# Patient Record
Sex: Male | Born: 1950 | Race: Black or African American | Hispanic: No | Marital: Single | Smoking: Current every day smoker
Health system: Southern US, Community
[De-identification: ages and names within clinical notes are randomized; demographics above are authoritative.]

## PROBLEM LIST (undated history)

## (undated) DIAGNOSIS — I96 Gangrene, not elsewhere classified: Secondary | ICD-10-CM

## (undated) DIAGNOSIS — Z7901 Long term (current) use of anticoagulants: Secondary | ICD-10-CM

## (undated) DIAGNOSIS — Z951 Presence of aortocoronary bypass graft: Secondary | ICD-10-CM

## (undated) DIAGNOSIS — I10 Essential (primary) hypertension: Secondary | ICD-10-CM

## (undated) DIAGNOSIS — I739 Peripheral vascular disease, unspecified: Secondary | ICD-10-CM

## (undated) DIAGNOSIS — M79672 Pain in left foot: Secondary | ICD-10-CM

## (undated) DIAGNOSIS — E119 Type 2 diabetes mellitus without complications: Secondary | ICD-10-CM

## (undated) DIAGNOSIS — I82512 Chronic embolism and thrombosis of left femoral vein: Secondary | ICD-10-CM

## (undated) DIAGNOSIS — N184 Chronic kidney disease, stage 4 (severe): Secondary | ICD-10-CM

## (undated) DIAGNOSIS — M216X2 Other acquired deformities of left foot: Secondary | ICD-10-CM

## (undated) DIAGNOSIS — E782 Mixed hyperlipidemia: Secondary | ICD-10-CM

## (undated) DIAGNOSIS — I2721 Secondary pulmonary arterial hypertension: Secondary | ICD-10-CM

## (undated) DIAGNOSIS — R0602 Shortness of breath: Secondary | ICD-10-CM

## (undated) DIAGNOSIS — R319 Hematuria, unspecified: Secondary | ICD-10-CM

## (undated) DIAGNOSIS — M1A0720 Idiopathic chronic gout, left ankle and foot, without tophus (tophi): Secondary | ICD-10-CM

## (undated) DIAGNOSIS — E1142 Type 2 diabetes mellitus with diabetic polyneuropathy: Secondary | ICD-10-CM

## (undated) DIAGNOSIS — L989 Disorder of the skin and subcutaneous tissue, unspecified: Secondary | ICD-10-CM

## (undated) DIAGNOSIS — Z87891 Personal history of nicotine dependence: Secondary | ICD-10-CM

## (undated) DIAGNOSIS — J9690 Respiratory failure, unspecified, unspecified whether with hypoxia or hypercapnia: Secondary | ICD-10-CM

## (undated) DIAGNOSIS — L84 Corns and callosities: Secondary | ICD-10-CM

## (undated) DIAGNOSIS — IMO0001 Reserved for inherently not codable concepts without codable children: Secondary | ICD-10-CM

## (undated) DIAGNOSIS — E11628 Type 2 diabetes mellitus with other skin complications: Secondary | ICD-10-CM

## (undated) DIAGNOSIS — J449 Chronic obstructive pulmonary disease, unspecified: Secondary | ICD-10-CM

## (undated) DIAGNOSIS — J9691 Respiratory failure, unspecified with hypoxia: Secondary | ICD-10-CM

## (undated) DIAGNOSIS — E118 Type 2 diabetes mellitus with unspecified complications: Secondary | ICD-10-CM

## (undated) DIAGNOSIS — I2510 Atherosclerotic heart disease of native coronary artery without angina pectoris: Secondary | ICD-10-CM

## (undated) DIAGNOSIS — C801 Malignant (primary) neoplasm, unspecified: Secondary | ICD-10-CM

## (undated) DIAGNOSIS — J90 Pleural effusion, not elsewhere classified: Secondary | ICD-10-CM

## (undated) DIAGNOSIS — J189 Pneumonia, unspecified organism: Secondary | ICD-10-CM

## (undated) DIAGNOSIS — N189 Chronic kidney disease, unspecified: Secondary | ICD-10-CM

## (undated) DIAGNOSIS — E039 Hypothyroidism, unspecified: Secondary | ICD-10-CM

## (undated) DIAGNOSIS — J9611 Chronic respiratory failure with hypoxia: Secondary | ICD-10-CM

## (undated) DIAGNOSIS — E7800 Pure hypercholesterolemia, unspecified: Secondary | ICD-10-CM

## (undated) DIAGNOSIS — I70269 Atherosclerosis of native arteries of extremities with gangrene, unspecified extremity: Secondary | ICD-10-CM

## (undated) DIAGNOSIS — D649 Anemia, unspecified: Secondary | ICD-10-CM

## (undated) DIAGNOSIS — D509 Iron deficiency anemia, unspecified: Secondary | ICD-10-CM

## (undated) DIAGNOSIS — I5033 Acute on chronic diastolic (congestive) heart failure: Secondary | ICD-10-CM

## (undated) DIAGNOSIS — D631 Anemia in chronic kidney disease: Secondary | ICD-10-CM

## (undated) DIAGNOSIS — I159 Secondary hypertension, unspecified: Secondary | ICD-10-CM

## (undated) DIAGNOSIS — I509 Heart failure, unspecified: Secondary | ICD-10-CM

## (undated) DIAGNOSIS — D472 Monoclonal gammopathy: Secondary | ICD-10-CM

## (undated) DIAGNOSIS — E11319 Type 2 diabetes mellitus with unspecified diabetic retinopathy without macular edema: Secondary | ICD-10-CM

## (undated) HISTORY — DX: Monoclonal gammopathy: D47.2

## (undated) HISTORY — PX: COLONOSCOPY W/ POLYPECTOMY: SHX1380

## (undated) HISTORY — DX: Essential (primary) hypertension: I10

## (undated) HISTORY — DX: Iron deficiency anemia, unspecified: D50.9

## (undated) HISTORY — PX: TONSILLECTOMY AND ADENOIDECTOMY: SUR1326

---

## 1981-02-03 HISTORY — PX: THROAT SURGERY: SHX803

## 2003-10-05 ENCOUNTER — Ambulatory Visit: Payer: Self-pay | Admitting: Nurse Practitioner

## 2003-10-12 ENCOUNTER — Ambulatory Visit: Payer: Self-pay | Admitting: *Deleted

## 2003-10-23 ENCOUNTER — Ambulatory Visit: Payer: Self-pay | Admitting: Nurse Practitioner

## 2003-11-27 ENCOUNTER — Ambulatory Visit: Payer: Self-pay | Admitting: Internal Medicine

## 2003-12-07 ENCOUNTER — Ambulatory Visit: Payer: Self-pay | Admitting: Nurse Practitioner

## 2004-03-12 ENCOUNTER — Ambulatory Visit: Payer: Self-pay | Admitting: Nurse Practitioner

## 2004-05-29 ENCOUNTER — Ambulatory Visit: Payer: Self-pay | Admitting: Nurse Practitioner

## 2004-08-28 ENCOUNTER — Ambulatory Visit: Payer: Self-pay | Admitting: Internal Medicine

## 2004-10-31 ENCOUNTER — Ambulatory Visit: Payer: Self-pay | Admitting: Nurse Practitioner

## 2004-11-18 ENCOUNTER — Ambulatory Visit: Payer: Self-pay | Admitting: Internal Medicine

## 2004-11-28 ENCOUNTER — Ambulatory Visit: Payer: Self-pay | Admitting: Nurse Practitioner

## 2005-03-05 ENCOUNTER — Ambulatory Visit: Payer: Self-pay | Admitting: Nurse Practitioner

## 2005-05-19 ENCOUNTER — Ambulatory Visit: Payer: Self-pay | Admitting: Internal Medicine

## 2005-08-18 ENCOUNTER — Ambulatory Visit: Payer: Self-pay | Admitting: Internal Medicine

## 2006-03-25 ENCOUNTER — Ambulatory Visit: Payer: Self-pay | Admitting: Nurse Practitioner

## 2006-10-21 ENCOUNTER — Encounter (INDEPENDENT_AMBULATORY_CARE_PROVIDER_SITE_OTHER): Payer: Self-pay | Admitting: *Deleted

## 2007-11-19 ENCOUNTER — Ambulatory Visit: Payer: Self-pay | Admitting: Internal Medicine

## 2007-11-23 ENCOUNTER — Encounter (INDEPENDENT_AMBULATORY_CARE_PROVIDER_SITE_OTHER): Payer: Self-pay | Admitting: Internal Medicine

## 2007-11-23 ENCOUNTER — Ambulatory Visit: Payer: Self-pay | Admitting: Internal Medicine

## 2007-11-23 LAB — CONVERTED CEMR LAB
AST: 16 units/L (ref 0–37)
Albumin: 5 g/dL (ref 3.5–5.2)
Alkaline Phosphatase: 91 units/L (ref 39–117)
BUN: 13 mg/dL (ref 6–23)
Basophils Relative: 0 % (ref 0–1)
Calcium: 9.8 mg/dL (ref 8.4–10.5)
Creatinine, Ser: 0.77 mg/dL (ref 0.40–1.50)
Eosinophils Absolute: 0 10*3/uL (ref 0.0–0.7)
Glucose, Bld: 289 mg/dL — ABNORMAL HIGH (ref 70–99)
HCT: 45.3 % (ref 39.0–52.0)
HDL: 40 mg/dL (ref >39)
Hemoglobin: 15 g/dL (ref 13.0–17.0)
LDL Cholesterol: 108 mg/dL — ABNORMAL HIGH (ref 0–99)
Lymphs Abs: 2.2 10*3/uL (ref 0.7–4.0)
MCHC: 33.1 g/dL (ref 30.0–36.0)
MCV: 85.2 fL (ref 78.0–100.0)
Monocytes Absolute: 0.3 10*3/uL (ref 0.1–1.0)
Monocytes Relative: 5 % (ref 3–12)
Potassium: 3.8 meq/L (ref 3.5–5.3)
RBC: 5.32 M/uL (ref 4.22–5.81)
TSH: 3.384 microintl units/mL (ref 0.350–4.50)
Total CHOL/HDL Ratio: 5.1
Triglycerides: 271 mg/dL — ABNORMAL HIGH (ref <150)
WBC: 5.8 10*3/uL (ref 4.0–10.5)

## 2007-11-26 ENCOUNTER — Ambulatory Visit: Payer: Self-pay | Admitting: Internal Medicine

## 2007-12-20 ENCOUNTER — Encounter (INDEPENDENT_AMBULATORY_CARE_PROVIDER_SITE_OTHER): Payer: Self-pay | Admitting: Internal Medicine

## 2007-12-20 ENCOUNTER — Ambulatory Visit: Payer: Self-pay | Admitting: Internal Medicine

## 2007-12-20 LAB — CONVERTED CEMR LAB
Amphetamine Screen, Ur: NEGATIVE
Cocaine Metabolites: NEGATIVE
Creatinine,U: 144 mg/dL
Microalb, Ur: 51.4 mg/dL — ABNORMAL HIGH (ref 0.00–1.89)
Phencyclidine (PCP): NEGATIVE

## 2007-12-22 ENCOUNTER — Ambulatory Visit: Payer: Self-pay | Admitting: Internal Medicine

## 2008-01-19 ENCOUNTER — Ambulatory Visit: Payer: Self-pay | Admitting: Internal Medicine

## 2008-03-22 ENCOUNTER — Ambulatory Visit: Payer: Self-pay | Admitting: Internal Medicine

## 2008-03-30 ENCOUNTER — Ambulatory Visit: Payer: Self-pay | Admitting: Internal Medicine

## 2008-06-26 ENCOUNTER — Ambulatory Visit: Payer: Self-pay | Admitting: Family Medicine

## 2008-06-27 ENCOUNTER — Ambulatory Visit: Payer: Self-pay | Admitting: Internal Medicine

## 2008-06-28 ENCOUNTER — Encounter: Payer: Self-pay | Admitting: Internal Medicine

## 2008-07-07 ENCOUNTER — Encounter: Payer: Self-pay | Admitting: Internal Medicine

## 2008-07-07 ENCOUNTER — Ambulatory Visit: Payer: Self-pay

## 2008-07-17 ENCOUNTER — Ambulatory Visit: Payer: Self-pay | Admitting: Cardiovascular Disease

## 2008-07-17 DIAGNOSIS — F17200 Nicotine dependence, unspecified, uncomplicated: Secondary | ICD-10-CM

## 2008-07-17 DIAGNOSIS — E782 Mixed hyperlipidemia: Secondary | ICD-10-CM

## 2008-07-17 HISTORY — DX: Mixed hyperlipidemia: E78.2

## 2008-08-16 ENCOUNTER — Ambulatory Visit: Payer: Self-pay | Admitting: Internal Medicine

## 2008-09-13 ENCOUNTER — Encounter (INDEPENDENT_AMBULATORY_CARE_PROVIDER_SITE_OTHER): Payer: Self-pay | Admitting: Internal Medicine

## 2008-09-13 ENCOUNTER — Ambulatory Visit: Payer: Self-pay | Admitting: Internal Medicine

## 2008-09-13 LAB — CONVERTED CEMR LAB: PSA: 0.2 ng/mL (ref 0.10–4.00)

## 2008-10-02 ENCOUNTER — Ambulatory Visit: Payer: Self-pay | Admitting: Internal Medicine

## 2008-12-08 ENCOUNTER — Encounter (INDEPENDENT_AMBULATORY_CARE_PROVIDER_SITE_OTHER): Payer: Self-pay | Admitting: Internal Medicine

## 2008-12-08 ENCOUNTER — Ambulatory Visit: Payer: Self-pay | Admitting: Internal Medicine

## 2008-12-08 LAB — CONVERTED CEMR LAB
HDL: 33 mg/dL — ABNORMAL LOW (ref >39)
Total CHOL/HDL Ratio: 4.3
Triglycerides: 196 mg/dL — ABNORMAL HIGH (ref <150)

## 2009-04-02 ENCOUNTER — Ambulatory Visit: Payer: Self-pay | Admitting: Internal Medicine

## 2009-05-21 ENCOUNTER — Ambulatory Visit: Payer: Self-pay | Admitting: Internal Medicine

## 2009-05-24 ENCOUNTER — Telehealth (INDEPENDENT_AMBULATORY_CARE_PROVIDER_SITE_OTHER): Payer: Self-pay | Admitting: *Deleted

## 2010-03-06 NOTE — Progress Notes (Signed)
Summary: records request  Records request received from the fax machine. Forwarded to [employer] for processing.

## 2012-01-06 ENCOUNTER — Encounter: Payer: Self-pay | Admitting: Family Medicine

## 2012-01-06 ENCOUNTER — Ambulatory Visit (INDEPENDENT_AMBULATORY_CARE_PROVIDER_SITE_OTHER): Payer: Medicaid Other | Admitting: Family Medicine

## 2012-01-06 VITALS — BP 172/82 | HR 72 | Temp 98.8°F | Ht 68.0 in | Wt 213.4 lb

## 2012-01-06 DIAGNOSIS — E1129 Type 2 diabetes mellitus with other diabetic kidney complication: Secondary | ICD-10-CM | POA: Insufficient documentation

## 2012-01-06 DIAGNOSIS — I119 Hypertensive heart disease without heart failure: Secondary | ICD-10-CM

## 2012-01-06 DIAGNOSIS — E119 Type 2 diabetes mellitus without complications: Secondary | ICD-10-CM

## 2012-01-06 MED ORDER — METFORMIN HCL 1000 MG PO TABS: 1000.0000 mg | ORAL_TABLET | Freq: Two times a day (BID) | ORAL | Status: DC

## 2012-01-06 MED ORDER — INSULIN GLARGINE 100 UNIT/ML ~~LOC~~ SOLN: 50.0000 [IU] | Freq: Every day | SUBCUTANEOUS | Status: DC

## 2012-01-06 MED ORDER — GLIPIZIDE 10 MG PO TABS: 10.0000 mg | ORAL_TABLET | Freq: Two times a day (BID) | ORAL | Status: DC

## 2012-01-06 MED ORDER — ATENOLOL 100 MG PO TABS: 100.0000 mg | ORAL_TABLET | Freq: Every day | ORAL | Status: DC

## 2012-01-06 MED ORDER — PRAVASTATIN SODIUM 40 MG PO TABS: 40.0000 mg | ORAL_TABLET | Freq: Every day | ORAL | Status: DC

## 2012-01-06 MED ORDER — LISINOPRIL 40 MG PO TABS: 40.0000 mg | ORAL_TABLET | Freq: Every day | ORAL | Status: DC

## 2012-01-06 MED ORDER — TRIAMTERENE-HCTZ 75-50 MG PO TABS: 1.0000 | ORAL_TABLET | Freq: Every day | ORAL | Status: DC

## 2012-01-06 MED ORDER — CLONIDINE HCL 0.2 MG PO TABS: 0.2000 mg | ORAL_TABLET | Freq: Two times a day (BID) | ORAL | Status: DC

## 2012-01-06 MED ORDER — AMLODIPINE BESYLATE 10 MG PO TABS: 10.0000 mg | ORAL_TABLET | Freq: Every day | ORAL | Status: DC

## 2012-01-06 NOTE — Patient Instructions (Addendum)
It was nice to meet you today, Leonard [name]. Your blood pressure was elevated today.  Remember to pick up your blood pressure medications. Schedule a follow up appointment with me in 3-4 weeks to discuss diabetes management and recheck blood pressure. In the meantime, try to avoid foods high in salt and sugar.  Read handout below.  Diet Recommendations for Diabetes   Starchy (carb) foods include: Bread, rice, pasta, potatoes, corn, crackers, bagels, muffins, all baked goods.   Protein foods include: Meat, fish, poultry, eggs, dairy foods, and beans such as pinto and kidney beans (beans also provide carbohydrate).   1. Eat at least 3 meals and 1-2 snacks per day. Never go more than 4-5 hours while awake without eating.  2. Limit starchy foods to TWO per meal and ONE per snack. ONE portion of a starchy  food is equal to the following:   - ONE slice of bread (or its equivalent, such as half of a hamburger bun).   - 1/2 cup of a "scoopable" starchy food such as potatoes or rice.   - 1 OUNCE (28 grams) of starchy snack foods such as crackers or pretzels (look on label).   - 15 grams of carbohydrate as shown on food label.  3. Both lunch and dinner should include a protein food, a carb food, and vegetables.   - Obtain twice as many veg's as protein or carbohydrate foods for both lunch and dinner.   - Try to keep frozen veg's on hand for a quick vegetable serving.     - Fresh or frozen veg's are best.  4. Breakfast should always include protein.    DASH Diet The DASH diet stands for "Dietary Approaches to Stop Hypertension." It is a healthy eating plan that has been shown to reduce high blood pressure (hypertension) in as little as 14 days, while also possibly providing other significant health benefits. These other health benefits include reducing the risk of breast cancer after menopause and reducing the risk of type 2 diabetes, heart disease, colon cancer, and stroke. Health benefits also include weight  loss and slowing kidney failure in patients with chronic kidney disease.  DIET GUIDELINES  Limit salt (sodium). Your diet should contain less than 1500 mg of sodium daily.  Limit refined or processed carbohydrates. Your diet should include mostly whole grains. Desserts and added sugars should be used sparingly.  Include small amounts of heart-healthy fats. These types of fats include nuts, oils, and tub margarine. Limit saturated and trans fats. These fats have been shown to be harmful in the body. CHOOSING FOODS  The following food groups are based on a 2000 calorie diet. See your Registered Dietitian for individual calorie needs. Grains and Grain Products (6 to 8 servings daily)  Eat More Often: Whole-wheat bread, brown rice, whole-grain or wheat pasta, quinoa, popcorn without added fat or salt (air popped).  Eat Less Often: White bread, white pasta, white rice, cornbread. Vegetables (4 to 5 servings daily)  Eat More Often: Fresh, frozen, and canned vegetables. Vegetables may be raw, steamed, roasted, or grilled with a minimal amount of fat.  Eat Less Often/Avoid: Creamed or fried vegetables. Vegetables in a cheese sauce. Fruit (4 to 5 servings daily)  Eat More Often: All fresh, canned (in natural juice), or frozen fruits. Dried fruits without added sugar. One hundred percent fruit juice ( cup [237 mL] daily).  Eat Less Often: Dried fruits with added sugar. Canned fruit in light or heavy syrup. [employer], Fish, and  Poultry (2 servings or less daily. One serving is 3 to 4 oz [85-114 g]).  Eat More Often: Ninety percent or leaner ground beef, tenderloin, sirloin. Round cuts of beef, chicken breast, [place] breast. All fish. Grill, bake, or broil your meat. Nothing should be fried.  Eat Less Often/Avoid: Fatty cuts of meat, [place], or chicken leg, thigh, or wing. Fried cuts of meat or fish. Dairy (2 to 3 servings)  Eat More Often: Low-fat or fat-free milk, low-fat plain or light  yogurt, reduced-fat or part-skim cheese.  Eat Less Often/Avoid: Milk (whole, 2%).Whole milk yogurt. Full-fat cheeses. Nuts, Seeds, and Legumes (4 to 5 servings per week)  Eat More Often: All without added salt.  Eat Less Often/Avoid: Salted nuts and seeds, canned beans with added salt. Fats and Sweets (limited)  Eat More Often: Vegetable oils, tub margarines without trans fats, sugar-free gelatin. Mayonnaise and salad dressings.  Eat Less Often/Avoid: Coconut oils, palm oils, butter, stick margarine, cream, half and half, cookies, Leonard [name], pie. FOR MORE INFORMATION The Dash Diet Eating Plan: www.dashdiet.org Document Released: 01/09/2011 Document Revised: 04/14/2011 Document Reviewed: 01/09/2011 [facility] Patient Information 2013 [place].

## 2012-01-06 NOTE — Assessment & Plan Note (Signed)
BP elevated in clinic today 172/82.  However, patient has been out of several of his BP medication since [facility] closed. - Will refill all BP medications today - Counseled patient on DASH diet - Recheck BP in 3-4 weeks

## 2012-01-06 NOTE — Assessment & Plan Note (Addendum)
Patient cannot recall last Hemoglobin A1c values and has been out of Metformin and Glipizide x 1 week. - Will review records from [place] - Refill DM medications today - Follow up in 3-4 weeks after he gets approval from [facility], will check A1c then - Counseled patient on CHO modified diet

## 2012-01-06 NOTE — Progress Notes (Signed)
  Subjective:    Patient ID: Leonard [name], male    DOB: August 17, 1950, 61 y.o.   MRN: [id number]  HPI  Patient presents to clinic to establish care.  Former Production manager.  Patient here for medication refills today.  He has been out of several medications x 1 week - he is specifically asking for Lisinopril and Metformin today.  BP elevated today 172/82.  He says he took a few BP medications this AM, but cannot remember which ones.  He says he has been out of Metformin x one week, but still taking Lantus and Glipizide.  Patient does not check CBG at home.  He also does not adhere to a CHO modified diet and cannot recall what his last Hemoglobin A1c was.  He did bring medical records to clinic today.    Patient needs to contact Medicaid case worker to change insurance to [facility].   I have reviewed and update patient's PMH, SH, FH, Sx Hx, Medications, Allergies, and Problem List.  Review of Systems  Patient denies any HA, visual changes, nausea/vomiting, fevers, weight loss, or paresthesias of hands or feet.     Objective:   Physical Exam  Constitutional: No distress.  HENT:  Head: Normocephalic and atraumatic.  Neck: Normal range of motion. Neck supple.  Cardiovascular: Normal rate and regular rhythm.   Pulmonary/Chest: Effort normal and breath sounds normal. He has no wheezes. He has no rales.  Neurological: No cranial nerve deficit.          Assessment & Plan:

## 2012-01-19 ENCOUNTER — Other Ambulatory Visit: Payer: Self-pay | Admitting: *Deleted

## 2012-01-19 MED ORDER — INSULIN GLARGINE 100 UNIT/ML ~~LOC~~ SOLN: 50.0000 [IU] | Freq: Every day | SUBCUTANEOUS | Status: DC

## 2012-04-05 ENCOUNTER — Other Ambulatory Visit: Payer: Self-pay | Admitting: *Deleted

## 2012-04-05 MED ORDER — AMLODIPINE BESYLATE 10 MG PO TABS: 10.0000 mg | ORAL_TABLET | Freq: Every day | ORAL | Status: DC

## 2012-05-15 ENCOUNTER — Other Ambulatory Visit: Payer: Self-pay | Admitting: Family Medicine

## 2012-07-01 ENCOUNTER — Other Ambulatory Visit: Payer: Self-pay | Admitting: Family Medicine

## 2012-07-30 ENCOUNTER — Telehealth: Payer: Self-pay | Admitting: Family Medicine

## 2012-07-30 NOTE — Telephone Encounter (Signed)
Please let patient know what a copy of his MR are at front desk if he wants to pick them up.

## 2012-07-30 NOTE — Telephone Encounter (Signed)
Pt informed.  Leonard [name], Leonard [name], [place]

## 2012-08-05 ENCOUNTER — Telehealth: Payer: Self-pay | Admitting: Family Medicine

## 2012-08-05 ENCOUNTER — Other Ambulatory Visit: Payer: Self-pay | Admitting: Family Medicine

## 2012-08-05 NOTE — Telephone Encounter (Signed)
Pt has come in stating that he is out off medicine and need prescriptions sent to the pharmacy.

## 2012-08-05 NOTE — Telephone Encounter (Signed)
Spoke with patient's wife and informed her to contact pharmacy about the refills

## 2012-08-09 ENCOUNTER — Encounter: Payer: Self-pay | Admitting: Family Medicine

## 2012-08-09 ENCOUNTER — Ambulatory Visit (INDEPENDENT_AMBULATORY_CARE_PROVIDER_SITE_OTHER): Payer: Medicaid Other | Admitting: Family Medicine

## 2012-08-09 VITALS — BP 198/84 | HR 84 | Temp 99.1°F | Ht 68.0 in | Wt 214.1 lb

## 2012-08-09 DIAGNOSIS — I119 Hypertensive heart disease without heart failure: Secondary | ICD-10-CM

## 2012-08-09 DIAGNOSIS — E119 Type 2 diabetes mellitus without complications: Secondary | ICD-10-CM

## 2012-08-09 LAB — GLUCOSE, CAPILLARY: Glucose-Capillary: 226 mg/dL — ABNORMAL HIGH (ref 70–99)

## 2012-08-09 MED ORDER — CLONIDINE HCL 0.2 MG PO TABS: 0.2000 mg | ORAL_TABLET | Freq: Two times a day (BID) | ORAL | Status: DC

## 2012-08-09 MED ORDER — AMLODIPINE BESYLATE 10 MG PO TABS: 10.0000 mg | ORAL_TABLET | Freq: Every day | ORAL | Status: DC

## 2012-08-09 MED ORDER — LISINOPRIL 40 MG PO TABS: 40.0000 mg | ORAL_TABLET | Freq: Every day | ORAL | Status: DC

## 2012-08-09 MED ORDER — ATENOLOL 100 MG PO TABS: ORAL_TABLET | ORAL | Status: DC

## 2012-08-09 MED ORDER — GLIPIZIDE 10 MG PO TABS: 10.0000 mg | ORAL_TABLET | Freq: Two times a day (BID) | ORAL | Status: DC

## 2012-08-09 MED ORDER — TRIAMTERENE-HCTZ 75-50 MG PO TABS: ORAL_TABLET | ORAL | Status: DC

## 2012-08-09 NOTE — Progress Notes (Signed)
Patient ID: Leonard [name], male   DOB: [birth year], 62 y.o.   MRN: [id number]   [facility] Garret Reddish, MD Phone: [phone]  Subjective:    Type 2 diabetes:  1.  He states that he has been complient with his medications. He said he ran out of his glipizide this morning. He does not check his blood sugar. His blood sugar today in clinic was 226 and his hemoglobin A1c was 12.1. He states that he maintain a healthy diet. Patient wants his medications refilled.    HTN:  2. The patient says that he has been taking his medications for HTN. Today his blood pressure was 198/84. He has not had any swelling, headaches, but has had vision problems. Patient wants his medications refilled.    Vision changes 3. He states that he has had recent vision changes in the past month. They occur during the day and night. He has seen an opthalmologist in the past but has not kept up with it.   ROS--See HPI  Past Medical History Patient Active Problem List   Diagnosis Date Noted  . Diabetes mellitus, type 2 01/06/2012  . MIXED HYPERLIPIDEMIA 07/17/2008  . TOBACCO ABUSE 07/17/2008  . HYPERTENSION, HEART UNCONTROLLED W/O ASSOC CHF 07/17/2008  . HYPERTENSION, HEART CONTROLLED W/O ASSOC CHF 07/17/2008   Reviewed problem list.  Medications- reviewed and updated Chief complaint-noted  Objective: BP 198/84  Pulse 84  Temp(Src) 99.1 F (37.3 C) (Oral)  Ht 5\' 8"  (1.727 m)  Wt 214 lb 1.6 oz (97.115 kg)  BMI 32.56 kg/m2   Gen: NAD, resting comfortably CV: RRR no murmurs rubs or gallops Lungs: CTAB no crackles, wheeze, rhonchi Skin: warm, dry Neuro: grossly normal, moves all extremities  Assessment/Plan:  1. All of the patients diabetic meds were refilled. Decided not to change the dosage of any of his medications because compliance is an issue. I told him to check his blood sugar and keep a record.  We will do this for a month and he will schedule a return visit. I  prescribed him an accu-chick with strips so he can monitor his blood sugar at home. The patient could benefit from diabetic education with Dr. Valentina Lucks so that is something we could do in the future.   2. I refilled all of his HTN medications.  The dosages were not changed because I don't know his level of compliance.  I encouraged a return visit in a month so we'll be able to recheck his BP and also be able to check his fasting labs.  CMET, CBC, Fasting lipids.   3. He was referred to an opthalmologist so he can get his vision checked.

## 2012-08-09 NOTE — Patient Instructions (Addendum)
I have refilled all of your prescriptions and referred you to get your eyes checked.   You need to check your blood sugar before meals and I have ordered an Accu check system so that you may do that. Your blood sugar today was 226 and your A1C (average over the past 3 months) was 12.1.   Please take your medication as directed and schedule a f/u visit within a month so we can recheck your blood sugar and your blood pressure so we can make any adjustments if necessary. We will perform labs on your return visit so make sure that you are fasting when you come in.    Thank you and have a nice day,  Clearance Coots, MD   For your diet:   Exercise such that you sweat some and your heart rate goes up most days of the week.  4Water, water, water  Check blood sugar 2-3 x per week to get a feel for how different foods effect your blood sugar:  Goal fasting <100  Goal after eating < 200  Diet Recommendations for Diabetes   Starchy (carb) foods include: Bread, rice, pasta, potatoes, corn, crackers, bagels, muffins, all baked goods.   Protein foods include: Meat, fish, poultry, eggs, dairy foods, and beans such as pinto and kidney beans (beans also provide carbohydrate).   1. Eat at least 3 meals and 1-2 snacks per day. Never go more than 4-5 hours while awake without eating.  2. Limit starchy foods to TWO per meal and ONE per snack. ONE portion of a starchy  food is equal to the following:   - ONE slice of bread (or its equivalent, such as half of a hamburger bun).   - 1/2 cup of a "scoopable" starchy food such as potatoes or rice.   - 1 OUNCE (28 grams) of starchy snack foods such as crackers or pretzels (look on label).   - 15 grams of carbohydrate as shown on food label.  3. Both lunch and dinner should include a protein food, a carb food, and vegetables.   - Obtain twice as many veg's as protein or carbohydrate foods for both lunch and dinner.   - Try to keep frozen veg's on hand for a quick  vegetable serving.     - Fresh or frozen veg's are best.  4. Breakfast should always include protein.    Diet Recommendations for Diabetes   Starchy (carb) foods include: Bread, rice, pasta, potatoes, corn, crackers, bagels, muffins, all baked goods.   Protein foods include: Meat, fish, poultry, eggs, dairy foods, and beans such as pinto and kidney beans (beans also provide carbohydrate).   1. Eat at least 3 meals and 1-2 snacks per day. Never go more than 4-5 hours while awake without eating.  2. Limit starchy foods to TWO per meal and ONE per snack. ONE portion of a starchy  food is equal to the following:   - ONE slice of bread (or its equivalent, such as half of a hamburger bun).   - 1/2 cup of a "scoopable" starchy food such as potatoes or rice.   - 1 OUNCE (28 grams) of starchy snack foods such as crackers or pretzels (look on label).   - 15 grams of carbohydrate as shown on food label.  3. Both lunch and dinner should include a protein food, a carb food, and vegetables.   - Obtain twice as many veg's as protein or carbohydrate foods for both lunch and dinner.   -  Try to keep frozen veg's on hand for a quick vegetable serving.     - Fresh or frozen veg's are best.  4. Breakfast should always include protein.

## 2012-09-07 ENCOUNTER — Other Ambulatory Visit: Payer: Self-pay | Admitting: Family Medicine

## 2012-09-07 DIAGNOSIS — E876 Hypokalemia: Secondary | ICD-10-CM

## 2012-09-22 ENCOUNTER — Encounter: Payer: Medicaid Other | Attending: Family Medicine | Admitting: *Deleted

## 2012-09-22 ENCOUNTER — Encounter: Payer: Self-pay | Admitting: *Deleted

## 2012-09-22 VITALS — Ht 68.0 in | Wt 213.7 lb

## 2012-09-22 DIAGNOSIS — Z713 Dietary counseling and surveillance: Secondary | ICD-10-CM | POA: Insufficient documentation

## 2012-09-22 DIAGNOSIS — E119 Type 2 diabetes mellitus without complications: Secondary | ICD-10-CM | POA: Insufficient documentation

## 2012-09-22 NOTE — Addendum Note (Signed)
Addended by: Rosemarie Ax on: 09/22/2012 02:19 PM   Modules accepted: Orders

## 2012-09-22 NOTE — Progress Notes (Signed)
Medical Nutrition Therapy:  Appt start time:  900   End time: 1000  Assessment:  Patient was seen on  09/22/2012 for individual diabetes education. Had education years ago and does not recall much. Knows to "keep carbohydrates down". States his sister does the cooking and recall reveals trend of excessive intake of fried/high fat foods and CHO. Is not checking blood glucose and reports he does not own a meter.  Will request referral from MD for BGM. Followed by foot doctor and gave referral to [facility] for diabetic eye exam.  Extremely distracted during appointment and appeared eager to leave, though I believe he will return for meter instruction. Smokes cigarettes (1 ppd) and states interest in quitting.  Asks for prescription "or something to help me quit". Referred back to MD for Rx.   DM Self-Mgmt Assessment 09/22/2012  Time since Diabetes Dx 13 years  Type of Diabetes Type 2  Previous DM Education? Yes  Location of education [employer] via [place]  Work/school missed in year for illness On disability  Personal health rating (1 = best) 3  How often is MD seen for diabetes? Every 3 months  Hospitalizations in past year 0  Do you check your glucose? No  Do you check your feet? Yes  How often? Sees Dr. Blenda Mounts at [facility]; Daily at home  Do you exercise? Yes  How often? Walks 30-60 min most days  Do you take aspirin? Yes  How often? Daily  Do you use alcohol? No  Do you use tobacco? Yes  How often? 1 ppd (cigarettes) - states interest to quit  Eye exam within a year? No  Do you follow a meal plan? No   Current HbA1c: 12.1% (08/09/12)  MEDICATIONS: See med list. Reconciled with patient at visit.   DIETARY INTAKE:  Usual eating pattern includes 2-3 meals and 0-1 snacks per day.  24-hr recall:  B (10 AM):  Fried chicken (1 leg), 1 pc bologna w/ 1 pc white bread; diet orange soda  Snk (AM): NONE  L (2-3 PM): Cubed steak, 1 cup creamed potatoes Snk (PM): NONE D  (PM): Ham, cabbage, beans Snk (PM): Cookies (4 ea) med-sized, chocolate chip Beverages: Diet soda  Estimated energy needs: 1600 calories 180 g carbohydrates  Progress Towards Goal(s):  In progress.   Nutritional Diagnosis:  [place]-2.1 Impaired nutrient utilization related to glucose metabolism as evidenced by recent A1c of 12% and MD referral for MNT/DSME.    Intervention:  Nutrition counseling provided.  Provided education on macronutrients on glucose levels.  Provided education on carb counting, importance of regularly scheduled meals/snacks, and meal planning  Discussed effects of physical activity on glucose levels and long-term glucose control.  Recommended 45-60 minutes of physical activity/day.  Reviewed patient medications.  Discussed role of medication on blood glucose and possible side effects  Discussed blood glucose monitoring and interpretation.  Discussed recommended target ranges and individual ranges.    Described short-term complications: hyper- and hypo-glycemia.  Discussed causes,symptoms, and treatment options.  Discussed prevention, detection, and treatment of long-term complications.  Discussed the role of prolonged elevated glucose levels on body systems.  Discussed recommendations for long-term diabetes self-care.  Established checklist for medical, dental, and emotional self-care.  Not covered d/t time constraints/patient education level. Will cover at f/u:   Discussed diabetes disease process and treatment options.  Discussed physiology of diabetes and role of obesity on insulin resistance.  Encouraged moderate weight reduction to improve glucose levels.  Discussed  role of medications and diet in glucose control.  Discussed role of stress on blood glucose levels and discussed strategies to manage psychosocial issues.  Will provide continued education on carb counting  Handouts given during visit include:  Living Well with Diabetes book ([employer])  Target  Blood Glucose Levels  45g CHO Meal Plan  Yellow Meal Planning card  Barriers to learning/adherance to lifestyle change:  Education level, transportation, Socioeconomic status  Diabetes self-care support plan:  [facility] support group  Referral from MD for BGM  Monitoring/Evaluation:  Dietary intake, exercise, A1c/BG trends, and body weight in 4 week(s).

## 2012-09-22 NOTE — Patient Instructions (Addendum)
Goals:  Follow Diabetes Meal Plan as instructed  Eat 3 meals and 2 snacks, every 3-5 hrs  Limit carbohydrate intake to 45 grams carbohydrate/meal  Limit carbohydrate intake to 15 grams carbohydrate/snack  Add lean protein foods to meals/snacks  Monitor glucose levels as instructed by your doctor  Aim for 60 mins of physical activity daily

## 2012-10-12 ENCOUNTER — Encounter: Payer: Medicaid Other | Attending: Family Medicine | Admitting: *Deleted

## 2012-10-12 ENCOUNTER — Encounter: Payer: Self-pay | Admitting: *Deleted

## 2012-10-12 DIAGNOSIS — Z713 Dietary counseling and surveillance: Secondary | ICD-10-CM | POA: Insufficient documentation

## 2012-10-12 DIAGNOSIS — E119 Type 2 diabetes mellitus without complications: Secondary | ICD-10-CM | POA: Insufficient documentation

## 2012-10-12 NOTE — Progress Notes (Addendum)
Appt start time: [id number] end time:  [id number].  Assessment:  Patient seen 10/12/12 for continued diabetes education and instruction on Blood Glucose Monitoring with teach back method. Patient noted that he had not made any dietary modifications since his initial visit with the dietitian. I noted that Dr. Raeford Razor wanted to see him back at [facility] one month after he started the dietary modifications and glucose testing. He agreed to begin modifications and testing.  Meter Provided: Yes  If Yes, Brand: Accu Chek Nano BG Monitoring Kit Lot #: [id number] Expiration Date: 01/02/14     Intervention:     Explained rationale of testing BG to obtain data as to how their diabetes is being managed.  Provided Target Ranges for both pre and post meals FBS <100, 2hpp <200  Explained factors that effect BG including food (carbohydrate), stress, activity level and insulin availability in the body including diabetes medications  Taught patient techniques for using BG monitor and lancing device  Discussed need for Rx for strips and lancets (I will call family practice)   Explained rationale of recording BG both for patient and MD to assess patterns as needed.  Encouraged moderate weight reduction to improve glucose levels.  Discussed role of medications and diet in glucose control  Provided education on macronutrients on glucose levels.  Provided education on carb counting, importance of regularly scheduled meals/snacks, and meal planning  Discussed effects of physical activity on glucose levels and long-term glucose control.  Recommended 150 minutes of physical activity/week.  Discussed role of stress on blood glucose levels and discussed strategies to manage psychosocial issues.  Handouts given during visit include:  Snack sheet  Follow Up: Patient to follow up with [facility] in one month.   10/15/12 12:26pm Left msg on recording of family practice [phone] request order to be called in to [place]. Test strips and FastClix lancets for 4Xdaily testing.

## 2012-10-18 ENCOUNTER — Other Ambulatory Visit: Payer: Self-pay | Admitting: *Deleted

## 2012-10-18 MED ORDER — ACCU-CHEK FASTCLIX LANCET KIT: 1.0000 | PACK | Freq: Four times a day (QID) | Status: DC

## 2012-10-22 ENCOUNTER — Telehealth: Payer: Self-pay | Admitting: Family Medicine

## 2012-10-22 NOTE — Telephone Encounter (Signed)
Pt dropped off paper work to be filled out concerning diabetic shoes.

## 2012-10-26 ENCOUNTER — Ambulatory Visit: Payer: Medicaid Other | Admitting: *Deleted

## 2012-11-05 ENCOUNTER — Other Ambulatory Visit: Payer: Self-pay | Admitting: Family Medicine

## 2012-11-05 DIAGNOSIS — E119 Type 2 diabetes mellitus without complications: Secondary | ICD-10-CM

## 2012-11-11 MED ORDER — GLIPIZIDE 10 MG PO TABS: 10.0000 mg | ORAL_TABLET | Freq: Two times a day (BID) | ORAL | Status: DC

## 2012-11-11 NOTE — Telephone Encounter (Signed)
Will refill Leonard [name]'s glipizide but have yet to see him back since our initial meeting. Follow up is a concern so will refill since I would not want him to run out of his medication.

## 2012-11-16 ENCOUNTER — Ambulatory Visit (INDEPENDENT_AMBULATORY_CARE_PROVIDER_SITE_OTHER): Payer: Medicaid Other

## 2012-11-16 VITALS — BP 186/102 | HR 96 | Resp 12 | Ht 68.0 in | Wt 213.0 lb

## 2012-11-16 DIAGNOSIS — E1140 Type 2 diabetes mellitus with diabetic neuropathy, unspecified: Secondary | ICD-10-CM

## 2012-11-16 DIAGNOSIS — E1142 Type 2 diabetes mellitus with diabetic polyneuropathy: Secondary | ICD-10-CM

## 2012-11-16 DIAGNOSIS — L97509 Non-pressure chronic ulcer of other part of unspecified foot with unspecified severity: Secondary | ICD-10-CM

## 2012-11-16 DIAGNOSIS — E1149 Type 2 diabetes mellitus with other diabetic neurological complication: Secondary | ICD-10-CM

## 2012-11-16 MED ORDER — CEPHALEXIN 500 MG PO CAPS
500.0000 mg | ORAL_CAPSULE | Freq: Four times a day (QID) | ORAL | Status: DC
Start: 2012-11-16 — End: 2013-01-04

## 2012-11-16 NOTE — Patient Instructions (Signed)
ANTIBACTERIAL SOAP INSTRUCTIONS  THE DAY AFTER PROCEDURE  Please follow the instructions your doctor has marked.   Shower as usual. Before getting out, place a drop of antibacterial liquid soap (Dial) on a wet, clean washcloth.  Gently wipe washcloth over affected area.  Afterward, rinse the area with warm water.  Blot the area dry with a soft cloth and cover with antibiotic ointment (neosporin, polysporin, bacitracin) and band aid or gauze and tape Place 3-4 drops of antibacterial liquid soap in a quart of warm tap water.  Submerge foot into water for 20 minutes.  If bandage was applied after your procedure, leave on to allow for easy lift off, then remove and continue with soak for the remaining time.  Next, blot area dry with a soft cloth and cover with a bandage.  Apply other medications as directed by your doctor, such as cortisporin otic solution (eardrops) or neosporin antibiotic ointment     Diabetes and Foot Care Diabetes may cause you to have a poor blood supply (circulation) to your legs and feet. Because of this, the skin may be thinner, break easier, and heal more slowly. You also may have nerve damage in your legs and feet causing decreased feeling. You may not notice minor injuries to your feet that could lead to serious problems or infections. Taking care of your feet is one of the most important things you can do for yourself.  HOME CARE INSTRUCTIONS  Do not go barefoot. Bare feet are easily injured.  Check your feet daily for blisters, cuts, and redness.  Wash your feet with warm water (not hot) and mild soap. Pat your feet and between your toes until completely dry.  Apply a moisturizing lotion that does not contain alcohol or petroleum jelly to the dry skin on your feet and to dry brittle toenails. Do not put it between your toes.  Trim your toenails straight across. Do not dig under them or around the cuticle.  Do not cut corns or calluses, or try to remove them  with medicine.  Wear clean cotton socks or stockings every day. Make sure they are not too tight. Do not wear knee high stockings since they may decrease blood flow to your legs.  Wear leather shoes that fit properly and have enough cushioning. To break in new shoes, wear them just a few hours a day to avoid injuring your feet.  Wear shoes at all times, even in the house.  Do not cross your legs. This may decrease the blood flow to your feet.  If you find a minor scrape, cut, or break in the skin on your feet, keep it and the skin around it clean and dry. These areas may be cleansed with mild soap and water. Do not use peroxide, alcohol, iodine or Merthiolate.  When you remove an adhesive bandage, be sure not to harm the skin around it.  If you have a wound, look at it several times a day to make sure it is healing.  Do not use heating pads or hot water bottles. Burns can occur. If you have lost feeling in your feet or legs, you may not know it is happening until it is too late.  Report any cuts, sores or bruises to your caregiver. Do not wait! SEEK MEDICAL CARE IF:   You have an injury that is not healing or you notice redness, numbness, burning, or tingling.  Your feet always feel cold.  You have pain or cramps in  your legs and feet. SEEK IMMEDIATE MEDICAL CARE IF:   There is increasing redness, swelling, or increasing pain in the wound.  There is a red line that goes up your leg.  Pus is coming from a wound.  You develop an unexplained oral temperature above 102 F (38.9 C), or as your caregiver suggests.  You notice a bad smell coming from an ulcer or wound. MAKE SURE YOU:   Understand these instructions.  Will watch your condition.  Will get help right away if you are not doing well or get worse. Document Released: 01/18/2000 Document Revised: 04/14/2011 Document Reviewed: 07/26/2008 [facility] Patient Information 2014 [place], [place].

## 2012-11-16 NOTE — Progress Notes (Signed)
  Subjective:    Patient ID: Leonard [name], male    DOB: 06/28/1950, 62 y.o.   MRN: [id number]  HPI Comments: '' THE RT. FOOT HAVE DRAINIGE AND IT OPEN BACK UP''  bursa drainage and odor coming from the ulcer sub-first MTP area right foot. This of them going on for a couple of weeks. Patient was also examined today his blood pressure is significantly elevated. Apparently  has run out of his medications and is scheduled for followup with his primary physician. Advised to keep his appointment with him next week or so with his primary physician, and to contact pharmacy to request a refill of his blood pressure medications with his primary physician.   Review of Systems  Constitutional: Negative.   HENT: Negative.   Eyes: Negative.   Respiratory: Negative.   Cardiovascular: Negative.   Musculoskeletal: Negative.   Skin: Negative.   Allergic/Immunologic: Negative.   Neurological: Negative.   Hematological: Negative.   Psychiatric/Behavioral: Negative.        Objective:   Physical Exam  Constitutional: He is oriented to person, place, and time. He appears well-developed and well-nourished.  Cardiovascular:  Pulses:      Dorsalis pedis pulses are 2+ on the right side, and 2+ on the left side.       Posterior tibial pulses are 0 on the right side, and 1+ on the left side.  Capillary refill 3-4 seconds all digits. Skin temperature warm. No varicosities noted.  Musculoskeletal:  Rectus foot type bilateral. Semirigid digital contractures 2 through 5 bilateral.  Neurological: He is alert and oriented to person, place, and time. He has normal strength. He displays abnormal reflex.  Profoundly decreased epicritic and proprioceptive sensations bilateral DTRs not elicited. There is normal plantar response.  Skin: Skin is warm. No cyanosis. Nails show no clubbing.  Nails thick friable criptotic with discoloration darkening and brittleness. X10.  Ulceration sub-fifth MP. Down to dermal level  0.5 cm in diameter with hemorrhage a keratosis surrounding the periphery. There is a larger ulcer proximally 1.4 x 1.0 cm sub-first MTP area right. Proximally 0.5 cm in depth with a pink granular base down to subcutaneous tissue level. Prior to debridement of the ulcer there is maceration and malodor.  Psychiatric: He has a normal mood and affect. His behavior is normal.          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 The ulcers are debrided to the subcutaneous tissue sub-one dermal level sub-5. Iodosorb and gauze dressings are applied. Prescription for cephalexin is called in to pharmacy. 500 mg 4 times a day x10 days. Patient will continue with Silvadene and gauze dressings daily as and instructed. Recheck in 2-3 weeks for followup. Contacted in changes or exacerbations or fever and chills.  Harriet Masson DPM

## 2012-11-25 ENCOUNTER — Ambulatory Visit (INDEPENDENT_AMBULATORY_CARE_PROVIDER_SITE_OTHER): Payer: Medicaid Other | Admitting: Family Medicine

## 2012-11-25 ENCOUNTER — Encounter: Payer: Self-pay | Admitting: Family Medicine

## 2012-11-25 ENCOUNTER — Ambulatory Visit (HOSPITAL_COMMUNITY)
Admission: RE | Admit: 2012-11-25 | Discharge: 2012-11-25 | Disposition: A | Discharge status: Home / Self Care | Payer: Medicaid Other | Source: Ambulatory Visit | Attending: Family Medicine | Admitting: Family Medicine

## 2012-11-25 VITALS — BP 190/108 | HR 80 | Ht 68.0 in | Wt 213.0 lb

## 2012-11-25 DIAGNOSIS — I10 Essential (primary) hypertension: Secondary | ICD-10-CM

## 2012-11-25 DIAGNOSIS — H538 Other visual disturbances: Secondary | ICD-10-CM

## 2012-11-25 DIAGNOSIS — I119 Hypertensive heart disease without heart failure: Secondary | ICD-10-CM

## 2012-11-25 DIAGNOSIS — I1A0 Resistant hypertension: Secondary | ICD-10-CM | POA: Insufficient documentation

## 2012-11-25 DIAGNOSIS — E119 Type 2 diabetes mellitus without complications: Secondary | ICD-10-CM

## 2012-11-25 DIAGNOSIS — F17200 Nicotine dependence, unspecified, uncomplicated: Secondary | ICD-10-CM

## 2012-11-25 DIAGNOSIS — E782 Mixed hyperlipidemia: Secondary | ICD-10-CM

## 2012-11-25 DIAGNOSIS — Z23 Encounter for immunization: Secondary | ICD-10-CM

## 2012-11-25 HISTORY — DX: Resistant hypertension: I1A.0

## 2012-11-25 HISTORY — DX: Essential (primary) hypertension: I10

## 2012-11-25 LAB — LIPID PANEL
Cholesterol: 147 mg/dL (ref 0–200)
LDL Cholesterol: 40 mg/dL (ref 0–99)
Total CHOL/HDL Ratio: 5.4 Ratio
Triglycerides: 400 mg/dL — ABNORMAL HIGH (ref <150)
VLDL: 80 mg/dL — ABNORMAL HIGH (ref 0–40)

## 2012-11-25 LAB — COMPREHENSIVE METABOLIC PANEL
ALT: 29 U/L (ref 0–53)
AST: 23 U/L (ref 0–37)
Albumin: 4.3 g/dL (ref 3.5–5.2)
Alkaline Phosphatase: 72 U/L (ref 39–117)
BUN: 16 mg/dL (ref 6–23)
CO2: 27 mEq/L (ref 19–32)
Calcium: 9.6 mg/dL (ref 8.4–10.5)
Chloride: 104 mEq/L (ref 96–112)
Creat: 0.98 mg/dL (ref 0.50–1.35)
Potassium: 4.2 mEq/L (ref 3.5–5.3)
Total Protein: 6.7 g/dL (ref 6.0–8.3)

## 2012-11-25 MED ORDER — TRIAMTERENE-HCTZ 75-50 MG PO TABS: ORAL_TABLET | ORAL | Status: DC

## 2012-11-25 MED ORDER — GLIPIZIDE 10 MG PO TABS: 10.0000 mg | ORAL_TABLET | Freq: Two times a day (BID) | ORAL | Status: DC

## 2012-11-25 MED ORDER — PRAVASTATIN SODIUM 40 MG PO TABS: 40.0000 mg | ORAL_TABLET | Freq: Every day | ORAL | Status: DC

## 2012-11-25 MED ORDER — CLONIDINE HCL 0.2 MG PO TABS: 0.2000 mg | ORAL_TABLET | Freq: Two times a day (BID) | ORAL | Status: DC

## 2012-11-25 MED ORDER — INSULIN GLARGINE 100 UNIT/ML ~~LOC~~ SOLN: 50.0000 [IU] | Freq: Every day | SUBCUTANEOUS | Status: DC

## 2012-11-25 MED ORDER — AMLODIPINE BESYLATE 10 MG PO TABS: 10.0000 mg | ORAL_TABLET | Freq: Every day | ORAL | Status: DC

## 2012-11-25 MED ORDER — ATENOLOL 100 MG PO TABS: ORAL_TABLET | ORAL | Status: DC

## 2012-11-25 MED ORDER — METFORMIN HCL 1000 MG PO TABS: 1000.0000 mg | ORAL_TABLET | Freq: Two times a day (BID) | ORAL | Status: DC

## 2012-11-25 NOTE — Assessment & Plan Note (Signed)
Will attain a lipid panel  - refill his medication  - will inform him of the results and any changes

## 2012-11-25 NOTE — Patient Instructions (Signed)
Thank you for coming in,   Please take your medications as directed. I have refilled all of your medications. Please schedule an appointment with me prior to your medications running out. This is important due to your elevated blood sugars and high blood pressure.   Please check your blood sugars three times a day before meals. It is important because this will determine if we need to change your medication.  Please bring your blood sugar measurements in with you the next time.    It is also important to check your blood pressure once a week. We may need to change your blood pressure medications.   I will call you with your lab results, eye exam and of your EKG.   Please think about quitting smoking.  This is very important for your health.  There are many things we can do to help you quit.   You can also call 1-800-QUIT-NOW (504)127-7363) for free smoking cessation counseling. For right now I would advise using the gum or the patch. These two things can be found over the counter. If these two things don't work then we can try a different method.     Please feel free to call with any questions or concerns at any time, at [phone]. --Dr. Raeford Razor

## 2012-11-25 NOTE — Assessment & Plan Note (Signed)
A1C: 9.6 which is a decrease from 12.1 in July. Non compliant with blood sugar measurements. Is compliant with medications if he has them.  - faxed form for prior authorization of diabetic shoes and insoles - confirmed referral to podiatrist for further ulcer and foot management.  - already referred to diabetic education and counseling in July. He doesn't seem interested in managing his diabetes.  - refilled his medications.  - expressed the need to check blood sugars three times a day before meals and bring theses measurements at next visit.  - follow up in three months.

## 2012-11-25 NOTE — Assessment & Plan Note (Signed)
BP 190/102. He ran out of his medication. Not interested in check his blood pressure.  - refilled his medications  - EKG performed today and is NSR, has not had one since 2009  - CMET today: on lisinopril and hasn't been checked in over a year.  - consider referral to Dr. Graylin [facility] for ambulatory BP monitoring but doubt this will happen due to compliance issues and lack of follow up.

## 2012-11-25 NOTE — Progress Notes (Signed)
Subjective:     Patient ID: Leonard [name], male   DOB: 1950/10/12, 62 y.o.   MRN: [id number]  HPI  1. HTN: BP is elevated today. He ran out of his medications recently. He walks routinely. He doesn't check his blood pressure at all. He doesn't follow a diet. He reports no shortness of breath, chest pain or headaches.   2. DM: His Hgb A1c 9.6 down from 12.1 in July. He has ran out of his medication and has not taken them recently. He was sent for diabetic couseling and teaching. He went to two classes but still does not check his blood sugar at all. He understands that he needs to do this but is not compliant. He reports no foot pain or paresthesia. He denies frequent urination or increased fatigue. He is also requesting a prior authorization for prior approval for diabetic shoes and insoles. He does not report any problems with walking but does report a ulcer on the bottom of his foot.  He has recently seen a podiatrist that prescribed him keflex and sulfasalazine.  He reports no drainage from the site.   3. Smoking cessation: he wants to quit smoking. He has been a smoker since he was 62 year old. He smokes a PPD. He thinks that it is bad for his health. He doesn't have a quit date but wants to quit. He has not tried any methods to quit in terms of gum or patch.   4. HLD: patient is out of his statin medication. He has not taken it in a couple of weeks.   Review of Systems Positive for blurry vision but all other systems reviewed and otherwise normal.     Objective:   Physical Exam BP 190/108  Pulse 80  Ht 5\' 8"  (1.727 m)  Wt 213 lb (96.616 kg)  BMI 32.39 kg/m2  Gen: NAD, alert, cooperative with exam,  CV: RRR, good S1/S2, no murmur Resp: CTABL, no wheezes, non-labored Ext: No edema, stage 2 ulcer 69mm x 79mm circular on his plantar aspect of right forefoot as the base of the 1st metatarsal head.  No ulcers noted on his left foot.  He has intact sensation of his feet bilaterally. Pulses  are +2 bilaterally in LE and feet.     Assessment:     1. HTN 2. T2DM  3. Smoking counseling.   4. HLD.     Plan:     See problem based charting.

## 2012-11-25 NOTE — Assessment & Plan Note (Addendum)
Complains a worsening blurry vision and also at his visit in July. He was referred to an ophthalmologist in July but he never went.  - a diabetic eye exam was performed here but was unable to quantify his sight because it was so poor.  - can consider referral again but his compliance is always an issue.

## 2012-11-25 NOTE — Assessment & Plan Note (Signed)
Patient expressed interest in quitting. Question to his motivation. He didn't express a quit date. His only reason was that it is bad for his health.  - he should try to the gum or patch first  - if unsuccessful then proceed to possible medications.

## 2012-11-30 ENCOUNTER — Other Ambulatory Visit: Payer: Self-pay | Admitting: Family Medicine

## 2012-11-30 DIAGNOSIS — E119 Type 2 diabetes mellitus without complications: Secondary | ICD-10-CM

## 2012-11-30 MED ORDER — INSULIN PEN NEEDLE 31G X 8 MM MISC: Status: DC

## 2012-12-27 ENCOUNTER — Telehealth: Payer: Self-pay | Admitting: Family Medicine

## 2012-12-27 NOTE — Telephone Encounter (Signed)
Pt called and would like refill on his lisinopril called in . jw

## 2012-12-28 ENCOUNTER — Other Ambulatory Visit: Payer: Self-pay | Admitting: Family Medicine

## 2012-12-28 DIAGNOSIS — I119 Hypertensive heart disease without heart failure: Secondary | ICD-10-CM

## 2012-12-28 MED ORDER — LISINOPRIL 40 MG PO TABS: 40.0000 mg | ORAL_TABLET | Freq: Every day | ORAL | Status: DC

## 2012-12-28 NOTE — Telephone Encounter (Signed)
Spoke with patient and informed him of below 

## 2013-01-04 ENCOUNTER — Inpatient Hospital Stay (HOSPITAL_COMMUNITY)
Admission: EM | Admit: 2013-01-04 | Discharge: 2013-01-07 | DRG: 638 | Disposition: A | Discharge status: Home / Self Care | Payer: Medicaid Other | Attending: Family Medicine | Admitting: Family Medicine

## 2013-01-04 ENCOUNTER — Encounter (HOSPITAL_COMMUNITY): Payer: Self-pay | Admitting: Emergency Medicine

## 2013-01-04 ENCOUNTER — Emergency Department (HOSPITAL_COMMUNITY): Payer: Medicaid Other

## 2013-01-04 DIAGNOSIS — Z794 Long term (current) use of insulin: Secondary | ICD-10-CM

## 2013-01-04 DIAGNOSIS — Z7982 Long term (current) use of aspirin: Secondary | ICD-10-CM

## 2013-01-04 DIAGNOSIS — E119 Type 2 diabetes mellitus without complications: Secondary | ICD-10-CM

## 2013-01-04 DIAGNOSIS — F17200 Nicotine dependence, unspecified, uncomplicated: Secondary | ICD-10-CM | POA: Diagnosis present

## 2013-01-04 DIAGNOSIS — L97509 Non-pressure chronic ulcer of other part of unspecified foot with unspecified severity: Secondary | ICD-10-CM | POA: Diagnosis present

## 2013-01-04 DIAGNOSIS — I10 Essential (primary) hypertension: Secondary | ICD-10-CM | POA: Diagnosis present

## 2013-01-04 DIAGNOSIS — I119 Hypertensive heart disease without heart failure: Secondary | ICD-10-CM

## 2013-01-04 DIAGNOSIS — I96 Gangrene, not elsewhere classified: Secondary | ICD-10-CM | POA: Diagnosis present

## 2013-01-04 DIAGNOSIS — E11628 Type 2 diabetes mellitus with other skin complications: Secondary | ICD-10-CM

## 2013-01-04 DIAGNOSIS — IMO0002 Reserved for concepts with insufficient information to code with codable children: Principal | ICD-10-CM | POA: Diagnosis present

## 2013-01-04 DIAGNOSIS — D649 Anemia, unspecified: Secondary | ICD-10-CM | POA: Diagnosis present

## 2013-01-04 DIAGNOSIS — E1165 Type 2 diabetes mellitus with hyperglycemia: Principal | ICD-10-CM | POA: Diagnosis present

## 2013-01-04 DIAGNOSIS — E875 Hyperkalemia: Secondary | ICD-10-CM | POA: Diagnosis present

## 2013-01-04 DIAGNOSIS — E782 Mixed hyperlipidemia: Secondary | ICD-10-CM | POA: Diagnosis present

## 2013-01-04 LAB — POCT I-STAT, CHEM 8
Calcium, Ion: 1.26 mmol/L (ref 1.13–1.30)
Chloride: 106 mEq/L (ref 96–112)
Creatinine, Ser: 1.4 mg/dL — ABNORMAL HIGH (ref 0.50–1.35)
HCT: 35 % — ABNORMAL LOW (ref 39.0–52.0)
Sodium: 136 mEq/L (ref 135–145)
TCO2: 21 mmol/L (ref 0–100)

## 2013-01-04 LAB — GLUCOSE, CAPILLARY
Glucose-Capillary: 258 mg/dL — ABNORMAL HIGH (ref 70–99)
Glucose-Capillary: 164 mg/dL — ABNORMAL HIGH (ref 70–99)

## 2013-01-04 LAB — CBC WITH DIFFERENTIAL/PLATELET
Basophils Absolute: 0 10*3/uL (ref 0.0–0.1)
Eosinophils Absolute: 0.1 10*3/uL (ref 0.0–0.7)
Eosinophils Relative: 2 % (ref 0–5)
HCT: 32.2 % — ABNORMAL LOW (ref 39.0–52.0)
Hemoglobin: 10.6 g/dL — ABNORMAL LOW (ref 13.0–17.0)
Lymphs Abs: 1.5 10*3/uL (ref 0.7–4.0)
MCH: 27.4 pg (ref 26.0–34.0)
MCV: 83.2 fL (ref 78.0–100.0)
Monocytes Absolute: 0.6 10*3/uL (ref 0.1–1.0)
Monocytes Relative: 8 % (ref 3–12)
Neutrophils Relative %: 68 % (ref 43–77)
RBC: 3.87 MIL/uL — ABNORMAL LOW (ref 4.22–5.81)

## 2013-01-04 LAB — BASIC METABOLIC PANEL
Calcium: 10.4 mg/dL (ref 8.4–10.5)
Chloride: 98 mEq/L (ref 96–112)
Creatinine, Ser: 1.33 mg/dL (ref 0.50–1.35)
GFR calc Af Amer: 65 mL/min — ABNORMAL LOW (ref >90)
Sodium: 131 mEq/L — ABNORMAL LOW (ref 135–145)

## 2013-01-04 MED ORDER — SODIUM CHLORIDE 0.9 % IJ SOLN: 3.0000 mL | Freq: Two times a day (BID) | INTRAMUSCULAR | Status: DC

## 2013-01-04 MED ORDER — SODIUM CHLORIDE 0.9 % IV SOLN
INTRAVENOUS | Status: AC
  Administered 2013-01-04: 18:00:00 via INTRAVENOUS

## 2013-01-04 MED ORDER — HYDROCODONE-ACETAMINOPHEN 5-325 MG PO TABS
1.0000 | ORAL_TABLET | ORAL | Status: DC | PRN
  Administered 2013-01-05: 1 via ORAL
  Filled 2013-01-04: qty 1

## 2013-01-04 MED ORDER — SIMVASTATIN 20 MG PO TABS
20.0000 mg | ORAL_TABLET | Freq: Every day | ORAL | Status: DC
  Administered 2013-01-05 – 2013-01-06 (×2): 20 mg via ORAL
  Filled 2013-01-04 (×3): qty 1

## 2013-01-04 MED ORDER — ENOXAPARIN SODIUM 40 MG/0.4ML ~~LOC~~ SOLN
40.0000 mg | SUBCUTANEOUS | Status: DC
  Administered 2013-01-04 – 2013-01-05 (×2): 40 mg via SUBCUTANEOUS
  Filled 2013-01-04 (×3): qty 0.4

## 2013-01-04 MED ORDER — AMLODIPINE BESYLATE 10 MG PO TABS
10.0000 mg | ORAL_TABLET | Freq: Every day | ORAL | Status: DC
  Administered 2013-01-05 – 2013-01-07 (×2): 10 mg via ORAL
  Filled 2013-01-04 (×3): qty 1

## 2013-01-04 MED ORDER — ATENOLOL 100 MG PO TABS
100.0000 mg | ORAL_TABLET | Freq: Every day | ORAL | Status: DC
  Administered 2013-01-05 – 2013-01-07 (×2): 100 mg via ORAL
  Filled 2013-01-04 (×3): qty 1

## 2013-01-04 MED ORDER — INSULIN ASPART 100 UNIT/ML IV SOLN
10.0000 [IU] | Freq: Once | INTRAVENOUS | Status: AC
  Administered 2013-01-04: 10 [IU] via INTRAVENOUS
  Filled 2013-01-04: qty 0.1

## 2013-01-04 MED ORDER — VANCOMYCIN HCL 10 G IV SOLR
1500.0000 mg | Freq: Once | INTRAVENOUS | Status: AC
  Administered 2013-01-04: 1500 mg via INTRAVENOUS
  Filled 2013-01-04: qty 1500

## 2013-01-04 MED ORDER — PIPERACILLIN-TAZOBACTAM 3.375 G IVPB
3.3750 g | Freq: Three times a day (TID) | INTRAVENOUS | Status: DC
  Administered 2013-01-04 – 2013-01-07 (×8): 3.375 g via INTRAVENOUS
  Filled 2013-01-04 (×11): qty 50

## 2013-01-04 MED ORDER — PNEUMOCOCCAL VAC POLYVALENT 25 MCG/0.5ML IJ INJ
0.5000 mL | INJECTION | INTRAMUSCULAR | Status: AC
  Administered 2013-01-05: 10:00:00 0.5 mL via INTRAMUSCULAR
  Filled 2013-01-04 (×2): qty 0.5

## 2013-01-04 MED ORDER — ASPIRIN EC 81 MG PO TBEC
81.0000 mg | DELAYED_RELEASE_TABLET | Freq: Every day | ORAL | Status: DC
  Administered 2013-01-05 – 2013-01-07 (×2): 81 mg via ORAL
  Filled 2013-01-04 (×3): qty 1

## 2013-01-04 MED ORDER — SODIUM POLYSTYRENE SULFONATE 15 GM/60ML PO SUSP
30.0000 g | Freq: Once | ORAL | Status: AC
  Administered 2013-01-04: 30 g via ORAL
  Filled 2013-01-04: qty 120

## 2013-01-04 MED ORDER — VANCOMYCIN HCL IN DEXTROSE 1-5 GM/200ML-% IV SOLN
1000.0000 mg | Freq: Two times a day (BID) | INTRAVENOUS | Status: DC
  Administered 2013-01-05 – 2013-01-07 (×5): 1000 mg via INTRAVENOUS
  Filled 2013-01-04 (×6): qty 200

## 2013-01-04 MED ORDER — INSULIN ASPART 100 UNIT/ML ~~LOC~~ SOLN
0.0000 [IU] | Freq: Three times a day (TID) | SUBCUTANEOUS | Status: DC
  Administered 2013-01-05: 5 [IU] via SUBCUTANEOUS
  Administered 2013-01-05: 8 [IU] via SUBCUTANEOUS
  Administered 2013-01-05: 08:00:00 5 [IU] via SUBCUTANEOUS
  Administered 2013-01-06 (×2): 8 [IU] via SUBCUTANEOUS
  Administered 2013-01-07: 11 [IU] via SUBCUTANEOUS
  Administered 2013-01-07: 8 [IU] via SUBCUTANEOUS

## 2013-01-04 MED ORDER — INSULIN ASPART 100 UNIT/ML ~~LOC~~ SOLN
4.0000 [IU] | Freq: Three times a day (TID) | SUBCUTANEOUS | Status: DC
  Administered 2013-01-05 (×2): 4 [IU] via SUBCUTANEOUS

## 2013-01-04 MED ORDER — CLONIDINE HCL 0.2 MG PO TABS
0.2000 mg | ORAL_TABLET | Freq: Two times a day (BID) | ORAL | Status: DC
  Administered 2013-01-04 – 2013-01-07 (×5): 0.2 mg via ORAL
  Filled 2013-01-04 (×7): qty 1

## 2013-01-04 MED ORDER — INSULIN GLARGINE 100 UNIT/ML ~~LOC~~ SOLN
35.0000 [IU] | Freq: Every day | SUBCUTANEOUS | Status: DC
  Administered 2013-01-05: 35 [IU] via SUBCUTANEOUS
  Filled 2013-01-04 (×2): qty 0.35

## 2013-01-04 MED ORDER — SODIUM CHLORIDE 0.9 % IV SOLN
1.0000 g | Freq: Once | INTRAVENOUS | Status: AC
  Administered 2013-01-04: 1 g via INTRAVENOUS
  Filled 2013-01-04: qty 10

## 2013-01-04 NOTE — H&P (Signed)
[facility] Admission History and Physical Service Pager: [phone]  [name] Medical record number: [id number] Date of birth: [birth year] Age: 62 y.o. Gender: male  Primary Care Provider: Clearance Coots, MD Consultants: None Code Status: Full  Chief Complaint: Right foot wound  Assessment and Plan: Leonard [name] is a 62 y.o. male presenting with a right foot wound with gangrene of the 5th digit and also found to have hyperkalemia. PMH is significant for uncontrolled DM and foot ulcer.   Foot Wound - Appears to be very chronic with gangrene of the 5th digit of right foot that appears to be a vascular lesions with components of diabetic ulceration without surrounding cellulitis - Start Vanc for MRSA coverage and Zosyn for pseudomonal coverage - F/u [name] cx; No wound drainage to culture - Obtain ABI to assess for PAD -risk factors = age, DM, HTN, smoking - Obtain wound consult - Consult vascular vs orthopedic surgery in AM based upon ABI  # Hyperkalemia - K+ 5.9 on admission to [place], no EKG changes from baseline; pt given calcium, insulin and kayexalate; etiology likely due to medications since lisinopril, triamterene, and potassium all on patient's list - Hold possible offending agents - F/u BMP at midnight  # Diabetes - A1C 9.6 on 11/25/12 and pt with history of non adherence - Hold home glipizide and metformin - Decrease Lantus from 40 units home dose to 35 units since following carb modified diet - Medium sliding scale with 4 units with meals  # HTN - history of high BP (190s) when non adherent - Cont amlodipine, atenolol, and clonidine - Hold  lisinopril, triamterene/HCTZ given hyperkalemia    # HLD - Cont statin  # Tobacco Abuse - Consider   FEN/GI: Carb modified diet Prophylaxis: Lovenox SQ daily  Disposition: Admit to Family Medicine Inpatient Teaching Service  History of Present Illness: Leonard [name] is a  62 y.o. male presenting with right foot wounds. These started several months ago, and the patient actually saw a podiatrist Harriet Masson on 11/16/12, who debrided tissue, applied silvadene dressing and gave prescription for Keflex. The patient was supposed to follow up in 2-3 weeks but never did. He came into the ED today because his neighbor told him to. In the ED at [facility] there was concern for infection with gangrene, and the patient was also found to have hyperkalemia (6.0). Therefore, he was admitted for further care. Currently, he denies pain in that area. He denies fever or chills. He denies pain in his calves with walking. He also denies shortness of breath and chest pain. Ho notes that he has been taking all of his medications at home.   Review Of Systems: Per HPI with the following additions: no Otherwise 12 point review of systems was performed and was unremarkable.  Patient Active Problem List   Diagnosis Date Noted  . Diabetic foot infection 01/04/2013  . Essential hypertension, benign 11/25/2012  . Blurry vision, bilateral 11/25/2012  . Diabetes mellitus, type 2 01/06/2012  . MIXED HYPERLIPIDEMIA 07/17/2008  . TOBACCO ABUSE 07/17/2008  . HYPERTENSION, HEART UNCONTROLLED W/O ASSOC CHF 07/17/2008  . HYPERTENSION, HEART CONTROLLED W/O ASSOC CHF 07/17/2008   Past Medical History: Past Medical History  Diagnosis Date  . Diabetes mellitus without complication   . Hypertension    Past Surgical History: Past Surgical History  Procedure Laterality Date  . Throat surgery  1983    polyps   Social History: History  Substance  Use Topics  . Smoking status: Current Every Day Smoker -- 1.00 packs/day for 44 years    Types: Cigarettes  . Smokeless tobacco: Never Used  . Alcohol Use: No   Additional social history: none  Please also refer to relevant sections of EMR.  Family History: Family History  Problem Relation Age of Onset  . Cancer Mother     lung cancer  .  Hypertension Mother   . Cancer Sister     patient thinks it was uterine cancer  . Hypertension Sister    Allergies and Medications: No Known Allergies No current facility-administered medications on file prior to encounter.   Current Outpatient Prescriptions on File Prior to Encounter  Medication Sig Dispense Refill  . amLODipine (NORVASC) 10 MG tablet Take 1 tablet (10 mg total) by mouth daily.  90 tablet  1  . cloNIDine (CATAPRES) 0.2 MG tablet Take 1 tablet (0.2 mg total) by mouth 2 (two) times daily.  90 tablet  1  . glipiZIDE (GLUCOTROL) 10 MG tablet Take 1 tablet (10 mg total) by mouth 2 (two) times daily before a meal.  90 tablet  1  . insulin glargine (LANTUS) 100 UNIT/ML injection Inject 0.5 mLs (50 Units total) into the skin daily.  5 pen  11  . lisinopril (PRINIVIL,ZESTRIL) 40 MG tablet Take 1 tablet (40 mg total) by mouth daily.  30 tablet  3  . metFORMIN (GLUCOPHAGE) 1000 MG tablet Take 1 tablet (1,000 mg total) by mouth 2 (two) times daily with a meal.  60 tablet  6  . pravastatin (PRAVACHOL) 40 MG tablet Take 1 tablet (40 mg total) by mouth daily.  90 tablet  1  . triamterene-hydrochlorothiazide (MAXZIDE) 75-50 MG per tablet TAKE ONE TABLET BY MOUTH ONCE DAILY  90 tablet  1    Objective: BP 158/74  Pulse 76  Temp(Src) 98.3 F (36.8 C) (Oral)  Resp 20  Ht 5\' 8"  (1.727 m)  Wt 205 lb 1.6 oz (93.033 kg)  BMI 31.19 kg/m2  SpO2 96% Exam: General: middle age BM, non distressed HEENT: NCAT, PERRLA, EOMI, neck with normal ROM Cardiovascular: RRR, no murmurs; bilateral DP pulses heard on doppler; left PT pulse but not right appreciated Respiratory: normal work of breathing, CTA-B Abdomen: soft, NDNT Extremities:  Right foot with eschar of dorsal 5th digit and medial ulcertaion without drainage; Partial thickness would of lateral right 5th MTP joint without drainage or surround cellulitis; thickened callous with some drainage for right first MTP joint on plantar aspect; Left  foot with callous numerous pre-ulcerative calluses  Skin: aside from feet, no other rashes or ulceration Neuro: alert and oriented x 3, no focal deficits   Labs and Imaging: CBC BMET   Recent Labs Lab 01/04/13 1526 01/04/13 1708  WBC 6.8  --   HGB 10.6* 11.9*  HCT 32.2* 35.0*  PLT 189  --     Recent Labs Lab 01/04/13 1526 01/04/13 1708  NA 131* 136  K 6.0* 5.9*  CL 98 106  CO2 22  --   BUN 31* 32*  CREATININE 1.33 1.40*  GLUCOSE 350* 293*  CALCIUM 10.4  --      Dg Foot 2 Views Right  01/04/2013   CLINICAL DATA:  Diabetic foot ulcer.  EXAM: RIGHT FOOT - 2 VIEW  COMPARISON:  None.  FINDINGS: There is no evidence of fracture or dislocation. There is no evidence of arthropathy or other focal bone abnormality. Vascular calcifications are noted.  IMPRESSION: No acute  abnormality seen in the right foot.   Electronically Signed   By: Sabino Dick M.D.   On: 01/04/2013 15:41     Angelica Ran, MD 01/04/2013, 9:35 PM PGY-3, [facility] pager: [phone], text pages welcome

## 2013-01-04 NOTE — ED Notes (Signed)
Report given to Otila Kluver, RN at [facility].

## 2013-01-04 NOTE — Progress Notes (Addendum)
ANTIBIOTIC CONSULT NOTE - INITIAL  Pharmacy Consult for vancomycin/zosyn Indication: diabetic foot infection  No Known Allergies  Patient Measurements: Height: 5\' 8"  (172.7 cm) Weight: 205 lb 1.6 oz (93.033 kg) IBW/kg (Calculated) : 68.4   Vital Signs: Temp: 98.3 F (36.8 C) (12/02 2057) Temp src: Oral (12/02 2057) BP: 158/74 mmHg (12/02 2057) Pulse Rate: 76 (12/02 2057) Intake/Output from previous day:   Intake/Output from this shift:    Labs:  Recent Labs  01/04/13 1526 01/04/13 1708  WBC 6.8  --   HGB 10.6* 11.9*  PLT 189  --   CREATININE 1.33 1.40*   Estimated Creatinine Clearance: 61.3 ml/min (by C-G formula based on Cr of 1.4). No results found for this basename: VANCOTROUGH, VANCOPEAK, VANCORANDOM, GENTTROUGH, GENTPEAK, GENTRANDOM, TOBRATROUGH, TOBRAPEAK, TOBRARND, AMIKACINPEAK, AMIKACINTROU, AMIKACIN,  in the last 72 hours   Microbiology: No results found for this or any previous visit (from the past 720 hour(s)).  Medical History: Past Medical History  Diagnosis Date  . Diabetes mellitus without complication   . Hypertension     Medications:  Prescriptions prior to admission  Medication Sig Dispense Refill  . amLODipine (NORVASC) 10 MG tablet Take 1 tablet (10 mg total) by mouth daily.  90 tablet  1  . atenolol (TENORMIN) 100 MG tablet Take 100 mg by mouth daily.      . cloNIDine (CATAPRES) 0.2 MG tablet Take 1 tablet (0.2 mg total) by mouth 2 (two) times daily.  90 tablet  1  . glipiZIDE (GLUCOTROL) 10 MG tablet Take 1 tablet (10 mg total) by mouth 2 (two) times daily before a meal.  90 tablet  1  . insulin glargine (LANTUS) 100 UNIT/ML injection Inject 0.5 mLs (50 Units total) into the skin daily.  5 pen  11  . lisinopril (PRINIVIL,ZESTRIL) 40 MG tablet Take 1 tablet (40 mg total) by mouth daily.  30 tablet  3  . metFORMIN (GLUCOPHAGE) 1000 MG tablet Take 1 tablet (1,000 mg total) by mouth 2 (two) times daily with a meal.  60 tablet  6  . potassium  chloride SA (K-DUR,KLOR-CON) 20 MEQ tablet Take 20 mEq by mouth daily.      . pravastatin (PRAVACHOL) 40 MG tablet Take 1 tablet (40 mg total) by mouth daily.  90 tablet  1  . triamterene-hydrochlorothiazide (MAXZIDE) 75-50 MG per tablet TAKE ONE TABLET BY MOUTH ONCE DAILY  90 tablet  1   Scheduled:  . sodium chloride   Intravenous STAT  . [START ON 01/05/2013] amLODipine  10 mg Oral Daily  . [START ON 01/05/2013] aspirin EC  81 mg Oral Daily  . [START ON 01/05/2013] atenolol  100 mg Oral Daily  . cloNIDine  0.2 mg Oral BID  . enoxaparin (LOVENOX) injection  40 mg Subcutaneous Q24H  . [START ON 01/05/2013] insulin aspart  0-15 Units Subcutaneous TID WC  . [START ON 01/05/2013] insulin aspart  4 Units Subcutaneous TID WC  . insulin glargine  35 Units Subcutaneous QHS  . [START ON 01/05/2013] pneumococcal 23 valent vaccine  0.5 mL Intramuscular Tomorrow-1000  . [START ON 01/05/2013] simvastatin  20 mg Oral q1800  . sodium chloride  3 mL Intravenous Q12H     Assessment: 62 yo male presenting with ulcers on the right foot. Pattient to begin vancomycin/zosyn for diabetic foot infection. SCr= 1.4, CrCl ~ 60  Goal of Therapy:  Vancomycin trough level 15-20 mcg/ml  Plan:  -Zosyn 3.375gm IV 8hr and vancomycin 1500mg  IV x1 followed by  1000mg  IV q12h -Will follow renal function, cultures and clinical progress   Hildred Laser, Pharm D 01/04/2013 10:48 PM

## 2013-01-04 NOTE — ED Provider Notes (Signed)
CSN: [id number]     Arrival date & time 01/04/13  1358 History   First MD Initiated Contact with Patient 01/04/13 1501     Chief Complaint  Patient presents with  . Diabetic Ulcer   (Consider location/radiation/quality/duration/timing/severity/associated sxs/prior Treatment) HPI Comments: Patient presents to the ER for evaluation of ulcers on his right foot. Patient says that he just noticed these one week ago. He reports moderately painful ulcers on the bottom of his foot as well as on the pinky toe area. He has not been experiencing any fevers. He is a diabetic, has not been checking her sugars.   Past Medical History  Diagnosis Date  . Diabetes mellitus without complication   . Hypertension    History reviewed. No pertinent past surgical history. Family History  Problem Relation Age of Onset  . Cancer Mother     lung cancer  . Hypertension Mother   . Cancer Sister     patient thinks it was uterine cancer  . Hypertension Sister    History  Substance Use Topics  . Smoking status: Current Every Day Smoker -- 1.00 packs/day for 44 years    Types: Cigarettes  . Smokeless tobacco: Not on file  . Alcohol Use: No    Review of Systems  Constitutional: Negative for fever.  Skin: Positive for wound.  All other systems reviewed and are negative.    Allergies  Review of patient's allergies indicates no known allergies.  Home Medications   Current Outpatient Rx  Name  Route  Sig  Dispense  Refill  . amLODipine (NORVASC) 10 MG tablet   Oral   Take 1 tablet (10 mg total) by mouth daily.   90 tablet   1   . atenolol (TENORMIN) 100 MG tablet   Oral   Take 100 mg by mouth daily.         . cloNIDine (CATAPRES) 0.2 MG tablet   Oral   Take 1 tablet (0.2 mg total) by mouth 2 (two) times daily.   90 tablet   1   . glipiZIDE (GLUCOTROL) 10 MG tablet   Oral   Take 1 tablet (10 mg total) by mouth 2 (two) times daily before a meal.   90 tablet   1   . insulin glargine  (LANTUS) 100 UNIT/ML injection   Subcutaneous   Inject 0.5 mLs (50 Units total) into the skin daily.   5 pen   11   . lisinopril (PRINIVIL,ZESTRIL) 40 MG tablet   Oral   Take 1 tablet (40 mg total) by mouth daily.   30 tablet   3   . metFORMIN (GLUCOPHAGE) 1000 MG tablet   Oral   Take 1 tablet (1,000 mg total) by mouth 2 (two) times daily with a meal.   60 tablet   6   . potassium chloride SA (K-DUR,KLOR-CON) 20 MEQ tablet   Oral   Take 20 mEq by mouth daily.         . pravastatin (PRAVACHOL) 40 MG tablet   Oral   Take 1 tablet (40 mg total) by mouth daily.   90 tablet   1   . triamterene-hydrochlorothiazide (MAXZIDE) 75-50 MG per tablet      TAKE ONE TABLET BY MOUTH ONCE DAILY   90 tablet   1    BP 179/79  Pulse 72  Temp(Src) 98.1 F (36.7 C) (Oral)  Resp 20  SpO2 99% Physical Exam  Constitutional: He is oriented to person, place, and  time. He appears well-developed and well-nourished. No distress.  HENT:  Head: Normocephalic and atraumatic.  Right Ear: Hearing normal.  Left Ear: Hearing normal.  Nose: Nose normal.  Mouth/Throat: Oropharynx is clear and moist and mucous membranes are normal.  Eyes: Conjunctivae and EOM are normal. Pupils are equal, round, and reactive to light.  Neck: Normal range of motion. Neck supple.  Cardiovascular: Regular rhythm, S1 normal and S2 normal.  Exam reveals no gallop and no friction rub.   No murmur heard. Pulmonary/Chest: Effort normal and breath sounds normal. No respiratory distress. He exhibits no tenderness.  Abdominal: Soft. Normal appearance and bowel sounds are normal. There is no hepatosplenomegaly. There is no tenderness. There is no rebound, no guarding, no tenderness at McBurney's point and negative Murphy's sign. No hernia.  Musculoskeletal: Normal range of motion.  Neurological: He is alert and oriented to person, place, and time. He has normal strength. No cranial nerve deficit or sensory deficit.  Coordination normal. GCS eye subscore is 4. GCS verbal subscore is 5. GCS motor subscore is 6.  Skin: Skin is warm, dry and intact. No rash noted. No cyanosis.  Circular callous on plantar aspect of right MTP joint  Blackened eschar of right 5th toe  Deep ulceration over lateral aspect of 5th MTP joint  Psychiatric: He has a normal mood and affect. His speech is normal and behavior is normal. Thought content normal.    ED Course  Procedures (including critical care time) Labs Review Labs Reviewed  CBC WITH DIFFERENTIAL - Abnormal; Notable for the following:    RBC 3.87 (*)    Hemoglobin 10.6 (*)    HCT 32.2 (*)    All other components within normal limits  BASIC METABOLIC PANEL - Abnormal; Notable for the following:    Sodium 131 (*)    Potassium 6.0 (*)    Glucose, Bld 350 (*)    BUN 31 (*)    GFR calc non Af Amer 56 (*)    GFR calc Af Amer 65 (*)    All other components within normal limits  POCT I-STAT, CHEM 8 - Abnormal; Notable for the following:    Potassium 5.9 (*)    BUN 32 (*)    Creatinine, Ser 1.40 (*)    Glucose, Bld 293 (*)    Hemoglobin 11.9 (*)    HCT 35.0 (*)    All other components within normal limits  CULTURE, BLOOD (ROUTINE X 2)  CULTURE, BLOOD (ROUTINE X 2)  HEMOGLOBIN A1C   Imaging Review Dg Foot 2 Views Right  01/04/2013   CLINICAL DATA:  Diabetic foot ulcer.  EXAM: RIGHT FOOT - 2 VIEW  COMPARISON:  None.  FINDINGS: There is no evidence of fracture or dislocation. There is no evidence of arthropathy or other focal bone abnormality. Vascular calcifications are noted.  IMPRESSION: No acute abnormality seen in the right foot.   Electronically Signed   By: Sabino Dick M.D.   On: 01/04/2013 15:41    EKG Interpretation   None       MDM   1. Hyperkalemia   2. Uncontrolled diabetes mellitus   3. Diabetic foot infection    Patient presents to the ER for evaluation of a wound on his foot. He reports that it started several days ago, but  examination reveals significant ulceration with eschar and likely early getting green of the fifth toe. This is likely not going longer than this. Patient is diabetic, has not been checking his  blood sugars. Sugar was 350 on arrival. X-ray does not show any obvious osteomyelitis. Routine blood work does show hyperkalemia. Potassium was 6.0. This was rechecked it was 5.9. Etiology of the hyperkalemia is unclear, he is now on supplementation and he is not in renal failure. Patient administered calcium, insulin and Kayexalate.  Patient will require hospitalization for further management of his hyperkalemia as well as treatment of diabetic foot.        Orpah Greek, MD 01/04/13 2026

## 2013-01-04 NOTE — ED Notes (Signed)
Pt present with 2 ulcers on right foot, Pinky toe is necrotic with pink, red and black tissue, painful to walk on it. Pt has small ones on sole of foot. Pt has DM. First noticed last Tuesday and went and brought new sneakers and has gotten worse since then. Pt has diabetic foot doctor.

## 2013-01-04 NOTE — ED Notes (Signed)
CBG of 258 was originally resulted at 1821.

## 2013-01-04 NOTE — ED Notes (Signed)
[facility] called for transport. 

## 2013-01-05 DIAGNOSIS — E875 Hyperkalemia: Secondary | ICD-10-CM

## 2013-01-05 DIAGNOSIS — F17200 Nicotine dependence, unspecified, uncomplicated: Secondary | ICD-10-CM

## 2013-01-05 DIAGNOSIS — E782 Mixed hyperlipidemia: Secondary | ICD-10-CM

## 2013-01-05 DIAGNOSIS — I96 Gangrene, not elsewhere classified: Secondary | ICD-10-CM

## 2013-01-05 DIAGNOSIS — I70269 Atherosclerosis of native arteries of extremities with gangrene, unspecified extremity: Secondary | ICD-10-CM

## 2013-01-05 DIAGNOSIS — L97509 Non-pressure chronic ulcer of other part of unspecified foot with unspecified severity: Secondary | ICD-10-CM

## 2013-01-05 DIAGNOSIS — E1169 Type 2 diabetes mellitus with other specified complication: Secondary | ICD-10-CM

## 2013-01-05 DIAGNOSIS — E119 Type 2 diabetes mellitus without complications: Secondary | ICD-10-CM

## 2013-01-05 DIAGNOSIS — I10 Essential (primary) hypertension: Secondary | ICD-10-CM

## 2013-01-05 DIAGNOSIS — L089 Local infection of the skin and subcutaneous tissue, unspecified: Secondary | ICD-10-CM

## 2013-01-05 LAB — HEMOGLOBIN A1C
Hgb A1c MFr Bld: 12 % — ABNORMAL HIGH (ref <5.7)
Hgb A1c MFr Bld: 11.7 % — ABNORMAL HIGH (ref <5.7)
Mean Plasma Glucose: 298 mg/dL — ABNORMAL HIGH (ref <117)

## 2013-01-05 LAB — BASIC METABOLIC PANEL
BUN: 25 mg/dL — ABNORMAL HIGH (ref 6–23)
Chloride: 106 mEq/L (ref 96–112)
Creatinine, Ser: 1.12 mg/dL (ref 0.50–1.35)
GFR calc non Af Amer: 69 mL/min — ABNORMAL LOW (ref >90)
Glucose, Bld: 270 mg/dL — ABNORMAL HIGH (ref 70–99)
Potassium: 4.7 mEq/L (ref 3.5–5.1)

## 2013-01-05 LAB — CBC
HCT: 30.3 % — ABNORMAL LOW (ref 39.0–52.0)
Hemoglobin: 10.2 g/dL — ABNORMAL LOW (ref 13.0–17.0)
MCH: 27.9 pg (ref 26.0–34.0)
MCHC: 33.7 g/dL (ref 30.0–36.0)
MCV: 82.8 fL (ref 78.0–100.0)
RDW: 13.4 % (ref 11.5–15.5)

## 2013-01-05 LAB — GLUCOSE, CAPILLARY
Glucose-Capillary: 212 mg/dL — ABNORMAL HIGH (ref 70–99)
Glucose-Capillary: 270 mg/dL — ABNORMAL HIGH (ref 70–99)

## 2013-01-05 MED ORDER — INSULIN ASPART 100 UNIT/ML ~~LOC~~ SOLN
6.0000 [IU] | Freq: Three times a day (TID) | SUBCUTANEOUS | Status: DC
  Administered 2013-01-05 – 2013-01-06 (×2): 6 [IU] via SUBCUTANEOUS

## 2013-01-05 MED ORDER — MUPIROCIN CALCIUM 2 % EX CREA
TOPICAL_CREAM | Freq: Every day | CUTANEOUS | Status: DC
  Administered 2013-01-05: 12:00:00 via TOPICAL
  Filled 2013-01-05 (×3): qty 15

## 2013-01-05 MED ORDER — INSULIN GLARGINE 100 UNIT/ML ~~LOC~~ SOLN
40.0000 [IU] | Freq: Every day | SUBCUTANEOUS | Status: DC
  Administered 2013-01-05: 40 [IU] via SUBCUTANEOUS
  Filled 2013-01-05 (×2): qty 0.4

## 2013-01-05 NOTE — Progress Notes (Signed)
I saw Mr. Critchfield at bedside. I appreciate the care provided to him by the [facility] Medicine teaching service team as well as from Vascular surgery and wound care.   Clearance Coots, MD PGY-1, [facility] 01/05/2013, 5:30 PM

## 2013-01-05 NOTE — Progress Notes (Signed)
FMTS Attending Daily Note:  Annabell Sabal MD  [phone] pager  Family Practice pager:  [phone] I have discussed this patient with the resident Dr. Skeet Simmer and attending physician Dr. Ree Kida.  I agree with their findings, assessment, and care plan

## 2013-01-05 NOTE — Progress Notes (Signed)
Family Medicine Teaching Service Daily Progress Note Intern Pager: [phone]  [name] Medical record number: [id number] Date of birth: 23-Dec-1950 Age: 62 y.o. Gender: male  Primary Care Provider: Clearance Coots, MD Consultants: Vasc Code Status: Full  Pt Overview and Major Events to Date:   Assessment and Plan: Leonard [name] is a 62 y.o. male presenting with a right foot wound with gangrene of the 5th digit and also found to have hyperkalemia. PMH is significant for uncontrolled DM and foot ulcer.   #Foot Wound - Appears to be very chronic with gangrene of the 5th digit of right foot that appears to be a vascular lesions with components of diabetic ulceration without surrounding cellulitis. Unable to palpate DP bilat manually - cont Vanc for MRSA coverage and Zosyn for pseudomonal coverage  - F/u blood cx; No wound drainage to culture  - Obtain ABI to assess for PAD -risk factors = age, DM, HTN, smoking  - wound consult pending - vasc aware, no additional recs for pre-op management, will see today  # Hyperkalemia - K+ 5.9 on admission to [place], no EKG changes from baseline; pt given calcium, insulin and kayexalate; etiology likely due to medications since lisinopril, triamterene, and potassium all on patient's list. Improved to 4.7 this am - Hold possible offending agents  - cont to trend daily   # Diabetes - A1C 9.6 on 11/25/12 and pt with history of non adherence. Elevated CBG this morning 224 - Hold home glipizide and metformin  - will go back to home dose lantus - Medium sliding scale with 4 units with meals   # HTN - history of high BP (190s) when non adherent , 130s here this am - Cont amlodipine, atenolol, and clonidine  - Hold lisinopril, triamterene/HCTZ given hyperkalemia   # HLD  - Cont statin   # Tobacco Abuse  - Consider patch -encourage cessation, esp in light of above problems   FEN/GI: carb modified PPx: lovenox sq  Disposition:  pending vasc recs, likely removal of gangrenous toe  Subjective: no complaints this am, feels that he is doing well, states that toes is a chronic issue, no home falls or nerve deficits   Objective: Temp:  [98.1 F (36.7 C)-99 F (37.2 C)] 98.5 F (36.9 C) (12/03 0603) Pulse Rate:  [71-76] 71 (12/03 0603) Resp:  [18-20] 20 (12/03 0603) BP: (139-179)/(67-79) 139/67 mmHg (12/03 0603) SpO2:  [96 %-99 %] 98 % (12/03 0603) Weight:  [205 lb 1.6 oz (93.033 kg)] 205 lb 1.6 oz (93.033 kg) (12/02 2057) Physical Exam: General: NAD  HEENT: NCAT, PERRLA, EOMI, neck with normal ROM  Cardiovascular: RRR, no murmurs; unable to palpate dps manually Respiratory: normal work of breathing, CTA-B  Abdomen: soft, NDNT  Extremities: Right foot with eschar of dorsal 5th digit and medial ulcertaion without drainage; Partial thickness would of lateral right 5th MTP joint without drainage or surround cellulitis; thickened callous with some drainage for right first MTP joint on plantar aspect; Left foot with callous numerous pre-ulcerative calluses, no nerve deficits to light touch Skin: aside from feet, no other rashes or ulceration  Neuro: alert and oriented x 3, no focal deficits    Laboratory:  Recent Labs Lab 01/04/13 1526 01/04/13 1708 01/05/13 0535  WBC 6.8  --  5.7  HGB 10.6* 11.9* 10.2*  HCT 32.2* 35.0* 30.3*  PLT 189  --  170    Recent Labs Lab 01/04/13 1526 01/04/13 1708 01/05/13 0535  NA 131*  136 139  K 6.0* 5.9* 4.7  CL 98 106 106  CO2 22  --  21  BUN 31* 32* 25*  CREATININE 1.33 1.40* 1.12  CALCIUM 10.4  --  9.2  GLUCOSE 350* 293* 270*    Imaging/Diagnostic Tests: Dg Foot 2 Views Right  01/04/2013 CLINICAL DATA: Diabetic foot ulcer. EXAM: RIGHT FOOT - 2 VIEW COMPARISON: None. FINDINGS: There is no evidence of fracture or dislocation. There is no evidence of arthropathy or other focal bone abnormality. Vascular calcifications are noted. IMPRESSION: No acute abnormality seen in  the right foot   Leonard Masker, MD 01/05/2013, 7:29 AM PGY-1, [place] Intern pager: [phone], text pages welcome

## 2013-01-05 NOTE — Consult Note (Signed)
[facility] NOTE   MRN : [id number]  Reason for Consult: right foot wound Referring Physician: Lupita Dawn, MD   History of Present Illness: 62 y.o. Male with right foot wound progressively getting worse over the past few months.   Past medical history include hypertension, hyperlipidemia and DM.    He was seeing podiatrist Harriet Masson on 11/16/12, who debrided tissue, applied silvadene dressing and gave prescription for Keflex. The patient was supposed to follow up in 2-3 weeks but never did. He came into the ED today because his neighbor told him to. In the ED at [facility] there was concern for infection with gangrene, and the patient was also found to have hyperkalemia (6.0). Therefore, he was admitted for further care. Currently, he denies pain in that area. He denies fever or chills.  Since his admission, he has been ordered to start on Zosyn and Vancomycin.  He has had ABI's and his results are below.  He is  on Atenolol, Norvasc, and insulin for DM.  He is also on Pravachol for his hyperlipidemia.     Current Facility-Administered Medications  Medication Dose Route Frequency Provider Last Rate Last Dose  . amLODipine (NORVASC) tablet 10 mg  10 mg Oral Daily Angelica Ran, MD   10 mg at 01/05/13 0959  . aspirin EC tablet 81 mg  81 mg Oral Daily Angelica Ran, MD   81 mg at 01/05/13 0959  . atenolol (TENORMIN) tablet 100 mg  100 mg Oral Daily Angelica Ran, MD   100 mg at 01/05/13 0959  . cloNIDine (CATAPRES) tablet 0.2 mg  0.2 mg Oral BID Angelica Ran, MD   0.2 mg at 01/05/13 0959  . enoxaparin (LOVENOX) injection 40 mg  40 mg Subcutaneous Q24H Angelica Ran, MD   40 mg at 01/04/13 2307  . HYDROcodone-acetaminophen (NORCO/VICODIN) 5-325 MG per tablet 1-2 tablet  1-2 tablet Oral Q4H PRN Angelica Ran, MD      . insulin aspart (novoLOG) injection 0-15 Units  0-15 Units Subcutaneous TID WC Angelica Ran,  MD   5 Units at 01/05/13 0805  . insulin aspart (novoLOG) injection 4 Units  4 Units Subcutaneous TID WC Angelica Ran, MD   4 Units at 01/05/13 [phone]  . insulin glargine (LANTUS) injection 40 Units  40 Units Subcutaneous QHS Langston Masker, MD      . piperacillin-tazobactam (ZOSYN) IVPB 3.375 g  3.375 g Intravenous Q8H Dareen Piano, RPH   3.375 g at 01/05/13 0805  . simvastatin (ZOCOR) tablet 20 mg  20 mg Oral q1800 Angelica Ran, MD      . sodium chloride 0.9 % injection 3 mL  3 mL Intravenous Q12H Angelica Ran, MD      . vancomycin (VANCOCIN) IVPB 1000 mg/200 mL premix  1,000 mg Intravenous Q12H Dareen Piano, [facility]        Pt meds include: Statin :Yes Betablocker: Yes ASA: No Other anticoagulants/antiplatelets: no  Past Medical History  Diagnosis Date  . Diabetes mellitus   . Hypertension     Past Surgical History  Procedure Laterality Date  . Throat surgery  1983    polyps    Social History History  Substance Use Topics  . Smoking status: Current Every Day Smoker -- 1.00 packs/day for 44 years    Types: Cigarettes  . Smokeless tobacco: Never Used  . Alcohol Use: No    Family History  Family History  Problem Relation Age of Onset  . Cancer Mother     lung cancer  . Hypertension Mother   . Cancer Sister     patient thinks it was uterine cancer  . Hypertension Sister     No Known Allergies   REVIEW OF SYSTEMS  General: [ ]  Weight loss, [ ]  Fever, [ ]  chills Neurologic: [ ]  Dizziness, [ ]  Blackouts, [ ]  Seizure [ ]  Stroke, [ ]  "Mini stroke", [ ]  Slurred speech, [ ]  Temporary blindness; [ ]  weakness in arms or legs, [ ]  Hoarseness [ ]  Dysphagia Cardiac: [ ]  Chest pain/pressure, [ ]  Shortness of breath at rest [ ]  Shortness of breath with exertion, [ ]  Atrial fibrillation or irregular heartbeat  Vascular: [ ]  Pain in legs with walking, [ ]  Pain in legs at rest, [ ]  Pain in legs at night,  [ ]  Non-healing ulcer, [ ]  Blood clot in  vein/DVT,   Pulmonary: [ ]  Home oxygen, [ ]  Productive cough, [ ]  Coughing up blood, [ ]  Asthma,  [ ]  Wheezing [ ]  COPD Musculoskeletal:  [ ]  Arthritis, [ ]  Low back pain, [ ]  Joint pain Hematologic: [ ]  Easy Bruising, [ ]  Anemia; [ ]  Hepatitis Gastrointestinal: [ ]  Blood in stool, [ ]  Gastroesophageal Reflux/heartburn, Urinary: [ ]  chronic Kidney disease, [ ]  on HD - [ ]  MWF or [ ]  TTHS, [ ]  Burning with urination, [ ]  Difficulty urinating Skin: [ ]  Rashes, [ ]  Wounds Psychological: [ ]  Anxiety, [ ]  Depression  Physical Examination Filed Vitals:   01/04/13 1414 01/04/13 1827 01/04/13 2057 01/05/13 0603  BP: 179/79 152/76 158/74 139/67  Pulse: 72 74 76 71  Temp: 98.1 F (36.7 C) 99 F (37.2 C) 98.3 F (36.8 C) 98.5 F (36.9 C)  TempSrc: Oral Oral Oral Oral  Resp: 20 18 20 20   Height:   5\' 8"  (1.727 m)   Weight:   205 lb 1.6 oz (93.033 kg)   SpO2: 99% 97% 96% 98%   Body mass index is 31.19 kg/(m^2).  General:  WDWN in NAD Gait: Normal HENT: WNL Eyes: Pupils equal Pulmonary: normal non-labored breathing , without Rales, rhonchi,  wheezing Cardiac: RRR, without  Murmurs, rubs or gallops; No carotid bruits Abdomen: soft, NT, no masses  Vascular Exam/Pulses:Right 5th toe Black/ dry gangrene.  Lateral 5th MT head wound.  Plantar right 1st MTP joint callus with central wound.  No active drainage.   Skin warm to touch. Doppler DP right.     Musculoskeletal: no muscle wasting or atrophy; no edema  Neurologic: A&O X 3; Appropriate Affect ;  SENSATION: normal; MOTOR FUNCTION: 5/5 Symmetric Speech is fluent/normal   Significant Diagnostic Studies: CBC Lab Results  Component Value Date   WBC 5.7 01/05/2013   HGB 10.2* 01/05/2013   HCT 30.3* 01/05/2013   MCV 82.8 01/05/2013   PLT 170 01/05/2013    BMET    Component Value Date/Time   NA 139 01/05/2013 0535   K 4.7 01/05/2013 0535   CL 106 01/05/2013 0535   CO2 21 01/05/2013 0535   GLUCOSE 270* 01/05/2013 0535   BUN 25*  01/05/2013 0535   CREATININE 1.12 01/05/2013 0535   CREATININE 0.98 11/25/2012 1546   CALCIUM 9.2 01/05/2013 0535   GFRNONAA 69* 01/05/2013 0535   GFRAA 80* 01/05/2013 0535   Estimated Creatinine Clearance: 76.6 ml/min (by C-G formula based on Cr of 1.12).  COAG No results found for this basename:  INR, PROTIME     Non-Invasive Vascular Imaging:  ABI's 01/05/13 as follows:  RIGHT    LEFT     PRESSURE  WAVEFORM   PRESSURE  WAVEFORM   BRACHIAL  147  Tri  BRACHIAL  136  Tri   DP    DP     AT  127  Damp mono  AT  79  Damp mono   PT  120  mono  PT  100  mono   PER    PER     GREAT TOE   NA  GREAT TOE   NA     RIGHT  LEFT   ABI  0.86  0.68      ASSESSMENT/PLAN:  DM, smoker, HTN and hypercholesterolemia. Right foot 5th toe black/dry gangrene 5th metatarsal head lateral wound Plantar 1st MTP joint callus with wound Continue the antibiotics. We will schedule him for angiogram with run off to determine his arterial flow is adequate for revascularization and/or to heal a 5th digit/metatarsal ray amputation.       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 Will need extensive lateral foot amputation which is doubtful to heal  Plan angiogram the next few days to see if revascularization or percutaneous intervention is feasible

## 2013-01-05 NOTE — Progress Notes (Signed)
Pt admitted to unit from [facility] ED via [place]. Pt is A&O & VS stable. Pt has a diabetic ulcer to R 5th toe & lateral foot & calluses on bilateral feet. Pt is oriented to unit, safety video viewed, and call bell within reach. Will continue to monitor.

## 2013-01-05 NOTE — H&P (Signed)
FMTS Attending Note  I personally saw and evaluated the patient. The plan of care was discussed with the resident team. I agree with the assessment and plan as documented by the resident.   62 y/o male with PMH HTN, Diabetes, HLD presents for evaluation of worsening right foot wound. He was found to be hyperkalemic in the [facility] ER and treated with calcium gluconate/insulin/and kayexalate. Please refer to resident note for HPI. Patient currently without complaint. Foot would has been been progressively worsening over the past few months, denies pain in the area, no discharge, no chest pain, no sob  Vitals: reviewed Gen: pleasant AAM, NAD HEENT: normocephalic, pupils equal in size, MMM, neck supple Cardac: RRR, S1 and S2 present, no murmurs, no heaves/thrills Resp: CTAB, normal effort Abd: soft, no tenderness, bowel sounds present Ext: no edema, right 5th digit- dry gangrene present, no surrounding erythema, no drainage; right 1st digit - callus on the plantar aspect over the MTP joint; left 5 digit - callus over dorsal aspect of 1st IP joint, unable to palpate DP or PT pulses in bilateral feet, poor hair growth  1. Foot wound/dry gangrene - agree with empiric antibiotic coverage for pseudomonas, check ABI's and consult Vascular Surgery for likely amputation of digit 2. Hyperkalemia - agree with kayexalate, monitor BMETs, hold Lisinopril/Triamterene 3. Diabetes - uncontrolled, suspect noncompliance with home medications, agree with insulin regimen as documented by the resident 4. Hypertension - currently stable, monitor off lisinopril/tiramterene 5. HLD - continue statin  Dossie Arbour MD

## 2013-01-05 NOTE — Progress Notes (Signed)
VASCULAR LAB PRELIMINARY  ABI completed:    RIGHT    LEFT    PRESSURE WAVEFORM  PRESSURE WAVEFORM  BRACHIAL 147 Tri BRACHIAL 136 Tri  DP   DP    AT 127 Damp mono AT 79 Damp mono  PT 120 mono PT 100 mono  PER   PER    GREAT TOE  NA GREAT TOE  NA    RIGHT LEFT  ABI 0.86 0.68     Landry Mellow, RDMS, RVT  01/05/2013, 11:43 AM

## 2013-01-05 NOTE — Consult Note (Signed)
WOC wound consult note Reason for Consult: Consult requested for bilat feet.   Wound type: Dry callous areas to BLE as follows:Marland Kitchen Measurement: Right outer foot 1.3X1.3cm, Left outer foot 1.2X1.2cm, dry healed areas to right plantar foot. Wound bed: No open wounds, drainage, fluctuance, or odor.   Dressing procedure/placement/frequency: Bactroban to assist with moist healing.  Pt has 5th right toe with 100% black eschar.  This is beyond [facility] [place] scope of practice. VVS service consult is pending according to progress notes. Please refer to them for further plan of care. Please re-consult if further assistance is needed.  Thank-you,  Julien Girt MSN, [place], [place], [place], [place]

## 2013-01-05 NOTE — Progress Notes (Signed)
Inpatient Diabetes Program Recommendations  AACE/ADA: New Consensus Statement on Inpatient Glycemic Control (2013)  Target Ranges:  Prepandial:   less than 140 mg/dL      Peak postprandial:   less than 180 mg/dL (1-2 hours)      Critically ill patients:  140 - 180 mg/dL   Reason for Visit: Results for Leonard [name], Leonard [name] (MRN [id number]) as of 01/05/2013 14:18  Ref. Range 01/04/2013 18:19 01/04/2013 19:44 01/04/2013 22:43 01/05/2013 07:40 01/05/2013 12:53  Glucose-Capillary Latest Range: 70-99 mg/dL 258 (H) 164 (H) 173 (H) 224 (H) 270 (H)   Spoke to patient regarding home glycemic control.  He states that he does not check his blood sugars at home and was not aware that CBG's were higher.  States he does take Lantus 50 units daily at home consistently.  He see's MD at [facility].    Please consider increasing Lantus to 50 units daily (this is home dose).  CBG's could be increased due to infection.  Will follow.

## 2013-01-06 ENCOUNTER — Encounter (HOSPITAL_COMMUNITY)
Admission: EM | Disposition: A | Discharge status: Home / Self Care | Payer: Self-pay | Source: Home / Self Care | Attending: Family Medicine

## 2013-01-06 DIAGNOSIS — I70269 Atherosclerosis of native arteries of extremities with gangrene, unspecified extremity: Secondary | ICD-10-CM

## 2013-01-06 DIAGNOSIS — I119 Hypertensive heart disease without heart failure: Secondary | ICD-10-CM

## 2013-01-06 HISTORY — PX: LOWER EXTREMITY ANGIOGRAM: SHX5508

## 2013-01-06 LAB — CBC
Hemoglobin: 11 g/dL — ABNORMAL LOW (ref 13.0–17.0)
MCV: 83.8 fL (ref 78.0–100.0)
Platelets: 198 10*3/uL (ref 150–400)
RBC: 4 MIL/uL — ABNORMAL LOW (ref 4.22–5.81)
WBC: 6.9 10*3/uL (ref 4.0–10.5)

## 2013-01-06 LAB — GLUCOSE, CAPILLARY
Glucose-Capillary: 270 mg/dL — ABNORMAL HIGH (ref 70–99)
Glucose-Capillary: 188 mg/dL — ABNORMAL HIGH (ref 70–99)
Glucose-Capillary: 201 mg/dL — ABNORMAL HIGH (ref 70–99)
Glucose-Capillary: 299 mg/dL — ABNORMAL HIGH (ref 70–99)
Glucose-Capillary: 288 mg/dL — ABNORMAL HIGH (ref 70–99)

## 2013-01-06 LAB — CREATININE, SERUM
Creatinine, Ser: 1.12 mg/dL (ref 0.50–1.35)
GFR calc Af Amer: 80 mL/min — ABNORMAL LOW (ref >90)

## 2013-01-06 SURGERY — ANGIOGRAM, LOWER EXTREMITY
Anesthesia: LOCAL | Laterality: Bilateral

## 2013-01-06 MED ORDER — MIDAZOLAM HCL 2 MG/2ML IJ SOLN
INTRAMUSCULAR | Status: AC
  Filled 2013-01-06: qty 2

## 2013-01-06 MED ORDER — ENOXAPARIN SODIUM 40 MG/0.4ML ~~LOC~~ SOLN
40.0000 mg | SUBCUTANEOUS | Status: DC
  Administered 2013-01-07: 40 mg via SUBCUTANEOUS
  Filled 2013-01-06 (×2): qty 0.4

## 2013-01-06 MED ORDER — INSULIN GLARGINE 100 UNIT/ML ~~LOC~~ SOLN
50.0000 [IU] | Freq: Every day | SUBCUTANEOUS | Status: DC
  Administered 2013-01-06: 50 [IU] via SUBCUTANEOUS
  Filled 2013-01-06 (×2): qty 0.5

## 2013-01-06 MED ORDER — SODIUM CHLORIDE 0.9 % IV SOLN: 1.0000 mL/kg/h | INTRAVENOUS | Status: AC

## 2013-01-06 MED ORDER — MORPHINE SULFATE 2 MG/ML IJ SOLN: 2.0000 mg | INTRAMUSCULAR | Status: DC | PRN

## 2013-01-06 MED ORDER — LIDOCAINE HCL (PF) 1 % IJ SOLN
INTRAMUSCULAR | Status: AC
Start: 2013-01-06 — End: 2013-01-06
  Filled 2013-01-06: qty 30

## 2013-01-06 MED ORDER — HEPARIN (PORCINE) IN NACL 2-0.9 UNIT/ML-% IJ SOLN
INTRAMUSCULAR | Status: AC
  Filled 2013-01-06: qty 1000

## 2013-01-06 MED ORDER — OXYCODONE-ACETAMINOPHEN 5-325 MG PO TABS: 1.0000 | ORAL_TABLET | Freq: Four times a day (QID) | ORAL | Status: DC | PRN

## 2013-01-06 MED ORDER — ONDANSETRON HCL 4 MG/2ML IJ SOLN: 4.0000 mg | Freq: Four times a day (QID) | INTRAMUSCULAR | Status: DC | PRN

## 2013-01-06 MED ORDER — ACETAMINOPHEN 325 MG PO TABS: 650.0000 mg | ORAL_TABLET | ORAL | Status: DC | PRN

## 2013-01-06 MED ORDER — SODIUM CHLORIDE 0.9 % IV SOLN
INTRAVENOUS | Status: DC
  Administered 2013-01-06: 08:00:00 1000 mL via INTRAVENOUS
  Administered 2013-01-06: 100 mL/h via INTRAVENOUS
  Administered 2013-01-07: 05:00:00 via INTRAVENOUS

## 2013-01-06 MED ORDER — FENTANYL CITRATE 0.05 MG/ML IJ SOLN
INTRAMUSCULAR | Status: AC
  Filled 2013-01-06: qty 2

## 2013-01-06 NOTE — H&P (View-Only) (Signed)
[name] NOTE   MRN : [id number]  Reason for Consult: right foot wound Referring Physician: Lupita Dawn, MD   History of Present Illness: 62 y.o. Male with right foot wound progressively getting worse over the past few months.   Past medical history include hypertension, hyperlipidemia and DM.    He was seeing podiatrist Harriet Masson on 11/16/12, who debrided tissue, applied silvadene dressing and gave prescription for Keflex. The patient was supposed to follow up in 2-3 weeks but never did. He came into the ED today because his neighbor told him to. In the ED at [facility] there was concern for infection with gangrene, and the patient was also found to have hyperkalemia (6.0). Therefore, he was admitted for further care. Currently, he denies pain in that area. He denies fever or chills.  Since his admission, he has been ordered to start on Zosyn and Vancomycin.  He has had ABI's and his results are below.  He is  on Atenolol, Norvasc, and insulin for DM.  He is also on Pravachol for his hyperlipidemia.     Current Facility-Administered Medications  Medication Dose Route Frequency Provider Last Rate Last Dose  . amLODipine (NORVASC) tablet 10 mg  10 mg Oral Daily Angelica Ran, MD   10 mg at 01/05/13 0959  . aspirin EC tablet 81 mg  81 mg Oral Daily Angelica Ran, MD   81 mg at 01/05/13 0959  . atenolol (TENORMIN) tablet 100 mg  100 mg Oral Daily Angelica Ran, MD   100 mg at 01/05/13 0959  . cloNIDine (CATAPRES) tablet 0.2 mg  0.2 mg Oral BID Angelica Ran, MD   0.2 mg at 01/05/13 0959  . enoxaparin (LOVENOX) injection 40 mg  40 mg Subcutaneous Q24H Angelica Ran, MD   40 mg at 01/04/13 2307  . HYDROcodone-acetaminophen (NORCO/VICODIN) 5-325 MG per tablet 1-2 tablet  1-2 tablet Oral Q4H PRN Angelica Ran, MD      . insulin aspart (novoLOG) injection 0-15 Units  0-15 Units Subcutaneous TID WC Angelica Ran,  MD   5 Units at 01/05/13 0805  . insulin aspart (novoLOG) injection 4 Units  4 Units Subcutaneous TID WC Angelica Ran, MD   4 Units at 01/05/13 [phone]  . insulin glargine (LANTUS) injection 40 Units  40 Units Subcutaneous QHS Langston Masker, MD      . piperacillin-tazobactam (ZOSYN) IVPB 3.375 g  3.375 g Intravenous Q8H Dareen Piano, RPH   3.375 g at 01/05/13 0805  . simvastatin (ZOCOR) tablet 20 mg  20 mg Oral q1800 Angelica Ran, MD      . sodium chloride 0.9 % injection 3 mL  3 mL Intravenous Q12H Angelica Ran, MD      . vancomycin (VANCOCIN) IVPB 1000 mg/200 mL premix  1,000 mg Intravenous Q12H Dareen Piano, [facility]        Pt meds include: Statin :Yes Betablocker: Yes ASA: No Other anticoagulants/antiplatelets: no  Past Medical History  Diagnosis Date  . Diabetes mellitus   . Hypertension     Past Surgical History  Procedure Laterality Date  . Throat surgery  1983    polyps    Social History History  Substance Use Topics  . Smoking status: Current Every Day Smoker -- 1.00 packs/day for 44 years    Types: Cigarettes  . Smokeless tobacco: Never Used  . Alcohol Use: No    Family History  Family History  Problem Relation Age of Onset  . Cancer Mother     lung cancer  . Hypertension Mother   . Cancer Sister     patient thinks it was uterine cancer  . Hypertension Sister     No Known Allergies   REVIEW OF SYSTEMS  General: [ ]  Weight loss, [ ]  Fever, [ ]  chills Neurologic: [ ]  Dizziness, [ ]  Blackouts, [ ]  Seizure [ ]  Stroke, [ ]  "Mini stroke", [ ]  Slurred speech, [ ]  Temporary blindness; [ ]  weakness in arms or legs, [ ]  Hoarseness [ ]  Dysphagia Cardiac: [ ]  Chest pain/pressure, [ ]  Shortness of breath at rest [ ]  Shortness of breath with exertion, [ ]  Atrial fibrillation or irregular heartbeat  Vascular: [ ]  Pain in legs with walking, [ ]  Pain in legs at rest, [ ]  Pain in legs at night,  [ ]  Non-healing ulcer, [ ]  Blood clot in  vein/DVT,   Pulmonary: [ ]  Home oxygen, [ ]  Productive cough, [ ]  Coughing up blood, [ ]  Asthma,  [ ]  Wheezing [ ]  COPD Musculoskeletal:  [ ]  Arthritis, [ ]  Low back pain, [ ]  Joint pain Hematologic: [ ]  Easy Bruising, [ ]  Anemia; [ ]  Hepatitis Gastrointestinal: [ ]  Blood in stool, [ ]  Gastroesophageal Reflux/heartburn, Urinary: [ ]  chronic Kidney disease, [ ]  on HD - [ ]  MWF or [ ]  TTHS, [ ]  Burning with urination, [ ]  Difficulty urinating Skin: [ ]  Rashes, [ ]  Wounds Psychological: [ ]  Anxiety, [ ]  Depression  Physical Examination Filed Vitals:   01/04/13 1414 01/04/13 1827 01/04/13 2057 01/05/13 0603  BP: 179/79 152/76 158/74 139/67  Pulse: 72 74 76 71  Temp: 98.1 F (36.7 C) 99 F (37.2 C) 98.3 F (36.8 C) 98.5 F (36.9 C)  TempSrc: Oral Oral Oral Oral  Resp: 20 18 20 20   Height:   5\' 8"  (1.727 m)   Weight:   205 lb 1.6 oz (93.033 kg)   SpO2: 99% 97% 96% 98%   Body mass index is 31.19 kg/(m^2).  General:  WDWN in NAD Gait: Normal HENT: WNL Eyes: Pupils equal Pulmonary: normal non-labored breathing , without Rales, rhonchi,  wheezing Cardiac: RRR, without  Murmurs, rubs or gallops; No carotid bruits Abdomen: soft, NT, no masses  Vascular Exam/Pulses:Right 5th toe Black/ dry gangrene.  Lateral 5th MT head wound.  Plantar right 1st MTP joint callus with central wound.  No active drainage.   Skin warm to touch. Doppler DP right.     Musculoskeletal: no muscle wasting or atrophy; no edema  Neurologic: A&O X 3; Appropriate Affect ;  SENSATION: normal; MOTOR FUNCTION: 5/5 Symmetric Speech is fluent/normal   Significant Diagnostic Studies: CBC Lab Results  Component Value Date   WBC 5.7 01/05/2013   HGB 10.2* 01/05/2013   HCT 30.3* 01/05/2013   MCV 82.8 01/05/2013   PLT 170 01/05/2013    BMET    Component Value Date/Time   NA 139 01/05/2013 0535   K 4.7 01/05/2013 0535   CL 106 01/05/2013 0535   CO2 21 01/05/2013 0535   GLUCOSE 270* 01/05/2013 0535   BUN 25*  01/05/2013 0535   CREATININE 1.12 01/05/2013 0535   CREATININE 0.98 11/25/2012 1546   CALCIUM 9.2 01/05/2013 0535   GFRNONAA 69* 01/05/2013 0535   GFRAA 80* 01/05/2013 0535   Estimated Creatinine Clearance: 76.6 ml/min (by C-G formula based on Cr of 1.12).  COAG No results found for this basename:  INR, PROTIME     Non-Invasive Vascular Imaging:  ABI's 01/05/13 as follows:  RIGHT    LEFT     PRESSURE  WAVEFORM   PRESSURE  WAVEFORM   BRACHIAL  147  Tri  BRACHIAL  136  Tri   DP    DP     AT  127  Damp mono  AT  79  Damp mono   PT  120  mono  PT  100  mono   PER    PER     GREAT TOE   NA  GREAT TOE   NA     RIGHT  LEFT   ABI  0.86  0.68      ASSESSMENT/PLAN:  DM, smoker, HTN and hypercholesterolemia. Right foot 5th toe black/dry gangrene 5th metatarsal head lateral wound Plantar 1st MTP joint callus with wound Continue the antibiotics. We will schedule him for angiogram with run off to determine his arterial flow is adequate for revascularization and/or to heal a 5th digit/metatarsal ray amputation.       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 Will need extensive lateral foot amputation which is doubtful to heal  Plan angiogram the next few days to see if revascularization or percutaneous intervention is feasible

## 2013-01-06 NOTE — Progress Notes (Signed)
FMTS Attending Daily Note:  Annabell Sabal MD  [phone] pager  Family Practice pager:  [phone] I have seen and examined this patient and have reviewed their chart. I have discussed this patient with the resident. I agree with the resident's findings, assessment and care plan.  Additionally:  With dry gangrene of Right 5th digit and very dusky appearance of Left 5th digit.  RRR, lungs clear.  I cannot palpate any pulses BL. - Appreciate Vascular input and aortogram. - Decision now is how far up foot to make amputation.  I discussed this with patient and he seem surprised and upset, stated no one else had mentioned "taking off more than my toe."    Alveda Reasons, MD 01/06/2013 3:10 PM

## 2013-01-06 NOTE — Op Note (Signed)
OPERATIVE NOTE   PROCEDURE: 1.  Left common femoral artery cannulation under ultrasound guidance 2.  Placement of catheter in aorta 3.  Aortogram 4.  Second order arterial selection 5.  Right leg angiogram 6.  Left leg angiogram  PRE-OPERATIVE DIAGNOSIS: right foot gangrene  POST-OPERATIVE DIAGNOSIS: same as above   SURGEON: Adele Barthel, MD  ANESTHESIA: conscious sedation  ESTIMATED BLOOD LOSS: 50 cc  CONTRAST: 180 cc  FINDING(S):  Aorta: patent abdominal aortic aneurysm   Superior mesenteric artery: patent Celiac artery: not visualized   Right Left  RA Patent Patent  CIA Patent with aneurysm (1.9 cm) Patent with aneurysm (1.8 cm)    EIA Patent Patent  IIA Occluded Patent with >90% proximal stenosis  CFA Patent Patent but heavily calcified  SFA Patent with calcified stenoses 50% and 75-90% distally Patent but heavily calcified with 75-90% stenosis in proximal 2 cm  PFA Patent Patent  Pop Patent Patent with diffusely diseased below-the-knee segment with 90% stenosis in distal segment  Trif Patent Heavily diseased and dimunitive  AT Patent diseased with 75-90% proximal stenosis and 75-90% stenosis in distal segment Occluded  Pero Proximally patent but occludes shortly after takeoff Patent but diseased with 90% stenosis proximally and 75-90% stenosis distally  PT Proximally patent but occludes shortly after takeoff Proximally patent but occludes shortly after takeoff   SPECIMEN(S):  none  INDICATIONS:   Leonard [name] is a 62 y.o. male who presents with right foot dry gangrene.  The patient presents for: aortogram and bilateral leg runoff.  I discussed with the patient the nature of angiographic procedures, especially the limited patencies of any endovascular intervention.  The patient is aware of that the risks of an angiographic procedure include but are not limited to: bleeding, infection, access site complications, renal failure, embolization, rupture of vessel,  dissection, possible need for emergent surgical intervention, possible need for surgical procedures to treat the patient's pathology, and stroke and death.  The patient is aware of the risks and agrees to proceed.  DESCRIPTION: After full informed consent was obtained from the patient, the patient was brought back to the angiography suite.  The patient was placed supine upon the angiography table and connected to monitoring equipment.  The patient was then given conscious sedation, the amounts of which are documented in the patient's chart.  The patient was prepped and drape in the standard fashion for an angiographic procedure.  At this point, attention was turned to the left groin.  Under ultrasound guidance, the left common femoral artery was cannulated with a 18 gauge needle.  The needle would not puncture the anterior plaque in the artery.  I reattempted this a little more proximal with a micropuncture needle.  This needle was able to gain lumen access.  The microwire was advanced into the iliac arterial system.  The needle was exchanged for a microsheath, which was loaded into the common femoral artery over the wire.  The microwire was exchanged for a [facility] wire which was advanced into the aorta.  The microsheath was then exchanged for a 5-Fr sheath which was loaded into the common femoral artery.  The Omniflush catheter was then loaded over the wire up to the level of L1.  The catheter was connected to the power injector circuit.  After de-airring and de-clotting the circuit, a power injector aortogram was completed.  The [facility] wire was replaced in the catheter, and using the [place] and Omniflush catheter, the right common iliac artery was selected.  The wire was advanced into the external iliac artery.  The catheter would not cross the bifurcation.  I exchanged the catheter for an end-hole catheter, but this combination kicked the catheter out.  Using the Omniflush and [facility] wire, I again selected the  right common iliac artery.  The wire was exchanged for a glidewire which was advanced into the common femoral artery.  The catheter was exchanged for an end-hole catheter, which could only be advanced into the proximal external iliac artery.  The wire was removed and the catheter was connected to the power injector.  The right leg angiogram was completed in stations due to poor patient cooperation.  The findings are listed above.  I then pulled out the catheter.  The sheath was aspirated.  No clots were present and the sheath was reloaded with heparinized saline.  The left femoral sheath was connected to the power injector.  The left leg angiogram was completed in stations.  The findings are above.  The sheath was aspirated.  No clots were present and the sheath was reloaded with heparinized saline.    COMPLICATIONS: none  CONDITION: stable  Adele Barthel, MD [facility] [place] Office: [phone] Pager: [phone]  01/06/2013, 9:12 AM

## 2013-01-06 NOTE — Progress Notes (Signed)
Family Medicine Teaching Service Daily Progress Note Intern Pager: [phone]  [name] Medical record number: [id number] Date of birth: 1950/09/01 Age: 62 y.o. Gender: male  Primary Care Provider: Clearance Coots, MD Consultants: Vasc Code Status: Full  Pt Overview and Leonard Events to Date:   Assessment and Plan: Leonard [name] is a 62 y.o. male presenting with a right foot wound with gangrene of the 5th digit and also found to have hyperkalemia. PMH is significant for uncontrolled DM and foot ulcer.   #Foot Wound - Appears to be very chronic with gangrene of the 5th digit of right foot that appears to be a vascular lesions with components of diabetic ulceration without surrounding cellulitis. Unable to palpate DP bilat manually. Vasc feels ABI to be falsely elevated - cont Vanc for MRSA coverage and Zosyn for pseudomonal coverage  - F/u blood cx; No wound drainage to culture  - wound consult pending - vasc  Following, angiogram today, appreciate management and recs  # Hyperkalemia - K+ 5.9 on admission to [place], no EKG changes from baseline; pt given calcium, insulin and kayexalate; etiology likely due to medications since lisinopril, triamterene, and potassium all on patient's list. Improved to 4.7 yesterday - Hold possible offending agents  - cont to trend daily   # Diabetes - A1C 11.7 - Hold home glipizide and metformin  - will go back to home dose lantus, which actually I believe is 50 - Medium sliding scale with 6 units with meals   # HTN - history of high BP (190s) when non adherent , 130s-140s here this am - Cont amlodipine, atenolol, and clonidine  - Hold lisinopril, triamterene/HCTZ given hyperkalemia   #anemia- drop in hgb 11.9 -> 10.2. Yesterday. Will trend with cbc post-op. EBL approx 180cc -monitor for hemodynamic stability  # HLD  - Cont statin   # Tobacco Abuse  - Consider patch -encourage cessation, esp in light of above  problems  FEN/GI: carb modified PPx: lovenox sq  Disposition: pending vasc recs, likely removal of gangrenous toe, angiogram  Subjective: no complaints this am, was being wheeled off to angiogram as I walked into the room  Objective: Temp:  [98 F (36.7 C)-99 F (37.2 C)] 99 F (37.2 C) (12/04 0614) Pulse Rate:  [69-72] 72 (12/04 0614) Resp:  [18-20] 20 (12/04 0614) BP: (135-162)/(70-81) 135/73 mmHg (12/04 0614) SpO2:  [96 %-99 %] 99 % (12/04 [id number]) Physical Exam: Unable to assess given that patient going to procedure as I was walking into room  Laboratory:  Recent Labs Lab 01/04/13 1526 01/04/13 1708 01/05/13 0535  WBC 6.8  --  5.7  HGB 10.6* 11.9* 10.2*  HCT 32.2* 35.0* 30.3*  PLT 189  --  170    Recent Labs Lab 01/04/13 1526 01/04/13 1708 01/05/13 0535  NA 131* 136 139  K 6.0* 5.9* 4.7  CL 98 106 106  CO2 22  --  21  BUN 31* 32* 25*  CREATININE 1.33 1.40* 1.12  CALCIUM 10.4  --  9.2  GLUCOSE 350* 293* 270*    Imaging/Diagnostic Tests: Dg Foot 2 Views Right  01/04/2013 CLINICAL DATA: Diabetic foot ulcer. EXAM: RIGHT FOOT - 2 VIEW COMPARISON: None. FINDINGS: There is no evidence of fracture or dislocation. There is no evidence of arthropathy or other focal bone abnormality. Vascular calcifications are noted. IMPRESSION: No acute abnormality seen in the right foot   Langston Masker, MD 01/06/2013, 7:27 AM PGY-1, [place] Intern  pager: [phone], text pages welcome

## 2013-01-06 NOTE — Interval H&P Note (Signed)
[facility] [place]  History and Physical Update  The patient was interviewed and re-examined.  The patient's previous History and Physical has been reviewed and is unchanged from Dr. Evelena Leyden consult.  There is no change in the plan of care: Aortogram, bilateral leg runoff, and possible intervention.  Laboratory: CBC:    Component Value Date/Time   WBC 5.7 01/05/2013 0535   RBC 3.66* 01/05/2013 0535   HGB 10.2* 01/05/2013 0535   HCT 30.3* 01/05/2013 0535   PLT 170 01/05/2013 0535   MCV 82.8 01/05/2013 0535   MCH 27.9 01/05/2013 0535   MCHC 33.7 01/05/2013 0535   RDW 13.4 01/05/2013 0535   LYMPHSABS 1.5 01/04/2013 1526   MONOABS 0.6 01/04/2013 1526   EOSABS 0.1 01/04/2013 1526   BASOSABS 0.0 01/04/2013 1526    BMP:    Component Value Date/Time   NA 139 01/05/2013 0535   K 4.7 01/05/2013 0535   CL 106 01/05/2013 0535   CO2 21 01/05/2013 0535   GLUCOSE 270* 01/05/2013 0535   BUN 25* 01/05/2013 0535   CREATININE 1.12 01/05/2013 0535   CREATININE 0.98 11/25/2012 1546   CALCIUM 9.2 01/05/2013 0535   GFRNONAA 69* 01/05/2013 0535   GFRAA 80* 01/05/2013 0535    Coagulation: No results found for this basename: INR, PROTIME   No results found for this basename: PTT      Adele Barthel, MD [facility] [place] Office: [phone] Pager: [phone]  01/06/2013, 8:29 AM

## 2013-01-07 ENCOUNTER — Encounter (HOSPITAL_COMMUNITY)
Admission: EM | Disposition: A | Discharge status: Home / Self Care | Payer: Self-pay | Source: Home / Self Care | Attending: Family Medicine

## 2013-01-07 LAB — BASIC METABOLIC PANEL
BUN: 15 mg/dL (ref 6–23)
CO2: 25 mEq/L (ref 19–32)
Chloride: 102 mEq/L (ref 96–112)
Creatinine, Ser: 1.16 mg/dL (ref 0.50–1.35)
GFR calc Af Amer: 77 mL/min — ABNORMAL LOW (ref >90)
GFR calc non Af Amer: 66 mL/min — ABNORMAL LOW (ref >90)
Potassium: 4.3 mEq/L (ref 3.5–5.1)

## 2013-01-07 LAB — CBC WITH DIFFERENTIAL/PLATELET
Basophils Absolute: 0 10*3/uL (ref 0.0–0.1)
Basophils Relative: 0 % (ref 0–1)
Eosinophils Relative: 3 % (ref 0–5)
Hemoglobin: 10.6 g/dL — ABNORMAL LOW (ref 13.0–17.0)
MCHC: 32.4 g/dL (ref 30.0–36.0)
Monocytes Absolute: 0.7 10*3/uL (ref 0.1–1.0)
Monocytes Relative: 10 % (ref 3–12)
Neutro Abs: 4.1 10*3/uL (ref 1.7–7.7)
Neutrophils Relative %: 62 % (ref 43–77)
RDW: 13.3 % (ref 11.5–15.5)
WBC: 6.5 10*3/uL (ref 4.0–10.5)

## 2013-01-07 LAB — GLUCOSE, CAPILLARY: Glucose-Capillary: 274 mg/dL — ABNORMAL HIGH (ref 70–99)

## 2013-01-07 SURGERY — ABDOMINAL AORTAGRAM
Anesthesia: LOCAL

## 2013-01-07 MED ORDER — VANCOMYCIN HCL 10 G IV SOLR
1250.0000 mg | Freq: Two times a day (BID) | INTRAVENOUS | Status: DC
  Filled 2013-01-07: qty 1250

## 2013-01-07 MED ORDER — INSULIN ASPART 100 UNIT/ML ~~LOC~~ SOLN
8.0000 [IU] | Freq: Three times a day (TID) | SUBCUTANEOUS | Status: DC
  Administered 2013-01-07: 13:00:00 via SUBCUTANEOUS

## 2013-01-07 MED ORDER — LISINOPRIL 20 MG PO TABS
20.0000 mg | ORAL_TABLET | Freq: Every day | ORAL | Status: DC
  Administered 2013-01-07: 20 mg via ORAL
  Filled 2013-01-07: qty 1

## 2013-01-07 NOTE — Progress Notes (Signed)
Inpatient Diabetes Program Recommendations  AACE/ADA: New Consensus Statement on Inpatient Glycemic Control (2013)  Target Ranges:  Prepandial:   less than 140 mg/dL      Peak postprandial:   less than 180 mg/dL (1-2 hours)      Critically ill patients:  140 - 180 mg/dL   Reason for Visit: Spoke briefly to patient regarding the importance of checking CBG's after discharge.  Briefly discussed hypoglycemia signs and symptoms and proper treatment.  Also discussed monitoring with patient.  He was able to teach back appropriately.  Will need to follow-up with PCP ASAP for follow-up regarding glycemic control.     Adah Perl, RN, BC-ADM Inpatient Diabetes Coordinator Pager [phone]

## 2013-01-07 NOTE — Progress Notes (Signed)
Patient ID: Leonard [name], male   DOB: 04/04/1950, 62 y.o.   MRN: [id number] Vascular Surgery Progress Note  Subjective: Dry gangrene right fifth toe with severe tibial occlusive disease-currently on vancomycin and Zosyn  Objective:  Filed Vitals:   01/07/13 0824  BP: 137/80  Pulse: 87  Temp: 98.3 F (36.8 C)  Resp: 18    Right fifth toe dry gangrene remained stable-no cellulitis-some ulceration between the fourth and fifth toes-dry ulcer over the fifth metatarsal head laterally with chronically ischemic skin changes   Labs:  Recent Labs Lab 01/05/13 0535 01/06/13 1255 01/07/13 0440  CREATININE 1.12 1.12 1.16    Recent Labs Lab 01/04/13 1526 01/04/13 1708 01/05/13 0535 01/06/13 1255 01/07/13 0440  NA 131* 136 139  --  138  K 6.0* 5.9* 4.7  --  4.3  CL 98 106 106  --  102  CO2 22  --  21  --  25  BUN 31* 32* 25*  --  15  CREATININE 1.33 1.40* 1.12 1.12 1.16  GLUCOSE 350* 293* 270*  --  308*  CALCIUM 10.4  --  9.2  --  9.2    Recent Labs Lab 01/05/13 0535 01/06/13 1255 01/07/13 0440  WBC 5.7 6.9 6.5  HGB 10.2* 11.0* 10.6*  HCT 30.3* 33.5* 32.7*  PLT 170 198 184   No results found for this basename: INR,  in the last 168 hours  I/O last 3 completed shifts: In: 992.5 [P.O.:480; I.V.:300; IV Piggyback:212.5] Out: [id number] [Urine:1250; Stool:1]  Imaging: No results found.  Assessment/Plan:  POD #1   LOS: 3 days  s/p Procedure(s): LOWER EXTREMITY ANGIOGRAM ABDOMINAL ANGIOGRAM  Angiogram revealed moderate stenosis in right SFA. One-vessel runoff through PT which has stenosis in proximal area Dr. Bridgett Larsson  plans orbital atherectomy sometime next week Okay to DC patient home and procedure can be done as outpatient. Following that he will need lateral foot amputation of fourth and fifth toes-ray   Tinnie Gens, MD 01/07/2013 10:26 AM

## 2013-01-07 NOTE — Progress Notes (Signed)
   Will aim for next Thursday 01/13/13 for orbital atherectomy and angioplasty of R superficial femoral artery and anterior tibial artery.  Adele Barthel, MD [facility] [place] Office: [phone] Pager: [phone]  01/07/2013, 12:37 PM

## 2013-01-07 NOTE — Discharge Summary (Signed)
[facility] Discharge Summary  [name] Medical record number: [id number] Date of birth: 11-27-1950 Age: 62 y.o. Gender: male Date of Admission: 01/04/2013  Date of Discharge: 01/07/13 Admitting Physician: Lupita Dawn, MD  Primary Care Provider: Clearance Coots, MD Consultants: Vasc surgery  Indication for Hospitalization: gangrenous 5th digit  Discharge Diagnoses/Problem List:  Patient Active Problem List   Diagnosis Date Noted  . Diabetic foot infection 01/04/2013  . Gangrene of toe 01/04/2013  . Diabetes mellitus, type 2 01/06/2012  . MIXED HYPERLIPIDEMIA 07/17/2008  . TOBACCO ABUSE 07/17/2008  . HYPERTENSION, HEART UNCONTROLLED W/O ASSOC CHF 07/17/2008    Disposition: home  Discharge Condition: stable  Discharge Exam:  BP 121/77  Pulse 76  Temp(Src) 98.5 F (36.9 C) (Oral)  Resp 18  Ht 5\' 8"  (624THL m)  Wt 205 lb 1.6 oz (93.033 kg)  BMI 31.19 kg/m2  SpO2 99% General: NAD, lying in bed on side HEENT: NCAT, MMM  Cardiovascular: RRR, no murmurs; unable to palpate dps manually  Respiratory: normal work of breathing, CTA-B, transmitted UAS  Abdomen: soft, NDNT, dressing over access site no palpable cords, or erythmea  Extremities: Right foot with eschar of dorsal 5th digit and medial ulcertaion without drainage; Partial thickness wound of lateral right 5th MTP joint without drainage or surround cellulitis; thickened callous with some drainage for right first MTP joint on plantar aspect; Left foot with callous numerous pre-ulcerative calluses, no nerve deficits to light touch (unchanged from exam on admission)  Skin: aside from feet, no other rashes or ulceration  Neuro: alert and oriented x 3, no focal deficits   Brief Hospital Course:  Leonard [name] is a 62 y.o. male presenting with a right foot wound with gangrene of the 5th digit and also found to have hyperkalemia. PMH is significant for uncontrolled DM and foot  ulcer.  #Foot Wound - Pt presented with right foot wounds noted by neighbor. Had seen Harriet Masson on 11/16/12 for debridement and silvadene dressing application as well as prescription for Keflex but did not f/up. Pt presented to [facility] afebrile, no white count, no claudication sx or other systemic illness. Was transferred to [facility] for continuity of care.Bcx were obtained and was placed on Vanc for MRSA and Zosyn for pseudomonal coverage. 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 Was unable to palpate DP bilat manually(obtained with doppler) but had palpable popliteal pulse. ABI obtained with findings of 0.86right/0.68 left. Vasc was consulted for further management. 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 Vasc felt that pt could be further w/up as outpt including orbital atherectomy and angioplasty of R superficial fem artery and anterior tib artery. Bcx neg X48hrs and did not appear acutely infected. Pt d/c to f/up with vasc as outpt. Will likely eventually need a lateral foot amputation.  #Hyperkalemia- K+ 5.9 on admission to [facility], no EKG changes from baseline; pt given calcium, insulin and kayexalate; etiology likely due to medications since lisinopril, triamterene, and potassium all on patient's list. Resolved within 24 hrs. D/c'd Kdur on discharge.  # Diabetes - A1C noted to be 11/25/12 and pt with hx of non adherence. Home glipizide and metformin held on admission. Pt placed on home lantus but at decreased dose (35). Was eventually titrated up given continual elevated CBGs. 8U of novolog with meals also  added. Repeat HA1C 11.7. Will need continued outpt titration.  # HTN - History of high BP (190s) when non adherent, was placed on amlodipine, atenolol and clonidine. Systolics were in [id number] range.  Lisinopril initially held given hyperkalemia but was added back slowly at half of home dose. D/c'd on home meds.   #Anemia- Hgb 15.0. Noted to be 10.6 on admission. Remained between 10-11. EBL with angiogram 180cc. Did not endorse GI bleed. Should have repeat CBC as outpt to trend.  # HLD- stable. Statin continued in house  # Tobacco Abuse- Current everyday smoker. Cessation was encouraged during admission esp in light of above problems. Not amenable at this time. Continual discussion as outpt recommended   Issues for Follow Up:  1. Orbital atherectomy  2. Diabetic medication management- A1C trending up 3. Hgb- 10.6 on d/c  Significant Procedures: angiogram (see OP note)  Significant Labs and Imaging:   Recent Labs Lab 01/05/13 0535 01/06/13 1255 01/07/13 0440  WBC 5.7 6.9 6.5  HGB 10.2* 11.0* 10.6*  HCT 30.3* 33.5* 32.7*  PLT 170 198 184    Recent Labs Lab 01/04/13 1526 01/04/13 1708 01/05/13 0535 01/06/13 1255 01/07/13 0440  NA 131* 136 139  --  138  K 6.0* 5.9* 4.7  --  4.3  CL 98 106 106  --  102  CO2 22  --  21  --  25  GLUCOSE 350* 293* 270*  --  308*  BUN 31* 32* 25*  --  15  CREATININE 1.33 1.40* 1.12 1.12 1.16  CALCIUM 10.4  --  9.2  --  9.2   Dg Foot 2 Views Right  01/04/2013 CLINICAL DATA: Diabetic foot ulcer. EXAM: RIGHT FOOT - 2 VIEW COMPARISON: None. FINDINGS: There is no evidence of fracture or dislocation. There is no evidence of arthropathy or other focal bone abnormality. Vascular calcifications are noted. IMPRESSION: No acute abnormality seen in the right foot   ABI- right 0.86/left 0.68  Angiogram- diffuse arterial disease and stenosis/occlusion   Results/Tests Pending at Time of Discharge: none  Discharge Medications:    Medication List    STOP taking these medications       potassium chloride SA 20 MEQ tablet  Commonly known as:  K-DUR,KLOR-CON      TAKE these medications       amLODipine 10 MG tablet  Commonly known as:  NORVASC   Take 1 tablet (10 mg total) by mouth daily.     atenolol 100 MG tablet  Commonly known as:  TENORMIN  Take 100 mg by mouth daily.     cloNIDine 0.2 MG tablet  Commonly known as:  CATAPRES  Take 1 tablet (0.2 mg total) by mouth 2 (two) times daily.     glipiZIDE 10 MG tablet  Commonly known as:  GLUCOTROL  Take 1 tablet (10 mg total) by mouth 2 (two) times daily before a meal.     insulin glargine 100 UNIT/ML injection  Commonly known as:  LANTUS  Inject 0.5 mLs (50 Units total) into the skin daily.     lisinopril 40 MG tablet  Commonly known as:  PRINIVIL,ZESTRIL  Take 1 tablet (40 mg total) by mouth daily.     metFORMIN 1000 MG tablet  Commonly known as:  GLUCOPHAGE  Take 1 tablet (1,000 mg total) by mouth 2 (two) times daily with a meal.     pravastatin 40 MG tablet  Commonly known as:  PRAVACHOL  Take 1 tablet (40 mg total)  by mouth daily.     triamterene-hydrochlorothiazide 75-50 MG per tablet  Commonly known as:  MAXZIDE  TAKE ONE TABLET BY MOUTH ONCE DAILY        Discharge Instructions: Please refer to Patient Instructions section of EMR for full details.  Patient was counseled important signs and symptoms that should prompt return to medical care, changes in medications, dietary instructions, activity restrictions, and follow up appointments.   Follow-Up Appointments: Follow-up Information   Follow up with Clearance Coots, MD On 01/11/2013. (at 2:30)    Specialty:  Family Medicine   Contact information:   [place] [address] [phone]       Follow up with Hinda Lenis, MD On 01/13/2013.   Specialty:  Vascular Surgery   Contact information:   [address] [place] [address] [phone]       Langston Masker, MD 01/09/2013, 5:58 PM PGY-1, [place]

## 2013-01-07 NOTE — Progress Notes (Signed)
Family Medicine Teaching Service Daily Progress Note Intern Pager: [phone]  [name] Medical record number: [id number] Date of birth: 04-Aug-1950 Age: 62 y.o. Gender: male  Primary Care Provider: Clearance Coots, MD Consultants: Vasc Code Status: Full  Pt Overview and Major Events to Date:   Assessment and Plan: Leonard [name] is a 62 y.o. male presenting with a right foot wound with gangrene of the 5th digit and also found to have hyperkalemia. PMH is significant for uncontrolled DM and foot ulcer.   #Foot Wound - Appears to be very chronic with gangrene of the 5th digit of right foot that clearly has both vascular lesions and components of diabetic ulceration without surrounding cellulitis. Unable to palpate DP bilat manually but palpable popliteal pulse. Had angiogram yesterday with extensive disease. Vasc feels that he will most likely need extensive lat food amputation doubtful to heal, may need to a BKA or AKA?  - cont Vanc for MRSA coverage and Zosyn for pseudomonal coverage, consider d/c afebrile and no white count - F/u blood cx NGTD X48hrs; No wound drainage to culture  - wound consult pending - vasc following, appreciate management and recs, may need to involve ortho if pt requires a more extensive amputation?  # Diabetes - A1C 11.7, elevated CBGs but didn't get all of meal time coverage yesterday 2/2 NPO yesterday - Hold home glipizide and metformin  - home dose lantus 50 - Medium sliding scale, with 8 units novolog with meals   # HTN - history of high BP (190s) when non adherent , 130s-140s  - Cont amlodipine, atenolol, and clonidine  - adding back lisinopril at half of home dose  #anemia- stable 10.6 this morning after yesterday's procedure, EBL approx 180cc -monitor for hemodynamic stability  # HLD  - Cont statin   # Tobacco Abuse  - Consider patch -encourage cessation, esp in light of above problems  FEN/GI: carb modified PPx: lovenox  sq  Disposition: pending vasc recs, likely removal of gangrenous toe or possibly high up amputation  Subjective: NAEON, doing well, eating breakfast this morning  Objective: Temp:  [98.2 F (36.8 C)-98.8 F (37.1 C)] 98.2 F (36.8 C) (12/05 0435) Pulse Rate:  [76-96] 76 (12/05 0435) Resp:  [16-18] 18 (12/05 0035) BP: (127-171)/(65-89) 143/65 mmHg (12/05 0435) SpO2:  [94 %-100 %] 95 % (12/05 0435) Physical Exam: General: NAD  HEENT: NCAT, MMM Cardiovascular: RRR, no murmurs; unable to palpate dps manually  Respiratory: normal work of breathing, CTA-B, transmitted UAS Abdomen: soft, NDNT, dressing over access site no palpable cords, or erythmea Extremities: Right foot with eschar of dorsal 5th digit and medial ulcertaion without drainage; Partial thickness wound of lateral right 5th MTP joint without drainage or surround cellulitis; thickened callous with some drainage for right first MTP joint on plantar aspect; Left foot with callous numerous pre-ulcerative calluses, no nerve deficits to light touch (unchanged from exam on admission) Skin: aside from feet, no other rashes or ulceration  Neuro: alert and oriented x 3, no focal deficits    Laboratory:  Recent Labs Lab 01/05/13 0535 01/06/13 1255 01/07/13 0440  WBC 5.7 6.9 6.5  HGB 10.2* 11.0* 10.6*  HCT 30.3* 33.5* 32.7*  PLT 170 198 184    Recent Labs Lab 01/04/13 1526 01/04/13 1708 01/05/13 0535 01/06/13 1255 01/07/13 0440  NA 131* 136 139  --  138  K 6.0* 5.9* 4.7  --  4.3  CL 98 106 106  --  102  CO2  22  --  21  --  25  BUN 31* 32* 25*  --  15  CREATININE 1.33 1.40* 1.12 1.12 1.16  CALCIUM 10.4  --  9.2  --  9.2  GLUCOSE 350* 293* 270*  --  308*    Imaging/Diagnostic Tests: Dg Foot 2 Views Right  01/04/2013 CLINICAL DATA: Diabetic foot ulcer. EXAM: RIGHT FOOT - 2 VIEW COMPARISON: None. FINDINGS: There is no evidence of fracture or dislocation. There is no evidence of arthropathy or other focal bone  abnormality. Vascular calcifications are noted. IMPRESSION: No acute abnormality seen in the right foot  Angiogram- diffuse arterial disease and stenosis/occlusion  Langston Masker, MD 01/07/2013, 7:47 AM PGY-1, [place] Intern pager: [phone], text pages welcome

## 2013-01-07 NOTE — Progress Notes (Signed)
ANTIBIOTIC CONSULT NOTE - FOLLOW UP  Pharmacy Consult for vancomycin/zosyn Indication: Diabetic foot infection  No Known Allergies  Patient Measurements: Height: 5\' 8"  (172.7 cm) Weight: 205 lb 1.6 oz (93.033 kg) IBW/kg (Calculated) : 68.4 Adjusted Body Weight: 78.2 kg  Vital Signs: Temp: 98.5 F (36.9 C) (12/05 1218) Temp src: Oral (12/05 1218) BP: 121/77 mmHg (12/05 1218) Pulse Rate: 76 (12/05 1218) Intake/Output from previous day: 12/04 0701 - 12/05 0700 In: 872.5 [P.O.:360; I.V.:300; IV Piggyback:212.5] Out: 501 [Urine:500; Stool:1] Intake/Output from this shift: Total I/O In: 360 [P.O.:360] Out: -   Labs:  Recent Labs  01/05/13 0535 01/06/13 1255 01/07/13 0440  WBC 5.7 6.9 6.5  HGB 10.2* 11.0* 10.6*  PLT 170 198 184  CREATININE 1.12 1.12 1.16   Estimated Creatinine Clearance: 74 ml/min (by C-G formula based on Cr of 1.16). No results found for this basename: VANCOTROUGH, Corlis Leak, VANCORANDOM, [place], [place], [place], [place], [place], [place], AMIKACINPEAK, AMIKACINTROU, AMIKACIN,  in the last 72 hours   Microbiology: Recent Results (from the past 720 hour(s))  CULTURE, BLOOD (ROUTINE X 2)     Status: None   Collection Time    01/04/13  3:50 PM      Result Value Range Status   Specimen Description BLOOD LEFT ARM   Final   Special Requests BOTTLES DRAWN AEROBIC AND ANAEROBIC 5CC   Final   Culture  Setup Time     Final   Value: 01/04/2013 20:30     Performed at [facility]   Culture     Final   Value:        BLOOD CULTURE RECEIVED NO GROWTH TO DATE CULTURE WILL BE HELD FOR 5 DAYS BEFORE ISSUING A FINAL NEGATIVE REPORT     Performed at [facility]   Report Status PENDING   Incomplete  CULTURE, BLOOD (ROUTINE X 2)     Status: None   Collection Time    01/04/13  3:55 PM      Result Value Range Status   Specimen Description BLOOD RIGHT ANTECUBITAL   Final   Special Requests BOTTLES DRAWN AEROBIC AND ANAEROBIC 5CC    Final   Culture  Setup Time     Final   Value: 01/04/2013 20:31     Performed at [facility]   Culture     Final   Value:        BLOOD CULTURE RECEIVED NO GROWTH TO DATE CULTURE WILL BE HELD FOR 5 DAYS BEFORE ISSUING A FINAL NEGATIVE REPORT     Performed at [facility]   Report Status PENDING   Incomplete  MRSA PCR SCREENING     Status: None   Collection Time    01/05/13 12:50 AM      Result Value Range Status   MRSA by PCR NEGATIVE  NEGATIVE Final   Comment:            The GeneXpert MRSA Assay (FDA     approved for NASAL specimens     only), is one component of a     comprehensive MRSA colonization     surveillance program. It is not     intended to diagnose MRSA     infection nor to guide or     monitor treatment for     MRSA infections.    Anti-infectives   Start     Dose/Rate Route Frequency Ordered Stop   01/05/13 1200  vancomycin (VANCOCIN) IVPB 1000 mg/200 mL premix  1,000 mg 200 mL/hr over 60 Minutes Intravenous Every 12 hours 01/04/13 2252     01/04/13 2359  vancomycin (VANCOCIN) 1,500 mg in sodium chloride 0.9 % 500 mL IVPB     1,500 mg 250 mL/hr over 120 Minutes Intravenous  Once 01/04/13 2252 01/05/13 0155   01/04/13 2359  piperacillin-tazobactam (ZOSYN) IVPB 3.375 g     3.375 g 12.5 mL/hr over 240 Minutes Intravenous Every 8 hours 01/04/13 2252        Assessment: 62 yo male presented with ulcers on his right foot.  Has dry gangrene on right fifth toe, vascular lesions, cellulitis.  Afebrile, WBC wnl.  Renal function has improved, SCr down from 1.4 to 1.16.  Goal of Therapy:  Vancomycin trough level 15-20 mcg/ml  Plan: Zosyn 3.375 mg IV q8h Increase Vanc to 1250 mg IV q12h  Hughes Better, PharmD, BCPS Clinical Pharmacist 01/07/2013,2:46 PM

## 2013-01-07 NOTE — Progress Notes (Signed)
FMTS Attending Daily Note:  Leonard Sabal MD  [phone] pager  Family Practice pager:  [phone] I have discussed this patient with the resident Dr. Skeet Simmer. I agree with their findings, assessment, and care plan.  Patient without complaints, doing well.  Plan is for outpatient atherectomy/angioplasty and amputation.  OK to DC home today.

## 2013-01-08 ENCOUNTER — Encounter (HOSPITAL_COMMUNITY): Payer: Self-pay | Admitting: Pharmacy Technician

## 2013-01-10 ENCOUNTER — Telehealth: Payer: Self-pay | Admitting: Family Medicine

## 2013-01-10 LAB — CULTURE, BLOOD (ROUTINE X 2): Culture: NO GROWTH

## 2013-01-10 NOTE — Telephone Encounter (Signed)
Patient calls wanting info about amputation surgery. Please advise [phone] or [phone]

## 2013-01-10 NOTE — Telephone Encounter (Signed)
Called and spoke with patient's sister.  States he was d/c'd from hospital.  Thinks Dr. Lianne Moris office is referring patient for surgery.  Is due to have toe(s) amputated due to "low circulation."  Sister wants to speak with Dr. Raeford Razor and has questions about transportation, amputation, post-op wound care.  States patient has hospital follow-up appt tomorrow with Dr. Raeford Razor and will plan to attend to discuss patient's surgery.  Nolene Ebbs, RN

## 2013-01-10 NOTE — Discharge Summary (Signed)
Family Medicine Teaching Service  Discharge Note : Attending Jeff Walden MD Pager [phone] Inpatient Team Pager:  [phone]  I have reviewed this patient and the patient's chart and have discussed discharge planning with the resident at the time of discharge. I agree with the discharge plan as above.    

## 2013-01-11 ENCOUNTER — Encounter: Payer: Self-pay | Admitting: Family Medicine

## 2013-01-11 ENCOUNTER — Ambulatory Visit (INDEPENDENT_AMBULATORY_CARE_PROVIDER_SITE_OTHER): Payer: Medicaid Other | Admitting: Family Medicine

## 2013-01-11 VITALS — BP 166/78 | HR 76 | Temp 99.3°F | Ht 68.0 in | Wt 216.0 lb

## 2013-01-11 DIAGNOSIS — E119 Type 2 diabetes mellitus without complications: Secondary | ICD-10-CM

## 2013-01-11 DIAGNOSIS — I96 Gangrene, not elsewhere classified: Secondary | ICD-10-CM

## 2013-01-11 DIAGNOSIS — I10 Essential (primary) hypertension: Secondary | ICD-10-CM

## 2013-01-11 NOTE — Assessment & Plan Note (Signed)
Unable to titrate medications based on his compliance.  - HgA1c was trending up upon admission.  - Lantus 50U and Novolog 8 U with meals  - all oral medications were re-started on discharge  - told to schedule appt with Dr. Valentina Lucks in diabetes clinic  - expressed the importance of blood sugar testing and bringing back measurements.

## 2013-01-11 NOTE — Progress Notes (Signed)
    Subjective:     Patient ID: Leonard [name], male   DOB: 1951-01-09, 62 y.o.   MRN: [id number]  HPI  Leonard [name] is a 63 yo M with PMH of T2DM, HTN, diabetic foot infection here for hospital follow up.   He was admitted for gangrene of his right fifth digit.  He is not cleaning his ulcer site. He is just wearing socks and no bandage covering the site.  He did not know when his appointment with Leonard [name] was.  They had several questions regarding his vascular appointment, in terms of post wound care, long term prognosis and any need for post op equipment. They have concerns about transportation to his various appointments.   HTN: Elevated today during exam. He is currently on amlodipine, clonidine, lisinopril, maxzide, and atenolol. He was found to have hyperkalemia (5.9) on admission.  His was given calcium, insulin, and kayexalate. He currently does not check his blood pressure on a regular basis.  His blood pressure was within normal limits when he was discharged on December 5. Currently states that he is compliant with his medications. States that he has no side effects.   T2DM: His most recent HgA1c 11.7.  He is currently on Lantus 50 U. His blood sugar was regularly elevated during admission.  He is still not checking his blood sugars.    Review of Systems All other systems reviewed and otherwise normal.      Objective:   Physical Exam BP 166/78  Pulse 76  Temp(Src) 99.3 F (37.4 C) (Oral)  Ht 5\' 8"  (1.727 m)  Wt 216 lb (97.977 kg)  BMI 32.85 kg/m2 Gen: NAD, alert, cooperative with exam CV: RRR, good S1/S2, no murmur Resp: CTABL, no wheezes, non-labored Ext: Right foot with eschar and stage 4 ulcer of dorsal 5th digit; Serous drainage from site; Slightly warm to palpation; smell noticed when examining  DP pulse was diminished;  thickened callous with some drainage for right first MTP joint on plantar aspect;  Left foot with callous numerous pre-ulcerative calluses, no nerve  deficits to light touch. Left fifth digit tender to palpation and could be beginning to be gangrenous as well. Neuro: Alert and oriented     Assessment:         Plan:

## 2013-01-11 NOTE — Assessment & Plan Note (Signed)
Call Dr. Lianne Moris office for patient and informed him of his appt on Thursday.  - a 4x4 was placed on his right fifth digit.  - does not appear to be infected today.  - serous drainage noted on sock

## 2013-01-11 NOTE — Patient Instructions (Signed)
Thank you for coming in,   Please check your blood sugars three times a day.  Make sure you record the measurements and bring them to me next time so I will know how to adjust your medications.   Also please try to check blood pressure at least once a week and bring me the measurements so I will know how to change your medications in the future.   Please follow up with Dr. Bridgett Larsson as scheduled.   I would like you to schedule a follow up with Dr. Valentina Lucks in our diabetes clinic. You will need to check in the front desk for his phone number and make an appointment.   Try to soak your foot in soap and water three times a day. Avoid putting a sock on your foot while you are at home.    Please feel free to call with any questions or concerns at any time, at [phone]. --Dr. Valora Corporal Diet The [employer] stands for "Dietary Approaches to Stop Hypertension." It is a healthy eating plan that has been shown to reduce high blood pressure (hypertension) in as little as 14 days, while also possibly providing other significant health benefits. These other health benefits include reducing the risk of breast cancer after menopause and reducing the risk of type 2 diabetes, heart disease, colon cancer, and stroke. Health benefits also include weight loss and slowing kidney failure in patients with chronic kidney disease.  DIET GUIDELINES  Limit salt (sodium). Your diet should contain less than 1500 mg of sodium daily.  Limit refined or processed carbohydrates. Your diet should include mostly whole grains. Desserts and added sugars should be used sparingly.  Include small amounts of heart-healthy fats. These types of fats include nuts, oils, and tub margarine. Limit saturated and trans fats. These fats have been shown to be harmful in the body. CHOOSING FOODS  The following food groups are based on a 2000 calorie diet. See your Registered Dietitian for individual calorie needs. Grains and Grain Products (6 to 8  servings daily)  Eat More Often: Whole-wheat bread, brown rice, whole-grain or wheat pasta, quinoa, popcorn without added fat or salt (air popped).  Eat Less Often: White bread, white pasta, white rice, cornbread. Vegetables (4 to 5 servings daily)  Eat More Often: Fresh, frozen, and canned vegetables. Vegetables may be raw, steamed, roasted, or grilled with a minimal amount of fat.  Eat Less Often/Avoid: Creamed or fried vegetables. Vegetables in a cheese sauce. Fruit (4 to 5 servings daily)  Eat More Often: All fresh, canned (in natural juice), or frozen fruits. Dried fruits without added sugar. One hundred percent fruit juice ( cup [237 mL] daily).  Eat Less Often: Dried fruits with added sugar. Canned fruit in light or heavy syrup. [employer], Fish, and Poultry (2 servings or less daily. One serving is 3 to 4 oz [85-114 g]).  Eat More Often: Ninety percent or leaner ground beef, tenderloin, sirloin. Round cuts of beef, chicken breast, [place] breast. All fish. Grill, bake, or broil your meat. Nothing should be fried.  Eat Less Often/Avoid: Fatty cuts of meat, [place], or chicken leg, thigh, or wing. Fried cuts of meat or fish. Dairy (2 to 3 servings)  Eat More Often: Low-fat or fat-free milk, low-fat plain or light yogurt, reduced-fat or part-skim cheese.  Eat Less Often/Avoid: Milk (whole, 2%).Whole milk yogurt. Full-fat cheeses. Nuts, Seeds, and Legumes (4 to 5 servings per week)  Eat More Often: All without added salt.  Eat Less Often/Avoid: Salted nuts and seeds, canned beans with added salt. Fats and Sweets (limited)  Eat More Often: Vegetable oils, tub margarines without trans fats, sugar-free gelatin. Mayonnaise and salad dressings.  Eat Less Often/Avoid: Coconut oils, palm oils, butter, stick margarine, cream, half and half, cookies, candy, pie. FOR MORE INFORMATION The Dash Diet Eating Plan: www.dashdiet.org Document Released: 01/09/2011 Document Revised: 04/14/2011  Document Reviewed: 01/09/2011 [facility] Patient Information 2014 [place], [place].

## 2013-01-11 NOTE — Assessment & Plan Note (Signed)
Still question his compliance due to normal BP on discharge (12/5) but elevated today.  - unable to titrate his medications based on this.  - follow up in two months  - will need frequent monitoring with hyperkalemia on presentation to hospital

## 2013-01-12 ENCOUNTER — Encounter (HOSPITAL_COMMUNITY): Payer: Self-pay | Admitting: Pharmacy Technician

## 2013-01-12 ENCOUNTER — Other Ambulatory Visit: Payer: Self-pay

## 2013-01-13 ENCOUNTER — Encounter (HOSPITAL_COMMUNITY)
Admission: RE | Disposition: A | Discharge status: Home / Self Care | Payer: Self-pay | Source: Ambulatory Visit | Attending: Vascular Surgery

## 2013-01-13 ENCOUNTER — Encounter (HOSPITAL_COMMUNITY): Payer: Self-pay | Admitting: General Practice

## 2013-01-13 ENCOUNTER — Telehealth: Payer: Self-pay | Admitting: Vascular Surgery

## 2013-01-13 ENCOUNTER — Observation Stay (HOSPITAL_COMMUNITY)
Admission: RE | Admit: 2013-01-13 | Discharge: 2013-01-14 | Disposition: A | Discharge status: Home / Self Care | Payer: Medicaid Other | Source: Ambulatory Visit | Attending: Vascular Surgery | Admitting: Vascular Surgery

## 2013-01-13 DIAGNOSIS — I70269 Atherosclerosis of native arteries of extremities with gangrene, unspecified extremity: Principal | ICD-10-CM | POA: Insufficient documentation

## 2013-01-13 DIAGNOSIS — I739 Peripheral vascular disease, unspecified: Secondary | ICD-10-CM

## 2013-01-13 DIAGNOSIS — I708 Atherosclerosis of other arteries: Secondary | ICD-10-CM | POA: Insufficient documentation

## 2013-01-13 DIAGNOSIS — I10 Essential (primary) hypertension: Secondary | ICD-10-CM | POA: Insufficient documentation

## 2013-01-13 DIAGNOSIS — I96 Gangrene, not elsewhere classified: Secondary | ICD-10-CM | POA: Diagnosis present

## 2013-01-13 DIAGNOSIS — Z7902 Long term (current) use of antithrombotics/antiplatelets: Secondary | ICD-10-CM | POA: Insufficient documentation

## 2013-01-13 DIAGNOSIS — Z794 Long term (current) use of insulin: Secondary | ICD-10-CM | POA: Insufficient documentation

## 2013-01-13 DIAGNOSIS — E119 Type 2 diabetes mellitus without complications: Secondary | ICD-10-CM | POA: Insufficient documentation

## 2013-01-13 DIAGNOSIS — F17200 Nicotine dependence, unspecified, uncomplicated: Secondary | ICD-10-CM | POA: Insufficient documentation

## 2013-01-13 DIAGNOSIS — L97509 Non-pressure chronic ulcer of other part of unspecified foot with unspecified severity: Secondary | ICD-10-CM | POA: Insufficient documentation

## 2013-01-13 HISTORY — PX: ANGIOPLASTY / STENTING FEMORAL: SUR30

## 2013-01-13 HISTORY — DX: Peripheral vascular disease, unspecified: I73.9

## 2013-01-13 HISTORY — DX: Type 2 diabetes mellitus without complications: E11.9

## 2013-01-13 HISTORY — PX: LOWER EXTREMITY ANGIOGRAM: SHX5508

## 2013-01-13 HISTORY — DX: Gangrene, not elsewhere classified: I96

## 2013-01-13 LAB — GLUCOSE, CAPILLARY
Glucose-Capillary: 218 mg/dL — ABNORMAL HIGH (ref 70–99)
Glucose-Capillary: 337 mg/dL — ABNORMAL HIGH (ref 70–99)
Glucose-Capillary: 328 mg/dL — ABNORMAL HIGH (ref 70–99)

## 2013-01-13 LAB — CREATININE, SERUM
Creatinine, Ser: 1.02 mg/dL (ref 0.50–1.35)
GFR calc Af Amer: 90 mL/min — ABNORMAL LOW (ref >90)
GFR calc non Af Amer: 77 mL/min — ABNORMAL LOW (ref >90)

## 2013-01-13 LAB — POCT I-STAT, CHEM 8
BUN: 30 mg/dL — ABNORMAL HIGH (ref 6–23)
Creatinine, Ser: 1.5 mg/dL — ABNORMAL HIGH (ref 0.50–1.35)
HCT: 29 % — ABNORMAL LOW (ref 39.0–52.0)
Hemoglobin: 9.9 g/dL — ABNORMAL LOW (ref 13.0–17.0)
Potassium: 4.5 mEq/L (ref 3.5–5.1)
Sodium: 137 mEq/L (ref 135–145)
TCO2: 22 mmol/L (ref 0–100)

## 2013-01-13 LAB — CBC
HCT: 30.5 % — ABNORMAL LOW (ref 39.0–52.0)
Hemoglobin: 9.8 g/dL — ABNORMAL LOW (ref 13.0–17.0)
MCHC: 32.1 g/dL (ref 30.0–36.0)
MCV: 84.5 fL (ref 78.0–100.0)
RBC: 3.61 MIL/uL — ABNORMAL LOW (ref 4.22–5.81)
RDW: 13.3 % (ref 11.5–15.5)
WBC: 5.7 10*3/uL (ref 4.0–10.5)

## 2013-01-13 LAB — POCT ACTIVATED CLOTTING TIME
Activated Clotting Time: 188 seconds
Activated Clotting Time: 193 seconds
Activated Clotting Time: 205 seconds
Activated Clotting Time: 166 seconds
Activated Clotting Time: 188 seconds

## 2013-01-13 LAB — HEMOGLOBIN A1C: Hgb A1c MFr Bld: 11.9 % — ABNORMAL HIGH (ref <5.7)

## 2013-01-13 SURGERY — ANGIOGRAM, LOWER EXTREMITY
Anesthesia: LOCAL | Laterality: Right

## 2013-01-13 MED ORDER — MORPHINE SULFATE 2 MG/ML IJ SOLN: 2.0000 mg | INTRAMUSCULAR | Status: DC | PRN

## 2013-01-13 MED ORDER — INSULIN GLARGINE 100 UNIT/ML ~~LOC~~ SOLN
50.0000 [IU] | Freq: Every day | SUBCUTANEOUS | Status: DC
  Administered 2013-01-13: 50 [IU] via SUBCUTANEOUS
  Filled 2013-01-13 (×3): qty 0.5

## 2013-01-13 MED ORDER — LISINOPRIL 40 MG PO TABS
40.0000 mg | ORAL_TABLET | Freq: Every day | ORAL | Status: DC
  Administered 2013-01-14: 40 mg via ORAL
  Filled 2013-01-13: qty 1

## 2013-01-13 MED ORDER — HEPARIN SODIUM (PORCINE) 1000 UNIT/ML IJ SOLN
INTRAMUSCULAR | Status: AC
  Filled 2013-01-13: qty 1

## 2013-01-13 MED ORDER — INSULIN ASPART 100 UNIT/ML ~~LOC~~ SOLN
0.0000 [IU] | Freq: Three times a day (TID) | SUBCUTANEOUS | Status: DC
  Administered 2013-01-13: 20:00:00 11 [IU] via SUBCUTANEOUS
  Administered 2013-01-14: 09:00:00 3 [IU] via SUBCUTANEOUS
  Filled 2013-01-13: qty 0.15

## 2013-01-13 MED ORDER — HEPARIN (PORCINE) IN NACL 2-0.9 UNIT/ML-% IJ SOLN
INTRAMUSCULAR | Status: AC
  Filled 2013-01-13: qty 1000

## 2013-01-13 MED ORDER — LIDOCAINE HCL (PF) 1 % IJ SOLN
INTRAMUSCULAR | Status: AC
  Filled 2013-01-13: qty 30

## 2013-01-13 MED ORDER — OXYCODONE HCL 5 MG PO TABS
5.0000 mg | ORAL_TABLET | ORAL | Status: DC | PRN
  Administered 2013-01-13: 5 mg via ORAL
  Filled 2013-01-13: qty 1

## 2013-01-13 MED ORDER — SODIUM CHLORIDE 0.9 % IV SOLN: 1.0000 mL/kg/h | INTRAVENOUS | Status: DC

## 2013-01-13 MED ORDER — CLONIDINE HCL 0.2 MG PO TABS
0.2000 mg | ORAL_TABLET | Freq: Two times a day (BID) | ORAL | Status: DC
  Administered 2013-01-13 – 2013-01-14 (×2): 0.2 mg via ORAL
  Filled 2013-01-13 (×4): qty 1

## 2013-01-13 MED ORDER — MORPHINE SULFATE 10 MG/ML IJ SOLN: 2.0000 mg | INTRAMUSCULAR | Status: DC | PRN

## 2013-01-13 MED ORDER — MIDAZOLAM HCL 2 MG/2ML IJ SOLN
INTRAMUSCULAR | Status: AC
  Filled 2013-01-13: qty 2

## 2013-01-13 MED ORDER — ONDANSETRON HCL 4 MG/2ML IJ SOLN: 4.0000 mg | Freq: Four times a day (QID) | INTRAMUSCULAR | Status: DC | PRN

## 2013-01-13 MED ORDER — CLOPIDOGREL BISULFATE 75 MG PO TABS
300.0000 mg | ORAL_TABLET | Freq: Once | ORAL | Status: AC
  Administered 2013-01-14: 11:00:00 300 mg via ORAL
  Filled 2013-01-13: qty 4

## 2013-01-13 MED ORDER — ENOXAPARIN SODIUM 40 MG/0.4ML ~~LOC~~ SOLN
40.0000 mg | SUBCUTANEOUS | Status: DC
  Filled 2013-01-13 (×2): qty 0.4

## 2013-01-13 MED ORDER — ATENOLOL 100 MG PO TABS
100.0000 mg | ORAL_TABLET | Freq: Every day | ORAL | Status: DC
  Administered 2013-01-14: 09:00:00 100 mg via ORAL
  Filled 2013-01-13: qty 1

## 2013-01-13 MED ORDER — SODIUM CHLORIDE 0.9 % IV SOLN: INTRAVENOUS | Status: DC

## 2013-01-13 MED ORDER — AMLODIPINE BESYLATE 10 MG PO TABS
10.0000 mg | ORAL_TABLET | Freq: Every day | ORAL | Status: DC
  Administered 2013-01-14: 10:00:00 10 mg via ORAL
  Filled 2013-01-13: qty 1

## 2013-01-13 MED ORDER — FENTANYL CITRATE 0.05 MG/ML IJ SOLN
INTRAMUSCULAR | Status: AC
  Filled 2013-01-13: qty 2

## 2013-01-13 MED ORDER — ACETAMINOPHEN 325 MG PO TABS: 650.0000 mg | ORAL_TABLET | ORAL | Status: DC | PRN

## 2013-01-13 MED ORDER — OXYCODONE-ACETAMINOPHEN 5-325 MG PO TABS: 1.0000 | ORAL_TABLET | ORAL | Status: DC | PRN

## 2013-01-13 MED ORDER — VERAPAMIL HCL 2.5 MG/ML IV SOLN
INTRAVENOUS | Status: AC
  Filled 2013-01-13: qty 2

## 2013-01-13 MED ORDER — SIMVASTATIN 5 MG PO TABS
5.0000 mg | ORAL_TABLET | Freq: Every day | ORAL | Status: DC
  Administered 2013-01-13: 20:00:00 5 mg via ORAL
  Filled 2013-01-13 (×2): qty 1

## 2013-01-13 MED ORDER — TRIAMTERENE-HCTZ 75-50 MG PO TABS
1.0000 | ORAL_TABLET | Freq: Every day | ORAL | Status: DC
  Administered 2013-01-13 – 2013-01-14 (×2): 1 via ORAL
  Filled 2013-01-13 (×2): qty 1

## 2013-01-13 MED ORDER — NITROGLYCERIN IN D5W 200-5 MCG/ML-% IV SOLN
INTRAVENOUS | Status: AC
  Filled 2013-01-13: qty 250

## 2013-01-13 MED ORDER — GLIPIZIDE 10 MG PO TABS
10.0000 mg | ORAL_TABLET | Freq: Two times a day (BID) | ORAL | Status: DC
  Administered 2013-01-13 – 2013-01-14 (×2): 10 mg via ORAL
  Filled 2013-01-13 (×4): qty 1

## 2013-01-13 MED ORDER — HEPARIN (PORCINE) IN NACL 2-0.9 UNIT/ML-% IJ SOLN
INTRAMUSCULAR | Status: AC
  Filled 2013-01-13: qty 500

## 2013-01-13 MED ORDER — SODIUM CHLORIDE 0.9 % IV SOLN
INTRAVENOUS | Status: DC
  Administered 2013-01-13: 20:00:00 via INTRAVENOUS

## 2013-01-13 MED ORDER — CLOPIDOGREL BISULFATE 75 MG PO TABS: 75.0000 mg | ORAL_TABLET | Freq: Every day | ORAL | Status: DC

## 2013-01-13 MED ORDER — CLOPIDOGREL BISULFATE 75 MG PO TABS
75.0000 mg | ORAL_TABLET | Freq: Every day | ORAL | Status: DC
  Filled 2013-01-13: qty 1

## 2013-01-13 MED ORDER — INSULIN GLARGINE 100 UNITS/ML SOLOSTAR PEN: 50.0000 [IU] | PEN_INJECTOR | Freq: Every day | SUBCUTANEOUS | Status: DC

## 2013-01-13 NOTE — Progress Notes (Signed)
BP 195/90.  BP meds and Percocet given for back pain, BP down to 99/60.  Pt dozing, awakens easily, denies complaints.  Tele SR 60's.  Left groin level 1, bruised but soft.

## 2013-01-13 NOTE — Op Note (Addendum)
OPERATIVE NOTE   PROCEDURE: 1.  Left common femoral artery cannulation under ultrasound guidance 2.  Placement of catheter in aorta 3.  Third order selection 4.  Orbital atherectomy of Right superficial femoral artery x 2 5.  Angioplasty of Right superficial femoral artery x 2 (3 mm x 60 mm, 4 mm x 60 mm) 6.  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  POST-OPERATIVE DIAGNOSIS: same as above   SURGEON: Adele Barthel, MD  ANESTHESIA: conscious sedation  ESTIMATED BLOOD LOSS: 50 cc  CONTRAST: 150 cc  FINDING(S):  Right Left  CIA Patent with calcified ledge evident with difficulty traversing the CIA, aneurysmal Patent, aneurysmal  EIA Patent Patent  IIA Occluded Patent with calcified orifice  CFA Patent   SFA Patent but two serial stenoses 75-90% in distal superficial femoral artery: 4 mm lumen after atherectomy and angioplasty   PFA Patent   Pop Patent   Trif Patent   AT Patent, proximal patent with likely occlusion in proximal segment, extensive collaterals quickly reconstitute around this occlusion, distal lumen stenoses >90% with lumen 1.0-1.5 mm   Pero Occluded   PT Occluded    SPECIMEN(S):  none  INDICATIONS:   Leonard [name] is a 62 y.o. male who presents with left 5th toe dry gangrene.  The patient presents for: right leg angiogram, possible orbital atherectomy and angioplasty of superficial femoral artery and anterior tibial artery.  I discussed with the patient the nature of angiographic procedures, especially the limited patencies of any endovascular intervention.  The patient is aware of that the risks of an angiographic procedure include but are not limited to: bleeding, infection, access site complications, renal failure, embolization, rupture of vessel, dissection, possible need for emergent surgical intervention, possible need for surgical procedures to treat the patient's pathology, and stroke and  death.  The patient is aware of the risks and agrees to proceed.  DESCRIPTION: After full informed consent was obtained from the patient, the patient was brought back to the angiography suite.  The patient was placed supine upon the angiography table and connected to monitoring equipment.  The patient was then given conscious sedation, the amounts of which are documented in the patient's chart.  The patient was prepped and drape in the standard fashion for an angiographic procedure.  At this point, attention was turned to the left groin.  Under ultrasound guidance, the left common femoral artery was cannulated with a micropuncture needle.  The microwire was advanced into the iliac arterial system.  The needle was exchanged for a microsheath, which was loaded into the common femoral artery over the wire.  The microwire was exchanged for a [facility] wire which was advanced into the aorta.  The microsheath was then exchanged for a 6-Fr Destination sheath which was loaded into the common iliac artery.  The Omniflush catheter was then loaded over the wire up to the level of aortic bifurcation.  The [facility] wire was replaced in the catheter, and using the [place] and Omniflush catheter, the right common iliac artery was selected.  Neither wire or sheath would advance, so the wire was exchanged for a Glidewire which was advanced into the right common femoral artery.  The catheter still would not advance so it was exchanged for an end-hole catheter.  Unfortunately, this combination was kicked out the right iliac system.  At this point, numerous clot issues began developing with the left femoral sheath.  A Benson wire was  replaced and the sheath exchanged for a short 6-Fr sheath.  I replaced the Omniflush to the level of the aortic bifurcation.  Using the [place] and Glidewire, I selected the right common iliac artery.  The catheter was exchanged for the end-hole catheter.  This time I was able to get through the right  common iliac artery, though a calcified ledge was evident in the artery.  Eventually I got the catheter into the superficial femoral artery.  Hand injections identified the distal superficial femoral artery segment with serial stenoses 75-90%.  Using a 0.014" Spartacore and Quickcross catheter, I was able to navigate into the at-the knee segment of the popliteal artery.  The wire was exchanged for the Viper wire.  A 2.0 Diamondback Stealth device was selected based on vessel size.  I loaded the device over the wire and used it twice at 60KRPM, 90KRPM, and 140KRPM to treat the two stenoses.  I gave 100 mg of Nitroglycerin after each change in revolution speed for a total of 300 mg of Nitroglycerin.  Completion imaging demonstrated improved luminal gain.  Based on measurements, I obtained a 3 mm x 60 mm angioplasty balloon.  I centered the balloon on these two stenoses and inflated the balloon to 12 atm for 2 minutes.  There was no waist evident.  I deflated the balloon and exchanged it for a 4 mm x 60 mm balloon which was inflated to 8 atm for 2 minutes.  There was minimal waist evident which resolved.  Completion imaging demonstrated a 4 mm patent lumen in the segment of prior near occlusion.    At this point, I completed the right leg runoff in stations to verify no embolization.  I then tried to select the right anterior tibial artery with the Spartacore and Cook CX catheter.  Unfortunately, the wire preferentially selected two collaterals proximal to the anterior tibial artery and I was never able to select the anterior tibial artery.  Completion angiogram demonstrated no complications from the procedure.  The wire and catheter were removed.  A Benson wire was loaded in the sheath and the sheath was backup to the left external iliac artery.  I then exchanged the sheath for a short 6-Fr sheath.  The sheath was aspirated.  No clots were present and the sheath was reloaded with heparinized saline.    Dr.  Kellie Simmering will review the films and determine if any further intervention is needed.  COMPLICATIONS: none  CONDITION: stable  Adele Barthel, MD [facility] [place] Office: [phone] Pager: [phone]  01/13/2013, 12:20 PM

## 2013-01-13 NOTE — Telephone Encounter (Addendum)
Message copied by Gena Fray on Thu Jan 13, 2013  2:55 PM ------      Message from: Alfonso Patten      Created: Thu Jan 13, 2013  2:22 PM       Dr Bridgett Larsson said 2 weeks then sent this so I combined them      ----- Message -----         From: Leonard Green, MD         Sent: 01/13/2013   1:03 PM           To: Alfonso Patten, RN            [name]      [id number]      29-Apr-1950            Pt to follow up with Dr. Kellie Simmering not myself       ------  01/13/13: lm for pt, dpm

## 2013-01-13 NOTE — H&P (View-Only) (Signed)
[facility] [place] NOTE   MRN : [id number]  Reason for Consult: right foot wound Referring Physician: Lupita Dawn, MD   History of Present Illness: 62 y.o. Male with right foot wound progressively getting worse over the past few months.   Past medical history include hypertension, hyperlipidemia and DM.    He was seeing podiatrist Harriet Masson on 11/16/12, who debrided tissue, applied silvadene dressing and gave prescription for Keflex. The patient was supposed to follow up in 2-3 weeks but never did. He came into the ED today because his neighbor told him to. In the ED at [facility] there was concern for infection with gangrene, and the patient was also found to have hyperkalemia (6.0). Therefore, he was admitted for further care. Currently, he denies pain in that area. He denies fever or chills.  Since his admission, he has been ordered to start on Zosyn and Vancomycin.  He has had ABI's and his results are below.  He is  on Atenolol, Norvasc, and insulin for DM.  He is also on Pravachol for his hyperlipidemia.     Current Facility-Administered Medications  Medication Dose Route Frequency Provider Last Rate Last Dose  . amLODipine (NORVASC) tablet 10 mg  10 mg Oral Daily Angelica Ran, MD   10 mg at 01/05/13 0959  . aspirin EC tablet 81 mg  81 mg Oral Daily Angelica Ran, MD   81 mg at 01/05/13 0959  . atenolol (TENORMIN) tablet 100 mg  100 mg Oral Daily Angelica Ran, MD   100 mg at 01/05/13 0959  . cloNIDine (CATAPRES) tablet 0.2 mg  0.2 mg Oral BID Angelica Ran, MD   0.2 mg at 01/05/13 0959  . enoxaparin (LOVENOX) injection 40 mg  40 mg Subcutaneous Q24H Angelica Ran, MD   40 mg at 01/04/13 2307  . HYDROcodone-acetaminophen (NORCO/VICODIN) 5-325 MG per tablet 1-2 tablet  1-2 tablet Oral Q4H PRN Angelica Ran, MD      . insulin aspart (novoLOG) injection 0-15 Units  0-15 Units Subcutaneous TID WC Angelica Ran,  MD   5 Units at 01/05/13 0805  . insulin aspart (novoLOG) injection 4 Units  4 Units Subcutaneous TID WC Angelica Ran, MD   4 Units at 01/05/13 [phone]  . insulin glargine (LANTUS) injection 40 Units  40 Units Subcutaneous QHS Langston Masker, MD      . piperacillin-tazobactam (ZOSYN) IVPB 3.375 g  3.375 g Intravenous Q8H Dareen Piano, RPH   3.375 g at 01/05/13 0805  . simvastatin (ZOCOR) tablet 20 mg  20 mg Oral q1800 Angelica Ran, MD      . sodium chloride 0.9 % injection 3 mL  3 mL Intravenous Q12H Angelica Ran, MD      . vancomycin (VANCOCIN) IVPB 1000 mg/200 mL premix  1,000 mg Intravenous Q12H Dareen Piano, [facility]        Pt meds include: Statin :Yes Betablocker: Yes ASA: No Other anticoagulants/antiplatelets: no  Past Medical History  Diagnosis Date  . Diabetes mellitus   . Hypertension     Past Surgical History  Procedure Laterality Date  . Throat surgery  1983    polyps    Social History History  Substance Use Topics  . Smoking status: Current Every Day Smoker -- 1.00 packs/day for 44 years    Types: Cigarettes  . Smokeless tobacco: Never Used  . Alcohol Use: No    Family History  Family History  Problem Relation Age of Onset  . Cancer Mother     lung cancer  . Hypertension Mother   . Cancer Sister     patient thinks it was uterine cancer  . Hypertension Sister     No Known Allergies   REVIEW OF SYSTEMS  General: [ ]  Weight loss, [ ]  Fever, [ ]  chills Neurologic: [ ]  Dizziness, [ ]  Blackouts, [ ]  Seizure [ ]  Stroke, [ ]  "Mini stroke", [ ]  Slurred speech, [ ]  Temporary blindness; [ ]  weakness in arms or legs, [ ]  Hoarseness [ ]  Dysphagia Cardiac: [ ]  Chest pain/pressure, [ ]  Shortness of breath at rest [ ]  Shortness of breath with exertion, [ ]  Atrial fibrillation or irregular heartbeat  Vascular: [ ]  Pain in legs with walking, [ ]  Pain in legs at rest, [ ]  Pain in legs at night,  [ ]  Non-healing ulcer, [ ]  Blood clot in  vein/DVT,   Pulmonary: [ ]  Home oxygen, [ ]  Productive cough, [ ]  Coughing up blood, [ ]  Asthma,  [ ]  Wheezing [ ]  COPD Musculoskeletal:  [ ]  Arthritis, [ ]  Low back pain, [ ]  Joint pain Hematologic: [ ]  Easy Bruising, [ ]  Anemia; [ ]  Hepatitis Gastrointestinal: [ ]  Blood in stool, [ ]  Gastroesophageal Reflux/heartburn, Urinary: [ ]  chronic Kidney disease, [ ]  on HD - [ ]  MWF or [ ]  TTHS, [ ]  Burning with urination, [ ]  Difficulty urinating Skin: [ ]  Rashes, [ ]  Wounds Psychological: [ ]  Anxiety, [ ]  Depression  Physical Examination Filed Vitals:   01/04/13 1414 01/04/13 1827 01/04/13 2057 01/05/13 0603  BP: 179/79 152/76 158/74 139/67  Pulse: 72 74 76 71  Temp: 98.1 F (36.7 C) 99 F (37.2 C) 98.3 F (36.8 C) 98.5 F (36.9 C)  TempSrc: Oral Oral Oral Oral  Resp: 20 18 20 20   Height:   5\' 8"  (1.727 m)   Weight:   205 lb 1.6 oz (93.033 kg)   SpO2: 99% 97% 96% 98%   Body mass index is 31.19 kg/(m^2).  General:  WDWN in NAD Gait: Normal HENT: WNL Eyes: Pupils equal Pulmonary: normal non-labored breathing , without Rales, rhonchi,  wheezing Cardiac: RRR, without  Murmurs, rubs or gallops; No carotid bruits Abdomen: soft, NT, no masses  Vascular Exam/Pulses:Right 5th toe Black/ dry gangrene.  Lateral 5th MT head wound.  Plantar right 1st MTP joint callus with central wound.  No active drainage.   Skin warm to touch. Doppler DP right.     Musculoskeletal: no muscle wasting or atrophy; no edema  Neurologic: A&O X 3; Appropriate Affect ;  SENSATION: normal; MOTOR FUNCTION: 5/5 Symmetric Speech is fluent/normal   Significant Diagnostic Studies: CBC Lab Results  Component Value Date   WBC 5.7 01/05/2013   HGB 10.2* 01/05/2013   HCT 30.3* 01/05/2013   MCV 82.8 01/05/2013   PLT 170 01/05/2013    BMET    Component Value Date/Time   NA 139 01/05/2013 0535   K 4.7 01/05/2013 0535   CL 106 01/05/2013 0535   CO2 21 01/05/2013 0535   GLUCOSE 270* 01/05/2013 0535   BUN 25*  01/05/2013 0535   CREATININE 1.12 01/05/2013 0535   CREATININE 0.98 11/25/2012 1546   CALCIUM 9.2 01/05/2013 0535   GFRNONAA 69* 01/05/2013 0535   GFRAA 80* 01/05/2013 0535   Estimated Creatinine Clearance: 76.6 ml/min (by C-G formula based on Cr of 1.12).  COAG No results found for this basename:  INR, PROTIME     Non-Invasive Vascular Imaging:  ABI's 01/05/13 as follows:  RIGHT    LEFT     PRESSURE  WAVEFORM   PRESSURE  WAVEFORM   BRACHIAL  147  Tri  BRACHIAL  136  Tri   DP    DP     AT  127  Damp mono  AT  79  Damp mono   PT  120  mono  PT  100  mono   PER    PER     GREAT TOE   NA  GREAT TOE   NA     RIGHT  LEFT   ABI  0.86  0.68      ASSESSMENT/PLAN:  DM, smoker, HTN and hypercholesterolemia. Right foot 5th toe black/dry gangrene 5th metatarsal head lateral wound Plantar 1st MTP joint callus with wound Continue the antibiotics. We will schedule him for angiogram with run off to determine his arterial flow is adequate for revascularization and/or to heal a 5th digit/metatarsal ray amputation.       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 Will need extensive lateral foot amputation which is doubtful to heal  Plan angiogram the next few days to see if revascularization or percutaneous intervention is feasible

## 2013-01-13 NOTE — Interval H&P Note (Signed)
[facility] [place]  History and Physical Update  The patient was interviewed and re-examined.  The patient's previous History and Physical has been reviewed and is unchanged from Dr. Evelena Leyden consult except for: interval diagnostic angiogram.  Based on the images, patient needs an attempt at right leg runoff, possible orbital atherectomy and angioplasty of right superficial femoral artery and anterior tibial artery.  I discussed with the patient the nature of angiographic procedures, especially the limited patencies of any endovascular intervention.  The patient is aware of that the risks of an angiographic procedure include but are not limited to: bleeding, infection, access site complications, renal failure, embolization, rupture of vessel, dissection, possible need for emergent surgical intervention, possible need for surgical procedures to treat the patient's pathology, anaphylactic reaction to contrast, and stroke and death.  The patient is aware of the risks and agrees to proceed.  Adele Barthel, MD [facility] [place] Office: [phone] Pager: [phone]  01/13/2013, 9:39 AM

## 2013-01-14 LAB — GLUCOSE, CAPILLARY: Glucose-Capillary: 191 mg/dL — ABNORMAL HIGH (ref 70–99)

## 2013-01-14 LAB — BASIC METABOLIC PANEL
BUN: 22 mg/dL (ref 6–23)
CO2: 25 mEq/L (ref 19–32)
GFR calc non Af Amer: 75 mL/min — ABNORMAL LOW (ref >90)
Glucose, Bld: 221 mg/dL — ABNORMAL HIGH (ref 70–99)
Potassium: 4.5 mEq/L (ref 3.5–5.1)
Sodium: 140 mEq/L (ref 135–145)

## 2013-01-14 NOTE — Progress Notes (Signed)
Received diabetes coordinator consult.  Spoke with patient before his discharge.  Patient was diagnosed with diabetes about 13 years ago.  HgbA1C is 11.9% on 01/13/13.  Takes Lantus 50 units every night and takes Glucotrol 10 mg twice a day and Metformin 1000 mg twice a day.  Can get medication financially.  Has been seen by Dr. Clearance Coots in the [facility].  States that he has a foot doctor, too.  States that he has a home blood glucose meter at home, but does not have strips for it.  Suggested that he call the Family Medicine clinic today or Monday to make an appointment for follow up and  see diabetes educator in the clinic. Ask about the glucose meter that is covered by Medicaid.  Gave patient a meal planning brochure and Living with diabetes booklet to take home.  Suggested checking his feet every night before bed for any changes, blisters, sores, etc. and report to physician.  Harvel Ricks RN BSN CDE

## 2013-01-17 NOTE — Discharge Summary (Signed)
[facility] Discharge Summary   Patient ID:  Leonard [name] MRN: [id number] DOB/AGE: March 04, 1950 62 y.o.  Admit date: 01/13/2013 Discharge date: 01/17/2013 Date of Surgery: 01/13/2013 Surgeon: Surgeon(s): Conrad Magn[place], MD  Admission Diagnosis: PVD  Discharge Diagnoses:  PVD  Secondary Diagnoses: Past Medical History  Diagnosis Date  . Hypertension   . PAD (peripheral artery disease)   . Type II diabetes mellitus   . Gangrene of toe     right 5th/notes 01/04/2013     Procedure(s): LOWER EXTREMITY ANGIOGRAM  Discharged Condition: good  HPI: The patient was interviewed and re-examined. The patient's previous History and Physical has been reviewed and is unchanged from Dr. Evelena Leyden consult except for: interval diagnostic angiogram. Based on the images, patient needs an attempt at right leg runoff, possible orbital atherectomy and angioplasty of right superficial femoral artery and anterior tibial artery. I discussed with the patient the nature of angiographic procedures, especially the limited patencies of any endovascular intervention. The patient is aware of that the risks of an angiographic procedure include but are not limited to: bleeding, infection, access site complications, renal failure, embolization, rupture of vessel, dissection, possible need for emergent surgical intervention, possible need for surgical procedures to treat the patient's pathology, anaphylactic reaction to contrast, and stroke and death. The patient is aware of the risks and agrees to proceed.    PROCEDURE: 01/13/2013 1. Left common femoral artery cannulation under ultrasound guidance  2. Placement of catheter in aorta  3. Third order selection  4. Orbital atherectomy of Right superficial femoral artery x 2  5. Angioplasty of Right superficial femoral artery x 2 (3 mm x 60 mm, 4 mm x 60 mm)  6. Intraarterial administration of nitroglycerin x 3        Hospital Course:   Leonard ELIZARDO is a 62 y.o. male is S/P Right Procedure(s): LOWER EXTREMITY ANGIOGRAM Extubated: POD # 0 Physical exam: PROCEDURE:  1. Left common femoral artery cannulation under ultrasound guidance  2. Placement of catheter in aorta  3. Third order selection  4. Orbital atherectomy of Right superficial femoral artery x 2  5. Angioplasty of Right superficial femoral artery x 2 (3 mm x 60 mm, 4 mm x 60 mm)  6. Intraarterial administration of nitroglycerin x 3  Post-op wounds healing well Pt. Ambulating, voiding and taking PO diet without difficulty. Pt pain controlled with PO pain meds. Labs as below Complications:none  Consults:     Significant Diagnostic Studies: CBC Lab Results  Component Value Date   WBC 5.7 01/13/2013   HGB 9.8* 01/13/2013   HCT 30.5* 01/13/2013   MCV 84.5 01/13/2013   PLT 172 01/13/2013    BMET    Component Value Date/Time   NA 140 01/14/2013 0520   K 4.5 01/14/2013 0520   CL 106 01/14/2013 0520   CO2 25 01/14/2013 0520   GLUCOSE 221* 01/14/2013 0520   BUN 22 01/14/2013 0520   CREATININE 1.05 01/14/2013 0520   CREATININE 0.98 11/25/2012 1546   CALCIUM 9.2 01/14/2013 0520   GFRNONAA 75* 01/14/2013 0520   GFRAA 87* 01/14/2013 0520   COAG No results found for this basename: INR, PROTIME     Disposition:  Discharge to :Home Discharge Orders   Future Appointments Provider Department Dept Phone   02/01/2013 3:00 PM Mal Misty, MD [facility] [phone]   Future Orders Complete By Expires   Call MD for:  redness, tenderness, or signs of infection (  pain, swelling, bleeding, redness, odor or green/yellow discharge around incision site)  As directed    Call MD for:  severe or increased pain, loss or decreased feeling  in affected limb(s)  As directed    Call MD for:  temperature >100.5  As directed    Discharge instructions  As directed    Comments:     Wash right foot 2 times per day with soap and  water.  Wear cushioned protective shoes.   Driving Restrictions  As directed    Comments:     No driving for 24 hours   Lifting restrictions  As directed    Comments:     No lifting for 6 weeks   Resume previous diet  As directed        Medication List         amLODipine 10 MG tablet  Commonly known as:  NORVASC  Take 1 tablet (10 mg total) by mouth daily.     atenolol 100 MG tablet  Commonly known as:  TENORMIN  Take 100 mg by mouth daily.     cloNIDine 0.2 MG tablet  Commonly known as:  CATAPRES  Take 1 tablet (0.2 mg total) by mouth 2 (two) times daily.     clopidogrel 75 MG tablet  Commonly known as:  PLAVIX  Take 1 tablet (75 mg total) by mouth daily.     glipiZIDE 10 MG tablet  Commonly known as:  GLUCOTROL  Take 1 tablet (10 mg total) by mouth 2 (two) times daily before a meal.     insulin glargine 100 units/mL Soln  Commonly known as:  LANTUS  Inject 50 Units into the skin at bedtime.     lisinopril 40 MG tablet  Commonly known as:  PRINIVIL,ZESTRIL  Take 1 tablet (40 mg total) by mouth daily.     metFORMIN 1000 MG tablet  Commonly known as:  GLUCOPHAGE  Take 1 tablet (1,000 mg total) by mouth 2 (two) times daily with a meal.     pravastatin 40 MG tablet  Commonly known as:  PRAVACHOL  Take 1 tablet (40 mg total) by mouth daily.     triamterene-hydrochlorothiazide 75-50 MG per tablet  Commonly known as:  MAXZIDE  Take 1 tablet by mouth daily.       Verbal and written Discharge instructions given to the patient. Wound care per Discharge AVS   Signed: Laurence Slate [facility] 01/17/2013, 8:27 AM  Addendum  I have independently interviewed and examined the patient, and I agree with the physician assistant's discharge summary.  This patient underwent a R SFA OA+PTA for R 5th toe gangrene.  He was admitted overnight due to lack of supervision at home overnight per hospital policy.  He was discharged the next AM without any complications.  Adele Barthel,  MD [facility] [place] Office: [phone] Pager: [phone]  01/17/2013, 9:23 AM

## 2013-01-20 ENCOUNTER — Telehealth: Payer: Self-pay | Admitting: Family Medicine

## 2013-01-20 NOTE — Telephone Encounter (Signed)
Leonard [name] with [facility] called: She had visit today. Pt is not checking his blood sugar number. He wants a meter that is "simpler" Needs lancets for current machine

## 2013-01-24 ENCOUNTER — Telehealth: Payer: Self-pay | Admitting: Family Medicine

## 2013-01-24 NOTE — Telephone Encounter (Signed)
Leonard [name] from [facility] calls to request a home health nurse to go a change patient dressing on his foot. Patient is not doing this the way he is suppose to, per family. Also, patient is a smoker and is now interested in quitting. Would like to try Nicotine patches.

## 2013-01-25 ENCOUNTER — Encounter: Payer: Self-pay | Admitting: Family Medicine

## 2013-01-25 ENCOUNTER — Inpatient Hospital Stay (HOSPITAL_COMMUNITY): Payer: Medicaid Other

## 2013-01-25 ENCOUNTER — Ambulatory Visit (INDEPENDENT_AMBULATORY_CARE_PROVIDER_SITE_OTHER): Payer: Medicaid Other | Admitting: Family Medicine

## 2013-01-25 ENCOUNTER — Inpatient Hospital Stay (HOSPITAL_COMMUNITY)
Admission: AD | Admit: 2013-01-25 | Discharge: 2013-02-01 | DRG: 256 | Disposition: A | Discharge status: Home Health | Payer: Medicaid Other | Source: Ambulatory Visit | Attending: Family Medicine | Admitting: Family Medicine

## 2013-01-25 VITALS — BP 133/70 | HR 80 | Temp 98.1°F | Ht 68.0 in | Wt 207.0 lb

## 2013-01-25 DIAGNOSIS — E1159 Type 2 diabetes mellitus with other circulatory complications: Principal | ICD-10-CM | POA: Diagnosis present

## 2013-01-25 DIAGNOSIS — Z91199 Patient's noncompliance with other medical treatment and regimen due to unspecified reason: Secondary | ICD-10-CM

## 2013-01-25 DIAGNOSIS — E1169 Type 2 diabetes mellitus with other specified complication: Secondary | ICD-10-CM

## 2013-01-25 DIAGNOSIS — E11628 Type 2 diabetes mellitus with other skin complications: Secondary | ICD-10-CM

## 2013-01-25 DIAGNOSIS — L089 Local infection of the skin and subcutaneous tissue, unspecified: Secondary | ICD-10-CM

## 2013-01-25 DIAGNOSIS — N179 Acute kidney failure, unspecified: Secondary | ICD-10-CM | POA: Diagnosis present

## 2013-01-25 DIAGNOSIS — I739 Peripheral vascular disease, unspecified: Secondary | ICD-10-CM | POA: Diagnosis present

## 2013-01-25 DIAGNOSIS — I1A0 Resistant hypertension: Secondary | ICD-10-CM | POA: Diagnosis present

## 2013-01-25 DIAGNOSIS — E11621 Type 2 diabetes mellitus with foot ulcer: Secondary | ICD-10-CM | POA: Diagnosis present

## 2013-01-25 DIAGNOSIS — D649 Anemia, unspecified: Secondary | ICD-10-CM | POA: Diagnosis present

## 2013-01-25 DIAGNOSIS — Z9119 Patient's noncompliance with other medical treatment and regimen: Secondary | ICD-10-CM

## 2013-01-25 DIAGNOSIS — E1129 Type 2 diabetes mellitus with other diabetic kidney complication: Secondary | ICD-10-CM | POA: Diagnosis present

## 2013-01-25 DIAGNOSIS — F17200 Nicotine dependence, unspecified, uncomplicated: Secondary | ICD-10-CM | POA: Diagnosis present

## 2013-01-25 DIAGNOSIS — I10 Essential (primary) hypertension: Secondary | ICD-10-CM

## 2013-01-25 DIAGNOSIS — E785 Hyperlipidemia, unspecified: Secondary | ICD-10-CM

## 2013-01-25 DIAGNOSIS — L97509 Non-pressure chronic ulcer of other part of unspecified foot with unspecified severity: Secondary | ICD-10-CM | POA: Diagnosis present

## 2013-01-25 DIAGNOSIS — I96 Gangrene, not elsewhere classified: Secondary | ICD-10-CM | POA: Diagnosis present

## 2013-01-25 DIAGNOSIS — E119 Type 2 diabetes mellitus without complications: Secondary | ICD-10-CM

## 2013-01-25 DIAGNOSIS — D509 Iron deficiency anemia, unspecified: Secondary | ICD-10-CM | POA: Diagnosis present

## 2013-01-25 DIAGNOSIS — M869 Osteomyelitis, unspecified: Secondary | ICD-10-CM | POA: Diagnosis present

## 2013-01-25 DIAGNOSIS — E782 Mixed hyperlipidemia: Secondary | ICD-10-CM | POA: Diagnosis present

## 2013-01-25 HISTORY — DX: Pure hypercholesterolemia, unspecified: E78.00

## 2013-01-25 LAB — CBC WITH DIFFERENTIAL/PLATELET
Basophils Absolute: 0 10*3/uL (ref 0.0–0.1)
Eosinophils Absolute: 0.1 10*3/uL (ref 0.0–0.7)
Eosinophils Relative: 2 % (ref 0–5)
HCT: 27.2 % — ABNORMAL LOW (ref 39.0–52.0)
Lymphocytes Relative: 14 % (ref 12–46)
Lymphs Abs: 1.2 10*3/uL (ref 0.7–4.0)
MCH: 26.9 pg (ref 26.0–34.0)
MCV: 81.4 fL (ref 78.0–100.0)
Monocytes Absolute: 1.1 10*3/uL — ABNORMAL HIGH (ref 0.1–1.0)
Monocytes Relative: 14 % — ABNORMAL HIGH (ref 3–12)
Neutro Abs: 5.8 10*3/uL (ref 1.7–7.7)
RBC: 3.34 MIL/uL — ABNORMAL LOW (ref 4.22–5.81)
RDW: 13.7 % (ref 11.5–15.5)
WBC: 8.3 10*3/uL (ref 4.0–10.5)

## 2013-01-25 LAB — COMPREHENSIVE METABOLIC PANEL
ALT: 10 U/L (ref 0–53)
AST: 11 U/L (ref 0–37)
BUN: 45 mg/dL — ABNORMAL HIGH (ref 6–23)
CO2: 20 mEq/L (ref 19–32)
Calcium: 9.5 mg/dL (ref 8.4–10.5)
Chloride: 99 mEq/L (ref 96–112)
Creatinine, Ser: 1.59 mg/dL — ABNORMAL HIGH (ref 0.50–1.35)
GFR calc Af Amer: 52 mL/min — ABNORMAL LOW (ref >90)
GFR calc non Af Amer: 45 mL/min — ABNORMAL LOW (ref >90)
Glucose, Bld: 231 mg/dL — ABNORMAL HIGH (ref 70–99)
Sodium: 133 mEq/L — ABNORMAL LOW (ref 135–145)
Total Bilirubin: 0.2 mg/dL — ABNORMAL LOW (ref 0.3–1.2)

## 2013-01-25 LAB — GLUCOSE, CAPILLARY
Glucose-Capillary: 251 mg/dL — ABNORMAL HIGH (ref 70–99)
Glucose-Capillary: 160 mg/dL — ABNORMAL HIGH (ref 70–99)

## 2013-01-25 LAB — SEDIMENTATION RATE: Sed Rate: 112 mm/hr — ABNORMAL HIGH (ref 0–16)

## 2013-01-25 MED ORDER — ACETAMINOPHEN 325 MG PO TABS
650.0000 mg | ORAL_TABLET | Freq: Four times a day (QID) | ORAL | Status: DC | PRN
  Administered 2013-01-25 – 2013-02-01 (×4): 650 mg via ORAL
  Filled 2013-01-25 (×4): qty 2

## 2013-01-25 MED ORDER — PIPERACILLIN-TAZOBACTAM 3.375 G IVPB
3.3750 g | Freq: Three times a day (TID) | INTRAVENOUS | Status: DC
  Administered 2013-01-25 – 2013-01-31 (×18): 3.375 g via INTRAVENOUS
  Filled 2013-01-25 (×20): qty 50

## 2013-01-25 MED ORDER — ACETAMINOPHEN 650 MG RE SUPP: 650.0000 mg | Freq: Four times a day (QID) | RECTAL | Status: DC | PRN

## 2013-01-25 MED ORDER — INSULIN GLARGINE 100 UNIT/ML ~~LOC~~ SOLN
25.0000 [IU] | Freq: Every day | SUBCUTANEOUS | Status: DC
  Filled 2013-01-25: qty 0.25

## 2013-01-25 MED ORDER — CLONIDINE HCL 0.2 MG PO TABS
0.2000 mg | ORAL_TABLET | Freq: Two times a day (BID) | ORAL | Status: DC
  Administered 2013-01-26 – 2013-01-31 (×12): 0.2 mg via ORAL
  Filled 2013-01-25 (×19): qty 1

## 2013-01-25 MED ORDER — INSULIN ASPART 100 UNIT/ML ~~LOC~~ SOLN
0.0000 [IU] | Freq: Three times a day (TID) | SUBCUTANEOUS | Status: DC
  Administered 2013-01-25: 5 [IU] via SUBCUTANEOUS
  Administered 2013-01-26: 2 [IU] via SUBCUTANEOUS
  Administered 2013-01-26: 5 [IU] via SUBCUTANEOUS
  Administered 2013-01-27: 1 [IU] via SUBCUTANEOUS
  Administered 2013-01-27: 3 [IU] via SUBCUTANEOUS
  Administered 2013-01-27: 5 [IU] via SUBCUTANEOUS
  Administered 2013-01-28 (×2): 3 [IU] via SUBCUTANEOUS
  Administered 2013-01-28: 7 [IU] via SUBCUTANEOUS
  Administered 2013-01-29 (×3): 5 [IU] via SUBCUTANEOUS

## 2013-01-25 MED ORDER — VANCOMYCIN HCL 10 G IV SOLR
1250.0000 mg | Freq: Two times a day (BID) | INTRAVENOUS | Status: DC
  Administered 2013-01-25 – 2013-01-26 (×2): 1250 mg via INTRAVENOUS
  Filled 2013-01-25 (×2): qty 1250

## 2013-01-25 MED ORDER — ATENOLOL 100 MG PO TABS
100.0000 mg | ORAL_TABLET | Freq: Every day | ORAL | Status: DC
  Administered 2013-01-26 – 2013-01-31 (×6): 100 mg via ORAL
  Filled 2013-01-25 (×7): qty 1

## 2013-01-25 MED ORDER — SIMVASTATIN 20 MG PO TABS
20.0000 mg | ORAL_TABLET | Freq: Every day | ORAL | Status: DC
  Administered 2013-01-26 – 2013-01-31 (×6): 20 mg via ORAL
  Filled 2013-01-25 (×7): qty 1

## 2013-01-25 MED ORDER — SODIUM CHLORIDE 0.9 % IV SOLN
INTRAVENOUS | Status: DC
  Administered 2013-01-25 – 2013-01-28 (×10): via INTRAVENOUS
  Administered 2013-01-29: 20 mL/h via INTRAVENOUS

## 2013-01-25 MED ORDER — OXYCODONE HCL 5 MG PO TABS
5.0000 mg | ORAL_TABLET | Freq: Four times a day (QID) | ORAL | Status: DC | PRN
  Administered 2013-01-26 – 2013-01-29 (×5): 5 mg via ORAL
  Filled 2013-01-25 (×5): qty 1

## 2013-01-25 MED ORDER — AMLODIPINE BESYLATE 10 MG PO TABS
10.0000 mg | ORAL_TABLET | Freq: Every day | ORAL | Status: DC
  Administered 2013-01-26 – 2013-01-31 (×6): 10 mg via ORAL
  Filled 2013-01-25 (×7): qty 1

## 2013-01-25 MED ORDER — LISINOPRIL 40 MG PO TABS
40.0000 mg | ORAL_TABLET | Freq: Every day | ORAL | Status: DC
  Administered 2013-01-26 – 2013-01-31 (×6): 40 mg via ORAL
  Filled 2013-01-25 (×7): qty 1

## 2013-01-25 MED ORDER — IBUPROFEN 400 MG PO TABS
400.0000 mg | ORAL_TABLET | Freq: Four times a day (QID) | ORAL | Status: DC | PRN
  Filled 2013-01-25: qty 1

## 2013-01-25 MED ORDER — HEPARIN SODIUM (PORCINE) 5000 UNIT/ML IJ SOLN
5000.0000 [IU] | Freq: Three times a day (TID) | INTRAMUSCULAR | Status: DC
  Administered 2013-01-25 – 2013-01-27 (×7): 5000 [IU] via SUBCUTANEOUS
  Filled 2013-01-25 (×10): qty 1

## 2013-01-25 MED ORDER — GADOBENATE DIMEGLUMINE 529 MG/ML IV SOLN
20.0000 mL | Freq: Once | INTRAVENOUS | Status: AC
  Administered 2013-01-25: 17 mL via INTRAVENOUS

## 2013-01-25 NOTE — Progress Notes (Signed)
Family Medicine Teaching Service Service Pager: [phone]  Leonard [name] 63 y.o. male  MRN: [id number]  DOB: 1950-05-10   Primary Care Provider:   Rosemarie Ax, MD  Consultants:  vascular surgery    BRIEF PATIENT OVERVIEW   LOS: 0 days  62 y.o. year old male seen and examined once on the floor; a direct admission from [facility] family medicine clinic.  Briefly patient has known profound peripheral arterial disease and known lower extremity ulcerations with presentation to clinic today with acute worsening of a fifth gangrenous foot with surrounding erythema.  Questionable rigors and fevers at home without objective temperature.  Currently afebrile.  Denies chest pain, shortness of breath, abdominal pain, melena, hematochezia.  Denies significant change in functional status.   BP 110/49  Pulse 75  Temp(Src) 99 F (37.2 C) (Oral)  Resp 19  Ht 5\' 8"  (1.727 m)  Wt 184 lb 3.2 oz (83.553 kg)  BMI 28.01 kg/m2  SpO2 98%  Right fifth toe is necrotic with surrounding gangrenou changes including erythema and drainage.  There is a Wagner grade 2 ulceration with associated callus over the plantar aspect of the first MCP. Capillary refill is greater than 5 seconds, peripheral pulses difficult to palpate.  Significant lower extremity pallor RRR, S1/S2 heard, no murmur CTA B, no wheezes, no crackles +BS, soft, non-tender, no rigidity, no guarding, no masses/hepatosplenomegaly   Brief plan: 1. Obtain blood cultures, starting vancomycin and Zosyn. 2. MRI to evaluate for further osteomyelitis and soft tissue involvement. 3. vascular surgery consulted and will see in the morning.  Previous plan for revascularization prior to amputation however given worsening this will likely need to be moved up and consideration for more proximal amputation may be warranted. 4. Chronic problems per H&P  Gerda Diss, DO [name] Resident - PGY-3 01/25/2013 10:39 PM

## 2013-01-25 NOTE — Consult Note (Signed)
ANTIBIOTIC CONSULT NOTE - INITIAL  Pharmacy Consult for Vancomycin and Zosyn Indication: diabetic foot ulcer  No Known Allergies  Patient Measurements: Height: 5\' 8"  (172.7 cm) (Simultaneous filing. User may not have seen previous data.) Weight: 184 lb 3.2 oz (83.553 kg) (Simultaneous filing. User may not have seen previous data.) IBW/kg (Calculated) : 68.4   Vital Signs: Temp: 99 F (37.2 C) (12/23 1626) Temp src: Oral (12/23 1626) BP: 110/49 mmHg (12/23 1626) Pulse Rate: 75 (12/23 1626) Intake/Output from previous day:   Intake/Output from this shift:    Labs: No results found for this basename: WBC, HGB, PLT, LABCREA, CREATININE,  in the last 72 hours Estimated Creatinine Clearance: 77.9 ml/min (by C-G formula based on Cr of 1.05).  Microbiology: Recent Results (from the past 720 hour(s))  CULTURE, BLOOD (ROUTINE X 2)     Status: None   Collection Time    01/04/13  3:50 PM      Result Value Range Status   Specimen Description BLOOD LEFT ARM   Final   Special Requests BOTTLES DRAWN AEROBIC AND ANAEROBIC 5CC   Final   Culture  Setup Time     Final   Value: 01/04/2013 20:30     Performed at [facility]   Culture     Final   Value: NO GROWTH 5 DAYS     Performed at [facility]   Report Status 01/10/2013 FINAL   Final  CULTURE, BLOOD (ROUTINE X 2)     Status: None   Collection Time    01/04/13  3:55 PM      Result Value Range Status   Specimen Description BLOOD RIGHT ANTECUBITAL   Final   Special Requests BOTTLES DRAWN AEROBIC AND ANAEROBIC 5CC   Final   Culture  Setup Time     Final   Value: 01/04/2013 20:31     Performed at [facility]   Culture     Final   Value: NO GROWTH 5 DAYS     Performed at [facility]   Report Status 01/10/2013 FINAL   Final  MRSA PCR SCREENING     Status: None   Collection Time    01/05/13 12:50 AM      Result Value Range Status   MRSA by PCR NEGATIVE  NEGATIVE Final   Comment:            The  GeneXpert MRSA Assay (FDA     approved for NASAL specimens     only), is one component of a     comprehensive MRSA colonization     surveillance program. It is not     intended to diagnose MRSA     infection nor to guide or     monitor treatment for     MRSA infections.    Medical History: Past Medical History  Diagnosis Date  . Hypertension   . PAD (peripheral artery disease)   . Type II diabetes mellitus   . Gangrene of toe     right 5th/notes 01/04/2013    Assessment: 62yom with hx PAD s/p vascular surgery on 12/11 presents with increased draining, odor, and warmth on his right foot wound. He will begin empiric antibiotics for diabetic right foot ulcer. Renal function is stable with sCr 1 and CrCl ~ 75ml/min.  Goal of Therapy:  Vancomycin trough level 10-15 mcg/ml  Plan:  1) Vancomycin 1250mg  IV q12 2) Zosyn 3.375g IV q8 (4 hour infusion) 3) Follow renal  function, cultures, LOT, trough at steady state  Deboraha Sprang 01/25/2013,4:36 PM

## 2013-01-25 NOTE — H&P (Signed)
I have seen and examined this patient. I have discussed with Dr Raeford Razor.  I agree with their findings and plans as documented in his admission note.  Principle Diagnoses 1. Right Diabetic foot ulcer with possible skin and soft tissue infection - Possible component of wet gangrene right lateral forefoot.  2. Dry Gangrene of right foot 5th digit 3. Distal right foot peripheral arterial disease of anterior tibial artery and peroneal artery  Agree with broad spectrum antibiotics Right foot MRI to look for deep tissue/bone infections. Consult Vascular Surgery (Dr Adele Barthel)

## 2013-01-25 NOTE — Progress Notes (Signed)
     Subjective:     Patient ID: Leonard [name], male   DOB: 10-08-1950, 62 y.o.   MRN: [id number]  HPI See H&P dated for 12/23  Review of Systems     Objective:   Physical Exam     Assessment:         Plan:

## 2013-01-25 NOTE — H&P (Signed)
[facility] Admission History and Physical Service Pager: [phone]  [name] Medical record number: [id number] Date of birth: 07-12-1950 Age: 62 y.o. Gender: male  Primary Care Provider: Rosemarie Ax, MD Consultants: Vascular Surgery  Code Status: Full   Chief Complaint: Draining from right fifth digit    Assessment and Plan: Leonard [name] is a 62 y.o. male presenting with increased draining, odor, warmth from his fifth digit on his right foot wound.  PMH is significant for uncontrolled DM, HTN, PAD, and dry gangrene of right fifth digit.    #Diabetic foot ulcer of right fifth digit- Possible for soft tissue infection or infection of the bone. Displaying signs of tenderness on exam to palpation with warmth and erythema. Due to the holiday season and history of poor compliance as well as having follow up a week from now, decided to have the patient admitted in order to have antibiotics.   - Start Vanc for MRSA coverage and Zosyn for pseudomonal coverage  - MRI  - Obtain wound consult  - Consult vascular surgery  - CBC w/ diff   #PAD: s/p left femoral artery cannulation, placement of catheter in aorta, third order selection, orbital atherectomy or right superficial femoral artery, intraarterial administration of nitroglycerin on 12/11. - Tolerated the procedure, ambulated well.  - F/u scheduled for 12/30 at 3:00pm with Dr. Kellie Simmering   # Diabetes - A1C 11.9 on 01/13/13 and pt with history of non adherence  - Hold home glipizide and metformin  - Decrease home Lantus to 25 - Sensitive sliding scale (NPO at the moment if surgery indicated)     # HTN - history of high BP (190s) when non adherent  - Cont amlodipine, atenolol, and clonidine, lisinopril, triamterene/HCTZ  - CMP (given previous admission with Hyperkalemia)   # HLD  - Cont statin   # Tobacco Abuse  - would like options for quitting   #Social: family would like an  in home aid to come help deliver his medications and monitoring of his chronic diseases  - consider c/s to care management for possible home health   FEN/GI: NPO, NS 125 mL/hr   Prophylaxis: Lovenox SQ daily  Disposition: admitted to the   History of Present Illness: Leonard [name] is a 62 y.o. male presenting with increased drainage from his right fifth digit.  The drainage has been increasing since his procedures on 12/11. Was advised to soak his foot with soap and water at least twice a day and wear his protective footwear. He has not done either of these things. He has not applied any dressing to his ulcer on his right foot. He has been walking to the store down his street and has noticed increased swelling and pain after this. He denies any fevers but endorses having chills.  He denies any loss of feeling in his foot.    Patient was recently admitted 12/2 with gangrene of his fifth digit.  Previous ABI were ABI obtained with findings of 0.86right/0.68 left. 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  Vasc felt that pt could be further w/up as outpt including orbital atherectomy and angioplasty of R superficial fem artery and anterior tib artery.   Review Of Systems: Per HPI with the following additions: denies shortness of breath, chest pain, loss of feeling, nauseas, vomiting.  Otherwise 12 point review of systems was performed and was unremarkable.  Patient Active Problem List  Diagnosis Date Noted  . Gangrene of foot 01/13/2013  . Diabetic foot infection 01/04/2013  . Gangrene of toe 01/04/2013  . Essential hypertension, benign 11/25/2012  . Blurry vision, bilateral 11/25/2012  . Diabetes mellitus, type 2 01/06/2012  . MIXED HYPERLIPIDEMIA 07/17/2008  . TOBACCO ABUSE 07/17/2008  . HYPERTENSION, HEART UNCONTROLLED W/O ASSOC CHF 07/17/2008  . HYPERTENSION, HEART CONTROLLED W/O ASSOC CHF 07/17/2008   Past  Medical History: Past Medical History  Diagnosis Date  . Hypertension   . PAD (peripheral artery disease)   . Type II diabetes mellitus   . Gangrene of toe     right 5th/notes 01/04/2013    Past Surgical History: Past Surgical History  Procedure Laterality Date  . Throat surgery  1983    polyps  . Angioplasty / stenting femoral Right 01/13/2013    superficial femoral artery x 2 (3 mm x 60 mm, 4 mm x 60 mm)  . Tonsillectomy and adenoidectomy  ~ 1965   Social History: History  Substance Use Topics  . Smoking status: Current Every Day Smoker -- 1.00 packs/day for 44 years    Types: Cigarettes  . Smokeless tobacco: Never Used  . Alcohol Use: No   Additional social history: none Please also refer to relevant sections of EMR.  Family History: Family History  Problem Relation Age of Onset  . Cancer Mother     lung cancer  . Hypertension Mother   . Cancer Sister     patient thinks it was uterine cancer  . Hypertension Sister    Allergies and Medications: No Known Allergies Current Outpatient Prescriptions on File Prior to Visit  Medication Sig Dispense Refill  . amLODipine (NORVASC) 10 MG tablet Take 1 tablet (10 mg total) by mouth daily.  90 tablet  1  . atenolol (TENORMIN) 100 MG tablet Take 100 mg by mouth daily.      . cloNIDine (CATAPRES) 0.2 MG tablet Take 1 tablet (0.2 mg total) by mouth 2 (two) times daily.  90 tablet  1  . clopidogrel (PLAVIX) 75 MG tablet Take 1 tablet (75 mg total) by mouth daily.  30 tablet  11  . glipiZIDE (GLUCOTROL) 10 MG tablet Take 1 tablet (10 mg total) by mouth 2 (two) times daily before a meal.  90 tablet  1  . insulin glargine (LANTUS) 100 units/mL SOLN Inject 50 Units into the skin at bedtime.      Marland Kitchen lisinopril (PRINIVIL,ZESTRIL) 40 MG tablet Take 1 tablet (40 mg total) by mouth daily.  30 tablet  3  . metFORMIN (GLUCOPHAGE) 1000 MG tablet Take 1 tablet (1,000 mg total) by mouth 2 (two) times daily with a meal.  60 tablet  6  .  pravastatin (PRAVACHOL) 40 MG tablet Take 1 tablet (40 mg total) by mouth daily.  90 tablet  1  . triamterene-hydrochlorothiazide (MAXZIDE) 75-50 MG per tablet Take 1 tablet by mouth daily.       No current facility-administered medications on file prior to visit.    Objective: BP 133/70  Pulse 80  Temp(Src) 98.1 F (36.7 C) (Oral)  Ht 5\' 8"  (1.727 m)  Wt 207 lb (93.895 kg)  BMI 31.48 kg/m2 Exam: General: NAD, well nourished  HEENT: NCAT, PERRL,  Cardiovascular: S1S2, RRR, no murmurs, rubs or gallops  Respiratory: clear breath sounds bilaterally, no wheezes, crackles or rales  Abdomen: soft, non-tender, non-distended.  Extremities: bilateral DP could not be palpated.  Right foot: eschar of fifth digit,  stage 4 ulcer  wound of right lateral foot with warmth, erythema, and tenderness noted, serosanguineous, odor present,  Thickened callous of the right MTP on plantar aspect, no drainage or erythema  Left foot: deferred  Skin: aside from feet, no other rashes or ulcerations  Neuro: alert and oriented x 3, no focal deficits.     Rosemarie Ax, MD 01/25/2013, 3:09 PM PGY-1, [place] Intern pager: [phone], text pages welcome

## 2013-01-26 ENCOUNTER — Encounter (HOSPITAL_COMMUNITY): Payer: Self-pay | Admitting: General Practice

## 2013-01-26 DIAGNOSIS — N179 Acute kidney failure, unspecified: Secondary | ICD-10-CM | POA: Diagnosis present

## 2013-01-26 DIAGNOSIS — I70269 Atherosclerosis of native arteries of extremities with gangrene, unspecified extremity: Secondary | ICD-10-CM

## 2013-01-26 DIAGNOSIS — D509 Iron deficiency anemia, unspecified: Secondary | ICD-10-CM | POA: Diagnosis present

## 2013-01-26 LAB — GLUCOSE, CAPILLARY
Glucose-Capillary: 212 mg/dL — ABNORMAL HIGH (ref 70–99)
Glucose-Capillary: 271 mg/dL — ABNORMAL HIGH (ref 70–99)
Glucose-Capillary: 258 mg/dL — ABNORMAL HIGH (ref 70–99)

## 2013-01-26 MED ORDER — VANCOMYCIN HCL 10 G IV SOLR
1500.0000 mg | INTRAVENOUS | Status: DC
  Administered 2013-01-26: 1500 mg via INTRAVENOUS
  Filled 2013-01-26 (×2): qty 1500

## 2013-01-26 MED ORDER — NICOTINE 14 MG/24HR TD PT24
14.0000 mg | MEDICATED_PATCH | Freq: Every day | TRANSDERMAL | Status: DC
Start: 2013-01-26 — End: 2013-02-01
  Administered 2013-01-26 – 2013-02-01 (×7): 14 mg via TRANSDERMAL
  Filled 2013-01-26 (×7): qty 1

## 2013-01-26 NOTE — Assessment & Plan Note (Addendum)
Unclear etiology, chronic in nature but seems to be trending down Continue to trend, transfusion threshold of 8.0 given vascular surgery

## 2013-01-26 NOTE — Consult Note (Signed)
WOC wound consult note Reason for Consult: evaluation of neuropathic foot ulcer right foot. Seen by my partner earlier this month at which time patient with callous at the first metatarsal head. This is still present.  He does have gangrenous changes of the right fifth toe that vascular has examined this am.   Wound type: callous of the plantar surface, arterial ulceration of the 5th toe right foot Dressing procedure/placement/frequency: no topical care needed for callous, continue dry dressing to the fifth toe until further recommendations per vascular, appears may be having amputation later this week.  Follow up with routine foot care with vascular or podiatry for callous tx PRN.   Discussed POC with patient and bedside nurse.  Re consult if needed, will not follow at this time. Thanks  Celica Kotowski [place], [place] [phone])

## 2013-01-26 NOTE — Progress Notes (Signed)
Patient has fever 102.0 orally.Dr.Adamo notified new orders received.

## 2013-01-26 NOTE — Assessment & Plan Note (Addendum)
Followed by vascular surgery, plan right fifth toe amputation on Friday, December 26 Preop EKG pending, patient reports functional status is unchanged from baseline and does not have significant dyspnea on exertion with activity.  No known prior CAD (no prior ischemic workup) Continue vancomycin and Zosyn until after amputation. Continue to trend fever curve, avoid NSAIDs

## 2013-01-26 NOTE — Assessment & Plan Note (Signed)
Provided counseling today. - Nicotine patch while acutely hospitalized.

## 2013-01-26 NOTE — Assessment & Plan Note (Signed)
Creatinine is trending upward, avoid any further nephrotoxic agents. > Consider discontinuation of ARB/ACE

## 2013-01-26 NOTE — Assessment & Plan Note (Signed)
Wound care following

## 2013-01-26 NOTE — Assessment & Plan Note (Signed)
Well controlled , continue to monitor, no changes to home medications at this time

## 2013-01-26 NOTE — Progress Notes (Signed)
I have seen and examined this patient. I have discussed with Dr Arnette Schaumann.  I agree with their findings and plans as documented in their progress note.

## 2013-01-26 NOTE — Progress Notes (Signed)
Family Medicine Teaching Service Daily Progress Note Service Pager: [phone]  Leonard [name] 62 y.o. male  MRN: [id number]  DOB: Jan 15, 1951   LOS: 1 day  Full Code PCP: Rosemarie Ax, MD  CONSULTANTS: Vascular Surgery [facility])   PATIENT OVERVIEW  He was admitted on 01/25/2013 directly from clinic for Right 5th digit gangrene.  His PMHx is significant for poorly controlled diabetes, profound PND, markedly elevated hypertension, hyperlipidemia, tobacco abuse,  12/23 - Admitted for R 5th Gangrene; Started Vanco/Zosyn 12/24 - Plans for OR on Friday 12/26  --- DISPOSITION: Will need PT eval following surg for placement consideration  Subjective  Pt reports feeling good, no complaints.  Hungry.    febrile overnight but was unaware of this  Assessment & Plan  Gangrene of foot, right fifth metatarsal with osteomyelitis  Overview Followed by Vascular & Vein ([name]/Chen/Dickson) for diabetic wound. Falsely elevated ABI of 86%; multiple previous revascularization procedures on 01/13/13 due to right SFA and severe tibial artery disease  Assessment & Plan Followed by vascular surgery, plan right fifth toe amputation on Friday, December 26 Preop EKG pending, patient reports functional status is unchanged from baseline and does not have significant dyspnea on exertion with activity.  No known prior CAD (no prior ischemic workup) Continue vancomycin and Zosyn until after amputation. Continue to trend fever curve, avoid NSAIDs   TOBACCO ABUSE  Overview 1 ppd, contemplative  Assessment & Plan Provided counseling today. - Nicotine patch while acutely hospitalized.   Essential hypertension, benign  Assessment & Plan Well controlled , continue to monitor, no changes to home medications at this time   Mixed hyperlipidemia  Assessment & Plan Continue Pravachol   Diabetic ulcer of toe, R 1st plantar surface, viable  Assessment & Plan Wound care following   Acute  kidney injury  Assessment & Plan Creatinine is trending upward, avoid any further nephrotoxic agents. > Consider discontinuation of ARB/ACE   Normocytic anemia  Assessment & Plan Unclear etiology, chronic in nature but seems to be trending down Continue to trend, transfusion threshold of 8.0 given vascular surgery    Orders & Medications:  DIET: Carb Control IV:  . sodium chloride 125 mL/hr at 01/26/13 0513   Scheduled:   amLODipine 10 mg Oral Daily  atenolol 100 mg Oral Daily  cloNIDine 0.2 mg Oral BID  heparin 5,000 Units Subcutaneous Q8H  insulin aspart 0-9 Units Subcutaneous TID WC  lisinopril 40 mg Oral Daily  nicotine 14 mg Transdermal Daily  piperacillin-tazobactam (ZOSYN)  IV 3.375 g Intravenous Q8H  simvastatin 20 mg Oral q1800  vancomycin 1,500 mg Intravenous Q24H   PRN:   acetaminophen 650 mg Q6H PRN  Or    acetaminophen 650 mg Q6H PRN  oxyCODONE 5 mg Q6H PRN    OBJECTIVE  Vitals: Temp:  [98.1 F (36.7 C)-102 F (38.9 C)] 100.1 F (37.8 C) (12/24 0526) Pulse Rate:  [75-87] 84 (12/24 0500) Resp:  [18-19] 18 (12/24 0500) BP: (102-133)/(49-70) 120/49 mmHg (12/24 0500) SpO2:  [96 %-99 %] 99 % (12/24 0500) Weight:  [184 lb 3.2 oz (83.553 kg)-207 lb (93.895 kg)] 184 lb 3.2 oz (83.553 kg) (12/23 1626)  Weight: Baseline Weight: Weight Discrepancies between clinic and hospital: Wt Readings from Last 3 Encounters:  01/25/13 184 lb 3.2 oz (83.553 kg)  01/25/13 207 lb (93.895 kg)  01/13/13 213 lb (96.616 kg)    Filed Weights   01/25/13 1626  Weight: 184 lb 3.2 oz (83.553 kg)   I&Os:  Intake/Output  Summary (Last 24 hours) at 01/26/13 1113 Last data filed at 01/26/13 0900  Gross per 24 hour  Intake 2177.08 ml  Output    650 ml  Net 1527.08 ml    PHYSICAL EXAM: GENERAL:  adult obese, African American male. In no discomfort; no respiratory distress  PSYCH: alert and appropriate, good insight   HNEENT:   CARDIO: RRR, S1/S2 heard, no murmur   LUNGS: CTA B, no wheezes, no crackles  ABDOMEN:   EXTREM:  Warm, well perfused.  Moves all 4 extremities spontaneously; no lateralization.  Right foot with dressing in place, unable to appreciate DP or PT pulses on the right.  Capillary refill greater than 5 seconds.  Minimal pretibial edema.  GU:   SKIN:    IMAGING: 12/23 - MRI foot >> Osteo of 5th toe    LABS: Daily Others:   Recent Labs Lab 01/25/13 1700  WBC 8.3  HGB 9.0*  HCT 27.2*  PLT 177    Recent Labs Lab 01/25/13 1700  NA 133*  K 5.0  CL 99  CO2 20  BUN 45*  CREATININE 1.59*  GLUCOSE 231*  CALCIUM 9.5     01/25/2013 17:00  CRP 9.5 (H)  Sed Rate 112 (H)     MICRO: ABX: MICRO  12/23 - Vanco 12/24 - Zosyn 12/24 - Blood Cx X2   Gerda Diss, DO [name] Resident - PGY-3 01/26/2013 11:13 AM

## 2013-01-26 NOTE — Assessment & Plan Note (Signed)
-  Continue Pravachol ?

## 2013-01-26 NOTE — Consult Note (Addendum)
Vascular and Vein Specialist of [facility]  [name] MRN: [id number] DOB: [birth year] Sex: male  REASON FOR CONSULT: Worsening wound of the right fifth toe. Consult is from family medicine.  HPI: Leonard [name] is a 62 y.o. male who was seen in consultation by Dr. Kellie Simmering on 01/05/2013. He had a wound on the right fifth toe which had progressively been getting worse over the last few months. At that time, he had an ABI of 86% on the right but this was felt to be falsely elevated because of his diabetes. He did have a palpable popliteal pulse on exam.   He underwent an arteriogram by Dr. Bridgett Larsson on 01/13/2013. He was noted to have disease of his right superficial femoral artery and also severe tibial artery occlusive disease. On 01/13/2013 he underwent atherectomy of the right superficial femoral artery and angioplasty of the right superficial femoral artery. He was scheduled for a right fourth and fifth toe amputation on 02/01/2013. He was admitted yesterday by the family medicine service because of some cellulitis in the right foot and he is being treated with antibiotics. Vascular surgery was consult to determine if the toe needed to be amputated sooner.  Past Medical History  Diagnosis Date  . Hypertension   . PAD (peripheral artery disease)   . Type II diabetes mellitus   . Gangrene of toe     right 5th/notes 01/04/2013     Family History  Problem Relation Age of Onset  . Cancer Mother     lung cancer  . Hypertension Mother   . Cancer Sister     patient thinks it was uterine cancer  . Hypertension Sister     SOCIAL HISTORY: History  Substance Use Topics  . Smoking status: Current Every Day Smoker -- 1.00 packs/day for 44 years    Types: Cigarettes  . Smokeless tobacco: Never Used  . Alcohol Use: No    No Known Allergies  Current Facility-Administered Medications  Medication Dose Route Frequency Provider Last Rate Last Dose  . 0.9 %  sodium chloride  infusion   Intravenous Continuous Rosemarie Ax, MD 125 mL/hr at 01/26/13 [phone]    . acetaminophen (TYLENOL) tablet 650 mg  650 mg Oral Q6H PRN Rosemarie Ax, MD   650 mg at 01/25/13 2021   Or  . acetaminophen (TYLENOL) suppository 650 mg  650 mg Rectal Q6H PRN Rosemarie Ax, MD      . amLODipine (NORVASC) tablet 10 mg  10 mg Oral Daily Rosemarie Ax, MD      . atenolol (TENORMIN) tablet 100 mg  100 mg Oral Daily Rosemarie Ax, MD      . cloNIDine (CATAPRES) tablet 0.2 mg  0.2 mg Oral BID Rosemarie Ax, MD      . heparin injection 5,000 Units  5,000 Units Subcutaneous Q8H Rosemarie Ax, MD   5,000 Units at 01/26/13 [phone]  . ibuprofen (ADVIL,MOTRIN) tablet 400 mg  400 mg Oral Q6H PRN Beverlyn Roux, MD      . insulin aspart (novoLOG) injection 0-9 Units  0-9 Units Subcutaneous TID WC Rosemarie Ax, MD   5 Units at 01/25/13 1824  . lisinopril (PRINIVIL,ZESTRIL) tablet 40 mg  40 mg Oral Daily Rosemarie Ax, MD      . oxyCODONE (Oxy IR/ROXICODONE) immediate release tablet 5 mg  5 mg Oral Q6H PRN Beverlyn Roux, MD      . piperacillin-tazobactam (ZOSYN) IVPB 3.375 g  3.375 g Intravenous Q8H Benjamine Sprague [place], [place]   3.375 g at 01/26/13 0303  . simvastatin (ZOCOR) tablet 20 mg  20 mg Oral q1800 Rosemarie Ax, MD      . vancomycin (VANCOCIN) 1,250 mg in sodium chloride 0.9 % 250 mL IVPB  1,250 mg Intravenous Q12H Benjamine Sprague Byron, RPH   1,250 mg at 01/26/13 [id number]    REVIEW OF SYSTEMS: Valu.Nieves ] denotes positive finding; [  ] denotes negative finding CARDIOVASCULAR:  [ ]  chest pain   [ ]  chest pressure   [ ]  palpitations   [ ]  orthopnea   [ ]  dyspnea on exertion   [ ]  claudication   [ ]  rest pain   [ ]  DVT   [ ]  phlebitis PULMONARY:   [ ]  productive cough   [ ]  asthma   [ ]  wheezing NEUROLOGIC:   [ ]  weakness  [ ]  paresthesias  [ ]  aphasia  [ ]  amaurosis  [ ]  dizziness HEMATOLOGIC:   [ ]  bleeding problems   [ ]  clotting disorders MUSCULOSKELETAL:  [ ]  joint pain   [ ]  joint  swelling [ ]  leg swelling GASTROINTESTINAL: [ ]   blood in stool  [ ]   hematemesis GENITOURINARY:  [ ]   dysuria  [ ]   hematuria PSYCHIATRIC:  [ ]  history of major depression INTEGUMENTARY:  [ ]  rashes  [ ]  ulcers CONSTITUTIONAL:  [ ]  fever   [ ]  chills  PHYSICAL EXAM: Filed Vitals:   01/25/13 2300 01/26/13 0049 01/26/13 0500 01/26/13 0526  BP: 102/61  120/49   Pulse: 87  84   Temp: 102 F (38.9 C) 99.4 F (37.4 C) 100 F (37.8 C) 100.1 F (37.8 C)  TempSrc: Oral Oral Oral Oral  Resp: 18  18   Height:      Weight:      SpO2: 96%  99%    Body mass index is 28.01 kg/(m^2). GENERAL: The patient is a well-nourished male, in no acute distress. The vital signs are documented above. CARDIOVASCULAR: There is a regular rate and rhythm. I do not detect carotid bruits. He has a palpable femoral pulses and diminished popliteal pulse on the right. I cannot palpate pedal pulses on the right. PULMONARY: There is good air exchange bilaterally without wheezing or rales. ABDOMEN: Soft and non-tender with normal pitched bowel sounds.  MUSCULOSKELETAL: He has a gangrenous wound of his right fifth toe. NEUROLOGIC: No focal weakness or paresthesias are detected. SKIN: There are no ulcers or rashes noted. PSYCHIATRIC: The patient has a normal affect.  DATA:  Lab Results  Component Value Date   WBC 8.3 01/25/2013   HGB 9.0* 01/25/2013   HCT 27.2* 01/25/2013   MCV 81.4 01/25/2013   PLT 177 01/25/2013   Lab Results  Component Value Date   NA 133* 01/25/2013   K 5.0 01/25/2013   CL 99 01/25/2013   CO2 20 01/25/2013   Lab Results  Component Value Date   CREATININE 1.59* 01/25/2013   Lab Results  Component Value Date   HGBA1C 11.9* 01/13/2013   CBG (last 3)   Recent Labs  01/25/13 1653 01/25/13 2248  GLUCAP 251* 160*   His arteriogram shows severe tibial artery occlusive disease.  MEDICAL ISSUES: Patient presents with a gangrenous wound of his right fifth toe. He is scheduled for  toe amputation on 02/01/2013. He is currently on intravenous vancomycin and Zosyn. We could potentially proceed with right fifth toe amputation on Friday. Dr.  Kellie Simmering who originally saw the patient is on vacation and I will discuss the patient with Dr. Bridgett Larsson who has previously seen him.  [facility] [place] Beeper: [phone]

## 2013-01-27 ENCOUNTER — Inpatient Hospital Stay (HOSPITAL_COMMUNITY): Payer: Medicaid Other

## 2013-01-27 DIAGNOSIS — D649 Anemia, unspecified: Secondary | ICD-10-CM

## 2013-01-27 DIAGNOSIS — N179 Acute kidney failure, unspecified: Secondary | ICD-10-CM

## 2013-01-27 LAB — BASIC METABOLIC PANEL
BUN: 19 mg/dL (ref 6–23)
CO2: 20 mEq/L (ref 19–32)
Calcium: 8.9 mg/dL (ref 8.4–10.5)
GFR calc non Af Amer: 87 mL/min — ABNORMAL LOW (ref >90)
Glucose, Bld: 291 mg/dL — ABNORMAL HIGH (ref 70–99)
Potassium: 4 mEq/L (ref 3.5–5.1)
Sodium: 133 mEq/L — ABNORMAL LOW (ref 135–145)

## 2013-01-27 LAB — GLUCOSE, CAPILLARY
Glucose-Capillary: 350 mg/dL — ABNORMAL HIGH (ref 70–99)
Glucose-Capillary: 292 mg/dL — ABNORMAL HIGH (ref 70–99)

## 2013-01-27 MED ORDER — VANCOMYCIN HCL IN DEXTROSE 1-5 GM/200ML-% IV SOLN
1000.0000 mg | Freq: Two times a day (BID) | INTRAVENOUS | Status: DC
  Administered 2013-01-27 – 2013-01-30 (×7): 1000 mg via INTRAVENOUS
  Filled 2013-01-27 (×7): qty 200

## 2013-01-27 MED ORDER — DEXTROSE 5 % IV SOLN
1.5000 g | INTRAVENOUS | Status: AC
  Filled 2013-01-27: qty 1.5

## 2013-01-27 NOTE — Progress Notes (Signed)
   Preoperative Note   Procedure: R 5th toe ray amputation, possible 4th toe amputation  Date: 01/28/13  Preoperative diagnosis: Right foot gangrene  Consent: ordered, informed consent completed  Laboratory:  CBC:    Component Value Date/Time   WBC 8.3 01/25/2013 1700   RBC 3.34* 01/25/2013 1700   HGB 9.0* 01/25/2013 1700   HCT 27.2* 01/25/2013 1700   PLT 177 01/25/2013 1700   MCV 81.4 01/25/2013 1700   MCH 26.9 01/25/2013 1700   MCHC 33.1 01/25/2013 1700   RDW 13.7 01/25/2013 1700   LYMPHSABS 1.2 01/25/2013 1700   MONOABS 1.1* 01/25/2013 1700   EOSABS 0.1 01/25/2013 1700   BASOSABS 0.0 01/25/2013 1700    BMP:    Component Value Date/Time   NA 133* 01/25/2013 1700   K 5.0 01/25/2013 1700   CL 99 01/25/2013 1700   CO2 20 01/25/2013 1700   GLUCOSE 231* 01/25/2013 1700   BUN 45* 01/25/2013 1700   CREATININE 1.59* 01/25/2013 1700   CREATININE 0.98 11/25/2012 1546   CALCIUM 9.5 01/25/2013 1700   GFRNONAA 45* 01/25/2013 1700   GFRAA 52* 01/25/2013 1700    Coagulation: No results found for this basename: INR, PROTIME   No results found for this basename: PTT   EKG: 01/05/13 on chart  CXR: will obtain  Adele Barthel, MD [facility] [place] Office: [phone] Pager: [phone]  01/27/2013, 8:32 AM

## 2013-01-27 NOTE — Progress Notes (Signed)
FMTS Attending Note Patient seen and examined by me today, discussed with resident team and I agree with their assessment and plan. Patient is being covered with vanc/zosyn; most recent fever 12/24 at 2100hrs.  Appreciate surgical input and plans for OR. Dalbert Mayotte, MD

## 2013-01-27 NOTE — Progress Notes (Signed)
MD notified of patient's temp 102.8. Patient medicated with two tylenol and temp decreased to 100.4. No new orders.

## 2013-01-27 NOTE — Progress Notes (Signed)
ANTIBIOTIC CONSULT NOTE - FOLLOW UP  Pharmacy Consult:  Vancomycin / Zosyn Indication:  Diabetic foot ulcer  No Known Allergies  Patient Measurements: Height: 5\' 8"  (172.7 cm) (Simultaneous filing. User may not have seen previous data.) Weight: 184 lb 3.2 oz (83.553 kg) (Simultaneous filing. User may not have seen previous data.) IBW/kg (Calculated) : 68.4  Vital Signs: Temp: 98.2 F (36.8 C) (12/25 0500) Temp src: Oral (12/25 0500) BP: 145/70 mmHg (12/25 0500) Pulse Rate: 77 (12/25 0500) Intake/Output from previous day: 12/24 0701 - 12/25 0700 In: 1357.5 [P.O.:220; I.V.:1087.5; IV Piggyback:50] Out: 1325 [Urine:1325] Intake/Output from this shift: Total I/O In: -  Out: 550 [Urine:550]  Labs:  Recent Labs  01/25/13 1700 01/27/13 0906  WBC 8.3  --   HGB 9.0*  --   PLT 177  --   CREATININE 1.59* 0.98   Estimated Creatinine Clearance: 83.4 ml/min (by C-G formula based on Cr of 0.98). No results found for this basename: VANCOTROUGH, Corlis Leak, VANCORANDOM, [place], GENTPEAK, [place], [employer], TOBRAPEAK, TOBRARND, AMIKACINPEAK, AMIKACINTROU, AMIKACIN,  in the last 72 hours   Microbiology: Recent Results (from the past 720 hour(s))  CULTURE, BLOOD (ROUTINE X 2)     Status: None   Collection Time    01/04/13  3:50 PM      Result Value Range Status   Specimen Description BLOOD LEFT ARM   Final   Special Requests BOTTLES DRAWN AEROBIC AND ANAEROBIC 5CC   Final   Culture  Setup Time     Final   Value: 01/04/2013 20:30     Performed at [facility]   Culture     Final   Value: NO GROWTH 5 DAYS     Performed at [facility]   Report Status 01/10/2013 FINAL   Final  CULTURE, BLOOD (ROUTINE X 2)     Status: None   Collection Time    01/04/13  3:55 PM      Result Value Range Status   Specimen Description BLOOD RIGHT ANTECUBITAL   Final   Special Requests BOTTLES DRAWN AEROBIC AND ANAEROBIC 5CC   Final   Culture  Setup Time     Final   Value:  01/04/2013 20:31     Performed at [facility]   Culture     Final   Value: NO GROWTH 5 DAYS     Performed at [facility]   Report Status 01/10/2013 FINAL   Final  MRSA PCR SCREENING     Status: None   Collection Time    01/05/13 12:50 AM      Result Value Range Status   MRSA by PCR NEGATIVE  NEGATIVE Final   Comment:            The GeneXpert MRSA Assay (FDA     approved for NASAL specimens     only), is one component of a     comprehensive MRSA colonization     surveillance program. It is not     intended to diagnose MRSA     infection nor to guide or     monitor treatment for     MRSA infections.  CULTURE, BLOOD (ROUTINE X 2)     Status: None   Collection Time    01/25/13  6:15 PM      Result Value Range Status   Specimen Description BLOOD ARM LEFT   Final   Special Requests BOTTLES DRAWN AEROBIC ONLY 5CC   Final   Culture  Setup Time     Final   Value: 01/26/2013 00:52     Performed at [facility]   Culture     Final   Value:        BLOOD CULTURE RECEIVED NO GROWTH TO DATE CULTURE WILL BE HELD FOR 5 DAYS BEFORE ISSUING A FINAL NEGATIVE REPORT     Performed at [facility]   Report Status PENDING   Incomplete  CULTURE, BLOOD (ROUTINE X 2)     Status: None   Collection Time    01/25/13  6:30 PM      Result Value Range Status   Specimen Description BLOOD ARM LEFT   Final   Special Requests BOTTLES DRAWN AEROBIC ONLY 5CC   Final   Culture  Setup Time     Final   Value: 01/26/2013 00:52     Performed at [facility]   Culture     Final   Value:        BLOOD CULTURE RECEIVED NO GROWTH TO DATE CULTURE WILL BE HELD FOR 5 DAYS BEFORE ISSUING A FINAL NEGATIVE REPORT     Performed at [facility]   Report Status PENDING   Incomplete      Assessment: 62yom with history of PAD s/p vascular surgery (arteriogram) on 12/11 presents with increased draining, odor, and warmth on his right foot wound (R 5th toe). Found to have right  superficial femoral artery and severe tibial artery occlusive disease. Reportedly there were no good options for revascularization and he was scheduled for a right fifth toe amputation on 02/01/2013.  Patient was started on empiric vancomycin and Zosyn for foot ulcer.  His renal function has improved and his weight is updated.  Vanc 12/23 >> Zosyn 12/23 >>  12/23 blood cx x2 - NGTD   Goal of Therapy:  Vancomycin trough level 10-15 mcg/ml   Plan:  - Change Vanc to 1000mg  IV Q12H - Zosyn 3.375g IV Q8H (4 hour infusion) - Monitor renal function, cultures, trough on Sat - Consider resuming Lantus and titrate up PRN    Arial Galligan D. Mina Marble, PharmD, BCPS Pager:  [phone] 01/27/2013, 11:55 AM

## 2013-01-27 NOTE — Progress Notes (Signed)
Family Medicine Teaching Service Daily Progress Note Intern Pager: [phone]  Patient name: Leonard Leonard [name] Medical record number: [id number] Date of birth: 03-27-1950 Age: 62 y.o. Gender: male  Primary Care Provider: Rosemarie Ax, MD Consultants: vascular Code Status: full  Pt Overview and Major Events to Date:  12/23 - Admitted with gangrainous toe  Assessment and Plan: Leonard Leonard [name] is a 62 y.o. male presenting with increased draining, odor, warmth from his fifth digit on his right foot wound. PMH is significant for uncontrolled DM, HTN, PAD, and dry gangrene of right fifth digit.   #Gangrene of right fifth digit- Followed by Vascular & Vein (Leonard [name]/Leonard [name]/Leonard Leonard [name]) for diabetic wound. Falsely elevated ABI of 86%; multiple previous revascularization procedures on 01/13/13 due to right SFA and severe tibial artery disease  - Followed by vascular surgery, plan right fifth toe amputation on Friday, December 26  - Preop EKG pending, patient reports functional status is unchanged from baseline and does not have significant dyspnea on exertion with activity. No known prior CAD (no prior ischemic workup)   - Continue vancomycin and Zosyn until after amputation.  - Continue to trend fever curve, avoid NSAIDs  # Diabetes - A1C 11.9 on 01/13/13 and pt with history of non adherence  - Hold home glipizide and metformin  - Hold home lantus, cover with SSI, can add back lantus if needed - required 7u SSI yesterday, CBGs in 200s  # HTN - history of high BP (190s) when non adherent, SBP 140s here  - Cont amlodipine, atenolol, clonidine, and lisinopril, holding triamterene/HCTZ   # HLD  - Cont statin   #Social: family would like an in home aid to come help deliver his medications and monitoring of his chronic diseases  - PT/OT consulted, f/u recommendations  - consider c/s to care management for possible home health   FEN/GI: NPO, NS 125 mL/hr  Prophylaxis: Lovenox SQ daily    Disposition: pending surgery and clinical improvement  Subjective: Feeling fine, denies pain  Objective: Temp:  [98 F (36.7 C)-102.8 F (39.3 C)] 98.2 F (36.8 C) (12/25 0500) Pulse Rate:  [66-85] 77 (12/25 0500) Resp:  [18] 18 (12/25 0500) BP: (128-147)/(66-70) 145/70 mmHg (12/25 0500) SpO2:  [96 %-99 %] 97 % (12/25 0500) Physical Exam: General: NAD, well nourished  HEENT: NCAT, MMM Cardiovascular: S1S2, RRR, no murmurs, rubs or gallops  Respiratory: clear breath sounds bilaterally, no wheezes, crackles or rales  Abdomen: soft, non-tender, non-distended.  Extremities: bilateral DP could not be palpated. Dressing just changed after vascular saw so not removed for my exam this am Skin: aside from feet, no other rashes or ulcerations  Neuro: alert and oriented x 3, no focal deficits.   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 The base of the 5th metatarsal is not involved.  Leonard Roux, MD 01/27/2013, 9:55 AM PGY-1, [place] Intern pager: [phone], text pages welcome

## 2013-01-28 ENCOUNTER — Encounter (HOSPITAL_COMMUNITY): Payer: Medicaid Other | Admitting: Anesthesiology

## 2013-01-28 ENCOUNTER — Encounter (HOSPITAL_COMMUNITY)
Admission: AD | Disposition: A | Discharge status: Home Health | Payer: Self-pay | Source: Ambulatory Visit | Attending: Family Medicine

## 2013-01-28 ENCOUNTER — Inpatient Hospital Stay (HOSPITAL_COMMUNITY): Payer: Medicaid Other | Admitting: Anesthesiology

## 2013-01-28 ENCOUNTER — Encounter (HOSPITAL_COMMUNITY): Payer: Self-pay | Admitting: Anesthesiology

## 2013-01-28 HISTORY — PX: AMPUTATION: SHX166

## 2013-01-28 LAB — CBC
HCT: 25.1 % — ABNORMAL LOW (ref 39.0–52.0)
MCH: 26.2 pg (ref 26.0–34.0)
MCV: 82.3 fL (ref 78.0–100.0)
RBC: 3.05 MIL/uL — ABNORMAL LOW (ref 4.22–5.81)
WBC: 6.8 10*3/uL (ref 4.0–10.5)

## 2013-01-28 LAB — BASIC METABOLIC PANEL
BUN: 14 mg/dL (ref 6–23)
CO2: 21 mEq/L (ref 19–32)
Calcium: 8.5 mg/dL (ref 8.4–10.5)
Chloride: 104 mEq/L (ref 96–112)
Creatinine, Ser: 1.05 mg/dL (ref 0.50–1.35)
Potassium: 4.1 mEq/L (ref 3.5–5.1)

## 2013-01-28 LAB — GLUCOSE, CAPILLARY
Glucose-Capillary: 321 mg/dL — ABNORMAL HIGH (ref 70–99)
Glucose-Capillary: 237 mg/dL — ABNORMAL HIGH (ref 70–99)
Glucose-Capillary: 263 mg/dL — ABNORMAL HIGH (ref 70–99)

## 2013-01-28 SURGERY — AMPUTATION DIGIT
Anesthesia: General | Site: Toe | Laterality: Right

## 2013-01-28 MED ORDER — ONDANSETRON HCL 4 MG/2ML IJ SOLN
INTRAMUSCULAR | Status: DC | PRN
  Administered 2013-01-28: 4 mg via INTRAVENOUS

## 2013-01-28 MED ORDER — 0.9 % SODIUM CHLORIDE (POUR BTL) OPTIME
TOPICAL | Status: DC | PRN
  Administered 2013-01-28: 1000 mL

## 2013-01-28 MED ORDER — HYDROMORPHONE HCL PF 1 MG/ML IJ SOLN
INTRAMUSCULAR | Status: AC
  Filled 2013-01-28: qty 1

## 2013-01-28 MED ORDER — LACTATED RINGERS IV SOLN
INTRAVENOUS | Status: DC
  Administered 2013-01-28: 20 mL/h via INTRAVENOUS

## 2013-01-28 MED ORDER — LIDOCAINE HCL (CARDIAC) 20 MG/ML IV SOLN
INTRAVENOUS | Status: DC | PRN
  Administered 2013-01-28: 60 mg via INTRAVENOUS

## 2013-01-28 MED ORDER — HYDROMORPHONE HCL PF 1 MG/ML IJ SOLN
0.2500 mg | INTRAMUSCULAR | Status: DC | PRN
  Administered 2013-01-28 (×3): 0.5 mg via INTRAVENOUS

## 2013-01-28 MED ORDER — PHENYLEPHRINE HCL 10 MG/ML IJ SOLN
INTRAMUSCULAR | Status: DC | PRN
  Administered 2013-01-28 (×2): 80 ug via INTRAVENOUS

## 2013-01-28 MED ORDER — LACTATED RINGERS IV SOLN
INTRAVENOUS | Status: DC | PRN
  Administered 2013-01-28: 14:00:00 via INTRAVENOUS

## 2013-01-28 MED ORDER — BACITRACIN ZINC 500 UNIT/GM EX OINT
TOPICAL_OINTMENT | CUTANEOUS | Status: AC
  Filled 2013-01-28: qty 15

## 2013-01-28 MED ORDER — PROPOFOL 10 MG/ML IV BOLUS
INTRAVENOUS | Status: DC | PRN
  Administered 2013-01-28: 200 mg via INTRAVENOUS

## 2013-01-28 MED ORDER — INSULIN ASPART 100 UNIT/ML ~~LOC~~ SOLN
SUBCUTANEOUS | Status: AC
  Filled 2013-01-28: qty 5

## 2013-01-28 MED ORDER — FENTANYL CITRATE 0.05 MG/ML IJ SOLN
INTRAMUSCULAR | Status: DC | PRN
  Administered 2013-01-28: 25 ug via INTRAVENOUS

## 2013-01-28 MED ORDER — MIDAZOLAM HCL 5 MG/5ML IJ SOLN
INTRAMUSCULAR | Status: DC | PRN
  Administered 2013-01-28: 2 mg via INTRAVENOUS

## 2013-01-28 SURGICAL SUPPLY — 38 items
BANDAGE CONFORM 3  STR LF (GAUZE/BANDAGES/DRESSINGS) ×2 IMPLANT
BANDAGE ELASTIC 4 VELCRO ST LF (GAUZE/BANDAGES/DRESSINGS) ×2 IMPLANT
BANDAGE GAUZE ELAST BULKY 4 IN (GAUZE/BANDAGES/DRESSINGS) ×2 IMPLANT
BLADE AVERAGE 25X9 (BLADE) IMPLANT
BNDG GAUZE ELAST 4 BULKY (GAUZE/BANDAGES/DRESSINGS) ×2 IMPLANT
CANISTER SUCTION 2500CC (MISCELLANEOUS) ×2 IMPLANT
COVER SURGICAL LIGHT HANDLE (MISCELLANEOUS) ×2 IMPLANT
DRAPE EXTREMITY T 121X128X90 (DRAPE) ×2 IMPLANT
ELECT REM PT RETURN 9FT ADLT (ELECTROSURGICAL) ×2
ELECTRODE REM PT RTRN 9FT ADLT (ELECTROSURGICAL) ×1 IMPLANT
GLOVE BIO SURGEON STRL SZ7.5 (GLOVE) ×1 IMPLANT
GLOVE BIOGEL PI IND STRL 6.5 (GLOVE) IMPLANT
GLOVE BIOGEL PI IND STRL 7.0 (GLOVE) IMPLANT
GLOVE BIOGEL PI IND STRL 8 (GLOVE) ×1 IMPLANT
GLOVE BIOGEL PI INDICATOR 6.5 (GLOVE) ×1
GLOVE BIOGEL PI INDICATOR 7.0 (GLOVE) ×2
GLOVE BIOGEL PI INDICATOR 8 (GLOVE)
GLOVE SS BIOGEL STRL SZ 7 (GLOVE) IMPLANT
GLOVE SS BIOGEL STRL SZ 7.5 (GLOVE) IMPLANT
GLOVE SUPERSENSE BIOGEL SZ 7 (GLOVE) ×1
GLOVE SUPERSENSE BIOGEL SZ 7.5 (GLOVE) ×1
GLOVE SURG SS PI 7.0 STRL IVOR (GLOVE) ×1 IMPLANT
GOWN STRL NON-REIN LRG LVL3 (GOWN DISPOSABLE) ×6 IMPLANT
GOWN STRL REIN XL XLG (GOWN DISPOSABLE) ×2 IMPLANT
KIT BASIN OR (CUSTOM PROCEDURE TRAY) ×2 IMPLANT
KIT ROOM TURNOVER OR (KITS) ×2 IMPLANT
NS IRRIG 1000ML POUR BTL (IV SOLUTION) ×2 IMPLANT
PACK GENERAL/GYN (CUSTOM PROCEDURE TRAY) ×2 IMPLANT
PAD ARMBOARD 7.5X6 YLW CONV (MISCELLANEOUS) ×4 IMPLANT
SPECIMEN JAR SMALL (MISCELLANEOUS) ×2 IMPLANT
SPONGE GAUZE 4X4 12PLY (GAUZE/BANDAGES/DRESSINGS) ×2 IMPLANT
SUT ETHILON 3 0 PS 1 (SUTURE) ×2 IMPLANT
SWAB COLLECTION DEVICE MRSA (MISCELLANEOUS) ×1 IMPLANT
TOWEL OR 17X24 6PK STRL BLUE (TOWEL DISPOSABLE) ×2 IMPLANT
TOWEL OR 17X26 10 PK STRL BLUE (TOWEL DISPOSABLE) ×2 IMPLANT
TUBE ANAEROBIC SPECIMEN COL (MISCELLANEOUS) ×1 IMPLANT
UNDERPAD 30X30 INCONTINENT (UNDERPADS AND DIAPERS) ×2 IMPLANT
WATER STERILE IRR 1000ML POUR (IV SOLUTION) ×2 IMPLANT

## 2013-01-28 NOTE — Transfer of Care (Signed)
Immediate Anesthesia Transfer of Care Note  Patient: Leonard [name]  Procedure(s) Performed: Procedure(s): RAY AMPUTATION RIGHT  4th & 5th TOE (Right)  Patient Location: PACU  Anesthesia Type:General  Level of Consciousness: awake, alert  and oriented  Airway & Oxygen Therapy: Patient Spontanous Breathing and Patient connected to nasal cannula oxygen  Post-op Assessment: Report given to PACU RN, Post -op Vital signs reviewed and stable and Patient moving all extremities X 4  Post vital signs: Reviewed and stable  Complications: No apparent anesthesia complications

## 2013-01-28 NOTE — Anesthesia Preprocedure Evaluation (Addendum)
Anesthesia Evaluation  Patient identified by MRN, date of birth, ID band Patient awake    Reviewed: Allergy & Precautions, H&P , NPO status , Patient's Chart, lab work & pertinent test results  Airway Mallampati: II      Dental   Pulmonary Current Smoker,  breath sounds clear to auscultation        Cardiovascular hypertension, + Peripheral Vascular Disease Rhythm:Regular Rate:Normal     Neuro/Psych    GI/Hepatic negative GI ROS, Neg liver ROS,   Endo/Other  diabetes  Renal/GU Renal disease     Musculoskeletal   Abdominal   Peds  Hematology   Anesthesia Other Findings   Reproductive/Obstetrics                        Anesthesia Physical Anesthesia Plan  ASA: III  Anesthesia Plan: General   Post-op Pain Management:    Induction: Intravenous  Airway Management Planned: Oral ETT  Additional Equipment:   Intra-op Plan:   Post-operative Plan: Extubation in OR  Informed Consent:   Dental advisory given  Plan Discussed with: CRNA, Anesthesiologist and Surgeon  Anesthesia Plan Comments:        Anesthesia Quick Evaluation

## 2013-01-28 NOTE — Anesthesia Postprocedure Evaluation (Signed)
  Anesthesia Post-op Note  Patient: Leonard [name]  Procedure(s) Performed: Procedure(s): RAY AMPUTATION RIGHT  4th & 5th TOE (Right)  Patient Location: PACU  Anesthesia Type:General  Level of Consciousness: awake  Airway and Oxygen Therapy: Patient Spontanous Breathing  Post-op Pain: mild  Post-op Assessment: Post-op Vital signs reviewed  Post-op Vital Signs: Reviewed  Complications: No apparent anesthesia complications

## 2013-01-28 NOTE — H&P (View-Only) (Signed)
[facility]in Specialist of [facility]  [name] MRN: [id number] DOB: [birth year] Sex: male  REASON FOR CONSULT: Worsening wound of the right fifth toe. Consult is from family medicine.  HPI: Leonard [name] is a 62 y.o. male who was seen in consultation by Dr. Kellie Simmering on 01/05/2013. He had a wound on the right fifth toe which had progressively been getting worse over the last few months. At that time, he had an ABI of 86% on the right but this was felt to be falsely elevated because of his diabetes. He did have a palpable popliteal pulse on exam.   He underwent an arteriogram by Dr. Bridgett Larsson on 01/13/2013. He was noted to have disease of his right superficial femoral artery and also severe tibial artery occlusive disease. On 01/13/2013 he underwent atherectomy of the right superficial femoral artery and angioplasty of the right superficial femoral artery. He was scheduled for a right fourth and fifth toe amputation on 02/01/2013. He was admitted yesterday by the family medicine service because of some cellulitis in the right foot and he is being treated with antibiotics. Vascular surgery was consult to determine if the toe needed to be amputated sooner.  Past Medical History  Diagnosis Date  . Hypertension   . PAD (peripheral artery disease)   . Type II diabetes mellitus   . Gangrene of toe     right 5th/notes 01/04/2013     Family History  Problem Relation Age of Onset  . Cancer Mother     lung cancer  . Hypertension Mother   . Cancer Sister     patient thinks it was uterine cancer  . Hypertension Sister     SOCIAL HISTORY: History  Substance Use Topics  . Smoking status: Current Every Day Smoker -- 1.00 packs/day for 44 years    Types: Cigarettes  . Smokeless tobacco: Never Used  . Alcohol Use: No    No Known Allergies  Current Facility-Administered Medications  Medication Dose Route Frequency Provider Last Rate Last Dose  . 0.9 %  sodium chloride  infusion   Intravenous Continuous Rosemarie Ax, MD 125 mL/hr at 01/26/13 [phone]    . acetaminophen (TYLENOL) tablet 650 mg  650 mg Oral Q6H PRN Rosemarie Ax, MD   650 mg at 01/25/13 2021   Or  . acetaminophen (TYLENOL) suppository 650 mg  650 mg Rectal Q6H PRN Rosemarie Ax, MD      . amLODipine (NORVASC) tablet 10 mg  10 mg Oral Daily Rosemarie Ax, MD      . atenolol (TENORMIN) tablet 100 mg  100 mg Oral Daily Rosemarie Ax, MD      . cloNIDine (CATAPRES) tablet 0.2 mg  0.2 mg Oral BID Rosemarie Ax, MD      . heparin injection 5,000 Units  5,000 Units Subcutaneous Q8H Rosemarie Ax, MD   5,000 Units at 01/26/13 [phone]  . ibuprofen (ADVIL,MOTRIN) tablet 400 mg  400 mg Oral Q6H PRN Beverlyn Roux, MD      . insulin aspart (novoLOG) injection 0-9 Units  0-9 Units Subcutaneous TID WC Rosemarie Ax, MD   5 Units at 01/25/13 1824  . lisinopril (PRINIVIL,ZESTRIL) tablet 40 mg  40 mg Oral Daily Rosemarie Ax, MD      . oxyCODONE (Oxy IR/ROXICODONE) immediate release tablet 5 mg  5 mg Oral Q6H PRN Beverlyn Roux, MD      . piperacillin-tazobactam (ZOSYN) IVPB 3.375 g  3.375 g Intravenous Q8H Benjamine Sprague [place], [place]   3.375 g at 01/26/13 0303  . simvastatin (ZOCOR) tablet 20 mg  20 mg Oral q1800 Rosemarie Ax, MD      . vancomycin (VANCOCIN) 1,250 mg in sodium chloride 0.9 % 250 mL IVPB  1,250 mg Intravenous Q12H Benjamine Sprague [place], RPH   1,250 mg at 01/26/13 [id number]    REVIEW OF SYSTEMS: Valu.Nieves ] denotes positive finding; [  ] denotes negative finding CARDIOVASCULAR:  [ ]  chest pain   [ ]  chest pressure   [ ]  palpitations   [ ]  orthopnea   [ ]  dyspnea on exertion   [ ]  claudication   [ ]  rest pain   [ ]  DVT   [ ]  phlebitis PULMONARY:   [ ]  productive cough   [ ]  asthma   [ ]  wheezing NEUROLOGIC:   [ ]  weakness  [ ]  paresthesias  [ ]  aphasia  [ ]  amaurosis  [ ]  dizziness HEMATOLOGIC:   [ ]  bleeding problems   [ ]  clotting disorders MUSCULOSKELETAL:  [ ]  joint pain   [ ]  joint  swelling [ ]  leg swelling GASTROINTESTINAL: [ ]   blood in stool  [ ]   hematemesis GENITOURINARY:  [ ]   dysuria  [ ]   hematuria PSYCHIATRIC:  [ ]  history of major depression INTEGUMENTARY:  [ ]  rashes  [ ]  ulcers CONSTITUTIONAL:  [ ]  fever   [ ]  chills  PHYSICAL EXAM: Filed Vitals:   01/25/13 2300 01/26/13 0049 01/26/13 0500 01/26/13 0526  BP: 102/61  120/49   Pulse: 87  84   Temp: 102 F (38.9 C) 99.4 F (37.4 C) 100 F (37.8 C) 100.1 F (37.8 C)  TempSrc: Oral Oral Oral Oral  Resp: 18  18   Height:      Weight:      SpO2: 96%  99%    Body mass index is 28.01 kg/(m^2). GENERAL: The patient is a well-nourished male, in no acute distress. The vital signs are documented above. CARDIOVASCULAR: There is a regular rate and rhythm. I do not detect carotid bruits. He has a palpable femoral pulses and diminished popliteal pulse on the right. I cannot palpate pedal pulses on the right. PULMONARY: There is good air exchange bilaterally without wheezing or rales. ABDOMEN: Soft and non-tender with normal pitched bowel sounds.  MUSCULOSKELETAL: He has a gangrenous wound of his right fifth toe. NEUROLOGIC: No focal weakness or paresthesias are detected. SKIN: There are no ulcers or rashes noted. PSYCHIATRIC: The patient has a normal affect.  DATA:  Lab Results  Component Value Date   WBC 8.3 01/25/2013   HGB 9.0* 01/25/2013   HCT 27.2* 01/25/2013   MCV 81.4 01/25/2013   PLT 177 01/25/2013   Lab Results  Component Value Date   NA 133* 01/25/2013   K 5.0 01/25/2013   CL 99 01/25/2013   CO2 20 01/25/2013   Lab Results  Component Value Date   CREATININE 1.59* 01/25/2013   Lab Results  Component Value Date   HGBA1C 11.9* 01/13/2013   CBG (last 3)   Recent Labs  01/25/13 1653 01/25/13 2248  GLUCAP 251* 160*   His arteriogram shows severe tibial artery occlusive disease.  MEDICAL ISSUES: Patient presents with a gangrenous wound of his right fifth toe. He is scheduled for  toe amputation on 02/01/2013. He is currently on intravenous vancomycin and Zosyn. We could potentially proceed with right fifth toe amputation on Friday. Dr.  Kellie Simmering who originally saw the patient is on vacation and I will discuss the patient with Dr. Bridgett Larsson who has previously seen him.  [place] [place] Beeper: [phone]

## 2013-01-28 NOTE — Interval H&P Note (Signed)
History and Physical Interval Note:  01/28/2013 1:37 PM  Leonard [name]  has presented today for surgery, with the diagnosis of Peripheral Vascular Disease with Gangrene  The various methods of treatment have been discussed with the patient and family. After consideration of risks, benefits and other options for treatment, the patient has consented to  Procedure(s): AMPUTATION DIGIT 4th & 5th TOE (Right) as a surgical intervention .  The patient's history has been reviewed, patient examined, no change in status, stable for surgery.  I have reviewed the patient's chart and labs.  Questions were answered to the patient's satisfaction.     Shatarra Wehling

## 2013-01-28 NOTE — Preoperative (Signed)
Beta Blockers   Reason not to administer Beta Blockers:Not Applicable 

## 2013-01-28 NOTE — Op Note (Signed)
    OPERATIVE REPORT  DATE OF SURGERY: 01/28/2013  PATIENT: Leonard [name], 62 y.o. male MRN: [id number]  DOB: 11-25-1950  PRE-OPERATIVE DIAGNOSIS: Gangrene right fourth and fifth toe  POST-OPERATIVE DIAGNOSIS:  Same  PROCEDURE: Open amputation of right fourth and fifth toe  SURGEON:  Curt Jews, M.D.  PHYSICIAN ASSISTANT: Nurse  ANESTHESIA:  Gen.  EBL: Minimal ml  Total I/O In: 400 [I.V.:400] Out: 360 [Urine:350; Blood:10]  BLOOD ADMINISTERED: None  DRAINS: None  SPECIMEN: Ray amputation of right fourth and fifth toe and aerobic and anaerobic cultures  COUNTS CORRECT:  YES  PLAN OF CARE: PACU   PATIENT DISPOSITION:  PACU - hemodynamically stable  PROCEDURE DETAILS: The patient was taken to the operating room placed supine position where the right eye was prepped in usual fashion. Initially an incision was made between the level of the fourth and fifth toes and carried down to the metatarsal head. There was obvious necrosis of the fourth metatarsal head as well and therefore a fourth and fifth ray amputation were undertaken. The skin itself was viable and had very good bleeding. This was taken down to the metatarsal bone of the fourth and fifth digits. There was frank pus in the metatarsal head area tracking up the metatarsal bone. This was sent for aerobic and anaerobic culture. The rongeurs were used to resect the metatarsal bones further proximally and all nonviable tissue was debrided. The hemostasis was obtained electrocautery. The wounds were packed with Betadine soaked Curlex and a Kerlix was passed over the foot. The patient was taken to the recovery room in stable condition   Curt Jews, M.D. 01/28/2013 3:06 PM

## 2013-01-28 NOTE — Progress Notes (Signed)
FMTS Attending Note  I personally saw and evaluated the patient. The plan of care was discussed with the resident team. I agree with the assessment and plan as documented by the resident.   Patient to go to OR today for amputation.  Dossie Arbour MD

## 2013-01-28 NOTE — Progress Notes (Signed)
Family Medicine Teaching Service Daily Progress Note Intern Pager: [phone]  [name] Medical record number: [id number] Date of birth: 07-26-1950 Age: 62 y.o. Gender: male  Primary Care Provider: Rosemarie Ax, MD Consultants: vascular Code Status: full  Pt Overview and Major Events to Date:  12/23 - Admitted with gangrainous toe  Assessment and Plan: Leonard [name] is a 62 y.o. male presenting with increased draining, odor, warmth from his fifth digit on his right foot wound. PMH is significant for uncontrolled DM, HTN, PAD, and dry gangrene of right fifth digit.   #Gangrene of right fifth digit- Followed by Vascular & Vein ([name]) for diabetic wound. Falsely elevated ABI of 86%; multiple previous revascularization procedures on 01/13/13 due to right SFA and severe tibial artery disease  - Followed by vascular surgery, plan right fifth toe amputation today - Continue vancomycin and Zosyn until after amputation.  - Continue to trend fever curve, avoid NSAIDs  # Diabetes - A1C 11.9 on 01/13/13 and pt with history of non adherence  - Hold home glipizide and metformin  - Hold home lantus, cover with SSI, can add back lantus if needed - required 9u SSI yesterday, CBGs in 200-300s  # HTN - history of high BP (190s) when non adherent, SBP 140s here  - Cont amlodipine, atenolol, clonidine, and lisinopril, holding triamterene/HCTZ   # HLD  - Cont statin   #Social: family would like an in home aid to come help deliver his medications and monitoring of his chronic diseases  - PT/OT consulted, f/u recommendations  - consider c/s to care management for possible home health   FEN/GI: NPO, NS 125 mL/hr  Prophylaxis: Lovenox SQ daily   Disposition: pending surgery and clinical improvement  Subjective: Feeling fine, denies pain  Objective: Temp:  [98.4 F (36.9 C)-98.9 F (37.2 C)] 98.4 F (36.9 C) (12/26 0520) Pulse Rate:  [58-77] 75 (12/26  0520) Resp:  [16-18] 16 (12/26 0520) BP: (120-147)/(54-70) 136/68 mmHg (12/26 0520) SpO2:  [98 %-99 %] 98 % (12/26 0520) Physical Exam: General: NAD, well nourished  HEENT: NCAT, MMM Cardiovascular: S1S2, RRR, no murmurs, rubs or gallops  Respiratory: clear breath sounds bilaterally, no wheezes, crackles or rales  Abdomen: soft, non-tender, non-distended.  Extremities: bilateral DP could not be palpated. Dressing in place Skin: aside from feet, no other rashes or ulcerations  Neuro: alert and oriented x 3, no focal deficits.    Laboratory:  Recent Labs Lab 01/25/13 1700 01/28/13 0613  WBC 8.3 6.8  HGB 9.0* 8.0*  HCT 27.2* 25.1*  PLT 177 156    Recent Labs Lab 01/25/13 1700 01/27/13 0906 01/28/13 0613  NA 133* 133* 136  K 5.0 4.0 4.1  CL 99 100 104  CO2 20 20 21   BUN 45* 19 14  CREATININE 1.59* 0.98 1.05  CALCIUM 9.5 8.9 8.5  PROT 7.5  --   --   BILITOT 0.2*  --   --   ALKPHOS 97  --   --   ALT 10  --   --   AST 11  --   --   GLUCOSE 231* 291* 316*   Imaging/Diagnostic Tests: MR foot: Osteomyelitis involving entire shaft of the 5th metatarsal and the phalangeal bones of the little toe. The base of the 5th metatarsal is not involved.  Beverlyn Roux, MD 01/28/2013, 8:35 AM PGY-1, [place] Intern pager: [phone], text pages welcome

## 2013-01-29 DIAGNOSIS — M9080 Osteopathy in diseases classified elsewhere, unspecified site: Secondary | ICD-10-CM

## 2013-01-29 DIAGNOSIS — L97509 Non-pressure chronic ulcer of other part of unspecified foot with unspecified severity: Secondary | ICD-10-CM

## 2013-01-29 DIAGNOSIS — S98139A Complete traumatic amputation of one unspecified lesser toe, initial encounter: Secondary | ICD-10-CM

## 2013-01-29 DIAGNOSIS — M869 Osteomyelitis, unspecified: Secondary | ICD-10-CM

## 2013-01-29 DIAGNOSIS — E1169 Type 2 diabetes mellitus with other specified complication: Secondary | ICD-10-CM | POA: Diagnosis present

## 2013-01-29 LAB — CBC
HCT: 23.8 % — ABNORMAL LOW (ref 39.0–52.0)
Hemoglobin: 7.8 g/dL — ABNORMAL LOW (ref 13.0–17.0)
MCH: 26.8 pg (ref 26.0–34.0)
MCHC: 32.8 g/dL (ref 30.0–36.0)
MCV: 81.8 fL (ref 78.0–100.0)

## 2013-01-29 LAB — GLUCOSE, CAPILLARY
Glucose-Capillary: 273 mg/dL — ABNORMAL HIGH (ref 70–99)
Glucose-Capillary: 389 mg/dL — ABNORMAL HIGH (ref 70–99)
Glucose-Capillary: 305 mg/dL — ABNORMAL HIGH (ref 70–99)

## 2013-01-29 LAB — BASIC METABOLIC PANEL
BUN: 11 mg/dL (ref 6–23)
Calcium: 8.5 mg/dL (ref 8.4–10.5)
Creatinine, Ser: 0.88 mg/dL (ref 0.50–1.35)
GFR calc non Af Amer: >90 mL/min (ref >90)
Glucose, Bld: 296 mg/dL — ABNORMAL HIGH (ref 70–99)

## 2013-01-29 MED ORDER — HEPARIN SODIUM (PORCINE) 5000 UNIT/ML IJ SOLN
5000.0000 [IU] | Freq: Three times a day (TID) | INTRAMUSCULAR | Status: DC
  Administered 2013-01-29 – 2013-02-01 (×10): 5000 [IU] via SUBCUTANEOUS
  Filled 2013-01-29 (×12): qty 1

## 2013-01-29 MED ORDER — HYDRALAZINE HCL 20 MG/ML IJ SOLN: 5.0000 mg | Freq: Four times a day (QID) | INTRAMUSCULAR | Status: DC | PRN

## 2013-01-29 MED ORDER — INSULIN GLARGINE 100 UNIT/ML ~~LOC~~ SOLN
25.0000 [IU] | Freq: Every day | SUBCUTANEOUS | Status: DC
  Administered 2013-01-29: 25 [IU] via SUBCUTANEOUS
  Filled 2013-01-29 (×2): qty 0.25

## 2013-01-29 MED ORDER — INSULIN ASPART 100 UNIT/ML ~~LOC~~ SOLN
0.0000 [IU] | Freq: Three times a day (TID) | SUBCUTANEOUS | Status: DC
  Administered 2013-01-30: 11 [IU] via SUBCUTANEOUS

## 2013-01-29 MED ORDER — OXYCODONE HCL 5 MG PO TABS
5.0000 mg | ORAL_TABLET | ORAL | Status: DC | PRN
  Administered 2013-01-31 – 2013-02-01 (×3): 5 mg via ORAL
  Filled 2013-01-29: qty 2
  Filled 2013-01-29 (×2): qty 1

## 2013-01-29 MED ORDER — INSULIN ASPART 100 UNIT/ML ~~LOC~~ SOLN
0.0000 [IU] | Freq: Every day | SUBCUTANEOUS | Status: DC
  Administered 2013-01-29: 4 [IU] via SUBCUTANEOUS

## 2013-01-29 NOTE — Progress Notes (Signed)
Family Medicine Teaching Service Daily Progress Note Intern Pager: [phone]  [name] Medical record number: [id number] Date of birth: November 03, 1950 Age: 62 y.o. Gender: male  Primary Care Provider: Rosemarie Ax, MD Consultants: vascular Code Status: full  Pt Overview and Major Events to Date:  12/23 - Admitted with gangrainous toe 12/26 - amputation of 4th and 5th toes by vascular  Assessment and Plan: Leonard [name] is a 62 y.o. male presenting with increased draining, odor, warmth from his fifth digit on his right foot wound. PMH is significant for uncontrolled DM, HTN, PAD, and dry gangrene of right fifth digit.   # Gangrene of right fifth digit- Followed by Vascular & Vein ([name]) for diabetic wound. Falsely elevated ABI of 86%; multiple previous revascularization procedures on 01/13/13 due to right SFA and severe tibial artery disease  - POD1 from amputation of right 4th and 5th toe by vascular surgery - Continue vancomycin and Zosyn pending ortho recs.  - Continue to trend fever curve, avoid NSAIDs - consult ID for treatment of remaining osteo, f/u recommendations  # Diabetes - A1C 11.9 on 01/13/13 and pt with history of non adherence  - Hold home glipizide and metformin  - Hold home lantus, cover with SSI, can add back lantus if needed - required 13u SSI yesterday, CBGs in 200-300s  # HTN - history of high BP (190s) when non adherent, SBP 140s here  - Cont amlodipine, atenolol, clonidine, and lisinopril, holding triamterene/HCTZ   # HLD  - Cont statin   #Social: family would like an in home aid to come help deliver his medications and monitoring of his chronic diseases  - PT/OT consulted, f/u recommendations  - consider c/s to care management for possible home health   FEN/GI: NPO, NS 125 mL/hr  Prophylaxis: Lovenox SQ daily   Disposition: pending clinical improvement and arrangement of outpatient antibiotic  treatment  Subjective: Feeling fine, some pain in foot, well controlled with oxy but wears off early  Objective: Temp:  [97.6 F (36.4 C)-99.5 F (37.5 C)] 99.1 F (37.3 C) (12/27 0535) Pulse Rate:  [64-81] 77 (12/27 0535) Resp:  [12-18] 16 (12/27 0535) BP: (129-169)/(63-79) 129/63 mmHg (12/27 0535) SpO2:  [90 %-100 %] 98 % (12/27 0535) Physical Exam: General: NAD, well nourished  HEENT: NCAT, MMM Cardiovascular: S1S2, RRR, no murmurs, rubs or gallops  Respiratory: clear breath sounds bilaterally, no wheezes, crackles or rales  Abdomen: soft, non-tender, non-distended.  Extremities: bilateral DP could not be palpated. Dressing in place Skin: aside from feet, no other rashes or ulcerations  Neuro: alert and oriented x 3, no focal deficits.    Laboratory:  Recent Labs Lab 01/25/13 1700 01/28/13 0613 01/29/13 0440  WBC 8.3 6.8 6.1  HGB 9.0* 8.0* 7.8*  HCT 27.2* 25.1* 23.8*  PLT 177 156 168    Recent Labs Lab 01/25/13 1700 01/27/13 0906 01/28/13 0613 01/29/13 0440  NA 133* 133* 136 135  K 5.0 4.0 4.1 3.8  CL 99 100 104 102  CO2 20 20 21 21   BUN 45* 19 14 11   CREATININE 1.59* 0.98 1.05 0.88  CALCIUM 9.5 8.9 8.5 8.5  PROT 7.5  --   --   --   BILITOT 0.2*  --   --   --   ALKPHOS 97  --   --   --   ALT 10  --   --   --   AST 11  --   --   --  GLUCOSE 231* 291* 316* 296*   Imaging/Diagnostic Tests: MR foot: Osteomyelitis involving entire shaft of the 5th metatarsal and the phalangeal bones of the little toe. The base of the 5th metatarsal is not involved.  Beverlyn Roux, MD 01/29/2013, 9:09 AM PGY-1, [place] Intern pager: [phone], text pages welcome

## 2013-01-29 NOTE — Consult Note (Signed)
[name] for [facility]    Date of Admission:  01/25/2013           Day 5 vancomycin        Day 5 piperacillin tazobactam       Reason for Consult: Diabetic foot osteomyelitis    Referring Physician: Dr. Sherren Mocha McDiarmid   Principal Problem:   Diabetic osteomyelitis Active Problems:   Mixed hyperlipidemia   TOBACCO ABUSE   Diabetes mellitus, type 2   Essential hypertension, benign   Gangrene of foot, right fifth metatarsal with osteomyelitis   Diabetic ulcer of toe, R 1st plantar surface, viable   Acute kidney injury   Normocytic anemia   . amLODipine  10 mg Oral Daily  . atenolol  100 mg Oral Daily  . cloNIDine  0.2 mg Oral BID  . heparin subcutaneous  5,000 Units Subcutaneous Q8H  . insulin aspart  0-9 Units Subcutaneous TID WC  . lisinopril  40 mg Oral Daily  . nicotine  14 mg Transdermal Daily  . piperacillin-tazobactam (ZOSYN)  IV  3.375 g Intravenous Q8H  . simvastatin  20 mg Oral q1800  . vancomycin  1,000 mg Intravenous Q12H    Recommendations: 1. Continue current antibiotics pending results of operative cultures   Assessment: Although he has had amputation of his infected toes and metatarsal heads I would prefer continuing antibiotics for 10-14 days postoperatively to try to improve wound healing. I will hold off on PICC placement and see if we can come up with an oral regimen for him. I will follow up tomorrow.   HPI: Leonard [name] is a 62 y.o. male with diabetes and a long-standing right foot ulcer. He cannot tell me exactly how long the ulcer had been president but simply states for "a while". He has been followed by Dr. Blenda Mounts local foot surgeon recalls being on antibiotics on multiple occasions. I called his pharmacy but currently it is closed she was unable to find out specifics about his recent antibiotic therapy. He was admitted several days ago and underwent right fourth and fifth toe and metatarsal ray amputations for  osteomyelitis. Operative Gram stain shows no organisms and cultures are pending.   Review of Systems: Constitutional: positive for malaise, negative for anorexia, chills, fevers, sweats and weight loss Eyes: negative Ears, nose, mouth, throat, and face: negative Respiratory: negative Cardiovascular: negative Gastrointestinal: negative Genitourinary:negative  Past Medical History  Diagnosis Date  . Hypertension   . PAD (peripheral artery disease)   . Gangrene of toe     right 5th/notes 01/04/2013   . High cholesterol   . Type II diabetes mellitus     History  Substance Use Topics  . Smoking status: Current Every Day Smoker -- 1.00 packs/day for 44 years    Types: Cigarettes  . Smokeless tobacco: Never Used  . Alcohol Use: No    Family History  Problem Relation Age of Onset  . Cancer Mother     lung cancer  . Hypertension Mother   . Cancer Sister     patient thinks it was uterine cancer  . Hypertension Sister    No Known Allergies  OBJECTIVE: Blood pressure 129/63, pulse 77, temperature 99.1 F (37.3 C), temperature source Oral, resp. rate 16, height 5\' 8"  (1.727 m), weight 83.553 kg (184 lb 3.2 oz), SpO2 98.00%. General: He is in no distress watching television  Skin: No rash Lungs: Clear Cor: Regular S1 and S2  no murmurs Abdomen: Nontender Clean dry cough dressing on right foot; the fourth and fifth toes are surgically absent  Lab Results Lab Results  Component Value Date   WBC 6.1 01/29/2013   HGB 7.8* 01/29/2013   HCT 23.8* 01/29/2013   MCV 81.8 01/29/2013   PLT 168 01/29/2013    Lab Results  Component Value Date   CREATININE 0.88 01/29/2013   BUN 11 01/29/2013   NA 135 01/29/2013   K 3.8 01/29/2013   CL 102 01/29/2013   CO2 21 01/29/2013    Lab Results  Component Value Date   ALT 10 01/25/2013   AST 11 01/25/2013   ALKPHOS 97 01/25/2013   BILITOT 0.2* 01/25/2013     Microbiology: Recent Results (from the past 240 hour(s))  CULTURE,  BLOOD (ROUTINE X 2)     Status: None   Collection Time    01/25/13  6:15 PM      Result Value Range Status   Specimen Description BLOOD ARM LEFT   Final   Special Requests BOTTLES DRAWN AEROBIC ONLY 5CC   Final   Culture  Setup Time     Final   Value: 01/26/2013 00:52     Performed at [facility]   Culture     Final   Value:        BLOOD CULTURE RECEIVED NO GROWTH TO DATE CULTURE WILL BE HELD FOR 5 DAYS BEFORE ISSUING A FINAL NEGATIVE REPORT     Performed at [facility]   Report Status PENDING   Incomplete  CULTURE, BLOOD (ROUTINE X 2)     Status: None   Collection Time    01/25/13  6:30 PM      Result Value Range Status   Specimen Description BLOOD ARM LEFT   Final   Special Requests BOTTLES DRAWN AEROBIC ONLY 5CC   Final   Culture  Setup Time     Final   Value: 01/26/2013 00:52     Performed at [facility]   Culture     Final   Value:        BLOOD CULTURE RECEIVED NO GROWTH TO DATE CULTURE WILL BE HELD FOR 5 DAYS BEFORE ISSUING A FINAL NEGATIVE REPORT     Performed at [facility]   Report Status PENDING   Incomplete  WOUND CULTURE     Status: None   Collection Time    01/28/13  2:19 PM      Result Value Range Status   Specimen Description WOUND RIGHT FOOT   Final   Special Requests NONE SWAB FROM RIGHT 4TH AND 5TH TOE PT ON ZOSYN   Final   Gram Stain     Final   Value: RARE WBC PRESENT, PREDOMINANTLY PMN     NO SQUAMOUS EPITHELIAL CELLS SEEN     NO ORGANISMS SEEN     Performed at [facility]   Culture     Final   Value: NO GROWTH 1 DAY     Performed at [facility]   Report Status PENDING   Incomplete  ANAEROBIC CULTURE     Status: None   Collection Time    01/28/13  2:19 PM      Result Value Range Status   Specimen Description WOUND RIGHT FOOT   Final   Special Requests NONE SWAB FROM RIGHT 4TH AND 5TH TOE PT ON ZOSYN   Final   Gram Stain     Final  Value: RARE WBC PRESENT, PREDOMINANTLY PMN     NO SQUAMOUS  EPITHELIAL CELLS SEEN     NO ORGANISMS SEEN     Performed at [facility]   Culture     Final   Value: NO ANAEROBES ISOLATED; CULTURE IN PROGRESS FOR 5 DAYS     Performed at [facility]   Report Status PENDING   Incomplete    Michel Bickers, MD [facility] [place] [phone] pager   [phone] cell 01/29/2013, 1:44 PM

## 2013-01-29 NOTE — Evaluation (Signed)
Occupational Therapy Evaluation Patient Details Name: Leonard [name] MRN: [id number] DOB: Dec 18, 1950 Today's Date: 01/29/2013 Time: [id number] OT Time Calculation (min): 15 min  OT Assessment / Plan / Recommendation History of present illness Leonard [name] is a 62 y.o. male presenting with increased draining, odor, warmth from his fifth digit on his right foot wound. PMH is significant for uncontrolled DM, HTN, PAD, and dry gangrene of right fifth digit.  Patient s/p amputation rt. 4th and 5th toes.   Clinical Impression   Pt admitted with above. Will continue to follow acutely in order to address below problem list. Recommending 24/7 supervision/assist for home.    OT Assessment  Patient needs continued OT Services    Follow Up Recommendations  No OT follow up;Supervision/Assistance - 24 hour    Barriers to Discharge      Equipment Recommendations  3 in 1 bedside comode    Recommendations for Other Services    Frequency  Min 2X/week    Precautions / Restrictions Precautions Precautions: Fall Restrictions Weight Bearing Restrictions: Yes RLE Weight Bearing: Partial weight bearing RLE Partial Weight Bearing Percentage or Pounds: Heel only   Pertinent Vitals/Pain See vitals    ADL  Grooming: Performed;Wash/dry hands;Wash/dry face;Teeth care;Min guard Lower Body Dressing: Performed;Minimal assistance Where Assessed - Lower Body Dressing: Unsupported sitting Toilet Transfer: Simulated;Min guard Toilet Transfer Method: Sit to Loss adjuster, chartered:  (bed) Equipment Used: Gait belt;Rolling walker Transfers/Ambulation Related to ADLs: Min guard with RW.  VCs for safety with RW. ADL Comments: Pt impulsive with RW, requires VCs for safe use of DME. Ambulated to sink to perform grooming tasks then returned to bed.    OT Diagnosis: Generalized weakness;Acute pain  OT Problem List: Impaired balance (sitting and/or standing);Decreased safety awareness;Decreased  knowledge of use of DME or AE;Decreased knowledge of precautions;Pain OT Treatment Interventions: Self-care/ADL training;DME and/or AE instruction;Therapeutic activities;Patient/family education;Balance training;Cognitive remediation/compensation   OT Goals(Current goals can be found in the care plan section) Acute Rehab OT Goals Patient Stated Goal: To go home soon OT Goal Formulation: With patient Time For Goal Achievement: 02/05/13 Potential to Achieve Goals: Good  Visit Information  Last OT Received On: 01/29/13 Assistance Needed: +1 History of Present Illness: Leonard [name] is a 62 y.o. male presenting with increased draining, odor, warmth from his fifth digit on his right foot wound. PMH is significant for uncontrolled DM, HTN, PAD, and dry gangrene of right fifth digit.  Patient s/p amputation rt. 4th and 5th toes.       Prior [place] expects to be discharged to:: Private residence Living Arrangements: Other relatives Available Help at Discharge: Family;Available 24 hours/day Type of Home: Apartment Home Access: Level entry Home Layout: One level Home Equipment: None Prior Function Level of Independence: Independent Communication Communication: No difficulties         Vision/Perception     Cognition  Cognition Arousal/Alertness: Awake/alert Behavior During Therapy: Impulsive Overall Cognitive Status: Impaired/Different from baseline Area of Impairment: Safety/judgement Safety/Judgement: Decreased awareness of safety    Extremity/Trunk Assessment Upper Extremity Assessment Upper Extremity Assessment: Overall WFL for tasks assessed Lower Extremity Assessment Lower Extremity Assessment: Overall WFL for tasks assessed;RLE deficits/detail RLE Deficits / Details: Amputation rt 4th and 5th toes Cervical / Trunk Assessment Cervical / Trunk Assessment: Normal     Mobility Bed Mobility Bed Mobility: Supine to Sit;Sitting -  Scoot to Edge of Bed;Sit to Supine Supine to Sit: 7: Independent Sitting - Scoot to  Edge of Bed: 7: Independent Sit to Supine: 7: Independent Transfers Transfers: Sit to Stand;Stand to Sit Sit to Stand: 4: Min guard;From bed Stand to Sit: 4: Min guard;To bed     Exercise     Balance     End of Session OT - End of Session Equipment Utilized During Treatment: Gait belt;Rolling walker Activity Tolerance: Patient tolerated treatment well Patient left: in bed;with call bell/phone within reach  GO   01/29/2013 Darrol Jump OTR/L Pager [phone] Office [phone]   Darrol Jump 01/29/2013, 4:18 PM

## 2013-01-29 NOTE — Progress Notes (Signed)
I discussed with  Dr Adamo.  I agree with their plans documented in their progress note for today.  

## 2013-01-29 NOTE — Progress Notes (Addendum)
[facility] Progress Note  01/29/2013 8:35 AM 1 Day Post-Op  Subjective:  Large bloody spots on bed-pt states he pulled out his IV-not bleeding from surgery site  Tm 99.5 now 99.1  Filed Vitals:   01/29/13 0535  BP: 129/63  Pulse: 77  Temp: 99.1 F (37.3 C)  Resp: 16    Physical Exam: Incisions:  Toe amputation sites clean; minimal bloody drainage.   CBC    Component Value Date/Time   WBC 6.1 01/29/2013 0440   RBC 2.91* 01/29/2013 0440   HGB 7.8* 01/29/2013 0440   HCT 23.8* 01/29/2013 0440   PLT 168 01/29/2013 0440   MCV 81.8 01/29/2013 0440   MCH 26.8 01/29/2013 0440   MCHC 32.8 01/29/2013 0440   RDW 13.8 01/29/2013 0440   LYMPHSABS 1.2 01/25/2013 1700   MONOABS 1.1* 01/25/2013 1700   EOSABS 0.1 01/25/2013 1700   BASOSABS 0.0 01/25/2013 1700    BMET    Component Value Date/Time   NA 135 01/29/2013 0440   K 3.8 01/29/2013 0440   CL 102 01/29/2013 0440   CO2 21 01/29/2013 0440   GLUCOSE 296* 01/29/2013 0440   BUN 11 01/29/2013 0440   CREATININE 0.88 01/29/2013 0440   CREATININE 0.98 11/25/2012 1546   CALCIUM 8.5 01/29/2013 0440   GFRNONAA >90 01/29/2013 0440   GFRAA >90 01/29/2013 0440    INR No results found for this basename: inr     Intake/Output Summary (Last 24 hours) at 01/29/13 0835 Last data filed at 01/29/13 0600  Gross per 24 hour  Intake 3927.5 ml  Output   1210 ml  Net 2717.5 ml   Anaerobic Cx wound: RARE WBC PRESENT, PREDOMINANTLY PMN NO SQUAMOUS EPITHELIAL CELLS SEEN NO ORGANISMS SEEN  Intraoperative wound cx: RARE WBC PRESENT, PREDOMINANTLY PMN NO SQUAMOUS EPITHELIAL CELLS SEEN NO ORGANISMS SEEN Performed at [facility]  Culture  NO GROWTH 1 DAY       Assessment:  62 y.o. 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 -continue ABx -heparin 5000 U SQ for DVT prophylaxis d/c'd 12/25.  Okay to restart from vascular surgery standpoint.    Leontine Locket, PA-C [facility] [phone] 01/29/2013 8:35 AM  Agree with above. Resting comfortably.  F/U culture tomorrow.   Deitra Mayo, MD, [place] [phone] 01/29/2013

## 2013-01-29 NOTE — Evaluation (Signed)
Physical Therapy Evaluation Patient Details Name: Leonard [name] MRN: [id number] DOB: 09-Sep-1950 Today's Date: 01/29/2013 Time: [id number] PT Time Calculation (min): 10 min  PT Assessment / Plan / Recommendation History of Present Illness  Leonard [name] is a 62 y.o. male presenting with increased draining, odor, warmth from his fifth digit on his right foot wound. PMH is significant for uncontrolled DM, HTN, PAD, and dry gangrene of right fifth digit.  Patient s/p amputation rt. 4th and 5th toes.  Clinical Impression  Patient presents with problems listed below.  Will benefit from acute PT to maximize independence prior to discharge home with sister.  Patient with decreased safety awareness.  Will benefit from HHPT to continue therapy at discharge.    PT Assessment  Patient needs continued PT services    Follow Up Recommendations  Home health PT;Supervision/Assistance - 24 hour    Does the patient have the potential to tolerate intense rehabilitation      Barriers to Discharge        Equipment Recommendations  Rolling walker with 5" wheels    Recommendations for Other Services     Frequency Min 4X/week    Precautions / Restrictions Precautions Precautions: Fall Restrictions Weight Bearing Restrictions: Yes RLE Weight Bearing: Partial weight bearing RLE Partial Weight Bearing Percentage or Pounds: Heel only   Pertinent Vitals/Pain       Mobility  Bed Mobility Bed Mobility: Supine to Sit;Sit to Supine Supine to Sit: 7: Independent;HOB flat Sit to Supine: 7: Independent;HOB flat Transfers Transfers: Sit to Stand;Stand to Sit Sit to Stand: 4: Min guard;From bed Stand to Sit: 4: Min guard;To bed Details for Transfer Assistance: Verbal cues for hand placement, technique, and safe use of RW.  Instructed patient on PWB on heel only for RLE.  Assist for balance/safety. Ambulation/Gait Ambulation/Gait Assistance: 4: Min guard Ambulation Distance (Feet): 94  Feet Assistive device: Rolling walker Ambulation/Gait Assistance Details: Verbal cues for safe use of RW and to maintain PWB heel only for RLE.  Cues to stay inside RW and move at slow safe pace. Gait Pattern: Step-to pattern;Decreased stance time - right;Decreased step length - left;Trunk flexed Gait velocity: Cues to move more slowly for safety General Gait Details: Patient impulsive.  Left RW behind on 2 occasions (getting to bathroom and when close to bed).  Verbal cues to keep RW with him at all times to protect RLE.    Exercises     PT Diagnosis: Difficulty walking;Abnormality of gait;Acute pain  PT Problem List: Decreased balance;Decreased mobility;Decreased knowledge of use of DME;Decreased safety awareness;Decreased knowledge of precautions;Pain PT Treatment Interventions: DME instruction;Gait training;Functional mobility training;Cognitive remediation;Patient/family education     PT Goals(Current goals can be found in the care plan section) Acute Rehab PT Goals Patient Stated Goal: To go home soon PT Goal Formulation: With patient Time For Goal Achievement: 02/05/13 Potential to Achieve Goals: Good  Visit Information  Last PT Received On: 01/29/13 Assistance Needed: +1 History of Present Illness: Leonard [name] is a 62 y.o. male presenting with increased draining, odor, warmth from his fifth digit on his right foot wound. PMH is significant for uncontrolled DM, HTN, PAD, and dry gangrene of right fifth digit.  Patient s/p amputation rt. 4th and 5th toes.       Prior [place] expects to be discharged to:: Private residence Living Arrangements: Other relatives Available Help at Discharge: Family;Available 24 hours/day (sister (who uses a RW)) Type of Home: Apartment Home Access: Level  entry Home Layout: One level Home Equipment: None Prior Function Level of Independence: Independent Communication Communication: No difficulties     Cognition  Cognition Arousal/Alertness: Awake/alert Behavior During Therapy: Impulsive;Restless Overall Cognitive Status: Within Functional Limits for tasks assessed (Decreased safety awareness)    Extremity/Trunk Assessment Upper Extremity Assessment Upper Extremity Assessment: Overall WFL for tasks assessed Lower Extremity Assessment Lower Extremity Assessment: Overall WFL for tasks assessed;RLE deficits/detail RLE Deficits / Details: Amputation rt 4th and 5th toes Cervical / Trunk Assessment Cervical / Trunk Assessment: Normal   Balance    End of Session PT - End of Session Equipment Utilized During Treatment: Gait belt Activity Tolerance: Patient tolerated treatment well Patient left: in bed;with call bell/phone within reach Nurse Communication: Mobility status  GP     Despina Pole 01/29/2013, 12:59 PM Carita Pian. Sanjuana Kava, [place] Pager [phone]

## 2013-01-30 DIAGNOSIS — T8140XA Infection following a procedure, unspecified, initial encounter: Secondary | ICD-10-CM

## 2013-01-30 DIAGNOSIS — B9689 Other specified bacterial agents as the cause of diseases classified elsewhere: Secondary | ICD-10-CM

## 2013-01-30 LAB — GLUCOSE, CAPILLARY
Glucose-Capillary: 350 mg/dL — ABNORMAL HIGH (ref 70–99)
Glucose-Capillary: 160 mg/dL — ABNORMAL HIGH (ref 70–99)

## 2013-01-30 MED ORDER — INSULIN ASPART 100 UNIT/ML ~~LOC~~ SOLN: 0.0000 [IU] | Freq: Every day | SUBCUTANEOUS | Status: DC

## 2013-01-30 MED ORDER — INSULIN ASPART 100 UNIT/ML ~~LOC~~ SOLN
0.0000 [IU] | Freq: Three times a day (TID) | SUBCUTANEOUS | Status: DC
  Administered 2013-01-30: 15 [IU] via SUBCUTANEOUS
  Administered 2013-01-30: 11 [IU] via SUBCUTANEOUS
  Administered 2013-01-31 (×3): 7 [IU] via SUBCUTANEOUS
  Administered 2013-02-01 (×2): 4 [IU] via SUBCUTANEOUS
  Administered 2013-02-01: 7 [IU] via SUBCUTANEOUS

## 2013-01-30 MED ORDER — VANCOMYCIN HCL 10 G IV SOLR
1250.0000 mg | Freq: Two times a day (BID) | INTRAVENOUS | Status: DC
  Administered 2013-01-31 (×2): 1250 mg via INTRAVENOUS
  Filled 2013-01-30 (×2): qty 1250

## 2013-01-30 MED ORDER — INSULIN GLARGINE 100 UNIT/ML ~~LOC~~ SOLN
25.0000 [IU] | Freq: Two times a day (BID) | SUBCUTANEOUS | Status: DC
  Administered 2013-01-30 (×2): 25 [IU] via SUBCUTANEOUS
  Filled 2013-01-30 (×4): qty 0.25

## 2013-01-30 MED ORDER — INSULIN ASPART 100 UNIT/ML ~~LOC~~ SOLN
4.0000 [IU] | Freq: Three times a day (TID) | SUBCUTANEOUS | Status: DC
  Administered 2013-01-30 – 2013-02-01 (×8): 4 [IU] via SUBCUTANEOUS

## 2013-01-30 NOTE — Progress Notes (Signed)
Patient ID: Leonard [name], male   DOB: 23-Oct-1950, 62 y.o.   MRN: [id number]         [facility]    Date of Admission:  01/25/2013   Total days of antibiotics 6        Post-op Day 2         Principal Problem:   Diabetic osteomyelitis Active Problems:   Mixed hyperlipidemia   TOBACCO ABUSE   Diabetes mellitus, type 2   Essential hypertension, benign   Gangrene of foot, right fifth metatarsal with osteomyelitis   Diabetic ulcer of toe, R 1st plantar surface, viable   Acute kidney injury   Normocytic anemia   . amLODipine  10 mg Oral Daily  . atenolol  100 mg Oral Daily  . cloNIDine  0.2 mg Oral BID  . heparin subcutaneous  5,000 Units Subcutaneous Q8H  . insulin aspart  0-20 Units Subcutaneous TID WC  . insulin aspart  0-5 Units Subcutaneous QHS  . insulin aspart  4 Units Subcutaneous TID WC  . insulin glargine  25 Units Subcutaneous BID  . lisinopril  40 mg Oral Daily  . nicotine  14 mg Transdermal Daily  . piperacillin-tazobactam (ZOSYN)  IV  3.375 g Intravenous Q8H  . simvastatin  20 mg Oral q1800  . vancomycin  1,000 mg Intravenous Q12H   Objective: Temp:  [98.4 F (36.9 C)-98.8 F (37.1 C)] 98.4 F (36.9 C) (12/28 0500) Pulse Rate:  [67-73] 67 (12/28 0500) Resp:  [18] 18 (12/28 0500) BP: (136-160)/(60-71) 147/62 mmHg (12/28 0500) SpO2:  [98 %-100 %] 98 % (12/28 0500)   Lab Results Lab Results  Component Value Date   WBC 6.1 01/29/2013   HGB 7.8* 01/29/2013   HCT 23.8* 01/29/2013   MCV 81.8 01/29/2013   PLT 168 01/29/2013    Lab Results  Component Value Date   CREATININE 0.88 01/29/2013   BUN 11 01/29/2013   NA 135 01/29/2013   K 3.8 01/29/2013   CL 102 01/29/2013   CO2 21 01/29/2013    Lab Results  Component Value Date   ALT 10 01/25/2013   AST 11 01/25/2013   ALKPHOS 97 01/25/2013   BILITOT 0.2* 01/25/2013      Microbiology: Recent Results (from the past 240 hour(s))  CULTURE, BLOOD (ROUTINE X 2)     Status:  None   Collection Time    01/25/13  6:15 PM      Result Value Range Status   Specimen Description BLOOD ARM LEFT   Final   Special Requests BOTTLES DRAWN AEROBIC ONLY 5CC   Final   Culture  Setup Time     Final   Value: 01/26/2013 00:52     Performed at [facility]   Culture     Final   Value:        BLOOD CULTURE RECEIVED NO GROWTH TO DATE CULTURE WILL BE HELD FOR 5 DAYS BEFORE ISSUING A FINAL NEGATIVE REPORT     Performed at [facility]   Report Status PENDING   Incomplete  CULTURE, BLOOD (ROUTINE X 2)     Status: None   Collection Time    01/25/13  6:30 PM      Result Value Range Status   Specimen Description BLOOD ARM LEFT   Final   Special Requests BOTTLES DRAWN AEROBIC ONLY 5CC   Final   Culture  Setup Time     Final   Value: 01/26/2013  00:52     Performed at [facility]     Final   Value:        BLOOD CULTURE RECEIVED NO GROWTH TO DATE CULTURE WILL BE HELD FOR 5 DAYS BEFORE ISSUING A FINAL NEGATIVE REPORT     Performed at [facility]   Report Status PENDING   Incomplete  WOUND CULTURE     Status: None   Collection Time    01/28/13  2:19 PM      Result Value Range Status   Specimen Description WOUND RIGHT FOOT   Final   Special Requests NONE SWAB FROM RIGHT 4TH AND 5TH TOE PT ON ZOSYN   Final   Gram Stain     Final   Value: RARE WBC PRESENT, PREDOMINANTLY PMN     NO SQUAMOUS EPITHELIAL CELLS SEEN     NO ORGANISMS SEEN     Performed at [facility]   Culture     Final   Value: FEW GRAM NEGATIVE RODS     Performed at [facility]   Report Status PENDING   Incomplete  ANAEROBIC CULTURE     Status: None   Collection Time    01/28/13  2:19 PM      Result Value Range Status   Specimen Description WOUND RIGHT FOOT   Final   Special Requests NONE SWAB FROM RIGHT 4TH AND 5TH TOE PT ON ZOSYN   Final   Gram Stain     Final   Value: RARE WBC PRESENT, PREDOMINANTLY PMN     NO SQUAMOUS EPITHELIAL CELLS SEEN     NO  ORGANISMS SEEN     Performed at [facility]   Culture     Final   Value: NO ANAEROBES ISOLATED; CULTURE IN PROGRESS FOR 5 DAYS     Performed at [facility]   Report Status PENDING   Incomplete   Assessment: His operative cultures are growing gram-negative rods. I would continue vancomycin and piperacillin tazobactam pending final culture results. I am hopeful that we will be able to craft an oral antibiotic regimen and treat for 10-14 days postoperatively to improve wound healing.  Plan: 1. Continue current antibiotics pending final operative culture results  Michel Bickers, MD [facility] for [place] [phone] pager   [phone] cell 01/30/2013, 12:19 PM

## 2013-01-30 NOTE — Consult Note (Signed)
ANTIBIOTIC CONSULT NOTE - follow up Pharmacy Consult for Vancomycin and Zosyn Indication: diabetic foot ulcer  No Known Allergies  Patient Measurements: Height: 5\' 8"  (172.7 cm) (Simultaneous filing. User may not have seen previous data.) Weight: 184 lb 3.2 oz (83.553 kg) (Simultaneous filing. User may not have seen previous data.) IBW/kg (Calculated) : 68.4   Vital Signs: Temp: 98.4 F (36.9 C) (12/28 0500) Temp src: Oral (12/28 0500) BP: 147/62 mmHg (12/28 0500) Pulse Rate: 67 (12/28 0500) Intake/Output from previous day: 12/27 0701 - 12/28 0700 In: 3099.5 [P.O.:480; I.V.:1169.5; IV Piggyback:1450] Out: 1225 [Urine:1225] Intake/Output from this shift: Total I/O In: 360 [P.O.:360] Out: 300 [Urine:300]  Labs:  Recent Labs  01/28/13 0613 01/29/13 0440  WBC 6.8 6.1  HGB 8.0* 7.8*  PLT 156 168  CREATININE 1.05 0.88   Estimated Creatinine Clearance: 92.9 ml/min (by C-G formula based on Cr of 0.88).  Microbiology: Recent Results (from the past 720 hour(s))  CULTURE, BLOOD (ROUTINE X 2)     Status: None   Collection Time    01/04/13  3:50 PM      Result Value Range Status   Specimen Description BLOOD LEFT ARM   Final   Special Requests BOTTLES DRAWN AEROBIC AND ANAEROBIC 5CC   Final   Culture  Setup Time     Final   Value: 01/04/2013 20:30     Performed at [facility]   Culture     Final   Value: NO GROWTH 5 DAYS     Performed at [facility]   Report Status 01/10/2013 FINAL   Final  CULTURE, BLOOD (ROUTINE X 2)     Status: None   Collection Time    01/04/13  3:55 PM      Result Value Range Status   Specimen Description BLOOD RIGHT ANTECUBITAL   Final   Special Requests BOTTLES DRAWN AEROBIC AND ANAEROBIC 5CC   Final   Culture  Setup Time     Final   Value: 01/04/2013 20:31     Performed at [facility]   Culture     Final   Value: NO GROWTH 5 DAYS     Performed at [facility]   Report Status 01/10/2013 FINAL   Final   MRSA PCR SCREENING     Status: None   Collection Time    01/05/13 12:50 AM      Result Value Range Status   MRSA by PCR NEGATIVE  NEGATIVE Final   Comment:            The GeneXpert MRSA Assay (FDA     approved for NASAL specimens     only), is one component of a     comprehensive MRSA colonization     surveillance program. It is not     intended to diagnose MRSA     infection nor to guide or     monitor treatment for     MRSA infections.  CULTURE, BLOOD (ROUTINE X 2)     Status: None   Collection Time    01/25/13  6:15 PM      Result Value Range Status   Specimen Description BLOOD ARM LEFT   Final   Special Requests BOTTLES DRAWN AEROBIC ONLY 5CC   Final   Culture  Setup Time     Final   Value: 01/26/2013 00:52     Performed at [facility]   Culture     Final  Value:        BLOOD CULTURE RECEIVED NO GROWTH TO DATE CULTURE WILL BE HELD FOR 5 DAYS BEFORE ISSUING A FINAL NEGATIVE REPORT     Performed at [facility]   Report Status PENDING   Incomplete  CULTURE, BLOOD (ROUTINE X 2)     Status: None   Collection Time    01/25/13  6:30 PM      Result Value Range Status   Specimen Description BLOOD ARM LEFT   Final   Special Requests BOTTLES DRAWN AEROBIC ONLY 5CC   Final   Culture  Setup Time     Final   Value: 01/26/2013 00:52     Performed at [facility]   Culture     Final   Value:        BLOOD CULTURE RECEIVED NO GROWTH TO DATE CULTURE WILL BE HELD FOR 5 DAYS BEFORE ISSUING A FINAL NEGATIVE REPORT     Performed at [facility]   Report Status PENDING   Incomplete  WOUND CULTURE     Status: None   Collection Time    01/28/13  2:19 PM      Result Value Range Status   Specimen Description WOUND RIGHT FOOT   Final   Special Requests NONE SWAB FROM RIGHT 4TH AND 5TH TOE PT ON ZOSYN   Final   Gram Stain     Final   Value: RARE WBC PRESENT, PREDOMINANTLY PMN     NO SQUAMOUS EPITHELIAL CELLS SEEN     NO ORGANISMS SEEN     Performed at  [facility]   Culture     Final   Value: FEW GRAM NEGATIVE RODS     Performed at [facility]   Report Status PENDING   Incomplete  ANAEROBIC CULTURE     Status: None   Collection Time    01/28/13  2:19 PM      Result Value Range Status   Specimen Description WOUND RIGHT FOOT   Final   Special Requests NONE SWAB FROM RIGHT 4TH AND 5TH TOE PT ON ZOSYN   Final   Gram Stain     Final   Value: RARE WBC PRESENT, PREDOMINANTLY PMN     NO SQUAMOUS EPITHELIAL CELLS SEEN     NO ORGANISMS SEEN     Performed at [facility]   Culture     Final   Value: NO ANAEROBES ISOLATED; CULTURE IN PROGRESS FOR 5 DAYS     Performed at [facility]   Report Status PENDING   Incomplete    Medical History: Past Medical History  Diagnosis Date  . Hypertension   . PAD (peripheral artery disease)   . Gangrene of toe     right 5th/notes 01/04/2013   . High cholesterol   . Type II diabetes mellitus    Assessment:  Vanc/Zosyn D#6 for right 5th toe gangrene s/p arteriogram 12/11 and amputation 12/26.  12/26 foot wound growing GNRs.  WBC 6.1 on 12/17, Afebrile. Vancomycin trough = 9.5 mcg/ml on vanc 1 gm q12h. ID plans to craft an oral antibiotic regimen to treat for 10 - 14 days postoperatively to improve wound healing.   12/26 R foot wound GNR 12/23 blood x2 NGTD   Goal of Therapy: vancomycin trough level 15-25 mcg/ml  Plan:  1) increase vancomycin to 1250mg  IV q12 2) continue Zosyn 3.375g IV q8 (4 hour infusion) 3) Follow plans with ID consult Sharyn Lull  Nicanor Alcon, Pharm.D. [id number] 01/30/2013 2:15 PM

## 2013-01-30 NOTE — Progress Notes (Signed)
Physical Therapy Treatment Patient Details Name: SHAJUAN DONE MRN: [id number] DOB: 06-Jul-1950 Today's Date: 01/30/2013 Time: [id number] PT Time Calculation (min): 10 min  PT Assessment / Plan / Recommendation  History of Present Illness DANNIE BARRESI is a 62 y.o. male presenting with increased draining, odor, warmth from his fifth digit on his right foot wound. PMH is significant for uncontrolled DM, HTN, PAD, and dry gangrene of right fifth digit.  Patient s/p amputation rt. 4th and 5th toes.   PT Comments   Patient making progress with mobility.  Continues to need reminders for safety.  Very impulsive.  Follow Up Recommendations  Home health PT;Supervision/Assistance - 24 hour     Does the patient have the potential to tolerate intense rehabilitation     Barriers to Discharge        Equipment Recommendations  Rolling walker with 5" wheels    Recommendations for Other Services    Frequency Min 4X/week   Progress towards PT Goals Progress towards PT goals: Progressing toward goals  Plan Current plan remains appropriate    Precautions / Restrictions Precautions Precautions: Fall Precaution Comments: Patient impulsive - decreased safety awareness Restrictions Weight Bearing Restrictions: Yes RLE Weight Bearing: Partial weight bearing RLE Partial Weight Bearing Percentage or Pounds: Heel only   Pertinent Vitals/Pain     Mobility  Bed Mobility Bed Mobility: Supine to Sit;Sit to Supine Supine to Sit: 7: Independent Sitting - Scoot to Edge of Bed: 7: Independent Transfers Transfers: Sit to Stand;Stand to Sit Sit to Stand: 4: Min guard;From bed Stand to Sit: 4: Min guard;To bed Details for Transfer Assistance: Verbal cues for hand placement, technique, and safe use of RW.  Instructed patient on PWB on heel only for RLE.  Assist for balance/safety.  Cues to move more slowly for safety. Ambulation/Gait Ambulation/Gait Assistance: 4: Min guard Ambulation Distance  (Feet): 120 Feet Assistive device: Rolling walker Ambulation/Gait Assistance Details: Verbal cues focusing on safety - stay close to RW and move at safe pace.   Gait Pattern: Step-to pattern;Decreased stance time - right;Decreased step length - left;Trunk flexed Gait velocity: Cues to move more slowly for safety      PT Goals (current goals can now be found in the care plan section)    Visit Information  Last PT Received On: 01/30/13 Assistance Needed: +1 History of Present Illness: DIOR PALOMAREZ is a 62 y.o. male presenting with increased draining, odor, warmth from his fifth digit on his right foot wound. PMH is significant for uncontrolled DM, HTN, PAD, and dry gangrene of right fifth digit.  Patient s/p amputation rt. 4th and 5th toes.    Subjective Data  Subjective: "I've been in bed all day"   Cognition  Cognition Arousal/Alertness: Awake/alert Behavior During Therapy: Impulsive Overall Cognitive Status: Impaired/Different from baseline Area of Impairment: Safety/judgement Safety/Judgement: Decreased awareness of safety    Balance     End of Session PT - End of Session Equipment Utilized During Treatment: Gait belt Activity Tolerance: Patient tolerated treatment well Patient left: in bed;with call bell/phone within reach;with bed alarm set Nurse Communication: Mobility status   GP     Despina Pole 01/30/2013, 5:45 PM Carita Pian. Sanjuana Kava, [place] Pager [phone]

## 2013-01-30 NOTE — Progress Notes (Signed)
FMTS Attending Daily Note: Jiovany Scheffel MD [phone] pager office [phone] I  have seen and examined this patient, reviewed their chart. I have discussed this patient with the resident. I agree with the resident's findings, assessment and care plan. 

## 2013-01-30 NOTE — Progress Notes (Addendum)
[facility] Progress Note 01/30/2013 9:10 AM 2 Days Post-Op  Subjective:  Sleeping soundly.  No complaints; states his dressing was just changed  Afebrile x 24 hrs  Filed Vitals:   01/30/13 0500  BP: 147/62  Pulse: 67  Temp: 98.4 F (36.9 C)  Resp: 18   Physical Exam: Incisions:  Wound is clean with good granulation tissue  CBC    Component Value Date/Time   WBC 6.1 01/29/2013 0440   RBC 2.91* 01/29/2013 0440   HGB 7.8* 01/29/2013 0440   HCT 23.8* 01/29/2013 0440   PLT 168 01/29/2013 0440   MCV 81.8 01/29/2013 0440   MCH 26.8 01/29/2013 0440   MCHC 32.8 01/29/2013 0440   RDW 13.8 01/29/2013 0440   LYMPHSABS 1.2 01/25/2013 1700   MONOABS 1.1* 01/25/2013 1700   EOSABS 0.1 01/25/2013 1700   BASOSABS 0.0 01/25/2013 1700   BMET    Component Value Date/Time   NA 135 01/29/2013 0440   K 3.8 01/29/2013 0440   CL 102 01/29/2013 0440   CO2 21 01/29/2013 0440   GLUCOSE 296* 01/29/2013 0440   BUN 11 01/29/2013 0440   CREATININE 0.88 01/29/2013 0440   CREATININE 0.98 11/25/2012 1546   CALCIUM 8.5 01/29/2013 0440   GFRNONAA >90 01/29/2013 0440   GFRAA >90 01/29/2013 0440   Wound Cx:  FEW GRAM NEG RODS Anaerobic Cx:NO ANAEROBES ISOLATED; CULTURE IN PROGRESS FOR 5 DAYS  Assessment:  62 y.o. male is s/p:  Open amputation of right fourth and fifth toe   2 Days Post-Op  Plan: -continue wet to dry dressing changes for today-if wound continues to look good tomorrow, will order wound vac. -intra op wound cx now with few GNR-ABx per ID -DVT prophylaxis:  Heparin SQ  Leontine Locket, PA-C [facility] [phone] 01/30/2013 9:10 AM  Agree with above. I inspected the toe amputation sites today. The wound is clean and appears to have excellent perfusion. Given the extent of the wound however, I think he will require placement of a VAC. He continues Vanco and Zosyn per ID.   Deitra Mayo, MD, [place]  [phone] 01/30/2013

## 2013-01-30 NOTE — Progress Notes (Signed)
Family Medicine Teaching Service Daily Progress Note Intern Pager: [phone]  [name] Medical record number: [id number] Date of birth: 1950-08-15 Age: 62 y.o. Gender: male  Primary Care Provider: Rosemarie Ax, MD Consultants: vascular Code Status: full  Pt Overview and Major Events to Date:  12/23 - Admitted with gangrainous toe 12/26 - Rt 4th/5th digits amputated  Assessment and Plan: Leonard [name] is a 62 y.o. male presenting with increased draining, odor, warmth from his fifth digit on his right foot wound. PMH is significant for uncontrolled DM, HTN, PAD, and dry gangrene of right fifth digit.   #Gangrene/osteomyelitis of right foot -  Followed by [facility] ([name]ickson) for diabetic wound. Falsely elevated ABI of 86%; multiple previous revascularization procedures on 01/13/13 due to right SFA and severe tibial artery disease  - POD 2 s/p 4/5th digit amputation 12/26.  - Continue Vanc/Zosyn (day 6) for 14 days per Dr. Megan Salon, infectious disease  # Diabetes - A1C 11.9 on 01/13/13 and pt with history of non adherence  - CBG > 300, restart home Lantus at 50 units daily with resistant SSI, meal and QHS coverage  # HTN - history of high BP (190s) when non adherent, SBP 140s here  - Cont amlodipine, atenolol, clonidine, and lisinopril, holding triamterene/HCTZ   # HLD  - Cont statin   #Social: family would like an in home aid to come help deliver his medications and monitoring of his chronic diseases  - PT/OT consulted, f/u recommendations - need rolling walker with 5" wheels - consider c/s to care management for possible home health   FEN/GI: NPO, NS KVO Prophylaxis: Lovenox SQ daily   Disposition: pending surgery and clinical improvement  Subjective: Feeling fine, denies pain  Objective: Temp:  [98.6 F (37 C)-99.1 F (37.3 C)] 98.6 F (37 C) (12/27 2100) Pulse Rate:  [69-77] 73 (12/27 2100) Resp:  [16-18] 18 (12/27  2100) BP: (129-160)/(60-71) 160/71 mmHg (12/27 2100) SpO2:  [98 %-100 %] 100 % (12/27 2100) Physical Exam: General: NAD, well nourished  HEENT: NCAT, MMM Cardiovascular: S1S2, RRR, no murmurs, rubs or gallops  Respiratory: clear breath sounds bilaterally, no wheezes, crackles or rales  Abdomen: soft, non-tender, non-distended.  Extremities: dressing in place, no drainage, no surrounding swelling or erythema of right foot  Laboratory:  Recent Labs Lab 01/25/13 1700 01/28/13 0613 01/29/13 0440  WBC 8.3 6.8 6.1  HGB 9.0* 8.0* 7.8*  HCT 27.2* 25.1* 23.8*  PLT 177 156 168    Recent Labs Lab 01/25/13 1700 01/27/13 0906 01/28/13 0613 01/29/13 0440  NA 133* 133* 136 135  K 5.0 4.0 4.1 3.8  CL 99 100 104 102  CO2 20 20 21 21   BUN 45* 19 14 11   CREATININE 1.59* 0.98 1.05 0.88  CALCIUM 9.5 8.9 8.5 8.5  PROT 7.5  --   --   --   BILITOT 0.2*  --   --   --   ALKPHOS 97  --   --   --   ALT 10  --   --   --   AST 11  --   --   --   GLUCOSE 231* 291* 316* 296*   Imaging/Diagnostic Tests: MR foot: Osteomyelitis involving entire shaft of the 5th metatarsal and the phalangeal bones of the little toe. The base of the 5th metatarsal is not involved.  Angelica Ran, MD 01/30/2013, 3:09 AM PGY-3, [place] Intern pager: [phone], text pages  welcome

## 2013-01-31 ENCOUNTER — Encounter (HOSPITAL_COMMUNITY): Payer: Self-pay | Admitting: Vascular Surgery

## 2013-01-31 DIAGNOSIS — L0291 Cutaneous abscess, unspecified: Secondary | ICD-10-CM

## 2013-01-31 DIAGNOSIS — A498 Other bacterial infections of unspecified site: Secondary | ICD-10-CM

## 2013-01-31 LAB — WOUND CULTURE

## 2013-01-31 LAB — GLUCOSE, CAPILLARY
Glucose-Capillary: 204 mg/dL — ABNORMAL HIGH (ref 70–99)
Glucose-Capillary: 227 mg/dL — ABNORMAL HIGH (ref 70–99)
Glucose-Capillary: 194 mg/dL — ABNORMAL HIGH (ref 70–99)

## 2013-01-31 MED ORDER — SULFAMETHOXAZOLE-TMP DS 800-160 MG PO TABS
1.0000 | ORAL_TABLET | Freq: Two times a day (BID) | ORAL | Status: DC
  Administered 2013-01-31 – 2013-02-01 (×4): 1 via ORAL
  Filled 2013-01-31 (×4): qty 1

## 2013-01-31 MED ORDER — INSULIN GLARGINE 100 UNIT/ML ~~LOC~~ SOLN
28.0000 [IU] | Freq: Two times a day (BID) | SUBCUTANEOUS | Status: DC
  Administered 2013-01-31: 28 [IU] via SUBCUTANEOUS
  Filled 2013-01-31 (×2): qty 0.28

## 2013-01-31 MED ORDER — INSULIN GLARGINE 100 UNIT/ML ~~LOC~~ SOLN
30.0000 [IU] | Freq: Two times a day (BID) | SUBCUTANEOUS | Status: DC
  Administered 2013-01-31 – 2013-02-01 (×2): 30 [IU] via SUBCUTANEOUS
  Filled 2013-01-31 (×3): qty 0.3

## 2013-01-31 NOTE — Care Management Note (Signed)
  Page 2 of 2   02/01/2013     1:21:29 PM   CARE MANAGEMENT NOTE 02/01/2013  Patient:  Leonard [name], Leonard [name]   Account Number:  [id number]  Date Initiated:  01/31/2013  Documentation initiated by:  Magdalen Spatz  Subjective/Objective Assessment:     Action/Plan:   Anticipated DC Date:  02/01/2013   Anticipated DC Plan:  [place]         Choice offered to / List presented to:  C-1 Patient   DME arranged  Vassie Moselle      DME agency  [place] arranged  [place] RN      [place].   Status of service:  In process, will continue to follow Medicare Important Message given?   (If response is "NO", the following Medicare IM given date fields will be blank) Date Medicare IM given:   Date Additional Medicare IM given:    Discharge Disposition:    Per UR Regulation:  Reviewed for med. necessity/level of care/duration of stay  If discussed at [place] of Stay Meetings, dates discussed:    Comments:  02-01-13 Brad from [facility] called VAC will be delivered today by 1700. Paged [phone] to make MD aware . [facility] aware. Magdalen Spatz RN BSN    02-01-13 Vac papers signed at 1200, faxed to [facility] who is aware patient is for discharge today . Magdalen Spatz BSN RN [phone]   01-31-13 WOC placed wound VAC . Need [employer] form and [place] [facility] Request for Prior approval signed by MD who is registered with [place] .  Left messages for Dr Gwendlyn Deutscher and Dr Donnetta Hutching .  Dr Chrissie Noa prefers Dr Early sign the forms.  Also need order for home health RN to change [facility] dressing Monday , Wednesday , Friday  Thanks  Magdalen Spatz RN BSN [phone]

## 2013-01-31 NOTE — Consult Note (Signed)
WOC wound consult note Reason for Consult: Consult requested for right foot wound by VVS service for vac application. Wound type: Full thickness post-op wound to right outer foot. Measurement: 7X4X3cm with tunneling at 11:00 o'clock to additional 3 cm Wound bed:100% beefy red Drainage (amount, consistency, odor) No odor, small amt pink  drainage Periwound: Intact skin surrounding wound Dressing procedure/placement/frequency: Applied one piece of black foam to 170mm cont suction.  Pt tolerated with minimal amt discomfort.  Bridged track pad to anterior foot.  Plan for bedside nurses to change Q M/W/F. Please re-consult if further assistance is needed.  Thank-you,  Julien Girt MSN, [place], [place], [place], [place]

## 2013-01-31 NOTE — Progress Notes (Signed)
Physical Therapy Treatment Patient Details Name: Leonard [name] MRN: [id number] DOB: October 09, 1950 Today's Date: 01/31/2013 Time: 1345-1401 PT Time Calculation (min): 16 min  PT Assessment / Plan / Recommendation  History of Present Illness Leonard [name] is a 62 y.o. male presenting with increased draining, odor, warmth from his fifth digit on his right foot wound. PMH is significant for uncontrolled DM, HTN, PAD, and dry gangrene of right fifth digit.  Patient s/p amputation rt. 4th and 5th toes.   PT Comments   Patient has met PT goals. Encouraged to ambulate with staff and educated on safety awareness. Will signoff from acute PT needs  Follow Up Recommendations  Home health PT;Supervision/Assistance - 24 hour     Does the patient have the potential to tolerate intense rehabilitation     Barriers to Discharge        Equipment Recommendations  Rolling walker with 5" wheels    Recommendations for Other Services    Frequency Min 4X/week   Progress towards PT Goals Progress towards PT goals: Goals met/education completed, patient discharged from PT  Plan Current plan remains appropriate    Precautions / Restrictions Precautions Precautions: Fall Precaution Comments: Patient impulsive - decreased safety awareness Restrictions RLE Weight Bearing: Partial weight bearing RLE Partial Weight Bearing Percentage or Pounds: Heel only   Pertinent Vitals/Pain no apparent distress    Mobility  Bed Mobility Supine to Sit: 7: Independent Sitting - Scoot to Edge of Bed: 7: Independent Transfers Sit to Stand: 6: Modified independent (Device/Increase time) Stand to Sit: 6: Modified independent (Device/Increase time) Details for Transfer Assistance: Cues for safety Ambulation/Gait Ambulation/Gait Assistance: 5: Supervision Ambulation Distance (Feet): 300 Feet Assistive device: Rolling walker Ambulation/Gait Assistance Details: Cues for walker postioning and safety Gait Pattern:  Step-to pattern;Decreased stance time - right;Decreased step length - left    Exercises     PT Diagnosis:    PT Problem List:   PT Treatment Interventions:     PT Goals (current goals can now be found in the care plan section)    Visit Information  Last PT Received On: 01/31/13 Assistance Needed: +1 History of Present Illness: Leonard [name] is a 62 y.o. male presenting with increased draining, odor, warmth from his fifth digit on his right foot wound. PMH is significant for uncontrolled DM, HTN, PAD, and dry gangrene of right fifth digit.  Patient s/p amputation rt. 4th and 5th toes.    Subjective Data      Cognition  Cognition Arousal/Alertness: Awake/alert Behavior During Therapy: Impulsive Overall Cognitive Status: Within Functional Limits for tasks assessed Area of Impairment: Safety/judgement Safety/Judgement: Decreased awareness of safety    Balance     End of Session PT - End of Session Equipment Utilized During Treatment: Gait belt Activity Tolerance: Patient tolerated treatment well Patient left: in bed;with call bell/phone within reach;with bed alarm set Nurse Communication: Mobility status   GP     Jacqualyn Herschberger 01/31/2013, 2:25 PM 01/31/2013 Jacqualyn Vivona PTA [phone] pager [phone] office

## 2013-01-31 NOTE — Progress Notes (Signed)
Family Medicine Teaching Service Daily Progress Note Intern Pager: [phone]  Patient name: Leonard Leonard [name] Medical record number: [id number] Date of birth: 06/10/1950 Age: 62 y.o. Gender: male  Primary Care Provider: Rosemarie Ax, MD Consultants: vascular Code Status: full  Pt Overview and Major Events to Date:  12/23 - Admitted with gangrainous toe 12/26 - Rt 4th/5th digits amputated  Assessment and Plan: Leonard Leonard [name] is a 62 y.o. male presenting with increased draining, odor, warmth from his fifth digit on his right foot wound. PMH is significant for uncontrolled DM, HTN, PAD, and dry gangrene of right fifth digit.   #Gangrene/osteomyelitis of right foot -  Followed by Vascular & Vein (Leonard [name]/Chen/Dickson) for diabetic wound. Falsely elevated ABI of 86%; multiple previous revascularization procedures on 01/13/13 due to right SFA and severe tibial artery disease  - POD 3 s/p 4/5th digit amputation 12/26.  - Continue Vanc/Zosyn (day 6) for 14 days per Dr. Megan Salon, infectious disease - Likely transition to oral regimen for d/c, f/u ID recs for this - Wound growing E. coli  # Diabetes - A1C 11.9 on 01/13/13 and pt with history of non adherence  - 25u lantus BID + got 45u resistant SSI coverage - increase lantus to 30u BID  # HTN - history of high BP (190s) when non adherent, SBP 140s here  - Cont amlodipine, atenolol, clonidine, and lisinopril, holding triamterene/HCTZ   # HLD  - Cont statin   #Social: family would like an in home aid to come help deliver his medications and monitoring of his chronic diseases  - PT/OT consulted, f/u recommendations - need rolling walker with 5" wheels - care management consulted for walker and home health   FEN/GI: NPO, NS KVO Prophylaxis: Lovenox SQ daily   Disposition: pending clinical improvement, likely tomorrow if able to transition to po abx today  Subjective: Feeling fine, denies pain  Objective: Temp:  [98.2 F (36.8  C)-98.9 F (37.2 C)] 98.2 F (36.8 C) (12/29 0947) Pulse Rate:  [64-71] 71 (12/29 0947) Resp:  [18] 18 (12/29 0456) BP: (116-145)/(53-68) 116/68 mmHg (12/29 0948) SpO2:  [96 %-100 %] 96 % (12/29 0947) Physical Exam: General: NAD, well nourished  HEENT: NCAT, MMM Cardiovascular: S1S2, RRR, no murmurs, rubs or gallops  Respiratory: clear breath sounds bilaterally, no wheezes, crackles or rales  Abdomen: soft, non-tender, non-distended.  Extremities: dressing in place, no drainage, no surrounding swelling or erythema of right foot  Laboratory:  Recent Labs Lab 01/25/13 1700 01/28/13 0613 01/29/13 0440  WBC 8.3 6.8 6.1  HGB 9.0* 8.0* 7.8*  HCT 27.2* 25.1* 23.8*  PLT 177 156 168    Recent Labs Lab 01/25/13 1700 01/27/13 0906 01/28/13 0613 01/29/13 0440  NA 133* 133* 136 135  K 5.0 4.0 4.1 3.8  CL 99 100 104 102  CO2 20 20 21 21   BUN 45* 19 14 11   CREATININE 1.59* 0.98 1.05 0.88  CALCIUM 9.5 8.9 8.5 8.5  PROT 7.5  --   --   --   BILITOT 0.2*  --   --   --   ALKPHOS 97  --   --   --   ALT 10  --   --   --   AST 11  --   --   --   GLUCOSE 231* 291* 316* 296*   Imaging/Diagnostic Tests: MR foot: Osteomyelitis involving entire shaft of the 5th metatarsal and the phalangeal bones of the little toe. The base of the 5th  metatarsal is not involved.  Beverlyn Roux, MD 01/31/2013, 12:35 PM PGY-1, [place] Intern pager: [phone], text pages welcome

## 2013-01-31 NOTE — Progress Notes (Signed)
FMTS Attending  Note: Leonard Tuite,MD I  have seen and examined this patient, reviewed their chart. I have discussed this patient with the resident. I agree with the resident's findings, assessment and care plan.  

## 2013-01-31 NOTE — Progress Notes (Signed)
[place] for Infectious Disease  Date of Admission:  01/25/2013  Antibiotics: Antibiotics Given (last 72 hours)   Date/Time Action Medication Dose Rate   01/28/13 1616 Given   piperacillin-tazobactam (ZOSYN) IVPB 3.375 g 3.375 g 12.5 mL/hr   01/29/13 0146 Given   vancomycin (VANCOCIN) IVPB 1000 mg/200 mL premix 1,000 mg 200 mL/hr   01/29/13 0319 Given   piperacillin-tazobactam (ZOSYN) IVPB 3.375 g 3.375 g 12.5 mL/hr   01/29/13 1200 Given   piperacillin-tazobactam (ZOSYN) IVPB 3.375 g 3.375 g 12.5 mL/hr   01/29/13 1234 Given   vancomycin (VANCOCIN) IVPB 1000 mg/200 mL premix 1,000 mg 200 mL/hr   01/29/13 1651 Given   piperacillin-tazobactam (ZOSYN) IVPB 3.375 g 3.375 g 12.5 mL/hr   01/30/13 0129 Given   vancomycin (VANCOCIN) IVPB 1000 mg/200 mL premix 1,000 mg 200 mL/hr   01/30/13 0350 Given   piperacillin-tazobactam (ZOSYN) IVPB 3.375 g 3.375 g 12.5 mL/hr   01/30/13 1041 Given   piperacillin-tazobactam (ZOSYN) IVPB 3.375 g 3.375 g 12.5 mL/hr   01/30/13 1328 Given   vancomycin (VANCOCIN) IVPB 1000 mg/200 mL premix 1,000 mg 200 mL/hr   01/30/13 1645 Given   piperacillin-tazobactam (ZOSYN) IVPB 3.375 g 3.375 g 12.5 mL/hr   01/31/13 0009 Given   vancomycin (VANCOCIN) 1,250 mg in sodium chloride 0.9 % 250 mL IVPB 1,250 mg 166.7 mL/hr   01/31/13 0214 Given   piperacillin-tazobactam (ZOSYN) IVPB 3.375 g 3.375 g 12.5 mL/hr   01/31/13 1021 Given   piperacillin-tazobactam (ZOSYN) IVPB 3.375 g 3.375 g 12.5 mL/hr   01/31/13 1235 Given   vancomycin (VANCOCIN) 1,250 mg in sodium chloride 0.9 % 250 mL IVPB 1,250 mg 166.7 mL/hr      Subjective: No complaints, no pain, no fever, no chills  Objective: Temp:  [98.2 F (36.8 C)-98.9 F (37.2 C)] 98.2 F (36.8 C) (12/29 0947) Pulse Rate:  [64-71] 71 (12/29 0947) Resp:  [18] 18 (12/29 0456) BP: (116-145)/(53-68) 116/68 mmHg (12/29 0948) SpO2:  [96 %-100 %] 96 % (12/29 0947)  General: Awake, alert,nad Skin: no rashes Lungs:  CTA B Cor: RRR Abdomen: soft, nt, nd   Lab Results Lab Results  Component Value Date   WBC 6.1 01/29/2013   HGB 7.8* 01/29/2013   HCT 23.8* 01/29/2013   MCV 81.8 01/29/2013   PLT 168 01/29/2013    Lab Results  Component Value Date   CREATININE 0.88 01/29/2013   BUN 11 01/29/2013   NA 135 01/29/2013   K 3.8 01/29/2013   CL 102 01/29/2013   CO2 21 01/29/2013    Lab Results  Component Value Date   ALT 10 01/25/2013   AST 11 01/25/2013   ALKPHOS 97 01/25/2013   BILITOT 0.2* 01/25/2013      Microbiology: Recent Results (from the past 240 hour(s))  CULTURE, BLOOD (ROUTINE X 2)     Status: None   Collection Time    01/25/13  6:15 PM      Result Value Range Status   Specimen Description BLOOD ARM LEFT   Final   Special Requests BOTTLES DRAWN AEROBIC ONLY 5CC   Final   Culture  Setup Time     Final   Value: 01/26/2013 00:52     Performed at [facility]   Culture     Final   Value:        BLOOD CULTURE RECEIVED NO GROWTH TO DATE CULTURE WILL BE HELD FOR 5 DAYS BEFORE ISSUING A FINAL NEGATIVE REPORT  Performed at [facility]   Report Status PENDING   Incomplete  CULTURE, BLOOD (ROUTINE X 2)     Status: None   Collection Time    01/25/13  6:30 PM      Result Value Range Status   Specimen Description BLOOD ARM LEFT   Final   Special Requests BOTTLES DRAWN AEROBIC ONLY 5CC   Final   Culture  Setup Time     Final   Value: 01/26/2013 00:52     Performed at [facility]   Culture     Final   Value:        BLOOD CULTURE RECEIVED NO GROWTH TO DATE CULTURE WILL BE HELD FOR 5 DAYS BEFORE ISSUING A FINAL NEGATIVE REPORT     Performed at [facility]   Report Status PENDING   Incomplete  WOUND CULTURE     Status: None   Collection Time    01/28/13  2:19 PM      Result Value Range Status   Specimen Description WOUND RIGHT FOOT   Final   Special Requests NONE SWAB FROM RIGHT 4TH AND 5TH TOE PT ON ZOSYN   Final   Gram Stain     Final    Value: RARE WBC PRESENT, PREDOMINANTLY PMN     NO SQUAMOUS EPITHELIAL CELLS SEEN     NO ORGANISMS SEEN     Performed at [facility]   Culture     Final   Value: FEW ESCHERICHIA COLI     Performed at [facility]   Report Status 01/31/2013 FINAL   Final   Organism ID, Bacteria ESCHERICHIA COLI   Final  ANAEROBIC CULTURE     Status: None   Collection Time    01/28/13  2:19 PM      Result Value Range Status   Specimen Description WOUND RIGHT FOOT   Final   Special Requests NONE SWAB FROM RIGHT 4TH AND 5TH TOE PT ON ZOSYN   Final   Gram Stain     Final   Value: RARE WBC PRESENT, PREDOMINANTLY PMN     NO SQUAMOUS EPITHELIAL CELLS SEEN     NO ORGANISMS SEEN     Performed at [facility]   Culture     Final   Value: NO ANAEROBES ISOLATED; CULTURE IN PROGRESS FOR 5 DAYS     Performed at [facility]   Report Status PENDING   Incomplete    Studies/Results: No results found.  Assessment/Plan: 1) osteomyelitis with abscess - culture with E coli sensitive to cipro and bactrim.  Infected bone has been removed.  I have stopped iv antibiotics and started Bactrim DS 1 tab twice a day for 10 more days.   -we will arrange follow up with [place] in [facility] in about 10 days (Dr. Megan Salon).   Thanks for consult  Scharlene Gloss, [place] for Infectious Disease [place] [email or web address] [phone] pager   [phone] cell 01/31/2013, 1:37 PM

## 2013-02-01 ENCOUNTER — Encounter: Payer: Medicaid Other | Admitting: Vascular Surgery

## 2013-02-01 LAB — CULTURE, BLOOD (ROUTINE X 2)
Culture: NO GROWTH
Culture: NO GROWTH

## 2013-02-01 LAB — CBC
HCT: 24.7 % — ABNORMAL LOW (ref 39.0–52.0)
Hemoglobin: 8 g/dL — ABNORMAL LOW (ref 13.0–17.0)
MCH: 26.8 pg (ref 26.0–34.0)
Platelets: 174 10*3/uL (ref 150–400)
RBC: 2.99 MIL/uL — ABNORMAL LOW (ref 4.22–5.81)
WBC: 7.3 10*3/uL (ref 4.0–10.5)

## 2013-02-01 LAB — C-REACTIVE PROTEIN: CRP: 6.7 mg/dL — ABNORMAL HIGH (ref <0.60)

## 2013-02-01 LAB — BASIC METABOLIC PANEL
CO2: 25 mEq/L (ref 19–32)
Chloride: 103 mEq/L (ref 96–112)
GFR calc Af Amer: >90 mL/min (ref >90)
Glucose, Bld: 200 mg/dL — ABNORMAL HIGH (ref 70–99)
Potassium: 3.9 mEq/L (ref 3.7–5.3)
Sodium: 141 mEq/L (ref 137–147)

## 2013-02-01 LAB — GLUCOSE, CAPILLARY
Glucose-Capillary: 166 mg/dL — ABNORMAL HIGH (ref 70–99)
Glucose-Capillary: 180 mg/dL — ABNORMAL HIGH (ref 70–99)

## 2013-02-01 MED ORDER — POLYETHYLENE GLYCOL 3350 17 G PO PACK: 17.0000 g | PACK | Freq: Every day | ORAL | Status: DC

## 2013-02-01 MED ORDER — ACETAMINOPHEN 325 MG PO TABS: 650.0000 mg | ORAL_TABLET | Freq: Four times a day (QID) | ORAL | Status: DC | PRN

## 2013-02-01 MED ORDER — INSULIN GLARGINE 100 UNITS/ML SOLOSTAR PEN: 30.0000 [IU] | PEN_INJECTOR | Freq: Two times a day (BID) | SUBCUTANEOUS | Status: DC

## 2013-02-01 MED ORDER — SULFAMETHOXAZOLE-TMP DS 800-160 MG PO TABS: 1.0000 | ORAL_TABLET | Freq: Two times a day (BID) | ORAL | Status: DC

## 2013-02-01 MED ORDER — OXYCODONE HCL 5 MG PO TABS: 5.0000 mg | ORAL_TABLET | ORAL | Status: DC | PRN

## 2013-02-01 MED ORDER — POLYETHYLENE GLYCOL 3350 17 G PO PACK
17.0000 g | PACK | Freq: Every day | ORAL | Status: DC
  Administered 2013-02-01: 17 g via ORAL
  Filled 2013-02-01: qty 1

## 2013-02-01 NOTE — Progress Notes (Signed)
Post op shoe applied per ortho tech. Printed AVS, prescription, walker, and VAC supplies given to patient. Patient discharged home vai wheelchair.

## 2013-02-01 NOTE — Progress Notes (Signed)
Discharge instructions reviewed with patient. Nephew present. Questions answered re: pain management, antibiotic doses and changes in insulin. Reviewed plan of care for [facility] management. [place] RN to see patient on day after discharge. Home VAC machine applied, without difficulty. Patient also has question re: protective shoe for affected foot. No orders noted. Dr. Mitzi Hansen paged.

## 2013-02-01 NOTE — Progress Notes (Signed)
Orthopedic Tech Progress Note Patient Details:  Leonard [name] Nov 03, 1950 [id number]  Ortho Devices Type of Ortho Device: Postop shoe/boot Ortho Device/Splint Location: RLE Ortho Device/Splint Interventions: Ordered;Application   Braulio Bosch 02/01/2013, 7:28 PM

## 2013-02-01 NOTE — Progress Notes (Addendum)
[facility] Progress Note  02/01/2013 11:42 AM 4 Days Post-Op  Subjective:  No complaints; bathing.  RN states he is tolerating vac fine.    Filed Vitals:   02/01/13 1005  BP: 127/53  Pulse: 66  Temp:   Resp:     Physical Exam: Incisions:  Not inspected Extremities:  Not inspected.  CBC    Component Value Date/Time   WBC 7.3 02/01/2013 0750   RBC 2.99* 02/01/2013 0750   HGB 8.0* 02/01/2013 0750   HCT 24.7* 02/01/2013 0750   PLT 174 02/01/2013 0750   MCV 82.6 02/01/2013 0750   MCH 26.8 02/01/2013 0750   MCHC 32.4 02/01/2013 0750   RDW 13.8 02/01/2013 0750   LYMPHSABS 1.2 01/25/2013 1700   MONOABS 1.1* 01/25/2013 1700   EOSABS 0.1 01/25/2013 1700   BASOSABS 0.0 01/25/2013 1700    BMET    Component Value Date/Time   NA 141 02/01/2013 0750   K 3.9 02/01/2013 0750   CL 103 02/01/2013 0750   CO2 25 02/01/2013 0750   GLUCOSE 200* 02/01/2013 0750   BUN 10 02/01/2013 0750   CREATININE 0.99 02/01/2013 0750   CREATININE 0.98 11/25/2012 1546   CALCIUM 9.1 02/01/2013 0750   GFRNONAA 87* 02/01/2013 0750   GFRAA >90 02/01/2013 0750    INR No results found for this basename: inr     Intake/Output Summary (Last 24 hours) at 02/01/13 1142 Last data filed at 02/01/13 0553  Gross per 24 hour  Intake    120 ml  Output   1025 ml  Net   -905 ml   Wound cx from OR: E. Coli  Assessment:  62 y.o. 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 -wound vac in place-RN states he is tolerating it well.   -DVT prophylaxis:  Heparin SQ -wound Cx organism is E. Coli-Abx per ID -will have MD sign wound vac orders for home vac.   Leontine Locket, PA-C [facility] [phone] 02/01/2013 11:42 AM

## 2013-02-01 NOTE — Progress Notes (Signed)
Occupational Therapy Treatment Patient Details Name: DEVLIN ALKIRE MRN: [id number] DOB: 16-Feb-1950 Today's Date: 02/01/2013 Time: [id number] OT Time Calculation (min): 20 min  OT Assessment / Plan / Recommendation  History of present illness JACO TREAS is a 62 y.o. male presenting with increased draining, odor, warmth from his fifth digit on his right foot wound. PMH is significant for uncontrolled DM, HTN, PAD, and dry gangrene of right fifth digit.  Patient s/p amputation rt. 4th and 5th toes.   OT comments  Pt making progress with functional goals, however, is very impulsive with decreased safety awareness. Pt's niece concerned about pt returning  Home today without anyone with him at home and she stated that she has expressed this concern to pt;s nurse  Follow Up Recommendations  No OT follow up;Supervision/Assistance - 24 hour    Barriers to Discharge   none, pt may be at home alone if leaving today?    Equipment Recommendations  3 in 1 bedside comode    Recommendations for Other Services    Frequency Min 2X/week   Progress towards OT Goals Progress towards OT goals: Progressing toward goals  Plan Discharge plan remains appropriate    Precautions / Restrictions Precautions Precautions: Fall Precaution Comments: Patient impulsive - decreased safety awareness Restrictions Weight Bearing Restrictions: Yes RLE Weight Bearing: Partial weight bearing RLE Partial Weight Bearing Percentage or Pounds: Heel only   Pertinent Vitals/Pain No c/o pain    ADL  Grooming: Performed;Wash/dry hands;Wash/dry face;Min guard;Supervision/safety Where Assessed - Grooming: Supported standing Lower Body Dressing: Performed;Min guard Where Assessed - Lower Body Dressing: Unsupported sitting;Supported sit to stand Toilet Transfer: Performed;Min Radio broadcast assistant Method: Sit to Loss adjuster, chartered: Regular height toilet;Grab bars Toileting - Clothing  Manipulation and Hygiene: Performed;Min guard;Supervision/safety Tub/Shower Transfer: Insurance claims handler Method: [employer] Used: Gait belt;Rolling walker Transfers/Ambulation Related to ADLs: Min gucues for safety, pt impulsive, not follwing safety instructions ADL Comments: Pt impulsive with RW, requires VCs for safe use of DME. Ambulated to toilet then sink to perform grooming tasks then returned to bed.    OT Diagnosis:    OT Problem List:   OT Treatment Interventions:     OT Goals(current goals can now be found in the care plan section) Acute Rehab OT Goals Patient Stated Goal: To go home soon  Visit Information  Last OT Received On: 02/01/13 Assistance Needed: +1 History of Present Illness: BASILIO SCHLICHTING is a 62 y.o. male presenting with increased draining, odor, warmth from his fifth digit on his right foot wound. PMH is significant for uncontrolled DM, HTN, PAD, and dry gangrene of right fifth digit.  Patient s/p amputation rt. 4th and 5th toes.    Subjective Data      Prior Functioning       Cognition  Cognition Arousal/Alertness: Awake/alert Behavior During Therapy: Impulsive Overall Cognitive Status: Within Functional Limits for tasks assessed Area of Impairment: Safety/judgement Safety/Judgement: Decreased awareness of safety    Mobility  Bed Mobility Bed Mobility: Supine to Sit;Sit to Supine;Scooting to HOB Supine to Sit: 7: Independent Sitting - Scoot to Edge of Bed: 7: Independent Sit to Supine: 7: Independent Scooting to HOB: 7: Independent Transfers Transfers: Sit to Stand;Stand to Sit Sit to Stand: 6: Modified independent (Device/Increase time);From bed;From toilet Stand to Sit: To bed;To toilet;6: Modified independent (Device/Increase time) Details for Transfer Assistance: Cues for safety, impulsive    Exercises      Balance Balance Balance Assessed: Yes Dynamic Sitting Balance  Dynamic Sitting - Balance Support:  No upper extremity supported;During functional activity;Feet unsupported Dynamic Sitting - Level of Assistance: 6: Modified independent (Device/Increase time) Dynamic Standing Balance Dynamic Standing - Balance Support: Left upper extremity supported;No upper extremity supported;During functional activity Dynamic Standing - Level of Assistance: 5: Stand by assistance;Other (comment) (min guard A)   End of Session OT - End of Session Equipment Utilized During Treatment: Gait belt;Rolling walker Activity Tolerance: Patient tolerated treatment well Patient left: in bed;with call bell/phone within reach;with family/visitor present  GO     Britt Bottom 02/01/2013, 2:45 PM

## 2013-02-01 NOTE — Progress Notes (Signed)
Family Medicine Teaching Service Daily Progress Note Intern Pager: [phone]  [name] Medical record number: [id number] Date of birth: [birth year] Age: 62 y.o. Gender: male  Primary Care Provider: Rosemarie Ax, MD Consultants: vascular Code Status: full  Pt Overview and Major Events to Date:  12/23 - Admitted with gangrainous toe 12/26 - Rt 4th/5th digits amputated 12/29 - PT recs HH PT and rolling walker 5" wheels, wound care recs wound vac change M/W/F by [facility]  Assessment and Plan:  Leonard [name] is a 62 y.o. male presenting with increased draining, odor, warmth from his fifth digit on his right foot wound. PMH is significant for uncontrolled DM, HTN, PAD, and dry gangrene of right fifth digit.   #Gangrene/osteomyelitis of right foot -  Followed by [facility] ([name]) for diabetic wound. Falsely elevated ABI of 86%; multiple previous revascularization procedures on 01/13/13 due to right SFA and severe tibial artery disease  - POD 4 s/p 4/5th digit amputation 12/26.  - On Vanc/Zosyn 6 days >> Wound growing e coli; Per ID, transitioned to bactrim DS 1 tab BID x 10 days from 12/29 and f/u in 10 days in ID clinic, ID to arrange - wound vac in place per wound recs, CM arranging [place].  # Diabetes - A1C 11.9 on 01/13/13 and pt with history of non adherence  - 25u lantus BID + got 45u resistant SSI coverage - increased lantus to 30u BID 12/29  # HTN - history of high BP (190s) when non adherent, SBP 140s here  - Cont amlodipine, atenolol, clonidine, and lisinopril, holding triamterene/HCTZ.   # HLD  - Cont statin   #Social: family would like an in home aid to come help deliver his medications and monitoring of his chronic diseases  - PT/OT consulted, f/u recommendations - need rolling walker with 5" wheels, HHPT and [place]. - care management consulted for walker and home health.  FEN/GI: Carb mod diet, NS KVO Prophylaxis: Lovenox SQ daily    Disposition: pending surgery recommendations and organization of home health.  Subjective: Patient feels well, pain controlled with medication, denies chest pain/dypsnea or other complaints.  Objective: Temp:  [98.3 F (36.8 C)-98.6 F (37 C)] 98.3 F (36.8 C) (12/30 0535) Pulse Rate:  [64-76] 70 (12/30 0535) Resp:  [16-17] 16 (12/30 0535) BP: (124-148)/(59-66) 142/66 mmHg (12/30 0535) SpO2:  [96 %-98 %] 98 % (12/30 0535) Physical Exam: General: NAD, well nourished  HEENT: NCAT, MMM Respiratory: Normal effort Abdomen: soft, non-tender, non-distended.  Extremities: Wound vac in place, borders of wound around vac appear in tact with no pus or erythema.  Laboratory:  Recent Labs Lab 01/28/13 0613 01/29/13 0440 02/01/13 0750  WBC 6.8 6.1 7.3  HGB 8.0* 7.8* 8.0*  HCT 25.1* 23.8* 24.7*  PLT 156 168 174    Recent Labs Lab 01/25/13 1700  01/28/13 0613 01/29/13 0440 02/01/13 0750  NA 133*  < > 136 135 141  K 5.0  < > 4.1 3.8 3.9  CL 99  < > 104 102 103  CO2 20  < > 21 21 25   BUN 45*  < > 14 11 10   CREATININE 1.59*  < > 1.05 0.88 0.99  CALCIUM 9.5  < > 8.5 8.5 9.1  PROT 7.5  --   --   --   --   BILITOT 0.2*  --   --   --   --   ALKPHOS 97  --   --   --   --  ALT 10  --   --   --   --   AST 11  --   --   --   --   GLUCOSE 231*  < > 316* 296* 200*  < > = values in this interval not displayed. Imaging/Diagnostic Tests: MR foot: Osteomyelitis involving entire shaft of the 5th metatarsal and the phalangeal bones of the little toe. The base of the 5th metatarsal is not involved.  [name], MD 02/01/2013, 10:00 AM PGY-2, [place] Intern pager: [phone], text pages welcome

## 2013-02-01 NOTE — Progress Notes (Signed)
Agree with PTA.    Jameison Haji, PT [phone]  

## 2013-02-01 NOTE — Progress Notes (Signed)
FMTS Attending Note: Kehinde ENiola,MD I  have seen and examined this patient, reviewed their chart. I have discussed this patient with the resident. I agree with the resident's findings, assessment and care plan.  

## 2013-02-02 ENCOUNTER — Telehealth: Payer: Self-pay | Admitting: Vascular Surgery

## 2013-02-02 LAB — ANAEROBIC CULTURE

## 2013-02-02 NOTE — Telephone Encounter (Addendum)
Message copied by Gena Fray on Wed Feb 02, 2013  4:47 PM ------      Message from: Gabriel Earing      Created: Wed Feb 02, 2013  2:43 PM       S/p 4th and 5th toe amputation by Dr. Donnetta Hutching.  He now has a wound vac.  F/u with Dr. Donnetta Hutching in 1-2 weeks.            Thanks,      Samantha ------  02/02/13: spoke with pt re appt, dpm

## 2013-02-03 NOTE — Discharge Summary (Signed)
[facility] Discharge Summary  [name] Medical record number: [id number] Date of birth: Mar 08, 1950 Age: 63 y.o. Gender: male Date of Admission: 01/25/2013  Date of Discharge: 02/01/2013 Admitting Physician: Dickie La, MD  Primary Care Provider: Rosemarie Ax, MD Consultants: Vascular surgery, ID  Indication for Hospitalization: Right foot gangrene/osteomyelitis  Discharge Diagnoses/Problem List:  1. Gangrene/osteomyelitis of right foot 2. Diabetes 3. Hypertension 4. Anemia 5. Hyperlipidemia  Disposition: Home  Discharge Condition: Stable  Discharge Exam: See exam from daily progress note.  Brief Hospital Course: [name] is a 63 y.o. male presenting with increased draining, odor, and warmth from fifth digit on right foot wound. PMH significant for uncontrolled DM, HTN, PAD, and dry gangrene of right fifth digit.   Gangrene/osteomyelitis of right foot - Pt was fallowed by [facility] for diabetic wound. ABI was falsely elevated at 86%. He had multiple previous revascularization procedures on 01/13/13 due to right SFA and severe tibial artery disease. He had 4/5th digit amputation 12/26 and tolerated procedure well. He was put on vanc/zosyn for 6 days and wound grew e coli so ID consult transitioned him to bactrim DS 1 tab BID x 10 days from 12/29. They were planning to set him up with follow up with Dr Early in 10 days in ID clinic, and vascular planned to set him up with follow-up in their clinic in ~2 weeks. He was fit with wound vac and had supplies arranged to go home with him, along with Home Health RN, PT, and rolling walker. RN to change wound vac / dressing M-W-F at bedside. He stated he had 24 hour supervision from family.  Diabetes - A1c 11.9 on 01/13/13 and pt with h/o nonadherence. He was started on lantus 25u BID and 45u resistant SSI coverage received day prior to d/c, so increased to lantus  30u BID 12/29. Blood glucose elevation likely chronic and worsened due to recent infection. Follow up as outpatient.  HTN - H/o high BPs (190s) when nonadherent, SBP 140s here. Continued amlodipine, atenolol, clonidine, and lisinopril but held triamterene/HCTZ so as to avoid hypotension due to placing on all medications that he may be partially nonadherent to at home. Follow up as outpatient.  Anemia - Appears chronically low, with 10-11 early Dec and continued slow drop to 8 on discharge. No obvious source of bleed other than surgery this hospitalization. F/u as outpatient and consider workup. See no evidence of colonoscopy in chart.  HLD - Continued statin.  Issues for Follow Up:  - Home Health arranged to change wound vac - F/u with ID arranged - F/u Hgb - F/u diabetes and HTN control and when / if need to start back triamterene/HCTZ.  Significant Procedures:  Amputation of right ray 4th and 5th toes  Significant Labs and Imaging:   Recent Labs Lab 01/28/13 0613 01/29/13 0440 02/01/13 0750  WBC 6.8 6.1 7.3  HGB 8.0* 7.8* 8.0*  HCT 25.1* 23.8* 24.7*  PLT 156 168 174    Recent Labs Lab 01/28/13 0613 01/29/13 0440 02/01/13 0750  NA 136 135 141  K 4.1 3.8 3.9  CL 104 102 103  CO2 21 21 25   GLUCOSE 316* 296* 200*  BUN 14 11 10   CREATININE 1.05 0.88 0.99  CALCIUM 8.5 8.5 9.1   Wound culture - few e coli sn to TMP/SMZ  Results/Tests Pending at Time of Discharge:  None  Discharge Medications:    Medication List  STOP taking these medications       triamterene-hydrochlorothiazide 75-50 MG per tablet  Commonly known as:  MAXZIDE      TAKE these medications       acetaminophen 325 MG tablet  Commonly known as:  TYLENOL  Take 2 tablets (650 mg total) by mouth every 6 (six) hours as needed for mild pain (or Fever >/= 101).     amLODipine 10 MG tablet  Commonly known as:  NORVASC  Take 1 tablet (10 mg total) by mouth daily.     atenolol 100 MG tablet   Commonly known as:  TENORMIN  Take 100 mg by mouth daily.     cloNIDine 0.2 MG tablet  Commonly known as:  CATAPRES  Take 1 tablet (0.2 mg total) by mouth 2 (two) times daily.     clopidogrel 75 MG tablet  Commonly known as:  PLAVIX  Take 1 tablet (75 mg total) by mouth daily.     glipiZIDE 10 MG tablet  Commonly known as:  GLUCOTROL  Take 1 tablet (10 mg total) by mouth 2 (two) times daily before a meal.     insulin glargine 100 units/mL Soln  Commonly known as:  LANTUS  Inject 30 Units into the skin 2 (two) times daily.     lisinopril 40 MG tablet  Commonly known as:  PRINIVIL,ZESTRIL  Take 1 tablet (40 mg total) by mouth daily.     metFORMIN 1000 MG tablet  Commonly known as:  GLUCOPHAGE  Take 1 tablet (1,000 mg total) by mouth 2 (two) times daily with a meal.     oxyCODONE 5 MG immediate release tablet  Commonly known as:  Oxy IR/ROXICODONE  Take 1-2 tablets (5-10 mg total) by mouth every 4 (four) hours as needed for moderate pain or severe pain.     polyethylene glycol packet  Commonly known as:  MIRALAX / GLYCOLAX  Take 17 g by mouth daily.     pravastatin 40 MG tablet  Commonly known as:  PRAVACHOL  Take 1 tablet (40 mg total) by mouth daily.     sulfamethoxazole-trimethoprim 800-160 MG per tablet  Commonly known as:  BACTRIM DS  Take 1 tablet by mouth every 12 (twelve) hours.        Discharge Instructions: Please refer to Patient Instructions section of EMR for full details.  Patient was counseled important signs and symptoms that should prompt return to medical care, changes in medications, dietary instructions, activity restrictions, and follow up appointments.   Follow-Up Appointments: Follow-up Information   Follow up with Conni Slipper, MD On 02/07/2013. (at 9:30 AM for hospital follow-up; please arrive at 9:15AM.)    Specialty:  Family Medicine   Contact information:   [place] [address] [phone]       Follow  up with EARLY, TODD, MD In 2 weeks. (Their office should call you; if not, please call the number above and set up an appointment.)    Specialty:  Vascular Surgery   Contact information:   [place] [address] [phone]       [name], MD 02/03/2013, 4:37 PM PGY-2, [place]

## 2013-02-04 NOTE — Discharge Summary (Signed)
FMTS Attending  Note: Leonard Raisch,MD I  have seen and examined this patient, reviewed their chart. I have discussed this patient with the resident. I agree with the resident's findings, assessment and care plan.  

## 2013-02-07 ENCOUNTER — Ambulatory Visit (INDEPENDENT_AMBULATORY_CARE_PROVIDER_SITE_OTHER): Payer: Medicaid Other | Admitting: Family Medicine

## 2013-02-07 ENCOUNTER — Encounter: Payer: Self-pay | Admitting: Family Medicine

## 2013-02-07 VITALS — BP 140/80 | HR 82 | Temp 98.2°F | Wt 205.0 lb

## 2013-02-07 DIAGNOSIS — D649 Anemia, unspecified: Secondary | ICD-10-CM

## 2013-02-07 DIAGNOSIS — I10 Essential (primary) hypertension: Secondary | ICD-10-CM

## 2013-02-07 DIAGNOSIS — I96 Gangrene, not elsewhere classified: Secondary | ICD-10-CM

## 2013-02-07 DIAGNOSIS — E119 Type 2 diabetes mellitus without complications: Secondary | ICD-10-CM

## 2013-02-07 DIAGNOSIS — F17200 Nicotine dependence, unspecified, uncomplicated: Secondary | ICD-10-CM

## 2013-02-07 LAB — CBC
HCT: 24.7 % — ABNORMAL LOW (ref 39.0–52.0)
Hemoglobin: 8.2 g/dL — ABNORMAL LOW (ref 13.0–17.0)
MCH: 26.1 pg (ref 26.0–34.0)
MCHC: 33.2 g/dL (ref 30.0–36.0)
MCV: 78.7 fL (ref 78.0–100.0)
PLATELETS: 245 10*3/uL (ref 150–400)
RBC: 3.14 MIL/uL — ABNORMAL LOW (ref 4.22–5.81)
RDW: 14.9 % (ref 11.5–15.5)
WBC: 7.6 10*3/uL (ref 4.0–10.5)

## 2013-02-07 MED ORDER — NICOTINE 7 MG/24HR TD PT24: 7.0000 mg | MEDICATED_PATCH | Freq: Every day | TRANSDERMAL | Status: DC

## 2013-02-07 NOTE — Patient Instructions (Addendum)
Good to see you today.  For your right foot, the nurses should continue checking/changing dressings M-W-F. Take bactrim regularly. Follow up as scheduled with ID and surgery. Return if you develop fevers, worsning redness or pain, or pus from the foot.  Follow up in 2 weeks with Dr Raeford Razor.  For your blood pressure,  - take norvasc, atenolol, clonidine, and lisinopril and do NOT take triamterene-HCTZ until further instructed by your doctor.  We are checking blood counts today.  I am ordering a home health aid 1-2 weeks and would also like your nurse to check fasting blood sugars 2-3 times weekly.

## 2013-02-07 NOTE — Progress Notes (Addendum)
Patient ID: NEHAL WERLEY, male   DOB: 12/12/1950, 63 y.o.   MRN: [id number] Subjective:   CC: Hospital follow-up  HPI:   1. Hospital follow-up: Patient is here for hospital follow-up s/p amputation of right 4-5th toes by Dr Donnetta Hutching. He was discharged with wound vac, bactrim, Home Health to change dressings M-W-F, supervision by sister who lives with him, and f/u scheduled with Dr Donnetta Hutching and ID. He denies fevers, chills, increased pain, erythema, or pus. Another sister is with him and voices concern about capability of the sister who lives with him to help him, requesting a home health aide.   2. Diabetes mellitus: Patient takes lantus 30 units BID and denies symptoms of hypoglycemia. He does not check glucose at home, reporting he does not understand how to use is meter. Dr Raeford Razor wants him to follow up with pharmacy/diabetes clinic.  3. BP - Patient does not check BP at home. When asked about medications, seems unsure about regimen he should be on. On discharge, triamterene-HCTZ was held due to normotensive in hospital and questionable compliance at home at that time. Does not report dizziness or fainting.  Review of Systems - Per HPI. Sister mentions he occasionally gets anxious and thinks it is due to not having nicotine patch.  SH:  Per sister, other sister whom patient lives with is ill and cannot help patient get in and out of bath. Trying to quit smoking.    Objective:  Physical Exam BP 140/80  Pulse 82  Temp(Src) 98.2 F (36.8 C) (Oral)  Wt 205 lb (92.987 kg) GEN: NAD HEENT: Atraumatic, normocephalic, neck supple, EOMI, sclera clear, poor dentition CV: difficult to auscultate right foot DP pulse but warm and well-perfused PULM: normal effort ABD: Soft, nondistended SKIN: No rash or cyanosis; warm and well-perfused; right foot with 4th and 5th digit amputations, with wound vac in place, borders of wound non-erythematous with no pus other than area of mild erythema at base of  foot, mild tenderness anterior foot, full ROM, no induration. EXTR: No lower extremity edema or calf tenderness PSYCH: Mood and affect euthymic, normal rate and volume of speech NEURO: Awake, alert, no focal deficits grossly, normal speech          Assessment:     CAGE HOLLEMAN is a 63 y.o. male with h/o recent amputation for osteomyelitis of right LE 4-5th digits here for hospital follow-up.    Plan:     # See problem list and after visit summary for problem-specific plans.   # Health Maintenance: Not discussed.  Follow-up: - 2 weeks with Dr Raeford Razor to f/u HTN, DM, and RLE.   [name], MD [place]

## 2013-02-08 NOTE — Assessment & Plan Note (Signed)
Stable, mildly improved by discharge. - CBC today. - At f/u, eval if pt has had colonoscopy.

## 2013-02-08 NOTE — Assessment & Plan Note (Signed)
Patient was discharged from hospital with triamterene-HCTZ held.  - Keep holding this due to currently controlled BP for age. - Continue norvasc, atenolol, clonidine, and lisinopril. - F/u at next visit.

## 2013-02-08 NOTE — Assessment & Plan Note (Signed)
Sister reports he is trying to quit and requested nicotine patch for patient. - Ordered 7mg /24 hours patces. BP stable.

## 2013-02-08 NOTE — Assessment & Plan Note (Addendum)
Appears to be continuing to heal with minimal erythema and no purulence. Afebrile. Did not remove wound vac due to nursing staff unable to replace at [facility].  - Keep appt you have with vascular surgeon 1/13. - F/u 2 weeks wit PCP Dr Raeford Razor. - Seek sooner care if you develop worsened pain, erythema, pus, fevers, or chills. - Continue M-W-F home health RN dressing changes.  - Encouraged pt to be sure he was taking bactrim, f/u with ID, and use walker to control any weight-bearing. - Ordered Home Health Aid daily for 1-2 weeks to help patient get around house, as his sister is concerned the other sister he lives with will not be able to do this well though pt had stated on discharge she would.

## 2013-02-08 NOTE — Assessment & Plan Note (Signed)
Pt reports taking lantus daily, however responses to questioning otherwise uncertain and he does not know how to use his meter. - Asked pt to have his HHRN check fasting BG 2-3 times weekly. - Bring meter to f/u with PCP or Dr Valentina Lucks in [place] clinic to go over how to use.

## 2013-02-09 ENCOUNTER — Encounter: Payer: Self-pay | Admitting: Internal Medicine

## 2013-02-09 ENCOUNTER — Ambulatory Visit (INDEPENDENT_AMBULATORY_CARE_PROVIDER_SITE_OTHER): Payer: Medicaid Other | Admitting: Internal Medicine

## 2013-02-09 VITALS — BP 159/74 | HR 76 | Temp 98.3°F | Ht 68.0 in | Wt 206.5 lb

## 2013-02-09 DIAGNOSIS — E1169 Type 2 diabetes mellitus with other specified complication: Secondary | ICD-10-CM

## 2013-02-09 DIAGNOSIS — M869 Osteomyelitis, unspecified: Secondary | ICD-10-CM

## 2013-02-09 DIAGNOSIS — M9080 Osteopathy in diseases classified elsewhere, unspecified site: Secondary | ICD-10-CM

## 2013-02-09 NOTE — Progress Notes (Signed)
Patient ID: Leonard [name], male   DOB: 1950-04-20, 63 y.o.   MRN: [id number]          [facility]  Patient Active Problem List   Diagnosis Date Noted  . Diabetic osteomyelitis 01/29/2013  . Acute kidney injury 01/26/2013  . Normocytic anemia 01/26/2013  . Diabetic ulcer of toe, R 1st plantar surface, viable 01/25/2013  . Gangrene of foot, right fifth metatarsal with osteomyelitis 01/13/2013  . Essential hypertension, benign 11/25/2012  . Blurry vision, bilateral 11/25/2012  . Diabetes mellitus, type 2 01/06/2012  . Mixed hyperlipidemia 07/17/2008  . TOBACCO ABUSE 07/17/2008    Patient's Medications  New Prescriptions   No medications on file  Previous Medications   ACETAMINOPHEN (TYLENOL) 325 MG TABLET    Take 2 tablets (650 mg total) by mouth every 6 (six) hours as needed for mild pain (or Fever >/= 101).   AMLODIPINE (NORVASC) 10 MG TABLET    Take 1 tablet (10 mg total) by mouth daily.   ATENOLOL (TENORMIN) 100 MG TABLET    Take 100 mg by mouth daily.   CLONIDINE (CATAPRES) 0.2 MG TABLET    Take 1 tablet (0.2 mg total) by mouth 2 (two) times daily.   CLOPIDOGREL (PLAVIX) 75 MG TABLET    Take 1 tablet (75 mg total) by mouth daily.   GLIPIZIDE (GLUCOTROL) 10 MG TABLET    Take 1 tablet (10 mg total) by mouth 2 (two) times daily before a meal.   INSULIN GLARGINE (LANTUS) 100 UNITS/ML SOLN    Inject 30 Units into the skin 2 (two) times daily.   LISINOPRIL (PRINIVIL,ZESTRIL) 40 MG TABLET    Take 1 tablet (40 mg total) by mouth daily.   METFORMIN (GLUCOPHAGE) 1000 MG TABLET    Take 1 tablet (1,000 mg total) by mouth 2 (two) times daily with a meal.   NICOTINE (NICODERM CQ - DOSED IN MG/24 HR) 7 MG/24HR PATCH    Place 1 patch (7 mg total) onto the skin daily.   OXYCODONE (OXY IR/ROXICODONE) 5 MG IMMEDIATE RELEASE TABLET    Take 1-2 tablets (5-10 mg total) by mouth every 4 (four) hours as needed for moderate pain or severe pain.   POLYETHYLENE GLYCOL  (MIRALAX / GLYCOLAX) PACKET    Take 17 g by mouth daily.   PRAVASTATIN (PRAVACHOL) 40 MG TABLET    Take 1 tablet (40 mg total) by mouth daily.   SULFAMETHOXAZOLE-TRIMETHOPRIM (BACTRIM DS) 800-160 MG PER TABLET    Take 1 tablet by mouth every 12 (twelve) hours.  Modified Medications   No medications on file  Discontinued Medications   No medications on file    Subjective: Mr. Da is him for his hospital followup visit. He has diabetes mellitus and a chronic right foot ulcer or was admitted to the hospital last month with gangrene. He underwent resection of his right fourth and fifth toe and had partial fourth and fifth metatarsal ray amputations. Operative cultures grew Escherichia coli. He was on IV antibiotics while hospitalized but was transitioned to oral trimethoprim sulfamethoxazole upon discharge. He is not certain what medications he is taking. He does not have his medications with him. He does not recognize any of his medications off of his medication list. He denies having any fever, chills or sweats. He is having minimal foot pain and thinks he is only taking one pain pill daily. He is due to follow up with his surgeon, Dr. Donnetta Hutching, on January 13. His VAC  dressing was changed 2 days ago. He does not recall his nurses telling him anything about the wound but thinks it is looking better.  Review of Systems: Pertinent items are noted in HPI.  Past Medical History  Diagnosis Date  . Hypertension   . PAD (peripheral artery disease)   . Gangrene of toe     right 5th/notes 01/04/2013   . High cholesterol   . Type II diabetes mellitus     History  Substance Use Topics  . Smoking status: Former Smoker -- 1.00 packs/day for 44 years    Types: Cigarettes  . Smokeless tobacco: Never Used  . Alcohol Use: No    Family History  Problem Relation Age of Onset  . Cancer Mother     lung cancer  . Hypertension Mother   . Cancer Sister     patient thinks it was uterine cancer  .  Hypertension Sister     No Known Allergies  Objective: Temp: 98.3 F (36.8 C) (01/07 1609) Temp src: Oral (01/07 1609) BP: 159/74 mmHg (01/07 1609) Pulse Rate: 76 (01/07 1609)  General: He is in no distress He has a VAC dressing in place on his right foot  Lab Results Lab Results  Component Value Date   WBC 7.6 02/07/2013   HGB 8.2* 02/07/2013   HCT 24.7* 02/07/2013   MCV 78.7 02/07/2013   PLT 245 02/07/2013    Lab Results  Component Value Date   CREATININE 0.99 02/01/2013   BUN 10 02/01/2013   NA 141 02/01/2013   K 3.9 02/01/2013   CL 103 02/01/2013   CO2 25 02/01/2013    Lab Results  Component Value Date   ALT 10 01/25/2013   AST 11 01/25/2013   ALKPHOS 97 01/25/2013   BILITOT 0.2* 01/25/2013    Lab Results  Component Value Date   CHOL 147 11/25/2012   HDL 27* 11/25/2012   LDLCALC 40 11/25/2012   TRIG 400* 11/25/2012   CHOLHDL 5.4 11/25/2012    Lab Results No results found for this basename: HIV1RNAQUANT, HIV1RNAVL, CD4TABS     Assessment: It is difficult to know how he is doing or whether he is even taking his antibiotic. I have given him a new medication list and highlighted trimethoprim sulfamethoxazole to give to his caregivers who fill out his pillbox. I will see him back after he sees Dr. Donnetta Hutching next week.  Plan: 1. Continue? Trimethoprim-sulfamethoxazole 2. Followup in one week   Michel Bickers, MD [facility] for [place] [phone] pager   [phone] cell 02/09/2013, 4:36 PM

## 2013-02-12 ENCOUNTER — Telehealth: Payer: Self-pay | Admitting: Family Medicine

## 2013-02-12 NOTE — Telephone Encounter (Signed)
CBC from recent visit is stable but still low at 8.2.  Will forward to PCP for management, but likely will need to bring patient back in for further lab testing and history-taking. No obvious source of bleed at last visit. MCV 78.7. In pt's age-group would consider slow blood loss from GI tract. Do not see evidence of prior colonoscopy.  [name], MD

## 2013-02-14 ENCOUNTER — Encounter: Payer: Self-pay | Admitting: Vascular Surgery

## 2013-02-15 ENCOUNTER — Ambulatory Visit (INDEPENDENT_AMBULATORY_CARE_PROVIDER_SITE_OTHER): Payer: Self-pay | Admitting: Vascular Surgery

## 2013-02-15 ENCOUNTER — Encounter: Payer: Self-pay | Admitting: Vascular Surgery

## 2013-02-15 VITALS — BP 129/70 | HR 77 | Temp 98.1°F | Ht 68.0 in | Wt 203.0 lb

## 2013-02-15 DIAGNOSIS — I70262 Atherosclerosis of native arteries of extremities with gangrene, left leg: Secondary | ICD-10-CM | POA: Insufficient documentation

## 2013-02-15 DIAGNOSIS — I70269 Atherosclerosis of native arteries of extremities with gangrene, unspecified extremity: Secondary | ICD-10-CM

## 2013-02-15 NOTE — Progress Notes (Signed)
The patient presents today for followup of his open a dictation for gangrene of his right fourth and fifth toe. He undergone her gram and atherectomy of the superficial femoral artery by Dr. Bridgett Larsson on 1211. He had progressive gangrenous changes and underwent amputation by myself on 01/28/2013. He has had a VAC dressing since that time. He has minimal discomfort and is treating this with Tylenol.  Physical exam his dictation site is contracted with approximately half centimeter open area over the indication. There is good granulation tissue. He does have a thick callus over the first metatarsal head but no tenderness and no evidence of infection of this.  Impression and plan a good [place] healing of his open indication for gangrene of his fourth and fifth toe. Has known severe tibial disease. Discuss with the patient and his family present but still is borderline for healing but has excellent Jaquari Reckner result. We'll see him again in one month. We'll continue his VAC dressing

## 2013-02-16 ENCOUNTER — Ambulatory Visit (INDEPENDENT_AMBULATORY_CARE_PROVIDER_SITE_OTHER): Payer: Medicaid Other | Admitting: Internal Medicine

## 2013-02-16 ENCOUNTER — Encounter: Payer: Self-pay | Admitting: Internal Medicine

## 2013-02-16 VITALS — BP 136/84 | HR 84 | Temp 98.4°F | Ht 68.0 in | Wt 206.5 lb

## 2013-02-16 DIAGNOSIS — E1169 Type 2 diabetes mellitus with other specified complication: Secondary | ICD-10-CM

## 2013-02-16 DIAGNOSIS — M9080 Osteopathy in diseases classified elsewhere, unspecified site: Secondary | ICD-10-CM

## 2013-02-16 DIAGNOSIS — M869 Osteomyelitis, unspecified: Secondary | ICD-10-CM

## 2013-02-16 LAB — C-REACTIVE PROTEIN: CRP: 2.6 mg/dL — ABNORMAL HIGH (ref <0.60)

## 2013-02-16 MED ORDER — SULFAMETHOXAZOLE-TMP DS 800-160 MG PO TABS: 1.0000 | ORAL_TABLET | Freq: Two times a day (BID) | ORAL | Status: DC

## 2013-02-16 NOTE — Progress Notes (Signed)
Patient ID: Leonard [name], male   DOB: 1950/07/31, 63 y.o.   MRN: [id number]         [facility] for Infectious Disease  Patient Active Problem List   Diagnosis Date Noted  . Atherosclerosis of native arteries of the extremities with gangrene 02/15/2013  . Diabetic osteomyelitis 01/29/2013  . Acute kidney injury 01/26/2013  . Normocytic anemia 01/26/2013  . Diabetic ulcer of toe, R 1st plantar surface, viable 01/25/2013  . Gangrene of foot, right fifth metatarsal with osteomyelitis 01/13/2013  . Essential hypertension, benign 11/25/2012  . Blurry vision, bilateral 11/25/2012  . Diabetes mellitus, type 2 01/06/2012  . Mixed hyperlipidemia 07/17/2008  . TOBACCO ABUSE 07/17/2008    Patient's Medications  New Prescriptions   No medications on file  Previous Medications   ACETAMINOPHEN (TYLENOL) 325 MG TABLET    Take 2 tablets (650 mg total) by mouth every 6 (six) hours as needed for mild pain (or Fever >/= 101).   AMLODIPINE (NORVASC) 10 MG TABLET    Take 1 tablet (10 mg total) by mouth daily.   ATENOLOL (TENORMIN) 100 MG TABLET    Take 100 mg by mouth daily.   CLONIDINE (CATAPRES) 0.2 MG TABLET    Take 1 tablet (0.2 mg total) by mouth 2 (two) times daily.   CLOPIDOGREL (PLAVIX) 75 MG TABLET    Take 1 tablet (75 mg total) by mouth daily.   GLIPIZIDE (GLUCOTROL) 10 MG TABLET    Take 1 tablet (10 mg total) by mouth 2 (two) times daily before a meal.   INSULIN GLARGINE (LANTUS) 100 UNITS/ML SOLN    Inject 30 Units into the skin 2 (two) times daily.   LISINOPRIL (PRINIVIL,ZESTRIL) 40 MG TABLET    Take 1 tablet (40 mg total) by mouth daily.   METFORMIN (GLUCOPHAGE) 1000 MG TABLET    Take 1 tablet (1,000 mg total) by mouth 2 (two) times daily with a meal.   NICOTINE (NICODERM CQ - DOSED IN MG/24 HR) 7 MG/24HR PATCH    Place 1 patch (7 mg total) onto the skin daily.   OXYCODONE (OXY IR/ROXICODONE) 5 MG IMMEDIATE RELEASE TABLET    Take 1-2 tablets (5-10 mg total) by mouth every 4  (four) hours as needed for moderate pain or severe pain.   POLYETHYLENE GLYCOL (MIRALAX / GLYCOLAX) PACKET    Take 17 g by mouth daily.   PRAVASTATIN (PRAVACHOL) 40 MG TABLET    Take 1 tablet (40 mg total) by mouth daily.  Modified Medications   Modified Medication Previous Medication   SULFAMETHOXAZOLE-TRIMETHOPRIM (BACTRIM DS) 800-160 MG PER TABLET sulfamethoxazole-trimethoprim (BACTRIM DS) 800-160 MG per tablet      Take 1 tablet by mouth every 12 (twelve) hours.    Take 1 tablet by mouth every 12 (twelve) hours.  Discontinued Medications   No medications on file    Subjective: Leonard [name] is in for his routine followup visit. He is now 19 days out from surgery for right fourth and fifth toe gangrene. He underwent amputation of his fourth and fifth toes and partial metatarsal ray amputations. He remains uncertain as to whether or not he is currently taking trimethoprim-sulfamethoxazole. After he went home and nurse filled out his pill box for him but he says that the antibiotic pill will exactly like his metformin. I suspect he is out since he was given a 10 day supply upon discharge. He saw Dr. early yesterday who reported that his wound is looking good without evidence of  active infection. Review of Systems: Pertinent items are noted in HPI.  Past Medical History  Diagnosis Date  . Hypertension   . PAD (peripheral artery disease)   . Gangrene of toe     right 5th/notes 01/04/2013   . High cholesterol   . Type II diabetes mellitus     History  Substance Use Topics  . Smoking status: Former Smoker -- 1.00 packs/day for 44 years    Types: Cigarettes  . Smokeless tobacco: Never Used  . Alcohol Use: No    Family History  Problem Relation Age of Onset  . Cancer Mother     lung cancer  . Hypertension Mother   . Cancer Sister     patient thinks it was uterine cancer  . Hypertension Sister     No Known Allergies  Objective: Temp: 98.4 F (36.9 C) (01/14 1544) Temp src: Oral  (01/14 1544) BP: 136/84 mmHg (01/14 1544) Pulse Rate: 84 (01/14 1544)  General: He is in better spirits His right foot VAC dressing remains in place  Lab Results  Component Value Date   CRP 6.7* 02/01/2013   Lab Results  Component Value Date   ESRSEDRATE 112* 01/25/2013      Assessment: He appears to have responded well to surgery and antibiotic therapy for his osteomyelitis.  Plan: 1. Repeat inflammatory markers in followup in one week   Michel Bickers, MD [facility] for [place] [phone] pager   [phone] cell 02/16/2013, 4:16 PM

## 2013-02-17 ENCOUNTER — Telehealth: Payer: Self-pay | Admitting: Family Medicine

## 2013-02-17 LAB — SEDIMENTATION RATE: Sed Rate: 125 mm/hr — ABNORMAL HIGH (ref 0–16)

## 2013-02-17 NOTE — Telephone Encounter (Signed)
Patient needs test strips & lancets sent to [place] for his AccuCheck Nano glucometer.

## 2013-02-18 ENCOUNTER — Telehealth: Payer: Self-pay | Admitting: Family Medicine

## 2013-02-18 NOTE — Telephone Encounter (Signed)
Pt called and needs test strips for his meter. jw

## 2013-02-20 ENCOUNTER — Other Ambulatory Visit: Payer: Self-pay | Admitting: Family Medicine

## 2013-02-20 DIAGNOSIS — E119 Type 2 diabetes mellitus without complications: Secondary | ICD-10-CM

## 2013-02-20 MED ORDER — ACCU-CHEK SOFT TOUCH LANCETS MISC: Status: DC

## 2013-02-20 MED ORDER — GLUCOSE BLOOD VI STRP: ORAL_STRIP | Status: DC

## 2013-02-20 NOTE — Telephone Encounter (Signed)
Received a fax from [facility] advising to change his atenolol to a cardio-selective beta blocker due to his concomitant diagnosis of type 2 DM. Patient would need to schedule a visit before I make this change. I would need to evaluate his blood pressure and have him agree to regular follow ups due to his history of non compliance.

## 2013-02-21 NOTE — Telephone Encounter (Signed)
Spoke with patient and he already has an  Appointment set up for 1/20 @ 1:30pm

## 2013-02-22 ENCOUNTER — Ambulatory Visit (INDEPENDENT_AMBULATORY_CARE_PROVIDER_SITE_OTHER): Payer: Medicaid Other | Admitting: Family Medicine

## 2013-02-22 ENCOUNTER — Encounter: Payer: Self-pay | Admitting: Family Medicine

## 2013-02-22 VITALS — BP 160/72 | HR 85 | Temp 98.6°F | Wt 208.0 lb

## 2013-02-22 DIAGNOSIS — I10 Essential (primary) hypertension: Secondary | ICD-10-CM

## 2013-02-22 DIAGNOSIS — D649 Anemia, unspecified: Secondary | ICD-10-CM

## 2013-02-22 LAB — CBC
HCT: 24.1 % — ABNORMAL LOW (ref 39.0–52.0)
HEMOGLOBIN: 7.9 g/dL — AB (ref 13.0–17.0)
MCH: 25.5 pg — ABNORMAL LOW (ref 26.0–34.0)
MCHC: 32.8 g/dL (ref 30.0–36.0)
MCV: 77.7 fL — ABNORMAL LOW (ref 78.0–100.0)
PLATELETS: 206 10*3/uL (ref 150–400)
RBC: 3.1 MIL/uL — ABNORMAL LOW (ref 4.22–5.81)
RDW: 15.4 % (ref 11.5–15.5)
WBC: 6.9 10*3/uL (ref 4.0–10.5)

## 2013-02-22 MED ORDER — HYDROCHLOROTHIAZIDE 12.5 MG PO CAPS: 12.5000 mg | ORAL_CAPSULE | Freq: Every day | ORAL | Status: DC

## 2013-02-22 NOTE — Patient Instructions (Addendum)
Thank you for coming in,   I added a new medication called hydrochlorthiazaide. Please follow up with me in 2 weeks to determine if your blood pressure has come down.  This medication can have an increase in your blood sugars so please let me know if they are elevated.    I drew some labs today and will call you with the results.    Please feel free to call with any questions or concerns at any time, at [phone]. --Dr. Raeford Razor

## 2013-02-22 NOTE — Assessment & Plan Note (Signed)
Added HCTZ 12.5 today. Warned that may cause a slight increase in his blood sugars.  - follow up in 2 weeks to determine if BP in under control  - if still not under control then may need to do a resistant HTN work up   - Renal artery stenosis, sleep apnea, etc.

## 2013-02-22 NOTE — Progress Notes (Signed)
     Subjective:     Patient ID: Leonard [name], male   DOB: Mar 19, 1950, 63 y.o.   MRN: [id number]  HPI Leonard [name] is presenting in for a follow up to his blood pressure and his anemia.   I was notified that I should switch Mr. Akridge to a more cardio selective beta blocker based on his disease of DM2.  He is compliant with his medications. Denies any chest pain, shortness of breath or headache or lower extremity edema. He is currently on the maximum dose of Norvasc, Lisinopril and Atenolol.  He is also taking clonidine.   Also seeing patient for anemia present on his last CBC. He denies any fatigue or tiredness acutely. He denies any bright red blood or black tarry stools.  He denies any bloody emesis. He is still healing from his fourth and fifth digit amputation that displayed gangrene prior to surgery.  He has never had a colonoscopy.    Review of Systems All other systems reviewed and otherwise normal.      Objective:   Physical Exam BP 160/72  Pulse 85  Temp(Src) 98.6 F (37 C) (Oral)  Wt 208 lb (94.348 kg). Gen: NAD, alert, cooperative with exam CV: RRR, good S1/S2, no murmur Resp: CTABL, no wheezes, non-labored Ext: No edema, warm, wound vac covering his right foot with support boot  Neuro: Alert and oriented,  . Assessment:         Plan:

## 2013-02-22 NOTE — Assessment & Plan Note (Signed)
Appears to be asymptomatic but has never had a colonoscopy.  - referral to GI for colonoscopy  - CBC and anemia panel today  - will call with the results.

## 2013-02-23 LAB — ANEMIA PANEL
%SAT: 19 % — AB (ref 20–55)
ABS Retic: 55.8 10*3/uL (ref 19.0–186.0)
FOLATE: 6.9 ng/mL
Ferritin: 756 ng/mL — ABNORMAL HIGH (ref 22–322)
Iron: 51 ug/dL (ref 42–165)
RBC.: 3.1 MIL/uL — ABNORMAL LOW (ref 4.22–5.81)
RETIC CT PCT: 1.8 % (ref 0.4–2.3)
TIBC: 270 ug/dL (ref 215–435)
UIBC: 219 ug/dL (ref 125–400)
VITAMIN B 12: 244 pg/mL (ref 211–911)

## 2013-02-24 ENCOUNTER — Ambulatory Visit (INDEPENDENT_AMBULATORY_CARE_PROVIDER_SITE_OTHER): Payer: Medicaid Other | Admitting: Internal Medicine

## 2013-02-24 ENCOUNTER — Encounter: Payer: Self-pay | Admitting: Internal Medicine

## 2013-02-24 VITALS — BP 174/80 | HR 86 | Temp 98.3°F | Ht 68.0 in | Wt 211.2 lb

## 2013-02-24 DIAGNOSIS — M869 Osteomyelitis, unspecified: Secondary | ICD-10-CM

## 2013-02-24 DIAGNOSIS — M9080 Osteopathy in diseases classified elsewhere, unspecified site: Secondary | ICD-10-CM

## 2013-02-24 DIAGNOSIS — E1169 Type 2 diabetes mellitus with other specified complication: Secondary | ICD-10-CM

## 2013-02-24 NOTE — Progress Notes (Addendum)
Patient ID: Leonard [name], male   DOB: 03-06-1950, 63 y.o.   MRN: [id number]         [facility] for Infectious Disease  Patient Active Problem List   Diagnosis Date Noted  . Atherosclerosis of native arteries of the extremities with gangrene 02/15/2013  . Diabetic osteomyelitis 01/29/2013  . Acute kidney injury 01/26/2013  . Normocytic anemia 01/26/2013  . Diabetic ulcer of toe, R 1st plantar surface, viable 01/25/2013  . Gangrene of foot, right fifth metatarsal with osteomyelitis 01/13/2013  . Essential hypertension, benign 11/25/2012  . Blurry vision, bilateral 11/25/2012  . Diabetes mellitus, type 2 01/06/2012  . Mixed hyperlipidemia 07/17/2008  . TOBACCO ABUSE 07/17/2008    Patient's Medications  New Prescriptions   No medications on file  Previous Medications   ACETAMINOPHEN (TYLENOL) 325 MG TABLET    Take 2 tablets (650 mg total) by mouth every 6 (six) hours as needed for mild pain (or Fever >/= 101).   AMLODIPINE (NORVASC) 10 MG TABLET    Take 1 tablet (10 mg total) by mouth daily.   ATENOLOL (TENORMIN) 100 MG TABLET    Take 100 mg by mouth daily.   CLONIDINE (CATAPRES) 0.2 MG TABLET    Take 1 tablet (0.2 mg total) by mouth 2 (two) times daily.   CLOPIDOGREL (PLAVIX) 75 MG TABLET    Take 1 tablet (75 mg total) by mouth daily.   GLIPIZIDE (GLUCOTROL) 10 MG TABLET    Take 1 tablet (10 mg total) by mouth 2 (two) times daily before a meal.   GLUCOSE BLOOD (ACCU-CHEK ACTIVE STRIPS) TEST STRIP    Use as instructed   HYDROCHLOROTHIAZIDE (MICROZIDE) 12.5 MG CAPSULE    Take 1 capsule (12.5 mg total) by mouth daily.   INSULIN GLARGINE (LANTUS) 100 UNITS/ML SOLN    Inject 30 Units into the skin 2 (two) times daily.   LANCETS (ACCU-CHEK SOFT TOUCH) LANCETS    Use as instructed   LISINOPRIL (PRINIVIL,ZESTRIL) 40 MG TABLET    Take 1 tablet (40 mg total) by mouth daily.   METFORMIN (GLUCOPHAGE) 1000 MG TABLET    Take 1 tablet (1,000 mg total) by mouth 2 (two) times daily with a  meal.   NICOTINE (NICODERM CQ - DOSED IN MG/24 HR) 7 MG/24HR PATCH    Place 1 patch (7 mg total) onto the skin daily.   OXYCODONE (OXY IR/ROXICODONE) 5 MG IMMEDIATE RELEASE TABLET    Take 1-2 tablets (5-10 mg total) by mouth every 4 (four) hours as needed for moderate pain or severe pain.   POLYETHYLENE GLYCOL (MIRALAX / GLYCOLAX) PACKET    Take 17 g by mouth daily.   PRAVASTATIN (PRAVACHOL) 40 MG TABLET    Take 1 tablet (40 mg total) by mouth daily.   SULFAMETHOXAZOLE-TRIMETHOPRIM (BACTRIM DS) 800-160 MG PER TABLET    Take 1 tablet by mouth every 12 (twelve) hours.  Modified Medications   No medications on file  Discontinued Medications   No medications on file    Subjective: Leonard [name] is in for his routine followup visit. He is accompanied today by his sister, [name]. He is now on day 27 of antibiotics. He has his medications with him and he is taking trimethoprim-sulfamethoxazole twice daily. He states that he is feeling well. He continues to have the VAC dressing changes 3 times a week. He has not seen his wound but states that his nurse tells him that it has been looking good. Nursing notes from her nose  dressing change yesterday reported no eschar or slough with the wound showing greater than 25% granulation tissue and greater than 25% clean non-granulating tissue. A minimal amount of serosanguineous drainage was noted. There were no measurements reported comparing the wound to previous examinations.  Review of Systems: Pertinent items are noted in HPI.  Past Medical History  Diagnosis Date  . Hypertension   . PAD (peripheral artery disease)   . Gangrene of toe     right 5th/notes 01/04/2013   . High cholesterol   . Type II diabetes mellitus     History  Substance Use Topics  . Smoking status: Former Smoker -- 1.00 packs/day for 44 years    Types: Cigarettes  . Smokeless tobacco: Never Used  . Alcohol Use: No    Family History  Problem Relation Age of Onset  . Cancer Mother      lung cancer  . Hypertension Mother   . Cancer Sister     patient thinks it was uterine cancer  . Hypertension Sister     No Known Allergies  Objective: Temp: 98.3 F (36.8 C) (01/22 1545) Temp src: Oral (01/22 1545) BP: 174/80 mmHg (01/22 1545) Pulse Rate: 86 (01/22 1545)  General: He is in good spirits and eager to talk about [facility] basketball Feet: Right foot VAC dressing is in place. He has thick calluses on the heel of his left foot. There also calluses medial to his left first MTP joint, under the third metatarsal head and lateral to his fifth toe. There are no ulcerations. His toenails are long and thickened  Lab Results Lab Results  Component Value Date   CRP 2.6* 02/16/2013   Lab Results  Component Value Date   ESRSEDRATE 125* 02/16/2013     Assessment: He has a slowly healing wound after her fourth and fifth toe and partial metatarsal ray resection for osteomyelitis. His inflammatory markers remain elevated. Continue trimethoprim sulfamethoxazole for now.  Plan: 1. Continue trimethoprim sulfamethoxazole 2. I suggested follow up with Dr. Blenda Mounts for the calluses on his left foot 3. Followup here on February 17   Michel Bickers, MD [facility] for Infectious Disease [place] [facility] [phone] pager   [phone] cell 02/24/2013, 4:20 PM   Addendum: After his visit I was able to get to photographs from [facility] showing that his wound measures approximately 7 x 4 cm in diameter

## 2013-02-25 ENCOUNTER — Telehealth: Payer: Self-pay | Admitting: Family Medicine

## 2013-02-25 NOTE — Telephone Encounter (Signed)
Pt is needing a new glucometer and all the supplies that are needed. Medicaid does not cover the one he currently has. jw

## 2013-02-25 NOTE — Telephone Encounter (Signed)
Gave verbal orders for 2 more months

## 2013-02-25 NOTE — Telephone Encounter (Signed)
Pamala Hurry called and needs verbal orders to continue wound care for Leonard [name]. The current orders end today. Please call Pamala Hurry at [phone]. jw

## 2013-03-02 ENCOUNTER — Telehealth: Payer: Self-pay

## 2013-03-02 ENCOUNTER — Telehealth: Payer: Self-pay | Admitting: Family Medicine

## 2013-03-02 DIAGNOSIS — I119 Hypertensive heart disease without heart failure: Secondary | ICD-10-CM

## 2013-03-02 DIAGNOSIS — E119 Type 2 diabetes mellitus without complications: Secondary | ICD-10-CM

## 2013-03-02 DIAGNOSIS — I10 Essential (primary) hypertension: Secondary | ICD-10-CM

## 2013-03-02 MED ORDER — ATENOLOL 100 MG PO TABS: 100.0000 mg | ORAL_TABLET | Freq: Every day | ORAL | Status: DC

## 2013-03-02 MED ORDER — CLONIDINE HCL 0.2 MG PO TABS: 0.2000 mg | ORAL_TABLET | Freq: Two times a day (BID) | ORAL | Status: DC

## 2013-03-02 MED ORDER — ACCU-CHEK SOFT TOUCH LANCETS MISC: Status: DC

## 2013-03-02 MED ORDER — ACCU-CHEK NANO SMARTVIEW W/DEVICE KIT: 30.0000 [IU] | PACK | Freq: Two times a day (BID) | Status: DC

## 2013-03-02 MED ORDER — GLUCOSE BLOOD VI STRP: ORAL_STRIP | Status: DC

## 2013-03-02 NOTE — Telephone Encounter (Signed)
Related message,patient voiced understanding. Leonard [name], [name]

## 2013-03-02 NOTE — Telephone Encounter (Signed)
[name] from [facility] called requesting a RX for a AccuCheck Viva Plus glucometer with test strips & lancets. Please included DX code on RX. Please send to [facility] on [address].

## 2013-03-02 NOTE — Telephone Encounter (Signed)
Refilled patient's chronic medications and sent an Accu-check nano to his pharmacy.

## 2013-03-04 ENCOUNTER — Telehealth: Payer: Self-pay | Admitting: *Deleted

## 2013-03-04 NOTE — Telephone Encounter (Signed)
Informed of below 

## 2013-03-04 NOTE — Telephone Encounter (Signed)
Pt needs the accu-check Aviva plus called in to [facility]. The one we sent a prescription for is not covered by medicaid. jw

## 2013-03-04 NOTE — Telephone Encounter (Signed)
Accucheck Aviva is the only glucometer covered by insurance. Please this specific one to pharmacy.

## 2013-03-04 NOTE — Telephone Encounter (Signed)
Pamala Hurry, [place] nurse called stating that wound has worsened. There is now undermining and upon first seeing patient, the wound vac had not been reconnected by the patient. He explained to [place] that he forgets to reconnect the machine. I scheduled patient for Monday, Mar 07, 2013.

## 2013-03-07 ENCOUNTER — Encounter: Payer: Self-pay | Admitting: Vascular Surgery

## 2013-03-07 ENCOUNTER — Other Ambulatory Visit: Payer: Self-pay | Admitting: Family Medicine

## 2013-03-07 ENCOUNTER — Encounter: Payer: Self-pay | Admitting: Gastroenterology

## 2013-03-07 DIAGNOSIS — E119 Type 2 diabetes mellitus without complications: Secondary | ICD-10-CM

## 2013-03-07 MED ORDER — ACCU-CHEK AVIVA DEVI
Status: AC
Start: 2013-03-07 — End: 2014-03-07

## 2013-03-08 ENCOUNTER — Ambulatory Visit (INDEPENDENT_AMBULATORY_CARE_PROVIDER_SITE_OTHER): Payer: Self-pay | Admitting: Vascular Surgery

## 2013-03-08 ENCOUNTER — Encounter: Payer: Self-pay | Admitting: Family Medicine

## 2013-03-08 ENCOUNTER — Encounter: Payer: Self-pay | Admitting: Vascular Surgery

## 2013-03-08 ENCOUNTER — Ambulatory Visit (INDEPENDENT_AMBULATORY_CARE_PROVIDER_SITE_OTHER): Payer: Medicaid Other | Admitting: Family Medicine

## 2013-03-08 VITALS — BP 181/86 | HR 81 | Ht 68.0 in | Wt 217.0 lb

## 2013-03-08 VITALS — BP 153/67 | HR 81 | Temp 98.4°F | Ht 68.0 in | Wt 217.0 lb

## 2013-03-08 DIAGNOSIS — I10 Essential (primary) hypertension: Secondary | ICD-10-CM

## 2013-03-08 DIAGNOSIS — D649 Anemia, unspecified: Secondary | ICD-10-CM

## 2013-03-08 DIAGNOSIS — I119 Hypertensive heart disease without heart failure: Secondary | ICD-10-CM

## 2013-03-08 DIAGNOSIS — H538 Other visual disturbances: Secondary | ICD-10-CM

## 2013-03-08 DIAGNOSIS — I70269 Atherosclerosis of native arteries of extremities with gangrene, unspecified extremity: Secondary | ICD-10-CM

## 2013-03-08 DIAGNOSIS — E119 Type 2 diabetes mellitus without complications: Secondary | ICD-10-CM

## 2013-03-08 LAB — RETICULOCYTES
ABS Retic: 72.6 10*3/uL (ref 19.0–186.0)
RBC.: 3.3 MIL/uL — AB (ref 4.22–5.81)
Retic Ct Pct: 2.2 % (ref 0.4–2.3)

## 2013-03-08 LAB — CBC
HCT: 26 % — ABNORMAL LOW (ref 39.0–52.0)
Hemoglobin: 8.4 g/dL — ABNORMAL LOW (ref 13.0–17.0)
MCH: 25.5 pg — ABNORMAL LOW (ref 26.0–34.0)
MCHC: 32.3 g/dL (ref 30.0–36.0)
MCV: 78.8 fL (ref 78.0–100.0)
Platelets: 160 10*3/uL (ref 150–400)
RBC: 3.3 MIL/uL — AB (ref 4.22–5.81)
RDW: 17.1 % — ABNORMAL HIGH (ref 11.5–15.5)
WBC: 7.1 10*3/uL (ref 4.0–10.5)

## 2013-03-08 LAB — LACTATE DEHYDROGENASE: LDH: 194 U/L (ref 94–250)

## 2013-03-08 LAB — HAPTOGLOBIN: HAPTOGLOBIN: 208 mg/dL (ref 45–215)

## 2013-03-08 MED ORDER — CLONIDINE HCL 0.2 MG PO TABS
0.2000 mg | ORAL_TABLET | Freq: Two times a day (BID) | ORAL | Status: DC
Start: 2013-03-08 — End: 2013-05-10

## 2013-03-08 NOTE — Progress Notes (Signed)
Patient presents today for followup of open a dictation of his right fourth and fifth toe. He was treated by Dr. Bridgett Larsson with atherectomy and angioplasty of his right superficial artery. He has known severe tibial disease. He had back placement and is discontinued this this week.  He does have good closure of the edges of the open amputation site. There is a significant amount of dead callus and skin was debrided from this area. There is no evidence of invasive infection spreading of his foot.  He will discontinue the VAC dressing and get to dry dressing. I will see him again in one month. This area still looked very borderline for healing. I explained to the patient and his friend present there is no other option for improving flow. We'll see him in one month he will notify should he develop any difficulty before then. I did explain that he is still at risk for below-knee amputation.

## 2013-03-08 NOTE — Patient Instructions (Addendum)
Thank you for coming in,   I refilled your medications. Please call me if your insurance does not cover your blood sugar meter.   Please call to make an appointment for ambulatory blood pressure monitoring with Dr. Valentina Lucks for his next available appointment.   I put in a referral to home health.   Please follow up with me in one month.    Please feel free to call with any questions or concerns at any time, at [phone]. --Dr. Raeford Razor

## 2013-03-08 NOTE — Progress Notes (Signed)
Subjective:     Patient ID: Leonard [name], male   DOB: 1950/11/06, 63 y.o.   MRN: [id number]  HPI SLADER VASCONEZ is here for HTN and diabetes follow up.  CHRONIC DIABETES  Disease Monitoring  Blood Sugar Ranges: unable to measure due to not having a meter. (meter was sent on 03/07/13)  Polyuria: no   Visual problems: yes, complaining of worsening vision.    Medication Compliance: yes  Medication Side Effects  Hypoglycemia: no   Preventitive Health Care  Eye Exam: performed today but unable to achieve any readings due to his pupils being too constricted.   Foot Exam: performed previously  Exercise: none  Complains of worsening blurry vision. He has not seen a doctor for his eyes in a long time. His peripheral vision is what is worsening. Currently does not wear any glasses or contacts.    HTN Disease Monitoring: Currently does not check his blood pressure unless he comes for a visit. Initially presented with 168/81.  Home BP Monitoring none Chest pain- none    Dyspnea- none Medications: Amlodipine, Atenolol, Clonidine, HCTZ, Lisinopril  Compliance-  Yes . Lightheadedness-  None   Edema- yes, bilaterally, trace   He is accompanied by a family friend. They are asking for a home CMA to come by and help with delivering of medications. His sister has several cardiac issues and it not able to help her brother with his chronic medical conditions.  They are asking for assistance because he mixes up his medications.  He vision also affects his medication compliance.    Current Outpatient Prescriptions on File Prior to Visit  Medication Sig Dispense Refill  . acetaminophen (TYLENOL) 325 MG tablet Take 2 tablets (650 mg total) by mouth every 6 (six) hours as needed for mild pain (or Fever >/= 101).  20 tablet  0  . amLODipine (NORVASC) 10 MG tablet Take 1 tablet (10 mg total) by mouth daily.  90 tablet  1  . atenolol (TENORMIN) 100 MG tablet Take 1 tablet (100 mg total) by mouth daily.   90 tablet  3  . Blood Glucose Monitoring Suppl (ACCU-CHEK AVIVA) device Use as instructed  1 each  0  . clopidogrel (PLAVIX) 75 MG tablet Take 1 tablet (75 mg total) by mouth daily.  30 tablet  11  . glipiZIDE (GLUCOTROL) 10 MG tablet Take 1 tablet (10 mg total) by mouth 2 (two) times daily before a meal.  90 tablet  1  . glucose blood (ACCU-CHEK ACTIVE STRIPS) test strip Use as instructed  100 each  12  . hydrochlorothiazide (MICROZIDE) 12.5 MG capsule Take 1 capsule (12.5 mg total) by mouth daily.  30 capsule  0  . insulin glargine (LANTUS) 100 units/mL SOLN Inject 30 Units into the skin 2 (two) times daily.  10 mL  0  . Lancets (ACCU-CHEK SOFT TOUCH) lancets Use as instructed  100 each  12  . lisinopril (PRINIVIL,ZESTRIL) 40 MG tablet Take 1 tablet (40 mg total) by mouth daily.  30 tablet  3  . metFORMIN (GLUCOPHAGE) 1000 MG tablet Take 1 tablet (1,000 mg total) by mouth 2 (two) times daily with a meal.  60 tablet  6  . nicotine (NICODERM CQ - DOSED IN MG/24 HR) 7 mg/24hr patch Place 1 patch (7 mg total) onto the skin daily.  28 patch  0  . oxyCODONE (OXY IR/ROXICODONE) 5 MG immediate release tablet Take 1-2 tablets (5-10 mg total) by mouth every 4 (four) hours  as needed for moderate pain or severe pain.  15 tablet  0  . polyethylene glycol (MIRALAX / GLYCOLAX) packet Take 17 g by mouth daily.  14 each  0  . pravastatin (PRAVACHOL) 40 MG tablet Take 1 tablet (40 mg total) by mouth daily.  90 tablet  1  . sulfamethoxazole-trimethoprim (BACTRIM DS) 800-160 MG per tablet Take 1 tablet by mouth every 12 (twelve) hours.  60 tablet  0   No current facility-administered medications on file prior to visit.    I have reviewed and updated the following as appropriate: allergies, current medications, past family history, past medical history, past social history, past surgical history and problem list SHx: lives with his sister.   Health Maintenance: Scheduled for an appt with GI for colonoscopy     Review of Systems All other systems reviewed and otherwise normal.      Objective:   Physical Exam BP 153/67  Pulse 81  Temp(Src) 98.4 F (36.9 C) (Oral)  Ht 5\' 8"  (1.727 m)  Wt 217 lb (98.431 kg)  BMI 33.00 kg/m2 Gen: NAD, alert, cooperative with exam, well-appearing CV: RRR, good S1/S2, no murmur, no edema, capillary refill brisk  Resp: CTABL, no wheezes, non-labored MSK: right foot is wrapped with blue surgical booty (dressing was not removed due to being seen by Dr. Donnetta Hutching, earlier in the day), odor is present, trace edema.  Skin: no rashes, normal turgor  Neuro: no gross deficits.  Psych: alert and oriented     Assessment:         Plan:

## 2013-03-08 NOTE — Assessment & Plan Note (Signed)
BP improved during his visit. Currently on several different medications with no improvement.   - referral to Dr. Valentina Lucks for amb blood pressure monitoring.

## 2013-03-08 NOTE — Assessment & Plan Note (Signed)
Meter has been sent. Will bring measurements to next visit for further adjustments.  - referral sent to home health.

## 2013-03-08 NOTE — Assessment & Plan Note (Signed)
Worsening vision. Unable to perform testing due to his constricted pupils.  - will need to call around to determine an optometrist can take his insurance.

## 2013-03-14 ENCOUNTER — Telehealth: Payer: Self-pay | Admitting: Family Medicine

## 2013-03-14 DIAGNOSIS — E119 Type 2 diabetes mellitus without complications: Secondary | ICD-10-CM

## 2013-03-14 NOTE — Telephone Encounter (Signed)
Pt called because he did receive his new monitor, but did not get any lancets or strips to go with it. He needs Aviva strips and the soft click lancets.He has not been able to check his sugar for days now. jw

## 2013-03-15 ENCOUNTER — Other Ambulatory Visit: Payer: Self-pay | Admitting: Family Medicine

## 2013-03-15 ENCOUNTER — Telehealth: Payer: Self-pay | Admitting: *Deleted

## 2013-03-15 MED ORDER — GLUCOSE BLOOD VI STRP: ORAL_STRIP | Status: DC

## 2013-03-15 NOTE — Telephone Encounter (Signed)
Message copied by Johny Shears on Tue Mar 15, 2013  1:40 PM ------      Message from: Rosemarie Ax      Created: Tue Mar 15, 2013 12:35 PM       Please call patient to let him know that his anemia is improving. He will still need to have a colonoscopy. Thank you. ------

## 2013-03-15 NOTE — Telephone Encounter (Signed)
Spoke with patient and informed him of below 

## 2013-03-15 NOTE — Telephone Encounter (Signed)
Error

## 2013-03-15 NOTE — Telephone Encounter (Signed)
Patient should have lancets from previous prescriptions. Will send Aviva strips.

## 2013-03-17 ENCOUNTER — Encounter: Payer: Medicaid Other | Admitting: Pharmacist

## 2013-03-17 ENCOUNTER — Telehealth: Payer: Self-pay | Admitting: *Deleted

## 2013-03-17 DIAGNOSIS — E119 Type 2 diabetes mellitus without complications: Secondary | ICD-10-CM

## 2013-03-17 MED ORDER — ACCU-CHEK SOFTCLIX LANCET DEV MISC: Status: DC

## 2013-03-17 NOTE — Progress Notes (Signed)
Erroneous encounter. Asked to reschedule.

## 2013-03-17 NOTE — Assessment & Plan Note (Signed)
Asked to reschedule due to late arrival

## 2013-03-17 NOTE — Telephone Encounter (Signed)
Marlowe Kays from [facility]  office called to request NPI number for patient.  Pt has appt for diabetic eye exam, NPI given. Derl Barrow, RN

## 2013-03-17 NOTE — Telephone Encounter (Signed)
Pt came in for appt. He has not been able to receive lancets.  Contacted [facility] they never received order on 03/02/13.   Resent order with dx code. Fleeger, Salome Spotted

## 2013-03-22 ENCOUNTER — Ambulatory Visit: Payer: Medicaid Other | Admitting: Vascular Surgery

## 2013-03-28 ENCOUNTER — Telehealth: Payer: Self-pay | Admitting: Family Medicine

## 2013-03-28 NOTE — Telephone Encounter (Signed)
[name] from [facility] called to report that Leonard [name]'s BP has been high on her last two visits today it was 175/100. He also has edema in his lower extremities. If you have any questions please call Pamala Hurry at [phone]. jw

## 2013-03-29 ENCOUNTER — Ambulatory Visit: Payer: Medicaid Other | Admitting: Internal Medicine

## 2013-03-30 NOTE — Telephone Encounter (Signed)
Patient is on several HTN medications. Is being referred to Dr. Valentina Lucks for ambulatory blood pressure monitoring.  Compliance has also been an issue. If compliant with medications, will need to be seen for evaluation of secondary HTN.

## 2013-03-30 NOTE — Telephone Encounter (Signed)
Relayed message ,Pamala Hurry from [facility]  voiced understanding. Liandro Thelin, [name]

## 2013-03-31 ENCOUNTER — Ambulatory Visit: Payer: Medicaid Other | Admitting: Pharmacist

## 2013-04-04 ENCOUNTER — Inpatient Hospital Stay (HOSPITAL_COMMUNITY)
Admission: EM | Admit: 2013-04-04 | Discharge: 2013-04-20 | DRG: 233 | Disposition: A | Discharge status: Skilled Nursing Facility | Payer: Medicaid Other | Attending: Cardiothoracic Surgery | Admitting: Cardiothoracic Surgery

## 2013-04-04 ENCOUNTER — Encounter: Payer: Self-pay | Admitting: Vascular Surgery

## 2013-04-04 ENCOUNTER — Ambulatory Visit: Payer: Medicaid Other | Admitting: Gastroenterology

## 2013-04-04 ENCOUNTER — Emergency Department (HOSPITAL_COMMUNITY): Payer: Medicaid Other

## 2013-04-04 ENCOUNTER — Encounter (HOSPITAL_COMMUNITY): Payer: Self-pay | Admitting: Emergency Medicine

## 2013-04-04 DIAGNOSIS — E782 Mixed hyperlipidemia: Secondary | ICD-10-CM

## 2013-04-04 DIAGNOSIS — E1129 Type 2 diabetes mellitus with other diabetic kidney complication: Secondary | ICD-10-CM | POA: Diagnosis present

## 2013-04-04 DIAGNOSIS — M9080 Osteopathy in diseases classified elsewhere, unspecified site: Secondary | ICD-10-CM

## 2013-04-04 DIAGNOSIS — E119 Type 2 diabetes mellitus without complications: Secondary | ICD-10-CM

## 2013-04-04 DIAGNOSIS — Z8249 Family history of ischemic heart disease and other diseases of the circulatory system: Secondary | ICD-10-CM

## 2013-04-04 DIAGNOSIS — N189 Chronic kidney disease, unspecified: Secondary | ICD-10-CM | POA: Diagnosis present

## 2013-04-04 DIAGNOSIS — D62 Acute posthemorrhagic anemia: Secondary | ICD-10-CM | POA: Diagnosis not present

## 2013-04-04 DIAGNOSIS — E876 Hypokalemia: Secondary | ICD-10-CM | POA: Diagnosis not present

## 2013-04-04 DIAGNOSIS — J449 Chronic obstructive pulmonary disease, unspecified: Secondary | ICD-10-CM | POA: Diagnosis present

## 2013-04-04 DIAGNOSIS — I5031 Acute diastolic (congestive) heart failure: Secondary | ICD-10-CM

## 2013-04-04 DIAGNOSIS — IMO0002 Reserved for concepts with insufficient information to code with codable children: Secondary | ICD-10-CM | POA: Diagnosis present

## 2013-04-04 DIAGNOSIS — Z951 Presence of aortocoronary bypass graft: Secondary | ICD-10-CM

## 2013-04-04 DIAGNOSIS — I2510 Atherosclerotic heart disease of native coronary artery without angina pectoris: Secondary | ICD-10-CM

## 2013-04-04 DIAGNOSIS — E1165 Type 2 diabetes mellitus with hyperglycemia: Secondary | ICD-10-CM | POA: Diagnosis present

## 2013-04-04 DIAGNOSIS — D509 Iron deficiency anemia, unspecified: Secondary | ICD-10-CM | POA: Diagnosis not present

## 2013-04-04 DIAGNOSIS — F17200 Nicotine dependence, unspecified, uncomplicated: Secondary | ICD-10-CM

## 2013-04-04 DIAGNOSIS — I517 Cardiomegaly: Secondary | ICD-10-CM

## 2013-04-04 DIAGNOSIS — E1169 Type 2 diabetes mellitus with other specified complication: Secondary | ICD-10-CM

## 2013-04-04 DIAGNOSIS — J810 Acute pulmonary edema: Secondary | ICD-10-CM

## 2013-04-04 DIAGNOSIS — Z7982 Long term (current) use of aspirin: Secondary | ICD-10-CM

## 2013-04-04 DIAGNOSIS — J9819 Other pulmonary collapse: Secondary | ICD-10-CM | POA: Diagnosis present

## 2013-04-04 DIAGNOSIS — Z794 Long term (current) use of insulin: Secondary | ICD-10-CM

## 2013-04-04 DIAGNOSIS — E11621 Type 2 diabetes mellitus with foot ulcer: Secondary | ICD-10-CM

## 2013-04-04 DIAGNOSIS — J9600 Acute respiratory failure, unspecified whether with hypoxia or hypercapnia: Secondary | ICD-10-CM

## 2013-04-04 DIAGNOSIS — I129 Hypertensive chronic kidney disease with stage 1 through stage 4 chronic kidney disease, or unspecified chronic kidney disease: Secondary | ICD-10-CM | POA: Diagnosis present

## 2013-04-04 DIAGNOSIS — M869 Osteomyelitis, unspecified: Secondary | ICD-10-CM

## 2013-04-04 DIAGNOSIS — E7800 Pure hypercholesterolemia, unspecified: Secondary | ICD-10-CM | POA: Diagnosis present

## 2013-04-04 DIAGNOSIS — I739 Peripheral vascular disease, unspecified: Secondary | ICD-10-CM

## 2013-04-04 DIAGNOSIS — N179 Acute kidney failure, unspecified: Secondary | ICD-10-CM

## 2013-04-04 DIAGNOSIS — L97509 Non-pressure chronic ulcer of other part of unspecified foot with unspecified severity: Secondary | ICD-10-CM

## 2013-04-04 DIAGNOSIS — J9621 Acute and chronic respiratory failure with hypoxia: Secondary | ICD-10-CM

## 2013-04-04 DIAGNOSIS — Z87891 Personal history of nicotine dependence: Secondary | ICD-10-CM

## 2013-04-04 DIAGNOSIS — I2582 Chronic total occlusion of coronary artery: Secondary | ICD-10-CM | POA: Diagnosis present

## 2013-04-04 DIAGNOSIS — J4489 Other specified chronic obstructive pulmonary disease: Secondary | ICD-10-CM | POA: Diagnosis present

## 2013-04-04 DIAGNOSIS — D649 Anemia, unspecified: Secondary | ICD-10-CM

## 2013-04-04 DIAGNOSIS — S98139A Complete traumatic amputation of one unspecified lesser toe, initial encounter: Secondary | ICD-10-CM

## 2013-04-04 DIAGNOSIS — I70269 Atherosclerosis of native arteries of extremities with gangrene, unspecified extremity: Secondary | ICD-10-CM

## 2013-04-04 DIAGNOSIS — I214 Non-ST elevation (NSTEMI) myocardial infarction: Principal | ICD-10-CM

## 2013-04-04 DIAGNOSIS — I10 Essential (primary) hypertension: Secondary | ICD-10-CM

## 2013-04-04 DIAGNOSIS — I509 Heart failure, unspecified: Secondary | ICD-10-CM

## 2013-04-04 LAB — TROPONIN I
Troponin I: <0.3 ng/mL (ref <0.30)
Troponin I: <0.3 ng/mL (ref <0.30)
Troponin I: 0.38 ng/mL (ref <0.30)

## 2013-04-04 LAB — HEMOGLOBIN A1C
HEMOGLOBIN A1C: 8.7 % — AB (ref <5.7)
MEAN PLASMA GLUCOSE: 203 mg/dL — AB (ref <117)

## 2013-04-04 LAB — CBC WITH DIFFERENTIAL/PLATELET
BASOS ABS: 0 10*3/uL (ref 0.0–0.1)
BASOS PCT: 0 % (ref 0–1)
EOS PCT: 4 % (ref 0–5)
Eosinophils Absolute: 0.3 10*3/uL (ref 0.0–0.7)
HEMATOCRIT: 30.5 % — AB (ref 39.0–52.0)
Hemoglobin: 9.8 g/dL — ABNORMAL LOW (ref 13.0–17.0)
Lymphocytes Relative: 51 % — ABNORMAL HIGH (ref 12–46)
Lymphs Abs: 3.2 10*3/uL (ref 0.7–4.0)
MCH: 26.6 pg (ref 26.0–34.0)
MCHC: 32.1 g/dL (ref 30.0–36.0)
MCV: 82.7 fL (ref 78.0–100.0)
MONO ABS: 0.5 10*3/uL (ref 0.1–1.0)
MONOS PCT: 7 % (ref 3–12)
Neutro Abs: 2.4 10*3/uL (ref 1.7–7.7)
Neutrophils Relative %: 38 % — ABNORMAL LOW (ref 43–77)
Platelets: 148 10*3/uL — ABNORMAL LOW (ref 150–400)
RBC: 3.69 MIL/uL — ABNORMAL LOW (ref 4.22–5.81)
RDW: 17.3 % — AB (ref 11.5–15.5)
WBC: 6.4 10*3/uL (ref 4.0–10.5)

## 2013-04-04 LAB — BASIC METABOLIC PANEL
BUN: 16 mg/dL (ref 6–23)
CO2: 23 meq/L (ref 19–32)
CREATININE: 0.85 mg/dL (ref 0.50–1.35)
Calcium: 9 mg/dL (ref 8.4–10.5)
Chloride: 102 mEq/L (ref 96–112)
GFR calc non Af Amer: >90 mL/min (ref >90)
GLUCOSE: 274 mg/dL — AB (ref 70–99)
Potassium: 3.4 mEq/L — ABNORMAL LOW (ref 3.7–5.3)
Sodium: 141 mEq/L (ref 137–147)

## 2013-04-04 LAB — GLUCOSE, CAPILLARY
GLUCOSE-CAPILLARY: 148 mg/dL — AB (ref 70–99)
Glucose-Capillary: 221 mg/dL — ABNORMAL HIGH (ref 70–99)
Glucose-Capillary: 182 mg/dL — ABNORMAL HIGH (ref 70–99)

## 2013-04-04 LAB — CREATININE, SERUM
Creatinine, Ser: 0.88 mg/dL (ref 0.50–1.35)
GFR calc Af Amer: >90 mL/min (ref >90)

## 2013-04-04 LAB — PRO B NATRIURETIC PEPTIDE: Pro B Natriuretic peptide (BNP): 374.4 pg/mL — ABNORMAL HIGH (ref 0–125)

## 2013-04-04 LAB — CBC
HCT: 28.1 % — ABNORMAL LOW (ref 39.0–52.0)
HEMOGLOBIN: 9.1 g/dL — AB (ref 13.0–17.0)
MCH: 26.6 pg (ref 26.0–34.0)
MCHC: 32.4 g/dL (ref 30.0–36.0)
MCV: 82.2 fL (ref 78.0–100.0)
Platelets: 144 10*3/uL — ABNORMAL LOW (ref 150–400)
RBC: 3.42 MIL/uL — ABNORMAL LOW (ref 4.22–5.81)
RDW: 17.2 % — AB (ref 11.5–15.5)
WBC: 6.3 10*3/uL (ref 4.0–10.5)

## 2013-04-04 LAB — I-STAT TROPONIN, ED: Troponin i, poc: 0 ng/mL (ref 0.00–0.08)

## 2013-04-04 LAB — C-REACTIVE PROTEIN: CRP: <0.5 mg/dL — ABNORMAL LOW (ref <0.60)

## 2013-04-04 LAB — SEDIMENTATION RATE: SED RATE: 25 mm/h — AB (ref 0–16)

## 2013-04-04 MED ORDER — SODIUM CHLORIDE 0.9 % IJ SOLN
3.0000 mL | INTRAMUSCULAR | Status: DC | PRN
  Administered 2013-04-06: 3 mL via INTRAVENOUS

## 2013-04-04 MED ORDER — NITROGLYCERIN 2 % TD OINT
1.0000 [in_us] | TOPICAL_OINTMENT | Freq: Once | TRANSDERMAL | Status: AC
  Administered 2013-04-04: 1 [in_us] via TOPICAL
  Filled 2013-04-04: qty 1

## 2013-04-04 MED ORDER — ONDANSETRON HCL 4 MG/2ML IJ SOLN: 4.0000 mg | Freq: Four times a day (QID) | INTRAMUSCULAR | Status: DC | PRN

## 2013-04-04 MED ORDER — ONDANSETRON HCL 4 MG PO TABS: 4.0000 mg | ORAL_TABLET | Freq: Four times a day (QID) | ORAL | Status: DC | PRN

## 2013-04-04 MED ORDER — HYDROCODONE-ACETAMINOPHEN 5-325 MG PO TABS
1.0000 | ORAL_TABLET | ORAL | Status: DC | PRN
  Administered 2013-04-05: 2 via ORAL
  Filled 2013-04-04: qty 2

## 2013-04-04 MED ORDER — HEPARIN (PORCINE) IN NACL 100-0.45 UNIT/ML-% IJ SOLN
1350.0000 [IU]/h | INTRAMUSCULAR | Status: DC
  Administered 2013-04-05: 1300 [IU]/h via INTRAVENOUS
  Administered 2013-04-05: 1350 [IU]/h via INTRAVENOUS
  Filled 2013-04-04 (×2): qty 250

## 2013-04-04 MED ORDER — FUROSEMIDE 10 MG/ML IJ SOLN
80.0000 mg | Freq: Three times a day (TID) | INTRAMUSCULAR | Status: DC
  Administered 2013-04-04 (×3): 80 mg via INTRAVENOUS
  Filled 2013-04-04 (×6): qty 8

## 2013-04-04 MED ORDER — ACETAMINOPHEN 650 MG RE SUPP: 650.0000 mg | Freq: Four times a day (QID) | RECTAL | Status: DC | PRN

## 2013-04-04 MED ORDER — INSULIN GLARGINE 100 UNIT/ML ~~LOC~~ SOLN
30.0000 [IU] | Freq: Two times a day (BID) | SUBCUTANEOUS | Status: DC
  Administered 2013-04-04 (×2): 30 [IU] via SUBCUTANEOUS
  Filled 2013-04-04 (×4): qty 0.3

## 2013-04-04 MED ORDER — SODIUM CHLORIDE 0.9 % IV SOLN
250.0000 mL | INTRAVENOUS | Status: DC | PRN
  Administered 2013-04-06: 250 mL via INTRAVENOUS

## 2013-04-04 MED ORDER — CARVEDILOL 3.125 MG PO TABS
3.1250 mg | ORAL_TABLET | Freq: Two times a day (BID) | ORAL | Status: DC
  Administered 2013-04-04 – 2013-04-05 (×2): 3.125 mg via ORAL
  Filled 2013-04-04 (×4): qty 1

## 2013-04-04 MED ORDER — CLONIDINE HCL 0.2 MG PO TABS
0.2000 mg | ORAL_TABLET | Freq: Two times a day (BID) | ORAL | Status: DC
  Administered 2013-04-04 – 2013-04-06 (×6): 0.2 mg via ORAL
  Filled 2013-04-04 (×8): qty 1

## 2013-04-04 MED ORDER — INSULIN ASPART 100 UNIT/ML ~~LOC~~ SOLN
0.0000 [IU] | Freq: Three times a day (TID) | SUBCUTANEOUS | Status: DC
  Administered 2013-04-04: 3 [IU] via SUBCUTANEOUS
  Administered 2013-04-04 – 2013-04-05 (×2): 5 [IU] via SUBCUTANEOUS

## 2013-04-04 MED ORDER — NITROGLYCERIN IN D5W 200-5 MCG/ML-% IV SOLN
10.0000 ug/min | INTRAVENOUS | Status: DC
  Administered 2013-04-04: 10 ug/min via INTRAVENOUS
  Filled 2013-04-04: qty 250

## 2013-04-04 MED ORDER — ISOSORBIDE DINITRATE 10 MG PO TABS
10.0000 mg | ORAL_TABLET | Freq: Three times a day (TID) | ORAL | Status: DC
  Administered 2013-04-04 – 2013-04-06 (×8): 10 mg via ORAL
  Filled 2013-04-04 (×12): qty 1

## 2013-04-04 MED ORDER — SODIUM CHLORIDE 0.9 % IJ SOLN
3.0000 mL | Freq: Two times a day (BID) | INTRAMUSCULAR | Status: DC
  Administered 2013-04-04 – 2013-04-06 (×5): 3 mL via INTRAVENOUS

## 2013-04-04 MED ORDER — FUROSEMIDE 10 MG/ML IJ SOLN
40.0000 mg | Freq: Once | INTRAMUSCULAR | Status: AC
Start: 2013-04-04 — End: 2013-04-04
  Administered 2013-04-04: 40 mg via INTRAVENOUS
  Filled 2013-04-04: qty 4

## 2013-04-04 MED ORDER — AMLODIPINE BESYLATE 10 MG PO TABS
10.0000 mg | ORAL_TABLET | Freq: Every day | ORAL | Status: DC
  Administered 2013-04-04: 10 mg via ORAL
  Filled 2013-04-04: qty 1

## 2013-04-04 MED ORDER — SODIUM CHLORIDE 0.9 % IJ SOLN: 3.0000 mL | Freq: Two times a day (BID) | INTRAMUSCULAR | Status: DC

## 2013-04-04 MED ORDER — SIMVASTATIN 20 MG PO TABS
20.0000 mg | ORAL_TABLET | Freq: Every day | ORAL | Status: DC
  Administered 2013-04-04 – 2013-04-06 (×3): 20 mg via ORAL
  Filled 2013-04-04 (×4): qty 1

## 2013-04-04 MED ORDER — ASPIRIN EC 81 MG PO TBEC
81.0000 mg | DELAYED_RELEASE_TABLET | Freq: Every day | ORAL | Status: DC
  Administered 2013-04-05 – 2013-04-06 (×2): 81 mg via ORAL
  Filled 2013-04-04 (×3): qty 1

## 2013-04-04 MED ORDER — HEPARIN SODIUM (PORCINE) 5000 UNIT/ML IJ SOLN
5000.0000 [IU] | Freq: Three times a day (TID) | INTRAMUSCULAR | Status: DC
  Administered 2013-04-04 (×2): 5000 [IU] via SUBCUTANEOUS
  Filled 2013-04-04 (×3): qty 1

## 2013-04-04 MED ORDER — ASPIRIN 81 MG PO CHEW
324.0000 mg | CHEWABLE_TABLET | Freq: Once | ORAL | Status: AC
  Administered 2013-04-04: 324 mg via ORAL
  Filled 2013-04-04: qty 4

## 2013-04-04 MED ORDER — ACETAMINOPHEN 325 MG PO TABS: 650.0000 mg | ORAL_TABLET | Freq: Four times a day (QID) | ORAL | Status: DC | PRN

## 2013-04-04 MED ORDER — LISINOPRIL 40 MG PO TABS
40.0000 mg | ORAL_TABLET | Freq: Every day | ORAL | Status: DC
  Administered 2013-04-04 – 2013-04-06 (×3): 40 mg via ORAL
  Filled 2013-04-04 (×5): qty 1

## 2013-04-04 NOTE — Progress Notes (Signed)
Interim Progress Note  Received call from nurse that troponin elevated to 0.38 but patient asleep, not complaining of pain tonight. Most recent vital signs are WNL/stable. Cardiology saw patient today and plans for catheterization Wed/Thursday.  Echo today showed EF 55-60, , mod LVH, grade 2 diastolic dysfunction, mildly dilated left atrium.  - Ordered 2 more troponins and changed from SQ heparin to heparin drip per pharmacy after speaking with on-call cardiologist Dr Aundra Dubin.  [name], MD

## 2013-04-04 NOTE — Progress Notes (Signed)
  Echocardiogram 2D Echocardiogram has been performed.  Leonard [name] 04/04/2013, 2:51 PM

## 2013-04-04 NOTE — Progress Notes (Signed)
SATURATION QUALIFICATIONS: (This note is used to comply with regulatory documentation for home oxygen)  Patient Saturations on Room Air at Rest = 94%  Patient Saturations on Room Air while Ambulating = 85%  Patient Saturations on 3 Liters of oxygen while Ambulating = 90-97%  Please briefly explain why patient needs home oxygen:desaturation on [place], [place]

## 2013-04-04 NOTE — Consult Note (Addendum)
CARDIOLOGY CONSULT NOTE       Patient ID: Leonard [name] MRN: [id number] DOB/AGE: 03/14/1950 63 y.o.  Admit date: 04/04/2013 Referring Physician: Lindell Noe Primary Physician: Rosemarie Ax, MD Primary Cardiologist:  New Reason for Consultation:  Pulmonary edema  Active Problems:   Acute pulmonary edema   Acute CHF   HPI:   63 yo diabetic, HTN elevated lipis previous heavy smoker admitted with sudden onset dyspnea.  Went to bed feeling fine. Awoke early am with severe dyspnea.  EMS noted rales Rx with diuretic and CPAP CXR with pulmonary edema CE and kerley B lines.  No chest pain or history of CAD.  Does have PVD with amputation of 4th/5th toe on right foot with previous stenting rigtht SFA.  ECG with no acute changes and  R/O  BNP only 374.  Compliant with meds.  High salt diet.  Ambulating this am with improved dyspnea but sats still less than 90% off oxygen with ambulation.  Echo is pending  Denies fever cough sputum.  Does not carry diagnosis of COPD but over 40 pack year history of smoking    ROS All other systems reviewed and negative except as noted above  Past Medical History  Diagnosis Date  . Hypertension   . PAD (peripheral artery disease)   . Gangrene of toe     right 5th/notes 01/04/2013   . High cholesterol   . Type II diabetes mellitus     Family History  Problem Relation Age of Onset  . Cancer Mother     lung cancer  . Hypertension Mother   . Cancer Sister     patient thinks it was uterine cancer  . Hypertension Sister     History   Social History  . Marital Status: Single    Spouse Name: N/A    Number of Children: N/A  . Years of Education: N/A   Occupational History  . Not on file.   Social History Main Topics  . Smoking status: Former Smoker -- 1.00 packs/day for 44 years    Types: Cigarettes  . Smokeless tobacco: Never Used  . Alcohol Use: No  . Drug Use: No  . Sexual Activity: Not Currently   Other Topics Concern  . Not on file     Social History Narrative   Lives with sister, [name] in [place].  Retired - former Sports coach.    Past Surgical History  Procedure Laterality Date  . Throat surgery  1983    polyps  . Angioplasty / stenting femoral Right 01/13/2013    superficial femoral artery x 2 (3 mm x 60 mm, 4 mm x 60 mm)  . Tonsillectomy and adenoidectomy  ~ 1965  . Amputation Right 01/28/2013    Procedure: RAY AMPUTATION RIGHT  4th & 5th TOE;  Surgeon: Rosetta Posner, MD;  Location: [facility] OR;  Service: Vascular;  Laterality: Right;     . amLODipine  10 mg Oral Daily  . [START ON 04/05/2013] aspirin EC  81 mg Oral Daily  . carvedilol  3.125 mg Oral BID WC  . cloNIDine  0.2 mg Oral BID  . furosemide  80 mg Intravenous TID  . heparin  5,000 Units Subcutaneous 3 times per day  . insulin aspart  0-15 Units Subcutaneous TID WC  . insulin glargine  30 Units Subcutaneous BID  . lisinopril  40 mg Oral Daily  . simvastatin  20 mg Oral QHS  . sodium chloride  3 mL Intravenous  Q12H      Physical Exam: Blood pressure 155/90, pulse 92, temperature 98.1 F (36.7 C), temperature source Oral, resp. rate 20, height 5\' 8"  (1.727 m), weight 231 lb 8 oz (105.008 kg), SpO2 95.00%.   Affect appropriate Chronically ill black male  HEENT: normal Neck supple with no adenopathy JVP normal no bruits no thyromegaly Lungs rales base no wheezing and good diaphragmatic motion Heart:  S1/S2 SEM murmur, no rub, gallop or click PMI normal Abdomen: benighn, BS positve, no tenderness, no AAA no bruit.  No HSM or HJR Distal pulses poor below knees  No edema Neuro non-focal Skin warm and dry No muscular weakness Amputation 4th/5th toe on right    Labs:   Lab Results  Component Value Date   WBC 6.3 04/04/2013   HGB 9.1* 04/04/2013   HCT 28.1* 04/04/2013   MCV 82.2 04/04/2013   PLT 144* 04/04/2013    Recent Labs Lab 04/04/13 0625 04/04/13 1035  NA 141  --   K 3.4*  --   CL 102  --   CO2 23  --   BUN 16  --    CREATININE 0.85 0.88  CALCIUM 9.0  --   GLUCOSE 274*  --    Lab Results  Component Value Date   TROPONINI <0.30 04/04/2013    Lab Results  Component Value Date   CHOL 147 11/25/2012   CHOL 143 12/08/2008   CHOL 202* 11/23/2007   Lab Results  Component Value Date   HDL 27* 11/25/2012   HDL 33* 12/08/2008   HDL 40 11/23/2007   Lab Results  Component Value Date   LDLCALC 40 11/25/2012   LDLCALC 71 12/08/2008   LDLCALC 108* 11/23/2007   Lab Results  Component Value Date   TRIG 400* 11/25/2012   TRIG 196* 12/08/2008   TRIG 271* 11/23/2007   Lab Results  Component Value Date   CHOLHDL 5.4 11/25/2012   CHOLHDL 4.3 Ratio 12/08/2008   CHOLHDL 5.1 Ratio 11/23/2007   No results found for this basename: LDLDIRECT      Radiology: Dg Chest Portable 1 View  04/04/2013   CLINICAL DATA:  Shortness of breath  EXAM: PORTABLE CHEST - 1 VIEW  COMPARISON:  03/30/2012  FINDINGS: Interstitial prominence, including Kerley B-lines. There is likely a small right effusion. Generous heart size, although strictly within normal limits. No pneumothorax. No acute osseous findings.  IMPRESSION: Pulmonary edema.   Electronically Signed   By: Jorje Guild M.D.   On: 04/04/2013 06:48    EKG:  SR no acute ST/T wave changes    ASSESSMENT AND PLAN:  Pulmonary Edema:  Echo is pending  BNP disproportionately low compared to clinical presentation.  Beta blocker changed to coreg.  Ace added  D/C calcium blocker  Add nitrates Continue iv lasix  Further w/u will depend on echo results.  Suspect he will need right and left heart cath to assess pressures and r/o CAD given multiple CRF;s and PVD.  Previous smoking history and marked Anemia contributing.   Anemia:  guaic stools check iron  And ferritin.   HTN:  Adjust meds per CHF d/c calcium blocker.  Cr stable  COPD:  Contributing to dyspnea and desats with ambulation.  PFT;s when CHF cleared   Suspect he will be ready for cath on Wednesday or Thursday.  Have  not done orders or placed on schedule yet   Signed: Jenkins Rouge 04/04/2013, 2:17 PM

## 2013-04-04 NOTE — H&P (Signed)
[facility] Admission History and Physical Service Pager: [phone]  [name] Medical record number: [id number] Date of birth: 1950-02-09 Age: 63 y.o. Gender: male  Primary Care Provider: Rosemarie Ax, MD Consultants: ID Code Status: Full Code  Chief Complaint: SOB  Assessment and Plan: Leonard [name] is a 63 y.o. male presenting with acute pulmonary edema/volume overlead.  PMH is significant for uncontrolled DM-2, Anemia, HTN, HLD, PAD s/p angioplasty of superficial femoral artery, recent osteomyelitis (right foot).  Acute CHF/Acute Pulmonary Edema - Patient very SOB on arrival to ED and required CPAP.  This was taken off and patient became very tachypneic again and was placed on BiPAP and given Lasix and Nitro.  He improved and is currently on 3 L [place] [facility] [place].  Chest xray and clinical picture consistent with acute pulmonary edema. BNP only mildly elevated at 374.  BMP did not reveal any renal insufficiency.  - Will admit to Telemetry, Attending Dr. Lindell Noe.  - Obtaining 2D echo - Continuing home Lisinopril, Clonidine, Amlodipine. Holding Beta blocker due to acute decompensation.  - IV Lasix 80 mg TID - Strict I/O's, Daily weights - Given acuity and no evidence of renal insufficiency on BMP,  I am concerned that he has had a recent ischemic event.  Cycling Troponin x 3. Aspirin 81 mg daily.  Will likely need cardiology consult today.   DM-2, Uncontrolled - Obtaining A1C - Continuing home Lantus 30 Units BID - SSI Moderate  Diabetic Osteomyelitis; Recent amputation of right 4th and 5th toes - Patient has been following with ID, Dr. Megan Salon regarding osteo - He has completed course of Bactrim - I spoke with Dr. Megan Salon who recommended repeat CRP and ESR.  He will follow Leonard [name] on discharge.  HTN  - Continuing home Lisinopril, Clonidine, Amlodipine. Holding Beta blocker due to acute decompensation.   HLD - Continuing statin (using  hospital formulary.  FEN/GI: Heart Healthy, Carb modified diet. SL IV. Prophylaxis: Heparin SQ  Disposition: Pending clinical improvement.   History of Present Illness:  Leonard [name] is a 63 y.o. male with uncontrolled DM-2, Anemia, HTN, HLD, and PAD s/p angioplasty of superficial femoral artery who presents with acute SOB.  Patient reports that he started "breathing funny" and was acutely SOB at 4 am this morning.  No associated chest pain, cough, fever, chills.  He states that he has noticed increasing swelling of his lower extremities since his recent hospitalization and surgery (01/2013) but has not thought much about it.  He has previously been feeling well.  No reported PND or orthopnea.  No recent illness.    He does note that approximately 1 week ago he had a brief episode of chest pain which lasted a few minutes.  Not associated with exertion.  Located sternally.  No radiation.  Severity was mild.  Pain was relieved after drinking some water.   Review Of Systems: Per HPI.  Otherwise 12 point review of systems was performed and was unremarkable.  Patient Active Problem List   Diagnosis Date Noted  . Acute pulmonary edema 04/04/2013  . Atherosclerosis of native arteries of the extremities with gangrene 02/15/2013  . Diabetic osteomyelitis 01/29/2013  . Acute kidney injury 01/26/2013  . Normocytic anemia 01/26/2013  . Diabetic ulcer of toe, R 1st plantar surface, viable 01/25/2013  . Gangrene of foot, right fifth metatarsal with osteomyelitis 01/13/2013  . Essential hypertension, benign 11/25/2012  . Blurry vision, bilateral 11/25/2012  . Diabetes mellitus, type  2 01/06/2012  . Mixed hyperlipidemia 07/17/2008  . TOBACCO ABUSE 07/17/2008   Past Medical History: Past Medical History  Diagnosis Date  . Hypertension   . PAD (peripheral artery disease)   . Gangrene of toe     right 5th/notes 01/04/2013   . High cholesterol   . Type II diabetes mellitus    Past Surgical  History: Past Surgical History  Procedure Laterality Date  . Throat surgery  1983    polyps  . Angioplasty / stenting femoral Right 01/13/2013    superficial femoral artery x 2 (3 mm x 60 mm, 4 mm x 60 mm)  . Tonsillectomy and adenoidectomy  ~ 1965  . Amputation Right 01/28/2013    Procedure: RAY AMPUTATION RIGHT  4th & 5th TOE;  Surgeon: Rosetta Posner, MD;  Location: [facility] OR;  Service: Vascular;  Laterality: Right;   Social History: History  Substance Use Topics  . Smoking status: Former Smoker -- 1.00 packs/day for 44 years    Types: Cigarettes  . Smokeless tobacco: Never Used  . Alcohol Use: No   Family History: Family History  Problem Relation Age of Onset  . Cancer Mother     lung cancer  . Hypertension Mother   . Cancer Sister     patient thinks it was uterine cancer  . Hypertension Sister    Allergies and Medications: No Known Allergies No current facility-administered medications on file prior to encounter.   Current Outpatient Prescriptions on File Prior to Encounter  Medication Sig Dispense Refill  . amLODipine (NORVASC) 10 MG tablet Take 1 tablet (10 mg total) by mouth daily.  90 tablet  1  . atenolol (TENORMIN) 100 MG tablet Take 1 tablet (100 mg total) by mouth daily.  90 tablet  3  . cloNIDine (CATAPRES) 0.2 MG tablet Take 1 tablet (0.2 mg total) by mouth 2 (two) times daily.  90 tablet  1  . glipiZIDE (GLUCOTROL) 10 MG tablet Take 1 tablet (10 mg total) by mouth 2 (two) times daily before a meal.  90 tablet  1  . hydrochlorothiazide (MICROZIDE) 12.5 MG capsule Take 1 capsule (12.5 mg total) by mouth daily.  30 capsule  0  . insulin glargine (LANTUS) 100 units/mL SOLN Inject 30 Units into the skin 2 (two) times daily.  10 mL  0  . lisinopril (PRINIVIL,ZESTRIL) 40 MG tablet Take 1 tablet (40 mg total) by mouth daily.  30 tablet  3  . metFORMIN (GLUCOPHAGE) 1000 MG tablet Take 1 tablet (1,000 mg total) by mouth 2 (two) times daily with a meal.  60 tablet  6  .  nicotine (NICODERM CQ - DOSED IN MG/24 HR) 7 mg/24hr patch Place 1 patch (7 mg total) onto the skin daily.  28 patch  0  . pravastatin (PRAVACHOL) 40 MG tablet Take 1 tablet (40 mg total) by mouth daily.  90 tablet  1  . Blood Glucose Monitoring Suppl (ACCU-CHEK AVIVA) device Use as instructed  1 each  0  . glucose blood (ACCU-CHEK AVIVA) test strip Use as instructed  100 each  12  . Lancet Devices (ACCU-CHEK SOFTCLIX) lancets Use as instructed to test 4 times a day.  Dx code 250.00.  May change to fit patients meter  100 each  12  . Lancets (ACCU-CHEK SOFT TOUCH) lancets Use as instructed  100 each  12    Objective: BP 165/82  Pulse 85  Temp(Src) 98.3 F (36.8 C) (Axillary)  Resp 19  SpO2  100% Exam: General: African American gentleman, resting in bed, using supplemental O2 ([place]), does not appear in any acute distress. HEENT: NCAT. No scleral icterus. MMM.  Cardiovascular: Tachycardic. No murmurs, rubs, or gallops appreciated.  Respiratory: Diffuse crackles and scattered wheezing noted.  Abdomen: protuberant, soft, nontender. No palpable organomegaly. Extremities:  3+ pitting LE edema bilaterally (extends to thighs).   Skin: Right foot - callus noted at first MTP.  Healing wound located on the lateral aspect of his right foot (patient with amputation of right 4th and 5th toe amputation).  Neuro: AO x 3. No focal deficits.   Labs and Imaging: CBC BMET   Recent Labs Lab 04/04/13 0625  WBC 6.4  HGB 9.8*  HCT 30.5*  PLT 148*    Recent Labs Lab 04/04/13 0625  NA 141  K 3.4*  CL 102  CO2 23  BUN 16  CREATININE 0.85  GLUCOSE 274*  CALCIUM 9.0     BNP (last 3 results)  Recent Labs  04/04/13 0625  PROBNP 374.4*   Dg Chest Portable 1 View 04/04/2013   CLINICAL DATA:  Shortness of breath  EXAM: PORTABLE CHEST - 1 VIEW  COMPARISON:  03/30/2012  FINDINGS: Interstitial prominence, including Kerley B-lines. There is likely a small right effusion. Generous heart size, although  strictly within normal limits. No pneumothorax. No acute osseous findings.  IMPRESSION: Pulmonary edema.    EKG - Sinus tachycardia, Probable LAE.  Coral Spikes, DO 04/04/2013, 8:53 AM PGY-2, [place] pager: [phone], text pages welcome

## 2013-04-04 NOTE — Progress Notes (Signed)
UR completed Roshad Hack K. Deejay Koppelman, RN, BSN, [place], CCM  04/04/2013 12:08 PM

## 2013-04-04 NOTE — Progress Notes (Signed)
Patient brought to the emergency room via EMS with increased shortness of breath. Patient came in on CPAP at 7cm. Placed patient on Servo i NIV with the following settings IPAP-14 EPAP-7 with oxygen at 50% and set rate at 10bpm. Breath sounds crackles in lower lobes.

## 2013-04-04 NOTE — ED Provider Notes (Signed)
CSN: [id number]     Arrival date & time 04/04/13  0618 History   First MD Initiated Contact with Patient 04/04/13 [id number]     Chief Complaint  Patient presents with  . Shortness of Breath     Patient is a 63 y.o. male presenting with shortness of breath. The history is provided by the patient and the EMS personnel.  Shortness of Breath Severity:  Severe Onset quality:  Sudden Duration: just prior to arrival. Timing:  Constant Progression:  Worsening Chronicity:  New Relieved by:  Oxygen (CPAP by EMS) Worsened by:  Nothing tried Associated symptoms: no chest pain, no fever, no syncope and no vomiting   pt reports he went to bed feeling well, then woke up with acute SOB.  He denies CP  EMS was called and pt was tachypneic/hypoxic in tripod position and CPAP was placed and pt began to improve. He was also given NTG by EMS   Past Medical History  Diagnosis Date  . Hypertension   . PAD (peripheral artery disease)   . Gangrene of toe     right 5th/notes 01/04/2013   . High cholesterol   . Type II diabetes mellitus    Past Surgical History  Procedure Laterality Date  . Throat surgery  1983    polyps  . Angioplasty / stenting femoral Right 01/13/2013    superficial femoral artery x 2 (3 mm x 60 mm, 4 mm x 60 mm)  . Tonsillectomy and adenoidectomy  ~ 1965  . Amputation Right 01/28/2013    Procedure: RAY AMPUTATION RIGHT  4th & 5th TOE;  Surgeon: Rosetta Posner, MD;  Location: [facility] OR;  Service: Vascular;  Laterality: Right;   Family History  Problem Relation Age of Onset  . Cancer Mother     lung cancer  . Hypertension Mother   . Cancer Sister     patient thinks it was uterine cancer  . Hypertension Sister    History  Substance Use Topics  . Smoking status: Former Smoker -- 1.00 packs/day for 44 years    Types: Cigarettes  . Smokeless tobacco: Never Used  . Alcohol Use: No    Review of Systems  Constitutional: Negative for fever.  Respiratory: Positive for shortness of  breath.   Cardiovascular: Negative for chest pain and syncope.  Gastrointestinal: Negative for vomiting.  Neurological: Negative for syncope.  All other systems reviewed and are negative.      Allergies  Review of patient's allergies indicates no known allergies.  Home Medications   Current Outpatient Rx  Name  Route  Sig  Dispense  Refill  . acetaminophen (TYLENOL) 325 MG tablet   Oral   Take 2 tablets (650 mg total) by mouth every 6 (six) hours as needed for mild pain (or Fever >/= 101).   20 tablet   0   . amLODipine (NORVASC) 10 MG tablet   Oral   Take 1 tablet (10 mg total) by mouth daily.   90 tablet   1   . atenolol (TENORMIN) 100 MG tablet   Oral   Take 1 tablet (100 mg total) by mouth daily.   90 tablet   3   . Blood Glucose Monitoring Suppl (ACCU-CHEK AVIVA) device      Use as instructed   1 each   0   . cloNIDine (CATAPRES) 0.2 MG tablet   Oral   Take 1 tablet (0.2 mg total) by mouth 2 (two) times daily.   [place]  tablet   1   . clopidogrel (PLAVIX) 75 MG tablet   Oral   Take 1 tablet (75 mg total) by mouth daily.   30 tablet   11   . glipiZIDE (GLUCOTROL) 10 MG tablet   Oral   Take 1 tablet (10 mg total) by mouth 2 (two) times daily before a meal.   90 tablet   1   . glucose blood (ACCU-CHEK AVIVA) test strip      Use as instructed   100 each   12   . hydrochlorothiazide (MICROZIDE) 12.5 MG capsule   Oral   Take 1 capsule (12.5 mg total) by mouth daily.   30 capsule   0   . insulin glargine (LANTUS) 100 units/mL SOLN   Subcutaneous   Inject 30 Units into the skin 2 (two) times daily.   10 mL   0   . Lancet Devices (ACCU-CHEK SOFTCLIX) lancets      Use as instructed to test 4 times a day.  Dx code 250.00.  May change to fit patients meter   100 each   12   . Lancets (ACCU-CHEK SOFT TOUCH) lancets      Use as instructed   100 each   12     May change to preferred by patient's insurance.  T ...   . lisinopril  (PRINIVIL,ZESTRIL) 40 MG tablet   Oral   Take 1 tablet (40 mg total) by mouth daily.   30 tablet   3   . metFORMIN (GLUCOPHAGE) 1000 MG tablet   Oral   Take 1 tablet (1,000 mg total) by mouth 2 (two) times daily with a meal.   60 tablet   6   . nicotine (NICODERM CQ - DOSED IN MG/24 HR) 7 mg/24hr patch   Transdermal   Place 1 patch (7 mg total) onto the skin daily.   28 patch   0   . oxyCODONE (OXY IR/ROXICODONE) 5 MG immediate release tablet   Oral   Take 1-2 tablets (5-10 mg total) by mouth every 4 (four) hours as needed for moderate pain or severe pain.   15 tablet   0   . polyethylene glycol (MIRALAX / GLYCOLAX) packet   Oral   Take 17 g by mouth daily.   14 each   0   . pravastatin (PRAVACHOL) 40 MG tablet   Oral   Take 1 tablet (40 mg total) by mouth daily.   90 tablet   1   . sulfamethoxazole-trimethoprim (BACTRIM DS) 800-160 MG per tablet   Oral   Take 1 tablet by mouth every 12 (twelve) hours.   60 tablet   0    Pulse 101  Resp 25  SpO2 100% BP 165/82  Pulse 85  Temp(Src) 98.3 F (36.8 C) (Axillary)  Resp 19  SpO2 100%  Physical Exam CONSTITUTIONAL: Well developed/well nourished, anxious HEAD: Normocephalic/atraumatic EYES: EOMI/PERRL ENMT: Mucous membranes moist NECK: supple no meningeal signs SPINE:entire spine nontender CV: S1/S2 noted, no murmurs/rubs/gallops noted LUNGS: tachypneic, crackles bilaterally ABDOMEN: soft, nontender, no rebound or guarding GU:no cva tenderness NEURO: Pt is awake/alert, moves all extremitiesx4 EXTREMITIES: pulses normal, full ROM. Well healed wound to right foot SKIN: warm, color normal PSYCH: no abnormalities of mood noted  ED Course  Procedures CRITICAL CARE Performed by: Sharyon Cable Total critical care time: 31 Critical care time was exclusive of separately billable procedures and treating other patients. Critical care was necessary to treat or prevent imminent or  life-threatening  deterioration. Critical care was time spent personally by me on the following activities: development of treatment plan with patient and/or surrogate as well as nursing, discussions with consultants, evaluation of patient's response to treatment, examination of patient, obtaining history from patient or surrogate, ordering and performing treatments and interventions, ordering and review of laboratory studies, ordering and review of radiographic studies, pulse oximetry and re-evaluation of patient's condition.   6:52 AM Suspect acute pulmonary edema We took patient off CPAP but he became tachypneic, so was placed back on bipap and placed on NTG drip and also lasix 7:57 AM Pt improved He is now back off BIPAP Will place on NTG paste D/w family medicine resident will admit  Labs Review Labs Reviewed  BASIC METABOLIC PANEL - Abnormal; Notable for the following:    Potassium 3.4 (*)    Glucose, Bld 274 (*)    All other components within normal limits  CBC WITH DIFFERENTIAL - Abnormal; Notable for the following:    RBC 3.69 (*)    Hemoglobin 9.8 (*)    HCT 30.5 (*)    RDW 17.3 (*)    Platelets 148 (*)    Neutrophils Relative % 38 (*)    Lymphocytes Relative 51 (*)    All other components within normal limits  PRO B NATRIURETIC PEPTIDE - Abnormal; Notable for the following:    Pro B Natriuretic peptide (BNP) 374.4 (*)    All other components within normal limits  I-STAT TROPOININ, ED   Imaging Review Dg Chest Portable 1 View  04/04/2013   CLINICAL DATA:  Shortness of breath  EXAM: PORTABLE CHEST - 1 VIEW  COMPARISON:  03/30/2012  FINDINGS: Interstitial prominence, including Kerley B-lines. There is likely a small right effusion. Generous heart size, although strictly within normal limits. No pneumothorax. No acute osseous findings.  IMPRESSION: Pulmonary edema.   Electronically Signed   By: Jorje Guild M.D.   On: 04/04/2013 06:48     EKG Interpretation   Date/Time:  Monday April 04 2013 06:21:59 EST Ventricular Rate:  101 PR Interval:  172 QRS Duration: 91 QT Interval:  365 QTC Calculation: 473 R Axis:   52 Text Interpretation:  Sinus tachycardia Probable left atrial enlargement  Anterior infarct, old Borderline repolarization abnormality Confirmed by  Christy Gentles  MD, [place] Hudman ([id number]) on 04/04/2013 6:25:47 AM      MDM   Final diagnoses:  Acute CHF  Acute pulmonary edema    Nursing notes including past medical history and social history reviewed and considered in documentation xrays reviewed and considered Labs/vital reviewed and considered     Sharyon Cable, MD 04/04/13 0800

## 2013-04-04 NOTE — Care Management Note (Signed)
Page 1 of 2   04/20/2013     4:01:05 PM   CARE MANAGEMENT NOTE 04/20/2013  Patient:  Leonard [name], Leonard [name]   Account Number:  [id number]  Date Initiated:  04/04/2013  Documentation initiated by:  HUTCHINSON,CRYSTAL  Subjective/Objective Assessment:   Admitted with Acute CHF, Shortness of breath on Cpap requiring Bipap in ED     Action/Plan:   CM to follow for disposition needs   Anticipated DC Date:  04/20/2013   Anticipated DC Plan:  [place] referral  Clinical Social Worker      DC Planning Services  CM consult      Choice offered to / List presented to:             Status of service:  Completed, signed off Medicare Important Message given?   (If response is "NO", the following Medicare IM given date fields will be blank) Date Medicare IM given:   Date Additional Medicare IM given:    Discharge Disposition:  [place]  Per UR Regulation:  Reviewed for med. necessity/level of care/duration of stay  If discussed at [place] of Stay Meetings, dates discussed:   04/12/2013  04/14/2013  04/19/2013    Comments:  04/20/13 Almyra Free Lillieanna Tuohy,RN,BSN [phone] PT DISCHARGING HOME TODAY WITH ASSIST OF FAMILY AND FRIENDS, AND [facility].  SPOKE WITH HIS FRIEND ANN, WHO STATES SHE CAN HELP HIM OUT AS WELL.  START OF CARE FOR HH 24-48H POST DC DATE.  04/18/13 Chenee Munns,RN,BSN [phone] MET WITH PT, WHO WANTS TO GO HOME, AND IS REFUSING SNF.  PT IS COMPETENT TO MAKE OWN DECISIONS, BUT FAMILY, MAINLY HIS SISTERS WANT HIM TO GO TO SNF.  HE DOES NOT WANT TO DO THIS.  I HAVE CONFIRMED BY PHONE WITH HIS BEST FRIEND WILLIAM, THAT HE CAN COME AND ASSIT PT DAILY FOR AS LONG AS HE NEEDS HIM.  WE CAN ALSO ARRANGE HHRN TO FOLLOW UP WITH HH AIDE.  PT STATES HE WILL ASK HIS NEPHEW TO STAY WITH HIM AT NIGHT FOR THE FIRST WEEK HOME.  I THINK, AS WELL AS PHYSICAL THERAPY, THAT PT IS SAFE TO GO HOME WITH FRIENDS/FAMILY'S ASSIST AND HH CARE AS  ARRANGED.  HE CAN MAKE HIS OWN DECISIONS, AND CLEARLY HAS MADE THIS ONE.  04/15/13 Emoni Whitworth,RN,BSN [phone] SPOKE WITH PT'S SISTER JOYCE ELLIOTT ([phone]) BY PHONE TO DISCUSS DC PLANS:  PT HAS STATED THAT HIS SISTER AND NEPHEW, TERRY AND BETTY WILL BE AVAILABLE TO CARE FOR HIM AT DC.  SISTER STATES THIS IS NOT TRUE--BETTY HAS HAD A HEART ATTACK THIS WEEK WHILE PT HAS BEEN IN HOSP, REQUIRED SURGERY.  SISTER STATES NEPHEW LIVES ACROSS THE STREET AND IS UNRELIABLE.  PT/OT RECOMMENDING HH WITH 24HR CARE, BUT IF HE DOES NOT HAVE 24H CARE WILL NEED ST-SNF PLACEMENT, AS CIR HAS DECLINED ADMISSION.  WILL RECONSULT CSW TO INVESTIGATE POSSIBLITY OF SNF.  WILL FOLLOW PROGRESS.  04-11-13 5:14pm Luz Lex, RNBSN [phone] Talked with patient and states does live with sister and sister will be home with him.  Will need to talk with sister to confirm.  04-07-13 2pm Luz Lex, RNBSN [phone] Post op CABG x3 today.   Will need PT/OT to eval as according to prior note sister not able to be with him 24/7 on dc.  SW consult placed.  04/05/2013 Social:  From Home with sister 62yo  who is preparing for cardiac procedure  and will not be able to provide care at d/c home.   Other sister requesting d/c plan to include possible SNF to transition home until care can be established again. Hx/o past HHS with [facility]. PT Recs:  None DME Recs:  None Hx/o CXR:  Chest xray and clinical picture consistent with acute pulmonary edema - IV Lasix Cath procedure 04/05/2013 - pending O2 Sats 85% on 3/2 - May need oxygen at time of d/c. Diabetic Osteomyelitis; Recent amputation of right 4th and 5th toes Recommend continued follow-up by home health for dressing change assistance and pt also follow-up with a podiatrist or ortho physician for continued assessment since this wound is high risk to possibly develop infection at the site. Disposition Plan:  HHS:  RN - CHF, Wound Care, Med mgmt. [facility] notified) v SNF (TBD) ADD:  +2-3 Crystal Hutchinson RN, BNS, MSHL, CCM 04/05/2013   04/04/2013 Social:  From Home PT Recs:  Pending CXR:  Chest xray and clinical picture consistent with acute pulmonary edema Lasix 80 tid ADD: +3 Crystal Hutchinson RN, BNS, MSHL, CCM 04/04/2013

## 2013-04-04 NOTE — H&P (Signed)
FMTS Attending Admit Note  Patient seen and examined by me, discussed with resident team and I agree with Dr Jonathon Jordan assessment and plan.  Patient is admitted with complaint of sudden-onset dyspnea withotu chest pain that awoke him from sleep at 0400am this morning.  No chest pain or pressure, no diaphoresis, no radiating pain/pressure in arms or jaw, no nausea.  He has had a CXR that is significant for pulmonary edema (portable) and has responded clinically to diuresis.  He remarks that his dyspnea is markedly improved from his initial presentation, although he is still mildly dyspneic.    He appears comfortable, speaking fluidly in full sentences, no apparent distress.  HEENT Neck supple. No JVD noted on exam. No cervical adenopathy.  COR Regular S1S2 PULM Bibasilar crackles appreciated.  ABD soft, nontender EXTS: 1-2+ bilateral LE edema noted.   Assess/Plan: Patient with sudden-onset pulmonary edema clinically and by CXR.  Initial ECG does not suggest acute coronary syndrome.  Plan to cycle troponins and order ECHO.  Agree with Dr Lacinda Axon in calling Cardiology for consult. Continue aggressive diuresis and follow clinically.  To continue his ACEI and BB, ASA.  To order CRP and ESR in setting of recent osteomyelitis.  Leonard Mayotte, MD

## 2013-04-04 NOTE — Progress Notes (Signed)
Pt a/o, no c/o pain, pt on IV lasix for diuresing, heart failure booklet given and education started, pt stable

## 2013-04-04 NOTE — Evaluation (Signed)
Physical Therapy Evaluation/ Discharge Patient Details Name: [name] MRN: [id number] DOB: 1950/07/02 Today's Date: 04/04/2013 Time: 1211-1232 PT Time Calculation (min): 21 min  PT Assessment / Plan / Recommendation History of Present Illness  Leonard [name] is a 63 y.o. male presenting with acute pulmonary edema/volume overlead.  PMH is significant for uncontrolled DM-2, Anemia, HTN, HLD, PAD s/p angioplasty of superficial femoral artery, recent osteomyelitis (right foot).  Clinical Impression  Pt very pleasant and mobilizing well. Pt educated for pursed lip breathing, energy conservation and bil LE HEP to maintain strength and function acutely. Pt with assist of sister for household chores and currently moving at baseline function other than some desaturation with mobility. Pt encouraged to continue gait and have nursing check sats periodically. No further therapy needs at this time, education complete, pt aware and agreeable.     PT Assessment  Patent does not need any further PT services    Follow Up Recommendations  No PT follow up    Does the patient have the potential to tolerate intense rehabilitation      Barriers to Discharge        Equipment Recommendations  None recommended by PT    Recommendations for Other Services     Frequency      Precautions / Restrictions Precautions Precautions: None   Pertinent Vitals/Pain HR 93 with gait sats 94% on RA at rest and during first 150' 89-93% with increased distance sats dropped to 85% at 250' and on 3L sats 90-97% No pain      Mobility  Bed Mobility Overal bed mobility: Modified Independent Transfers Overall transfer level: Modified independent Ambulation/Gait Ambulation/Gait assistance: Modified independent (Device/Increase time) Ambulation Distance (Feet): 500 Feet Assistive device: None Gait Pattern/deviations: WFL(Within Functional Limits) Gait velocity interpretation: at or above normal speed for  age/gender General Gait Details: pt with cues for breathing technique and energy conservation throughout to maintain O2 sats    Exercises General Exercises - Lower Extremity Long Arc Quad: AROM;Seated;Both;10 reps Hip Flexion/Marching: AROM;Seated;Both;10 reps   PT Diagnosis:    PT Problem List:   PT Treatment Interventions:       PT Goals(Current goals can be found in the care plan section) Acute Rehab PT Goals PT Goal Formulation: No goals set, d/c therapy  Visit Information  Last PT Received On: 04/04/13 Assistance Needed: +1 History of Present Illness: Leonard [name] is a 63 y.o. male presenting with acute pulmonary edema/volume overlead.  PMH is significant for uncontrolled DM-2, Anemia, HTN, HLD, PAD s/p angioplasty of superficial femoral artery, recent osteomyelitis (right foot).       Prior [place] expects to be discharged to:: Private residence Living Arrangements: Other relatives Available Help at Discharge: Family;Available 24 hours/day Type of Home: Apartment Home Access: Level entry Home Layout: One level Home Equipment: None Prior Function Level of Independence: Independent Communication Communication: No difficulties    Cognition  Cognition Arousal/Alertness: Awake/alert Behavior During Therapy: WFL for tasks assessed/performed Overall Cognitive Status: Within Functional Limits for tasks assessed    Extremity/Trunk Assessment Upper Extremity Assessment Upper Extremity Assessment: Overall WFL for tasks assessed Lower Extremity Assessment Lower Extremity Assessment: Overall WFL for tasks assessed Cervical / Trunk Assessment Cervical / Trunk Assessment: Normal   Balance    End of Session PT - End of Session Equipment Utilized During Treatment: Gait belt;Oxygen Activity Tolerance: Patient tolerated treatment well Patient left: in chair;with call bell/phone within reach Nurse Communication: Mobility status  GP  Lanetta Inch Beth 04/04/2013, 12:33 PM Elwyn Reach, [place]

## 2013-04-04 NOTE — Progress Notes (Signed)
ANTICOAGULATION CONSULT NOTE - Initial Consult  Pharmacy Consult for heparin Indication: chest pain/ACS  No Known Allergies  Patient Measurements: Height: 5\' 8"  (172.7 cm) Weight: 231 lb 8 oz (105.008 kg) (scale b) IBW/kg (Calculated) : 68.4 Heparin Dosing Weight: 95kg  Vital Signs: Temp: 98.1 F (36.7 C) (03/02 2040) Temp src: Oral (03/02 2040) BP: 140/75 mmHg (03/02 2040) Pulse Rate: 80 (03/02 2040)  Labs:  Recent Labs  04/04/13 0625 04/04/13 1035 04/04/13 1501 04/04/13 2200  HGB 9.8* 9.1*  --   --   HCT 30.5* 28.1*  --   --   PLT 148* 144*  --   --   CREATININE 0.85 0.88  --   --   TROPONINI  --  <0.30 <0.30 0.38*    Estimated Creatinine Clearance: 103.5 ml/min (by C-G formula based on Cr of 0.88).   Medical History: Past Medical History  Diagnosis Date  . Hypertension   . PAD (peripheral artery disease)   . Gangrene of toe     right 5th/notes 01/04/2013   . High cholesterol   . Type II diabetes mellitus     Medications:  Prescriptions prior to admission  Medication Sig Dispense Refill  . amLODipine (NORVASC) 10 MG tablet Take 1 tablet (10 mg total) by mouth daily.  90 tablet  1  . atenolol (TENORMIN) 100 MG tablet Take 1 tablet (100 mg total) by mouth daily.  90 tablet  3  . cloNIDine (CATAPRES) 0.2 MG tablet Take 1 tablet (0.2 mg total) by mouth 2 (two) times daily.  90 tablet  1  . glipiZIDE (GLUCOTROL) 10 MG tablet Take 1 tablet (10 mg total) by mouth 2 (two) times daily before a meal.  90 tablet  1  . hydrochlorothiazide (MICROZIDE) 12.5 MG capsule Take 1 capsule (12.5 mg total) by mouth daily.  30 capsule  0  . insulin glargine (LANTUS) 100 units/mL SOLN Inject 30 Units into the skin 2 (two) times daily.  10 mL  0  . lisinopril (PRINIVIL,ZESTRIL) 40 MG tablet Take 1 tablet (40 mg total) by mouth daily.  30 tablet  3  . metFORMIN (GLUCOPHAGE) 1000 MG tablet Take 1 tablet (1,000 mg total) by mouth 2 (two) times daily with a meal.  60 tablet  6  .  nicotine (NICODERM CQ - DOSED IN MG/24 HR) 7 mg/24hr patch Place 1 patch (7 mg total) onto the skin daily.  28 patch  0  . pravastatin (PRAVACHOL) 40 MG tablet Take 1 tablet (40 mg total) by mouth daily.  90 tablet  1  . Blood Glucose Monitoring Suppl (ACCU-CHEK AVIVA) device Use as instructed  1 each  0  . glucose blood (ACCU-CHEK AVIVA) test strip Use as instructed  100 each  12  . Lancet Devices (ACCU-CHEK SOFTCLIX) lancets Use as instructed to test 4 times a day.  Dx code 250.00.  May change to fit patients meter  100 each  12  . Lancets (ACCU-CHEK SOFT TOUCH) lancets Use as instructed  100 each  12   Scheduled:  . [START ON 04/05/2013] aspirin EC  81 mg Oral Daily  . carvedilol  3.125 mg Oral BID WC  . cloNIDine  0.2 mg Oral BID  . furosemide  80 mg Intravenous TID  . insulin aspart  0-15 Units Subcutaneous TID WC  . insulin glargine  30 Units Subcutaneous BID  . isosorbide dinitrate  10 mg Oral TID  . lisinopril  40 mg Oral Daily  . simvastatin  20 mg Oral QHS  . sodium chloride  3 mL Intravenous Q12H    Assessment: 63yo male admitted this am for pulmonary edema/volume overload, troponins were being cycled and were negative until this pm, no c/o pain, to begin heparin empirically.  Goal of Therapy:  Heparin level 0.3-0.7 units/ml Monitor platelets by anticoagulation protocol: Yes   Plan:  Rec'd SQ heparin ~1.5hr ago; will begin heparin gtt at 1300 units/hr and monitor heparin levels and CBC.  Wynona Neat, PharmD, BCPS  04/04/2013,11:32 PM

## 2013-04-04 NOTE — ED Notes (Signed)
Per EMS, patient reports he went to sleep last night fine, and woke up at 4am this morning with shortness of breath. When EMS arrived, patient was found sitting on the toilet in the tripod position trying to breathe.  O2 saturation was 60-70% on high flow with fire department, and placed on CPAP by EMS.  Patient received 1 nitro with EMS.  He has a 20g in his left forearm.  Blood pressure was [id number] systolic with EMS. Patient is diabetic.

## 2013-04-05 ENCOUNTER — Other Ambulatory Visit: Payer: Self-pay | Admitting: *Deleted

## 2013-04-05 ENCOUNTER — Ambulatory Visit: Payer: Medicaid Other | Admitting: Vascular Surgery

## 2013-04-05 ENCOUNTER — Encounter (HOSPITAL_COMMUNITY)
Admission: EM | Disposition: A | Discharge status: Skilled Nursing Facility | Payer: Self-pay | Source: Home / Self Care | Attending: Cardiothoracic Surgery

## 2013-04-05 DIAGNOSIS — I70269 Atherosclerosis of native arteries of extremities with gangrene, unspecified extremity: Secondary | ICD-10-CM

## 2013-04-05 DIAGNOSIS — I739 Peripheral vascular disease, unspecified: Secondary | ICD-10-CM | POA: Diagnosis present

## 2013-04-05 DIAGNOSIS — I2510 Atherosclerotic heart disease of native coronary artery without angina pectoris: Secondary | ICD-10-CM

## 2013-04-05 DIAGNOSIS — I5031 Acute diastolic (congestive) heart failure: Secondary | ICD-10-CM

## 2013-04-05 DIAGNOSIS — I214 Non-ST elevation (NSTEMI) myocardial infarction: Secondary | ICD-10-CM | POA: Diagnosis present

## 2013-04-05 DIAGNOSIS — F17200 Nicotine dependence, unspecified, uncomplicated: Secondary | ICD-10-CM

## 2013-04-05 HISTORY — PX: LEFT HEART CATHETERIZATION WITH CORONARY ANGIOGRAM: SHX5451

## 2013-04-05 HISTORY — DX: Peripheral vascular disease, unspecified: I73.9

## 2013-04-05 LAB — SURGICAL PCR SCREEN
MRSA, PCR: NEGATIVE
Staphylococcus aureus: NEGATIVE

## 2013-04-05 LAB — GLUCOSE, CAPILLARY
GLUCOSE-CAPILLARY: 140 mg/dL — AB (ref 70–99)
GLUCOSE-CAPILLARY: 198 mg/dL — AB (ref 70–99)
Glucose-Capillary: 192 mg/dL — ABNORMAL HIGH (ref 70–99)
Glucose-Capillary: 204 mg/dL — ABNORMAL HIGH (ref 70–99)

## 2013-04-05 LAB — CBC
HCT: 29.7 % — ABNORMAL LOW (ref 39.0–52.0)
Hemoglobin: 9.4 g/dL — ABNORMAL LOW (ref 13.0–17.0)
MCH: 26 pg (ref 26.0–34.0)
MCHC: 31.6 g/dL (ref 30.0–36.0)
MCV: 82.3 fL (ref 78.0–100.0)
PLATELETS: 146 10*3/uL — AB (ref 150–400)
RBC: 3.61 MIL/uL — ABNORMAL LOW (ref 4.22–5.81)
RDW: 17.3 % — AB (ref 11.5–15.5)
WBC: 5.4 10*3/uL (ref 4.0–10.5)

## 2013-04-05 LAB — URINALYSIS, ROUTINE W REFLEX MICROSCOPIC
Bilirubin Urine: NEGATIVE
Glucose, UA: NEGATIVE mg/dL
Ketones, ur: NEGATIVE mg/dL
Leukocytes, UA: NEGATIVE
Nitrite: NEGATIVE
Protein, ur: 100 mg/dL — AB
Specific Gravity, Urine: 1.01 (ref 1.005–1.030)
Urobilinogen, UA: 0.2 mg/dL (ref 0.0–1.0)
pH: 6 (ref 5.0–8.0)

## 2013-04-05 LAB — TROPONIN I: TROPONIN I: 0.32 ng/mL — AB (ref <0.30)

## 2013-04-05 LAB — HEPARIN LEVEL (UNFRACTIONATED): HEPARIN UNFRACTIONATED: 0.33 [IU]/mL (ref 0.30–0.70)

## 2013-04-05 LAB — BASIC METABOLIC PANEL
BUN: 14 mg/dL (ref 6–23)
CHLORIDE: 105 meq/L (ref 96–112)
CO2: 30 mEq/L (ref 19–32)
Calcium: 9.1 mg/dL (ref 8.4–10.5)
Creatinine, Ser: 0.99 mg/dL (ref 0.50–1.35)
GFR calc non Af Amer: 87 mL/min — ABNORMAL LOW (ref >90)
Glucose, Bld: 142 mg/dL — ABNORMAL HIGH (ref 70–99)
POTASSIUM: 3.5 meq/L — AB (ref 3.7–5.3)
Sodium: 148 mEq/L — ABNORMAL HIGH (ref 137–147)

## 2013-04-05 LAB — URINE MICROSCOPIC-ADD ON

## 2013-04-05 LAB — PROTIME-INR
INR: 1.06 (ref 0.00–1.49)
PROTHROMBIN TIME: 13.6 s (ref 11.6–15.2)

## 2013-04-05 SURGERY — LEFT HEART CATHETERIZATION WITH CORONARY ANGIOGRAM
Anesthesia: LOCAL

## 2013-04-05 MED ORDER — INSULIN ASPART 100 UNIT/ML ~~LOC~~ SOLN
0.0000 [IU] | Freq: Three times a day (TID) | SUBCUTANEOUS | Status: DC
  Administered 2013-04-06: 4 [IU] via SUBCUTANEOUS
  Administered 2013-04-06 (×2): 11 [IU] via SUBCUTANEOUS

## 2013-04-05 MED ORDER — POTASSIUM CHLORIDE CRYS ER 20 MEQ PO TBCR
40.0000 meq | EXTENDED_RELEASE_TABLET | Freq: Once | ORAL | Status: AC
  Administered 2013-04-05: 40 meq via ORAL
  Filled 2013-04-05 (×2): qty 2

## 2013-04-05 MED ORDER — SODIUM CHLORIDE 0.9 % IV SOLN
INTRAVENOUS | Status: DC
  Administered 2013-04-05: 14:00:00 via INTRAVENOUS

## 2013-04-05 MED ORDER — BUDESONIDE-FORMOTEROL FUMARATE 160-4.5 MCG/ACT IN AERO
2.0000 | INHALATION_SPRAY | Freq: Two times a day (BID) | RESPIRATORY_TRACT | Status: DC
Start: 2013-04-05 — End: 2013-04-20
  Administered 2013-04-06 – 2013-04-19 (×23): 2 via RESPIRATORY_TRACT
  Filled 2013-04-05 (×3): qty 6

## 2013-04-05 MED ORDER — SODIUM CHLORIDE 0.9 % IV SOLN
1.0000 mL/kg/h | INTRAVENOUS | Status: AC
  Administered 2013-04-05: 1 mL/kg/h via INTRAVENOUS

## 2013-04-05 MED ORDER — VERAPAMIL HCL 2.5 MG/ML IV SOLN
INTRAVENOUS | Status: AC
  Filled 2013-04-05: qty 2

## 2013-04-05 MED ORDER — MORPHINE SULFATE 2 MG/ML IJ SOLN
2.0000 mg | INTRAMUSCULAR | Status: DC | PRN
  Administered 2013-04-05 – 2013-04-06 (×3): 2 mg via INTRAVENOUS
  Filled 2013-04-05 (×4): qty 1

## 2013-04-05 MED ORDER — NITROGLYCERIN 0.4 MG SL SUBL
SUBLINGUAL_TABLET | SUBLINGUAL | Status: AC
  Administered 2013-04-05: 20:00:00
  Administered 2013-04-05 (×2): 0.4 mg
  Filled 2013-04-05: qty 1

## 2013-04-05 MED ORDER — HEPARIN SODIUM (PORCINE) 1000 UNIT/ML IJ SOLN
INTRAMUSCULAR | Status: AC
  Filled 2013-04-05: qty 1

## 2013-04-05 MED ORDER — INSULIN GLARGINE 100 UNIT/ML ~~LOC~~ SOLN
35.0000 [IU] | Freq: Two times a day (BID) | SUBCUTANEOUS | Status: DC
  Administered 2013-04-05 – 2013-04-06 (×2): 35 [IU] via SUBCUTANEOUS
  Filled 2013-04-05 (×4): qty 0.35

## 2013-04-05 MED ORDER — NITROGLYCERIN 0.2 MG/ML ON CALL CATH LAB
INTRAVENOUS | Status: AC
  Filled 2013-04-05: qty 1

## 2013-04-05 MED ORDER — INSULIN ASPART 100 UNIT/ML ~~LOC~~ SOLN: 0.0000 [IU] | Freq: Every day | SUBCUTANEOUS | Status: DC

## 2013-04-05 MED ORDER — HEPARIN (PORCINE) IN NACL 100-0.45 UNIT/ML-% IJ SOLN
1850.0000 [IU]/h | INTRAMUSCULAR | Status: DC
  Administered 2013-04-05: 1350 [IU]/h via INTRAVENOUS
  Administered 2013-04-06: 1850 [IU]/h via INTRAVENOUS
  Administered 2013-04-06: 1600 [IU]/h via INTRAVENOUS
  Filled 2013-04-05 (×4): qty 250

## 2013-04-05 MED ORDER — FUROSEMIDE 40 MG PO TABS
40.0000 mg | ORAL_TABLET | Freq: Two times a day (BID) | ORAL | Status: DC
  Administered 2013-04-05 – 2013-04-06 (×3): 40 mg via ORAL
  Filled 2013-04-05 (×6): qty 1

## 2013-04-05 MED ORDER — CARVEDILOL 6.25 MG PO TABS
6.2500 mg | ORAL_TABLET | Freq: Two times a day (BID) | ORAL | Status: DC
  Administered 2013-04-05 – 2013-04-06 (×2): 6.25 mg via ORAL
  Filled 2013-04-05 (×4): qty 1

## 2013-04-05 MED ORDER — LIDOCAINE HCL (PF) 1 % IJ SOLN
INTRAMUSCULAR | Status: AC
  Filled 2013-04-05: qty 30

## 2013-04-05 MED ORDER — INSULIN ASPART 100 UNIT/ML ~~LOC~~ SOLN
3.0000 [IU] | Freq: Three times a day (TID) | SUBCUTANEOUS | Status: DC
  Administered 2013-04-06 (×2): 3 [IU] via SUBCUTANEOUS

## 2013-04-05 MED ORDER — MIDAZOLAM HCL 2 MG/2ML IJ SOLN
INTRAMUSCULAR | Status: AC
  Filled 2013-04-05: qty 2

## 2013-04-05 MED ORDER — DIAZEPAM 5 MG PO TABS
5.0000 mg | ORAL_TABLET | ORAL | Status: AC
  Administered 2013-04-05: 5 mg via ORAL
  Filled 2013-04-05: qty 1

## 2013-04-05 MED ORDER — FENTANYL CITRATE 0.05 MG/ML IJ SOLN
INTRAMUSCULAR | Status: AC
  Filled 2013-04-05: qty 2

## 2013-04-05 MED ORDER — ASPIRIN 81 MG PO CHEW: 81.0000 mg | CHEWABLE_TABLET | ORAL | Status: DC

## 2013-04-05 MED ORDER — HEPARIN (PORCINE) IN NACL 2-0.9 UNIT/ML-% IJ SOLN
INTRAMUSCULAR | Status: AC
  Filled 2013-04-05: qty 1000

## 2013-04-05 NOTE — Clinical Documentation Improvement (Signed)
2 Queries #1  Please Clarify TYPE & ACUITY of CHF Possible Clinical Conditions?  Acute Systolic Congestive Heart Failure Acute Diastolic Congestive Heart Failure Acute Systolic & Diastolic Congestive Heart Failure Acute on Chronic Systolic Congestive Heart Failure Acute on Chronic Diastolic Congestive Heart Failure Acute on Chronic Systolic & Diastolic Congestive Heart Failure Chronic Systolic Congestive Heart Failure Chronic Diastolic Congestive Heart Failure Chronic Systolic & Diastolic Congestive Heart Failure Other Condition Cannot Clinically Determine  Supporting Information:  Signs & Symptoms:(As per notes) " Acute pulmonary edema, Acute CHF"   #2 Possible Clinical Conditions?  Acute Respiratory Failure Chronic Respiratory Failure Acute on Chronic Respiratory Failure Other Condition Cannot Clinically Determine  Supporting Information: "SOB on arrival to ED and required CPAP. This was taken off and patient became very tachypneic again and was placed on BiPAP and given Lasix and Nitro"  Thank You, Alessandra Grout, RN, BSN, CCDS, Clinical Documentation Specialist:  [phone]   [phone]  [place] [facility]

## 2013-04-05 NOTE — Progress Notes (Signed)
04/05/13 0700-1900. Pt.is A/Ox4 and is ambulatory with 1 person standby assist. He is on oxygen at 2 L [place] and has continuous IV fluids running. Pt.had right radial cardiac cath.done today. Prior to cath.he was on heparin drip at 13.12mL/hr (1350 units). Heparin drip d/c on call to cath lab. Pt.returned to floor around 1533 with TR band in place. TR deflation was done as well as frequent vitals. No complications noted. Patient ambulated around in room with assistance. U/A and surgical PCR done.

## 2013-04-05 NOTE — Progress Notes (Signed)
ANTICOAGULATION CONSULT NOTE - Follow Up Consult  Pharmacy Consult for Heparin Indication: Multivessel CAD awaiting TCTS consult  No Known Allergies  Patient Measurements: Height: 5\' 8"  (172.7 cm) Weight: 218 lb 14.4 oz (99.292 kg) IBW/kg (Calculated) : 68.4 Heparin Dosing Weight: 90kg  Vital Signs: Temp: 97.9 F (36.6 C) (03/03 1206) Temp src: Oral (03/03 1206) BP: 126/70 mmHg (03/03 1615) Pulse Rate: 71 (03/03 1615)  Labs:  Recent Labs  04/04/13 0625 04/04/13 1035  04/04/13 2200 04/05/13 0158 04/05/13 0630 04/05/13 0800 04/05/13 1335  HGB 9.8* 9.1*  --   --   --   --  9.4*  --   HCT 30.5* 28.1*  --   --   --   --  29.7*  --   PLT 148* 144*  --   --   --   --  146*  --   LABPROT  --   --   --   --   --   --   --  13.6  INR  --   --   --   --   --   --   --  1.06  HEPARINUNFRC  --   --   --   --   --   --  0.33  --   CREATININE 0.85 0.88  --   --   --  0.99  --   --   TROPONINI  --  <0.30  < > 0.38* 0.32* <0.30  --   --   < > = values in this interval not displayed.  Estimated Creatinine Clearance: 89.6 ml/min (by C-G formula based on Cr of 0.99).  Assessment: 62yom s/p cath to restart heparin for multivessel CAD while awaiting TCTS consult for possible CABG. Per MD orders, restart heparin 8 hours post sheath pull (sheath pulled ~ 1515). Heparin drip had been slightly increased to 1350 units/hr prior to cath - will restart at previous rate. -  H/H and Plts low but stable - No significant bleeding reported, cath site stable per RN  Goal of Therapy:  Heparin level 0.3-0.7 units/ml Monitor platelets by anticoagulation protocol: Yes   Plan:  1. Restart heparin drip 1350 units/hr (13.5 ml/hr) tonight @ 2315 2. Follow-up daily heparin level and CBC 3. Follow-up TCTS recommendations  Earleen Newport [id number] 04/05/2013,4:20 PM

## 2013-04-05 NOTE — Evaluation (Signed)
Occupational Therapy Evaluation Patient Details Name: Leonard [name] MRN: [id number] DOB: 02-Apr-1950 Today's Date: 04/05/2013 Time: [id number] OT Time Calculation (min): 21 min  OT Assessment / Plan / Recommendation History of present illness Leonard [name] is a 63 y.o. male presenting with acute pulmonary edema/volume overlead.  PMH is significant for uncontrolled DM-2, Anemia, HTN, HLD, PAD s/p angioplasty of superficial femoral artery, recent osteomyelitis (right foot).   Clinical Impression   Pt is Mod I with ADLs and ADL mobility and no further acute OT services are indicated at this time. OT will sign off    OT Assessment  Patient does not need any further OT services    Follow Up Recommendations  No OT follow up    Barriers to Discharge  none    Equipment Recommendations  None recommended by OT    Recommendations for Other Services  none  Frequency       Precautions / Restrictions Precautions Precautions: None Restrictions Weight Bearing Restrictions: No   Pertinent Vitals/Pain No c/o pain    ADL  Grooming: Performed;Modified independent Where Assessed - Grooming: Unsupported standing Upper Body Bathing: Simulated;Modified independent Where Assessed - Upper Body Bathing: Unsupported sitting Lower Body Bathing: Simulated;Modified independent Where Assessed - Lower Body Bathing: Unsupported sitting;Unsupported standing Upper Body Dressing: Performed;Modified independent Where Assessed - Upper Body Dressing: Unsupported sitting Lower Body Dressing: Performed;Modified independent Where Assessed - Lower Body Dressing: Unsupported sitting;Unsupported standing Toilet Transfer: Performed;Modified independent Toilet Transfer Method: Sit to Loss adjuster, chartered: Regular height toilet Toileting - Clothing Manipulation and Hygiene: Performed;Modified independent Where Assessed - Toileting Clothing Manipulation and Hygiene: Standing Tub/Shower Transfer:  Performed;Simulated;Modified independent Tub/Shower Transfer Method: Therapist, art: Grab bars    OT Diagnosis:    OT Problem List:   OT Treatment Interventions:     OT Goals(Current goals can be found in the care plan section) Acute Rehab OT Goals Patient Stated Goal: to go home OT Goal Formulation: With patient  Visit Information  Last OT Received On: 04/05/13 Assistance Needed: +1 History of Present Illness: Leonard [name] is a 63 y.o. male presenting with acute pulmonary edema/volume overlead.  PMH is significant for uncontrolled DM-2, Anemia, HTN, HLD, PAD s/p angioplasty of superficial femoral artery, recent osteomyelitis (right foot).       Prior [place] expects to be discharged to:: Private residence Living Arrangements: Other relatives Available Help at Discharge: Family;Available 24 hours/day Type of Home: Apartment Home Access: Level entry Home Layout: One level Home Equipment: None Prior Function Level of Independence: Independent Communication Communication: No difficulties Dominant Hand: Right         Vision/Perception Vision - History Visual History: Cataracts Patient Visual Report: No change from baseline   Cognition  Cognition Arousal/Alertness: Awake/alert Behavior During Therapy: WFL for tasks assessed/performed Overall Cognitive Status: Within Functional Limits for tasks assessed    Extremity/Trunk Assessment Lower Extremity Assessment Lower Extremity Assessment: Defer to PT evaluation Cervical / Trunk Assessment Cervical / Trunk Assessment: Normal     Mobility Bed Mobility Overal bed mobility: Modified Independent Transfers Overall transfer level: Modified independent Equipment used: None          Balance Balance Overall balance assessment: No apparent balance deficits (not formally assessed)   End of Session OT - End of Session Activity Tolerance: Patient  tolerated treatment well Patient left: in bed  GO     Britt Bottom 04/05/2013, 2:36 PM

## 2013-04-05 NOTE — Progress Notes (Signed)
CRITICAL VALUE ALERT  Critical value received:  0.38 Troponin  Date of notification:  04/04/2013  Time of notification:  2300  Critical value read back: yes  Nurse who received alert:  Renelda Loma RN  MD notified (1st page):  Dr. Dianah Field   Time of first page:  2305  MD notified (2nd page):  Responding MD:  Dr. Dianah Field  Time MD responded:  2310

## 2013-04-05 NOTE — Progress Notes (Signed)
FMTS Attending Note Patient care discussed with resident team, and I agree with Dr Raeford Razor' assessment and plan for today.  Dalbert Mayotte, MD

## 2013-04-05 NOTE — Progress Notes (Signed)
ANTICOAGULATION CONSULT NOTE - Follow up [place] for heparin Indication: chest pain/ACS  No Known Allergies  Patient Measurements: Height: 5\' 8"  (172.7 cm) Weight: 218 lb 14.4 oz (99.292 kg) (b scale) IBW/kg (Calculated) : 68.4 Heparin Dosing Weight: 95kg  Vital Signs: Temp: 98.1 F (36.7 C) (03/03 0615) Temp src: Oral (03/03 0615) BP: 159/84 mmHg (03/03 0615) Pulse Rate: 80 (03/03 0615)  Labs:  Recent Labs  04/04/13 0625 04/04/13 1035  04/04/13 2200 04/05/13 0158 04/05/13 0630 04/05/13 0800  HGB 9.8* 9.1*  --   --   --   --  9.4*  HCT 30.5* 28.1*  --   --   --   --  29.7*  PLT 148* 144*  --   --   --   --  146*  HEPARINUNFRC  --   --   --   --   --   --  0.33  CREATININE 0.85 0.88  --   --   --  0.99  --   TROPONINI  --  <0.30  < > 0.38* 0.32* <0.30  --   < > = values in this interval not displayed.  Estimated Creatinine Clearance: 89.6 ml/min (by C-G formula based on Cr of 0.99).   Assessment: 63yo male admitted for pulmonary edema/volume overload. Troponins were mildly elevated this AM. Ruled NSTEMI. Awaiting cath. Currently on heparin infusion at 1300 units/hr. First heparin level was therapeutic at 0.33 but on lower end of goal range. CBC remains stable. Labs as noted above. No bleeding noted.   Goal of Therapy:  Heparin level 0.3-0.7 units/ml Monitor platelets by anticoagulation protocol: Yes   Plan:  1) Increase heparin infusion to 1350 units/hr 2) F/u confirmatory 6-hr level 3) Monitor daily HL, CBC and s/s of bleeding   Albertina Parr, PharmD.  Clinical Pharmacist Pager [phone]

## 2013-04-05 NOTE — Consult Note (Addendum)
WOC wound consult note Reason for Consult: Consult requested for right foot.  Pt had previous amputation of toes when surgery was performed in Dec and had wound vac to promote healing.  Wound to right outer foot is now 100% intact dry scar tissue without open wound, drainage, or fluctuance. Wound type: Right plantar foot has dry callous with loose scabbed skin which removes easily from area 2X2cm.  Remaining scab to callous site is .5X.5cm, 100% dry dark brown scab without fluctuance or drainage. No topical treatment to this site needed at this time. Discussed with pt the need to obtain regular appointments with podiatrist to trim callous area and avoid skin buildup and cracking. Measurement: New full thickness wound assessed that pt was not aware of, between right great toe and right second toe space. .5X.5X.3cm Wound bed: pink and moist, some slight odor, mod amt pink drainage. Periwound: grey, loose, macerated skin surrounding wound Dressing procedure/placement/frequency: Aquacel to absorb drainage and provide antimicrobial benefits. Pt states he had home health assistance for dressing changes in the past.  Recommend continued follow-up by home health for dressing change assistance and pt also follow-up with a podiatrist or ortho physician for continued assessment since this wound is high risk to possibly develop infection at the site. Please re-consult if further assistance is needed.  Thank-you,  Julien Girt MSN, [place], [place], [place], [place]

## 2013-04-05 NOTE — Interval H&P Note (Signed)
History and Physical Interval Note:  04/05/2013 2:38 PM  Leonard [name]  has presented today for surgery, with the diagnosis of cp  The various methods of treatment have been discussed with the patient and family. After consideration of risks, benefits and other options for treatment, the patient has consented to  Procedure(s): LEFT HEART CATHETERIZATION WITH CORONARY ANGIOGRAM (N/A) as a surgical intervention .  The patient's history has been reviewed, patient examined, no change in status, stable for surgery.  I have reviewed the patient's chart and labs.  Questions were answered to the patient's satisfaction.   Cath Lab Visit (complete for each Cath Lab visit)  Clinical Evaluation Leading to the Procedure:   ACS: yes  Non-ACS:    Anginal Classification: CCS II  Anti-ischemic medical therapy: Maximal Therapy (2 or more classes of medications)  Non-Invasive Test Results: No non-invasive testing performed  Prior CABG: No previous CABG        Leonard [name] [facility] 04/05/2013 2:38 PM

## 2013-04-05 NOTE — Progress Notes (Addendum)
    Subjective:  SOB improved, no chest pain.  Objective:  Vital Signs in the last 24 hours: Temp:  [97.4 F (36.3 C)-98.2 F (36.8 C)] 98.1 F (36.7 C) (03/03 0615) Pulse Rate:  [67-92] 80 (03/03 0615) Resp:  [18-20] 18 (03/03 0615) BP: (108-159)/(51-90) 159/84 mmHg (03/03 0615) SpO2:  [95 %-98 %] 97 % (03/03 0615) Weight:  [218 lb 14.4 oz (99.292 kg)-231 lb 8 oz (105.008 kg)] 218 lb 14.4 oz (99.292 kg) (03/03 0615)  Intake/Output from previous day:  Intake/Output Summary (Last 24 hours) at 04/05/13 0900 Last data filed at 04/05/13 [id number]  Gross per 24 hour  Intake      0 ml  Output   4250 ml  Net  -4250 ml    Physical Exam: General appearance: alert, cooperative, no distress and moderately obese Lungs: basilar crackles Lt base, overall decreased breath sounds Heart: regular rate and rhythm   Rate: 78  Rhythm: normal sinus rhythm  Lab Results:  Recent Labs  04/04/13 1035 04/05/13 0800  WBC 6.3 5.4  HGB 9.1* 9.4*  PLT 144* 146*    Recent Labs  04/04/13 0625 04/04/13 1035 04/05/13 0630  NA 141  --  148*  K 3.4*  --  3.5*  CL 102  --  105  CO2 23  --  30  GLUCOSE 274*  --  142*  BUN 16  --  14  CREATININE 0.85 0.88 0.99    Recent Labs  04/05/13 0158 04/05/13 0630  TROPONINI 0.32* <0.30   No results found for this basename: INR,  in the last 72 hours  Imaging: Imaging results have been reviewed  Cardiac Studies: ECHO 04/04/13: - Left ventricle: The cavity size was normal. Wall thickness was increased in a pattern of moderate LVH. Systolic function was normal. The estimated ejection fraction was in the range of 55% to 60%. Wall motion was normal; there were no regional wall motion abnormalities. Features are consistent with a pseudonormal left ventricular filling pattern, with concomitant abnormal relaxation and increased filling pressure (grade 2 diastolic dysfunction). - Mitral valve: Calcified annulus. - Left atrium: The atrium was mildly  dilated. - Pericardium, extracardiac: A trivial pericardial effusion was identified.   Assessment/Plan:   Active Problems:   Acute diastolic CHF (congestive heart failure)   NSTEMI (non-ST elevated myocardial infarction)   Diabetes mellitus, type 2   Essential hypertension, benign   Normocytic anemia   PVD - hx of Rt SFA PTA and s/p Rt 4-5th toe amp Dec 2014   Mixed hyperlipidemia   TOBACCO ABUSE   Acute pulmonary edema    PLAN:  He has diuresed > 5L. Mildly elevated Troponin. Will discuss timing of cath with MD. Consider backing off Lasix (currently 80 mg IV Q 8 hrs). Hold ACE and diuretic am of cath to avoid renal injury (he has had this in the past ).   Kerin Ransom PA-C Beeper [id number] 04/05/2013, 9:00 AM  -NPO -OK for cath today -Mildly elevated troponin (NSTEMI) with normal EF -KCL has been given -Able to lie flat without difficulty breathing.  -Will hold lasix prior to cath. -Discussed risks and benefits of cath, including CVA, MI death, bleeding. Radial approach would be reasonable. 2+ artery.  Candee Furbish, MD

## 2013-04-05 NOTE — Progress Notes (Signed)
Family Medicine Teaching Service Daily Progress Note Intern Pager: [phone]  [name] Medical record number: [id number] Date of birth: 16-Oct-1950 Age: 63 y.o. Gender: male  Primary Care Provider: Rosemarie Ax, MD Consultants: Cardiology   Code Status: Full   Pt Overview and Major Events to Date:  3/2: Admitted for pulmonary edema; Elevated troponin heparin drip started  3/3: Cath today   Assessment and Plan: RONNELL CLINGER is a 63 y.o. male presenting with acute pulmonary edema/volume overlead. PMH is significant for uncontrolled DM-2, Anemia, HTN, HLD, PAD s/p angioplasty of superficial femoral artery, recent osteomyelitis (right foot).   #Acute diastolic CHF/Acute Pulmonary Edema (acute respiratory failure) - Breathing well with 3L [place] - Cards recs:  Cath today - 2D echo: 55-60%, moderate LVH - Continuing home Lisinopril, Clonidine.  - D/c'd atenolol and started Coreg 6.25 mg BID  - holding norvasc due to edema.   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 Megan Salon regarding osteo  - WOC: continued f/u by home health dressing. Regular appt with podiatrist on outpt   - He has completed course of Bactrim  - CRP: <0.5 - ESR: 25 - Dr. Megan Salon (ID) to follow on D/c   #HTN: uncontrolled  - Continuing home Lisinopril, Clonidine. Holding Beta blocker due to acute decompensation.   #HLD: stable  - Continuing statin (using hospital formulary.   FEN/GI: Heart Healthy, Carb modified diet. SL IV.  Prophylaxis: Heparin SQ  Disposition: pending improvement   Subjective: Patient had no overnight events. He is feeling better  today.   Objective: Temp:  [97.4 F (36.3 C)-98.2 F (36.8 C)] 98.1 F (36.7 C) (03/03 0615) Pulse Rate:  [67-92] 80 (03/03 0615) Resp:  [18-20] 18 (03/03 0615) BP: (108-159)/(51-90) 159/84 mmHg (03/03 0615) SpO2:  [95 %-98 %] 97 % (03/03 0615) Weight:  [218 lb 14.4 oz (99.292 kg)-231 lb 8 oz (105.008 kg)] 218 lb 14.4 oz (99.292 kg) (03/03 0615) Filed Weights   04/04/13 1100 04/05/13 0615  Weight: 231 lb 8 oz (105.008 kg) 218 lb 14.4 oz (99.292 kg)    Physical Exam: General: African American gentleman, resting in bed, using supplemental O2 ([place]), does not appear in any acute distress.  HEENT: NCAT. No scleral icterus. MMM.  Cardiovascular: RRR. No murmurs, rubs, or gallops appreciated.  Respiratory: Diffuse crackles and scattered wheezing noted.  Abdomen: protuberant, soft, nontender. No palpable organomegaly.  Extremities: 3+ pitting LE edema bilaterally Skin: Right foot - callus noted at first MTP. Healing wound located on the lateral aspect of his right foot (patient with amputation of right 4th and 5th toe amputation).  Neuro: AO x 3. No focal deficits.   Laboratory:  Recent Labs Lab 04/04/13 0625 04/04/13 1035 04/05/13 0800  WBC 6.4 6.3 5.4  HGB 9.8* 9.1* 9.4*  HCT 30.5* 28.1* 29.7*  PLT 148* 144* 146*    Recent Labs Lab 04/04/13 0625 04/04/13 1035  NA 141  --   K 3.4*  --   CL 102  --   CO2 23  --   BUN 16  --  CREATININE 0.85 0.88  CALCIUM 9.0  --   GLUCOSE 274*  --     Recent Labs Lab 04/04/13 1052 04/04/13 1607 04/04/13 2112 04/05/13 0610  GLUCAP 221* 182* 148* 140*     Recent Labs Lab 04/04/13 1035 04/04/13 1501 04/04/13 2200 04/05/13 0158  TROPONINI <0.30 <0.30 0.38* 0.32*     Imaging/Diagnostic Tests:   Rosemarie Ax, MD 04/05/2013, 7:22 AM PGY-1, [facility] Intern pager: [phone], text pages welcome

## 2013-04-05 NOTE — Progress Notes (Addendum)
The patient's troponin levels were 0.38 and 0.32 during the night.  He denied any chest pain and does not have any outward signs of bleeding from the heparin drip.  The RN will continue to monitor the patient.

## 2013-04-05 NOTE — H&P (View-Only) (Signed)
    Subjective:  SOB improved, no chest pain.  Objective:  Vital Signs in the last 24 hours: Temp:  [97.4 F (36.3 C)-98.2 F (36.8 C)] 98.1 F (36.7 C) (03/03 0615) Pulse Rate:  [67-92] 80 (03/03 0615) Resp:  [18-20] 18 (03/03 0615) BP: (108-159)/(51-90) 159/84 mmHg (03/03 0615) SpO2:  [95 %-98 %] 97 % (03/03 0615) Weight:  [218 lb 14.4 oz (99.292 kg)-231 lb 8 oz (105.008 kg)] 218 lb 14.4 oz (99.292 kg) (03/03 0615)  Intake/Output from previous day:  Intake/Output Summary (Last 24 hours) at 04/05/13 0900 Last data filed at 04/05/13 [id number]  Gross per 24 hour  Intake      0 ml  Output   4250 ml  Net  -4250 ml    Physical Exam: General appearance: alert, cooperative, no distress and moderately obese Lungs: basilar crackles Lt base, overall decreased breath sounds Heart: regular rate and rhythm   Rate: 78  Rhythm: normal sinus rhythm  Lab Results:  Recent Labs  04/04/13 1035 04/05/13 0800  WBC 6.3 5.4  HGB 9.1* 9.4*  PLT 144* 146*    Recent Labs  04/04/13 0625 04/04/13 1035 04/05/13 0630  NA 141  --  148*  K 3.4*  --  3.5*  CL 102  --  105  CO2 23  --  30  GLUCOSE 274*  --  142*  BUN 16  --  14  CREATININE 0.85 0.88 0.99    Recent Labs  04/05/13 0158 04/05/13 0630  TROPONINI 0.32* <0.30   No results found for this basename: INR,  in the last 72 hours  Imaging: Imaging results have been reviewed  Cardiac Studies: ECHO 04/04/13: - Left ventricle: The cavity size was normal. Wall thickness was increased in a pattern of moderate LVH. Systolic function was normal. The estimated ejection fraction was in the range of 55% to 60%. Wall motion was normal; there were no regional wall motion abnormalities. Features are consistent with a pseudonormal left ventricular filling pattern, with concomitant abnormal relaxation and increased filling pressure (grade 2 diastolic dysfunction). - Mitral valve: Calcified annulus. - Left atrium: The atrium was mildly  dilated. - Pericardium, extracardiac: A trivial pericardial effusion was identified.   Assessment/Plan:   Active Problems:   Acute diastolic CHF (congestive heart failure)   NSTEMI (non-ST elevated myocardial infarction)   Diabetes mellitus, type 2   Essential hypertension, benign   Normocytic anemia   PVD - hx of Rt SFA PTA and s/p Rt 4-5th toe amp Dec 2014   Mixed hyperlipidemia   TOBACCO ABUSE   Acute pulmonary edema    PLAN:  He has diuresed > 5L. Mildly elevated Troponin. Will discuss timing of cath with MD. Consider backing off Lasix (currently 80 mg IV Q 8 hrs). Hold ACE and diuretic am of cath to avoid renal injury (he has had this in the past ).   Kerin Ransom PA-C Beeper [id number] 04/05/2013, 9:00 AM  -NPO -OK for cath today -Mildly elevated troponin (NSTEMI) with normal EF -KCL has been given -Able to lie flat without difficulty breathing.  -Will hold lasix prior to cath. -Discussed risks and benefits of cath, including CVA, MI death, bleeding. Radial approach would be reasonable. 2+ artery.  Candee Furbish, MD

## 2013-04-05 NOTE — CV Procedure (Signed)
    Cardiac Catheterization Procedure Note  Name: Leonard [name] MRN: [id number] DOB: Jan 12, 1951  Procedure: Left Heart Cath, Selective Coronary Angiography, LV angiography  Indication: 63 yo BM with history of DM, HTN, and HL presents with acute diastolic CHF and NSTEMI. Know history of PAD.   Procedural Details: The right wrist was prepped, draped, and anesthetized with 1% lidocaine. Using the modified Seldinger technique, a 5 French sheath was introduced into the right radial artery. 3 mg of verapamil was administered through the sheath, weight-based unfractionated heparin was administered intravenously. Standard Judkins catheters were used for selective coronary angiography and left ventriculography. Catheter exchanges were performed over an exchange length guidewire. There were no immediate procedural complications. A TR band was used for radial hemostasis at the completion of the procedure.  The patient was transferred to the post catheterization recovery area for further monitoring.  Procedural Findings: Hemodynamics: AO 148/78 mean 106 mm Hg LV 144/12 mm Hg  Coronary angiography: Coronary dominance: right  Left mainstem: Calcified without significant disease.  Left anterior descending (LAD): Heavily calcified in the proximal vessel. The LAD is very tortuous in the mid vessel with a 90-95% stenosis in the mid vessel.   [place] intermediate: no significant stenosis  Left circumflex (LCx): 70-80% proximal  Right coronary artery (RCA): 100% occlusion proximally with extensive bridging collaterals.   Left ventriculography: Left ventricular systolic function is normal, LVEF is estimated at 55-65%, there is no significant mitral regurgitation   Final Conclusions:   1. 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  2. Normal LV function.  Recommendations: Would consider CABG as best  option for revascularization. Will consult CT surgery.  Proctor Carriker [place] MD,FACC  04/05/2013, 3:17 PM

## 2013-04-06 ENCOUNTER — Inpatient Hospital Stay (HOSPITAL_COMMUNITY): Payer: Medicaid Other

## 2013-04-06 DIAGNOSIS — I2510 Atherosclerotic heart disease of native coronary artery without angina pectoris: Secondary | ICD-10-CM

## 2013-04-06 DIAGNOSIS — L97509 Non-pressure chronic ulcer of other part of unspecified foot with unspecified severity: Secondary | ICD-10-CM

## 2013-04-06 DIAGNOSIS — J9621 Acute and chronic respiratory failure with hypoxia: Secondary | ICD-10-CM

## 2013-04-06 HISTORY — DX: Atherosclerotic heart disease of native coronary artery without angina pectoris: I25.10

## 2013-04-06 LAB — PULMONARY FUNCTION TEST
DL/VA % pred: 97 %
DL/VA: 4.37 ml/min/mmHg/L
DLCO cor % pred: 54 %
DLCO cor: 16.06 ml/min/mmHg
DLCO unc % pred: 43 %
DLCO unc: 12.79 ml/min/mmHg
FEF 25-75 Post: 0.27 L/sec
FEF 25-75 Pre: 0.61 L/sec
FEF2575-%Change-Post: -55 %
FEF2575-%Pred-Post: 10 %
FEF2575-%Pred-Pre: 22 %
FEV1-%Change-Post: -4 %
FEV1-%Pred-Post: 44 %
FEV1-%Pred-Pre: 47 %
FEV1-Post: 1.28 L
FEV1-Pre: 1.34 L
FEV1FVC-%Change-Post: 1 %
FEV1FVC-%Pred-Pre: 59 %
FEV6-%Change-Post: -20 %
FEV6-%Pred-Post: 63 %
FEV6-%Pred-Pre: 79 %
FEV6-Post: 2.24 L
FEV6-Pre: 2.8 L
FEV6FVC-%Change-Post: -11 %
FEV6FVC-%Pred-Post: 90 %
FEV6FVC-%Pred-Pre: 102 %
FVC-%Change-Post: -6 %
FVC-%Pred-Post: 72 %
FVC-%Pred-Pre: 77 %
FVC-Post: 2.69 L
FVC-Pre: 2.87 L
Post FEV1/FVC ratio: 48 %
Post FEV6/FVC ratio: 87 %
Pre FEV1/FVC ratio: 47 %
Pre FEV6/FVC Ratio: 98 %
RV % pred: 52 %
RV: 1.14 L
TLC % pred: 55 %
TLC: 3.66 L

## 2013-04-06 LAB — HEPARIN LEVEL (UNFRACTIONATED)
Heparin Unfractionated: <0.1 IU/mL — ABNORMAL LOW (ref 0.30–0.70)
Heparin Unfractionated: 0.13 IU/mL — ABNORMAL LOW (ref 0.30–0.70)
Heparin Unfractionated: 0.34 IU/mL (ref 0.30–0.70)

## 2013-04-06 LAB — BLOOD GAS, ARTERIAL
Acid-Base Excess: 1.7 mmol/L (ref 0.0–2.0)
Bicarbonate: 25.2 mEq/L — ABNORMAL HIGH (ref 20.0–24.0)
DRAWN BY: 24485
O2 SAT: 93 %
PCO2 ART: 36.2 mmHg (ref 35.0–45.0)
Patient temperature: 98.6
TCO2: 26.3 mmol/L (ref 0–100)
pH, Arterial: 7.457 — ABNORMAL HIGH (ref 7.350–7.450)
pO2, Arterial: 65.1 mmHg — ABNORMAL LOW (ref 80.0–100.0)

## 2013-04-06 LAB — URINE CULTURE
Colony Count: NO GROWTH
Culture: NO GROWTH
Special Requests: NORMAL

## 2013-04-06 LAB — CBC
HEMATOCRIT: 28.3 % — AB (ref 39.0–52.0)
Hemoglobin: 9 g/dL — ABNORMAL LOW (ref 13.0–17.0)
MCH: 26.2 pg (ref 26.0–34.0)
MCHC: 31.8 g/dL (ref 30.0–36.0)
MCV: 82.3 fL (ref 78.0–100.0)
PLATELETS: 150 10*3/uL (ref 150–400)
RBC: 3.44 MIL/uL — AB (ref 4.22–5.81)
RDW: 17 % — ABNORMAL HIGH (ref 11.5–15.5)
WBC: 3.7 10*3/uL — AB (ref 4.0–10.5)

## 2013-04-06 LAB — COMPREHENSIVE METABOLIC PANEL
ALT: 27 U/L (ref 0–53)
AST: 20 U/L (ref 0–37)
Albumin: 3.6 g/dL (ref 3.5–5.2)
Alkaline Phosphatase: 91 U/L (ref 39–117)
BUN: 10 mg/dL (ref 6–23)
CO2: 25 mEq/L (ref 19–32)
Calcium: 8.8 mg/dL (ref 8.4–10.5)
Chloride: 102 mEq/L (ref 96–112)
Creatinine, Ser: 0.78 mg/dL (ref 0.50–1.35)
GFR calc Af Amer: >90 mL/min (ref >90)
GFR calc non Af Amer: >90 mL/min (ref >90)
Glucose, Bld: 255 mg/dL — ABNORMAL HIGH (ref 70–99)
Potassium: 3.5 mEq/L — ABNORMAL LOW (ref 3.7–5.3)
Sodium: 142 mEq/L (ref 137–147)
Total Bilirubin: 0.2 mg/dL — ABNORMAL LOW (ref 0.3–1.2)
Total Protein: 6.8 g/dL (ref 6.0–8.3)

## 2013-04-06 LAB — GLUCOSE, CAPILLARY
Glucose-Capillary: 262 mg/dL — ABNORMAL HIGH (ref 70–99)
Glucose-Capillary: 267 mg/dL — ABNORMAL HIGH (ref 70–99)
Glucose-Capillary: 178 mg/dL — ABNORMAL HIGH (ref 70–99)
Glucose-Capillary: 196 mg/dL — ABNORMAL HIGH (ref 70–99)

## 2013-04-06 LAB — PLATELET INHIBITION P2Y12: Platelet Function  P2Y12: 340 [PRU] (ref 194–418)

## 2013-04-06 LAB — HEMOGLOBIN A1C
Hgb A1c MFr Bld: 8.6 % — ABNORMAL HIGH (ref <5.7)
Mean Plasma Glucose: 200 mg/dL — ABNORMAL HIGH (ref <117)

## 2013-04-06 LAB — ABO/RH: ABO/RH(D): B NEG

## 2013-04-06 LAB — PREPARE RBC (CROSSMATCH)

## 2013-04-06 MED ORDER — SODIUM CHLORIDE 0.9 % IV SOLN
INTRAVENOUS | Status: AC
  Administered 2013-04-07: 14 mL/h via INTRAVENOUS
  Filled 2013-04-06: qty 40

## 2013-04-06 MED ORDER — EPINEPHRINE HCL 1 MG/ML IJ SOLN
0.5000 ug/min | INTRAMUSCULAR | Status: DC
  Filled 2013-04-06: qty 4

## 2013-04-06 MED ORDER — HEPARIN BOLUS VIA INFUSION
2000.0000 [IU] | Freq: Once | INTRAVENOUS | Status: AC
  Administered 2013-04-06: 2000 [IU] via INTRAVENOUS
  Filled 2013-04-06: qty 2000

## 2013-04-06 MED ORDER — SODIUM CHLORIDE 0.9 % IV SOLN
INTRAVENOUS | Status: AC
  Administered 2013-04-07: 3.8 [IU]/h via INTRAVENOUS
  Filled 2013-04-06: qty 1

## 2013-04-06 MED ORDER — TEMAZEPAM 15 MG PO CAPS: 15.0000 mg | ORAL_CAPSULE | Freq: Once | ORAL | Status: AC | PRN

## 2013-04-06 MED ORDER — CARVEDILOL 12.5 MG PO TABS
12.5000 mg | ORAL_TABLET | Freq: Two times a day (BID) | ORAL | Status: DC
  Administered 2013-04-06 – 2013-04-07 (×2): 12.5 mg via ORAL
  Filled 2013-04-06 (×4): qty 1

## 2013-04-06 MED ORDER — INSULIN GLARGINE 100 UNIT/ML ~~LOC~~ SOLN
40.0000 [IU] | Freq: Two times a day (BID) | SUBCUTANEOUS | Status: DC
  Administered 2013-04-06: 40 [IU] via SUBCUTANEOUS
  Filled 2013-04-06 (×3): qty 0.4

## 2013-04-06 MED ORDER — DEXMEDETOMIDINE HCL IN NACL 400 MCG/100ML IV SOLN
0.1000 ug/kg/h | INTRAVENOUS | Status: AC
  Administered 2013-04-07: 0.2 ug/kg/h via INTRAVENOUS
  Filled 2013-04-06: qty 100

## 2013-04-06 MED ORDER — SODIUM CHLORIDE 0.9 % IV SOLN
INTRAVENOUS | Status: DC
  Filled 2013-04-06: qty 30

## 2013-04-06 MED ORDER — POTASSIUM CHLORIDE 2 MEQ/ML IV SOLN
80.0000 meq | INTRAVENOUS | Status: DC
  Filled 2013-04-06: qty 40

## 2013-04-06 MED ORDER — FUROSEMIDE 10 MG/ML IJ SOLN
40.0000 mg | Freq: Once | INTRAMUSCULAR | Status: AC
  Administered 2013-04-06: 40 mg via INTRAVENOUS
  Filled 2013-04-06: qty 4

## 2013-04-06 MED ORDER — CHLORHEXIDINE GLUCONATE 4 % EX LIQD
60.0000 mL | Freq: Once | CUTANEOUS | Status: AC
  Administered 2013-04-07: 4 via TOPICAL
  Filled 2013-04-06 (×2): qty 60

## 2013-04-06 MED ORDER — DIAZEPAM 5 MG PO TABS
5.0000 mg | ORAL_TABLET | Freq: Once | ORAL | Status: AC
  Administered 2013-04-07: 5 mg via ORAL
  Filled 2013-04-06: qty 1

## 2013-04-06 MED ORDER — BISACODYL 5 MG PO TBEC
5.0000 mg | DELAYED_RELEASE_TABLET | Freq: Once | ORAL | Status: AC
  Administered 2013-04-06: 5 mg via ORAL
  Filled 2013-04-06: qty 1

## 2013-04-06 MED ORDER — NITROGLYCERIN IN D5W 200-5 MCG/ML-% IV SOLN
2.0000 ug/min | INTRAVENOUS | Status: AC
  Administered 2013-04-07: 5 ug/min via INTRAVENOUS
  Filled 2013-04-06: qty 250

## 2013-04-06 MED ORDER — PAPAVERINE HCL 30 MG/ML IJ SOLN
INTRAMUSCULAR | Status: AC
  Administered 2013-04-07: 09:00:00
  Filled 2013-04-06: qty 2.5

## 2013-04-06 MED ORDER — ALPRAZOLAM 0.25 MG PO TABS: 0.2500 mg | ORAL_TABLET | ORAL | Status: DC | PRN

## 2013-04-06 MED ORDER — VANCOMYCIN HCL 10 G IV SOLR
1250.0000 mg | INTRAVENOUS | Status: AC
  Administered 2013-04-07: 1250 mg via INTRAVENOUS
  Filled 2013-04-06 (×2): qty 1250

## 2013-04-06 MED ORDER — METOPROLOL TARTRATE 12.5 MG HALF TABLET
12.5000 mg | ORAL_TABLET | Freq: Once | ORAL | Status: AC
  Administered 2013-04-07: 12.5 mg via ORAL
  Filled 2013-04-06: qty 1

## 2013-04-06 MED ORDER — DOPAMINE-DEXTROSE 3.2-5 MG/ML-% IV SOLN
2.0000 ug/kg/min | INTRAVENOUS | Status: DC
  Filled 2013-04-06: qty 250

## 2013-04-06 MED ORDER — INSULIN ASPART 100 UNIT/ML ~~LOC~~ SOLN
6.0000 [IU] | Freq: Three times a day (TID) | SUBCUTANEOUS | Status: DC
  Administered 2013-04-06: 6 [IU] via SUBCUTANEOUS

## 2013-04-06 MED ORDER — MAGNESIUM SULFATE 50 % IJ SOLN
40.0000 meq | INTRAMUSCULAR | Status: DC
  Filled 2013-04-06: qty 10

## 2013-04-06 MED ORDER — DEXTROSE 5 % IV SOLN
1.5000 g | INTRAVENOUS | Status: AC
  Administered 2013-04-07: 1.5 g via INTRAVENOUS
  Administered 2013-04-07: .75 g via INTRAVENOUS
  Filled 2013-04-06: qty 1.5

## 2013-04-06 MED ORDER — DEXTROSE 5 % IV SOLN
750.0000 mg | INTRAVENOUS | Status: DC
  Filled 2013-04-06: qty 750

## 2013-04-06 MED ORDER — CHLORHEXIDINE GLUCONATE 4 % EX LIQD
60.0000 mL | Freq: Once | CUTANEOUS | Status: AC
  Administered 2013-04-06: 4 via TOPICAL
  Filled 2013-04-06: qty 60

## 2013-04-06 MED ORDER — DEXTROSE 5 % IV SOLN
30.0000 ug/min | INTRAVENOUS | Status: AC
  Administered 2013-04-07: 25 ug/min via INTRAVENOUS
  Filled 2013-04-06: qty 2

## 2013-04-06 MED ORDER — POTASSIUM CHLORIDE CRYS ER 20 MEQ PO TBCR
40.0000 meq | EXTENDED_RELEASE_TABLET | Freq: Once | ORAL | Status: AC
  Administered 2013-04-06: 40 meq via ORAL
  Filled 2013-04-06: qty 2

## 2013-04-06 MED ORDER — POTASSIUM CHLORIDE CRYS ER 20 MEQ PO TBCR
40.0000 meq | EXTENDED_RELEASE_TABLET | Freq: Once | ORAL | Status: AC
  Administered 2013-04-06: 40 meq via ORAL

## 2013-04-06 MED ORDER — ALBUTEROL SULFATE (2.5 MG/3ML) 0.083% IN NEBU
2.5000 mg | INHALATION_SOLUTION | Freq: Once | RESPIRATORY_TRACT | Status: AC
  Administered 2013-04-06: 2.5 mg via RESPIRATORY_TRACT

## 2013-04-06 NOTE — Progress Notes (Signed)
FMTS Attending Note Patient seen and examibned by me.  He denies any chest pain; has had improvement in dyspnea.   Appreciate Cardiothoracic and Cardiology consults.  Plan for CABG tomorrow. Dalbert Mayotte, MD

## 2013-04-06 NOTE — Progress Notes (Addendum)
RT performed IS with patient. Patient pulled 2265ml x10 on incentive spirometry. RT instructed patient on use and outcomes.

## 2013-04-06 NOTE — Progress Notes (Signed)
Pt with recurrent low level R sided CP. Pt rates CP 3-4/10. EKG = NSR. VS stable. Nitro SL x3 ineffective. MD came to pt's room to discuss need for CABG on Thursday. Orders received for Morphine PRN. Heparin drip started at 23:30 per post-cath protocol. MD recently paged again regarding issue. No need for further Troponins because last set was negative. MD instructed to continue with PRN Morphine. Will continue to monitor.

## 2013-04-06 NOTE — Progress Notes (Signed)
ANTICOAGULATION CONSULT NOTE - Follow Up Consult  Pharmacy Consult for Heparin  Indication: MVD awaiting TCTS consult  No Known Allergies  Patient Measurements: Height: 5\' 8"  (172.7 cm) Weight: 218 lb 14.4 oz (99.292 kg) IBW/kg (Calculated) : 68.4  Vital Signs: Temp: 97.8 F (36.6 C) (03/04 0220) Temp src: Oral (03/04 0220) BP: 188/88 mmHg (03/04 0220) Pulse Rate: 74 (03/04 0220)  Labs:  Recent Labs  04/04/13 1035  04/04/13 2200 04/05/13 0158 04/05/13 0630 04/05/13 0800 04/05/13 1335 04/06/13 0440  HGB 9.1*  --   --   --   --  9.4*  --  9.0*  HCT 28.1*  --   --   --   --  29.7*  --  28.3*  PLT 144*  --   --   --   --  146*  --  150  LABPROT  --   --   --   --   --   --  13.6  --   INR  --   --   --   --   --   --  1.06  --   HEPARINUNFRC  --   --   --   --   --  0.33  --  <0.10*  CREATININE 0.88  --   --   --  0.99  --   --  0.78  TROPONINI <0.30  < > 0.38* 0.32* <0.30  --   --   --   < > = values in this interval not displayed.  Estimated Creatinine Clearance: 110.8 ml/min (by C-G formula based on Cr of 0.78).   Medications:  Heparin 1350 units/hr  Assessment: 63 y/o M on heparin for MVD awaiting TCTS consult. HL is <0.1 this AM. No issues per RN.   Goal of Therapy:  Heparin level 0.3-0.7 units/ml Monitor platelets by anticoagulation protocol: Yes   Plan:  -Increase heparin drip to 1600 units/hr -1400 HL -Daily CBC/HL -Monitor for bleeding  Narda Bonds 04/06/2013,6:34 AM

## 2013-04-06 NOTE — Progress Notes (Signed)
0700-1900 Pt.A/Ox4 and is ambulatory with standby assist. He had no c/o pain during the shift. Pt.was gone for most of the am for various tests. Pharmacist called about patient's heparin level. Heparin level was increased from 16 ml/hr to (1600 units) to 18.5 ml/hr (1850 units), and 2000 unit bolus was also given. Patient was given one time dose of 40 mg of IV lasix today and NP was called to verify order. Dressing change to right great toe was done.

## 2013-04-06 NOTE — Consult Note (Signed)
[address]       [place] [address]             [phone]        Autumn E Gibas [place] Medical Record [id number] Date of Birth: 04-Oct-1950  Referring: No ref. provider found Primary Care: Rosemarie Ax, MD  Chief Complaint:    Chief Complaint  Patient presents with  . Shortness of Breath   patient examined, coronary angiogram in most recent 2-D echocardiogram reviewed  History of Present Illness:     63 year old diabetic smoker with dyslipidemia and no prior history of CAD presented to the emergency department with shortness of breath, some chest tightness and pulmonary edema on chest x-ray. Cardiac enzymes were initially negative. He was admitted the hospital for diuresis and cardiology consultation. Cardiac catheterization performed yesterday demonstrates severe multivessel CAD with preserved LV systolic function. Echocardiogram performed demonstrates no significant valvular disease with EF 55%. Because of his unstable angina, CHF, severe three-vessel CAD, and diabetic history CT surgical evaluation was requested.   The patient has history of peripheral vascular disease and has a right SFA stent and amputation of right toes. Current Activity/ Functional Status: Patient lives with his brother. He has a heavy smoking history. He is retired. He walks well.   Zubrod Score: At the time of surgery this patient's most appropriate activity status/level should be described as: []     0    Normal activity, no symptoms []     1    Restricted in physical strenuous activity but ambulatory, able to do out light work [x]     2    Ambulatory and capable of self care, unable to do work activities, up and about                 more than 50%  Of the time                            []     3    Only limited self care, in bed greater than 50% of waking hours []     4    Completely disabled, no self care, confined to bed or chair []     5    Moribund  Past Medical  History  Diagnosis Date  . Hypertension   . PAD (peripheral artery disease)   . Gangrene of toe     right 5th/notes 01/04/2013   . High cholesterol   . Type II diabetes mellitus     Past Surgical History  Procedure Laterality Date  . Throat surgery  1983    polyps  . Angioplasty / stenting femoral Right 01/13/2013    superficial femoral artery x 2 (3 mm x 60 mm, 4 mm x 60 mm)  . Tonsillectomy and adenoidectomy  ~ 1965  . Amputation Right 01/28/2013    Procedure: RAY AMPUTATION RIGHT  4th & 5th TOE;  Surgeon: Rosetta Posner, MD;  Location: [facility];  Service: Vascular;  Laterality: Right;    History  Smoking status  . Former Smoker -- 1.00 packs/day for 44 years  . Types: Cigarettes  Smokeless tobacco  . Never Used    History  Alcohol Use No    History   Social History  . Marital Status: Single    Spouse Name: N/A    Number of Children: N/A  . Years of Education: N/A   Occupational History  .  Not on file.   Social History Main Topics  . Smoking status: Former Smoker -- 1.00 packs/day for 44 years    Types: Cigarettes  . Smokeless tobacco: Never Used  . Alcohol Use: No  . Drug Use: No  . Sexual Activity: Not Currently   Other Topics Concern  . Not on file   Social History Narrative   Lives with sister, [name] in [place].  Retired - former Sports coach.    No Known Allergies  Current Facility-Administered Medications  Medication Dose Route Frequency Provider Last Rate Last Dose  . 0.9 %  sodium chloride infusion  250 mL Intravenous PRN Coral Spikes, DO      . acetaminophen (TYLENOL) tablet 650 mg  650 mg Oral Q6H PRN Coral Spikes, DO       Or  . acetaminophen (TYLENOL) suppository 650 mg  650 mg Rectal Q6H PRN Coral Spikes, DO      . aspirin EC tablet 81 mg  81 mg Oral Daily Coral Spikes, DO   81 mg at 04/05/13 1214  . budesonide-formoterol (SYMBICORT) 160-4.5 MCG/ACT inhaler 2 puff  2 puff Inhalation BID Ivin Poot, MD   2 puff at 04/06/13 0825   . carvedilol (COREG) tablet 6.25 mg  6.25 mg Oral BID WC Tawanna Sat, MD   6.25 mg at 04/06/13 0656  . cloNIDine (CATAPRES) tablet 0.2 mg  0.2 mg Oral BID Coral Spikes, DO   0.2 mg at 04/05/13 2002  . furosemide (LASIX) tablet 40 mg  40 mg Oral BID Antaniya Venuti M [place], MD   40 mg at 04/05/13 1809  . heparin ADULT infusion 100 units/mL (25000 units/250 mL)  1,600 Units/hr Intravenous Continuous Narda Bonds, RPH 16 mL/hr at 04/06/13 0656 1,600 Units/hr at 04/06/13 0656  . HYDROcodone-acetaminophen (NORCO/VICODIN) 5-325 MG per tablet 1-2 tablet  1-2 tablet Oral Q4H PRN Coral Spikes, DO   2 tablet at 04/05/13 1953  . insulin aspart (novoLOG) injection 0-20 Units  0-20 Units Subcutaneous TID WC Ivin Poot, MD   11 Units at 04/06/13 [phone]  . insulin aspart (novoLOG) injection 0-5 Units  0-5 Units Subcutaneous QHS Ivin Poot, MD      . insulin aspart (novoLOG) injection 3 Units  3 Units Subcutaneous TID WC Ivin Poot, MD      . insulin glargine (LANTUS) injection 35 Units  35 Units Subcutaneous BID Ivin Poot, MD   35 Units at 04/05/13 2324  . isosorbide dinitrate (ISORDIL) tablet 10 mg  10 mg Oral TID Josue Hector, MD   10 mg at 04/05/13 2229  . lisinopril (PRINIVIL,ZESTRIL) tablet 40 mg  40 mg Oral Daily Coral Spikes, DO   40 mg at 04/05/13 1214  . morphine 2 MG/ML injection 2 mg  2 mg Intravenous Q3H PRN Ivin Poot, MD   2 mg at 04/06/13 0216  . ondansetron (ZOFRAN) tablet 4 mg  4 mg Oral Q6H PRN Coral Spikes, DO       Or  . ondansetron (ZOFRAN) injection 4 mg  4 mg Intravenous Q6H PRN Coral Spikes, DO      . potassium chloride SA (K-DUR,KLOR-CON) CR tablet 40 mEq  40 mEq Oral Once Rosemarie Ax, MD      . simvastatin (ZOCOR) tablet 20 mg  20 mg Oral QHS Coral Spikes, DO   20 mg at 04/05/13 2229  . sodium chloride 0.9 % injection 3  mL  3 mL Intravenous Q12H Coral Spikes, DO   3 mL at 04/05/13 2230  . sodium chloride 0.9 % injection 3 mL  3 mL Intravenous PRN Coral Spikes,  DO        Prescriptions prior to admission  Medication Sig Dispense Refill  . amLODipine (NORVASC) 10 MG tablet Take 1 tablet (10 mg total) by mouth daily.  90 tablet  1  . atenolol (TENORMIN) 100 MG tablet Take 1 tablet (100 mg total) by mouth daily.  90 tablet  3  . cloNIDine (CATAPRES) 0.2 MG tablet Take 1 tablet (0.2 mg total) by mouth 2 (two) times daily.  90 tablet  1  . glipiZIDE (GLUCOTROL) 10 MG tablet Take 1 tablet (10 mg total) by mouth 2 (two) times daily before a meal.  90 tablet  1  . hydrochlorothiazide (MICROZIDE) 12.5 MG capsule Take 1 capsule (12.5 mg total) by mouth daily.  30 capsule  0  . insulin glargine (LANTUS) 100 units/mL SOLN Inject 30 Units into the skin 2 (two) times daily.  10 mL  0  . lisinopril (PRINIVIL,ZESTRIL) 40 MG tablet Take 1 tablet (40 mg total) by mouth daily.  30 tablet  3  . metFORMIN (GLUCOPHAGE) 1000 MG tablet Take 1 tablet (1,000 mg total) by mouth 2 (two) times daily with a meal.  60 tablet  6  . nicotine (NICODERM CQ - DOSED IN MG/24 HR) 7 mg/24hr patch Place 1 patch (7 mg total) onto the skin daily.  28 patch  0  . pravastatin (PRAVACHOL) 40 MG tablet Take 1 tablet (40 mg total) by mouth daily.  90 tablet  1  . Blood Glucose Monitoring Suppl (ACCU-CHEK AVIVA) device Use as instructed  1 each  0  . glucose blood (ACCU-CHEK AVIVA) test strip Use as instructed  100 each  12  . Lancet Devices (ACCU-CHEK SOFTCLIX) lancets Use as instructed to test 4 times a day.  Dx code 250.00.  May change to fit patients meter  100 each  12  . Lancets (ACCU-CHEK SOFT TOUCH) lancets Use as instructed  100 each  12    Family History  Problem Relation Age of Onset  . Cancer Mother     lung cancer  . Hypertension Mother   . Cancer Sister     patient thinks it was uterine cancer  . Hypertension Sister      Review of Systems:  During his hospitalization he has been hyperglycemic blood sugars greater than 250 The patient is right-hand dominant The patient  denies history of thoracic trauma or pneumothorax The patient states he stopped smoking 2 months ago after smoking for over 40 years He denies TIA or mini stroke He denies difficulty swallowing or dental symptoms No history of bleeding diathesis Is not taking Plavix for his femoral artery stent     Cardiac Review of Systems: Y or N  Chest Pain [  Y.  ]  Resting SOB [ YY  ] Exertional SOB  [  ]  Orthopnea [Y.  ]   Pedal Edema [ N.  ]    Palpitations [N.  ] Syncope  [N.  ]   Presyncope [ N.  ]  General Review of Systems: [Y] = yes [  ]=no Constitional: recent weight change [  ]; anorexia [  ]; fatigue [  ]; nausea [  ]; night sweats [  ]; fever [  ]; or chills [  ]  Dental: poor dentition[  ]; Last Dentist visit: Greater than one year   Eye : blurred vision [  ]; diplopia [   ]; vision changes [  ];  Amaurosis fugax[  ]; Resp: cough [  ];  wheezing[  ];  hemoptysis[  ]; shortness of breath[ Y. ]; paroxysmal nocturnal dyspnea[Y.  ]; dyspnea on exertion[ Y. ]; or orthopnea[  ];  GI:  gallstones[  ], vomiting[  ];  dysphagia[  ]; melena[  ];  hematochezia [  ]; heartburn[  ];   Hx of  Colonoscopy[  ]; GU: kidney stones [  ]; hematuria[  ];   dysuria [  ];  nocturia[  ];  history of     obstruction [  ]; urinary frequency [  ]             Skin: rash, swelling[  ];, hair loss[  ];  peripheral edema[  ];  or itching[  ]; Musculosketetal: myalgias[  ];  joint swelling[  ];  joint erythema[  ];  joint pain[  ];  back pain[  ];  Heme/Lymph: bruising[  ];  bleeding[  ];  anemia[  ];  Neuro: TIA[  ];  headaches[  ];  stroke[  ];  vertigo[  ];  seizures[  ];   paresthesias[  ];  difficulty walking[  ];  Psych:depression[  ]; anxiety[  ];  Endocrine: diabetes[Y.  ];  thyroid dysfunction[  ];  Immunizations: Flu [  ]; Pneumococcal[  ];  Other:  Physical Exam: BP 161/78  Pulse 72  Temp(Src) 97.9 F (36.6 C) (Oral)  Resp 20  Ht 5\' 8"  ([id number]  m)  Wt 217 lb 4.8 oz (98.567 kg)  BMI 33.05 kg/m2  SpO2 99%  General appearance-alert and responsive appears chronically ill HEENT-muddy sclera normocephalic dentition adequate Neck-no JVD mass or bruit Thorax-scattered rales Cardiac-regular rhythm without murmur or gallop Abdomen-soft without pulsatile mass Extremities-mild clubbing but no cyanosis or edema, compression dressing over right radial artery site Vascular-positive radial and femoral pulses no pedal pulses Neuro alert responsive no focal motor deficit   Diagnostic Studies & Laboratory data:     Recent Radiology Findings:   Dg Chest 2 View  04/06/2013   CLINICAL DATA:  Weakness, shortness of Breath  EXAM: CHEST  2 VIEW  COMPARISON:  04/04/2013  FINDINGS: Cardiomediastinal silhouette is stable. No segmental infiltrate or pleural effusion. No pulmonary edema. Mild left basilar atelectasis or infiltrate.  IMPRESSION: Mild left basilar atelectasis or infiltrate.  No pulmonary edema.   Electronically Signed   By: Lahoma Crocker M.D.   On: 04/06/2013 08:28      Recent Lab Findings: Lab Results  Component Value Date   WBC 3.7* 04/06/2013   HGB 9.0* 04/06/2013   HCT 28.3* 04/06/2013   PLT 150 04/06/2013   GLUCOSE 255* 04/06/2013   CHOL 147 11/25/2012   TRIG 400* 11/25/2012   HDL 27* 11/25/2012   LDLCALC 40 11/25/2012   ALT 27 04/06/2013   AST 20 04/06/2013   NA 142 04/06/2013   K 3.5* 04/06/2013   CL 102 04/06/2013   CREATININE 0.78 04/06/2013   BUN 10 04/06/2013   CO2 25 04/06/2013   TSH 3.384 11/23/2007   INR 1.06 04/05/2013   HGBA1C 8.6* 04/05/2013      Assessment / Plan:      1 severe three-vessel CAD with unstable angina and class for CHF 2 peripheral vascular disease Of right leg 3 poorly controlled diabetes  A1c 8.6 4 chronic anemia hematocrit 28-will need intraoperative transfusion  Plan multivessel CABG on March 5. Procedure discussed in detail with patient including indications benefits and potential risks-stroke, MI, arrhythmia,  blood transfusion or bleeding, postop pleural effusion or pneumonia, infection, death.      @ME1 @ 04/06/2013 8:31 AM

## 2013-04-06 NOTE — Progress Notes (Signed)
Family Medicine Teaching Service Daily Progress Note Intern Pager: [phone]  [name] Medical record number: [id number] Date of birth: 11/24/1950 Age: 63 y.o. Gender: male  Primary Care Provider: Rosemarie Ax, MD Consultants: Cardiology   Code Status: Full   Pt Overview and Major Events to Date:  3/2: Admitted for pulmonary edema; Elevated troponin heparin drip started  3/3: Cath - 3 vessel obstructive CAD,  3/4: CABG scheduled for March 5.    Assessment and Plan: Leonard [name] is a 63 y.o. male presenting with acute pulmonary edema/volume overlead. PMH is significant for uncontrolled DM-2, Anemia, HTN, HLD, PAD s/p angioplasty of superficial femoral artery, recent osteomyelitis (right foot).   #Acute diastolic CHF/Acute Pulmonary Edema (acute respiratory failure) - Breathing well with 2L [place]. 5.9 output since admission. 218>217 lbs.  - 2D echo: 55-60%, moderate LVH - Continuing home Lisinopril, Clonidine.  - Coreg 6.25 mg BID  - holding norvasc due to edema.   - IV Lasix 80 mg TID  - Daily weights: 231 lbs>218  # NSTEMI: chest pain overnight.  - Cards recs:  3 vessel obstructive CAD. CABG March 5.  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 Megan Salon regarding osteo  - WOC: continued f/u by home health dressing. Regular appt with podiatrist on outpt   - He has completed course of Bactrim  - CRP: <0.5 - ESR: 25 - Dr. Megan Salon (ID) to follow on D/c   #Hypokalemia:  - asymptomatic  - Kdue 40 mg x 1  - 3.5 this AM, replenish kdur 40 mEq   #HTN: uncontrolled  - Continuing home Lisinopril, Clonidine. Holding Beta blocker due to acute decompensation.   #HLD: stable  - Continuing statin (using hospital formulary.   FEN/GI: Heart Healthy, Carb  modified diet. SL IV.  Prophylaxis: Heparin SQ  Disposition: pending improvement   Subjective: patient had an episode of chest pain overnight. He was lying in bed and it begin.  Moving around helped alleviate the pain.  He didn't have any other complaints.   Objective: Temp:  [97.8 F (36.6 C)-97.9 F (36.6 C)] 97.9 F (36.6 C) (03/04 0650) Pulse Rate:  [70-86] 72 (03/04 0650) Resp:  [18-20] 20 (03/04 0650) BP: (126-188)/(70-89) 161/78 mmHg (03/04 0650) SpO2:  [92 %-99 %] 99 % (03/04 0650) Weight:  [217 lb 4.8 oz (98.567 kg)-218 lb 14.4 oz (99.292 kg)] 217 lb 4.8 oz (98.567 kg) (03/04 0650) Filed Weights   04/05/13 0615 04/05/13 1349 04/06/13 0650  Weight: 218 lb 14.4 oz (99.292 kg) 218 lb 14.4 oz (99.292 kg) 217 lb 4.8 oz (98.567 kg)    Physical Exam: General: African American gentleman, resting in bed, using supplemental O2 ([place]), does not appear in any acute distress.  HEENT: NCAT. No scleral icterus. MMM.  Cardiovascular: RRR. No murmurs, rubs, or gallops appreciated.  Respiratory: Diffuse crackles and scattered wheezing noted.  Abdomen: protuberant, soft, nontender. No palpable organomegaly.  Extremities: 2+ pitting LE edema bilaterally Skin: Right foot - callus noted at first MTP. Healing wound located on the lateral aspect of his right foot (patient with amputation of right 4th and 5th toe amputation).  Neuro: AO x 3. No focal deficits.   Laboratory:  Recent Labs Lab 04/04/13 1035 04/05/13  0800 04/06/13 0440  WBC 6.3 5.4 3.7*  HGB 9.1* 9.4* 9.0*  HCT 28.1* 29.7* 28.3*  PLT 144* 146* 150    Recent Labs Lab 04/04/13 0625 04/04/13 1035 04/05/13 0630 04/06/13 0440  NA 141  --  148* 142  K 3.4*  --  3.5* 3.5*  CL 102  --  105 102  CO2 23  --  30 25  BUN 16  --  14 10  CREATININE 0.85 0.88 0.99 0.78  CALCIUM 9.0  --  9.1 8.8  PROT  --   --   --  6.8  BILITOT  --   --   --  0.2*  ALKPHOS  --   --   --  91  ALT  --   --   --  27  AST  --   --   --  20   GLUCOSE 274*  --  142* 255*    Recent Labs Lab 04/05/13 0610 04/05/13 1109 04/05/13 1621 04/05/13 2140 04/06/13 0641  GLUCAP 140* 192* 204* 198* 262*      Recent Labs Lab 04/04/13 1035 04/04/13 1501 04/04/13 2200 04/05/13 0158 04/05/13 0630  TROPONINI <0.30 <0.30 0.38* 0.32* <0.30     Imaging/Diagnostic Tests:   Rosemarie Ax, MD 04/06/2013, 7:48 AM PGY-1, [place] Intern pager: [phone], text pages welcome

## 2013-04-06 NOTE — Progress Notes (Signed)
ANTICOAGULATION CONSULT NOTE - Follow Up Consult  Pharmacy Consult for Heparin  Indication: Mitral Valve dz  No Known Allergies  Patient Measurements: Height: 5\' 8"  (172.7 cm) Weight: 217 lb 4.8 oz (98.567 kg) (scale B) IBW/kg (Calculated) : 68.4 Heparin Dosing Weight: 89.4 kg  Vital Signs: Temp: 97.9 F (36.6 C) (03/04 1301) Temp src: Oral (03/04 1301) BP: 175/74 mmHg (03/04 1301) Pulse Rate: 72 (03/04 0650)  Labs:  Recent Labs  04/04/13 1035  04/04/13 2200 04/05/13 0158 04/05/13 0630 04/05/13 0800 04/05/13 1335 04/06/13 0440 04/06/13 1345  HGB 9.1*  --   --   --   --  9.4*  --  9.0*  --   HCT 28.1*  --   --   --   --  29.7*  --  28.3*  --   PLT 144*  --   --   --   --  146*  --  150  --   LABPROT  --   --   --   --   --   --  13.6  --   --   INR  --   --   --   --   --   --  1.06  --   --   HEPARINUNFRC  --   --   --   --   --  0.33  --  <0.10* 0.13*  CREATININE 0.88  --   --   --  0.99  --   --  0.78  --   TROPONINI <0.30  < > 0.38* 0.32* <0.30  --   --   --   --   < > = values in this interval not displayed.  Estimated Creatinine Clearance: 110.4 ml/min (by C-G formula based on Cr of 0.78).  Assessment: 63yo male admitted for pulmonary edema/volume overload. NSTEMI. PMH: HTN, PAD, Gangrene of toe, HLD, DM, tobacco, anemia  Anticoagulation: NSTEMI, Troponins mildly elevated. S/P cath with 3 vessel obstructive CAD with chronic total occlusion of RCA. CABG planned for 3/5. Has had some recurrent CP. Heparin level 0.13 still not quite to goal.   Goal of Therapy:  Heparin level 0.3-0.7 units/ml Monitor platelets by anticoagulation protocol: Yes   Plan:  Heparin 2000 unit IV bolus Increase IV heparin to 1850 units/hr Will recheck heparin level in 6-8 hrs. Plan CABG tomorrow.   Beatriz Settles S. Alford Highland, PharmD, BCPS Clinical Staff Pharmacist Pager [phone]  Eilene Ghazi Stillinger 04/06/2013,2:55 PM

## 2013-04-06 NOTE — Progress Notes (Addendum)
[name] Date of Encounter: 04/06/2013   Principal Problem:   NSTEMI (non-ST elevated myocardial infarction) Active Problems:   TOBACCO ABUSE   Acute pulmonary edema   CAD (coronary artery disease)   Acute respiratory failure   Diabetes mellitus, type 2   Essential hypertension, benign   Mixed hyperlipidemia   Normocytic anemia   PVD - hx of Rt SFA PTA and s/p Rt 4-5th toe amp Dec [id number]   Acute diastolic CHF (congestive heart failure)   SUBJECTIVE  Had some chest pain last night that did not respond to 3 sl NTG.  Pain eventually eased off after MSO4.  No chest pain since.  Went for preop testing most of this morning.  CURRENT MEDS . aspirin EC  81 mg Oral Daily  . budesonide-formoterol  2 puff Inhalation BID  . carvedilol  6.25 mg Oral BID WC  . cloNIDine  0.2 mg Oral BID  . furosemide  40 mg Oral BID  . insulin aspart  0-20 Units Subcutaneous TID WC  . insulin aspart  0-5 Units Subcutaneous QHS  . insulin aspart  3 Units Subcutaneous TID WC  . insulin glargine  35 Units Subcutaneous BID  . isosorbide dinitrate  10 mg Oral TID  . lisinopril  40 mg Oral Daily  . potassium chloride  40 mEq Oral Once  . simvastatin  20 mg Oral QHS  . sodium chloride  3 mL Intravenous Q12H   OBJECTIVE  Filed Vitals:   04/05/13 1900 04/05/13 1958 04/06/13 0220 04/06/13 0650  BP: 141/77 167/82 188/88 161/78  Pulse: 74 82 74 72  Temp:   97.8 F (36.6 C) 97.9 F (36.6 C)  TempSrc:   Oral Oral  Resp:   18 20  Height:      Weight:    217 lb 4.8 oz (98.567 kg)  SpO2: 99% 98% 98% 99%    Intake/Output Summary (Last 24 hours) at 04/06/13 0946 Last data filed at 04/06/13 0858  Gross per 24 hour  Intake 859.38 ml  Output    900 ml  Net -40.62 ml   Filed Weights   04/05/13 0615 04/05/13 1349 04/06/13 0650  Weight: 218 lb 14.4 oz (99.292 kg) 218 lb 14.4 oz (99.292 kg) 217 lb 4.8 oz (98.567 kg)    PHYSICAL EXAM  General: Pleasant, NAD. Neuro: Alert and oriented  X 3. Moves all extremities spontaneously. Psych: Normal affect. HEENT:  Normal  Neck: Supple without bruits or JVD. Lungs:  Resp regular and unlabored, CTA. Heart: RRR no s3, s4, or murmurs. Abdomen: Soft, non-tender, non-distended, BS + x 4.  Extremities: No clubbing, cyanosis.  2+ RLE edema, 1+ LLE edema. DP/PT/Radials 2+ and equal bilaterally.  R wrist cath site w/o bleeding/bruit/hematoma.  Accessory Clinical Findings  CBC  Recent Labs  04/04/13 0625  04/05/13 0800 04/06/13 0440  WBC 6.4  < > 5.4 3.7*  NEUTROABS 2.4  --   --   --   HGB 9.8*  < > 9.4* 9.0*  HCT 30.5*  < > 29.7* 28.3*  MCV 82.7  < > 82.3 82.3  PLT 148*  < > 146* 150  < > = values in this interval not displayed. Basic Metabolic Panel  Recent Labs  04/05/13 0630 04/06/13 0440  NA 148* 142  K 3.5* 3.5*  CL 105 102  CO2 30 25  GLUCOSE 142* 255*  BUN 14 10  CREATININE 0.99 0.78  CALCIUM 9.1 8.8   Liver Function  Tests  Recent Labs  04/06/13 0440  AST 20  ALT 27  ALKPHOS 91  BILITOT 0.2*  PROT 6.8  ALBUMIN 3.6   Cardiac Enzymes  Recent Labs  04/04/13 2200 04/05/13 0158 04/05/13 0630  TROPONINI 0.38* 0.32* <0.30   Hemoglobin A1C  Recent Labs  04/05/13 0630  HGBA1C 8.6*   TELE  rsr  Radiology/Studies  Dg Chest 2 View  04/06/2013   CLINICAL DATA:  Weakness, shortness of Breath  EXAM: CHEST  2 VIEW  COMPARISON:  04/04/2013  FINDINGS: Cardiomediastinal silhouette is stable. No segmental infiltrate or pleural effusion. No pulmonary edema. Mild left basilar atelectasis or infiltrate.  IMPRESSION: Mild left basilar atelectasis or infiltrate.  No pulmonary edema.   Electronically Signed   By: Lahoma Crocker M.D.   On: 04/06/2013 08:28   2D Echocardiogram 3.2.2015  Study Conclusions  - Left ventricle: The cavity size was normal. Wall thickness   was increased in a pattern of moderate LVH. Systolic   function was normal. The estimated ejection fraction was   in the range of 55% to 60%.  Wall motion was normal; there   were no regional wall motion abnormalities. Features are   consistent with a pseudonormal left ventricular filling   pattern, with concomitant abnormal relaxation and   increased filling pressure (grade 2 diastolic   dysfunction). - Mitral valve: Calcified annulus. - Left atrium: The atrium was mildly dilated. - Pericardium, extracardiac: A trivial pericardial effusion   was identified. _____________    ASSESSMENT AND PLAN  1.  NSTEMI/CAD:  S/p cath revealing severe multivessel CAD.  Tentatively scheduled for CABG tomorrow morning.  He did have c/p overnight that required morphine.  Cont asa, bb, statin, nitrate therapy.  2.  Acute diastolic chf/acute pulmonary edema:  In setting of #1.  Nl EF on echo with Gr 2 DD.  LV pressure was 144/12 on cath.  He does have R>L lower ext edema and is currently on PO lasxix.  HR stable.  BP could be better. Will push coreg to 12.5 bid for today.  Cont PO lasix but will give lasix 40mg  IV x 1 now.  He was weighing in the low 200's earlier this year and is now 217.  3.  HTN:  As above, will increase coreg for today.  4. HL:  On statin.  LDL of 40 in October 2014.  5.  Tob Abuse:  Quit.  6.  DM:  A1c 8.6.  Per IM.  7.  Hypokalemia:  Orders written for supplementation.  8.  Microcytic anemia:  Stable.  Wynn Maudlin 04/06/2013 1:23 PM  Personally seen and examined. Agree with above.  CABG planned tomorrow.  Watch for fluid overload post CABG since weight is up.  Post op would benefit from DAPT (clopidogrel for instance) given his NSTEMI.  Candee Furbish, MD

## 2013-04-06 NOTE — Progress Notes (Signed)
VASCULAR LAB PRELIMINARY  PRELIMINARY  PRELIMINARY  PRELIMINARY  Pre-op Cardiac Surgery  Carotid Findings:  Bilateral:  1-39% ICA stenosis.  Vertebral artery flow is antegrade.     Upper Extremity Right Left  Brachial Pressures Restircted limb 196 Triphasic  Radial Waveforms Triphasic Triphasic  Ulnar Waveforms Triphasic Triphasic  Palmar Arch (Allen's Test) Normal Normal   Findings:  Doppler waveforms remained normal bilaterally with both radial and ulnar compressions.    Lower  Extremity Right Left  Dorsalis Pedis 193 Monophasic 154 Monophasic  Posterior Tibial 154 Dampened Monophasic 159 Biphasic to Triphasic  Ankle/Brachial Indices 0.98 0.81    Findings:  ABI on the right indicates normal arterial flow however Doppler waveforms may suggest a false elevation of pressures which would adversely affect the results. Left ABIs indicate a mild reduction in arterial flow   Lauralyn Shadowens, RVS 04/06/2013, 12:31 PM

## 2013-04-06 NOTE — Progress Notes (Addendum)
UR completed   Yovanny Coats K. Keesha Pellum, RN, BSN, MSHL, CCM  04/06/2013 1:48 PM

## 2013-04-06 NOTE — Progress Notes (Signed)
CARDIAC REHAB PHASE I   PRE:  Rate/Rhythm: 85 SR    BP: sitting 130/80    SaO2:   MODE:  Ambulation: 400 ft   POST:  Rate/Rhythm: 85 SR    BP: sitting 160/80     SaO2:   Tolerated fairly well. Slightly wobbly at times. Denied CP/SOB. Discussed mobility and IS post op. Gave OHS booklet and set up video. All questions answered. In recliner, trying to watch video however he is dosing off. [id number]   Darrick Meigs CES, ACSM 04/06/2013 2:41 PM

## 2013-04-07 ENCOUNTER — Encounter (HOSPITAL_COMMUNITY)
Admission: EM | Disposition: A | Discharge status: Skilled Nursing Facility | Payer: Medicaid Other | Source: Home / Self Care | Attending: Cardiothoracic Surgery

## 2013-04-07 ENCOUNTER — Inpatient Hospital Stay (HOSPITAL_COMMUNITY): Payer: Medicaid Other | Admitting: Anesthesiology

## 2013-04-07 ENCOUNTER — Encounter (HOSPITAL_COMMUNITY): Payer: Medicaid Other | Admitting: Anesthesiology

## 2013-04-07 ENCOUNTER — Encounter (HOSPITAL_COMMUNITY): Payer: Self-pay | Admitting: Anesthesiology

## 2013-04-07 ENCOUNTER — Inpatient Hospital Stay (HOSPITAL_COMMUNITY): Payer: Medicaid Other

## 2013-04-07 DIAGNOSIS — I2510 Atherosclerotic heart disease of native coronary artery without angina pectoris: Secondary | ICD-10-CM

## 2013-04-07 DIAGNOSIS — Z951 Presence of aortocoronary bypass graft: Secondary | ICD-10-CM

## 2013-04-07 DIAGNOSIS — N179 Acute kidney failure, unspecified: Secondary | ICD-10-CM

## 2013-04-07 DIAGNOSIS — J9600 Acute respiratory failure, unspecified whether with hypoxia or hypercapnia: Secondary | ICD-10-CM

## 2013-04-07 HISTORY — PX: INTRAOPERATIVE TRANSESOPHAGEAL ECHOCARDIOGRAM: SHX5062

## 2013-04-07 HISTORY — PX: CORONARY ARTERY BYPASS GRAFT: SHX141

## 2013-04-07 HISTORY — DX: Presence of aortocoronary bypass graft: Z95.1

## 2013-04-07 LAB — POCT I-STAT 3, ART BLOOD GAS (G3+)
ACID-BASE DEFICIT: 1 mmol/L (ref 0.0–2.0)
ACID-BASE EXCESS: 1 mmol/L (ref 0.0–2.0)
Acid-Base Excess: 2 mmol/L (ref 0.0–2.0)
Acid-Base Excess: 1 mmol/L (ref 0.0–2.0)
Acid-base deficit: 1 mmol/L (ref 0.0–2.0)
Acid-base deficit: 2 mmol/L (ref 0.0–2.0)
Acid-base deficit: 2 mmol/L (ref 0.0–2.0)
Acid-base deficit: 2 mmol/L (ref 0.0–2.0)
BICARBONATE: 24.6 meq/L — AB (ref 20.0–24.0)
Bicarbonate: 28.1 mEq/L — ABNORMAL HIGH (ref 20.0–24.0)
Bicarbonate: 27.3 mEq/L — ABNORMAL HIGH (ref 20.0–24.0)
Bicarbonate: 25.3 mEq/L — ABNORMAL HIGH (ref 20.0–24.0)
Bicarbonate: 24.6 mEq/L — ABNORMAL HIGH (ref 20.0–24.0)
Bicarbonate: 26.7 mEq/L — ABNORMAL HIGH (ref 20.0–24.0)
Bicarbonate: 23.5 mEq/L (ref 20.0–24.0)
Bicarbonate: 23.6 mEq/L (ref 20.0–24.0)
O2 SAT: 99 %
O2 SAT: 87 %
O2 SAT: 93 %
O2 Saturation: 88 %
O2 Saturation: 100 %
O2 Saturation: 100 %
O2 Saturation: 94 %
O2 Saturation: 96 %
PCO2 ART: 55.5 mmHg — AB (ref 35.0–45.0)
PCO2 ART: 46.3 mmHg — AB (ref 35.0–45.0)
PCO2 ART: 40.8 mmHg (ref 35.0–45.0)
PH ART: 7.379 (ref 7.350–7.450)
PH ART: 7.311 — AB (ref 7.350–7.450)
PO2 ART: 62 mmHg — AB (ref 80.0–100.0)
PO2 ART: 281 mmHg — AB (ref 80.0–100.0)
PO2 ART: 54 mmHg — AB (ref 80.0–100.0)
PO2 ART: 78 mmHg — AB (ref 80.0–100.0)
PO2 ART: 88 mmHg (ref 80.0–100.0)
PO2 ART: 71 mmHg — AB (ref 80.0–100.0)
Patient temperature: 36.6
Patient temperature: 36.9
Patient temperature: 37.1
Patient temperature: 37
TCO2: 30 mmol/L (ref 0–100)
TCO2: 29 mmol/L (ref 0–100)
TCO2: 27 mmol/L (ref 0–100)
TCO2: 26 mmol/L (ref 0–100)
TCO2: 28 mmol/L (ref 0–100)
TCO2: 26 mmol/L (ref 0–100)
TCO2: 25 mmol/L (ref 0–100)
TCO2: 25 mmol/L (ref 0–100)
pCO2 arterial: 48.2 mmHg — ABNORMAL HIGH (ref 35.0–45.0)
pCO2 arterial: 43.5 mmHg (ref 35.0–45.0)
pCO2 arterial: 44.8 mmHg (ref 35.0–45.0)
pCO2 arterial: 48.7 mmHg — ABNORMAL HIGH (ref 35.0–45.0)
pCO2 arterial: 40.8 mmHg (ref 35.0–45.0)
pH, Arterial: 7.312 — ABNORMAL LOW (ref 7.350–7.450)
pH, Arterial: 7.379 (ref 7.350–7.450)
pH, Arterial: 7.329 — ABNORMAL LOW (ref 7.350–7.450)
pH, Arterial: 7.359 (ref 7.350–7.450)
pH, Arterial: 7.369 (ref 7.350–7.450)
pH, Arterial: 7.37 (ref 7.350–7.450)
pO2, Arterial: 164 mmHg — ABNORMAL HIGH (ref 80.0–100.0)
pO2, Arterial: 247 mmHg — ABNORMAL HIGH (ref 80.0–100.0)

## 2013-04-07 LAB — CBC
HCT: 32.5 % — ABNORMAL LOW (ref 39.0–52.0)
HEMATOCRIT: 28.5 % — AB (ref 39.0–52.0)
HEMATOCRIT: 31.6 % — AB (ref 39.0–52.0)
Hemoglobin: 9.2 g/dL — ABNORMAL LOW (ref 13.0–17.0)
Hemoglobin: 10.1 g/dL — ABNORMAL LOW (ref 13.0–17.0)
Hemoglobin: 10.8 g/dL — ABNORMAL LOW (ref 13.0–17.0)
MCH: 26.5 pg (ref 26.0–34.0)
MCH: 26.2 pg (ref 26.0–34.0)
MCH: 27.2 pg (ref 26.0–34.0)
MCHC: 32.3 g/dL (ref 30.0–36.0)
MCHC: 32 g/dL (ref 30.0–36.0)
MCHC: 33.2 g/dL (ref 30.0–36.0)
MCV: 82.1 fL (ref 78.0–100.0)
MCV: 81.9 fL (ref 78.0–100.0)
MCV: 81.9 fL (ref 78.0–100.0)
Platelets: 133 10*3/uL — ABNORMAL LOW (ref 150–400)
Platelets: 104 10*3/uL — ABNORMAL LOW (ref 150–400)
Platelets: 149 10*3/uL — ABNORMAL LOW (ref 150–400)
RBC: 3.47 MIL/uL — ABNORMAL LOW (ref 4.22–5.81)
RBC: 3.86 MIL/uL — ABNORMAL LOW (ref 4.22–5.81)
RBC: 3.97 MIL/uL — ABNORMAL LOW (ref 4.22–5.81)
RDW: 17.3 % — AB (ref 11.5–15.5)
RDW: 16.4 % — AB (ref 11.5–15.5)
RDW: 17 % — ABNORMAL HIGH (ref 11.5–15.5)
WBC: 4 10*3/uL (ref 4.0–10.5)
WBC: 5.9 10*3/uL (ref 4.0–10.5)
WBC: 8 10*3/uL (ref 4.0–10.5)

## 2013-04-07 LAB — POCT I-STAT 4, (NA,K, GLUC, HGB,HCT)
GLUCOSE: 151 mg/dL — AB (ref 70–99)
GLUCOSE: 118 mg/dL — AB (ref 70–99)
GLUCOSE: 124 mg/dL — AB (ref 70–99)
Glucose, Bld: 185 mg/dL — ABNORMAL HIGH (ref 70–99)
Glucose, Bld: 157 mg/dL — ABNORMAL HIGH (ref 70–99)
Glucose, Bld: 126 mg/dL — ABNORMAL HIGH (ref 70–99)
HCT: 26 % — ABNORMAL LOW (ref 39.0–52.0)
HCT: 23 % — ABNORMAL LOW (ref 39.0–52.0)
HCT: 24 % — ABNORMAL LOW (ref 39.0–52.0)
HCT: 33 % — ABNORMAL LOW (ref 39.0–52.0)
HEMATOCRIT: 24 % — AB (ref 39.0–52.0)
HEMATOCRIT: 25 % — AB (ref 39.0–52.0)
HEMOGLOBIN: 8.8 g/dL — AB (ref 13.0–17.0)
HEMOGLOBIN: 8.2 g/dL — AB (ref 13.0–17.0)
HEMOGLOBIN: 8.2 g/dL — AB (ref 13.0–17.0)
HEMOGLOBIN: 8.5 g/dL — AB (ref 13.0–17.0)
HEMOGLOBIN: 11.2 g/dL — AB (ref 13.0–17.0)
Hemoglobin: 7.8 g/dL — ABNORMAL LOW (ref 13.0–17.0)
POTASSIUM: 3.5 meq/L — AB (ref 3.7–5.3)
POTASSIUM: 4 meq/L (ref 3.7–5.3)
POTASSIUM: 3.8 meq/L (ref 3.7–5.3)
Potassium: 4.6 mEq/L (ref 3.7–5.3)
Potassium: 4.2 mEq/L (ref 3.7–5.3)
Potassium: 3.8 mEq/L (ref 3.7–5.3)
SODIUM: 143 meq/L (ref 137–147)
SODIUM: 140 meq/L (ref 137–147)
Sodium: 141 mEq/L (ref 137–147)
Sodium: 143 mEq/L (ref 137–147)
Sodium: 140 mEq/L (ref 137–147)
Sodium: 141 mEq/L (ref 137–147)

## 2013-04-07 LAB — PLATELET COUNT: Platelets: 89 10*3/uL — ABNORMAL LOW (ref 150–400)

## 2013-04-07 LAB — BASIC METABOLIC PANEL
BUN: 13 mg/dL (ref 6–23)
CHLORIDE: 103 meq/L (ref 96–112)
CO2: 22 mEq/L (ref 19–32)
CREATININE: 0.91 mg/dL (ref 0.50–1.35)
Calcium: 9.1 mg/dL (ref 8.4–10.5)
GFR calc non Af Amer: 90 mL/min — ABNORMAL LOW (ref >90)
Glucose, Bld: 209 mg/dL — ABNORMAL HIGH (ref 70–99)
POTASSIUM: 3.9 meq/L (ref 3.7–5.3)
Sodium: 144 mEq/L (ref 137–147)

## 2013-04-07 LAB — PREPARE RBC (CROSSMATCH)

## 2013-04-07 LAB — GLUCOSE, CAPILLARY
GLUCOSE-CAPILLARY: 109 mg/dL — AB (ref 70–99)
GLUCOSE-CAPILLARY: 93 mg/dL (ref 70–99)
GLUCOSE-CAPILLARY: 102 mg/dL — AB (ref 70–99)
Glucose-Capillary: 182 mg/dL — ABNORMAL HIGH (ref 70–99)
Glucose-Capillary: 94 mg/dL (ref 70–99)
Glucose-Capillary: 113 mg/dL — ABNORMAL HIGH (ref 70–99)
Glucose-Capillary: 146 mg/dL — ABNORMAL HIGH (ref 70–99)
Glucose-Capillary: 141 mg/dL — ABNORMAL HIGH (ref 70–99)
Glucose-Capillary: 131 mg/dL — ABNORMAL HIGH (ref 70–99)
Glucose-Capillary: 127 mg/dL — ABNORMAL HIGH (ref 70–99)
Glucose-Capillary: 124 mg/dL — ABNORMAL HIGH (ref 70–99)

## 2013-04-07 LAB — POCT I-STAT 3, VENOUS BLOOD GAS (G3P V)
ACID-BASE EXCESS: 1 mmol/L (ref 0.0–2.0)
BICARBONATE: 28.1 meq/L — AB (ref 20.0–24.0)
O2 SAT: 76 %
PCO2 VEN: 58.6 mmHg — AB (ref 45.0–50.0)
PO2 VEN: 47 mmHg — AB (ref 30.0–45.0)
TCO2: 30 mmol/L (ref 0–100)
pH, Ven: 7.289 (ref 7.250–7.300)

## 2013-04-07 LAB — HEMOGLOBIN AND HEMATOCRIT, BLOOD
HCT: 23.9 % — ABNORMAL LOW (ref 39.0–52.0)
Hemoglobin: 8 g/dL — ABNORMAL LOW (ref 13.0–17.0)

## 2013-04-07 LAB — POCT I-STAT, CHEM 8
BUN: 11 mg/dL (ref 6–23)
CHLORIDE: 106 meq/L (ref 96–112)
CREATININE: 1.2 mg/dL (ref 0.50–1.35)
Calcium, Ion: 1.14 mmol/L (ref 1.13–1.30)
Glucose, Bld: 139 mg/dL — ABNORMAL HIGH (ref 70–99)
HCT: 33 % — ABNORMAL LOW (ref 39.0–52.0)
HEMOGLOBIN: 11.2 g/dL — AB (ref 13.0–17.0)
POTASSIUM: 4.3 meq/L (ref 3.7–5.3)
SODIUM: 144 meq/L (ref 137–147)
TCO2: 23 mmol/L (ref 0–100)

## 2013-04-07 LAB — HEPARIN LEVEL (UNFRACTIONATED): Heparin Unfractionated: 0.35 IU/mL (ref 0.30–0.70)

## 2013-04-07 LAB — CREATININE, SERUM
Creatinine, Ser: 1.09 mg/dL (ref 0.50–1.35)
GFR calc Af Amer: 83 mL/min — ABNORMAL LOW (ref >90)
GFR calc non Af Amer: 71 mL/min — ABNORMAL LOW (ref >90)

## 2013-04-07 LAB — PROTIME-INR
INR: 1.24 (ref 0.00–1.49)
PROTHROMBIN TIME: 15.3 s — AB (ref 11.6–15.2)

## 2013-04-07 LAB — APTT
aPTT: 67 seconds — ABNORMAL HIGH (ref 24–37)
aPTT: 41 seconds — ABNORMAL HIGH (ref 24–37)

## 2013-04-07 LAB — MAGNESIUM: Magnesium: 2.7 mg/dL — ABNORMAL HIGH (ref 1.5–2.5)

## 2013-04-07 SURGERY — CORONARY ARTERY BYPASS GRAFTING (CABG)
Anesthesia: General | Site: Chest

## 2013-04-07 MED ORDER — PROTAMINE SULFATE 10 MG/ML IV SOLN
INTRAVENOUS | Status: DC | PRN
  Administered 2013-04-07: 310 mg via INTRAVENOUS

## 2013-04-07 MED ORDER — DEXTROSE 5 % IV SOLN
1.5000 g | Freq: Two times a day (BID) | INTRAVENOUS | Status: AC
  Administered 2013-04-07 – 2013-04-09 (×4): 1.5 g via INTRAVENOUS
  Filled 2013-04-07 (×5): qty 1.5

## 2013-04-07 MED ORDER — PROPOFOL 10 MG/ML IV BOLUS
INTRAVENOUS | Status: AC
  Filled 2013-04-07: qty 20

## 2013-04-07 MED ORDER — ROCURONIUM BROMIDE 50 MG/5ML IV SOLN
INTRAVENOUS | Status: AC
  Filled 2013-04-07: qty 1

## 2013-04-07 MED ORDER — ACETAMINOPHEN 500 MG PO TABS
1000.0000 mg | ORAL_TABLET | Freq: Four times a day (QID) | ORAL | Status: AC
  Administered 2013-04-07 – 2013-04-12 (×17): 1000 mg via ORAL
  Filled 2013-04-07 (×20): qty 2

## 2013-04-07 MED ORDER — FENTANYL CITRATE 0.05 MG/ML IJ SOLN
INTRAMUSCULAR | Status: AC
  Filled 2013-04-07: qty 5

## 2013-04-07 MED ORDER — MAGNESIUM SULFATE 4000MG/100ML IJ SOLN
4.0000 g | Freq: Once | INTRAMUSCULAR | Status: AC
  Administered 2013-04-07: 4 g via INTRAVENOUS
  Filled 2013-04-07: qty 100

## 2013-04-07 MED ORDER — ASPIRIN 81 MG PO CHEW
324.0000 mg | CHEWABLE_TABLET | Freq: Every day | ORAL | Status: DC
  Filled 2013-04-07: qty 4

## 2013-04-07 MED ORDER — PHENYLEPHRINE HCL 10 MG/ML IJ SOLN
INTRAMUSCULAR | Status: DC | PRN
  Administered 2013-04-07: 80 ug via INTRAVENOUS
  Administered 2013-04-07: 40 ug via INTRAVENOUS

## 2013-04-07 MED ORDER — SODIUM CHLORIDE 0.9 % IV SOLN
250.0000 mL | INTRAVENOUS | Status: DC
  Administered 2013-04-09: 10:00:00 via INTRAVENOUS

## 2013-04-07 MED ORDER — ARTIFICIAL TEARS OP OINT
TOPICAL_OINTMENT | OPHTHALMIC | Status: DC | PRN
  Administered 2013-04-07: 1 via OPHTHALMIC

## 2013-04-07 MED ORDER — BISACODYL 5 MG PO TBEC
10.0000 mg | DELAYED_RELEASE_TABLET | Freq: Every day | ORAL | Status: DC
  Administered 2013-04-08 – 2013-04-19 (×7): 10 mg via ORAL
  Filled 2013-04-07 (×6): qty 2

## 2013-04-07 MED ORDER — VECURONIUM BROMIDE 10 MG IV SOLR
INTRAVENOUS | Status: AC
  Filled 2013-04-07: qty 10

## 2013-04-07 MED ORDER — LEVALBUTEROL HCL 1.25 MG/0.5ML IN NEBU
1.2500 mg | INHALATION_SOLUTION | Freq: Four times a day (QID) | RESPIRATORY_TRACT | Status: DC
  Filled 2013-04-07 (×4): qty 0.5

## 2013-04-07 MED ORDER — SODIUM CHLORIDE 0.9 % IJ SOLN: 3.0000 mL | INTRAMUSCULAR | Status: DC | PRN

## 2013-04-07 MED ORDER — PANTOPRAZOLE SODIUM 40 MG PO TBEC
40.0000 mg | DELAYED_RELEASE_TABLET | Freq: Every day | ORAL | Status: DC
  Administered 2013-04-09 – 2013-04-20 (×12): 40 mg via ORAL
  Filled 2013-04-07 (×9): qty 1

## 2013-04-07 MED ORDER — SIMVASTATIN 20 MG PO TABS
20.0000 mg | ORAL_TABLET | Freq: Every day | ORAL | Status: DC
  Administered 2013-04-08 – 2013-04-19 (×12): 20 mg via ORAL
  Filled 2013-04-07 (×13): qty 1

## 2013-04-07 MED ORDER — ALBUMIN HUMAN 5 % IV SOLN
250.0000 mL | INTRAVENOUS | Status: AC | PRN
  Administered 2013-04-07: 250 mL via INTRAVENOUS

## 2013-04-07 MED ORDER — METOPROLOL TARTRATE 12.5 MG HALF TABLET
12.5000 mg | ORAL_TABLET | Freq: Two times a day (BID) | ORAL | Status: DC
  Filled 2013-04-07 (×3): qty 1

## 2013-04-07 MED ORDER — OXYCODONE HCL 5 MG PO TABS
5.0000 mg | ORAL_TABLET | ORAL | Status: DC | PRN
  Administered 2013-04-07 – 2013-04-08 (×2): 10 mg via ORAL
  Administered 2013-04-08: 5 mg via ORAL
  Administered 2013-04-08 – 2013-04-09 (×3): 10 mg via ORAL
  Administered 2013-04-10 – 2013-04-15 (×3): 5 mg via ORAL
  Administered 2013-04-16 – 2013-04-20 (×4): 10 mg via ORAL
  Filled 2013-04-07: qty 2
  Filled 2013-04-07: qty 1
  Filled 2013-04-07: qty 2
  Filled 2013-04-07: qty 1
  Filled 2013-04-07 (×2): qty 2
  Filled 2013-04-07 (×2): qty 1
  Filled 2013-04-07 (×5): qty 2

## 2013-04-07 MED ORDER — SODIUM CHLORIDE 0.9 % IJ SOLN
3.0000 mL | Freq: Two times a day (BID) | INTRAMUSCULAR | Status: DC
  Administered 2013-04-08 – 2013-04-11 (×7): 3 mL via INTRAVENOUS

## 2013-04-07 MED ORDER — ASPIRIN EC 325 MG PO TBEC
325.0000 mg | DELAYED_RELEASE_TABLET | Freq: Every day | ORAL | Status: DC
  Administered 2013-04-08 – 2013-04-20 (×13): 325 mg via ORAL
  Filled 2013-04-07 (×13): qty 1

## 2013-04-07 MED ORDER — MORPHINE SULFATE 2 MG/ML IJ SOLN
1.0000 mg | INTRAMUSCULAR | Status: AC | PRN
  Administered 2013-04-07 (×4): 2 mg via INTRAVENOUS
  Filled 2013-04-07 (×4): qty 1

## 2013-04-07 MED ORDER — ACETAMINOPHEN 650 MG RE SUPP
650.0000 mg | Freq: Once | RECTAL | Status: AC
  Administered 2013-04-07: 650 mg via RECTAL

## 2013-04-07 MED ORDER — VANCOMYCIN HCL IN DEXTROSE 1-5 GM/200ML-% IV SOLN
1000.0000 mg | Freq: Once | INTRAVENOUS | Status: AC
Start: 2013-04-07 — End: 2013-04-07
  Administered 2013-04-07: 1000 mg via INTRAVENOUS
  Filled 2013-04-07: qty 200

## 2013-04-07 MED ORDER — NITROGLYCERIN 0.2 MG/ML ON CALL CATH LAB
INTRAVENOUS | Status: DC | PRN
  Administered 2013-04-07 (×2): 40 ug via INTRAVENOUS

## 2013-04-07 MED ORDER — METOPROLOL TARTRATE 25 MG/10 ML ORAL SUSPENSION
12.5000 mg | Freq: Two times a day (BID) | ORAL | Status: DC
  Filled 2013-04-07 (×3): qty 5

## 2013-04-07 MED ORDER — HEPARIN SODIUM (PORCINE) 1000 UNIT/ML IJ SOLN
INTRAMUSCULAR | Status: AC
  Filled 2013-04-07: qty 1

## 2013-04-07 MED ORDER — LACTATED RINGERS IV SOLN
INTRAVENOUS | Status: DC | PRN
  Administered 2013-04-07: 07:00:00 via INTRAVENOUS

## 2013-04-07 MED ORDER — VECURONIUM BROMIDE 10 MG IV SOLR
INTRAVENOUS | Status: DC | PRN
  Administered 2013-04-07 (×2): 5 mg via INTRAVENOUS

## 2013-04-07 MED ORDER — ROCURONIUM BROMIDE 100 MG/10ML IV SOLN
INTRAVENOUS | Status: DC | PRN
  Administered 2013-04-07: 50 mg via INTRAVENOUS

## 2013-04-07 MED ORDER — NITROGLYCERIN IN D5W 200-5 MCG/ML-% IV SOLN
0.0000 ug/min | INTRAVENOUS | Status: DC
  Administered 2013-04-07: 5 ug/min via INTRAVENOUS

## 2013-04-07 MED ORDER — LEVALBUTEROL HCL 1.25 MG/0.5ML IN NEBU
1.2500 mg | INHALATION_SOLUTION | Freq: Four times a day (QID) | RESPIRATORY_TRACT | Status: DC
Start: 2013-04-07 — End: 2013-04-07
  Filled 2013-04-07 (×4): qty 0.5

## 2013-04-07 MED ORDER — LACTATED RINGERS IV SOLN
INTRAVENOUS | Status: DC
  Administered 2013-04-07: 20 mL/h via INTRAVENOUS

## 2013-04-07 MED ORDER — BISACODYL 10 MG RE SUPP: 10.0000 mg | Freq: Every day | RECTAL | Status: DC

## 2013-04-07 MED ORDER — DOCUSATE SODIUM 100 MG PO CAPS
200.0000 mg | ORAL_CAPSULE | Freq: Every day | ORAL | Status: DC
  Administered 2013-04-08 – 2013-04-19 (×8): 200 mg via ORAL
  Filled 2013-04-07 (×14): qty 2

## 2013-04-07 MED ORDER — LACTATED RINGERS IV SOLN
INTRAVENOUS | Status: DC | PRN
  Administered 2013-04-07 (×2): via INTRAVENOUS

## 2013-04-07 MED ORDER — ACETAMINOPHEN 160 MG/5ML PO SOLN: 1000.0000 mg | Freq: Four times a day (QID) | ORAL | Status: DC

## 2013-04-07 MED ORDER — INSULIN REGULAR BOLUS VIA INFUSION
0.0000 [IU] | Freq: Three times a day (TID) | INTRAVENOUS | Status: DC
  Filled 2013-04-07: qty 10

## 2013-04-07 MED ORDER — MIDAZOLAM HCL 2 MG/2ML IJ SOLN: 2.0000 mg | INTRAMUSCULAR | Status: DC | PRN

## 2013-04-07 MED ORDER — MORPHINE SULFATE 2 MG/ML IJ SOLN
2.0000 mg | INTRAMUSCULAR | Status: DC | PRN
  Administered 2013-04-08: 2 mg via INTRAVENOUS
  Administered 2013-04-08: 4 mg via INTRAVENOUS
  Administered 2013-04-08 (×2): 2 mg via INTRAVENOUS
  Filled 2013-04-07 (×3): qty 1
  Filled 2013-04-07: qty 2

## 2013-04-07 MED ORDER — SODIUM CHLORIDE 0.9 % IJ SOLN
OROMUCOSAL | Status: DC | PRN
  Administered 2013-04-07 (×3): via TOPICAL

## 2013-04-07 MED ORDER — 0.9 % SODIUM CHLORIDE (POUR BTL) OPTIME
TOPICAL | Status: DC | PRN
  Administered 2013-04-07: 1000 mL

## 2013-04-07 MED ORDER — MIDAZOLAM HCL 10 MG/2ML IJ SOLN
INTRAMUSCULAR | Status: AC
  Filled 2013-04-07: qty 2

## 2013-04-07 MED ORDER — FAMOTIDINE IN NACL 20-0.9 MG/50ML-% IV SOLN
20.0000 mg | Freq: Two times a day (BID) | INTRAVENOUS | Status: AC
  Administered 2013-04-07: 20 mg via INTRAVENOUS

## 2013-04-07 MED ORDER — FENTANYL CITRATE 0.05 MG/ML IJ SOLN
INTRAMUSCULAR | Status: DC | PRN
  Administered 2013-04-07: 200 ug via INTRAVENOUS
  Administered 2013-04-07: 50 ug via INTRAVENOUS
  Administered 2013-04-07: 250 ug via INTRAVENOUS
  Administered 2013-04-07: 50 ug via INTRAVENOUS
  Administered 2013-04-07: 150 ug via INTRAVENOUS
  Administered 2013-04-07: 200 ug via INTRAVENOUS
  Administered 2013-04-07: 250 ug via INTRAVENOUS
  Administered 2013-04-07: 150 ug via INTRAVENOUS
  Administered 2013-04-07: 200 ug via INTRAVENOUS
  Administered 2013-04-07: 150 ug via INTRAVENOUS
  Administered 2013-04-07 (×2): 100 ug via INTRAVENOUS
  Administered 2013-04-07: 50 ug via INTRAVENOUS

## 2013-04-07 MED ORDER — HEPARIN SODIUM (PORCINE) 1000 UNIT/ML IJ SOLN
INTRAMUSCULAR | Status: DC | PRN
  Administered 2013-04-07: 2000 [IU] via INTRAVENOUS
  Administered 2013-04-07: 5000 [IU] via INTRAVENOUS
  Administered 2013-04-07: 23000 [IU] via INTRAVENOUS

## 2013-04-07 MED ORDER — AMINOCAPROIC ACID 250 MG/ML IV SOLN
INTRAVENOUS | Status: DC | PRN
  Administered 2013-04-07: 5 g via INTRAVENOUS

## 2013-04-07 MED ORDER — METOPROLOL TARTRATE 1 MG/ML IV SOLN
2.5000 mg | INTRAVENOUS | Status: DC | PRN
Start: 2013-04-07 — End: 2013-04-20

## 2013-04-07 MED ORDER — PROTAMINE SULFATE 10 MG/ML IV SOLN
INTRAVENOUS | Status: AC
  Filled 2013-04-07: qty 25

## 2013-04-07 MED ORDER — DEXMEDETOMIDINE HCL IN NACL 200 MCG/50ML IV SOLN
0.1000 ug/kg/h | INTRAVENOUS | Status: DC
  Administered 2013-04-07: 0.5 ug/kg/h via INTRAVENOUS
  Filled 2013-04-07: qty 50

## 2013-04-07 MED ORDER — DEXTROSE 5 % IV SOLN
0.0000 ug/min | INTRAVENOUS | Status: DC
  Filled 2013-04-07: qty 2

## 2013-04-07 MED ORDER — LEVALBUTEROL HCL 1.25 MG/0.5ML IN NEBU
1.2500 mg | INHALATION_SOLUTION | RESPIRATORY_TRACT | Status: DC
  Administered 2013-04-07 – 2013-04-11 (×22): 1.25 mg via RESPIRATORY_TRACT
  Filled 2013-04-07 (×29): qty 0.5

## 2013-04-07 MED ORDER — HEMOSTATIC AGENTS (NO CHARGE) OPTIME
TOPICAL | Status: DC | PRN
  Administered 2013-04-07: 1 via TOPICAL

## 2013-04-07 MED ORDER — SODIUM CHLORIDE 0.45 % IV SOLN
INTRAVENOUS | Status: DC
  Administered 2013-04-07: 20 mL/h via INTRAVENOUS

## 2013-04-07 MED ORDER — MIDAZOLAM HCL 5 MG/5ML IJ SOLN
INTRAMUSCULAR | Status: DC | PRN
  Administered 2013-04-07 (×2): 2 mg via INTRAVENOUS
  Administered 2013-04-07 (×2): 3 mg via INTRAVENOUS

## 2013-04-07 MED ORDER — LEVALBUTEROL HCL 1.25 MG/0.5ML IN NEBU: 1.2500 mg | INHALATION_SOLUTION | Freq: Four times a day (QID) | RESPIRATORY_TRACT | Status: DC

## 2013-04-07 MED ORDER — LEVALBUTEROL HCL 0.63 MG/3ML IN NEBU: 0.6300 mg | INHALATION_SOLUTION | Freq: Three times a day (TID) | RESPIRATORY_TRACT | Status: DC

## 2013-04-07 MED ORDER — PROPOFOL 10 MG/ML IV BOLUS
INTRAVENOUS | Status: DC | PRN
  Administered 2013-04-07 (×4): 50 mg via INTRAVENOUS

## 2013-04-07 MED ORDER — POTASSIUM CHLORIDE 10 MEQ/50ML IV SOLN
10.0000 meq | INTRAVENOUS | Status: AC
  Administered 2013-04-07 (×2): 10 meq via INTRAVENOUS

## 2013-04-07 MED ORDER — ACETAMINOPHEN 160 MG/5ML PO SOLN: 650.0000 mg | Freq: Once | ORAL | Status: AC

## 2013-04-07 MED ORDER — LEVALBUTEROL HCL 1.25 MG/0.5ML IN NEBU
1.2500 mg | INHALATION_SOLUTION | Freq: Four times a day (QID) | RESPIRATORY_TRACT | Status: DC
  Administered 2013-04-07: 1.25 mg via RESPIRATORY_TRACT
  Filled 2013-04-07 (×2): qty 0.5

## 2013-04-07 MED ORDER — LACTATED RINGERS IV SOLN: 500.0000 mL | Freq: Once | INTRAVENOUS | Status: AC | PRN

## 2013-04-07 MED ORDER — SODIUM CHLORIDE 0.9 % IV SOLN
INTRAVENOUS | Status: DC
  Filled 2013-04-07 (×2): qty 1

## 2013-04-07 MED ORDER — SODIUM CHLORIDE 0.9 % IV SOLN
INTRAVENOUS | Status: DC
  Administered 2013-04-08: 20 mL/h via INTRAVENOUS

## 2013-04-07 MED ORDER — ONDANSETRON HCL 4 MG/2ML IJ SOLN
4.0000 mg | Freq: Four times a day (QID) | INTRAMUSCULAR | Status: DC | PRN
Start: 2013-04-07 — End: 2013-04-20

## 2013-04-07 MED FILL — Heparin Sodium (Porcine) Inj 1000 Unit/ML: INTRAMUSCULAR | Qty: 30 | Status: AC

## 2013-04-07 MED FILL — Mannitol IV Soln 20%: INTRAVENOUS | Qty: 500 | Status: AC

## 2013-04-07 MED FILL — Sodium Chloride IV Soln 0.9%: INTRAVENOUS | Qty: 2000 | Status: AC

## 2013-04-07 MED FILL — Lidocaine HCl IV Inj 20 MG/ML: INTRAVENOUS | Qty: 5 | Status: AC

## 2013-04-07 MED FILL — Sodium Bicarbonate IV Soln 8.4%: INTRAVENOUS | Qty: 50 | Status: AC

## 2013-04-07 MED FILL — Electrolyte-R (PH 7.4) Solution: INTRAVENOUS | Qty: 4000 | Status: AC

## 2013-04-07 SURGICAL SUPPLY — 91 items
ADAPTER CARDIO PERF ANTE/RETRO (ADAPTER) ×4 IMPLANT
ADPR PRFSN 84XANTGRD RTRGD (ADAPTER) ×2
ATTRACTOMAT 16X20 MAGNETIC DRP (DRAPES) ×4 IMPLANT
BAG DECANTER FOR FLEXI CONT (MISCELLANEOUS) ×4 IMPLANT
BANDAGE ELASTIC 4 VELCRO ST LF (GAUZE/BANDAGES/DRESSINGS) ×4 IMPLANT
BANDAGE ELASTIC 6 VELCRO ST LF (GAUZE/BANDAGES/DRESSINGS) ×4 IMPLANT
BANDAGE GAUZE ELAST BULKY 4 IN (GAUZE/BANDAGES/DRESSINGS) ×4 IMPLANT
BASKET HEART  (ORDER IN 25'S) (MISCELLANEOUS) ×1
BASKET HEART (ORDER IN 25'S) (MISCELLANEOUS) ×1
BASKET HEART (ORDER IN 25S) (MISCELLANEOUS) ×2 IMPLANT
BLADE STERNUM SYSTEM 6 (BLADE) ×4 IMPLANT
BLADE SURG 12 STRL SS (BLADE) ×4 IMPLANT
BLADE SURG ROTATE 9660 (MISCELLANEOUS) IMPLANT
CANISTER SUCTION 2500CC (MISCELLANEOUS) ×4 IMPLANT
CANNULA GUNDRY RCSP 15FR (MISCELLANEOUS) ×4 IMPLANT
CANNULA VENOUS LOW PROF 32X40 (CANNULA) IMPLANT
CARDIAC SUCTION (MISCELLANEOUS) ×4 IMPLANT
CATH CPB KIT VANTRIGT (MISCELLANEOUS) ×4 IMPLANT
CATH ROBINSON RED A/P 18FR (CATHETERS) ×12 IMPLANT
CATH THORACIC 36FR RT ANG (CATHETERS) ×4 IMPLANT
CLIP TI WIDE RED SMALL 24 (CLIP) IMPLANT
COVER SURGICAL LIGHT HANDLE (MISCELLANEOUS) ×4 IMPLANT
CRADLE DONUT ADULT HEAD (MISCELLANEOUS) ×4 IMPLANT
DRAIN CHANNEL 32F RND 10.7 FF (WOUND CARE) ×4 IMPLANT
DRAPE CARDIOVASCULAR INCISE (DRAPES) ×4
DRAPE SLUSH/WARMER DISC (DRAPES) ×4 IMPLANT
DRAPE SRG 135X102X78XABS (DRAPES) ×2 IMPLANT
DRSG AQUACEL AG ADV 3.5X14 (GAUZE/BANDAGES/DRESSINGS) ×4 IMPLANT
ELECT BLADE 4.0 EZ CLEAN MEGAD (MISCELLANEOUS) ×4
ELECT BLADE 6.5 EXT (BLADE) ×4 IMPLANT
ELECT CAUTERY BLADE 6.4 (BLADE) ×4 IMPLANT
ELECT REM PT RETURN 9FT ADLT (ELECTROSURGICAL) ×8
ELECTRODE BLDE 4.0 EZ CLN MEGD (MISCELLANEOUS) ×2 IMPLANT
ELECTRODE REM PT RTRN 9FT ADLT (ELECTROSURGICAL) ×4 IMPLANT
GLOVE BIO SURGEON STRL SZ7.5 (GLOVE) ×12 IMPLANT
GOWN STRL REUS W/ TWL LRG LVL3 (GOWN DISPOSABLE) ×8 IMPLANT
GOWN STRL REUS W/TWL LRG LVL3 (GOWN DISPOSABLE) ×16
HEMOSTAT POWDER SURGIFOAM 1G (HEMOSTASIS) ×12 IMPLANT
HEMOSTAT SURGICEL 2X14 (HEMOSTASIS) ×4 IMPLANT
INSERT FOGARTY 61MM (MISCELLANEOUS) ×2 IMPLANT
INSERT FOGARTY XLG (MISCELLANEOUS) IMPLANT
KIT BASIN OR (CUSTOM PROCEDURE TRAY) ×4 IMPLANT
KIT ROOM TURNOVER OR (KITS) ×4 IMPLANT
KIT SUCTION CATH 14FR (SUCTIONS) ×4 IMPLANT
KIT VASOVIEW W/TROCAR VH 2000 (KITS) ×4 IMPLANT
LEAD PACING MYOCARDI (MISCELLANEOUS) ×4 IMPLANT
MARKER GRAFT CORONARY BYPASS (MISCELLANEOUS) ×12 IMPLANT
NS IRRIG 1000ML POUR BTL (IV SOLUTION) ×20 IMPLANT
PACK OPEN HEART (CUSTOM PROCEDURE TRAY) ×4 IMPLANT
PAD ARMBOARD 7.5X6 YLW CONV (MISCELLANEOUS) ×8 IMPLANT
PAD ELECT DEFIB RADIOL ZOLL (MISCELLANEOUS) ×4 IMPLANT
PENCIL BUTTON HOLSTER BLD 10FT (ELECTRODE) ×4 IMPLANT
PUNCH AORTIC ROTATE 4.0MM (MISCELLANEOUS) IMPLANT
PUNCH AORTIC ROTATE 4.5MM 8IN (MISCELLANEOUS) IMPLANT
PUNCH AORTIC ROTATE 5MM 8IN (MISCELLANEOUS) IMPLANT
SET CARDIOPLEGIA MPS 5001102 (MISCELLANEOUS) ×2 IMPLANT
SOLUTION ANTI FOG 6CC (MISCELLANEOUS) ×2 IMPLANT
SPONGE GAUZE 4X4 12PLY (GAUZE/BANDAGES/DRESSINGS) ×6 IMPLANT
SURGIFLO W/THROMBIN 8M KIT (HEMOSTASIS) ×4 IMPLANT
SUT BONE WAX W31G (SUTURE) ×4 IMPLANT
SUT MNCRL AB 4-0 PS2 18 (SUTURE) IMPLANT
SUT PROLENE 3 0 SH DA (SUTURE) IMPLANT
SUT PROLENE 3 0 SH1 36 (SUTURE) IMPLANT
SUT PROLENE 4 0 RB 1 (SUTURE) ×4
SUT PROLENE 4 0 SH DA (SUTURE) ×4 IMPLANT
SUT PROLENE 4-0 RB1 .5 CRCL 36 (SUTURE) ×2 IMPLANT
SUT PROLENE 5 0 C 1 36 (SUTURE) IMPLANT
SUT PROLENE 6 0 C 1 30 (SUTURE) IMPLANT
SUT PROLENE 6 0 CC (SUTURE) ×12 IMPLANT
SUT PROLENE 8 0 BV175 6 (SUTURE) IMPLANT
SUT PROLENE BLUE 7 0 (SUTURE) ×4 IMPLANT
SUT SILK  1 MH (SUTURE)
SUT SILK 1 MH (SUTURE) IMPLANT
SUT SILK 2 0 SH CR/8 (SUTURE) IMPLANT
SUT SILK 3 0 SH CR/8 (SUTURE) IMPLANT
SUT STEEL 6MS V (SUTURE) ×8 IMPLANT
SUT STEEL SZ 6 DBL 3X14 BALL (SUTURE) ×4 IMPLANT
SUT VIC AB 1 CTX 36 (SUTURE) ×8
SUT VIC AB 1 CTX36XBRD ANBCTR (SUTURE) ×4 IMPLANT
SUT VIC AB 2-0 CT1 27 (SUTURE) ×4
SUT VIC AB 2-0 CT1 TAPERPNT 27 (SUTURE) IMPLANT
SUT VIC AB 2-0 CTX 27 (SUTURE) IMPLANT
SUT VIC AB 3-0 X1 27 (SUTURE) ×2 IMPLANT
SUTURE E-PAK OPEN HEART (SUTURE) ×4 IMPLANT
SYSTEM SAHARA CHEST DRAIN ATS (WOUND CARE) ×4 IMPLANT
TOWEL OR 17X24 6PK STRL BLUE (TOWEL DISPOSABLE) ×8 IMPLANT
TOWEL OR 17X26 10 PK STRL BLUE (TOWEL DISPOSABLE) ×8 IMPLANT
TRAY FOLEY IC TEMP SENS 14FR (CATHETERS) ×4 IMPLANT
TUBING INSUFFLATION 10FT LAP (TUBING) ×4 IMPLANT
UNDERPAD 30X30 INCONTINENT (UNDERPADS AND DIAPERS) ×4 IMPLANT
WATER STERILE IRR 1000ML POUR (IV SOLUTION) ×8 IMPLANT

## 2013-04-07 NOTE — Progress Notes (Signed)
ANTICOAGULATION CONSULT NOTE - Follow Up Consult  Pharmacy Consult for heparin Indication: CAD awaiting CABG  Labs:  Recent Labs  04/04/13 1035  04/04/13 2200 04/05/13 0158 04/05/13 0630  04/05/13 0800 04/05/13 1335 04/06/13 0440 04/06/13 1345 04/06/13 2232  HGB 9.1*  --   --   --   --   --  9.4*  --  9.0*  --   --   HCT 28.1*  --   --   --   --   --  29.7*  --  28.3*  --   --   PLT 144*  --   --   --   --   --  146*  --  150  --   --   LABPROT  --   --   --   --   --   --   --  13.6  --   --   --   INR  --   --   --   --   --   --   --  1.06  --   --   --   HEPARINUNFRC  --   --   --   --   --   < > 0.33  --  <0.10* 0.13* 0.34  CREATININE 0.88  --   --   --  0.99  --   --   --  0.78  --   --   TROPONINI <0.30  < > 0.38* 0.32* <0.30  --   --   --   --   --   --   < > = values in this interval not displayed.   Assessment/Plan:  63yo male now therapeutic on heparin after rate increases.  Will continue gtt at current rate until off for first-case CABG.  Wynona Neat, PharmD, BCPS  04/07/2013,12:05 AM

## 2013-04-07 NOTE — Procedures (Signed)
Extubation Procedure Note  Patient Details:   Name: Leonard [name] DOB: 1950-04-17 MRN: [id number]   Airway Documentation:     Evaluation  O2 sats: stable throughout Complications: No apparent complications Patient did tolerate procedure well. Bilateral Breath Sounds: Clear Suctioning: Airway Yes  Mindi Slicker 04/07/2013, 8:50 PM

## 2013-04-07 NOTE — Anesthesia Postprocedure Evaluation (Signed)
  Anesthesia Post-op Note  Patient: Leonard [name]  Procedure(s) Performed: Procedure(s): CORONARY ARTERY BYPASS GRAFTING (CABG) (N/A) INTRAOPERATIVE TRANSESOPHAGEAL ECHOCARDIOGRAM (N/A)  Patient Location: ICU  Anesthesia Type:General  Level of Consciousness: Patient remains intubated per anesthesia plan  Airway and Oxygen Therapy: Patient remains intubated per anesthesia plan  Post-op Pain: none  Post-op Assessment: Post-op Vital signs reviewed, Patient's Cardiovascular Status Stable, Respiratory Function Stable, No signs of Nausea or vomiting and Pain level controlled  Post-op Vital Signs: Reviewed and stable  Complications: No apparent anesthesia complications

## 2013-04-07 NOTE — Brief Op Note (Signed)
      [address]       ,[place] [address]             [phone]     04/04/2013 - 04/07/2013  10:53 AM  PATIENT:  Leonard [name]  63 y.o. male  PRE-OPERATIVE DIAGNOSIS:  unstable angina, coronary artery disease  POST-OPERATIVE DIAGNOSIS:  Unstable angina, Coronary artery disease  PROCEDURE:  Procedure(s): CORONARY ARTERY BYPASS GRAFTING (CABG)X3 LIMA-LAD; SVG-RCA; SVG-OM INTRAOPERATIVE TRANSESOPHAGEAL ECHOCARDIOGRAM [facility] LEFT THIGH  SURGEON:  Surgeon(s): Ivin Poot, MD  PHYSICIAN ASSISTANT: WAYNE GOLD PA-C  ANESTHESIA:   general  PATIENT CONDITION:  ICU - intubated and hemodynamically stable.  PRE-OPERATIVE WEIGHT: [id number]  COMPLICATIONS: NO KNOWN

## 2013-04-07 NOTE — Progress Notes (Signed)
The patient was examined and preop studies reviewed. There has been no change from the prior exam and the patient is ready for surgery.   Plan CABG on R [place] today

## 2013-04-07 NOTE — Progress Notes (Signed)
Family Medicine Teaching Service Daily Progress Note Intern Pager: [phone]  [name] Medical record number: [id number] Date of birth: [birth year] Age: 63 y.o. Gender: male  Primary Care Provider: Rosemarie Ax, MD Consultants: Cardiology   Code Status: Full   Pt Overview and Major Events to Date:  3/2: Admitted for pulmonary edema; Elevated troponin heparin drip started  3/3: Cath - 3 vessel obstructive CAD,  3/4: CABG scheduled for March 5.   3/5: CABG   Assessment and Plan: Leonard [name] is a 63 y.o. male presenting with acute pulmonary edema/volume overlead. PMH is significant for uncontrolled DM-2, Anemia, HTN, HLD, PAD s/p angioplasty of superficial femoral artery, recent osteomyelitis (right foot).   #Acute diastolic CHF/Acute Pulmonary Edema (acute respiratory failure) - Breathing well with 2L [place]. 5.9 output since admission. 218>217 lbs.  - 2D echo: 55-60%, moderate LVH - Continuing home Lisinopril, Clonidine.  - Coreg 12.5 mg BID  - holding norvasc due to edema.   - IV Lasix 80 mg TID  - Daily weights: 231 lbs>218  # NSTEMI: CABG today  - Cards recs:  CABG March 5.  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 Megan Salon regarding osteo  - WOC: continued f/u by home health dressing. Regular appt with podiatrist on outpt   - He has completed course of Bactrim  - Dr. Megan Salon (ID) to follow on D/c   #Hypokalemia: resolved  - asymptomatic   #HTN: uncontrolled  - Continuing home Lisinopril, Clonidine. Holding Beta blocker due to acute decompensation.   #HLD: stable  - Continuing statin (using hospital formulary.   FEN/GI: Heart Healthy, Carb modified diet. SL IV.  Prophylaxis: Heparin SQ  Disposition: pending improvement   Subjective:  patient is in surgery   Objective: Temp:  [97.9 F (36.6 C)-98.3 F (36.8 C)] 98.3 F (36.8 C) (03/05 0522) Pulse Rate:  [79-81] 81 (03/05 0522) Resp:  [18] 18 (03/05 0522) BP: (155-175)/(74-87) 173/82 mmHg (03/05 0522) SpO2:  [94 %-97 %] 95 % (03/05 0522) Weight:  [215 lb 11.2 oz (97.841 kg)] 215 lb 11.2 oz (97.841 kg) (03/05 0522) Filed Weights   04/05/13 1349 04/06/13 0650 04/07/13 0522  Weight: 218 lb 14.4 oz (99.292 kg) 217 lb 4.8 oz (98.567 kg) 215 lb 11.2 oz (97.841 kg)    Physical Exam: *Unable to examine as patient is in surgery   Laboratory:  Recent Labs Lab 04/05/13 0800 04/06/13 0440 04/07/13 0500  WBC 5.4 3.7* 4.0  HGB 9.4* 9.0* 9.2*  HCT 29.7* 28.3* 28.5*  PLT 146* 150 133*    Recent Labs Lab 04/05/13 0630 04/06/13 0440 04/07/13 0500  NA 148* 142 144  K 3.5* 3.5* 3.9  CL 105 102 103  CO2 30 25 22   BUN 14 10 13   CREATININE 0.99 0.78 0.91  CALCIUM 9.1 8.8 9.1  PROT  --  6.8  --   BILITOT  --  0.2*  --   ALKPHOS  --  91  --   ALT  --  27  --   AST  --  20  --   GLUCOSE 142* 255* 209*    Recent Labs Lab 04/06/13 0641 04/06/13 1256 04/06/13 1706 04/06/13 2110 04/07/13 0557  GLUCAP 262* 267* 178*  196* 182*      Recent Labs Lab 04/04/13 1035 04/04/13 1501 04/04/13 2200 04/05/13 0158 04/05/13 0630  TROPONINI <0.30 <0.30 0.38* 0.32* <0.30     Imaging/Diagnostic Tests:   Rosemarie Ax, MD 04/07/2013, 9:21 AM PGY-1, [facility] [place] Intern pager: [phone], text pages welcome

## 2013-04-07 NOTE — Anesthesia Preprocedure Evaluation (Addendum)
Anesthesia Evaluation  Patient identified by MRN, date of birth, ID band Patient awake    Reviewed: Allergy & Precautions, H&P , NPO status , Patient's Chart, lab work & pertinent test results  History of Anesthesia Complications Negative for: history of anesthetic complications  Airway Mallampati: II TM Distance: >3 FB Neck ROM: Full    Dental  (+)    Pulmonary neg COPDformer smoker,  breath sounds clear to auscultation        Cardiovascular hypertension, Pt. on medications and Pt. on home beta blockers + CAD, + Peripheral Vascular Disease and +CHF Rhythm:Regular Rate:Normal     Neuro/Psych    GI/Hepatic negative GI ROS, Neg liver ROS,   Endo/Other  diabetes, Type 2, Insulin Dependent  Renal/GU negative Renal ROS     Musculoskeletal   Abdominal   Peds  Hematology  (+) anemia ,   Anesthesia Other Findings   Reproductive/Obstetrics                          Anesthesia Physical Anesthesia Plan  ASA: IV  Anesthesia Plan: General   Post-op Pain Management:    Induction: Intravenous  Airway Management Planned: Oral ETT  Additional Equipment: Arterial line, TEE, CVP, PA Cath, 3D TEE and Ultrasound Guidance Line Placement  Intra-op Plan:   Post-operative Plan: Extubation in OR  Informed Consent: I have reviewed the patients History and Physical, chart, labs and discussed the procedure including the risks, benefits and alternatives for the proposed anesthesia with the patient or authorized representative who has indicated his/her understanding and acceptance.   Dental advisory given  Plan Discussed with: CRNA and Surgeon  Anesthesia Plan Comments:         Anesthesia Quick Evaluation

## 2013-04-07 NOTE — Progress Notes (Signed)
Patient's care discussed with resident team, I agree with Dr Raeford Razor' assessment and plan for today.  Dalbert Mayotte, MD

## 2013-04-07 NOTE — Progress Notes (Signed)
S/p CABG x 3  Still intubated  Failed 1st attempt to wean secondary to agitation  BP 125/78  Pulse 84  Temp(Src) 98.6 F (37 C) (Core (Comment))  Resp 21  Ht 5\' 8"  (1.727 m)  Wt 215 lb 11.2 oz (97.841 kg)  BMI 32.80 kg/m2  SpO2 100%  CO= 4.5   Intake/Output Summary (Last 24 hours) at 04/07/13 1835 Last data filed at 04/07/13 1800  Gross per 24 hour  Intake 3981.2 ml  Output   4390 ml  Net -408.8 ml    Doing well early postop. Wean vent as tolerated

## 2013-04-07 NOTE — Transfer of Care (Signed)
Immediate Anesthesia Transfer of Care Note  Patient: Leonard [name]  Procedure(s) Performed: Procedure(s): CORONARY ARTERY BYPASS GRAFTING (CABG) (N/A) INTRAOPERATIVE TRANSESOPHAGEAL ECHOCARDIOGRAM (N/A)  Patient Location: SICU  Anesthesia Type:General  Level of Consciousness: Patient remains intubated per anesthesia plan  Airway & Oxygen Therapy: Patient remains intubated per anesthesia plan and Patient placed on Ventilator (see vital sign flow sheet for setting)  Post-op Assessment: Report given to PACU RN and Post -op Vital signs reviewed and stable  Post vital signs: Reviewed and stable  Complications: No apparent anesthesia complications

## 2013-04-08 ENCOUNTER — Inpatient Hospital Stay (HOSPITAL_COMMUNITY): Payer: Medicaid Other

## 2013-04-08 ENCOUNTER — Encounter (HOSPITAL_COMMUNITY): Payer: Self-pay | Admitting: Cardiothoracic Surgery

## 2013-04-08 LAB — BLOOD GAS, ARTERIAL
Acid-base deficit: 1 mmol/L (ref 0.0–2.0)
Bicarbonate: 23 mEq/L (ref 20.0–24.0)
Drawn by: 252031
O2 Content: 3 L/min
O2 Saturation: 91.9 %
Patient temperature: 98.6
TCO2: 24.2 mmol/L (ref 0–100)
pCO2 arterial: 37.5 mmHg (ref 35.0–45.0)
pH, Arterial: 7.406 (ref 7.350–7.450)
pO2, Arterial: 64.8 mmHg — ABNORMAL LOW (ref 80.0–100.0)

## 2013-04-08 LAB — GLUCOSE, CAPILLARY
GLUCOSE-CAPILLARY: 106 mg/dL — AB (ref 70–99)
GLUCOSE-CAPILLARY: 107 mg/dL — AB (ref 70–99)
GLUCOSE-CAPILLARY: 116 mg/dL — AB (ref 70–99)
GLUCOSE-CAPILLARY: 162 mg/dL — AB (ref 70–99)
GLUCOSE-CAPILLARY: 176 mg/dL — AB (ref 70–99)
Glucose-Capillary: 108 mg/dL — ABNORMAL HIGH (ref 70–99)
Glucose-Capillary: 109 mg/dL — ABNORMAL HIGH (ref 70–99)
Glucose-Capillary: 110 mg/dL — ABNORMAL HIGH (ref 70–99)
Glucose-Capillary: 119 mg/dL — ABNORMAL HIGH (ref 70–99)
Glucose-Capillary: 119 mg/dL — ABNORMAL HIGH (ref 70–99)
Glucose-Capillary: 195 mg/dL — ABNORMAL HIGH (ref 70–99)

## 2013-04-08 LAB — CBC
HCT: 30.7 % — ABNORMAL LOW (ref 39.0–52.0)
HCT: 31.9 % — ABNORMAL LOW (ref 39.0–52.0)
Hemoglobin: 10 g/dL — ABNORMAL LOW (ref 13.0–17.0)
Hemoglobin: 10.4 g/dL — ABNORMAL LOW (ref 13.0–17.0)
MCH: 26.7 pg (ref 26.0–34.0)
MCH: 27.2 pg (ref 26.0–34.0)
MCHC: 32.6 g/dL (ref 30.0–36.0)
MCHC: 32.6 g/dL (ref 30.0–36.0)
MCV: 82.1 fL (ref 78.0–100.0)
MCV: 83.3 fL (ref 78.0–100.0)
PLATELETS: 136 10*3/uL — AB (ref 150–400)
Platelets: 146 10*3/uL — ABNORMAL LOW (ref 150–400)
RBC: 3.74 MIL/uL — AB (ref 4.22–5.81)
RBC: 3.83 MIL/uL — ABNORMAL LOW (ref 4.22–5.81)
RDW: 17.1 % — AB (ref 11.5–15.5)
RDW: 17.9 % — ABNORMAL HIGH (ref 11.5–15.5)
WBC: 9.5 10*3/uL (ref 4.0–10.5)
WBC: 12.2 10*3/uL — ABNORMAL HIGH (ref 4.0–10.5)

## 2013-04-08 LAB — POCT I-STAT, CHEM 8
BUN: 20 mg/dL (ref 6–23)
CALCIUM ION: 1.12 mmol/L — AB (ref 1.13–1.30)
Chloride: 105 mEq/L (ref 96–112)
Creatinine, Ser: 1.6 mg/dL — ABNORMAL HIGH (ref 0.50–1.35)
GLUCOSE: 217 mg/dL — AB (ref 70–99)
HEMATOCRIT: 33 % — AB (ref 39.0–52.0)
HEMOGLOBIN: 11.2 g/dL — AB (ref 13.0–17.0)
Potassium: 4.2 mEq/L (ref 3.7–5.3)
Sodium: 140 mEq/L (ref 137–147)
TCO2: 19 mmol/L (ref 0–100)

## 2013-04-08 LAB — BASIC METABOLIC PANEL
BUN: 15 mg/dL (ref 6–23)
CHLORIDE: 109 meq/L (ref 96–112)
CO2: 23 meq/L (ref 19–32)
CREATININE: 1.29 mg/dL (ref 0.50–1.35)
Calcium: 7.8 mg/dL — ABNORMAL LOW (ref 8.4–10.5)
GFR calc Af Amer: 68 mL/min — ABNORMAL LOW (ref >90)
GFR calc non Af Amer: 58 mL/min — ABNORMAL LOW (ref >90)
Glucose, Bld: 110 mg/dL — ABNORMAL HIGH (ref 70–99)
POTASSIUM: 4.5 meq/L (ref 3.7–5.3)
SODIUM: 144 meq/L (ref 137–147)

## 2013-04-08 LAB — PREPARE PLATELET PHERESIS: Unit division: 0

## 2013-04-08 LAB — MAGNESIUM
Magnesium: 2.4 mg/dL (ref 1.5–2.5)
Magnesium: 2.4 mg/dL (ref 1.5–2.5)

## 2013-04-08 LAB — CREATININE, SERUM
CREATININE: 1.42 mg/dL — AB (ref 0.50–1.35)
GFR calc non Af Amer: 52 mL/min — ABNORMAL LOW (ref >90)
GFR, EST AFRICAN AMERICAN: 60 mL/min — AB (ref >90)

## 2013-04-08 MED ORDER — METOPROLOL TARTRATE 25 MG PO TABS
25.0000 mg | ORAL_TABLET | Freq: Two times a day (BID) | ORAL | Status: DC
  Administered 2013-04-08 – 2013-04-20 (×24): 25 mg via ORAL
  Filled 2013-04-08 (×26): qty 1

## 2013-04-08 MED ORDER — METOPROLOL TARTRATE 25 MG/10 ML ORAL SUSPENSION
12.5000 mg | Freq: Two times a day (BID) | ORAL | Status: DC
  Filled 2013-04-08 (×4): qty 5

## 2013-04-08 MED ORDER — CLONIDINE HCL 0.1 MG PO TABS
0.1000 mg | ORAL_TABLET | Freq: Two times a day (BID) | ORAL | Status: DC
  Administered 2013-04-08 – 2013-04-11 (×6): 0.1 mg via ORAL
  Filled 2013-04-08 (×8): qty 1

## 2013-04-08 MED ORDER — INSULIN ASPART 100 UNIT/ML ~~LOC~~ SOLN
0.0000 [IU] | SUBCUTANEOUS | Status: DC
  Administered 2013-04-08 – 2013-04-09 (×5): 4 [IU] via SUBCUTANEOUS
  Administered 2013-04-09: 8 [IU] via SUBCUTANEOUS
  Administered 2013-04-09: 2 [IU] via SUBCUTANEOUS
  Administered 2013-04-09: 4 [IU] via SUBCUTANEOUS
  Administered 2013-04-09: 8 [IU] via SUBCUTANEOUS
  Administered 2013-04-10 (×2): 2 [IU] via SUBCUTANEOUS
  Administered 2013-04-10: 4 [IU] via SUBCUTANEOUS

## 2013-04-08 MED ORDER — DOPAMINE-DEXTROSE 3.2-5 MG/ML-% IV SOLN
0.0000 ug/kg/min | INTRAVENOUS | Status: DC
  Administered 2013-04-08: 2.5 ug/kg/min via INTRAVENOUS
  Filled 2013-04-08: qty 250

## 2013-04-08 MED ORDER — FUROSEMIDE 10 MG/ML IJ SOLN
40.0000 mg | Freq: Once | INTRAMUSCULAR | Status: AC
  Administered 2013-04-08: 40 mg via INTRAVENOUS
  Filled 2013-04-08: qty 4

## 2013-04-08 MED ORDER — VANCOMYCIN HCL IN DEXTROSE 1-5 GM/200ML-% IV SOLN
1000.0000 mg | Freq: Once | INTRAVENOUS | Status: AC
  Administered 2013-04-08: 1000 mg via INTRAVENOUS
  Filled 2013-04-08: qty 200

## 2013-04-08 MED ORDER — INSULIN ASPART 100 UNIT/ML ~~LOC~~ SOLN
3.0000 [IU] | Freq: Three times a day (TID) | SUBCUTANEOUS | Status: DC
  Administered 2013-04-10 – 2013-04-19 (×21): 3 [IU] via SUBCUTANEOUS

## 2013-04-08 MED ORDER — INSULIN DETEMIR 100 UNIT/ML ~~LOC~~ SOLN
14.0000 [IU] | Freq: Two times a day (BID) | SUBCUTANEOUS | Status: DC
  Administered 2013-04-08 (×2): 14 [IU] via SUBCUTANEOUS
  Filled 2013-04-08 (×4): qty 0.14

## 2013-04-08 MED ORDER — TRAMADOL HCL 50 MG PO TABS
50.0000 mg | ORAL_TABLET | Freq: Four times a day (QID) | ORAL | Status: DC | PRN
  Administered 2013-04-08 – 2013-04-18 (×5): 50 mg via ORAL
  Filled 2013-04-08 (×5): qty 1

## 2013-04-08 MED ORDER — FUROSEMIDE 10 MG/ML IJ SOLN
20.0000 mg | Freq: Two times a day (BID) | INTRAMUSCULAR | Status: DC
  Administered 2013-04-09: 20 mg via INTRAVENOUS
  Filled 2013-04-08 (×3): qty 2

## 2013-04-08 MED FILL — Heparin Sodium (Porcine) Inj 1000 Unit/ML: INTRAMUSCULAR | Qty: 30 | Status: AC

## 2013-04-08 MED FILL — Potassium Chloride Inj 2 mEq/ML: INTRAVENOUS | Qty: 40 | Status: AC

## 2013-04-08 MED FILL — Magnesium Sulfate Inj 50%: INTRAMUSCULAR | Qty: 10 | Status: AC

## 2013-04-08 NOTE — Progress Notes (Signed)
Post op CABG Lasix for volume overload. Will follow. NSR.

## 2013-04-08 NOTE — Op Note (Signed)
NAMERAYQUAN, VIAU NO.:  [id number]  MEDICAL RECORD NO.:  [id number]  LOCATION:  2S15C                        FACILITY:  [facility]  PHYSICIAN:  Ivin Poot, M.D.  DATE OF BIRTH:  [birth year]  DATE OF PROCEDURE:  04/07/2013 DATE OF DISCHARGE:                              OPERATIVE REPORT   OPERATION: 1. Coronary bypass grafting x3 (left internal mammary artery to left     anterior descending, saphenous vein graft to obtuse marginal,     saphenous vein graft to posterior descending). 2. Endoscopic harvest of right leg greater saphenous vein.  PREOPERATIVE DIAGNOSES:  Unstable angina with severe 3-vessel coronary artery disease, poorly controlled diabetes with heavy smoking history.  POSTOPERATIVE DIAGNOSES:  Unstable angina with severe 3-vessel coronary artery disease, poorly controlled diabetes with heavy smoking history.  SURGEON:  Ivin Poot, MD  ASSISTANT:  John Giovanni, PA-C  ANESTHESIA:  General by Dr. Ermalene Postin.  INDICATION:  The patient is a 63 year old Afro-American male with poorly controlled diabetes, hypertension, and heavy smoking history.  He presented with extreme shortness of breath and mildly elevated cardiac enzymes.  His chest x-ray showed pulmonary edema and Cardiology consultation recommended echocardiogram and cardiac catheterization. The echocardiogram demonstrated LVH with diastolic dysfunction but preserved LV systolic function, aortic sclerosis without stenosis.  No significant mitral valve disease.  Cardiac catheterization by Dr. Martinique demonstrated high-grade stenosis of the LAD, high-grade stenosis of the circumflex, and high-grade stenosis of the proximal RCA.  Surgical coronary revascularization was recommended.  I reviewed the results of the cardiac cath and echo with the patient and family and discussed the indications and expected benefits of multivessel CABG for treatment of his severe multivessel CAD.  I discussed  the major aspects of surgery including the location of the surgical incisions, use of general anesthesia and cardiopulmonary bypass, and the expected postoperative hospital recovery.  I discussed with the patient the risks to him of CABG including risks of stroke, MI, bleeding, blood transfusion requirement, infection, postoperative pleural effusions, postoperative pulmonary complications of pneumonia and ventilator dependence, arrhythmias, and death.  After reviewing these issues, he demonstrated his understanding and agreed to proceed with surgery under what I felt was an informed consent.  OPERATIVE FINDINGS: 1. Severely diffusely diseased coronaries, would not be a candidate     for redo CABG. 2. Baseline preoperative anemia requiring 2 units of packed cells     during the procedure. 3. Preserved LV systolic function by transesophageal echo at the     termination of the procedure.  OPERATIVE PROCEDURE:  The patient was brought to the operating room and placed supine on the operating table where general anesthesia was induced under invasive hemodynamic monitoring.  The chest, abdomen, and legs were prepped with Betadine and draped as a sterile field.  A proper time-out was performed.  A sternal incision was made as the saphenous vein was harvested endoscopically from the left leg.  The vein was exposed and the right leg was not adequate for use.  The left internal mammary artery was harvested as a pedicle graft from its origin at the subclavian vessels.  It was a 1.5-mm vessel with good flow.  The  sternal retractor was placed and the pericardium was opened and suspended.  The aorta was inspected, palpated, and examined.  There were no significant areas of calcification.  Heparin was administered and pursestrings were placed in the ascending aorta and right atrium. After the ACT was documented as being therapeutic, the patient was cannulated and placed on cardiopulmonary bypass.   The coronary arteries were identified for grafting and the targets were difficult.  The mammary artery and vein grafts were prepared for the distal anastomoses and cardioplegia cannulas were placed for both antegrade aortic and retrograde coronary sinus cardioplegia.  The patient was cooled to 32 degrees.  The aortic crossclamp was applied.  One liter of cold blood cardioplegia was delivered in split doses between the antegrade aortic and retrograde coronary sinus catheters.  There was good cardioplegic arrest and septal temperature dropped less than 14 degrees. Cardioplegia was delivered every 20 minutes or less.  The distal coronary anastomoses were then performed.  The first distal anastomosis was the posterior descending branch of the right coronary. This was a 1.5-mm vessel proximal 90% stenosis.  A reverse saphenous vein was sewn end-to-side with running 7-0 Prolene with good flow through the graft.  The second distal anastomosis was the OM branch of the left circumflex. This was a 1.5-mm vessel proximal 90-95% stenosis.  A reverse saphenous vein was sewn end-to-side with running 7-0 Prolene with good flow through the graft.  Cardioplegia was redosed.  The third distal anastomosis was to the distal LAD.  The LAD was heavily calcified and the anastomosis could not be constructed any further proximally.  The left IMA was brought through an opening in the left lateral pericardium, was brought down onto the LAD and sewn end-to-side with running 8-0 Prolene with good flow through the anastomosis after briefly releasing the pedicle bulldog on the mammary artery.  The bulldog was reapplied, the pedicle was secured to the epicardium with 6- 0 Prolenes.  Cardioplegia was redosed.  The crossclamp was still in place.  The 2 proximal aortic anastomoses were performed using a 4.5-mm punch running 6-0 Prolene.  Prior to tying down the final proximal anastomosis, air was vented from the  coronaries with a dose of retrograde warm blood cardioplegia.  The crossclamp was removed.  The heart was cardioverted back to a regular rhythm.  The vein grafts were de-aired and opened, each had good flow and hemostasis was documented at the proximal and distal anastomoses.  The cardioplegia cannula was removed and temporary pacing wires were applied.  The patient was rewarmed and the lungs were expanded and the ventilator resumed.  The patient was weaned off cardiopulmonary bypass without inotropes with stable hemodynamics.  Echocardiogram showed good LV function.  Protamine was administered without adverse reaction.  The cannulas were removed. The mediastinum was irrigated.  The platelet count was 80,000 and platelet transfusion improved coagulation function.  The superior pericardial fat was closed over the aorta.  An anterior mediastinal and left pleural chest tube were placed and brought out through separate incisions.  The sternum was closed with interrupted wire.  The pectoralis fascia was closed with a running #1 Vicryl.  The subcutaneous and skin layers were closed with a running Vicryl.  Total cardiopulmonary bypass time was 101 minutes.     Ivin Poot, M.D.     PV/MEDQ  D:  04/07/2013  T:  04/08/2013  Job:  [id number]  cc:   Peter M. Martinique, M.D.

## 2013-04-08 NOTE — Progress Notes (Addendum)
TCTS BRIEF SICU PROGRESS NOTE  1 Day Post-Op  S/P Procedure(s) (LRB): CORONARY ARTERY BYPASS GRAFTING (CABG) (N/A) INTRAOPERATIVE TRANSESOPHAGEAL ECHOCARDIOGRAM (N/A)   Stable day NSR w/ stable BP on low dose dopamine O2 sats 90-91% on 4 L/min via [place] UOP adequate but not diuresing much yet Creatinine increased 1.4-1.6 Hgb stable  Plan: Continue current plan.  Watch renal function.  [name],Leonard H 04/08/2013 6:22 PM

## 2013-04-08 NOTE — Progress Notes (Signed)
FMTS Attending Note Patient seen and examined by me today, discussed with resident team and I agree with Dr Raeford Razor' note as documented.  Dalbert Mayotte, MD

## 2013-04-08 NOTE — Progress Notes (Signed)
1 Day Post-Op Procedure(s) (LRB): CORONARY ARTERY BYPASS GRAFTING (CABG) (N/A) INTRAOPERATIVE TRANSESOPHAGEAL ECHOCARDIOGRAM (N/A) Subjective: S/p CABG X3 Copd, htn Extubated, CXR wet, lasix ordered  Objective: Vital signs in last 24 hours: Temp:  [97.9 F (36.6 C)-99.1 F (37.3 C)] 99.1 F (37.3 C) (03/06 0630) Pulse Rate:  [74-91] 81 (03/06 0600) Cardiac Rhythm:  [-] Atrial paced (03/06 0500) Resp:  [12-26] 15 (03/06 0630) BP: (89-137)/(57-90) 102/72 mmHg (03/06 0600) SpO2:  [94 %-100 %] 96 % (03/06 0600) Arterial Line BP: (92-152)/(51-84) 92/51 mmHg (03/06 0630) FiO2 (%):  [40 %-100 %] 40 % (03/05 2009) Weight:  [228 lb 6.3 oz (103.6 kg)] 228 lb 6.3 oz (103.6 kg) (03/06 0500)  Hemodynamic parameters for last 24 hours: PAP: (26-39)/(14-23) 36/20 mmHg CO:  [4.1 L/min-6.3 L/min] 4.9 L/min CI:  [2 L/min/m2-3 L/min/m2] 2.3 L/min/m2  Intake/Output from previous day: 03/05 0701 - 03/06 0700 In: 5194.7 [I.V.:3891.7; Blood:803; NG/GT:50; IV Piggyback:450] Out: [id number] [Urine:1915; Emesis/NG output:650; Blood:1200; Chest Tube:620] Intake/Output this shift:   Alert and comfortable Lungs w/ rhonchi extrem warm   Lab Results:  Recent Labs  04/07/13 1900 04/07/13 2010 04/08/13 0359  WBC 8.0  --  9.5  HGB 10.8* 11.2* 10.0*  HCT 32.5* 33.0* 30.7*  PLT 149*  --  146*   BMET:  Recent Labs  04/07/13 0500  04/07/13 2010 04/08/13 0359  NA 144  < > 144 144  K 3.9  < > 4.3 4.5  CL 103  --  106 109  CO2 22  --   --  23  GLUCOSE 209*  < > 139* 110*  BUN 13  --  11 15  CREATININE 0.91  < > 1.20 1.29  CALCIUM 9.1  --   --  7.8*  < > = values in this interval not displayed.  PT/INR:  Recent Labs  04/07/13 1255  LABPROT 15.3*  INR 1.24   ABG    Component Value Date/Time   PHART 7.406 04/08/2013 0356   HCO3 23.0 04/08/2013 0356   TCO2 24.2 04/08/2013 0356   ACIDBASEDEF 1.0 04/08/2013 0356   O2SAT 91.9 04/08/2013 0356   CBG (last 3)   Recent Labs  04/08/13 0506  04/08/13 0551 04/08/13 0647  GLUCAP 109* 107* 108*    Assessment/Plan: S/P Procedure(s) (LRB): CORONARY ARTERY BYPASS GRAFTING (CABG) (N/A) INTRAOPERATIVE TRANSESOPHAGEAL ECHOCARDIOGRAM (N/A)  DM- Levemir+ SSI Resume BP meds Chronic anemia- follow Hb  LOS: 4 days    Leonard [name],Leonard [name] 04/08/2013

## 2013-04-08 NOTE — Progress Notes (Signed)
Family Medicine Teaching Service Daily Progress Note Intern Pager: [phone]  [name] Medical record number: [id number] Date of birth: [birth year] Age: 63 y.o. Gender: male  Primary Care Provider: Rosemarie Ax, MD Consultants: Cardiology   Code Status: Full   Pt Overview and Major Events to Date:  3/2: Admitted for pulmonary edema; Elevated troponin heparin drip started  3/3: Cath - 3 vessel obstructive CAD 3/4: CABG scheduled for March 5.   3/5: s/p CABG with no complications   Assessment and Plan: Leonard [name] is a 63 y.o. male presenting with acute pulmonary edema/volume overlead. PMH is significant for uncontrolled DM-2, Anemia, HTN, HLD, PAD s/p angioplasty of superficial femoral artery, recent osteomyelitis (right foot).   #Acute diastolic CHF/Acute Pulmonary Edema (acute respiratory failure) -  5.7 output since admission. 218>217 lbs.>228 - Continuing home Lisinopril, Clonidine.  - holding Coreg 12.5 mg BID  - holding norvasc due to edema.   - IV Lasix 20 mg BID  - Daily weights: 231 lbs>218  # NSTEMI s/p CABG: tolerated well and recovering. Extubated.  - appreciate the care given by the cardiothoracic team.  - Versed PRN  - morphine PRN  - oxy Q3 PRN   #DM-2: Uncontrolled.  A1c: 8.7  - started on levimir 14 U BID after CABG  - Novolog 3 U with meals  - SSI Moderate   #Acute on chronic Anemia: ongoing and has been worked up previously. Most likely related to chronic disease as well as blood loss curing CABG - received 2 U during surgery  - monitor CBC.   #Diabetic Osteomyelitis; Recent amputation of right 4th and 5th toes: Healing well.  He has completed course of Bactrim - Patient has been following with ID, Leonard [name] regarding osteo  - WOC: continued f/u by home health dressing. Regular appt with podiatrist on outpt   - Leonard [name] (ID) to follow on D/c   #Hypokalemia: resolved  - asymptomatic   #HTN: uncontrolled  -  Continuing home Clonidine.  - holding Lisinopril, Coreg  - metoprolol 25 mg BID  - Metoprolol Q2 PRN high blood pressure, HR > 130   #HLD: stable  - Continuing statin (using hospital formulary.   FEN/GI: Heart Healthy, Carb modified diet. Lactated ringers KVO Prophylaxis: Heparin SQ  Disposition: pending improvement   Subjective: Patient feeling sore today after his surgery. He said he had no problems overnight. He was able to get some sleep.   Objective: Temp:  [97.9 F (36.6 C)-99.1 F (37.3 C)] 99 F (37.2 C) (03/06 0800) Pulse Rate:  [73-91] 73 (03/06 0800) Resp:  [12-30] 30 (03/06 0800) BP: (86-137)/(57-90) 94/61 mmHg (03/06 0805) SpO2:  [94 %-100 %] 97 % (03/06 0910) Arterial Line BP: (92-152)/(51-84) 105/58 mmHg (03/06 0800) FiO2 (%):  [40 %-100 %] 40 % (03/05 2009) Weight:  [228 lb 6.3 oz (103.6 kg)] 228 lb 6.3 oz (103.6 kg) (03/06 0500) Filed Weights   04/06/13 0650 04/07/13 0522 04/08/13 0500  Weight: 217 lb 4.8 oz (98.567 kg) 215 lb 11.2 oz (97.841 kg) 228 lb 6.3 oz (103.6 kg)    Physical Exam: General: African American gentleman, resting in bed, using supplemental O2 ([place]), does not appear in any acute distress.  HEENT: NCAT. No scleral icterus. MMM.  MSK: chest very tender to touch,  Skin: bandages are clear dry and intact post surgery   Neuro: AO x 3. No focal deficits.  Laboratory:  Recent Labs Lab 04/07/13 1255  04/07/13 1900 04/07/13  2010 04/08/13 0359  WBC 5.9  --  8.0  --  9.5  HGB 10.1*  < > 10.8* 11.2* 10.0*  HCT 31.6*  < > 32.5* 33.0* 30.7*  PLT 104*  --  149*  --  146*  < > = values in this interval not displayed.  Recent Labs Lab 04/06/13 0440 04/07/13 0500  04/07/13 1258 04/07/13 1900 04/07/13 2010 04/08/13 0359  NA 142 144  < > 141  --  144 144  K 3.5* 3.9  < > 3.8  --  4.3 4.5  CL 102 103  --   --   --  106 109  CO2 25 22  --   --   --   --  23  BUN 10 13  --   --   --  11 15  CREATININE 0.78 0.91  --   --  1.09 1.20 1.29   CALCIUM 8.8 9.1  --   --   --   --  7.8*  PROT 6.8  --   --   --   --   --   --   BILITOT 0.2*  --   --   --   --   --   --   ALKPHOS 91  --   --   --   --   --   --   ALT 27  --   --   --   --   --   --   AST 20  --   --   --   --   --   --   GLUCOSE 255* 209*  < > 124*  --  139* 110*  < > = values in this interval not displayed.  Recent Labs Lab 04/08/13 0257 04/08/13 0357 04/08/13 0506 04/08/13 0551 04/08/13 0647  GLUCAP 106* 110* 109* 107* 108*      Recent Labs Lab 04/04/13 1035 04/04/13 1501 04/04/13 2200 04/05/13 0158 04/05/13 0630  TROPONINI <0.30 <0.30 0.38* 0.32* <0.30     Imaging/Diagnostic Tests: - 2D echo: 55-60%, moderate LVH  Rosemarie Ax, MD 04/08/2013, 9:37 AM PGY-1, [place] Intern pager: [phone], text pages welcome

## 2013-04-08 NOTE — Progress Notes (Signed)
Cri 12 leaqd ekg performed. Critical value noted. Ruel Favors, RN notified

## 2013-04-08 NOTE — Progress Notes (Signed)
RT NOTE: Placed pt on Bipap due to decreased BBS and sat's. SPO2 61 on ABG on 100% NRB. Pt on 12/6 50% tolerating well at this time. RT will continue to monitor

## 2013-04-09 ENCOUNTER — Inpatient Hospital Stay (HOSPITAL_COMMUNITY): Payer: Medicaid Other

## 2013-04-09 LAB — BASIC METABOLIC PANEL
BUN: 26 mg/dL — ABNORMAL HIGH (ref 6–23)
CO2: 24 mEq/L (ref 19–32)
Calcium: 8.1 mg/dL — ABNORMAL LOW (ref 8.4–10.5)
Chloride: 102 mEq/L (ref 96–112)
Creatinine, Ser: 1.37 mg/dL — ABNORMAL HIGH (ref 0.50–1.35)
GFR calc Af Amer: 63 mL/min — ABNORMAL LOW (ref >90)
GFR calc non Af Amer: 54 mL/min — ABNORMAL LOW (ref >90)
Glucose, Bld: 231 mg/dL — ABNORMAL HIGH (ref 70–99)
Potassium: 4.5 mEq/L (ref 3.7–5.3)
Sodium: 139 mEq/L (ref 137–147)

## 2013-04-09 LAB — CBC
HCT: 29.5 % — ABNORMAL LOW (ref 39.0–52.0)
Hemoglobin: 9.7 g/dL — ABNORMAL LOW (ref 13.0–17.0)
MCH: 27.5 pg (ref 26.0–34.0)
MCHC: 32.9 g/dL (ref 30.0–36.0)
MCV: 83.6 fL (ref 78.0–100.0)
Platelets: 127 10*3/uL — ABNORMAL LOW (ref 150–400)
RBC: 3.53 MIL/uL — ABNORMAL LOW (ref 4.22–5.81)
RDW: 17.7 % — ABNORMAL HIGH (ref 11.5–15.5)
WBC: 10.2 10*3/uL (ref 4.0–10.5)

## 2013-04-09 LAB — GLUCOSE, CAPILLARY
GLUCOSE-CAPILLARY: 173 mg/dL — AB (ref 70–99)
GLUCOSE-CAPILLARY: 180 mg/dL — AB (ref 70–99)
Glucose-Capillary: 226 mg/dL — ABNORMAL HIGH (ref 70–99)
Glucose-Capillary: 214 mg/dL — ABNORMAL HIGH (ref 70–99)
Glucose-Capillary: 175 mg/dL — ABNORMAL HIGH (ref 70–99)
Glucose-Capillary: 166 mg/dL — ABNORMAL HIGH (ref 70–99)

## 2013-04-09 MED ORDER — FUROSEMIDE 10 MG/ML IJ SOLN
40.0000 mg | Freq: Four times a day (QID) | INTRAMUSCULAR | Status: AC
  Administered 2013-04-09: 40 mg via INTRAVENOUS

## 2013-04-09 MED ORDER — FUROSEMIDE 10 MG/ML IJ SOLN
20.0000 mg | Freq: Four times a day (QID) | INTRAMUSCULAR | Status: DC
  Administered 2013-04-09: 20 mg via INTRAVENOUS

## 2013-04-09 MED ORDER — INSULIN DETEMIR 100 UNIT/ML ~~LOC~~ SOLN
28.0000 [IU] | Freq: Two times a day (BID) | SUBCUTANEOUS | Status: DC
  Administered 2013-04-09 – 2013-04-11 (×6): 28 [IU] via SUBCUTANEOUS
  Filled 2013-04-09 (×8): qty 0.28

## 2013-04-09 MED ORDER — MORPHINE SULFATE 2 MG/ML IJ SOLN
2.0000 mg | INTRAMUSCULAR | Status: DC | PRN
  Administered 2013-04-11: 2 mg via INTRAVENOUS
  Filled 2013-04-09: qty 1

## 2013-04-09 NOTE — Progress Notes (Signed)
Pt pulling at Bipap mask, removed sats dropped to 80, replaced sats up to 95.

## 2013-04-09 NOTE — Progress Notes (Signed)
Pt request holiday on bipap, sats 91-97% on 4L [place].  Will continue to monitor closely.

## 2013-04-09 NOTE — Progress Notes (Signed)
Patient requesting to have bipap mask removed and attempting to remove mask himself. Bipap mask removed and patient placed on non-rebreather.  Patient complaining of pain; 10 mg oxycodone and scheduled tylenol given.  Patient then began to complain of not being able to move air.  Sats staying 88-91%.  RT called.  Patient placed back on bipap.  Patient educated on need for bipap mask to stay on and states he understands. Will continue to monitor.

## 2013-04-09 NOTE — Progress Notes (Addendum)
[address]       [place] [address]             [phone]        CARDIOTHORACIC SURGERY PROGRESS NOTE   R2 Days Post-Op Procedure(s) (LRB): CORONARY ARTERY BYPASS GRAFTING (CABG) (N/A) INTRAOPERATIVE TRANSESOPHAGEAL ECHOCARDIOGRAM (N/A)  Subjective: Progressively increased O2 requirements overnight, currently breathing comfortably on BiPAP w/ 50% FiO2.  Denies air hunger.  Mild soreness in chest.  No nausea, abdominal discomfort.  Objective: Vital signs: BP Readings from Last 1 Encounters:  04/09/13 130/71   Pulse Readings from Last 1 Encounters:  04/09/13 76   Resp Readings from Last 1 Encounters:  04/09/13 26   Temp Readings from Last 1 Encounters:  04/09/13 98 F (36.7 C) Axillary    Hemodynamics: PAP: (45)/(23) 45/23 mmHg  Physical Exam:  Rhythm:   sinus  Breath sounds: Few insp crackles, no wheezing  Heart sounds:  RRR  Incisions:  Dressing dry, intact  Abdomen:  Soft, non-distended, non-tender  Extremities:  Warm, well-perfused    Intake/Output from previous day: 03/06 0701 - 03/07 0700 In: 1876.6 [P.O.:580; I.V.:996.6; IV Piggyback:300] Out: 1195 [Urine:975; Chest Tube:220] Intake/Output this shift: Total I/O In: 99.8 [P.O.:30; I.V.:69.8] Out: 125 [Urine:125]  Lab Results:  CBC: Recent Labs  04/08/13 1700 04/09/13 0430  WBC 12.2* 10.2  HGB 10.4* 9.7*  HCT 31.9* 29.5*  PLT 136* 127*    BMET:  Recent Labs  04/08/13 0359 04/08/13 1645 04/08/13 1700 04/09/13 0430  NA 144 140  --  139  K 4.5 4.2  --  4.5  CL 109 105  --  102  CO2 23  --   --  24  GLUCOSE 110* 217*  --  231*  BUN 15 20  --  26*  CREATININE 1.29 1.60* 1.42* 1.37*  CALCIUM 7.8*  --   --  8.1*     CBG (last 3)   Recent Labs  04/08/13 1918 04/08/13 2322 04/09/13 0325  GLUCAP 176* 226* 214*    ABG    Component Value Date/Time   PHART 7.406 04/08/2013 0356   PCO2ART 37.5 04/08/2013 0356   PO2ART 64.8* 04/08/2013 0356   HCO3 23.0  04/08/2013 0356   TCO2 19 04/08/2013 1645   ACIDBASEDEF 1.0 04/08/2013 0356   O2SAT 91.9 04/08/2013 0356    CXR: PORTABLE CHEST - 1 VIEW  COMPARISON: DG CHEST 1V PORT dated 04/08/2013; DG CHEST 1V PORT dated  04/07/2013; DG CHEST 2 VIEW dated 04/06/2013  FINDINGS:  Left chest tube and mediastinal drain have been removed. The  Swan-Ganz catheter has also been removed with a right jugular  central line left in place. The tip of this catheter is in the lower  SVC. Patchy bilateral atelectasis present in both lower lungs, left  greater than right. No edema, pneumothorax or significant pleural  fluid is identified. There is stable cardiomegaly.  IMPRESSION:  No pneumothorax. Bilateral lower lung atelectasis, left greater than  right.  Electronically Signed  By: Aletta Edouard M.D.  On: 04/09/2013 09:16    Assessment/Plan: S/P Procedure(s) (LRB): CORONARY ARTERY BYPASS GRAFTING (CABG) (N/A) INTRAOPERATIVE TRANSESOPHAGEAL ECHOCARDIOGRAM (N/A)  Overall stable POD2 Maintaining NSR w/ stable BP on low dose dopamine Acute resp failure w/ [name]/o preop COPD, tobacco abuse, stable on BiPAP at present Expected post op acute blood loss anemia, mild, stable Expected post op volume excess Expected post op atelectasis, mild Acute on chronic renal insufficiency, likely due to acute  kidney injury w/ pre-renal azotemia, mild, resolving w/ creatinine decreased this am Longstanding HTN Type II diabetes mellitus, CBG's trending up   Mobilize  Diuresis  Wean dopamine when BP improves  Bronchodilators  Pulm toilet  Keep foley today to monitor UOP  Increase Levemir   Leonard [name] 04/09/2013 9:36 AM

## 2013-04-09 NOTE — Progress Notes (Signed)
Pt removing bipap mask himself.

## 2013-04-09 NOTE — Progress Notes (Signed)
Family Medicine Social visit Primary team: Cardiothoracic surgery  S: 2 days post-op CABG, in SICU, pulled BiPAP off and became hypoxemic to 80%, re-placed.  O: BP 121/73  Pulse 71  Temp(Src) 97.6 F (36.4 C) (Axillary)  Resp 23  Ht 5\' 8"  (1.727 m)  Wt 233 lb 8 oz (105.915 kg)  BMI 35.51 kg/m2  SpO2 96% GEN: Lying in bed  PULM: BiPAP in place. Breathing comfortably.  A/P: 63 y.o. male 2 days s/p CABG, stable. Hypoxemic and with mild AKI likely due to hypoperfusion/prerenal. Cardiothoracic surgery managing through d/c. Continuing diuresis for fluid overload and planning pulmonary toilet and mobilization. We appreciate their care of this patient.  [name], MD

## 2013-04-09 NOTE — Progress Notes (Addendum)
TCTS BRIEF SICU PROGRESS NOTE  2 Days Post-Op  S/P Procedure(s) (LRB): CORONARY ARTERY BYPASS GRAFTING (CABG) (N/A) INTRAOPERATIVE TRANSESOPHAGEAL ECHOCARDIOGRAM (N/A)   Stable day Maintaining NSR w/ stable BP Breathing better w/ O2 sats 94-97% on 6 L/min via [place] UOP adequate but not great  Plan: Continue current plan.  Will increase lasix dose.  Stephinie Battisti H 04/09/2013 5:15 PM

## 2013-04-09 NOTE — Progress Notes (Signed)
RN called RT to room at this time. RN had tried pt on NRB. Pt stated he didn't feel like he could move air so patient was placed back on BIPAP at this time. RT will continue to monitor.

## 2013-04-10 ENCOUNTER — Inpatient Hospital Stay (HOSPITAL_COMMUNITY): Payer: Medicaid Other

## 2013-04-10 LAB — BASIC METABOLIC PANEL
BUN: 35 mg/dL — ABNORMAL HIGH (ref 6–23)
CALCIUM: 8 mg/dL — AB (ref 8.4–10.5)
CO2: 23 mEq/L (ref 19–32)
Chloride: 100 mEq/L (ref 96–112)
Creatinine, Ser: 1.23 mg/dL (ref 0.50–1.35)
GFR calc non Af Amer: 62 mL/min — ABNORMAL LOW (ref >90)
GFR, EST AFRICAN AMERICAN: 72 mL/min — AB (ref >90)
Glucose, Bld: 150 mg/dL — ABNORMAL HIGH (ref 70–99)
Potassium: 3.8 mEq/L (ref 3.7–5.3)
SODIUM: 137 meq/L (ref 137–147)

## 2013-04-10 LAB — CBC
HCT: 27.9 % — ABNORMAL LOW (ref 39.0–52.0)
Hemoglobin: 9.5 g/dL — ABNORMAL LOW (ref 13.0–17.0)
MCH: 28.5 pg (ref 26.0–34.0)
MCHC: 34.1 g/dL (ref 30.0–36.0)
MCV: 83.8 fL (ref 78.0–100.0)
Platelets: 100 10*3/uL — ABNORMAL LOW (ref 150–400)
RBC: 3.33 MIL/uL — ABNORMAL LOW (ref 4.22–5.81)
RDW: 17.9 % — AB (ref 11.5–15.5)
WBC: 7.7 10*3/uL (ref 4.0–10.5)

## 2013-04-10 LAB — TYPE AND SCREEN
ABO/RH(D): B NEG
Antibody Screen: NEGATIVE
Unit division: 0
Unit division: 0
Unit division: 0
Unit division: 0
Unit division: 0
Unit division: 0

## 2013-04-10 LAB — GLUCOSE, CAPILLARY
GLUCOSE-CAPILLARY: 147 mg/dL — AB (ref 70–99)
GLUCOSE-CAPILLARY: 154 mg/dL — AB (ref 70–99)
GLUCOSE-CAPILLARY: 173 mg/dL — AB (ref 70–99)
Glucose-Capillary: 106 mg/dL — ABNORMAL HIGH (ref 70–99)
Glucose-Capillary: 118 mg/dL — ABNORMAL HIGH (ref 70–99)
Glucose-Capillary: 129 mg/dL — ABNORMAL HIGH (ref 70–99)
Glucose-Capillary: 137 mg/dL — ABNORMAL HIGH (ref 70–99)
Glucose-Capillary: 148 mg/dL — ABNORMAL HIGH (ref 70–99)
Glucose-Capillary: 206 mg/dL — ABNORMAL HIGH (ref 70–99)
Glucose-Capillary: 170 mg/dL — ABNORMAL HIGH (ref 70–99)
Glucose-Capillary: 121 mg/dL — ABNORMAL HIGH (ref 70–99)

## 2013-04-10 MED ORDER — INSULIN ASPART 100 UNIT/ML ~~LOC~~ SOLN
0.0000 [IU] | Freq: Three times a day (TID) | SUBCUTANEOUS | Status: DC
  Administered 2013-04-10: 3 [IU] via SUBCUTANEOUS
  Administered 2013-04-10: 5 [IU] via SUBCUTANEOUS
  Administered 2013-04-11: 2 [IU] via SUBCUTANEOUS
  Administered 2013-04-11: 3 [IU] via SUBCUTANEOUS
  Administered 2013-04-11: 5 [IU] via SUBCUTANEOUS
  Administered 2013-04-12 – 2013-04-13 (×5): 3 [IU] via SUBCUTANEOUS
  Administered 2013-04-14: 5 [IU] via SUBCUTANEOUS
  Administered 2013-04-14: 3 [IU] via SUBCUTANEOUS
  Administered 2013-04-15 – 2013-04-16 (×5): 2 [IU] via SUBCUTANEOUS
  Administered 2013-04-17: 3 [IU] via SUBCUTANEOUS
  Administered 2013-04-17 – 2013-04-19 (×2): 2 [IU] via SUBCUTANEOUS

## 2013-04-10 MED ORDER — INSULIN ASPART 100 UNIT/ML ~~LOC~~ SOLN: 0.0000 [IU] | Freq: Every day | SUBCUTANEOUS | Status: DC

## 2013-04-10 MED ORDER — FUROSEMIDE 10 MG/ML IJ SOLN
40.0000 mg | Freq: Two times a day (BID) | INTRAMUSCULAR | Status: DC
  Administered 2013-04-10 – 2013-04-11 (×3): 40 mg via INTRAVENOUS
  Filled 2013-04-10 (×4): qty 4

## 2013-04-10 NOTE — Progress Notes (Signed)
Family Medicine Social Visit Primary Team: Cardiothoracic surgery  S: 3 days post-op. Doing well off BiPap yesterday.  O: BP 117/81  Pulse 71  Temp(Src) 98.8 F (37.1 C) (Oral)  Resp 17  Ht 5\' 8"  (1.727 m)  Wt 229 lb 0.9 oz (103.9 kg)  BMI 34.84 kg/m2  SpO2 95%  GEN: NAD, lying in bed reading newspaper PULM: [place] in place at 5L, breathing comfortably  A/P: 63 y.o. male 3 days s/p CABG, stable. Cardiothoracic surgery managing through d/c, continuing diuresis. We appreciate their care of this patient.  [name], MD

## 2013-04-10 NOTE — Progress Notes (Addendum)
[address]       [place] [address]             [phone]        CARDIOTHORACIC SURGERY PROGRESS NOTE   R3 Days Post-Op Procedure(s) (LRB): CORONARY ARTERY BYPASS GRAFTING (CABG) (N/A) INTRAOPERATIVE TRANSESOPHAGEAL ECHOCARDIOGRAM (N/A)  Subjective: Feels okay.  Sore in chest.  Breathing improved.  Objective: Vital signs: BP Readings from Last 1 Encounters:  04/10/13 144/73   Pulse Readings from Last 1 Encounters:  04/10/13 71   Resp Readings from Last 1 Encounters:  04/10/13 19   Temp Readings from Last 1 Encounters:  04/10/13 98.8 F (37.1 C) Oral    Hemodynamics:    Physical Exam:  Rhythm:   sinus  Breath sounds: Fairly clear  Heart sounds:  RRR  Incisions:  Dressing dry, intact  Abdomen:  Soft, non-distended, non-tender  Extremities:  Warm, well-perfused    Intake/Output from previous day: 03/07 0701 - 03/08 0700 In: 468.4 [P.O.:120; I.V.:348.4] Out: 1030 [Urine:1030] Intake/Output this shift: Total I/O In: 280 [P.O.:240; I.V.:40] Out: 125 [Urine:125]  Lab Results:  CBC: Recent Labs  04/09/13 0430 04/10/13 0347  WBC 10.2 7.7  HGB 9.7* 9.5*  HCT 29.5* 27.9*  PLT 127* 100*    BMET:  Recent Labs  04/09/13 0430 04/10/13 0347  NA 139 137  K 4.5 3.8  CL 102 100  CO2 24 23  GLUCOSE 231* 150*  BUN 26* 35*  CREATININE 1.37* 1.23  CALCIUM 8.1* 8.0*     CBG (last 3)   Recent Labs  04/10/13 0042 04/10/13 0426 04/10/13 0625  GLUCAP 154* 148* 173*    ABG    Component Value Date/Time   PHART 7.406 04/08/2013 0356   PCO2ART 37.5 04/08/2013 0356   PO2ART 64.8* 04/08/2013 0356   HCO3 23.0 04/08/2013 0356   TCO2 19 04/08/2013 1645   ACIDBASEDEF 1.0 04/08/2013 0356   O2SAT 91.9 04/08/2013 0356    CXR: PORTABLE CHEST - 1 VIEW  COMPARISON: Chest x-ray 04/09/2013.  FINDINGS:  There is a bright-sided internal jugular central venous catheter  with tip terminating in the superior cavoatrial junction. Lung  volumes are  very low. There are bibasilar opacities favored to  predominantly reflect subsegmental atelectasis. There is  cephalization of the pulmonary vasculature and slight indistinctness  of the interstitial markings suggestive of mild pulmonary edema.  Mild cardiomegaly. Probable small bilateral pleural effusions (right  greater than left). Upper mediastinal contours are distorted by low  lung volumes and lordotic positioning. Status post median sternotomy  for CABG.  IMPRESSION:  1. Support apparatus, as above.  2. Mild cardiomegaly with evidence of mild interstitial pulmonary  edema, suggesting developing mild congestive heart failure.  3. Small bilateral pleural effusions with probable bibasilar  subsegmental atelectasis.  Electronically Signed  By: Vinnie Langton M.D.  On: 04/10/2013 09:07   Assessment/Plan: S/P Procedure(s) (LRB): CORONARY ARTERY BYPASS GRAFTING (CABG) (N/A) INTRAOPERATIVE TRANSESOPHAGEAL ECHOCARDIOGRAM (N/A)  Overall stable POD3 Maintaining NSR w/ stable BP off dopamine  Breathing and oxygenation improved Expected post op acute blood loss anemia, mild, stable  Expected post op volume excess  Expected post op atelectasis, mild although right hemidiaphragm somewhat elevated Acute on chronic renal insufficiency, likely due to acute kidney injury w/ pre-renal azotemia, mild, resolving w/ creatinine decreased further this am  Longstanding HTN  Type II diabetes mellitus, CBG's somewhat improved  Mobilize  Diuresis  Bronchodilators  Pulm toilet  Increase Levemir further Change  CBG's and SSI to AC/HS   OWEN,CLARENCE H 04/10/2013 9:55 AM

## 2013-04-10 NOTE — Progress Notes (Signed)
TCTS BRIEF SICU PROGRESS NOTE  3 Days Post-Op  S/P Procedure(s) (LRB): CORONARY ARTERY BYPASS GRAFTING (CABG) (N/A) INTRAOPERATIVE TRANSESOPHAGEAL ECHOCARDIOGRAM (N/A)   Stable day NSR w/ stable BP O2 sats 99% on 3 L/min via [place] Diuresing some  Plan: Continue current plan  Gardy Montanari H 04/10/2013 6:30 PM

## 2013-04-11 ENCOUNTER — Inpatient Hospital Stay (HOSPITAL_COMMUNITY): Payer: Medicaid Other

## 2013-04-11 LAB — CBC
HCT: 27.1 % — ABNORMAL LOW (ref 39.0–52.0)
HEMOGLOBIN: 8.8 g/dL — AB (ref 13.0–17.0)
MCH: 27.1 pg (ref 26.0–34.0)
MCHC: 32.5 g/dL (ref 30.0–36.0)
MCV: 83.4 fL (ref 78.0–100.0)
Platelets: 136 10*3/uL — ABNORMAL LOW (ref 150–400)
RBC: 3.25 MIL/uL — ABNORMAL LOW (ref 4.22–5.81)
RDW: 17.7 % — ABNORMAL HIGH (ref 11.5–15.5)
WBC: 7.5 10*3/uL (ref 4.0–10.5)

## 2013-04-11 LAB — BASIC METABOLIC PANEL
BUN: 35 mg/dL — AB (ref 6–23)
CALCIUM: 8.3 mg/dL — AB (ref 8.4–10.5)
CO2: 24 meq/L (ref 19–32)
CREATININE: 1.13 mg/dL (ref 0.50–1.35)
Chloride: 104 mEq/L (ref 96–112)
GFR calc Af Amer: 79 mL/min — ABNORMAL LOW (ref >90)
GFR calc non Af Amer: 68 mL/min — ABNORMAL LOW (ref >90)
GLUCOSE: 99 mg/dL (ref 70–99)
Potassium: 3.5 mEq/L — ABNORMAL LOW (ref 3.7–5.3)
Sodium: 140 mEq/L (ref 137–147)

## 2013-04-11 LAB — POCT I-STAT 3, ART BLOOD GAS (G3+)
Acid-base deficit: 3 mmol/L — ABNORMAL HIGH (ref 0.0–2.0)
BICARBONATE: 22.4 meq/L (ref 20.0–24.0)
O2 Saturation: 91 %
PH ART: 7.377 (ref 7.350–7.450)
Patient temperature: 98
TCO2: 24 mmol/L (ref 0–100)
pCO2 arterial: 38 mmHg (ref 35.0–45.0)
pO2, Arterial: 61 mmHg — ABNORMAL LOW (ref 80.0–100.0)

## 2013-04-11 LAB — GLUCOSE, CAPILLARY
GLUCOSE-CAPILLARY: 170 mg/dL — AB (ref 70–99)
Glucose-Capillary: 140 mg/dL — ABNORMAL HIGH (ref 70–99)
Glucose-Capillary: 219 mg/dL — ABNORMAL HIGH (ref 70–99)

## 2013-04-11 MED ORDER — CLONIDINE HCL 0.2 MG PO TABS
0.2000 mg | ORAL_TABLET | Freq: Two times a day (BID) | ORAL | Status: DC
  Administered 2013-04-11 – 2013-04-20 (×18): 0.2 mg via ORAL
  Filled 2013-04-11 (×19): qty 1

## 2013-04-11 MED ORDER — LEVALBUTEROL HCL 1.25 MG/0.5ML IN NEBU
1.2500 mg | INHALATION_SOLUTION | Freq: Three times a day (TID) | RESPIRATORY_TRACT | Status: DC
  Administered 2013-04-12 – 2013-04-15 (×9): 1.25 mg via RESPIRATORY_TRACT
  Filled 2013-04-11 (×14): qty 0.5

## 2013-04-11 MED ORDER — POTASSIUM CHLORIDE 10 MEQ/50ML IV SOLN
10.0000 meq | INTRAVENOUS | Status: DC | PRN
  Administered 2013-04-11 (×2): 10 meq via INTRAVENOUS
  Filled 2013-04-11: qty 50

## 2013-04-11 MED ORDER — LISINOPRIL 10 MG PO TABS
10.0000 mg | ORAL_TABLET | Freq: Every day | ORAL | Status: DC
  Administered 2013-04-11: 10 mg via ORAL
  Filled 2013-04-11 (×2): qty 1

## 2013-04-11 MED ORDER — FUROSEMIDE 10 MG/ML IJ SOLN
40.0000 mg | Freq: Every day | INTRAMUSCULAR | Status: DC
  Administered 2013-04-12 – 2013-04-13 (×2): 40 mg via INTRAVENOUS
  Filled 2013-04-11 (×3): qty 4

## 2013-04-11 NOTE — Progress Notes (Addendum)
TCTS DAILY ICU PROGRESS NOTE                   [place]            [place] [address]          [phone]   4 Days Post-Op Procedure(s) (LRB): CORONARY ARTERY BYPASS GRAFTING (CABG) (N/A) INTRAOPERATIVE TRANSESOPHAGEAL ECHOCARDIOGRAM (N/A)  Total Length of Stay:  LOS: 7 days   Subjective: conts to progress with recovery, nursing reports confusion at times  Objective: Vital signs in last 24 hours: Temp:  [98 F (36.7 C)-99 F (37.2 C)] 98.1 F (36.7 C) (03/09 0741) Pulse Rate:  [59-71] 70 (03/09 0700) Cardiac Rhythm:  [-] Normal sinus rhythm (03/09 0600) Resp:  [11-25] 15 (03/09 0700) BP: (96-163)/(55-81) 143/66 mmHg (03/09 0700) SpO2:  [92 %-99 %] 96 % (03/09 0700) Weight:  [228 lb 13.4 oz (103.8 kg)] 228 lb 13.4 oz (103.8 kg) (03/09 0400)  Filed Weights   04/09/13 0500 04/10/13 0606 04/11/13 0400  Weight: 233 lb 8 oz (105.915 kg) 229 lb 0.9 oz (103.9 kg) 228 lb 13.4 oz (103.8 kg)    Weight change: -3.5 oz (-0.1 kg)   Hemodynamic parameters for last 24 hours:    Intake/Output from previous day: 03/08 0701 - 03/09 0700 In: 900 [P.O.:440; I.V.:460] Out: 2645 [Urine:2645]  Intake/Output this shift:    Current Meds: Scheduled Meds: . acetaminophen  1,000 mg Oral 4 times per day  . aspirin EC  325 mg Oral Daily  . bisacodyl  10 mg Oral Daily   Or  . bisacodyl  10 mg Rectal Daily  . budesonide-formoterol  2 puff Inhalation BID  . cloNIDine  0.1 mg Oral BID  . docusate sodium  200 mg Oral Daily  . furosemide  40 mg Intravenous BID  . insulin aspart  0-15 Units Subcutaneous TID WC  . insulin aspart  0-5 Units Subcutaneous QHS  . insulin aspart  3 Units Subcutaneous TID WC  . insulin detemir  28 Units Subcutaneous BID  . levalbuterol  1.25 mg Nebulization Q4H  . metoprolol tartrate  25 mg Oral BID  . pantoprazole  40 mg Oral Daily  . simvastatin  20 mg Oral QHS  . sodium chloride  3 mL Intravenous Q12H   Continuous Infusions: . sodium  chloride 250 mL (04/10/13 0700)   PRN Meds:.metoprolol, morphine injection, ondansetron (ZOFRAN) IV, oxyCODONE, potassium chloride, sodium chloride, traMADol  General appearance: alert, cooperative and no distress Heart: regular rate and rhythm Lungs: dim in bases Abdomen: soft, non-tender Extremities: + LE edema Wound: incis healing well  Lab Results: CBC: Recent Labs  04/10/13 0347 04/11/13 0426  WBC 7.7 7.5  HGB 9.5* 8.8*  HCT 27.9* 27.1*  PLT 100* 136*   BMET:  Recent Labs  04/10/13 0347 04/11/13 0426  NA 137 140  K 3.8 3.5*  CL 100 104  CO2 23 24  GLUCOSE 150* 99  BUN 35* 35*  CREATININE 1.23 1.13  CALCIUM 8.0* 8.3*    PT/INR: No results found for this basename: LABPROT, INR,  in the last 72 hours Radiology: Dg Chest Port 1 View  04/10/2013   CLINICAL DATA:  Atelectasis.  EXAM: PORTABLE CHEST - 1 VIEW  COMPARISON:  Chest x-ray 04/09/2013.  FINDINGS: There is a bright-sided internal jugular central venous catheter with tip terminating in the superior cavoatrial junction. Lung volumes are very low. There are bibasilar opacities favored to predominantly reflect subsegmental atelectasis. There  is cephalization of the pulmonary vasculature and slight indistinctness of the interstitial markings suggestive of mild pulmonary edema. Mild cardiomegaly. Probable small bilateral pleural effusions (right greater than left). Upper mediastinal contours are distorted by low lung volumes and lordotic positioning. Status post median sternotomy for CABG.  IMPRESSION: 1. Support apparatus, as above. 2. Mild cardiomegaly with evidence of mild interstitial pulmonary edema, suggesting developing mild congestive heart failure. 3. Small bilateral pleural effusions with probable bibasilar subsegmental atelectasis.   Electronically Signed   By: Vinnie Langton M.D.   On: 04/10/2013 09:07     Assessment/Plan: S/P Procedure(s) (LRB): CORONARY ARTERY BYPASS GRAFTING (CABG) (N/A) INTRAOPERATIVE  TRANSESOPHAGEAL ECHOCARDIOGRAM (N/A)  1 conts to make good overall progress 2 pulm status improving- on 3 liters, CXR pending. Push pulm toilet/ 3 in NSR 4 anemia stable 5 receiving K+ replacement 6 push cardiac rehab as able 7 cont diuresis, reduve to q day , start lisinopril 10 q day 9 sugars fairly well controlled- ? Transition to home meds? 10 transfer to [place] 04/11/2013 8:00 AM  Status improving but still borderline because of underlying severe COPD Maintaining sinus rhythm and relating hallway Agree with plans to transfer to step down

## 2013-04-12 ENCOUNTER — Inpatient Hospital Stay (HOSPITAL_COMMUNITY): Payer: Medicaid Other

## 2013-04-12 DIAGNOSIS — Z951 Presence of aortocoronary bypass graft: Secondary | ICD-10-CM

## 2013-04-12 LAB — BASIC METABOLIC PANEL
BUN: 26 mg/dL — ABNORMAL HIGH (ref 6–23)
CO2: 25 mEq/L (ref 19–32)
Calcium: 8.3 mg/dL — ABNORMAL LOW (ref 8.4–10.5)
Chloride: 105 mEq/L (ref 96–112)
Creatinine, Ser: 0.91 mg/dL (ref 0.50–1.35)
GFR calc Af Amer: >90 mL/min (ref >90)
GFR calc non Af Amer: 90 mL/min — ABNORMAL LOW (ref >90)
Glucose, Bld: 231 mg/dL — ABNORMAL HIGH (ref 70–99)
Potassium: 4 mEq/L (ref 3.7–5.3)
Sodium: 142 mEq/L (ref 137–147)

## 2013-04-12 LAB — CBC
HCT: 27.4 % — ABNORMAL LOW (ref 39.0–52.0)
Hemoglobin: 8.7 g/dL — ABNORMAL LOW (ref 13.0–17.0)
MCH: 26.5 pg (ref 26.0–34.0)
MCHC: 31.8 g/dL (ref 30.0–36.0)
MCV: 83.5 fL (ref 78.0–100.0)
Platelets: 154 10*3/uL (ref 150–400)
RBC: 3.28 MIL/uL — ABNORMAL LOW (ref 4.22–5.81)
RDW: 17.3 % — ABNORMAL HIGH (ref 11.5–15.5)
WBC: 5.3 10*3/uL (ref 4.0–10.5)

## 2013-04-12 LAB — GLUCOSE, CAPILLARY
GLUCOSE-CAPILLARY: 118 mg/dL — AB (ref 70–99)
Glucose-Capillary: 159 mg/dL — ABNORMAL HIGH (ref 70–99)
Glucose-Capillary: 186 mg/dL — ABNORMAL HIGH (ref 70–99)
Glucose-Capillary: 159 mg/dL — ABNORMAL HIGH (ref 70–99)
Glucose-Capillary: 184 mg/dL — ABNORMAL HIGH (ref 70–99)

## 2013-04-12 MED ORDER — LISINOPRIL 20 MG PO TABS
20.0000 mg | ORAL_TABLET | Freq: Every day | ORAL | Status: DC
  Administered 2013-04-12: 20 mg via ORAL
  Filled 2013-04-12 (×2): qty 1

## 2013-04-12 MED ORDER — LACTULOSE 10 GM/15ML PO SOLN
30.0000 g | Freq: Every day | ORAL | Status: DC
  Administered 2013-04-12 – 2013-04-19 (×5): 30 g via ORAL
  Filled 2013-04-12 (×9): qty 45

## 2013-04-12 MED ORDER — NICOTINE 14 MG/24HR TD PT24
14.0000 mg | MEDICATED_PATCH | Freq: Every day | TRANSDERMAL | Status: DC
Start: 2013-04-12 — End: 2013-04-20
  Administered 2013-04-12 – 2013-04-20 (×9): 14 mg via TRANSDERMAL
  Filled 2013-04-12 (×9): qty 1

## 2013-04-12 MED ORDER — GLIPIZIDE 10 MG PO TABS
10.0000 mg | ORAL_TABLET | Freq: Two times a day (BID) | ORAL | Status: DC
  Administered 2013-04-12 – 2013-04-20 (×16): 10 mg via ORAL
  Filled 2013-04-12 (×19): qty 1

## 2013-04-12 MED ORDER — AMLODIPINE BESYLATE 10 MG PO TABS
10.0000 mg | ORAL_TABLET | Freq: Every day | ORAL | Status: DC
  Administered 2013-04-12 – 2013-04-20 (×9): 10 mg via ORAL
  Filled 2013-04-12 (×9): qty 1

## 2013-04-12 MED ORDER — INSULIN DETEMIR 100 UNIT/ML ~~LOC~~ SOLN
30.0000 [IU] | Freq: Two times a day (BID) | SUBCUTANEOUS | Status: DC
  Administered 2013-04-12 – 2013-04-20 (×17): 30 [IU] via SUBCUTANEOUS
  Filled 2013-04-12 (×18): qty 0.3

## 2013-04-12 NOTE — Progress Notes (Signed)
Clinical Social Work Department CLINICAL SOCIAL WORK PLACEMENT NOTE 04/12/2013  Patient:  LLOYD, TOLLY  Account Number:  [id number] Admit date:  04/04/2013  Clinical Social Worker:  Megan Salon  Date/time:  04/12/2013 01:38 PM  Clinical Social Work is seeking post-discharge placement for this patient at the following level of care:   [place]   (*CSW will update this form in Epic as items are completed)   04/12/2013  Patient/family provided with [facility] Department of Clinical Social Work's list of facilities offering this level of care within the geographic area requested by the patient (or if unable, by the patient's family).  04/12/2013  Patient/family informed of their freedom to choose among providers that offer the needed level of care, that participate in Medicare, Medicaid or managed care program needed by the patient, have an available bed and are willing to accept the patient.  04/12/2013  Patient/family informed of [facility]' ownership interest in [facility], as well as of the fact that they are under no obligation to receive care at this facility.  PASARR submitted to EDS on 04/12/2013 PASARR number received from EDS on 04/12/2013  FL2 transmitted to all facilities in geographic area requested by pt/family on  04/12/2013 FL2 transmitted to all facilities within larger geographic area on   Patient informed that his/her managed care company has contracts with or will negotiate with  certain facilities, including the following:     Patient/family informed of bed offers received:   Patient chooses bed at  Physician recommends and patient chooses bed at    Patient to be transferred to  on   Patient to be transferred to facility by   The following physician request were entered in Epic:   Additional Comments:  Jeanette Caprice, MSW, [place]

## 2013-04-12 NOTE — Progress Notes (Signed)
Clinical Social Work Department BRIEF PSYCHOSOCIAL ASSESSMENT 04/12/2013  Patient:  Leonard [name], Leonard [name]     Account Number:  [id number]     Admit date:  04/04/2013  Clinical Social Worker:  Megan Salon  Date/Time:  04/12/2013 01:33 PM  Referred by:  Physician  Date Referred:  04/12/2013 Referred for  SNF Placement   Other Referral:   Interview type:  Patient Other interview type:    PSYCHOSOCIAL DATA Living Status:  FAMILY Admitted from facility:   Level of care:   Primary support name:  Makhari Postiglione Primary support relationship to patient:  SIBLING Degree of support available:   Good    CURRENT CONCERNS Current Concerns  Post-Acute Placement   Other Concerns:    SOCIAL WORK ASSESSMENT / PLAN CSW received referral that patient needs SNF placement. CSW reviewed chart and went to speak to patient about snf placement. Patient states he lives with his sister and she stays home with him and does not understand why he would need snf placement. CSW explained that it may be because patient is unsteady, per RN. Patient states he is agreeable to be faxed out, but may go home with home health if his sister is able to provide care. CSW to follow   Assessment/plan status:  Psychosocial Support/Ongoing Assessment of Needs Other assessment/ plan:   Information/referral to community resources:   SNF information    PATIENT'S/FAMILY'S RESPONSE TO PLAN OF CARE: Patient states he lives with his sister and she would be able to provide supervision, but would go to SNF if she is not able too and PT thinks snf is best.        Jeanette Caprice, MSW, [place]

## 2013-04-12 NOTE — Progress Notes (Signed)
Ambulated in hallway with patient this morning using front wheel walker and 1 L of O2. Patient SATS were 85% on 1 L.  Placed patient on 3 L of O2 SATS increased to 90%. Patient ambulated approx. 250 feet, did become SOB. Patient also has unsteady gait. Patient tends to "wobble" side to side when walked. Patient also swayed side to side while standing still. Elmarie Shiley R

## 2013-04-12 NOTE — Progress Notes (Addendum)
       [address]       [place] [address]             [phone]          5 Days Post-Op Procedure(s) (LRB): CORONARY ARTERY BYPASS GRAFTING (CABG) (N/A) INTRAOPERATIVE TRANSESOPHAGEAL ECHOCARDIOGRAM (N/A)  Subjective: Just back from walking.  Desats with ambulation, still requires 2-3 liters O2.  Eating well, no other complaints.   Objective: Vital signs in last 24 hours: Patient Vitals for the past 24 hrs:  BP Temp Temp src Pulse Resp SpO2 Weight  04/12/13 0741 - - - - - 95 % -  04/12/13 0618 160/66 mmHg 99.5 F (37.5 C) Oral 74 18 95 % 229 lb (103.874 kg)  04/12/13 0119 145/78 mmHg - - - - - -  04/11/13 2132 165/78 mmHg - - 71 - - -  04/11/13 2030 154/66 mmHg 98.3 F (36.8 C) Oral 70 18 94 % -  04/11/13 1933 - - - 70 18 95 % -  04/11/13 1704 - - - 69 18 90 % -  04/11/13 1600 144/71 mmHg 97.9 F (36.6 C) Oral 68 16 94 % -  04/11/13 1500 134/69 mmHg - - 66 16 93 % -  04/11/13 1400 140/71 mmHg - - 69 23 94 % -  04/11/13 1300 149/78 mmHg - - 71 20 92 % -  04/11/13 1200 155/78 mmHg - - 70 24 94 % -  04/11/13 1128 - 98.7 F (37.1 C) Oral - - - -  04/11/13 1100 134/68 mmHg - - 72 13 94 % -  04/11/13 1000 147/72 mmHg - - 71 21 96 % -  04/11/13 0900 157/70 mmHg - - 71 21 95 % -   Current Weight  04/12/13 229 lb (103.874 kg)  PRE-OPERATIVE WEIGHT: 97.8kg    Intake/Output from previous day: 03/09 0701 - 03/10 0700 In: 760 [P.O.:600; I.V.:60; IV Piggyback:100] Out: 2275 [Urine:2275]  CBGs [phone]    PHYSICAL EXAM:  Heart: RRR Lungs: Coarse BS bilaterally, few crackles in bases Wound: Clean and dry Extremities: +LE edema, L>R    Lab Results: CBC: Recent Labs  04/11/13 0426 04/12/13 0342  WBC 7.5 5.3  HGB 8.8* 8.7*  HCT 27.1* 27.4*  PLT 136* 154   BMET:  Recent Labs  04/11/13 0426 04/12/13 0342  NA 140 142  K 3.5* 4.0  CL 104 105  CO2 24 25  GLUCOSE 99 231*  BUN 35* 26*  CREATININE 1.13 0.91  CALCIUM 8.3*  8.3*    PT/INR: No results found for this basename: LABPROT, INR,  in the last 72 hours    Assessment/Plan: S/P Procedure(s) (LRB): CORONARY ARTERY BYPASS GRAFTING (CABG) (N/A) INTRAOPERATIVE TRANSESOPHAGEAL ECHOCARDIOGRAM (N/A)  CV- BPs trending up, maintaining SR. Continue Lopressor and Clonidine, will increase ACE-I and resume Norvasc.  Pulm- continue IS, will add flutter vlave, Mucinex.  Wean O2 as tolerated.  Vol overload- continue IV Lasix - UOP excellent yesterday.  DM- A1C=8.6 Will increase Levemir to home dose, restart Glucotrol.   A/CRI- Cr stable at present.  Will watch as we increase ACE-I.  Expected postop blood loss anemia- H/H stable.  CRPI.   LOS: 8 days    COLLINS,GINA H 04/12/2013  Agree with above assessment and plan He may need O2 at DC He lives alone and will need SNF  patient examined and medical record reviewed,agree with above note. [name],PETER 04/12/2013

## 2013-04-12 NOTE — Evaluation (Signed)
Physical Therapy Evaluation Patient Details Name: Leonard [name] MRN: [id number] DOB: December 08, 1950 Today's Date: 04/12/2013 Time: [id number] PT Time Calculation (min): 25 min  PT Assessment / Plan / Recommendation History of Present Illness  pt presented with acute pulmonary edema and volume overload, who is now s/p CABG.    Clinical Impression  Pt unsteady and with decreased cognition as compared to previous PT eval on 3/2.  At this point pt is not safe to return to home and would benefit from CIR to maximize independence prior to returning to home.      PT Assessment  Patient needs continued PT services    Follow Up Recommendations  CIR    Does the patient have the potential to tolerate intense rehabilitation      Barriers to Discharge        Equipment Recommendations  Rolling walker with 5" wheels;3in1 (PT)    Recommendations for Other Services OT consult;Rehab consult   Frequency Min 3X/week    Precautions / Restrictions Precautions Precautions: Sternal Restrictions Weight Bearing Restrictions: No   Pertinent Vitals/Pain Denied pain.        Mobility  Bed Mobility Overal bed mobility: Needs Assistance Bed Mobility: Supine to Sit Supine to sit: Mod assist General bed mobility comments: cues for sternal precautions, however pt tries to use momentum by rolling and swinging LEs OOB.   Transfers Overall transfer level: Needs assistance Equipment used: Rolling walker (2 wheeled) Transfers: Sit to/from Stand Sit to Stand: Min assist General transfer comment: cues for UE use and getting closer to bed prior to sitting.   Ambulation/Gait Ambulation/Gait assistance: Min assist Ambulation Distance (Feet): 150 Feet Assistive device: Rolling walker (2 wheeled) Gait Pattern/deviations: Step-through pattern;Decreased stride length;Drifts right/left General Gait Details: pt a little impulsive and with Bil lateral sway.  pt needs A to prevent running into objects as pt having  difficulty problem solving negotiating around obstacles.      Exercises     PT Diagnosis: Difficulty walking  PT Problem List: Decreased activity tolerance;Decreased balance;Decreased mobility;Decreased coordination;Decreased cognition;Decreased knowledge of use of DME;Decreased safety awareness;Decreased knowledge of precautions;Cardiopulmonary status limiting activity PT Treatment Interventions: DME instruction;Gait training;Functional mobility training;Therapeutic activities;Therapeutic exercise;Balance training;Cognitive remediation;Patient/family education     PT Goals(Current goals can be found in the care plan section) Acute Rehab PT Goals Patient Stated Goal: to go home PT Goal Formulation: With patient Time For Goal Achievement: 04/26/13 Potential to Achieve Goals: Good  Visit Information  Last PT Received On: 04/12/13 Assistance Needed: +1 History of Present Illness: pt presented with acute pulmonary edema and volume overload, who is now s/p CABG.         Prior [place] expects to be discharged to:: Inpatient rehab Living Arrangements: Other relatives Available Help at Discharge: Family;Available 24 hours/day Type of Home: Apartment Home Access: Level entry Home Layout: One level Home Equipment: None Prior Function Level of Independence: Independent Communication Communication: No difficulties Dominant Hand: Right    Cognition  Cognition Arousal/Alertness:  (Drowsy, but arousable.  ) Behavior During Therapy: WFL for tasks assessed/performed Overall Cognitive Status: Impaired/Different from baseline Area of Impairment: Attention;Memory;Following commands;Safety/judgement;Awareness;Problem solving;Orientation Orientation Level: Disoriented to;Time Current Attention Level: Sustained Memory: Decreased short-term memory;Decreased recall of precautions Following Commands: Follows one step commands inconsistently Safety/Judgement:  Decreased awareness of safety;Decreased awareness of deficits Awareness: Intellectual;Emergent;Anticipatory Problem Solving: Slow processing;Decreased initiation;Difficulty sequencing;Requires verbal cues;Requires tactile cues General Comments: No family present to confirm baseline cognition, however on previous PT eval on 3/2  pt did not have any cognitive deficits.      Extremity/Trunk Assessment Upper Extremity Assessment Upper Extremity Assessment: Defer to OT evaluation Lower Extremity Assessment Lower Extremity Assessment: Generalized weakness Cervical / Trunk Assessment Cervical / Trunk Assessment: Normal   Balance Balance Overall balance assessment: Needs assistance Standing balance support: Bilateral upper extremity supported Standing balance-Leahy Scale: Poor  End of Session PT - End of Session Equipment Utilized During Treatment: Gait belt;Oxygen Activity Tolerance: Patient tolerated treatment well Patient left: in bed;with call bell/phone within reach Nurse Communication: Mobility status  GP     Catarina Hartshorn, [place] 04/12/2013, 3:25 PM

## 2013-04-12 NOTE — Progress Notes (Signed)
Rehab Admissions Coordinator Note:  Patient was screened by Cleatrice Burke for appropriateness for an Inpatient Acute Rehab Consult per PT recommendations.  At this time, we are recommending Inpatient Rehab consult if you feel appropriate.  Cleatrice Burke 04/12/2013, 7:20 PM  I can be reached at [phone].

## 2013-04-12 NOTE — Progress Notes (Signed)
Pt ambulated around the unit x1 assist on 3L of oxygen. Pt was unsteady at times, may benefit from using a walker. Pt had no complaints of pain, will continue to monitor.

## 2013-04-13 ENCOUNTER — Inpatient Hospital Stay (HOSPITAL_COMMUNITY): Payer: Medicaid Other

## 2013-04-13 DIAGNOSIS — R5381 Other malaise: Secondary | ICD-10-CM

## 2013-04-13 DIAGNOSIS — I2510 Atherosclerotic heart disease of native coronary artery without angina pectoris: Secondary | ICD-10-CM

## 2013-04-13 LAB — BASIC METABOLIC PANEL
BUN: 18 mg/dL (ref 6–23)
CO2: 27 mEq/L (ref 19–32)
Calcium: 8.7 mg/dL (ref 8.4–10.5)
Chloride: 104 mEq/L (ref 96–112)
Creatinine, Ser: 0.85 mg/dL (ref 0.50–1.35)
GFR calc Af Amer: >90 mL/min (ref >90)
GFR calc non Af Amer: >90 mL/min (ref >90)
Glucose, Bld: 190 mg/dL — ABNORMAL HIGH (ref 70–99)
Potassium: 3.8 mEq/L (ref 3.7–5.3)
Sodium: 143 mEq/L (ref 137–147)

## 2013-04-13 LAB — GLUCOSE, CAPILLARY
GLUCOSE-CAPILLARY: 202 mg/dL — AB (ref 70–99)
GLUCOSE-CAPILLARY: 183 mg/dL — AB (ref 70–99)
GLUCOSE-CAPILLARY: 136 mg/dL — AB (ref 70–99)
Glucose-Capillary: 182 mg/dL — ABNORMAL HIGH (ref 70–99)

## 2013-04-13 MED ORDER — METFORMIN HCL 500 MG PO TABS
1000.0000 mg | ORAL_TABLET | Freq: Two times a day (BID) | ORAL | Status: DC
  Administered 2013-04-13 – 2013-04-20 (×13): 1000 mg via ORAL
  Filled 2013-04-13 (×16): qty 2

## 2013-04-13 MED ORDER — POTASSIUM CHLORIDE 10 MEQ/50ML IV SOLN
10.0000 meq | INTRAVENOUS | Status: AC
  Administered 2013-04-13 (×2): 10 meq via INTRAVENOUS
  Filled 2013-04-13 (×2): qty 50

## 2013-04-13 MED ORDER — SODIUM CHLORIDE 0.9 % IJ SOLN
10.0000 mL | INTRAMUSCULAR | Status: DC | PRN
  Administered 2013-04-13: 20 mL
  Administered 2013-04-14: 10 mL

## 2013-04-13 MED ORDER — LISINOPRIL 40 MG PO TABS
40.0000 mg | ORAL_TABLET | Freq: Every day | ORAL | Status: DC
  Administered 2013-04-13 – 2013-04-20 (×8): 40 mg via ORAL
  Filled 2013-04-13 (×8): qty 1

## 2013-04-13 MED ORDER — METOLAZONE 5 MG PO TABS
5.0000 mg | ORAL_TABLET | Freq: Every day | ORAL | Status: AC
  Administered 2013-04-13: 5 mg via ORAL
  Filled 2013-04-13: qty 1

## 2013-04-13 NOTE — Progress Notes (Signed)
Pt ambulated 250 ft with front wheel walker on 3 Liters of oxygen x1 assist. Pt stable and tolerated ambulation well. Will continue to monitor.

## 2013-04-13 NOTE — Progress Notes (Signed)
Mr. Hoerl is doing well post-CABG. BP still running high, agree with plan to increase ACE-I. Plan for CIR stay. No further suggestions.  Pixie Casino, MD, [facility] Attending Cardiologist [place]

## 2013-04-13 NOTE — Progress Notes (Signed)
Rehab admissions - I met with pt but pt was very sleepy during our discussion. Informational brochures were left and noted Dr. Charm Barges recommendation for acute rehab is dependent on his activity tolerance/cognitive status tomorrow. MD feels pt may progress to the point of returning home with sister's support and home health services. However, if pt does not demonstrate improvement, he may benefit from brief CIR stay.  Will check on pt's progress tomorrow and proceed accordingly. Please call me with any questions. Thanks.  Nanetta Batty, PT Rehabilitation Admissions Coordinator [phone]

## 2013-04-13 NOTE — Progress Notes (Addendum)
       [address]       [place] [address]             [phone]          6 Days Post-Op Procedure(s) (LRB): CORONARY ARTERY BYPASS GRAFTING (CABG) (N/A) INTRAOPERATIVE TRANSESOPHAGEAL ECHOCARDIOGRAM (N/A)  Subjective: Feels better today, had a BM.  Breathing stable.   Objective: Vital signs in last 24 hours: Patient Vitals for the past 24 hrs:  BP Temp Temp src Pulse Resp SpO2 Weight  04/13/13 0413 160/70 mmHg 98 F (36.7 C) Oral 73 18 95 % 223 lb 8.7 oz (101.4 kg)  04/12/13 2112 160/73 mmHg - - 74 - - -  04/12/13 2016 174/84 mmHg - - 70 18 98 % -  04/12/13 1930 - 97.6 F (36.4 C) Oral 73 18 98 % -  04/12/13 1400 163/75 mmHg 98.8 F (37.1 C) Oral 70 20 92 % -  04/12/13 0947 157/69 mmHg - - - - - -  04/12/13 0946 157/69 mmHg - - 74 - - -   Current Weight  04/13/13 223 lb 8.7 oz (101.4 kg)  PRE-OPERATIVE WEIGHT: 97.8kg    Intake/Output from previous day: 03/10 0701 - 03/11 0700 In: -  Out: 1850 [Urine:1850]  CBGs [phone]    PHYSICAL EXAM:  Heart: RRR Lungs: Clearer today, still few coarse BS bilaterally Wound: Clean and dry Extremities:Mild LE edema    Lab Results: CBC: Recent Labs  04/11/13 0426 04/12/13 0342  WBC 7.5 5.3  HGB 8.8* 8.7*  HCT 27.1* 27.4*  PLT 136* 154   BMET:  Recent Labs  04/12/13 0342 04/13/13 0500  NA 142 143  K 4.0 3.8  CL 105 104  CO2 25 27  GLUCOSE 231* 190*  BUN 26* 18  CREATININE 0.91 0.85  CALCIUM 8.3* 8.7    PT/INR: No results found for this basename: LABPROT, INR,  in the last 72 hours    Assessment/Plan: S/P Procedure(s) (LRB): CORONARY ARTERY BYPASS GRAFTING (CABG) (N/A) INTRAOPERATIVE TRANSESOPHAGEAL ECHOCARDIOGRAM (N/A)  CV- SR, BPs remain elevated. Continue Lopressor, Norvasc and Clonidine, will increase ACE-I further.   Pulm- continue IS/FV, Mucinex. Wean O2 as tolerated.   Vol overload- continue IV Lasix- appears less edematous and wt trending down. DM-  A1C=8.6 Sugars remain elevated.  Will continue Levemir, Glucotrol, resume Metformin.  A/CRI- Cr stable at present. Watch closely with restart of metformin.  Expected postop blood loss anemia- H/H stable.   Continue PT, CRPI.  Disp- pt is a candidate for CIR.  Hopefully will be ready for transfer soon.    LOS: 9 days    COLLINS,GINA H 04/13/2013 patient examined and medical record reviewed,agree with above note. [name],[place] 04/14/2013

## 2013-04-13 NOTE — Progress Notes (Signed)
EPW removed per order. No bleeding noted, no resistance met. Frequent vital set up, bed rest per protocol. Elmarie Shiley R

## 2013-04-13 NOTE — Progress Notes (Signed)
At this time, there are no SNF offers for patient close to [facility] due to [employer]. CSW continuing to search as backup plan to CIR vs HH.  Jeanette Caprice, MSW, [place]

## 2013-04-13 NOTE — Discharge Summary (Signed)
[address]       [place] [address]             [phone]              Discharge Summary  Name: Leonard [name] DOB: February 04, 1951 63 y.o. MRN: [id number]   Admission Date: 04/04/2013 Discharge Date: 04/20/2013    Admitting Diagnosis: Shortness of breath   Discharge Diagnosis:  Acute diastolic congestive heart failure Acute pulmonary edema Non-ST elevation myocardial infarction Severe three vessel coronary artery disease Expected postoperative blood loss anemia Right pleural effusion   Past Medical History  Diagnosis Date  . Hypertension   . PAD (peripheral artery disease)   . Gangrene of toe     right 5th/notes 01/04/2013   . High cholesterol   . Type II diabetes mellitus       Procedures: CORONARY ARTERY BYPASS GRAFTING x 3 (Left internal mammary artery to left anterior descending, saphenous vein graft to obtuse marginal, saphenous vein graft to posterior descending) ENDOSCOPIC VEIN HARVEST RIGHT LEG - 04/07/2013    HPI:  The patient is a 63 y.o. male who presented to the ER on the date of this admission complaining of shortness of breath which was of sudden onset around 4 am.  There was no associated chest pain, cough, fever or chills.  He did note increased lower extremity edema over the preceding weeks.  Chest x-ray showed acute pulmonary edema.  Initial EKG was unremarkable.  He was treated with IV Lasix and was admitted for further evaluation.    Hospital Course:  The patient was admitted to [facility] on 04/04/2013. Cardiology was consulted and a 2D echo was performed which showed EF 55-90% with moderate LVH and no valvular dysfunction.  He was started on a beta blocker and ACE-I and diuresis was continued. Cardiac enzymes were initially negative, but troponins ultimately returned positive. Cardiac catheterization was recommended, and this was performed on 04/05/2013 by Dr. Martinique.  This revealed severe multivessel coronary artery  disease with normal LV function. He was not felt to be a good candidate for percutaneous intervention, and a cardiac surgery consult was requested.  Dr. Prescott Gum saw the patient and reviewed his films and felt he would benefit from surgical revascularization.  All risks, benefits and alternatives of surgery were explained in detail, and the patient agreed to proceed. The patient was taken to the operating room and underwent the above procedure.    The postoperative course has been notable for volume overload for which the patient was treated with aggressive diuresis. He also had a persistent right pleural effusion which did not respond to diuresis and ultimately required ultrasound guided thoracentesis.  This was performed in radiology on 3/16 and yielded 1 liter of serosanguinous fluid. He has been treated with aggressive pulmonary toilet measures and bronchodilator therapy and has been weaned from supplemental oxygen. He has been hypertensive and all home medications have been resumed with improvement in his blood pressures. Diabetes has been controlled with home diabetes medications (preop A1C=8.6).    He is overall progressing well.  He is ambulating with PT and cardiac rehab and doing well with mobility. He has a chronic anemia which has been stable and has not required transfusion. Incisions are healing well.  He is tolerating a regular diet. The patient does live alone, and does not have 24 hour assistance, therefore, the social worker has obtained skilled nursing facility placement.  The patient is clinically  stable for discharge on today's date once all arrangements are complete.     Recent vital signs:  Filed Vitals:   04/13/13 0413  BP: 160/70  Pulse: 73  Temp: 98 F (36.7 C)  Resp: 18    Recent laboratory studies:  CBC: Recent Labs  04/11/13 0426 04/12/13 0342  WBC 7.5 5.3  HGB 8.8* 8.7*  HCT 27.1* 27.4*  PLT 136* 154   BMET:  Recent Labs  04/12/13 0342 04/13/13 0500    NA 142 143  K 4.0 3.8  CL 105 104  CO2 25 27  GLUCOSE 231* 190*  BUN 26* 18  CREATININE 0.91 0.85  CALCIUM 8.3* 8.7    PT/INR: No results found for this basename: LABPROT, INR,  in the last 72 hours   Discharge Medications:     Medication List    STOP taking these medications       atenolol 100 MG tablet  Commonly known as:  TENORMIN     hydrochlorothiazide 12.5 MG capsule  Commonly known as:  MICROZIDE      TAKE these medications       ACCU-CHEK AVIVA device  Use as instructed     accu-chek soft touch lancets  Use as instructed     accu-chek softclix lancets  Use as instructed to test 4 times a day.  Dx code 250.00.  May change to fit patients meter     amLODipine 10 MG tablet  Commonly known as:  NORVASC  Take 1 tablet (10 mg total) by mouth daily.     aspirin 325 MG EC tablet  Take 1 tablet (325 mg total) by mouth daily.     budesonide-formoterol 160-4.5 MCG/ACT inhaler  Commonly known as:  SYMBICORT  Inhale 2 puffs into the lungs 2 (two) times daily.     cloNIDine 0.2 MG tablet  Commonly known as:  CATAPRES  Take 1 tablet (0.2 mg total) by mouth 2 (two) times daily.     furosemide 40 MG tablet  Commonly known as:  LASIX  Take 1 tablet (40 mg total) by mouth daily.     glipiZIDE 10 MG tablet  Commonly known as:  GLUCOTROL  Take 1 tablet (10 mg total) by mouth 2 (two) times daily before a meal.     glucose blood test strip  Commonly known as:  ACCU-CHEK AVIVA  Use as instructed     insulin glargine 100 units/mL Soln  Commonly known as:  LANTUS  Inject 30 Units into the skin 2 (two) times daily.     lisinopril 40 MG tablet  Commonly known as:  PRINIVIL,ZESTRIL  Take 1 tablet (40 mg total) by mouth daily.     metFORMIN 1000 MG tablet  Commonly known as:  GLUCOPHAGE  Take 1 tablet (1,000 mg total) by mouth 2 (two) times daily with a meal.     metoprolol tartrate 25 MG tablet  Commonly known as:  LOPRESSOR  Take 1 tablet (25 mg total) by  mouth 2 (two) times daily.     nicotine 7 mg/24hr patch  Commonly known as:  NICODERM CQ - dosed in mg/24 hr  Place 1 patch (7 mg total) onto the skin daily.     oxyCODONE 5 MG immediate release tablet  Commonly known as:  Oxy IR/ROXICODONE  Take 1-2 tablets (5-10 mg total) by mouth every 3 (three) hours as needed for moderate pain.     potassium chloride SA 20 MEQ tablet  Commonly known as:  K-DUR,KLOR-CON  Take 1 tablet (20 mEq total) by mouth daily.     pravastatin 40 MG tablet  Commonly known as:  PRAVACHOL  Take 1 tablet (40 mg total) by mouth daily.         Discharge Instructions:    The patient is to refrain from driving, heavy lifting or strenuous activity.    May shower daily and clean incisions with soap and water.    May resume carb modified diet.  Please obtain vital signs at least one time daily.  Please weigh the patient daily. If he or she continues to gain weight or develops lower extremity edema, contact the office at [phone].  Ambulate patient at least three times daily and please use sternal precautions.     Follow Up:       Future Appointments Provider Department Dept Phone   04/14/2013 9:00 AM Leavy Cella, [place] [phone]   04/21/2013 3:00 PM Michel Bickers, MD [facility] [phone]     Follow-up Information   Follow up with VAN Wilber Oliphant, MD On 05/11/2013. (Have a chest x-ray at [facility] at 11:45 , then see MD at 12:45)    Specialty:  Cardiothoracic Surgery   Contact information:   [place] [address] [phone]       Follow up with Peter Martinique, MD In 2 weeks. (office will contact you with an appointment)    Specialty:  Cardiology   Contact information:   [address] [place] [address] [phone]         Jacobs Golab H 04/13/2013, 8:45 AM

## 2013-04-13 NOTE — Progress Notes (Signed)
CARDIAC REHAB PHASE I   PRE:  Rate/Rhythm: 78 SR  BP:  Supine: 172/78  Sitting:   Standing:    SaO2: 90-91 3L  MODE:  Ambulation: 550 ft   POST:  Rate/Rhythm: 86 SR  BP:  Supine:   Sitting: 192/85  Standing:    SaO2: 92 4L 1055-1135 On arrival pt in bed O2 sat on 3L 90-91%. Assisted X 1 used walker and O2 3L to ambulate. Gait steady with walker. In hall after walking 175 feet sat 84% on 3L. O2 increased to 4L and sat improved to 91%. Walked rest of walk on 4L. Sat on return to room 92%. Pt to recliner after walk with call light in reach and left him on 3L of O2 after walk. Encouraged use of IS and more walks today.  Rodney Langton RN 04/13/2013 11:30 AM

## 2013-04-13 NOTE — Consult Note (Signed)
Physical Medicine and Rehabilitation Consult Reason for Consult: Deconditioning after CABG Referring Physician: Dr. Prescott Gum   HPI: Leonard [name] is a 63 y.o. 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 Patient independent prior to admission living with his sister. Presented 04/04/2013 with progressive shortness of breath findings of mildly elevated cardiac enzymes. Chest x-ray showed pulmonary edema. Echocardiogram demonstrated left ventricular hypertrophy with diastolic dysfunction. No significant mitral valve disease. Cardiac catheterization showed high-grade stenosis of the LAD, high-grade stenosis of the circumflex. Underwent CABG x3 with endoscopic harvest right leg and saphenous vein 04/08/2013 per Dr.Van trigt. Postoperative course pain management. Bouts of fluid overload responded well to intravenous Lasix. And mildly elevated creatinine 1.60 and monitored while on IV fluids with latest creatinine 0.85. Hemoglobin A1c 8.6 and remained on insulin therapy as directed. Physical therapy evaluation completed 04/12/2013 with recommendations of physical medicine rehabilitation consult.   Review of Systems  Respiratory: Positive for shortness of breath.   Cardiovascular: Positive for leg swelling.  Gastrointestinal: Positive for constipation.  Musculoskeletal: Positive for joint pain and myalgias.  All other systems reviewed and are negative.   Past Medical History  Diagnosis Date  . Hypertension   . PAD (peripheral artery disease)   . Gangrene of toe     right 5th/notes 01/04/2013   . High cholesterol   . Type II diabetes mellitus    Past Surgical History  Procedure Laterality Date  . Throat surgery  1983    polyps  . Angioplasty  / stenting femoral Right 01/13/2013    superficial femoral artery x 2 (3 mm x 60 mm, 4 mm x 60 mm)  . Tonsillectomy and adenoidectomy  ~ 1965  . Amputation Right 01/28/2013    Procedure: RAY AMPUTATION RIGHT  4th & 5th TOE;  Surgeon: Rosetta Posner, MD;  Location: [facility] OR;  Service: Vascular;  Laterality: Right;  . Coronary artery bypass graft N/A 04/07/2013    Procedure: CORONARY ARTERY BYPASS GRAFTING (CABG);  Surgeon: Ivin Poot, MD;  Location: [place];  Service: Open Heart Surgery;  Laterality: N/A;  . Intraoperative transesophageal echocardiogram N/A 04/07/2013    Procedure: INTRAOPERATIVE TRANSESOPHAGEAL ECHOCARDIOGRAM;  Surgeon: Ivin Poot, MD;  Location: [place];  Service: Open Heart Surgery;  Laterality: N/A;   Family History  Problem Relation Age of Onset  . Cancer Mother     lung cancer  . Hypertension Mother   . Cancer Sister     patient thinks it was uterine cancer  . Hypertension Sister    Social History:  reports that he has quit smoking. His smoking use included Cigarettes. He has a 44 pack-year smoking history. He has never used smokeless tobacco. He reports that he does not drink alcohol or use illicit drugs. Allergies: No Known Allergies Medications Prior to Admission  Medication Sig Dispense Refill  . amLODipine (NORVASC) 10 MG tablet Take 1 tablet (10 mg total) by mouth daily.  90 tablet  1  . atenolol (TENORMIN) 100 MG tablet Take 1 tablet (100 mg total) by mouth daily.  90 tablet  3  . cloNIDine (CATAPRES) 0.2 MG tablet Take 1 tablet (0.2 mg total) by mouth 2 (two) times daily.  90 tablet  1  . glipiZIDE (GLUCOTROL) 10 MG tablet Take  1 tablet (10 mg total) by mouth 2 (two) times daily before a meal.  90 tablet  1  . hydrochlorothiazide (MICROZIDE) 12.5 MG capsule Take 1 capsule (12.5 mg total) by mouth daily.  30 capsule  0  . insulin glargine (LANTUS) 100 units/mL SOLN Inject 30 Units into the skin 2 (two) times daily.  10 mL  0  . lisinopril (PRINIVIL,ZESTRIL) 40  MG tablet Take 1 tablet (40 mg total) by mouth daily.  30 tablet  3  . metFORMIN (GLUCOPHAGE) 1000 MG tablet Take 1 tablet (1,000 mg total) by mouth 2 (two) times daily with a meal.  60 tablet  6  . nicotine (NICODERM CQ - DOSED IN MG/24 HR) 7 mg/24hr patch Place 1 patch (7 mg total) onto the skin daily.  28 patch  0  . pravastatin (PRAVACHOL) 40 MG tablet Take 1 tablet (40 mg total) by mouth daily.  90 tablet  1  . Blood Glucose Monitoring Suppl (ACCU-CHEK AVIVA) device Use as instructed  1 each  0  . glucose blood (ACCU-CHEK AVIVA) test strip Use as instructed  100 each  12  . Lancet Devices (ACCU-CHEK SOFTCLIX) lancets Use as instructed to test 4 times a day.  Dx code 250.00.  May change to fit patients meter  100 each  12  . Lancets (ACCU-CHEK SOFT TOUCH) lancets Use as instructed  100 each  12    Home: Home Living Family/patient expects to be discharged to:: Inpatient rehab Living Arrangements: Other relatives Available Help at Discharge: Family;Available 24 hours/day Type of Home: Apartment Home Access: Level entry Home Layout: One level Home Equipment: None  Functional History:   Functional Status:  Mobility:     Ambulation/Gait Ambulation Distance (Feet): 150 Feet General Gait Details: pt a little impulsive and with Bil lateral sway.  pt needs A to prevent running into objects as pt having difficulty problem solving negotiating around obstacles.      ADL: ADL Grooming: Performed;Modified independent Where Assessed - Grooming: Unsupported standing Upper Body Bathing: Simulated;Modified independent Where Assessed - Upper Body Bathing: Unsupported sitting Lower Body Bathing: Simulated;Modified independent Where Assessed - Lower Body Bathing: Unsupported sitting;Unsupported standing Upper Body Dressing: Performed;Modified independent Where Assessed - Upper Body Dressing: Unsupported sitting Lower Body Dressing: Performed;Modified independent Where Assessed - Lower Body  Dressing: Unsupported sitting;Unsupported standing Toilet Transfer: Performed;Modified independent Toilet Transfer Method: Sit to Loss adjuster, chartered: Regular height toilet Tub/Shower Transfer: Performed;Simulated;Modified independent Tub/Shower Transfer Method: Therapist, art: Grab bars  Cognition: Cognition Overall Cognitive Status: Impaired/Different from baseline Orientation Level: Oriented X4 Cognition Arousal/Alertness:  (Drowsy, but arousable.  ) Behavior During Therapy: WFL for tasks assessed/performed Overall Cognitive Status: Impaired/Different from baseline Area of Impairment: Attention;Memory;Following commands;Safety/judgement;Awareness;Problem solving;Orientation Orientation Level: Disoriented to;Time Current Attention Level: Sustained Memory: Decreased short-term memory;Decreased recall of precautions Following Commands: Follows one step commands inconsistently Safety/Judgement: Decreased awareness of safety;Decreased awareness of deficits Awareness: Intellectual;Emergent;Anticipatory Problem Solving: Slow processing;Decreased initiation;Difficulty sequencing;Requires verbal cues;Requires tactile cues General Comments: No family present to confirm baseline cognition, however on previous PT eval on 3/2 pt did not have any cognitive deficits.    Blood pressure 139/69, pulse 71, temperature 98.2 F (36.8 C), temperature source Oral, resp. rate 18, height 5\' 8"  (1.727 m), weight 101.4 kg (223 lb 8.7 oz), SpO2 94.00%. Physical Exam  Vitals reviewed. Constitutional: He is oriented to person, place, and time. He appears well-developed and well-nourished.  63 year old African American male sitting up in chair in no acute distress.  HENT:  Head: Normocephalic.  Eyes: EOM are normal.  Neck: Normal range of motion. Neck supple. No JVD present. No tracheal deviation present. No thyromegaly present.  Cardiovascular: Normal rate and regular  rhythm.   Respiratory: Effort normal and breath sounds normal. No respiratory distress.  GI: Soft. Bowel sounds are normal. He exhibits no distension.  Musculoskeletal: He exhibits edema (right greater than left lower ext).  Lymphadenopathy:    He has no cervical adenopathy.  Neurological: He is alert and oriented to person, place, and time. No cranial nerve deficit. Coordination normal.  Follows commands. Correctly gave me the full date. Good insight and awareness. Strength 4/5 at least in all 4 limbs. Sensation intact.   Skin:  Midline chest incision clean and dry  Psychiatric: He has a normal mood and affect. His behavior is normal. Judgment and thought content normal.    Results for orders placed during the hospital encounter of 04/04/13 (from the past 24 hour(s))  GLUCOSE, CAPILLARY     Status: Abnormal   Collection Time    04/12/13  4:28 PM      Result Value Ref Range   Glucose-Capillary 118 (*) 70 - 99 mg/dL  GLUCOSE, CAPILLARY     Status: Abnormal   Collection Time    04/12/13  8:51 PM      Result Value Ref Range   Glucose-Capillary 184 (*) 70 - 99 mg/dL  BASIC METABOLIC PANEL     Status: Abnormal   Collection Time    04/13/13  5:00 AM      Result Value Ref Range   Sodium 143  137 - 147 mEq/L   Potassium 3.8  3.7 - 5.3 mEq/L   Chloride 104  96 - 112 mEq/L   CO2 27  19 - 32 mEq/L   Glucose, Bld 190 (*) 70 - 99 mg/dL   BUN 18  6 - 23 mg/dL   Creatinine, Ser 0.85  0.50 - 1.35 mg/dL   Calcium 8.7  8.4 - 10.5 mg/dL   GFR calc non Af Amer >90  >90 mL/min   GFR calc Af Amer >90  >90 mL/min  GLUCOSE, CAPILLARY     Status: Abnormal   Collection Time    04/13/13  6:36 AM      Result Value Ref Range   Glucose-Capillary 182 (*) 70 - 99 mg/dL   Comment 1 Documented in Chart     Comment 2 Notify RN    GLUCOSE, CAPILLARY     Status: Abnormal   Collection Time    04/13/13 12:19 PM      Result Value Ref Range   Glucose-Capillary 202 (*) 70 - 99 mg/dL   Dg Chest 2  View  04/13/2013   CLINICAL DATA CABG  EXAM CHEST  2 VIEW  COMPARISON 04/12/2013  FINDINGS Central venous catheter tip in the SVC.  No pneumothorax.  No change in bibasilar atelectasis and small pleural effusions.  Mild vascular congestion also unchanged.  IMPRESSION No significant change  SIGNATURE  Electronically Signed   By: Franchot Gallo M.D.   On: 04/13/2013 07:19   Dg Chest 2 View  04/12/2013   CLINICAL DATA:  CABG  EXAM: CHEST  2 VIEW  COMPARISON:  04/11/2013  FINDINGS: Right jugular central venous catheter tip in the SVC unchanged. No pneumothorax.  Bilateral effusions and bibasilar atelectasis, right greater than left, unchanged from the prior study. Negative for edema.  IMPRESSION: Bibasilar atelectasis and effusion unchanged. No change from yesterday.  Electronically Signed   By: Franchot Gallo M.D.   On: 04/12/2013 11:08    Assessment/Plan: Diagnosis: weakness/gait deficits after CABG/ resolving encephalopathy 1. Does the need for close, 24 hr/day medical supervision in concert with the patient's rehab needs make it unreasonable for this patient to be served in a less intensive setting? Potentially 2. Co-Morbidities requiring supervision/potential complications: dm 2, CAD,  3. Due to bladder management, bowel management, safety, skin/wound care, disease management, medication administration, pain management and patient education, does the patient require 24 hr/day rehab nursing? Yes 4. Does the patient require coordinated care of a physician, rehab nurse, PT (1-2 hrs/day, 5 days/week) and OT (1-2 hrs/day, 5 days/week) to address physical and functional deficits in the context of the above medical diagnosis(es)? Yes Addressing deficits in the following areas: balance, endurance, locomotion, strength, transferring, bowel/bladder control, bathing, dressing, feeding, grooming, toileting and psychosocial support 5. Can the patient actively participate in an intensive therapy program of at  least 3 hrs of therapy per day at least 5 days per week? Yes 6. The potential for patient to make measurable gains while on inpatient rehab is good and fair 7. Anticipated functional outcomes upon discharge from inpatient rehab are TBD with PT, TBD with OT, n/a with SLP. 8. Estimated rehab length of stay to reach the above functional goals is: one week if needed 9. Does the patient have adequate social supports to accommodate these discharge functional goals? Yes 10. Anticipated D/C setting: Home 11. Anticipated post D/C treatments: [place] therapy 12. Overall Rehab/Functional Prognosis: excellent  RECOMMENDATIONS: This patient's condition is appropriate for continued rehabilitative care in the following setting: CIR Patient has agreed to participate in recommended program. Yes Note that insurance prior authorization may be required for reimbursement for recommended care.  Comment: This pt is making progress. Cognitively he appears much improved. Has sister at home who can assist. If no further functional progress by tomorrow would consider brief CIR admit. Otherwise he could go home with [facility] when medically necessary.   Meredith Staggers, MD, [facility]     04/13/2013

## 2013-04-14 ENCOUNTER — Ambulatory Visit: Payer: Medicaid Other | Admitting: Pharmacist

## 2013-04-14 LAB — GLUCOSE, CAPILLARY
Glucose-Capillary: 156 mg/dL — ABNORMAL HIGH (ref 70–99)
Glucose-Capillary: 208 mg/dL — ABNORMAL HIGH (ref 70–99)
Glucose-Capillary: 153 mg/dL — ABNORMAL HIGH (ref 70–99)
Glucose-Capillary: 113 mg/dL — ABNORMAL HIGH (ref 70–99)

## 2013-04-14 MED ORDER — POTASSIUM CHLORIDE CRYS ER 20 MEQ PO TBCR
20.0000 meq | EXTENDED_RELEASE_TABLET | Freq: Every day | ORAL | Status: DC
  Administered 2013-04-14 – 2013-04-20 (×7): 20 meq via ORAL
  Filled 2013-04-14 (×8): qty 1

## 2013-04-14 MED ORDER — FUROSEMIDE 40 MG PO TABS
40.0000 mg | ORAL_TABLET | Freq: Every day | ORAL | Status: DC
  Administered 2013-04-14 – 2013-04-20 (×7): 40 mg via ORAL
  Filled 2013-04-14 (×7): qty 1

## 2013-04-14 NOTE — Progress Notes (Addendum)
       [address]       [place] [address]             [phone]          7 Days Post-Op Procedure(s) (LRB): CORONARY ARTERY BYPASS GRAFTING (CABG) (N/A) INTRAOPERATIVE TRANSESOPHAGEAL ECHOCARDIOGRAM (N/A)  Subjective: Comfortable, no complaints.   Objective: Vital signs in last 24 hours: Patient Vitals for the past 24 hrs:  BP Temp Temp src Pulse Resp SpO2 Weight  04/14/13 0452 138/87 mmHg 98.1 F (36.7 C) Oral 74 19 96 % 218 lb 4.1 oz (99 kg)  04/13/13 2154 - - - - - 94 % -  04/13/13 2109 131/82 mmHg 98.3 F (36.8 C) Oral 77 18 94 % -  04/13/13 1524 - - - - - 90 % -  04/13/13 1337 139/69 mmHg 98.2 F (36.8 C) Oral 71 18 94 % -  04/13/13 1256 156/70 mmHg - - - - - -  04/13/13 1045 178/76 mmHg - - 79 - - -  04/13/13 1030 173/71 mmHg - - 79 - - -  04/13/13 1015 174/71 mmHg - - 86 - - -  04/13/13 1010 158/81 mmHg - - 80 18 92 % -  04/13/13 0952 186/91 mmHg - - 87 18 - -  04/13/13 0834 - - - - - 90 % -   Current Weight  04/14/13 218 lb 4.1 oz (99 kg)  PRE-OPERATIVE WEIGHT: 97.8kg    Intake/Output from previous day: 03/11 0701 - 03/12 0700 In: 840 [P.O.:840] Out: 1250 [Urine:1250] CBGs 183-136-156   PHYSICAL EXAM:  Heart: RRR Lungs: Clear Wound: Clean and dry Extremities: Mild LE edema    Lab Results: CBC: Recent Labs  04/12/13 0342  WBC 5.3  HGB 8.7*  HCT 27.4*  PLT 154   BMET:  Recent Labs  04/12/13 0342 04/13/13 0500  NA 142 143  K 4.0 3.8  CL 105 104  CO2 25 27  GLUCOSE 231* 190*  BUN 26* 18  CREATININE 0.91 0.85  CALCIUM 8.3* 8.7    PT/INR: No results found for this basename: LABPROT, INR,  in the last 72 hours    Assessment/Plan: S/P Procedure(s) (LRB): CORONARY ARTERY BYPASS GRAFTING (CABG) (N/A) INTRAOPERATIVE TRANSESOPHAGEAL ECHOCARDIOGRAM (N/A) CV- SR, BPs improved. Continue Lopressor, Norvasc, Clonidine, Lisinopril. Pulm- continue IS/FV, Mucinex. Wean O2 as tolerated. Down to 2L. Vol overload-  diuresing well.  Will switch to po Lasix and watch. DM- A1C=8.6 Sugars improving on home meds. Will continue Levemir, Glucotrol,  Metformin. A/CRI- Cr stable.  Expected postop blood loss anemia- H/H stable.  Continue PT, CRPI.  Disp- CIR to re-evaluate today for admission.     LOS: 10 days    COLLINS,GINA H 04/14/2013  Keep in hospital until pulmonary status better if not accepted by CIRfollow R effusion with CXR in am  patient examined and medical record reviewed,agree with above note. [name],PETER 04/14/2013

## 2013-04-14 NOTE — Progress Notes (Signed)
Physical Therapy Treatment Patient Details Name: Leonard [name] MRN: [id number] DOB: Jun 26, 1950 Today's Date: 04/14/2013 Time: [id number] PT Time Calculation (min): 19 min  PT Assessment / Plan / Recommendation  History of Present Illness pt presented with acute pulmonary edema and volume overload, who is now s/p CABG.     PT Comments   Pt mildly unsteady with use of RW during gait and mobility in/around the room. He should be safe with help from his family member that is staying with him.  Will have OT see how he does with ADL's in a homelike environment.  Follow Up Recommendations  Home health PT     Does the patient have the potential to tolerate intense rehabilitation     Barriers to Discharge        Equipment Recommendations  Rolling walker with 5" wheels;3in1 (PT)    Recommendations for Other Services    Frequency Min 3X/week   Progress towards PT Goals Progress towards PT goals: Progressing toward goals  Plan Current plan remains appropriate    Precautions / Restrictions Precautions Precautions: Sternal   Pertinent Vitals/Pain     Mobility  Bed Mobility Overal bed mobility: Needs Assistance Bed Mobility: Supine to Sit Supine to sit: Mod assist General bed mobility comments: legs slid off bed and pt then no longer had the ability to build momentum. Transfers Overall transfer level: Needs assistance Transfers: Sit to/from Stand Sit to Stand: Min guard General transfer comment: cues for sternal precautions; cues to come further forward Ambulation/Gait Ambulation/Gait assistance: Min guard Ambulation Distance (Feet): 500 Feet Assistive device: Rolling walker (2 wheeled) Gait Pattern/deviations: Step-through pattern Gait velocity: pt able to speed up cadence, but with consequence of decr sats. Gait velocity interpretation: at or above normal speed for age/gender General Gait Details: pt tended to wander around/drift with the RW.  Often attempted to let go and  walk away from RW, but occaisionally staggered or drifted around even with the RW    Exercises     PT Diagnosis:    PT Problem List:   PT Treatment Interventions:     PT Goals (current goals can now be found in the care plan section) Acute Rehab PT Goals PT Goal Formulation: With patient Time For Goal Achievement: 04/26/13 Potential to Achieve Goals: Good  Visit Information  Last PT Received On: 04/14/13 Assistance Needed: +1 History of Present Illness: pt presented with acute pulmonary edema and volume overload, who is now s/p CABG.      Subjective Data      Cognition  Cognition Arousal/Alertness: Awake/alert Behavior During Therapy: WFL for tasks assessed/performed Overall Cognitive Status: Within Functional Limits for tasks assessed Following Commands: Follows one step commands consistently Problem Solving: Decreased initiation    Balance  Balance Overall balance assessment: Needs assistance Sitting-balance support: Feet supported;No upper extremity supported Sitting balance-Leahy Scale: Fair Standing balance support: Bilateral upper extremity supported;During functional activity Standing balance-Leahy Scale: Fair  End of Session PT - End of Session Equipment Utilized During Treatment: Gait belt;Oxygen Activity Tolerance: Patient tolerated treatment well Patient left: with call bell/phone within reach (at [facility]) Nurse Communication: Mobility status   GP     Zachery Niswander, Tessie Fass 04/14/2013, 1:14 PM 04/14/2013  Donnella Sham, [place] [phone]  (pager)

## 2013-04-14 NOTE — Evaluation (Signed)
Occupational Therapy Evaluation Patient Details Name: Leonard [name] MRN: [id number] DOB: 1950/06/11 Today's Date: 04/14/2013 Time: [id number] OT Time Calculation (min): 18 min  OT Assessment / Plan / Recommendation History of present illness pt presented with acute pulmonary edema and volume overload, who is now s/p CABG.     Clinical Impression   Pt demonstrates mild balance deficits and decreased activity tolerance interfering with ability to perform ADL and ADL. transfers.   He needs assistance for self care activities involving sit to stand and standing.  He will need to use his sister's shower seat to conserve energy at home and utilize breathing techniques.  Will follow to educate.  Pt should be safe to discharge home.  He reports his family will assist him as needed.  OT Assessment  Patient needs continued OT Services    Follow Up Recommendations  Home health OT    Barriers to Discharge      Equipment Recommendations  None recommended by OT    Recommendations for Other Services    Frequency  Min 2X/week    Precautions / Restrictions Precautions Precautions: Sternal;Fall Precaution Comments: instructed pt in sternal precautions related to ADL and mobility, able to restate Restrictions Weight Bearing Restrictions: Yes Other Position/Activity Restrictions: sternal precautions   Pertinent Vitals/Pain VSS, on 3L 02, no pain reported    ADL  Eating/Feeding: Independent Where Assessed - Eating/Feeding: Edge of bed Grooming: Wash/dry hands;Wash/dry face;Min guard Where Assessed - Grooming: Unsupported standing Upper Body Bathing: Set up Where Assessed - Upper Body Bathing: Unsupported sitting Lower Body Bathing: Supervision/safety;Min guard Where Assessed - Lower Body Bathing: Unsupported sitting;Supported sit to stand Upper Body Dressing: Set up Where Assessed - Upper Body Dressing: Unsupported sitting Lower Body Dressing: Supervision/safety;Min guard;Modified  independent Where Assessed - Lower Body Dressing: Unsupported sitting;Supported sit to stand Toilet Transfer: Minimal assistance Toilet Transfer Method: Sit to stand Toilet Transfer Equipment: Regular height toilet Equipment Used: Gait belt;Rolling walker Transfers/Ambulation Related to ADLs: min assist with RW, staggers ADL Comments: Pt easily able to access feet crossing his foot over the opposite knee.      OT Diagnosis: Generalized weakness  OT Problem List: Decreased strength;Decreased activity tolerance;Impaired balance (sitting and/or standing);Decreased safety awareness;Decreased knowledge of use of DME or AE;Decreased knowledge of precautions;Cardiopulmonary status limiting activity OT Treatment Interventions: Self-care/ADL training;DME and/or AE instruction;Energy conservation;Therapeutic activities;Patient/family education;Balance training   OT Goals(Current goals can be found in the care plan section) Acute Rehab OT Goals Patient Stated Goal: to go home OT Goal Formulation: With patient Time For Goal Achievement: 04/21/13 Potential to Achieve Goals: Good  Visit Information  Last OT Received On: 04/14/13 Assistance Needed: +1 History of Present Illness: pt presented with acute pulmonary edema and volume overload, who is now s/p CABG.         Prior [place] expects to be discharged to:: Private residence Living Arrangements: Other relatives (sister and nephew) Available Help at Discharge: Family;Available 24 hours/day Type of Home: Apartment Home Access: Level entry Home Layout: One level Home Equipment: None Additional Comments: sister has a shower seat pt can use Prior Function Level of Independence: Independent Communication Communication: No difficulties Dominant Hand: Right         Vision/Perception Vision - History Visual History: Cataracts Patient Visual Report: No change from baseline   Cognition   Cognition Arousal/Alertness: Awake/alert Behavior During Therapy: WFL for tasks assessed/performed Overall Cognitive Status: Within Functional Limits for tasks assessed Following Commands: Follows one  step commands consistently Problem Solving: Decreased initiation    Extremity/Trunk Assessment Upper Extremity Assessment Upper Extremity Assessment: Overall WFL for tasks assessed Lower Extremity Assessment Lower Extremity Assessment: Generalized weakness Cervical / Trunk Assessment Cervical / Trunk Assessment: Normal     Mobility Bed Mobility Overal bed mobility: Needs Assistance Bed Mobility: Supine to Sit;Sit to Supine Supine to sit: Min guard Sit to supine: Modified independent (Device/Increase time) General bed mobility comments: Pt used momentum spontaneously to perform supine to sit Transfers Overall transfer level: Needs assistance Equipment used: Rolling walker (2 wheeled) Transfers: Sit to/from Stand Sit to Stand: Min guard General transfer comment: cues for sternal precautions; cues to come further forward     Exercise     Balance Balance Overall balance assessment: Needs assistance Sitting-balance support: Feet supported;No upper extremity supported Sitting balance-Leahy Scale: Fair Standing balance support: Bilateral upper extremity supported;During functional activity Standing balance-Leahy Scale: Fair   End of Session OT - End of Session Activity Tolerance: Patient tolerated treatment well Patient left: in bed;with call bell/phone within reach;with nursing/sitter in room  GO     Malka So 04/14/2013, 2:02 PM [phone]

## 2013-04-14 NOTE — Progress Notes (Addendum)
CARDIAC REHAB PHASE I   PRE:  Rate/Rhythm: 75 SR    BP: sitting 122/58    SaO2: 96 2L  MODE:  Ambulation: 500 ft   POST:  Rate/Rhythm: 82 SR    BP: sitting 110/60     SaO2: 90 2L  Pt walking with RW, very quick pace. Unaware of his surroundings and equipment (O2, IV pole). Hit objects in hall. Pt became SOB and needed x3 rest stops. VSS, SaO2 90 2L after walk. Pt does not seem confused but does not understand safety. To recliner, will f/u. [id number]  Darrick Meigs CES, ACSM 04/14/2013 3:28 PM

## 2013-04-14 NOTE — Progress Notes (Signed)
Pt ambulated 200 ft with RW and O2 at 2L.  Gait somewhat erradic but good balance, no complaints.  Wanted to walk more but RN unable to spend more time now.  To side of bed for breakfast with call bell in reach.  Will cont plan of care.

## 2013-04-14 NOTE — Progress Notes (Signed)
Rehab admissions - noted pt's overall progress with activity and that he is doing well, ambulating 500' with minguard assistance. Both PT/OT are recommending home with home health services and I agree that this is a reasonable DC plan.   Pt no longer appears to need intensive services of acute inpatient rehab and recommend home with home health services. Spoke with Almyra Free, case manager and Ria Comment, Education officer, museum as well to share this update.  Thank you,  Nanetta Batty, PT Rehabilitation Admissions Coordinator [phone]

## 2013-04-15 ENCOUNTER — Inpatient Hospital Stay (HOSPITAL_COMMUNITY): Payer: Medicaid Other

## 2013-04-15 LAB — GLUCOSE, CAPILLARY
GLUCOSE-CAPILLARY: 122 mg/dL — AB (ref 70–99)
GLUCOSE-CAPILLARY: 132 mg/dL — AB (ref 70–99)
Glucose-Capillary: 147 mg/dL — ABNORMAL HIGH (ref 70–99)
Glucose-Capillary: 147 mg/dL — ABNORMAL HIGH (ref 70–99)

## 2013-04-15 MED ORDER — LEVALBUTEROL HCL 1.25 MG/0.5ML IN NEBU
1.2500 mg | INHALATION_SOLUTION | Freq: Four times a day (QID) | RESPIRATORY_TRACT | Status: DC | PRN
  Filled 2013-04-15: qty 0.5

## 2013-04-15 NOTE — Progress Notes (Signed)
Per CM, patient needs snf placement. CSW faxed over clinicals to facilities and weekend social worker will follow up with bed offers.  Jeanette Caprice, MSW, [employer]

## 2013-04-15 NOTE — Progress Notes (Signed)
CARDIAC REHAB PHASE I   PRE:  Rate/Rhythm: 74SR  BP:  Supine:   Sitting: 150/80  Standing:    SaO2: 85%RA , 90-92% 2L  MODE:  Ambulation: 740 ft   POST:  Rate/Rhythm: 83-89 SR   BP:  Supine:   Sitting: 152/80  Standing:    SaO2: 86% 2L, 88%- 92% 3L hall [id number] Condom off hanging off when I entered room. Removed and helped pt with urinal. Notified staff that condom removed. Pt on RA at rest and sats 85%. Put on 2L to begin walk and had to increase to 3L to keep sats up. Pt walked 740 ft with rolling walker and asst x 1. Encouraged pt to slow down with walking to be safer. Pt tolerated well. To recliner after walk. Call bell in reach. Left on 3L.   Graylon Good, RN BSN  04/15/2013 11:59 AM

## 2013-04-15 NOTE — Progress Notes (Addendum)
       [address]       [place] [address]             [phone]          8 Days Post-Op Procedure(s) (LRB): CORONARY ARTERY BYPASS GRAFTING (CABG) (N/A) INTRAOPERATIVE TRANSESOPHAGEAL ECHOCARDIOGRAM (N/A)  Subjective: "I'm ready to walk this morning." Feeling well.  Still with some cough.   Objective: Vital signs in last 24 hours: Patient Vitals for the past 24 hrs:  BP Temp Temp src Pulse Resp SpO2 Weight  04/15/13 0300 133/80 mmHg 98.2 F (36.8 C) Oral 74 18 96 % 215 lb 4.8 oz (97.659 kg)  04/14/13 2124 - - - - - 95 % -  04/14/13 2042 157/80 mmHg 98.5 F (36.9 C) Oral 76 18 95 % -  04/14/13 1443 130/83 mmHg 98.4 F (36.9 C) Oral 76 18 95 % -  04/14/13 1434 - - - - - 95 % -  04/14/13 0921 - - - - - 96 % -   Current Weight  04/15/13 215 lb 4.8 oz (97.659 kg)  PRE-OPERATIVE WEIGHT: 97.8kg    Intake/Output from previous day: 03/12 0701 - 03/13 0700 In: 960 [P.O.:960] Out: 1990 [Urine:1990]  CBGs 153-113-122   PHYSICAL EXAM:  Heart: RRR Lungs: Few coarse BS in bases bilaterally Wound: Clean and dry Extremities: Trace LE edema    Lab Results: CBC:No results found for this basename: WBC, HGB, HCT, PLT,  in the last 72 hours BMET:  Recent Labs  04/13/13 0500  NA 143  K 3.8  CL 104  CO2 27  GLUCOSE 190*  BUN 18  CREATININE 0.85  CALCIUM 8.7    PT/INR: No results found for this basename: LABPROT, INR,  in the last 72 hours  CXR: stable effusions, slightly improved   Assessment/Plan: S/P Procedure(s) (LRB): CORONARY ARTERY BYPASS GRAFTING (CABG) (N/A) INTRAOPERATIVE TRANSESOPHAGEAL ECHOCARDIOGRAM (N/A)  CV- SR, BPs improved. Continue Lopressor, Norvasc, Clonidine, Lisinopril.   Pulm- continue IS/FV, Mucinex. Wean O2 as tolerated. Down to 2L.   Vol overload- Continue po Lasix.   DM- A1C=8.6 Sugars improved on home meds. Will continue Levemir, Glucotrol, Metformin.   A/CRI- Cr stable.   Expected postop blood loss  anemia- H/H stable.   Continue PT, CRPI.  Disp- Not felt to be a candidate for CIR now, so will need to stay in hospital for now per MD until pulm status improved.    LOS: 11 days    COLLINS,GINA H 04/15/2013  Not safe to send patient home and basically alone Check CXR , R effusion

## 2013-04-15 NOTE — Progress Notes (Signed)
Occupational Therapy Treatment Patient Details Name: Leonard [name] MRN: [id number] DOB: 31-Dec-1950 Today's Date: 04/15/2013 Time: [id number] OT Time Calculation (min): 12 min  OT Assessment / Plan / Recommendation  History of present illness   pt presented with acute pulmonary edema and volume overload, who is now s/p CABG.     OT comments  Pt progressing toward goals. Requires frequent cues for safety and to slow down.  Follow Up Recommendations  Home health OT;Supervision/Assistance - 24 hour    Barriers to Discharge       Equipment Recommendations  None recommended by OT    Recommendations for Other Services    Frequency Min 2X/week   Progress towards OT Goals Progress towards OT goals: Progressing toward goals  Plan Discharge plan remains appropriate    Precautions / Restrictions Precautions Precautions: Sternal;Fall Restrictions Weight Bearing Restrictions: Yes   Pertinent Vitals/Pain SpO2 on 2L >90% during activity    ADL  Grooming: Wash/dry hands;Wash/dry face;Min guard Where Assessed - Grooming: Unsupported standing Lower Body Dressing: Performed;Min guard Where Assessed - Lower Body Dressing: Unsupported sit to stand Toilet Transfer: Simulated;Min guard Toilet Transfer Method: Sit to Loss adjuster, chartered: Other (comment) (bed) Equipment Used: Gait belt;Rolling walker Transfers/Ambulation Related to ADLs: Min guard with RW. Cues to slow down. ADL Comments: Pt requires frequent cues to slow down and for safe use of RW.      OT Diagnosis:    OT Problem List:   OT Treatment Interventions:     OT Goals(current goals can now be found in the care plan section) Acute Rehab OT Goals Patient Stated Goal: to go home OT Goal Formulation: With patient Time For Goal Achievement: 04/21/13 Potential to Achieve Goals: Good ADL Goals Pt Will Perform Grooming: standing;with modified independence Pt Will Perform Lower Body Bathing: with modified  independence;sit to/from stand Pt Will Perform Lower Body Dressing: with modified independence;sit to/from stand Pt Will Transfer to Toilet: with modified independence;ambulating Pt Will Perform Toileting - Clothing Manipulation and hygiene: with modified independence;sit to/from stand Pt Will Perform Tub/Shower Transfer: Tub transfer;with supervision;ambulating;shower seat;rolling walker Additional ADL Goal #1: Pt will adhere to sternal precautions during ADL independently. Additional ADL Goal #2: Pt will gather items for ADL around room with RW independently.  Visit Information  Last OT Received On: 04/15/13    Subjective Data      Prior Functioning       Cognition  Cognition Arousal/Alertness: Awake/alert Behavior During Therapy: [facility] for tasks assessed/performed Overall Cognitive Status: Within Functional Limits for tasks assessed    Mobility  Bed Mobility Overal bed mobility: Needs Assistance Bed Mobility: Sidelying to Sit;Sit to Sidelying Sidelying to sit: Min guard Sit to sidelying: Min guard Transfers Overall transfer level: Needs assistance Transfers: Sit to/from Stand Sit to Stand: Min guard General transfer comment: VCs for sternal precautions and technique    Exercises      Balance    End of Session OT - End of Session Equipment Utilized During Treatment: Oxygen;Rolling walker;Gait belt Activity Tolerance: Other (comment);Patient limited by fatigue (requesting to go back to bed) Patient left: in bed;with call bell/phone within reach  GO   04/15/2013 Darrol Jump OTR/L Pager [phone] Office [phone]   Darrol Jump 04/15/2013, 4:35 PM

## 2013-04-16 ENCOUNTER — Inpatient Hospital Stay (HOSPITAL_COMMUNITY): Payer: Medicaid Other

## 2013-04-16 LAB — BASIC METABOLIC PANEL
BUN: 19 mg/dL (ref 6–23)
BUN: 20 mg/dL (ref 6–23)
CO2: 28 mEq/L (ref 19–32)
CO2: 24 mEq/L (ref 19–32)
Calcium: 9.4 mg/dL (ref 8.4–10.5)
Calcium: 9.5 mg/dL (ref 8.4–10.5)
Chloride: 101 mEq/L (ref 96–112)
Chloride: 101 mEq/L (ref 96–112)
Creatinine, Ser: 1.04 mg/dL (ref 0.50–1.35)
Creatinine, Ser: 1.01 mg/dL (ref 0.50–1.35)
GFR calc Af Amer: 88 mL/min — ABNORMAL LOW (ref >90)
GFR calc Af Amer: >90 mL/min (ref >90)
GFR calc non Af Amer: 76 mL/min — ABNORMAL LOW (ref >90)
GFR calc non Af Amer: 78 mL/min — ABNORMAL LOW (ref >90)
Glucose, Bld: 132 mg/dL — ABNORMAL HIGH (ref 70–99)
Glucose, Bld: 135 mg/dL — ABNORMAL HIGH (ref 70–99)
Potassium: 4.2 mEq/L (ref 3.7–5.3)
Potassium: 4.3 mEq/L (ref 3.7–5.3)
Sodium: 143 mEq/L (ref 137–147)
Sodium: 143 mEq/L (ref 137–147)

## 2013-04-16 LAB — GLUCOSE, CAPILLARY
Glucose-Capillary: 126 mg/dL — ABNORMAL HIGH (ref 70–99)
Glucose-Capillary: 107 mg/dL — ABNORMAL HIGH (ref 70–99)
Glucose-Capillary: 142 mg/dL — ABNORMAL HIGH (ref 70–99)
Glucose-Capillary: 139 mg/dL — ABNORMAL HIGH (ref 70–99)

## 2013-04-16 MED ORDER — ENOXAPARIN SODIUM 40 MG/0.4ML ~~LOC~~ SOLN
40.0000 mg | SUBCUTANEOUS | Status: DC
  Administered 2013-04-16 – 2013-04-19 (×4): 40 mg via SUBCUTANEOUS
  Filled 2013-04-16 (×5): qty 0.4

## 2013-04-16 MED ORDER — FUROSEMIDE 40 MG PO TABS
40.0000 mg | ORAL_TABLET | Freq: Every day | ORAL | Status: DC
  Administered 2013-04-16 – 2013-04-20 (×4): 40 mg via ORAL
  Filled 2013-04-16 (×5): qty 1

## 2013-04-16 NOTE — Progress Notes (Addendum)
[address]       [place], [address]             [phone]      9 Days Post-Op  Procedure(s) (LRB): CORONARY ARTERY BYPASS GRAFTING (CABG) (N/A) INTRAOPERATIVE TRANSESOPHAGEAL ECHOCARDIOGRAM (N/A) Subjective:  says he feels fine  Objective  Telemetry sinus rhythm  Temp:  [98.1 F (36.7 C)-98.4 F (36.9 C)] 98.4 F (36.9 C) (03/14 0457) Pulse Rate:  [69-77] 74 (03/14 0457) Resp:  [16-20] 18 (03/14 0457) BP: (111-157)/(59-80) 134/80 mmHg (03/14 0457) SpO2:  [90 %-95 %] 93 % (03/14 0457) Weight:  [215 lb 2.7 oz (97.6 kg)] 215 lb 2.7 oz (97.6 kg) (03/14 0457)   Intake/Output Summary (Last 24 hours) at 04/16/13 0956 Last data filed at 04/15/13 2100  Gross per 24 hour  Intake    720 ml  Output   1450 ml  Net   -730 ml       General appearance: alert, cooperative and no distress Heart: regular rate and rhythm Lungs: dim right>left base Abdomen: soft, nontender Extremities: mild edema Wound: incis healing well  Lab Results:  Recent Labs  04/16/13 0513  NA 143  K 4.2  CL 101  CO2 28  GLUCOSE 132*  BUN 19  CREATININE 1.04  CALCIUM 9.4   No results found for this basename: AST, ALT, ALKPHOS, BILITOT, PROT, ALBUMIN,  in the last 72 hours No results found for this basename: LIPASE, AMYLASE,  in the last 72 hours No results found for this basename: WBC, NEUTROABS, HGB, HCT, MCV, PLT,  in the last 72 hours No results found for this basename: CKTOTAL, CKMB, TROPONINI,  in the last 72 hours No components found with this basename: POCBNP,  No results found for this basename: DDIMER,  in the last 72 hours No results found for this basename: HGBA1C,  in the last 72 hours No results found for this basename: CHOL, HDL, LDLCALC, TRIG, CHOLHDL,  in the last 72 hours No results found for this basename: TSH, T4TOTAL, FREET3, T3FREE, THYROIDAB,  in the last 72 hours No results found for this basename: VITAMINB12, FOLATE, FERRITIN, TIBC, IRON,  RETICCTPCT,  in the last 72 hours  Medications: Scheduled . amLODipine  10 mg Oral Daily  . aspirin EC  325 mg Oral Daily  . bisacodyl  10 mg Oral Daily   Or  . bisacodyl  10 mg Rectal Daily  . budesonide-formoterol  2 puff Inhalation BID  . cloNIDine  0.2 mg Oral BID  . docusate sodium  200 mg Oral Daily  . furosemide  40 mg Oral Daily  . glipiZIDE  10 mg Oral BID AC  . insulin aspart  0-15 Units Subcutaneous TID WC  . insulin aspart  0-5 Units Subcutaneous QHS  . insulin aspart  3 Units Subcutaneous TID WC  . insulin detemir  30 Units Subcutaneous BID  . lactulose  30 g Oral Daily  . lisinopril  40 mg Oral Daily  . metFORMIN  1,000 mg Oral BID WC  . metoprolol tartrate  25 mg Oral BID  . nicotine  14 mg Transdermal Daily  . pantoprazole  40 mg Oral Daily  . potassium chloride  20 mEq Oral Daily  . simvastatin  20 mg Oral QHS     Radiology/Studies:  Dg Chest 2 View  04/16/2013   CLINICAL DATA:  CABG.  Effusions in weakness.  EXAM: CHEST  2 VIEW  COMPARISON:  DG CHEST  2 VIEW dated 04/15/2013  FINDINGS: Stable heart size and mediastinal contours status post CABG. Small bilateral pleural effusions, right more than left, are unchanged. Bibasilar atelectasis is not progressed. No pneumothorax.  IMPRESSION: Unchanged postoperative atelectasis and small pleural effusions, right more than left.   Electronically Signed   By: Jorje Guild M.D.   On: 04/16/2013 08:19   Dg Chest 2 View  04/15/2013   CLINICAL DATA:  Postoperative day 5 post CABG, history hypertension, diabetes, smoking  EXAM: CHEST  2 VIEW  COMPARISON:  04/13/2013  FINDINGS: Enlargement of cardiac silhouette post CABG.  Mediastinal contours and pulmonary vascularity normal.  Bibasilar atelectasis and small pleural effusions greater on right.  Upper lungs clear.  Interval removal of right jugular line.  No pneumothorax or acute osseous findings.  IMPRESSION: Persistent bibasilar atelectasis and effusions greater on right.   Enlargement of cardiac silhouette post CABG.   Electronically Signed   By: Lavonia Dana M.D.   On: 04/15/2013 08:13    INR: Will add last result for INR, ABG once components are confirmed Will add last 4 CBG results once components are confirmed  Assessment/Plan: S/P Procedure(s) (LRB): CORONARY ARTERY BYPASS GRAFTING (CABG) (N/A) INTRAOPERATIVE TRANSESOPHAGEAL ECHOCARDIOGRAM (N/A)  1 overall doing well 2 sugars ok control 3 labs stable 4 mod right pleural effusion- may benefit from thoracentesis to help wean off O2 5 rehab still considering admission   LOS: 12 days    GOLD,WAYNE E 3/14/20159:56 AM  Continue to work on placement issues- SNF vs CIR U/S directed R thoracentesis patient examined and medical record reviewed,agree with above note. [name],PETER 04/16/2013

## 2013-04-16 NOTE — Progress Notes (Signed)
CARDIAC REHAB PHASE I   PRE:  Rate/Rhythm: 78 SR  BP:  Supine: 139/73 Sitting:   Standing:    SaO2: 91% 2L  MODE:  Ambulation: 550 ft   POST:  Rate/Rhythm: 82  BP:  Supine:   Sitting: 148/76  Standing:    SaO2: 91% 2L  1118-1151 Pt ambulated 550 ft with assist x1 and pushing rolling walker. Still walking too fast, encouraged to slow down for safety, otherwise tolerated ambulation well, VSS. To chair after walk with legs elevated.   Seward Carol, MS, ACSM CES

## 2013-04-16 NOTE — Progress Notes (Signed)
Pt. Complained of Chest pain, I assessed pt. And checked vital signs which were all stable and within normal limits, I put pt. Back On 2 Liters of Oxygen and advised Charge nurse of the pt.'s chest pain, I paged MD on call to see if EKG was needed and MD stated it was not needed and ordered vital signs Q1hour for 4 hours, I gave pt. 2 pain pills b/c Charge nurse stated Nitro was contraindicated after his recent CABG, pt's pain subsided and pt. Didn't show any signs of respiratory distress.

## 2013-04-16 NOTE — Progress Notes (Signed)
Pt. Called nurses station complaining of Chest pain again, pt. Had recently ambulated in the hallway with NT and no longer had his O2 on, I put pt. Back on 2 Liters of Oxygen, took vital signs which were due again anyway and all vital signs were normal and stable, I gave pt. 2 more pain pills which were due again at the time, pt. Did not appear to be in respiratory distress, I will continue to monitor pt.'s pain and cardiac status.

## 2013-04-17 LAB — GLUCOSE, CAPILLARY
GLUCOSE-CAPILLARY: 185 mg/dL — AB (ref 70–99)
Glucose-Capillary: 89 mg/dL (ref 70–99)
Glucose-Capillary: 132 mg/dL — ABNORMAL HIGH (ref 70–99)
Glucose-Capillary: 126 mg/dL — ABNORMAL HIGH (ref 70–99)

## 2013-04-17 NOTE — Progress Notes (Addendum)
[address]       [place] [address]             [phone]      10 Days Post-Op  Procedure(s) (LRB): CORONARY ARTERY BYPASS GRAFTING (CABG) (N/A) INTRAOPERATIVE TRANSESOPHAGEAL ECHOCARDIOGRAM (N/A) Subjective: conts to progress  Objective  Telemetry sinus rhythm   Temp:  [97.7 F (36.5 C)-98.4 F (36.9 C)] 98.1 F (36.7 C) (03/15 0500) Pulse Rate:  [64-78] 78 (03/15 0500) Resp:  [16-20] 20 (03/15 0500) BP: (97-148)/(55-78) 148/71 mmHg (03/15 0500) SpO2:  [88 %-97 %] 94 % (03/15 0549) Weight:  [208 lb 5.4 oz (94.5 kg)] 208 lb 5.4 oz (94.5 kg) (03/15 0500)   Intake/Output Summary (Last 24 hours) at 04/17/13 1009 Last data filed at 04/16/13 1753  Gross per 24 hour  Intake    360 ml  Output      0 ml  Net    360 ml       General appearance: alert, cooperative and no distress Heart: regular rate and rhythm Lungs: dim right>left base Abdomen: benign Extremities: min edema Wound: incis healing well  Lab Results:  Recent Labs  04/16/13 0513 04/16/13 1725  NA 143 143  K 4.2 4.3  CL 101 101  CO2 28 24  GLUCOSE 132* 135*  BUN 19 20  CREATININE 1.04 1.01  CALCIUM 9.4 9.5   No results found for this basename: AST, ALT, ALKPHOS, BILITOT, PROT, ALBUMIN,  in the last 72 hours No results found for this basename: LIPASE, AMYLASE,  in the last 72 hours No results found for this basename: WBC, NEUTROABS, HGB, HCT, MCV, PLT,  in the last 72 hours No results found for this basename: CKTOTAL, CKMB, TROPONINI,  in the last 72 hours No components found with this basename: POCBNP,  No results found for this basename: DDIMER,  in the last 72 hours No results found for this basename: HGBA1C,  in the last 72 hours No results found for this basename: CHOL, HDL, LDLCALC, TRIG, CHOLHDL,  in the last 72 hours No results found for this basename: TSH, T4TOTAL, FREET3, T3FREE, THYROIDAB,  in the last 72 hours No results found for this basename:  VITAMINB12, FOLATE, FERRITIN, TIBC, IRON, RETICCTPCT,  in the last 72 hours  Medications: Scheduled . amLODipine  10 mg Oral Daily  . aspirin EC  325 mg Oral Daily  . bisacodyl  10 mg Oral Daily   Or  . bisacodyl  10 mg Rectal Daily  . budesonide-formoterol  2 puff Inhalation BID  . cloNIDine  0.2 mg Oral BID  . docusate sodium  200 mg Oral Daily  . enoxaparin (LOVENOX) injection  40 mg Subcutaneous Q24H  . furosemide  40 mg Oral Daily  . furosemide  40 mg Oral Daily  . glipiZIDE  10 mg Oral BID AC  . insulin aspart  0-15 Units Subcutaneous TID WC  . insulin aspart  0-5 Units Subcutaneous QHS  . insulin aspart  3 Units Subcutaneous TID WC  . insulin detemir  30 Units Subcutaneous BID  . lactulose  30 g Oral Daily  . lisinopril  40 mg Oral Daily  . metFORMIN  1,000 mg Oral BID WC  . metoprolol tartrate  25 mg Oral BID  . nicotine  14 mg Transdermal Daily  . pantoprazole  40 mg Oral Daily  . potassium chloride  20 mEq Oral Daily  . simvastatin  20 mg Oral QHS  Radiology/Studies:  Dg Chest 2 View  04/16/2013   CLINICAL DATA:  CABG.  Effusions in weakness.  EXAM: CHEST  2 VIEW  COMPARISON:  DG CHEST 2 VIEW dated 04/15/2013  FINDINGS: Stable heart size and mediastinal contours status post CABG. Small bilateral pleural effusions, right more than left, are unchanged. Bibasilar atelectasis is not progressed. No pneumothorax.  IMPRESSION: Unchanged postoperative atelectasis and small pleural effusions, right more than left.   Electronically Signed   By: Jorje Guild M.D.   On: 04/16/2013 08:19    INR: Will add last result for INR, ABG once components are confirmed Will add last 4 CBG results once components are confirmed  Assessment/Plan: S/P Procedure(s) (LRB): CORONARY ARTERY BYPASS GRAFTING (CABG) (N/A) INTRAOPERATIVE TRANSESOPHAGEAL ECHOCARDIOGRAM (N/A)  1 stable on O2, will order thoracentesis for tomorrow 2 conts to be evaluated for placement     LOS: 13 days     GOLD,WAYNE E 3/15/201510:09 AM  patient examined and medical record reviewed,agree with above note. [name],PETER 04/17/2013

## 2013-04-17 NOTE — Progress Notes (Signed)
Subjective: Pt is a 63 y.o. 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 He has been diuresing and weaning O2 supplementation. Today he complains of some incisional pain which is relieved with oxycodone. Otherwise he feels well.  Objective:  Filed Vitals:   04/17/13 0500  BP: 148/71  Pulse: 78  Temp: 98.1 F (36.7 C)  Resp: 20  Exam:  Gen: He appears comfortable, speaking fluidly in full sentences, no apparent distress.  HEENT: Neck supple. No JVD noted on exam. No cervical adenopathy.  CV: RRR, no murmur PULM: CTAB, normal WOB  ABD: soft, nontender  EXTS: trace bilateral LE edema noted.  A/P: Resolving pulmonary edema, post-operatively doing well. Will likely need SNF placement for rehabilitation. We appreciate cardiothoracic surgery's excellent care of this patient.  Beverlyn Roux, MD, MPH [facility] PGY-1 04/17/2013 8:20 AM

## 2013-04-17 NOTE — Progress Notes (Signed)
Pt was asleep at beginning of shift, 1930. Later he stated that his pain was finally relieved and has had no further complaints.

## 2013-04-18 ENCOUNTER — Inpatient Hospital Stay (HOSPITAL_COMMUNITY): Payer: Medicaid Other

## 2013-04-18 LAB — GLUCOSE, CAPILLARY
GLUCOSE-CAPILLARY: 80 mg/dL (ref 70–99)
GLUCOSE-CAPILLARY: 98 mg/dL (ref 70–99)
Glucose-Capillary: 84 mg/dL (ref 70–99)
Glucose-Capillary: 117 mg/dL — ABNORMAL HIGH (ref 70–99)

## 2013-04-18 NOTE — Progress Notes (Signed)
Pt ambulated in the hallway almost 600 ft using front wheel walker on room air,tolerated well.

## 2013-04-18 NOTE — Progress Notes (Signed)
1430 Pt just walked with OT and walked with PT earlier. We will follow up tomorrow. Graylon Good RN BSN 04/18/2013 2:30 PM '

## 2013-04-18 NOTE — Progress Notes (Addendum)
       [address]       [place] [address]             [phone]          11 Days Post-Op Procedure(s) (LRB): CORONARY ARTERY BYPASS GRAFTING (CABG) (N/A) INTRAOPERATIVE TRANSESOPHAGEAL ECHOCARDIOGRAM (N/A)  Subjective: Just finished working with PT.  Had thoracentesis earlier, breathing comfortably.   Objective: Vital signs in last 24 hours: Patient Vitals for the past 24 hrs:  BP Temp Temp src Pulse Resp SpO2 Weight  04/18/13 0920 130/69 mmHg - - 72 - 96 % -  04/18/13 0915 - - - - - 96 % -  04/18/13 0428 146/78 mmHg 98.6 F (37 C) Oral 68 18 94 % -  04/18/13 0426 - - - - - - 206 lb 2.1 oz (93.5 kg)  04/17/13 2119 - - - - - 96 % -  04/17/13 2049 140/53 mmHg 98.3 F (36.8 C) Oral 64 19 96 % -  04/17/13 1500 118/60 mmHg - - 64 - - -  04/17/13 1405 96/52 mmHg 97.7 F (36.5 C) Oral 60 19 100 % -   Current Weight  04/18/13 206 lb 2.1 oz (93.5 kg)  PRE-OPERATIVE WEIGHT: 97.8kg    Intake/Output from previous day: 03/15 0701 - 03/16 0700 In: 300 [P.O.:300] Out: 1050 [Urine:1050]  CBGs 126-84-80    PHYSICAL EXAM:  Heart: RRR Lungs: Decreased BS in bases Wound: Clean and dry Extremities: No significant LE edema    Lab Results: CBC:No results found for this basename: WBC, HGB, HCT, PLT,  in the last 72 hours BMET:  Recent Labs  04/16/13 0513 04/16/13 1725  NA 143 143  K 4.2 4.3  CL 101 101  CO2 28 24  GLUCOSE 132* 135*  BUN 19 20  CREATININE 1.04 1.01  CALCIUM 9.4 9.5    PT/INR: No results found for this basename: LABPROT, INR,  in the last 72 hours  CXR: FINDINGS:  Cardiac shadow remains enlarged. Postsurgical changes are again  seen. Bibasilar atelectatic changes are noted. There has been  right-sided thoracentesis with resolution of the right pleural  effusion. No pneumothorax is noted. No acute bony abnormality is  seen.  IMPRESSION:  No evidence of pneumothorax following thoracentesis.   Assessment/Plan: S/P  Procedure(s) (LRB): CORONARY ARTERY BYPASS GRAFTING (CABG) (N/A) INTRAOPERATIVE TRANSESOPHAGEAL ECHOCARDIOGRAM (N/A) CV- SR, BPs stable. Continue Lopressor, Norvasc, Clonidine, Lisinopril.  Pulm/R effusion- 1 liter drained from thoracentesis. Continue to wean O2 as tolerated. Vol overload- Continue po Lasix.  DM- A1C=8.6 Sugars stable. Continue Levemir, Glucotrol, Metformin.  Continue PT, CRPI.  Disp- Family will not be able to provide 24 hour assistance, so awaiting SNF placement.   LOS: 14 days    COLLINS,GINA H 04/18/2013  Feels better after thoracentesis Waiting for SNF  patient examined and medical record reviewed,agree with above note. [name],Gelsey Amyx 04/18/2013

## 2013-04-18 NOTE — Procedures (Signed)
US guided therapeutic right thoracentesis performed yielding 1 liter bloody fluid. F/u CXR pending. No immediate complications.

## 2013-04-18 NOTE — Progress Notes (Signed)
Physical Therapy Treatment Patient Details Name: Leonard [name] MRN: [id number] DOB: 1950/05/14 Today's Date: 04/18/2013 Time: [id number] PT Time Calculation (min): 13 min  PT Assessment / Plan / Recommendation  History of Present Illness pt presented with acute pulmonary edema and volume overload, who is now s/p CABG.     PT Comments   Anticipate pt may be able to d/c home without RW.  Will plan to further assess next visit.  Follow Up Recommendations  Home health PT     Does the patient have the potential to tolerate intense rehabilitation     Barriers to Discharge        Equipment Recommendations  Rolling walker with 5" wheels;3in1 (PT) (may not need RW, will further assess)    Recommendations for Other Services OT consult;Rehab consult  Frequency Min 3X/week   Progress towards PT Goals Progress towards PT goals: Progressing toward goals  Plan Current plan remains appropriate    Precautions / Restrictions Precautions Precautions: Sternal;Fall Restrictions Weight Bearing Restrictions: Yes (sternal precautions) Other Position/Activity Restrictions: sternal precautions   Pertinent Vitals/Pain See note below, O2 sats 94-98% on RA with ambulation 75'    Mobility  Bed Mobility Overal bed mobility: Modified Independent Bed Mobility: Sidelying to Sit Sidelying to sit: Supervision Transfers Overall transfer level: Modified independent Equipment used: Rolling walker (2 wheeled) Transfers: Sit to/from Stand Sit to Stand: Supervision General transfer comment: VCs for sternal precautions and technique Ambulation/Gait Ambulation/Gait assistance: Supervision Ambulation Distance (Feet): 75 Feet Assistive device: Rolling walker (2 wheeled) Gait Pattern/deviations: WFL(Within Functional Limits) Gait velocity interpretation: Below normal speed for age/gender General Gait Details: pt. ambulated 73' without O2, monitored O2 sats with sats > 93% with amb.  Amb 5' without RW to w/c  for transport to procedure.  No significant unsteadiness or balance deficits noted.  Will further assess next visit.    Exercises     PT Diagnosis:    PT Problem List:   PT Treatment Interventions:     PT Goals (current goals can now be found in the care plan section) Acute Rehab PT Goals Patient Stated Goal: to go home PT Goal Formulation: With patient Time For Goal Achievement: 04/26/13 Potential to Achieve Goals: Good  Visit Information  Last PT Received On: 04/18/13 Assistance Needed: +1 History of Present Illness: pt presented with acute pulmonary edema and volume overload, who is now s/p CABG.      Subjective Data  Subjective: "I feel good." Patient Stated Goal: to go home   Cognition  Cognition Arousal/Alertness: Awake/alert Behavior During Therapy: WFL for tasks assessed/performed Overall Cognitive Status: Within Functional Limits for tasks assessed Area of Impairment: Safety/judgement;Awareness Current Attention Level: Selective Following Commands: Follows one step commands consistently Safety/Judgement: Decreased awareness of safety Awareness: Emergent    Balance  Balance Overall balance assessment: No apparent balance deficits (not formally assessed) General Comments General comments (skin integrity, edema, etc.): O2 at rest on RA: 98%; after amb 75': 94% on RA  End of Session PT - End of Session Equipment Utilized During Treatment: Gait belt Activity Tolerance: Patient tolerated treatment well Patient left: Other (comment);in chair (w/c for transport to procedure) Nurse Communication: Mobility status   GP     Faustino Congress K 04/18/2013, 10:40 AM  Laureen Abrahams, PT, DPT [phone]

## 2013-04-18 NOTE — Progress Notes (Signed)
Occupational Therapy Treatment Patient Details Name: Leonard [name] MRN: [id number] DOB: 10/29/1950 Today's Date: 04/18/2013 Time: [id number] OT Time Calculation (min): 21 min  OT Assessment / Plan / Recommendation  History of present illness pt presented with acute pulmonary edema and volume overload, who is now s/p CABG.     OT comments  Pt overall requires supervision for BADLs.  He is a bit impulsive.  He is able to state sternal precautions, but requires mod verbal cues to adhere to them.   Follow Up Recommendations  Home health OT;Supervision/Assistance - 24 hour    Barriers to Discharge       Equipment Recommendations  None recommended by OT    Recommendations for Other Services    Frequency Min 2X/week   Progress towards OT Goals Progress towards OT goals: Progressing toward goals  Plan Discharge plan remains appropriate    Precautions / Restrictions Precautions Precautions: Sternal;Fall Precaution Comments: Pt able to state sternal precautions, but needs mod verbal cues to adhere to them. Restrictions Weight Bearing Restrictions: Yes Other Position/Activity Restrictions: sternal precautions   Pertinent Vitals/Pain     ADL  Grooming: Wash/dry hands;Wash/dry face;Supervision/safety Where Assessed - Grooming: Unsupported standing Lower Body Bathing: Supervision/safety Where Assessed - Lower Body Bathing: Unsupported sit to stand Lower Body Dressing: Supervision/safety Where Assessed - Lower Body Dressing: Unsupported sit to stand Toilet Transfer: Supervision/safety Toilet Transfer Method: Sit to stand;Stand pivot Science writer: Comfort height toilet Toileting - Clothing Manipulation and Hygiene: Modified independent Where Assessed - Toileting Clothing Manipulation and Hygiene: Sit to stand from 3-in-1 or toilet Transfers/Ambulation Related to ADLs: Supervision ADL Comments: Pt requires mod verbal cues to adhere to sternal precautions.      OT  Diagnosis:    OT Problem List:   OT Treatment Interventions:     OT Goals(current goals can now be found in the care plan section) Acute Rehab OT Goals Time For Goal Achievement: 04/21/13 Potential to Achieve Goals: Good ADL Goals Pt Will Perform Grooming: standing;with modified independence Pt Will Perform Lower Body Bathing: with modified independence;sit to/from stand Pt Will Perform Lower Body Dressing: with modified independence;sit to/from stand Pt Will Transfer to Toilet: with modified independence;ambulating Pt Will Perform Toileting - Clothing Manipulation and hygiene: with modified independence;sit to/from stand Pt Will Perform Tub/Shower Transfer: Tub transfer;with supervision;ambulating;shower seat;rolling walker Additional ADL Goal #1: Pt will adhere to sternal precautions during ADL independently. Additional ADL Goal #2: Pt will gather items for ADL around room with RW independently.  Visit Information  Last OT Received On: 04/18/13 Assistance Needed: +1 History of Present Illness: pt presented with acute pulmonary edema and volume overload, who is now s/p CABG.      Subjective Data      Prior Functioning       Cognition  Cognition Arousal/Alertness: Awake/alert Behavior During Therapy: WFL for tasks assessed/performed Overall Cognitive Status: Within Functional Limits for tasks assessed Area of Impairment: Safety/judgement;Awareness Safety/Judgement: Decreased awareness of safety    Mobility  Bed Mobility Overal bed mobility: Modified Independent Transfers Overall transfer level: Needs assistance Equipment used: Rolling walker (2 wheeled) Transfers: Sit to/from [facility] Sit to Stand: Supervision Stand pivot transfers: Supervision    Exercises      Balance    End of Session OT - End of Session Equipment Utilized During Treatment: Oxygen;Rolling walker Activity Tolerance: Patient tolerated treatment well Patient left: in bed;with  call bell/phone within reach Nurse Communication: Mobility status  GO     Leonard [name],  Ellard Artis M 04/18/2013, 2:31 PM

## 2013-04-18 NOTE — Progress Notes (Signed)
Oral meds for diabetes held at this time; pt c/o pain and not wanting to eat; oral pain meds given; will cont. To monitor.

## 2013-04-19 ENCOUNTER — Inpatient Hospital Stay (HOSPITAL_COMMUNITY): Payer: Medicaid Other

## 2013-04-19 LAB — GLUCOSE, CAPILLARY
GLUCOSE-CAPILLARY: 102 mg/dL — AB (ref 70–99)
GLUCOSE-CAPILLARY: 148 mg/dL — AB (ref 70–99)
Glucose-Capillary: 109 mg/dL — ABNORMAL HIGH (ref 70–99)
Glucose-Capillary: 121 mg/dL — ABNORMAL HIGH (ref 70–99)

## 2013-04-19 NOTE — Progress Notes (Addendum)
Update: CSW received phone call from patient's sister [name]. CSW explained that beds are very limited due to patient's insurance and the closest bed available is in [place]. Patient's sister explained she did not want patient to go all the way out there because "it was unfamiliar." Patient's sister asked if Case Manager could follow up with sister regarding HH. CM was notified. CSW then received a phone call from patient's sister [name], stating "she was told by MD that patient would get 2 weeks of PT." CSW explained that patient's insurance does not cover PT and the only facility that has a Medicaid bed available is in [facility]. Patient's sister appeared to be upset and states she will call her sister [name] to make a plan.  CSW provided patient with only bed offer at this time, [facility]. Patient reluctantly agreed stating" if it's the only option, I guess I have no choice." Patient asked social worker to call his sister and explain the situation. FL2 on chart.  Jeanette Caprice, MSW, [place]

## 2013-04-19 NOTE — Progress Notes (Signed)
CARDIAC REHAB PHASE I   PRE:  Rate/Rhythm: 65 SR  BP:  Supine:   Sitting:   Standing:    SaO2: 96%RA  Met me at door  MODE:  Ambulation: 800 ft   POST:  Rate/Rhythm: 78 SR  BP:  Supine:   Sitting: 150/70  Standing:    SaO2: 89-97%RA during walk 1418-1515 Pt walked 800 ft on RA with hand held asst . Tolerated well on RA. Able to talk during walk. Sats above 88%RA. Education on activity, sternal precautions, restrictions done with pt who voiced understanding. Pt states he is done with smoking but does want to continue with patches. Gave smoking cessation handouts. Pt had questions re diabetic diet. Brief ed done on carb counting and gave diabetic diet. Discussed CRP 2 but pt states no transportation. Will re enforce ed tomorrow.    , RN BSN  04/19/2013 3:11 PM    

## 2013-04-19 NOTE — Progress Notes (Addendum)
       [address]       [place] [address]             [phone]          12 Days Post-Op Procedure(s) (LRB): CORONARY ARTERY BYPASS GRAFTING (CABG) (N/A) INTRAOPERATIVE TRANSESOPHAGEAL ECHOCARDIOGRAM (N/A)  Subjective: Comfortable, no complaints.   Objective: Vital signs in last 24 hours: Patient Vitals for the past 24 hrs:  BP Temp Temp src Pulse Resp SpO2 Weight  04/19/13 0459 - - - - - - 203 lb 0.7 oz (92.1 kg)  04/19/13 0455 117/54 mmHg 98.2 F (36.8 C) Oral 62 17 99 % -  04/18/13 2012 110/57 mmHg 98.4 F (36.9 C) Oral 69 17 97 % -  04/18/13 2005 - - - - - 94 % -  04/18/13 1304 105/63 mmHg 97.9 F (36.6 C) Oral 66 16 96 % -  04/18/13 1152 122/70 mmHg - - - - - -  04/18/13 1136 126/65 mmHg - - - - - -  04/18/13 0920 130/69 mmHg - - 72 - 96 % -  04/18/13 0915 - - - - - 96 % -   Current Weight  04/19/13 203 lb 0.7 oz (92.1 kg)     Intake/Output from previous day: 03/16 0701 - 03/17 0700 In: 360 [P.O.:360] Out: 725 [Urine:725]  CBGs [id number]    PHYSICAL EXAM:  Heart: RRR Lungs: Clear, slightly decreased BS in bases Wound: Clean and dry Extremities: No edema    Lab Results: CBC:No results found for this basename: WBC, HGB, HCT, PLT,  in the last 72 hours BMET:  Recent Labs  04/16/13 1725  NA 143  K 4.3  CL 101  CO2 24  GLUCOSE 135*  BUN 20  CREATININE 1.01  CALCIUM 9.5    PT/INR: No results found for this basename: LABPROT, INR,  in the last 72 hours  CXR: Small residual right pleural effusion   Assessment/Plan: S/P Procedure(s) (LRB): CORONARY ARTERY BYPASS GRAFTING (CABG) (N/A) INTRAOPERATIVE TRANSESOPHAGEAL ECHOCARDIOGRAM (N/A)  CV- SR, BPs stable. Continue Lopressor, Norvasc, Clonidine, Lisinopril.   Pulm/R effusion- Stable after thoracentesis. Continue to wean O2 as tolerated.   Vol overload- Continue po Lasix.   DM- A1C=8.6 Sugars stable. Continue Levemir, Glucotrol, Metformin.   Continue PT,  CRPI.   Disp-Awaiting SNF placement when bed available.    LOS: 15 days    COLLINS,GINA H 04/19/2013  patient examined and medical record reviewed,agree with above note. [name],PETER 04/20/2013

## 2013-04-19 NOTE — Progress Notes (Signed)
Physical Therapy Treatment Patient Details Name: Leonard [name] MRN: [id number] DOB: Nov 03, 1950 Today's Date: 04/19/2013 Time: [id number] PT Time Calculation (min): 24 min  PT Assessment / Plan / Recommendation  History of Present Illness pt presented with acute pulmonary edema and volume overload, who is now s/p CABG.     PT Comments   Improved each session.  Hints of impulsivity show up on occasion, but aren't like to pose major problems to safety.  Gait is generally safe and steady without assistive device.  Saturations on RA fluctuate, but generally stay in the 90's  See below.  Pt could d/c home if he had someone to be with him for a good portion of the day until he is able to definitely be alone for long periods.  I don't think someone has to be there 24/7 just the majority of the time.  Follow Up Recommendations  Home health PT     Does the patient have the potential to tolerate intense rehabilitation     Barriers to Discharge        Equipment Recommendations  None recommended by PT    Recommendations for Other Services    Frequency Min 3X/week   Progress towards PT Goals Progress towards PT goals: Progressing toward goals  Plan Current plan remains appropriate    Precautions / Restrictions Precautions Precautions: Sternal;Fall Precaution Comments: Pt able to state sternal precautions, but needs mod verbal cues to adhere to them. Restrictions Weight Bearing Restrictions: Yes (sternal precautions) Other Position/Activity Restrictions: sternal precautions   Pertinent Vitals/Pain Sats/HR--at rest 95%  62 bpm,  Walking #1 down to 84% EHR 80's, O2 recharge on 2L sats 95% HR 70's,  Walk #2  sats 92% on RA  HR 70's,  Back to room on RA at sats 94%  HR 94.  Left with RN's knowledge on RA.     Mobility  Bed Mobility Overal bed mobility: Modified Independent General bed mobility comments: Pt used momentum spontaneously to perform supine to sit Transfers Transfers: Sit to/from  Stand Sit to Stand: Supervision General transfer comment: VCs for sternal precautions and technique Ambulation/Gait Ambulation/Gait assistance: Modified independent (Device/Increase time) Ambulation Distance (Feet): 700 Feet Assistive device: None Gait Pattern/deviations: WFL(Within Functional Limits) General Gait Details: generally steady and wfl.  One episode of self-recovered unsteadiness.  See DGI    Exercises     PT Diagnosis:    PT Problem List:   PT Treatment Interventions:     PT Goals (current goals can now be found in the care plan section) Acute Rehab PT Goals Patient Stated Goal: to go home PT Goal Formulation: With patient Time For Goal Achievement: 04/26/13 Potential to Achieve Goals: Good  Visit Information  Last PT Received On: 04/19/13 History of Present Illness: pt presented with acute pulmonary edema and volume overload, who is now s/p CABG.      Subjective Data  Subjective: "I feel good." Patient Stated Goal: to go home   Cognition  Cognition Arousal/Alertness: Awake/alert Behavior During Therapy: WFL for tasks assessed/performed Overall Cognitive Status: Within Functional Limits for tasks assessed Area of Impairment: Safety/judgement;Awareness    Balance  Balance Overall balance assessment: Needs assistance Sitting-balance support: Feet supported Sitting balance-Leahy Scale: Good Standing balance support: No upper extremity supported;During functional activity Standing balance-Leahy Scale: Good  End of Session PT - End of Session Equipment Utilized During Treatment: Oxygen Activity Tolerance: Patient tolerated treatment well Patient left: in chair;with call bell/phone within reach Nurse Communication: Mobility status  GP     [name], Tessie Fass 04/19/2013, 11:01 AM  04/19/2013  Donnella Sham, [place] [phone]  (pager)

## 2013-04-20 LAB — GLUCOSE, CAPILLARY
Glucose-Capillary: 97 mg/dL (ref 70–99)
Glucose-Capillary: 139 mg/dL — ABNORMAL HIGH (ref 70–99)

## 2013-04-20 MED ORDER — FUROSEMIDE 40 MG PO TABS: 40.0000 mg | ORAL_TABLET | Freq: Every day | ORAL | Status: DC

## 2013-04-20 MED ORDER — METOPROLOL TARTRATE 25 MG PO TABS: 25.0000 mg | ORAL_TABLET | Freq: Two times a day (BID) | ORAL | Status: DC

## 2013-04-20 MED ORDER — OXYCODONE HCL 5 MG PO TABS: 5.0000 mg | ORAL_TABLET | ORAL | Status: DC | PRN

## 2013-04-20 MED ORDER — POTASSIUM CHLORIDE CRYS ER 20 MEQ PO TBCR: 20.0000 meq | EXTENDED_RELEASE_TABLET | Freq: Every day | ORAL | Status: DC

## 2013-04-20 MED ORDER — ASPIRIN 325 MG PO TBEC: 325.0000 mg | DELAYED_RELEASE_TABLET | Freq: Every day | ORAL | Status: DC

## 2013-04-20 MED ORDER — BUDESONIDE-FORMOTEROL FUMARATE 160-4.5 MCG/ACT IN AERO: 2.0000 | INHALATION_SPRAY | Freq: Two times a day (BID) | RESPIRATORY_TRACT | Status: DC

## 2013-04-20 NOTE — Progress Notes (Signed)
       [address]       [place] [address]             [phone]          13 Days Post-Op Procedure(s) (LRB): CORONARY ARTERY BYPASS GRAFTING (CABG) (N/A) INTRAOPERATIVE TRANSESOPHAGEAL ECHOCARDIOGRAM (N/A)  Subjective: Stable, no new issues.  Breathing stable.   Objective: Vital signs in last 24 hours: Patient Vitals for the past 24 hrs:  BP Temp Temp src Pulse Resp SpO2 Weight  04/20/13 0505 123/57 mmHg 98 F (36.7 C) Oral 71 16 98 % 200 lb 8 oz (90.946 kg)  04/19/13 2201 114/79 mmHg 98 F (36.7 C) Oral 68 18 96 % -  04/19/13 2101 - - - - - 97 % -  04/19/13 1404 115/56 mmHg 98.1 F (36.7 C) Oral 63 18 97 % -   Current Weight  04/20/13 200 lb 8 oz (90.946 kg)     Intake/Output from previous day: 03/17 0701 - 03/18 0700 In: 600 [P.O.:600] Out: -   CBGs 121-97    PHYSICAL EXAM:  Heart: RRR Lungs: Clear Wound: Clean and dry Extremities: No significant LE edema    Lab Results: CBC:No results found for this basename: WBC, HGB, HCT, PLT,  in the last 72 hours BMET: No results found for this basename: NA, K, CL, CO2, GLUCOSE, BUN, CREATININE, CALCIUM,  in the last 72 hours  PT/INR: No results found for this basename: LABPROT, INR,  in the last 72 hours    Assessment/Plan: S/P Procedure(s) (LRB): CORONARY ARTERY BYPASS GRAFTING (CABG) (N/A) INTRAOPERATIVE TRANSESOPHAGEAL ECHOCARDIOGRAM (N/A) Medically stable and ready for discharge to SNF.  Pt has bed available, will plan d/c today if pt and family agreeable.   LOS: 16 days    Peityn Payton H 04/20/2013

## 2013-04-20 NOTE — Progress Notes (Signed)
Occupational Therapy Treatment Patient Details Name: Leonard [name] MRN: [id number] DOB: 08-May-1950 Today's Date: 04/20/2013 Time: 1008-1040 OT Time Calculation (min): 32 min  OT Assessment / Plan / Recommendation  History of present illness pt presented with acute pulmonary edema and volume overload, who is now s/p CABG.     OT comments  Pt is performing self care and mobility at a modified independent level.  I am concerned about pt's difficulty generalizing sternal precautions at home during IADL.  Pt is trying to get sister to agree to supervising him at home upon discharge initially.   Follow Up Recommendations  Home health OT;Supervision/Assistance - 24 hour    Barriers to Discharge       Equipment Recommendations  None recommended by OT    Recommendations for Other Services    Frequency Min 2X/week   Progress towards OT Goals Progress towards OT goals: Progressing toward goals  Plan Discharge plan remains appropriate    Precautions / Restrictions Precautions Precautions: Sternal;Fall Precaution Comments: Pt able to state sternal precautions, but needs mod verbal cues to adhere to them. Restrictions Weight Bearing Restrictions: No Other Position/Activity Restrictions: sternal precautions   Pertinent Vitals/Pain VSS, no c/o pain    ADL  Grooming: Wash/dry hands;Teeth care;Wash/dry face;Modified independent Where Assessed - Grooming: Unsupported standing Lower Body Bathing: Modified independent Where Assessed - Lower Body Bathing: Unsupported sitting;Supported standing Lower Body Dressing: Modified independent Where Assessed - Lower Body Dressing: Unsupported sitting;Supported standing Toilet Transfer: Modified independent Toilet Transfer Method: Sit to Loss adjuster, chartered: Comfort height toilet Toileting - Clothing Manipulation and Hygiene: Modified independent Where Assessed - Best boy and Hygiene: Sit to stand from 3-in-1 or  toilet Equipment Used: Gait belt;Rolling walker Transfers/Ambulation Related to ADLs: mod I ADL Comments: Pt continues to need verbal cues to generalize sternal precautions.  Discussed IADL related to sternal precaution in the event pt discharges home.    OT Diagnosis:    OT Problem List:   OT Treatment Interventions:     OT Goals(current goals can now be found in the care plan section) Acute Rehab OT Goals Patient Stated Goal: to go home  Visit Information  Last OT Received On: 04/20/13 Assistance Needed: +1 History of Present Illness: pt presented with acute pulmonary edema and volume overload, who is now s/p CABG.      Subjective Data      Prior Functioning       Cognition  Cognition Arousal/Alertness: Awake/alert Behavior During Therapy: WFL for tasks assessed/performed Overall Cognitive Status: Within Functional Limits for tasks assessed    Mobility  Bed Mobility Overal bed mobility: Modified Independent Bed Mobility: Supine to Sit;Sit to Supine Supine to sit: Modified independent (Device/Increase time) Sit to supine: Modified independent (Device/Increase time) Transfers Equipment used: Rolling walker (2 wheeled) Transfers: Sit to/from Stand Sit to Stand: Modified independent (Device/Increase time)    Exercises      Balance    End of Session OT - End of Session Activity Tolerance: Patient tolerated treatment well Patient left: in bed;with call bell/phone within reach  GO     Malka So 04/20/2013, 10:47 AM

## 2013-04-20 NOTE — Progress Notes (Signed)
[id number] Cardiac Rehab Reviewed discharge education with pt. We discussed Outpt. CRP. He does not drive and is not sure whether he will have transportation three days a week. He agreed for me to send a referral to [facility] program so that we could follow up with him. He voices understanding. Put discharge surgery video on for him to watch. Deon Pilling, RN 04/20/2013 12:28 PM

## 2013-04-20 NOTE — Progress Notes (Signed)
Per patient, he does not want to go to snf and stay for 30 days in [facility] and states his friend is going to come stay with him intermittently. MD is aware and agreeable to home health. CSW signing off at this time. CM will continue dc to home. Please re consult if social work needs arise.  Jeanette Caprice, MSW, [place]

## 2013-04-21 ENCOUNTER — Ambulatory Visit: Payer: Medicaid Other | Admitting: Internal Medicine

## 2013-04-21 NOTE — Discharge Summary (Signed)
patient examined and medical record reviewed,agree with above note. [name],Leonard [name] 04/21/2013

## 2013-04-22 ENCOUNTER — Telehealth: Payer: Self-pay | Admitting: Family Medicine

## 2013-04-22 NOTE — Telephone Encounter (Signed)
Pt called and needs all his prescriptions sent to [facility] and [address]. He is not using [facility] anymore and they will not transfer his prescriptions for him.jw

## 2013-04-25 ENCOUNTER — Telehealth: Payer: Self-pay

## 2013-04-25 ENCOUNTER — Encounter: Payer: Self-pay | Admitting: Vascular Surgery

## 2013-04-25 NOTE — Telephone Encounter (Signed)
Received phone call from pt's [facility] RN from [facility].  Reports has a small open wound measuring approx. 1 cm. X 2 cm. with 1 cm. Depth.  States there is drainage and slough in the wound.  Reports no fever/ chills.  Advised pt. Will be contacted to schedule f/u appt. with Dr. Donnetta Hutching.

## 2013-04-26 ENCOUNTER — Ambulatory Visit (INDEPENDENT_AMBULATORY_CARE_PROVIDER_SITE_OTHER): Payer: Self-pay | Admitting: Vascular Surgery

## 2013-04-26 ENCOUNTER — Encounter: Payer: Self-pay | Admitting: Vascular Surgery

## 2013-04-26 VITALS — BP 123/64 | HR 67 | Temp 98.3°F | Ht 68.0 in | Wt 207.0 lb

## 2013-04-26 DIAGNOSIS — I739 Peripheral vascular disease, unspecified: Secondary | ICD-10-CM

## 2013-04-26 NOTE — Progress Notes (Signed)
Here today for followup of his open fourth and fifth amputations from 01/28/2013. Since my last visit with him he had urgent coronary bypass grafting. He has done well and was discharged to home.  On physical exam today he has a completely healed his fourth and fifth amputations. He does have some callus formation under his worst metatarsal head this does not appear to be invasive and does not extend into his foot.  Impression and plan: Healed fourth and fifth toe amputations. Will use a regular shoe. We'll keep close eye on this foot notify should he develop any new difficulty. Otherwise he'll be seen an as-needed basis

## 2013-04-30 ENCOUNTER — Telehealth: Payer: Self-pay | Admitting: Family Medicine

## 2013-04-30 NOTE — Telephone Encounter (Signed)
Pt called outside line  R earache. Started yesterday.  Feels it is easing up.  Started in jawbone then traveled Denies any aggrevating symptoms Oxycodone yesterday w/o much benefit.  Denies any recent URI.  Uses Qtip to clean ears  Unsure of etiology but may be due to TMJ vs otisis externa.  NSAIDs (advil 600mg  Q6) Pt to call if no improvement by tomorrow Consider auralgan if no h/o perforation  Linna Darner, MD Family Medicine PGY-3 04/30/2013, 6:23 PM

## 2013-05-01 ENCOUNTER — Emergency Department (HOSPITAL_COMMUNITY)
Admission: EM | Admit: 2013-05-01 | Discharge: 2013-05-01 | Disposition: A | Discharge status: Home / Self Care | Payer: Medicaid Other | Attending: Emergency Medicine | Admitting: Emergency Medicine

## 2013-05-01 ENCOUNTER — Encounter (HOSPITAL_COMMUNITY): Payer: Self-pay | Admitting: Emergency Medicine

## 2013-05-01 DIAGNOSIS — Z79899 Other long term (current) drug therapy: Secondary | ICD-10-CM | POA: Insufficient documentation

## 2013-05-01 DIAGNOSIS — Z794 Long term (current) use of insulin: Secondary | ICD-10-CM | POA: Insufficient documentation

## 2013-05-01 DIAGNOSIS — H748X9 Other specified disorders of middle ear and mastoid, unspecified ear: Secondary | ICD-10-CM | POA: Insufficient documentation

## 2013-05-01 DIAGNOSIS — Z87891 Personal history of nicotine dependence: Secondary | ICD-10-CM | POA: Insufficient documentation

## 2013-05-01 DIAGNOSIS — H9201 Otalgia, right ear: Secondary | ICD-10-CM

## 2013-05-01 DIAGNOSIS — H9209 Otalgia, unspecified ear: Secondary | ICD-10-CM | POA: Insufficient documentation

## 2013-05-01 DIAGNOSIS — Z7982 Long term (current) use of aspirin: Secondary | ICD-10-CM | POA: Insufficient documentation

## 2013-05-01 DIAGNOSIS — I10 Essential (primary) hypertension: Secondary | ICD-10-CM | POA: Insufficient documentation

## 2013-05-01 DIAGNOSIS — Z9861 Coronary angioplasty status: Secondary | ICD-10-CM | POA: Insufficient documentation

## 2013-05-01 DIAGNOSIS — Z951 Presence of aortocoronary bypass graft: Secondary | ICD-10-CM | POA: Insufficient documentation

## 2013-05-01 DIAGNOSIS — K08109 Complete loss of teeth, unspecified cause, unspecified class: Secondary | ICD-10-CM | POA: Insufficient documentation

## 2013-05-01 DIAGNOSIS — E119 Type 2 diabetes mellitus without complications: Secondary | ICD-10-CM | POA: Insufficient documentation

## 2013-05-01 DIAGNOSIS — E7800 Pure hypercholesterolemia, unspecified: Secondary | ICD-10-CM | POA: Insufficient documentation

## 2013-05-01 LAB — RAPID STREP SCREEN (MED CTR MEBANE ONLY): STREPTOCOCCUS, GROUP A SCREEN (DIRECT): NEGATIVE

## 2013-05-01 MED ORDER — ANTIPYRINE-BENZOCAINE 5.4-1.4 % OT SOLN: 3.0000 [drp] | OTIC | Status: DC | PRN

## 2013-05-01 NOTE — ED Provider Notes (Signed)
Medical screening examination/treatment/procedure(s) were conducted as a shared visit with non-physician practitioner(s) and myself.  I personally evaluated the patient during the encounter.   EKG Interpretation None       Orlie Dakin, MD 05/01/13 1606

## 2013-05-01 NOTE — Discharge Instructions (Signed)
Please apply 2-3 drops of ear drop into your right ear every 2 hrs as needed for pain.  Follow up with your doctor for further care.    Otalgia The most common reason for this in children is an infection of the middle ear. Pain from the middle ear is usually caused by a build-up of fluid and pressure behind the eardrum. Pain from an earache can be sharp, dull, or burning. The pain may be temporary or constant. The middle ear is connected to the nasal passages by a short narrow tube called the Eustachian tube. The Eustachian tube allows fluid to drain out of the middle ear, and helps keep the pressure in your ear equalized. CAUSES  A cold or allergy can block the Eustachian tube with inflammation and the build-up of secretions. This is especially likely in small children, because their Eustachian tube is shorter and more horizontal. When the Eustachian tube closes, the normal flow of fluid from the middle ear is stopped. Fluid can accumulate and cause stuffiness, pain, hearing loss, and an ear infection if germs start growing in this area. SYMPTOMS  The symptoms of an ear infection may include fever, ear pain, fussiness, increased crying, and irritability. Many children will have temporary and minor hearing loss during and right after an ear infection. Permanent hearing loss is rare, but the risk increases the more infections a child has. Other causes of ear pain include retained water in the outer ear canal from swimming and bathing. Ear pain in adults is less likely to be from an ear infection. Ear pain may be referred from other locations. Referred pain may be from the joint between your jaw and the skull. It may also come from a tooth problem or problems in the neck. Other causes of ear pain include:  A foreign body in the ear.  Outer ear infection.  Sinus infections.  Impacted ear wax.  Ear injury.  Arthritis of the jaw or TMJ problems.  Middle ear infection.  Tooth infections.  Sore  throat with pain to the ears. DIAGNOSIS  Your caregiver can usually make the diagnosis by examining you. Sometimes other special studies, including x-rays and lab work may be necessary. TREATMENT   If antibiotics were prescribed, use them as directed and finish them even if you or your child's symptoms seem to be improved.  Sometimes PE tubes are needed in children. These are little plastic tubes which are put into the eardrum during a simple surgical procedure. They allow fluid to drain easier and allow the pressure in the middle ear to equalize. This helps relieve the ear pain caused by pressure changes. HOME CARE INSTRUCTIONS   Only take over-the-counter or prescription medicines for pain, discomfort, or fever as directed by your caregiver. DO NOT GIVE CHILDREN ASPIRIN because of the association of Reye's Syndrome in children taking aspirin.  Use a cold pack applied to the outer ear for 15-20 minutes, 03-04 times per day or as needed may reduce pain. Do not apply ice directly to the skin. You may cause frost bite.  Over-the-counter ear drops used as directed may be effective. Your caregiver may sometimes prescribe ear drops.  Resting in an upright position may help reduce pressure in the middle ear and relieve pain.  Ear pain caused by rapidly descending from high altitudes can be relieved by swallowing or chewing gum. Allowing infants to suck on a bottle during airplane travel can help.  Do not smoke in the house or near children.  If you are unable to quit smoking, smoke outside.  Control allergies. SEEK IMMEDIATE MEDICAL CARE IF:   You or your child are becoming sicker.  Pain or fever relief is not obtained with medicine.  You or your child's symptoms (pain, fever, or irritability) do not improve within 24 to 48 hours or as instructed.  Severe pain suddenly stops hurting. This may indicate a ruptured eardrum.  You or your children develop new problems such as severe headaches,  stiff neck, difficulty swallowing, or swelling of the face or around the ear. Document Released: 09/07/2003 Document Revised: 04/14/2011 Document Reviewed: 01/12/2008 [facility] Patient Information 2014 [employer].

## 2013-05-01 NOTE — ED Notes (Signed)
Gave patient a hot pack for his ear for pain relief.

## 2013-05-01 NOTE — ED Notes (Signed)
Onset Friday night right ear pain.  No ear discharge.  Recent CABG and is on Oxycodone, pt took 2 doses yesterday with no relief in ear pain.  Pt does not c/o sore throat.  States when swallows right ear pain.  Pt has had cough since CABG but cough has not worsened since surgery.  No other s/s noted.

## 2013-05-01 NOTE — ED Provider Notes (Signed)
CSN: [id number]     Arrival date & time 05/01/13  1214 History  This chart was scribed for non-physician practitioner, Domenic Moras, PA-C working with Orlie Dakin, MD by Frederich Balding, ED scribe. This patient was seen in room TR05C/TR05C and the patient's care was started at 12:40 PM.   Chief Complaint  Patient presents with  . Otalgia   The history is provided by the patient. No language interpreter was used.   HPI Comments: Leonard [name] is a 63 y.o. male with history of hypertension, high cholesterol, PAD and diabetes who presents to the Emergency Department complaining of gradual onset, intermittent, aching right ear pain that started 2 days ago. Swallowing and certain movements cause the pain. He states the pain only lasts for a few seconds when he gets it. Denies any radiation of pain. Pt states he used a Q-tip the other day for the first time in a while. He has taken his oxycodone at home with little relief. Denies chest pain, trouble breathing, abdominal pain, nausea, emesis, diarrhea, diaphoresis, headache, light headedness, dizziness, weakness, numbness, back pain, neck pain, rhinorrhea, sneezing, cough, sore throat.   Past Medical History  Diagnosis Date  . Hypertension   . PAD (peripheral artery disease)   . Gangrene of toe     right 5th/notes 01/04/2013   . High cholesterol   . Type II diabetes mellitus    Past Surgical History  Procedure Laterality Date  . Throat surgery  1983    polyps  . Angioplasty / stenting femoral Right 01/13/2013    superficial femoral artery x 2 (3 mm x 60 mm, 4 mm x 60 mm)  . Tonsillectomy and adenoidectomy  ~ 1965  . Amputation Right 01/28/2013    Procedure: RAY AMPUTATION RIGHT  4th & 5th TOE;  Surgeon: Rosetta Posner, MD;  [facility];  Service: Vascular;  Laterality: Right;  . Coronary artery bypass graft N/A 04/07/2013    Procedure: CORONARY ARTERY BYPASS GRAFTING (CABG);  Surgeon: Ivin Poot, MD;  Location: [place];  Service: Open  Heart Surgery;  Laterality: N/A;  . Intraoperative transesophageal echocardiogram N/A 04/07/2013    Procedure: INTRAOPERATIVE TRANSESOPHAGEAL ECHOCARDIOGRAM;  Surgeon: Ivin Poot, MD;  Location: [place];  Service: Open Heart Surgery;  Laterality: N/A;   Family History  Problem Relation Age of Onset  . Cancer Mother     lung cancer  . Hypertension Mother   . Cancer Sister     patient thinks it was uterine cancer  . Hypertension Sister    History  Substance Use Topics  . Smoking status: Former Smoker -- 1.00 packs/day for 44 years    Types: Cigarettes  . Smokeless tobacco: Never Used  . Alcohol Use: No    Review of Systems  Constitutional: Negative for diaphoresis.  HENT: Positive for ear pain. Negative for rhinorrhea, sneezing and sore throat.   Respiratory: Negative for cough.   Cardiovascular: Negative for chest pain.  Gastrointestinal: Negative for nausea, vomiting, abdominal pain and diarrhea.  Musculoskeletal: Negative for back pain and neck pain.  Neurological: Negative for dizziness, weakness, light-headedness, numbness and headaches.  All other systems reviewed and are negative.   Allergies  Review of patient's allergies indicates no known allergies.  Home Medications   Current Outpatient Rx  Name  Route  Sig  Dispense  Refill  . amLODipine (NORVASC) 10 MG tablet   Oral   Take 1 tablet (10 mg total) by mouth daily.   [place]  tablet   1   . aspirin 325 MG EC tablet   Oral   Take 1 tablet (325 mg total) by mouth daily.   30 tablet   0   . Blood Glucose Monitoring Suppl (ACCU-CHEK AVIVA) device      Use as instructed   1 each   0   . budesonide-formoterol (SYMBICORT) 160-4.5 MCG/ACT inhaler   Inhalation   Inhale 2 puffs into the lungs 2 (two) times daily.   1 Inhaler   12   . cloNIDine (CATAPRES) 0.2 MG tablet   Oral   Take 1 tablet (0.2 mg total) by mouth 2 (two) times daily.   90 tablet   1   . furosemide (LASIX) 40 MG tablet   Oral   Take 1  tablet (40 mg total) by mouth daily.   30 tablet   3   . glipiZIDE (GLUCOTROL) 10 MG tablet   Oral   Take 1 tablet (10 mg total) by mouth 2 (two) times daily before a meal.   90 tablet   1   . glucose blood (ACCU-CHEK AVIVA) test strip      Use as instructed   100 each   12   . insulin glargine (LANTUS) 100 units/mL SOLN   Subcutaneous   Inject 30 Units into the skin 2 (two) times daily.   10 mL   0   . Lancet Devices (ACCU-CHEK SOFTCLIX) lancets      Use as instructed to test 4 times a day.  Dx code 250.00.  May change to fit patients meter   100 each   12   . Lancets (ACCU-CHEK SOFT TOUCH) lancets      Use as instructed   100 each   12     May change to preferred by patient's insurance.  T ...   . lisinopril (PRINIVIL,ZESTRIL) 40 MG tablet   Oral   Take 1 tablet (40 mg total) by mouth daily.   30 tablet   3   . metFORMIN (GLUCOPHAGE) 1000 MG tablet   Oral   Take 1 tablet (1,000 mg total) by mouth 2 (two) times daily with a meal.   60 tablet   6   . metoprolol tartrate (LOPRESSOR) 25 MG tablet   Oral   Take 1 tablet (25 mg total) by mouth 2 (two) times daily.   60 tablet   3   . nicotine (NICODERM CQ - DOSED IN MG/24 HR) 7 mg/24hr patch   Transdermal   Place 1 patch (7 mg total) onto the skin daily.   28 patch   0   . oxyCODONE (OXY IR/ROXICODONE) 5 MG immediate release tablet   Oral   Take 1-2 tablets (5-10 mg total) by mouth every 3 (three) hours as needed for moderate pain.   30 tablet   0   . potassium chloride SA (K-DUR,KLOR-CON) 20 MEQ tablet   Oral   Take 1 tablet (20 mEq total) by mouth daily.   30 tablet   3   . pravastatin (PRAVACHOL) 40 MG tablet   Oral   Take 1 tablet (40 mg total) by mouth daily.   90 tablet   1    BP 149/76  Pulse 71  Temp(Src) 98.6 F (37 C) (Oral)  Resp 20  Ht 5\' 8"  (1.727 m)  Wt 212 lb (96.163 kg)  BMI 32.24 kg/m2  SpO2 100%  Physical Exam  Nursing note and vitals reviewed. Constitutional: He  is  oriented to person, place, and time. He appears well-developed and well-nourished. No distress.  HENT:  Head: Normocephalic and atraumatic.  Right Ear: Tympanic membrane is not erythematous and not bulging. A middle ear effusion is present.  Left Ear: Tympanic membrane is not erythematous and not bulging. A middle ear effusion is present.  Mouth/Throat: Uvula is midline and oropharynx is clear and moist. No trismus in the jaw. No oropharyngeal exudate, posterior oropharyngeal edema or posterior oropharyngeal erythema.  Mostly edentulous. Bilateral dull TM without any signs of infection. No earlobe tenderness.   Eyes: EOM are normal.  Neck: Neck supple. No tracheal deviation present.  Mild tenderness noted to the right side of neck along ramus without any significant lymphadenopathy. Neck with full ROM.   Cardiovascular: Normal rate, regular rhythm and normal heart sounds.  Exam reveals no gallop and no friction rub.   No murmur heard. Pulmonary/Chest: Effort normal and breath sounds normal. No respiratory distress. He has no wheezes. He has no rales.  Abdominal: Soft. There is no tenderness.  Musculoskeletal: Normal range of motion.  Neurological: He is alert and oriented to person, place, and time.  Skin: Skin is warm and dry.  Psychiatric: He has a normal mood and affect. His behavior is normal.    ED Course  Procedures (including critical care time)  DIAGNOSTIC STUDIES: Oxygen Saturation is 100% on RA, normal by my interpretation.    COORDINATION OF CARE: 12:47 PM-Discussed treatment plan which includes speaking with the attending with pt at bedside and pt agreed to plan.   2:21 PM Patient with pain to his right ear. No obvious signs of infection. Although he has history of diabetes and significant history of cardiac disease this is likely not related to cardiac equivalent pain. I discussed this with Dr. Winfred Leeds will evaluate patient and agrees. We'll provide auralgan ear drops  and have patient followup with PCP for further care. No evidence of mastoiditis. No signs concerning for peritonsillar abscess or strep throat. Strep test is negative.  Labs Review Labs Reviewed - No data to display Imaging Review No results found.   EKG Interpretation None      MDM   Final diagnoses:  Right ear pain    BP 149/76  Pulse 71  Temp(Src) 98.6 F (37 C) (Oral)  Resp 20  Ht 5\' 8"  (1.727 m)  Wt 212 lb (96.163 kg)  BMI 32.24 kg/m2  SpO2 100%   I personally performed the services described in this documentation, which was scribed in my presence. The recorded information has been reviewed and is accurate.    Domenic Moras, PA-C 05/01/13 1423

## 2013-05-01 NOTE — ED Provider Notes (Signed)
Patient complains of right ear pain since yesterday pain is worse with swallowing. He denies chest pain denies shortness of breath. Pain does not feel like angina equivalent he suffered from in the past which resulted in bypass surgery which manifested itself as shortness of breath. On exam he is in no distress HEENT exam no facial asymmetry bilateral tympanic membranes normal oral pharynx is reddened neck supple trachea midline no bruit lungs clear auscultation heart regular in rhythm abdomen nontender.  Orlie Dakin, MD 05/01/13 [phone]

## 2013-05-01 NOTE — ED Notes (Signed)
Pt in c/o right earache and pain with swallowing, unsure of fever at home, no distress noted

## 2013-05-02 NOTE — Telephone Encounter (Signed)
[facility].pharmacy was updated.Jelene Albano, [name]

## 2013-05-03 LAB — CULTURE, GROUP A STREP

## 2013-05-04 ENCOUNTER — Encounter: Payer: Self-pay | Admitting: Cardiovascular Disease

## 2013-05-04 ENCOUNTER — Ambulatory Visit (INDEPENDENT_AMBULATORY_CARE_PROVIDER_SITE_OTHER): Payer: Medicaid Other | Admitting: Cardiovascular Disease

## 2013-05-04 ENCOUNTER — Ambulatory Visit (INDEPENDENT_AMBULATORY_CARE_PROVIDER_SITE_OTHER)
Admission: RE | Admit: 2013-05-04 | Discharge: 2013-05-04 | Disposition: A | Discharge status: Home / Self Care | Payer: Medicaid Other | Source: Ambulatory Visit | Attending: Cardiovascular Disease | Admitting: Cardiovascular Disease

## 2013-05-04 ENCOUNTER — Telehealth: Payer: Self-pay | Admitting: Cardiovascular Disease

## 2013-05-04 VITALS — BP 180/88 | HR 84 | Ht 68.0 in | Wt 214.0 lb

## 2013-05-04 DIAGNOSIS — I2510 Atherosclerotic heart disease of native coronary artery without angina pectoris: Secondary | ICD-10-CM

## 2013-05-04 DIAGNOSIS — J90 Pleural effusion, not elsewhere classified: Secondary | ICD-10-CM

## 2013-05-04 DIAGNOSIS — E782 Mixed hyperlipidemia: Secondary | ICD-10-CM

## 2013-05-04 DIAGNOSIS — I10 Essential (primary) hypertension: Secondary | ICD-10-CM

## 2013-05-04 NOTE — Patient Instructions (Signed)
Your physician wants you to follow-up in:  6 MONTHS WITH DR NISHAN  You will receive a reminder letter in the mail two months in advance. If you don't receive a letter, please call our office to schedule the follow-up appointment. Your physician recommends that you continue on your current medications as directed. Please refer to the Current Medication list given to you today. 

## 2013-05-04 NOTE — Progress Notes (Signed)
Patient ID: Leonard [name], male   DOB: 1950/07/17, 63 y.o.   MRN: [id number] 63 yo post hospital f/u for CHF and CABG  History of HTN elevated lipis previous heavy smoker admitted with sudden onset dyspnea. Went to bed feeling fine. Awoke early am with severe dyspnea. EMS noted rales Rx with diuretic and CPAP CXR with pulmonary edema CE and kerley B lines. No chest pain or history of CAD. Does have PVD with amputation of 4th/5th toe on right foot with previous stenting rigtht SFA. ECG with no acute changes and R/O BNP only 374. Compliant with meds. High salt diet. Ambulating this am with improved dyspnea but sats still less than 90% off oxygen with ambulation. Echo is pending Denies fever cough sputum. Does not carry diagnosis of COPD but over 40 pack year history of smoking   Subsequent cath by Dr Martinique 03/07/13    Final Conclusions:  1. 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  2. Normal LV function.  CABG:  PVT 3/5 OPERATION:  1. Coronary bypass grafting x3 (left internal mammary artery to left  anterior descending, saphenous vein graft to obtuse marginal,  saphenous vein graft to posterior descending).  2. Endoscopic harvest of right leg greater saphenous vein.  Needs to have cataract surgery Does not recall doctor's name  No dyspnea  Had pleural effusion drained in hospital  Has not been compliant with BP meds Discussed need for good BP and BS contorl  ROS: Denies fever, malais, weight loss, blurry vision, decreased visual acuity, cough, sputum, SOB, hemoptysis, pleuritic pain, palpitaitons, heartburn, abdominal pain, melena, lower extremity edema, claudication, or rash.  All other systems reviewed and negative  General: Affect appropriate Healthy:  appears stated age 63: normal Neck supple with no adenopathy JVP normal no bruits no thyromegaly Lungs decreased BS right base  no  wheezing and good diaphragmatic motion Heart:  S1/S2 no murmur, no rub, gallop or click PMI normal Abdomen: benighn, BS positve, no tenderness, no AAA no bruit.  No HSM or HJR Distal pulses intact with no bruits No edema Neuro non-focal Skin warm and dry No muscular weakness   Current Outpatient Prescriptions  Medication Sig Dispense Refill  . amLODipine (NORVASC) 10 MG tablet Take 1 tablet (10 mg total) by mouth daily.  90 tablet  1  . antipyrine-benzocaine (AURALGAN) otic solution Place 3 drops into the right ear every 2 (two) hours as needed.  10 mL  0  . aspirin 325 MG EC tablet Take 1 tablet (325 mg total) by mouth daily.  30 tablet  0  . Blood Glucose Monitoring Suppl (ACCU-CHEK AVIVA) device Use as instructed  1 each  0  . budesonide-formoterol (SYMBICORT) 160-4.5 MCG/ACT inhaler Inhale 2 puffs into the lungs 2 (two) times daily.  1 Inhaler  12  . cloNIDine (CATAPRES) 0.2 MG tablet Take 1 tablet (0.2 mg total) by mouth 2 (two) times daily.  90 tablet  1  . furosemide (LASIX) 40 MG tablet Take 1 tablet (40 mg total) by mouth daily.  30 tablet  3  . glipiZIDE (GLUCOTROL) 10 MG tablet Take 1 tablet (10 mg total) by mouth 2 (two) times daily before a meal.  90 tablet  1  . glucose blood (ACCU-CHEK AVIVA) test strip Use as instructed  100 each  12  . insulin glargine (LANTUS) 100 units/mL SOLN Inject 30 Units into the skin 2 (two)  times daily.  10 mL  0  . Lancet Devices (ACCU-CHEK SOFTCLIX) lancets Use as instructed to test 4 times a day.  Dx code 250.00.  May change to fit patients meter  100 each  12  . Lancets (ACCU-CHEK SOFT TOUCH) lancets Use as instructed  100 each  12  . lisinopril (PRINIVIL,ZESTRIL) 40 MG tablet Take 1 tablet (40 mg total) by mouth daily.  30 tablet  3  . metFORMIN (GLUCOPHAGE) 1000 MG tablet Take 1 tablet (1,000 mg total) by mouth 2 (two) times daily with a meal.  60 tablet  6  . metoprolol tartrate (LOPRESSOR) 25 MG tablet Take 1 tablet (25 mg total) by mouth  2 (two) times daily.  60 tablet  3  . nicotine (NICODERM CQ - DOSED IN MG/24 HR) 7 mg/24hr patch Place 7 mg onto the skin daily.      Marland Kitchen oxyCODONE (OXY IR/ROXICODONE) 5 MG immediate release tablet Take 1-2 tablets (5-10 mg total) by mouth every 3 (three) hours as needed for moderate pain.  30 tablet  0  . potassium chloride SA (K-DUR,KLOR-CON) 20 MEQ tablet Take 1 tablet (20 mEq total) by mouth daily.  30 tablet  3  . pravastatin (PRAVACHOL) 40 MG tablet Take 1 tablet (40 mg total) by mouth daily.  90 tablet  1   No current facility-administered medications for this visit.    Allergies  Review of patient's allergies indicates no known allergies.  Electrocardiogram:  NSR possible old IMI   Assessment and Plan

## 2013-05-04 NOTE — Assessment & Plan Note (Signed)
Cholesterol is at goal.  Continue current dose of statin and diet Rx.  No myalgias or side effects.  F/U  LFT's in 6 months. Lab Results  Component Value Date   LDLCALC 40 11/25/2012

## 2013-05-04 NOTE — Assessment & Plan Note (Signed)
S/P CABG Doing well Sternum well healed Check CXR of residual pleural effusion.  Ok to have cataract surgery

## 2013-05-04 NOTE — Assessment & Plan Note (Signed)
He is on 4 meds for BP and indicates it runs ok at home but has not taken meds last 2 days Has neighbor Deneise Lever that can help him

## 2013-05-04 NOTE — Telephone Encounter (Signed)
New message   Patient wife calling back with information.   Dr. Satira Sark office [phone].

## 2013-05-04 NOTE — Telephone Encounter (Signed)
Faxed pt' office note  from today  .  To  Dr Mellissa Kohut office  At [phone] . Pt   May proceed with   Cataract   Surgery ./cy

## 2013-05-09 ENCOUNTER — Other Ambulatory Visit: Payer: Self-pay | Admitting: *Deleted

## 2013-05-09 DIAGNOSIS — I2510 Atherosclerotic heart disease of native coronary artery without angina pectoris: Secondary | ICD-10-CM

## 2013-05-10 ENCOUNTER — Other Ambulatory Visit: Payer: Self-pay | Admitting: Family Medicine

## 2013-05-10 DIAGNOSIS — J449 Chronic obstructive pulmonary disease, unspecified: Secondary | ICD-10-CM

## 2013-05-10 DIAGNOSIS — E119 Type 2 diabetes mellitus without complications: Secondary | ICD-10-CM

## 2013-05-10 DIAGNOSIS — I119 Hypertensive heart disease without heart failure: Secondary | ICD-10-CM

## 2013-05-10 MED ORDER — POTASSIUM CHLORIDE CRYS ER 20 MEQ PO TBCR: 20.0000 meq | EXTENDED_RELEASE_TABLET | Freq: Every day | ORAL | Status: DC

## 2013-05-10 MED ORDER — CLONIDINE HCL 0.2 MG PO TABS: 0.2000 mg | ORAL_TABLET | Freq: Two times a day (BID) | ORAL | Status: DC

## 2013-05-10 MED ORDER — BUDESONIDE-FORMOTEROL FUMARATE 160-4.5 MCG/ACT IN AERO: 2.0000 | INHALATION_SPRAY | Freq: Two times a day (BID) | RESPIRATORY_TRACT | Status: DC

## 2013-05-10 MED ORDER — METFORMIN HCL 1000 MG PO TABS: 1000.0000 mg | ORAL_TABLET | Freq: Two times a day (BID) | ORAL | Status: DC

## 2013-05-10 MED ORDER — ACCU-CHEK SOFTCLIX LANCET DEV MISC: Status: DC

## 2013-05-10 MED ORDER — PRAVASTATIN SODIUM 40 MG PO TABS: 40.0000 mg | ORAL_TABLET | Freq: Every day | ORAL | Status: DC

## 2013-05-10 MED ORDER — ANTIPYRINE-BENZOCAINE 5.4-1.4 % OT SOLN: 3.0000 [drp] | OTIC | Status: DC | PRN

## 2013-05-10 MED ORDER — LISINOPRIL 40 MG PO TABS: 40.0000 mg | ORAL_TABLET | Freq: Every day | ORAL | Status: DC

## 2013-05-10 MED ORDER — ASPIRIN 325 MG PO TBEC: 325.0000 mg | DELAYED_RELEASE_TABLET | Freq: Every day | ORAL | Status: DC

## 2013-05-10 MED ORDER — METOPROLOL TARTRATE 25 MG PO TABS: 25.0000 mg | ORAL_TABLET | Freq: Two times a day (BID) | ORAL | Status: DC

## 2013-05-10 MED ORDER — AMLODIPINE BESYLATE 10 MG PO TABS: 10.0000 mg | ORAL_TABLET | Freq: Every day | ORAL | Status: DC

## 2013-05-10 MED ORDER — GLIPIZIDE 10 MG PO TABS: 10.0000 mg | ORAL_TABLET | Freq: Two times a day (BID) | ORAL | Status: DC

## 2013-05-10 MED ORDER — FUROSEMIDE 40 MG PO TABS: 40.0000 mg | ORAL_TABLET | Freq: Every day | ORAL | Status: DC

## 2013-05-10 NOTE — Telephone Encounter (Signed)
Patient's medications sent to new pharmacy as requested.

## 2013-05-11 ENCOUNTER — Ambulatory Visit
Admission: RE | Admit: 2013-05-11 | Discharge: 2013-05-11 | Disposition: A | Discharge status: Home / Self Care | Payer: Medicaid Other | Source: Ambulatory Visit | Attending: Cardiothoracic Surgery | Admitting: Cardiothoracic Surgery

## 2013-05-11 ENCOUNTER — Encounter: Payer: Self-pay | Admitting: Cardiothoracic Surgery

## 2013-05-11 ENCOUNTER — Ambulatory Visit (INDEPENDENT_AMBULATORY_CARE_PROVIDER_SITE_OTHER): Payer: Medicaid Other | Admitting: Cardiothoracic Surgery

## 2013-05-11 VITALS — BP 115/72 | HR 82 | Resp 16 | Ht 68.0 in | Wt 212.0 lb

## 2013-05-11 DIAGNOSIS — Z951 Presence of aortocoronary bypass graft: Secondary | ICD-10-CM

## 2013-05-11 DIAGNOSIS — I2510 Atherosclerotic heart disease of native coronary artery without angina pectoris: Secondary | ICD-10-CM

## 2013-05-11 NOTE — Progress Notes (Signed)
PCP is Rosemarie Ax, MD Referring Provider is Martinique, Karah Caruthers M, MD  Chief Complaint  Patient presents with  . Routine Post Op    4 wk f/u with cxr s/p CABG X 3 ...04/07/13....saw Dr. Johnsie Cancel 05/04/13    HPI: The patient returns for her first postop visit after multivessel CABG after presenting with unstable angina, heart failure and severe three-vessel CAD. Postoperatively he did well but did require a right thoracentesis for a postop right pleural effusion felt to be due to probable diastolic dysfunction and diastolic heart failure. The patient has not smoked in several weeks. He denies angina or symptoms of CHF. He denies cough. His chest x-ray today shows recurrence of a mild-moderate right pleural effusion which appears to be asymptomatic. He is currently taking Lasix 40 mg a day although he did not bring his medications to confirm.   Past Medical History  Diagnosis Date  . Hypertension   . PAD (peripheral artery disease)   . Gangrene of toe     right 5th/notes 01/04/2013   . High cholesterol   . Type II diabetes mellitus     Past Surgical History  Procedure Laterality Date  . Throat surgery  1983    polyps  . Angioplasty / stenting femoral Right 01/13/2013    superficial femoral artery x 2 (3 mm x 60 mm, 4 mm x 60 mm)  . Tonsillectomy and adenoidectomy  ~ 1965  . Amputation Right 01/28/2013    Procedure: RAY AMPUTATION RIGHT  4th & 5th TOE;  Surgeon: Rosetta Posner, MD;  Location: [facility] OR;  Service: Vascular;  Laterality: Right;  . Coronary artery bypass graft N/A 04/07/2013    Procedure: CORONARY ARTERY BYPASS GRAFTING (CABG);  Surgeon: Ivin Poot, MD;  Location: [place];  Service: Open Heart Surgery;  Laterality: N/A;  . Intraoperative transesophageal echocardiogram N/A 04/07/2013    Procedure: INTRAOPERATIVE TRANSESOPHAGEAL ECHOCARDIOGRAM;  Surgeon: Ivin Poot, MD;  Location: [place];  Service: Open Heart Surgery;  Laterality: N/A;    Family History  Problem Relation Age of  Onset  . Cancer Mother     lung cancer  . Hypertension Mother   . Cancer Sister     patient thinks it was uterine cancer  . Hypertension Sister     Social History History  Substance Use Topics  . Smoking status: Former Smoker -- 1.00 packs/day for 44 years    Types: Cigarettes  . Smokeless tobacco: Never Used  . Alcohol Use: No    Current Outpatient Prescriptions  Medication Sig Dispense Refill  . amLODipine (NORVASC) 10 MG tablet Take 1 tablet (10 mg total) by mouth daily.  90 tablet  1  . antipyrine-benzocaine (AURALGAN) otic solution Place 3 drops into the right ear every 2 (two) hours as needed.  10 mL  0  . aspirin 325 MG EC tablet Take 1 tablet (325 mg total) by mouth daily.  30 tablet  0  . Blood Glucose Monitoring Suppl (ACCU-CHEK AVIVA) device Use as instructed  1 each  0  . budesonide-formoterol (SYMBICORT) 160-4.5 MCG/ACT inhaler Inhale 2 puffs into the lungs 2 (two) times daily.  1 Inhaler  12  . cloNIDine (CATAPRES) 0.2 MG tablet Take 1 tablet (0.2 mg total) by mouth 2 (two) times daily.  90 tablet  1  . furosemide (LASIX) 40 MG tablet Take 1 tablet (40 mg total) by mouth daily.  30 tablet  3  . glipiZIDE (GLUCOTROL) 10 MG tablet Take 1  tablet (10 mg total) by mouth 2 (two) times daily before a meal.  90 tablet  1  . glucose blood (ACCU-CHEK AVIVA) test strip Use as instructed  100 each  12  . insulin glargine (LANTUS) 100 units/mL SOLN Inject 30 Units into the skin 2 (two) times daily.  10 mL  0  . Lancet Devices (ACCU-CHEK SOFTCLIX) lancets Use as instructed to test 4 times a day.  Dx code 250.00.  May change to fit patients meter  100 each  12  . Lancets (ACCU-CHEK SOFT TOUCH) lancets Use as instructed  100 each  12  . lisinopril (PRINIVIL,ZESTRIL) 40 MG tablet Take 1 tablet (40 mg total) by mouth daily.  30 tablet  3  . metFORMIN (GLUCOPHAGE) 1000 MG tablet Take 1 tablet (1,000 mg total) by mouth 2 (two) times daily with a meal.  60 tablet  6  . metoprolol tartrate  (LOPRESSOR) 25 MG tablet Take 1 tablet (25 mg total) by mouth 2 (two) times daily.  60 tablet  3  . nicotine (NICODERM CQ - DOSED IN MG/24 HR) 7 mg/24hr patch Place 7 mg onto the skin daily.      Marland Kitchen oxyCODONE (OXY IR/ROXICODONE) 5 MG immediate release tablet Take 1-2 tablets (5-10 mg total) by mouth every 3 (three) hours as needed for moderate pain.  30 tablet  0  . potassium chloride SA (K-DUR,KLOR-CON) 20 MEQ tablet Take 1 tablet (20 mEq total) by mouth daily.  30 tablet  3  . pravastatin (PRAVACHOL) 40 MG tablet Take 1 tablet (40 mg total) by mouth daily.  90 tablet  1   No current facility-administered medications for this visit.    No Known Allergies  Review of Systems no fever or drainage from the surgical incisions. His overall strength and appetite are steadily improving. No peripheral edema  BP 115/72  Pulse 82  Resp 16  Ht 5\' 8"  (1.727 m)  Wt 212 lb (96.163 kg)  BMI 32.24 kg/m2  SpO2 98% Physical Exam Alert and comfortable Lungs clear on left, diminished breath sounds at right base Heart rate regular heart without murmur or gallop Abdomen soft Extremities without edema Sternal and leg incision is well-healed  Diagnostic Tests: Chest x-ray shows recurrence of right mild-moderate pleural effusion  Impression: Will add Zaroxolyn to his daily Lasix and have the patient return in 3-4 weeks with chest x-ray. If The pleural effusion persists he will probably need thoracentesis.  Plan: Return with chest x-ray in 3-4 weeks.

## 2013-05-16 ENCOUNTER — Other Ambulatory Visit: Payer: Self-pay | Admitting: Physician Assistant

## 2013-05-17 ENCOUNTER — Other Ambulatory Visit: Payer: Self-pay | Admitting: Cardiovascular Disease

## 2013-05-19 ENCOUNTER — Encounter: Payer: Self-pay | Admitting: Podiatrist

## 2013-05-19 ENCOUNTER — Ambulatory Visit (INDEPENDENT_AMBULATORY_CARE_PROVIDER_SITE_OTHER): Payer: Medicaid Other | Admitting: Podiatrist

## 2013-05-19 VITALS — BP 139/76 | HR 72 | Resp 16

## 2013-05-19 DIAGNOSIS — L97509 Non-pressure chronic ulcer of other part of unspecified foot with unspecified severity: Secondary | ICD-10-CM

## 2013-05-19 DIAGNOSIS — B351 Tinea unguium: Secondary | ICD-10-CM

## 2013-05-19 DIAGNOSIS — I739 Peripheral vascular disease, unspecified: Secondary | ICD-10-CM

## 2013-05-19 DIAGNOSIS — E1140 Type 2 diabetes mellitus with diabetic neuropathy, unspecified: Secondary | ICD-10-CM

## 2013-05-19 DIAGNOSIS — E1142 Type 2 diabetes mellitus with diabetic polyneuropathy: Secondary | ICD-10-CM

## 2013-05-19 DIAGNOSIS — E1149 Type 2 diabetes mellitus with other diabetic neurological complication: Secondary | ICD-10-CM

## 2013-05-19 DIAGNOSIS — M79609 Pain in unspecified limb: Secondary | ICD-10-CM

## 2013-05-19 NOTE — Progress Notes (Signed)
   No chief complaint on file.    HPI: Leonard [name] is 63 y.o. male who presents today for diabetic foot care; evaluation of ulcer; nail trim and general foot check.  He was last seen by Dr. Blenda Mounts in October.  Since that time he had an infection worsen in his right foot and had an amputation of the 4th and 5th toes and apparently part of the metatarsal as well for infection.  He still has an ulcer submetatarsal 1 of the right foot.   Physical Exam GENERAL APPEARANCE: Alert, conversant. Appropriately groomed. No acute distress.  VASCULAR: Pedal pulses are non palpable.  PVD is being treated by Dr. Donnetta Hutching.  He has cool toes and absence of hair growth NEUROLOGIC: sensation is absent epicritically and protectively to 5.07 monofilament at 0/5 sites bilateral.  Light touch is absent bilateral, vibratory sensation absent bilateral, achilles tendon reflex is absent bilateral.  MUSCULOSKELETAL: partial amputation of the lateral foot right has been performed in the past.  It appears to be digits 4,5 and metatarsal 5 as well.  Contracture of digits present biilateral.   DERMATOLOGIC: large area of hyperkeratotic lesion present along the surgical incision of the right foot. The distal portion of the incision has not fully closed and there is some maceration present.  No active drainage or pus is present.  Ulceration submetatarsal 1 of the right foot is present.  Is measures 7 mm in diameter and has a granular base.  No undermining or probing to bone is present.  All digital nails x 8 are mycotic and thick  Assessment: ulceration right foot.  Mycotic toenails, diabetes with neuropathy and pvd  Plan: debrided the ulcerations, applied iodosorb and a dressing.  Recommended a darco shoe which was dispensed.  He will need an rx for diabetic shoes in the future.  Debrided his toenails.  He will be seen in 1-2 weeks for follow up.  Will order dressing supplies through [facility].  Will [facility] through [facility] as he is  unable to care for his wound himself

## 2013-05-19 NOTE — Patient Instructions (Signed)
Instructions for Wound Care  The most important step to healing a foot wound is to reduce the pressure on your foot - it is extremely important to stay off your foot as much as possible and wear the shoe/boot as instructed.  Cleanse your foot with saline wash or warm soapy water (dial antibacterial soap or similar).  Blot dry.  Apply prescribed medication to your wound and cover with gauze and a bandage.  May hold bandage in place with Coban (self sticky wrap), Ace bandage or tape.  You may find dressing supplies at your local [employer], [employer], drug store or medical supply store.  Your prescribed topical medication is :  Lodosorb Gel (once or twice daily depending on drainage)  [facility] is a mail order medical supply company that we use to provide some of our would care products.  If we use their service of you, you will receive the product by mail.  If you have not received the medication in 3 business days, please call our office.  If you notice any foul odor, increase in pain, pus, increased swelling, red streaks or generalized redness occurring in your foot or leg-Call our office immediately to be seen.  This may be a sign of a limb or life threatening infection that will need prompt attention.  Trudie Buckler, DPM

## 2013-05-20 ENCOUNTER — Other Ambulatory Visit: Payer: Self-pay | Admitting: Cardiothoracic Surgery

## 2013-05-20 DIAGNOSIS — I214 Non-ST elevation (NSTEMI) myocardial infarction: Secondary | ICD-10-CM

## 2013-05-24 ENCOUNTER — Other Ambulatory Visit: Payer: Self-pay | Admitting: Family Medicine

## 2013-05-24 ENCOUNTER — Telehealth: Payer: Self-pay | Admitting: *Deleted

## 2013-05-24 ENCOUNTER — Telehealth: Payer: Self-pay | Admitting: Clinical

## 2013-05-24 DIAGNOSIS — L97509 Non-pressure chronic ulcer of other part of unspecified foot with unspecified severity: Secondary | ICD-10-CM

## 2013-05-24 NOTE — Telephone Encounter (Signed)
Since mediad won't pay for the products I think the best plan for this wound is to send him to the wound care center-  They have more resources there to help. For now, do a referral to the wound center and call in silvadene cream.  He can go to the dollar store as they usually have gauze and tape, etc.

## 2013-05-24 NOTE — Telephone Encounter (Signed)
We received a call from [facility] and they said that Medicaid denied the order for the Iodosorb.  Can she prescribe something else?  Medicaid wouldn't cover the supplies either, they said it would be $3 a roll.  Can you arrange for [facility].  I informed her we have put in an order for [facility] but they don't supply wound care products.  I told her I would check with Dr. Valentina Lucks about prescribing another medication.  I'll let them know when I get a response.

## 2013-05-24 NOTE — Telephone Encounter (Signed)
Order was placed for wound care supplies per Dr. Valentina Lucks. Iodosorb, 4x4 gauze, kling and 2" paper tape   Patent examiner Phone # [phone]

## 2013-05-24 NOTE — Telephone Encounter (Signed)
CSW received a call from [facility] with [facility] requesting a PCS application for pt. CSW has started application and placed in PCP box for completion. CSW to follow up with Idelle Leech once application completed and faxed to [employer].  Hunt Oris, MSW, [place]

## 2013-05-24 NOTE — Telephone Encounter (Signed)
An order was placed for home health care per Dr. Valentina Lucks  [facility] [place], [place] Phone # [phone]

## 2013-05-25 ENCOUNTER — Encounter: Payer: Self-pay | Admitting: Cardiothoracic Surgery

## 2013-05-25 ENCOUNTER — Ambulatory Visit (INDEPENDENT_AMBULATORY_CARE_PROVIDER_SITE_OTHER): Payer: Self-pay | Admitting: Cardiothoracic Surgery

## 2013-05-25 ENCOUNTER — Other Ambulatory Visit: Payer: Self-pay | Admitting: Cardiothoracic Surgery

## 2013-05-25 ENCOUNTER — Ambulatory Visit
Admission: RE | Admit: 2013-05-25 | Discharge: 2013-05-25 | Disposition: A | Discharge status: Home / Self Care | Payer: Medicaid Other | Source: Ambulatory Visit | Attending: Cardiothoracic Surgery | Admitting: Cardiothoracic Surgery

## 2013-05-25 VITALS — BP 150/86 | HR 84 | Resp 20 | Ht 68.0 in | Wt 212.0 lb

## 2013-05-25 DIAGNOSIS — I214 Non-ST elevation (NSTEMI) myocardial infarction: Secondary | ICD-10-CM

## 2013-05-25 DIAGNOSIS — I2510 Atherosclerotic heart disease of native coronary artery without angina pectoris: Secondary | ICD-10-CM

## 2013-05-25 DIAGNOSIS — J90 Pleural effusion, not elsewhere classified: Secondary | ICD-10-CM

## 2013-05-25 DIAGNOSIS — Z951 Presence of aortocoronary bypass graft: Secondary | ICD-10-CM

## 2013-05-25 MED ORDER — SILVER SULFADIAZINE 1 % EX CREA: 1.0000 "application " | TOPICAL_CREAM | Freq: Every day | CUTANEOUS | Status: DC

## 2013-05-25 NOTE — Progress Notes (Signed)
PCP is Rosemarie Ax, MD Referring Provider is Rosemarie Ax, MD  Chief Complaint  Patient presents with  . Routine Post Op    2 week f/u wtih CXR, eval right pleural effusion    HPI: Patient returns for 2 week followup of right pleural effusion postop CABG with diastolic heart failure. He is treated with a short course of oral Zaroxolyn in addition to his daily Lasix. He is watching his salt intake as well. He is not smoking. He is noted is breathing is better. His followup chest x-ray today shows significant reduction in the right pleural effusion. He is having some diabetic foot issues and is being seen in the wound clinic   Past Medical History  Diagnosis Date  . Hypertension   . PAD (peripheral artery disease)   . Gangrene of toe     right 5th/notes 01/04/2013   . High cholesterol   . Type II diabetes mellitus     Past Surgical History  Procedure Laterality Date  . Throat surgery  1983    polyps  . Angioplasty / stenting femoral Right 01/13/2013    superficial femoral artery x 2 (3 mm x 60 mm, 4 mm x 60 mm)  . Tonsillectomy and adenoidectomy  ~ 1965  . Amputation Right 01/28/2013    Procedure: RAY AMPUTATION RIGHT  4th & 5th TOE;  Surgeon: Rosetta Posner, MD;  Location: [facility] OR;  Service: Vascular;  Laterality: Right;  . Coronary artery bypass graft N/A 04/07/2013    Procedure: CORONARY ARTERY BYPASS GRAFTING (CABG);  Surgeon: Ivin Poot, MD;  Location: [place];  Service: Open Heart Surgery;  Laterality: N/A;  . Intraoperative transesophageal echocardiogram N/A 04/07/2013    Procedure: INTRAOPERATIVE TRANSESOPHAGEAL ECHOCARDIOGRAM;  Surgeon: Ivin Poot, MD;  Location: [place];  Service: Open Heart Surgery;  Laterality: N/A;    Family History  Problem Relation Age of Onset  . Cancer Mother     lung cancer  . Hypertension Mother   . Cancer Sister     patient thinks it was uterine cancer  . Hypertension Sister     Social History History  Substance Use Topics   . Smoking status: Former Smoker -- 1.00 packs/day for 44 years    Types: Cigarettes  . Smokeless tobacco: Never Used  . Alcohol Use: No    Current Outpatient Prescriptions  Medication Sig Dispense Refill  . amLODipine (NORVASC) 10 MG tablet Take 1 tablet (10 mg total) by mouth daily.  90 tablet  1  . antipyrine-benzocaine (AURALGAN) otic solution Place 3 drops into the right ear every 2 (two) hours as needed.  10 mL  0  . aspirin 325 MG EC tablet Take 1 tablet (325 mg total) by mouth daily.  30 tablet  0  . Blood Glucose Monitoring Suppl (ACCU-CHEK AVIVA) device Use as instructed  1 each  0  . cloNIDine (CATAPRES) 0.2 MG tablet Take 1 tablet (0.2 mg total) by mouth 2 (two) times daily.  90 tablet  1  . furosemide (LASIX) 40 MG tablet Take 1 tablet (40 mg total) by mouth daily.  30 tablet  3  . glipiZIDE (GLUCOTROL) 10 MG tablet Take 1 tablet (10 mg total) by mouth 2 (two) times daily before a meal.  90 tablet  1  . glucose blood (ACCU-CHEK AVIVA) test strip Use as instructed  100 each  12  . insulin glargine (LANTUS) 100 units/mL SOLN Inject 30 Units into the skin 2 (two)  times daily.  10 mL  0  . Lancet Devices (ACCU-CHEK SOFTCLIX) lancets Use as instructed to test 4 times a day.  Dx code 250.00.  May change to fit patients meter  100 each  12  . Lancets (ACCU-CHEK SOFT TOUCH) lancets Use as instructed  100 each  12  . lisinopril (PRINIVIL,ZESTRIL) 40 MG tablet Take 1 tablet (40 mg total) by mouth daily.  30 tablet  3  . metFORMIN (GLUCOPHAGE) 1000 MG tablet Take 1 tablet (1,000 mg total) by mouth 2 (two) times daily with a meal.  60 tablet  6  . metolazone (ZAROXOLYN) 5 MG tablet Take 5 mg by mouth daily.      . metoprolol tartrate (LOPRESSOR) 25 MG tablet Take 1 tablet (25 mg total) by mouth 2 (two) times daily.  60 tablet  3  . nicotine (NICODERM CQ - DOSED IN MG/24 HR) 7 mg/24hr patch Place 7 mg onto the skin daily.      Marland Kitchen oxyCODONE (OXY IR/ROXICODONE) 5 MG immediate release tablet  Take 1-2 tablets (5-10 mg total) by mouth every 3 (three) hours as needed for moderate pain.  30 tablet  0  . potassium chloride SA (K-DUR,KLOR-CON) 20 MEQ tablet Take 1 tablet (20 mEq total) by mouth daily.  30 tablet  3  . pravastatin (PRAVACHOL) 40 MG tablet Take 1 tablet (40 mg total) by mouth daily.  90 tablet  1  . silver sulfADIAZINE (SILVADENE) 1 % cream Apply 1 application topically daily.  50 g  2   No current facility-administered medications for this visit.    No Known Allergies  Review of Systems no angina, some swelling in his right foot when it is dependent in position  sternal incision well-healed  no shortness of breath  BP 150/86  Pulse 84  Resp 20  Ht 5\' 8"  (1.727 m)  Wt 212 lb (96.163 kg)  BMI 32.24 kg/m2  SpO2 96% Physical Exam Alert and comfortable Lungs slightly diminished breath sounds right base Sternal incision well-healed Heart rhythm regular without gallop Left leg incision well-healed Right foot exam and, small but clean neuropathic ulcer plantar surface of right great toe  Diagnostic Tests:  Chest x-ray today reviewed showing significant room in the right pleural effusion after 7 days of Zaroxolyn   Impression: Patient appears be doing very well. No changes in management but I'll seem back in 4 weeks with chest x-ray to make sure he does not have a late recurrence of the right pleural effusion  Plan: Return in 4 weeks with chest x-ray

## 2013-05-25 NOTE — Telephone Encounter (Signed)
I called and left a message for the patient about the medication and referral to the [facility].  I spoke to Leonard [name].  I told them to expect a call from Wound Care to shedule the appointment.  I also left a message with Leonard [name].

## 2013-05-26 ENCOUNTER — Other Ambulatory Visit: Payer: Self-pay | Admitting: Family Medicine

## 2013-05-26 ENCOUNTER — Telehealth: Payer: Self-pay | Admitting: Clinical

## 2013-05-26 DIAGNOSIS — I119 Hypertensive heart disease without heart failure: Secondary | ICD-10-CM

## 2013-05-26 MED ORDER — PRAVASTATIN SODIUM 40 MG PO TABS: 40.0000 mg | ORAL_TABLET | Freq: Every day | ORAL | Status: DC

## 2013-05-26 NOTE — Telephone Encounter (Signed)
Refilled pravastatin. 

## 2013-05-26 NOTE — Telephone Encounter (Signed)
Clinical Education officer, museum (CSW) has faxed PCS form to [employer]. CSW has made [facility] case worker Maretha aware.  Hunt Oris, MSW, [place]

## 2013-05-27 ENCOUNTER — Ambulatory Visit: Payer: Medicaid Other | Admitting: Podiatrist

## 2013-05-27 ENCOUNTER — Ambulatory Visit (INDEPENDENT_AMBULATORY_CARE_PROVIDER_SITE_OTHER): Payer: Medicaid Other | Admitting: Podiatrist

## 2013-05-27 ENCOUNTER — Encounter: Payer: Self-pay | Admitting: Podiatrist

## 2013-05-27 VITALS — BP 152/86 | HR 72 | Resp 12

## 2013-05-27 DIAGNOSIS — E1169 Type 2 diabetes mellitus with other specified complication: Secondary | ICD-10-CM

## 2013-05-27 DIAGNOSIS — L97509 Non-pressure chronic ulcer of other part of unspecified foot with unspecified severity: Secondary | ICD-10-CM

## 2013-05-27 DIAGNOSIS — E11621 Type 2 diabetes mellitus with foot ulcer: Secondary | ICD-10-CM

## 2013-05-27 DIAGNOSIS — E119 Type 2 diabetes mellitus without complications: Secondary | ICD-10-CM

## 2013-06-03 NOTE — Progress Notes (Signed)
Patient presents for follow up of ulcer submetatarsal 1 of the right foot.  He states he has been treating it with the silvadene cream and wrapping it up.  We were unable to obtain the iodosorb through his insurance and he is unable to afford the medication.  He denies any local or systemic signs or symptoms of infection.   Physical Exam  GENERAL APPEARANCE: Alert, conversant. Appropriately groomed. No acute distress.  VASCULAR: Pedal pulses are non palpable. PVD is being treated by Dr. Donnetta Hutching. He has cool toes and absence of hair growth  NEUROLOGIC: sensation is absent epicritically and protectively to 5.07 monofilament at 0/5 sites bilateral. Light touch is absent bilateral, vibratory sensation absent bilateral, achilles tendon reflex is absent bilateral.  MUSCULOSKELETAL: partial amputation of the lateral foot right has been performed in the past. It appears to be digits 4,5 and metatarsal 5 as well. Contracture of digits present biilateral.  DERMATOLOGIC: large area of hyperkeratotic lesion present along the surgical incision of the right foot. The distal portion of the incision has not fully closed and there is some maceration present. No active drainage or pus is present. Ulceration submetatarsal 1 of the right foot is present. It continues to  measure 7 mm in diameter and has a granular base. No undermining or probing to bone is present.   Assessment: ulceration right foot. diabetes with neuropathy and pvd   Plan: debrided the ulcerations, applied iodosorb and a dressing. Recommended continued wear of a darco shoe which was adjusted with pcell.  Recommended a wound center referral.

## 2013-06-09 ENCOUNTER — Telehealth: Payer: Self-pay | Admitting: *Deleted

## 2013-06-09 ENCOUNTER — Encounter (HOSPITAL_BASED_OUTPATIENT_CLINIC_OR_DEPARTMENT_OTHER): Payer: Medicaid Other | Attending: Internal Medicine

## 2013-06-09 DIAGNOSIS — I2510 Atherosclerotic heart disease of native coronary artery without angina pectoris: Secondary | ICD-10-CM | POA: Insufficient documentation

## 2013-06-09 DIAGNOSIS — Z951 Presence of aortocoronary bypass graft: Secondary | ICD-10-CM | POA: Insufficient documentation

## 2013-06-09 DIAGNOSIS — L97509 Non-pressure chronic ulcer of other part of unspecified foot with unspecified severity: Secondary | ICD-10-CM | POA: Insufficient documentation

## 2013-06-09 DIAGNOSIS — E119 Type 2 diabetes mellitus without complications: Secondary | ICD-10-CM | POA: Insufficient documentation

## 2013-06-09 DIAGNOSIS — Z794 Long term (current) use of insulin: Secondary | ICD-10-CM | POA: Insufficient documentation

## 2013-06-09 DIAGNOSIS — I739 Peripheral vascular disease, unspecified: Secondary | ICD-10-CM | POA: Insufficient documentation

## 2013-06-09 DIAGNOSIS — E785 Hyperlipidemia, unspecified: Secondary | ICD-10-CM | POA: Insufficient documentation

## 2013-06-09 DIAGNOSIS — I10 Essential (primary) hypertension: Secondary | ICD-10-CM | POA: Insufficient documentation

## 2013-06-09 DIAGNOSIS — S98139A Complete traumatic amputation of one unspecified lesser toe, initial encounter: Secondary | ICD-10-CM | POA: Insufficient documentation

## 2013-06-09 DIAGNOSIS — Z79899 Other long term (current) drug therapy: Secondary | ICD-10-CM | POA: Insufficient documentation

## 2013-06-09 DIAGNOSIS — I509 Heart failure, unspecified: Secondary | ICD-10-CM | POA: Insufficient documentation

## 2013-06-09 LAB — GLUCOSE, CAPILLARY: Glucose-Capillary: 234 mg/dL — ABNORMAL HIGH (ref 70–99)

## 2013-06-09 NOTE — Telephone Encounter (Signed)
Shanon Brow from [facility] call to request NPI number.  NPI number given x 3 visits; pt seen for non-healing wound.  Derl Barrow, RN

## 2013-06-10 NOTE — Progress Notes (Signed)
[facility]  NAME:  Leonard [name], Leonard [name]              ACCOUNT NO.:  [id number]  MEDICAL RECORD NO.:  [id number]      DATE OF BIRTH:  09-22-1950  PHYSICIAN:  Ricard Dillon, M.D.      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  He arrives for a review of the wound over the right first metatarsal head, and an open area on the distal aspect of his previous right lateral foot surgery.  The patient is a type 2 diabetic.  He has a known history of peripheral neuropathy, and PAD status post right femoral angioplasty also in December 2014.  He tells [place] that the wound on the base of his right first metatarsal has actually been present for a year although this does not seem well verified on the notes that come with him.  He was unaware of any particular problem with his surgical site on the right foot stated that this happened when we took his dressing off today I think that is unlikely.  PAST MEDICAL HISTORY: 1. Coronary artery disease. 2. Type 2 diabetes with PAD and peripheral neuropathy. 3. Hypertension. 4. Hyperlipidemia. 5. History of congestive heart failure.  PAST SURGICAL HISTORY: 1. CABG x3. 2. Right fourth and fifth question ray amputation. 3. Right femoral angioplasty. 4. Tonsillectomy. 5. Adenoidectomy.  MEDICATIONS: 1. Lantus insulin 30 b.i.d. 2. Potassium 20 daily. 3. Metformin 1000 b.i.d. 4. Amlodipine 10 daily. 5. Lasix 40 daily. 6. Lisinopril 40 daily. 7. Pravastatin 40 daily. 8. Clonidine 0.2 b.i.d. 9. Glipizide 10 b.i.d. 10.ASA 325 daily. 11.Metoprolol 25 b.i.d. 12.Silvadene cream. 13.Oxycodone p.r.n.  PHYSICAL EXAMINATION:  VITAL SIGNS:  Temperature 98.4, pulse 88, respirations 18, blood pressure 181/82. VASCULAR ASSESSMENT:  An ABI  of 0.78 on the right.  Peripheral pulses were difficult to feel. RESPIRATORY:  Clear air entry bilaterally. CARDIAC:  Heart sounds were normal with no murmurs.  No signs of heart failure.  Wound examination on the right foot were 2 open areas.  The base of the right first metatarsal head measuring 0.4 x 0.3 x 0.9.  This underwent an extensive surgical debridement removing skin and subcutaneous tissue, which was necrotic.  After an extensive debridement, this area actually had a fairly clean base.  I did culture the base of this wound but did not give him empiric antibiotics.  On the lateral aspect of the foot at his previous surgical site in December with a small open area, however, it has 0.9 cm in depth.  The patient denied any knowledge of this.  There was no overt infection.  IMPRESSION:  Wagner's grade 2 wounds as described.  We dressed this with silver alginate, Adaptic, and a Kerlix and net wrap.  He already has a healing sandal, I think that he got through podiatry.  The exact history of these wounds is not completely clear.  He may need a repeat MRI of the foot.  His previous MRI in December done before his surgery documented osteomyelitis which was quite extensive.  ______________________________ Ricard Dillon, M.D.     MGR/MEDQ  D:  06/09/2013  T:  06/10/2013  Job:  [id number]

## 2013-06-16 ENCOUNTER — Ambulatory Visit (HOSPITAL_COMMUNITY)
Admission: RE | Admit: 2013-06-16 | Discharge: 2013-06-16 | Disposition: A | Discharge status: Home / Self Care | Payer: Medicaid Other | Source: Ambulatory Visit | Attending: Internal Medicine | Admitting: Internal Medicine

## 2013-06-16 ENCOUNTER — Other Ambulatory Visit (HOSPITAL_BASED_OUTPATIENT_CLINIC_OR_DEPARTMENT_OTHER): Payer: Self-pay | Admitting: Internal Medicine

## 2013-06-16 DIAGNOSIS — S98139A Complete traumatic amputation of one unspecified lesser toe, initial encounter: Secondary | ICD-10-CM | POA: Insufficient documentation

## 2013-06-16 DIAGNOSIS — M7989 Other specified soft tissue disorders: Secondary | ICD-10-CM | POA: Insufficient documentation

## 2013-06-16 DIAGNOSIS — M869 Osteomyelitis, unspecified: Secondary | ICD-10-CM

## 2013-06-16 LAB — GLUCOSE, CAPILLARY: Glucose-Capillary: 223 mg/dL — ABNORMAL HIGH (ref 70–99)

## 2013-06-20 ENCOUNTER — Other Ambulatory Visit: Payer: Self-pay | Admitting: Family Medicine

## 2013-06-21 ENCOUNTER — Other Ambulatory Visit: Payer: Self-pay | Admitting: Cardiothoracic Surgery

## 2013-06-21 DIAGNOSIS — J810 Acute pulmonary edema: Secondary | ICD-10-CM

## 2013-06-22 ENCOUNTER — Ambulatory Visit (INDEPENDENT_AMBULATORY_CARE_PROVIDER_SITE_OTHER): Payer: Self-pay | Admitting: Cardiothoracic Surgery

## 2013-06-22 ENCOUNTER — Ambulatory Visit
Admission: RE | Admit: 2013-06-22 | Discharge: 2013-06-22 | Disposition: A | Discharge status: Home / Self Care | Payer: Medicaid Other | Source: Ambulatory Visit | Attending: Cardiothoracic Surgery | Admitting: Cardiothoracic Surgery

## 2013-06-22 ENCOUNTER — Encounter: Payer: Self-pay | Admitting: Cardiothoracic Surgery

## 2013-06-22 VITALS — BP 146/86 | HR 83 | Resp 20 | Ht 68.0 in | Wt 212.0 lb

## 2013-06-22 DIAGNOSIS — J810 Acute pulmonary edema: Secondary | ICD-10-CM

## 2013-06-22 DIAGNOSIS — I2510 Atherosclerotic heart disease of native coronary artery without angina pectoris: Secondary | ICD-10-CM

## 2013-06-22 DIAGNOSIS — J90 Pleural effusion, not elsewhere classified: Secondary | ICD-10-CM

## 2013-06-22 DIAGNOSIS — Z951 Presence of aortocoronary bypass graft: Secondary | ICD-10-CM

## 2013-06-22 NOTE — Progress Notes (Signed)
PCP is Rosemarie Ax, MD Referring Provider is Martinique, Peter M, MD  Chief Complaint  Patient presents with  . Routine Post Op    4 week f/u with CXR    HPI: Patient returns for followup of postoperative right pleural effusion after multivessel CABG performed March 2015. The patient was given an extended course of oral Zaroxolyn. X-ray today shows resolution of the right pleural effusion. His lungs are clear. He denies shortness of breath. Is not smoking. The patient is receiving care at the wound center for ischemic toes in his right foot   Past Medical History  Diagnosis Date  . Hypertension   . PAD (peripheral artery disease)   . Gangrene of toe     right 5th/notes 01/04/2013   . High cholesterol   . Type II diabetes mellitus     Past Surgical History  Procedure Laterality Date  . Throat surgery  1983    polyps  . Angioplasty / stenting femoral Right 01/13/2013    superficial femoral artery x 2 (3 mm x 60 mm, 4 mm x 60 mm)  . Tonsillectomy and adenoidectomy  ~ 1965  . Amputation Right 01/28/2013    Procedure: RAY AMPUTATION RIGHT  4th & 5th TOE;  Surgeon: Rosetta Posner, MD;  Location: [facility] OR;  Service: Vascular;  Laterality: Right;  . Coronary artery bypass graft N/A 04/07/2013    Procedure: CORONARY ARTERY BYPASS GRAFTING (CABG);  Surgeon: Ivin Poot, MD;  Location: [place];  Service: Open Heart Surgery;  Laterality: N/A;  . Intraoperative transesophageal echocardiogram N/A 04/07/2013    Procedure: INTRAOPERATIVE TRANSESOPHAGEAL ECHOCARDIOGRAM;  Surgeon: Ivin Poot, MD;  Location: [place];  Service: Open Heart Surgery;  Laterality: N/A;    Family History  Problem Relation Age of Onset  . Cancer Mother     lung cancer  . Hypertension Mother   . Cancer Sister     patient thinks it was uterine cancer  . Hypertension Sister     Social History History  Substance Use Topics  . Smoking status: Former Smoker -- 1.00 packs/day for 44 years    Types: Cigarettes  .  Smokeless tobacco: Never Used  . Alcohol Use: No    Current Outpatient Prescriptions  Medication Sig Dispense Refill  . amLODipine (NORVASC) 10 MG tablet Take 1 tablet (10 mg total) by mouth daily.  90 tablet  1  . antipyrine-benzocaine (AURALGAN) otic solution Place 3 drops into the right ear every 2 (two) hours as needed.  10 mL  0  . aspirin 325 MG EC tablet Take 1 tablet (325 mg total) by mouth daily.  30 tablet  0  . Blood Glucose Monitoring Suppl (ACCU-CHEK AVIVA) device Use as instructed  1 each  0  . cloNIDine (CATAPRES) 0.2 MG tablet Take 1 tablet (0.2 mg total) by mouth 2 (two) times daily.  90 tablet  1  . furosemide (LASIX) 40 MG tablet Take 1 tablet (40 mg total) by mouth daily.  30 tablet  3  . glipiZIDE (GLUCOTROL) 10 MG tablet Take 1 tablet (10 mg total) by mouth 2 (two) times daily before a meal.  90 tablet  1  . glucose blood (ACCU-CHEK AVIVA) test strip Use as instructed  100 each  12  . insulin glargine (LANTUS) 100 units/mL SOLN Inject 30 Units into the skin 2 (two) times daily.  10 mL  0  . Lancet Devices (ACCU-CHEK SOFTCLIX) lancets Use as instructed to test 4 times  a day.  Dx code 250.00.  May change to fit patients meter  100 each  12  . Lancets (ACCU-CHEK SOFT TOUCH) lancets Use as instructed  100 each  12  . lisinopril (PRINIVIL,ZESTRIL) 40 MG tablet Take 1 tablet (40 mg total) by mouth daily.  30 tablet  3  . metFORMIN (GLUCOPHAGE) 1000 MG tablet Take 1 tablet (1,000 mg total) by mouth 2 (two) times daily with a meal.  60 tablet  6  . metoprolol tartrate (LOPRESSOR) 25 MG tablet Take 1 tablet (25 mg total) by mouth 2 (two) times daily.  60 tablet  3  . oxyCODONE (OXY IR/ROXICODONE) 5 MG immediate release tablet Take 1-2 tablets (5-10 mg total) by mouth every 3 (three) hours as needed for moderate pain.  30 tablet  0  . potassium chloride SA (K-DUR,KLOR-CON) 20 MEQ tablet Take 1 tablet (20 mEq total) by mouth daily.  30 tablet  3  . pravastatin (PRAVACHOL) 40 MG  tablet Take 1 tablet (40 mg total) by mouth daily.  90 tablet  1  . silver sulfADIAZINE (SILVADENE) 1 % cream Apply 1 application topically daily.  50 g  2   No current facility-administered medications for this visit.    No Known Allergies  Review of Systems no angina or symptoms of CHF no shortness of breath mild bilateral ankle swelling  BP 146/86  Pulse 83  Resp 20  Ht 5\' 8"  (1.727 m)  Wt 212 lb (96.163 kg)  BMI 32.24 kg/m2  SpO2 96% Physical Exam Alert and comfortable Breath sounds clear Heart rhythm regular Sternal incision is well-healed Patient is wearing a orthopedic boot on his right foot  Diagnostic Tests: Chest x-ray shows resolution right pleural effusion Impression: Now doing well after CABG no further surgical issues  Plan: Continue medications and heart healthy diet and lifestyle. Return here as needed patient may lift more than 20 pounds 3 months after surgery.

## 2013-06-23 LAB — GLUCOSE, CAPILLARY: Glucose-Capillary: 184 mg/dL — ABNORMAL HIGH (ref 70–99)

## 2013-06-24 ENCOUNTER — Telehealth: Payer: Self-pay | Admitting: Clinical

## 2013-06-24 NOTE — Telephone Encounter (Signed)
CSW informed that [employer] has not received PCS form CSW faxed on 4/23 (CSW has confirmation receipt,) CSW faxed it again today and informed Maretha at [facility].  Hunt Oris, MSW, [place]

## 2013-06-25 ENCOUNTER — Other Ambulatory Visit: Payer: Self-pay | Admitting: Family Medicine

## 2013-06-30 LAB — GLUCOSE, CAPILLARY: Glucose-Capillary: 188 mg/dL — ABNORMAL HIGH (ref 70–99)

## 2013-07-01 ENCOUNTER — Ambulatory Visit (INDEPENDENT_AMBULATORY_CARE_PROVIDER_SITE_OTHER): Payer: Medicaid Other | Admitting: Family Medicine

## 2013-07-01 ENCOUNTER — Encounter: Payer: Self-pay | Admitting: Family Medicine

## 2013-07-01 VITALS — BP 160/88 | HR 76 | Temp 98.3°F | Ht 63.0 in | Wt 226.0 lb

## 2013-07-01 DIAGNOSIS — R609 Edema, unspecified: Secondary | ICD-10-CM

## 2013-07-01 DIAGNOSIS — I119 Hypertensive heart disease without heart failure: Secondary | ICD-10-CM

## 2013-07-01 DIAGNOSIS — R600 Localized edema: Secondary | ICD-10-CM

## 2013-07-01 DIAGNOSIS — E119 Type 2 diabetes mellitus without complications: Secondary | ICD-10-CM

## 2013-07-01 LAB — POCT GLYCOSYLATED HEMOGLOBIN (HGB A1C): HEMOGLOBIN A1C: 8.9

## 2013-07-01 MED ORDER — AMLODIPINE BESYLATE 5 MG PO TABS: 5.0000 mg | ORAL_TABLET | Freq: Every day | ORAL | Status: DC

## 2013-07-01 NOTE — Progress Notes (Signed)
Subjective:    Patient ID: Leonard [name], male    DOB: [birth year], 63 y.o.   MRN: [id number]  HPI  Leonard [name] is here for leg swelling.   He presents with leg swelling. The swelling has been worsening.  It is occuring in both legs.  He doesn't know if it's worse at the end of the day.  He denies any shortness of breath, chest pain, and no travel.  He denies any weakness or loss of sensation.  He denies any pain with flexion of his leg.  He is compliant with his medications.    Current Outpatient Prescriptions on File Prior to Visit  Medication Sig Dispense Refill  . antipyrine-benzocaine (AURALGAN) otic solution Place 3 drops into the right ear every 2 (two) hours as needed.  10 mL  0  . aspirin 325 MG EC tablet Take 1 tablet (325 mg total) by mouth daily.  30 tablet  0  . Blood Glucose Monitoring Suppl (ACCU-CHEK AVIVA) device Use as instructed  1 each  0  . cloNIDine (CATAPRES) 0.2 MG tablet Take 1 tablet (0.2 mg total) by mouth 2 (two) times daily.  90 tablet  1  . furosemide (LASIX) 40 MG tablet Take 1 tablet (40 mg total) by mouth daily.  30 tablet  3  . glipiZIDE (GLUCOTROL) 10 MG tablet Take 1 tablet (10 mg total) by mouth 2 (two) times daily before a meal.  90 tablet  1  . glucose blood (ACCU-CHEK AVIVA) test strip Use as instructed  100 each  12  . insulin glargine (LANTUS) 100 units/mL SOLN Inject 30 Units into the skin 2 (two) times daily.  10 mL  0  . Lancet Devices (ACCU-CHEK SOFTCLIX) lancets Use as instructed to test 4 times a day.  Dx code 250.00.  May change to fit patients meter  100 each  12  . Lancets (ACCU-CHEK SOFT TOUCH) lancets Use as instructed  100 each  12  . lisinopril (PRINIVIL,ZESTRIL) 40 MG tablet Take 1 tablet (40 mg total) by mouth daily.  30 tablet  3  . metFORMIN (GLUCOPHAGE) 1000 MG tablet Take 1 tablet (1,000 mg total) by mouth 2 (two) times daily with a meal.  60 tablet  6  . metoprolol tartrate (LOPRESSOR) 25 MG tablet Take 1 tablet (25 mg  total) by mouth 2 (two) times daily.  60 tablet  3  . oxyCODONE (OXY IR/ROXICODONE) 5 MG immediate release tablet Take 1-2 tablets (5-10 mg total) by mouth every 3 (three) hours as needed for moderate pain.  30 tablet  0  . potassium chloride SA (K-DUR,KLOR-CON) 20 MEQ tablet Take 1 tablet (20 mEq total) by mouth daily.  30 tablet  3  . pravastatin (PRAVACHOL) 40 MG tablet Take 1 tablet (40 mg total) by mouth daily.  90 tablet  1  . silver sulfADIAZINE (SILVADENE) 1 % cream Apply 1 application topically daily.  50 g  2   No current facility-administered medications on file prior to visit.    Review of Systems See HPI     Objective:   Physical Exam BP 160/88  Pulse 76  Temp(Src) 98.3 F (36.8 C) (Oral)  Ht 5\' 3"  (1.6 m)  Wt 226 lb (102.513 kg)  BMI 40.04 kg/m2 Gen: NAD, alert, cooperative with exam, well-appearing, African [place] male CV: RRR, good S1/S2, no murmur, no edema, capillary refill brisk  Resp: CTABL, no wheezes, non-labored Ext: +2 swelling b/l to mid tibia, negative homan's sign,  no streaking         Assessment & Plan:

## 2013-07-01 NOTE — Patient Instructions (Signed)
Thank you for coming in,   I'm going to go down on your amlodipine to 5 mg. I hope this helps decrease the swelling in your legs. Let's watch it over the next two weeks.   Please follow up with me in two weeks to see if you swelling is any better, if not then we will try something else.    Please feel free to call with any questions or concerns at any time, at [phone]. --Dr. Raeford Razor  Diet Recommendations for Diabetes   Starchy (carb) foods include: Bread, rice, pasta, potatoes, corn, crackers, bagels, muffins, all baked goods.   Protein foods include: Meat, fish, poultry, eggs, dairy foods, and beans such as pinto and kidney beans (beans also provide carbohydrate).   1. Eat at least 3 meals and 1-2 snacks per day. Never go more than 4-5 hours while awake without eating.  2. Limit starchy foods to TWO per meal and ONE per snack. ONE portion of a starchy  food is equal to the following:   - ONE slice of bread (or its equivalent, such as half of a hamburger bun).   - 1/2 cup of a "scoopable" starchy food such as potatoes or rice.   - 1 OUNCE (28 grams) of starchy snack foods such as crackers or pretzels (look on label).   - 15 grams of carbohydrate as shown on food label.  3. Both lunch and dinner should include a protein food, a carb food, and vegetables.   - Obtain twice as many veg's as protein or carbohydrate foods for both lunch and dinner.   - Try to keep frozen veg's on hand for a quick vegetable serving.     - Fresh or frozen veg's are best.  4. Breakfast should always include protein.

## 2013-07-03 ENCOUNTER — Encounter: Payer: Self-pay | Admitting: Family Medicine

## 2013-07-03 DIAGNOSIS — R600 Localized edema: Secondary | ICD-10-CM | POA: Insufficient documentation

## 2013-07-03 NOTE — Assessment & Plan Note (Signed)
B/l swelling that is worsening. He denies any shortness of breath to suspect worsening CHF or travel to suspect DVT. May be associated with medications or insufficiency.  - decreased amlodipine to 5 mg daily from 10 mg  - elevate legs - if not working then may increase beta blocker.  - discussed with Dr. Lindell Noe

## 2013-07-07 ENCOUNTER — Encounter (HOSPITAL_BASED_OUTPATIENT_CLINIC_OR_DEPARTMENT_OTHER): Payer: Medicaid Other | Attending: Internal Medicine

## 2013-07-07 DIAGNOSIS — E1169 Type 2 diabetes mellitus with other specified complication: Secondary | ICD-10-CM | POA: Insufficient documentation

## 2013-07-07 DIAGNOSIS — L97509 Non-pressure chronic ulcer of other part of unspecified foot with unspecified severity: Secondary | ICD-10-CM | POA: Insufficient documentation

## 2013-07-07 DIAGNOSIS — L84 Corns and callosities: Secondary | ICD-10-CM | POA: Insufficient documentation

## 2013-07-07 LAB — GLUCOSE, CAPILLARY: Glucose-Capillary: 248 mg/dL — ABNORMAL HIGH (ref 70–99)

## 2013-07-13 ENCOUNTER — Other Ambulatory Visit: Payer: Self-pay | Admitting: Family Medicine

## 2013-07-14 LAB — GLUCOSE, CAPILLARY: Glucose-Capillary: 185 mg/dL — ABNORMAL HIGH (ref 70–99)

## 2013-07-16 ENCOUNTER — Other Ambulatory Visit: Payer: Self-pay | Admitting: Family Medicine

## 2013-07-16 DIAGNOSIS — I119 Hypertensive heart disease without heart failure: Secondary | ICD-10-CM

## 2013-07-16 MED ORDER — FUROSEMIDE 40 MG PO TABS: 40.0000 mg | ORAL_TABLET | Freq: Every day | ORAL | Status: DC

## 2013-07-16 NOTE — Telephone Encounter (Signed)
Patient requesting 90 day supply of lasix instead of 30.   Rosemarie Ax, MD PGY-1, [facility] 07/16/2013, 10:14 PM

## 2013-07-18 ENCOUNTER — Other Ambulatory Visit: Payer: Self-pay | Admitting: *Deleted

## 2013-07-18 ENCOUNTER — Other Ambulatory Visit: Payer: Self-pay | Admitting: Physician Assistant

## 2013-07-18 DIAGNOSIS — I119 Hypertensive heart disease without heart failure: Secondary | ICD-10-CM

## 2013-07-21 LAB — GLUCOSE, CAPILLARY: Glucose-Capillary: 248 mg/dL — ABNORMAL HIGH (ref 70–99)

## 2013-07-25 ENCOUNTER — Encounter: Payer: Self-pay | Admitting: *Deleted

## 2013-07-25 ENCOUNTER — Encounter: Payer: Self-pay | Admitting: Family Medicine

## 2013-07-25 ENCOUNTER — Ambulatory Visit (INDEPENDENT_AMBULATORY_CARE_PROVIDER_SITE_OTHER): Payer: Medicaid Other | Admitting: Family Medicine

## 2013-07-25 VITALS — BP 250/112 | HR 82 | Ht 68.0 in | Wt 231.0 lb

## 2013-07-25 DIAGNOSIS — I10 Essential (primary) hypertension: Secondary | ICD-10-CM

## 2013-07-25 DIAGNOSIS — I119 Hypertensive heart disease without heart failure: Secondary | ICD-10-CM

## 2013-07-25 DIAGNOSIS — R609 Edema, unspecified: Secondary | ICD-10-CM

## 2013-07-25 DIAGNOSIS — R600 Localized edema: Secondary | ICD-10-CM

## 2013-07-25 MED ORDER — FUROSEMIDE 40 MG PO TABS: 40.0000 mg | ORAL_TABLET | Freq: Every day | ORAL | Status: DC

## 2013-07-25 MED ORDER — AMLODIPINE BESYLATE 5 MG PO TABS: 5.0000 mg | ORAL_TABLET | Freq: Every day | ORAL | Status: DC

## 2013-07-25 MED ORDER — CLONIDINE HCL 0.2 MG PO TABS: 0.2000 mg | ORAL_TABLET | Freq: Two times a day (BID) | ORAL | Status: DC

## 2013-07-25 MED ORDER — METOPROLOL SUCCINATE ER 100 MG PO TB24: 100.0000 mg | ORAL_TABLET | Freq: Every day | ORAL | Status: DC

## 2013-07-25 NOTE — Progress Notes (Signed)
Subjective:    Patient ID: Leonard [name], male    DOB: [birth year], 63 y.o.   MRN: [id number]  HPI  Leonard [name] is here for f/u HTN and leg swelling.   HTN Home BP Monitoring none Chest pain- no    Dyspnea- no Medications: Amlodipine, Metoprolol, clonidine, lisinopril,  Compliance-  Yes, but instances of noncompliance.  Lightheadedness-  No   Edema- yes    Patient was evaluated for leg swelling on previous visit. At that time he was decreased from 10 mg to 5 mg of amlodipine. He has been compliant with this change. He reports that his legs are still swollen. Upon reviewing of his flow sheet his weight has increased roughly 20 pounds since 06/22/2013. He denies any orthopnea or dyspnea. He ran out of furosemide and hasn't taken it since last week. He denies any pain in his legs. The size of his legs are unchanged since the previous visit.   Current Outpatient Prescriptions on File Prior to Visit  Medication Sig Dispense Refill  . antipyrine-benzocaine (AURALGAN) otic solution Place 3 drops into the right ear every 2 (two) hours as needed.  10 mL  0  . aspirin 325 MG EC tablet Take 1 tablet (325 mg total) by mouth daily.  30 tablet  0  . Blood Glucose Monitoring Suppl (ACCU-CHEK AVIVA) device Use as instructed  1 each  0  . glipiZIDE (GLUCOTROL) 10 MG tablet Take 1 tablet (10 mg total) by mouth 2 (two) times daily before a meal.  90 tablet  1  . glucose blood (ACCU-CHEK AVIVA) test strip Use as instructed  100 each  12  . insulin glargine (LANTUS) 100 units/mL SOLN Inject 30 Units into the skin 2 (two) times daily.  10 mL  0  . Lancet Devices (ACCU-CHEK SOFTCLIX) lancets Use as instructed to test 4 times a day.  Dx code 250.00.  May change to fit patients meter  100 each  12  . Lancets (ACCU-CHEK SOFT TOUCH) lancets Use as instructed  100 each  12  . lisinopril (PRINIVIL,ZESTRIL) 40 MG tablet Take 1 tablet (40 mg total) by mouth daily.  30 tablet  3  . metFORMIN (GLUCOPHAGE) 1000  MG tablet Take 1 tablet (1,000 mg total) by mouth 2 (two) times daily with a meal.  60 tablet  6  . oxyCODONE (OXY IR/ROXICODONE) 5 MG immediate release tablet Take 1-2 tablets (5-10 mg total) by mouth every 3 (three) hours as needed for moderate pain.  30 tablet  0  . potassium chloride SA (K-DUR,KLOR-CON) 20 MEQ tablet Take 1 tablet (20 mEq total) by mouth daily.  30 tablet  3  . pravastatin (PRAVACHOL) 40 MG tablet Take 1 tablet (40 mg total) by mouth daily.  90 tablet  1  . silver sulfADIAZINE (SILVADENE) 1 % cream Apply 1 application topically daily.  50 g  2   No current facility-administered medications on file prior to visit.    Review of Systems See HPI     Objective:   Physical Exam BP 250/112  Pulse 82  Ht 5\' 8"  (1.727 m)  Wt 231 lb (104.781 kg)  BMI 35.13 kg/m2 Gen: NAD, alert, cooperative with exam, well-appearing, African [place] male  CV: RRR, good S1/S2, II/VI murmur,  capillary refill brisk  Resp: CTABL, no wheezes, non-labored Abd: SNTND, BS present, no guarding or organomegaly Ext: +2 pitting edema on left lowe extremity, right foot is placed in CAM walker and cast (placed by  wound center)  **Upon checking his blood pressure with manual and large cuff, still remain elevated (220's/110's) but asymptomatic      Assessment & Plan:

## 2013-07-25 NOTE — Patient Instructions (Signed)
Thank you for coming in,   Please pick up your prescriptions tonight.   1. Take Furosemide 40 mg three times for the next two days.  2. After that, go back to taking it once a day.  3. I have increased your metoprolol to 100 mg once a day.   I have scheduled a follow up appointment with Dr. Valentina Lucks at 2:30 tomorrow. This is to make sure that your blood pressure is improving.    Please feel free to call with any questions or concerns at any time, at [phone]. --Dr. Raeford Razor

## 2013-07-25 NOTE — Assessment & Plan Note (Signed)
Unchanged from previous visit. Most likely related to excess fluid. Hasn't taken furosemide and significant weight gain.  - furosemide 40 mg TID for tonight and tomorrow. Will transition to daily dosing after that  - increased and switched from Metoprolol 25 mg BID to Toprol XL 100 mg daily  - will f/u with Dr. Valentina Lucks tomorrow at 2:30 for med rec  - will need BMP most likely at f/u  - Discussed with Dr. Andria Frames

## 2013-07-25 NOTE — Progress Notes (Signed)
Prior Authorization received from [place] for Metoprolol ER succinate 100 mg tabs. Formulary and PA form placed in Oral [facility] box for completion. Derl Barrow, RN

## 2013-07-25 NOTE — Assessment & Plan Note (Signed)
Uncontrolled. He reports good compliance but pressures are elevated. Asymptomatic. Upon recheck with larger cuff and manual his pressures still elevated  - f/u with Dr. Valentina Lucks tomorrow at 2:30 for med rec  - compliance has been an issue in the past (normal pressures while admitted); will discuss with SW regarding in home help  - changed medications as described in leg swelling plan  - Discussed with Dr. Andria Frames

## 2013-07-26 ENCOUNTER — Encounter: Payer: Self-pay | Admitting: Pharmacist

## 2013-07-26 ENCOUNTER — Ambulatory Visit (INDEPENDENT_AMBULATORY_CARE_PROVIDER_SITE_OTHER): Payer: Medicaid Other | Admitting: Pharmacist

## 2013-07-26 VITALS — BP 190/92 | HR 101 | Ht 70.0 in | Wt 224.5 lb

## 2013-07-26 DIAGNOSIS — E1140 Type 2 diabetes mellitus with diabetic neuropathy, unspecified: Secondary | ICD-10-CM

## 2013-07-26 DIAGNOSIS — E1142 Type 2 diabetes mellitus with diabetic polyneuropathy: Secondary | ICD-10-CM

## 2013-07-26 DIAGNOSIS — I119 Hypertensive heart disease without heart failure: Secondary | ICD-10-CM

## 2013-07-26 DIAGNOSIS — E1149 Type 2 diabetes mellitus with other diabetic neurological complication: Secondary | ICD-10-CM

## 2013-07-26 DIAGNOSIS — I10 Essential (primary) hypertension: Secondary | ICD-10-CM

## 2013-07-26 MED ORDER — CARVEDILOL 25 MG PO TABS: 25.0000 mg | ORAL_TABLET | Freq: Two times a day (BID) | ORAL | Status: DC

## 2013-07-26 MED ORDER — GLIPIZIDE 10 MG PO TABS: 10.0000 mg | ORAL_TABLET | Freq: Two times a day (BID) | ORAL | Status: DC

## 2013-07-26 MED ORDER — FUROSEMIDE 40 MG PO TABS: 40.0000 mg | ORAL_TABLET | Freq: Every day | ORAL | Status: DC

## 2013-07-26 MED ORDER — SPIRONOLACTONE 25 MG PO TABS: 25.0000 mg | ORAL_TABLET | Freq: Every day | ORAL | Status: DC

## 2013-07-26 NOTE — Progress Notes (Signed)
S:    Leonard [name] is a pleasant gentleman arriving in good spirits, walking in with boot on right foot d/t diabetic foot ulcer for wound care. He presents to the clinic for blood pressure evaluation and reports being diagnosed with hypertension since approximately 63 years of age (~46 years).  Medication compliance is self-reported to be adequate, with little to no missed doses.   Current BP Medications include: Amlodopine 5mg  daily Clonidine 0.2 twice daily Furosemide 40mg  daily (but patient has been out of medicine for ~1 week) Lisinopril 40mg  daily metoprolol tartrate 25mg  twice daily (was not able to get XL 100mg  filled d/t insurance prior auth needed)  O:  Exam revealed 2+ pitting edema in left lower extremity Right leg wrapped in boot d/t diabetic ulcer wound care  Today's Office BP readings: Taken sequentially over course of the visit  239/102 mmHg, pulse 101 (automatic reading) 200/92 mmHg (manual reading) 190/92 mmHg (manual reading)  BMET    Component Value Date/Time   NA 143 04/16/2013 1725   K 4.3 04/16/2013 1725   CL 101 04/16/2013 1725   CO2 24 04/16/2013 1725   GLUCOSE 135* 04/16/2013 1725   BUN 20 04/16/2013 1725   CREATININE 1.01 04/16/2013 1725   CREATININE 0.98 11/25/2012 1546   CALCIUM 9.5 04/16/2013 1725   GFRNONAA 78* 04/16/2013 1725   GFRAA >90 04/16/2013 1725    A/P: Patient with resistant hypertension on multiply BP medications and also has 2+ LLE pitting edema.  He reports compliance with current medication, but does report trouble getting furosemide filled at outpatient pharmacy (been ~1 week without).   - stop metoprolol, start carvedilol by mouth 25mg  twice daily - start spironolactone 25mg  by mouth once daily - stop taking potassium supplement - take furosemide 20mg , take 2 tablets by mouth twice daily for 1 day, then 2 tablets once daily until follow-up visit (20mg  samples given in office) - continue amlodipine 5mg  once daily - continue clonidine 0.2mg   by mouth twice daily - f/u Clinic Visit on Thursday 08/04/2013 at 9:30 AM with Dr. Dianah Field - f/u with pharmacy appointment later in July  Results reviewed and written information provided.    Total time in face-to-face counseling 30 minutes.  Patient seen with Juliene Pina,  PharmD Resident; Gwendlyn Deutscher, PharmD Resident.

## 2013-07-26 NOTE — Assessment & Plan Note (Signed)
A/P: Patient with resistant hypertension on multiply BP medications and also has 2+ LLE pitting edema.  He reports compliance with current medication, but does report trouble getting furosemide filled at outpatient pharmacy (been ~1 week without).   - stop metoprolol, start carvedilol by mouth 25mg  twice daily - start spironolactone 25mg  by mouth once daily - stop taking potassium supplement - take furosemide 20mg , take 2 tablets by mouth twice daily for 1 day, then 2 tablets once daily until follow-up visit (20mg  samples given in office) - continue amlodipine 5mg  once daily - continue clonidine 0.2mg  by mouth twice daily - f/u Clinic Visit on Thursday 08/04/2013 at 9:30 AM with Dr. Dianah Field - f/u with pharmacy appointment later in July

## 2013-07-26 NOTE — Patient Instructions (Addendum)
-   stop metoprolol, start carvedilol by mouth 25mg  twice daily - start spironolactone 25mg  by mouth once daily - stop taking Potassium supplement - take furosemide 20mg , take 2 tablets by mouth twice daily for 1 day, then 2 tablets once daily until follow-up visit (20mg  samples given in office) - continue amlodipine 5mg  once daily - continue clonidine 0.2mg  by mouth twice daily - f/u Clinic Visit on Thursday 08/04/2013 at 9:30 AM with Dr. Dianah Field

## 2013-07-26 NOTE — Progress Notes (Signed)
Medication changed the toprol xl from generic to brand per Dr. Raeford Razor.  PA for metoprolol ER voided.  Derl Barrow, RN

## 2013-07-27 ENCOUNTER — Ambulatory Visit (INDEPENDENT_AMBULATORY_CARE_PROVIDER_SITE_OTHER): Payer: Medicaid Other | Admitting: Family Medicine

## 2013-07-27 ENCOUNTER — Encounter: Payer: Self-pay | Admitting: Family Medicine

## 2013-07-27 ENCOUNTER — Telehealth: Payer: Self-pay | Admitting: *Deleted

## 2013-07-27 VITALS — BP 160/90 | HR 98 | Temp 98.1°F | Resp 18 | Wt 222.0 lb

## 2013-07-27 DIAGNOSIS — I10 Essential (primary) hypertension: Secondary | ICD-10-CM

## 2013-07-27 NOTE — Assessment & Plan Note (Addendum)
Very poorly controlled this AM likely due to missing medications. No signs of acute end organ damage  Likely missed his carvedilol given his tachycardia. Emphasized need to arrange medications in box and to take regularly.  Have him follow up to check compliance and response

## 2013-07-27 NOTE — Patient Instructions (Addendum)
Good to see you today!  Thanks for coming in.  Go home and get your pill box and fill with the right medications  Come back on Friday AM to check your blood pressure to Bring all your medications and boxes  Bring all your medications and your pill box when you come to see Dr T. On July 2  If you have chest pain or trouble breathing or severe headache go to the ER

## 2013-07-27 NOTE — Progress Notes (Signed)
Patient ID: Leonard [name], male   DOB: [birth year], 63 y.o.   MRN: [id number] Reviewed: Agree with Dr. Graylin Shiver documentation and management.

## 2013-07-27 NOTE — Progress Notes (Signed)
   Subjective:    Patient ID: Leonard [name], male    DOB: [birth year], 63 y.o.   MRN: [id number]  HPI  HYPERTENSION Disease Monitoring Home BP Monitoring Seen this AM for eye surgery sent here for high blood pressure.  He was feeling well Chest pain- no    Dyspnea- no Medications Compliance-  A bit unsure what or when he took medications this AM .  He is sure he took some and did not take other blood pressure medications. Knows he did not take his diabetes meds. Lightheadedness-  no  Edema- no No visual changes or headache or pain ROS - See HPI  Chief Complaint noted Review of Symptoms - see HPI PMH - Smoking status noted.   His blood pressure has been similarly elevated in past  PMH Lab Review   Potassium  Date Value Ref Range Status  04/16/2013 4.3  3.7 - 5.3 mEq/L Final     Sodium  Date Value Ref Range Status  04/16/2013 143  137 - 147 mEq/L Final     Creat  Date Value Ref Range Status  11/25/2012 0.98  0.50 - 1.35 mg/dL Final     Creatinine, Ser  Date Value Ref Range Status  04/16/2013 1.01  0.50 - 1.35 mg/dL Final           Review of Systems     Objective:   Physical Exam  Alert no acute distress Oriented to place person and time Medications are all heaped in bag.  He can not tell which are for blood pressure or not.    Eye - Pupils Equal Round Reactive to light, Extraocular movements intact, Conjunctiva without redness or discharge Heart - Regular rate and rhythm.  No murmurs, gallops or rubs.    Lungs:  Normal respiratory effort, chest expands symmetrically. Lungs are clear to auscultation, no crackles or wheezes. Extrem no edema on left R in boot Finger to nose, Strength intact  Initial blood pressure on rooming 239/128 Gave him Clonidiine 0.2 mg and Carvedilol 25 mg at 935 AM.  Blood pressure reduced after 30 minutes - see VS      Assessment & Plan:

## 2013-07-27 NOTE — Telephone Encounter (Signed)
Received call this morning stating pt was going to have cataract surgery but it will be delayed due to pt blood pressure reading 234/114, pt complained of headache and dizziness.  Appt scheduled for today at 9:15 AM 07/27/2013.   Derl Barrow, RN

## 2013-07-28 ENCOUNTER — Encounter: Payer: Self-pay | Admitting: Clinical

## 2013-07-28 NOTE — Progress Notes (Signed)
CSW contacted [employer] and was informed that pt was denied services because a small portion was not completed on the application. CSW will place PCS application in PCP box for completion.  Hunt Oris, MSW, [place]

## 2013-07-29 ENCOUNTER — Encounter: Payer: Self-pay | Admitting: Family Medicine

## 2013-07-29 ENCOUNTER — Ambulatory Visit (INDEPENDENT_AMBULATORY_CARE_PROVIDER_SITE_OTHER): Payer: Medicaid Other | Admitting: Family Medicine

## 2013-07-29 VITALS — BP 160/98 | HR 80 | Ht 70.0 in | Wt 213.0 lb

## 2013-07-29 DIAGNOSIS — I10 Essential (primary) hypertension: Secondary | ICD-10-CM

## 2013-07-29 MED ORDER — FUROSEMIDE 40 MG PO TABS: 40.0000 mg | ORAL_TABLET | Freq: Every day | ORAL | Status: DC

## 2013-07-29 MED ORDER — AMLODIPINE BESYLATE 5 MG PO TABS: 5.0000 mg | ORAL_TABLET | Freq: Every day | ORAL | Status: DC

## 2013-07-29 MED ORDER — PRAVASTATIN SODIUM 40 MG PO TABS: 40.0000 mg | ORAL_TABLET | Freq: Every day | ORAL | Status: DC

## 2013-07-29 NOTE — Progress Notes (Signed)
Leonard [name] is a 63 y.o. male who presents to [facility] today for BP f/u  BP: Ongoing medical concern for patient. Seen on 6/24 by Dr. Erin Hearing for HTN. Note reviewed in full. H/o compliance issues that do not seem to be intentional but due to forget fullness.  Since last appt. Pt deneis CP, palpitations, HA, SOB, lightheadedness, syncope.  Brought in pill box today.  Currently taking Metoprolol, lasix, furosemide, clonidine, lisinopril, carvedilol, amlodipine.  The following portions of the patient's history were reviewed and updated as appropriate: allergies, current medications, past medical history, family and social history, and problem list.  Patient is a nonsmoker  Past Medical History  Diagnosis Date  . Hypertension   . PAD (peripheral artery disease)   . Gangrene of toe     right 5th/notes 01/04/2013   . High cholesterol   . Type II diabetes mellitus     ROS as above otherwise neg.    Medications reviewed. Current Outpatient Prescriptions  Medication Sig Dispense Refill  . amLODipine (NORVASC) 5 MG tablet Take 1 tablet (5 mg total) by mouth daily.  90 tablet  1  . aspirin 325 MG EC tablet Take 1 tablet (325 mg total) by mouth daily.  30 tablet  0  . Blood Glucose Monitoring Suppl (ACCU-CHEK AVIVA) device Use as instructed  1 each  0  . carvedilol (COREG) 25 MG tablet Take 1 tablet (25 mg total) by mouth 2 (two) times daily with a meal.  60 tablet  3  . cloNIDine (CATAPRES) 0.2 MG tablet Take 1 tablet (0.2 mg total) by mouth 2 (two) times daily.  90 tablet  1  . furosemide (LASIX) 40 MG tablet Take 1 tablet (40 mg total) by mouth daily.  90 tablet  1  . glipiZIDE (GLUCOTROL) 10 MG tablet Take 1 tablet (10 mg total) by mouth 2 (two) times daily before a meal.  90 tablet  1  . glucose blood (ACCU-CHEK AVIVA) test strip Use as instructed  100 each  12  . insulin glargine (LANTUS) 100 units/mL SOLN Inject 30 Units into the skin 2 (two) times daily.  10 mL  0  . Lancet Devices  (ACCU-CHEK SOFTCLIX) lancets Use as instructed to test 4 times a day.  Dx code 250.00.  May change to fit patients meter  100 each  12  . Lancets (ACCU-CHEK SOFT TOUCH) lancets Use as instructed  100 each  12  . lisinopril (PRINIVIL,ZESTRIL) 40 MG tablet Take 1 tablet (40 mg total) by mouth daily.  30 tablet  3  . metFORMIN (GLUCOPHAGE) 1000 MG tablet Take 1 tablet (1,000 mg total) by mouth 2 (two) times daily with a meal.  60 tablet  6  . oxyCODONE (OXY IR/ROXICODONE) 5 MG immediate release tablet Take 1-2 tablets (5-10 mg total) by mouth every 3 (three) hours as needed for moderate pain.  30 tablet  0  . pravastatin (PRAVACHOL) 40 MG tablet Take 1 tablet (40 mg total) by mouth daily.  90 tablet  1  . silver sulfADIAZINE (SILVADENE) 1 % cream Apply 1 application topically once a week. Gets applied at wound care      . spironolactone (ALDACTONE) 25 MG tablet Take 1 tablet (25 mg total) by mouth daily.  30 tablet  1   No current facility-administered medications for this visit.    Exam:  BP 160/98  Pulse 80  Ht 5\' 10"  (1.778 m)  Wt 213 lb (96.616 kg)  BMI 30.56 kg/m2  Gen: Well NAD HEENT: EOMI,  MMM Lungs: CTABL Nl WOB Heart: RRR no MRG Abd: NABS, NT, ND    No results found for this or any previous visit (from the past 72 hour(s)).  A/P (as seen in Problem list)  Essential hypertension, benign Pt has seen 3 providers in the recent past who have made various BP medication changes.  Pt currently taking 2 BBlockers (carvedilol and Metoprolol).  Pt attended today by sister. Pill box interogated and pt taking 7 medications as listed in chart.  Pt did not stop metoprolol as discussed by Dr. Valentina Lucks Pt asymptomatic  Pt metoprolol taken from pill box Encouraged to only see on Toria Monte for his BP - preferably Raeford Razor or [facility] Pts meds refilled   spent > 25 min in direct pt care and counseling

## 2013-07-29 NOTE — Patient Instructions (Signed)
You are doing very well overall. Please follow up with Dr. Raeford Razor for your blood pressure  Stop taking the metoprolol Come in or go to the emergency room immediately if you develop chest pain

## 2013-07-29 NOTE — Assessment & Plan Note (Signed)
Pt has seen 3 providers in the recent past who have made various BP medication changes.  Pt currently taking 2 BBlockers (carvedilol and Metoprolol).  Pt attended today by sister. Pill box interogated and pt taking 7 medications as listed in chart.  Pt did not stop metoprolol as discussed by Dr. Valentina Lucks Pt asymptomatic  Pt metoprolol taken from pill box Encouraged to only see on provider for his BP - preferably Raeford Razor or [facility] Pts meds refilled

## 2013-08-04 ENCOUNTER — Ambulatory Visit (INDEPENDENT_AMBULATORY_CARE_PROVIDER_SITE_OTHER): Payer: Medicaid Other | Admitting: Family Medicine

## 2013-08-04 ENCOUNTER — Ambulatory Visit: Payer: Medicaid Other | Admitting: Family Medicine

## 2013-08-04 VITALS — BP 198/100 | HR 88 | Wt 224.0 lb

## 2013-08-04 DIAGNOSIS — E1142 Type 2 diabetes mellitus with diabetic polyneuropathy: Secondary | ICD-10-CM

## 2013-08-04 DIAGNOSIS — E1149 Type 2 diabetes mellitus with other diabetic neurological complication: Secondary | ICD-10-CM

## 2013-08-04 DIAGNOSIS — L84 Corns and callosities: Secondary | ICD-10-CM | POA: Insufficient documentation

## 2013-08-04 DIAGNOSIS — I10 Essential (primary) hypertension: Secondary | ICD-10-CM

## 2013-08-04 DIAGNOSIS — E1140 Type 2 diabetes mellitus with diabetic neuropathy, unspecified: Secondary | ICD-10-CM

## 2013-08-04 LAB — BASIC METABOLIC PANEL
BUN: 23 mg/dL (ref 6–23)
CO2: 24 mEq/L (ref 19–32)
CREATININE: 0.93 mg/dL (ref 0.50–1.35)
Calcium: 9.4 mg/dL (ref 8.4–10.5)
Chloride: 106 mEq/L (ref 96–112)
GLUCOSE: 218 mg/dL — AB (ref 70–99)
POTASSIUM: 4.2 meq/L (ref 3.5–5.3)
Sodium: 141 mEq/L (ref 135–145)

## 2013-08-04 MED ORDER — AMLODIPINE BESYLATE 10 MG PO TABS: 10.0000 mg | ORAL_TABLET | Freq: Every day | ORAL | Status: DC

## 2013-08-04 MED ORDER — INSULIN GLARGINE 100 UNITS/ML SOLOSTAR PEN: 30.0000 [IU] | PEN_INJECTOR | Freq: Two times a day (BID) | SUBCUTANEOUS | Status: DC

## 2013-08-04 NOTE — Assessment & Plan Note (Signed)
Still no where near goal. Reports taking medications as prescribed. With recent increase in blood pressure and the number of medications the patient is on this begs the question of a secondary cause. Will check a BMET today to evaluate for kidney dysfunction. Will increase his amlodipine to 10 mg daily. Continue other BP medications as they currently are. F/u in one week. Given return precautions.

## 2013-08-04 NOTE — Progress Notes (Signed)
Patient ID: Leonard [name], male   DOB: September 07, 1950, 63 y.o.   MRN: [id number]  Leonard Rumps, MD Phone: [phone]  Leonard [name] is a 63 y.o. male who presents today for f/u.  HYPERTENSION Disease Monitoring Home BP Monitoring not checking at home, does not have a monitor Chest pain- no    Dyspnea- no Medications Compliance-  taking. Headache- no  Edema- minimal swelling Patient endorses taking all of his medications today. He denies NSAID use or medications not listed on med list.  Right foot callus: patient with callus on the ball of his right foot. He has seen the wound center for this previously and they prescribed diabetic shoes and advised to get Dr Pearson Grippe pads for this area. He denies redness or drainage from this area. 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  Patient reports taking all his medications as outline in the med rec.    ROS: Per HPI   Physical Exam Filed Vitals:   08/04/13 1518  BP: 198/100  Pulse: 88    Gen: Well NAD HEENT: PERRL Lungs: CTABL Nl WOB Heart: RRR no MRG Skin: bottom of his right foot over the medial ball of his foot is a quarter sized callus that appears to have been scraped away, no erythema or swelling or drainage Exts: trace BL  LE, warm and well perfused.    Assessment/Plan: Please see individual problem list.  # Healthcare maintenance: not addressed

## 2013-08-04 NOTE — Patient Instructions (Signed)
Nice to meet you. We are going to increase the dose of your amlodipine to 10 mg daily. You can take 2 tablets of your current prescription. When you run out of these you can go pick up the new prescription. We will call with the results of your blood work. If you develop chest pain, shortness of breath, or headache please seek medical attention.

## 2013-08-04 NOTE — Assessment & Plan Note (Signed)
Noted on ball of right foot. No signs of infection. Patient is to get diabetic shoes to better protect his feet. Discussed things to look for to seek medical attention. Would likely benefit from seeing a podiatrist in the future.

## 2013-08-07 ENCOUNTER — Encounter: Payer: Self-pay | Admitting: Family Medicine

## 2013-08-11 ENCOUNTER — Encounter: Payer: Self-pay | Admitting: Family Medicine

## 2013-08-11 ENCOUNTER — Other Ambulatory Visit: Payer: Self-pay | Admitting: Family Medicine

## 2013-08-11 ENCOUNTER — Ambulatory Visit (INDEPENDENT_AMBULATORY_CARE_PROVIDER_SITE_OTHER): Payer: Medicaid Other | Admitting: Family Medicine

## 2013-08-11 VITALS — BP 210/110 | HR 84 | Temp 98.3°F | Ht 70.0 in | Wt 217.0 lb

## 2013-08-11 DIAGNOSIS — L97509 Non-pressure chronic ulcer of other part of unspecified foot with unspecified severity: Secondary | ICD-10-CM

## 2013-08-11 DIAGNOSIS — E1169 Type 2 diabetes mellitus with other specified complication: Secondary | ICD-10-CM

## 2013-08-11 DIAGNOSIS — I10 Essential (primary) hypertension: Secondary | ICD-10-CM

## 2013-08-11 DIAGNOSIS — E11621 Type 2 diabetes mellitus with foot ulcer: Secondary | ICD-10-CM

## 2013-08-11 DIAGNOSIS — E039 Hypothyroidism, unspecified: Secondary | ICD-10-CM

## 2013-08-11 LAB — TSH: TSH: 5.292 u[IU]/mL — AB (ref 0.350–4.500)

## 2013-08-11 NOTE — Progress Notes (Signed)
Subjective:    Patient ID: Leonard [name], male    DOB: [birth year], 63 y.o.   MRN: [id number]  HPI  Leonard [name] is here for HTN f/u.  HTN Disease Monitoring:200/100 Home BP Monitoring none Chest pain-  no   Dyspnea- no Medications:Amlodipine, Carvedilol, spironolactone, clonidine, lisinopril  Compliance-  Reports to taking them this morning. His neighbor currently fills his pill box. He doesn't . Lightheadedness-  no Edema- no    CHRONIC DIABETES  Disease Monitoring  Blood Sugar Ranges: 200's   Polyuria: no   Visual problems: no. Awaiting cataract surgery on right eye.   Medication Compliance: yes, but doesn't check his blood sugars.   Medication Side Effects  Hypoglycemia: no   Preventitive Health Care  Eye Exam: Recently went in June   Foot Exam: performed today   Diet pattern: Eating balanced but still eats out.   Exercise: Walking     Current Outpatient Prescriptions on File Prior to Visit  Medication Sig Dispense Refill  . amLODipine (NORVASC) 10 MG tablet Take 1 tablet (10 mg total) by mouth daily.  90 tablet  1  . aspirin 325 MG EC tablet Take 1 tablet (325 mg total) by mouth daily.  30 tablet  0  . Blood Glucose Monitoring Suppl (ACCU-CHEK AVIVA) device Use as instructed  1 each  0  . carvedilol (COREG) 25 MG tablet Take 1 tablet (25 mg total) by mouth 2 (two) times daily with a meal.  60 tablet  3  . cloNIDine (CATAPRES) 0.2 MG tablet Take 1 tablet (0.2 mg total) by mouth 2 (two) times daily.  90 tablet  1  . furosemide (LASIX) 40 MG tablet Take 1 tablet (40 mg total) by mouth daily.  90 tablet  1  . glipiZIDE (GLUCOTROL) 10 MG tablet Take 1 tablet (10 mg total) by mouth 2 (two) times daily before a meal.  90 tablet  1  . glucose blood (ACCU-CHEK AVIVA) test strip Use as instructed  100 each  12  . insulin glargine (LANTUS) 100 unit/mL SOPN Inject 0.3 mLs (30 Units total) into the skin 2 (two) times daily.  10 mL  3  . Lancet Devices (ACCU-CHEK SOFTCLIX)  lancets Use as instructed to test 4 times a day.  Dx code 250.00.  May change to fit patients meter  100 each  12  . Lancets (ACCU-CHEK SOFT TOUCH) lancets Use as instructed  100 each  12  . lisinopril (PRINIVIL,ZESTRIL) 40 MG tablet Take 1 tablet (40 mg total) by mouth daily.  30 tablet  3  . metFORMIN (GLUCOPHAGE) 1000 MG tablet Take 1 tablet (1,000 mg total) by mouth 2 (two) times daily with a meal.  60 tablet  6  . oxyCODONE (OXY IR/ROXICODONE) 5 MG immediate release tablet Take 1-2 tablets (5-10 mg total) by mouth every 3 (three) hours as needed for moderate pain.  30 tablet  0  . pravastatin (PRAVACHOL) 40 MG tablet Take 1 tablet (40 mg total) by mouth daily.  90 tablet  1  . silver sulfADIAZINE (SILVADENE) 1 % cream Apply 1 application topically once a week. Gets applied at wound care      . spironolactone (ALDACTONE) 25 MG tablet Take 1 tablet (25 mg total) by mouth daily.  30 tablet  1   No current facility-administered medications on file prior to visit.    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   Foot exam: documented in EPIC   Recheck BP 190/90.      Assessment & Plan:

## 2013-08-11 NOTE — Assessment & Plan Note (Signed)
Doesn't check sugars on a regular basis. Awaiting approval for an in home aide to help with medication delivery and compliance. Compliance has been an issue. Sugars have been elevated on CMP's in clinic. Continuing current medications as unsure of his sugars.  - Encourage to check sugars.  - Will increase insulin based on his sugars if he brings them  - will f/u with me in two weeks.

## 2013-08-11 NOTE — Assessment & Plan Note (Addendum)
Uncontrolled. remeasured in clinic to be 190/90. Currently asymptomatic. Most recent BMP within normal range.  I feel that most likely his uncontrolled blood pressures related to his extensive medication list. I have filled out paperwork for an in-home aide and is pending its approval. - Currently taking:  - Amlodipine 10 mg, carvedilol 25 mg twice a day, spironolactone 25 mg daily, clonidine 0.2 mg BID, lisinopril 40 mg daily   - he had a 90 tablet prescription for amlodipine 10 mg daily back in April as to why his insurance didn't fill the 10 mg amlodpine script sent by Dr. Caryl Bis. He should still have that prescription unless it was thrown away. We'll start workup for any causes of secondary hypertension. - TSH: Pending - Need to determine any signs of sleep apnea with possible sleep study in the future. - Renal ultrasound is only ordered if there is a possibility of surgery or the patient is deteriorating clinically. - Discussed with Dr. Mingo Amber.  - f/u with Dr. Valentina Lucks next week and myself the week after.

## 2013-08-11 NOTE — Patient Instructions (Signed)
Thank you for coming in,   Please schedule an appointment with Dr. Valentina Lucks for next week. This appointment is for medication reconciliation. Please test your sugars and brings these measurements to this appt.   Please schedule an appointment with me in two weeks.    Please feel free to call with any questions or concerns at any time, at [phone]. --Dr. Raeford Razor  Diet Recommendations for Diabetes   Starchy (carb) foods include: Bread, rice, pasta, potatoes, corn, crackers, bagels, muffins, all baked goods.   Protein foods include: Meat, fish, poultry, eggs, dairy foods, and beans such as pinto and kidney beans (beans also provide carbohydrate).   1. Eat at least 3 meals and 1-2 snacks per day. Never go more than 4-5 hours while awake without eating.  2. Limit starchy foods to TWO per meal and ONE per snack. ONE portion of a starchy  food is equal to the following:   - ONE slice of bread (or its equivalent, such as half of a hamburger bun).   - 1/2 cup of a "scoopable" starchy food such as potatoes or rice.   - 1 OUNCE (28 grams) of starchy snack foods such as crackers or pretzels (look on label).   - 15 grams of carbohydrate as shown on food label.  3. Both lunch and dinner should include a protein food, a carb food, and vegetables.   - Obtain twice as many veg's as protein or carbohydrate foods for both lunch and dinner.   - Try to keep frozen veg's on hand for a quick vegetable serving.     - Fresh or frozen veg's are best.  4. Breakfast should always include protein.    DASH Eating Plan DASH stands for "Dietary Approaches to Stop Hypertension." The DASH eating plan is a healthy eating plan that has been shown to reduce high blood pressure (hypertension). Additional health benefits may include reducing the risk of type 2 diabetes mellitus, heart disease, and stroke. The DASH eating plan may also help with weight loss. WHAT DO I NEED TO KNOW ABOUT THE DASH EATING PLAN? For the DASH eating  plan, you will follow these general guidelines:  Choose foods with a percent daily value for sodium of less than 5% (as listed on the food label).  Use salt-free seasonings or herbs instead of table salt or sea salt.  Check with your health care provider or pharmacist before using salt substitutes.  Eat lower-sodium products, often labeled as "lower sodium" or "no salt added."  Eat fresh foods.  Eat more vegetables, fruits, and low-fat dairy products.  Choose whole grains. Look for the word "whole" as the first word in the ingredient list.  Choose fish and skinless chicken or [place] more often than red meat. Limit fish, poultry, and meat to 6 oz (170 g) each day.  Limit sweets, desserts, sugars, and sugary drinks.  Choose heart-healthy fats.  Limit cheese to 1 oz (28 g) per day.  Eat more home-cooked food and less restaurant, buffet, and fast food.  Limit fried foods.  Cook foods using methods other than frying.  Limit canned vegetables. If you do use them, rinse them well to decrease the sodium.  When eating at a restaurant, ask that your food be prepared with less salt, or no salt if possible. WHAT FOODS CAN I EAT? Seek help from a dietitian for individual calorie needs. Grains Whole grain or whole wheat bread. Brown rice. Whole grain or whole wheat pasta. Quinoa, bulgur, and whole  grain cereals. Low-sodium cereals. Corn or whole wheat flour tortillas. Whole grain cornbread. Whole grain crackers. Low-sodium crackers. Vegetables Fresh or frozen vegetables (raw, steamed, roasted, or grilled). Low-sodium or reduced-sodium tomato and vegetable juices. Low-sodium or reduced-sodium tomato sauce and paste. Low-sodium or reduced-sodium canned vegetables.  Fruits All fresh, canned (in natural juice), or frozen fruits. Meat and Other Protein Products Ground beef (85% or leaner), grass-fed beef, or beef trimmed of fat. Skinless chicken or [place]. Ground chicken or [place]. Pork  trimmed of fat. All fish and seafood. Eggs. Dried beans, peas, or lentils. Unsalted nuts and seeds. Unsalted canned beans. Dairy Low-fat dairy products, such as skim or 1% milk, 2% or reduced-fat cheeses, low-fat ricotta or cottage cheese, or plain low-fat yogurt. Low-sodium or reduced-sodium cheeses. Fats and Oils Tub margarines without trans fats. Light or reduced-fat mayonnaise and salad dressings (reduced sodium). Avocado. Safflower, olive, or canola oils. Natural peanut or almond butter. Other Unsalted popcorn and pretzels. The items listed above may not be a complete list of recommended foods or beverages. Contact your dietitian for more options. WHAT FOODS ARE NOT RECOMMENDED? Grains White bread. White pasta. White rice. Refined cornbread. Bagels and croissants. Crackers that contain trans fat. Vegetables Creamed or fried vegetables. Vegetables in a cheese sauce. Regular canned vegetables. Regular canned tomato sauce and paste. Regular tomato and vegetable juices. Fruits Dried fruits. Canned fruit in light or heavy syrup. Fruit juice. Meat and Other Protein Products Fatty cuts of meat. Ribs, chicken wings, bacon, sausage, bologna, salami, chitterlings, fatback, hot dogs, bratwurst, and packaged luncheon meats. Salted nuts and seeds. Canned beans with salt. Dairy Whole or 2% milk, cream, half-and-half, and cream cheese. Whole-fat or sweetened yogurt. Full-fat cheeses or blue cheese. Nondairy creamers and whipped toppings. Processed cheese, cheese spreads, or cheese curds. Condiments Onion and garlic salt, seasoned salt, table salt, and sea salt. Canned and packaged gravies. Worcestershire sauce. Tartar sauce. Barbecue sauce. Teriyaki sauce. Soy sauce, including reduced sodium. Steak sauce. Fish sauce. Oyster sauce. Cocktail sauce. Horseradish. Ketchup and mustard. Meat flavorings and tenderizers. Bouillon cubes. Hot sauce. Tabasco sauce. Marinades. Taco seasonings. Relishes. Fats and  Oils Butter, stick margarine, lard, shortening, ghee, and bacon fat. Coconut, palm kernel, or palm oils. Regular salad dressings. Other Pickles and olives. Salted popcorn and pretzels. The items listed above may not be a complete list of foods and beverages to avoid. Contact your dietitian for more information. WHERE CAN I FIND MORE INFORMATION? National Heart, Lung, and Blood Institute: travelstabloid.com Document Released: 01/09/2011 Document Revised: 01/25/2013 Document Reviewed: 11/24/2012 [facility] Patient Information 2015 [place], [place]. This information is not intended to replace advice given to you by your health care provider. Make sure you discuss any questions you have with your health care provider.

## 2013-08-12 LAB — T4, FREE: Free T4: 1.22 ng/dL (ref 0.80–1.80)

## 2013-08-12 NOTE — Addendum Note (Signed)
Addended by: Clearance Coots E on: 08/12/2013 11:10 AM   Modules accepted: Orders

## 2013-08-15 ENCOUNTER — Telehealth: Payer: Self-pay | Admitting: *Deleted

## 2013-08-15 NOTE — Telephone Encounter (Signed)
Message copied by Corinna Capra on Mon Aug 15, 2013  9:23 AM ------      Message from: Rosemarie Ax      Created: Sun Aug 14, 2013  6:08 PM       Please call patient and inform that he was found to be hypothyroid but not significant enough to start any medication. Will continue to monitor. Thank you. ------

## 2013-08-15 NOTE — Telephone Encounter (Signed)
Relayed message,patient voiced understanding. Nelson Noone S  

## 2013-08-23 ENCOUNTER — Ambulatory Visit (INDEPENDENT_AMBULATORY_CARE_PROVIDER_SITE_OTHER): Payer: Medicaid Other | Admitting: Pharmacist

## 2013-08-23 ENCOUNTER — Encounter: Payer: Self-pay | Admitting: Family Medicine

## 2013-08-23 ENCOUNTER — Ambulatory Visit (INDEPENDENT_AMBULATORY_CARE_PROVIDER_SITE_OTHER): Payer: Medicaid Other | Admitting: Family Medicine

## 2013-08-23 ENCOUNTER — Encounter: Payer: Self-pay | Admitting: Pharmacist

## 2013-08-23 VITALS — BP 206/92 | HR 87 | Ht 70.0 in | Wt 221.0 lb

## 2013-08-23 DIAGNOSIS — I10 Essential (primary) hypertension: Secondary | ICD-10-CM

## 2013-08-23 DIAGNOSIS — Z87891 Personal history of nicotine dependence: Secondary | ICD-10-CM

## 2013-08-23 DIAGNOSIS — E1129 Type 2 diabetes mellitus with other diabetic kidney complication: Secondary | ICD-10-CM

## 2013-08-23 DIAGNOSIS — E1142 Type 2 diabetes mellitus with diabetic polyneuropathy: Secondary | ICD-10-CM

## 2013-08-23 DIAGNOSIS — I119 Hypertensive heart disease without heart failure: Secondary | ICD-10-CM

## 2013-08-23 DIAGNOSIS — E1149 Type 2 diabetes mellitus with other diabetic neurological complication: Secondary | ICD-10-CM

## 2013-08-23 DIAGNOSIS — N058 Unspecified nephritic syndrome with other morphologic changes: Secondary | ICD-10-CM

## 2013-08-23 DIAGNOSIS — E1121 Type 2 diabetes mellitus with diabetic nephropathy: Secondary | ICD-10-CM

## 2013-08-23 DIAGNOSIS — E1140 Type 2 diabetes mellitus with diabetic neuropathy, unspecified: Secondary | ICD-10-CM

## 2013-08-23 HISTORY — DX: Personal history of nicotine dependence: Z87.891

## 2013-08-23 MED ORDER — SPIRONOLACTONE 50 MG PO TABS: 50.0000 mg | ORAL_TABLET | Freq: Every day | ORAL | Status: DC

## 2013-08-23 MED ORDER — GLIPIZIDE 10 MG PO TABS: 10.0000 mg | ORAL_TABLET | Freq: Two times a day (BID) | ORAL | Status: DC

## 2013-08-23 MED ORDER — INSULIN GLARGINE 100 UNITS/ML SOLOSTAR PEN: 35.0000 [IU] | PEN_INJECTOR | Freq: Two times a day (BID) | SUBCUTANEOUS | Status: DC

## 2013-08-23 MED ORDER — METFORMIN HCL 1000 MG PO TABS: 1000.0000 mg | ORAL_TABLET | Freq: Two times a day (BID) | ORAL | Status: DC

## 2013-08-23 MED ORDER — CARVEDILOL 25 MG PO TABS: 50.0000 mg | ORAL_TABLET | Freq: Two times a day (BID) | ORAL | Status: DC

## 2013-08-23 MED ORDER — AMLODIPINE BESYLATE 10 MG PO TABS: 10.0000 mg | ORAL_TABLET | Freq: Every day | ORAL | Status: DC

## 2013-08-23 MED ORDER — PRAVASTATIN SODIUM 40 MG PO TABS: 40.0000 mg | ORAL_TABLET | Freq: Every day | ORAL | Status: DC

## 2013-08-23 MED ORDER — LISINOPRIL 40 MG PO TABS: 40.0000 mg | ORAL_TABLET | Freq: Every day | ORAL | Status: DC

## 2013-08-23 MED ORDER — HYDROCHLOROTHIAZIDE 25 MG PO TABS: 12.5000 mg | ORAL_TABLET | Freq: Every day | ORAL | Status: DC

## 2013-08-23 MED ORDER — FUROSEMIDE 20 MG PO TABS: 20.0000 mg | ORAL_TABLET | Freq: Every day | ORAL | Status: DC

## 2013-08-23 NOTE — Patient Instructions (Signed)
Thank you for coming in,   We made the following changes today.  Decreased lasix 20 mg daily  Added HCTZ 12.5 mg daily  Increased Carvedilol to 50 mg twice a day  Increased Spironolactone 50 mg daily   Increased Lantus to 35 units Twice a day.   Follow up with Dr. Valentina Lucks in 2 week and see me in one month.   I am ordering a sleep study to see if you have obstructive sleep apnea.    Please feel free to call with any questions or concerns at any time, at [phone]. --Dr. Raeford Razor  Sleep Apnea  Sleep apnea is a sleep disorder characterized by abnormal pauses in breathing while you sleep. When your breathing pauses, the level of oxygen in your blood decreases. This causes you to move out of deep sleep and into light sleep. As a result, your quality of sleep is poor, and the system that carries your blood throughout your body (cardiovascular system) experiences stress. If sleep apnea remains untreated, the following conditions can develop:  High blood pressure (hypertension).  Coronary artery disease.  Inability to achieve or maintain an erection (impotence).  Impairment of your thought process (cognitive dysfunction). There are three types of sleep apnea: 1. Obstructive sleep apnea--Pauses in breathing during sleep because of a blocked airway. 2. Central sleep apnea--Pauses in breathing during sleep because the area of the brain that controls your breathing does not send the correct signals to the muscles that control breathing. 3. Mixed sleep apnea--A combination of both obstructive and central sleep apnea. RISK FACTORS The following risk factors can increase your risk of developing sleep apnea:  Being overweight.  Smoking.  Having narrow passages in your nose and throat.  Being of older age.  Being male.  Alcohol use.  Sedative and tranquilizer use.  Ethnicity. Among individuals younger than 35 years, African Americans are at increased risk of sleep apnea. SYMPTOMS    Difficulty staying asleep.  Daytime sleepiness and fatigue.  Loss of energy.  Irritability.  Loud, heavy snoring.  Morning headaches.  Trouble concentrating.  Forgetfulness.  Decreased interest in sex. DIAGNOSIS  In order to diagnose sleep apnea, your caregiver will perform a physical examination. Your caregiver may suggest that you take a home sleep test. Your caregiver may also recommend that you spend the night in a sleep lab. In the sleep lab, several monitors record information about your heart, lungs, and brain while you sleep. Your leg and arm movements and blood oxygen level are also recorded. TREATMENT The following actions may help to resolve mild sleep apnea:  Sleeping on your side.   Using a decongestant if you have nasal congestion.   Avoiding the use of depressants, including alcohol, sedatives, and narcotics.   Losing weight and modifying your diet if you are overweight. There also are devices and treatments to help open your airway:  Oral appliances. These are custom-made mouthpieces that shift your lower jaw forward and slightly open your bite. This opens your airway.  Devices that create positive airway pressure. This positive pressure "splints" your airway open to help you breathe better during sleep. The following devices create positive airway pressure:  Continuous positive airway pressure (CPAP) device. The CPAP device creates a continuous level of air pressure with an air pump. The air is delivered to your airway through a mask while you sleep. This continuous pressure keeps your airway open.  Nasal expiratory positive airway pressure (EPAP) device. The EPAP device creates positive air pressure as  you exhale. The device consists of single-use valves, which are inserted into each nostril and held in place by adhesive. The valves create very little resistance when you inhale but create much more resistance when you exhale. That increased resistance  creates the positive airway pressure. This positive pressure while you exhale keeps your airway open, making it easier to breath when you inhale again.  Bilevel positive airway pressure (BPAP) device. The BPAP device is used mainly in patients with central sleep apnea. This device is similar to the CPAP device because it also uses an air pump to deliver continuous air pressure through a mask. However, with the BPAP machine, the pressure is set at two different levels. The pressure when you exhale is lower than the pressure when you inhale.  Surgery. Typically, surgery is only done if you cannot comply with less invasive treatments or if the less invasive treatments do not improve your condition. Surgery involves removing excess tissue in your airway to create a wider passage way. Document Released: 01/10/2002 Document Revised: 05/17/2012 Document Reviewed: 05/29/2011 [facility] Patient Information 2015 [place], [place]. This information is not intended to replace advice given to you by your health care provider. Make sure you discuss any questions you have with your health care provider.

## 2013-08-23 NOTE — Assessment & Plan Note (Signed)
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   He has recently attempt to cut out "chip" and has noticed minimal improvement in BP control.   Changes to medications  Deferred to Dr. Raeford Razor.  Results reviewed and written information provided. Total time in face-to-face counseling 10 minutes.

## 2013-08-23 NOTE — Progress Notes (Signed)
S:    Patient arrives acocmpanied by his sister [name].   He is happy, conversant and reports improved wound healing of his foot.      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   He has recently attempt to cut out "chip" and has noticed minimal improvement in BP control.   Changes to medications  Deferred to Dr. Raeford Razor.  Results reviewed and written information provided. Total time in face-to-face counseling 10 minutes.

## 2013-08-23 NOTE — Patient Instructions (Signed)
Per Dr. Raeford Razor.

## 2013-08-23 NOTE — Progress Notes (Signed)
Subjective:    Patient ID: Leonard [name], male    DOB: 1950/10/14, 63 y.o.   MRN: [id number]  HPI  Leonard [name] is here for HTN.   HTN Disease Monitoring:none Home BP Monitoring none Chest pain- no    Dyspnea- no Medications:clonidine, carvedilol, lasix, lisinopril  Compliance-  yes. Lightheadedness-  no  Edema- no   He was evaluated yesterday by an in home aide. They will be able to provide medication compliance and blood sugar testing.   CHRONIC DIABETES  Disease Monitoring  Blood Sugar Ranges: not checking   Polyuria: yes, going to the bathroom three times at night   Visual problems: yes, but scheduled for cataract surgery but has to wait until bp is under control    Medication Compliance: yes  Medication Side Effects  Hypoglycemia: no  .   Current Outpatient Prescriptions on File Prior to Visit  Medication Sig Dispense Refill  . amLODipine (NORVASC) 10 MG tablet Take 1 tablet (10 mg total) by mouth daily.  90 tablet  1  . aspirin 325 MG EC tablet Take 1 tablet (325 mg total) by mouth daily.  30 tablet  0  . Blood Glucose Monitoring Suppl (ACCU-CHEK AVIVA) device Use as instructed  1 each  0  . carvedilol (COREG) 25 MG tablet Take 1 tablet (25 mg total) by mouth 2 (two) times daily with a meal.  60 tablet  3  . cloNIDine (CATAPRES) 0.2 MG tablet Take 1 tablet (0.2 mg total) by mouth 2 (two) times daily.  90 tablet  1  . furosemide (LASIX) 40 MG tablet Take 1 tablet (40 mg total) by mouth daily.  90 tablet  1  . glipiZIDE (GLUCOTROL) 10 MG tablet Take 1 tablet (10 mg total) by mouth 2 (two) times daily before a meal.  90 tablet  1  . glucose blood (ACCU-CHEK AVIVA) test strip Use as instructed  100 each  12  . insulin glargine (LANTUS) 100 unit/mL SOPN Inject 0.3 mLs (30 Units total) into the skin 2 (two) times daily.  10 mL  3  . Lancet Devices (ACCU-CHEK SOFTCLIX) lancets Use as instructed to test 4 times a day.  Dx code 250.00.  May change to fit patients meter   100 each  12  . Lancets (ACCU-CHEK SOFT TOUCH) lancets Use as instructed  100 each  12  . lisinopril (PRINIVIL,ZESTRIL) 40 MG tablet Take 1 tablet (40 mg total) by mouth daily.  30 tablet  3  . metFORMIN (GLUCOPHAGE) 1000 MG tablet Take 1 tablet (1,000 mg total) by mouth 2 (two) times daily with a meal.  60 tablet  6  . oxyCODONE (OXY IR/ROXICODONE) 5 MG immediate release tablet Take 1-2 tablets (5-10 mg total) by mouth every 3 (three) hours as needed for moderate pain.  30 tablet  0  . pravastatin (PRAVACHOL) 40 MG tablet Take 1 tablet (40 mg total) by mouth daily.  90 tablet  1  . silver sulfADIAZINE (SILVADENE) 1 % cream Apply 1 application topically once a week. Gets applied at wound care      . spironolactone (ALDACTONE) 25 MG tablet Take 1 tablet (25 mg total) by mouth daily.  30 tablet  1   No current facility-administered medications on file prior to visit.   Review of Systems See HPI     Objective:   Physical Exam BP 206/92  Pulse 87  Ht 5\' 10"  (1.778 m)  Wt 221 lb (100.245 kg)  BMI  31.71 kg/m2 Gen: NAD, alert, cooperative with exam, well-appearing CV: RRR, good S1/S2, no murmur, no edema, capillary refill brisk  Resp: CTABL, no wheezes, non-labored Ext: well healed ulcer on first right MTP forefoot.       Assessment & Plan:

## 2013-08-24 ENCOUNTER — Encounter: Payer: Self-pay | Admitting: Pharmacist

## 2013-08-24 NOTE — Progress Notes (Signed)
Patient ID: Leonard [name], male   DOB: 25-Jun-1950, 63 y.o.   MRN: [id number] Reviewed: Agree with Dr. Graylin Shiver documentation and management.

## 2013-08-25 NOTE — Assessment & Plan Note (Signed)
Uncontrolled. In home aide evaluation performed the day before appt.  - increase lantus to 35 U BID  - f/u in one month  - Discussed with Dr. Valentina Lucks.

## 2013-08-25 NOTE — Assessment & Plan Note (Signed)
Uncontrolled. Asymptomatic.  - increase spironolactone 50 mg  - increase coreg 50 mg BID  - Start HCTZ 12.5 daiy  - decrease Lasix 20 mg daily  - consider changing clonidine to 0.2 from BID to TID  - Refer for sleep study  - Discussed with Dr. Valentina Lucks  - f/u with Dr. Valentina Lucks in two weeks.

## 2013-09-15 ENCOUNTER — Encounter: Payer: Self-pay | Admitting: Pharmacist

## 2013-09-15 ENCOUNTER — Ambulatory Visit (INDEPENDENT_AMBULATORY_CARE_PROVIDER_SITE_OTHER): Payer: Medicaid Other | Admitting: Pharmacist

## 2013-09-15 VITALS — BP 174/82 | HR 75 | Ht 69.0 in | Wt 225.1 lb

## 2013-09-15 DIAGNOSIS — E1149 Type 2 diabetes mellitus with other diabetic neurological complication: Secondary | ICD-10-CM

## 2013-09-15 DIAGNOSIS — I10 Essential (primary) hypertension: Secondary | ICD-10-CM

## 2013-09-15 DIAGNOSIS — E1140 Type 2 diabetes mellitus with diabetic neuropathy, unspecified: Secondary | ICD-10-CM

## 2013-09-15 DIAGNOSIS — E119 Type 2 diabetes mellitus without complications: Secondary | ICD-10-CM

## 2013-09-15 DIAGNOSIS — E1142 Type 2 diabetes mellitus with diabetic polyneuropathy: Secondary | ICD-10-CM

## 2013-09-15 DIAGNOSIS — I119 Hypertensive heart disease without heart failure: Secondary | ICD-10-CM

## 2013-09-15 DIAGNOSIS — E1165 Type 2 diabetes mellitus with hyperglycemia: Secondary | ICD-10-CM

## 2013-09-15 MED ORDER — GLIPIZIDE 10 MG PO TABS: 10.0000 mg | ORAL_TABLET | Freq: Two times a day (BID) | ORAL | Status: DC

## 2013-09-15 MED ORDER — ACCU-CHEK SOFT TOUCH LANCETS MISC: Status: DC

## 2013-09-15 MED ORDER — GLUCOSE BLOOD VI STRP: ORAL_STRIP | Status: DC

## 2013-09-15 MED ORDER — LISINOPRIL 40 MG PO TABS: 40.0000 mg | ORAL_TABLET | Freq: Every day | ORAL | Status: DC

## 2013-09-15 MED ORDER — CLONIDINE HCL 0.2 MG PO TABS: 0.2000 mg | ORAL_TABLET | Freq: Two times a day (BID) | ORAL | Status: DC

## 2013-09-15 MED ORDER — AMLODIPINE BESYLATE 10 MG PO TABS: 10.0000 mg | ORAL_TABLET | Freq: Every day | ORAL | Status: DC

## 2013-09-15 NOTE — Progress Notes (Signed)
Patient ID: Leonard [name], male   DOB: [birth year], 63 y.o.   MRN: [id number] Reviewed: Agree with Dr. Graylin Shiver documentation and management.

## 2013-09-15 NOTE — Patient Instructions (Addendum)
Blood pressure is improved.   We are sending a refill of amlodipine to your pharmacy.  Please pick this up today and start taking it again.   We are also sending a refill on test strips and lancets.  Please begin testing your blood sugars again and bring your meter to your next appointment.   Finish current supply of aspirin. When finished with current bottle, start taking 81 mg of aspirin each day.

## 2013-09-15 NOTE — Progress Notes (Signed)
S: Patient arrives unaccompanied today and in good spirits for HTN f/u. Diagnosed with Hypertension since approximately 63 years of age (~46 years). Medication compliance is self-reported to be good with little to no missed doses.   Patient reports to the clinic today reporting he has been out of amlodipine for the past few days, Uses pill box. Requesting refills.  Current BP Medications include:  Amlodipine, carvedilol, HCTZ, lisinopril, spironolactone, clonidine, furosemide  O:  Today's Office BP reading: 174/82 mmHg (manual reading)  BMET    Component Value Date/Time   NA 141 08/04/2013 1556   K 4.2 08/04/2013 1556   CL 106 08/04/2013 1556   CO2 24 08/04/2013 1556   GLUCOSE 218* 08/04/2013 1556   BUN 23 08/04/2013 1556   CREATININE 0.93 08/04/2013 1556   CREATININE 1.01 04/16/2013 1725   CALCIUM 9.4 08/04/2013 1556   GFRNONAA 78* 04/16/2013 1725   GFRAA >90 04/16/2013 1725    A/P: BP is improved but still not at goal of <140/90 today, possibly due to NOT taking amlodipine for the past few days since he ran out of pills.  Refills for amlodopine and other meds in short supply sent to pharmacy, provided pt with instructions to take all of his meds over the next few days.  Reassess BP next week during scheduled visit with Dr. Raeford Razor, consider increasing clonidine 0.2mg  BID to TID. Also of note, pt thought he was taking low-dose ASA but it was discovered he has actually been taking ASA 325mg  daily.  Advised pt to switch to ASA 81mg  daily instead.  Diabetes, longstanding in patient who is NOT currently monitoring Blood Glucose at home.  Refilled blood glucose strip prescription and encouraged patient to check BG over the next few days and bring in meter so any necessary adjustments in DM meds can be made during his upcoming visit.    Results reviewed, verbal instructions and encouragement provided to pt, refills sent to pharmacy.   F/U Clinic Visit with Dr. Raeford Razor next week on 09/20/2013.  Total time  in face-to-face counseling 20 minutes.  Patient seen with Kurtis Bushman, PharmD Candidate;  Soyla Dryer,  PharmD Resident

## 2013-09-15 NOTE — Assessment & Plan Note (Signed)
BP is improved but still not at goal of <140/90 today, possibly due to NOT taking amlodipine for the past few days since he ran out of pills.  Refills for amlodopine and other meds in short supply sent to pharmacy, provided pt with instructions to take all of his meds over the next few days.  Reassess BP next week during scheduled visit with Dr. Raeford Razor, consider increasing clonidine 0.2mg  BID to TID. Also of note, pt thought he was taking low-dose ASA but it was discovered he has actually been taking ASA 325mg  daily.  Advised pt to switch to ASA 81mg  daily instead.

## 2013-09-15 NOTE — Assessment & Plan Note (Signed)
Diabetes, longstanding in patient who is NOT currently monitoring Blood Glucose at home.  Refilled blood glucose strip prescription and encouraged patient to check BG over the next few days and bring in meter so any necessary adjustments in DM meds can be made during his upcoming visit.

## 2013-09-20 ENCOUNTER — Ambulatory Visit (INDEPENDENT_AMBULATORY_CARE_PROVIDER_SITE_OTHER): Payer: Medicaid Other | Admitting: Family Medicine

## 2013-09-20 ENCOUNTER — Encounter: Payer: Self-pay | Admitting: Family Medicine

## 2013-09-20 VITALS — BP 212/96 | HR 83 | Temp 99.2°F | Ht 69.0 in | Wt 221.1 lb

## 2013-09-20 DIAGNOSIS — E1310 Other specified diabetes mellitus with ketoacidosis without coma: Secondary | ICD-10-CM

## 2013-09-20 DIAGNOSIS — I10 Essential (primary) hypertension: Secondary | ICD-10-CM

## 2013-09-20 DIAGNOSIS — E1121 Type 2 diabetes mellitus with diabetic nephropathy: Secondary | ICD-10-CM

## 2013-09-20 DIAGNOSIS — N058 Unspecified nephritic syndrome with other morphologic changes: Secondary | ICD-10-CM

## 2013-09-20 DIAGNOSIS — E1110 Type 2 diabetes mellitus with ketoacidosis without coma: Secondary | ICD-10-CM

## 2013-09-20 DIAGNOSIS — E1129 Type 2 diabetes mellitus with other diabetic kidney complication: Secondary | ICD-10-CM

## 2013-09-20 MED ORDER — INSULIN GLARGINE 100 UNITS/ML SOLOSTAR PEN: 40.0000 [IU] | PEN_INJECTOR | Freq: Two times a day (BID) | SUBCUTANEOUS | Status: DC

## 2013-09-20 MED ORDER — ACCU-CHEK FASTCLIX LANCETS MISC: 40.0000 [IU] | Freq: Two times a day (BID) | Status: DC

## 2013-09-20 NOTE — Progress Notes (Signed)
Subjective:    Patient ID: Leonard [name], male    DOB: [birth year], 63 y.o.   MRN: [id number]  HPI   Leonard [name] is here for f/u for BP and DM2.  HTN Disease Monitoring:elevated  Home BP Monitoring no Chest pain- no    Dyspnea- no Medications:Amlodipine, carvedilol, HCTZ, lisinopril, spironolactone, clonidine, furosemide  Compliance-  yes. Lightheadedness-  no  Edema- no  Sleep study the 29th of September.     CHRONIC DIABETES  Disease Monitoring  Blood Sugar Ranges: 210-230's   Polyuria: no   Visual problems: cataracts surgery is still pending due to his blood pressure.    Medication Compliance: yes  Medication Side Effects  Hypoglycemia: no    Current Outpatient Prescriptions on File Prior to Visit  Medication Sig Dispense Refill  . amLODipine (NORVASC) 10 MG tablet Take 1 tablet (10 mg total) by mouth daily.  90 tablet  3  . aspirin 81 MG tablet Take 1 tablet (81 mg total) by mouth daily.      . Blood Glucose Monitoring Suppl (ACCU-CHEK AVIVA) device Use as instructed  1 each  0  . carvedilol (COREG) 25 MG tablet Take 2 tablets (50 mg total) by mouth 2 (two) times daily with a meal.  90 tablet  3  . cloNIDine (CATAPRES) 0.2 MG tablet Take 1 tablet (0.2 mg total) by mouth 2 (two) times daily.  90 tablet  1  . furosemide (LASIX) 20 MG tablet Take 40 mg by mouth daily.      Marland Kitchen glipiZIDE (GLUCOTROL) 10 MG tablet Take 1 tablet (10 mg total) by mouth 2 (two) times daily before a meal.  90 tablet  0  . glucose blood (ACCU-CHEK AVIVA) test strip Use as instructed. QS 3 times a day testing.  100 each  12  . hydrochlorothiazide (HYDRODIURIL) 25 MG tablet Take 0.5 tablets (12.5 mg total) by mouth daily.  90 tablet  3  . insulin glargine (LANTUS) 100 unit/mL SOPN Inject 0.35 mLs (35 Units total) into the skin 2 (two) times daily.  10 mL  3  . Lancet Devices (ACCU-CHEK SOFTCLIX) lancets Use as instructed to test 4 times a day.  Dx code 250.00.  May change to fit patients  meter  100 each  12  . Lancets (ACCU-CHEK SOFT TOUCH) lancets Use as instructed. QS 3 times a day testing. Patient may need new device as well.  100 each  12  . lisinopril (PRINIVIL,ZESTRIL) 40 MG tablet Take 1 tablet (40 mg total) by mouth daily.  90 tablet  3  . metFORMIN (GLUCOPHAGE) 1000 MG tablet Take 1 tablet (1,000 mg total) by mouth 2 (two) times daily with a meal.  60 tablet  6  . pravastatin (PRAVACHOL) 40 MG tablet Take 1 tablet (40 mg total) by mouth daily.  90 tablet  1  . spironolactone (ALDACTONE) 50 MG tablet Take 1 tablet (50 mg total) by mouth daily.  90 tablet  3   No current facility-administered medications on file prior to visit.    Review of Systems See HPI     Objective:   Physical Exam BP 212/96  Pulse 83  Temp(Src) 99.2 F (37.3 C) (Oral)  Ht 5\' 9"  (1.753 m)  Wt 221 lb 1.6 oz (100.29 kg)  BMI 32.64 kg/m2 Gen: NAD, alert, cooperative with exam, well-appearing CV: RRR, good S1/S2, no murmur, no edema, capillary refill brisk  Resp: CTABL, no wheezes, non-labored   Re-check of BP: 186/96  Assessment & Plan:

## 2013-09-20 NOTE — Assessment & Plan Note (Signed)
Uncontrolled. Fasting morning blood sugars the past 3 days to 223, 231, and 238. Reports compliance with medications. No episodes of hypoglycemia. - Sent in the fast clicks lancets - Increase Lantus 40 twice a day - Was denied by [employer] for an in-home aide service.

## 2013-09-20 NOTE — Assessment & Plan Note (Addendum)
Unontrolled. Improvement of blood pressure upon manual recheck. Reports compliance and currently asymptomatic. - clonidine 0.3 BID started  - continue all other medications. . - Followup in one month.

## 2013-09-20 NOTE — Patient Instructions (Addendum)
Thank you for coming in,   We are increaseing the lantus to 40 units twice daily.   I sent in the fast clix for your blood sugar monitoring.   Please continue to monitor your blood sugar.    Please feel free to call with any questions or concerns at any time, at [phone]. --Dr. Raeford Razor

## 2013-09-21 MED ORDER — CLONIDINE HCL 0.3 MG PO TABS: 0.3000 mg | ORAL_TABLET | Freq: Two times a day (BID) | ORAL | Status: DC

## 2013-10-03 ENCOUNTER — Telehealth: Payer: Self-pay | Admitting: Family Medicine

## 2013-10-03 DIAGNOSIS — I10 Essential (primary) hypertension: Secondary | ICD-10-CM

## 2013-10-03 NOTE — Telephone Encounter (Signed)
[facility] called and is requesting for the patient to have a BP cuff. jw

## 2013-10-04 ENCOUNTER — Other Ambulatory Visit: Payer: Self-pay | Admitting: Family Medicine

## 2013-10-12 ENCOUNTER — Ambulatory Visit (INDEPENDENT_AMBULATORY_CARE_PROVIDER_SITE_OTHER): Payer: Medicaid Other | Admitting: Family Medicine

## 2013-10-12 ENCOUNTER — Encounter: Payer: Self-pay | Admitting: Family Medicine

## 2013-10-12 VITALS — BP 158/60 | HR 74 | Temp 98.6°F | Wt 228.0 lb

## 2013-10-12 DIAGNOSIS — R0789 Other chest pain: Secondary | ICD-10-CM

## 2013-10-12 DIAGNOSIS — I509 Heart failure, unspecified: Secondary | ICD-10-CM

## 2013-10-12 DIAGNOSIS — I5031 Acute diastolic (congestive) heart failure: Secondary | ICD-10-CM

## 2013-10-12 MED ORDER — ADULT BLOOD PRESSURE CUFF LG KIT: PACK | Status: DC

## 2013-10-12 NOTE — Patient Instructions (Signed)
Nice to see you. You are holding on to additional fluid. I would like you to increase your furosemide dose to 80 mg a day, this is 2 tablets each day. You should take an additional pill when you get home.  We will need to see you back on Friday morning. If they do not have an appointment you should be seen tomorrow afternoon. If you have worsening shortness of breath or develop chest pain please go to the ED.

## 2013-10-12 NOTE — Progress Notes (Signed)
Patient ID: Leonard [name], male   DOB: 1950-12-27, 63 y.o.   MRN: [id number]  Leonard Rumps, MD Phone: [phone]  Leonard [name] is a 63 y.o. male who presents today for same day appointment.  Chest pain: Pain has been present for the past week. It is actually a pain in his right anterior lower ribs. It is stinging in nature. Lasts a couple of minutes at a time. Not worse with exertion. Comes out of no where and resolves with no intervention. No radiation or diaphoresis with this. No orthopnea or PND. Notes he does have occasional shortness of breath with walking around. 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 He has no active pain or shortness of breath at this time.  LE swelling: notes this has been coming and going for the past month. It is bilateral. He has been taking lasix 40 mg daily. He denies calf pain and tenderness. He has no history of blood clot.  Patient is a former smoker.   ROS: Per HPI   Physical Exam Filed Vitals:   10/12/13 1148  BP: 158/60  Pulse: 74  Temp: 98.6 F (37 C)  O2 sat 92%  Gen: Well NAD HEENT: PERRL,  MMM, no JVD noted Lungs: CTABL Nl WOB, no rales Heart: RRR no MRG MSK: no chest wall tenderness, no calf tenderness or cords Abd: soft, NT, ND Exts: 2+ pitting edema bilateral LE, bilateral calves measuring 42 cm, warm and well perfused.    Assessment/Plan: Please see individual problem list.   Leonard Rumps, MD [place] PGY-3

## 2013-10-12 NOTE — Telephone Encounter (Signed)
Message sent to [facility], Darlina Guys, for blood pressure cuff. Busick, Kevin Fenton

## 2013-10-12 NOTE — Telephone Encounter (Signed)
Orders placed for BP cuff.   Rosemarie Ax, MD PGY-2, [facility] 10/12/2013, 2:05 PM

## 2013-10-12 NOTE — Assessment & Plan Note (Addendum)
Patient with apparent volume overload on exam and weight is up at least 8 lbs, possibly up 15 lbs depending on if his dry weight is 221 vs 213 lbs. His bilateral edema and equal calf sizes with no history of VTE makes VTE an unlikely cause of his swelling and shortness of breath at this time. These are most likely related to the patients HF. At this time we will increase his lasix to 80 mg daily. He is to take an additional 40 mg tablet at home today. He is to take 80 mg tomorrow and Friday. He will need close follow-up in clinic on Friday. Given return precautions.  Precepted with Dr Andria Frames

## 2013-10-12 NOTE — Assessment & Plan Note (Signed)
Patients pain is atypical. Unlikely to be cardiac in cause or related to a VTE given no leg swelling and lack of history of clot. Potentially could be related to volume overload from CHF. Will continue to monitor. Return precautions given. Patient to follow-up in 2 days.

## 2013-10-14 ENCOUNTER — Ambulatory Visit (INDEPENDENT_AMBULATORY_CARE_PROVIDER_SITE_OTHER): Payer: Medicaid Other | Admitting: Family Medicine

## 2013-10-14 ENCOUNTER — Encounter: Payer: Self-pay | Admitting: Family Medicine

## 2013-10-14 VITALS — BP 209/97 | HR 76 | Temp 98.1°F | Ht 69.0 in | Wt 229.0 lb

## 2013-10-14 DIAGNOSIS — R600 Localized edema: Secondary | ICD-10-CM | POA: Insufficient documentation

## 2013-10-14 DIAGNOSIS — R609 Edema, unspecified: Secondary | ICD-10-CM

## 2013-10-14 NOTE — Patient Instructions (Signed)
I want you to take 80 mg of Lasix in the morning and 40 mg of Lasix around dinnertime for the next 2 days. I want you to followup on Monday or Tuesday in the same-day clinic to check your swelling. Avoid high salt foods Where your compression stockings Keep your feet elevated   Low-Sodium Eating Plan Sodium raises blood pressure and causes water to be held in the body. Getting less sodium from food will help lower your blood pressure, reduce any swelling, and protect your heart, liver, and kidneys. We get sodium by adding salt (sodium chloride) to food. Most of our sodium comes from canned, boxed, and frozen foods. Restaurant foods, fast foods, and pizza are also very high in sodium. Even if you take medicine to lower your blood pressure or to reduce fluid in your body, getting less sodium from your food is important. WHAT IS MY PLAN? Most people should limit their sodium intake to 2,300 mg a day. WHAT DO I NEED TO KNOW ABOUT THIS EATING PLAN? For the low-sodium eating plan, you will follow these general guidelines:  Choose foods with a % Daily Value for sodium of less than 5% (as listed on the food label).   Use salt-free seasonings or herbs instead of table salt or sea salt.   Check with your health care provider or pharmacist before using salt substitutes.   Eat fresh foods.  Eat more vegetables and fruits.  Limit canned vegetables. If you do use them, rinse them well to decrease the sodium.   Limit cheese to 1 oz (28 g) per day.   Eat lower-sodium products, often labeled as "lower sodium" or "no salt added."  Avoid foods that contain monosodium glutamate (MSG). MSG is sometimes added to [place] food and some canned foods.  Check food labels (Nutrition Facts labels) on foods to learn how much sodium is in one serving.  Eat more home-cooked food and less restaurant, buffet, and fast food.  When eating at a restaurant, ask that your food be prepared with less salt or none,  if possible.  HOW DO I READ FOOD LABELS FOR SODIUM INFORMATION? The Nutrition Facts label lists the amount of sodium in one serving of the food. If you eat more than one serving, you must multiply the listed amount of sodium by the number of servings. Food labels may also identify foods as:  Sodium free--Less than 5 mg in a serving.  Very low sodium--35 mg or less in a serving.  Low sodium--140 mg or less in a serving.  Light in sodium--50% less sodium in a serving. For example, if a food that usually has 300 mg of sodium is changed to become light in sodium, it will have 150 mg of sodium.  Reduced sodium--25% less sodium in a serving. For example, if a food that usually has 400 mg of sodium is changed to reduced sodium, it will have 300 mg of sodium. WHAT FOODS CAN I EAT? Grains Low-sodium cereals, including oats, puffed wheat and rice, and shredded wheat cereals. Low-sodium crackers. Unsalted rice and pasta. Lower-sodium bread.  Vegetables Frozen or fresh vegetables. Low-sodium or reduced-sodium canned vegetables. Low-sodium or reduced-sodium tomato sauce and paste. Low-sodium or reduced-sodium tomato and vegetable juices.  Fruits Fresh, frozen, and canned fruit. Fruit juice.  Meat and Other Protein Products Low-sodium canned tuna and salmon. Fresh or frozen meat, poultry, seafood, and fish. Lamb. Unsalted nuts. Dried beans, peas, and lentils without added salt. Unsalted canned beans. Homemade soups without salt.  Eggs.  Dairy Milk. Soy milk. Ricotta cheese. Low-sodium or reduced-sodium cheeses. Yogurt.  Condiments Fresh and dried herbs and spices. Salt-free seasonings. Onion and garlic powders. Low-sodium varieties of mustard and ketchup. Lemon juice.  Fats and Oils Reduced-sodium salad dressings. Unsalted butter.  Other Unsalted popcorn and pretzels.  The items listed above may not be a complete list of recommended foods or beverages. Contact your dietitian for more  options. WHAT FOODS ARE NOT RECOMMENDED? Grains Instant hot cereals. Bread stuffing, pancake, and biscuit mixes. Croutons. Seasoned rice or pasta mixes. Noodle soup cups. Boxed or frozen macaroni and cheese. Self-rising flour. Regular salted crackers. Vegetables Regular canned vegetables. Regular canned tomato sauce and paste. Regular tomato and vegetable juices. Frozen vegetables in sauces. Salted french fries. Olives. Angie Fava. Relishes. Sauerkraut. Salsa. Meat and Other Protein Products Salted, canned, smoked, spiced, or pickled meats, seafood, or fish. Bacon, ham, sausage, hot dogs, corned beef, chipped beef, and packaged luncheon meats. Salt pork. Jerky. Pickled herring. Anchovies, regular canned tuna, and sardines. Salted nuts. Dairy Processed cheese and cheese spreads. Cheese curds. Blue cheese and cottage cheese. Buttermilk.  Condiments Onion and garlic salt, seasoned salt, table salt, and sea salt. Canned and packaged gravies. Worcestershire sauce. Tartar sauce. Barbecue sauce. Teriyaki sauce. Soy sauce, including reduced sodium. Steak sauce. Fish sauce. Oyster sauce. Cocktail sauce. Horseradish. Regular ketchup and mustard. Meat flavorings and tenderizers. Bouillon cubes. Hot sauce. Tabasco sauce. Marinades. Taco seasonings. Relishes. Fats and Oils Regular salad dressings. Salted butter. Margarine. Ghee. Bacon fat.  Other Potato and tortilla chips. Corn chips and puffs. Salted popcorn and pretzels. Canned or dried soups. Pizza. Frozen entrees and pot pies.  The items listed above may not be a complete list of foods and beverages to avoid. Contact your dietitian for more information. Document Released: 07/12/2001 Document Revised: 01/25/2013 Document Reviewed: 11/24/2012 [facility] Patient Information 2015 [place], [place]. This information is not intended to replace advice given to you by your health care provider. Make sure you discuss any questions you have with your health care  provider.

## 2013-10-14 NOTE — Progress Notes (Signed)
   Subjective:    Patient ID: Leonard [name], male    DOB: [birth year], 63 y.o.   MRN: [id number]  HPI Leonard [name] is a 63 y.o. male presents to same-day clinic  Lower extremity edema: Patient was seen in clinic 2 days ago with concerns of bilateral lower extremity edema. At that time he was instructed to increase his Lasix dose to 80 mg daily. Patient states he has increased his Lasix dose, but hasn't noticed much difference in his edema. He does not feel he has increased urine output. He does admit to eating high salt diet over this time, including [employer] and [employer]. Patient does not wear compression stockings routinely, however he does own a pair. Patient denies headache, dizziness, shortness of breath, chest pain or palpitations.  Review of Systems Per history of present illness    Objective:   Physical Exam BP 209/97  Pulse 76  Temp(Src) 98.1 F (36.7 C) (Oral)  Ht 5\' 9"  (1.753 m)  Wt 229 lb (103.874 kg)  BMI 33.80 kg/m2 Gen: Pleasant, hard of hearing, African American male in no acute distress, nontoxic in appearance, well-developed, well-nourished, obese CV: RRR  Chest: CTAB, no wheeze or crackles Ext: No erythema. No tenderness, +1 edema of the right lower extremity, +2 edema of left lower extremity.      Assessment & Plan:

## 2013-10-14 NOTE — Assessment & Plan Note (Signed)
Patient advised and instructed with the AVS, to eat a low salt diet. In addition he is to wear his compression stockings and keep his feet elevated when at all possible. Increase Lasix to 80 mg in the morning with an additional 40 mg in the afternoon today, Saturday and Sunday.  Patient is to followup in clinic on Monday or Tuesday. Red flags discussed with patient on when to seek urgent care over the weekend if necessary.

## 2013-10-18 ENCOUNTER — Ambulatory Visit (INDEPENDENT_AMBULATORY_CARE_PROVIDER_SITE_OTHER): Payer: Medicaid Other | Admitting: Family Medicine

## 2013-10-18 ENCOUNTER — Encounter: Payer: Self-pay | Admitting: Family Medicine

## 2013-10-18 VITALS — BP 171/77 | HR 79 | Temp 98.8°F | Ht 70.0 in | Wt 226.0 lb

## 2013-10-18 DIAGNOSIS — Z23 Encounter for immunization: Secondary | ICD-10-CM

## 2013-10-18 DIAGNOSIS — R609 Edema, unspecified: Secondary | ICD-10-CM

## 2013-10-18 DIAGNOSIS — R600 Localized edema: Secondary | ICD-10-CM

## 2013-10-18 DIAGNOSIS — I10 Essential (primary) hypertension: Secondary | ICD-10-CM

## 2013-10-18 DIAGNOSIS — Z0000 Encounter for general adult medical examination without abnormal findings: Secondary | ICD-10-CM | POA: Diagnosis not present

## 2013-10-18 LAB — BASIC METABOLIC PANEL
BUN: 36 mg/dL — ABNORMAL HIGH (ref 6–23)
CHLORIDE: 101 meq/L (ref 96–112)
CO2: 27 mEq/L (ref 19–32)
Calcium: 8.7 mg/dL (ref 8.4–10.5)
Creat: 1.23 mg/dL (ref 0.50–1.35)
Glucose, Bld: 221 mg/dL — ABNORMAL HIGH (ref 70–99)
Potassium: 3.9 mEq/L (ref 3.5–5.3)
Sodium: 137 mEq/L (ref 135–145)

## 2013-10-18 MED ORDER — HYDROCHLOROTHIAZIDE 25 MG PO TABS: 12.5000 mg | ORAL_TABLET | Freq: Every day | ORAL | Status: DC

## 2013-10-18 MED ORDER — FUROSEMIDE 40 MG PO TABS: 40.0000 mg | ORAL_TABLET | Freq: Every day | ORAL | Status: DC

## 2013-10-18 NOTE — Progress Notes (Signed)
   Subjective:    Patient ID: Leonard [name], male    DOB: 1950-03-26, 63 y.o.   MRN: [id number]  HPI Leonard [name] is a 63 y.o. male presents for followup appointment  Leg edema: She has been seen her for the last week and a half her bilateral lower leg edema. She had an ejection fraction of 55-60% in March of 2015. We have increased his Lasix to 80 mg in the morning and 40 mg in the afternoon. His usual Lasix dose is 40 mg daily. He states he had watched his diet over the last 5 days since last being seen and has avoided added salt and fast foods. He has been wearing his compression stockings and attempting to keep his legs elevated. He has noticed that his legs are becoming less swollen and he has been urinating more. He does not take a potassium supplementation he knows and there is not one on his medication list. He denies chest pain, shortness of breath, palpitations or orthopnea.  Review of Systems Per history of present illness    Objective:   Physical Exam BP 171/77  Pulse 79  Temp(Src) 98.8 F (37.1 C) (Oral)  Ht 5\' 10"  (1.778 m)  Wt 226 lb (102.513 kg)  BMI 32.43 kg/m2 Gen: Pleasant, African American male, obese, no acute distress, nontoxic in appearance. HEENT: AT. [place]. Bilateral eyes without injections or icterus. MMM. CV: RRR no murmur appreciated Chest: CTAB, no wheeze or crackles Ext: No erythema. Nontender, right lower extremity trace edema, left +1 to +2 edema. Overall leg edema improved.    Assessment & Plan:

## 2013-10-18 NOTE — Assessment & Plan Note (Signed)
Flu shot given today

## 2013-10-18 NOTE — Assessment & Plan Note (Signed)
Patient down 3 pounds today, and lower leg edema is decreased but still present. Patient still above his "dry weight ". Ejection fraction in March was 55-60%. No shortness of breath and normal lung exam today. BMP collected today, may need potassium supplementation, does not appear that he is taking supplement currently Patient instructed to take 80 mg of furosemide for the next 3 days.  Continue low salt diet, which was given to him again and AVS today. Avoidance of fast foods, canned vegetables etc. Continue compression stockings in foot elevation with when at all possible. Followup with PCP in 2 weeks, patient's blood pressure has remained elevated over the last few visits, likely do to water retention but will need to be monitored and evaluated for possible increase in therapy.

## 2013-10-18 NOTE — Patient Instructions (Addendum)
Low-Sodium Eating Plan Sodium raises blood pressure and causes water to be held in the body. Getting less sodium from food will help lower your blood pressure, reduce any swelling, and protect your heart, liver, and kidneys. We get sodium by adding salt (sodium chloride) to food. Most of our sodium comes from canned, boxed, and frozen foods. Restaurant foods, fast foods, and pizza are also very high in sodium. Even if you take medicine to lower your blood pressure or to reduce fluid in your body, getting less sodium from your food is important. WHAT IS MY PLAN? Most people should limit their sodium intake to 2,300 mg a day. Your health care provider recommends that you limit your sodium intake to ___1500_______ a day.  WHAT DO I NEED TO KNOW ABOUT THIS EATING PLAN? For the low-sodium eating plan, you will follow these general guidelines:  Choose foods with a % Daily Value for sodium of less than 5% (as listed on the food label).   Use salt-free seasonings or herbs instead of table salt or sea salt.   Check with your health care provider or pharmacist before using salt substitutes.   Eat fresh foods.  Eat more vegetables and fruits.  Limit canned vegetables. If you do use them, rinse them well to decrease the sodium.   Limit cheese to 1 oz (28 g) per day.   Eat lower-sodium products, often labeled as "lower sodium" or "no salt added."  Avoid foods that contain monosodium glutamate (MSG). MSG is sometimes added to [place] food and some canned foods.  Check food labels (Nutrition Facts labels) on foods to learn how much sodium is in one serving.  Eat more home-cooked food and less restaurant, buffet, and fast food.  When eating at a restaurant, ask that your food be prepared with less salt or none, if possible.  HOW DO I READ FOOD LABELS FOR SODIUM INFORMATION? The Nutrition Facts label lists the amount of sodium in one serving of the food. If you eat more than one serving, you  must multiply the listed amount of sodium by the number of servings. Food labels may also identify foods as:  Sodium free--Less than 5 mg in a serving.  Very low sodium--35 mg or less in a serving.  Low sodium--140 mg or less in a serving.  Light in sodium--50% less sodium in a serving. For example, if a food that usually has 300 mg of sodium is changed to become light in sodium, it will have 150 mg of sodium.  Reduced sodium--25% less sodium in a serving. For example, if a food that usually has 400 mg of sodium is changed to reduced sodium, it will have 300 mg of sodium. WHAT FOODS CAN I EAT? Grains Low-sodium cereals, including oats, puffed wheat and rice, and shredded wheat cereals. Low-sodium crackers. Unsalted rice and pasta. Lower-sodium bread.  Vegetables Frozen or fresh vegetables. Low-sodium or reduced-sodium canned vegetables. Low-sodium or reduced-sodium tomato sauce and paste. Low-sodium or reduced-sodium tomato and vegetable juices.  Fruits Fresh, frozen, and canned fruit. Fruit juice.  Meat and Other Protein Products Low-sodium canned tuna and salmon. Fresh or frozen meat, poultry, seafood, and fish. Lamb. Unsalted nuts. Dried beans, peas, and lentils without added salt. Unsalted canned beans. Homemade soups without salt. Eggs.  Dairy Milk. Soy milk. Ricotta cheese. Low-sodium or reduced-sodium cheeses. Yogurt.  Condiments Fresh and dried herbs and spices. Salt-free seasonings. Onion and garlic powders. Low-sodium varieties of mustard and ketchup. Lemon juice.  Fats and Oils  Reduced-sodium salad dressings. Unsalted butter.  Other Unsalted popcorn and pretzels.  The items listed above may not be a complete list of recommended foods or beverages. Contact your dietitian for more options. WHAT FOODS ARE NOT RECOMMENDED? Grains Instant hot cereals. Bread stuffing, pancake, and biscuit mixes. Croutons. Seasoned rice or pasta mixes. Noodle soup cups. Boxed or frozen  macaroni and cheese. Self-rising flour. Regular salted crackers. Vegetables Regular canned vegetables. Regular canned tomato sauce and paste. Regular tomato and vegetable juices. Frozen vegetables in sauces. Salted french fries. Olives. Angie Fava. Relishes. Sauerkraut. Salsa. Meat and Other Protein Products Salted, canned, smoked, spiced, or pickled meats, seafood, or fish. Bacon, ham, sausage, hot dogs, corned beef, chipped beef, and packaged luncheon meats. Salt pork. Jerky. Pickled herring. Anchovies, regular canned tuna, and sardines. Salted nuts. Dairy Processed cheese and cheese spreads. Cheese curds. Blue cheese and cottage cheese. Buttermilk.  Condiments Onion and garlic salt, seasoned salt, table salt, and sea salt. Canned and packaged gravies. Worcestershire sauce. Tartar sauce. Barbecue sauce. Teriyaki sauce. Soy sauce, including reduced sodium. Steak sauce. Fish sauce. Oyster sauce. Cocktail sauce. Horseradish. Regular ketchup and mustard. Meat flavorings and tenderizers. Bouillon cubes. Hot sauce. Tabasco sauce. Marinades. Taco seasonings. Relishes. Fats and Oils Regular salad dressings. Salted butter. Margarine. Ghee. Bacon fat.  Other Potato and tortilla chips. Corn chips and puffs. Salted popcorn and pretzels. Canned or dried soups. Pizza. Frozen entrees and pot pies.  The items listed above may not be a complete list of foods and beverages to avoid. Contact your dietitian for more information. Document Released: 07/12/2001 Document Revised: 01/25/2013 Document Reviewed: 11/24/2012 [facility] Patient Information 2015 [place], [place]. This information is not intended to replace advice given to you by your health care provider. Make sure you discuss any questions you have with your health care provider.  Continue to take furosemide 40 mg x2 tabs for the next 3 days. I have refilled your furosemide, and a new label read 40-80 mg once a day. I want you to use your judgment into her  legs are swollen more than usual, or your weight is above 5 pounds from your normal weight, then I want you to take the 80 mg dose that day and call your Dr. I want you to followup with your primary care doctor in approximately 2 weeks Continue to wear compression stockings and follow a low-salt diet We will check your kidney function and potassium today, I will call you with those results Please keep records that you received her flu shot today.

## 2013-10-19 ENCOUNTER — Telehealth: Payer: Self-pay | Admitting: Family Medicine

## 2013-10-19 ENCOUNTER — Telehealth: Payer: Self-pay

## 2013-10-19 NOTE — Telephone Encounter (Signed)
Please call Leonard [name], his kidney function is good and his potassium is normal. If he continues to need higher doses of lasix he may need a potassium supplement in the future. He can discuss this with his PCP at his follow-up in 2 weeks. Thanks.

## 2013-10-19 NOTE — Telephone Encounter (Signed)
Called and informed the patient that kidney function is good and that he will need to follow up with PCP if there are problems in in two weeks.Leonard [name]

## 2013-11-01 ENCOUNTER — Ambulatory Visit (HOSPITAL_BASED_OUTPATIENT_CLINIC_OR_DEPARTMENT_OTHER): Payer: Medicaid Other | Attending: Internal Medicine

## 2013-11-01 VITALS — Ht 68.0 in | Wt 222.0 lb

## 2013-11-01 DIAGNOSIS — G4710 Hypersomnia, unspecified: Secondary | ICD-10-CM | POA: Diagnosis present

## 2013-11-01 DIAGNOSIS — I10 Essential (primary) hypertension: Secondary | ICD-10-CM

## 2013-11-01 DIAGNOSIS — G4730 Sleep apnea, unspecified: Secondary | ICD-10-CM | POA: Diagnosis present

## 2013-11-08 ENCOUNTER — Ambulatory Visit (INDEPENDENT_AMBULATORY_CARE_PROVIDER_SITE_OTHER): Payer: Medicaid Other | Admitting: Family Medicine

## 2013-11-08 ENCOUNTER — Encounter: Payer: Self-pay | Admitting: Family Medicine

## 2013-11-08 VITALS — BP 176/69 | HR 72 | Temp 98.3°F | Ht 68.0 in | Wt 223.2 lb

## 2013-11-08 DIAGNOSIS — L97509 Non-pressure chronic ulcer of other part of unspecified foot with unspecified severity: Secondary | ICD-10-CM

## 2013-11-08 DIAGNOSIS — E1140 Type 2 diabetes mellitus with diabetic neuropathy, unspecified: Secondary | ICD-10-CM

## 2013-11-08 DIAGNOSIS — R600 Localized edema: Secondary | ICD-10-CM

## 2013-11-08 DIAGNOSIS — E11621 Type 2 diabetes mellitus with foot ulcer: Secondary | ICD-10-CM

## 2013-11-08 DIAGNOSIS — I10 Essential (primary) hypertension: Secondary | ICD-10-CM

## 2013-11-08 LAB — POCT GLYCOSYLATED HEMOGLOBIN (HGB A1C): HEMOGLOBIN A1C: 8.8

## 2013-11-08 NOTE — Progress Notes (Signed)
Subjective:    Patient ID: Leonard [name], male    DOB: 08-Nov-1950, 63 y.o.   MRN: [id number]  HPI  Leonard [name] is here for dm2 and HTN f/u.   HTN Disease Monitoring: Home BP Monitoring none Chest pain- none    Dyspnea- none Medications: Amlodipine, carvedilol, HCTZ, lisinopril, spironolactone, clonidine (taking 0.2 BID instead of 0.3) , furosemide Compliance-  yes. Lightheadedness-  no Edema- no   Recently given lasix 40 mg daily in order to decrease his leg swelling. Doesn't report any leg swelling today. Recently had his sleep study performed and results are pending.   CHRONIC DIABETES  Disease Monitoring  Blood Sugar Ranges: none   Polyuria: no   Visual problems: no   Medication Compliance: yes  Medication Side Effects  Hypoglycemia: no   Current Outpatient Prescriptions on File Prior to Visit  Medication Sig Dispense Refill  . ACCU-CHEK FASTCLIX LANCETS MISC 40 Units by Does not apply route 2 (two) times daily.  1 each  5  . amLODipine (NORVASC) 10 MG tablet Take 1 tablet (10 mg total) by mouth daily.  90 tablet  3  . aspirin 81 MG tablet Take 1 tablet (81 mg total) by mouth daily.      . B-D ULTRAFINE III SHORT PEN 31G X 8 MM MISC USE AS DIRECTED  100 each  0  . Blood Glucose Monitoring Suppl (ACCU-CHEK AVIVA) device Use as instructed  1 each  0  . Blood Pressure Monitoring (ADULT BLOOD PRESSURE CUFF LG) KIT Please measure your blood pressure the same time everyday, at least one time a week.  1 each  0  . carvedilol (COREG) 25 MG tablet Take 2 tablets (50 mg total) by mouth 2 (two) times daily with a meal.  90 tablet  3  . cloNIDine (CATAPRES) 0.3 MG tablet Take 1 tablet (0.3 mg total) by mouth 2 (two) times daily.  60 tablet  1  . furosemide (LASIX) 40 MG tablet Take 1-2 tablets (40-80 mg total) by mouth daily.  60 tablet  0  . glipiZIDE (GLUCOTROL) 10 MG tablet Take 1 tablet (10 mg total) by mouth 2 (two) times daily before a meal.  90 tablet  0  . glucose  blood (ACCU-CHEK AVIVA) test strip Use as instructed. QS 3 times a day testing.  100 each  12  . hydrochlorothiazide (HYDRODIURIL) 25 MG tablet Take 0.5 tablets (12.5 mg total) by mouth daily.  90 tablet  0  . insulin glargine (LANTUS) 100 unit/mL SOPN Inject 0.4 mLs (40 Units total) into the skin 2 (two) times daily.  10 mL  3  . Lancet Devices (ACCU-CHEK SOFTCLIX) lancets Use as instructed to test 4 times a day.  Dx code 250.00.  May change to fit patients meter  100 each  12  . lisinopril (PRINIVIL,ZESTRIL) 40 MG tablet Take 1 tablet (40 mg total) by mouth daily.  90 tablet  3  . metFORMIN (GLUCOPHAGE) 1000 MG tablet Take 1 tablet (1,000 mg total) by mouth 2 (two) times daily with a meal.  60 tablet  6  . pravastatin (PRAVACHOL) 40 MG tablet Take 1 tablet (40 mg total) by mouth daily.  90 tablet  1  . spironolactone (ALDACTONE) 50 MG tablet Take 1 tablet (50 mg total) by mouth daily.  90 tablet  3   No current facility-administered medications on file prior to visit.    SHx: has stopped smoking   Health Maintenance: received flu vaccine  Review of Systems See HPI     Objective:   Physical Exam BP 176/69  Pulse 72  Temp(Src) 98.3 F (36.8 C) (Oral)  Ht 5' 8" (1.727 m)  Wt 223 lb 3.2 oz (101.243 kg)  BMI 33.95 kg/m2 Gen: NAD, alert, cooperative with exam, well-appearing CV: RRR, good S1/S2, no murmur, trace edema, capillary refill brisk  Resp: CTABL, no wheezes, non-labored  Neuro: no gross deficits.        Assessment & Plan:

## 2013-11-08 NOTE — Patient Instructions (Signed)
Thank you for coming in,   Please try to get a scale to weigh yourself. Weigh yourself everyday at the same time. If you are three pounds over 222 pounds then take one pill of the 40 mg furosemide.   Finish the bottle of clonidine of 0.2 mg twice daily and then start taking the clonidine 0.3 mg twice a day.   If any trouble, please give me a call.   Please follow up in three month.    Please feel free to call with any questions or concerns at any time, at [phone]. --Dr. Raeford Razor

## 2013-11-09 NOTE — Assessment & Plan Note (Signed)
Uncontrolled. A1c holding steady over the past 4 measurements. Patient with no reported blood sugar readings. He continues to be non-compliant in this area.  - continue current medications.

## 2013-11-09 NOTE — Assessment & Plan Note (Signed)
Uncontrolled but improving since previous appts. Taking clonidine 0.2 instead of the 0.3 as prescribed. Asymptomatic  - will finish his prescription of 0.2 mg BID and then start 0.3 mg BID  - Continue all other medications.

## 2013-11-09 NOTE — Assessment & Plan Note (Signed)
His weight appears to be at his baseline. No PND, orthopnea or SOB. Trace edema b/l.  - will stop lasix today  - encourage to get a scale. Instructed to take 40 mg for every three pounds greater than 222 lbs.  - then call office if greater than 5 pounds over weight.

## 2013-11-13 DIAGNOSIS — G4733 Obstructive sleep apnea (adult) (pediatric): Secondary | ICD-10-CM

## 2013-11-13 NOTE — Sleep Study (Signed)
   NAME: Leonard [name] DATE OF BIRTH:  04-Jan-1951 MEDICAL RECORD NUMBER [id number]  LOCATION: [facility]  PHYSICIAN: Laelynn Blizzard D  DATE OF STUDY: 11/01/2013  SLEEP STUDY TYPE: Nocturnal Polysomnogram               REFERRING PHYSICIAN: Rosemarie Ax, MD  INDICATION FOR STUDY: Hypersomnia with sleep apnea  EPWORTH SLEEPINESS SCORE:   6/24 HEIGHT: 5\' 8"  (172.7 cm)  WEIGHT: 222 lb (100.699 kg)    Body mass index is 33.76 kg/(m^2).  NECK SIZE: 18 in.  MEDICATIONS: Charted for review  SLEEP ARCHITECTURE: Total sleep time 249 minutes with sleep efficiency 65.4%. Stage I was 12.2%, stage II 76.1%, stage III absent, REM 11.6% of total sleep time. Sleep latency 6.5 minutes, REM latency 75.5 minutes, awake after sleep onset 11.5 minutes, arousal index 11.3, bedtime medication: None  RESPIRATORY DATA: Apnea hypopneas index (AHI) 4.6 per hour. 19 total events scored including 6 obstructive apneas and 13 central apneas. Events were more common while supine. REM AHI 6.2 per hour. He did not qualify for a split CPAP titration.  OXYGEN DATA: Moderate snoring with oxygen desaturation to a nadir of 72% and mean saturation 89.4% on room air. 69.4 minutes were recorded with oxygen saturation less than 88% on room air.  CARDIAC DATA: Sinus rhythm with PACs and PVCs  MOVEMENT/PARASOMNIA: No significant movement disturbance, bathroom x2  IMPRESSION/ RECOMMENDATION:   1) Sleep pattern was significant for spontaneous awakening at 2:30 AM with no further sleep. If this pattern is typical of home environment, then management for insomnia may be helpful.  2) Occasional respiratory event with sleep disturbance, within normal limits. AHI 4.6 per hour (the normal range for adults is an AHI from 0-5 events per hour). 3) Moderate snoring with oxygen desaturation to a nadir of 72% and mean saturation only 89.4% on room air with total recording time of 69.4 minutes during which oxygen  saturation was less than 88% on room air. Room air saturation on arrival while awake and upright was 98%. Consider overnight oximetry on room air to assess need for home oxygen during sleep.  Deneise Lever Diplomate, American Board of Sleep Medicine  ELECTRONICALLY SIGNED ON:  11/13/2013, 1:41 PM [place] PH: [phone]   FX: [phone] [place]

## 2013-11-24 ENCOUNTER — Other Ambulatory Visit: Payer: Self-pay | Admitting: Family Medicine

## 2013-12-08 ENCOUNTER — Telehealth: Payer: Self-pay | Admitting: Family Medicine

## 2013-12-08 ENCOUNTER — Inpatient Hospital Stay (HOSPITAL_COMMUNITY)
Admission: EM | Admit: 2013-12-08 | Discharge: 2013-12-12 | DRG: 190 | Disposition: A | Discharge status: Home Health | Payer: Medicaid Other | Attending: Family Medicine | Admitting: Family Medicine

## 2013-12-08 ENCOUNTER — Encounter (HOSPITAL_COMMUNITY): Payer: Self-pay | Admitting: *Deleted

## 2013-12-08 ENCOUNTER — Emergency Department (HOSPITAL_COMMUNITY): Payer: Medicaid Other

## 2013-12-08 DIAGNOSIS — I10 Essential (primary) hypertension: Secondary | ICD-10-CM | POA: Diagnosis present

## 2013-12-08 DIAGNOSIS — I739 Peripheral vascular disease, unspecified: Secondary | ICD-10-CM | POA: Diagnosis present

## 2013-12-08 DIAGNOSIS — Z89421 Acquired absence of other right toe(s): Secondary | ICD-10-CM

## 2013-12-08 DIAGNOSIS — J441 Chronic obstructive pulmonary disease with (acute) exacerbation: Secondary | ICD-10-CM | POA: Diagnosis present

## 2013-12-08 DIAGNOSIS — I2510 Atherosclerotic heart disease of native coronary artery without angina pectoris: Secondary | ICD-10-CM | POA: Diagnosis present

## 2013-12-08 DIAGNOSIS — R509 Fever, unspecified: Secondary | ICD-10-CM

## 2013-12-08 DIAGNOSIS — I252 Old myocardial infarction: Secondary | ICD-10-CM | POA: Diagnosis not present

## 2013-12-08 DIAGNOSIS — Z79899 Other long term (current) drug therapy: Secondary | ICD-10-CM

## 2013-12-08 DIAGNOSIS — Z794 Long term (current) use of insulin: Secondary | ICD-10-CM

## 2013-12-08 DIAGNOSIS — I5031 Acute diastolic (congestive) heart failure: Secondary | ICD-10-CM

## 2013-12-08 DIAGNOSIS — Z8249 Family history of ischemic heart disease and other diseases of the circulatory system: Secondary | ICD-10-CM | POA: Diagnosis not present

## 2013-12-08 DIAGNOSIS — Z7982 Long term (current) use of aspirin: Secondary | ICD-10-CM

## 2013-12-08 DIAGNOSIS — G4730 Sleep apnea, unspecified: Secondary | ICD-10-CM | POA: Diagnosis present

## 2013-12-08 DIAGNOSIS — E876 Hypokalemia: Secondary | ICD-10-CM | POA: Diagnosis present

## 2013-12-08 DIAGNOSIS — D696 Thrombocytopenia, unspecified: Secondary | ICD-10-CM | POA: Diagnosis present

## 2013-12-08 DIAGNOSIS — E782 Mixed hyperlipidemia: Secondary | ICD-10-CM

## 2013-12-08 DIAGNOSIS — Z951 Presence of aortocoronary bypass graft: Secondary | ICD-10-CM | POA: Diagnosis not present

## 2013-12-08 DIAGNOSIS — G4710 Hypersomnia, unspecified: Secondary | ICD-10-CM | POA: Diagnosis present

## 2013-12-08 DIAGNOSIS — Z87891 Personal history of nicotine dependence: Secondary | ICD-10-CM

## 2013-12-08 DIAGNOSIS — R05 Cough: Secondary | ICD-10-CM | POA: Diagnosis present

## 2013-12-08 DIAGNOSIS — J9620 Acute and chronic respiratory failure, unspecified whether with hypoxia or hypercapnia: Secondary | ICD-10-CM | POA: Diagnosis present

## 2013-12-08 DIAGNOSIS — I509 Heart failure, unspecified: Secondary | ICD-10-CM

## 2013-12-08 DIAGNOSIS — R0902 Hypoxemia: Secondary | ICD-10-CM | POA: Diagnosis present

## 2013-12-08 DIAGNOSIS — D649 Anemia, unspecified: Secondary | ICD-10-CM | POA: Diagnosis present

## 2013-12-08 DIAGNOSIS — I5032 Chronic diastolic (congestive) heart failure: Secondary | ICD-10-CM | POA: Diagnosis present

## 2013-12-08 DIAGNOSIS — D631 Anemia in chronic kidney disease: Secondary | ICD-10-CM | POA: Diagnosis present

## 2013-12-08 DIAGNOSIS — E1129 Type 2 diabetes mellitus with other diabetic kidney complication: Secondary | ICD-10-CM

## 2013-12-08 DIAGNOSIS — Z95820 Peripheral vascular angioplasty status with implants and grafts: Secondary | ICD-10-CM | POA: Diagnosis not present

## 2013-12-08 DIAGNOSIS — I5033 Acute on chronic diastolic (congestive) heart failure: Secondary | ICD-10-CM | POA: Diagnosis present

## 2013-12-08 DIAGNOSIS — E119 Type 2 diabetes mellitus without complications: Secondary | ICD-10-CM

## 2013-12-08 DIAGNOSIS — N189 Chronic kidney disease, unspecified: Secondary | ICD-10-CM

## 2013-12-08 LAB — URINALYSIS, ROUTINE W REFLEX MICROSCOPIC
Bilirubin Urine: NEGATIVE
Glucose, UA: NEGATIVE mg/dL
Ketones, ur: NEGATIVE mg/dL
Leukocytes, UA: NEGATIVE
Nitrite: NEGATIVE
Protein, ur: 100 mg/dL — AB
SPECIFIC GRAVITY, URINE: 1.015 (ref 1.005–1.030)
UROBILINOGEN UA: 0.2 mg/dL (ref 0.0–1.0)
pH: 5.5 (ref 5.0–8.0)

## 2013-12-08 LAB — CBC
HCT: 26.5 % — ABNORMAL LOW (ref 39.0–52.0)
Hemoglobin: 8.7 g/dL — ABNORMAL LOW (ref 13.0–17.0)
MCH: 26.9 pg (ref 26.0–34.0)
MCHC: 32.8 g/dL (ref 30.0–36.0)
MCV: 81.8 fL (ref 78.0–100.0)
PLATELETS: 125 10*3/uL — AB (ref 150–400)
RBC: 3.24 MIL/uL — AB (ref 4.22–5.81)
RDW: 15.3 % (ref 11.5–15.5)
WBC: 4.1 10*3/uL (ref 4.0–10.5)

## 2013-12-08 LAB — PRO B NATRIURETIC PEPTIDE: Pro B Natriuretic peptide (BNP): 2795 pg/mL — ABNORMAL HIGH (ref 0–125)

## 2013-12-08 LAB — I-STAT CHEM 8, ED
BUN: 21 mg/dL (ref 6–23)
CHLORIDE: 103 meq/L (ref 96–112)
Calcium, Ion: 1.14 mmol/L (ref 1.13–1.30)
Creatinine, Ser: 1.3 mg/dL (ref 0.50–1.35)
Glucose, Bld: 229 mg/dL — ABNORMAL HIGH (ref 70–99)
HCT: 28 % — ABNORMAL LOW (ref 39.0–52.0)
Hemoglobin: 9.5 g/dL — ABNORMAL LOW (ref 13.0–17.0)
Potassium: 3.3 mEq/L — ABNORMAL LOW (ref 3.7–5.3)
SODIUM: 140 meq/L (ref 137–147)
TCO2: 22 mmol/L (ref 0–100)

## 2013-12-08 LAB — I-STAT TROPONIN, ED: TROPONIN I, POC: 0.02 ng/mL (ref 0.00–0.08)

## 2013-12-08 LAB — URINE MICROSCOPIC-ADD ON

## 2013-12-08 MED ORDER — FUROSEMIDE 10 MG/ML IJ SOLN
80.0000 mg | Freq: Once | INTRAMUSCULAR | Status: AC
  Administered 2013-12-08: 80 mg via INTRAVENOUS
  Filled 2013-12-08: qty 8

## 2013-12-08 NOTE — H&P (Signed)
[facility] Admission History and Physical Service Pager: [phone]  [name] Medical record number: [id number] Date of birth: 1950-10-21 Age: 63 y.o. Gender: male  Primary Care Provider: Rosemarie Ax, MD Consultants: None Code Status: Full   Chief Complaint: Cough  Assessment and Plan: Leonard [name] is a 63 y.o. male presenting with worsening cough and lower extremity swelling bilaterally found to be hypoxic on presentation. PMH is significant for acute diastolic heart failure, CAD s/p 3 vessel CABG in 04/2013, PAD, HTN, HLD, and tobacco abuse (resolved).  #Shortness of breath: most likely 2/2 CHF exacerbation with possible COPD component.  Last echo in 04/2013 noted an EF 55-60% with grade 2 diastolic dysfunction. BNP grossly elevated to 2795, previously 374. CXR with patchy bilateral pulmonary infiltrates, L>R consistent with pulmonary edema and probably emphysema. Per outpatient notes, his dry weight is about 222lbs.  Patient has a >40 year of smoking history as well PFTs from 04/2013 that revealed a FEV/FVC ~47% which may be contributing to the clinical picture.  EKG w/ ST depression in inferior leads and V6 but patient denies any chest pain/diaphoresis. Less likely PE as the patient responded to supplemental O2 and has not been tachycardic.  - Admit to step down unit, attending Dr. Ree Kida - Patient currently on non-rebreather, may require BiPAP  - s/p Lasix 55m IV in the ED - Continue diuresis with Lasix 859mIV in the AM, then re-assess  - Duonebs Q6H for likely component of COPD exacerbation as well.  Consider addition of steroids/abx if does not markedly improve w/ Diuresis.  - Continue home coreg, spironolactone, lisinopril; holding HCTZ given aggressive diuresis - repeat EKG in the AM - trend troponins  - echocardiogram in the AM  - Daily weights and I&Os   #Hypertension, uncontrolled: Noted to be elevated in previous outpt  visits as well.  - holding clonidine 0.28m48mID due contraindication in HF.  Holding HCTZ while increasing diuretic therapy.  Could consider adding back if needed.  - continue coreg 24m57mD, amlodipine 10mg79mly, lisinopril 40mg 66my, spironolactone 24mg d106m  - continue to monitor   #Diabetes: Hemoglobin A1c 8.8 on 11/08/13. 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 - f/u CBGs - Lantus 40u BID and SSI - Holding Metformin 1000mg BI17md Glipizide 10mg BID6mAD s/p CABG and hyperlipidemia: Triglycerides in 11/2012 elevated to 400 - ASA 81mg  - C8mntly on pravastatin, will switch to Lipitor as patient certainly needs high potency statin given PMH/risk factors.   #Anemia: Appears chronic, baseline around 8.5-10. Iron panels in 02/2013 with elevated ferritin and decreased %sat, otherwise wnl.  Patient currently at his baseline - continue to monitor CBCs  #Hypokalemia: K 3.3 - Will replete with KDUR 40mEq, wil67mpect hypokalemia to worsen with diuresis - Continue to monitor with BMPs daily  #Hypersomnia with sleep apnea: Per sleep study on 10/11: Moderate snoring with oxygen desaturation to a nadir of 72% and mean saturation only 89.4% on room air with total recording time of 69.4 minutes during which oxygen saturation was less than 88% on room air. Room air saturation on arrival while awake and upright was 98%.  - Consider overnight oximetry on room air to assess need for home oxygen during sleep. - While pt has previously not been on home O2, he may require O2 during sleeping per previous reports.  FEN/GI: Heart healthy/carb modified diet,  NS IV lock Prophylaxis: Heparin  Disposition: Admit to stepdown, FMTS   History of Present Illness: Leonard [name] is a 63 y.o. male with a PMHx of acute diastolic heart failure, CAD s/p 3 vessel CABG in 04/2013, PAD, and HTN presenting with decreased activity and tiredness x 1  day. Additionally, he has had a progressively worsening cough that is intermittently productive of a clear sputum. He has LE edema chronically, but per his niece this has been worsening recently. The patient denies orthopnea or PND. Denies any chest pain, SOB, diaphoresis or URI symptoms.  He states he's been compliant with all of his medications, however his niece is unsure.  No change in bowel or bladder function.   In the ED, the patient was found to be hypoxic to 86-89% on 4L [place] and was placed on a non-rebreather.   Review Of Systems: Otherwise 12 point review of systems was performed and was unremarkable.  Patient Active Problem List   Diagnosis Date Noted  . Health care maintenance 10/18/2013  . Bilateral lower extremity edema 10/14/2013  . History of tobacco use 08/23/2013  . Skin callus 08/04/2013  . 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 CAD (coronary artery disease) 04/06/2013  . NSTEMI (non-ST elevated myocardial infarction) 04/05/2013  . PVD - hx of Rt SFA PTA and s/p Rt 4-5th toe amp Dec 2014 04/05/2013  . Acute diastolic CHF (congestive heart failure) 04/04/2013  . Atherosclerosis of native arteries of the extremities with gangrene 02/15/2013  . Diabetic osteomyelitis 01/29/2013  . Normocytic anemia 01/26/2013  . Diabetic ulcer of toe, R 1st plantar surface, viable 01/25/2013  . Gangrene of foot, right fifth metatarsal with osteomyelitis 01/13/2013  . Essential hypertension, benign 11/25/2012  . Diabetes mellitus, type 2 01/06/2012  . Mixed hyperlipidemia 07/17/2008   Past Medical History: Past Medical History  Diagnosis Date  . Hypertension   . PAD (peripheral artery disease)   . Gangrene of toe     right 5th/notes 01/04/2013   . High cholesterol   . Type II diabetes mellitus    Past Surgical History: Past Surgical History  Procedure Laterality Date  . Throat surgery  1983    polyps  . Angioplasty / stenting femoral Right 01/13/2013    superficial femoral artery x 2 (3  mm x 60 mm, 4 mm x 60 mm)  . Tonsillectomy and adenoidectomy  ~ 1965  . Amputation Right 01/28/2013    Procedure: RAY AMPUTATION RIGHT  4th & 5th TOE;  Surgeon: Rosetta Posner, MD;  Location: [facility] OR;  Service: Vascular;  Laterality: Right;  . Coronary artery bypass graft N/A 04/07/2013    Procedure: CORONARY ARTERY BYPASS GRAFTING (CABG);  Surgeon: Ivin Poot, MD;  Location: [place];  Service: Open Heart Surgery;  Laterality: N/A;  . Intraoperative transesophageal echocardiogram N/A 04/07/2013    Procedure: INTRAOPERATIVE TRANSESOPHAGEAL ECHOCARDIOGRAM;  Surgeon: Ivin Poot, MD;  Location: [place];  Service: Open Heart Surgery;  Laterality: N/A;   Social History: History  Substance Use Topics  . Smoking status: Former Smoker -- 1.00 packs/day for 46 years    Types: Cigarettes    Start date: 02/04/1967    Quit date: 02/03/2013  . Smokeless tobacco: Never Used     Comment: Doing well with quitting.  . Alcohol Use: No   Additional social history: >40 yr smoke history  Please also refer to relevant sections of EMR.  Family History: Family History  Problem Relation Age of Onset  .  Cancer Mother     lung cancer  . Hypertension Mother   . Cancer Sister     patient thinks it was uterine cancer  . Hypertension Sister    Allergies and Medications: No Known Allergies No current facility-administered medications on file prior to encounter.   Current Outpatient Prescriptions on File Prior to Encounter  Medication Sig Dispense Refill  . ACCU-CHEK FASTCLIX LANCETS MISC 40 Units by Does not apply route 2 (two) times daily. 1 each 5  . amLODipine (NORVASC) 10 MG tablet Take 1 tablet (10 mg total) by mouth daily. 90 tablet 3  . aspirin 81 MG tablet Take 1 tablet (81 mg total) by mouth daily.    . B-D ULTRAFINE III SHORT PEN 31G X 8 MM MISC USE AS DIRECTED 100 each 0  . Blood Glucose Monitoring Suppl (ACCU-CHEK AVIVA) device Use as instructed 1 each 0  . Blood Pressure Monitoring (ADULT BLOOD  PRESSURE CUFF LG) KIT Please measure your blood pressure the same time everyday, at least one time a week. 1 each 0  . cloNIDine (CATAPRES) 0.3 MG tablet Take 1 tablet (0.3 mg total) by mouth 2 (two) times daily. 60 tablet 1  . glipiZIDE (GLUCOTROL) 10 MG tablet Take 1 tablet (10 mg total) by mouth 2 (two) times daily before a meal. 90 tablet 0  . glucose blood (ACCU-CHEK AVIVA) test strip Use as instructed. QS 3 times a day testing. 100 each 12  . hydrochlorothiazide (HYDRODIURIL) 25 MG tablet Take 0.5 tablets (12.5 mg total) by mouth daily. 90 tablet 0  . insulin glargine (LANTUS) 100 unit/mL SOPN Inject 0.4 mLs (40 Units total) into the skin 2 (two) times daily. 10 mL 3  . Lancet Devices (ACCU-CHEK SOFTCLIX) lancets Use as instructed to test 4 times a day.  Dx code 250.00.  May change to fit patients meter 100 each 12  . lisinopril (PRINIVIL,ZESTRIL) 40 MG tablet Take 1 tablet (40 mg total) by mouth daily. 90 tablet 3  . metFORMIN (GLUCOPHAGE) 1000 MG tablet Take 1 tablet (1,000 mg total) by mouth 2 (two) times daily with a meal. 60 tablet 6  . pravastatin (PRAVACHOL) 40 MG tablet Take 1 tablet (40 mg total) by mouth daily. 90 tablet 1  . spironolactone (ALDACTONE) 50 MG tablet Take 1 tablet (50 mg total) by mouth daily. 90 tablet 3  . carvedilol (COREG) 25 MG tablet TAKE 2 TABLETS BY MOUTH TWICE DAILY WITH A MEAL 90 tablet 0  . furosemide (LASIX) 40 MG tablet Take 1-2 tablets (40-80 mg total) by mouth daily. 60 tablet 0    Objective: BP 182/79 mmHg  Pulse 81  Temp(Src) 100.7 F (38.2 C) (Oral)  Resp 20  SpO2 98% Exam: General: Tired male, non-toxic appearing, lying in bed with a non-rebreather on HEENT: Atraumatic, normocephalic, MMM, oropharynx clear. PERRL, arcus senilis. No nasal discharge noted. No LAD noted Cardiovascular: RRR, no m/r/g noted. 1+ radial pulses. Unable to palpate DP pulses Respiratory: Mild increased WOB. Crackles and rhonchi noted throughout the lung fields  bilaterally. On the NRB, satting 91-100% Abdomen: +BS, Obese, soft, non-tender, non-distended Extremities: 2+ pitting edema on the L, 1-2+ pitting edema on the right. Right foot with well-healed scar- missing his 4th and 5th digits on the foot. Callus over the lateral aspect of his right foot. Left foot with mild callus on the lateral aspect. Small hyperpigmented erosion over the medial ball of the left foot.  Skin: Hyperpigmented lesion on the tip of the patient's  nose Neuro: Alert and oriented x4. Often gets off topic when questioned (per niece, this is his baseline). Speech clear. Facial movements symmetric. No gross neurologic deficits.  Labs and Imaging: CBC BMET   Recent Labs Lab 12/08/13 1633 12/08/13 1651  WBC 4.1  --   HGB 8.7* 9.5*  HCT 26.5* 28.0*  PLT 125*  --     Recent Labs Lab 12/08/13 1651  NA 140  K 3.3*  CL 103  BUN 21  CREATININE 1.30  GLUCOSE 229*     BNP    Component Value Date/Time   PROBNP 2795.0* 02-21-202015 2105   CXR: patchy bilateral pulmonary infiltrates, left greater than right consistent with pulmonary edema. Probable emphysema  EKG 11/5: NSR, HR 72. Minimal ST depression in II, III, AVF. T wave inversion in V6 and T wave flattening in V5 not noted on previous EKG from 3/20015. QTc 442.   Archie Patten, MD 12/08/2013, 10:10 PM PGY-1, [place] [facility] pager: [phone], text pages welcome  I have seen and evaluated the above patient.  I agree with the note. Addendum in blue.  [facility] PGY-3 Pager: [phone]

## 2013-12-08 NOTE — ED Provider Notes (Signed)
CSN: [id number]     Arrival date & time 12/08/13  1626 History   First MD Initiated Contact with Patient 12/08/13 2025     Chief Complaint  Patient presents with  . Weakness  . Heat Exposure   HPI  Patient is a 63 y.o. Male with a PMH of CAD with 3 vessel CABG, femoral stenting, HL, HTN, PAD, and CHF who presents to the ED with cough and with weakness.  Patient states that he has felt increasingly weak since Tuesday and has also had a bad cough that is intermittently productive for clear sputum.  Patient's niece states that he has been sleeping sitting up and also has worsening swelling in his legs.  Patient states that he has been taking his medications, but does not check his blood sugars.  Per his niece she is unsure whether he is taking his medications.     Past Medical History  Diagnosis Date  . Hypertension   . PAD (peripheral artery disease)   . Gangrene of toe     right 5th/notes 01/04/2013   . High cholesterol   . Type II diabetes mellitus    Past Surgical History  Procedure Laterality Date  . Throat surgery  1983    polyps  . Angioplasty / stenting femoral Right 01/13/2013    superficial femoral artery x 2 (3 mm x 60 mm, 4 mm x 60 mm)  . Tonsillectomy and adenoidectomy  ~ 1965  . Amputation Right 01/28/2013    Procedure: RAY AMPUTATION RIGHT  4th & 5th TOE;  Surgeon: Rosetta Posner, MD;  Location: [facility] OR;  Service: Vascular;  Laterality: Right;  . Coronary artery bypass graft N/A 04/07/2013    Procedure: CORONARY ARTERY BYPASS GRAFTING (CABG);  Surgeon: Ivin Poot, MD;  Location: [place];  Service: Open Heart Surgery;  Laterality: N/A;  . Intraoperative transesophageal echocardiogram N/A 04/07/2013    Procedure: INTRAOPERATIVE TRANSESOPHAGEAL ECHOCARDIOGRAM;  Surgeon: Ivin Poot, MD;  Location: [place];  Service: Open Heart Surgery;  Laterality: N/A;   Family History  Problem Relation Age of Onset  . Cancer Mother     lung cancer  . Hypertension Mother   . Cancer  Sister     patient thinks it was uterine cancer  . Hypertension Sister    History  Substance Use Topics  . Smoking status: Former Smoker -- 1.00 packs/day for 46 years    Types: Cigarettes    Start date: 02/04/1967    Quit date: 02/03/2013  . Smokeless tobacco: Never Used     Comment: Doing well with quitting.  . Alcohol Use: No    Review of Systems  Constitutional: Positive for fatigue. Negative for fever and chills.  Respiratory: Positive for cough and shortness of breath. Negative for chest tightness.   Cardiovascular: Positive for leg swelling. Negative for chest pain and palpitations.  Gastrointestinal: Negative for nausea, vomiting, abdominal pain, diarrhea, constipation and blood in stool.  Genitourinary: Negative for urgency, frequency, hematuria and difficulty urinating.  Neurological: Positive for weakness.  All other systems reviewed and are negative.     Allergies  Review of patient's allergies indicates no known allergies.  Home Medications   Prior to Admission medications   Medication Sig Start Date End Date Taking? Authorizing Provider  ACCU-CHEK FASTCLIX LANCETS MISC 40 Units by Does not apply route 2 (two) times daily. 09/20/13  Yes Rosemarie Ax, MD  amLODipine (NORVASC) 10 MG tablet Take 1 tablet (10 mg total) by  mouth daily. 09/15/13  Yes Zigmund Gottron, MD  aspirin 81 MG tablet Take 1 tablet (81 mg total) by mouth daily. 09/15/13  Yes Zigmund Gottron, MD  B-D ULTRAFINE III SHORT PEN 31G X 8 MM MISC USE AS DIRECTED 11/28/13  Yes Rosemarie Ax, MD  Blood Glucose Monitoring Suppl (ACCU-CHEK AVIVA) device Use as instructed 03/07/13 03/07/14 Yes Rosemarie Ax, MD  Blood Pressure Monitoring (ADULT BLOOD PRESSURE CUFF LG) KIT Please measure your blood pressure the same time everyday, at least one time a week. 10/12/13  Yes Rosemarie Ax, MD  carvedilol (COREG) 25 MG tablet Take 50 mg by mouth 2 (two) times daily with a meal.   Yes Historical  Provider, MD  cloNIDine (CATAPRES) 0.3 MG tablet Take 1 tablet (0.3 mg total) by mouth 2 (two) times daily. 09/21/13  Yes Rosemarie Ax, MD  furosemide (LASIX) 40 MG tablet Take 40 mg by mouth daily.   Yes Historical Provider, MD  glipiZIDE (GLUCOTROL) 10 MG tablet Take 1 tablet (10 mg total) by mouth 2 (two) times daily before a meal. 09/15/13  Yes Zigmund Gottron, MD  glucose blood (ACCU-CHEK AVIVA) test strip Use as instructed. QS 3 times a day testing. 09/15/13  Yes Zigmund Gottron, MD  hydrochlorothiazide (HYDRODIURIL) 25 MG tablet Take 0.5 tablets (12.5 mg total) by mouth daily. 10/18/13  Yes Renee A Kuneff, DO  insulin glargine (LANTUS) 100 unit/mL SOPN Inject 0.4 mLs (40 Units total) into the skin 2 (two) times daily. 09/20/13  Yes Rosemarie Ax, MD  ketorolac (ACULAR) 0.5 % ophthalmic solution Place 1 drop into the left eye 4 (four) times daily.   Yes Historical Provider, MD  Lancet Devices [facility]) lancets Use as instructed to test 4 times a day.  Dx code 250.00.  May change to fit patients meter 05/10/13  Yes Rosemarie Ax, MD  lisinopril (PRINIVIL,ZESTRIL) 40 MG tablet Take 1 tablet (40 mg total) by mouth daily. 09/15/13  Yes Zigmund Gottron, MD  metFORMIN (GLUCOPHAGE) 1000 MG tablet Take 1 tablet (1,000 mg total) by mouth 2 (two) times daily with a meal. 08/23/13  Yes Rosemarie Ax, MD  moxifloxacin (VIGAMOX) 0.5 % ophthalmic solution Place 1 drop into the right eye 4 (four) times daily.   Yes Historical Provider, MD  pravastatin (PRAVACHOL) 40 MG tablet Take 1 tablet (40 mg total) by mouth daily. 08/23/13  Yes Rosemarie Ax, MD  prednisoLONE acetate (PRED FORTE) 1 % ophthalmic suspension Place 1 drop into the right eye 4 (four) times daily.   Yes Historical Provider, MD  silver sulfADIAZINE (SILVADENE) 1 % cream Apply 1 application topically daily.   Yes Historical Provider, MD  spironolactone (ALDACTONE) 50 MG tablet Take 1 tablet (50 mg total) by  mouth daily. 08/23/13  Yes Rosemarie Ax, MD  carvedilol (COREG) 25 MG tablet TAKE 2 TABLETS BY MOUTH TWICE DAILY WITH A MEAL 11/28/13   Rosemarie Ax, MD  furosemide (LASIX) 40 MG tablet Take 1-2 tablets (40-80 mg total) by mouth daily. 10/18/13   Renee A Kuneff, DO   BP 182/79 mmHg  Pulse 81  Temp(Src) 100.7 F (38.2 C) (Oral)  Resp 20  SpO2 98% Physical Exam  Constitutional: He is oriented to person, place, and time. He appears well-developed and well-nourished. No distress.  HENT:  Head: Normocephalic and atraumatic.  Mouth/Throat: Oropharynx is clear and moist. No oropharyngeal exudate.  Eyes: Conjunctivae and EOM are normal. Pupils are  equal, round, and reactive to light. No scleral icterus.  Neck: Normal range of motion. Neck supple. No JVD present. No thyromegaly present.  Cardiovascular: Normal rate, regular rhythm, normal heart sounds and intact distal pulses.  Exam reveals no gallop and no friction rub.   No murmur heard. Pulmonary/Chest: Effort normal. No respiratory distress. He has no decreased breath sounds. He has no wheezes. He has rhonchi in the left lower field. He has no rales. He exhibits no tenderness.  Musculoskeletal: Normal range of motion.  Lymphadenopathy:    He has no cervical adenopathy.  Neurological: He is alert and oriented to person, place, and time. He has normal strength. No cranial nerve deficit or sensory deficit. Coordination normal.  Skin: Skin is warm and dry. He is not diaphoretic.  Psychiatric: He has a normal mood and affect. His behavior is normal. Judgment and thought content normal.  Nursing note and vitals reviewed.   ED Course  Procedures (including critical care time) Labs Review Labs Reviewed  CBC - Abnormal; Notable for the following:    RBC 3.24 (*)    Hemoglobin 8.7 (*)    HCT 26.5 (*)    Platelets 125 (*)    All other components within normal limits  PRO B NATRIURETIC PEPTIDE - Abnormal; Notable for the following:     Pro B Natriuretic peptide (BNP) 2795.0 (*)    All other components within normal limits  I-STAT CHEM 8, ED - Abnormal; Notable for the following:    Potassium 3.3 (*)    Glucose, Bld 229 (*)    Hemoglobin 9.5 (*)    HCT 28.0 (*)    All other components within normal limits  URINALYSIS, ROUTINE W REFLEX MICROSCOPIC  BLOOD GAS, ARTERIAL  I-STAT TROPOININ, ED    Imaging Review Dg Chest 2 View  12/08/2013   CLINICAL DATA:  Hypoxia.  EXAM: CHEST  2 VIEW  COMPARISON:  06/22/2013  FINDINGS: There are patchy bilateral pulmonary infiltrates, left greater than right. No effusions. The lungs appear somewhat hyperinflated with flattening of the diaphragm on the lateral view. No acute osseous abnormality. Heart size is within normal limits. CABG.  IMPRESSION: Bilateral pulmonary edema.  Probable emphysema.   Electronically Signed   By: Rozetta Nunnery M.D.   On: 06-22-2013 21:52     EKG Interpretation   Date/Time:  Thursday December 08 2013 16:56:02 EST Ventricular Rate:  72 PR Interval:  180 QRS Duration: 88 QT Interval:  404 QTC Calculation: 442 R Axis:   61 Text Interpretation:  Normal sinus rhythm Nonspecific ST and T wave  abnormality Abnormal ECG Confirmed by ZACKOWSKI  MD, SCOTT [phone]) on  12/08/2013 5:07:18 PM      MDM   Final diagnoses:  Hypoxia  Acute on chronic congestive heart failure, unspecified congestive heart failure type  Fever, unspecified fever cause  Anemia, unspecified anemia type    Patient is a 63 y.o. Male who presents to the ED with weakness and cough.  Physical exam reveals rhonchi in the left lung base>right lung base.  CBC reveals chronic anemia at baseline.  BNP is elevated at 2,795. CXR reveals bilateral pulmonary edema with patchy infiltrates bilaterally.  Possible pneumonia given fever.  EKG unchanged.  Troponin is negative.  Have given 80 mg lasix IV now.  Patient placed on a non-rebreather mask.  ABG is pending.  I spoke with Dr. Lorenso Courier from [facility]  medicine who will admit under Dr. Ree Kida to the stepdown unit as patient may need  bipap if breathing does not improve with lasix therapy.  Patient seen by and discussed with Dr. Canary Brim who agrees with the above workup and plan.      Cherylann Parr, PA-C 12/08/13 [place], MD 12/08/13 2218

## 2013-12-08 NOTE — ED Notes (Signed)
Pt. Feeling weak, no n/v, no cp, no sob. States, "Tuesday, house was sprayed for bugs; went back 2 hrs. Later and heat was up."  Feeling weak - "not right."

## 2013-12-08 NOTE — Telephone Encounter (Signed)
Doesn't seem to be himself. He did not rest last night.  He is having trouble breathing.

## 2013-12-08 NOTE — ED Notes (Signed)
Placed pt on 2 liters of oxygen nasal cannula due to low oxygen saturation Junie Panning, RN notified

## 2013-12-09 DIAGNOSIS — I517 Cardiomegaly: Secondary | ICD-10-CM

## 2013-12-09 DIAGNOSIS — J441 Chronic obstructive pulmonary disease with (acute) exacerbation: Principal | ICD-10-CM

## 2013-12-09 DIAGNOSIS — I509 Heart failure, unspecified: Secondary | ICD-10-CM

## 2013-12-09 LAB — GLUCOSE, CAPILLARY
GLUCOSE-CAPILLARY: 400 mg/dL — AB (ref 70–99)
Glucose-Capillary: 220 mg/dL — ABNORMAL HIGH (ref 70–99)
Glucose-Capillary: 323 mg/dL — ABNORMAL HIGH (ref 70–99)

## 2013-12-09 LAB — MRSA PCR SCREENING: MRSA BY PCR: NEGATIVE

## 2013-12-09 LAB — COMPREHENSIVE METABOLIC PANEL
ALBUMIN: 3.7 g/dL (ref 3.5–5.2)
ALT: 14 U/L (ref 0–53)
ANION GAP: 16 — AB (ref 5–15)
AST: 16 U/L (ref 0–37)
Alkaline Phosphatase: 74 U/L (ref 39–117)
BILIRUBIN TOTAL: 0.4 mg/dL (ref 0.3–1.2)
BUN: 26 mg/dL — ABNORMAL HIGH (ref 6–23)
CO2: 26 mEq/L (ref 19–32)
CREATININE: 1.23 mg/dL (ref 0.50–1.35)
Calcium: 8.8 mg/dL (ref 8.4–10.5)
Chloride: 100 mEq/L (ref 96–112)
GFR calc Af Amer: 71 mL/min — ABNORMAL LOW (ref >90)
GFR calc non Af Amer: 61 mL/min — ABNORMAL LOW (ref >90)
Glucose, Bld: 244 mg/dL — ABNORMAL HIGH (ref 70–99)
Potassium: 3.5 mEq/L — ABNORMAL LOW (ref 3.7–5.3)
Sodium: 142 mEq/L (ref 137–147)
Total Protein: 7.1 g/dL (ref 6.0–8.3)

## 2013-12-09 LAB — I-STAT VENOUS BLOOD GAS, ED
Acid-Base Excess: 2 mmol/L (ref 0.0–2.0)
Bicarbonate: 27.1 mEq/L — ABNORMAL HIGH (ref 20.0–24.0)
O2 Saturation: 57 %
PH VEN: 7.419 — AB (ref 7.250–7.300)
TCO2: 28 mmol/L (ref 0–100)
pCO2, Ven: 41.8 mmHg — ABNORMAL LOW (ref 45.0–50.0)
pO2, Ven: 30 mmHg (ref 30.0–45.0)

## 2013-12-09 LAB — CBG MONITORING, ED
Glucose-Capillary: 219 mg/dL — ABNORMAL HIGH (ref 70–99)
Glucose-Capillary: 188 mg/dL — ABNORMAL HIGH (ref 70–99)

## 2013-12-09 LAB — MAGNESIUM: Magnesium: 1.7 mg/dL (ref 1.5–2.5)

## 2013-12-09 LAB — CBC
HEMATOCRIT: 28.9 % — AB (ref 39.0–52.0)
HEMOGLOBIN: 9.2 g/dL — AB (ref 13.0–17.0)
MCH: 26.2 pg (ref 26.0–34.0)
MCHC: 31.8 g/dL (ref 30.0–36.0)
MCV: 82.3 fL (ref 78.0–100.0)
Platelets: 134 10*3/uL — ABNORMAL LOW (ref 150–400)
RBC: 3.51 MIL/uL — ABNORMAL LOW (ref 4.22–5.81)
RDW: 15.3 % (ref 11.5–15.5)
WBC: 5.1 10*3/uL (ref 4.0–10.5)

## 2013-12-09 LAB — TROPONIN I: Troponin I: <0.3 ng/mL (ref <0.30)

## 2013-12-09 LAB — CREATININE, SERUM
CREATININE: 1.23 mg/dL (ref 0.50–1.35)
GFR calc Af Amer: 71 mL/min — ABNORMAL LOW (ref >90)
GFR, EST NON AFRICAN AMERICAN: 61 mL/min — AB (ref >90)

## 2013-12-09 MED ORDER — INSULIN GLARGINE 100 UNIT/ML ~~LOC~~ SOLN
40.0000 [IU] | Freq: Every day | SUBCUTANEOUS | Status: DC
  Administered 2013-12-09 (×2): 40 [IU] via SUBCUTANEOUS
  Filled 2013-12-09 (×2): qty 0.4

## 2013-12-09 MED ORDER — LISINOPRIL 40 MG PO TABS
40.0000 mg | ORAL_TABLET | Freq: Every day | ORAL | Status: DC
  Administered 2013-12-09 – 2013-12-12 (×4): 40 mg via ORAL
  Filled 2013-12-09: qty 1
  Filled 2013-12-09: qty 2
  Filled 2013-12-09 (×2): qty 1

## 2013-12-09 MED ORDER — FUROSEMIDE 10 MG/ML IJ SOLN
80.0000 mg | Freq: Every day | INTRAMUSCULAR | Status: DC
  Administered 2013-12-09 – 2013-12-10 (×2): 80 mg via INTRAVENOUS
  Filled 2013-12-09 (×2): qty 8

## 2013-12-09 MED ORDER — POTASSIUM CHLORIDE CRYS ER 20 MEQ PO TBCR
40.0000 meq | EXTENDED_RELEASE_TABLET | ORAL | Status: AC
  Administered 2013-12-09 (×2): 40 meq via ORAL
  Filled 2013-12-09 (×2): qty 2

## 2013-12-09 MED ORDER — SODIUM CHLORIDE 0.9 % IJ SOLN
3.0000 mL | INTRAMUSCULAR | Status: DC | PRN
  Administered 2013-12-12: 3 mL via INTRAVENOUS
  Filled 2013-12-09: qty 3

## 2013-12-09 MED ORDER — IPRATROPIUM-ALBUTEROL 0.5-2.5 (3) MG/3ML IN SOLN
3.0000 mL | Freq: Four times a day (QID) | RESPIRATORY_TRACT | Status: DC
  Administered 2013-12-09 (×2): 3 mL via RESPIRATORY_TRACT
  Filled 2013-12-09 (×2): qty 3

## 2013-12-09 MED ORDER — PREDNISONE 20 MG PO TABS
40.0000 mg | ORAL_TABLET | Freq: Every day | ORAL | Status: DC
  Administered 2013-12-09 – 2013-12-11 (×3): 40 mg via ORAL
  Filled 2013-12-09 (×5): qty 2

## 2013-12-09 MED ORDER — ONDANSETRON HCL 4 MG/2ML IJ SOLN
4.0000 mg | Freq: Four times a day (QID) | INTRAMUSCULAR | Status: DC | PRN
  Administered 2013-12-12: 4 mg via INTRAVENOUS
  Filled 2013-12-09: qty 2

## 2013-12-09 MED ORDER — GATIFLOXACIN 0.5 % OP SOLN
1.0000 [drp] | Freq: Four times a day (QID) | OPHTHALMIC | Status: DC
  Administered 2013-12-09 – 2013-12-12 (×14): 1 [drp] via OPHTHALMIC
  Filled 2013-12-09: qty 2.5

## 2013-12-09 MED ORDER — SPIRONOLACTONE 50 MG PO TABS
50.0000 mg | ORAL_TABLET | Freq: Every day | ORAL | Status: DC
  Administered 2013-12-09 – 2013-12-12 (×4): 50 mg via ORAL
  Filled 2013-12-09 (×4): qty 1

## 2013-12-09 MED ORDER — INSULIN ASPART 100 UNIT/ML ~~LOC~~ SOLN
0.0000 [IU] | Freq: Three times a day (TID) | SUBCUTANEOUS | Status: DC
  Administered 2013-12-09: 7 [IU] via SUBCUTANEOUS
  Administered 2013-12-09: 3 [IU] via SUBCUTANEOUS
  Administered 2013-12-09: 2 [IU] via SUBCUTANEOUS
  Filled 2013-12-09: qty 1

## 2013-12-09 MED ORDER — LEVOFLOXACIN IN D5W 750 MG/150ML IV SOLN
750.0000 mg | INTRAVENOUS | Status: DC
  Administered 2013-12-09 – 2013-12-10 (×2): 750 mg via INTRAVENOUS
  Filled 2013-12-09 (×5): qty 150

## 2013-12-09 MED ORDER — INSULIN ASPART 100 UNIT/ML ~~LOC~~ SOLN
0.0000 [IU] | Freq: Every day | SUBCUTANEOUS | Status: DC
  Administered 2013-12-09: 5 [IU] via SUBCUTANEOUS
  Administered 2013-12-10 – 2013-12-11 (×2): 4 [IU] via SUBCUTANEOUS

## 2013-12-09 MED ORDER — ALBUTEROL SULFATE (2.5 MG/3ML) 0.083% IN NEBU
2.5000 mg | INHALATION_SOLUTION | RESPIRATORY_TRACT | Status: DC | PRN
Start: 2013-12-09 — End: 2013-12-12

## 2013-12-09 MED ORDER — KETOROLAC TROMETHAMINE 0.5 % OP SOLN
1.0000 [drp] | Freq: Four times a day (QID) | OPHTHALMIC | Status: DC
  Administered 2013-12-09 – 2013-12-12 (×14): 1 [drp] via OPHTHALMIC
  Filled 2013-12-09: qty 3

## 2013-12-09 MED ORDER — INSULIN GLARGINE 100 UNIT/ML ~~LOC~~ SOLN
40.0000 [IU] | Freq: Two times a day (BID) | SUBCUTANEOUS | Status: DC
  Administered 2013-12-10 – 2013-12-12 (×5): 40 [IU] via SUBCUTANEOUS
  Filled 2013-12-09 (×7): qty 0.4

## 2013-12-09 MED ORDER — CARVEDILOL 25 MG PO TABS
25.0000 mg | ORAL_TABLET | Freq: Two times a day (BID) | ORAL | Status: DC
  Administered 2013-12-09: 25 mg via ORAL
  Filled 2013-12-09 (×5): qty 1

## 2013-12-09 MED ORDER — ASPIRIN EC 81 MG PO TBEC
81.0000 mg | DELAYED_RELEASE_TABLET | Freq: Every day | ORAL | Status: DC
  Administered 2013-12-09 – 2013-12-12 (×4): 81 mg via ORAL
  Filled 2013-12-09 (×4): qty 1

## 2013-12-09 MED ORDER — INSULIN ASPART 100 UNIT/ML ~~LOC~~ SOLN
0.0000 [IU] | Freq: Three times a day (TID) | SUBCUTANEOUS | Status: DC
  Administered 2013-12-10 (×2): 5 [IU] via SUBCUTANEOUS
  Administered 2013-12-10: 8 [IU] via SUBCUTANEOUS
  Administered 2013-12-11: 5 [IU] via SUBCUTANEOUS
  Administered 2013-12-11: 3 [IU] via SUBCUTANEOUS
  Administered 2013-12-11: 8 [IU] via SUBCUTANEOUS
  Administered 2013-12-12: 3 [IU] via SUBCUTANEOUS
  Administered 2013-12-12: 5 [IU] via SUBCUTANEOUS
  Administered 2013-12-12: 2 [IU] via SUBCUTANEOUS

## 2013-12-09 MED ORDER — HEPARIN SODIUM (PORCINE) 5000 UNIT/ML IJ SOLN
5000.0000 [IU] | Freq: Three times a day (TID) | INTRAMUSCULAR | Status: DC
  Administered 2013-12-09 – 2013-12-12 (×12): 5000 [IU] via SUBCUTANEOUS
  Filled 2013-12-09 (×13): qty 1

## 2013-12-09 MED ORDER — AMLODIPINE BESYLATE 10 MG PO TABS
10.0000 mg | ORAL_TABLET | Freq: Every day | ORAL | Status: DC
  Administered 2013-12-09 – 2013-12-12 (×4): 10 mg via ORAL
  Filled 2013-12-09: qty 2
  Filled 2013-12-09 (×3): qty 1

## 2013-12-09 MED ORDER — ACETAMINOPHEN 325 MG PO TABS
650.0000 mg | ORAL_TABLET | ORAL | Status: DC | PRN
Start: 2013-12-09 — End: 2013-12-12
  Administered 2013-12-09 – 2013-12-10 (×2): 650 mg via ORAL
  Filled 2013-12-09 (×2): qty 2

## 2013-12-09 MED ORDER — ATORVASTATIN CALCIUM 40 MG PO TABS
40.0000 mg | ORAL_TABLET | Freq: Every day | ORAL | Status: DC
  Administered 2013-12-09 – 2013-12-11 (×3): 40 mg via ORAL
  Filled 2013-12-09 (×5): qty 1

## 2013-12-09 MED ORDER — IPRATROPIUM-ALBUTEROL 0.5-2.5 (3) MG/3ML IN SOLN
3.0000 mL | RESPIRATORY_TRACT | Status: DC
  Administered 2013-12-09 – 2013-12-10 (×9): 3 mL via RESPIRATORY_TRACT
  Filled 2013-12-09 (×9): qty 3

## 2013-12-09 MED ORDER — CARVEDILOL 25 MG PO TABS
50.0000 mg | ORAL_TABLET | Freq: Two times a day (BID) | ORAL | Status: DC
  Administered 2013-12-09: 50 mg via ORAL
  Filled 2013-12-09 (×3): qty 2

## 2013-12-09 MED ORDER — PREDNISOLONE ACETATE 1 % OP SUSP
1.0000 [drp] | Freq: Four times a day (QID) | OPHTHALMIC | Status: DC
  Administered 2013-12-09 – 2013-12-12 (×15): 1 [drp] via OPHTHALMIC
  Filled 2013-12-09: qty 1

## 2013-12-09 NOTE — ED Notes (Signed)
CBG = 219  RN informed.

## 2013-12-09 NOTE — ED Notes (Signed)
Checked patient blood sugar it was 188 notified RN Hassan Rowan of blood sugar

## 2013-12-09 NOTE — Plan of Care (Signed)
Problem: Consults Goal: Heart Failure Patient Education (See Patient Education module for education specifics.)  Outcome: Completed/Met Date Met:  12/09/13 Living Better with HF

## 2013-12-09 NOTE — Progress Notes (Signed)
  Echocardiogram 2D Echocardiogram has been performed.  Leonard [name] 12/09/2013, 2:38 PM

## 2013-12-09 NOTE — ED Notes (Signed)
Spoke with admitting, inquire pt's 02 sats on RA, 83% on RA about 60 minutes ago and placed back on NRB at 15L/min. 02 sats >94% with NRB.

## 2013-12-09 NOTE — ED Notes (Signed)
Pt eating breakfast 

## 2013-12-09 NOTE — Progress Notes (Signed)
Family Medicine Teaching Service Daily Progress Note Intern Pager: [phone]  [name] Medical record number: [id number] Date of birth: Mar 24, 1950 Age: 63 y.o. Gender: male  Primary Care Provider: Rosemarie Ax, MD Consultants: None Code Status: full code  Pt Overview and Major Events to Date:  11/6 - admit to FPTS for CHF and COPD exacerbation  Assessment and Plan: [name] is a 63 y.o. male presenting with worsening cough and lower extremity swelling bilaterally found to be hypoxic on presentation. PMH is significant for acute diastolic heart failure, CAD s/p 3 vessel CABG in 04/2013, PAD, HTN, HLD, and tobacco abuse (resolved).  #Shortness of breath: most likely 2/2 combination of CHF exacerbation and COPD exacerbation. Last echo in 04/2013 noted an EF 55-60% with grade 2 diastolic dysfunction. BNP grossly elevated to 2795, previously 374. CXR with patchy bilateral pulmonary infiltrates, L>R consistent with pulmonary edema and probably emphysema. Patient has a >40 year of smoking history as well PFTs from 04/2013 that revealed a FEV/FVC ~47%. EKG w/ ST depression in inferior leads and V6 but patient denies any chest pain/diaphoresis.   - Patient currently on non-rebreather, may require BiPAP - monitor resp status closely - s/p Lasix 80mg  IV in the ED. Continue diuresis with Lasix 80mg  IV this AM, then re-assess  - Duonebs Q4H, albuterol q2h prn  - Prednisone 40mg  daily and Levaquin 750mg  daily (currently IV, can switch to PO when respiratory status improving) - Continue home coreg, spironolactone, lisinopril; holding HCTZ given aggressive diuresis - repeat EKG pending - trend troponins - neg x2 - echocardiogram ordered - Daily weights (per outpatient notes dry weight ~222lbs) and I&Os   #Hypertension, uncontrolled: Currently uncontrolled. Noted to be elevated in previous outpt visits as well.  - holding clonidine 0.3mg  BID due to contraindication in  HF. Holding HCTZ while increasing diuretic therapy. Could consider adding back if needed.  - continue coreg 50mg  BID, amlodipine 10mg  daily, lisinopril 40mg  daily, spironolactone 50mg  daily  - continue to monitor   #Diabetes: Hemoglobin A1c 8.8 on 11/08/13. 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 - f/u CBGs - 188-219 O/N - Lantus 40u BID (given 1 dose last night, consider need for BID dosing) and SSI - Holding Metformin 1000mg  BID and Glipizide 10mg  BID  #CAD s/p CABG and hyperlipidemia: Triglycerides in 11/2012 elevated to 400 - ASA 81mg   - Currently on pravastatin, will switch to Lipitor as patient certainly needs high potency statin given PMH/risk factors.   #Anemia: Appears chronic, baseline around 8.5-10. Iron panels in 02/2013 with elevated ferritin and decreased %sat, otherwise wnl. Patient currently at his baseline - continue to monitor CBCs  #Hypokalemia: K 3.5 on admission s/p Kdur 47mEq - Continue to monitor with BMPs daily  #Hypersomnia with sleep apnea: Per sleep study on 10/11: Moderate snoring with oxygen desaturation to a nadir of 72% and mean saturation only 89.4% on room air with total recording time of 69.4 minutes during which oxygen saturation was less than 88% on room air. Room air saturation on arrival while awake and upright was 98%.  - While pt has previously not been on home O2, he may require O2 during sleeping per previous reports.  FEN/GI: Heart healthy/carb modified diet, SLIV Prophylaxis: subq Heparin  Disposition: pending improvement in respiratory status  Subjective:  Patient reports very little feeling of SOB. Denies CP, abd pain.  Objective: Temp:  [98.9 F (37.2 C)-100.7  F (38.2 C)] 98.9 F (37.2 C) (11/06 0900) Pulse Rate:  [68-89] 85 (11/06 0745) Resp:  [12-27] 18 (11/06 0745) BP: (146-183)/(46-112) 177/73 mmHg (11/06 0745) SpO2:  [85 %-100 %] 94 % (11/06  0745) Weight:  [226 lb (102.513 kg)] 226 lb (102.513 kg) (11/06 0755) Physical Exam: General: Tired male, lying in bed with a non-rebreather on, NAD HEENT: NCAT, MMM, OP clear. PERRL. Cardiovascular: RRR, no m/r/g noted. 1+ radial pulses. Unable to palpate DP pulses, +JVD to level of mandible. Respiratory: Normal WOB. Crackles and wheezes noted throughout the lung fields bilaterally. On 8L NRB, satting 91-98% Abdomen: +BS, Obese, soft, non-tender, non-distended Extremities: 2+ pitting edema b/l. Right foot with well-healed scar- missing his 4th and 5th digits on the foot.  Neuro: Alert and oriented x4. Speech clear. No focal deficits.  Laboratory:  Recent Labs Lab 12/08/13 1633 12/08/13 1651 12/09/13 0144  WBC 4.1  --  5.1  HGB 8.7* 9.5* 9.2*  HCT 26.5* 28.0* 28.9*  PLT 125*  --  134*    Recent Labs Lab 12/08/13 1651 12/09/13 0144  NA 140 142  K 3.3* 3.5*  CL 103 100  CO2  --  26  BUN 21 26*  CREATININE 1.30 1.23  1.23  CALCIUM  --  8.8  PROT  --  7.1  BILITOT  --  0.4  ALKPHOS  --  74  ALT  --  14  AST  --  16  GLUCOSE 229* 244*    BNP (last 3 results)  Recent Labs  04/04/13 0625 12/08/13 2105  PROBNP 374.4* 2795.0*    Imaging/Diagnostic Tests: CXR: patchy bilateral pulmonary infiltrates, left greater than right consistent with pulmonary edema. Probable emphysema  EKG 11/5: NSR, HR 72. Minimal ST depression in II, III, AVF. T wave inversion in V6 and T wave flattening in V5 not noted on previous EKG from 3/20015. QTc 442.   Lavon Paganini, MD 12/09/2013, 9:30 AM PGY-1, [place] Intern pager: [phone], text pages welcome

## 2013-12-09 NOTE — ED Notes (Signed)
Spoke with admitting MD regarding pt's VBG, OK with keeping pt on NRB at this time.

## 2013-12-09 NOTE — ED Notes (Signed)
Attempted report 

## 2013-12-09 NOTE — Progress Notes (Signed)
Utilization review completed. Maryan Sivak, RN, BSN. 

## 2013-12-09 NOTE — ED Notes (Signed)
Admitting paged 

## 2013-12-09 NOTE — ED Notes (Signed)
Patient found out of bed, with gown off and urine on the floor.  Patient cleaned, gown placed on patient and patient placed back on monitor.  Patient educated on using call bell for help with ADLs.  Patient with understanding of need for help.

## 2013-12-09 NOTE — ED Notes (Signed)
Dr Ree Kida at bedside.  Decreased NRB to 8L.  Pt O2 at 94%.

## 2013-12-10 DIAGNOSIS — I5032 Chronic diastolic (congestive) heart failure: Secondary | ICD-10-CM | POA: Diagnosis present

## 2013-12-10 LAB — BASIC METABOLIC PANEL
Anion gap: 15 (ref 5–15)
BUN: 34 mg/dL — AB (ref 6–23)
CHLORIDE: 101 meq/L (ref 96–112)
CO2: 25 mEq/L (ref 19–32)
CREATININE: 1.29 mg/dL (ref 0.50–1.35)
Calcium: 8.8 mg/dL (ref 8.4–10.5)
GFR calc Af Amer: 67 mL/min — ABNORMAL LOW (ref >90)
GFR calc non Af Amer: 58 mL/min — ABNORMAL LOW (ref >90)
GLUCOSE: 274 mg/dL — AB (ref 70–99)
Potassium: 3.9 mEq/L (ref 3.7–5.3)
Sodium: 141 mEq/L (ref 137–147)

## 2013-12-10 LAB — GLUCOSE, CAPILLARY
GLUCOSE-CAPILLARY: 343 mg/dL — AB (ref 70–99)
Glucose-Capillary: 207 mg/dL — ABNORMAL HIGH (ref 70–99)
Glucose-Capillary: 211 mg/dL — ABNORMAL HIGH (ref 70–99)
Glucose-Capillary: 270 mg/dL — ABNORMAL HIGH (ref 70–99)

## 2013-12-10 MED ORDER — IPRATROPIUM-ALBUTEROL 0.5-2.5 (3) MG/3ML IN SOLN
3.0000 mL | Freq: Four times a day (QID) | RESPIRATORY_TRACT | Status: DC
  Administered 2013-12-11 – 2013-12-12 (×6): 3 mL via RESPIRATORY_TRACT
  Filled 2013-12-10 (×6): qty 3

## 2013-12-10 MED ORDER — CARVEDILOL 25 MG PO TABS
50.0000 mg | ORAL_TABLET | Freq: Two times a day (BID) | ORAL | Status: DC
  Administered 2013-12-10 – 2013-12-12 (×6): 50 mg via ORAL
  Filled 2013-12-10 (×7): qty 2

## 2013-12-10 MED ORDER — FUROSEMIDE 10 MG/ML IJ SOLN
80.0000 mg | Freq: Two times a day (BID) | INTRAMUSCULAR | Status: DC
  Administered 2013-12-10: 80 mg via INTRAVENOUS
  Filled 2013-12-10 (×3): qty 8

## 2013-12-10 MED ORDER — HYDRALAZINE HCL 20 MG/ML IJ SOLN
10.0000 mg | Freq: Once | INTRAMUSCULAR | Status: AC
  Administered 2013-12-10: 10 mg via INTRAVENOUS
  Filled 2013-12-10: qty 1

## 2013-12-10 MED ORDER — CETYLPYRIDINIUM CHLORIDE 0.05 % MT LIQD
7.0000 mL | Freq: Two times a day (BID) | OROMUCOSAL | Status: DC
  Administered 2013-12-10 – 2013-12-12 (×4): 7 mL via OROMUCOSAL

## 2013-12-10 NOTE — Progress Notes (Signed)
MD notified pt's BP 189/75. New orders received. Will continue to monitor.

## 2013-12-10 NOTE — Progress Notes (Signed)
Family Medicine Teaching Service Daily Progress Note Intern Pager: [phone]  [name] Medical record number: [id number] Date of birth: 1950/11/28 Age: 63 y.o. Gender: male  Primary Care Provider: Rosemarie Ax, MD Consultants: None Code Status: full code  Pt Overview and Major Events to Date:  11/6 - admit to FPTS for CHF and COPD exacerbation  Assessment and Plan: Leonard [name] is a 63 y.o. male here with multifactorial dyspnea likely from combined CHF and COPD exacerbation. He presented with worsening cough and lower extremity swelling bilaterally and was found to be hypoxic. PMH is significant for acute diastolic heart failure, CAD s/p 3 vessel CABG in 04/2013, PAD, HTN, HLD, and tobacco abuse (resolved).  #Shortness of breath:  - most likely 2/2 combination of CHF exacerbation and COPD exacerbation.  - Last echo in 04/2013 noted an EF 55-60% with grade 2 diastolic dysfunction. BNP 2795 from 374.  - CXR with patchy bilateral pulmonary infiltrates consistent with pulmonary edema and probably emphysema.  - >40 year of smoking history as well PFTs from 04/2013 that revealed a FEV/FVC ~47%.  - O2 req improved, comfortable on 6 L via [place] currently - increase tO BID IV lasix today and hopefully de-escalate to PO tomorrow - Duonebs Q4H, albuterol q2h prn  - Prednisone 40mg  daily and Levaquin 750mg  daily (currently IV, can switch to PO when respiratory status improving) - Continue home coreg, spironolactone, lisinopril; holding HCTZ given aggressive diuresis - repeat EKG pending - trend troponins - neg x2 - TTE 11/6 EF 60-65% and Grade 2 diastolic dysf - Down 1 Kilo and -3.9L since admission  #Hypertension, uncontrolled: Currently uncontrolled. Noted to be elevated in previous outpt visits as well.  - holding clonidine 0.3mg  BID due to contraindication in HF. Holding HCTZ while increasing diuretic therapy.  - Decreased Coereg to 25 BID on Admission- increased  to home dose overnight - continue coreg 50mg  BID, amlodipine 10mg  daily, lisinopril 40mg  daily, spironolactone 50mg  daily  - continue to monitor   #Diabetes: Hemoglobin A1c 8.8 on 11/08/13. 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 - f/u CBGs - 188-400 - Lantus 40u BID at home down to qd here - Increased back to BID given high CBGs - likely worsened by steroids - Holding Metformin 1000mg  BID and Glipizide 10mg  BID  #CAD s/p CABG and hyperlipidemia: Triglycerides in 11/2012 elevated to 400 - ASA 81mg   - Changed to liptor this admission from [facility] given PMH/risk factors.   #Anemia: Appears chronic, baseline around 8.5-10. Iron panels in 02/2013 with elevated ferritin and decreased %sat, otherwise wnl. Patient currently at his baseline - continue to monitor CBCs  #Hypokalemia: K 3.5 on admission, resolved - Continue to monitor with BMPs daily  #Hypersomnia with sleep apnea: Per sleep study on 10/11: Moderate snoring with oxygen desaturation to a nadir of 72% and mean saturation only 89.4% on room air with total recording time of 69.4 minutes during which oxygen saturation was less than 88% on room air. Room air saturation on arrival while awake and upright was 98%.  - While pt has previously not been on home O2, he may require O2 during sleeping per previous reports.  FEN/GI: Heart healthy/carb modified diet, SLIV Prophylaxis: subq Heparin  Disposition: pending improvement in respiratory status  Subjective:  States that he feels relatively the same but denies much feeling of dyspnea. Denies chest pain.   Objective: Temp:  [98.1 F (  36.7 C)-99.8 F (37.7 C)] 99.6 F (37.6 C) (11/07 0744) Pulse Rate:  [67-80] 77 (11/07 0800) Resp:  [19-32] 25 (11/07 0800) BP: (146-189)/(51-75) 180/62 mmHg (11/07 0800) SpO2:  [86 %-97 %] 90 % (11/07 0806) Weight:  [223 lb 12.3 oz (101.5 kg)] 223 lb 12.3 oz (101.5 kg) (11/07  0400) Physical Exam: General: NAD, in bed with [place] on HEENT: NCAT, [place] on Cardiovascular: RRR, no m/r/g noted.  Respiratory: Normal WOB. Decreased at bases with some crackles,  On 6L [place] Abdomen: +BS, Obese, soft, non-tender, non-distended Extremities: trace-1+ pitting edema b/l.  Neuro: Alert and oriented, Speech clear. No focal deficits.  Laboratory:  Recent Labs Lab 12/08/13 1633 12/08/13 1651 12/09/13 0144  WBC 4.1  --  5.1  HGB 8.7* 9.5* 9.2*  HCT 26.5* 28.0* 28.9*  PLT 125*  --  134*    Recent Labs Lab 12/08/13 1651 12/09/13 0144 12/10/13 0236  NA 140 142 141  K 3.3* 3.5* 3.9  CL 103 100 101  CO2  --  26 25  BUN 21 26* 34*  CREATININE 1.30 1.23  1.23 1.29  CALCIUM  --  8.8 8.8  PROT  --  7.1  --   BILITOT  --  0.4  --   ALKPHOS  --  74  --   ALT  --  14  --   AST  --  16  --   GLUCOSE 229* 244* 274*    BNP (last 3 results)  Recent Labs  04/04/13 0625 12/08/13 2105  PROBNP 374.4* 2795.0*    Imaging/Diagnostic Tests: CXR: patchy bilateral pulmonary infiltrates, left greater than right consistent with pulmonary edema. Probable emphysema  EKG 11/5: NSR, HR 72. Minimal ST depression in II, III, AVF. T wave inversion in V6 and T wave flattening in V5 not noted on previous EKG from 3/20015. QTc 442.   Timmothy Euler, MD 12/10/2013, 11:08 AM PGY-3, [place] Intern pager: [phone], text pages welcome

## 2013-12-10 NOTE — Progress Notes (Signed)
Patient found with sats in upper 70's with nasal cannula popped off of the humidification bottle. Placed back on bottle, sats back up in 90's within a minute.

## 2013-12-11 DIAGNOSIS — N189 Chronic kidney disease, unspecified: Secondary | ICD-10-CM

## 2013-12-11 DIAGNOSIS — D631 Anemia in chronic kidney disease: Secondary | ICD-10-CM | POA: Diagnosis present

## 2013-12-11 LAB — BASIC METABOLIC PANEL
Anion gap: 16 — ABNORMAL HIGH (ref 5–15)
BUN: 34 mg/dL — ABNORMAL HIGH (ref 6–23)
CO2: 23 mEq/L (ref 19–32)
Calcium: 8.6 mg/dL (ref 8.4–10.5)
Chloride: 102 mEq/L (ref 96–112)
Creatinine, Ser: 1.3 mg/dL (ref 0.50–1.35)
GFR calc Af Amer: 66 mL/min — ABNORMAL LOW (ref >90)
GFR calc non Af Amer: 57 mL/min — ABNORMAL LOW (ref >90)
Glucose, Bld: 281 mg/dL — ABNORMAL HIGH (ref 70–99)
Potassium: 3.8 mEq/L (ref 3.7–5.3)
Sodium: 141 mEq/L (ref 137–147)

## 2013-12-11 LAB — GLUCOSE, CAPILLARY
Glucose-Capillary: 197 mg/dL — ABNORMAL HIGH (ref 70–99)
Glucose-Capillary: 250 mg/dL — ABNORMAL HIGH (ref 70–99)
Glucose-Capillary: 295 mg/dL — ABNORMAL HIGH (ref 70–99)
Glucose-Capillary: 346 mg/dL — ABNORMAL HIGH (ref 70–99)

## 2013-12-11 LAB — CBC
HCT: 28.2 % — ABNORMAL LOW (ref 39.0–52.0)
Hemoglobin: 9.1 g/dL — ABNORMAL LOW (ref 13.0–17.0)
MCH: 26.5 pg (ref 26.0–34.0)
MCHC: 32.3 g/dL (ref 30.0–36.0)
MCV: 82 fL (ref 78.0–100.0)
Platelets: 137 10*3/uL — ABNORMAL LOW (ref 150–400)
RBC: 3.44 MIL/uL — ABNORMAL LOW (ref 4.22–5.81)
RDW: 14.9 % (ref 11.5–15.5)
WBC: 3.2 10*3/uL — ABNORMAL LOW (ref 4.0–10.5)

## 2013-12-11 MED ORDER — LEVOFLOXACIN 750 MG PO TABS
750.0000 mg | ORAL_TABLET | Freq: Every day | ORAL | Status: DC
  Administered 2013-12-11 – 2013-12-12 (×2): 750 mg via ORAL
  Filled 2013-12-11 (×2): qty 1

## 2013-12-11 MED ORDER — TORSEMIDE 100 MG PO TABS
100.0000 mg | ORAL_TABLET | Freq: Two times a day (BID) | ORAL | Status: DC
  Administered 2013-12-11 (×2): 100 mg via ORAL
  Filled 2013-12-11 (×5): qty 1

## 2013-12-11 NOTE — Progress Notes (Signed)
Family Medicine Teaching Service Daily Progress Note Intern Pager: [phone]  [name] Medical record number: [id number] Date of birth: 1950-09-23 Age: 63 y.o. Gender: male  Primary Care Provider: Rosemarie Ax, MD Consultants: None Code Status: full code  Pt Overview and Major Events to Date:  11/6 - admit to FPTS for CHF and COPD exacerbation  Assessment and Plan: Leonard [name] is a 63 y.o. male here with multifactorial dyspnea likely from combined CHF and COPD exacerbation. He presented with worsening cough and lower extremity swelling bilaterally and was found to be hypoxic. PMH is significant for acute diastolic heart failure, CAD s/p 3 vessel CABG in 04/2013, PAD, HTN, HLD, and tobacco abuse (resolved).  #Shortness of breath:  - most likely 2/2 combination of CHF exacerbation and COPD exacerbation.  - O2 req improved, now on 2 L , New sat goals 88% with Dx of COPD - Change BID IV lasix to PO Torsemide  - Duonebs Q4H, albuterol q2h prn  - Prednisone 40mg  daily and Levaquin 750mg  daily (currently IV, switch to PO) - Continue home coreg, spironolactone, lisinopril; holding HCTZ given aggressive diuresis - repeat EKG pending - trend troponins - neg x2 - repeat TTE EF 60-65% and Grade 2 diastolic dysf - Down 2 Kilos and -5.2L since admission  #Hypertension, uncontrolled: Currently uncontrolled. Noted to be elevated in previous outpt visits as well.  - holding clonidine 0.3mg  BID due to contraindication in HF. Holding HCTZ while increasing diuretic therapy.  - continue coreg 50mg  BID, amlodipine 10mg  daily, lisinopril 40mg  daily, spironolactone 50mg  daily  - continue to monitor   #Diabetes: Hemoglobin A1c 8.8 on 11/08/13. 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 - f/u CBGs - 211-343 - Lantus 40u BID  - likely worsened by steroids - Holding Metformin 1000mg  BID and Glipizide  10mg  BID  #CAD s/p CABG and hyperlipidemia: Triglycerides in 11/2012 elevated to 400 - ASA 81mg   - Changed to liptor this admission from [facility] given PMH/risk factors.   #Anemia: Appears chronic, baseline around 8.5-10. Iron panels in 02/2013 with elevated ferritin and decreased %sat, otherwise wnl. Patient currently at his baseline - stable at baseline, continue to monitor CBCs  #Hypokalemia: K 3.5 on admission, resolved - Continue to monitor with BMPs daily  #Hypersomnia with sleep apnea: Per sleep study on 10/11: Moderate snoring with oxygen desaturation to a nadir of 72% and mean saturation only 89.4% on room air with total recording time of 69.4 minutes during which oxygen saturation was less than 88% on room air. Room air saturation on arrival while awake and upright was 98%.  - While pt has previously not been on home O2, he may require O2 during sleeping per previous reports.  FEN/GI: Heart healthy/carb modified diet, SLIV Prophylaxis: subq Heparin  Disposition: Change to Floor status, continue to wean O2 and transition to PO.   Subjective:  Pt states that he is not back to baseline resp status but feels the same as yesterday, Denies chest pain and is getting good diuresis with lasix.   Objective: Temp:  [97.8 F (36.6 C)-99.6 F (37.6 C)] 99.5 F (37.5 C) (11/08 0300) Pulse Rate:  [62-91] 91 (11/08 0513) Resp:  [18-27] 18 (11/07 1900) BP: (154-180)/(56-78) 176/56 mmHg (11/08 0400) SpO2:  [89 %-100 %] 92 % (11/08 0513) FiO2 (%):  [6 %] 6 % (11/07 1540) Weight:  [218 lb 14.7 oz (99.3 kg)] 218 lb  14.7 oz (99.3 kg) (11/08 0311) Physical Exam: General: NAD, in bed with [place] on HEENT: NCAT, [place] on Cardiovascular: RRR, no m/r/g noted.  Respiratory: Normal WOB. Decreased at bases with some crackles,  On 2L [place] Abdomen: +BS, Obese, soft, non-tender, non-distended Extremities: No edema BL LE Neuro: Alert and oriented, Speech clear. No focal deficits.  Laboratory:  Recent  Labs Lab 12/08/13 1633 12/08/13 1651 12/09/13 0144 12/11/13 0257  WBC 4.1  --  5.1 3.2*  HGB 8.7* 9.5* 9.2* 9.1*  HCT 26.5* 28.0* 28.9* 28.2*  PLT 125*  --  134* 137*    Recent Labs Lab 12/09/13 0144 12/10/13 0236 12/11/13 0257  NA 142 141 141  K 3.5* 3.9 3.8  CL 100 101 102  CO2 26 25 23   BUN 26* 34* 34*  CREATININE 1.23  1.23 1.29 1.30  CALCIUM 8.8 8.8 8.6  PROT 7.1  --   --   BILITOT 0.4  --   --   ALKPHOS 74  --   --   ALT 14  --   --   AST 16  --   --   GLUCOSE 244* 274* 281*    BNP (last 3 results)  Recent Labs  04/04/13 0625 12/08/13 2105  PROBNP 374.4* 2795.0*    Imaging/Diagnostic Tests: CXR: patchy bilateral pulmonary infiltrates, left greater than right consistent with pulmonary edema. Probable emphysema  EKG 11/5: NSR, HR 72. Minimal ST depression in II, III, AVF. T wave inversion in V6 and T wave flattening in V5 not noted on previous EKG from 3/20015. QTc 442.   Timmothy Euler, MD 12/11/2013, 7:06 AM PGY-3, [place] Intern pager: [phone], text pages welcome

## 2013-12-12 DIAGNOSIS — I509 Heart failure, unspecified: Secondary | ICD-10-CM

## 2013-12-12 DIAGNOSIS — J9620 Acute and chronic respiratory failure, unspecified whether with hypoxia or hypercapnia: Secondary | ICD-10-CM | POA: Diagnosis present

## 2013-12-12 LAB — GLUCOSE, CAPILLARY
GLUCOSE-CAPILLARY: 239 mg/dL — AB (ref 70–99)
Glucose-Capillary: 159 mg/dL — ABNORMAL HIGH (ref 70–99)
Glucose-Capillary: 129 mg/dL — ABNORMAL HIGH (ref 70–99)

## 2013-12-12 MED ORDER — TORSEMIDE 100 MG PO TABS
100.0000 mg | ORAL_TABLET | Freq: Every day | ORAL | Status: DC
Start: 2013-12-13 — End: 2013-12-12

## 2013-12-12 MED ORDER — ATORVASTATIN CALCIUM 40 MG PO TABS: 40.0000 mg | ORAL_TABLET | Freq: Every day | ORAL | Status: DC

## 2013-12-12 MED ORDER — LEVOFLOXACIN 750 MG PO TABS
750.0000 mg | ORAL_TABLET | Freq: Every day | ORAL | Status: DC
Start: 2013-12-13 — End: 2014-02-10

## 2013-12-12 MED ORDER — FUROSEMIDE 40 MG PO TABS: 40.0000 mg | ORAL_TABLET | Freq: Every day | ORAL | Status: DC

## 2013-12-12 NOTE — Progress Notes (Signed)
SATURATION QUALIFICATIONS: (This note is used to comply with regulatory documentation for home oxygen)  Patient Saturations on Room Air at Rest = 83-86%  Patient Saturations on Room Air while Ambulating = 77-81%  Patient Saturations on 2 Liters of oxygen while Ambulating =93-94%  Please briefly explain why patient needs home oxygen: Patient needs home oxygen because his oxygen saturation is only 83-86% on room air and lowers even more with movement.

## 2013-12-12 NOTE — Plan of Care (Signed)
Problem: Discharge Progression Outcomes Goal: Activity appropriate for discharge plan Outcome: Adequate for Discharge

## 2013-12-12 NOTE — Plan of Care (Signed)
Problem: Discharge Progression Outcomes Goal: Pain controlled with appropriate interventions Outcome: Completed/Met Date Met:  12/12/13     

## 2013-12-12 NOTE — Progress Notes (Signed)
[id number] Pt.is A/Ox3 and is ambulatory without assistance. He had c/o nausea and prn Zofran IV was given and was effective. Contacted and notified case management about pt's family concerns about his ability to take his medications and self care. Orders placed for home health aide, RN, and social work. Pt.quailified for home oxygen and it was given to patient. Educated patient and his caregiver Jeannene Patella (pt.'s niece) on how to properly use oxygen and what to avoid while using oxygen. Discharge paperwork discussed. IV taken out. Pt.was transported by wheelchair with oxygen.

## 2013-12-12 NOTE — Plan of Care (Signed)
Problem: Phase II Progression Outcomes Goal: Pain controlled Outcome: Completed/Met Date Met:  12/12/13     

## 2013-12-12 NOTE — Plan of Care (Signed)
Problem: Discharge Progression Outcomes Goal: Able to perform self care activities Outcome: Not Met (add Reason) Patient needs assistance with personal care and medications. Pt.has orders to receive home health aide, RN, and Education officer, museum.

## 2013-12-12 NOTE — Plan of Care (Signed)
Problem: Discharge Progression Outcomes Goal: If EF < 40% ACEI/ARB addressed at discharge Outcome: Not Applicable Date Met:  59/29/24

## 2013-12-12 NOTE — Plan of Care (Signed)
Problem: Phase II Progression Outcomes Goal: Fluid volume status improved Outcome: Completed/Met Date Met:  12/12/13

## 2013-12-12 NOTE — Progress Notes (Addendum)
UR completed Kylin Genna K. Nadea Kirkland, RN, BSN, [place], CCM  12/12/2013 2:49 PM

## 2013-12-12 NOTE — Care Management Note (Addendum)
    Page 1 of 2   12/12/2013     11:56:27 AM CARE MANAGEMENT NOTE 12/12/2013  Patient:  Leonard [name], Leonard [name]   Account Number:  [id number]  Date Initiated:  12/12/2013  Documentation initiated by:  Claretha Townshend  Subjective/Objective Assessment:   CHF     Action/Plan:   CM to follow for dispostion needs   Anticipated DC Date:  12/12/2013   Anticipated DC Plan:  [place]  CM consult      [place]   Choice offered to / List presented to:  C-1 Patient        [place] arranged  HH-1 RN  [place]      [place].   Status of service:  Completed, signed off Medicare Important Message given?  YES (If response is "NO", the following Medicare IM given date fields will be blank) Date Medicare IM given:  12/12/2013 Medicare IM given by:  Iziah Cates Date Additional Medicare IM given:   Additional Medicare IM given by:    Discharge Disposition:  [place]  Per UR Regulation:  Reviewed for med. necessity/level of care/duration of stay  If discussed at [place] of Stay Meetings, dates discussed:   12/13/2013    Comments:  Mariann Laster RN, BSN, MSHL, CCM  Nurse - Case Manager,  ([facility])  [phone]  12/01/2013 Social:  From home with family.  Family reports to nurse that issues have been discovered with medications not being taken compliantly and patient not bathing and wearing soiled clothing noting increased forgetfullness. Dispo Plan:  Home with HHS:  RN, Disease and Med MGMT; HSE; HHA; SW Disease MGMT:  CHF Medication [facility] D/c. Medication MGMT:  Assess, monitor and teach.   Assess for other medication tool that will facilitate med compliance and lessen patients risk with self mgmt medication confusion such as Medication box or package dosing options. Please assess patients ability  to self manage medications. HSE: SW:  Hx/o Family reports to nurse that issues have been discovered with medications not being taken compliantly and patient not bathing and wearing soiled clothing noting increased forgetfullness.  Assess for other level of care needs.  Assist with SNF placement within next 30 days post hospital discharge if needed.  Assess for community resources. PCP:  Dr. Clearance Coots

## 2013-12-12 NOTE — Plan of Care (Signed)
Problem: Phase II Progression Outcomes Goal: Dyspnea controlled with activity Outcome: Adequate for Discharge Patient will be discharged with home oxygen

## 2013-12-12 NOTE — Plan of Care (Signed)
Problem: Phase II Progression Outcomes Goal: Begin discharge teaching Outcome: Completed/Met Date Met:  12/12/13

## 2013-12-12 NOTE — Plan of Care (Signed)
Problem: Phase II Progression Outcomes Goal: Discharge plan established Outcome: Completed/Met Date Met:  12/12/13

## 2013-12-12 NOTE — Plan of Care (Signed)
Problem: Discharge Progression Outcomes Goal: Other Discharge Outcomes/Goals Outcome: Not Applicable Date Met:  41/28/78

## 2013-12-12 NOTE — Discharge Summary (Signed)
[facility] Discharge Summary  [name] Medical record number: [id number] Date of birth: Apr 17, 1950 Age: 63 y.o. Gender: male Date of Admission: 12/08/2013  Date of Discharge: 12/12/13 Admitting Physician: Lupita Dawn, MD  Primary Care Provider: Rosemarie Ax, MD Consultants: none  Indication for Hospitalization: dyspnea  Discharge Diagnoses/Problem List:  SOB 2/2 combined CHF exacerbation and COPD exacerbation Hypertension, uncontrolled T2DM CAD s/p CABG HLD Anemia Hypokalemia Hypersomnia with sleep apnea  Disposition: home  Discharge Condition: stable  Discharge Exam: see progress note from day of discharge  Brief Hospital Course:    Leonard [name] is a 63 y.o. male here with multifactorial dyspnea likely from combined CHF and COPD exacerbation. He presented with worsening cough and lower extremity swelling bilaterally and was found to be hypoxic. PMH is significant for acute diastolic heart failure, CAD s/p 3 vessel CABG in 04/2013, PAD, HTN, HLD, and tobacco abuse (resolved).  #Shortness of breath 2/2 combined CHF and COPD exacerbation: Patient presenting with dyspnea and hypoxemia with new O2 requirement up to 15L nonrebreather.  Treated CHF exacerbation initially with IV Lasix 87m BID and decreased to Torsemide PO 102mBID.  Restarted on home Lasix prior to discharge.  Patient down a total of 4kg from admission to discharge.  Repeat 2D Echo showed EF 60-65% and grade 2 diastolic dysfunction.  Patient was continued on home coreg, spironolactone, and lisinopril.  Home HCTZ was held in setting of aggressive diuresis.  Patient had negative ACS rule-out with 3 negative troponins and 2 non-ischemic and unchanged EKGs.  On day after admission, patient was nopted to have wheezing on exam.  Treatment for COPD exacerbation (Prednisone 4068maily adn Levaquin 750m49mily x5 days) initiated in combination with CHF exacerbation  treatment.  Prednisone discontinued after 3 days of therapy, as wheezing greatly improved adn thought to be more CHF than COPD exacerbation.  O2 was weaned to 2L [place] before discharge, and patient discharged on home O2.  #Hypertension, uncontrolled: Patient with elevated BPs to 180s219Xtolic on admission.  Continued on home coreg, amlodipine, lisinopril, and spironolactone.  Home clonidine and HCTZ held during acute CHF exacerbation with aggressive diuresis.  BP more well controlled prior to discharge.  #Diabetes: Hemoglobin A1c 8.8 on 11/08/13. 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  Patient continued on home Lantus 40 units BID, and home Metformin and Glipizide held throughout admission.  CBGs elevated to 300s while on Prednisone, but improving prior to discharge after discontinuing steroids.  Restarted on home regimen at discharge.  #CAD s/p CABG and hyperlipidemia: Continued home ASA 81mg39mly.  Switched statin from Pravastatin to Lipitor, given PMH and risk factors.  #Hypokalemia: K 3.5 on admission, resolved s/p PO repletion. Stable prior to discharge.  All other chronic medical conditions stable during admission and managed with home regimens.  Issues for Follow Up:  1. F/u BP and fluid status: Resuming home Lasix (from Torsemide 100mg 97my) at discharge.  May need to consider restarting one of held home anti-hypertensives (clonidine or HCTZ) for BP control.  2. F/u O2 requirement. Not previously on home O2, discharged on 2L [place].  H/o sleep apnea - may need O2 at least while sleeping chronically. (Per sleep study on 10/11: Moderate snoring with oxygen desaturation to a nadir of 72% and mean saturation only 89.4% on room air with total recording time of 69.4 minutes during which oxygen saturation  was less than 88% on room air. Room air saturation on arrival while awake and upright was 98%.)  3. F/u CBGs - discharged home on  Lantus 40 units BID, Metformin 1079m BID, and Glipizide 11mBID.  Significant Procedures: none  Significant Labs and Imaging:   Recent Labs Lab 12/08/13 1633 12/08/13 1651 12/09/13 0144 12/11/13 0257  WBC 4.1  --  5.1 3.2*  HGB 8.7* 9.5* 9.2* 9.1*  HCT 26.5* 28.0* 28.9* 28.2*  PLT 125*  --  134* 137*    Recent Labs Lab 12/08/13 1651 12/09/13 0144 12/10/13 0236 12/11/13 0257  NA 140 142 141 141  K 3.3* 3.5* 3.9 3.8  CL 103 100 101 102  CO2  --  '26 25 23  ' GLUCOSE 229* 244* 274* 281*  BUN 21 26* 34* 34*  CREATININE 1.30 1.23  1.23 1.29 1.30  CALCIUM  --  8.8 8.8 8.6  MG  --  1.7  --   --   ALKPHOS  --  74  --   --   AST  --  16  --   --   ALT  --  14  --   --   ALBUMIN  --  3.7  --   --     BNP (last 3 results)  Recent Labs  04/04/13 0625 12/08/13 2105  PROBNP 374.4* 2795.0*    Troponin neg x3  CXR: patchy bilateral pulmonary infiltrates, left greater than right consistent with pulmonary edema. Probable emphysema  EKG 11/5: NSR, HR 72. Minimal ST depression in II, III, AVF. T wave inversion in V6 and T wave flattening in V5 not noted on previous EKG from 3/20015. QTc 442.   TTE (11/6) EF 60-65% and Grade 2 diastolic dysf  Results/Tests Pending at Time of Discharge: none  Discharge Medications:    Medication List    STOP taking these medications        cloNIDine 0.3 MG tablet  Commonly known as:  CATAPRES     hydrochlorothiazide 25 MG tablet  Commonly known as:  HYDRODIURIL     pravastatin 40 MG tablet  Commonly known as:  PRAVACHOL      TAKE these medications        ACCU-CHEK AVIVA device  Use as instructed     ACCU-CHEK FASTCLIX LANCETS Misc  40 Units by Does not apply route 2 (two) times daily.     accu-chek softclix lancets  Use as instructed to test 4 times a day.  Dx code 250.00.  May change to fit patients meter     Adult Blood Pressure Cuff Lg Kit  Please measure your blood pressure the same time everyday, at least one time  a week.     amLODipine 10 MG tablet  Commonly known as:  NORVASC  Take 1 tablet (10 mg total) by mouth daily.     aspirin 81 MG tablet  Take 1 tablet (81 mg total) by mouth daily.     atorvastatin 40 MG tablet  Commonly known as:  LIPITOR  Take 1 tablet (40 mg total) by mouth daily at 6 PM.     B-D ULTRAFINE III SHORT PEN 31G X 8 MM Misc  Generic drug:  Insulin Pen Needle  USE AS DIRECTED     carvedilol 25 MG tablet  Commonly known as:  COREG  Take 50 mg by mouth 2 (two) times daily with a meal.     furosemide 40 MG tablet  Commonly known as:  LASIX  Take 1 tablet (40 mg total) by mouth daily.     glipiZIDE 10 MG tablet  Commonly known as:  GLUCOTROL  Take 1 tablet (10 mg total) by mouth 2 (two) times daily before a meal.     glucose blood test strip  Commonly known as:  ACCU-CHEK AVIVA  Use as instructed. QS 3 times a day testing.     insulin glargine 100 unit/mL Sopn  Commonly known as:  LANTUS  Inject 0.4 mLs (40 Units total) into the skin 2 (two) times daily.     ketorolac 0.5 % ophthalmic solution  Commonly known as:  ACULAR  Place 1 drop into the left eye 4 (four) times daily.     levofloxacin 750 MG tablet  Commonly known as:  LEVAQUIN  Take 1 tablet (750 mg total) by mouth daily.  Start taking on:  12/13/2013     lisinopril 40 MG tablet  Commonly known as:  PRINIVIL,ZESTRIL  Take 1 tablet (40 mg total) by mouth daily.     metFORMIN 1000 MG tablet  Commonly known as:  GLUCOPHAGE  Take 1 tablet (1,000 mg total) by mouth 2 (two) times daily with a meal.     moxifloxacin 0.5 % ophthalmic solution  Commonly known as:  VIGAMOX  Place 1 drop into the right eye 4 (four) times daily.     prednisoLONE acetate 1 % ophthalmic suspension  Commonly known as:  PRED FORTE  Place 1 drop into the right eye 4 (four) times daily.     silver sulfADIAZINE 1 % cream  Commonly known as:  SILVADENE  Apply 1 application topically daily.     spironolactone 50 MG tablet   Commonly known as:  ALDACTONE  Take 1 tablet (50 mg total) by mouth daily.        Discharge Instructions: Please refer to Patient Instructions section of EMR for full details.  Patient was counseled important signs and symptoms that should prompt return to medical care, changes in medications, dietary instructions, activity restrictions, and follow up appointments.   Follow-Up Appointments: Follow-up Information    Follow up with Rosemarie Ax, MD On 12/15/2013.   Specialty:  Family Medicine   Why:  Appointment made for hospital follow-up on 11/12 at 1:30pm   Contact information:   [place] [address] [phone]       Follow up with [facility].   Why:  Home Registered Nurse, [place] and Social Work Services to be initiated within 24-48 hours of hospital discharge   Contact information:   [address] [place] [address] [phone]       Lavon Paganini, MD 12/12/2013, 10:20 PM PGY-1, [place]

## 2013-12-12 NOTE — Plan of Care (Signed)
Problem: Phase I Progression Outcomes Goal: Pain controlled with appropriate interventions Outcome: Completed/Met Date Met:  12/12/13 Goal: Voiding-avoid urinary catheter unless indicated Outcome: Completed/Met Date Met:  12/12/13     

## 2013-12-12 NOTE — Plan of Care (Signed)
Problem: Phase I Progression Outcomes Goal: EF % per last Echo/documented,Core Reminder form on chart Outcome: Completed/Met Date Met:  12/12/13 60-65%(12-09-13)

## 2013-12-12 NOTE — Plan of Care (Signed)
Problem: Discharge Progression Outcomes Goal: Complications resolved/controlled Outcome: Adequate for Discharge

## 2013-12-12 NOTE — Plan of Care (Signed)
Problem: Discharge Progression Outcomes Goal: Hemodynamically stable Outcome: Completed/Met Date Met:  12/12/13

## 2013-12-12 NOTE — Plan of Care (Signed)
Problem: Phase II Progression Outcomes Goal: Case manager referral Outcome: Completed/Met Date Met:  12/12/13

## 2013-12-12 NOTE — Plan of Care (Signed)
Problem: Discharge Progression Outcomes Goal: Barriers To Progression Addressed/Resolved Outcome: Completed/Met Date Met:  12/12/13

## 2013-12-12 NOTE — Plan of Care (Signed)
Problem: Phase II Progression Outcomes Goal: Walk in hall or up in chair TID Outcome: Completed/Met Date Met:  12/12/13

## 2013-12-12 NOTE — Discharge Instructions (Signed)
You were admitted for shortness of breath.  We think that this was related to heart failure and COPD.  Please continue to take your medications as prescribed at home.  Heart Failure Heart failure means your heart has trouble pumping blood. This makes it hard for your body to work well. Heart failure is usually a long-term (chronic) condition. You must take good care of yourself and follow your doctor's treatment plan. HOME CARE  Take your heart medicine as told by your doctor.  Do not stop taking medicine unless your doctor tells you to.  Do not skip any dose of medicine.  Refill your medicines before they run out.  Take other medicines only as told by your doctor or pharmacist.  Stay active if told by your doctor. The elderly and people with severe heart failure should talk with a doctor about physical activity.  Eat heart-healthy foods. Choose foods that are without trans fat and are low in saturated fat, cholesterol, and salt (sodium). This includes fresh or frozen fruits and vegetables, fish, lean meats, fat-free or low-fat dairy foods, whole grains, and high-fiber foods. Lentils and dried peas and beans (legumes) are also good choices.  Limit salt if told by your doctor.  Cook in a healthy way. Roast, grill, broil, bake, poach, steam, or stir-fry foods.  Limit fluids as told by your doctor.  Weigh yourself every morning. Do this after you pee (urinate) and before you eat breakfast. Write down your weight to give to your doctor.  Take your blood pressure and write it down if your doctor tells you to.  Ask your doctor how to check your pulse. Check your pulse as told.  Lose weight if told by your doctor.  Stop smoking or chewing tobacco. Do not use gum or patches that help you quit without your doctor's approval.  Schedule and go to doctor visits as told.  Nonpregnant women should have no more than 1 drink a day. Men should have no more than 2 drinks a day. Talk to your  doctor about drinking alcohol.  Stop illegal drug use.  Stay current with shots (immunizations).  Manage your health conditions as told by your doctor.  Learn to manage your stress.  Rest when you are tired.  If it is really hot outside:  Avoid intense activities.  Use air conditioning or fans, or get in a cooler place.  Avoid caffeine and alcohol.  Wear loose-fitting, lightweight, and light-colored clothing.  If it is really cold outside:  Avoid intense activities.  Layer your clothing.  Wear mittens or gloves, a hat, and a scarf when going outside.  Avoid alcohol.  Learn about heart failure and get support as needed.  Get help to maintain or improve your quality of life and your ability to care for yourself as needed. GET HELP IF:   You gain 03 lb/1.4 kg or more in 1 day or 05 lb/2.3 kg in a week.  You are more short of breath than usual.  You cannot do your normal activities.  You tire easily.  You cough more than normal, especially with activity.  You have any or more puffiness (swelling) in areas such as your hands, feet, ankles, or belly (abdomen).  You cannot sleep because it is hard to breathe.  You feel like your heart is beating fast (palpitations).  You get dizzy or light-headed when you stand up. GET HELP RIGHT AWAY IF:   You have trouble breathing.  There is a change in  mental status, such as becoming less alert or not being able to focus.  You have chest pain or discomfort.  You faint. MAKE SURE YOU:   Understand these instructions.  Will watch your condition.  Will get help right away if you are not doing well or get worse. Document Released: 10/30/2007 Document Revised: 06/06/2013 Document Reviewed: 03/08/2012 [facility] Patient Information 2015 [place], [place]. This information is not intended to replace advice given to you by your health care provider. Make sure you discuss any questions you have with your health care  provider.

## 2013-12-12 NOTE — Plan of Care (Signed)
Problem: Phase II Progression Outcomes Goal: Other Phase II Outcomes/Goals Outcome: Not Applicable Date Met:  42/87/68

## 2013-12-12 NOTE — Plan of Care (Signed)
Problem: Discharge Progression Outcomes Goal: Tolerating diet Outcome: Completed/Met Date Met:  12/12/13     

## 2013-12-12 NOTE — Progress Notes (Signed)
Family Medicine Teaching Service Daily Progress Note Intern Pager: [phone]  [name] Medical record number: [id number] Date of birth: July 29, 1950 Age: 63 y.o. Gender: male  Primary Care Provider: Rosemarie Ax, MD Consultants: None Code Status: full code  Pt Overview and Major Events to Date:  11/6 - admit to FPTS for CHF and COPD exacerbation  Assessment and Plan: Leonard [name] is a 63 y.o. male here with multifactorial dyspnea likely from combined CHF and COPD exacerbation. He presented with worsening cough and lower extremity swelling bilaterally and was found to be hypoxic. PMH is significant for acute diastolic heart failure, CAD s/p 3 vessel CABG in 04/2013, PAD, HTN, HLD, and tobacco abuse (resolved).  #Shortness of breath: most likely 2/2 combination of CHF exacerbation and COPD exacerbation.  - O2 req improved, now on 2 L [place], New sat goals 88% with Dx of COPD - Decrease PO Torsemide 100mg  to daily - Duonebs Q6H, albuterol q2h prn - s/p Prednisone 40mg  daily (d/c'd yesterday)  - Continue Levaquin 750mg  daily x5d total - Continue home coreg, spironolactone, lisinopril; holding HCTZ given aggressive diuresis - Troponins neg x3, EKGs non-ischemic - repeat TTE (11/6) EF 60-65% and Grade 2 diastolic dysf - Down 4 Kilos since admission  #Hypertension, uncontrolled: Currently under better control. Noted to be elevated in previous outpt visits as well. - holding clonidine 0.3mg  BID due to contraindication in HF. Holding HCTZ while increasing diuretic therapy.  - continue coreg 50mg  BID, amlodipine 10mg  daily, lisinopril 40mg  daily, spironolactone 50mg  daily  - continue to monitor   #Diabetes: Hemoglobin A1c 8.8 on 11/08/13. 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 - f/u CBGs - 197-346 - Lantus 40u BID  - likely worsened by steroids - now d/c'd - Holding Metformin 1000mg  BID and  Glipizide 10mg  BID  #CAD s/p CABG and hyperlipidemia: Triglycerides in 11/2012 elevated to 400 - ASA 81mg   - Changed to liptor this admission from prava given PMH/risk factors.   #Anemia: Appears chronic, baseline around 8.5-10. Iron panels in 02/2013 with elevated ferritin and decreased %sat, otherwise wnl. Patient currently at his baseline - continue to monitor CBCs  #Hypokalemia: K 3.5 on admission, resolved - Continue to monitor with BMPs daily  #Hypersomnia with sleep apnea: Per sleep study on 10/11: Moderate snoring with oxygen desaturation to a nadir of 72% and mean saturation only 89.4% on room air with total recording time of 69.4 minutes during which oxygen saturation was less than 88% on room air. Room air saturation on arrival while awake and upright was 98%.  - While pt has previously not been on home O2, he may require O2 during sleeping per previous reports.  FEN/GI: Heart healthy/carb modified diet, SLIV Prophylaxis: subq Heparin  Disposition: Home pending wean O2 vs qualification for home O2, possibly later today.  Subjective:  Pt states that resp status is much improved.  Would like to go home soon.  Objective: Temp:  [98.6 F (37 C)-100.2 F (37.9 C)] 100.2 F (37.9 C) (11/09 0708) Pulse Rate:  [70-74] 74 (11/09 0708) Resp:  [18-69] 21 (11/09 0708) BP: (112-168)/(10-68) 137/58 mmHg (11/09 0708) SpO2:  [88 %-94 %] 90 % (11/09 0708) Weight:  [217 lb 9.5 oz (98.7 kg)-222 lb 10.6 oz (101 kg)] 217 lb 9.5 oz (98.7 kg) (11/09 0708) Physical Exam: General: NAD, in bed with 2L [place] on HEENT: NCAT, [place] on Cardiovascular: RRR, no m/r/g noted.  Respiratory: Normal WOB. Decreased at bases with some crackles,  On 2L [place] Abdomen: +BS, Obese, soft, non-tender, non-distended Extremities: No edema BL LE Neuro: Alert and oriented, Speech clear. No focal deficits.  Laboratory:  Recent Labs Lab 12/08/13 1633 12/08/13 1651 12/09/13 0144 12/11/13 0257  WBC 4.1  --  5.1  3.2*  HGB 8.7* 9.5* 9.2* 9.1*  HCT 26.5* 28.0* 28.9* 28.2*  PLT 125*  --  134* 137*    Recent Labs Lab 12/09/13 0144 12/10/13 0236 12/11/13 0257  NA 142 141 141  K 3.5* 3.9 3.8  CL 100 101 102  CO2 26 25 23   BUN 26* 34* 34*  CREATININE 1.23  1.23 1.29 1.30  CALCIUM 8.8 8.8 8.6  PROT 7.1  --   --   BILITOT 0.4  --   --   ALKPHOS 74  --   --   ALT 14  --   --   AST 16  --   --   GLUCOSE 244* 274* 281*    BNP (last 3 results)  Recent Labs  04/04/13 0625 12/08/13 2105  PROBNP 374.4* 2795.0*    Imaging/Diagnostic Tests: CXR: patchy bilateral pulmonary infiltrates, left greater than right consistent with pulmonary edema. Probable emphysema  EKG 11/5: NSR, HR 72. Minimal ST depression in II, III, AVF. T wave inversion in V6 and T wave flattening in V5 not noted on previous EKG from 3/20015. QTc 442.   Lavon Paganini, MD 12/12/2013, 9:37 AM PGY-1, [place] Intern pager: [phone], text pages welcome

## 2013-12-12 NOTE — Plan of Care (Signed)
Problem: Phase II Progression Outcomes Goal: Tolerating diet Outcome: Completed/Met Date Met:  12/12/13     

## 2013-12-12 NOTE — Clinical Documentation Improvement (Signed)
Possible Clinical Conditions? Acute Respiratory Failure Acute on Chronic Respiratory Failure Chronic Respiratory Failure Other Condition Cannot Clinically Determine   Supporting Information: Risk Factors:"COPD Exacerbation & A/C diastolic CHF" Signs & Symptoms:"Hypoxia" Diagnostics:(As per notes)"CXR reveals bilateral pulmonary edema with patchy infiltrates bilaterally".  Treatment: 12/09/13 O2@ 15l/m via  Non Rebreather Mask  Thank You, Alessandra Grout, RN, BSN, CCDS,Clinical Documentation Specialist:  [phone]  [phone]=[facility] [place]- Health Information Management

## 2013-12-15 ENCOUNTER — Encounter: Payer: Self-pay | Admitting: Family Medicine

## 2013-12-15 ENCOUNTER — Ambulatory Visit (INDEPENDENT_AMBULATORY_CARE_PROVIDER_SITE_OTHER): Payer: Medicaid Other | Admitting: Family Medicine

## 2013-12-15 ENCOUNTER — Telehealth: Payer: Self-pay | Admitting: Family Medicine

## 2013-12-15 VITALS — BP 137/69 | HR 64 | Ht 68.0 in | Wt 219.0 lb

## 2013-12-15 DIAGNOSIS — I509 Heart failure, unspecified: Secondary | ICD-10-CM

## 2013-12-15 NOTE — Patient Instructions (Signed)
Thank you for coming in,   Please double the dose of the lasix for the next days.   Call me on Monday to see how he is doing.   Based on the conversation on Monday will determine if we continue the doubled dose of lasix or if we need to do a chest x-ray or do labs.   Please try to have him weigh himself at the beginning of the day, every day.    Please feel free to call with any questions or concerns at any time, at [phone]. --Dr. Raeford Razor

## 2013-12-15 NOTE — Telephone Encounter (Signed)
Pt's Emergency contact calls, pt seen this afternoon by Raeford Razor and would like to know if process for getting home health nurse was started in the hospital or if Dr. Raeford Razor will start the process. Please advise.

## 2013-12-16 ENCOUNTER — Encounter: Payer: Self-pay | Admitting: Clinical

## 2013-12-16 NOTE — Progress Notes (Signed)
[facility] confirmed that they will send a home health aide and social worker today and a skilled nurse tomorrow.  Hunt Oris, MSW, [place]

## 2013-12-16 NOTE — Telephone Encounter (Signed)
From the chart it looks like the [facility] in the hospital setup a [place] Education officer, museum. I have sent a message to [facility] to confirm that the pt is going to be seen.  Hunt Oris, MSW, [place]

## 2013-12-16 NOTE — Telephone Encounter (Signed)
Will forward message to Dr. Raeford Razor and Hunt Oris ([place]) for status of home health.  Burna Forts, BSN, RN-BC

## 2013-12-18 ENCOUNTER — Encounter: Payer: Self-pay | Admitting: Family Medicine

## 2013-12-18 NOTE — Assessment & Plan Note (Signed)
Still requiring O2. Crackles heard on exam. No fevers.  - double the dose of lasix for the next three days.  - call on Monday with his weight.  - get a scale and weigh himself on a daily basis  - call if weight is greater than five pounds from his baseline 220-222lbs.

## 2013-12-18 NOTE — Progress Notes (Signed)
Subjective:    Patient ID: Leonard [name], male    DOB: [birth year], 63 y.o.   MRN: [id number]  HPI  Leonard [name] is here for hospital f/u.   Recently discharged after a CHF and COPD exacerbation. He is finishing up his course of ABX. He was discharged on 2L of [place]. His SOB has improved. Denies any chest pain or fevers. He still fatigued and it takes him longer to get around the house or walk a longer distance.   Current Outpatient Prescriptions on File Prior to Visit  Medication Sig Dispense Refill  . ACCU-CHEK FASTCLIX LANCETS MISC 40 Units by Does not apply route 2 (two) times daily. 1 each 5  . amLODipine (NORVASC) 10 MG tablet Take 1 tablet (10 mg total) by mouth daily. 90 tablet 3  . aspirin 81 MG tablet Take 1 tablet (81 mg total) by mouth daily.    Marland Kitchen atorvastatin (LIPITOR) 40 MG tablet Take 1 tablet (40 mg total) by mouth daily at 6 PM. 30 tablet 0  . B-D ULTRAFINE III SHORT PEN 31G X 8 MM MISC USE AS DIRECTED 100 each 0  . Blood Glucose Monitoring Suppl (ACCU-CHEK AVIVA) device Use as instructed 1 each 0  . Blood Pressure Monitoring (ADULT BLOOD PRESSURE CUFF LG) KIT Please measure your blood pressure the same time everyday, at least one time a week. 1 each 0  . carvedilol (COREG) 25 MG tablet Take 50 mg by mouth 2 (two) times daily with a meal.    . furosemide (LASIX) 40 MG tablet Take 1 tablet (40 mg total) by mouth daily. 60 tablet 0  . glipiZIDE (GLUCOTROL) 10 MG tablet Take 1 tablet (10 mg total) by mouth 2 (two) times daily before a meal. 90 tablet 0  . glucose blood (ACCU-CHEK AVIVA) test strip Use as instructed. QS 3 times a day testing. 100 each 12  . insulin glargine (LANTUS) 100 unit/mL SOPN Inject 0.4 mLs (40 Units total) into the skin 2 (two) times daily. 10 mL 3  . ketorolac (ACULAR) 0.5 % ophthalmic solution Place 1 drop into the left eye 4 (four) times daily.    Elmore Guise Devices (ACCU-CHEK SOFTCLIX) lancets Use as instructed to test 4 times a day.  Dx code  250.00.  May change to fit patients meter 100 each 12  . levofloxacin (LEVAQUIN) 750 MG tablet Take 1 tablet (750 mg total) by mouth daily. 1 tablet 0  . lisinopril (PRINIVIL,ZESTRIL) 40 MG tablet Take 1 tablet (40 mg total) by mouth daily. 90 tablet 3  . metFORMIN (GLUCOPHAGE) 1000 MG tablet Take 1 tablet (1,000 mg total) by mouth 2 (two) times daily with a meal. 60 tablet 6  . moxifloxacin (VIGAMOX) 0.5 % ophthalmic solution Place 1 drop into the right eye 4 (four) times daily.    . prednisoLONE acetate (PRED FORTE) 1 % ophthalmic suspension Place 1 drop into the right eye 4 (four) times daily.    . silver sulfADIAZINE (SILVADENE) 1 % cream Apply 1 application topically daily.    Marland Kitchen spironolactone (ALDACTONE) 50 MG tablet Take 1 tablet (50 mg total) by mouth daily. 90 tablet 3   No current facility-administered medications on file prior to visit.    SHx: stopped smoking   Health Maintenance: received flu vaccine   Review of Systems See hpi     Objective:   Physical Exam BP 137/69 mmHg  Pulse 64  Ht '5\' 8"'  (1.727 m)  Wt 219 lb (99.338  kg)  BMI 33.31 kg/m2  SpO2 96% Gen: NAD, alert, cooperative with exam, wearning [place] set at 2L O2  CV: RRR, good S1/S2, no murmur,  capillary refill brisk  Resp: CTABL, slight crackles in bases  Ext: no edema         Assessment & Plan:

## 2013-12-19 NOTE — Telephone Encounter (Signed)
Wife called to report his weight for the past 3 days. It is still 220. There has been no change Please advise

## 2013-12-20 ENCOUNTER — Telehealth: Payer: Self-pay | Admitting: Clinical

## 2013-12-20 NOTE — Telephone Encounter (Signed)
CSW returned pt's sisters call. Ms. [name] is requesting [facility] for pt. CSW informed Ms. [name] that [facility] would place the form in PCP's box and fax it to [employer] when completed.  Once the form is received by [facility] they will contact pt or sister to setup a home visit. They will determine whether pt qualifies for services.  Hunt Oris, MSW, [place]

## 2013-12-27 ENCOUNTER — Other Ambulatory Visit: Payer: Self-pay | Admitting: Family Medicine

## 2014-01-03 ENCOUNTER — Telehealth: Payer: Self-pay | Admitting: *Deleted

## 2014-01-03 NOTE — Telephone Encounter (Signed)
Pamala Hurry, RN with [facility] called to get verbal order to continue monitor of blood glucose and blood pressure of the home.  She also stated that pt has had diarrhea x 1 week.  She told pt to try Imodium, unsure if pt has tried medication.  Advised that pt call to schedule an appt with PCP.  Please give her a call with verbal order at [phone].  Derl Barrow, RN'

## 2014-01-04 ENCOUNTER — Other Ambulatory Visit: Payer: Self-pay | Admitting: Family Medicine

## 2014-01-04 DIAGNOSIS — E785 Hyperlipidemia, unspecified: Secondary | ICD-10-CM

## 2014-01-12 ENCOUNTER — Encounter (HOSPITAL_COMMUNITY): Payer: Self-pay | Admitting: Vascular Surgery

## 2014-01-17 ENCOUNTER — Other Ambulatory Visit: Payer: Self-pay | Admitting: Family Medicine

## 2014-01-18 ENCOUNTER — Encounter: Payer: Self-pay | Admitting: Family Medicine

## 2014-01-18 ENCOUNTER — Ambulatory Visit (INDEPENDENT_AMBULATORY_CARE_PROVIDER_SITE_OTHER): Payer: Medicaid Other | Admitting: Family Medicine

## 2014-01-18 VITALS — BP 184/74 | HR 102 | Temp 98.5°F | Wt 226.6 lb

## 2014-01-18 DIAGNOSIS — L97529 Non-pressure chronic ulcer of other part of left foot with unspecified severity: Secondary | ICD-10-CM

## 2014-01-18 DIAGNOSIS — E119 Type 2 diabetes mellitus without complications: Secondary | ICD-10-CM

## 2014-01-18 DIAGNOSIS — M7989 Other specified soft tissue disorders: Secondary | ICD-10-CM

## 2014-01-18 DIAGNOSIS — R600 Localized edema: Secondary | ICD-10-CM

## 2014-01-18 DIAGNOSIS — I5041 Acute combined systolic (congestive) and diastolic (congestive) heart failure: Secondary | ICD-10-CM

## 2014-01-18 DIAGNOSIS — I10 Essential (primary) hypertension: Secondary | ICD-10-CM

## 2014-01-18 MED ORDER — FUROSEMIDE 40 MG PO TABS: 40.0000 mg | ORAL_TABLET | Freq: Every day | ORAL | Status: DC

## 2014-01-18 MED ORDER — CARVEDILOL 25 MG PO TABS
50.0000 mg | ORAL_TABLET | Freq: Two times a day (BID) | ORAL | Status: DC
Start: 2014-01-18 — End: 2014-02-06

## 2014-01-18 NOTE — Progress Notes (Signed)
Subjective:    Patient ID: Leonard [name], male    DOB: [birth year], 63 y.o.   MRN: [id number]  HPI  Leonard [name] is here for f/u for HTN and DM.  HTN Disease Monitoring: Home BP Monitoring none Chest pain- no    Dyspnea- yes, home O2 use  Medications: amlodipine, lasix, coreg, lisinopril, aldactone  Compliance-  intermittent. Lightheadedness-  no  Edema- yes, increasing the last few days as he hasn't taken his lasix.   CHRONIC DIABETES  Disease Monitoring  Blood Sugar Ranges: unknown   Polyuria: no   Visual problems: no   Medication Compliance: intermittent   Medication Side Effects  Hypoglycemia: no   Preventitive Health Care  Foot Exam: open wound on left 2nd toe on plantar aspect   Diet pattern: unbalanced   Exercise: wants to walk up and down the block    Current Outpatient Prescriptions on File Prior to Visit  Medication Sig Dispense Refill  . ACCU-CHEK FASTCLIX LANCETS MISC 40 Units by Does not apply route 2 (two) times daily. 1 each 5  . amLODipine (NORVASC) 10 MG tablet Take 1 tablet (10 mg total) by mouth daily. 90 tablet 3  . aspirin 81 MG tablet Take 1 tablet (81 mg total) by mouth daily.    Marland Kitchen atorvastatin (LIPITOR) 40 MG tablet TAKE 1 TABLET BY MOUTH EVERY DAY AT 6 PM 90 tablet 1  . B-D ULTRAFINE III SHORT PEN 31G X 8 MM MISC USE AS DIRECTED 100 each 0  . Blood Glucose Monitoring Suppl (ACCU-CHEK AVIVA) device Use as instructed 1 each 0  . Blood Pressure Monitoring (ADULT BLOOD PRESSURE CUFF LG) KIT Please measure your blood pressure the same time everyday, at least one time a week. 1 each 0  . carvedilol (COREG) 25 MG tablet Take 50 mg by mouth 2 (two) times daily with a meal.    . carvedilol (COREG) 25 MG tablet TAKE 2 TABLETS BY MOUTH TWICE DAILY WITH MEALS 90 tablet 0  . furosemide (LASIX) 40 MG tablet Take 1 tablet (40 mg total) by mouth daily. 60 tablet 0  . glipiZIDE (GLUCOTROL) 10 MG tablet Take 1 tablet (10 mg total) by mouth 2 (two) times daily  before a meal. 90 tablet 0  . glucose blood (ACCU-CHEK AVIVA) test strip Use as instructed. QS 3 times a day testing. 100 each 12  . ketorolac (ACULAR) 0.5 % ophthalmic solution Place 1 drop into the left eye 4 (four) times daily.    Elmore Guise Devices (ACCU-CHEK SOFTCLIX) lancets Use as instructed to test 4 times a day.  Dx code 250.00.  May change to fit patients meter 100 each 12  . LANTUS SOLOSTAR 100 UNIT/ML Solostar Pen INJECT 40 UNITS UNDER THE SKIN TWICE DAILY 15 mL 0  . levofloxacin (LEVAQUIN) 750 MG tablet Take 1 tablet (750 mg total) by mouth daily. 1 tablet 0  . lisinopril (PRINIVIL,ZESTRIL) 40 MG tablet Take 1 tablet (40 mg total) by mouth daily. 90 tablet 3  . metFORMIN (GLUCOPHAGE) 1000 MG tablet Take 1 tablet (1,000 mg total) by mouth 2 (two) times daily with a meal. 60 tablet 6  . moxifloxacin (VIGAMOX) 0.5 % ophthalmic solution Place 1 drop into the right eye 4 (four) times daily.    . prednisoLONE acetate (PRED FORTE) 1 % ophthalmic suspension Place 1 drop into the right eye 4 (four) times daily.    . silver sulfADIAZINE (SILVADENE) 1 % cream Apply 1 application topically daily.    Marland Kitchen  spironolactone (ALDACTONE) 50 MG tablet Take 1 tablet (50 mg total) by mouth daily. 90 tablet 3   No current facility-administered medications on file prior to visit.    Review of Systems See HPI     Objective:   Physical Exam BP 184/74 mmHg  Pulse 102  Temp(Src) 98.5 F (36.9 C) (Oral)  Wt 226 lb 9.6 oz (102.785 kg) Gen: NAD, alert, cooperative with exam CV: tachycardia, good S1/S2, no murmur,  +2 pitting edema, capillary refill brisk  Resp: CTABL, no wheezes, non-labored, no crackles  Skin: open wound on left 2nd toe as pictured below.    BP recheck: 134/86        Assessment & Plan:

## 2014-01-18 NOTE — Patient Instructions (Signed)
Thank you for coming in,   Please take the furosemide everyday. Please call me if you are 5 pounds overweight.   Please put the dressing on the bottom of your toe if you are going to walk. Leave the foot elevated when you are at home. DON'T WALK AROUND BAREFOOT.   Please follow up with me on the 23rd. Regardless if it is a SAME day clinic or not.    Please feel free to call with any questions or concerns at any time, at [phone]. --Dr. Raeford Razor

## 2014-01-20 ENCOUNTER — Encounter: Payer: Self-pay | Admitting: Family Medicine

## 2014-01-20 DIAGNOSIS — L97509 Non-pressure chronic ulcer of other part of unspecified foot with unspecified severity: Secondary | ICD-10-CM | POA: Insufficient documentation

## 2014-01-20 NOTE — Assessment & Plan Note (Signed)
Uncontrolled. No blood sugar readings.  - continue current meds  - open wound on bottom of 2nd toe on left foot.   - if still open at f/u, will refer to wound care.

## 2014-01-20 NOTE — Assessment & Plan Note (Signed)
Controlled after recheck. Having problems with taking medications as needing help at home. Has home health previously established but stopped a couple weeks ago. Will try to establish further help at home.  - continue current medications.

## 2014-01-20 NOTE — Assessment & Plan Note (Signed)
On left 2nd toe. Fairly new and not infected.  - placed gauze and tape over - if looks the same or worse, will refer to wound care.

## 2014-01-20 NOTE — Assessment & Plan Note (Signed)
Weight increase today but reports not taking his lasix the past couple of days. No SOB but using 2L O2 at baseline.  - take regular lasix dose.  - will need to call in if his weight hasn't gone down pack to baseline  - Will also need to try to wean off O2 at f/u. Ambulate and note any desaturations.  - discussed with Dr. Mingo Amber.

## 2014-01-24 ENCOUNTER — Telehealth: Payer: Self-pay | Admitting: *Deleted

## 2014-01-24 DIAGNOSIS — E1165 Type 2 diabetes mellitus with hyperglycemia: Secondary | ICD-10-CM

## 2014-01-24 NOTE — Telephone Encounter (Signed)
[facility] calling they faxed back orders for Skilled nursing at the beginning of the month that needs MD handwritten signature. Form in MD box, please fax back by tomorrow.

## 2014-01-25 ENCOUNTER — Telehealth: Payer: Self-pay | Admitting: Family Medicine

## 2014-01-25 ENCOUNTER — Ambulatory Visit: Payer: Medicaid Other | Admitting: Family Medicine

## 2014-01-25 NOTE — Telephone Encounter (Signed)
Pt called because he had an appointment for today 12/23, but Dr. Raeford Razor was sick and his appointment was changed to 12/28. He would like a refill sent on his insulin because he is unsure if he has enough to last until Monday 12/28.jw

## 2014-01-29 MED ORDER — INSULIN GLARGINE 100 UNIT/ML SOLOSTAR PEN: PEN_INJECTOR | SUBCUTANEOUS | Status: DC

## 2014-01-30 ENCOUNTER — Ambulatory Visit (INDEPENDENT_AMBULATORY_CARE_PROVIDER_SITE_OTHER): Payer: Medicaid Other | Admitting: Family Medicine

## 2014-01-30 ENCOUNTER — Encounter: Payer: Self-pay | Admitting: Family Medicine

## 2014-01-30 VITALS — BP 193/82 | HR 75 | Temp 98.9°F | Ht 68.0 in | Wt 232.0 lb

## 2014-01-30 DIAGNOSIS — R600 Localized edema: Secondary | ICD-10-CM

## 2014-01-30 DIAGNOSIS — L97529 Non-pressure chronic ulcer of other part of left foot with unspecified severity: Secondary | ICD-10-CM

## 2014-01-30 DIAGNOSIS — E1165 Type 2 diabetes mellitus with hyperglycemia: Secondary | ICD-10-CM

## 2014-01-30 NOTE — Progress Notes (Signed)
Subjective:    Patient ID: Leonard [name], male    DOB: 1951/01/05, 63 y.o.   MRN: [id number]  HPI  Leonard [name] is here for wound f/u.   Noted to have open wound on plantar aspect of 2nd toe on left foot. Has been placing gauze over it. Not using neosporin or bacitracin. He has no fevers or chills. Denies any erythema or warmth of his leg.   Leg swelling: continues to have swollen legs. Just re-started taking his lasix after his last app with me. Weight is above average. He denies any trouble breathing, SOB, PND, or orthopena. Still using 2L [place] at baseline. .   Current Outpatient Prescriptions on File Prior to Visit  Medication Sig Dispense Refill  . ACCU-CHEK FASTCLIX LANCETS MISC 40 Units by Does not apply route 2 (two) times daily. 1 each 5  . amLODipine (NORVASC) 10 MG tablet Take 1 tablet (10 mg total) by mouth daily. 90 tablet 3  . aspirin 81 MG tablet Take 1 tablet (81 mg total) by mouth daily.    Marland Kitchen atorvastatin (LIPITOR) 40 MG tablet TAKE 1 TABLET BY MOUTH EVERY DAY AT 6 PM 90 tablet 1  . B-D ULTRAFINE III SHORT PEN 31G X 8 MM MISC USE AS DIRECTED 100 each 0  . Blood Glucose Monitoring Suppl (ACCU-CHEK AVIVA) device Use as instructed 1 each 0  . Blood Pressure Monitoring (ADULT BLOOD PRESSURE CUFF LG) KIT Please measure your blood pressure the same time everyday, at least one time a week. 1 each 0  . carvedilol (COREG) 25 MG tablet Take 2 tablets (50 mg total) by mouth 2 (two) times daily with a meal. 60 tablet 0  . furosemide (LASIX) 40 MG tablet Take 1 tablet (40 mg total) by mouth daily. 60 tablet 2  . glipiZIDE (GLUCOTROL) 10 MG tablet Take 1 tablet (10 mg total) by mouth 2 (two) times daily before a meal. 90 tablet 0  . glucose blood (ACCU-CHEK AVIVA) test strip Use as instructed. QS 3 times a day testing. 100 each 12  . Insulin Glargine (LANTUS SOLOSTAR) 100 UNIT/ML Solostar Pen INJECT 40 UNITS UNDER THE SKIN TWICE DAILY 15 mL 5  . ketorolac (ACULAR) 0.5 % ophthalmic  solution Place 1 drop into the left eye 4 (four) times daily.    Elmore Guise Devices (ACCU-CHEK SOFTCLIX) lancets Use as instructed to test 4 times a day.  Dx code 250.00.  May change to fit patients meter 100 each 12  . levofloxacin (LEVAQUIN) 750 MG tablet Take 1 tablet (750 mg total) by mouth daily. 1 tablet 0  . lisinopril (PRINIVIL,ZESTRIL) 40 MG tablet Take 1 tablet (40 mg total) by mouth daily. 90 tablet 3  . metFORMIN (GLUCOPHAGE) 1000 MG tablet Take 1 tablet (1,000 mg total) by mouth 2 (two) times daily with a meal. 60 tablet 6  . moxifloxacin (VIGAMOX) 0.5 % ophthalmic solution Place 1 drop into the right eye 4 (four) times daily.    . prednisoLONE acetate (PRED FORTE) 1 % ophthalmic suspension Place 1 drop into the right eye 4 (four) times daily.    . silver sulfADIAZINE (SILVADENE) 1 % cream Apply 1 application topically daily.    Marland Kitchen spironolactone (ALDACTONE) 50 MG tablet Take 1 tablet (50 mg total) by mouth daily. 90 tablet 3   No current facility-administered medications on file prior to visit.   Review of Systems SEE HPI     Objective:   Physical Exam BP 193/82 mmHg  Pulse 75  Temp(Src) 98.9 F (37.2 C) (Oral)  Ht _0  (1.727 m)  Wt 232 lb (105.235 kg)  BMI 35.28 kg/m2 Gen: NAD, alert, cooperative with exam, well-appearing CV: RRR, good S1/S2, no murmur, Resp: CTABL, no wheezes, non-labored, no crackles.  Skin: would on plantar aspect is healing. No draining or oozing. No warmth or streaking.  Ext: b/l +2-3 pitting edema.     BP re-check: 174/74     Assessment & Plan:

## 2014-01-30 NOTE — Patient Instructions (Signed)
Thank you for coming in,   Please double your dose of lasix for the next three days. Then call in and tell us your weight.  I have made a referral to podiatry.   Keep me up dated about the home health situation.   Follow up with me in 3 months.    Please feel free to call with any questions or concerns at any time, at [phone]. --Dr. Raeford Razor

## 2014-02-01 NOTE — Assessment & Plan Note (Signed)
Weight above normal. No crackles or trouble breathing. He had been out of his lasix and not taking it prior to seeing me on previous appt. Restarted lasix 40 mg at that appt. He has been compliant since then.  - double lasix for three days from 12/28. Will call back with his weight. Based on that will determine if he needs to continue the double dose for three more days or continue with his normal dose.  - Will try to wean off O2 once he is back to baseline weight. Has been using O2 at baseline since his recent discharge from the hospital.

## 2014-02-01 NOTE — Assessment & Plan Note (Signed)
Healing and no signs of infection. Continue placing guaze to cover.  - will refer to Podiatry for nail clipping due to DM but will also evaluate wound.

## 2014-02-06 ENCOUNTER — Other Ambulatory Visit: Payer: Self-pay | Admitting: Family Medicine

## 2014-02-06 DIAGNOSIS — I5041 Acute combined systolic (congestive) and diastolic (congestive) heart failure: Secondary | ICD-10-CM

## 2014-02-06 MED ORDER — CARVEDILOL 25 MG PO TABS: 50.0000 mg | ORAL_TABLET | Freq: Two times a day (BID) | ORAL | Status: DC

## 2014-02-06 NOTE — Telephone Encounter (Signed)
Pt weight is 232 Needs refill  Carvedilol  25

## 2014-02-10 ENCOUNTER — Encounter (HOSPITAL_COMMUNITY): Payer: Self-pay

## 2014-02-10 ENCOUNTER — Emergency Department (HOSPITAL_COMMUNITY): Payer: Medicaid Other

## 2014-02-10 ENCOUNTER — Inpatient Hospital Stay (HOSPITAL_COMMUNITY)
Admission: EM | Admit: 2014-02-10 | Discharge: 2014-02-13 | DRG: 291 | Disposition: A | Discharge status: Home / Self Care | Payer: Medicaid Other | Attending: Family Medicine | Admitting: Family Medicine

## 2014-02-10 DIAGNOSIS — I509 Heart failure, unspecified: Secondary | ICD-10-CM | POA: Insufficient documentation

## 2014-02-10 DIAGNOSIS — J810 Acute pulmonary edema: Secondary | ICD-10-CM | POA: Insufficient documentation

## 2014-02-10 DIAGNOSIS — I5041 Acute combined systolic (congestive) and diastolic (congestive) heart failure: Secondary | ICD-10-CM

## 2014-02-10 DIAGNOSIS — E876 Hypokalemia: Secondary | ICD-10-CM | POA: Diagnosis not present

## 2014-02-10 DIAGNOSIS — I2510 Atherosclerotic heart disease of native coronary artery without angina pectoris: Secondary | ICD-10-CM | POA: Diagnosis present

## 2014-02-10 DIAGNOSIS — E1169 Type 2 diabetes mellitus with other specified complication: Secondary | ICD-10-CM | POA: Diagnosis present

## 2014-02-10 DIAGNOSIS — I252 Old myocardial infarction: Secondary | ICD-10-CM | POA: Diagnosis not present

## 2014-02-10 DIAGNOSIS — I10 Essential (primary) hypertension: Secondary | ICD-10-CM | POA: Diagnosis present

## 2014-02-10 DIAGNOSIS — I739 Peripheral vascular disease, unspecified: Secondary | ICD-10-CM | POA: Diagnosis present

## 2014-02-10 DIAGNOSIS — Z87891 Personal history of nicotine dependence: Secondary | ICD-10-CM | POA: Diagnosis not present

## 2014-02-10 DIAGNOSIS — I5033 Acute on chronic diastolic (congestive) heart failure: Secondary | ICD-10-CM | POA: Diagnosis present

## 2014-02-10 DIAGNOSIS — Z951 Presence of aortocoronary bypass graft: Secondary | ICD-10-CM

## 2014-02-10 DIAGNOSIS — D649 Anemia, unspecified: Secondary | ICD-10-CM | POA: Diagnosis present

## 2014-02-10 DIAGNOSIS — J9601 Acute respiratory failure with hypoxia: Secondary | ICD-10-CM | POA: Diagnosis present

## 2014-02-10 DIAGNOSIS — M7989 Other specified soft tissue disorders: Secondary | ICD-10-CM | POA: Insufficient documentation

## 2014-02-10 DIAGNOSIS — E782 Mixed hyperlipidemia: Secondary | ICD-10-CM | POA: Diagnosis present

## 2014-02-10 DIAGNOSIS — J80 Acute respiratory distress syndrome: Secondary | ICD-10-CM | POA: Diagnosis present

## 2014-02-10 DIAGNOSIS — R0602 Shortness of breath: Secondary | ICD-10-CM

## 2014-02-10 DIAGNOSIS — E1165 Type 2 diabetes mellitus with hyperglycemia: Secondary | ICD-10-CM | POA: Diagnosis present

## 2014-02-10 LAB — COMPREHENSIVE METABOLIC PANEL
ALT: 30 U/L (ref 0–53)
AST: 26 U/L (ref 0–37)
Albumin: 3.6 g/dL (ref 3.5–5.2)
Alkaline Phosphatase: 73 U/L (ref 39–117)
Anion gap: 8 (ref 5–15)
BUN: 23 mg/dL (ref 6–23)
CALCIUM: 8.7 mg/dL (ref 8.4–10.5)
CO2: 23 mmol/L (ref 19–32)
Chloride: 109 mEq/L (ref 96–112)
Creatinine, Ser: 1.3 mg/dL (ref 0.50–1.35)
GFR calc Af Amer: 66 mL/min — ABNORMAL LOW (ref >90)
GFR calc non Af Amer: 57 mL/min — ABNORMAL LOW (ref >90)
GLUCOSE: 324 mg/dL — AB (ref 70–99)
Potassium: 4.2 mmol/L (ref 3.5–5.1)
SODIUM: 140 mmol/L (ref 135–145)
Total Bilirubin: 0.5 mg/dL (ref 0.3–1.2)
Total Protein: 6.5 g/dL (ref 6.0–8.3)

## 2014-02-10 LAB — CBC WITH DIFFERENTIAL/PLATELET
Basophils Absolute: 0 10*3/uL (ref 0.0–0.1)
Basophils Relative: 0 % (ref 0–1)
EOS ABS: 0.3 10*3/uL (ref 0.0–0.7)
EOS PCT: 5 % (ref 0–5)
HCT: 25 % — ABNORMAL LOW (ref 39.0–52.0)
Hemoglobin: 7.8 g/dL — ABNORMAL LOW (ref 13.0–17.0)
Lymphocytes Relative: 17 % (ref 12–46)
Lymphs Abs: 1.1 10*3/uL (ref 0.7–4.0)
MCH: 25.9 pg — ABNORMAL LOW (ref 26.0–34.0)
MCHC: 31.2 g/dL (ref 30.0–36.0)
MCV: 83.1 fL (ref 78.0–100.0)
MONO ABS: 0.4 10*3/uL (ref 0.1–1.0)
MONOS PCT: 6 % (ref 3–12)
NEUTROS ABS: 4.7 10*3/uL (ref 1.7–7.7)
NEUTROS PCT: 72 % (ref 43–77)
Platelets: 137 10*3/uL — ABNORMAL LOW (ref 150–400)
RBC: 3.01 MIL/uL — AB (ref 4.22–5.81)
RDW: 16.3 % — ABNORMAL HIGH (ref 11.5–15.5)
WBC: 6.4 10*3/uL (ref 4.0–10.5)

## 2014-02-10 LAB — GLUCOSE, CAPILLARY
GLUCOSE-CAPILLARY: 274 mg/dL — AB (ref 70–99)
Glucose-Capillary: 288 mg/dL — ABNORMAL HIGH (ref 70–99)

## 2014-02-10 LAB — MRSA PCR SCREENING: MRSA BY PCR: NEGATIVE

## 2014-02-10 LAB — BRAIN NATRIURETIC PEPTIDE: B NATRIURETIC PEPTIDE 5: 246.9 pg/mL — AB (ref 0.0–100.0)

## 2014-02-10 LAB — TROPONIN I: Troponin I: <0.03 ng/mL (ref <0.031)

## 2014-02-10 MED ORDER — ONDANSETRON HCL 4 MG PO TABS: 4.0000 mg | ORAL_TABLET | Freq: Four times a day (QID) | ORAL | Status: DC | PRN

## 2014-02-10 MED ORDER — SODIUM CHLORIDE 0.9 % IJ SOLN: 3.0000 mL | INTRAMUSCULAR | Status: DC | PRN

## 2014-02-10 MED ORDER — ACETAMINOPHEN 650 MG RE SUPP
650.0000 mg | Freq: Four times a day (QID) | RECTAL | Status: DC | PRN
Start: 2014-02-10 — End: 2014-02-13

## 2014-02-10 MED ORDER — CHLORHEXIDINE GLUCONATE 0.12 % MT SOLN
15.0000 mL | Freq: Two times a day (BID) | OROMUCOSAL | Status: DC
  Administered 2014-02-10 – 2014-02-13 (×6): 15 mL via OROMUCOSAL
  Filled 2014-02-10 (×8): qty 15

## 2014-02-10 MED ORDER — HEPARIN SODIUM (PORCINE) 5000 UNIT/ML IJ SOLN
5000.0000 [IU] | Freq: Three times a day (TID) | INTRAMUSCULAR | Status: DC
  Administered 2014-02-10 – 2014-02-13 (×10): 5000 [IU] via SUBCUTANEOUS
  Filled 2014-02-10 (×12): qty 1

## 2014-02-10 MED ORDER — AMLODIPINE BESYLATE 10 MG PO TABS
10.0000 mg | ORAL_TABLET | Freq: Every day | ORAL | Status: DC
  Administered 2014-02-11 – 2014-02-13 (×3): 10 mg via ORAL
  Filled 2014-02-10 (×3): qty 1

## 2014-02-10 MED ORDER — ASPIRIN 81 MG PO TABS: 81.0000 mg | ORAL_TABLET | Freq: Every day | ORAL | Status: DC

## 2014-02-10 MED ORDER — ONDANSETRON HCL 4 MG/2ML IJ SOLN: 4.0000 mg | Freq: Four times a day (QID) | INTRAMUSCULAR | Status: DC | PRN

## 2014-02-10 MED ORDER — FUROSEMIDE 10 MG/ML IJ SOLN
40.0000 mg | Freq: Two times a day (BID) | INTRAMUSCULAR | Status: AC
  Administered 2014-02-10 – 2014-02-11 (×2): 40 mg via INTRAVENOUS
  Filled 2014-02-10 (×2): qty 4

## 2014-02-10 MED ORDER — SODIUM CHLORIDE 0.9 % IJ SOLN
3.0000 mL | Freq: Two times a day (BID) | INTRAMUSCULAR | Status: DC
  Administered 2014-02-10 – 2014-02-12 (×5): 3 mL via INTRAVENOUS

## 2014-02-10 MED ORDER — INSULIN GLARGINE 100 UNIT/ML ~~LOC~~ SOLN
20.0000 [IU] | Freq: Two times a day (BID) | SUBCUTANEOUS | Status: DC
  Administered 2014-02-10 – 2014-02-11 (×3): 20 [IU] via SUBCUTANEOUS
  Filled 2014-02-10 (×4): qty 0.2

## 2014-02-10 MED ORDER — ATORVASTATIN CALCIUM 40 MG PO TABS
40.0000 mg | ORAL_TABLET | Freq: Every day | ORAL | Status: DC
  Administered 2014-02-11 – 2014-02-13 (×3): 40 mg via ORAL
  Filled 2014-02-10 (×5): qty 1

## 2014-02-10 MED ORDER — CARVEDILOL 25 MG PO TABS
50.0000 mg | ORAL_TABLET | Freq: Two times a day (BID) | ORAL | Status: DC
  Filled 2014-02-10 (×2): qty 2

## 2014-02-10 MED ORDER — ASPIRIN 81 MG PO CHEW
81.0000 mg | CHEWABLE_TABLET | Freq: Every day | ORAL | Status: DC
  Administered 2014-02-11 – 2014-02-13 (×3): 81 mg via ORAL
  Filled 2014-02-10 (×3): qty 1

## 2014-02-10 MED ORDER — FUROSEMIDE 10 MG/ML IJ SOLN
20.0000 mg | Freq: Once | INTRAMUSCULAR | Status: AC
  Administered 2014-02-10: 20 mg via INTRAVENOUS
  Filled 2014-02-10: qty 2

## 2014-02-10 MED ORDER — CETYLPYRIDINIUM CHLORIDE 0.05 % MT LIQD
7.0000 mL | Freq: Two times a day (BID) | OROMUCOSAL | Status: DC
  Administered 2014-02-11 – 2014-02-13 (×5): 7 mL via OROMUCOSAL

## 2014-02-10 MED ORDER — SODIUM CHLORIDE 0.9 % IJ SOLN
3.0000 mL | Freq: Two times a day (BID) | INTRAMUSCULAR | Status: DC
  Administered 2014-02-10: 3 mL via INTRAVENOUS

## 2014-02-10 MED ORDER — ACETAMINOPHEN 325 MG PO TABS: 650.0000 mg | ORAL_TABLET | Freq: Four times a day (QID) | ORAL | Status: DC | PRN

## 2014-02-10 MED ORDER — INSULIN ASPART 100 UNIT/ML ~~LOC~~ SOLN
0.0000 [IU] | Freq: Three times a day (TID) | SUBCUTANEOUS | Status: DC
  Administered 2014-02-10 – 2014-02-11 (×3): 8 [IU] via SUBCUTANEOUS
  Administered 2014-02-11: 11 [IU] via SUBCUTANEOUS
  Administered 2014-02-12 (×2): 3 [IU] via SUBCUTANEOUS
  Administered 2014-02-12: 5 [IU] via SUBCUTANEOUS
  Administered 2014-02-13: 3 [IU] via SUBCUTANEOUS
  Administered 2014-02-13 (×2): 5 [IU] via SUBCUTANEOUS

## 2014-02-10 MED ORDER — SODIUM CHLORIDE 0.9 % IV SOLN: 250.0000 mL | INTRAVENOUS | Status: DC | PRN

## 2014-02-10 NOTE — Progress Notes (Signed)
Pt arrived to unit from ED accompanied by RN and RT. Pt on BiPAP. Pt oriented to room/unit, VS stable. No s/s of acute distress noted.

## 2014-02-10 NOTE — H&P (Signed)
[facility] Admission History and Physical Service Pager: [phone]  [name] Medical record number: [id number] Date of birth: 1950/03/03 Age: 64 y.o. Gender: male  Primary Care Provider: Rosemarie Ax, MD Consultants: none Code Status: Full  Chief Complaint: short of breath  Assessment and Plan: Leonard [name] is a 64 y.o. male presenting with shortness of breath and pulmonary edema. 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  # Pulmonary edema/acute diastolic HF: intially hypoxic in 70-80s on O2 sat, placed on bipap in the ED. Lab CMP wnl except for CBG 324, CBC significant for hgb 7.8 (baseline appears to be around 9), BNP 247, trop negative. CXR bilateral pulmonary edema. EKG largely unremarkable/no changes compared to prior. In ED received $RemoveBef'20mg'YghbQnuSru$  IV lasix. Last echo in November showed EF 60-65% and grade 2 diastolic dysfunction. No obvious cause for acute decompensation. - admit to SDU - telemetry - continue bipap, titrate off as tolerated. O2 to keep oxygen >90% - repeat IV lasix $Remove'40mg'jkCNrzZ$  x 2 doses overnight - strict I&Os, daily weights  # Diabetes: last A1c 11/2013 was 8.8.  - lantus at half home dose - SSI - repeat A1c in AM  # Anemia: baseline >1 year ago appears to be around 9 in early 2015 but does have hgb 10-12 end of 2014 but does not appear to be worked up further. - b12, folate, ferritin - trend CBC  # Hypertension:  - hold coreg, continue amlodipine tomorrow  # HLD: - continue statin  FEN/GI: diet carb mod/heart healthy, saline lock Prophylaxis: heparin sq  Disposition: admit to SDU  History of Present Illness: Leonard [name] is a 64 y.o. male presenting with SOB x 1 day. He states that he woke up this morning and felt fine. He ate breakfast (including some potato chips/cheese curls) and had somewhat rapid onset of  shortness of breath, got worse and came to the ED. He denies any pain, cough, fevers or chills. He does not report any recent leg swelling. He has not missed any doses of his lasix, but has missed doses of his insulin (including lantus this morning). He says he has been peeing normally, but that he did drink 2 liters of soda yesterday. He denies any chest pains, or pain anywhere else. No recent sick contacts.  Review Of Systems: Per HPI with the following additions: none Otherwise 12 point review of systems was performed and was unremarkable.  Patient Active Problem List   Diagnosis Date Noted  . Pulmonary edema 02/10/2014  . Toe ulcer 01/20/2014  . Acute on chronic respiratory failure 12/12/2013  . Absolute anemia   . Congestive heart disease   . Acute exacerbation of CHF (congestive heart failure) 12/09/2013  . COPD exacerbation   . Bilateral lower extremity edema 10/14/2013  . History of tobacco use 08/23/2013  . 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 CAD (coronary artery disease) 04/06/2013  . NSTEMI (non-ST elevated myocardial infarction) 04/05/2013  . PVD - hx of Rt SFA PTA and s/p Rt 4-5th toe amp Dec 2014 04/05/2013  . Acute diastolic CHF (congestive heart failure) 04/04/2013  . Atherosclerosis of native arteries of the extremities with gangrene 02/15/2013  . Diabetic osteomyelitis 01/29/2013  . Normocytic anemia 01/26/2013  . Diabetic ulcer of toe, R 1st plantar surface, viable 01/25/2013  . Gangrene of foot, right fifth metatarsal with osteomyelitis 01/13/2013  .  Essential hypertension, benign 11/25/2012  . Diabetes mellitus, type 2 01/06/2012  . Mixed hyperlipidemia 07/17/2008   Past Medical History: Past Medical History  Diagnosis Date  . Hypertension   . PAD (peripheral artery disease)   . Gangrene of toe     right 5th/notes 01/04/2013   . High cholesterol   . Type II diabetes mellitus    Past Surgical History: Past Surgical History  Procedure Laterality Date  . Throat  surgery  1983    polyps  . Angioplasty / stenting femoral Right 01/13/2013    superficial femoral artery x 2 (3 mm x 60 mm, 4 mm x 60 mm)  . Tonsillectomy and adenoidectomy  ~ 1965  . Amputation Right 01/28/2013    Procedure: RAY AMPUTATION RIGHT  4th & 5th TOE;  Surgeon: Rosetta Posner, MD;  Location: [facility] OR;  Service: Vascular;  Laterality: Right;  . Coronary artery bypass graft N/A 04/07/2013    Procedure: CORONARY ARTERY BYPASS GRAFTING (CABG);  Surgeon: Ivin Poot, MD;  Location: [place];  Service: Open Heart Surgery;  Laterality: N/A;  . Intraoperative transesophageal echocardiogram N/A 04/07/2013    Procedure: INTRAOPERATIVE TRANSESOPHAGEAL ECHOCARDIOGRAM;  Surgeon: Ivin Poot, MD;  Location: [place];  Service: Open Heart Surgery;  Laterality: N/A;  . Lower extremity angiogram Bilateral 01/06/2013    Procedure: LOWER EXTREMITY ANGIOGRAM;  Surgeon: Conrad Gaston, MD;  Location: [facility] CATH LAB;  Service: Cardiovascular;  Laterality: Bilateral;  . Lower extremity angiogram Right 01/13/2013    Procedure: LOWER EXTREMITY ANGIOGRAM;  Surgeon: Conrad [place]aja, MD;  Location: [facility] CATH LAB;  Service: Cardiovascular;  Laterality: Right;  . Left heart catheterization with coronary angiogram N/A 04/05/2013    Procedure: LEFT HEART CATHETERIZATION WITH CORONARY ANGIOGRAM;  Surgeon: Peter M Martinique, MD;  Location: [facility] CATH LAB;  Service: Cardiovascular;  Laterality: N/A;   Social History: History  Substance Use Topics  . Smoking status: Former Smoker -- 1.00 packs/day for 46 years    Types: Cigarettes    Start date: 02/04/1967    Quit date: 02/03/2013  . Smokeless tobacco: Never Used     Comment: Doing well with quitting.  . Alcohol Use: No   Additional social history: none  Please also refer to relevant sections of EMR.  Family History: Family History  Problem Relation Age of Onset  . Cancer Mother     lung cancer  . Hypertension Mother   . Cancer Sister     patient thinks it was uterine cancer   . Hypertension Sister    Allergies and Medications: No Known Allergies No current facility-administered medications on file prior to encounter.   Current Outpatient Prescriptions on File Prior to Encounter  Medication Sig Dispense Refill  . ACCU-CHEK FASTCLIX LANCETS MISC 40 Units by Does not apply route 2 (two) times daily. 1 each 5  . amLODipine (NORVASC) 10 MG tablet Take 1 tablet (10 mg total) by mouth daily. 90 tablet 3  . aspirin 81 MG tablet Take 1 tablet (81 mg total) by mouth daily.    Marland Kitchen atorvastatin (LIPITOR) 40 MG tablet TAKE 1 TABLET BY MOUTH EVERY DAY AT 6 PM 90 tablet 1  . B-D ULTRAFINE III SHORT PEN 31G X 8 MM MISC USE AS DIRECTED 100 each 0  . Blood Glucose Monitoring Suppl (ACCU-CHEK AVIVA) device Use as instructed 1 each 0  . Blood Pressure Monitoring (ADULT BLOOD PRESSURE CUFF LG) KIT Please measure your blood pressure the same time everyday, at  least one time a week. 1 each 0  . carvedilol (COREG) 25 MG tablet Take 2 tablets (50 mg total) by mouth 2 (two) times daily with a meal. 60 tablet 3  . furosemide (LASIX) 40 MG tablet Take 1 tablet (40 mg total) by mouth daily. 60 tablet 2  . glipiZIDE (GLUCOTROL) 10 MG tablet Take 1 tablet (10 mg total) by mouth 2 (two) times daily before a meal. 90 tablet 0  . glucose blood (ACCU-CHEK AVIVA) test strip Use as instructed. QS 3 times a day testing. 100 each 12  . Insulin Glargine (LANTUS SOLOSTAR) 100 UNIT/ML Solostar Pen INJECT 40 UNITS UNDER THE SKIN TWICE DAILY 15 mL 5  . ketorolac (ACULAR) 0.5 % ophthalmic solution Place 1 drop into the left eye 4 (four) times daily.    Elmore Guise Devices (ACCU-CHEK SOFTCLIX) lancets Use as instructed to test 4 times a day.  Dx code 250.00.  May change to fit patients meter 100 each 12  . levofloxacin (LEVAQUIN) 750 MG tablet Take 1 tablet (750 mg total) by mouth daily. 1 tablet 0  . lisinopril (PRINIVIL,ZESTRIL) 40 MG tablet Take 1 tablet (40 mg total) by mouth daily. 90 tablet 3  . metFORMIN  (GLUCOPHAGE) 1000 MG tablet Take 1 tablet (1,000 mg total) by mouth 2 (two) times daily with a meal. 60 tablet 6  . moxifloxacin (VIGAMOX) 0.5 % ophthalmic solution Place 1 drop into the right eye 4 (four) times daily.    . prednisoLONE acetate (PRED FORTE) 1 % ophthalmic suspension Place 1 drop into the right eye 4 (four) times daily.    . silver sulfADIAZINE (SILVADENE) 1 % cream Apply 1 application topically daily.    Marland Kitchen spironolactone (ALDACTONE) 50 MG tablet Take 1 tablet (50 mg total) by mouth daily. 90 tablet 3    Objective: BP 133/58 mmHg  Pulse 66  Temp(Src) 97.7 F (36.5 C) (Oral)  Resp 15  Ht $R'5\' 8"'nu$  (1.727 m)  Wt 226 lb (102.513 kg)  BMI 34.37 kg/m2  SpO2 97% Exam: General: NAD, able to speak full sentences through bipap mask HEENT: Right sided cataract/non reactive to light, left pupil reactive; EOMI Cardiovascular: RRR, normal heart sounds, no appreciable murmur. 2+ radial pulses, unable to palpate DP or PT pulses Respiratory: bibasilar crackles. Normal appearing work of breathing. Abdomen: soft, obese, nontender, nondistended, no organomegaly, normal bowel sounds Extremities: 2+ pitting edema LE bilaterally. WWP. Skin: no rashes Neuro: alert and oriented x3, no focal deficits  Labs and Imaging: CBC BMET   Recent Labs Lab 02/10/14 1045  WBC 6.4  HGB 7.8*  HCT 25.0*  PLT 137*    Recent Labs Lab 02/10/14 1045  NA 140  K 4.2  CL 109  CO2 23  BUN 23  CREATININE 1.30  GLUCOSE 324*  CALCIUM 8.7     BNP 247  Dg Chest Port 1 View  02/10/2014   CLINICAL DATA:  Shortness of breath.  EXAM: PORTABLE CHEST - 1 VIEW  COMPARISON:  December 08, 2013.  FINDINGS: Stable cardiomediastinal silhouette. Status post coronary artery bypass graft. Bilateral perihilar and basilar opacities are noted most consistent with pulmonary edema. No pneumothorax or pleural effusion is noted. Bony thorax is intact.  IMPRESSION: Findings are consistent with bilateral pulmonary edema.    Electronically Signed   By: Sabino Dick M.D.   On: 02/10/2014 10:46     Leone Brand, MD 02/10/2014, 12:29 PM PGY-2, [place] Intern pager: [phone], text pages  welcome

## 2014-02-10 NOTE — ED Notes (Signed)
Attempted report x1. 

## 2014-02-10 NOTE — ED Notes (Signed)
Pt here by ems for sob, from home, pt on cpap on arrival. Pt reproted waking with sob sats in 70's per ems and rales in lower, pt placed on cpap and sats improved to 97, sats ona rrival on room air dropped to 76%, rales lower, wheezing upper, given 2 sl ntg with solumedrol

## 2014-02-10 NOTE — ED Provider Notes (Signed)
CSN: [id number]     Arrival date & time 02/10/14  0941 History   First MD Initiated Contact with Patient 02/10/14 [phone]     Chief Complaint  Patient presents with  . Shortness of Breath     (Consider location/radiation/quality/duration/timing/severity/associated sxs/prior Treatment) Patient is a 64 y.o. male presenting with shortness of breath. The history is provided by the patient.  Shortness of Breath Severity:  Moderate Onset quality:  Sudden Duration:  3 hours Timing:  Constant Progression:  Worsening Chronicity:  Recurrent Context comment:  At rest Relieved by:  Nothing Worsened by:  Nothing tried Ineffective treatments:  None tried Associated symptoms: cough (mild)   Associated symptoms: no abdominal pain, no chest pain, no fever, no headaches, no neck pain and no vomiting     Past Medical History  Diagnosis Date  . Hypertension   . PAD (peripheral artery disease)   . Gangrene of toe     right 5th/notes 01/04/2013   . High cholesterol   . Type II diabetes mellitus    Past Surgical History  Procedure Laterality Date  . Throat surgery  1983    polyps  . Angioplasty / stenting femoral Right 01/13/2013    superficial femoral artery x 2 (3 mm x 60 mm, 4 mm x 60 mm)  . Tonsillectomy and adenoidectomy  ~ 1965  . Amputation Right 01/28/2013    Procedure: RAY AMPUTATION RIGHT  4th & 5th TOE;  Surgeon: Rosetta Posner, MD;  Location: [facility] OR;  Service: Vascular;  Laterality: Right;  . Coronary artery bypass graft N/A 04/07/2013    Procedure: CORONARY ARTERY BYPASS GRAFTING (CABG);  Surgeon: Ivin Poot, MD;  Location: [place];  Service: Open Heart Surgery;  Laterality: N/A;  . Intraoperative transesophageal echocardiogram N/A 04/07/2013    Procedure: INTRAOPERATIVE TRANSESOPHAGEAL ECHOCARDIOGRAM;  Surgeon: Ivin Poot, MD;  Location: [place];  Service: Open Heart Surgery;  Laterality: N/A;  . Lower extremity angiogram Bilateral 01/06/2013    Procedure: LOWER EXTREMITY ANGIOGRAM;   Surgeon: Conrad Unicoi, MD;  Location: [facility] CATH LAB;  Service: Cardiovascular;  Laterality: Bilateral;  . Lower extremity angiogram Right 01/13/2013    Procedure: LOWER EXTREMITY ANGIOGRAM;  Surgeon: Conrad Guthrie, MD;  Location: [facility] CATH LAB;  Service: Cardiovascular;  Laterality: Right;  . Left heart catheterization with coronary angiogram N/A 04/05/2013    Procedure: LEFT HEART CATHETERIZATION WITH CORONARY ANGIOGRAM;  Surgeon: Peter M Martinique, MD;  Location: [facility] CATH LAB;  Service: Cardiovascular;  Laterality: N/A;   Family History  Problem Relation Age of Onset  . Cancer Mother     lung cancer  . Hypertension Mother   . Cancer Sister     patient thinks it was uterine cancer  . Hypertension Sister    History  Substance Use Topics  . Smoking status: Former Smoker -- 1.00 packs/day for 46 years    Types: Cigarettes    Start date: 02/04/1967    Quit date: 02/03/2013  . Smokeless tobacco: Never Used     Comment: Doing well with quitting.  . Alcohol Use: No    Review of Systems  Constitutional: Negative for fever.  HENT: Negative for drooling and rhinorrhea.   Eyes: Negative for pain.  Respiratory: Positive for cough (mild) and shortness of breath.   Cardiovascular: Negative for chest pain and leg swelling.  Gastrointestinal: Negative for nausea, vomiting, abdominal pain and diarrhea.  Genitourinary: Negative for dysuria and hematuria.  Musculoskeletal: Negative for gait problem and neck  pain.  Skin: Negative for color change.  Neurological: Negative for numbness and headaches.  Hematological: Negative for adenopathy.  Psychiatric/Behavioral: Negative for behavioral problems.  All other systems reviewed and are negative.     Allergies  Review of patient's allergies indicates no known allergies.  Home Medications   Prior to Admission medications   Medication Sig Start Date End Date Taking? Authorizing Provider  ACCU-CHEK FASTCLIX LANCETS MISC 40 Units by Does not apply route  2 (two) times daily. 09/20/13   Rosemarie Ax, MD  amLODipine (NORVASC) 10 MG tablet Take 1 tablet (10 mg total) by mouth daily. 09/15/13   Zigmund Gottron, MD  aspirin 81 MG tablet Take 1 tablet (81 mg total) by mouth daily. 09/15/13   Zigmund Gottron, MD  atorvastatin (LIPITOR) 40 MG tablet TAKE 1 TABLET BY MOUTH EVERY DAY AT 6 PM 01/07/14   Rosemarie Ax, MD  B-D ULTRAFINE III SHORT PEN 31G X 8 MM MISC USE AS DIRECTED 01/17/14   Rosemarie Ax, MD  Blood Glucose Monitoring Suppl (ACCU-CHEK AVIVA) device Use as instructed 03/07/13 03/07/14  Rosemarie Ax, MD  Blood Pressure Monitoring (ADULT BLOOD PRESSURE CUFF LG) KIT Please measure your blood pressure the same time everyday, at least one time a week. 10/12/13   Rosemarie Ax, MD  carvedilol (COREG) 25 MG tablet Take 2 tablets (50 mg total) by mouth 2 (two) times daily with a meal. 02/06/14   Rosemarie Ax, MD  furosemide (LASIX) 40 MG tablet Take 1 tablet (40 mg total) by mouth daily. 01/18/14   Rosemarie Ax, MD  glipiZIDE (GLUCOTROL) 10 MG tablet Take 1 tablet (10 mg total) by mouth 2 (two) times daily before a meal. 09/15/13   Zigmund Gottron, MD  glucose blood (ACCU-CHEK AVIVA) test strip Use as instructed. QS 3 times a day testing. 09/15/13   Zigmund Gottron, MD  Insulin Glargine (LANTUS SOLOSTAR) 100 UNIT/ML Solostar Pen INJECT 40 UNITS UNDER THE SKIN TWICE DAILY 01/29/14   Rosemarie Ax, MD  ketorolac (ACULAR) 0.5 % ophthalmic solution Place 1 drop into the left eye 4 (four) times daily.    Historical Provider, MD  Lancet Devices [facility]) lancets Use as instructed to test 4 times a day.  Dx code 250.00.  May change to fit patients meter 05/10/13   Rosemarie Ax, MD  levofloxacin (LEVAQUIN) 750 MG tablet Take 1 tablet (750 mg total) by mouth daily. 12/13/13   Lavon Paganini, MD  lisinopril (PRINIVIL,ZESTRIL) 40 MG tablet Take 1 tablet (40 mg total) by mouth daily. 09/15/13   Zigmund Gottron, MD  metFORMIN (GLUCOPHAGE) 1000 MG tablet Take 1 tablet (1,000 mg total) by mouth 2 (two) times daily with a meal. 08/23/13   Rosemarie Ax, MD  moxifloxacin (VIGAMOX) 0.5 % ophthalmic solution Place 1 drop into the right eye 4 (four) times daily.    Historical Provider, MD  prednisoLONE acetate (PRED FORTE) 1 % ophthalmic suspension Place 1 drop into the right eye 4 (four) times daily.    Historical Provider, MD  silver sulfADIAZINE (SILVADENE) 1 % cream Apply 1 application topically daily.    Historical Provider, MD  spironolactone (ALDACTONE) 50 MG tablet Take 1 tablet (50 mg total) by mouth daily. 08/23/13   Rosemarie Ax, MD   BP 125/57 mmHg  Pulse 72  Temp(Src) 97.7 F (36.5 C) (Oral)  Resp 22  Ht '5\' 8"'  (1.727 m)  Wt 226 lb (  102.513 kg)  BMI 34.37 kg/m2  SpO2 96% Physical Exam  Constitutional: He is oriented to person, place, and time. He appears well-developed and well-nourished. He appears distressed.  HENT:  Head: Normocephalic and atraumatic.  Right Ear: External ear normal.  Left Ear: External ear normal.  Nose: Nose normal.  Mouth/Throat: Oropharynx is clear and moist. No oropharyngeal exudate.  Eyes: Conjunctivae and EOM are normal. Pupils are equal, round, and reactive to light.  Neck: Normal range of motion. Neck supple.  Cardiovascular: Normal rate, regular rhythm, normal heart sounds and intact distal pulses.  Exam reveals no gallop and no friction rub.   No murmur heard. Pulmonary/Chest: He is in respiratory distress.  Diminished breath sounds throughout.  Abdominal: Soft. Bowel sounds are normal. He exhibits no distension. There is no tenderness. There is no rebound and no guarding.  Musculoskeletal: Normal range of motion. He exhibits edema (moderate pitting edema in bilateral lower extremities extending to the knee.). He exhibits no tenderness.  Neurological: He is alert and oriented to person, place, and time.  Skin: Skin is warm and dry.   Psychiatric: He has a normal mood and affect. His behavior is normal.  Nursing note and vitals reviewed.   ED Course  Procedures (including critical care time) Labs Review Labs Reviewed  CBC WITH DIFFERENTIAL - Abnormal; Notable for the following:    RBC 3.01 (*)    Hemoglobin 7.8 (*)    HCT 25.0 (*)    MCH 25.9 (*)    RDW 16.3 (*)    Platelets 137 (*)    All other components within normal limits  COMPREHENSIVE METABOLIC PANEL - Abnormal; Notable for the following:    Glucose, Bld 324 (*)    GFR calc non Af Amer 57 (*)    GFR calc Af Amer 66 (*)    All other components within normal limits  BRAIN NATRIURETIC PEPTIDE - Abnormal; Notable for the following:    B Natriuretic Peptide 246.9 (*)    All other components within normal limits  GLUCOSE, CAPILLARY - Abnormal; Notable for the following:    Glucose-Capillary 274 (*)    All other components within normal limits  CBC - Abnormal; Notable for the following:    RBC 2.96 (*)    Hemoglobin 7.7 (*)    HCT 24.2 (*)    RDW 16.6 (*)    Platelets 135 (*)    All other components within normal limits  BASIC METABOLIC PANEL - Abnormal; Notable for the following:    Glucose, Bld 284 (*)    BUN 32 (*)    Calcium 8.1 (*)    GFR calc non Af Amer 61 (*)    GFR calc Af Amer 70 (*)    All other components within normal limits  GLUCOSE, CAPILLARY - Abnormal; Notable for the following:    Glucose-Capillary 288 (*)    All other components within normal limits  GLUCOSE, CAPILLARY - Abnormal; Notable for the following:    Glucose-Capillary 282 (*)    All other components within normal limits  MRSA PCR SCREENING  TROPONIN I  TROPONIN I  TROPONIN I  TROPONIN I  VITAMIN B12  FOLATE RBC  FERRITIN  HEMOGLOBIN A1C    Imaging Review Dg Chest Port 1 View  02/10/2014   CLINICAL DATA:  Shortness of breath.  EXAM: PORTABLE CHEST - 1 VIEW  COMPARISON:  December 08, 2013.  FINDINGS: Stable cardiomediastinal silhouette. Status post coronary  artery bypass graft. Bilateral perihilar and  basilar opacities are noted most consistent with pulmonary edema. No pneumothorax or pleural effusion is noted. Bony thorax is intact.  IMPRESSION: Findings are consistent with bilateral pulmonary edema.   Electronically Signed   By: Sabino Dick M.D.   On: 02/10/2014 10:46     EKG Interpretation   Date/Time:  Friday February 10 2014 09:48:22 EST Ventricular Rate:  75 PR Interval:  167 QRS Duration: 94 QT Interval:  431 QTC Calculation: 481 R Axis:   50 Text Interpretation:  Sinus rhythm Ventricular premature complex Probable  left atrial enlargement Nonspecific T abnormalities, lateral leads  Borderline prolonged QT interval Otherwise no significant change Confirmed  by Daris Aristizabal  MD, Breeonna Mone ([id number]) on 02/10/2014 9:57:45 AM     CRITICAL CARE Performed by: Pamella Pert, S Total critical care time: 30 min Critical care time was exclusive of separately billable procedures and treating other patients. Critical care was necessary to treat or prevent imminent or life-threatening deterioration. Critical care was time spent personally by me on the following activities: development of treatment plan with patient and/or surrogate as well as nursing, discussions with consultants, evaluation of patient's response to treatment, examination of patient, obtaining history from patient or surrogate, ordering and performing treatments and interventions, ordering and review of laboratory studies, ordering and review of radiographic studies, pulse oximetry and re-evaluation of patient's condition.  MDM   Final diagnoses:  SOB (shortness of breath)  Acute pulmonary edema  Acute respiratory failure with hypoxia    9:59 AM 64 y.o. male w hx of HTN, PAD, DM, CHF s/p CABG who presents with shortness of breath which began around 7 AM this morning and continued to worsen since that time. He states that he slept alright last night but around 7 AM began feeling  short of breath and it significantly worsened. He states that he is on 2 L via nasal cannula at baseline. EMS noted his O2 sats to be in the 70s upon their arrival. He was placed on C-pap. He did have a similar presentation last March. He states that he has been taking his medications as directed and took his Lasix today. He states that he has not taken his insulin today.Marland Kitchen He was also given 2 nitroglycerin and 125 solumedrol by EMS. He is tachypneic on exam with decreased breath sounds. Will place on BiPAP and get screening labs and imaging. Other vital signs otherwise unremarkable. He denies any cp. Will give 106m lasix IV now.   Pt remained on bipap during his stay in the ED. Will admit to stepdown under family practice.     FPamella Pert MD 02/11/14 1010

## 2014-02-10 NOTE — Progress Notes (Signed)
Utilization Review Completed.Donne Anon T1/09/2014

## 2014-02-10 NOTE — ED Notes (Signed)
Family out to nurses station. Concerned about patient left second toe.  Proximal toe slightly darker in color than other toes. Cap refill instant, sensation intact. No redness or discharge noted.

## 2014-02-11 DIAGNOSIS — J810 Acute pulmonary edema: Secondary | ICD-10-CM | POA: Insufficient documentation

## 2014-02-11 DIAGNOSIS — R0602 Shortness of breath: Secondary | ICD-10-CM | POA: Insufficient documentation

## 2014-02-11 DIAGNOSIS — J9601 Acute respiratory failure with hypoxia: Secondary | ICD-10-CM | POA: Insufficient documentation

## 2014-02-11 LAB — FERRITIN: Ferritin: 443 ng/mL — ABNORMAL HIGH (ref 22–322)

## 2014-02-11 LAB — VITAMIN B12: Vitamin B-12: 249 pg/mL (ref 211–911)

## 2014-02-11 LAB — GLUCOSE, CAPILLARY
GLUCOSE-CAPILLARY: 225 mg/dL — AB (ref 70–99)
Glucose-Capillary: 282 mg/dL — ABNORMAL HIGH (ref 70–99)
Glucose-Capillary: 334 mg/dL — ABNORMAL HIGH (ref 70–99)
Glucose-Capillary: 289 mg/dL — ABNORMAL HIGH (ref 70–99)

## 2014-02-11 LAB — BASIC METABOLIC PANEL
Anion gap: 13 (ref 5–15)
BUN: 32 mg/dL — ABNORMAL HIGH (ref 6–23)
CO2: 20 mmol/L (ref 19–32)
Calcium: 8.1 mg/dL — ABNORMAL LOW (ref 8.4–10.5)
Chloride: 107 mEq/L (ref 96–112)
Creatinine, Ser: 1.24 mg/dL (ref 0.50–1.35)
GFR calc Af Amer: 70 mL/min — ABNORMAL LOW (ref >90)
GFR, EST NON AFRICAN AMERICAN: 61 mL/min — AB (ref >90)
Glucose, Bld: 284 mg/dL — ABNORMAL HIGH (ref 70–99)
POTASSIUM: 3.9 mmol/L (ref 3.5–5.1)
Sodium: 140 mmol/L (ref 135–145)

## 2014-02-11 LAB — CBC
HCT: 24.2 % — ABNORMAL LOW (ref 39.0–52.0)
HEMOGLOBIN: 7.7 g/dL — AB (ref 13.0–17.0)
MCH: 26 pg (ref 26.0–34.0)
MCHC: 31.8 g/dL (ref 30.0–36.0)
MCV: 81.8 fL (ref 78.0–100.0)
Platelets: 135 10*3/uL — ABNORMAL LOW (ref 150–400)
RBC: 2.96 MIL/uL — ABNORMAL LOW (ref 4.22–5.81)
RDW: 16.6 % — AB (ref 11.5–15.5)
WBC: 7.6 10*3/uL (ref 4.0–10.5)

## 2014-02-11 LAB — TROPONIN I
Troponin I: <0.03 ng/mL (ref <0.031)
Troponin I: <0.03 ng/mL (ref <0.031)

## 2014-02-11 LAB — HEMOGLOBIN A1C
Hgb A1c MFr Bld: 7.6 % — ABNORMAL HIGH (ref <5.7)
Mean Plasma Glucose: 171 mg/dL — ABNORMAL HIGH (ref <117)

## 2014-02-11 MED ORDER — INSULIN GLARGINE 100 UNIT/ML ~~LOC~~ SOLN
25.0000 [IU] | Freq: Two times a day (BID) | SUBCUTANEOUS | Status: DC
  Administered 2014-02-11 – 2014-02-12 (×2): 25 [IU] via SUBCUTANEOUS
  Filled 2014-02-11 (×3): qty 0.25

## 2014-02-11 MED ORDER — HYDRALAZINE HCL 20 MG/ML IJ SOLN: 10.0000 mg | Freq: Four times a day (QID) | INTRAMUSCULAR | Status: DC | PRN

## 2014-02-11 MED ORDER — FUROSEMIDE 10 MG/ML IJ SOLN
60.0000 mg | Freq: Two times a day (BID) | INTRAMUSCULAR | Status: AC
  Administered 2014-02-11 – 2014-02-12 (×2): 60 mg via INTRAVENOUS
  Filled 2014-02-11 (×2): qty 6

## 2014-02-11 MED ORDER — FUROSEMIDE 10 MG/ML IJ SOLN: 60.0000 mg | Freq: Two times a day (BID) | INTRAMUSCULAR | Status: DC

## 2014-02-11 NOTE — Progress Notes (Signed)
Family at bedside and aware of pt transfer.

## 2014-02-11 NOTE — Progress Notes (Signed)
Family Medicine Teaching Service Daily Progress Note Intern Pager: [phone]  [name] Medical record number: [id number] Date of birth: [birth year] Age: 64 y.o. Gender: male  Primary Care Provider: Rosemarie Ax, MD Consultants: None Code Status: Full  Assessment and Plan: 64 y.o. male presenting with shortness of breath and pulmonary edema. 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  # Pulmonary Edema/acute diastolic HF: hypoxic in ED with 70-80s on O2 sat, placed on bipap in the ED. Echo in November 2015 showed EF 60-65% and grade 2 diastolic dysfunction. - BNP 246.9 - Troponins negative - CXR bilateral pulmonary edema - Currently on 3L [place]. Wean off O2 as appropriate On 2L at baseline - Two doses of Lasix 40mg  given on 02/10/13, once dose of 20mg  in ED  - Will give an additional 60mg  x2  # Diabetes: last A1c 11/2013 was 8.8.  - CBG [id number] - Lantus 20U BID (half of home dose)--Increase to 25U this afternoon - Moderate SSI - Monitor CBG after change in Lantus  # Anemia: baseline >1 year ago appears to be around 9 in early 2015 but does have hgb 10-12 end of 2014. Hemoglobin 7.8 at admission - Hemoglobin 7.7 today - Follow up Vitamin B12, Ferritin, and Folate - Continue to monitor  # Hypertension: hold coreg, continue amlodipine - BP 125-161/56-73 - Hydralazine PRN - Continue to monitor  FEN/GI: diet carb mod/heart healthy, saline lock Prophylaxis: heparin sq Disposition: Admitted to [facility] Medicine Teaching Service  Subjective:  No acute complaints overnight. States he is feeling a little "shaky" and short of breath since oxygen was decreased to 3L 66minutes previously, however he had been exerting himself and giving himself a bedbath. Nurse checked O2 status and O2 98%. Agrees to stay on 3L at this time. Will notify nurse if he develops worsening shortness of  breath. No further complaints at this time.  Objective: Temp:  [97.6 F (36.4 C)-98.8 F (37.1 C)] 98.2 F (36.8 C) (01/09 0705) Pulse Rate:  [63-86] 73 (01/09 0857) Resp:  [15-84] 18 (01/09 0857) BP: (125-161)/(56-73) 159/73 mmHg (01/09 0857) SpO2:  [70 %-100 %] 97 % (01/09 0857) FiO2 (%):  [40 %] 40 % (01/08 1643) Weight:  [226 lb (102.513 kg)-238 lb 8.6 oz (108.2 kg)] 238 lb 8.6 oz (108.2 kg) (01/09 0500) Physical Exam: General: 64yo male standing beside bed giving self bed bath Cardiovascular: S1 and S2 noted. No murmurs/rubs/gallops.  Respiratory: Mild crackles noted in bases bilaterally. No increased work of breathing. Conversing in full sentences Abdomen: Bowel sounds noted. Soft and nondistended. No tenderness to palpation. Extremities: 1+ edema in bilateral lower extremities  Laboratory:  Recent Labs Lab 02/10/14 1045 02/11/14 0754  WBC 6.4 7.6  HGB 7.8* 7.7*  HCT 25.0* 24.2*  PLT 137* 135*    Recent Labs Lab 02/10/14 1045 02/11/14 0754  NA 140 140  K 4.2 3.9  CL 109 107  CO2 23 20  BUN 23 32*  CREATININE 1.30 1.24  CALCIUM 8.7 8.1*  PROT 6.5  --   BILITOT 0.5  --   ALKPHOS 73  --   ALT 30  --   AST 26  --   GLUCOSE 324* 284*   - BNP 246.9 - Troponins negative  Imaging/Diagnostic Tests: Dg Chest Port 1 View  02/10/2014   CLINICAL DATA:  Shortness of breath.  EXAM: PORTABLE CHEST - 1 VIEW  COMPARISON:  December 08, 2013.  FINDINGS: Stable cardiomediastinal silhouette. Status post coronary artery bypass graft. Bilateral perihilar and basilar opacities are noted most consistent with pulmonary edema. No pneumothorax or pleural effusion is noted. Bony thorax is intact.  IMPRESSION: Findings are consistent with bilateral pulmonary edema.   Electronically Signed   By: Sabino Dick M.D.   On: 02/10/2014 10:46   Lorna Few, DO 02/11/2014, 9:14 AM PGY-1, [place] Intern pager: [phone], text pages welcome

## 2014-02-11 NOTE — Progress Notes (Signed)
Attempted to call report

## 2014-02-11 NOTE — Progress Notes (Signed)
Report given to [facility] on 2W.

## 2014-02-12 DIAGNOSIS — I509 Heart failure, unspecified: Secondary | ICD-10-CM | POA: Insufficient documentation

## 2014-02-12 DIAGNOSIS — J810 Acute pulmonary edema: Secondary | ICD-10-CM

## 2014-02-12 DIAGNOSIS — J9601 Acute respiratory failure with hypoxia: Secondary | ICD-10-CM

## 2014-02-12 DIAGNOSIS — R0602 Shortness of breath: Secondary | ICD-10-CM

## 2014-02-12 LAB — GLUCOSE, CAPILLARY
GLUCOSE-CAPILLARY: 196 mg/dL — AB (ref 70–99)
GLUCOSE-CAPILLARY: 198 mg/dL — AB (ref 70–99)
GLUCOSE-CAPILLARY: 230 mg/dL — AB (ref 70–99)
Glucose-Capillary: 275 mg/dL — ABNORMAL HIGH (ref 70–99)

## 2014-02-12 MED ORDER — FUROSEMIDE 10 MG/ML IJ SOLN
40.0000 mg | Freq: Two times a day (BID) | INTRAMUSCULAR | Status: AC
  Administered 2014-02-12: 40 mg via INTRAVENOUS
  Filled 2014-02-12: qty 4

## 2014-02-12 MED ORDER — FUROSEMIDE 10 MG/ML IJ SOLN: 40.0000 mg | Freq: Two times a day (BID) | INTRAMUSCULAR | Status: DC

## 2014-02-12 MED ORDER — FUROSEMIDE 10 MG/ML IJ SOLN: 60.0000 mg | Freq: Two times a day (BID) | INTRAMUSCULAR | Status: DC

## 2014-02-12 MED ORDER — INSULIN GLARGINE 100 UNIT/ML ~~LOC~~ SOLN
30.0000 [IU] | Freq: Two times a day (BID) | SUBCUTANEOUS | Status: DC
  Administered 2014-02-12 – 2014-02-13 (×2): 30 [IU] via SUBCUTANEOUS
  Filled 2014-02-12 (×3): qty 0.3

## 2014-02-12 NOTE — Progress Notes (Signed)
Family Medicine Teaching Service Daily Progress Note Intern Pager: [phone]  [name] Medical record number: [id number] Date of birth: 11/21/1950 Age: 64 y.o. Gender: male  Primary Care Provider: Rosemarie Ax, MD Consultants: None Code Status: Full  Assessment and Plan: 64 y.o. male presenting with shortness of breath and pulmonary edema. 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  # Pulmonary Edema/acute diastolic HF: hypoxic in ED with 70-80s on O2 sat, placed on bipap in the ED. Echo in November 2015 showed EF 60-65% and grade 2 diastolic dysfunction. - BNP 246.9 - Troponins negative - CXR bilateral pulmonary edema - Currently on 2L which is his baseline - Wt 238->233 - continue lasix 40iv bid  # Diabetes: A1c 7.6  - CBG 334, 289, 225, 30 units correction yesterday - increase Lantus to 30U BID - Moderate SSI - Monitor CBG after change in Lantus  # Anemia: baseline >1 year ago appears to be around 9 in early 2015 but does have hgb 10-12 end of 2014. Hemoglobin 7.8 at admission - B12, Ferritin, and Folate all WNL - Continue to monitor  # Hypertension: hold coreg, continue amlodipine - BP 125-161/56-73 - Hydralazine PRN - Continue to monitor  FEN/GI: diet carb mod/heart healthy, saline lock Prophylaxis: heparin sq  Disposition: home pending further diuresis  Subjective:  No acute complaints overnight. Feeling well and breathing comfortable  Objective: Temp:  [98 F (36.7 C)-98.6 F (37 C)] 98.6 F (37 C) (01/10 0423) Pulse Rate:  [67-83] 67 (01/10 0423) Resp:  [18] 18 (01/10 0423) BP: (124-161)/(47-73) 124/47 mmHg (01/10 0423) SpO2:  [90 %-97 %] 93 % (01/10 0423) Weight:  [233 lb 11 oz (106 kg)] 233 lb 11 oz (106 kg) (01/10 0423) Physical Exam: General: 63yo male lying flat in bed in no distress Cardiovascular: S1 and S2 noted. No  murmurs/rubs/gallops.  Respiratory: CTAB. No increased work of breathing. Conversing in full sentences Abdomen: Bowel sounds noted. Soft and nondistended. No tenderness to palpation. Extremities: 1+ edema in bilateral lower extremities  Laboratory:  Recent Labs Lab 02/10/14 1045 02/11/14 0754  WBC 6.4 7.6  HGB 7.8* 7.7*  HCT 25.0* 24.2*  PLT 137* 135*    Recent Labs Lab 02/10/14 1045 02/11/14 0754  NA 140 140  K 4.2 3.9  CL 109 107  CO2 23 20  BUN 23 32*  CREATININE 1.30 1.24  CALCIUM 8.7 8.1*  PROT 6.5  --   BILITOT 0.5  --   ALKPHOS 73  --   ALT 30  --   AST 26  --   GLUCOSE 324* 284*   - BNP 246.9 - Troponins negative  Imaging/Diagnostic Tests: No results found.   Frazier Richards, MD 02/12/2014, 8:54 AM PGY-2, [place] Intern pager: [phone], text pages welcome

## 2014-02-12 NOTE — Progress Notes (Signed)
Patient ambulated 350 in the hallway on 2 L O2. Patient O2 saturation stayed between 88 - 98%. Patient tolerated well. Will continue to monitor.

## 2014-02-12 NOTE — Discharge Instructions (Signed)

## 2014-02-12 NOTE — Progress Notes (Signed)
Patient stated he did not want to wear CPAP.  Patient aware if he changes his mind to ask RN to call RT.

## 2014-02-13 DIAGNOSIS — M7989 Other specified soft tissue disorders: Secondary | ICD-10-CM | POA: Insufficient documentation

## 2014-02-13 LAB — CBC
HCT: 28 % — ABNORMAL LOW (ref 39.0–52.0)
Hemoglobin: 8.8 g/dL — ABNORMAL LOW (ref 13.0–17.0)
MCH: 25.7 pg — ABNORMAL LOW (ref 26.0–34.0)
MCHC: 31.4 g/dL (ref 30.0–36.0)
MCV: 81.9 fL (ref 78.0–100.0)
PLATELETS: 147 10*3/uL — AB (ref 150–400)
RBC: 3.42 MIL/uL — AB (ref 4.22–5.81)
RDW: 16 % — ABNORMAL HIGH (ref 11.5–15.5)
WBC: 5 10*3/uL (ref 4.0–10.5)

## 2014-02-13 LAB — FOLATE RBC
FOLATE, RBC: 1115 ng/mL (ref >498)
Folate, Hemolysate: 279.9 ng/mL
HEMATOCRIT: 25.1 % — AB (ref 37.5–51.0)

## 2014-02-13 LAB — BASIC METABOLIC PANEL
Anion gap: 6 (ref 5–15)
BUN: 23 mg/dL (ref 6–23)
CO2: 31 mmol/L (ref 19–32)
Calcium: 8.6 mg/dL (ref 8.4–10.5)
Chloride: 105 mEq/L (ref 96–112)
Creatinine, Ser: 1.02 mg/dL (ref 0.50–1.35)
GFR calc non Af Amer: 77 mL/min — ABNORMAL LOW (ref >90)
GFR, EST AFRICAN AMERICAN: 89 mL/min — AB (ref >90)
Glucose, Bld: 209 mg/dL — ABNORMAL HIGH (ref 70–99)
Potassium: 3.3 mmol/L — ABNORMAL LOW (ref 3.5–5.1)
SODIUM: 142 mmol/L (ref 135–145)

## 2014-02-13 LAB — GLUCOSE, CAPILLARY
GLUCOSE-CAPILLARY: 185 mg/dL — AB (ref 70–99)
Glucose-Capillary: 165 mg/dL — ABNORMAL HIGH (ref 70–99)
Glucose-Capillary: 212 mg/dL — ABNORMAL HIGH (ref 70–99)
Glucose-Capillary: 217 mg/dL — ABNORMAL HIGH (ref 70–99)

## 2014-02-13 MED ORDER — FUROSEMIDE 40 MG PO TABS: 60.0000 mg | ORAL_TABLET | Freq: Every day | ORAL | Status: DC

## 2014-02-13 MED ORDER — POTASSIUM CHLORIDE CRYS ER 20 MEQ PO TBCR
40.0000 meq | EXTENDED_RELEASE_TABLET | Freq: Once | ORAL | Status: AC
  Administered 2014-02-13: 40 meq via ORAL
  Filled 2014-02-13: qty 2

## 2014-02-13 MED ORDER — FUROSEMIDE 40 MG PO TABS
60.0000 mg | ORAL_TABLET | Freq: Every day | ORAL | Status: DC
  Administered 2014-02-13: 60 mg via ORAL
  Filled 2014-02-13: qty 1

## 2014-02-13 NOTE — Progress Notes (Signed)
Family Medicine Teaching Service Daily Progress Note Intern Pager: [phone]  [name] Medical record number: [id number] Date of birth: 23-Apr-1950 Age: 64 y.o. Gender: male  Primary Care Provider: Rosemarie Ax, MD Consultants: None Code Status: Full  Assessment and Plan: 64 y.o. male presenting with shortness of breath and pulmonary edema. 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  # Pulmonary Edema/acute Diastolic HF: hypoxic in ED with 70-80s on O2 sat, placed on bipap in the ED. Echo in November 2015 showed EF 60-65% and grade 2 diastolic dysfunction. - BNP 246.9 - Troponins negative - CXR bilateral pulmonary edema - Currently on 2L which is his baseline - Wt 238->226 - Lasix 40iv bid. Will transition to oral diuretic today. Home dose prior to admission 40mg  daily. Will increase dose to 60mg  daily at discharge.  # Hypokalemia - Potassium 3.3 today.  - Repleted with 57mEq Kdur  # Diabetes: A1c 7.6  - CBG [phone] - Lantus to 30U BID - Moderate SSI - Monitor CBG after change in Lantus  # Anemia: baseline >1 year ago appears to be around 9 in early 2015 but does have hgb 10-12 end of 2014. Hemoglobin 7.8 at admission - Stable at 7.7 today - B12, Ferritin, and Folate all WNL - Continue to monitor  # Hypertension: hold coreg d/t acute exacerbation of CHF - Continue amlodipine 10mg  - BP 159-178/70-75 - Hydralazine PRN - Continue to monitor  FEN/GI: diet carb mod/heart healthy, saline lock Prophylaxis: heparin sq  Disposition: Discharge home today.  Subjective:  No acute complaints overnight. States he is doing well. Denies shortness of breath, even with exertion. States he feels ready to go home. No further complaints today.  Objective: Temp:  [98 F (36.7 C)-98.1 F (36.7 C)] 98 F (36.7 C) (01/11 0417) Pulse Rate:  [73-76] 73 (01/11 0417) Resp:   [18-19] 19 (01/11 0417) BP: (159-178)/(70-75) 159/75 mmHg (01/11 0417) SpO2:  [98 %-100 %] 100 % (01/11 0417) Weight:  [226 lb 3.2 oz (102.604 kg)] 226 lb 3.2 oz (102.604 kg) (01/11 0417) Physical Exam: General: 63yo male lying flat in bed in no distress Cardiovascular: S1 and S2 noted. No murmurs/rubs/gallops.  Respiratory: CTAB. No increased work of breathing. Conversing in full sentences Abdomen: Bowel sounds noted. Soft and nondistended. No tenderness to palpation. Extremities: No edema noted.  Laboratory:  Recent Labs Lab 02/10/14 1045 02/11/14 0754 02/13/14 0348  WBC 6.4 7.6 5.0  HGB 7.8* 7.7* 8.8*  HCT 25.0* 24.2* 28.0*  PLT 137* 135* 147*    Recent Labs Lab 02/10/14 1045 02/11/14 0754 02/13/14 0348  NA 140 140 142  K 4.2 3.9 3.3*  CL 109 107 105  CO2 23 20 31   BUN 23 32* 23  CREATININE 1.30 1.24 1.02  CALCIUM 8.7 8.1* 8.6  PROT 6.5  --   --   BILITOT 0.5  --   --   ALKPHOS 73  --   --   ALT 30  --   --   AST 26  --   --   GLUCOSE 324* 284* 209*   - BNP 246.9 - Troponins negative  Imaging/Diagnostic Tests: No results found.   [place], [place] 02/13/2014, 8:26 AM PGY-1, [place] Intern pager: [phone], text pages welcome

## 2014-02-13 NOTE — Progress Notes (Signed)
PT AMBULATING IN HALLWAY WITH 2 L O2 [place]. NO ACUTE DISTRESS. OBSERVED. NO ASSISTANCE. APPROX 100 FEET.

## 2014-02-13 NOTE — Care Management Note (Signed)
    Page 1 of 2   02/13/2014     3:00:37 PM CARE MANAGEMENT NOTE 02/13/2014  Patient:  Leonard [name], Leonard [name]   Account Number:  [id number]  Date Initiated:  02/13/2014  Documentation initiated by:  Marvetta Gibbons  Subjective/Objective Assessment:   Pt admitted with pul. edema     Action/Plan:   PTA pt lived at home-   Anticipated DC Date:  02/13/2014   Anticipated DC Plan:  [place]  CM consult      [place]   Choice offered to / List presented to:  C-5 Sibling        HH arranged  HH-1 RN  [place].   Status of service:  Completed, signed off Medicare Important Message given?   (If response is "NO", the following Medicare IM given date fields will be blank) Date Medicare IM given:   Medicare IM given by:   Date Additional Medicare IM given:   Additional Medicare IM given by:    Discharge Disposition:  [place]  Per UR Regulation:  Reviewed for med. necessity/level of care/duration of stay  If discussed at [place] of Stay Meetings, dates discussed:    Comments:  02/13/14- 55- Marvetta Gibbons RN, BSN [phone] Referral for [facility] needs- in to speak with pt and sister at bedside- per conversation pt has Home 02 with [facility] and has had [facility] services with them also in past- would like to use them again for [facility]- referral called to [place] with [facility]- for [place] for med. management, and disease management- per sister pt has applied for [facility]- but she states that he has be denied. Call made to [facility] with [facility]- pt is active with [facility]- and his caseworker is Mariann Laster- per [facility] has been working with pt on the [facility]- they will continue to follow pt in the community and will f/u on the [facility] application.

## 2014-02-14 ENCOUNTER — Telehealth: Payer: Self-pay | Admitting: *Deleted

## 2014-02-14 NOTE — Telephone Encounter (Signed)
Pamala Hurry, RN with [facility] called requesting verbal orders for vital signs, teaching for diabetes and CHF, medication management and home health aid.  Verbal orders given by Dr. Wendi Snipes, covering provider for Dr. Raeford Razor.  Call with questions [phone].  Derl Barrow, RN

## 2014-02-14 NOTE — Discharge Summary (Signed)
[facility] Discharge Summary  [name] Medical record number: [id number] Date of birth: [birth year] Age: 65 y.o. Gender: male Date of Admission: 02/10/2014  Date of Discharge:  Admitting Physician: Lind Covert, MD  Primary Care Provider: Rosemarie Ax, MD Consultants: None  Indication for Hospitalization: CHF Exacerbation  Discharge Diagnoses/Problem List:  [facility] Pulmonary Edema Diabetes Anemia HTN  Disposition: Discharge Home  Discharge Condition: Stable  Brief Hospital Course:  Mr. Frimpong presented to the ED on 02/10/13 with shortness of breath and hypoxia. BNP elevated to 246.9. Troponins negative. CXR showed bilateral pulmonary edema. EKG with no significant change. IV lasix given throughout hospitalization and transitioned to oral diuretic on 1/11. Initially required bipap, but was transitioned to nasal cannula and weaned to home O2 level of 2L [place] during hospitalization. Coreg was held during hospitalization.  Glucose noted to be elevated to 324 at admission. Lantus was continued at half of his home dose and sliding scale insulin was initiated. Lantus was gradually titrated up to 30Units BID to effectively treat elevated blood sugars. A1C 7.6.  Hemoglobin noted to be 7.8 at admission but was stable throughout hospitalization. B12 normal at 249. Ferritin elevated at 443. Folate within normal limits.  Mr. Sliger was discharged on 1/11 following improvement in respiratory status.  Issues for Follow Up:  - Expressed problems with medications and blood sugar checks at home. Home health ordered at discharge. Follow up need for further home health requirements. - Lasix dose increased from 67m to 645mat discharge. Follow up fluid status and adjust dose as indicated. - CBC to follow up anemia - Colonoscopy  Significant Procedures: None  Significant Labs and Imaging:   Recent Labs Lab 02/10/14 1045 02/10/14 2136  02/11/14 0754 02/13/14 0348  WBC 6.4  --  7.6 5.0  HGB 7.8*  --  7.7* 8.8*  HCT 25.0* 25.1* 24.2* 28.0*  PLT 137*  --  135* 147*    Recent Labs Lab 02/10/14 1045 02/11/14 0754 02/13/14 0348  NA 140 140 142  K 4.2 3.9 3.3*  CL 109 107 105  CO2 _0 GLUCOSE 324* 284* 209*  BUN 23 32* 23  CREATININE 1.30 1.24 1.02  CALCIUM 8.7 8.1* 8.6  ALKPHOS 73  --   --   AST 26  --   --   ALT 30  --   --   ALBUMIN 3.6  --   --   - Troponins negative - BNP 246.9 - Ferritin 443 - B12 249 - A1C 7.6  Dg Chest Port 1 View  02/10/2014   CLINICAL DATA:  Shortness of breath.  EXAM: PORTABLE CHEST - 1 VIEW  COMPARISON:  December 08, 2013.  FINDINGS: Stable cardiomediastinal silhouette. Status post coronary artery bypass graft. Bilateral perihilar and basilar opacities are noted most consistent with pulmonary edema. No pneumothorax or pleural effusion is noted. Bony thorax is intact.  IMPRESSION: Findings are consistent with bilateral pulmonary edema.   Electronically Signed   By: JaSabino Dick.D.   On: 02/10/2014 10:46   Results/Tests Pending at Time of Discharge: None  Discharge Medications:    Medication List    TAKE these medications        ACCU-CHEK AVIVA device  Use as instructed     ACCU-CHEK FASTCLIX LANCETS Misc  40 Units by Does not apply route 2 (two) times daily.     accu-chek softclix lancets  Use as instructed to test 4 times  a day.  Dx code 250.00.  May change to fit patients meter     Adult Blood Pressure Cuff Lg Kit  Please measure your blood pressure the same time everyday, at least one time a week.     amLODipine 10 MG tablet  Commonly known as:  NORVASC  Take 1 tablet (10 mg total) by mouth daily.     aspirin 81 MG tablet  Take 1 tablet (81 mg total) by mouth daily.     atorvastatin 40 MG tablet  Commonly known as:  LIPITOR  TAKE 1 TABLET BY MOUTH EVERY DAY AT 6 PM     B-D ULTRAFINE III SHORT PEN 31G X 8 MM Misc  Generic drug:  Insulin Pen Needle  USE  AS DIRECTED     carvedilol 25 MG tablet  Commonly known as:  COREG  Take 2 tablets (50 mg total) by mouth 2 (two) times daily with a meal.     cloNIDine 0.3 MG tablet  Commonly known as:  CATAPRES  Take 0.3 mg by mouth 2 (two) times daily.     furosemide 40 MG tablet  Commonly known as:  LASIX  Take 1.5 tablets (60 mg total) by mouth daily.     glipiZIDE 10 MG tablet  Commonly known as:  GLUCOTROL  Take 1 tablet (10 mg total) by mouth 2 (two) times daily before a meal.     glucose blood test strip  Commonly known as:  ACCU-CHEK AVIVA  Use as instructed. QS 3 times a day testing.     hydrochlorothiazide 25 MG tablet  Commonly known as:  HYDRODIURIL  Take 25 mg by mouth daily.     Insulin Glargine 100 UNIT/ML Solostar Pen  Commonly known as:  LANTUS SOLOSTAR  INJECT 40 UNITS UNDER THE SKIN TWICE DAILY     lisinopril 40 MG tablet  Commonly known as:  PRINIVIL,ZESTRIL  Take 1 tablet (40 mg total) by mouth daily.     metFORMIN 1000 MG tablet  Commonly known as:  GLUCOPHAGE  Take 1 tablet (1,000 mg total) by mouth 2 (two) times daily with a meal.     silver sulfADIAZINE 1 % cream  Commonly known as:  SILVADENE  Apply 1 application topically daily.     spironolactone 50 MG tablet  Commonly known as:  ALDACTONE  Take 1 tablet (50 mg total) by mouth daily.        Discharge Instructions: Please refer to Patient Instructions section of EMR for full details.  Patient was counseled important signs and symptoms that should prompt return to medical care, changes in medications, dietary instructions, activity restrictions, and follow up appointments.   Follow-Up Appointments:     Follow-up Information    Follow up with Thersa Salt, DO On 02/16/2014.   Specialty:  Family Medicine   Why:  An appointment has been scheduled for you at 10:30am on the 14th   Contact information:   [place] [address] [phone]       Follow up with [place].   Why:  [place]- RN arranged----    Contact information:   [address] [place] [address] [phone]       Please follow up.   Why:  pt is also followed in community by [facility] [place], [place] 02/14/2014, 8:18 PM PGY-1, [place]

## 2014-02-16 ENCOUNTER — Encounter: Payer: Self-pay | Admitting: Family Medicine

## 2014-02-16 ENCOUNTER — Inpatient Hospital Stay: Payer: Medicaid Other | Admitting: Family Medicine

## 2014-02-16 ENCOUNTER — Ambulatory Visit (INDEPENDENT_AMBULATORY_CARE_PROVIDER_SITE_OTHER): Payer: Medicaid Other | Admitting: Family Medicine

## 2014-02-16 VITALS — BP 176/76 | HR 78 | Temp 98.3°F | Ht 68.0 in | Wt 225.8 lb

## 2014-02-16 DIAGNOSIS — J449 Chronic obstructive pulmonary disease, unspecified: Secondary | ICD-10-CM | POA: Insufficient documentation

## 2014-02-16 DIAGNOSIS — I10 Essential (primary) hypertension: Secondary | ICD-10-CM

## 2014-02-16 DIAGNOSIS — L989 Disorder of the skin and subcutaneous tissue, unspecified: Secondary | ICD-10-CM

## 2014-02-16 DIAGNOSIS — I5032 Chronic diastolic (congestive) heart failure: Secondary | ICD-10-CM

## 2014-02-16 HISTORY — DX: Chronic obstructive pulmonary disease, unspecified: J44.9

## 2014-02-16 LAB — BASIC METABOLIC PANEL
BUN: 32 mg/dL — AB (ref 6–23)
CHLORIDE: 103 meq/L (ref 96–112)
CO2: 26 meq/L (ref 19–32)
CREATININE: 1.18 mg/dL (ref 0.50–1.35)
Calcium: 9.1 mg/dL (ref 8.4–10.5)
Glucose, Bld: 121 mg/dL — ABNORMAL HIGH (ref 70–99)
Potassium: 3.7 mEq/L (ref 3.5–5.3)
SODIUM: 138 meq/L (ref 135–145)

## 2014-02-16 NOTE — Patient Instructions (Signed)
It was nice to see you today.  Please continue to take your medications as prescribed.  I am referring you to heart failure clinic.  We will be in contact regarding your appointment and potential assistance.  Take care  Dr. Lacinda Axon

## 2014-02-16 NOTE — Progress Notes (Signed)
   Subjective:    Patient ID: Leonard [name], male    DOB: 04-29-1950, 64 y.o.   MRN: [id number]  HPI 64 year old male with a complicated past medical history including CAD status post CABG, DM 2, diastolic heart failure, and chronic respiratory failure presents for hospital follow-up.  1) Hospital follow up  Patient was admitted from 1/8 - 1/11.  He presented initially with shortness of breath and hypoxia.  His symptoms and presentation were thought to be consistent with diastolic heart failure exacerbation. He was treated aggressively with diuresis.  Weight on discharge was 226 pounds.  Patient presents for follow-up today.  He reports that he is feeling well at this time.  His shortness of breath is at baseline.  No recent chest pain, cough, fevers, chills.  He endorses compliance with his home medications including Lasix 60 mg daily.  No recent weight gain or increasing lower extremity edema.  Review of Systems Per HPI    Objective:   Physical Exam Filed Vitals:   02/16/14 1220  BP: 176/76  Pulse:   Temp:    Exam: General: Chronically ill-appearing obese male in no acute distress. Nasal cannula oxygen in place. Cardiovascular: RRR. No murmurs, rubs, or gallops. Respiratory: CTAB. No rales noted. Abdomen: soft, nontender, nondistended. Extremities: 1+ LE edema.  Skin: dark raised lesion noted on patient's nose. Regular border. Symmetrical. Color variation noted. Patient endorsed color change.    Assessment & Plan:  See Problem List

## 2014-02-16 NOTE — Assessment & Plan Note (Signed)
Currently stable at this time.  Patient appears euvolemic. Advise continuation of current medical therapy. Given frequent exacerbations and admissions to the hospital as well as his cardiac history, will refer to heart failure clinic for further assessment/evaluation in hopes of keeping Hospital admissions down.  *Patient's sister requesting additional home health services/personal care services.  I discussed this with social worker Hunt Oris who will look into this. Will also inform PCP to see if further services can be arranged.

## 2014-02-16 NOTE — Assessment & Plan Note (Signed)
Skin lesion on nose concerning for underlying malignancy. Patient needs biopsy and potential excision. Will defer to PCP to see if he would like to perform biopsy or if he would prefer referral to dermatology.

## 2014-02-21 ENCOUNTER — Telehealth: Payer: Self-pay | Admitting: *Deleted

## 2014-02-21 NOTE — Telephone Encounter (Signed)
Appointment set up at

## 2014-02-21 NOTE — Telephone Encounter (Signed)
Attempted to call patient to inform him of cardiology appointment set up for Friday 03/17/2014 @ 11am with Dr. Mare Ferrari. [facility] [id number] [employer] phone # [phone]. patient to call if needing to reschedule or cancel.

## 2014-02-22 ENCOUNTER — Other Ambulatory Visit: Payer: Self-pay | Admitting: Family Medicine

## 2014-02-22 NOTE — Telephone Encounter (Signed)
Spoke with patient and informed him of his appointemnt

## 2014-02-28 ENCOUNTER — Ambulatory Visit (INDEPENDENT_AMBULATORY_CARE_PROVIDER_SITE_OTHER): Payer: Medicaid Other

## 2014-02-28 ENCOUNTER — Ambulatory Visit: Payer: Medicaid Other

## 2014-02-28 VITALS — BP 165/90 | HR 66 | Resp 12

## 2014-02-28 DIAGNOSIS — E1140 Type 2 diabetes mellitus with diabetic neuropathy, unspecified: Secondary | ICD-10-CM

## 2014-02-28 DIAGNOSIS — M79673 Pain in unspecified foot: Secondary | ICD-10-CM

## 2014-02-28 DIAGNOSIS — M79676 Pain in unspecified toe(s): Secondary | ICD-10-CM

## 2014-02-28 DIAGNOSIS — E1121 Type 2 diabetes mellitus with diabetic nephropathy: Secondary | ICD-10-CM

## 2014-02-28 DIAGNOSIS — B351 Tinea unguium: Secondary | ICD-10-CM

## 2014-02-28 DIAGNOSIS — I739 Peripheral vascular disease, unspecified: Secondary | ICD-10-CM

## 2014-02-28 DIAGNOSIS — Q828 Other specified congenital malformations of skin: Secondary | ICD-10-CM

## 2014-02-28 NOTE — Patient Instructions (Signed)
Diabetes and Foot Care Diabetes may cause you to have problems because of poor blood supply (circulation) to your feet and legs. This may cause the skin on your feet to become thinner, break easier, and heal more slowly. Your skin may become dry, and the skin may peel and crack. You may also have nerve damage in your legs and feet causing decreased feeling in them. You may not notice minor injuries to your feet that could lead to infections or more serious problems. Taking care of your feet is one of the most important things you can do for yourself.  HOME CARE INSTRUCTIONS  Wear shoes at all times, even in the house. Do not go barefoot. Bare feet are easily injured.  Check your feet daily for blisters, cuts, and redness. If you cannot see the bottom of your feet, use a mirror or ask someone for help.  Wash your feet with warm water (do not use hot water) and mild soap. Then pat your feet and the areas between your toes until they are completely dry. Do not soak your feet as this can dry your skin.  Apply a moisturizing lotion or petroleum jelly (that does not contain alcohol and is unscented) to the skin on your feet and to dry, brittle toenails. Do not apply lotion between your toes.  Trim your toenails straight across. Do not dig under them or around the cuticle. File the edges of your nails with an emery board or nail file.  Do not cut corns or calluses or try to remove them with medicine.  Wear clean socks or stockings every day. Make sure they are not too tight. Do not wear knee-high stockings since they may decrease blood flow to your legs.  Wear shoes that fit properly and have enough cushioning. To break in new shoes, wear them for just a few hours a day. This prevents you from injuring your feet. Always look in your shoes before you put them on to be sure there are no objects inside.  Do not cross your legs. This may decrease the blood flow to your feet.  If you find a minor scrape,  cut, or break in the skin on your feet, keep it and the skin around it clean and dry. These areas may be cleansed with mild soap and water. Do not cleanse the area with peroxide, alcohol, or iodine.  When you remove an adhesive bandage, be sure not to damage the skin around it.  If you have a wound, look at it several times a day to make sure it is healing.  Do not use heating pads or hot water bottles. They may burn your skin. If you have lost feeling in your feet or legs, you may not know it is happening until it is too late.  Make sure your health care provider performs a complete foot exam at least annually or more often if you have foot problems. Report any cuts, sores, or bruises to your health care provider immediately. SEEK MEDICAL CARE IF:   You have an injury that is not healing.  You have cuts or breaks in the skin.  You have an ingrown nail.  You notice redness on your legs or feet.  You feel burning or tingling in your legs or feet.  You have pain or cramps in your legs and feet.  Your legs or feet are numb.  Your feet always feel cold. SEEK IMMEDIATE MEDICAL CARE IF:   There is increasing redness,   swelling, or pain in or around a wound.  There is a red line that goes up your leg.  Pus is coming from a wound.  You develop a fever or as directed by your health care provider.  You notice a bad smell coming from an ulcer or wound. Document Released: 01/18/2000 Document Revised: 09/22/2012 Document Reviewed: 06/29/2012 ExitCare Patient Information 2015 [employer]. This information is not intended to replace advice given to you by your health care provider. Make sure you discuss any questions you have with your health care provider.  

## 2014-02-28 NOTE — Progress Notes (Signed)
   Subjective:    Patient ID: Leonard [name], male    DOB: [birth year], 64 y.o.   MRN: [id number]  HPI Pt stated lt underneath 2nd toe have hard skin and been there for 2 months. The toe is not getting worse but cannot feel anything. Tried no treatment.  tonails trim.   Review of Systems  HENT: Positive for sneezing.   Neurological: Positive for dizziness.  All other systems reviewed and are negative.      Objective:   Physical Exam 64 year old African recur male well-developed well-nourished oriented 23 presents this time does have history of PVD and diabetes with complications. 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 Decreased sensation confirmed on Semmes Weinstein monofilament to the forefoot digits and arch bilateral. Patient does have thick brittle crumbly dystrophic nails 12 and 3 on the right 1 through 5 on the left. There is keratoses of the second digit digital sulcus area on the left and sub-third MTP area left with a new PT keratotic lesion. Mild keratoses HD 5 left as well. No open wounds no ulcers no secondary infections rigid digital contractures mild HAV deformity and hallux rigidus.       Assessment & Plan:  Assessment this time diabetes history peripheral vascular disease as well as peripheral neuropathy. Patient does have mycotic brittle dystrophic nails debrided 8 the presence of diabetes and complications also debridement multiple keratoses second digit plantar sulcus area and as well as sub-third MTP area and HD 5 left return for future palliative care every 3 months as recommended next  Harriet Masson DPM

## 2014-03-14 ENCOUNTER — Telehealth: Payer: Self-pay | Admitting: Clinical

## 2014-03-14 NOTE — Telephone Encounter (Signed)
CSW received a request from [facility] requesting that pts PCP complete a PCS form. CSW informed Ms. Mayer Masker that a form had been completed in November 2015. Ms. Mayer Masker informed CSW that pt was denied at that time however has since then declined and is needing extra assistance.  CSW will place form in PCP's box.  Hunt Oris, MSW, [place]

## 2014-03-16 ENCOUNTER — Encounter: Payer: Self-pay | Admitting: Cardiology

## 2014-03-16 ENCOUNTER — Ambulatory Visit (INDEPENDENT_AMBULATORY_CARE_PROVIDER_SITE_OTHER): Payer: Medicaid Other | Admitting: Cardiology

## 2014-03-16 ENCOUNTER — Telehealth: Payer: Self-pay | Admitting: *Deleted

## 2014-03-16 VITALS — BP 154/76 | HR 65 | Ht 68.0 in | Wt 226.8 lb

## 2014-03-16 DIAGNOSIS — I10 Essential (primary) hypertension: Secondary | ICD-10-CM

## 2014-03-16 DIAGNOSIS — I5041 Acute combined systolic (congestive) and diastolic (congestive) heart failure: Secondary | ICD-10-CM

## 2014-03-16 DIAGNOSIS — I5032 Chronic diastolic (congestive) heart failure: Secondary | ICD-10-CM

## 2014-03-16 DIAGNOSIS — M7989 Other specified soft tissue disorders: Secondary | ICD-10-CM

## 2014-03-16 DIAGNOSIS — R609 Edema, unspecified: Secondary | ICD-10-CM

## 2014-03-16 DIAGNOSIS — Z951 Presence of aortocoronary bypass graft: Secondary | ICD-10-CM

## 2014-03-16 DIAGNOSIS — I5033 Acute on chronic diastolic (congestive) heart failure: Secondary | ICD-10-CM | POA: Insufficient documentation

## 2014-03-16 LAB — BASIC METABOLIC PANEL
BUN: 47 mg/dL — ABNORMAL HIGH (ref 6–23)
CHLORIDE: 109 meq/L (ref 96–112)
CO2: 25 mEq/L (ref 19–32)
CREATININE: 1.52 mg/dL — AB (ref 0.40–1.50)
Calcium: 9.3 mg/dL (ref 8.4–10.5)
GFR: 59.92 mL/min — ABNORMAL LOW (ref >60.00)
Glucose, Bld: 95 mg/dL (ref 70–99)
Potassium: 4.1 mEq/L (ref 3.5–5.1)
SODIUM: 143 meq/L (ref 135–145)

## 2014-03-16 MED ORDER — FUROSEMIDE 80 MG PO TABS: 80.0000 mg | ORAL_TABLET | Freq: Every day | ORAL | Status: DC

## 2014-03-16 MED ORDER — FUROSEMIDE 40 MG PO TABS: ORAL_TABLET | ORAL | Status: DC

## 2014-03-16 NOTE — Progress Notes (Signed)
Leonard [name] Date of Birth:  08-13-1950 [facility] [address] [place], [place]  [address] [phone]        Fax   [phone]   History of Present Illness: This 64 year old gentleman is seen by me for the first time today.  He is seen at the request of Dr. Lacinda Axon of the [facility] family practice Center.  He is being seen because of his history of ischemic heart disease and congestive heart failure.  Patient has known ischemic heart disease.  He underwent coronary artery bypass graft surgery 3 on 04/07/13 I Dr. Prescott Gum.  Patient also has a history of peripheral arterial occlusive disease.  He had a prior stent to his right femoral artery in December 2014.  He has a history of diabetes and a history of hypertension.  He has had previous admissions for exacerbation of heart failure.  His most recent echocardiogram on 12/09/13 showed normal systolic function with ejection fraction of 60-65% and mild LVH and there was grade 2 diastolic dysfunction.  The patient has mild renal insufficiency.  In January 2016 his creatinine was 1.18 and his BUN was 32. The patient has not been experiencing any recurrent chest pain or angina.  He sleeps on one pillow.  He denies any paroxysmal nocturnal dyspnea.  The patient is on home oxygen at 2 L a minute around the clock.  The patient has a history of chronic anemia.  He was a previous heavy smoker.  He quit smoking about a year ago.  Current Outpatient Prescriptions  Medication Sig Dispense Refill  . ACCU-CHEK FASTCLIX LANCETS MISC 40 Units by Does not apply route 2 (two) times daily. 1 each 5  . amLODipine (NORVASC) 10 MG tablet Take 1 tablet (10 mg total) by mouth daily. 90 tablet 3  . aspirin 81 MG tablet Take 1 tablet (81 mg total) by mouth daily.    Marland Kitchen atorvastatin (LIPITOR) 40 MG tablet TAKE 1 TABLET BY MOUTH EVERY DAY AT 6 PM 90 tablet 1  . B-D ULTRAFINE III SHORT PEN 31G X 8 MM MISC USE AS DIRECTED 100 each 0  . Blood Pressure  Monitoring (ADULT BLOOD PRESSURE CUFF LG) KIT Please measure your blood pressure the same time everyday, at least one time a week. 1 each 0  . carvedilol (COREG) 25 MG tablet TAKE 2 TABLETS BY MOUTH TWICE DAILY WITH MEALS 90 tablet 0  . cloNIDine (CATAPRES) 0.3 MG tablet Take 0.2 mg by mouth 2 (two) times daily.   1  . furosemide (LASIX) 40 MG tablet Take 1.5 tablets (60 mg total) by mouth daily. 60 tablet 0  . glipiZIDE (GLUCOTROL) 10 MG tablet Take 1 tablet (10 mg total) by mouth 2 (two) times daily before a meal. 90 tablet 0  . glucose blood (ACCU-CHEK AVIVA) test strip Use as instructed. QS 3 times a day testing. 100 each 12  . hydrochlorothiazide (HYDRODIURIL) 25 MG tablet Take 12.5 mg by mouth daily.   0  . Insulin Glargine (LANTUS SOLOSTAR) 100 UNIT/ML Solostar Pen INJECT 40 UNITS UNDER THE SKIN TWICE DAILY 15 mL 5  . Lancet Devices (ACCU-CHEK SOFTCLIX) lancets Use as instructed to test 4 times a day.  Dx code 250.00.  May change to fit patients meter 100 each 12  . lisinopril (PRINIVIL,ZESTRIL) 40 MG tablet Take 1 tablet (40 mg total) by mouth daily. 90 tablet 3  . metFORMIN (GLUCOPHAGE) 1000 MG tablet Take 1 tablet (1,000 mg total)  by mouth 2 (two) times daily with a meal. 60 tablet 6  . silver sulfADIAZINE (SILVADENE) 1 % cream Apply 1 application topically daily.    Marland Kitchen spironolactone (ALDACTONE) 50 MG tablet Take 1 tablet (50 mg total) by mouth daily. 90 tablet 3   No current facility-administered medications for this visit.    No Known Allergies  Patient Active Problem List   Diagnosis Date Noted  . COPD (chronic obstructive pulmonary disease) 02/16/2014  . Skin lesion 02/16/2014  . Toe ulcer 01/20/2014  . Diastolic heart failure   . Bilateral lower extremity edema 10/14/2013  . History of tobacco use 08/23/2013  . 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 CAD (coronary artery disease) 04/06/2013  . NSTEMI (non-ST elevated myocardial infarction) 04/05/2013  . PVD - hx of Rt SFA PTA and s/p  Rt 4-5th toe amp Dec 2014 04/05/2013  . Atherosclerosis of native arteries of the extremities with gangrene 02/15/2013  . Normocytic anemia 01/26/2013  . Essential hypertension, benign 11/25/2012  . Diabetes mellitus, type 2 01/06/2012  . Mixed hyperlipidemia 07/17/2008    History  Smoking status  . Former Smoker -- 1.00 packs/day for 46 years  . Types: Cigarettes  . Start date: 02/04/1967  . Quit date: 02/03/2013  Smokeless tobacco  . Never Used    Comment: Doing well with quitting.    History  Alcohol Use No    Family History  Problem Relation Age of Onset  . Cancer Mother     lung cancer  . Hypertension Mother   . Cancer Sister     patient thinks it was uterine cancer  . Hypertension Sister     Review of Systems: Constitutional: no fever chills diaphoresis or fatigue or change in weight.  Head and neck: no hearing loss, no epistaxis, no photophobia or visual disturbance. Respiratory: Roderick dyspnea on home oxygen Cardiovascular: No chest pain peripheral edema, palpitations. Gastrointestinal: No abdominal distention, no abdominal pain, no change in bowel habits hematochezia or melena. Genitourinary: No dysuria, no frequency, no urgency, no nocturia. Musculoskeletal:No arthralgias, no back pain, no gait disturbance or myalgias. Neurological: No dizziness, no headaches, no numbness, no seizures, no syncope, no weakness, no tremors. Hematologic: No lymphadenopathy, no easy bruising. Psychiatric: No confusion, no hallucinations, no sleep disturbance.   Wt Readings from Last 3 Encounters:  03/16/14 226 lb 12.8 oz (102.876 kg)  02/16/14 225 lb 12.8 oz (102.422 kg)  02/13/14 226 lb 3.2 oz (102.604 kg)    Physical Exam: Filed Vitals:   03/16/14 1053  BP: 154/76  Pulse: 65  The patient appears to be in no distress.  Nasal oxygen in place  Head and neck exam reveals that the pupils are equal and reactive.  The extraocular movements are full.  There is no scleral  icterus.  Mouth and pharynx are benign.  No lymphadenopathy.  No carotid bruits.  The jugular venous pressure is normal.  Thyroid is not enlarged or tender.  There is a prominent dark mole on his nose for which he will seek consultation from his PCP or dermatologist  Chest is clear to percussion and auscultation.  No rales or rhonchi.  Expansion of the chest is symmetrical.  Heart reveals no abnormal lift or heave.  First and second heart sounds are normal.  There is no murmur gallop rub or click.  The abdomen is soft and nontender.  Bowel sounds are normoactive.  There is no hepatosplenomegaly or mass.  There are no abdominal bruits.  Extremities  reveal no phlebitis and there is trace edema.  Pedal pulses are present. There is no cyanosis or clubbing.  Neurologic exam is normal strength and no lateralizing weakness.  No sensory deficits.  Integument reveals no rash  EKG today shows normal sinus rhythm and or R wave progression V1 through V3 and no ischemic changes.  The tracing is unchanged since 02/10/14.  Assessment / Plan: 1.  Chronic diastolic heart failure with preserved ejection fraction.  He currently appears to be euvolemic 2.  Ischemic heart disease status post CABG in March 2015.  No recurrent angina. 3.  Renal insufficiency 4.  Diabetes mellitus type 2 5.  Essential hypertension 6.  Anemia of chronic disease 7.  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  Disposition: We will continue him on his current medication.  My initial inclination was to try to increase his Lasix further.  However, we sent off a BMET today which showed that his BUN and creatinine had increased significantly since previous study reflecting the increase in his Lasix from 40 up to 60 mg daily.  He seems to be weak doing well clinically at this dose.  He is not having any acute dyspnea now.  We will continue current dose of furosemide.  Continue to limit dietary  salt. We will plan to recheck him in one month for a follow-up office visit and basal metabolic panel. Many thanks for the opportunity to see this pleasant gentleman with you.

## 2014-03-16 NOTE — Telephone Encounter (Signed)
-----   Message from Darlin Coco, MD sent at 03/16/2014  6:53 PM EST ----- Please report.  The kidneys are dryer.  Therefore we do not want him to increase his Lasix up to 80 mg.  Keep it at 60 mg daily.  His potassium has come up to normal

## 2014-03-16 NOTE — Patient Instructions (Signed)
Will obtain labs today and call you with the results (bmet)  INCREASE YOUR LASIX (FUROSEMIDE) 80 MG DAILY, NEW RX SENT TO PHARMACY   Your physician recommends that you schedule a follow-up appointment in: 1 month ov/bmet

## 2014-03-16 NOTE — Telephone Encounter (Signed)
Advised patient, verbalized understanding  

## 2014-03-17 ENCOUNTER — Ambulatory Visit: Payer: Medicaid Other | Admitting: Cardiology

## 2014-03-20 ENCOUNTER — Other Ambulatory Visit: Payer: Self-pay | Admitting: Family Medicine

## 2014-03-28 ENCOUNTER — Encounter: Payer: Self-pay | Admitting: Clinical

## 2014-03-28 NOTE — Progress Notes (Signed)
CSW has faxed PCS form to [employer]. CSW will notify the [facility] social worker.  Hunt Oris, MSW, [place]

## 2014-04-05 ENCOUNTER — Other Ambulatory Visit: Payer: Self-pay | Admitting: Family Medicine

## 2014-04-12 ENCOUNTER — Ambulatory Visit (INDEPENDENT_AMBULATORY_CARE_PROVIDER_SITE_OTHER): Payer: Medicaid Other | Admitting: Cardiology

## 2014-04-12 ENCOUNTER — Encounter: Payer: Self-pay | Admitting: Cardiology

## 2014-04-12 VITALS — BP 158/86 | HR 71 | Ht 68.0 in | Wt 229.6 lb

## 2014-04-12 DIAGNOSIS — I10 Essential (primary) hypertension: Secondary | ICD-10-CM

## 2014-04-12 DIAGNOSIS — I119 Hypertensive heart disease without heart failure: Secondary | ICD-10-CM

## 2014-04-12 DIAGNOSIS — E1140 Type 2 diabetes mellitus with diabetic neuropathy, unspecified: Secondary | ICD-10-CM

## 2014-04-12 LAB — BASIC METABOLIC PANEL
BUN: 43 mg/dL — AB (ref 6–23)
CALCIUM: 9.4 mg/dL (ref 8.4–10.5)
CHLORIDE: 104 meq/L (ref 96–112)
CO2: 27 mEq/L (ref 19–32)
Creatinine, Ser: 1.51 mg/dL — ABNORMAL HIGH (ref 0.40–1.50)
GFR: 60.36 mL/min (ref >60.00)
Glucose, Bld: 108 mg/dL — ABNORMAL HIGH (ref 70–99)
Potassium: 4.1 mEq/L (ref 3.5–5.1)
Sodium: 140 mEq/L (ref 135–145)

## 2014-04-12 MED ORDER — LISINOPRIL 40 MG PO TABS: 40.0000 mg | ORAL_TABLET | Freq: Every day | ORAL | Status: DC

## 2014-04-12 MED ORDER — GLIPIZIDE 10 MG PO TABS: 10.0000 mg | ORAL_TABLET | Freq: Two times a day (BID) | ORAL | Status: DC

## 2014-04-12 MED ORDER — CLONIDINE HCL 0.2 MG PO TABS: 0.2000 mg | ORAL_TABLET | Freq: Two times a day (BID) | ORAL | Status: DC

## 2014-04-12 MED ORDER — HYDROCHLOROTHIAZIDE 25 MG PO TABS: 12.5000 mg | ORAL_TABLET | Freq: Every day | ORAL | Status: DC

## 2014-04-12 MED ORDER — FUROSEMIDE 40 MG PO TABS: ORAL_TABLET | ORAL | Status: DC

## 2014-04-12 NOTE — Patient Instructions (Signed)
Will obtain labs today and call you with the results (BMET)  Your physician recommends that you continue on your current medications as directed. Please refer to the Current Medication list given to you today.  Your physician recommends that you schedule a follow-up appointment in: 3 MONTH OV/BMET

## 2014-04-12 NOTE — Progress Notes (Signed)
Quick Note:  Please report to patient. The recent labs are stable. Continue same medication and careful diet. ______ 

## 2014-04-12 NOTE — Progress Notes (Signed)
Cardiology Office Note   Date:  04/12/2014   ID:  Leonard [name], DOB January 08, 1951, MRN [id number]  PCP:  Rosemarie Ax, MD  Cardiologist:   Darlin Coco, MD   No chief complaint on file.     History of Present Illness: Leonard [name] is a 64 y.o. male who presents for follow-up office visit. He is a medical patient of Dr. Lacinda Axon of the [facility] family practice Center. He is being seen because of his history of ischemic heart disease and congestive heart failure. Patient has known ischemic heart disease. He underwent coronary artery bypass graft surgery 3 on 04/07/13 I Dr. Prescott Gum. Patient also has a history of peripheral arterial occlusive disease. He had a prior stent to his right femoral artery in December 2014. He has a history of diabetes and a history of hypertension. He has had previous admissions for exacerbation of heart failure. His most recent echocardiogram on 12/09/13 showed normal systolic function with ejection fraction of 60-65% and mild LVH and there was grade 2 diastolic dysfunction. The patient has mild renal insufficiency. In January 2016 his creatinine was 1.18 and his BUN was 32. The patient has not been experiencing any recurrent chest pain or angina. He sleeps on one pillow. He denies any paroxysmal nocturnal dyspnea. The patient is on home oxygen at 2 L a minute around the clock. The patient has a history of chronic anemia. He was a previous heavy smoker. He quit smoking about a year ago.  Since last visit he has not had any new cardiac symptoms.  He has remained on Lasix 60 mg daily.  We are checking renal function again today.  He has only trace edema.  He is eating well and his weight is up 3 pounds since last visit.   Past Medical History  Diagnosis Date  . Hypertension   . PAD (peripheral artery disease)   . Gangrene of toe     right 5th/notes 01/04/2013   . High cholesterol   . Type II diabetes mellitus     Past Surgical History    Procedure Laterality Date  . Throat surgery  1983    polyps  . Angioplasty / stenting femoral Right 01/13/2013    superficial femoral artery x 2 (3 mm x 60 mm, 4 mm x 60 mm)  . Tonsillectomy and adenoidectomy  ~ 1965  . Amputation Right 01/28/2013    Procedure: RAY AMPUTATION RIGHT  4th & 5th TOE;  Surgeon: Rosetta Posner, MD;  Location: [facility] OR;  Service: Vascular;  Laterality: Right;  . Coronary artery bypass graft N/A 04/07/2013    Procedure: CORONARY ARTERY BYPASS GRAFTING (CABG);  Surgeon: Ivin Poot, MD;  Location: [place];  Service: Open Heart Surgery;  Laterality: N/A;  . Intraoperative transesophageal echocardiogram N/A 04/07/2013    Procedure: INTRAOPERATIVE TRANSESOPHAGEAL ECHOCARDIOGRAM;  Surgeon: Ivin Poot, MD;  Location: [place];  Service: Open Heart Surgery;  Laterality: N/A;  . Lower extremity angiogram Bilateral 01/06/2013    Procedure: LOWER EXTREMITY ANGIOGRAM;  Surgeon: Conrad [place] Antonio, MD;  Location: [facility] CATH LAB;  Service: Cardiovascular;  Laterality: Bilateral;  . Lower extremity angiogram Right 01/13/2013    Procedure: LOWER EXTREMITY ANGIOGRAM;  Surgeon: Conrad , MD;  Location: [facility] CATH LAB;  Service: Cardiovascular;  Laterality: Right;  . Left heart catheterization with coronary angiogram N/A 04/05/2013    Procedure: LEFT HEART CATHETERIZATION WITH CORONARY ANGIOGRAM;  Surgeon: Peter M Martinique, MD;  Location: [facility]  CATH LAB;  Service: Cardiovascular;  Laterality: N/A;     Current Outpatient Prescriptions  Medication Sig Dispense Refill  . ACCU-CHEK FASTCLIX LANCETS MISC 40 Units by Does not apply route 2 (two) times daily. 1 each 5  . amLODipine (NORVASC) 10 MG tablet Take 1 tablet (10 mg total) by mouth daily. 90 tablet 3  . aspirin 81 MG tablet Take 1 tablet (81 mg total) by mouth daily.    Marland Kitchen atorvastatin (LIPITOR) 40 MG tablet TAKE 1 TABLET BY MOUTH EVERY DAY AT 6 PM 90 tablet 1  . B-D ULTRAFINE III SHORT PEN 31G X 8 MM MISC USE AS DIRECTED 100 each 0  . Blood  Pressure Monitoring (ADULT BLOOD PRESSURE CUFF LG) KIT Please measure your blood pressure the same time everyday, at least one time a week. 1 each 0  . carvedilol (COREG) 25 MG tablet TAKE 2 TABLETS BY MOUTH TWICE DAILY WITH MEALS 90 tablet 0  . cloNIDine (CATAPRES) 0.2 MG tablet Take 1 tablet (0.2 mg total) by mouth 2 (two) times daily. 60 tablet 5  . furosemide (LASIX) 40 MG tablet 1 and 1/2 tablet daily 45 tablet 5  . glipiZIDE (GLUCOTROL) 10 MG tablet Take 1 tablet (10 mg total) by mouth 2 (two) times daily before a meal. 60 tablet 1  . glucose blood (ACCU-CHEK AVIVA) test strip Use as instructed. QS 3 times a day testing. 100 each 12  . hydrochlorothiazide (HYDRODIURIL) 25 MG tablet Take 0.5 tablets (12.5 mg total) by mouth daily. 30 tablet 5  . Insulin Glargine (LANTUS SOLOSTAR) 100 UNIT/ML Solostar Pen INJECT 40 UNITS UNDER THE SKIN TWICE DAILY 15 mL 5  . Lancet Devices (ACCU-CHEK SOFTCLIX) lancets Use as instructed to test 4 times a day.  Dx code 250.00.  May change to fit patients meter 100 each 12  . lisinopril (PRINIVIL,ZESTRIL) 40 MG tablet Take 1 tablet (40 mg total) by mouth daily. 30 tablet 5  . metFORMIN (GLUCOPHAGE) 1000 MG tablet Take 1 tablet (1,000 mg total) by mouth 2 (two) times daily with a meal. 60 tablet 6  . spironolactone (ALDACTONE) 50 MG tablet Take 1 tablet (50 mg total) by mouth daily. 90 tablet 3   No current facility-administered medications for this visit.    Allergies:   Review of patient's allergies indicates no known allergies.    Social History:  The patient  reports that he quit smoking about 14 months ago. His smoking use included Cigarettes. He started smoking about 47 years ago. He has a 46 pack-year smoking history. He has never used smokeless tobacco. He reports that he does not drink alcohol or use illicit drugs.   Family History:  The patient's family history includes Cancer in his mother and sister; Hypertension in his mother and sister.    ROS:   Please see the history of present illness.   Otherwise, review of systems are positive for none.   All other systems are reviewed and negative.    PHYSICAL EXAM: VS:  BP 158/86 mmHg  Pulse 71  Ht _0  (1.727 m)  Wt 229 lb 9.6 oz (104.146 kg)  BMI 34.92 kg/m2 , BMI Body mass index is 34.92 kg/(m^2). GEN: Well nourished, well developed, in no acute distress HEENT: normal Neck: no JVD, carotid bruits, or masses Cardiac: RRR; no murmurs, rubs, or gallops, trace bilateral edema  Respiratory:  clear to auscultation bilaterally, normal work of breathing GI: soft, nontender, nondistended, + BS MS: no deformity or atrophy Skin:  warm and dry, no rash Neuro:  Strength and sensation are intact Psych: euthymic mood, full affect   EKG:  EKG is ordered today. The ekg ordered today demonstrates normal sinus rhythm with nonspecific T-wave abnormalities and is unchanged from 03/16/14   Recent Labs: 08/11/2013: TSH 5.292* 12/08/2013: Pro B Natriuretic peptide (BNP) 2795.0* 12/09/2013: Magnesium 1.7 02/10/2014: ALT 30; B Natriuretic Peptide 246.9* 02/13/2014: Hemoglobin 8.8*; Platelets 147* 03/16/2014: BUN 47*; Creatinine 1.52*; Potassium 4.1; Sodium 143    Lipid Panel    Component Value Date/Time   CHOL 147 11/25/2012 1546   TRIG 400* 11/25/2012 1546   HDL 27* 11/25/2012 1546   CHOLHDL 5.4 11/25/2012 1546   VLDL 80* 11/25/2012 1546   LDLCALC 40 11/25/2012 1546      Wt Readings from Last 3 Encounters:  04/12/14 229 lb 9.6 oz (104.146 kg)  03/16/14 226 lb 12.8 oz (102.876 kg)  02/16/14 225 lb 12.8 oz (102.422 kg)         ASSESSMENT AND PLAN:  1. Chronic diastolic heart failure with preserved ejection fraction. He currently appears to be euvolemic 2. Ischemic heart disease status post CABG in March 2015. No recurrent angina. 3. Renal insufficiency 4. Diabetes mellitus type 2 5. Essential hypertension 6. Anemia of chronic disease 7. Peripheral arterial occlusive disease  status post stent to right femoral artery in 2014 .  Disposition: Continue current regimen.  We are checking a basal metabolic panel today.  Recheck in 3 months for follow-up office visit and basal metabolic panel  Current medicines are reviewed at length with the patient today.  The patient does not have concerns regarding medicines.  The following changes have been made:  no change  Labs/ tests ordered today include: Basal metabolic panel   Orders Placed This Encounter  Procedures  . EKG 12-Lead     Disposition:   FU with Dr. Mare Ferrari in 3 months for office visit and basal metabolic panel   Signed, Darlin Coco, MD  04/12/2014 2:19 PM    [facility] [address], [place], [place]  [address] Phone: [phone]; Fax: [phone]

## 2014-05-11 ENCOUNTER — Encounter: Payer: Self-pay | Admitting: Family Medicine

## 2014-05-11 ENCOUNTER — Ambulatory Visit (INDEPENDENT_AMBULATORY_CARE_PROVIDER_SITE_OTHER): Payer: Medicaid Other | Admitting: Family Medicine

## 2014-05-11 VITALS — BP 160/64 | HR 69 | Temp 98.4°F | Ht 68.0 in | Wt 226.1 lb

## 2014-05-11 DIAGNOSIS — K0889 Other specified disorders of teeth and supporting structures: Secondary | ICD-10-CM

## 2014-05-11 DIAGNOSIS — K088 Other specified disorders of teeth and supporting structures: Secondary | ICD-10-CM | POA: Diagnosis not present

## 2014-05-11 DIAGNOSIS — I10 Essential (primary) hypertension: Secondary | ICD-10-CM | POA: Diagnosis not present

## 2014-05-11 DIAGNOSIS — E118 Type 2 diabetes mellitus with unspecified complications: Secondary | ICD-10-CM

## 2014-05-11 DIAGNOSIS — J449 Chronic obstructive pulmonary disease, unspecified: Secondary | ICD-10-CM

## 2014-05-11 DIAGNOSIS — E785 Hyperlipidemia, unspecified: Secondary | ICD-10-CM

## 2014-05-11 DIAGNOSIS — E1140 Type 2 diabetes mellitus with diabetic neuropathy, unspecified: Secondary | ICD-10-CM

## 2014-05-11 LAB — POCT GLYCOSYLATED HEMOGLOBIN (HGB A1C): Hemoglobin A1C: 7.7

## 2014-05-11 MED ORDER — CLONIDINE HCL 0.2 MG PO TABS: 0.2000 mg | ORAL_TABLET | Freq: Two times a day (BID) | ORAL | Status: DC

## 2014-05-11 MED ORDER — OXYCODONE HCL 5 MG PO TABS: 5.0000 mg | ORAL_TABLET | ORAL | Status: DC | PRN

## 2014-05-11 MED ORDER — AMOXICILLIN-POT CLAVULANATE 875-125 MG PO TABS: 1.0000 | ORAL_TABLET | Freq: Two times a day (BID) | ORAL | Status: DC

## 2014-05-11 MED ORDER — GLIPIZIDE 10 MG PO TABS: 10.0000 mg | ORAL_TABLET | Freq: Two times a day (BID) | ORAL | Status: DC

## 2014-05-11 MED ORDER — ATORVASTATIN CALCIUM 40 MG PO TABS: ORAL_TABLET | ORAL | Status: DC

## 2014-05-11 MED ORDER — HYDROCHLOROTHIAZIDE 25 MG PO TABS: 12.5000 mg | ORAL_TABLET | Freq: Every day | ORAL | Status: DC

## 2014-05-11 NOTE — Progress Notes (Signed)
Subjective:    Patient ID: Leonard [name], male    DOB: 06/18/1950, 64 y.o.   MRN: [id number]  HPI  Leonard [name] is here for DM2 and tooth pain.   Tooth pain: started 2 days ago. No fevers or chills. In the left lower mandible. Doesn't have a dentist and hasn't been to the dentist in a few years.   CHRONIC DIABETES  Disease Monitoring  Blood Sugar Ranges: average 152   Polyuria: no   Visual problems: yes, hasn't been able to have his second cataract surgery due to his uncontrolled HTN   Medication Compliance: yes, at times but unsure of on a daily basis.  Medication Side Effects  Hypoglycemia: unsure. Felt as though he has been feeling anxious two times. It was related to his blood glucose being low but these episodes occurred in the afternoon.   He has been using Oxygen at all times. Denies any chest pain. No edema, shortness of breath. He has trouble going to sleep but this is due to watching TV late at night. He takes naps throughout the day in his chair. He is still trying to get an in home aid. He is in charge of taking his medicines currently. He doesn't follow his pill box.   Current Outpatient Prescriptions on File Prior to Visit  Medication Sig Dispense Refill  . ACCU-CHEK FASTCLIX LANCETS MISC 40 Units by Does not apply route 2 (two) times daily. 1 each 5  . amLODipine (NORVASC) 10 MG tablet Take 1 tablet (10 mg total) by mouth daily. 90 tablet 3  . aspirin 81 MG tablet Take 1 tablet (81 mg total) by mouth daily.    Marland Kitchen atorvastatin (LIPITOR) 40 MG tablet TAKE 1 TABLET BY MOUTH EVERY DAY AT 6 PM 90 tablet 1  . B-D ULTRAFINE III SHORT PEN 31G X 8 MM MISC USE AS DIRECTED 100 each 0  . Blood Pressure Monitoring (ADULT BLOOD PRESSURE CUFF LG) KIT Please measure your blood pressure the same time everyday, at least one time a week. 1 each 0  . carvedilol (COREG) 25 MG tablet TAKE 2 TABLETS BY MOUTH TWICE DAILY WITH MEALS 90 tablet 0  . cloNIDine (CATAPRES) 0.2 MG tablet Take  1 tablet (0.2 mg total) by mouth 2 (two) times daily. 60 tablet 5  . furosemide (LASIX) 40 MG tablet 1 and 1/2 tablet daily 45 tablet 5  . glipiZIDE (GLUCOTROL) 10 MG tablet Take 1 tablet (10 mg total) by mouth 2 (two) times daily before a meal. 60 tablet 1  . glucose blood (ACCU-CHEK AVIVA) test strip Use as instructed. QS 3 times a day testing. 100 each 12  . hydrochlorothiazide (HYDRODIURIL) 25 MG tablet Take 0.5 tablets (12.5 mg total) by mouth daily. 30 tablet 5  . Insulin Glargine (LANTUS SOLOSTAR) 100 UNIT/ML Solostar Pen INJECT 40 UNITS UNDER THE SKIN TWICE DAILY 15 mL 5  . Lancet Devices (ACCU-CHEK SOFTCLIX) lancets Use as instructed to test 4 times a day.  Dx code 250.00.  May change to fit patients meter 100 each 12  . lisinopril (PRINIVIL,ZESTRIL) 40 MG tablet Take 1 tablet (40 mg total) by mouth daily. 30 tablet 5  . metFORMIN (GLUCOPHAGE) 1000 MG tablet Take 1 tablet (1,000 mg total) by mouth 2 (two) times daily with a meal. 60 tablet 6  . spironolactone (ALDACTONE) 50 MG tablet Take 1 tablet (50 mg total) by mouth daily. 90 tablet 3   No current facility-administered medications on file prior  to visit.    SHx: quit smoking in 2015   Health Maintenance: needs colonoscopy    Review of Systems See HPI     Objective:   Physical Exam BP 193/76 mmHg  Pulse 69  Temp(Src) 98.4 F (36.9 C) (Oral)  Ht '5\' 8"'  (1.727 m)  Wt 226 lb 1.6 oz (102.558 kg)  BMI 34.39 kg/m2 Gen: NAD, alert, cooperative with exam,  HEENT: poor dentition with several teeth missing. Swollen gum on left lower mandible. Occuring under current tooth still in place, No LAD,  CV: RRR, good S1/S2, no murmur, +1 edema,   Resp: CTABL, no wheezes, non-labored, no crackles  Skin: no rashes, normal turgor  Neuro: no gross deficits.   BP re check 160/64  Patient able to ambulate around the office hallway and kept O2 sats >95% on room air.        Assessment & Plan:

## 2014-05-11 NOTE — Assessment & Plan Note (Signed)
Taken off O2 as was able to ambulate. Quit smoking in 2015.

## 2014-05-11 NOTE — Patient Instructions (Addendum)
Thank you for coming in,   You don't need your O2 since you were able to walk around the hallway without any desaturations.   I have refilled your medications.   Please let me know if you need my help with obtaining an aid.   Please follow up with me in three months.     Please feel free to call with any questions or concerns at any time, at [phone]. --Dr. Raeford Razor  Diet Recommendations for Diabetes   Starchy (carb) foods include: Bread, rice, pasta, potatoes, corn, crackers, bagels, muffins, all baked goods.   Protein foods include: Meat, fish, poultry, eggs, dairy foods, and beans such as pinto and kidney beans (beans also provide carbohydrate).   1. Eat at least 3 meals and 1-2 snacks per day. Never go more than 4-5 hours while awake without eating.  2. Limit starchy foods to TWO per meal and ONE per snack. ONE portion of a starchy  food is equal to the following:   - ONE slice of bread (or its equivalent, such as half of a hamburger bun).   - 1/2 cup of a "scoopable" starchy food such as potatoes or rice.   - 1 OUNCE (28 grams) of starchy snack foods such as crackers or pretzels (look on label).   - 15 grams of carbohydrate as shown on food label.  3. Both lunch and dinner should include a protein food, a carb food, and vegetables.   - Obtain twice as many veg's as protein or carbohydrate foods for both lunch and dinner.   - Try to keep frozen veg's on hand for a quick vegetable serving.     - Fresh or frozen veg's are best.  4. Breakfast should always include protein.

## 2014-05-11 NOTE — Assessment & Plan Note (Signed)
Uncontrolled but not terrible. Seems to be more aware of his diabetes but still questions his adherence to medications and testing.  - will continue with current regimen  - f/u in three months.  - still trying to get home health nurse to help with medication administration and blood sugar testing.

## 2014-05-11 NOTE — Assessment & Plan Note (Signed)
Appears to be infected. No fevers or chills.  - Augmentin BID #20  - Oxy IR #30 PRN for moderate pain  - given list of Dentist taking medicaid.

## 2014-05-12 ENCOUNTER — Encounter: Payer: Self-pay | Admitting: Family Medicine

## 2014-05-12 NOTE — Progress Notes (Signed)
I was preceptor the day of this visit.   

## 2014-05-19 ENCOUNTER — Other Ambulatory Visit: Payer: Self-pay | Admitting: Family Medicine

## 2014-05-31 ENCOUNTER — Other Ambulatory Visit: Payer: Self-pay | Admitting: Family Medicine

## 2014-06-01 ENCOUNTER — Ambulatory Visit (INDEPENDENT_AMBULATORY_CARE_PROVIDER_SITE_OTHER): Payer: Medicaid Other

## 2014-06-01 DIAGNOSIS — B351 Tinea unguium: Secondary | ICD-10-CM | POA: Diagnosis not present

## 2014-06-01 DIAGNOSIS — M79676 Pain in unspecified toe(s): Secondary | ICD-10-CM

## 2014-06-01 DIAGNOSIS — E1140 Type 2 diabetes mellitus with diabetic neuropathy, unspecified: Secondary | ICD-10-CM

## 2014-06-05 ENCOUNTER — Other Ambulatory Visit: Payer: Self-pay | Admitting: Family Medicine

## 2014-06-06 NOTE — Progress Notes (Signed)
HPI Presents today chief complaint of painful elongated toenails.  Objective: Pulses are palpable bilateral nails are thick, yellow dystrophic onychomycosis and painful palpation.   Assessment: Onychomycosis with pain in limb.  Plan: Treatment of nails in thickness and length as covered service secondary to pain.  

## 2014-06-14 ENCOUNTER — Other Ambulatory Visit: Payer: Self-pay | Admitting: Family Medicine

## 2014-06-14 DIAGNOSIS — I5032 Chronic diastolic (congestive) heart failure: Secondary | ICD-10-CM

## 2014-06-27 ENCOUNTER — Other Ambulatory Visit: Payer: Self-pay | Admitting: Family Medicine

## 2014-06-29 ENCOUNTER — Telehealth: Payer: Self-pay | Admitting: Family Medicine

## 2014-06-29 NOTE — Telephone Encounter (Signed)
Spoke with pt.  He says that his weight this AM was "240 something and having leg swelling, but it has went down a little"  Advised that our last wt here in April was 226, so I wanted him to be seen.  Pt denies SOB or CP and is agreeable to appt tomorrow morning.  He will go to ED if at anytime begins with CP and SOB. Shadae Reino, Salome Spotted, CMA

## 2014-06-29 NOTE — Telephone Encounter (Signed)
Dr told him to call if he gained over 3 lbs. He as gained 10\ Please advise

## 2014-06-30 ENCOUNTER — Encounter: Payer: Self-pay | Admitting: Family Medicine

## 2014-06-30 ENCOUNTER — Ambulatory Visit (INDEPENDENT_AMBULATORY_CARE_PROVIDER_SITE_OTHER): Payer: Medicaid Other | Admitting: Family Medicine

## 2014-06-30 VITALS — BP 140/70 | HR 73 | Temp 97.7°F | Ht 68.0 in | Wt 238.0 lb

## 2014-06-30 DIAGNOSIS — I5032 Chronic diastolic (congestive) heart failure: Secondary | ICD-10-CM

## 2014-06-30 DIAGNOSIS — I10 Essential (primary) hypertension: Secondary | ICD-10-CM

## 2014-06-30 LAB — BASIC METABOLIC PANEL WITH GFR
BUN: 24 mg/dL — ABNORMAL HIGH (ref 6–23)
CHLORIDE: 108 meq/L (ref 96–112)
CO2: 24 mEq/L (ref 19–32)
Calcium: 9.6 mg/dL (ref 8.4–10.5)
Creat: 1.22 mg/dL (ref 0.50–1.35)
GFR, Est African American: 72 mL/min
GFR, Est Non African American: 63 mL/min
Glucose, Bld: 115 mg/dL — ABNORMAL HIGH (ref 70–99)
Potassium: 3.7 mEq/L (ref 3.5–5.3)
Sodium: 143 mEq/L (ref 135–145)

## 2014-06-30 MED ORDER — CARVEDILOL 25 MG PO TABS: ORAL_TABLET | ORAL | Status: DC

## 2014-06-30 NOTE — Patient Instructions (Addendum)
Take the Lasix 1 and 1/2 tablets twice daily x 2 days. Then return to 1& 1/2 daily.    Regarding your diet: Lean meats (fish, chicken), Avoid salty foods/fast food.  Continue to weigh yourself daily.   Stop the Metformin.  I have refilled your Coreg.  If you worsen, please go the hospital.  Follow on Tuesday.  Take care  Dr. Lacinda Axon

## 2014-07-04 ENCOUNTER — Encounter: Payer: Self-pay | Admitting: Family Medicine

## 2014-07-04 ENCOUNTER — Ambulatory Visit (INDEPENDENT_AMBULATORY_CARE_PROVIDER_SITE_OTHER): Payer: Medicaid Other | Admitting: Family Medicine

## 2014-07-04 ENCOUNTER — Encounter: Payer: Self-pay | Admitting: Clinical

## 2014-07-04 VITALS — BP 140/62 | HR 68 | Temp 98.4°F | Ht 68.0 in | Wt 235.0 lb

## 2014-07-04 DIAGNOSIS — I5033 Acute on chronic diastolic (congestive) heart failure: Secondary | ICD-10-CM | POA: Diagnosis present

## 2014-07-04 HISTORY — DX: Acute on chronic diastolic (congestive) heart failure: I50.33

## 2014-07-04 LAB — BASIC METABOLIC PANEL WITH GFR
BUN: 38 mg/dL — ABNORMAL HIGH (ref 6–23)
CO2: 24 mEq/L (ref 19–32)
Calcium: 8.9 mg/dL (ref 8.4–10.5)
Chloride: 106 mEq/L (ref 96–112)
Creat: 1.47 mg/dL — ABNORMAL HIGH (ref 0.50–1.35)
GFR, Est African American: 58 mL/min — ABNORMAL LOW
GFR, Est Non African American: 50 mL/min — ABNORMAL LOW
GLUCOSE: 253 mg/dL — AB (ref 70–99)
POTASSIUM: 3.8 meq/L (ref 3.5–5.3)
SODIUM: 141 meq/L (ref 135–145)

## 2014-07-04 NOTE — Progress Notes (Signed)
Order for [facility] sent to [facility].  Hunt Oris, MSW, [place]

## 2014-07-04 NOTE — Assessment & Plan Note (Signed)
Patient with elevated blood pressure in the office. It was rechecked and was at goal. Patient to continue all scheduled medications.

## 2014-07-04 NOTE — Progress Notes (Signed)
I was the preceptor for this visit. 

## 2014-07-04 NOTE — Progress Notes (Signed)
I was the preceptor on the day of this visit.   Olivya Sobol MD  

## 2014-07-04 NOTE — Assessment & Plan Note (Addendum)
Patient with current exacerbation. Unclear inciting factor. Patient is well-appearing on exam is not appear to be any acute distress. No evidence of pulmonary edema on exam.  Patient with increase in weight and increasing lower extremity edema. Weight today 238 (up approximate 10 pounds from his dry weight).  BMP obtained today. I increased patient's Lasix to 60 mg twice daily for the next 2 days. Patient is to follow-up early next week for reevaluation.

## 2014-07-04 NOTE — Progress Notes (Signed)
   Subjective:    Patient ID: Leonard [name], male    DOB: 1950-11-30, 64 y.o.   MRN: [id number]  HPI 64 year old male with complicated past medical history including DM 2, hyperlipidemia, hypertension, peripheral vascular disease, coronary artery disease s/p in STEMI and CABG, and chronic diastolic heart failure presents for same day appointment with complaints of increasing lower extremity edema as well as weight gain.  Diastolic CHF Patient reports that for the past few days to 1 week he's been noticing increasing lower extremity edema.  Additionally, recently had a rise in his weight based on his home scale. No known inciting factors. No exacerbating or relieving factors.  Patient currently denies any shortness of breath or chest pain. He feels otherwise well. No reports of PND orthopnea.  He reports recent compliant with his medications.  Of note, patient was last seen by his cardiologist in March 2016. At that time he was 229 pounds and was felt to be euvolemic.  His dry weight appears to be somewhere around 226-229.   HTN  Well controlled, at goal.   Medications - Coreg, Clonidine, HCTZ, Spironolactone, Lasix, Lisinopril.   Compliance -  Yes.   Social Hx - Former smoker.   Review of Systems  Constitutional: Positive for unexpected weight change. Negative for fever and chills.  Respiratory: Negative for shortness of breath.   Cardiovascular: Positive for leg swelling. Negative for chest pain.      Objective:   Physical Exam Filed Vitals:   06/30/14 1032  BP: 140/70  Pulse:   Temp:    Vital signs reviewed.  Exam: General: Obese male sitting in chair in no acute distress. Cardiovascular: RRR. No murmurs, rubs, or gallops. Respiratory: CTAB. No rales, rhonchi, or wheeze. Abdomen: soft, nontender, nondistended. Extremities: 1-2+ pitting lower extremity edema.  Assessment & Plan:  See Problem List

## 2014-07-04 NOTE — Progress Notes (Signed)
   Subjective:    Patient ID: Leonard [name], male    DOB: 1950/05/03, 64 y.o.   MRN: [id number]  HPI 64 year old male with complicated past medical history including DM 2, hyperlipidemia, hypertension, peripheral vascular disease, coronary artery disease s/p in STEMI and CABG, and chronic diastolic heart failure presents for follow-up.  Patient with recent exacerbation resulting in weight gain and increasing lower extremity edema.  Patient was seen on 5/27 and Lasix was increased. He presents today for follow-up. He states that he is doing well. He took his Lasix as instructed.  He reports that his weight is unchanged on his home scale.  He reports no real change in his edema. His weight is down today (see below).  He denies any associated shortness of breath, PND, orthopnea. No other complaints at this time.  Review of Systems Per HPI    Objective:   Physical Exam Filed Weights   07/04/14 1031  Weight: 235 lb (106.595 kg)   Filed Vitals:   07/04/14 1055  BP: 140/62  Pulse:   Temp:    Vital signs reviewed.  Exam: General: well appearing, NAD. Cardiovascular: RRR. No murmurs, rubs, or gallops. Respiratory: CTAB. No rales, rhonchi, or wheeze. Extremities: 2+ pitting LE edema.     Assessment & Plan:  See Problem List.

## 2014-07-04 NOTE — Patient Instructions (Signed)
Continue your current medications.   Resume your home lasix regimen of 60 mg DAILY until you hear from me.  I am arranging home health for you.  Follow up with Dr. Raeford Razor in the next 1-2 weeks (unless you hear differently from me).

## 2014-07-04 NOTE — Assessment & Plan Note (Signed)
Weight is down to 235 from 238 (but unchanged via home scale). Edema appears to be slightly worse. No current PND orthopnea, shortness of breath, chest pain. Repeating BMP today given increase in Lasix following last visit. Will make further adjustments based on results. Additionally, given that the patient heart failure is difficult to control likely secondary to diet/compliance issues placed an order for home health heart failure.

## 2014-07-06 ENCOUNTER — Telehealth: Payer: Self-pay | Admitting: Family Medicine

## 2014-07-06 NOTE — Telephone Encounter (Signed)
Informed patient of lab results. Weight is still up.  Advised continued use of Lasix 60 mg daily. Recommended follow up tomorrow or Monday.

## 2014-07-07 ENCOUNTER — Ambulatory Visit (INDEPENDENT_AMBULATORY_CARE_PROVIDER_SITE_OTHER): Payer: Medicaid Other | Admitting: Family Medicine

## 2014-07-07 ENCOUNTER — Telehealth: Payer: Self-pay | Admitting: *Deleted

## 2014-07-07 ENCOUNTER — Encounter: Payer: Self-pay | Admitting: Family Medicine

## 2014-07-07 VITALS — BP 144/76 | HR 69 | Temp 98.2°F | Wt 233.0 lb

## 2014-07-07 DIAGNOSIS — I10 Essential (primary) hypertension: Secondary | ICD-10-CM | POA: Diagnosis not present

## 2014-07-07 DIAGNOSIS — E785 Hyperlipidemia, unspecified: Secondary | ICD-10-CM | POA: Diagnosis not present

## 2014-07-07 MED ORDER — ATORVASTATIN CALCIUM 40 MG PO TABS: ORAL_TABLET | ORAL | Status: DC

## 2014-07-07 MED ORDER — ADULT BLOOD PRESSURE CUFF LG KIT: 1.0000 | PACK | Freq: Every day | Status: DC

## 2014-07-07 NOTE — Progress Notes (Signed)
Patient ID: Leonard [name], male   DOB: 1950-05-23, 64 y.o.   MRN: [id number]  [name], MD Phone: [phone]  Leonard [name] is a 64 y.o. male who presents today for same day appointment.   HTN: comes in for appointment following elevated BPs this morning. Notes they were 210/89 and 204/89 prior to taking his medication. He has since taken his medication and his blood pressure has come down.He denies headache, vision changes, chest pain, dyspnea, orthopnea, and PND. He notes his edema has improved since the increased dose of lasix. He notes he feels well. States [facility] is coming to check his blood pressure over the weekend.   PMH: nonsmoker.    ROS: Per HPI   Physical Exam Filed Vitals:   07/07/14 1450  BP: 144/76  Pulse: 69  Temp: 98.2 F (36.8 C)    Gen: Well NAD HEENT: PERRL,  MMM, right sided cataract noted Lungs: CTABL Nl WOB Heart: RRR, no murmur appreciated Exts: 1+ BL  LE, warm and well perfused.    Assessment/Plan: Please see individual problem list.  [name], MD [place] PGY-3

## 2014-07-07 NOTE — Assessment & Plan Note (Signed)
Blood pressure much improved from this morning after taking his medications. Asymptomatic. Is to continue current medications. [facility] is going to come out over the weekend and check his BP per the patient. Will check BMET today given recent lasix treatment. Appears to be doing well from CHF stand point with improved swelling, no orthopnea, and decreasing weight. Given return precautions.

## 2014-07-07 NOTE — Patient Instructions (Signed)
Nice to see you. Your blood pressure is much improved. Please check your blood pressure daily and pick up the home blood pressure cuff.  If you develop chest pain, trouble breathing, vision changes, headache, shortness of breath, or increased edema please seek medical attention.

## 2014-07-07 NOTE — Telephone Encounter (Signed)
Heather, RN with [facility] called stating pt's BP 210/89 left arm and 204/89 right arm.  Pt denies any other  Symptoms.  Pt did not take BP medications prior to blood pressure taken.  Nira Conn was going to have patient take medications now.  Appt with same day provider today at 2:30 PM.  She also requested that provider write Rx for blood pressure cuff.  Will forward to PCP.  Derl Barrow, RN

## 2014-07-08 ENCOUNTER — Encounter: Payer: Self-pay | Admitting: Family Medicine

## 2014-07-08 LAB — BASIC METABOLIC PANEL
BUN: 41 mg/dL — ABNORMAL HIGH (ref 6–23)
CALCIUM: 8.9 mg/dL (ref 8.4–10.5)
CO2: 24 mEq/L (ref 19–32)
Chloride: 105 mEq/L (ref 96–112)
Creat: 1.45 mg/dL — ABNORMAL HIGH (ref 0.50–1.35)
Glucose, Bld: 252 mg/dL — ABNORMAL HIGH (ref 70–99)
POTASSIUM: 3.9 meq/L (ref 3.5–5.3)
Sodium: 140 mEq/L (ref 135–145)

## 2014-07-10 NOTE — Progress Notes (Signed)
I was preceptor the day of this visit.   

## 2014-07-12 ENCOUNTER — Encounter: Payer: Self-pay | Admitting: Cardiology

## 2014-07-12 ENCOUNTER — Ambulatory Visit (INDEPENDENT_AMBULATORY_CARE_PROVIDER_SITE_OTHER): Payer: Medicaid Other | Admitting: Cardiology

## 2014-07-12 VITALS — BP 152/78 | HR 66 | Ht 68.0 in | Wt 235.0 lb

## 2014-07-12 DIAGNOSIS — R609 Edema, unspecified: Secondary | ICD-10-CM

## 2014-07-12 DIAGNOSIS — E1140 Type 2 diabetes mellitus with diabetic neuropathy, unspecified: Secondary | ICD-10-CM | POA: Diagnosis not present

## 2014-07-12 DIAGNOSIS — I10 Essential (primary) hypertension: Secondary | ICD-10-CM

## 2014-07-12 DIAGNOSIS — Z951 Presence of aortocoronary bypass graft: Secondary | ICD-10-CM | POA: Diagnosis not present

## 2014-07-12 NOTE — Patient Instructions (Signed)
Medication Instructions:  Your physician recommends that you continue on your current medications as directed. Please refer to the Current Medication list given to you today.  Labwork: NONE  Testing/Procedures: NONE  Follow-Up: Your physician wants you to follow-up in: 6 MONTH OV/EKG  You will receive a reminder letter in the mail two months in advance. If you don't receive a letter, please call our office to schedule the follow-up appointment.    

## 2014-07-12 NOTE — Progress Notes (Signed)
Cardiology Office Note   Date:  07/12/2014   ID:  Leonard [name], DOB [birth year], MRN [id number]  PCP:  Clearance Coots, MD  Cardiologist: Darlin Coco MD  No chief complaint on file.    History of Present Illness: Leonard [name] is a 64 y.o. male who presents for follow-up office visit.  Leonard [name] is a 64 y.o. male who presents for follow-up office visit. He is a medical patient of Dr. Lacinda Axon of the [facility] family [facility].  Patient has known ischemic heart disease. He underwent coronary artery bypass graft surgery 3 on 04/07/13 I Dr. Prescott Gum. Patient also has a history of peripheral arterial occlusive disease. He had a prior stent to his right femoral artery in December 2014. He has a history of diabetes and a history of hypertension. He has had previous admissions for exacerbation of heart failure. His most recent echocardiogram on 12/09/13 showed normal systolic function with ejection fraction of 60-65% and mild LVH and there was grade 2 diastolic dysfunction. The patient has mild renal insufficiency. In January 2016 his creatinine was 1.18 and his BUN was 32. The patient has not been experiencing any recurrent chest pain or angina. He sleeps on one pillow. He denies any paroxysmal nocturnal dyspnea. The patient is on home oxygen at 2 L a minute around the clock. The patient has a history of chronic anemia. He was a previous heavy smoker. He quit smoking about a year ago. Since last visit he has not had any new cardiac symptoms. He has remained on Lasix 60 mg daily. We are checking renal function again today. He has only trace edema. He is eating well and his weight is up 2 pounds since last visit. He denies any chest pain or shortness of breath.  He states that he has whitecoat hypertension.  He states that at home his blood pressure is normal He states that he is no longer on metformin for his diabetes.  He has not been having any hypoglycemic  episodes.  Past Medical History  Diagnosis Date  . Hypertension   . PAD (peripheral artery disease)   . Gangrene of toe     right 5th/notes 01/04/2013   . High cholesterol   . Type II diabetes mellitus     Past Surgical History  Procedure Laterality Date  . Throat surgery  1983    polyps  . Angioplasty / stenting femoral Right 01/13/2013    superficial femoral artery x 2 (3 mm x 60 mm, 4 mm x 60 mm)  . Tonsillectomy and adenoidectomy  ~ 1965  . Amputation Right 01/28/2013    Procedure: RAY AMPUTATION RIGHT  4th & 5th TOE;  Surgeon: Rosetta Posner, MD;  Location: [facility] OR;  Service: Vascular;  Laterality: Right;  . Coronary artery bypass graft N/A 04/07/2013    Procedure: CORONARY ARTERY BYPASS GRAFTING (CABG);  Surgeon: Ivin Poot, MD;  Location: [place];  Service: Open Heart Surgery;  Laterality: N/A;  . Intraoperative transesophageal echocardiogram N/A 04/07/2013    Procedure: INTRAOPERATIVE TRANSESOPHAGEAL ECHOCARDIOGRAM;  Surgeon: Ivin Poot, MD;  Location: [place];  Service: Open Heart Surgery;  Laterality: N/A;  . Lower extremity angiogram Bilateral 01/06/2013    Procedure: LOWER EXTREMITY ANGIOGRAM;  Surgeon: Conrad Newtown, MD;  Location: [facility] CATH LAB;  Service: Cardiovascular;  Laterality: Bilateral;  . Lower extremity angiogram Right 01/13/2013    Procedure: LOWER EXTREMITY ANGIOGRAM;  Surgeon: Conrad Stagecoach, MD;  Location: [facility]  CATH LAB;  Service: Cardiovascular;  Laterality: Right;  . Left heart catheterization with coronary angiogram N/A 04/05/2013    Procedure: LEFT HEART CATHETERIZATION WITH CORONARY ANGIOGRAM;  Surgeon: Peter M Martinique, MD;  Location: [facility] CATH LAB;  Service: Cardiovascular;  Laterality: N/A;     Current Outpatient Prescriptions  Medication Sig Dispense Refill  . ACCU-CHEK FASTCLIX LANCETS MISC 40 Units by Does not apply route 2 (two) times daily. 1 each 5  . amLODipine (NORVASC) 10 MG tablet Take 1 tablet (10 mg total) by mouth daily. 90 tablet 3  . aspirin 81  MG tablet Take 1 tablet (81 mg total) by mouth daily.    Marland Kitchen atorvastatin (LIPITOR) 40 MG tablet TAKE 1 TABLET BY MOUTH EVERY DAY AT 6 PM 90 tablet 1  . Blood Pressure Monitoring (ADULT BLOOD PRESSURE CUFF LG) KIT 1 Device by Does not apply route daily. 1 each 0  . carvedilol (COREG) 25 MG tablet TAKE 2 TABLETS BY MOUTH TWICE DAILY WITH A MEAL 60 tablet 3  . cloNIDine (CATAPRES) 0.2 MG tablet Take 1 tablet (0.2 mg total) by mouth 2 (two) times daily. 60 tablet 6  . furosemide (LASIX) 40 MG tablet 1 and 1/2 tablet daily 45 tablet 5  . glipiZIDE (GLUCOTROL) 10 MG tablet Take 1 tablet (10 mg total) by mouth 2 (two) times daily before a meal. 120 tablet 1  . glucose blood (ACCU-CHEK AVIVA) test strip Use as instructed. QS 3 times a day testing. 100 each 12  . hydrochlorothiazide (HYDRODIURIL) 25 MG tablet Take 0.5 tablets (12.5 mg total) by mouth daily. 90 tablet 1  . Insulin Pen Needle (B-D ULTRAFINE III SHORT PEN) 31G X 8 MM MISC Use with insulin pen twice daily 100 each 2  . Lancet Devices (ACCU-CHEK SOFTCLIX) lancets Use as instructed to test 4 times a day.  Dx code 250.00.  May change to fit patients meter 100 each 12  . LANTUS SOLOSTAR 100 UNIT/ML Solostar Pen INJECT 40 UNITS INTO THE SKIN TWICE DAILY 15 mL 1  . lisinopril (PRINIVIL,ZESTRIL) 40 MG tablet Take 1 tablet (40 mg total) by mouth daily. 30 tablet 5  . metFORMIN (GLUCOPHAGE) 1000 MG tablet Take 1 tablet (1,000 mg total) by mouth 2 (two) times daily with a meal. 60 tablet 6  . metFORMIN (GLUCOPHAGE) 1000 MG tablet TAKE 1 TABLET BY MOUTH TWICE DAILY WITH A MEAL 60 tablet 0  . oxyCODONE (OXY IR/ROXICODONE) 5 MG immediate release tablet Take 1 tablet (5 mg total) by mouth every 4 (four) hours as needed for severe pain. 30 tablet 0  . spironolactone (ALDACTONE) 50 MG tablet Take 1 tablet (50 mg total) by mouth daily. 90 tablet 3   No current facility-administered medications for this visit.    Allergies:   Review of patient's allergies  indicates no known allergies.    Social History:  The patient  reports that he quit smoking about 17 months ago. His smoking use included Cigarettes. He started smoking about 47 years ago. He has a 46 pack-year smoking history. He has never used smokeless tobacco. He reports that he does not drink alcohol or use illicit drugs.   Family History:  The patient's family history includes Cancer in his mother and sister; Hypertension in his mother and sister.    ROS:  Please see the history of present illness.   Otherwise, review of systems are positive for none.   All other systems are reviewed and negative.    PHYSICAL EXAM:  VS:  BP 152/78 mmHg  Pulse 66  Ht '5\' 8"'  (1.727 m)  Wt 235 lb (106.595 kg)  BMI 35.74 kg/m2 , BMI Body mass index is 35.74 kg/(m^2). GEN: Well nourished, well developed, in no acute distress HEENT: normal Neck: no JVD, carotid bruits, or masses Cardiac: RRR; no murmurs, rubs, or gallops, mild ankle edema  Respiratory:  clear to auscultation bilaterally, normal work of breathing GI: soft, nontender, nondistended, + BS MS: no deformity or atrophy Skin: warm and dry, no rash Neuro:  Strength and sensation are intact Psych: euthymic mood, full affect   EKG:  EKG is not ordered today.    Recent Labs: 08/11/2013: TSH 5.292* 12/08/2013: Pro B Natriuretic peptide (BNP) 2795.0* 12/09/2013: Magnesium 1.7 02/10/2014: ALT 30; B Natriuretic Peptide 246.9* 02/13/2014: Hemoglobin 8.8*; Platelets 147* 07/07/2014: BUN 41*; Creat 1.45*; Potassium 3.9; Sodium 140    Lipid Panel    Component Value Date/Time   CHOL 147 11/25/2012 1546   TRIG 400* 11/25/2012 1546   HDL 27* 11/25/2012 1546   CHOLHDL 5.4 11/25/2012 1546   VLDL 80* 11/25/2012 1546   LDLCALC 40 11/25/2012 1546      Wt Readings from Last 3 Encounters:  07/12/14 235 lb (106.595 kg)  07/07/14 233 lb (105.688 kg)  07/04/14 235 lb (106.595 kg)        ASSESSMENT AND PLAN:  1. Chronic diastolic heart failure  with preserved ejection fraction. He currently appears to be euvolemic 2. Ischemic heart disease status post CABG in March 2015. No recurrent angina. 3. Renal insufficiency 4. Diabetes mellitus type 2 5. Essential hypertension 6. Anemia of chronic disease 7. 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  Disposition: Continue current regimen.  Has mild ankle edema consistent with his amlodipine.  Recheck in 6 months for office visit and EKG   Current medicines are reviewed at length with the patient today.  The patient does not have concerns regarding medicines.  The following changes have been made:  no change  Labs/ tests ordered today include:  No orders of the defined types were placed in this encounter.      Berna Spare MD 07/12/2014 5:52 PM    [facility] [address], [place], [place]  [address] Phone: [phone]; Fax: [phone]

## 2014-07-14 NOTE — Telephone Encounter (Signed)
Script for blood pressure cuff is placed at the front for patient to pick up.   Rosemarie Ax, MD PGY-2, [facility] 07/14/2014, 3:22 PM

## 2014-07-17 NOTE — Telephone Encounter (Signed)
Informed pt of below. Zimmerman Rumple, Yuvia Plant D, CMA  

## 2014-07-26 ENCOUNTER — Telehealth: Payer: Self-pay | Admitting: *Deleted

## 2014-07-26 DIAGNOSIS — E1121 Type 2 diabetes mellitus with diabetic nephropathy: Secondary | ICD-10-CM

## 2014-07-26 NOTE — Telephone Encounter (Signed)
Spoke with Nira Conn with [facility] about Mr. Allbright. Informed to increase his lantua AM and PM by one unit until his morning blood sugar is below 120.  Will continue to hold his metformin until his kidney function is checked again.  Also changed his pharmacy to [facility] so they can send him pre-packaged pills as he is not able to fill his pill box.   Rosemarie Ax, MD PGY-2, [facility] 07/26/2014, 2:22 PM

## 2014-07-26 NOTE — Telephone Encounter (Signed)
Heather, RN with [facility] called stating blood sugars was 325 today and has been running between 200-240's.  Pt has not taken any metformin in several weeks thinking it was discontinued.  Reviewed chart, no record that medication was discontinued, last refill sent was 06/01/14.  Nira Conn was also asking if patient could switch pharmacies to on that can prepackage patient's medication.  They will need prescriptions for all current pt's medications.  She also stated that pt' requested new diabetic shoes.  Advise patient to get order form from [facility] and bring it to clinic for provider to complete.  Please give her a call at [phone].  Derl Barrow, RN

## 2014-08-08 ENCOUNTER — Other Ambulatory Visit: Payer: Self-pay | Admitting: Family Medicine

## 2014-08-08 DIAGNOSIS — E1159 Type 2 diabetes mellitus with other circulatory complications: Secondary | ICD-10-CM

## 2014-08-25 ENCOUNTER — Encounter: Payer: Self-pay | Admitting: Family Medicine

## 2014-08-25 ENCOUNTER — Telehealth: Payer: Self-pay | Admitting: Family Medicine

## 2014-08-25 ENCOUNTER — Ambulatory Visit (INDEPENDENT_AMBULATORY_CARE_PROVIDER_SITE_OTHER): Payer: Medicaid Other | Admitting: Family Medicine

## 2014-08-25 VITALS — BP 158/80 | HR 65 | Temp 98.3°F | Ht 68.0 in | Wt 233.3 lb

## 2014-08-25 DIAGNOSIS — Z1159 Encounter for screening for other viral diseases: Secondary | ICD-10-CM

## 2014-08-25 DIAGNOSIS — I5032 Chronic diastolic (congestive) heart failure: Secondary | ICD-10-CM | POA: Diagnosis not present

## 2014-08-25 DIAGNOSIS — E1140 Type 2 diabetes mellitus with diabetic neuropathy, unspecified: Secondary | ICD-10-CM

## 2014-08-25 DIAGNOSIS — I10 Essential (primary) hypertension: Secondary | ICD-10-CM | POA: Diagnosis not present

## 2014-08-25 DIAGNOSIS — E1165 Type 2 diabetes mellitus with hyperglycemia: Secondary | ICD-10-CM

## 2014-08-25 DIAGNOSIS — E119 Type 2 diabetes mellitus without complications: Secondary | ICD-10-CM

## 2014-08-25 LAB — POCT URINALYSIS DIPSTICK
BILIRUBIN UA: NEGATIVE
Glucose, UA: 100
KETONES UA: NEGATIVE
Leukocytes, UA: NEGATIVE
Nitrite, UA: NEGATIVE
PH UA: 5.5
Protein, UA: NEGATIVE
Spec Grav, UA: 1.01
Urobilinogen, UA: 0.2

## 2014-08-25 LAB — POCT UA - MICROSCOPIC ONLY: Trichomonas, UA: POSITIVE

## 2014-08-25 LAB — BASIC METABOLIC PANEL WITH GFR
BUN: 52 mg/dL — AB (ref 6–23)
CHLORIDE: 108 meq/L (ref 96–112)
CO2: 18 meq/L — AB (ref 19–32)
Calcium: 8.7 mg/dL (ref 8.4–10.5)
Creat: 1.7 mg/dL — ABNORMAL HIGH (ref 0.50–1.35)
GFR, Est African American: 49 mL/min — ABNORMAL LOW
GFR, Est Non African American: 42 mL/min — ABNORMAL LOW
Glucose, Bld: 318 mg/dL — ABNORMAL HIGH (ref 70–99)
POTASSIUM: 4.3 meq/L (ref 3.5–5.3)
Sodium: 138 mEq/L (ref 135–145)

## 2014-08-25 LAB — POCT GLYCOSYLATED HEMOGLOBIN (HGB A1C): HEMOGLOBIN A1C: 10.3

## 2014-08-25 MED ORDER — METRONIDAZOLE 500 MG PO TABS: 2000.0000 mg | ORAL_TABLET | Freq: Once | ORAL | Status: DC

## 2014-08-25 MED ORDER — GLIPIZIDE 10 MG PO TABS: 10.0000 mg | ORAL_TABLET | Freq: Two times a day (BID) | ORAL | Status: DC

## 2014-08-25 MED ORDER — CARVEDILOL 25 MG PO TABS: ORAL_TABLET | ORAL | Status: DC

## 2014-08-25 MED ORDER — GLUCOSE BLOOD VI STRP: ORAL_STRIP | Status: DC

## 2014-08-25 MED ORDER — AMLODIPINE BESYLATE 10 MG PO TABS: 10.0000 mg | ORAL_TABLET | Freq: Every day | ORAL | Status: DC

## 2014-08-25 MED ORDER — SPIRONOLACTONE 50 MG PO TABS: 50.0000 mg | ORAL_TABLET | Freq: Every day | ORAL | Status: DC

## 2014-08-25 NOTE — Telephone Encounter (Signed)
Spoke with patient about UA results and positive Trich. Reports not being sexually active and currently asymptomatic. Will send in 2 g Flagyl.   Rosemarie Ax, MD PGY-3, [facility] 08/25/2014, 4:32 PM

## 2014-08-25 NOTE — Assessment & Plan Note (Addendum)
Uncontrolled.  Blood pressure is improved compared to previous.  Stressed the importance of a low salt diet as patient eats a lot of processed food and deli meat. - refilled medications and continue current regimen -  UA and BMP today

## 2014-08-25 NOTE — Patient Instructions (Signed)
Thank you for coming in,   Increase your insulin by 1 unit morning and at night until your morning blood sugar is below 130.    Please follow-up with me in 3 months.   Please call with any questions regards to your increase in insulin.  Please bring all of your medications with you to each visit.    Please feel free to call with any questions or concerns at any time, at [phone]. --Dr. Raeford Razor  Diet Recommendations for Diabetes   Starchy (carb) foods include: Bread, rice, pasta, potatoes, corn, crackers, bagels, muffins, all baked goods.   Protein foods include: Meat, fish, poultry, eggs, dairy foods, and beans such as pinto and kidney beans (beans also provide carbohydrate).   1. Eat at least 3 meals and 1-2 snacks per day. Never go more than 4-5 hours while awake without eating.  2. Limit starchy foods to TWO per meal and ONE per snack. ONE portion of a starchy  food is equal to the following:   - ONE slice of bread (or its equivalent, such as half of a hamburger bun).   - 1/2 cup of a "scoopable" starchy food such as potatoes or rice.   - 1 OUNCE (28 grams) of starchy snack foods such as crackers or pretzels (look on label).   - 15 grams of carbohydrate as shown on food label.  3. Both lunch and dinner should include a protein food, a carb food, and vegetables.   - Obtain twice as many veg's as protein or carbohydrate foods for both lunch and dinner.   - Try to keep frozen veg's on hand for a quick vegetable serving.     - Fresh or frozen veg's are best.  4. Breakfast should always include protein.

## 2014-08-25 NOTE — Assessment & Plan Note (Signed)
Worsening A1c today. Has been holding metformin based on renal function. -  Will increase by 1 unit morning and afternoon until morning blood sugar is below 130 -  Given instructions for this and informed to call me with any questions. -  TSH and BMP today -  Has had diabetic shoes current pair have worn out. Based on his history of  Fourth and fifth digit amputation on right footand left forefoot ulcer recommend that diabetic shoes are necessary.  Informed to send me the information and will fill out the form

## 2014-08-25 NOTE — Addendum Note (Signed)
Addended by: Maryland Pink on: 08/25/2014 11:11 AM   Modules accepted: Orders

## 2014-08-25 NOTE — Progress Notes (Addendum)
Subjective:     HPI  Leonard [name] is here for f/u.  CHRONIC DIABETES  Patient reports having diabetic shoes and they are over a year old. He is requesting new shoes. He has a hx of gangrene and amputation of his 4th and 5th digit. He also had an ulcer on left plantar foot. Have been holding his Metformin due to his recent Cr increase. Will test blood work today. His A1c is elevated compared to three month ago.    Disease Monitoring  Blood Sugar Ranges: reports trending down but A1c elevated am fasting 150 -269  Polyuria: no   Visual problems: no   Medication Compliance: yes, reported   Medication Side Effects  Hypoglycemia: no   Preventitive Health Care  Eye Exam: still hasn't been able to get cataract removed due to bp being above 160   Foot Exam: today   Diet pattern: un balanced. Processed food with high salt content   Exercise: limited    HTN Disease Monitoring: Home BP Monitoring 140/75 Chest pain- no     Dyspnea- no Medications:clonidine, Spiro, coreg, amlodipine, lisinopril, HCTZ  Compliance-  Yes, reported .  Lightheadedness-  no   Edema- mild      Health Maintenance:  Health Maintenance Due  Topic Date Due  . OPHTHALMOLOGY EXAM  06/21/1961  . TETANUS/TDAP  06/22/1970  . COLONOSCOPY  06/21/2001  . ZOSTAVAX  06/22/2011  . FOOT EXAM  08/12/2014    -  reports that he quit smoking about 18 months ago. His smoking use included Cigarettes. He started smoking about 47 years ago. He has a 46 pack-year smoking history. He has never used smokeless tobacco. - Review of Systems: Per HPI. All other systems reviewed and are negative. - Past Medical History: Patient Active Problem List   Diagnosis Date Noted  . Acute on chronic diastolic heart failure 35/59/7416  . Tooth pain 05/11/2014  . Chronic diastolic heart failure 38/45/3646  . COPD (chronic obstructive pulmonary disease) 02/16/2014  . Diastolic heart failure   . Bilateral lower extremity edema  10/14/2013  . History of tobacco use 08/23/2013  . 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 CAD (coronary artery disease) 04/06/2013  . NSTEMI (non-ST elevated myocardial infarction) 04/05/2013  . PVD - hx of Rt SFA PTA and s/p Rt 4-5th toe amp Dec 2014 04/05/2013  . Atherosclerosis of native arteries of the extremities with gangrene 02/15/2013  . Normocytic anemia 01/26/2013  . Essential hypertension, benign 11/25/2012  . Diabetes mellitus, type 2 01/06/2012  . Mixed hyperlipidemia 07/17/2008   - Medications: reviewed and updated Current Outpatient Prescriptions  Medication Sig Dispense Refill  . ACCU-CHEK FASTCLIX LANCETS MISC 40 Units by Does not apply route 2 (two) times daily. 1 each 5  . amLODipine (NORVASC) 10 MG tablet Take 1 tablet (10 mg total) by mouth daily. 90 tablet 3  . aspirin 81 MG tablet Take 1 tablet (81 mg total) by mouth daily.    Marland Kitchen atorvastatin (LIPITOR) 40 MG tablet TAKE 1 TABLET BY MOUTH EVERY DAY AT 6 PM 90 tablet 1  . Blood Pressure Monitoring (ADULT BLOOD PRESSURE CUFF LG) KIT 1 Device by Does not apply route daily. 1 each 0  . carvedilol (COREG) 25 MG tablet TAKE 2 TABLETS BY MOUTH TWICE DAILY WITH A MEAL 60 tablet 3  . cloNIDine (CATAPRES) 0.2 MG tablet Take 1 tablet (0.2 mg total) by mouth 2 (two) times daily. 60 tablet 6  . furosemide (LASIX) 40 MG  tablet 1 and 1/2 tablet daily 45 tablet 5  . glipiZIDE (GLUCOTROL) 10 MG tablet Take 1 tablet (10 mg total) by mouth 2 (two) times daily before a meal. 120 tablet 1  . glucose blood (ACCU-CHEK AVIVA) test strip Use as instructed. QS 3 times a day testing. 100 each 12  . hydrochlorothiazide (HYDRODIURIL) 25 MG tablet Take 0.5 tablets (12.5 mg total) by mouth daily. 90 tablet 1  . Insulin Pen Needle (B-D ULTRAFINE III SHORT PEN) 31G X 8 MM MISC Use with insulin pen twice daily 100 each 2  . Lancet Devices (ACCU-CHEK SOFTCLIX) lancets Use as instructed to test 4 times a day.  Dx code 250.00.  May change to fit patients meter 100  each 12  . LANTUS SOLOSTAR 100 UNIT/ML Solostar Pen ADMINISTER 40 UNITS UNDER THE SKIN TWICE DAILY 15 mL 5  . lisinopril (PRINIVIL,ZESTRIL) 40 MG tablet Take 1 tablet (40 mg total) by mouth daily. 30 tablet 5  . metFORMIN (GLUCOPHAGE) 1000 MG tablet Take 1 tablet (1,000 mg total) by mouth 2 (two) times daily with a meal. 60 tablet 6  . metFORMIN (GLUCOPHAGE) 1000 MG tablet TAKE 1 TABLET BY MOUTH TWICE DAILY WITH A MEAL 60 tablet 0  . oxyCODONE (OXY IR/ROXICODONE) 5 MG immediate release tablet Take 1 tablet (5 mg total) by mouth every 4 (four) hours as needed for severe pain. 30 tablet 0  . spironolactone (ALDACTONE) 50 MG tablet Take 1 tablet (50 mg total) by mouth daily. 90 tablet 3   No current facility-administered medications for this visit.     Review of Systems See HPI     Objective:   Physical Exam BP 158/80 mmHg  Pulse 65  Temp(Src) 98.3 F (36.8 C) (Oral)  Ht '5\' 8"'  (1.727 m)  Wt 233 lb 4.8 oz (105.824 kg)  BMI 35.48 kg/m2 Gen: NAD, alert, cooperative with exam, well-appearing CV: RRR, good S1/S2, no murmur,  Trace edema bilateral lower extremities Resp: CTABL, no wheezes, non-labored  MSK :  +2 pulses bilateral feet. Status post fourth and fifth digit and dictation of right foot.  callus formation on left plantar aspect of forefoot. Sensation and range of motion intact Skin: no rashes, normal turgor  Neuro: no gross deficits.      Assessment & Plan:

## 2014-08-26 LAB — TSH: TSH: 8.799 u[IU]/mL — ABNORMAL HIGH (ref 0.350–4.500)

## 2014-08-26 LAB — HEPATITIS C ANTIBODY: HCV Ab: NEGATIVE

## 2014-08-31 ENCOUNTER — Ambulatory Visit (INDEPENDENT_AMBULATORY_CARE_PROVIDER_SITE_OTHER): Payer: Medicaid Other | Admitting: Podiatry

## 2014-08-31 ENCOUNTER — Encounter: Payer: Self-pay | Admitting: Podiatry

## 2014-08-31 DIAGNOSIS — M79675 Pain in left toe(s): Secondary | ICD-10-CM

## 2014-08-31 DIAGNOSIS — M79676 Pain in unspecified toe(s): Secondary | ICD-10-CM

## 2014-08-31 DIAGNOSIS — I739 Peripheral vascular disease, unspecified: Secondary | ICD-10-CM

## 2014-08-31 DIAGNOSIS — M79674 Pain in right toe(s): Secondary | ICD-10-CM | POA: Diagnosis not present

## 2014-08-31 DIAGNOSIS — B351 Tinea unguium: Secondary | ICD-10-CM | POA: Diagnosis not present

## 2014-08-31 DIAGNOSIS — E1140 Type 2 diabetes mellitus with diabetic neuropathy, unspecified: Secondary | ICD-10-CM

## 2014-08-31 NOTE — Progress Notes (Signed)
Patient presents for follow up  For treatment of hos mycotic nails.  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 GENERAL APPEARANCE: Alert, conversant. Appropriately groomed. No acute distress.  VASCULAR: Pedal pulses are non palpable. PVD is being treated by Dr. Donnetta Hutching. He has cool toes and absence of hair growth  NEUROLOGIC: sensation is absent epicritically and protectively to 5.07 monofilament at 0/5 sites bilateral.   MUSCULOSKELETAL: partial amputation of the lateral foot right has been performed in the past. It appears to be digits 4,5 and metatarsal 5 as well. Contracture of digits present biilateral.  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 NAILS  Thick disfigured discolored nails both feet.   Assessment:  Onychomycosis  Diabetic angiopathy and neuropathy  Plan: debridement of nails.  Given paperwork for new shoes from [employer].  RTC 3 months

## 2014-09-06 ENCOUNTER — Encounter: Payer: Self-pay | Admitting: Family Medicine

## 2014-10-09 ENCOUNTER — Other Ambulatory Visit: Payer: Self-pay | Admitting: Cardiology

## 2014-10-25 ENCOUNTER — Other Ambulatory Visit: Payer: Self-pay | Admitting: Family Medicine

## 2014-11-21 ENCOUNTER — Ambulatory Visit (INDEPENDENT_AMBULATORY_CARE_PROVIDER_SITE_OTHER): Payer: Medicaid Other | Admitting: Family Medicine

## 2014-11-21 ENCOUNTER — Encounter: Payer: Self-pay | Admitting: Family Medicine

## 2014-11-21 ENCOUNTER — Telehealth: Payer: Self-pay | Admitting: Family Medicine

## 2014-11-21 VITALS — BP 141/58 | HR 62 | Temp 98.5°F | Ht 68.0 in | Wt 237.9 lb

## 2014-11-21 DIAGNOSIS — Z794 Long term (current) use of insulin: Secondary | ICD-10-CM | POA: Diagnosis not present

## 2014-11-21 DIAGNOSIS — Z23 Encounter for immunization: Secondary | ICD-10-CM | POA: Diagnosis not present

## 2014-11-21 DIAGNOSIS — I5033 Acute on chronic diastolic (congestive) heart failure: Secondary | ICD-10-CM

## 2014-11-21 DIAGNOSIS — Z0000 Encounter for general adult medical examination without abnormal findings: Secondary | ICD-10-CM

## 2014-11-21 DIAGNOSIS — R0602 Shortness of breath: Secondary | ICD-10-CM

## 2014-11-21 DIAGNOSIS — E119 Type 2 diabetes mellitus without complications: Secondary | ICD-10-CM | POA: Diagnosis not present

## 2014-11-21 DIAGNOSIS — E039 Hypothyroidism, unspecified: Secondary | ICD-10-CM | POA: Diagnosis not present

## 2014-11-21 LAB — BASIC METABOLIC PANEL WITH GFR
BUN: 53 mg/dL — AB (ref 7–25)
CHLORIDE: 107 mmol/L (ref 98–110)
CO2: 20 mmol/L (ref 20–31)
Calcium: 8.8 mg/dL (ref 8.6–10.3)
Creat: 2.34 mg/dL — ABNORMAL HIGH (ref 0.70–1.25)
GFR, Est African American: 33 mL/min — ABNORMAL LOW (ref >=60)
GFR, Est Non African American: 29 mL/min — ABNORMAL LOW (ref >=60)
GLUCOSE: 225 mg/dL — AB (ref 65–99)
Potassium: 4 mmol/L (ref 3.5–5.3)
Sodium: 140 mmol/L (ref 135–146)

## 2014-11-21 NOTE — Progress Notes (Signed)
Patient ID: Leonard [name], male   DOB: 1950-06-14, 63 y.o.   MRN: [id number]   Subjective: CC: Check-up HPI: This history was provided by the patient.  Patient is a 64 y.o. male presenting to clinic today for physical and diabetes follow-up.  Patient has a PMH significant for DM type 2, HTN, CAD, diastolic heart failure, myocardial infarction, hyperlipidemia, atherosclerosis, 3 vessel CABG and PVD.  Patient states he is generally feeling well and has no specific complaints other than left lower extremity edema, chronic occasional tingling in soles of both feet and chronic loose stools with occasional nocturnal incontinence of stool.  Patient is uncertain as to when LLE edema began and denies shortness of breath and cough.  Patient associates the loose stools with the Metformin he takes to control his DM.  Patient also denies chest pain, abdominal pain, headache, dizziness, changes in vision, N/V/D, hematuria, dysuria, polyuria, hematochezia, melena, joint pain, nasal/sinus congestion and loss of appetite.    Patient states he checks his blood sugar at home each morning before breakfast.  He states his highest reading over last month was 306 and his lowest was 138.  Patient states he has a CNA who comes to his home 3 times a week and checks his blood pressure.  Patient states his SBP typically runs 140-180 and his DBP is typically 75-80.  He states he walks for exercise daily.  Concerns today include: 1. Check-up 2. Diabetes   Social History Reviewed: never smoker, denies use of EtOH and illicit drugs FamHx and MedHx updated.  Please see EMR.  ROS: Per HPI  Objective: Office vital signs reviewed. BP 165/66 mmHg  Pulse 62  Temp(Src) 98.5 F (36.9 C) (Oral)  Ht 5\' 8"  (1.727 m)  Wt 237 lb 14.4 oz (107.911 kg)  BMI 36.18 kg/m2  Physical Examination:  General: Awake, alert, well-nourished, NAD HEENT: Normal    Neck: No masses palpated. No LAD    Ears: TMs intact, normal light reflex, no  erythema, no bulging, no otorrhea.    Eyes: Sclera white with no injection.  No drainage noted.  Normal light reflex and red reflex left eye, no nicking or papilledema.  Cataract right eye.  Arcus senilis noted bilaterally.    Nose: nasal turbinates moist    Throat: MMM, no erythema Cardio: RRR, S1S2 heard, no murmurs appreciated.  +2 left pre-tibial edema, trace right pre-tibial edema, no cyanosis or clubbing; +2 radial and dorsalis pedis pulses bilaterally Pulm: CTAB, no wheezes, rhonchi or rales.  Patient speaking in complete sentences, no increased work of breathing. GI: soft, NT/ND,+BS x4, no hepatomegaly, no splenomegaly GU: deffered Extremities: MAE, no crepitus.  Diabetic foot exam completed.  No ulcers noted.  Patient had small callous on left sole that was removed in clinic.  Skin otherwise intact.  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 MSK: Normal gait and station, no arthralgias Skin: dry, intact, no rashes or lesions Neuro: Strength and sensation grossly intact, CNII-XII grossly intact.    Assessment/ Plan: 64 y.o. male with several chronic illnesses including poorly managed DM type 2, diastolic heart failure, potential hypothyroidism and hypertension.    1. SOB (shortness of breath) Patient has some LE edema bilaterally, worse LLE, but denies dyspnea and his room air oxygen saturation was 97%. Patient speaking in complete sentences with no increased work of breathing and ambulates without shortness of breath.  Patient encouraged to double his daily Lasix dose to 120 mg on 10/19 and  10/20 and return to clinic for a physician visit on 10/21.  Patient agreed to this plan.   - BASIC METABOLIC PANEL WITH GFR - Brain natriuretic peptide  2. Diabetes mellitus type 2 Patient's DM is poorly controlled.  Patient complains of loose stools related to Metformin and would like to stop taking it.  Patient encouraged to continue taking Metformin and agreed to continue  taking it in a single dose once a day versus his current twice a day dosing.  Will consider adding another agent for better DM control depending on patient's insurance coverage.  A weekly injectable might be appropriate as patient's healthcare literacy is not adequate for sliding scale insulin.    3. Hypothyroidism, unspecified hypothyroidism type - TSH - T4, free - T3, Free  4. Encounter for immunization - Influenza vaccination given today in clinic  5. Need for Tdap vaccination - Given in clinic today - Tdap vaccine greater than orequal to 63yo IM  6.  Health maintenance - Patient agreed to make an appointment with his ophthalmologist for a dilated eye exam within next month - Patient has an podiatry exam scheduled in next several weeks - Discussed need for colonoscopy with patient and he agreed to follow-up if a referral is made - Discussed need for Zostavax vaccination.  Those are not offered in clinic, but patient may obtain one at a commercial pharmacy.  Sherron Ales, NP [facility]

## 2014-11-21 NOTE — Telephone Encounter (Signed)
Patient is needing a prescription sent to [facility] for diabetic shoes.

## 2014-11-21 NOTE — Patient Instructions (Addendum)
Thank you for coming in,   Please increase the amount of lasix that you are taking to 120 mg over the next three days.   Please follow up on Friday.   You can decrease your metformin to once a day to see if that helps your diarrhea. Please call me if it doesn't.   Please seek immediate care if you cannot catch your breath or you have chest pain associated with nausea, vomiting, or sweating.   Please bring all of your medications with you to each visit.   Sign up for My Chart to have easy access to your labs results, and communication with your Primary care physician   Please feel free to call with any questions or concerns at any time, at [phone]. --Dr. Raeford Razor

## 2014-11-22 DIAGNOSIS — E039 Hypothyroidism, unspecified: Secondary | ICD-10-CM | POA: Insufficient documentation

## 2014-11-22 DIAGNOSIS — Z0000 Encounter for general adult medical examination without abnormal findings: Secondary | ICD-10-CM | POA: Insufficient documentation

## 2014-11-22 HISTORY — DX: Hypothyroidism, unspecified: E03.9

## 2014-11-22 LAB — T4, FREE: Free T4: 0.96 ng/dL (ref 0.80–1.80)

## 2014-11-22 LAB — TSH: TSH: 9.673 u[IU]/mL — AB (ref 0.350–4.500)

## 2014-11-22 LAB — T3, FREE: T3 FREE: 2.2 pg/mL — AB (ref 2.3–4.2)

## 2014-11-22 LAB — BRAIN NATRIURETIC PEPTIDE: BRAIN NATRIURETIC PEPTIDE: 181.1 pg/mL — AB (ref 0.0–100.0)

## 2014-11-22 NOTE — Assessment & Plan Note (Signed)
Uncontrolled. Blood sugar ranges 138-306 - may benefit from GLP-1. Once weekly dosing and benefit of weight loss - will have to discuss this further.

## 2014-11-22 NOTE — Addendum Note (Signed)
Addended by: Rosemarie Ax on: 11/22/2014 09:03 PM   Modules accepted: Miquel Dunn

## 2014-11-22 NOTE — Assessment & Plan Note (Signed)
Received Tdap and flu vaccine  Needs colonoscopy - referral sent  Informed on how to obtain shingles vaccine  He will f/u with his opthalmologist .

## 2014-11-22 NOTE — Assessment & Plan Note (Signed)
Weight is increased from baseline  Worsening edema and SOB  No PND, orthopnea, or chest pain Reports being compliant. Unsure of cause. Possible to be related to his newly diagnosed hypothyroidism.   - double lasix to 120 mg daily for three days  - BMP, BNP - worsening SCr noticed on results. Discussed with Dr. Nori Riis. Will go ahead and treat with Scr. He will follow up on Friday for re-check his SCr.  He may need admission if his kidney function worsens and unable to diurese as an outpatient.

## 2014-11-22 NOTE — Assessment & Plan Note (Signed)
Most likely subclinical.  Would most likely benefit with treatment in setting of HF and TSH >4.

## 2014-11-24 ENCOUNTER — Ambulatory Visit (HOSPITAL_COMMUNITY)
Admission: RE | Admit: 2014-11-24 | Discharge: 2014-11-24 | Disposition: A | Discharge status: Home / Self Care | Payer: Medicaid Other | Source: Ambulatory Visit | Attending: Family Medicine | Admitting: Family Medicine

## 2014-11-24 ENCOUNTER — Other Ambulatory Visit: Payer: Self-pay

## 2014-11-24 ENCOUNTER — Encounter: Payer: Self-pay | Admitting: Family Medicine

## 2014-11-24 ENCOUNTER — Ambulatory Visit (INDEPENDENT_AMBULATORY_CARE_PROVIDER_SITE_OTHER): Payer: Medicaid Other | Admitting: Family Medicine

## 2014-11-24 VITALS — BP 138/70 | HR 78 | Temp 98.5°F | Ht 68.0 in | Wt 238.0 lb

## 2014-11-24 DIAGNOSIS — I5033 Acute on chronic diastolic (congestive) heart failure: Secondary | ICD-10-CM

## 2014-11-24 DIAGNOSIS — R9431 Abnormal electrocardiogram [ECG] [EKG]: Secondary | ICD-10-CM | POA: Insufficient documentation

## 2014-11-24 DIAGNOSIS — N179 Acute kidney failure, unspecified: Secondary | ICD-10-CM | POA: Diagnosis not present

## 2014-11-24 LAB — BASIC METABOLIC PANEL
BUN: 59 mg/dL — AB (ref 7–25)
CALCIUM: 8.5 mg/dL — AB (ref 8.6–10.3)
CO2: 24 mmol/L (ref 20–31)
Chloride: 102 mmol/L (ref 98–110)
Creat: 2.8 mg/dL — ABNORMAL HIGH (ref 0.70–1.25)
GLUCOSE: 179 mg/dL — AB (ref 65–99)
POTASSIUM: 3.9 mmol/L (ref 3.5–5.3)
SODIUM: 138 mmol/L (ref 135–146)

## 2014-11-24 NOTE — Addendum Note (Signed)
Addended by: Leeanne Rio on: 11/24/2014 02:14 PM   Modules accepted: Orders

## 2014-11-24 NOTE — Patient Instructions (Addendum)
Please continue the current dose of lasix Do not take the lisinopril and spironolactone until we get your blood results from today. Follow up Monday or Tuesday of next week in the clinic.  Be well, Dr. Ardelia Mems

## 2014-11-24 NOTE — Progress Notes (Addendum)
Date of Visit: 11/24/2014   HPI:  Patient presents for follow up of CHF exacerbation.  Saw Dr. Raeford Razor 3 days ago on 10/18 and noted to be volume overloaded with some dyspnea. Lasix increased from 60mg  three times daily to 120mg  three times daily at that visit. Labwork from that day showed AKI with creatinine 2.34, up from 1.7 when last checked in July 2016 (prior baseline 1.3-1.5).   Patient reports he has done well since his visit earlier this week. Is urinating a lot. Has noticed improvement in swelling, but not all the way resolved. Breathing is at baseline. Denies chest pain. Eating and drinking well. Otherwise feels well. Has been taking all of his medications as prescribed, including lisinopril and spironolactone, this week.  ROS: See HPI.  [place]: history of diastolic CHF, PAD, CAD, COPD, type 2 diabetes, hypertension, hyperlipidemia, CABG x3, NSTEMI  PHYSICAL EXAM: BP 138/70 mmHg  Pulse 78  Temp(Src) 98.5 F (36.9 C) (Oral)  Ht 5\' 8"  (1.727 m)  Wt 238 lb (107.956 kg)  BMI 36.20 kg/m2  O2 sat 95% ambulating on room air Gen: NAD, pleasant and cooperative HEENT: NCAT Heart: regular rate and rhythm no murmur Lungs: clear to auscultation bilaterally, normal work of breathing, no focal crackles Neuro: grossly nonfocal, speech normal Ext: 2+ edema bilateral lower extremities, improved per patient  ASSESSMENT/PLAN:  Acute on chronic diastolic heart failure Symptomatic improvement, but not resolution, on lasix 160 mg three times daily. Will continue present dose. No hypoxia. EKG today unchanged from prior. AKI noted from 10/18 (2.34, up from prior 1.7, baseline before that 1.3-1.5). Will hold lisinopril and spironolactone until today's BMET returns. As I am going out of town tomorrow, will sign out follow up of lab to Dr. Mingo Amber, who will be covering my inbox. If creatinine significantly elevated on today's labs, may need admission for serial BMETs, versus outpatient  management. Update echo - ordered, will have staff call him with appointment time Follow up in clinic on Monday or Tuesday for recheck.   FOLLOW UP: F/u in 3-4 days for CHF.  [place]. Ardelia Mems, [place]

## 2014-11-24 NOTE — Assessment & Plan Note (Addendum)
Symptomatic improvement, but not resolution, on lasix 160 mg three times daily. Will continue present dose. No hypoxia. EKG today unchanged from prior. AKI noted from 10/18 (2.34, up from prior 1.7, baseline before that 1.3-1.5). Will hold lisinopril and spironolactone until today's BMET returns. As I am going out of town tomorrow, will sign out follow up of lab to Dr. Mingo Amber, who will be covering my inbox. If creatinine significantly elevated on today's labs, may need admission for serial BMETs, versus outpatient management. Update echo - ordered, will have staff call him with appointment time Follow up in clinic on Monday or Tuesday for recheck.

## 2014-11-24 NOTE — Telephone Encounter (Signed)
Called [facility] and they will mail the form.   Rosemarie Ax, MD PGY-3, [facility] 11/24/2014, 12:11 PM

## 2014-11-25 ENCOUNTER — Emergency Department (HOSPITAL_COMMUNITY)
Admission: EM | Admit: 2014-11-25 | Discharge: 2014-11-25 | Disposition: A | Discharge status: Home / Self Care | Payer: Medicaid Other | Attending: Emergency Medicine | Admitting: Emergency Medicine

## 2014-11-25 ENCOUNTER — Encounter (HOSPITAL_COMMUNITY): Payer: Self-pay

## 2014-11-25 ENCOUNTER — Emergency Department (HOSPITAL_COMMUNITY): Payer: Medicaid Other

## 2014-11-25 DIAGNOSIS — E785 Hyperlipidemia, unspecified: Secondary | ICD-10-CM | POA: Insufficient documentation

## 2014-11-25 DIAGNOSIS — Z79899 Other long term (current) drug therapy: Secondary | ICD-10-CM | POA: Diagnosis not present

## 2014-11-25 DIAGNOSIS — M7051 Other bursitis of knee, right knee: Secondary | ICD-10-CM | POA: Diagnosis not present

## 2014-11-25 DIAGNOSIS — E119 Type 2 diabetes mellitus without complications: Secondary | ICD-10-CM | POA: Diagnosis not present

## 2014-11-25 DIAGNOSIS — Z87891 Personal history of nicotine dependence: Secondary | ICD-10-CM | POA: Diagnosis not present

## 2014-11-25 DIAGNOSIS — Z9889 Other specified postprocedural states: Secondary | ICD-10-CM | POA: Diagnosis not present

## 2014-11-25 DIAGNOSIS — M25561 Pain in right knee: Secondary | ICD-10-CM | POA: Diagnosis present

## 2014-11-25 DIAGNOSIS — Z792 Long term (current) use of antibiotics: Secondary | ICD-10-CM | POA: Insufficient documentation

## 2014-11-25 DIAGNOSIS — Z950 Presence of cardiac pacemaker: Secondary | ICD-10-CM | POA: Insufficient documentation

## 2014-11-25 DIAGNOSIS — Z794 Long term (current) use of insulin: Secondary | ICD-10-CM | POA: Insufficient documentation

## 2014-11-25 DIAGNOSIS — I10 Essential (primary) hypertension: Secondary | ICD-10-CM | POA: Insufficient documentation

## 2014-11-25 DIAGNOSIS — M7050 Other bursitis of knee, unspecified knee: Secondary | ICD-10-CM

## 2014-11-25 DIAGNOSIS — G8929 Other chronic pain: Secondary | ICD-10-CM

## 2014-11-25 DIAGNOSIS — Z7982 Long term (current) use of aspirin: Secondary | ICD-10-CM | POA: Diagnosis not present

## 2014-11-25 DIAGNOSIS — M25562 Pain in left knee: Secondary | ICD-10-CM | POA: Diagnosis not present

## 2014-11-25 DIAGNOSIS — Z9861 Coronary angioplasty status: Secondary | ICD-10-CM | POA: Insufficient documentation

## 2014-11-25 DIAGNOSIS — M7052 Other bursitis of knee, left knee: Secondary | ICD-10-CM | POA: Diagnosis not present

## 2014-11-25 DIAGNOSIS — Z87898 Personal history of other specified conditions: Secondary | ICD-10-CM

## 2014-11-25 DIAGNOSIS — E7800 Pure hypercholesterolemia, unspecified: Secondary | ICD-10-CM | POA: Insufficient documentation

## 2014-11-25 DIAGNOSIS — Y9389 Activity, other specified: Secondary | ICD-10-CM | POA: Insufficient documentation

## 2014-11-25 MED ORDER — HYDROCODONE-ACETAMINOPHEN 5-325 MG PO TABS
1.0000 | ORAL_TABLET | Freq: Once | ORAL | Status: AC
  Administered 2014-11-25: 1 via ORAL
  Filled 2014-11-25: qty 1

## 2014-11-25 MED ORDER — PREDNISONE 20 MG PO TABS: ORAL_TABLET | ORAL | Status: DC

## 2014-11-25 NOTE — Discharge Instructions (Signed)
Take prednisone as directed, keep in mind this will increase your blood sugar slightly for a few days but this is okay. Alternate between ice and heat, and elevate knees throughout the day. Use home pain medications as needed for pain. Follow up with his primary doctor on Tuesday at his already scheduled appointment. Take home lasix as instructed. Call orthopedic follow up today or tomorrow to schedule followup appointment for recheck of ongoing knee pain in one to two weeks that can be canceled with a 24-48 hour notice if complete resolution of pain. Return to the ER for changes or worsening symptoms.   Bursitis Bursitis is when the fluid-filled sac (bursa) that covers and protects a joint is swollen (inflamed). Bursitis is most common near joints, especially the knees, elbows, hips, and shoulders.  HOME CARE  Take medicines only as told by your doctor.  If you were prescribed an antibiotic medicine, finish it all even if you start to feel better.  Rest the affected area as told by your doctor.  Keep the area raised up.  Avoid doing things that make the pain worse.  Apply ice to the injured area:  Place ice in a plastic bag.  Place a towel between your skin and the bag.  Leave the ice on for 20 minutes, 2-3 times a day.  Use splints, braces, pads, or walking aids as told by your doctor.  Keep all follow-up visits as told by your doctor. This is important. GET HELP IF:   You have more pain with home care.  You have a fever.  You have chills.   This information is not intended to replace advice given to you by your health care provider. Make sure you discuss any questions you have with your health care provider.   Document Released: 07/10/2009 Document Revised: 02/10/2014 Document Reviewed: 04/11/2013 Elsevier Interactive Patient Education 2016 [employer]  Cryotherapy Cryotherapy is when you put ice on your injury. Ice helps lessen pain and puffiness (swelling) after an  injury. Ice works the best when you start using it in the first 24 to 48 hours after an injury. HOME CARE  Put a dry or damp towel between the ice pack and your skin.  You may press gently on the ice pack.  Leave the ice on for no more than 10 to 20 minutes at a time.  Check your skin after 5 minutes to make sure your skin is okay.  Rest at least 20 minutes between ice pack uses.  Stop using ice when your skin loses feeling (numbness).  Do not use ice on someone who cannot tell you when it hurts. This includes small children and people with memory problems (dementia). GET HELP RIGHT AWAY IF:  You have white spots on your skin.  Your skin turns blue or pale.  Your skin feels waxy or hard.  Your puffiness gets worse. MAKE SURE YOU:   Understand these instructions.  Will watch your condition.  Will get help right away if you are not doing well or get worse.   This information is not intended to replace advice given to you by your health care provider. Make sure you discuss any questions you have with your health care provider.   Document Released: 07/09/2007 Document Revised: 04/14/2011 Document Reviewed: 09/12/2010 Elsevier Interactive Patient Education 2016 [place] therapy can help ease sore, stiff, injured, and tight muscles and joints. Heat relaxes your muscles, which may help ease your pain. Heat therapy  should only be used on old, pre-existing, or long-lasting (chronic) injuries. Do not use heat therapy unless told by your doctor. HOW TO USE HEAT THERAPY There are several different kinds of heat therapy, including:  Moist heat pack.  Warm water bath.  Hot water bottle.  Electric heating pad.  Heated gel pack.  Heated wrap.  Electric heating pad. GENERAL HEAT THERAPY RECOMMENDATIONS   Do not sleep while using heat therapy. Only use heat therapy while you are awake.  Your skin may turn pink while using heat therapy. Do not use  heat therapy if your skin turns red.  Do not use heat therapy if you have new pain.  High heat or long exposure to heat can cause burns. Be careful when using heat therapy to avoid burning your skin.  Do not use heat therapy on areas of your skin that are already irritated, such as with a rash or sunburn. GET HELP IF:   You have blisters, redness, swelling (puffiness), or numbness.  You have new pain.  Your pain is worse. MAKE SURE YOU:  Understand these instructions.  Will watch your condition.  Will get help right away if you are not doing well or get worse.   This information is not intended to replace advice given to you by your health care provider. Make sure you discuss any questions you have with your health care provider.   Document Released: 04/14/2011 Document Revised: 02/10/2014 Document Reviewed: 03/15/2013 Elsevier Interactive Patient Education 2016 Elsevier Inc.  Knee Pain Knee pain is a common problem. It can have many causes. The pain often goes away by following your doctor's home care instructions. Treatment for ongoing pain will depend on the cause of your pain. If your knee pain continues, more tests may be needed to diagnose your condition. Tests may include X-rays or other imaging studies of your knee. HOME CARE  Take medicines only as told by your doctor.  Rest your knee and keep it raised (elevated) while you are resting.  Do not do things that cause pain or make your pain worse.  Avoid activities where both feet leave the ground at the same time, such as running, jumping rope, or doing jumping jacks.  Apply ice to the knee area:  Put ice in a plastic bag.  Place a towel between your skin and the bag.  Leave the ice on for 20 minutes, 2-3 times a day.  Ask your doctor if you should wear an elastic knee support.  Sleep with a pillow under your knee.  Lose weight if you are overweight. Being overweight can make your knee hurt more.  Do not  use any tobacco products, including cigarettes, chewing tobacco, or electronic cigarettes. If you need help quitting, ask your doctor. Smoking may slow the healing of any bone and joint problems that you may have. GET HELP IF:  Your knee pain does not stop, it changes, or it gets worse.  You have a fever along with knee pain.  Your knee gives out or locks up.  Your knee becomes more swollen. GET HELP RIGHT AWAY IF:   Your knee feels hot to the touch.  You have chest pain or trouble breathing.   This information is not intended to replace advice given to you by your health care provider. Make sure you discuss any questions you have with your health care provider.   Document Released: 04/18/2008 Document Revised: 02/10/2014 Document Reviewed: 03/23/2013 Elsevier Interactive Patient Education [employer].

## 2014-11-25 NOTE — ED Notes (Signed)
PT Sister at desk to report Her brother was at his PCP on Friday for CHF. PCP increased PO lasix to 120mg   PO three times a  Day . Pt has skipped  2 doses  Of lasix today.

## 2014-11-25 NOTE — ED Notes (Signed)
Pt able to ambulate from wheelchair to truck with assistance from RN

## 2014-11-25 NOTE — ED Provider Notes (Signed)
CSN: [id number]     Arrival date & time 11/25/14  1808 History  By signing my name below, I, Starleen Arms, attest that this documentation has been prepared under the direction and in the presence of Brissia Delisa Camprubi-Soms, PA-C. Electronically Signed: Starleen Arms ED Scribe. 11/25/2014. 6:18 PM.    Chief Complaint  Patient presents with  . Knee Pain   Patient is a 64 y.o. male presenting with knee pain. The history is provided by the patient. No language interpreter was used.  Knee Pain Location:  Knee Time since incident:  1 day Injury: no   Knee location:  L knee and R knee Pain details:    Quality:  Aching   Radiates to:  Does not radiate   Severity:  Moderate   Onset quality:  Gradual   Duration:  1 day   Timing:  Intermittent (with standing)   Progression:  Unchanged Chronicity:  Recurrent Dislocation: no   Foreign body present:  No foreign bodies Prior injury to area:  No Relieved by:  Arthritis medications (topical cream) Worsened by:  Bearing weight Ineffective treatments:  None tried Associated symptoms: no decreased ROM, no fever, no muscle weakness, no numbness, no swelling and no tingling    HPI Comments: Leonard [name] is a 64 y.o. male with PMHx of of HTN, HLD, PAD, DM II, and chronic knee pain, who presents to the Emergency Department complaining of gradual onset aching, 5/10, nonradiating bilateral knee pain onset last night without fall or injury that is intermittent and only present when bearing weight/walking; somewhat relieved by unknown OTC topical arthritis cream, and worsens with standing/walking.  He denies prior hx of knee injury or hx of gout.  He reports being prescribed Lasix but did not take this medication today, and noticed his legs were slightly swollen which is chronic and at baseline, but his knees are not swollen.  He denies bruising, erythema, numbness/tingling, weakness, fever, chills, CP, SOB, abdominal pain, n/v/d/c, dysuria, or hematuria.   NKA. States this is similar to knee pain he has had in the past   Past Medical History  Diagnosis Date  . Hypertension   . PAD (peripheral artery disease) ([place])   . Gangrene of toe ([place])     right 5th/notes 01/04/2013   . High cholesterol   . Type II diabetes mellitus [facility])    Past Surgical History  Procedure Laterality Date  . Throat surgery  1983    polyps  . Angioplasty / stenting femoral Right 01/13/2013    superficial femoral artery x 2 (3 mm x 60 mm, 4 mm x 60 mm)  . Tonsillectomy and adenoidectomy  ~ 1965  . Amputation Right 01/28/2013    Procedure: RAY AMPUTATION RIGHT  4th & 5th TOE;  Surgeon: Rosetta Posner, MD;  Location: [facility] OR;  Service: Vascular;  Laterality: Right;  . Coronary artery bypass graft N/A 04/07/2013    Procedure: CORONARY ARTERY BYPASS GRAFTING (CABG);  Surgeon: Ivin Poot, MD;  Location: [facility];  Service: Open Heart Surgery;  Laterality: N/A;  . Intraoperative transesophageal echocardiogram N/A 04/07/2013    Procedure: INTRAOPERATIVE TRANSESOPHAGEAL ECHOCARDIOGRAM;  Surgeon: Ivin Poot, MD;  Location: [place];  Service: Open Heart Surgery;  Laterality: N/A;  . Lower extremity angiogram Bilateral 01/06/2013    Procedure: LOWER EXTREMITY ANGIOGRAM;  Surgeon: Conrad Rhodes, MD;  Location[facility] C[facility] LAB;  Service: Cardiovascular;  Laterality: Bilateral;  . Lower extremity angiogram Right 01/13/2013    Procedure: LOWER  EXTREMITY ANGIOGRAM;  Surgeon: Conrad [place], MD;  Location: [facility] CATH LAB;  Service: Cardiovascular;  Laterality: Right;  . Left heart catheterization with coronary angiogram N/A 04/05/2013    Procedure: LEFT HEART CATHETERIZATION WITH CORONARY ANGIOGRAM;  Surgeon: Peter M Martinique, MD;  Location: [facility] CATH LAB;  Service: Cardiovascular;  Laterality: N/A;   Family History  Problem Relation Age of Onset  . Cancer Mother     lung cancer  . Hypertension Mother   . Cancer Sister     patient thinks it was uterine cancer  . Hypertension Sister    Social  History  Substance Use Topics  . Smoking status: Former Smoker -- 1.00 packs/day for 46 years    Types: Cigarettes    Start date: 02/04/1967    Quit date: 02/03/2013  . Smokeless tobacco: Never Used     Comment: Doing well with quitting.  . Alcohol Use: No    Review of Systems  Constitutional: Negative for fever and chills.  Respiratory: Negative for shortness of breath.   Cardiovascular: Negative for chest pain.  Gastrointestinal: Negative for nausea, vomiting, abdominal pain, diarrhea and constipation.  Genitourinary: Negative for dysuria and hematuria.  Musculoskeletal: Positive for arthralgias (b/l knees). Negative for joint swelling.  Skin: Negative for color change.  Allergic/Immunologic: Positive for immunocompromised state (diabetic).  Neurological: Negative for weakness and numbness.   10 Systems reviewed and all are negative for acute change except as noted in the HPI.   Allergies  Review of patient's allergies indicates no known allergies.  Home Medications   Prior to Admission medications   Medication Sig Start Date End Date Taking? Authorizing Provider  ACCU-CHEK FASTCLIX LANCETS MISC 40 Units by Does not apply route 2 (two) times daily. 09/20/13   Rosemarie Ax, MD  amLODipine (NORVASC) 10 MG tablet Take 1 tablet (10 mg total) by mouth daily. 08/25/14   Rosemarie Ax, MD  aspirin 81 MG tablet Take 1 tablet (81 mg total) by mouth daily. 09/15/13   Zenia Resides, MD  atorvastatin (LIPITOR) 40 MG tablet TAKE 1 TABLET BY MOUTH EVERY DAY AT 6 PM 07/07/14   Leone Haven, MD  B-D ULTRAFINE III SHORT PEN 31G X 8 MM MISC USE WITH INSULIN PEN TWICE DAILY 10/25/14   Rosemarie Ax, MD  Blood Pressure Monitoring (ADULT BLOOD PRESSURE CUFF LG) KIT 1 Device by Does not apply route daily. 07/07/14   Leone Haven, MD  carvedilol (COREG) 25 MG tablet TAKE 2 TABLETS BY MOUTH TWICE DAILY WITH A MEAL 08/25/14   Rosemarie Ax, MD  cloNIDine (CATAPRES) 0.2 MG tablet Take  1 tablet (0.2 mg total) by mouth 2 (two) times daily. 05/11/14   Rosemarie Ax, MD  furosemide (LASIX) 40 MG tablet 1 and 1/2 tablet daily 04/12/14   Darlin Coco, MD  glipiZIDE (GLUCOTROL) 10 MG tablet Take 1 tablet (10 mg total) by mouth 2 (two) times daily before a meal. 08/25/14   Rosemarie Ax, MD  glucose blood (ACCU-CHEK AVIVA) test strip Use as instructed. QS 3 times a day testing. 08/25/14   Rosemarie Ax, MD  hydrochlorothiazide (HYDRODIURIL) 25 MG tablet Take 0.5 tablets (12.5 mg total) by mouth daily. 05/11/14   Rosemarie Ax, MD  Lancet Devices [facility]) lancets Use as instructed to test 4 times a day.  Dx code 250.00.  May change to fit patients meter 05/10/13   Rosemarie Ax, MD  LANTUS SOLOSTAR 100 UNIT/ML Solostar  Pen ADMINISTER 40 UNITS UNDER THE SKIN TWICE DAILY 08/08/14   Rosemarie Ax, MD  lisinopril (PRINIVIL,ZESTRIL) 40 MG tablet TAKE 1 TABLET(40 MG) BY MOUTH DAILY 10/10/14   Darlin Coco, MD  metFORMIN (GLUCOPHAGE) 1000 MG tablet Take 1 tablet (1,000 mg total) by mouth 2 (two) times daily with a meal. 08/23/13   Rosemarie Ax, MD  metFORMIN (GLUCOPHAGE) 1000 MG tablet TAKE 1 TABLET BY MOUTH TWICE DAILY WITH A MEAL 06/01/14   Rosemarie Ax, MD  metroNIDAZOLE (FLAGYL) 500 MG tablet Take 4 tablets (2,000 mg total) by mouth once. 08/25/14   Rosemarie Ax, MD  oxyCODONE (OXY IR/ROXICODONE) 5 MG immediate release tablet Take 1 tablet (5 mg total) by mouth every 4 (four) hours as needed for severe pain. 05/11/14   Rosemarie Ax, MD  spironolactone (ALDACTONE) 50 MG tablet Take 1 tablet (50 mg total) by mouth daily. 08/25/14   Rosemarie Ax, MD   BP 162/71 mmHg  Pulse 73  Temp(Src) 99.9 F (37.7 C) (Oral)  Resp 18  Ht '5\' 8"'  (1.727 m)  Wt 236 lb (107.049 kg)  BMI 35.89 kg/m2  SpO2 99% Physical Exam  Constitutional: He is oriented to person, place, and time. Vital signs are normal. He appears well-developed and well-nourished.  Non-toxic  appearance. No distress.  Afebrile, nontoxic, NAD  HENT:  Head: Normocephalic and atraumatic.  Mouth/Throat: Mucous membranes are normal.  Eyes: Conjunctivae and EOM are normal. Right eye exhibits no discharge. Left eye exhibits no discharge.  Neck: Normal range of motion. Neck supple.  Cardiovascular: Normal rate and intact distal pulses.   Pulmonary/Chest: Effort normal. No respiratory distress.  Abdominal: Normal appearance. He exhibits no distension.  Musculoskeletal: Normal range of motion.       Right knee: He exhibits normal range of motion, no swelling, no effusion, no ecchymosis, no erythema, normal alignment, no LCL laxity, normal patellar mobility and no MCL laxity. Tenderness found. Lateral joint line tenderness noted.       Left knee: He exhibits normal range of motion, no swelling, no effusion, no ecchymosis, no erythema, normal alignment, no LCL laxity, normal patellar mobility and no MCL laxity. Tenderness found. Lateral joint line tenderness noted.  Bilateral knees with FROM intact, with mild lateral joint line TTP, no swelling/effusion/deformity, no bruising or erythema, no abnormal alignment or patellar mobility, no varus/valgus laxity, neg anterior drawer test, no crepitus. Strength and sensation grossly intact, distal pulses intact. 1-2+ bilateral pedal edema which is chronic per patient. Gait steady with cane, nonantalgic  Neurological: He is alert and oriented to person, place, and time. He has normal strength. No sensory deficit. Gait normal.  Skin: Skin is warm, dry and intact. No rash noted.  Psychiatric: He has a normal mood and affect.  Nursing note and vitals reviewed.   ED Course  Procedures (including critical care time)  DIAGNOSTIC STUDIES: Oxygen Saturation is 99% on RA, normal by my interpretation.    COORDINATION OF CARE:  6:27 PM Will order imaging and prescribe pain medication. Patient acknowledges and agrees with plan.   Labs Review Labs Reviewed -  No data to display  Imaging Review Dg Knee Complete 4 Views Left  11/25/2014  CLINICAL DATA:  Acute left knee pain without known injury. EXAM: LEFT KNEE - COMPLETE 4+ VIEW COMPARISON:  None. FINDINGS: There is no evidence of fracture or dislocation. Mild suprapatellar joint effusion is noted. There is no evidence of arthropathy or other focal bone abnormality. Vascular calcifications  are noted. IMPRESSION: Mild suprapatellar joint effusion. No other significant abnormality seen in the left knee. Electronically Signed   By: Marijo Conception, M.D.   On: 11/25/2014 19:36   Dg Knee Complete 4 Views Right  11/25/2014  CLINICAL DATA:  Anterior knee pain for 1 day EXAM: RIGHT KNEE - COMPLETE 4+ VIEW COMPARISON:  None. FINDINGS: Osseous alignment is normal. No fracture line or displaced fracture fragment seen. No acute - appearing cortical irregularity or osseous lesion. Bone mineralization is normal. No significant degenerative change seen. Extensive atherosclerotic vascular calcifications are seen throughout the femoral, popliteal, and upper calf arteries. Suspect at least a small joint effusion within the suprapatellar bursa. IMPRESSION: 1. Probable small joint effusion within the suprapatellar bursa. 2. Extensive atherosclerotic vascular calcifications throughout the femoral, popliteal and upper calf arteries. 3. No acute osseous abnormality. 4. No significant degenerative change. Electronically Signed   By: Franki Cabot M.D.   On: 11/25/2014 19:38   I have personally reviewed and evaluated these images and lab results as part of my medical decision-making.   EKG Interpretation None      MDM   Final diagnoses:  Bilateral knee pain  History of peripheral edema  Bilateral chronic knee pain  Bursitis of knee, unspecified laterality    64 y.o. male here with atraumatic b/l knee pain onset last night, states he's had knee pain before when he walks on it, no formal dx of arthritis, has not had dx of  gout. On exam, no erythema or warmth, no effusion, doubt septic joint or gout. NVI with soft compartments. Pt ambulatory but with lateral joint line tenderness, will obtain xray to eval for possible arthritis. Will give pain medication and reassess shortly.   7:52 PM Xrays showing suprapatellar effusion, likely bursitis. Doubt septic bursitis, could be gout given that pt recently had AKI (is currently holding lisinopril and spironolactone for this) which could've triggered gout, but pt without hx of gout and the knees don't appear erythematous or warm. Will treat with prednisone for antiinflammation given that pt has extensive cardiac disease and AKI therefore want to avoid NSAIDs. Discussed that this will increase his sugars slightly. Discussed taking his home lasix. Home pain meds for pain. Ice/heat and elevate for pain relief and swelling improvement. F/up with PCP in 2 days at already scheduled appt, and with ortho in 1-2wks for ongoing joint pain. I explained the diagnosis and have given explicit precautions to return to the ER including for any other new or worsening symptoms. The patient understands and accepts the medical plan as it's been dictated and I have answered their questions. Discharge instructions concerning home care and prescriptions have been given. The patient is STABLE and is discharged to home in good condition.  I personally performed the services described in this documentation, which was scribed in my presence. The recorded information has been reviewed and is accurate.   BP 162/71 mmHg  Pulse 73  Temp(Src) 99.9 F (37.7 C) (Oral)  Resp 18  Ht '5\' 8"'  (1.727 m)  Wt 236 lb (107.049 kg)  BMI 35.89 kg/m2  SpO2 99%  Meds ordered this encounter  Medications  . HYDROcodone-acetaminophen (NORCO/VICODIN) 5-325 MG per tablet 1 tablet    Sig:   . predniSONE (DELTASONE) 20 MG tablet    Sig: 3 tabs po daily x 4 days    Dispense:  12 tablet    Refill:  0    Order Specific  Question:  Supervising Provider  Answer:  Noemi Chapel [address] Camprubi-Soms, PA-C 11/25/14 1954  Varney Biles, MD 11/28/14 [id number]

## 2014-11-25 NOTE — ED Notes (Signed)
PA previously aware of pt's recent PCP visit

## 2014-11-25 NOTE — ED Notes (Signed)
PER EMS: pt from home w/ reports of bilateral knee pain onset yesterday. He denies fall or any other injury or surgery. Pain worse with movement and ambulation. Denies swelling stating, "they just ache when I put weight on them."

## 2014-11-27 ENCOUNTER — Telehealth: Payer: Self-pay | Admitting: Family Medicine

## 2014-11-27 NOTE — Telephone Encounter (Signed)
Called and spoke with patient's sister as patient was sleeping.  Relayed that the patient's creatinine is continuing to rise.  I see he has an appt with Dr. Lajuana Ripple tomorrow.  I encouraged her to make sure he follows up tomorrow.  She reported that he is feeling well without any complaints.

## 2014-11-28 ENCOUNTER — Encounter: Payer: Self-pay | Admitting: Family Medicine

## 2014-11-28 ENCOUNTER — Telehealth: Payer: Self-pay | Admitting: Family Medicine

## 2014-11-28 ENCOUNTER — Ambulatory Visit (INDEPENDENT_AMBULATORY_CARE_PROVIDER_SITE_OTHER): Payer: Medicaid Other | Admitting: Family Medicine

## 2014-11-28 VITALS — BP 166/78 | HR 67 | Temp 98.5°F | Wt 237.8 lb

## 2014-11-28 DIAGNOSIS — N289 Disorder of kidney and ureter, unspecified: Secondary | ICD-10-CM | POA: Diagnosis not present

## 2014-11-28 DIAGNOSIS — I5032 Chronic diastolic (congestive) heart failure: Secondary | ICD-10-CM

## 2014-11-28 DIAGNOSIS — E119 Type 2 diabetes mellitus without complications: Secondary | ICD-10-CM | POA: Diagnosis present

## 2014-11-28 LAB — POCT GLYCOSYLATED HEMOGLOBIN (HGB A1C): Hemoglobin A1C: 10

## 2014-11-28 NOTE — Assessment & Plan Note (Signed)
Recently instructed to discontinue Spironolactone and ACE-I.  Lasix, per patient, was increased to 3 tabs daily.  Apparently, improved with this regimen - Recommended follow up with Cardiology for further medication management, esp in the setting of impaired renal function - Patient to resume normal dosing of Lasix until evaluated by cards. - Echo scheduled for 11/1. - Follow up with PCP as scheduled for routine care

## 2014-11-28 NOTE — Assessment & Plan Note (Signed)
Still uncontrolled. A1c today 10.0.  On Lantus, glipizide.  Recent discontinuation of Metformin in the setting of impaired renal function GFR <30. - Recommended follow up with PCP, may consider referral to endocrinology vs Dr Valentina Lucks - Patient to continue to monitor sugars daily as directed. - Would consider obtaining a microalbumin if no plans to restart ACE-I. - Follow up with PCP in 2 weeks for DM management

## 2014-11-28 NOTE — Progress Notes (Signed)
    Subjective: CC: multiple concerns HPI: Patient is a 64 y.o. male presenting to clinic today for follow up visit. Concerns today include:  1. Diabetes:  Monitors once daily. High at home: 452 Low at home: 113 Average 200's. Taking medications: Lantus 45 unit BID, glipizide 10 mg BID Discontinued Metformin 2/2 Diarrhea Side effects: occ lightheadedness  ROS: denies fever, chills, dizziness, LOC, polydipsia, numbness or tingling in extremities or chest pain. Endorses polyuria. Last eye exam: 02/2014 Last foot exam: 11/21/14 Last A1c: 10.3 (08/25/14) Nephropathy screen indicated?: was on ACE-I but was instructed to discontinue a week ago Last flu, zoster and/or pneumovax: UTD  2. CHF Patient takes 3 tablets of furosemide.  Patient reports that swelling has improved since increasing Lasix.  Fatigue has improved with change in medication.  Has been told that his kidney function was decreased.   Discontinued Spironolactone.  Also discontinued ACE-I at previous appt with Dr Ardelia Mems. No CP, SOB.  Normal urination.  No orthopnea.    3. Urinary incontinence Patient notes that he occasionally wets himself.  He denies dysuria, hematuria, fevers.  Endorses nocturia.  Wants to have Rx for [facility] for Depends underwear.  Social History Reviewed: former smoker. FamHx and MedHx updated.  Please see EMR.  ROS: Per HPI  Objective: Office vital signs reviewed. BP 166/78 mmHg  Pulse 67  Temp(Src) 98.5 F (36.9 C) (Oral)  Wt 237 lb 12.8 oz (107.865 kg)  SpO2 98%  Physical Examination:  General: Awake, alert, well nourished, NAD HEENT: Normal, MMM, JVP 8 Cardio: RRR, S1S2 heard, no murmurs appreciated Pulm: CTAB, no wheezes, rhonchi or rales, normal WOB GI: obese, soft, NT/ND,+BS x4, no masses Extremities: WWP, +1 pitting LE edema to mid shin, no cyanosis or clubbing; +2 radial pulses bilaterally MSK: Normal gait and station Skin: dry, intact, no rashes or lesions  Assessment/ Plan: 64  y.o. male with  Diabetes mellitus, type 2 Still uncontrolled. A1c today 10.0.  On Lantus, glipizide.  Recent discontinuation of Metformin in the setting of impaired renal function GFR <30. - Recommended follow up with PCP, may consider referral to endocrinology vs Dr Valentina Lucks - Patient to continue to monitor sugars daily as directed. - Would consider obtaining a microalbumin if no plans to restart ACE-I. - Follow up with PCP in 2 weeks for DM management  Chronic diastolic heart failure Recently instructed to discontinue Spironolactone and ACE-I.  Lasix, per patient, was increased to 3 tabs daily.  Apparently, improved with this regimen - Recommended follow up with Cardiology for further medication management, esp in the setting of impaired renal function - Patient to resume normal dosing of Lasix until evaluated by cards. - Echo scheduled for 11/1. - Follow up with PCP as scheduled for routine care  Impaired renal function.  Several medications held incl Metformin, Lisinopril and Spironolactone.  Worsening renal function may be 2/2 worsening HF vs uncontrolled DM. - Recommended to continue to hold these medications for now. - BASIC METABOLIC PANEL WITH GFR - Return precautions reviewed. - Patient to follow up in 1-2 weeks for repeat BP check.  May need to adjust medication regimen.  Urinary incontinence. Does not appear infectious. - Will defer to PCP to order depends through home health care agency.  Janora Norlander, DO PGY-2, [place]

## 2014-11-28 NOTE — Telephone Encounter (Signed)
Checking status of request for an ortho shoe for [employer], says it has been sent to [place] several times since august.

## 2014-11-28 NOTE — Patient Instructions (Addendum)
Please take your medications as directed.  Plan to follow up with Dr Raeford Razor for you sugar and heart failure.  If you develop shortness of breath, abdominal pain, difficulty urinating or chest pain, seek immediate care in the emergency department.  Otherwise follow up with Dr Raeford Razor in 2 weeks.  Schedule an appointment with Dr Mare Ferrari.  Heart Failure Heart failure means your heart has trouble pumping blood. This makes it hard for your body to work well. Heart failure is usually a long-term (chronic) condition. You must take good care of yourself and follow your doctor's treatment plan. HOME CARE  Take your heart medicine as told by your doctor.  Do not stop taking medicine unless your doctor tells you to.  Do not skip any dose of medicine.  Refill your medicines before they run out.  Take other medicines only as told by your doctor or pharmacist.  Stay active if told by your doctor. The elderly and people with severe heart failure should talk with a doctor about physical activity.  Eat heart-healthy foods. Choose foods that are without trans fat and are low in saturated fat, cholesterol, and salt (sodium). This includes fresh or frozen fruits and vegetables, fish, lean meats, fat-free or low-fat dairy foods, whole grains, and high-fiber foods. Lentils and dried peas and beans (legumes) are also good choices.  Limit salt if told by your doctor.  Cook in a healthy way. Roast, grill, broil, bake, poach, steam, or stir-fry foods.  Limit fluids as told by your doctor.  Weigh yourself every morning. Do this after you pee (urinate) and before you eat breakfast. Write down your weight to give to your doctor.  Take your blood pressure and write it down if your doctor tells you to.  Ask your doctor how to check your pulse. Check your pulse as told.  Lose weight if told by your doctor.  Stop smoking or chewing tobacco. Do not use gum or patches that help you quit without your doctor's  approval.  Schedule and go to doctor visits as told.  Nonpregnant women should have no more than 1 drink a day. Men should have no more than 2 drinks a day. Talk to your doctor about drinking alcohol.  Stop illegal drug use.  Stay current with shots (immunizations).  Manage your health conditions as told by your doctor.  Learn to manage your stress.  Rest when you are tired.  If it is really hot outside:  Avoid intense activities.  Use air conditioning or fans, or get in a cooler place.  Avoid caffeine and alcohol.  Wear loose-fitting, lightweight, and light-colored clothing.  If it is really cold outside:  Avoid intense activities.  Layer your clothing.  Wear mittens or gloves, a hat, and a scarf when going outside.  Avoid alcohol.  Learn about heart failure and get support as needed.  Get help to maintain or improve your quality of life and your ability to care for yourself as needed. GET HELP IF:   You gain weight quickly.  You are more short of breath than usual.  You cannot do your normal activities.  You tire easily.  You cough more than normal, especially with activity.  You have any or more puffiness (swelling) in areas such as your hands, feet, ankles, or belly (abdomen).  You cannot sleep because it is hard to breathe.  You feel like your heart is beating fast (palpitations).  You get dizzy or light-headed when you stand up. GET HELP  RIGHT AWAY IF:   You have trouble breathing.  There is a change in mental status, such as becoming less alert or not being able to focus.  You have chest pain or discomfort.  You faint. MAKE SURE YOU:   Understand these instructions.  Will watch your condition.  Will get help right away if you are not doing well or get worse.   This information is not intended to replace advice given to you by your health care provider. Make sure you discuss any questions you have with your health care provider.    Document Released: 10/30/2007 Document Revised: 02/10/2014 Document Reviewed: 03/08/2012 Elsevier Interactive Patient Education [employer].

## 2014-11-29 ENCOUNTER — Encounter: Payer: Self-pay | Admitting: Family Medicine

## 2014-11-29 LAB — BASIC METABOLIC PANEL WITH GFR
BUN: 63 mg/dL — ABNORMAL HIGH (ref 7–25)
CALCIUM: 8.5 mg/dL — AB (ref 8.6–10.3)
CO2: 21 mmol/L (ref 20–31)
Chloride: 101 mmol/L (ref 98–110)
Creat: 2.17 mg/dL — ABNORMAL HIGH (ref 0.70–1.25)
GFR, EST AFRICAN AMERICAN: 36 mL/min — AB (ref >=60)
GFR, EST NON AFRICAN AMERICAN: 31 mL/min — AB (ref >=60)
Glucose, Bld: 263 mg/dL — ABNORMAL HIGH (ref 65–99)
Potassium: 4.3 mmol/L (ref 3.5–5.3)
SODIUM: 137 mmol/L (ref 135–146)

## 2014-11-30 NOTE — Telephone Encounter (Signed)
Paperwork completed and placed to be mailed.   Rosemarie Ax, MD PGY-3, [facility] 11/30/2014, 2:08 PM

## 2014-12-01 ENCOUNTER — Other Ambulatory Visit (HOSPITAL_COMMUNITY): Payer: Medicaid Other

## 2014-12-05 ENCOUNTER — Other Ambulatory Visit: Payer: Self-pay

## 2014-12-05 ENCOUNTER — Ambulatory Visit (INDEPENDENT_AMBULATORY_CARE_PROVIDER_SITE_OTHER): Payer: Medicaid Other | Admitting: Physician Assistant

## 2014-12-05 ENCOUNTER — Other Ambulatory Visit (HOSPITAL_COMMUNITY): Payer: Medicaid Other

## 2014-12-05 ENCOUNTER — Encounter: Payer: Self-pay | Admitting: Physician Assistant

## 2014-12-05 ENCOUNTER — Ambulatory Visit (HOSPITAL_COMMUNITY): Payer: Medicaid Other | Attending: Cardiovascular Disease

## 2014-12-05 VITALS — BP 150/60 | HR 68 | Ht 68.0 in | Wt 237.0 lb

## 2014-12-05 DIAGNOSIS — Z951 Presence of aortocoronary bypass graft: Secondary | ICD-10-CM | POA: Diagnosis not present

## 2014-12-05 DIAGNOSIS — I2510 Atherosclerotic heart disease of native coronary artery without angina pectoris: Secondary | ICD-10-CM | POA: Diagnosis not present

## 2014-12-05 DIAGNOSIS — E785 Hyperlipidemia, unspecified: Secondary | ICD-10-CM | POA: Insufficient documentation

## 2014-12-05 DIAGNOSIS — I10 Essential (primary) hypertension: Secondary | ICD-10-CM | POA: Diagnosis not present

## 2014-12-05 DIAGNOSIS — I5033 Acute on chronic diastolic (congestive) heart failure: Secondary | ICD-10-CM | POA: Diagnosis not present

## 2014-12-05 DIAGNOSIS — I517 Cardiomegaly: Secondary | ICD-10-CM | POA: Diagnosis not present

## 2014-12-05 DIAGNOSIS — I5032 Chronic diastolic (congestive) heart failure: Secondary | ICD-10-CM

## 2014-12-05 DIAGNOSIS — Z87891 Personal history of nicotine dependence: Secondary | ICD-10-CM | POA: Insufficient documentation

## 2014-12-05 DIAGNOSIS — E119 Type 2 diabetes mellitus without complications: Secondary | ICD-10-CM | POA: Diagnosis not present

## 2014-12-05 DIAGNOSIS — I739 Peripheral vascular disease, unspecified: Secondary | ICD-10-CM | POA: Diagnosis not present

## 2014-12-05 DIAGNOSIS — I509 Heart failure, unspecified: Secondary | ICD-10-CM | POA: Diagnosis not present

## 2014-12-05 MED ORDER — LISINOPRIL 40 MG PO TABS: 20.0000 mg | ORAL_TABLET | Freq: Every day | ORAL | Status: DC

## 2014-12-05 NOTE — Assessment & Plan Note (Signed)
Stable without chest pain 

## 2014-12-05 NOTE — Assessment & Plan Note (Signed)
Patient is  no longer on oxygen.

## 2014-12-05 NOTE — Assessment & Plan Note (Signed)
Patient has chronic diastolic heart failure. 2-D echo today is reassuring but will be formally read. I Believe most of this stems from a high sodium diet. We'll resume lisinopril 20 mg once daily and spironolactone 50 mg daily and continue Lasix at 60mg  daily . Follow-up renal function next week.

## 2014-12-05 NOTE — Progress Notes (Signed)
Cardiology Office Note   Date:  12/05/2014   ID:  Leonard [name], DOB 1950/08/15, MRN [id number]  PCP:  Clearance Coots, MD  Cardiologist:  Dr. Mare Ferrari  Chief Complaint: shortness of breath    History of Present Illness: Leonard [name] is a 64 y.o. male who presents for follow-up of acute on chronic diastolic heart failure. He is referred by primary care.  HisLasix was increased to 120 mg  daily and creatinine went up to 2.34. 2.8 and then is back down to 2.17 on 11/28/14. Because of this his lisinopril and spironolactone were held and 2-D echo ordered.   Patient has known ischemic heart disease. He underwent coronary artery bypass graft surgery 3 on 04/07/13 I Dr. Prescott Gum. Patient also has a history of peripheral arterial occlusive disease. He had a prior stent to his right femoral artery in December 2014. He has a history of diabetes and a history of hypertension. He has had previous admissions for exacerbation of heart failure. His most recent echocardiogram on 12/09/13 showed normal systolic function with ejection fraction of 60-65% and mild LVH and there was grade 2 diastolic dysfunction. The patient has mild renal insufficiency. In January 2016 his creatinine was 1.18 and his BUN was 32.  She was last seen by Dr. Mare Ferrari 07/12/14 which time he was on home oxygen at 2 L around-the-clock. He remained on Lasix 60 mg daily. He had a trace of ankle edema consistent with his amlodipine. No changes were made.  Patient is no longer on oxygen. He admits to getting a lot of salt in his diet when he had trouble with swelling. Overall is feeling better with only mild edema. He lives with his sister and has trouble staying away from potato chips and fast food. They brought [employer] and form yesterday. Preliminary 2-D echo is reviewed and shows normal LV function with LVH and diastolic dysfunction.  Past Medical History  Diagnosis Date  . Hypertension   . PAD (peripheral artery disease)  ([place])   . Gangrene of toe ([place])     right 5th/notes 01/04/2013   . High cholesterol   . Type II diabetes mellitus [facility])     Past Surgical History  Procedure Laterality Date  . Throat surgery  1983    polyps  . Angioplasty / stenting femoral Right 01/13/2013    superficial femoral artery x 2 (3 mm x 60 mm, 4 mm x 60 mm)  . Tonsillectomy and adenoidectomy  ~ 1965  . Amputation Right 01/28/2013    Procedure: RAY AMPUTATION RIGHT  4th & 5th TOE;  Surgeon: Rosetta Posner, MD;  Location: [facility] OR;  Service: Vascular;  Laterality: Right;  . Coronary artery bypass graft N/A 04/07/2013    Procedure: CORONARY ARTERY BYPASS GRAFTING (CABG);  Surgeon: Ivin Poot, MD;  Location: [facility];  Service: Open Heart Surgery;  Laterality: N/A;  . Intraoperative transesophageal echocardiogram N/A 04/07/2013    Procedure: INTRAOPERATIVE TRANSESOPHAGEAL ECHOCARDIOGRAM;  Surgeon: Ivin Poot, MD;  Location: [facility];  Service: Open Heart Surgery;  Laterality: N/A;  . Lower extremity angiogram Bilateral 01/06/2013    Procedure: LOWER EXTREMITY ANGIOGRAM;  Surgeon: Conrad Crandall, MD;  Location: [facility] CATH LAB;  Service: Cardiovascular;  Laterality: Bilateral;  . Lower extremity angiogram Right 01/13/2013    Procedure: LOWER EXTREMITY ANGIOGRAM;  Surgeon: Conrad Guayanilla, MD;  Location: [facility] CATH LAB;  Service: Cardiovascular;  Laterality: Right;  . Left heart catheterization with coronary angiogram N/A 04/05/2013  Procedure: LEFT HEART CATHETERIZATION WITH CORONARY ANGIOGRAM;  Surgeon: Peter M Martinique, MD;  Location: [facility] CATH LAB;  Service: Cardiovascular;  Laterality: N/A;     Current Outpatient Prescriptions  Medication Sig Dispense Refill  . ACCU-CHEK FASTCLIX LANCETS MISC 40 Units by Does not apply route 2 (two) times daily. 1 each 5  . amLODipine (NORVASC) 10 MG tablet Take 1 tablet (10 mg total) by mouth daily. 90 tablet 1  . aspirin 81 MG tablet Take 1 tablet (81 mg total) by mouth daily.    Marland Kitchen atorvastatin (LIPITOR) 40  MG tablet TAKE 1 TABLET BY MOUTH EVERY DAY AT 6 PM 90 tablet 1  . B-D ULTRAFINE III SHORT PEN 31G X 8 MM MISC USE WITH INSULIN PEN TWICE DAILY 100 each 0  . Blood Pressure Monitoring (ADULT BLOOD PRESSURE CUFF LG) KIT 1 Device by Does not apply route daily. 1 each 0  . carvedilol (COREG) 25 MG tablet TAKE 2 TABLETS BY MOUTH TWICE DAILY WITH A MEAL 60 tablet 3  . cloNIDine (CATAPRES) 0.2 MG tablet Take 1 tablet (0.2 mg total) by mouth 2 (two) times daily. 60 tablet 6  . furosemide (LASIX) 40 MG tablet 1 and 1/2 tablet daily 45 tablet 5  . glipiZIDE (GLUCOTROL) 10 MG tablet Take 1 tablet (10 mg total) by mouth 2 (two) times daily before a meal. 120 tablet 1  . glucose blood (ACCU-CHEK AVIVA) test strip Use as instructed. QS 3 times a day testing. 100 each 12  . hydrochlorothiazide (HYDRODIURIL) 25 MG tablet Take 0.5 tablets (12.5 mg total) by mouth daily. 90 tablet 1  . Lancet Devices (ACCU-CHEK SOFTCLIX) lancets Use as instructed to test 4 times a day.  Dx code 250.00.  May change to fit patients meter 100 each 12  . LANTUS SOLOSTAR 100 UNIT/ML Solostar Pen ADMINISTER 40 UNITS UNDER THE SKIN TWICE DAILY 15 mL 5  . lisinopril (PRINIVIL,ZESTRIL) 40 MG tablet Take 0.5 tablets (20 mg total) by mouth daily.    Marland Kitchen spironolactone (ALDACTONE) 50 MG tablet Take 1 tablet (50 mg total) by mouth daily. 90 tablet 1   No current facility-administered medications for this visit.    Allergies:   Review of patient's allergies indicates no known allergies.    Social History:  The patient  reports that he quit smoking about 22 months ago. His smoking use included Cigarettes. He started smoking about 47 years ago. He has a 46 pack-year smoking history. He has never used smokeless tobacco. He reports that he does not drink alcohol or use illicit drugs.   Family History:  The patient's family history includes Cancer in his mother and sister; Heart attack in his sister; Hypertension in his mother and sister. There is  no history of Stroke.    ROS:  Please see the history of present illness.   Otherwise, review of systems are positive for none.   All other systems are reviewed and negative.    PHYSICAL EXAM: VS:  BP 150/60 mmHg  Pulse 68  Ht _0  (1.727 m)  Wt 237 lb (107.502 kg)  BMI 36.04 kg/m2  SpO2 92% , BMI Body mass index is 36.04 kg/(m^2). GEN: Well nourished, well developed, in no acute distress Neck: no JVD, HJR, carotid bruits, or masses Cardiac: RRR; positive S4, 2/6 systolic murmur at the left sternal border, no rubs, thrill or heave,  Respiratory: Decreased breath sounds throughout  GI: soft, nontender, nondistended, + BS MS: no deformity or atrophy Extremities: plus  1 edema without cyanosis, clubbing, edema, good distal pulses bilaterally.  Skin: warm and dry, no rash Neuro:  Strength and sensation are intact    EKG:  EKG is not ordered today.    Recent Labs: 12/08/2013: Pro B Natriuretic peptide (BNP) 2795.0* 12/09/2013: Magnesium 1.7 02/10/2014: ALT 30; B Natriuretic Peptide 246.9* 02/13/2014: Hemoglobin 8.8*; Platelets 147* 11/21/2014: TSH 9.673* 11/28/2014: BUN 63*; Creat 2.17*; Potassium 4.3; Sodium 137    Lipid Panel    Component Value Date/Time   CHOL 147 11/25/2012 1546   TRIG 400* 11/25/2012 1546   HDL 27* 11/25/2012 1546   CHOLHDL 5.4 11/25/2012 1546   VLDL 80* 11/25/2012 1546   LDLCALC 40 11/25/2012 1546      Wt Readings from Last 3 Encounters:  12/05/14 237 lb (107.502 kg)  11/28/14 237 lb 12.8 oz (107.865 kg)  11/25/14 236 lb (107.049 kg)      Other studies Reviewed: Additional studies/ records that were reviewed today include and review of the records demonstrates:  Labs reviewed the past 6 monnths Study Conclusions  - Left ventricle: The cavity size was normal. Wall thickness was   increased in a pattern of mild LVH. Systolic function was normal.   The estimated ejection fraction was in the range of 60% to 65%.   Wall motion was normal; there  were no regional wall motion   abnormalities. Features are consistent with a pseudonormal left   ventricular filling pattern, with concomitant abnormal relaxation   and increased filling pressure (grade 2 diastolic dysfunction). - Left atrium: The atrium was mildly dilated.  ASSESSMENT AND PLAN:  Chronic diastolic heart failure Patient has chronic diastolic heart failure. 2-D echo today is reassuring but will be formally read. I Believe most of this stems from a high sodium diet. We'll resume lisinopril 20 mg once daily and spironolactone 50 mg daily and continue Lasix at 73m daily . Follow-up renal function next week.  Essential hypertension, benign BP is up. Resume low-dose lisinopril and spironolactone.  COPD (chronic obstructive pulmonary disease) Patient is  no longer on oxygen.  CAD (coronary artery disease) Stable without chest pain    Signed, MErmalinda Barrios PA-C  12/05/2014 12:40 PM    [facility] [address] [place]  [id number]: [phone] Fax: [phone]

## 2014-12-05 NOTE — Assessment & Plan Note (Signed)
BP is up. Resume low-dose lisinopril and spironolactone.

## 2014-12-05 NOTE — Patient Instructions (Signed)
Medication Instructions:  Your physician has recommended you make the following change in your medication:  1- Decrease lisinopril 20 mg (1/2 tablet ) by mouth daily 2- START spirolactone 50 mg by mouth daily   Labwork: Your physician recommends that you return for lab work in: 1 week BMET   Testing/Procedures: NONE ordered today  Follow-Up: Your physician recommends that you schedule a follow-up appointment in: 1 month with Dr. Mare Ferrari.         If you need a refill on your cardiac medications before your next appointment, please call your pharmacy.  Low-Sodium Eating Plan Sodium raises blood pressure and causes water to be held in the body. Getting less sodium from food will help lower your blood pressure, reduce any swelling, and protect your heart, liver, and kidneys. We get sodium by adding salt (sodium chloride) to food. Most of our sodium comes from canned, boxed, and frozen foods. Restaurant foods, fast foods, and pizza are also very high in sodium. Even if you take medicine to lower your blood pressure or to reduce fluid in your body, getting less sodium from your food is important. WHAT IS MY PLAN? Most people should limit their sodium intake to 2,300 mg a day. Your health care provider recommends that you limit your sodium intake to __________ a day.  WHAT DO I NEED TO KNOW ABOUT THIS EATING PLAN? For the low-sodium eating plan, you will follow these general guidelines:  Choose foods with a % Daily Value for sodium of less than 5% (as listed on the food label).   Use salt-free seasonings or herbs instead of table salt or sea salt.   Check with your health care provider or pharmacist before using salt substitutes.   Eat fresh foods.  Eat more vegetables and fruits.  Limit canned vegetables. If you do use them, rinse them well to decrease the sodium.   Limit cheese to 1 oz (28 g) per day.   Eat lower-sodium products, often labeled as "lower sodium" or "no  salt added."  Avoid foods that contain monosodium glutamate (MSG). MSG is sometimes added to [place] food and some canned foods.  Check food labels (Nutrition Facts labels) on foods to learn how much sodium is in one serving.  Eat more home-cooked food and less restaurant, buffet, and fast food.  When eating at a restaurant, ask that your food be prepared with less salt, or no salt if possible.  HOW DO I READ FOOD LABELS FOR SODIUM INFORMATION? The Nutrition Facts label lists the amount of sodium in one serving of the food. If you eat more than one serving, you must multiply the listed amount of sodium by the number of servings. Food labels may also identify foods as:  Sodium free--Less than 5 mg in a serving.  Very low sodium--35 mg or less in a serving.  Low sodium--140 mg or less in a serving.  Light in sodium--50% less sodium in a serving. For example, if a food that usually has 300 mg of sodium is changed to become light in sodium, it will have 150 mg of sodium.  Reduced sodium--25% less sodium in a serving. For example, if a food that usually has 400 mg of sodium is changed to reduced sodium, it will have 300 mg of sodium. WHAT FOODS CAN I EAT? Grains Low-sodium cereals, including oats, puffed wheat and rice, and shredded wheat cereals. Low-sodium crackers. Unsalted rice and pasta. Lower-sodium bread.  Vegetables Frozen or fresh vegetables. Low-sodium or reduced-sodium  canned vegetables. Low-sodium or reduced-sodium tomato sauce and paste. Low-sodium or reduced-sodium tomato and vegetable juices.  Fruits Fresh, frozen, and canned fruit. Fruit juice.  Meat and Other Protein Products Low-sodium canned tuna and salmon. Fresh or frozen meat, poultry, seafood, and fish. Lamb. Unsalted nuts. Dried beans, peas, and lentils without added salt. Unsalted canned beans. Homemade soups without salt. Eggs.  Dairy Milk. Soy milk. Ricotta cheese. Low-sodium or reduced-sodium cheeses.  Yogurt.  Condiments Fresh and dried herbs and spices. Salt-free seasonings. Onion and garlic powders. Low-sodium varieties of mustard and ketchup. Fresh or refrigerated horseradish. Lemon juice.  Fats and Oils Reduced-sodium salad dressings. Unsalted butter.  Other Unsalted popcorn and pretzels.  The items listed above may not be a complete list of recommended foods or beverages. Contact your dietitian for more options. WHAT FOODS ARE NOT RECOMMENDED? Grains Instant hot cereals. Bread stuffing, pancake, and biscuit mixes. Croutons. Seasoned rice or pasta mixes. Noodle soup cups. Boxed or frozen macaroni and cheese. Self-rising flour. Regular salted crackers. Vegetables Regular canned vegetables. Regular canned tomato sauce and paste. Regular tomato and vegetable juices. Frozen vegetables in sauces. Salted [place] fries. Olives. Angie Fava. Relishes. Sauerkraut. Salsa. Meat and Other Protein Products Salted, canned, smoked, spiced, or pickled meats, seafood, or fish. Bacon, ham, sausage, hot dogs, corned beef, chipped beef, and packaged luncheon meats. Salt pork. Jerky. Pickled herring. Anchovies, regular canned tuna, and sardines. Salted nuts. Dairy Processed cheese and cheese spreads. Cheese curds. Blue cheese and cottage cheese. Buttermilk.  Condiments Onion and garlic salt, seasoned salt, table salt, and sea salt. Canned and packaged gravies. Worcestershire sauce. Tartar sauce. Barbecue sauce. Teriyaki sauce. Soy sauce, including reduced sodium. Steak sauce. Fish sauce. Oyster sauce. Cocktail sauce. Horseradish that you find on the shelf. Regular ketchup and mustard. Meat flavorings and tenderizers. Bouillon cubes. Hot sauce. Tabasco sauce. Marinades. Taco seasonings. Relishes. Fats and Oils Regular salad dressings. Salted butter. Margarine. Ghee. Bacon fat.  Other Potato and tortilla chips. Corn chips and puffs. Salted popcorn and pretzels. Canned or dried soups. Pizza. Frozen  entrees and pot pies.  The items listed above may not be a complete list of foods and beverages to avoid. Contact your dietitian for more information.   This information is not intended to replace advice given to you by your health care provider. Make sure you discuss any questions you have with your health care provider.   Document Released: 07/12/2001 Document Revised: 02/10/2014 Document Reviewed: 11/24/2012 Elsevier Interactive Patient Education [employer].

## 2014-12-11 ENCOUNTER — Other Ambulatory Visit: Payer: Self-pay | Admitting: Family Medicine

## 2014-12-12 ENCOUNTER — Other Ambulatory Visit (INDEPENDENT_AMBULATORY_CARE_PROVIDER_SITE_OTHER): Payer: Medicaid Other | Admitting: *Deleted

## 2014-12-12 DIAGNOSIS — I10 Essential (primary) hypertension: Secondary | ICD-10-CM | POA: Diagnosis not present

## 2014-12-13 LAB — BASIC METABOLIC PANEL
BUN: 54 mg/dL — AB (ref 7–25)
CHLORIDE: 106 mmol/L (ref 98–110)
CO2: 18 mmol/L — ABNORMAL LOW (ref 20–31)
Calcium: 8.8 mg/dL (ref 8.6–10.3)
Creat: 2.27 mg/dL — ABNORMAL HIGH (ref 0.70–1.25)
GLUCOSE: 123 mg/dL — AB (ref 65–99)
POTASSIUM: 3.9 mmol/L (ref 3.5–5.3)
Sodium: 136 mmol/L (ref 135–146)

## 2014-12-13 NOTE — Progress Notes (Signed)
Quick Note:  Please report to patient. The recent labs are stable. Continue same medication and careful diet. ______ 

## 2014-12-15 ENCOUNTER — Encounter: Payer: Self-pay | Admitting: Family Medicine

## 2014-12-15 ENCOUNTER — Ambulatory Visit (INDEPENDENT_AMBULATORY_CARE_PROVIDER_SITE_OTHER): Payer: Medicaid Other | Admitting: Family Medicine

## 2014-12-15 VITALS — BP 172/62 | HR 61 | Temp 97.9°F | Wt 234.6 lb

## 2014-12-15 DIAGNOSIS — E118 Type 2 diabetes mellitus with unspecified complications: Secondary | ICD-10-CM | POA: Diagnosis not present

## 2014-12-15 DIAGNOSIS — I5032 Chronic diastolic (congestive) heart failure: Secondary | ICD-10-CM | POA: Diagnosis not present

## 2014-12-15 MED ORDER — ZOSTER VACCINE LIVE 19400 UNT/0.65ML ~~LOC~~ SOLR: 0.6500 mL | Freq: Once | SUBCUTANEOUS | Status: DC

## 2014-12-15 NOTE — Progress Notes (Signed)
Subjective:    Leonard [name] - 64 y.o. male MRN [id number]  Date of birth: 04-Apr-1950  HPI  Leonard [name] is here for HF f/u.   Heart failure:  No having sob Weight is baseline  No orthopnea, PND Edema has improved.   DM2:  His diabetic shoes were not approved.  Given paperwork for denial of prior approval.  He has previously been approved for diabetic shoes and those shoes wore.   Health Maintenance:  Health Maintenance Due  Topic Date Due  . COLONOSCOPY  06/21/2001  . ZOSTAVAX  06/22/2011    -  reports that he quit smoking about 22 months ago. His smoking use included Cigarettes. He started smoking about 47 years ago. He has a 46 pack-year smoking history. He has never used smokeless tobacco. - Review of Systems: Per HPI. - Past Medical History: Patient Active Problem List   Diagnosis Date Noted  . Hypothyroidism 11/22/2014  . Health care maintenance 11/22/2014  . Acute on chronic diastolic heart failure ([place]) 07/04/2014  . Tooth pain 05/11/2014  . Chronic diastolic heart failure ([place]) 03/16/2014  . COPD (chronic obstructive pulmonary disease) ([place]) 02/16/2014  . Diastolic heart failure ([place])   . Bilateral lower extremity edema 10/14/2013  . History of tobacco use 08/23/2013  . 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 CAD (coronary artery disease) 04/06/2013  . NSTEMI (non-ST elevated myocardial infarction) ([place]) 04/05/2013  . PVD - hx of Rt SFA PTA and s/p Rt 4-5th toe amp Dec 2014 04/05/2013  . Atherosclerosis of native arteries of the extremities with gangrene ([place]) 02/15/2013  . Normocytic anemia 01/26/2013  . Essential hypertension, benign 11/25/2012  . Diabetes mellitus, type 2 ([place]) 01/06/2012  . Mixed hyperlipidemia 07/17/2008   - Medications: reviewed and updated Current Outpatient Prescriptions  Medication Sig Dispense Refill  . ACCU-CHEK FASTCLIX LANCETS MISC 40 Units by Does not apply route 2 (two) times daily. 1 each 5  . amLODipine (NORVASC) 10 MG  tablet Take 1 tablet (10 mg total) by mouth daily. 90 tablet 1  . aspirin 81 MG tablet Take 1 tablet (81 mg total) by mouth daily.    Marland Kitchen atorvastatin (LIPITOR) 40 MG tablet TAKE 1 TABLET BY MOUTH EVERY DAY AT 6 PM 90 tablet 1  . B-D ULTRAFINE III SHORT PEN 31G X 8 MM MISC USE WITH INSULIN PEN TWICE DAILY 100 each 0  . Blood Pressure Monitoring (ADULT BLOOD PRESSURE CUFF LG) KIT 1 Device by Does not apply route daily. 1 each 0  . carvedilol (COREG) 25 MG tablet TAKE 2 TABLETS BY MOUTH TWICE DAILY WITH A MEAL 60 tablet 3  . cloNIDine (CATAPRES) 0.2 MG tablet Take 1 tablet (0.2 mg total) by mouth 2 (two) times daily. 60 tablet 6  . furosemide (LASIX) 40 MG tablet 1 and 1/2 tablet daily 45 tablet 5  . glipiZIDE (GLUCOTROL) 10 MG tablet Take 1 tablet (10 mg total) by mouth 2 (two) times daily before a meal. 120 tablet 1  . glucose blood (ACCU-CHEK AVIVA) test strip Use as instructed. QS 3 times a day testing. 100 each 12  . hydrochlorothiazide (HYDRODIURIL) 25 MG tablet Take 0.5 tablets (12.5 mg total) by mouth daily. 90 tablet 1  . Lancet Devices (ACCU-CHEK SOFTCLIX) lancets Use as instructed to test 4 times a day.  Dx code 250.00.  May change to fit patients meter 100 each 12  . LANTUS SOLOSTAR 100 UNIT/ML Solostar Pen INJECT 40 UNITS INTO THE SKIN  TWICE DAILY 15 mL 0  . lisinopril (PRINIVIL,ZESTRIL) 40 MG tablet Take 0.5 tablets (20 mg total) by mouth daily.    Marland Kitchen spironolactone (ALDACTONE) 50 MG tablet Take 1 tablet (50 mg total) by mouth daily. 90 tablet 1  . zoster vaccine live, PF, (ZOSTAVAX) 15183 UNT/0.65ML injection Inject 19,400 Units into the skin once. 1 each 0   No current facility-administered medications for this visit.     Review of Systems See HPI     Objective:   Physical Exam BP 172/62 mmHg  Pulse 61  Temp(Src) 97.9 F (36.6 C) (Oral)  Wt 234 lb 9.6 oz (106.414 kg)  SpO2 100% Gen: NAD, alert, cooperative with exam,  CV: RRR, good S1/S2, no murmur, trace to +1 edema,    Resp: CTABL, no wheezes, non-labored,  Skin: no rashes, normal turgor  Neuro: no gross deficits.    Assessment & Plan:   Chronic diastolic heart failure (HCC) ECHO is reassuring.  Weight and edema have improved.  He tends to eat several foods that are high in salt. Most likely the reason for his repeated exacerbations.  - continue medications.  - f/u 3 months.   Diabetes mellitus, type 2 Diabetic shoes were not approved.  - call [employer] and re-apply for shoes.

## 2014-12-15 NOTE — Assessment & Plan Note (Signed)
ECHO is reassuring.  Weight and edema have improved.  He tends to eat several foods that are high in salt. Most likely the reason for his repeated exacerbations.  - continue medications.  - f/u 3 months.

## 2014-12-15 NOTE — Assessment & Plan Note (Signed)
Diabetic shoes were not approved.  - call [employer] and re-apply for shoes.

## 2014-12-15 NOTE — Patient Instructions (Signed)
Thank you for coming in,   I have made a referral to the kidney doctor.   I will call about your diabetic shoes.   Please bring all of your medications with you to each visit.   Sign up for My Chart to have easy access to your labs results, and communication with your Primary care physician   Please feel free to call with any questions or concerns at any time, at [phone]. --Dr. Raeford Razor

## 2014-12-18 ENCOUNTER — Other Ambulatory Visit: Payer: Self-pay | Admitting: Family Medicine

## 2014-12-19 ENCOUNTER — Ambulatory Visit (INDEPENDENT_AMBULATORY_CARE_PROVIDER_SITE_OTHER): Payer: Medicaid Other | Admitting: Podiatry

## 2014-12-19 ENCOUNTER — Encounter: Payer: Self-pay | Admitting: Podiatry

## 2014-12-19 DIAGNOSIS — I739 Peripheral vascular disease, unspecified: Secondary | ICD-10-CM

## 2014-12-19 DIAGNOSIS — B351 Tinea unguium: Secondary | ICD-10-CM

## 2014-12-19 DIAGNOSIS — M79676 Pain in unspecified toe(s): Secondary | ICD-10-CM | POA: Diagnosis not present

## 2014-12-19 NOTE — Progress Notes (Signed)
Patient ID: Leonard [name], male   DOB: [birth year], 63 y.o.   MRN: [id number] Complaint:  Visit Type: Patient returns to my office for continued preventative foot care services. Complaint: Patient states" my nails have grown long and thick and become painful to walk and wear shoes" Patient has been diagnosed with DM with amputation 4,5 rays right foot... The patient presents for preventative foot care services. No changes to ROS  Podiatric Exam: Vascular: dorsalis pedis and posterior tibial pulses are not  palpable bilateral. Capillary return is immediate.  Cold feet Absence of hair.  Sensorium: Absent  Semmes Weinstein monofilament test. Normal tactile sensation bilaterally. Nail Exam: Pt has thick disfigured discolored nails with subungual debris noted bilateral entire nail hallux through fifth toenails Ulcer Exam: There is no evidence of ulcer or pre-ulcerative changes or infection. Orthopedic Exam: Muscle tone and strength are WNL. No limitations in general ROM. No crepitus or effusions noted. Foot type and digits show no abnormalities. Bony prominences are unremarkable. Skin: No Porokeratosis. No infection or ulcers  Diagnosis:  Onychomycosis, , Pain in right toe, pain in left toes  Treatment & Plan Procedures and Treatment: Consent by patient was obtained for treatment procedures. The patient understood the discussion of treatment and procedures well. All questions were answered thoroughly reviewed. Debridement of mycotic and hypertrophic toenails, 1 through 5 bilateral and clearing of subungual debris. No ulceration, no infection noted. Discussed his failure to obtain shoes from [employer].  Melody talked with him about his problem. Return Visit-Office Procedure: Patient instructed to return to the office for a follow up visit 3 months for continued evaluation and treatment.    Gardiner Barefoot DPM

## 2014-12-27 ENCOUNTER — Other Ambulatory Visit: Payer: Self-pay | Admitting: Family Medicine

## 2015-01-04 ENCOUNTER — Other Ambulatory Visit: Payer: Self-pay | Admitting: Family Medicine

## 2015-01-10 ENCOUNTER — Telehealth: Payer: Self-pay | Admitting: Hematology and Oncology

## 2015-01-10 NOTE — Telephone Encounter (Signed)
S/W PT IN REF TO NP APPT ON 01/15/15@11 :00 REFERRING DR COLADONATO DX-MGUS VS MYELOMA EVAL.

## 2015-01-15 ENCOUNTER — Ambulatory Visit (HOSPITAL_BASED_OUTPATIENT_CLINIC_OR_DEPARTMENT_OTHER): Payer: Medicaid Other | Admitting: Hematology and Oncology

## 2015-01-15 ENCOUNTER — Encounter: Payer: Self-pay | Admitting: Hematology and Oncology

## 2015-01-15 ENCOUNTER — Telehealth: Payer: Self-pay | Admitting: Hematology and Oncology

## 2015-01-15 ENCOUNTER — Ambulatory Visit (HOSPITAL_BASED_OUTPATIENT_CLINIC_OR_DEPARTMENT_OTHER): Payer: Medicaid Other

## 2015-01-15 VITALS — BP 189/68 | HR 70 | Temp 98.2°F | Resp 18 | Ht 68.0 in | Wt 238.7 lb

## 2015-01-15 DIAGNOSIS — D472 Monoclonal gammopathy: Secondary | ICD-10-CM | POA: Diagnosis not present

## 2015-01-15 DIAGNOSIS — E1129 Type 2 diabetes mellitus with other diabetic kidney complication: Secondary | ICD-10-CM | POA: Diagnosis not present

## 2015-01-15 DIAGNOSIS — D539 Nutritional anemia, unspecified: Secondary | ICD-10-CM | POA: Insufficient documentation

## 2015-01-15 DIAGNOSIS — Z801 Family history of malignant neoplasm of trachea, bronchus and lung: Secondary | ICD-10-CM

## 2015-01-15 DIAGNOSIS — D631 Anemia in chronic kidney disease: Secondary | ICD-10-CM

## 2015-01-15 DIAGNOSIS — I10 Essential (primary) hypertension: Secondary | ICD-10-CM

## 2015-01-15 DIAGNOSIS — N184 Chronic kidney disease, stage 4 (severe): Secondary | ICD-10-CM | POA: Insufficient documentation

## 2015-01-15 DIAGNOSIS — Z809 Family history of malignant neoplasm, unspecified: Secondary | ICD-10-CM

## 2015-01-15 DIAGNOSIS — I5030 Unspecified diastolic (congestive) heart failure: Secondary | ICD-10-CM | POA: Diagnosis not present

## 2015-01-15 DIAGNOSIS — N189 Chronic kidney disease, unspecified: Secondary | ICD-10-CM

## 2015-01-15 DIAGNOSIS — D649 Anemia, unspecified: Secondary | ICD-10-CM

## 2015-01-15 DIAGNOSIS — Z87891 Personal history of nicotine dependence: Secondary | ICD-10-CM

## 2015-01-15 DIAGNOSIS — E1121 Type 2 diabetes mellitus with diabetic nephropathy: Secondary | ICD-10-CM

## 2015-01-15 DIAGNOSIS — I5032 Chronic diastolic (congestive) heart failure: Secondary | ICD-10-CM

## 2015-01-15 HISTORY — DX: Chronic kidney disease, stage 4 (severe): N18.4

## 2015-01-15 HISTORY — DX: Monoclonal gammopathy: D47.2

## 2015-01-15 LAB — CBC & DIFF AND RETIC
BASO%: 0.3 % (ref 0.0–2.0)
Basophils Absolute: 0 10*3/uL (ref 0.0–0.1)
EOS%: 5.5 % (ref 0.0–7.0)
Eosinophils Absolute: 0.4 10*3/uL (ref 0.0–0.5)
HEMATOCRIT: 25.8 % — AB (ref 38.4–49.9)
HEMOGLOBIN: 8.2 g/dL — AB (ref 13.0–17.1)
Immature Retic Fract: 8.6 % (ref 3.00–10.60)
LYMPH#: 1.3 10*3/uL (ref 0.9–3.3)
LYMPH%: 19.6 % (ref 14.0–49.0)
MCH: 24.7 pg — ABNORMAL LOW (ref 27.2–33.4)
MCHC: 31.8 g/dL — ABNORMAL LOW (ref 32.0–36.0)
MCV: 77.7 fL — AB (ref 79.3–98.0)
MONO#: 0.5 10*3/uL (ref 0.1–0.9)
MONO%: 6.7 % (ref 0.0–14.0)
NEUT%: 67.9 % (ref 39.0–75.0)
NEUTROS ABS: 4.6 10*3/uL (ref 1.5–6.5)
Platelets: 161 10*3/uL (ref 140–400)
RBC: 3.32 10*6/uL — ABNORMAL LOW (ref 4.20–5.82)
RDW: 16.5 % — AB (ref 11.0–14.6)
RETIC %: 2.4 % — AB (ref 0.80–1.80)
RETIC CT ABS: 79.68 10*3/uL (ref 34.80–93.90)
WBC: 6.7 10*3/uL (ref 4.0–10.3)

## 2015-01-15 LAB — COMPREHENSIVE METABOLIC PANEL
ALBUMIN: 4.3 g/dL (ref 3.5–5.0)
ALK PHOS: 83 U/L (ref 40–150)
ALT: 13 U/L (ref 0–55)
ANION GAP: 12 meq/L — AB (ref 3–11)
AST: 13 U/L (ref 5–34)
BILIRUBIN TOTAL: 0.35 mg/dL (ref 0.20–1.20)
BUN: 50.4 mg/dL — ABNORMAL HIGH (ref 7.0–26.0)
CALCIUM: 9.7 mg/dL (ref 8.4–10.4)
CO2: 25 meq/L (ref 22–29)
CREATININE: 2.7 mg/dL — AB (ref 0.7–1.3)
Chloride: 101 mEq/L (ref 98–109)
EGFR: 28 mL/min/{1.73_m2} — AB (ref >90)
Glucose: 344 mg/dl — ABNORMAL HIGH (ref 70–140)
Potassium: 4.2 mEq/L (ref 3.5–5.1)
Sodium: 138 mEq/L (ref 136–145)
TOTAL PROTEIN: 7.3 g/dL (ref 6.4–8.3)

## 2015-01-15 LAB — HOLD TUBE, BLOOD BANK

## 2015-01-15 NOTE — Assessment & Plan Note (Signed)
His blood pressure is very high today but the patient claims it is because of whitecoat hypertension. He will continue medical management.

## 2015-01-15 NOTE — Progress Notes (Signed)
[place] NOTE  Patient Care Team: Rosemarie Ax, MD as PCP - [place], MD as Consulting Physician (Nephrology)  CHIEF COMPLAINTS/PURPOSE OF CONSULTATION:  Progressive anemia, acute on chronic renal failure and MGUS  HISTORY OF PRESENTING ILLNESS:  Leonard [name] 64 y.o. male is here because of progressive anemia, renal failure and MGUS. I reviewed reports from his nephrologist office. The patient have significant peripheral vascular disease, diabetes and hypertension. Early this year, he had baseline creatinine of around 1.3 and subsequently developed heart failure. He was placed on aggressive medication/regimen and subsequently was noted to have progressive renal failure and anemia. According to his blood work dated 01/03/2015, white blood cell count is at 6.6, hemoglobin 7.1, platelet count of 153, creatinine of 2.4, calcium of 8.9, MCV 76, iron saturation 15%, ferritin 180, serum vitamin B-12 of 360. Serum protein electrophoresis initially detected M spike but subsequently not quantifiable. Urine immunofixation showed Bence-Jones Proteinuria. Echocardiogram show preserved ejection fraction 55-60% but with evidence of diastolic heart failure. He denies history of abnormal bone pain or bone fracture. Patient denies history of recurrent infection or atypical infections such as shingles of meningitis. Denies chills, night sweats, anorexia or abnormal weight loss. He complained of fatigue and shortness of breath on minimal exertion. The patient denies any recent signs or symptoms of bleeding such as spontaneous hematuria or hematochezia. He has occasional epistaxis when he blows his nose. He does not have spontaneous epistaxis  MEDICAL HISTORY:  Past Medical History  Diagnosis Date  . Hypertension   . PAD (peripheral artery disease) ([place])   . Gangrene of toe ([place])     right 5th/notes 01/04/2013   . High cholesterol   . Type II diabetes  mellitus ([place])   . MGUS (monoclonal gammopathy of unknown significance) 01/15/2015    SURGICAL HISTORY: Past Surgical History  Procedure Laterality Date  . Throat surgery  1983    polyps  . Angioplasty / stenting femoral Right 01/13/2013    superficial femoral artery x 2 (3 mm x 60 mm, 4 mm x 60 mm)  . Tonsillectomy and adenoidectomy  ~ 1965  . Amputation Right 01/28/2013    Procedure: RAY AMPUTATION RIGHT  4th & 5th TOE;  Surgeon: Rosetta Posner, MD;  Location: [facility] OR;  Service: Vascular;  Laterality: Right;  . Coronary artery bypass graft N/A 04/07/2013    Procedure: CORONARY ARTERY BYPASS GRAFTING (CABG);  Surgeon: Ivin Poot, MD;  Location: [place];  Service: Open Heart Surgery;  Laterality: N/A;  . Intraoperative transesophageal echocardiogram N/A 04/07/2013    Procedure: INTRAOPERATIVE TRANSESOPHAGEAL ECHOCARDIOGRAM;  Surgeon: Ivin Poot, MD;  Location: [place];  Service: Open Heart Surgery;  Laterality: N/A;  . Lower extremity angiogram Bilateral 01/06/2013    Procedure: LOWER EXTREMITY ANGIOGRAM;  Surgeon: Conrad [place], MD;  Location: [facility] CATH LAB;  Service: Cardiovascular;  Laterality: Bilateral;  . Lower extremity angiogram Right 01/13/2013    Procedure: LOWER EXTREMITY ANGIOGRAM;  Surgeon: Conrad [place], MD;  Location: [facility] CATH LAB;  Service: Cardiovascular;  Laterality: Right;  . Left heart catheterization with coronary angiogram N/A 04/05/2013    Procedure: LEFT HEART CATHETERIZATION WITH CORONARY ANGIOGRAM;  Surgeon: Peter M Martinique, MD;  Location: [facility] CATH LAB;  Service: Cardiovascular;  Laterality: N/A;    SOCIAL HISTORY: Social History   Social History  . Marital Status: Single    Spouse Name: N/A  . Number of Children: N/A  . Years of Education:  N/A   Occupational History  . Not on file.   Social History Main Topics  . Smoking status: Former Smoker -- 1.00 packs/day for 46 years    Types: Cigarettes    Start date: 02/04/1967    Quit date: 02/03/2013  . Smokeless  tobacco: Never Used     Comment: Doing well with quitting.  . Alcohol Use: No  . Drug Use: No  . Sexual Activity: Not Currently   Other Topics Concern  . Not on file   Social History Narrative   Lives with sister, [name] in [place].  Retired - former Sports coach.    FAMILY HISTORY: Family History  Problem Relation Age of Onset  . Cancer Mother     lung cancer  . Hypertension Mother   . Cancer Sister     patient thinks it was uterine cancer  . Hypertension Sister   . Heart attack Sister   . Stroke Neg Hx     ALLERGIES:  has No Known Allergies.  MEDICATIONS:  Current Outpatient Prescriptions  Medication Sig Dispense Refill  . ACCU-CHEK FASTCLIX LANCETS MISC 40 Units by Does not apply route 2 (two) times daily. 1 each 5  . amLODipine (NORVASC) 10 MG tablet Take 1 tablet (10 mg total) by mouth daily. 90 tablet 1  . aspirin 81 MG tablet Take 1 tablet (81 mg total) by mouth daily.    Marland Kitchen atorvastatin (LIPITOR) 40 MG tablet TAKE 1 TABLET BY MOUTH EVERY DAY AT 6 PM 90 tablet 1  . B-D ULTRAFINE III SHORT PEN 31G X 8 MM MISC USE WITH INSULIN PEN TWICE DAILY 100 each 0  . Blood Pressure Monitoring (ADULT BLOOD PRESSURE CUFF LG) KIT 1 Device by Does not apply route daily. 1 each 0  . carvedilol (COREG) 25 MG tablet TAKE 2 TABLETS BY MOUTH TWICE DAILY WITH A MEAL 60 tablet 3  . cloNIDine (CATAPRES) 0.2 MG tablet Take 1 tablet (0.2 mg total) by mouth 2 (two) times daily. 60 tablet 6  . furosemide (LASIX) 40 MG tablet 1 and 1/2 tablet daily 45 tablet 5  . glipiZIDE (GLUCOTROL) 10 MG tablet Take 1 tablet (10 mg total) by mouth 2 (two) times daily before a meal. 120 tablet 1  . glucose blood (ACCU-CHEK AVIVA) test strip Use as instructed. QS 3 times a day testing. 100 each 12  . hydrochlorothiazide (HYDRODIURIL) 25 MG tablet Take 0.5 tablets (12.5 mg total) by mouth daily. 90 tablet 1  . Lancet Devices (ACCU-CHEK SOFTCLIX) lancets Use as instructed to test 4 times a day.  Dx code  250.00.  May change to fit patients meter 100 each 12  . LANTUS SOLOSTAR 100 UNIT/ML Solostar Pen INJECT 40 UNITS INTO THE SKIN TWICE DAILY 15 mL 0  . spironolactone (ALDACTONE) 50 MG tablet Take 1 tablet (50 mg total) by mouth daily. 90 tablet 1  . zoster vaccine live, PF, (ZOSTAVAX) 55732 UNT/0.65ML injection Inject 19,400 Units into the skin once. 1 each 0   No current facility-administered medications for this visit.    REVIEW OF SYSTEMS:   Eyes: Denies blurriness of vision, double vision or watery eyes Ears, nose, mouth, throat, and face: Denies mucositis or sore throat Cardiovascular: Denies palpitation, chest discomfort  Gastrointestinal:  Denies nausea, heartburn or change in bowel habits Skin: Denies abnormal skin rashes Lymphatics: Denies new lymphadenopathy or easy bruising Neurological:Denies numbness, tingling or new weaknesses Behavioral/Psych: Mood is stable, no new changes  All other systems were reviewed  with the patient and are negative.  PHYSICAL EXAMINATION: ECOG PERFORMANCE STATUS: 1 - Symptomatic but completely ambulatory  Filed Vitals:   01/15/15 1035  BP: 189/68  Pulse: 70  Temp: 98.2 F (36.8 C)  Resp: 18   Filed Weights   01/15/15 1035  Weight: 238 lb 11.2 oz (108.274 kg)    GENERAL:alert, no distress and comfortable. He is obese SKIN: skin color, texture, turgor are normal, no rashes or significant lesions EYES: normal, conjunctiva are pale and non-injected, sclera clear OROPHARYNX:no exudate, no erythema and lips, buccal mucosa, and tongue normal  NECK: supple, thyroid normal size, non-tender, without nodularity LYMPH:  no palpable lymphadenopathy in the cervical, axillary or inguinal LUNGS: clear to auscultation and percussion with normal breathing effort HEART: regular rate & rhythm with loud S2, mild bilateral lower extremity edema ABDOMEN:abdomen soft, non-tender and normal bowel sounds Musculoskeletal:no cyanosis of digits and no clubbing   PSYCH: alert & oriented x 3 with fluent speech NEURO: no focal motor/sensory deficits  LABORATORY DATA:  I have reviewed the data as listed Lab Results  Component Value Date   WBC 5.0 02/13/2014   HGB 8.8* 02/13/2014   HCT 28.0* 02/13/2014   MCV 81.9 02/13/2014   PLT 147* 02/13/2014    ASSESSMENT & PLAN:  MGUS (monoclonal gammopathy of unknown significance) According to the scanned blood work from his nephrologist office, M spike was detected one time and then subsequent repeat did not show any detectable M spike. Urine immunofixation detectable Bence Jones Proteinuria. It is not clear whether his anemia or renal failure is associated with monoclonal paraproteinemia. I will order additional workup today and see him back next week for further discussion.  DM (diabetes mellitus), type 2 with renal complications [facility]) The patient has type 2 diabetes with renal complications. His blood sugar is running consistently over 200. I will defer to his primary care physician for chronic diabetes management.  Essential hypertension, benign His blood pressure is very high today but the patient claims it is because of whitecoat hypertension. He will continue medical management.  Diastolic heart failure ([place]) He has mild peripheral edema. He will continue his diuretic therapy as directed.  CKD (chronic kidney disease) stage 4, GFR 15-29 ml/min (HCC) He had recent exacerbation of acute on chronic renal failure. His creatinine clearance based on the results from his nephrologist office put him at chronic kidney disease stage IV. He is currently undergoing workup. I suspect this is not from multiple myeloma but rather from uncontrolled risk factors such as diabetes and hypertension.  Anemia in chronic kidney disease -He has progressive renal failure. According to the results from the nephrologists office, it is not due to vitamin/nutritional deficiency. I suspect this is due to acute on  chronic renal failure. He would benefit from erythropoietin stimulating agent in the future. I will order additional workup. We also discussed about potential benefit from blood transfusion Repeat stat hemoglobin came back at greater than 8 g, we will not have to worry about blood transfusion today.     Orders Placed This Encounter  Procedures  . SPEP & IFE with QIG    Standing Status: Future     Number of Occurrences:      Standing Expiration Date: 02/19/2016  . Kappa/lambda light chains    Standing Status: Future     Number of Occurrences:      Standing Expiration Date: 02/19/2016  . Erythropoietin    Standing Status: Future     Number of  Occurrences:      Standing Expiration Date: 02/19/2016  . Comprehensive metabolic panel    Standing Status: Future     Number of Occurrences:      Standing Expiration Date: 02/19/2016  . CBC & Diff and Retic    Standing Status: Future     Number of Occurrences:      Standing Expiration Date: 02/19/2016  . Hold Tube, Blood Bank    Standing Status: Future     Number of Occurrences:      Standing Expiration Date: 02/19/2016    All questions were answered. The patient knows to call the clinic with any problems, questions or concerns. I spent 40 minutes counseling the patient face to face. The total time spent in the appointment was 55 minutes and more than 50% was on counseling.     [place], [place], MD 01/15/2015 11:27 AM

## 2015-01-15 NOTE — Assessment & Plan Note (Signed)
According to the scanned blood work from his nephrologist office, M spike was detected one time and then subsequent repeat did not show any detectable M spike. Urine immunofixation detectable Bence Jones Proteinuria. It is not clear whether his anemia or renal failure is associated with monoclonal paraproteinemia. I will order additional workup today and see him back next week for further discussion.

## 2015-01-15 NOTE — Assessment & Plan Note (Signed)
He had recent exacerbation of acute on chronic renal failure. His creatinine clearance based on the results from his nephrologist office put him at chronic kidney disease stage IV. He is currently undergoing workup. I suspect this is not from multiple myeloma but rather from uncontrolled risk factors such as diabetes and hypertension.

## 2015-01-15 NOTE — Assessment & Plan Note (Addendum)
-  He has progressive renal failure. According to the results from the nephrologists office, it is not due to vitamin/nutritional deficiency. I suspect this is due to acute on chronic renal failure. He would benefit from erythropoietin stimulating agent in the future. I will order additional workup. We also discussed about potential benefit from blood transfusion Repeat stat hemoglobin came back at greater than 8 g, we will not have to worry about blood transfusion today.

## 2015-01-15 NOTE — Telephone Encounter (Signed)
Gave and printed appts ched and avs for pt for DEC  °

## 2015-01-15 NOTE — Assessment & Plan Note (Signed)
He has mild peripheral edema. He will continue his diuretic therapy as directed.

## 2015-01-15 NOTE — Assessment & Plan Note (Signed)
The patient has type 2 diabetes with renal complications. His blood sugar is running consistently over 200. I will defer to his primary care physician for chronic diabetes management.

## 2015-01-17 LAB — SPEP & IFE WITH QIG
ALPHA-2-GLOBULIN: 0.6 g/dL (ref 0.5–0.9)
Albumin ELP: 4 g/dL (ref 3.8–4.8)
Alpha-1-Globulin: 0.4 g/dL — ABNORMAL HIGH (ref 0.2–0.3)
BETA GLOBULIN: 0.6 g/dL (ref 0.4–0.6)
Beta 2: 0.5 g/dL (ref 0.2–0.5)
Gamma Globulin: 0.8 g/dL (ref 0.8–1.7)
IgA: 465 mg/dL — ABNORMAL HIGH (ref 68–379)
IgG (Immunoglobin G), Serum: 979 mg/dL (ref 650–1600)
IgM, Serum: 119 mg/dL (ref 41–251)
TOTAL PROTEIN, SERUM ELECTROPHOR: 6.9 g/dL (ref 6.1–8.1)

## 2015-01-17 LAB — ERYTHROPOIETIN: Erythropoietin: 44.9 m[IU]/mL — ABNORMAL HIGH (ref 2.6–18.5)

## 2015-01-17 LAB — KAPPA/LAMBDA LIGHT CHAINS
Kappa free light chain: 8.11 mg/dL — ABNORMAL HIGH (ref 0.33–1.94)
Kappa:Lambda Ratio: 2.35 — ABNORMAL HIGH (ref 0.26–1.65)
LAMBDA FREE LGHT CHN: 3.45 mg/dL — AB (ref 0.57–2.63)

## 2015-01-19 ENCOUNTER — Ambulatory Visit (INDEPENDENT_AMBULATORY_CARE_PROVIDER_SITE_OTHER): Payer: Medicaid Other | Admitting: Cardiology

## 2015-01-19 ENCOUNTER — Encounter: Payer: Self-pay | Admitting: Cardiology

## 2015-01-19 VITALS — BP 190/80 | HR 68 | Ht 68.0 in | Wt 232.0 lb

## 2015-01-19 DIAGNOSIS — I5032 Chronic diastolic (congestive) heart failure: Secondary | ICD-10-CM | POA: Diagnosis not present

## 2015-01-19 DIAGNOSIS — Z951 Presence of aortocoronary bypass graft: Secondary | ICD-10-CM

## 2015-01-19 DIAGNOSIS — I10 Essential (primary) hypertension: Secondary | ICD-10-CM | POA: Diagnosis not present

## 2015-01-19 NOTE — Progress Notes (Signed)
Cardiology Office Note   Date:  01/19/2015   ID:  Leonard [name], DOB Apr 06, 1950, MRN [id number]  PCP:  Clearance Coots, MD  Cardiologist: Darlin Coco MD  No chief complaint on file.     History of Present Illness:  Leonard [name] is a 64 y.o. male who presents for follow-up office visit. He is a medical patient of Dr. Lacinda Axon of the [facility].  Patient has known ischemic heart disease. He underwent coronary artery bypass graft surgery 3 on 04/07/13 I Dr. Prescott Gum. Patient also has a history of peripheral arterial occlusive disease. He had a prior stent to his right femoral artery in December 2014. He has a history of diabetes and a history of hypertension. He has had previous admissions for exacerbation of heart failure. His most recent echocardiogram on 12/09/13 showed normal systolic function with ejection fraction of 60-65% and mild LVH and there was grade 2 diastolic dysfunction. The patient has mild renal insufficiency. He is now being followed by nephrology.  His spironolactone and his ACE inhibitor have been held. The patient has not been experiencing any recurrent chest pain or angina. He sleeps on one pillow. He denies any paroxysmal nocturnal dyspnea. The patient is on home oxygen at 2 L a minute around the clock. The patient has a history of chronic anemia. He was a previous heavy smoker. He quit smoking about a year ago. Since last visit he has not had any new cardiac symptoms.  He has only trace edema. He is trying to stay away from potato chips and sodas.  Nephrology has recommended a 1200 mL per day fluid intake He denies any chest pain or shortness of breath. He states that he has whitecoat hypertension. He states that at home his blood pressure is normal    Past Medical History  Diagnosis Date  . Hypertension   . PAD (peripheral artery disease) ([place])   . Gangrene of toe ([place])     right 5th/notes 01/04/2013   . High  cholesterol   . Type II diabetes mellitus ([place])   . MGUS (monoclonal gammopathy of unknown significance) 01/15/2015    Past Surgical History  Procedure Laterality Date  . Throat surgery  1983    polyps  . Angioplasty / stenting femoral Right 01/13/2013    superficial femoral artery x 2 (3 mm x 60 mm, 4 mm x 60 mm)  . Tonsillectomy and adenoidectomy  ~ 1965  . Amputation Right 01/28/2013    Procedure: RAY AMPUTATION RIGHT  4th & 5th TOE;  Surgeon: Rosetta Posner, MD;  Location: [facility] OR;  Service: Vascular;  Laterality: Right;  . Coronary artery bypass graft N/A 04/07/2013    Procedure: CORONARY ARTERY BYPASS GRAFTING (CABG);  Surgeon: Ivin Poot, MD;  Location: [place];  Service: Open Heart Surgery;  Laterality: N/A;  . Intraoperative transesophageal echocardiogram N/A 04/07/2013    Procedure: INTRAOPERATIVE TRANSESOPHAGEAL ECHOCARDIOGRAM;  Surgeon: Ivin Poot, MD;  Location: [place];  Service: Open Heart Surgery;  Laterality: N/A;  . Lower extremity angiogram Bilateral 01/06/2013    Procedure: LOWER EXTREMITY ANGIOGRAM;  Surgeon: Conrad Richland, MD;  Location: [facility] CATH LAB;  Service: Cardiovascular;  Laterality: Bilateral;  . Lower extremity angiogram Right 01/13/2013    Procedure: LOWER EXTREMITY ANGIOGRAM;  Surgeon: Conrad Barnwell, MD;  Location: [facility] CATH LAB;  Service: Cardiovascular;  Laterality: Right;  . Left heart catheterization with coronary angiogram N/A 04/05/2013    Procedure: LEFT  HEART CATHETERIZATION WITH CORONARY ANGIOGRAM;  Surgeon: Peter M Martinique, MD;  Location: [facility] CATH LAB;  Service: Cardiovascular;  Laterality: N/A;     Current Outpatient Prescriptions  Medication Sig Dispense Refill  . ACCU-CHEK FASTCLIX LANCETS MISC 40 Units by Does not apply route 2 (two) times daily. 1 each 5  . amLODipine (NORVASC) 10 MG tablet Take 1 tablet (10 mg total) by mouth daily. 90 tablet 1  . aspirin 81 MG tablet Take 1 tablet (81 mg total) by mouth daily.    Marland Kitchen atorvastatin (LIPITOR) 40 MG  tablet TAKE 1 TABLET BY MOUTH EVERY DAY AT 6 PM 90 tablet 1  . B-D ULTRAFINE III SHORT PEN 31G X 8 MM MISC USE WITH INSULIN PEN TWICE DAILY 100 each 0  . Blood Pressure Monitoring (ADULT BLOOD PRESSURE CUFF LG) KIT 1 Device by Does not apply route daily. 1 each 0  . carvedilol (COREG) 25 MG tablet TAKE 2 TABLETS BY MOUTH TWICE DAILY WITH A MEAL 60 tablet 3  . cloNIDine (CATAPRES) 0.2 MG tablet Take 1 tablet (0.2 mg total) by mouth 2 (two) times daily. 60 tablet 6  . furosemide (LASIX) 40 MG tablet 1 and 1/2 tablet daily 45 tablet 5  . glipiZIDE (GLUCOTROL) 10 MG tablet Take 1 tablet (10 mg total) by mouth 2 (two) times daily before a meal. 120 tablet 1  . glucose blood (ACCU-CHEK AVIVA) test strip Use as instructed. QS 3 times a day testing. 100 each 12  . hydrochlorothiazide (HYDRODIURIL) 25 MG tablet Take 0.5 tablets (12.5 mg total) by mouth daily. 90 tablet 1  . Lancet Devices (ACCU-CHEK SOFTCLIX) lancets Use as instructed to test 4 times a day.  Dx code 250.00.  May change to fit patients meter 100 each 12  . LANTUS SOLOSTAR 100 UNIT/ML Solostar Pen INJECT 40 UNITS INTO THE SKIN TWICE DAILY 15 mL 0  . spironolactone (ALDACTONE) 50 MG tablet Take 1 tablet (50 mg total) by mouth daily. 90 tablet 1   No current facility-administered medications for this visit.    Allergies:   Review of patient's allergies indicates no known allergies.    Social History:  The patient  reports that he quit smoking about 1 years ago. His smoking use included Cigarettes. He started smoking about 47 years ago. He has a 46 pack-year smoking history. He has never used smokeless tobacco. He reports that he does not drink alcohol or use illicit drugs.   Family History:  The patient's family history includes Cancer in his mother and sister; Heart attack in his sister; Hypertension in his mother and sister. There is no history of Stroke.    ROS:  Please see the history of present illness.   Otherwise, review of systems  are positive for none.   All other systems are reviewed and negative.    PHYSICAL EXAM: VS:  BP 190/80 mmHg  Pulse 68  Ht _0  (1.727 m)  Wt 232 lb (105.235 kg)  BMI 35.28 kg/m2 , BMI Body mass index is 35.28 kg/(m^2). GEN: Well nourished, well developed, in no acute distress HEENT: normal Neck: no JVD, carotid bruits, or masses Cardiac: RRR; there is a grade 1/6 systolic ejection murmur at the base.  There is no gallop.  There is trace edema. Respiratory:  clear to auscultation bilaterally, normal work of breathing GI: soft, nontender, nondistended, + BS MS: no deformity or atrophy Skin: warm and dry, no rash Neuro:  Strength and sensation are intact Psych: euthymic  mood, full affect   EKG:  EKG is not ordered today.    Recent Labs: 02/10/2014: B Natriuretic Peptide 246.9* 11/21/2014: TSH 9.673* 01/15/2015: ALT 13; BUN 50.4*; Creatinine 2.7*; HGB 8.2*; Platelets 161; Potassium 4.2; Sodium 138    Lipid Panel    Component Value Date/Time   CHOL 147 11/25/2012 1546   TRIG 400* 11/25/2012 1546   HDL 27* 11/25/2012 1546   CHOLHDL 5.4 11/25/2012 1546   VLDL 80* 11/25/2012 1546   LDLCALC 40 11/25/2012 1546      Wt Readings from Last 3 Encounters:  01/19/15 232 lb (105.235 kg)  01/15/15 238 lb 11.2 oz (108.274 kg)  12/15/14 234 lb 9.6 oz (106.414 kg)        ASSESSMENT AND PLAN:  1. Chronic diastolic heart failure with preserved ejection fraction. He currently appears to be euvolemic 2. Ischemic heart disease status post CABG in March 2015. No recurrent angina. 3. Renal insufficiency 4. Diabetes mellitus type 2 5. Essential hypertension 6. Anemia of chronic disease 7. 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   Current medicines are reviewed at length with the patient today.  The patient does not have concerns regarding medicines.  The following changes have been made:  no change  Labs/ tests ordered  today include:  No orders of the defined types were placed in this encounter.     Position: The patient is to continue current medication.  He will be followed closely with nephrology.  They have recently increased his furosemide and agreed with holding off on restarting his spironolactone and his ACE inhibitor.  Recheck here in 4 months with Dr. Johnsie Cancel. Berna Spare MD 01/19/2015 11:34 AM    [address], [place], [place]  [id number] Phone: [phone]; Fax: [phone]

## 2015-01-19 NOTE — Patient Instructions (Signed)
Medication Instructions:  Your physician recommends that you continue on your current medications as directed. Please refer to the Current Medication list given to you today.  Labwork: NONE  Testing/Procedures: NONE  Follow-Up: Your physician wants you to follow-up in: [place] will receive a reminder letter in the mail two months in advance. If you don't receive a letter, please call our office to schedule the follow-up appointment.  If you need a refill on your cardiac medications before your next appointment, please call your pharmacy.

## 2015-01-21 ENCOUNTER — Other Ambulatory Visit: Payer: Self-pay | Admitting: Family Medicine

## 2015-01-23 ENCOUNTER — Encounter: Payer: Self-pay | Admitting: Hematology and Oncology

## 2015-01-23 ENCOUNTER — Ambulatory Visit (HOSPITAL_BASED_OUTPATIENT_CLINIC_OR_DEPARTMENT_OTHER): Payer: Medicaid Other | Admitting: Hematology and Oncology

## 2015-01-23 VITALS — BP 171/66 | HR 68 | Temp 97.7°F | Resp 18 | Ht 68.0 in | Wt 235.4 lb

## 2015-01-23 DIAGNOSIS — N189 Chronic kidney disease, unspecified: Secondary | ICD-10-CM | POA: Diagnosis not present

## 2015-01-23 DIAGNOSIS — D631 Anemia in chronic kidney disease: Secondary | ICD-10-CM

## 2015-01-23 DIAGNOSIS — D472 Monoclonal gammopathy: Secondary | ICD-10-CM | POA: Diagnosis present

## 2015-01-23 DIAGNOSIS — I10 Essential (primary) hypertension: Secondary | ICD-10-CM

## 2015-01-23 NOTE — Progress Notes (Signed)
[name] PROGRESS NOTE  Patient Care Team: Rosemarie Ax, MD as PCP - General [name], MD as Consulting Physician (Nephrology)  SUMMARY OF ONCOLOGIC HISTORY:  Leonard [name] 64 y.o. male is here because of progressive anemia, renal failure and MGUS. I reviewed reports from his nephrologist office. The patient have significant peripheral vascular disease, diabetes and hypertension. Early this year, he had baseline creatinine of around 1.3 and subsequently developed heart failure. He was placed on aggressive medication/regimen and subsequently was noted to have progressive renal failure and anemia. According to his blood work dated 01/03/2015, white blood cell count is at 6.6, hemoglobin 7.1, platelet count of 153, creatinine of 2.4, calcium of 8.9, MCV 76, iron saturation 15%, ferritin 180, serum vitamin B-12 of 360. Serum protein electrophoresis initially detected M spike but subsequently not quantifiable. Urine immunofixation showed Bence-Jones Proteinuria. Echocardiogram show preserved ejection fraction 55-60% but with evidence of diastolic heart failure. He denies history of abnormal bone pain or bone fracture. Patient denies history of recurrent infection or atypical infections such as shingles of meningitis. Denies chills, night sweats, anorexia or abnormal weight loss. He complained of fatigue and shortness of breath on minimal exertion. The patient denies any recent signs or symptoms of bleeding such as spontaneous hematuria or hematochezia. He has occasional epistaxis when he blows his nose. He does not have spontaneous epistaxis  INTERVAL HISTORY: Please see below for problem oriented charting. He is here today to review test results. His sister is present. He continues to complain of fatigue. His blood pressure is very high today but he is not symptomatic. REVIEW OF SYSTEMS:   Constitutional: Denies fevers, chills or abnormal weight  loss Eyes: Denies blurriness of vision Ears, nose, mouth, throat, and face: Denies mucositis or sore throat Respiratory: Denies cough, dyspnea or wheezes Cardiovascular: Denies palpitation, chest discomfort or lower extremity swelling Gastrointestinal:  Denies nausea, heartburn or change in bowel habits Skin: Denies abnormal skin rashes Lymphatics: Denies new lymphadenopathy or easy bruising Neurological:Denies numbness, tingling or new weaknesses Behavioral/Psych: Mood is stable, no new changes  All other systems were reviewed with the patient and are negative.  I have reviewed the past medical history, past surgical history, social history and family history with the patient and they are unchanged from previous note.  ALLERGIES:  has No Known Allergies.  MEDICATIONS:  Current Outpatient Prescriptions  Medication Sig Dispense Refill  . ACCU-CHEK FASTCLIX LANCETS MISC 40 Units by Does not apply route 2 (two) times daily. 1 each 5  . amLODipine (NORVASC) 10 MG tablet Take 1 tablet (10 mg total) by mouth daily. 90 tablet 1  . aspirin 81 MG tablet Take 1 tablet (81 mg total) by mouth daily.    Marland Kitchen atorvastatin (LIPITOR) 40 MG tablet TAKE 1 TABLET BY MOUTH EVERY DAY AT 6 PM 90 tablet 1  . B-D ULTRAFINE III SHORT PEN 31G X 8 MM MISC USE WITH INSULIN PEN TWICE DAILY 100 each 0  . Blood Pressure Monitoring (ADULT BLOOD PRESSURE CUFF LG) KIT 1 Device by Does not apply route daily. 1 each 0  . carvedilol (COREG) 25 MG tablet TAKE 2 TABLETS BY MOUTH TWICE DAILY WITH A MEAL 60 tablet 3  . cloNIDine (CATAPRES) 0.2 MG tablet Take 1 tablet (0.2 mg total) by mouth 2 (two) times daily. 60 tablet 6  . furosemide (LASIX) 40 MG tablet 1 and 1/2 tablet daily (Patient taking differently: Take 60 mg by mouth 2 (two) times daily.  1 and 1/2 tablet daily) 45 tablet 5  . glipiZIDE (GLUCOTROL) 10 MG tablet TAKE 1 TABLET BY MOUTH TWICE DAILY BEFORE A MEAL 120 tablet 0  . glucose blood (ACCU-CHEK AVIVA) test strip Use  as instructed. QS 3 times a day testing. 100 each 12  . hydrochlorothiazide (HYDRODIURIL) 25 MG tablet Take 0.5 tablets (12.5 mg total) by mouth daily. 90 tablet 1  . Lancet Devices (ACCU-CHEK SOFTCLIX) lancets Use as instructed to test 4 times a day.  Dx code 250.00.  May change to fit patients meter 100 each 12  . LANTUS SOLOSTAR 100 UNIT/ML Solostar Pen INJECT 40 UNITS INTO THE SKIN TWICE DAILY (Patient taking differently: INJECT 45 UNITS INTO THE SKIN TWICE DAILY) 15 mL 0  . spironolactone (ALDACTONE) 50 MG tablet Take 1 tablet (50 mg total) by mouth daily. (Patient taking differently: Take 25 mg by mouth daily. ) 90 tablet 1   No current facility-administered medications for this visit.    PHYSICAL EXAMINATION: ECOG PERFORMANCE STATUS: 1 - Symptomatic but completely ambulatory  Filed Vitals:   01/23/15 1121  BP: 171/66  Pulse: 68  Temp: 97.7 F (36.5 C)  Resp: 18   Filed Weights   01/23/15 1121  Weight: 235 lb 6.4 oz (106.777 kg)    GENERAL:alert, no distress and comfortable. He is obese SKIN: skin color, texture, turgor are normal, no rashes or significant lesions EYES: normal, Conjunctiva are pale and non-injected, sclera clear OROPHARYNX:no exudate, no erythema and lips, buccal mucosa, and tongue normal  Musculoskeletal:no cyanosis of digits and no clubbing  NEURO: alert & oriented x 3 with fluent speech, no focal motor/sensory deficits  LABORATORY DATA:  I have reviewed the data as listed    Component Value Date/Time   NA 138 01/15/2015 1416   NA 136 12/12/2014 1019   K 4.2 01/15/2015 1416   K 3.9 12/12/2014 1019   CL 106 12/12/2014 1019   CO2 25 01/15/2015 1416   CO2 18* 12/12/2014 1019   GLUCOSE 344* 01/15/2015 1416   GLUCOSE 123* 12/12/2014 1019   BUN 50.4* 01/15/2015 1416   BUN 54* 12/12/2014 1019   CREATININE 2.7* 01/15/2015 1416   CREATININE 2.27* 12/12/2014 1019   CREATININE 1.51* 04/12/2014 1016   CALCIUM 9.7 01/15/2015 1416   CALCIUM 8.8 12/12/2014  1019   PROT 7.3 01/15/2015 1416   PROT 6.5 02/10/2014 1045   ALBUMIN 4.3 01/15/2015 1416   ALBUMIN 3.6 02/10/2014 1045   AST 13 01/15/2015 1416   AST 26 02/10/2014 1045   ALT 13 01/15/2015 1416   ALT 30 02/10/2014 1045   ALKPHOS 83 01/15/2015 1416   ALKPHOS 73 02/10/2014 1045   BILITOT 0.35 01/15/2015 1416   BILITOT 0.5 02/10/2014 1045   GFRNONAA 31* 11/28/2014 1440   GFRNONAA 77* 02/13/2014 0348   GFRAA 36* 11/28/2014 1440   GFRAA 89* 02/13/2014 0348    No results found for: SPEP, UPEP  Lab Results  Component Value Date   WBC 6.7 01/15/2015   NEUTROABS 4.6 01/15/2015   HGB 8.2* 01/15/2015   HCT 25.8* 01/15/2015   MCV 77.7* 01/15/2015   PLT 161 01/15/2015      Chemistry      Component Value Date/Time   NA 138 01/15/2015 1416   NA 136 12/12/2014 1019   K 4.2 01/15/2015 1416   K 3.9 12/12/2014 1019   CL 106 12/12/2014 1019   CO2 25 01/15/2015 1416   CO2 18* 12/12/2014 1019   BUN 50.4* 01/15/2015  1416   BUN 54* 12/12/2014 1019   CREATININE 2.7* 01/15/2015 1416   CREATININE 2.27* 12/12/2014 1019   CREATININE 1.51* 04/12/2014 1016      Component Value Date/Time   CALCIUM 9.7 01/15/2015 1416   CALCIUM 8.8 12/12/2014 1019   ALKPHOS 83 01/15/2015 1416   ALKPHOS 73 02/10/2014 1045   AST 13 01/15/2015 1416   AST 26 02/10/2014 1045   ALT 13 01/15/2015 1416   ALT 30 02/10/2014 1045   BILITOT 0.35 01/15/2015 1416   BILITOT 0.5 02/10/2014 1045      ASSESSMENT & PLAN:  MGUS (monoclonal gammopathy of unknown significance)  The patient was originally referred here because of detectable M spike in his blood which subsequently become undetectable. 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 Extensive workup last week did not take up any abnormal M spike. Although his serum light chains were high, this is likely related to renal disease and the ratio was just barely above the upper limit of normal. For this, I would recommend repeat follow-up in  about 6 months. In the future, I would consider adding 24-hour urine collection for this. In any case, I do not believe his renal failure and anemia is related to multiple myeloma.  I do not feel that he would require bone marrow biopsy for this.  Essential hypertension, benign  The patient has poorly controlled hypertension. He brought with him a book that documented his blood pressure monitoring at home, which showed that his systolic blood pressure ranged from 140-160. I will defer to his other physicians for blood pressure medication management.   Anemia in chronic kidney disease  The patient has a lack of reticulocytosis with relatively poor response with erythropoietin production. 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  we discussed treatment options including erythropoietin stimulating agents. We discussed some of the risks, benefits, and alternatives of erythropoietin stimulating agents such as Procrit or Aranesp. The patient is symptomatic from anemia and the EPO level is low. Some of the side-effects to be expected including risks of allergic reactions, skin rashes, headaches, risk of blood clots including heart attack and stroke. There is rare risks of causing growth of cancers.The patient is want to go home and think about it. In the meantime, he is not symptomatic from his severe anemia.   Orders Placed This Encounter  Procedures  . CBC & Diff and Retic    Standing Status: Standing     Number of Occurrences: 33     Standing Expiration Date: 01/23/2016  . Hold Tube, Blood Bank    Standing Status: Standing     Number of Occurrences: 2     Standing Expiration Date: 01/23/2016   All questions were answered. The patient knows to call the clinic with any problems, questions or concerns. No barriers to learning was detected. I spent 20 minutes counseling the patient face to face. The total time spent in the appointment was 25 minutes and more  than 50% was on counseling and review of test results     [facility], [place], MD 01/23/2015 1:14 PM

## 2015-01-23 NOTE — Assessment & Plan Note (Signed)
The patient has poorly controlled hypertension. He brought with him a book that documented his blood pressure monitoring at home, which showed that his systolic blood pressure ranged from 140-160. I will defer to his other physicians for blood pressure medication management.

## 2015-01-23 NOTE — Assessment & Plan Note (Signed)
The patient was originally referred here because of detectable M spike in his blood which subsequently become undetectable. 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 Extensive workup last week did not take up any abnormal M spike. Although his serum light chains were high, this is likely related to renal disease and the ratio was just barely above the upper limit of normal. For this, I would recommend repeat follow-up in about 6 months. In the future, I would consider adding 24-hour urine collection for this. In any case, I do not believe his renal failure and anemia is related to multiple myeloma.  I do not feel that he would require bone marrow biopsy for this.

## 2015-01-23 NOTE — Assessment & Plan Note (Signed)
The patient has a lack of reticulocytosis with relatively poor response with erythropoietin production. 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  we discussed treatment options including erythropoietin stimulating agents. We discussed some of the risks, benefits, and alternatives of erythropoietin stimulating agents such as Procrit or Aranesp. The patient is symptomatic from anemia and the EPO level is low. Some of the side-effects to be expected including risks of allergic reactions, skin rashes, headaches, risk of blood clots including heart attack and stroke. There is rare risks of causing growth of cancers.The patient is want to go home and think about it. In the meantime, he is not symptomatic from his severe anemia.

## 2015-01-25 ENCOUNTER — Other Ambulatory Visit: Payer: Self-pay | Admitting: Family Medicine

## 2015-01-26 ENCOUNTER — Encounter: Payer: Self-pay | Admitting: *Deleted

## 2015-01-30 ENCOUNTER — Other Ambulatory Visit: Payer: Self-pay | Admitting: Hematology and Oncology

## 2015-01-30 ENCOUNTER — Telehealth: Payer: Self-pay | Admitting: Hematology and Oncology

## 2015-01-30 ENCOUNTER — Telehealth: Payer: Self-pay | Admitting: *Deleted

## 2015-01-30 ENCOUNTER — Telehealth: Payer: Self-pay | Admitting: Family Medicine

## 2015-01-30 MED ORDER — ACCU-CHEK AVIVA DEVI: Status: DC

## 2015-01-30 NOTE — Telephone Encounter (Signed)
Aware of all new appointments

## 2015-01-30 NOTE — Telephone Encounter (Signed)
Pt notified that Dr Alvy Bimler has scheduled labs and injection every 2 weeks. Was notified by scheduler with appt times

## 2015-01-30 NOTE — Telephone Encounter (Signed)
Pt called and said that he needs a new machine for checking his diabetes. jw

## 2015-01-30 NOTE — Telephone Encounter (Signed)
-----   Message from Heath Lark, MD sent at 01/30/2015  8:43 AM EST ----- Regarding: RE: injection I placed POF ----- Message -----    From: Patton Salles, RN    Sent: 01/26/2015  12:40 PM      To: Heath Lark, MD Subject: injection                                      Leonard [name] called to see if he is supposed to be scheduled for an injection- Procrit/Aranesp. I did not see pof, but saw that you discussed with him  Thanks, Nabria Nevin

## 2015-02-01 ENCOUNTER — Ambulatory Visit (HOSPITAL_BASED_OUTPATIENT_CLINIC_OR_DEPARTMENT_OTHER): Payer: Medicaid Other

## 2015-02-01 ENCOUNTER — Other Ambulatory Visit (HOSPITAL_BASED_OUTPATIENT_CLINIC_OR_DEPARTMENT_OTHER): Payer: Medicaid Other

## 2015-02-01 VITALS — BP 131/59 | HR 66 | Temp 98.2°F

## 2015-02-01 DIAGNOSIS — N189 Chronic kidney disease, unspecified: Secondary | ICD-10-CM

## 2015-02-01 DIAGNOSIS — D631 Anemia in chronic kidney disease: Secondary | ICD-10-CM

## 2015-02-01 LAB — CBC & DIFF AND RETIC
BASO%: 0.5 % (ref 0.0–2.0)
Basophils Absolute: 0 10*3/uL (ref 0.0–0.1)
EOS%: 2.9 % (ref 0.0–7.0)
Eosinophils Absolute: 0.2 10*3/uL (ref 0.0–0.5)
HCT: 28.9 % — ABNORMAL LOW (ref 38.4–49.9)
HGB: 9.4 g/dL — ABNORMAL LOW (ref 13.0–17.1)
Immature Retic Fract: 7.2 % (ref 3.00–10.60)
LYMPH%: 33.9 % (ref 14.0–49.0)
MCH: 24.4 pg — AB (ref 27.2–33.4)
MCHC: 32.5 g/dL (ref 32.0–36.0)
MCV: 75.1 fL — AB (ref 79.3–98.0)
MONO#: 0.4 10*3/uL (ref 0.1–0.9)
MONO%: 7 % (ref 0.0–14.0)
NEUT%: 55.7 % (ref 39.0–75.0)
NEUTROS ABS: 3.3 10*3/uL (ref 1.5–6.5)
Platelets: 189 10*3/uL (ref 140–400)
RBC: 3.85 10*6/uL — AB (ref 4.20–5.82)
RDW: 15.6 % — ABNORMAL HIGH (ref 11.0–14.6)
Retic %: 2.36 % — ABNORMAL HIGH (ref 0.80–1.80)
Retic Ct Abs: 90.86 10*3/uL (ref 34.80–93.90)
WBC: 5.8 10*3/uL (ref 4.0–10.3)
lymph#: 2 10*3/uL (ref 0.9–3.3)

## 2015-02-01 LAB — HOLD TUBE, BLOOD BANK

## 2015-02-01 MED ORDER — DARBEPOETIN ALFA 200 MCG/0.4ML IJ SOSY
200.0000 ug | PREFILLED_SYRINGE | Freq: Once | INTRAMUSCULAR | Status: AC
  Administered 2015-02-01: 200 ug via SUBCUTANEOUS
  Filled 2015-02-01: qty 0.4

## 2015-02-01 NOTE — Patient Instructions (Signed)
Darbepoetin Alfa injection What is this medicine? DARBEPOETIN ALFA (dar be POE e tin AL fa) helps your body make more red blood cells. It is used to treat anemia caused by chronic kidney failure and chemotherapy. This medicine may be used for other purposes; ask your health care provider or pharmacist if you have questions. What should I tell my health care provider before I take this medicine? They need to know if you have any of these conditions: -blood clotting disorders or history of blood clots -cancer patient not on chemotherapy -cystic fibrosis -heart disease, such as angina, heart failure, or a history of a heart attack -hemoglobin level of 12 g/dL or greater -high blood pressure -low levels of folate, iron, or vitamin B12 -seizures -an unusual or allergic reaction to darbepoetin, erythropoietin, albumin, hamster proteins, latex, other medicines, foods, dyes, or preservatives -pregnant or trying to get pregnant -breast-feeding How should I use this medicine? This medicine is for injection into a vein or under the skin. It is usually given by a health care professional in a hospital or clinic setting. If you get this medicine at home, you will be taught how to prepare and give this medicine. Do not shake the solution before you withdraw a dose. Use exactly as directed. Take your medicine at regular intervals. Do not take your medicine more often than directed. It is important that you put your used needles and syringes in a special sharps container. Do not put them in a trash can. If you do not have a sharps container, call your pharmacist or healthcare provider to get one. Talk to your pediatrician regarding the use of this medicine in children. While this medicine may be used in children as young as 1 year for selected conditions, precautions do apply. Overdosage: If you think you have taken too much of this medicine contact a poison control center or emergency room at once. NOTE:  This medicine is only for you. Do not share this medicine with others. What if I miss a dose? If you miss a dose, take it as soon as you can. If it is almost time for your next dose, take only that dose. Do not take double or extra doses. What may interact with this medicine? Do not take this medicine with any of the following medications: -epoetin alfa This list may not describe all possible interactions. Give your health care provider a list of all the medicines, herbs, non-prescription drugs, or dietary supplements you use. Also tell them if you smoke, drink alcohol, or use illegal drugs. Some items may interact with your medicine. What should I watch for while using this medicine? Visit your prescriber or health care professional for regular checks on your progress and for the needed blood tests and blood pressure measurements. It is especially important for the doctor to make sure your hemoglobin level is in the desired range, to limit the risk of potential side effects and to give you the best benefit. Keep all appointments for any recommended tests. Check your blood pressure as directed. Ask your doctor what your blood pressure should be and when you should contact him or her. As your body makes more red blood cells, you may need to take iron, folic acid, or vitamin B supplements. Ask your doctor or health care provider which products are right for you. If you have kidney disease continue dietary restrictions, even though this medication can make you feel better. Talk with your doctor or health care professional about the   foods you eat and the vitamins that you take. What side effects may I notice from receiving this medicine? Side effects that you should report to your doctor or health care professional as soon as possible: -allergic reactions like skin rash, itching or hives, swelling of the face, lips, or tongue -breathing problems -changes in vision -chest pain -confusion, trouble speaking  or understanding -feeling faint or lightheaded, falls -high blood pressure -muscle aches or pains -pain, swelling, warmth in the leg -rapid weight gain -severe headaches -sudden numbness or weakness of the face, arm or leg -trouble walking, dizziness, loss of balance or coordination -seizures (convulsions) -swelling of the ankles, feet, hands -unusually weak or tired Side effects that usually do not require medical attention (report to your doctor or health care professional if they continue or are bothersome): -diarrhea -fever, chills (flu-like symptoms) -headaches -nausea, vomiting -redness, stinging, or swelling at site where injected This list may not describe all possible side effects. Call your doctor for medical advice about side effects. You may report side effects to FDA at 1-800-FDA-1088. Where should I keep my medicine? Keep out of the reach of children. Store in a refrigerator between 2 and 8 degrees C (36 and 46 degrees F). Do not freeze. Do not shake. Throw away any unused portion if using a single-dose vial. Throw away any unused medicine after the expiration date. NOTE: This sheet is a summary. It may not cover all possible information. If you have questions about this medicine, talk to your doctor, pharmacist, or health care provider.    2016, [employer]. (2008-01-04 10:23:57)  

## 2015-02-04 HISTORY — PX: SKIN CANCER EXCISION: SHX779

## 2015-02-05 ENCOUNTER — Encounter (HOSPITAL_COMMUNITY): Payer: Self-pay

## 2015-02-05 ENCOUNTER — Emergency Department (HOSPITAL_COMMUNITY)
Admission: EM | Admit: 2015-02-05 | Discharge: 2015-02-05 | Disposition: A | Discharge status: Home / Self Care | Payer: Medicaid Other | Attending: Emergency Medicine | Admitting: Emergency Medicine

## 2015-02-05 ENCOUNTER — Emergency Department (HOSPITAL_COMMUNITY): Payer: Medicaid Other

## 2015-02-05 DIAGNOSIS — Z87891 Personal history of nicotine dependence: Secondary | ICD-10-CM | POA: Insufficient documentation

## 2015-02-05 DIAGNOSIS — Z7984 Long term (current) use of oral hypoglycemic drugs: Secondary | ICD-10-CM | POA: Diagnosis not present

## 2015-02-05 DIAGNOSIS — L989 Disorder of the skin and subcutaneous tissue, unspecified: Secondary | ICD-10-CM | POA: Insufficient documentation

## 2015-02-05 DIAGNOSIS — Z862 Personal history of diseases of the blood and blood-forming organs and certain disorders involving the immune mechanism: Secondary | ICD-10-CM | POA: Diagnosis not present

## 2015-02-05 DIAGNOSIS — E7800 Pure hypercholesterolemia, unspecified: Secondary | ICD-10-CM | POA: Diagnosis not present

## 2015-02-05 DIAGNOSIS — N390 Urinary tract infection, site not specified: Secondary | ICD-10-CM | POA: Diagnosis not present

## 2015-02-05 DIAGNOSIS — Z951 Presence of aortocoronary bypass graft: Secondary | ICD-10-CM | POA: Insufficient documentation

## 2015-02-05 DIAGNOSIS — E1165 Type 2 diabetes mellitus with hyperglycemia: Secondary | ICD-10-CM | POA: Insufficient documentation

## 2015-02-05 DIAGNOSIS — I10 Essential (primary) hypertension: Secondary | ICD-10-CM | POA: Insufficient documentation

## 2015-02-05 DIAGNOSIS — Z9889 Other specified postprocedural states: Secondary | ICD-10-CM | POA: Diagnosis not present

## 2015-02-05 DIAGNOSIS — Z79899 Other long term (current) drug therapy: Secondary | ICD-10-CM | POA: Insufficient documentation

## 2015-02-05 DIAGNOSIS — Z794 Long term (current) use of insulin: Secondary | ICD-10-CM | POA: Diagnosis not present

## 2015-02-05 DIAGNOSIS — R739 Hyperglycemia, unspecified: Secondary | ICD-10-CM

## 2015-02-05 LAB — CBC
HCT: 28.6 % — ABNORMAL LOW (ref 39.0–52.0)
HEMOGLOBIN: 9.2 g/dL — AB (ref 13.0–17.0)
MCH: 24.1 pg — AB (ref 26.0–34.0)
MCHC: 32.2 g/dL (ref 30.0–36.0)
MCV: 75.1 fL — AB (ref 78.0–100.0)
PLATELETS: 177 10*3/uL (ref 150–400)
RBC: 3.81 MIL/uL — AB (ref 4.22–5.81)
RDW: 15.5 % (ref 11.5–15.5)
WBC: 7.2 10*3/uL (ref 4.0–10.5)

## 2015-02-05 LAB — BASIC METABOLIC PANEL
Anion gap: 11 (ref 5–15)
BUN: 59 mg/dL — ABNORMAL HIGH (ref 6–20)
CALCIUM: 9.2 mg/dL (ref 8.9–10.3)
CHLORIDE: 96 mmol/L — AB (ref 101–111)
CO2: 25 mmol/L (ref 22–32)
CREATININE: 2.5 mg/dL — AB (ref 0.61–1.24)
GFR calc non Af Amer: 26 mL/min — ABNORMAL LOW (ref >60)
GFR, EST AFRICAN AMERICAN: 30 mL/min — AB (ref >60)
GLUCOSE: 486 mg/dL — AB (ref 65–99)
Potassium: 4.6 mmol/L (ref 3.5–5.1)
Sodium: 132 mmol/L — ABNORMAL LOW (ref 135–145)

## 2015-02-05 LAB — URINE MICROSCOPIC-ADD ON

## 2015-02-05 LAB — URINALYSIS, ROUTINE W REFLEX MICROSCOPIC
BILIRUBIN URINE: NEGATIVE
Ketones, ur: NEGATIVE mg/dL
Leukocytes, UA: NEGATIVE
Nitrite: NEGATIVE
Protein, ur: 30 mg/dL — AB
SPECIFIC GRAVITY, URINE: 1.014 (ref 1.005–1.030)
pH: 6 (ref 5.0–8.0)

## 2015-02-05 LAB — CBG MONITORING, ED
GLUCOSE-CAPILLARY: 392 mg/dL — AB (ref 65–99)
Glucose-Capillary: 439 mg/dL — ABNORMAL HIGH (ref 65–99)

## 2015-02-05 MED ORDER — CEFPODOXIME PROXETIL 200 MG PO TABS: 200.0000 mg | ORAL_TABLET | Freq: Two times a day (BID) | ORAL | Status: AC

## 2015-02-05 MED ORDER — SODIUM CHLORIDE 0.9 % IV BOLUS (SEPSIS)
1000.0000 mL | Freq: Once | INTRAVENOUS | Status: AC
  Administered 2015-02-05: 1000 mL via INTRAVENOUS

## 2015-02-05 MED ORDER — CEFPODOXIME PROXETIL 200 MG PO TABS
200.0000 mg | ORAL_TABLET | ORAL | Status: AC
  Administered 2015-02-05: 200 mg via ORAL
  Filled 2015-02-05: qty 1

## 2015-02-05 NOTE — ED Provider Notes (Signed)
CSN: [id number]     Arrival date & time 02/05/15  1250 History   First MD Initiated Contact with Patient 02/05/15 1618     Chief Complaint  Patient presents with  . Hyperglycemia   65 year old [place] American male with PMH of HTN, PAD, HLD, DM 2 on Lantus 45 twice a day he presents with hyperglycemia for about a week. Patient says his glucometer broke last Friday and when he got a replacement noticed that his blood sugars were in the 500s and typically they're in the 200s. Endorses that he's been having pain more frequently. Denies any fevers chills nausea vomiting diarrhea constipation chest pain shortness of breath when pain and hematuria dysuria sick contacts or recent travel.  (Consider location/radiation/quality/duration/timing/severity/associated sxs/prior Treatment) Patient is a 65 y.o. male presenting with hyperglycemia.  Hyperglycemia Blood sugar level PTA:  500 Severity:  Moderate Onset quality:  Sudden Duration:  1 week Timing:  Constant Progression:  Unchanged Chronicity:  New Diabetes status:  Controlled with insulin Current diabetic therapy:  40-45 lantus BID Context: change in medication   Context comment:  Increasing 1 unit per dose until can control sugar: has increased from 40 to 45 bid with no help Associated symptoms: polyuria   Associated symptoms: no abdominal pain, no chest pain, no dizziness, no dysuria, no fever, no nausea, no shortness of breath and no vomiting     Past Medical History  Diagnosis Date  . Hypertension   . PAD (peripheral artery disease) ([place])   . Gangrene of toe ([place])     right 5th/notes 01/04/2013   . High cholesterol   . Type II diabetes mellitus ([place])   . MGUS (monoclonal gammopathy of unknown significance) 01/15/2015   Past Surgical History  Procedure Laterality Date  . Throat surgery  1983    polyps  . Angioplasty / stenting femoral Right 01/13/2013    superficial femoral artery x 2 (3 mm x 60 mm, 4 mm x 60 mm)  . Tonsillectomy  and adenoidectomy  ~ 1965  . Amputation Right 01/28/2013    Procedure: RAY AMPUTATION RIGHT  4th & 5th TOE;  Surgeon: Rosetta Posner, MD;  Location: [facility] OR;  Service: Vascular;  Laterality: Right;  . Coronary artery bypass graft N/A 04/07/2013    Procedure: CORONARY ARTERY BYPASS GRAFTING (CABG);  Surgeon: Ivin Poot, MD;  Location: [place];  Service: Open Heart Surgery;  Laterality: N/A;  . Intraoperative transesophageal echocardiogram N/A 04/07/2013    Procedure: INTRAOPERATIVE TRANSESOPHAGEAL ECHOCARDIOGRAM;  Surgeon: Ivin Poot, MD;  Location: [place];  Service: Open Heart Surgery;  Laterality: N/A;  . Lower extremity angiogram Bilateral 01/06/2013    Procedure: LOWER EXTREMITY ANGIOGRAM;  Surgeon: Conrad Cloverleaf, MD;  Location: [facility] CATH LAB;  Service: Cardiovascular;  Laterality: Bilateral;  . Lower extremity angiogram Right 01/13/2013    Procedure: LOWER EXTREMITY ANGIOGRAM;  Surgeon: Conrad [place], MD;  Location: [facility] CATH LAB;  Service: Cardiovascular;  Laterality: Right;  . Left heart catheterization with coronary angiogram N/A 04/05/2013    Procedure: LEFT HEART CATHETERIZATION WITH CORONARY ANGIOGRAM;  Surgeon: Peter M Martinique, MD;  Location: [facility] CATH LAB;  Service: Cardiovascular;  Laterality: N/A;   Family History  Problem Relation Age of Onset  . Cancer Mother     lung cancer  . Hypertension Mother   . Cancer Sister     patient thinks it was uterine cancer  . Hypertension Sister   . Heart attack Sister   . Stroke Neg  Hx    Social History  Substance Use Topics  . Smoking status: Former Smoker -- 1.00 packs/day for 46 years    Types: Cigarettes    Start date: 02/04/1967    Quit date: 02/03/2013  . Smokeless tobacco: Never Used     Comment: Doing well with quitting.  . Alcohol Use: No    Review of Systems  Constitutional: Negative for fever and chills.  Respiratory: Negative for shortness of breath.   Cardiovascular: Negative for chest pain, palpitations and leg swelling.   Gastrointestinal: Negative for nausea, vomiting, abdominal pain, diarrhea, constipation and abdominal distention.  Endocrine: Positive for polyuria.       Sugars running high  Genitourinary: Negative for dysuria, frequency, hematuria, flank pain, decreased urine volume, discharge, difficulty urinating and genital sores.  Neurological: Negative for dizziness, speech difficulty, light-headedness and headaches.  All other systems reviewed and are negative.     Allergies  Review of patient's allergies indicates no known allergies.  Home Medications   Prior to Admission medications   Medication Sig Start Date End Date Taking? Authorizing Provider  amLODipine (NORVASC) 10 MG tablet Take 1 tablet (10 mg total) by mouth daily. 08/25/14  Yes Rosemarie Ax, MD  aspirin 81 MG tablet Take 1 tablet (81 mg total) by mouth daily. 09/15/13  Yes Zenia Resides, MD  atorvastatin (LIPITOR) 40 MG tablet TAKE 1 TABLET BY MOUTH EVERY DAY AT 6 PM 07/07/14  Yes Leone Haven, MD  carvedilol (COREG) 25 MG tablet TAKE 2 TABLETS BY MOUTH TWICE DAILY WITH A MEAL 12/18/14  Yes Rosemarie Ax, MD  cloNIDine (CATAPRES) 0.2 MG tablet Take 1 tablet (0.2 mg total) by mouth 2 (two) times daily. 05/11/14  Yes Rosemarie Ax, MD  furosemide (LASIX) 40 MG tablet 1 and 1/2 tablet daily Patient taking differently: Take 60 mg by mouth 2 (two) times daily. 1 and 1/2 tablet daily 04/12/14  Yes Darlin Coco, MD  glipiZIDE (GLUCOTROL) 10 MG tablet TAKE 1 TABLET BY MOUTH TWICE DAILY BEFORE A MEAL 01/22/15  Yes Rosemarie Ax, MD  hydrochlorothiazide (HYDRODIURIL) 25 MG tablet Take 0.5 tablets (12.5 mg total) by mouth daily. 05/11/14  Yes Rosemarie Ax, MD  LANTUS SOLOSTAR 100 UNIT/ML Solostar Pen INJECT 40 UNITS INTO THE SKIN TWICE DAILY Patient taking differently: Inject 45 units into the skin twice daily. 01/26/15  Yes Rosemarie Ax, MD  spironolactone (ALDACTONE) 50 MG tablet Take 1 tablet (50 mg total) by mouth  daily. Patient taking differently: Take 25 mg by mouth daily.  08/25/14  Yes Rosemarie Ax, MD  B-D ULTRAFINE III SHORT PEN 31G X 8 MM MISC USE WITH INSULIN PEN TWICE DAILY 12/27/14   Rosemarie Ax, MD  cefpodoxime (VANTIN) 200 MG tablet Take 1 tablet (200 mg total) by mouth 2 (two) times daily. 02/05/15 02/19/15  Sherian Maroon, MD   BP 147/84 mmHg  Pulse 65  Temp(Src) 98.1 F (36.7 C) (Oral)  Resp 15  Ht 5\' 8"  (1.727 m)  Wt 106.595 kg  BMI 35.74 kg/m2  SpO2 96% Physical Exam  Constitutional: He is oriented to person, place, and time. He appears well-developed and well-nourished. No distress.  HENT:  Head: Normocephalic and atraumatic.  Small raised lesion on tip of nose. No sign of infection.   Eyes: Pupils are equal, round, and reactive to light.  Neck: Normal range of motion.  Cardiovascular: Normal rate, regular rhythm, normal heart sounds and intact distal pulses.  Exam  reveals no gallop and no friction rub.   No murmur heard. Pulmonary/Chest: Effort normal and breath sounds normal. No respiratory distress. He has no wheezes. He has no rales. He exhibits no tenderness.  Abdominal: Soft. Bowel sounds are normal. He exhibits no distension and no mass. There is no tenderness. There is no rebound and no guarding.  Musculoskeletal: Normal range of motion.  Lymphadenopathy:    He has no cervical adenopathy.  Neurological: He is alert and oriented to person, place, and time. No cranial nerve deficit. Coordination normal.  Skin: Skin is warm and dry. He is not diaphoretic.  Nursing note and vitals reviewed.   ED Course  Procedures (including critical care time) Labs Review Labs Reviewed  BASIC METABOLIC PANEL - Abnormal; Notable for the following:    Sodium 132 (*)    Chloride 96 (*)    Glucose, Bld 486 (*)    BUN 59 (*)    Creatinine, Ser 2.50 (*)    GFR calc non Af Amer 26 (*)    GFR calc Af Amer 30 (*)    All other components within normal limits  CBC - Abnormal; Notable  for the following:    RBC 3.81 (*)    Hemoglobin 9.2 (*)    HCT 28.6 (*)    MCV 75.1 (*)    MCH 24.1 (*)    All other components within normal limits  URINALYSIS, ROUTINE W REFLEX MICROSCOPIC (NOT AT [facility]) - Abnormal; Notable for the following:    Glucose, UA >1000 (*)    Hgb urine dipstick TRACE (*)    Protein, ur 30 (*)    All other components within normal limits  URINE MICROSCOPIC-ADD ON - Abnormal; Notable for the following:    Squamous Epithelial / LPF 0-5 (*)    Bacteria, UA MANY (*)    All other components within normal limits  CBG MONITORING, ED - Abnormal; Notable for the following:    Glucose-Capillary 439 (*)    All other components within normal limits  CBG MONITORING, ED - Abnormal; Notable for the following:    Glucose-Capillary 392 (*)    All other components within normal limits  URINE CULTURE  HEMOGLOBIN A1C    Imaging Review Dg Chest 2 View  02/05/2015  CLINICAL DATA:  Elevated blood sugar. No chest pain. History of hypertension. EXAM: CHEST  2 VIEW COMPARISON:  02/10/2014. FINDINGS: Cardiomegaly. Prior CABG. Thoracic atherosclerosis. Chronic basilar markings without active infiltrates or failure. No effusion or pneumothorax. No osseous findings. IMPRESSION: Cardiomegaly.  CABG.  No active disease. Electronically Signed   By: Staci Righter M.D.   On: 02/05/2015 17:32   I have personally reviewed and evaluated these images and lab results as part of my medical decision-making.   EKG Interpretation   Date/Time:  Monday February 05 2015 18:36:28 EST Ventricular Rate:  63 PR Interval:  188 QRS Duration: 93 QT Interval:  435 QTC Calculation: 445 R Axis:   29 Text Interpretation:  Sinus rhythm Atrial premature complex Abnormal T,  consider ischemia, lateral leads Borderline ST elevation, anterior leads  No significant change since last tracing Confirmed by [facility] MD, Corene Cornea  [phone]) on 02/05/2015 7:26:38 PM      MDM   Final diagnoses:  Hyperglycemia  UTI  (lower urinary tract infection)   65 year old male here with hyperglycemia with no sign of infection. Physical exam  Completely benign.  Urine c/w UTI. No sign of DKA or HHS. Therefore, stable for DC home w/abx  for UTI. FU to PCP. Strict return if worsening symptoms. (Also recommended FU to dermatology to assess nose legion.)   Pt was seen under the supervision of Dr. Dayna Barker.     Sherian Maroon, MD 02/05/15 2009  Merrily Pew, MD 02/08/15 1520

## 2015-02-05 NOTE — ED Notes (Signed)
Patient transported to X-ray 

## 2015-02-05 NOTE — ED Notes (Addendum)
Lelon Frohlich (sister to be called with update ) [phone] [name] (sister )[phone]

## 2015-02-05 NOTE — Discharge Instructions (Signed)

## 2015-02-05 NOTE — ED Notes (Signed)
Pt given urinal and made aware urine sample is needed.

## 2015-02-05 NOTE — ED Notes (Signed)
Sister at bedside, states pt received procrit injection at [facility] oncology and wishes to speak with MD in regards to this being the reason that pt blood sugar high. Dr. Tamala Julian made aware and states will come speak to family .

## 2015-02-05 NOTE — ED Notes (Signed)
Pt. Left with all belongings. Discharge instructions were reviewed and all questions were answered.  

## 2015-02-05 NOTE — ED Notes (Signed)
Ria to add on HGB A1c to labs drawn.

## 2015-02-05 NOTE — ED Notes (Signed)
Patient complaining of increased blood sugar x 4 days. Denies any pain or complaints. Has not had any illness

## 2015-02-05 NOTE — ED Notes (Signed)
cbg 392

## 2015-02-05 NOTE — ED Notes (Signed)
Pt reminded that a urine sample is still needed but he stated he does not have to urinate at this time but will let staff know when he does. Urinal at bedside within reach.

## 2015-02-06 LAB — HEMOGLOBIN A1C
Hgb A1c MFr Bld: 12 % — ABNORMAL HIGH (ref 4.8–5.6)
MEAN PLASMA GLUCOSE: 298 mg/dL

## 2015-02-08 LAB — URINE CULTURE
Culture: >=100000
Special Requests: NORMAL

## 2015-02-09 ENCOUNTER — Telehealth (HOSPITAL_BASED_OUTPATIENT_CLINIC_OR_DEPARTMENT_OTHER): Payer: Self-pay | Admitting: Emergency Medicine

## 2015-02-09 NOTE — Progress Notes (Signed)
ED Antimicrobial Stewardship Positive Culture Follow Up   Leonard [name] is an 65 y.o. male who presented to [facility] on 02/05/2015 with a chief complaint of  Chief Complaint  Patient presents with  . Hyperglycemia    Recent Results (from the past 720 hour(s))  Urine culture     Status: None   Collection Time: 02/05/15  6:45 PM  Result Value Ref Range Status   Specimen Description URINE, RANDOM  Final   Special Requests Normal  Final   Culture >=100,000 COLONIES/mL ESCHERICHIA COLI  Final   Report Status 02/08/2015 FINAL  Final   Organism ID, Bacteria ESCHERICHIA COLI  Final      Susceptibility   Escherichia coli - MIC*    AMPICILLIN >=32 RESISTANT Resistant     CEFAZOLIN >=64 RESISTANT Resistant     CEFTRIAXONE <=1 SENSITIVE Sensitive     CIPROFLOXACIN <=0.25 SENSITIVE Sensitive     GENTAMICIN >=16 RESISTANT Resistant     IMIPENEM <=0.25 SENSITIVE Sensitive     NITROFURANTOIN <=16 SENSITIVE Sensitive     TRIMETH/SULFA <=20 SENSITIVE Sensitive     AMPICILLIN/SULBACTAM >=32 RESISTANT Resistant     PIP/TAZO >=128 RESISTANT Resistant     * >=100,000 COLONIES/mL ESCHERICHIA COLI    No symptoms of UTI, asymptomatic bacteriuria. No treatment indicated  ED Provider: Montine Circle, PA-C   Melburn Popper, PharmD Clinical Pharmacy Resident Pager: [phone] 02/09/2015 8:29 AM

## 2015-02-09 NOTE — Telephone Encounter (Signed)
Post ED Visit - Positive Culture Follow-up  Culture report reviewed by antimicrobial stewardship pharmacist:  []  Elenor Quinones, Pharm.D. []  Heide Guile, Pharm.D., BCPS []  Parks Neptune, Pharm.D. []  Alycia Rossetti, Pharm.D., BCPS []  [place], Pharm.D., BCPS, AAHIVP []  Legrand Como, Pharm.D., BCPS, AAHIVP []  Milus Glazier, Pharm.D. []  Stephens November, Pharm.D.  Positive urine culture E. coli Treated with cefpodoxime, organism sensitive to the same and no further patient follow-up is required at this time.  Hazle Nordmann 02/09/2015, 10:31 AM

## 2015-02-13 ENCOUNTER — Other Ambulatory Visit: Payer: Self-pay | Admitting: Hematology and Oncology

## 2015-02-15 ENCOUNTER — Other Ambulatory Visit (HOSPITAL_BASED_OUTPATIENT_CLINIC_OR_DEPARTMENT_OTHER): Payer: Medicaid Other

## 2015-02-15 ENCOUNTER — Other Ambulatory Visit: Payer: Self-pay | Admitting: Hematology and Oncology

## 2015-02-15 ENCOUNTER — Telehealth: Payer: Self-pay | Admitting: Hematology and Oncology

## 2015-02-15 ENCOUNTER — Ambulatory Visit (HOSPITAL_BASED_OUTPATIENT_CLINIC_OR_DEPARTMENT_OTHER): Payer: Medicaid Other

## 2015-02-15 VITALS — BP 131/61 | HR 68 | Temp 98.8°F

## 2015-02-15 DIAGNOSIS — D631 Anemia in chronic kidney disease: Secondary | ICD-10-CM

## 2015-02-15 DIAGNOSIS — N189 Chronic kidney disease, unspecified: Secondary | ICD-10-CM

## 2015-02-15 LAB — CBC & DIFF AND RETIC
BASO%: 0.3 % (ref 0.0–2.0)
Basophils Absolute: 0 10*3/uL (ref 0.0–0.1)
EOS%: 1.8 % (ref 0.0–7.0)
Eosinophils Absolute: 0.1 10*3/uL (ref 0.0–0.5)
HCT: 29.2 % — ABNORMAL LOW (ref 38.4–49.9)
HGB: 9.5 g/dL — ABNORMAL LOW (ref 13.0–17.1)
IMMATURE RETIC FRACT: 6 % (ref 3.00–10.60)
LYMPH%: 27.3 % (ref 14.0–49.0)
MCH: 24.1 pg — AB (ref 27.2–33.4)
MCHC: 32.5 g/dL (ref 32.0–36.0)
MCV: 74.1 fL — AB (ref 79.3–98.0)
MONO#: 0.5 10*3/uL (ref 0.1–0.9)
MONO%: 6.4 % (ref 0.0–14.0)
NEUT%: 64.2 % (ref 39.0–75.0)
NEUTROS ABS: 4.6 10*3/uL (ref 1.5–6.5)
PLATELETS: 172 10*3/uL (ref 140–400)
RBC: 3.94 10*6/uL — AB (ref 4.20–5.82)
RDW: 15.5 % — ABNORMAL HIGH (ref 11.0–14.6)
Retic %: 1.91 % — ABNORMAL HIGH (ref 0.80–1.80)
Retic Ct Abs: 75.25 10*3/uL (ref 34.80–93.90)
WBC: 7.2 10*3/uL (ref 4.0–10.3)
lymph#: 2 10*3/uL (ref 0.9–3.3)

## 2015-02-15 MED ORDER — DARBEPOETIN ALFA 200 MCG/0.4ML IJ SOSY
200.0000 ug | PREFILLED_SYRINGE | Freq: Once | INTRAMUSCULAR | Status: AC
  Administered 2015-02-15: 200 ug via SUBCUTANEOUS
  Filled 2015-02-15: qty 0.4

## 2015-02-15 NOTE — Telephone Encounter (Signed)
Changed appt time on 1/26 per 1/12 pof.

## 2015-02-16 ENCOUNTER — Other Ambulatory Visit: Payer: Self-pay | Admitting: Family Medicine

## 2015-02-21 ENCOUNTER — Other Ambulatory Visit: Payer: Self-pay | Admitting: Family Medicine

## 2015-02-22 ENCOUNTER — Ambulatory Visit (INDEPENDENT_AMBULATORY_CARE_PROVIDER_SITE_OTHER): Payer: Medicaid Other | Admitting: Family Medicine

## 2015-02-22 ENCOUNTER — Encounter: Payer: Self-pay | Admitting: Family Medicine

## 2015-02-22 VITALS — BP 159/72 | HR 69 | Temp 98.0°F | Ht 68.0 in | Wt 224.6 lb

## 2015-02-22 DIAGNOSIS — E1121 Type 2 diabetes mellitus with diabetic nephropathy: Secondary | ICD-10-CM

## 2015-02-22 DIAGNOSIS — L989 Disorder of the skin and subcutaneous tissue, unspecified: Secondary | ICD-10-CM

## 2015-02-22 HISTORY — DX: Disorder of the skin and subcutaneous tissue, unspecified: L98.9

## 2015-02-22 LAB — GLUCOSE, CAPILLARY: Glucose-Capillary: 376 mg/dL — ABNORMAL HIGH (ref 65–99)

## 2015-02-22 MED ORDER — INSULIN ASPART 100 UNIT/ML FLEXPEN: 15.0000 [IU] | PEN_INJECTOR | Freq: Every day | SUBCUTANEOUS | Status: DC

## 2015-02-22 MED ORDER — INSULIN GLARGINE 100 UNIT/ML SOLOSTAR PEN: PEN_INJECTOR | SUBCUTANEOUS | Status: DC

## 2015-02-22 NOTE — Progress Notes (Signed)
   HPI  CC: hyperglycemia Patient is here with complaints of hyperglycemia. He states that over the past 3 weeks he has had glucose levels ranging in the 400s fairly consistently. Denies any symptoms of vision changes headache nausea vomiting or confusion. He endorses good compliance with his medications at this time. Denies any fevers chills abdominal pain cough congestion or dysuria. He was recently treated for UTI at the beginning of the month however he has taken the antibiotics that were prescribed and has had symptomatic relief since that time. Patient endorses a diet which consists of bread, cake, potatoes, rice. No red flags symptoms of DKA.  Review of Systems   See HPI for ROS. All other systems reviewed and are negative.  Past medical history and social history reviewed and updated in the EMR as appropriate.  Objective: BP 159/72 mmHg  Pulse 69  Temp(Src) 98 F (36.7 C) (Oral)  Ht 5\' 8"  (1.727 m)  Wt 224 lb 9.6 oz (101.878 kg)  BMI 34.16 kg/m2 Gen: NAD, alert, oriented, cooperative, and pleasant. HEENT: NCAT, EOMI, PERRL, 1 cm irregularly shaped and colored melanic skin lesion at the tip of his nose with scabbing present. No LAD CV: RRR, no murmur Resp: CTAB, no wheezes, non-labored Abd: SNTND, BS present, no guarding or organomegaly   Assessment and plan:  DM (diabetes mellitus), type 2 with renal complications ([place]) Patient is here with complaints of hyperglycemia secondary to poorly controlled diabetes. Endorses good compliance with medication regimen. Currently taking 45 units of Lantus twice a day. Most recent A1c was 12.0 at the beginning of this month. - I have added 1 dose of NovoLog 15 units with his largest meal. - I've increased his Lantus dose to 50 units twice a day. - I have asked that he make an appointment with Dr. Valentina Lucks in 2 weeks to further manage his insulin regimen. - Discussion was made about signs and symptoms of DKA and hypoglycemia.  Skin lesion of  face Melanic skin lesion noted at the tip of his nose. Lesion is approximately 1 cm in diameter and irregularly shaped and colored. There appears to be a scab at the center of the lesion which patient reports it has bled in the past. Patient states that this lesion has been present for "quite some time", however he was unable to say if it had grown recently and with the current presentation this lesion likely warrants further workup. - Referral to dermatology placed.    Orders Placed This Encounter  Procedures  . Glucose, capillary  . Ambulatory referral to Dermatology    Referral Priority:  Routine    Referral Type:  Consultation    Referral Reason:  Specialty Services Required    Requested Specialty:  Dermatology    Number of Visits Requested:  1  . Glucose (CBG)    Meds ordered this encounter  Medications  . Insulin Glargine (LANTUS SOLOSTAR) 100 UNIT/ML Solostar Pen    Sig: INJECT 50 UNITS INTO THE SKIN TWICE DAILY    Dispense:  15 mL    Refill:  0  . insulin aspart (NOVOLOG FLEXPEN) 100 UNIT/ML FlexPen    Sig: Inject 15 Units into the skin daily.    Dispense:  15 mL    Refill:  0     Elberta Leatherwood, MD,MS,  PGY2 02/22/2015 12:20 PM

## 2015-02-22 NOTE — Assessment & Plan Note (Signed)
Patient is here with complaints of hyperglycemia secondary to poorly controlled diabetes. Endorses good compliance with medication regimen. Currently taking 45 units of Lantus twice a day. Most recent A1c was 12.0 at the beginning of this month. - I have added 1 dose of NovoLog 15 units with his largest meal. - I've increased his Lantus dose to 50 units twice a day. - I have asked that he make an appointment with Dr. Valentina Lucks in 2 weeks to further manage his insulin regimen. - Discussion was made about signs and symptoms of DKA and hypoglycemia.

## 2015-02-22 NOTE — Progress Notes (Signed)
Medication Samples have been provided to the patient.  Drug name: Novolog  Qty: 1 pen   LOT: [id number]  Exp.Date: 02/03/2016  The patient has been instructed regarding the correct time, dose, and frequency of taking this medication, including desired effects and most common side effects.   Bennye Alm, PharmD Pharmacy Resident 3:37 PM 02/22/2015

## 2015-02-22 NOTE — Patient Instructions (Addendum)
It was a pleasure seeing you today in our clinic. Today we discussed your high blood sugars. Here is the treatment plan we have discussed and agreed upon together:   - I've increased your Lantus dose to 50 units twice a day. - I have added a new short acting insulin to your medication regimen. This medication is called NovoLog. Take this insulin once a day with your largest meal. At that time take 15 units. - Try to refrain from sugary and starchy foods like candies, sweets, breads, pastas, and potatoes. - I've placed a referral for you to see a dermatologist for the skin lesion on your nose. - I would like for you to be seen in our office, by Dr. Valentina Lucks, in 2 weeks.

## 2015-02-22 NOTE — Assessment & Plan Note (Signed)
Melanic skin lesion noted at the tip of his nose. Lesion is approximately 1 cm in diameter and irregularly shaped and colored. There appears to be a scab at the center of the lesion which patient reports it has bled in the past. Patient states that this lesion has been present for "quite some time", however he was unable to say if it had grown recently and with the current presentation this lesion likely warrants further workup. - Referral to dermatology placed.

## 2015-02-23 ENCOUNTER — Other Ambulatory Visit: Payer: Self-pay | Admitting: Family Medicine

## 2015-03-01 ENCOUNTER — Ambulatory Visit (HOSPITAL_BASED_OUTPATIENT_CLINIC_OR_DEPARTMENT_OTHER): Payer: Medicaid Other | Admitting: Hematology and Oncology

## 2015-03-01 ENCOUNTER — Other Ambulatory Visit: Payer: Medicaid Other

## 2015-03-01 ENCOUNTER — Ambulatory Visit: Payer: Medicaid Other | Admitting: Hematology and Oncology

## 2015-03-01 ENCOUNTER — Ambulatory Visit (HOSPITAL_BASED_OUTPATIENT_CLINIC_OR_DEPARTMENT_OTHER): Payer: Medicaid Other

## 2015-03-01 ENCOUNTER — Ambulatory Visit: Payer: Medicaid Other

## 2015-03-01 ENCOUNTER — Encounter: Payer: Self-pay | Admitting: Hematology and Oncology

## 2015-03-01 ENCOUNTER — Telehealth: Payer: Self-pay | Admitting: Hematology and Oncology

## 2015-03-01 ENCOUNTER — Other Ambulatory Visit (HOSPITAL_BASED_OUTPATIENT_CLINIC_OR_DEPARTMENT_OTHER): Payer: Medicaid Other

## 2015-03-01 VITALS — BP 152/77 | HR 55 | Temp 98.1°F | Resp 18 | Wt 227.8 lb

## 2015-03-01 DIAGNOSIS — N189 Chronic kidney disease, unspecified: Secondary | ICD-10-CM

## 2015-03-01 DIAGNOSIS — D472 Monoclonal gammopathy: Secondary | ICD-10-CM | POA: Diagnosis not present

## 2015-03-01 DIAGNOSIS — D631 Anemia in chronic kidney disease: Secondary | ICD-10-CM | POA: Diagnosis not present

## 2015-03-01 DIAGNOSIS — I10 Essential (primary) hypertension: Secondary | ICD-10-CM

## 2015-03-01 LAB — CBC & DIFF AND RETIC
BASO%: 0.1 % (ref 0.0–2.0)
Basophils Absolute: 0 10*3/uL (ref 0.0–0.1)
EOS ABS: 0.1 10*3/uL (ref 0.0–0.5)
EOS%: 1.8 % (ref 0.0–7.0)
HCT: 31.1 % — ABNORMAL LOW (ref 38.4–49.9)
HEMOGLOBIN: 9.9 g/dL — AB (ref 13.0–17.1)
Immature Retic Fract: 9.4 % (ref 3.00–10.60)
LYMPH#: 2 10*3/uL (ref 0.9–3.3)
LYMPH%: 26.1 % (ref 14.0–49.0)
MCH: 23.3 pg — AB (ref 27.2–33.4)
MCHC: 31.8 g/dL — AB (ref 32.0–36.0)
MCV: 73.3 fL — ABNORMAL LOW (ref 79.3–98.0)
MONO#: 0.6 10*3/uL (ref 0.1–0.9)
MONO%: 7.2 % (ref 0.0–14.0)
NEUT%: 64.8 % (ref 39.0–75.0)
NEUTROS ABS: 5.1 10*3/uL (ref 1.5–6.5)
Platelets: 212 10*3/uL (ref 140–400)
RBC: 4.24 10*6/uL (ref 4.20–5.82)
RDW: 15.7 % — AB (ref 11.0–14.6)
RETIC %: 1.33 % (ref 0.80–1.80)
Retic Ct Abs: 56.39 10*3/uL (ref 34.80–93.90)
WBC: 7.8 10*3/uL (ref 4.0–10.3)

## 2015-03-01 MED ORDER — DARBEPOETIN ALFA 200 MCG/0.4ML IJ SOSY
200.0000 ug | PREFILLED_SYRINGE | Freq: Once | INTRAMUSCULAR | Status: AC
  Administered 2015-03-01: 200 ug via SUBCUTANEOUS
  Filled 2015-03-01: qty 0.4

## 2015-03-01 NOTE — Assessment & Plan Note (Addendum)
The patient was originally referred here because of detectable M spike in his blood which subsequently become undetectable. 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 Extensive workup last week did not take up any abnormal M spike. Although his serum light chains were high, this is likely related to renal disease and the ratio was just barely above the upper limit of normal. For this, I would recommend repeat follow-up in about 6 months, due in June 2017 In the future, I would consider adding 24-hour urine collection for this. In any case, I do not believe his renal failure and anemia is related to multiple myeloma.  I do not feel that he would require bone marrow biopsy for this.

## 2015-03-01 NOTE — Telephone Encounter (Signed)
Pt confirmed labs/ov/inj per 01/26 POF, gave pt AVS and Calendar... KJ

## 2015-03-01 NOTE — Assessment & Plan Note (Signed)
The patient has a lack of reticulocytosis with relatively poor response with erythropoietin production. 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 He has responded well to ESA. We will continue 200 micrograms every 2 weeks to keep hemoglobin greater than 11

## 2015-03-01 NOTE — Assessment & Plan Note (Signed)
The patient has poorly controlled hypertension. He brought with him a book that documented his blood pressure monitoring at home, which showed that his systolic blood pressure ranged from 140-160. I will defer to his other physicians for blood pressure medication management.

## 2015-03-01 NOTE — Progress Notes (Signed)
[facility] OFFICE PROGRESS NOTE  Patient Care Team: Rosemarie Ax, MD as PCP - General [name], MD as Consulting Physician (Nephrology)  SUMMARY OF ONCOLOGIC HISTORY:  Leonard [name] 65 y.o. male is here because of progressive anemia, renal failure and MGUS. I reviewed reports from his nephrologist office. The patient have significant peripheral vascular disease, diabetes and hypertension. Early this year, he had baseline creatinine of around 1.3 and subsequently developed heart failure. He was placed on aggressive medication/regimen and subsequently was noted to have progressive renal failure and anemia. According to his blood work dated 01/03/2015, white blood cell count is at 6.6, hemoglobin 7.1, platelet count of 153, creatinine of 2.4, calcium of 8.9, MCV 76, iron saturation 15%, ferritin 180, serum vitamin B-12 of 360. Serum protein electrophoresis initially detected M spike but subsequently not quantifiable. Urine immunofixation showed Bence-Jones Proteinuria. Echocardiogram show preserved ejection fraction 55-60% but with evidence of diastolic heart failure. He denies history of abnormal bone pain or bone fracture. Patient denies history of recurrent infection or atypical infections such as shingles of meningitis. Denies chills, night sweats, anorexia or abnormal weight loss. He complained of fatigue and shortness of breath on minimal exertion. The patient denies any recent signs or symptoms of bleeding such as spontaneous hematuria or hematochezia. He has occasional epistaxis when he blows his nose. He does not have spontaneous epistaxis Evaluation in December 2016 show he had anemia chronic disease and MGUS  On 02/01/2015, he is started on Aranesp, 200 g every 2 weeks to keep hemoglobin greater than 11  INTERVAL HISTORY: Please see below for problem oriented charting.  he feels well. He had recent urinary tract infection, resolved with oral  antibiotics. He tolerated the injection treatment well. The patient denies any recent signs or symptoms of bleeding such as spontaneous epistaxis, hematuria or hematochezia.   REVIEW OF SYSTEMS:   Constitutional: Denies fevers, chills or abnormal weight loss Eyes: Denies blurriness of vision Ears, nose, mouth, throat, and face: Denies mucositis or sore throat Respiratory: Denies cough, dyspnea or wheezes Cardiovascular: Denies palpitation, chest discomfort or lower extremity swelling Gastrointestinal:  Denies nausea, heartburn or change in bowel habits Skin: Denies abnormal skin rashes Lymphatics: Denies new lymphadenopathy or easy bruising Neurological:Denies numbness, tingling or new weaknesses Behavioral/Psych: Mood is stable, no new changes  All other systems were reviewed with the patient and are negative.  I have reviewed the past medical history, past surgical history, social history and family history with the patient and they are unchanged from previous note.  ALLERGIES:  has No Known Allergies.  MEDICATIONS:  Current Outpatient Prescriptions  Medication Sig Dispense Refill  . amLODipine (NORVASC) 10 MG tablet Take 1 tablet (10 mg total) by mouth daily. 90 tablet 1  . aspirin 81 MG tablet Take 1 tablet (81 mg total) by mouth daily.    Marland Kitchen atorvastatin (LIPITOR) 40 MG tablet TAKE 1 TABLET BY MOUTH EVERY DAY AT 6 PM 90 tablet 1  . carvedilol (COREG) 25 MG tablet TAKE 2 TABLETS BY MOUTH TWICE DAILY WITH A MEAL 60 tablet 3  . cloNIDine (CATAPRES) 0.2 MG tablet Take 1 tablet (0.2 mg total) by mouth 2 (two) times daily. 60 tablet 6  . furosemide (LASIX) 40 MG tablet 1 and 1/2 tablet daily (Patient taking differently: Take 60 mg by mouth 2 (two) times daily. 1 and 1/2 tablet daily) 45 tablet 5  . glipiZIDE (GLUCOTROL) 10 MG tablet TAKE 1 TABLET BY MOUTH TWICE DAILY BEFORE  A MEAL 120 tablet 0  . hydrochlorothiazide (HYDRODIURIL) 25 MG tablet Take 0.5 tablets (12.5 mg total) by mouth  daily. 90 tablet 1  . insulin aspart (NOVOLOG FLEXPEN) 100 UNIT/ML FlexPen Inject 15 Units into the skin daily. 15 mL 0  . Insulin Glargine (LANTUS SOLOSTAR) 100 UNIT/ML Solostar Pen INJECT 50 UNITS INTO THE SKIN TWICE DAILY 15 mL 0  . spironolactone (ALDACTONE) 50 MG tablet Take 1 tablet (50 mg total) by mouth daily. (Patient taking differently: Take 25 mg by mouth daily. ) 90 tablet 1   No current facility-administered medications for this visit.    PHYSICAL EXAMINATION: ECOG PERFORMANCE STATUS: 0 - Asymptomatic  Filed Vitals:   03/01/15 1045  BP: 152/77  Pulse: 55  Temp: 98.1 F (36.7 C)  Resp: 18   Filed Weights   03/01/15 1045  Weight: 227 lb 12.8 oz (103.329 kg)    GENERAL:alert, no distress and comfortable SKIN: skin color, texture, turgor are normal, no rashes or significant lesions EYES: normal, Conjunctiva are pink and non-injected, sclera clear Musculoskeletal:no cyanosis of digits and no clubbing  NEURO: alert & oriented x 3 with fluent speech, no focal motor/sensory deficits  LABORATORY DATA:  I have reviewed the data as listed    Component Value Date/Time   NA 132* 02/05/2015 1345   NA 138 01/15/2015 1416   K 4.6 02/05/2015 1345   K 4.2 01/15/2015 1416   CL 96* 02/05/2015 1345   CO2 25 02/05/2015 1345   CO2 25 01/15/2015 1416   GLUCOSE 486* 02/05/2015 1345   GLUCOSE 344* 01/15/2015 1416   BUN 59* 02/05/2015 1345   BUN 50.4* 01/15/2015 1416   CREATININE 2.50* 02/05/2015 1345   CREATININE 2.7* 01/15/2015 1416   CREATININE 2.27* 12/12/2014 1019   CALCIUM 9.2 02/05/2015 1345   CALCIUM 9.7 01/15/2015 1416   PROT 7.3 01/15/2015 1416   PROT 6.5 02/10/2014 1045   ALBUMIN 4.3 01/15/2015 1416   ALBUMIN 3.6 02/10/2014 1045   AST 13 01/15/2015 1416   AST 26 02/10/2014 1045   ALT 13 01/15/2015 1416   ALT 30 02/10/2014 1045   ALKPHOS 83 01/15/2015 1416   ALKPHOS 73 02/10/2014 1045   BILITOT 0.35 01/15/2015 1416   BILITOT 0.5 02/10/2014 1045   GFRNONAA 26*  02/05/2015 1345   GFRNONAA 31* 11/28/2014 1440   GFRAA 30* 02/05/2015 1345   GFRAA 36* 11/28/2014 1440    No results found for: SPEP, UPEP  Lab Results  Component Value Date   WBC 7.8 03/01/2015   NEUTROABS 5.1 03/01/2015   HGB 9.9* 03/01/2015   HCT 31.1* 03/01/2015   MCV 73.3* 03/01/2015   PLT 212 03/01/2015      Chemistry      Component Value Date/Time   NA 132* 02/05/2015 1345   NA 138 01/15/2015 1416   K 4.6 02/05/2015 1345   K 4.2 01/15/2015 1416   CL 96* 02/05/2015 1345   CO2 25 02/05/2015 1345   CO2 25 01/15/2015 1416   BUN 59* 02/05/2015 1345   BUN 50.4* 01/15/2015 1416   CREATININE 2.50* 02/05/2015 1345   CREATININE 2.7* 01/15/2015 1416   CREATININE 2.27* 12/12/2014 1019      Component Value Date/Time   CALCIUM 9.2 02/05/2015 1345   CALCIUM 9.7 01/15/2015 1416   ALKPHOS 83 01/15/2015 1416   ALKPHOS 73 02/10/2014 1045   AST 13 01/15/2015 1416   AST 26 02/10/2014 1045   ALT 13 01/15/2015 1416   ALT 30 02/10/2014 1045  BILITOT 0.35 01/15/2015 1416   BILITOT 0.5 02/10/2014 1045      ASSESSMENT & PLAN:  Anemia in chronic kidney disease  The patient has a lack of reticulocytosis with relatively poor response with erythropoietin production. 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 He has responded well to ESA. We will continue 200 micrograms every 2 weeks to keep hemoglobin greater than 11  MGUS (monoclonal gammopathy of unknown significance)  The patient was originally referred here because of detectable M spike in his blood which subsequently become undetectable. 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 Extensive workup last week did not take up any abnormal M spike. Although his serum light chains were high, this is likely related to renal disease and the ratio was just barely above the upper limit of normal. For this, I would recommend repeat follow-up in about 6 months, due in June  2017 In the future, I would consider adding 24-hour urine collection for this. In any case, I do not believe his renal failure and anemia is related to multiple myeloma.  I do not feel that he would require bone marrow biopsy for this.  Essential hypertension, benign  The patient has poorly controlled hypertension. He brought with him a book that documented his blood pressure monitoring at home, which showed that his systolic blood pressure ranged from 140-160. I will defer to his other physicians for blood pressure medication management.      No orders of the defined types were placed in this encounter.   All questions were answered. The patient knows to call the clinic with any problems, questions or concerns. No barriers to learning was detected. I spent 15 minutes counseling the patient face to face. The total time spent in the appointment was 20 minutes and more than 50% was on counseling and review of test results     [facility], [place], MD 03/01/2015 11:10 AM

## 2015-03-02 ENCOUNTER — Telehealth: Payer: Self-pay | Admitting: Family Medicine

## 2015-03-02 NOTE — Telephone Encounter (Signed)
Will forward to MD to make him aware. Yadiel Aubry,CMA  

## 2015-03-02 NOTE — Telephone Encounter (Signed)
[facility] called and wanted the doctor to know that they are going to be making a home visit with the patient on 03/06/15 to see if the can help get his blood sugar levels under control and teach him so more on taking his medications. jw

## 2015-03-13 ENCOUNTER — Telehealth: Payer: Self-pay | Admitting: Family Medicine

## 2015-03-13 NOTE — Telephone Encounter (Signed)
[facility] called with the results of their home visit with the patient. The patients A1C was 12 . The patient is not being consisted with all his medications. They are switching him to [facility] so that they can help manage his medications. They are setting him up with transportation and a dietician to help him eat better and goal of loosing 5 lbs a month by walking. jw

## 2015-03-14 ENCOUNTER — Other Ambulatory Visit: Payer: Self-pay | Admitting: *Deleted

## 2015-03-14 DIAGNOSIS — E1121 Type 2 diabetes mellitus with diabetic nephropathy: Secondary | ICD-10-CM

## 2015-03-14 DIAGNOSIS — I10 Essential (primary) hypertension: Secondary | ICD-10-CM

## 2015-03-15 ENCOUNTER — Ambulatory Visit (HOSPITAL_BASED_OUTPATIENT_CLINIC_OR_DEPARTMENT_OTHER): Payer: Medicaid Other

## 2015-03-15 ENCOUNTER — Other Ambulatory Visit: Payer: Self-pay | Admitting: Family Medicine

## 2015-03-15 ENCOUNTER — Other Ambulatory Visit (HOSPITAL_BASED_OUTPATIENT_CLINIC_OR_DEPARTMENT_OTHER): Payer: Medicaid Other

## 2015-03-15 VITALS — BP 144/70 | HR 68 | Temp 98.2°F

## 2015-03-15 DIAGNOSIS — N189 Chronic kidney disease, unspecified: Secondary | ICD-10-CM

## 2015-03-15 DIAGNOSIS — E1121 Type 2 diabetes mellitus with diabetic nephropathy: Secondary | ICD-10-CM

## 2015-03-15 DIAGNOSIS — D631 Anemia in chronic kidney disease: Secondary | ICD-10-CM

## 2015-03-15 LAB — CBC & DIFF AND RETIC
BASO%: 0.5 % (ref 0.0–2.0)
Basophils Absolute: 0 10*3/uL (ref 0.0–0.1)
EOS%: 2.7 % (ref 0.0–7.0)
Eosinophils Absolute: 0.2 10*3/uL (ref 0.0–0.5)
HCT: 31.6 % — ABNORMAL LOW (ref 38.4–49.9)
HGB: 10 g/dL — ABNORMAL LOW (ref 13.0–17.1)
IMMATURE RETIC FRACT: 7.9 % (ref 3.00–10.60)
LYMPH#: 2.2 10*3/uL (ref 0.9–3.3)
LYMPH%: 37.8 % (ref 14.0–49.0)
MCH: 23.2 pg — ABNORMAL LOW (ref 27.2–33.4)
MCHC: 31.6 g/dL — AB (ref 32.0–36.0)
MCV: 73.3 fL — AB (ref 79.3–98.0)
MONO#: 0.4 10*3/uL (ref 0.1–0.9)
MONO%: 7.2 % (ref 0.0–14.0)
NEUT%: 51.8 % (ref 39.0–75.0)
NEUTROS ABS: 3 10*3/uL (ref 1.5–6.5)
Platelets: 199 10*3/uL (ref 140–400)
RBC: 4.31 10*6/uL (ref 4.20–5.82)
RDW: 16.2 % — ABNORMAL HIGH (ref 11.0–14.6)
RETIC CT ABS: 87.49 10*3/uL (ref 34.80–93.90)
Retic %: 2.03 % — ABNORMAL HIGH (ref 0.80–1.80)
WBC: 5.8 10*3/uL (ref 4.0–10.3)

## 2015-03-15 MED ORDER — INSULIN ASPART 100 UNIT/ML FLEXPEN: 15.0000 [IU] | PEN_INJECTOR | Freq: Every day | SUBCUTANEOUS | Status: DC

## 2015-03-15 MED ORDER — DARBEPOETIN ALFA 200 MCG/0.4ML IJ SOSY
200.0000 ug | PREFILLED_SYRINGE | Freq: Once | INTRAMUSCULAR | Status: AC
  Administered 2015-03-15: 200 ug via SUBCUTANEOUS
  Filled 2015-03-15: qty 0.4

## 2015-03-15 MED ORDER — GLIPIZIDE 10 MG PO TABS: ORAL_TABLET | ORAL | Status: DC

## 2015-03-15 MED ORDER — INSULIN GLARGINE 100 UNIT/ML SOLOSTAR PEN: PEN_INJECTOR | SUBCUTANEOUS | Status: DC

## 2015-03-15 MED ORDER — SPIRONOLACTONE 50 MG PO TABS: 25.0000 mg | ORAL_TABLET | Freq: Every day | ORAL | Status: DC

## 2015-03-15 MED ORDER — AMLODIPINE BESYLATE 10 MG PO TABS: 10.0000 mg | ORAL_TABLET | Freq: Every day | ORAL | Status: DC

## 2015-03-15 NOTE — Telephone Encounter (Signed)
Spoke with Cloyde Reams at [facility] [phone]) States patient was given sample of novolog flexpen but is now in need of refill. Forwarded to PCP Velora Heckler, RN

## 2015-03-15 NOTE — Telephone Encounter (Signed)
[facility]---has questions about novolog flex pen

## 2015-03-19 MED ORDER — INSULIN ASPART 100 UNIT/ML FLEXPEN: 15.0000 [IU] | PEN_INJECTOR | Freq: Every day | SUBCUTANEOUS | Status: DC

## 2015-03-19 NOTE — Telephone Encounter (Signed)
Rx resent to [facility].  Derl Barrow, RN

## 2015-03-19 NOTE — Telephone Encounter (Signed)
Leonard [name] called again about the refill. Pt needs REFILL not a sample of the novolg

## 2015-03-19 NOTE — Addendum Note (Signed)
Addended by: Derl Barrow on: 03/19/2015 02:08 PM   Modules accepted: Orders

## 2015-03-23 ENCOUNTER — Encounter: Payer: Self-pay | Admitting: Pharmacist

## 2015-03-23 ENCOUNTER — Ambulatory Visit (INDEPENDENT_AMBULATORY_CARE_PROVIDER_SITE_OTHER): Payer: Medicaid Other | Admitting: Pharmacist

## 2015-03-23 VITALS — BP 158/81 | HR 69 | Ht 70.47 in | Wt 228.0 lb

## 2015-03-23 DIAGNOSIS — I10 Essential (primary) hypertension: Secondary | ICD-10-CM

## 2015-03-23 DIAGNOSIS — E1121 Type 2 diabetes mellitus with diabetic nephropathy: Secondary | ICD-10-CM

## 2015-03-23 DIAGNOSIS — I5032 Chronic diastolic (congestive) heart failure: Secondary | ICD-10-CM

## 2015-03-23 DIAGNOSIS — E785 Hyperlipidemia, unspecified: Secondary | ICD-10-CM

## 2015-03-23 LAB — BASIC METABOLIC PANEL
BUN: 59 mg/dL — AB (ref 7–25)
CALCIUM: 9.2 mg/dL (ref 8.6–10.3)
CO2: 26 mmol/L (ref 20–31)
Chloride: 95 mmol/L — ABNORMAL LOW (ref 98–110)
Creat: 2.75 mg/dL — ABNORMAL HIGH (ref 0.70–1.25)
GLUCOSE: 356 mg/dL — AB (ref 65–99)
POTASSIUM: 4.1 mmol/L (ref 3.5–5.3)
SODIUM: 134 mmol/L — AB (ref 135–146)

## 2015-03-23 MED ORDER — AMLODIPINE BESYLATE 10 MG PO TABS: 10.0000 mg | ORAL_TABLET | Freq: Every day | ORAL | Status: DC

## 2015-03-23 MED ORDER — ASPIRIN 81 MG PO TABS: 81.0000 mg | ORAL_TABLET | Freq: Every day | ORAL | Status: DC

## 2015-03-23 MED ORDER — INSULIN ASPART 100 UNIT/ML FLEXPEN: 20.0000 [IU] | PEN_INJECTOR | Freq: Two times a day (BID) | SUBCUTANEOUS | Status: DC

## 2015-03-23 MED ORDER — FUROSEMIDE 40 MG PO TABS: 60.0000 mg | ORAL_TABLET | Freq: Two times a day (BID) | ORAL | Status: DC

## 2015-03-23 MED ORDER — ATORVASTATIN CALCIUM 40 MG PO TABS: ORAL_TABLET | ORAL | Status: DC

## 2015-03-23 MED ORDER — CARVEDILOL 25 MG PO TABS: 25.0000 mg | ORAL_TABLET | Freq: Two times a day (BID) | ORAL | Status: DC

## 2015-03-23 MED ORDER — INSULIN GLARGINE 100 UNIT/ML SOLOSTAR PEN: 60.0000 [IU] | PEN_INJECTOR | Freq: Two times a day (BID) | SUBCUTANEOUS | Status: DC

## 2015-03-23 MED ORDER — CLONIDINE HCL 0.2 MG PO TABS: 0.2000 mg | ORAL_TABLET | Freq: Two times a day (BID) | ORAL | Status: DC

## 2015-03-23 MED ORDER — HYDROCHLOROTHIAZIDE 25 MG PO TABS: 12.5000 mg | ORAL_TABLET | Freq: Every day | ORAL | Status: DC

## 2015-03-23 NOTE — Patient Instructions (Addendum)
Thank you for coming today!  Start taking lantus 60 units twice daily (morning and night).  Start taking novolog 20 units twice daily with breakfast and dinner.  Your other medications have been sorted into your pill box. Remember to take an additional clonidine tablet from your BOTTLE at lunch time.  Continue to take you furosemide at lunch time as well.   Follow up in pharmacy clinic in one week.

## 2015-03-23 NOTE — Progress Notes (Signed)
S:    Patient arrives in good spirits.  Presents for diabetes evaluation, education, and management at the request of Dr. Alease Frame. Patient was referred on 02/22/2015.  Patient was last seen by Primary Care Provider on 02/22/2015.   Patient reports Diabetes was diagnosed approx 13 years ago.   Patient reports adherence with medications, although he states he is inconsistent. Current diabetes medications include: Glipizide 10mg  BID with meals, lantus 50 units BID, novolog 15 units with largest meal. Current hypertension medications include: amlodipine 10mg  daily, HCTZ 12.5mg  daily, carvedilol 25mg  BID, clonidine 0.2mg  TID, furosemide 60mg  BID and spironolactone 50mg  daily  Patient denies hypoglycemic events.  Patient reported dietary habits: Eats 3 meals/day. Dinner largest. Breakfast: eggs, bacon, grits Lunch: sandwich, beans Dinner: steak, potatoes, rice Snacks: cookies Drinks: diet soda  Patient reported exercise habits: Tries to walk on occasion. 3x a week down his block   Patient reports nocturia.  Patient denies neuropathy. Patient reports visual changes. Patient denies self foot exams.    O:  Lab Results  Component Value Date   HGBA1C 12.0* 02/05/2015   Filed Vitals:   03/23/15 1027  BP: 158/81  Pulse: 69    Home fasting CBG: Avg 304 2 hour post-prandial: Avg high 200s to high 300s   A/P: Diabetes longstanding of approximately 13 years currently uncontrolled. Patient denies hypoglycemic events and is able to verbalize appropriate hypoglycemia management plan. Control is suboptimal due to dietary indiscretion and questionable compliance. Patient reports adherence with medication, although admits he is very inconsistent. Diet high in fat and carbohydrates. Tries to walk at least 3x weekly down to the end of his street. Most recent A1c 12.0% with and avg BG of 298 per blood glucose meter. Increased lantus to 60 units BID and novolog to 20 units BID with breakfast and  dinner. Glipizide discontinued; likely providing little benefit if any at this point. Will consider addition of tanzeum at follow up visit.   Longstanding HTN remains uncontrolled despite multiple medications. SCr has continued to trend upward over the past year with consistent values in the 2.1-2.3 range. Most recent K+ 4.6. Concern with spironolactone use in this degree of renal dysfunction. Will temporarily discontinue spironolactone today pending BMP today. Will re-evaluate BP management at follow up.  Pt given a medication box to help with adherence issues. All medications sorted and discontinued/expired medications destroyed. Pt medications reordered and sent to [facility] pharmacy in preparation for transition to delivery service. Discussed medication changes at length with patient.   Written patient instructions provided.  Total time in face to face counseling 35 minutes.  Follow up in Pharmacist Clinic Visit in one week.   Patient seen with Phillis Knack, PharmD Candidate, and Stephens November, PharmD Resident.

## 2015-03-23 NOTE — Assessment & Plan Note (Signed)
Diabetes longstanding of approximately 13 years currently uncontrolled. Patient denies hypoglycemic events and is able to verbalize appropriate hypoglycemia management plan. Control is suboptimal due to dietary indiscretion and questionable compliance. Patient reports adherence with medication, although admits he is very inconsistent. Diet high in fat and carbohydrates. Tries to walk at least 3x weekly down to the end of his street. Most recent A1c 12.0% with and avg BG of 298 per blood glucose meter. Increased lantus to 60 units BID and novolog to 20 units BID with breakfast and dinner. Glipizide discontinued; likely providing little benefit if any at this point. Will consider addition of tanzeum at follow up visit.

## 2015-03-23 NOTE — Assessment & Plan Note (Signed)
Longstanding HTN remains uncontrolled despite multiple medications. SCr has continued to trend upward over the past year with consistent values in the 2.1-2.3 range. Most recent K+ 4.6. Concern with spironolactone use in this degree of renal dysfunction. Will temporarily discontinue spironolactone today pending BMP today. Will re-evaluate BP management at follow up.  Pt given a medication box to help with adherence issues. All medications sorted and discontinued/expired medications destroyed. Pt medications reordered and sent to [facility] pharmacy in preparation for transition to delivery service. Discussed medication changes at length with patient.

## 2015-03-23 NOTE — Addendum Note (Signed)
Addended by: Judieth Keens on: 03/23/2015 02:03 PM   Modules accepted: Orders

## 2015-03-23 NOTE — Progress Notes (Signed)
Patient ID: Leonard [name], male   DOB: [birth year], 65 y.o.   MRN: [id number] Reviewed: Agree with Dr. Graylin Shiver documentation and management.

## 2015-03-27 ENCOUNTER — Ambulatory Visit (INDEPENDENT_AMBULATORY_CARE_PROVIDER_SITE_OTHER): Payer: Medicaid Other | Admitting: Podiatry

## 2015-03-27 ENCOUNTER — Encounter: Payer: Self-pay | Admitting: Podiatry

## 2015-03-27 DIAGNOSIS — I739 Peripheral vascular disease, unspecified: Secondary | ICD-10-CM

## 2015-03-27 DIAGNOSIS — M79676 Pain in unspecified toe(s): Secondary | ICD-10-CM

## 2015-03-27 DIAGNOSIS — B351 Tinea unguium: Secondary | ICD-10-CM | POA: Diagnosis not present

## 2015-03-27 NOTE — Progress Notes (Signed)
Patient ID: Leonard [name], male   DOB: 1950-04-04, 65 y.o.   MRN: [id number] Complaint:  Visit Type: Patient returns to my office for continued preventative foot care services. Complaint: Patient states" my nails have grown long and thick and become painful to walk and wear shoes" Patient has been diagnosed with DM with amputation 4,5 rays right foot... The patient presents for preventative foot care services. No changes to ROS  Podiatric Exam: Vascular: dorsalis pedis and posterior tibial pulses are not  palpable bilateral. Capillary return is immediate.  Cold feet Absence of hair.  Sensorium: Absent  Semmes Weinstein monofilament test. Normal tactile sensation bilaterally. Nail Exam: Pt has thick disfigured discolored nails with subungual debris noted bilateral entire nail hallux through fifth toenails Ulcer Exam: There is no evidence of ulcer or pre-ulcerative changes or infection. Orthopedic Exam: Muscle tone and strength are WNL. No limitations in general ROM. No crepitus or effusions noted. Foot type and digits show no abnormalities. Bony prominences are unremarkable. Skin: No Porokeratosis. No infection or ulcers  Diagnosis:  Onychomycosis, , Pain in right toe, pain in left toes  Treatment & Plan Procedures and Treatment: Consent by patient was obtained for treatment procedures. The patient understood the discussion of treatment and procedures well. All questions were answered thoroughly reviewed. Debridement of mycotic and hypertrophic toenails, 1 through 5 bilateral and clearing of subungual debris. No ulceration, no infection noted. Discussed his failure to obtain shoes from [employer].  Melody talked with him about his problem. Return Visit-Office Procedure: Patient instructed to return to the office for a follow up visit 3 months for continued evaluation and treatment.    Gardiner Barefoot DPM

## 2015-03-29 ENCOUNTER — Ambulatory Visit (HOSPITAL_BASED_OUTPATIENT_CLINIC_OR_DEPARTMENT_OTHER): Payer: Medicaid Other

## 2015-03-29 ENCOUNTER — Other Ambulatory Visit (HOSPITAL_BASED_OUTPATIENT_CLINIC_OR_DEPARTMENT_OTHER): Payer: Medicaid Other

## 2015-03-29 VITALS — BP 152/63 | HR 69 | Temp 99.1°F

## 2015-03-29 DIAGNOSIS — N189 Chronic kidney disease, unspecified: Secondary | ICD-10-CM

## 2015-03-29 DIAGNOSIS — D631 Anemia in chronic kidney disease: Secondary | ICD-10-CM | POA: Diagnosis not present

## 2015-03-29 LAB — CBC & DIFF AND RETIC
BASO%: 0.2 % (ref 0.0–2.0)
BASOS ABS: 0 10*3/uL (ref 0.0–0.1)
EOS ABS: 0.2 10*3/uL (ref 0.0–0.5)
EOS%: 2.5 % (ref 0.0–7.0)
HEMATOCRIT: 29.5 % — AB (ref 38.4–49.9)
HEMOGLOBIN: 9.2 g/dL — AB (ref 13.0–17.1)
Immature Retic Fract: 6.5 % (ref 3.00–10.60)
LYMPH#: 1.6 10*3/uL (ref 0.9–3.3)
LYMPH%: 18.3 % (ref 14.0–49.0)
MCH: 22.7 pg — AB (ref 27.2–33.4)
MCHC: 31.2 g/dL — AB (ref 32.0–36.0)
MCV: 72.7 fL — AB (ref 79.3–98.0)
MONO#: 0.8 10*3/uL (ref 0.1–0.9)
MONO%: 8.9 % (ref 0.0–14.0)
NEUT#: 6 10*3/uL (ref 1.5–6.5)
NEUT%: 70.1 % (ref 39.0–75.0)
Platelets: 174 10*3/uL (ref 140–400)
RBC: 4.06 10*6/uL — AB (ref 4.20–5.82)
RDW: 16.6 % — AB (ref 11.0–14.6)
RETIC %: 1.69 % (ref 0.80–1.80)
RETIC CT ABS: 68.61 10*3/uL (ref 34.80–93.90)
WBC: 8.5 10*3/uL (ref 4.0–10.3)

## 2015-03-29 MED ORDER — DARBEPOETIN ALFA 200 MCG/0.4ML IJ SOSY
200.0000 ug | PREFILLED_SYRINGE | Freq: Once | INTRAMUSCULAR | Status: AC
  Administered 2015-03-29: 200 ug via SUBCUTANEOUS
  Filled 2015-03-29: qty 0.4

## 2015-03-30 ENCOUNTER — Ambulatory Visit (INDEPENDENT_AMBULATORY_CARE_PROVIDER_SITE_OTHER): Payer: Medicaid Other | Admitting: Pharmacist

## 2015-03-30 ENCOUNTER — Encounter: Payer: Self-pay | Admitting: Pharmacist

## 2015-03-30 VITALS — BP 191/74 | HR 70 | Wt 235.0 lb

## 2015-03-30 DIAGNOSIS — E1121 Type 2 diabetes mellitus with diabetic nephropathy: Secondary | ICD-10-CM

## 2015-03-30 DIAGNOSIS — I10 Essential (primary) hypertension: Secondary | ICD-10-CM

## 2015-03-30 NOTE — Progress Notes (Signed)
S:    Patient arrives in good spirits.  Presents for diabetes and blood pressure reevaluation, education, and management at the request of Dr. Raeford Razor.   Patient reports adherence with medications.  Current diabetes medications include:  Novolog and Lantus.  Current hypertension medications include:  Hyzaar, Clonidine, HCTZ, Amlodipine and Carvedilol.   Patient denies hypoglycemic events.  Patient reported exercise habits: Walking.   O:  Last 3 Office BP readings: BP Readings from Last 3 Encounters:  03/30/15 191/74  03/29/15 152/63  03/23/15 158/81    Lab Results  Component Value Date   HGBA1C 12.0* 02/05/2015   BMET    Component Value Date/Time   NA 134* 03/23/2015 1120   NA 138 01/15/2015 1416   K 4.1 03/23/2015 1120   K 4.2 01/15/2015 1416   CL 95* 03/23/2015 1120   CO2 26 03/23/2015 1120   CO2 25 01/15/2015 1416   GLUCOSE 356* 03/23/2015 1120   GLUCOSE 344* 01/15/2015 1416   BUN 59* 03/23/2015 1120   BUN 50.4* 01/15/2015 1416   CREATININE 2.75* 03/23/2015 1120   CREATININE 2.50* 02/05/2015 1345   CREATININE 2.7* 01/15/2015 1416   CALCIUM 9.2 03/23/2015 1120   CALCIUM 9.7 01/15/2015 1416   GFRNONAA 26* 02/05/2015 1345   GFRNONAA 31* 11/28/2014 1440   GFRAA 30* 02/05/2015 1345   GFRAA 36* 11/28/2014 1440      Home fasting CBG: 222 and 208 AM fastings reading the last two days.   2 hour post-prandial/random CBG:   Lower extremity edema - minimal/Trace    A/P: Diabetes longstanding currently slight improved control with recent insulin adjustments.  Patient denies hypoglycemic events and is able to verbalize appropriate hypoglycemia management plan. Patient reports adherence with medication. Control is suboptimal due to sedentary lifestyle and dietary indiscretion.  Continued basal insulin Lantus (insulin glargine) 60units BID andIncreased dose of rapid insulin Novolog (insulin aspart) to 25units BID (only eats two meals daily).  Consider adding Tanzeum at  next visit.   Hypertension longstanding currently worsened control likely related to dietary intake in salt (ate pork chops for dinner yesterday and breakfast today AND stopping Spironolactone at last visit.  Given SCr of > 2.5 the spironolactone was discontinued and we will likely not be able to use. Patient reports adherence with medication.    Consider increasing clonidine to 0.3mg  TID at next visit.   Written patient instructions provided.  Total time in face to face counseling 25 minutes.  Follow up in Pharmacist Clinic Visit 6 days.   Patient seen with Phillis Knack, PharmD Candidate, and Stephens November, PharmD Resident.

## 2015-03-30 NOTE — Assessment & Plan Note (Signed)
Hypertension longstanding currently worsened control likely related to dietary intake in salt (ate pork chops for dinner yesterday and breakfast today AND stopping Spironolactone at last visit.  Given SCr of > 2.5 the spironolactone was discontinued and we will likely not be able to use. Patient reports adherence with medication.    Consider increasing clonidine to 0.3mg  TID at next visit.

## 2015-03-30 NOTE — Patient Instructions (Signed)
Stopped spironolactone today.   Continue to work on your diet and limit salt.   Increase your Novolog to 25 units twice daily   Continue to use you pill box to take your medicine on schedule.   Follow up in 6 days (Next Thursday) with pharmacy clinic.

## 2015-03-30 NOTE — Assessment & Plan Note (Signed)
Diabetes longstanding currently slight improved control with recent insulin adjustments.  Patient denies hypoglycemic events and is able to verbalize appropriate hypoglycemia management plan. Patient reports adherence with medication. Control is suboptimal due to sedentary lifestyle and dietary indiscretion.  Continued basal insulin Lantus (insulin glargine) 60units BID andIncreased dose of rapid insulin Novolog (insulin aspart) to 25units BID (only eats two meals daily).  Consider adding Tanzeum at next visit.

## 2015-04-02 ENCOUNTER — Other Ambulatory Visit: Payer: Self-pay | Admitting: *Deleted

## 2015-04-02 MED ORDER — INSULIN PEN NEEDLE 31G X 8 MM MISC: Status: DC

## 2015-04-02 NOTE — Progress Notes (Signed)
Patient ID: Leonard [name], male   DOB: Nov 21, 1950, 65 y.o.   MRN: [id number] Reviewed: Agree with Dr. Graylin Shiver documentation and management.

## 2015-04-05 ENCOUNTER — Encounter: Payer: Self-pay | Admitting: Pharmacist

## 2015-04-05 ENCOUNTER — Ambulatory Visit (INDEPENDENT_AMBULATORY_CARE_PROVIDER_SITE_OTHER): Payer: Medicaid Other | Admitting: Pharmacist

## 2015-04-05 VITALS — BP 182/73 | HR 70 | Wt 238.0 lb

## 2015-04-05 DIAGNOSIS — E1121 Type 2 diabetes mellitus with diabetic nephropathy: Secondary | ICD-10-CM | POA: Diagnosis not present

## 2015-04-05 DIAGNOSIS — I5032 Chronic diastolic (congestive) heart failure: Secondary | ICD-10-CM | POA: Diagnosis not present

## 2015-04-05 DIAGNOSIS — I10 Essential (primary) hypertension: Secondary | ICD-10-CM | POA: Diagnosis not present

## 2015-04-05 MED ORDER — FUROSEMIDE 40 MG PO TABS: 60.0000 mg | ORAL_TABLET | Freq: Two times a day (BID) | ORAL | Status: DC

## 2015-04-05 MED ORDER — CARVEDILOL 25 MG PO TABS: 50.0000 mg | ORAL_TABLET | Freq: Two times a day (BID) | ORAL | Status: DC

## 2015-04-05 MED ORDER — CLONIDINE HCL 0.3 MG PO TABS: 0.3000 mg | ORAL_TABLET | Freq: Three times a day (TID) | ORAL | Status: DC

## 2015-04-05 NOTE — Progress Notes (Signed)
Patient ID: Leonard [name], male   DOB: 1950/07/10, 65 y.o.   MRN: [id number] Reviewed: Agree with Dr. Graylin Shiver documentation and management.

## 2015-04-05 NOTE — Patient Instructions (Signed)
Thank you for coming in today!  We increased your clonidine to 0.3 mg three times a day.    Continue to take your medications using the pill box you filled in clinic today.   Follow up with Dr. Valentina Lucks in 1 week.

## 2015-04-05 NOTE — Assessment & Plan Note (Signed)
Diabetes longstanding currently improved with improve adherence to insulin therapy.  Patient denies significant  hypoglycemic events and is able to verbalize appropriate hypoglycemia management plan. Patient reports adherence with medication with use of med box. Control is suboptimal due to sedentary lifestyle and some dietary discretion. Continued basal insulin Lantus (insulin glargine) and Continued rapid insulin Novolog (insulin aspart).

## 2015-04-05 NOTE — Progress Notes (Signed)
S:    Patient arrives unaccompanied, walking without assistance, in good spirits.  Presents for diabetes evaluation, education, and management at the request of Dr. Raeford Razor. Patient was referred on 03/13/2015.  Patient was last seen by Primary Care Provider on 12/15/2015.   Patient reports Diabetes was diagnosed ~ 13 years ago.   Patient reports adherence with medications.  Current diabetes medications include:  Lantus and Novolog Current hypertension medications include: amlodipine, carvedilol, Clonidine, furosemide and HCTZ  Patient reports hypoglycemic event however upon review patient managed a CBG of 84 which he was afraid would cause him to feel low later.   Patient reports self foot exams.  Patient has noted some amount of lower extremity swelling which he reports is unchanged from the past.    O:  Lab Results  Component Value Date   HGBA1C 12.0* 02/05/2015   Filed Vitals:   04/05/15 1010  BP: 182/73  Pulse: 70   PE:   Lower extremity edema 2++ pitting bilaterally with left greater than right.  Lung exam by Dr. Lindell Noe - Clear to Auscultation.   Home fasting CBG: low 100 readings the last two days.   With improved readings over the last two weeks.    A/P: Diabetes longstanding currently improved with improve adherence to insulin therapy.  Patient denies significant  hypoglycemic events and is able to verbalize appropriate hypoglycemia management plan. Patient reports adherence with medication with use of med box. Control is suboptimal due to sedentary lifestyle and some dietary discretion. Continued basal insulin Lantus (insulin glargine) and Continued rapid insulin Novolog (insulin aspart).   Hypertension longstanding currently uncontrolled.  Patient reports adherence with medication with use of medication box. Edema bilaterally is concerning however patient states no major change in lower extremity edema.  Control is suboptimal and will increase dose of clonidine to 0.3mg  TID.    Consider increasing HCTZ to 25mg  daily at next visit.   Written patient instructions provided.  Total time in face to face counseling 45 minutes.   Follow up with PCP in 1 week THEN with Pharmacist Clinic Visit in 1 more week to assist with pill box.   Patient seen with Rudolpho Sevin, PharmD Candidate, Nuala Alpha, PharmD Resident, and Elisabeth Most, PharmD Resident.

## 2015-04-05 NOTE — Assessment & Plan Note (Signed)
Hypertension longstanding currently uncontrolled.  Patient reports adherence with medication with use of medication box. Edema bilaterally is concerning however patient states no major change in lower extremity edema.  Control is suboptimal and will increase dose of clonidine to 0.3mg  TID.   Consider increasing HCTZ to 25mg  daily at next visit.

## 2015-04-12 ENCOUNTER — Ambulatory Visit (HOSPITAL_BASED_OUTPATIENT_CLINIC_OR_DEPARTMENT_OTHER): Payer: Medicaid Other

## 2015-04-12 ENCOUNTER — Other Ambulatory Visit (HOSPITAL_BASED_OUTPATIENT_CLINIC_OR_DEPARTMENT_OTHER): Payer: Medicaid Other

## 2015-04-12 VITALS — BP 147/69 | HR 66 | Temp 98.5°F | Resp 18

## 2015-04-12 DIAGNOSIS — D631 Anemia in chronic kidney disease: Secondary | ICD-10-CM

## 2015-04-12 DIAGNOSIS — N189 Chronic kidney disease, unspecified: Secondary | ICD-10-CM

## 2015-04-12 LAB — CBC & DIFF AND RETIC
BASO%: 0.3 % (ref 0.0–2.0)
BASOS ABS: 0 10*3/uL (ref 0.0–0.1)
EOS ABS: 0.3 10*3/uL (ref 0.0–0.5)
EOS%: 3.8 % (ref 0.0–7.0)
HEMATOCRIT: 29.8 % — AB (ref 38.4–49.9)
HEMOGLOBIN: 9.2 g/dL — AB (ref 13.0–17.1)
Immature Retic Fract: 5.9 % (ref 3.00–10.60)
LYMPH%: 26.5 % (ref 14.0–49.0)
MCH: 22.6 pg — AB (ref 27.2–33.4)
MCHC: 30.9 g/dL — ABNORMAL LOW (ref 32.0–36.0)
MCV: 73.2 fL — ABNORMAL LOW (ref 79.3–98.0)
MONO#: 0.8 10*3/uL (ref 0.1–0.9)
MONO%: 11.4 % (ref 0.0–14.0)
NEUT#: 3.9 10*3/uL (ref 1.5–6.5)
NEUT%: 58 % (ref 39.0–75.0)
Platelets: 195 10*3/uL (ref 140–400)
RBC: 4.07 10*6/uL — ABNORMAL LOW (ref 4.20–5.82)
RDW: 17.3 % — AB (ref 11.0–14.6)
RETIC %: 1.51 % (ref 0.80–1.80)
Retic Ct Abs: 61.46 10*3/uL (ref 34.80–93.90)
WBC: 6.8 10*3/uL (ref 4.0–10.3)
lymph#: 1.8 10*3/uL (ref 0.9–3.3)

## 2015-04-12 MED ORDER — DARBEPOETIN ALFA 200 MCG/0.4ML IJ SOSY
200.0000 ug | PREFILLED_SYRINGE | Freq: Once | INTRAMUSCULAR | Status: AC
  Administered 2015-04-12: 200 ug via SUBCUTANEOUS
  Filled 2015-04-12: qty 0.4

## 2015-04-12 NOTE — Patient Instructions (Signed)
Darbepoetin Alfa injection What is this medicine? DARBEPOETIN ALFA (dar be POE e tin AL fa) helps your body make more red blood cells. It is used to treat anemia caused by chronic kidney failure and chemotherapy. This medicine may be used for other purposes; ask your health care provider or pharmacist if you have questions. What should I tell my health care provider before I take this medicine? They need to know if you have any of these conditions: -blood clotting disorders or history of blood clots -cancer patient not on chemotherapy -cystic fibrosis -heart disease, such as angina, heart failure, or a history of a heart attack -hemoglobin level of 12 g/dL or greater -high blood pressure -low levels of folate, iron, or vitamin B12 -seizures -an unusual or allergic reaction to darbepoetin, erythropoietin, albumin, hamster proteins, latex, other medicines, foods, dyes, or preservatives -pregnant or trying to get pregnant -breast-feeding How should I use this medicine? This medicine is for injection into a vein or under the skin. It is usually given by a health care professional in a hospital or clinic setting. If you get this medicine at home, you will be taught how to prepare and give this medicine. Do not shake the solution before you withdraw a dose. Use exactly as directed. Take your medicine at regular intervals. Do not take your medicine more often than directed. It is important that you put your used needles and syringes in a special sharps container. Do not put them in a trash can. If you do not have a sharps container, call your pharmacist or healthcare provider to get one. Talk to your pediatrician regarding the use of this medicine in children. While this medicine may be used in children as young as 1 year for selected conditions, precautions do apply. Overdosage: If you think you have taken too much of this medicine contact a poison control center or emergency room at once. NOTE:  This medicine is only for you. Do not share this medicine with others. What if I miss a dose? If you miss a dose, take it as soon as you can. If it is almost time for your next dose, take only that dose. Do not take double or extra doses. What may interact with this medicine? Do not take this medicine with any of the following medications: -epoetin alfa This list may not describe all possible interactions. Give your health care provider a list of all the medicines, herbs, non-prescription drugs, or dietary supplements you use. Also tell them if you smoke, drink alcohol, or use illegal drugs. Some items may interact with your medicine. What should I watch for while using this medicine? Visit your prescriber or health care professional for regular checks on your progress and for the needed blood tests and blood pressure measurements. It is especially important for the doctor to make sure your hemoglobin level is in the desired range, to limit the risk of potential side effects and to give you the best benefit. Keep all appointments for any recommended tests. Check your blood pressure as directed. Ask your doctor what your blood pressure should be and when you should contact him or her. As your body makes more red blood cells, you may need to take iron, folic acid, or vitamin B supplements. Ask your doctor or health care provider which products are right for you. If you have kidney disease continue dietary restrictions, even though this medication can make you feel better. Talk with your doctor or health care professional about the   foods you eat and the vitamins that you take. What side effects may I notice from receiving this medicine? Side effects that you should report to your doctor or health care professional as soon as possible: -allergic reactions like skin rash, itching or hives, swelling of the face, lips, or tongue -breathing problems -changes in vision -chest pain -confusion, trouble speaking  or understanding -feeling faint or lightheaded, falls -high blood pressure -muscle aches or pains -pain, swelling, warmth in the leg -rapid weight gain -severe headaches -sudden numbness or weakness of the face, arm or leg -trouble walking, dizziness, loss of balance or coordination -seizures (convulsions) -swelling of the ankles, feet, hands -unusually weak or tired Side effects that usually do not require medical attention (report to your doctor or health care professional if they continue or are bothersome): -diarrhea -fever, chills (flu-like symptoms) -headaches -nausea, vomiting -redness, stinging, or swelling at site where injected This list may not describe all possible side effects. Call your doctor for medical advice about side effects. You may report side effects to FDA at 1-800-FDA-1088. Where should I keep my medicine? Keep out of the reach of children. Store in a refrigerator between 2 and 8 degrees C (36 and 46 degrees F). Do not freeze. Do not shake. Throw away any unused portion if using a single-dose vial. Throw away any unused medicine after the expiration date. NOTE: This sheet is a summary. It may not cover all possible information. If you have questions about this medicine, talk to your doctor, pharmacist, or health care provider.    2016, [employer]. (2008-01-04 10:23:57)  

## 2015-04-13 ENCOUNTER — Ambulatory Visit (INDEPENDENT_AMBULATORY_CARE_PROVIDER_SITE_OTHER): Payer: Medicaid Other | Admitting: Family Medicine

## 2015-04-13 ENCOUNTER — Encounter: Payer: Self-pay | Admitting: Family Medicine

## 2015-04-13 VITALS — BP 164/78 | HR 69 | Temp 98.4°F | Wt 238.0 lb

## 2015-04-13 DIAGNOSIS — E782 Mixed hyperlipidemia: Secondary | ICD-10-CM

## 2015-04-13 DIAGNOSIS — E785 Hyperlipidemia, unspecified: Secondary | ICD-10-CM

## 2015-04-13 DIAGNOSIS — E1121 Type 2 diabetes mellitus with diabetic nephropathy: Secondary | ICD-10-CM

## 2015-04-13 DIAGNOSIS — Z794 Long term (current) use of insulin: Secondary | ICD-10-CM

## 2015-04-13 DIAGNOSIS — I10 Essential (primary) hypertension: Secondary | ICD-10-CM

## 2015-04-13 LAB — LIPID PANEL
CHOL/HDL RATIO: 4.9 ratio (ref <=5.0)
Cholesterol: 107 mg/dL — ABNORMAL LOW (ref 125–200)
HDL: 22 mg/dL — ABNORMAL LOW (ref >=40)
LDL CALC: 38 mg/dL (ref <130)
Triglycerides: 234 mg/dL — ABNORMAL HIGH (ref <150)
VLDL: 47 mg/dL — AB (ref <30)

## 2015-04-13 NOTE — Patient Instructions (Signed)
Thank you for coming in,   We will increase your hydrochlorothiazide to 25 mg daily.   We will increase your lantus at night to 65 U. Please increase one unit per day until you reach 65 or until your morning blood sugar is less than 130.   Please follow up with Dr. Valentina Lucks the week after next.   Please bring all of your medications with you to each visit.   Sign up for My Chart to have easy access to your labs results, and communication with your Primary care physician   Please feel free to call with any questions or concerns at any time, at [phone]. --Dr. Raeford Razor  Diet Recommendations for Diabetes   Starchy (carb) foods include: Bread, rice, pasta, potatoes, corn, crackers, bagels, muffins, all baked goods.   Protein foods include: Meat, fish, poultry, eggs, dairy foods, and beans such as pinto and kidney beans (beans also provide carbohydrate).   1. Eat at least 3 meals and 1-2 snacks per day. Never go more than 4-5 hours while awake without eating.  2. Limit starchy foods to TWO per meal and ONE per snack. ONE portion of a starchy  food is equal to the following:   - ONE slice of bread (or its equivalent, such as half of a hamburger bun).   - 1/2 cup of a "scoopable" starchy food such as potatoes or rice.   - 1 OUNCE (28 grams) of starchy snack foods such as crackers or pretzels (look on label).   - 15 grams of carbohydrate as shown on food label.  3. Both lunch and dinner should include a protein food, a carb food, and vegetables.   - Obtain twice as many veg's as protein or carbohydrate foods for both lunch and dinner.   - Try to keep frozen veg's on hand for a quick vegetable serving.     - Fresh or frozen veg's are best.  4. Breakfast should always include protein.

## 2015-04-13 NOTE — Progress Notes (Signed)
   Subjective:    Leonard [name] - 65 y.o. male MRN [id number]  Date of birth: [birth year]  CC Dm2, HTN  HPI  Leonard [name] is here for Dm2, HTN.  CHRONIC DIABETES Constant and severe in nature.  No Chest pain or dyspnea  Has CHF, HTN and CAD  Disease Monitoring  Blood Sugar Ranges: reports his sugars have been elevated   Polyuria: no   Visual problems: no   Medication Compliance: yes  Medication Side Effects  Hypoglycemia: no   Preventitive Health Care  Eye Exam: up to date   Foot Exam: last visit with podiatry 03/27/2015. Has followed up scheduled in April   Diet pattern: reports eating some rice and potatoes   Exercise: reports to walking down and back on his block   HTN Disease Monitoring: Home BP Monitoring blood pressure checked yesterday 147/69   Medications:coreg, clonidine, lasix, HCTZ,  Chest pain- no     Dyspnea- no Compliance-  Yes. He has a Sport and exercise psychologist .  Lightheadedness-  no   Edema- having some swelling.   HYPERLIPIDEMIA Symptoms Chest pain on exertion:  None    . Leg claudication:   no Medications: lipitor  Compliance- yes   Right upper quadrant pain- no   Muscle aches- no     Component Value Date/Time   CHOL 107* 04/13/2015 1201   TRIG 234* 04/13/2015 1201   HDL 22* 04/13/2015 1201   VLDL 47* 04/13/2015 1201   CHOLHDL 4.9 04/13/2015 1201     SH: quit smoking in 2015 PMH: HTN, CAD, COPD, diastolic HF, MGUS, HLD   Health Maintenance:  - eye exam scheduled for next week.  Health Maintenance Due  Topic Date Due  . COLONOSCOPY  06/21/2001  . ZOSTAVAX  06/22/2011  . OPHTHALMOLOGY EXAM  02/04/2015    Review of Systems See HPI     Objective:   Physical Exam BP 164/78 mmHg  Pulse 69  Temp(Src) 98.4 F (36.9 C) (Oral)  Wt 238 lb (107.956 kg) Gen: NAD, alert, cooperative with exam,  HEENT: NCAT, EOMI, clear conjunctiva,  CV: RRR, good S1/S2, no murmur, +1 edema bilaterally Resp: CTABL, no wheezes, non-labored Skin: no rashes,  normal turgor  Neuro: no gross deficits.  Psych: alert and oriented    Assessment & Plan:   Essential hypertension, benign Stressed diet adherence  Filled his pill box today  - he fills his pill box on Sundays so advised that he increase his HCTZ to 25 mg  - he will follow up with Dr. Valentina Lucks in two weeks - performed teach back   DM (diabetes mellitus), type 2 with renal complications ([place]) Blood sugars are elevated  Again stressing the importance of diet  - will increase PM insulin to 65 U  - will increase one unit per day until morning blood sugars are around 120 or he reaches 65 U  Mixed hyperlipidemia Continue current medications  - check Lipid panel today

## 2015-04-14 DIAGNOSIS — E785 Hyperlipidemia, unspecified: Secondary | ICD-10-CM | POA: Insufficient documentation

## 2015-04-14 NOTE — Assessment & Plan Note (Signed)
Stressed diet adherence  Filled his pill box today  - he fills his pill box on Sundays so advised that he increase his HCTZ to 25 mg  - he will follow up with Dr. Valentina Lucks in two weeks - performed teach back

## 2015-04-14 NOTE — Assessment & Plan Note (Signed)
Blood sugars are elevated  Again stressing the importance of diet  - will increase PM insulin to 65 U  - will increase one unit per day until morning blood sugars are around 120 or he reaches 65 U

## 2015-04-14 NOTE — Assessment & Plan Note (Signed)
Continue current medications  - check Lipid panel today

## 2015-04-16 ENCOUNTER — Encounter: Payer: Self-pay | Admitting: Family Medicine

## 2015-04-26 ENCOUNTER — Ambulatory Visit (HOSPITAL_BASED_OUTPATIENT_CLINIC_OR_DEPARTMENT_OTHER): Payer: Medicaid Other

## 2015-04-26 ENCOUNTER — Other Ambulatory Visit: Payer: Self-pay | Admitting: Hematology and Oncology

## 2015-04-26 ENCOUNTER — Other Ambulatory Visit (HOSPITAL_BASED_OUTPATIENT_CLINIC_OR_DEPARTMENT_OTHER): Payer: Medicaid Other

## 2015-04-26 VITALS — BP 141/62 | HR 69 | Temp 98.5°F

## 2015-04-26 DIAGNOSIS — D631 Anemia in chronic kidney disease: Secondary | ICD-10-CM | POA: Diagnosis not present

## 2015-04-26 DIAGNOSIS — N189 Chronic kidney disease, unspecified: Secondary | ICD-10-CM

## 2015-04-26 LAB — CBC & DIFF AND RETIC
BASO%: 0.3 % (ref 0.0–2.0)
Basophils Absolute: 0 10e3/uL (ref 0.0–0.1)
EOS%: 4.7 % (ref 0.0–7.0)
Eosinophils Absolute: 0.4 10e3/uL (ref 0.0–0.5)
HCT: 27.8 % — ABNORMAL LOW (ref 38.4–49.9)
HGB: 8.5 g/dL — ABNORMAL LOW (ref 13.0–17.1)
Immature Retic Fract: 10.9 % — ABNORMAL HIGH (ref 3.00–10.60)
LYMPH%: 24 % (ref 14.0–49.0)
MCH: 22.4 pg — ABNORMAL LOW (ref 27.2–33.4)
MCHC: 30.6 g/dL — ABNORMAL LOW (ref 32.0–36.0)
MCV: 73.2 fL — ABNORMAL LOW (ref 79.3–98.0)
MONO#: 0.4 10e3/uL (ref 0.1–0.9)
MONO%: 6 % (ref 0.0–14.0)
NEUT#: 4.8 10e3/uL (ref 1.5–6.5)
NEUT%: 65 % (ref 39.0–75.0)
Platelets: 192 10e3/uL (ref 140–400)
RBC: 3.8 10e6/uL — ABNORMAL LOW (ref 4.20–5.82)
RDW: 17.9 % — ABNORMAL HIGH (ref 11.0–14.6)
Retic %: 1.87 % — ABNORMAL HIGH (ref 0.80–1.80)
Retic Ct Abs: 71.06 10e3/uL (ref 34.80–93.90)
WBC: 7.4 10e3/uL (ref 4.0–10.3)
lymph#: 1.8 10e3/uL (ref 0.9–3.3)

## 2015-04-26 MED ORDER — DARBEPOETIN ALFA 300 MCG/0.6ML IJ SOSY
300.0000 ug | PREFILLED_SYRINGE | Freq: Once | INTRAMUSCULAR | Status: AC
  Administered 2015-04-26: 300 ug via SUBCUTANEOUS
  Filled 2015-04-26: qty 0.6

## 2015-04-27 ENCOUNTER — Ambulatory Visit (INDEPENDENT_AMBULATORY_CARE_PROVIDER_SITE_OTHER): Payer: Medicaid Other | Admitting: Pharmacist

## 2015-04-27 ENCOUNTER — Encounter: Payer: Self-pay | Admitting: Pharmacist

## 2015-04-27 VITALS — BP 188/78 | HR 68 | Wt 237.5 lb

## 2015-04-27 DIAGNOSIS — I10 Essential (primary) hypertension: Secondary | ICD-10-CM

## 2015-04-27 NOTE — Patient Instructions (Addendum)
Thank you for coming to pharmacy clinic today!  Continue to take your medications out of your pill box. Start taking your Novolog 25 units three times a day with each meal (breakfast, lunch, and dinner) Increase Lantus to 65 units twice a day We have increased your Lasix for the next 3 days to help with your blood pressure  Work on decrease the amount of salt in your diet (less canned food, more fresh vegetables) Try to keep your amount of sodium per day less than 2000mg .  Follow up with Pharmacy Clinic this Thursday March 30th at 10:30.

## 2015-04-27 NOTE — Progress Notes (Signed)
Patient ID: Leonard [name], male   DOB: June 02, 1950, 65 y.o.   MRN: [id number] Reviewed: Agree with Dr. Graylin Shiver documentation and management.

## 2015-04-27 NOTE — Assessment & Plan Note (Signed)
Hypertension longstanding diagnosed currently uncontrolled. Control is suboptimal due to diet, CKD, and suboptimal medication. Patient's BP reading at a [facility] appointment yesterday was significantly lower than the reading in office today. Likely d/t high salt diet consumed yesterday and this morning. Patient reports adherence with medication. Plan to increase lasix to 80 mg bid for the next 3 days. Reviewed with Dr. McDiarmid. Reviewed dietary changes the patient can make to decrease his salt consumption.

## 2015-04-27 NOTE — Progress Notes (Signed)
S:    Patient arrives in good spirits.  Presents for diabetes evaluation, education, and management at the request of Dr. Raeford Razor. Patient was last seen by Primary Care Provider on 04/13/15.   Patient reports Diabetes was diagnosed 13-15 years ago.   Patient reports adherence with medications.  Current diabetes medications include: Novolog, Lantus Current hypertension medications include: furosemide, carvedilol, amlodipine, clonidine  Patient denies hypoglycemic events.  Patient reported dietary habits: Eats 3 meals/day Breakfast: egg and cheese Lunch: [employer] chili Dinner: largest meal of the day Drinks: water, sparkling water, reports that he longer drinks soda Patient reported a high salt diet yesterday for dinner (2 pot pies, hamburger, beans) and this morning (beans and presliced [place])  Patient reports he has an appointment with someone to discussion diet in the next few weeks  O:  Lab Results  Component Value Date   HGBA1C 12.0* 02/05/2015   There were no vitals filed for this visit.  Home CBG per patient's meter report: 200-250  A/P: Diabetes longstanding currently uncontrolled . Patient denies hypoglycemic events and is able to verbalize appropriate hypoglycemia management plan. Patient reports adherence with medication. Control is suboptimal due to suboptimal medication regimen. Increased dose of basal insulin Lantus (insulin glargine) to 65 units bid. Increased frequency of rapid insulin Novolog (insulin aspart) from 25 units bid to 25 units tid. Next A1C anticipated April 2017.    Hypertension longstanding diagnosed currently uncontrolled. Control is suboptimal due to diet, CKD, and suboptimal medication. Patient's BP reading at a [facility] appointment yesterday was significantly lower than the reading in office today. Likely d/t high salt diet consumed yesterday and this morning. Patient reports adherence with medication. Plan to increase lasix to 80 mg bid for the next 3  days. Reviewed with Dr. McDiarmid. Reviewed dietary changes the patient can make to decrease his salt consumption.   Written patient instructions provided.  Total time in face to face counseling 45 minutes. Follow up in Pharmacist Clinic Visit next Thursday March 30th.   Patient seen with Rudolpho Sevin, PharmD Candidate, and Nuala Alpha, PharmD Resident.

## 2015-05-01 ENCOUNTER — Telehealth: Payer: Self-pay | Admitting: Hematology and Oncology

## 2015-05-01 NOTE — Telephone Encounter (Signed)
s.w. pt and r/s appt time....pt ok and aware of new time

## 2015-05-02 ENCOUNTER — Telehealth: Payer: Self-pay | Admitting: Hematology and Oncology

## 2015-05-02 NOTE — Telephone Encounter (Signed)
returned call and s.w pt and confirmed appts °

## 2015-05-03 ENCOUNTER — Ambulatory Visit (INDEPENDENT_AMBULATORY_CARE_PROVIDER_SITE_OTHER): Payer: Medicaid Other | Admitting: Pharmacist

## 2015-05-03 ENCOUNTER — Encounter: Payer: Self-pay | Admitting: Pharmacist

## 2015-05-03 VITALS — BP 169/61 | HR 67 | Wt 239.2 lb

## 2015-05-03 DIAGNOSIS — E1121 Type 2 diabetes mellitus with diabetic nephropathy: Secondary | ICD-10-CM | POA: Diagnosis not present

## 2015-05-03 DIAGNOSIS — I10 Essential (primary) hypertension: Secondary | ICD-10-CM | POA: Diagnosis not present

## 2015-05-03 LAB — BASIC METABOLIC PANEL
BUN: 50 mg/dL — AB (ref 7–25)
CALCIUM: 8.5 mg/dL — AB (ref 8.6–10.3)
CO2: 22 mmol/L (ref 20–31)
Chloride: 101 mmol/L (ref 98–110)
Creat: 2.93 mg/dL — ABNORMAL HIGH (ref 0.70–1.25)
GLUCOSE: 261 mg/dL — AB (ref 65–99)
Potassium: 3.7 mmol/L (ref 3.5–5.3)
Sodium: 136 mmol/L (ref 135–146)

## 2015-05-03 MED ORDER — INSULIN GLARGINE 100 UNIT/ML SOLOSTAR PEN: 65.0000 [IU] | PEN_INJECTOR | Freq: Two times a day (BID) | SUBCUTANEOUS | Status: DC

## 2015-05-03 MED ORDER — HYDROCHLOROTHIAZIDE 25 MG PO TABS: 25.0000 mg | ORAL_TABLET | Freq: Every day | ORAL | Status: DC

## 2015-05-03 NOTE — Progress Notes (Signed)
Patient ID: Leonard [name], male   DOB: Feb 26, 1950, 65 y.o.   MRN: [id number] Reviewed: Agree with Dr. Graylin Shiver documentation and management.

## 2015-05-03 NOTE — Assessment & Plan Note (Signed)
Hypertension longstanding diagnosed currently uncontrolled. Control is suboptimal due to diet, CKD, and suboptimal medication. Patient's BP reading at home are significantly lower than the reading in office today. Patient reports adherence with medication.  Continue to monitor edema. BMET was ordered today.

## 2015-05-03 NOTE — Assessment & Plan Note (Signed)
Diabetes longstanding. Patient denies hypoglycemic events and is able to verbalize appropriate hypoglycemia management plan. Patient reports adherence with medication. Control remains suboptimal due to suboptimal medication, however improved from previous visit. Plan is to increase the Lantus (insulin glargine) to 65 units BID. Increase Novolog (insulin aspart) to 30 units TID. Pt verbally confirmed the new changes to his insulin regimen. Next A1C anticipated April 2017.

## 2015-05-03 NOTE — Patient Instructions (Signed)
Increase your insulin   Lantus 65 units twice daily.   Novolog 30 units with each meal.   Next pharmacy clinic visit on 4/13.

## 2015-05-03 NOTE — Progress Notes (Signed)
S:    Patient arrives in good spirits and ambulating on his own. Patient was last seen by Primary Care Provider on 04/13/2015. His blood glucose levels are still elevated. Patient reports Diabetes was diagnosed 13-15 years ago.  Pt has cataract surgery on April 25th. Advised to d/c his aspirin 2 weeks prior to the surgery.   His HTN is elevated when he is here in clinic but reports his BP is lower when he checks at home.   Patient denies hypoglycemic events.  Patient reported dietary habits: Eats 3 meals/day Breakfast: egg and cheese Lunch: [employer] chili Dinner: largest meal of the day Drinks: water, sparkling water, reports that he longer drinks soda Patient reported a high salt diet yesterday for dinner (2 pot pies, hamburger, beans) and this morning (beans and presliced [place]) Patient reported exercise habits:    Patient denies nocturia.  Patient denies neuropathy. Patient denies visual changes. Patient reports self foot exams.   O:  Patient reports adherence with medications.  Current diabetes medications include: Novolog, Lantus Current hypertension medications include: furosemide, carvedilol, amlodipine, clonidine, HCTZ Lab Results  Component Value Date   HGBA1C 12.0* 02/05/2015   7 day home glucose monitoring: 231   Filed Vitals:   05/03/15 1001 05/03/15 1012  BP: 181/67 169/61  Pulse: 68 67   PE; lower extremities; trace edema bilaterally  A/P: Diabetes longstanding. Patient denies hypoglycemic events and is able to verbalize appropriate hypoglycemia management plan. Patient reports adherence with medication. Control remains suboptimal due to suboptimal medication, however improved from previous visit. Plan is to increase the Lantus (insulin glargine) to 65 units BID. Increase Novolog (insulin aspart) to 30 units TID. Pt verbally confirmed the new changes to his insulin regimen. Next A1C anticipated April 2017.  Hypertension longstanding diagnosed currently  uncontrolled. Control is suboptimal due to diet, CKD, and suboptimal medication. Patient's BP reading at home are significantly lower than the reading in office today. Patient reports adherence with medication.  Continue to monitor edema. BMET was ordered today.   Written patient instructions provided. Total time in face to face counseling 35 minutes. Follow up in Pharmacist Clinic Visit April 13th. Patient seen with Rudolpho Sevin, PharmD Candidate, Milana Na, PharmD Candidate, and Elisabeth Most, PharmD Resident.

## 2015-05-07 ENCOUNTER — Encounter: Payer: Self-pay | Admitting: Family Medicine

## 2015-05-10 ENCOUNTER — Ambulatory Visit (HOSPITAL_BASED_OUTPATIENT_CLINIC_OR_DEPARTMENT_OTHER): Payer: Medicaid Other

## 2015-05-10 ENCOUNTER — Other Ambulatory Visit (HOSPITAL_BASED_OUTPATIENT_CLINIC_OR_DEPARTMENT_OTHER): Payer: Medicaid Other

## 2015-05-10 VITALS — Temp 98.4°F

## 2015-05-10 DIAGNOSIS — D631 Anemia in chronic kidney disease: Secondary | ICD-10-CM

## 2015-05-10 DIAGNOSIS — N189 Chronic kidney disease, unspecified: Secondary | ICD-10-CM | POA: Diagnosis present

## 2015-05-10 LAB — CBC & DIFF AND RETIC
BASO%: 0.3 % (ref 0.0–2.0)
BASOS ABS: 0 10*3/uL (ref 0.0–0.1)
EOS ABS: 0.4 10*3/uL (ref 0.0–0.5)
EOS%: 6.1 % (ref 0.0–7.0)
HEMATOCRIT: 27.5 % — AB (ref 38.4–49.9)
HEMOGLOBIN: 8.5 g/dL — AB (ref 13.0–17.1)
IMMATURE RETIC FRACT: 11.3 % — AB (ref 3.00–10.60)
LYMPH%: 24.7 % (ref 14.0–49.0)
MCH: 22.5 pg — AB (ref 27.2–33.4)
MCHC: 30.9 g/dL — ABNORMAL LOW (ref 32.0–36.0)
MCV: 72.9 fL — AB (ref 79.3–98.0)
MONO#: 0.7 10*3/uL (ref 0.1–0.9)
MONO%: 9.6 % (ref 0.0–14.0)
NEUT%: 59.3 % (ref 39.0–75.0)
NEUTROS ABS: 4 10*3/uL (ref 1.5–6.5)
Platelets: 179 10*3/uL (ref 140–400)
RBC: 3.77 10*6/uL — ABNORMAL LOW (ref 4.20–5.82)
RDW: 18.4 % — ABNORMAL HIGH (ref 11.0–14.6)
RETIC %: 1.83 % — AB (ref 0.80–1.80)
Retic Ct Abs: 68.99 10*3/uL (ref 34.80–93.90)
WBC: 6.8 10*3/uL (ref 4.0–10.3)
lymph#: 1.7 10*3/uL (ref 0.9–3.3)

## 2015-05-10 MED ORDER — DARBEPOETIN ALFA 300 MCG/0.6ML IJ SOSY
300.0000 ug | PREFILLED_SYRINGE | Freq: Once | INTRAMUSCULAR | Status: AC
  Administered 2015-05-10: 300 ug via SUBCUTANEOUS
  Filled 2015-05-10: qty 0.6

## 2015-05-17 ENCOUNTER — Ambulatory Visit (INDEPENDENT_AMBULATORY_CARE_PROVIDER_SITE_OTHER): Payer: Medicaid Other | Admitting: Pharmacist

## 2015-05-17 ENCOUNTER — Encounter: Payer: Self-pay | Admitting: Pharmacist

## 2015-05-17 VITALS — BP 156/70 | HR 68 | Ht 68.0 in | Wt 240.0 lb

## 2015-05-17 DIAGNOSIS — E1121 Type 2 diabetes mellitus with diabetic nephropathy: Secondary | ICD-10-CM

## 2015-05-17 DIAGNOSIS — I10 Essential (primary) hypertension: Secondary | ICD-10-CM

## 2015-05-17 MED ORDER — INSULIN ASPART 100 UNIT/ML FLEXPEN: 35.0000 [IU] | PEN_INJECTOR | Freq: Three times a day (TID) | SUBCUTANEOUS | Status: DC

## 2015-05-17 NOTE — Assessment & Plan Note (Signed)
Hypertension longstanding remains uncontrolled on multidrug regimen.  Patient reports adherence with medication. Control is suboptimal due to diet, CKD.  Increased dose of furosemide to 80 mg BID for 3 days due to edema.  Counseled patient to resume furosemide 60 mg BID on 05/21/15.  Reviewed plan with Dr. Erin Hearing.

## 2015-05-17 NOTE — Assessment & Plan Note (Addendum)
Diabetes longstanding. Patient denies hypoglycemic events and is able to verbalize appropriate hypoglycemia management plan. Patient reports adherence with medication. Control remains suboptimal due to suboptimal medication, however improved from previous visit.  Continued basal insulin Lantus (insulin glargine) 65 units BID.  Increased dose of rapid insulin Novolog (insulin aspart) to 35 units TID.  Next A1C anticipated at next visit.    Hypertension longstanding remains uncontrolled.  Patient reports adherence with medication. Control is suboptimal due to diet, CKD.  Increased dose of furosemide to 80 mg BID for 3 days due to edema.  Counseled patient to resume furosemide 60 mg BID on 05/21/15.  Reviewed plan with Dr. Erin Hearing.

## 2015-05-17 NOTE — Progress Notes (Signed)
S:    Patient arrives in good spirits, ambulating without assistance.  Patient was last seen by Primary Care Provider on 04/13/15.   Patient reports Diabetes was diagnosed 13-15 years ago.   Patient has cataract surgery on 05/29/15 and was advised to discontinue his aspirin 2 weeks prior to the surgery.    His blood pressure is elevated when he is here in clinic but reports his blood pressure is lower when he checks at home.    Patient reports adherence with medications.  Current diabetes medications include: Lantus and Novolog Current hypertension medications include: amlodipine, carvedilol, clonidine, hydrochlorothiazide, furosemide  Patient denies hypoglycemic events.   O:  Lab Results  Component Value Date   HGBA1C 12.0* 02/05/2015   Filed Vitals:   05/17/15 1011  BP: 156/70  Pulse: 68  PE: lower extremities; 2-3 + pitting edema  Home fasting CBG: range of 139-301 with mostly low 200s  A/P: Diabetes longstanding. Patient denies hypoglycemic events and is able to verbalize appropriate hypoglycemia management plan. Patient reports adherence with medication. Control remains suboptimal due to suboptimal medication, however improved from previous visit.  Continued basal insulin Lantus (insulin glargine) 65 units BID.  Increased dose of rapid insulin Novolog (insulin aspart) to 35 units TID.  Next A1C anticipated at next visit.    Hypertension longstanding remains uncontrolled.  Patient reports adherence with medication. Control is suboptimal due to diet, CKD.  Increased dose of furosemide to 80 mg BID for 3 days due to edema.  Counseled patient to resume furosemide 60 mg BID on 05/21/15.  Reviewed plan with Dr. Erin Hearing.    Written patient instructions provided.  Total time in face to face counseling 30 minutes.  Follow up in Pharmacist Clinic Visit in two weeks.   Patient seen with Rudolpho Sevin, PharmD Candidate, Governor Spe[place], PharmD Resident, and Elisabeth Most, PharmD  Resident.

## 2015-05-17 NOTE — Patient Instructions (Addendum)
Increase your Novolog insulin to 35 units 3 times daily with meals.  Increase your furosemide (Lasix) dose to 80 mg (2 tablets) for 3 days. Restart furosemide 60 mg (1.5 tablets) twice per day on Monday 05/21/15.  Clarify with your eye doctor when you can start taking your aspirin after your procedure.  Follow up with [place] in 2 weeks.

## 2015-05-17 NOTE — Progress Notes (Signed)
Patient ID: Leonard [name], male   DOB: 08-12-1950, 65 y.o.   MRN: [id number] Reviewed: Agree with Dr. Graylin Shiver documentation and management.

## 2015-05-24 ENCOUNTER — Ambulatory Visit (HOSPITAL_BASED_OUTPATIENT_CLINIC_OR_DEPARTMENT_OTHER): Payer: Medicaid Other

## 2015-05-24 ENCOUNTER — Other Ambulatory Visit: Payer: Medicaid Other

## 2015-05-24 ENCOUNTER — Ambulatory Visit (HOSPITAL_BASED_OUTPATIENT_CLINIC_OR_DEPARTMENT_OTHER): Payer: Medicaid Other | Admitting: Hematology and Oncology

## 2015-05-24 ENCOUNTER — Ambulatory Visit: Payer: Medicaid Other | Admitting: Hematology and Oncology

## 2015-05-24 ENCOUNTER — Telehealth: Payer: Self-pay | Admitting: Hematology and Oncology

## 2015-05-24 ENCOUNTER — Ambulatory Visit: Payer: Medicaid Other

## 2015-05-24 ENCOUNTER — Other Ambulatory Visit (HOSPITAL_BASED_OUTPATIENT_CLINIC_OR_DEPARTMENT_OTHER): Payer: Medicaid Other

## 2015-05-24 VITALS — BP 148/70 | HR 65 | Temp 97.8°F | Resp 18 | Ht 68.0 in | Wt 240.4 lb

## 2015-05-24 DIAGNOSIS — N189 Chronic kidney disease, unspecified: Secondary | ICD-10-CM

## 2015-05-24 DIAGNOSIS — D472 Monoclonal gammopathy: Secondary | ICD-10-CM | POA: Diagnosis not present

## 2015-05-24 DIAGNOSIS — D631 Anemia in chronic kidney disease: Secondary | ICD-10-CM

## 2015-05-24 DIAGNOSIS — I10 Essential (primary) hypertension: Secondary | ICD-10-CM

## 2015-05-24 LAB — CBC & DIFF AND RETIC
BASO%: 0.3 % (ref 0.0–2.0)
BASOS ABS: 0 10*3/uL (ref 0.0–0.1)
EOS%: 5.3 % (ref 0.0–7.0)
Eosinophils Absolute: 0.4 10*3/uL (ref 0.0–0.5)
HEMATOCRIT: 29.5 % — AB (ref 38.4–49.9)
HGB: 9.1 g/dL — ABNORMAL LOW (ref 13.0–17.1)
Immature Retic Fract: 8.9 % (ref 3.00–10.60)
LYMPH#: 1.2 10*3/uL (ref 0.9–3.3)
LYMPH%: 17.4 % (ref 14.0–49.0)
MCH: 22.2 pg — AB (ref 27.2–33.4)
MCHC: 30.8 g/dL — AB (ref 32.0–36.0)
MCV: 72.1 fL — ABNORMAL LOW (ref 79.3–98.0)
MONO#: 0.8 10*3/uL (ref 0.1–0.9)
MONO%: 12 % (ref 0.0–14.0)
NEUT%: 65 % (ref 39.0–75.0)
NEUTROS ABS: 4.6 10*3/uL (ref 1.5–6.5)
Platelets: 182 10*3/uL (ref 140–400)
RBC: 4.09 10*6/uL — AB (ref 4.20–5.82)
RDW: 18.1 % — ABNORMAL HIGH (ref 11.0–14.6)
RETIC %: 1.57 % (ref 0.80–1.80)
RETIC CT ABS: 64.21 10*3/uL (ref 34.80–93.90)
WBC: 7 10*3/uL (ref 4.0–10.3)

## 2015-05-24 MED ORDER — DARBEPOETIN ALFA 300 MCG/0.6ML IJ SOSY
300.0000 ug | PREFILLED_SYRINGE | Freq: Once | INTRAMUSCULAR | Status: AC
  Administered 2015-05-24: 300 ug via SUBCUTANEOUS
  Filled 2015-05-24: qty 0.6

## 2015-05-24 NOTE — Assessment & Plan Note (Signed)
The patient has a lack of reticulocytosis with relatively poor response with erythropoietin production. 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 He has responded well to ESA. We will continue 300 micrograms every 2 weeks to keep hemoglobin greater than 11

## 2015-05-24 NOTE — Progress Notes (Signed)
[facility] OFFICE PROGRESS NOTE  Patient Care Team: Rosemarie Ax, MD as PCP - General [name], MD as Consulting Physician (Nephrology)  SUMMARY OF ONCOLOGIC HISTORY:  Leonard [name] 65 y.o. male is here because of progressive anemia, renal failure and MGUS. I reviewed reports from his nephrologist office. The patient have significant peripheral vascular disease, diabetes and hypertension. Early this year, he had baseline creatinine of around 1.3 and subsequently developed heart failure. He was placed on aggressive medication/regimen and subsequently was noted to have progressive renal failure and anemia. According to his blood work dated 01/03/2015, white blood cell count is at 6.6, hemoglobin 7.1, platelet count of 153, creatinine of 2.4, calcium of 8.9, MCV 76, iron saturation 15%, ferritin 180, serum vitamin B-12 of 360. Serum protein electrophoresis initially detected M spike but subsequently not quantifiable. Urine immunofixation showed Bence-Jones Proteinuria. Echocardiogram show preserved ejection fraction 55-60% but with evidence of diastolic heart failure. He denies history of abnormal bone pain or bone fracture. Patient denies history of recurrent infection or atypical infections such as shingles of meningitis. Denies chills, night sweats, anorexia or abnormal weight loss. He complained of fatigue and shortness of breath on minimal exertion. The patient denies any recent signs or symptoms of bleeding such as spontaneous hematuria or hematochezia. He has occasional epistaxis when he blows his nose. He does not have spontaneous epistaxis Evaluation in December 2016 show he had anemia chronic disease and MGUS  On 02/01/2015, he is started on Aranesp, 200 g every 2 weeks to keep hemoglobin greater than 11   INTERVAL HISTORY: Please see below for problem oriented charting. He feels well. Denies recent infection No new bone pain  REVIEW OF SYSTEMS:    Constitutional: Denies fevers, chills or abnormal weight loss Eyes: Denies blurriness of vision Ears, nose, mouth, throat, and face: Denies mucositis or sore throat Respiratory: Denies cough, dyspnea or wheezes Cardiovascular: Denies palpitation, chest discomfort or lower extremity swelling Gastrointestinal:  Denies nausea, heartburn or change in bowel habits Skin: Denies abnormal skin rashes Lymphatics: Denies new lymphadenopathy or easy bruising Neurological:Denies numbness, tingling or new weaknesses Behavioral/Psych: Mood is stable, no new changes  All other systems were reviewed with the patient and are negative.  I have reviewed the past medical history, past surgical history, social history and family history with the patient and they are unchanged from previous note.  ALLERGIES:  has No Known Allergies.  MEDICATIONS:  Current Outpatient Prescriptions  Medication Sig Dispense Refill  . amLODipine (NORVASC) 10 MG tablet Take 1 tablet (10 mg total) by mouth daily. 90 tablet 1  . aspirin 81 MG tablet Take 1 tablet (81 mg total) by mouth daily. (Patient not taking: Reported on 05/17/2015) 30 tablet 3  . atorvastatin (LIPITOR) 40 MG tablet TAKE 1 TABLET BY MOUTH EVERY DAY AT 6 PM 90 tablet 1  . carvedilol (COREG) 25 MG tablet Take 2 tablets (50 mg total) by mouth 2 (two) times daily with a meal. 180 tablet 3  . cloNIDine (CATAPRES) 0.3 MG tablet Take 1 tablet (0.3 mg total) by mouth 3 (three) times daily. 270 tablet 3  . furosemide (LASIX) 40 MG tablet Take 1.5 tablets (60 mg total) by mouth 2 (two) times daily. 1 and 1/2 tablet daily 135 tablet 3  . hydrochlorothiazide (HYDRODIURIL) 25 MG tablet Take 1 tablet (25 mg total) by mouth daily. 90 tablet 1  . insulin aspart (NOVOLOG FLEXPEN) 100 UNIT/ML FlexPen Inject 35 Units into the skin 3 (  three) times daily with meals. 15 mL 6  . Insulin Glargine (LANTUS SOLOSTAR) 100 UNIT/ML Solostar Pen Inject 65 Units into the skin 2 (two) times  daily. 15 mL 3  . Insulin Pen Needle (B-D ULTRAFINE III SHORT PEN) 31G X 8 MM MISC Use with insulin pen twice daily 100 each 2   No current facility-administered medications for this visit.    PHYSICAL EXAMINATION: ECOG PERFORMANCE STATUS: 1 - Symptomatic but completely ambulatory  Filed Vitals:   05/24/15 1053  BP: 148/70  Pulse: 65  Temp: 97.8 F (36.6 C)  Resp: 18   Filed Weights   05/24/15 1053  Weight: 240 lb 6.4 oz (109.045 kg)    GENERAL:alert, no distress and comfortable SKIN: skin color, texture, turgor are normal, no rashes or significant lesions EYES: normal, Conjunctiva are pink and non-injected, sclera clear OROPHARYNX:no exudate, no erythema and lips, buccal mucosa, and tongue normal  NECK: supple, thyroid normal size, non-tender, without nodularity LYMPH:  no palpable lymphadenopathy in the cervical, axillary or inguinal LUNGS: clear to auscultation and percussion with normal breathing effort HEART: regular rate & rhythm and no murmurs and no lower extremity edema ABDOMEN:abdomen soft, non-tender and normal bowel sounds Musculoskeletal:no cyanosis of digits and no clubbing  NEURO: alert & oriented x 3 with fluent speech, no focal motor/sensory deficits  LABORATORY DATA:  I have reviewed the data as listed    Component Value Date/Time   NA 136 05/03/2015 1023   NA 138 01/15/2015 1416   K 3.7 05/03/2015 1023   K 4.2 01/15/2015 1416   CL 101 05/03/2015 1023   CO2 22 05/03/2015 1023   CO2 25 01/15/2015 1416   GLUCOSE 261* 05/03/2015 1023   GLUCOSE 344* 01/15/2015 1416   BUN 50* 05/03/2015 1023   BUN 50.4* 01/15/2015 1416   CREATININE 2.93* 05/03/2015 1023   CREATININE 2.50* 02/05/2015 1345   CREATININE 2.7* 01/15/2015 1416   CALCIUM 8.5* 05/03/2015 1023   CALCIUM 9.7 01/15/2015 1416   PROT 7.3 01/15/2015 1416   PROT 6.5 02/10/2014 1045   ALBUMIN 4.3 01/15/2015 1416   ALBUMIN 3.6 02/10/2014 1045   AST 13 01/15/2015 1416   AST 26 02/10/2014 1045    ALT 13 01/15/2015 1416   ALT 30 02/10/2014 1045   ALKPHOS 83 01/15/2015 1416   ALKPHOS 73 02/10/2014 1045   BILITOT 0.35 01/15/2015 1416   BILITOT 0.5 02/10/2014 1045   GFRNONAA 26* 02/05/2015 1345   GFRNONAA 31* 11/28/2014 1440   GFRAA 30* 02/05/2015 1345   GFRAA 36* 11/28/2014 1440    No results found for: SPEP, UPEP  Lab Results  Component Value Date   WBC 7.0 05/24/2015   NEUTROABS 4.6 05/24/2015   HGB 9.1* 05/24/2015   HCT 29.5* 05/24/2015   MCV 72.1* 05/24/2015   PLT 182 05/24/2015      Chemistry      Component Value Date/Time   NA 136 05/03/2015 1023   NA 138 01/15/2015 1416   K 3.7 05/03/2015 1023   K 4.2 01/15/2015 1416   CL 101 05/03/2015 1023   CO2 22 05/03/2015 1023   CO2 25 01/15/2015 1416   BUN 50* 05/03/2015 1023   BUN 50.4* 01/15/2015 1416   CREATININE 2.93* 05/03/2015 1023   CREATININE 2.50* 02/05/2015 1345   CREATININE 2.7* 01/15/2015 1416      Component Value Date/Time   CALCIUM 8.5* 05/03/2015 1023   CALCIUM 9.7 01/15/2015 1416   ALKPHOS 83 01/15/2015 1416   ALKPHOS 73  02/10/2014 1045   AST 13 01/15/2015 1416   AST 26 02/10/2014 1045   ALT 13 01/15/2015 1416   ALT 30 02/10/2014 1045   BILITOT 0.35 01/15/2015 1416   BILITOT 0.5 02/10/2014 1045      ASSESSMENT & PLAN:  Anemia in chronic kidney disease  The patient has a lack of reticulocytosis with relatively poor response with erythropoietin production. 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 He has responded well to ESA. We will continue 300 micrograms every 2 weeks to keep hemoglobin greater than 11  Essential hypertension, benign he will continue current medical management. I recommend close follow-up with primary care doctor for medication adjustment.    MGUS (monoclonal gammopathy of unknown significance)  The patient was originally referred here because of detectable M spike in his blood which subsequently become undetectable. Spot urine  elsewhere tested positive for Bence-Jones proteinuria. Extensive workup last week did not take up any abnormal M spike. Although his serum light chains were high, this is likely related to renal disease and the ratio was just barely above the upper limit of normal. For this, I would recommend repeat follow-up in about 6 months, due in June 2017 In the future, I would consider adding 24-hour urine collection for this. In any case, I do not believe his renal failure and anemia is related to multiple myeloma.  I do not feel that he would require bone marrow biopsy for this.   No orders of the defined types were placed in this encounter.   All questions were answered. The patient knows to call the clinic with any problems, questions or concerns. No barriers to learning was detected. I spent 15 minutes counseling the patient face to face. The total time spent in the appointment was 20 minutes and more than 50% was on counseling and review of test results     [facility], [place], MD 05/24/2015 3:22 PM

## 2015-05-24 NOTE — Assessment & Plan Note (Signed)
he will continue current medical management. I recommend close follow-up with primary care doctor for medication adjustment.  

## 2015-05-24 NOTE — Assessment & Plan Note (Signed)
The patient was originally referred here because of detectable M spike in his blood which subsequently become undetectable. 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 Extensive workup last week did not take up any abnormal M spike. Although his serum light chains were high, this is likely related to renal disease and the ratio was just barely above the upper limit of normal. For this, I would recommend repeat follow-up in about 6 months, due in June 2017 In the future, I would consider adding 24-hour urine collection for this. In any case, I do not believe his renal failure and anemia is related to multiple myeloma.  I do not feel that he would require bone marrow biopsy for this. 

## 2015-05-24 NOTE — Telephone Encounter (Signed)
per pof to sch pt appt-gave pt copy of avs °

## 2015-05-28 ENCOUNTER — Other Ambulatory Visit: Payer: Self-pay | Admitting: Hematology and Oncology

## 2015-05-28 DIAGNOSIS — D472 Monoclonal gammopathy: Secondary | ICD-10-CM

## 2015-06-03 ENCOUNTER — Emergency Department (HOSPITAL_COMMUNITY): Payer: Medicaid Other

## 2015-06-03 ENCOUNTER — Encounter (HOSPITAL_COMMUNITY): Payer: Self-pay

## 2015-06-03 ENCOUNTER — Inpatient Hospital Stay (HOSPITAL_COMMUNITY)
Admission: EM | Admit: 2015-06-03 | Discharge: 2015-06-07 | DRG: 291 | Disposition: A | Discharge status: Home / Self Care | Payer: Medicaid Other | Attending: Family Medicine | Admitting: Family Medicine

## 2015-06-03 DIAGNOSIS — I10 Essential (primary) hypertension: Secondary | ICD-10-CM | POA: Diagnosis not present

## 2015-06-03 DIAGNOSIS — Z951 Presence of aortocoronary bypass graft: Secondary | ICD-10-CM

## 2015-06-03 DIAGNOSIS — J9601 Acute respiratory failure with hypoxia: Secondary | ICD-10-CM | POA: Diagnosis present

## 2015-06-03 DIAGNOSIS — Z7982 Long term (current) use of aspirin: Secondary | ICD-10-CM | POA: Diagnosis not present

## 2015-06-03 DIAGNOSIS — E1151 Type 2 diabetes mellitus with diabetic peripheral angiopathy without gangrene: Secondary | ICD-10-CM | POA: Diagnosis present

## 2015-06-03 DIAGNOSIS — M1711 Unilateral primary osteoarthritis, right knee: Secondary | ICD-10-CM | POA: Diagnosis present

## 2015-06-03 DIAGNOSIS — I5033 Acute on chronic diastolic (congestive) heart failure: Secondary | ICD-10-CM | POA: Diagnosis present

## 2015-06-03 DIAGNOSIS — M179 Osteoarthritis of knee, unspecified: Secondary | ICD-10-CM | POA: Diagnosis present

## 2015-06-03 DIAGNOSIS — E039 Hypothyroidism, unspecified: Secondary | ICD-10-CM | POA: Diagnosis present

## 2015-06-03 DIAGNOSIS — J810 Acute pulmonary edema: Secondary | ICD-10-CM

## 2015-06-03 DIAGNOSIS — Z87891 Personal history of nicotine dependence: Secondary | ICD-10-CM | POA: Diagnosis not present

## 2015-06-03 DIAGNOSIS — J449 Chronic obstructive pulmonary disease, unspecified: Secondary | ICD-10-CM | POA: Diagnosis present

## 2015-06-03 DIAGNOSIS — Z794 Long term (current) use of insulin: Secondary | ICD-10-CM | POA: Diagnosis not present

## 2015-06-03 DIAGNOSIS — D631 Anemia in chronic kidney disease: Secondary | ICD-10-CM | POA: Diagnosis present

## 2015-06-03 DIAGNOSIS — R0600 Dyspnea, unspecified: Secondary | ICD-10-CM | POA: Diagnosis not present

## 2015-06-03 DIAGNOSIS — D472 Monoclonal gammopathy: Secondary | ICD-10-CM | POA: Diagnosis present

## 2015-06-03 DIAGNOSIS — K5900 Constipation, unspecified: Secondary | ICD-10-CM | POA: Diagnosis not present

## 2015-06-03 DIAGNOSIS — N19 Unspecified kidney failure: Secondary | ICD-10-CM | POA: Insufficient documentation

## 2015-06-03 DIAGNOSIS — I130 Hypertensive heart and chronic kidney disease with heart failure and stage 1 through stage 4 chronic kidney disease, or unspecified chronic kidney disease: Secondary | ICD-10-CM | POA: Diagnosis present

## 2015-06-03 DIAGNOSIS — E1122 Type 2 diabetes mellitus with diabetic chronic kidney disease: Secondary | ICD-10-CM | POA: Diagnosis present

## 2015-06-03 DIAGNOSIS — N184 Chronic kidney disease, stage 4 (severe): Secondary | ICD-10-CM | POA: Diagnosis present

## 2015-06-03 DIAGNOSIS — R079 Chest pain, unspecified: Secondary | ICD-10-CM | POA: Diagnosis present

## 2015-06-03 DIAGNOSIS — I2510 Atherosclerotic heart disease of native coronary artery without angina pectoris: Secondary | ICD-10-CM | POA: Diagnosis present

## 2015-06-03 DIAGNOSIS — R0602 Shortness of breath: Secondary | ICD-10-CM | POA: Diagnosis present

## 2015-06-03 DIAGNOSIS — I252 Old myocardial infarction: Secondary | ICD-10-CM

## 2015-06-03 DIAGNOSIS — E1121 Type 2 diabetes mellitus with diabetic nephropathy: Secondary | ICD-10-CM | POA: Diagnosis not present

## 2015-06-03 DIAGNOSIS — I5031 Acute diastolic (congestive) heart failure: Secondary | ICD-10-CM | POA: Diagnosis not present

## 2015-06-03 DIAGNOSIS — I1A0 Resistant hypertension: Secondary | ICD-10-CM | POA: Diagnosis present

## 2015-06-03 DIAGNOSIS — I509 Heart failure, unspecified: Secondary | ICD-10-CM

## 2015-06-03 DIAGNOSIS — E782 Mixed hyperlipidemia: Secondary | ICD-10-CM | POA: Diagnosis present

## 2015-06-03 DIAGNOSIS — E1129 Type 2 diabetes mellitus with other diabetic kidney complication: Secondary | ICD-10-CM | POA: Diagnosis present

## 2015-06-03 DIAGNOSIS — M25569 Pain in unspecified knee: Secondary | ICD-10-CM | POA: Insufficient documentation

## 2015-06-03 LAB — TROPONIN I
TROPONIN I: 0.03 ng/mL (ref <0.031)
Troponin I: 0.04 ng/mL — ABNORMAL HIGH (ref <0.031)

## 2015-06-03 LAB — CBC WITH DIFFERENTIAL/PLATELET
BASOS ABS: 0 10*3/uL (ref 0.0–0.1)
Basophils Relative: 0 %
EOS ABS: 0.5 10*3/uL (ref 0.0–0.7)
Eosinophils Relative: 5 %
HEMATOCRIT: 28.8 % — AB (ref 39.0–52.0)
HEMOGLOBIN: 8.6 g/dL — AB (ref 13.0–17.0)
LYMPHS PCT: 13 %
Lymphs Abs: 1.2 10*3/uL (ref 0.7–4.0)
MCH: 21.9 pg — ABNORMAL LOW (ref 26.0–34.0)
MCHC: 29.9 g/dL — AB (ref 30.0–36.0)
MCV: 73.3 fL — ABNORMAL LOW (ref 78.0–100.0)
MONOS PCT: 6 %
Monocytes Absolute: 0.6 10*3/uL (ref 0.1–1.0)
NEUTROS ABS: 7.3 10*3/uL (ref 1.7–7.7)
NEUTROS PCT: 76 %
Platelets: 186 10*3/uL (ref 150–400)
RBC: 3.93 MIL/uL — ABNORMAL LOW (ref 4.22–5.81)
RDW: 18.7 % — ABNORMAL HIGH (ref 11.5–15.5)
WBC: 9.6 10*3/uL (ref 4.0–10.5)

## 2015-06-03 LAB — BASIC METABOLIC PANEL
ANION GAP: 11 (ref 5–15)
BUN: 42 mg/dL — ABNORMAL HIGH (ref 6–20)
CALCIUM: 8.7 mg/dL — AB (ref 8.9–10.3)
CO2: 20 mmol/L — AB (ref 22–32)
Chloride: 109 mmol/L (ref 101–111)
Creatinine, Ser: 2.62 mg/dL — ABNORMAL HIGH (ref 0.61–1.24)
GFR, EST AFRICAN AMERICAN: 28 mL/min — AB (ref >60)
GFR, EST NON AFRICAN AMERICAN: 24 mL/min — AB (ref >60)
GLUCOSE: 256 mg/dL — AB (ref 65–99)
POTASSIUM: 4.1 mmol/L (ref 3.5–5.1)
Sodium: 140 mmol/L (ref 135–145)

## 2015-06-03 LAB — GLUCOSE, CAPILLARY
GLUCOSE-CAPILLARY: 249 mg/dL — AB (ref 65–99)
Glucose-Capillary: 219 mg/dL — ABNORMAL HIGH (ref 65–99)

## 2015-06-03 LAB — CBC
HCT: 25.6 % — ABNORMAL LOW (ref 39.0–52.0)
HEMOGLOBIN: 7.6 g/dL — AB (ref 13.0–17.0)
MCH: 21.9 pg — AB (ref 26.0–34.0)
MCHC: 29.7 g/dL — AB (ref 30.0–36.0)
MCV: 73.8 fL — ABNORMAL LOW (ref 78.0–100.0)
Platelets: 168 10*3/uL (ref 150–400)
RBC: 3.47 MIL/uL — ABNORMAL LOW (ref 4.22–5.81)
RDW: 18.9 % — AB (ref 11.5–15.5)
WBC: 9.5 10*3/uL (ref 4.0–10.5)

## 2015-06-03 LAB — CBG MONITORING, ED
GLUCOSE-CAPILLARY: 250 mg/dL — AB (ref 65–99)
Glucose-Capillary: 276 mg/dL — ABNORMAL HIGH (ref 65–99)

## 2015-06-03 LAB — CREATININE, SERUM
CREATININE: 2.72 mg/dL — AB (ref 0.61–1.24)
GFR calc Af Amer: 27 mL/min — ABNORMAL LOW (ref >60)
GFR calc non Af Amer: 23 mL/min — ABNORMAL LOW (ref >60)

## 2015-06-03 LAB — I-STAT TROPONIN, ED: TROPONIN I, POC: 0.02 ng/mL (ref 0.00–0.08)

## 2015-06-03 LAB — MRSA PCR SCREENING: MRSA BY PCR: NEGATIVE

## 2015-06-03 LAB — BRAIN NATRIURETIC PEPTIDE: B NATRIURETIC PEPTIDE 5: 489.2 pg/mL — AB (ref 0.0–100.0)

## 2015-06-03 MED ORDER — HYDRALAZINE HCL 20 MG/ML IJ SOLN
5.0000 mg | Freq: Once | INTRAMUSCULAR | Status: AC
  Administered 2015-06-03: 5 mg via INTRAVENOUS
  Filled 2015-06-03: qty 1

## 2015-06-03 MED ORDER — PREDNISOLONE ACETATE 1 % OP SUSP
1.0000 [drp] | Freq: Four times a day (QID) | OPHTHALMIC | Status: DC
  Administered 2015-06-03 – 2015-06-07 (×14): 1 [drp] via OPHTHALMIC
  Filled 2015-06-03: qty 1

## 2015-06-03 MED ORDER — ATORVASTATIN CALCIUM 40 MG PO TABS
40.0000 mg | ORAL_TABLET | Freq: Every day | ORAL | Status: DC
  Administered 2015-06-03 – 2015-06-06 (×4): 40 mg via ORAL
  Filled 2015-06-03 (×4): qty 1

## 2015-06-03 MED ORDER — HEPARIN SODIUM (PORCINE) 5000 UNIT/ML IJ SOLN
5000.0000 [IU] | Freq: Three times a day (TID) | INTRAMUSCULAR | Status: DC
  Administered 2015-06-03 – 2015-06-07 (×11): 5000 [IU] via SUBCUTANEOUS
  Filled 2015-06-03 (×11): qty 1

## 2015-06-03 MED ORDER — AMLODIPINE BESYLATE 10 MG PO TABS
10.0000 mg | ORAL_TABLET | Freq: Every day | ORAL | Status: DC
  Administered 2015-06-03 – 2015-06-07 (×5): 10 mg via ORAL
  Filled 2015-06-03 (×4): qty 1
  Filled 2015-06-03: qty 2

## 2015-06-03 MED ORDER — FUROSEMIDE 10 MG/ML IJ SOLN
60.0000 mg | Freq: Two times a day (BID) | INTRAMUSCULAR | Status: DC
  Administered 2015-06-03 – 2015-06-04 (×2): 60 mg via INTRAVENOUS
  Filled 2015-06-03 (×3): qty 6

## 2015-06-03 MED ORDER — INSULIN ASPART 100 UNIT/ML ~~LOC~~ SOLN
0.0000 [IU] | Freq: Every day | SUBCUTANEOUS | Status: DC
  Administered 2015-06-03: 2 [IU] via SUBCUTANEOUS

## 2015-06-03 MED ORDER — FUROSEMIDE 10 MG/ML IJ SOLN: 40.0000 mg | Freq: Two times a day (BID) | INTRAMUSCULAR | Status: DC

## 2015-06-03 MED ORDER — NITROGLYCERIN 0.4 MG SL SUBL: 0.4000 mg | SUBLINGUAL_TABLET | SUBLINGUAL | Status: DC | PRN

## 2015-06-03 MED ORDER — CARVEDILOL 25 MG PO TABS
50.0000 mg | ORAL_TABLET | Freq: Two times a day (BID) | ORAL | Status: DC
  Administered 2015-06-03 – 2015-06-07 (×9): 50 mg via ORAL
  Filled 2015-06-03: qty 2
  Filled 2015-06-03: qty 4
  Filled 2015-06-03 (×7): qty 2

## 2015-06-03 MED ORDER — INSULIN ASPART 100 UNIT/ML ~~LOC~~ SOLN
0.0000 [IU] | Freq: Three times a day (TID) | SUBCUTANEOUS | Status: DC
  Administered 2015-06-03: 5 [IU] via SUBCUTANEOUS

## 2015-06-03 MED ORDER — INSULIN GLARGINE 100 UNIT/ML ~~LOC~~ SOLN
35.0000 [IU] | Freq: Two times a day (BID) | SUBCUTANEOUS | Status: DC
  Administered 2015-06-03 – 2015-06-06 (×6): 35 [IU] via SUBCUTANEOUS
  Filled 2015-06-03 (×10): qty 0.35

## 2015-06-03 MED ORDER — NITROGLYCERIN IN D5W 200-5 MCG/ML-% IV SOLN
5.0000 ug/min | INTRAVENOUS | Status: DC
  Administered 2015-06-03: 5 ug/min via INTRAVENOUS
  Filled 2015-06-03: qty 250

## 2015-06-03 MED ORDER — CLONIDINE HCL 0.3 MG PO TABS
0.3000 mg | ORAL_TABLET | Freq: Three times a day (TID) | ORAL | Status: DC
  Administered 2015-06-03 – 2015-06-07 (×13): 0.3 mg via ORAL
  Filled 2015-06-03 (×2): qty 1
  Filled 2015-06-03: qty 3
  Filled 2015-06-03 (×6): qty 1
  Filled 2015-06-03: qty 3
  Filled 2015-06-03 (×3): qty 1

## 2015-06-03 MED ORDER — FUROSEMIDE 10 MG/ML IJ SOLN
40.0000 mg | Freq: Once | INTRAMUSCULAR | Status: AC
  Administered 2015-06-03: 40 mg via INTRAVENOUS
  Filled 2015-06-03: qty 4

## 2015-06-03 MED ORDER — KETOROLAC TROMETHAMINE 0.5 % OP SOLN
1.0000 [drp] | Freq: Four times a day (QID) | OPHTHALMIC | Status: DC
  Administered 2015-06-03 – 2015-06-05 (×9): 1 [drp] via OPHTHALMIC
  Filled 2015-06-03: qty 3

## 2015-06-03 MED ORDER — HYDRALAZINE HCL 20 MG/ML IJ SOLN
5.0000 mg | INTRAMUSCULAR | Status: DC | PRN
  Administered 2015-06-03: 5 mg via INTRAVENOUS
  Filled 2015-06-03: qty 1

## 2015-06-03 MED ORDER — ASPIRIN EC 81 MG PO TBEC
81.0000 mg | DELAYED_RELEASE_TABLET | Freq: Every day | ORAL | Status: DC
  Administered 2015-06-03 – 2015-06-07 (×5): 81 mg via ORAL
  Filled 2015-06-03 (×5): qty 1

## 2015-06-03 NOTE — Progress Notes (Signed)
Patient taken off Bipap to rest on [place]. RN and MD aware.

## 2015-06-03 NOTE — ED Provider Notes (Signed)
CSN: [id number]     Arrival date & time    History  By signing my name below, I, Hansel Feinstein, attest that this documentation has been prepared under the direction and in the presence of Ripley Fraise, MD. Electronically Signed: Hansel Feinstein, ED Scribe. 06/03/2015. 2:00 AM.    Chief Complaint  Patient presents with  . Respiratory Distress     LEVEL 5 CAVEAT: HPI and ROS limited due to respiratory distress    Patient is a 65 y.o. male presenting with shortness of breath. The history is provided by the patient and the EMS personnel. The history is limited by the condition of the patient. No language interpreter was used.  Shortness of Breath Severity:  Moderate Onset quality:  Gradual Duration:  2 weeks Timing:  Constant Progression:  Worsening Chronicity:  Chronic Context: activity   Associated symptoms: chest pain   Associated symptoms: no abdominal pain   Chest pain:    Quality:  Pressure   HPI Comments: BRISCO BOLEYN is a 65 y.o. male brought in by ambulance with h/o HTN, PAD, DMII, MI, CHF on Carvedilol who presents to the Emergency Department on CPAP complaining of moderate, exertional SOB onset 2 weeks ago and worsened this evening. Pt called EMS for substernal CP and pressure along with his worsened SOB. He also reported worsening bilateral ankle swelling with his current symptoms. Per EMS, systolic BP [id number] en route. O2 sat 82% on RA and 96% on NRB. 1 NTG was administered en route. Pt denies abdominal pain and back pain.   Past Medical History  Diagnosis Date  . Hypertension   . PAD (peripheral artery disease) ([place])   . Gangrene of toe ([place])     right 5th/notes 01/04/2013   . High cholesterol   . Type II diabetes mellitus ([place])   . MGUS (monoclonal gammopathy of unknown significance) 01/15/2015   Past Surgical History  Procedure Laterality Date  . Throat surgery  1983    polyps  . Angioplasty / stenting femoral Right 01/13/2013    superficial femoral artery x 2 (3  mm x 60 mm, 4 mm x 60 mm)  . Tonsillectomy and adenoidectomy  ~ 1965  . Amputation Right 01/28/2013    Procedure: RAY AMPUTATION RIGHT  4th & 5th TOE;  Surgeon: Rosetta Posner, MD;  Location: [facility] OR;  Service: Vascular;  Laterality: Right;  . Coronary artery bypass graft N/A 04/07/2013    Procedure: CORONARY ARTERY BYPASS GRAFTING (CABG);  Surgeon: Ivin Poot, MD;  Location: [facility];  Service: Open Heart Surgery;  Laterality: N/A;  . Intraoperative transesophageal echocardiogram N/A 04/07/2013    Procedure: INTRAOPERATIVE TRANSESOPHAGEAL ECHOCARDIOGRAM;  Surgeon: Ivin Poot, MD;  Location: [facility];  Service: Open Heart Surgery;  Laterality: N/A;  . Lower extremity angiogram Bilateral 01/06/2013    Procedure: LOWER EXTREMITY ANGIOGRAM;  Surgeon: Conrad Okarche, MD;  Location: [facility] CATH LAB;  Service: Cardiovascular;  Laterality: Bilateral;  . Lower extremity angiogram Right 01/13/2013    Procedure: LOWER EXTREMITY ANGIOGRAM;  Surgeon: Conrad Buckland, MD;  Location: [facility] CATH LAB;  Service: Cardiovascular;  Laterality: Right;  . Left heart catheterization with coronary angiogram N/A 04/05/2013    Procedure: LEFT HEART CATHETERIZATION WITH CORONARY ANGIOGRAM;  Surgeon: Peter M Martinique, MD;  Location: [facility] CATH LAB;  Service: Cardiovascular;  Laterality: N/A;   Family History  Problem Relation Age of Onset  . Cancer Mother     lung cancer  . Hypertension Mother   .  Cancer Sister     patient thinks it was uterine cancer  . Hypertension Sister   . Heart attack Sister   . Stroke Neg Hx    Social History  Substance Use Topics  . Smoking status: Former Smoker -- 1.00 packs/day for 46 years    Types: Cigarettes    Start date: 02/04/1967    Quit date: 02/03/2013  . Smokeless tobacco: Never Used     Comment: Doing well with quitting.  . Alcohol Use: No    Review of Systems  Unable to perform ROS: Acuity of condition  Respiratory: Positive for shortness of breath.   Cardiovascular: Positive for chest  pain.  Gastrointestinal: Negative for abdominal pain.  Musculoskeletal: Negative for back pain.   Allergies  Review of patient's allergies indicates no known allergies.  Home Medications   Prior to Admission medications   Medication Sig Start Date End Date Taking? Authorizing Provider  amLODipine (NORVASC) 10 MG tablet Take 1 tablet (10 mg total) by mouth daily. 03/23/15   Zenia Resides, MD  aspirin 81 MG tablet Take 1 tablet (81 mg total) by mouth daily. Patient not taking: Reported on 05/17/2015 03/23/15   Zenia Resides, MD  atorvastatin (LIPITOR) 40 MG tablet TAKE 1 TABLET BY MOUTH EVERY DAY AT 6 PM 03/23/15   Zenia Resides, MD  carvedilol (COREG) 25 MG tablet Take 2 tablets (50 mg total) by mouth 2 (two) times daily with a meal. 04/05/15   Zenia Resides, MD  cloNIDine (CATAPRES) 0.3 MG tablet Take 1 tablet (0.3 mg total) by mouth 3 (three) times daily. 04/05/15   Zenia Resides, MD  furosemide (LASIX) 40 MG tablet Take 1.5 tablets (60 mg total) by mouth 2 (two) times daily. 1 and 1/2 tablet daily 04/05/15   Zenia Resides, MD  hydrochlorothiazide (HYDRODIURIL) 25 MG tablet Take 1 tablet (25 mg total) by mouth daily. 05/03/15   Zenia Resides, MD  insulin aspart (NOVOLOG FLEXPEN) 100 UNIT/ML FlexPen Inject 35 Units into the skin 3 (three) times daily with meals. 05/17/15   Zenia Resides, MD  Insulin Glargine (LANTUS SOLOSTAR) 100 UNIT/ML Solostar Pen Inject 65 Units into the skin 2 (two) times daily. 05/03/15   Zenia Resides, MD  Insulin Pen Needle (B-D ULTRAFINE III SHORT PEN) 31G X 8 MM MISC Use with insulin pen twice daily 04/02/15   Rosemarie Ax, MD   BP 167/95 mmHg  Pulse 80  Temp(Src) 97.9 F (36.6 C) (Axillary)  Resp 16  SpO2 96% Physical Exam CONSTITUTIONAL: Well developed/well nourished. Distress noted.  HEAD: Normocephalic/atraumatic EYES: EOMI/PERRL ENMT: Mucous membranes moist. CPAP mask in place.  NECK: supple no meningeal signs SPINE/BACK:entire spine  nontender CV: Tachycardic. No loud murmurs.  LUNGS: Tachypnic. Scattered wheezing. Decreased breath sounds bilaterally.  ABDOMEN: soft, nontender, no rebound or guarding, bowel sounds noted throughout abdomen GU:no cva tenderness NEURO: Pt is awake/alert/appropriate, moves all extremitiesx4.     EXTREMITIES: pulses normal/equal, full ROM. Symmetric pitting edema to BLE.  SKIN: warm, color normal PSYCH: no abnormalities of mood noted, alert and oriented to situation   ED Course  Procedures  DIAGNOSTIC STUDIES: Oxygen Saturation is 97% on CPAP, normal by my interpretation.    COORDINATION OF CARE: 1:50 AM Discussed treatment plan with pt at bedside which includes lab work, CXR, EKG, hospital admission and pt agreed to plan.  Pt seen on arrival as he was on CPAP He was in obvious pulmonary edema He was  continued on Non-invasive ventilation NTG drip and lasix started Pt improving, resting comfortably Will admit D/w family medicine resident, will place in stepdown unit Patient/family updated on plan   Labs Review Labs Reviewed  BASIC METABOLIC PANEL - Abnormal; Notable for the following:    CO2 20 (*)    Glucose, Bld 256 (*)    BUN 42 (*)    Creatinine, Ser 2.62 (*)    Calcium 8.7 (*)    GFR calc non Af Amer 24 (*)    GFR calc Af Amer 28 (*)    All other components within normal limits  CBC WITH DIFFERENTIAL/PLATELET - Abnormal; Notable for the following:    RBC 3.93 (*)    Hemoglobin 8.6 (*)    HCT 28.8 (*)    MCV 73.3 (*)    MCH 21.9 (*)    MCHC 29.9 (*)    RDW 18.7 (*)    All other components within normal limits  BRAIN NATRIURETIC PEPTIDE - Abnormal; Notable for the following:    B Natriuretic Peptide 489.2 (*)    All other components within normal limits  I-STAT TROPOININ, ED    Imaging Review Dg Chest Portable 1 View  06/03/2015  CLINICAL DATA:  Shortness of breath and chest pain EXAM: PORTABLE CHEST 1 VIEW COMPARISON:  02/05/2015 FINDINGS: Cardiomegaly with  stable aortic contours. The patient is status post CABG. New interstitial coarsening with occasional Kerley line. No effusion. IMPRESSION: Mild CHF. Electronically Signed   By: Monte Fantasia M.D.   On: 06/03/2015 02:03   I have personally reviewed and evaluated these images and lab results as part of my medical decision-making.   EKG Interpretation   Date/Time:  Sunday June 03 2015 01:51:30 EDT Ventricular Rate:  78 PR Interval:  171 QRS Duration: 102 QT Interval:  422 QTC Calculation: 481 R Axis:   55 Text Interpretation:  Sinus rhythm Probable left atrial enlargement  Anteroseptal infarct, old No significant change since last tracing  Confirmed by Christy Gentles  MD, Lavinia Mcneely ([id number]) on 06/03/2015 2:07:10 AM       CRITICAL CARE Performed by: Ripley Fraise, MD   Total critical care time: 40 minutes Critical care time was exclusive of separately billable procedures and treating other patients. Critical care was necessary to treat or prevent imminent or life-threatening deterioration. Critical care was time spent personally by me on the following activities: development of treatment plan with patient and/or surrogate as well as nursing, discussions with consultants, evaluation of patient's response to treatment, examination of patient, obtaining history from patient or surrogate, ordering and performing treatments and interventions, ordering and review of laboratory studies, ordering and review of radiographic studies, pulse oximetry and re-evaluation of patient's condition.  PATIENT ON NITROGLYCERIN DRIP PT ON BIPAP PT ADMITTED TO STEPDOWN UNIT  MDM   Final diagnoses:  Acute pulmonary edema (HCC)  Renal failure    Nursing notes including past medical history and social history reviewed and considered in documentation xrays/imaging reviewed by myself and considered during evaluation Labs/vital reviewed myself and considered during evaluation    I personally performed the  services described in this documentation, which was scribed in my presence. The recorded information has been reviewed and is accurate.       Ripley Fraise, MD 06/03/15 [phone]

## 2015-06-03 NOTE — ED Notes (Addendum)
CBG 276. Rn notified.

## 2015-06-03 NOTE — ED Notes (Signed)
PER EMS: pt from home, called EMS for non-radiating chest pressure (onset 2 weeks ago) associated with SOB exacerbated when exerting and laying down. EMS arrived and noticed pt having increased work of breathing with 82% room air saturation. NRB applied and O2 sats increased to 96% but work of breathing did not improve so EMS started CPAP en route. 1 nitro and 324 mg aspirin given by EMS. Initial BP was 220/110, HR- 88 NSR. A&Ox4.

## 2015-06-03 NOTE — Progress Notes (Signed)
Utilization review completed.  

## 2015-06-03 NOTE — ED Notes (Signed)
Talked with admitting MD, made aware pt is back on Bipap, BP 196/85. Reports can pause the Bipap and give oral medications, then restart Bipap.

## 2015-06-03 NOTE — ED Notes (Signed)
Paged admitting MD, made aware that his BP is 199/93. Just gave his Coreg 50 mg tablet, was late due to being on Bipap. She reports wait 1 hr from time of coreg admin and then give clonidine and amlodipine.

## 2015-06-03 NOTE — ED Notes (Signed)
Pt requesting to go back on Bipap, 94% 4 L [place]. Respiratory at bedside to reapply Bipap.

## 2015-06-03 NOTE — ED Notes (Signed)
Pt trailed off Bipap, on 4L at 98%.

## 2015-06-03 NOTE — H&P (Signed)
[facility] Admission History and Physical Service Pager: [phone]  [name] Medical record number: [id number] Date of birth: 1950-11-06 Age: 65 y.o. Gender: male  Primary Care Provider: Clearance Coots, MD Consultants: cardiology Code Status: full. Obtained on admission  Chief Complaint: chest tightness  Assessment and Plan: Leonard [name] is a 65 y.o. male presenting with chest pain and dyspnea . PMH is significant for HFpEF, CAD s/p 3 vessel CABG in 04/2013, PAD, HTN, HLD, and tobacco abuse (quit 2 year ago).  Dyspnea: likely 2/2 CHF exacerbation. Chest pain atypical (pain with lying). Reports recent weight gain. Lung exams with bibasilar crackles. He has 2+ pitting edema to his knee level bilaterally. BNP 489. CXR with with pulmonary congestion. Symptoms improved with nitro drip and lasix. Doubt ACS but significant cardiac history. Doubt PE without tachypnea and tachycardia. No leukocytosis or sign of infection to suspect pneumonia.  -Admit to SDU. Attending Dr. Mingo Amber - S/p IV lasix 40 mg in ED - IV lasix 60 mg twice a day  - Daily weights, strict I/O. Dry weight: 232 per patient. - 2D echocardiogram. Previous echo in 12/2014 with EF of 55-60%, no structural or motion abnormalities - Continue BiPAP - Oxygen as needed  - continue cardiac monitor  - nothing by mouth with sips for meds while on BiPAP   Atypical Chest pain: Likely due to CHF exacerbation. Pain was non-exertional per patient's description. She also reports belching. However given his significant cardiac history will cycle his troponin and EKG to rule out ACS. - Card consult - Repeat ECG in AM (T-wave changes in lateral leads in ED) - Cycle troponins (1st is negative in ED) - status post aspirin and Nitro by EMS, and Nitro drip in the ED - TSH, Hb A1c. 12 in January 2017,   - Lipitor continue home aspirin and  - Morphine 2 mg IV q2h prn chest pain  - Nitroglycerin  0.4mg  SL q66min prn chest pain  - O2 by [place] prn hypoxemia - Echocardiogram as above - protonix for 40 mg daily   CAD status post triple bypass in March 2015/PAD/HLD: Had lipid panel in March 2017. He is on carvedilol and Lipitor and aspirin at home -will continue home meds  Accelerated hypertension: BP 220/110 on arrival to ED. Started on nitro drip and IV Lasix. BP dropped to 168/78 on admission. No sign of end organ damage. -we will continue to monitor -continue amlodipine, clonidine & CAD medications as above  Diabetes-2: A1c 12 in January 2017. Blood glucose 256 on arrival. He uses Lantus 65 units twice a day and NovoLog 35 units 3 times a day with meals at home. He did not use his insulin the evening of admission. -we'll continue Lantus 65 units daily -Sliding scale moderate sensitivity -Monitor his CBG  every 4 while nothing by mouth -we will check his A1c  COPD: in his problem list. However, he is not on any breathing treatments. He had significant smoking history. His lung exam is negative for wheezing. -We'll monitor respiratory status -Can't start albuterol as needed  Anemia: hemoglobin 8.6, MCV 73%. Ferritin is elevated to 443 in January 2016. TIBC and iron levels were in normal. He reports belching. Denies hematochezia or melena - CBC in the morning - consider anemia panel  FEN/GI:  -NPO while on BiPAP  Prophylaxis: heparin sq  Disposition: SDU while on BIPAP  History of Present Illness:  Leonard [name] is a 65 y.o. male presenting  with chest pain and dyspnea.  Patient reports having intermittent substernal chest pain that he describes as tightness for the last two weeks. Yesterday in the evening, he had similar chest pain & dyspnea when he laid down. His pain persisted so he had to call EMS. He says his chest pain and dyspnea has resolved now. Per ED report, patent with dyspnea, hypoxia and tachypnea when EMS arrived at his house. They gave him nitro & ASA en route to  ED. On arrival to ED, BP 220/110. Started on nitro drip & IV lasix and CPAP. He desated to 80% on CPAP. So switched to BIPAP.  He reports some fatigue recently. He thinks his legs have swollen more. He also reports some weight gain. He says his recent weight is 240 lbs from 232 lbs. At baseline he is able to walk about 30 yards before his leg pain stops him. He denies dyspnea with ambulation. He denies cough, fever, nausea, diaphoresis, orthopnea, emesis, abdominal pain, dysuria or leg pain. He reports some loose stool yesterday. It wasn't bloody or melanotic.   Reports taking all his medications yesterday in the morning.  He is not on oxygen at home  He has history of 1PPD before two years, denies ETOH or drug use.  ED course: BP 220/110 on arrival. Started on nitro drip & IV lasix. BP trended down to 168/78. Desated to 81% on CPAP and transitioned to BIPAP. Hgb 8.6. BNP 489. CXR with pulmonary congestion. EKG with some T-wave changes in the lateral leads. Troponin negative.   Review Of Systems: Per HPI  Otherwise the remainder of the systems were negative.  Patient Active Problem List   Diagnosis Date Noted  . Acute CHF (congestive heart failure) ([place]) 06/03/2015  . Skin lesion of face 02/22/2015  . 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 Anemia in chronic kidney disease 01/15/2015  . CKD (chronic kidney disease) stage 4, GFR 15-29 ml/min ([facility]) 01/15/2015  . Hypothyroidism 11/22/2014  . Health care maintenance 11/22/2014  . Acute on chronic diastolic heart failure ([place]) 07/04/2014  . Tooth pain 05/11/2014  . Chronic diastolic heart failure ([place]) 03/16/2014  . COPD (chronic obstructive pulmonary disease) ([place]) 02/16/2014  . Diastolic heart failure ([place])   . Bilateral lower extremity edema 10/14/2013  . History of tobacco use 08/23/2013  . 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 CAD (coronary artery disease) 04/06/2013  . NSTEMI (non-ST elevated myocardial infarction)  ([place]) 04/05/2013  . PVD - hx of Rt SFA PTA and s/p Rt 4-5th toe amp Dec 2014 04/05/2013  . Atherosclerosis of native arteries of the extremities with gangrene ([place]) 02/15/2013  . Normocytic anemia 01/26/2013  . Essential hypertension, benign 11/25/2012  . DM (diabetes mellitus), type 2 with renal complications ([place]) [id number]  . Mixed hyperlipidemia 07/17/2008    Past Medical History: Past Medical History  Diagnosis Date  . Hypertension   . PAD (peripheral artery disease) ([place])   . Gangrene of toe ([place])     right 5th/notes 01/04/2013   . High cholesterol   . Type II diabetes mellitus ([place])   . MGUS (monoclonal gammopathy of unknown significance) 01/15/2015    Past Surgical History: Past Surgical History  Procedure Laterality Date  . Throat surgery  1983    polyps  . Angioplasty / stenting femoral Right 01/13/2013    superficial femoral artery x 2 (3 mm x 60 mm, 4 mm x 60 mm)  . Tonsillectomy and adenoidectomy  ~ 1965  .  Amputation Right 01/28/2013    Procedure: RAY AMPUTATION RIGHT  4th & 5th TOE;  Surgeon: Rosetta Posner, MD;  Location: [facility];  Service: Vascular;  Laterality: Right;  . Coronary artery bypass graft N/A 04/07/2013    Procedure: CORONARY ARTERY BYPASS GRAFTING (CABG);  Surgeon: Ivin Poot, MD;  Location: [place];  Service: Open Heart Surgery;  Laterality: N/A;  . Intraoperative transesophageal echocardiogram N/A 04/07/2013    Procedure: INTRAOPERATIVE TRANSESOPHAGEAL ECHOCARDIOGRAM;  Surgeon: Ivin Poot, MD;  Location: [place];  Service: Open Heart Surgery;  Laterality: N/A;  . Lower extremity angiogram Bilateral 01/06/2013    Procedure: LOWER EXTREMITY ANGIOGRAM;  Surgeon: Conrad [place], MD;  Location: [facility] CATH LAB;  Service: Cardiovascular;  Laterality: Bilateral;  . Lower extremity angiogram Right 01/13/2013    Procedure: LOWER EXTREMITY ANGIOGRAM;  Surgeon: Conrad [place], MD;  Location: [facility] CATH LAB;  Service: Cardiovascular;  Laterality: Right;  . Left heart  catheterization with coronary angiogram N/A 04/05/2013    Procedure: LEFT HEART CATHETERIZATION WITH CORONARY ANGIOGRAM;  Surgeon: Peter M Martinique, MD;  Location: [facility] CATH LAB;  Service: Cardiovascular;  Laterality: N/A;    Social History: Social History  Substance Use Topics  . Smoking status: Former Smoker -- 1.00 packs/day for 46 years    Types: Cigarettes    Start date: 02/04/1967    Quit date: 02/03/2013  . Smokeless tobacco: Never Used     Comment: Doing well with quitting.  . Alcohol Use: No   Additional social history:   Please also refer to relevant sections of EMR.  Family History: Family History  Problem Relation Age of Onset  . Cancer Mother     lung cancer  . Hypertension Mother   . Cancer Sister     patient thinks it was uterine cancer  . Hypertension Sister   . Heart attack Sister   . Stroke Neg Hx    (If not completed, MUST add something in)  Allergies and Medications: No Known Allergies No current facility-administered medications on file prior to encounter.   Current Outpatient Prescriptions on File Prior to Encounter  Medication Sig Dispense Refill  . amLODipine (NORVASC) 10 MG tablet Take 1 tablet (10 mg total) by mouth daily. 90 tablet 1  . aspirin 81 MG tablet Take 1 tablet (81 mg total) by mouth daily. (Patient not taking: Reported on 05/17/2015) 30 tablet 3  . atorvastatin (LIPITOR) 40 MG tablet TAKE 1 TABLET BY MOUTH EVERY DAY AT 6 PM 90 tablet 1  . carvedilol (COREG) 25 MG tablet Take 2 tablets (50 mg total) by mouth 2 (two) times daily with a meal. 180 tablet 3  . cloNIDine (CATAPRES) 0.3 MG tablet Take 1 tablet (0.3 mg total) by mouth 3 (three) times daily. 270 tablet 3  . furosemide (LASIX) 40 MG tablet Take 1.5 tablets (60 mg total) by mouth 2 (two) times daily. 1 and 1/2 tablet daily 135 tablet 3  . hydrochlorothiazide (HYDRODIURIL) 25 MG tablet Take 1 tablet (25 mg total) by mouth daily. 90 tablet 1  . insulin aspart (NOVOLOG FLEXPEN) 100  UNIT/ML FlexPen Inject 35 Units into the skin 3 (three) times daily with meals. 15 mL 6  . Insulin Glargine (LANTUS SOLOSTAR) 100 UNIT/ML Solostar Pen Inject 65 Units into the skin 2 (two) times daily. 15 mL 3  . Insulin Pen Needle (B-D ULTRAFINE III SHORT PEN) 31G X 8 MM MISC Use with insulin pen twice daily 100 each 2  Objective: BP 168/78 mmHg  Pulse 66  Temp(Src) 97.9 F (36.6 C) (Axillary)  Resp 21  SpO2 100% Exam: GEN: lying in bed with BIPAP, appears well, NAD Oropharynx: clear, moist Neck: supple, no LAD CVS: RRR, normal s1 and s2, 2/6 SEM, 2+ pitting edema RESP: some increased work of breathing, good air movement bilaterally, bibasilar crackles, no wheeze GI: soft, non-tender, gaseous distention, +BS MSK: no tenderness to palpation  NEURO: A&O x3, no gross defecits  PSYCH: appropriate mood and affect   Labs and Imaging: CBC BMET   Recent Labs Lab 06/03/15 0225  WBC 9.6  HGB 8.6*  HCT 28.8*  PLT 186    Recent Labs Lab 06/03/15 0225  NA 140  K 4.1  CL 109  CO2 20*  BUN 42*  CREATININE 2.62*  GLUCOSE 256*  CALCIUM 8.7*     Dg Chest Portable 1 View  06/03/2015  CLINICAL DATA:  Shortness of breath and chest pain EXAM: PORTABLE CHEST 1 VIEW COMPARISON:  02/05/2015 FINDINGS: Cardiomegaly with stable aortic contours. The patient is status post CABG. New interstitial coarsening with occasional Kerley line. No effusion. IMPRESSION: Mild CHF. Electronically Signed   By: Monte Fantasia M.D.   On: 06/03/2015 02:03    Mercy Riding, MD 06/03/2015, 3:54 AM PGY-1, [place] Intern pager: [phone], text pages welcome  I have read and agree with the amended note as above.  Phill Myron, MD, PGY-3 7:23 AM

## 2015-06-03 NOTE — ED Notes (Signed)
Paged and spoke with admitting MD. Made aware BP has increased again. Last BP 190/85. Reports they will order something, also he can eat.

## 2015-06-04 ENCOUNTER — Ambulatory Visit: Payer: Medicaid Other | Admitting: Family Medicine

## 2015-06-04 DIAGNOSIS — E1121 Type 2 diabetes mellitus with diabetic nephropathy: Secondary | ICD-10-CM

## 2015-06-04 DIAGNOSIS — I110 Hypertensive heart disease with heart failure: Secondary | ICD-10-CM

## 2015-06-04 DIAGNOSIS — Z794 Long term (current) use of insulin: Secondary | ICD-10-CM

## 2015-06-04 DIAGNOSIS — R079 Chest pain, unspecified: Secondary | ICD-10-CM

## 2015-06-04 DIAGNOSIS — I5031 Acute diastolic (congestive) heart failure: Secondary | ICD-10-CM

## 2015-06-04 DIAGNOSIS — I5030 Unspecified diastolic (congestive) heart failure: Secondary | ICD-10-CM

## 2015-06-04 DIAGNOSIS — N184 Chronic kidney disease, stage 4 (severe): Secondary | ICD-10-CM

## 2015-06-04 DIAGNOSIS — I10 Essential (primary) hypertension: Secondary | ICD-10-CM

## 2015-06-04 LAB — GLUCOSE, CAPILLARY
GLUCOSE-CAPILLARY: 203 mg/dL — AB (ref 65–99)
Glucose-Capillary: 200 mg/dL — ABNORMAL HIGH (ref 65–99)
Glucose-Capillary: 231 mg/dL — ABNORMAL HIGH (ref 65–99)
Glucose-Capillary: 216 mg/dL — ABNORMAL HIGH (ref 65–99)

## 2015-06-04 LAB — CBC
HCT: 26.7 % — ABNORMAL LOW (ref 39.0–52.0)
HEMOGLOBIN: 8 g/dL — AB (ref 13.0–17.0)
MCH: 22.1 pg — ABNORMAL LOW (ref 26.0–34.0)
MCHC: 30 g/dL (ref 30.0–36.0)
MCV: 73.8 fL — ABNORMAL LOW (ref 78.0–100.0)
Platelets: 177 10*3/uL (ref 150–400)
RBC: 3.62 MIL/uL — ABNORMAL LOW (ref 4.22–5.81)
RDW: 19 % — ABNORMAL HIGH (ref 11.5–15.5)
WBC: 6.8 10*3/uL (ref 4.0–10.5)

## 2015-06-04 LAB — BASIC METABOLIC PANEL
Anion gap: 10 (ref 5–15)
BUN: 44 mg/dL — AB (ref 6–20)
CHLORIDE: 110 mmol/L (ref 101–111)
CO2: 21 mmol/L — AB (ref 22–32)
CREATININE: 2.5 mg/dL — AB (ref 0.61–1.24)
Calcium: 8.6 mg/dL — ABNORMAL LOW (ref 8.9–10.3)
GFR calc non Af Amer: 26 mL/min — ABNORMAL LOW (ref >60)
GFR, EST AFRICAN AMERICAN: 30 mL/min — AB (ref >60)
Glucose, Bld: 242 mg/dL — ABNORMAL HIGH (ref 65–99)
POTASSIUM: 3.8 mmol/L (ref 3.5–5.1)
Sodium: 141 mmol/L (ref 135–145)

## 2015-06-04 LAB — RETICULOCYTES
RBC.: 3.8 MIL/uL — ABNORMAL LOW (ref 4.22–5.81)
RETIC CT PCT: 2 % (ref 0.4–3.1)
Retic Count, Absolute: 76 10*3/uL (ref 19.0–186.0)

## 2015-06-04 LAB — HEMOGLOBIN A1C
HEMOGLOBIN A1C: 10.3 % — AB (ref 4.8–5.6)
Mean Plasma Glucose: 249 mg/dL

## 2015-06-04 LAB — IRON AND TIBC
IRON: 24 ug/dL — AB (ref 45–182)
SATURATION RATIOS: 6 % — AB (ref 17.9–39.5)
TIBC: 405 ug/dL (ref 250–450)
UIBC: 381 ug/dL

## 2015-06-04 LAB — FOLATE: Folate: 13.4 ng/mL (ref >5.9)

## 2015-06-04 LAB — FERRITIN: Ferritin: 44 ng/mL (ref 24–336)

## 2015-06-04 LAB — VITAMIN B12: VITAMIN B 12: 264 pg/mL (ref 180–914)

## 2015-06-04 MED ORDER — INSULIN ASPART 100 UNIT/ML ~~LOC~~ SOLN
0.0000 [IU] | Freq: Three times a day (TID) | SUBCUTANEOUS | Status: DC
  Administered 2015-06-04 (×2): 7 [IU] via SUBCUTANEOUS
  Administered 2015-06-04 – 2015-06-05 (×2): 4 [IU] via SUBCUTANEOUS
  Administered 2015-06-05 (×2): 7 [IU] via SUBCUTANEOUS
  Administered 2015-06-06 (×2): 4 [IU] via SUBCUTANEOUS
  Administered 2015-06-06: 7 [IU] via SUBCUTANEOUS
  Administered 2015-06-07: 15 [IU] via SUBCUTANEOUS
  Administered 2015-06-07: 11 [IU] via SUBCUTANEOUS

## 2015-06-04 MED ORDER — FUROSEMIDE 10 MG/ML IJ SOLN: 60.0000 mg | Freq: Three times a day (TID) | INTRAMUSCULAR | Status: DC

## 2015-06-04 MED ORDER — PANTOPRAZOLE SODIUM 40 MG PO TBEC
40.0000 mg | DELAYED_RELEASE_TABLET | Freq: Every day | ORAL | Status: DC
  Administered 2015-06-04 – 2015-06-07 (×4): 40 mg via ORAL
  Filled 2015-06-04 (×4): qty 1

## 2015-06-04 MED ORDER — INSULIN ASPART 100 UNIT/ML ~~LOC~~ SOLN
0.0000 [IU] | Freq: Every day | SUBCUTANEOUS | Status: DC
  Administered 2015-06-04 – 2015-06-05 (×2): 2 [IU] via SUBCUTANEOUS
  Administered 2015-06-06: 3 [IU] via SUBCUTANEOUS

## 2015-06-04 MED ORDER — HYDRALAZINE HCL 25 MG PO TABS
25.0000 mg | ORAL_TABLET | Freq: Three times a day (TID) | ORAL | Status: DC
  Administered 2015-06-04 – 2015-06-05 (×3): 25 mg via ORAL
  Filled 2015-06-04 (×3): qty 1

## 2015-06-04 MED ORDER — CETYLPYRIDINIUM CHLORIDE 0.05 % MT LIQD
7.0000 mL | Freq: Two times a day (BID) | OROMUCOSAL | Status: DC
  Administered 2015-06-04 – 2015-06-07 (×6): 7 mL via OROMUCOSAL

## 2015-06-04 MED ORDER — FUROSEMIDE 10 MG/ML IJ SOLN
80.0000 mg | Freq: Three times a day (TID) | INTRAMUSCULAR | Status: DC
  Administered 2015-06-04 – 2015-06-05 (×3): 80 mg via INTRAVENOUS
  Filled 2015-06-04 (×3): qty 8

## 2015-06-04 NOTE — Progress Notes (Signed)
Family Medicine Teaching Service Daily Progress Note Intern Pager: [phone]  [name] Medical record number: [id number] Date of birth: Sep 05, 1950 Age: 65 y.o. Gender: male  Primary Care Provider: Clearance Coots, MD Consultants: cardiology Code Status: full  Pt Overview and Major Events to Date:  4/30: admitted with CHF  Assessment and Plan: Leonard [name] is a 65 y.o. male presenting with chest pain and dyspnea . PMH is significant for HFpEF, CAD s/p 3 vessel CABG in 04/2013, PAD, HTN, HLD, and tobacco abuse (quit 2 year ago).  Dyspnea likely 2/2 CHF exacerbation: improved. Patient with LE edema, bibasilar crackles, pulm congestion on presentation requiring BIPAP on admission. Has been on 5L by [place] since yesterday. Down to 3L this morning. - S/p IV lasix 40 mg in ED - Continue IV lasix 60 mg twice a day  - I &O: UOP 2L/-1L/-2L - Daily Weight: 249>249 (d/t scales). Says his dry weight is 232 lbs - Echo in 12/2014 with EF of 55-60%, no structural or motion abnormalities - wean oxygen as able - continue cardiac monitor - F/u card recs  Atypical Chest pain: serial troponin negative. Initial EKG with some TW changes likely demand from CHF. EKG this am with ?ST elevation in V1 and V2. TSH normal. Hb A1c. 12 in January 2017. Reports belching on arrival - Card consulted - Continue home lipitor& aspirin - Nitroglycerin 0.4mg  SL q93min prn chest pain  - Wean oxygen as able - Echo - protonix for 40 mg daily   CAD status post triple bypass in March 2015/PAD/HLD: Had lipid panel in March 2017. He is on carvedilol and Lipitor and aspirin at home -will continue home meds -Card consult  Accelerated hypertension: BP 220/110 on arrival to ED. No sign of end organ damage. Started on nitro drip and IV Lasix. BP 163/74 this morning. -we will continue to monitor -PRN hydralazine-received once yesterday -continue amlodipine, clonidine & CAD medications as above  Diabetes-2:  A1c 12 in 02/2015. On Lantus 65 units twice a day and NovoLog 35 units 3 times a day with meals at home. CBG in mid 200's. -we'll continue Lantus 65 units daily -Change SSI from mod to resistant -CBG qACHS -f/u A1c  COPD: in his problem list. However, he is not on any breathing treatments. He had significant smoking history. His lung exam is negative for wheezing. -We'll monitor respiratory status -Can't start albuterol as needed  CKD-4: creatinine 2.5, 2.72 on admission. This seems to be his new baseline. -will monitor BMP  Anemia: Hgb 8; 8.6 on admission. Baseline 8-9.  MCV 74%. Ferritin is elevated to 443 in January 2016. TIBC and iron levels were in normal at that time. He reports belching. Denies hematochezia or melena - Monitor with daily CBC - consider anemia panel - transfusion threshold 8 given history of CAD.  FEN/GI:  -heart healthy/carb modified  Prophylaxis: heparin sq given his creatinine  Disposition: transfer to regular floor  Subjective:  Patient reports feeling rested. He denies shortness of breath or chest pain. Has been voiding well. He is on 5L oxygen. We cut down this to 3 L this morning.   Objective: Temp:  [97.8 F (36.6 C)-98.5 F (36.9 C)] 98.5 F (36.9 C) (05/01 0000) Pulse Rate:  [63-77] 65 (05/01 0000) Resp:  [13-25] 15 (05/01 0000) BP: (145-199)/(71-93) 175/76 mmHg (05/01 0000) SpO2:  [93 %-100 %] 99 % (05/01 0000) FiO2 (%):  [40 %] 40 % (04/30 1106) Weight:  [249 lb 12.5 oz (  113.3 kg)] 249 lb 12.5 oz (113.3 kg) (04/30 1758) Physical Exam: GEN: lying in bed with [place] in place, appears obese CVS: RRR, normal s1 and s2, 2/6 SEM, 2+ pitting edema RESP: good air movement bilaterally, bibasilar crackles, no wheeze GI: soft, non-tender, gaseous distention, +BS MSK: no tenderness to palpation  NEURO: A&O x3, no gross defecits  PSYCH: appropriate mood and affect   Laboratory:  Recent Labs Lab 06/03/15 0225 06/03/15 0540  WBC 9.6 9.5  HGB  8.6* 7.6*  HCT 28.8* 25.6*  PLT 186 168    Recent Labs Lab 06/03/15 0225 06/03/15 0540  NA 140  --   K 4.1  --   CL 109  --   CO2 20*  --   BUN 42*  --   CREATININE 2.62* 2.72*  CALCIUM 8.7*  --   GLUCOSE 256*  --     Imaging/Diagnostic Tests: No results found.  Mercy Riding, MD 06/04/2015, 2:02 AM PGY-1, [place] Intern pager: [phone], text pages welcome

## 2015-06-04 NOTE — Progress Notes (Signed)
Pt transferred from [facility]. Pt brought to the floor in stable condition. Vitals taken. All immediate pertinent needs to patient addressed. Patient oriented to room and safety precautions reviewed. Patient verbalized understanding.

## 2015-06-04 NOTE — Consult Note (Signed)
CARDIOLOGY CONSULT NOTE   Patient ID: Leonard [name] MRN: [id number] DOB/AGE: 64-Feb-1952 64 y.o.  Admit date: 06/03/2015  Requesting Physician: Dr. Mingo Amber  Primary Physician:   Clearance Coots, MD Primary Cardiologist:  Dr. Mare Ferrari  Reason for Consultation:  Chest pain/ CHF  HPI: Leonard [name] is a 65 y.o. male with a history of CAD s/p CABG x3V (04/2013), PAD s/p R femoral artery stenting (2014), DM, HTN, chronic diastolic CHF, CKD, COPD on home 02, chronic anemia, and previous heavy tobacco abuse who presented to [facility] on [facility]/03[facility]15 with chest pain and SOB.   He was last seen by Dr. Mare Ferrari in 01/2015 for general cardiology follow up and felt to be stable.   He presented with 2 week history of LE edema, abdominal distension, chest tightness and weakness. He had intermittant chest tightness which prompted him to present to the emergency department. He was found to be hypertensive with BP up to 220/110 and with acute respiratory failure requiring BIPAP. CXR with mild CHF. BNP 489. Troponin 0.04. Creat 2.72. Hg 8.6. He was started on IV nitro and lasix. BIPAP has been removed and he is now on 3L 02 by nasal cannula.   He did admit to eating some potato chips recently. He does not report significant SOB, orthopnea or PND. His chest tightness has resolved since being admitted. When he had his heart attack in 2015 he had profound SOB. No dizziness or syncope. Patient is somewhat of a difficult historian.   Past Medical History  Diagnosis Date  . Hypertension   . PAD (peripheral artery disease) ([place])   . Gangrene of toe ([place])     right 5th/notes 01/04/2013   . High cholesterol   . Type II diabetes mellitus ([place])   . MGUS (monoclonal gammopathy of unknown significance) 01/15/2015     Past Surgical History  Procedure Laterality Date  . Throat surgery  1983    polyps  . Angioplasty / stenting femoral Right 01/13/2013    superficial femoral artery x 2 (3 mm x 60 mm, 4 mm x 60  mm)  . Tonsillectomy and adenoidectomy  ~ 1965  . Amputation Right 01/28/2013    Procedure: RAY AMPUTATION RIGHT  4th & 5th TOE;  Surgeon: Rosetta Posner, MD;  [facility];  Service: Vascular;  Laterality: Right;  . Coronary artery bypass graft N/A 04/07/2013    Procedure: CORONARY ARTERY BYPASS GRAFTING (CABG);  Surgeon: Ivin Poot, MD;  Location: [place];  Service: Open Heart Surgery;  Laterality: N/A;  . Intraoperative transesophageal echocardiogram N/A 04/07/2013    Procedure: INTRAOPERATIVE TRANSESOPHAGEAL ECHOCARDIOGRAM;  Surgeon: Ivin Poot, MD;  Location: [place];  Service: Open Heart Surgery;  Laterality: N/A;  . Lower extremity angiogram Bilateral 01/06/2013    Procedure: LOWER EXTREMITY ANGIOGRAM;  Surgeon: Conrad [place], MD;  Location: [facility] CATH LAB;  Service: Cardiovascular;  Laterality: Bilateral;  . Lower extremity angiogram Right 01/13/2013    Procedure: LOWER EXTREMITY ANGIOGRAM;  Surgeon: Conrad [place], MD;  Location: [facility] CATH LAB;  Service: Cardiovascular;  Laterality: Right;  . Left heart catheterization with coronary angiogram N/A 04/05/2013    Procedure: LEFT HEART CATHETERIZATION WITH CORONARY ANGIOGRAM;  Surgeon: Peter M Martin[place], MD;  Location: [facility] CATH LAB;  Service: Cardiovascular;  Laterality: N/A;    No Known Allergies  I have reviewed the patient's current medications . amLODipine  10 mg Oral Daily  . antiseptic oral rinse  7 mL Mouth  Rinse BID  . aspirin EC  81 mg Oral Daily  . atorvastatin  40 mg Oral q1800  . carvedilol  50 mg Oral BID WC  . cloNIDine  0.3 mg Oral TID  . furosemide  60 mg Intravenous Q8H  . heparin  5,000 Units Subcutaneous Q8H  . insulin aspart  0-20 Units Subcutaneous TID WC  . insulin aspart  0-5 Units Subcutaneous QHS  . insulin glargine  35 Units Subcutaneous BID  . ketorolac  1 drop Right Eye QID  . pantoprazole  40 mg Oral Daily  . prednisoLONE acetate  1 drop Right Eye QID     hydrALAZINE, nitroGLYCERIN  Prior to Admission  medications   Medication Sig Start Date End Date Taking? Authorizing Provider  amLODipine (NORVASC) 10 MG tablet Take 1 tablet (10 mg total) by mouth daily. 03/23/15  Yes Zenia Resides, MD  atorvastatin (LIPITOR) 40 MG tablet TAKE 1 TABLET BY MOUTH EVERY DAY AT 6 PM 03/23/15  Yes Zenia Resides, MD  carvedilol (COREG) 25 MG tablet Take 2 tablets (50 mg total) by mouth 2 (two) times daily with a meal. 04/05/15  Yes Zenia Resides, MD  cloNIDine (CATAPRES) 0.3 MG tablet Take 1 tablet (0.3 mg total) by mouth 3 (three) times daily. 04/05/15  Yes Zenia Resides, MD  furosemide (LASIX) 40 MG tablet Take 1.5 tablets (60 mg total) by mouth 2 (two) times daily. 1 and 1/2 tablet daily 04/05/15  Yes Zenia Resides, MD  hydrALAZINE (APRESOLINE) 25 MG tablet Take 25 mg by mouth 2 (two) times daily.   Yes Historical Provider, MD  hydrochlorothiazide (HYDRODIURIL) 25 MG tablet Take 25 mg by mouth daily.   Yes Historical Provider, MD  insulin aspart (NOVOLOG FLEXPEN) 100 UNIT/ML FlexPen Inject 35 Units into the skin 3 (three) times daily with meals. 05/17/15  Yes Zenia Resides, MD  Insulin Glargine (LANTUS SOLOSTAR) 100 UNIT/ML Solostar Pen Inject 65 Units into the skin 2 (two) times daily. 05/03/15  Yes Zenia Resides, MD  Insulin Pen Needle (B-D ULTRAFINE III SHORT PEN) 31G X 8 MM MISC Use with insulin pen twice daily 04/02/15  Yes Rosemarie Ax, MD  PRESCRIPTION MEDICATION Take 1 tablet by mouth 2 (two) times daily. Replacement for HCTZ   Yes Historical Provider, MD     Social History   Social History  . Marital Status: Single    Spouse Name: N/A  . Number of Children: N/A  . Years of Education: N/A   Occupational History  . Not on file.   Social History Main Topics  . Smoking status: Former Smoker -- 1.00 packs/day for 46 years    Types: Cigarettes    Start date: 02/04/1967    Quit date: 02/03/2013  . Smokeless tobacco: Never Used     Comment: Doing well with quitting.  . Alcohol Use: No   . Drug Use: No  . Sexual Activity: Not Currently   Other Topics Concern  . Not on file   Social History Narrative   Lives with sister, [name] in [place].  Retired - former Sports coach.    Family Status  Relation Status Death Age  . Mother Deceased   . Sister Alive   . Father Deceased    Family History  Problem Relation Age of Onset  . Cancer Mother     lung cancer  . Hypertension Mother   . Cancer Sister     patient thinks it was uterine  cancer  . Hypertension Sister   . Heart attack Sister   . Stroke Neg Hx      ROS:  Full 14 point review of systems complete and found to be negative unless listed above.  Physical Exam: Blood pressure 167/72, pulse 64, temperature 97.9 F (36.6 C), temperature source Oral, resp. rate 16, height 5\' 8"  (1.727 m), weight 249 lb 12.5 oz (113.3 kg), SpO2 99 %.  General: Well developed, well nourished, male in no acute distress Head: Eyes PERRLA, No xanthomas.   Normocephalic and atraumatic, oropharynx without edema or exudate. Dentition:  Lungs: mild crackles at bases Heart: HRRR S1 S2, no rub/gallop, Heart regular rate and rhythm with S1, S2  murmur. pulses are 2+ extrem.   Neck: No carotid bruits. No lymphadenopathy. mild JVD. Abdomen: Bowel sounds present, abdomen soft and non-tender without masses or hernias noted. Msk:  No spine or cva tenderness. No weakness, no joint deformities or effusions. Extremities: No clubbing or cyanosis.  1+ edema bilateral LE edema  Neuro: Alert and oriented X 3. No focal deficits noted. Psych:  Good affect, responds appropriately Skin: No rashes or lesions noted.  Labs:   Lab Results  Component Value Date   WBC 6.8 06/04/2015   HGB 8.0* 06/04/2015   HCT 26.7* 06/04/2015   MCV 73.8* 06/04/2015   PLT 177 06/04/2015   No results for input(s): INR in the last 72 hours.  Recent Labs Lab 06/04/15 0421  NA 141  K 3.8  CL 110  CO2 21*  BUN 44*  CREATININE 2.50*  CALCIUM 8.6*  GLUCOSE  242*   MAGNESIUM  Date Value Ref Range Status  12/09/2013 1.7 1.5 - 2.5 mg/dL Final    Recent Labs  06/03/15 0540 06/03/15 1149 06/03/15 1736  TROPONINI 0.04* 0.03 <0.03    Recent Labs  06/03/15 0227  TROPIPOC 0.02   PRO B NATRIURETIC PEPTIDE (BNP)  Date/Time Value Ref Range Status  2020-12-713 09:05 PM 2795.0* 0 - 125 pg/mL Final  04/04/2013 06:25 AM 374.4* 0 - 125 pg/mL Final   Lab Results  Component Value Date   CHOL 107* 04/13/2015   HDL 22* 04/13/2015   LDLCALC 38 04/13/2015   TRIG 234* 04/13/2015    Echo: pending.   ECG:  Sinus HR 78. NO acute ST or TW changes.   Radiology:  Dg Chest Portable 1 View  06/03/2015  CLINICAL DATA:  Shortness of breath and chest pain EXAM: PORTABLE CHEST 1 VIEW COMPARISON:  02/05/2015 FINDINGS: Cardiomegaly with stable aortic contours. The patient is status post CABG. New interstitial coarsening with occasional Kerley line. No effusion. IMPRESSION: Mild CHF. Electronically Signed   By: Monte Fantasia M.D.   On: 06/03/2015 02:03    ASSESSMENT AND PLAN:    Principal Problem:   Acute CHF (congestive heart failure) (HCC) Active Problems:   DM (diabetes mellitus), type 2 with renal complications (HCC)   Essential hypertension, benign   Chest pain   Acute on chronic diastolic CHF: Patient presented with s/s consistent with volume overload initially requiring BIPAP therapy. CXR with CHF and BNP elevated ~500.  -- Started on IV lasix 60 mg BID. Net neg 2.6L. Weight stable (249-->249 lbs). Dry weight is 232 lbs -- Echo in 12/2014 with EF of 55-60%, no structural or motion abnormalities. Repeat 2D ECHO pending -- Creat improved with diuresis. Would continue IV lasix. He still appears volume overloaded. Will increase to IV lasix 80mg  BID  Atypical Chest pain: troponin 0.04--> 0.03--> 0.03.  ECG with no acute ST or TW changes. Some non specific ST/TW changes in lateral leads similar to previous. Likely related to chronic anemia and acute  CHF. Now resolved. Would proceed with nuclear stress test at some point. Would like to avoid cath with renal function.   Accelerated hypertension: BP 220/110 on arrival to ED. Started on nitro drip and IV Lasix. BP improving but still not well controlled on amlodipine 10mg , clonidine 0.3 TID, Coreg 50mg  BID. Will add 25mg  TID of hydralazine for afterload reduction and better BP control   CAD s/p CABG: Continue ASA, BB and statin.   Diabetes-2: A1c 12 in 02/2015. Management per IM.  COPD: he quit smoking 2 years ago.   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 Now down to 2.5 This seems to be his new baseline.  Anemia: Hgb 8; 8.6 on admission. Baseline 8-9. Iron studies per IM.    SignedCrista Luria 06/04/2015 11:44 AM  Pager [phone]  Co-Sign MD   Patient seen and examined. Agree with assessment and plan.  Mr. Leonard [name] is a 65 year old African-American male was a history of obesity, CAD who underwent CABG revascularization surgery 3 in March 2015, PVD status post right femoral artery stenting in [id number], diastolic heart failure with preserved LV function, diabetes mellitus, prior tobacco use, resulting in COPD on home O2, and chronic microcytic anemia.  An echo Doppler study in November 2016 showed an ejection fraction at 55-60% with abnormal tissue Doppler and moderate pulmonary hypertension with an estimated PA pressure 48 mm.  He has chronic kidney disease.  For the past several weeks he has noticed increasing leg swelling, abdominal distention, weakness, and has experience sensation of chest fullness.  He was omitted yesterday and was found to have accelerated hypertension with a BP at 220/110 and required BiPAP therapy for respiratory failure.  A chest x-ray revealed mild CHF.  BMP is suggestive of diastolic heart failure at 489.  He has had flat troponins suggesting demand ischemia.  He is breathing better with the addition of IV nitroglycerin and Lasix and is now on 3 L  supplemental oxygen by nasal cannula.  On exam.  Presently blood pressure is improved but still elevated at 168/80.  Pulses regular in the 60s to 70s.  There was mild increased JVD.  There were bilateral basilar rales.  Rhythm was regular with 1/6 systolic murmur.  Abdomen was soft and nontender.  There was no pedal jugular reflux.  He had at least 1+ bilateral lower extremity edema.  His ECG independently reviewed by me shows normal sinus rhythm at 69 bpm.  There were nonspecific ST-T changes.  I suspect most of his symptoms are related to his diastolic heart failure and accelerated hypertension.  Doubt acute coronary syndrome.  At present, recommend increasing IV Lasix to 80 mg twice a day for more effective diuresis and addition of hydralazine at 25 mg every 8 hours for initiation of afterload reduction.  A 2-D echo Doppler study will be done to reassess systolic and diastolic function.  The patient is 3 years status post CBG revascularization surgery.  Once he is euvolemic and compensated, I would recommend a Lexiscan Myoview study to assess for potential ischemia in the etiology of his CHF.  I would recommend iron studies with his microcytic indices.  We will follow the patient with you.  Troy Sine, MD, [facility] 06/04/2015 1:06 PM

## 2015-06-05 ENCOUNTER — Inpatient Hospital Stay (HOSPITAL_COMMUNITY): Payer: Medicaid Other

## 2015-06-05 DIAGNOSIS — N19 Unspecified kidney failure: Secondary | ICD-10-CM

## 2015-06-05 DIAGNOSIS — R0600 Dyspnea, unspecified: Secondary | ICD-10-CM

## 2015-06-05 LAB — BASIC METABOLIC PANEL
Anion gap: 10 (ref 5–15)
BUN: 46 mg/dL — AB (ref 6–20)
CHLORIDE: 107 mmol/L (ref 101–111)
CO2: 22 mmol/L (ref 22–32)
CREATININE: 2.77 mg/dL — AB (ref 0.61–1.24)
Calcium: 8.6 mg/dL — ABNORMAL LOW (ref 8.9–10.3)
GFR calc Af Amer: 26 mL/min — ABNORMAL LOW (ref >60)
GFR calc non Af Amer: 23 mL/min — ABNORMAL LOW (ref >60)
GLUCOSE: 225 mg/dL — AB (ref 65–99)
POTASSIUM: 4.1 mmol/L (ref 3.5–5.1)
Sodium: 139 mmol/L (ref 135–145)

## 2015-06-05 LAB — GLUCOSE, CAPILLARY
GLUCOSE-CAPILLARY: 213 mg/dL — AB (ref 65–99)
Glucose-Capillary: 201 mg/dL — ABNORMAL HIGH (ref 65–99)
Glucose-Capillary: 200 mg/dL — ABNORMAL HIGH (ref 65–99)
Glucose-Capillary: 239 mg/dL — ABNORMAL HIGH (ref 65–99)

## 2015-06-05 LAB — CBC
HEMATOCRIT: 25.4 % — AB (ref 39.0–52.0)
Hemoglobin: 7.7 g/dL — ABNORMAL LOW (ref 13.0–17.0)
MCH: 21.8 pg — AB (ref 26.0–34.0)
MCHC: 30.3 g/dL (ref 30.0–36.0)
MCV: 71.8 fL — AB (ref 78.0–100.0)
PLATELETS: 165 10*3/uL (ref 150–400)
RBC: 3.54 MIL/uL — ABNORMAL LOW (ref 4.22–5.81)
RDW: 19.1 % — AB (ref 11.5–15.5)
WBC: 7.2 10*3/uL (ref 4.0–10.5)

## 2015-06-05 LAB — ECHOCARDIOGRAM COMPLETE
HEIGHTINCHES: 68 in
WEIGHTICAEL: 3828.8 [oz_av]

## 2015-06-05 MED ORDER — HYDRALAZINE HCL 50 MG PO TABS
50.0000 mg | ORAL_TABLET | Freq: Three times a day (TID) | ORAL | Status: DC
  Administered 2015-06-05 – 2015-06-07 (×7): 50 mg via ORAL
  Filled 2015-06-05 (×7): qty 1

## 2015-06-05 MED ORDER — TROLAMINE SALICYLATE 10 % EX CREA
TOPICAL_CREAM | Freq: Two times a day (BID) | CUTANEOUS | Status: DC | PRN
  Administered 2015-06-05: 17:00:00 via TOPICAL
  Filled 2015-06-05: qty 85

## 2015-06-05 MED ORDER — SODIUM CHLORIDE 0.9 % IV SOLN
510.0000 mg | Freq: Once | INTRAVENOUS | Status: AC
  Administered 2015-06-05: 510 mg via INTRAVENOUS
  Filled 2015-06-05: qty 17

## 2015-06-05 MED ORDER — FUROSEMIDE 80 MG PO TABS
80.0000 mg | ORAL_TABLET | Freq: Two times a day (BID) | ORAL | Status: DC
  Administered 2015-06-05 – 2015-06-06 (×2): 80 mg via ORAL
  Filled 2015-06-05 (×2): qty 1

## 2015-06-05 MED ORDER — ACETAMINOPHEN 325 MG PO TABS
650.0000 mg | ORAL_TABLET | Freq: Four times a day (QID) | ORAL | Status: DC | PRN
  Administered 2015-06-05: 650 mg via ORAL
  Filled 2015-06-05: qty 2

## 2015-06-05 MED ORDER — POLYETHYLENE GLYCOL 3350 17 G PO PACK
17.0000 g | PACK | Freq: Once | ORAL | Status: AC
  Administered 2015-06-05: 17 g via ORAL
  Filled 2015-06-05: qty 1

## 2015-06-05 MED ORDER — POLYETHYLENE GLYCOL 3350 17 G PO PACK: 17.0000 g | PACK | Freq: Every day | ORAL | Status: DC | PRN

## 2015-06-05 NOTE — Progress Notes (Signed)
Family Medicine Teaching Service Daily Progress Note Intern Pager: [phone]  [name] Medical record number: [id number] Date of birth: 08/01/1950 Age: 65 y.o. Gender: male  Primary Care Provider: Clearance Coots, MD Consultants: cardiology Code Status: full  Pt Overview and Major Events to Date:  Leonard [name] is a 65 y.o. male presenting with chest pain and dyspnea . PMH is significant for HFpEF, CAD s/p 3 vessel CABG in 04/2013, PAD, HTN, HLD, and tobacco abuse (quit 2 year ago).  Dyspnea likely 2/2 CHF exacerbation: improved. Patient with LE edema, bibasilar crackles, pulm congestion on presentation requiring BIPAP on admission. Has been on 2L by [place] since yesterday.  - S/p IV lasix 40 mg in ED -Appreciate card recs;  - IV lasix 80 mg twice a day  - I &O: UOP 2.5L/-2L/-4L - Daily Weight: [phone]. Dy weight ~232 lbs - f/u Echo. EF of 55-60%, no structural or motion abnormalities in 12/2014 - wean oxygen as able - continue cardiac monitor - F/u card recs  Atypical Chest pain: serial troponin negative. Initial EKG with some TW changes likely demand from CHF. EKG this am with ?ST elevation in V1 and V2. TSH normal. Hb A1c. 12 in January 2017. Reports belching on arrival - Appreciate card recs  -Lexa scan when euvolumic - Continue home lipitor& aspirin - Nitroglycerin 0.4mg  SL q44min prn chest pain  - Wean oxygen as able - f/u Echo - protonix for 40 mg daily   CAD status post triple bypass in March 2015/PAD/HLD: had lipid panel in March 2017. He is on carvedilol and Lipitor and aspirin at home -will continue home meds -Card consult  Accelerated hypertension: BP 220/110 on arrival to ED. No sign of end organ damage. Started on nitro drip and IV Lasix. BP 163/74 this morning. -we will continue to monitor -Appreciate card recs  -Add hydralazine 25 mg three times a day  -continue amlodipine, clonidine & CAD medications as above  Diabetes-2: A1c 12 in  02/2015. On Lantus 65 units twice a day and NovoLog 35 units 3 times a day with meals at home. CBG in mid 200's. -we'll continue Lantus 65 units daily -Change SSI from mod to resistant -CBG qACHS -f/u A1c  COPD: in his problem list. However, he is not on any breathing treatments. He had significant smoking history. His lung exam is negative for wheezing. -We'll monitor respiratory status -Can't start albuterol as needed  CKD-4: creatinine 2.5>2.77, 2.72 on admission. Baseline ~2.5 -will monitor BMP  Anemia: Hgb 7.7, 8.6 on admission. Baseline 8-9. MCV 74%. Ferritin is elevated to 443 in January 2016. TIBC and iron levels were in normal at that time. He reports belching. Denies hematochezia or melena. Anemia panel suggestive for IDA. - Monitor with daily CBC - Iron transfusion - Will discuss about transfusion with card  FEN/GI:  -heart healthy/carb modified  Prophylaxis: heparin sq given his creatinine  Disposition: continue regular floor with tele  Subjective:  No acute event overngiht. He denies chest pain or shortness of breath.  Objective: Temp:  [97.9 F (36.6 C)-98.7 F (37.1 C)] 98.2 F (36.8 C) (05/02 0526) Pulse Rate:  [64-74] 74 (05/02 0526) Resp:  [15-16] 16 (05/02 0526) BP: (114-178)/(62-84) 114/84 mmHg (05/02 0526) SpO2:  [94 %-98 %] 94 % (05/02 0526) Weight:  [239 lb 4.8 oz (108.546 kg)-244 lb 6.4 oz (110.859 kg)] 239 lb 4.8 oz (108.546 kg) (05/02 0526) Physical Exam: GEN: lying in bed, appears obese CVS: RRR, normal s1 and  s2, 2/6 SEM, trace edema RESP: on room air, good air movement bilaterally, no crackles, no wheeze GI: soft, non-tender, gaseous distention, +BS MSK: no tenderness to palpation  NEURO: A&O x3, no gross defecits  PSYCH: appropriate mood and affect   Laboratory:  Recent Labs Lab 06/03/15 0540 06/04/15 0421 06/05/15 0411  WBC 9.5 6.8 7.2  HGB 7.6* 8.0* 7.7*  HCT 25.6* 26.7* 25.4*  PLT 168 177 165    Recent Labs Lab  06/03/15 0225 06/03/15 0540 06/04/15 0421 06/05/15 0411  NA 140  --  141 139  K 4.1  --  3.8 4.1  CL 109  --  110 107  CO2 20*  --  21* 22  BUN 42*  --  44* 46*  CREATININE 2.62* 2.72* 2.50* 2.77*  CALCIUM 8.7*  --  8.6* 8.6*  GLUCOSE 256*  --  242* 225*    Imaging/Diagnostic Tests: No results found.  Mercy Riding, MD 06/05/2015, 7:22 AM PGY-1, [place] Intern pager: [phone], text pages welcome

## 2015-06-05 NOTE — Progress Notes (Signed)
Inpatient Diabetes Program Recommendations  AACE/ADA: New Consensus Statement on Inpatient Glycemic Control (2015)  Target Ranges:  Prepandial:   less than 140 mg/dL      Peak postprandial:   less than 180 mg/dL (1-2 hours)      Critically ill patients:  140 - 180 mg/dL   Review of Glycemic Control  Inpatient Diabetes Program Recommendations:  Insulin - Meal Coverage: add Novolog 6 units TID with meals per Glycemic Control order-set Thank you  Raoul Pitch BSN, RN,CDE Inpatient Diabetes Coordinator [phone] (team pager)

## 2015-06-05 NOTE — Progress Notes (Signed)
BIPAP not needed at this time 

## 2015-06-05 NOTE — Progress Notes (Signed)
[name] Date of Encounter: 06/05/2015     Principal Problem:   Acute CHF (congestive heart failure) ([place]) Active Problems:   DM (diabetes mellitus), type 2 with renal complications (HCC)   Essential hypertension, benign   Chest pain    SUBJECTIVE  He has some positional right sided chest pain that comes and goes. Worse with certain positions but can't tell what. Dyspnea improved.   CURRENT MEDS . amLODipine  10 mg Oral Daily  . antiseptic oral rinse  7 mL Mouth Rinse BID  . aspirin EC  81 mg Oral Daily  . atorvastatin  40 mg Oral q1800  . carvedilol  50 mg Oral BID WC  . cloNIDine  0.3 mg Oral TID  . furosemide  80 mg Intravenous Q8H  . heparin  5,000 Units Subcutaneous Q8H  . hydrALAZINE  25 mg Oral Q8H  . insulin aspart  0-20 Units Subcutaneous TID WC  . insulin aspart  0-5 Units Subcutaneous QHS  . insulin glargine  35 Units Subcutaneous BID  . ketorolac  1 drop Right Eye QID  . pantoprazole  40 mg Oral Daily  . prednisoLONE acetate  1 drop Right Eye QID    OBJECTIVE  Filed Vitals:   06/05/15 0034 06/05/15 0525 06/05/15 0526 06/05/15 0800  BP: 160/68 114/62 114/84 170/67  Pulse: 70  74 72  Temp: 98.2 F (36.8 C)  98.2 F (36.8 C) 98.4 F (36.9 C)  TempSrc: Oral  Oral Oral  Resp: 16  16 16   Height:      Weight:   239 lb 4.8 oz (108.546 kg)   SpO2: 94%  94% 95%    Intake/Output Summary (Last 24 hours) at 06/05/15 1050 Last data filed at 06/05/15 0946  Gross per 24 hour  Intake    600 ml  Output   2750 ml  Net  -2150 ml   Filed Weights   06/04/15 0500 06/04/15 1528 06/05/15 0526  Weight: 249 lb 12.5 oz (113.3 kg) 244 lb 6.4 oz (110.859 kg) 239 lb 4.8 oz (108.546 kg)    PHYSICAL EXAM  General: Pleasant, NAD. Neuro: Alert and oriented X 3. Moves all extremities spontaneously. Psych: Normal affect. HEENT:  Normal  Neck: Supple without bruits or JVD. Lungs:  Resp regular and unlabored, CTA. Heart: RRR no s3, s4, or  murmurs. Abdomen: Soft, non-tender, mildly distended, BS + x 4.  Extremities: No clubbing, cyanosis or 1+ bilateral edema. DP/PT/Radials 2+ and equal bilaterally.  Accessory Clinical Findings  CBC  Recent Labs  06/03/15 0225  06/04/15 0421 06/05/15 0411  WBC 9.6  < > 6.8 7.2  NEUTROABS 7.3  --   --   --   HGB 8.6*  < > 8.0* 7.7*  HCT 28.8*  < > 26.7* 25.4*  MCV 73.3*  < > 73.8* 71.8*  PLT 186  < > 177 165  < > = values in this interval not displayed. Basic Metabolic Panel  Recent Labs  06/04/15 0421 06/05/15 0411  NA 141 139  K 3.8 4.1  CL 110 107  CO2 21* 22  GLUCOSE 242* 225*  BUN 44* 46*  CREATININE 2.50* 2.77*  CALCIUM 8.6* 8.6*   Liver Function Tests No results for input(s): AST, ALT, ALKPHOS, BILITOT, PROT, ALBUMIN in the last 72 hours. No results for input(s): LIPASE, AMYLASE in the last 72 hours. Cardiac Enzymes  Recent Labs  06/03/15 0540 06/03/15 1149 06/03/15 1736  TROPONINI 0.04* 0.03 <0.03  Recent Labs  06/03/15 0540  HGBA1C 10.3*   BNP (last 3 results)  Recent Labs  06/03/15 0225  BNP 489.2*    ProBNP (last 3 results) No results for input(s): PROBNP in the last 8760 hours.   TELE  NSR  Radiology/Studies  Dg Chest Portable 1 View  06/03/2015  CLINICAL DATA:  Shortness of breath and chest pain EXAM: PORTABLE CHEST 1 VIEW COMPARISON:  02/05/2015 FINDINGS: Cardiomegaly with stable aortic contours. The patient is status post CABG. New interstitial coarsening with occasional Kerley line. No effusion. IMPRESSION: Mild CHF. Electronically Signed   By: Monte Fantasia M.D.   On: 06/03/2015 02:03    ASSESSMENT AND PLAN  [name] is a 65 y.o. 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   Acute on chronic diastolic CHF:  Patient presented with s/s consistent with volume overload initially requiring BIPAP therapy. CXR with CHF and BNP elevated ~500.  -- Started on IV lasix 60 mg BID, which was increased to 80mg  BID yesterday. Net neg 4.6L. Weight down 10 lbs (249-->239 lbs). Dry weight is 232 lbs. Creat initially improved with diuresis (2.72--> 2.5) but now increased back to 2.77. Will switch to PO lasix 80mg  BID (previous home dose was 60mg  BID).  -- Echo in 12/2014 with EF of 55-60%, no structural or motion abnormalities. Repeat 2D ECHO pending ( I called down to make sure that echo is done today)   Atypical Chest pain: troponin 0.04--> 0.03--> 0.03. ECG with no acute ST or TW changes. Some non specific ST/TW changes in lateral leads similar to previous. Likely related to chronic anemia and acute CHF. He does have some positional right chest pain today. Would proceed with nuclear stress test at some point. Would like to avoid cath with renal function.   Accelerated hypertension: BP 220/110 on arrival to ED. Started on nitro drip and IV Lasix. BP was not well controlled on amlodipine 10mg , clonidine 0.3 TID, Coreg 50mg  BID. Hydralazine 25mg  TID added yesterday for afterload reduction and better BP control. BP still uncontrolled. Will increase to Hydralazine 50mg  TID.  CAD s/p CABG: Continue ASA, BB and statin.   Diabetes-2: A1c 12 in 02/2015. Management per IM.  COPD: he quit smoking 2 years ago. Was on home 02 but then this was discontinued.   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 Decreased to 2.5 yesterday and now back up to 2.77. This may be his baseline   Anemia: Hgb down to 7.7? MVC low. Work up per IM.  Would suggest iron studies  Judy Pimple PA-C  Pager [phone]  ------------------------------------------------------------------- 06/05/15 ECHO Study Conclusions  - Left ventricle: The cavity size was normal. Systolic function was  normal. The estimated ejection fraction was in the  range of 60%  to 65%. Wall motion was normal; there were no regional wall  motion abnormalities. Features are consistent with a pseudonormal  left ventricular filling pattern, with concomitant abnormal  relaxation and increased filling pressure (grade 2 diastolic  dysfunction). Doppler parameters are consistent with high  ventricular filling pressure. - Aortic valve: Transvalvular velocity was within the normal range.  There was no stenosis. There was trivial regurgitation. - Mitral valve: Calcified annulus. There was no regurgitation. - Right ventricle: The cavity size was normal. Wall  thickness was  normal. Systolic function was normal. - Atrial septum: No defect or patent foramen ovale was identified. - Tricuspid valve: There was trivial regurgitation. - Pulmonary arteries: Systolic pressure was within the normal  range. PA peak pressure: 24 mm Hg (S). - Inferior vena cava: The vessel was dilated. The respirophasic  diameter changes were in the normal range (>= 50%), consistent  with normal central venous pressure. - Pericardium, extracardiac: There was a left pleural effusion.  Patient seen and examined. Agree with assessment and plan. Echo as above. Good diuresis with net -4740 since admission on iv lasix.  Cr increased to 2.77; GFR 26 c/w stage 4 CKD.  Sinus rhythm is the 70's. No anginal symptoms.  Anemic with ferritin 44, but 6% Fe saturation. Would initiate Fe supplementation.  Consider renal and  GI evaluation.   Troy Sine, MD, [facility] 06/05/2015 5:28 PM

## 2015-06-05 NOTE — Progress Notes (Signed)
Echocardiogram 2D Echocardiogram has been performed.  Leonard [name] 06/05/2015, 3:57 PM

## 2015-06-06 LAB — CBC
HEMATOCRIT: 25.1 % — AB (ref 39.0–52.0)
Hemoglobin: 7.8 g/dL — ABNORMAL LOW (ref 13.0–17.0)
MCH: 22.6 pg — ABNORMAL LOW (ref 26.0–34.0)
MCHC: 31.1 g/dL (ref 30.0–36.0)
MCV: 72.8 fL — AB (ref 78.0–100.0)
Platelets: 159 10*3/uL (ref 150–400)
RBC: 3.45 MIL/uL — AB (ref 4.22–5.81)
RDW: 19.1 % — AB (ref 11.5–15.5)
WBC: 7.7 10*3/uL (ref 4.0–10.5)

## 2015-06-06 LAB — BASIC METABOLIC PANEL
Anion gap: 11 (ref 5–15)
BUN: 46 mg/dL — ABNORMAL HIGH (ref 6–20)
CALCIUM: 8.6 mg/dL — AB (ref 8.9–10.3)
CO2: 22 mmol/L (ref 22–32)
CREATININE: 2.84 mg/dL — AB (ref 0.61–1.24)
Chloride: 107 mmol/L (ref 101–111)
GFR calc non Af Amer: 22 mL/min — ABNORMAL LOW (ref >60)
GFR, EST AFRICAN AMERICAN: 26 mL/min — AB (ref >60)
GLUCOSE: 196 mg/dL — AB (ref 65–99)
Potassium: 3.6 mmol/L (ref 3.5–5.1)
Sodium: 140 mmol/L (ref 135–145)

## 2015-06-06 LAB — GLUCOSE, CAPILLARY
GLUCOSE-CAPILLARY: 193 mg/dL — AB (ref 65–99)
Glucose-Capillary: 178 mg/dL — ABNORMAL HIGH (ref 65–99)
Glucose-Capillary: 209 mg/dL — ABNORMAL HIGH (ref 65–99)
Glucose-Capillary: 270 mg/dL — ABNORMAL HIGH (ref 65–99)

## 2015-06-06 LAB — PREPARE RBC (CROSSMATCH)

## 2015-06-06 MED ORDER — FUROSEMIDE 40 MG PO TABS
60.0000 mg | ORAL_TABLET | Freq: Two times a day (BID) | ORAL | Status: DC
  Administered 2015-06-07: 60 mg via ORAL
  Filled 2015-06-06: qty 1

## 2015-06-06 MED ORDER — POLYETHYLENE GLYCOL 3350 17 G PO PACK: 17.0000 g | PACK | Freq: Every day | ORAL | Status: DC | PRN

## 2015-06-06 MED ORDER — ACETAMINOPHEN 500 MG PO TABS: 500.0000 mg | ORAL_TABLET | Freq: Four times a day (QID) | ORAL | Status: DC | PRN

## 2015-06-06 MED ORDER — INSULIN GLARGINE 100 UNIT/ML ~~LOC~~ SOLN
45.0000 [IU] | Freq: Two times a day (BID) | SUBCUTANEOUS | Status: DC
  Administered 2015-06-06 – 2015-06-07 (×2): 45 [IU] via SUBCUTANEOUS
  Filled 2015-06-06 (×3): qty 0.45

## 2015-06-06 MED ORDER — ASPIRIN 81 MG PO TBEC: 81.0000 mg | DELAYED_RELEASE_TABLET | Freq: Every day | ORAL | Status: DC

## 2015-06-06 MED ORDER — PANTOPRAZOLE SODIUM 40 MG PO TBEC: 40.0000 mg | DELAYED_RELEASE_TABLET | Freq: Every day | ORAL | Status: DC

## 2015-06-06 MED ORDER — NITROGLYCERIN 0.4 MG SL SUBL: 0.4000 mg | SUBLINGUAL_TABLET | SUBLINGUAL | Status: DC | PRN

## 2015-06-06 MED ORDER — SODIUM CHLORIDE 0.9 % IV SOLN: Freq: Once | INTRAVENOUS | Status: DC

## 2015-06-06 NOTE — Progress Notes (Signed)
[name] Date of Encounter: 06/06/2015     Principal Problem:   Acute CHF (congestive heart failure) ([place]) Active Problems:   DM (diabetes mellitus), type 2 with renal complications (HCC)   Essential hypertension, benign   Chest pain   Renal failure    SUBJECTIVE  Feeling well today. No complaints of chest pain/dyspnea.   CURRENT MEDS . sodium chloride   Intravenous Once  . amLODipine  10 mg Oral Daily  . antiseptic oral rinse  7 mL Mouth Rinse BID  . aspirin EC  81 mg Oral Daily  . atorvastatin  40 mg Oral q1800  . carvedilol  50 mg Oral BID WC  . cloNIDine  0.3 mg Oral TID  . [START ON 06/07/2015] furosemide  60 mg Oral BID  . heparin  5,000 Units Subcutaneous Q8H  . hydrALAZINE  50 mg Oral Q8H  . insulin aspart  0-20 Units Subcutaneous TID WC  . insulin aspart  0-5 Units Subcutaneous QHS  . insulin glargine  35 Units Subcutaneous BID  . pantoprazole  40 mg Oral Daily  . prednisoLONE acetate  1 drop Right Eye QID    OBJECTIVE  Filed Vitals:   06/05/15 2008 06/06/15 0549 06/06/15 1145 06/06/15 1422  BP: 162/72 166/67 155/67 155/66  Pulse: 66 66 63 64  Temp: 98.1 F (36.7 C) 98 F (36.7 C) 98.3 F (36.8 C) 97.9 F (36.6 C)  TempSrc: Oral Oral Oral Oral  Resp: 18 18 20 20   Height:  5\' 8"  (1.727 m)    Weight:  236 lb 3.2 oz (107.14 kg)    SpO2: 93% 93% 98% 95%    Intake/Output Summary (Last 24 hours) at 06/06/15 1427 Last data filed at 06/06/15 1343  Gross per 24 hour  Intake    700 ml  Output   1950 ml  Net  -1250 ml   Filed Weights   06/04/15 1528 06/05/15 0526 06/06/15 0549  Weight: 244 lb 6.4 oz (110.859 kg) 239 lb 4.8 oz (108.546 kg) 236 lb 3.2 oz (107.14 kg)    PHYSICAL EXAM  General: Pleasant, NAD. Neuro: Alert and oriented X 3. Moves all extremities spontaneously. Psych: Normal affect. HEENT:  Normal  Neck: Supple without bruits or JVD. Lungs:  Resp regular and unlabored, CTA. Heart: RRR no s3, s4, or  murmurs. Abdomen: Soft, non-tender, mildly distended, BS + x 4.  Extremities: No clubbing, cyanosis or 1+ bilateral edema. DP/PT/Radials 2+ and equal bilaterally.  Accessory Clinical Findings  CBC  Recent Labs  06/05/15 0411 06/06/15 0236  WBC 7.2 7.7  HGB 7.7* 7.8*  HCT 25.4* 25.1*  MCV 71.8* 72.8*  PLT 165 Q000111Q   Basic Metabolic Panel  Recent Labs  06/05/15 0411 06/06/15 0236  NA 139 140  K 4.1 3.6  CL 107 107  CO2 22 22  GLUCOSE 225* 196*  BUN 46* 46*  CREATININE 2.77* 2.84*  CALCIUM 8.6* 8.6*   Cardiac Enzymes  Recent Labs  06/03/15 1736  TROPONINI <0.03   BNP (last 3 results)  Recent Labs  06/03/15 0225  BNP 489.2*    TELE  NSR  Radiology/Studies  Dg Knee Complete 4 Views Right  06/05/2015  CLINICAL DATA:  Right knee pain.  No known injury. EXAM: RIGHT KNEE - COMPLETE 4+ VIEW COMPARISON:  11/25/2014 FINDINGS: No fracture or dislocation. 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 There is  minimal spurring of the tibial spines. No evidence of chondrocalcinosis. Query small ankle joint effusion. Extensive adjacent vascular calcifications. No radiopaque foreign body. IMPRESSION: 1. Small joint effusion.  Otherwise, no acute findings. 2. 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 3. Extensive adjacent vascular calcifications. Correlation for symptoms of PAD is recommended. Electronically Signed   By: Sandi Mariscal M.D.   On: 06/05/2015 16:31    ASSESSMENT AND PLAN  Leonard [name] is a 65 y.o. 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   Acute on chronic diastolic CHF: Patient presented with s/s consistent with volume overload  initially requiring BIPAP therapy. CXR with CHF and BNP elevated ~500.  -- Started on IV lasix 60 mg BID, which was increased to 80mg  BID yesterday. Net neg 4.6L. Weight down 13 lbs (249-->236 lbs). Dry weight is 232 lbs. Creat initially improved with diuresis (2.72--> 2.5) but now increased back to 2.84. Switched to PO lasix 80mg  BID yesterday. Will hold pm dose of lasix and then decrease lasix dose to 60mg  BID which is home dose. -- Echo in 12/2014 with EF of 55-60%, no structural or motion abnormalities. Repeat 2D ECHO showed normal EF of 60-65%, with G2DD with PA peak pressure of 55mm Hg. -Given this rise in Cr, consider renal consult?  Atypical Chest pain: troponin 0.04--> 0.03--> 0.03. ECG with no acute ST or TW changes. Some non specific ST/TW changes in lateral leads similar to previous. Likely related to chronic anemia and acute CHF. -would consider outpatient nuclear stress test  Accelerated hypertension: BP 220/110 on arrival to ED. Started on nitro drip and IV Lasix. BP was not well controlled on amlodipine 10mg , clonidine 0.3 TID, Coreg 50mg  BID. Hydralazine increasedto 50mg  TID with improvement.  CAD s/p CABG: Continue ASA, BB and statin.   Diabetes-2: A1c 12 in 02/2015. Management per IM.  COPD: he quit smoking 2 years ago. Was on home 02 but then this was discontinued.   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 Decreased to 2.5 yesterday and now back up to 2.84. Reported baseline 2.5  Anemia:Hgb 8.6>>7.7>7.8. Baseline 8-9. MCV 74%. Denies hematochezia or melena. Transfusion ordered per admitting team  Signed, Reino Bellis, NP-C Pager [phone]  Patient seen and examined. Agree with assessment and plan. I/O [phone];  breating better; less edema.  Sinus rhythm in the 70's. Cr slightly increased; now on decreased lasix.   Troy Sine, MD, [facility] 06/06/2015 6:19 PM

## 2015-06-06 NOTE — Progress Notes (Signed)
Patient's family brought in clarification that currently he is only taking prenisolone eye drop 4 times a day and not the ketorolac.  MD made aware.

## 2015-06-06 NOTE — Progress Notes (Deleted)
[name] Date of Encounter: 06/06/2015     Principal Problem:   Acute CHF (congestive heart failure) ([place]) Active Problems:   DM (diabetes mellitus), type 2 with renal complications (HCC)   Essential hypertension, benign   Chest pain   Renal failure    SUBJECTIVE  Feeling well today. No complaints of chest pain/dyspnea.   CURRENT MEDS . sodium chloride   Intravenous Once  . amLODipine  10 mg Oral Daily  . antiseptic oral rinse  7 mL Mouth Rinse BID  . aspirin EC  81 mg Oral Daily  . atorvastatin  40 mg Oral q1800  . carvedilol  50 mg Oral BID WC  . cloNIDine  0.3 mg Oral TID  . furosemide  80 mg Oral BID  . heparin  5,000 Units Subcutaneous Q8H  . hydrALAZINE  50 mg Oral Q8H  . insulin aspart  0-20 Units Subcutaneous TID WC  . insulin aspart  0-5 Units Subcutaneous QHS  . insulin glargine  35 Units Subcutaneous BID  . ketorolac  1 drop Right Eye QID  . pantoprazole  40 mg Oral Daily  . prednisoLONE acetate  1 drop Right Eye QID    OBJECTIVE  Filed Vitals:   06/05/15 1427 06/05/15 2008 06/06/15 0549 06/06/15 1145  BP: 163/68 162/72 166/67 155/67  Pulse:  66 66 63  Temp:  98.1 F (36.7 C) 98 F (36.7 C) 98.3 F (36.8 C)  TempSrc:  Oral Oral Oral  Resp:  18 18 20   Height:   5\' 8"  (1.727 m)   Weight:   236 lb 3.2 oz (107.14 kg)   SpO2:  93% 93% 98%    Intake/Output Summary (Last 24 hours) at 06/06/15 1241 Last data filed at 06/06/15 0600  Gross per 24 hour  Intake    837 ml  Output   1350 ml  Net   -513 ml   Filed Weights   06/04/15 1528 06/05/15 0526 06/06/15 0549  Weight: 244 lb 6.4 oz (110.859 kg) 239 lb 4.8 oz (108.546 kg) 236 lb 3.2 oz (107.14 kg)    PHYSICAL EXAM  General: Pleasant, NAD. Neuro: Alert and oriented X 3. Moves all extremities spontaneously. Psych: Normal affect. HEENT:  Normal  Neck: Supple without bruits or JVD. Lungs:  Resp regular and unlabored, CTA. Heart: RRR no s3, s4, or murmurs. Abdomen: Soft,  non-tender, mildly distended, BS + x 4.  Extremities: No clubbing, cyanosis or 1+ bilateral edema. DP/PT/Radials 2+ and equal bilaterally.  Accessory Clinical Findings  CBC  Recent Labs  06/05/15 0411 06/06/15 0236  WBC 7.2 7.7  HGB 7.7* 7.8*  HCT 25.4* 25.1*  MCV 71.8* 72.8*  PLT 165 Q000111Q   Basic Metabolic Panel  Recent Labs  06/05/15 0411 06/06/15 0236  NA 139 140  K 4.1 3.6  CL 107 107  CO2 22 22  GLUCOSE 225* 196*  BUN 46* 46*  CREATININE 2.77* 2.84*  CALCIUM 8.6* 8.6*   Cardiac Enzymes  Recent Labs  06/03/15 1736  TROPONINI <0.03   BNP (last 3 results)  Recent Labs  06/03/15 0225  BNP 489.2*    TELE  NSR  Radiology/Studies  Dg Knee Complete 4 Views Right  06/05/2015  CLINICAL DATA:  Right knee pain.  No known injury. EXAM: RIGHT KNEE - COMPLETE 4+ VIEW COMPARISON:  11/25/2014 FINDINGS: No fracture or dislocation. 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 There is minimal spurring of the tibial spines. No evidence of chondrocalcinosis. Query small ankle joint effusion. Extensive adjacent vascular calcifications. No radiopaque foreign body. IMPRESSION: 1. Small joint effusion.  Otherwise, no acute findings. 2. 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 3. Extensive adjacent vascular calcifications. Correlation for symptoms of PAD is recommended. Electronically Signed   By: Sandi Mariscal M.D.   On: 06/05/2015 16:31    ASSESSMENT AND PLAN  Leonard [name] is a 65 y.o. 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   Acute on chronic diastolic CHF: Patient presented with s/s consistent with volume overload initially requiring BIPAP  therapy. CXR with CHF and BNP elevated ~500.  -- Started on IV lasix 60 mg BID, which was increased to 80mg  BID yesterday. Net neg 4.6L. Weight down 13 lbs (249-->236 lbs). Dry weight is 232 lbs. Creat initially improved with diuresis (2.72--> 2.5) but now increased back to 2.84. Switched to PO lasix 80mg  BID yesterday. Will hold pm dose of lasix and then decrease lasix dose to 60mg  BID which is home dose. -- Echo in 12/2014 with EF of 55-60%, no structural or motion abnormalities. Repeat 2D ECHO showed normal EF of 60-65%, with G2DD with PA peak pressure of 33mm Hg. -Given this rise in Cr, consider renal consult?  Atypical Chest pain: troponin 0.04--> 0.03--> 0.03. ECG with no acute ST or TW changes. Some non specific ST/TW changes in lateral leads similar to previous. Likely related to chronic anemia and acute CHF. -would consider outpatient nuclear stress test  Accelerated hypertension: BP 220/110 on arrival to ED. Started on nitro drip and IV Lasix. BP was not well controlled on amlodipine 10mg , clonidine 0.3 TID, Coreg 50mg  BID. Hydralazine increasedto 50mg  TID with improvement.  CAD s/p CABG: Continue ASA, BB and statin.   Diabetes-2: A1c 12 in 02/2015. Management per IM.  COPD: he quit smoking 2 years ago. Was on home 02 but then this was discontinued.   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 Decreased to 2.5 yesterday and now back up to 2.84. Reported baseline 2.5  Anemia:Hgb 8.6>>7.7>7.8. Baseline 8-9. MCV 74%. Denies hematochezia or melena. Transfusion ordered per admitting team  Signed, Reino Bellis, NP-C Pager [phone]

## 2015-06-06 NOTE — Procedures (Signed)
Risk and benefit of the procedure was discussed. Patient was given informed consent, signed copy placed in the chart. Appropriate time out was taken. Area cleaned with iodine solution. One milliliter of methylprednisolone 80 mg/ml was injected into the joint space from medial aspect. The patient tolerated the procedure well. There were no complications.  Medication: Depo-Medrol Lot #: [id number] Exp Date: 10/2015

## 2015-06-06 NOTE — Discharge Instructions (Addendum)
It has been a pleasure taking care of you! You were admitted due to shortness of breath. This is due to excess fluid in your lung from your heart failure. We have treated your with fluid medication to remove the excess fluid. With that your breathing and leg swelling improved. We are discharging you on more of your fluid pill (Lasix) and your other home medications that you need to continue taking after you leave the hospital. There could be some changes made to your home medications during this hospitalization. Please, make sure to read the directions before you take them. The names and directions on how to take these medications are found on this discharge paper under medication section.  You will have a follow up with cardiology, heart doctors. You should hear from them by next week. If you don't get a call, please give them a call them on [phone].   Please follow up at your primary care doctor's office. The address, date and time are found on the discharge paper under follow up section.  Take care,

## 2015-06-06 NOTE — Progress Notes (Signed)
Family Medicine Teaching Service Daily Progress Note Intern Pager: [phone]  [name] Medical record number: [id number] Date of birth: 1950-10-27 Age: 66 y.o. Gender: male  Primary Care Provider: Clearance Coots, MD Consultants: cardiology Code Status: full  Pt Overview and Major Events to Date:  Leonard [name] is a 65 y.o. male presenting with chest pain and dyspnea . PMH is significant for HFpEF, CAD s/p 3 vessel CABG in 04/2013, PAD, HTN, HLD, and tobacco abuse (quit 2 year ago).  Dyspnea likely 2/2 CHF exacerbation: improved. On room air. Patient without crackles. Edema improved.  Has been on room air since yesterday. Echo with EF of 60-65% G2DD no other significant abnormalities, - S/p IV lasix 40 mg in ED -Appreciate card recs;  - switch to PO lasix twice a day  - I &O: UOP 2.1L/-1.1L/-5L - Daily Weight: 249>249>239>236. Dy weight ~232 lbs - continue cardiac monitor  Atypical Chest pain: serial troponin negative. Initial EKG with some TW and ST changes likely demand from CHF. TSH normal. Hb A1c. 12 in January 2017. Reports belching on arrival - Appreciate card recs  -Leonard [name] scan as outpatient per Dr. Claiborne [name] - Continue home lipitor& aspirin - Nitroglycerin 0.4mg  SL q22min prn chest pain  - protonix for 40 mg daily   CAD status post triple bypass in March 2015/PAD/HLD: had lipid panel in March 2017. He is on carvedilol and Lipitor and aspirin at home -will continue home meds  Accelerated hypertension: 160's/60's; 220/110 on arrival to ED. No sign of end organ damage. -we will continue to monitor -Appreciate card recs  -Increase hydralazine to 50 mg three times a day  -continue amlodipine, clonidine & CAD medications as above  Diabetes-2: A1c 10.3; 12 in 02/2015. On Lantus 65 units twice a day and NovoLog 35 units 3 times a day with meals at home. Fasting CBG 178 this morning -we'll continue Lantus 65 units daily -SSI resistant -CBG qACHS  Right knee  pain: report having this for two days. No history of trauma. Tender to palpation over joint lines. Mild swelling. Limited ROM due to pain.  -Consider tapping and sending culture  COPD: in his problem list. However, he is not on any breathing treatments. He had significant smoking history. His lung exam is negative for wheezing. -We'll monitor respiratory status -Can't start albuterol as needed  CKD-4: creatinine 2.5>2.77>2.84, 2.72 on admission. Baseline ~2.5 -will monitor BMP -Changed IV lasix to PO  Anemia: Hgb 8.6>>7.7>7.8. Baseline 8-9. MCV 74%. He reports belching. Denies hematochezia or melena. Anemia panel suggestive for IDA. Received iron transfusion. - Transfuse a unit of blood - Post transfusion H&H  FEN/GI:  -heart healthy/carb modified  Prophylaxis: heparin sq given his creatinine  Disposition: continue regular floor with tele  Subjective:  No acute event overngiht. He denies chest pain or shortness of breath.  Objective: Temp:  [98 F (36.7 C)-98.4 F (36.9 C)] 98 F (36.7 C) (05/03 0549) Pulse Rate:  [66-72] 66 (05/03 0549) Resp:  [16-18] 18 (05/03 0549) BP: (151-170)/(67-72) 166/67 mmHg (05/03 0549) SpO2:  [93 %-96 %] 93 % (05/03 0549) Weight:  [236 lb 3.2 oz (107.14 kg)] 236 lb 3.2 oz (107.14 kg) (05/03 0549) Physical Exam: GEN: lying in bed, appears obese CVS: RRR, normal s1 and s2, 2/6 SEM, trace edema RESP: on room air, good air movement bilaterally, no crackles, no wheeze GI: soft, non-tender, gaseous distention, +BS MSK: right knee with some swelling, no erythema, TTP over knee joints, limited ROM due  to pain. NEURO: A&O x3, no gross defecits  PSYCH: appropriate mood and affect   Laboratory:  Recent Labs Lab 06/04/15 0421 06/05/15 0411 06/06/15 0236  WBC 6.8 7.2 7.7  HGB 8.0* 7.7* 7.8*  HCT 26.7* 25.4* 25.1*  PLT 177 165 159    Recent Labs Lab 06/04/15 0421 06/05/15 0411 06/06/15 0236  NA 141 139 140  K 3.8 4.1 3.6  CL 110 107  107  CO2 21* 22 22  BUN 44* 46* 46*  CREATININE 2.50* 2.77* 2.84*  CALCIUM 8.6* 8.6* 8.6*  GLUCOSE 242* 225* 196*    Imaging/Diagnostic Tests: Dg Knee Complete 4 Views Right  06/05/2015  CLINICAL DATA:  Right knee pain.  No known injury. EXAM: RIGHT KNEE - COMPLETE 4+ VIEW COMPARISON:  11/25/2014 FINDINGS: No fracture or dislocation. 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 There is minimal spurring of the tibial spines. No evidence of chondrocalcinosis. Query small ankle joint effusion. Extensive adjacent vascular calcifications. No radiopaque foreign body. IMPRESSION: 1. Small joint effusion.  Otherwise, no acute findings. 2. 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 3. Extensive adjacent vascular calcifications. Correlation for symptoms of PAD is recommended. Electronically Signed   By: Leonard [name] M.D.   On: 06/05/2015 16:31    Leonard Riding, MD 06/06/2015, 7:04 AM PGY-1, [place] Intern pager: [phone], text pages welcome

## 2015-06-07 ENCOUNTER — Ambulatory Visit: Payer: Medicaid Other

## 2015-06-07 ENCOUNTER — Other Ambulatory Visit: Payer: Medicaid Other

## 2015-06-07 DIAGNOSIS — M25569 Pain in unspecified knee: Secondary | ICD-10-CM | POA: Insufficient documentation

## 2015-06-07 DIAGNOSIS — M25561 Pain in right knee: Secondary | ICD-10-CM

## 2015-06-07 LAB — CBC
HEMATOCRIT: 28.8 % — AB (ref 39.0–52.0)
HEMOGLOBIN: 8.9 g/dL — AB (ref 13.0–17.0)
MCH: 22.8 pg — AB (ref 26.0–34.0)
MCHC: 30.9 g/dL (ref 30.0–36.0)
MCV: 73.8 fL — AB (ref 78.0–100.0)
PLATELETS: 169 10*3/uL (ref 150–400)
RBC: 3.9 MIL/uL — AB (ref 4.22–5.81)
RDW: 18.9 % — ABNORMAL HIGH (ref 11.5–15.5)
WBC: 9.2 10*3/uL (ref 4.0–10.5)

## 2015-06-07 LAB — TYPE AND SCREEN
ABO/RH(D): B NEG
Antibody Screen: NEGATIVE
UNIT DIVISION: 0

## 2015-06-07 LAB — GLUCOSE, CAPILLARY
Glucose-Capillary: 278 mg/dL — ABNORMAL HIGH (ref 65–99)
Glucose-Capillary: 336 mg/dL — ABNORMAL HIGH (ref 65–99)

## 2015-06-07 LAB — BASIC METABOLIC PANEL
Anion gap: 13 (ref 5–15)
BUN: 50 mg/dL — ABNORMAL HIGH (ref 6–20)
CHLORIDE: 105 mmol/L (ref 101–111)
CO2: 20 mmol/L — AB (ref 22–32)
Calcium: 8.7 mg/dL — ABNORMAL LOW (ref 8.9–10.3)
Creatinine, Ser: 2.77 mg/dL — ABNORMAL HIGH (ref 0.61–1.24)
GFR calc non Af Amer: 23 mL/min — ABNORMAL LOW (ref >60)
GFR, EST AFRICAN AMERICAN: 26 mL/min — AB (ref >60)
Glucose, Bld: 299 mg/dL — ABNORMAL HIGH (ref 65–99)
POTASSIUM: 4.3 mmol/L (ref 3.5–5.1)
SODIUM: 138 mmol/L (ref 135–145)

## 2015-06-07 MED ORDER — POLYETHYLENE GLYCOL 3350 17 G PO PACK
17.0000 g | PACK | Freq: Every day | ORAL | Status: DC
  Administered 2015-06-07: 17 g via ORAL
  Filled 2015-06-07: qty 1

## 2015-06-07 MED ORDER — BISACODYL 5 MG PO TBEC: 10.0000 mg | DELAYED_RELEASE_TABLET | Freq: Every day | ORAL | Status: DC | PRN

## 2015-06-07 MED ORDER — HYDRALAZINE HCL 50 MG PO TABS: 50.0000 mg | ORAL_TABLET | Freq: Three times a day (TID) | ORAL | Status: DC

## 2015-06-07 NOTE — Progress Notes (Signed)
Jermaine with [facility] called for rolling walker; Noted [place] orders for HHPT/ OT; Patient has Medicaid insurance, per Medicaid benefit plan, they will only approve certain fractures or CVA or home health therapy; At this time patient does not qualify for HHPT/OT. Mindi Slicker [facility] [phone]

## 2015-06-07 NOTE — Evaluation (Signed)
Physical Therapy Evaluation Patient Details Name: Leonard [name] MRN: [id number] DOB: Aug 23, 1950 Today's Date: 06/07/2015   History of Present Illness  Leonard [name] is a 65 y.o. male presenting with chest pain and dyspnea   Clinical Impression  Patient evaluated by Physical Therapy with no further acute PT needs identified. All education has been completed and the patient has no further questions. Patient is new to using RW and could benefit from HHPT for home safety evaluation, however per Case Manager pt does not qualify for HHPT. See below for any follow-up Physical Therapy or equipment needs. PT is signing off. Thank you for this referral.     Follow Up Recommendations Home health PT (noted per CM pt does not qualify for HHPT)    Equipment Recommendations  Rolling walker with 5" wheels    Recommendations for Other Services       Precautions / Restrictions Precautions Precautions: Fall Precaution Comments: denies h/o recent falls       Mobility  Bed Mobility            General bed mobility comments: up in chair  Transfers Overall transfer level: Needs assistance Equipment used: Rolling walker (2 wheeled) Transfers: Sit to/from Stand Sit to Stand: Supervision Stand pivot transfers: Min assist       General transfer comment: educated on use of RW; repeated x 4 with pt ultimately able to recall proper sequence  Ambulation/Gait Ambulation/Gait assistance: Min guard;Supervision Ambulation Distance (Feet): 90 Feet Assistive device: Rolling walker (2 wheeled) Gait Pattern/deviations: Step-through pattern;Trunk flexed   Gait velocity interpretation: at or above normal speed for age/gender General Gait Details: educated on safe use of RW, including thru tight/narrow spaces, turns, approach to sink/counter  Science writer    Modified Rankin (Stroke Patients Only)       Balance Overall balance assessment: Needs assistance          Standing balance support: No upper extremity supported Standing balance-Leahy Scale: Fair Standing balance comment: >fair not tested; bil foot edema impairing balance                             Pertinent Vitals/Pain Pain Assessment: No/denies pain    Home Living Family/patient expects to be discharged to:: Private residence Living Arrangements: Other relatives (sister) Available Help at Discharge: Family;Available 24 hours/day (sister) Type of Home: Apartment Home Access: Level entry     Home Layout: One level Home Equipment: Shower seat      Prior Function Level of Independence: Independent;Needs assistance      ADL's / Homemaking Assistance Needed: has A 3 times a week for shower        Hand Dominance   Dominant Hand: Right    Extremity/Trunk Assessment   Upper Extremity Assessment: Defer to OT evaluation;Generalized weakness           Lower Extremity Assessment: RLE deficits/detail;LLE deficits/detail RLE Deficits / Details: edema bil  LLE Deficits / Details: edema  Cervical / Trunk Assessment: Normal  Communication   Communication: No difficulties  Cognition Arousal/Alertness: Awake/alert Behavior During Therapy: WFL for tasks assessed/performed Overall Cognitive Status: Within Functional Limits for tasks assessed                      General Comments General comments (skin integrity, edema, etc.): Educated on positioning to decrease LE edema  Exercises General Exercises - Lower Extremity Ankle Circles/Pumps: AROM;Both;10 reps      Assessment/Plan    PT Assessment All further PT needs can be met in the next venue of care  PT Diagnosis Difficulty walking   PT Problem List Decreased strength;Decreased balance;Decreased mobility;Decreased knowledge of use of DME  PT Treatment Interventions     PT Goals (Current goals can be found in the Care Plan section) Acute Rehab PT Goals Patient Stated Goal: get home and  follow my sports PT Goal Formulation: All assessment and education complete, DC therapy    Frequency     Barriers to discharge        Co-evaluation               End of Session   Activity Tolerance: Patient tolerated treatment well Patient left: in chair;with call bell/phone within reach           Time: 1346-1400 PT Time Calculation (min) (ACUTE ONLY): 14 min   Charges:   PT Evaluation $PT Eval Low Complexity: 1 Procedure     PT G Codes:        Leonard [name] 06/24/15, 2:09 PM Pager [phone]

## 2015-06-07 NOTE — Progress Notes (Signed)
Pt ambulated in hallway on room air, o2 sats 95%.  Pt says he just needs a walker at home to walk with.  Will continue to monitor.

## 2015-06-07 NOTE — Evaluation (Signed)
Occupational Therapy Evaluation Patient Details Name: Leonard [name] MRN: [id number] DOB: [birth year] Today's Date: 06/07/2015    History of Present Illness Leonard [name] is a 65 y.o. male presenting with chest pain and dyspnea    Clinical Impression   Pt admitted with CP and dyspnea. Pt currently with functional limitations due to the deficits listed below (see OT Problem List).  Pt will benefit from skilled OT to increase their safety and independence with ADL and functional mobility for ADL to facilitate discharge to venue listed below.      Follow Up Recommendations  Home health OT    Equipment Recommendations   (rolling walker)       Precautions / Restrictions Precautions Precautions: Fall      Mobility Bed Mobility Overal bed mobility: Needs Assistance Bed Mobility: Supine to Sit     Supine to sit: Supervision        Transfers Overall transfer level: Needs assistance Equipment used: Rolling walker (2 wheeled) Transfers: Sit to/from [facility] Sit to Stand: Min assist Stand pivot transfers: Min assist                 ADL Overall ADL's : Needs assistance/impaired                         Toilet Transfer: Minimal assistance;RW;Comfort height toilet;Ambulation   Toileting- Clothing Manipulation and Hygiene: Sit to/from stand;Cueing for sequencing;Cueing for safety;Minimal assistance       Functional mobility during ADLs: Minimal assistance General ADL Comments: pt has aide 3 times a week for shower. sister does most of home management.               Pertinent Vitals/Pain Pain Assessment: No/denies pain     Hand Dominance Right   Extremity/Trunk Assessment Upper Extremity Assessment Upper Extremity Assessment: Generalized weakness           Communication Communication Communication: No difficulties   Cognition Arousal/Alertness: Awake/alert Behavior During Therapy: WFL for tasks  assessed/performed Overall Cognitive Status: Within Functional Limits for tasks assessed                                Home Living Family/patient expects to be discharged to:: Private residence Living Arrangements: Other (Comment) Available Help at Discharge: Family Type of Home: Apartment Home Access: Level entry     Home Layout: One level     Bathroom Shower/Tub: Teacher, early years/pre: Handicapped height     Home Equipment: Shower seat          Prior Functioning/Environment Level of Independence: Independent;Needs assistance    ADL's / Homemaking Assistance Needed: has A 3 times a week for shower        OT Diagnosis: Generalized weakness   OT Problem List: Decreased strength;Impaired balance (sitting and/or standing)   OT Treatment/Interventions: Self-care/ADL training;DME and/or AE instruction;Patient/family education    OT Goals(Current goals can be found in the care plan section) Acute Rehab OT Goals Patient Stated Goal: get home and follow my sports OT Goal Formulation: With patient Time For Goal Achievement: 06/21/15 Potential to Achieve Goals: Good  OT Frequency: Min 2X/week              End of Session Equipment Utilized During Treatment: Rolling walker Nurse Communication: Mobility status  Activity Tolerance: Patient tolerated treatment well Patient left: in chair;with call bell/phone within reach  Time: 1020-1049 OT Time Calculation (min): 29 min Charges:  OT General Charges $OT Visit: 1 Procedure OT Evaluation $OT Eval Moderate Complexity: 1 Procedure OT Treatments $Self Care/Home Management : 8-22 mins G-Codes:    Payton Mccallum D 2015-07-02, 10:59 AM

## 2015-06-07 NOTE — Progress Notes (Signed)
[name] Date of Encounter: 06/07/2015   Principal Problem:   Acute CHF (congestive heart failure) ([facility]) Active Problems:   DM (diabetes mellitus), type 2 with renal complications (HCC)   Essential hypertension, benign   Chest pain   Renal failure   Knee pain    SUBJECTIVE  Feeling well today. No complaints of chest pain/dyspnea.   CURRENT MEDS . sodium chloride   Intravenous Once  . amLODipine  10 mg Oral Daily  . antiseptic oral rinse  7 mL Mouth Rinse BID  . aspirin EC  81 mg Oral Daily  . atorvastatin  40 mg Oral q1800  . carvedilol  50 mg Oral BID WC  . cloNIDine  0.3 mg Oral TID  . furosemide  60 mg Oral BID  . heparin  5,000 Units Subcutaneous Q8H  . hydrALAZINE  50 mg Oral Q8H  . insulin aspart  0-20 Units Subcutaneous TID WC  . insulin aspart  0-5 Units Subcutaneous QHS  . insulin glargine  45 Units Subcutaneous BID  . pantoprazole  40 mg Oral Daily  . polyethylene glycol  17 g Oral Daily  . prednisoLONE acetate  1 drop Right Eye QID    OBJECTIVE  Filed Vitals:   06/06/15 1448 06/06/15 1649 06/07/15 0502 06/07/15 1200  BP: 156/65 168/72 156/62 178/72  Pulse: 64 65 65 58  Temp: 97.9 F (36.6 C) 98.1 F (36.7 C) 98.2 F (36.8 C) 97.6 F (36.4 C)  TempSrc: Oral Oral Oral Oral  Resp: _0 Height:      Weight:   236 lb 12.8 oz (107.412 kg)   SpO2: 95% 92% 95% 99%    Intake/Output Summary (Last 24 hours) at 06/07/15 1307 Last data filed at 06/07/15 0943  Gross per 24 hour  Intake   1280 ml  Output   2900 ml  Net  -1620 ml   Filed Weights   06/05/15 0526 06/06/15 0549 06/07/15 0502  Weight: 239 lb 4.8 oz (108.546 kg) 236 lb 3.2 oz (107.14 kg) 236 lb 12.8 oz (107.412 kg)    PHYSICAL EXAM  General: Pleasant, NAD. Neuro: Alert and oriented X 3. Moves all extremities spontaneously. Psych: Normal affect. HEENT:  Normal  Neck: Supple without bruits or JVD. Lungs:  Resp regular and unlabored, CTA. Heart: RRR no s3,  s4, or murmurs. Abdomen: Soft, non-tender, mildly distended, BS + x 4.  Extremities: No clubbing, cyanosis or 1+ bilateral edema but much improved. DP/PT/Radials 2+ and equal bilaterally.  Accessory Clinical Findings  CBC  Recent Labs  06/06/15 0236 06/07/15 0337  WBC 7.7 9.2  HGB 7.8* 8.9*  HCT 25.1* 28.8*  MCV 72.8* 73.8*  PLT 159 130   Basic Metabolic Panel  Recent Labs  06/06/15 0236 06/07/15 0337  NA 140 138  K 3.6 4.3  CL 107 105  CO2 22 20*  GLUCOSE 196* 299*  BUN 46* 50*  CREATININE 2.84* 2.77*  CALCIUM 8.6* 8.7*   BNP (last 3 results)  Recent Labs  06/03/15 0225  BNP 489.2*   Hepatic Function Latest Ref Rng 01/15/2015 02/10/2014 12/09/2013  Total Protein 6.4 - 8.3 g/dL 7.3 6.5 7.1  Albumin 3.5 - 5.0 g/dL 4.3 3.6 3.7  AST 5 - 34 U/L _1 ALT 0 - 55 U/L _2 Alk Phosphatase 40 - 150 U/L 83 73 74  Total Bilirubin 0.20 - 1.20 mg/dL 0.35 0.5 0.4    TELE  NSR  Radiology/Studies  Dg Knee Complete 4 Views Right  06/05/2015  CLINICAL DATA:  Right knee pain.  No known injury. EXAM: RIGHT KNEE - COMPLETE 4+ VIEW COMPARISON:  11/25/2014 FINDINGS: No fracture or dislocation. 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 There is minimal spurring of the tibial spines. No evidence of chondrocalcinosis. Query small ankle joint effusion. Extensive adjacent vascular calcifications. No radiopaque foreign body. IMPRESSION: 1. Small joint effusion.  Otherwise, no acute findings. 2. 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 3. Extensive adjacent vascular calcifications. Correlation for symptoms of PAD is recommended. Electronically Signed   By: Sandi Mariscal M.D.   On: 06/05/2015 16:31    ASSESSMENT AND PLAN  Leonard [name] is a 65 y.o. male with a history of CAD s/p CABG x3V (04/2013), PAD s/p R femoral artery stenting (2014), DM, HTN,  chronic diastolic CHF, CKD, COPD on home 02, chronic anemia, and previous heavy tobacco abuse who presented to [facility] on 06/03/15 with chest pain and SOB.   Acute on chronic diastolic CHF: Patient presented with s/s consistent with volume overload initially requiring BIPAP therapy. CXR with CHF and BNP elevated ~500.  -- Echo in 12/2014 with EF of 55-60%, no structural or motion abnormalities. Repeat 2D ECHO showed normal EF of 60-65%, with G2DD with PA peak pressure of 78m Hg. -- Net neg 4.6L. Weight down 13 lbs (249-->236 lbs). Dry weight is 232 lbs. Creat initially improved with diuresis (2.72--> 2.5) but increased back to 2.84. Decreased lasix to 638mPO BID yesterday with slightly improved Cr 2.77.  --Has diuresed well, and appears to be close to euvolemic state.   Atypical Chest pain: troponin 0.04--> 0.03--> 0.03. ECG with no acute ST or TW changes. Some non specific ST/TW changes in lateral leads similar to previous. Likely related to chronic anemia and acute CHF. -will follow Dr. KeEvette Georgesecommendation regarding outpatient stress test, and arrange f/u appt.   Accelerated hypertension: BP 220/110 on arrival to ED. Started on nitro drip and IV Lasix. BP was not well controlled on amlodipine 1030mclonidine 0.3 TID, Coreg 36m32mD. Hydralazine increased to 36mg47m with initally improvement, but readings are higher today.Would change to 75mg 36mfor better compliance.   CAD s/p CABG: Continue ASA, BB and statin.   Diabetes-2: A1c 12 in 02/2015. Management per IM.  COPD: he quit smoking 2 years ago. Was on home 02 but then this was discontinued.   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 Lasix dose decreased yesterday with slight Cr improvement. Reported baseline 2.5  Anemia:Hgb 8.6>>7.7>7.8. Baseline 8-9. MCV 74%. Denies hematochezia or melena. Transfusion yesterday. Hgb improved to 8.9  Signed, LindsaReino Bellis Pager [phone] seen and examined. Agree with assessment and  plan. No chest pain; feels better. BP improved but still elevated. Will need close f/u of BP as outpatent and med titration if needed. Family medicine service to dc today.   ThomasTroy SineFACC 5[facility]017 5:10 PM

## 2015-06-07 NOTE — Progress Notes (Signed)
Pt refused bed alarm on. 

## 2015-06-07 NOTE — Progress Notes (Signed)
Family Medicine Teaching Service Daily Progress Note Intern Pager: [phone]  [name] Medical record number: [id number] Date of birth: 1950-11-10 Age: 65 y.o. Gender: male  Primary Care Provider: Clearance Coots, MD Consultants: cardiology Code Status: full  Pt Overview and Major Events to Date:  Leonard [name] is a 65 y.o. male presenting with chest pain and dyspnea . PMH is significant for HFpEF, CAD s/p 3 vessel CABG in 04/2013, PAD, HTN, HLD, and tobacco abuse (quit 2 year ago).  Dyspnea likely 2/2 CHF exacerbation: improved. Ambulating in the room on room air this morning. Exam without crackles. Edema improved. Echo with EF of 60-65% G2DD no other significant abnormalities - S/p IV lasix 40 mg in ED -Appreciate card recs: - PO lasix 60 mg twice a day - I &O: UOP 2.3L/-1.4L/-6.5L - Daily Weight: 249>249>239>236>236. Dy weight ~232 lbs - continue cardiac monitor  Atypical Chest pain: serial troponin negative. Initial EKG with some TW and ST changes likely demand from CHF. TSH normal. Reports belching on arrival - Appreciate card recs -Lexa scan as outpatient per Dr. Claiborne Billings - Continue home lipitor& aspirin - Nitroglycerin 0.4mg  SL q42min prn chest pain  - protonix for 40 mg daily   CAD status post triple bypass in March 2015/PAD/HLD: had lipid panel in March 2017. He is on carvedilol and Lipitor and aspirin at home -will continue home meds  Accelerated hypertension: 150's/60's; 220/110 on arrival to ED. No sign of end organ damage. -we will continue to monitor -Appreciate card recs -Hydralazine to 50 mg three times a day  -Continue amlodipine, clonidine & CAD medications as above -Discontinued HCTZ given risk for gout and AKI with long use of Lasix -Split sleep study with AHI of 2.2%. However, some desaturations to 72% during studies.   Diabetes-2: A1c 10.3; 12 in 02/2015. On Lantus 65 units twice a day and NovoLog 35  units 3 times a day with meals at home. Fasting CBG 178 this morning -on Lantus 45 units daily -SSI resistant -CBG qACHS  COPD: in his problem list. However, he is not on any breathing treatments. Had PFT in 05/2013 suggestive for COPD with FEV1 of 47%. He had over 60 pack-year smoking history. His lung exam is negative for wheezing. -Consider staring controller medis -Albuterol as needed   Right knee pain:improved. Up walking in the room.  -s/p steroid injection on 5/3  Constipation: no BM since the day of admission even after miralax two days ago -Miralax and dulcolax this morning.  CKD-4: creatinine 2.5>2.77>2.84>2.77; 2.72 on admission. Baseline ~2.5 -will monitor BMP - recommend nephrology work up as outpatient  Anemia: Hgb 8.6>>7.7>7.8>1u>9. Baseline 8-9. MCV 74%. He reports belching. Denies hematochezia or melena. Anemia panel suggestive for IDA. S/p feraheme and 1u of pRBC -Recommend colorectal screening as outpatient  FEN/GI:  -heart healthy/carb modified -Miralax and dulcolax -Protonix   Prophylaxis: heparin sq given his creatinine  Disposition: discharge home today  Subjective:  No event overnight. Had steroid injection yesterday. Knee pain resolved except for some stiffness. He is up ambulating in the room on room air. Nasal stitches removed yesterday  Objective: Temp:  [97.9 F (36.6 C)-98.3 F (36.8 C)] 98.2 F (36.8 C) (05/04 0502) Pulse Rate:  [63-65] 65 (05/04 0502) Resp:  [17-20] 17 (05/04 0502) BP: (155-168)/(62-72) 156/62 mmHg (05/04 0502) SpO2:  [92 %-98 %] 95 % (05/04 0502) Weight:  [236 lb 12.8 oz (107.412 kg)] 236 lb 12.8 oz (107.412 kg) (05/04 0502) Physical Exam: GEN:up ambulating  in the room  CVS: RRR, normal s1 and s2, 1/6 SEM, trace edema RESP: on room air, good air movement bilaterally, no crackles, no wheeze GI: soft, non-tender, gaseous distention, +BS MSK: right knee swelling improved. No tenderness to palpation. Up ambulating in the  room NEURO: A&O x3, no gross defecits  PSYCH: appropriate mood and affect  Laboratory:  Recent Labs Lab 06/05/15 0411 06/06/15 0236 06/07/15 0337  WBC 7.2 7.7 9.2  HGB 7.7* 7.8* 8.9*  HCT 25.4* 25.1* 28.8*  PLT 165 159 169    Recent Labs Lab 06/05/15 0411 06/06/15 0236 06/07/15 0337  NA 139 140 138  K 4.1 3.6 4.3  CL 107 107 105  CO2 22 22 20*  BUN 46* 46* 50*  CREATININE 2.77* 2.84* 2.77*  CALCIUM 8.6* 8.6* 8.7*  GLUCOSE 225* 196* 299*   Hgb 8.9 Cr 2.77  Imaging/Diagnostic Tests:  Mercy Riding, MD 06/07/2015, 7:11 AM PGY-1, [place] Intern pager: [phone], text pages welcome

## 2015-06-07 NOTE — Discharge Summary (Signed)
[facility] Discharge Summary  [name] Medical record number: [id number] Date of birth: 1950-09-20 Age: 65 y.o. Gender: male Date of Admission: 06/03/2015  Date of Discharge: 06/07/2015  Admitting Physician: Alveda Reasons, MD  Primary Care Provider: Clearance Coots, MD Consultants: cardiology  Indication for Hospitalization: dyspnea and respiratory failure  Discharge Diagnoses/Problem List:  HFpEF CAD s/p CABG PAD Hypertension DM-2 CKD-4 Anemia OA COPD MGUS  Disposition: home with home PT/OT  Discharge Condition: improved and stable  Discharge Exam:    Brief Hospital Course:  Leonard [name] is a 65 y.o. male presenting with chest pain and dyspnea . PMH is significant for HFpEF, CAD s/p 3 vessel CABG in 04/2013, PAD, HTN, HLD, and tobacco abuse (quit 2 year ago).  Dyspnea likely 2/2 CHF exacerbation: improved. Patient with LE edema, bibasilar crackles, elevated BNP to 500, pulmonary congestion on CXR requiring BIPAP on admission. Cardiology was consulted. He was diuresed well with IV lasix initially 60 mg three times a day, then 80 mg twice a day, then oral lasix 80 mg twice a day. Edema improved. Dyspnea resolved. Weight down from 249lbs on admission to 236 lbs at discharge. Dry weight seems to be 232 lbs. Echo was obtained and showed EF of 60-65% G2DD no other significant abnormalities.  On the day of discharge, patient was ambulated on room air and maintained saturation over 95%. He was evaluated by OT who recommended home OT and rolling walker for weakness and balance. This were ordered. He was discharged on lasix 60 mg twice a day and his home coreg.  Atypical Chest pain: serial troponin negative. Initial EKG with some TW and ST changes likely demand from CHF and hypertension vs. ACS. TSH normal. Cardiology consulted and will arrange outpatient Lexa scan (Dr. Claiborne Billings).   Accelerated hypertension:BP 220/110 on arrival to  ED. No sign of end organ damage. Started on hydralazine 25 mg three times a day, which was increased to 50 mg three times a day. BP in 150's/60's at time of discharge. HCTZ was discontinued due to risk for gout and AKI along with Lasix. It was noted that cardiology saw the patient after his discharge order was signed and recommended increasing his hydralazine to 75 mg three times a day.   COPD: listed in his problem list. However, he is not on any breathing treatments. Had PFT in 05/2013 suggestive for COPD with FEV1 of 47% (GOLD-3). He had over 60 pack-year smoking history. His lung exam is negative for wheezing this hospitalization. We recommend staring controller and rescue medications at follow up.  Right knee pain:improved. He noted this during this hospitalization. No history of trauma. Tender to palpation over joint lines. Mild swelling but no sign of infection. Limited ROM due to pain. X-ray with small joint effusion, mild tricompartmental degenerative change of the knee and extensive adjacent vascular calcifications but no acute findings. He was injected methylprednisolone 80 mg injection on 5/3 with subsequent improvement in pain and swelling. The following day, patient was up ambulating in the room without pain.  CKD-4: creatinine 2.5>2.77>2.84>2.77; 2.72 on admission. Baseline ~2.5. Recommend nephrology work up as outpatient  Anemia: Hgb 8.6>>7.7>7.8>1u>9. Baseline 8-9. MCV 74%. He reports belching. Denies hematochezia or melena. Anemia panel suggestive for IDA. Received feraheme infusion 510 mg and 1u of pRBC. He was discharged on Protonix.  We recommend colorectal screening as outpatient   Issues for Follow Up:  1. HFpEF: assess fluid status.  2. Chest pain: cardiology  to arrange outpatient LexiScan. Follow up on this 3. Hypertension: hydralazine added this hospitalization. HCTZ discontinued. Consider increasing hydralazine to 75 mg three times a day if not at goal. Avoid HCTZ as this  could increase risk of gout with chronic use of lasix.  4. COPD: had PFT suggestive of GOLD 3. Recommend controller and rescue meds 5. CKD-4: recommend check renal function panel and outpatient nephrology referral 6. IDA/ACD: repeat CBC. Recommend colorectal screening.   Significant Procedures: none  Significant Labs and Imaging:   Recent Labs Lab 06/05/15 0411 06/06/15 0236 06/07/15 0337  WBC 7.2 7.7 9.2  HGB 7.7* 7.8* 8.9*  HCT 25.4* 25.1* 28.8*  PLT 165 159 169    Recent Labs Lab 06/03/15 0225 06/03/15 0540 06/04/15 0421 06/05/15 0411 06/06/15 0236 06/07/15 0337  NA 140  --  141 139 140 138  K 4.1  --  3.8 4.1 3.6 4.3  CL 109  --  110 107 107 105  CO2 20*  --  21* 22 22 20*  GLUCOSE 256*  --  242* 225* 196* 299*  BUN 42*  --  44* 46* 46* 50*  CREATININE 2.62* 2.72* 2.50* 2.77* 2.84* 2.77*  CALCIUM 8.7*  --  8.6* 8.6* 8.6* 8.7*    Results/Tests Pending at Time of Discharge: none  Discharge Medications:    Medication List    STOP taking these medications        hydrochlorothiazide 25 MG tablet  Commonly known as:  HYDRODIURIL     PRESCRIPTION MEDICATION      TAKE these medications        acetaminophen 500 MG tablet  Commonly known as:  TYLENOL  Take 1 tablet (500 mg total) by mouth every 6 (six) hours as needed for mild pain or moderate pain.     amLODipine 10 MG tablet  Commonly known as:  NORVASC  Take 1 tablet (10 mg total) by mouth daily.     aspirin 81 MG EC tablet  Take 1 tablet (81 mg total) by mouth daily.     atorvastatin 40 MG tablet  Commonly known as:  LIPITOR  TAKE 1 TABLET BY MOUTH EVERY DAY AT 6 PM     carvedilol 25 MG tablet  Commonly known as:  COREG  Take 2 tablets (50 mg total) by mouth 2 (two) times daily with a meal.     cloNIDine 0.3 MG tablet  Commonly known as:  CATAPRES  Take 1 tablet (0.3 mg total) by mouth 3 (three) times daily.     furosemide 40 MG tablet  Commonly known as:  LASIX  Take 1.5 tablets (60 mg  total) by mouth 2 (two) times daily. 1 and 1/2 tablet daily     hydrALAZINE 50 MG tablet  Commonly known as:  APRESOLINE  Take 1 tablet (50 mg total) by mouth every 8 (eight) hours.     insulin aspart 100 UNIT/ML FlexPen  Commonly known as:  NOVOLOG FLEXPEN  Inject 35 Units into the skin 3 (three) times daily with meals.     Insulin Glargine 100 UNIT/ML Solostar Pen  Commonly known as:  LANTUS SOLOSTAR  Inject 65 Units into the skin 2 (two) times daily.     Insulin Pen Needle 31G X 8 MM Misc  Commonly known as:  B-D ULTRAFINE III SHORT PEN  Use with insulin pen twice daily     nitroGLYCERIN 0.4 MG SL tablet  Commonly known as:  NITROSTAT  Place 1 tablet (0.4 mg  total) under the tongue every 5 (five) minutes as needed for chest pain.     pantoprazole 40 MG tablet  Commonly known as:  PROTONIX  Take 1 tablet (40 mg total) by mouth daily.     polyethylene glycol packet  Commonly known as:  MIRALAX / GLYCOLAX  Take 17 g by mouth daily as needed for mild constipation or moderate constipation.        Discharge Instructions: Please refer to Patient Instructions section of EMR for full details.  Patient was counseled important signs and symptoms that should prompt return to medical care, changes in medications, dietary instructions, activity restrictions, and follow up appointments.   Follow-Up Appointments:     Follow-up Information    Follow up with Darci Needle, MD. Go on 06/12/2015.   Specialty:  Family Medicine   Why:  at 11 am for hospital follow up   Contact information:   [place] [address] [phone]       Mercy Riding, MD 06/07/2015, 1:13 PM PGY-1, [place]

## 2015-06-12 ENCOUNTER — Ambulatory Visit (INDEPENDENT_AMBULATORY_CARE_PROVIDER_SITE_OTHER): Payer: Medicaid Other | Admitting: Internal Medicine

## 2015-06-12 ENCOUNTER — Encounter: Payer: Self-pay | Admitting: Internal Medicine

## 2015-06-12 ENCOUNTER — Telehealth: Payer: Self-pay | Admitting: *Deleted

## 2015-06-12 VITALS — BP 165/59 | HR 63 | Temp 98.2°F | Ht 68.0 in | Wt 249.2 lb

## 2015-06-12 DIAGNOSIS — N184 Chronic kidney disease, stage 4 (severe): Secondary | ICD-10-CM | POA: Diagnosis not present

## 2015-06-12 DIAGNOSIS — I5032 Chronic diastolic (congestive) heart failure: Secondary | ICD-10-CM

## 2015-06-12 DIAGNOSIS — Z79899 Other long term (current) drug therapy: Secondary | ICD-10-CM

## 2015-06-12 DIAGNOSIS — J449 Chronic obstructive pulmonary disease, unspecified: Secondary | ICD-10-CM | POA: Diagnosis not present

## 2015-06-12 DIAGNOSIS — I10 Essential (primary) hypertension: Secondary | ICD-10-CM

## 2015-06-12 DIAGNOSIS — R079 Chest pain, unspecified: Secondary | ICD-10-CM

## 2015-06-12 MED ORDER — ALBUTEROL SULFATE HFA 108 (90 BASE) MCG/ACT IN AERS: 2.0000 | INHALATION_SPRAY | Freq: Four times a day (QID) | RESPIRATORY_TRACT | Status: DC | PRN

## 2015-06-12 MED ORDER — FLUTICASONE-SALMETEROL 250-50 MCG/DOSE IN AEPB: 1.0000 | INHALATION_SPRAY | Freq: Two times a day (BID) | RESPIRATORY_TRACT | Status: DC

## 2015-06-12 NOTE — Progress Notes (Signed)
Subjective:    Patient ID: Leonard [name], male    DOB: 07/22/1950, 65 y.o.   MRN: [id number]  HPI  Leonard [name] is a 65-y/o male with HFpEF (G2DD), CAD s/p CABG, COPD, T2DM, HTN, renal failure, and MGUS who presents for hospital follow-up for dyspnea likely 2/2 CHF exacerbation. He expresses concerns about not understanding his medication changes made during hospitalization but otherwise denies increased SOB but has noticed increased swelling of his legs since hospital discharge. He had not yet started hydralazine as recommended at discharge but had discontinued HCTZ as instructed. He lives with his older sister, but he says he usually is able to manage his own medications. He was hoping for Dr. Valentina Lucks to prepare his pillbox today, but he is not in the office.   CHF: - Patient notes he knows his weight is up.  - Has been taking 60 mg lasix BID and this was noted in his pillbox, which he brought to this appointment. - Denies SOB.  HTN: - Not taking hydralazine 50 mg TID that was prescribed at discharge due to confusion - Cardiology recommended hydralazine 75 mg after patient was discharged - Did stop taking HCTZ as instructed  Atypical chest pain: - Underwent work-up during hospitalization with negative troponins. - Reports chest pain yesterday 06/11/15 that resolved with nitroglycerin  COPD: - PFTs in 2015, showing FEV1 of 47% (GOLD-3) but not on any medication - No wheezing noted during recent hospitalization  CKD-4: - Following with nephrologist Dr. [name] - SCr slightly elevated at 2.77 on 06/07/15 from baseline ~ 2.5  Review of Systems  Respiratory: Negative for shortness of breath.   Cardiovascular: Positive for leg swelling.   Social: Former Smoker    Objective: Blood pressure 165/59, pulse 63, temperature 98.2 F (36.8 C), temperature source Oral, height 5\' 8"  (1.727 m), weight 249 lb 3.2 oz (113.036 kg).   Physical Exam  Constitutional: He appears  well-developed and well-nourished. No distress.  Cardiovascular: Normal rate, regular rhythm and normal heart sounds.   No murmur heard. Pulmonary/Chest: Effort normal. No respiratory distress. He has no wheezes. He has rales (bibasilar crackles).  Abdominal: Soft. Bowel sounds are normal. There is no tenderness. There is no rebound and no guarding.  Musculoskeletal: He exhibits edema (1+ pitting edema to knee).  Skin: Skin is warm and dry. No rash noted. No erythema.      Assessment & Plan:  Leonard [name] presents after hospitalization for CHF exacerbation with increase in weight of over 10 pounds since discharge and bibasilar crackles but no shortness of breath. Blood pressure not well controlled. He has not been taking prescribed hydralazine.   Patient to follow-up 06/19/15 with pharmacist Dr. Valentina Lucks, followed by appointment with his PCP to again review how he is taking medications and re-assess fluid status.  Essential hypertension, benign - Wrote out how patient is to be taking medications and disposed empty HCTZ bottles. - Advised patient to take hydralazine 50 mg TID. Will require close re-assessment. May require increase to 75 mg TID as cardiology suggested.  - Placed home healthcare order for nursing visit for medication teaching.   Chronic diastolic heart failure ([place]) - Reviewed medications. May require increase in lasix dose of 60 mg BID.  CKD (chronic kidney disease) stage 4, GFR 15-29 ml/min ([facility]) - Consider getting renal function panel at next visit once taking hydralazine.  Chest pain - Has follow-up cardiology appointment 06/28/15 with PA Tarri Fuller. Outpatient LexiScan recommended.  COPD (chronic obstructive  pulmonary disease) - Prescribed advair as controller medicine and albuterol for rescue. Advised patient to pick up inhalers but to wait until his follow-up visit with Dr. Valentina Lucks before starting treatment, as patient seemed overwhelmed with just his switch in blood  pressure medications.   Olene Floss, MD [facility], PGY-1

## 2015-06-12 NOTE — Telephone Encounter (Signed)
Prior Authorization received from [place] for Advair Diskus. Formulary and PA form placed in provider box for completion. Derl Barrow, RN

## 2015-06-12 NOTE — Patient Instructions (Signed)
Leonard [name],  Please make an appointment up front for next week to be seen by a doctor at a time when Dr. Valentina Lucks is also here.  I have prescribed two inhalers for you to pick up for COPD, but you may wait to start taking those until you speak with Dr. Valentina Lucks.  You will get a call about a home health agent to come to your house to review medicines in the next couple of days.  Below are the medicines you should be taking daily:  Blood pressure: - amlodipine 10 mg once daily - carvedilol 50 mg (2 tablets) twice daily with meals - clonidine 0.3 mg three times daily - furosemide 60 mg (1.5 tablets) twice daily - hydralazine 50 mg every 8 hours  Cholesterol: - atorvastatin 40 mg daily at 6 PM  Other (stroke prevention): - aspirin 81 mg daily  Reflux: - pantoprazole  Constipation: - miralax

## 2015-06-13 ENCOUNTER — Other Ambulatory Visit: Payer: Self-pay | Admitting: Family Medicine

## 2015-06-13 DIAGNOSIS — I10 Essential (primary) hypertension: Secondary | ICD-10-CM

## 2015-06-13 DIAGNOSIS — E1129 Type 2 diabetes mellitus with other diabetic kidney complication: Secondary | ICD-10-CM

## 2015-06-13 NOTE — Telephone Encounter (Signed)
Received PA approval for Adviar Diskus 250-50 via [employer].  Med approved for 06/13/15 - 06/12/16.  [facility] informed.  PA approval number [id number]. Derl Barrow, RN

## 2015-06-13 NOTE — Assessment & Plan Note (Signed)
-   Wrote out how patient is to be taking medications and disposed empty HCTZ bottles. - Advised patient to take hydralazine 50 mg TID. Will require close re-assessment. May require increase to 75 mg TID as cardiology suggested.  - Placed home healthcare order for nursing visit for medication teaching.

## 2015-06-13 NOTE — Telephone Encounter (Signed)
Gave form to [place].

## 2015-06-13 NOTE — Assessment & Plan Note (Signed)
-   Consider getting renal function panel at next visit once taking hydralazine.

## 2015-06-13 NOTE — Assessment & Plan Note (Signed)
-   Reviewed medications. May require increase in lasix dose of 60 mg BID.

## 2015-06-13 NOTE — Assessment & Plan Note (Signed)
-   Has follow-up cardiology appointment 06/28/15 with PA Tarri Fuller. Outpatient LexiScan recommended.

## 2015-06-13 NOTE — Assessment & Plan Note (Addendum)
-   Prescribed advair as controller medicine and albuterol for rescue. Advised patient to pick up inhalers but to wait until his follow-up visit with Dr. Valentina Lucks before starting treatment, as patient seemed overwhelmed with just his switch in blood pressure medications.

## 2015-06-13 NOTE — Assessment & Plan Note (Signed)
-   Patient to have outpatient LexiScan. He has appointment with PA Tarri Fuller

## 2015-06-18 ENCOUNTER — Inpatient Hospital Stay (HOSPITAL_COMMUNITY)
Admission: EM | Admit: 2015-06-18 | Discharge: 2015-06-22 | DRG: 304 | Disposition: A | Discharge status: Home / Self Care | Payer: Medicaid Other | Attending: Family Medicine | Admitting: Family Medicine

## 2015-06-18 ENCOUNTER — Encounter (HOSPITAL_COMMUNITY): Payer: Self-pay | Admitting: *Deleted

## 2015-06-18 ENCOUNTER — Emergency Department (HOSPITAL_COMMUNITY): Payer: Medicaid Other

## 2015-06-18 DIAGNOSIS — N179 Acute kidney failure, unspecified: Secondary | ICD-10-CM | POA: Diagnosis present

## 2015-06-18 DIAGNOSIS — Z951 Presence of aortocoronary bypass graft: Secondary | ICD-10-CM

## 2015-06-18 DIAGNOSIS — I10 Essential (primary) hypertension: Secondary | ICD-10-CM | POA: Diagnosis not present

## 2015-06-18 DIAGNOSIS — E1122 Type 2 diabetes mellitus with diabetic chronic kidney disease: Secondary | ICD-10-CM | POA: Diagnosis present

## 2015-06-18 DIAGNOSIS — Z8249 Family history of ischemic heart disease and other diseases of the circulatory system: Secondary | ICD-10-CM | POA: Diagnosis not present

## 2015-06-18 DIAGNOSIS — I158 Other secondary hypertension: Secondary | ICD-10-CM | POA: Diagnosis present

## 2015-06-18 DIAGNOSIS — I130 Hypertensive heart and chronic kidney disease with heart failure and stage 1 through stage 4 chronic kidney disease, or unspecified chronic kidney disease: Secondary | ICD-10-CM | POA: Diagnosis present

## 2015-06-18 DIAGNOSIS — D472 Monoclonal gammopathy: Secondary | ICD-10-CM | POA: Diagnosis present

## 2015-06-18 DIAGNOSIS — N184 Chronic kidney disease, stage 4 (severe): Secondary | ICD-10-CM | POA: Diagnosis present

## 2015-06-18 DIAGNOSIS — Z87891 Personal history of nicotine dependence: Secondary | ICD-10-CM | POA: Diagnosis not present

## 2015-06-18 DIAGNOSIS — R079 Chest pain, unspecified: Secondary | ICD-10-CM | POA: Insufficient documentation

## 2015-06-18 DIAGNOSIS — E781 Pure hyperglyceridemia: Secondary | ICD-10-CM | POA: Diagnosis present

## 2015-06-18 DIAGNOSIS — N189 Chronic kidney disease, unspecified: Secondary | ICD-10-CM

## 2015-06-18 DIAGNOSIS — Z801 Family history of malignant neoplasm of trachea, bronchus and lung: Secondary | ICD-10-CM

## 2015-06-18 DIAGNOSIS — I509 Heart failure, unspecified: Secondary | ICD-10-CM | POA: Insufficient documentation

## 2015-06-18 DIAGNOSIS — Z794 Long term (current) use of insulin: Secondary | ICD-10-CM

## 2015-06-18 DIAGNOSIS — D631 Anemia in chronic kidney disease: Secondary | ICD-10-CM | POA: Diagnosis present

## 2015-06-18 DIAGNOSIS — D509 Iron deficiency anemia, unspecified: Secondary | ICD-10-CM | POA: Diagnosis present

## 2015-06-18 DIAGNOSIS — I739 Peripheral vascular disease, unspecified: Secondary | ICD-10-CM | POA: Diagnosis present

## 2015-06-18 DIAGNOSIS — N390 Urinary tract infection, site not specified: Secondary | ICD-10-CM | POA: Diagnosis present

## 2015-06-18 DIAGNOSIS — I161 Hypertensive emergency: Principal | ICD-10-CM | POA: Diagnosis present

## 2015-06-18 DIAGNOSIS — I5033 Acute on chronic diastolic (congestive) heart failure: Secondary | ICD-10-CM | POA: Diagnosis present

## 2015-06-18 DIAGNOSIS — N183 Chronic kidney disease, stage 3 unspecified: Secondary | ICD-10-CM | POA: Insufficient documentation

## 2015-06-18 DIAGNOSIS — I252 Old myocardial infarction: Secondary | ICD-10-CM | POA: Diagnosis not present

## 2015-06-18 DIAGNOSIS — Z9119 Patient's noncompliance with other medical treatment and regimen: Secondary | ICD-10-CM

## 2015-06-18 DIAGNOSIS — R0989 Other specified symptoms and signs involving the circulatory and respiratory systems: Secondary | ICD-10-CM

## 2015-06-18 DIAGNOSIS — E039 Hypothyroidism, unspecified: Secondary | ICD-10-CM | POA: Diagnosis present

## 2015-06-18 DIAGNOSIS — E782 Mixed hyperlipidemia: Secondary | ICD-10-CM | POA: Diagnosis present

## 2015-06-18 DIAGNOSIS — J449 Chronic obstructive pulmonary disease, unspecified: Secondary | ICD-10-CM | POA: Diagnosis present

## 2015-06-18 DIAGNOSIS — Z9114 Patient's other noncompliance with medication regimen: Secondary | ICD-10-CM | POA: Diagnosis not present

## 2015-06-18 DIAGNOSIS — E1165 Type 2 diabetes mellitus with hyperglycemia: Secondary | ICD-10-CM | POA: Diagnosis present

## 2015-06-18 HISTORY — DX: Heart failure, unspecified: I50.9

## 2015-06-18 HISTORY — DX: Reserved for inherently not codable concepts without codable children: IMO0001

## 2015-06-18 LAB — CBC
HEMATOCRIT: 29.1 % — AB (ref 39.0–52.0)
HEMOGLOBIN: 9.1 g/dL — AB (ref 13.0–17.0)
MCH: 23.6 pg — ABNORMAL LOW (ref 26.0–34.0)
MCHC: 31.3 g/dL (ref 30.0–36.0)
MCV: 75.6 fL — AB (ref 78.0–100.0)
Platelets: 164 10*3/uL (ref 150–400)
RBC: 3.85 MIL/uL — ABNORMAL LOW (ref 4.22–5.81)
RDW: 20.8 % — AB (ref 11.5–15.5)
WBC: 8.2 10*3/uL (ref 4.0–10.5)

## 2015-06-18 LAB — PHOSPHORUS: Phosphorus: 3.5 mg/dL (ref 2.5–4.6)

## 2015-06-18 LAB — BASIC METABOLIC PANEL
ANION GAP: 12 (ref 5–15)
BUN: 38 mg/dL — AB (ref 6–20)
CHLORIDE: 103 mmol/L (ref 101–111)
CO2: 24 mmol/L (ref 22–32)
Calcium: 8.9 mg/dL (ref 8.9–10.3)
Creatinine, Ser: 3.15 mg/dL — ABNORMAL HIGH (ref 0.61–1.24)
GFR calc Af Amer: 23 mL/min — ABNORMAL LOW (ref >60)
GFR calc non Af Amer: 20 mL/min — ABNORMAL LOW (ref >60)
Glucose, Bld: 289 mg/dL — ABNORMAL HIGH (ref 65–99)
POTASSIUM: 4.5 mmol/L (ref 3.5–5.1)
SODIUM: 139 mmol/L (ref 135–145)

## 2015-06-18 LAB — HEPATIC FUNCTION PANEL
ALT: 21 U/L (ref 17–63)
AST: 23 U/L (ref 15–41)
Albumin: 3.1 g/dL — ABNORMAL LOW (ref 3.5–5.0)
Alkaline Phosphatase: 82 U/L (ref 38–126)
BILIRUBIN TOTAL: 0.7 mg/dL (ref 0.3–1.2)
Total Protein: 6.2 g/dL — ABNORMAL LOW (ref 6.5–8.1)

## 2015-06-18 LAB — I-STAT TROPONIN, ED: Troponin i, poc: 0.04 ng/mL (ref 0.00–0.08)

## 2015-06-18 LAB — GLUCOSE, CAPILLARY
Glucose-Capillary: 279 mg/dL — ABNORMAL HIGH (ref 65–99)
Glucose-Capillary: 237 mg/dL — ABNORMAL HIGH (ref 65–99)

## 2015-06-18 LAB — TROPONIN I
Troponin I: <0.03 ng/mL (ref <0.031)
Troponin I: <0.03 ng/mL (ref <0.031)

## 2015-06-18 LAB — BRAIN NATRIURETIC PEPTIDE: B NATRIURETIC PEPTIDE 5: 229.9 pg/mL — AB (ref 0.0–100.0)

## 2015-06-18 LAB — MAGNESIUM: Magnesium: 1.8 mg/dL (ref 1.7–2.4)

## 2015-06-18 LAB — TSH: TSH: 12.559 u[IU]/mL — ABNORMAL HIGH (ref 0.350–4.500)

## 2015-06-18 MED ORDER — SODIUM CHLORIDE 0.9% FLUSH
3.0000 mL | Freq: Two times a day (BID) | INTRAVENOUS | Status: DC
  Administered 2015-06-18 – 2015-06-22 (×6): 3 mL via INTRAVENOUS

## 2015-06-18 MED ORDER — LABETALOL HCL 5 MG/ML IV SOLN
10.0000 mg | INTRAVENOUS | Status: DC | PRN
Start: 2015-06-18 — End: 2015-06-22

## 2015-06-18 MED ORDER — ACETAMINOPHEN 500 MG PO TABS: 500.0000 mg | ORAL_TABLET | Freq: Four times a day (QID) | ORAL | Status: DC | PRN

## 2015-06-18 MED ORDER — POLYETHYLENE GLYCOL 3350 17 G PO PACK: 17.0000 g | PACK | Freq: Every day | ORAL | Status: DC | PRN

## 2015-06-18 MED ORDER — PANTOPRAZOLE SODIUM 40 MG PO TBEC
40.0000 mg | DELAYED_RELEASE_TABLET | Freq: Every day | ORAL | Status: DC
Start: 2015-06-19 — End: 2015-06-22
  Administered 2015-06-19 – 2015-06-22 (×4): 40 mg via ORAL
  Filled 2015-06-18 (×5): qty 1

## 2015-06-18 MED ORDER — INSULIN GLARGINE 100 UNIT/ML ~~LOC~~ SOLN
45.0000 [IU] | Freq: Two times a day (BID) | SUBCUTANEOUS | Status: DC
  Administered 2015-06-18 – 2015-06-20 (×4): 45 [IU] via SUBCUTANEOUS
  Filled 2015-06-18 (×5): qty 0.45

## 2015-06-18 MED ORDER — CARVEDILOL 25 MG PO TABS
50.0000 mg | ORAL_TABLET | Freq: Two times a day (BID) | ORAL | Status: DC
  Administered 2015-06-18: 50 mg via ORAL
  Filled 2015-06-18: qty 2

## 2015-06-18 MED ORDER — CARVEDILOL 25 MG PO TABS
25.0000 mg | ORAL_TABLET | Freq: Two times a day (BID) | ORAL | Status: DC
  Administered 2015-06-19 – 2015-06-20 (×4): 25 mg via ORAL
  Filled 2015-06-18 (×4): qty 1

## 2015-06-18 MED ORDER — AMLODIPINE BESYLATE 10 MG PO TABS
10.0000 mg | ORAL_TABLET | Freq: Every day | ORAL | Status: DC
  Administered 2015-06-19 – 2015-06-22 (×4): 10 mg via ORAL
  Filled 2015-06-18 (×5): qty 1

## 2015-06-18 MED ORDER — MOMETASONE FURO-FORMOTEROL FUM 200-5 MCG/ACT IN AERO: 2.0000 | INHALATION_SPRAY | Freq: Two times a day (BID) | RESPIRATORY_TRACT | Status: DC

## 2015-06-18 MED ORDER — ACETAMINOPHEN 325 MG PO TABS
650.0000 mg | ORAL_TABLET | Freq: Four times a day (QID) | ORAL | Status: DC | PRN
  Administered 2015-06-21: 650 mg via ORAL
  Filled 2015-06-18 (×2): qty 2

## 2015-06-18 MED ORDER — ASPIRIN EC 81 MG PO TBEC
81.0000 mg | DELAYED_RELEASE_TABLET | Freq: Every day | ORAL | Status: DC
  Administered 2015-06-19 – 2015-06-22 (×4): 81 mg via ORAL
  Filled 2015-06-18 (×5): qty 1

## 2015-06-18 MED ORDER — HEPARIN SODIUM (PORCINE) 5000 UNIT/ML IJ SOLN
5000.0000 [IU] | Freq: Three times a day (TID) | INTRAMUSCULAR | Status: DC
  Administered 2015-06-18 – 2015-06-22 (×12): 5000 [IU] via SUBCUTANEOUS
  Filled 2015-06-18 (×12): qty 1

## 2015-06-18 MED ORDER — ATORVASTATIN CALCIUM 40 MG PO TABS
40.0000 mg | ORAL_TABLET | Freq: Every day | ORAL | Status: DC
  Administered 2015-06-18 – 2015-06-22 (×5): 40 mg via ORAL
  Filled 2015-06-18 (×5): qty 1

## 2015-06-18 MED ORDER — NITROGLYCERIN 2 % TD OINT
1.0000 [in_us] | TOPICAL_OINTMENT | Freq: Four times a day (QID) | TRANSDERMAL | Status: DC
  Administered 2015-06-18 – 2015-06-22 (×17): 1 [in_us] via TOPICAL
  Filled 2015-06-18 (×11): qty 30
  Filled 2015-06-18: qty 1
  Filled 2015-06-18 (×18): qty 30

## 2015-06-18 MED ORDER — FUROSEMIDE 10 MG/ML IJ SOLN
60.0000 mg | Freq: Once | INTRAMUSCULAR | Status: AC
  Administered 2015-06-18: 60 mg via INTRAVENOUS
  Filled 2015-06-18: qty 6

## 2015-06-18 MED ORDER — INSULIN ASPART 100 UNIT/ML ~~LOC~~ SOLN
0.0000 [IU] | Freq: Three times a day (TID) | SUBCUTANEOUS | Status: DC
Start: 2015-06-18 — End: 2015-06-21
  Administered 2015-06-18: 11 [IU] via SUBCUTANEOUS
  Administered 2015-06-19 (×2): 7 [IU] via SUBCUTANEOUS
  Administered 2015-06-19 – 2015-06-20 (×2): 4 [IU] via SUBCUTANEOUS
  Administered 2015-06-20: 7 [IU] via SUBCUTANEOUS
  Administered 2015-06-20: 4 [IU] via SUBCUTANEOUS
  Administered 2015-06-21: 7 [IU] via SUBCUTANEOUS

## 2015-06-18 MED ORDER — ALBUTEROL SULFATE (2.5 MG/3ML) 0.083% IN NEBU: 3.0000 mL | INHALATION_SOLUTION | Freq: Four times a day (QID) | RESPIRATORY_TRACT | Status: DC | PRN

## 2015-06-18 MED ORDER — ACETAMINOPHEN 650 MG RE SUPP: 650.0000 mg | Freq: Four times a day (QID) | RECTAL | Status: DC | PRN

## 2015-06-18 NOTE — H&P (Signed)
[facility] Admission History and Physical Service Pager: [phone]  [name] Medical record number: [id number] Date of birth: 1950-04-21 Age: 65 y.o. Gender: male  Primary Care Provider: Clearance Coots, MD Consultants: None Code Status: Full  Chief Complaint:   Assessment and Plan: Leonard [name] is a 65 y.o. male presenting with HTN Emergency . PMH is significant for Uncontrolled HTN 2/2 Medication Noncompliance, HFpEF, CAD s/p 3V CABG in 04/2013, PAD, HLD, Tobacco Abuse s/p Cessation, CKD IIIB, MGUS (resolved), Anemia CKD, COPD, DMII.   # Hypertension Emergency - Cr bump to 3.15, Chest pain, in the setting of severe range BP's 123456 systolic in the ED. Chest pain relieved by nitro this am, but subsequent chest tightness. Is no longer present on my evaluation. EKG unchanged, istat Trop 0.04, BNP 229 down from 489 4/30. Anemia stable / mildly increased. HTN 2/2 noncompliance and volume overload - Admit to tele / obs Dr. Ree Kida Attending - Cycle troponins see ACS R/O below.  - Labetalol and add prn's to bring down BP goal 10-20% 1 hr. Normalize over 24 hours.  - Consider nitro drip if necessary.  - Will restart home medicines slow.  - On clonidine which may be contributing rebound HTN.  - Overall need to simplify regimen for compliance vs. SNF vs. Home Health Nurse.  - PT/OT  # ACS Rule Out - Known CAD s/p CABG 04/2013, Echo two weeks ago with preserved EF and no focal wall motion abnormalities. Presenting with chest pain / tightness this am while sitting relieved by nitro. HEART score 5. BP's in the 200's likely contributing. Otherwise EKG unchanged, initial istat troponin 0.04.  - Trend troponins - TSH - Lipids done in March.  - A1C 10.3 05/2015 - EKG in the am.  - No need for new echo at this time.   # AO/ CKD III B - had significant decline in kidney function over the past year. Apparently, on records review he has been recommended  for outpatient nephrology workup / eval if we can get him to an appointment. Cr here is up to 3.15. He is fluid overloaded. I suspect injury 2/2 Acute HTN.  - HTN control.  - Check U Na, U cr - Renal ultrasound - Monitor I/O's closely.  - Hold Lasix tonight and f/u BMET in the am.  - Consider hospital renal consult if indicated.   # Chronic Diastolic HF - CHFpEF, EF 123456, G2DD, LE edema is present. He has not been taking his medicine. Likely worsening diastolic function 2/2 uncontrolled HTN. Lungs are clear to exam but show congestion on X-ray. Likely chronic increase in lymphatics / venous capacitance. BNP 229 down from 400's at last admission. EKG unchanged.  - Given lasix in the ED with good response.  - Holding tonight given Cr - Add back as able.  - Volume overload likely contributing to HTN  # CAD s/p CABG x 3 04/2013 / PAD / HLD - Hypertriglyceridemia (2/2 uncontrolled DMII), LDL > 70 on 04/2015 lipid panel.  - Continue Lipitor - Continue ASA here - Add back Coreg as able.   # DMII - poorly controlled. A1C 10.5 at last check. On Lantus 65 u BID at home, Novolog 35 u TID.  - Holding whole dose of Lantus. Started with 50U BID given ? compliance - Mod SSI - Carb modified diet.   # COPD - unstaged - Albuterol prn  # Anemia - pt. With microcytic anemia. Hgb is up  from baseline. Iron deficiency 06/04/2015. Likely iron deficiency on AOCKD.  - Not on Iron as an outpatient.  - Need to start, though may become a compliance issue.   # Hypothyroidism - listed as a problem, but pt. Not on Synthroid, last TSH is 9.6, 11/2014 - Check TSH - Start synthroid as indicated.    FEN/GI: Carb Modified / HH diet, Protonix Prophylaxis: Heparin sub q  Disposition: pending BP control.   History of Present Illness:  Leonard [name] is a 65 y.o. male presenting with chest pain that started this am at 8 o'clock while he was sitting in his chair watching TV. He has had this pain in the past  intermittently. He said it didn't necessarily feel like his typical anginal pain. Didn't feel like heartburn. He took 4 SL Nitro that helped the pain go away, but he remained with tightness in his chest. He denied SOB, diaphoresis, nausea, arm pain, neck pain, jaw pain, headache, or vision changes. He did not have any numbness, tingling, weakness in any of his extremities. He denies abdominal pain, diarrhea, or vomiting. He said that he was going to stay home, but his neighbor made him come to the hospital due  To increasing fluid in his legs over the past two weeks, and the fact that he was having chest pain. He says that he has been unable to comply with his medication regimen as an outpatient because it is too complicated for him. He has had [place] aid coming to his house who has helped him fill his pill box, but he says this aid has not been there for some time. He saw Dr. Ola Spurr in clinic and was supposed to follow up with Dr. Raeford Razor and Dr. Valentina Lucks tomorrow, but has been unable to follow through on Dr. Blane Ohara recommendations regarding his BP meds, and was hoping to talk with Dr. Valentina Lucks / Dr. Raeford Razor about them tomorrow. Otherwise, he feels at his baseline, and is not having active chest pain or tightness in the ED. He is not currently smoking (quit two years ago after CABG), he does not drink ETOH.   In the ED, BP's noted to be in the 123456 systolic, he had EKG unchanged from previous, istat troponin 0.04, BMET with Cr bump from baseline, and Hgb is higher than baseline. CXR with interstitial edema / vascular congestion. His BP remained high, I gave him Labetalol upon my interview and it came down to 184 / 79. He was found to warrant admission for Hypertensive emergency given chest pain and Cr bump, in addition to ACS rule out.    Review Of Systems: Per HPI with the following additions: none Otherwise the remainder of the systems were negative.  Patient Active Problem List   Diagnosis Date Noted   . CHF (congestive heart failure) ([place]) 06/18/2015  . Knee pain   . Renal failure   . Acute CHF (congestive heart failure) ([place]) 06/03/2015  . Chest pain 06/03/2015  . Skin lesion of face 02/22/2015  . 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 Anemia in chronic kidney disease 01/15/2015  . CKD (chronic kidney disease) stage 4, GFR 15-29 ml/min (HCC) 01/15/2015  . Hypothyroidism 11/22/2014  . Health care maintenance 11/22/2014  . Acute on chronic diastolic heart failure ([place]) 07/04/2014  . Tooth pain 05/11/2014  . Chronic diastolic heart failure ([place]) 03/16/2014  . COPD (chronic obstructive pulmonary disease) ([place]) 02/16/2014  . Diastolic heart failure ([place])   . Bilateral lower extremity edema  10/14/2013  . History of tobacco use 08/23/2013  . 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 CAD (coronary artery disease) 04/06/2013  . NSTEMI (non-ST elevated myocardial infarction) ([place]) 04/05/2013  . PVD - hx of Rt SFA PTA and s/p Rt 4-5th toe amp Dec 2014 04/05/2013  . Atherosclerosis of native arteries of the extremities with gangrene ([place]) 02/15/2013  . Normocytic anemia 01/26/2013  . Essential hypertension, benign 11/25/2012  . DM (diabetes mellitus), type 2 with renal complications ([place]) [id number]  . Mixed hyperlipidemia 07/17/2008    Past Medical History: Past Medical History  Diagnosis Date  . Hypertension   . PAD (peripheral artery disease) ([place])   . Gangrene of toe ([place])     right 5th/notes 01/04/2013   . High cholesterol   . Type II diabetes mellitus ([place])   . MGUS (monoclonal gammopathy of unknown significance) 01/15/2015    Past Surgical History: Past Surgical History  Procedure Laterality Date  . Throat surgery  1983    polyps  . Angioplasty / stenting femoral Right 01/13/2013    superficial femoral artery x 2 (3 mm x 60 mm, 4 mm x 60 mm)  . Tonsillectomy and adenoidectomy  ~ 1965  . Amputation Right 01/28/2013    Procedure: RAY AMPUTATION RIGHT  4th  & 5th TOE;  Surgeon: Rosetta Posner, MD;  Location: [facility] OR;  Service: Vascular;  Laterality: Right;  . Coronary artery bypass graft N/A 04/07/2013    Procedure: CORONARY ARTERY BYPASS GRAFTING (CABG);  Surgeon: Ivin Poot, MD;  Location: [place];  Service: Open Heart Surgery;  Laterality: N/A;  . Intraoperative transesophageal echocardiogram N/A 04/07/2013    Procedure: INTRAOPERATIVE TRANSESOPHAGEAL ECHOCARDIOGRAM;  Surgeon: Ivin Poot, MD;  Location: [place];  Service: Open Heart Surgery;  Laterality: N/A;  . Lower extremity angiogram Bilateral 01/06/2013    Procedure: LOWER EXTREMITY ANGIOGRAM;  Surgeon: Conrad Federal Dam, MD;  Location[facility] C[facility] LAB;  Service: Cardiovascular;  Laterality: Bilateral;  . Lower extremity angiogram Right 01/13/2013    Procedure: LOWER EXTREMITY ANGIOGRAM;  Surgeon: Conrad Cowley, MD;  Location: [facility] CATH LAB;  Service: Cardiovascular;  Laterality: Right;  . Left heart catheterization with coronary angiogram N/A 04/05/2013    Procedure: LEFT HEART CATHETERIZATION WITH CORONARY ANGIOGRAM;  Surgeon: Peter M Martinique, MD;  Location: [facility] CATH LAB;  Service: Cardiovascular;  Laterality: N/A;    Social History: Social History  Substance Use Topics  . Smoking status: Former Smoker -- 1.00 packs/day for 46 years    Types: Cigarettes    Start date: 02/04/1967    Quit date: 02/03/2013  . Smokeless tobacco: Never Used     Comment: Doing well with quitting.  . Alcohol Use: No   Additional social history: Difficulty with taking medications.   Please also refer to relevant sections of EMR.  Family History: Family History  Problem Relation Age of Onset  . Cancer Mother     lung cancer  . Hypertension Mother   . Cancer Sister     patient thinks it was uterine cancer  . Hypertension Sister   . Heart attack Sister   . Stroke Neg Hx    (If not completed, MUST add something in)  Allergies and Medications: No Known Allergies No current facility-administered medications on file  prior to encounter.   Current Outpatient Prescriptions on File Prior to Encounter  Medication Sig Dispense Refill  . amLODipine (NORVASC) 10 MG tablet Take 1 tablet (10 mg total) by mouth daily.  90 tablet 1  . aspirin EC 81 MG EC tablet Take 1 tablet (81 mg total) by mouth daily. 30 tablet 0  . atorvastatin (LIPITOR) 40 MG tablet TAKE 1 TABLET BY MOUTH EVERY DAY AT 6 PM 90 tablet 1  . carvedilol (COREG) 25 MG tablet Take 2 tablets (50 mg total) by mouth 2 (two) times daily with a meal. 180 tablet 3  . cloNIDine (CATAPRES) 0.3 MG tablet Take 1 tablet (0.3 mg total) by mouth 3 (three) times daily. 270 tablet 3  . furosemide (LASIX) 40 MG tablet Take 1.5 tablets (60 mg total) by mouth 2 (two) times daily. 1 and 1/2 tablet daily 135 tablet 3  . hydrALAZINE (APRESOLINE) 50 MG tablet Take 1 tablet (50 mg total) by mouth every 8 (eight) hours. 90 tablet 0  . insulin aspart (NOVOLOG FLEXPEN) 100 UNIT/ML FlexPen Inject 35 Units into the skin 3 (three) times daily with meals. 15 mL 6  . LANTUS SOLOSTAR 100 UNIT/ML Solostar Pen INJECT 65 UNITS INTO THE SKIN 2 TIMES DAILY 15 mL 2  . nitroGLYCERIN (NITROSTAT) 0.4 MG SL tablet Place 1 tablet (0.4 mg total) under the tongue every 5 (five) minutes as needed for chest pain. 30 tablet 12  . pantoprazole (PROTONIX) 40 MG tablet Take 1 tablet (40 mg total) by mouth daily. 30 tablet 1  . acetaminophen (TYLENOL) 500 MG tablet Take 1 tablet (500 mg total) by mouth every 6 (six) hours as needed for mild pain or moderate pain. 30 tablet 0  . albuterol (PROVENTIL HFA;VENTOLIN HFA) 108 (90 Base) MCG/ACT inhaler Inhale 2 puffs into the lungs every 6 (six) hours as needed for wheezing or shortness of breath. 1 Inhaler 2  . Fluticasone-Salmeterol (ADVAIR) 250-50 MCG/DOSE AEPB Inhale 1 puff into the lungs 2 (two) times daily. 60 each 0  . polyethylene glycol (MIRALAX / GLYCOLAX) packet Take 17 g by mouth daily as needed for mild constipation or moderate constipation. (Patient  not taking: Reported on 06/18/2015) 14 each 0  . SURE COMFORT PEN NEEDLES 31G X 8 MM MISC USE WITH BOTH INSULIN PENS TWICE DAILY (4 PER DAY) 100 each 5    Objective: BP 184/79 mmHg  Pulse 67  Temp(Src) 99.3 F (37.4 C) (Oral)  Resp 18  Wt 248 lb 3 oz (112.577 kg)  SpO2 92% Exam: General: NAD, resting comfortably Eyes: EOMI, PERRLA ENTM: Nares patent, O/P clear without erythema Neck: FROM, Supple Cardiovascular: RRR, no MGR, Loud S1, normal S2, 2+ distal pulses Respiratory: CTA Bilaterally, maybe slight crackles in the bases. Appropriate rate, unlabored.  Abdomen: Obese, S, NT, Mild distension, no abdominal wall edema.  MSK: 3+ / 4+ edema of BL LE to the mid-thigh, amputation of R fifth digit, otherwise no ulcerations or abnormalities noted.  Skin: No rashes, no lesions Neuro: Grossly in tact.  Psych: Appropriate mood / affect.   Labs and Imaging: CBC BMET   Recent Labs Lab 06/18/15 1216  WBC 8.2  HGB 9.1*  HCT 29.1*  PLT 164    Recent Labs Lab 06/18/15 1216  NA 139  K 4.5  CL 103  CO2 24  BUN 38*  CREATININE 3.15*  GLUCOSE 289*  CALCIUM 8.9     Trop 0.04 EKG - NSR, Chronic / stable TWI in lateral leads, unchanged.  CXR - vascular congestion.   Aquilla Hacker, MD 06/18/2015, 3:11 PM PGY-2, [facility] pager: [phone], text pages welcome

## 2015-06-18 NOTE — ED Notes (Signed)
Pt presents via [facility] for chest pain that began this AM around 0800.  Pt took 4NTG at home decreasing pain from 4/10 to 2/10.  324 ASA given by EMS.  EKG unremarkable.  Denies N/V/diaphoresis/SOB.  Hx: MI, CABG, HTN.  220-88 P-74 NSR, CBG-334.  18g LFA.  A x 4, NAD, denies pain at current.

## 2015-06-18 NOTE — ED Notes (Signed)
Dr. Dayna Barker and [facility] - RN aware of pt's BP.

## 2015-06-18 NOTE — ED Provider Notes (Signed)
CSN: [id number]     Arrival date & time 06/18/15  1201 History   First MD Initiated Contact with Patient 06/18/15 1211     Chief Complaint  Patient presents with  . Chest Pain     (Consider location/radiation/quality/duration/timing/severity/associated sxs/prior Treatment) The history is provided by the patient.     Pt with hx CAD, MI, s/p 3-vessel CABG 2015, CHG, COPD (gold 3), CKD4, DM, MGUS, PAD, prior heavy tobacco usage p/w central chest tightness that began this morning.  It began suddenly while pt was sitting.  Took 4 nitro pills over 30 minutes with relief.  Has had recent admission for chest pain, treated for CHF exacerbation.  At follow up PCP appt 5/9, found to not understand d/c medication, had gained 10 lbs, increased peripheral edema.  Pt states he is now taking the medication but is missing doses because it is not in his pill box.  Continues to have significant peripheral edema.  He does not check weights at home.  Denies fevers, cough, SOB, abdominal pain.     Past Medical History  Diagnosis Date  . Hypertension   . PAD (peripheral artery disease) ([place])   . Gangrene of toe ([place])     right 5th/notes 01/04/2013   . High cholesterol   . Type II diabetes mellitus ([place])   . MGUS (monoclonal gammopathy of unknown significance) 01/15/2015   Past Surgical History  Procedure Laterality Date  . Throat surgery  1983    polyps  . Angioplasty / stenting femoral Right 01/13/2013    superficial femoral artery x 2 (3 mm x 60 mm, 4 mm x 60 mm)  . Tonsillectomy and adenoidectomy  ~ 1965  . Amputation Right 01/28/2013    Procedure: RAY AMPUTATION RIGHT  4th & 5th TOE;  Surgeon: Rosetta Posner, MD;  Location: [facility] OR;  Service: Vascular;  Laterality: Right;  . Coronary artery bypass graft N/A 04/07/2013    Procedure: CORONARY ARTERY BYPASS GRAFTING (CABG);  Surgeon: Ivin Poot, MD;  Location: [place];  Service: Open Heart Surgery;  Laterality: N/A;  . Intraoperative transesophageal  echocardiogram N/A 04/07/2013    Procedure: INTRAOPERATIVE TRANSESOPHAGEAL ECHOCARDIOGRAM;  Surgeon: Ivin Poot, MD;  Location: [place];  Service: Open Heart Surgery;  Laterality: N/A;  . Lower extremity angiogram Bilateral 01/06/2013    Procedure: LOWER EXTREMITY ANGIOGRAM;  Surgeon: Conrad Cheswick, MD;  Location[facility] CATH LAB;  Service: Cardiovascular;  Laterality: Bilateral;  . Lower extremity angiogram Right 01/13/2013    Procedure: LOWER EXTREMITY ANGIOGRAM;  Surgeon: Conrad Ishpeming, MD;  Location: [facility] CATH LAB;  Service: Cardiovascular;  Laterality: Right;  . Left heart catheterization with coronary angiogram N/A 04/05/2013    Procedure: LEFT HEART CATHETERIZATION WITH CORONARY ANGIOGRAM;  Surgeon: Peter M Martinique, MD;  Location: [facility] CATH LAB;  Service: Cardiovascular;  Laterality: N/A;   Family History  Problem Relation Age of Onset  . Cancer Mother     lung cancer  . Hypertension Mother   . Cancer Sister     patient thinks it was uterine cancer  . Hypertension Sister   . Heart attack Sister   . Stroke Neg Hx    Social History  Substance Use Topics  . Smoking status: Former Smoker -- 1.00 packs/day for 46 years    Types: Cigarettes    Start date: 02/04/1967    Quit date: 02/03/2013  . Smokeless tobacco: Never Used     Comment: Doing well with quitting.  Marland Kitchen  Alcohol Use: No    Review of Systems  All other systems reviewed and are negative.     Allergies  Review of patient's allergies indicates no known allergies.  Home Medications   Prior to Admission medications   Medication Sig Start Date End Date Taking? Authorizing Provider  acetaminophen (TYLENOL) 500 MG tablet Take 1 tablet (500 mg total) by mouth every 6 (six) hours as needed for mild pain or moderate pain. 06/06/15   Mercy Riding, MD  albuterol (PROVENTIL HFA;VENTOLIN HFA) 108 (90 Base) MCG/ACT inhaler Inhale 2 puffs into the lungs every 6 (six) hours as needed for wheezing or shortness of breath. 06/12/15   Hillary Corinda Gubler, MD  amLODipine (NORVASC) 10 MG tablet Take 1 tablet (10 mg total) by mouth daily. 03/23/15   Zenia Resides, MD  aspirin EC 81 MG EC tablet Take 1 tablet (81 mg total) by mouth daily. 06/06/15   Mercy Riding, MD  atorvastatin (LIPITOR) 40 MG tablet TAKE 1 TABLET BY MOUTH EVERY DAY AT 6 PM 03/23/15   Zenia Resides, MD  carvedilol (COREG) 25 MG tablet Take 2 tablets (50 mg total) by mouth 2 (two) times daily with a meal. 04/05/15   Zenia Resides, MD  cloNIDine (CATAPRES) 0.3 MG tablet Take 1 tablet (0.3 mg total) by mouth 3 (three) times daily. 04/05/15   Zenia Resides, MD  Fluticasone-Salmeterol (ADVAIR) 250-50 MCG/DOSE AEPB Inhale 1 puff into the lungs 2 (two) times daily. 06/12/15   Hillary Corinda Gubler, MD  furosemide (LASIX) 40 MG tablet Take 1.5 tablets (60 mg total) by mouth 2 (two) times daily. 1 and 1/2 tablet daily 04/05/15   Zenia Resides, MD  hydrALAZINE (APRESOLINE) 50 MG tablet Take 1 tablet (50 mg total) by mouth every 8 (eight) hours. 06/07/15   Mercy Riding, MD  insulin aspart (NOVOLOG FLEXPEN) 100 UNIT/ML FlexPen Inject 35 Units into the skin 3 (three) times daily with meals. 05/17/15   Zenia Resides, MD  LANTUS SOLOSTAR 100 UNIT/ML Solostar Pen INJECT 65 UNITS INTO THE SKIN 2 TIMES DAILY 06/13/15   Olin Hauser, DO  nitroGLYCERIN (NITROSTAT) 0.4 MG SL tablet Place 1 tablet (0.4 mg total) under the tongue every 5 (five) minutes as needed for chest pain. 06/06/15   Mercy Riding, MD  pantoprazole (PROTONIX) 40 MG tablet Take 1 tablet (40 mg total) by mouth daily. 06/06/15   Mercy Riding, MD  polyethylene glycol (MIRALAX / GLYCOLAX) packet Take 17 g by mouth daily as needed for mild constipation or moderate constipation. 06/06/15   Mercy Riding, MD  SURE COMFORT PEN NEEDLES 31G X 8 MM MISC USE WITH BOTH INSULIN PENS TWICE DAILY (4 PER DAY) 06/14/15   Alexander J Karamalegos, DO   BP 204/81 mmHg  Pulse 77  Temp(Src) 98.5 F (36.9 C) (Oral)  Resp 26  SpO2  95% Physical Exam  Constitutional: He appears well-developed and well-nourished. No distress.  HENT:  Head: Normocephalic and atraumatic.  Neck: Neck supple.  Cardiovascular: Normal rate and regular rhythm.   Pulmonary/Chest: Effort normal. No respiratory distress. He has no decreased breath sounds. He has no wheezes. He has rhonchi. He has no rales.  Abdominal: Soft. He exhibits no distension and no mass. There is no tenderness. There is no rebound and no guarding.  Musculoskeletal: He exhibits edema (bilateral lower extremity pitting edema. ).  Neurological: He is alert. He exhibits normal muscle tone.  Skin: He is  not diaphoretic.  Nursing note and vitals reviewed.   ED Course  Procedures (including critical care time) Labs Review Labs Reviewed  BASIC METABOLIC PANEL - Abnormal; Notable for the following:    Glucose, Bld 289 (*)    BUN 38 (*)    Creatinine, Ser 3.15 (*)    GFR calc non Af Amer 20 (*)    GFR calc Af Amer 23 (*)    All other components within normal limits  CBC - Abnormal; Notable for the following:    RBC 3.85 (*)    Hemoglobin 9.1 (*)    HCT 29.1 (*)    MCV 75.6 (*)    MCH 23.6 (*)    RDW 20.8 (*)    All other components within normal limits  BRAIN NATRIURETIC PEPTIDE - Abnormal; Notable for the following:    B Natriuretic Peptide 229.9 (*)    All other components within normal limits  I-STAT TROPOININ, ED    Imaging Review Dg Chest 2 View  06/18/2015  CLINICAL DATA:  Mid to left-sided chest pain and lower extremity edema. EXAM: CHEST  2 VIEW COMPARISON:  06/03/2015 FINDINGS: The heart size is stable and within normal limits status post prior CABG. The aorta shows stable tortuosity. Lungs show evidence of interstitial edema. No significant pleural fluid identified. No focal airspace consolidation, nodule or pneumothorax identified. The bony thorax is unremarkable. IMPRESSION: Pulmonary interstitial edema. Electronically Signed   By: Aletta Edouard M.D.    On: 06/18/2015 12:58   I have personally reviewed and evaluated these images and lab results as part of my medical decision-making.   EKG Interpretation   Date/Time:  Monday Jun 18 2015 12:01:31 EDT Ventricular Rate:  76 PR Interval:  145 QRS Duration: 93 QT Interval:  410 QTC Calculation: 461 R Axis:   48 Text Interpretation:  Sinus rhythm Probable left atrial enlargement  Nonspecific T abnormalities, lateral leads Minimal ST elevation, anterior  leads No significant change since last tracing Reconfirmed by [facility] MD,  Corene Cornea [phone]) on 06/18/2015 1:39:58 PM       12:31 PM Currently chest pain free   MDM   Final diagnoses:  Acute on chronic congestive heart failure, unspecified congestive heart failure type (HCC)  Renal failure (ARF), acute on chronic (HCC)  Chest pain, unspecified chest pain type    Pt with significant medical problems including CAD s/p CABG, CHF, COPD, CKDIV, DM p/w chest tightness and increased peripheral edema.  Pt not taking his new medications as directed, has increased fluid overload in peripheral edema and now on chest xray.  Renal function has also worsened.  Baseline creatinine noted to be 2.5, now 3.1.  After recent admission for chest pain pt was scheduled for outpatient lexiscan and cardiology follow up, which he has not yet had (scheduled for 06/28/15).  Admitted to [facility] for overnight obs for chest pain and diuresis.    Clayton Bibles, PA-C 06/18/15 1519  Merrily Pew, MD 06/18/15 1550

## 2015-06-19 ENCOUNTER — Ambulatory Visit: Payer: Medicaid Other | Admitting: Family Medicine

## 2015-06-19 ENCOUNTER — Other Ambulatory Visit: Payer: Self-pay

## 2015-06-19 ENCOUNTER — Ambulatory Visit: Payer: Medicaid Other | Admitting: Pharmacist

## 2015-06-19 ENCOUNTER — Inpatient Hospital Stay (HOSPITAL_COMMUNITY): Payer: Medicaid Other

## 2015-06-19 DIAGNOSIS — N183 Chronic kidney disease, stage 3 unspecified: Secondary | ICD-10-CM | POA: Insufficient documentation

## 2015-06-19 LAB — COMPREHENSIVE METABOLIC PANEL
ALT: 21 U/L (ref 17–63)
AST: 18 U/L (ref 15–41)
Albumin: 3.2 g/dL — ABNORMAL LOW (ref 3.5–5.0)
Alkaline Phosphatase: 81 U/L (ref 38–126)
Anion gap: 12 (ref 5–15)
BUN: 39 mg/dL — AB (ref 6–20)
CHLORIDE: 103 mmol/L (ref 101–111)
CO2: 27 mmol/L (ref 22–32)
CREATININE: 2.8 mg/dL — AB (ref 0.61–1.24)
Calcium: 9 mg/dL (ref 8.9–10.3)
GFR calc Af Amer: 26 mL/min — ABNORMAL LOW (ref >60)
GFR, EST NON AFRICAN AMERICAN: 22 mL/min — AB (ref >60)
Glucose, Bld: 231 mg/dL — ABNORMAL HIGH (ref 65–99)
Potassium: 4 mmol/L (ref 3.5–5.1)
Sodium: 142 mmol/L (ref 135–145)
Total Bilirubin: 0.7 mg/dL (ref 0.3–1.2)
Total Protein: 6.3 g/dL — ABNORMAL LOW (ref 6.5–8.1)

## 2015-06-19 LAB — SODIUM, URINE, RANDOM: SODIUM UR: 110 mmol/L

## 2015-06-19 LAB — GLUCOSE, CAPILLARY
GLUCOSE-CAPILLARY: 205 mg/dL — AB (ref 65–99)
GLUCOSE-CAPILLARY: 236 mg/dL — AB (ref 65–99)
Glucose-Capillary: 187 mg/dL — ABNORMAL HIGH (ref 65–99)
Glucose-Capillary: 145 mg/dL — ABNORMAL HIGH (ref 65–99)

## 2015-06-19 LAB — CBC
HCT: 28 % — ABNORMAL LOW (ref 39.0–52.0)
Hemoglobin: 8.4 g/dL — ABNORMAL LOW (ref 13.0–17.0)
MCH: 22.6 pg — AB (ref 26.0–34.0)
MCHC: 30 g/dL (ref 30.0–36.0)
MCV: 75.5 fL — AB (ref 78.0–100.0)
PLATELETS: 152 10*3/uL (ref 150–400)
RBC: 3.71 MIL/uL — ABNORMAL LOW (ref 4.22–5.81)
RDW: 20.8 % — AB (ref 11.5–15.5)
WBC: 7.5 10*3/uL (ref 4.0–10.5)

## 2015-06-19 LAB — TROPONIN I: Troponin I: 0.03 ng/mL (ref <0.031)

## 2015-06-19 LAB — PROTIME-INR
INR: 1.11 (ref 0.00–1.49)
Prothrombin Time: 14.5 seconds (ref 11.6–15.2)

## 2015-06-19 LAB — CREATININE, URINE, RANDOM: CREATININE, URINE: 57.41 mg/dL

## 2015-06-19 MED ORDER — FUROSEMIDE 80 MG PO TABS
80.0000 mg | ORAL_TABLET | Freq: Two times a day (BID) | ORAL | Status: DC
  Administered 2015-06-19 – 2015-06-21 (×4): 80 mg via ORAL
  Filled 2015-06-19 (×4): qty 1

## 2015-06-19 MED ORDER — HYDRALAZINE HCL 25 MG PO TABS
25.0000 mg | ORAL_TABLET | Freq: Three times a day (TID) | ORAL | Status: DC
  Administered 2015-06-19 – 2015-06-20 (×3): 25 mg via ORAL
  Filled 2015-06-19 (×3): qty 1

## 2015-06-19 MED ORDER — LEVOTHYROXINE SODIUM 50 MCG PO TABS
50.0000 ug | ORAL_TABLET | Freq: Every day | ORAL | Status: DC
  Administered 2015-06-20 – 2015-06-22 (×3): 50 ug via ORAL
  Filled 2015-06-19 (×3): qty 1

## 2015-06-19 MED ORDER — WHITE PETROLATUM GEL
Status: DC | PRN
  Administered 2015-06-19: 0.2 via TOPICAL
  Filled 2015-06-19 (×2): qty 28.35

## 2015-06-19 NOTE — Evaluation (Signed)
Occupational Therapy Evaluation Patient Details Name: Leonard [name] MRN: [id number] DOB: [birth year] Today's Date: 06/19/2015    History of Present Illness Leonard [name] is a 65 y.o. male presenting with chest pain and dyspnea.  Troponin levels are stable.  Monitor BP.  Pt with CHF.    Clinical Impression   Pt admitted with the above diagnosis and has the deficits listed below. Pt would benefit from cont OT to increase independence with basic adls and efficiency with adls so he can dc home safely with his sister.  Pt with very poor activity tolerance and needs to be educated on energy conservation techniques for home.  Pt also with BP of 184/69 and nursing aware.      Follow Up Recommendations  Home health OT;Supervision/Assistance - 24 hour (24 hour assist only to start for first few days.)    Equipment Recommendations  None recommended by OT    Recommendations for Other Services       Precautions / Restrictions Precautions Precautions: Fall Precaution Comments: Most recent fall was last October in his apartment Restrictions Weight Bearing Restrictions: No      Mobility Bed Mobility               General bed mobility comments: up in chair  Transfers Overall transfer level: Needs assistance Equipment used: Rolling walker (2 wheeled) Transfers: Sit to/from [facility] Sit to Stand: Min guard Stand pivot transfers: Min guard       General transfer comment: Pt used rolling walker.  Did talk to pt about hand positioning when standing and when sitting.      Balance Overall balance assessment: Needs assistance Sitting-balance support: Bilateral upper extremity supported;Feet supported Sitting balance-Leahy Scale: Good     Standing balance support: During functional activity;Bilateral upper extremity supported Standing balance-Leahy Scale: Fair Standing balance comment: Pt able to stand at sink to groom without support for short amounts of  time and can let go of walker to manage clothing in standing with no LOB.                            ADL Overall ADL's : Needs assistance/impaired Eating/Feeding: Independent;Sitting   Grooming: Wash/dry face;Oral care;Min guard;Standing Grooming Details (indicate cue type and reason): Pt able to to complete the tasks with no problem.  Pt will be safest doing these tasks in sitting at home just because he fatigues so quickly. Upper Body Bathing: Set up;Sitting   Lower Body Bathing: Minimal assistance;Sit to/from stand Lower Body Bathing Details (indicate cue type and reason): PT requires a great amount of time to complete bathing and assist to reach bottom in standing and keep balance. Upper Body Dressing : Set up;Sitting   Lower Body Dressing: Minimal assistance;Sit to/from stand;Cueing for safety Lower Body Dressing Details (indicate cue type and reason): Pt requires extra time to complete the task due to fatiguing quickly but can dress LE.  Min assist needed for balance in standing. Toilet Transfer: Min guard;Ambulation;Comfort height toilet;Grab bars;RW Toilet Transfer Details (indicate cue type and reason): Pt walked to bathroom with min guard to toilet. Toileting- Water quality scientist and Hygiene: Min guard;Sit to/from stand;Cueing for safety Toileting - Clothing Manipulation Details (indicate cue type and reason): Cues for hand placement when standing from toilet.     Functional mobility during ADLs: Min guard;Rolling walker General ADL Comments: Pt is able to seperately do all his adls.  When having to bathe and dress  all at once, pt fatigues very quickly.  Pt currently has an aid that assists with the shower and he dresses himself. This seems reasonable given his current medical status today.     Vision Vision Assessment?: No apparent visual deficits   Perception     Praxis      Pertinent Vitals/Pain Pain Assessment: No/denies pain     Hand Dominance  Right   Extremity/Trunk Assessment Upper Extremity Assessment Upper Extremity Assessment: Overall WFL for tasks assessed   Lower Extremity Assessment Lower Extremity Assessment: Defer to PT evaluation RLE Deficits / Details: edema bil  LLE Deficits / Details: edema   Cervical / Trunk Assessment Cervical / Trunk Assessment: Normal   Communication Communication Communication: No difficulties   Cognition Arousal/Alertness: Awake/alert Behavior During Therapy: WFL for tasks assessed/performed Overall Cognitive Status: Within Functional Limits for tasks assessed                     General Comments       Exercises       Shoulder Instructions      Home Living Family/patient expects to be discharged to:: Private residence Living Arrangements: Other relatives Available Help at Discharge: Family;Available 24 hours/day Type of Home: Apartment Home Access: Level entry     Home Layout: One level     Bathroom Shower/Tub: Tub/shower unit Shower/tub characteristics: Architectural technologist: Standard     Home Equipment: Clinical cytogeneticist - 2 wheels   Additional Comments: Pt fatigues very quickly.  Feel pt may benefit from a rollator.       Prior Functioning/Environment Level of Independence: Independent;Needs assistance  Gait / Transfers Assistance Needed: Pt walks with walker indoors and outdoors.  Fatigues quickly. ADL's / Homemaking Assistance Needed: Pt has aid 3x week to assist with shower.  Pt is able to assist with bathing but fatigues so quickly with bathing and dressing that often times cannot complete the tasks before lunch time.        OT Diagnosis: Generalized weakness   OT Problem List: Decreased activity tolerance;Impaired balance (sitting and/or standing);Decreased safety awareness;Decreased knowledge of use of DME or AE;Increased edema;Cardiopulmonary status limiting activity   OT Treatment/Interventions: Self-care/ADL training;DME and/or AE  instruction;Energy conservation    OT Goals(Current goals can be found in the care plan section) Acute Rehab OT Goals Patient Stated Goal: go home. OT Goal Formulation: With patient Time For Goal Achievement: 07/03/15 Potential to Achieve Goals: Good ADL Goals Pt Will Perform Grooming: with modified independence;standing Pt Will Perform Lower Body Bathing: with modified independence;sit to/from stand Pt Will Perform Lower Body Dressing: with modified independence;sit to/from stand Pt Will Perform Tub/Shower Transfer: Tub transfer;shower seat;ambulating;rolling walker Additional ADL Goal #1: Pt will state 3 things that he will do at home to conserve energy during adls with no cues. Additional ADL Goal #2: Pt will walk to bathroom and toilet with mod I on elevated commode seat.  OT Frequency: Min 2X/week   Barriers to D/C:    Pt states sister there most of the time but she has her own medical problems so is unable to phyisically assist him.  She does most of the cooking and homemaking.       Co-evaluation              End of Session Equipment Utilized During Treatment: Rolling walker Nurse Communication: Mobility status  Activity Tolerance: Patient limited by fatigue Patient left: in chair;with call bell/phone within reach   Time: [id number] OT Time  Calculation (min): 19 min Charges:  OT General Charges $OT Visit: 1 Procedure OT Evaluation $OT Eval Moderate Complexity: 1 Procedure G-Codes:    Glenford Peers 07/18/2015, 10:04 AM [phone]

## 2015-06-19 NOTE — NC FL2 (Signed)
[place] MEDICAID FL2 LEVEL OF CARE SCREENING TOOL     IDENTIFICATION  [name] Birthdate: December 27, 1950 Sex: male Admission Date (Current Location): 06/18/2015  [facility] and [place] Number:  Herbalist and Address:  [facility], [place] [address] [place], [place] [address]      Provider Number: [id number]  Attending Physician Name and Address:  Lupita Dawn, MD  Relative Name and Phone Number:       Current Level of Care: Hospital Recommended Level of Care: [place] Prior Approval Number:    Date Approved/Denied:   PASRR Number: [id number]  Discharge Plan: SNF    Current Diagnoses: Patient Active Problem List   Diagnosis Date Noted  . CKD (chronic kidney disease)   . CHF (congestive heart failure) ([place]) 06/18/2015  . Hypertensive emergency 06/18/2015  . Acute on chronic congestive heart failure ([place])   . Pain in the chest   . Renal failure (ARF), acute on chronic (HCC)   . Knee pain   . Renal failure   . Acute CHF (congestive heart failure) ([place]) 06/03/2015  . Chest pain 06/03/2015  . Skin lesion of face 02/22/2015  . 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 Anemia in chronic kidney disease 01/15/2015  . CKD (chronic kidney disease) stage 4, GFR 15-29 ml/min (HCC) 01/15/2015  . Hypothyroidism 11/22/2014  . Health care maintenance 11/22/2014  . Acute on chronic diastolic heart failure ([place]) 07/04/2014  . Tooth pain 05/11/2014  . Chronic diastolic heart failure ([place]) 03/16/2014  . COPD (chronic obstructive pulmonary disease) ([place]) 02/16/2014  . Diastolic heart failure ([place])   . Bilateral lower extremity edema 10/14/2013  . History of tobacco use 08/23/2013  . 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 CAD (coronary artery disease) 04/06/2013  . NSTEMI (non-ST elevated myocardial infarction) ([place]) 04/05/2013  . PVD - hx of Rt SFA PTA and s/p Rt 4-5th toe amp Dec 2014 04/05/2013  .  Atherosclerosis of native arteries of the extremities with gangrene ([place]) 02/15/2013  . Normocytic anemia 01/26/2013  . Essential hypertension, benign 11/25/2012  . DM (diabetes mellitus), type 2 with renal complications ([place]) [id number]  . Mixed hyperlipidemia 07/17/2008    Orientation RESPIRATION BLADDER Height & Weight     Self, Time, Situation, Place  Normal Continent Weight: 236 lb 6.4 oz (107.23 kg) Height:  5\' 8"  (172.7 cm)  BEHAVIORAL SYMPTOMS/MOOD NEUROLOGICAL BOWEL NUTRITION STATUS   (None)  (None) Continent Diet (Heart healthy/carb modified)  AMBULATORY STATUS COMMUNICATION OF NEEDS Skin   Limited Assist Verbally Normal                       Personal Care Assistance Level of Assistance  Bathing, Feeding, Dressing Bathing Assistance: Limited assistance Feeding assistance: Independent Dressing Assistance: Limited assistance     Functional Limitations Info  Sight, Speech, Hearing Sight Info: Adequate Hearing Info: Adequate Speech Info: Adequate    SPECIAL CARE FACTORS FREQUENCY  PT (By licensed PT), OT (By licensed OT), Blood pressure, Diabetic urine testing     PT Frequency: 5 x week OT Frequency: 5 x week            Contractures Contractures Info: Not present    Additional Factors Info  Code Status, Allergies Code Status Info: Full Allergies Info: NKDA           Current Medications (06/19/2015):  This is the current hospital active medication list Current  Facility-Administered Medications  Medication Dose Route Frequency Provider Last Rate Last Dose  . acetaminophen (TYLENOL) tablet 650 mg  650 mg Oral Q6H PRN Aquilla Hacker, MD       Or  . acetaminophen (TYLENOL) suppository 650 mg  650 mg Rectal Q6H PRN Caleb G Melancon, MD      . albuterol (PROVENTIL) (2.5 MG/3ML) 0.083% nebulizer solution 3 mL  3 mL Inhalation Q6H PRN York Ram Melancon, MD      . amLODipine (NORVASC) tablet 10 mg  10 mg Oral Daily Aquilla Hacker, MD   10 mg at 06/19/15  1057  . aspirin EC tablet 81 mg  81 mg Oral Daily Aquilla Hacker, MD   81 mg at 06/19/15 1057  . atorvastatin (LIPITOR) tablet 40 mg  40 mg Oral q1800 Aquilla Hacker, MD   40 mg at 06/18/15 1653  . carvedilol (COREG) tablet 25 mg  25 mg Oral BID WC Lupita Dawn, MD   25 mg at 06/19/15 0653  . furosemide (LASIX) tablet 80 mg  80 mg Oral BID Sela Hua, MD      . heparin injection 5,000 Units  5,000 Units Subcutaneous Q8H Aquilla Hacker, MD   5,000 Units at 06/19/15 1422  . insulin aspart (novoLOG) injection 0-20 Units  0-20 Units Subcutaneous TID WC Aquilla Hacker, MD   4 Units at 06/19/15 1420  . insulin glargine (LANTUS) injection 45 Units  45 Units Subcutaneous BID Aquilla Hacker, MD   45 Units at 06/19/15 1057  . labetalol (NORMODYNE,TRANDATE) injection 10 mg  10 mg Intravenous Q2H PRN Aquilla Hacker, MD      . Derrill Memo ON 06/20/2015] levothyroxine (SYNTHROID, LEVOTHROID) tablet 50 mcg  50 mcg Oral QAC breakfast [name], MD      . nitroGLYCERIN (NITROGLYN) 2 % ointment 1 inch  1 inch Topical Q6H Clayton Bibles, PA-C   1 inch at 06/19/15 1421  . pantoprazole (PROTONIX) EC tablet 40 mg  40 mg Oral Daily Aquilla Hacker, MD   40 mg at 06/19/15 1057  . polyethylene glycol (MIRALAX / GLYCOLAX) packet 17 g  17 g Oral Daily PRN York Ram Melancon, MD      . sodium chloride flush (NS) 0.9 % injection 3 mL  3 mL Intravenous Q12H Aquilla Hacker, MD   3 mL at 06/19/15 1058  . white petrolatum (VASELINE) gel   Topical PRN Lupita Dawn, MD         Discharge Medications: Please see discharge summary for a list of discharge medications.  Relevant Imaging Results:  Relevant Lab Results:   Additional Information SS#: [id number]  Candie Chroman, LCSW

## 2015-06-19 NOTE — Evaluation (Signed)
Physical Therapy Evaluation Patient Details Name: Leonard [name] MRN: [id number] DOB: [birth year] Today's Date: 06/19/2015   History of Present Illness  Leonard [name] is a 65 y.o. male presenting with chest pain and dyspnea.  Troponin levels are stable.  Monitor BP.  Pt with CHF.   Clinical Impression  Pt admitted with above diagnosis. Primarily mobility is limited by extreme fatigue (required 4 standing rest breaks to walk 25 ft). Dyspnea 0/4; SaO2 97% on room air; BP stable. ?due to low Hgb. Pt currently with functional limitations due to the deficits listed below (see PT Problem List).  Pt will benefit from skilled PT to increase their independence and safety with mobility to allow discharge to the venue listed below.       Follow Up Recommendations Home health PT (although likely does not have qualifying diagnosis per Medic)    Equipment Recommendations  None recommended by PT    Recommendations for Other Services       Precautions / Restrictions Precautions Precautions: Fall Precaution Comments: Most recent fall was last October in his apartment Restrictions Weight Bearing Restrictions: No      Mobility  Bed Mobility               General bed mobility comments: up in chair  Transfers Overall transfer level: Needs assistance Equipment used: Rolling walker (2 wheeled) Transfers: Sit to/from Stand Sit to Stand: Min guard Stand pivot transfers: Min guard       General transfer comment: required vc for safe use of RW   Ambulation/Gait Ambulation/Gait assistance: Min guard Ambulation Distance (Feet): 25 Feet Assistive device: Rolling walker (2 wheeled) Gait Pattern/deviations: Step-through pattern;Decreased stride length;Trunk flexed   Gait velocity interpretation: Below normal speed for age/gender General Gait Details: minguard due to reports of extreme fatigue; standing rest x 4; pt demonstrated safe use of RW, including with tight turns  Marine scientist Rankin (Stroke Patients Only)       Balance Overall balance assessment: Needs assistance Sitting-balance support: Bilateral upper extremity supported;Feet supported Sitting balance-Leahy Scale: Good     Standing balance support: During functional activity;No upper extremity supported Standing balance-Leahy Scale: Fair Standing balance comment: can maintain standing without use of UEs x 30 sec at min                             Pertinent Vitals/Pain See above  Pain Assessment: Faces Faces Pain Scale: Hurts even more Pain Location: Lt knee Pain Descriptors / Indicators: Tender Pain Intervention(s): Limited activity within patient's tolerance;Monitored during session;Repositioned    Home Living Family/patient expects to be discharged to:: Private residence Living Arrangements: Other relatives (sister) Available Help at Discharge: Family;Available 24 hours/day (sister) Type of Home: Apartment Home Access: Level entry     Home Layout: One level Home Equipment: Clinical cytogeneticist - 2 wheels Additional Comments: Pt fatigues very quickly.  Feel pt may benefit from a rollator.     Prior Function Level of Independence: Needs assistance   Gait / Transfers Assistance Needed: Pt walks with walker indoors and outdoors.  Fatigues quickly. Reports this is new since last admission  ADL's / Homemaking Assistance Needed: Pt has aid 3x week to assist with shower.  Pt is able to assist with bathing but fatigues so quickly with bathing and dressing that often times cannot complete the tasks before  lunch time.        Hand Dominance   Dominant Hand: Right    Extremity/Trunk Assessment   Upper Extremity Assessment: Overall WFL for tasks assessed;Defer to OT evaluation           Lower Extremity Assessment: Generalized weakness RLE Deficits / Details: edema LLE Deficits / Details: edema  Cervical / Trunk Assessment: Normal   Communication   Communication: No difficulties  Cognition Arousal/Alertness: Awake/alert Behavior During Therapy: WFL for tasks assessed/performed Overall Cognitive Status: Within Functional Limits for tasks assessed                      General Comments General comments (skin integrity, edema, etc.): pt in recliner with feet awkwardly shifted over to rest on the bed (to his left) despite recliner's foot rest already elevated. Reported Lt knee pain (he ?'s arthritis).    Exercises General Exercises - Lower Extremity Ankle Circles/Pumps: AROM;Both;10 reps (educated for further edema management)      Assessment/Plan    PT Assessment Patient needs continued PT services  PT Diagnosis Difficulty walking;Generalized weakness   PT Problem List Decreased strength;Decreased balance;Decreased mobility;Decreased knowledge of use of DME;Decreased activity tolerance  PT Treatment Interventions DME instruction;Gait training;Functional mobility training;Therapeutic activities;Therapeutic exercise;Balance training;Patient/family education   PT Goals (Current goals can be found in the Care Plan section) Acute Rehab PT Goals Patient Stated Goal: go home. PT Goal Formulation: With patient Time For Goal Achievement: 06/26/15 Potential to Achieve Goals: Good    Frequency Min 3X/week   Barriers to discharge        Co-evaluation               End of Session   Activity Tolerance: Patient limited by fatigue Patient left: in chair;with call bell/phone within reach;with nursing/sitter in room           Time: 1145-1207 PT Time Calculation (min) (ACUTE ONLY): 22 min   Charges:   PT Evaluation $PT Eval Low Complexity: 1 Procedure     PT G Codes:        Pegge Cumberledge 07-07-2015, 12:41 PM Pager [phone]

## 2015-06-19 NOTE — Discharge Summary (Signed)
[facility] Discharge Summary  [name] Medical record number: [id number] Date of birth: 28-Aug-1950 Age: 65 y.o. Gender: male Date of Admission: 06/18/2015  Date of Discharge: 06/22/15 Admitting Physician: Lupita Dawn, MD  Primary Care Provider: Clearance Coots, MD Consultants: None  Indication for Hospitalization: Hypertensive Emergency, chest pain, AKI  Discharge Diagnoses/Problem List:  Hypertensive Emergency Chest pain, resolved Acute on Chronic Congestive Heart Failure Acute Renal Injury on CKD III CAD  Type II DM COPD Microcytic Anemia Hypothyroidism  Disposition: Home with [place], [place] nursing, [place] aide  Discharge Condition: Stable, improved  Discharge Exam: General: NAD, resting comfortably HEENT: [place]/AT, EOMI, MMM Neck: FROM, Supple Cardiovascular: RRR, no MGR, 2+ distal pulses Respiratory: CTAB. Normal work of breathing. Abdomen: +BS, soft, NT, mild distention.  MSK: Trace edema in the lower extremities Skin: No rashes, no lesions Neuro: Grossly intact.  Psych: Appropriate mood / affect.   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 In the ED, BPs were noted to be in the 200s. EKG was unchanged from previous, i-stat troponins were negative, BMET with Cr bump from baseline, CXR with interstitial edema/vascular congestion. Hospital course is described by problem list below:  Accelerated Hypertension: Pt had severe range BPs and chest pressure on admission. EKG was unremarkable. Troponins were negative x 3. We thought his hypertension was likely secondary to non-compliance and volume overload. He was taking Coreg 50mg  bid, Hydralazine 50mg  tid, Norvasc 10mg  daily, and Clonidine 0.3mg  tid. He stated that he has a hard time remembering to take these medications regularly, so his  HTN could have also been rebound HTN from missed Clonidine doses. We restarted his home Coreg, Hydralazine, and Norvasc. We stopped his Clonidine because we were worried he would continue to have rebound HTN related to non-compliance. His blood pressures continued to be elevated. We increased his Hydralazine to 75mg  tid and added Doxazosin 1mg  daily per his Nephrologist's recommendations. His BP improved to 163/52 on the day of discharge.  Unequal BPs in upper and lower extremities: We obtained orthostatics to make sure Pt was not developing orthostatic hypotension after starting Doxazosin. Orthostatics were markedly positive. Pt's BP dropped from 172/71 when lying, to 150/77 when sitting, to 118/67 when standing. We then obtained BPs in each extremity. BP in the R arm was 171/77, BP in the L arm was 164/69, BP in the left leg was 103/50, BP in the right leg was 99/76. He did not have any palpable difference in pulses between the femoral and radial arteries, but did have bruits over both femoral arteries. He had 1+ pulses in his ankles. These findings were concerning for possible coarctation of the aorta vs PAD vs aortic aneurysm. We felt that he did not need to remain in the hospital for further workup. Please consider ABIs and renal artery ultrasound. Radiology also recommended Thoracic MRA without contrast (don't want to use contrast with Cr of 2.88).  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 Baseline Cr has been 2.5-2.7 this year. FENa was 3.78, consistent with intrinsic renal disease. We encouraged Pt to follow-up with his Nephrologist as an outpatient.  CHFpEF: Pt was supposed to be taking Lasix 60mg  bid at home, but was not taking  this. BNP was 229 on admission, down from 400s at his last admission. He was given Lasix 60mg  IV x 1 in the ED. We started him on Lasix 80mg  bid while he was in the hospital. He had good urine output and his  weight decreased to baseline. We then decreased his Lasix dose to 80mg  daily.  Type II DM: Poorly controlled. Last A1c was 10.5%. CBGs remained in the mid-200s during admission. He was discharged on his home Lantus 65 units bid and Novolog 35 units tid.  Urinary Tract Infection: We checked a UA to see if he had protein or casts in his urine. UA was significant for many bacteria, small Hgb, large leukocytes, 0-5 squams, and TNTC WBCs. Pt denied dysuria but endorsed "a weird feeling" during urination. We treated him with Fosfomycin 3g x 1 while he was in the hospital, given his history of non-compliance. Urine culture grew >100,000 colonies E. Coli.  Subclinical Hypothyroidism: TSH in the hospital was 12.559. T3 was 114, T4 was 5.3. Given his TSH >10, we started him on Synthroid 55mcg daily. He will need a repeat TSH in 3-4 weeks.  Issues for Follow Up:  1. Pt's TSH was 12.559. We started him on Synthroid 22mcg daily. Please recheck TSH in 3-4 weeks. 2. Pt needs to follow-up with his Nephrologist for worsening renal function. Please make sure he has scheduled this appointment. 3. We restarted his Lasix at 80mg  bid and then decreased this dose to 80mg  daily at discharge (he was previously supposed to be taking 60mg  bid, but was not taking this and was fluid overloaded on admission). Please evaluate fluid status and adjust dose as needed. 4. Pt noted to have small Hgb and 6-30 RBCs in his UA. We thought this may be secondary to his UTI. We recommend repeat UA as an outpatient. If his hematuria persists, he should be evaluated by Nephrology. 5. Pt was discharged on his home Lantus 65 units bid and Novolog 35 units tid. Please follow-up with his blood sugars and adjust doses as needed. 6. Pt was found to have a marked difference in BPs between his upper and lower extremities. ABIs in 2014 were 0.86 and monophasic in the right and 0.54 and monophasic in the left. Please consider repeat ABIs. Radiology  recommended Thoracic MRA without contrast to evaluate for coarctation of the aorta. 7. His BPs were very difficult to control. He was discharged on the following BP regimen: Coreg 50mg  bid, Norvasc 10mg  daily, Hydralazine 75mg  tid, and Doxazosin 1mg  daily. Can consider renal US with doppler as an outpatient.  Significant Procedures: None  Significant Labs and Imaging:   Recent Labs Lab 06/20/15 0447 06/21/15 0521 06/22/15 0406  WBC 7.3 6.9 7.8  HGB 8.4* 9.1* 8.7*  HCT 27.8* 29.6* 29.2*  PLT 159 179 166    Recent Labs Lab 06/18/15 1216 06/18/15 1529 06/19/15 0350 06/20/15 0458 06/21/15 0521 06/22/15 0406  NA 139  --  142 142 140 141  K 4.5  --  4.0 3.8 4.0 3.7  CL 103  --  103 104 101 102  CO2 24  --  27 26 26 26   GLUCOSE 289*  --  231* 177* 248* 193*  BUN 38*  --  39* 37* 42* 39*  CREATININE 3.15*  --  2.80* 2.67* 2.85* 2.88*  CALCIUM 8.9  --  9.0 8.9 8.9 8.7*  MG  --  1.8  --   --   --   --   PHOS  --  3.5  --  4.3  --   --   ALKPHOS  --  82 81  --   --   --   AST  --  23 18  --   --   --   ALT  --  21 21  --   --   --   ALBUMIN  --  3.1* 3.2* 3.2*  --   --    Trop 0.03 x 3 TSH 12.559, T3 114, T4 5.3 UA: Turbid, many bacteria, small Hgb, large LE, negative nitrites, 100 protein, 6-30 RBCs, 0-5 squams, TNTC WBCs Urine culture: > 100,000 colonies E. coli  Results/Tests Pending at Time of Discharge: None  Discharge Medications:    Medication List    STOP taking these medications        cloNIDine 0.3 MG tablet  Commonly known as:  CATAPRES      TAKE these medications        acetaminophen 500 MG tablet  Commonly known as:  TYLENOL  Take 1 tablet (500 mg total) by mouth every 6 (six) hours as needed for mild pain or moderate pain.     albuterol 108 (90 Base) MCG/ACT inhaler  Commonly known as:  PROVENTIL HFA;VENTOLIN HFA  Inhale 2 puffs into the lungs every 6 (six) hours as needed for wheezing or shortness of breath.     amLODipine 10 MG tablet   Commonly known as:  NORVASC  Take 1 tablet (10 mg total) by mouth daily.     aspirin 81 MG EC tablet  Take 1 tablet (81 mg total) by mouth daily.     atorvastatin 40 MG tablet  Commonly known as:  LIPITOR  TAKE 1 TABLET BY MOUTH EVERY DAY AT 6 PM     carvedilol 25 MG tablet  Commonly known as:  COREG  Take 2 tablets (50 mg total) by mouth 2 (two) times daily with a meal.     doxazosin 1 MG tablet  Commonly known as:  CARDURA  Take 1 tablet (1 mg total) by mouth daily.     Fluticasone-Salmeterol 250-50 MCG/DOSE Aepb  Commonly known as:  ADVAIR  Inhale 1 puff into the lungs 2 (two) times daily.     furosemide 40 MG tablet  Commonly known as:  LASIX  Take 2 tablets (80 mg total) by mouth 2 (two) times daily.     hydrALAZINE 25 MG tablet  Commonly known as:  APRESOLINE  Take 3 tablets (75 mg total) by mouth every 8 (eight) hours.     insulin aspart 100 UNIT/ML FlexPen  Commonly known as:  NOVOLOG FLEXPEN  Inject 35 Units into the skin 3 (three) times daily with meals.     LANTUS SOLOSTAR 100 UNIT/ML Solostar Pen  Generic drug:  Insulin Glargine  INJECT 65 UNITS INTO THE SKIN 2 TIMES DAILY     levothyroxine 50 MCG tablet  Commonly known as:  SYNTHROID, LEVOTHROID  Take 1 tablet (50 mcg total) by mouth daily before breakfast.     nitroGLYCERIN 0.4 MG SL tablet  Commonly known as:  NITROSTAT  Place 1 tablet (0.4 mg total) under the tongue every 5 (five) minutes as needed for chest pain.     pantoprazole 40 MG tablet  Commonly known as:  PROTONIX  Take 1 tablet (40 mg total) by mouth daily.     polyethylene glycol packet  Commonly known as:  MIRALAX / GLYCOLAX  Take 17 g by mouth daily as needed for  mild constipation or moderate constipation.     SURE COMFORT PEN NEEDLES 31G X 8 MM Misc  Generic drug:  Insulin Pen Needle  USE WITH BOTH INSULIN PENS TWICE DAILY (4 PER DAY)        Discharge Instructions: Please refer to Patient Instructions section of EMR for full  details.  Patient was counseled important signs and symptoms that should prompt return to medical care, changes in medications, dietary instructions, activity restrictions, and follow up appointments.   Follow-Up Appointments: Follow-up Information    Follow up with [facility].   Why:  They will send a nurse out to your home   Contact information:   [address] [place] [address] [phone]       Follow up with Clearance Coots, MD On 06/26/2015.   Specialty:  Family Medicine   Why:  Hospital follow-up appointment at 3:00pm   Contact information:   [place] [address] [phone]       Follow up with Donetta Potts, MD.   Specialty:  Nephrology   Why:  Call to schedule an appointment ASAP   Contact information:   [place] [id number] [phone]       Sela Hua, MD 06/22/2015, 6:05 PM PGY-1, [place]

## 2015-06-19 NOTE — Progress Notes (Signed)
Family Medicine Teaching Service Daily Progress Note Intern Pager: [phone]  [name] Medical record number: [id number] Date of birth: Sep 28, 1950 Age: 65 y.o. Gender: male  Primary Care Provider: Clearance Coots, MD Consultants: None Code Status: Full  Pt Overview and Major Events to Date:  5/15: Admit to FMTS for HTN emergency and ACS rule out  Assessment and Plan: Leonard [name] is a 65 y.o. male presenting with HTN Emergency . PMH is significant for uncontrolled HTN 2/2 Medication Noncompliance, HFpEF, CAD s/p 3V CABG in 04/2013, PAD, HLD, Tobacco Abuse s/p Cessation, CKD IIIB, MGUS (resolved), Anemia CKD, COPD, DMII.   Hypertensive Emergency, improving - Likely secondary to med non-compliance with rebound HTN from Clonidine. BPs to 204/81 on admission, trending down to 174/72 this morning.  - Coreg 25mg  bid restarted yesterday. Will restart Norvasc 10mg  daily today. Pt has not required any prn Labetalol since admission.  - Would like to stop Hydralazine and Clonidine, as Pt has a hard time taking TID meds; however, options are limited in the setting of CKD. - PT/OT consult pending  ACS- Ruled out - Known CAD s/p CABG 04/2013, ECHO two weeks ago with preserved EF and no focal wall motion abnormalities. BPs in the 200s likely contributing. EKGunchanged this morning with no ST elevations/depressions. Troponins negative x 3. Chest pain resolved. - Will continue to monitor  Acute on chronic CKD III B - Acute injury likely secondary to elevated BPs. Cr bump to 3.15 on admission, trending down to 2.80 this morning. Baseline around 2.5-2.7 this year. K 4.0. UOP 3.4L in the last 24 hours. FENa 3.78, consistent with intrinsic renal disease. - Renal ultrasound showing left renal cortical irregularity, scarring cannot be excluded. 2.5cm simple cyst in lower pole of left kidney. No hydronephrosis or bladder distention. - Monitor I/O's closely. - Will get UA to look for  protein and casts. - Will need Nephrology outpatient f/u on discharge.  Elevated TSH: TSH 12.559. Has hypothyroidism listed on his problem list but not currently taking any medications. - T3 and T4 pending - Will start Synthroid 70mcg today.  CHFpEF - EF 60%, G2DD, LE edema is present. Supposed to be taking Lasix 60mg  bid, but not taking. Likely worsening diastolic function 2/2 uncontrolled HTN. BNP 229 down from 400's at last admission.  - S/p Lasix 60mg  IV x 1 in the ED - Will restart Lasix at 80mg  bid.  CAD s/p CABG x 3 04/2013 / PAD / HLD - Hypertriglyceridemia (2/2 uncontrolled DMII), LDL > 70 on 04/2015 lipid panel.  - Continue Lipitor and Coreg - Continue ASA here   DMII - poorly controlled. A1C 10.5% at last check. CBGs ranging from 205-279 since admission. - Lantus 45 units bid and Resistant SSI - Carb modified diet.   COPD - Albuterol prn  Microcytic anemia. Hgb is up from baseline. Iron deficiency 06/04/2015. Likely iron deficiency on AOCKD. Hgb 9.1 > 8.4 this AM. - Not on Iron as an outpatient.    FEN/GI: Carb Modified / HH diet, Protonix Prophylaxis: Heparin sub q  Disposition: Home pending improvement in BPs. Will follow-up on PT/OT recs.  Subjective:  Pt doing well this morning. Chest pain has resolved. No concerns.  Objective: Temp:  [97.6 F (36.4 C)-99.3 F (37.4 C)] 97.6 F (36.4 C) (05/16 0529) Pulse Rate:  [66-77] 68 (05/16 0529) Resp:  [18-26] 20 (05/16 0529) BP: (174-204)/(69-81) 174/72 mmHg (05/16 0529) SpO2:  [92 %-95 %] 94 % (05/16 0529) Weight:  [id number]  lb (102.967 kg)-248 lb 3 oz (112.577 kg)] 227 lb (102.967 kg) (05/16 0529) Physical Exam: General: NAD, resting comfortably HEENT: [place]/AT, EOMI, MMM Neck: FROM, Supple Cardiovascular: RRR, no MGR, 2+ distal pulses Respiratory: CTAB. Normal work of breathing. Abdomen: +BS, soft, NT, mild distention.  MSK: 3+ edema of BL LE to the mid-thigh, amputation of R fifth digit, otherwise no ulcerations or  abnormalities noted.  Skin: No rashes, no lesions Neuro: Grossly intact.  Psych: Appropriate mood / affect.   Laboratory:  Recent Labs Lab 06/18/15 1216 06/19/15 0350  WBC 8.2 7.5  HGB 9.1* 8.4*  HCT 29.1* 28.0*  PLT 164 152    Recent Labs Lab 06/18/15 1216 06/18/15 1529 06/19/15 0350  NA 139  --  142  K 4.5  --  4.0  CL 103  --  103  CO2 24  --  27  BUN 38*  --  39*  CREATININE 3.15*  --  2.80*  CALCIUM 8.9  --  9.0  PROT  --  6.2* 6.3*  BILITOT  --  0.7 0.7  ALKPHOS  --  82 81  ALT  --  21 21  AST  --  23 18  GLUCOSE 289*  --  231*   Trop 0.03 x 3 TSH 12.559  Imaging/Diagnostic Tests: - EKG NSR, chronic/stable TWI in lateral leads, unchanged - CXR: vascular congestion - Renal ultrasound showing left renal cortical irregularity, scarring cannot be excluded. 2.5cm simple cyst in lower pole of left kidney. No hydronephrosis or bladder distention.  Sela Hua, MD 06/19/2015, 6:38 AM PGY-1, [place] Intern pager: [phone], text pages welcome

## 2015-06-19 NOTE — Clinical Social Work Placement (Signed)
   CLINICAL SOCIAL WORK PLACEMENT  NOTE  Date:  06/19/2015  Patient Details  Name: Leonard [name] MRN: [id number] Date of Birth: Mar 10, 1950  Clinical Social Work is seeking post-discharge placement for this patient at the [place] level of care (*CSW will initial, date and re-position this form in  chart as items are completed):  Yes   Patient/family provided with [facility] Work Department's list of facilities offering this level of care within the geographic area requested by the patient (or if unable, by the patient's family).  Yes   Patient/family informed of their freedom to choose among providers that offer the needed level of care, that participate in Medicare, Medicaid or managed care program needed by the patient, have an available bed and are willing to accept the patient.  Yes   Patient/family informed of [facility]'s ownership interest in [name] and [facility], as well as of the fact that they are under no obligation to receive care at these facilities.  PASRR submitted to EDS on       PASRR number received on       Existing PASRR number confirmed on 06/19/15     FL2 transmitted to all facilities in geographic area requested by pt/family on 06/19/15     FL2 transmitted to all facilities within larger geographic area on       Patient informed that his/her managed care company has contracts with or will negotiate with certain facilities, including the following:            Patient/family informed of bed offers received.  Patient chooses bed at       Physician recommends and patient chooses bed at      Patient to be transferred to   on  .  Patient to be transferred to facility by       Patient family notified on   of transfer.  Name of family member notified:        PHYSICIAN       Additional Comment:    _______________________________________________ Candie Chroman, LCSW 06/19/2015, 2:47 PM

## 2015-06-19 NOTE — Clinical Social Work Note (Signed)
Clinical Social Work Assessment  Patient Details  Name: Leonard [name] MRN: [id number] Date of Birth: [birth year]  Date of referral:  06/18/15               Reason for consult:  Facility Placement, Discharge Planning                Permission sought to share information with:  Facility Sport and exercise psychologist, Family Supports Permission granted to share information::  Yes, Verbal Permission Granted  Name::     Kandace Blitz  Agency::  SNF's  Relationship::  [employer]:  B: [phone], J: [phone]  Housing/Transportation Living arrangements for the past 2 months:  Single Family Home Source of Information:  Patient, Medical Team Patient Interpreter Needed:  None Criminal Activity/Legal Involvement Pertinent to Current Situation/Hospitalization:  No - Comment as needed Significant Relationships:  Siblings Lives with:  Siblings Do you feel safe going back to the place where you live?  Yes Need for family participation in patient care:  Yes (Comment)  Care giving concerns:  PT recommending home health PT but his Medicaid will not cover services.   Social Worker assessment / plan:  CSW met with patient. Family supports at bedside. CSW introduced role and explained that discharge planning would be discussed. CSW discussed home health recommendation from PT but that his insurance would not cover it. Patient willing to go to SNF. Family supports at bedside supportive. CSW provided SNF list and asked patient to look list over with family. Will come back later today to discuss SNF preferences. Patient wanted to know about home health following discharge from SNF. CSW told him that the staff at the SNF would handle that. No further concerns. CSW encouraged patient to contact CSW as needed. CSW will continue to follow patient and his family and facilitate discharge to SNF once medically stable.   Employment status:  Retired Forensic scientist:  Medicaid [place] PT  Recommendations:  Home with [place] / Referral to community resources:  [place]  Patient/Family's Response to care:  Patient and family agreeable to SNF placement following discharge. Patient's family supportive and involved in patient's care. Patient polite and appreciated social work intervention.  Patient/Family's Understanding of and Emotional Response to Diagnosis, Current Treatment, and Prognosis:  Patient and his family understanding of medical interventions and aware of PT recommendation for SNF once medically stable for discharge.  Emotional Assessment Appearance:  Appears stated age Attitude/Demeanor/Rapport:   (Pleasant) Affect (typically observed):  Accepting, Appropriate, Calm, Pleasant Orientation:  Oriented to Self, Oriented to Place, Oriented to  Time, Oriented to Situation Alcohol / Substance use:  Tobacco Use Psych involvement (Current and /or in the community):  No (Comment)  Discharge Needs  Concerns to be addressed:  Care Coordination Readmission within the last 30 days:  Yes Current discharge risk:  Dependent with Mobility Barriers to Discharge:  No Barriers Identified   Candie Chroman, LCSW 06/19/2015, 2:33 PM

## 2015-06-20 LAB — URINE MICROSCOPIC-ADD ON

## 2015-06-20 LAB — URINALYSIS, ROUTINE W REFLEX MICROSCOPIC
BILIRUBIN URINE: NEGATIVE
Glucose, UA: NEGATIVE mg/dL
KETONES UR: NEGATIVE mg/dL
NITRITE: NEGATIVE
PH: 7 (ref 5.0–8.0)
PROTEIN: 100 mg/dL — AB
Specific Gravity, Urine: 1.009 (ref 1.005–1.030)

## 2015-06-20 LAB — RENAL FUNCTION PANEL
Albumin: 3.2 g/dL — ABNORMAL LOW (ref 3.5–5.0)
Anion gap: 12 (ref 5–15)
BUN: 37 mg/dL — AB (ref 6–20)
CALCIUM: 8.9 mg/dL (ref 8.9–10.3)
CO2: 26 mmol/L (ref 22–32)
CREATININE: 2.67 mg/dL — AB (ref 0.61–1.24)
Chloride: 104 mmol/L (ref 101–111)
GFR calc non Af Amer: 24 mL/min — ABNORMAL LOW (ref >60)
GFR, EST AFRICAN AMERICAN: 28 mL/min — AB (ref >60)
Glucose, Bld: 177 mg/dL — ABNORMAL HIGH (ref 65–99)
Phosphorus: 4.3 mg/dL (ref 2.5–4.6)
Potassium: 3.8 mmol/L (ref 3.5–5.1)
SODIUM: 142 mmol/L (ref 135–145)

## 2015-06-20 LAB — T3: T3, Total: 114 ng/dL (ref 71–180)

## 2015-06-20 LAB — CBC
HCT: 27.8 % — ABNORMAL LOW (ref 39.0–52.0)
Hemoglobin: 8.4 g/dL — ABNORMAL LOW (ref 13.0–17.0)
MCH: 22.8 pg — AB (ref 26.0–34.0)
MCHC: 30.2 g/dL (ref 30.0–36.0)
MCV: 75.3 fL — ABNORMAL LOW (ref 78.0–100.0)
PLATELETS: 159 10*3/uL (ref 150–400)
RBC: 3.69 MIL/uL — AB (ref 4.22–5.81)
RDW: 20.6 % — AB (ref 11.5–15.5)
WBC: 7.3 10*3/uL (ref 4.0–10.5)

## 2015-06-20 LAB — GLUCOSE, CAPILLARY
GLUCOSE-CAPILLARY: 183 mg/dL — AB (ref 65–99)
GLUCOSE-CAPILLARY: 171 mg/dL — AB (ref 65–99)
GLUCOSE-CAPILLARY: 232 mg/dL — AB (ref 65–99)
Glucose-Capillary: 235 mg/dL — ABNORMAL HIGH (ref 65–99)

## 2015-06-20 LAB — T4: T4 TOTAL: 5.3 ug/dL (ref 4.5–12.0)

## 2015-06-20 MED ORDER — DARBEPOETIN ALFA 300 MCG/0.6ML IJ SOSY
300.0000 ug | PREFILLED_SYRINGE | Freq: Once | INTRAMUSCULAR | Status: AC
  Administered 2015-06-20: 300 ug via SUBCUTANEOUS
  Filled 2015-06-20: qty 0.6

## 2015-06-20 MED ORDER — CARVEDILOL 25 MG PO TABS: 25.0000 mg | ORAL_TABLET | Freq: Two times a day (BID) | ORAL | Status: DC

## 2015-06-20 MED ORDER — FOSFOMYCIN TROMETHAMINE 3 G PO PACK
3.0000 g | PACK | Freq: Once | ORAL | Status: AC
  Administered 2015-06-20: 3 g via ORAL
  Filled 2015-06-20: qty 3

## 2015-06-20 MED ORDER — INSULIN GLARGINE 100 UNIT/ML ~~LOC~~ SOLN
50.0000 [IU] | Freq: Two times a day (BID) | SUBCUTANEOUS | Status: DC
  Administered 2015-06-20 – 2015-06-22 (×4): 50 [IU] via SUBCUTANEOUS
  Filled 2015-06-20 (×5): qty 0.5

## 2015-06-20 MED ORDER — HYDRALAZINE HCL 50 MG PO TABS
50.0000 mg | ORAL_TABLET | Freq: Three times a day (TID) | ORAL | Status: DC
  Administered 2015-06-20 – 2015-06-22 (×6): 50 mg via ORAL
  Filled 2015-06-20 (×6): qty 1

## 2015-06-20 MED ORDER — ONDANSETRON 4 MG PO TBDP
4.0000 mg | ORAL_TABLET | Freq: Three times a day (TID) | ORAL | Status: DC | PRN
  Administered 2015-06-20: 4 mg via ORAL
  Filled 2015-06-20 (×2): qty 1

## 2015-06-20 NOTE — Progress Notes (Deleted)
Discharge orders received, Pt for discharge home today. IV d/c'd. D/c instructions and RX given with verbalized understanding. Family at bedside to assist patient with discharge. Staff bought pt downstairs via wheelchair. 06/20/15

## 2015-06-20 NOTE — Clinical Social Work Note (Addendum)
CSW met with patient to present bed offers. One of patient's sisters, Gillis Santa, at bedside. Patient's sister had a list of preferences. [facility] was on that list. Discussed that hospital liason, Harriet Pho from [employer] could come speak with them. Patient's sister wanted to speak with the sister that patient lives with first. CSW sent Harriet Pho a message with this information.  Dayton Scrape, [place] [phone]  Patient and family accepted bed offer at [facility].  Dayton Scrape, [place]

## 2015-06-20 NOTE — Progress Notes (Signed)
Family Medicine Teaching Service Daily Progress Note Intern Pager: [phone]  [name] Medical record number: [id number] Date of birth: 26-Sep-1950 Age: 65 y.o. Gender: male  Primary Care Provider: Clearance Coots, MD Consultants: None Code Status: Full  Pt Overview and Major Events to Date:  5/15: Admit to FMTS for HTN emergency and ACS rule out  Assessment and Plan: Leonard [name] is a 65 y.o. male presenting with accelerated hypertension. PMH is significant for uncontrolled HTN 2/2 Medication Noncompliance, HFpEF, CAD s/p 3V CABG in 04/2013, PAD, HLD, Tobacco Abuse s/p Cessation, CKD IIIB, MGUS (resolved), Anemia CKD, COPD, DMII.   Accelerated Hypertension - Likely secondary to med non-compliance with rebound HTN from Clonidine. BP 173/83 this morning. Has not required any prn Labetalol. - Currently on Coreg 25mg  bid, Norvasc 10mg  daily, and Hydralazine 25mg  tid. Holding home Clonidine. - May need to increase Hydralazine dose to 50mg  tid.   Urinary Tract Infection: UA significant for many bacteria, small Hgb, large leukocytes, negative nitrites, 0-5 squams, and TNTC WBCs.  - Will treat with Fosfomycin 3g PO x 1. - Urine culture ordered  Acute on chronic CKD III B - Acute injury likely secondary to elevated BPs. Cr bump to 3.15 on admission, trending down to 2.67 this morning. Baseline around 2.5-2.7 this year. UOP 3.1L in the last 24 hours. FENa 3.78, consistent with intrinsic renal disease. UA with small Hgb, 100 protein, and 6-30 RBCs. - Will need Nephrology outpatient f/u on discharge.  Subclinical Hypothyroidism: TSH 12.559. T3 114 (nl), T4 5.23 (nl). Has hypothyroidism listed on his problem list but not currently taking any medications.  - Continue Synthroid 37mcg today. - Will need TSH recheck in 3-4 weeks as an outpatient.  CHFpEF - EF 60%, G2DD, LE edema is present. Supposed to be taking Lasix 60mg  bid, but not taking. Likely worsening diastolic function  2/2 uncontrolled HTN. BNP 229 down from 400's at last admission. Weight 233lb this morning, down from 236lb yesterday. Dry weight around 230-240lb. UOP 3.1L in the last 24 hours. 2+ lower extremity edema, slightly improved from yesterday. No crackles. - Continue Lasix at 80mg  bid.  CAD s/p CABG x 3 04/2013 / PAD / HLD - Hypertriglyceridemia (2/2 uncontrolled DMII), LDL > 70 on 04/2015 lipid panel.  - Continue Lipitor, Coreg, and ASA  DMII - poorly controlled. A1C 10.5% at last check. CBGs ranging from 145-236 in the last 24 hours. Received 15 units of Novolog yesterday (4 units at breakfast, 7 units at lunch, 4 units at dinner) - Lantus 45 units bid and Resistant SSI - Carb modified diet.   COPD - Albuterol prn  Microcytic anemia. Hgb is up from baseline. Likely iron deficiency on AOCKD. Hgb 9.1 > 8.4 this AM. - Not on Iron as an outpatient.   FEN/GI: Carb Modified / HH diet, Protonix Prophylaxis: Heparin sub q  Disposition: Home pending improvement in BPs. SW working on nursing home placement.  Subjective:  Pt doing well this morning. No concerns. States he has had a "funny feeling" while urinating for the last few days.  Objective: Temp:  [97.1 F (36.2 C)-98.2 F (36.8 C)] 97.9 F (36.6 C) (05/17 0500) Pulse Rate:  [67-76] 72 (05/17 0500) Resp:  [17-20] 20 (05/17 0500) BP: (172-183)/(65-83) 173/83 mmHg (05/17 0500) SpO2:  [97 %-99 %] 99 % (05/17 0500) Weight:  [233 lb 3.2 oz (105.779 kg)-236 lb 6.4 oz (107.23 kg)] 233 lb 3.2 oz (105.779 kg) (05/17 0500) Physical Exam: General: NAD, resting  comfortably HEENT: [place]/AT, EOMI, MMM Neck: FROM, Supple Cardiovascular: RRR, no MGR, 2+ distal pulses Respiratory: CTAB. Normal work of breathing. Abdomen: +BS, soft, NT, mild distention.  MSK: 2+ edema of BL LE to the mid-thigh, amputation of R fifth digit, otherwise no ulcerations or abnormalities noted.  Skin: No rashes, no lesions Neuro: Grossly intact.  Psych: Appropriate mood /  affect.   Laboratory:  Recent Labs Lab 06/18/15 1216 06/19/15 0350 06/20/15 0447  WBC 8.2 7.5 7.3  HGB 9.1* 8.4* 8.4*  HCT 29.1* 28.0* 27.8*  PLT 164 152 PENDING    Recent Labs Lab 06/18/15 1216 06/18/15 1529 06/19/15 0350 06/20/15 0458  NA 139  --  142 142  K 4.5  --  4.0 3.8  CL 103  --  103 104  CO2 24  --  27 26  BUN 38*  --  39* 37*  CREATININE 3.15*  --  2.80* 2.67*  CALCIUM 8.9  --  9.0 8.9  PROT  --  6.2* 6.3*  --   BILITOT  --  0.7 0.7  --   ALKPHOS  --  82 81  --   ALT  --  21 21  --   AST  --  23 18  --   GLUCOSE 289*  --  231* 177*   Trop 0.03 x 3 TSH 12.559, T3 114, T4 5.3 UA: Turbid, many bacteria, small Hgb, large LE, negative nitrites, 100 protein, 6-30 RBCs, 0-5 squams, TNTC WBCs  Imaging/Diagnostic Tests: - EKG NSR, chronic/stable TWI in lateral leads, unchanged - CXR: vascular congestion - Renal ultrasound showing left renal cortical irregularity, scarring cannot be excluded. 2.5cm simple cyst in lower pole of left kidney. No hydronephrosis or bladder distention.  Sela Hua, MD 06/20/2015, 6:49 AM PGY-1, [place] Intern pager: [phone], text pages welcome

## 2015-06-20 NOTE — Progress Notes (Signed)
PT Cancellation Note  Patient Details Name: Leonard [name] MRN: [id number] DOB: 1950/07/07   Cancelled Treatment:    Reason Eval/Treat Not Completed: Fatigue/lethargy limiting ability to participate (pt sleeping soundly, did not arouse to verbal stimulii, will follow. )   Philomena Doheny 06/20/2015, 11:07 AM [phone]

## 2015-06-21 ENCOUNTER — Ambulatory Visit: Payer: Medicaid Other

## 2015-06-21 ENCOUNTER — Other Ambulatory Visit: Payer: Medicaid Other

## 2015-06-21 LAB — BASIC METABOLIC PANEL
Anion gap: 13 (ref 5–15)
BUN: 42 mg/dL — AB (ref 6–20)
CALCIUM: 8.9 mg/dL (ref 8.9–10.3)
CO2: 26 mmol/L (ref 22–32)
CREATININE: 2.85 mg/dL — AB (ref 0.61–1.24)
Chloride: 101 mmol/L (ref 101–111)
GFR calc Af Amer: 26 mL/min — ABNORMAL LOW (ref >60)
GFR, EST NON AFRICAN AMERICAN: 22 mL/min — AB (ref >60)
GLUCOSE: 248 mg/dL — AB (ref 65–99)
POTASSIUM: 4 mmol/L (ref 3.5–5.1)
SODIUM: 140 mmol/L (ref 135–145)

## 2015-06-21 LAB — CBC
HCT: 29.6 % — ABNORMAL LOW (ref 39.0–52.0)
Hemoglobin: 9.1 g/dL — ABNORMAL LOW (ref 13.0–17.0)
MCH: 23.3 pg — AB (ref 26.0–34.0)
MCHC: 30.7 g/dL (ref 30.0–36.0)
MCV: 75.9 fL — AB (ref 78.0–100.0)
PLATELETS: 179 10*3/uL (ref 150–400)
RBC: 3.9 MIL/uL — ABNORMAL LOW (ref 4.22–5.81)
RDW: 20.4 % — AB (ref 11.5–15.5)
WBC: 6.9 10*3/uL (ref 4.0–10.5)

## 2015-06-21 LAB — GLUCOSE, CAPILLARY
GLUCOSE-CAPILLARY: 235 mg/dL — AB (ref 65–99)
Glucose-Capillary: 246 mg/dL — ABNORMAL HIGH (ref 65–99)
Glucose-Capillary: 189 mg/dL — ABNORMAL HIGH (ref 65–99)
Glucose-Capillary: 187 mg/dL — ABNORMAL HIGH (ref 65–99)

## 2015-06-21 MED ORDER — DOXAZOSIN MESYLATE 1 MG PO TABS
1.0000 mg | ORAL_TABLET | Freq: Every day | ORAL | Status: DC
  Administered 2015-06-21 – 2015-06-22 (×2): 1 mg via ORAL
  Filled 2015-06-21 (×2): qty 1

## 2015-06-21 MED ORDER — FUROSEMIDE 80 MG PO TABS
80.0000 mg | ORAL_TABLET | Freq: Every day | ORAL | Status: DC
  Administered 2015-06-22: 80 mg via ORAL
  Filled 2015-06-21: qty 1

## 2015-06-21 MED ORDER — INSULIN ASPART 100 UNIT/ML ~~LOC~~ SOLN
8.0000 [IU] | Freq: Three times a day (TID) | SUBCUTANEOUS | Status: DC
  Administered 2015-06-21 (×2): 8 [IU] via SUBCUTANEOUS

## 2015-06-21 MED ORDER — CARVEDILOL 25 MG PO TABS
50.0000 mg | ORAL_TABLET | Freq: Two times a day (BID) | ORAL | Status: DC
  Administered 2015-06-21 – 2015-06-22 (×4): 50 mg via ORAL
  Filled 2015-06-21 (×4): qty 2

## 2015-06-21 MED ORDER — DICLOFENAC SODIUM 1 % TD GEL
4.0000 g | Freq: Four times a day (QID) | TRANSDERMAL | Status: DC
  Administered 2015-06-21 – 2015-06-22 (×5): 4 g via TOPICAL
  Filled 2015-06-21: qty 100

## 2015-06-21 MED ORDER — INSULIN ASPART 100 UNIT/ML ~~LOC~~ SOLN
0.0000 [IU] | Freq: Three times a day (TID) | SUBCUTANEOUS | Status: DC
  Administered 2015-06-21: 5 [IU] via SUBCUTANEOUS
  Administered 2015-06-21 – 2015-06-22 (×3): 3 [IU] via SUBCUTANEOUS
  Administered 2015-06-22: 5 [IU] via SUBCUTANEOUS

## 2015-06-21 NOTE — Progress Notes (Signed)
Occupational Therapy Treatment Patient Details Name: Leonard [name] MRN: [id number] DOB: Jul 24, 1950 Today's Date: 06/21/2015    History of present illness Leonard [name] is a 65 y.o. male presenting with chest pain and dyspnea.  Troponin levels are stable.  Monitor BP.  Pt with CHF.    OT comments  Session broken into 3 small parts:  Had cramps start during first session and could not get relief.  Participated well in middle session and returned with AE and handout which I reviewed in last session (energy conservation)  Follow Up Recommendations  Home health OT;Supervision/Assistance - 24 hour    Equipment Recommendations  None recommended by OT    Recommendations for Other Services      Precautions / Restrictions Precautions Precautions: Fall Precaution Comments: Most recent fall was last October in his apartment       Mobility Bed Mobility Overal bed mobility: Modified Independent             General bed mobility comments: cues for safety when he was cramping as he was "hanging off the bed" trying to get relief  Transfers Overall transfer level: Needs assistance Equipment used: None Transfers: Sit to/from Stand Sit to Stand: Min guard         General transfer comment: for safety:  again, pt was in pain and was moving around trying to get relief    Balance                          ADL                       Lower Body Dressing: Minimal assistance;Sit to/from stand;Cueing for safety                 General ADL Comments: pt is now going home and has limited help.  He used to have an aide but no longer does.  Concerned about ADLs.  Issued AE for energy conservation and to make ADLs easier for him (reacher, sock aide, long sponge and shoehorn).  Pt practiced with reacher and sock aide.  Pt will benefit from further practice.  Discussed energy conservation and provided handout.  Pt verbalizes understanding      Vision                      Perception     Praxis      Cognition   Behavior During Therapy: WFL for tasks assessed/performed Overall Cognitive Status: Within Functional Limits for tasks assessed                       Extremity/Trunk Assessment               Exercises    Shoulder Instructions       General Comments      Pertinent Vitals/ Pain       Pain Assessment: Faces Faces Pain Scale: Hurts even more (second session "2") Pain Location: sides Pain Descriptors / Indicators: Cramping Pain Intervention(s): Limited activity within patient's tolerance;Monitored during session;Patient requesting pain meds-RN notified  Home Living                                          Prior Functioning/Environment  Frequency       Progress Toward Goals  OT Goals(current goals can now be found in the care plan section)  Progress towards OT goals: Progressing toward goals  Acute Rehab OT Goals Patient Stated Goal: have assistance at home  Plan      Co-evaluation                 End of Session     Activity Tolerance Patient tolerated treatment well (but seen in 3 parts:  once pain, practiced, then brought AE/)   Patient Left in bed;with call bell/phone within reach   Nurse Communication          Time: 1320-1320, 1448-1500 and 1507 - 1513 OT Time Calculation (min): 25 min  Charges: OT General Charges $OT Visit: 1 Procedure OT Treatments $Self Care/Home Management : 23-37 mins  Teyana Pierron 06/21/2015, 3:39 PM  Lesle Chris, OTR/L [phone] 06/21/2015

## 2015-06-21 NOTE — Progress Notes (Signed)
Family Medicine Teaching Service Daily Progress Note Intern Pager: [phone]  [name] Medical record number: [id number] Date of birth: 1950-05-18 Age: 65 y.o. Gender: male  Primary Care Provider: Clearance Coots, MD Consultants: None Code Status: Full  Pt Overview and Major Events to Date:  5/15: Admit to FMTS for HTN emergency and ACS rule out  Assessment and Plan: Leonard [name] is a 65 y.o. male presenting with accelerated hypertension. PMH is significant for uncontrolled HTN 2/2 Medication Noncompliance, HFpEF, CAD s/p 3V CABG in 04/2013, PAD, HLD, Tobacco Abuse s/p Cessation, CKD IIIB, MGUS (resolved), Anemia CKD, COPD, DMII.   Accelerated Hypertension, persistent - Likely secondary to med non-compliance with rebound HTN from Clonidine. BPs persistently elevated at 193/87 this morning. Has not required any prn Labetalol. HRs 80s. - Currently on Coreg 25mg  bid, Norvasc 10mg  daily, and Hydralazine 50mg  tid. Will increase Coreg to 50mg  bid. - Spoke with Dr. Marval Regal this morning. He recommends Doxazosin because it is a daily medication. He stated we could also try Captopril in the hospital and monitor his creatinine and switch to Lisinopril if he tolerates it. Would not recommend restarting Clonidine. - Will start Doxazosin 1mg  daily per Nephro recs  Urinary Tract Infection, treated: UA significant for many bacteria, small Hgb, large leukocytes, negative nitrites, 0-5 squams, and TNTC WBCs.  - s/p Fosfomycin 3g PO on 5/17. - Urine culture pending  Acute on chronic CKD III B - Acute injury likely secondary to elevated BPs. Cr bump to 3.15 on admission, trended down to 2.67 yesterday morning and bumped up to 2.85 this morning. Possibly secondary to restarting Lasix. Baseline around 2.5-2.7 this year. UOP 2.8L in the last 24 hours. FENa 3.78, consistent with intrinsic renal disease. UA with small Hgb, 100 protein, and 6-30 RBCs. - Will need Nephrology outpatient f/u on  discharge.  Subclinical Hypothyroidism: TSH 12.559. T3 114 (nl), T4 5.23 (nl). Has hypothyroidism listed on his problem list but not currently taking any medications.  - Continue Synthroid 75mcg. - Will need TSH recheck in 3-4 weeks as an outpatient.  CHFpEF - EF 60%, G2DD, LE edema is present. Supposed to be taking Lasix 60mg  bid, but not taking. Likely worsening diastolic function 2/2 uncontrolled HTN. BNP 229 down from 400's at last admission. Weights 236lb > 233lb > 225lb. Dry weight thought to be in the low 230s. UOP 2.6L in the last 24 hours. On exam, he only has trace edema in his lower extremities. - Will decrease Lasix from 80mg  bid to daily.  Microcytic anemia. Hgb is up from baseline. Likely iron deficiency on AOCKD. Hgb 8.4 > 9.1 this morning. - s/p Aranesp on 5/17.  CAD s/p CABG x 3 04/2013 / PAD / HLD - Hypertriglyceridemia (2/2 uncontrolled DMII), LDL > 70 on 04/2015 lipid panel.  - Continue Lipitor, Coreg, and ASA  DMII - Poorly controlled. A1C 10.5% at last check. CBGs ranging from 171-246 in the last 24 hours. Received 18 units of Novolog yesterday (4 units at breakfast, 7 units at lunch, 7 units at dinner, 7 at breakfast this am) - Lantus 50 units bid and Resistant SSI. Will add Novolog 8 units. - Carb modified diet.   COPD - Albuterol prn  FEN/GI: Carb Modified / HH diet, Protonix Prophylaxis: Heparin sub q  Disposition: Home pending improvement in BPs. Has a bed at [facility].  Subjective:  Pt states he is doing fine this morning. He denies any chest pain. No concerns.  Objective: Temp:  [  98.5 F (36.9 C)-98.8 F (37.1 C)] 98.5 F (36.9 C) (05/18 0540) Pulse Rate:  [70] 70 (05/17 1228) Resp:  [18-20] 20 (05/18 0540) BP: (161-187)/(75-85) 186/81 mmHg (05/18 0540) SpO2:  [98 %-100 %] 98 % (05/18 0540) Weight:  [225 lb 9.6 oz (102.331 kg)] 225 lb 9.6 oz (102.331 kg) (05/18 0540) Physical Exam: General: NAD, resting comfortably HEENT: [place]/AT, EOMI,  MMM Neck: FROM, Supple Cardiovascular: RRR, no MGR, 2+ distal pulses Respiratory: CTAB. Normal work of breathing. Abdomen: +BS, soft, NT, mild distention.  MSK: Trace edema in the lower extremities Skin: No rashes, no lesions Neuro: Grossly intact.  Psych: Appropriate mood / affect.   Laboratory:  Recent Labs Lab 06/19/15 0350 06/20/15 0447 06/21/15 0521  WBC 7.5 7.3 6.9  HGB 8.4* 8.4* 9.1*  HCT 28.0* 27.8* 29.6*  PLT 152 159 179    Recent Labs Lab 06/18/15 1529 06/19/15 0350 06/20/15 0458 06/21/15 0521  NA  --  142 142 140  K  --  4.0 3.8 4.0  CL  --  103 104 101  CO2  --  27 26 26   BUN  --  39* 37* 42*  CREATININE  --  2.80* 2.67* 2.85*  CALCIUM  --  9.0 8.9 8.9  PROT 6.2* 6.3*  --   --   BILITOT 0.7 0.7  --   --   ALKPHOS 82 81  --   --   ALT 21 21  --   --   AST 23 18  --   --   GLUCOSE  --  231* 177* 248*   Trop 0.03 x 3 TSH 12.559, T3 114, T4 5.3 UA: Turbid, many bacteria, small Hgb, large LE, negative nitrites, 100 protein, 6-30 RBCs, 0-5 squams, TNTC WBCs  Imaging/Diagnostic Tests: - EKG NSR, chronic/stable TWI in lateral leads, unchanged - CXR: vascular congestion - Renal ultrasound showing left renal cortical irregularity, scarring cannot be excluded. 2.5cm simple cyst in lower pole of left kidney. No hydronephrosis or bladder distention.  Sela Hua, MD 06/21/2015, 6:50 AM PGY-1, [place] Intern pager: [phone], text pages welcome

## 2015-06-21 NOTE — Care Management Note (Signed)
Case Management Note  Patient Details  Name: NABIL CURLESS MRN: [id number] Date of Birth: December 31, 1950  Subjective/Objective:         Admitted  With CHF           Action/Plan: Patient lives at home with his sister, has private insurance with Medicaid with prescription drug coverage; pharmacy of choice is [facility]. He use a walker at home to assist in his walking. Patient could benefit from a Disease Management Program for CHF; [place] choice offered and patient chose [place]; [place] with [facility] called for arrangements. Unable to offer HHPT /OT due to Medicaid will not cover the service without a qualifying diagnosis - patient is aware of this. Attending MD at discharge please enter the face to face in Epic for [facility] services.  Expected Discharge Date:    possibly 06/22/2015              Expected Discharge Plan:  [place]  In-House Referral:  Clinical Social Work  Discharge planning Services  CM Consult Choice offered to:  Patient  HH Arranged:  RN, Disease Management [place] Agency:  [place]  Status of Service:  In process, will continue to follow  Sherrilyn Rist [phone] 06/21/2015, 2:45 PM

## 2015-06-21 NOTE — Progress Notes (Signed)
Physical Therapy Treatment Patient Details Name: Leonard [name] MRN: [id number] DOB: 08-30-1950 Today's Date: 06/21/2015    History of Present Illness Leonard [name] is a 65 y.o. male presenting with chest pain and dyspnea.  Troponin levels are stable.  Monitor BP.  Pt with CHF.     PT Comments    Patient continues to report that he is exhausted when attempting to complete his ADLs. He walked 115 ft with RW and 4 standing rest breaks; no imbalance, HR stable and SaO2 97% on room air. Reviewed discharge plans, and although patient could benefit from HHPT, he can't afford to privately pay for it. Provided patient with a copy of a home exercise program and reviewed with him. Updated OT re: his concerns.   Follow Up Recommendations  Other (comment)  although patient could benefit from HHPT, he can't afford to privately pay for it     Equipment Recommendations  None recommended by PT    Recommendations for Other Services       Precautions / Restrictions Precautions Precautions: Fall Precaution Comments: Most recent fall was last October in his apartment    Mobility  Bed Mobility               General bed mobility comments: up in chair  Transfers Overall transfer level: Needs assistance Equipment used: Rolling walker (2 wheeled) Transfers: Sit to/from Stand Sit to Stand: Supervision         General transfer comment: hand placement correct and no imbalance; did catch wheel of RW on furniture as 5 ft from his chair and almost parked the walker to the sice; however he changed his mind without vc   Ambulation/Gait Ambulation/Gait assistance: Supervision Ambulation Distance (Feet): 115 Feet Assistive device: Rolling walker (2 wheeled) Gait Pattern/deviations: Step-through pattern;Decreased stride length   Gait velocity interpretation: Below normal speed for age/gender General Gait Details: supervision only due to patients frequent standing rest/stretch breaks; proper  use of RW (see transfer comments above); pt able to ambulate with head turns; no loss of balance throughout   Stairs            Wheelchair Mobility    Modified Rankin (Stroke Patients Only)       Balance Overall balance assessment: Needs assistance Sitting-balance support: No upper extremity supported;Feet supported Sitting balance-Leahy Scale: Good Sitting balance - Comments: reaching outside his BOS/stretching   Standing balance support: No upper extremity supported Standing balance-Leahy Scale: Good Standing balance comment: removed both hands from RW to stretch his sides twice while walking             High level balance activites: Direction changes;Turns;Sudden stops;Head turns High Level Balance Comments: no imbalance    Cognition Arousal/Alertness: Awake/alert Behavior During Therapy: WFL for tasks assessed/performed Overall Cognitive Status: Within Functional Limits for tasks assessed                      Exercises General Exercises - Lower Extremity Ankle Circles/Pumps:  (educated for further edema management) Other Exercises Other Exercises: Provided handout and reviewed HEP. Patient denied need for theraband, stating "it's not my strength, it's my stamina"    General Comments General comments (skin integrity, edema, etc.): Sisters present and discussed discharge plan (PT recommending HHPT, however pt cannot afford). When asked what about his day is most difficult he stated bathing and dressing wears him out and can hardly do anything afterward. OT scheduled to see pt for energy conservation and ADLs  Pertinent Vitals/Pain Pain Assessment: Faces Faces Pain Scale: Hurts even more Pain Location: Rt side  Pain Descriptors / Indicators: Cramping Pain Intervention(s): Limited activity within patient's tolerance;Monitored during session;Repositioned;Other (comment) (pt tried to stretch without relief)    Home Living                       Prior Function            PT Goals (current goals can now be found in the care plan section) Acute Rehab PT Goals Patient Stated Goal: go home. Time For Goal Achievement: 06/26/15 Progress towards PT goals: Progressing toward goals    Frequency  Min 3X/week    PT Plan Discharge plan needs to be updated    Co-evaluation             End of Session Equipment Utilized During Treatment: Gait belt Activity Tolerance: Patient tolerated treatment well (SaO2 and HR normal; good distance) Patient left: in chair;with call bell/phone within reach;with family/visitor present     Time: 1300-1319 PT Time Calculation (min) (ACUTE ONLY): 19 min  Charges:  $Therapeutic Activity: 8-22 mins                    G Codes:      Leonard [name] 2015/07/21, 1:40 PM  Pager [phone]

## 2015-06-21 NOTE — Progress Notes (Signed)
Received message from [place] with [facility] they do not availability for the patient to be seen at this time. Patient's second choice is [place]. Butch Penny with [facility] called for referral. Aneta Mins [phone]

## 2015-06-21 NOTE — Clinical Social Work Note (Addendum)
[facility] has rescinded their bed offer after patient and family accepted. Rehab does not think he will need therapy for up to 30 days. Medicaid will only pay for 30+ days. Called patient's sister, Ms. Vira Agar, and made her aware. Patient's sister will be at hospital around lunchtime. CSW will discuss back up plan with her and patient.  Dayton Scrape, CSW [phone]  [facility] willing to take patient on 2 week LOG for therapy. Spoke with Surveyor, quantity of social work about [place]. Based on therapy notes, patient will not get LOG and is able to return home.  Dayton Scrape, [place]

## 2015-06-21 NOTE — Clinical Social Work Note (Signed)
Medicaid will not pay for PT in SNF. Patient will return home with home health nurse and nurse aid. Patient and family aware.  CSW signing off. Consult if any other social work needs arise.  Dayton Scrape, [place]

## 2015-06-22 DIAGNOSIS — I10 Essential (primary) hypertension: Secondary | ICD-10-CM

## 2015-06-22 LAB — CBC
HCT: 29.2 % — ABNORMAL LOW (ref 39.0–52.0)
HEMOGLOBIN: 8.7 g/dL — AB (ref 13.0–17.0)
MCH: 22.5 pg — ABNORMAL LOW (ref 26.0–34.0)
MCHC: 29.8 g/dL — ABNORMAL LOW (ref 30.0–36.0)
MCV: 75.6 fL — AB (ref 78.0–100.0)
PLATELETS: 166 10*3/uL (ref 150–400)
RBC: 3.86 MIL/uL — AB (ref 4.22–5.81)
RDW: 20.1 % — ABNORMAL HIGH (ref 11.5–15.5)
WBC: 7.8 10*3/uL (ref 4.0–10.5)

## 2015-06-22 LAB — GLUCOSE, CAPILLARY
GLUCOSE-CAPILLARY: 200 mg/dL — AB (ref 65–99)
GLUCOSE-CAPILLARY: 174 mg/dL — AB (ref 65–99)
Glucose-Capillary: 236 mg/dL — ABNORMAL HIGH (ref 65–99)

## 2015-06-22 LAB — URINE CULTURE

## 2015-06-22 LAB — BASIC METABOLIC PANEL
ANION GAP: 13 (ref 5–15)
BUN: 39 mg/dL — ABNORMAL HIGH (ref 6–20)
CHLORIDE: 102 mmol/L (ref 101–111)
CO2: 26 mmol/L (ref 22–32)
Calcium: 8.7 mg/dL — ABNORMAL LOW (ref 8.9–10.3)
Creatinine, Ser: 2.88 mg/dL — ABNORMAL HIGH (ref 0.61–1.24)
GFR calc Af Amer: 25 mL/min — ABNORMAL LOW (ref >60)
GFR, EST NON AFRICAN AMERICAN: 22 mL/min — AB (ref >60)
GLUCOSE: 193 mg/dL — AB (ref 65–99)
POTASSIUM: 3.7 mmol/L (ref 3.5–5.1)
SODIUM: 141 mmol/L (ref 135–145)

## 2015-06-22 MED ORDER — LEVOTHYROXINE SODIUM 50 MCG PO TABS: 50.0000 ug | ORAL_TABLET | Freq: Every day | ORAL | Status: DC

## 2015-06-22 MED ORDER — FUROSEMIDE 40 MG PO TABS: 80.0000 mg | ORAL_TABLET | Freq: Two times a day (BID) | ORAL | Status: DC

## 2015-06-22 MED ORDER — INSULIN ASPART 100 UNIT/ML ~~LOC~~ SOLN
8.0000 [IU] | Freq: Three times a day (TID) | SUBCUTANEOUS | Status: DC
  Administered 2015-06-22: 8 [IU] via SUBCUTANEOUS

## 2015-06-22 MED ORDER — HYDRALAZINE HCL 50 MG PO TABS
75.0000 mg | ORAL_TABLET | Freq: Three times a day (TID) | ORAL | Status: DC
  Administered 2015-06-22: 75 mg via ORAL
  Filled 2015-06-22: qty 1

## 2015-06-22 MED ORDER — DOXAZOSIN MESYLATE 1 MG PO TABS: 1.0000 mg | ORAL_TABLET | Freq: Every day | ORAL | Status: DC

## 2015-06-22 MED ORDER — INSULIN ASPART 100 UNIT/ML ~~LOC~~ SOLN
12.0000 [IU] | Freq: Three times a day (TID) | SUBCUTANEOUS | Status: DC
  Administered 2015-06-22: 12 [IU] via SUBCUTANEOUS

## 2015-06-22 MED ORDER — HYDRALAZINE HCL 25 MG PO TABS: 75.0000 mg | ORAL_TABLET | Freq: Three times a day (TID) | ORAL | Status: DC

## 2015-06-22 NOTE — Progress Notes (Signed)
Pt has orders to be discharged. Discharge instructions given and pt has no additional questions at this time. Medication regimen reviewed and pt educated. Pt verbalized understanding and has no additional questions. Telemetry box removed. IV removed and site in good condition. Pt stable and waiting for transportation. 

## 2015-06-22 NOTE — Progress Notes (Signed)
Went to examine Leonard [name] today in light of recent blood pressure evaluation.   He has significant difference in Blood pressure between his ankles and his arms. Nearly 154mmhg.   O:  No palpable difference in pulses between femoral and radial arteries.  He does have bruits noted over both femoral arteries.  Otherwise his peripheral pulses in his radial arteries are 2+, in his ankles 1-  A/P: Considering possible thoracic aorta imaging for ? Coarctation with strikingly elevated BP's of UE vs LE. He has had ABI's in the past of 0.78 on the right side. Thought to be falsely elevated. They were monophasic in nature. Has had catheterization and intervention of SFA on right by vascular in the past as well.  - Currently will need repeat ABI's, Renal Artery U/S as an outpatient as further workup for HTN.  - Consider MRA of Thoracic Aorta as well.  - Discussed with attendign Dr. Ree Kida. Does not need to remain in the hospital for this workup.

## 2015-06-22 NOTE — Progress Notes (Signed)
Family Medicine Teaching Service Daily Progress Note Intern Pager: [phone]  [name] Medical record number: [id number] Date of birth: 1950/08/02 Age: 65 y.o. Gender: male  Primary Care Provider: Clearance Coots, MD Consultants: None Code Status: Full  Pt Overview and Major Events to Date:  5/15: Admit to FMTS for HTN emergency and ACS rule out  Assessment and Plan: Leonard [name] is a 65 y.o. male presenting with accelerated hypertension. PMH is significant for uncontrolled HTN 2/2 Medication Noncompliance, HFpEF, CAD s/p 3V CABG in 04/2013, PAD, HLD, Tobacco Abuse s/p Cessation, CKD IIIB, MGUS (resolved), Anemia CKD, COPD, DMII.   Accelerated Hypertension, persistent: Had some decreased BPs to 157/73, but BP was elevated to 186/73.Marland Kitchen Has not required any prn Labetalol.  - Currently on Coreg 50mg  bid, Norvasc 10mg  daily, Hydralazine 50mg  tid, Doxazosin 1mg  daily. Orthostatics positive. Pt denies dizziness or headaches.  - Will increase Hydralazine to 75mg  tid. - Monitor BPs closely  Urinary Tract Infection, treated: UA significant for many bacteria, small Hgb, large leukocytes, negative nitrites, 0-5 squams, and TNTC WBCs. Urine culture growing >100,000 colonies of E. Coli.  - s/p Fosfomycin 3g PO on 5/17. - Follow-up urine culture sensitivities  Acute on chronic CKD III B: Acute injury likely secondary to elevated BPs. Cr bump to 3.15 on admission, trended down to 2.67 on 5/17 and is now coming back up to 2.88. Possibly secondary to restarting Lasix. Baseline around 2.5-2.7 this year. UOP 1.9L in the last 24 hours. FENa 3.78, consistent with intrinsic renal disease. UA with small Hgb, 100 protein, and 6-30 RBCs. - Will need Nephrology outpatient f/u on discharge.  CHFpEF - EF 60%, G2DD, LE edema is present. Supposed to be taking Lasix 60mg  bid, but not taking. Likely worsening diastolic function 2/2 uncontrolled HTN. BNP 229 down from 400's at last admission. Weights  236lb > 233lb > 225lb > 226lb. Dry weight thought to be in the low 230s. UOP 1.9L in the last 24 hours. On exam, he only has trace edema in his lower extremities.  - Continue Lasix 80mg  daily  DMII - Poorly controlled. A1C 10.5% at last check. CBGs ranging from 187-235 in the last 24 hours. Received 11 units of Novolog by sliding scale yesterday (3 units at breakfast, 5 units at lunch, 3 units at dinner) - Continue Lantus 50 units bid  - Will increase Novolog from 8 units tid to 12 units tid. - Carb modified diet.   Subclinical Hypothyroidism: TSH 12.559. T3 114 (nl), T4 5.23 (nl). - Continue Synthroid 38mcg. - Will need TSH recheck in 3-4 weeks as an outpatient.  Microcytic anemia. Hgb is up from baseline. Likely iron deficiency on AOCKD. Hgb 8.4 > 9.1 > 8.7 this morning. - s/p Aranesp on 5/17.  CAD s/p CABG x 3 04/2013 / PAD / HLD - Hypertriglyceridemia (2/2 uncontrolled DMII), LDL > 70 on 04/2015 lipid panel.  - Continue Lipitor, Coreg, and ASA  COPD - Albuterol prn  FEN/GI: Carb Modified / HH diet, Protonix Prophylaxis: Heparin sub q  Disposition: Discharge pending improvement in BPs. Will go home with [place], [place] nurse, and [place] aide.  Subjective:  Pt doing well this morning. No dizziness, no headaches, no chest pain.   Objective: Temp:  [98.2 F (36.8 C)-98.7 F (37.1 C)] 98.3 F (36.8 C) (05/19 [id number]) Pulse Rate:  [71-79] 79 (05/19 0613) Resp:  [18-20] 18 (05/19 0613) BP: (157-193)/(73-87) 186/73 mmHg (05/19 0613) SpO2:  [93 %-96 %] 95 % (05/19 [id number])  Weight:  [226 lb 9.6 oz (102.785 kg)] 226 lb 9.6 oz (102.785 kg) (05/19 [id number]) Physical Exam: General: NAD, resting comfortably HEENT: [place]/AT, EOMI, MMM Neck: FROM, Supple Cardiovascular: RRR, no MGR, 2+ distal pulses Respiratory: CTAB. Normal work of breathing. Abdomen: +BS, soft, NT, mild distention.  MSK: Trace edema in the lower extremities Skin: No rashes, no lesions Neuro: Grossly intact.  Psych: Appropriate mood /  affect.   Laboratory:  Recent Labs Lab 06/20/15 0447 06/21/15 0521 06/22/15 0406  WBC 7.3 6.9 7.8  HGB 8.4* 9.1* 8.7*  HCT 27.8* 29.6* 29.2*  PLT 159 179 166    Recent Labs Lab 06/18/15 1529 06/19/15 0350 06/20/15 0458 06/21/15 0521 06/22/15 0406  NA  --  142 142 140 141  K  --  4.0 3.8 4.0 3.7  CL  --  103 104 101 102  CO2  --  27 26 26 26   BUN  --  39* 37* 42* 39*  CREATININE  --  2.80* 2.67* 2.85* 2.88*  CALCIUM  --  9.0 8.9 8.9 8.7*  PROT 6.2* 6.3*  --   --   --   BILITOT 0.7 0.7  --   --   --   ALKPHOS 82 81  --   --   --   ALT 21 21  --   --   --   AST 23 18  --   --   --   GLUCOSE  --  231* 177* 248* 193*   Trop 0.03 x 3 TSH 12.559, T3 114, T4 5.3 UA: Turbid, many bacteria, small Hgb, large LE, negative nitrites, 100 protein, 6-30 RBCs, 0-5 squams, TNTC WBCs Urine culture: >100,000 E. coli  Imaging/Diagnostic Tests: - EKG NSR, chronic/stable TWI in lateral leads, unchanged - CXR: vascular congestion - Renal ultrasound showing left renal cortical irregularity, scarring cannot be excluded. 2.5cm simple cyst in lower pole of left kidney. No hydronephrosis or bladder distention.  Sela Hua, MD 06/22/2015, 7:06 AM PGY-1, [place] Intern pager: [phone], text pages welcome

## 2015-06-22 NOTE — Discharge Instructions (Signed)
HAPPY BIRTHDAY!!!  You were hospitalized because your blood pressures were very high. We have changed the doses of some of your blood pressure medications and we have added a new medication. You should be taking the following blood pressure medications: 1. Coreg 50mg  twice a day 2. Norvasc 10mg  daily 3. Hydralazine 75mg  three times a day 4. Doxazosin 1mg  daily  Please take Lasix 80mg  daily.  Your thyroid level was low. We started you on a medication called Synthroid. Please take Synthroid 57mcg daily.  If you have any questions or concerns, please do not hesitate to call our office at [phone].

## 2015-06-22 NOTE — Progress Notes (Signed)
Pt has orders to be discharged. Discharge instructions given and pt has no additional questions at this time. Medication regimen reviewed and pt and family educated. Family had concerns about patient's discharge because he has trouble walking at times. Explained to family that [facility] will call patient's home within 24 hrs to set up home health. MD notified to speak with family. Family now understands that patient does not qualify for rehab facility at this time and support given. Telemetry box removed. IV removed and site in good condition. Pt stable and waiting for transportation.

## 2015-06-22 NOTE — Progress Notes (Signed)
Occupational Therapy Treatment Patient Details Name: Leonard [name] MRN: [id number] DOB: 1950/04/05 Today's Date: 06/22/2015    History of present illness Leonard [name] is a 65 y.o. male presenting with chest pain and dyspnea.  Troponin levels are stable.  Monitor BP.  Pt with CHF.    OT comments  Pt progressing overall towards acute OT goals. Limited by 7/10 right knee pain. Goal of session was toilet and tub transfers. Ultimately pt performed 2 sit<>stands at EOB min guard level. Pt stood only briefly and after second attempt declined OOB mobility. Reviewed energy conservation, AE, and fall prevention for safe completion of ADLs at home. D/c plan remains appropriate.   Follow Up Recommendations  Home health OT;Supervision/Assistance - 24 hour    Equipment Recommendations  None recommended by OT    Recommendations for Other Services      Precautions / Restrictions Precautions Precautions: Fall Precaution Comments: Most recent fall was last October in his apartment Restrictions Weight Bearing Restrictions: No       Mobility Bed Mobility Overal bed mobility: Modified Independent                Transfers Overall transfer level: Needs assistance Equipment used: Rolling walker (2 wheeled) Transfers: Sit to/from Stand Sit to Stand: Min guard         General transfer comment: 2x from EOB. stood briefly then sat down quickly both times citing right knee pain.     Balance Overall balance assessment: Needs assistance Sitting-balance support: Feet supported;No upper extremity supported Sitting balance-Leahy Scale: Good Sitting balance - Comments: adjusted socks EOB   Standing balance support: Bilateral upper extremity supported Standing balance-Leahy Scale: Fair Standing balance comment: limited by right knee pain, sitting down quickly upon standing 2x                   ADL Overall ADL's : Needs assistance/impaired                       Lower  Body Dressing Details (indicate cue type and reason): adjusted both socks in sitting position. likely still min A for full LB dressing               General ADL Comments: Attempted to practice toiltet and tub transfers with pt limited by 7/10 pain in right knee and declining OOB mobility. Reviewed energy conservation, AE, and fall prevention education.       Vision                     Perception     Praxis      Cognition   Behavior During Therapy: WFL for tasks assessed/performed Overall Cognitive Status: Within Functional Limits for tasks assessed                       Extremity/Trunk Assessment               Exercises Other Exercises Other Exercises: Reviewed importance of BUE strengthening exercises    Shoulder Instructions       General Comments      Pertinent Vitals/ Pain       Pain Assessment: 0-10 Pain Score: 7  Pain Location: right knee, increases with WB Pain Descriptors / Indicators: Aching;Sore;Grimacing;Moaning Pain Intervention(s): Limited activity within patient's tolerance;Monitored during session;Repositioned  Home Living  Prior Functioning/Environment              Frequency Min 2X/week     Progress Toward Goals  OT Goals(current goals can now be found in the care plan section)  Progress towards OT goals: Progressing toward goals  Acute Rehab OT Goals Patient Stated Goal: have assistance at home OT Goal Formulation: With patient Time For Goal Achievement: 07/03/15 Potential to Achieve Goals: Good ADL Goals Pt Will Perform Grooming: with modified independence;standing Pt Will Perform Lower Body Bathing: with modified independence;sit to/from stand Pt Will Perform Lower Body Dressing: with modified independence;sit to/from stand Pt Will Transfer to Toilet: with modified independence;regular height toilet Pt Will Perform Toileting - Clothing Manipulation  and hygiene: with modified independence;sit to/from stand Pt Will Perform Tub/Shower Transfer: Tub transfer;shower seat;ambulating;rolling walker Additional ADL Goal #1: Pt will state 3 things that he will do at home to conserve energy during adls with no cues. Additional ADL Goal #2: Pt will walk to bathroom and toilet with mod I on elevated commode seat.  Plan Discharge plan remains appropriate    Co-evaluation                 End of Session Equipment Utilized During Treatment: Rolling walker   Activity Tolerance Patient limited by pain   Patient Left in bed;with call bell/phone within reach;with bed alarm set   Nurse Communication          Time: [id number] OT Time Calculation (min): 12 min  Charges: OT General Charges $OT Visit: 1 Procedure OT Treatments $Self Care/Home Management : 8-22 mins  Hortencia Pilar 06/22/2015, 11:54 AM

## 2015-06-25 ENCOUNTER — Other Ambulatory Visit: Payer: Self-pay | Admitting: *Deleted

## 2015-06-25 ENCOUNTER — Other Ambulatory Visit: Payer: Self-pay | Admitting: Internal Medicine

## 2015-06-25 NOTE — Telephone Encounter (Signed)
Patient needs refill Lantus Solastar pen.  Pharmacy would like to increase quantity to #2 vials for 24 day supply.  Would also like updated med list since patient was recently discharged from hospital last week.  Will route refill request to PCP.  Unable to attach Lantus to refill encounter--Will have PCP update DM diagnosis in Epic.  Will send updated med list after Lantus refill has been approved.  Burna Forts, BSN, RN-BC

## 2015-06-26 ENCOUNTER — Ambulatory Visit (INDEPENDENT_AMBULATORY_CARE_PROVIDER_SITE_OTHER): Payer: Medicaid Other | Admitting: Family Medicine

## 2015-06-26 ENCOUNTER — Encounter: Payer: Self-pay | Admitting: Family Medicine

## 2015-06-26 ENCOUNTER — Other Ambulatory Visit: Payer: Self-pay | Admitting: *Deleted

## 2015-06-26 VITALS — BP 176/63 | HR 78 | Temp 98.2°F | Ht 68.0 in | Wt 238.2 lb

## 2015-06-26 DIAGNOSIS — I10 Essential (primary) hypertension: Secondary | ICD-10-CM

## 2015-06-26 DIAGNOSIS — R319 Hematuria, unspecified: Secondary | ICD-10-CM

## 2015-06-26 DIAGNOSIS — R079 Chest pain, unspecified: Secondary | ICD-10-CM | POA: Diagnosis not present

## 2015-06-26 DIAGNOSIS — I2510 Atherosclerotic heart disease of native coronary artery without angina pectoris: Secondary | ICD-10-CM | POA: Diagnosis not present

## 2015-06-26 LAB — POCT URINALYSIS DIPSTICK
Bilirubin, UA: NEGATIVE
Blood, UA: NEGATIVE
GLUCOSE UA: NEGATIVE
KETONES UA: NEGATIVE
LEUKOCYTES UA: NEGATIVE
Nitrite, UA: NEGATIVE
SPEC GRAV UA: 1.02
Urobilinogen, UA: 1
pH, UA: 5.5

## 2015-06-26 LAB — POCT UA - MICROSCOPIC ONLY

## 2015-06-26 MED ORDER — INSULIN GLARGINE 100 UNIT/ML SOLOSTAR PEN: PEN_INJECTOR | SUBCUTANEOUS | Status: DC

## 2015-06-26 MED ORDER — POLYETHYLENE GLYCOL 3350 17 G PO PACK: 17.0000 g | PACK | Freq: Every day | ORAL | Status: DC | PRN

## 2015-06-26 NOTE — Progress Notes (Signed)
   Subjective:    Leonard [name] - 65 y.o. male MRN [id number]  Date of birth: 1950-05-13  CC chest pain  HPI  Leonard [name] is here for chest pain.  Patient was admitted for chest pain, hypertensive emergency and AKI. He has had a nurse come to his residence two times thus far. Have not seen the nurse's aid. His blood pressure at home was 160/90. He has a discomfort in the central of his chest from time to time. He has an appointment with the Cardiologist on Thursday. He hasn't used nitro since he has been discharged. Described his chest pain as not feeling normal. He has pain in the back of his calves from time to time. He hasn't contacted his nerphologist yet.   SH: quit smoking in 2015 PMH: HTN, CAD, COPD, diastolic HF grade 2 DD, MGUS, HLD   Health Maintenance:  - eye exam scheduled for next week.  Health Maintenance Due  Topic Date Due  . COLONOSCOPY  06/21/2001  . ZOSTAVAX  06/22/2011  . OPHTHALMOLOGY EXAM  02/04/2015    Review of Systems See HPI     Objective:   Physical Exam BP 176/63 mmHg  Pulse 78  Temp(Src) 98.2 F (36.8 C) (Oral)  Ht 5\' 8"  (1.727 m)  Wt 238 lb 3.2 oz (108.047 kg)  BMI 36.23 kg/m2 Gen: NAD, alert, cooperative with exam,  HEENT: NCAT, EOMI, clear conjunctiva,  CV: RRR, good S1/S2, no murmur, +1 edema bilaterally Resp: CTABL, no wheezes, non-labored Skin: no rashes, normal turgor  Neuro: no gross deficits.     Assessment & Plan:   Resistant hypertension His ABI's conducted while inpatient were markedly different and there was recommendation to pursue repeat ABI's vs thoracic MRI  - will place referral to vascular for evaluation.   Hematuria Different urinalysis results have displayed blood.  - repeat UA today is negative for blood - may need to consider urology referral in the future   Chest pain Recently discharged  Less likely for cardiac given ACS r/o.  Possible for MSK vs GERD Having discomfort from time to time.  Has not  used nitro  Has follow up with Cardiologist on Thursday Given indications for return.

## 2015-06-26 NOTE — Telephone Encounter (Signed)
Refilled lantus.   Rosemarie Ax, MD PGY-3, [facility] 06/26/2015, 12:04 PM

## 2015-06-26 NOTE — Patient Instructions (Signed)
Thank you for coming in,   Please call Dr. Marval Regal (Kidney doctor) to schedule an appointment.   I have referred you to a vascular doctor and you should be hearing from them soon.   Please follow up with me in two weeks.   Please bring all of your medications with you to each visit.   Health maintenance items that are due.  Health Maintenance  Topic Date Due  . Colon Cancer Screening  06/21/2001  . Shingles Vaccine  06/22/2011  . Eye exam for diabetics  02/04/2015  . Flu Shot  09/04/2015  . Complete foot exam   11/21/2015  . Hemoglobin A1C  12/03/2015  . Pneumococcal vaccine (2) 01/05/2018  . Tetanus Vaccine  11/20/2024  .  Hepatitis C: One time screening is recommended by Center for Disease Control  (CDC) for  adults born from [birth year] through 1964.   Completed  . HIV Screening  Completed     Sign up for My Chart to have easy access to your labs results, and communication with your Primary care physician   Please feel free to call with any questions or concerns at any time, at [phone]. --Dr. Raeford Razor

## 2015-06-27 ENCOUNTER — Telehealth: Payer: Self-pay | Admitting: Physician Assistant

## 2015-06-27 DIAGNOSIS — R319 Hematuria, unspecified: Secondary | ICD-10-CM

## 2015-06-27 HISTORY — DX: Hematuria, unspecified: R31.9

## 2015-06-27 NOTE — Assessment & Plan Note (Addendum)
Recently discharged  Less likely for cardiac given ACS r/o.  Possible for MSK vs GERD Having discomfort from time to time.  Has not used nitro  Has follow up with Cardiologist on Thursday Given indications for return.

## 2015-06-27 NOTE — Telephone Encounter (Signed)
Received records from [facility] for appointment on 06/28/15 with Tarri Fuller, PA.  Records given to [employer] (medical records) for Bryan's schedule on 06/28/15.

## 2015-06-27 NOTE — Assessment & Plan Note (Signed)
His ABI's conducted while inpatient were markedly different and there was recommendation to pursue repeat ABI's vs thoracic MRI  - will place referral to vascular for evaluation.

## 2015-06-27 NOTE — Assessment & Plan Note (Signed)
Different urinalysis results have displayed blood.  - repeat UA today is negative for blood - may need to consider urology referral in the future

## 2015-06-28 ENCOUNTER — Other Ambulatory Visit: Payer: Self-pay

## 2015-06-28 ENCOUNTER — Ambulatory Visit: Payer: Medicaid Other | Admitting: Physician Assistant

## 2015-06-28 ENCOUNTER — Telehealth: Payer: Self-pay | Admitting: Licensed Clinical Social Worker

## 2015-06-28 ENCOUNTER — Emergency Department (HOSPITAL_COMMUNITY): Payer: Medicaid Other

## 2015-06-28 ENCOUNTER — Encounter (HOSPITAL_COMMUNITY): Payer: Self-pay | Admitting: *Deleted

## 2015-06-28 ENCOUNTER — Inpatient Hospital Stay (HOSPITAL_COMMUNITY)
Admission: EM | Admit: 2015-06-28 | Discharge: 2015-07-02 | DRG: 291 | Disposition: A | Discharge status: Home Health | Payer: Medicaid Other | Attending: Family Medicine | Admitting: Family Medicine

## 2015-06-28 ENCOUNTER — Encounter: Payer: Self-pay | Admitting: Licensed Clinical Social Worker

## 2015-06-28 DIAGNOSIS — Z95820 Peripheral vascular angioplasty status with implants and grafts: Secondary | ICD-10-CM | POA: Diagnosis not present

## 2015-06-28 DIAGNOSIS — I159 Secondary hypertension, unspecified: Secondary | ICD-10-CM | POA: Insufficient documentation

## 2015-06-28 DIAGNOSIS — D509 Iron deficiency anemia, unspecified: Secondary | ICD-10-CM | POA: Diagnosis present

## 2015-06-28 DIAGNOSIS — J438 Other emphysema: Secondary | ICD-10-CM | POA: Diagnosis not present

## 2015-06-28 DIAGNOSIS — Z794 Long term (current) use of insulin: Secondary | ICD-10-CM | POA: Diagnosis not present

## 2015-06-28 DIAGNOSIS — I509 Heart failure, unspecified: Secondary | ICD-10-CM

## 2015-06-28 DIAGNOSIS — Z9119 Patient's noncompliance with other medical treatment and regimen: Secondary | ICD-10-CM | POA: Diagnosis not present

## 2015-06-28 DIAGNOSIS — E1165 Type 2 diabetes mellitus with hyperglycemia: Secondary | ICD-10-CM | POA: Diagnosis present

## 2015-06-28 DIAGNOSIS — E1122 Type 2 diabetes mellitus with diabetic chronic kidney disease: Secondary | ICD-10-CM | POA: Diagnosis present

## 2015-06-28 DIAGNOSIS — Z8249 Family history of ischemic heart disease and other diseases of the circulatory system: Secondary | ICD-10-CM

## 2015-06-28 DIAGNOSIS — I25768 Atherosclerosis of bypass graft of coronary artery of transplanted heart with other forms of angina pectoris: Secondary | ICD-10-CM | POA: Diagnosis not present

## 2015-06-28 DIAGNOSIS — D631 Anemia in chronic kidney disease: Secondary | ICD-10-CM | POA: Diagnosis present

## 2015-06-28 DIAGNOSIS — I10 Essential (primary) hypertension: Secondary | ICD-10-CM

## 2015-06-28 DIAGNOSIS — Z951 Presence of aortocoronary bypass graft: Secondary | ICD-10-CM | POA: Diagnosis not present

## 2015-06-28 DIAGNOSIS — N189 Chronic kidney disease, unspecified: Secondary | ICD-10-CM | POA: Diagnosis not present

## 2015-06-28 DIAGNOSIS — I130 Hypertensive heart and chronic kidney disease with heart failure and stage 1 through stage 4 chronic kidney disease, or unspecified chronic kidney disease: Secondary | ICD-10-CM | POA: Diagnosis present

## 2015-06-28 DIAGNOSIS — I5033 Acute on chronic diastolic (congestive) heart failure: Secondary | ICD-10-CM | POA: Diagnosis present

## 2015-06-28 DIAGNOSIS — R635 Abnormal weight gain: Secondary | ICD-10-CM | POA: Diagnosis not present

## 2015-06-28 DIAGNOSIS — I2510 Atherosclerotic heart disease of native coronary artery without angina pectoris: Secondary | ICD-10-CM | POA: Diagnosis present

## 2015-06-28 DIAGNOSIS — I5032 Chronic diastolic (congestive) heart failure: Secondary | ICD-10-CM | POA: Diagnosis not present

## 2015-06-28 DIAGNOSIS — Z7982 Long term (current) use of aspirin: Secondary | ICD-10-CM

## 2015-06-28 DIAGNOSIS — I252 Old myocardial infarction: Secondary | ICD-10-CM

## 2015-06-28 DIAGNOSIS — I160 Hypertensive urgency: Secondary | ICD-10-CM | POA: Diagnosis present

## 2015-06-28 DIAGNOSIS — Z87891 Personal history of nicotine dependence: Secondary | ICD-10-CM

## 2015-06-28 DIAGNOSIS — E039 Hypothyroidism, unspecified: Secondary | ICD-10-CM | POA: Diagnosis present

## 2015-06-28 DIAGNOSIS — N183 Chronic kidney disease, stage 3 (moderate): Secondary | ICD-10-CM | POA: Diagnosis present

## 2015-06-28 DIAGNOSIS — E782 Mixed hyperlipidemia: Secondary | ICD-10-CM | POA: Diagnosis present

## 2015-06-28 DIAGNOSIS — G4733 Obstructive sleep apnea (adult) (pediatric): Secondary | ICD-10-CM | POA: Diagnosis present

## 2015-06-28 DIAGNOSIS — I739 Peripheral vascular disease, unspecified: Secondary | ICD-10-CM

## 2015-06-28 DIAGNOSIS — D649 Anemia, unspecified: Secondary | ICD-10-CM | POA: Diagnosis not present

## 2015-06-28 DIAGNOSIS — E1151 Type 2 diabetes mellitus with diabetic peripheral angiopathy without gangrene: Secondary | ICD-10-CM | POA: Diagnosis present

## 2015-06-28 DIAGNOSIS — E118 Type 2 diabetes mellitus with unspecified complications: Secondary | ICD-10-CM | POA: Insufficient documentation

## 2015-06-28 DIAGNOSIS — E038 Other specified hypothyroidism: Secondary | ICD-10-CM | POA: Diagnosis not present

## 2015-06-28 DIAGNOSIS — J449 Chronic obstructive pulmonary disease, unspecified: Secondary | ICD-10-CM | POA: Diagnosis present

## 2015-06-28 LAB — CBC WITH DIFFERENTIAL/PLATELET
Basophils Absolute: 0 10*3/uL (ref 0.0–0.1)
Basophils Relative: 0 %
EOS ABS: 0.3 10*3/uL (ref 0.0–0.7)
Eosinophils Relative: 4 %
HCT: 25.4 % — ABNORMAL LOW (ref 39.0–52.0)
HEMOGLOBIN: 7.7 g/dL — AB (ref 13.0–17.0)
LYMPHS ABS: 1 10*3/uL (ref 0.7–4.0)
LYMPHS PCT: 10 %
MCH: 22.8 pg — AB (ref 26.0–34.0)
MCHC: 30.3 g/dL (ref 30.0–36.0)
MCV: 75.4 fL — ABNORMAL LOW (ref 78.0–100.0)
Monocytes Absolute: 0.9 10*3/uL (ref 0.1–1.0)
Monocytes Relative: 10 %
NEUTROS ABS: 7.5 10*3/uL (ref 1.7–7.7)
NEUTROS PCT: 76 %
Platelets: 189 10*3/uL (ref 150–400)
RBC: 3.37 MIL/uL — AB (ref 4.22–5.81)
RDW: 19.7 % — ABNORMAL HIGH (ref 11.5–15.5)
WBC: 9.7 10*3/uL (ref 4.0–10.5)

## 2015-06-28 LAB — BASIC METABOLIC PANEL
ANION GAP: 11 (ref 5–15)
Anion gap: 13 (ref 5–15)
BUN: 50 mg/dL — ABNORMAL HIGH (ref 6–20)
BUN: 53 mg/dL — AB (ref 6–20)
CHLORIDE: 101 mmol/L (ref 101–111)
CHLORIDE: 100 mmol/L — AB (ref 101–111)
CO2: 22 mmol/L (ref 22–32)
CO2: 20 mmol/L — ABNORMAL LOW (ref 22–32)
Calcium: 8.6 mg/dL — ABNORMAL LOW (ref 8.9–10.3)
Calcium: 8.6 mg/dL — ABNORMAL LOW (ref 8.9–10.3)
Creatinine, Ser: 2.74 mg/dL — ABNORMAL HIGH (ref 0.61–1.24)
Creatinine, Ser: 2.81 mg/dL — ABNORMAL HIGH (ref 0.61–1.24)
GFR calc Af Amer: 26 mL/min — ABNORMAL LOW (ref >60)
GFR calc non Af Amer: 23 mL/min — ABNORMAL LOW (ref >60)
GFR calc non Af Amer: 22 mL/min — ABNORMAL LOW (ref >60)
GFR, EST AFRICAN AMERICAN: 27 mL/min — AB (ref >60)
GLUCOSE: 362 mg/dL — AB (ref 65–99)
Glucose, Bld: 310 mg/dL — ABNORMAL HIGH (ref 65–99)
POTASSIUM: 4 mmol/L (ref 3.5–5.1)
POTASSIUM: 4.4 mmol/L (ref 3.5–5.1)
Sodium: 134 mmol/L — ABNORMAL LOW (ref 135–145)
Sodium: 133 mmol/L — ABNORMAL LOW (ref 135–145)

## 2015-06-28 LAB — COMPREHENSIVE METABOLIC PANEL
ALK PHOS: 76 U/L (ref 38–126)
ALT: 17 U/L (ref 17–63)
AST: 15 U/L (ref 15–41)
Albumin: 2.9 g/dL — ABNORMAL LOW (ref 3.5–5.0)
Anion gap: 9 (ref 5–15)
BUN: 44 mg/dL — ABNORMAL HIGH (ref 6–20)
CALCIUM: 8.2 mg/dL — AB (ref 8.9–10.3)
CO2: 24 mmol/L (ref 22–32)
CREATININE: 2.75 mg/dL — AB (ref 0.61–1.24)
Chloride: 100 mmol/L — ABNORMAL LOW (ref 101–111)
GFR calc non Af Amer: 23 mL/min — ABNORMAL LOW (ref >60)
GFR, EST AFRICAN AMERICAN: 26 mL/min — AB (ref >60)
GLUCOSE: 200 mg/dL — AB (ref 65–99)
Potassium: 3.5 mmol/L (ref 3.5–5.1)
SODIUM: 133 mmol/L — AB (ref 135–145)
Total Bilirubin: 0.5 mg/dL (ref 0.3–1.2)
Total Protein: 6.3 g/dL — ABNORMAL LOW (ref 6.5–8.1)

## 2015-06-28 LAB — GLUCOSE, CAPILLARY
GLUCOSE-CAPILLARY: 296 mg/dL — AB (ref 65–99)
Glucose-Capillary: 214 mg/dL — ABNORMAL HIGH (ref 65–99)
Glucose-Capillary: 315 mg/dL — ABNORMAL HIGH (ref 65–99)
Glucose-Capillary: 365 mg/dL — ABNORMAL HIGH (ref 65–99)

## 2015-06-28 LAB — I-STAT TROPONIN, ED: Troponin i, poc: 0.02 ng/mL (ref 0.00–0.08)

## 2015-06-28 LAB — BRAIN NATRIURETIC PEPTIDE: B Natriuretic Peptide: 467.1 pg/mL — ABNORMAL HIGH (ref 0.0–100.0)

## 2015-06-28 LAB — TROPONIN I
Troponin I: 0.04 ng/mL — ABNORMAL HIGH (ref <0.031)
Troponin I: <0.03 ng/mL (ref <0.031)

## 2015-06-28 MED ORDER — INSULIN GLARGINE 100 UNIT/ML ~~LOC~~ SOLN
30.0000 [IU] | Freq: Two times a day (BID) | SUBCUTANEOUS | Status: DC
  Administered 2015-06-28 (×2): 30 [IU] via SUBCUTANEOUS
  Filled 2015-06-28 (×3): qty 0.3

## 2015-06-28 MED ORDER — INSULIN ASPART 100 UNIT/ML ~~LOC~~ SOLN
0.0000 [IU] | Freq: Every day | SUBCUTANEOUS | Status: DC
  Administered 2015-06-28: 3 [IU] via SUBCUTANEOUS
  Administered 2015-06-30: 2 [IU] via SUBCUTANEOUS

## 2015-06-28 MED ORDER — ACETAMINOPHEN 325 MG PO TABS: 650.0000 mg | ORAL_TABLET | ORAL | Status: DC | PRN

## 2015-06-28 MED ORDER — NITROGLYCERIN IN D5W 200-5 MCG/ML-% IV SOLN
0.0000 ug/min | Freq: Once | INTRAVENOUS | Status: AC
  Administered 2015-06-28: 5 ug/min via INTRAVENOUS
  Filled 2015-06-28: qty 250

## 2015-06-28 MED ORDER — LEVOTHYROXINE SODIUM 50 MCG PO TABS
50.0000 ug | ORAL_TABLET | Freq: Every day | ORAL | Status: DC
  Administered 2015-06-28 – 2015-07-02 (×5): 50 ug via ORAL
  Filled 2015-06-28 (×5): qty 1

## 2015-06-28 MED ORDER — ATORVASTATIN CALCIUM 40 MG PO TABS
40.0000 mg | ORAL_TABLET | Freq: Every day | ORAL | Status: DC
  Administered 2015-06-28 – 2015-07-01 (×4): 40 mg via ORAL
  Filled 2015-06-28 (×4): qty 1

## 2015-06-28 MED ORDER — LIVING BETTER WITH HEART FAILURE BOOK: Freq: Once | Status: DC

## 2015-06-28 MED ORDER — DOXAZOSIN MESYLATE 1 MG PO TABS
1.0000 mg | ORAL_TABLET | Freq: Every day | ORAL | Status: DC
  Administered 2015-06-28: 1 mg via ORAL
  Filled 2015-06-28: qty 1

## 2015-06-28 MED ORDER — HYDRALAZINE HCL 20 MG/ML IJ SOLN: 10.0000 mg | INTRAMUSCULAR | Status: DC | PRN

## 2015-06-28 MED ORDER — HYDRALAZINE HCL 20 MG/ML IJ SOLN
10.0000 mg | Freq: Four times a day (QID) | INTRAMUSCULAR | Status: DC | PRN
  Administered 2015-06-28 – 2015-06-29 (×3): 10 mg via INTRAVENOUS
  Filled 2015-06-28 (×3): qty 1

## 2015-06-28 MED ORDER — FUROSEMIDE 10 MG/ML IJ SOLN
40.0000 mg | Freq: Two times a day (BID) | INTRAMUSCULAR | Status: DC
  Administered 2015-06-28: 40 mg via INTRAVENOUS
  Filled 2015-06-28: qty 4

## 2015-06-28 MED ORDER — INSULIN ASPART 100 UNIT/ML ~~LOC~~ SOLN
0.0000 [IU] | Freq: Three times a day (TID) | SUBCUTANEOUS | Status: DC
  Administered 2015-06-28: 15 [IU] via SUBCUTANEOUS
  Administered 2015-06-28: 5 [IU] via SUBCUTANEOUS
  Administered 2015-06-28: 11 [IU] via SUBCUTANEOUS
  Administered 2015-06-29: 8 [IU] via SUBCUTANEOUS
  Administered 2015-06-29: 11 [IU] via SUBCUTANEOUS
  Administered 2015-06-29: 8 [IU] via SUBCUTANEOUS
  Administered 2015-06-30: 3 [IU] via SUBCUTANEOUS
  Administered 2015-06-30 (×2): 5 [IU] via SUBCUTANEOUS
  Administered 2015-07-01: 8 [IU] via SUBCUTANEOUS
  Administered 2015-07-01: 5 [IU] via SUBCUTANEOUS
  Administered 2015-07-01: 8 [IU] via SUBCUTANEOUS
  Administered 2015-07-02: 5 [IU] via SUBCUTANEOUS
  Administered 2015-07-02: 3 [IU] via SUBCUTANEOUS

## 2015-06-28 MED ORDER — AMLODIPINE BESYLATE 10 MG PO TABS
10.0000 mg | ORAL_TABLET | Freq: Every day | ORAL | Status: DC
  Administered 2015-06-28 – 2015-07-02 (×5): 10 mg via ORAL
  Filled 2015-06-28 (×5): qty 1

## 2015-06-28 MED ORDER — SODIUM CHLORIDE 0.9% FLUSH
3.0000 mL | INTRAVENOUS | Status: DC | PRN
  Administered 2015-06-30 – 2015-07-01 (×2): 3 mL via INTRAVENOUS
  Filled 2015-06-28 (×2): qty 3

## 2015-06-28 MED ORDER — SODIUM CHLORIDE 0.9% FLUSH
3.0000 mL | Freq: Two times a day (BID) | INTRAVENOUS | Status: DC
  Administered 2015-06-29 – 2015-07-02 (×5): 3 mL via INTRAVENOUS

## 2015-06-28 MED ORDER — ASPIRIN EC 81 MG PO TBEC
81.0000 mg | DELAYED_RELEASE_TABLET | Freq: Every day | ORAL | Status: DC
  Administered 2015-06-28 – 2015-07-02 (×5): 81 mg via ORAL
  Filled 2015-06-28 (×5): qty 1

## 2015-06-28 MED ORDER — ALBUTEROL (5 MG/ML) CONTINUOUS INHALATION SOLN
10.0000 mg/h | INHALATION_SOLUTION | RESPIRATORY_TRACT | Status: DC
  Administered 2015-06-28: 10 mg/h via RESPIRATORY_TRACT
  Filled 2015-06-28: qty 20

## 2015-06-28 MED ORDER — PANTOPRAZOLE SODIUM 40 MG PO TBEC
40.0000 mg | DELAYED_RELEASE_TABLET | Freq: Every day | ORAL | Status: DC
  Administered 2015-06-28 – 2015-07-02 (×5): 40 mg via ORAL
  Filled 2015-06-28 (×5): qty 1

## 2015-06-28 MED ORDER — ISOSORBIDE MONONITRATE ER 60 MG PO TB24
60.0000 mg | ORAL_TABLET | Freq: Every day | ORAL | Status: DC
  Administered 2015-06-28 – 2015-07-02 (×5): 60 mg via ORAL
  Filled 2015-06-28 (×5): qty 1

## 2015-06-28 MED ORDER — METHYLPREDNISOLONE SODIUM SUCC 125 MG IJ SOLR
125.0000 mg | Freq: Once | INTRAMUSCULAR | Status: AC
  Administered 2015-06-28: 125 mg via INTRAVENOUS
  Filled 2015-06-28: qty 2

## 2015-06-28 MED ORDER — HYDRALAZINE HCL 50 MG PO TABS
100.0000 mg | ORAL_TABLET | Freq: Three times a day (TID) | ORAL | Status: DC
  Administered 2015-06-28 – 2015-07-02 (×13): 100 mg via ORAL
  Filled 2015-06-28 (×13): qty 2

## 2015-06-28 MED ORDER — HEPARIN SODIUM (PORCINE) 5000 UNIT/ML IJ SOLN
5000.0000 [IU] | Freq: Three times a day (TID) | INTRAMUSCULAR | Status: DC
Start: 2015-06-28 — End: 2015-06-29
  Administered 2015-06-28 – 2015-06-29 (×4): 5000 [IU] via SUBCUTANEOUS
  Filled 2015-06-28 (×4): qty 1

## 2015-06-28 MED ORDER — SODIUM CHLORIDE 0.9 % IV SOLN: 250.0000 mL | INTRAVENOUS | Status: DC | PRN

## 2015-06-28 MED ORDER — CARVEDILOL 25 MG PO TABS
50.0000 mg | ORAL_TABLET | Freq: Two times a day (BID) | ORAL | Status: DC
  Administered 2015-06-28 – 2015-07-02 (×9): 50 mg via ORAL
  Filled 2015-06-28 (×9): qty 2

## 2015-06-28 MED ORDER — FUROSEMIDE 10 MG/ML IJ SOLN
40.0000 mg | Freq: Once | INTRAMUSCULAR | Status: AC
  Administered 2015-06-28: 40 mg via INTRAVENOUS
  Filled 2015-06-28: qty 4

## 2015-06-28 MED ORDER — FUROSEMIDE 10 MG/ML IJ SOLN
80.0000 mg | Freq: Two times a day (BID) | INTRAMUSCULAR | Status: DC
  Administered 2015-06-28 – 2015-06-30 (×4): 80 mg via INTRAVENOUS
  Filled 2015-06-28 (×5): qty 8

## 2015-06-28 MED ORDER — HYDRALAZINE HCL 50 MG PO TABS
75.0000 mg | ORAL_TABLET | Freq: Three times a day (TID) | ORAL | Status: DC
  Administered 2015-06-28: 75 mg via ORAL
  Filled 2015-06-28: qty 1

## 2015-06-28 MED ORDER — ONDANSETRON HCL 4 MG/2ML IJ SOLN: 4.0000 mg | Freq: Four times a day (QID) | INTRAMUSCULAR | Status: DC | PRN

## 2015-06-28 MED ORDER — ALBUTEROL SULFATE (2.5 MG/3ML) 0.083% IN NEBU: 3.0000 mL | INHALATION_SOLUTION | Freq: Four times a day (QID) | RESPIRATORY_TRACT | Status: DC | PRN

## 2015-06-28 NOTE — Care Management Note (Signed)
Case Management Note  Patient Details  Name: Leonard [name] MRN: [id number] Date of Birth: 12-19-1950  Subjective/Objective:     Adm w chf               Action/Plan: lives w fam, pcp dr schmitz   Expected Discharge Date:                  Expected Discharge Plan:  Home/Self Care  In-House Referral:     Discharge planning Services     Post Acute Care Choice:    Choice offered to:     DME Arranged:    DME Agency:     HH Arranged:    [place] Agency:     Status of Service:     Medicare Important Message Given:    Date Medicare IM Given:    Medicare IM give by:    Date Additional Medicare IM Given:    Additional Medicare Important Message give by:     If discussed at [place] of Stay Meetings, dates discussed:    Additional Comments: ur review done  Lacretia Leigh, RN 06/28/2015, 9:15 AM

## 2015-06-28 NOTE — H&P (Signed)
[facility] Admission History and Physical Service Pager: [phone]  [name] Medical record number: [id number] Date of birth: 1950/10/06 Age: 65 y.o. Gender: male  Primary Care Provider: Clearance Coots, MD Consultants: HF team Code Status: Full  Chief Complaint: Shortness of breath  Assessment and Plan: Leonard [name] is a 65 y.o. male presenting with shortness of breath. 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  Shortness of Breath: Likely 2/2 to CHF exacerbation. Likely non-compliance. On Lasix 80mg  bid at home. BNP 467, up from 227 at last admission. Weight 241lb on admission, up 15 lbs since discharge 6 days ago. CXR showing vascular congestion and pulmonary edema. On exam, crackles present in the lung bases bilaterally and 2+ pitting edema to the mid-shin. Last ECHO 06/05/2015: EF 60-65%, G2DD. Another differential is COPD exacerbation given his occasional wheezing, but he has not had any cough or sputum production. Pneumonia less likely without fever or cough. ACS less likely with negative troponin and EKG that is unchanged from previous. - Admit to stepdown, attending Dr. Ardelia Mems - Currently requiring BiPAP. Will monitor respiratory status and wean as tolerated. - HF team consult - s/p Lasix 40mg  IV x 1 in the ED. Will give Lasix 40mg  IV bid today. - s/p Solumedrol 125mg  VI x 1 in the ED. Will not continue at this time, as I do not believe this is a COPD exacerbation. - Albuterol prn - Trend troponins - Repeat EKG tomorrow morning - Repeat BMET this afternoon - Daily weights - Strict I/O's - Cardiac monitoring - PT/OT   Uncontrolled HTN: BP 178/68 in the ED. - s/p Nitro drip in the ED - Continue home meds: Norvasc 10mg  daily, Doxazosin 1mg  daily, Hydralazine 75mg  tid, Coreg 50mg  bid.  Type 2 DM: Poorly controlled. A1c 10.5%. On Lantus 65  units bid and Novolog 35 units tid with meals at home. - Will start with Lantus 30 units bid and moderate SSI - CBGs four times a day  CKD IIIB: Cr 2.75 in the ED. Baseline 2.5-2.7. - Recommend Nephrology outpatient follow-up  Hypothyroidism: TSH 12.559 last admission. - Continue Synthroid 65mcg daily  CAD s/p CABG x 3 / PAD / HLD:  - Continue home Lipitor, Coreg, ASA  Microcytic Anemia: Likely iron deficiency in combination with anemia of chronic disease. Iron panel 06/04/2015: Iron 24, ferritin 44, TIBC 405.  FEN/GI: Heart healthy carb-modified diet, Protonix 40mg  daily Prophylaxis: Heparin sq  Disposition: Admit to stepdown, attending Dr. Ardelia Mems.  History of Present Illness:  Leonard [name] is a 65 y.o. male presenting with shortness of breath. He was recently discharged from the hospital on 5/19. He says that he was laying down on the couch and all of a sudden around 11:30pm last night he started to feel shortness of breath. He tried to go out to the porch to see if this would be helpful and felt a little better with the fresh air. Breathing got worse with laying flat. Says he has been using 1-2 pillows to help him sleep. He denies any cough, fevers. He denies any chest pain, but endorsse some chest "tightness".  He says that he has been taking Lasix 80mg  twice a day; says he is peeing regularly and the lasix is working fine. No changes in his diet, not drinking more fluids and denies salty foods. There has been a home health nurse that has been  coming out to the house, next visit was supposed to be today.   Per report, his O2 saturations were in the 70s when EMS got to his house. On arrival to the ED, he was initially requiring non-rebreather and was then transitioned to BiPAP for increased work of breathing. BNP was 467, i-stat trop 0.02, WBC 9.7. CXR showed vascular congestion and mild cardiomyopathy with bilateral central airspace opacities concerning for pulmonary edema. The ED  physician heard crackles and wheezing so Pt was given Lasix 40mg  IV x 1 and Solumedrol 125mg  IV x 1. Nitro drip was started for elevated BPs.  Review Of Systems: Per HPI with the following additions: denies leg swelling Otherwise the remainder of the systems were negative.  Patient Active Problem List   Diagnosis Date Noted  . Hematuria 06/27/2015  . CKD (chronic kidney disease)   . CHF (congestive heart failure) ([place]) 06/18/2015  . Hypertensive emergency 06/18/2015  . Acute on chronic congestive heart failure ([place])   . Pain in the chest   . Renal failure (ARF), acute on chronic (HCC)   . Knee pain   . Renal failure   . Acute CHF (congestive heart failure) ([place]) 06/03/2015  . Chest pain 06/03/2015  . Skin lesion of face 02/22/2015  . 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 Anemia in chronic kidney disease 01/15/2015  . CKD (chronic kidney disease) stage 4, GFR 15-29 ml/min (HCC) 01/15/2015  . Hypothyroidism 11/22/2014  . Health care maintenance 11/22/2014  . Acute on chronic diastolic heart failure ([place]) 07/04/2014  . Tooth pain 05/11/2014  . Chronic diastolic heart failure ([place]) 03/16/2014  . COPD (chronic obstructive pulmonary disease) ([place]) 02/16/2014  . Diastolic heart failure ([place])   . Bilateral lower extremity edema 10/14/2013  . History of tobacco use 08/23/2013  . 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 CAD (coronary artery disease) 04/06/2013  . NSTEMI (non-ST elevated myocardial infarction) ([place]) 04/05/2013  . PVD - hx of Rt SFA PTA and s/p Rt 4-5th toe amp Dec 2014 04/05/2013  . Atherosclerosis of native arteries of the extremities with gangrene ([place]) 02/15/2013  . Normocytic anemia 01/26/2013  . 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 DM (diabetes mellitus), type 2 with renal complications ([place]) [id number]  . Mixed hyperlipidemia 07/17/2008    Past Medical History: Past Medical History  Diagnosis Date  . Hypertension   . PAD (peripheral artery  disease) ([place])   . Gangrene of toe ([place])     right 5th/notes 01/04/2013   . High cholesterol   . Type II diabetes mellitus ([place])   . 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 CHF (congestive heart failure) ([place])   . Shortness of breath dyspnea     Past Surgical History: Past Surgical History  Procedure Laterality Date  . Throat surgery  1983    polyps  . Angioplasty / stenting femoral Right 01/13/2013    superficial femoral artery x 2 (3 mm x 60 mm, 4 mm x 60 mm)  . Tonsillectomy and adenoidectomy  ~ 1965  . Amputation Right 01/28/2013    Procedure: RAY AMPUTATION RIGHT  4th & 5th TOE;  Surgeon: Rosetta Posner, MD;  [facility];  Service: Vascular;  Laterality: Right;  . Coronary artery bypass graft N/A 04/07/2013    Procedure: CORONARY ARTERY BYPASS GRAFTING (CABG);  Surgeon: Ivin Poot, MD;  Location: [place];  Service: Open Heart Surgery;  Laterality: N/A;  . Intraoperative transesophageal echocardiogram N/A 04/07/2013  Procedure: INTRAOPERATIVE TRANSESOPHAGEAL ECHOCARDIOGRAM;  Surgeon: Ivin Poot, MD;  Location: [place];  Service: Open Heart Surgery;  Laterality: N/A;  . Lower extremity angiogram Bilateral 01/06/2013    Procedure: LOWER EXTREMITY ANGIOGRAM;  Surgeon: Conrad Omak, MD;  Location[facility] CATH LAB;  Service: Cardiovascular;  Laterality: Bilateral;  . Lower extremity angiogram Right 01/13/2013    Procedure: LOWER EXTREMITY ANGIOGRAM;  Surgeon: Conrad , MD;  Location: [facility] CATH LAB;  Service: Cardiovascular;  Laterality: Right;  . Left heart catheterization with coronary angiogram N/A 04/05/2013    Procedure: LEFT HEART CATHETERIZATION WITH CORONARY ANGIOGRAM;  Surgeon: Peter M Martinique, MD;  [facility] C[facility] LAB;  Service: Cardiovascular;  Laterality: N/A;  . Skin cancer excision  2017    Social History: Social History  Substance Use Topics  . Smoking status: Former Smoker -- 1.00 packs/day for 46 years    Types: Cigarettes    Start  date: 02/04/1967    Quit date: 02/03/2013  . Smokeless tobacco: Never Used     Comment: Doing well with quitting.  . Alcohol Use: No   Additional social history: None Please also refer to relevant sections of EMR.  Family History: Family History  Problem Relation Age of Onset  . Cancer Mother     lung cancer  . Hypertension Mother   . Cancer Sister     patient thinks it was uterine cancer  . Hypertension Sister   . Heart attack Sister   . Stroke Neg Hx     Allergies and Medications: No Known Allergies No current facility-administered medications on file prior to encounter.   Current Outpatient Prescriptions on File Prior to Encounter  Medication Sig Dispense Refill  . acetaminophen (TYLENOL) 500 MG tablet Take 1 tablet (500 mg total) by mouth every 6 (six) hours as needed for mild pain or moderate pain. 30 tablet 0  . ADVAIR DISKUS 250-50 MCG/DOSE AEPB INHALE 1 PUFF INTO THE LUNGS 2 TIMES DAILY 60 each 1  . albuterol (PROVENTIL HFA;VENTOLIN HFA) 108 (90 Base) MCG/ACT inhaler Inhale 2 puffs into the lungs every 6 (six) hours as needed for wheezing or shortness of breath. 1 Inhaler 2  . amLODipine (NORVASC) 10 MG tablet Take 1 tablet (10 mg total) by mouth daily. 90 tablet 1  . aspirin EC 81 MG EC tablet Take 1 tablet (81 mg total) by mouth daily. 30 tablet 0  . atorvastatin (LIPITOR) 40 MG tablet TAKE 1 TABLET BY MOUTH EVERY DAY AT 6 PM 90 tablet 1  . carvedilol (COREG) 25 MG tablet Take 2 tablets (50 mg total) by mouth 2 (two) times daily with a meal. 180 tablet 3  . doxazosin (CARDURA) 1 MG tablet Take 1 tablet (1 mg total) by mouth daily. 30 tablet 2  . furosemide (LASIX) 40 MG tablet Take 2 tablets (80 mg total) by mouth 2 (two) times daily. 135 tablet 3  . hydrALAZINE (APRESOLINE) 25 MG tablet Take 3 tablets (75 mg total) by mouth every 8 (eight) hours. 90 tablet 2  . insulin aspart (NOVOLOG FLEXPEN) 100 UNIT/ML FlexPen Inject 35 Units into the skin 3 (three) times daily  with meals. 15 mL 6  . Insulin Glargine (LANTUS SOLOSTAR) 100 UNIT/ML Solostar Pen INJECT 65 UNITS INTO THE SKIN 2 TIMES DAILY 15 mL 2  . levothyroxine (SYNTHROID, LEVOTHROID) 50 MCG tablet Take 1 tablet (50 mcg total) by mouth daily before breakfast. 30 tablet 2  . nitroGLYCERIN (NITROSTAT) 0.4 MG SL tablet Place 1  tablet (0.4 mg total) under the tongue every 5 (five) minutes as needed for chest pain. 30 tablet 12  . pantoprazole (PROTONIX) 40 MG tablet Take 1 tablet (40 mg total) by mouth daily. 30 tablet 1  . polyethylene glycol (MIRALAX / GLYCOLAX) packet Take 17 g by mouth daily as needed for mild constipation or moderate constipation. 14 each 0  . SURE COMFORT PEN NEEDLES 31G X 8 MM MISC USE WITH BOTH INSULIN PENS TWICE DAILY (4 PER DAY) 100 each 5    Objective: BP 184/88 mmHg  Pulse 73  Temp(Src) 98.4 F (36.9 C) (Axillary)  Resp 23  SpO2 99% Exam: General: Tired-appearing, able to answer questions, NAD Eyes: PERRLA, EOMI, no scleral icterus ENTM: Nose normal, MMM Neck: Supple, no JVD Cardiovascular: normal rate, regular rhythm, no murmurs, rubs or gallop. 2+ radial pulses bilaterally  Respiratory: bibasilar crackles, some scattered wheezes, rate approx 24, increased work of breathing on bipap. Abdomen: +BS, soft, non-tender, non-distended MSK: LE edema bilaterally to mid-shin Skin: No rashes or lesions Neuro: Awake, alert, oriented, CN 2-12 grossly intact, no focal deficits Psych: Appropriate affect, normal behavior  Labs and Imaging: CBC BMET   Recent Labs Lab 06/28/15 0326  WBC 9.7  HGB 7.7*  HCT 25.4*  PLT 189    Recent Labs Lab 06/28/15 0326  NA 133*  K 3.5  CL 100*  CO2 24  BUN 44*  CREATININE 2.75*  GLUCOSE 200*  CALCIUM 8.2*     BNP 467 i-stat troponin: 0.02  Sela Hua, MD 06/28/2015, 4:56 AM PGY-1, [place] Intern pager: [phone], text pages welcome

## 2015-06-28 NOTE — ED Notes (Signed)
MD at bedside. 

## 2015-06-28 NOTE — Evaluation (Signed)
Physical Therapy Evaluation and Discharge Patient Details Name: Leonard [name] MRN: [id number] DOB: 1951/01/02 Today's Date: 06/28/2015   History of Present Illness  65 yo M with history of CKD, CAD, diastolic CHF, type 2 diabetes, hyperlipidemia, PVD, COPD, hypertension presenting with shortness of breath and hypoxia. CXR, labs and vitals point to CHF as the cause.     Clinical Impression  Patient evaluated by Physical Therapy with no further acute PT needs identified. All education has been completed and the patient has no further questions. Patient is at his baseline; all education was completed by PT during recent admission. PT is signing off. RN made aware of d/c from PT and need to ambulate in halls with nursing. Thank you for this referral.     Follow Up Recommendations  (insurance does not cover HHPT; pt can't self-pay)    Equipment Recommendations  None recommended by PT    Recommendations for Other Services       Precautions / Restrictions Precautions Precautions: Fall Precaution Comments: Most recent fall was last October in his apartment Restrictions Weight Bearing Restrictions: No      Mobility  Bed Mobility Overal bed mobility: Modified Independent             General bed mobility comments: sitting EOB on arrival  Transfers Overall transfer level: Modified independent Equipment used: Rolling walker (2 wheeled) Transfers: Sit to/from Stand Sit to Stand: Modified independent (Device/Increase time)         General transfer comment: requires assist only due to multiple lines (pt demonstrated safe use of RW)  Ambulation/Gait Ambulation/Gait assistance: Supervision Ambulation Distance (Feet): 90 Feet Assistive device: Rolling walker (2 wheeled) Gait Pattern/deviations: Step-through pattern;Decreased stride length   Gait velocity interpretation: Below normal speed for age/gender General Gait Details: at times lifts RW for a step and resumes  pushing/rolling it; standing rest break x 2  Stairs            Wheelchair Mobility    Modified Rankin (Stroke Patients Only)       Balance Overall balance assessment: Needs assistance Sitting-balance support: Feet supported;No upper extremity supported Sitting balance-Leahy Scale: Good     Standing balance support: No upper extremity supported Standing balance-Leahy Scale: Fair               High level balance activites: Direction changes;Turns;Sudden stops;Head turns High Level Balance Comments: slight drift to left with r t head turns; pt able to self-correct with RW             Pertinent Vitals/Pain VSS on room air (SaO2 91-93%)  Pain Assessment: No/denies pain    Home Living Family/patient expects to be discharged to:: Private residence Living Arrangements: Other relatives (sister) Available Help at Discharge: Family;Available 24 hours/day (sister) Type of Home: Apartment Home Access: Level entry     Home Layout: One level Home Equipment: Clinical cytogeneticist - 2 wheels      Prior Function Level of Independence: Needs assistance   Gait / Transfers Assistance Needed: Pt walks with walker indoors and outdoors.  Fatigues quickly. Reports this is new since last admission  ADL's / Homemaking Assistance Needed: Pt has aid 3x week to assist with shower.  Pt is able to assist with bathing but fatigues so quickly with bathing and dressing that often times cannot complete the tasks before lunch time.        Hand Dominance   Dominant Hand: Right    Extremity/Trunk Assessment   Upper Extremity Assessment:  Overall WFL for tasks assessed           Lower Extremity Assessment: Overall WFL for tasks assessed RLE Deficits / Details: edema LLE Deficits / Details: edema  Cervical / Trunk Assessment: Normal  Communication   Communication: No difficulties  Cognition Arousal/Alertness: Awake/alert Behavior During Therapy: WFL for tasks  assessed/performed Overall Cognitive Status: Within Functional Limits for tasks assessed                      General Comments      Exercises        Assessment/Plan    PT Assessment Patent does not need any further PT services  PT Diagnosis Difficulty walking;Generalized weakness   PT Problem List    PT Treatment Interventions     PT Goals (Current goals can be found in the Care Plan section) Acute Rehab PT Goals Patient Stated Goal: go home.    Frequency     Barriers to discharge        Co-evaluation               End of Session Equipment Utilized During Treatment: Gait belt Activity Tolerance: Patient tolerated treatment well Patient left: in chair;with call bell/phone within reach Nurse Communication: Mobility status (no PT needs; needs to walk daily)         Time: [id number] PT Time Calculation (min) (ACUTE ONLY): 35 min   Charges:   PT Evaluation $PT Eval Low Complexity: 1 Procedure     PT G Codes:        Urvi Imes 07/22/2015, 4:59 PM Pager [phone]

## 2015-06-28 NOTE — ED Notes (Signed)
Spoke to [facility] regarding blood pressure, verbal order obtained for nitro drip

## 2015-06-28 NOTE — Progress Notes (Signed)
MD notified regarding HTN.  Verbal order to D/C Nitro gtt and continue PO meds, PRN to be considered.  No new orders at this time.

## 2015-06-28 NOTE — ED Notes (Signed)
Pt arrives via EMS from home. Pt called EMS for shortness of breath x4 hrs. Denied CP. Recent admission for same. 75% on RA upon EMS arrival. EMS placed pt on non-rebreather 90%. Diminished lower lobes, rhonchi upper.

## 2015-06-28 NOTE — Procedures (Signed)
Pt placed on BiPAP and given CAT. Pt is tolerating well and resting comfortably.

## 2015-06-28 NOTE — Progress Notes (Signed)
Patient is on 4L [place] with O2 Sat 100%. BIPAP is not needed at this time.

## 2015-06-28 NOTE — ED Notes (Signed)
Respiratory at bedside.

## 2015-06-28 NOTE — ED Provider Notes (Signed)
CSN: [id number]     Arrival date & time 06/28/15  0300 History  By signing my name below, I, Rowan Blase, attest that this documentation has been prepared under the direction and in the presence of Julianne Rice, MD . Electronically Signed: Rowan Blase, Scribe. 06/28/2015. 5:11 AM.    Chief Complaint  Patient presents with  . Shortness of Breath   The history is provided by the patient. No language interpreter was used.   HPI Comments:  Leonard [name] is a 65 y.o. male with PMHx of CHF, HTN, HLD and DM who presents to the Emergency Department via EMS complaining of shortness of breath onset a few hours ago while getting ready to go to sleep. Pt reports associated diaphoresis and chest tightness. Per EMS pt was 75% on RA at home. Pt was recently hospitalized for CHF exacerbation. Pt states he hasn't started using his inhaler yet; he states he has been taking Lasix as prescribed. Pt is not on oxygen at home. Denies cough, fever, h/o asthma, or h/o COPD.   Past Medical History  Diagnosis Date  . Hypertension   . PAD (peripheral artery disease) ([place])   . Gangrene of toe ([place])     right 5th/notes 01/04/2013   . High cholesterol   . Type II diabetes mellitus ([place])   . 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 CHF (congestive heart failure) ([place])   . Shortness of breath dyspnea    Past Surgical History  Procedure Laterality Date  . Throat surgery  1983    polyps  . Angioplasty / stenting femoral Right 01/13/2013    superficial femoral artery x 2 (3 mm x 60 mm, 4 mm x 60 mm)  . Tonsillectomy and adenoidectomy  ~ 1965  . Amputation Right 01/28/2013    Procedure: RAY AMPUTATION RIGHT  4th & 5th TOE;  Surgeon: Rosetta Posner, MD;  Location: [facility] OR;  Service: Vascular;  Laterality: Right;  . Coronary artery bypass graft N/A 04/07/2013    Procedure: CORONARY ARTERY BYPASS GRAFTING (CABG);  Surgeon: Ivin Poot, MD;  Location: [place];  Service: Open Heart  Surgery;  Laterality: N/A;  . Intraoperative transesophageal echocardiogram N/A 04/07/2013    Procedure: INTRAOPERATIVE TRANSESOPHAGEAL ECHOCARDIOGRAM;  Surgeon: Ivin Poot, MD;  Location: [place];  Service: Open Heart Surgery;  Laterality: N/A;  . Lower extremity angiogram Bilateral 01/06/2013    Procedure: LOWER EXTREMITY ANGIOGRAM;  Surgeon: Conrad Fairhaven, MD;  Location: [facility] CATH LAB;  Service: Cardiovascular;  Laterality: Bilateral;  . Lower extremity angiogram Right 01/13/2013    Procedure: LOWER EXTREMITY ANGIOGRAM;  Surgeon: Conrad Mill Hall, MD;  Location: [facility] CATH LAB;  Service: Cardiovascular;  Laterality: Right;  . Left heart catheterization with coronary angiogram N/A 04/05/2013    Procedure: LEFT HEART CATHETERIZATION WITH CORONARY ANGIOGRAM;  Surgeon: Peter M Martinique, MD;  Location: [facility] CATH LAB;  Service: Cardiovascular;  Laterality: N/A;  . Skin cancer excision  2017   Family History  Problem Relation Age of Onset  . Cancer Mother     lung cancer  . Hypertension Mother   . Cancer Sister     patient thinks it was uterine cancer  . Hypertension Sister   . Heart attack Sister   . Stroke Neg Hx    Social History  Substance Use Topics  . Smoking status: Former Smoker -- 1.00 packs/day for 46 years    Types: Cigarettes    Start date: 02/04/1967  Quit date: 02/03/2013  . Smokeless tobacco: Never Used     Comment: Doing well with quitting.  . Alcohol Use: No    Review of Systems  Constitutional: Positive for diaphoresis. Negative for fever and chills.  Respiratory: Positive for chest tightness and shortness of breath. Negative for cough.   Cardiovascular: Positive for leg swelling. Negative for chest pain and palpitations.  Gastrointestinal: Negative for nausea, vomiting, abdominal pain, diarrhea and constipation.  Musculoskeletal: Negative for back pain and neck pain.  Skin: Negative for rash and wound.  Neurological: Negative for dizziness, weakness, light-headedness,  numbness and headaches.  All other systems reviewed and are negative.   Allergies  Review of patient's allergies indicates no known allergies.  Home Medications   Prior to Admission medications   Medication Sig Start Date End Date Taking? Authorizing Provider  acetaminophen (TYLENOL) 500 MG tablet Take 1 tablet (500 mg total) by mouth every 6 (six) hours as needed for mild pain or moderate pain. 06/06/15   Mercy Riding, MD  ADVAIR DISKUS 250-50 MCG/DOSE AEPB INHALE 1 PUFF INTO THE LUNGS 2 TIMES DAILY 06/25/15   Rosemarie Ax, MD  albuterol (PROVENTIL HFA;VENTOLIN HFA) 108 (90 Base) MCG/ACT inhaler Inhale 2 puffs into the lungs every 6 (six) hours as needed for wheezing or shortness of breath. 06/12/15   Hillary Corinda Gubler, MD  amLODipine (NORVASC) 10 MG tablet Take 1 tablet (10 mg total) by mouth daily. 03/23/15   Zenia Resides, MD  aspirin EC 81 MG EC tablet Take 1 tablet (81 mg total) by mouth daily. 06/06/15   Mercy Riding, MD  atorvastatin (LIPITOR) 40 MG tablet TAKE 1 TABLET BY MOUTH EVERY DAY AT 6 PM 03/23/15   Zenia Resides, MD  carvedilol (COREG) 25 MG tablet Take 2 tablets (50 mg total) by mouth 2 (two) times daily with a meal. 04/05/15   Zenia Resides, MD  doxazosin (CARDURA) 1 MG tablet Take 1 tablet (1 mg total) by mouth daily. 06/22/15   Sela Hua, MD  furosemide (LASIX) 40 MG tablet Take 2 tablets (80 mg total) by mouth 2 (two) times daily. 06/22/15   Sela Hua, MD  hydrALAZINE (APRESOLINE) 25 MG tablet Take 3 tablets (75 mg total) by mouth every 8 (eight) hours. 06/22/15   Sela Hua, MD  insulin aspart (NOVOLOG FLEXPEN) 100 UNIT/ML FlexPen Inject 35 Units into the skin 3 (three) times daily with meals. 05/17/15   Zenia Resides, MD  Insulin Glargine (LANTUS SOLOSTAR) 100 UNIT/ML Solostar Pen INJECT 65 UNITS INTO THE SKIN 2 TIMES DAILY 06/26/15   Rosemarie Ax, MD  levothyroxine (SYNTHROID, LEVOTHROID) 50 MCG tablet Take 1 tablet (50 mcg total) by mouth daily  before breakfast. 06/22/15   Sela Hua, MD  nitroGLYCERIN (NITROSTAT) 0.4 MG SL tablet Place 1 tablet (0.4 mg total) under the tongue every 5 (five) minutes as needed for chest pain. 06/06/15   Mercy Riding, MD  pantoprazole (PROTONIX) 40 MG tablet Take 1 tablet (40 mg total) by mouth daily. 06/06/15   Mercy Riding, MD  polyethylene glycol (MIRALAX / GLYCOLAX) packet Take 17 g by mouth daily as needed for mild constipation or moderate constipation. 06/26/15   Rosemarie Ax, MD  SURE COMFORT PEN NEEDLES 31G X 8 MM MISC USE WITH BOTH INSULIN PENS TWICE DAILY (4 PER DAY) 06/14/15   Alexander J Karamalegos, DO   BP 185/78 mmHg  Pulse 66  Temp(Src) 98.4 F (  36.9 C) (Axillary)  Resp 29  SpO2 100% Physical Exam  Constitutional: He is oriented to person, place, and time. He appears well-developed and well-nourished. No distress.  HENT:  Head: Normocephalic and atraumatic.  Mouth/Throat: Oropharynx is clear and moist. No oropharyngeal exudate.  Eyes: EOM are normal. Pupils are equal, round, and reactive to light.  Neck: Normal range of motion. Neck supple. No JVD present.  Cardiovascular: Normal rate and regular rhythm.   Pulmonary/Chest: Effort normal. No respiratory distress. He has wheezes (expiratory wheezing and upper lung fields bilaterally.). He has rales (diminished breath sounds bilateral bases with scattered crackles).  Abdominal: Soft. Bowel sounds are normal. He exhibits no distension and no mass. There is no tenderness. There is no rebound and no guarding.  Musculoskeletal: Normal range of motion. He exhibits no tenderness. Edema:  pitting edema bilateral lower extremities. No asymmetry noted. Distal pulses are intact.  Neurological: He is alert and oriented to person, place, and time.  Moves all extremities without deficit. Sensation is fully intact.  Skin: Skin is warm and dry. No rash noted. No erythema.  Psychiatric: He has a normal mood and affect. His behavior is normal.   Nursing note and vitals reviewed.   ED Course  Procedures  DIAGNOSTIC STUDIES:  Oxygen Saturation is 96% on rebreather, adequate by my interpretation.    COORDINATION OF CARE:  3:22 AM Will administer breathing treatment and order chest x-ray, BNP, CBC, CMP and troponin. Discussed treatment plan with pt at bedside and pt agreed to plan.  Labs Review Labs Reviewed  BRAIN NATRIURETIC PEPTIDE - Abnormal; Notable for the following:    B Natriuretic Peptide 467.1 (*)    All other components within normal limits  CBC WITH DIFFERENTIAL/PLATELET - Abnormal; Notable for the following:    RBC 3.37 (*)    Hemoglobin 7.7 (*)    HCT 25.4 (*)    MCV 75.4 (*)    MCH 22.8 (*)    RDW 19.7 (*)    All other components within normal limits  COMPREHENSIVE METABOLIC PANEL - Abnormal; Notable for the following:    Sodium 133 (*)    Chloride 100 (*)    Glucose, Bld 200 (*)    BUN 44 (*)    Creatinine, Ser 2.75 (*)    Calcium 8.2 (*)    Total Protein 6.3 (*)    Albumin 2.9 (*)    GFR calc non Af Amer 23 (*)    GFR calc Af Amer 26 (*)    All other components within normal limits  I-STAT TROPOININ, ED    Imaging Review Dg Chest Port 1 View  06/28/2015  CLINICAL DATA:  Acute onset of shortness of breath, diaphoresis and generalized chest tightness. Decreased O2 saturation. Initial encounter. EXAM: PORTABLE CHEST 1 VIEW COMPARISON:  Chest radiograph performed 06/18/2015 FINDINGS: The lungs are well-aerated. Vascular congestion is noted. Bilateral central airspace opacities raise concern for pulmonary is edema lungs. There is no evidence of pleural effusion or pneumothorax. The cardiomediastinal silhouette is mildly enlarged. The patient is status post median sternotomy, with evidence of prior CABG. No acute osseous abnormalities are seen. IMPRESSION: Vascular congestion and mild cardiomegaly. Bilateral central airspace opacities raise concern for pulmonary edema. Electronically Signed   By: Garald Balding M.D.   On: 06/28/2015 03:44   I have personally reviewed and evaluated these images and lab results as part of my medical decision-making.   EKG Interpretation   Date/Time:  Thursday Jun 28 2015 03:13:06  EDT Ventricular Rate:  75 PR Interval:  174 QRS Duration: 97 QT Interval:  352 QTC Calculation: 393 R Axis:   42 Text Interpretation:  Sinus rhythm Probable left atrial enlargement  Probable anteroseptal infarct, old Borderline repolarization abnormality  Confirmed by Lita Mains  MD, Kypton Eltringham ([id number]) on 06/28/2015 3:47:04 AM      MDM   Final diagnoses:  Acute on chronic congestive heart failure, unspecified congestive heart failure type (HCC)  Chronic obstructive pulmonary disease, unspecified COPD type ([place])    I personally performed the services described in this documentation, which was scribed in my presence. The recorded information has been reviewed and is accurate.    Patient with some improvement on BiPAP and breathing treatment. Chest x-ray with evidence of pulmonary edema. Will give IV Lasix. I discussed with family medicine service and will admit to step down bed.  Julianne Rice, MD 06/28/15 [phone]

## 2015-06-28 NOTE — Telephone Encounter (Signed)
CSW received referral from MD that patient has been admitted to [facility]. Concerned with patient managing his chronic medical conditions and possible placement.  Call to patient's hospital room to discuss community supportive services when he is discharged from the hospital.  Patient currently lives with his sister [name].   The following was discussed:  Assisted Living and family care home placement, Care management services with [facility] and managing his chronic medical needs.  Patient was pleasant during conversation.  States he is not interested in ALF at this time but is ok for CSW to make the referral to [facility].    Referral completed and faxed to [facility].  Follow up call to [facility] spoke with Jerral Bonito [phone].  She will follow up with CSW once patient is discharged. CSW will also meet with patient during his hospital follow up visit at [place].  Casimer Lanius. Alexander Work,  [phone] 4:46 PM

## 2015-06-28 NOTE — Consult Note (Signed)
Advanced Heart Failure Team Consult Note  Referring Physician: Dr. Ardelia Mems Primary Physician: Clearance Coots, MD Primary Nephrologist: [facility] Primary Cardiologist:  Previously Mare Ferrari  Reason for Consultation: A/C diastolic HF  HPI:    Leonard [name] is a 65 y.o. male with history of uncontrolled HTN, 123456, Chronic diastolic CHF Echo [id number] LVEF 60-65% with grade 2 DD and normal RV. 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  Recently admitted 5/15 - 06/22/15 with hypertensive emergency, chest pain, and AKI.  Work up for ACS unremarkable. HTN thought to be 2/2 volume overload + non-compliance with rebound hypertension from missing doses of his home clonidine.  Clonidine stopped with worries for further rebound and hydralazine increased to 75 mg TID and Doxazosin 1 mg daily per Renal recommendations.  He diuresed 9 L and was down 18 lbs. Discharge weight 228 lbs.   He presented to [facility] via EMS due to acute SOB with associated chest tightness and diaphoresis.  He states he has been taking lasix as prescribed on discharge, though he hadn't started using his inhaler yet. Pertinent labs on admission include K 3.5, Creatinine 2.75 (stable from previous discharge creatinine of 2.88), BNP 467.1, and Troponin 0.03. CXR shows vascular congestion and mild cardiomegaly with bilateral central airspace opacities concerning for pulmonary edema.   He states that he adhered to medication changes for meds he already had on recent discharge, though he did not pick up inhaler or doxazosin. He does not weight daily so did not notice his weight going up.  At baseline, he has SOB with ADLs such as showering or changing clothes.  Has DOE on a hill but avoids stairs if he can help it.  Not very active.  Estimates he drinks around 80 oz a day. Watches his salt, but loves hot dogs and potato chips but sister has been limiting them since his last discharge.  (They tried  [place] dogs, which are just as high in sodium, which they were not aware of).  He does occasionally have chest pain with exertion that gets better with Nitroglycerin and rest.  Review of Systems: [y] = yes, [ ]  = no   General: Weight gain [y]; Weight loss [ ] ; Anorexia [ ] ; Fatigue [ ] ; Fever [ ] ; Chills [ ] ; Weakness [ ]   Cardiac: Chest pain/pressure [y]; Resting SOB [ ] ; Exertional SOB [y]; Orthopnea [y]; Pedal Edema [y]; Palpitations [ ] ; Syncope [ ] ; Presyncope [ ] ; Paroxysmal nocturnal dyspnea[ ]   Pulmonary: Cough [ ] ; Wheezing[ ] ; Hemoptysis[ ] ; Sputum [ ] ; Snoring [ ]   GI: Vomiting[ ] ; Dysphagia[ ] ; Melena[ ] ; Hematochezia [ ] ; Heartburn[ ] ; Abdominal pain [ ] ; Constipation [ ] ; Diarrhea [ ] ; BRBPR [ ]   GU: Hematuria[ ] ; Dysuria [ ] ; Nocturia[ ]   Vascular: Pain in legs with walking [ ] ; Pain in feet with lying flat [ ] ; Non-healing sores [ ] ; Stroke [ ] ; TIA [ ] ; Slurred speech [ ] ;  Neuro: Headaches[ ] ; Vertigo[ ] ; Seizures[ ] ; Paresthesias[ ] ;Blurred vision [ ] ; Diplopia [ ] ; Vision changes [ ]   Ortho/Skin: Arthritis [u]; Joint pain [y]; Muscle pain [ ] ; Joint swelling [ ] ; Back Pain [ ] ; Rash [ ]   Psych: Depression[ ] ; Anxiety[ ]   Heme: Bleeding problems [ ] ; Clotting disorders [ ] ; Anemia [ ]   Endocrine: Diabetes [ ] ; Thyroid dysfunction[ ]   Home Medications Prior to Admission medications   Medication Sig Start Date End Date Taking? Authorizing  Provider  acetaminophen (TYLENOL) 500 MG tablet Take 1 tablet (500 mg total) by mouth every 6 (six) hours as needed for mild pain or moderate pain. 06/06/15   Mercy Riding, MD  ADVAIR DISKUS 250-50 MCG/DOSE AEPB INHALE 1 PUFF INTO THE LUNGS 2 TIMES DAILY 06/25/15   Rosemarie Ax, MD  albuterol (PROVENTIL HFA;VENTOLIN HFA) 108 (90 Base) MCG/ACT inhaler Inhale 2 puffs into the lungs every 6 (six) hours as needed for wheezing or shortness of breath. 06/12/15   Hillary Corinda Gubler, MD  amLODipine (NORVASC) 10 MG tablet Take 1 tablet (10 mg  total) by mouth daily. 03/23/15   Zenia Resides, MD  aspirin EC 81 MG EC tablet Take 1 tablet (81 mg total) by mouth daily. 06/06/15   Mercy Riding, MD  atorvastatin (LIPITOR) 40 MG tablet TAKE 1 TABLET BY MOUTH EVERY DAY AT 6 PM 03/23/15   Zenia Resides, MD  carvedilol (COREG) 25 MG tablet Take 2 tablets (50 mg total) by mouth 2 (two) times daily with a meal. 04/05/15   Zenia Resides, MD  doxazosin (CARDURA) 1 MG tablet Take 1 tablet (1 mg total) by mouth daily. 06/22/15   Sela Hua, MD  furosemide (LASIX) 40 MG tablet Take 2 tablets (80 mg total) by mouth 2 (two) times daily. 06/22/15   Sela Hua, MD  hydrALAZINE (APRESOLINE) 25 MG tablet Take 3 tablets (75 mg total) by mouth every 8 (eight) hours. 06/22/15   Sela Hua, MD  insulin aspart (NOVOLOG FLEXPEN) 100 UNIT/ML FlexPen Inject 35 Units into the skin 3 (three) times daily with meals. 05/17/15   Zenia Resides, MD  Insulin Glargine (LANTUS SOLOSTAR) 100 UNIT/ML Solostar Pen INJECT 65 UNITS INTO THE SKIN 2 TIMES DAILY 06/26/15   Rosemarie Ax, MD  levothyroxine (SYNTHROID, LEVOTHROID) 50 MCG tablet Take 1 tablet (50 mcg total) by mouth daily before breakfast. 06/22/15   Sela Hua, MD  nitroGLYCERIN (NITROSTAT) 0.4 MG SL tablet Place 1 tablet (0.4 mg total) under the tongue every 5 (five) minutes as needed for chest pain. 06/06/15   Mercy Riding, MD  pantoprazole (PROTONIX) 40 MG tablet Take 1 tablet (40 mg total) by mouth daily. 06/06/15   Mercy Riding, MD  polyethylene glycol (MIRALAX / GLYCOLAX) packet Take 17 g by mouth daily as needed for mild constipation or moderate constipation. 06/26/15   Rosemarie Ax, MD  SURE COMFORT PEN NEEDLES 31G X 8 MM MISC USE WITH BOTH INSULIN PENS TWICE DAILY (4 PER DAY) 06/14/15   Olin Hauser, DO    Past Medical History: Past Medical History  Diagnosis Date  . Hypertension   . PAD (peripheral artery disease) ([place])   . Gangrene of toe ([place])     right 5th/notes 01/04/2013     . High cholesterol   . Type II diabetes mellitus ([place])   . 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 CHF (congestive heart failure) ([place])   . Shortness of breath dyspnea     Past Surgical History: Past Surgical History  Procedure Laterality Date  . Throat surgery  1983    polyps  . Angioplasty / stenting femoral Right 01/13/2013    superficial femoral artery x 2 (3 mm x 60 mm, 4 mm x 60 mm)  . Tonsillectomy and adenoidectomy  ~ 1965  . Amputation Right 01/28/2013    Procedure: RAY AMPUTATION RIGHT  4th & 5th TOE;  Surgeon: Sherren Mocha  Katina Dung, MD;  Location: [place];  Service: Vascular;  Laterality: Right;  . Coronary artery bypass graft N/A 04/07/2013    Procedure: CORONARY ARTERY BYPASS GRAFTING (CABG);  Surgeon: Ivin Poot, MD;  Location: [place];  Service: Open Heart Surgery;  Laterality: N/A;  . Intraoperative transesophageal echocardiogram N/A 04/07/2013    Procedure: INTRAOPERATIVE TRANSESOPHAGEAL ECHOCARDIOGRAM;  Surgeon: Ivin Poot, MD;  Location: [place];  Service: Open Heart Surgery;  Laterality: N/A;  . Lower extremity angiogram Bilateral 01/06/2013    Procedure: LOWER EXTREMITY ANGIOGRAM;  Surgeon: Conrad Kershaw, MD;  Location: [facility] CATH LAB;  Service: Cardiovascular;  Laterality: Bilateral;  . Lower extremity angiogram Right 01/13/2013    Procedure: LOWER EXTREMITY ANGIOGRAM;  Surgeon: Conrad Bixby, MD;  Location: [facility] CATH LAB;  Service: Cardiovascular;  Laterality: Right;  . Left heart catheterization with coronary angiogram N/A 04/05/2013    Procedure: LEFT HEART CATHETERIZATION WITH CORONARY ANGIOGRAM;  Surgeon: Peter M Martinique, MD;  Location: [facility] CATH LAB;  Service: Cardiovascular;  Laterality: N/A;  . Skin cancer excision  2017    Family History: Family History  Problem Relation Age of Onset  . Cancer Mother     lung cancer  . Hypertension Mother   . Cancer Sister     patient thinks it was uterine cancer  . Hypertension Sister   . Heart attack  Sister   . Stroke Neg Hx     Social History: Social History   Social History  . Marital Status: Single    Spouse Name: N/A  . Number of Children: N/A  . Years of Education: N/A   Social History Main Topics  . Smoking status: Former Smoker -- 1.00 packs/day for 46 years    Types: Cigarettes    Start date: 02/04/1967    Quit date: 02/03/2013  . Smokeless tobacco: Never Used     Comment: Doing well with quitting.  . Alcohol Use: No  . Drug Use: No  . Sexual Activity: Not Currently   Other Topics Concern  . None   Social History Narrative   Lives with sister, [name] in [place].  Retired - former Sports coach.    Allergies:  No Known Allergies  Objective:    Vital Signs:   Temp:  [98 F (36.7 C)-98.4 F (36.9 C)] 98 F (36.7 C) (05/25 1157) Pulse Rate:  [64-76] 64 (05/25 0615) Resp:  [14-31] 14 (05/25 1115) BP: (154-195)/(63-113) 165/75 mmHg (05/25 1115) SpO2:  [95 %-100 %] 98 % (05/25 1115) Weight:  [241 lb 13.5 oz (109.7 kg)] 241 lb 13.5 oz (109.7 kg) (05/25 0659) Last BM Date: 06/28/15  Weight change: Filed Weights   06/28/15 0659  Weight: 241 lb 13.5 oz (109.7 kg)    Intake/Output:   Intake/Output Summary (Last 24 hours) at 06/28/15 1212 Last data filed at 06/28/15 1012  Gross per 24 hour  Intake      0 ml  Output   1000 ml  Net  -1000 ml     Physical Exam: General:  Well appearing. No resp difficulty HEENT: normal Neck: supple. JVP 12-14 cm . Carotids 2+ bilat; no bruits. No lymphadenopathy or thyromegaly appreciated. Cor: PMI nondisplaced. Regular rate & rhythm. No rubs, gallops or murmurs appreciated Lungs: Moderately diminished basilar sounds with slightly crackles. Abdomen: Obese, soft, nontender, mildly distended. No hepatosplenomegaly. No bruits or masses. Good bowel sounds. Extremities: no cyanosis, clubbing, rash, Trace edema to knees. Neuro: alert & orientedx3, cranial nerves grossly intact.  moves all 4 extremities w/o  difficulty. Affect pleasant  Telemetry: Reviewed, NSR 80s  Labs: Basic Metabolic Panel:  Recent Labs Lab 06/22/15 0406 06/28/15 0326  NA 141 133*  K 3.7 3.5  CL 102 100*  CO2 26 24  GLUCOSE 193* 200*  BUN 39* 44*  CREATININE 2.88* 2.75*  CALCIUM 8.7* 8.2*    Liver Function Tests:  Recent Labs Lab 06/28/15 0326  AST 15  ALT 17  ALKPHOS 76  BILITOT 0.5  PROT 6.3*  ALBUMIN 2.9*   No results for input(s): LIPASE, AMYLASE in the last 168 hours. No results for input(s): AMMONIA in the last 168 hours.  CBC:  Recent Labs Lab 06/22/15 0406 06/28/15 0326  WBC 7.8 9.7  NEUTROABS  --  7.5  HGB 8.7* 7.7*  HCT 29.2* 25.4*  MCV 75.6* 75.4*  PLT 166 189    Cardiac Enzymes: No results for input(s): CKTOTAL, CKMB, CKMBINDEX, TROPONINI in the last 168 hours.  BNP: BNP (last 3 results)  Recent Labs  06/03/15 0225 06/18/15 1216 06/28/15 0320  BNP 489.2* 229.9* 467.1*    ProBNP (last 3 results) No results for input(s): PROBNP in the last 8760 hours.   CBG:  Recent Labs Lab 06/21/15 2053 06/22/15 0615 06/22/15 1123 06/22/15 1704 06/28/15 0758  GLUCAP 187* 200* 236* 174* 214*    Coagulation Studies: No results for input(s): LABPROT, INR in the last 72 hours.  Other results: EKG: NSR 75, Probable LAE, probable old anteroseptal infarct  Imaging: Dg Chest Port 1 View  06/28/2015  CLINICAL DATA:  Acute onset of shortness of breath, diaphoresis and generalized chest tightness. Decreased O2 saturation. Initial encounter. EXAM: PORTABLE CHEST 1 VIEW COMPARISON:  Chest radiograph performed 06/18/2015 FINDINGS: The lungs are well-aerated. Vascular congestion is noted. Bilateral central airspace opacities raise concern for pulmonary is edema lungs. There is no evidence of pleural effusion or pneumothorax. The cardiomediastinal silhouette is mildly enlarged. The patient is status post median sternotomy, with evidence of prior CABG. No acute osseous abnormalities  are seen. IMPRESSION: Vascular congestion and mild cardiomegaly. Bilateral central airspace opacities raise concern for pulmonary edema. Electronically Signed   By: Garald Balding M.D.   On: 06/28/2015 03:44      Medications:     Current Medications: . amLODipine  10 mg Oral Daily  . aspirin EC  81 mg Oral Daily  . atorvastatin  40 mg Oral q1800  . carvedilol  50 mg Oral BID WC  . doxazosin  1 mg Oral QHS  . furosemide  40 mg Intravenous BID  . heparin  5,000 Units Subcutaneous Q8H  . hydrALAZINE  75 mg Oral Q8H  . insulin aspart  0-15 Units Subcutaneous TID WC  . insulin aspart  0-5 Units Subcutaneous QHS  . insulin glargine  30 Units Subcutaneous BID  . levothyroxine  50 mcg Oral QAC breakfast  . Living Better with Heart Failure Book   Does not apply Once  . pantoprazole  40 mg Oral Daily  . sodium chloride flush  3 mL Intravenous Q12H     Infusions:      Assessment/Plan   Leonard [name] is a 65 y.o. male with history of uncontrolled HTN, 123456, Chronic diastolic CHF Echo A999333 LVEF 60-65% with grade 2 DD and normal RV. 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 Presented with SOB, CP, and edema along with hypertensive urgency.  HF team consulted with recent admission and need  for med adjustment.  1. Acute on Chronic Diastolic CHF - Echo A999333 LVEF 60-65% with grade 2 DD and normal RV - Remains about 10 lbs from perceived baseline.  - Increase lasix to 80 mg IV BID.  Follow creatinine closely with CKD.  2. HTN Urgency - Pressures as high as 195/113 this am. - Increase hydralazine to 100 mg TID. Continue current coreg and amlodipine.  His doesn't seem to have much swelling in his legs, so may be OK to continue amlodipine.  - Add Imdur 60 mg daily, especially given occasional exertional CP.  - Renal US with left renal cortical irregularity (possibly scarring) and 2.5 cm simple cyst, but otherwise unremarkable. - Renal artery  dopplers to look for renal artery stenosis.  - TSH 12.559 on 06/18/15. Started on Synthroid.  - Add hydralazine 10 mg IV prn for pressures 3. CKD IIIB - Improved from previous admission. Follow closely with diuresis 4. CAD s/p CABG 04/2013 - He has occasional CP relieved by Nitro.  With decompensations may benefit from ischemic work up.  Recent Echo with normal EF. - Continue ASA and Statin. - Meds as above.  - Add Imdur 60 mg daily.  5. Mild OSA - AHI 4.6 in 11/2013. Did not qualify for CPAP titration. 6. PAD s/p stent to right femoral artery 01/2013.  Large pressure differential between arms and legs => will get peripheral arterial doppler evaluation.  6. HLD - Continue atorvastatin 7. COPD - Per primary  Length of Stay: 0  Shirley Friar PA-C 06/28/2015, 12:12 PM  Advanced Heart Failure Team Pager [phone] (M-F; 7a - 4p)  Please contact [facility] for night-coverage after hours (4p -7a ) and weekends on [email or web address]  Patient seen with PA, agree with the above note.  1. Acute on chronic diastolic CHF: Suspect potentiated by HTN and CKD.  He is volume overloaded on exam with weight gain at home.  - Lasix 80 mg IV bid and follow response.  2. Hypertensive urgency: Needs better HTN management.  Will continue Coreg, amlodipine, doxazosin.  Increase hydralazine to 100 mg tid. Check renal artery dopplers.   3. CKD Stage III-IV: This makes BP control and CHF control worse.  Follow closely with diuresis.  Sees nephrology as outpatient.  4. CAD: s/p CABG.  Rare exertional chest pain.  Dyspnea is a much more prominent symptom.  Doubt ACS.  Negative cardiac enzymes.  Will add Imdur 60 mg daily.   5. PAD: Large pressure differential between arms and legs.  Will arrange for peripheral arterial doppler evaluation.   Loralie Champagne 06/28/2015 1:53 PM

## 2015-06-29 ENCOUNTER — Inpatient Hospital Stay (HOSPITAL_COMMUNITY): Payer: Medicaid Other

## 2015-06-29 ENCOUNTER — Encounter: Payer: Medicaid Other | Admitting: Cardiology

## 2015-06-29 DIAGNOSIS — I739 Peripheral vascular disease, unspecified: Secondary | ICD-10-CM

## 2015-06-29 DIAGNOSIS — I160 Hypertensive urgency: Secondary | ICD-10-CM

## 2015-06-29 LAB — CBC
HCT: 22.1 % — ABNORMAL LOW (ref 39.0–52.0)
HCT: 24.5 % — ABNORMAL LOW (ref 39.0–52.0)
HEMOGLOBIN: 7.4 g/dL — AB (ref 13.0–17.0)
Hemoglobin: 6.8 g/dL — CL (ref 13.0–17.0)
MCH: 22.7 pg — AB (ref 26.0–34.0)
MCH: 22.4 pg — ABNORMAL LOW (ref 26.0–34.0)
MCHC: 30.8 g/dL (ref 30.0–36.0)
MCHC: 30.2 g/dL (ref 30.0–36.0)
MCV: 73.7 fL — AB (ref 78.0–100.0)
MCV: 74 fL — ABNORMAL LOW (ref 78.0–100.0)
PLATELETS: 200 10*3/uL (ref 150–400)
Platelets: 221 10*3/uL (ref 150–400)
RBC: 3 MIL/uL — AB (ref 4.22–5.81)
RBC: 3.31 MIL/uL — ABNORMAL LOW (ref 4.22–5.81)
RDW: 19.8 % — AB (ref 11.5–15.5)
RDW: 19.9 % — AB (ref 11.5–15.5)
WBC: 7.8 10*3/uL (ref 4.0–10.5)
WBC: 9.5 10*3/uL (ref 4.0–10.5)

## 2015-06-29 LAB — BASIC METABOLIC PANEL
Anion gap: 10 (ref 5–15)
BUN: 59 mg/dL — AB (ref 6–20)
CO2: 23 mmol/L (ref 22–32)
Calcium: 8.4 mg/dL — ABNORMAL LOW (ref 8.9–10.3)
Chloride: 102 mmol/L (ref 101–111)
Creatinine, Ser: 2.84 mg/dL — ABNORMAL HIGH (ref 0.61–1.24)
GFR calc Af Amer: 25 mL/min — ABNORMAL LOW (ref >60)
GFR, EST NON AFRICAN AMERICAN: 22 mL/min — AB (ref >60)
GLUCOSE: 334 mg/dL — AB (ref 65–99)
POTASSIUM: 3.9 mmol/L (ref 3.5–5.1)
Sodium: 135 mmol/L (ref 135–145)

## 2015-06-29 LAB — GLUCOSE, CAPILLARY
GLUCOSE-CAPILLARY: 254 mg/dL — AB (ref 65–99)
Glucose-Capillary: 303 mg/dL — ABNORMAL HIGH (ref 65–99)
Glucose-Capillary: 261 mg/dL — ABNORMAL HIGH (ref 65–99)
Glucose-Capillary: 232 mg/dL — ABNORMAL HIGH (ref 65–99)

## 2015-06-29 LAB — TYPE AND SCREEN
ABO/RH(D): B NEG
Antibody Screen: NEGATIVE

## 2015-06-29 MED ORDER — INSULIN GLARGINE 100 UNIT/ML ~~LOC~~ SOLN
40.0000 [IU] | Freq: Two times a day (BID) | SUBCUTANEOUS | Status: DC
  Administered 2015-06-29 – 2015-07-02 (×7): 40 [IU] via SUBCUTANEOUS
  Filled 2015-06-29 (×9): qty 0.4

## 2015-06-29 MED ORDER — METOLAZONE 2.5 MG PO TABS
2.5000 mg | ORAL_TABLET | Freq: Once | ORAL | Status: AC
  Administered 2015-06-29: 2.5 mg via ORAL
  Filled 2015-06-29: qty 1

## 2015-06-29 MED ORDER — DOXAZOSIN MESYLATE 1 MG PO TABS
1.0000 mg | ORAL_TABLET | Freq: Two times a day (BID) | ORAL | Status: DC
  Administered 2015-06-29 – 2015-07-01 (×6): 1 mg via ORAL
  Filled 2015-06-29 (×7): qty 1

## 2015-06-29 MED ORDER — DOXAZOSIN MESYLATE 4 MG PO TABS: 2.0000 mg | ORAL_TABLET | Freq: Two times a day (BID) | ORAL | Status: DC

## 2015-06-29 NOTE — Progress Notes (Signed)
Family Medicine Teaching Service Daily Progress Note Intern Pager: [phone]  [name] Medical record number: [id number] Date of birth: 10-08-1950 Age: 65 y.o. Gender: male  Primary Care Provider: Clearance Coots, MD Consultants: HF team Code Status: Full  Pt Overview and Major Events to Date:  5/25: Admitted to [facility] with CHF exacerbation  Assessment and Plan: Leonard [name] is a 65 y.o. male presenting with shortness of breath. 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  CHF exacerbation: Likely 2/2 non-compliance. On Lasix 80mg  bid at home. Weaned from BiPAP on admission to 2.5L O2 by [place]. Weight decreased from 241lb on admission to 236lb this morning. Troponins negative x 3. Cr bumped from 2.74 to 2.84. Urine output 1.3L. On exam, has trace edema in the lower extremities and mild bibasilar crackles. - Wean O2 as tolerated - HF team consult. Recommend Lasix 80mg  IV bid. Add Metolazone 2.5mg  daily. Increase Hydralazine 100mg  tid. Increase Doxazosin to 1mg  bid. Check renal artery dopplers and peripheral arterial dopplers. Add Imdur 60mg  daily. - Discuss increased Doxazosin dose with HF team, as he had significant orthostatic hypotension last admission. - Will measure all BPs while patient is standing. - Albuterol prn - Daily weights - Strict I/O's - Cardiac monitoring - PT/OT consulted. PT signed off. - Casimer Lanius (SW) made referral to [facility]. - Transfer out of stepdown unit today  Uncontrolled HTN: BPs improved to 157/63 this morning. - Continue home meds: Norvasc 10mg  daily, Doxazosin 1mg  bid, Hydralazine 100mg  tid, Coreg 50mg  bid. Imdur 60mg  daily added by HF team. - Will measure all BPs while patient is standing  Type 2 DM: Poorly controlled. A1c 10.5%. On Lantus 65 units bid and Novolog 35 units tid with meals at home. CBGs 214-365. Received 34  units of Novolog in the last 24 hours. - Will increase Lantus from 30 units bid to 40 units bid. Continue moderate SSI.  - CBGs four times a day  Microcytic Anemia: Likely iron deficiency in combination with anemia of chronic disease. Iron panel 06/04/2015: Iron 24, ferritin 44, TIBC 405. Hgb 7.7 on admission > 6.8 this morning. No dizziness, no chest pain, no palpitations. - FOBT positive. Will get GI consult. Pt has never had a colonoscopy. - Takes Aranesp q2 weeks - Daily CBCs - Will repeat H/H this morning. If still < 7, will give 1 unit of blood, given significant cardiac history. Plan to follow blood transfusion with 40mg  IV Lasix. Believe benefits of transfusion outweigh risks.  CKD IIIB: Cr 2.75 > 2.84. Baseline 2.5-2.7. - Recommend Nephrology outpatient follow-up  Hypothyroidism: TSH 12.559 last admission. - Continue Synthroid 36mcg daily  CAD s/p CABG x 3 / PAD / HLD:  - Continue home Lipitor, Coreg, ASA  FEN/GI: Heart healthy carb-modified diet, Protonix 40mg  daily Prophylaxis: Heparin sq  Disposition: Pending adequate diuresis.  Subjective:  Pt states he is doing well this morning. His shortness of breath is improving. He denies any chest pain. No dizziness with standing, no palpitations.  Objective: Temp:  [97.8 F (36.6 C)-98.5 F (36.9 C)] 98.3 F (36.8 C) (05/26 0331) Resp:  [13-31] 19 (05/26 0000) BP: (152-196)/(57-113) 165/83 mmHg (05/26 0000) SpO2:  [90 %-100 %] 94 % (05/26 0000) Weight:  [236 lb 0.6 oz (107.067 kg)-241 lb 13.5 oz (109.7 kg)] 236 lb 0.6 oz (107.067 kg) (05/26 0331) Physical Exam: General: Well-appearing, sitting up in chair, in  NAD, talkative HEENT: EOMI, no scleral icterus, MMM Neck: Supple, no JVD Cardiovascular: RRR, no murmurs, rubs or gallop. 2+ radial pulses bilaterally  Respiratory: mild bibasilar crackles, normal work of breathing Abdomen: +BS, soft, non-tender, non-distended MSK: Trace lower extremity edema Skin: No rashes or  lesions Neuro: Awake, alert, oriented, CN 2-12 grossly intact, no focal deficits Psych: Appropriate affect, normal behavior  Laboratory:  Recent Labs Lab 06/28/15 0326 06/29/15 0238  WBC 9.7 7.8  HGB 7.7* 6.8*  HCT 25.4* 22.1*  PLT 189 200    Recent Labs Lab 06/28/15 0326 06/28/15 1337 06/28/15 1629 06/29/15 0238  NA 133* 134* 133* 135  K 3.5 4.0 4.4 3.9  CL 100* 101 100* 102  CO2 24 22 20* 23  BUN 44* 50* 53* 59*  CREATININE 2.75* 2.74* 2.81* 2.84*  CALCIUM 8.2* 8.6* 8.6* 8.4*  PROT 6.3*  --   --   --   BILITOT 0.5  --   --   --   ALKPHOS 76  --   --   --   ALT 17  --   --   --   AST 15  --   --   --   GLUCOSE 200* 310* 362* 334*   BNP 467 i-stat troponin: 0.02 Trop: 0.03 > 0.04 > <0.03  Imaging/Diagnostic Tests: CXR (5/25): Vascular congestion and mild cardiomegaly. Bilateral central airspace opacities, concerning for pulmonary edema  Sela Hua, MD 06/29/2015, 6:56 AM PGY-1, [place] Intern pager: [phone], text pages welcome

## 2015-06-29 NOTE — Evaluation (Signed)
Occupational Therapy Evaluation Patient Details Name: Leonard [name] MRN: [id number] DOB: 09/19/1950 Today's Date: 06/29/2015    History of Present Illness 65 yo M with history of CKD, CAD, diastolic CHF, type 2 diabetes, hyperlipidemia, PVD, COPD, hypertension presenting with shortness of breath and hypoxia. CXR, labs and vitals point to CHF as the cause.    Clinical Impression   Pt admitted with above. He demonstrates the below listed deficits and will benefit from continued OT to maximize safety and independence with BADLs.  Pt presents to OT with generalized weakness.  He fatigues with ADLs thus requiring min A to complete.  He has AE at home.  Feel he would benefit from acute OT to maximize independence with ADLs.  Recommend [facility] at discharge.       Follow Up Recommendations  Home health OT;Supervision/Assistance - 24 hour;Other (comment) [facility] )    Equipment Recommendations  None recommended by OT    Recommendations for Other Services       Precautions / Restrictions Precautions Precautions: Fall Precaution Comments: Most recent fall was last October in his apartment      Mobility Bed Mobility Overal bed mobility: Modified Independent                Transfers Overall transfer level: Modified independent Equipment used: Rolling walker (2 wheeled)                  Balance Overall balance assessment: Needs assistance Sitting-balance support: Feet supported Sitting balance-Leahy Scale: Good     Standing balance support: During functional activity;Single extremity supported Standing balance-Leahy Scale: Fair                              ADL Overall ADL's : Needs assistance/impaired Eating/Feeding: Independent;Sitting   Grooming: Wash/dry hands;Wash/dry face;Oral care;Brushing hair;Supervision/safety;Standing   Upper Body Bathing: Set up;Sitting   Lower Body Bathing: Minimal assistance;Sit to/from stand   Upper Body Dressing :  Set up;Sitting   Lower Body Dressing: Minimal assistance;Sit to/from stand;Cueing for safety   Toilet Transfer: Supervision/safety   Toileting- Clothing Manipulation and Hygiene: Supervision/safety;Sit to/from stand       Functional mobility during ADLs: Supervision/safety;Rolling walker General ADL Comments: Pt requires min A due to fatigue      Vision Vision Assessment?: No apparent visual deficits   Perception     Praxis      Pertinent Vitals/Pain Pain Assessment: No/denies pain     Hand Dominance Right   Extremity/Trunk Assessment Upper Extremity Assessment Upper Extremity Assessment: Generalized weakness   Lower Extremity Assessment Lower Extremity Assessment: Defer to PT evaluation       Communication Communication Communication: No difficulties   Cognition Arousal/Alertness: Awake/alert Behavior During Therapy: WFL for tasks assessed/performed Overall Cognitive Status: Within Functional Limits for tasks assessed                     General Comments       Exercises       Shoulder Instructions      Home Living Family/patient expects to be discharged to:: Private residence Living Arrangements: Other relatives (sister) Available Help at Discharge: Family;Available 24 hours/day (sister) Type of Home: Apartment Home Access: Level entry     Home Layout: One level     Bathroom Shower/Tub: Tub/shower unit Shower/tub characteristics: Curtain Biochemist, clinical: Handicapped height     Home Equipment: Clinical cytogeneticist - 2 wheels;Adaptive equipment Adaptive Equipment: Reacher;Sock aid;Long-handled  shoe horn;Long-handled sponge        Prior Functioning/Environment Level of Independence: Needs assistance  Gait / Transfers Assistance Needed: Pt walks with walker indoors and outdoors.  Fatigues quickly. Reports this is new since last admission ADL's / Homemaking Assistance Needed: Pt has aid 3x week to assist with shower.  Pt is able to assist  with bathing but fatigues so quickly with bathing and dressing that often times cannot complete the tasks before lunch time.        OT Diagnosis: Generalized weakness   OT Problem List: Decreased strength;Decreased activity tolerance;Decreased knowledge of use of DME or AE   OT Treatment/Interventions: Self-care/ADL training;Therapeutic exercise;DME and/or AE instruction;Therapeutic activities;Patient/family education    OT Goals(Current goals can be found in the care plan section) Acute Rehab OT Goals Patient Stated Goal: go home. OT Goal Formulation: With patient Time For Goal Achievement: 07/13/15 Potential to Achieve Goals: Good ADL Goals Pt Will Perform Upper Body Bathing: with modified independence;sitting Pt Will Perform Lower Body Bathing: with modified independence;sit to/from stand;with adaptive equipment Pt Will Perform Upper Body Dressing: with modified independence;sitting Pt Will Perform Lower Body Dressing: with modified independence;with adaptive equipment;sit to/from stand Pt Will Transfer to Toilet: with modified independence;ambulating;regular height toilet;bedside commode;grab bars Pt Will Perform Toileting - Clothing Manipulation and hygiene: with modified independence;sit to/from stand Additional ADL Goal #1: Pt will actively participate in 25 mins therapeutic activity with two rest breaks  Additional ADL Goal #2: Pt will bene independent with theraband exercise program to increase bil. UE strength   OT Frequency: Min 2X/week   Barriers to D/C:            Co-evaluation              End of Session Equipment Utilized During Treatment: Surveyor, mining Communication: Mobility status  Activity Tolerance: Patient limited by fatigue Patient left: in chair;with call bell/phone within reach   Time: 1547-1618 OT Time Calculation (min): 31 min Charges:  OT General Charges $OT Visit: 1 Procedure OT Evaluation $OT Eval Moderate Complexity: 1  Procedure OT Treatments $Therapeutic Activity: 8-22 mins G-Codes:    Asyah Candler M 22-Jul-2015, 8:07 PM

## 2015-06-29 NOTE — Progress Notes (Signed)
VASCULAR LAB PRELIMINARY  ARTERIAL  ABI completed: Right sided ABI is suggestive of severe arterial insufficiency. Left sided ABI is suggested of moderate arterial insufficiency disease at rest.     RIGHT    LEFT    PRESSURE WAVEFORM  PRESSURE WAVEFORM  BRACHIAL 167 Triphasic BRACHIAL 172 Triphasic  DP Non Comp monophasic DP 98 monophasic  PT 83 monophasic PT 90 monophasic  PER   PER    GREAT TOE  NA GREAT TOE  NA    RIGHT LEFT  ABI 0.48 0.57     Angelica Wix D, RVT 06/29/2015, 11:57 AM

## 2015-06-29 NOTE — Progress Notes (Signed)
CRITICAL VALUE ALERT  Critical value received:  Hbg 6.8  Date of notification:  06/29/2015  Time of notification:  3:37 AM  Critical value read back:Yes.    Nurse who received alert:  Paulene Floor  MD notified (1st page):  Family medicine  Time of first page:  3:37 AM

## 2015-06-29 NOTE — Progress Notes (Signed)
Leonard [name] w [facility] came by. Pt is act w [facility] for hhrn.

## 2015-06-29 NOTE — Progress Notes (Addendum)
Advanced Heart Failure Rounding Note  Referring Physician: Dr. Ardelia Mems Primary Physician: Clearance Coots, MD Primary Nephrologist: [facility] Primary Cardiologist: Previously Mare Ferrari  Reason for Consultation: A/C diastolic HF  Subjective:    Hgb 6.8 this am. Gets Aranesp every 2 weeks.  No overt bleeding.   Feeling OK this morning. Hasn't noticed any blood.  Says his urine is being recorded. Was weighed standing this morning, isn't sure about yesterday.   Out 1.3 L and down 5 lbs.   No intake recorded.  Creatinine stable.  Objective:   Weight Range: 236 lb 0.6 oz (107.067 kg) Body mass index is 35.9 kg/(m^2).   Vital Signs:   Temp:  [97.8 F (36.6 C)-98.5 F (36.9 C)] 98.3 F (36.8 C) (05/26 0331) Resp:  [13-29] 16 (05/26 0400) BP: (152-196)/(57-113) 157/63 mmHg (05/26 0400) SpO2:  [90 %-100 %] 96 % (05/26 0400) Weight:  [236 lb 0.6 oz (107.067 kg)] 236 lb 0.6 oz (107.067 kg) (05/26 0331) Last BM Date: 06/28/15  Weight change: Filed Weights   06/28/15 0659 06/29/15 0331  Weight: 241 lb 13.5 oz (109.7 kg) 236 lb 0.6 oz (107.067 kg)    Intake/Output:   Intake/Output Summary (Last 24 hours) at 06/29/15 0752 Last data filed at 06/29/15 0400  Gross per 24 hour  Intake      0 ml  Output   1325 ml  Net  -1325 ml     Physical Exam: General: Well appearing. No resp difficulty HEENT: normal Neck: supple. JVP 12+ cm . Carotids 2+ bilat; no bruits. No thyromegaly or nodule noted Cor: PMI nondisplaced. RRR. No rubs, gallops or murmurs appreciated Lungs: Slightly diminished throughout Abdomen: Obese, soft, NT, mildly distended. No hepatosplenomegaly. No bruits or masses. Good bowel sounds. Extremities: no cyanosis, clubbing, rash, Trace edema to knees. Neuro: alert & orientedx3, cranial nerves grossly intact. moves all 4 extremities w/o difficulty. Affect pleasant  Telemetry: Reviewed, NSR 80s  Labs: CBC  Recent Labs  06/28/15 0326 06/29/15 0238  WBC  9.7 7.8  NEUTROABS 7.5  --   HGB 7.7* 6.8*  HCT 25.4* 22.1*  MCV 75.4* 73.7*  PLT 189 [id number]   Basic Metabolic Panel  Recent Labs  06/28/15 1629 06/29/15 0238  NA 133* 135  K 4.4 3.9  CL 100* 102  CO2 20* 23  GLUCOSE 362* 334*  BUN 53* 59*  CREATININE 2.81* 2.84*  CALCIUM 8.6* 8.4*   Liver Function Tests  Recent Labs  06/28/15 0326  AST 15  ALT 17  ALKPHOS 76  BILITOT 0.5  PROT 6.3*  ALBUMIN 2.9*   No results for input(s): LIPASE, AMYLASE in the last 72 hours. Cardiac Enzymes  Recent Labs  06/28/15 1337 06/28/15 1905  TROPONINI 0.04* <0.03    BNP: BNP (last 3 results)  Recent Labs  06/03/15 0225 06/18/15 1216 06/28/15 0320  BNP 489.2* 229.9* 467.1*    ProBNP (last 3 results) No results for input(s): PROBNP in the last 8760 hours.   D-Dimer No results for input(s): DDIMER in the last 72 hours. Hemoglobin A1C No results for input(s): HGBA1C in the last 72 hours. Fasting Lipid Panel No results for input(s): CHOL, HDL, LDLCALC, TRIG, CHOLHDL, LDLDIRECT in the last 72 hours. Thyroid Function Tests No results for input(s): TSH, T4TOTAL, T3FREE, THYROIDAB in the last 72 hours.  Invalid input(s): FREET3  Other results:     Imaging/Studies:  Dg Chest Port 1 View  06/28/2015  CLINICAL DATA:  Acute onset of shortness of breath, diaphoresis  and generalized chest tightness. Decreased O2 saturation. Initial encounter. EXAM: PORTABLE CHEST 1 VIEW COMPARISON:  Chest radiograph performed 06/18/2015 FINDINGS: The lungs are well-aerated. Vascular congestion is noted. Bilateral central airspace opacities raise concern for pulmonary is edema lungs. There is no evidence of pleural effusion or pneumothorax. The cardiomediastinal silhouette is mildly enlarged. The patient is status post median sternotomy, with evidence of prior CABG. No acute osseous abnormalities are seen. IMPRESSION: Vascular congestion and mild cardiomegaly. Bilateral central airspace opacities  raise concern for pulmonary edema. Electronically Signed   By: Garald Balding M.D.   On: 06/28/2015 03:44     Latest Echo  Latest Cath   Medications:     Scheduled Medications: . amLODipine  10 mg Oral Daily  . aspirin EC  81 mg Oral Daily  . atorvastatin  40 mg Oral q1800  . carvedilol  50 mg Oral BID WC  . doxazosin  1 mg Oral QHS  . furosemide  80 mg Intravenous BID  . heparin  5,000 Units Subcutaneous Q8H  . hydrALAZINE  100 mg Oral Q8H  . insulin aspart  0-15 Units Subcutaneous TID WC  . insulin aspart  0-5 Units Subcutaneous QHS  . insulin glargine  40 Units Subcutaneous BID  . isosorbide mononitrate  60 mg Oral Daily  . levothyroxine  50 mcg Oral QAC breakfast  . Living Better with Heart Failure Book   Does not apply Once  . pantoprazole  40 mg Oral Daily  . sodium chloride flush  3 mL Intravenous Q12H     Infusions:     PRN Medications:  sodium chloride, acetaminophen, albuterol, hydrALAZINE, ondansetron (ZOFRAN) IV, sodium chloride flush   Assessment/Plan   Leonard [name] is a 65 y.o. male with history of uncontrolled HTN, [id number], Chronic diastolic CHF Echo [id number] LVEF 60-65% with grade 2 DD and normal RV. 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 Presented with SOB, CP, and edema along with hypertensive urgency. HF team consulted with recent admission and need for med adjustment.  1. Acute on chronic diastolic CHF: Suspect potentiated by HTN and CKD. He is still volume overloaded on exam with weight gain at home. Hard to tell how much he diuresed yesterday, UOP not marked. - Continue lasix 80 mg IV bid. Add metolazone 2.5 mg this am.  2. Hypertensive urgency:  - Continue Coreg, amlodipine. - Hydralazine increased to 100 mg tid.  - BP remains high, increase doxazosin to 1 mg bid.  - Check renal artery dopplers.  3. CKD Stage III-IV: This makes BP control and CHF control worse. Follow closely with diuresis.  Sees nephrology as outpatient.  4. CAD: s/p CABG. Rare exertional chest pain. Dyspnea is a much more prominent symptom. Doubt ACS. Negative cardiac enzymes. We added Imdur 60 mg daily.  5. PAD: Large pressure differential between arms and legs. Will arrange for peripheral arterial doppler evaluation.  6. Anemia: ?Chronic disease/renal disease.  No overt bleeding. - Per primary. Hgb trending down. 7.7 -> 6.8 - Likely needs a unit of blood.  Will leave to Family Med  Length of Stay: 1   Shirley Friar PA-C 06/29/2015, 7:52 AM  Advanced Heart Failure Team Pager [phone] (M-F; 7a - 4p)  Please contact [facility] for night-coverage after hours (4p -7a ) and weekends on [email or web address]  Patient seen with PA, agree with the above note.  Hard to tell how much he has diuresed, UOP that was recorded  was not much.  He is still volume overloaded.  Would add a dose of metolazone today.   Awaiting renal artery doppler evaluation and peripheral arterial doppler evaluation.   Hgb 6.8 today, would favor transfusion of 1 unit to keep hgb > 7.   Loralie Champagne 06/29/2015 8:09 AM

## 2015-06-29 NOTE — Progress Notes (Signed)
Preliminary results by tech - Renal Duplex Completed. The renal arteries are patent with no evidence of a significant stenosis noted, however, bnormal infrarenal resistive indices were noted bilaterally. 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 Leonard [name], BS, RDMS, RVT

## 2015-06-29 NOTE — Consult Note (Signed)
Referring Provider: Dr. Ardelia Mems Primary Care Physician:  Clearance Coots, MD Primary Gastroenterologist:  Althia Forts  Reason for Consultation:  Anemia; Heme positive stool  HPI: Leonard [name] is a 65 y.o. male with multiple medical problems being managed for a CHF exacerbation being seen for a consultation due to anemia and heme positive stool. Denies abdominal pain, nausea, vomiting, heartburn, melena, or hematochezia. Has never had a colonoscopy. Hgb 7.4 with MCV 74. Ferritin 44, iron 24. Iron sat 6%. Denies NSAIDs (except on Aspirin 81 mg/day).    Past Medical History  Diagnosis Date  . Hypertension   . PAD (peripheral artery disease) ([place])   . Gangrene of toe ([place])     right 5th/notes 01/04/2013   . High cholesterol   . Type II diabetes mellitus ([place])   . 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 CHF (congestive heart failure) ([place])   . Shortness of breath dyspnea     Past Surgical History  Procedure Laterality Date  . Throat surgery  1983    polyps  . Angioplasty / stenting femoral Right 01/13/2013    superficial femoral artery x 2 (3 mm x 60 mm, 4 mm x 60 mm)  . Tonsillectomy and adenoidectomy  ~ 1965  . Amputation Right 01/28/2013    Procedure: RAY AMPUTATION RIGHT  4th & 5th TOE;  Surgeon: Rosetta Posner, MD;  [facility];  Service: Vascular;  Laterality: Right;  . Coronary artery bypass graft N/A 04/07/2013    Procedure: CORONARY ARTERY BYPASS GRAFTING (CABG);  Surgeon: Ivin Poot, MD;  Location: [place];  Service: Open Heart Surgery;  Laterality: N/A;  . Intraoperative transesophageal echocardiogram N/A 04/07/2013    Procedure: INTRAOPERATIVE TRANSESOPHAGEAL ECHOCARDIOGRAM;  Surgeon: Ivin Poot, MD;  Location: [place];  Service: Open Heart Surgery;  Laterality: N/A;  . Lower extremity angiogram Bilateral 01/06/2013    Procedure: LOWER EXTREMITY ANGIOGRAM;  Surgeon: Conrad Pueblo, MD;  Location: [facility] CATH LAB;  Service: Cardiovascular;   Laterality: Bilateral;  . Lower extremity angiogram Right 01/13/2013    Procedure: LOWER EXTREMITY ANGIOGRAM;  Surgeon: Conrad [place], MD;  Location: [facility] CATH LAB;  Service: Cardiovascular;  Laterality: Right;  . Left heart catheterization with coronary angiogram N/A 04/05/2013    Procedure: LEFT HEART CATHETERIZATION WITH CORONARY ANGIOGRAM;  Surgeon: Peter M Martinique, MD;  Location: [facility] C[facility] LAB;  Service: Cardiovascular;  Laterality: N/A;  . Skin cancer excision  2017    Prior to Admission medications   Medication Sig Start Date End Date Taking? Authorizing Provider  acetaminophen (TYLENOL) 500 MG tablet Take 1 tablet (500 mg total) by mouth every 6 (six) hours as needed for mild pain or moderate pain. 06/06/15  Yes Mercy Riding, MD  amLODipine (NORVASC) 10 MG tablet Take 1 tablet (10 mg total) by mouth daily. 03/23/15  Yes Zenia Resides, MD  aspirin EC 81 MG EC tablet Take 1 tablet (81 mg total) by mouth daily. 06/06/15  Yes Mercy Riding, MD  atorvastatin (LIPITOR) 40 MG tablet TAKE 1 TABLET BY MOUTH EVERY DAY AT 6 PM 03/23/15  Yes Zenia Resides, MD  carvedilol (COREG) 25 MG tablet Take 2 tablets (50 mg total) by mouth 2 (two) times daily with a meal. 04/05/15  Yes Zenia Resides, MD  furosemide (LASIX) 40 MG tablet Take 2 tablets (80 mg total) by mouth 2 (two) times daily. 06/22/15  Yes Sela Hua, MD  hydrALAZINE (APRESOLINE) 25 MG tablet  Take 3 tablets (75 mg total) by mouth every 8 (eight) hours. 06/22/15  Yes Sela Hua, MD  insulin aspart (NOVOLOG FLEXPEN) 100 UNIT/ML FlexPen Inject 35 Units into the skin 3 (three) times daily with meals. 05/17/15  Yes Zenia Resides, MD  Insulin Glargine (LANTUS SOLOSTAR) 100 UNIT/ML Solostar Pen INJECT 65 UNITS INTO THE SKIN 2 TIMES DAILY 06/26/15  Yes Rosemarie Ax, MD  levothyroxine (SYNTHROID, LEVOTHROID) 50 MCG tablet Take 1 tablet (50 mcg total) by mouth daily before breakfast. 06/22/15  Yes Sela Hua, MD  nitroGLYCERIN (NITROSTAT) 0.4 MG  SL tablet Place 1 tablet (0.4 mg total) under the tongue every 5 (five) minutes as needed for chest pain. 06/06/15  Yes Mercy Riding, MD  SURE COMFORT PEN NEEDLES 31G X 8 MM MISC USE WITH BOTH INSULIN PENS TWICE DAILY (4 PER DAY) 06/14/15  Yes Olin Hauser, DO  ADVAIR DISKUS 250-50 MCG/DOSE AEPB INHALE 1 PUFF INTO THE LUNGS 2 TIMES DAILY Patient not taking: Reported on 06/28/2015 06/25/15   Rosemarie Ax, MD  albuterol (PROVENTIL HFA;VENTOLIN HFA) 108 (90 Base) MCG/ACT inhaler Inhale 2 puffs into the lungs every 6 (six) hours as needed for wheezing or shortness of breath. Patient not taking: Reported on 06/28/2015 06/12/15   Rogue Bussing, MD  doxazosin (CARDURA) 1 MG tablet Take 1 tablet (1 mg total) by mouth daily. 06/22/15   Sela Hua, MD  pantoprazole (PROTONIX) 40 MG tablet Take 1 tablet (40 mg total) by mouth daily. 06/06/15   Mercy Riding, MD    Scheduled Meds: . amLODipine  10 mg Oral Daily  . aspirin EC  81 mg Oral Daily  . atorvastatin  40 mg Oral q1800  . carvedilol  50 mg Oral BID WC  . doxazosin  1 mg Oral Q12H  . furosemide  80 mg Intravenous BID  . hydrALAZINE  100 mg Oral Q8H  . insulin aspart  0-15 Units Subcutaneous TID WC  . insulin aspart  0-5 Units Subcutaneous QHS  . insulin glargine  40 Units Subcutaneous BID  . isosorbide mononitrate  60 mg Oral Daily  . levothyroxine  50 mcg Oral QAC breakfast  . Living Better with Heart Failure Book   Does not apply Once  . pantoprazole  40 mg Oral Daily  . sodium chloride flush  3 mL Intravenous Q12H   Continuous Infusions:  PRN Meds:.sodium chloride, acetaminophen, albuterol, hydrALAZINE, ondansetron (ZOFRAN) IV, sodium chloride flush  Allergies as of 06/28/2015  . (No Known Allergies)    Family History  Problem Relation Age of Onset  . Cancer Mother     lung cancer  . Hypertension Mother   . Cancer Sister     patient thinks it was uterine cancer  . Hypertension Sister   . Heart attack Sister    . Stroke Neg Hx     Social History   Social History  . Marital Status: Single    Spouse Name: N/A  . Number of Children: N/A  . Years of Education: N/A   Occupational History  . Not on file.   Social History Main Topics  . Smoking status: Former Smoker -- 1.00 packs/day for 46 years    Types: Cigarettes    Start date: 02/04/1967    Quit date: 02/03/2013  . Smokeless tobacco: Never Used     Comment: Doing well with quitting.  . Alcohol Use: No  . Drug Use: No  . Sexual Activity: Not  Currently   Other Topics Concern  . Not on file   Social History Narrative   Lives with sister, [name] in [place].  Retired - former Sports coach.    Review of Systems: All negative except as stated above in HPI.  Physical Exam: Vital signs: Filed Vitals:   06/29/15 1300 06/29/15 1353  BP: 162/62 151/68  Pulse:  67  Temp:  98 F (36.7 C)  Resp: 18 16   Last BM Date: 06/28/15 General:  Lethargic, Well-developed, well-nourished, pleasant and cooperative in NAD Head: atraumatic Eyes: anicteric sclera ENT: oropharynx clear Lungs:  Clear throughout to auscultation.   No wheezes, crackles, or rhonchi. No acute distress. Heart:  Regular rate and rhythm; no murmurs, clicks, rubs,  or gallops. Abdomen: soft, nontender, nondistended, +BS  Rectal:  Deferred Ext: no edema Psych: normal mood, affect  GI:  Lab Results:  Recent Labs  06/28/15 0326 06/29/15 0238 06/29/15 1106  WBC 9.7 7.8 9.5  HGB 7.7* 6.8* 7.4*  HCT 25.4* 22.1* 24.5*  PLT 189 200 221   BMET  Recent Labs  06/28/15 1337 06/28/15 1629 06/29/15 0238  NA 134* 133* 135  K 4.0 4.4 3.9  CL 101 100* 102  CO2 22 20* 23  GLUCOSE 310* 362* 334*  BUN 50* 53* 59*  CREATININE 2.74* 2.81* 2.84*  CALCIUM 8.6* 8.6* 8.4*   LFT  Recent Labs  06/28/15 0326  PROT 6.3*  ALBUMIN 2.9*  AST 15  ALT 17  ALKPHOS 76  BILITOT 0.5   PT/INR No results for input(s): LABPROT, INR in the last 72  hours.   Studies/Results: Dg Chest Port 1 View  06/28/2015  CLINICAL DATA:  Acute onset of shortness of breath, diaphoresis and generalized chest tightness. Decreased O2 saturation. Initial encounter. EXAM: PORTABLE CHEST 1 VIEW COMPARISON:  Chest radiograph performed 06/18/2015 FINDINGS: The lungs are well-aerated. Vascular congestion is noted. Bilateral central airspace opacities raise concern for pulmonary is edema lungs. There is no evidence of pleural effusion or pneumothorax. The cardiomediastinal silhouette is mildly enlarged. The patient is status post median sternotomy, with evidence of prior CABG. No acute osseous abnormalities are seen. IMPRESSION: Vascular congestion and mild cardiomegaly. Bilateral central airspace opacities raise concern for pulmonary edema. Electronically Signed   By: Garald Balding M.D.   On: 06/28/2015 03:44    Impression/Plan: 65 yo with microcytic anemia and heme positive stool being managed for a CHF exacerbation. Agree that an inpt workup of his anemia is needed with colon/EGD but do not feel he needs to have these procedures urgently this weekend. He also will need Propofol for sedation and that is not available in the hospital endoscopy unit on weekends. He needs further diuresing before I would recommend undergoing a colon/EGD for microcytic anemia without overt bleeding. Will have my partner reassess him on Tuesday 07/03/15 if he is still an inpt but if his CHF status improves enough to be discharged over the holiday weekend then we will arrange procedures as an outpt. Ideally, due to his tenuous state I would recommend procedures be done as an inpatient. Dr. Cristina Gong is on call this weekend if questions arise. Thank you for this consultation.    LOS: 1 day   [place] C.  06/29/2015, 2:22 PM  Pager [phone]  If no answer or after 5 PM call [phone]

## 2015-06-29 NOTE — Progress Notes (Signed)
[facility]  Patient Status: Active (receiving services up to time of hospitalization)  [facility] is providing the following services: RN  If patient discharges after hours, please call [phone].   Leonard [name] 06/29/2015, 10:16 AM

## 2015-06-29 NOTE — Progress Notes (Signed)
Occupational Therapy treatment Note  Pt instructed in bil. UE strengthening program using theraband.  He fatigues quickly.  Encouraged him to perform over weekend.     06/29/15 2000  OT Visit Information  Last OT Received On 06/29/15  Assistance Needed +1  History of Present Illness 65 yo M with history of CKD, CAD, diastolic CHF, type 2 diabetes, hyperlipidemia, PVD, COPD, hypertension presenting with shortness of breath and hypoxia. CXR, labs and vitals point to CHF as the cause.   Precautions  Precautions Fall  Precaution Comments Most recent fall was last October in his apartment  Pain Assessment  Pain Assessment No/denies pain  Cognition  Arousal/Alertness Awake/alert  Behavior During Therapy WFL for tasks assessed/performed  Overall Cognitive Status Within Functional Limits for tasks assessed  Exercises  Exercises General Upper Extremity  General Exercises - Upper Extremity  Shoulder Flexion Strengthening;Right;Left;10 reps;Seated;Theraband  Shoulder Horizontal ABduction Strengthening;Right;Left;10 reps;Seated;Theraband  Theraband Level (Shoulder Flexion) Level 3 (Green)  Theraband Level (Shoulder Horizontal Abduction) Level 3 (Green)  OT - End of Session  Activity Tolerance Patient limited by fatigue  Patient left in bed;with call bell/phone within reach  OT Assessment/Plan  OT Plan Discharge plan remains appropriate  OT Frequency (ACUTE ONLY) Min 2X/week  Follow Up Recommendations Home health OT;Supervision/Assistance - 24 hour;Other (comment) (HHaide )  OT Equipment None recommended by OT  OT Goal Progression  Progress towards OT goals Progressing toward goals  Acute Rehab OT Goals  Patient Stated Goal go home.  OT Goal Formulation With patient  Time For Goal Achievement 07/13/15  Potential to Achieve Goals Good  OT Time Calculation  OT Start Time (ACUTE ONLY) 1839  OT Stop Time (ACUTE ONLY) 1856  OT Time Calculation (min) 17 min  OT General Charges  $OT Visit  1 Procedure  OT Treatments  $Therapeutic Exercise 8-22 mins  [facility], OTR/L [phone]

## 2015-06-29 NOTE — Progress Notes (Signed)
Inpatient Diabetes Program Recommendations  AACE/ADA: New Consensus Statement on Inpatient Glycemic Control (2015)  Target Ranges:  Prepandial:   less than 140 mg/dL      Peak postprandial:   less than 180 mg/dL (1-2 hours)      Critically ill patients:  140 - 180 mg/dL   Review of Glycemic Control  Diabetes history: DM2 Outpatient Diabetes medications: Lantus 65 units bid, Novolog 35 units tidwc Current orders for Inpatient glycemic control: Lantus 40 units bid, Novolog moderate tidwc and hs  Results for NOEH, DUFFEK (MRN [id number]) as of 06/29/2015 12:14  Ref. Range 06/03/2015 05:40  Hemoglobin A1C Latest Ref Range: 4.8-5.6 % 10.3 (H)  Results for HENRIQUE, KIRNER (MRN [id number]) as of 06/29/2015 12:14  Ref. Range 06/28/2015 11:56 06/28/2015 16:51 06/28/2015 21:01 06/29/2015 08:05 06/29/2015 11:33  Glucose-Capillary Latest Ref Range: 65-99 mg/dL 315 (H) 365 (H) 296 (H) 303 (H) 254 (H)  Blood sugars above goal. Needs insulin adjustment.  Inpatient Diabetes Program Recommendations:    Add Novolog 6 units tidwc for meal coverage insulin. Titrate Lantus if FBS > 180 mg/dL.  Thank you. Lorenda Peck, RD, LDN, CDE Inpatient Diabetes Coordinator [phone]

## 2015-06-30 DIAGNOSIS — I25768 Atherosclerosis of bypass graft of coronary artery of transplanted heart with other forms of angina pectoris: Secondary | ICD-10-CM

## 2015-06-30 DIAGNOSIS — D638 Anemia in other chronic diseases classified elsewhere: Secondary | ICD-10-CM

## 2015-06-30 DIAGNOSIS — J438 Other emphysema: Secondary | ICD-10-CM

## 2015-06-30 LAB — BASIC METABOLIC PANEL
Anion gap: 11 (ref 5–15)
BUN: 68 mg/dL — ABNORMAL HIGH (ref 6–20)
CHLORIDE: 101 mmol/L (ref 101–111)
CO2: 27 mmol/L (ref 22–32)
Calcium: 8.8 mg/dL — ABNORMAL LOW (ref 8.9–10.3)
Creatinine, Ser: 2.87 mg/dL — ABNORMAL HIGH (ref 0.61–1.24)
GFR, EST AFRICAN AMERICAN: 25 mL/min — AB (ref >60)
GFR, EST NON AFRICAN AMERICAN: 22 mL/min — AB (ref >60)
Glucose, Bld: 245 mg/dL — ABNORMAL HIGH (ref 65–99)
POTASSIUM: 3.6 mmol/L (ref 3.5–5.1)
SODIUM: 139 mmol/L (ref 135–145)

## 2015-06-30 LAB — GLUCOSE, CAPILLARY
GLUCOSE-CAPILLARY: 198 mg/dL — AB (ref 65–99)
GLUCOSE-CAPILLARY: 232 mg/dL — AB (ref 65–99)
GLUCOSE-CAPILLARY: 224 mg/dL — AB (ref 65–99)
GLUCOSE-CAPILLARY: 239 mg/dL — AB (ref 65–99)

## 2015-06-30 LAB — CBC
HCT: 24.2 % — ABNORMAL LOW (ref 39.0–52.0)
HEMOGLOBIN: 7.3 g/dL — AB (ref 13.0–17.0)
MCH: 22.6 pg — ABNORMAL LOW (ref 26.0–34.0)
MCHC: 30.2 g/dL (ref 30.0–36.0)
MCV: 74.9 fL — ABNORMAL LOW (ref 78.0–100.0)
PLATELETS: 223 10*3/uL (ref 150–400)
RBC: 3.23 MIL/uL — AB (ref 4.22–5.81)
RDW: 19.7 % — ABNORMAL HIGH (ref 11.5–15.5)
WBC: 8.3 10*3/uL (ref 4.0–10.5)

## 2015-06-30 MED ORDER — INSULIN ASPART 100 UNIT/ML ~~LOC~~ SOLN
6.0000 [IU] | Freq: Three times a day (TID) | SUBCUTANEOUS | Status: DC
  Administered 2015-06-30 – 2015-07-01 (×3): 6 [IU] via SUBCUTANEOUS

## 2015-06-30 MED ORDER — FUROSEMIDE 10 MG/ML IJ SOLN
40.0000 mg | Freq: Two times a day (BID) | INTRAMUSCULAR | Status: DC
  Administered 2015-06-30 – 2015-07-01 (×3): 40 mg via INTRAVENOUS
  Filled 2015-06-30 (×3): qty 4

## 2015-06-30 NOTE — Progress Notes (Signed)
Family Medicine Teaching Service Daily Progress Note Intern Pager: [phone]  [name] Medical record number: [id number] Date of birth: 10-13-1950 Age: 65 y.o. Gender: male  Primary Care Provider: Clearance Coots, MD Consultants: HF team Code Status: Full  Pt Overview and Major Events to Date:  5/25: Admitted to FMTS with CHF exacerbation  Assessment and Plan: Leonard [name] is a 65 y.o. male presenting with shortness of breath. 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  #Acute on chronic CHF: Likely 2/2 non-compliance. ABI severe arterial insuffiencey. Significant output yesterday.  - HF team rec's: reduce lasix to 40 mg IV Metatolazone 2.5 mg received 5/26 - Daily weights - Strict I/O's  #Severe arterial insuffiencey: observed on ABI's - daily asa -  Lipitor  - consider cardiac rehab  #Microcytic Anemia: Likely iron deficiency with renal disease . - GI: recommend EGD/Colonoscopy but unable to perform with propofol on the weekend. Can perform on Tuesday but if discharged then will be performed outpatient.  - Takes Aranesp q2 weeks  #Uncontrolled HTN: BPs improved - Continue home meds: Norvasc 10mg  daily, Doxazosin 1mg  bid, Hydralazine 100mg  tid, Coreg 50mg  bid. Imdur 60mg  daily added by HF team. - Will measure all BPs while patient is standing  #Type 2 DM: Poorly controlled. A1c 10.5%. On Lantus 65 units bid and Novolog 35 units tid with meals at home.  - Lantus 40 units bid.  - Continue moderate SSI.  - added Novolog 6 U TID WC along with sliding scale  - CBGs four times a day  #CKD IIIB: Cr 2.75 > 2.84. Baseline 2.5-2.7. - Recommend Nephrology outpatient follow-up  #Hypothyroidism: TSH 12.559 last admission. - Continue Synthroid 72mcg daily  #CAD s/p CABG x 3 / PAD / HLD:  - Continue home Lipitor, Coreg, ASA  FEN/GI: Heart healthy carb-modified diet, Protonix  40mg  daily Prophylaxis: Heparin sq  Disposition: Pending adequate diuresis.  Subjective:  Feeing better today. His leg edema has improved.   Objective: Temp:  [97.9 F (36.6 C)-98.4 F (36.9 C)] 98.2 F (36.8 C) (05/27 0610) Pulse Rate:  [67-70] 70 (05/27 0610) Resp:  [12-23] 18 (05/27 0610) BP: (127-176)/(62-77) 155/74 mmHg (05/27 0610) SpO2:  [97 %-100 %] 97 % (05/27 0610) Weight:  [228 lb 8 oz (103.647 kg)-234 lb 9.6 oz (106.414 kg)] 228 lb 8 oz (103.647 kg) (05/27 0610) Physical Exam: General: Well-appearing, sitting up in bed Neck: Supple, no JVD Cardiovascular: RRR, no murmurs, rubs or gallop. 2+ radial pulses bilaterally  Respiratory: mild bibasilar crackles, normal work of breathing Abdomen: +BS, soft, non-tender, non-distended MSK: Trace lower extremity edema Skin: No rashes or lesions Neuro: Awake, alert, oriented,  Psych: Appropriate affect, normal behavior  Laboratory:  Recent Labs Lab 06/29/15 0238 06/29/15 1106 06/30/15 0317  WBC 7.8 9.5 8.3  HGB 6.8* 7.4* 7.3*  HCT 22.1* 24.5* 24.2*  PLT 200 221 223    Recent Labs Lab 06/28/15 0326  06/28/15 1629 06/29/15 0238 06/30/15 0317  NA 133*  < > 133* 135 139  K 3.5  < > 4.4 3.9 3.6  CL 100*  < > 100* 102 101  CO2 24  < > 20* 23 27  BUN 44*  < > 53* 59* 68*  CREATININE 2.75*  < > 2.81* 2.84* 2.87*  CALCIUM 8.2*  < > 8.6* 8.4* 8.8*  PROT 6.3*  --   --   --   --  BILITOT 0.5  --   --   --   --   ALKPHOS 76  --   --   --   --   ALT 17  --   --   --   --   AST 15  --   --   --   --   GLUCOSE 200*  < > 362* 334* 245*  < > = values in this interval not displayed. BNP 467 i-stat troponin: 0.02 Trop: 0.03 > 0.04 > <0.03  Imaging/Diagnostic Tests: CXR (5/25): Vascular congestion and mild cardiomegaly. Bilateral central airspace opacities, concerning for pulmonary edema  Rosemarie Ax, MD 06/30/2015, 8:15 AM PGY-3, [place] Intern pager: [phone], text pages welcome

## 2015-06-30 NOTE — Progress Notes (Signed)
SUBJECTIVE: Feels well. Complains of nasal dryness. Says leg swelling has gone down considerably. Roughly 2L output in last 24 hrs.   ROS: Other than pertinent positives in "Subjective", all others were reviewed and found to be negative.   Intake/Output Summary (Last 24 hours) at 06/30/15 1001 Last data filed at 06/30/15 0800  Gross per 24 hour  Intake    826 ml  Output   2600 ml  Net  -1774 ml    Current Facility-Administered Medications  Medication Dose Route Frequency Provider Last Rate Last Dose  . 0.9 %  sodium chloride infusion  250 mL Intravenous PRN Sela Hua, MD      . acetaminophen (TYLENOL) tablet 650 mg  650 mg Oral Q4H PRN Sela Hua, MD      . albuterol (PROVENTIL) (2.5 MG/3ML) 0.083% nebulizer solution 3 mL  3 mL Inhalation Q6H PRN Sela Hua, MD      . amLODipine (NORVASC) tablet 10 mg  10 mg Oral Daily Sela Hua, MD   10 mg at 06/29/15 1100  . aspirin EC tablet 81 mg  81 mg Oral Daily Sela Hua, MD   81 mg at 06/29/15 1100  . atorvastatin (LIPITOR) tablet 40 mg  40 mg Oral q1800 Sela Hua, MD   40 mg at 06/29/15 1700  . carvedilol (COREG) tablet 50 mg  50 mg Oral BID WC Sela Hua, MD   50 mg at 06/30/15 0835  . doxazosin (CARDURA) tablet 1 mg  1 mg Oral Q12H Larey Dresser, MD   1 mg at 06/29/15 2216  . furosemide (LASIX) injection 80 mg  80 mg Intravenous BID Satira Mccallum Wharton, PA-C   80 mg at 06/30/15 [id number]  . hydrALAZINE (APRESOLINE) injection 10 mg  10 mg Intravenous Q6H PRN Leeanne Rio, MD   10 mg at 06/29/15 0139  . hydrALAZINE (APRESOLINE) tablet 100 mg  100 mg Oral Q8H Shirley Friar, PA-C   100 mg at 06/30/15 [id number]  . insulin aspart (novoLOG) injection 0-15 Units  0-15 Units Subcutaneous TID WC Sela Hua, MD   3 Units at 06/30/15 [phone]  . insulin aspart (novoLOG) injection 0-5 Units  0-5 Units Subcutaneous QHS Sela Hua, MD   3 Units at 06/28/15 2136  . insulin aspart (novoLOG) injection 6  Units  6 Units Subcutaneous TID WC Rosemarie Ax, MD      . insulin glargine (LANTUS) injection 40 Units  40 Units Subcutaneous BID Sela Hua, MD   40 Units at 06/29/15 2247  . isosorbide mononitrate (IMDUR) 24 hr tablet 60 mg  60 mg Oral Daily Satira Mccallum Tillery, PA-C   60 mg at 06/29/15 1100  . levothyroxine (SYNTHROID, LEVOTHROID) tablet 50 mcg  50 mcg Oral QAC breakfast Sela Hua, MD   50 mcg at 06/30/15 [phone]  . Living Better with Heart Failure Book   Does not apply Once Ashly M Gottschalk, DO      . ondansetron [facility]) injection 4 mg  4 mg Intravenous Q6H PRN Sela Hua, MD      . pantoprazole (PROTONIX) EC tablet 40 mg  40 mg Oral Daily Sela Hua, MD   40 mg at 06/29/15 1100  . sodium chloride flush (NS) 0.9 % injection 3 mL  3 mL Intravenous Q12H Sela Hua, MD   3 mL at 06/29/15 2217  . sodium chloride flush (NS) 0.9 %  injection 3 mL  3 mL Intravenous PRN Sela Hua, MD   3 mL at 06/30/15 0842    Filed Vitals:   06/29/15 1353 06/29/15 2012 06/30/15 0610 06/30/15 0832  BP: 151/68 142/64 155/74 147/59  Pulse: 67 70 70 73  Temp: 98 F (36.7 C) 98.4 F (36.9 C) 98.2 F (36.8 C)   TempSrc: Oral Oral Oral   Resp: 16 18 18    Height:      Weight:   228 lb 8 oz (103.647 kg)   SpO2: 100% 97% 97%     PHYSICAL EXAM General: NAD HEENT: Normal. Neck: JVP 10 cm.  Lungs: Slightly diminished throughout CV: Nondisplaced PMI.  Regular rate and rhythm, normal S1/S2, no S3/S4, no murmur.  Trivial pretibial edema.    Abdomen: Nontender, obese.  Neurologic: Alert and oriented x 3.  Psych: Normal affect. Musculoskeletal: Normal range of motion. No gross deformities. Extremities: No clubbing or cyanosis.    LABS: Basic Metabolic Panel:  Recent Labs  06/29/15 0238 06/30/15 0317  NA 135 139  K 3.9 3.6  CL 102 101  CO2 23 27  GLUCOSE 334* 245*  BUN 59* 68*  CREATININE 2.84* 2.87*  CALCIUM 8.4* 8.8*   Liver Function Tests:  Recent Labs   06/28/15 0326  AST 15  ALT 17  ALKPHOS 76  BILITOT 0.5  PROT 6.3*  ALBUMIN 2.9*   No results for input(s): LIPASE, AMYLASE in the last 72 hours. CBC:  Recent Labs  06/28/15 0326  06/29/15 1106 06/30/15 0317  WBC 9.7  < > 9.5 8.3  NEUTROABS 7.5  --   --   --   HGB 7.7*  < > 7.4* 7.3*  HCT 25.4*  < > 24.5* 24.2*  MCV 75.4*  < > 74.0* 74.9*  PLT 189  < > 221 223  < > = values in this interval not displayed. Cardiac Enzymes:  Recent Labs  06/28/15 1337 06/28/15 1905  TROPONINI 0.04* <0.03   BNP: Invalid input(s): POCBNP D-Dimer: No results for input(s): DDIMER in the last 72 hours. Hemoglobin A1C: No results for input(s): HGBA1C in the last 72 hours. Fasting Lipid Panel: No results for input(s): CHOL, HDL, LDLCALC, TRIG, CHOLHDL, LDLDIRECT in the last 72 hours. Thyroid Function Tests: No results for input(s): TSH, T4TOTAL, T3FREE, THYROIDAB in the last 72 hours.  Invalid input(s): FREET3 Anemia Panel: No results for input(s): VITAMINB12, FOLATE, FERRITIN, TIBC, IRON, RETICCTPCT in the last 72 hours.  RADIOLOGY: Dg Chest 2 View  06/18/2015  CLINICAL DATA:  Mid to left-sided chest pain and lower extremity edema. EXAM: CHEST  2 VIEW COMPARISON:  06/03/2015 FINDINGS: The heart size is stable and within normal limits status post prior CABG. The aorta shows stable tortuosity. Lungs show evidence of interstitial edema. No significant pleural fluid identified. No focal airspace consolidation, nodule or pneumothorax identified. The bony thorax is unremarkable. IMPRESSION: Pulmonary interstitial edema. Electronically Signed   By: Aletta Edouard M.D.   On: 06/18/2015 12:58   US Renal  06/19/2015  CLINICAL DATA:  Chronic renal disease. EXAM: RENAL / URINARY TRACT ULTRASOUND COMPLETE COMPARISON:  04/18/2013. FINDINGS: Right Kidney: Length: 11.8 cm. Echogenicity within normal limits. No mass or hydronephrosis visualized. Left Kidney: Length: 12.0 cm. Echogenicity within normal  limits. Cortical irregularity. Scarring cannot be excluded. No solid mass or hydronephrosis visualized. 2.5 x 1.9 x 2.3 cm simple cyst lower pole. Bladder: Appears normal for degree of bladder distention. IMPRESSION: Left renal cortical irregularity. Scarring cannot be  excluded. 2.5 cm simple cyst lower pole left kidney. Exam is otherwise unremarkable. No hydronephrosis or bladder distention. Electronically Signed   By: Marcello Moores  Register   On: 06/19/2015 07:18   Dg Chest Port 1 View  06/28/2015  CLINICAL DATA:  Acute onset of shortness of breath, diaphoresis and generalized chest tightness. Decreased O2 saturation. Initial encounter. EXAM: PORTABLE CHEST 1 VIEW COMPARISON:  Chest radiograph performed 06/18/2015 FINDINGS: The lungs are well-aerated. Vascular congestion is noted. Bilateral central airspace opacities raise concern for pulmonary is edema lungs. There is no evidence of pleural effusion or pneumothorax. The cardiomediastinal silhouette is mildly enlarged. The patient is status post median sternotomy, with evidence of prior CABG. No acute osseous abnormalities are seen. IMPRESSION: Vascular congestion and mild cardiomegaly. Bilateral central airspace opacities raise concern for pulmonary edema. Electronically Signed   By: Garald Balding M.D.   On: 06/28/2015 03:44   Dg Chest Portable 1 View  06/03/2015  CLINICAL DATA:  Shortness of breath and chest pain EXAM: PORTABLE CHEST 1 VIEW COMPARISON:  02/05/2015 FINDINGS: Cardiomegaly with stable aortic contours. The patient is status post CABG. New interstitial coarsening with occasional Kerley line. No effusion. IMPRESSION: Mild CHF. Electronically Signed   By: Monte Fantasia M.D.   On: 06/03/2015 02:03   Dg Knee Complete 4 Views Right  06/05/2015  CLINICAL DATA:  Right knee pain.  No known injury. EXAM: RIGHT KNEE - COMPLETE 4+ VIEW COMPARISON:  11/25/2014 FINDINGS: No fracture or dislocation. Mild tricompartmental degenerative change of the knee,  likely worse in the medial compartment with joint space loss, subchondral sclerosis and osteophytosis. There is minimal spurring of the tibial spines. No evidence of chondrocalcinosis. Query small ankle joint effusion. Extensive adjacent vascular calcifications. No radiopaque foreign body. IMPRESSION: 1. Small joint effusion.  Otherwise, no acute findings. 2. 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 3. Extensive adjacent vascular calcifications. Correlation for symptoms of PAD is recommended. Electronically Signed   By: Sandi Mariscal M.D.   On: 06/05/2015 16:31      ASSESSMENT AND PLAN: EMONI KAYTON is a 65 y.o. male with history of uncontrolled HTN, 123456, Chronic diastolic CHF Echo [id number] LVEF 60-65% with grade 2 DD and normal RV. 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 Presented with SOB, CP, and edema along with hypertensive urgency.  1. Acute on chronic diastolic CHF: Suspect potentiated by HTN and CKD. Good diuretic response with 2L output in last 24 hrs. BUN trending up. - Will reduce Lasix to 40 mg IV bid. Received metolazone 2.5 mg 5/26.   2. Hypertensive urgency:  - Continue Coreg, amlodipine. - Hydralazine increased to 100 mg tid.  - BP improving after increase of doxazosin to 1 mg bid.  - No evidence of renal artery stenosis.   3. CKD Stage III-IV: This makes BP control and CHF control worse. Follow closely with diuresis. Sees nephrology as outpatient.   4. CAD: s/p CABG. Rare exertional chest pain. Dyspnea is a much more prominent symptom. Doubt ACS. Negative cardiac enzymes. We added Imdur 60 mg daily.   5. PAD: Large pressure differential between arms and legs. Will arrange for peripheral arterial doppler evaluation.   6. Anemia: ?Chronic disease/renal disease. No overt bleeding. Hgb 7.3    Kate Sable, M.D., F.A.C.C.

## 2015-07-01 DIAGNOSIS — I160 Hypertensive urgency: Secondary | ICD-10-CM

## 2015-07-01 LAB — CBC
HEMATOCRIT: 26.6 % — AB (ref 39.0–52.0)
HEMOGLOBIN: 7.9 g/dL — AB (ref 13.0–17.0)
MCH: 22.1 pg — ABNORMAL LOW (ref 26.0–34.0)
MCHC: 29.7 g/dL — AB (ref 30.0–36.0)
MCV: 74.3 fL — AB (ref 78.0–100.0)
Platelets: 233 10*3/uL (ref 150–400)
RBC: 3.58 MIL/uL — ABNORMAL LOW (ref 4.22–5.81)
RDW: 19.8 % — ABNORMAL HIGH (ref 11.5–15.5)
WBC: 7.8 10*3/uL (ref 4.0–10.5)

## 2015-07-01 LAB — BASIC METABOLIC PANEL
ANION GAP: 10 (ref 5–15)
BUN: 64 mg/dL — ABNORMAL HIGH (ref 6–20)
CHLORIDE: 99 mmol/L — AB (ref 101–111)
CO2: 29 mmol/L (ref 22–32)
Calcium: 8.7 mg/dL — ABNORMAL LOW (ref 8.9–10.3)
Creatinine, Ser: 2.74 mg/dL — ABNORMAL HIGH (ref 0.61–1.24)
GFR calc non Af Amer: 23 mL/min — ABNORMAL LOW (ref >60)
GFR, EST AFRICAN AMERICAN: 27 mL/min — AB (ref >60)
GLUCOSE: 227 mg/dL — AB (ref 65–99)
Potassium: 3.4 mmol/L — ABNORMAL LOW (ref 3.5–5.1)
Sodium: 138 mmol/L (ref 135–145)

## 2015-07-01 LAB — GLUCOSE, CAPILLARY
GLUCOSE-CAPILLARY: 268 mg/dL — AB (ref 65–99)
GLUCOSE-CAPILLARY: 262 mg/dL — AB (ref 65–99)
Glucose-Capillary: 203 mg/dL — ABNORMAL HIGH (ref 65–99)
Glucose-Capillary: 132 mg/dL — ABNORMAL HIGH (ref 65–99)

## 2015-07-01 MED ORDER — INSULIN ASPART 100 UNIT/ML ~~LOC~~ SOLN
10.0000 [IU] | Freq: Three times a day (TID) | SUBCUTANEOUS | Status: DC
  Administered 2015-07-01 – 2015-07-02 (×4): 10 [IU] via SUBCUTANEOUS

## 2015-07-01 NOTE — Progress Notes (Signed)
Family Medicine Teaching Service Daily Progress Note Intern Pager: [phone]  [name] Medical record number: [id number] Date of birth: 18-Mar-1950 Age: 65 y.o. Gender: male  Primary Care Provider: Clearance Coots, MD Consultants: HF team Code Status: Full  Pt Overview and Major Events to Date:  5/25: Admitted to FMTS with CHF exacerbation  Assessment and Plan: Leonard [name] is a 65 y.o. male presenting with shortness of breath. 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  #Acute on chronic CHF: Likely 2/2 non-compliance. Weight 241 on admission > 221lb this am (dry weight in the low 220s). UOP 2.95L. Net -5.5 L since admission. On exam, no crackles and no lower extremity edema. - HF team recs: Reduced lasix to 40 mg IV bid. Metatolazone 2.5 mg received 5/26.  - Would like to transition PO Lasix later today or tomorrow. - Daily weights - Strict I/O's  #Severe arterial insuffiencey: observed on ABI's. - Continue ASA, Lipitor - Consider cardiac rehab  #Microcytic Anemia: Likely iron deficiency with renal disease vs GI bleed. FOBT positive. Hgb improved from 7.3 > 7.9 today. - GI: recommend EGD/Colonoscopy but unable to perform with propofol on the weekend. Can perform on Tuesday but if discharged, then will be performed outpatient.  - Takes Aranesp q2 weeks - Daily CBCs  #HTN: Improving. BPs 144/54, 155/58 most recently. No dizziness, no SOB, no palpitations, no chest pain. - Continue home meds: Norvasc 10mg  daily, Doxazosin 1mg  bid, Hydralazine 100mg  tid, Coreg 50mg  bid. Imdur 60mg  daily added by HF team. - Will measure all BPs while patient is standing  #Type 2 DM: Poorly controlled. A1c 10.5%. On Lantus 65 units bid and Novolog 35 units tid with meals at home. CBGs 198-239 in the last 24 hours. Received 22 units of Novolog per sliding scale. - Lantus 40 units bid.  - Will  increase Novolog from 6 units tid to 10 units tid with meals. - Continue moderate SSI.  - CBGs four times a day   #CKD IIIB: Cr 2.87 > 2.74. Baseline 2.5-2.7. - Recommend Nephrology outpatient follow-up  #Hypothyroidism: TSH 12.559 last admission. - Continue Synthroid 65mcg daily  #CAD s/p CABG x 3 / PAD / HLD:  - Continue home Lipitor, Coreg, ASA  FEN/GI: Heart healthy carb-modified diet, Protonix 40mg  daily Prophylaxis: Heparin sq  Disposition: Pending adequate diuresis. Home with [facility]. Resume HH services on discharge.  Subjective:  Pt doing well this morning. No chest pain, no shortness of breath, no dizziness. No concerns.  Objective: Temp:  [98.1 F (36.7 C)-98.4 F (36.9 C)] 98.1 F (36.7 C) (05/28 0629) Pulse Rate:  [60-71] 60 (05/28 0629) Resp:  [18] 18 (05/28 0629) BP: (144-163)/(54-70) 155/58 mmHg (05/28 0629) SpO2:  [97 %] 97 % (05/28 0629) Weight:  [221 lb 8 oz (100.472 kg)] 221 lb 8 oz (100.472 kg) (05/28 [id number]) Physical Exam: General: Well-appearing, sitting up in bed Neck: Supple, no JVD Cardiovascular: RRR, no murmurs, rubs or gallop. 2+ radial pulses bilaterally  Respiratory: mild bibasilar crackles, normal work of breathing Abdomen: +BS, soft, non-tender, non-distended MSK: Trace lower extremity edema Skin: No rashes or lesions Neuro: Awake, alert, oriented,  Psych: Appropriate affect, normal behavior  Laboratory:  Recent Labs Lab 06/29/15 1106 06/30/15 0317 07/01/15 0522  WBC 9.5 8.3 7.8  HGB 7.4* 7.3* 7.9*  HCT 24.5* 24.2* 26.6*  PLT 221 223 233    Recent Labs  Lab 06/28/15 0326  06/29/15 0238 06/30/15 0317 07/01/15 0522  NA 133*  < > 135 139 138  K 3.5  < > 3.9 3.6 3.4*  CL 100*  < > 102 101 99*  CO2 24  < > 23 27 29   BUN 44*  < > 59* 68* 64*  CREATININE 2.75*  < > 2.84* 2.87* 2.74*  CALCIUM 8.2*  < > 8.4* 8.8* 8.7*  PROT 6.3*  --   --   --   --   BILITOT 0.5  --   --   --   --   ALKPHOS 76  --   --    --   --   ALT 17  --   --   --   --   AST 15  --   --   --   --   GLUCOSE 200*  < > 334* 245* 227*  < > = values in this interval not displayed. BNP 467 i-stat troponin: 0.02 Trop: 0.03 > 0.04 > <0.03 FOBT positive  Imaging/Diagnostic Tests: - CXR (5/25): Vascular congestion and mild cardiomegaly. 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 - Peripheral arterial duplex: severe arterial insufficiency on the right, moderate arterial sufficiency on the left  Sela Hua, MD 07/01/2015, 8:32 AM PGY-1, [place] Intern pager: [phone], text pages welcome

## 2015-07-01 NOTE — Progress Notes (Signed)
SUBJECTIVE: Says he is feeling better. Sl 2L output in last 24 hrs.   ROS: Other than pertinent positives in "Subjective", all others were reviewed and found to be negative.   Intake/Output Summary (Last 24 hours) at 07/01/15 0915 Last data filed at 07/01/15 0800  Gross per 24 hour  Intake    840 ml  Output   2875 ml  Net  -2035 ml    Current Facility-Administered Medications  Medication Dose Route Frequency Provider Last Rate Last Dose  . 0.9 %  sodium chloride infusion  250 mL Intravenous PRN Sela Hua, MD      . acetaminophen (TYLENOL) tablet 650 mg  650 mg Oral Q4H PRN Sela Hua, MD      . albuterol (PROVENTIL) (2.5 MG/3ML) 0.083% nebulizer solution 3 mL  3 mL Inhalation Q6H PRN Sela Hua, MD      . amLODipine (NORVASC) tablet 10 mg  10 mg Oral Daily Sela Hua, MD   10 mg at 06/30/15 1007  . aspirin EC tablet 81 mg  81 mg Oral Daily Sela Hua, MD   81 mg at 06/30/15 1007  . atorvastatin (LIPITOR) tablet 40 mg  40 mg Oral q1800 Sela Hua, MD   40 mg at 06/30/15 1728  . carvedilol (COREG) tablet 50 mg  50 mg Oral BID WC Sela Hua, MD   50 mg at 07/01/15 0751  . doxazosin (CARDURA) tablet 1 mg  1 mg Oral Q12H Larey Dresser, MD   1 mg at 06/30/15 2223  . furosemide (LASIX) injection 40 mg  40 mg Intravenous BID Herminio Commons, MD   40 mg at 07/01/15 0751  . hydrALAZINE (APRESOLINE) injection 10 mg  10 mg Intravenous Q6H PRN Leeanne Rio, MD   10 mg at 06/29/15 0139  . hydrALAZINE (APRESOLINE) tablet 100 mg  100 mg Oral Q8H Satira Mccallum Tillery, PA-C   100 mg at 07/01/15 0600  . insulin aspart (novoLOG) injection 0-15 Units  0-15 Units Subcutaneous TID WC Sela Hua, MD   5 Units at 07/01/15 0827  . insulin aspart (novoLOG) injection 0-5 Units  0-5 Units Subcutaneous QHS Sela Hua, MD   2 Units at 06/30/15 2226  . insulin aspart (novoLOG) injection 10 Units  10 Units Subcutaneous TID WC Sela Hua, MD      .  insulin glargine (LANTUS) injection 40 Units  40 Units Subcutaneous BID Sela Hua, MD   40 Units at 06/30/15 2223  . isosorbide mononitrate (IMDUR) 24 hr tablet 60 mg  60 mg Oral Daily Satira Mccallum Tillery, PA-C   60 mg at 06/30/15 1007  . levothyroxine (SYNTHROID, LEVOTHROID) tablet 50 mcg  50 mcg Oral QAC breakfast Sela Hua, MD   50 mcg at 07/01/15 0751  . Living Better with Heart Failure Book   Does not apply Once Ashly M Gottschalk, DO      . ondansetron [facility]) injection 4 mg  4 mg Intravenous Q6H PRN Sela Hua, MD      . pantoprazole (PROTONIX) EC tablet 40 mg  40 mg Oral Daily Sela Hua, MD   40 mg at 06/30/15 1007  . sodium chloride flush (NS) 0.9 % injection 3 mL  3 mL Intravenous Q12H Sela Hua, MD   3 mL at 06/30/15 2227  . sodium chloride flush (NS) 0.9 % injection 3 mL  3 mL Intravenous  PRN Sela Hua, MD   3 mL at 06/30/15 0842    Filed Vitals:   06/30/15 1000 06/30/15 1200 06/30/15 2045 07/01/15 0629  BP: 150/69 163/70 144/54 155/58  Pulse: 71 65 67 60  Temp:  98.4 F (36.9 C) 98.4 F (36.9 C) 98.1 F (36.7 C)  TempSrc:  Oral Oral Oral  Resp:  18 18 18   Height:      Weight:    221 lb 8 oz (100.472 kg)  SpO2: 97% 97% 97% 97%    PHYSICAL EXAM General: NAD HEENT: Normal. Neck: JVP 10 cm.  Lungs: Slightly diminished throughout CV: Nondisplaced PMI. Regular rate and rhythm, normal S1/S2, no S3/S4, no murmur. Trivial pretibial edema.  Abdomen: Nontender, obese.  Neurologic: Alert and oriented x 3.  Psych: Normal affect. Musculoskeletal: Normal range of motion. No gross deformities. Extremities: No clubbing or cyanosis.   LABS: Basic Metabolic Panel:  Recent Labs  06/30/15 0317 07/01/15 0522  NA 139 138  K 3.6 3.4*  CL 101 99*  CO2 27 29  GLUCOSE 245* 227*  BUN 68* 64*  CREATININE 2.87* 2.74*  CALCIUM 8.8* 8.7*   Liver Function Tests: No results for input(s): AST, ALT, ALKPHOS, BILITOT, PROT, ALBUMIN in the last 72  hours. No results for input(s): LIPASE, AMYLASE in the last 72 hours. CBC:  Recent Labs  06/30/15 0317 07/01/15 0522  WBC 8.3 7.8  HGB 7.3* 7.9*  HCT 24.2* 26.6*  MCV 74.9* 74.3*  PLT 223 233   Cardiac Enzymes:  Recent Labs  06/28/15 1337 06/28/15 1905  TROPONINI 0.04* <0.03   BNP: Invalid input(s): POCBNP D-Dimer: No results for input(s): DDIMER in the last 72 hours. Hemoglobin A1C: No results for input(s): HGBA1C in the last 72 hours. Fasting Lipid Panel: No results for input(s): CHOL, HDL, LDLCALC, TRIG, CHOLHDL, LDLDIRECT in the last 72 hours. Thyroid Function Tests: No results for input(s): TSH, T4TOTAL, T3FREE, THYROIDAB in the last 72 hours.  Invalid input(s): FREET3 Anemia Panel: No results for input(s): VITAMINB12, FOLATE, FERRITIN, TIBC, IRON, RETICCTPCT in the last 72 hours.  RADIOLOGY: Dg Chest 2 View  06/18/2015  CLINICAL DATA:  Mid to left-sided chest pain and lower extremity edema. EXAM: CHEST  2 VIEW COMPARISON:  06/03/2015 FINDINGS: The heart size is stable and within normal limits status post prior CABG. The aorta shows stable tortuosity. Lungs show evidence of interstitial edema. No significant pleural fluid identified. No focal airspace consolidation, nodule or pneumothorax identified. The bony thorax is unremarkable. IMPRESSION: Pulmonary interstitial edema. Electronically Signed   By: Aletta Edouard M.D.   On: 06/18/2015 12:58   US Renal  06/19/2015  CLINICAL DATA:  Chronic renal disease. EXAM: RENAL / URINARY TRACT ULTRASOUND COMPLETE COMPARISON:  04/18/2013. FINDINGS: Right Kidney: Length: 11.8 cm. Echogenicity within normal limits. No mass or hydronephrosis visualized. Left Kidney: Length: 12.0 cm. Echogenicity within normal limits. Cortical irregularity. Scarring cannot be excluded. No solid mass or hydronephrosis visualized. 2.5 x 1.9 x 2.3 cm simple cyst lower pole. Bladder: Appears normal for degree of bladder distention. IMPRESSION: Left renal  cortical irregularity. Scarring cannot be excluded. 2.5 cm simple cyst lower pole left kidney. Exam is otherwise unremarkable. No hydronephrosis or bladder distention. Electronically Signed   By: Marcello Moores  Register   On: 06/19/2015 07:18   Dg Chest Port 1 View  06/28/2015  CLINICAL DATA:  Acute onset of shortness of breath, diaphoresis and generalized chest tightness. Decreased O2 saturation. Initial encounter. EXAM: PORTABLE CHEST 1 VIEW COMPARISON:  Chest radiograph performed 06/18/2015 FINDINGS: The lungs are well-aerated. Vascular congestion is noted. Bilateral central airspace opacities raise concern for pulmonary is edema lungs. There is no evidence of pleural effusion or pneumothorax. The cardiomediastinal silhouette is mildly enlarged. The patient is status post median sternotomy, with evidence of prior CABG. No acute osseous abnormalities are seen. IMPRESSION: Vascular congestion and mild cardiomegaly. Bilateral central airspace opacities raise concern for pulmonary edema. Electronically Signed   By: Garald Balding M.D.   On: 06/28/2015 03:44   Dg Chest Portable 1 View  06/03/2015  CLINICAL DATA:  Shortness of breath and chest pain EXAM: PORTABLE CHEST 1 VIEW COMPARISON:  02/05/2015 FINDINGS: Cardiomegaly with stable aortic contours. The patient is status post CABG. New interstitial coarsening with occasional Kerley line. No effusion. IMPRESSION: Mild CHF. Electronically Signed   By: Monte Fantasia M.D.   On: 06/03/2015 02:03   Dg Knee Complete 4 Views Right  06/05/2015  CLINICAL DATA:  Right knee pain.  No known injury. EXAM: RIGHT KNEE - COMPLETE 4+ VIEW COMPARISON:  11/25/2014 FINDINGS: No fracture or dislocation. 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 There is minimal spurring of the tibial spines. No evidence of chondrocalcinosis. Query small ankle joint effusion. Extensive  adjacent vascular calcifications. No radiopaque foreign body. IMPRESSION: 1. Small joint effusion.  Otherwise, no acute findings. 2. 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 3. Extensive adjacent vascular calcifications. Correlation for symptoms of PAD is recommended. Electronically Signed   By: Sandi Mariscal M.D.   On: 06/05/2015 16:31      ASSESSMENT AND PLAN: Leonard [name] is a 65 y.o. male with history of uncontrolled HTN, 123456, Chronic diastolic CHF Echo [id number] LVEF 60-65% with grade 2 DD and normal RV. 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 Presented with SOB, CP, and edema along with hypertensive urgency.  1. Acute on chronic diastolic CHF: Suspect potentiated by HTN and CKD. Good diuretic response with over 2L output in last 24 hrs. BUN stable. - Reduced Lasix to 40 mg IV bid 5/27. Received metolazone 2.5 mg 5/26.   2. Hypertensive urgency:  - Continue Coreg, amlodipine. - Hydralazine increased to 100 mg tid.  - BP elevated today but overall improvement after increase of doxazosin to 1 mg bid.  - No evidence of renal artery stenosis.   3. CKD Stage III-IV: This makes BP control and CHF control worse. Follow closely with diuresis. Sees nephrology as outpatient.   4. CAD: s/p CABG. Rare exertional chest pain. Dyspnea is a much more prominent symptom. Doubt ACS. Negative cardiac enzymes. We added Imdur 60 mg daily.   5. PAD: Large pressure differential between arms and legs. Will arrange for peripheral arterial doppler evaluation.   6. Anemia: ?Chronic disease/renal disease. No overt bleeding. Hgb 7.9.    Kate Sable, M.D., F.A.C.C.

## 2015-07-02 LAB — CBC
HEMATOCRIT: 26.4 % — AB (ref 39.0–52.0)
Hemoglobin: 7.8 g/dL — ABNORMAL LOW (ref 13.0–17.0)
MCH: 22 pg — ABNORMAL LOW (ref 26.0–34.0)
MCHC: 29.5 g/dL — ABNORMAL LOW (ref 30.0–36.0)
MCV: 74.6 fL — AB (ref 78.0–100.0)
PLATELETS: 238 10*3/uL (ref 150–400)
RBC: 3.54 MIL/uL — AB (ref 4.22–5.81)
RDW: 19.5 % — AB (ref 11.5–15.5)
WBC: 8.3 10*3/uL (ref 4.0–10.5)

## 2015-07-02 LAB — BASIC METABOLIC PANEL
ANION GAP: 11 (ref 5–15)
BUN: 63 mg/dL — ABNORMAL HIGH (ref 6–20)
CALCIUM: 8.6 mg/dL — AB (ref 8.9–10.3)
CO2: 28 mmol/L (ref 22–32)
Chloride: 99 mmol/L — ABNORMAL LOW (ref 101–111)
Creatinine, Ser: 2.83 mg/dL — ABNORMAL HIGH (ref 0.61–1.24)
GFR, EST AFRICAN AMERICAN: 26 mL/min — AB (ref >60)
GFR, EST NON AFRICAN AMERICAN: 22 mL/min — AB (ref >60)
Glucose, Bld: 212 mg/dL — ABNORMAL HIGH (ref 65–99)
POTASSIUM: 3.4 mmol/L — AB (ref 3.5–5.1)
Sodium: 138 mmol/L (ref 135–145)

## 2015-07-02 LAB — GLUCOSE, CAPILLARY
GLUCOSE-CAPILLARY: 170 mg/dL — AB (ref 65–99)
Glucose-Capillary: 206 mg/dL — ABNORMAL HIGH (ref 65–99)

## 2015-07-02 MED ORDER — POTASSIUM CHLORIDE CRYS ER 20 MEQ PO TBCR
40.0000 meq | EXTENDED_RELEASE_TABLET | Freq: Every day | ORAL | Status: DC
  Administered 2015-07-02: 40 meq via ORAL
  Filled 2015-07-02: qty 2

## 2015-07-02 MED ORDER — DOXAZOSIN MESYLATE 2 MG PO TABS
2.0000 mg | ORAL_TABLET | Freq: Two times a day (BID) | ORAL | Status: DC
  Administered 2015-07-02: 2 mg via ORAL
  Filled 2015-07-02: qty 1

## 2015-07-02 MED ORDER — TORSEMIDE 20 MG PO TABS: 60.0000 mg | ORAL_TABLET | Freq: Every day | ORAL | Status: DC

## 2015-07-02 MED ORDER — TORSEMIDE 20 MG PO TABS
60.0000 mg | ORAL_TABLET | Freq: Every day | ORAL | Status: DC
  Administered 2015-07-02: 60 mg via ORAL
  Filled 2015-07-02: qty 3

## 2015-07-02 MED ORDER — ISOSORBIDE MONONITRATE ER 60 MG PO TB24: 60.0000 mg | ORAL_TABLET | Freq: Every day | ORAL | Status: DC

## 2015-07-02 MED ORDER — DOXAZOSIN MESYLATE 2 MG PO TABS: 2.0000 mg | ORAL_TABLET | Freq: Two times a day (BID) | ORAL | Status: DC

## 2015-07-02 MED ORDER — HYDRALAZINE HCL 100 MG PO TABS: 100.0000 mg | ORAL_TABLET | Freq: Three times a day (TID) | ORAL | Status: DC

## 2015-07-02 NOTE — Progress Notes (Signed)
Discharge instructions reviewed with the patient and family.  Discussed medications, Diet and follow up appointments.  Discussed daily weights and instructed patient to call MD with a 3 or more pound weight gain. Copies of D/C instructions given to the patient. To door via wheelchair. Home via [place] with his cousin driving.

## 2015-07-02 NOTE — Care Management Note (Signed)
Case Management Note  Patient Details  Name: Leonard [name] MRN: [id number] Date of Birth: 12/31/1950  Subjective/Objective:                    Action/Plan: Patient returning home with [facility] RN services through [facility]. Butch Penny with [facility] called CM for resumption of care orders. MD to place.   Expected Discharge Date:                  Expected Discharge Plan:  [place]  In-House Referral:     Discharge planning Services  CM Consult  Post Acute Care Choice:  Resumption of Svcs/PTA Provider Choice offered to:     DME Arranged:    DME Agency:     HH Arranged:  RN [place] Agency:  [facility] [place]  Status of Service:  Completed, signed off  Medicare Important Message Given:    Date Medicare IM Given:    Medicare IM give by:    Date Additional Medicare IM Given:    Additional Medicare Important Message give by:     If discussed at [place] of Stay Meetings, dates discussed:    Additional Comments:  Pollie Friar, RN 07/02/2015, 2:36 PM

## 2015-07-02 NOTE — Progress Notes (Signed)
Patient ID: Leonard [name], male   DOB: [birth year], 65 y.o.   MRN: [id number]     Advanced Heart Failure Rounding Note  Referring Physician: Dr. Ardelia Mems Primary Physician: Clearance Coots, MD Primary Nephrologist: [facility] Primary Cardiologist: Previously Mare Ferrari  Reason for Consultation: A/C diastolic HF  Subjective:    Hgb stable at 7.8.  Had 1 unit PRBCs earlier in stay.  No overt bleeding but hemoccult +.    Weight down another 2 lbs.    SBP 140s-160s.   Objective:   Weight Range: 219 lb 3.2 oz (99.428 kg) Body mass index is 33.34 kg/(m^2).   Vital Signs:   Temp:  [98 F (36.7 C)-99.1 F (37.3 C)] 99.1 F (37.3 C) (05/29 0500) Pulse Rate:  [67-71] 71 (05/29 0500) Resp:  [17-20] 17 (05/29 0500) BP: (138-164)/(57-70) 164/70 mmHg (05/29 0500) SpO2:  [96 %-100 %] 99 % (05/29 0500) Weight:  [219 lb 3.2 oz (99.428 kg)] 219 lb 3.2 oz (99.428 kg) (05/29 0500) Last BM Date: 06/28/15  Weight change: Filed Weights   06/30/15 0610 07/01/15 0629 07/02/15 0500  Weight: 228 lb 8 oz (103.647 kg) 221 lb 8 oz (100.472 kg) 219 lb 3.2 oz (99.428 kg)    Intake/Output:   Intake/Output Summary (Last 24 hours) at 07/02/15 0750 Last data filed at 07/02/15 0618  Gross per 24 hour  Intake    942 ml  Output   3300 ml  Net  -2358 ml     Physical Exam: General: Well appearing. No resp difficulty HEENT: normal Neck: supple. No JVD. Carotids 2+ bilat; no bruits. No thyromegaly or nodule noted Cor: PMI nondisplaced. RRR. No rubs, gallops or murmurs appreciated Lungs: Slightly diminished throughout Abdomen: Obese, soft, NT, mildly distended. No hepatosplenomegaly. No bruits or masses. Good bowel sounds. Extremities: no cyanosis, clubbing, rash.   No edema.  Neuro: alert & orientedx3, cranial nerves grossly intact. moves all 4 extremities w/o difficulty. Affect pleasant  Telemetry: Reviewed, NSR 80s  Labs: CBC  Recent Labs  07/01/15 0522 07/02/15 0327  WBC 7.8 8.3  HGB  7.9* 7.8*  HCT 26.6* 26.4*  MCV 74.3* 74.6*  PLT 233 [id number]   Basic Metabolic Panel  Recent Labs  07/01/15 0522 07/02/15 0327  NA 138 138  K 3.4* 3.4*  CL 99* 99*  CO2 29 28  GLUCOSE 227* 212*  BUN 64* 63*  CREATININE 2.74* 2.83*  CALCIUM 8.7* 8.6*   Liver Function Tests No results for input(s): AST, ALT, ALKPHOS, BILITOT, PROT, ALBUMIN in the last 72 hours. No results for input(s): LIPASE, AMYLASE in the last 72 hours. Cardiac Enzymes No results for input(s): CKTOTAL, CKMB, CKMBINDEX, TROPONINI in the last 72 hours.  BNP: BNP (last 3 results)  Recent Labs  06/03/15 0225 06/18/15 1216 06/28/15 0320  BNP 489.2* 229.9* 467.1*    ProBNP (last 3 results) No results for input(s): PROBNP in the last 8760 hours.   D-Dimer No results for input(s): DDIMER in the last 72 hours. Hemoglobin A1C No results for input(s): HGBA1C in the last 72 hours. Fasting Lipid Panel No results for input(s): CHOL, HDL, LDLCALC, TRIG, CHOLHDL, LDLDIRECT in the last 72 hours. Thyroid Function Tests No results for input(s): TSH, T4TOTAL, T3FREE, THYROIDAB in the last 72 hours.  Invalid input(s): FREET3  Other results:     Imaging/Studies:  No results found.  Latest Echo  Latest Cath   Medications:     Scheduled Medications: . amLODipine  10 mg Oral Daily  . aspirin EC  81 mg Oral Daily  . atorvastatin  40 mg Oral q1800  . carvedilol  50 mg Oral BID WC  . doxazosin  2 mg Oral Q12H  . hydrALAZINE  100 mg Oral Q8H  . insulin aspart  0-15 Units Subcutaneous TID WC  . insulin aspart  0-5 Units Subcutaneous QHS  . insulin aspart  10 Units Subcutaneous TID WC  . insulin glargine  40 Units Subcutaneous BID  . isosorbide mononitrate  60 mg Oral Daily  . levothyroxine  50 mcg Oral QAC breakfast  . Living Better with Heart Failure Book   Does not apply Once  . pantoprazole  40 mg Oral Daily  . sodium chloride flush  3 mL Intravenous Q12H    Infusions:    PRN  Medications: sodium chloride, acetaminophen, albuterol, hydrALAZINE, ondansetron (ZOFRAN) IV, sodium chloride flush   Assessment/Plan   Leonard [name] is a 65 y.o. male with history of uncontrolled HTN, 123456, Chronic diastolic CHF Echo [id number] LVEF 60-65% with grade 2 DD and normal RV. 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 Presented with SOB, CP, and edema along with hypertensive urgency. HF team consulted with recent admission and need for med adjustment.  1. Acute on chronic diastolic CHF: Suspect potentiated by HTN and CKD.Volume looks better on exam. - Transition to torsemide 60 mg daily.   2. Hypertensive urgency: Renal artery dopplers with no renal artery stenosis.  - Continue Coreg, amlodipine. - Hydralazine increased to 100 mg tid.  - BP remains high, increase doxazosin to 2 mg bid.   3. CKD Stage III-IV: This makes BP control and CHF control worse. Stable so far. Sees nephrology as outpatient.  4. CAD: s/p CABG. Rare exertional chest pain. Dyspnea is a much more prominent symptom. Doubt ACS. Negative cardiac enzymes. We added Imdur 60 mg daily.  5. PAD: ABI severe decreased on right, moderate on left.  No rest pain or pedal ulcers.  He is on atorvastatin and ASA.  Not a candidate for cilostazol with CHF.  6. Anemia: Probably multifactorial with renal disease and heme+.  He had 1 unit PRBCs this admission.  Plan for EGD and c-scope (?this admit versus outpatient).   Loralie Champagne 07/02/2015 7:50 AM

## 2015-07-02 NOTE — Discharge Summary (Signed)
[facility] Discharge Summary  [name] Medical record number: [id number] Date of birth: Dec 07, 1950 Age: 65 y.o. Gender: male Date of Admission: 06/28/2015  Date of Discharge: 07/02/2015 Admitting Physician: Leeanne Rio, MD  Primary Care Provider: Clearance Coots, MD Consultants: Heart Failure Team  Indication for Hospitalization: Shortness of breath  Discharge Diagnoses/Problem List:  Acute on chronic diastolic heart failure Type 2 DM CKD Stage IIIB Uncontrolled HTN PVD s/p right 4-5th toe amputation CAD s/p CABG x 3 COPD HLD Hypothyroidism Microcytic anemia  Disposition: Home with [place], [place], [place]  Discharge Condition: Stable, improved  Discharge Exam:  General: Well-appearing, sitting up on the side of the bed, talkative, pleasant Neck: Supple, no JVD Cardiovascular: RRR, no murmurs, rubs or gallop. 2+ radial pulses bilaterally  Respiratory: CTAB, normal work of breathing Abdomen: +BS, soft, non-tender, non-distended MSK: No lower extremity edema Skin: No rashes or lesions Neuro: Awake, alert, oriented, CN 2-12 grossly intact, no focal deficits. Psych: Appropriate affect, normal behavior  Brief Hospital Course:  Alastor Tivnan is a 65 year old male with a PMH of diastolic heart failure, uncontrolled HTN, T2DM, HLD, CAD s/p CABG x 3, CKD stage IIIB, PAD s/p 4-5th digit amputation, hypothyroidism, COPD, microcytic anemia who presented to the ED with shortness of breath. He was initially requiring non-rebreather and was then transitioned to BiPAP. BNP was 467, up from 227 at last admission. Troponins were negative, EKG was unchanged, and CXR showed vascular congestion with pulmonary edema. Crackles were auscultated on exam and he had lower extremity edema to the mid shin. Hospital course is described by problem below.  Acute on Chronic Diastolic Heart Failure: Pt was given Lasix 40mg  x 1 in the ED. HF team was  consulted and he was started on Lasix 80mg  IV bid and Imdur 60mg  daily. He was initially requiring BiPAP on admission and was transitioned to [place], then to room air. He was diuresed for 3 days and then transitioned to Torsemide 60mg  daily. His weight decreased from 241lb on admission to 219lb on the day of discharge. He was net -8L during his hospitalization. His crackles resolved and he did not have any lower extremity edema on the day of discharge. Creatinine was stable.  Uncontrolled HTN: Pt had high BPs to the 170s-180s during his admission. 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 HF team added Imdur 60mg  daily, increased his Doxazosin from 1mg  daily to 2mg  bid, and increased his Hydralazine from 75mg  tid to 100mg  tid. Pt's BPs decreased to 130/54. He tolerated the medication adjustments without any issues and denied any dizziness.  Microcytic Anemia: Pt has a history of anemia that was thought to be anemia of chronic disease. He receives Aranesp q 2 weeks. During his hospitalization, Hgb dropped from 7.7 > 6.8. We repeated his CBC and his Hgb was 7.3. He was asymptomatic, so we decided to transfuse if Hgb dropped below 7.0. FOBT was performed and was positive. GI was consulted and recommended endoscopy and colonoscopy. Pt has never had a colonoscopy. GI could not perform these procedures over the weekend, so Pt was discharge home with the plan to have these procedures performed as an outpatient. Hgb stabilized and was 7.8 on the day of discharge. Pt did not require a transfusion during his hospitalization.  All of his other chronic medical conditions were stable and no changes  were made to his home medications.  Issues for Follow Up:  1. Pt's weight on the day of discharge was 219lb, down from 241lb on admission. Dry weight appears to be in the low 220s. He was discharged on Torsemide  60mg  daily. Please weight him and adjust Torsemide dose as appropriate. Recommend repeat BMET in 1 week. 2. Pt was readmitted within 6 days of discharge from his last hospitalization. Recommend PCP follow-up on 5/30, then nursing visit in clinic for weight check on 6/1 or 6/2, then follow-up again with PCP on 6/5 to ensure that he is not readmitted. He should also be seen in Heart Failure Clinic. 3. We spoke with Casimer Lanius, CSW who referred Pt to [facility] in order to provide him additional community resources. He was also discharged with a [place] for medication assistance and HHOT. 4. Adjustments were made to Pt's anti-hypertensive medications. He was discharged home on the following regimen: Norvasc 10mg  daily, Hydralazine 100mg  tid, Coreg 50mg  bid, Imdur 60mg  daily, and Doxazosin 2mg  bid. Please follow-up BPs. 5. Pt's hemoglobin dropped from 7.7 > 6.8 during his admission. FOBT was positive. GI consulted and recommended endoscopy/colonoscopy, but these could not be performed during the weekend. Please refer to GI to have these procedures performed.  Significant Procedures: None  Significant Labs and Imaging:   Recent Labs Lab 06/30/15 0317 07/01/15 0522 07/02/15 0327  WBC 8.3 7.8 8.3  HGB 7.3* 7.9* 7.8*  HCT 24.2* 26.6* 26.4*  PLT 223 233 238    Recent Labs Lab 06/28/15 0326  06/28/15 1629 06/29/15 0238 06/30/15 0317 07/01/15 0522 07/02/15 0327  NA 133*  < > 133* 135 139 138 138  K 3.5  < > 4.4 3.9 3.6 3.4* 3.4*  CL 100*  < > 100* 102 101 99* 99*  CO2 24  < > 20* 23 27 29 28   GLUCOSE 200*  < > 362* 334* 245* 227* 212*  BUN 44*  < > 53* 59* 68* 64* 63*  CREATININE 2.75*  < > 2.81* 2.84* 2.87* 2.74* 2.83*  CALCIUM 8.2*  < > 8.6* 8.4* 8.8* 8.7* 8.6*  ALKPHOS 76  --   --   --   --   --   --   AST 15  --   --   --   --   --   --   ALT 17  --   --   --   --   --   --   ALBUMIN 2.9*  --   --   --   --   --   --   < > = values in this interval not  displayed.  BNP 467 i-stat troponin: 0.02 Trop: 0.03 > 0.04 > <0.03 FOBT positive - CXR (5/25): Vascular congestion and mild cardiomegaly. 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 - Peripheral arterial duplex: severe arterial insufficiency on the right, moderate arterial sufficiency on the left  Results/Tests Pending at Time of Discharge: None  Discharge Medications:    Medication List    STOP taking these medications        furosemide 40 MG tablet  Commonly known as:  LASIX      TAKE these medications        acetaminophen 500 MG tablet  Commonly known as:  TYLENOL  Take 1 tablet (500 mg total) by mouth every 6 (  six) hours as needed for mild pain or moderate pain.     ADVAIR DISKUS 250-50 MCG/DOSE Aepb  Generic drug:  Fluticasone-Salmeterol  INHALE 1 PUFF INTO THE LUNGS 2 TIMES DAILY     albuterol 108 (90 Base) MCG/ACT inhaler  Commonly known as:  PROVENTIL HFA;VENTOLIN HFA  Inhale 2 puffs into the lungs every 6 (six) hours as needed for wheezing or shortness of breath.     amLODipine 10 MG tablet  Commonly known as:  NORVASC  Take 1 tablet (10 mg total) by mouth daily.     aspirin 81 MG EC tablet  Take 1 tablet (81 mg total) by mouth daily.     atorvastatin 40 MG tablet  Commonly known as:  LIPITOR  TAKE 1 TABLET BY MOUTH EVERY DAY AT 6 PM     carvedilol 25 MG tablet  Commonly known as:  COREG  Take 2 tablets (50 mg total) by mouth 2 (two) times daily with a meal.     doxazosin 2 MG tablet  Commonly known as:  CARDURA  Take 1 tablet (2 mg total) by mouth every 12 (twelve) hours.     hydrALAZINE 100 MG tablet  Commonly known as:  APRESOLINE  Take 1 tablet (100 mg total) by mouth every 8 (eight) hours.     insulin aspart 100 UNIT/ML FlexPen  Commonly known as:  NOVOLOG FLEXPEN  Inject 35 Units into the skin 3 (three) times daily with  meals.     Insulin Glargine 100 UNIT/ML Solostar Pen  Commonly known as:  LANTUS SOLOSTAR  INJECT 65 UNITS INTO THE SKIN 2 TIMES DAILY     isosorbide mononitrate 60 MG 24 hr tablet  Commonly known as:  IMDUR  Take 1 tablet (60 mg total) by mouth daily.     levothyroxine 50 MCG tablet  Commonly known as:  SYNTHROID, LEVOTHROID  Take 1 tablet (50 mcg total) by mouth daily before breakfast.     nitroGLYCERIN 0.4 MG SL tablet  Commonly known as:  NITROSTAT  Place 1 tablet (0.4 mg total) under the tongue every 5 (five) minutes as needed for chest pain.     pantoprazole 40 MG tablet  Commonly known as:  PROTONIX  Take 1 tablet (40 mg total) by mouth daily.     SURE COMFORT PEN NEEDLES 31G X 8 MM Misc  Generic drug:  Insulin Pen Needle  USE WITH BOTH INSULIN PENS TWICE DAILY (4 PER DAY)     torsemide 20 MG tablet  Commonly known as:  DEMADEX  Take 3 tablets (60 mg total) by mouth daily.        Discharge Instructions: Please refer to Patient Instructions section of EMR for full details.  Patient was counseled important signs and symptoms that should prompt return to medical care, changes in medications, dietary instructions, activity restrictions, and follow up appointments.   Follow-Up Appointments:     Follow-up Information    Follow up with Clearance Coots, MD On 07/03/2015.   Specialty:  Family Medicine   Why:  Hospital follow-up appointment at 4:15pm.   Contact information:   [place] [id number] [phone]       Sela Hua, MD 07/02/2015, 11:50 AM PGY-1, [place]

## 2015-07-02 NOTE — Progress Notes (Signed)
Family Medicine Teaching Service Daily Progress Note Intern Pager: [phone]  [name] Medical record number: [id number] Date of birth: [birth year] Age: 65 y.o. Gender: male  Primary Care Provider: Clearance Coots, MD Consultants: HF team Code Status: Full  Pt Overview and Major Events to Date:  5/25: Admitted to [facility] with CHF exacerbation  Assessment and Plan: Leonard [name] is a 65 y.o. male presenting with shortness of breath. 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  #Acute on chronic CHF: Likely 2/2 non-compliance. Weight 241 on admission > 221lb yesterday > 219lb (dry weight in the low 220s). UOP 3.3L. Net -7.8 L since admission. On exam, no crackles and no lower extremity edema.  - HF team recs: Has been on Lasix to 40 mg IV bid; transition to Torsemide 60mg  daily today. - Daily weights - Strict I/O's  #Microcytic Anemia: Likely iron deficiency with renal disease vs GI bleed. FOBT positive. Hgb stable 7.8 > 7.9. - GI: recommend EGD/Colonoscopy but unable to perform with propofol on the weekend. Can perform on Tuesday but if discharged, then will be performed outpatient.  - Takes Aranesp q2 weeks - Daily CBCs  #HTN: Improving. BPs 141/64, 164/70 most recently. No dizziness, no SOB, no palpitations, no chest pain. - Cardiology recommending increasing Doxazosin from 1mg  bid to 2mg  bid. - Continue home meds: Norvasc 10mg  daily, Hydralazine 100mg  tid, Coreg 50mg  bid. Imdur 60mg  daily added by HF team. - Will measure all BPs while patient is standing  #Type 2 DM: Poorly controlled. A1c 10.5%. On Lantus 65 units bid and Novolog 35 units tid with meals at home. CBGs 132-268 in the last 24 hours. Received 19 units of Novolog per sliding scale. - Lantus 40 units bid.  - Will increase Novolog from 10 units to 12 units tid. - Continue moderate SSI.  - CBGs four times a day.  #CKD  IIIB: Cr 2.74 > 2.83. Baseline 2.5-2.7. - Recommend Nephrology outpatient follow-up  #Severe arterial insuffiencey: observed on ABI's. - Continue ASA, Lipitor - Consider cardiac rehab  #Hypothyroidism: TSH 12.559 last admission. - Continue Synthroid 22mcg daily  #CAD s/p CABG x 3 / PAD / HLD:  - Continue home Lipitor, Coreg, ASA  FEN/GI: Heart healthy carb-modified diet, Protonix 40mg  daily Prophylaxis: Heparin sq  Disposition: Discharge possibly today. [facility] with [facility]. Resume HH services on discharge.  Subjective:  Pt states he is doing fine today. No chest pain, no shortness of breath, no dizziness. No concerns today. States he would like to go home today, but is fine to wait until tomorrow if we think that is best.  Objective: Temp:  [98 F (36.7 C)-99.1 F (37.3 C)] 99.1 F (37.3 C) (05/29 0500) Pulse Rate:  [67-71] 71 (05/29 0500) Resp:  [17-20] 17 (05/29 0500) BP: (138-164)/(57-70) 164/70 mmHg (05/29 0500) SpO2:  [96 %-100 %] 99 % (05/29 0500) Weight:  [219 lb 3.2 oz (99.428 kg)] 219 lb 3.2 oz (99.428 kg) (05/29 0500) Physical Exam: General: Well-appearing, sitting up on the side of the bed, talkative, pleasant Neck: Supple, no JVD Cardiovascular: RRR, no murmurs, rubs or gallop. 2+ radial pulses bilaterally  Respiratory: CTAB, normal work of breathing Abdomen: +BS, soft, non-tender, non-distended MSK: No lower extremity edema Skin: No rashes or lesions Neuro: Awake, alert, oriented, CN 2-12 grossly intact, no focal deficits. Psych: Appropriate affect, normal behavior  Laboratory:  Recent Labs Lab 06/30/15  [id number] 07/01/15 0522 07/02/15 0327  WBC 8.3 7.8 8.3  HGB 7.3* 7.9* 7.8*  HCT 24.2* 26.6* 26.4*  PLT 223 233 238    Recent Labs Lab 06/28/15 0326  06/30/15 0317 07/01/15 0522 07/02/15 0327  NA 133*  < > 139 138 138  K 3.5  < > 3.6 3.4* 3.4*  CL 100*  < > 101 99* 99*  CO2 24  < > 27 29 28   BUN 44*  < > 68* 64* 63*   CREATININE 2.75*  < > 2.87* 2.74* 2.83*  CALCIUM 8.2*  < > 8.8* 8.7* 8.6*  PROT 6.3*  --   --   --   --   BILITOT 0.5  --   --   --   --   ALKPHOS 76  --   --   --   --   ALT 17  --   --   --   --   AST 15  --   --   --   --   GLUCOSE 200*  < > 245* 227* 212*  < > = values in this interval not displayed. BNP 467 i-stat troponin: 0.02 Trop: 0.03 > 0.04 > <0.03 FOBT positive  Imaging/Diagnostic Tests: - CXR (5/25): Vascular congestion and mild cardiomegaly. 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 - Peripheral arterial duplex: severe arterial insufficiency on the right, moderate arterial sufficiency on the left  Sela Hua, MD 07/02/2015, 7:13 AM PGY-1, [place] Intern pager: [phone], text pages welcome

## 2015-07-03 ENCOUNTER — Encounter: Payer: Self-pay | Admitting: Family Medicine

## 2015-07-03 ENCOUNTER — Emergency Department (HOSPITAL_COMMUNITY)
Admission: EM | Admit: 2015-07-03 | Discharge: 2015-07-04 | Disposition: A | Discharge status: Home / Self Care | Payer: Medicaid Other | Attending: Emergency Medicine | Admitting: Emergency Medicine

## 2015-07-03 ENCOUNTER — Other Ambulatory Visit: Payer: Self-pay

## 2015-07-03 ENCOUNTER — Telehealth: Payer: Self-pay | Admitting: Licensed Clinical Social Worker

## 2015-07-03 ENCOUNTER — Ambulatory Visit (INDEPENDENT_AMBULATORY_CARE_PROVIDER_SITE_OTHER): Payer: Medicaid Other | Admitting: Family Medicine

## 2015-07-03 ENCOUNTER — Emergency Department (HOSPITAL_COMMUNITY): Payer: Medicaid Other

## 2015-07-03 ENCOUNTER — Encounter (HOSPITAL_COMMUNITY): Payer: Self-pay | Admitting: *Deleted

## 2015-07-03 ENCOUNTER — Telehealth: Payer: Self-pay | Admitting: *Deleted

## 2015-07-03 VITALS — BP 127/59 | HR 66 | Temp 98.2°F | Ht 68.0 in | Wt 222.4 lb

## 2015-07-03 DIAGNOSIS — Z9861 Coronary angioplasty status: Secondary | ICD-10-CM | POA: Insufficient documentation

## 2015-07-03 DIAGNOSIS — E7800 Pure hypercholesterolemia, unspecified: Secondary | ICD-10-CM | POA: Diagnosis not present

## 2015-07-03 DIAGNOSIS — Z7982 Long term (current) use of aspirin: Secondary | ICD-10-CM | POA: Diagnosis not present

## 2015-07-03 DIAGNOSIS — R5383 Other fatigue: Secondary | ICD-10-CM | POA: Diagnosis present

## 2015-07-03 DIAGNOSIS — Z9889 Other specified postprocedural states: Secondary | ICD-10-CM | POA: Diagnosis not present

## 2015-07-03 DIAGNOSIS — Z87891 Personal history of nicotine dependence: Secondary | ICD-10-CM | POA: Diagnosis not present

## 2015-07-03 DIAGNOSIS — I5033 Acute on chronic diastolic (congestive) heart failure: Secondary | ICD-10-CM | POA: Diagnosis not present

## 2015-07-03 DIAGNOSIS — I509 Heart failure, unspecified: Secondary | ICD-10-CM | POA: Diagnosis not present

## 2015-07-03 DIAGNOSIS — D649 Anemia, unspecified: Secondary | ICD-10-CM | POA: Insufficient documentation

## 2015-07-03 DIAGNOSIS — I10 Essential (primary) hypertension: Secondary | ICD-10-CM | POA: Diagnosis not present

## 2015-07-03 DIAGNOSIS — Z951 Presence of aortocoronary bypass graft: Secondary | ICD-10-CM | POA: Insufficient documentation

## 2015-07-03 DIAGNOSIS — Z794 Long term (current) use of insulin: Secondary | ICD-10-CM | POA: Insufficient documentation

## 2015-07-03 DIAGNOSIS — Z79899 Other long term (current) drug therapy: Secondary | ICD-10-CM | POA: Insufficient documentation

## 2015-07-03 DIAGNOSIS — E119 Type 2 diabetes mellitus without complications: Secondary | ICD-10-CM | POA: Insufficient documentation

## 2015-07-03 LAB — BASIC METABOLIC PANEL WITH GFR
BUN: 63 mg/dL — AB (ref 7–25)
CHLORIDE: 102 mmol/L (ref 98–110)
CO2: 26 mmol/L (ref 20–31)
CREATININE: 2.91 mg/dL — AB (ref 0.70–1.25)
Calcium: 8.7 mg/dL (ref 8.6–10.3)
GFR, EST AFRICAN AMERICAN: 25 mL/min — AB (ref >=60)
GFR, Est Non African American: 22 mL/min — ABNORMAL LOW (ref >=60)
Glucose, Bld: 162 mg/dL — ABNORMAL HIGH (ref 65–99)
POTASSIUM: 4.3 mmol/L (ref 3.5–5.3)
SODIUM: 140 mmol/L (ref 135–146)

## 2015-07-03 LAB — I-STAT VENOUS BLOOD GAS, ED
Acid-Base Excess: 1 mmol/L (ref 0.0–2.0)
BICARBONATE: 25 meq/L — AB (ref 20.0–24.0)
O2 Saturation: 89 %
PCO2 VEN: 36.4 mmHg — AB (ref 45.0–50.0)
TCO2: 26 mmol/L (ref 0–100)
pH, Ven: 7.444 — ABNORMAL HIGH (ref 7.250–7.300)
pO2, Ven: 53 mmHg — ABNORMAL HIGH (ref 31.0–45.0)

## 2015-07-03 LAB — COMPREHENSIVE METABOLIC PANEL
ALBUMIN: 3 g/dL — AB (ref 3.5–5.0)
ALT: 14 U/L — ABNORMAL LOW (ref 17–63)
ANION GAP: 8 (ref 5–15)
AST: 14 U/L — AB (ref 15–41)
Alkaline Phosphatase: 68 U/L (ref 38–126)
BILIRUBIN TOTAL: 0.3 mg/dL (ref 0.3–1.2)
BUN: 66 mg/dL — ABNORMAL HIGH (ref 6–20)
CHLORIDE: 105 mmol/L (ref 101–111)
CO2: 25 mmol/L (ref 22–32)
Calcium: 8.6 mg/dL — ABNORMAL LOW (ref 8.9–10.3)
Creatinine, Ser: 3.07 mg/dL — ABNORMAL HIGH (ref 0.61–1.24)
GFR calc Af Amer: 23 mL/min — ABNORMAL LOW (ref >60)
GFR calc non Af Amer: 20 mL/min — ABNORMAL LOW (ref >60)
GLUCOSE: 190 mg/dL — AB (ref 65–99)
POTASSIUM: 3.9 mmol/L (ref 3.5–5.1)
SODIUM: 138 mmol/L (ref 135–145)
TOTAL PROTEIN: 6 g/dL — AB (ref 6.5–8.1)

## 2015-07-03 LAB — CBC WITH DIFFERENTIAL/PLATELET
BASOS ABS: 0 10*3/uL (ref 0.0–0.1)
Basophils Relative: 0 %
EOS ABS: 0.2 10*3/uL (ref 0.0–0.7)
Eosinophils Relative: 3 %
HCT: 24.4 % — ABNORMAL LOW (ref 39.0–52.0)
Hemoglobin: 7.3 g/dL — ABNORMAL LOW (ref 13.0–17.0)
LYMPHS ABS: 1.5 10*3/uL (ref 0.7–4.0)
Lymphocytes Relative: 19 %
MCH: 22.9 pg — ABNORMAL LOW (ref 26.0–34.0)
MCHC: 29.9 g/dL — AB (ref 30.0–36.0)
MCV: 76.5 fL — ABNORMAL LOW (ref 78.0–100.0)
MONO ABS: 0.6 10*3/uL (ref 0.1–1.0)
Monocytes Relative: 7 %
NEUTROS ABS: 5.8 10*3/uL (ref 1.7–7.7)
Neutrophils Relative %: 71 %
PLATELETS: 235 10*3/uL (ref 150–400)
RBC: 3.19 MIL/uL — AB (ref 4.22–5.81)
RDW: 19.9 % — AB (ref 11.5–15.5)
WBC: 8.1 10*3/uL (ref 4.0–10.5)

## 2015-07-03 LAB — I-STAT TROPONIN, ED: Troponin i, poc: 0.01 ng/mL (ref 0.00–0.08)

## 2015-07-03 LAB — POCT HEMOGLOBIN: HEMOGLOBIN: 7.9 g/dL — AB (ref 14.1–18.1)

## 2015-07-03 LAB — CBG MONITORING, ED: Glucose-Capillary: 209 mg/dL — ABNORMAL HIGH (ref 65–99)

## 2015-07-03 MED ORDER — FUROSEMIDE 10 MG/ML IJ SOLN
40.0000 mg | Freq: Once | INTRAMUSCULAR | Status: AC
  Administered 2015-07-03: 40 mg via INTRAVENOUS
  Filled 2015-07-03: qty 4

## 2015-07-03 NOTE — ED Notes (Signed)
Patient presents via EMS.   EMS reported the family called 911 due to the fact that the patient was "more sleepy" today than usual.  Stated he was dx with "slight sleep apnea" and if he falls asleep he might not wake up.  Patient denies SOB, CP, etc.  States he does not know why his family called EMS.  EMS reported BP 143/74, HR 64 reg, 93%RA CBG 165  Patient was discharged on 5/29 dx SOB

## 2015-07-03 NOTE — ED Provider Notes (Signed)
CSN: [id number]     Arrival date & time 07/03/15  2025 History   First MD Initiated Contact with Patient 07/03/15 2030     Chief Complaint  Patient presents with  . Fatigue    HPI History of CHF with recent exacerbation, CAD, HTN, DM as urethra violation of fatigue and increased sleepiness. Patient states he feels well but his family matters made, but is more somnolent. He denies recent head trauma or falls. No headaches no vision changes, weakness, sensory loss, gait disturbance. There is no chest pain shortness of breath, leg swelling, hemoptysis, sputum, cough, fever, abd pain, nausea, vomiting, diarrhea, melena, bloody stools. Recently discharged for CHF exacerbation, left yesterday. Denies orthopnea, DOE, PND, leg swelling, weight gain, change in urinary output. Hishemoglobin was dropping he was transfused 1 unit. Special he get endoscopy/colonoscopy however was unable to use over the weekend. He denies melena or bloody stools or history of easy bleeding. He does not use CPAP at night.  Past Medical History  Diagnosis Date  . Hypertension   . PAD (peripheral artery disease) ([place])   . Gangrene of toe ([place])     right 5th/notes 01/04/2013   . High cholesterol   . Type II diabetes mellitus ([place])   . 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 CHF (congestive heart failure) ([place])   . Shortness of breath dyspnea    Past Surgical History  Procedure Laterality Date  . Throat surgery  1983    polyps  . Angioplasty / stenting femoral Right 01/13/2013    superficial femoral artery x 2 (3 mm x 60 mm, 4 mm x 60 mm)  . Tonsillectomy and adenoidectomy  ~ 1965  . Amputation Right 01/28/2013    Procedure: RAY AMPUTATION RIGHT  4th & 5th TOE;  Surgeon: Rosetta Posner, MD;  Location: [facility] OR;  Service: Vascular;  Laterality: Right;  . Coronary artery bypass graft N/A 04/07/2013    Procedure: CORONARY ARTERY BYPASS GRAFTING (CABG);  Surgeon: Ivin Poot, MD;  Location: [place];   Service: Open Heart Surgery;  Laterality: N/A;  . Intraoperative transesophageal echocardiogram N/A 04/07/2013    Procedure: INTRAOPERATIVE TRANSESOPHAGEAL ECHOCARDIOGRAM;  Surgeon: Ivin Poot, MD;  Location: [place];  Service: Open Heart Surgery;  Laterality: N/A;  . Lower extremity angiogram Bilateral 01/06/2013    Procedure: LOWER EXTREMITY ANGIOGRAM;  Surgeon: Conrad [place], MD;  Location: [facility] CATH LAB;  Service: Cardiovascular;  Laterality: Bilateral;  . Lower extremity angiogram Right 01/13/2013    Procedure: LOWER EXTREMITY ANGIOGRAM;  Surgeon: Conrad Staunton, MD;  Location: [facility] CATH LAB;  Service: Cardiovascular;  Laterality: Right;  . Left heart catheterization with coronary angiogram N/A 04/05/2013    Procedure: LEFT HEART CATHETERIZATION WITH CORONARY ANGIOGRAM;  Surgeon: Peter M Martinique, MD;  Location: [facility] CATH LAB;  Service: Cardiovascular;  Laterality: N/A;  . Skin cancer excision  2017   Family History  Problem Relation Age of Onset  . Cancer Mother     lung cancer  . Hypertension Mother   . Cancer Sister     patient thinks it was uterine cancer  . Hypertension Sister   . Heart attack Sister   . Stroke Neg Hx    Social History  Substance Use Topics  . Smoking status: Former Smoker -- 1.00 packs/day for 46 years    Types: Cigarettes    Start date: 02/04/1967    Quit date: 02/03/2013  . Smokeless tobacco: Never Used  Comment: Doing well with quitting.  . Alcohol Use: No    Review of Systems  Constitutional: Positive for fatigue. Negative for fever and chills.  Respiratory: Negative for cough, shortness of breath and wheezing.   Cardiovascular: Negative for chest pain and leg swelling.  Gastrointestinal: Negative for nausea, vomiting, abdominal pain, diarrhea, blood in stool and anal bleeding.  Genitourinary: Negative for hematuria.  Musculoskeletal: Negative for back pain.  Skin: Negative for rash.  Neurological: Negative for syncope.  Psychiatric/Behavioral: Negative  for confusion.  All other systems reviewed and are negative.     Allergies  Review of patient's allergies indicates no known allergies.  Home Medications   Prior to Admission medications   Medication Sig Start Date End Date Taking? Authorizing Provider  acetaminophen (TYLENOL) 500 MG tablet Take 1 tablet (500 mg total) by mouth every 6 (six) hours as needed for mild pain or moderate pain. 06/06/15   Mercy Riding, MD  ADVAIR DISKUS 250-50 MCG/DOSE AEPB INHALE 1 PUFF INTO THE LUNGS 2 TIMES DAILY Patient not taking: Reported on 06/28/2015 06/25/15   Rosemarie Ax, MD  albuterol (PROVENTIL HFA;VENTOLIN HFA) 108 (90 Base) MCG/ACT inhaler Inhale 2 puffs into the lungs every 6 (six) hours as needed for wheezing or shortness of breath. Patient not taking: Reported on 06/28/2015 06/12/15   Rogue Bussing, MD  amLODipine (NORVASC) 10 MG tablet Take 1 tablet (10 mg total) by mouth daily. 03/23/15   Zenia Resides, MD  aspirin EC 81 MG EC tablet Take 1 tablet (81 mg total) by mouth daily. 06/06/15   Mercy Riding, MD  atorvastatin (LIPITOR) 40 MG tablet TAKE 1 TABLET BY MOUTH EVERY DAY AT 6 PM 03/23/15   Zenia Resides, MD  carvedilol (COREG) 25 MG tablet Take 2 tablets (50 mg total) by mouth 2 (two) times daily with a meal. 04/05/15   Zenia Resides, MD  doxazosin (CARDURA) 2 MG tablet Take 1 tablet (2 mg total) by mouth every 12 (twelve) hours. 07/02/15   Sela Hua, MD  hydrALAZINE (APRESOLINE) 100 MG tablet Take 1 tablet (100 mg total) by mouth every 8 (eight) hours. 07/02/15   Sela Hua, MD  insulin aspart (NOVOLOG FLEXPEN) 100 UNIT/ML FlexPen Inject 35 Units into the skin 3 (three) times daily with meals. 05/17/15   Zenia Resides, MD  Insulin Glargine (LANTUS SOLOSTAR) 100 UNIT/ML Solostar Pen INJECT 65 UNITS INTO THE SKIN 2 TIMES DAILY 06/26/15   Rosemarie Ax, MD  isosorbide mononitrate (IMDUR) 60 MG 24 hr tablet Take 1 tablet (60 mg total) by mouth daily. 07/02/15   Sela Hua, MD  levothyroxine (SYNTHROID, LEVOTHROID) 50 MCG tablet Take 1 tablet (50 mcg total) by mouth daily before breakfast. 06/22/15   Sela Hua, MD  nitroGLYCERIN (NITROSTAT) 0.4 MG SL tablet Place 1 tablet (0.4 mg total) under the tongue every 5 (five) minutes as needed for chest pain. 06/06/15   Mercy Riding, MD  pantoprazole (PROTONIX) 40 MG tablet Take 1 tablet (40 mg total) by mouth daily. 06/06/15   Mercy Riding, MD  SURE COMFORT PEN NEEDLES 31G X 8 MM MISC USE WITH BOTH INSULIN PENS TWICE DAILY (4 PER DAY) 06/14/15   Olin Hauser, DO  torsemide (DEMADEX) 20 MG tablet Take 3 tablets (60 mg total) by mouth daily. 07/02/15   Sela Hua, MD   BP 136/72 mmHg  Pulse 60  Temp(Src) 98.2 F (36.8 C) (Oral)  Resp 17  Wt 100.699 kg  SpO2 96% Physical Exam  Constitutional: He is oriented to person, place, and time. He appears well-developed and well-nourished. No distress.  Alert, interactive, 'I feel fine",  Not somnolent  HENT:  Head: Normocephalic and atraumatic.  Nose: Nose normal.  Eyes: Conjunctivae are normal.  No conj pallor  Neck: Normal range of motion. Neck supple. No JVD present. No tracheal deviation present.  Cardiovascular: Normal rate, regular rhythm and normal heart sounds.   No murmur heard. Pulmonary/Chest: Effort normal and breath sounds normal. No respiratory distress. He has no wheezes. He has no rales. He exhibits no tenderness.  Abdominal: Soft. Bowel sounds are normal. He exhibits no distension and no mass. There is no tenderness.  Musculoskeletal: Normal range of motion. He exhibits edema (trace at ankles equal). He exhibits no tenderness.  Neurological: He is alert and oriented to person, place, and time.  Alert and oriented x3. CN 3-12 tested and without deficit. 5/5 muscle strength in all extremities with flexion and extension.  Normal bulk and tone.  No sensory deficit to light touch.  gait normal.  Normal  finger-to-nose.     Skin: Skin is warm  and dry. No rash noted.  Psychiatric: He has a normal mood and affect.  Nursing note and vitals reviewed.   ED Course  Procedures (including critical care time) Labs Review Labs Reviewed  CBC WITH DIFFERENTIAL/PLATELET - Abnormal; Notable for the following:    RBC 3.19 (*)    Hemoglobin 7.3 (*)    HCT 24.4 (*)    MCV 76.5 (*)    MCH 22.9 (*)    MCHC 29.9 (*)    RDW 19.9 (*)    All other components within normal limits  COMPREHENSIVE METABOLIC PANEL - Abnormal; Notable for the following:    Glucose, Bld 190 (*)    BUN 66 (*)    Creatinine, Ser 3.07 (*)    Calcium 8.6 (*)    Total Protein 6.0 (*)    Albumin 3.0 (*)    AST 14 (*)    ALT 14 (*)    GFR calc non Af Amer 20 (*)    GFR calc Af Amer 23 (*)    All other components within normal limits  CBG MONITORING, ED - Abnormal; Notable for the following:    Glucose-Capillary 209 (*)    All other components within normal limits  I-STAT VENOUS BLOOD GAS, ED - Abnormal; Notable for the following:    pH, Ven 7.444 (*)    pCO2, Ven 36.4 (*)    pO2, Ven 53.0 (*)    Bicarbonate 25.0 (*)    All other components within normal limits  URINALYSIS, ROUTINE W REFLEX MICROSCOPIC (NOT AT [facility])  BLOOD GAS, VENOUS  I-STAT TROPOININ, ED  I-STAT TROPOININ, ED    Imaging Review Dg Chest 2 View  07/03/2015  CLINICAL DATA:  Acute onset of generalized chest pain and weakness. Initial encounter. EXAM: CHEST  2 VIEW COMPARISON:  Chest radiograph from 06/28/2015 FINDINGS: The lungs are hypoexpanded. Vascular congestion is noted. Increased interstitial markings raise concern for pulmonary edema. No pleural effusion or pneumothorax is seen. The heart is mildly enlarged. The patient is status post median sternotomy, with evidence of prior CABG. No acute osseous abnormalities are seen. IMPRESSION: Lungs hypoexpanded. Vascular congestion and mild cardiomegaly. Increased interstitial markings raise concern for pulmonary edema. Electronically Signed   By:  Garald Balding M.D.   On: 07/03/2015 21:31   I  have personally reviewed and evaluated these images and lab results as part of my medical decision-making.   EKG Interpretation None      MDM   Final diagnoses:  Other fatigue  Anemia, unspecified anemia type  Congestive heart failure, unspecified congestive heart failure chronicity, unspecified congestive heart failure type [facility])   Reviewed recent hospital course was discharged yesterday. Very well-appearing vital signs stable alert interactive not somnolent. Neuro exam benign. He does have microcytic anemia. Reviewed concern for GI source during hospital course. Pt denies melena or bloody stool. No abd pain.  Vitals stable, no tachycardia or hypotension. Hgb near prior levels during ED stay. He has GI f/u arranged. Recommended he see PCP for lab recheck in 2 days and discuss further diagnostic plan to be coordinated.  Endorses mild fatigue, not somnolent, he did not want to come to the ED since hes been feeling better since discharge. Pulmonary edema on CXR, no reps sx to correlate.  Clinically appears euvolemic. No classic CHF signs by HPI.  Gave lasix x1 here. EKG without new changes.  He will return for worsenign fatigue, bloody stools.    Tammy Sours, MD 07/03/15 [id number]  Julianne Rice, MD 07/04/15 [phone]

## 2015-07-03 NOTE — ED Notes (Signed)
This nurse called both sisters to come for him.  They will attempt to have someone come for him.

## 2015-07-03 NOTE — Telephone Encounter (Signed)
Called to get verbal order to continue services that patient was receiving before hospital stay, verbal was given by Dr.Schmitz and doug was informed. Leonard [name], [name], [name]

## 2015-07-03 NOTE — ED Notes (Signed)
Pt taken to radiology via bed by Jamelle Haring

## 2015-07-03 NOTE — ED Notes (Signed)
Pt attempted to void with urinal, but stated "I can't pee" and was unable to do so. Urinal left at bedside.

## 2015-07-03 NOTE — Discharge Instructions (Signed)
Your blood count is low, please make sure you see your family doctor for re-check in 2 days. Come back to ED if you are having black or bloody stools, pass out, abd pain, worsening shortness of breath.

## 2015-07-03 NOTE — Progress Notes (Signed)
Patient ID: Leonard [name], male   DOB: 11/29/1950, 65 y.o.   MRN: [id number]  CSW met with patient, Sister Joyce and neighbor during office visit with PCP.  Provided patient and sister with contact number for [name] RN [phone] care manager with [facility]. (per [name] unable to reach patient) Sister will contact [name]. The following information was discussed: services provided by [facility] ( [facility]), explained the difference between home health and [facility] services,  long-term plans for patient's care and personal care services.  Patient and family are interested in personal care services (PCS) to assist with activities of daily living. Per sister, patient is having difficulty and unable to dress or bath himself.  CSW discussed with PCP possible [facility] referral. [facility] form provided to PCP.  CSW will fax form when completed by PCP.   . LCSWA Clinical Social Work,  [phone] 4:58 PM  

## 2015-07-03 NOTE — ED Notes (Signed)
Pt ambulated approx 10 steps with 2 techs assisting. Initially, pt felt comfortable in ambulating, but after the 10 steps he stated he felt weak and his gait became unsteady. Pt was able to turn around and get back to room with assistance from techs. Pt immediately fell back asleep once on bed.   Initial HR and spO2: 62 and 97% Final HR and spO2: 48 and 100%   Pamala Hurry RN made aware

## 2015-07-03 NOTE — Patient Instructions (Signed)
Thank you for coming in,   Please call me tomorrow to see how you are doing.   Please go the emergency room if you are having severe chest pain or unable to breath.   Please bring all of your medications with you to each visit.   Health maintenance items that are due.  Health Maintenance  Topic Date Due  . Colon Cancer Screening  06/21/2001  . Shingles Vaccine  06/22/2011  . Eye exam for diabetics  02/04/2015  . Flu Shot  09/04/2015  . Complete foot exam   11/21/2015  . Hemoglobin A1C  12/03/2015  . Pneumococcal vaccine (2) 01/05/2018  . Tetanus Vaccine  11/20/2024  .  Hepatitis C: One time screening is recommended by Center for Disease Control  (CDC) for  adults born from [birth year] through 1965.   Completed  . HIV Screening  Completed     Sign up for My Chart to have easy access to your labs results, and communication with your Primary care physician   Please feel free to call with any questions or concerns at any time, at [phone]. --Dr. Raeford Razor

## 2015-07-03 NOTE — Telephone Encounter (Signed)
CSW received call from [place], [place] with [facility] case management services. Patient is currently active with [facility].  They have not been successful in reaching patient for follow up.  Patient has an appointment today with PCP.    CSW will meet with patient and provide contact number for [name], [facility] will continue to follow patient and assist with management of his chronic conditions.  Casimer Lanius. [place] Work,  [phone] 10:20 AM

## 2015-07-04 LAB — URINE MICROSCOPIC-ADD ON: RBC / HPF: NONE SEEN RBC/hpf (ref 0–5)

## 2015-07-04 LAB — I-STAT TROPONIN, ED: Troponin i, poc: 0.01 ng/mL (ref 0.00–0.08)

## 2015-07-04 LAB — URINALYSIS, ROUTINE W REFLEX MICROSCOPIC
BILIRUBIN URINE: NEGATIVE
GLUCOSE, UA: NEGATIVE mg/dL
HGB URINE DIPSTICK: NEGATIVE
KETONES UR: NEGATIVE mg/dL
Nitrite: NEGATIVE
PH: 5.5 (ref 5.0–8.0)
PROTEIN: 100 mg/dL — AB
Specific Gravity, Urine: 1.015 (ref 1.005–1.030)

## 2015-07-04 NOTE — ED Notes (Signed)
Ride here to take patient home - Dr Roxanne Mins aware

## 2015-07-04 NOTE — ED Notes (Signed)
Discharge instructions reviewed with patient and family member - voiced understanding

## 2015-07-04 NOTE — Assessment & Plan Note (Signed)
Appears to have his HF exacerbation cleared.  His dry weight appears to be around 220 lbs.  - advised to check his weight daily and call if his weight is more than three pounds greater than baseline.

## 2015-07-04 NOTE — Progress Notes (Signed)
   Subjective:    Leonard [name] - 65 y.o. male MRN [id number]  Date of birth: January 08, 1951  CC acute on chronic CHF  HPI  Leonard [name] is here for acute on chronic CHF.   He was discharged from the hospital on Monday at around 5 pm. He was admitted for a HF exacerbation and also found to be anemic. He was diuresed and transfused during his stay. He was unable to be scoped while he was admitted so he has follow up with GI as an outpatient.   He present today feeling very sleepy. He didn't get much sleep through his admission. He went to bed last night at midnight and woke up at 4 am. He went to his rocker after that to sleep. His neighbor found his at 9:30 still groggy. He hasn't woke up much throughout the day. He has eaten one meal at breakfast that included beans.  He denies using any oxygen. denies using illicit drugs.   Reports to taking his medications today. Hasn't been drinking much and abiding to his fluid restriction. Reports having intermittent chest pain and has been taking nitro with minimal to no improvement.   Health Maintenance:  Health Maintenance Due  Topic Date Due  . COLONOSCOPY  06/21/2001  . ZOSTAVAX  06/22/2011  . OPHTHALMOLOGY EXAM  02/04/2015    Review of Systems See HPI     Objective:   Physical Exam BP 127/59 mmHg  Pulse 66  Temp(Src) 98.2 F (36.8 C) (Oral)  Ht 5\' 8"  (1.727 m)  Wt 222 lb 6.4 oz (100.88 kg)  BMI 33.82 kg/m2  SpO2 99% Gen: NAD, alert, cooperative with exam, falls asleep throughout exam CV: RRR, good S1/S2, no murmur, no edema  Resp: CTABL, no wheezes, non-labored Skin: no rashes, normal turgor  Neuro: no gross deficits.     Assessment & Plan:   Acute on chronic diastolic heart failure Appears to have his HF exacerbation cleared.  His dry weight appears to be around 220 lbs.  - advised to check his weight daily and call if his weight is more than three pounds greater than baseline.   Normocytic anemia Patient was found to  anemic while admitted.  GI plans to perform a EGD/colonoscopy as outpatient.  - will place referral to GI if needed.

## 2015-07-04 NOTE — Assessment & Plan Note (Signed)
Patient was found to anemic while admitted.  GI plans to perform a EGD/colonoscopy as outpatient.  - will place referral to GI if needed.

## 2015-07-05 ENCOUNTER — Encounter: Payer: Self-pay | Admitting: Hematology and Oncology

## 2015-07-05 ENCOUNTER — Other Ambulatory Visit: Payer: Self-pay | Admitting: Hematology and Oncology

## 2015-07-05 ENCOUNTER — Ambulatory Visit (HOSPITAL_BASED_OUTPATIENT_CLINIC_OR_DEPARTMENT_OTHER): Payer: Medicaid Other

## 2015-07-05 ENCOUNTER — Other Ambulatory Visit (HOSPITAL_BASED_OUTPATIENT_CLINIC_OR_DEPARTMENT_OTHER): Payer: Medicaid Other

## 2015-07-05 ENCOUNTER — Other Ambulatory Visit: Payer: Medicaid Other

## 2015-07-05 ENCOUNTER — Telehealth: Payer: Self-pay | Admitting: *Deleted

## 2015-07-05 ENCOUNTER — Ambulatory Visit: Payer: Medicaid Other | Admitting: Family Medicine

## 2015-07-05 ENCOUNTER — Ambulatory Visit (HOSPITAL_COMMUNITY)
Admission: RE | Admit: 2015-07-05 | Discharge: 2015-07-05 | Disposition: A | Discharge status: Home / Self Care | Payer: Medicaid Other | Source: Ambulatory Visit | Attending: Hematology and Oncology | Admitting: Hematology and Oncology

## 2015-07-05 VITALS — BP 125/61 | HR 71 | Temp 98.4°F | Resp 18

## 2015-07-05 DIAGNOSIS — D631 Anemia in chronic kidney disease: Secondary | ICD-10-CM

## 2015-07-05 DIAGNOSIS — N189 Chronic kidney disease, unspecified: Principal | ICD-10-CM

## 2015-07-05 DIAGNOSIS — D472 Monoclonal gammopathy: Secondary | ICD-10-CM

## 2015-07-05 DIAGNOSIS — I509 Heart failure, unspecified: Secondary | ICD-10-CM | POA: Diagnosis not present

## 2015-07-05 LAB — CBC & DIFF AND RETIC
BASO%: 0.1 % (ref 0.0–2.0)
Basophils Absolute: 0 10*3/uL (ref 0.0–0.1)
EOS%: 2.1 % (ref 0.0–7.0)
Eosinophils Absolute: 0.2 10*3/uL (ref 0.0–0.5)
HCT: 25.3 % — ABNORMAL LOW (ref 38.4–49.9)
HGB: 7.9 g/dL — ABNORMAL LOW (ref 13.0–17.1)
IMMATURE RETIC FRACT: 5.5 % (ref 3.00–10.60)
LYMPH%: 20.2 % (ref 14.0–49.0)
MCH: 23.4 pg — AB (ref 27.2–33.4)
MCHC: 31.2 g/dL — AB (ref 32.0–36.0)
MCV: 74.9 fL — AB (ref 79.3–98.0)
MONO#: 0.8 10*3/uL (ref 0.1–0.9)
MONO%: 9.6 % (ref 0.0–14.0)
NEUT%: 68 % (ref 39.0–75.0)
NEUTROS ABS: 5.3 10*3/uL (ref 1.5–6.5)
PLATELETS: 226 10*3/uL (ref 140–400)
RBC: 3.38 10*6/uL — AB (ref 4.20–5.82)
RDW: 19.8 % — ABNORMAL HIGH (ref 11.0–14.6)
Retic %: 2.88 % — ABNORMAL HIGH (ref 0.80–1.80)
Retic Ct Abs: 97.34 10*3/uL — ABNORMAL HIGH (ref 34.80–93.90)
WBC: 7.8 10*3/uL (ref 4.0–10.3)
lymph#: 1.6 10*3/uL (ref 0.9–3.3)

## 2015-07-05 LAB — COMPREHENSIVE METABOLIC PANEL
ALT: 12 U/L (ref 0–55)
ANION GAP: 11 meq/L (ref 3–11)
AST: 11 U/L (ref 5–34)
Albumin: 3.4 g/dL — ABNORMAL LOW (ref 3.5–5.0)
Alkaline Phosphatase: 86 U/L (ref 40–150)
BUN: 75.1 mg/dL — ABNORMAL HIGH (ref 7.0–26.0)
CALCIUM: 8.9 mg/dL (ref 8.4–10.4)
CHLORIDE: 101 meq/L (ref 98–109)
CO2: 26 meq/L (ref 22–29)
CREATININE: 3.2 mg/dL — AB (ref 0.7–1.3)
EGFR: 23 mL/min/{1.73_m2} — AB (ref >90)
Glucose: 89 mg/dl (ref 70–140)
POTASSIUM: 3.7 meq/L (ref 3.5–5.1)
Sodium: 139 mEq/L (ref 136–145)
Total Bilirubin: 0.36 mg/dL (ref 0.20–1.20)
Total Protein: 7.1 g/dL (ref 6.4–8.3)

## 2015-07-05 LAB — ABO/RH: ABO/RH(D): B NEG

## 2015-07-05 MED ORDER — DARBEPOETIN ALFA 300 MCG/0.6ML IJ SOSY
300.0000 ug | PREFILLED_SYRINGE | Freq: Once | INTRAMUSCULAR | Status: AC
  Administered 2015-07-05: 300 ug via SUBCUTANEOUS
  Filled 2015-07-05: qty 0.6

## 2015-07-05 NOTE — Progress Notes (Unsigned)
I reviewed the CBC from the patient. He was recently seen in the emergency department because of fatigue and needed blood transfusion for severe anemia. He remained anemic. I will increase Aranesp to 500 mcg and recommend 1 unit of blood transfusion.

## 2015-07-05 NOTE — Telephone Encounter (Signed)
Per staff message and POF I have scheduled appts. Advised scheduler of appts. JMW  

## 2015-07-05 NOTE — Patient Instructions (Signed)
Darbepoetin Alfa injection What is this medicine? DARBEPOETIN ALFA (dar be POE e tin AL fa) helps your body make more red blood cells. It is used to treat anemia caused by chronic kidney failure and chemotherapy. This medicine may be used for other purposes; ask your health care provider or pharmacist if you have questions. What should I tell my health care provider before I take this medicine? They need to know if you have any of these conditions: -blood clotting disorders or history of blood clots -cancer patient not on chemotherapy -cystic fibrosis -heart disease, such as angina, heart failure, or a history of a heart attack -hemoglobin level of 12 g/dL or greater -high blood pressure -low levels of folate, iron, or vitamin B12 -seizures -an unusual or allergic reaction to darbepoetin, erythropoietin, albumin, hamster proteins, latex, other medicines, foods, dyes, or preservatives -pregnant or trying to get pregnant -breast-feeding How should I use this medicine? This medicine is for injection into a vein or under the skin. It is usually given by a health care professional in a hospital or clinic setting. If you get this medicine at home, you will be taught how to prepare and give this medicine. Do not shake the solution before you withdraw a dose. Use exactly as directed. Take your medicine at regular intervals. Do not take your medicine more often than directed. It is important that you put your used needles and syringes in a special sharps container. Do not put them in a trash can. If you do not have a sharps container, call your pharmacist or healthcare provider to get one. Talk to your pediatrician regarding the use of this medicine in children. While this medicine may be used in children as young as 1 year for selected conditions, precautions do apply. Overdosage: If you think you have taken too much of this medicine contact a poison control center or emergency room at once. NOTE:  This medicine is only for you. Do not share this medicine with others. What if I miss a dose? If you miss a dose, take it as soon as you can. If it is almost time for your next dose, take only that dose. Do not take double or extra doses. What may interact with this medicine? Do not take this medicine with any of the following medications: -epoetin alfa This list may not describe all possible interactions. Give your health care provider a list of all the medicines, herbs, non-prescription drugs, or dietary supplements you use. Also tell them if you smoke, drink alcohol, or use illegal drugs. Some items may interact with your medicine. What should I watch for while using this medicine? Visit your prescriber or health care professional for regular checks on your progress and for the needed blood tests and blood pressure measurements. It is especially important for the doctor to make sure your hemoglobin level is in the desired range, to limit the risk of potential side effects and to give you the best benefit. Keep all appointments for any recommended tests. Check your blood pressure as directed. Ask your doctor what your blood pressure should be and when you should contact him or her. As your body makes more red blood cells, you may need to take iron, folic acid, or vitamin B supplements. Ask your doctor or health care provider which products are right for you. If you have kidney disease continue dietary restrictions, even though this medication can make you feel better. Talk with your doctor or health care professional about the   foods you eat and the vitamins that you take. What side effects may I notice from receiving this medicine? Side effects that you should report to your doctor or health care professional as soon as possible: -allergic reactions like skin rash, itching or hives, swelling of the face, lips, or tongue -breathing problems -changes in vision -chest pain -confusion, trouble speaking  or understanding -feeling faint or lightheaded, falls -high blood pressure -muscle aches or pains -pain, swelling, warmth in the leg -rapid weight gain -severe headaches -sudden numbness or weakness of the face, arm or leg -trouble walking, dizziness, loss of balance or coordination -seizures (convulsions) -swelling of the ankles, feet, hands -unusually weak or tired Side effects that usually do not require medical attention (report to your doctor or health care professional if they continue or are bothersome): -diarrhea -fever, chills (flu-like symptoms) -headaches -nausea, vomiting -redness, stinging, or swelling at site where injected This list may not describe all possible side effects. Call your doctor for medical advice about side effects. You may report side effects to FDA at 1-800-FDA-1088. Where should I keep my medicine? Keep out of the reach of children. Store in a refrigerator between 2 and 8 degrees C (36 and 46 degrees F). Do not freeze. Do not shake. Throw away any unused portion if using a single-dose vial. Throw away any unused medicine after the expiration date. NOTE: This sheet is a summary. It may not cover all possible information. If you have questions about this medicine, talk to your doctor, pharmacist, or health care provider.    2016, [employer]. (2008-01-04 10:23:57)  

## 2015-07-06 ENCOUNTER — Ambulatory Visit (HOSPITAL_BASED_OUTPATIENT_CLINIC_OR_DEPARTMENT_OTHER): Payer: Medicaid Other

## 2015-07-06 ENCOUNTER — Encounter: Payer: Self-pay | Admitting: Podiatry

## 2015-07-06 ENCOUNTER — Other Ambulatory Visit: Payer: Self-pay | Admitting: Family Medicine

## 2015-07-06 ENCOUNTER — Ambulatory Visit (INDEPENDENT_AMBULATORY_CARE_PROVIDER_SITE_OTHER): Payer: Medicaid Other | Admitting: Podiatry

## 2015-07-06 VITALS — BP 168/73 | HR 73 | Temp 98.6°F | Resp 20

## 2015-07-06 DIAGNOSIS — B351 Tinea unguium: Secondary | ICD-10-CM

## 2015-07-06 DIAGNOSIS — N189 Chronic kidney disease, unspecified: Secondary | ICD-10-CM

## 2015-07-06 DIAGNOSIS — E1140 Type 2 diabetes mellitus with diabetic neuropathy, unspecified: Secondary | ICD-10-CM | POA: Diagnosis not present

## 2015-07-06 DIAGNOSIS — I739 Peripheral vascular disease, unspecified: Secondary | ICD-10-CM | POA: Diagnosis not present

## 2015-07-06 DIAGNOSIS — I509 Heart failure, unspecified: Secondary | ICD-10-CM | POA: Diagnosis not present

## 2015-07-06 DIAGNOSIS — M79676 Pain in unspecified toe(s): Secondary | ICD-10-CM | POA: Diagnosis not present

## 2015-07-06 DIAGNOSIS — D631 Anemia in chronic kidney disease: Secondary | ICD-10-CM

## 2015-07-06 LAB — KAPPA/LAMBDA LIGHT CHAINS
IG KAPPA FREE LIGHT CHAIN: 96.12 mg/L — AB (ref 3.30–19.40)
IG LAMBDA FREE LIGHT CHAIN: 43.58 mg/L — AB (ref 5.71–26.30)
KAPPA/LAMBDA FLC RATIO: 2.21 — AB (ref 0.26–1.65)

## 2015-07-06 MED ORDER — ACETAMINOPHEN 325 MG PO TABS
650.0000 mg | ORAL_TABLET | Freq: Once | ORAL | Status: AC
  Administered 2015-07-06: 650 mg via ORAL

## 2015-07-06 MED ORDER — ACETAMINOPHEN 325 MG PO TABS
ORAL_TABLET | ORAL | Status: AC
  Filled 2015-07-06: qty 2

## 2015-07-06 MED ORDER — DIPHENHYDRAMINE HCL 25 MG PO CAPS
ORAL_CAPSULE | ORAL | Status: AC
  Filled 2015-07-06: qty 1

## 2015-07-06 MED ORDER — HEPARIN SOD (PORK) LOCK FLUSH 100 UNIT/ML IV SOLN
500.0000 [IU] | Freq: Every day | INTRAVENOUS | Status: DC | PRN
  Filled 2015-07-06: qty 5

## 2015-07-06 MED ORDER — DIPHENHYDRAMINE HCL 25 MG PO CAPS
25.0000 mg | ORAL_CAPSULE | Freq: Once | ORAL | Status: AC
  Administered 2015-07-06: 25 mg via ORAL

## 2015-07-06 MED ORDER — SODIUM CHLORIDE 0.9 % IV SOLN
250.0000 mL | Freq: Once | INTRAVENOUS | Status: AC
  Administered 2015-07-06: 250 mL via INTRAVENOUS

## 2015-07-06 NOTE — Progress Notes (Signed)
Patient ID: Leonard [name], male   DOB: 01/23/1951, 65 y.o.   MRN: [id number] Complaint:  Visit Type: Patient returns to my office for continued preventative foot care services. Complaint: Patient states" my nails have grown long and thick and become painful to walk and wear shoes" Patient has been diagnosed with DM with amputation 4,5 rays right foot... The patient presents for preventative foot care services. No changes to ROS  Podiatric Exam: Vascular: dorsalis pedis and posterior tibial pulses are not  palpable bilateral. Capillary return is immediate.  Cold feet Absence of hair.  Sensorium: Absent  Semmes Weinstein monofilament test. Normal tactile sensation bilaterally. Nail Exam: Pt has thick disfigured discolored nails with subungual debris noted bilateral entire nail hallux through fifth toenails Ulcer Exam: There is no evidence of ulcer or pre-ulcerative changes or infection. Orthopedic Exam: Muscle tone and strength are WNL. No limitations in general ROM. No crepitus or effusions noted. Foot type and digits show no abnormalities. Bony prominences are unremarkable. Skin: Asymptomatic  Porokeratosis left foot.. No infection or ulcers  Diagnosis:  Onychomycosis, , Pain in right toe, pain in left toes  Treatment & Plan Procedures and Treatment: Consent by patient was obtained for treatment procedures. The patient understood the discussion of treatment and procedures well. All questions were answered thoroughly reviewed. Debridement of mycotic and hypertrophic toenails, 1 through 5 bilateral and clearing of subungual debris. No ulceration, no infection noted. Return Visit-Office Procedure: Patient instructed to return to the office for a follow up visit 3 months for continued evaluation and treatment.    Gardiner Barefoot DPM

## 2015-07-06 NOTE — Patient Instructions (Signed)
Blood Transfusion   A blood transfusion is a procedure that gives you donated blood through an IV tube. You may need blood because of illness, surgery, or injury. The blood may come from a donor. The blood may also be your own blood that you donated earlier.  The blood you get is made up of different types of cells. You may get:    Red blood cells. These carry oxygen and replace lost blood.    Platelets. These control bleeding.    Plasma. This helps blood to clot.  If you have a clotting disorder, you may also get other types of blood products.   BEFORE THE PROCEDURE   You may have a blood test. This finds out what type of blood you have. It also finds out what kind of blood your body will accept.    If you are going to have a planned surgery, you may donate your own blood. This is done in case you need to have a transfusion.    If you have had an allergic transfusion reaction before, you may be given medicine to help prevent a reaction. Take this medicine only as told by your doctor.   You will have your temperature, blood pressure, and pulse checked.  PROCEDURE    An IV will be started in your hand or arm.    The bag of donated blood will be attached to your IV and run into your vein.    A doctor will regularly check your temperature, blood pressure, and pulse during the procedure. This is done to find any early signs of a transfusion reaction.   If you have any signs or symptoms of a reaction, the procedure may be stopped and you may be given medicine.    When the transfusion is over, your IV will be removed.    Pressure may be applied to the IV site for a few minutes.    A bandage (dressing) will be applied.   The procedure may vary among doctors and hospitals.   AFTER THE PROCEDURE   Your blood pressure, temperature, and pulse will be checked regularly.     This information is not intended to replace advice given to you by your health care provider. Make sure you discuss any questions  you have with your health care provider.     Document Released: 04/18/2008 Document Revised: 02/10/2014 Document Reviewed: 11/30/2013  Elsevier Interactive Patient Education 2016 [employer]

## 2015-07-09 ENCOUNTER — Encounter: Payer: Self-pay | Admitting: Family Medicine

## 2015-07-09 ENCOUNTER — Ambulatory Visit (INDEPENDENT_AMBULATORY_CARE_PROVIDER_SITE_OTHER): Payer: Medicaid Other | Admitting: Family Medicine

## 2015-07-09 VITALS — BP 144/54 | HR 72 | Temp 98.4°F | Ht 68.0 in | Wt 232.6 lb

## 2015-07-09 DIAGNOSIS — D509 Iron deficiency anemia, unspecified: Secondary | ICD-10-CM

## 2015-07-09 DIAGNOSIS — E038 Other specified hypothyroidism: Secondary | ICD-10-CM | POA: Diagnosis not present

## 2015-07-09 DIAGNOSIS — I5032 Chronic diastolic (congestive) heart failure: Secondary | ICD-10-CM | POA: Diagnosis not present

## 2015-07-09 DIAGNOSIS — R635 Abnormal weight gain: Secondary | ICD-10-CM

## 2015-07-09 DIAGNOSIS — I509 Heart failure, unspecified: Secondary | ICD-10-CM | POA: Diagnosis not present

## 2015-07-09 LAB — TYPE AND SCREEN
ABO/RH(D): B NEG
ANTIBODY SCREEN: NEGATIVE
UNIT DIVISION: 0

## 2015-07-09 LAB — MULTIPLE MYELOMA PANEL, SERUM
ALBUMIN/GLOB SERPL: 1.1 (ref 0.7–1.7)
ALPHA 1: 0.4 g/dL (ref 0.0–0.4)
Albumin SerPl Elph-Mcnc: 3.2 g/dL (ref 2.9–4.4)
Alpha2 Glob SerPl Elph-Mcnc: 0.9 g/dL (ref 0.4–1.0)
B-Globulin SerPl Elph-Mcnc: 1.3 g/dL (ref 0.7–1.3)
GAMMA GLOB SERPL ELPH-MCNC: 0.6 g/dL (ref 0.4–1.8)
GLOBULIN, TOTAL: 3.2 g/dL (ref 2.2–3.9)
IGA/IMMUNOGLOBULIN A, SERUM: 324 mg/dL (ref 61–437)
IGM (IMMUNOGLOBIN M), SRM: 100 mg/dL (ref 20–172)
IgG, Qn, Serum: 589 mg/dL — ABNORMAL LOW (ref 700–1600)
Total Protein: 6.4 g/dL (ref 6.0–8.5)

## 2015-07-09 MED ORDER — FUROSEMIDE 10 MG/ML IJ SOLN
40.0000 mg | Freq: Once | INTRAMUSCULAR | Status: AC
  Administered 2015-07-09: 40 mg via INTRAMUSCULAR

## 2015-07-09 NOTE — Patient Instructions (Signed)
Thank you for coming in,   Please increase your fluid pill by taking three more pills tomorrow of the torsemide and then follow up with me on Thursday.   Please bring all of your medications with you to each visit.   Health maintenance items that are due.  Health Maintenance  Topic Date Due  . Colon Cancer Screening  06/21/2001  . Shingles Vaccine  06/22/2011  . Eye exam for diabetics  02/04/2015  . Flu Shot  09/04/2015  . Complete foot exam   11/21/2015  . Hemoglobin A1C  12/03/2015  . Pneumococcal vaccine (2) 01/05/2018  . Tetanus Vaccine  11/20/2024  .  Hepatitis C: One time screening is recommended by Center for Disease Control  (CDC) for  adults born from [birth year] through 1964.   Completed  . HIV Screening  Completed     Sign up for My Chart to have easy access to your labs results, and communication with your Primary care physician   Please feel free to call with any questions or concerns at any time, at [phone]. --Dr. Raeford Razor

## 2015-07-09 NOTE — Progress Notes (Signed)
   Subjective:    Leonard [name] - 65 y.o. male MRN [id number]  Date of birth: 08-02-1950  CC fatgue  HPI  Leonard [name] is here for fatigue.   He was seen last week for a hospital follow for acute on chronic HF exacerbation. He was fatigued and groggy at that visit and was evaluated in the ED later that day. His lab work returned and found to be around his baseline other than his worsening creatinine.   He had was at his podiatrist on Friday and the cancer center on Friday for a blood transfusion.   Reports his weights at home have been 230 to 232 lbs.  Denies any SOB, orthopnea, or PND.   Reports limited his water intake and salty foods.   He has not been scheduled with GI for his outpatient EGD or colonoscopy. He was found to have heme positive stool and ongoing anemia. He denies any melena or hematochezia.    PMH: HTN, CAD s/p CABG, COPD, diastolic HF grade 2 DD, MGUS, HLD, CKD stage IV, hypothyroidism.  SH: quit smoking in 2015 FH: CAD   Health Maintenance:  Health Maintenance Due  Topic Date Due  . COLONOSCOPY  06/21/2001  . ZOSTAVAX  06/22/2011  . OPHTHALMOLOGY EXAM  02/04/2015    Review of Systems See HPI     Objective:   Physical Exam BP 144/54 mmHg  Pulse 72  Temp(Src) 98.4 F (36.9 C) (Oral)  Ht 5\' 8"  (1.727 m)  Wt 232 lb 9.6 oz (105.507 kg)  BMI 35.38 kg/m2 Gen: NAD, alert, cooperative with exam,  CV: RRR, good S1/S2, no murmur, +1 edema to proximal tibia  Resp: CTABL, no wheezes, non-labored, no crackles  Skin: no rashes, normal turgor  Neuro: no gross deficits.     Assessment & Plan:   Weight gain He has had a 10 lb weight gain since last week.  He was recently admitted for acute on chronic HF exacerbation requiring BiPAP upon admission.  Asymptomatic today  Unclear as to why he would be accumulating fluid so fast with Grade 2 DD.  May be related to medications.  - IM 80 mg lasix in clinic  - advised to double his torsemide dose tomorrow  and follow up with me on Thursday.   Microcytic anemia Called GI and has not been scheduled for f/u.  Denies any acute blood loss.  - referral to GI placed.   Hypothyroidism TSH has been steadily increasing  Started on synthroid on recent admission  - will need to collect TSH and adjust as needed.

## 2015-07-10 ENCOUNTER — Encounter: Payer: Self-pay | Admitting: Licensed Clinical Social Worker

## 2015-07-10 DIAGNOSIS — R635 Abnormal weight gain: Secondary | ICD-10-CM | POA: Insufficient documentation

## 2015-07-10 NOTE — Progress Notes (Signed)
Patient ID: Leonard [name], male   DOB: 1950-04-11, 65 y.o.   MRN: [id number] CSW received completed [employer]) form from MD.   Form faxed [phone] today to [place]. [place] [employer],  [phone] 12:24 PM

## 2015-07-10 NOTE — Assessment & Plan Note (Signed)
He has had a 10 lb weight gain since last week.  He was recently admitted for acute on chronic HF exacerbation requiring BiPAP upon admission.  Asymptomatic today  Unclear as to why he would be accumulating fluid so fast with Grade 2 DD.  May be related to medications.  - IM 80 mg lasix in clinic  - advised to double his torsemide dose tomorrow and follow up with me on Thursday.

## 2015-07-10 NOTE — Assessment & Plan Note (Signed)
Called GI and has not been scheduled for f/u.  Denies any acute blood loss.  - referral to GI placed.

## 2015-07-10 NOTE — Assessment & Plan Note (Signed)
TSH has been steadily increasing  Started on synthroid on recent admission  - will need to collect TSH and adjust as needed.

## 2015-07-13 ENCOUNTER — Ambulatory Visit (INDEPENDENT_AMBULATORY_CARE_PROVIDER_SITE_OTHER): Payer: Medicaid Other | Admitting: Family Medicine

## 2015-07-13 ENCOUNTER — Encounter: Payer: Self-pay | Admitting: Family Medicine

## 2015-07-13 VITALS — BP 142/70 | HR 73 | Temp 98.3°F | Ht 68.0 in | Wt 232.0 lb

## 2015-07-13 DIAGNOSIS — I509 Heart failure, unspecified: Secondary | ICD-10-CM | POA: Diagnosis not present

## 2015-07-13 DIAGNOSIS — R635 Abnormal weight gain: Secondary | ICD-10-CM | POA: Diagnosis not present

## 2015-07-13 NOTE — Progress Notes (Signed)
   Subjective:    Leonard [name] - 65 y.o. male MRN [id number]  Date of birth: 05-05-1950  CC weight gain   HPI  Leonard [name] is here for weight gain.   He was seen on 6/5 for follow up of HF and fatigue. At that time he was noticed to have had a 10 lb weight gain from the previous week. He reports to restricting his fluid intake but does eat salty foods. He was given IM 80 mg lasix while in the clinic and his level of torsemide was doubled the next day and then he was back to his regular dose on Thursday.    Today his edema has improved and having no trouble breathing. He has been taking 120 mg torsemide since his appt on Tuesday.  Denies any orthopnea, PND or edema.   He has his vascular appt for June 30.  His GI doctor is scheduled for June 20.  [facility] is coming out Tuesday for an evaluation.    PMH: HTN, CAD s/p CABG, COPD, diastolic HF grade 2 DD, MGUS, HLD, CKD stage IV, hypothyroidism.  SH: quit smoking in 2015 FH: CAD   Health Maintenance:  Health Maintenance Due  Topic Date Due  . COLONOSCOPY  06/21/2001  . ZOSTAVAX  06/22/2011  . OPHTHALMOLOGY EXAM  02/04/2015    Review of Systems See HPI     Objective:   Physical Exam BP 142/70 mmHg  Pulse 73  Temp(Src) 98.3 F (36.8 C) (Oral)  Ht 5\' 8"  (1.727 m)  Wt 232 lb (105.235 kg)  BMI 35.28 kg/m2  SpO2 98% Gen: NAD, alert, cooperative with exam,  CV: RRR, good S1/S2, no murmur, no edema.  Resp: CTABL, no wheezes, non-labored, no crackles  Skin: no rashes, normal turgor  Neuro: no gross deficits.     Assessment & Plan:   Weight gain Weight continues to be elevated despite diuresis Edema could be contributed by his anemia.  Will return to his baseline of torsemide 60 mg  Will have him see me on a weekly basis in order to keep a check on his weight.

## 2015-07-13 NOTE — Assessment & Plan Note (Signed)
Weight continues to be elevated despite diuresis Edema could be contributed by his anemia.  Will return to his baseline of torsemide 60 mg  Will have him see me on a weekly basis in order to keep a check on his weight.

## 2015-07-13 NOTE — Patient Instructions (Signed)
Thank you for coming in,   Please confirm the appointments for the vascular doctor, GI doctor and kidney doctor.   Please follow up [facility] [phone] or [phone].   Please make an appointment to see me next Friday.   Please call me if there are any questions.   Please return to your regular dose of torsemide.   Please bring all of your medications with you to each visit.   Health maintenance items that are due.  Health Maintenance  Topic Date Due  . Colon Cancer Screening  06/21/2001  . Shingles Vaccine  06/22/2011  . Eye exam for diabetics  02/04/2015  . Flu Shot  09/04/2015  . Complete foot exam   11/21/2015  . Hemoglobin A1C  12/03/2015  . Pneumococcal vaccine (2) 01/05/2018  . Tetanus Vaccine  11/20/2024  .  Hepatitis C: One time screening is recommended by Center for Disease Control  (CDC) for  adults born from [birth year] through 1964.   Completed  . HIV Screening  Completed     Sign up for My Chart to have easy access to your labs results, and communication with your Primary care physician   Please feel free to call with any questions or concerns at any time, at [phone]. --Dr. Raeford Razor  Diet Recommendations for Diabetes   Starchy (carb) foods include: Bread, rice, pasta, potatoes, corn, crackers, bagels, muffins, all baked goods.   Protein foods include: Meat, fish, poultry, eggs, dairy foods, and beans such as pinto and kidney beans (beans also provide carbohydrate).   1. Eat at least 3 meals and 1-2 snacks per day. Never go more than 4-5 hours while awake without eating.  2. Limit starchy foods to TWO per meal and ONE per snack. ONE portion of a starchy  food is equal to the following:   - ONE slice of bread (or its equivalent, such as half of a hamburger bun).   - 1/2 cup of a "scoopable" starchy food such as potatoes or rice.   - 1 OUNCE (28 grams) of starchy snack foods such as crackers or pretzels (look on label).   - 15 grams of carbohydrate as  shown on food label.  3. Both lunch and dinner should include a protein food, a carb food, and vegetables.   - Obtain twice as many veg's as protein or carbohydrate foods for both lunch and dinner.   - Try to keep frozen veg's on hand for a quick vegetable serving.     - Fresh or frozen veg's are best.  4. Breakfast should always include protein.

## 2015-07-16 ENCOUNTER — Ambulatory Visit: Payer: Medicaid Other | Admitting: Family Medicine

## 2015-07-18 ENCOUNTER — Telehealth: Payer: Self-pay | Admitting: Family Medicine

## 2015-07-18 NOTE — Telephone Encounter (Signed)
Leonard [name] from [facility] calls, patient has had increase swelling and weight gain but vitals are stable. I advised her that patient has appt with [place] on 07/20/15. Any questions, Trisha's # is [phone]

## 2015-07-19 ENCOUNTER — Other Ambulatory Visit: Payer: Self-pay | Admitting: Hematology and Oncology

## 2015-07-19 ENCOUNTER — Ambulatory Visit (HOSPITAL_BASED_OUTPATIENT_CLINIC_OR_DEPARTMENT_OTHER): Payer: Medicaid Other

## 2015-07-19 ENCOUNTER — Ambulatory Visit: Payer: Medicaid Other

## 2015-07-19 ENCOUNTER — Other Ambulatory Visit (HOSPITAL_BASED_OUTPATIENT_CLINIC_OR_DEPARTMENT_OTHER): Payer: Medicaid Other

## 2015-07-19 VITALS — BP 174/77 | HR 64 | Temp 97.8°F | Resp 19

## 2015-07-19 VITALS — BP 138/55 | HR 67 | Temp 98.4°F | Resp 22

## 2015-07-19 DIAGNOSIS — N189 Chronic kidney disease, unspecified: Principal | ICD-10-CM

## 2015-07-19 DIAGNOSIS — D631 Anemia in chronic kidney disease: Secondary | ICD-10-CM

## 2015-07-19 LAB — CBC & DIFF AND RETIC
BASO%: 0.1 % (ref 0.0–2.0)
Basophils Absolute: 0 10*3/uL (ref 0.0–0.1)
EOS ABS: 0.2 10*3/uL (ref 0.0–0.5)
EOS%: 3.3 % (ref 0.0–7.0)
HCT: 23.2 % — ABNORMAL LOW (ref 38.4–49.9)
HEMOGLOBIN: 7 g/dL — AB (ref 13.0–17.1)
IMMATURE RETIC FRACT: 10.3 % (ref 3.00–10.60)
LYMPH%: 13 % — AB (ref 14.0–49.0)
MCH: 23.1 pg — AB (ref 27.2–33.4)
MCHC: 30.2 g/dL — ABNORMAL LOW (ref 32.0–36.0)
MCV: 76.6 fL — AB (ref 79.3–98.0)
MONO#: 0.4 10*3/uL (ref 0.1–0.9)
MONO%: 5.7 % (ref 0.0–14.0)
NEUT%: 77.9 % — AB (ref 39.0–75.0)
NEUTROS ABS: 5.5 10*3/uL (ref 1.5–6.5)
PLATELETS: 158 10*3/uL (ref 140–400)
RBC: 3.03 10*6/uL — ABNORMAL LOW (ref 4.20–5.82)
RDW: 19.9 % — ABNORMAL HIGH (ref 11.0–14.6)
Retic %: 1.91 % — ABNORMAL HIGH (ref 0.80–1.80)
Retic Ct Abs: 57.87 10*3/uL (ref 34.80–93.90)
WBC: 7.1 10*3/uL (ref 4.0–10.3)
lymph#: 0.9 10*3/uL (ref 0.9–3.3)

## 2015-07-19 LAB — PREPARE RBC (CROSSMATCH)

## 2015-07-19 MED ORDER — DIPHENHYDRAMINE HCL 25 MG PO CAPS
25.0000 mg | ORAL_CAPSULE | Freq: Once | ORAL | Status: AC
  Administered 2015-07-19: 25 mg via ORAL

## 2015-07-19 MED ORDER — DARBEPOETIN ALFA 500 MCG/ML IJ SOSY
500.0000 ug | PREFILLED_SYRINGE | Freq: Once | INTRAMUSCULAR | Status: AC
  Administered 2015-07-19: 500 ug via SUBCUTANEOUS
  Filled 2015-07-19: qty 1

## 2015-07-19 MED ORDER — ACETAMINOPHEN 325 MG PO TABS
ORAL_TABLET | ORAL | Status: AC
  Filled 2015-07-19: qty 2

## 2015-07-19 MED ORDER — ACETAMINOPHEN 325 MG PO TABS
650.0000 mg | ORAL_TABLET | Freq: Once | ORAL | Status: AC
  Administered 2015-07-19: 650 mg via ORAL

## 2015-07-19 MED ORDER — SODIUM CHLORIDE 0.9 % IV SOLN
250.0000 mL | Freq: Once | INTRAVENOUS | Status: AC
  Administered 2015-07-19: 250 mL via INTRAVENOUS

## 2015-07-19 MED ORDER — DIPHENHYDRAMINE HCL 25 MG PO CAPS
ORAL_CAPSULE | ORAL | Status: AC
  Filled 2015-07-19: qty 2

## 2015-07-19 NOTE — Patient Instructions (Signed)
Darbepoetin Alfa injection What is this medicine? DARBEPOETIN ALFA (dar be POE e tin AL fa) helps your body make more red blood cells. It is used to treat anemia caused by chronic kidney failure and chemotherapy. This medicine may be used for other purposes; ask your health care provider or pharmacist if you have questions. What should I tell my health care provider before I take this medicine? They need to know if you have any of these conditions: -blood clotting disorders or history of blood clots -cancer patient not on chemotherapy -cystic fibrosis -heart disease, such as angina, heart failure, or a history of a heart attack -hemoglobin level of 12 g/dL or greater -high blood pressure -low levels of folate, iron, or vitamin B12 -seizures -an unusual or allergic reaction to darbepoetin, erythropoietin, albumin, hamster proteins, latex, other medicines, foods, dyes, or preservatives -pregnant or trying to get pregnant -breast-feeding How should I use this medicine? This medicine is for injection into a vein or under the skin. It is usually given by a health care professional in a hospital or clinic setting. If you get this medicine at home, you will be taught how to prepare and give this medicine. Do not shake the solution before you withdraw a dose. Use exactly as directed. Take your medicine at regular intervals. Do not take your medicine more often than directed. It is important that you put your used needles and syringes in a special sharps container. Do not put them in a trash can. If you do not have a sharps container, call your pharmacist or healthcare provider to get one. Talk to your pediatrician regarding the use of this medicine in children. While this medicine may be used in children as young as 1 year for selected conditions, precautions do apply. Overdosage: If you think you have taken too much of this medicine contact a poison control center or emergency room at once. NOTE:  This medicine is only for you. Do not share this medicine with others. What if I miss a dose? If you miss a dose, take it as soon as you can. If it is almost time for your next dose, take only that dose. Do not take double or extra doses. What may interact with this medicine? Do not take this medicine with any of the following medications: -epoetin alfa This list may not describe all possible interactions. Give your health care provider a list of all the medicines, herbs, non-prescription drugs, or dietary supplements you use. Also tell them if you smoke, drink alcohol, or use illegal drugs. Some items may interact with your medicine. What should I watch for while using this medicine? Visit your prescriber or health care professional for regular checks on your progress and for the needed blood tests and blood pressure measurements. It is especially important for the doctor to make sure your hemoglobin level is in the desired range, to limit the risk of potential side effects and to give you the best benefit. Keep all appointments for any recommended tests. Check your blood pressure as directed. Ask your doctor what your blood pressure should be and when you should contact him or her. As your body makes more red blood cells, you may need to take iron, folic acid, or vitamin B supplements. Ask your doctor or health care provider which products are right for you. If you have kidney disease continue dietary restrictions, even though this medication can make you feel better. Talk with your doctor or health care professional about the   foods you eat and the vitamins that you take. What side effects may I notice from receiving this medicine? Side effects that you should report to your doctor or health care professional as soon as possible: -allergic reactions like skin rash, itching or hives, swelling of the face, lips, or tongue -breathing problems -changes in vision -chest pain -confusion, trouble speaking  or understanding -feeling faint or lightheaded, falls -high blood pressure -muscle aches or pains -pain, swelling, warmth in the leg -rapid weight gain -severe headaches -sudden numbness or weakness of the face, arm or leg -trouble walking, dizziness, loss of balance or coordination -seizures (convulsions) -swelling of the ankles, feet, hands -unusually weak or tired Side effects that usually do not require medical attention (report to your doctor or health care professional if they continue or are bothersome): -diarrhea -fever, chills (flu-like symptoms) -headaches -nausea, vomiting -redness, stinging, or swelling at site where injected This list may not describe all possible side effects. Call your doctor for medical advice about side effects. You may report side effects to FDA at 1-800-FDA-1088. Where should I keep my medicine? Keep out of the reach of children. Store in a refrigerator between 2 and 8 degrees C (36 and 46 degrees F). Do not freeze. Do not shake. Throw away any unused portion if using a single-dose vial. Throw away any unused medicine after the expiration date. NOTE: This sheet is a summary. It may not cover all possible information. If you have questions about this medicine, talk to your doctor, pharmacist, or health care provider.    2016, [employer]. (2008-01-04 10:23:57)  

## 2015-07-19 NOTE — Patient Instructions (Signed)
Transfusin de sangre, cuidados posteriores (Blood Transfusion, Care After) Estas indicaciones le proporcionan informacin acerca de cmo deber cuidarse despus del procedimiento. El mdico tambin podr darle instrucciones especficas. Comunquese con el mdico si tiene algn problema o tiene preguntas despus del procedimiento.  [place] los medicamentos solamente como se lo haya indicado el mdico. Pregntele al mdico si puede tomar un analgsico de venta libre si tiene fiebre o dolor de [place] despus del procedimiento.  [place] sus actividades habituales como se lo haya indicado el mdico. SOLICITE AYUDA SI:   Presenta enrojecimiento o irritacin en el lugar donde se coloc la va intravenosa.  Tiene fiebre, escalofros o dolor de cabeza que no se [place].  La orina es ms oscura que lo normal.  La orina se vuelve de color:  Rosa.  Rojo.  Marrn.  La parte blanca de los ojos se vuelve amarilla (ictericia).  Se siente dbil despus de realizar sus actividades habituales. SOLICITE AYUDA DE INMEDIATO SI:   Tiene dificultad para respirar.  Tiene fiebre y escalofros, y tambin siente los siguientes sntomas:  Ansiedad.  Dolor de pecho o de espalda.  Piel rosada o irritada.  Halliday.  Latidos cardacos acelerados.  Ganas de vomitar (nuseas).   Esta informacin no tiene Marine scientist el consejo del mdico. Asegrese de hacerle al mdico cualquier pregunta que tenga.   Document Released: 02/10/2014 Elsevier Interactive Patient Education [employer].

## 2015-07-20 ENCOUNTER — Encounter: Payer: Self-pay | Admitting: Family Medicine

## 2015-07-20 ENCOUNTER — Other Ambulatory Visit: Payer: Self-pay | Admitting: *Deleted

## 2015-07-20 ENCOUNTER — Ambulatory Visit (INDEPENDENT_AMBULATORY_CARE_PROVIDER_SITE_OTHER): Payer: Medicaid Other | Admitting: Family Medicine

## 2015-07-20 VITALS — BP 148/56 | HR 66 | Temp 98.5°F | Ht 68.0 in | Wt 238.6 lb

## 2015-07-20 DIAGNOSIS — R0989 Other specified symptoms and signs involving the circulatory and respiratory systems: Secondary | ICD-10-CM

## 2015-07-20 DIAGNOSIS — I509 Heart failure, unspecified: Secondary | ICD-10-CM | POA: Diagnosis not present

## 2015-07-20 DIAGNOSIS — R635 Abnormal weight gain: Secondary | ICD-10-CM | POA: Diagnosis not present

## 2015-07-20 DIAGNOSIS — I5032 Chronic diastolic (congestive) heart failure: Secondary | ICD-10-CM | POA: Diagnosis not present

## 2015-07-20 LAB — BASIC METABOLIC PANEL WITH GFR
BUN: 37 mg/dL — AB (ref 7–25)
CO2: 23 mmol/L (ref 20–31)
CREATININE: 2.46 mg/dL — AB (ref 0.70–1.25)
Calcium: 8.9 mg/dL (ref 8.6–10.3)
Chloride: 107 mmol/L (ref 98–110)
GFR, EST AFRICAN AMERICAN: 31 mL/min — AB (ref >=60)
GFR, Est Non African American: 27 mL/min — ABNORMAL LOW (ref >=60)
Glucose, Bld: 150 mg/dL — ABNORMAL HIGH (ref 65–99)
Potassium: 4.1 mmol/L (ref 3.5–5.3)
Sodium: 140 mmol/L (ref 135–146)

## 2015-07-20 LAB — TYPE AND SCREEN
ABO/RH(D): B NEG
Antibody Screen: NEGATIVE
Unit division: 0
Unit division: 0

## 2015-07-20 MED ORDER — TORSEMIDE 20 MG PO TABS: 60.0000 mg | ORAL_TABLET | Freq: Every day | ORAL | Status: DC

## 2015-07-20 NOTE — Assessment & Plan Note (Signed)
Weight gain could be associated with 2 U of blood given yesterday  He denies any trouble breathing.  He PO intake seems to be contributing to his weight gain as well.  Anemia may be playing a part with trying to get the fluid off.  - bmp today. His creatinine has been increasing.  - increased torsemide to 120 mg for the next three days.  - he will call Monday with his weight.  - if you continues to rise may need to consider a short stay to receive IV diuretics

## 2015-07-20 NOTE — Patient Instructions (Signed)
Thank you for coming in,   I will call or send a letter with the results from today.   Please double your torsemide for the next three days. Take three pills in the morning and three pills at noon.   Please make sure that your feet are above your heart when you are lying down.   Please call me on Monday with your weight. You HAVE TO CALL ME.   Please follow up with me in one week.   Please bring all of your medications with you to each visit.   Health maintenance items that are due.  Health Maintenance  Topic Date Due  . Colon Cancer Screening  06/21/2001  . Shingles Vaccine  06/22/2011  . Eye exam for diabetics  02/04/2015  . Flu Shot  09/04/2015  . Complete foot exam   11/21/2015  . Hemoglobin A1C  12/03/2015  . Pneumococcal vaccine (2) 01/05/2018  . Tetanus Vaccine  11/20/2024  .  Hepatitis C: One time screening is recommended by Center for Disease Control  (CDC) for  adults born from [birth year] through 1964.   Completed  . HIV Screening  Completed     Sign up for My Chart to have easy access to your labs results, and communication with your Primary care physician   Please feel free to call with any questions or concerns at any time, at [phone]. --Dr. Raeford Razor

## 2015-07-20 NOTE — Progress Notes (Signed)
   Subjective:    Leonard [name] - 65 y.o. male MRN [id number]  Date of birth: [birth year]  CC weight gain   HPI  Leonard [name] is here for weight gain.   He was seen on 6/5 for follow up of HF and fatigue. At that time he was noticed to have had a 10 lb weight gain from the previous week. He reported to restricting his fluid intake but does eat salty foods. He was given IM 80 mg lasix while in the clinic and his level of torsemide was doubled until follow up 6/9.   At the appointment on 6/8 he weight 232 lbs even though he had doubled his torsemide.   Today his weight continues to rise. He denies any SOB, orthopnea, or PND. He reports compliance with his medications. He received two units of blood at the cancer center yesterday. His Hgb was measured at 7.0 yesterday.   24-hr recalll:  (Up at  7 AM) B ( AM)- none  Snk ( AM)- none  L ( 12:30 PM)- soup and ham sandwich at cancer center   Snk ( PM)-   D ( PM)- grilled fish and rice.  Snk ( PM)- ate some sugar free waffers three sticks    Typical day? No. Drank water throughout the day.   He has his vascular appt for June 30.  His GI doctor is scheduled for June 20.  [facility] evaluated him.  The nurse has been filling his pill box for him.    PMH: HTN, CAD s/p CABG, COPD, diastolic HF grade 2 DD, MGUS, HLD, CKD stage IV, hypothyroidism.  SH: quit smoking in 2015 FH: CAD   Health Maintenance:  Health Maintenance Due  Topic Date Due  . COLONOSCOPY  06/21/2001  . ZOSTAVAX  06/22/2011  . OPHTHALMOLOGY EXAM  02/04/2015    Review of Systems See HPI     Objective:   Physical Exam BP 148/56 mmHg  Pulse 66  Temp(Src) 98.5 F (36.9 C) (Oral)  Ht 5\' 8"  (1.727 m)  Wt 238 lb 9.6 oz (108.228 kg)  BMI 36.29 kg/m2 Gen: NAD, alert, cooperative with exam,  CV: RRR, good S1/S2, no murmur, +2 edema bilaterally to mid tibia   Resp: CTABL, no wheezes, non-labored, no crackles  Skin: no rashes, normal  turgor  Neuro: no gross deficits.     Assessment & Plan:   Weight gain Weight gain could be associated with 2 U of blood given yesterday  He denies any trouble breathing.  He PO intake seems to be contributing to his weight gain as well.  Anemia may be playing a part with trying to get the fluid off.  - bmp today. His creatinine has been increasing.  - increased torsemide to 120 mg for the next three days.  - he will call Monday with his weight.  - if you continues to rise may need to consider a short stay to receive IV diuretics

## 2015-07-23 ENCOUNTER — Telehealth: Payer: Self-pay | Admitting: *Deleted

## 2015-07-23 NOTE — Telephone Encounter (Signed)
Patient called to give pcp an update on his weight this morning.  It was 238lbs.  Jazmin Hartsell,CMA

## 2015-07-23 NOTE — Telephone Encounter (Signed)
Discussed his weight this morning. He reports to losing two pounds since Friday. Will return to original dose of torsemide until his appt on Friday.   Rosemarie Ax, MD PGY-3, [facility] 07/23/2015, 10:58 AM

## 2015-07-24 ENCOUNTER — Telehealth: Payer: Self-pay | Admitting: *Deleted

## 2015-07-24 NOTE — Telephone Encounter (Signed)
Patient did not keep appointment with [facility] today.  Derl Barrow, RN

## 2015-07-26 ENCOUNTER — Ambulatory Visit (INDEPENDENT_AMBULATORY_CARE_PROVIDER_SITE_OTHER): Payer: Medicaid Other | Admitting: Family Medicine

## 2015-07-26 ENCOUNTER — Encounter: Payer: Self-pay | Admitting: Family Medicine

## 2015-07-26 VITALS — BP 162/62 | HR 66 | Temp 98.2°F | Ht 68.0 in | Wt 232.8 lb

## 2015-07-26 DIAGNOSIS — R635 Abnormal weight gain: Secondary | ICD-10-CM

## 2015-07-26 DIAGNOSIS — I509 Heart failure, unspecified: Secondary | ICD-10-CM | POA: Diagnosis not present

## 2015-07-26 NOTE — Progress Notes (Signed)
   Subjective:    Leonard [name] - 65 y.o. male MRN [id number]  Date of birth: [birth year]  CC weight gain   HPI  Leonard [name] is here for weight gain.   He was seen on 6/5 for follow up of HF and fatigue. At that time he was noticed to have had a 10 lb weight gain from the previous week. He reported to restricting his fluid intake but does eat salty foods. He was given IM 80 mg lasix while in the clinic and his level of torsemide was doubled until follow up 6/9.   At the appointment on 6/8 he weight 232 lbs even though he had doubled his torsemide.   At the appointment on 6/16 he weight was 238 lbs. His torsemide was doubled for three days.  He called on 6/20 and reported his weight to down two pounds so he returned to his normal torsemide dose.    Today his weight has decreased since last visit. He denies any SOB, orthopnea, or PND. He reports compliance with his medications.   He has his vascular appt for June 30.     PMH: HTN, CAD s/p CABG, COPD, diastolic HF grade 2 DD, MGUS, HLD, CKD stage IV, hypothyroidism.  SH: quit smoking in 2015 FH: CAD   Health Maintenance:  Health Maintenance Due  Topic Date Due  . COLONOSCOPY  06/21/2001  . ZOSTAVAX  06/22/2011  . OPHTHALMOLOGY EXAM  02/04/2015    Review of Systems See HPI     Objective:   Physical Exam BP 162/62 mmHg  Pulse 66  Temp(Src) 98.2 F (36.8 C) (Oral)  Ht 5\' 8"  (1.727 m)  Wt 232 lb 12.8 oz (105.597 kg)  BMI 35.41 kg/m2 Gen: NAD, alert, cooperative with exam,  CV: RRR, good S1/S2, no murmur, +1 edema bilaterally to just above his ankles.   Resp: CTABL, no wheezes, non-labored, no crackles  Skin: no rashes, normal turgor  Neuro: no gross deficits.     Assessment & Plan:   Weight gain Has had improvement with his weight but still not at goal of 220 lbs  Will continue close follow up at this point to stay on top of fluid status.  Continue current dose of torsemide.  Follow up in one week.

## 2015-07-26 NOTE — Assessment & Plan Note (Signed)
Has had improvement with his weight but still not at goal of 220 lbs  Will continue close follow up at this point to stay on top of fluid status.  Continue current dose of torsemide.  Follow up in one week.

## 2015-07-26 NOTE — Patient Instructions (Signed)
Thank you for coming in,   Please follow up with me next week.   Please bring all of your medications with you to each visit.   Health maintenance items that are due.  Health Maintenance  Topic Date Due  . Colon Cancer Screening  06/21/2001  . Shingles Vaccine  06/22/2011  . Eye exam for diabetics  02/04/2015  . Flu Shot  09/04/2015  . Complete foot exam   11/21/2015  . Hemoglobin A1C  12/03/2015  . Tetanus Vaccine  11/20/2024  .  Hepatitis C: One time screening is recommended by Center for Disease Control  (CDC) for  adults born from [birth year] through 1964.   Completed  . HIV Screening  Completed     Sign up for My Chart to have easy access to your labs results, and communication with your Primary care physician   Please feel free to call with any questions or concerns at any time, at [phone]. --Dr. Raeford Razor

## 2015-07-27 ENCOUNTER — Encounter: Payer: Self-pay | Admitting: Vascular Surgery

## 2015-07-31 ENCOUNTER — Other Ambulatory Visit: Payer: Self-pay | Admitting: Internal Medicine

## 2015-08-02 ENCOUNTER — Telehealth: Payer: Self-pay | Admitting: *Deleted

## 2015-08-02 ENCOUNTER — Ambulatory Visit: Payer: Medicaid Other

## 2015-08-02 ENCOUNTER — Other Ambulatory Visit (HOSPITAL_BASED_OUTPATIENT_CLINIC_OR_DEPARTMENT_OTHER): Payer: Medicaid Other

## 2015-08-02 ENCOUNTER — Telehealth: Payer: Self-pay | Admitting: Hematology and Oncology

## 2015-08-02 ENCOUNTER — Ambulatory Visit (INDEPENDENT_AMBULATORY_CARE_PROVIDER_SITE_OTHER): Payer: Medicaid Other | Admitting: Family Medicine

## 2015-08-02 ENCOUNTER — Encounter: Payer: Self-pay | Admitting: Family Medicine

## 2015-08-02 ENCOUNTER — Ambulatory Visit (HOSPITAL_BASED_OUTPATIENT_CLINIC_OR_DEPARTMENT_OTHER): Payer: Medicaid Other | Admitting: Hematology and Oncology

## 2015-08-02 ENCOUNTER — Ambulatory Visit (HOSPITAL_BASED_OUTPATIENT_CLINIC_OR_DEPARTMENT_OTHER): Payer: Medicaid Other

## 2015-08-02 VITALS — BP 172/64 | HR 73 | Temp 98.2°F | Ht 68.0 in | Wt 243.2 lb

## 2015-08-02 VITALS — BP 163/62 | HR 75 | Temp 98.3°F | Resp 18 | Ht 68.0 in | Wt 242.7 lb

## 2015-08-02 DIAGNOSIS — I10 Essential (primary) hypertension: Secondary | ICD-10-CM

## 2015-08-02 DIAGNOSIS — D631 Anemia in chronic kidney disease: Secondary | ICD-10-CM

## 2015-08-02 DIAGNOSIS — N189 Chronic kidney disease, unspecified: Secondary | ICD-10-CM

## 2015-08-02 DIAGNOSIS — R635 Abnormal weight gain: Secondary | ICD-10-CM

## 2015-08-02 DIAGNOSIS — N184 Chronic kidney disease, stage 4 (severe): Secondary | ICD-10-CM | POA: Diagnosis not present

## 2015-08-02 DIAGNOSIS — I5032 Chronic diastolic (congestive) heart failure: Secondary | ICD-10-CM | POA: Diagnosis not present

## 2015-08-02 DIAGNOSIS — D472 Monoclonal gammopathy: Secondary | ICD-10-CM

## 2015-08-02 LAB — CBC & DIFF AND RETIC
BASO%: 0.2 % (ref 0.0–2.0)
BASOS ABS: 0 10*3/uL (ref 0.0–0.1)
EOS ABS: 0.3 10*3/uL (ref 0.0–0.5)
EOS%: 2.9 % (ref 0.0–7.0)
HCT: 28.6 % — ABNORMAL LOW (ref 38.4–49.9)
HEMOGLOBIN: 8.8 g/dL — AB (ref 13.0–17.1)
IMMATURE RETIC FRACT: 7.2 % (ref 3.00–10.60)
LYMPH%: 9.7 % — AB (ref 14.0–49.0)
MCH: 23.2 pg — ABNORMAL LOW (ref 27.2–33.4)
MCHC: 30.8 g/dL — ABNORMAL LOW (ref 32.0–36.0)
MCV: 75.3 fL — AB (ref 79.3–98.0)
MONO#: 0.6 10*3/uL (ref 0.1–0.9)
MONO%: 5.7 % (ref 0.0–14.0)
NEUT#: 8.9 10*3/uL — ABNORMAL HIGH (ref 1.5–6.5)
NEUT%: 81.5 % — ABNORMAL HIGH (ref 39.0–75.0)
PLATELETS: 185 10*3/uL (ref 140–400)
RBC: 3.8 10*6/uL — ABNORMAL LOW (ref 4.20–5.82)
RDW: 18.9 % — ABNORMAL HIGH (ref 11.0–14.6)
Retic %: 1.78 % (ref 0.80–1.80)
Retic Ct Abs: 67.64 10*3/uL (ref 34.80–93.90)
WBC: 10.9 10*3/uL — ABNORMAL HIGH (ref 4.0–10.3)
lymph#: 1.1 10*3/uL (ref 0.9–3.3)
nRBC: 0 % (ref 0–0)

## 2015-08-02 LAB — IRON AND TIBC
%SAT: 9 % — AB (ref 20–55)
Iron: 32 ug/dL — ABNORMAL LOW (ref 42–163)
TIBC: 344 ug/dL (ref 202–409)
UIBC: 312 ug/dL (ref 117–376)

## 2015-08-02 LAB — FERRITIN: FERRITIN: 127 ng/mL (ref 22–316)

## 2015-08-02 MED ORDER — DARBEPOETIN ALFA 500 MCG/ML IJ SOSY
500.0000 ug | PREFILLED_SYRINGE | Freq: Once | INTRAMUSCULAR | Status: AC
  Administered 2015-08-02: 500 ug via SUBCUTANEOUS
  Filled 2015-08-02: qty 1

## 2015-08-02 MED ORDER — TORSEMIDE 20 MG PO TABS: 60.0000 mg | ORAL_TABLET | Freq: Every day | ORAL | Status: DC

## 2015-08-02 MED ORDER — ISOSORBIDE MONONITRATE ER 60 MG PO TB24: ORAL_TABLET | ORAL | Status: DC

## 2015-08-02 NOTE — Progress Notes (Signed)
   Subjective:    Leonard [name] - 65 y.o. male MRN [id number]  Date of birth: 12-05-1950  CC weight gain   HPI  Leonard [name] is here for weight gain.   He was seen on 6/5 for follow up of HF and fatigue. At that time he was noticed to have had a 10 lb weight gain from the previous week. He reported to restricting his fluid intake but does eat salty foods. He was given IM 80 mg lasix while in the clinic and his level of torsemide was doubled until follow up 6/9.   At the appointment on 6/8 he weight 232 lbs even though he had doubled his torsemide.   At the appointment on 6/16 he weight was 238 lbs. His torsemide was doubled for three days.  He called on 6/20 and reported his weight to down two pounds so he returned to his normal torsemide dose.    Today his weight has increased since last visit. He denies any SOB, orthopnea, or PND. He reports compliance with his medications. Reports he has a nurse coming out to the house to help with his pill box.   He has his vascular appt for June 30.     PMH: HTN, CAD s/p CABG, COPD, diastolic HF grade 2 DD, MGUS, HLD, CKD stage IV, hypothyroidism.  SH: quit smoking in 2015 FH: CAD   Health Maintenance:  Health Maintenance Due  Topic Date Due  . COLONOSCOPY  06/21/2001  . ZOSTAVAX  06/22/2011  . OPHTHALMOLOGY EXAM  02/04/2015    Review of Systems See HPI     Objective:   Physical Exam BP 172/64 mmHg  Pulse 73  Temp(Src) 98.2 F (36.8 C) (Oral)  Ht 5\' 8"  (1.727 m)  Wt 243 lb 3.2 oz (110.315 kg)  BMI 36.99 kg/m2 Gen: NAD, alert, cooperative with exam,  CV: RRR, good S1/S2, no murmur, +2 edema bilaterally to mid tibia    Resp: CTABL, no wheezes, non-labored, no crackles  Skin: no rashes, normal turgor  Neuro: no gross deficits.     Assessment & Plan:   Weight gain Has had a 10 lb weight gain in the past week  Reports his diet may be contributing  Compliant with medications.  - advised to double dose of torsemide in the  next three days  - advised to follow up in one week  - encouraged to make an appointment with nephrology and cardiology  - may need BMP at next visit.

## 2015-08-02 NOTE — Patient Instructions (Addendum)
Thank you for coming in,   Double your dose of torsemide the next three days then stop.   Please follow up in one week.   If you get a transfusion then please double your dose of torsemide   GI: Dr. Michail Sermon  Vascular: new doctor  Nephrology: Please make an appointment to see  Cardiology: Please make an appointment to see  Oncology    Please bring all of your medications with you to each visit.   Health maintenance items that are due.  Health Maintenance  Topic Date Due  . Colon Cancer Screening  06/21/2001  . Shingles Vaccine  06/22/2011  . Eye exam for diabetics  02/04/2015  . Flu Shot  09/04/2015  . Complete foot exam   11/21/2015  . Hemoglobin A1C  12/03/2015  . Tetanus Vaccine  11/20/2024  .  Hepatitis C: One time screening is recommended by Center for Disease Control  (CDC) for  adults born from [birth year] through 1964.   Completed  . HIV Screening  Completed     Sign up for My Chart to have easy access to your labs results, and communication with your Primary care physician   Please feel free to call with any questions or concerns at any time, at [phone]. --Dr. Raeford Razor

## 2015-08-02 NOTE — Telephone Encounter (Signed)
Gave and printed appt sched and avs for pt for June thru Sept °

## 2015-08-02 NOTE — Telephone Encounter (Signed)
Per staff message and POF I have scheduled appts. Advised scheduler of appts. JMW  

## 2015-08-02 NOTE — Patient Instructions (Signed)
Darbepoetin Alfa injection What is this medicine? DARBEPOETIN ALFA (dar be POE e tin AL fa) helps your body make more red blood cells. It is used to treat anemia caused by chronic kidney failure and chemotherapy. This medicine may be used for other purposes; ask your health care provider or pharmacist if you have questions. What should I tell my health care provider before I take this medicine? They need to know if you have any of these conditions: -blood clotting disorders or history of blood clots -cancer patient not on chemotherapy -cystic fibrosis -heart disease, such as angina, heart failure, or a history of a heart attack -hemoglobin level of 12 g/dL or greater -high blood pressure -low levels of folate, iron, or vitamin B12 -seizures -an unusual or allergic reaction to darbepoetin, erythropoietin, albumin, hamster proteins, latex, other medicines, foods, dyes, or preservatives -pregnant or trying to get pregnant -breast-feeding How should I use this medicine? This medicine is for injection into a vein or under the skin. It is usually given by a health care professional in a hospital or clinic setting. If you get this medicine at home, you will be taught how to prepare and give this medicine. Do not shake the solution before you withdraw a dose. Use exactly as directed. Take your medicine at regular intervals. Do not take your medicine more often than directed. It is important that you put your used needles and syringes in a special sharps container. Do not put them in a trash can. If you do not have a sharps container, call your pharmacist or healthcare provider to get one. Talk to your pediatrician regarding the use of this medicine in children. While this medicine may be used in children as young as 1 year for selected conditions, precautions do apply. Overdosage: If you think you have taken too much of this medicine contact a poison control center or emergency room at once. NOTE:  This medicine is only for you. Do not share this medicine with others. What if I miss a dose? If you miss a dose, take it as soon as you can. If it is almost time for your next dose, take only that dose. Do not take double or extra doses. What may interact with this medicine? Do not take this medicine with any of the following medications: -epoetin alfa This list may not describe all possible interactions. Give your health care provider a list of all the medicines, herbs, non-prescription drugs, or dietary supplements you use. Also tell them if you smoke, drink alcohol, or use illegal drugs. Some items may interact with your medicine. What should I watch for while using this medicine? Visit your prescriber or health care professional for regular checks on your progress and for the needed blood tests and blood pressure measurements. It is especially important for the doctor to make sure your hemoglobin level is in the desired range, to limit the risk of potential side effects and to give you the best benefit. Keep all appointments for any recommended tests. Check your blood pressure as directed. Ask your doctor what your blood pressure should be and when you should contact him or her. As your body makes more red blood cells, you may need to take iron, folic acid, or vitamin B supplements. Ask your doctor or health care provider which products are right for you. If you have kidney disease continue dietary restrictions, even though this medication can make you feel better. Talk with your doctor or health care professional about the   foods you eat and the vitamins that you take. What side effects may I notice from receiving this medicine? Side effects that you should report to your doctor or health care professional as soon as possible: -allergic reactions like skin rash, itching or hives, swelling of the face, lips, or tongue -breathing problems -changes in vision -chest pain -confusion, trouble speaking  or understanding -feeling faint or lightheaded, falls -high blood pressure -muscle aches or pains -pain, swelling, warmth in the leg -rapid weight gain -severe headaches -sudden numbness or weakness of the face, arm or leg -trouble walking, dizziness, loss of balance or coordination -seizures (convulsions) -swelling of the ankles, feet, hands -unusually weak or tired Side effects that usually do not require medical attention (report to your doctor or health care professional if they continue or are bothersome): -diarrhea -fever, chills (flu-like symptoms) -headaches -nausea, vomiting -redness, stinging, or swelling at site where injected This list may not describe all possible side effects. Call your doctor for medical advice about side effects. You may report side effects to FDA at 1-800-FDA-1088. Where should I keep my medicine? Keep out of the reach of children. Store in a refrigerator between 2 and 8 degrees C (36 and 46 degrees F). Do not freeze. Do not shake. Throw away any unused portion if using a single-dose vial. Throw away any unused medicine after the expiration date. NOTE: This sheet is a summary. It may not cover all possible information. If you have questions about this medicine, talk to your doctor, pharmacist, or health care provider.    2016, [employer]. (2008-01-04 10:23:57)  

## 2015-08-02 NOTE — Assessment & Plan Note (Signed)
Has had a 10 lb weight gain in the past week  Reports his diet may be contributing  Compliant with medications.  - advised to double dose of torsemide in the next three days  - advised to follow up in one week  - encouraged to make an appointment with nephrology and cardiology  - may need BMP at next visit.

## 2015-08-03 ENCOUNTER — Encounter: Payer: Self-pay | Admitting: Hematology and Oncology

## 2015-08-03 ENCOUNTER — Telehealth: Payer: Self-pay | Admitting: *Deleted

## 2015-08-03 ENCOUNTER — Ambulatory Visit (INDEPENDENT_AMBULATORY_CARE_PROVIDER_SITE_OTHER): Payer: Medicaid Other | Admitting: Vascular Surgery

## 2015-08-03 ENCOUNTER — Encounter: Payer: Self-pay | Admitting: Vascular Surgery

## 2015-08-03 ENCOUNTER — Other Ambulatory Visit: Payer: Self-pay | Admitting: Hematology and Oncology

## 2015-08-03 ENCOUNTER — Ambulatory Visit (HOSPITAL_COMMUNITY)
Admission: RE | Admit: 2015-08-03 | Discharge: 2015-08-03 | Disposition: A | Discharge status: Home / Self Care | Payer: Medicaid Other | Source: Ambulatory Visit | Attending: Vascular Surgery | Admitting: Vascular Surgery

## 2015-08-03 VITALS — BP 152/66 | HR 70 | Temp 98.8°F | Resp 20 | Ht 68.0 in | Wt 242.0 lb

## 2015-08-03 DIAGNOSIS — I70213 Atherosclerosis of native arteries of extremities with intermittent claudication, bilateral legs: Secondary | ICD-10-CM

## 2015-08-03 DIAGNOSIS — D509 Iron deficiency anemia, unspecified: Secondary | ICD-10-CM | POA: Insufficient documentation

## 2015-08-03 DIAGNOSIS — R0989 Other specified symptoms and signs involving the circulatory and respiratory systems: Secondary | ICD-10-CM

## 2015-08-03 DIAGNOSIS — I110 Hypertensive heart disease with heart failure: Secondary | ICD-10-CM | POA: Diagnosis not present

## 2015-08-03 DIAGNOSIS — I509 Heart failure, unspecified: Secondary | ICD-10-CM | POA: Diagnosis not present

## 2015-08-03 DIAGNOSIS — E119 Type 2 diabetes mellitus without complications: Secondary | ICD-10-CM | POA: Diagnosis not present

## 2015-08-03 DIAGNOSIS — R600 Localized edema: Secondary | ICD-10-CM | POA: Diagnosis not present

## 2015-08-03 DIAGNOSIS — E7800 Pure hypercholesterolemia, unspecified: Secondary | ICD-10-CM | POA: Diagnosis not present

## 2015-08-03 DIAGNOSIS — I70219 Atherosclerosis of native arteries of extremities with intermittent claudication, unspecified extremity: Secondary | ICD-10-CM | POA: Insufficient documentation

## 2015-08-03 HISTORY — DX: Iron deficiency anemia, unspecified: D50.9

## 2015-08-03 LAB — VAS US LOWER EXTREMITY ARTERIAL DUPLEX
LATIBDISTSYS: -47 cm/s
LEFT PERO DIST SYS: -41 cm/s
LPOPDPSV: -72 cm/s
LSFDPSV: -117 cm/s
LSFMPSV: -145 cm/s
Left popliteal prox sys PSV: 99 cm/s
RATIBDISTSYS: -30 cm/s
RSFDPSV: -42 cm/s
RSFPPSV: -135 cm/s
Right peroneal sys PSV: -24 cm/s
Right popliteal prox sys PSV: -41 cm/s
left post tibial dist sys: -58 cm/s

## 2015-08-03 NOTE — Progress Notes (Signed)
[name] OFFICE PROGRESS NOTE  Patient Care Team: Rosemarie Ax, MD as PCP - General [name], MD as Consulting Physician (Nephrology)  SUMMARY OF ONCOLOGIC HISTORY:  Leonard [name] is here because of progressive anemia, renal failure and MGUS. I reviewed reports from his nephrologist office. The patient have significant peripheral vascular disease, diabetes and hypertension. Early this year, he had baseline creatinine of around 1.3 and subsequently developed heart failure. He was placed on aggressive medication/regimen and subsequently was noted to have progressive renal failure and anemia. According to his blood work dated 01/03/2015, white blood cell count is at 6.6, hemoglobin 7.1, platelet count of 153, creatinine of 2.4, calcium of 8.9, MCV 76, iron saturation 15%, ferritin 180, serum vitamin B-12 of 360. Serum protein electrophoresis initially detected M spike but subsequently not quantifiable. Urine immunofixation showed Bence-Jones Proteinuria. Echocardiogram show preserved ejection fraction 55-60% but with evidence of diastolic heart failure. He denies history of abnormal bone pain or bone fracture. Patient denies history of recurrent infection or atypical infections such as shingles of meningitis. Denies chills, night sweats, anorexia or abnormal weight loss. He complained of fatigue and shortness of breath on minimal exertion. The patient denies any recent signs or symptoms of bleeding such as spontaneous hematuria or hematochezia. He has occasional epistaxis when he blows his nose. He does not have spontaneous epistaxis Evaluation in December 2016 show he had anemia chronic disease and MGUS  On 02/01/2015, he is started on Aranesp, 200 g every 2 weeks to keep hemoglobin greater than 11 Between December to June 2017, the dose of Aranesp is titrated to maximum 500 g  INTERVAL HISTORY: Please see below for problem oriented charting. He continues  to complain of fatigue. Denies chest pain or shortness of breath. The patient denies any recent signs or symptoms of bleeding such as spontaneous epistaxis, hematuria or hematochezia. No recent infection. Denies bone pain.  REVIEW OF SYSTEMS:   Constitutional: Denies fevers, chills or abnormal weight loss Eyes: Denies blurriness of vision Ears, nose, mouth, throat, and face: Denies mucositis or sore throat Respiratory: Denies cough, dyspnea or wheezes Cardiovascular: Denies palpitation, chest discomfort or lower extremity swelling Gastrointestinal:  Denies nausea, heartburn or change in bowel habits Skin: Denies abnormal skin rashes Lymphatics: Denies new lymphadenopathy or easy bruising Neurological:Denies numbness, tingling or new weaknesses Behavioral/Psych: Mood is stable, no new changes  All other systems were reviewed with the patient and are negative.  I have reviewed the past medical history, past surgical history, social history and family history with the patient and they are unchanged from previous note.  ALLERGIES:  has No Known Allergies.  MEDICATIONS:  Current Outpatient Prescriptions  Medication Sig Dispense Refill  . acetaminophen (TYLENOL) 500 MG tablet Take 1 tablet (500 mg total) by mouth every 6 (six) hours as needed for mild pain or moderate pain. 30 tablet 0  . ADVAIR DISKUS 250-50 MCG/DOSE AEPB INHALE 1 PUFF INTO THE LUNGS 2 TIMES DAILY 60 each 1  . albuterol (PROVENTIL HFA;VENTOLIN HFA) 108 (90 Base) MCG/ACT inhaler Inhale 2 puffs into the lungs every 6 (six) hours as needed for wheezing or shortness of breath. 1 Inhaler 2  . amLODipine (NORVASC) 10 MG tablet Take 1 tablet (10 mg total) by mouth daily. 90 tablet 1  . aspirin EC 81 MG EC tablet Take 1 tablet (81 mg total) by mouth daily. 30 tablet 0  . atorvastatin (LIPITOR) 40 MG tablet TAKE 1 TABLET BY MOUTH EVERY DAY AT  6 PM 90 tablet 1  . carvedilol (COREG) 25 MG tablet Take 2 tablets (50 mg total) by mouth 2  (two) times daily with a meal. 180 tablet 3  . doxazosin (CARDURA) 2 MG tablet Take 1 tablet (2 mg total) by mouth every 12 (twelve) hours. 60 tablet 0  . hydrALAZINE (APRESOLINE) 100 MG tablet TAKE 1 TABLET (100MG) BY MOUTH EVERY 8 HOURS 90 tablet 1  . insulin aspart (NOVOLOG FLEXPEN) 100 UNIT/ML FlexPen Inject 35 Units into the skin 3 (three) times daily with meals. 15 mL 6  . Insulin Glargine (LANTUS SOLOSTAR) 100 UNIT/ML Solostar Pen INJECT 65 UNITS INTO THE SKIN 2 TIMES DAILY 15 mL 2  . isosorbide mononitrate (IMDUR) 60 MG 24 hr tablet TAKE ONE (1) TABLET BY MOUTH EVERY DAY 30 tablet 3  . levothyroxine (SYNTHROID, LEVOTHROID) 50 MCG tablet Take 1 tablet (50 mcg total) by mouth daily before breakfast. 30 tablet 2  . nitroGLYCERIN (NITROSTAT) 0.4 MG SL tablet Place 1 tablet (0.4 mg total) under the tongue every 5 (five) minutes as needed for chest pain. 30 tablet 12  . pantoprazole (PROTONIX) 40 MG tablet Take 1 tablet (40 mg total) by mouth daily. 30 tablet 1  . SURE COMFORT PEN NEEDLES 31G X 8 MM MISC USE WITH BOTH INSULIN PENS TWICE DAILY (4 PER DAY) 100 each 5  . torsemide (DEMADEX) 20 MG tablet Take 3 tablets (60 mg total) by mouth daily. 90 tablet 1   No current facility-administered medications for this visit.    PHYSICAL EXAMINATION: ECOG PERFORMANCE STATUS: 1 - Symptomatic but completely ambulatory  Filed Vitals:   08/02/15 1045  BP: 163/62  Pulse: 75  Temp: 98.3 F (36.8 C)  Resp: 18   Filed Weights   08/02/15 1045  Weight: 242 lb 11.2 oz (110.088 kg)    GENERAL:alert, no distress and comfortable SKIN: skin color, texture, turgor are normal, no rashes or significant lesions EYES: normal, Conjunctiva are pink and non-injected, sclera clear OROPHARYNX:no exudate, no erythema and lips, buccal mucosa, and tongue normal  NECK: supple, thyroid normal size, non-tender, without nodularity LYMPH:  no palpable lymphadenopathy in the cervical, axillary or inguinal LUNGS: clear  to auscultation and percussion with normal breathing effort HEART: regular rate & rhythm and no murmurs and no lower extremity edema ABDOMEN:abdomen soft, non-tender and normal bowel sounds Musculoskeletal:no cyanosis of digits and no clubbing  NEURO: alert & oriented x 3 with fluent speech, no focal motor/sensory deficits  LABORATORY DATA:  I have reviewed the data as listed    Component Value Date/Time   NA 140 07/20/2015 0949   NA 139 07/05/2015 1109   K 4.1 07/20/2015 0949   K 3.7 07/05/2015 1109   CL 107 07/20/2015 0949   CO2 23 07/20/2015 0949   CO2 26 07/05/2015 1109   GLUCOSE 150* 07/20/2015 0949   GLUCOSE 89 07/05/2015 1109   BUN 37* 07/20/2015 0949   BUN 75.1* 07/05/2015 1109   CREATININE 2.46* 07/20/2015 0949   CREATININE 3.2* 07/05/2015 1109   CREATININE 3.07* 07/03/2015 2108   CALCIUM 8.9 07/20/2015 0949   CALCIUM 8.9 07/05/2015 1109   PROT 7.1 07/05/2015 1109   PROT 6.4 07/05/2015 1109   PROT 6.0* 07/03/2015 2108   ALBUMIN 3.4* 07/05/2015 1109   ALBUMIN 3.0* 07/03/2015 2108   AST 11 07/05/2015 1109   AST 14* 07/03/2015 2108   ALT 12 07/05/2015 1109   ALT 14* 07/03/2015 2108   ALKPHOS 86 07/05/2015 1109  ALKPHOS 68 07/03/2015 2108   BILITOT 0.36 07/05/2015 1109   BILITOT 0.3 07/03/2015 2108   GFRNONAA 27* 07/20/2015 0949   GFRNONAA 20* 07/03/2015 2108   GFRAA 31* 07/20/2015 0949   GFRAA 23* 07/03/2015 2108    No results found for: SPEP, UPEP  Lab Results  Component Value Date   WBC 10.9* 08/02/2015   NEUTROABS 8.9* 08/02/2015   HGB 8.8* 08/02/2015   HCT 28.6* 08/02/2015   MCV 75.3* 08/02/2015   PLT 185 08/02/2015      Chemistry      Component Value Date/Time   NA 140 07/20/2015 0949   NA 139 07/05/2015 1109   K 4.1 07/20/2015 0949   K 3.7 07/05/2015 1109   CL 107 07/20/2015 0949   CO2 23 07/20/2015 0949   CO2 26 07/05/2015 1109   BUN 37* 07/20/2015 0949   BUN 75.1* 07/05/2015 1109   CREATININE 2.46* 07/20/2015 0949   CREATININE 3.2*  07/05/2015 1109   CREATININE 3.07* 07/03/2015 2108      Component Value Date/Time   CALCIUM 8.9 07/20/2015 0949   CALCIUM 8.9 07/05/2015 1109   ALKPHOS 86 07/05/2015 1109   ALKPHOS 68 07/03/2015 2108   AST 11 07/05/2015 1109   AST 14* 07/03/2015 2108   ALT 12 07/05/2015 1109   ALT 14* 07/03/2015 2108   BILITOT 0.36 07/05/2015 1109   BILITOT 0.3 07/03/2015 2108       ASSESSMENT & PLAN:  CKD (chronic kidney disease) stage 4, GFR 15-29 ml/min (HCC) The patient has significant fluctuation of his serum creatinine, between chronic kidney disease stage III to stage IV. He has been prescribed with increasing doses of Aranesp with without significant success of reducing transfusion requirement. My first thought would be to perform bone marrow aspirate and biopsy. However, at the time of dictation, his serum ferritin is borderline low along with low iron saturation. I have decided to hold off doing a bone marrow biopsy but to proceed with intravenous iron for 2 doses and reassess response to treatment. In the meantime, he will continue to come back here minimum twice a month for close blood count monitoring and transfusion as needed  MGUS (monoclonal gammopathy of unknown significance)  The patient was originally referred here because of detectable M spike in his blood which subsequently become undetectable. 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 Extensive workup last week did not take up any abnormal M spike. Although his serum light chains were high, this is likely related to renal disease and the ratio was just barely above the upper limit of normal. For this, I would recommend repeat follow-up in about 6 months, due in July 2017 In any case, I do not believe his renal failure and anemia is related to multiple myeloma.  Anemia in chronic kidney disease  The patient has a lack of reticulocytosis with relatively poor response with erythropoietin production. I  suspect the cause of his anemia is likely anemia of chronic renal disease along with iron deficiency I will add IV feraheme as above  Resistant hypertension he will continue current medical management. I recommend close follow-up with primary care doctor for medication adjustment.     Orders Placed This Encounter  Procedures  . Kappa/lambda light chains    Standing Status: Future     Number of Occurrences:      Standing Expiration Date: 09/06/2016  . Multiple Myeloma Panel (SPEP&IFE w/QIG)    Standing Status: Future     Number of  Occurrences:      Standing Expiration Date: 09/06/2016  . Comprehensive metabolic panel    Standing Status: Future     Number of Occurrences:      Standing Expiration Date: 09/06/2016   All questions were answered. The patient knows to call the clinic with any problems, questions or concerns. No barriers to learning was detected. I spent 20 minutes counseling the patient face to face. The total time spent in the appointment was 30 minutes and more than 50% was on counseling and review of test results     [facility], [place], MD 08/03/2015 9:42 AM

## 2015-08-03 NOTE — Assessment & Plan Note (Signed)
The patient was originally referred here because of detectable M spike in his blood which subsequently become undetectable. 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 Extensive workup last week did not take up any abnormal M spike. Although his serum light chains were high, this is likely related to renal disease and the ratio was just barely above the upper limit of normal. For this, I would recommend repeat follow-up in about 6 months, due in July 2017 In any case, I do not believe his renal failure and anemia is related to multiple myeloma.

## 2015-08-03 NOTE — Telephone Encounter (Signed)
Per staff message I have changed just treatment appts.

## 2015-08-03 NOTE — Assessment & Plan Note (Signed)
The patient has a lack of reticulocytosis with relatively poor response with erythropoietin production. I suspect the cause of his anemia is likely anemia of chronic renal disease along with iron deficiency I will add IV feraheme as above

## 2015-08-03 NOTE — Assessment & Plan Note (Signed)
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 He has been prescribed with increasing doses of Aranesp with without significant success of reducing transfusion requirement. My first thought would be to perform bone marrow aspirate and biopsy. However, at the time of dictation, his serum ferritin is borderline low along with low iron saturation. I have decided to hold off doing a bone marrow biopsy but to proceed with intravenous iron for 2 doses and reassess response to treatment. In the meantime, he will continue to come back here minimum twice a month for close blood count monitoring and transfusion as needed

## 2015-08-03 NOTE — Telephone Encounter (Signed)
Informed pt of BMBX canceled.  He has been found to have some iron deficiency and [name] has ordered Iron infusion on 7/7 and 7/13.  Lab after Feraheme on 7/13.  Aranesp on 7/14.  Then [name] will see pt again on 7/27.   Pt wrote down appts for 7/7, 7/13 and 7/14.  He will pick up new schedule when he comes next week.

## 2015-08-03 NOTE — Progress Notes (Signed)
Filed Vitals:   08/03/15 1237 08/03/15 1242  BP: 149/67 152/66  Pulse: 70   Temp: 98.8 F (37.1 C)   TempSrc: Oral   Resp: 20   Height: 5\' 8"  (1.727 m)   Weight: 242 lb (109.77 kg)   SpO2: 96%

## 2015-08-03 NOTE — Progress Notes (Addendum)
Established Critical Limb Ischemia Patient  History of Present Illness  Leonard [name] is a 65 y.o. 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  This patient was lost to follow-up.  Prior procedures include:  1.  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 Donnetta Hutching) 2.  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  Ao, BRo (01/06/13)  The patient has been lost to follow-up.  The patient has no rest pain and no wounds currently.  His sx are "pain" in both legs, 3-6/10 severity, no obvious trigger other than walking.  The patient notes symptoms have not progressed.  The patient's treatment regimen currently included: maximal medical management.   The patient's PMH, PSH, SH, and FamHx are unchanged from 04/26/13.  Current Outpatient Prescriptions  Medication Sig Dispense Refill  . acetaminophen (TYLENOL) 500 MG tablet Take 1 tablet (500 mg total) by mouth every 6 (six) hours as needed for mild pain or moderate pain. 30 tablet 0  . ADVAIR DISKUS 250-50 MCG/DOSE AEPB INHALE 1 PUFF INTO THE LUNGS 2 TIMES DAILY 60 each 1  . albuterol (PROVENTIL HFA;VENTOLIN HFA) 108 (90 Base) MCG/ACT inhaler Inhale 2 puffs into the lungs every 6 (six) hours as needed for wheezing or shortness of breath. 1 Inhaler 2  . amLODipine (NORVASC) 10 MG tablet Take 1 tablet (10 mg total) by mouth daily. 90 tablet 1  . aspirin EC 81 MG EC tablet Take 1 tablet (81 mg total) by mouth daily. 30 tablet 0  . atorvastatin (LIPITOR) 40 MG tablet TAKE 1 TABLET BY MOUTH EVERY DAY AT 6 PM 90 tablet 1  . carvedilol (COREG) 25 MG tablet Take 2 tablets (50 mg total) by mouth 2 (two) times daily with a meal. 180 tablet 3  . doxazosin (CARDURA) 2 MG tablet Take 1 tablet (2 mg total) by mouth every 12 (twelve) hours. 60 tablet 0  . hydrALAZINE (APRESOLINE) 100 MG tablet TAKE 1 TABLET (100MG ) BY MOUTH EVERY 8 HOURS 90 tablet 1    . insulin aspart (NOVOLOG FLEXPEN) 100 UNIT/ML FlexPen Inject 35 Units into the skin 3 (three) times daily with meals. 15 mL 6  . Insulin Glargine (LANTUS SOLOSTAR) 100 UNIT/ML Solostar Pen INJECT 65 UNITS INTO THE SKIN 2 TIMES DAILY 15 mL 2  . isosorbide mononitrate (IMDUR) 60 MG 24 hr tablet TAKE ONE (1) TABLET BY MOUTH EVERY DAY 30 tablet 3  . levothyroxine (SYNTHROID, LEVOTHROID) 50 MCG tablet Take 1 tablet (50 mcg total) by mouth daily before breakfast. 30 tablet 2  . nitroGLYCERIN (NITROSTAT) 0.4 MG SL tablet Place 1 tablet (0.4 mg total) under the tongue every 5 (five) minutes as needed for chest pain. 30 tablet 12  . pantoprazole (PROTONIX) 40 MG tablet Take 1 tablet (40 mg total) by mouth daily. 30 tablet 1  . SURE COMFORT PEN NEEDLES 31G X 8 MM MISC USE WITH BOTH INSULIN PENS TWICE DAILY (4 PER DAY) 100 each 5  . torsemide (DEMADEX) 20 MG tablet Take 3 tablets (60 mg total) by mouth daily. 90 tablet 1   No current facility-administered medications for this visit.    No Known Allergies  On ROS today: no rest pain, no wounds   Physical Examination  Filed Vitals:   08/03/15 1237 08/03/15 1242  BP: 149/67 152/66  Pulse: 70   Temp: 98.8 F (37.1 C)   TempSrc:  Oral   Resp: 20   Height: 5\' 8"  (1.727 m)   Weight: 242 lb (109.77 kg)   SpO2: 96%    Body mass index is 36.8 kg/(m^2).  General: A&O x 3, WD, mildly obese  Eyes: PERRLA, EOMI, Post surg chg to pupils   Pulmonary: Sym exp, good air movt, CTAB, no rales, rhonchi, & wheezing  Cardiac: RRR, Nl S1, S2, no Murmurs, rubs or gallops  Vascular: Vessel Right Left  Radial Palpable Palpable  Brachial Palpable Palpable  Carotid Palpable, without bruit Palpable, without bruit  Aorta Not palpable N/A  Femoral Palpable Palpable  Popliteal Not palpable Not palpable  PT Not Palpable Not Palpable  DP Not Palpable Not Palpable   Gastrointestinal: soft, NTND, no G/R, no HSM, no masses, no CVAT B  Musculoskeletal: M/S 5/5  throughout , Extremities without ischemic changes, healed R 4th and 5th ray amp, BLE edema 2+  Neurologic: Pain and light touch intact in extremities, Motor exam as listed above  Non-Invasive Vascular Imaging ABI (Date: 06/30/15)  R: 0.48, AT: occluded, PT: mono  L: 0.57, DP: mono, PT: mono  BLE arterial duplex (08/03/2015)  R: triphasic CFA, PFA, and proximal SFA, occlusion in mid-segment, reconstitution shortly after occlusion, monophasic tibials  L: triphasic CFA, reset of leg monophasic, multiple stenoses   Medical Decision Making  Leonard CROXFORD is a 65 y.o. male who presents with: RLE critical limb ischemia with healed wounds, essentially now BLE intermittent claudication, IDDM, CHF, CAD s/p CABG, CKD Stage IV   Not surprised with occlusion of R SFA given limited patency of endovascular interventions.  At this point, the patient denies any lifestyle limiting sx, so no indications to repeat angiography especially with CKD stage IV.  Based on the patient's vascular studies and examination, I have offered the patient: q3 ABI.  I discussed in depth with the patient the nature of atherosclerosis, and emphasized the importance of maximal medical management including strict control of blood pressure, blood glucose, and lipid levels, antiplatelet agents, obtaining regular exercise, and cessation of smoking.    The patient is aware that without maximal medical management the underlying atherosclerotic disease process will progress, limiting the benefit of any interventions. The patient is currently on a statin: Lipitor. physician.   The patient is currently on an anti-platelet: ASA.  Thank you for allowing [place] to participate in this patient's care.   Adele Barthel, MD, FACS [facility] Office: [phone] Pager: [phone]

## 2015-08-03 NOTE — Assessment & Plan Note (Signed)
he will continue current medical management. I recommend close follow-up with primary care doctor for medication adjustment.  

## 2015-08-06 ENCOUNTER — Ambulatory Visit (HOSPITAL_COMMUNITY)
Admission: RE | Admit: 2015-08-06 | Discharge: 2015-08-06 | Disposition: A | Discharge status: Home / Self Care | Payer: Medicaid Other | Source: Ambulatory Visit | Attending: Hematology and Oncology | Admitting: Hematology and Oncology

## 2015-08-06 ENCOUNTER — Other Ambulatory Visit: Payer: Self-pay

## 2015-08-06 ENCOUNTER — Encounter (HOSPITAL_COMMUNITY): Payer: Self-pay

## 2015-08-06 ENCOUNTER — Inpatient Hospital Stay (HOSPITAL_COMMUNITY)
Admission: EM | Admit: 2015-08-06 | Discharge: 2015-08-09 | DRG: 291 | Disposition: A | Discharge status: Home Health | Payer: Medicaid Other | Attending: Family Medicine | Admitting: Family Medicine

## 2015-08-06 ENCOUNTER — Emergency Department (HOSPITAL_COMMUNITY): Payer: Medicaid Other

## 2015-08-06 DIAGNOSIS — I5033 Acute on chronic diastolic (congestive) heart failure: Secondary | ICD-10-CM | POA: Diagnosis present

## 2015-08-06 DIAGNOSIS — I252 Old myocardial infarction: Secondary | ICD-10-CM

## 2015-08-06 DIAGNOSIS — I25110 Atherosclerotic heart disease of native coronary artery with unstable angina pectoris: Secondary | ICD-10-CM | POA: Diagnosis present

## 2015-08-06 DIAGNOSIS — E876 Hypokalemia: Secondary | ICD-10-CM | POA: Diagnosis present

## 2015-08-06 DIAGNOSIS — Z9981 Dependence on supplemental oxygen: Secondary | ICD-10-CM

## 2015-08-06 DIAGNOSIS — Z79899 Other long term (current) drug therapy: Secondary | ICD-10-CM

## 2015-08-06 DIAGNOSIS — J449 Chronic obstructive pulmonary disease, unspecified: Secondary | ICD-10-CM | POA: Diagnosis present

## 2015-08-06 DIAGNOSIS — E1121 Type 2 diabetes mellitus with diabetic nephropathy: Secondary | ICD-10-CM

## 2015-08-06 DIAGNOSIS — I10 Essential (primary) hypertension: Secondary | ICD-10-CM

## 2015-08-06 DIAGNOSIS — N184 Chronic kidney disease, stage 4 (severe): Secondary | ICD-10-CM | POA: Diagnosis present

## 2015-08-06 DIAGNOSIS — Z7982 Long term (current) use of aspirin: Secondary | ICD-10-CM

## 2015-08-06 DIAGNOSIS — Z87891 Personal history of nicotine dependence: Secondary | ICD-10-CM

## 2015-08-06 DIAGNOSIS — Z951 Presence of aortocoronary bypass graft: Secondary | ICD-10-CM

## 2015-08-06 DIAGNOSIS — E782 Mixed hyperlipidemia: Secondary | ICD-10-CM | POA: Diagnosis present

## 2015-08-06 DIAGNOSIS — Z89421 Acquired absence of other right toe(s): Secondary | ICD-10-CM

## 2015-08-06 DIAGNOSIS — K219 Gastro-esophageal reflux disease without esophagitis: Secondary | ICD-10-CM | POA: Diagnosis present

## 2015-08-06 DIAGNOSIS — I5032 Chronic diastolic (congestive) heart failure: Secondary | ICD-10-CM

## 2015-08-06 DIAGNOSIS — Z794 Long term (current) use of insulin: Secondary | ICD-10-CM

## 2015-08-06 DIAGNOSIS — R079 Chest pain, unspecified: Secondary | ICD-10-CM

## 2015-08-06 DIAGNOSIS — E1122 Type 2 diabetes mellitus with diabetic chronic kidney disease: Secondary | ICD-10-CM | POA: Diagnosis present

## 2015-08-06 DIAGNOSIS — E1151 Type 2 diabetes mellitus with diabetic peripheral angiopathy without gangrene: Secondary | ICD-10-CM | POA: Diagnosis present

## 2015-08-06 DIAGNOSIS — E039 Hypothyroidism, unspecified: Secondary | ICD-10-CM | POA: Diagnosis present

## 2015-08-06 DIAGNOSIS — I130 Hypertensive heart and chronic kidney disease with heart failure and stage 1 through stage 4 chronic kidney disease, or unspecified chronic kidney disease: Principal | ICD-10-CM | POA: Diagnosis present

## 2015-08-06 DIAGNOSIS — E785 Hyperlipidemia, unspecified: Secondary | ICD-10-CM

## 2015-08-06 DIAGNOSIS — I509 Heart failure, unspecified: Secondary | ICD-10-CM

## 2015-08-06 DIAGNOSIS — Z6836 Body mass index (BMI) 36.0-36.9, adult: Secondary | ICD-10-CM

## 2015-08-06 DIAGNOSIS — D509 Iron deficiency anemia, unspecified: Secondary | ICD-10-CM | POA: Diagnosis present

## 2015-08-06 DIAGNOSIS — Z7951 Long term (current) use of inhaled steroids: Secondary | ICD-10-CM

## 2015-08-06 DIAGNOSIS — Z8249 Family history of ischemic heart disease and other diseases of the circulatory system: Secondary | ICD-10-CM

## 2015-08-06 DIAGNOSIS — Z801 Family history of malignant neoplasm of trachea, bronchus and lung: Secondary | ICD-10-CM

## 2015-08-06 DIAGNOSIS — Z95820 Peripheral vascular angioplasty status with implants and grafts: Secondary | ICD-10-CM

## 2015-08-06 LAB — COMPREHENSIVE METABOLIC PANEL
ALT: 15 U/L — ABNORMAL LOW (ref 17–63)
ANION GAP: 9 (ref 5–15)
AST: 15 U/L (ref 15–41)
Albumin: 3.2 g/dL — ABNORMAL LOW (ref 3.5–5.0)
Alkaline Phosphatase: 69 U/L (ref 38–126)
BUN: 43 mg/dL — ABNORMAL HIGH (ref 6–20)
CHLORIDE: 103 mmol/L (ref 101–111)
CO2: 25 mmol/L (ref 22–32)
CREATININE: 3.16 mg/dL — AB (ref 0.61–1.24)
Calcium: 8.7 mg/dL — ABNORMAL LOW (ref 8.9–10.3)
GFR, EST AFRICAN AMERICAN: 22 mL/min — AB (ref >60)
GFR, EST NON AFRICAN AMERICAN: 19 mL/min — AB (ref >60)
Glucose, Bld: 227 mg/dL — ABNORMAL HIGH (ref 65–99)
POTASSIUM: 3.3 mmol/L — AB (ref 3.5–5.1)
SODIUM: 137 mmol/L (ref 135–145)
Total Bilirubin: 0.5 mg/dL (ref 0.3–1.2)
Total Protein: 6.6 g/dL (ref 6.5–8.1)

## 2015-08-06 LAB — CBC
HCT: 27.9 % — ABNORMAL LOW (ref 39.0–52.0)
Hemoglobin: 8.3 g/dL — ABNORMAL LOW (ref 13.0–17.0)
MCH: 22.4 pg — ABNORMAL LOW (ref 26.0–34.0)
MCHC: 29.7 g/dL — ABNORMAL LOW (ref 30.0–36.0)
MCV: 75.4 fL — ABNORMAL LOW (ref 78.0–100.0)
PLATELETS: 220 10*3/uL (ref 150–400)
RBC: 3.7 MIL/uL — AB (ref 4.22–5.81)
RDW: 18.7 % — AB (ref 11.5–15.5)
WBC: 7.3 10*3/uL (ref 4.0–10.5)

## 2015-08-06 LAB — I-STAT ARTERIAL BLOOD GAS, ED
ACID-BASE EXCESS: 1 mmol/L (ref 0.0–2.0)
BICARBONATE: 25.4 meq/L — AB (ref 20.0–24.0)
O2 SAT: 93 %
TCO2: 26 mmol/L (ref 0–100)
pCO2 arterial: 36.7 mmHg (ref 35.0–45.0)
pH, Arterial: 7.448 (ref 7.350–7.450)
pO2, Arterial: 62 mmHg — ABNORMAL LOW (ref 80.0–100.0)

## 2015-08-06 LAB — BRAIN NATRIURETIC PEPTIDE: B NATRIURETIC PEPTIDE 5: 285.9 pg/mL — AB (ref 0.0–100.0)

## 2015-08-06 LAB — I-STAT TROPONIN, ED
TROPONIN I, POC: 0.01 ng/mL (ref 0.00–0.08)
TROPONIN I, POC: 0.02 ng/mL (ref 0.00–0.08)

## 2015-08-06 LAB — LIPASE, BLOOD: LIPASE: 39 U/L (ref 11–51)

## 2015-08-06 NOTE — ED Notes (Signed)
Per EMS: Pt with sharp chest pain that radiates to abdomen x 1 hour, EMS gave 2 nitros, 324 ASA, pain has resolved at this time. Denies any N/V or diaphoresis.

## 2015-08-06 NOTE — ED Provider Notes (Signed)
CSN: [id number]     Arrival date & time 08/06/15  1654 History   First MD Initiated Contact with Patient 08/06/15 1702     Chief Complaint  Patient presents with  . Chest Pain   HPI Leonard [name] is a 65 y.o. male PMH significant for HTN, PAD, HLD, DM, CHF (LVEF 60-65% May 2017) presenting with midsternal chest pain x 1 hour. He describes his chest pain as radiating to his abdomen, constant, improved with 2 nitro, 324 aspirin, resolved currently. He denies HA, SOB, N/V.   Past Medical History  Diagnosis Date  . Hypertension   . PAD (peripheral artery disease) ([place])   . Gangrene of toe ([place])     right 5th/notes 01/04/2013   . High cholesterol   . Type II diabetes mellitus ([place])   . 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 CHF (congestive heart failure) ([place])   . Shortness of breath dyspnea   . Iron deficiency anemia 08/03/2015   Past Surgical History  Procedure Laterality Date  . Throat surgery  1983    polyps  . Angioplasty / stenting femoral Right 01/13/2013    superficial femoral artery x 2 (3 mm x 60 mm, 4 mm x 60 mm)  . Tonsillectomy and adenoidectomy  ~ 1965  . Amputation Right 01/28/2013    Procedure: RAY AMPUTATION RIGHT  4th & 5th TOE;  Surgeon: Rosetta Posner, MD;  Location: [facility] OR;  Service: Vascular;  Laterality: Right;  . Coronary artery bypass graft N/A 04/07/2013    Procedure: CORONARY ARTERY BYPASS GRAFTING (CABG);  Surgeon: Ivin Poot, MD;  Location: [facility];  Service: Open Heart Surgery;  Laterality: N/A;  . Intraoperative transesophageal echocardiogram N/A 04/07/2013    Procedure: INTRAOPERATIVE TRANSESOPHAGEAL ECHOCARDIOGRAM;  Surgeon: Ivin Poot, MD;  Location: [facility];  Service: Open Heart Surgery;  Laterality: N/A;  . Lower extremity angiogram Bilateral 01/06/2013    Procedure: LOWER EXTREMITY ANGIOGRAM;  Surgeon: Conrad Railroad, MD;  Location: [facility] CATH LAB;  Service: Cardiovascular;  Laterality: Bilateral;  . Lower extremity angiogram  Right 01/13/2013    Procedure: LOWER EXTREMITY ANGIOGRAM;  Surgeon: Conrad , MD;  Location: [facility] CATH LAB;  Service: Cardiovascular;  Laterality: Right;  . Left heart catheterization with coronary angiogram N/A 04/05/2013    Procedure: LEFT HEART CATHETERIZATION WITH CORONARY ANGIOGRAM;  Surgeon: Peter M Martinique, MD;  Location: [facility] CATH LAB;  Service: Cardiovascular;  Laterality: N/A;  . Skin cancer excision  2017   Family History  Problem Relation Age of Onset  . Cancer Mother     lung cancer  . Hypertension Mother   . Cancer Sister     patient thinks it was uterine cancer  . Hypertension Sister   . Heart attack Sister   . Stroke Neg Hx    Social History  Substance Use Topics  . Smoking status: Former Smoker -- 1.00 packs/day for 46 years    Types: Cigarettes    Start date: 02/04/1967    Quit date: 02/03/2013  . Smokeless tobacco: Never Used     Comment: Doing well with quitting.  . Alcohol Use: No    Review of Systems  Ten systems are reviewed and are negative for acute change except as noted in the HPI  Allergies  Review of patient's allergies indicates no known allergies.  Home Medications   Prior to Admission medications   Medication Sig Start Date End Date Taking? Authorizing Provider  acetaminophen (TYLENOL)  500 MG tablet Take 1 tablet (500 mg total) by mouth every 6 (six) hours as needed for mild pain or moderate pain. 06/06/15   Mercy Riding, MD  ADVAIR DISKUS 250-50 MCG/DOSE AEPB INHALE 1 PUFF INTO THE LUNGS 2 TIMES DAILY 06/25/15   Rosemarie Ax, MD  albuterol (PROVENTIL HFA;VENTOLIN HFA) 108 (90 Base) MCG/ACT inhaler Inhale 2 puffs into the lungs every 6 (six) hours as needed for wheezing or shortness of breath. 06/12/15   Hillary Corinda Gubler, MD  amLODipine (NORVASC) 10 MG tablet Take 1 tablet (10 mg total) by mouth daily. 03/23/15   Zenia Resides, MD  aspirin EC 81 MG EC tablet Take 1 tablet (81 mg total) by mouth daily. 06/06/15   Mercy Riding, MD   atorvastatin (LIPITOR) 40 MG tablet TAKE 1 TABLET BY MOUTH EVERY DAY AT 6 PM 03/23/15   Zenia Resides, MD  carvedilol (COREG) 25 MG tablet Take 2 tablets (50 mg total) by mouth 2 (two) times daily with a meal. 04/05/15   Zenia Resides, MD  doxazosin (CARDURA) 2 MG tablet Take 1 tablet (2 mg total) by mouth every 12 (twelve) hours. 07/02/15   Sela Hua, MD  hydrALAZINE (APRESOLINE) 100 MG tablet TAKE 1 TABLET (100MG ) BY MOUTH EVERY 8 HOURS 07/31/15   Rosemarie Ax, MD  insulin aspart (NOVOLOG FLEXPEN) 100 UNIT/ML FlexPen Inject 35 Units into the skin 3 (three) times daily with meals. 05/17/15   Zenia Resides, MD  Insulin Glargine (LANTUS SOLOSTAR) 100 UNIT/ML Solostar Pen INJECT 65 UNITS INTO THE SKIN 2 TIMES DAILY 06/26/15   Rosemarie Ax, MD  isosorbide mononitrate (IMDUR) 60 MG 24 hr tablet TAKE ONE (1) TABLET BY MOUTH EVERY DAY 08/02/15   Rosemarie Ax, MD  levothyroxine (SYNTHROID, LEVOTHROID) 50 MCG tablet Take 1 tablet (50 mcg total) by mouth daily before breakfast. 06/22/15   Sela Hua, MD  nitroGLYCERIN (NITROSTAT) 0.4 MG SL tablet Place 1 tablet (0.4 mg total) under the tongue every 5 (five) minutes as needed for chest pain. 06/06/15   Mercy Riding, MD  pantoprazole (PROTONIX) 40 MG tablet Take 1 tablet (40 mg total) by mouth daily. 06/06/15   Mercy Riding, MD  SURE COMFORT PEN NEEDLES 31G X 8 MM MISC USE WITH BOTH INSULIN PENS TWICE DAILY (4 PER DAY) 06/14/15   Olin Hauser, DO  torsemide (DEMADEX) 20 MG tablet Take 3 tablets (60 mg total) by mouth daily. 08/02/15   Rosemarie Ax, MD   BP 185/80 mmHg  Pulse 77  Temp(Src) 98.8 F (37.1 C) (Oral)  Resp 22  SpO2 94% Physical Exam  Constitutional: He appears well-developed and well-nourished. No distress.  Obese  HENT:  Head: Normocephalic and atraumatic.  Mouth/Throat: Oropharynx is clear and moist. No oropharyngeal exudate.  Eyes: Conjunctivae are normal. Pupils are equal, round, and reactive to light.  Right eye exhibits no discharge. Left eye exhibits no discharge. No scleral icterus.  Neck: No tracheal deviation present.  Cardiovascular: Normal rate, regular rhythm, normal heart sounds and intact distal pulses.  Exam reveals no gallop and no friction rub.   No murmur heard. Pulmonary/Chest: Effort normal and breath sounds normal. No respiratory distress. He has no wheezes. He has no rales. He exhibits no tenderness.  Abdominal: Soft. Bowel sounds are normal. He exhibits no distension and no mass. There is no tenderness. There is no rebound and no guarding.  Musculoskeletal: He exhibits no edema.  Lymphadenopathy:    He has no cervical adenopathy.  Neurological: He is alert. Coordination normal.  Skin: Skin is warm and dry. No rash noted. He is not diaphoretic. No erythema.  Psychiatric: He has a normal mood and affect. His behavior is normal.  Nursing note and vitals reviewed.  ED Course  Procedures  Labs Review Labs Reviewed  CBC  BRAIN NATRIURETIC PEPTIDE  COMPREHENSIVE METABOLIC PANEL  LIPASE, BLOOD  I-STAT [place], ED   Imaging Review Dg Chest 2 View  08/06/2015  CLINICAL DATA:  Left-sided chest pain today.  Shortness of breath. EXAM: CHEST  2 VIEW COMPARISON:  07/03/2015 FINDINGS: Patient is post median sternotomy. Improved lung volumes from prior exam. Cardiomegaly mediastinal contours are unchanged. Vascular congestion appears chronic. Increased mixed perihilar opacities suspicious for pulmonary edema. Minimal fluid in the fissures, no large subpulmonic effusion. No confluent airspace disease or pneumothorax. Osseous structures are unchanged. IMPRESSION: 1. Increased interstitial edema from prior exam. 2. Chronic cardiomegaly and vascular congestion. Electronically Signed   By: Jeb Levering M.D.   On: 08/06/2015 21:20   I have personally reviewed and evaluated these images and lab results as part of my medical decision-making.   EKG Interpretation   Date/Time:  Monday  August 06 2015 17:00:14 EDT Ventricular Rate:  78 PR Interval:    QRS Duration: 100 QT Interval:  423 QTC Calculation: 482 R Axis:   42 Text Interpretation:  Sinus rhythm Probable left atrial enlargement  Anteroseptal infarct, old Minimal ST depression, inferior leads Confirmed  by [facility] MD, Corene Cornea [phone]) on 08/06/2015 6:13:31 PM      MDM   Final diagnoses:  Chest pain, unspecified chest pain type   CXR with increased interstitial edema from prior exam; chronic cardiomegaly and vascular congestion. Atypical chest pain but given risk factors for ACS, patient will need to be admitted for chest pain rule out. Doubt AAA rupture/dissection, PE.   Newaygo Lions, PA-C 08/19/15 [place], MD 08/22/15 [phone]

## 2015-08-06 NOTE — ED Provider Notes (Signed)
Medical screening examination/treatment/procedure(s) were conducted as a shared visit with non-physician practitioner(s) and myself.  I personally evaluated the patient during the encounter.  noncpecific chest pain. Long medical history. Many risk factors for ACS. HTN as well. On my exam, pain free. No ttp. Story atypical but with risk factors and HTN, will likely admit for obs and rule out.    EKG Interpretation   Date/Time:  Monday August 06 2015 17:00:14 EDT Ventricular Rate:  78 PR Interval:    QRS Duration: 100 QT Interval:  423 QTC Calculation: 482 R Axis:   42 Text Interpretation:  Sinus rhythm Probable left atrial enlargement  Anteroseptal infarct, old Minimal ST depression, inferior leads Confirmed  by [facility] MD, Saidi Santacroce [phone]) on 08/06/2015 6:13:31 PM        Merrily Pew, MD 08/06/15 2353

## 2015-08-06 NOTE — ED Notes (Signed)
Pt SPO2 dropping to 85-88% on 4L [place], pt placed on a NRM and SPO2 of 97%, EDP to be notified.

## 2015-08-06 NOTE — ED Notes (Signed)
Admitting MD at bedside evaluating pt.

## 2015-08-07 DIAGNOSIS — Z95820 Peripheral vascular angioplasty status with implants and grafts: Secondary | ICD-10-CM | POA: Diagnosis not present

## 2015-08-07 DIAGNOSIS — Z794 Long term (current) use of insulin: Secondary | ICD-10-CM | POA: Diagnosis not present

## 2015-08-07 DIAGNOSIS — I5023 Acute on chronic systolic (congestive) heart failure: Secondary | ICD-10-CM

## 2015-08-07 DIAGNOSIS — Z7982 Long term (current) use of aspirin: Secondary | ICD-10-CM | POA: Diagnosis not present

## 2015-08-07 DIAGNOSIS — I5032 Chronic diastolic (congestive) heart failure: Secondary | ICD-10-CM | POA: Diagnosis not present

## 2015-08-07 DIAGNOSIS — R079 Chest pain, unspecified: Secondary | ICD-10-CM | POA: Diagnosis not present

## 2015-08-07 DIAGNOSIS — Z8249 Family history of ischemic heart disease and other diseases of the circulatory system: Secondary | ICD-10-CM | POA: Diagnosis not present

## 2015-08-07 DIAGNOSIS — Z87891 Personal history of nicotine dependence: Secondary | ICD-10-CM | POA: Diagnosis not present

## 2015-08-07 DIAGNOSIS — I5033 Acute on chronic diastolic (congestive) heart failure: Secondary | ICD-10-CM | POA: Diagnosis present

## 2015-08-07 DIAGNOSIS — Z6836 Body mass index (BMI) 36.0-36.9, adult: Secondary | ICD-10-CM | POA: Diagnosis not present

## 2015-08-07 DIAGNOSIS — K219 Gastro-esophageal reflux disease without esophagitis: Secondary | ICD-10-CM | POA: Diagnosis present

## 2015-08-07 DIAGNOSIS — E876 Hypokalemia: Secondary | ICD-10-CM | POA: Diagnosis present

## 2015-08-07 DIAGNOSIS — E782 Mixed hyperlipidemia: Secondary | ICD-10-CM | POA: Diagnosis present

## 2015-08-07 DIAGNOSIS — Z801 Family history of malignant neoplasm of trachea, bronchus and lung: Secondary | ICD-10-CM | POA: Diagnosis not present

## 2015-08-07 DIAGNOSIS — E785 Hyperlipidemia, unspecified: Secondary | ICD-10-CM | POA: Diagnosis not present

## 2015-08-07 DIAGNOSIS — Z7951 Long term (current) use of inhaled steroids: Secondary | ICD-10-CM | POA: Diagnosis not present

## 2015-08-07 DIAGNOSIS — Z9981 Dependence on supplemental oxygen: Secondary | ICD-10-CM | POA: Diagnosis not present

## 2015-08-07 DIAGNOSIS — I10 Essential (primary) hypertension: Secondary | ICD-10-CM | POA: Diagnosis not present

## 2015-08-07 DIAGNOSIS — J449 Chronic obstructive pulmonary disease, unspecified: Secondary | ICD-10-CM | POA: Diagnosis present

## 2015-08-07 DIAGNOSIS — Z951 Presence of aortocoronary bypass graft: Secondary | ICD-10-CM | POA: Diagnosis not present

## 2015-08-07 DIAGNOSIS — N184 Chronic kidney disease, stage 4 (severe): Secondary | ICD-10-CM | POA: Diagnosis present

## 2015-08-07 DIAGNOSIS — Z89421 Acquired absence of other right toe(s): Secondary | ICD-10-CM | POA: Diagnosis not present

## 2015-08-07 DIAGNOSIS — Z79899 Other long term (current) drug therapy: Secondary | ICD-10-CM | POA: Diagnosis not present

## 2015-08-07 DIAGNOSIS — I509 Heart failure, unspecified: Secondary | ICD-10-CM

## 2015-08-07 DIAGNOSIS — I130 Hypertensive heart and chronic kidney disease with heart failure and stage 1 through stage 4 chronic kidney disease, or unspecified chronic kidney disease: Secondary | ICD-10-CM | POA: Diagnosis not present

## 2015-08-07 DIAGNOSIS — E039 Hypothyroidism, unspecified: Secondary | ICD-10-CM | POA: Diagnosis present

## 2015-08-07 DIAGNOSIS — E1122 Type 2 diabetes mellitus with diabetic chronic kidney disease: Secondary | ICD-10-CM | POA: Diagnosis present

## 2015-08-07 DIAGNOSIS — D509 Iron deficiency anemia, unspecified: Secondary | ICD-10-CM | POA: Diagnosis present

## 2015-08-07 DIAGNOSIS — E1151 Type 2 diabetes mellitus with diabetic peripheral angiopathy without gangrene: Secondary | ICD-10-CM | POA: Diagnosis present

## 2015-08-07 DIAGNOSIS — I252 Old myocardial infarction: Secondary | ICD-10-CM | POA: Diagnosis not present

## 2015-08-07 DIAGNOSIS — I25110 Atherosclerotic heart disease of native coronary artery with unstable angina pectoris: Secondary | ICD-10-CM | POA: Diagnosis present

## 2015-08-07 LAB — BASIC METABOLIC PANEL
Anion gap: 10 (ref 5–15)
BUN: 46 mg/dL — AB (ref 6–20)
CALCIUM: 8.6 mg/dL — AB (ref 8.9–10.3)
CO2: 24 mmol/L (ref 22–32)
CREATININE: 2.87 mg/dL — AB (ref 0.61–1.24)
Chloride: 105 mmol/L (ref 101–111)
GFR calc non Af Amer: 22 mL/min — ABNORMAL LOW (ref >60)
GFR, EST AFRICAN AMERICAN: 25 mL/min — AB (ref >60)
GLUCOSE: 248 mg/dL — AB (ref 65–99)
Potassium: 3.5 mmol/L (ref 3.5–5.1)
Sodium: 139 mmol/L (ref 135–145)

## 2015-08-07 LAB — MRSA PCR SCREENING: MRSA BY PCR: NEGATIVE

## 2015-08-07 LAB — CBC
HCT: 29.6 % — ABNORMAL LOW (ref 39.0–52.0)
Hemoglobin: 9 g/dL — ABNORMAL LOW (ref 13.0–17.0)
MCH: 23.1 pg — AB (ref 26.0–34.0)
MCHC: 30.4 g/dL (ref 30.0–36.0)
MCV: 75.9 fL — ABNORMAL LOW (ref 78.0–100.0)
PLATELETS: 229 10*3/uL (ref 150–400)
RBC: 3.9 MIL/uL — ABNORMAL LOW (ref 4.22–5.81)
RDW: 18.6 % — ABNORMAL HIGH (ref 11.5–15.5)
WBC: 9 10*3/uL (ref 4.0–10.5)

## 2015-08-07 LAB — TROPONIN I
Troponin I: 0.03 ng/mL (ref <0.03)
Troponin I: 0.03 ng/mL (ref <0.03)

## 2015-08-07 LAB — GLUCOSE, CAPILLARY
GLUCOSE-CAPILLARY: 140 mg/dL — AB (ref 65–99)
GLUCOSE-CAPILLARY: 76 mg/dL (ref 65–99)
Glucose-Capillary: 244 mg/dL — ABNORMAL HIGH (ref 65–99)
Glucose-Capillary: 262 mg/dL — ABNORMAL HIGH (ref 65–99)
Glucose-Capillary: 279 mg/dL — ABNORMAL HIGH (ref 65–99)
Glucose-Capillary: 219 mg/dL — ABNORMAL HIGH (ref 65–99)

## 2015-08-07 MED ORDER — LEVOTHYROXINE SODIUM 50 MCG PO TABS
50.0000 ug | ORAL_TABLET | Freq: Every day | ORAL | Status: DC
  Administered 2015-08-07 – 2015-08-09 (×3): 50 ug via ORAL
  Filled 2015-08-07 (×3): qty 1

## 2015-08-07 MED ORDER — ALBUTEROL SULFATE HFA 108 (90 BASE) MCG/ACT IN AERS: 2.0000 | INHALATION_SPRAY | Freq: Four times a day (QID) | RESPIRATORY_TRACT | Status: DC | PRN

## 2015-08-07 MED ORDER — INSULIN GLARGINE 100 UNIT/ML SOLOSTAR PEN: 30.0000 [IU] | PEN_INJECTOR | Freq: Two times a day (BID) | SUBCUTANEOUS | Status: DC

## 2015-08-07 MED ORDER — ISOSORBIDE MONONITRATE ER 60 MG PO TB24
60.0000 mg | ORAL_TABLET | Freq: Every day | ORAL | Status: DC
  Administered 2015-08-07 – 2015-08-09 (×3): 60 mg via ORAL
  Filled 2015-08-07 (×3): qty 1

## 2015-08-07 MED ORDER — PANTOPRAZOLE SODIUM 40 MG PO TBEC
40.0000 mg | DELAYED_RELEASE_TABLET | Freq: Every day | ORAL | Status: DC
  Administered 2015-08-07 – 2015-08-09 (×3): 40 mg via ORAL
  Filled 2015-08-07 (×4): qty 1

## 2015-08-07 MED ORDER — POTASSIUM CHLORIDE CRYS ER 20 MEQ PO TBCR
40.0000 meq | EXTENDED_RELEASE_TABLET | Freq: Two times a day (BID) | ORAL | Status: AC
  Administered 2015-08-07 (×2): 40 meq via ORAL
  Filled 2015-08-07 (×2): qty 2

## 2015-08-07 MED ORDER — ACETAMINOPHEN 650 MG RE SUPP: 650.0000 mg | Freq: Four times a day (QID) | RECTAL | Status: DC | PRN

## 2015-08-07 MED ORDER — ALBUTEROL SULFATE (2.5 MG/3ML) 0.083% IN NEBU: 2.5000 mg | INHALATION_SOLUTION | Freq: Four times a day (QID) | RESPIRATORY_TRACT | Status: DC | PRN

## 2015-08-07 MED ORDER — HYDRALAZINE HCL 50 MG PO TABS
100.0000 mg | ORAL_TABLET | Freq: Three times a day (TID) | ORAL | Status: DC
  Administered 2015-08-07 – 2015-08-09 (×8): 100 mg via ORAL
  Filled 2015-08-07 (×11): qty 2

## 2015-08-07 MED ORDER — FUROSEMIDE 10 MG/ML IJ SOLN
100.0000 mg | Freq: Once | INTRAVENOUS | Status: AC
  Administered 2015-08-07: 100 mg via INTRAVENOUS
  Filled 2015-08-07: qty 10

## 2015-08-07 MED ORDER — CARVEDILOL 25 MG PO TABS
50.0000 mg | ORAL_TABLET | Freq: Two times a day (BID) | ORAL | Status: DC
  Administered 2015-08-07 – 2015-08-09 (×6): 50 mg via ORAL
  Filled 2015-08-07 (×6): qty 2

## 2015-08-07 MED ORDER — INSULIN ASPART 100 UNIT/ML ~~LOC~~ SOLN
20.0000 [IU] | Freq: Three times a day (TID) | SUBCUTANEOUS | Status: DC
  Administered 2015-08-07 – 2015-08-08 (×5): 20 [IU] via SUBCUTANEOUS

## 2015-08-07 MED ORDER — FUROSEMIDE 10 MG/ML IJ SOLN
120.0000 mg | Freq: Once | INTRAVENOUS | Status: AC
  Administered 2015-08-07: 120 mg via INTRAVENOUS
  Filled 2015-08-07: qty 12

## 2015-08-07 MED ORDER — HYDRALAZINE HCL 20 MG/ML IJ SOLN: 10.0000 mg | INTRAMUSCULAR | Status: DC | PRN

## 2015-08-07 MED ORDER — ASPIRIN EC 81 MG PO TBEC
81.0000 mg | DELAYED_RELEASE_TABLET | Freq: Every day | ORAL | Status: DC
  Administered 2015-08-07 – 2015-08-09 (×3): 81 mg via ORAL
  Filled 2015-08-07 (×3): qty 1

## 2015-08-07 MED ORDER — AMLODIPINE BESYLATE 10 MG PO TABS
10.0000 mg | ORAL_TABLET | Freq: Every day | ORAL | Status: DC
  Administered 2015-08-07 – 2015-08-09 (×3): 10 mg via ORAL
  Filled 2015-08-07 (×3): qty 1

## 2015-08-07 MED ORDER — MOMETASONE FURO-FORMOTEROL FUM 200-5 MCG/ACT IN AERO
2.0000 | INHALATION_SPRAY | Freq: Two times a day (BID) | RESPIRATORY_TRACT | Status: DC
  Administered 2015-08-09: 2 via RESPIRATORY_TRACT
  Filled 2015-08-07 (×3): qty 8.8

## 2015-08-07 MED ORDER — ACETAMINOPHEN 325 MG PO TABS: 650.0000 mg | ORAL_TABLET | Freq: Four times a day (QID) | ORAL | Status: DC | PRN

## 2015-08-07 MED ORDER — SODIUM CHLORIDE 0.9% FLUSH: 10.0000 mL | Freq: Two times a day (BID) | INTRAVENOUS | Status: DC

## 2015-08-07 MED ORDER — INSULIN GLARGINE 100 UNIT/ML ~~LOC~~ SOLN
30.0000 [IU] | Freq: Two times a day (BID) | SUBCUTANEOUS | Status: DC
  Administered 2015-08-07 – 2015-08-08 (×3): 30 [IU] via SUBCUTANEOUS
  Filled 2015-08-07 (×4): qty 0.3

## 2015-08-07 MED ORDER — HEPARIN SODIUM (PORCINE) 5000 UNIT/ML IJ SOLN
5000.0000 [IU] | Freq: Three times a day (TID) | INTRAMUSCULAR | Status: DC
  Administered 2015-08-07 – 2015-08-09 (×7): 5000 [IU] via SUBCUTANEOUS
  Filled 2015-08-07 (×7): qty 1

## 2015-08-07 MED ORDER — SODIUM CHLORIDE 0.9% FLUSH
10.0000 mL | INTRAVENOUS | Status: DC | PRN
Start: 2015-08-07 — End: 2015-08-09

## 2015-08-07 MED ORDER — SODIUM CHLORIDE 0.9% FLUSH
3.0000 mL | Freq: Two times a day (BID) | INTRAVENOUS | Status: DC
  Administered 2015-08-07 – 2015-08-09 (×6): 3 mL via INTRAVENOUS

## 2015-08-07 MED ORDER — DOXAZOSIN MESYLATE 2 MG PO TABS
2.0000 mg | ORAL_TABLET | Freq: Two times a day (BID) | ORAL | Status: DC
  Administered 2015-08-07 – 2015-08-09 (×5): 2 mg via ORAL
  Filled 2015-08-07 (×7): qty 1

## 2015-08-07 MED ORDER — INSULIN ASPART 100 UNIT/ML ~~LOC~~ SOLN
0.0000 [IU] | Freq: Three times a day (TID) | SUBCUTANEOUS | Status: DC
  Administered 2015-08-07 (×2): 5 [IU] via SUBCUTANEOUS
  Administered 2015-08-07: 2 [IU] via SUBCUTANEOUS
  Administered 2015-08-08: 5 [IU] via SUBCUTANEOUS
  Administered 2015-08-08 – 2015-08-09 (×3): 3 [IU] via SUBCUTANEOUS
  Administered 2015-08-09: 5 [IU] via SUBCUTANEOUS

## 2015-08-07 MED ORDER — ATORVASTATIN CALCIUM 40 MG PO TABS
40.0000 mg | ORAL_TABLET | Freq: Every day | ORAL | Status: DC
  Administered 2015-08-07 – 2015-08-09 (×3): 40 mg via ORAL
  Filled 2015-08-07 (×4): qty 1

## 2015-08-07 MED ORDER — CETYLPYRIDINIUM CHLORIDE 0.05 % MT LIQD
7.0000 mL | Freq: Two times a day (BID) | OROMUCOSAL | Status: DC
  Administered 2015-08-07 – 2015-08-09 (×5): 7 mL via OROMUCOSAL

## 2015-08-07 NOTE — Progress Notes (Signed)
Assess patient, pt has coarse crackles heard throughout, patient is SOB at rest, ICU staff move patient to bedside chair to help aid his breathing.

## 2015-08-07 NOTE — Progress Notes (Signed)
Family Medicine Teaching Service Daily Progress Note Intern Pager: [phone]  Patient name: ZAVIEN FRANCIONE Medical record number: [id number] Date of birth: [birth year] Age: 65 y.o. Gender: male  Primary Care Provider: [name], MD Consultants: None Code Status: Full Code  Pt Overview and Major Events to Date:  -Chest pain relieved with 3 nitroglycerin and 4 ASA -Trononin x3 negative -CXR showed interstitial edema -EKG unchanged from previous -Place on rebreather  Assessment and Plan:  # Substernal Chest Pain, resolved  Differential includes ACS (EKG unchanged, Troponin negative), CHF Exacerbation (Crackles, BNP 285.9), GERD (reported per patient), Musculoskeletal (not reproducible). Heart Score of 3.  - keep onTelemetry - Consider Cardiology consult ask about ARB/ACE or spironolactone - Acetaminophen 650mg  q6 prn - Aspirin 81 mg - Continued home Protonix  # Acute on Chronic Diastolic CHF with preserved LVEF with Grade 2 DD, exacerbation Patient has an EF of 60-65% and grade 2 diastolic dysfunction on most recent Echocardiogram from 06/2015, however has been having difficulty controlling volume status. - Reevaluate volume status, strict i/o with a 1 L goal for diuresis and daily weight - am BMP, CBC - Continue patient on IV lasix 100mg  once. Transition to oral diuretic when able. - Continue home Coreg 25mg  BID, Imdur 60mg   #COPD  COPD exacerbation unlikely given lack of cough, SOB most likely related to CHF exacerbation. -Continue albuterol 2 puff q6 prn and home Dulera -O2 to keep sats above 90%, Wean off as tolerated -Follow up with PCP to reassess need for home O2 - Consider low dose lung CT if no improvement given smoking history. Consider as outpatient for lung cancer screening at discharge.  # Hypertension, chronic Patient blood pressure has been elevated since admission ranging around from 175/71-204/94. Patient is currently on several drugs for a history of  resistant hypertension Will continue current home regimen and monitor BP. -Amlodipine 10 mg po once daily -Carvedilol 50mg  po BID -Doxazosin 2mg  po q12 -Hydralazine 100mg  po q8, Hydralazine 10mg  q4hr PRN for SBP >200 or DBP>110  # Hypokalemia Patient potassium was 3.3 on admission, patient has a history of CHF and has been on high dose of torsemide to control volume status, which would explain K wasting in addition to volume overload. 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 This is further evidence of patient deteriorating kidney function in the setting of hypertension, DM type II and diastolic CFH. Patient is currently being followed by nephrologist. - Reassess creatinine with morning BMP and better control of HTN and volume status - Anticipate small increase with Lasix, but hopeful for overall improvement once fluid status improves - Arrange for follow up with nephrologist recs regarding spironolactone or ARBs use.  #DM type II On admission patient glucose, was measured at 150 and rose to 227. Patient takes 65 units of lantus twice daily and 35 units of novolog 3 times daily. Patient po intake have decreased with hospitalization and insulin need will be readjusted in consequence. Last A1C 10.3 in 4/30,2017, next due on 09/02/15. - While admitted aspart 20 units 3 times with meals along with moderate Insulin Sliding Scale - Glargine 30 units, 2 times daily   #Hypothyroidism Last TSH in May 2017 was elevated to 12.559, should be repeated in the outpatient setting given his current condition, it is not a priority. Synthroid  recently started in 06/2015. Will continue home regimen. -Levothyroxine 19mcg po once daily  #Hyperlipidemia Well controlled, will continue home regimen. -Atorvastatin 40 mg once daily FEN/GI:heart healthy  carb modified diet PPx: Hearin flush  Disposition: Home   Subjective:  Overnight patient had some difficulty breathing while on venturi mask and was restless as well. Symptoms have improved with diuresis and better BP control. Patient feels tired this morning, but breathing has improved. Patient denies any chest pain, nausea, vomiting.   Objective: Temp:  [97.4 F (36.3 C)-98.8 F (37.1 C)] 98.7 F (37.1 C) (07/04 0740) Pulse Rate:  [68-86] 78 (07/04 0740) Resp:  [13-28] 20 (07/04 0740) BP: (144-220)/(62-94) 174/64 mmHg (07/04 0740) SpO2:  [87 %-100 %] 97 % (07/04 0740) Weight:  [237 lb (107.502 kg)-237 lb 7 oz (107.7 kg)] 237 lb 7 oz (107.7 kg) (07/04 0318)   Exam: General: Pleasant gentleman, able to answer questions and participate in exam  HEENT: Normocephalic atraumatic, PERRL, EOM intact Neck: , supple, no ymphadenopathy Cardiovascular: Regular rate and rythm, normal S1 and S2, no rubs gallops or murmur noted on exam, no JVP. Pulses hard to palpate in lower extremities. Respiratory: Crackles noted bilaterally in the upper and lower lung fields, no increase work of breathing Abdomen: tender, non distended soft, normal bowel sound MSK: +1 pitting edema noted on lower extremities up to his knee Skin: draining cysts noted on patient's back, warm Neuro: cranial nerves intact, +5 strength upper and lower extremities Psych: Alert and Oriented x4, Laboratory:  Recent Labs Lab 08/02/15 1020 08/06/15 1730 08/07/15 0334  WBC 10.9* 7.3 9.0  HGB 8.8* 8.3* 9.0*  HCT 28.6* 27.9* 29.6*  PLT 185 220 229    Recent Labs Lab 08/06/15 1730 08/07/15 0334  NA 137 139  K 3.3* 3.5  CL 103 105  CO2 25 24  BUN 43* 46*  CREATININE 3.16* 2.87*  CALCIUM 8.7* 8.6*  PROT 6.6  --   BILITOT 0.5  --   ALKPHOS 69  --   ALT 15*  --   AST 15  --   GLUCOSE 227* 248*   BNP: 285.9 Troponin: 0.01, 0.02 EKG: NSR signs of previous anteroseptal infarct and left atrium enlargement  Blood  gases pH: 7.448 pO2: 62.0 pCO2: 36.7 Bicarb: 25.4 O2 sat: 93%   Imaging/Diagnostic Tests: Patient is post median sternotomy. Improved lung volumes from prior exam. Cardiomegaly mediastinal contours are unchanged. Vascular congestion appears chronic. Increased mixed perihilar opacities suspicious for pulmonary edema. Minimal fluid in the fissures, no large subpulmonic effusion. No confluent airspace disease or pneumothorax. Osseous structures are unchanged  Marjie Skiff, MD 08/07/2015, 8:05 AM PGY-1 [place] Intern pager: [phone], text pages welcome

## 2015-08-07 NOTE — H&P (Signed)
[facility] Admission History and Physical Service Pager: [phone]  [name] Medical record number: [id number] Date of birth: 1950/10/19 Age: 65 y.o. Gender: male  Primary Care Provider: [name], MD Consultants: None Code Status: Full Code   Chief Complaint: Substernal Chest Pain  Assessment and Plan: KENTRAL SEPTER is a 65 y.o. male presenting with moderate substernal chest pain and new oxygen requirement most likely secondary to CHF exacerbation . 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  # Substernal Chest Pain, resolved Patient reported substernal chest pain rated 5/10 on 08/06/15. Patient reports that pain was non exertional, he took 3 nitroglycerin tablets without relief. Patient was subsequently given 4 aspirin tabs by EMS which provided relief. Patient has a history of chest pain with last episode in April 2017. Patient denies radiation of the pain and describes it as heartburn.  Onset of pain was non exertional which is consistent with unstable angina.EKG was unchanged from previous read and showed signs old infarct with no new findings. Because of patient history of CAD s/p CABGx3 (2015) patient troponin were monitored and were negative x2. Patient volume status has been difficult to control in the past few weeks, chest pain most likely related to hypertension and an increase in afterload in the setting of volume overload which most likely led to demand ischemia which would explained substernal chest pain experienced by patient. Differential includes ACS (EKG unchanged, Troponin negative), CHF Exacerbation (Crackles, BNP 285.9), GERD (reported per patient), Musculoskeletal (not reproducible). Heart Score of 3.  - Admitted to [facility], [place] attending - Place onTelemetry -  Continue trend troponin x1 - Consider Cardiology consult - Follow up EKG in the morning - Acetaminophen 650mg  q6 prn - Aspirin 81 mg - Continued home Protonix   # Acute on Chronic Diastolic CHF with preserved LVEF with Grade 2 DD, exacerbation Patient has an EF of 60-65% and grade 2 diastolic dysfunction on most recent Echocardiogram from 06/2015, however has been having difficulty controlling volume status. Patient is on torsemide 20 TID but is currently 10 lbs above his ideal weight of 230 lbs. On exam, patient has pitting edema bilaterally up to his knee. On pulmonary exam, crackles were noted bilaterally in the upper and lower lung fields. CXR in the ED showed evidence of  Increased interstitial edema consistent with current volume status. Patient was recently seen in clinic on 6/29 and torsemide dose was doubled to alleviate fluid overload but returned to his normal dose 3 days later. BNP was slightly elevated to 285.9 giving further evidence of volume overload status. - Reevaluate volume status, strict i/o with a 1 L goal for diuresis and daily weight - am BMP, CBC - Start patient on IV lasix 120mg  once. Consider further dosing pending urine output. Transition to oral diuretic when able. - Continue home Coreg 25mg  BID, Imdur 60mg   #COPD Patient was a previous smoker who stopped in 2015 with a history 46 pack a year.  Patient was on home oxygen 2L [place] but it was discontinued about 6 months ago. On admission to the ED patient O2 sat were 89 and he was placed on 4L  and transitioned to Non Rebreather mask with improvement of his oxygen saturation to mid 90%. Patient denies any shortness of breath during chest pain episode and subsequently as well. Patient has edema in his  lower extremities but most likely secondary to volume status, patient is not tachypneic making the diagnosis of PE very unlikely. Calculated Wells score was 0. CXR showed some evidence of interstitial edema which could explain  decrease O2 saturation. Blood gases were consistent with prior results. It appears that patient was at his baseline oxygen need on admission. COPD exacerbation unlikely given lack of cough. -Continue albuterol 2 puff q6 prn and home Dulera - O2 to keep sats above 90%, Wean off as tolerated -Follow up with PCP to reassess need for home O2 - Consider low dose lung CT if no improvement given smoking history. Consider as outpatient for lung cancer screening at discharge.  # Hypertension, chronic Patient blood pressure has been elevated since admission ranging around from 175/71-204/94. Patient is currently on several drugs for a history of resistant hypertension. History of Renal US in 06/2015 with left renal cortical irregularity, scarring cannot be excluded, otherwise unremarkable. Patient denies any headaches, blurry vision or other symptoms related to hypertensive urgency. Will continue current home regimen and monitor BP. -Amlodipine 10 mg po once daily -Carvedilol 50mg  po BID -Doxazosin 2mg  po q12 -Hydralazine 100mg  po q8, Hydralazine 10mg  q4hr PRN for SBP >200 or DBP>110  # Hypokalemia Patient potassium was 3.3 on admission, patient has a history of CHF and has been on high dose of torsemide to control volume status, which would explain K wasting in addition to volume overload. -Replete 64mEq po BID -Assess with morning BMP  # CKD III/IV Patient has a history of stage III/IV CKD, creatinine was 3.16 on admission doubling from 1.7 a year ago; Baseline over last year appears to be ~2.8. This is further evidence of patient deteriorating kidney function in the setting of hypertension, DM type II and diastolic CFH. Patient is currently being followed by nephrologist. - Reassess creatinine with morning BMP and better control of HTN and volume status - Anticipate small increase with Lasix, but hopeful for overall improvement once fluid status improves - Arrange for follow up with nephrologist  #DM  type II On admission patient glucose, was measured at 150 and rose to 227. Patient takes 65 units of lantus twice daily and 35 units of novolog 3 times daily. Patient po intake have decreased with hospitalization and insulin need will be readjusted in consequence. Last A1C 10.3 in 4/30,2017, next due on 09/02/15. - While admitted aspart 20 units 3 times with meals along with moderate Insulin Sliding Scale - Glargine 30 units, 2 times daily   #Hypothyroidism  Last TSH in May 2017 was elevated to 12.559, should be repeated in the outpatient setting given his current condition, it is not a priority. Synthroid recently started in 06/2015. Will continue home regimen. -Levothyroxine 29mcg po once daily  #Hyperlipidemia Well controlled, will continue home regimen. -Atorvastatin 40 mg once daily  FEN/GI: Replete K as needed, goal of 4. Heart healthy/ carb modified diet Prophylaxis: Heparin flush  Disposition: Home  History of Present Illness:  TOMI DERMAN is a 65 y.o. male with a past medical history significant for CAD s/p CABGx3 (2015), DM type II, Chronic Diastolic CHF with LVEF [id number] with grade 2 DD and normal RV, hypertension, HLD, hypothyroidism, CKD, and COPD who presenting with substernal chest pain and new oxygen requirement. Patient reports that pain started around 4:00 pm on 7/3 while watching TV. Patient rated pain 2/10 at the beginning of the episode but it gradually worsens to 5/10. Patient describes the pain as a burning sensation in his  chest which he thought consistent with acid reflux. Patient took 3 nitroglycerin tabs with minimal relief in pain and called EMS as previously instructed. Patient received 4 aspirin tabs when EMS arrived at his home, and pain resolved on his way to the ED. Patient denied any nausea, vomiting, diarrhea, diaphoresis, radiation fever or chills. Patient also denies any shortness of breath, PND and orthopnea. Patient had a similar episode in April 2017.  Patient has also noticed the swelling in his legs has been worsening for the past few days and admit to being 10 lbs above his ideal weight.  In ED patient O2 saturation were below 90% and he was started 4L [place] and transition to NRM. Troponins were negative x2 and CXR was also performed. EKG was consistent with previous results, showing old anteroseptal infart and possbile Left ventricle elargement.  Review Of Systems: Per HPI otherwise the remainder of the systems were negative.  Patient Active Problem List   Diagnosis Date Noted  . Iron deficiency anemia 08/03/2015  . Atherosclerosis of native arteries of extremity with intermittent claudication ([place]) 08/03/2015  . Weight gain 07/10/2015  . Hypertensive urgency   . Claudication ([place])   . CHF exacerbation ([place]) 06/28/2015  . Chronic obstructive pulmonary disease ([place])   . Type 2 diabetes mellitus with stage 3 chronic kidney disease, with long-term current use of insulin ([place])   . Hypertension, uncontrolled   . Hematuria 06/27/2015  . CKD (chronic kidney disease)   . CHF (congestive heart failure) ([place]) 06/18/2015  . Hypertensive emergency 06/18/2015  . Acute on chronic congestive heart failure ([place])   . Pain in the chest   . Renal failure (ARF), acute on chronic (HCC)   . Knee pain   . Renal failure   . Acute CHF (congestive heart failure) ([place]) 06/03/2015  . Chest pain 06/03/2015  . Skin lesion of face 02/22/2015  . 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 Anemia in chronic kidney disease 01/15/2015  . CKD (chronic kidney disease) stage 4, GFR 15-29 ml/min (HCC) 01/15/2015  . Hypothyroidism 11/22/2014  . Health care maintenance 11/22/2014  . Acute on chronic diastolic heart failure ([place]) 07/04/2014  . Tooth pain 05/11/2014  . Chronic diastolic heart failure ([place]) 03/16/2014  . COPD (chronic obstructive pulmonary disease) ([place]) 02/16/2014  . Diastolic heart failure ([place])   . Bilateral lower extremity  edema 10/14/2013  . History of tobacco use 08/23/2013  . 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 CAD (coronary artery disease) 04/06/2013  . NSTEMI (non-ST elevated myocardial infarction) ([place]) 04/05/2013  . PVD - hx of Rt SFA PTA and s/p Rt 4-5th toe amp Dec 2014 04/05/2013  . Atherosclerosis of native arteries of the extremities with gangrene ([place]) 02/15/2013  . Microcytic anemia 01/26/2013  . 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 DM (diabetes mellitus), type 2 with renal complications ([place]) [id number]  . Mixed hyperlipidemia 07/17/2008    Past Medical History: Past Medical History  Diagnosis Date  . Hypertension   . PAD (peripheral artery disease) ([place])   . Gangrene of toe ([place])     right 5th/notes 01/04/2013   . High cholesterol   . Type II diabetes mellitus ([place])   . 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 CHF (congestive heart failure) ([place])   . Shortness of breath dyspnea   . Iron deficiency anemia 08/03/2015    Past Surgical History: Past Surgical History  Procedure Laterality Date  . Throat surgery  1983  polyps  . Angioplasty / stenting femoral Right 01/13/2013    superficial femoral artery x 2 (3 mm x 60 mm, 4 mm x 60 mm)  . Tonsillectomy and adenoidectomy  ~ 1965  . Amputation Right 01/28/2013    Procedure: RAY AMPUTATION RIGHT  4th & 5th TOE;  Surgeon: Rosetta Posner, MD;  Location: [facility] OR;  Service: Vascular;  Laterality: Right;  . Coronary artery bypass graft N/A 04/07/2013    Procedure: CORONARY ARTERY BYPASS GRAFTING (CABG);  Surgeon: Ivin Poot, MD;  Location: [facility];  Service: Open Heart Surgery;  Laterality: N/A;  . Intraoperative transesophageal echocardiogram N/A 04/07/2013    Procedure: INTRAOPERATIVE TRANSESOPHAGEAL ECHOCARDIOGRAM;  Surgeon: Ivin Poot, MD;  Location: [facility];  Service: Open Heart Surgery;  Laterality: N/A;  . Lower extremity angiogram Bilateral 01/06/2013    Procedure: LOWER EXTREMITY ANGIOGRAM;  Surgeon: Conrad Sea Bright,  MD;  Location: [facility] CATH LAB;  Service: Cardiovascular;  Laterality: Bilateral;  . Lower extremity angiogram Right 01/13/2013    Procedure: LOWER EXTREMITY ANGIOGRAM;  Surgeon: Conrad Parsons, MD;  Location: [facility] CATH LAB;  Service: Cardiovascular;  Laterality: Right;  . Left heart catheterization with coronary angiogram N/A 04/05/2013    Procedure: LEFT HEART CATHETERIZATION WITH CORONARY ANGIOGRAM;  Surgeon: Peter M Martinique, MD;  Location: [facility] CATH LAB;  Service: Cardiovascular;  Laterality: N/A;  . Skin cancer excision  2017    Social History: Social History  Substance Use Topics  . Smoking status: Former Smoker -- 1.00 packs/day for 46 years    Types: Cigarettes    Start date: 02/04/1967    Quit date: 02/03/2013  . Smokeless tobacco: Never Used     Comment: Doing well with quitting.  . Alcohol Use: No   Additional social history: Patient currently live with sister Please also refer to relevant sections of EMR.  Family History: Family History  Problem Relation Age of Onset  . Cancer Mother     lung cancer  . Hypertension Mother   . Cancer Sister     patient thinks it was uterine cancer  . Hypertension Sister   . Heart attack Sister   . Stroke Neg Hx      Allergies and Medications: No Known Allergies No current facility-administered medications on file prior to encounter.   Current Outpatient Prescriptions on File Prior to Encounter  Medication Sig Dispense Refill  . aspirin EC 81 MG EC tablet Take 1 tablet (81 mg total) by mouth daily. 30 tablet 0  . acetaminophen (TYLENOL) 500 MG tablet Take 1 tablet (500 mg total) by mouth every 6 (six) hours as needed for mild pain or moderate pain. 30 tablet 0  . ADVAIR DISKUS 250-50 MCG/DOSE AEPB INHALE 1 PUFF INTO THE LUNGS 2 TIMES DAILY 60 each 1  . albuterol (PROVENTIL HFA;VENTOLIN HFA) 108 (90 Base) MCG/ACT inhaler Inhale 2 puffs into the lungs every 6 (six) hours as needed for wheezing or shortness of breath. 1 Inhaler 2  . amLODipine  (NORVASC) 10 MG tablet Take 1 tablet (10 mg total) by mouth daily. 90 tablet 1  . atorvastatin (LIPITOR) 40 MG tablet TAKE 1 TABLET BY MOUTH EVERY DAY AT 6 PM 90 tablet 1  . carvedilol (COREG) 25 MG tablet Take 2 tablets (50 mg total) by mouth 2 (two) times daily with a meal. 180 tablet 3  . doxazosin (CARDURA) 2 MG tablet Take 1 tablet (2 mg total) by mouth every 12 (twelve) hours. 60 tablet 0  .  hydrALAZINE (APRESOLINE) 100 MG tablet TAKE 1 TABLET (100MG ) BY MOUTH EVERY 8 HOURS 90 tablet 1  . insulin aspart (NOVOLOG FLEXPEN) 100 UNIT/ML FlexPen Inject 35 Units into the skin 3 (three) times daily with meals. 15 mL 6  . Insulin Glargine (LANTUS SOLOSTAR) 100 UNIT/ML Solostar Pen INJECT 65 UNITS INTO THE SKIN 2 TIMES DAILY 15 mL 2  . isosorbide mononitrate (IMDUR) 60 MG 24 hr tablet TAKE ONE (1) TABLET BY MOUTH EVERY DAY 30 tablet 3  . levothyroxine (SYNTHROID, LEVOTHROID) 50 MCG tablet Take 1 tablet (50 mcg total) by mouth daily before breakfast. 30 tablet 2  . nitroGLYCERIN (NITROSTAT) 0.4 MG SL tablet Place 1 tablet (0.4 mg total) under the tongue every 5 (five) minutes as needed for chest pain. 30 tablet 12  . pantoprazole (PROTONIX) 40 MG tablet Take 1 tablet (40 mg total) by mouth daily. 30 tablet 1  . SURE COMFORT PEN NEEDLES 31G X 8 MM MISC USE WITH BOTH INSULIN PENS TWICE DAILY (4 PER DAY) 100 each 5  . torsemide (DEMADEX) 20 MG tablet Take 3 tablets (60 mg total) by mouth daily. 90 tablet 1   Objective: BP 200/78 mmHg  Pulse 84  Temp(Src) 98.8 F (37.1 C) (Oral)  Resp 18  Ht 5\' 8"  (1.727 m)  Wt 237 lb (107.502 kg)  BMI 36.04 kg/m2  SpO2 98% Exam: General:  Pleasant gentleman, able to answer questions and participate in exam  HEENT: Normocephalic atraumatic, PERRL, EOM intact Neck: , supple, no ymphadenopathy Cardiovascular: Regular rate and rythm, normal S1 and S2, no rubs gallops or murmur noted on exam, no JVP. Pulses hard to palpate in lower extremities. Respiratory: Crackles  noted bilaterally in the upper and lower lung fields, no increase work of breathing Abdomen: tender, non distended soft, normal bowel sound MSK: +1 pitting edema noted on lower extremities up to his knee Skin: draining cysts noted on patient's back, warm Neuro: cranial nerves intact, +5 strength upper and lower extremities Psych: Alert and Oriented x4,  Labs and Imaging: CBC BMET   Recent Labs Lab 08/06/15 1730  WBC 7.3  HGB 8.3*  HCT 27.9*  PLT 220    Recent Labs Lab 08/06/15 1730  NA 137  K 3.3*  CL 103  CO2 25  BUN 43*  CREATININE 3.16*  GLUCOSE 227*  CALCIUM 8.7*    BNP: 285.9 Troponin: 0.01, 0.02 EKG: NSR signs of previous anteroseptal infarct and left atrium enlargement  Blood gases pH: 7.448 pO2: 62.0 pCO2: 36.7 Bicarb: 25.4 O2 sat: 93%  Imaging:  Patient is post median sternotomy. Improved lung volumes from prior exam. Cardiomegaly mediastinal contours are unchanged. Vascular congestion appears chronic. Increased mixed perihilar opacities suspicious for pulmonary edema. Minimal fluid in the fissures, no large subpulmonic effusion. No confluent airspace disease or pneumothorax. Osseous structures are unchanged  Dg Chest 2 View  08/06/2015  CLINICAL DATA:  Left-sided chest pain today.  Shortness of breath. EXAM: CHEST  2 VIEW COMPARISON:  07/03/2015 FINDINGS: Patient is post median sternotomy. Improved lung volumes from prior exam. Cardiomegaly mediastinal contours are unchanged. Vascular congestion appears chronic. Increased mixed perihilar opacities suspicious for pulmonary edema. Minimal fluid in the fissures, no large subpulmonic effusion. No confluent airspace disease or pneumothorax. Osseous structures are unchanged. IMPRESSION: 1. Increased interstitial edema from prior exam. 2. Chronic cardiomegaly and vascular congestion. Electronically Signed   By: Jeb Levering M.D.   On: 08/06/2015 21:20   Marjie Skiff, MD 08/07/2015, 12:19 AM PGY-1, [facility]  [place] Intern pager: [phone], text pages welcome  Upper Level Addendum:  I have seen and evaluated this patient along with Dr. Andy Gauss and reviewed the above note, making necessary revisions in blue.   Dr. Junie Panning, DO, PGY3 08/07/2015; 6:16 AM

## 2015-08-07 NOTE — Progress Notes (Signed)
At about 04:30 the patient became very restless and short of breath. The patients wheezing became worse and his lungs had crackles in the bases. The patient had to sit on the side of the bed to feel some relief. He became so restless he began taking his NRB off after he stated "I can't breathe". The NRB was replaced with a face tent at 15L to help with anxiety from the mask being on his face. The patient's BP remained high (203/93) even after 100mg  hydralazine PO at 03:34. Respiratory was called. MD paged and ordered patient's next dose 100mg  hydralazine PO to be given. By 5:30 the patient was moved to the chair and he began to feel better. Lungs still have wheezing but crackles have decreased. The patient is resting comfortably and BP is trending down (149/64). MD called back and was updated. Will continue to monitor.

## 2015-08-07 NOTE — ED Notes (Signed)
Dr. Lowella Dell notified on pt.'s elevated blood pressure and low O2 sat at 4 lpm/[place]. Pt. Placed back on a NRB mask.

## 2015-08-07 NOTE — ED Notes (Signed)
Pharmacist notified to send pt.'s Hydralazine for his hypertension .

## 2015-08-08 DIAGNOSIS — I10 Essential (primary) hypertension: Secondary | ICD-10-CM

## 2015-08-08 DIAGNOSIS — E785 Hyperlipidemia, unspecified: Secondary | ICD-10-CM

## 2015-08-08 DIAGNOSIS — I5032 Chronic diastolic (congestive) heart failure: Secondary | ICD-10-CM

## 2015-08-08 DIAGNOSIS — E1121 Type 2 diabetes mellitus with diabetic nephropathy: Secondary | ICD-10-CM

## 2015-08-08 DIAGNOSIS — R079 Chest pain, unspecified: Secondary | ICD-10-CM

## 2015-08-08 LAB — GLUCOSE, CAPILLARY
GLUCOSE-CAPILLARY: 157 mg/dL — AB (ref 65–99)
GLUCOSE-CAPILLARY: 72 mg/dL (ref 65–99)
Glucose-Capillary: 166 mg/dL — ABNORMAL HIGH (ref 65–99)
Glucose-Capillary: 204 mg/dL — ABNORMAL HIGH (ref 65–99)
Glucose-Capillary: 62 mg/dL — ABNORMAL LOW (ref 65–99)
Glucose-Capillary: 98 mg/dL (ref 65–99)
Glucose-Capillary: 151 mg/dL — ABNORMAL HIGH (ref 65–99)

## 2015-08-08 LAB — CBC
HEMATOCRIT: 28.5 % — AB (ref 39.0–52.0)
Hemoglobin: 8.5 g/dL — ABNORMAL LOW (ref 13.0–17.0)
MCH: 22.5 pg — AB (ref 26.0–34.0)
MCHC: 29.8 g/dL — ABNORMAL LOW (ref 30.0–36.0)
MCV: 75.6 fL — AB (ref 78.0–100.0)
Platelets: 242 10*3/uL (ref 150–400)
RBC: 3.77 MIL/uL — ABNORMAL LOW (ref 4.22–5.81)
RDW: 18.7 % — AB (ref 11.5–15.5)
WBC: 8 10*3/uL (ref 4.0–10.5)

## 2015-08-08 LAB — BASIC METABOLIC PANEL
ANION GAP: 10 (ref 5–15)
BUN: 44 mg/dL — AB (ref 6–20)
CALCIUM: 8.7 mg/dL — AB (ref 8.9–10.3)
CO2: 25 mmol/L (ref 22–32)
Chloride: 105 mmol/L (ref 101–111)
Creatinine, Ser: 2.6 mg/dL — ABNORMAL HIGH (ref 0.61–1.24)
GFR calc Af Amer: 28 mL/min — ABNORMAL LOW (ref >60)
GFR calc non Af Amer: 24 mL/min — ABNORMAL LOW (ref >60)
GLUCOSE: 118 mg/dL — AB (ref 65–99)
POTASSIUM: 3.7 mmol/L (ref 3.5–5.1)
Sodium: 140 mmol/L (ref 135–145)

## 2015-08-08 MED ORDER — INSULIN ASPART 100 UNIT/ML ~~LOC~~ SOLN
10.0000 [IU] | Freq: Three times a day (TID) | SUBCUTANEOUS | Status: DC
  Administered 2015-08-09 (×3): 10 [IU] via SUBCUTANEOUS

## 2015-08-08 MED ORDER — FUROSEMIDE 10 MG/ML IJ SOLN
100.0000 mg | Freq: Once | INTRAVENOUS | Status: AC
  Administered 2015-08-08: 100 mg via INTRAVENOUS
  Filled 2015-08-08: qty 10

## 2015-08-08 MED ORDER — INSULIN GLARGINE 100 UNIT/ML ~~LOC~~ SOLN
30.0000 [IU] | Freq: Every day | SUBCUTANEOUS | Status: DC
  Administered 2015-08-09: 30 [IU] via SUBCUTANEOUS
  Filled 2015-08-08: qty 0.3

## 2015-08-08 NOTE — Progress Notes (Signed)
Family Medicine Teaching Service Daily Progress Note Intern Pager: [phone]  [name] Medical record number: [id number] Date of birth: [birth year] Age: 66 y.o. Gender: male  Primary Care Provider: [name], MD Consultants: None Code Status: Full Code   Assessment and Plan: Leonard [name] is a 65 y.o. male presenting with moderate substernal chest pain and new oxygen requirement most likely secondary to CHF exacerbation . 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   # Substernal Chest Pain, resolved  Differential includes ACS (EKG unchanged, Troponin negative), CHF Exacerbation (Crackles, BNP 285.9), GERD (reported per patient), Musculoskeletal (not reproducible). Heart Score of 3.  - keep onTelemetry - Consider Cardiology consult ask about ARB/ACE or spironolactone - Acetaminophen 650mg  q6 prn - Aspirin 81 mg - Continued home Protonix  # Acute on Chronic Diastolic CHF with preserved LVEF with Grade 2 DD, exacerbation Patient has an EF of 60-65% and grade 2 diastolic dysfunction on most recent Echocardiogram from 06/2015, however has been having difficulty controlling volume status. Patient's urine output has improved with 3L in the past 24hrs. Patient still has mild crackles on lung exam and +1 pitting edema. Will continue diuresis with a target goal of 1-2L more in the next 24hrs.  -- Reevaluate volume status, strict i/o with a 1 L goal for diuresis and daily weight -- am BMP, CBC -- Continue patient on IV lasix 100mg  once, consider starting transition process to oral diuretic tomorrow - Continue home Coreg 25mg  BID, Imdur 60mg   #COPD  COPD exacerbation unlikely given lack of cough, SOB most likely related to CHF exacerbation. Patient was gradually wean off nasal canula overnight and maintain good sat, however this morning  patient was put back on 2L [place] and is maintaining good oxygenation. --Continue albuterol 2 puff q6 prn and home Dulera --O2 to keep sats above 90%, Wean off as tolerated --Follow up with PCP to reassess need for home O2 --Consider low dose lung CT if no improvement given smoking history. Consider as outpatient for lung cancer screening at discharge.  # Hypertension, chronic Patient blood pressure has been elevated since admission ranging around from 175/71-204/94. Patient is currently on several drugs for a history of resistant hypertension. Patient blood pressure continue to be elevated ranging for systolic in the A999333 and Diastolic XX123456. This morning BP has improved with last read 127/53. We will continue to monitor and assess patient mental status for change as we do not want to lower the BP much more than it is now. Will titrate BP meds to get patient closer to baseline BP. --Amlodipine 10 mg po once daily --Carvedilol 50mg  po BID --D/C Doxazosin 2mg  po q12 --Hydralazine 100mg  po q8, Hydralazine 10mg  q4hr PRN for SBP >200 or DBP>110  # Hypokalemia Patient potassium was 3.3 on admission, patient has a history of CHF and has been on high dose of torsemide to control volume status, which would explain K wasting in addition to volume overload. This morning potassium was measured at 3.7 up from 3.5 yesterday. -Assess with BMP and replete as needed    # CKD III/IV Patient has a history of stage III/IV CKD, creatinine was 3.16 on admission doubling from 1.7 a year ago; Baseline over last year appears to be ~2.8. This is further evidence of patient deteriorating kidney function in the setting of hypertension, DM type II and diastolic CFH. Patient creatinine  this morning is 2.60 (7/5) down from 2.87 yesterday (7/4). - Continue to monitor creatinine with morning BMP and better control of HTN and volume status - Arrange for follow up with nephrologist recs regarding spironolactone or ARBs use in  the setting of CKD III and his resistant hypertension.  #DM type II On admission patient glucose, was measured at 150 and rose to 227.  Last A1C 10.3 in 4/30 2017, next due on 09/02/15. Patient is currently on aspart 20 units 3 times with meals along with moderate Insulin Sliding Scale and Glargine 30 units, 2 times daily. Blood glucose seem to be well control, will continue current regimen.   #Hypothyroidism Last TSH in May 2017 was elevated to 12.559, should be repeated in the outpatient setting given his current condition, it is not a priority. Synthroid recently started in 06/2015. Will continue home regimen. -Levothyroxine 6mcg po once daily  #Hyperlipidemia Well controlled, will continue home regimen. -Atorvastatin 40 mg once daily  FEN/GI:heart healthy carb modified diet PPx: [place] flush  Disposition: Patient overall clinical status has improved, at this point patient might not need a high level of care and could be transfer from Stepdown to floor.   Subjective:  No acute event overnight, patient was comfortable, he reported being weaned off from [place] but said he was put back on [place] earlier this morning. Patient denies any nausea, vomiting, chest pain. Patient also report good urine output.  Objective: Temp:  [97.6 F (36.4 C)-98.6 F (37 C)] 98.6 F (37 C) (07/05 0800) Pulse Rate:  [66-87] 69 (07/05 0800) Resp:  [10-22] 13 (07/05 0800) BP: (127-183)/(53-107) 127/53 mmHg (07/05 0800) SpO2:  [88 %-100 %] 98 % (07/05 0800) Weight:  [232 lb 12.9 oz (105.6 kg)] 232 lb 12.9 oz (105.6 kg) (07/05 0402)   Exam: General: Pleasant gentleman, able to answer questions and participate in exam  HEENT: Normocephalic atraumatic, PERRL, EOM intact Neck: , supple, no ymphadenopathy Cardiovascular: Regular rate and rythm, normal S1 and S2, no rubs gallops or murmur noted on exam, no JVP. Pulses hard to palpate in lower extremities. Respiratory: Crackles noted bilaterally in the upper and  lower lung fields, no increase work of breathing Abdomen: tender, non distended soft, normal bowel sound MSK: +1 pitting edema noted on lower extremities up to his knee Skin: draining cysts noted on patient's back, warm Neuro: cranial nerves intact, +5 strength upper and lower extremities Psych: Alert and Oriented x4, Laboratory:  Recent Labs Lab 08/06/15 1730 08/07/15 0334 08/08/15 0257  WBC 7.3 9.0 8.0  HGB 8.3* 9.0* 8.5*  HCT 27.9* 29.6* 28.5*  PLT 220 229 242    Recent Labs Lab 08/06/15 1730 08/07/15 0334 08/08/15 0257  NA 137 139 140  K 3.3* 3.5 3.7  CL 103 105 105  CO2 25 24 25   BUN 43* 46* 44*  CREATININE 3.16* 2.87* 2.60*  CALCIUM 8.7* 8.6* 8.7*  PROT 6.6  --   --   BILITOT 0.5  --   --   ALKPHOS 69  --   --   ALT 15*  --   --   AST 15  --   --   GLUCOSE 227* 248* 118*    Imaging/Diagnostic Tests: Patient is post median sternotomy. Improved lung volumes from prior exam. Cardiomegaly mediastinal contours are unchanged. Vascular congestion appears chronic. Increased mixed perihilar opacities suspicious for pulmonary edema. Minimal fluid in the fissures, no large subpulmonic effusion. No confluent airspace disease or pneumothorax. Osseous structures are unchanged  Marjie Skiff, MD 08/08/2015, 8:59 AM PGY-1 [place] Intern pager: [phone], text pages welcome

## 2015-08-08 NOTE — Progress Notes (Signed)
Transferred to [facility] by wheelchair, stable. Report given to RN. Belongings with pt.

## 2015-08-08 NOTE — Progress Notes (Signed)
cbg-62 mg/dl asymptomatic, dinner served tolerated 70 % and oj 120 ml given. MD aware . with order. Repeat cbg- 72 mg/dl.

## 2015-08-08 NOTE — Progress Notes (Signed)
MD made aware about the cbg last night of 76 mg/dl,and claimed to adjust all the insulin ordered. Continue to monitor.

## 2015-08-09 ENCOUNTER — Ambulatory Visit: Payer: Medicaid Other | Admitting: Family Medicine

## 2015-08-09 LAB — BASIC METABOLIC PANEL
ANION GAP: 7 (ref 5–15)
BUN: 35 mg/dL — ABNORMAL HIGH (ref 6–20)
CALCIUM: 8.8 mg/dL — AB (ref 8.9–10.3)
CO2: 27 mmol/L (ref 22–32)
CREATININE: 2.48 mg/dL — AB (ref 0.61–1.24)
Chloride: 105 mmol/L (ref 101–111)
GFR, EST AFRICAN AMERICAN: 30 mL/min — AB (ref >60)
GFR, EST NON AFRICAN AMERICAN: 26 mL/min — AB (ref >60)
Glucose, Bld: 217 mg/dL — ABNORMAL HIGH (ref 65–99)
Potassium: 3.8 mmol/L (ref 3.5–5.1)
SODIUM: 139 mmol/L (ref 135–145)

## 2015-08-09 LAB — GLUCOSE, CAPILLARY
GLUCOSE-CAPILLARY: 179 mg/dL — AB (ref 65–99)
Glucose-Capillary: 164 mg/dL — ABNORMAL HIGH (ref 65–99)
Glucose-Capillary: 219 mg/dL — ABNORMAL HIGH (ref 65–99)

## 2015-08-09 MED ORDER — INSULIN GLARGINE 100 UNIT/ML SOLOSTAR PEN: 30.0000 [IU] | PEN_INJECTOR | Freq: Every day | SUBCUTANEOUS | Status: DC

## 2015-08-09 MED ORDER — ATORVASTATIN CALCIUM 40 MG PO TABS: ORAL_TABLET | ORAL | Status: DC

## 2015-08-09 MED ORDER — PANTOPRAZOLE SODIUM 40 MG PO TBEC: 40.0000 mg | DELAYED_RELEASE_TABLET | Freq: Every day | ORAL | Status: DC

## 2015-08-09 MED ORDER — INSULIN ASPART 100 UNIT/ML FLEXPEN: 35.0000 [IU] | PEN_INJECTOR | Freq: Three times a day (TID) | SUBCUTANEOUS | Status: DC

## 2015-08-09 MED ORDER — TORSEMIDE 20 MG PO TABS
60.0000 mg | ORAL_TABLET | Freq: Every day | ORAL | Status: DC
  Administered 2015-08-09: 60 mg via ORAL
  Filled 2015-08-09: qty 3

## 2015-08-09 MED ORDER — AMLODIPINE BESYLATE 10 MG PO TABS: 10.0000 mg | ORAL_TABLET | Freq: Every day | ORAL | Status: DC

## 2015-08-09 MED ORDER — CARVEDILOL 25 MG PO TABS: 50.0000 mg | ORAL_TABLET | Freq: Two times a day (BID) | ORAL | Status: DC

## 2015-08-09 NOTE — Discharge Instructions (Signed)
You were admitted with too much fluid in your lungs. It is very important that you take your medications exactly as prescribed.  Heart Failure Heart failure means your heart has trouble pumping blood. This makes it hard for your body to work well. Heart failure is usually a long-term (chronic) condition. You must take good care of yourself and follow your doctor's treatment plan. HOME CARE  Take your heart medicine as told by your doctor.  Do not stop taking medicine unless your doctor tells you to.  Do not skip any dose of medicine.  Refill your medicines before they run out.  Take other medicines only as told by your doctor or pharmacist.  Stay active if told by your doctor. The elderly and people with severe heart failure should talk with a doctor about physical activity.  Eat heart-healthy foods. Choose foods that are without trans fat and are low in saturated fat, cholesterol, and salt (sodium). This includes fresh or frozen fruits and vegetables, fish, lean meats, fat-free or low-fat dairy foods, whole grains, and high-fiber foods. Lentils and dried peas and beans (legumes) are also good choices.  Limit salt if told by your doctor.  Cook in a healthy way. Roast, grill, broil, bake, poach, steam, or stir-fry foods.  Limit fluids as told by your doctor.  Weigh yourself every morning. Do this after you pee (urinate) and before you eat breakfast. Write down your weight to give to your doctor.  Take your blood pressure and write it down if your doctor tells you to.  Ask your doctor how to check your pulse. Check your pulse as told.  Lose weight if told by your doctor.  Stop smoking or chewing tobacco. Do not use gum or patches that help you quit without your doctor's approval.  Schedule and go to doctor visits as told.  Nonpregnant women should have no more than 1 drink a day. Men should have no more than 2 drinks a day. Talk to your doctor about drinking alcohol.  Stop  illegal drug use.  Stay current with shots (immunizations).  Manage your health conditions as told by your doctor.  Learn to manage your stress.  Rest when you are tired.  If it is really hot outside:  Avoid intense activities.  Use air conditioning or fans, or get in a cooler place.  Avoid caffeine and alcohol.  Wear loose-fitting, lightweight, and light-colored clothing.  If it is really cold outside:  Avoid intense activities.  Layer your clothing.  Wear mittens or gloves, a hat, and a scarf when going outside.  Avoid alcohol.  Learn about heart failure and get support as needed.  Get help to maintain or improve your quality of life and your ability to care for yourself as needed. GET HELP IF:   You gain weight quickly.  You are more short of breath than usual.  You cannot do your normal activities.  You tire easily.  You cough more than normal, especially with activity.  You have any or more puffiness (swelling) in areas such as your hands, feet, ankles, or belly (abdomen).  You cannot sleep because it is hard to breathe.  You feel like your heart is beating fast (palpitations).  You get dizzy or light-headed when you stand up. GET HELP RIGHT AWAY IF:   You have trouble breathing.  There is a change in mental status, such as becoming less alert or not being able to focus.  You have chest pain or discomfort.  You faint. MAKE SURE YOU:   Understand these instructions.  Will watch your condition.  Will get help right away if you are not doing well or get worse.   This information is not intended to replace advice given to you by your health care provider. Make sure you discuss any questions you have with your health care provider.   Document Released: 10/30/2007 Document Revised: 02/10/2014 Document Reviewed: 03/08/2012 Elsevier Interactive Patient Education [employer].

## 2015-08-09 NOTE — Care Management Note (Signed)
Case Management Note  Patient Details  Name: DAMEIN BATTIEST MRN: [id number] Date of Birth: 1950-07-17  Subjective/Objective:   CHF, DM, COPD                 Action/Plan: Discharge Planning: AVS reviewed:  NCM spoke to pt at bedside. States he had [facility] in the past for [facility]. Has RW and cane at home. Oxygen sats 93% while walking per PT notes. Faxed referral to [facility] for [facility] RN.   PCP - [name]  MD  Expected Discharge Date:  08/09/2015               Expected Discharge Plan:  [place]  In-House Referral:  NA  Discharge planning Services  CM Consult  Post Acute Care Choice:  Home Health Choice offered to:  Patient  DME Arranged:   (has RW and cane at home) DME Agency:  NA  HH Arranged:  RN [place] Agency:  [place]  Status of Service:  Completed, signed off  If discussed at [place] of Stay Meetings, dates discussed:    Additional Comments:  Erenest Rasher, RN 08/09/2015, 5:51 PM

## 2015-08-09 NOTE — Progress Notes (Signed)
Family Medicine Teaching Service Daily Progress Note Intern Pager: [phone]  [name] Medical record number: [id number] Date of birth: Feb 12, 1950 Age: 65 y.o. Gender: male  Primary Care Provider: [name], MD Consultants: None Code Status: Full Code   Assessment and Plan: Leonard [name] is a 65 y.o. male presenting with moderate substernal chest pain and new oxygen requirement most likely secondary to CHF exacerbation . 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   # Substernal Chest Pain, resolved  Differential includes ACS (EKG unchanged, Troponin negative), CHF Exacerbation (Crackles, BNP 285.9), GERD (reported per patient), Musculoskeletal (not reproducible). Heart Score of 3.   - Consider Cardiology consult ask about ARB/ACE or spironolactone - Acetaminophen 650mg  q6 prn - Aspirin 81 mg - Continued home Protonix  # Acute on Chronic Diastolic CHF with preserved LVEF with Grade 2 DD, exacerbation Patient has an EF of 60-65% and grade 2 diastolic dysfunction on most recent Echocardiogram from 06/2015, however has been having difficulty controlling volume status. Patient's urine output has improved with 4.5L in the past 48hrs. Patient still has minimal edema in his LE and no crackles were discerned on lung exam. Will transition patient to po lasix. Patient weight this morning is 226 dow from 232 on admission. -- Reevaluate volume status, strict i/o with a 1 L goal for diuresis and daily weight -- am BMP, CBC -- Transition patient to po lasix this morning in anticipation for discharge  -- Continue home Coreg 25mg  BID, Imdur 60mg   #COPD  COPD exacerbation unlikely given lack of cough, SOB most likely related to CHF exacerbation. Patient has been wean off [place] and is maintaining sat in the mid 90%. --Continue albuterol 2 puff q6  prn and home Dulera --O2 to keep sats above 90%, Wean off as tolerated --Follow up with PCP to reassess need for home O2 --Consider low dose lung CT if no improvement given smoking history. Consider as outpatient for lung cancer screening at discharge.  # Hypertension, chronic Patient blood pressure has been elevated since admission ranging around from 175/71-204/94. Patient is currently on several drugs for a history of resistant hypertension. Patient blood pressure continue to be elevated ranging for systolic in the A999333 and Diastolic XX123456. This morning BP was 163/52. We will continue to monitor and assess patient mental status for change as we do not want to lower the BP much more than it is now.  --Continue Amlodipine 10 mg po once daily --Continue Carvedilol 50mg  po BID --Continue Doxazosin 2mg  po q12 --Continue Hydralazine 100mg  po q8, Hydralazine 10mg  q4hr PRN for SBP >200 or DBP>110  # Hypokalemia Patient potassium was 3.3 on admission, patient has a history of CHF and has been on high dose of torsemide to control volume status, which would explain K wasting in addition to volume overload. This morning potassium was measured at 3.8 up from 3.7 yesterday. --replete prior to discharge   # CKD III/IV Patient has a history of stage III/IV CKD, creatinine was 3.16 on admission doubling from 1.7 a year ago; Baseline over last year appears to be ~2.8. This is further evidence of patient deteriorating kidney function in the setting of hypertension, DM type II and diastolic CFH. Patient creatinine this morning is 2.48 down from 2.60 yesterday.  - Continue to monitor creatinine with morning BMP and better control of HTN and volume status - Arrange  for follow up with nephrologist recs regarding spironolactone or ARBs use in the setting of CKD III and his resistant hypertension.  #DM type II On admission patient glucose, was measured at 150 and rose to 227.  Last A1C 10.3 in 4/30 2017, next  due on 09/02/15. On admission, patient was started on aspart 20 units 3 times with meals along with moderate Insulin Sliding Scale. Patient blood glucose continue to be low. Patient regimen will be modify to reflect new baseline since hospitalization.  --Novolog 15 units 3 times a day with meals  #Hypothyroidism Last TSH in May 2017 was elevated to 12.559, should be repeated in the outpatient setting given his current condition, it is not a priority. Synthroid recently started in 06/2015. Will continue home regimen. -Levothyroxine 22mcg po once daily  #Hyperlipidemia Well controlled, will continue home regimen. -Atorvastatin 40 mg once daily  FEN/GI:heart healthy carb modified diet PPx: Heparin flush  Disposition: Patient overall clinical status has improved, and stable enough for discharge.   Subjective:   Patient feeling better this morning, denies chest pain, nausea. Patient is ambulating and denies SOB.  Objective: Temp:  [97.3 F (36.3 C)-99 F (37.2 C)] 98 F (36.7 C) (07/06 0415) Pulse Rate:  [64-71] 71 (07/06 0415) Resp:  [14-20] 18 (07/06 0415) BP: (151-164)/(52-71) 163/52 mmHg (07/06 0649) SpO2:  [93 %-98 %] 94 % (07/06 0858) Weight:  [226 lb 1.6 oz (102.558 kg)] 226 lb 1.6 oz (102.558 kg) (07/06 0415)   Exam: General: Pleasant gentleman, able to answer questions and participate in exam  HEENT: Normocephalic atraumatic, PERRL, EOM intact Neck: , supple, no ymphadenopathy Cardiovascular: Regular rate and rythm, normal S1 and S2, no rubs gallops or murmur noted on exam, no JVP. Pulses hard to palpate in lower extremities. Respiratory: Mild crackles noted bilaterally in the upper and lower lung fields, no increase work of breathing Abdomen: tender, non distended soft, normal bowel sound MSK: Minimal pitting edema noted on lower extremities up to his knee Skin: draining cysts noted on patient's back, warm Neuro: cranial nerves intact, +5 strength upper and lower  extremities Psych: Alert and Oriented x4, Laboratory:  Recent Labs Lab 08/06/15 1730 08/07/15 0334 08/08/15 0257  WBC 7.3 9.0 8.0  HGB 8.3* 9.0* 8.5*  HCT 27.9* 29.6* 28.5*  PLT 220 229 242    Recent Labs Lab 08/06/15 1730 08/07/15 0334 08/08/15 0257  NA 137 139 140  K 3.3* 3.5 3.7  CL 103 105 105  CO2 25 24 25   BUN 43* 46* 44*  CREATININE 3.16* 2.87* 2.60*  CALCIUM 8.7* 8.6* 8.7*  PROT 6.6  --   --   BILITOT 0.5  --   --   ALKPHOS 69  --   --   ALT 15*  --   --   AST 15  --   --   GLUCOSE 227* 248* 118*    Imaging/Diagnostic Tests: Patient is post median sternotomy. Improved lung volumes from prior exam. Cardiomegaly mediastinal contours are unchanged. Vascular congestion appears chronic. Increased mixed perihilar opacities suspicious for pulmonary edema. Minimal fluid in the fissures, no large subpulmonic effusion. No confluent airspace disease or pneumothorax. Osseous structures are unchanged  Marjie Skiff, MD 08/09/2015, 9:37 AM PGY-1 [place] Intern pager: [phone], text pages welcome

## 2015-08-09 NOTE — Evaluation (Signed)
Physical Therapy Evaluation Patient Details Name: Leonard [name] MRN: [id number] DOB: 1950/03/21 Today's Date: 08/09/2015   History of Present Illness  65 yo M with history of CKD, CAD, diastolic CHF, type 2 diabetes, hyperlipidemia, PVD, COPD, CABG x 3 2015,  hypertension admitted with chest pain, hypoxia. Dx of CHF exacerbation.   Clinical Impression  Pt is modified independent with mobility, he ambulated 280' with RW, SaO2 93% on RA while walking. Encouraged pt to take frequent, short walks at home to increase endurance. From PT standpoint he's ready to DC home. No further PT indicated.     Follow Up Recommendations No PT follow up    Equipment Recommendations  None recommended by PT    Recommendations for Other Services       Precautions / Restrictions Precautions Precautions: None Restrictions Weight Bearing Restrictions: No      Mobility  Bed Mobility Overal bed mobility: Independent                Transfers Overall transfer level: Independent                  Ambulation/Gait Ambulation/Gait assistance: Modified independent (Device/Increase time) Ambulation Distance (Feet): 280 Feet Assistive device: Rolling walker (2 wheeled) Gait Pattern/deviations: WFL(Within Functional Limits)   Gait velocity interpretation: at or above normal speed for age/gender General Gait Details: steady with RW, no LOB, SaO2 93-96% on RA while walking  Stairs            Wheelchair Mobility    Modified Rankin (Stroke Patients Only)       Balance Overall balance assessment: Modified Independent                                           Pertinent Vitals/Pain Pain Assessment: No/denies pain    Home Living Family/patient expects to be discharged to:: Private residence Living Arrangements: Other relatives (with sister) Available Help at Discharge: Family;Available 24 hours/day Type of Home: Apartment Home Access: Level entry     Home  Layout: One level Home Equipment: Clinical cytogeneticist - 2 wheels;Adaptive equipment;Cane - quad      Prior Function Level of Independence: Needs assistance   Gait / Transfers Assistance Needed: Pt walks with walker indoors and outdoors.  Fatigues quickly.   ADL's / Homemaking Assistance Needed: Pt has aid 3x week to assist with shower.  Pt is able to assist with bathing but fatigues so quickly with bathing and dressing that often times cannot complete the tasks before lunch time.        Hand Dominance   Dominant Hand: Right    Extremity/Trunk Assessment   Upper Extremity Assessment: Overall WFL for tasks assessed           Lower Extremity Assessment: Overall WFL for tasks assessed      Cervical / Trunk Assessment: Normal  Communication   Communication: No difficulties  Cognition Arousal/Alertness: Awake/alert Behavior During Therapy: WFL for tasks assessed/performed Overall Cognitive Status: Within Functional Limits for tasks assessed                      General Comments      Exercises        Assessment/Plan    PT Assessment    PT Diagnosis Difficulty walking   PT Problem List    PT Treatment Interventions     PT  Goals (Current goals can be found in the Care Plan section) Acute Rehab PT Goals PT Goal Formulation: All assessment and education complete, DC therapy    Frequency     Barriers to discharge        Co-evaluation               End of Session Equipment Utilized During Treatment: Gait belt Activity Tolerance: Patient tolerated treatment well Patient left: in bed;with call bell/phone within reach           Time: 1232-1245 PT Time Calculation (min) (ACUTE ONLY): 13 min   Charges:   PT Evaluation $PT Eval Low Complexity: 1 Procedure     PT G CodesPhilomena Doheny 08/09/2015, 12:53 PM [phone]

## 2015-08-10 ENCOUNTER — Ambulatory Visit: Payer: Medicaid Other

## 2015-08-11 NOTE — Discharge Summary (Signed)
[facility] Discharge Summary  [name] Medical record number: [id number] Date of birth: [birth year] Age: 65 y.o. Gender: male Date of Admission: 08/06/2015  Date of Discharge: 08/09/15 Admitting Physician: Leonard Covert, MD  Primary Care Provider: [name], MD Consultants: None   Indication for Hospitalization: Acute onset of substernal chest pain  Discharge Diagnoses/Problem List:  CHF exacerbation  Disposition: Home with Home Health Services   Discharge Condition: Stable  Discharge Exam:  General: Pleasant gentleman, able to answer questions and participate in exam  HEENT: Normocephalic atraumatic, PERRL, EOM intact Neck: , supple, no ymphadenopathy Cardiovascular: Regular rate and rythm, normal S1 and S2, no rubs gallops or murmur noted on exam, no JVP. Pulses hard to palpate in lower extremities. Respiratory: Mild crackles noted bilaterally in the upper and lower lung fields, no increase work of breathing Abdomen: tender, non distended soft, normal bowel sound MSK: Minimal pitting edema noted on lower extremities up to his knee Skin: draining cysts noted on patient's back, warm Neuro: cranial nerves intact, +5 strength upper and lower extremities Psych: Alert and Oriented x4  Brief Hospital Course:  Leonard [name] is a 65 year old with a past medical DM type II,Diastolic CHF with preserved EF, CKD III/IV, hypothyroidism, resistant hypertension, PVD s/p right 4th and 5th digit amputation, COPD who was admitted for substernal chest pain. On admission, troponin were trended and were not elevated and there were no change on EKG from previous reads effectively ruling out a cardiac process. however CXR showed signs of interstitial edema consistent with physical exam findings. Patient had crackles on exam and severe pitting edema. Patient also had increase shortness of breath and was started on oxygent. Patient was admitted,  and started on aggressive diuresis regimen with IV Lasix. In the next 48 hours patient is -4.5L with improvement in edema, improving respiratory status. Patient insulin regimen was titrated for optimal control. Patient was completely wean off oxygen prior to discharge and maintain adequate oxygen saturation. Creatinine was monitor throughout hospitalization and was at baseline despite aggressive diuresis. Patient was transitioned to po furosemide prior to discharge with clear instructions to follow up with PCP and nephrologist for management of his CHF, CKD and DM II.   Issues for Follow Up:  1. Please address medication adherence, patient with complex medical history on many medications. Simplification of current treatment regimen could be warranted. Better control of CHF and DM type II would minimize future hospitalizations and improve quality of life.  2. Follow up with nephrologist, CHF medication (Furosemide dosage) should be discuss in the setting of CKD III/IV.   Significant Procedures: None  Significant Labs and Imaging:   Recent Labs Lab 08/06/15 1730 08/07/15 0334 08/08/15 0257  WBC 7.3 9.0 8.0  HGB 8.3* 9.0* 8.5*  HCT 27.9* 29.6* 28.5*  PLT 220 229 242    Recent Labs Lab 08/06/15 1730 08/07/15 0334 08/08/15 0257 08/09/15 1003  NA 137 139 140 139  K 3.3* 3.5 3.7 3.8  CL 103 105 105 105  CO2 25 24 25 27   GLUCOSE 227* 248* 118* 217*  BUN 43* 46* 44* 35*  CREATININE 3.16* 2.87* 2.60* 2.48*  CALCIUM 8.7* 8.6* 8.7* 8.8*  ALKPHOS 69  --   --   --   AST 15  --   --   --   ALT 15*  --   --   --   ALBUMIN 3.2*  --   --   --  Imaging:  Patient is post median sternotomy. Improved lung volumes from prior exam. Cardiomegaly mediastinal contours are unchanged. Vascular congestion appears chronic. Increased mixed perihilar opacities suspicious for pulmonary edema. Minimal fluid in the fissures, no large subpulmonic effusion. No confluent airspace disease  or pneumothorax. Osseous structures are unchanged.  Results/Tests Pending at Time of Discharge: None  Discharge Medications:    Medication List    TAKE these medications        acetaminophen 500 MG tablet  Commonly known as:  TYLENOL  Take 1 tablet (500 mg total) by mouth every 6 (six) hours as needed for mild pain or moderate pain.     ADVAIR DISKUS 250-50 MCG/DOSE Aepb  Generic drug:  Fluticasone-Salmeterol  INHALE 1 PUFF INTO THE LUNGS 2 TIMES DAILY     albuterol 108 (90 Base) MCG/ACT inhaler  Commonly known as:  PROVENTIL HFA;VENTOLIN HFA  Inhale 2 puffs into the lungs every 6 (six) hours as needed for wheezing or shortness of breath.     amLODipine 10 MG tablet  Commonly known as:  NORVASC  Take 1 tablet (10 mg total) by mouth daily.     aspirin 81 MG EC tablet  Take 1 tablet (81 mg total) by mouth daily.     atorvastatin 40 MG tablet  Commonly known as:  LIPITOR  TAKE 1 TABLET BY MOUTH EVERY DAY AT 6 PM     carvedilol 25 MG tablet  Commonly known as:  COREG  Take 2 tablets (50 mg total) by mouth 2 (two) times daily with a meal.     doxazosin 2 MG tablet  Commonly known as:  CARDURA  Take 1 tablet (2 mg total) by mouth every 12 (twelve) hours.     furosemide 40 MG tablet  Commonly known as:  LASIX  Take 80 mg by mouth 2 (two) times daily.     hydrALAZINE 100 MG tablet  Commonly known as:  APRESOLINE  TAKE 1 TABLET (100MG ) BY MOUTH EVERY 8 HOURS     insulin aspart 100 UNIT/ML FlexPen  Commonly known as:  NOVOLOG FLEXPEN  Inject 35 Units into the skin 3 (three) times daily with meals.     Insulin Glargine 100 UNIT/ML Solostar Pen  Commonly known as:  LANTUS SOLOSTAR  Inject 30 Units into the skin daily.     isosorbide mononitrate 60 MG 24 hr tablet  Commonly known as:  IMDUR  TAKE ONE (1) TABLET BY MOUTH EVERY DAY     levothyroxine 50 MCG tablet  Commonly known as:  SYNTHROID, LEVOTHROID  Take 1 tablet (50 mcg total) by mouth daily before breakfast.      nitroGLYCERIN 0.4 MG SL tablet  Commonly known as:  NITROSTAT  Place 1 tablet (0.4 mg total) under the tongue every 5 (five) minutes as needed for chest pain.     pantoprazole 40 MG tablet  Commonly known as:  PROTONIX  Take 1 tablet (40 mg total) by mouth daily.     SURE COMFORT PEN NEEDLES 31G X 8 MM Misc  Generic drug:  Insulin Pen Needle  USE WITH BOTH INSULIN PENS TWICE DAILY (4 PER DAY)     torsemide 20 MG tablet  Commonly known as:  DEMADEX  Take 3 tablets (60 mg total) by mouth daily.        Discharge Instructions: Please refer to Patient Instructions section of EMR for full details.  Patient was counseled important signs and symptoms that should prompt return to medical care,  changes in medications, dietary instructions, activity restrictions, and follow up appointments.   Follow-Up Appointments:     Follow-up Information    Follow up with Melina Schools, DO On 08/15/2015.   Specialty:  Family Medicine   Why:  8:45am   Contact information:   [place] [phone]       Follow up with [facility].   Why:  Home Health RN   Contact information:   [place] [phone]       Marjie Skiff, MD 08/11/2015, 9:49 PM PGY-1, [place]

## 2015-08-15 ENCOUNTER — Ambulatory Visit (INDEPENDENT_AMBULATORY_CARE_PROVIDER_SITE_OTHER): Payer: Medicaid Other | Admitting: Internal Medicine

## 2015-08-15 ENCOUNTER — Other Ambulatory Visit: Payer: Self-pay | Admitting: Internal Medicine

## 2015-08-15 ENCOUNTER — Encounter: Payer: Self-pay | Admitting: Internal Medicine

## 2015-08-15 VITALS — BP 158/62 | HR 68 | Temp 98.2°F | Ht 68.0 in | Wt 232.2 lb

## 2015-08-15 DIAGNOSIS — I509 Heart failure, unspecified: Secondary | ICD-10-CM | POA: Diagnosis not present

## 2015-08-15 MED ORDER — FUROSEMIDE 80 MG PO TABS: 80.0000 mg | ORAL_TABLET | Freq: Two times a day (BID) | ORAL | Status: DC

## 2015-08-15 MED ORDER — FUROSEMIDE 80 MG PO TABS: 80.0000 mg | ORAL_TABLET | Freq: Every day | ORAL | Status: DC

## 2015-08-15 NOTE — Progress Notes (Signed)
Subjective:    Leonard [name] - 65 y.o. male MRN [id number]  Date of birth: 24-Feb-1950  HPI  Leonard [name] is here for hospital f/u for CHF exacerbation. He was treated in hospital with aggressive diuresis with IV lasix. During hospitalization, he did require supplemental O2. However he was weaned off O2 prior to d/c and able to maintain appropriate O2 saturations. He was discharged with PO Torsemide 60 mg daily.   Today, he reports that he fells well. Denies CP, increased SOB above his baseline, and increased edema.   There is some confusion about medications. Wife and him report that he is taking Torsemide three pills daily (60 mg). Their medication discharge list also says he should be taking Lasix but they do not have this at home. Asking which one he should be taking or if he should be taking both.   They report being unable to schedule a nephrology appointment. Say they waited on hold multiple times, left message and didn't hear back, etc.    -  reports that he quit smoking about 2 years ago. His smoking use included Cigarettes. He started smoking about 48 years ago. He has a 46 pack-year smoking history. He has never used smokeless tobacco. - Review of Systems: Per HPI. - Past Medical History: Patient Active Problem List   Diagnosis Date Noted  . Acute exacerbation of congestive heart failure ([place]) 08/07/2015  . Iron deficiency anemia 08/03/2015  . Atherosclerosis of native arteries of extremity with intermittent claudication ([place]) 08/03/2015  . Weight gain 07/10/2015  . Hypertensive urgency   . Claudication ([place])   . CHF exacerbation ([place]) 06/28/2015  . Chronic obstructive pulmonary disease ([place])   . Type 2 diabetes mellitus with stage 3 chronic kidney disease, with long-term current use of insulin ([place])   . Hypertension, uncontrolled   . Hematuria 06/27/2015  . CKD (chronic kidney disease)   . CHF (congestive heart failure) ([place]) 06/18/2015  . Hypertensive emergency  06/18/2015  . Acute on chronic congestive heart failure ([place])   . Pain in the chest   . Renal failure (ARF), acute on chronic (HCC)   . Knee pain   . Renal failure   . Acute CHF (congestive heart failure) ([place]) 06/03/2015  . Chest pain 06/03/2015  . Skin lesion of face 02/22/2015  . 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 Anemia in chronic kidney disease 01/15/2015  . CKD (chronic kidney disease) stage 4, GFR 15-29 ml/min (HCC) 01/15/2015  . Hypothyroidism 11/22/2014  . Health care maintenance 11/22/2014  . Acute on chronic diastolic heart failure ([place]) 07/04/2014  . Tooth pain 05/11/2014  . Chronic diastolic heart failure ([place]) 03/16/2014  . COPD (chronic obstructive pulmonary disease) ([place]) 02/16/2014  . Diastolic heart failure ([place])   . Bilateral lower extremity edema 10/14/2013  . History of tobacco use 08/23/2013  . 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 CAD (coronary artery disease) 04/06/2013  . NSTEMI (non-ST elevated myocardial infarction) ([place]) 04/05/2013  . PVD - hx of Rt SFA PTA and s/p Rt 4-5th toe amp Dec 2014 04/05/2013  . Atherosclerosis of native arteries of the extremities with gangrene ([place]) 02/15/2013  . Microcytic anemia 01/26/2013  . 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 DM (diabetes mellitus), type 2 with renal complications ([place]) [id number]  . Mixed hyperlipidemia 07/17/2008   - Medications: reviewed and updated    Objective:   Physical Exam BP 158/62 mmHg  Pulse 68  Temp(Src) 98.2 F (36.8 C) (  Oral)  Ht 5\' 8"  (1.727 m)  Wt 232 lb 3.2 oz (105.325 kg)  BMI 35.31 kg/m2  SpO2 99% Gen: NAD, alert, cooperative with exam, well-appearing CV: RRR, good S1/S2, no murmur, no edema, capillary refill brisk  Resp: CTABL, no crackles appreciated, no increased WOB  Skin: trace-1+ pitting edema to shins     Assessment & Plan:   CHF (congestive heart failure) (HCC) Does not appear to be particularly volume overloaded at today's visit. Is up in  weight from hospital discharge although appears to have been discharged several lbs below his dry weight of approximately 230 lbs. Discussed with inpatient team regarding intended diuretic regimen at discharge. Inpatient team confirmed that he should be taking Lasix 80 mg BID instead of Torsemide 60 mg daily. Suspect that the increase in diuretic today will help get him down to ideal weight. Patient is a high risk for readmission so want to f/u closely. Attempted to reach [facility] to schedule patient f/u regarding diuretic dose in the setting of CKD.  -stop torsemide, start Lasix 80 mg BID  -strict return precautions discussed  -return on Monday 7/17 for follow up, will likely need BMET at that visit       Phill Myron, D.O. 08/15/2015, 11:05 AM PGY-2, [place]

## 2015-08-15 NOTE — Patient Instructions (Signed)
STOP taking Torsemide. Start taking Furosemide (Lasix) 80 mg twice per day.   If you start having a lot more swelling in your legs or increased difficulty breathing you need to be seen sooner.   Please make an appointment for Monday so that we can monitor you closely.

## 2015-08-15 NOTE — Assessment & Plan Note (Addendum)
Does not appear to be particularly volume overloaded at today's visit. Is up in weight from hospital discharge although appears to have been discharged several lbs below his dry weight of approximately 230 lbs. Discussed with inpatient team regarding intended diuretic regimen at discharge. Inpatient team confirmed that he should be taking Lasix 80 mg BID instead of Torsemide 60 mg daily. Suspect that the increase in diuretic today will help get him down to ideal weight. Patient is a high risk for readmission so want to f/u closely. Attempted to reach [facility] to schedule patient f/u regarding diuretic dose in the setting of CKD.  -stop torsemide, start Lasix 80 mg BID  -strict return precautions discussed  -return on Monday 7/17 for follow up, will likely need BMET at that visit

## 2015-08-16 ENCOUNTER — Other Ambulatory Visit (HOSPITAL_BASED_OUTPATIENT_CLINIC_OR_DEPARTMENT_OTHER): Payer: Medicaid Other

## 2015-08-16 ENCOUNTER — Ambulatory Visit: Payer: Medicaid Other

## 2015-08-16 ENCOUNTER — Ambulatory Visit (HOSPITAL_BASED_OUTPATIENT_CLINIC_OR_DEPARTMENT_OTHER): Payer: Medicaid Other

## 2015-08-16 ENCOUNTER — Ambulatory Visit (HOSPITAL_COMMUNITY): Payer: Medicaid Other

## 2015-08-16 ENCOUNTER — Other Ambulatory Visit: Payer: Medicaid Other

## 2015-08-16 VITALS — BP 144/56 | HR 68 | Temp 98.2°F | Resp 16

## 2015-08-16 DIAGNOSIS — D631 Anemia in chronic kidney disease: Secondary | ICD-10-CM

## 2015-08-16 DIAGNOSIS — N184 Chronic kidney disease, stage 4 (severe): Secondary | ICD-10-CM

## 2015-08-16 DIAGNOSIS — N189 Chronic kidney disease, unspecified: Secondary | ICD-10-CM

## 2015-08-16 DIAGNOSIS — D509 Iron deficiency anemia, unspecified: Secondary | ICD-10-CM

## 2015-08-16 DIAGNOSIS — D472 Monoclonal gammopathy: Secondary | ICD-10-CM

## 2015-08-16 LAB — COMPREHENSIVE METABOLIC PANEL
ALBUMIN: 3.4 g/dL — AB (ref 3.5–5.0)
ALK PHOS: 89 U/L (ref 40–150)
ALT: 13 U/L (ref 0–55)
ANION GAP: 13 meq/L — AB (ref 3–11)
AST: 17 U/L (ref 5–34)
BUN: 51 mg/dL — ABNORMAL HIGH (ref 7.0–26.0)
CALCIUM: 8.9 mg/dL (ref 8.4–10.4)
CHLORIDE: 106 meq/L (ref 98–109)
CO2: 21 mEq/L — ABNORMAL LOW (ref 22–29)
CREATININE: 2.7 mg/dL — AB (ref 0.7–1.3)
EGFR: 28 mL/min/{1.73_m2} — ABNORMAL LOW (ref >90)
Glucose: 133 mg/dl (ref 70–140)
POTASSIUM: 3.8 meq/L (ref 3.5–5.1)
SODIUM: 140 meq/L (ref 136–145)
Total Bilirubin: 0.3 mg/dL (ref 0.20–1.20)
Total Protein: 7 g/dL (ref 6.4–8.3)

## 2015-08-16 LAB — CBC & DIFF AND RETIC
BASO%: 0.3 % (ref 0.0–2.0)
BASOS ABS: 0 10*3/uL (ref 0.0–0.1)
EOS ABS: 0.2 10*3/uL (ref 0.0–0.5)
EOS%: 3 % (ref 0.0–7.0)
HEMATOCRIT: 25.8 % — AB (ref 38.4–49.9)
HGB: 8.1 g/dL — ABNORMAL LOW (ref 13.0–17.1)
Immature Retic Fract: 5.2 % (ref 3.00–10.60)
LYMPH#: 1.3 10*3/uL (ref 0.9–3.3)
LYMPH%: 16.6 % (ref 14.0–49.0)
MCH: 23 pg — ABNORMAL LOW (ref 27.2–33.4)
MCHC: 31.4 g/dL — AB (ref 32.0–36.0)
MCV: 73.3 fL — ABNORMAL LOW (ref 79.3–98.0)
MONO#: 0.5 10*3/uL (ref 0.1–0.9)
MONO%: 6.8 % (ref 0.0–14.0)
NEUT#: 5.7 10*3/uL (ref 1.5–6.5)
NEUT%: 73.3 % (ref 39.0–75.0)
Platelets: 200 10*3/uL (ref 140–400)
RBC: 3.52 10*6/uL — ABNORMAL LOW (ref 4.20–5.82)
RDW: 18.7 % — AB (ref 11.0–14.6)
RETIC %: 1.56 % (ref 0.80–1.80)
RETIC CT ABS: 54.91 10*3/uL (ref 34.80–93.90)
WBC: 7.8 10*3/uL (ref 4.0–10.3)

## 2015-08-16 MED ORDER — SODIUM CHLORIDE 0.9 % IV SOLN
Freq: Once | INTRAVENOUS | Status: AC
  Administered 2015-08-16: 14:00:00 via INTRAVENOUS

## 2015-08-16 MED ORDER — SODIUM CHLORIDE 0.9 % IV SOLN
510.0000 mg | Freq: Once | INTRAVENOUS | Status: AC
  Administered 2015-08-16: 510 mg via INTRAVENOUS
  Filled 2015-08-16: qty 17

## 2015-08-17 ENCOUNTER — Ambulatory Visit (HOSPITAL_BASED_OUTPATIENT_CLINIC_OR_DEPARTMENT_OTHER): Payer: Medicaid Other

## 2015-08-17 VITALS — BP 165/69 | HR 73 | Temp 98.2°F | Resp 22

## 2015-08-17 DIAGNOSIS — D631 Anemia in chronic kidney disease: Secondary | ICD-10-CM

## 2015-08-17 DIAGNOSIS — N189 Chronic kidney disease, unspecified: Secondary | ICD-10-CM | POA: Diagnosis not present

## 2015-08-17 LAB — KAPPA/LAMBDA LIGHT CHAINS
IG KAPPA FREE LIGHT CHAIN: 111.9 mg/L — AB (ref 3.3–19.4)
IG LAMBDA FREE LIGHT CHAIN: 47.5 mg/L — AB (ref 5.7–26.3)
Kappa/Lambda FluidC Ratio: 2.36 — ABNORMAL HIGH (ref 0.26–1.65)

## 2015-08-17 LAB — FERRITIN: Ferritin: 115 ng/ml (ref 22–316)

## 2015-08-17 LAB — MULTIPLE MYELOMA PANEL, SERUM
ALBUMIN SERPL ELPH-MCNC: 3.3 g/dL (ref 2.9–4.4)
ALBUMIN/GLOB SERPL: 1.1 (ref 0.7–1.7)
ALPHA 1: 0.3 g/dL (ref 0.0–0.4)
ALPHA2 GLOB SERPL ELPH-MCNC: 0.8 g/dL (ref 0.4–1.0)
B-Globulin SerPl Elph-Mcnc: 1.3 g/dL (ref 0.7–1.3)
GAMMA GLOB SERPL ELPH-MCNC: 0.6 g/dL (ref 0.4–1.8)
Globulin, Total: 3.1 g/dL (ref 2.2–3.9)
IGA/IMMUNOGLOBULIN A, SERUM: 334 mg/dL (ref 61–437)
IGG (IMMUNOGLOBIN G), SERUM: 585 mg/dL — AB (ref 700–1600)
IgM, Qn, Serum: 91 mg/dL (ref 20–172)
Total Protein: 6.4 g/dL (ref 6.0–8.5)

## 2015-08-17 MED ORDER — DARBEPOETIN ALFA 500 MCG/ML IJ SOSY
500.0000 ug | PREFILLED_SYRINGE | Freq: Once | INTRAMUSCULAR | Status: AC
  Administered 2015-08-17: 500 ug via SUBCUTANEOUS
  Filled 2015-08-17: qty 1

## 2015-08-17 NOTE — Patient Instructions (Signed)
Darbepoetin Alfa injection What is this medicine? DARBEPOETIN ALFA (dar be POE e tin AL fa) helps your body make more red blood cells. It is used to treat anemia caused by chronic kidney failure and chemotherapy. This medicine may be used for other purposes; ask your health care provider or pharmacist if you have questions. What should I tell my health care provider before I take this medicine? They need to know if you have any of these conditions: -blood clotting disorders or history of blood clots -cancer patient not on chemotherapy -cystic fibrosis -heart disease, such as angina, heart failure, or a history of a heart attack -hemoglobin level of 12 g/dL or greater -high blood pressure -low levels of folate, iron, or vitamin B12 -seizures -an unusual or allergic reaction to darbepoetin, erythropoietin, albumin, hamster proteins, latex, other medicines, foods, dyes, or preservatives -pregnant or trying to get pregnant -breast-feeding How should I use this medicine? This medicine is for injection into a vein or under the skin. It is usually given by a health care professional in a hospital or clinic setting. If you get this medicine at home, you will be taught how to prepare and give this medicine. Do not shake the solution before you withdraw a dose. Use exactly as directed. Take your medicine at regular intervals. Do not take your medicine more often than directed. It is important that you put your used needles and syringes in a special sharps container. Do not put them in a trash can. If you do not have a sharps container, call your pharmacist or healthcare provider to get one. Talk to your pediatrician regarding the use of this medicine in children. While this medicine may be used in children as young as 1 year for selected conditions, precautions do apply. Overdosage: If you think you have taken too much of this medicine contact a poison control center or emergency room at once. NOTE:  This medicine is only for you. Do not share this medicine with others. What if I miss a dose? If you miss a dose, take it as soon as you can. If it is almost time for your next dose, take only that dose. Do not take double or extra doses. What may interact with this medicine? Do not take this medicine with any of the following medications: -epoetin alfa This list may not describe all possible interactions. Give your health care provider a list of all the medicines, herbs, non-prescription drugs, or dietary supplements you use. Also tell them if you smoke, drink alcohol, or use illegal drugs. Some items may interact with your medicine. What should I watch for while using this medicine? Visit your prescriber or health care professional for regular checks on your progress and for the needed blood tests and blood pressure measurements. It is especially important for the doctor to make sure your hemoglobin level is in the desired range, to limit the risk of potential side effects and to give you the best benefit. Keep all appointments for any recommended tests. Check your blood pressure as directed. Ask your doctor what your blood pressure should be and when you should contact him or her. As your body makes more red blood cells, you may need to take iron, folic acid, or vitamin B supplements. Ask your doctor or health care provider which products are right for you. If you have kidney disease continue dietary restrictions, even though this medication can make you feel better. Talk with your doctor or health care professional about the   foods you eat and the vitamins that you take. What side effects may I notice from receiving this medicine? Side effects that you should report to your doctor or health care professional as soon as possible: -allergic reactions like skin rash, itching or hives, swelling of the face, lips, or tongue -breathing problems -changes in vision -chest pain -confusion, trouble speaking  or understanding -feeling faint or lightheaded, falls -high blood pressure -muscle aches or pains -pain, swelling, warmth in the leg -rapid weight gain -severe headaches -sudden numbness or weakness of the face, arm or leg -trouble walking, dizziness, loss of balance or coordination -seizures (convulsions) -swelling of the ankles, feet, hands -unusually weak or tired Side effects that usually do not require medical attention (report to your doctor or health care professional if they continue or are bothersome): -diarrhea -fever, chills (flu-like symptoms) -headaches -nausea, vomiting -redness, stinging, or swelling at site where injected This list may not describe all possible side effects. Call your doctor for medical advice about side effects. You may report side effects to FDA at 1-800-FDA-1088. Where should I keep my medicine? Keep out of the reach of children. Store in a refrigerator between 2 and 8 degrees C (36 and 46 degrees F). Do not freeze. Do not shake. Throw away any unused portion if using a single-dose vial. Throw away any unused medicine after the expiration date. NOTE: This sheet is a summary. It may not cover all possible information. If you have questions about this medicine, talk to your doctor, pharmacist, or health care provider.    2016, [employer]. (2008-01-04 10:23:57)  

## 2015-08-20 ENCOUNTER — Encounter: Payer: Self-pay | Admitting: Family Medicine

## 2015-08-20 ENCOUNTER — Ambulatory Visit (INDEPENDENT_AMBULATORY_CARE_PROVIDER_SITE_OTHER): Payer: Medicaid Other | Admitting: Family Medicine

## 2015-08-20 VITALS — BP 162/70 | HR 67 | Temp 98.2°F | Ht 68.0 in | Wt 234.0 lb

## 2015-08-20 DIAGNOSIS — I5032 Chronic diastolic (congestive) heart failure: Secondary | ICD-10-CM

## 2015-08-20 DIAGNOSIS — I10 Essential (primary) hypertension: Secondary | ICD-10-CM | POA: Diagnosis not present

## 2015-08-20 DIAGNOSIS — N184 Chronic kidney disease, stage 4 (severe): Secondary | ICD-10-CM | POA: Diagnosis present

## 2015-08-20 LAB — BASIC METABOLIC PANEL
BUN: 43 mg/dL — AB (ref 7–25)
CALCIUM: 8.7 mg/dL (ref 8.6–10.3)
CHLORIDE: 106 mmol/L (ref 98–110)
CO2: 21 mmol/L (ref 20–31)
CREATININE: 2.64 mg/dL — AB (ref 0.70–1.25)
Glucose, Bld: 193 mg/dL — ABNORMAL HIGH (ref 65–99)
Potassium: 3.7 mmol/L (ref 3.5–5.3)
Sodium: 141 mmol/L (ref 135–146)

## 2015-08-20 NOTE — Progress Notes (Signed)
Subjective:     Patient ID: Leonard [name], male   DOB: [birth year], 65 y.o.   MRN: [id number]  HPI Mr. Neiderhiser is a 65yo male presenting for follow up of CHF. - Was supposed to transition to Lasix from Torsemide at discharge from hospital on 7/4 but was confused by medication list. Transitioned to Lasix 80mg  twice daily at hospital followup on 7/12. - Weight continues to gradually increase. Was 242pounds prior to hospitalization; discharge weight 226pounds. Was 232pounds at last office visit. 234pounds today. Dry weight suspected to be ~230pounds per PCP - Continues to deny shortness of breath, chest pain - Does note some worsening edema in his legs - Reports he has scheduled Nephrology follow up in August - Brought medications to visit for review. - Did not take medications yet this morning. - Last Echocardiogram 06/2015 with EF 60-65% and Grade 2 Diastolic Dysfunction - Former Smoker  Review of Systems Per HPI. Other systems negative.    Objective:   Physical Exam  Constitutional: He appears well-developed and well-nourished. No distress.  Cardiovascular: Normal rate.  Exam reveals no gallop and no friction rub.   No murmur heard. Pulmonary/Chest: Effort normal. No respiratory distress. He has no wheezes.  Crackles at bases  Abdominal: Soft. He exhibits no distension. There is no tenderness.  Musculoskeletal: He exhibits edema.  Skin: No rash noted.      Assessment and Plan:     Chronic diastolic heart failure (HCC) - Weight increased to 234, up from 232 one week ago. Edema noted. Denies shortness of breath. - Increase Lasix to three times daily dosing for 3 days. Then may decrease back down to twice daily. - Torsemide bottle still in medication bag. Marked to not take to decrease patient confusion. - Will check BMP - Was hesitant to return soon for follow up visit given two visits over last week. Agrees to return for Nurse Visit in one week to check weight and blood  pressure.  Resistant hypertension - Elevated to 168/62.  - Did not take antihypertensives this morning. Reluctant to change medications. - To follow up in one week for nurses visit to check weight and blood pressure

## 2015-08-20 NOTE — Assessment & Plan Note (Addendum)
-   Weight increased to 234, up from 232 one week ago. Edema noted. Denies shortness of breath. - Increase Lasix to three times daily dosing for 3 days. Then may decrease back down to twice daily. - Torsemide bottle still in medication bag. Marked to not take to decrease patient confusion. - Will check BMP - Was hesitant to return soon for follow up visit given two visits over last week. Agrees to return for Nurse Visit in one week to check weight and blood pressure.

## 2015-08-20 NOTE — Patient Instructions (Signed)
Thank you so much for coming to visit today! Your weight is still slowly increasing despite the lasix. Please increase your Furosemide (Lasix) to three times a day dosing for three days. You may then return to twice daily dosing. Please return in one week for a nurse's visit to recheck your blood pressure and weight.   If you develop worsening shortness of breath or swelling please let us know. If you develop headache, chest pain, or blurred vision, please call 911.  Please follow up with your Kidney Doctor.  Dr. Gerlean Ren

## 2015-08-20 NOTE — Assessment & Plan Note (Signed)
-   Elevated to 168/62.  - Did not take antihypertensives this morning. Reluctant to change medications. - To follow up in one week for nurses visit to check weight and blood pressure

## 2015-08-27 ENCOUNTER — Ambulatory Visit (INDEPENDENT_AMBULATORY_CARE_PROVIDER_SITE_OTHER): Payer: Medicaid Other | Admitting: Family Medicine

## 2015-08-27 ENCOUNTER — Other Ambulatory Visit: Payer: Self-pay

## 2015-08-27 ENCOUNTER — Inpatient Hospital Stay (HOSPITAL_COMMUNITY)
Admission: EM | Admit: 2015-08-27 | Discharge: 2015-09-02 | DRG: 291 | Disposition: A | Discharge status: Home Health | Payer: Medicaid Other | Attending: Family Medicine | Admitting: Family Medicine

## 2015-08-27 ENCOUNTER — Encounter (HOSPITAL_COMMUNITY): Payer: Self-pay

## 2015-08-27 ENCOUNTER — Ambulatory Visit (INDEPENDENT_AMBULATORY_CARE_PROVIDER_SITE_OTHER): Payer: Medicaid Other | Admitting: *Deleted

## 2015-08-27 ENCOUNTER — Emergency Department (HOSPITAL_COMMUNITY): Payer: Medicaid Other

## 2015-08-27 VITALS — BP 179/64 | HR 98 | Temp 98.3°F | Ht 68.0 in | Wt 246.0 lb

## 2015-08-27 DIAGNOSIS — Z87891 Personal history of nicotine dependence: Secondary | ICD-10-CM | POA: Diagnosis not present

## 2015-08-27 DIAGNOSIS — R0600 Dyspnea, unspecified: Secondary | ICD-10-CM | POA: Diagnosis present

## 2015-08-27 DIAGNOSIS — R0602 Shortness of breath: Secondary | ICD-10-CM | POA: Diagnosis present

## 2015-08-27 DIAGNOSIS — E1122 Type 2 diabetes mellitus with diabetic chronic kidney disease: Secondary | ICD-10-CM | POA: Diagnosis present

## 2015-08-27 DIAGNOSIS — I10 Essential (primary) hypertension: Secondary | ICD-10-CM | POA: Diagnosis present

## 2015-08-27 DIAGNOSIS — I5033 Acute on chronic diastolic (congestive) heart failure: Secondary | ICD-10-CM | POA: Diagnosis present

## 2015-08-27 DIAGNOSIS — N183 Chronic kidney disease, stage 3 (moderate): Secondary | ICD-10-CM | POA: Diagnosis not present

## 2015-08-27 DIAGNOSIS — R609 Edema, unspecified: Secondary | ICD-10-CM

## 2015-08-27 DIAGNOSIS — Z794 Long term (current) use of insulin: Secondary | ICD-10-CM

## 2015-08-27 DIAGNOSIS — I509 Heart failure, unspecified: Secondary | ICD-10-CM

## 2015-08-27 DIAGNOSIS — J441 Chronic obstructive pulmonary disease with (acute) exacerbation: Secondary | ICD-10-CM | POA: Diagnosis present

## 2015-08-27 DIAGNOSIS — I739 Peripheral vascular disease, unspecified: Secondary | ICD-10-CM | POA: Diagnosis not present

## 2015-08-27 DIAGNOSIS — E039 Hypothyroidism, unspecified: Secondary | ICD-10-CM | POA: Diagnosis present

## 2015-08-27 DIAGNOSIS — J9601 Acute respiratory failure with hypoxia: Secondary | ICD-10-CM | POA: Diagnosis present

## 2015-08-27 DIAGNOSIS — E876 Hypokalemia: Secondary | ICD-10-CM | POA: Diagnosis present

## 2015-08-27 DIAGNOSIS — E782 Mixed hyperlipidemia: Secondary | ICD-10-CM | POA: Diagnosis present

## 2015-08-27 DIAGNOSIS — I5043 Acute on chronic combined systolic (congestive) and diastolic (congestive) heart failure: Secondary | ICD-10-CM | POA: Diagnosis present

## 2015-08-27 DIAGNOSIS — Z79899 Other long term (current) drug therapy: Secondary | ICD-10-CM

## 2015-08-27 DIAGNOSIS — E1151 Type 2 diabetes mellitus with diabetic peripheral angiopathy without gangrene: Secondary | ICD-10-CM | POA: Diagnosis present

## 2015-08-27 DIAGNOSIS — Z951 Presence of aortocoronary bypass graft: Secondary | ICD-10-CM

## 2015-08-27 DIAGNOSIS — I5032 Chronic diastolic (congestive) heart failure: Secondary | ICD-10-CM

## 2015-08-27 DIAGNOSIS — E8770 Fluid overload, unspecified: Secondary | ICD-10-CM

## 2015-08-27 DIAGNOSIS — N189 Chronic kidney disease, unspecified: Secondary | ICD-10-CM

## 2015-08-27 DIAGNOSIS — G4733 Obstructive sleep apnea (adult) (pediatric): Secondary | ICD-10-CM | POA: Diagnosis present

## 2015-08-27 DIAGNOSIS — N184 Chronic kidney disease, stage 4 (severe): Secondary | ICD-10-CM | POA: Diagnosis present

## 2015-08-27 DIAGNOSIS — Z7982 Long term (current) use of aspirin: Secondary | ICD-10-CM | POA: Diagnosis not present

## 2015-08-27 DIAGNOSIS — D631 Anemia in chronic kidney disease: Secondary | ICD-10-CM | POA: Diagnosis present

## 2015-08-27 DIAGNOSIS — I130 Hypertensive heart and chronic kidney disease with heart failure and stage 1 through stage 4 chronic kidney disease, or unspecified chronic kidney disease: Principal | ICD-10-CM | POA: Diagnosis present

## 2015-08-27 DIAGNOSIS — I2510 Atherosclerotic heart disease of native coronary artery without angina pectoris: Secondary | ICD-10-CM | POA: Diagnosis present

## 2015-08-27 DIAGNOSIS — I5023 Acute on chronic systolic (congestive) heart failure: Secondary | ICD-10-CM

## 2015-08-27 DIAGNOSIS — I1A0 Resistant hypertension: Secondary | ICD-10-CM | POA: Diagnosis present

## 2015-08-27 DIAGNOSIS — I129 Hypertensive chronic kidney disease with stage 1 through stage 4 chronic kidney disease, or unspecified chronic kidney disease: Secondary | ICD-10-CM | POA: Diagnosis not present

## 2015-08-27 LAB — CBC WITH DIFFERENTIAL/PLATELET
BASOS PCT: 0 %
Basophils Absolute: 0 10*3/uL (ref 0.0–0.1)
EOS ABS: 0.2 10*3/uL (ref 0.0–0.7)
Eosinophils Relative: 2 %
HCT: 28.1 % — ABNORMAL LOW (ref 39.0–52.0)
HEMOGLOBIN: 8.4 g/dL — AB (ref 13.0–17.0)
Lymphocytes Relative: 8 %
Lymphs Abs: 0.8 10*3/uL (ref 0.7–4.0)
MCH: 23.1 pg — AB (ref 26.0–34.0)
MCHC: 29.9 g/dL — AB (ref 30.0–36.0)
MCV: 77.2 fL — AB (ref 78.0–100.0)
MONO ABS: 0.6 10*3/uL (ref 0.1–1.0)
Monocytes Relative: 6 %
NEUTROS ABS: 8.7 10*3/uL — AB (ref 1.7–7.7)
Neutrophils Relative %: 84 %
PLATELETS: 177 10*3/uL (ref 150–400)
RBC: 3.64 MIL/uL — ABNORMAL LOW (ref 4.22–5.81)
RDW: 21.8 % — ABNORMAL HIGH (ref 11.5–15.5)
WBC: 10.3 10*3/uL (ref 4.0–10.5)

## 2015-08-27 LAB — GLUCOSE, CAPILLARY
GLUCOSE-CAPILLARY: 130 mg/dL — AB (ref 65–99)
Glucose-Capillary: 190 mg/dL — ABNORMAL HIGH (ref 65–99)

## 2015-08-27 LAB — BASIC METABOLIC PANEL
Anion gap: 11 (ref 5–15)
BUN: 41 mg/dL — ABNORMAL HIGH (ref 6–20)
CALCIUM: 8.5 mg/dL — AB (ref 8.9–10.3)
CO2: 21 mmol/L — AB (ref 22–32)
CREATININE: 2.51 mg/dL — AB (ref 0.61–1.24)
Chloride: 110 mmol/L (ref 101–111)
GFR calc Af Amer: 30 mL/min — ABNORMAL LOW (ref >60)
GFR calc non Af Amer: 25 mL/min — ABNORMAL LOW (ref >60)
GLUCOSE: 238 mg/dL — AB (ref 65–99)
Potassium: 3.5 mmol/L (ref 3.5–5.1)
Sodium: 142 mmol/L (ref 135–145)

## 2015-08-27 LAB — I-STAT TROPONIN, ED: TROPONIN I, POC: 0.01 ng/mL (ref 0.00–0.08)

## 2015-08-27 LAB — BRAIN NATRIURETIC PEPTIDE: B Natriuretic Peptide: 415.9 pg/mL — ABNORMAL HIGH (ref 0.0–100.0)

## 2015-08-27 MED ORDER — INSULIN GLARGINE 100 UNIT/ML ~~LOC~~ SOLN
30.0000 [IU] | Freq: Every day | SUBCUTANEOUS | Status: DC
  Administered 2015-08-27 – 2015-08-31 (×4): 30 [IU] via SUBCUTANEOUS
  Filled 2015-08-27 (×6): qty 0.3

## 2015-08-27 MED ORDER — ASPIRIN EC 81 MG PO TBEC
81.0000 mg | DELAYED_RELEASE_TABLET | Freq: Every day | ORAL | Status: DC
  Administered 2015-08-27 – 2015-09-02 (×7): 81 mg via ORAL
  Filled 2015-08-27 (×7): qty 1

## 2015-08-27 MED ORDER — ATORVASTATIN CALCIUM 40 MG PO TABS
40.0000 mg | ORAL_TABLET | Freq: Every day | ORAL | Status: DC
  Administered 2015-08-27 – 2015-09-02 (×7): 40 mg via ORAL
  Filled 2015-08-27 (×7): qty 1

## 2015-08-27 MED ORDER — FUROSEMIDE 10 MG/ML IJ SOLN
40.0000 mg | Freq: Once | INTRAMUSCULAR | Status: DC
  Administered 2015-08-27: 40 mg via INTRAVENOUS

## 2015-08-27 MED ORDER — FUROSEMIDE 10 MG/ML IJ SOLN
80.0000 mg | Freq: Once | INTRAMUSCULAR | Status: AC
  Administered 2015-08-27: 80 mg via INTRAVENOUS
  Filled 2015-08-27: qty 8

## 2015-08-27 MED ORDER — HYDRALAZINE HCL 50 MG PO TABS
100.0000 mg | ORAL_TABLET | Freq: Three times a day (TID) | ORAL | Status: DC
  Administered 2015-08-27 – 2015-09-02 (×17): 100 mg via ORAL
  Filled 2015-08-27 (×18): qty 2

## 2015-08-27 MED ORDER — INSULIN ASPART 100 UNIT/ML ~~LOC~~ SOLN: 0.0000 [IU] | Freq: Every day | SUBCUTANEOUS | Status: DC

## 2015-08-27 MED ORDER — INSULIN ASPART 100 UNIT/ML ~~LOC~~ SOLN
20.0000 [IU] | Freq: Three times a day (TID) | SUBCUTANEOUS | Status: DC
Start: 2015-08-27 — End: 2015-08-31
  Administered 2015-08-28 – 2015-08-30 (×6): 20 [IU] via SUBCUTANEOUS

## 2015-08-27 MED ORDER — FUROSEMIDE 10 MG/ML IJ SOLN
40.0000 mg | Freq: Once | INTRAMUSCULAR | Status: AC
  Administered 2015-08-27: 40 mg via INTRAVENOUS
  Filled 2015-08-27: qty 4

## 2015-08-27 MED ORDER — FLUTICASONE FUROATE-VILANTEROL 200-25 MCG/INH IN AEPB
1.0000 | INHALATION_SPRAY | Freq: Every day | RESPIRATORY_TRACT | Status: DC
  Administered 2015-08-28 – 2015-09-02 (×5): 1 via RESPIRATORY_TRACT
  Filled 2015-08-27: qty 28

## 2015-08-27 MED ORDER — AMLODIPINE BESYLATE 10 MG PO TABS
10.0000 mg | ORAL_TABLET | Freq: Every day | ORAL | Status: DC
  Administered 2015-08-27 – 2015-09-02 (×7): 10 mg via ORAL
  Filled 2015-08-27 (×7): qty 1

## 2015-08-27 MED ORDER — ENOXAPARIN SODIUM 40 MG/0.4ML ~~LOC~~ SOLN
40.0000 mg | SUBCUTANEOUS | Status: DC
  Administered 2015-08-27 – 2015-09-01 (×6): 40 mg via SUBCUTANEOUS
  Filled 2015-08-27 (×6): qty 0.4

## 2015-08-27 MED ORDER — ALBUTEROL SULFATE (2.5 MG/3ML) 0.083% IN NEBU: 5.0000 mg | INHALATION_SOLUTION | Freq: Once | RESPIRATORY_TRACT | Status: DC

## 2015-08-27 MED ORDER — INSULIN ASPART 100 UNIT/ML ~~LOC~~ SOLN
0.0000 [IU] | Freq: Three times a day (TID) | SUBCUTANEOUS | Status: DC
Start: 2015-08-27 — End: 2015-09-02
  Administered 2015-08-27 – 2015-08-28 (×2): 3 [IU] via SUBCUTANEOUS
  Administered 2015-08-28: 5 [IU] via SUBCUTANEOUS
  Administered 2015-08-29: 3 [IU] via SUBCUTANEOUS
  Administered 2015-08-29: 2 [IU] via SUBCUTANEOUS
  Administered 2015-08-29: 3 [IU] via SUBCUTANEOUS
  Administered 2015-08-30: 2 [IU] via SUBCUTANEOUS
  Administered 2015-08-31: 5 [IU] via SUBCUTANEOUS
  Administered 2015-08-31: 2 [IU] via SUBCUTANEOUS
  Administered 2015-08-31: 8 [IU] via SUBCUTANEOUS
  Administered 2015-09-01: 2 [IU] via SUBCUTANEOUS
  Administered 2015-09-01 (×2): 3 [IU] via SUBCUTANEOUS
  Administered 2015-09-02: 2 [IU] via SUBCUTANEOUS
  Administered 2015-09-02: 3 [IU] via SUBCUTANEOUS

## 2015-08-27 MED ORDER — CARVEDILOL 25 MG PO TABS
50.0000 mg | ORAL_TABLET | Freq: Two times a day (BID) | ORAL | Status: DC
  Administered 2015-08-27 – 2015-08-31 (×9): 50 mg via ORAL
  Filled 2015-08-27 (×9): qty 2

## 2015-08-27 MED ORDER — PANTOPRAZOLE SODIUM 40 MG PO TBEC
40.0000 mg | DELAYED_RELEASE_TABLET | Freq: Every day | ORAL | Status: DC
  Administered 2015-08-27 – 2015-09-02 (×7): 40 mg via ORAL
  Filled 2015-08-27 (×7): qty 1

## 2015-08-27 MED ORDER — DOXAZOSIN MESYLATE 2 MG PO TABS
2.0000 mg | ORAL_TABLET | Freq: Two times a day (BID) | ORAL | Status: DC
  Administered 2015-08-27 – 2015-09-02 (×12): 2 mg via ORAL
  Filled 2015-08-27 (×14): qty 1

## 2015-08-27 MED ORDER — LEVOTHYROXINE SODIUM 50 MCG PO TABS
50.0000 ug | ORAL_TABLET | Freq: Every day | ORAL | Status: DC
  Administered 2015-08-28 – 2015-09-02 (×6): 50 ug via ORAL
  Filled 2015-08-27 (×6): qty 1

## 2015-08-27 MED ORDER — ISOSORBIDE MONONITRATE ER 60 MG PO TB24
60.0000 mg | ORAL_TABLET | Freq: Every day | ORAL | Status: DC
  Administered 2015-08-27 – 2015-09-02 (×7): 60 mg via ORAL
  Filled 2015-08-27 (×7): qty 1

## 2015-08-27 NOTE — H&P (Signed)
[facility] Admission History and Physical Service Pager: [phone]  [name] Medical record number: [id number] Date of birth: September 19, 1950 Age: 65 y.o. Gender: male  Primary Care Provider: [name], MD Consultants: none Code Status: FULL  Chief Complaint: shortness of breath  Assessment and Plan: SACHIT FRANCHINA is a 65 y.o. male presenting with shortness of breath . 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  Shortness of breath and hypoxia likely 2/2 CHF exacerbation. HFpEF with EF 60-65% and grade 2 diastolic dysfunction on most recent Echocardiogram from 06/2015. On admission to the ED patient O2 sat were 80s and he was placed on 4L [place] with improvement of his oxygen saturation to mid 90%. BNP 415.9 CXR showing R interstitial pulmonary edema. Received IV lasix 80mg  in ED. Fluid overload on physical exam, with 3+ pitting edema to knees bilaterally. Less likely COPD exacerbation given CXR findings, lack of cough, and fluid overload on physical exam. - Place in observation, under Dr. Andria Frames  - Daily weights, I/Os - Give additional IV lasix 40mg  - Reassess volume status tomorrow and adjust diuresis accordingly - Hold home PO Lasix 80mg  BID - Continue supplemental O2 as needed - Continuous pulse ox  COPD, not currently in exacerbation. Not currently on home O2, had previous home 2L [place] but it was discontinued about 6 months ago. Previous smoker, quit 2015, 46 pack year history. COPD exacerbation unlikely given lack of cough. Now with new O2 requirement, though more likely 2/2 CHF than COPD exacerbation.  - Continue albuterol 2 puff q6 prn and home Dulera - O2 to keep sats above 90%, Wean off as tolerated - Follow up with PCP to reassess need for home O2 - Consider low dose lung CT if no improvement given  smoking history. Consider as outpatient for lung cancer screening at discharge.  Hypertension, chronic. With h/o resistant HTN requiring multiple medications. Patient blood pressure has been elevated last admission ranging around from 175/71-204/94. History of Renal US in 06/2015 with left renal cortical irregularity, scarring cannot be excluded, otherwise unremarkable. Patient denies any headaches, blurry vision or other symptoms related to hypertensive urgency. Hypertensive at 170/83 on admission.  - Will continue current home regimen and monitor BP. - Amlodipine 10 mg po once daily - Carvedilol 50mg  po BID - Doxazosin 2mg  po q12 - Hydralazine 100mg  po q8  DM type II At home, takes 65 units of lantus bid and 35 units of novolog tid. Patient po intake have decreased with hospitalization and insulin need will be readjusted in consequence. Last A1C 10.3 in 4/30,2017, next due on 09/02/15. CBG 238 on admission.  - While admitted lantus 20U aspart 20 units 3 times with meals along with moderate Insulin Sliding Scale Glargine 30 units, 2 times daily   CKD III/IV, stable creatinine was 2.51 on admission, at baseline. Followed by nephrologist as outpatient. - outpatient follow up with nephrologist - Continue to monitor Cr while diuresing  Hypothyroidism TSH in May 2017 was elevated to 12.559. Synthroid recently started in 06/2015.  -Levothyroxine 63mcg po once daily - Recommend outpatient f/u with repeat TSH  Hyperlipidemia Well controlled, will continue home regimen. - continue home Atorvastatin 40 mg once daily  FEN/GI: heart healthy diet, protonix Prophylaxis: lovenox  Disposition: place in observation  History of Present Illness:  Leonard [name] is a 65 y.o. male presenting  with shortness of breath.   States has been having gradually worsening SOB since last discharge, that became significant around 1AM last night. Of note, he was admitted for similar symptoms from 07/04-07/06 and was  diagnosed with CHF exacerbation. Felt like he wasn't able to catch his breath and went to clinic today where he was told he needed to come back to the hospital. Thinks feet and legs are slightly more swollen than when discharged. Denies chest pain, coughing. Endorses intermittent wheezing.  Breathing improved when sitting in chair, could not lie flat. Worse with exertion. Has been taking Lasix 80 mg BID at home, did not realize was told to take TID and believe his nurse did not do this when filling his pill box. States may also not have been taking some of his home BP meds. Denies headaches, blurry vision. Denies constipation, diarrhea, abdominal pain, N/V, no problems urinating.    Review Of Systems: Per HPI otherwise the remainder of the systems were negative.   Patient Active Problem List   Diagnosis Date Noted  . CHF (congestive heart failure) ([place]) 08/27/2015  . Shortness of breath 08/27/2015  . Iron deficiency anemia 08/03/2015  . Claudication ([place])   . Type 2 diabetes mellitus with stage 3 chronic kidney disease, with long-term current use of insulin ([place])   . Hematuria 06/27/2015  . Knee pain   . Skin lesion of face 02/22/2015  . 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 Anemia in chronic kidney disease 01/15/2015  . CKD (chronic kidney disease) stage 4, GFR 15-29 ml/min ([facility]) 01/15/2015  . Hypothyroidism 11/22/2014  . Health care maintenance 11/22/2014  . Chronic diastolic heart failure ([place]) 03/16/2014  . COPD (chronic obstructive pulmonary disease) ([place]) 02/16/2014  . History of tobacco use 08/23/2013  . 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 CAD (coronary artery disease) 04/06/2013  . PVD - hx of Rt SFA PTA and s/p Rt 4-5th toe amp Dec 2014 04/05/2013  . 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 Mixed hyperlipidemia 07/17/2008    Past Medical History: Past Medical History:  Diagnosis Date  . CHF (congestive heart failure) ([place])   . Gangrene of toe ([place])    right  5th/notes 01/04/2013   . High cholesterol   . Hypertension   . Iron deficiency anemia 08/03/2015  . 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 PAD (peripheral artery disease) ([place])   . Shortness of breath dyspnea   . Type II diabetes mellitus ([place])     Past Surgical History: Past Surgical History:  Procedure Laterality Date  . AMPUTATION Right 01/28/2013   Procedure: RAY AMPUTATION RIGHT  4th & 5th TOE;  Surgeon: Rosetta Posner, MD;  Location: [facility] OR;  Service: Vascular;  Laterality: Right;  . ANGIOPLASTY / STENTING FEMORAL Right 01/13/2013   superficial femoral artery x 2 (3 mm x 60 mm, 4 mm x 60 mm)  . CORONARY ARTERY BYPASS GRAFT N/A 04/07/2013   Procedure: CORONARY ARTERY BYPASS GRAFTING (CABG);  Surgeon: Ivin Poot, MD;  Location: [facility];  Service: Open Heart Surgery;  Laterality: N/A;  . INTRAOPERATIVE TRANSESOPHAGEAL ECHOCARDIOGRAM N/A 04/07/2013   Procedure: INTRAOPERATIVE TRANSESOPHAGEAL ECHOCARDIOGRAM;  Surgeon: Ivin Poot, MD;  Location: [facility];  Service: Open Heart Surgery;  Laterality: N/A;  . LEFT HEART CATHETERIZATION WITH CORONARY ANGIOGRAM N/A 04/05/2013   Procedure: LEFT HEART CATHETERIZATION WITH CORONARY ANGIOGRAM;  Surgeon: Peter M Martinique, MD;  Location[facility] C[facility] LAB;  Service: Cardiovascular;  Laterality: N/A;  .  LOWER EXTREMITY ANGIOGRAM Bilateral 01/06/2013   Procedure: LOWER EXTREMITY ANGIOGRAM;  Surgeon: Conrad Fairview, MD;  Location: [facility] CATH LAB;  Service: Cardiovascular;  Laterality: Bilateral;  . LOWER EXTREMITY ANGIOGRAM Right 01/13/2013   Procedure: LOWER EXTREMITY ANGIOGRAM;  Surgeon: Conrad Thayer, MD;  Location: [facility] CATH LAB;  Service: Cardiovascular;  Laterality: Right;  . SKIN CANCER EXCISION  2017  . THROAT SURGERY  1983   polyps  . TONSILLECTOMY AND ADENOIDECTOMY  ~ 1965    Social History: Social History  Substance Use Topics  . Smoking status: Former Smoker    Packs/day: 1.00    Years: 46.00    Types: Cigarettes    Start  date: 02/04/1967    Quit date: 02/03/2013  . Smokeless tobacco: Never Used     Comment: Doing well with quitting.  . Alcohol use No   Please also refer to relevant sections of EMR.  Family History: Family History  Problem Relation Age of Onset  . Cancer Mother     lung cancer  . Hypertension Mother   . Cancer Sister     patient thinks it was uterine cancer  . Hypertension Sister   . Heart attack Sister   . Stroke Neg Hx     Allergies and Medications: No Known Allergies No current facility-administered medications on file prior to encounter.    Current Outpatient Prescriptions on File Prior to Encounter  Medication Sig Dispense Refill  . acetaminophen (TYLENOL) 500 MG tablet Take 1 tablet (500 mg total) by mouth every 6 (six) hours as needed for mild pain or moderate pain. 30 tablet 0  . ADVAIR DISKUS 250-50 MCG/DOSE AEPB INHALE 1 PUFF INTO THE LUNGS 2 TIMES DAILY 60 each 1  . albuterol (PROVENTIL HFA;VENTOLIN HFA) 108 (90 Base) MCG/ACT inhaler Inhale 2 puffs into the lungs every 6 (six) hours as needed for wheezing or shortness of breath. 1 Inhaler 2  . amLODipine (NORVASC) 10 MG tablet Take 1 tablet (10 mg total) by mouth daily. 30 tablet 0  . aspirin EC 81 MG EC tablet Take 1 tablet (81 mg total) by mouth daily. 30 tablet 0  . atorvastatin (LIPITOR) 40 MG tablet TAKE 1 TABLET BY MOUTH EVERY DAY AT 6 PM 30 tablet 0  . carvedilol (COREG) 25 MG tablet Take 2 tablets (50 mg total) by mouth 2 (two) times daily with a meal. 60 tablet 0  . doxazosin (CARDURA) 2 MG tablet TAKE ONE TABLET BY MOUTH EVERY TWELVE HOURS 60 tablet 1  . furosemide (LASIX) 80 MG tablet Take 1 tablet (80 mg total) by mouth 2 (two) times daily. 60 tablet 1  . hydrALAZINE (APRESOLINE) 100 MG tablet TAKE 1 TABLET (100MG ) BY MOUTH EVERY 8 HOURS 90 tablet 1  . insulin aspart (NOVOLOG FLEXPEN) 100 UNIT/ML FlexPen Inject 35 Units into the skin 3 (three) times daily with meals. 15 mL 0  . Insulin Glargine (LANTUS  SOLOSTAR) 100 UNIT/ML Solostar Pen Inject 30 Units into the skin daily. 15 mL 2  . isosorbide mononitrate (IMDUR) 60 MG 24 hr tablet TAKE ONE (1) TABLET BY MOUTH EVERY DAY 30 tablet 3  . levothyroxine (SYNTHROID, LEVOTHROID) 50 MCG tablet Take 1 tablet (50 mcg total) by mouth daily before breakfast. 30 tablet 2  . nitroGLYCERIN (NITROSTAT) 0.4 MG SL tablet Place 1 tablet (0.4 mg total) under the tongue every 5 (five) minutes as needed for chest pain. 30 tablet 12  . pantoprazole (PROTONIX) 40 MG tablet Take 1 tablet (40  mg total) by mouth daily. 30 tablet 0  . SURE COMFORT PEN NEEDLES 31G X 8 MM MISC USE WITH BOTH INSULIN PENS TWICE DAILY (4 PER DAY) 100 each 5    Objective: BP (!) 170/83 (BP Location: Right Arm)   Pulse 80   Temp 98.3 F (36.8 C) (Oral)   Resp (!) 21   Ht 5\' 8"  (1.727 m)   Wt 242 lb 4.8 oz (109.9 kg)   SpO2 93%   BMI 36.84 kg/m  Exam: General: Laying in bed with head of bed at 45 degrees, appears SOB, in NAD Eyes: EOMI, PERRL ENTM: MMM Cardiovascular: RRR, no murmurs appreciated Respiratory: CTAB, normal effort Abdomen: soft, nt, nd, +bs MSK: moving limbs spontaneously Skin: no rashes noted Neuro: A&Ox3, no focal deficits Psych: appropriate affect  Labs and Imaging: CBC BMET   Recent Labs Lab 08/27/15 1120  WBC 10.3  HGB 8.4*  HCT 28.1*  PLT 177    Recent Labs Lab 08/27/15 1120  NA 142  K 3.5  CL 110  CO2 21*  BUN 41*  CREATININE 2.51*  GLUCOSE 238*  CALCIUM 8.5*    BNP 415.9  Troponin 0.01  Dg Chest Portable 1 View  Result Date: 08/27/2015 CLINICAL DATA:  Shortness of breath starting 24 hours ago. EXAM: PORTABLE CHEST 1 VIEW COMPARISON:  08/06/2015 FINDINGS: Stable post CABG surgical changes. The cardiac silhouette is enlarged. Mediastinal contours appear intact. There is no evidence of pneumothorax. There is interstitial pulmonary edema. There has been interval development of right pleural effusion. Streaky airspace opacities in the  right lower lobe likely represent atelectasis. Osseous structures are without acute abnormality. Soft tissues are grossly normal. IMPRESSION: Interval development of interstitial pulmonary edema and right pleural effusion, with probable atelectasis in the right lower lobe. Stable enlargement of the cardiac silhouette. Electronically Signed   By: Fidela Salisbury M.D.   On: 08/27/2015 11:59   Verner Mould, MD 08/27/2015, 4:03 PM PGY-1, [place] Intern pager: [phone], text pages welcome  UPPER LEVEL ADDENDUM  I have read the above note and made revisions highlighted in orange.  Adin Hector, MD, MPH PGY-2 [facility] Pager [phone]

## 2015-08-27 NOTE — ED Triage Notes (Signed)
Per EMS - pt coming from [facility]. Pt went to PCP to be evaluated for shortness of breath beginning last night, worse today. EMS called for low SpO2, uncertain as to initial SpO2. Placed on NRB. 12-lead showed some ST elevation. Given 40mg  IV lasix upon EMS arrival. CBG 251  RA SpO2 84-85% upon arrival. Placed on 4L [place], SpO2 94-96%. Resp labored, dyspnea at rest, pt visibly uncomfortable.

## 2015-08-27 NOTE — Progress Notes (Signed)
Subjective:     Patient ID: Leonard [name], male   DOB: 1950/03/17, 65 y.o.   MRN: [id number]  HPI  Mr. Glawson is a 65yo male presenting for shortness of breath and edema. - Recently admitted from 7/3-7/4 for same complaint - Was seen in clinic on 7/17. Lasix was increased to three times daily dosing for three days. Patient remembers this being discussed, but does not think his nurse did his pill box correctly. Chart review does show medication dosing change was documented in after visit summary given to patient at last office visit. - Has noticed increased weight and swelling - Became significantly short of breath this morning - Denies chest pain, diaphoresis  - Former Smoker  - O2 sat of 83% on 4L [place] in office - Weight 246, dry weight 130 - 40lasix IM given prior to transfer to ED.  Review of Systems Per HPI. Other systems negative.    Objective:   Physical Exam  Constitutional: He appears well-developed and well-nourished. No distress.  Cardiovascular: Normal rate and regular rhythm.   No murmur heard. Pulmonary/Chest:  Significantly increased work of breathing with use of accessory muscles. Nasal cannula in place.   Musculoskeletal:  3+ pitting edema in bilateral lower extremities      Assessment and Plan:     CHF (congestive heart failure) ([place]) - Significantly worsened since last office visit. Initially satting in low 80s with 4L [place]. [place] increased to 6L with improvement to low 90s, however then worsened to 70s. - EMS called. Delay in arrival due to misdocumentation of "Non-Urgent" instead of "Urgent" by dispatcher. Review of voice recording of call shows that nurse did specify correctly urgency of condition. - 40mg  of IM lasix given prior to discharge - Discharged to care to EMS to take to ED. Anticipate admission to family medicine teaching service for diuresis.

## 2015-08-27 NOTE — Progress Notes (Signed)
Pt arrives with swelling and labored breathing.  His SPO2 is 81%.  Placed on 2 liters of oxygen, Per Dr. Erin Hearing.  Put in to see Dr. Gerlean Ren. Izzah Pasqua, Salome Spotted, CMA

## 2015-08-27 NOTE — ED Notes (Signed)
Dr. Tamera Punt, [place] aware pt resp labored, dyspnea at rest, accessory muscle use. EDP at bedside.

## 2015-08-27 NOTE — Assessment & Plan Note (Addendum)
-   Significantly worsened since last office visit. Initially satting in low 80s with 4L [place]. [place] increased to 6L with improvement to low 90s, however then worsened to 70s. - EMS called. Delay in arrival due to misdocumentation of "Non-Urgent" instead of "Urgent" by dispatcher. Review of voice recording of call shows that nurse did specify correctly urgency of condition. Patient monitored by nursing, Dr. Gwendlyn Deutscher, and myself until arrival of EMS. - 40mg  of IM lasix given prior to discharge - Discharged to care to EMS to take to ED. Anticipate admission to family medicine teaching service for diuresis.

## 2015-08-27 NOTE — ED Notes (Signed)
Attempted report x1. 

## 2015-08-27 NOTE — ED Provider Notes (Signed)
[facility] DEPT Provider Note   CSN: [id number] Arrival date & time: 08/27/15  1106  First Provider Contact:  First MD Initiated Contact with Patient 08/27/15 1126        History   Chief Complaint Chief Complaint  Patient presents with  . Shortness of Breath    HPI LENNIX WILTGEN is a 65 y.o. male.  Patient with a history of CHF, hyperlipidemia, hypertension and diabetes presents with shortness of breath. He describes worsening shortness of breath over the last 2-3 days. He's also had increase in his weight from 132-143 over the last week. He was seen today at the family [place] where he was found to be hypoxic and short of breath. He was given 1 dose of 40 mg Lasix and sent to the ED. He states he feels a little bit better since that time. He's had increase in leg edema over the last several days. He denies any chest pain. No productive cough. No fevers.      Past Medical History:  Diagnosis Date  . CHF (congestive heart failure) ([place])   . Gangrene of toe ([place])    right 5th/notes 01/04/2013   . High cholesterol   . Hypertension   . Iron deficiency anemia 08/03/2015  . 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 PAD (peripheral artery disease) ([place])   . Shortness of breath dyspnea   . Type II diabetes mellitus [facility])     Patient Active Problem List   Diagnosis Date Noted  . CHF (congestive heart failure) ([place]) 08/27/2015  . Iron deficiency anemia 08/03/2015  . Claudication ([place])   . Type 2 diabetes mellitus with stage 3 chronic kidney disease, with long-term current use of insulin ([place])   . Hematuria 06/27/2015  . Knee pain   . Skin lesion of face 02/22/2015  . 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 Anemia in chronic kidney disease 01/15/2015  . CKD (chronic kidney disease) stage 4, GFR 15-29 ml/min (H[facility]) 01/15/2015  . Hypothyroidism 11/22/2014  . Health care maintenance 11/22/2014  . Chronic  diastolic heart failure ([place]) 03/16/2014  . COPD (chronic obstructive pulmonary disease) ([place]) 02/16/2014  . History of tobacco use 08/23/2013  . 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 CAD (coronary artery disease) 04/06/2013  . PVD - hx of Rt SFA PTA and s/p Rt 4-5th toe amp Dec 2014 04/05/2013  . 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 Mixed hyperlipidemia 07/17/2008    Past Surgical History:  Procedure Laterality Date  . AMPUTATION Right 01/28/2013   Procedure: RAY AMPUTATION RIGHT  4th & 5th TOE;  Surgeon: Rosetta Posner, MD;  Location: [facility] OR;  Service: Vascular;  Laterality: Right;  . ANGIOPLASTY / STENTING FEMORAL Right 01/13/2013   superficial femoral artery x 2 (3 mm x 60 mm, 4 mm x 60 mm)  . CORONARY ARTERY BYPASS GRAFT N/A 04/07/2013   Procedure: CORONARY ARTERY BYPASS GRAFTING (CABG);  Surgeon: Ivin Poot, MD;  Location: [place];  Service: Open Heart Surgery;  Laterality: N/A;  . INTRAOPERATIVE TRANSESOPHAGEAL ECHOCARDIOGRAM N/A 04/07/2013   Procedure: INTRAOPERATIVE TRANSESOPHAGEAL ECHOCARDIOGRAM;  Surgeon: Ivin Poot, MD;  Location: [place];  Service: Open Heart Surgery;  Laterality: N/A;  . LEFT HEART CATHETERIZATION WITH CORONARY ANGIOGRAM N/A 04/05/2013   Procedure: LEFT HEART CATHETERIZATION WITH CORONARY ANGIOGRAM;  Surgeon: Peter M Martinique, MD;  Location: [facility] CATH LAB;  Service: Cardiovascular;  Laterality: N/A;  . LOWER EXTREMITY ANGIOGRAM Bilateral 01/06/2013   Procedure:  LOWER EXTREMITY ANGIOGRAM;  Surgeon: Conrad Whiteash, MD;  Location[facility] CATH LAB;  Service: Cardiovascular;  Laterality: Bilateral;  . LOWER EXTREMITY ANGIOGRAM Right 01/13/2013   Procedure: LOWER EXTREMITY ANGIOGRAM;  Surgeon: Conrad [place], MD;  Location: [facility] CATH LAB;  Service: Cardiovascular;  Laterality: Right;  . SKIN CANCER EXCISION  2017  . THROAT SURGERY  1983   polyps  . TONSILLECTOMY AND ADENOIDECTOMY  ~ 1965       Home Medications    Prior to Admission medications   Medication Sig Start Date End Date  Taking? Authorizing Provider  acetaminophen (TYLENOL) 500 MG tablet Take 1 tablet (500 mg total) by mouth every 6 (six) hours as needed for mild pain or moderate pain. 06/06/15   Mercy Riding, MD  ADVAIR DISKUS 250-50 MCG/DOSE AEPB INHALE 1 PUFF INTO THE LUNGS 2 TIMES DAILY 06/25/15   Rosemarie Ax, MD  albuterol (PROVENTIL HFA;VENTOLIN HFA) 108 (90 Base) MCG/ACT inhaler Inhale 2 puffs into the lungs every 6 (six) hours as needed for wheezing or shortness of breath. 06/12/15   Hillary Corinda Gubler, MD  amLODipine (NORVASC) 10 MG tablet Take 1 tablet (10 mg total) by mouth daily. 08/09/15   Vivi Barrack, MD  aspirin EC 81 MG EC tablet Take 1 tablet (81 mg total) by mouth daily. 06/06/15   Mercy Riding, MD  atorvastatin (LIPITOR) 40 MG tablet TAKE 1 TABLET BY MOUTH EVERY DAY AT 6 PM 08/09/15   Vivi Barrack, MD  carvedilol (COREG) 25 MG tablet Take 2 tablets (50 mg total) by mouth 2 (two) times daily with a meal. 08/09/15   Vivi Barrack, MD  doxazosin (CARDURA) 2 MG tablet TAKE ONE TABLET BY MOUTH EVERY TWELVE HOURS 08/16/15   [name], MD  furosemide (LASIX) 80 MG tablet Take 1 tablet (80 mg total) by mouth 2 (two) times daily. 08/15/15   Nicolette Bang, DO  hydrALAZINE (APRESOLINE) 100 MG tablet TAKE 1 TABLET (100MG ) BY MOUTH EVERY 8 HOURS 07/31/15   Rosemarie Ax, MD  insulin aspart (NOVOLOG FLEXPEN) 100 UNIT/ML FlexPen Inject 35 Units into the skin 3 (three) times daily with meals. 08/09/15   Vivi Barrack, MD  Insulin Glargine (LANTUS SOLOSTAR) 100 UNIT/ML Solostar Pen Inject 30 Units into the skin daily. 08/09/15   Vivi Barrack, MD  isosorbide mononitrate (IMDUR) 60 MG 24 hr tablet TAKE ONE (1) TABLET BY MOUTH EVERY DAY 08/02/15   Rosemarie Ax, MD  levothyroxine (SYNTHROID, LEVOTHROID) 50 MCG tablet Take 1 tablet (50 mcg total) by mouth daily before breakfast. 06/22/15   Sela Hua, MD  nitroGLYCERIN (NITROSTAT) 0.4 MG SL tablet Place 1 tablet (0.4 mg total) under the  tongue every 5 (five) minutes as needed for chest pain. 06/06/15   Mercy Riding, MD  pantoprazole (PROTONIX) 40 MG tablet Take 1 tablet (40 mg total) by mouth daily. 08/09/15   Vivi Barrack, MD  SURE COMFORT PEN NEEDLES 31G X 8 MM MISC USE WITH BOTH INSULIN PENS TWICE DAILY (4 PER DAY) 06/14/15   Olin Hauser, DO    Family History Family History  Problem Relation Age of Onset  . Cancer Mother     lung cancer  . Hypertension Mother   . Cancer Sister     patient thinks it was uterine cancer  . Hypertension Sister   . Heart attack Sister   . Stroke Neg Hx     Social History  Social History  Substance Use Topics  . Smoking status: Former Smoker    Packs/day: 1.00    Years: 46.00    Types: Cigarettes    Start date: 02/04/1967    Quit date: 02/03/2013  . Smokeless tobacco: Never Used     Comment: Doing well with quitting.  . Alcohol use No     Allergies   Review of patient's allergies indicates no known allergies.   Review of Systems Review of Systems  Constitutional: Positive for fatigue. Negative for chills, diaphoresis and fever.  HENT: Negative for congestion, rhinorrhea and sneezing.   Eyes: Negative.   Respiratory: Positive for shortness of breath. Negative for cough and chest tightness.   Cardiovascular: Positive for leg swelling. Negative for chest pain.  Gastrointestinal: Negative for abdominal pain, blood in stool, diarrhea, nausea and vomiting.  Genitourinary: Negative for difficulty urinating, flank pain, frequency and hematuria.  Musculoskeletal: Negative for arthralgias and back pain.  Skin: Negative for rash.  Neurological: Negative for dizziness, speech difficulty, weakness, numbness and headaches.     Physical Exam Updated Vital Signs BP 172/76   Pulse 73   Temp 98.5 F (36.9 C) (Oral)   Resp 23   Ht 5\' 8"  (1.727 m)   Wt 246 lb (111.6 kg)   SpO2 96%   BMI 37.40 kg/m   Physical Exam  Constitutional: He is oriented to person, place, and  time. He appears well-developed and well-nourished.  HENT:  Head: Normocephalic and atraumatic.  Eyes: Pupils are equal, round, and reactive to light.  Neck: Normal range of motion. Neck supple.  Cardiovascular: Normal rate, regular rhythm and normal heart sounds.   Pulmonary/Chest: Effort normal. No respiratory distress. He has no wheezes. He has rales. He exhibits no tenderness.  Abdominal: Soft. Bowel sounds are normal. There is no tenderness. There is no rebound and no guarding.  Musculoskeletal: Normal range of motion. He exhibits edema.  Lymphadenopathy:    He has no cervical adenopathy.  Neurological: He is alert and oriented to person, place, and time.  Skin: Skin is warm and dry. No rash noted.  Psychiatric: He has a normal mood and affect.     ED Treatments / Results  Labs (all labs ordered are listed, but only abnormal results are displayed) Labs Reviewed  BASIC METABOLIC PANEL - Abnormal; Notable for the following:       Result Value   CO2 21 (*)    Glucose, Bld 238 (*)    BUN 41 (*)    Creatinine, Ser 2.51 (*)    Calcium 8.5 (*)    GFR calc non Af Amer 25 (*)    GFR calc Af Amer 30 (*)    All other components within normal limits  CBC WITH DIFFERENTIAL/PLATELET - Abnormal; Notable for the following:    RBC 3.64 (*)    Hemoglobin 8.4 (*)    HCT 28.1 (*)    MCV 77.2 (*)    MCH 23.1 (*)    MCHC 29.9 (*)    RDW 21.8 (*)    Neutro Abs 8.7 (*)    All other components within normal limits  BRAIN NATRIURETIC PEPTIDE - Abnormal; Notable for the following:    B Natriuretic Peptide 415.9 (*)    All other components within normal limits  I-STAT TROPOININ, ED    EKG  EKG Interpretation  Date/Time:  Monday August 27 2015 11:10:44 EDT Ventricular Rate:  79 PR Interval:    QRS Duration: 109 QT Interval:  410 QTC Calculation: 470 R Axis:   62 Text Interpretation:  Sinus rhythm Probable left atrial enlargement Borderline repolarization abnormality Confirmed by Imad Shostak   MD, Kathlen Sakurai ([id number]) on 08/27/2015 12:41:00 PM       Radiology Dg Chest Portable 1 View  Result Date: 08/27/2015 CLINICAL DATA:  Shortness of breath starting 24 hours ago. EXAM: PORTABLE CHEST 1 VIEW COMPARISON:  08/06/2015 FINDINGS: Stable post CABG surgical changes. The cardiac silhouette is enlarged. Mediastinal contours appear intact. There is no evidence of pneumothorax. There is interstitial pulmonary edema. There has been interval development of right pleural effusion. Streaky airspace opacities in the right lower lobe likely represent atelectasis. Osseous structures are without acute abnormality. Soft tissues are grossly normal. IMPRESSION: Interval development of interstitial pulmonary edema and right pleural effusion, with probable atelectasis in the right lower lobe. Stable enlargement of the cardiac silhouette. Electronically Signed   By: Fidela Salisbury M.D.   On: 08/27/2015 11:59   Procedures Procedures (including critical care time)  Medications Ordered in ED Medications  furosemide (LASIX) injection 80 mg (80 mg Intravenous Given 08/27/15 1202)     Initial Impression / Assessment and Plan / ED Course  I have reviewed the triage vital signs and the nursing notes.  Pertinent labs & imaging results that were available during my care of the patient were reviewed by me and considered in my medical decision making (see chart for details).  Clinical Course    Patient presents with shortness of breath and signs of fluid overload. He has no ischemic changes on EKG. Her troponin is negative. His BNP is elevated. He was given a dose of IV Lasix. He's feeling a little bit better. He still has some mild tachypnea but is talking in full sentences. His oxygen saturations were initially 84% on room air better currently 96% on nasal cannula. I discussed the patient with the family practice resident who will admit the patient for further treatment.  Final Clinical Impressions(s) / ED  Diagnoses   Final diagnoses:  Acute on chronic systolic congestive heart failure [facility])    New Prescriptions New Prescriptions   No medications on file     Malvin Johns, MD 08/27/15 1307

## 2015-08-28 DIAGNOSIS — I129 Hypertensive chronic kidney disease with stage 1 through stage 4 chronic kidney disease, or unspecified chronic kidney disease: Secondary | ICD-10-CM

## 2015-08-28 DIAGNOSIS — I5033 Acute on chronic diastolic (congestive) heart failure: Secondary | ICD-10-CM

## 2015-08-28 DIAGNOSIS — R0600 Dyspnea, unspecified: Secondary | ICD-10-CM | POA: Diagnosis present

## 2015-08-28 DIAGNOSIS — N184 Chronic kidney disease, stage 4 (severe): Secondary | ICD-10-CM

## 2015-08-28 LAB — BASIC METABOLIC PANEL
ANION GAP: 8 (ref 5–15)
BUN: 37 mg/dL — ABNORMAL HIGH (ref 6–20)
CHLORIDE: 110 mmol/L (ref 101–111)
CO2: 26 mmol/L (ref 22–32)
CREATININE: 2.36 mg/dL — AB (ref 0.61–1.24)
Calcium: 8.3 mg/dL — ABNORMAL LOW (ref 8.9–10.3)
GFR calc non Af Amer: 27 mL/min — ABNORMAL LOW (ref >60)
GFR, EST AFRICAN AMERICAN: 32 mL/min — AB (ref >60)
Glucose, Bld: 120 mg/dL — ABNORMAL HIGH (ref 65–99)
POTASSIUM: 2.9 mmol/L — AB (ref 3.5–5.1)
SODIUM: 144 mmol/L (ref 135–145)

## 2015-08-28 LAB — CBC
HEMATOCRIT: 26.8 % — AB (ref 39.0–52.0)
HEMOGLOBIN: 7.6 g/dL — AB (ref 13.0–17.0)
MCH: 22.4 pg — AB (ref 26.0–34.0)
MCHC: 28.4 g/dL — ABNORMAL LOW (ref 30.0–36.0)
MCV: 78.8 fL (ref 78.0–100.0)
PLATELETS: 153 10*3/uL (ref 150–400)
RBC: 3.4 MIL/uL — AB (ref 4.22–5.81)
RDW: 22.1 % — ABNORMAL HIGH (ref 11.5–15.5)
WBC: 7.2 10*3/uL (ref 4.0–10.5)

## 2015-08-28 LAB — GLUCOSE, CAPILLARY
GLUCOSE-CAPILLARY: 214 mg/dL — AB (ref 65–99)
GLUCOSE-CAPILLARY: 96 mg/dL (ref 65–99)
Glucose-Capillary: 108 mg/dL — ABNORMAL HIGH (ref 65–99)
Glucose-Capillary: 160 mg/dL — ABNORMAL HIGH (ref 65–99)

## 2015-08-28 MED ORDER — POTASSIUM CHLORIDE CRYS ER 20 MEQ PO TBCR
40.0000 meq | EXTENDED_RELEASE_TABLET | Freq: Two times a day (BID) | ORAL | Status: AC
Start: 2015-08-28 — End: 2015-08-28
  Administered 2015-08-28 (×2): 40 meq via ORAL
  Filled 2015-08-28 (×2): qty 2

## 2015-08-28 MED ORDER — FUROSEMIDE 10 MG/ML IJ SOLN
60.0000 mg | Freq: Two times a day (BID) | INTRAMUSCULAR | Status: DC
  Administered 2015-08-28 – 2015-08-29 (×2): 60 mg via INTRAVENOUS
  Filled 2015-08-28 (×2): qty 6

## 2015-08-28 NOTE — Progress Notes (Signed)
I stopped by to see patient and provide HF Education.  He was asleep.  I will return at a later time to continue educate regarding HF recommendations for home.

## 2015-08-28 NOTE — Consult Note (Signed)
Advanced Heart Failure Team Consult Note  Referring Physician: Dr Andria Frames Primary Physician: Dr Nani Ravens  Primary Cardiologist:  Dr Margaret Pyle Nephrology: Dr Marval Regal  Reason for Consultation: A/C Diastolic Heart Failure   HPI:   Leonard [name] is a 65 year old with a history of DM type II,Diastolic CHF with preserved EF, CAD S/P CABG 2015, CKD IV, hypothyroidism, resistant hypertension, PAD s/p right 4th and 5th digit amputation, COPD admitted with increased dyspnea.   In 2015 he was test for sleep apnea but did not qualify for CPAP.   Admitted 5 times in the last 6 months with volume overload/HF. Admitted earlier this month with volume overload. Diuresed with IV lasix and transitioned to po torsemide 60 mg daily but he has been taking lasix 80 mg twice a day.  Discharge weight was 226 pounds. Discharged with HH --> [facility].     Says he thinks his weight has been going up at home. Does not weigh daily. SOB with exertion. Drinks lots fluid. Medications set up by [facility] Nurse. Says he can read but was not able to recall medications.Poor insight into his heart condition, could not even remember that he had CABG in 2015. Followed by [facility]. Lives at home with sister. Currently unemployed.   Admitted with increased SOB. CXR with pulmonary edema and R pleural effusion.  Weight had gone up from 226>246 pounds.  Creatinine on admit 2.5. Troponin 0.01. No CP. He has been diuresing with IV lasix. Negative 1.3 liters. Remains SOB with exertion and complaining about lower extremity edema.   07/2015 ECHO EF 60-65% Grade II DD   Review of Systems: [y] = yes, [ ]  = no   General: Weight gain [ Y]; Weight loss [ ] ; Anorexia [ ] ; Fatigue [ ] ; Fever [ ] ; Chills [ ] ; Weakness [Y ]  Cardiac: Chest pain/pressure [ ] ; Resting SOB [ ] ; Exertional SOB [Y ]; Orthopnea [Y ]; Pedal Edema [Y ]; Palpitations [ ] ; Syncope [ ] ; Presyncope [ ] ; Paroxysmal nocturnal dyspnea[ ]   Pulmonary: Cough [ ] ; Wheezing[ ] ; Hemoptysis[ ] ; Sputum  [ ] ; Snoring [ ]   GI: Vomiting[ ] ; Dysphagia[ ] ; Melena[ ] ; Hematochezia [ ] ; Heartburn[ ] ; Abdominal pain [ ] ; Constipation [ ] ; Diarrhea [ ] ; BRBPR [ ]   GU: Hematuria[ ] ; Dysuria [ ] ; Nocturia[ ]   Vascular: Pain in legs with walking [Y ]; Pain in feet with lying flat [ ] ; Non-healing sores [ ] ; Stroke [ ] ; TIA [ ] ; Slurred speech [ ] ;  Neuro: Headaches[ ] ; Vertigo[ ] ; Seizures[ ] ; Paresthesias[ ] ;Blurred vision [ ] ; Diplopia [ ] ; Vision changes [ ]   Ortho/Skin: Arthritis [ ] ; Joint pain [Y ]; Muscle pain [ ] ; Joint swelling [ ] ; Back Pain [ ] ; Rash [ ]   Psych: Depression[ ] ; Anxiety[ ]   Heme: Bleeding problems [ ] ; Clotting disorders [ ] ; Anemia [Y ]  Endocrine: Diabetes [Y ]; Thyroid dysfunction[Y ]  Home Medications Prior to Admission medications   Medication Sig Start Date End Date Taking? Authorizing Provider  acetaminophen (TYLENOL) 500 MG tablet Take 1 tablet (500 mg total) by mouth every 6 (six) hours as needed for mild pain or moderate pain. 06/06/15  Yes Mercy Riding, MD  ADVAIR DISKUS 250-50 MCG/DOSE AEPB INHALE 1 PUFF INTO THE LUNGS 2 TIMES DAILY 06/25/15  Yes Rosemarie Ax, MD  albuterol (PROVENTIL HFA;VENTOLIN HFA) 108 (90 Base) MCG/ACT inhaler Inhale 2 puffs into the lungs every 6 (six) hours as needed for wheezing or shortness of  breath. 06/12/15  Yes Hillary Corinda Gubler, MD  amLODipine (NORVASC) 10 MG tablet Take 1 tablet (10 mg total) by mouth daily. 08/09/15  Yes Vivi Barrack, MD  aspirin EC 81 MG EC tablet Take 1 tablet (81 mg total) by mouth daily. 06/06/15  Yes Mercy Riding, MD  atorvastatin (LIPITOR) 40 MG tablet TAKE 1 TABLET BY MOUTH EVERY DAY AT 6 PM 08/09/15  Yes Vivi Barrack, MD  carvedilol (COREG) 25 MG tablet Take 2 tablets (50 mg total) by mouth 2 (two) times daily with a meal. 08/09/15  Yes Vivi Barrack, MD  doxazosin (CARDURA) 2 MG tablet TAKE ONE TABLET BY MOUTH EVERY TWELVE HOURS 08/16/15  Yes [name], MD  furosemide (LASIX) 80 MG tablet Take 1  tablet (80 mg total) by mouth 2 (two) times daily. 08/15/15  Yes Nicolette Bang, DO  hydrALAZINE (APRESOLINE) 100 MG tablet TAKE 1 TABLET (100MG ) BY MOUTH EVERY 8 HOURS 07/31/15  Yes Rosemarie Ax, MD  insulin aspart (NOVOLOG FLEXPEN) 100 UNIT/ML FlexPen Inject 35 Units into the skin 3 (three) times daily with meals. 08/09/15  Yes Vivi Barrack, MD  Insulin Glargine (LANTUS SOLOSTAR) 100 UNIT/ML Solostar Pen Inject 30 Units into the skin daily. 08/09/15  Yes Vivi Barrack, MD  isosorbide mononitrate (IMDUR) 60 MG 24 hr tablet TAKE ONE (1) TABLET BY MOUTH EVERY DAY 08/02/15  Yes Rosemarie Ax, MD  levothyroxine (SYNTHROID, LEVOTHROID) 50 MCG tablet Take 1 tablet (50 mcg total) by mouth daily before breakfast. 06/22/15  Yes Sela Hua, MD  nitroGLYCERIN (NITROSTAT) 0.4 MG SL tablet Place 1 tablet (0.4 mg total) under the tongue every 5 (five) minutes as needed for chest pain. 06/06/15  Yes Mercy Riding, MD  pantoprazole (PROTONIX) 40 MG tablet Take 1 tablet (40 mg total) by mouth daily. 08/09/15  Yes Vivi Barrack, MD  SURE COMFORT PEN NEEDLES 31G X 8 MM MISC USE WITH BOTH INSULIN PENS TWICE DAILY (4 PER DAY) 06/14/15  Yes Olin Hauser, DO    Past Medical History: Past Medical History:  Diagnosis Date  . CHF (congestive heart failure) ([place])   . Gangrene of toe ([place])    right 5th/notes 01/04/2013   . High cholesterol   . Hypertension   . Iron deficiency anemia 08/03/2015  . 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 PAD (peripheral artery disease) ([place])   . Shortness of breath dyspnea   . Type II diabetes mellitus ([place])     Past Surgical History: Past Surgical History:  Procedure Laterality Date  . AMPUTATION Right 01/28/2013   Procedure: RAY AMPUTATION RIGHT  4th & 5th TOE;  Surgeon: Rosetta Posner, MD;  Location[facility] OR;  Service: Vascular;  Laterality: Right;  . ANGIOPLASTY / STENTING FEMORAL Right 01/13/2013   superficial femoral artery x 2 (3  mm x 60 mm, 4 mm x 60 mm)  . CORONARY ARTERY BYPASS GRAFT N/A 04/07/2013   Procedure: CORONARY ARTERY BYPASS GRAFTING (CABG);  Surgeon: Ivin Poot, MD;  Location: [place];  Service: Open Heart Surgery;  Laterality: N/A;  . INTRAOPERATIVE TRANSESOPHAGEAL ECHOCARDIOGRAM N/A 04/07/2013   Procedure: INTRAOPERATIVE TRANSESOPHAGEAL ECHOCARDIOGRAM;  Surgeon: Ivin Poot, MD;  Location: [place];  Service: Open Heart Surgery;  Laterality: N/A;  . LEFT HEART CATHETERIZATION WITH CORONARY ANGIOGRAM N/A 04/05/2013   Procedure: LEFT HEART CATHETERIZATION WITH CORONARY ANGIOGRAM;  Surgeon: Peter M Martinique, MD;  Location: [facility] CATH LAB;  Service: Cardiovascular;  Laterality: N/A;  . LOWER EXTREMITY ANGIOGRAM Bilateral 01/06/2013   Procedure: LOWER EXTREMITY ANGIOGRAM;  Surgeon: Conrad Wymore, MD;  Location: [facility] CATH LAB;  Service: Cardiovascular;  Laterality: Bilateral;  . LOWER EXTREMITY ANGIOGRAM Right 01/13/2013   Procedure: LOWER EXTREMITY ANGIOGRAM;  Surgeon: Conrad Shiloh, MD;  Location: [facility] CATH LAB;  Service: Cardiovascular;  Laterality: Right;  . SKIN CANCER EXCISION  2017  . THROAT SURGERY  1983   polyps  . TONSILLECTOMY AND ADENOIDECTOMY  ~ 1965    Family History: Family History  Problem Relation Age of Onset  . Cancer Mother     lung cancer  . Hypertension Mother   . Cancer Sister     patient thinks it was uterine cancer  . Hypertension Sister   . Heart attack Sister   . Stroke Neg Hx     Social History: Social History   Social History  . Marital status: Single    Spouse name: N/A  . Number of children: N/A  . Years of education: N/A   Social History Main Topics  . Smoking status: Former Smoker    Packs/day: 1.00    Years: 46.00    Types: Cigarettes    Start date: 02/04/1967    Quit date: 02/03/2013  . Smokeless tobacco: Never Used     Comment: Doing well with quitting.  . Alcohol use No  . Drug use: No  . Sexual activity: Not Currently   Other Topics Concern  . None   Social  History Narrative   Lives with sister, Leonard [name] in [place].  Retired - former Sports coach.    Allergies:  No Known Allergies  Objective:    Vital Signs:   Temp:  [97.9 F (36.6 C)-98.5 F (36.9 C)] 98.3 F (36.8 C) (07/25 1137) Pulse Rate:  [65-80] 65 (07/25 1137) Resp:  [18-21] 20 (07/25 1137) BP: (147-170)/(58-83) 147/58 (07/25 1137) SpO2:  [93 %-98 %] 94 % (07/25 1137) Weight:  [108 kg (238 lb 3.2 oz)-109.9 kg (242 lb 4.8 oz)] 108 kg (238 lb 3.2 oz) (07/25 0354) Last BM Date: 08/26/15  Weight change: Filed Weights   08/27/15 1206 08/27/15 1420 08/28/15 0354  Weight: 111.6 kg (246 lb) 109.9 kg (242 lb 4.8 oz) 108 kg (238 lb 3.2 oz)    Intake/Output:   Intake/Output Summary (Last 24 hours) at 08/28/15 1349 Last data filed at 08/28/15 1000  Gross per 24 hour  Intake              360 ml  Output             1950 ml  Net            -1590 ml     Physical Exam: General:  Elderly appearing. No resp difficulty. In bed  HEENT: normal x for poor dentition Neck: supple. JVP to jaw. Carotids 2+ bilat; no bruits. No lymphadenopathy or thryomegaly appreciated. Cor: PMI nondisplaced. Regular rate & rhythm. No rubs or murmurs.+s4 Lungs: Decreased in the bases. On 2 liters CN  Abdomen: soft, nontender, + distended. No hepatosplenomegaly. No bruits or masses. Good bowel sounds. Extremities: no cyanosis, clubbing, rash, R and LLE 2+ edema Neuro: alert & orientedx3, cranial nerves grossly intact. moves all 4 extremities w/o difficulty. Affect pleasant  Telemetry: NSR 70s (personally reviewed)  Labs: Basic Metabolic Panel:  Recent Labs Lab 08/27/15 1120 08/28/15 0524  NA 142 144  K 3.5 2.9*  CL 110 110  CO2  21* 26  GLUCOSE 238* 120*  BUN 41* 37*  CREATININE 2.51* 2.36*  CALCIUM 8.5* 8.3*    Liver Function Tests: No results for input(s): AST, ALT, ALKPHOS, BILITOT, PROT, ALBUMIN in the last 168 hours. No results for input(s): LIPASE, AMYLASE in the last 168  hours. No results for input(s): AMMONIA in the last 168 hours.  CBC:  Recent Labs Lab 08/27/15 1120 08/28/15 0524  WBC 10.3 7.2  NEUTROABS 8.7*  --   HGB 8.4* 7.6*  HCT 28.1* 26.8*  MCV 77.2* 78.8  PLT 177 153    Cardiac Enzymes: No results for input(s): CKTOTAL, CKMB, CKMBINDEX, TROPONINI in the last 168 hours.  BNP: BNP (last 3 results)  Recent Labs  06/28/15 0320 08/06/15 1736 08/27/15 1120  BNP 467.1* 285.9* 415.9*    ProBNP (last 3 results) No results for input(s): PROBNP in the last 8760 hours.   CBG:  Recent Labs Lab 08/27/15 1644 08/27/15 2113 08/28/15 0628 08/28/15 1136  GLUCAP 190* 130* 108* 160*    Coagulation Studies: No results for input(s): LABPROT, INR in the last 72 hours.  Other results: EKG: NSR 87 bpm   Imaging: Dg Chest Portable 1 View  Result Date: 08/27/2015 CLINICAL DATA:  Shortness of breath starting 24 hours ago. EXAM: PORTABLE CHEST 1 VIEW COMPARISON:  08/06/2015 FINDINGS: Stable post CABG surgical changes. The cardiac silhouette is enlarged. Mediastinal contours appear intact. There is no evidence of pneumothorax. There is interstitial pulmonary edema. There has been interval development of right pleural effusion. Streaky airspace opacities in the right lower lobe likely represent atelectasis. Osseous structures are without acute abnormality. Soft tissues are grossly normal. IMPRESSION: Interval development of interstitial pulmonary edema and right pleural effusion, with probable atelectasis in the right lower lobe. Stable enlargement of the cardiac silhouette. Electronically Signed   By: Fidela Salisbury M.D.   On: 08/27/2015 11:59     Medications:     Current Medications: . amLODipine  10 mg Oral Daily  . aspirin EC  81 mg Oral Daily  . atorvastatin  40 mg Oral Daily  . carvedilol  50 mg Oral BID WC  . doxazosin  2 mg Oral Q12H  . enoxaparin (LOVENOX) injection  40 mg Subcutaneous Q24H  . fluticasone  furoate-vilanterol  1 puff Inhalation Daily  . furosemide  60 mg Intravenous BID  . hydrALAZINE  100 mg Oral Q8H  . insulin aspart  0-15 Units Subcutaneous TID WC  . insulin aspart  0-5 Units Subcutaneous QHS  . insulin aspart  20 Units Subcutaneous TID WC  . insulin glargine  30 Units Subcutaneous QHS  . isosorbide mononitrate  60 mg Oral Daily  . levothyroxine  50 mcg Oral QAC breakfast  . pantoprazole  40 mg Oral Daily  . potassium chloride  40 mEq Oral BID     Infusions:      Assessment/Plan   Leonard Saltzman is admitted with A/C diastolic heart failure complicated by CKD. Admitted several times over the last few months.    1. Acute on Chronic Diastolic CHF - Echo [id number] LVEF 60-65% with grade 2 DD and normal RV - Volume overloaded. Increase lasix to 80 mg IV BID.  Follow creatinine closely with CKD.  2. HTN .  - Hypertensive on admit. A little better this afternoon.  Continue current regimen. May need to add clonidine.   - 06/2015 Renal US with left renal cortical irregularity (possibly scarring) and 2.5 cm simple cyst, but otherwise unremarkable. - 06/2015  Renal artery dopplers- No renal artery stenosis.  3. Hypothyroidism- TSH 12.559 on 06/18/15. On Synthroid.  4. CKD Stage IV.  Marland KitchenCreatinine  Follow closely with diuresis. Followed in community by Dr Marval Regal  5. CAD s/p CABG 04/2013 - No CP. Continue ASA and Statin. -Continue Imdur 60 mg daily.  6. Mild OSA - AHI 4.6 in 11/2013. Did not qualify for CPAP titration. 7. PAD s/p stent to right femoral artery 01/2013.  Large pressure differential between arms and legs => will get peripheral arterial doppler evaluation.  8. HLD - Continue atorvastatin 7. COPD - Per primary  Will need Paramedicine once discharged. I have referred. I will follow up with [facility]. He will tele-monitoring as well as the diuretic protocol. He will need close follow up when discharged. He is at high risk for readmits due to poor insight.     Length of  Stay: 1  Amy Clegg NP-C  08/28/2015, 1:49 PM  Advanced Heart Failure Team Pager [phone] (M-F; 7a - 4p)  Please contact [facility] for night-coverage after hours (4p -7a ) and weekends on [email or web address]  Patient seen and examined with Leonard Grinder, NP. We discussed all aspects of the encounter. I agree with the assessment and plan as stated above. He has very poor insight into his cardiac issues. Could not even remember that he had CABG 2015. Has had multiple admits for HF in last few months. Has severe HTN and advanced CKD drinks excessive amounts of fluid. It will be very difficult to keep him out of hospital.   Will continue IV diuresis. Adjust anti-HTN meds as needed. Agree with switching to torsemide on d/c. Will also need ongoing f/u from [facility] and will refer to Paramedicine. CAD appears stable. No evidence ischemia.   We will follow.   Bensimhon, Daniel,MD 3:11 PM

## 2015-08-28 NOTE — Progress Notes (Signed)
[facility]  Patient Status: Active (receiving services up to time of hospitalization)  [facility] is providing the following services: RN  If patient discharges after hours, please call [phone].   Leonard [name] 08/28/2015, 10:13 AM

## 2015-08-28 NOTE — Progress Notes (Signed)
Family Medicine Teaching Service Daily Progress Note Intern Pager: [phone]  [name] Medical record number: [id number] Date of birth: [birth year] Age: 65 y.o. Gender: male  Primary Care Provider: [name], MD Consultants: Heart failure team Code Status: Full  Pt Overview and Major Events to Date:  Diuresis  Assessment and Plan: Leonard Snouffer Poseyis a 65 y.o.malepresenting with shortness of breath. 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  Acute hypoxic respiratory failure 2/2 to CHF exacerbationlikely 2/2 CHF exacerbation. Patient diuresed well in the past 24hr.-1360 on100 lasix. However patient is still volume overloaded. Crackles are heard on chest exam and patient still on 3L.  -- Daily weights, I/Os -- Will give IV lasix 60mg  BID -- Replete K -- Continue supplemental O2 as needed   COPD, not currently in exacerbation. Not currently on home O2, had previous home2L [facility] but it was discontinued about 6 months ago.COPD exacerbation unlikely dyspnea more likely 2/2 CHF than COPD exacerbation.  - Continue albuterol 2 puff q6 prnand home Dulera - O2 to keep sats above 90%, Wean off as tolerated - Follow up with PCP to reassess need for home O2 - Consider low dose lung CT if no improvement given smoking history. Consider as outpatient for lung cancer screening at discharge.  Hypertension, chronic. Hypertensive at 170/83 on admission. Patient's BP continue to be elevated this morning. Continue current home regimen, most likely will improved with diuresis.   --Amlodipine 10 mg po once daily --Carvedilol 50mg  po BID --Doxazosin 2mg  po q12 --Hydralazine 100mg  po q8   DM type IIAt home, takes 65 units of lantus bidand 35 units of novolog tid.Patient po intake have decreased with hospitalization and insulin need will  be readjusted in consequence. Last A1C 10.3 in 4/30,2017, next due on 09/02/15. CBG 238 on admission.  - While admitted lantus 20U aspart 20 units 3 times with meals along with moderate Insulin Sliding ScaleGlargine 30 units, 2 times daily   CKD III/IV, stablecreatinine was 2.51on admission, at baseline. Creatinine this morning 2.31 despite lasix,. Followed by nephrologist as outpatient. - outpatient follow up with nephrologist - Continue to monitor Cr while diuresing  HypothyroidismTSH in May 2017 was elevated to 12.559.Synthroid recently started in 06/2015. -Levothyroxine 55mcg po once daily -Recommend outpatient f/u with repeat TSH  HyperlipidemiaWell controlled, will continue home regimen. - continue home Atorvastatin 40 mg once daily   FEN/GI: fluid restricted, renal diet/heart healthy  PPx: Pepcid  Disposition: home pending medical management  Subjective:  Patient feeling slightly better than on admission day. He says breathing has improved. He denies nausea, vomiting, abdominal pain, fever, chills. Patient has good appetite.  Objective: Temp:  [97.9 F (36.6 C)-98.5 F (36.9 C)] 98.3 F (36.8 C) (07/25 1137) Pulse Rate:  [65-80] 65 (07/25 1137) Resp:  [16-21] 20 (07/25 1137) BP: (147-186)/(58-83) 147/58 (07/25 1137) SpO2:  [93 %-98 %] 94 % (07/25 1137) Weight:  [238 lb 3.2 oz (108 kg)-242 lb 4.8 oz (109.9 kg)] 238 lb 3.2 oz (108 kg) (07/25 0354) Physical Exam: General: Laying in bed with head of bed at 45 degrees, appears SOB, in NAD Eyes: EOMI, PERRL ENTM: MMM Cardiovascular: RRR, no murmurs appreciated Respiratory: CTAB, normal effort Abdomen: soft, nt, nd, +bs MSK: moving limbs spontaneously Skin: no rashes noted Neuro: A&Ox3, no focal deficits Psych: appropriate affectLaboratory:  Recent Labs Lab 08/27/15 1120 08/28/15 0524  WBC  10.3 7.2  HGB 8.4* 7.6*  HCT 28.1* 26.8*  PLT 177 153    Recent Labs Lab 08/27/15 1120 08/28/15 0524  NA 142  144  K 3.5 2.9*  CL 110 110  CO2 21* 26  BUN 41* 37*  CREATININE 2.51* 2.36*  CALCIUM 8.5* 8.3*  GLUCOSE 238* 120*   BNP 415.9  Imaging/Diagnostic Tests: Dg Chest Portable 1 View  Result Date: 08/27/2015 CLINICAL DATA:  Shortness of breath starting 24 hours ago. EXAM: PORTABLE CHEST 1 VIEW COMPARISON:  08/06/2015 FINDINGS: Stable post CABG surgical changes. The cardiac silhouette is enlarged. Mediastinal contours appear intact. There is no evidence of pneumothorax. There is interstitial pulmonary edema. There has been interval development of right pleural effusion. Streaky airspace opacities in the right lower lobe likely represent atelectasis. Osseous structures are without acute abnormality. Soft tissues are grossly normal. IMPRESSION: Interval development of interstitial pulmonary edema and right pleural effusion, with probable atelectasis in the right lower lobe. Stable enlargement of the cardiac silhouette. Electronically Signed   By: Fidela Salisbury M.D.   On: 08/27/2015 11:59  Marjie Skiff, MD 08/28/2015, 1:05 PM PGY-1, [place] Intern pager: [phone], text pages welcome

## 2015-08-29 DIAGNOSIS — Z87891 Personal history of nicotine dependence: Secondary | ICD-10-CM

## 2015-08-29 DIAGNOSIS — R0602 Shortness of breath: Secondary | ICD-10-CM

## 2015-08-29 DIAGNOSIS — I10 Essential (primary) hypertension: Secondary | ICD-10-CM

## 2015-08-29 DIAGNOSIS — Z951 Presence of aortocoronary bypass graft: Secondary | ICD-10-CM

## 2015-08-29 DIAGNOSIS — I2510 Atherosclerotic heart disease of native coronary artery without angina pectoris: Secondary | ICD-10-CM

## 2015-08-29 DIAGNOSIS — I5023 Acute on chronic systolic (congestive) heart failure: Secondary | ICD-10-CM

## 2015-08-29 DIAGNOSIS — I739 Peripheral vascular disease, unspecified: Secondary | ICD-10-CM

## 2015-08-29 LAB — GLUCOSE, CAPILLARY
GLUCOSE-CAPILLARY: 181 mg/dL — AB (ref 65–99)
GLUCOSE-CAPILLARY: 162 mg/dL — AB (ref 65–99)
GLUCOSE-CAPILLARY: 122 mg/dL — AB (ref 65–99)
Glucose-Capillary: 126 mg/dL — ABNORMAL HIGH (ref 65–99)

## 2015-08-29 LAB — BASIC METABOLIC PANEL
Anion gap: 7 (ref 5–15)
BUN: 39 mg/dL — AB (ref 6–20)
CHLORIDE: 110 mmol/L (ref 101–111)
CO2: 24 mmol/L (ref 22–32)
CREATININE: 2.53 mg/dL — AB (ref 0.61–1.24)
Calcium: 8.8 mg/dL — ABNORMAL LOW (ref 8.9–10.3)
GFR calc Af Amer: 29 mL/min — ABNORMAL LOW (ref >60)
GFR calc non Af Amer: 25 mL/min — ABNORMAL LOW (ref >60)
GLUCOSE: 131 mg/dL — AB (ref 65–99)
POTASSIUM: 3.8 mmol/L (ref 3.5–5.1)
Sodium: 141 mmol/L (ref 135–145)

## 2015-08-29 LAB — CBC
HEMATOCRIT: 31.6 % — AB (ref 39.0–52.0)
HEMOGLOBIN: 9.1 g/dL — AB (ref 13.0–17.0)
MCH: 22.9 pg — ABNORMAL LOW (ref 26.0–34.0)
MCHC: 28.8 g/dL — ABNORMAL LOW (ref 30.0–36.0)
MCV: 79.4 fL (ref 78.0–100.0)
Platelets: 153 10*3/uL (ref 150–400)
RBC: 3.98 MIL/uL — AB (ref 4.22–5.81)
RDW: 22.1 % — ABNORMAL HIGH (ref 11.5–15.5)
WBC: 4.8 10*3/uL (ref 4.0–10.5)

## 2015-08-29 LAB — MAGNESIUM: MAGNESIUM: 2.2 mg/dL (ref 1.7–2.4)

## 2015-08-29 MED ORDER — POTASSIUM CHLORIDE CRYS ER 20 MEQ PO TBCR
40.0000 meq | EXTENDED_RELEASE_TABLET | Freq: Once | ORAL | Status: AC
  Administered 2015-08-29: 40 meq via ORAL
  Filled 2015-08-29: qty 2

## 2015-08-29 MED ORDER — POTASSIUM CHLORIDE CRYS ER 20 MEQ PO TBCR: 40.0000 meq | EXTENDED_RELEASE_TABLET | Freq: Two times a day (BID) | ORAL | Status: DC

## 2015-08-29 MED ORDER — POTASSIUM CHLORIDE CRYS ER 20 MEQ PO TBCR
20.0000 meq | EXTENDED_RELEASE_TABLET | Freq: Two times a day (BID) | ORAL | Status: DC
  Administered 2015-08-29 – 2015-09-02 (×8): 20 meq via ORAL
  Filled 2015-08-29 (×9): qty 1

## 2015-08-29 MED ORDER — FUROSEMIDE 10 MG/ML IJ SOLN
80.0000 mg | Freq: Two times a day (BID) | INTRAMUSCULAR | Status: DC
  Administered 2015-08-29 – 2015-09-01 (×6): 80 mg via INTRAVENOUS
  Filled 2015-08-29 (×6): qty 8

## 2015-08-29 MED ORDER — HYDRALAZINE HCL 20 MG/ML IJ SOLN: 10.0000 mg | INTRAMUSCULAR | Status: DC | PRN

## 2015-08-29 MED ORDER — METOLAZONE 2.5 MG PO TABS
2.5000 mg | ORAL_TABLET | Freq: Once | ORAL | Status: AC
  Administered 2015-08-29: 2.5 mg via ORAL
  Filled 2015-08-29: qty 1

## 2015-08-29 MED ORDER — POLYETHYLENE GLYCOL 3350 17 G PO PACK: 17.0000 g | PACK | Freq: Every day | ORAL | Status: DC

## 2015-08-29 MED ORDER — FUROSEMIDE 10 MG/ML IJ SOLN
120.0000 mg | Freq: Once | INTRAVENOUS | Status: DC
  Filled 2015-08-29: qty 12

## 2015-08-29 NOTE — Evaluation (Signed)
Physical Therapy Evaluation Patient Details Name: Leonard [name] MRN: [id number] DOB: 1951-02-02 Today's Date: 08/29/2015   History of Present Illness  65 yo M with history of CKD, CAD, diastolic CHF, type 2 diabetes, hyperlipidemia, PVD, COPD, CABG x 3 2015,  hypertension admitted with dyspnea and hypoxia. Dx of CHF exacerbation.   Clinical Impression  Pt admitted with above diagnosis. Patient with dyspnea and desaturated to 84% on 3L [place] O2 after walking 20 ft. Standing rest with pursed lip breathing only incr O2 to 88%; incr to 4L with no change. Returned pt to room. Pt demonstrated decr safety with RW (?inattention due to dyspnea?). Pt currently with functional limitations due to the deficits listed below (see PT Problem List).  Pt will benefit from skilled PT to increase their independence and safety with mobility to allow discharge to the venue listed below.       Follow Up Recommendations No PT follow up;Supervision for mobility/OOB    Equipment Recommendations  None recommended by PT    Recommendations for Other Services       Precautions / Restrictions Precautions Precautions: Fall      Mobility  Bed Mobility                  Transfers Overall transfer level: Needs assistance Equipment used: Rolling walker (2 wheeled) Transfers: Sit to/from Stand Sit to Stand: Min guard         General transfer comment: vc for safe use of RW during transition  Ambulation/Gait Ambulation/Gait assistance: Min assist Ambulation Distance (Feet): 40 Feet Assistive device: Rolling walker (2 wheeled) Gait Pattern/deviations: Step-through pattern;Decreased stride length;Trunk flexed   Gait velocity interpretation: Below normal speed for age/gender General Gait Details: weak with incr dyspnea; unsafe use of RW in crowded area of his room  Stairs            Wheelchair Mobility    Modified Rankin (Stroke Patients Only)       Balance Overall balance assessment:  Needs assistance Sitting-balance support: No upper extremity supported;Feet supported Sitting balance-Leahy Scale: Good     Standing balance support: No upper extremity supported Standing balance-Leahy Scale: Fair                               Pertinent Vitals/Pain Pain Assessment: No/denies pain    Home Living Family/patient expects to be discharged to:: Private residence Living Arrangements: Other relatives (sister) Available Help at Discharge: Family;Available 24 hours/day Type of Home: Apartment Home Access: Level entry     Home Layout: One level Home Equipment: Clinical cytogeneticist - 2 wheels;Adaptive equipment;Cane - quad      Prior Function Level of Independence: Needs assistance   Gait / Transfers Assistance Needed: modified independent with RW  ADL's / Homemaking Assistance Needed: Pt has aid 3x week to assist with shower.  Pt is able to assist with bathing but fatigues so quickly with bathing and dressing that often times cannot complete the tasks before lunch time. Sister cooks and Armed forces logistics/support/administrative officer Dominance   Dominant Hand: Right    Extremity/Trunk Assessment   Upper Extremity Assessment: Overall WFL for tasks assessed           Lower Extremity Assessment: Overall WFL for tasks assessed      Cervical / Trunk Assessment: Normal  Communication   Communication: No difficulties  Cognition Arousal/Alertness: Awake/alert Behavior During Therapy: WFL for  tasks assessed/performed Overall Cognitive Status: Within Functional Limits for tasks assessed                      General Comments      Exercises        Assessment/Plan    PT Assessment Patient needs continued PT services  PT Diagnosis Difficulty walking   PT Problem List Decreased activity tolerance;Decreased balance;Decreased mobility;Decreased knowledge of use of DME;Decreased safety awareness;Cardiopulmonary status limiting activity  PT Treatment  Interventions DME instruction;Gait training;Functional mobility training;Therapeutic activities;Patient/family education   PT Goals (Current goals can be found in the Care Plan section) Acute Rehab PT Goals Patient Stated Goal: feel better PT Goal Formulation: With patient Time For Goal Achievement: 09/05/15 Potential to Achieve Goals: Good    Frequency Min 3X/week   Barriers to discharge        Co-evaluation               End of Session Equipment Utilized During Treatment: Gait belt;Oxygen Activity Tolerance: Treatment limited secondary to medical complications (Comment) (desaturated) Patient left: in chair;with call bell/phone within reach;with chair alarm set;with nursing/sitter in room Nurse Communication: Mobility status;Other (comment) (desaturation on 4L)         Time: [id number] PT Time Calculation (min) (ACUTE ONLY): 18 min   Charges:   PT Evaluation $PT Eval Low Complexity: 1 Procedure     PT G Codes:        Rande Roylance 09/28/2015, 1:41 PM Pager [phone]

## 2015-08-29 NOTE — Progress Notes (Signed)
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    Past Medical History:  Diagnosis Date  . CHF (congestive heart failure) ([place])   . Gangrene of toe ([place])    right 5th/notes 01/04/2013   . High cholesterol   . Hypertension   . Iron deficiency anemia 08/03/2015  . 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 PAD (peripheral artery disease) ([place])   . Shortness of breath dyspnea   . Type II diabetes mellitus ([place])     Social History   Social History  . Marital status: Single    Spouse name: N/A  . Number of children: N/A  . Years of education: N/A   Social History Main Topics  . Smoking status: Former Smoker    Packs/day: 1.00    Years: 46.00    Types: Cigarettes    Start date: 02/04/1967    Quit date: 02/03/2013  . Smokeless tobacco: Never Used     Comment: Doing well with quitting.  . Alcohol use No  . Drug use: No  . Sexual activity: Not Currently   Other Topics Concern  . None   Social History Narrative   Lives with sister, Leonard [name] in [place].  Retired - former Sports coach.    ECHO:Study Conclusions-06/05/15  - Left ventricle: The cavity size was normal. Systolic function was   normal. The estimated ejection fraction was in the range of 60%   to 65%. Wall motion was normal; there were no regional wall   motion abnormalities. Features are consistent with a pseudonormal   left ventricular filling pattern, with concomitant abnormal   relaxation and increased filling pressure (grade 2 diastolic   dysfunction). Doppler parameters are consistent with high   ventricular filling pressure. - Aortic valve: Transvalvular velocity was within the normal range.   There was no stenosis. There was  trivial regurgitation. - Mitral valve: Calcified annulus. There was no regurgitation. - Right ventricle: The cavity size was normal. Wall thickness was   normal. Systolic function was normal. - Atrial septum: No defect or patent foramen ovale was identified. - Tricuspid valve: There was trivial regurgitation. - Pulmonary arteries: Systolic pressure was within the normal   range. PA peak pressure: 24 mm Hg (S). - Inferior vena cava: The vessel was dilated. The respirophasic   diameter changes were in the normal range (>= 50%), consistent   with normal central venous pressure. - Pericardium, extracardiac: There was a left pleural effusion.  Transthoracic echocardiography.  M-mode, complete 2D, spectral Doppler, and color Doppler.  Birthdate:  Patient birthdate: [birth year].  Age:  Patient is 65 yr old.  Sex:  Gender: male. BMI: 36.4 kg/m^2.  Blood pressure:     151/72  Patient status: Inpatient.  Study date:  Study date: 06/05/2015. Study time: 03:38 PM.  Location:  Bedside.  BNP    Component Value Date/Time   BNP 415.9 (H) 08/27/2015 1120   BNP 181.1 (H) 11/21/2014 1532    ProBNP    Component Value Date/Time   PROBNP 2,795.0 (H) February 11, 202015 2105     Education Assessment and Provision:  Detailed education and instructions provided on heart failure disease management including the following:  Signs and symptoms of Heart Failure When to call the physician Importance of  daily weights Low sodium diet Fluid restriction Medication management Anticipated future follow-up appointments  Patient education given on each of the above topics.  Patient acknowledges understanding and acceptance of all instructions.  I spoke with [name] regarding his hospitalization and HF.  I am quite certain that I have seen and spoken to him before however cannot locate my note.  He tells me that he has a scale at home and he weighs each day.  He can also teach back when to contact the physician  related to weight increases.  He tells me that he "tries" to eat low sodium--however when pressed and asked about specific high sodium foods such as canned soups and fast food--he admits that he eats them. I encouraged 2000 mg of Sodium per day or less and reminded him that Sodium is in almost all foods.  He denies any issues getting or taking prescribed medications.  He will follow with the [facility] Clinic after discharge.    Education Materials:  "Living Better With Heart Failure" Booklet, Daily Weight Tracker Tool   High Risk Criteria for Readmission and/or Poor Patient Outcomes:  (Recommend Follow-up with Advanced Heart Failure Clinic)   EF <30%- no 60-65 with grade 2 dias dys  2 or more admissions in 6 months- yes 5/6 mo  Difficult social situation- No  Demonstrates medication noncompliance- No denies any issue    Barriers of Care:  Health Literacy, Knowledge and Compliance  Discharge Planning:   Plans to discharge to home with his sister.  He would benefit from ongoing education, compliance reinforcement and symptom recognition.  Due to his high risk for readmission I have also referred him for [employer].

## 2015-08-29 NOTE — Progress Notes (Signed)
Family Medicine Teaching Service Daily Progress Note Intern Pager: [phone]  [name] Medical record number: [id number] Date of birth: [birth year] Age: 65 y.o. Gender: male  Primary Care Provider: [name], MD Consultants: Heart failure team Code Status: Full  Pt Overview and Major Events to Date:   Assessment and Plan: Leonard [name] is a 65 y.o. male presenting with shortness of breath . 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  Acute hypoxic respiratory failure 2/2 to CHF exacerbation likely 2/2 CHF exacerbation. Patient diuresed well in the past 24hr. -1225L on 120 lasix. However patient is still volume overloaded. Crackles are heard on chest exam and patient still on 3L.  -- Daily weights, I/Os -- Will give IV lasix 80mg  BID -- Replete K -- Will get metolazone 2.5 mg per HF team -- Continue supplemental O2 as needed -- Continuous pulse ox  COPD, not currently in exacerbation. Not currently on home O2, had previous home 2L [place] but it was discontinued about 6 months ago. COPD exacerbation unlikely dyspnea more likely 2/2 CHF than COPD exacerbation.  - Continue albuterol 2 puff q6 prnand home [place] - O2 to keep sats above 90%, Wean off as tolerated - Follow up with PCP to reassess need for home O2 - Consider low dose lung CT if no improvement given smoking history. Consider as outpatient for lung cancer screening at discharge.  Hypertension, chronic. Hypertensive at 170/83 on admission. Patient's BP continue to be elevated 152/63 this morning.   --Amlodipine 10 mg po once daily --Carvedilol 50mg  po BID --Doxazosin 2mg  po q12 --Hydralazine 100mg  po q8 --Consider adding clonidine  DM type II At home, takes 65 units of lantus bid and 35 units of novolog tid. Patient po intake have decreased with hospitalization and  insulin need will be readjusted in consequence. Last A1C 10.3 in 4/30,2017, next due on 09/02/15. CBG 238 on admission.  - While admitted lantus 20U aspart 20 units 3 times with meals along with moderate Insulin Sliding Scale Glargine 30 units, 2 times daily   CKD III/IV, stable creatinine was 2.51 on admission, at baseline. Creatinine this money Followed by nephrologist as outpatient. - outpatient follow up with nephrologist - Continue to monitor Cr while diuresing  Hypothyroidism TSH in May 2017 was elevated to 12.559. Synthroid recently started in 06/2015. -Levothyroxine 64mcg po once daily -Recommend outpatient f/u with repeat TSH  Hyperlipidemia Well controlled, will continue home regimen. - continue home Atorvastatin 40 mg once daily   FEN/GI: Fluid restricted,  Renal diet/heart healthy, PPI PPx: lovenoc  Disposition: Admit to CHF  exacerbation management   Subjective:  Patient feeling better this morning, breathing has improved patient does not feel as short of breath. Patient is ea  Objective: Temp:  [98 F (36.7 C)-98.4 F (36.9 C)] 98.4 F (36.9 C) (07/26 0503) Pulse Rate:  [65-70] 70 (07/26 0503) Resp:  [16-22] 22 (07/26 0503) BP: (147-166)/(58-82) 153/68 (07/26 0503) SpO2:  [93 %-98 %] 93 % (07/26 0503) Weight:  [238 lb 12.8 oz (108.3 kg)] 238 lb 12.8 oz (108.3 kg) (07/26 0503) Physical Exam:   Laboratory:  Recent Labs Lab 08/27/15 1120 08/28/15 0524  WBC 10.3 7.2  HGB 8.4* 7.6*  HCT 28.1* 26.8*  PLT 177 153    Recent Labs Lab 08/27/15 1120 08/28/15 0524  NA 142 144  K 3.5 2.9*  CL 110  110  CO2 21* 26  BUN 41* 37*  CREATININE 2.51* 2.36*  CALCIUM 8.5* 8.3*  GLUCOSE 238* 120*   Dg Chest Portable 1 View  Result Date: 08/27/2015 CLINICAL DATA:  Shortness of breath starting 24 hours ago. EXAM: PORTABLE CHEST 1 VIEW COMPARISON:  08/06/2015 FINDINGS: Stable post CABG surgical changes. The cardiac silhouette is enlarged. Mediastinal contours  appear intact. There is no evidence of pneumothorax. There is interstitial pulmonary edema. There has been interval development of right pleural effusion. Streaky airspace opacities in the right lower lobe likely represent atelectasis. Osseous structures are without acute abnormality. Soft tissues are grossly normal. IMPRESSION: Interval development of interstitial pulmonary edema and right pleural effusion, with probable atelectasis in the right lower lobe. Stable enlargement of the cardiac silhouette. Electronically Signed   By: Fidela Salisbury M.D.   On: 08/27/2015 11:59   Marjie Skiff, MD 08/29/2015, 7:05 AM PGY-1, [place] Intern pager: [phone], text pages welcome

## 2015-08-29 NOTE — Progress Notes (Signed)
Advanced Heart Failure Rounding Note  Referring Physician: Dr Andria [name] Primary Physician: Leonard [name]  Primary Cardiologist:  Leonard [name] Nephrology: Leonard [name]  Reason for Consultation: A/C Diastolic Heart Failure   Subjective:    Still feels swollen and orthopneic.  Peeing well per patient. Denies CP, lightheadedness, or dizziness. Hasn't been up walking around.   Out 400 cc, though weight shows up. Labs pending for today.  Creatinine had been trending down.  K 2.9 yesterday, received supp.   Most recent Echo: 06/05/15 Echo LVEF 60-65%, Grade 2 DD, Normal RV, PA peak pressure 24 mm Hg  Objective:   Weight Range: 238 lb 12.8 oz (108.3 kg) Body mass index is 36.31 kg/m.   Vital Signs:   Temp:  [98 F (36.7 C)-98.4 F (36.9 C)] 98.4 F (36.9 C) (07/26 0503) Pulse Rate:  [65-70] 70 (07/26 0503) Resp:  [16-22] 22 (07/26 0503) BP: (147-166)/(58-82) 153/68 (07/26 0503) SpO2:  [93 %-97 %] 93 % (07/26 0503) Weight:  [238 lb 12.8 oz (108.3 kg)] 238 lb 12.8 oz (108.3 kg) (07/26 0503) Last BM Date: 08/26/15  Weight change: Filed Weights   08/27/15 1420 08/28/15 0354 08/29/15 0503  Weight: 242 lb 4.8 oz (109.9 kg) 238 lb 3.2 oz (108 kg) 238 lb 12.8 oz (108.3 kg)    Intake/Output:   Intake/Output Summary (Last 24 hours) at 08/29/15 0758 Last data filed at 08/29/15 0548  Gross per 24 hour  Intake             1040 ml  Output             1225 ml  Net             -185 ml     Physical Exam: General: Seated in chair, eating breakfast. NAD. HEENT: normal x for poor dentition Neck: supple. Thick, JVP difficult to assess.  Appears elevated.  jaw. Carotids 2+ bilat; no bruits. No lymphadenopathy or thryomegaly appreciated. Cor: PMI nondisplaced. RRR. No murmurs or rubs noted.+s4 Lungs: Decreased basilar sounds with scant crackles. Abdomen: Obese, soft, NT, mild/mod distention, no HSM. No bruits or masses. +BS  Extremities: no cyanosis, clubbing, rash, 1+ edema to  knees Neuro: A&Ox3, cranial nerves grossly intact. moves all 4 extremities w/o difficulty. Affect pleasant  Telemetry: Reviewed personally, NSR 60-70s.  Labs: CBC  Recent Labs  08/27/15 1120 08/28/15 0524  WBC 10.3 7.2  NEUTROABS 8.7*  --   HGB 8.4* 7.6*  HCT 28.1* 26.8*  MCV 77.2* 78.8  PLT 177 0000000   Basic Metabolic Panel  Recent Labs  08/27/15 1120 08/28/15 0524  NA 142 144  K 3.5 2.9*  CL 110 110  CO2 21* 26  GLUCOSE 238* 120*  BUN 41* 37*  CREATININE 2.51* 2.36*  CALCIUM 8.5* 8.3*   Liver Function Tests No results for input(s): AST, ALT, ALKPHOS, BILITOT, PROT, ALBUMIN in the last 72 hours. No results for input(s): LIPASE, AMYLASE in the last 72 hours. Cardiac Enzymes No results for input(s): CKTOTAL, CKMB, CKMBINDEX, TROPONINI in the last 72 hours.  BNP: BNP (last 3 results)  Recent Labs  06/28/15 0320 08/06/15 1736 08/27/15 1120  BNP 467.1* 285.9* 415.9*    ProBNP (last 3 results) No results for input(s): PROBNP in the last 8760 hours.   D-Dimer No results for input(s): DDIMER in the last 72 hours. Hemoglobin A1C No results for input(s): HGBA1C in the last 72 hours. Fasting Lipid Panel No results for input(s): CHOL, HDL, LDLCALC, TRIG,  CHOLHDL, LDLDIRECT in the last 72 hours. Thyroid Function Tests No results for input(s): TSH, T4TOTAL, T3FREE, THYROIDAB in the last 72 hours.  Invalid input(s): FREET3  Other results:     Imaging/Studies:  Dg Chest Portable 1 View  Result Date: 08/27/2015 CLINICAL DATA:  Shortness of breath starting 24 hours ago. EXAM: PORTABLE CHEST 1 VIEW COMPARISON:  08/06/2015 FINDINGS: Stable post CABG surgical changes. The cardiac silhouette is enlarged. Mediastinal contours appear intact. There is no evidence of pneumothorax. There is interstitial pulmonary edema. There has been interval development of right pleural effusion. Streaky airspace opacities in the right lower lobe likely represent atelectasis. Osseous  structures are without acute abnormality. Soft tissues are grossly normal. IMPRESSION: Interval development of interstitial pulmonary edema and right pleural effusion, with probable atelectasis in the right lower lobe. Stable enlargement of the cardiac silhouette. Electronically Signed   By: Fidela Salisbury M.D.   On: 08/27/2015 11:59    Latest Echo  Latest Cath   Medications:     Scheduled Medications: . amLODipine  10 mg Oral Daily  . aspirin EC  81 mg Oral Daily  . atorvastatin  40 mg Oral Daily  . carvedilol  50 mg Oral BID WC  . doxazosin  2 mg Oral Q12H  . enoxaparin (LOVENOX) injection  40 mg Subcutaneous Q24H  . fluticasone furoate-vilanterol  1 puff Inhalation Daily  . furosemide  60 mg Intravenous BID  . hydrALAZINE  100 mg Oral Q8H  . insulin aspart  0-15 Units Subcutaneous TID WC  . insulin aspart  0-5 Units Subcutaneous QHS  . insulin aspart  20 Units Subcutaneous TID WC  . insulin glargine  30 Units Subcutaneous QHS  . isosorbide mononitrate  60 mg Oral Daily  . levothyroxine  50 mcg Oral QAC breakfast  . pantoprazole  40 mg Oral Daily     Infusions:     PRN Medications:     Assessment/Plan   Leonard [name] is admitted with A/C diastolic heart failure complicated by CKD. Admitted several times over the last few months.    1. Acute on Chronic Diastolic CHF - Echo [id number] LVEF 60-65% with grade 2 DD and normal RV - Had only modest diuresis with increased lasix.   Remains volume overloaded.  - Continue lasix 80 mg IV BID. Labs pending. Will likely need continued K supp.  - Will give dose of 2.5 mg metolazone today, but will first wait for labs to result to decide best K supp with hypokalemia yesterday.  2. HTN  - Remains hypertensive in 150s despite extensive regimen - Continue hydralazine 100 mg TID - Continue imdur 60 mg daily - Continue coreg 50 mg BID.  - Continue amlodipine 10 mg daily.  - Will discuss adding clonidine with MD.  May also improve  with diuresis. Will place for prn IV hydralazine for SBP > 170.  - 06/2015 Renal US with left renal cortical irregularity (possibly scarring) and 2.5 cm simple cyst, but otherwise unremarkable. - 06/2015  Renal artery dopplers- No renal artery stenosis.  3. Hypothyroidism- TSH 12.559 on 06/18/15. On Synthroid.  4. CKD Stage IV - Will need to follow closely with diuresis.  - Follows with Leonard [name].  Can consult renal if worsens or UO drops off.  5. CAD s/p CABG 04/2013 - No CP. Continue ASA and Statin. - Continue Imdur 60 mg daily.  6. Mild OSA - AHI 4.6 in 11/2013. Did not qualify for CPAP titration. 7. PAD s/p stent to  right femoral artery 01/2013. -  Peripheral arterial doppler evaluation 08/03/15 with Possible short segment of occlusion in the mid left femoral artery and possible multiple areas of stenosis at the common femoral, proximal profunda, and proximal femoral arteries.  8. HLD - Continue atorvastatin 7. COPD - Per primary 8. Deconditioning - Will mobilize with PT.  9. Hypokalemia  Addendum: Creatinine stable. Will give dose of metolazone 2.5 mg and Supp K today.   Length of Stay: 2  Shirley Friar PA-C 08/29/2015, 7:58 AM  Advanced Heart Failure Team Pager [phone] (M-F; 7a - 4p)  Please contact [facility] for night-coverage after hours (4p -7a ) and weekends on [email or web address]   Patient seen and examined with Oda Kilts, PA-C. We discussed all aspects of the encounter. I agree with the assessment and plan as stated above.   Volume status improving but weight still up. Agree with continuing IV lasix and giving metolazone. Supp K+. I suspect we may be heading toward HD over the next 6-12 months to manage volume status.   Will need paramedicine after d/c.   Casper Pagliuca,MD 11:49 AM

## 2015-08-30 ENCOUNTER — Ambulatory Visit: Payer: Medicaid Other

## 2015-08-30 ENCOUNTER — Ambulatory Visit: Payer: Medicaid Other | Admitting: Hematology and Oncology

## 2015-08-30 ENCOUNTER — Other Ambulatory Visit: Payer: Medicaid Other

## 2015-08-30 LAB — CBC
HCT: 27.5 % — ABNORMAL LOW (ref 39.0–52.0)
Hemoglobin: 7.9 g/dL — ABNORMAL LOW (ref 13.0–17.0)
MCH: 22.8 pg — AB (ref 26.0–34.0)
MCHC: 28.7 g/dL — AB (ref 30.0–36.0)
MCV: 79.3 fL (ref 78.0–100.0)
PLATELETS: 166 10*3/uL (ref 150–400)
RBC: 3.47 MIL/uL — ABNORMAL LOW (ref 4.22–5.81)
RDW: 22.1 % — ABNORMAL HIGH (ref 11.5–15.5)
WBC: 5.6 10*3/uL (ref 4.0–10.5)

## 2015-08-30 LAB — GLUCOSE, CAPILLARY
GLUCOSE-CAPILLARY: 129 mg/dL — AB (ref 65–99)
GLUCOSE-CAPILLARY: 84 mg/dL (ref 65–99)
Glucose-Capillary: 97 mg/dL (ref 65–99)
Glucose-Capillary: 110 mg/dL — ABNORMAL HIGH (ref 65–99)
Glucose-Capillary: 81 mg/dL (ref 65–99)

## 2015-08-30 LAB — BASIC METABOLIC PANEL
Anion gap: 8 (ref 5–15)
BUN: 41 mg/dL — ABNORMAL HIGH (ref 6–20)
CALCIUM: 8.8 mg/dL — AB (ref 8.9–10.3)
CO2: 25 mmol/L (ref 22–32)
CREATININE: 2.6 mg/dL — AB (ref 0.61–1.24)
Chloride: 108 mmol/L (ref 101–111)
GFR, EST AFRICAN AMERICAN: 28 mL/min — AB (ref >60)
GFR, EST NON AFRICAN AMERICAN: 24 mL/min — AB (ref >60)
GLUCOSE: 111 mg/dL — AB (ref 65–99)
Potassium: 4.1 mmol/L (ref 3.5–5.1)
Sodium: 141 mmol/L (ref 135–145)

## 2015-08-30 MED ORDER — HEPARIN SOD (PORK) LOCK FLUSH 100 UNIT/ML IV SOLN
500.0000 [IU] | Freq: Every day | INTRAVENOUS | Status: DC | PRN
  Filled 2015-08-30: qty 5

## 2015-08-30 MED ORDER — METOLAZONE 2.5 MG PO TABS
2.5000 mg | ORAL_TABLET | Freq: Once | ORAL | Status: AC
  Administered 2015-08-30: 2.5 mg via ORAL
  Filled 2015-08-30: qty 1

## 2015-08-30 MED ORDER — CLONIDINE HCL 0.1 MG PO TABS
0.1000 mg | ORAL_TABLET | Freq: Two times a day (BID) | ORAL | Status: DC
  Administered 2015-08-30 (×2): 0.1 mg via ORAL
  Filled 2015-08-30 (×2): qty 1

## 2015-08-30 MED ORDER — SODIUM CHLORIDE 0.9% FLUSH: 10.0000 mL | INTRAVENOUS | Status: DC | PRN

## 2015-08-30 MED ORDER — SODIUM CHLORIDE 0.9% FLUSH: 3.0000 mL | INTRAVENOUS | Status: DC | PRN

## 2015-08-30 MED ORDER — HEPARIN SOD (PORK) LOCK FLUSH 100 UNIT/ML IV SOLN
250.0000 [IU] | INTRAVENOUS | Status: DC | PRN
  Filled 2015-08-30: qty 3

## 2015-08-30 NOTE — Progress Notes (Signed)
Family Medicine Teaching Service Daily Progress Note Intern Pager: [phone]  [name] Medical record number: [id number] Date of birth: 1950/09/27 Age: 65 y.o. Gender: male  Primary Care Provider: [name], MD Consultants: Heart failure team Code Status: Full  Pt Overview and Major Events to Date:   Assessment and Plan: Leonard [name] is a 65 y.o. male presenting with shortness of breath . 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  Acute hypoxic respiratory failure 2/2 to CHF exacerbation likely 2/2 CHF exacerbation. Patient diuresed well in the past 24hr. -1225L on 120 lasix. However patient is still volume overloaded. Crackles are heard on chest exam and patient still on 3L.  -- Daily weights, I/Os -- Will give IV lasix 80mg  BID -- Replete K -- Will get metolazone 2.5 mg per HF team -- Continue supplemental O2 as needed -- Continuous pulse ox  COPD, not currently in exacerbation. Not currently on home O2, had previous home 2L [place] but it was discontinued about 6 months ago. COPD exacerbation unlikely dyspnea more likely 2/2 CHF than COPD exacerbation.  - Continue albuterol 2 puff q6 prnand home [place] - O2 to keep sats above 90%, Wean off as tolerated - Follow up with PCP to reassess need for home O2 - Consider low dose lung CT if no improvement given smoking history. Consider as outpatient for lung cancer screening at discharge.  Hypertension, chronic. Hypertensive at 170/83 on admission. Patient's BP continue to be elevated 152/63 this morning.   --Amlodipine 10 mg po once daily --Carvedilol 50mg  po BID --Doxazosin 2mg  po q12 --Hydralazine 100mg  po q8 --Start Clonidine 0.1 mg po bid  DM type II At home, takes 65 units of lantus bid and 35 units of novolog tid. Patient po intake have decreased with hospitalization  and insulin need will be readjusted in consequence. Last A1C 10.3 in 4/30,2017, next due on 09/02/15. CBG 238 on admission.  - While admitted lantus 20U aspart 20 units 3 times with meals along with moderate Insulin Sliding Scale Glargine 30 units, 2 times daily   CKD III/IV, stable creatinine was 2.51 on admission, at baseline. Creatinine this money Followed by nephrologist as outpatient. - Outpatient follow up with nephrologist - Continue to monitor Cr while diuresing  Hypothyroidism TSH in May 2017 was elevated to 12.559. Synthroid recently started in 06/2015. -Levothyroxine 85mcg po once daily -Recommend outpatient f/u with repeat TSH  Hyperlipidemia Well controlled, will continue home regimen. - continue home Atorvastatin 40 mg once daily   FEN/GI: Fluid restricted,  Renal diet/heart healthy, PPI PPx: lovenoc  Disposition: Admit to CHF  exacerbation management   Subjective:  Patient feeling better this morning, breathing has improved patient does not feel as short of breath. Patient was not able to walk much yesterday because of increase short of breath  Objective: Temp:  [98.1 F (36.7 C)-98.3 F (36.8 C)] 98.2 F (36.8 C) (07/27 0342) Pulse Rate:  [62-71] 66 (07/27 0342) Resp:  [18] 18 (07/27 0342) BP: (146-160)/(54-74) 151/69 (07/27 0342) SpO2:  [92 %-95 %] 95 % (07/27 0342) Weight:  [236 lb 11.2 oz (107.4 kg)] 236 lb 11.2 oz (107.4 kg) (07/27 0342) Physical Exam: General: , pleasant gentlemen in NAD, able to participate in exam Eyes: EOMI, PERRL ENTM: MMM Cardiovascular: RRR, no murmurs appreciated Respiratory: CTAB, normal effort Abdomen: soft, nt, nd, +bs MSK: moving limbs spontaneously,  some LE edema bilaterally +1  Skin: no rashes noted Neuro: A&Ox3, no focal deficits Psych: appropriate affectLaboratory  Laboratory:  Recent Labs Lab 08/28/15 0524 08/29/15 1000 08/30/15 0231  WBC 7.2 4.8 5.6  HGB 7.6* 9.1* 7.9*  HCT 26.8* 31.6* 27.5*  PLT 153 153  166    Recent Labs Lab 08/28/15 0524 08/29/15 0814 08/30/15 0231  NA 144 141 141  K 2.9* 3.8 4.1  CL 110 110 108  CO2 26 24 25   BUN 37* 39* 41*  CREATININE 2.36* 2.53* 2.60*  CALCIUM 8.3* 8.8* 8.8*  GLUCOSE 120* 131* 111*   Dg Chest Portable 1 View  Result Date: 08/27/2015 CLINICAL DATA:  Shortness of breath starting 24 hours ago. EXAM: PORTABLE CHEST 1 VIEW COMPARISON:  08/06/2015 FINDINGS: Stable post CABG surgical changes. The cardiac silhouette is enlarged. Mediastinal contours appear intact. There is no evidence of pneumothorax. There is interstitial pulmonary edema. There has been interval development of right pleural effusion. Streaky airspace opacities in the right lower lobe likely represent atelectasis. Osseous structures are without acute abnormality. Soft tissues are grossly normal. IMPRESSION: Interval development of interstitial pulmonary edema and right pleural effusion, with probable atelectasis in the right lower lobe. Stable enlargement of the cardiac silhouette. Electronically Signed   By: Fidela Salisbury M.D.   On: 08/27/2015 11:59   Marjie Skiff, MD 08/30/2015, 8:04 AM PGY-1, [facility] Intern pager: [phone], text pages welcome
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
## 2015-08-30 NOTE — Progress Notes (Signed)
Advanced Heart Failure Rounding Note  Referring Physician: Dr Andria Frames Primary Physician: Dr Nani Ravens  Primary Cardiologist:  Dr Margaret Pyle Nephrology: Dr Marval Regal  Reason for Consultation: A/C Diastolic Heart Failure   Subjective:    Feels OK this morning. Worked with PT and got SOB getting to the doorway (also had desats).  Still fills swollen. Less orthopneic.  Peed "a lot" yesterday.   Out 1.8 L though weight only down 2 lbs. Creatinine relatively stable. K improved with supp.   He remains 10 lbs up from admission earlier this month.   Most recent Echo: 06/05/15 Echo LVEF 60-65%, Grade 2 DD, Normal RV, PA peak pressure 24 mm Hg  Objective:   Weight Range: 236 lb 11.2 oz (107.4 kg) Body mass index is 35.99 kg/m.   Vital Signs:   Temp:  [98.1 F (36.7 C)-98.3 F (36.8 C)] 98.2 F (36.8 C) (07/27 0342) Pulse Rate:  [62-71] 66 (07/27 0342) Resp:  [18] 18 (07/27 0342) BP: (146-160)/(54-74) 151/69 (07/27 0342) SpO2:  [92 %-95 %] 95 % (07/27 0342) Weight:  [236 lb 11.2 oz (107.4 kg)] 236 lb 11.2 oz (107.4 kg) (07/27 0342) Last BM Date: 08/30/15  Weight change: Filed Weights   08/28/15 0354 08/29/15 0503 08/30/15 0342  Weight: 238 lb 3.2 oz (108 kg) 238 lb 12.8 oz (108.3 kg) 236 lb 11.2 oz (107.4 kg)    Intake/Output:   Intake/Output Summary (Last 24 hours) at 08/30/15 0727 Last data filed at 08/30/15 0130  Gross per 24 hour  Intake             1040 ml  Output             2950 ml  Net            -1910 ml     Physical Exam: General: Lying in bed, NAD  HEENT: normal x for poor dentition Neck: supple. Thick, JVP difficult, but appears elevated to his jawline. Carotids 2+ bilat; no bruits. No thyromegaly or nodule noted.  Cor: PMI nondisplaced. RRR. No murmurs or rubs noted.+s4 Lungs: Decreased bibasilarly Abdomen: Obese, soft, NT, ND, no HSM. No bruits or masses. +BS  Extremities: no cyanosis, clubbing, rash, Trace- 1+ edema 1/2 to knees Neuro: A&Ox3,  cranial nerves grossly intact. moves all 4 extremities w/o difficulty. Affect pleasant  Telemetry: Reviewed personally, remains stable in NSR 60-70s.  Labs: CBC  Recent Labs  08/27/15 1120  08/29/15 1000 08/30/15 0231  WBC 10.3  < > 4.8 5.6  NEUTROABS 8.7*  --   --   --   HGB 8.4*  < > 9.1* 7.9*  HCT 28.1*  < > 31.6* 27.5*  MCV 77.2*  < > 79.4 79.3  PLT 177  < > 153 166  < > = values in this interval not displayed. Basic Metabolic Panel  Recent Labs  08/29/15 0814 08/30/15 0231  NA 141 141  K 3.8 4.1  CL 110 108  CO2 24 25  GLUCOSE 131* 111*  BUN 39* 41*  CREATININE 2.53* 2.60*  CALCIUM 8.8* 8.8*  MG 2.2  --    Liver Function Tests No results for input(s): AST, ALT, ALKPHOS, BILITOT, PROT, ALBUMIN in the last 72 hours. No results for input(s): LIPASE, AMYLASE in the last 72 hours. Cardiac Enzymes No results for input(s): CKTOTAL, CKMB, CKMBINDEX, TROPONINI in the last 72 hours.  BNP: BNP (last 3 results)  Recent Labs  06/28/15 0320 08/06/15 1736 08/27/15 1120  BNP 467.1* 285.9*  415.9*    ProBNP (last 3 results) No results for input(s): PROBNP in the last 8760 hours.   D-Dimer No results for input(s): DDIMER in the last 72 hours. Hemoglobin A1C No results for input(s): HGBA1C in the last 72 hours. Fasting Lipid Panel No results for input(s): CHOL, HDL, LDLCALC, TRIG, CHOLHDL, LDLDIRECT in the last 72 hours. Thyroid Function Tests No results for input(s): TSH, T4TOTAL, T3FREE, THYROIDAB in the last 72 hours.  Invalid input(s): FREET3  Other results:     Imaging/Studies:  No results found.  Latest Echo  Latest Cath   Medications:     Scheduled Medications: . amLODipine  10 mg Oral Daily  . aspirin EC  81 mg Oral Daily  . atorvastatin  40 mg Oral Daily  . carvedilol  50 mg Oral BID WC  . doxazosin  2 mg Oral Q12H  . enoxaparin (LOVENOX) injection  40 mg Subcutaneous Q24H  . fluticasone furoate-vilanterol  1 puff Inhalation Daily    . furosemide  80 mg Intravenous BID  . hydrALAZINE  100 mg Oral Q8H  . insulin aspart  0-15 Units Subcutaneous TID WC  . insulin aspart  0-5 Units Subcutaneous QHS  . insulin aspart  20 Units Subcutaneous TID WC  . insulin glargine  30 Units Subcutaneous QHS  . isosorbide mononitrate  60 mg Oral Daily  . levothyroxine  50 mcg Oral QAC breakfast  . pantoprazole  40 mg Oral Daily  . potassium chloride  20 mEq Oral BID    Infusions:    PRN Medications:     Assessment/Plan   Mr Larick is admitted with A/C diastolic heart failure complicated by CKD. Admitted several times over the last few months.    1. Acute on Chronic Diastolic CHF - Echo [id number] LVEF 60-65% with grade 2 DD and normal RV - Remains volume overloaded, but weight coming down.  - Continue lasix 80 mg IV BID and repeat 2.5 mg metolazone today.  May be limited by renal function.  - Supp K.  2. HTN  - Remains consistently above SBP 140 despite extensive regimen below.  - Continue hydralazine 100 mg TID - Continue imdur 60 mg daily - Continue coreg 50 mg BID.  - Continue amlodipine 10 mg daily.  - Could consider adding clonidine.  But worry about rebound HTN.  He seems to be compliant with his medications at home, so may be an OK option. Will discuss with MD.  - Has prn IV hydralazine for SBP > 170 ordered - 06/2015 Renal US with left renal cortical irregularity (possibly scarring) and 2.5 cm simple cyst, but otherwise unremarkable. - 06/2015  Renal artery dopplers- No renal artery stenosis.  3. Hypothyroidism- TSH 12.559 on 06/18/15. On Synthroid.  4. CKD Stage IV - Creatinine stable. His baseline appears to be 2.8 - 3.0 recently. Continue to follow closely with diuresis.  - Sees Dr. Marval Regal.  With frequent re-admissions, suspect he is rapidly approaching dialysis ( 6-12 months at most)   5. 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 Continue ASA and Statin. - Continue Imdur 60 mg daily.  6. Mild OSA - AHI 4.6 in  11/2013.  - Has not qualified for CPAP titration. 7. PAD s/p stent to right femoral artery 01/2013. -  Peripheral arterial doppler evaluation 08/03/15 with Possible short segment of occlusion in the mid left femoral artery and possible multiple areas of stenosis at the common femoral, proximal profunda, and proximal femoral arteries.  -  He is on a statin.  - Stable this admission.  8. HLD - Continue atorvastatin 7. COPD - Per primary team.  8. Deconditioning - No home PT recommended. De-satted after walking 20 feet.  - Continue working with PT while in house.  9. Hypokalemia - Improved with supp. Continue with IV diuresis + metolazone.    Length of Stay: 3  Shirley Friar PA-C 08/30/2015, 7:27 AM  Advanced Heart Failure Team Pager [phone] (M-F; 7a - 4p)  Please contact [facility] for night-coverage after hours (4p -7a ) and weekends on [email or web address]   Patient seen with PA, agree with the above note.  He diuresed yesterday, still volume overloaded.  Creatinine stable.  Continue Lasix IV + dose of metolazone, reassess in am.  Follow creatinine closely.   Loralie Champagne 08/30/2015 2:25 PM

## 2015-08-31 ENCOUNTER — Other Ambulatory Visit: Payer: Self-pay

## 2015-08-31 DIAGNOSIS — N183 Chronic kidney disease, stage 3 (moderate): Secondary | ICD-10-CM

## 2015-08-31 LAB — BASIC METABOLIC PANEL
ANION GAP: 9 (ref 5–15)
BUN: 44 mg/dL — AB (ref 6–20)
CALCIUM: 8.8 mg/dL — AB (ref 8.9–10.3)
CO2: 27 mmol/L (ref 22–32)
Chloride: 105 mmol/L (ref 101–111)
Creatinine, Ser: 2.68 mg/dL — ABNORMAL HIGH (ref 0.61–1.24)
GFR calc Af Amer: 27 mL/min — ABNORMAL LOW (ref >60)
GFR, EST NON AFRICAN AMERICAN: 24 mL/min — AB (ref >60)
GLUCOSE: 120 mg/dL — AB (ref 65–99)
Potassium: 3.8 mmol/L (ref 3.5–5.1)
SODIUM: 141 mmol/L (ref 135–145)

## 2015-08-31 LAB — GLUCOSE, CAPILLARY
GLUCOSE-CAPILLARY: 188 mg/dL — AB (ref 65–99)
GLUCOSE-CAPILLARY: 281 mg/dL — AB (ref 65–99)
Glucose-Capillary: 134 mg/dL — ABNORMAL HIGH (ref 65–99)
Glucose-Capillary: 185 mg/dL — ABNORMAL HIGH (ref 65–99)

## 2015-08-31 LAB — TROPONIN I

## 2015-08-31 MED ORDER — NITROGLYCERIN 0.4 MG SL SUBL
SUBLINGUAL_TABLET | SUBLINGUAL | Status: AC
  Administered 2015-08-31: 0.4 mg via SUBLINGUAL
  Filled 2015-08-31: qty 1

## 2015-08-31 MED ORDER — METOLAZONE 2.5 MG PO TABS
2.5000 mg | ORAL_TABLET | Freq: Every day | ORAL | Status: DC
  Administered 2015-08-31 – 2015-09-02 (×3): 2.5 mg via ORAL
  Filled 2015-08-31 (×3): qty 1

## 2015-08-31 MED ORDER — NITROGLYCERIN 0.4 MG SL SUBL
0.4000 mg | SUBLINGUAL_TABLET | SUBLINGUAL | Status: DC | PRN
  Filled 2015-08-31: qty 1

## 2015-08-31 MED ORDER — INSULIN ASPART 100 UNIT/ML ~~LOC~~ SOLN
20.0000 [IU] | Freq: Three times a day (TID) | SUBCUTANEOUS | Status: DC
  Administered 2015-09-01 – 2015-09-02 (×5): 20 [IU] via SUBCUTANEOUS

## 2015-08-31 MED ORDER — CARVEDILOL 25 MG PO TABS
50.0000 mg | ORAL_TABLET | Freq: Two times a day (BID) | ORAL | Status: DC
  Administered 2015-09-01 – 2015-09-02 (×3): 50 mg via ORAL
  Filled 2015-08-31 (×3): qty 2

## 2015-08-31 MED ORDER — CLONIDINE HCL 0.2 MG PO TABS
0.2000 mg | ORAL_TABLET | Freq: Two times a day (BID) | ORAL | Status: DC
  Administered 2015-08-31 – 2015-09-02 (×5): 0.2 mg via ORAL
  Filled 2015-08-31 (×5): qty 1

## 2015-08-31 NOTE — Progress Notes (Signed)
Physical Therapy Treatment Patient Details Name: Leonard [name] MRN: [id number] DOB: 27-Jun-1950 Today's Date: 08/31/2015    History of Present Illness 65 yo M with history of CKD, CAD, diastolic CHF, type 2 diabetes, hyperlipidemia, PVD, COPD, CABG x 3 2015,  hypertension admitted with dyspnea and hypoxia. Dx of CHF exacerbation.     PT Comments    Pt participated well with session. O2 sats 92% on RA at rest, 88% on RA during ambulation. Mild dyspnea noted. Replaced [place] O2 at end of session-sats recovered to 96% on 2L. Will continue to follow and progress activity as tolerated.   Follow Up Recommendations  No PT follow up;Supervision for mobility/OOB     Equipment Recommendations  None recommended by PT    Recommendations for Other Services       Precautions / Restrictions Precautions Precautions: Fall Precaution Comments: monitor O2 sats Restrictions Weight Bearing Restrictions: No    Mobility  Bed Mobility               General bed mobility comments: oob in recliner  Transfers Overall transfer level: Needs assistance Equipment used: Rolling walker (2 wheeled) Transfers: Sit to/from Stand Sit to Stand: Supervision         General transfer comment: for safety  Ambulation/Gait Ambulation/Gait assistance: Min guard Ambulation Distance (Feet): 215 Feet Assistive device: Rolling walker (2 wheeled) Gait Pattern/deviations: Step-through pattern     General Gait Details: Pt did well with taking rest breaks but he still required cueing for pacing. O2 sats 88% on RA during ambulation. Dyspnea 2/4. Cues for attention to task. Pt was intermittently distracted by environment.   Stairs            Wheelchair Mobility    Modified Rankin (Stroke Patients Only)       Balance                                    Cognition Arousal/Alertness: Awake/alert Behavior During Therapy: WFL for tasks assessed/performed Overall Cognitive Status:  Within Functional Limits for tasks assessed                      Exercises      General Comments        Pertinent Vitals/Pain Pain Assessment: No/denies pain    Home Living                      Prior Function            PT Goals (current goals can now be found in the care plan section) Progress towards PT goals: Progressing toward goals    Frequency  Min 3X/week    PT Plan Current plan remains appropriate    Co-evaluation             End of Session Equipment Utilized During Treatment: Oxygen Activity Tolerance: Patient tolerated treatment well Patient left: in chair;with call bell/phone within reach     Time: 1323-1349 PT Time Calculation (min) (ACUTE ONLY): 26 min  Charges:  $Gait Training: 23-37 mins                    G Codes:      Weston Anna, MPT Pager: [phone]

## 2015-08-31 NOTE — Progress Notes (Signed)
Advanced Heart Failure Rounding Note  Referring Physician: Dr Andria Frames Primary Physician: Dr Nani Ravens  Primary Cardiologist:  Dr Margaret Pyle Nephrology: Dr Marval Regal  Reason for Consultation: A/C Diastolic Heart Failure   Subjective:   Continues to diurese with IV lasix + metolazone. Negative 1.3 liters  Denies SOB.   Most recent Echo: 06/05/15 Echo LVEF 60-65%, Grade 2 DD, Normal RV, PA peak pressure 24 mm Hg  Objective:   Weight Range: 104.6 kg (230 lb 11.2 oz) Body mass index is 35.08 kg/m.   Vital Signs:   Temp:  [97.6 F (36.4 C)] 97.6 F (36.4 C) (07/27 2027) Pulse Rate:  [62] 62 (07/27 2027) Resp:  [18-19] 19 (07/27 2027) BP: (139-147)/(64-85) 147/65 (07/27 2027) SpO2:  [91 %-100 %] 100 % (07/27 2027) Weight:  [104.6 kg (230 lb 11.2 oz)] 104.6 kg (230 lb 11.2 oz) (07/28 0414) Last BM Date: 08/30/15  Weight change: Filed Weights   08/29/15 0503 08/30/15 0342 08/31/15 0414  Weight: 108.3 kg (238 lb 12.8 oz) 107.4 kg (236 lb 11.2 oz) 104.6 kg (230 lb 11.2 oz)    Intake/Output:   Intake/Output Summary (Last 24 hours) at 08/31/15 0855 Last data filed at 08/31/15 0803  Gross per 24 hour  Intake             1680 ml  Output             3725 ml  Net            -2045 ml     Physical Exam: General: In the chair. NAD  HEENT: normal x for poor dentition Neck: supple. Thick, JVP to jaw.  Carotids 2+ bilat; no bruits. No thyromegaly or nodule noted.  Cor: PMI nondisplaced. RRR. No murmurs or rubs noted.+s4 Lungs: RLL crackles. On 2 liters [place].  Abdomen: Obese, soft, NT, ND, no HSM. No bruits or masses. +BS  Extremities: no cyanosis, clubbing, rash, Trace- 1+ edema 1/2 to knees Neuro: A&Ox3, cranial nerves grossly intact. moves all 4 extremities w/o difficulty. Affect pleasant  Telemetry: NSR 60-70s.  Labs: CBC  Recent Labs  08/29/15 1000 08/30/15 0231  WBC 4.8 5.6  HGB 9.1* 7.9*  HCT 31.6* 27.5*  MCV 79.4 79.3  PLT 153 [id number]   Basic Metabolic  Panel  Recent Labs  08/29/15 0814 08/30/15 0231 08/31/15 0512  NA 141 141 141  K 3.8 4.1 3.8  CL 110 108 105  CO2 24 25 27   GLUCOSE 131* 111* 120*  BUN 39* 41* 44*  CREATININE 2.53* 2.60* 2.68*  CALCIUM 8.8* 8.8* 8.8*  MG 2.2  --   --    Liver Function Tests No results for input(s): AST, ALT, ALKPHOS, BILITOT, PROT, ALBUMIN in the last 72 hours. No results for input(s): LIPASE, AMYLASE in the last 72 hours. Cardiac Enzymes No results for input(s): CKTOTAL, CKMB, CKMBINDEX, TROPONINI in the last 72 hours.  BNP: BNP (last 3 results)  Recent Labs  06/28/15 0320 08/06/15 1736 08/27/15 1120  BNP 467.1* 285.9* 415.9*    ProBNP (last 3 results) No results for input(s): PROBNP in the last 8760 hours.   D-Dimer No results for input(s): DDIMER in the last 72 hours. Hemoglobin A1C No results for input(s): HGBA1C in the last 72 hours. Fasting Lipid Panel No results for input(s): CHOL, HDL, LDLCALC, TRIG, CHOLHDL, LDLDIRECT in the last 72 hours. Thyroid Function Tests No results for input(s): TSH, T4TOTAL, T3FREE, THYROIDAB in the last 72 hours.  Invalid input(s): FREET3  Other results:     Imaging/Studies:  No results found.  Latest Echo  Latest Cath   Medications:     Scheduled Medications: . amLODipine  10 mg Oral Daily  . aspirin EC  81 mg Oral Daily  . atorvastatin  40 mg Oral Daily  . carvedilol  50 mg Oral BID WC  . cloNIDine  0.1 mg Oral BID  . doxazosin  2 mg Oral Q12H  . enoxaparin (LOVENOX) injection  40 mg Subcutaneous Q24H  . fluticasone furoate-vilanterol  1 puff Inhalation Daily  . furosemide  80 mg Intravenous BID  . hydrALAZINE  100 mg Oral Q8H  . insulin aspart  0-15 Units Subcutaneous TID WC  . insulin aspart  0-5 Units Subcutaneous QHS  . insulin aspart  20 Units Subcutaneous TID WC  . insulin glargine  30 Units Subcutaneous QHS  . isosorbide mononitrate  60 mg Oral Daily  . levothyroxine  50 mcg Oral QAC breakfast  .  pantoprazole  40 mg Oral Daily  . potassium chloride  20 mEq Oral BID    Infusions:    PRN Medications:     Assessment/Plan   Leonard [name] is admitted with A/C diastolic heart failure complicated by CKD. Admitted several times over the last few months.    1. Acute on Chronic Diastolic CHF - Echo [id number] LVEF 60-65% with grade 2 DD and normal RV --Volume status improving but still a ways to go.  Continue lasix 80 mg IV BID and repeat 2.5 mg metolazone today.  Once diurese anticipate switching from oral lasix to torsemide 40 mg twice a day.  Add ted hose. May be limited by renal function.  2. HTN  - Remains consistently above SBP 140 despite extensive regimen below.  - Continue hydralazine 100 mg TID - Continue imdur 60 mg daily - Continue coreg 50 mg BID.  - Continue amlodipine 10 mg daily.  -Increase clonidine 0.2 mg twice a day.   - Has prn IV hydralazine for SBP > 170 ordered - 06/2015 Renal US with left renal cortical irregularity (possibly scarring) and 2.5 cm simple cyst, but otherwise unremarkable. - 06/2015  Renal artery dopplers- No renal artery stenosis.  3. Hypothyroidism- TSH 12.559 on 06/18/15. On Synthroid.  4. CKD Stage IV - Creatinine stable. His baseline appears to be 2.8 - 3.0 recently. Creatinine today 2.68. Continue to follow closely with diuresis.  - Sees Dr. Marval Regal.  With frequent re-admissions, suspect he is rapidly approaching dialysis ( 6-12 months at most)   5. 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 Continue ASA and Statin. - Continue Imdur 60 mg daily.  6. Mild OSA - AHI 4.6 in 11/2013.  - Has not qualified for CPAP titration. 7. PAD s/p stent to right femoral artery 01/2013. -  Peripheral arterial doppler evaluation 08/03/15 with Possible short segment of occlusion in the mid left femoral artery and possible multiple areas of stenosis at the common femoral, proximal profunda, and proximal femoral arteries.  - He is on a statin.  - Stable this  admission.  8. HLD - Continue atorvastatin 7. COPD - Per primary team.  8. Deconditioning - No home PT recommended.  - Continue working with PT while in house.  9. Hypokalemia -Stable. K 3.8     Length of Stay: 4  Amy Clegg NP-C  08/31/2015, 8:55 AM  Advanced Heart Failure Team Pager [phone] (M-F; [place])  Please contact [facility] for night-coverage after hours (4p -  7a ) and weekends on [email or web address]   Patient seen with NP, agree with the above note.  He remains volume overloaded but improving.  Diuresing on current regimen. Weight down. Continue Lasix 80 mg IV bid + metolazone 2.5 x 1 today, reassess tomorrow.  Eventually to torsemide 40 mg po bid.   Loralie Champagne 08/31/2015 9:47 AM

## 2015-08-31 NOTE — Progress Notes (Signed)
Family Medicine Teaching Service Daily Progress Note Intern Pager: [phone]  [name] Medical record number: [id number] Date of birth: Oct 24, 1950 Age: 65 y.o. Gender: male  Primary Care Provider: [name], MD Consultants: Heart failure team Code Status: Full  Pt Overview and Major Events to Date:   Assessment and Plan: Leonard [name] is a 65 y.o. male presenting with shortness of breath . PMH is significant for T2DM, HFpEF, CKD III/IV, hypothyroidism, resistant HTN, PVD s/p right 4th and 5th digit amputation, COPD.  Acute on chronic CHF: Echo 06/05/15 LVEF 60-65% with grade 2 DD and normal RV. UOP in 24 hrs 3.4L, now -1.3L. Crackles on RLL and patient still on 2L. Aggressive diuresis may be limited by renal function. - Daily weights, I/Os - Per heart failure team, continue IV lasix 80mg  BID and repeat 2.5mg  metolazone today. Anticipate DC on torsemide 40mg  BID. - Wean O2 - Add TED hose  Hypertension, chronic. BP continues to be elevated throughout hospital stay despite extensive regimen, 147/65 this morning.  - Continue hydralazine 100 mg TID - Continue imdur 60 mg daily - Continue coreg 50 mg BID.  - Continue amlodipine 10 mg daily.  - per Heart failure team, increase clonidine 0.2 mg twice a day.     COPD, not currently in exacerbation. Not currently on home O2, had previous home 2L [place] but it was discontinued about 6 months ago. COPD exacerbation unlikely dyspnea more likely 2/2 CHF than COPD exacerbation.  - Continue albuterol 2 puff q6 prnand home Dulera - O2 to keep sats above 90%, Wean off as tolerated - Follow up with PCP to reassess need for home O2 - Consider low dose lung CT if no improvement given smoking history. Consider as outpatient for lung cancer screening at discharge.  DM type II At home on Lantus 65U bid and Novolog 35U tid.  Last A1C 10.3 in 4/30,2017, next due on 09/02/15.  - Lantus 30U qhs - Novolog 20 units TID with  meals - mod SSI  CKD IV, baseline 2.51 now 2.68 Followed by nephrologist as outpatient. - Outpatient follow up with nephrologist - Continue to monitor Cr while diuresing  Hypokalemia, resolved K 3.8 - monitor   Hypothyroidism TSH 12.559 in May 2017. Synthroid recently started in 06/2015. -Levothyroxine 31mcg po once daily -Recommend outpatient f/u with repeat TSH  Hyperlipidemia Well controlled, will continue home regimen. 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 Peripheral arterial doppler evaluation 08/03/15 with Possible short segment of occlusion in the mid left femoral artery and possible multiple areas of stenosis at the common femoral, proximal profunda, and proximal femoral arteries.   FEN/GI: Fluid restricted,  Renal diet/heart healthy, PPI PPx: lovenox   Disposition: Admit to CHF  exacerbation management   Subjective:  Patient feeling better this morning, breathing has improved patient does not feel as short of breath. Patient was able to walk yesterday. Still feels legs are swollen above baseline.   Objective: Temp:  [97.6 F (36.4 C)] 97.6 F (36.4 C) (07/27 2027) Pulse Rate:  [62] 62 (07/27 2027) Resp:  [18-19] 19 (07/27 2027) BP: (139-147)/(64-85) 147/65 (07/27 2027) SpO2:  [91 %-100 %] 100 % (07/27 2027) Weight:  [230 lb 11.2 oz (104.6 kg)] 230 lb 11.2 oz (104.6 kg) (07/28 0414) Physical Exam:  General: sitting  in chair, pleasant gentlemen in NAD, able to participate in exam ENTM: MMM Cardiovascular: RRR, no murmurs appreciated Respiratory: CTAB, normal effort Abdomen: soft, nt, nd, +bs MSK: moving limbs spontaneously, LE edema bilaterally +2 to knees Skin: no rashes noted Neuro: A&Ox3, no focal deficits Psych: appropriate affect  Laboratory:  Recent Labs Lab 08/28/15 0524 08/29/15 1000  08/30/15 0231  WBC 7.2 4.8 5.6  HGB 7.6* 9.1* 7.9*  HCT 26.8* 31.6* 27.5*  PLT 153 153 166    Recent Labs Lab 08/28/15 0524 08/29/15 0814 08/30/15 0231  NA 144 141 141  K 2.9* 3.8 4.1  CL 110 110 108  CO2 26 24 25   BUN 37* 39* 41*  CREATININE 2.36* 2.53* 2.60*  CALCIUM 8.3* 8.8* 8.8*  GLUCOSE 120* 131* 111*   Dg Chest Portable 1 View  Result Date: 08/27/2015 CLINICAL DATA:  Shortness of breath starting 24 hours ago. EXAM: PORTABLE CHEST 1 VIEW COMPARISON:  08/06/2015 FINDINGS: Stable post CABG surgical changes. The cardiac silhouette is enlarged. Mediastinal contours appear intact. There is no evidence of pneumothorax. There is interstitial pulmonary edema. There has been interval development of right pleural effusion. Streaky airspace opacities in the right lower lobe likely represent atelectasis. Osseous structures are without acute abnormality. Soft tissues are grossly normal. IMPRESSION: Interval development of interstitial pulmonary edema and right pleural effusion, with probable atelectasis in the right lower lobe. Stable enlargement of the cardiac silhouette. Electronically Signed   By: Fidela Salisbury M.D.   On: 08/27/2015 11:59   Bufford Lope, DO 08/31/2015, 7:07 AM PGY-1, [place] Intern pager: [phone], text pages welcome

## 2015-09-01 DIAGNOSIS — I5043 Acute on chronic combined systolic (congestive) and diastolic (congestive) heart failure: Secondary | ICD-10-CM

## 2015-09-01 LAB — BASIC METABOLIC PANEL
ANION GAP: 8 (ref 5–15)
BUN: 44 mg/dL — ABNORMAL HIGH (ref 6–20)
CHLORIDE: 101 mmol/L (ref 101–111)
CO2: 28 mmol/L (ref 22–32)
Calcium: 8.8 mg/dL — ABNORMAL LOW (ref 8.9–10.3)
Creatinine, Ser: 2.69 mg/dL — ABNORMAL HIGH (ref 0.61–1.24)
GFR calc non Af Amer: 23 mL/min — ABNORMAL LOW (ref >60)
GFR, EST AFRICAN AMERICAN: 27 mL/min — AB (ref >60)
GLUCOSE: 163 mg/dL — AB (ref 65–99)
Potassium: 4.2 mmol/L (ref 3.5–5.1)
Sodium: 137 mmol/L (ref 135–145)

## 2015-09-01 LAB — GLUCOSE, CAPILLARY
GLUCOSE-CAPILLARY: 188 mg/dL — AB (ref 65–99)
GLUCOSE-CAPILLARY: 144 mg/dL — AB (ref 65–99)
GLUCOSE-CAPILLARY: 53 mg/dL — AB (ref 65–99)
GLUCOSE-CAPILLARY: 100 mg/dL — AB (ref 65–99)
Glucose-Capillary: 151 mg/dL — ABNORMAL HIGH (ref 65–99)
Glucose-Capillary: 72 mg/dL (ref 65–99)

## 2015-09-01 MED ORDER — TORSEMIDE 20 MG PO TABS
40.0000 mg | ORAL_TABLET | Freq: Two times a day (BID) | ORAL | Status: DC
  Administered 2015-09-01 – 2015-09-02 (×2): 40 mg via ORAL
  Filled 2015-09-01 (×2): qty 2

## 2015-09-01 MED ORDER — INSULIN GLARGINE 100 UNIT/ML ~~LOC~~ SOLN: 15.0000 [IU] | Freq: Every day | SUBCUTANEOUS | Status: DC

## 2015-09-01 NOTE — Progress Notes (Signed)
Family Medicine Teaching Service Daily Progress Note Intern Pager: [phone]  [name] Medical record number: [id number] Date of birth: 01-23-1951 Age: 65 y.o. Gender: male  Primary Care Provider: [name], MD Consultants: Heart failure team Code Status: Full  Pt Overview and Major Events to Date:   Assessment and Plan: Leonard [name] is a 65 y.o. male presenting with shortness of breath . PMH is significant for T2DM, HFpEF, CKD III/IV, hypothyroidism, resistant HTN, PVD s/p right 4th and 5th digit amputation, COPD.  #Acute on chronic CHF: Echo 06/05/15 LVEF 60-65% with grade 2 DD and normal RV. UOP in 24 hrs -3.5L. Aggressive diuresis may be limited by renal function. -- Daily weights, I/Os --Transition to po torsemide 40mg  bid today (7/29), in anticipation for discharge because patient is almost at dry weight --Will wean O2 today with decreased  Volume overload --Add TED hose  #Hypertension, chronic. BP continues to be elevated throughout hospital stay despite extensive regimen, this morning.  -- Continue hydralazine 100 mg TID -- Continue imdur 60 mg daily -- Continue coreg 50 mg BID.  -- Continue amlodipine 10 mg daily.  --Continue Clonidine 0.2 mg twice a day.     #COPD, not currently in exacerbation. Not currently on home O2, had previous home 2L [place] but it was discontinued about 6 months ago. COPD exacerbation unlikely dyspnea more likely 2/2 CHF than COPD exacerbation.  -- Continue albuterol 2 puff q6 prnand home Dulera -- O2 to keep sats above 90%, Wean off as tolerated -- Follow up with PCP to reassess need for home O2 -- Consider low dose lung CT if no improvement given smoking history. Consider as outpatient for lung cancer screening at discharge.  #DM type II At home on Lantus 65U bid and Novolog 35U tid.  Last A1C 10.3 in 4/30,2017, next due on 09/02/15. Blood sugar continue to be elevated. --Check A1c --Lantus 30U qhs --Novolog 20  units TID with meals --mod SSI  #CKD IV, baseline 2.51 now  Followed by nephrologist as outpatient. --Outpatient follow up with nephrologist --Continue to monitor Cr while diuresing  #Hypokalemia, resolving K 3.8 --Continue to monitor, replete as needed. Goal >4 prior to discharge    #Hypothyroidism TSH 12.559 in May 2017. Synthroid recently started in 06/2015. --Levothyroxine 68mcg po once daily --Recommend outpatient f/u with repeat TSH  #Hyperlipidemia Well controlled, will continue home regimen. 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 Peripheral arterial doppler evaluation 08/03/15 with Possible short segment of occlusion in the mid left femoral artery and possible multiple areas of stenosis at the common femoral, proximal profunda, and proximal femoral arteries.   FEN/GI: Fluid restricted,  Renal diet/heart healthy, PPI PPx: lovenox   Disposition: Admit for CHF exacerbation management   Subjective:  Patient is doing better this morning, patient breathing has improved and patient is ambulating, feels he close to going home.  Objective: Temp:  [98 F (36.7 C)-98.4 F (36.9 C)] 98 F (36.7 C) (07/28 2011) Pulse Rate:  [63-65] 65 (07/28 2011) Resp:  [20] 20 (07/28 2011) BP: (156-157)/(61-67) 156/67 (07/28 2011) SpO2:  [96 %-99 %] 99 % (07/28 2011) Physical Exam:  General: sitting in chair, pleasant gentlemen in NAD, able to participate in exam ENTM: MMM Cardiovascular: RRR, no murmurs appreciated Respiratory: CTAB, normal effort Abdomen: soft, nt, nd, +bs  MSK: moving limbs spontaneously, LE edema bilaterally +2 to knees Skin: no rashes noted Neuro: A&Ox3, no focal deficits Psych: appropriate affect  Laboratory:  Recent Labs Lab 08/28/15 0524 08/29/15 1000 08/30/15 0231  WBC 7.2 4.8 5.6  HGB 7.6* 9.1* 7.9*   HCT 26.8* 31.6* 27.5*  PLT 153 153 166    Recent Labs Lab 08/29/15 0814 08/30/15 0231 08/31/15 0512  NA 141 141 141  K 3.8 4.1 3.8  CL 110 108 105  CO2 24 25 27   BUN 39* 41* 44*  CREATININE 2.53* 2.60* 2.68*  CALCIUM 8.8* 8.8* 8.8*  GLUCOSE 131* 111* 120*   Troponin <0.03   Intake/Output Summary (Last 24 hours) at 09/01/15 0525 Last data filed at 09/01/15 0444  Gross per 24 hour  Intake              700 ml  Output             4151 ml  Net            -3451 ml    Marjie Skiff, MD 09/01/2015, 5:21 AM PGY-1, [place] Intern pager: [phone], text pages welcome

## 2015-09-01 NOTE — Progress Notes (Signed)
Patient ID: Leonard [name], male   DOB: 03/11/1950, 65 y.o.   MRN: [id number]    [name] Date of Encounter: 09/01/2015     Active Problems:   Resistant hypertension   PVD - hx of Rt SFA PTA and s/p Rt 4-5th toe amp Dec 2014   CAD (coronary artery disease)   S/P CABG x 3   History of tobacco use   Acute on chronic diastolic CHF (congestive heart failure) (HCC)   CHF (congestive heart failure) (HCC)   Shortness of breath   SOB (shortness of breath)   Acute on chronic systolic congestive heart failure (HCC)    SUBJECTIVE  dyspne improved. He is not sure what his dry weight is. Probably close to euvolemic.  CURRENT MEDS . amLODipine  10 mg Oral Daily  . aspirin EC  81 mg Oral Daily  . atorvastatin  40 mg Oral Daily  . carvedilol  50 mg Oral BID WC  . cloNIDine  0.2 mg Oral BID  . doxazosin  2 mg Oral Q12H  . enoxaparin (LOVENOX) injection  40 mg Subcutaneous Q24H  . fluticasone furoate-vilanterol  1 puff Inhalation Daily  . hydrALAZINE  100 mg Oral Q8H  . insulin aspart  0-15 Units Subcutaneous TID WC  . insulin aspart  0-5 Units Subcutaneous QHS  . insulin aspart  20 Units Subcutaneous TID WC  . insulin glargine  30 Units Subcutaneous QHS  . isosorbide mononitrate  60 mg Oral Daily  . levothyroxine  50 mcg Oral QAC breakfast  . metolazone  2.5 mg Oral Daily  . pantoprazole  40 mg Oral Daily  . potassium chloride  20 mEq Oral BID  . torsemide  40 mg Oral BID    OBJECTIVE  Vitals:   08/31/15 2011 09/01/15 0535 09/01/15 0822 09/01/15 1104  BP: (!) 156/67 (!) 151/60  (!) 144/62  Pulse: 65 65  61  Resp: 20 20    Temp: 98 F (36.7 C) 98.1 F (36.7 C)    TempSrc: Oral Oral    SpO2: 99% 96% 96% 100%  Weight:  226 lb 4.8 oz (102.6 kg)    Height:        Intake/Output Summary (Last 24 hours) at 09/01/15 1216 Last data filed at 09/01/15 1103  Gross per 24 hour  Intake              940 ml  Output             4401 ml  Net            -3461 ml     Filed Weights   08/30/15 0342 08/31/15 0414 09/01/15 0535  Weight: 236 lb 11.2 oz (107.4 kg) 230 lb 11.2 oz (104.6 kg) 226 lb 4.8 oz (102.6 kg)    PHYSICAL EXAM  General: Pleasant, NAD. Neuro: Alert and oriented X 3. Moves all extremities spontaneously. Psych: Normal affect. HEENT:  Normal  Neck: Supple without bruits or JVD. Lungs:  Resp regular and unlabored, CTA. Heart: RRR no s3, s4, or murmurs. Abdomen: Soft, non-tender, non-distended, BS + x 4.  Extremities: No clubbing, cyanosis or edema. DP/PT/Radials 2+ and equal bilaterally.  Accessory Clinical Findings  CBC  Recent Labs  08/30/15 0231  WBC 5.6  HGB 7.9*  HCT 27.5*  MCV 79.3  PLT [id number]   Basic Metabolic Panel  Recent Labs  08/31/15 0512 09/01/15 0400  NA 141 137  K 3.8 4.2  CL 105 101  CO2 27 28  GLUCOSE 120* 163*  BUN 44* 44*  CREATININE 2.68* 2.69*  CALCIUM 8.8* 8.8*   Liver Function Tests No results for input(s): AST, ALT, ALKPHOS, BILITOT, PROT, ALBUMIN in the last 72 hours. No results for input(s): LIPASE, AMYLASE in the last 72 hours. Cardiac Enzymes  Recent Labs  08/31/15 1905  TROPONINI <0.03   BNP Invalid input(s): POCBNP D-Dimer No results for input(s): DDIMER in the last 72 hours. Hemoglobin A1C No results for input(s): HGBA1C in the last 72 hours. Fasting Lipid Panel No results for input(s): CHOL, HDL, LDLCALC, TRIG, CHOLHDL, LDLDIRECT in the last 72 hours. Thyroid Function Tests No results for input(s): TSH, T4TOTAL, T3FREE, THYROIDAB in the last 72 hours.  Invalid input(s): FREET3  TELE  nsr  Radiology/Studies  Dg Chest 2 View  Result Date: 08/06/2015 CLINICAL DATA:  Left-sided chest pain today.  Shortness of breath. EXAM: CHEST  2 VIEW COMPARISON:  07/03/2015 FINDINGS: Patient is post median sternotomy. Improved lung volumes from prior exam. Cardiomegaly mediastinal contours are unchanged. Vascular congestion appears chronic. Increased mixed perihilar opacities  suspicious for pulmonary edema. Minimal fluid in the fissures, no large subpulmonic effusion. No confluent airspace disease or pneumothorax. Osseous structures are unchanged. IMPRESSION: 1. Increased interstitial edema from prior exam. 2. Chronic cardiomegaly and vascular congestion. Electronically Signed   By: Jeb Levering M.D.   On: 08/06/2015 21:20   Dg Chest Portable 1 View  Result Date: 08/27/2015 CLINICAL DATA:  Shortness of breath starting 24 hours ago. EXAM: PORTABLE CHEST 1 VIEW COMPARISON:  08/06/2015 FINDINGS: Stable post CABG surgical changes. The cardiac silhouette is enlarged. Mediastinal contours appear intact. There is no evidence of pneumothorax. There is interstitial pulmonary edema. There has been interval development of right pleural effusion. Streaky airspace opacities in the right lower lobe likely represent atelectasis. Osseous structures are without acute abnormality. Soft tissues are grossly normal. IMPRESSION: Interval development of interstitial pulmonary edema and right pleural effusion, with probable atelectasis in the right lower lobe. Stable enlargement of the cardiac silhouette. Electronically Signed   By: Fidela Salisbury M.D.   On: 08/27/2015 11:59   ASSESSMENT AND PLAN  1. Acute on chronic systolic and diastolic heart failure - his symptoms are well compensated and he is nearly at baseline. I 'd suggest one more day of IV lasix and dc tomorrow with torsemide. Alternatively, DC today with initiation of Torsemide and early followup also reasonable.  2. CAD - he is s/p CABG. Denies angina 3. Chronic renal insufficiency, - his creatinine is unchanged over the past few days. Will follow.  Gregg Taylor,M.D.  09/01/2015 12:16 PM

## 2015-09-02 LAB — BASIC METABOLIC PANEL
Anion gap: 11 (ref 5–15)
BUN: 48 mg/dL — ABNORMAL HIGH (ref 6–20)
CHLORIDE: 100 mmol/L — AB (ref 101–111)
CO2: 27 mmol/L (ref 22–32)
CREATININE: 2.7 mg/dL — AB (ref 0.61–1.24)
Calcium: 8.8 mg/dL — ABNORMAL LOW (ref 8.9–10.3)
GFR calc Af Amer: 27 mL/min — ABNORMAL LOW (ref >60)
GFR, EST NON AFRICAN AMERICAN: 23 mL/min — AB (ref >60)
Glucose, Bld: 150 mg/dL — ABNORMAL HIGH (ref 65–99)
POTASSIUM: 4.4 mmol/L (ref 3.5–5.1)
Sodium: 138 mmol/L (ref 135–145)

## 2015-09-02 LAB — GLUCOSE, CAPILLARY
GLUCOSE-CAPILLARY: 150 mg/dL — AB (ref 65–99)
GLUCOSE-CAPILLARY: 194 mg/dL — AB (ref 65–99)
GLUCOSE-CAPILLARY: 171 mg/dL — AB (ref 65–99)

## 2015-09-02 MED ORDER — CLONIDINE HCL 0.2 MG PO TABS: 0.2000 mg | ORAL_TABLET | Freq: Two times a day (BID) | ORAL | 0 refills | Status: DC

## 2015-09-02 MED ORDER — INSULIN GLARGINE 100 UNIT/ML ~~LOC~~ SOLN
15.0000 [IU] | Freq: Every day | SUBCUTANEOUS | Status: DC
  Administered 2015-09-02: 15 [IU] via SUBCUTANEOUS
  Filled 2015-09-02 (×2): qty 0.15

## 2015-09-02 MED ORDER — INSULIN GLARGINE 100 UNIT/ML SOLOSTAR PEN: 30.0000 [IU] | PEN_INJECTOR | Freq: Every day | SUBCUTANEOUS | 2 refills | Status: DC

## 2015-09-02 MED ORDER — TORSEMIDE 20 MG PO TABS: 40.0000 mg | ORAL_TABLET | Freq: Two times a day (BID) | ORAL | 0 refills | Status: DC

## 2015-09-02 NOTE — Discharge Summary (Signed)
[facility] Discharge Summary  [name] Medical record number: [id number] Date of birth: October 15, 1950 Age: 65 y.o. Gender: male Date of Admission: 08/27/2015  Date of Discharge: 09/02/2015 Admitting Physician: Zenia Resides, MD  Primary Care Provider: [name], MD Consultants: Heart Failure Team  Indication for Hospitalization: Dyspnea and hypoxia  Discharge Diagnoses/Problem List:  Acute on Chronic CHF HTN COPD DM type 2 CKD IV Hypothyroidism Hyperlipidemia CADs/p CABG OSA PAD s/p stent  Disposition: Home  Discharge Condition: Stable  Discharge Exam:  General: sitting in chair, pleasant gentlemen in NAD, able to participate in exam ENTM: MMM Cardiovascular: RRR, no murmur, rubs or gallops, normal S1 and S2, good pulses Respiratory: CTAB, normal effort, no wheezes, rhonchi or rales Abdomen: soft, nt, nd, +bs, no guarding, no rebound tenderness MSK: moving limbs spontaneously, very minimal lower extremities edema bilaterally Skin: no rashes noted, warm and dry Neuro: AOx4, no focal deficits, cranial II-XII intact, normal strength, and sensation bilaterally Psych: appropriate affect and mood  Brief Hospital Course:  Leonard [name] a 65 y.o.malepresented with shortness of breath. PMH is significant for T2DM, HFpEF, CKD III/IV, hypothyroidism, resistant HTN, PVD s/p right 4th and 5th digit amputation, COPD.  #Acute on chronic CHF: Patient was admitted for shortness of breath, and placed on 3L [place]. Patient had crackles on exam, and 2+ pitting edema in his lower extremities. Patient was recently admitted for similar symptoms at the beginning of the month and discharged (08/09/2015) close to dry weight. Patient was apparently confused about his diuretics regimen which led to recent hospitalization. Heart failure team was consulted, for optimal regimen titration during and post discharge. During current admission,  patient was diuresed aggressively with a total output over 10 L since admission. Patient was started on IV lasix 60 mg BID and increase to 80 mg BID with an additional 2.5 mg of metolazone. Patient was transitioned to 40mg  torsemide prior to discharge and continue to have good urine output.  Echo 06/05/15 LVEF 60-65% with grade 2 DD and normal RV. Aggressive diuresis may be limited by renal function. --Continue po torsemide 40mg  bid today    #Hypertension, chronic. BP continues to be elevated throughout hospital stay despite extensive regimen. Patient was started on clonidine for better control of BP.  -- Continue hydralazine 100 mg TID -- Continue imdur 60 mg daily -- Continue coreg 50 mg BID.  -- Continue amlodipine 10 mg daily.  -- Continue Clonidine 0.2 mgBID    #COPD, not currently in exacerbation. Not currently on home O2, had previous home2L [place] but it was discontinued about 6 months ago.COPD exacerbation unlikely dyspnea more likely 2/2 CHF than COPD exacerbation. Patient was weaned off 2L  during hospital stay and maintain good oxygenation. New oxygenation 2/2 CHF exacerbation and volume overload. Upon discharge, patient will home regimen.  -- Continue albuterol 2 puff q6 prnand home Dulera -- Follow up with PCP to reassess need for home O2  #DM type IIAt home on Lantus 65U bidand Novolog 35U tid.Last A1C 10.3 in 4/30,2017, next due on 09/02/15. Hemoglobin A1c was ordered during hospitalization, result will be available after discharge. Blood sugar were fairly well controlled during hospitalization --F/u on A1c --ResumeLantus 65U qhs --Novolog 35 units TID with meals  #CKD IV, baseline 2.51, slight increase while inpatient with aggressive diuresis (2.70). Followed by nephrologist as outpatient. --Outpatient follow up with nephrologist  #Hypokalemia, resolved On discharge K is 4.4 up from 2.9 on admission.   #  HypothyroidismTSH 12.559 in May 2017.Synthroid recently  started in 06/2015. --Levothyroxine 28mcg po once daily --Recommend outpatient f/u with repeat TSH  #HyperlipidemiaWell controlled, will continue home regimen. 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 Peripheral arterial doppler evaluation 08/03/15 with Possible short segment of occlusion in the mid left femoral artery and possible multiple areas of stenosis at the common femoral, proximal profunda, and proximal femoral arteries.  Issues for Follow Up:  1. Patient started on new medications Torsemide 40 mg po BID and clonidine 0.2 mg. Please assess compliance, weight (post discharge), diet (salt and fluid restriction) and BP. 2. Assess for oxygen requirement, patient previously on O2 at home, was discontinued about 6 months ago.  Significant Procedures: None  Significant Labs and Imaging:   Recent Labs Lab 08/28/15 0524 08/29/15 1000 08/30/15 0231  WBC 7.2 4.8 5.6  HGB 7.6* 9.1* 7.9*  HCT 26.8* 31.6* 27.5*  PLT 153 153 166    Recent Labs Lab 08/29/15 0814 08/30/15 0231 08/31/15 0512 09/01/15 0400 09/02/15 0210  NA 141 141 141 137 138  K 3.8 4.1 3.8 4.2 4.4  CL 110 108 105 101 100*  CO2 24 25 27 28 27   GLUCOSE 131* 111* 120* 163* 150*  BUN 39* 41* 44* 44* 48*  CREATININE 2.53* 2.60* 2.68* 2.69* 2.70*  CALCIUM 8.8* 8.8* 8.8* 8.8* 8.8*  MG 2.2  --   --   --   --     Dg Chest Portable 1 View  Result Date: 08/27/2015 CLINICAL DATA:  Shortness of breath starting 24 hours ago. EXAM: PORTABLE CHEST 1 VIEW COMPARISON:  08/06/2015 FINDINGS: Stable post CABG surgical changes. The cardiac silhouette is enlarged. Mediastinal contours appear intact. There is no evidence of pneumothorax. There is interstitial pulmonary edema. There has been interval development of right pleural effusion. Streaky airspace opacities in the right  lower lobe likely represent atelectasis. Osseous structures are without acute abnormality. Soft tissues are grossly normal. IMPRESSION: Interval development of interstitial pulmonary edema and right pleural effusion, with probable atelectasis in the right lower lobe. Stable enlargement of the cardiac silhouette. Electronically Signed   By: Fidela Salisbury M.D.   On: 08/27/2015 11:59  Results/Tests Pending at Time of Discharge: Hgb A1c (last 06/03/2015-10.3)  Discharge Medications:    Medication List    ASK your doctor about these medications   acetaminophen 500 MG tablet Commonly known as:  TYLENOL Take 1 tablet (500 mg total) by mouth every 6 (six) hours as needed for mild pain or moderate pain.   ADVAIR DISKUS 250-50 MCG/DOSE Aepb Generic drug:  Fluticasone-Salmeterol INHALE 1 PUFF INTO THE LUNGS 2 TIMES DAILY   albuterol 108 (90 Base) MCG/ACT inhaler Commonly known as:  PROVENTIL HFA;VENTOLIN HFA Inhale 2 puffs into the lungs every 6 (six) hours as needed for wheezing or shortness of breath.   amLODipine 10 MG tablet Commonly known as:  NORVASC Take 1 tablet (10 mg total) by mouth daily.   aspirin 81 MG EC tablet Take 1 tablet (81 mg total) by mouth daily.   atorvastatin 40 MG tablet Commonly known as:  LIPITOR TAKE 1 TABLET BY MOUTH EVERY DAY AT 6 PM   carvedilol 25 MG tablet Commonly known as:  COREG Take 2 tablets (50 mg total) by mouth 2 (two) times daily with a meal.   doxazosin 2  MG tablet Commonly known as:  CARDURA TAKE ONE TABLET BY MOUTH EVERY TWELVE HOURS   furosemide 80 MG tablet Commonly known as:  LASIX Take 1 tablet (80 mg total) by mouth 2 (two) times daily.   hydrALAZINE 100 MG tablet Commonly known as:  APRESOLINE TAKE 1 TABLET (100MG ) BY MOUTH EVERY 8 HOURS   insulin aspart 100 UNIT/ML FlexPen Commonly known as:  NOVOLOG FLEXPEN Inject 35 Units into the skin 3 (three) times daily with meals.   Insulin Glargine 100 UNIT/ML Solostar  Pen Commonly known as:  LANTUS SOLOSTAR Inject 30 Units into the skin daily.   isosorbide mononitrate 60 MG 24 hr tablet Commonly known as:  IMDUR TAKE ONE (1) TABLET BY MOUTH EVERY DAY   levothyroxine 50 MCG tablet Commonly known as:  SYNTHROID, LEVOTHROID Take 1 tablet (50 mcg total) by mouth daily before breakfast.   nitroGLYCERIN 0.4 MG SL tablet Commonly known as:  NITROSTAT Place 1 tablet (0.4 mg total) under the tongue every 5 (five) minutes as needed for chest pain.   pantoprazole 40 MG tablet Commonly known as:  PROTONIX Take 1 tablet (40 mg total) by mouth daily.   SURE COMFORT PEN NEEDLES 31G X 8 MM Misc Generic drug:  Insulin Pen Needle USE WITH BOTH INSULIN PENS TWICE DAILY (4 PER DAY)       Discharge Instructions: Please refer to Patient Instructions section of EMR for full details.  Patient was counseled important signs and symptoms that should prompt return to medical care, changes in medications, dietary instructions, activity restrictions, and follow up appointments.   Follow-Up Appointments: Follow-up Information    Marjie Skiff, MD Follow up on 09/07/2015.   Specialty:  Family Medicine Why:   for an hospital follow up at 3:30pm Contact information: [place] [address] [phone]           Marjie Skiff, MD 09/02/2015, 5:24 AM PGY-1, [place]

## 2015-09-02 NOTE — Progress Notes (Signed)
Pt discharged home IV and tele removed. Discharge instructions given. Questions answered.

## 2015-09-02 NOTE — Progress Notes (Signed)
Pt ambulated in the hallway Spo2 rem ained 100-92 % on room air.

## 2015-09-02 NOTE — Progress Notes (Signed)
Patient ID: ZURIEL OHR, male   DOB: 04/14/1950, 65 y.o.   MRN: [id number]    [name] Date of Encounter: 09/02/2015     Active Problems:   Resistant hypertension   PVD - hx of Rt SFA PTA and s/p Rt 4-5th toe amp Dec 2014   CAD (coronary artery disease)   S/P CABG x 3   History of tobacco use   Acute on chronic diastolic CHF (congestive heart failure) (HCC)   CHF (congestive heart failure) (HCC)   Shortness of breath   SOB (shortness of breath)   Acute on chronic systolic congestive heart failure (HCC)    SUBJECTIVE  Feels good. Awaiting ambulation in halls.  CURRENT MEDS . amLODipine  10 mg Oral Daily  . aspirin EC  81 mg Oral Daily  . atorvastatin  40 mg Oral Daily  . carvedilol  50 mg Oral BID WC  . cloNIDine  0.2 mg Oral BID  . doxazosin  2 mg Oral Q12H  . enoxaparin (LOVENOX) injection  40 mg Subcutaneous Q24H  . fluticasone furoate-vilanterol  1 puff Inhalation Daily  . hydrALAZINE  100 mg Oral Q8H  . insulin aspart  0-15 Units Subcutaneous TID WC  . insulin aspart  0-5 Units Subcutaneous QHS  . insulin aspart  20 Units Subcutaneous TID WC  . insulin glargine  15 Units Subcutaneous QHS  . isosorbide mononitrate  60 mg Oral Daily  . levothyroxine  50 mcg Oral QAC breakfast  . metolazone  2.5 mg Oral Daily  . pantoprazole  40 mg Oral Daily  . potassium chloride  20 mEq Oral BID  . torsemide  40 mg Oral BID    OBJECTIVE  Vitals:   09/01/15 2006 09/02/15 0330 09/02/15 0404 09/02/15 0846  BP: (!) 154/58  (!) 154/66   Pulse: (!) 58  64   Resp: 19  18   Temp: 97.9 F (36.6 C)  98 F (36.7 C)   TempSrc: Oral  Tympanic   SpO2: 100%  95% 97%  Weight:  226 lb 1.6 oz (102.6 kg)    Height:        Intake/Output Summary (Last 24 hours) at 09/02/15 0925 Last data filed at 09/02/15 0836  Gross per 24 hour  Intake             1200 ml  Output             3375 ml  Net            -2175 ml   Filed Weights   08/31/15 0414 09/01/15 0535  09/02/15 0330  Weight: 230 lb 11.2 oz (104.6 kg) 226 lb 4.8 oz (102.6 kg) 226 lb 1.6 oz (102.6 kg)    PHYSICAL EXAM  General: Pleasant, middle aged man, NAD. Neuro: Alert and oriented X 3. Moves all extremities spontaneously. Psych: Normal affect. HEENT:  Normal  Neck: Supple without bruits or JVD. Lungs:  Resp regular and unlabored, CTA. Heart: RRR no s3, s4, or murmurs. Abdomen: Soft, non-tender, non-distended, BS + x 4.  Extremities: No clubbing, cyanosis or edema. DP/PT/Radials 2+ and equal bilaterally.  Accessory Clinical Findings  CBC No results for input(s): WBC, NEUTROABS, HGB, HCT, MCV, PLT in the last 72 hours. Basic Metabolic Panel  Recent Labs  09/01/15 0400 09/02/15 0210  NA 137 138  K 4.2 4.4  CL 101 100*  CO2 28 27  GLUCOSE 163* 150*  BUN 44* 48*  CREATININE 2.69* 2.70*  CALCIUM 8.8* 8.8*   Liver Function Tests No results for input(s): AST, ALT, ALKPHOS, BILITOT, PROT, ALBUMIN in the last 72 hours. No results for input(s): LIPASE, AMYLASE in the last 72 hours. Cardiac Enzymes  Recent Labs  08/31/15 1905  TROPONINI <0.03   BNP Invalid input(s): POCBNP D-Dimer No results for input(s): DDIMER in the last 72 hours. Hemoglobin A1C No results for input(s): HGBA1C in the last 72 hours. Fasting Lipid Panel No results for input(s): CHOL, HDL, LDLCALC, TRIG, CHOLHDL, LDLDIRECT in the last 72 hours. Thyroid Function Tests No results for input(s): TSH, T4TOTAL, T3FREE, THYROIDAB in the last 72 hours.  Invalid input(s): FREET3  TELE  nsr  Radiology/Studies  Dg Chest 2 View  Result Date: 08/06/2015 CLINICAL DATA:  Left-sided chest pain today.  Shortness of breath. EXAM: CHEST  2 VIEW COMPARISON:  07/03/2015 FINDINGS: Patient is post median sternotomy. Improved lung volumes from prior exam. Cardiomegaly mediastinal contours are unchanged. Vascular congestion appears chronic. Increased mixed perihilar opacities suspicious for pulmonary edema. Minimal  fluid in the fissures, no large subpulmonic effusion. No confluent airspace disease or pneumothorax. Osseous structures are unchanged. IMPRESSION: 1. Increased interstitial edema from prior exam. 2. Chronic cardiomegaly and vascular congestion. Electronically Signed   By: Jeb Levering M.D.   On: 08/06/2015 21:20   Dg Chest Portable 1 View  Result Date: 08/27/2015 CLINICAL DATA:  Shortness of breath starting 24 hours ago. EXAM: PORTABLE CHEST 1 VIEW COMPARISON:  08/06/2015 FINDINGS: Stable post CABG surgical changes. The cardiac silhouette is enlarged. Mediastinal contours appear intact. There is no evidence of pneumothorax. There is interstitial pulmonary edema. There has been interval development of right pleural effusion. Streaky airspace opacities in the right lower lobe likely represent atelectasis. Osseous structures are without acute abnormality. Soft tissues are grossly normal. IMPRESSION: Interval development of interstitial pulmonary edema and right pleural effusion, with probable atelectasis in the right lower lobe. Stable enlargement of the cardiac silhouette. Electronically Signed   By: Fidela Salisbury M.D.   On: 08/27/2015 11:59   ASSESSMENT AND PLAN  1. Acute on chronic systolic and diastolic heart failure - his weight is unchanged since yesterday. Probably euvolemic. Would dc on Torsemide 40 mg daily. followup with Dr. Aundra Dubin in [facility] 2. CAD - s/p CABG. No chest pain 3. Chronic renal insuff. - his creatinine is unchanged.   Railey Glad,M.D.  09/02/2015 9:25 AM

## 2015-09-02 NOTE — Discharge Instructions (Signed)
--  We are starting on 2 new medications: -------------Torsemide 40mg  take twice a day. (2 pills in the morning and 2 pills in the evening)  -------------Clonidine 0.2mg  take twice a day. (1 pill in the morning and 1 pill in the evening) --Please make sure you weight yourself and record it for your PCP when you get home on the day you are discharged. --It is important that you weight yourself everyday and record it. --Remember you are suppose to be on a low salt diet. --Remember to control how much water you are drinking everyday. --You have an appointment with the doctor this Friday at 3:[facility]. Please bring your weight log with you to the appointment. --Heath NEXT TIME SHE COMES TO YOUR HOUSE

## 2015-09-03 ENCOUNTER — Telehealth (HOSPITAL_COMMUNITY): Payer: Self-pay | Admitting: Surgery

## 2015-09-03 LAB — HEMOGLOBIN A1C
Hgb A1c MFr Bld: 6.5 % — ABNORMAL HIGH (ref 4.8–5.6)
MEAN PLASMA GLUCOSE: 140 mg/dL

## 2015-09-03 NOTE — Telephone Encounter (Signed)
Heart Failure Nurse Navigator Post Discharge Telephone Call  I called Leonard [name] regarding his follow-up appt and to see how he was since his recent hospitalization.  He tells me that he is "fine".  Weight today is 222.5 lbs versus discharge weight of 226 lbs.  He denies any worsening swelling or SOB.  He says he has no issue with getting or taking prescribed medications.  I confirmed his appt scheduled on 09/28/15 at 0940.  He says he plans to be there.

## 2015-09-05 ENCOUNTER — Ambulatory Visit (INDEPENDENT_AMBULATORY_CARE_PROVIDER_SITE_OTHER): Payer: Medicaid Other | Admitting: Family Medicine

## 2015-09-05 ENCOUNTER — Telehealth: Payer: Self-pay | Admitting: *Deleted

## 2015-09-05 ENCOUNTER — Encounter: Payer: Self-pay | Admitting: Family Medicine

## 2015-09-05 DIAGNOSIS — I5033 Acute on chronic diastolic (congestive) heart failure: Secondary | ICD-10-CM

## 2015-09-05 DIAGNOSIS — I5023 Acute on chronic systolic (congestive) heart failure: Secondary | ICD-10-CM | POA: Diagnosis not present

## 2015-09-05 DIAGNOSIS — J449 Chronic obstructive pulmonary disease, unspecified: Secondary | ICD-10-CM

## 2015-09-05 DIAGNOSIS — I10 Essential (primary) hypertension: Secondary | ICD-10-CM | POA: Diagnosis not present

## 2015-09-05 MED ORDER — AMLODIPINE BESYLATE 10 MG PO TABS: 10.0000 mg | ORAL_TABLET | Freq: Every day | ORAL | 1 refills | Status: DC

## 2015-09-05 MED ORDER — CLONIDINE HCL 0.2 MG PO TABS: 0.2000 mg | ORAL_TABLET | Freq: Two times a day (BID) | ORAL | 1 refills | Status: DC

## 2015-09-05 MED ORDER — TORSEMIDE 20 MG PO TABS: 40.0000 mg | ORAL_TABLET | Freq: Two times a day (BID) | ORAL | 1 refills | Status: DC

## 2015-09-05 MED ORDER — LEVOTHYROXINE SODIUM 50 MCG PO TABS: 50.0000 ug | ORAL_TABLET | Freq: Every day | ORAL | 2 refills | Status: DC

## 2015-09-05 MED ORDER — PANTOPRAZOLE SODIUM 40 MG PO TBEC
40.0000 mg | DELAYED_RELEASE_TABLET | Freq: Every day | ORAL | 1 refills | Status: DC
Start: 2015-09-05 — End: 2015-09-07

## 2015-09-05 NOTE — Assessment & Plan Note (Signed)
Seems improved since admission.  Weight stable.  I consolidated his medication bottles.  He seems to have his medications well organzied for the momemt.  Will see cardiology next week.

## 2015-09-05 NOTE — Patient Instructions (Addendum)
Good to see you today!  Thanks for coming in.  Keep taking all the medications as you are.  Your blood pressure and weight are doing well  Come back and see Dr Juanito Doom in the next 2 weeks.  See Dr Valentina Lucks next week   Bring all your medications including your inhalers and your insulin   If you are gaining weight then call us right away

## 2015-09-05 NOTE — Assessment & Plan Note (Addendum)
BP Readings from Last 3 Encounters:  09/05/15 (!) 154/65  09/02/15 (!) 132/50  08/27/15 (!) 179/64   Is mildly high today but given his multiple medications do not think he would benefit from adding more at least for now.     Should have lab work at next visit to check lytes and crt

## 2015-09-05 NOTE — Progress Notes (Signed)
Patient left before blood pressure could be rechecked. Shyniece Scripter,CMA

## 2015-09-05 NOTE — Progress Notes (Signed)
Patient ID: Leonard [name], male   DOB: May 15, 1950, 66 y.o.   MRN: [id number]  Subjective  Patient is presenting with the following illnesses after hosptial DC on 7/30  CHF Feeling much less shortness of breath and fluid overload.  Occsl chest pain for which he takes a nitro which relieves it.  Edema much improved.  Urinating frequently.  Taking medications as per list. I consolidated  3-4 duplicate bottles of medications and confiscated other stopped medications.  His sister accompanied him and he had af filled pill bottle by his home nurse  Hypertension Taking medications as per list.  Not checking home blood pressure but thinks his nurse readings have been good.  Chest pain as above.  No lightheadness or edema much better  COPD Did not bring his inhalers.  Feels his breathing is good.  No fever or cough or sputum.  Unsure of exactly how he uses his inhalers  Chief Complaint noted Review of Symptoms - see HPI PMH - Smoking status noted.     Objective Vital Signs reviewed Alert nad Lungs - clear no wheeze Heart distant RRR Extrem some lipidedema but no pitting edema Weight 222 was 226 at discharge from hospital     Assessments/Plans  CHF (congestive heart failure) (HCC) Seems improved since admission.  Weight stable.  I consolidated his medication bottles.  He seems to have his medications well organzied for the momemt.  Will see cardiology next week.    COPD (chronic obstructive pulmonary disease) Seems stable although he did not bring in his inhalers and seems not to be sure which is which and when to take.  See after visit summary   Resistant hypertension BP Readings from Last 3 Encounters:  09/05/15 (!) 154/65  09/02/15 (!) 132/50  08/27/15 (!) 179/64   Is mildly high today but given his multiple medications do not think he would benefit from adding more at least for now.      See Encounter view if individual problem A/Ps not visible See after visit summary for  details of patient instuctions

## 2015-09-05 NOTE — Assessment & Plan Note (Signed)
Seems stable although he did not bring in his inhalers and seems not to be sure which is which and when to take.  See after visit summary

## 2015-09-07 ENCOUNTER — Encounter (HOSPITAL_COMMUNITY): Payer: Self-pay

## 2015-09-07 ENCOUNTER — Emergency Department (HOSPITAL_COMMUNITY): Payer: Medicaid Other

## 2015-09-07 ENCOUNTER — Inpatient Hospital Stay: Payer: Medicaid Other | Admitting: Family Medicine

## 2015-09-07 ENCOUNTER — Emergency Department (HOSPITAL_COMMUNITY)
Admission: EM | Admit: 2015-09-07 | Discharge: 2015-09-07 | Disposition: A | Discharge status: Home / Self Care | Payer: Medicaid Other | Attending: Emergency Medicine | Admitting: Emergency Medicine

## 2015-09-07 ENCOUNTER — Other Ambulatory Visit: Payer: Self-pay

## 2015-09-07 DIAGNOSIS — N184 Chronic kidney disease, stage 4 (severe): Secondary | ICD-10-CM | POA: Insufficient documentation

## 2015-09-07 DIAGNOSIS — Z794 Long term (current) use of insulin: Secondary | ICD-10-CM | POA: Insufficient documentation

## 2015-09-07 DIAGNOSIS — I2510 Atherosclerotic heart disease of native coronary artery without angina pectoris: Secondary | ICD-10-CM | POA: Insufficient documentation

## 2015-09-07 DIAGNOSIS — J449 Chronic obstructive pulmonary disease, unspecified: Secondary | ICD-10-CM | POA: Diagnosis not present

## 2015-09-07 DIAGNOSIS — Z87891 Personal history of nicotine dependence: Secondary | ICD-10-CM | POA: Diagnosis not present

## 2015-09-07 DIAGNOSIS — I130 Hypertensive heart and chronic kidney disease with heart failure and stage 1 through stage 4 chronic kidney disease, or unspecified chronic kidney disease: Secondary | ICD-10-CM | POA: Insufficient documentation

## 2015-09-07 DIAGNOSIS — Z7982 Long term (current) use of aspirin: Secondary | ICD-10-CM | POA: Diagnosis not present

## 2015-09-07 DIAGNOSIS — I5032 Chronic diastolic (congestive) heart failure: Secondary | ICD-10-CM | POA: Diagnosis not present

## 2015-09-07 DIAGNOSIS — R072 Precordial pain: Secondary | ICD-10-CM | POA: Diagnosis not present

## 2015-09-07 DIAGNOSIS — E860 Dehydration: Secondary | ICD-10-CM | POA: Diagnosis not present

## 2015-09-07 DIAGNOSIS — Z951 Presence of aortocoronary bypass graft: Secondary | ICD-10-CM | POA: Diagnosis not present

## 2015-09-07 DIAGNOSIS — E1122 Type 2 diabetes mellitus with diabetic chronic kidney disease: Secondary | ICD-10-CM | POA: Diagnosis not present

## 2015-09-07 DIAGNOSIS — I5023 Acute on chronic systolic (congestive) heart failure: Secondary | ICD-10-CM | POA: Diagnosis not present

## 2015-09-07 DIAGNOSIS — E039 Hypothyroidism, unspecified: Secondary | ICD-10-CM | POA: Diagnosis not present

## 2015-09-07 DIAGNOSIS — R079 Chest pain, unspecified: Secondary | ICD-10-CM | POA: Diagnosis present

## 2015-09-07 LAB — BASIC METABOLIC PANEL
ANION GAP: 14 (ref 5–15)
BUN: 92 mg/dL — AB (ref 6–20)
CHLORIDE: 97 mmol/L — AB (ref 101–111)
CO2: 23 mmol/L (ref 22–32)
Calcium: 9.1 mg/dL (ref 8.9–10.3)
Creatinine, Ser: 3.56 mg/dL — ABNORMAL HIGH (ref 0.61–1.24)
GFR calc Af Amer: 19 mL/min — ABNORMAL LOW (ref >60)
GFR calc non Af Amer: 17 mL/min — ABNORMAL LOW (ref >60)
GLUCOSE: 266 mg/dL — AB (ref 65–99)
POTASSIUM: 3.8 mmol/L (ref 3.5–5.1)
Sodium: 134 mmol/L — ABNORMAL LOW (ref 135–145)

## 2015-09-07 LAB — CBC WITH DIFFERENTIAL/PLATELET
Basophils Absolute: 0 10*3/uL (ref 0.0–0.1)
Basophils Relative: 0 %
Eosinophils Absolute: 0.1 10*3/uL (ref 0.0–0.7)
Eosinophils Relative: 2 %
HEMATOCRIT: 29.1 % — AB (ref 39.0–52.0)
HEMOGLOBIN: 9 g/dL — AB (ref 13.0–17.0)
LYMPHS ABS: 0.6 10*3/uL — AB (ref 0.7–4.0)
LYMPHS PCT: 8 %
MCH: 23.5 pg — AB (ref 26.0–34.0)
MCHC: 30.9 g/dL (ref 30.0–36.0)
MCV: 76 fL — AB (ref 78.0–100.0)
MONO ABS: 1 10*3/uL (ref 0.1–1.0)
MONOS PCT: 13 %
NEUTROS ABS: 5.8 10*3/uL (ref 1.7–7.7)
NEUTROS PCT: 77 %
Platelets: 181 10*3/uL (ref 150–400)
RBC: 3.83 MIL/uL — AB (ref 4.22–5.81)
RDW: 20.1 % — ABNORMAL HIGH (ref 11.5–15.5)
WBC: 7.5 10*3/uL (ref 4.0–10.5)

## 2015-09-07 LAB — I-STAT TROPONIN, ED
TROPONIN I, POC: 0.01 ng/mL (ref 0.00–0.08)
Troponin i, poc: 0.01 ng/mL (ref 0.00–0.08)

## 2015-09-07 LAB — BRAIN NATRIURETIC PEPTIDE: B Natriuretic Peptide: 115.3 pg/mL — ABNORMAL HIGH (ref 0.0–100.0)

## 2015-09-07 MED ORDER — PANTOPRAZOLE SODIUM 40 MG PO TBEC: 40.0000 mg | DELAYED_RELEASE_TABLET | Freq: Every day | ORAL | 1 refills | Status: DC

## 2015-09-07 NOTE — Consult Note (Signed)
CARDIOLOGY CONSULT NOTE   Patient ID: ADEBAYO HOLTER MRN: [id number] DOB/AGE: 1950-07-28 65 y.o.  Admit date: 09/07/2015  Primary Physician   [name], MD Primary Cardiologist   Previously Dr. Mare Ferrari (Now Dr, Johnsie Cancel) CHF: Dr. Aundra Dubin Reason for Consultation   Chest pain  Requesting Physician  Dr. Ellender Hose  HPI: Leonard [name] is a 65 y.o. male with a history of DM type II, chronic diastolic CHF with preserved EF, CAD S/P CABG 2015, CKD IV, hypothyroidism, resistant hypertension, PAD s/p right 4th and 5th digit amputation, COPD and sleep Him but did not qualify for CPAP and brought by EMS for evaluation of chest pain.  Admitted 6 times in the last 6 months with volume overload/HF. Last admission 08/28/15 -09/02/15 for acute on chronic diastolic CHF and managed by heart failure team. Patient was started on IV lasix 60 mg BID and increase to 80 mg BID with an additional 2.5 mg of metolazone. Patient was transitioned to 40mg  torsemide prior to discharge and continue to have good urine output. Total diuresis of over 10L. patient continued to have difficult to control blood pressure. Discharge weight of 226lb.   Patient was seen in PCP office 09/05/15. He was feeling much better with less shortness of breath at that time. Complains of occasional chest pain which relieved with nitroglycerin. Medication list was sorted out by provider Wt of 222lb. .  While sitting this morning around 10 AM he had onset of chest pain. He described the pain as a "heartburn". This started midsternal area and radiated epigastric area. He took 3 sublingual nitroglycerin without relief and EMS was called. His pain resolved after receiving aspirin by EMS on ED route. The patient denies any further reoccurrence. Patient states that he is feeling a lot better since discharge. Denies shortness of breath, orthopnea, PND, syncope, melena, blood in his stool or urine, no activity edema.  EKG shows sinus rhythm at Vent.  rate 69 BPM. Point-of-care troponin 0.01. BNP 111.3. BUN/creatinine of 92/3.56. At discharge it was 48/2.7. Hemoglobin of 9. Chest x-ray without acute cardiopulmonary disease.  Currently chest pain-free. Echo 06/05/15 LVEF 60-65% with grade 2 DD and normal RV.     Past Medical History:  Diagnosis Date  . CHF (congestive heart failure) ([place])   . Gangrene of toe ([place])    right 5th/notes 01/04/2013   . High cholesterol   . Hypertension   . Iron deficiency anemia 08/03/2015  . 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 PAD (peripheral artery disease) ([place])   . Shortness of breath dyspnea   . Type II diabetes mellitus ([place])      Past Surgical History:  Procedure Laterality Date  . AMPUTATION Right 01/28/2013   Procedure: RAY AMPUTATION RIGHT  4th & 5th TOE;  Surgeon: Rosetta Posner, MD;  Location: [facility] OR;  Service: Vascular;  Laterality: Right;  . ANGIOPLASTY / STENTING FEMORAL Right 01/13/2013   superficial femoral artery x 2 (3 mm x 60 mm, 4 mm x 60 mm)  . CORONARY ARTERY BYPASS GRAFT N/A 04/07/2013   Procedure: CORONARY ARTERY BYPASS GRAFTING (CABG);  Surgeon: Ivin Poot, MD;  Location: [place];  Service: Open Heart Surgery;  Laterality: N/A;  . INTRAOPERATIVE TRANSESOPHAGEAL ECHOCARDIOGRAM N/A 04/07/2013   Procedure: INTRAOPERATIVE TRANSESOPHAGEAL ECHOCARDIOGRAM;  Surgeon: Ivin Poot, MD;  Location: [place];  Service: Open Heart Surgery;  Laterality: N/A;  . LEFT HEART CATHETERIZATION WITH CORONARY ANGIOGRAM N/A 04/05/2013   Procedure: LEFT HEART  CATHETERIZATION WITH CORONARY ANGIOGRAM;  Surgeon: Peter M Martinique, MD;  Location[facility] C[facility] LAB;  Service: Cardiovascular;  Laterality: N/A;  . LOWER EXTREMITY ANGIOGRAM Bilateral 01/06/2013   Procedure: LOWER EXTREMITY ANGIOGRAM;  Surgeon: Conrad Mentor, MD;  Location[facility] C[facility] LAB;  Service: Cardiovascular;  Laterality: Bilateral;  . LOWER EXTREMITY ANGIOGRAM Right 01/13/2013   Procedure: LOWER EXTREMITY ANGIOGRAM;  Surgeon:  Conrad Northlake, MD;  Location: [facility] CATH LAB;  Service: Cardiovascular;  Laterality: Right;  . SKIN CANCER EXCISION  2017  . THROAT SURGERY  1983   polyps  . TONSILLECTOMY AND ADENOIDECTOMY  ~ 1965    No Known Allergies  I have reviewed the patient's current medications     Prior to Admission medications   Medication Sig Start Date End Date Taking? Authorizing Provider  ADVAIR DISKUS 250-50 MCG/DOSE AEPB INHALE 1 PUFF INTO THE LUNGS 2 TIMES DAILY 06/25/15  Yes Rosemarie Ax, MD  albuterol (PROVENTIL HFA;VENTOLIN HFA) 108 (90 Base) MCG/ACT inhaler Inhale 2 puffs into the lungs every 6 (six) hours as needed for wheezing or shortness of breath. 06/12/15  Yes Hillary Corinda Gubler, MD  amLODipine (NORVASC) 10 MG tablet Take 1 tablet (10 mg total) by mouth daily. 09/05/15  Yes Lind Covert, MD  aspirin EC 81 MG EC tablet Take 1 tablet (81 mg total) by mouth daily. 06/06/15  Yes Mercy Riding, MD  atorvastatin (LIPITOR) 40 MG tablet TAKE 1 TABLET BY MOUTH EVERY DAY AT 6 PM 08/09/15  Yes Vivi Barrack, MD  carvedilol (COREG) 25 MG tablet Take 2 tablets (50 mg total) by mouth 2 (two) times daily with a meal. 08/09/15  Yes Vivi Barrack, MD  doxazosin (CARDURA) 2 MG tablet TAKE ONE TABLET BY MOUTH EVERY TWELVE HOURS 08/16/15  Yes [name], MD  hydrALAZINE (APRESOLINE) 100 MG tablet TAKE 1 TABLET (100MG ) BY MOUTH EVERY 8 HOURS 07/31/15  Yes Rosemarie Ax, MD  insulin aspart (NOVOLOG FLEXPEN) 100 UNIT/ML FlexPen Inject 35 Units into the skin 3 (three) times daily with meals. 08/09/15  Yes Vivi Barrack, MD  Insulin Glargine (LANTUS SOLOSTAR) 100 UNIT/ML Solostar Pen Inject 30 Units into the skin daily. 09/02/15  Yes Bufford Lope, DO  isosorbide mononitrate (IMDUR) 60 MG 24 hr tablet TAKE ONE (1) TABLET BY MOUTH EVERY DAY 08/02/15  Yes Rosemarie Ax, MD  levothyroxine (SYNTHROID, LEVOTHROID) 50 MCG tablet Take 1 tablet (50 mcg total) by mouth daily before breakfast. 09/05/15  Yes Lind Covert, MD  nitroGLYCERIN (NITROSTAT) 0.4 MG SL tablet Place 1 tablet (0.4 mg total) under the tongue every 5 (five) minutes as needed for chest pain. 06/06/15  Yes Mercy Riding, MD  pantoprazole (PROTONIX) 40 MG tablet Take 1 tablet (40 mg total) by mouth daily. 09/05/15  Yes Lind Covert, MD  SURE COMFORT PEN NEEDLES 31G X 8 MM MISC USE WITH BOTH INSULIN PENS TWICE DAILY (4 PER DAY) 06/14/15  Yes Olin Hauser, DO  torsemide (DEMADEX) 20 MG tablet Take 2 tablets (40 mg total) by mouth 2 (two) times daily. 09/05/15  Yes Lind Covert, MD  cloNIDine (CATAPRES) 0.2 MG tablet Take 1 tablet (0.2 mg total) by mouth 2 (two) times daily. 09/05/15   Lind Covert, MD     Social History   Social History  . Marital status: Single    Spouse name: N/A  . Number of children: N/A  . Years of education: N/A  Occupational History  . Not on file.   Social History Main Topics  . Smoking status: Former Smoker    Packs/day: 1.00    Years: 46.00    Types: Cigarettes    Start date: 02/04/1967    Quit date: 02/03/2013  . Smokeless tobacco: Never Used     Comment: Doing well with quitting.  . Alcohol use No  . Drug use: No  . Sexual activity: Not Currently   Other Topics Concern  . Not on file   Social History Narrative   Lives with sister, [name] in [place].  Retired - former Sports coach.    Family Status  Relation Status  . Mother Deceased  . Sister Alive  . Father Deceased  . Sister   . Neg Hx    Family History  Problem Relation Age of Onset  . Cancer Mother     lung cancer  . Hypertension Mother   . Cancer Sister     patient thinks it was uterine cancer  . Hypertension Sister   . Heart attack Sister   . Stroke Neg Hx        ROS:  Full 14 point review of systems complete and found to be negative unless listed above.  Physical Exam: Blood pressure 146/71, pulse 64, temperature 98.1 F (36.7 C), resp. rate 18, height 5\' 8"  (1.727 m), weight 215  lb (97.5 kg), SpO2 95 %.  General: Well developed, well nourished, male in no acute distress Head: Eyes PERRLA, No xanthomas. Normocephalic and atraumatic, oropharynx without edema or exudate.  Lungs: Resp regular and unlabored, CTA. Heart: RRR no s3, s4, or murmurs..   Neck: No carotid bruits. No lymphadenopathy.  JVD. Abdomen: Bowel sounds present, abdomen soft and non-tender without masses or hernias noted. Msk:  No spine or cva tenderness. No weakness, no joint deformities or effusions. Extremities: No clubbing, cyanosis or edema. DP/PT/Radials 2+ and equal bilaterally. Neuro: Alert and oriented X 3. No focal deficits noted. Psych:  Good affect, responds appropriately Skin: No rashes or lesions noted.  Labs:   Lab Results  Component Value Date   WBC 7.5 09/07/2015   HGB 9.0 (L) 09/07/2015   HCT 29.1 (L) 09/07/2015   MCV 76.0 (L) 09/07/2015   PLT 181 09/07/2015   No results for input(s): INR in the last 72 hours.  Recent Labs Lab 09/07/15 1153  NA 134*  K 3.8  CL 97*  CO2 23  BUN 92*  CREATININE 3.56*  CALCIUM 9.1  GLUCOSE 266*   Magnesium  Date Value Ref Range Status  08/29/2015 2.2 1.7 - 2.4 mg/dL Final   No results for input(s): CKTOTAL, CKMB, TROPONINI in the last 72 hours.  Recent Labs  09/07/15 1206  TROPIPOC 0.01   Pro B Natriuretic peptide (BNP)  Date/Time Value Ref Range Status  07/08/2013 09:05 PM 2,795.0 (H) 0 - 125 pg/mL Final  04/04/2013 06:25 AM 374.4 (H) 0 - 125 pg/mL Final   Lab Results  Component Value Date   CHOL 107 (L) 04/13/2015   HDL 22 (L) 04/13/2015   LDLCALC 38 04/13/2015   TRIG 234 (H) 04/13/2015   No results found for: DDIMER Lipase  Date/Time Value Ref Range Status  08/06/2015 05:30 PM 39 11 - 51 U/L Final   TSH  Date/Time Value Ref Range Status  06/18/2015 07:44 PM 12.559 (H) 0.350 - 4.500 uIU/mL Final  11/21/2014 03:32 PM 9.673 (H) 0.350 - 4.500 uIU/mL Final   T4, Total  Date/Time  Value Ref Range Status    06/18/2015 09:50 PM 5.3 4.5 - 12.0 ug/dL Final    Comment:    (NOTE) Performed At: [facility] [place], [place] [id number] Lindon Romp MD [id number]    T3, Free  Date/Time Value Ref Range Status  11/21/2014 03:32 PM 2.2 (L) 2.3 - 4.2 pg/mL Final   Vitamin B-12  Date/Time Value Ref Range Status  06/04/2015 11:28 AM 264 180 - 914 pg/mL Final    Comment:    (NOTE) This assay is not validated for testing neonatal or myeloproliferative syndrome specimens for Vitamin B12 levels.    Folate  Date/Time Value Ref Range Status  06/04/2015 11:28 AM 13.4 >5.9 ng/mL Final   Ferritin  Date/Time Value Ref Range Status  08/16/2015 03:54 PM 115 22 - 316 ng/ml Final   TIBC  Date/Time Value Ref Range Status  08/02/2015 10:20 AM 344 202 - 409 ug/dL Final   Iron  Date/Time Value Ref Range Status  08/02/2015 10:20 AM 32 (L) 42 - 163 ug/dL Final   Retic %  Date/Time Value Ref Range Status  08/16/2015 03:54 PM 1.56 0.80 - 1.80 % Final    Echo: 06/05/15 LV EF: 60% -   65%  ------------------------------------------------------------------- Indications:      Dyspnea 786.09.  ------------------------------------------------------------------- History:   Risk factors:  Hypertension. Diabetes mellitus.  ------------------------------------------------------------------- Study Conclusions  - Left ventricle: The cavity size was normal. Systolic function was   normal. The estimated ejection fraction was in the range of 60%   to 65%. Wall motion was normal; there were no regional wall   motion abnormalities. Features are consistent with a pseudonormal   left ventricular filling pattern, with concomitant abnormal   relaxation and increased filling pressure (grade 2 diastolic   dysfunction). Doppler parameters are consistent with high   ventricular filling pressure. - Aortic valve: Transvalvular velocity was within the normal range.   There was no  stenosis. There was trivial regurgitation. - Mitral valve: Calcified annulus. There was no regurgitation. - Right ventricle: The cavity size was normal. Wall thickness was   normal. Systolic function was normal. - Atrial septum: No defect or patent foramen ovale was identified. - Tricuspid valve: There was trivial regurgitation. - Pulmonary arteries: Systolic pressure was within the normal   range. PA peak pressure: 24 mm Hg (S). - Inferior vena cava: The vessel was dilated. The respirophasic   diameter changes were in the normal range (>= 50%), consistent   with normal central venous pressure. - Pericardium, extracardiac: There was a left pleural effusion.   ECG:  EKG shows sinus rhythm at Vent. rate 69 BPM PR interval * ms QRS duration 103 ms QT/QTc 412/442 ms P-R-T axes 5 24 53  Radiology:  Dg Chest 2 View  Result Date: 09/07/2015 CLINICAL DATA:  Centralized chest pressure. EXAM: CHEST  2 VIEW COMPARISON:  None. FINDINGS: The heart size is normal. Mild interstitial coarsening is chronic. Patient is status post median sternotomy for CABG. There is no edema or effusion to suggest failure. The visualized soft tissues and bony thorax are unremarkable. Vascular calcifications are present in the right axillary artery. IMPRESSION: No active cardiopulmonary disease. Electronically Signed   By: San Morelle M.D.   On: 09/07/2015 12:26    ASSESSMENT AND PLAN:      1. Chest pain  - Seems atypical, describe as "heartburn". Likely due to GERD. EKG without acute ischemic changes. Point-of-care troponin negative. Start Protonix. He can be  discharged home if delta troponin is negative.   2. Chronic diastolic congestive heart failure - Discharge weight of 226lb. Weight of 215 LB today. Euvolemic on exam. No shortness of breath or orthopnea. BNP minimally elevated at 115.3  - His baseline Scr appears to be 2.8 - 3.0 recently. Scr of 3.56 today. Hold torsemide tomorrow and resume at  Po 40  mg qd Sunday 09/09/15. Will repeat BMET next week during office visit.   3. Chronic kidney disease stage IV - Sees Dr. Marval Regal.  With frequent re-admissions, suspected he is rapidly approaching dialysis ( 6-12 months at most)   - As above  4. CAD s/p CABG 04/2013 - Continue aspirin, statin and Imdur.    5. DM - A1c of 6.5 09/01/15. Continue home regimen.   6. HTN - Stable. Continue current regimen.   Dispo: F/u with Dr. Johnsie Cancel 09/11/15 as previously scheduled.   SignedLeanor Kail, PA 09/07/2015, 2:54 PM  Co-Sign MD   Attending Note:   The patient was seen and examined.  Agree with assessment and plan as noted above.  Changes made to the above note as needed.  Patient seen and independently examined with Robbie Lis, PA .   We discussed all aspects of the encounter. I agree with the assessment and plan as stated above.  Pt has hx of CAD and CHF. Has been admitted multiple times this year Was just [place] several days ago Lasix was changed to Torsemide Had a brief episode of CP this am - resolved with ASA and has not recurred.  iinitial troponin is negative He is volume depleted on exam and by his BMP - creatinine is up to 3.5.  Would check a delta troponin.  If it is negative, he should be able to go home from the hospital I think he is volume depleted.  He has CKD stage III-IV at baseline. Will hold torsemide tomorrow and then restart Sunday at a lower dose - 40 mg a day  To see Dr. Johnsie Cancel next week   He still eats some salty foods.   Ive advised him to avoid salty foods    I have spent a total of 40 minutes with patient reviewing hospital  notes , telemetry, EKGs, labs and examining patient as well as establishing an assessment and plan that was discussed with the patient. > 50% of time was spent in direct patient care.    Thayer Headings, Brooke Bonito., MD, [facility] 09/07/2015, 4:07 PM [address]  [place] Pager [phone]

## 2015-09-07 NOTE — ED Provider Notes (Signed)
DEPT Provider Note   CSN: [id number] Arrival date & time: 09/07/15  1126  First Provider Contact:  First MD Initiated Contact with Patient 09/07/15 1139        History   Chief Complaint Chief Complaint  Patient presents with  . Chest Pain    HPI Leonard [name] is a 65 y.o. male.  Patient with past medical history of CAD, CHF, diabetes, hypertension, hyperlipidemia, prior CABG presents to the emergency department with chief complaint of chest pain. He states that the chest pain started around 10:00 today and lasted approximately a half hour. The pain subsided after taking nitroglycerin and aspirin. Patient states that he was at rest when the symptoms started. There was no exertional component. He denies any shortness of breath, radiating pain, diaphoresis, or nausea. Denies any abdominal pain. He states that he has had some mild swelling on his legs, but this is not new for him. He denies any symptoms now.   The history is provided by the patient. No language interpreter was used.    Past Medical History:  Diagnosis Date  . CHF (congestive heart failure) ([place])   . Gangrene of toe ([place])    right 5th/notes 01/04/2013   . High cholesterol   . Hypertension   . Iron deficiency anemia 08/03/2015  . 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 PAD (peripheral artery disease) ([place])   . Shortness of breath dyspnea   . Type II diabetes mellitus [facility])     Patient Active Problem List   Diagnosis Date Noted  . Acute on chronic systolic congestive heart failure ([place])   . SOB (shortness of breath) 08/28/2015  . CHF (congestive heart failure) ([place]) 08/27/2015  . Shortness of breath 08/27/2015  . Iron deficiency anemia 08/03/2015  . Claudication ([place])   . Type 2 diabetes mellitus with stage 3 chronic kidney disease, with long-term current use of insulin ([place])   . Hematuria 06/27/2015  . Knee pain   . Skin lesion of face 02/22/2015  . MGUS  (monoclonal gammopathy of unknown significance) 01/15/2015  . Anemia in chronic kidney disease 01/15/2015  . CKD (chronic kidney disease) stage 4, GFR 15-29 ml/min (H[facility]) 01/15/2015  . Hypothyroidism 11/22/2014  . Health care maintenance 11/22/2014  . Acute on chronic diastolic CHF (congestive heart failure) ([place]) 03/16/2014  . COPD (chronic obstructive pulmonary disease) ([place]) 02/16/2014  . History of tobacco use 08/23/2013  . 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 CAD (coronary artery disease) 04/06/2013  . PVD - hx of Rt SFA PTA and s/p Rt 4-5th toe amp Dec 2014 04/05/2013  . 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 Mixed hyperlipidemia 07/17/2008    Past Surgical History:  Procedure Laterality Date  . AMPUTATION Right 01/28/2013   Procedure: RAY AMPUTATION RIGHT  4th & 5th TOE;  Surgeon: Rosetta Posner, MD;  Location[facility] OR;  Service: Vascular;  Laterality: Right;  . ANGIOPLASTY / STENTING FEMORAL Right 01/13/2013   superficial femoral artery x 2 (3 mm x 60 mm, 4 mm x 60 mm)  . CORONARY ARTERY BYPASS GRAFT N/A 04/07/2013   Procedure: CORONARY ARTERY BYPASS GRAFTING (CABG);  Surgeon: Ivin Poot, MD;  Location: [place];  Service: Open Heart Surgery;  Laterality: N/A;  . INTRAOPERATIVE TRANSESOPHAGEAL ECHOCARDIOGRAM N/A 04/07/2013   Procedure: INTRAOPERATIVE TRANSESOPHAGEAL ECHOCARDIOGRAM;  Surgeon: Ivin Poot, MD;  Location: [place];  Service: Open Heart Surgery;  Laterality: N/A;  . LEFT HEART CATHETERIZATION WITH CORONARY ANGIOGRAM N/A  04/05/2013   Procedure: LEFT HEART CATHETERIZATION WITH CORONARY ANGIOGRAM;  Surgeon: Peter M Martinique, MD;  Location: [facility] CATH LAB;  Service: Cardiovascular;  Laterality: N/A;  . LOWER EXTREMITY ANGIOGRAM Bilateral 01/06/2013   Procedure: LOWER EXTREMITY ANGIOGRAM;  Surgeon: Conrad [place]burg, MD;  Location: [facility] CATH LAB;  Service: Cardiovascular;  Laterality: Bilateral;  . LOWER EXTREMITY ANGIOGRAM Right 01/13/2013   Procedure: LOWER EXTREMITY ANGIOGRAM;  Surgeon: Conrad [place],  MD;  Location: [facility] CATH LAB;  Service: Cardiovascular;  Laterality: Right;  . SKIN CANCER EXCISION  2017  . THROAT SURGERY  1983   polyps  . TONSILLECTOMY AND ADENOIDECTOMY  ~ 1965       Home Medications    Prior to Admission medications   Medication Sig Start Date End Date Taking? Authorizing Provider  ADVAIR DISKUS 250-50 MCG/DOSE AEPB INHALE 1 PUFF INTO THE LUNGS 2 TIMES DAILY 06/25/15  Yes Rosemarie Ax, MD  albuterol (PROVENTIL HFA;VENTOLIN HFA) 108 (90 Base) MCG/ACT inhaler Inhale 2 puffs into the lungs every 6 (six) hours as needed for wheezing or shortness of breath. 06/12/15  Yes Hillary Corinda Gubler, MD  amLODipine (NORVASC) 10 MG tablet Take 1 tablet (10 mg total) by mouth daily. 09/05/15  Yes [place] Covert, MD  aspirin EC 81 MG EC tablet Take 1 tablet (81 mg total) by mouth daily. 06/06/15  Yes Mercy Riding, MD  atorvastatin (LIPITOR) 40 MG tablet TAKE 1 TABLET BY MOUTH EVERY DAY AT 6 PM 08/09/15  Yes Vivi Barrack, MD  carvedilol (COREG) 25 MG tablet Take 2 tablets (50 mg total) by mouth 2 (two) times daily with a meal. 08/09/15  Yes Vivi Barrack, MD  doxazosin (CARDURA) 2 MG tablet TAKE ONE TABLET BY MOUTH EVERY TWELVE HOURS 08/16/15  Yes [name], MD  hydrALAZINE (APRESOLINE) 100 MG tablet TAKE 1 TABLET (100MG ) BY MOUTH EVERY 8 HOURS 07/31/15  Yes Rosemarie Ax, MD  insulin aspart (NOVOLOG FLEXPEN) 100 UNIT/ML FlexPen Inject 35 Units into the skin 3 (three) times daily with meals. 08/09/15  Yes Vivi Barrack, MD  Insulin Glargine (LANTUS SOLOSTAR) 100 UNIT/ML Solostar Pen Inject 30 Units into the skin daily. 09/02/15  Yes Bufford Lope, DO  isosorbide mononitrate (IMDUR) 60 MG 24 hr tablet TAKE ONE (1) TABLET BY MOUTH EVERY DAY 08/02/15  Yes Rosemarie Ax, MD  levothyroxine (SYNTHROID, LEVOTHROID) 50 MCG tablet Take 1 tablet (50 mcg total) by mouth daily before breakfast. 09/05/15  Yes [place] Covert, MD  nitroGLYCERIN (NITROSTAT) 0.4 MG SL tablet Place 1  tablet (0.4 mg total) under the tongue every 5 (five) minutes as needed for chest pain. 06/06/15  Yes Mercy Riding, MD  pantoprazole (PROTONIX) 40 MG tablet Take 1 tablet (40 mg total) by mouth daily. 09/05/15  Yes [place] Covert, MD  SURE COMFORT PEN NEEDLES 31G X 8 MM MISC USE WITH BOTH INSULIN PENS TWICE DAILY (4 PER DAY) 06/14/15  Yes Olin Hauser, DO  torsemide (DEMADEX) 20 MG tablet Take 2 tablets (40 mg total) by mouth 2 (two) times daily. 09/05/15  Yes [place] Covert, MD  cloNIDine (CATAPRES) 0.2 MG tablet Take 1 tablet (0.2 mg total) by mouth 2 (two) times daily. 09/05/15   [place] Covert, MD    Family History Family History  Problem Relation Age of Onset  . Cancer Mother     lung cancer  . Hypertension Mother   . Cancer Sister  patient thinks it was uterine cancer  . Hypertension Sister   . Heart attack Sister   . Stroke Neg Hx     Social History Social History  Substance Use Topics  . Smoking status: Former Smoker    Packs/day: 1.00    Years: 46.00    Types: Cigarettes    Start date: 02/04/1967    Quit date: 02/03/2013  . Smokeless tobacco: Never Used     Comment: Doing well with quitting.  . Alcohol use No     Allergies   Review of patient's allergies indicates no known allergies.   Review of Systems Review of Systems  All other systems reviewed and are negative.    Physical Exam Updated Vital Signs BP 150/62 (BP Location: Right Arm)   Pulse 69   Temp 98.1 F (36.7 C)   Resp 10   Ht 5\' 8"  (1.727 m)   Wt 97.5 kg   SpO2 98%   BMI 32.69 kg/m   Physical Exam  Constitutional: He is oriented to person, place, and time. He appears well-developed and well-nourished.  HENT:  Head: Normocephalic and atraumatic.  Eyes: Conjunctivae and EOM are normal. Pupils are equal, round, and reactive to light. Right eye exhibits no discharge. Left eye exhibits no discharge. No scleral icterus.  Neck: Normal range of motion. Neck supple. No JVD  present.  Cardiovascular: Normal rate, regular rhythm and normal heart sounds.  Exam reveals no gallop and no friction rub.   No murmur heard. Pulmonary/Chest: Effort normal and breath sounds normal. No respiratory distress. He has no wheezes. He has no rales. He exhibits no tenderness.  Abdominal: Soft. He exhibits no distension and no mass. There is no tenderness. There is no rebound and no guarding.  Musculoskeletal: Normal range of motion. He exhibits no edema or tenderness.  Neurological: He is alert and oriented to person, place, and time.  Skin: Skin is warm and dry.  Psychiatric: He has a normal mood and affect. His behavior is normal. Judgment and thought content normal.  Nursing note and vitals reviewed.    ED Treatments / Results  Labs (all labs ordered are listed, but only abnormal results are displayed) Labs Reviewed  CBC WITH DIFFERENTIAL/PLATELET - Abnormal; Notable for the following:       Result Value   RBC 3.83 (*)    Hemoglobin 9.0 (*)    HCT 29.1 (*)    MCV 76.0 (*)    MCH 23.5 (*)    RDW 20.1 (*)    Lymphs Abs 0.6 (*)    All other components within normal limits  BASIC METABOLIC PANEL - Abnormal; Notable for the following:    Sodium 134 (*)    Chloride 97 (*)    Glucose, Bld 266 (*)    BUN 92 (*)    Creatinine, Ser 3.56 (*)    GFR calc non Af Amer 17 (*)    GFR calc Af Amer 19 (*)    All other components within normal limits  BRAIN NATRIURETIC PEPTIDE - Abnormal; Notable for the following:    B Natriuretic Peptide 115.3 (*)    All other components within normal limits  I-STAT TROPOININ, ED    EKG  EKG Interpretation None       Radiology Dg Chest 2 View  Result Date: 09/07/2015 CLINICAL DATA:  Centralized chest pressure. EXAM: CHEST  2 VIEW COMPARISON:  None. FINDINGS: The heart size is normal. Mild interstitial coarsening is chronic. Patient is status post  median sternotomy for CABG. There is no edema or effusion to suggest failure. The  visualized soft tissues and bony thorax are unremarkable. Vascular calcifications are present in the right axillary artery. IMPRESSION: No active cardiopulmonary disease. Electronically Signed   By: San Morelle M.D.   On: 09/07/2015 12:26    Procedures Procedures (including critical care time)  Medications Ordered in ED Medications - No data to display   Initial Impression / Assessment and Plan / ED Course  I have reviewed the triage vital signs and the nursing notes.  Pertinent labs & imaging results that were available during my care of the patient were reviewed by me and considered in my medical decision making (see chart for details).  Clinical Course    Patient with extensive history of heart disease including CABG presents with chest pain this morning. Symptoms lasted approximately 30 minutes and resolved after aspirin and nitroglycerin. He is chest pain-free now. Laboratory workup shows normal troponin, increase in creatinine from baseline, patient will need to discuss this with his primary care provider. No new EKG changes or significant chest x-ray findings. Patient discussed with Dr. Ellender Hose, who recommends cardiology consultation.  2:38 PM Discussed the patient with cardiology, who will see the patient.  Cardiology recommends delta troponin.  States that symptoms sound like GERD and recommend discharge on protonix pending negative delta troponin.  3:53 PM Delta trop is negative.  Final Clinical Impressions(s) / ED Diagnoses   Final diagnoses:  Chest pain, unspecified chest pain type    New Prescriptions New Prescriptions   No medications on file       Montine Circle, PA-C 09/07/15 [place], MD 09/09/15 1121

## 2015-09-07 NOTE — ED Notes (Signed)
EDP at bedside  

## 2015-09-07 NOTE — ED Triage Notes (Signed)
Pt. Coming from home via [facility] for central chest pressure starting about an hour ago with radiation to epigastric area. Pt. Took 3nitro at home with no relief. Pt. Given 324 ASA en route and now sts he has no chest pain. Pt. Hx of CHF and heart attack. Pt. Skin dry and warm, Aox4.

## 2015-09-07 NOTE — ED Notes (Signed)
Cardiology at bedside.

## 2015-09-10 NOTE — Progress Notes (Signed)
Cardiology Office Note   Date:  09/11/2015   ID:  Leonard [name], DOB [birth year], MRN [id number]  PCP:  [name], MD  Cardiologist: Darlin Coco MD  No chief complaint on file.     History of Present Illness:  Leonard [name] is a 65 y.o. . male who presents for follow-up office visit. He is a medical patient of Dr. Lacinda Axon of the [facility]. New to me previously seen by Dr Mare Ferrari Patient has known ischemic heart disease. He underwent coronary artery bypass graft surgery 3 on 04/07/13 I Dr. Prescott Gum. Patient also has a history of peripheral arterial occlusive disease. He had a prior stent to his right femoral artery in December 2014. He has a history of diabetes and a history of hypertension. He has had previous admissions for exacerbation of heart failure. His most recent echocardiogram on  06/05/15  showed normal systolic function with ejection fraction of 60-65% and mild LVH and there was grade 2 diastolic dysfunction. The patient has mild renal insufficiency. He is now being followed by nephrology.  His spironolactone and his ACE inhibitor have been held. The patient has not been experiencing any recurrent chest pain or angina. He sleeps on one pillow. He denies any paroxysmal nocturnal dyspnea. The patient is on home oxygen at 2 L a minute around the clock. The patient has a history of chronic anemia. He was a previous heavy smoker. He quit smoking about a year ago. Since last visit he has not had any new cardiac symptoms.  He has only trace edema. He is trying to stay away from potato chips and sodas.  Nephrology has recommended a 1200 mL per day fluid intake He denies any chest pain or shortness of breath. He states that he has whitecoat hypertension. He states that at home his blood pressure is normal  Duplex 07/2015 ABI .57 right .48 on left with possible short segment occlusion of RFA on right  Seen in ER 09/07/15 by Dr Acie Fredrickson  Chest pain R/O multiple admissions For CHF due to worsening renal failure. Cr 3.5 Sees [facility] and will likely need dialysis soon   Past Medical History:  Diagnosis Date  . CHF (congestive heart failure) ([place])   . Gangrene of toe ([place])    right 5th/notes 01/04/2013   . High cholesterol   . Hypertension   . Iron deficiency anemia 08/03/2015  . 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 PAD (peripheral artery disease) ([place])   . Shortness of breath dyspnea   . Type II diabetes mellitus ([place])     Past Surgical History:  Procedure Laterality Date  . AMPUTATION Right 01/28/2013   Procedure: RAY AMPUTATION RIGHT  4th & 5th TOE;  Surgeon: Rosetta Posner, MD;  [facility];  Service: Vascular;  Laterality: Right;  . ANGIOPLASTY / STENTING FEMORAL Right 01/13/2013   superficial femoral artery x 2 (3 mm x 60 mm, 4 mm x 60 mm)  . CORONARY ARTERY BYPASS GRAFT N/A 04/07/2013   Procedure: CORONARY ARTERY BYPASS GRAFTING (CABG);  Surgeon: Ivin Poot, MD;  Location: [place];  Service: Open Heart Surgery;  Laterality: N/A;  . INTRAOPERATIVE TRANSESOPHAGEAL ECHOCARDIOGRAM N/A 04/07/2013   Procedure: INTRAOPERATIVE TRANSESOPHAGEAL ECHOCARDIOGRAM;  Surgeon: Ivin Poot, MD;  Location: [place];  Service: Open Heart Surgery;  Laterality: N/A;  . LEFT HEART CATHETERIZATION WITH CORONARY ANGIOGRAM N/A 04/05/2013   Procedure: LEFT HEART CATHETERIZATION WITH CORONARY ANGIOGRAM;  Surgeon:  Yeriel Mineo M Martinique, MD;  Location: [facility] CATH LAB;  Service: Cardiovascular;  Laterality: N/A;  . LOWER EXTREMITY ANGIOGRAM Bilateral 01/06/2013   Procedure: LOWER EXTREMITY ANGIOGRAM;  Surgeon: Conrad [place], MD;  Location: [facility] CATH LAB;  Service: Cardiovascular;  Laterality: Bilateral;  . LOWER EXTREMITY ANGIOGRAM Right 01/13/2013   Procedure: LOWER EXTREMITY ANGIOGRAM;  Surgeon: Conrad , MD;  Location: [facility] CATH LAB;  Service: Cardiovascular;  Laterality: Right;  . SKIN CANCER EXCISION  2017  . THROAT  SURGERY  1983   polyps  . TONSILLECTOMY AND ADENOIDECTOMY  ~ 1965     Current Outpatient Prescriptions  Medication Sig Dispense Refill  . ADVAIR DISKUS 250-50 MCG/DOSE AEPB INHALE 1 PUFF INTO THE LUNGS 2 TIMES DAILY 60 each 1  . albuterol (PROVENTIL HFA;VENTOLIN HFA) 108 (90 Base) MCG/ACT inhaler Inhale 2 puffs into the lungs every 6 (six) hours as needed for wheezing or shortness of breath. 1 Inhaler 2  . amLODipine (NORVASC) 10 MG tablet Take 1 tablet (10 mg total) by mouth daily. 30 tablet 1  . aspirin EC 81 MG EC tablet Take 1 tablet (81 mg total) by mouth daily. 30 tablet 0  . atorvastatin (LIPITOR) 40 MG tablet TAKE 1 TABLET BY MOUTH EVERY DAY AT 6 PM 30 tablet 0  . carvedilol (COREG) 25 MG tablet Take 2 tablets (50 mg total) by mouth 2 (two) times daily with a meal. 60 tablet 0  . cloNIDine (CATAPRES) 0.2 MG tablet Take 1 tablet (0.2 mg total) by mouth 2 (two) times daily. 60 tablet 1  . doxazosin (CARDURA) 2 MG tablet TAKE ONE TABLET BY MOUTH EVERY TWELVE HOURS 60 tablet 1  . hydrALAZINE (APRESOLINE) 100 MG tablet TAKE 1 TABLET (100MG ) BY MOUTH EVERY 8 HOURS 90 tablet 1  . insulin aspart (NOVOLOG FLEXPEN) 100 UNIT/ML FlexPen Inject 35 Units into the skin 3 (three) times daily with meals. 15 mL 0  . Insulin Glargine (LANTUS SOLOSTAR) 100 UNIT/ML Solostar Pen Inject 30 Units into the skin daily. 15 mL 2  . isosorbide mononitrate (IMDUR) 60 MG 24 hr tablet TAKE ONE (1) TABLET BY MOUTH EVERY DAY 30 tablet 3  . levothyroxine (SYNTHROID, LEVOTHROID) 50 MCG tablet Take 1 tablet (50 mcg total) by mouth daily before breakfast. 30 tablet 2  . nitroGLYCERIN (NITROSTAT) 0.4 MG SL tablet Place 1 tablet (0.4 mg total) under the tongue every 5 (five) minutes as needed for chest pain. 30 tablet 12  . pantoprazole (PROTONIX) 40 MG tablet Take 1 tablet (40 mg total) by mouth daily. 30 tablet 1  . SURE COMFORT PEN NEEDLES 31G X 8 MM MISC USE WITH BOTH INSULIN PENS TWICE DAILY (4 PER DAY) 100 each 5  .  torsemide (DEMADEX) 20 MG tablet Take 2 tablets (40 mg total) by mouth once. 120 tablet 1   No current facility-administered medications for this visit.     Allergies:   Review of patient's allergies indicates no known allergies.    Social History:  The patient  reports that he quit smoking about 2 years ago. His smoking use included Cigarettes. He started smoking about 48 years ago. He has a 46.00 pack-year smoking history. He has never used smokeless tobacco. He reports that he does not drink alcohol or use drugs.   Family History:  The patient's family history includes Cancer in his mother and sister; Heart attack in his sister; Hypertension in his mother and sister.    ROS:  Please see the history of  present illness.   Otherwise, review of systems are positive for none.   All other systems are reviewed and negative.    PHYSICAL EXAM: VS:  BP 127/64   Pulse 71   Ht 5\' 8"  (1.727 m)   Wt 219 lb 1.9 oz (99.4 kg)   BMI 33.32 kg/m  , BMI Body mass index is 33.32 kg/m. GEN: Well nourished, well developed, in no acute distress  HEENT: normal  Neck: no JVD, carotid bruits, or masses Cardiac: RRR; there is a grade 1/6 systolic ejection murmur at the base.  There is no gallop.  There is trace edema. 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    Recent Labs: 06/18/2015: TSH 12.559 08/16/2015: ALT 13 08/29/2015: Magnesium 2.2 09/07/2015: B Natriuretic Peptide 115.3; BUN 92; Creatinine, Ser 3.56; Hemoglobin 9.0; Platelets 181; Potassium 3.8; Sodium 134    Lipid Panel    Component Value Date/Time   CHOL 107 (L) 04/13/2015 1201   TRIG 234 (H) 04/13/2015 1201   HDL 22 (L) 04/13/2015 1201    CHOLHDL 4.9 04/13/2015 1201   VLDL 47 (H) 04/13/2015 1201   LDLCALC 38 04/13/2015 1201      Wt Readings from Last 3 Encounters:  09/11/15 219 lb 1.9 oz (99.4 kg)  09/07/15 215 lb (97.5 kg)  09/05/15 222 lb (100.7 kg)        ASSESSMENT AND PLAN:  1. Chronic diastolic heart failure with preserved ejection fraction. He currently appears to be euvolemic  2. Ischemic heart disease status post CABG in March 2015. No recurrent angina. No cath due to renal failure   3. Renal insufficiency decrease demedex to 40 daily from bid Called Dr Eudelia Bunch Office to see him soon  4. Diabetes mellitus type 2 Discussed low carb diet.  Target hemoglobin A1c is 6.5 or less.  Continue current medications.  5. Essential hypertension Well controlled.  Continue current medications and low sodium Dash type diet.    6. Anemia of chronic disease and renal failure has appt with Dr Michail Sermon for EGD and colon in October  7. 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 ABI's much worse and ? Short segment occlusion called [facility] to get him F/u With Dr Bridgett Larsson. Moderate claudication    Current medicines are reviewed at length with the patient today.  The patient does not have concerns regarding medicines.  The following changes have been made:  Decrease demedex to daily from bid   Labs/ tests ordered today include:   Orders Placed This Encounter  Procedures  . Ambulatory referral to Vascular Surgery     Position: The patient is to continue current medication.  He will be followed closely with nephrology. Continue to hold ACE and aldactone due to CRF   Jenkins Rouge

## 2015-09-11 ENCOUNTER — Encounter (INDEPENDENT_AMBULATORY_CARE_PROVIDER_SITE_OTHER): Payer: Self-pay

## 2015-09-11 ENCOUNTER — Ambulatory Visit (INDEPENDENT_AMBULATORY_CARE_PROVIDER_SITE_OTHER): Payer: Medicaid Other | Admitting: Cardiovascular Disease

## 2015-09-11 ENCOUNTER — Encounter: Payer: Self-pay | Admitting: Cardiovascular Disease

## 2015-09-11 VITALS — BP 127/64 | HR 71 | Ht 68.0 in | Wt 219.1 lb

## 2015-09-11 DIAGNOSIS — I739 Peripheral vascular disease, unspecified: Secondary | ICD-10-CM | POA: Diagnosis not present

## 2015-09-11 MED ORDER — TORSEMIDE 20 MG PO TABS: 40.0000 mg | ORAL_TABLET | Freq: Once | ORAL | 1 refills | Status: DC

## 2015-09-11 NOTE — Patient Instructions (Addendum)
Medication Instructions:  Your physician has recommended you make the following change in your medication:  1-Decrease Demadex 40 mg by mouth daily  Labwork: NONE  Testing/Procedures: NONE  Follow-Up: Your physician wants you to follow-up in: 3 months with Dr. Johnsie Cancel.  Your physician recommends that you schedule a follow-up appointment in: 1 to 2 weeks with [facility]. They will be calling you today about your recent lab work.  Your physician recommends that you schedule a follow-up appointment with Dr. Bridgett Larsson for severe PVD vascular disease.   If you need a refill on your cardiac medications before your next appointment, please call your pharmacy.

## 2015-09-13 ENCOUNTER — Other Ambulatory Visit (HOSPITAL_BASED_OUTPATIENT_CLINIC_OR_DEPARTMENT_OTHER): Payer: Medicaid Other

## 2015-09-13 ENCOUNTER — Ambulatory Visit (HOSPITAL_BASED_OUTPATIENT_CLINIC_OR_DEPARTMENT_OTHER): Payer: Medicaid Other

## 2015-09-13 VITALS — BP 139/73 | HR 60 | Temp 97.5°F | Resp 16

## 2015-09-13 DIAGNOSIS — N189 Chronic kidney disease, unspecified: Secondary | ICD-10-CM

## 2015-09-13 DIAGNOSIS — D631 Anemia in chronic kidney disease: Secondary | ICD-10-CM | POA: Diagnosis not present

## 2015-09-13 LAB — CBC & DIFF AND RETIC
BASO%: 0.3 % (ref 0.0–2.0)
Basophils Absolute: 0 10*3/uL (ref 0.0–0.1)
EOS%: 1.7 % (ref 0.0–7.0)
Eosinophils Absolute: 0.1 10*3/uL (ref 0.0–0.5)
HCT: 28.5 % — ABNORMAL LOW (ref 38.4–49.9)
HGB: 8.9 g/dL — ABNORMAL LOW (ref 13.0–17.1)
Immature Retic Fract: 3.5 % (ref 3.00–10.60)
LYMPH#: 0.9 10*3/uL (ref 0.9–3.3)
LYMPH%: 11.7 % — AB (ref 14.0–49.0)
MCH: 23.2 pg — ABNORMAL LOW (ref 27.2–33.4)
MCHC: 31.2 g/dL — AB (ref 32.0–36.0)
MCV: 74.4 fL — AB (ref 79.3–98.0)
MONO#: 0.9 10*3/uL (ref 0.1–0.9)
MONO%: 12.2 % (ref 0.0–14.0)
NEUT%: 74.1 % (ref 39.0–75.0)
NEUTROS ABS: 5.6 10*3/uL (ref 1.5–6.5)
PLATELETS: 238 10*3/uL (ref 140–400)
RBC: 3.83 10*6/uL — AB (ref 4.20–5.82)
RDW: 19.5 % — ABNORMAL HIGH (ref 11.0–14.6)
RETIC CT ABS: 23.36 10*3/uL — AB (ref 34.80–93.90)
Retic %: 0.61 % — ABNORMAL LOW (ref 0.80–1.80)
WBC: 7.6 10*3/uL (ref 4.0–10.3)

## 2015-09-13 MED ORDER — DARBEPOETIN ALFA 500 MCG/ML IJ SOSY
500.0000 ug | PREFILLED_SYRINGE | Freq: Once | INTRAMUSCULAR | Status: AC
  Administered 2015-09-13: 500 ug via SUBCUTANEOUS
  Filled 2015-09-13: qty 1

## 2015-09-13 NOTE — Patient Instructions (Signed)
Darbepoetin Alfa injection What is this medicine? DARBEPOETIN ALFA (dar be POE e tin AL fa) helps your body make more red blood cells. It is used to treat anemia caused by chronic kidney failure and chemotherapy. This medicine may be used for other purposes; ask your health care provider or pharmacist if you have questions. What should I tell my health care provider before I take this medicine? They need to know if you have any of these conditions: -blood clotting disorders or history of blood clots -cancer patient not on chemotherapy -cystic fibrosis -heart disease, such as angina, heart failure, or a history of a heart attack -hemoglobin level of 12 g/dL or greater -high blood pressure -low levels of folate, iron, or vitamin B12 -seizures -an unusual or allergic reaction to darbepoetin, erythropoietin, albumin, hamster proteins, latex, other medicines, foods, dyes, or preservatives -pregnant or trying to get pregnant -breast-feeding How should I use this medicine? This medicine is for injection into a vein or under the skin. It is usually given by a health care professional in a hospital or clinic setting. If you get this medicine at home, you will be taught how to prepare and give this medicine. Do not shake the solution before you withdraw a dose. Use exactly as directed. Take your medicine at regular intervals. Do not take your medicine more often than directed. It is important that you put your used needles and syringes in a special sharps container. Do not put them in a trash can. If you do not have a sharps container, call your pharmacist or healthcare provider to get one. Talk to your pediatrician regarding the use of this medicine in children. While this medicine may be used in children as young as 1 year for selected conditions, precautions do apply. Overdosage: If you think you have taken too much of this medicine contact a poison control center or emergency room at once. NOTE:  This medicine is only for you. Do not share this medicine with others. What if I miss a dose? If you miss a dose, take it as soon as you can. If it is almost time for your next dose, take only that dose. Do not take double or extra doses. What may interact with this medicine? Do not take this medicine with any of the following medications: -epoetin alfa This list may not describe all possible interactions. Give your health care provider a list of all the medicines, herbs, non-prescription drugs, or dietary supplements you use. Also tell them if you smoke, drink alcohol, or use illegal drugs. Some items may interact with your medicine. What should I watch for while using this medicine? Visit your prescriber or health care professional for regular checks on your progress and for the needed blood tests and blood pressure measurements. It is especially important for the doctor to make sure your hemoglobin level is in the desired range, to limit the risk of potential side effects and to give you the best benefit. Keep all appointments for any recommended tests. Check your blood pressure as directed. Ask your doctor what your blood pressure should be and when you should contact him or her. As your body makes more red blood cells, you may need to take iron, folic acid, or vitamin B supplements. Ask your doctor or health care provider which products are right for you. If you have kidney disease continue dietary restrictions, even though this medication can make you feel better. Talk with your doctor or health care professional about the   foods you eat and the vitamins that you take. What side effects may I notice from receiving this medicine? Side effects that you should report to your doctor or health care professional as soon as possible: -allergic reactions like skin rash, itching or hives, swelling of the face, lips, or tongue -breathing problems -changes in vision -chest pain -confusion, trouble speaking  or understanding -feeling faint or lightheaded, falls -high blood pressure -muscle aches or pains -pain, swelling, warmth in the leg -rapid weight gain -severe headaches -sudden numbness or weakness of the face, arm or leg -trouble walking, dizziness, loss of balance or coordination -seizures (convulsions) -swelling of the ankles, feet, hands -unusually weak or tired Side effects that usually do not require medical attention (report to your doctor or health care professional if they continue or are bothersome): -diarrhea -fever, chills (flu-like symptoms) -headaches -nausea, vomiting -redness, stinging, or swelling at site where injected This list may not describe all possible side effects. Call your doctor for medical advice about side effects. You may report side effects to FDA at 1-800-FDA-1088. Where should I keep my medicine? Keep out of the reach of children. Store in a refrigerator between 2 and 8 degrees C (36 and 46 degrees F). Do not freeze. Do not shake. Throw away any unused portion if using a single-dose vial. Throw away any unused medicine after the expiration date. NOTE: This sheet is a summary. It may not cover all possible information. If you have questions about this medicine, talk to your doctor, pharmacist, or health care provider.    2016, [employer]. (2008-01-04 10:23:57)  

## 2015-09-14 ENCOUNTER — Encounter: Payer: Self-pay | Admitting: Pharmacist

## 2015-09-14 ENCOUNTER — Ambulatory Visit (INDEPENDENT_AMBULATORY_CARE_PROVIDER_SITE_OTHER): Payer: Medicaid Other | Admitting: Pharmacist

## 2015-09-14 DIAGNOSIS — I10 Essential (primary) hypertension: Secondary | ICD-10-CM | POA: Diagnosis not present

## 2015-09-14 DIAGNOSIS — N183 Chronic kidney disease, stage 3 (moderate): Secondary | ICD-10-CM | POA: Diagnosis not present

## 2015-09-14 DIAGNOSIS — Z794 Long term (current) use of insulin: Secondary | ICD-10-CM | POA: Diagnosis not present

## 2015-09-14 DIAGNOSIS — E1122 Type 2 diabetes mellitus with diabetic chronic kidney disease: Secondary | ICD-10-CM

## 2015-09-14 NOTE — Progress Notes (Signed)
    S:    Patient arrives accompanied by his sister Leonard [name].  She assists with medication adherence and helps the pill box medication assistant who routinely fills patient's pill box.   Patient reports Diabetes has had improved control with improved adherence.  He continues to take lantus 65 units and Novolog 35 units prior to meals.   Patient reports adherence with medications.    Patient denies hypoglycemic events.  O:  Lab Results  Component Value Date   HGBA1C 6.5 (H) 09/01/2015   Vitals:   09/14/15 1028  BP: (!) 143/58    Home fasting CBG: 210 this AM -  A/P: Diabetes longstanding much improved based on A1c. Likely this is due to improve adherence with medication with use of pill box monitoring.   Continue taking same dose Novolog 35 units prior to meals and Lantus - reduce from 65 to 60 units once daily.  Reevaluate blood glucose at next visit.  Consider further reduction if he has any blood sugar readings consistently < 100.   Blood pressure is at goal and patient weight appears to be near dry weight of 218 pounds.   Denied excess dizziness or any falls.   Advised to inform us if he feels light-headed more often.  Reduced frequency of dosing torsemide from twice to once daily 40mg  by Dr. Johnsie Cancel yesterday. - reevaluate at follow up.   Written patient instructions provided.  Total time in face to face counseling 45 minutes.   Follow up in Pharmacist Clinic Visit prn.  Patient seen with Mechele Dawley, PharmD Candidate and Beryl Meager, MD Resident.

## 2015-09-14 NOTE — Assessment & Plan Note (Addendum)
Diabetes longstanding much improved based on A1c. Likely this is due to improve adherence with medication with use of pill box monitoring.   Continue taking same dose Novolog 35 units prior to meals and Lantus - reduce from 65 to 60 units once daily.  Reevaluate blood glucose at next visit.  Consider further reduction if he has any blood sugar readings consistently < 100.

## 2015-09-14 NOTE — Patient Instructions (Signed)
Great to see you today.   Keep up the great work you have been doing.   Keep taking your novolog at 35 units prior to meals.    Please take Lantus at 60 units once a day.  This is less than the dose you have been taking.   No other changes today.     Next follow up with Dr. Juanito Doom.

## 2015-09-14 NOTE — Assessment & Plan Note (Signed)
Blood pressure is at goal and patient weight appears to be near dry weight of 218 pounds.   Denied excess dizziness or any falls.   Advised to inform us if he feels light-headed more often.  Reduced frequency of dosing torsemide from twice to once daily 40mg  by Dr. Johnsie Cancel yesterday - reevaluate at follow up.

## 2015-09-17 NOTE — Progress Notes (Signed)
Patient ID: Leonard [name], male   DOB: 10/23/1950, 65 y.o.   MRN: [id number] Reviewed: Agree with Dr. Koval's documentation and management. 

## 2015-09-20 ENCOUNTER — Telehealth: Payer: Self-pay

## 2015-09-20 ENCOUNTER — Telehealth: Payer: Self-pay | Admitting: Cardiovascular Disease

## 2015-09-20 NOTE — Telephone Encounter (Signed)
Larena Glassman from [facility] called. Leonard [name] is having a steady weight gain. 218 Tues 222 today. Lasix was reduced form 40 mg 2 x daily to 40 mg once a day. Larena Glassman would like to know what she should do. She is worried that if something isn't done he will end up back in the hospital this weekend. Please call her @ [phone] to discuss. Ottis Stain, CMA

## 2015-09-20 NOTE — Telephone Encounter (Signed)
Per Dr. Kyla Balzarine office note: 7. 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 ABI's much worse and ? Short segment occlusion called [facility] to get him F/u With Dr Bridgett Larsson. Moderate claudication

## 2015-09-20 NOTE — Telephone Encounter (Signed)
Left message for Sharyn Lull at Dr. Lianne Moris office to call back.

## 2015-09-20 NOTE — Telephone Encounter (Signed)
Called Larena Glassman back from [place]. No answer, left voicemail stating that I would like for patient to come into the clinic tomorrow to be seen. Unable to determine fluid status or medication changes over the phone.  If unable to get appointment tomorrow, please talk to [facility] and get preceptor to evaluate. Thank you.   Smitty Cords, MD [place], PGY-2

## 2015-09-20 NOTE — Telephone Encounter (Signed)
Follow Up:   Pt was referred by Dr Johnsie Cancel to them for PVD. Pt is already being followed by Dr Gwenyth Bender was seen on 07-24-15. Pt will return for his next office visit in 24mos(September).

## 2015-09-21 NOTE — Telephone Encounter (Signed)
Spoke to Dr. Ree Kida about this pt. Dr. Ree Kida asked me to call the pt and see if he could come in for a nurse visit on Monday a.m. And to let the pt know that if he feels more SOB over the weekend he should go to the hospital. Called the pt but he had gone for a walk. I spoke to pt sister. Gave her the info. She said pt has appointments Monday and she didn't know if he can get here. I told her to have him come by when he can and we will try to get him for a weight check as quick as possible.  I told her I was keeping the appt for Tuesday scheduled so he can meet with Dr. Juanito Doom. Ottis Stain, CMA

## 2015-09-24 ENCOUNTER — Other Ambulatory Visit: Payer: Self-pay | Admitting: Internal Medicine

## 2015-09-24 DIAGNOSIS — J449 Chronic obstructive pulmonary disease, unspecified: Secondary | ICD-10-CM

## 2015-09-25 ENCOUNTER — Ambulatory Visit (INDEPENDENT_AMBULATORY_CARE_PROVIDER_SITE_OTHER): Payer: Medicaid Other | Admitting: Family Medicine

## 2015-09-25 ENCOUNTER — Other Ambulatory Visit: Payer: Self-pay | Admitting: *Deleted

## 2015-09-25 ENCOUNTER — Encounter: Payer: Self-pay | Admitting: Family Medicine

## 2015-09-25 ENCOUNTER — Other Ambulatory Visit: Payer: Self-pay | Admitting: Family Medicine

## 2015-09-25 VITALS — BP 162/60 | HR 72 | Temp 98.5°F | Ht 68.0 in | Wt 228.0 lb

## 2015-09-25 DIAGNOSIS — I509 Heart failure, unspecified: Secondary | ICD-10-CM | POA: Diagnosis not present

## 2015-09-25 DIAGNOSIS — I10 Essential (primary) hypertension: Secondary | ICD-10-CM | POA: Diagnosis not present

## 2015-09-25 LAB — BASIC METABOLIC PANEL
BUN: 43 mg/dL — AB (ref 7–25)
CHLORIDE: 109 mmol/L (ref 98–110)
CO2: 24 mmol/L (ref 20–31)
Calcium: 8.6 mg/dL (ref 8.6–10.3)
Creat: 2.33 mg/dL — ABNORMAL HIGH (ref 0.70–1.25)
Glucose, Bld: 117 mg/dL — ABNORMAL HIGH (ref 65–99)
POTASSIUM: 3.9 mmol/L (ref 3.5–5.3)
SODIUM: 143 mmol/L (ref 135–146)

## 2015-09-25 MED ORDER — FLUTICASONE-SALMETEROL 250-50 MCG/DOSE IN AEPB: INHALATION_SPRAY | RESPIRATORY_TRACT | 1 refills | Status: DC

## 2015-09-25 NOTE — Patient Instructions (Signed)
Thank you for coming in today, it was so nice to see you! Today we talked about:    Increased weight: I think we need to get some fluid off of you. Please take 1 20 mgTorsemide pill 3 times a day (breakfast, lunch, and dinner)  We will need to check your kidney function today too.   Please go to the hospital if you are having chest pain or shortness of breath  Please follow up in 2 DAYS. You can schedule this appointment at the front desk before you leave or call the clinic.  Bring in all your medications or supplements to each appointment for review.   If we ordered any tests today, you will be notified via telephone of any abnormalities. If everything is normal you will get a letter in the mail.   If you have any questions or concerns, please do not hesitate to call the office at [phone]. You can also message me directly via MyChart.   Sincerely,  Smitty Cords, MD

## 2015-09-25 NOTE — Assessment & Plan Note (Addendum)
Leonard [name] has had ~ 6 CHF exacerbations requiring hospitalization in the last 6 months. He is prone to being hospitalized so we will need to work hard to keep him out of the hospital. There are signs of volume overload today; increase in weight as dry weight is 218 lbs and he is 228 lbs today. Bilateral lower extremity pitting edema on exam but otherwise normal, not concerned for CHF exacerbation at this time. I am concerned that his medications may need review again in his pill box, specifically Torsemide. Per cardiology he is supposed to take 40 mg daily (2 20 mg tablets). Looked at his pills for tomorrow and there is only 1 20 mg tablet of Torsemide in the box when there should be 2. Unsure if he hasn't been taking enough Torsemide.  - Attempted to call home health nurse Leonard [name], no answer, will try to get in contact with her again to discuss medications. Appreciate her assistance. - Will increase Torsemide to 20 mg TID for improved diuresis. Discussed with Dr. Andria Frames - Obtain BMP today - Follow up in 2 days (8/24) for weight check, BP check, and ensure he is diuresing adequately. May need to adjust Torsemide at that time.

## 2015-09-25 NOTE — Progress Notes (Signed)
Subjective:    Patient ID: Leonard [name] , male   DOB: 1950/09/23 , 65 y.o..   MRN: [id number]  HPI  Leonard [name] is here for follow up.  CHF Patient's home health nurse was concerned that he may have been having some fluid overload as his weight went up.  His Torsemide was decreased to 40 mg daily instead of 40 mg BID per cardiology on 09/11/15 .  Dry weight is about 218 # Shortness of breath- no Fluid overload- Yes, has had increased swelling in bilateral lower extremities Chest pain- No  Hypertension Blood pressure at home: 165/60 today, normally [id number] systolic at home Exercise: No  Low salt diet: sometimes adherent Medications: Compliant. His sister helps him with his medications. A home health nurse helps put medications in his pill box.  Side effects: None ROS: Denies headache, dizziness, visual changes, nausea, vomiting, chest pain, abdominal pain or shortness of breath. BP Readings from Last 3 Encounters:  09/25/15 (!) 162/60  09/14/15 (!) 143/58  09/13/15 139/73    Review of Systems: Per HPI. All other systems reviewed and are negative.  Past Medical History: Patient Active Problem List   Diagnosis Date Noted  . Acute on chronic systolic congestive heart failure ([place])   . SOB (shortness of breath) 08/28/2015  . CHF (congestive heart failure) ([place]) 08/27/2015  . Shortness of breath 08/27/2015  . Iron deficiency anemia 08/03/2015  . Claudication ([place])   . Type 2 diabetes mellitus with stage 3 chronic kidney disease, with long-term current use of insulin ([place])   . Hematuria 06/27/2015  . Knee pain   . Skin lesion of face 02/22/2015  . 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 Anemia in chronic kidney disease 01/15/2015  . CKD (chronic kidney disease) stage 4, GFR 15-29 ml/min ([facility]) 01/15/2015  . Hypothyroidism 11/22/2014  . Health care maintenance 11/22/2014  . Acute on chronic diastolic CHF (congestive heart failure) ([place])  03/16/2014  . COPD (chronic obstructive pulmonary disease) ([place]) 02/16/2014  . History of tobacco use 08/23/2013  . 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 CAD (coronary artery disease) 04/06/2013  . PVD - hx of Rt SFA PTA and s/p Rt 4-5th toe amp Dec 2014 04/05/2013  . 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 Mixed hyperlipidemia 07/17/2008    Medications: reviewed and updated Current Outpatient Prescriptions  Medication Sig Dispense Refill  . amLODipine (NORVASC) 10 MG tablet Take 1 tablet (10 mg total) by mouth daily. 30 tablet 1  . aspirin EC 81 MG EC tablet Take 1 tablet (81 mg total) by mouth daily. 30 tablet 0  . atorvastatin (LIPITOR) 40 MG tablet TAKE 1 TABLET BY MOUTH EVERY DAY AT 6 PM 30 tablet 0  . carvedilol (COREG) 25 MG tablet Take 2 tablets (50 mg total) by mouth 2 (two) times daily with a meal. 60 tablet 0  . cloNIDine (CATAPRES) 0.2 MG tablet Take 1 tablet (0.2 mg total) by mouth 2 (two) times daily. 60 tablet 1  . doxazosin (CARDURA) 2 MG tablet TAKE ONE TABLET BY MOUTH EVERY TWELVE HOURS 60 tablet 1  . Fluticasone-Salmeterol (ADVAIR DISKUS) 250-50 MCG/DOSE AEPB INHALE 1 PUFF INTO THE LUNGS 2 TIMES DAILY 60 each 1  . insulin aspart (NOVOLOG FLEXPEN) 100 UNIT/ML FlexPen Inject 35 Units into the skin 3 (three) times daily with meals. 15 mL 0  . Insulin Glargine (LANTUS SOLOSTAR) 100 UNIT/ML Solostar Pen Inject 60 Units into the skin daily.    Marland Kitchen  isosorbide mononitrate (IMDUR) 60 MG 24 hr tablet TAKE ONE (1) TABLET BY MOUTH EVERY DAY 30 tablet 3  . levothyroxine (SYNTHROID, LEVOTHROID) 50 MCG tablet Take 1 tablet (50 mcg total) by mouth daily before breakfast. 30 tablet 2  . nitroGLYCERIN (NITROSTAT) 0.4 MG SL tablet Place 1 tablet (0.4 mg total) under the tongue every 5 (five) minutes as needed for chest pain. 30 tablet 12  . pantoprazole (PROTONIX) 40 MG tablet Take 1 tablet (40 mg total) by mouth daily. 30 tablet 1  . PROVENTIL HFA 108 (90 Base) MCG/ACT inhaler INHALE 2 PUFFS INTO THE  LUNGS EVERY 6 HOURS AS NEEDED FOR WHEEZING ORSHORTNESS OF BREATH 6.7 g 3  . SURE COMFORT PEN NEEDLES 31G X 8 MM MISC USE WITH BOTH INSULIN PENS TWICE DAILY (4 PER DAY) 100 each 5  . torsemide (DEMADEX) 20 MG tablet Take 2 tablets (40 mg total) by mouth once. 120 tablet 1  . hydrALAZINE (APRESOLINE) 100 MG tablet TAKE ONE TABLET BY MOUTH EVERY EIGHT HOURS 90 tablet 3   No current facility-administered medications for this visit.     Social Hx:  reports that he quit smoking about 2 years ago. His smoking use included Cigarettes. He started smoking about 48 years ago. He has a 46.00 pack-year smoking history. He has never used smokeless tobacco.    Objective:   BP (!) 162/60   Pulse 72   Temp 98.5 F (36.9 C) (Oral)   Ht 5\' 8"  (1.727 m)   Wt 228 lb (103.4 kg)   BMI 34.67 kg/m  Physical Exam  Gen: NAD, alert, cooperative with exam, well-appearing Cardiac: Regular rate and rhythm, normal S1/S2, no murmur, 3+ pitting edema from bilateral ankles to mid shin, capillary refill brisk  Respiratory: Clear to auscultation bilaterally, no wheezes, non-labored, no crackles at the bases Gastrointestinal: Soft, non tender, non distended, bowel sounds present Skin: No rashes, normal turgor    Assessment & Plan:  CHF (congestive heart failure) [facility]) Mr. Leonard [name] has had ~ 6 CHF exacerbations requiring hospitalization in the last 6 months. He is prone to being hospitalized so we will need to work hard to keep him out of the hospital. There are signs of volume overload today; increase in weight as dry weight is 218 lbs and he is 228 lbs today. Bilateral lower extremity pitting edema on exam but otherwise normal, not concerned for CHF exacerbation at this time. I am concerned that his medications may need review again in his pill box, specifically Torsemide. Per cardiology he is supposed to take 40 mg daily (2 20 mg tablets). Looked at his pills for tomorrow and there is only 1 20 mg tablet of Torsemide in the  box when there should be 2. Unsure if he hasn't been taking enough Torsemide.  - Attempted to call home health nurse Aniceto Boss, no answer, will try to get in contact with her again to discuss medications. Appreciate her assistance. - Will increase Torsemide to 20 mg TID for improved diuresis. Discussed with Dr. Andria Frames - Obtain BMP today - Follow up in 2 days (8/24) for weight check, BP check, and ensure he is diuresing adequately. May need to adjust Torsemide at that time.  Resistant hypertension Uncontrolled. BP initially 179/64, at the end of visit it was rechecked and was 162/60.  - Continue current regimen (Amlodipine 10 mg daily, Carvedilol 50 mg BID, Clonidine 0.2 mg BID, Doxazosin 2mg  BID, Hydralazine 100 mg TID) refills provided - Suspect that increasing Torsemide to  20 mg TID(see CHF A/P) may lower his BP appropriately as he will have increased diuresis - Follow up in 2 days   Smitty Cords, MD [place], PGY-2

## 2015-09-25 NOTE — Assessment & Plan Note (Signed)
Uncontrolled. BP initially 179/64, at the end of visit it was rechecked and was 162/60.  - Continue current regimen (Amlodipine 10 mg daily, Carvedilol 50 mg BID, Clonidine 0.2 mg BID, Doxazosin 2mg  BID, Hydralazine 100 mg TID) refills provided - Suspect that increasing Torsemide to 20 mg TID(see CHF A/P) may lower his BP appropriately as he will have increased diuresis - Follow up in 2 days

## 2015-09-26 ENCOUNTER — Other Ambulatory Visit: Payer: Self-pay | Admitting: Family Medicine

## 2015-09-26 ENCOUNTER — Other Ambulatory Visit: Payer: Self-pay | Admitting: Internal Medicine

## 2015-09-27 ENCOUNTER — Ambulatory Visit (INDEPENDENT_AMBULATORY_CARE_PROVIDER_SITE_OTHER): Payer: Medicaid Other | Admitting: Family Medicine

## 2015-09-27 ENCOUNTER — Encounter: Payer: Self-pay | Admitting: Family Medicine

## 2015-09-27 ENCOUNTER — Other Ambulatory Visit (HOSPITAL_BASED_OUTPATIENT_CLINIC_OR_DEPARTMENT_OTHER): Payer: Medicaid Other

## 2015-09-27 ENCOUNTER — Ambulatory Visit (HOSPITAL_BASED_OUTPATIENT_CLINIC_OR_DEPARTMENT_OTHER): Payer: Medicaid Other

## 2015-09-27 VITALS — BP 154/64 | HR 66 | Resp 16

## 2015-09-27 VITALS — BP 159/59 | HR 67 | Temp 98.4°F | Wt 225.2 lb

## 2015-09-27 DIAGNOSIS — D631 Anemia in chronic kidney disease: Secondary | ICD-10-CM

## 2015-09-27 DIAGNOSIS — N189 Chronic kidney disease, unspecified: Secondary | ICD-10-CM

## 2015-09-27 DIAGNOSIS — I5033 Acute on chronic diastolic (congestive) heart failure: Secondary | ICD-10-CM | POA: Diagnosis not present

## 2015-09-27 DIAGNOSIS — M25562 Pain in left knee: Secondary | ICD-10-CM

## 2015-09-27 LAB — CBC & DIFF AND RETIC
BASO%: 0.3 % (ref 0.0–2.0)
Basophils Absolute: 0 10*3/uL (ref 0.0–0.1)
EOS%: 1.7 % (ref 0.0–7.0)
Eosinophils Absolute: 0.1 10*3/uL (ref 0.0–0.5)
HEMATOCRIT: 29 % — AB (ref 38.4–49.9)
HGB: 9 g/dL — ABNORMAL LOW (ref 13.0–17.1)
IMMATURE RETIC FRACT: 2 % — AB (ref 3.00–10.60)
LYMPH#: 0.9 10*3/uL (ref 0.9–3.3)
LYMPH%: 12 % — ABNORMAL LOW (ref 14.0–49.0)
MCH: 23.6 pg — ABNORMAL LOW (ref 27.2–33.4)
MCHC: 31 g/dL — AB (ref 32.0–36.0)
MCV: 75.9 fL — AB (ref 79.3–98.0)
MONO#: 0.6 10*3/uL (ref 0.1–0.9)
MONO%: 8.1 % (ref 0.0–14.0)
NEUT%: 77.9 % — ABNORMAL HIGH (ref 39.0–75.0)
NEUTROS ABS: 5.9 10*3/uL (ref 1.5–6.5)
PLATELETS: 202 10*3/uL (ref 140–400)
RBC: 3.82 10*6/uL — AB (ref 4.20–5.82)
RDW: 20.9 % — ABNORMAL HIGH (ref 11.0–14.6)
RETIC CT ABS: 61.12 10*3/uL (ref 34.80–93.90)
Retic %: 1.6 % (ref 0.80–1.80)
WBC: 7.5 10*3/uL (ref 4.0–10.3)

## 2015-09-27 MED ORDER — DARBEPOETIN ALFA 500 MCG/ML IJ SOSY
500.0000 ug | PREFILLED_SYRINGE | Freq: Once | INTRAMUSCULAR | Status: AC
  Administered 2015-09-27: 500 ug via SUBCUTANEOUS
  Filled 2015-09-27: qty 1

## 2015-09-27 NOTE — Progress Notes (Signed)
Subjective:     Patient ID: Leonard [name], male   DOB: October 03, 1950, 65 y.o.   MRN: [id number]  HPI Mr. Leonard [name] is a 65 year old male with a history of CHF, T2DM, PVD, HTN, CKD III/IV in clinic today for weight check after increasing his dose of diuretic medication.  Congestive Heart Failure Patient has a history of CHFpEF for several years and has been hospitalized recently due to exacerbations. Was seen by Dr. Juanito Doom on 8/22 and was noted to have volume overload and. After discussing his medication regimen, there was concern that he was not taking the full recommended dose of torsemide, as his weight was elevated and he had bilateral pitting edema of his legs. He was subsequently told to take the diuretic tid for 2 days and follow up in clinic for a weight recheck for progress. Denies shortness of breath, palpitations, orthopnea.  L Knee Pain Patient has had knee pain for a couple of days that has caused some difficulty walking. He believes that it is related to the fluid build up in his legs. Localized to the medial side of the knee. Per the sister, his L leg has become more swollen over the last several days despite the fluid pills. Denies warmth, decreased range of motion, numbness/tingling. Has intermittently used compression stockings/knee elevation for relief of symptoms.  Review of Systems Pertinent positives and negatives per HPI.    Objective:   Physical Exam  Constitutional: He appears well-developed and well-nourished. No distress.  Cardiovascular: Normal rate and regular rhythm.   No murmur heard. Pulmonary/Chest: Effort normal and breath sounds normal. He has no wheezes. He has no rales.  Musculoskeletal: Normal range of motion. He exhibits edema and tenderness.  Bilateral pitting edema, L 2+ R 1+. 5/5 strength in lower extremities and dorsalis pedis pulses present. Some pain with palpation of medial knee. A/P drawer's sign negative. McMurray test yielded moderate pain.    Assessment:    Mr. Leonard [name] is a 65 year old male with a history of CHF, T2DM, PVD, HTN, CKD III/IV in clinic today for weight check after increasing his dose of diuretic medication.    Plan:    CHF/Fluid Overload.  Discussed that fluid is multifactorial.  Patient has an appt with Dr Bridgett Larsson in next couple of days for vascular follow up.  Next appt with Dr Johnsie Cancel not for several months.  If continues to gain weight on BID regimen, would consider referral back to cardiologist sooner for medication management. 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  06/2015 knee xray reports reviewed.  Unsure if this is standing films.  If not, would consider b/l standing xrays. -take Tylenol arthritis as needed -will consider joint injections, imaging and ortho referral if persists [name] worsens  I have separately seen and examined the patient. I have discussed the findings and exam with Student Dr Ouida Sills and agree with the above note.  My changes/additions are outlined in BLUE.   Leonard [name] M. Lajuana Ripple, DO PGY-3, [facility]

## 2015-09-27 NOTE — Patient Instructions (Addendum)
I recommend that you take Tylenol Arthritis for your knee pain.  We can consider joint injections or referral to orthopedic doctors for your knees.   Arthritis Arthritis means joint pain. It can also mean joint disease. A joint is a place where bones come together. People who have arthritis may have:  Red joints.  Swollen joints.  Stiff joints.  Warm joints.  A fever.  A feeling of being sick. HOME CARE Pay attention to any changes in your symptoms. Take these actions to help with your pain and swelling. Medicines  Take over-the-counter and prescription medicines only as told by your doctor.  Do not take aspirin for pain if your doctor says that you may have gout. Activities  Rest your joint if your doctor tells you to.  Avoid activities that make the pain worse.  Exercise your joint regularly as told by your doctor. Try doing exercises like:  Swimming.  Water aerobics.  Biking.  Walking. Joint Care  If your joint is swollen, keep it raised (elevated) if told by your doctor.  If your joint feels stiff in the morning, try taking a warm shower.  If you have diabetes, do not apply heat without asking your doctor.  If told, apply heat to the joint:  Put a towel between the joint and the hot pack or heating pad.  Leave the heat on the area for 20-30 minutes.  If told, apply ice to the joint:  Put ice in a plastic bag.  Place a towel between your skin and the bag.  Leave the ice on for 20 minutes, 2-3 times per day.  Keep all follow-up visits as told by your doctor. GET HELP IF:  The pain gets worse.  You have a fever. GET HELP RIGHT AWAY IF:  You have very bad pain in your joint.  You have swelling in your joint.  Your joint is red.  Many joints become painful and swollen.  You have very bad back pain.  Your leg is very weak.  You cannot control your pee (urine) or poop (stool).   This information is not intended to replace advice given to  you by your health care provider. Make sure you discuss any questions you have with your health care provider.   Document Released: 04/16/2009 Document Revised: 10/11/2014 Document Reviewed: 04/17/2014 Elsevier Interactive Patient Education [employer].

## 2015-09-28 ENCOUNTER — Ambulatory Visit (HOSPITAL_COMMUNITY)
Admission: RE | Admit: 2015-09-28 | Discharge: 2015-09-28 | Disposition: A | Discharge status: Home / Self Care | Payer: Medicaid Other | Source: Ambulatory Visit | Attending: Cardiology | Admitting: Cardiology

## 2015-09-28 ENCOUNTER — Encounter (HOSPITAL_COMMUNITY): Payer: Self-pay

## 2015-09-28 VITALS — BP 160/66 | HR 71 | Wt 221.5 lb

## 2015-09-28 DIAGNOSIS — E782 Mixed hyperlipidemia: Secondary | ICD-10-CM | POA: Diagnosis present

## 2015-09-28 DIAGNOSIS — E039 Hypothyroidism, unspecified: Secondary | ICD-10-CM | POA: Insufficient documentation

## 2015-09-28 DIAGNOSIS — I119 Hypertensive heart disease without heart failure: Secondary | ICD-10-CM

## 2015-09-28 DIAGNOSIS — D472 Monoclonal gammopathy: Secondary | ICD-10-CM | POA: Diagnosis not present

## 2015-09-28 DIAGNOSIS — Z79899 Other long term (current) drug therapy: Secondary | ICD-10-CM | POA: Diagnosis not present

## 2015-09-28 DIAGNOSIS — Z794 Long term (current) use of insulin: Secondary | ICD-10-CM | POA: Insufficient documentation

## 2015-09-28 DIAGNOSIS — I130 Hypertensive heart and chronic kidney disease with heart failure and stage 1 through stage 4 chronic kidney disease, or unspecified chronic kidney disease: Secondary | ICD-10-CM | POA: Insufficient documentation

## 2015-09-28 DIAGNOSIS — Z7982 Long term (current) use of aspirin: Secondary | ICD-10-CM | POA: Insufficient documentation

## 2015-09-28 DIAGNOSIS — Z8249 Family history of ischemic heart disease and other diseases of the circulatory system: Secondary | ICD-10-CM | POA: Insufficient documentation

## 2015-09-28 DIAGNOSIS — N184 Chronic kidney disease, stage 4 (severe): Secondary | ICD-10-CM | POA: Insufficient documentation

## 2015-09-28 DIAGNOSIS — Z87891 Personal history of nicotine dependence: Secondary | ICD-10-CM | POA: Diagnosis not present

## 2015-09-28 DIAGNOSIS — J449 Chronic obstructive pulmonary disease, unspecified: Secondary | ICD-10-CM | POA: Diagnosis not present

## 2015-09-28 DIAGNOSIS — G4733 Obstructive sleep apnea (adult) (pediatric): Secondary | ICD-10-CM | POA: Diagnosis not present

## 2015-09-28 DIAGNOSIS — Z951 Presence of aortocoronary bypass graft: Secondary | ICD-10-CM | POA: Diagnosis not present

## 2015-09-28 DIAGNOSIS — I2510 Atherosclerotic heart disease of native coronary artery without angina pectoris: Secondary | ICD-10-CM | POA: Diagnosis not present

## 2015-09-28 DIAGNOSIS — I10 Essential (primary) hypertension: Secondary | ICD-10-CM

## 2015-09-28 DIAGNOSIS — R0602 Shortness of breath: Secondary | ICD-10-CM

## 2015-09-28 DIAGNOSIS — E1122 Type 2 diabetes mellitus with diabetic chronic kidney disease: Secondary | ICD-10-CM | POA: Diagnosis not present

## 2015-09-28 DIAGNOSIS — I739 Peripheral vascular disease, unspecified: Secondary | ICD-10-CM

## 2015-09-28 DIAGNOSIS — I5032 Chronic diastolic (congestive) heart failure: Secondary | ICD-10-CM | POA: Diagnosis not present

## 2015-09-28 LAB — LIPID PANEL
CHOL/HDL RATIO: 3.9 ratio
Cholesterol: 94 mg/dL (ref 0–200)
HDL: 24 mg/dL — AB (ref >40)
LDL CALC: 46 mg/dL (ref 0–99)
TRIGLYCERIDES: 121 mg/dL (ref <150)
VLDL: 24 mg/dL (ref 0–40)

## 2015-09-28 MED ORDER — CLONIDINE HCL 0.3 MG PO TABS: 0.3000 mg | ORAL_TABLET | Freq: Two times a day (BID) | ORAL | 3 refills | Status: DC

## 2015-09-28 MED ORDER — TORSEMIDE 20 MG PO TABS: ORAL_TABLET | ORAL | 3 refills | Status: DC

## 2015-09-28 NOTE — Patient Instructions (Signed)
Increase Clonidine to 0.3 mg Twice daily   Increase Torsemide to 40 mg (2 tabs) in AM and 20 mg (1 tab) in PM  Labs in 1 week  Your physician recommends that you schedule a follow-up appointment in: 2 weeks

## 2015-09-29 NOTE — Progress Notes (Signed)
PCP: Dr. Juanito Doom Cardiology: Dr. Aundra Dubin  65 yo with history of CAD s/p CABG, PAD, chronic diastolic CHF, CKD stage IV, and HTN presents for cardiology followup.  He has had multiple admissions this year for diastolic CHF.  Last echo in 5/17 showed EF 60-65% with grade II diastolic dysfunction.  He also has COPD with PFTs showing severe obstruction.  Last admission for CHF was in 7/17.  Today, he reports no dyspnea walking on flat ground.  He is short of breath with stairs/inclines.  No chest pain.  He has left knee pain that is more limiting to his ambulation than claudication, which really does not seem prominent at this time. He had been on torsemide 40 mg bid.  This was decreased to 40 mg daily, then changed to 20 mg tid when he recently saw his PCP. He has [facility] for PAD in 6/17 with plan for medical management.   Labs (8/17): K 3.9, creatinine 2.33, HCT 28.5  ECG: NSR, suspect LAE  PMH: 1. Chronic diastolic CHF: Multiple recent admissions.  Echo (5/17) with EF 60-65%, grade II diastolic dysfunction, normal RV size and systolic function.  2. PAD: Right femoral PCI in 12/14. Peripheral arterial dopplers in 6/17 with occluded right SFA.  He follows with [facility].  3. HTN: Renal artery dopplers negative in 5/17.  4. Hypothyroidism 5. CKD stage IV: Follows with Dr. Marval Regal. 6. CAD: s/p CABG x 3 in 3/15.  7. OSA: Mild, not on CPAP.  8. COPD: PFTs (3/15) with FVC 77%, FEV1 47%, ratio 59%, TLC 55% => severe obstruction.  Prior smoker.  9. 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 Hyperlipidemia 12. Anemia of renal disease  Social History   Social History  . Marital status: Single    Spouse name: N/A  . Number of children: N/A  . Years of education: N/A   Occupational History  . Not on file.   Social History Main Topics  . Smoking status: Former Smoker    Packs/day: 1.00    Years: 46.00    Types: Cigarettes    Start date: 02/04/1967    Quit date: 02/03/2013  . Smokeless tobacco: Never Used   Comment: Doing well with quitting.  . Alcohol use No  . Drug use: No  . Sexual activity: Not Currently   Other Topics Concern  . Not on file   Social History Narrative   Lives with sister, [name] in [place].  Retired - former Sports coach.   Family History  Problem Relation Age of Onset  . Cancer Mother     lung cancer  . Hypertension Mother   . Cancer Sister     patient thinks it was uterine cancer  . Hypertension Sister   . Heart attack Sister   . Stroke Neg Hx    ROS: All systems reviewed and negative except as per HPI.   Current Outpatient Prescriptions  Medication Sig Dispense Refill  . amLODipine (NORVASC) 10 MG tablet Take 1 tablet (10 mg total) by mouth daily. 30 tablet 1  . aspirin EC 81 MG EC tablet Take 1 tablet (81 mg total) by mouth daily. 30 tablet 0  . atorvastatin (LIPITOR) 40 MG tablet TAKE 1 TABLET BY MOUTH EVERY DAY AT 6 PM 30 tablet 0  . carvedilol (COREG) 25 MG tablet Take 2 tablets (50 mg total) by mouth 2 (two) times daily with a meal. 60 tablet 0  . cloNIDine (CATAPRES) 0.3 MG tablet Take 1 tablet (0.3 mg total) by  mouth 2 (two) times daily. 60 tablet 3  . doxazosin (CARDURA) 2 MG tablet TAKE ONE (1) TABLET BY MOUTH EVERY 12 HOURS 60 tablet 2  . Fluticasone-Salmeterol (ADVAIR DISKUS) 250-50 MCG/DOSE AEPB INHALE 1 PUFF INTO THE LUNGS 2 TIMES DAILY 60 each 1  . hydrALAZINE (APRESOLINE) 100 MG tablet TAKE ONE TABLET BY MOUTH EVERY EIGHT HOURS 90 tablet 3  . insulin aspart (NOVOLOG FLEXPEN) 100 UNIT/ML FlexPen Inject 35 Units into the skin 3 (three) times daily with meals. 15 mL 0  . Insulin Glargine (LANTUS SOLOSTAR) 100 UNIT/ML Solostar Pen Inject 60 Units into the skin daily.    . isosorbide mononitrate (IMDUR) 60 MG 24 hr tablet TAKE ONE (1) TABLET BY MOUTH EVERY DAY 30 tablet 3  . levothyroxine (SYNTHROID, LEVOTHROID) 50 MCG tablet TAKE ONE (1) TABLET BY MOUTH EVERY DAY BEFORE BREAKFAST 30 tablet 3  . pantoprazole (PROTONIX) 40 MG tablet Take 1  tablet (40 mg total) by mouth daily. 30 tablet 1  . PROVENTIL HFA 108 (90 Base) MCG/ACT inhaler INHALE 2 PUFFS INTO THE LUNGS EVERY 6 HOURS AS NEEDED FOR WHEEZING ORSHORTNESS OF BREATH 6.7 g 3  . SURE COMFORT PEN NEEDLES 31G X 8 MM MISC USE WITH BOTH INSULIN PENS TWICE DAILY (4 PER DAY) 100 each 5  . torsemide (DEMADEX) 20 MG tablet Take 2 tabs in AM and 1 tab in PM 90 tablet 3  . nitroGLYCERIN (NITROSTAT) 0.4 MG SL tablet Place 1 tablet (0.4 mg total) under the tongue every 5 (five) minutes as needed for chest pain. (Patient not taking: Reported on 09/28/2015) 30 tablet 12   No current facility-administered medications for this encounter.      BP (!) 160/66   Pulse 71   Wt 221 lb 8 oz (100.5 kg)   SpO2 100%   BMI 33.68 kg/m  General: NAD Neck: JVP 9-10 cm, no thyromegaly or thyroid nodule.  Lungs: Clear to auscultation bilaterally with normal respiratory effort. CV: Nondisplaced PMI.  Heart regular S1/S2, no S3/S4, no murmur.  1+ edema 1/2 up lower legs bilaterally.  No carotid bruit.  Unable to palpate pedal pulses.  Abdomen: Soft, nontender, no hepatosplenomegaly, no distention.  Skin: Intact without lesions or rashes.  Neurologic: Alert and oriented x 3.  Psych: Normal affect. Extremities: No clubbing or cyanosis. 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  HEENT: Normal.    Assessment/Plan: 1. Chronic diastolic CHF:  This is worsened by stage IV CKD.  On exam, he does appear to be volume overloaded.  NYHA class II-III symptoms.  Multiple admissions this year.  - Increase torsemide to 40 qam/20 qpm.  BMET in 1 week.  2. HTN: BP remains high despite taking all his meds.  Increase clonidine to 0.3 mg bid.  He is on maximal Coreg, hydralazine, and amlodipine.  Can increase doxazosin in the future if neeeded.  3. CAD: S/p CABG.  No chest pain.  Continue ASA 91 and statin.  Check lipids today.  4. COPD: Severe by 3/15 PFTs.  He has quit smoking.  5. CKD: Stage IV.  Sees Dr Marval Regal.   This significantly complicates CHF treatment.   6. PAD: Not particularly limited by claudication now.  Saw [facility] 6/17, has followup.   Loralie Champagne 09/29/2015

## 2015-10-03 ENCOUNTER — Other Ambulatory Visit (HOSPITAL_COMMUNITY): Payer: Self-pay

## 2015-10-03 ENCOUNTER — Telehealth (HOSPITAL_COMMUNITY): Payer: Self-pay

## 2015-10-03 NOTE — Telephone Encounter (Signed)
Reeves Forth RN with [facility] called to verify medication changes since patients last OV with CHF clinic. Verified Lasix and HCTZ were no longer active on patient's medication list, and torsemide was increased to 40 mg in am and 20 mg in pm, and clonidin was increased to 0.3 mg twice daily. Nurse reports patient doing well and stable, weight 228 lbs (same as last week), BP 150/78, HR 62, 98% on RA.  No edema or SOB noted.  Patient has no questions, complaints, or issues at this time.  Renee Pain, RN

## 2015-10-05 ENCOUNTER — Ambulatory Visit (INDEPENDENT_AMBULATORY_CARE_PROVIDER_SITE_OTHER): Payer: Medicaid Other | Admitting: Family Medicine

## 2015-10-05 ENCOUNTER — Encounter: Payer: Self-pay | Admitting: Family Medicine

## 2015-10-05 DIAGNOSIS — I5032 Chronic diastolic (congestive) heart failure: Secondary | ICD-10-CM | POA: Diagnosis not present

## 2015-10-05 NOTE — Progress Notes (Signed)
Leonard [name]:    Leonard [name]: Leonard [name] , male   DOB: 07-01-1950 , 65 y.o..   MRN: [id number]  HPI  Leonard [name] is here for Follow-up.  Congestive Heart Failure He saw his cardiologist Dr. Aundra Dubin on August 25 Dr. Aundra Dubin adjusted torsemide to 40 mg in the morning and 20 at night Additionally his clonidine was increased to 0.3 mg twice a day 2 improve hypertension Denies any dyspnea chest pain or palpitations Admits to swelling in his legs, no worse than normal Is continuing to urinate appropriately Has not been eating or drinking any more than normal He has gained 3 pounds in the last week Leonard's sister is very involved in his care and ensures he takes his medicine   Review of Systems: Per HPI. All other systems reviewed and are negative.  Past Medical History: Leonard Active Problem List   Diagnosis Date Noted  . Acute on chronic systolic congestive heart failure ([place])   . SOB (shortness of breath) 08/28/2015  . CHF (congestive heart failure) ([place]) 08/27/2015  . Shortness of breath 08/27/2015  . Iron deficiency anemia 08/03/2015  . Claudication ([place])   . Type 2 diabetes mellitus with stage 3 chronic kidney disease, with long-term current use of insulin ([place])   . Hematuria 06/27/2015  . Knee pain   . Skin lesion of face 02/22/2015  . 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 Anemia in chronic kidney disease 01/15/2015  . CKD (chronic kidney disease) stage 4, GFR 15-29 ml/min (HCC) 01/15/2015  . Hypothyroidism 11/22/2014  . Health care maintenance 11/22/2014  . Acute on chronic diastolic CHF (congestive heart failure) ([place]) 03/16/2014  . COPD (chronic obstructive pulmonary disease) ([place]) 02/16/2014  . History of tobacco use 08/23/2013  . 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 CAD (coronary artery disease) 04/06/2013  . PVD - hx of Rt SFA PTA and s/p Rt 4-5th toe amp Dec 2014 04/05/2013  . 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 Mixed hyperlipidemia  07/17/2008    Medications: reviewed and updated Current Outpatient Prescriptions  Medication Sig Dispense Refill  . amLODipine (NORVASC) 10 MG tablet Take 1 tablet (10 mg total) by mouth daily. 30 tablet 1  . aspirin EC 81 MG EC tablet Take 1 tablet (81 mg total) by mouth daily. 30 tablet 0  . atorvastatin (LIPITOR) 40 MG tablet TAKE 1 TABLET BY MOUTH EVERY DAY AT 6 PM 30 tablet 0  . carvedilol (COREG) 25 MG tablet Take 2 tablets (50 mg total) by mouth 2 (two) times daily with a meal. 60 tablet 0  . cloNIDine (CATAPRES) 0.3 MG tablet Take 1 tablet (0.3 mg total) by mouth 2 (two) times daily. 60 tablet 3  . doxazosin (CARDURA) 2 MG tablet TAKE ONE (1) TABLET BY MOUTH EVERY 12 HOURS 60 tablet 2  . Fluticasone-Salmeterol (ADVAIR DISKUS) 250-50 MCG/DOSE AEPB INHALE 1 PUFF INTO THE LUNGS 2 TIMES DAILY 60 each 1  . hydrALAZINE (APRESOLINE) 100 MG tablet TAKE ONE TABLET BY MOUTH EVERY EIGHT HOURS 90 tablet 3  . insulin aspart (NOVOLOG FLEXPEN) 100 UNIT/ML FlexPen Inject 35 Units into the skin 3 (three) times daily with meals. 15 mL 0  . Insulin Glargine (LANTUS SOLOSTAR) 100 UNIT/ML Solostar Pen Inject 60 Units into the skin daily.    . isosorbide mononitrate (IMDUR) 60 MG 24 hr tablet TAKE ONE (1) TABLET BY MOUTH EVERY DAY 30 tablet 3  . levothyroxine (SYNTHROID, LEVOTHROID) 50 MCG tablet TAKE ONE (1) TABLET  BY MOUTH EVERY DAY BEFORE BREAKFAST 30 tablet 3  . nitroGLYCERIN (NITROSTAT) 0.4 MG SL tablet Place 1 tablet (0.4 mg total) under the tongue every 5 (five) minutes as needed for chest pain. (Leonard not taking: Reported on 09/28/2015) 30 tablet 12  . pantoprazole (PROTONIX) 40 MG tablet Take 1 tablet (40 mg total) by mouth daily. 30 tablet 1  . PROVENTIL HFA 108 (90 Base) MCG/ACT inhaler INHALE 2 PUFFS INTO THE LUNGS EVERY 6 HOURS AS NEEDED FOR WHEEZING ORSHORTNESS OF BREATH 6.7 g 3  . SURE COMFORT PEN NEEDLES 31G X 8 MM MISC USE WITH BOTH INSULIN PENS TWICE DAILY (4 PER DAY) 100 each 5  .  torsemide (DEMADEX) 20 MG tablet Take 2 tabs in AM and 1 tab in PM 90 tablet 3   No current facility-administered medications for this visit.     Social Hx:  reports that he quit smoking about 2 years ago. His smoking use included Cigarettes. He started smoking about 48 years ago. He has a 46.00 pack-year smoking history. He has never used smokeless tobacco.    Objective:   BP (!) 150/66   Pulse 60   Temp 97.9 F (36.6 C) (Oral)   Ht 5\' 8"  (1.727 m)   Wt 224 lb 12.8 oz (102 kg)   BMI 34.18 kg/m  Physical Exam  Gen: NAD, alert, cooperative with exam, well-appearing Cardiac: Regular rate and rhythm, normal S1/S2, no murmur, 3+ pitting edema in bilateral ankles, capillary refill brisk  Respiratory: Clear to auscultation bilaterally, no wheezes, non-labored Gastrointestinal: soft, non tender, non distended, bowel sounds present Skin: no rashes, normal turgor  Neurological: no gross deficits.  Psych: good insight, normal mood and affect   Assessment & Plan:  CHF (congestive heart failure) (HCC) Continued fluid overload, likely multifactorial. He is up 6 pounds from his dry weight (dry weight 218 pounds he is 224 pounds today). Continues to have 3+ pitting edema bilaterally in both ankles. No signs of CHF exacerbation at this time. Current torsemide regimen is 40 mg in the morning and 20 mg at night per cardiology. I consolidated multiple pill bottles to provide less confusion. Again pillbox has incorrect amount of torsemide with 40 mg in the morning and 40 mg at night.- Will leave medication as is and Leonard's pillbox with 40 mg in the morning and 40 mg at night in an attempt to continue diuresis -Follow-up in 5 days on September 6, will reevaluate fluid status at that time -If Leonard's weight is down in fluid status improves we will adjust torsemide to 40 mg in the morning and 20 mg at night - Would like to touch base with home health company to ensure Leonard's medications are being  correctly filled in [place], MD [place], PGY-2

## 2015-10-05 NOTE — Assessment & Plan Note (Addendum)
Continued fluid overload, likely multifactorial. He is up 6 pounds from his dry weight (dry weight 218 pounds he is 224 pounds today). Continues to have 3+ pitting edema bilaterally in both ankles. No signs of CHF exacerbation at this time. Current torsemide regimen is 40 mg in the morning and 20 mg at night per cardiology. I consolidated multiple pill bottles to provide less confusion. Again pillbox has incorrect amount of torsemide with 40 mg in the morning and 40 mg at night.- Will leave medication as is and patient's pillbox with 40 mg in the morning and 40 mg at night in an attempt to continue diuresis -Follow-up in 5 days on September 6, will reevaluate fluid status at that time -If patient's weight is down in fluid status improves we will adjust torsemide to 40 mg in the morning and 20 mg at night - Would like to touch base with home health company to ensure patient's medications are being correctly filled in pillbox

## 2015-10-05 NOTE — Patient Instructions (Signed)
Thank you for coming in today, it was so nice to see you! Today we talked about:    Heart failure: Continue to take 2 pills of Torsemide in the morning and 2 at night until I see you on the 6th. Please go to the hospital if you have any shortness of breath or chest pain.  . Bring in all your medications or supplements to each appointment for review.   If you have any questions or concerns, please do not hesitate to call the office at [phone]. You can also message me directly via MyChart.   Sincerely,  Smitty Cords, MD

## 2015-10-10 ENCOUNTER — Encounter: Payer: Self-pay | Admitting: Family Medicine

## 2015-10-10 ENCOUNTER — Ambulatory Visit (INDEPENDENT_AMBULATORY_CARE_PROVIDER_SITE_OTHER): Payer: Medicaid Other | Admitting: Family Medicine

## 2015-10-10 DIAGNOSIS — I5032 Chronic diastolic (congestive) heart failure: Secondary | ICD-10-CM | POA: Diagnosis not present

## 2015-10-10 NOTE — Patient Instructions (Addendum)
Thank you for coming in today, it was so nice to see you! Today we talked about:    Weight: Your weight looks good. Please resume 2 pills of torsemide in the morning (40 mg total) and 1 at night (20 mg total)  Please go to the hospital if you have shortness of breath or chest pain  Please follow up in 4-8 weeks. You can schedule this appointment at the front desk before you leave or call the clinic.  Bring in all your medications or supplements to each appointment for review.   If we ordered any tests today, you will be notified via telephone of any abnormalities. If everything is normal you will get a letter in the mail.   If you have any questions or concerns, please do not hesitate to call the office at [phone]. You can also message me directly via MyChart.   Sincerely,  Smitty Cords, MD

## 2015-10-10 NOTE — Assessment & Plan Note (Addendum)
Continues to have bilateral ankle pitting edema which is stable from previous exam. Weight is 221 today, he is down 3 pounds since 5 days ago. Closer to dry weight which is approximately 218 pounds. No signs of CHF exacerbation at this time. Pill box contains correct amount of torsemide. -Resume 40 mg of torsemide in the morning and 20 mg at night -Follow-up with cardiologist on 10/15/2015 -Follow-up with me in 2 months or sooner if needed

## 2015-10-10 NOTE — Progress Notes (Signed)
Subjective:    Patient ID: Leonard [name] , male   DOB: 1950-10-18 , 65 y.o..   MRN: [id number]  HPI  Leonard [name] is here for follow up.  Congestive Heart failure Was last seen by me on September 1.  On September 1 we discussed continuing 40 mg torsemide in the morning and 20 at night because his weight was up Since being seen by me on September 1 he continues to have edema Denies any dyspnea chest pain or palpitations Admits to improved swelling in his legs Is continuing to urinate appropriately Has not been eating or drinking any more than normal He has lost 3 pounds in the last week Patient's sister helps him with his medications, he is here today alone but he has brought all his medications with him including his pillbox   Review of Systems: Per HPI. All other systems reviewed and are negative.  Past Medical History: Patient Active Problem List   Diagnosis Date Noted  . Acute on chronic systolic congestive heart failure ([place])   . SOB (shortness of breath) 08/28/2015  . CHF (congestive heart failure) ([place]) 08/27/2015  . Shortness of breath 08/27/2015  . Iron deficiency anemia 08/03/2015  . Claudication ([place])   . Type 2 diabetes mellitus with stage 3 chronic kidney disease, with long-term current use of insulin ([place])   . Hematuria 06/27/2015  . Knee pain   . Skin lesion of face 02/22/2015  . 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 Anemia in chronic kidney disease 01/15/2015  . CKD (chronic kidney disease) stage 4, GFR 15-29 ml/min (HCC) 01/15/2015  . Hypothyroidism 11/22/2014  . Health care maintenance 11/22/2014  . Acute on chronic diastolic CHF (congestive heart failure) ([place]) 03/16/2014  . COPD (chronic obstructive pulmonary disease) ([place]) 02/16/2014  . History of tobacco use 08/23/2013  . 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 CAD (coronary artery disease) 04/06/2013  . PVD - hx of Rt SFA PTA and s/p Rt 4-5th toe amp Dec 2014 04/05/2013  .  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 Mixed hyperlipidemia 07/17/2008    Medications: reviewed and updated Current Outpatient Prescriptions  Medication Sig Dispense Refill  . amLODipine (NORVASC) 10 MG tablet Take 1 tablet (10 mg total) by mouth daily. 30 tablet 1  . aspirin EC 81 MG EC tablet Take 1 tablet (81 mg total) by mouth daily. 30 tablet 0  . atorvastatin (LIPITOR) 40 MG tablet TAKE 1 TABLET BY MOUTH EVERY DAY AT 6 PM 30 tablet 0  . carvedilol (COREG) 25 MG tablet Take 2 tablets (50 mg total) by mouth 2 (two) times daily with a meal. 60 tablet 0  . cloNIDine (CATAPRES) 0.3 MG tablet Take 1 tablet (0.3 mg total) by mouth 2 (two) times daily. 60 tablet 3  . doxazosin (CARDURA) 2 MG tablet TAKE ONE (1) TABLET BY MOUTH EVERY 12 HOURS 60 tablet 2  . Fluticasone-Salmeterol (ADVAIR DISKUS) 250-50 MCG/DOSE AEPB INHALE 1 PUFF INTO THE LUNGS 2 TIMES DAILY 60 each 1  . hydrALAZINE (APRESOLINE) 100 MG tablet TAKE ONE TABLET BY MOUTH EVERY EIGHT HOURS 90 tablet 3  . insulin aspart (NOVOLOG FLEXPEN) 100 UNIT/ML FlexPen Inject 35 Units into the skin 3 (three) times daily with meals. 15 mL 0  . Insulin Glargine (LANTUS SOLOSTAR) 100 UNIT/ML Solostar Pen Inject 60 Units into the skin daily.    . isosorbide mononitrate (IMDUR) 60 MG 24 hr tablet TAKE ONE (1) TABLET BY MOUTH EVERY DAY 30  tablet 3  . levothyroxine (SYNTHROID, LEVOTHROID) 50 MCG tablet TAKE ONE (1) TABLET BY MOUTH EVERY DAY BEFORE BREAKFAST 30 tablet 3  . nitroGLYCERIN (NITROSTAT) 0.4 MG SL tablet Place 1 tablet (0.4 mg total) under the tongue every 5 (five) minutes as needed for chest pain. (Patient not taking: Reported on 09/28/2015) 30 tablet 12  . pantoprazole (PROTONIX) 40 MG tablet Take 1 tablet (40 mg total) by mouth daily. 30 tablet 1  . PROVENTIL HFA 108 (90 Base) MCG/ACT inhaler INHALE 2 PUFFS INTO THE LUNGS EVERY 6 HOURS AS NEEDED FOR WHEEZING ORSHORTNESS OF BREATH 6.7 g 3  . SURE COMFORT PEN NEEDLES 31G X 8 MM MISC USE WITH BOTH  INSULIN PENS TWICE DAILY (4 PER DAY) 100 each 5  . torsemide (DEMADEX) 20 MG tablet Take 2 tabs in AM and 1 tab in PM 90 tablet 3   No current facility-administered medications for this visit.     Social Hx:  reports that he quit smoking about 2 years ago. His smoking use included Cigarettes. He started smoking about 48 years ago. He has a 46.00 pack-year smoking history. He has never used smokeless tobacco.    Objective:   BP (!) 152/67 (BP Location: Left Arm, Patient Position: Sitting, Cuff Size: Normal)   Pulse 70   Temp 98.2 F (36.8 C) (Oral)   Ht 5\' 8"  (1.727 m)   Wt 221 lb (100.2 kg)   BMI 33.60 kg/m  Physical Exam  Gen: NAD, alert, cooperative with exam, well-appearing Cardiac: Regular rate and rhythm, normal S1/S2, no murmur, no edema, capillary refill brisk. 3+ pitting edema in ankles bilaterally extending to mid calf Respiratory: Clear to auscultation bilaterally, no wheezes, non-labored breathing Gastrointestinal: soft, non tender, non distended, bowel sounds present Skin: no rashes, normal turgor  Neurological: no gross deficits.  Psych: good insight, normal mood and affect   Assessment & Plan:  CHF (congestive heart failure) (HCC) Continues to have bilateral ankle pitting edema which is stable from previous exam. Weight is 221 today, he is down 3 pounds since 5 days ago. Closer to dry weight which is approximately 218 pounds. No signs of CHF exacerbation at this time. Pill box contains correct amount of torsemide. -Resume 40 mg of torsemide in the morning and 20 mg at night -Follow-up with cardiologist on 10/15/2015 -Follow-up with me in 2 months or sooner if needed   Smitty Cords, MD [place], PGY-2

## 2015-10-11 ENCOUNTER — Ambulatory Visit (HOSPITAL_BASED_OUTPATIENT_CLINIC_OR_DEPARTMENT_OTHER): Payer: Medicaid Other

## 2015-10-11 ENCOUNTER — Other Ambulatory Visit (HOSPITAL_BASED_OUTPATIENT_CLINIC_OR_DEPARTMENT_OTHER): Payer: Medicaid Other

## 2015-10-11 VITALS — BP 138/64 | HR 69 | Temp 98.4°F | Resp 18

## 2015-10-11 DIAGNOSIS — D631 Anemia in chronic kidney disease: Secondary | ICD-10-CM | POA: Diagnosis not present

## 2015-10-11 DIAGNOSIS — N189 Chronic kidney disease, unspecified: Secondary | ICD-10-CM

## 2015-10-11 LAB — CBC & DIFF AND RETIC
BASO%: 0.6 % (ref 0.0–2.0)
BASOS ABS: 0 10*3/uL (ref 0.0–0.1)
EOS ABS: 0.3 10*3/uL (ref 0.0–0.5)
EOS%: 4.1 % (ref 0.0–7.0)
HCT: 29.8 % — ABNORMAL LOW (ref 38.4–49.9)
HEMOGLOBIN: 9.2 g/dL — AB (ref 13.0–17.1)
IMMATURE RETIC FRACT: 9.7 % (ref 3.00–10.60)
LYMPH#: 1.1 10*3/uL (ref 0.9–3.3)
LYMPH%: 15.6 % (ref 14.0–49.0)
MCH: 23.4 pg — ABNORMAL LOW (ref 27.2–33.4)
MCHC: 30.9 g/dL — ABNORMAL LOW (ref 32.0–36.0)
MCV: 75.6 fL — ABNORMAL LOW (ref 79.3–98.0)
MONO#: 0.6 10*3/uL (ref 0.1–0.9)
MONO%: 9 % (ref 0.0–14.0)
NEUT#: 4.9 10*3/uL (ref 1.5–6.5)
NEUT%: 70.7 % (ref 39.0–75.0)
NRBC: 0 % (ref 0–0)
Platelets: 168 10*3/uL (ref 140–400)
RBC: 3.94 10*6/uL — ABNORMAL LOW (ref 4.20–5.82)
RDW: 20.4 % — AB (ref 11.0–14.6)
RETIC %: 1.52 % (ref 0.80–1.80)
Retic Ct Abs: 59.89 10*3/uL (ref 34.80–93.90)
WBC: 6.9 10*3/uL (ref 4.0–10.3)

## 2015-10-11 MED ORDER — DARBEPOETIN ALFA 500 MCG/ML IJ SOSY
500.0000 ug | PREFILLED_SYRINGE | Freq: Once | INTRAMUSCULAR | Status: AC
  Administered 2015-10-11: 500 ug via SUBCUTANEOUS
  Filled 2015-10-11: qty 1

## 2015-10-11 NOTE — Patient Instructions (Signed)
Darbepoetin Alfa injection What is this medicine? DARBEPOETIN ALFA (dar be POE e tin AL fa) helps your body make more red blood cells. It is used to treat anemia caused by chronic kidney failure and chemotherapy. This medicine may be used for other purposes; ask your health care provider or pharmacist if you have questions. What should I tell my health care provider before I take this medicine? They need to know if you have any of these conditions: -blood clotting disorders or history of blood clots -cancer patient not on chemotherapy -cystic fibrosis -heart disease, such as angina, heart failure, or a history of a heart attack -hemoglobin level of 12 g/dL or greater -high blood pressure -low levels of folate, iron, or vitamin B12 -seizures -an unusual or allergic reaction to darbepoetin, erythropoietin, albumin, hamster proteins, latex, other medicines, foods, dyes, or preservatives -pregnant or trying to get pregnant -breast-feeding How should I use this medicine? This medicine is for injection into a vein or under the skin. It is usually given by a health care professional in a hospital or clinic setting. If you get this medicine at home, you will be taught how to prepare and give this medicine. Do not shake the solution before you withdraw a dose. Use exactly as directed. Take your medicine at regular intervals. Do not take your medicine more often than directed. It is important that you put your used needles and syringes in a special sharps container. Do not put them in a trash can. If you do not have a sharps container, call your pharmacist or healthcare provider to get one. Talk to your pediatrician regarding the use of this medicine in children. While this medicine may be used in children as young as 1 year for selected conditions, precautions do apply. Overdosage: If you think you have taken too much of this medicine contact a poison control center or emergency room at once. NOTE:  This medicine is only for you. Do not share this medicine with others. What if I miss a dose? If you miss a dose, take it as soon as you can. If it is almost time for your next dose, take only that dose. Do not take double or extra doses. What may interact with this medicine? Do not take this medicine with any of the following medications: -epoetin alfa This list may not describe all possible interactions. Give your health care provider a list of all the medicines, herbs, non-prescription drugs, or dietary supplements you use. Also tell them if you smoke, drink alcohol, or use illegal drugs. Some items may interact with your medicine. What should I watch for while using this medicine? Visit your prescriber or health care professional for regular checks on your progress and for the needed blood tests and blood pressure measurements. It is especially important for the doctor to make sure your hemoglobin level is in the desired range, to limit the risk of potential side effects and to give you the best benefit. Keep all appointments for any recommended tests. Check your blood pressure as directed. Ask your doctor what your blood pressure should be and when you should contact him or her. As your body makes more red blood cells, you may need to take iron, folic acid, or vitamin B supplements. Ask your doctor or health care provider which products are right for you. If you have kidney disease continue dietary restrictions, even though this medication can make you feel better. Talk with your doctor or health care professional about the   foods you eat and the vitamins that you take. What side effects may I notice from receiving this medicine? Side effects that you should report to your doctor or health care professional as soon as possible: -allergic reactions like skin rash, itching or hives, swelling of the face, lips, or tongue -breathing problems -changes in vision -chest pain -confusion, trouble speaking  or understanding -feeling faint or lightheaded, falls -high blood pressure -muscle aches or pains -pain, swelling, warmth in the leg -rapid weight gain -severe headaches -sudden numbness or weakness of the face, arm or leg -trouble walking, dizziness, loss of balance or coordination -seizures (convulsions) -swelling of the ankles, feet, hands -unusually weak or tired Side effects that usually do not require medical attention (report to your doctor or health care professional if they continue or are bothersome): -diarrhea -fever, chills (flu-like symptoms) -headaches -nausea, vomiting -redness, stinging, or swelling at site where injected This list may not describe all possible side effects. Call your doctor for medical advice about side effects. You may report side effects to FDA at 1-800-FDA-1088. Where should I keep my medicine? Keep out of the reach of children. Store in a refrigerator between 2 and 8 degrees C (36 and 46 degrees F). Do not freeze. Do not shake. Throw away any unused portion if using a single-dose vial. Throw away any unused medicine after the expiration date. NOTE: This sheet is a summary. It may not cover all possible information. If you have questions about this medicine, talk to your doctor, pharmacist, or health care provider.    2016, [employer]. (2008-01-04 10:23:57)  

## 2015-10-12 ENCOUNTER — Other Ambulatory Visit (HOSPITAL_COMMUNITY): Payer: Medicaid Other

## 2015-10-12 ENCOUNTER — Encounter: Payer: Self-pay | Admitting: Podiatry

## 2015-10-12 ENCOUNTER — Ambulatory Visit (INDEPENDENT_AMBULATORY_CARE_PROVIDER_SITE_OTHER): Payer: Medicaid Other | Admitting: Podiatry

## 2015-10-12 DIAGNOSIS — M79676 Pain in unspecified toe(s): Secondary | ICD-10-CM

## 2015-10-12 DIAGNOSIS — B351 Tinea unguium: Secondary | ICD-10-CM | POA: Diagnosis not present

## 2015-10-12 DIAGNOSIS — E1140 Type 2 diabetes mellitus with diabetic neuropathy, unspecified: Secondary | ICD-10-CM | POA: Diagnosis not present

## 2015-10-12 DIAGNOSIS — Q828 Other specified congenital malformations of skin: Secondary | ICD-10-CM

## 2015-10-12 NOTE — Progress Notes (Signed)
Patient ID: ADARIAN BUR, male   DOB: 24-Oct-1950, 65 y.o.   MRN: [id number] Complaint:  Visit Type: Patient returns to my office for continued preventative foot care services. Complaint: Patient states" my nails have grown long and thick and become painful to walk and wear shoes" Patient has been diagnosed with DM with amputation 4,5 rays right foot... The patient presents for preventative foot care services. No changes to ROS  Podiatric Exam: Vascular: dorsalis pedis and posterior tibial pulses are not  palpable bilateral. Capillary return is immediate.  Cold feet Absence of hair.  Sensorium: Absent  Semmes Weinstein monofilament test. Normal tactile sensation bilaterally. Nail Exam: Pt has thick disfigured discolored nails with subungual debris noted bilateral entire nail hallux through fifth toenails Ulcer Exam: There is no evidence of ulcer or pre-ulcerative changes or infection. Orthopedic Exam: Muscle tone and strength are WNL. No limitations in general ROM. No crepitus or effusions noted. Foot type and digits show no abnormalities. Bony prominences are unremarkable. Skin: Asymptomatic  Porokeratosis left foot.. No infection or ulcers  Diagnosis:  Onychomycosis, , Pain in right toe, pain in left toes  Treatment & Plan Procedures and Treatment: Consent by patient was obtained for treatment procedures. The patient understood the discussion of treatment and procedures well. All questions were answered thoroughly reviewed. Debridement of mycotic and hypertrophic toenails, 1 through 5 bilateral and clearing of subungual debris. No ulceration, no infection noted. Requests no drill usage.  Patient has self-avulsed third toenail right foot which is healing uneventfully. Return Visit-Office Procedure: Patient instructed to return to the office for a follow up visit 3 months for continued evaluation and treatment.    Gardiner Barefoot DPM

## 2015-10-15 ENCOUNTER — Ambulatory Visit (HOSPITAL_COMMUNITY)
Admission: RE | Admit: 2015-10-15 | Discharge: 2015-10-15 | Disposition: A | Discharge status: Home / Self Care | Payer: Medicaid Other | Source: Ambulatory Visit | Attending: Cardiology | Admitting: Cardiology

## 2015-10-15 VITALS — BP 190/80 | HR 77 | Wt 228.0 lb

## 2015-10-15 DIAGNOSIS — Z79899 Other long term (current) drug therapy: Secondary | ICD-10-CM | POA: Insufficient documentation

## 2015-10-15 DIAGNOSIS — J449 Chronic obstructive pulmonary disease, unspecified: Secondary | ICD-10-CM | POA: Diagnosis not present

## 2015-10-15 DIAGNOSIS — E039 Hypothyroidism, unspecified: Secondary | ICD-10-CM | POA: Diagnosis not present

## 2015-10-15 DIAGNOSIS — I5032 Chronic diastolic (congestive) heart failure: Secondary | ICD-10-CM | POA: Diagnosis present

## 2015-10-15 DIAGNOSIS — I10 Essential (primary) hypertension: Secondary | ICD-10-CM | POA: Diagnosis not present

## 2015-10-15 DIAGNOSIS — Z87891 Personal history of nicotine dependence: Secondary | ICD-10-CM | POA: Diagnosis not present

## 2015-10-15 DIAGNOSIS — E1151 Type 2 diabetes mellitus with diabetic peripheral angiopathy without gangrene: Secondary | ICD-10-CM | POA: Insufficient documentation

## 2015-10-15 DIAGNOSIS — R0602 Shortness of breath: Secondary | ICD-10-CM

## 2015-10-15 DIAGNOSIS — I2510 Atherosclerotic heart disease of native coronary artery without angina pectoris: Secondary | ICD-10-CM | POA: Diagnosis not present

## 2015-10-15 DIAGNOSIS — Z794 Long term (current) use of insulin: Secondary | ICD-10-CM | POA: Diagnosis not present

## 2015-10-15 DIAGNOSIS — E1122 Type 2 diabetes mellitus with diabetic chronic kidney disease: Secondary | ICD-10-CM | POA: Diagnosis not present

## 2015-10-15 DIAGNOSIS — I5023 Acute on chronic systolic (congestive) heart failure: Secondary | ICD-10-CM

## 2015-10-15 DIAGNOSIS — Z951 Presence of aortocoronary bypass graft: Secondary | ICD-10-CM | POA: Diagnosis not present

## 2015-10-15 DIAGNOSIS — Z7982 Long term (current) use of aspirin: Secondary | ICD-10-CM | POA: Diagnosis not present

## 2015-10-15 DIAGNOSIS — I130 Hypertensive heart and chronic kidney disease with heart failure and stage 1 through stage 4 chronic kidney disease, or unspecified chronic kidney disease: Secondary | ICD-10-CM | POA: Diagnosis not present

## 2015-10-15 DIAGNOSIS — N184 Chronic kidney disease, stage 4 (severe): Secondary | ICD-10-CM | POA: Diagnosis not present

## 2015-10-15 LAB — BASIC METABOLIC PANEL
ANION GAP: 12 (ref 5–15)
BUN: 39 mg/dL — AB (ref 6–20)
CALCIUM: 8.4 mg/dL — AB (ref 8.9–10.3)
CO2: 23 mmol/L (ref 22–32)
Chloride: 103 mmol/L (ref 101–111)
Creatinine, Ser: 2.28 mg/dL — ABNORMAL HIGH (ref 0.61–1.24)
GFR calc Af Amer: 33 mL/min — ABNORMAL LOW (ref >60)
GFR, EST NON AFRICAN AMERICAN: 29 mL/min — AB (ref >60)
GLUCOSE: 339 mg/dL — AB (ref 65–99)
Potassium: 3.4 mmol/L — ABNORMAL LOW (ref 3.5–5.1)
SODIUM: 138 mmol/L (ref 135–145)

## 2015-10-15 LAB — BRAIN NATRIURETIC PEPTIDE: B NATRIURETIC PEPTIDE 5: 265.1 pg/mL — AB (ref 0.0–100.0)

## 2015-10-15 MED ORDER — TORSEMIDE 20 MG PO TABS: 40.0000 mg | ORAL_TABLET | Freq: Two times a day (BID) | ORAL | 3 refills | Status: DC

## 2015-10-15 MED ORDER — DOXAZOSIN MESYLATE 4 MG PO TABS: 4.0000 mg | ORAL_TABLET | Freq: Two times a day (BID) | ORAL | 6 refills | Status: DC

## 2015-10-15 NOTE — Patient Instructions (Signed)
Increase Cardura to 4 mg Twice daily   Increase Torsemide to 40 mg (2 tab) Twice daily   Labs today  Your physician recommends that you schedule a follow-up appointment in: 1 week

## 2015-10-15 NOTE — Progress Notes (Signed)
PCP: Dr. Juanito Doom Cardiology: Dr. Aundra Dubin  65 yo with history of CAD s/p CABG, PAD, chronic diastolic CHF, CKD stage IV, and HTN presents for cardiology followup.  He has had multiple admissions this year for diastolic CHF.  Last echo in 5/17 showed EF 60-65% with grade II diastolic dysfunction.  He also has COPD with PFTs showing severe obstruction.  Last admission for CHF was in 7/17.    Today he returns for HF follow up. Overall feeling ok. SOB with steps. Denies PND/Orthopnea. Weight at home 219-220 pounds. Drinks lots fluid. Missed last nights medications. SBP at home 160-170s.  [facility] following with tele-monitoring.   Labs (8/17): K 3.9, creatinine 2.33, HCT 28.5  ECG: NSR, suspect LAE  PMH: 1. Chronic diastolic CHF: Multiple recent admissions.  Echo (5/17) with EF 60-65%, grade II diastolic dysfunction, normal RV size and systolic function.  2. PAD: Right femoral PCI in 12/14. Peripheral arterial dopplers in 6/17 with occluded right SFA.  He follows with [facility].  3. HTN: Renal artery dopplers negative in 5/17.  4. Hypothyroidism 5. CKD stage IV: Follows with Dr. Marval Regal. 6. CAD: s/p CABG x 3 in 3/15.  7. OSA: Mild, not on CPAP.  8. COPD: PFTs (3/15) with FVC 77%, FEV1 47%, ratio 59%, TLC 55% => severe obstruction.  Prior smoker.  9. 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 Hyperlipidemia 12. Anemia of renal disease  Social History   Social History  . Marital status: Single    Spouse name: N/A  . Number of children: N/A  . Years of education: N/A   Occupational History  . Not on file.   Social History Main Topics  . Smoking status: Former Smoker    Packs/day: 1.00    Years: 46.00    Types: Cigarettes    Start date: 02/04/1967    Quit date: 02/03/2013  . Smokeless tobacco: Never Used     Comment: Doing well with quitting.  . Alcohol use No  . Drug use: No  . Sexual activity: Not Currently   Other Topics Concern  . Not on file   Social History Narrative   Lives with sister, [name]  in [place].  Retired - former Sports coach.   Family History  Problem Relation Age of Onset  . Cancer Mother     lung cancer  . Hypertension Mother   . Cancer Sister     patient thinks it was uterine cancer  . Hypertension Sister   . Heart attack Sister   . Stroke Neg Hx    ROS: All systems reviewed and negative except as per HPI.   Current Outpatient Prescriptions  Medication Sig Dispense Refill  . amLODipine (NORVASC) 10 MG tablet Take 1 tablet (10 mg total) by mouth daily. 30 tablet 1  . aspirin EC 81 MG EC tablet Take 1 tablet (81 mg total) by mouth daily. 30 tablet 0  . atorvastatin (LIPITOR) 40 MG tablet TAKE 1 TABLET BY MOUTH EVERY DAY AT 6 PM 30 tablet 0  . carvedilol (COREG) 25 MG tablet Take 2 tablets (50 mg total) by mouth 2 (two) times daily with a meal. 60 tablet 0  . cloNIDine (CATAPRES) 0.3 MG tablet Take 1 tablet (0.3 mg total) by mouth 2 (two) times daily. 60 tablet 3  . doxazosin (CARDURA) 2 MG tablet TAKE ONE (1) TABLET BY MOUTH EVERY 12 HOURS 60 tablet 2  . Fluticasone-Salmeterol (ADVAIR DISKUS) 250-50 MCG/DOSE AEPB INHALE 1 PUFF INTO THE LUNGS 2 TIMES DAILY  60 each 1  . hydrALAZINE (APRESOLINE) 100 MG tablet TAKE ONE TABLET BY MOUTH EVERY EIGHT HOURS 90 tablet 3  . insulin aspart (NOVOLOG FLEXPEN) 100 UNIT/ML FlexPen Inject 35 Units into the skin 3 (three) times daily with meals. 15 mL 0  . Insulin Glargine (LANTUS SOLOSTAR) 100 UNIT/ML Solostar Pen Inject 60 Units into the skin daily.    . isosorbide mononitrate (IMDUR) 60 MG 24 hr tablet TAKE ONE (1) TABLET BY MOUTH EVERY DAY 30 tablet 3  . levothyroxine (SYNTHROID, LEVOTHROID) 50 MCG tablet TAKE ONE (1) TABLET BY MOUTH EVERY DAY BEFORE BREAKFAST 30 tablet 3  . nitroGLYCERIN (NITROSTAT) 0.4 MG SL tablet Place 1 tablet (0.4 mg total) under the tongue every 5 (five) minutes as needed for chest pain. 30 tablet 12  . pantoprazole (PROTONIX) 40 MG tablet Take 1 tablet (40 mg total) by mouth daily. 30 tablet 1   . PROVENTIL HFA 108 (90 Base) MCG/ACT inhaler INHALE 2 PUFFS INTO THE LUNGS EVERY 6 HOURS AS NEEDED FOR WHEEZING ORSHORTNESS OF BREATH 6.7 g 3  . SURE COMFORT PEN NEEDLES 31G X 8 MM MISC USE WITH BOTH INSULIN PENS TWICE DAILY (4 PER DAY) 100 each 5  . torsemide (DEMADEX) 20 MG tablet Take 2 tabs in AM and 1 tab in PM 90 tablet 3   No current facility-administered medications for this encounter.      BP (!) 190/80   Pulse 77   Wt 228 lb (103.4 kg)   SpO2 95%   BMI 34.67 kg/m  General: NAD. Ambulated slowly in the clinic.  Neck: JVP to jaw  no thyromegaly or thyroid nodule.  Lungs: Clear to auscultation bilaterally with normal respiratory effort. CV: Nondisplaced PMI.  Heart regular S1/S2, no S3/S4, no murmur.  1+ edema bilaterally.  No carotid bruit.  Unable to palpate pedal pulses.  Abdomen: Soft, nontender, no hepatosplenomegaly, no distention.  Skin: Intact without lesions or rashes.  Neurologic: Alert and oriented x 3.  Psych: Normal affect. Extremities: No clubbing or cyanosis. 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  HEENT: Normal.    Assessment/Plan: 1. Chronic diastolic CHF:  This is worsened by stage IV CKD.  On exam, he does appear to be volume overloaded.  NYHA class II-III symptoms.  Multiple admissions this year. Volume status elevated.  - Increase torsemide to 40 qam/40 qpm.   Needs to limit fluid intake to < 2 liters per day.  2. HTN: BP remains high despite taking all his meds.  Continue clonidine to 0.3 mg bid.  He is on maximal Coreg, hydralazine, and amlodipine.  Increase doxazosin 4 mg twice a day.  Check BP next week. I have contacted [facility] to follow SBP.  3. CAD: S/p CABG.  No chest pain.  Continue ASA 81 mg  and statin.   4. COPD: Severe by 3/15 PFTs.  He has quit smoking.  5. CKD: Stage IV.  Sees Dr Marval Regal.  This significantly complicates CHF treatment.   6. PAD: Not particularly limited by claudication now.  Saw [facility] 6/17, has followup.   Encouraged to  take medications as directed.  Follow up in 1 week to reassess volume status and check BP. Check BMET and BNP.   Amy Clegg  NP-C  10/15/2015

## 2015-10-16 ENCOUNTER — Other Ambulatory Visit: Payer: Self-pay

## 2015-10-16 DIAGNOSIS — N183 Chronic kidney disease, stage 3 unspecified: Secondary | ICD-10-CM

## 2015-10-16 DIAGNOSIS — Z01810 Encounter for preprocedural cardiovascular examination: Secondary | ICD-10-CM

## 2015-10-17 ENCOUNTER — Other Ambulatory Visit: Payer: Self-pay | Admitting: Family Medicine

## 2015-10-17 ENCOUNTER — Telehealth (HOSPITAL_COMMUNITY): Payer: Self-pay

## 2015-10-17 ENCOUNTER — Other Ambulatory Visit: Payer: Self-pay | Admitting: Internal Medicine

## 2015-10-17 DIAGNOSIS — I509 Heart failure, unspecified: Secondary | ICD-10-CM

## 2015-10-17 DIAGNOSIS — I10 Essential (primary) hypertension: Secondary | ICD-10-CM

## 2015-10-17 NOTE — Telephone Encounter (Signed)
[facility] RN with [facility] called to confirm Torsemide dosage on behalf of patient after recent OV with CHF clinic. Patient states to [facility] RN that Torsemide was only to be increased to 40 mg twice daily for 3 days, then reduced back down to original dose of 40 mg in am and 20 mg in pm. However, Amy Clegg's note and AVS instructs patient to keep torsemide increased to 40 mg twice daily until his 1 week apt on the 19th. Advised to [facility] RN to keep this dose as is until that follow up appointment.  [facility] RN voiced understanding, patient aware and agreeable to plan, patient's pill box adjusted for the week accordingly.  Renee Pain, RN

## 2015-10-18 ENCOUNTER — Telehealth (HOSPITAL_COMMUNITY): Payer: Self-pay | Admitting: *Deleted

## 2015-10-18 MED ORDER — HYDRALAZINE HCL 100 MG PO TABS: ORAL_TABLET | ORAL | 3 refills | Status: DC

## 2015-10-18 MED ORDER — POTASSIUM CHLORIDE CRYS ER 20 MEQ PO TBCR: 20.0000 meq | EXTENDED_RELEASE_TABLET | Freq: Every day | ORAL | 3 refills | Status: DC

## 2015-10-18 NOTE — Telephone Encounter (Signed)
Notes Recorded by Harvie Junior, CMA on 10/18/2015 at 9:28 AM EDT Patient aware. Medication sent to pharmacy.    ------  Notes Recorded by Larey Dresser, MD on 10/16/2015 at 8:21 PM EDT Stable except K is a little low. Add an additional 20 mEq daily of KCl to his regimen.    Ref Range & Units 3d ago 3wk ago 67mo ago   Sodium 135 - 145 mmol/L 138  143R  134     Potassium 3.5 - 5.1 mmol/L 3.4   3.9R  3.8    Chloride 101 - 111 mmol/L 103  109R  97     CO2 22 - 32 mmol/L 23  24R  23    Glucose, Bld 65 - 99 mg/dL 339   117   266     BUN 6 - 20 mg/dL 39   43R   92     Creatinine, Ser 0.61 - 1.24 mg/dL 2.28   2.33R, CM   3.56     Calcium 8.9 - 10.3 mg/dL 8.4   8.6R  9.1    GFR calc non Af Amer >60 mL/min 29    17     GFR calc Af Amer >60 mL/min 33    19CM

## 2015-10-19 ENCOUNTER — Encounter (HOSPITAL_COMMUNITY): Payer: Medicaid Other

## 2015-10-22 NOTE — Progress Notes (Addendum)
Advanced Heart Failure Clinic Note   PCP: Dr. Juanito Doom Cardiology: Dr. Aundra Dubin  65 yo with history of CAD s/p CABG, PAD, chronic diastolic CHF, CKD stage IV, and HTN presents for cardiology followup.  He has had multiple admissions this year for diastolic CHF.  Last echo in 5/17 showed EF 60-65% with grade II diastolic dysfunction.  He also has COPD with PFTs showing severe obstruction.  Last admission for CHF was in 7/17.    Today he presents for weekly follow up.  At last visit torsemide and doxazosin increased. States he did not start potassium yet, has not been delivered. Says he missed some medicines Sunday night. He misses nightly medications 2-3 times a week. Denies SOB on flat ground, no SOB with showering or changing clothes. Weight at home 222 this am. [facility] following for tele-monitoring.  Drinks 3 bottles of water a day, and then several diet sodas on top of that.  SBP at home has been 150-170s.  Labs (8/17): K 3.9, creatinine 2.33, HCT 28.5 Labs (10/15/15: K 3.4, creatinine 2.28, HCT 29.8  PMH: 1. Chronic diastolic CHF: Multiple recent admissions.  Echo (5/17) with EF 60-65%, grade II diastolic dysfunction, normal RV size and systolic function.  2. PAD: Right femoral PCI in 12/14. Peripheral arterial dopplers in 6/17 with occluded right SFA.  He follows with [facility].  3. HTN: Renal artery dopplers negative in 5/17.  4. Hypothyroidism 5. CKD stage IV: Follows with Dr. Marval Regal. 6. CAD: s/p CABG x 3 in 3/15.  7. OSA: Mild, not on CPAP.  8. COPD: PFTs (3/15) with FVC 77%, FEV1 47%, ratio 59%, TLC 55% => severe obstruction.  Prior smoker.  9. 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 Hyperlipidemia 12. Anemia of renal disease  Social History   Social History  . Marital status: Single    Spouse name: N/A  . Number of children: N/A  . Years of education: N/A   Occupational History  . Not on file.   Social History Main Topics  . Smoking status: Former Smoker    Packs/day: 1.00    Years:  46.00    Types: Cigarettes    Start date: 02/04/1967    Quit date: 02/03/2013  . Smokeless tobacco: Never Used     Comment: Doing well with quitting.  . Alcohol use No  . Drug use: No  . Sexual activity: Not Currently   Other Topics Concern  . Not on file   Social History Narrative   Lives with sister, [name] in [place].  Retired - former Sports coach.   Family History  Problem Relation Age of Onset  . Cancer Mother     lung cancer  . Hypertension Mother   . Cancer Sister     patient thinks it was uterine cancer  . Hypertension Sister   . Heart attack Sister   . Stroke Neg Hx    ROS: All systems reviewed and negative except as per HPI.   Current Outpatient Prescriptions  Medication Sig Dispense Refill  . amLODipine (NORVASC) 10 MG tablet Take 1 tablet (10 mg total) by mouth daily. 30 tablet 1  . aspirin 81 MG tablet TAKE ONE (1) TABLET BY MOUTH EVERY DAY 30 tablet 3  . atorvastatin (LIPITOR) 40 MG tablet TAKE 1 TABLET BY MOUTH EVERY DAY AT 6 PM 30 tablet 0  . carvedilol (COREG) 25 MG tablet Take 2 tablets (50 mg total) by mouth 2 (two) times daily with a meal. 60 tablet 0  .  cloNIDine (CATAPRES) 0.3 MG tablet Take 1 tablet (0.3 mg total) by mouth 2 (two) times daily. 60 tablet 3  . doxazosin (CARDURA) 4 MG tablet Take 1 tablet (4 mg total) by mouth 2 (two) times daily. 60 tablet 6  . Fluticasone-Salmeterol (ADVAIR DISKUS) 250-50 MCG/DOSE AEPB INHALE 1 PUFF INTO THE LUNGS 2 TIMES DAILY 60 each 1  . hydrALAZINE (APRESOLINE) 100 MG tablet TAKE ONE TABLET BY MOUTH EVERY EIGHT HOURS 90 tablet 3  . insulin aspart (NOVOLOG FLEXPEN) 100 UNIT/ML FlexPen Inject 35 Units into the skin 3 (three) times daily with meals. 15 mL 0  . Insulin Glargine (LANTUS SOLOSTAR) 100 UNIT/ML Solostar Pen Inject 60 Units into the skin daily.    . isosorbide mononitrate (IMDUR) 60 MG 24 hr tablet TAKE ONE (1) TABLET BY MOUTH EVERY DAY 30 tablet 3  . levothyroxine (SYNTHROID, LEVOTHROID) 50 MCG tablet  TAKE ONE (1) TABLET BY MOUTH EVERY DAY BEFORE BREAKFAST 30 tablet 3  . pantoprazole (PROTONIX) 40 MG tablet TAKE 1 TABLET BY MOUTH DAILY 30 tablet 3  . PROVENTIL HFA 108 (90 Base) MCG/ACT inhaler INHALE 2 PUFFS INTO THE LUNGS EVERY 6 HOURS AS NEEDED FOR WHEEZING ORSHORTNESS OF BREATH 6.7 g 3  . SURE COMFORT PEN NEEDLES 31G X 8 MM MISC USE WITH BOTH INSULIN PENS TWICE DAILY (4 PER DAY) 100 each 5  . torsemide (DEMADEX) 20 MG tablet Take 2 tablets (40 mg total) by mouth 2 (two) times daily. 120 tablet 3  . nitroGLYCERIN (NITROSTAT) 0.4 MG SL tablet Place 1 tablet (0.4 mg total) under the tongue every 5 (five) minutes as needed for chest pain. (Patient not taking: Reported on 10/23/2015) 30 tablet 12  . potassium chloride SA (K-DUR,KLOR-CON) 20 MEQ tablet Take 1 tablet (20 mEq total) by mouth daily. (Patient not taking: Reported on 10/23/2015) 90 tablet 3   No current facility-administered medications for this encounter.      BP (!) 178/74 (BP Location: Left Arm, Patient Position: Sitting, Cuff Size: Normal)   Pulse 89   Wt 228 lb 12.8 oz (103.8 kg)   SpO2 94%   BMI 34.79 kg/m    Wt Readings from Last 3 Encounters:  10/23/15 228 lb 12.8 oz (103.8 kg)  10/15/15 228 lb (103.4 kg)  10/10/15 221 lb (100.2 kg)    General: NAD. Ambulated in clinic without difficulty Neck: JVP difficult to assess, appears at least 9-10 cm  no thyromegaly or thyroid nodule.  Lungs: CTAB, normal effort CV: Nondisplaced PMI.  Heart regular S1/S2, no S3/S4, no murmur.  1-2+ edema edema bilaterally.  No carotid bruit.  Unable to palpate pedal pulses.  Abdomen: Soft, NT, ND, no HSM. No bruits or masses. +BS  Skin: Intact without lesions or rashes.  Neurologic: Alert and oriented x 3.  Psych: Normal affect. Extremities: No clubbing or cyanosis. 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  HEENT: Normal.    Assessment/Plan: 1. Chronic diastolic CHF:  Complicated by CKD Stage IV. - Volume status does appear elevated in  setting of missing his evening meds at least 3 times a week.     - NYHA class II-III symptoms.  He has had multiple admissions this year, large component of this is poor insight and non-compliance.   - Continue torsemide 40 mg BID. BMET today.  He misses doses >3 times a week.   Stressed importance of medication compliance and limiting fluid to <2 L each day. ( Currently drinking closer to 3-3.5 L) - Start  potassium 20 meq daily.  2. HTN: BP remains elevated in the setting of non-compliance.  - Continue clonidine 0.3 mg bid.   - Continue Coreg 50 mg BID, Hydralazine 100 mg TID, and Imdur 60 mg daily - Continue Amlodipine 10 mg daily.  - Continue doxazosin 4 mg BID. I have asked him to check BPs at home and If regularly > 170 we will need to up-titrate this further. [facility] following SBP.  3. CAD: S/p CABG.  No chest pain.  Continue ASA 81 mg and statin.   4. COPD: Severe by 3/15 PFTs.  - He continues to not smoke.  5. CKD Stage IV.  Sees Dr Marval Regal.  - Limits medication and diuresis.    6. PAD: Not particularly limited by claudication now.  Saw [facility] 6/17, has followup.   Discussed medication compliance and fluid limitation in depth. Have asked to monitor daily BPs and fluid intake.  Knows to call if BPs regularly over 170 at home. Have also discussed these changes with Par[facility].   BMET today. Follow up 2 weeks.   Satira Mccallum Kamarie Veno  PA-C  10/23/2015

## 2015-10-23 ENCOUNTER — Ambulatory Visit (HOSPITAL_COMMUNITY)
Admission: RE | Admit: 2015-10-23 | Discharge: 2015-10-23 | Disposition: A | Discharge status: Home / Self Care | Payer: Medicaid Other | Source: Ambulatory Visit | Attending: Cardiology | Admitting: Cardiology

## 2015-10-23 ENCOUNTER — Telehealth (HOSPITAL_COMMUNITY): Payer: Self-pay | Admitting: Vascular Surgery

## 2015-10-23 ENCOUNTER — Telehealth (HOSPITAL_COMMUNITY): Payer: Self-pay

## 2015-10-23 ENCOUNTER — Other Ambulatory Visit (HOSPITAL_COMMUNITY): Payer: Self-pay | Admitting: *Deleted

## 2015-10-23 VITALS — BP 178/74 | HR 89 | Wt 228.8 lb

## 2015-10-23 DIAGNOSIS — I130 Hypertensive heart and chronic kidney disease with heart failure and stage 1 through stage 4 chronic kidney disease, or unspecified chronic kidney disease: Secondary | ICD-10-CM | POA: Insufficient documentation

## 2015-10-23 DIAGNOSIS — J449 Chronic obstructive pulmonary disease, unspecified: Secondary | ICD-10-CM | POA: Diagnosis not present

## 2015-10-23 DIAGNOSIS — Z951 Presence of aortocoronary bypass graft: Secondary | ICD-10-CM

## 2015-10-23 DIAGNOSIS — E782 Mixed hyperlipidemia: Secondary | ICD-10-CM

## 2015-10-23 DIAGNOSIS — E1151 Type 2 diabetes mellitus with diabetic peripheral angiopathy without gangrene: Secondary | ICD-10-CM | POA: Diagnosis not present

## 2015-10-23 DIAGNOSIS — I2510 Atherosclerotic heart disease of native coronary artery without angina pectoris: Secondary | ICD-10-CM | POA: Diagnosis not present

## 2015-10-23 DIAGNOSIS — I739 Peripheral vascular disease, unspecified: Secondary | ICD-10-CM

## 2015-10-23 DIAGNOSIS — Z79899 Other long term (current) drug therapy: Secondary | ICD-10-CM | POA: Insufficient documentation

## 2015-10-23 DIAGNOSIS — Z7982 Long term (current) use of aspirin: Secondary | ICD-10-CM | POA: Diagnosis not present

## 2015-10-23 DIAGNOSIS — N184 Chronic kidney disease, stage 4 (severe): Secondary | ICD-10-CM | POA: Diagnosis not present

## 2015-10-23 DIAGNOSIS — Z9119 Patient's noncompliance with other medical treatment and regimen: Secondary | ICD-10-CM | POA: Insufficient documentation

## 2015-10-23 DIAGNOSIS — Z87891 Personal history of nicotine dependence: Secondary | ICD-10-CM | POA: Diagnosis not present

## 2015-10-23 DIAGNOSIS — I10 Essential (primary) hypertension: Secondary | ICD-10-CM | POA: Insufficient documentation

## 2015-10-23 DIAGNOSIS — I5032 Chronic diastolic (congestive) heart failure: Secondary | ICD-10-CM

## 2015-10-23 DIAGNOSIS — E1122 Type 2 diabetes mellitus with diabetic chronic kidney disease: Secondary | ICD-10-CM | POA: Diagnosis not present

## 2015-10-23 DIAGNOSIS — I1A0 Resistant hypertension: Secondary | ICD-10-CM

## 2015-10-23 DIAGNOSIS — Z794 Long term (current) use of insulin: Secondary | ICD-10-CM | POA: Diagnosis not present

## 2015-10-23 LAB — BASIC METABOLIC PANEL
ANION GAP: 13 (ref 5–15)
BUN: 44 mg/dL — ABNORMAL HIGH (ref 6–20)
CALCIUM: 8.7 mg/dL — AB (ref 8.9–10.3)
CO2: 22 mmol/L (ref 22–32)
CREATININE: 2.42 mg/dL — AB (ref 0.61–1.24)
Chloride: 106 mmol/L (ref 101–111)
GFR, EST AFRICAN AMERICAN: 31 mL/min — AB (ref >60)
GFR, EST NON AFRICAN AMERICAN: 27 mL/min — AB (ref >60)
GLUCOSE: 126 mg/dL — AB (ref 65–99)
Potassium: 3.3 mmol/L — ABNORMAL LOW (ref 3.5–5.1)
Sodium: 141 mmol/L (ref 135–145)

## 2015-10-23 MED ORDER — HYDRALAZINE HCL 100 MG PO TABS: ORAL_TABLET | ORAL | 3 refills | Status: DC

## 2015-10-23 MED ORDER — CARVEDILOL 25 MG PO TABS: 50.0000 mg | ORAL_TABLET | Freq: Two times a day (BID) | ORAL | 3 refills | Status: DC

## 2015-10-23 NOTE — Addendum Note (Signed)
Encounter addended by: Shirley Friar, PA-C on: 10/23/2015 10:13 AM<BR>    Actions taken: LOS modified, Follow-up modified, Sign clinical note

## 2015-10-23 NOTE — Addendum Note (Signed)
Encounter addended by: Effie Berkshire, RN on: 10/23/2015  9:43 AM<BR>    Actions taken: Diagnosis association updated, Order Entry activity accessed

## 2015-10-23 NOTE — Telephone Encounter (Signed)
Left pt message to give 2 week f/u appt

## 2015-10-23 NOTE — Patient Instructions (Signed)
Take all afternoon medications with dinner instead of bedtime.  Refills sent for carvedilol and hydralazine  Routine lab work today. Will notify you of abnormal results, otherwise no news is good news!  Follow up 2 weeks with Oda Kilts PA-C.  Do the following things EVERYDAY: 1) Weigh yourself in the morning before breakfast. Write it down and keep it in a log. 2) Take your medicines as prescribed 3) Eat low salt foods-Limit salt (sodium) to 2000 mg per day.  4) Stay as active as you can everyday 5) Limit all fluids for the day to less than 2 liters

## 2015-10-23 NOTE — Telephone Encounter (Signed)
[facility] calling CHF clinic triage to clarify quantity of coreg sent today after patient's OV. Resent Rx with correct quantity for 30 day supply as requested.  Renee Pain, RN

## 2015-10-23 NOTE — Progress Notes (Addendum)
Advanced Heart Failure Medication Review by a Pharmacist  Does the patient  feel that his/her medications are working for him/her?  yes  Has the patient been experiencing any side effects to the medications prescribed?  no  Does the patient measure his/her own blood pressure or blood glucose at home?  no   Does the patient have any problems obtaining medications due to transportation or finances?   no  Understanding of regimen: good Understanding of indications: good Potential of compliance: good Patient understands to avoid NSAIDs. Patient understands to avoid decongestants.  Issues to address at subsequent visits: None   Pharmacist comments:  [name] is a pleasant 65 yo M presenting with his medication bottles. He reports good compliance with his regimen but did state that [name] has not yet delivered his KCl so he has not started it yet. Spoke with [name] who stated it will be delivered today. He did not have any other medication-related questions or concerns for me at this time.   Ruta Hinds. Velva Harman, PharmD, BCPS, CPP Clinical Pharmacist Pager: [phone] Phone: [phone] 10/23/2015 9:00 AM      Time with patient: 10 minutes Preparation and documentation time: 2 minutes Total time: 12 minutes

## 2015-10-24 ENCOUNTER — Encounter (HOSPITAL_COMMUNITY): Payer: Self-pay

## 2015-10-24 ENCOUNTER — Ambulatory Visit (INDEPENDENT_AMBULATORY_CARE_PROVIDER_SITE_OTHER)
Admission: RE | Admit: 2015-10-24 | Discharge: 2015-10-24 | Disposition: A | Discharge status: Home / Self Care | Payer: Medicaid Other | Source: Ambulatory Visit | Attending: Vascular Surgery | Admitting: Vascular Surgery

## 2015-10-24 ENCOUNTER — Ambulatory Visit (HOSPITAL_COMMUNITY)
Admission: RE | Admit: 2015-10-24 | Discharge: 2015-10-24 | Disposition: A | Discharge status: Home / Self Care | Payer: Medicaid Other | Source: Ambulatory Visit | Attending: Nephrology | Admitting: Nephrology

## 2015-10-24 DIAGNOSIS — I130 Hypertensive heart and chronic kidney disease with heart failure and stage 1 through stage 4 chronic kidney disease, or unspecified chronic kidney disease: Secondary | ICD-10-CM | POA: Diagnosis not present

## 2015-10-24 DIAGNOSIS — N183 Chronic kidney disease, stage 3 unspecified: Secondary | ICD-10-CM

## 2015-10-24 DIAGNOSIS — Z794 Long term (current) use of insulin: Principal | ICD-10-CM

## 2015-10-24 DIAGNOSIS — Z01810 Encounter for preprocedural cardiovascular examination: Secondary | ICD-10-CM

## 2015-10-24 DIAGNOSIS — E1122 Type 2 diabetes mellitus with diabetic chronic kidney disease: Secondary | ICD-10-CM

## 2015-10-24 LAB — COMPREHENSIVE METABOLIC PANEL
ALBUMIN: 3.4 g/dL — AB (ref 3.5–5.0)
ALK PHOS: 99 U/L (ref 38–126)
ALT: 13 U/L — ABNORMAL LOW (ref 17–63)
AST: 16 U/L (ref 15–41)
Anion gap: 9 (ref 5–15)
BILIRUBIN TOTAL: 0.3 mg/dL (ref 0.3–1.2)
BUN: 50 mg/dL — AB (ref 6–20)
CALCIUM: 8.4 mg/dL — AB (ref 8.9–10.3)
CO2: 26 mmol/L (ref 22–32)
CREATININE: 2.51 mg/dL — AB (ref 0.61–1.24)
Chloride: 103 mmol/L (ref 101–111)
GFR calc Af Amer: 30 mL/min — ABNORMAL LOW (ref >60)
GFR, EST NON AFRICAN AMERICAN: 25 mL/min — AB (ref >60)
GLUCOSE: 159 mg/dL — AB (ref 65–99)
Potassium: 3.3 mmol/L — ABNORMAL LOW (ref 3.5–5.1)
Sodium: 138 mmol/L (ref 135–145)
TOTAL PROTEIN: 6.7 g/dL (ref 6.5–8.1)

## 2015-10-24 LAB — HEMOGLOBIN: Hemoglobin: 8.3 g/dL — ABNORMAL LOW (ref 13.0–17.0)

## 2015-10-24 MED ORDER — DARBEPOETIN ALFA 60 MCG/0.3ML IJ SOSY
60.0000 ug | PREFILLED_SYRINGE | INTRAMUSCULAR | Status: DC
  Administered 2015-10-24: 60 ug via SUBCUTANEOUS
  Filled 2015-10-24: qty 0.3

## 2015-10-24 NOTE — Progress Notes (Signed)
Uneventful Aranesp injection. Instructed Mr. Leonard [name] to call Dr. Geanie Berlin for problems and concerns once discharged from Short Stay. Patient verbalized understanding.

## 2015-10-24 NOTE — Discharge Instructions (Signed)
Darbepoetin Alfa injection What is this medicine? DARBEPOETIN ALFA (dar be POE e tin AL fa) helps your body make more red blood cells. It is used to treat anemia caused by chronic kidney failure and chemotherapy. This medicine may be used for other purposes; ask your health care provider or pharmacist if you have questions. What should I tell my health care provider before I take this medicine? They need to know if you have any of these conditions: -blood clotting disorders or history of blood clots -cancer patient not on chemotherapy -cystic fibrosis -heart disease, such as angina, heart failure, or a history of a heart attack -hemoglobin level of 12 g/dL or greater -high blood pressure -low levels of folate, iron, or vitamin B12 -seizures -an unusual or allergic reaction to darbepoetin, erythropoietin, albumin, hamster proteins, latex, other medicines, foods, dyes, or preservatives -pregnant or trying to get pregnant -breast-feeding How should I use this medicine? This medicine is for injection into a vein or under the skin. It is usually given by a health care professional in a hospital or clinic setting. If you get this medicine at home, you will be taught how to prepare and give this medicine. Do not shake the solution before you withdraw a dose. Use exactly as directed. Take your medicine at regular intervals. Do not take your medicine more often than directed. It is important that you put your used needles and syringes in a special sharps container. Do not put them in a trash can. If you do not have a sharps container, call your pharmacist or healthcare provider to get one. Talk to your pediatrician regarding the use of this medicine in children. While this medicine may be used in children as young as 1 year for selected conditions, precautions do apply. Overdosage: If you think you have taken too much of this medicine contact a poison control center or emergency room at once. NOTE:  This medicine is only for you. Do not share this medicine with others. What if I miss a dose? If you miss a dose, take it as soon as you can. If it is almost time for your next dose, take only that dose. Do not take double or extra doses. What may interact with this medicine? Do not take this medicine with any of the following medications: -epoetin alfa This list may not describe all possible interactions. Give your health care provider a list of all the medicines, herbs, non-prescription drugs, or dietary supplements you use. Also tell them if you smoke, drink alcohol, or use illegal drugs. Some items may interact with your medicine. What should I watch for while using this medicine? Visit your prescriber or health care professional for regular checks on your progress and for the needed blood tests and blood pressure measurements. It is especially important for the doctor to make sure your hemoglobin level is in the desired range, to limit the risk of potential side effects and to give you the best benefit. Keep all appointments for any recommended tests. Check your blood pressure as directed. Ask your doctor what your blood pressure should be and when you should contact him or her. As your body makes more red blood cells, you may need to take iron, folic acid, or vitamin B supplements. Ask your doctor or health care provider which products are right for you. If you have kidney disease continue dietary restrictions, even though this medication can make you feel better. Talk with your doctor or health care professional about the   foods you eat and the vitamins that you take. What side effects may I notice from receiving this medicine? Side effects that you should report to your doctor or health care professional as soon as possible: -allergic reactions like skin rash, itching or hives, swelling of the face, lips, or tongue -breathing problems -changes in vision -chest pain -confusion, trouble speaking  or understanding -feeling faint or lightheaded, falls -high blood pressure -muscle aches or pains -pain, swelling, warmth in the leg -rapid weight gain -severe headaches -sudden numbness or weakness of the face, arm or leg -trouble walking, dizziness, loss of balance or coordination -seizures (convulsions) -swelling of the ankles, feet, hands -unusually weak or tired Side effects that usually do not require medical attention (report to your doctor or health care professional if they continue or are bothersome): -diarrhea -fever, chills (flu-like symptoms) -headaches -nausea, vomiting -redness, stinging, or swelling at site where injected This list may not describe all possible side effects. Call your doctor for medical advice about side effects. You may report side effects to FDA at 1-800-FDA-1088. Where should I keep my medicine? Keep out of the reach of children. Store in a refrigerator between 2 and 8 degrees C (36 and 46 degrees F). Do not freeze. Do not shake. Throw away any unused portion if using a single-dose vial. Throw away any unused medicine after the expiration date. NOTE: This sheet is a summary. It may not cover all possible information. If you have questions about this medicine, talk to your doctor, pharmacist, or health care provider.    2016, [employer] Standard. (2008-01-04 10:23:57)  

## 2015-10-25 ENCOUNTER — Other Ambulatory Visit (HOSPITAL_BASED_OUTPATIENT_CLINIC_OR_DEPARTMENT_OTHER): Payer: Medicaid Other

## 2015-10-25 ENCOUNTER — Ambulatory Visit: Payer: Medicaid Other

## 2015-10-25 DIAGNOSIS — N189 Chronic kidney disease, unspecified: Principal | ICD-10-CM

## 2015-10-25 DIAGNOSIS — D631 Anemia in chronic kidney disease: Secondary | ICD-10-CM

## 2015-10-25 LAB — PTH, INTACT AND CALCIUM

## 2015-10-25 LAB — CBC & DIFF AND RETIC
BASO%: 0.3 % (ref 0.0–2.0)
BASOS ABS: 0 10*3/uL (ref 0.0–0.1)
EOS ABS: 0.5 10*3/uL (ref 0.0–0.5)
EOS%: 7.1 % — ABNORMAL HIGH (ref 0.0–7.0)
HEMATOCRIT: 27.6 % — AB (ref 38.4–49.9)
HEMOGLOBIN: 8.4 g/dL — AB (ref 13.0–17.1)
Immature Retic Fract: 5.6 % (ref 3.00–10.60)
LYMPH#: 0.9 10*3/uL (ref 0.9–3.3)
LYMPH%: 13.1 % — ABNORMAL LOW (ref 14.0–49.0)
MCH: 23 pg — ABNORMAL LOW (ref 27.2–33.4)
MCHC: 30.4 g/dL — AB (ref 32.0–36.0)
MCV: 75.4 fL — ABNORMAL LOW (ref 79.3–98.0)
MONO#: 0.5 10*3/uL (ref 0.1–0.9)
MONO%: 7.3 % (ref 0.0–14.0)
NEUT#: 4.9 10*3/uL (ref 1.5–6.5)
NEUT%: 72.2 % (ref 39.0–75.0)
Platelets: 198 10*3/uL (ref 140–400)
RBC: 3.66 10*6/uL — ABNORMAL LOW (ref 4.20–5.82)
RDW: 19.7 % — AB (ref 11.0–14.6)
RETIC %: 1.22 % (ref 0.80–1.80)
RETIC CT ABS: 44.65 10*3/uL (ref 34.80–93.90)
WBC: 6.7 10*3/uL (ref 4.0–10.3)

## 2015-10-25 MED ORDER — DARBEPOETIN ALFA 500 MCG/ML IJ SOSY
500.0000 ug | PREFILLED_SYRINGE | Freq: Once | INTRAMUSCULAR | Status: DC
  Filled 2015-10-25: qty 1

## 2015-10-25 NOTE — Progress Notes (Signed)
Noted pt received 60 mcg Aranesp injection on 9/20. Reviewed with [name]: HOLD injection today. Spoke with pt in lobby, he was not aware that it was the same injection. Instructed him to follow up as scheduled unless told otherwise by our office or nephrologist. He voiced understanding.

## 2015-10-26 ENCOUNTER — Encounter: Payer: Self-pay | Admitting: Vascular Surgery

## 2015-10-31 ENCOUNTER — Ambulatory Visit (INDEPENDENT_AMBULATORY_CARE_PROVIDER_SITE_OTHER): Payer: Medicaid Other | Admitting: Vascular Surgery

## 2015-10-31 ENCOUNTER — Encounter: Payer: Self-pay | Admitting: Vascular Surgery

## 2015-10-31 VITALS — BP 134/62 | HR 91 | Temp 98.0°F | Resp 18 | Ht 68.0 in | Wt 224.0 lb

## 2015-10-31 DIAGNOSIS — N184 Chronic kidney disease, stage 4 (severe): Secondary | ICD-10-CM | POA: Diagnosis not present

## 2015-10-31 DIAGNOSIS — I70261 Atherosclerosis of native arteries of extremities with gangrene, right leg: Secondary | ICD-10-CM

## 2015-10-31 NOTE — Progress Notes (Signed)
Referred by:  [name], MD [place], [place] [address]  Reason for referral: New access  History of Present Illness  Leonard [name] is a 65 y.o. (May 10, 1950) male who presents for evaluation for permanent access.  The patient is right hand dominant.  The patient has not had previous access procedures.  Previous central venous cannulation procedures include: none.  The patient has never had a PPM placed.   This patient's has significant HTN, HLD, and DM.    Pt was lost to follow up for a OA+PTA R SFA on 01/13/13.  Pt underwent open amp of R 4-5th toes with Dr. Donnetta Hutching on 01/29/16.  Past Medical History:  Diagnosis Date  . CHF (congestive heart failure) ([place])   . Gangrene of toe ([place])    right 5th/notes 01/04/2013   . High cholesterol   . Hypertension   . Iron deficiency anemia 08/03/2015  . 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 PAD (peripheral artery disease) ([place])   . Shortness of breath dyspnea   . Type II diabetes mellitus ([place])     Past Surgical History:  Procedure Laterality Date  . AMPUTATION Right 01/28/2013   Procedure: RAY AMPUTATION RIGHT  4th & 5th TOE;  Surgeon: Rosetta Posner, MD;  Location: [facility] OR;  Service: Vascular;  Laterality: Right;  . ANGIOPLASTY / STENTING FEMORAL Right 01/13/2013   superficial femoral artery x 2 (3 mm x 60 mm, 4 mm x 60 mm)  . CORONARY ARTERY BYPASS GRAFT N/A 04/07/2013   Procedure: CORONARY ARTERY BYPASS GRAFTING (CABG);  Surgeon: Ivin Poot, MD;  Location: [facility];  Service: Open Heart Surgery;  Laterality: N/A;  . INTRAOPERATIVE TRANSESOPHAGEAL ECHOCARDIOGRAM N/A 04/07/2013   Procedure: INTRAOPERATIVE TRANSESOPHAGEAL ECHOCARDIOGRAM;  Surgeon: Ivin Poot, MD;  Location: [facility];  Service: Open Heart Surgery;  Laterality: N/A;  . LEFT HEART CATHETERIZATION WITH CORONARY ANGIOGRAM N/A 04/05/2013   Procedure: LEFT HEART CATHETERIZATION WITH CORONARY ANGIOGRAM;  Surgeon: Peter M Martinique, MD;   Location: [facility] CATH LAB;  Service: Cardiovascular;  Laterality: N/A;  . LOWER EXTREMITY ANGIOGRAM Bilateral 01/06/2013   Procedure: LOWER EXTREMITY ANGIOGRAM;  Surgeon: Conrad Los Banos, MD;  Location: [facility] CATH LAB;  Service: Cardiovascular;  Laterality: Bilateral;  . LOWER EXTREMITY ANGIOGRAM Right 01/13/2013   Procedure: LOWER EXTREMITY ANGIOGRAM;  Surgeon: Conrad Moscow, MD;  Location: [facility] CATH LAB;  Service: Cardiovascular;  Laterality: Right;  . SKIN CANCER EXCISION  2017  . THROAT SURGERY  1983   polyps  . TONSILLECTOMY AND ADENOIDECTOMY  ~ 89    Social History   Social History  . Marital status: Single    Spouse name: N/A  . Number of children: N/A  . Years of education: N/A   Occupational History  . Not on file.   Social History Main Topics  . Smoking status: Former Smoker    Packs/day: 1.00    Years: 46.00    Types: Cigarettes    Start date: 02/04/1967    Quit date: 02/03/2013  . Smokeless tobacco: Never Used     Comment: Doing well with quitting.  . Alcohol use No  . Drug use: No  . Sexual activity: Not Currently   Other Topics Concern  . Not on file   Social History Narrative   Lives with sister, [name] in [place].  Retired - former Sports coach.    Family History  Problem Relation Age of Onset  . Cancer Mother  lung cancer  . Hypertension Mother   . Cancer Sister     patient thinks it was uterine cancer  . Hypertension Sister   . Heart attack Sister   . Stroke Neg Hx     Current Outpatient Prescriptions  Medication Sig Dispense Refill  . amLODipine (NORVASC) 10 MG tablet Take 1 tablet (10 mg total) by mouth daily. 30 tablet 1  . aspirin 81 MG tablet TAKE ONE (1) TABLET BY MOUTH EVERY DAY 30 tablet 3  . atorvastatin (LIPITOR) 40 MG tablet TAKE 1 TABLET BY MOUTH EVERY DAY AT 6 PM 30 tablet 0  . carvedilol (COREG) 25 MG tablet Take 2 tablets (50 mg total) by mouth 2 (two) times daily with a meal. 120 tablet 3  . cloNIDine (CATAPRES) 0.3 MG tablet  Take 1 tablet (0.3 mg total) by mouth 2 (two) times daily. 60 tablet 3  . doxazosin (CARDURA) 4 MG tablet Take 1 tablet (4 mg total) by mouth 2 (two) times daily. 60 tablet 6  . Fluticasone-Salmeterol (ADVAIR DISKUS) 250-50 MCG/DOSE AEPB INHALE 1 PUFF INTO THE LUNGS 2 TIMES DAILY 60 each 1  . hydrALAZINE (APRESOLINE) 100 MG tablet TAKE ONE TABLET BY MOUTH EVERY EIGHT HOURS 90 tablet 3  . insulin aspart (NOVOLOG FLEXPEN) 100 UNIT/ML FlexPen Inject 35 Units into the skin 3 (three) times daily with meals. 15 mL 0  . Insulin Glargine (LANTUS SOLOSTAR) 100 UNIT/ML Solostar Pen Inject 60 Units into the skin daily.    . isosorbide mononitrate (IMDUR) 60 MG 24 hr tablet TAKE ONE (1) TABLET BY MOUTH EVERY DAY 30 tablet 3  . levothyroxine (SYNTHROID, LEVOTHROID) 50 MCG tablet TAKE ONE (1) TABLET BY MOUTH EVERY DAY BEFORE BREAKFAST 30 tablet 3  . nitroGLYCERIN (NITROSTAT) 0.4 MG SL tablet Place 1 tablet (0.4 mg total) under the tongue every 5 (five) minutes as needed for chest pain. 30 tablet 12  . pantoprazole (PROTONIX) 40 MG tablet TAKE 1 TABLET BY MOUTH DAILY 30 tablet 3  . potassium chloride SA (K-DUR,KLOR-CON) 20 MEQ tablet Take 1 tablet (20 mEq total) by mouth daily. 90 tablet 3  . PROVENTIL HFA 108 (90 Base) MCG/ACT inhaler INHALE 2 PUFFS INTO THE LUNGS EVERY 6 HOURS AS NEEDED FOR WHEEZING ORSHORTNESS OF BREATH 6.7 g 3  . SURE COMFORT PEN NEEDLES 31G X 8 MM MISC USE WITH BOTH INSULIN PENS TWICE DAILY (4 PER DAY) 100 each 5  . torsemide (DEMADEX) 20 MG tablet Take 2 tablets (40 mg total) by mouth 2 (two) times daily. 120 tablet 3   No current facility-administered medications for this visit.     No Known Allergies  REVIEW OF SYSTEMS:   Cardiac:  positive for: no symptoms, negative for: Chest pain or chest pressure, Shortness of breath upon exertion and Shortness of breath when lying flat,   Vascular:  positive for: Leg swelling,  negative for: Pain in calf, thigh, or hip brought on by  ambulation, Pain in feet at night that wakes you up from your sleep and Blood clot in your veins  Pulmonary:  positive for: no symptoms,  negative for: Oxygen at home, Productive cough and Wheezing  Neurologic:  positive for: Problems with dizziness, negative for: Sudden weakness in arms or legs, Sudden numbness in arms or legs, Sudden onset of difficulty speaking or slurred speech, Temporary loss of vision in one eye and Problems with dizziness  Gastrointestinal:  positive for: no symptoms, negative for: Blood in stool and Vomited blood  Genitourinary:  positive for: no symptoms, negative for: Burning when urinating and Blood in urine  Psychiatric:  positive for: no symptoms,  negative for: Major depression  Hematologic:  positive for: no symptoms,  negative for: negative for: Bleeding problems and Problems with blood clotting too easily  Dermatologic:  positive for: no symptoms, negative for: Rashes or ulcers  Constitutional:  positive for: no symptoms, negative for: Fever or chills   Physical Examination  Vitals:   10/31/15 1137  BP: 134/62  Pulse: 91  Resp: 18  Temp: 98 F (36.7 C)  SpO2: 99%  Weight: 224 lb (101.6 kg)  Height: 5\' 8"  (1.727 m)    Body mass index is 34.06 kg/m.  General: Alert, O x 3, Obese,NAD  Head: [place]/AT,   Ear/Nose/Throat: Hearing grossly intact, nares without erythema or drainage, oropharynx without Erythema or Exudate , Mallampati score: 3, Dentition intact  Eyes: PERRLA, EOMI,   Neck: Supple, mid-line trachea,    Pulmonary: Sym exp, good B air movt,CTA B  Cardiac: RRR, Nl S1, S2, no Murmurs, No rubs, No S3,S4  Vascular: Vessel Right Left  Radial Palpable Palpable  Brachial Palpable Palpable  Carotid Palpable, No Bruit Palpable, No Bruit  Aorta Not palpable N/A  Femoral Palpable Palpable  Popliteal Not palpable Not palpable  PT Not palpable Not palpable  DP Palpable Not palpable   Gastrointestinal: soft,  non-distended, non-tender to palpation, No guarding or rebound, no HSM, no masses, no CVAT B, No palpable prominent aortic pulse due to pannus    Musculoskeletal: M/S 5/5 throughout  , Extremities without ischemic changes except healed R 4th and 5th toe amp, Edema in B legs,  , No LDS present  Neurologic: CN 2-12 intact , Pain and light touch intact in extremities , Motor exam as listed above  Psychiatric: Judgement intact, Mood & affect appropriate for pt's clinical situation  Dermatologic: See M/S exam for extremity exam, No rashes otherwise noted  Lymph : Palpable lymph nodes: None   Non-Invasive Vascular Imaging  Vein Mapping  (Date: 10/24/15):   R arm: acceptable vein conduits include entire cephalic vein and upper arm basilic  L arm: acceptable vein conduits include entire cephalic vein and upper arm basilic  BUE Doppler (Date: 10/31/2015):   R arm:   Brachial: tri,   Radial: tri,   Ulnar: tri,   L arm:   Brachial: tri,   Radial: tri,   Ulnar: tri,    Outside Studies/Documentation 10 pages of outside documents were reviewed including: outpatient PCP and nephrology charts.   Medical Decision Making  QUENTION MCNEILL is a 65 y.o. male who presents with chronic kidney disease stage IV, RLE PAD, s/p OA+PTA R SFA, s/p R 4th and 5th toe amp    Based on vein mapping and examination, this patient's permanent access options include: L RC vs BC AVF, R RC vs BC AVF, B staged BVT.  I would start with L RC vs BC AVF.  Pt has not made a decision on HD and has not undergone the classes, so he is still undecided.  I had an extensive discussion with this patient in regards to the nature of access surgery, including risk, benefits, and alternatives.    The patient is aware that the risks of access surgery include but are not limited to: bleeding, infection, steal syndrome, nerve damage, ischemic monomelic neuropathy, failure of access to mature, and possible need for  additional access procedures in the future.  Additionally, this patient will return for  his surveillance studies: BLE ABI, RLE arterial duplex.   Adele Barthel, MD [facility] Office: [phone] Pager: [phone]  10/31/2015, 12:27 PM

## 2015-11-05 ENCOUNTER — Telehealth: Payer: Self-pay | Admitting: *Deleted

## 2015-11-05 NOTE — Telephone Encounter (Signed)
"  This is Optician, dispensing with [employer].  Calling to confirm appointments for this client."  Advised appointment is scheduled on October 5 th beginning at 10:45 am.

## 2015-11-06 NOTE — Addendum Note (Signed)
Addended by: Kaleen Mask on: 11/06/2015 01:15 PM   Modules accepted: Orders

## 2015-11-07 ENCOUNTER — Encounter (HOSPITAL_COMMUNITY): Payer: Self-pay

## 2015-11-07 ENCOUNTER — Ambulatory Visit (HOSPITAL_COMMUNITY)
Admission: RE | Admit: 2015-11-07 | Discharge: 2015-11-07 | Disposition: A | Discharge status: Home / Self Care | Payer: Medicaid Other | Source: Ambulatory Visit | Attending: Internal Medicine | Admitting: Internal Medicine

## 2015-11-07 VITALS — BP 218/88 | HR 70 | Ht 68.0 in | Wt 221.8 lb

## 2015-11-07 DIAGNOSIS — I5032 Chronic diastolic (congestive) heart failure: Secondary | ICD-10-CM

## 2015-11-07 DIAGNOSIS — I2510 Atherosclerotic heart disease of native coronary artery without angina pectoris: Secondary | ICD-10-CM

## 2015-11-07 DIAGNOSIS — Z794 Long term (current) use of insulin: Secondary | ICD-10-CM | POA: Diagnosis not present

## 2015-11-07 DIAGNOSIS — Z7982 Long term (current) use of aspirin: Secondary | ICD-10-CM | POA: Diagnosis not present

## 2015-11-07 DIAGNOSIS — E785 Hyperlipidemia, unspecified: Secondary | ICD-10-CM

## 2015-11-07 DIAGNOSIS — Z79899 Other long term (current) drug therapy: Secondary | ICD-10-CM | POA: Insufficient documentation

## 2015-11-07 DIAGNOSIS — Z951 Presence of aortocoronary bypass graft: Secondary | ICD-10-CM | POA: Diagnosis not present

## 2015-11-07 DIAGNOSIS — Z9119 Patient's noncompliance with other medical treatment and regimen: Secondary | ICD-10-CM | POA: Diagnosis not present

## 2015-11-07 DIAGNOSIS — J449 Chronic obstructive pulmonary disease, unspecified: Secondary | ICD-10-CM | POA: Diagnosis not present

## 2015-11-07 DIAGNOSIS — Z87891 Personal history of nicotine dependence: Secondary | ICD-10-CM

## 2015-11-07 DIAGNOSIS — I160 Hypertensive urgency: Secondary | ICD-10-CM

## 2015-11-07 DIAGNOSIS — I739 Peripheral vascular disease, unspecified: Secondary | ICD-10-CM

## 2015-11-07 DIAGNOSIS — E1122 Type 2 diabetes mellitus with diabetic chronic kidney disease: Secondary | ICD-10-CM | POA: Insufficient documentation

## 2015-11-07 DIAGNOSIS — E1151 Type 2 diabetes mellitus with diabetic peripheral angiopathy without gangrene: Secondary | ICD-10-CM | POA: Insufficient documentation

## 2015-11-07 DIAGNOSIS — I130 Hypertensive heart and chronic kidney disease with heart failure and stage 1 through stage 4 chronic kidney disease, or unspecified chronic kidney disease: Secondary | ICD-10-CM | POA: Insufficient documentation

## 2015-11-07 DIAGNOSIS — I1A0 Resistant hypertension: Secondary | ICD-10-CM

## 2015-11-07 DIAGNOSIS — N184 Chronic kidney disease, stage 4 (severe): Secondary | ICD-10-CM | POA: Diagnosis not present

## 2015-11-07 DIAGNOSIS — I10 Essential (primary) hypertension: Secondary | ICD-10-CM | POA: Diagnosis not present

## 2015-11-07 DIAGNOSIS — E782 Mixed hyperlipidemia: Secondary | ICD-10-CM | POA: Diagnosis not present

## 2015-11-07 LAB — BASIC METABOLIC PANEL
Anion gap: 11 (ref 5–15)
BUN: 34 mg/dL — AB (ref 6–20)
CALCIUM: 8.7 mg/dL — AB (ref 8.9–10.3)
CO2: 22 mmol/L (ref 22–32)
CREATININE: 2.26 mg/dL — AB (ref 0.61–1.24)
Chloride: 106 mmol/L (ref 101–111)
GFR calc Af Amer: 34 mL/min — ABNORMAL LOW (ref >60)
GFR calc non Af Amer: 29 mL/min — ABNORMAL LOW (ref >60)
GLUCOSE: 241 mg/dL — AB (ref 65–99)
Potassium: 4.2 mmol/L (ref 3.5–5.1)
Sodium: 139 mmol/L (ref 135–145)

## 2015-11-07 LAB — BRAIN NATRIURETIC PEPTIDE: B Natriuretic Peptide: 184.1 pg/mL — ABNORMAL HIGH (ref 0.0–100.0)

## 2015-11-07 MED ORDER — DOXAZOSIN MESYLATE 8 MG PO TABS: 8.0000 mg | ORAL_TABLET | Freq: Two times a day (BID) | ORAL | 6 refills | Status: DC

## 2015-11-07 MED ORDER — ATORVASTATIN CALCIUM 40 MG PO TABS: ORAL_TABLET | ORAL | 3 refills | Status: DC

## 2015-11-07 MED ORDER — CLONIDINE HCL 0.3 MG PO TABS
0.3000 mg | ORAL_TABLET | Freq: Once | ORAL | Status: DC
  Filled 2015-11-07: qty 1

## 2015-11-07 MED ORDER — AMLODIPINE BESYLATE 10 MG PO TABS: 10.0000 mg | ORAL_TABLET | Freq: Every day | ORAL | 3 refills | Status: DC

## 2015-11-07 NOTE — Progress Notes (Signed)
Heart Failure Psychosocial Assessment  Presenting Problem:  Patient presents to the clinic today for follow up. Patient is active with paramedicine and has issues with medication compliance.  Home Environment Type of housing: lives in an apartment in [place] I do you live with? Sister- [name]   Social Supports Marital status: never married Do you have children? none Who is your support system? [name]- sister who lives in [place]. Patient states she visits and assists with taking him to MD appointments although not available today.  [place] What is your source of income? SSi What is your insurance? Medicaid  Is it thru employer or other sources?  Medication coverage? Medicaid- $3 co-pays  Transportation How did you get to the clinic today? [facility] What are your needs?   Education Health Literacy/Understanding? Patient verbalizes basic understanding of heart failure symptoms although lacks follow thorugh.  Advanced Directives Living Will/HPOA? Discussed with patient but has no AD Goals of Care : discussed goals   Issues/Challenges raised by patient: Patient is seen by paramedicine and was noted to lack proper diet. Patient admits to eating high sodium diet and admits "it can mess you up". Patient had a pickle loaf sandwich for breakfast this morning. Patient states his sister does all the grocery shopping.  Assessment: Patient appears to have basic understanding although lacks ability to follow through with diet and medication management.   Plan: CSW discussed patient's sister coming to next clinic visit to discuss further diet options and healthy grocery list. CSW will also explore providing patient with medication minder clock to assist with med compliance. CSW will continue to follow for supportive intervention as well. Raquel Sarna, LCSW [phone]

## 2015-11-07 NOTE — Progress Notes (Signed)
Advanced Heart Failure Medication Review by a Pharmacist  Does the patient  feel that his/her medications are working for him/her?  yes  Has the patient been experiencing any side effects to the medications prescribed?  no  Does the patient measure his/her own blood pressure or blood glucose at home?  no   Does the patient have any problems obtaining medications due to transportation or finances?   no  Understanding of regimen: fair Understanding of indications: fair Potential of compliance: fair Patient understands to avoid NSAIDs. Patient understands to avoid decongestants.  Issues to address at subsequent visits: Compliance   Pharmacist comments:  Mr. Leonard [name] is a pleasant 65 yo M presenting with his medication bottles and [name] (paramedicine) who fills his pillbox weekly. He did not take his morning doses today because his pillbox had not been filled yet. He also admits to missing his evening dose on Saturday. He did not have any specific medication-related questions or concerns for me at this time.   Ruta Hinds. Velva Harman, PharmD, BCPS, CPP Clinical Pharmacist Pager: [phone] Phone: [phone] 11/07/2015 10:11 AM      Time with patient: 10 minutes Preparation and documentation time: 2 minutes Total time: 12 minutes

## 2015-11-07 NOTE — Patient Instructions (Addendum)
INCREASE Cardura (Doxazosin) to 8 mg twice daily.  Refilled Atorvastatin and Amlodipine for 90 day supply to [facility].  Routine lab work today. Will notify you of abnormal results, otherwise no news is good news!  Follow up 2 weeks with Oda Kilts, PA-C  Do the following things EVERYDAY: 1) Weigh yourself in the morning before breakfast. Write it down and keep it in a log. 2) Take your medicines as prescribed 3) Eat low salt foods-Limit salt (sodium) to 2000 mg per day.  4) Stay as active as you can everyday 5) Limit all fluids for the day to less than 2 liters

## 2015-11-07 NOTE — Progress Notes (Signed)
Advanced Heart Failure Clinic Note   PCP: Dr. Juanito Doom Cardiology: Dr. Aundra Dubin  65 yo with history of CAD s/p CABG, PAD, chronic diastolic CHF, CKD stage IV, and HTN presents for cardiology followup.  He has had multiple admissions this year for diastolic CHF.  Last echo in 5/17 showed EF 60-65% with grade II diastolic dysfunction.  He also has COPD with PFTs showing severe obstruction.  Last admission for CHF was in 7/17.    He presents presents for regular follow up. Down 8 lbs from our scale.  BP markedly elevated. Also feels like his vision is "wavy".  No headache. No weakness or confusion. Missed medicines Saturday night, and then all day Sunday.  No SOB on flat ground. Does occasionally miss his medicines throughout the week. Says he'll fall asleep on the couch and then wake up and go to bed without taking medications. Weight at home 217 lbs. Weight was up last week. SBPs 140-150s  Labs (8/17): K 3.9, creatinine 2.33, HCT 28.5 Labs (10/15/15: K 3.4, creatinine 2.28, HCT 29.8  PMH: 1. Chronic diastolic CHF: Multiple recent admissions.  Echo (5/17) with EF 60-65%, grade II diastolic dysfunction, normal RV size and systolic function.  2. PAD: Right femoral PCI in 12/14. Peripheral arterial dopplers in 6/17 with occluded right SFA.  He follows with [facility].  3. HTN: Renal artery dopplers negative in 5/17.  4. Hypothyroidism 5. CKD stage IV: Follows with Dr. Marval Regal. 6. CAD: s/p CABG x 3 in 3/15.  7. OSA: Mild, not on CPAP.  8. COPD: PFTs (3/15) with FVC 77%, FEV1 47%, ratio 59%, TLC 55% => severe obstruction.  Prior smoker.  9. 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 Hyperlipidemia 12. Anemia of renal disease  Social History   Social History  . Marital status: Single    Spouse name: N/A  . Number of children: N/A  . Years of education: N/A   Occupational History  . Not on file.   Social History Main Topics  . Smoking status: Former Smoker    Packs/day: 1.00    Years: 46.00    Types:  Cigarettes    Start date: 02/04/1967    Quit date: 02/03/2013  . Smokeless tobacco: Never Used     Comment: Doing well with quitting.  . Alcohol use No  . Drug use: No  . Sexual activity: Not Currently   Other Topics Concern  . Not on file   Social History Narrative   Lives with sister, [name] in [place].  Retired - former Sports coach.   Family History  Problem Relation Age of Onset  . Cancer Mother     lung cancer  . Hypertension Mother   . Cancer Sister     patient thinks it was uterine cancer  . Hypertension Sister   . Heart attack Sister   . Stroke Neg Hx    ROS: All systems reviewed and negative except as per HPI.   Current Outpatient Prescriptions  Medication Sig Dispense Refill  . amLODipine (NORVASC) 10 MG tablet Take 1 tablet (10 mg total) by mouth daily. 30 tablet 1  . aspirin 81 MG tablet TAKE ONE (1) TABLET BY MOUTH EVERY DAY 30 tablet 3  . atorvastatin (LIPITOR) 40 MG tablet TAKE 1 TABLET BY MOUTH EVERY DAY AT 6 PM 30 tablet 0  . carvedilol (COREG) 25 MG tablet Take 2 tablets (50 mg total) by mouth 2 (two) times daily with a meal. 120 tablet 3  . cloNIDine (CATAPRES)  0.3 MG tablet Take 1 tablet (0.3 mg total) by mouth 2 (two) times daily. 60 tablet 3  . doxazosin (CARDURA) 4 MG tablet Take 1 tablet (4 mg total) by mouth 2 (two) times daily. 60 tablet 6  . Fluticasone-Salmeterol (ADVAIR DISKUS) 250-50 MCG/DOSE AEPB INHALE 1 PUFF INTO THE LUNGS 2 TIMES DAILY 60 each 1  . hydrALAZINE (APRESOLINE) 100 MG tablet TAKE ONE TABLET BY MOUTH EVERY EIGHT HOURS 90 tablet 3  . insulin aspart (NOVOLOG FLEXPEN) 100 UNIT/ML FlexPen Inject 35 Units into the skin 3 (three) times daily with meals. 15 mL 0  . Insulin Glargine (LANTUS SOLOSTAR) 100 UNIT/ML Solostar Pen Inject 60 Units into the skin daily.    . isosorbide mononitrate (IMDUR) 60 MG 24 hr tablet TAKE ONE (1) TABLET BY MOUTH EVERY DAY 30 tablet 3  . levothyroxine (SYNTHROID, LEVOTHROID) 50 MCG tablet TAKE ONE (1)  TABLET BY MOUTH EVERY DAY BEFORE BREAKFAST 30 tablet 3  . pantoprazole (PROTONIX) 40 MG tablet TAKE 1 TABLET BY MOUTH DAILY 30 tablet 3  . potassium chloride SA (K-DUR,KLOR-CON) 20 MEQ tablet Take 1 tablet (20 mEq total) by mouth daily. 90 tablet 3  . PROVENTIL HFA 108 (90 Base) MCG/ACT inhaler INHALE 2 PUFFS INTO THE LUNGS EVERY 6 HOURS AS NEEDED FOR WHEEZING ORSHORTNESS OF BREATH 6.7 g 3  . SURE COMFORT PEN NEEDLES 31G X 8 MM MISC USE WITH BOTH INSULIN PENS TWICE DAILY (4 PER DAY) 100 each 5  . torsemide (DEMADEX) 20 MG tablet Take 2 tablets (40 mg total) by mouth 2 (two) times daily. 120 tablet 3  . nitroGLYCERIN (NITROSTAT) 0.4 MG SL tablet Place 1 tablet (0.4 mg total) under the tongue every 5 (five) minutes as needed for chest pain. (Patient not taking: Reported on 11/07/2015) 30 tablet 12   No current facility-administered medications for this encounter.      BP (!) 218/88 (BP Location: Right Arm, Patient Position: Sitting, Cuff Size: Normal)   Pulse 70   Ht 5\' 8"  (1.727 m)   Wt 221 lb 12.8 oz (100.6 kg)   SpO2 100%   BMI 33.72 kg/m    Wt Readings from Last 3 Encounters:  11/07/15 221 lb 12.8 oz (100.6 kg)  10/31/15 224 lb (101.6 kg)  10/24/15 229 lb 9.6 oz (104.1 kg)    General: NAD. Ambulated in clinic without difficulty. Appears fatigued.  HEENT: Normal.  Neck: JVP 8-9 cm,  no thyromegaly or thyroid nodule.  Lungs: Clear, normal effort CV: Nondisplaced PMI.  Heart regular S1/S2, no S3/S4, no murmur.  1+ ankle edema bilaterally.  No carotid bruit.  Unable to palpate pedal pulses.  Abdomen: Soft, NT, ND, no HSM. No bruits or masses. +BS  Skin: Intact without lesions or rashes.  Neurologic: Alert and oriented x 3. No neurological deficit. CN I-XII grossly intact. Equal grip strength.  Psych: Normal affect. Extremities: No clubbing or cyanosis. 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   EKG: NSR 71 bpm, LVH pattern  Assessment/Plan: 1. Chronic diastolic CHF:  Complicated by  CKD Stage IV. - Volume status improved, but remains mildly elevated with medical non-compliance.  Stressed compliance. Given his morning torsemide dose in clinic.  - NYHA class II-III symptoms.  He has had multiple admissions this year, large component of this is poor insight and non-compliance.   - Continue torsemide 40 mg BID. BMET and BNP today.  He misses doses 2-3 times a week.    - Stressed importance of medication compliance  and limiting fluid to <2 L each day. ( Currently drinking closer to 3-3.5 L) - Continue potassium 20 meq daily.  - Again reviewed dietary education. Eating deli meat and potato chips for multiple meals daily.  2. Chronic HTN with HTN Urgency.  - Elevated >220 in clinic in absence of his morning meds.  Rapidly improved with morning medications.  Major issue is non-compliance. Will work to get him a "reminder clock" for his medication timing in the evening.  - Continue clonidine 0.3 mg bid.   - Continue Coreg 50 mg BID, Hydralazine 100 mg TID, and Imdur 60 mg daily - Continue Amlodipine 10 mg daily.  - Continue doxazosin 4 mg BID.  - Pressures 140-150s at home while taking his medications, as low as 130s.  Paramedicine following.  3. CAD: S/p CABG.  No chest pain.  Continue ASA 81 mg and statin.   4. COPD: Severe by 3/15 PFTs.  - He continues to not smoke.  5. CKD Stage IV.  Sees Dr Marval Regal.  - Limits medication and diuresis.     6. PAD: Not particularly limited by claudication now.  Saw [facility] 6/17, has followup.   BP 0948 218/88 (prior to medications) Medication administered 1010 BP 1015 222/88 (immediately after medications) BP 1035 176/1  (likely erroneous) BP 1055 208/88 BP 1120 188/90 BP 1140 148/86  Pt BP improved greatly after morning dose of medications. Doxazosin increased as above.  Pt did NOT require second dose of clonidine. BMET/BNP today.  Continue paramedicine. Continue to follow closely.  He is at high risk for admission with poor insight into  his disease, medical non-compliance, and dietary indiscretion.   Follow up 2 weeks.   Satira Mccallum Levaughn Puccinelli  PA-C  11/07/2015   Total time spent > 40 minutes. Over half that time spent discussing the above, pt also administered his home medications in clinic with regular vitals as above while awaiting improvement in BP.

## 2015-11-08 ENCOUNTER — Other Ambulatory Visit (HOSPITAL_BASED_OUTPATIENT_CLINIC_OR_DEPARTMENT_OTHER): Payer: Medicaid Other

## 2015-11-08 ENCOUNTER — Ambulatory Visit (HOSPITAL_BASED_OUTPATIENT_CLINIC_OR_DEPARTMENT_OTHER): Payer: Medicaid Other

## 2015-11-08 VITALS — BP 106/50 | HR 68 | Temp 98.4°F | Resp 20

## 2015-11-08 DIAGNOSIS — D631 Anemia in chronic kidney disease: Secondary | ICD-10-CM

## 2015-11-08 DIAGNOSIS — N189 Chronic kidney disease, unspecified: Secondary | ICD-10-CM

## 2015-11-08 LAB — CBC & DIFF AND RETIC
BASO%: 0.2 % (ref 0.0–2.0)
BASOS ABS: 0 10*3/uL (ref 0.0–0.1)
EOS ABS: 0.2 10*3/uL (ref 0.0–0.5)
EOS%: 3.4 % (ref 0.0–7.0)
HEMATOCRIT: 27.3 % — AB (ref 38.4–49.9)
HEMOGLOBIN: 8.4 g/dL — AB (ref 13.0–17.1)
Immature Retic Fract: 1.8 % — ABNORMAL LOW (ref 3.00–10.60)
LYMPH#: 0.7 10*3/uL — AB (ref 0.9–3.3)
LYMPH%: 11.8 % — ABNORMAL LOW (ref 14.0–49.0)
MCH: 23.2 pg — ABNORMAL LOW (ref 27.2–33.4)
MCHC: 30.8 g/dL — ABNORMAL LOW (ref 32.0–36.0)
MCV: 75.4 fL — ABNORMAL LOW (ref 79.3–98.0)
MONO#: 0.5 10*3/uL (ref 0.1–0.9)
MONO%: 7.7 % (ref 0.0–14.0)
NEUT#: 4.8 10*3/uL (ref 1.5–6.5)
NEUT%: 76.9 % — AB (ref 39.0–75.0)
Platelets: 174 10*3/uL (ref 140–400)
RBC: 3.62 10*6/uL — ABNORMAL LOW (ref 4.20–5.82)
RDW: 19.8 % — AB (ref 11.0–14.6)
RETIC %: 0.69 % — AB (ref 0.80–1.80)
RETIC CT ABS: 24.98 10*3/uL — AB (ref 34.80–93.90)
WBC: 6.2 10*3/uL (ref 4.0–10.3)

## 2015-11-08 MED ORDER — DARBEPOETIN ALFA 500 MCG/ML IJ SOSY
500.0000 ug | PREFILLED_SYRINGE | Freq: Once | INTRAMUSCULAR | Status: AC
  Administered 2015-11-08: 500 ug via SUBCUTANEOUS
  Filled 2015-11-08: qty 1

## 2015-11-08 NOTE — Patient Instructions (Signed)
Darbepoetin Alfa injection What is this medicine? DARBEPOETIN ALFA (dar be POE e tin AL fa) helps your body make more red blood cells. It is used to treat anemia caused by chronic kidney failure and chemotherapy. This medicine may be used for other purposes; ask your health care provider or pharmacist if you have questions. What should I tell my health care provider before I take this medicine? They need to know if you have any of these conditions: -blood clotting disorders or history of blood clots -cancer patient not on chemotherapy -cystic fibrosis -heart disease, such as angina, heart failure, or a history of a heart attack -hemoglobin level of 12 g/dL or greater -high blood pressure -low levels of folate, iron, or vitamin B12 -seizures -an unusual or allergic reaction to darbepoetin, erythropoietin, albumin, hamster proteins, latex, other medicines, foods, dyes, or preservatives -pregnant or trying to get pregnant -breast-feeding How should I use this medicine? This medicine is for injection into a vein or under the skin. It is usually given by a health care professional in a hospital or clinic setting. If you get this medicine at home, you will be taught how to prepare and give this medicine. Do not shake the solution before you withdraw a dose. Use exactly as directed. Take your medicine at regular intervals. Do not take your medicine more often than directed. It is important that you put your used needles and syringes in a special sharps container. Do not put them in a trash can. If you do not have a sharps container, call your pharmacist or healthcare provider to get one. Talk to your pediatrician regarding the use of this medicine in children. While this medicine may be used in children as young as 1 year for selected conditions, precautions do apply. Overdosage: If you think you have taken too much of this medicine contact a poison control center or emergency room at once. NOTE:  This medicine is only for you. Do not share this medicine with others. What if I miss a dose? If you miss a dose, take it as soon as you can. If it is almost time for your next dose, take only that dose. Do not take double or extra doses. What may interact with this medicine? Do not take this medicine with any of the following medications: -epoetin alfa This list may not describe all possible interactions. Give your health care provider a list of all the medicines, herbs, non-prescription drugs, or dietary supplements you use. Also tell them if you smoke, drink alcohol, or use illegal drugs. Some items may interact with your medicine. What should I watch for while using this medicine? Visit your prescriber or health care professional for regular checks on your progress and for the needed blood tests and blood pressure measurements. It is especially important for the doctor to make sure your hemoglobin level is in the desired range, to limit the risk of potential side effects and to give you the best benefit. Keep all appointments for any recommended tests. Check your blood pressure as directed. Ask your doctor what your blood pressure should be and when you should contact him or her. As your body makes more red blood cells, you may need to take iron, folic acid, or vitamin B supplements. Ask your doctor or health care provider which products are right for you. If you have kidney disease continue dietary restrictions, even though this medication can make you feel better. Talk with your doctor or health care professional about the   foods you eat and the vitamins that you take. What side effects may I notice from receiving this medicine? Side effects that you should report to your doctor or health care professional as soon as possible: -allergic reactions like skin rash, itching or hives, swelling of the face, lips, or tongue -breathing problems -changes in vision -chest pain -confusion, trouble speaking  or understanding -feeling faint or lightheaded, falls -high blood pressure -muscle aches or pains -pain, swelling, warmth in the leg -rapid weight gain -severe headaches -sudden numbness or weakness of the face, arm or leg -trouble walking, dizziness, loss of balance or coordination -seizures (convulsions) -swelling of the ankles, feet, hands -unusually weak or tired Side effects that usually do not require medical attention (report to your doctor or health care professional if they continue or are bothersome): -diarrhea -fever, chills (flu-like symptoms) -headaches -nausea, vomiting -redness, stinging, or swelling at site where injected This list may not describe all possible side effects. Call your doctor for medical advice about side effects. You may report side effects to FDA at 1-800-FDA-1088. Where should I keep my medicine? Keep out of the reach of children. Store in a refrigerator between 2 and 8 degrees C (36 and 46 degrees F). Do not freeze. Do not shake. Throw away any unused portion if using a single-dose vial. Throw away any unused medicine after the expiration date. NOTE: This sheet is a summary. It may not cover all possible information. If you have questions about this medicine, talk to your doctor, pharmacist, or health care provider.    2016, [employer]. (2008-01-04 10:23:57)  

## 2015-11-09 ENCOUNTER — Ambulatory Visit: Payer: Medicaid Other | Admitting: Vascular Surgery

## 2015-11-09 ENCOUNTER — Encounter (HOSPITAL_COMMUNITY): Payer: Medicaid Other

## 2015-11-13 ENCOUNTER — Encounter: Payer: Self-pay | Admitting: Family Medicine

## 2015-11-13 DIAGNOSIS — E11319 Type 2 diabetes mellitus with unspecified diabetic retinopathy without macular edema: Secondary | ICD-10-CM

## 2015-11-13 HISTORY — DX: Type 2 diabetes mellitus with unspecified diabetic retinopathy without macular edema: E11.319

## 2015-11-16 ENCOUNTER — Encounter (HOSPITAL_COMMUNITY): Payer: Self-pay | Admitting: *Deleted

## 2015-11-16 ENCOUNTER — Other Ambulatory Visit: Payer: Self-pay | Admitting: Gastroenterology

## 2015-11-16 NOTE — Progress Notes (Signed)
Mr Leonard [name] said he has to find instructions for prep for colonoscopy.  I instructed patient take only 12/2 of Lantus on Sunday evening.   I instructed patient to call Endo Monday evening if he is unable to comple prep.

## 2015-11-19 ENCOUNTER — Encounter (HOSPITAL_COMMUNITY): Payer: Self-pay | Admitting: Anesthesiology

## 2015-11-19 ENCOUNTER — Ambulatory Visit (HOSPITAL_COMMUNITY): Admission: RE | Admit: 2015-11-19 | Payer: Medicaid Other | Source: Ambulatory Visit | Admitting: Gastroenterology

## 2015-11-19 HISTORY — DX: Atherosclerotic heart disease of native coronary artery without angina pectoris: I25.10

## 2015-11-19 HISTORY — DX: Malignant (primary) neoplasm, unspecified: C80.1

## 2015-11-19 SURGERY — ESOPHAGOGASTRODUODENOSCOPY (EGD) WITH PROPOFOL
Anesthesia: Monitor Anesthesia Care

## 2015-11-19 NOTE — Anesthesia Preprocedure Evaluation (Deleted)
Anesthesia Evaluation  Patient identified by MRN, date of birth, ID band Patient awake    Reviewed: Allergy & Precautions, NPO status , Patient's Chart, lab work & pertinent test results  Airway Mallampati: II  TM Distance: >3 FB Neck ROM: Full    Dental no notable dental hx.    Pulmonary neg pulmonary ROS, former smoker,    Pulmonary exam normal breath sounds clear to auscultation       Cardiovascular hypertension, + CABG, + Peripheral Vascular Disease and +CHF  Normal cardiovascular exam Rhythm:Regular Rate:Normal  Left ventricle: The cavity size was normal. Systolic function was   normal. The estimated ejection fraction was in the range of 60%   to 65%. Wall motion was normal; there were no regional wall   motion abnormalities. Features are consistent with a pseudonormal   left ventricular filling pattern, with concomitant abnormal   relaxation and increased filling pressure (grade 2 diastolic   dysfunction). Doppler parameters are consistent with high   ventricular filling pressure. - Aortic valve: Transvalvular velocity was within the normal range.   There was no stenosis. There was trivial regurgitation. - Mitral valve: Calcified annulus. There was no regurgitation. - Right ventricle: The cavity size was normal. Wall thickness was   normal. Systolic function was normal. - Atrial septum: No defect or patent foramen ovale was identified. - Tricuspid valve: There was trivial regurgitation. - Pulmonary arteries: Systolic pressure was within the normal   range. PA peak pressure: 24 mm Hg (S). - Inferior vena cava: The vessel was dilated. The respirophasic   diameter changes were in the normal range (>= 50%), consistent   with normal central venous pressure. - Pericardium, extracardiac: There was a left pleural effusion   Neuro/Psych negative neurological ROS  negative psych ROS   GI/Hepatic negative GI ROS, Neg liver ROS,    Endo/Other  negative endocrine ROSdiabetes  Renal/GU negative Renal ROS  negative genitourinary   Musculoskeletal negative musculoskeletal ROS (+)   Abdominal   Peds negative pediatric ROS (+)  Hematology negative hematology ROS (+)   Anesthesia Other Findings   Reproductive/Obstetrics negative OB ROS                             Anesthesia Physical Anesthesia Plan  ASA: III  Anesthesia Plan: MAC   Post-op Pain Management:    Induction: Intravenous  Airway Management Planned: Nasal Cannula  Additional Equipment:   Intra-op Plan:   Post-operative Plan:   Informed Consent: I have reviewed the patients History and Physical, chart, labs and discussed the procedure including the risks, benefits and alternatives for the proposed anesthesia with the patient or authorized representative who has indicated his/her understanding and acceptance.   Dental advisory given  Plan Discussed with: CRNA and Surgeon  Anesthesia Plan Comments:         Anesthesia Quick Evaluation

## 2015-11-20 ENCOUNTER — Other Ambulatory Visit: Payer: Self-pay | Admitting: Family Medicine

## 2015-11-21 ENCOUNTER — Ambulatory Visit (HOSPITAL_COMMUNITY)
Admission: RE | Admit: 2015-11-21 | Discharge: 2015-11-21 | Disposition: A | Discharge status: Home / Self Care | Payer: Medicaid Other | Source: Ambulatory Visit | Attending: Cardiology | Admitting: Cardiology

## 2015-11-21 ENCOUNTER — Encounter (HOSPITAL_COMMUNITY): Payer: Self-pay

## 2015-11-21 VITALS — BP 158/68 | HR 66 | Wt 217.1 lb

## 2015-11-21 DIAGNOSIS — Z7982 Long term (current) use of aspirin: Secondary | ICD-10-CM | POA: Insufficient documentation

## 2015-11-21 DIAGNOSIS — E039 Hypothyroidism, unspecified: Secondary | ICD-10-CM | POA: Diagnosis not present

## 2015-11-21 DIAGNOSIS — Z8249 Family history of ischemic heart disease and other diseases of the circulatory system: Secondary | ICD-10-CM | POA: Insufficient documentation

## 2015-11-21 DIAGNOSIS — J449 Chronic obstructive pulmonary disease, unspecified: Secondary | ICD-10-CM | POA: Diagnosis not present

## 2015-11-21 DIAGNOSIS — N184 Chronic kidney disease, stage 4 (severe): Secondary | ICD-10-CM | POA: Diagnosis not present

## 2015-11-21 DIAGNOSIS — I5022 Chronic systolic (congestive) heart failure: Secondary | ICD-10-CM

## 2015-11-21 DIAGNOSIS — E782 Mixed hyperlipidemia: Secondary | ICD-10-CM

## 2015-11-21 DIAGNOSIS — D472 Monoclonal gammopathy: Secondary | ICD-10-CM | POA: Insufficient documentation

## 2015-11-21 DIAGNOSIS — I130 Hypertensive heart and chronic kidney disease with heart failure and stage 1 through stage 4 chronic kidney disease, or unspecified chronic kidney disease: Secondary | ICD-10-CM | POA: Insufficient documentation

## 2015-11-21 DIAGNOSIS — E1122 Type 2 diabetes mellitus with diabetic chronic kidney disease: Secondary | ICD-10-CM | POA: Diagnosis not present

## 2015-11-21 DIAGNOSIS — I10 Essential (primary) hypertension: Secondary | ICD-10-CM

## 2015-11-21 DIAGNOSIS — Z794 Long term (current) use of insulin: Secondary | ICD-10-CM | POA: Insufficient documentation

## 2015-11-21 DIAGNOSIS — Z951 Presence of aortocoronary bypass graft: Secondary | ICD-10-CM

## 2015-11-21 DIAGNOSIS — Z87891 Personal history of nicotine dependence: Secondary | ICD-10-CM | POA: Diagnosis not present

## 2015-11-21 DIAGNOSIS — I5032 Chronic diastolic (congestive) heart failure: Secondary | ICD-10-CM | POA: Diagnosis present

## 2015-11-21 DIAGNOSIS — I739 Peripheral vascular disease, unspecified: Secondary | ICD-10-CM | POA: Diagnosis not present

## 2015-11-21 DIAGNOSIS — I1A0 Resistant hypertension: Secondary | ICD-10-CM

## 2015-11-21 DIAGNOSIS — Z7951 Long term (current) use of inhaled steroids: Secondary | ICD-10-CM | POA: Diagnosis not present

## 2015-11-21 DIAGNOSIS — E785 Hyperlipidemia, unspecified: Secondary | ICD-10-CM | POA: Diagnosis not present

## 2015-11-21 DIAGNOSIS — Z79899 Other long term (current) drug therapy: Secondary | ICD-10-CM | POA: Diagnosis not present

## 2015-11-21 DIAGNOSIS — I2510 Atherosclerotic heart disease of native coronary artery without angina pectoris: Secondary | ICD-10-CM | POA: Diagnosis not present

## 2015-11-21 DIAGNOSIS — G4733 Obstructive sleep apnea (adult) (pediatric): Secondary | ICD-10-CM | POA: Insufficient documentation

## 2015-11-21 LAB — BASIC METABOLIC PANEL
ANION GAP: 10 (ref 5–15)
BUN: 43 mg/dL — ABNORMAL HIGH (ref 6–20)
CO2: 25 mmol/L (ref 22–32)
Calcium: 8.9 mg/dL (ref 8.9–10.3)
Chloride: 104 mmol/L (ref 101–111)
Creatinine, Ser: 2.6 mg/dL — ABNORMAL HIGH (ref 0.61–1.24)
GFR calc Af Amer: 28 mL/min — ABNORMAL LOW (ref >60)
GFR, EST NON AFRICAN AMERICAN: 24 mL/min — AB (ref >60)
GLUCOSE: 270 mg/dL — AB (ref 65–99)
POTASSIUM: 4.1 mmol/L (ref 3.5–5.1)
Sodium: 139 mmol/L (ref 135–145)

## 2015-11-21 LAB — BRAIN NATRIURETIC PEPTIDE: B NATRIURETIC PEPTIDE 5: 81.7 pg/mL (ref 0.0–100.0)

## 2015-11-21 MED ORDER — POTASSIUM CHLORIDE CRYS ER 20 MEQ PO TBCR: EXTENDED_RELEASE_TABLET | ORAL | 3 refills | Status: DC

## 2015-11-21 MED ORDER — TORSEMIDE 20 MG PO TABS: ORAL_TABLET | ORAL | 3 refills | Status: DC

## 2015-11-21 NOTE — Patient Instructions (Addendum)
INCREASE Potassium to 20meq (1 tablet ) in the AM and 69meq (1/2 tablet) in the PM.  INCREASE Torsemide to 60mg  ( 3 tablets) in the AM and 40mg  ( 2 tablets )in the PM.  Routine lab work today. Will notify you of abnormal results  Follow up in 2 weeks.

## 2015-11-21 NOTE — Progress Notes (Signed)
Advanced Heart Failure Clinic Note   PCP: Dr. Juanito Doom Cardiology: Dr. Aundra Dubin  65 yo with history of CAD s/p CABG, PAD, chronic diastolic CHF, CKD stage IV, and HTN presents for cardiology followup.  He has had multiple admissions this year for diastolic CHF.  Last echo in 5/17 showed EF 60-65% with grade II diastolic dysfunction.  He also has COPD with PFTs showing severe obstruction.  Last admission for CHF was in 7/17.    He presents presents for regular follow up. Weight own 4 lbs from last viisit. SBP 130-150s at home.  No headache. Still feels like his vision gets "wavy" from time.  Says he saw an eye doctor recently, said the back of his eyes looked OK. Hasn't missed his medicines since he saw [place] in clinic last month. [name], with paramedicine states his BP was 160/80 at home yesterday.   Labs (8/17): K 3.9, creatinine 2.33, HCT 28.5 Labs (10/15/15: K 3.4, creatinine 2.28, HCT 29.8  PMH: 1. Chronic diastolic CHF: Multiple recent admissions.  Echo (5/17) with EF 60-65%, grade II diastolic dysfunction, normal RV size and systolic function.  2. PAD: Right femoral PCI in 12/14. Peripheral arterial dopplers in 6/17 with occluded right SFA.  He follows with [facility].  3. HTN: Renal artery dopplers negative in 5/17.  4. Hypothyroidism 5. CKD stage IV: Follows with Dr. Marval Regal. 6. CAD: s/p CABG x 3 in 3/15.  7. OSA: Mild, not on CPAP.  8. COPD: PFTs (3/15) with FVC 77%, FEV1 47%, ratio 59%, TLC 55% => severe obstruction.  Prior smoker.  9. 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 Hyperlipidemia 12. Anemia of renal disease  Social History   Social History  . Marital status: Single    Spouse name: N/A  . Number of children: N/A  . Years of education: N/A   Occupational History  . Not on file.   Social History Main Topics  . Smoking status: Former Smoker    Packs/day: 1.00    Years: 46.00    Types: Cigarettes    Start date: 02/04/1967    Quit date: 02/03/2013  . Smokeless tobacco: Never Used       Comment: Doing well with quitting.  . Alcohol use No  . Drug use: No  . Sexual activity: Not Currently   Other Topics Concern  . Not on file   Social History Narrative   Lives with sister, [name] in [place].  Retired - former Sports coach.   Family History  Problem Relation Age of Onset  . Cancer Mother     lung cancer  . Hypertension Mother   . Cancer Sister     patient thinks it was uterine cancer  . Hypertension Sister   . Heart attack Sister   . Stroke Neg Hx    ROS: All systems reviewed and negative except as per HPI.   Current Outpatient Prescriptions  Medication Sig Dispense Refill  . amLODipine (NORVASC) 10 MG tablet Take 1 tablet (10 mg total) by mouth daily. 90 tablet 3  . aspirin 81 MG tablet TAKE ONE (1) TABLET BY MOUTH EVERY DAY 30 tablet 3  . atorvastatin (LIPITOR) 40 MG tablet TAKE 1 TABLET BY MOUTH EVERY DAY AT 6 PM 90 tablet 3  . carvedilol (COREG) 25 MG tablet Take 2 tablets (50 mg total) by mouth 2 (two) times daily with a meal. 120 tablet 3  . cloNIDine (CATAPRES) 0.3 MG tablet Take 1 tablet (0.3 mg total) by mouth 2 (two)  times daily. 60 tablet 3  . doxazosin (CARDURA) 8 MG tablet Take 1 tablet (8 mg total) by mouth 2 (two) times daily. 60 tablet 6  . hydrALAZINE (APRESOLINE) 100 MG tablet TAKE ONE TABLET BY MOUTH EVERY EIGHT HOURS 90 tablet 3  . insulin aspart (NOVOLOG FLEXPEN) 100 UNIT/ML FlexPen Inject 35 Units into the skin 3 (three) times daily with meals. 15 mL 0  . Insulin Glargine (LANTUS SOLOSTAR) 100 UNIT/ML Solostar Pen Inject 60 Units into the skin daily.    . isosorbide mononitrate (IMDUR) 60 MG 24 hr tablet TAKE ONE (1) TABLET BY MOUTH EVERY DAY 30 tablet 3  . levothyroxine (SYNTHROID, LEVOTHROID) 50 MCG tablet TAKE ONE (1) TABLET BY MOUTH EVERY DAY BEFORE BREAKFAST 30 tablet 3  . pantoprazole (PROTONIX) 40 MG tablet TAKE 1 TABLET BY MOUTH DAILY 30 tablet 3  . potassium chloride SA (K-DUR,KLOR-CON) 20 MEQ tablet Take 1 tablet (20  mEq total) by mouth daily. 90 tablet 3  . PROVENTIL HFA 108 (90 Base) MCG/ACT inhaler INHALE 2 PUFFS INTO THE LUNGS EVERY 6 HOURS AS NEEDED FOR WHEEZING ORSHORTNESS OF BREATH 6.7 g 3  . SURE COMFORT PEN NEEDLES 31G X 8 MM MISC USE WITH BOTH INSULIN PENS TWICE DAILY (4 PER DAY) 100 each 5  . torsemide (DEMADEX) 20 MG tablet Take 2 tablets (40 mg total) by mouth 2 (two) times daily. 120 tablet 3  . Fluticasone-Salmeterol (ADVAIR DISKUS) 250-50 MCG/DOSE AEPB INHALE 1 PUFF INTO THE LUNGS 2 TIMES DAILY (Patient not taking: Reported on 11/21/2015) 60 each 1  . nitroGLYCERIN (NITROSTAT) 0.4 MG SL tablet Place 1 tablet (0.4 mg total) under the tongue every 5 (five) minutes as needed for chest pain. (Patient not taking: Reported on 11/21/2015) 30 tablet 12   No current facility-administered medications for this encounter.      BP (!) 158/68   Pulse 66   Wt 217 lb 2 oz (98.5 kg)   SpO2 99%   BMI 33.01 kg/m    Wt Readings from Last 3 Encounters:  11/21/15 217 lb 2 oz (98.5 kg)  11/07/15 221 lb 12.8 oz (100.6 kg)  10/31/15 224 lb (101.6 kg)    General: NAD. Ambulated in clinic without difficulty. Appears fatigued.  HEENT: Normal.  Neck: JVP 8-9 cm  no thyromegaly or thyroid nodule.  Lungs: CTAB, normal effort CV: Nondisplaced PMI.  Heart regular S1/S2, no S3/S4, no murmur.  1-2+ ankle edema bilaterally.  No carotid bruit.  Unable to palpate pedal pulses.  Abdomen: Soft, NT, ND, no HSM. No bruits or masses. +BS  Skin: Intact without lesions or rashes.  Neurologic: Alert and oriented x 3. No neurological deficit. CN I-XII grossly intact. Equal grip strength.  Psych: Normal affect. Extremities: No clubbing or cyanosis. 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   Assessment/Plan: 1. Chronic diastolic CHF:  Complicated by CKD Stage IV. - Volume status continues to improve, but remains slightly elevated with dietary indiscretion. - NYHA class III symptoms.  He has had multiple admissions this year,  large component of this is poor insight and non-compliance.   - Increase torsemide to 60 mg q am and 40 mg q pm. BMET and BNP today.  Encouraged compression hose.  - Increase potassium to 20 meq in am and 10 meq in pm.  - Continues to drink > 2 L daily. Stressed importance.  - Again reviewed dietary education. Had some type of [place] sausage for breakfast.   2. Chronic HTN  -  Control improved though remains elevated at time.  Continues to eat salty meals. Encouraged to monitor sodium.  - Continue clonidine 0.3 mg bid.   - Continue Coreg 50 mg BID, Hydralazine 100 mg TID, and Imdur 60 mg daily - Continue Amlodipine 10 mg daily.  - Continue doxazosin 8 mg BID.  - Pressures 130-150s at home. Will continue current regimen for now with increase in torsemide.  Paramedicine following.  3. CAD: S/p CABG.  No chest pain.  Continue ASA 81 mg and statin.   4. COPD: Severe by 3/15 PFTs.  - remains abstinent from cigarettes 5. CKD Stage IV.  Sees Dr Marval Regal.  - Limits medication and diuresis.     - BMET today.  6. PAD: Not particularly limited by claudication now.  Saw [facility] 6/17, has followup.   Increase torsemide as above. BMET/BNP today.   Satira Mccallum Tillery  PA-C  11/21/2015   Total time spent > 25 minutes. Over half that spend discussing the above.

## 2015-11-21 NOTE — Progress Notes (Signed)
Advanced Heart Failure Medication Review by a Pharmacist  Does the patient  feel that his/her medications are working for him/her?  yes  Has the patient been experiencing any side effects to the medications prescribed?  no  Does the patient measure his/her own blood pressure or blood glucose at home?  yes   Does the patient have any problems obtaining medications due to transportation or finances?   no  Understanding of regimen: good Understanding of indications: good Potential of compliance: good Patient understands to avoid NSAIDs. Patient understands to avoid decongestants.  Issues to address at subsequent visits: None   Pharmacist comments:  Leonard [name] is a pleasant 65 yo M presenting with his medication bottles. [name] (paramedicine) fills his pillbox weekly and he reports good compliance with his regimen. He did not have any specific medication-related questions or concerns for me at this time.   Ruta Hinds. Velva Harman, PharmD, BCPS, CPP Clinical Pharmacist Pager: [phone] Phone: [phone] 11/21/2015 10:25 AM      Time with patient: 10 minutes Preparation and documentation time: 2 minutes Total time: 12 minutes

## 2015-11-22 ENCOUNTER — Telehealth (HOSPITAL_COMMUNITY): Payer: Self-pay | Admitting: Cardiology

## 2015-11-22 ENCOUNTER — Ambulatory Visit (HOSPITAL_BASED_OUTPATIENT_CLINIC_OR_DEPARTMENT_OTHER): Payer: Medicaid Other | Admitting: Hematology and Oncology

## 2015-11-22 ENCOUNTER — Other Ambulatory Visit (HOSPITAL_BASED_OUTPATIENT_CLINIC_OR_DEPARTMENT_OTHER): Payer: Medicaid Other

## 2015-11-22 ENCOUNTER — Ambulatory Visit (HOSPITAL_BASED_OUTPATIENT_CLINIC_OR_DEPARTMENT_OTHER): Payer: Medicaid Other

## 2015-11-22 ENCOUNTER — Encounter: Payer: Self-pay | Admitting: Hematology and Oncology

## 2015-11-22 VITALS — BP 136/57 | HR 69 | Temp 98.0°F | Resp 18 | Ht 69.0 in | Wt 220.1 lb

## 2015-11-22 DIAGNOSIS — N184 Chronic kidney disease, stage 4 (severe): Secondary | ICD-10-CM

## 2015-11-22 DIAGNOSIS — D631 Anemia in chronic kidney disease: Secondary | ICD-10-CM | POA: Diagnosis not present

## 2015-11-22 DIAGNOSIS — N189 Chronic kidney disease, unspecified: Principal | ICD-10-CM

## 2015-11-22 DIAGNOSIS — D472 Monoclonal gammopathy: Secondary | ICD-10-CM

## 2015-11-22 DIAGNOSIS — Z23 Encounter for immunization: Secondary | ICD-10-CM | POA: Diagnosis not present

## 2015-11-22 DIAGNOSIS — I110 Hypertensive heart disease with heart failure: Secondary | ICD-10-CM

## 2015-11-22 LAB — CBC & DIFF AND RETIC
BASO%: 0.5 % (ref 0.0–2.0)
Basophils Absolute: 0 10*3/uL (ref 0.0–0.1)
EOS ABS: 0.3 10*3/uL (ref 0.0–0.5)
EOS%: 4.2 % (ref 0.0–7.0)
HCT: 26.3 % — ABNORMAL LOW (ref 38.4–49.9)
HEMOGLOBIN: 8.2 g/dL — AB (ref 13.0–17.1)
IMMATURE RETIC FRACT: 5.9 % (ref 3.00–10.60)
LYMPH#: 0.9 10*3/uL (ref 0.9–3.3)
LYMPH%: 15.3 % (ref 14.0–49.0)
MCH: 23.3 pg — ABNORMAL LOW (ref 27.2–33.4)
MCHC: 31.2 g/dL — ABNORMAL LOW (ref 32.0–36.0)
MCV: 74.7 fL — ABNORMAL LOW (ref 79.3–98.0)
MONO#: 0.4 10*3/uL (ref 0.1–0.9)
MONO%: 6.8 % (ref 0.0–14.0)
NEUT%: 73.2 % (ref 39.0–75.0)
NEUTROS ABS: 4.4 10*3/uL (ref 1.5–6.5)
Platelets: 196 10*3/uL (ref 140–400)
RBC: 3.52 10*6/uL — AB (ref 4.20–5.82)
RDW: 19.3 % — AB (ref 11.0–14.6)
RETIC %: 1.63 % (ref 0.80–1.80)
Retic Ct Abs: 57.38 10*3/uL (ref 34.80–93.90)
WBC: 6 10*3/uL (ref 4.0–10.3)

## 2015-11-22 MED ORDER — DARBEPOETIN ALFA 500 MCG/ML IJ SOSY
500.0000 ug | PREFILLED_SYRINGE | Freq: Once | INTRAMUSCULAR | Status: AC
  Administered 2015-11-22: 500 ug via SUBCUTANEOUS
  Filled 2015-11-22: qty 1

## 2015-11-22 MED ORDER — INFLUENZA VAC SPLIT QUAD 0.5 ML IM SUSY
0.5000 mL | PREFILLED_SYRINGE | Freq: Once | INTRAMUSCULAR | Status: AC
  Administered 2015-11-22: 0.5 mL via INTRAMUSCULAR
  Filled 2015-11-22: qty 0.5

## 2015-11-22 NOTE — Telephone Encounter (Signed)
-----   Message from Shirley Friar, PA-C sent at 11/22/2015  8:29 AM EDT ----- Please follow up today. Really does need labs Monday. Are we opening up Afternoon labs Monday?   Legrand Como [address]" [place], PA-C 11/22/2015 8:28 AM

## 2015-11-22 NOTE — Progress Notes (Signed)
[facility] NOTE  Leonard Dolly, MD SUMMARY OF HEMATOLOGIC HISTORY:  Leonard [name] is here because of progressive anemia, renal failure and MGUS. I reviewed reports from his nephrologist office. The patient have significant peripheral vascular disease, diabetes and hypertension. Early this year, he had baseline creatinine of around 1.3 and subsequently developed heart failure. He was placed on aggressive medication/regimen and subsequently was noted to have progressive renal failure and anemia. According to his blood work dated 01/03/2015, white blood cell count is at 6.6, hemoglobin 7.1, platelet count of 153, creatinine of 2.4, calcium of 8.9, MCV 76, iron saturation 15%, ferritin 180, serum vitamin B-12 of 360. Serum protein electrophoresis initially detected M spike but subsequently not quantifiable. Urine immunofixation showed Bence-Jones Proteinuria. Echocardiogram show preserved ejection fraction 55-60% but with evidence of diastolic heart failure. He denies history of abnormal bone pain or bone fracture. Patient denies history of recurrent infection or atypical infections such as shingles of meningitis. Denies chills, night sweats, anorexia or abnormal weight loss. He complained of fatigue and shortness of breath on minimal exertion. The patient denies any recent signs or symptoms of bleeding such as spontaneous hematuria or hematochezia. He has occasional epistaxis when he blows his nose. He does not have spontaneous epistaxis Evaluation in December 2016 show he had anemia chronic disease and MGUS  On 02/01/2015, he is started on Aranesp, 200 g every 2 weeks to keep hemoglobin greater than 11 Between December to June 2017, the dose of Aranesp is titrated to maximum 500 g INTERVAL HISTORY: Leonard [name] 65 y.o. male returns for follow-up. He feels fine. Denies recent chest pain or shortness of breath. No recent infection The patient  denies any recent signs or symptoms of bleeding such as spontaneous epistaxis, hematuria or hematochezia.   I have reviewed the past medical history, past surgical history, social history and family history with the patient and they are unchanged from previous note.  ALLERGIES:  has No Known Allergies.  MEDICATIONS:  Current Outpatient Prescriptions  Medication Sig Dispense Refill  . ADVAIR DISKUS 250-50 MCG/DOSE AEPB INHALE ONE PUFF INTO THE LUNGS TWO TIMESDAILY 60 each 3  . amLODipine (NORVASC) 10 MG tablet Take 1 tablet (10 mg total) by mouth daily. 90 tablet 3  . aspirin 81 MG tablet TAKE ONE (1) TABLET BY MOUTH EVERY DAY 30 tablet 3  . atorvastatin (LIPITOR) 40 MG tablet TAKE 1 TABLET BY MOUTH EVERY DAY AT 6 PM 90 tablet 3  . carvedilol (COREG) 25 MG tablet Take 2 tablets (50 mg total) by mouth 2 (two) times daily with a meal. 120 tablet 3  . cloNIDine (CATAPRES) 0.3 MG tablet Take 1 tablet (0.3 mg total) by mouth 2 (two) times daily. 60 tablet 3  . doxazosin (CARDURA) 8 MG tablet Take 1 tablet (8 mg total) by mouth 2 (two) times daily. 60 tablet 6  . hydrALAZINE (APRESOLINE) 100 MG tablet TAKE ONE TABLET BY MOUTH EVERY EIGHT HOURS 90 tablet 3  . insulin aspart (NOVOLOG FLEXPEN) 100 UNIT/ML FlexPen Inject 35 Units into the skin 3 (three) times daily with meals. 15 mL 0  . Insulin Glargine (LANTUS SOLOSTAR) 100 UNIT/ML Solostar Pen Inject 60 Units into the skin daily.    . isosorbide mononitrate (IMDUR) 60 MG 24 hr tablet TAKE ONE (1) TABLET BY MOUTH EVERY DAY 30 tablet 3  . levothyroxine (SYNTHROID, LEVOTHROID) 50 MCG tablet TAKE ONE (1) TABLET BY MOUTH EVERY DAY BEFORE BREAKFAST 30 tablet  3  . nitroGLYCERIN (NITROSTAT) 0.4 MG SL tablet Place 1 tablet (0.4 mg total) under the tongue every 5 (five) minutes as needed for chest pain. (Patient not taking: Reported on 11/21/2015) 30 tablet 12  . pantoprazole (PROTONIX) 40 MG tablet TAKE 1 TABLET BY MOUTH DAILY 30 tablet 3  . potassium  chloride SA (K-DUR,KLOR-CON) 20 MEQ tablet Take 89mq (1 tablet ) in the AM and 114m (1/2 tablet) in the PM 135 tablet 3  . PROVENTIL HFA 108 (90 Base) MCG/ACT inhaler INHALE 2 PUFFS INTO THE LUNGS EVERY 6 HOURS AS NEEDED FOR WHEEZING ORSHORTNESS OF BREATH 6.7 g 3  . SURE COMFORT PEN NEEDLES 31G X 8 MM MISC USE WITH BOTH INSULIN PENS TWICE DAILY (4 PER DAY) 100 each 5  . torsemide (DEMADEX) 20 MG tablet Take 6058m 3 tablets) in the AM and 47m54m2 tablets )in the PM 150 tablet 3   No current facility-administered medications for this visit.      REVIEW OF SYSTEMS:   Constitutional: Denies fevers, chills or night sweats Eyes: Denies blurriness of vision Ears, nose, mouth, throat, and face: Denies mucositis or sore throat Respiratory: Denies cough, dyspnea or wheezes Cardiovascular: Denies palpitation, chest discomfort or lower extremity swelling Gastrointestinal:  Denies nausea, heartburn or change in bowel habits Skin: Denies abnormal skin rashes Lymphatics: Denies new lymphadenopathy or easy bruising Neurological:Denies numbness, tingling or new weaknesses Behavioral/Psych: Mood is stable, no new changes  All other systems were reviewed with the patient and are negative.  PHYSICAL EXAMINATION: ECOG PERFORMANCE STATUS: 1 - Symptomatic but completely ambulatory  Vitals:   11/22/15 1107  BP: (!) 136/57  Pulse: 69  Resp: 18  Temp: 98 F (36.7 C)   Filed Weights   11/22/15 1107  Weight: 220 lb 1.6 oz (99.8 kg)    GENERAL:alert, no distress and comfortable SKIN: skin color, texture, turgor are normal, no rashes or significant lesions EYES: normal, Conjunctiva are pink and non-injected, sclera clear OROPHARYNX:no exudate, no erythema and lips, buccal mucosa, and tongue normal  NECK: supple, thyroid normal size, non-tender, without nodularity LYMPH:  no palpable lymphadenopathy in the cervical, axillary or inguinal LUNGS: clear to auscultation and percussion with normal  breathing effort HEART: regular rate & rhythm and no murmurs With mild bilateral lower extremity edema ABDOMEN:abdomen soft, non-tender and normal bowel sounds Musculoskeletal:no cyanosis of digits and no clubbing  NEURO: alert & oriented x 3 with fluent speech, no focal motor/sensory deficits  LABORATORY DATA:  I have reviewed the data as listed     Component Value Date/Time   NA 139 11/21/2015 1115   NA 140 08/16/2015 1554   K 4.1 11/21/2015 1115   K 3.8 08/16/2015 1554   CL 104 11/21/2015 1115   CO2 25 11/21/2015 1115   CO2 21 (L) 08/16/2015 1554   GLUCOSE 270 (H) 11/21/2015 1115   GLUCOSE 133 08/16/2015 1554   BUN 43 (H) 11/21/2015 1115   BUN 51.0 (H) 08/16/2015 1554   CREATININE 2.60 (H) 11/21/2015 1115   CREATININE 2.33 (H) 09/25/2015 1502   CREATININE 2.7 (H) 08/16/2015 1554   CALCIUM 8.9 11/21/2015 1115   CALCIUM NOPLAS 10/24/2015 1415   CALCIUM 8.9 08/16/2015 1554   PROT 6.7 10/24/2015 1428   PROT 7.0 08/16/2015 1554   ALBUMIN 3.4 (L) 10/24/2015 1428   ALBUMIN 3.4 (L) 08/16/2015 1554   AST 16 10/24/2015 1428   AST 17 08/16/2015 1554   ALT 13 (L) 10/24/2015 1428   ALT  13 08/16/2015 1554   ALKPHOS 99 10/24/2015 1428   ALKPHOS 89 08/16/2015 1554   BILITOT 0.3 10/24/2015 1428   BILITOT 0.30 08/16/2015 1554   GFRNONAA 24 (L) 11/21/2015 1115   GFRNONAA 27 (L) 07/20/2015 0949   GFRAA 28 (L) 11/21/2015 1115   GFRAA 31 (L) 07/20/2015 0949    No results found for: SPEP, UPEP  Lab Results  Component Value Date   WBC 6.0 11/22/2015   NEUTROABS 4.4 11/22/2015   HGB 8.2 (L) 11/22/2015   HCT 26.3 (L) 11/22/2015   MCV 74.7 (L) 11/22/2015   PLT 196 11/22/2015      Chemistry      Component Value Date/Time   NA 139 11/21/2015 1115   NA 140 08/16/2015 1554   K 4.1 11/21/2015 1115   K 3.8 08/16/2015 1554   CL 104 11/21/2015 1115   CO2 25 11/21/2015 1115   CO2 21 (L) 08/16/2015 1554   BUN 43 (H) 11/21/2015 1115   BUN 51.0 (H) 08/16/2015 1554   CREATININE 2.60  (H) 11/21/2015 1115   CREATININE 2.33 (H) 09/25/2015 1502   CREATININE 2.7 (H) 08/16/2015 1554      Component Value Date/Time   CALCIUM 8.9 11/21/2015 1115   CALCIUM NOPLAS 10/24/2015 1415   CALCIUM 8.9 08/16/2015 1554   ALKPHOS 99 10/24/2015 1428   ALKPHOS 89 08/16/2015 1554   AST 16 10/24/2015 1428   AST 17 08/16/2015 1554   ALT 13 (L) 10/24/2015 1428   ALT 13 08/16/2015 1554   BILITOT 0.3 10/24/2015 1428   BILITOT 0.30 08/16/2015 1554      ASSESSMENT & PLAN:  MGUS (monoclonal gammopathy of unknown significance)  The patient was originally referred here because of detectable M spike in his blood which subsequently become undetectable. 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 Extensive workup in the past few months did not take up any abnormal M spike. Although his serum light chains were high, this is likely related to renal disease and the ratio was just barely above the upper limit of normal. Repeat myeloma panel in July again is consistent with the pattern related to chronic renal failure In any case, I do not believe his renal failure and anemia is related to multiple myeloma.  Anemia in chronic kidney disease The patient has a lack of reticulocytosis with relatively poor response with erythropoietin production. He has received IV iron several months ago. I plan to recheck iron studies again next month  CKD (chronic kidney disease) stage 4, GFR 15-29 ml/min (HCC) The patient has significant fluctuation of his serum creatinine, between chronic kidney disease stage III to stage IV. In the meantime, he will continue to come back here minimum twice a month for close blood count monitoring, ESA and transfusion as needed He will continue close nephrology follow-up   No orders of the defined types were placed in this encounter.   All questions were answered. The patient knows to call the clinic with any problems, questions or concerns. No barriers to  learning was detected.  I spent 15 minutes counseling the patient face to face. The total time spent in the appointment was 20 minutes and more than 50% was on counseling.     Heath Lark, MD 10/19/201712:38 PM

## 2015-11-22 NOTE — Patient Instructions (Signed)
Darbepoetin Alfa injection What is this medicine? DARBEPOETIN ALFA (dar be POE e tin AL fa) helps your body make more red blood cells. It is used to treat anemia caused by chronic kidney failure and chemotherapy. This medicine may be used for other purposes; ask your health care provider or pharmacist if you have questions. What should I tell my health care provider before I take this medicine? They need to know if you have any of these conditions: -blood clotting disorders or history of blood clots -cancer patient not on chemotherapy -cystic fibrosis -heart disease, such as angina, heart failure, or a history of a heart attack -hemoglobin level of 12 g/dL or greater -high blood pressure -low levels of folate, iron, or vitamin B12 -seizures -an unusual or allergic reaction to darbepoetin, erythropoietin, albumin, hamster proteins, latex, other medicines, foods, dyes, or preservatives -pregnant or trying to get pregnant -breast-feeding How should I use this medicine? This medicine is for injection into a vein or under the skin. It is usually given by a health care professional in a hospital or clinic setting. If you get this medicine at home, you will be taught how to prepare and give this medicine. Do not shake the solution before you withdraw a dose. Use exactly as directed. Take your medicine at regular intervals. Do not take your medicine more often than directed. It is important that you put your used needles and syringes in a special sharps container. Do not put them in a trash can. If you do not have a sharps container, call your pharmacist or healthcare provider to get one. Talk to your pediatrician regarding the use of this medicine in children. While this medicine may be used in children as young as 1 year for selected conditions, precautions do apply. Overdosage: If you think you have taken too much of this medicine contact a poison control center or emergency room at once. NOTE:  This medicine is only for you. Do not share this medicine with others. What if I miss a dose? If you miss a dose, take it as soon as you can. If it is almost time for your next dose, take only that dose. Do not take double or extra doses. What may interact with this medicine? Do not take this medicine with any of the following medications: -epoetin alfa This list may not describe all possible interactions. Give your health care provider a list of all the medicines, herbs, non-prescription drugs, or dietary supplements you use. Also tell them if you smoke, drink alcohol, or use illegal drugs. Some items may interact with your medicine. What should I watch for while using this medicine? Visit your prescriber or health care professional for regular checks on your progress and for the needed blood tests and blood pressure measurements. It is especially important for the doctor to make sure your hemoglobin level is in the desired range, to limit the risk of potential side effects and to give you the best benefit. Keep all appointments for any recommended tests. Check your blood pressure as directed. Ask your doctor what your blood pressure should be and when you should contact him or her. As your body makes more red blood cells, you may need to take iron, folic acid, or vitamin B supplements. Ask your doctor or health care provider which products are right for you. If you have kidney disease continue dietary restrictions, even though this medication can make you feel better. Talk with your doctor or health care professional about the   foods you eat and the vitamins that you take. What side effects may I notice from receiving this medicine? Side effects that you should report to your doctor or health care professional as soon as possible: -allergic reactions like skin rash, itching or hives, swelling of the face, lips, or tongue -breathing problems -changes in vision -chest pain -confusion, trouble speaking  or understanding -feeling faint or lightheaded, falls -high blood pressure -muscle aches or pains -pain, swelling, warmth in the leg -rapid weight gain -severe headaches -sudden numbness or weakness of the face, arm or leg -trouble walking, dizziness, loss of balance or coordination -seizures (convulsions) -swelling of the ankles, feet, hands -unusually weak or tired Side effects that usually do not require medical attention (report to your doctor or health care professional if they continue or are bothersome): -diarrhea -fever, chills (flu-like symptoms) -headaches -nausea, vomiting -redness, stinging, or swelling at site where injected This list may not describe all possible side effects. Call your doctor for medical advice about side effects. You may report side effects to FDA at 1-800-FDA-1088. Where should I keep my medicine? Keep out of the reach of children. Store in a refrigerator between 2 and 8 degrees C (36 and 46 degrees F). Do not freeze. Do not shake. Throw away any unused portion if using a single-dose vial. Throw away any unused medicine after the expiration date. NOTE: This sheet is a summary. It may not cover all possible information. If you have questions about this medicine, talk to your doctor, pharmacist, or health care provider.    2016, [employer]. (2008-01-04 10:23:57)  

## 2015-11-22 NOTE — Assessment & Plan Note (Signed)
The patient has a lack of reticulocytosis with relatively poor response with erythropoietin production. He has received IV iron several months ago. I plan to recheck iron studies again next month

## 2015-11-22 NOTE — Assessment & Plan Note (Signed)
The patient was originally referred here because of detectable M spike in his blood which subsequently become undetectable. 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 Extensive workup in the past few months did not take up any abnormal M spike. Although his serum light chains were high, this is likely related to renal disease and the ratio was just barely above the upper limit of normal. Repeat myeloma panel in July again is consistent with the pattern related to chronic renal failure In any case, I do not believe his renal failure and anemia is related to multiple myeloma.

## 2015-11-22 NOTE — Assessment & Plan Note (Signed)
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 In the meantime, he will continue to come back here minimum twice a month for close blood count monitoring, ESA and transfusion as needed He will continue close nephrology follow-up

## 2015-11-22 NOTE — Telephone Encounter (Signed)
Pt aware will repeat labs 10/23

## 2015-11-26 ENCOUNTER — Encounter: Payer: Self-pay | Admitting: Vascular Surgery

## 2015-11-26 ENCOUNTER — Ambulatory Visit (HOSPITAL_COMMUNITY)
Admission: RE | Admit: 2015-11-26 | Discharge: 2015-11-26 | Disposition: A | Discharge status: Home / Self Care | Payer: Medicaid Other | Source: Ambulatory Visit | Attending: Cardiology | Admitting: Cardiology

## 2015-11-26 ENCOUNTER — Telehealth (HOSPITAL_COMMUNITY): Payer: Self-pay | Admitting: Cardiology

## 2015-11-26 DIAGNOSIS — I509 Heart failure, unspecified: Secondary | ICD-10-CM | POA: Diagnosis not present

## 2015-11-26 DIAGNOSIS — I110 Hypertensive heart disease with heart failure: Secondary | ICD-10-CM | POA: Diagnosis not present

## 2015-11-26 DIAGNOSIS — I5023 Acute on chronic systolic (congestive) heart failure: Secondary | ICD-10-CM

## 2015-11-26 LAB — BASIC METABOLIC PANEL
Anion gap: 11 (ref 5–15)
BUN: 49 mg/dL — AB (ref 6–20)
CALCIUM: 8.8 mg/dL — AB (ref 8.9–10.3)
CO2: 24 mmol/L (ref 22–32)
CREATININE: 3.05 mg/dL — AB (ref 0.61–1.24)
Chloride: 102 mmol/L (ref 101–111)
GFR calc Af Amer: 23 mL/min — ABNORMAL LOW (ref >60)
GFR, EST NON AFRICAN AMERICAN: 20 mL/min — AB (ref >60)
GLUCOSE: 395 mg/dL — AB (ref 65–99)
Potassium: 4.3 mmol/L (ref 3.5–5.1)
Sodium: 137 mmol/L (ref 135–145)

## 2015-11-26 MED ORDER — TORSEMIDE 20 MG PO TABS: 40.0000 mg | ORAL_TABLET | Freq: Two times a day (BID) | ORAL | 3 refills | Status: DC

## 2015-11-26 NOTE — Progress Notes (Signed)
Established Critical Limb Ischemia Patient  History of Present Illness  Leonard [name] is a 65 y.o. (1950-12-12) male who presents with chief complaint: no complaints.    The patient has previously undergone:  1.  OA+PTA R SFA (01/13/13) 2.  Open amp R 4th and 5th toe by Dr. Donnetta Hutching (01/29/23)  The patient has no rest pain and wounds include: none.  The patient notes symptoms have not progressed.  The patient's treatment regimen currently included: maximal medical management.  In regards to his chronic kidney disease stage IV, pt still is undecided on whether to he wants to proceed with HD.   The patient's PMH, PSH, SH, and FamHx are unchanged from 10/31/15.  Current Outpatient Prescriptions  Medication Sig Dispense Refill  . ADVAIR DISKUS 250-50 MCG/DOSE AEPB INHALE ONE PUFF INTO THE LUNGS TWO TIMESDAILY 60 each 3  . amLODipine (NORVASC) 10 MG tablet Take 1 tablet (10 mg total) by mouth daily. 90 tablet 3  . aspirin 81 MG tablet TAKE ONE (1) TABLET BY MOUTH EVERY DAY 30 tablet 3  . atorvastatin (LIPITOR) 40 MG tablet TAKE 1 TABLET BY MOUTH EVERY DAY AT 6 PM 90 tablet 3  . carvedilol (COREG) 25 MG tablet Take 2 tablets (50 mg total) by mouth 2 (two) times daily with a meal. 120 tablet 3  . cloNIDine (CATAPRES) 0.3 MG tablet Take 1 tablet (0.3 mg total) by mouth 2 (two) times daily. 60 tablet 3  . doxazosin (CARDURA) 8 MG tablet Take 1 tablet (8 mg total) by mouth 2 (two) times daily. 60 tablet 6  . hydrALAZINE (APRESOLINE) 100 MG tablet TAKE ONE TABLET BY MOUTH EVERY EIGHT HOURS 90 tablet 3  . insulin aspart (NOVOLOG FLEXPEN) 100 UNIT/ML FlexPen Inject 35 Units into the skin 3 (three) times daily with meals. 15 mL 0  . Insulin Glargine (LANTUS SOLOSTAR) 100 UNIT/ML Solostar Pen Inject 60 Units into the skin daily.    . isosorbide mononitrate (IMDUR) 60 MG 24 hr tablet TAKE ONE (1) TABLET BY MOUTH EVERY DAY 30 tablet 3  . levothyroxine (SYNTHROID, LEVOTHROID) 50 MCG tablet TAKE ONE  (1) TABLET BY MOUTH EVERY DAY BEFORE BREAKFAST 30 tablet 3  . nitroGLYCERIN (NITROSTAT) 0.4 MG SL tablet Place 1 tablet (0.4 mg total) under the tongue every 5 (five) minutes as needed for chest pain. (Patient not taking: Reported on 11/21/2015) 30 tablet 12  . pantoprazole (PROTONIX) 40 MG tablet TAKE 1 TABLET BY MOUTH DAILY 30 tablet 3  . potassium chloride SA (K-DUR,KLOR-CON) 20 MEQ tablet Take 95meq (1 tablet ) in the AM and 24meq (1/2 tablet) in the PM 135 tablet 3  . PROVENTIL HFA 108 (90 Base) MCG/ACT inhaler INHALE 2 PUFFS INTO THE LUNGS EVERY 6 HOURS AS NEEDED FOR WHEEZING ORSHORTNESS OF BREATH 6.7 g 3  . SURE COMFORT PEN NEEDLES 31G X 8 MM MISC USE WITH BOTH INSULIN PENS TWICE DAILY (4 PER DAY) 100 each 5  . torsemide (DEMADEX) 20 MG tablet Take 60mg  ( 3 tablets) in the AM and 40mg  ( 2 tablets )in the PM 150 tablet 3   No current facility-administered medications for this visit.     No Known Allergies  On ROS today: no rest pain, limited ambulation   Physical Examination  Vitals:   11/28/15 1121  BP: 128/60  Pulse: 63  Resp: 18  Temp: 97.2 F (36.2 C)  SpO2: 99%  Weight: 217 lb (98.4 kg)  Height: 5\' 8"  (1.727 m)  Body mass index is 32.99 kg/m.  General: Alert, O x 3, Obese,NAD  Pulmonary: Sym exp, good B air movt,CTA B  Cardiac: RRR, Nl S1, S2, no Murmurs, No rubs, No S3,S4  Vascular: Vessel Right Left  Radial Palpable Palpable  Brachial Palpable Palpable  Carotid Palpable, No Bruit Palpable, No Bruit  Aorta Not palpable N/A  Femoral Palpable Palpable  Popliteal Not palpable Not palpable  PT Not palpable Not palpable  DP Not palpable Not palpable   Gastrointestinal: soft, non-distended, non-tender to palpation, No guarding or rebound, no HSM, no masses, no CVAT B, No palpable prominent aortic pulse due to pannus    Musculoskeletal: M/S 5/5 throughout  , Extremities without ischemic changes except healed R 4th and 5th toe amp, Edema in B legs  1+,  Neurologic: CN 2-12 intact , Pain and light touch intact in extremities , Motor exam as listed above  Non-invasive Studies  ABI (Date: 11/28/2015)  R:   ABI: 1.11 ([place]),   DP: mono  PT: mono  TBI:  0.31  L:   ABI: 1.51,   DP: mono  PT: mono  TBI: 0.20   Medical Decision Making  Leonard [name] is a 65 y.o. 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  The patient is asx currently, likely due to limited ambulation.  I have encouraged the walking plan.  Based on the patient's vascular studies and examination, I have offered the patient: q6 month ABI.  I discussed in depth with the patient the nature of atherosclerosis, and emphasized the importance of maximal medical management including strict control of blood pressure, blood glucose, and lipid levels, antiplatelet agents, obtaining regular exercise, and cessation of smoking.    The patient is aware that without maximal medical management the underlying atherosclerotic disease process will progress, limiting the benefit of any interventions. The patient is currently on a statin:  Lipitor. The patient is currently on an anti-platelet: ASA.  Thank you for allowing [place] to participate in this patient's care.   Adele Barthel, MD, FACS [facility] Office: [phone] Pager: [phone]

## 2015-11-26 NOTE — Telephone Encounter (Signed)
-----   Message from Shirley Friar, PA-C sent at 11/26/2015 11:28 AM EDT ----- Hold torsemide tonight and tomorrow AM.    Billy Fischer tomorrow EVENING at dose of 40 mg BID. (decreased from 60/40)  Will recheck labs at follow up next week.   Thanks.    Legrand Como [address] [place], PA-C 11/26/2015 11:28 AM

## 2015-11-26 NOTE — Telephone Encounter (Signed)
Patient aware. Patient voice understanding, patient reports he has a nurse that helps him fill his pill box may return call later for further clarification

## 2015-11-27 ENCOUNTER — Telehealth (HOSPITAL_COMMUNITY): Payer: Self-pay | Admitting: Surgery

## 2015-11-27 NOTE — Telephone Encounter (Signed)
[name] called to get clarification for Mr. Leonard [name]'s medication changes made yesterday.  I have given her the instructions that he was communicated yesterday.  [name] will make the necessary changes to his pill box.

## 2015-11-28 ENCOUNTER — Ambulatory Visit (HOSPITAL_COMMUNITY)
Admission: RE | Admit: 2015-11-28 | Discharge: 2015-11-28 | Disposition: A | Discharge status: Home / Self Care | Payer: Medicaid Other | Source: Ambulatory Visit | Attending: Vascular Surgery | Admitting: Vascular Surgery

## 2015-11-28 ENCOUNTER — Encounter (HOSPITAL_COMMUNITY): Payer: Medicaid Other

## 2015-11-28 ENCOUNTER — Encounter: Payer: Self-pay | Admitting: Vascular Surgery

## 2015-11-28 ENCOUNTER — Ambulatory Visit (INDEPENDENT_AMBULATORY_CARE_PROVIDER_SITE_OTHER): Payer: Medicaid Other | Admitting: Vascular Surgery

## 2015-11-28 VITALS — BP 128/60 | HR 63 | Temp 97.2°F | Resp 18 | Ht 68.0 in | Wt 217.0 lb

## 2015-11-28 DIAGNOSIS — N184 Chronic kidney disease, stage 4 (severe): Secondary | ICD-10-CM

## 2015-11-28 DIAGNOSIS — E1122 Type 2 diabetes mellitus with diabetic chronic kidney disease: Secondary | ICD-10-CM | POA: Insufficient documentation

## 2015-11-28 DIAGNOSIS — E785 Hyperlipidemia, unspecified: Secondary | ICD-10-CM | POA: Insufficient documentation

## 2015-11-28 DIAGNOSIS — I70261 Atherosclerosis of native arteries of extremities with gangrene, right leg: Secondary | ICD-10-CM

## 2015-11-28 DIAGNOSIS — I129 Hypertensive chronic kidney disease with stage 1 through stage 4 chronic kidney disease, or unspecified chronic kidney disease: Secondary | ICD-10-CM | POA: Diagnosis not present

## 2015-12-03 ENCOUNTER — Telehealth: Payer: Self-pay | Admitting: Hematology and Oncology

## 2015-12-03 NOTE — Telephone Encounter (Signed)
Spoke with patient re appointments 11/2. Patient to get new schedule 11/2.

## 2015-12-05 ENCOUNTER — Ambulatory Visit (HOSPITAL_COMMUNITY)
Admission: RE | Admit: 2015-12-05 | Discharge: 2015-12-05 | Disposition: A | Discharge status: Home / Self Care | Payer: Medicaid Other | Source: Ambulatory Visit | Attending: Cardiology | Admitting: Cardiology

## 2015-12-05 VITALS — BP 122/58 | HR 70 | Wt 216.4 lb

## 2015-12-05 DIAGNOSIS — I5032 Chronic diastolic (congestive) heart failure: Secondary | ICD-10-CM | POA: Diagnosis not present

## 2015-12-05 DIAGNOSIS — Z951 Presence of aortocoronary bypass graft: Secondary | ICD-10-CM | POA: Diagnosis not present

## 2015-12-05 DIAGNOSIS — N184 Chronic kidney disease, stage 4 (severe): Secondary | ICD-10-CM | POA: Diagnosis not present

## 2015-12-05 DIAGNOSIS — E1122 Type 2 diabetes mellitus with diabetic chronic kidney disease: Secondary | ICD-10-CM | POA: Diagnosis not present

## 2015-12-05 DIAGNOSIS — I130 Hypertensive heart and chronic kidney disease with heart failure and stage 1 through stage 4 chronic kidney disease, or unspecified chronic kidney disease: Secondary | ICD-10-CM | POA: Insufficient documentation

## 2015-12-05 DIAGNOSIS — I2510 Atherosclerotic heart disease of native coronary artery without angina pectoris: Secondary | ICD-10-CM | POA: Diagnosis not present

## 2015-12-05 DIAGNOSIS — Z87891 Personal history of nicotine dependence: Secondary | ICD-10-CM

## 2015-12-05 DIAGNOSIS — Z8249 Family history of ischemic heart disease and other diseases of the circulatory system: Secondary | ICD-10-CM | POA: Insufficient documentation

## 2015-12-05 DIAGNOSIS — Z7982 Long term (current) use of aspirin: Secondary | ICD-10-CM | POA: Insufficient documentation

## 2015-12-05 DIAGNOSIS — Z79899 Other long term (current) drug therapy: Secondary | ICD-10-CM | POA: Insufficient documentation

## 2015-12-05 DIAGNOSIS — E785 Hyperlipidemia, unspecified: Secondary | ICD-10-CM | POA: Diagnosis not present

## 2015-12-05 DIAGNOSIS — E039 Hypothyroidism, unspecified: Secondary | ICD-10-CM | POA: Diagnosis not present

## 2015-12-05 DIAGNOSIS — I739 Peripheral vascular disease, unspecified: Secondary | ICD-10-CM | POA: Diagnosis not present

## 2015-12-05 DIAGNOSIS — D472 Monoclonal gammopathy: Secondary | ICD-10-CM | POA: Insufficient documentation

## 2015-12-05 DIAGNOSIS — Z794 Long term (current) use of insulin: Secondary | ICD-10-CM | POA: Insufficient documentation

## 2015-12-05 DIAGNOSIS — G4733 Obstructive sleep apnea (adult) (pediatric): Secondary | ICD-10-CM | POA: Diagnosis not present

## 2015-12-05 DIAGNOSIS — J449 Chronic obstructive pulmonary disease, unspecified: Secondary | ICD-10-CM | POA: Diagnosis not present

## 2015-12-05 DIAGNOSIS — I10 Essential (primary) hypertension: Secondary | ICD-10-CM | POA: Diagnosis not present

## 2015-12-05 LAB — BASIC METABOLIC PANEL
ANION GAP: 11 (ref 5–15)
BUN: 52 mg/dL — ABNORMAL HIGH (ref 6–20)
CO2: 23 mmol/L (ref 22–32)
Calcium: 9 mg/dL (ref 8.9–10.3)
Chloride: 104 mmol/L (ref 101–111)
Creatinine, Ser: 3.05 mg/dL — ABNORMAL HIGH (ref 0.61–1.24)
GFR, EST AFRICAN AMERICAN: 23 mL/min — AB (ref >60)
GFR, EST NON AFRICAN AMERICAN: 20 mL/min — AB (ref >60)
GLUCOSE: 284 mg/dL — AB (ref 65–99)
POTASSIUM: 4.4 mmol/L (ref 3.5–5.1)
Sodium: 138 mmol/L (ref 135–145)

## 2015-12-05 LAB — BRAIN NATRIURETIC PEPTIDE: B Natriuretic Peptide: 64.3 pg/mL (ref 0.0–100.0)

## 2015-12-05 NOTE — Patient Instructions (Signed)
Routine lab work today. Will notify you of abnormal results, otherwise no news is good news!  Follow up with Dr. Aundra Dubin as scheduled.  Do the following things EVERYDAY: 1) Weigh yourself in the morning before breakfast. Write it down and keep it in a log. 2) Take your medicines as prescribed 3) Eat low salt foods-Limit salt (sodium) to 2000 mg per day.  4) Stay as active as you can everyday 5) Limit all fluids for the day to less than 2 liters

## 2015-12-05 NOTE — Progress Notes (Signed)
Advanced Heart Failure Medication Review by a Pharmacist  Does the patient  feel that his/her medications are working for him/her?  yes  Has the patient been experiencing any side effects to the medications prescribed?  no  Does the patient measure his/her own blood pressure or blood glucose at home?  yes   Does the patient have any problems obtaining medications due to transportation or finances?   no  Understanding of regimen: good Understanding of indications: good Potential of compliance: good Patient understands to avoid NSAIDs. Patient understands to avoid decongestants.  Issues to address at subsequent visits: None   Pharmacist comments:  Leonard [name] is a pleasant 65 yo M presenting without a medication list but Leonard [name] (paramedicine) has been filling his pillbox weekly for him. He reports good compliance with his regimen and did not have any specific medication-related questions or concerns for me at this time.   Leonard [name]. Leonard [name], PharmD, BCPS, CPP Clinical Pharmacist Pager: [phone] Phone: [phone] 12/05/2015 11:15 AM      Time with patient: 10 minutes Preparation and documentation time: 2 minutes Total time: 12 minutes

## 2015-12-05 NOTE — Progress Notes (Signed)
Advanced Heart Failure Clinic Note   PCP: Dr. Juanito Doom Cardiology: Dr. Aundra Dubin  65 yo with history of CAD s/p CABG, PAD, chronic diastolic CHF, CKD stage IV, and HTN presents for cardiology followup.  He has had multiple admissions this year for diastolic CHF.  Last echo in 5/17 showed EF 60-65% with grade II diastolic dysfunction.  He also has COPD with PFTs showing severe obstruction.  Last admission for CHF was in 7/17.    He presents presents for regular follow up. At last visit torsemide increase, but subsequently brought back down with elevated Creatinine.  Weight down 1 lb.  SBP much improved from last two visits.  Paramedicine following. Can walk 10-15 minutes on flat ground without DOE.  Avoids steps and hills. Is occasionally SOB after showering. Denies lightheadedness or dizziness. Denies CP. Had two ham sandwiches with tomato for breakfast.  He states it is difficult to watch salt and fluid with his limited income.   Labs (8/17): K 3.9, creatinine 2.33, HCT 28.5 Labs (10/15/15: K 3.4, creatinine 2.28, HCT 29.8  PMH: 1. Chronic diastolic CHF: Multiple recent admissions.  Echo (5/17) with EF 60-65%, grade II diastolic dysfunction, normal RV size and systolic function.  2. PAD: Right femoral PCI in 12/14. Peripheral arterial dopplers in 6/17 with occluded right SFA.  He follows with [facility].  3. HTN: Renal artery dopplers negative in 5/17.  4. Hypothyroidism 5. CKD stage IV: Follows with Dr. Marval Regal. 6. CAD: s/p CABG x 3 in 3/15.  7. OSA: Mild, not on CPAP.  8. COPD: PFTs (3/15) with FVC 77%, FEV1 47%, ratio 59%, TLC 55% => severe obstruction.  Prior smoker.  9. 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 Hyperlipidemia 12. Anemia of renal disease  Social History   Social History  . Marital status: Single    Spouse name: N/A  . Number of children: N/A  . Years of education: N/A   Occupational History  . Not on file.   Social History Main Topics  . Smoking status: Former Smoker   Packs/day: 1.00    Years: 46.00    Types: Cigarettes    Start date: 02/04/1967    Quit date: 02/03/2013  . Smokeless tobacco: Never Used     Comment: Doing well with quitting.  . Alcohol use No  . Drug use: No  . Sexual activity: Not Currently   Other Topics Concern  . Not on file   Social History Narrative   Lives with sister, [name] in [place].  Retired - former Sports coach.   Family History  Problem Relation Age of Onset  . Cancer Mother     lung cancer  . Hypertension Mother   . Cancer Sister     patient thinks it was uterine cancer  . Hypertension Sister   . Heart attack Sister   . Stroke Neg Hx    ROS: All systems reviewed and negative except as per HPI.   Current Outpatient Prescriptions  Medication Sig Dispense Refill  . ADVAIR DISKUS 250-50 MCG/DOSE AEPB INHALE ONE PUFF INTO THE LUNGS TWO TIMESDAILY 60 each 3  . amLODipine (NORVASC) 10 MG tablet Take 1 tablet (10 mg total) by mouth daily. 90 tablet 3  . aspirin 81 MG tablet TAKE ONE (1) TABLET BY MOUTH EVERY DAY 30 tablet 3  . atorvastatin (LIPITOR) 40 MG tablet TAKE 1 TABLET BY MOUTH EVERY DAY AT 6 PM 90 tablet 3  . carvedilol (COREG) 25 MG tablet Take 2 tablets (50 mg  total) by mouth 2 (two) times daily with a meal. 120 tablet 3  . cloNIDine (CATAPRES) 0.3 MG tablet Take 1 tablet (0.3 mg total) by mouth 2 (two) times daily. 60 tablet 3  . doxazosin (CARDURA) 8 MG tablet Take 1 tablet (8 mg total) by mouth 2 (two) times daily. 60 tablet 6  . hydrALAZINE (APRESOLINE) 100 MG tablet TAKE ONE TABLET BY MOUTH EVERY EIGHT HOURS 90 tablet 3  . insulin aspart (NOVOLOG FLEXPEN) 100 UNIT/ML FlexPen Inject 35 Units into the skin 3 (three) times daily with meals. 15 mL 0  . Insulin Glargine (LANTUS SOLOSTAR) 100 UNIT/ML Solostar Pen Inject 60 Units into the skin daily.    . isosorbide mononitrate (IMDUR) 60 MG 24 hr tablet TAKE ONE (1) TABLET BY MOUTH EVERY DAY 30 tablet 3  . levothyroxine (SYNTHROID, LEVOTHROID) 50 MCG  tablet TAKE ONE (1) TABLET BY MOUTH EVERY DAY BEFORE BREAKFAST 30 tablet 3  . pantoprazole (PROTONIX) 40 MG tablet TAKE 1 TABLET BY MOUTH DAILY 30 tablet 3  . potassium chloride SA (K-DUR,KLOR-CON) 20 MEQ tablet Take 65meq (1 tablet ) in the AM and 87meq (1/2 tablet) in the PM 135 tablet 3  . PROVENTIL HFA 108 (90 Base) MCG/ACT inhaler INHALE 2 PUFFS INTO THE LUNGS EVERY 6 HOURS AS NEEDED FOR WHEEZING ORSHORTNESS OF BREATH 6.7 g 3  . SURE COMFORT PEN NEEDLES 31G X 8 MM MISC USE WITH BOTH INSULIN PENS TWICE DAILY (4 PER DAY) 100 each 5  . torsemide (DEMADEX) 20 MG tablet Take 2 tablets (40 mg total) by mouth 2 (two) times daily. 150 tablet 3  . nitroGLYCERIN (NITROSTAT) 0.4 MG SL tablet Place 1 tablet (0.4 mg total) under the tongue every 5 (five) minutes as needed for chest pain. (Patient not taking: Reported on 12/05/2015) 30 tablet 12   No current facility-administered medications for this encounter.      BP (!) 122/58   Pulse 70   Wt 216 lb 6.4 oz (98.2 kg)   SpO2 98%   BMI 32.90 kg/m    Wt Readings from Last 3 Encounters:  12/05/15 216 lb 6.4 oz (98.2 kg)  11/28/15 217 lb (98.4 kg)  11/22/15 220 lb 1.6 oz (99.8 kg)    General: NAD. Ambulated in clinic without difficulty.  HEENT: Normal.  Neck: JVP ~7-8 cm. No thyromegaly or thyroid nodule.  Lungs: Clear, normal effort.  CV: Nondisplaced PMI.  RRR. No M/G/R appreciated. Trace to 1+ ankle edema. No carotid bruit.  Unable to palpate pedal pulses.  Abdomen: Soft, NT, ND, no HSM. No bruits or masses. +BS  Skin: Intact without lesions or rashes.  Neurologic: Alert and oriented x 3. No neurological deficit. CN I-XII grossly intact. Equal grip strength.  Psych: Normal affect. Extremities: No clubbing or cyanosis. 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   Assessment/Plan: 1. Chronic diastolic CHF:  Complicated by CKD Stage IV. - Volume status stable to mildly elevated with continued dietary indiscretion. Encouraged to watch fluid and  salt.  - NYHA class III symptoms.  He has had multiple admissions this year, large component of this is poor insight and non-compliance.   - Continue torsemide 40 mg BID. BMET/BNP today. Continue compression hose.  - Continue potassium 20 meq in am and 10 meq in pm for now.  Labs today as above.   - Still drinking > 2L. Again stressed compliance.  Difficult to adjust diuretics with his renal function.    - Again reviewed dietary education.  Had deli ham on white bread with tomato for breakfast x 2.    2. Chronic HTN  - Much improved with medical compliance, though he continues to eat too much salt. Again encouraged to monitor sodium.  - Continue clonidine 0.3 mg bid.   - Continue Coreg 50 mg BID, Hydralazine 100 mg TID, and Imdur 60 mg daily - Continue Amlodipine 10 mg daily.  - Continue doxazosin 8 mg BID.  - Paramedicine following.  3. CAD: S/p CABG. - Denies CP. Continue ASA 81 mg and statin.   4. COPD: Severe by 3/15 PFTs.  - remains abstinent from cigarettes 5. CKD Stage IV.  Sees Dr Marval Regal.  - Limits medication and diuresis.     - BMET today.  - He said he saw > 1 month ago and isn't sure when he goes back.  Needs to call and make appointment. Pt aware.  6. PAD: Not particularly limited by claudication now.  Saw [facility] 6/17, has followup.  7. Social - HFSW to see today. Limited income leads to difficulty making low sodium choices.   Labs today. Keep 1 month follow up with Dr. Leo Rod  PA-C  12/05/2015

## 2015-12-05 NOTE — Progress Notes (Signed)
Paramedicine Multidisciplinary Team Update  Leonard [name] is enrolled in the [facility].  The patient presents today in association with an [facility] Appointment.  Patient living/home environment and social support-- Patient states he lives with his sister Leonard [name] in her home. He also noted his sister Leonard [name] who lives in [place] as a support.  He denies any children or other support system.  Insurance/ Prescription Coverage-- He reports he receives SSI and has medicaid benefits.  Does the patient have a scale and weigh each day? Yes  Does the patient follow a low salt diet? CSW and paramedic discussed diet and provided nutritional information.  Is patient compliant with medications? [name] paramedic pre fills all medications.  Does the patient have transportation for physician appointments?Patient reports he calls medicaid transport although today they did not show up. CSW provided patient with a bus pass to get home.  Does the patient contact the HF Clinic appropriately with worsening symptoms or weight increases? Patient was able to verbalize HF symptoms and when to call the clinic.  Do you have an Advanced Directive?  CSW discussed at length with patient and provided some reading material. Patient shared he was interested in completing as "I don't want to be on machines".  Are there any identified obstacles / challenges for adherence to current treatment plan? Patient denies any concerns although CSW and paramedic discussed at length the importance of diet and medication compliance for successful tools. CSW will continue to monitor and coordinate care with community paramedicine program. Raquel Sarna, LCSW [phone]

## 2015-12-05 NOTE — Addendum Note (Signed)
Encounter addended by: Louann Liv, LCSW on: 12/05/2015  2:37 PM<BR>    Actions taken: Sign clinical note

## 2015-12-06 ENCOUNTER — Ambulatory Visit (HOSPITAL_BASED_OUTPATIENT_CLINIC_OR_DEPARTMENT_OTHER): Payer: Medicaid Other

## 2015-12-06 ENCOUNTER — Other Ambulatory Visit (HOSPITAL_BASED_OUTPATIENT_CLINIC_OR_DEPARTMENT_OTHER): Payer: Medicaid Other

## 2015-12-06 VITALS — BP 139/58 | HR 77 | Temp 97.4°F | Resp 18

## 2015-12-06 DIAGNOSIS — D631 Anemia in chronic kidney disease: Secondary | ICD-10-CM

## 2015-12-06 DIAGNOSIS — N184 Chronic kidney disease, stage 4 (severe): Secondary | ICD-10-CM | POA: Diagnosis present

## 2015-12-06 DIAGNOSIS — N189 Chronic kidney disease, unspecified: Principal | ICD-10-CM

## 2015-12-06 LAB — CBC & DIFF AND RETIC
BASO%: 0.5 % (ref 0.0–2.0)
Basophils Absolute: 0 10*3/uL (ref 0.0–0.1)
EOS ABS: 0.4 10*3/uL (ref 0.0–0.5)
EOS%: 5.7 % (ref 0.0–7.0)
HCT: 29.6 % — ABNORMAL LOW (ref 38.4–49.9)
HEMOGLOBIN: 9.2 g/dL — AB (ref 13.0–17.1)
IMMATURE RETIC FRACT: 5.8 % (ref 3.00–10.60)
LYMPH#: 1 10*3/uL (ref 0.9–3.3)
LYMPH%: 15 % (ref 14.0–49.0)
MCH: 23.2 pg — ABNORMAL LOW (ref 27.2–33.4)
MCHC: 31.1 g/dL — ABNORMAL LOW (ref 32.0–36.0)
MCV: 74.6 fL — AB (ref 79.3–98.0)
MONO#: 0.6 10*3/uL (ref 0.1–0.9)
MONO%: 9.6 % (ref 0.0–14.0)
NEUT%: 69.2 % (ref 39.0–75.0)
NEUTROS ABS: 4.5 10*3/uL (ref 1.5–6.5)
Platelets: 166 10*3/uL (ref 140–400)
RBC: 3.97 10*6/uL — ABNORMAL LOW (ref 4.20–5.82)
RDW: 19 % — ABNORMAL HIGH (ref 11.0–14.6)
RETIC CT ABS: 56.37 10*3/uL (ref 34.80–93.90)
Retic %: 1.42 % (ref 0.80–1.80)
WBC: 6.5 10*3/uL (ref 4.0–10.3)

## 2015-12-06 MED ORDER — DARBEPOETIN ALFA 500 MCG/ML IJ SOSY
500.0000 ug | PREFILLED_SYRINGE | Freq: Once | INTRAMUSCULAR | Status: AC
  Administered 2015-12-06: 500 ug via SUBCUTANEOUS
  Filled 2015-12-06: qty 1

## 2015-12-06 NOTE — Patient Instructions (Signed)
Darbepoetin Alfa injection What is this medicine? DARBEPOETIN ALFA (dar be POE e tin AL fa) helps your body make more red blood cells. It is used to treat anemia caused by chronic kidney failure and chemotherapy. This medicine may be used for other purposes; ask your health care provider or pharmacist if you have questions. What should I tell my health care provider before I take this medicine? They need to know if you have any of these conditions: -blood clotting disorders or history of blood clots -cancer patient not on chemotherapy -cystic fibrosis -heart disease, such as angina, heart failure, or a history of a heart attack -hemoglobin level of 12 g/dL or greater -high blood pressure -low levels of folate, iron, or vitamin B12 -seizures -an unusual or allergic reaction to darbepoetin, erythropoietin, albumin, hamster proteins, latex, other medicines, foods, dyes, or preservatives -pregnant or trying to get pregnant -breast-feeding How should I use this medicine? This medicine is for injection into a vein or under the skin. It is usually given by a health care professional in a hospital or clinic setting. If you get this medicine at home, you will be taught how to prepare and give this medicine. Do not shake the solution before you withdraw a dose. Use exactly as directed. Take your medicine at regular intervals. Do not take your medicine more often than directed. It is important that you put your used needles and syringes in a special sharps container. Do not put them in a trash can. If you do not have a sharps container, call your pharmacist or healthcare provider to get one. Talk to your pediatrician regarding the use of this medicine in children. While this medicine may be used in children as young as 1 year for selected conditions, precautions do apply. Overdosage: If you think you have taken too much of this medicine contact a poison control center or emergency room at once. NOTE:  This medicine is only for you. Do not share this medicine with others. What if I miss a dose? If you miss a dose, take it as soon as you can. If it is almost time for your next dose, take only that dose. Do not take double or extra doses. What may interact with this medicine? Do not take this medicine with any of the following medications: -epoetin alfa This list may not describe all possible interactions. Give your health care provider a list of all the medicines, herbs, non-prescription drugs, or dietary supplements you use. Also tell them if you smoke, drink alcohol, or use illegal drugs. Some items may interact with your medicine. What should I watch for while using this medicine? Visit your prescriber or health care professional for regular checks on your progress and for the needed blood tests and blood pressure measurements. It is especially important for the doctor to make sure your hemoglobin level is in the desired range, to limit the risk of potential side effects and to give you the best benefit. Keep all appointments for any recommended tests. Check your blood pressure as directed. Ask your doctor what your blood pressure should be and when you should contact him or her. As your body makes more red blood cells, you may need to take iron, folic acid, or vitamin B supplements. Ask your doctor or health care provider which products are right for you. If you have kidney disease continue dietary restrictions, even though this medication can make you feel better. Talk with your doctor or health care professional about the   foods you eat and the vitamins that you take. What side effects may I notice from receiving this medicine? Side effects that you should report to your doctor or health care professional as soon as possible: -allergic reactions like skin rash, itching or hives, swelling of the face, lips, or tongue -breathing problems -changes in vision -chest pain -confusion, trouble speaking  or understanding -feeling faint or lightheaded, falls -high blood pressure -muscle aches or pains -pain, swelling, warmth in the leg -rapid weight gain -severe headaches -sudden numbness or weakness of the face, arm or leg -trouble walking, dizziness, loss of balance or coordination -seizures (convulsions) -swelling of the ankles, feet, hands -unusually weak or tired Side effects that usually do not require medical attention (report to your doctor or health care professional if they continue or are bothersome): -diarrhea -fever, chills (flu-like symptoms) -headaches -nausea, vomiting -redness, stinging, or swelling at site where injected This list may not describe all possible side effects. Call your doctor for medical advice about side effects. You may report side effects to FDA at 1-800-FDA-1088. Where should I keep my medicine? Keep out of the reach of children. Store in a refrigerator between 2 and 8 degrees C (36 and 46 degrees F). Do not freeze. Do not shake. Throw away any unused portion if using a single-dose vial. Throw away any unused medicine after the expiration date. NOTE: This sheet is a summary. It may not cover all possible information. If you have questions about this medicine, talk to your doctor, pharmacist, or health care provider.    2016, [employer]. (2008-01-04 10:23:57)  

## 2015-12-18 NOTE — Addendum Note (Signed)
Addended by: Thresa Ross C on: 12/18/2015 03:13 PM   Modules accepted: Orders

## 2015-12-20 ENCOUNTER — Other Ambulatory Visit (HOSPITAL_BASED_OUTPATIENT_CLINIC_OR_DEPARTMENT_OTHER): Payer: Medicaid Other

## 2015-12-20 ENCOUNTER — Ambulatory Visit (HOSPITAL_BASED_OUTPATIENT_CLINIC_OR_DEPARTMENT_OTHER): Payer: Medicaid Other

## 2015-12-20 VITALS — BP 128/58 | HR 72 | Temp 98.2°F | Resp 20

## 2015-12-20 DIAGNOSIS — N189 Chronic kidney disease, unspecified: Principal | ICD-10-CM

## 2015-12-20 DIAGNOSIS — D631 Anemia in chronic kidney disease: Secondary | ICD-10-CM

## 2015-12-20 DIAGNOSIS — N184 Chronic kidney disease, stage 4 (severe): Secondary | ICD-10-CM

## 2015-12-20 LAB — CBC & DIFF AND RETIC
BASO%: 0.5 % (ref 0.0–2.0)
BASOS ABS: 0 10*3/uL (ref 0.0–0.1)
EOS%: 7.7 % — AB (ref 0.0–7.0)
Eosinophils Absolute: 0.5 10*3/uL (ref 0.0–0.5)
HEMATOCRIT: 28.2 % — AB (ref 38.4–49.9)
HGB: 8.5 g/dL — ABNORMAL LOW (ref 13.0–17.1)
Immature Retic Fract: 15.4 % — ABNORMAL HIGH (ref 3.00–10.60)
LYMPH#: 1.3 10*3/uL (ref 0.9–3.3)
LYMPH%: 22.5 % (ref 14.0–49.0)
MCH: 22.5 pg — ABNORMAL LOW (ref 27.2–33.4)
MCHC: 30.1 g/dL — AB (ref 32.0–36.0)
MCV: 74.8 fL — AB (ref 79.3–98.0)
MONO#: 0.5 10*3/uL (ref 0.1–0.9)
MONO%: 7.7 % (ref 0.0–14.0)
NEUT#: 3.6 10*3/uL (ref 1.5–6.5)
NEUT%: 61.6 % (ref 39.0–75.0)
PLATELETS: 173 10*3/uL (ref 140–400)
RBC: 3.77 10*6/uL — ABNORMAL LOW (ref 4.20–5.82)
RDW: 19.2 % — ABNORMAL HIGH (ref 11.0–14.6)
RETIC CT ABS: 59.94 10*3/uL (ref 34.80–93.90)
Retic %: 1.59 % (ref 0.80–1.80)
WBC: 5.8 10*3/uL (ref 4.0–10.3)

## 2015-12-20 MED ORDER — DARBEPOETIN ALFA 500 MCG/ML IJ SOSY
500.0000 ug | PREFILLED_SYRINGE | Freq: Once | INTRAMUSCULAR | Status: AC
  Administered 2015-12-20: 500 ug via SUBCUTANEOUS
  Filled 2015-12-20: qty 1

## 2015-12-20 NOTE — Patient Instructions (Signed)
Darbepoetin Alfa injection What is this medicine? DARBEPOETIN ALFA (dar be POE e tin AL fa) helps your body make more red blood cells. It is used to treat anemia caused by chronic kidney failure and chemotherapy. This medicine may be used for other purposes; ask your health care provider or pharmacist if you have questions. What should I tell my health care provider before I take this medicine? They need to know if you have any of these conditions: -blood clotting disorders or history of blood clots -cancer patient not on chemotherapy -cystic fibrosis -heart disease, such as angina, heart failure, or a history of a heart attack -hemoglobin level of 12 g/dL or greater -high blood pressure -low levels of folate, iron, or vitamin B12 -seizures -an unusual or allergic reaction to darbepoetin, erythropoietin, albumin, hamster proteins, latex, other medicines, foods, dyes, or preservatives -pregnant or trying to get pregnant -breast-feeding How should I use this medicine? This medicine is for injection into a vein or under the skin. It is usually given by a health care professional in a hospital or clinic setting. If you get this medicine at home, you will be taught how to prepare and give this medicine. Do not shake the solution before you withdraw a dose. Use exactly as directed. Take your medicine at regular intervals. Do not take your medicine more often than directed. It is important that you put your used needles and syringes in a special sharps container. Do not put them in a trash can. If you do not have a sharps container, call your pharmacist or healthcare provider to get one. Talk to your pediatrician regarding the use of this medicine in children. While this medicine may be used in children as young as 1 year for selected conditions, precautions do apply. Overdosage: If you think you have taken too much of this medicine contact a poison control center or emergency room at once. NOTE:  This medicine is only for you. Do not share this medicine with others. What if I miss a dose? If you miss a dose, take it as soon as you can. If it is almost time for your next dose, take only that dose. Do not take double or extra doses. What may interact with this medicine? Do not take this medicine with any of the following medications: -epoetin alfa This list may not describe all possible interactions. Give your health care provider a list of all the medicines, herbs, non-prescription drugs, or dietary supplements you use. Also tell them if you smoke, drink alcohol, or use illegal drugs. Some items may interact with your medicine. What should I watch for while using this medicine? Visit your prescriber or health care professional for regular checks on your progress and for the needed blood tests and blood pressure measurements. It is especially important for the doctor to make sure your hemoglobin level is in the desired range, to limit the risk of potential side effects and to give you the best benefit. Keep all appointments for any recommended tests. Check your blood pressure as directed. Ask your doctor what your blood pressure should be and when you should contact him or her. As your body makes more red blood cells, you may need to take iron, folic acid, or vitamin B supplements. Ask your doctor or health care provider which products are right for you. If you have kidney disease continue dietary restrictions, even though this medication can make you feel better. Talk with your doctor or health care professional about the   foods you eat and the vitamins that you take. What side effects may I notice from receiving this medicine? Side effects that you should report to your doctor or health care professional as soon as possible: -allergic reactions like skin rash, itching or hives, swelling of the face, lips, or tongue -breathing problems -changes in vision -chest pain -confusion, trouble speaking  or understanding -feeling faint or lightheaded, falls -high blood pressure -muscle aches or pains -pain, swelling, warmth in the leg -rapid weight gain -severe headaches -sudden numbness or weakness of the face, arm or leg -trouble walking, dizziness, loss of balance or coordination -seizures (convulsions) -swelling of the ankles, feet, hands -unusually weak or tired Side effects that usually do not require medical attention (report to your doctor or health care professional if they continue or are bothersome): -diarrhea -fever, chills (flu-like symptoms) -headaches -nausea, vomiting -redness, stinging, or swelling at site where injected This list may not describe all possible side effects. Call your doctor for medical advice about side effects. You may report side effects to FDA at 1-800-FDA-1088. Where should I keep my medicine? Keep out of the reach of children. Store in a refrigerator between 2 and 8 degrees C (36 and 46 degrees F). Do not freeze. Do not shake. Throw away any unused portion if using a single-dose vial. Throw away any unused medicine after the expiration date. NOTE: This sheet is a summary. It may not cover all possible information. If you have questions about this medicine, talk to your doctor, pharmacist, or health care provider.    2016, [employer]. (2008-01-04 10:23:57)  

## 2015-12-24 ENCOUNTER — Telehealth (HOSPITAL_COMMUNITY): Payer: Self-pay | Admitting: *Deleted

## 2015-12-24 NOTE — Telephone Encounter (Signed)
Leonard [name] called to report patient's blood pressure is elevated at 160/70 and said he is having episodes of dizziness.  She also reported that patient takes medications at all different times with no set schedule.  We are aware and [name] will continue to follow.

## 2015-12-26 ENCOUNTER — Ambulatory Visit: Payer: Medicaid Other | Admitting: Cardiovascular Disease

## 2015-12-31 ENCOUNTER — Telehealth (HOSPITAL_COMMUNITY): Payer: Self-pay

## 2015-12-31 NOTE — Telephone Encounter (Signed)
Leonard [name] with CHF Paramedicine called CHF clinic triage line and left VM stating patient's BP up this morning 180/70's.  Also endorses patient forgot to take Fridays and this mornings medications. Patient asymptomatic otherwise, weight stable 213lbs, no swelling, SOB noted. Will have patient take medication.  No further orders/changes at this time per CHF providers.  Renee Pain, RN

## 2016-01-03 ENCOUNTER — Other Ambulatory Visit (HOSPITAL_BASED_OUTPATIENT_CLINIC_OR_DEPARTMENT_OTHER): Payer: Medicaid Other

## 2016-01-03 ENCOUNTER — Ambulatory Visit (HOSPITAL_BASED_OUTPATIENT_CLINIC_OR_DEPARTMENT_OTHER): Payer: Medicaid Other

## 2016-01-03 VITALS — BP 106/52 | HR 69 | Temp 98.1°F | Resp 18

## 2016-01-03 DIAGNOSIS — N189 Chronic kidney disease, unspecified: Principal | ICD-10-CM

## 2016-01-03 DIAGNOSIS — N184 Chronic kidney disease, stage 4 (severe): Secondary | ICD-10-CM | POA: Diagnosis not present

## 2016-01-03 DIAGNOSIS — D631 Anemia in chronic kidney disease: Secondary | ICD-10-CM

## 2016-01-03 LAB — CBC & DIFF AND RETIC
BASO%: 0.4 % (ref 0.0–2.0)
BASOS ABS: 0 10*3/uL (ref 0.0–0.1)
EOS ABS: 0.4 10*3/uL (ref 0.0–0.5)
EOS%: 4.9 % (ref 0.0–7.0)
HEMATOCRIT: 29.6 % — AB (ref 38.4–49.9)
HEMOGLOBIN: 9 g/dL — AB (ref 13.0–17.1)
Immature Retic Fract: 11.6 % — ABNORMAL HIGH (ref 3.00–10.60)
LYMPH#: 1.2 10*3/uL (ref 0.9–3.3)
LYMPH%: 16.4 % (ref 14.0–49.0)
MCH: 22.5 pg — ABNORMAL LOW (ref 27.2–33.4)
MCHC: 30.4 g/dL — AB (ref 32.0–36.0)
MCV: 74 fL — ABNORMAL LOW (ref 79.3–98.0)
MONO#: 0.6 10*3/uL (ref 0.1–0.9)
MONO%: 7.3 % (ref 0.0–14.0)
NEUT#: 5.4 10*3/uL (ref 1.5–6.5)
NEUT%: 71 % (ref 39.0–75.0)
Platelets: 184 10*3/uL (ref 140–400)
RBC: 4 10*6/uL — ABNORMAL LOW (ref 4.20–5.82)
RDW: 18.8 % — AB (ref 11.0–14.6)
RETIC %: 1.94 % — AB (ref 0.80–1.80)
RETIC CT ABS: 77.6 10*3/uL (ref 34.80–93.90)
WBC: 7.6 10*3/uL (ref 4.0–10.3)

## 2016-01-03 MED ORDER — DARBEPOETIN ALFA 500 MCG/ML IJ SOSY
500.0000 ug | PREFILLED_SYRINGE | Freq: Once | INTRAMUSCULAR | Status: AC
Start: 2016-01-03 — End: 2016-01-03
  Administered 2016-01-03: 500 ug via SUBCUTANEOUS
  Filled 2016-01-03: qty 1

## 2016-01-03 NOTE — Patient Instructions (Signed)
Darbepoetin Alfa injection What is this medicine? DARBEPOETIN ALFA (dar be POE e tin AL fa) helps your body make more red blood cells. It is used to treat anemia caused by chronic kidney failure and chemotherapy. This medicine may be used for other purposes; ask your health care provider or pharmacist if you have questions. What should I tell my health care provider before I take this medicine? They need to know if you have any of these conditions: -blood clotting disorders or history of blood clots -cancer patient not on chemotherapy -cystic fibrosis -heart disease, such as angina, heart failure, or a history of a heart attack -hemoglobin level of 12 g/dL or greater -high blood pressure -low levels of folate, iron, or vitamin B12 -seizures -an unusual or allergic reaction to darbepoetin, erythropoietin, albumin, hamster proteins, latex, other medicines, foods, dyes, or preservatives -pregnant or trying to get pregnant -breast-feeding How should I use this medicine? This medicine is for injection into a vein or under the skin. It is usually given by a health care professional in a hospital or clinic setting. If you get this medicine at home, you will be taught how to prepare and give this medicine. Do not shake the solution before you withdraw a dose. Use exactly as directed. Take your medicine at regular intervals. Do not take your medicine more often than directed. It is important that you put your used needles and syringes in a special sharps container. Do not put them in a trash can. If you do not have a sharps container, call your pharmacist or healthcare provider to get one. Talk to your pediatrician regarding the use of this medicine in children. While this medicine may be used in children as young as 1 year for selected conditions, precautions do apply. Overdosage: If you think you have taken too much of this medicine contact a poison control center or emergency room at once. NOTE:  This medicine is only for you. Do not share this medicine with others. What if I miss a dose? If you miss a dose, take it as soon as you can. If it is almost time for your next dose, take only that dose. Do not take double or extra doses. What may interact with this medicine? Do not take this medicine with any of the following medications: -epoetin alfa This list may not describe all possible interactions. Give your health care provider a list of all the medicines, herbs, non-prescription drugs, or dietary supplements you use. Also tell them if you smoke, drink alcohol, or use illegal drugs. Some items may interact with your medicine. What should I watch for while using this medicine? Visit your prescriber or health care professional for regular checks on your progress and for the needed blood tests and blood pressure measurements. It is especially important for the doctor to make sure your hemoglobin level is in the desired range, to limit the risk of potential side effects and to give you the best benefit. Keep all appointments for any recommended tests. Check your blood pressure as directed. Ask your doctor what your blood pressure should be and when you should contact him or her. As your body makes more red blood cells, you may need to take iron, folic acid, or vitamin B supplements. Ask your doctor or health care provider which products are right for you. If you have kidney disease continue dietary restrictions, even though this medication can make you feel better. Talk with your doctor or health care professional about the   foods you eat and the vitamins that you take. What side effects may I notice from receiving this medicine? Side effects that you should report to your doctor or health care professional as soon as possible: -allergic reactions like skin rash, itching or hives, swelling of the face, lips, or tongue -breathing problems -changes in vision -chest pain -confusion, trouble speaking  or understanding -feeling faint or lightheaded, falls -high blood pressure -muscle aches or pains -pain, swelling, warmth in the leg -rapid weight gain -severe headaches -sudden numbness or weakness of the face, arm or leg -trouble walking, dizziness, loss of balance or coordination -seizures (convulsions) -swelling of the ankles, feet, hands -unusually weak or tired Side effects that usually do not require medical attention (report to your doctor or health care professional if they continue or are bothersome): -diarrhea -fever, chills (flu-like symptoms) -headaches -nausea, vomiting -redness, stinging, or swelling at site where injected This list may not describe all possible side effects. Call your doctor for medical advice about side effects. You may report side effects to FDA at 1-800-FDA-1088. Where should I keep my medicine? Keep out of the reach of children. Store in a refrigerator between 2 and 8 degrees C (36 and 46 degrees F). Do not freeze. Do not shake. Throw away any unused portion if using a single-dose vial. Throw away any unused medicine after the expiration date. NOTE: This sheet is a summary. It may not cover all possible information. If you have questions about this medicine, talk to your doctor, pharmacist, or health care provider.    2016, [employer]. (2008-01-04 10:23:57)  

## 2016-01-07 ENCOUNTER — Ambulatory Visit (HOSPITAL_COMMUNITY)
Admission: RE | Admit: 2016-01-07 | Discharge: 2016-01-07 | Disposition: A | Discharge status: Home / Self Care | Payer: Medicaid Other | Source: Ambulatory Visit | Attending: Cardiology | Admitting: Cardiology

## 2016-01-07 VITALS — BP 184/80 | HR 78 | Wt 217.2 lb

## 2016-01-07 DIAGNOSIS — E039 Hypothyroidism, unspecified: Secondary | ICD-10-CM | POA: Insufficient documentation

## 2016-01-07 DIAGNOSIS — Z951 Presence of aortocoronary bypass graft: Secondary | ICD-10-CM

## 2016-01-07 DIAGNOSIS — N184 Chronic kidney disease, stage 4 (severe): Secondary | ICD-10-CM | POA: Insufficient documentation

## 2016-01-07 DIAGNOSIS — I5032 Chronic diastolic (congestive) heart failure: Secondary | ICD-10-CM | POA: Diagnosis present

## 2016-01-07 DIAGNOSIS — Z87891 Personal history of nicotine dependence: Secondary | ICD-10-CM | POA: Diagnosis not present

## 2016-01-07 DIAGNOSIS — I739 Peripheral vascular disease, unspecified: Secondary | ICD-10-CM | POA: Insufficient documentation

## 2016-01-07 DIAGNOSIS — Z7982 Long term (current) use of aspirin: Secondary | ICD-10-CM | POA: Insufficient documentation

## 2016-01-07 DIAGNOSIS — E1122 Type 2 diabetes mellitus with diabetic chronic kidney disease: Secondary | ICD-10-CM | POA: Insufficient documentation

## 2016-01-07 DIAGNOSIS — J449 Chronic obstructive pulmonary disease, unspecified: Secondary | ICD-10-CM | POA: Diagnosis not present

## 2016-01-07 DIAGNOSIS — Z9981 Dependence on supplemental oxygen: Secondary | ICD-10-CM | POA: Insufficient documentation

## 2016-01-07 DIAGNOSIS — I130 Hypertensive heart and chronic kidney disease with heart failure and stage 1 through stage 4 chronic kidney disease, or unspecified chronic kidney disease: Secondary | ICD-10-CM | POA: Insufficient documentation

## 2016-01-07 DIAGNOSIS — Z8249 Family history of ischemic heart disease and other diseases of the circulatory system: Secondary | ICD-10-CM | POA: Insufficient documentation

## 2016-01-07 DIAGNOSIS — G4733 Obstructive sleep apnea (adult) (pediatric): Secondary | ICD-10-CM | POA: Diagnosis not present

## 2016-01-07 DIAGNOSIS — I2510 Atherosclerotic heart disease of native coronary artery without angina pectoris: Secondary | ICD-10-CM | POA: Diagnosis not present

## 2016-01-07 DIAGNOSIS — E785 Hyperlipidemia, unspecified: Secondary | ICD-10-CM | POA: Diagnosis not present

## 2016-01-07 DIAGNOSIS — Z794 Long term (current) use of insulin: Secondary | ICD-10-CM | POA: Insufficient documentation

## 2016-01-07 DIAGNOSIS — Z79899 Other long term (current) drug therapy: Secondary | ICD-10-CM | POA: Diagnosis not present

## 2016-01-07 DIAGNOSIS — Z7951 Long term (current) use of inhaled steroids: Secondary | ICD-10-CM | POA: Insufficient documentation

## 2016-01-07 MED ORDER — NIFEDIPINE ER OSMOTIC RELEASE 60 MG PO TB24: 120.0000 mg | ORAL_TABLET | Freq: Every day | ORAL | 3 refills | Status: DC

## 2016-01-07 MED ORDER — DOXAZOSIN MESYLATE 8 MG PO TABS: 8.0000 mg | ORAL_TABLET | Freq: Two times a day (BID) | ORAL | 6 refills | Status: DC

## 2016-01-07 NOTE — Progress Notes (Signed)
PCP: Dr. Juanito Doom Cardiology: Dr. Aundra Dubin  65 yo with history of CAD s/p CABG, PAD, chronic diastolic CHF, CKD stage IV, COPD on home oxygen, and HTN presents for cardiology followup.  He has had multiple admissions this year for diastolic CHF.  Last echo in 5/17 showed EF 60-65% with grade II diastolic dysfunction.  He also has COPD with PFTs showing severe obstruction.  Last admission for CHF was in 7/17.  He is short of breath after walking about 75-100 yards, this is stable.  He is short of breath with stairs/inclines.  Weight is stable. He had an episode of chest pain while lying in bed about 2 wks ago but nothing since then.  He has left knee pain that is more limiting to his ambulation than claudication, which really does not seem prominent at this time. [facility] follows PAD.  He had been on torsemide 40 mg bid.  BP remains high.  He says he took all meds today.  SBP in 150s generally at home.  Labs (8/17): K 3.9, creatinine 2.33, HCT 28.5, LDL 46 Labs (11/17): hgb 9, K 4.4, creatinine 3, BNP 64  PMH: 1. Chronic diastolic CHF: Multiple recent admissions.  Echo (5/17) with EF 60-65%, grade II diastolic dysfunction, normal RV size and systolic function.  2. PAD: Right femoral PCI in 12/14. Peripheral arterial dopplers in 6/17 with occluded right SFA.  He follows with [facility].  3. HTN: Renal artery dopplers negative in 5/17.  4. Hypothyroidism 5. CKD stage IV: Follows with Dr. Marval Regal. 6. CAD: s/p CABG x 3 in 3/15.  7. OSA: Mild, not on CPAP.  8. COPD: PFTs (3/15) with FVC 77%, FEV1 47%, ratio 59%, TLC 55% => severe obstruction.  Prior smoker. He is on 2 L home O2.  9. 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 Hyperlipidemia 12. Anemia of renal disease  Social History   Social History  . Marital status: Single    Spouse name: N/A  . Number of children: N/A  . Years of education: N/A   Occupational History  . Not on file.   Social History Main Topics  . Smoking status: Former Smoker    Packs/day:  1.00    Years: 46.00    Types: Cigarettes    Start date: 02/04/1967    Quit date: 02/03/2013  . Smokeless tobacco: Never Used     Comment: Doing well with quitting.  . Alcohol use No  . Drug use: No  . Sexual activity: Not Currently   Other Topics Concern  . Not on file   Social History Narrative   Lives with sister, [name] in [place].  Retired - former Sports coach.   Family History  Problem Relation Age of Onset  . Cancer Mother     lung cancer  . Hypertension Mother   . Cancer Sister     patient thinks it was uterine cancer  . Hypertension Sister   . Heart attack Sister   . Stroke Neg Hx    ROS: All systems reviewed and negative except as per HPI.   Current Outpatient Prescriptions  Medication Sig Dispense Refill  . ADVAIR DISKUS 250-50 MCG/DOSE AEPB INHALE ONE PUFF INTO THE LUNGS TWO TIMESDAILY 60 each 3  . aspirin 81 MG tablet TAKE ONE (1) TABLET BY MOUTH EVERY DAY 30 tablet 3  . atorvastatin (LIPITOR) 40 MG tablet TAKE 1 TABLET BY MOUTH EVERY DAY AT 6 PM 90 tablet 3  . carvedilol (COREG) 25 MG tablet Take 2  tablets (50 mg total) by mouth 2 (two) times daily with a meal. 120 tablet 3  . cloNIDine (CATAPRES) 0.3 MG tablet Take 1 tablet (0.3 mg total) by mouth 2 (two) times daily. 60 tablet 3  . doxazosin (CARDURA) 8 MG tablet Take 1 tablet (8 mg total) by mouth 2 (two) times daily. 60 tablet 6  . hydrALAZINE (APRESOLINE) 100 MG tablet TAKE ONE TABLET BY MOUTH EVERY EIGHT HOURS 90 tablet 3  . insulin aspart (NOVOLOG FLEXPEN) 100 UNIT/ML FlexPen Inject 35 Units into the skin 3 (three) times daily with meals. 15 mL 0  . Insulin Glargine (LANTUS SOLOSTAR) 100 UNIT/ML Solostar Pen Inject 60 Units into the skin daily.    . isosorbide mononitrate (IMDUR) 60 MG 24 hr tablet TAKE ONE (1) TABLET BY MOUTH EVERY DAY 30 tablet 3  . levothyroxine (SYNTHROID, LEVOTHROID) 50 MCG tablet TAKE ONE (1) TABLET BY MOUTH EVERY DAY BEFORE BREAKFAST 30 tablet 3  . nitroGLYCERIN (NITROSTAT)  0.4 MG SL tablet Place 1 tablet (0.4 mg total) under the tongue every 5 (five) minutes as needed for chest pain. 30 tablet 12  . pantoprazole (PROTONIX) 40 MG tablet TAKE 1 TABLET BY MOUTH DAILY 30 tablet 3  . potassium chloride SA (K-DUR,KLOR-CON) 20 MEQ tablet Take 59meq (1 tablet ) in the AM and 78meq (1/2 tablet) in the PM 135 tablet 3  . PROVENTIL HFA 108 (90 Base) MCG/ACT inhaler INHALE 2 PUFFS INTO THE LUNGS EVERY 6 HOURS AS NEEDED FOR WHEEZING ORSHORTNESS OF BREATH 6.7 g 3  . SURE COMFORT PEN NEEDLES 31G X 8 MM MISC USE WITH BOTH INSULIN PENS TWICE DAILY (4 PER DAY) 100 each 5  . torsemide (DEMADEX) 20 MG tablet Take 2 tablets (40 mg total) by mouth 2 (two) times daily. 150 tablet 3  . NIFEdipine (PROCARDIA XL/ADALAT-CC) 60 MG 24 hr tablet Take 2 tablets (120 mg total) by mouth daily. 60 tablet 3   No current facility-administered medications for this encounter.      BP (!) 184/80   Pulse 78   Wt 217 lb 4 oz (98.5 kg)   SpO2 97%   BMI 33.03 kg/m  General: NAD Neck: JVP 7 cm, no thyromegaly or thyroid nodule.  Lungs: Distant BS bilaterally.  CV: Nondisplaced PMI.  Heart regular S1/S2, no S3/S4, no murmur.  No edema bilaterally.  No carotid bruit.  Unable to palpate pedal pulses.  Abdomen: Soft, nontender, no hepatosplenomegaly, no distention.  Skin: Intact without lesions or rashes.  Neurologic: Alert and oriented x 3.  Psych: Normal affect. Extremities: No clubbing or cyanosis. 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  HEENT: Normal.   Assessment/Plan: 1. Chronic diastolic CHF:  This is worsened by stage IV CKD.  He is not volume overloaded on exam.  NYHA class II-III symptoms, suspect COPD plays a role in this.  Multiple admissions this year.  - Continue torsemide 40 mg bid.  BMET today.  2. HTN: BP remains high despite taking all his meds.  - Stop amlodipine, start nifedipine XL 120 mg daily.  3. CAD: S/p CABG.  No chest pain.  Continue ASA 81 and statin.  Good lipids 8/17.    4. COPD: Severe by 3/15 PFTs.  He has quit smoking.  He is on home oxygen.  I suspect COPD plays a significant role in generating his dyspnea. I would like him to establish with pulmonary, will make referral.  5. CKD: Stage IV.  Sees Dr Marval Regal.  This significantly  complicates CHF treatment.   6. PAD: Not particularly limited by claudication now.  Saw [facility] 6/17, has followup.   Followup with PA in 1 month.   Loralie Champagne 01/07/2016

## 2016-01-07 NOTE — Patient Instructions (Signed)
Labs today (will call for abnormal results, otherwise no news is good news)  Start taking Nifedipine XL 120 mg (2 Tabs) Once Daily  You have been referred to Pulmonology for COPD, they will contact you for appointment.  Follow up in 1 month

## 2016-01-09 ENCOUNTER — Encounter: Payer: Self-pay | Admitting: Family Medicine

## 2016-01-09 ENCOUNTER — Ambulatory Visit (INDEPENDENT_AMBULATORY_CARE_PROVIDER_SITE_OTHER): Payer: Medicaid Other | Admitting: Family Medicine

## 2016-01-09 ENCOUNTER — Ambulatory Visit (HOSPITAL_COMMUNITY)
Admission: RE | Admit: 2016-01-09 | Discharge: 2016-01-09 | Disposition: A | Discharge status: Home / Self Care | Payer: Medicaid Other | Source: Ambulatory Visit | Attending: Family Medicine | Admitting: Family Medicine

## 2016-01-09 VITALS — BP 132/70 | HR 75 | Temp 98.3°F | Ht 68.0 in | Wt 221.0 lb

## 2016-01-09 DIAGNOSIS — N183 Chronic kidney disease, stage 3 unspecified: Secondary | ICD-10-CM

## 2016-01-09 DIAGNOSIS — R0602 Shortness of breath: Secondary | ICD-10-CM | POA: Diagnosis not present

## 2016-01-09 DIAGNOSIS — Z794 Long term (current) use of insulin: Secondary | ICD-10-CM

## 2016-01-09 DIAGNOSIS — R079 Chest pain, unspecified: Secondary | ICD-10-CM | POA: Diagnosis present

## 2016-01-09 DIAGNOSIS — I10 Essential (primary) hypertension: Secondary | ICD-10-CM

## 2016-01-09 DIAGNOSIS — R9431 Abnormal electrocardiogram [ECG] [EKG]: Secondary | ICD-10-CM | POA: Diagnosis not present

## 2016-01-09 DIAGNOSIS — E1121 Type 2 diabetes mellitus with diabetic nephropathy: Secondary | ICD-10-CM

## 2016-01-09 DIAGNOSIS — E1122 Type 2 diabetes mellitus with diabetic chronic kidney disease: Secondary | ICD-10-CM

## 2016-01-09 LAB — BASIC METABOLIC PANEL
BUN: 82 mg/dL — ABNORMAL HIGH (ref 7–25)
CHLORIDE: 102 mmol/L (ref 98–110)
CO2: 18 mmol/L — AB (ref 20–31)
Calcium: 9.1 mg/dL (ref 8.6–10.3)
Creat: 3.64 mg/dL — ABNORMAL HIGH (ref 0.70–1.25)
Glucose, Bld: 194 mg/dL — ABNORMAL HIGH (ref 65–99)
POTASSIUM: 4.5 mmol/L (ref 3.5–5.3)
Sodium: 136 mmol/L (ref 135–146)

## 2016-01-09 LAB — POCT GLYCOSYLATED HEMOGLOBIN (HGB A1C): HEMOGLOBIN A1C: 9.7

## 2016-01-09 MED ORDER — INSULIN ASPART 100 UNIT/ML FLEXPEN: 20.0000 [IU] | PEN_INJECTOR | Freq: Three times a day (TID) | SUBCUTANEOUS | 3 refills | Status: DC

## 2016-01-09 NOTE — Assessment & Plan Note (Signed)
Unlikely cardiac in origin as nature of pain does not match with ACS event. EKG in clinic with normal sinus rhythm, no ST changes, no arrhythmia. He saw his cardiologist on Monday. Only has discomfort when he takes a deep breath in. Suspect this is likely MSK versus GERD. -Tylenol when necessary -Follow-up in 2 days -Strict return precautions given

## 2016-01-09 NOTE — Progress Notes (Signed)
Subjective:    Patient ID: Leonard [name] , male   DOB: 26-May-1950 , 65 y.o..   MRN: [id number]  HPI  Leonard [name] is here for  Chief Complaint  Patient presents with  . Diabetes  . Shortness of Breath    x 1 day   Hypertension Patient was seen by his cardiologist on December 4 and was started on nifedipine XL 120 mg daily, amlodipine was stopped. This was because his blood pressure continued to be high. Torsemide 40 mg twice a day was continued. He was also referred to pulmonologist for COPD. Blood pressure at home: Does not know Exercise: No Low salt diet: Sometimes compliant Medications: Compliant with all his medications Side effects: None ROS: Denies headache, dizziness, visual changes, nausea, vomiting, abdominal pain. BP Readings from Last 3 Encounters:  01/09/16 132/70  01/07/16 (!) 184/80  01/03/16 (!) 106/52     Shortness of Breath and Chest Tightness: Patient states that for the last day and a half he has been experiencing shortness of breath and chest tightness. He notes that the chest tightness is on the left side of his chest and it is pleuritic in nature. Chest tightness is nonradiating. No alleviating or aggravating factors identified. He also notes that he has had increased shortness of breath that is not worse with ambulation. He notes that he cannot lay flat on his back without feeling short of breath. He falls asleep in his chair sitting up because that is most comfortable for him. He has been taking 80 mg of torsemide daily. No increased cough.   Chronic Diabetes  Disease Monitoring  Blood Sugar Ranges: checks every morning, 300's glucose  Polyuria: no   Visual problems: no   Last hemoglobin A1C:  Lab Results  Component Value Date   HGBA1C 9.7 01/09/2016    Medication Compliance: yes: Lantus 60 U bidand Novolog 35U once daily Medication Side Effects  Hypoglycemia: no   Preventitive Health Care  Eye Exam: due for an eye exam  Foot Exam:  going to podiatrist Friday  Diet pattern: Sometimes compliant with diabetic diet  Exercise: None   Review of Systems: Per HPI. All other systems reviewed and are negative.  Past Medical History: Patient Active Problem List   Diagnosis Date Noted  . Diabetic retinopathy ([place]) 11/13/2015  . Hypertensive urgency 11/07/2015  . Essential hypertension, benign 10/23/2015  . CHF (congestive heart failure) ([place]) 08/27/2015  . Shortness of breath 08/27/2015  . Iron deficiency anemia 08/03/2015  . Claudication ([employer])   . Type 2 diabetes mellitus with stage 3 chronic kidney disease, with long-term current use of insulin ([place])   . Hematuria 06/27/2015  . Knee pain   . Chest pain 06/03/2015  . Skin lesion of face 02/22/2015  . 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 Anemia in chronic kidney disease 01/15/2015  . CKD (chronic kidney disease) stage 4, GFR 15-29 ml/min ([facility]) 01/15/2015  . Hypothyroidism 11/22/2014  . Health care maintenance 11/22/2014  . COPD (chronic obstructive pulmonary disease) ([place]) 02/16/2014  . History of tobacco use 08/23/2013  . 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 CAD (coronary artery disease) 04/06/2013  . PVD - hx of Rt SFA PTA and s/p Rt 4-5th toe amp Dec 2014 04/05/2013  . Atherosclerosis of native arteries of the extremities with gangrene ([place]) 02/15/2013  . 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 Mixed hyperlipidemia 07/17/2008    Medications: reviewed and updated Current Outpatient Prescriptions  Medication Sig Dispense  Refill  . ADVAIR DISKUS 250-50 MCG/DOSE AEPB INHALE ONE PUFF INTO THE LUNGS TWO TIMESDAILY 60 each 3  . aspirin 81 MG tablet TAKE ONE (1) TABLET BY MOUTH EVERY DAY 30 tablet 3  . atorvastatin (LIPITOR) 40 MG tablet TAKE 1 TABLET BY MOUTH EVERY DAY AT 6 PM 90 tablet 3  . carvedilol (COREG) 25 MG tablet Take 2 tablets (50 mg total) by mouth 2 (two) times daily with a meal. 120 tablet 3  . cloNIDine (CATAPRES) 0.3 MG tablet Take 1  tablet (0.3 mg total) by mouth 2 (two) times daily. 60 tablet 3  . doxazosin (CARDURA) 8 MG tablet Take 1 tablet (8 mg total) by mouth 2 (two) times daily. 60 tablet 6  . hydrALAZINE (APRESOLINE) 100 MG tablet TAKE ONE TABLET BY MOUTH EVERY EIGHT HOURS 90 tablet 3  . insulin aspart (NOVOLOG FLEXPEN) 100 UNIT/ML FlexPen Inject 20 Units into the skin 3 (three) times daily with meals. 15 mL 3  . Insulin Glargine (LANTUS SOLOSTAR) 100 UNIT/ML Solostar Pen Inject 60 Units into the skin daily.    . isosorbide mononitrate (IMDUR) 60 MG 24 hr tablet TAKE ONE (1) TABLET BY MOUTH EVERY DAY 30 tablet 3  . levothyroxine (SYNTHROID, LEVOTHROID) 50 MCG tablet TAKE ONE (1) TABLET BY MOUTH EVERY DAY BEFORE BREAKFAST 30 tablet 3  . NIFEdipine (PROCARDIA XL/ADALAT-CC) 60 MG 24 hr tablet Take 2 tablets (120 mg total) by mouth daily. 60 tablet 3  . nitroGLYCERIN (NITROSTAT) 0.4 MG SL tablet Place 1 tablet (0.4 mg total) under the tongue every 5 (five) minutes as needed for chest pain. 30 tablet 12  . pantoprazole (PROTONIX) 40 MG tablet TAKE 1 TABLET BY MOUTH DAILY 30 tablet 3  . potassium chloride SA (K-DUR,KLOR-CON) 20 MEQ tablet Take 41meq (1 tablet ) in the AM and 49meq (1/2 tablet) in the PM 135 tablet 3  . PROVENTIL HFA 108 (90 Base) MCG/ACT inhaler INHALE 2 PUFFS INTO THE LUNGS EVERY 6 HOURS AS NEEDED FOR WHEEZING ORSHORTNESS OF BREATH 6.7 g 3  . SURE COMFORT PEN NEEDLES 31G X 8 MM MISC USE WITH BOTH INSULIN PENS TWICE DAILY (4 PER DAY) 100 each 5  . torsemide (DEMADEX) 20 MG tablet Take 2 tablets (40 mg total) by mouth 2 (two) times daily. 150 tablet 3   No current facility-administered medications for this visit.     Social Hx:  reports that he quit smoking about 2 years ago. His smoking use included Cigarettes. He started smoking about 48 years ago. He has a 46.00 pack-year smoking history. He has never used smokeless tobacco.   Objective:   BP 132/70   Pulse 75   Temp 98.3 F (36.8 C) (Oral)   Ht 5'  8" (1.727 m)   Wt 221 lb (100.2 kg)   SpO2 97%   BMI 33.60 kg/m  Physical Exam  Gen: NAD, alert, cooperative with exam, well-appearing HEENT: NCAT, PERRL, clear conjunctiva, oropharynx clear, supple neck Cardiac: Regular rate and rhythm, normal S1/S2, no murmur, capillary refill brisk , trace edema in bilateral ankles Respiratory: Clear to auscultation bilaterally, no wheezes, non-labored breathing, distant breath sounds Gastrointestinal: soft, non tender, non distended, bowel sounds present Skin: no rashes, normal turgor  Neurological: no gross deficits.  Psych: good insight, normal mood and affect  Assessment & Plan:  Resistant hypertension Controlled and at goal. BP 132/70.  -Continue current medications: Carvedilol 50 mg twice a day, clonidine 0.3 mg twice a day, doxazosin 2 mg twice  a day, hydralazine 100 mg 3 times a day, nifedipine 120 mg daily   Shortness of breath Also associated with chest pain. EKG in office showing no arrhythmia or ST changes. Physical exam not consistent with CHF or COPD exacerbation. Oxygen saturation in the high 90s at rest and with ambulation. Patient admits he has not been compliant with his COPD inhalers which could be causing his symptoms. -Emphasized to patient that he needs to take his COPD medications as prescribed -Ordered BNP and BMP -We'll keep heart failure medications as prescribed -Follow-up in 2 days  Chest pain Unlikely cardiac in origin as nature of pain does not match with ACS event. EKG in clinic with normal sinus rhythm, no ST changes, no arrhythmia. He saw his cardiologist on Monday. Only has discomfort when he takes a deep breath in. Suspect this is likely MSK versus GERD. -Tylenol when necessary -Follow-up in 2 days -Strict return precautions given  Type 2 diabetes mellitus with stage 3 chronic kidney disease, with long-term current use of insulin ([facility]CC) Uncontrolled. Hemoglobin A1c increased to 9.7 today. Patient admits he has  not been compliant with his medication regimen. -Continue Lantus at 60 units daily -NovoLog 20 units 3 times a day -Patient will check glucose each morning -Referral placed for diabetic eye exam -Patient going to podiatrist tomorrow for foot exam -Recheck A1c in 3 months   Smitty Cords, MD [place], PGY-2

## 2016-01-09 NOTE — Assessment & Plan Note (Addendum)
Uncontrolled. Hemoglobin A1c increased to 9.7 today. Patient admits he has not been compliant with his medication regimen. -Continue Lantus at 60 units daily -NovoLog 20 units 3 times a day -Patient will check glucose each morning -Referral placed for diabetic eye exam -Patient going to podiatrist tomorrow for foot exam -Recheck A1c in 3 months

## 2016-01-09 NOTE — Patient Instructions (Addendum)
Thank you for coming in today, it was so nice to see you! Today we talked about:    Shortness of breath and chest tightness: Please continue taking your COPD medications as prescribed. We will need to see you in 2 days to follow-up on this. He developed worsening shortness of breath or chest pain please go to emergency Department.  Diabetes; please take 60 units of Lantus a day and 20 units of NovoLog 3 times daily with meals.  You will need to see an eye doctor as part of her diabetes well, I have placed referral for this. Someone will call you to schedule sputum.  Your blood pressure looks great!!  Please follow up in 2 days. You can schedule this appointment at the front desk before you leave or call the clinic.  Bring in all your medications or supplements to each appointment for review.   If we ordered any tests today, you will be notified via telephone of any abnormalities. If everything is normal you will get a letter in the mail.   If you have any questions or concerns, please do not hesitate to call the office at [phone]. You can also message me directly via MyChart.   Sincerely,  Smitty Cords, MD

## 2016-01-09 NOTE — Assessment & Plan Note (Addendum)
Also associated with chest pain. EKG in office showing no arrhythmia or ST changes. Physical exam not consistent with CHF or COPD exacerbation. Oxygen saturation in the high 90s at rest and with ambulation. Patient admits he has not been compliant with his COPD inhalers which could be causing his symptoms. -Emphasized to patient that he needs to take his COPD medications as prescribed -Ordered BNP and BMP -We'll keep heart failure medications as prescribed -Follow-up in 2 days

## 2016-01-09 NOTE — Assessment & Plan Note (Deleted)
Controlled and at goal. Continue current medications

## 2016-01-09 NOTE — Assessment & Plan Note (Signed)
Controlled and at goal. BP 132/70.  -Continue current medications: Carvedilol 50 mg twice a day, clonidine 0.3 mg twice a day, doxazosin 2 mg twice a day, hydralazine 100 mg 3 times a day, nifedipine 120 mg daily

## 2016-01-10 LAB — BRAIN NATRIURETIC PEPTIDE: Brain Natriuretic Peptide: 224.5 pg/mL — ABNORMAL HIGH

## 2016-01-11 ENCOUNTER — Encounter: Payer: Self-pay | Admitting: Family Medicine

## 2016-01-11 ENCOUNTER — Encounter: Payer: Self-pay | Admitting: Podiatry

## 2016-01-11 ENCOUNTER — Telehealth: Payer: Self-pay | Admitting: Family Medicine

## 2016-01-11 ENCOUNTER — Ambulatory Visit (INDEPENDENT_AMBULATORY_CARE_PROVIDER_SITE_OTHER): Payer: Medicaid Other | Admitting: Family Medicine

## 2016-01-11 ENCOUNTER — Ambulatory Visit (INDEPENDENT_AMBULATORY_CARE_PROVIDER_SITE_OTHER): Payer: Medicaid Other | Admitting: Podiatry

## 2016-01-11 VITALS — Ht 68.0 in | Wt 221.0 lb

## 2016-01-11 DIAGNOSIS — Z794 Long term (current) use of insulin: Secondary | ICD-10-CM

## 2016-01-11 DIAGNOSIS — B351 Tinea unguium: Secondary | ICD-10-CM | POA: Diagnosis not present

## 2016-01-11 DIAGNOSIS — E1121 Type 2 diabetes mellitus with diabetic nephropathy: Secondary | ICD-10-CM

## 2016-01-11 DIAGNOSIS — N184 Chronic kidney disease, stage 4 (severe): Secondary | ICD-10-CM | POA: Diagnosis not present

## 2016-01-11 DIAGNOSIS — R0602 Shortness of breath: Secondary | ICD-10-CM

## 2016-01-11 DIAGNOSIS — M79676 Pain in unspecified toe(s): Secondary | ICD-10-CM

## 2016-01-11 DIAGNOSIS — I739 Peripheral vascular disease, unspecified: Secondary | ICD-10-CM

## 2016-01-11 NOTE — Progress Notes (Signed)
Subjective:    Patient ID: Leonard [name] , male   DOB: December 03, 1950 , 65 y.o..   MRN: [id number]  HPI  Leonard [name] is a 65 yo male with significant PMH of CHF, T2DM, CKD stage 4, COPD, and HTN is here for follow up.  Shortness of Breath and Chest Tightness follow up: Patient was seen on 12/6 for atypical chest pain and shortness of breath. His EKG at the time showed no arrhythmia and no significant ST changes. He did not appear to be in clinical CHF exacerbation. Discussed patient with Dr. Andria Frames. He was instructed to use is COPD medications as prescribed. Labs were drawn (BNP and BMP). BMP showing normal electrolytes but increase in Creatinine to 3.64. Recent Cr baseline has been 2.5-3.0. BNP was stable at 224. Today the patient states that his symptoms are slightly better. He reports that he has been taking his COPD medications as prescribed. Denies any worsening of his shortness of breath or chest pain. Denies any fever, chills, nausea, vomiting, edema.  CKD (chronic kidney disease) stage 4, GFR 15-29 ml/min [facility] Patient follows with nephrology. His next nephrology appointment is in January with Dr. Jeanella Anton. He reports no difficulties with making urine.  Review of Systems: Per HPI. All other systems reviewed and are negative.  Past Medical History: Patient Active Problem List   Diagnosis Date Noted  . UTI (urinary tract infection) 01/14/2016  . Hypothermia 01/14/2016  . Diabetic retinopathy ([place]) 11/13/2015  . Hypertensive urgency 11/07/2015  . Essential hypertension, benign 10/23/2015  . CHF (congestive heart failure) ([place]) 08/27/2015  . Shortness of breath 08/27/2015  . Iron deficiency anemia 08/03/2015  . Claudication ([place])   . Type 2 diabetes mellitus with stage 3 chronic kidney disease, with long-term current use of insulin ([place])   . Hematuria 06/27/2015  . Knee pain   . Chest pain 06/03/2015  . Skin lesion of face 02/22/2015  . MGUS (monoclonal gammopathy of  unknown significance) 01/15/2015  . Anemia in chronic kidney disease 01/15/2015  . CKD (chronic kidney disease) stage 4, GFR 15-29 ml/min (HCC) 01/15/2015  . Hypothyroidism 11/22/2014  . Health care maintenance 11/22/2014  . COPD (chronic obstructive pulmonary disease) ([place]) 02/16/2014  . History of tobacco use 08/23/2013  . 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 CAD (coronary artery disease) 04/06/2013  . PVD - hx of Rt SFA PTA and s/p Rt 4-5th toe amp Dec 2014 04/05/2013  . Atherosclerosis of native arteries of the extremities with gangrene ([place]) 02/15/2013  . 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 Mixed hyperlipidemia 07/17/2008    Medications: reviewed and updated No current facility-administered medications for this visit.    No current outpatient prescriptions on file.   Facility-Administered Medications Ordered in Other Visits  Medication Dose Route Frequency Provider Last Rate Last Dose  . albuterol (PROVENTIL) (2.5 MG/3ML) 0.083% nebulizer solution 2.5 mg  2.5 mg Inhalation Q4H PRN Verner Mould, MD      . aspirin EC tablet 81 mg  81 mg Oral Daily Verner Mould, MD   81 mg at 01/14/16 0945  . atorvastatin (LIPITOR) tablet 40 mg  40 mg Oral q1800 Verner Mould, MD      . carvedilol (COREG) tablet 50 mg  50 mg Oral BID WC Verner Mould, MD   50 mg at 01/14/16 0818  . cloNIDine (CATAPRES) tablet 0.3 mg  0.3 mg Oral BID Verner Mould, MD   0.3 mg at 01/14/16 0944  .  doxazosin (CARDURA) tablet 8 mg  8 mg Oral BID Verner Mould, MD   8 mg at 01/14/16 0944  . heparin injection 5,000 Units  5,000 Units Subcutaneous Q8H Verner Mould, MD      . hydrALAZINE (APRESOLINE) tablet 100 mg  100 mg Oral Q8H Verner Mould, MD   100 mg at 01/14/16 5974  . insulin aspart (novoLOG) injection 0-5 Units  0-5 Units Subcutaneous QHS Verner Mould, MD      . insulin aspart (novoLOG) injection 0-9 Units  0-9 Units  Subcutaneous TID WC Verner Mould, MD      . insulin glargine (LANTUS) injection 30 Units  30 Units Subcutaneous QHS Verner Mould, MD      . isosorbide mononitrate (IMDUR) 24 hr tablet 60 mg  60 mg Oral Daily Verner Mould, MD   60 mg at 01/14/16 0944  . levothyroxine (SYNTHROID, LEVOTHROID) tablet 50 mcg  50 mcg Oral QAC breakfast Verner Mould, MD   50 mcg at 01/14/16 0818  . mometasone-formoterol (DULERA) 200-5 MCG/ACT inhaler 2 puff  2 puff Inhalation BID Verner Mould, MD      . NIFEdipine (PROCARDIA-XL/ADALAT CC) 24 hr tablet 120 mg  120 mg Oral Daily Verner Mould, MD   120 mg at 01/14/16 0944  . pantoprazole (PROTONIX) EC tablet 40 mg  40 mg Oral Daily Verner Mould, MD   40 mg at 01/14/16 0818  . piperacillin-tazobactam (ZOSYN) IVPB 3.375 g  3.375 g Intravenous Q8H Erenest Blank, RPH   3.375 g at 01/14/16 1638  . potassium chloride SA (K-DUR,KLOR-CON) CR tablet 20 mEq  20 mEq Oral BID Verner Mould, MD   20 mEq at 01/14/16 0944  . sodium chloride flush (NS) 0.9 % injection 3 mL  3 mL Intravenous Q12H Verner Mould, MD   3 mL at 01/14/16 0952  . torsemide (DEMADEX) tablet 40 mg  40 mg Oral BID Verner Mould, MD   40 mg at 01/14/16 0818  . [START ON 01/15/2016] vancomycin (VANCOCIN) 1,250 mg in sodium chloride 0.9 % 250 mL IVPB  1,250 mg Intravenous Q48H Erenest Blank, RPH        Social Hx:  reports that he quit smoking about 2 years ago. His smoking use included Cigarettes. He started smoking about 48 years ago. He has a 46.00 pack-year smoking history. He has never used smokeless tobacco.   Objective:   BP 120/80   Pulse 64   Temp 98.6 F (37 C) (Oral)   Ht 5\' 8"  (1.727 m)   Wt 222 lb (100.7 kg)   SpO2 98%   BMI 33.75 kg/m  Physical Exam  Gen: NAD, alert, cooperative with exam, well-appearing HEENT: NCAT, PERRL, clear conjunctiva, oropharynx clear, supple  neck Cardiac: Regular rate and rhythm, normal S1/S2, no murmur, Trace edema bilateral ankles, capillary refill brisk  Respiratory: Clear to auscultation bilaterally, no wheezes, non-labored breathing, No crackles Gastrointestinal: soft, non tender, non distended, bowel sounds present Skin: no rashes, normal turgor  Neurological: no gross deficits.  Psych: good insight, normal mood and affect   Assessment & Plan:  Shortness of breath Chest pain shortness of breath have both improved since being seen last. He has been taking his COPD medications as prescribed. Vital signs within normal limits. Still no signs of CHF exacerbation. BNP from 2 days ago only slightly elevated, not within exacerbation range. Does not appear fluid overloaded on exam. -Continue heart  failure medications as prescribed -Follow-up in one week  CKD (chronic kidney disease) stage 4, GFR 15-29 ml/min (HCC) Creatinine seems to be creeping up over last couple months. Baseline was around 2.5-3. Creatinine 2 days ago was 3.64. At this time I'm hesitant to decrease torsemide dose as he has significant CHF exacerbation history requiring hospitalization. Discussed patient with Dr. Nori Riis about patient's kidney function. Discussed pros and cons with patient and the sister of keeping torsemide dose the same. -Continue torsemide 80 mg daily -Continue to monitor kidney function, follow-up in one week for repeat BMP -Follow-up with nephrology    Smitty Cords, MD [place], PGY-2

## 2016-01-11 NOTE — Patient Instructions (Signed)
Thank you for coming in today, it was so nice to see you! Today we talked about:    Your kidney function is slightly worse. We will need to monitor this closely. I do not want to change your Torsemide dose at this time. You will need to keep your appointments with your heart and kidney doctors.   Your blood pressure looks excellent today, continue the great work.   Please follow up next week to recheck your kidneys. You can schedule this appointment at the front desk before you leave or call the clinic.  Bring in all your medications or supplements to each appointment for review.   If we ordered any tests today, you will be notified via telephone of any abnormalities. If everything is normal you will get a letter in the mail.   If you have any questions or concerns, please do not hesitate to call the office at [phone]. You can also message me directly via MyChart.   Sincerely,  Smitty Cords, MD

## 2016-01-11 NOTE — Progress Notes (Signed)
Patient ID: Leonard [name], male   DOB: 06/04/1950, 65 y.o.   MRN: [id number] Complaint:  Visit Type: Patient returns to my office for continued preventative foot care services. Complaint: Patient states" my nails have grown long and thick and become painful to walk and wear shoes" Patient has been diagnosed with DM with amputation 4,5 rays right foot... The patient presents for preventative foot care services. No changes to ROS  Podiatric Exam: Vascular: dorsalis pedis and posterior tibial pulses are not  palpable bilateral. Capillary return is immediate.  Cold feet Absence of hair.  Sensorium: Absent  Semmes Weinstein monofilament test. Normal tactile sensation bilaterally. Nail Exam: Pt has thick disfigured discolored nails with subungual debris noted bilateral entire nail hallux through fifth toenails Ulcer Exam: There is no evidence of ulcer or pre-ulcerative changes or infection. Orthopedic Exam: Muscle tone and strength are WNL. No limitations in general ROM. No crepitus or effusions noted. Foot type and digits show no abnormalities. Bony prominences are unremarkable.Amputation 4,5 digits right foot. Skin: Asymptomatic  Porokeratosis left foot.. No infection or ulcers  Diagnosis:  Onychomycosis, , Pain in right toe, pain in left toes  Treatment & Plan Procedures and Treatment: Consent by patient was obtained for treatment procedures. The patient understood the discussion of treatment and procedures well. All questions were answered thoroughly reviewed. Debridement of mycotic and hypertrophic toenails, 1 through 5 bilateral and clearing of subungual debris. No ulceration, no infection noted. Requests no drill usage.   Return Visit-Office Procedure: Patient instructed to return to the office for a follow up visit 3 months for continued evaluation and treatment.    Gardiner Barefoot DPM

## 2016-01-11 NOTE — Telephone Encounter (Signed)
Attempted to call patient three times. Line is busy. He was supposed to follow up with me today but did no show up. Patient's Creatinine is showing an elevation. Would like to discuss this with him and see how he is feeling. If he by chance calls the clinic, please tell him I would like to see him ASAP, preferably on 12/11. Will try calling again on Monday.

## 2016-01-13 ENCOUNTER — Other Ambulatory Visit: Payer: Self-pay

## 2016-01-13 ENCOUNTER — Inpatient Hospital Stay (HOSPITAL_COMMUNITY)
Admission: EM | Admit: 2016-01-13 | Discharge: 2016-01-18 | DRG: 690 | Disposition: A | Discharge status: Home / Self Care | Payer: Medicaid Other | Attending: Family Medicine | Admitting: Family Medicine

## 2016-01-13 ENCOUNTER — Encounter (HOSPITAL_COMMUNITY): Payer: Self-pay | Admitting: Emergency Medicine

## 2016-01-13 DIAGNOSIS — N3000 Acute cystitis without hematuria: Secondary | ICD-10-CM

## 2016-01-13 DIAGNOSIS — E1151 Type 2 diabetes mellitus with diabetic peripheral angiopathy without gangrene: Secondary | ICD-10-CM | POA: Diagnosis present

## 2016-01-13 DIAGNOSIS — E860 Dehydration: Secondary | ICD-10-CM | POA: Diagnosis present

## 2016-01-13 DIAGNOSIS — I130 Hypertensive heart and chronic kidney disease with heart failure and stage 1 through stage 4 chronic kidney disease, or unspecified chronic kidney disease: Secondary | ICD-10-CM | POA: Diagnosis present

## 2016-01-13 DIAGNOSIS — Z951 Presence of aortocoronary bypass graft: Secondary | ICD-10-CM

## 2016-01-13 DIAGNOSIS — D631 Anemia in chronic kidney disease: Secondary | ICD-10-CM | POA: Diagnosis present

## 2016-01-13 DIAGNOSIS — E11319 Type 2 diabetes mellitus with unspecified diabetic retinopathy without macular edema: Secondary | ICD-10-CM | POA: Diagnosis present

## 2016-01-13 DIAGNOSIS — I2510 Atherosclerotic heart disease of native coronary artery without angina pectoris: Secondary | ICD-10-CM | POA: Diagnosis present

## 2016-01-13 DIAGNOSIS — J449 Chronic obstructive pulmonary disease, unspecified: Secondary | ICD-10-CM | POA: Diagnosis present

## 2016-01-13 DIAGNOSIS — Z85828 Personal history of other malignant neoplasm of skin: Secondary | ICD-10-CM

## 2016-01-13 DIAGNOSIS — N184 Chronic kidney disease, stage 4 (severe): Secondary | ICD-10-CM | POA: Diagnosis present

## 2016-01-13 DIAGNOSIS — I5032 Chronic diastolic (congestive) heart failure: Secondary | ICD-10-CM | POA: Diagnosis present

## 2016-01-13 DIAGNOSIS — E782 Mixed hyperlipidemia: Secondary | ICD-10-CM | POA: Diagnosis present

## 2016-01-13 DIAGNOSIS — R680 Hypothermia, not associated with low environmental temperature: Secondary | ICD-10-CM | POA: Diagnosis present

## 2016-01-13 DIAGNOSIS — D472 Monoclonal gammopathy: Secondary | ICD-10-CM | POA: Diagnosis present

## 2016-01-13 DIAGNOSIS — Z8249 Family history of ischemic heart disease and other diseases of the circulatory system: Secondary | ICD-10-CM

## 2016-01-13 DIAGNOSIS — N179 Acute kidney failure, unspecified: Secondary | ICD-10-CM | POA: Diagnosis present

## 2016-01-13 DIAGNOSIS — R400 Somnolence: Secondary | ICD-10-CM

## 2016-01-13 DIAGNOSIS — E039 Hypothyroidism, unspecified: Secondary | ICD-10-CM | POA: Diagnosis present

## 2016-01-13 DIAGNOSIS — E872 Acidosis: Secondary | ICD-10-CM | POA: Diagnosis present

## 2016-01-13 DIAGNOSIS — N189 Chronic kidney disease, unspecified: Secondary | ICD-10-CM

## 2016-01-13 DIAGNOSIS — E1122 Type 2 diabetes mellitus with diabetic chronic kidney disease: Secondary | ICD-10-CM | POA: Diagnosis present

## 2016-01-13 DIAGNOSIS — Z794 Long term (current) use of insulin: Secondary | ICD-10-CM

## 2016-01-13 DIAGNOSIS — Z8049 Family history of malignant neoplasm of other genital organs: Secondary | ICD-10-CM

## 2016-01-13 DIAGNOSIS — T68XXXA Hypothermia, initial encounter: Secondary | ICD-10-CM | POA: Diagnosis present

## 2016-01-13 DIAGNOSIS — Z7982 Long term (current) use of aspirin: Secondary | ICD-10-CM

## 2016-01-13 DIAGNOSIS — Z89421 Acquired absence of other right toe(s): Secondary | ICD-10-CM

## 2016-01-13 DIAGNOSIS — Z95820 Peripheral vascular angioplasty status with implants and grafts: Secondary | ICD-10-CM

## 2016-01-13 DIAGNOSIS — Z801 Family history of malignant neoplasm of trachea, bronchus and lung: Secondary | ICD-10-CM

## 2016-01-13 DIAGNOSIS — N390 Urinary tract infection, site not specified: Principal | ICD-10-CM | POA: Diagnosis present

## 2016-01-13 DIAGNOSIS — B9620 Unspecified Escherichia coli [E. coli] as the cause of diseases classified elsewhere: Secondary | ICD-10-CM | POA: Diagnosis present

## 2016-01-13 DIAGNOSIS — Z87891 Personal history of nicotine dependence: Secondary | ICD-10-CM

## 2016-01-13 DIAGNOSIS — R197 Diarrhea, unspecified: Secondary | ICD-10-CM | POA: Diagnosis not present

## 2016-01-13 LAB — HEPATIC FUNCTION PANEL
ALT: 20 U/L (ref 17–63)
AST: 33 U/L (ref 15–41)
Albumin: 3.6 g/dL (ref 3.5–5.0)
Alkaline Phosphatase: 102 U/L (ref 38–126)
BILIRUBIN INDIRECT: 0.3 mg/dL (ref 0.3–0.9)
Bilirubin, Direct: 0.1 mg/dL (ref 0.1–0.5)
TOTAL PROTEIN: 7.1 g/dL (ref 6.5–8.1)
Total Bilirubin: 0.4 mg/dL (ref 0.3–1.2)

## 2016-01-13 LAB — CBC
HEMATOCRIT: 28.7 % — AB (ref 39.0–52.0)
HEMOGLOBIN: 9 g/dL — AB (ref 13.0–17.0)
MCH: 22.7 pg — AB (ref 26.0–34.0)
MCHC: 31.4 g/dL (ref 30.0–36.0)
MCV: 72.5 fL — ABNORMAL LOW (ref 78.0–100.0)
Platelets: 203 10*3/uL (ref 150–400)
RBC: 3.96 MIL/uL — ABNORMAL LOW (ref 4.22–5.81)
RDW: 19.3 % — ABNORMAL HIGH (ref 11.5–15.5)
WBC: 9 10*3/uL (ref 4.0–10.5)

## 2016-01-13 LAB — DIFFERENTIAL
Basophils Absolute: 0 10*3/uL (ref 0.0–0.1)
Basophils Relative: 0 %
Eosinophils Absolute: 0.1 10*3/uL (ref 0.0–0.7)
Eosinophils Relative: 1 %
LYMPHS PCT: 15 %
Lymphs Abs: 1.4 10*3/uL (ref 0.7–4.0)
Monocytes Absolute: 0.3 10*3/uL (ref 0.1–1.0)
Monocytes Relative: 4 %
NEUTROS ABS: 7.1 10*3/uL (ref 1.7–7.7)
NEUTROS PCT: 80 %

## 2016-01-13 LAB — BASIC METABOLIC PANEL
ANION GAP: 14 (ref 5–15)
BUN: 76 mg/dL — AB (ref 6–20)
CALCIUM: 8.8 mg/dL — AB (ref 8.9–10.3)
CO2: 18 mmol/L — AB (ref 22–32)
Chloride: 104 mmol/L (ref 101–111)
Creatinine, Ser: 3.87 mg/dL — ABNORMAL HIGH (ref 0.61–1.24)
GFR calc Af Amer: 17 mL/min — ABNORMAL LOW (ref >60)
GFR, EST NON AFRICAN AMERICAN: 15 mL/min — AB (ref >60)
GLUCOSE: 91 mg/dL (ref 65–99)
Potassium: 3.9 mmol/L (ref 3.5–5.1)
Sodium: 136 mmol/L (ref 135–145)

## 2016-01-13 LAB — I-STAT CG4 LACTIC ACID, ED: Lactic Acid, Venous: 1.63 mmol/L (ref 0.5–1.9)

## 2016-01-13 LAB — I-STAT TROPONIN, ED: Troponin i, poc: 0 ng/mL (ref 0.00–0.08)

## 2016-01-13 LAB — CBG MONITORING, ED: GLUCOSE-CAPILLARY: 90 mg/dL (ref 65–99)

## 2016-01-13 MED ORDER — PIPERACILLIN-TAZOBACTAM 3.375 G IVPB 30 MIN
3.3750 g | Freq: Once | INTRAVENOUS | Status: AC
  Administered 2016-01-13: 3.375 g via INTRAVENOUS
  Filled 2016-01-13: qty 50

## 2016-01-13 MED ORDER — VANCOMYCIN HCL IN DEXTROSE 1-5 GM/200ML-% IV SOLN
1000.0000 mg | Freq: Once | INTRAVENOUS | Status: AC
  Administered 2016-01-13: 1000 mg via INTRAVENOUS
  Filled 2016-01-13: qty 200

## 2016-01-13 MED ORDER — SODIUM CHLORIDE 0.9 % IV BOLUS (SEPSIS)
1000.0000 mL | Freq: Once | INTRAVENOUS | Status: AC
  Administered 2016-01-13: 1000 mL via INTRAVENOUS

## 2016-01-13 NOTE — ED Notes (Signed)
Bair Hugger applied to patient.

## 2016-01-13 NOTE — ED Triage Notes (Signed)
Pt reports dizziness and weakness for two days. Denies any falls or LOC

## 2016-01-13 NOTE — ED Provider Notes (Signed)
By signing my name below, I, Dora Sims, attest that this documentation has been prepared under the direction and in the presence of physician practitioner, Delice Bison Ward, DO. Electronically Signed: Dora Sims, Scribe. 01/13/2016. 11:07 PM.  TIME SEEN: 11:07 PM  CHIEF COMPLAINT: Dizziness; Weakness  HPI: HPI Comments: Leonard [name] is a 65 y.o. male brought in by EMS, with PMHx significant for HTN, HLD, CHF, CAD, and DM type II, who presents to the Emergency Department complaining of sudden onset, constant, dizziness for the last couple of days. He reports associated lightheadedness and generalized weakness. He states his dizziness is worse with certain positions. No h/o similar symptoms in the past. He reports his daily blood pressure medication (Coreg) dosage was increased several days ago. He notes he has not been eating as much as usual since onset; he last ate shortly PTA. No falls or LOC. He denies CP, SOB, nausea, vomiting, diarrhea, hematochezia, melena, fever, chills, pain, numbness, or any other associated symptoms.   ROS: See HPI Constitutional: no fever  Eyes: no drainage  ENT: no runny nose   Cardiovascular:  no chest pain  Resp: no SOB  GI: no vomiting GU: no dysuria Integumentary: no rash  Allergy: no hives  Musculoskeletal: no leg swelling  Neurological: no slurred speech ROS otherwise negative  PAST MEDICAL HISTORY/PAST SURGICAL HISTORY:  Past Medical History:  Diagnosis Date  . Cancer ([place])    skin cancer-  . CHF (congestive heart failure) ([place])   . Coronary artery disease   . Gangrene of toe ([place])    right 5th/notes 01/04/2013   . High cholesterol   . Hypertension   . Iron deficiency anemia 08/03/2015  . 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 PAD (peripheral artery disease) ([place])   . Shortness of breath dyspnea    with exertion  . Type II diabetes mellitus (HCC)    Type II    MEDICATIONS:  Prior to Admission  medications   Medication Sig Start Date End Date Taking? Authorizing Provider  ADVAIR DISKUS 250-50 MCG/DOSE AEPB INHALE ONE PUFF INTO THE LUNGS TWO TIMESDAILY 11/22/15   [name], MD  aspirin 81 MG tablet TAKE ONE (1) TABLET BY MOUTH EVERY DAY 10/18/15   [name], MD  atorvastatin (LIPITOR) 40 MG tablet TAKE 1 TABLET BY MOUTH EVERY DAY AT 6 PM 11/07/15   Shirley Friar, PA-C  carvedilol (COREG) 25 MG tablet Take 2 tablets (50 mg total) by mouth 2 (two) times daily with a meal. 10/23/15   Shirley Friar, PA-C  cloNIDine (CATAPRES) 0.3 MG tablet Take 1 tablet (0.3 mg total) by mouth 2 (two) times daily. 09/28/15   Larey Dresser, MD  doxazosin (CARDURA) 8 MG tablet Take 1 tablet (8 mg total) by mouth 2 (two) times daily. 01/07/16   Larey Dresser, MD  hydrALAZINE (APRESOLINE) 100 MG tablet TAKE ONE TABLET BY MOUTH EVERY EIGHT HOURS 10/23/15   Shirley Friar, PA-C  insulin aspart (NOVOLOG FLEXPEN) 100 UNIT/ML FlexPen Inject 20 Units into the skin 3 (three) times daily with meals. 01/09/16   [name], MD  Insulin Glargine (LANTUS SOLOSTAR) 100 UNIT/ML Solostar Pen Inject 60 Units into the skin daily. 09/14/15   Zenia Resides, MD  isosorbide mononitrate (IMDUR) 60 MG 24 hr tablet TAKE ONE (1) TABLET BY MOUTH EVERY DAY 08/02/15   Rosemarie Ax, MD  levothyroxine (SYNTHROID, LEVOTHROID) 50 MCG tablet TAKE ONE (1) TABLET BY MOUTH  EVERY DAY BEFORE BREAKFAST 09/26/15   [name], MD  NIFEdipine (PROCARDIA XL/ADALAT-CC) 60 MG 24 hr tablet Take 2 tablets (120 mg total) by mouth daily. 01/07/16   Larey Dresser, MD  nitroGLYCERIN (NITROSTAT) 0.4 MG SL tablet Place 1 tablet (0.4 mg total) under the tongue every 5 (five) minutes as needed for chest pain. 06/06/15   Mercy Riding, MD  pantoprazole (PROTONIX) 40 MG tablet TAKE 1 TABLET BY MOUTH DAILY 10/18/15   [name], MD  potassium chloride SA (K-DUR,KLOR-CON) 20 MEQ tablet Take 51meq (1  tablet ) in the AM and 14meq (1/2 tablet) in the PM 11/21/15   Satira Mccallum Tillery, PA-C  PROVENTIL HFA 108 (90 Base) MCG/ACT inhaler INHALE 2 PUFFS INTO THE LUNGS EVERY 6 HOURS AS NEEDED FOR WHEEZING ORSHORTNESS OF BREATH 09/25/15   [name], MD  SURE COMFORT PEN NEEDLES 31G X 8 MM MISC USE WITH BOTH INSULIN PENS TWICE DAILY (4 PER DAY) 06/14/15   Olin Hauser, DO  torsemide (DEMADEX) 20 MG tablet Take 2 tablets (40 mg total) by mouth 2 (two) times daily. 11/26/15   Shirley Friar, PA-C    ALLERGIES:  No Known Allergies  SOCIAL HISTORY:  Social History  Substance Use Topics  . Smoking status: Former Smoker    Packs/day: 1.00    Years: 46.00    Types: Cigarettes    Start date: 02/04/1967    Quit date: 02/03/2013  . Smokeless tobacco: Never Used     Comment: Doing well with quitting.  . Alcohol use No    FAMILY HISTORY: Family History  Problem Relation Age of Onset  . Cancer Mother     lung cancer  . Hypertension Mother   . Cancer Sister     patient thinks it was uterine cancer  . Hypertension Sister   . Heart attack Sister   . Stroke Neg Hx     EXAM: BP 132/62 (BP Location: Right Arm)   Pulse (!) 55   Temp (!) 93.4 F (34.1 C) (Rectal) Comment: Ward, DO aware  Resp 13   Ht 5\' 8"  (1.727 m)   Wt 220 lb (99.8 kg)   SpO2 96%   BMI 33.45 kg/m  CONSTITUTIONAL: Alert and oriented and responds appropriately to questions. Elderly, chronically ill-appearing; well-nourished, Hypothermic HEAD: Normocephalic EYES: Conjunctivae clear, PERRL, EOMI ENT: normal nose; no rhinorrhea; dry mucous membranes NECK: Supple, no meningismus, no nuchal rigidity, no LAD  CARD: RRR; S1 and S2 appreciated; no murmurs, no clicks, no rubs, no gallops RESP: Normal chest excursion without splinting or tachypnea; breath sounds clear and equal bilaterally; no wheezes, no rhonchi, no rales, no hypoxia or respiratory distress, speaking full sentences ABD/GI: Normal bowel  sounds; non-distended; soft, non-tender, no rebound, no guarding, no peritoneal signs, no hepatosplenomegaly BACK:  The back appears normal and is non-tender to palpation, there is no CVA tenderness EXT: Normal ROM in all joints; non-tender to palpation; no edema; normal capillary refill; no cyanosis, no calf tenderness or swelling    SKIN: Normal color for age and race; warm; no rash NEURO: Moves all extremities equally, sensation to light touch intact diffusely, cranial nerves II through XII intact, normal speech PSYCH: The patient's mood and manner are appropriate. Grooming and personal hygiene are appropriate.  MEDICAL DECISION MAKING: Patient here with complaints of feeling lightheaded and weak for the past 2 days. Recently seen at his primary care physician's office and found to have acute on chronic  renal failure. It appears they attempted to contact him several times and were unable to reach him. He denies any recent infectious symptoms, chest pain or shortness of breath, focal neurologic deficits. Here he is hypothermic and denies recent exposure outside. We'll start septic workup. Will give IV fluids, broad-spectrum antibiotics. Has a history of grade 2 diastolic dysfunction but does not appear volume overloaded. I feel he will need admission.  Bair hugger placed on patient.  ED PROGRESS: 1:55 AM  Patient's labs show acute on chronic renal failure. Creatinine today is 3.87. He is uremic causing metabolic acidosis. Chronically anemic. No leukocytosis. Lactate normal. Troponin negative. Urine shows large leukocytes, too numerous to count white blood cells and many bacteria. We'll send urine culture. Chest x-ray shows shallow inspiration with atelectatic changes and mild congestion. Infiltrate not excluded but he denies any cough, chest pain or shortness of breath. He has received broad-spectrum antibiotics. We'll discuss with family medicine for admission.   2:00 AM  D/w FM resident for  admission. We will recheck his rectal temperature. Will admit to step down. I will place holding orders per their request. They're attending is Dr. Ree Kida.  Will admit to stepdown, inpatient. Patient has been updated with this plan.   Of note, patient is hypoxic when he is sleeping. Suspect he has some sleep apnea. Sats improved to 96% on 4 L nasal cannula. We'll monitor this closely. He denies feeling short of breath.   EKG Interpretation  Date/Time:  Sunday January 13 2016 23:01:33 EST Ventricular Rate:  54 PR Interval:    QRS Duration: 104 QT Interval:  529 QTC Calculation: 502 R Axis:   26 Text Interpretation:  Age not entered, assumed to be  65 years old for purpose of ECG interpretation Normal sinus rhythm Low voltage, precordial leads Anteroseptal infarct, old Prolonged QT interval No significant change since last tracing Confirmed by WARD,  DO, KRISTEN [phone]) on 01/13/2016 11:27:06 PM        CRITICAL CARE Performed by: Nyra Jabs   Total critical care time: 45 minutes  Critical care time was exclusive of separately billable procedures and treating other patients.  Critical care was necessary to treat or prevent imminent or life-threatening deterioration.  Critical care was time spent personally by me on the following activities: development of treatment plan with patient and/or surrogate as well as nursing, discussions with consultants, evaluation of patient's response to treatment, examination of patient, obtaining history from patient or surrogate, ordering and performing treatments and interventions, ordering and review of laboratory studies, ordering and review of radiographic studies, pulse oximetry and re-evaluation of patient's condition.     I reviewed all nursing notes, vitals, pertinent old records, EKGs, labs, imaging (as available).   I personally performed the services described in this documentation, which was scribed in my presence. The recorded  information has been reviewed and is accurate.    [place], DO 01/14/16 0200

## 2016-01-14 ENCOUNTER — Emergency Department (HOSPITAL_COMMUNITY): Payer: Medicaid Other

## 2016-01-14 ENCOUNTER — Inpatient Hospital Stay (HOSPITAL_COMMUNITY): Payer: Medicaid Other

## 2016-01-14 DIAGNOSIS — Z794 Long term (current) use of insulin: Secondary | ICD-10-CM | POA: Diagnosis not present

## 2016-01-14 DIAGNOSIS — N390 Urinary tract infection, site not specified: Secondary | ICD-10-CM | POA: Diagnosis present

## 2016-01-14 DIAGNOSIS — E039 Hypothyroidism, unspecified: Secondary | ICD-10-CM | POA: Diagnosis present

## 2016-01-14 DIAGNOSIS — T68XXXA Hypothermia, initial encounter: Secondary | ICD-10-CM

## 2016-01-14 DIAGNOSIS — Z89421 Acquired absence of other right toe(s): Secondary | ICD-10-CM | POA: Diagnosis not present

## 2016-01-14 DIAGNOSIS — N184 Chronic kidney disease, stage 4 (severe): Secondary | ICD-10-CM | POA: Diagnosis present

## 2016-01-14 DIAGNOSIS — J441 Chronic obstructive pulmonary disease with (acute) exacerbation: Secondary | ICD-10-CM | POA: Diagnosis not present

## 2016-01-14 DIAGNOSIS — Z7982 Long term (current) use of aspirin: Secondary | ICD-10-CM | POA: Diagnosis not present

## 2016-01-14 DIAGNOSIS — E860 Dehydration: Secondary | ICD-10-CM | POA: Diagnosis present

## 2016-01-14 DIAGNOSIS — N179 Acute kidney failure, unspecified: Secondary | ICD-10-CM | POA: Diagnosis present

## 2016-01-14 DIAGNOSIS — Z95820 Peripheral vascular angioplasty status with implants and grafts: Secondary | ICD-10-CM | POA: Diagnosis not present

## 2016-01-14 DIAGNOSIS — E1151 Type 2 diabetes mellitus with diabetic peripheral angiopathy without gangrene: Secondary | ICD-10-CM | POA: Diagnosis present

## 2016-01-14 DIAGNOSIS — I10 Essential (primary) hypertension: Secondary | ICD-10-CM | POA: Diagnosis not present

## 2016-01-14 DIAGNOSIS — J449 Chronic obstructive pulmonary disease, unspecified: Secondary | ICD-10-CM | POA: Diagnosis present

## 2016-01-14 DIAGNOSIS — Z951 Presence of aortocoronary bypass graft: Secondary | ICD-10-CM | POA: Diagnosis not present

## 2016-01-14 DIAGNOSIS — I130 Hypertensive heart and chronic kidney disease with heart failure and stage 1 through stage 4 chronic kidney disease, or unspecified chronic kidney disease: Secondary | ICD-10-CM | POA: Diagnosis present

## 2016-01-14 DIAGNOSIS — Z801 Family history of malignant neoplasm of trachea, bronchus and lung: Secondary | ICD-10-CM | POA: Diagnosis not present

## 2016-01-14 DIAGNOSIS — D472 Monoclonal gammopathy: Secondary | ICD-10-CM | POA: Diagnosis present

## 2016-01-14 DIAGNOSIS — Z85828 Personal history of other malignant neoplasm of skin: Secondary | ICD-10-CM | POA: Diagnosis not present

## 2016-01-14 DIAGNOSIS — E11319 Type 2 diabetes mellitus with unspecified diabetic retinopathy without macular edema: Secondary | ICD-10-CM | POA: Diagnosis present

## 2016-01-14 DIAGNOSIS — I2510 Atherosclerotic heart disease of native coronary artery without angina pectoris: Secondary | ICD-10-CM | POA: Diagnosis present

## 2016-01-14 DIAGNOSIS — Z87891 Personal history of nicotine dependence: Secondary | ICD-10-CM | POA: Diagnosis not present

## 2016-01-14 DIAGNOSIS — B9620 Unspecified Escherichia coli [E. coli] as the cause of diseases classified elsewhere: Secondary | ICD-10-CM | POA: Diagnosis present

## 2016-01-14 DIAGNOSIS — I5032 Chronic diastolic (congestive) heart failure: Secondary | ICD-10-CM | POA: Diagnosis present

## 2016-01-14 DIAGNOSIS — E1122 Type 2 diabetes mellitus with diabetic chronic kidney disease: Secondary | ICD-10-CM | POA: Diagnosis present

## 2016-01-14 DIAGNOSIS — E872 Acidosis: Secondary | ICD-10-CM | POA: Diagnosis present

## 2016-01-14 DIAGNOSIS — E782 Mixed hyperlipidemia: Secondary | ICD-10-CM | POA: Diagnosis present

## 2016-01-14 LAB — URINALYSIS, ROUTINE W REFLEX MICROSCOPIC
BILIRUBIN URINE: NEGATIVE
Glucose, UA: NEGATIVE mg/dL
Hgb urine dipstick: NEGATIVE
Ketones, ur: NEGATIVE mg/dL
NITRITE: NEGATIVE
PH: 5 (ref 5.0–8.0)
Protein, ur: 100 mg/dL — AB
SPECIFIC GRAVITY, URINE: 1.011 (ref 1.005–1.030)

## 2016-01-14 LAB — BRAIN NATRIURETIC PEPTIDE
B Natriuretic Peptide: 254.9 pg/mL — ABNORMAL HIGH (ref 0.0–100.0)
B Natriuretic Peptide: 511.8 pg/mL — ABNORMAL HIGH (ref 0.0–100.0)

## 2016-01-14 LAB — BASIC METABOLIC PANEL
ANION GAP: 13 (ref 5–15)
BUN: 75 mg/dL — ABNORMAL HIGH (ref 6–20)
CHLORIDE: 105 mmol/L (ref 101–111)
CO2: 17 mmol/L — ABNORMAL LOW (ref 22–32)
Calcium: 8 mg/dL — ABNORMAL LOW (ref 8.9–10.3)
Creatinine, Ser: 3.75 mg/dL — ABNORMAL HIGH (ref 0.61–1.24)
GFR, EST AFRICAN AMERICAN: 18 mL/min — AB (ref >60)
GFR, EST NON AFRICAN AMERICAN: 16 mL/min — AB (ref >60)
Glucose, Bld: 190 mg/dL — ABNORMAL HIGH (ref 65–99)
Potassium: 4.3 mmol/L (ref 3.5–5.1)
SODIUM: 135 mmol/L (ref 135–145)

## 2016-01-14 LAB — CBC
HEMATOCRIT: 26.6 % — AB (ref 39.0–52.0)
Hemoglobin: 8.2 g/dL — ABNORMAL LOW (ref 13.0–17.0)
MCH: 22.5 pg — ABNORMAL LOW (ref 26.0–34.0)
MCHC: 30.8 g/dL (ref 30.0–36.0)
MCV: 73.1 fL — AB (ref 78.0–100.0)
Platelets: 171 10*3/uL (ref 150–400)
RBC: 3.64 MIL/uL — AB (ref 4.22–5.81)
RDW: 19.4 % — AB (ref 11.5–15.5)
WBC: 6.5 10*3/uL (ref 4.0–10.5)

## 2016-01-14 LAB — GLUCOSE, CAPILLARY
GLUCOSE-CAPILLARY: 283 mg/dL — AB (ref 65–99)
Glucose-Capillary: 197 mg/dL — ABNORMAL HIGH (ref 65–99)
Glucose-Capillary: 293 mg/dL — ABNORMAL HIGH (ref 65–99)

## 2016-01-14 LAB — I-STAT CG4 LACTIC ACID, ED: Lactic Acid, Venous: 0.85 mmol/L (ref 0.5–1.9)

## 2016-01-14 LAB — MRSA PCR SCREENING: MRSA BY PCR: NEGATIVE

## 2016-01-14 LAB — TSH: TSH: 3.303 u[IU]/mL (ref 0.350–4.500)

## 2016-01-14 MED ORDER — PIPERACILLIN-TAZOBACTAM 3.375 G IVPB
3.3750 g | Freq: Three times a day (TID) | INTRAVENOUS | Status: DC
  Administered 2016-01-14 – 2016-01-18 (×13): 3.375 g via INTRAVENOUS
  Filled 2016-01-14 (×17): qty 50

## 2016-01-14 MED ORDER — CLONIDINE HCL 0.1 MG PO TABS
0.3000 mg | ORAL_TABLET | Freq: Two times a day (BID) | ORAL | Status: DC
  Administered 2016-01-14 – 2016-01-16 (×5): 0.3 mg via ORAL
  Filled 2016-01-14 (×5): qty 3

## 2016-01-14 MED ORDER — CARVEDILOL 25 MG PO TABS
50.0000 mg | ORAL_TABLET | Freq: Two times a day (BID) | ORAL | Status: DC
  Administered 2016-01-14 – 2016-01-18 (×9): 50 mg via ORAL
  Filled 2016-01-14: qty 4
  Filled 2016-01-14 (×2): qty 2
  Filled 2016-01-14: qty 4
  Filled 2016-01-14 (×5): qty 2

## 2016-01-14 MED ORDER — PANTOPRAZOLE SODIUM 40 MG PO TBEC
40.0000 mg | DELAYED_RELEASE_TABLET | Freq: Every day | ORAL | Status: DC
  Administered 2016-01-14 – 2016-01-18 (×5): 40 mg via ORAL
  Filled 2016-01-14 (×5): qty 1

## 2016-01-14 MED ORDER — SODIUM CHLORIDE 0.45 % IV SOLN
INTRAVENOUS | Status: DC
  Administered 2016-01-14: 05:00:00 via INTRAVENOUS

## 2016-01-14 MED ORDER — INSULIN ASPART 100 UNIT/ML ~~LOC~~ SOLN
0.0000 [IU] | Freq: Three times a day (TID) | SUBCUTANEOUS | Status: DC
  Administered 2016-01-14: 2 [IU] via SUBCUTANEOUS
  Administered 2016-01-14: 5 [IU] via SUBCUTANEOUS
  Administered 2016-01-15: 2 [IU] via SUBCUTANEOUS
  Administered 2016-01-15 – 2016-01-16 (×3): 5 [IU] via SUBCUTANEOUS
  Administered 2016-01-16: 3 [IU] via SUBCUTANEOUS
  Administered 2016-01-16 – 2016-01-17 (×2): 2 [IU] via SUBCUTANEOUS
  Administered 2016-01-17: 3 [IU] via SUBCUTANEOUS
  Administered 2016-01-17: 2 [IU] via SUBCUTANEOUS
  Administered 2016-01-18: 1 [IU] via SUBCUTANEOUS

## 2016-01-14 MED ORDER — INSULIN GLARGINE 100 UNIT/ML ~~LOC~~ SOLN
30.0000 [IU] | Freq: Every day | SUBCUTANEOUS | Status: DC
  Administered 2016-01-14: 30 [IU] via SUBCUTANEOUS
  Filled 2016-01-14 (×3): qty 0.3

## 2016-01-14 MED ORDER — POTASSIUM CHLORIDE CRYS ER 20 MEQ PO TBCR
20.0000 meq | EXTENDED_RELEASE_TABLET | Freq: Two times a day (BID) | ORAL | Status: DC
  Administered 2016-01-14 – 2016-01-18 (×9): 20 meq via ORAL
  Filled 2016-01-14 (×10): qty 1

## 2016-01-14 MED ORDER — HYDRALAZINE HCL 50 MG PO TABS
100.0000 mg | ORAL_TABLET | Freq: Three times a day (TID) | ORAL | Status: DC
  Administered 2016-01-14 – 2016-01-18 (×11): 100 mg via ORAL
  Filled 2016-01-14 (×13): qty 2

## 2016-01-14 MED ORDER — INSULIN ASPART 100 UNIT/ML ~~LOC~~ SOLN
0.0000 [IU] | Freq: Every day | SUBCUTANEOUS | Status: DC
  Administered 2016-01-14 – 2016-01-15 (×2): 3 [IU] via SUBCUTANEOUS
  Administered 2016-01-16 – 2016-01-17 (×2): 2 [IU] via SUBCUTANEOUS

## 2016-01-14 MED ORDER — LEVOTHYROXINE SODIUM 50 MCG PO TABS
50.0000 ug | ORAL_TABLET | Freq: Every day | ORAL | Status: DC
  Administered 2016-01-14 – 2016-01-18 (×5): 50 ug via ORAL
  Filled 2016-01-14 (×5): qty 1

## 2016-01-14 MED ORDER — FUROSEMIDE 10 MG/ML IJ SOLN
60.0000 mg | Freq: Once | INTRAMUSCULAR | Status: AC
  Administered 2016-01-14: 60 mg via INTRAVENOUS
  Filled 2016-01-14: qty 6

## 2016-01-14 MED ORDER — TORSEMIDE 20 MG PO TABS
40.0000 mg | ORAL_TABLET | Freq: Two times a day (BID) | ORAL | Status: DC
  Administered 2016-01-14 – 2016-01-18 (×9): 40 mg via ORAL
  Filled 2016-01-14 (×9): qty 2

## 2016-01-14 MED ORDER — ISOSORBIDE MONONITRATE ER 60 MG PO TB24
60.0000 mg | ORAL_TABLET | Freq: Every day | ORAL | Status: DC
  Administered 2016-01-14 – 2016-01-18 (×5): 60 mg via ORAL
  Filled 2016-01-14 (×2): qty 2
  Filled 2016-01-14 (×3): qty 1

## 2016-01-14 MED ORDER — DOXAZOSIN MESYLATE 8 MG PO TABS
8.0000 mg | ORAL_TABLET | Freq: Two times a day (BID) | ORAL | Status: DC
Start: 2016-01-14 — End: 2016-01-18
  Administered 2016-01-14 – 2016-01-18 (×7): 8 mg via ORAL
  Filled 2016-01-14 (×11): qty 1

## 2016-01-14 MED ORDER — MOMETASONE FURO-FORMOTEROL FUM 200-5 MCG/ACT IN AERO
2.0000 | INHALATION_SPRAY | Freq: Two times a day (BID) | RESPIRATORY_TRACT | Status: DC
Start: 2016-01-14 — End: 2016-01-18
  Administered 2016-01-14 – 2016-01-18 (×7): 2 via RESPIRATORY_TRACT
  Filled 2016-01-14 (×2): qty 8.8

## 2016-01-14 MED ORDER — ASPIRIN EC 81 MG PO TBEC
81.0000 mg | DELAYED_RELEASE_TABLET | Freq: Every day | ORAL | Status: DC
  Administered 2016-01-14 – 2016-01-18 (×5): 81 mg via ORAL
  Filled 2016-01-14 (×5): qty 1

## 2016-01-14 MED ORDER — SODIUM CHLORIDE 0.9% FLUSH
3.0000 mL | Freq: Two times a day (BID) | INTRAVENOUS | Status: DC
  Administered 2016-01-14 – 2016-01-18 (×9): 3 mL via INTRAVENOUS

## 2016-01-14 MED ORDER — HEPARIN SODIUM (PORCINE) 5000 UNIT/ML IJ SOLN
5000.0000 [IU] | Freq: Three times a day (TID) | INTRAMUSCULAR | Status: DC
  Administered 2016-01-14 – 2016-01-18 (×12): 5000 [IU] via SUBCUTANEOUS
  Filled 2016-01-14 (×12): qty 1

## 2016-01-14 MED ORDER — VANCOMYCIN HCL 10 G IV SOLR
1250.0000 mg | INTRAVENOUS | Status: DC
  Administered 2016-01-15: 1250 mg via INTRAVENOUS
  Filled 2016-01-14: qty 1250

## 2016-01-14 MED ORDER — NIFEDIPINE ER 60 MG PO TB24
120.0000 mg | ORAL_TABLET | Freq: Every day | ORAL | Status: DC
  Administered 2016-01-14 – 2016-01-18 (×5): 120 mg via ORAL
  Filled 2016-01-14 (×5): qty 2

## 2016-01-14 MED ORDER — ATORVASTATIN CALCIUM 40 MG PO TABS
40.0000 mg | ORAL_TABLET | Freq: Every day | ORAL | Status: DC
  Administered 2016-01-14 – 2016-01-17 (×4): 40 mg via ORAL
  Filled 2016-01-14 (×4): qty 1

## 2016-01-14 MED ORDER — ALBUTEROL SULFATE (2.5 MG/3ML) 0.083% IN NEBU: 2.5000 mg | INHALATION_SOLUTION | RESPIRATORY_TRACT | Status: DC | PRN

## 2016-01-14 NOTE — ED Notes (Signed)
Attempted report x1. 

## 2016-01-14 NOTE — ED Notes (Signed)
Pt c/o "slight dizziness" upon standing for orthostatic vital signs

## 2016-01-14 NOTE — Progress Notes (Signed)
  FPTS Interim Progress Note  S: [name] is doing well this morning. He denies fevers, chills, chest pain, cough, dysuria.   O: BP (!) 137/58   Pulse 69   Temp 98.8 F (37.1 C) (Oral)   Resp 17   Ht 5\' 8"  (1.727 m)   Wt 220 lb (99.8 kg)   SpO2 92%   BMI 33.45 kg/m   Gen: elderly male laying in bed in NAD Cardiac: RRR no MRG Respiratory: crackles auscultated in right anterior lung field. No increased work of breathing. Abdomen: soft, non-tender, non-distended. +BS Extremities: +1 pitting edema bilaterally up to ankles  A/P: Hypothermia- resolved- hypothermic to 93.2 on arrival to ED. Possibly related to UTI. Does not currently meet SIRS criteria. Urinalysis with many bacteria, hyaline casts, large leukocytes. CXR could not rule out underlying infiltrate. WBC 9.0. Lactic acid 1.63. Afebrile. BP soft on admission 106/60. Q-Sofa score 0. Differential for hypothermia includes uremia as patient with CKD and elevated Cr, however no cognitive or neuro deficits. Differential also includes hypothyroidism for which patient is already on Synthroid. Less likely to be CVA, no neural deficits. WBC 6.5 Lactic acid 0.85 this morning. -continue vanc/zosyn empirically for now -blood cultures pending -urine cultures pending -discontinue fluids  -oxygen as needed, goal to keep O2 >90% -will obtain 2 view CXR -obtain orthostatic vital signs -PT/OT consult   Acute on CKD stage 4- creatinine on admission 3.87. Potassium 3.9. Anion gap 14. Baseline appears to be 3.0 over past several months. Possibly 2/2 dehydration vs infection. Creatinine 3.75 this morning. -avoid nephrotoxic agents -consider consult to nephrology if no improvement in Cr or if worsening mentation  Hypothyroidism- takes synthroid 10mcg daily. Last TSH elevated at 12.559 in May 2017. Could be contributing to hypothermia on admission. -recheck TSH is normal at 3.303 -continue synthroid  Steve Rattler, DO 01/14/2016, 8:12  AM PGY-1, [facility] pager [phone]

## 2016-01-14 NOTE — Assessment & Plan Note (Signed)
Creatinine seems to be creeping up over last couple months. Baseline was around 2.5-3. Creatinine 2 days ago was 3.64. At this time I'm hesitant to decrease torsemide dose as he has significant CHF exacerbation history requiring hospitalization. Discussed patient with Dr. Nori Riis about patient's kidney function. Discussed pros and cons with patient and the sister of keeping torsemide dose the same. -Continue torsemide 80 mg daily -Continue to monitor kidney function, follow-up in one week for repeat BMP -Follow-up with nephrology

## 2016-01-14 NOTE — Assessment & Plan Note (Signed)
Chest pain shortness of breath have both improved since being seen last. He has been taking his COPD medications as prescribed. Vital signs within normal limits. Still no signs of CHF exacerbation. BNP from 2 days ago only slightly elevated, not within exacerbation range. Does not appear fluid overloaded on exam. -Continue heart failure medications as prescribed -Follow-up in one week

## 2016-01-14 NOTE — Progress Notes (Signed)
Pharmacy Antibiotic Note  Leonard [name] is a 65 y.o. male admitted on 01/13/2016 with sepsis.  Pharmacy has been consulted for Vancomycin/Zosyn dosing. WBC WNL. Normalized creatinine clearance is ~20.   Plan: -Vancomycin 1250 mg IV q48h, will need dose adjustment if renal function improves -Zosyn 3.375G IV q8h to be infused over 4 hours -Trend WBC, temp, renal function  -Drug levels as indicated   Height: 5\' 8"  (172.7 cm) Weight: 220 lb (99.8 kg) IBW/kg (Calculated) : 68.4  Temp (24hrs), Avg:93.4 F (34.1 C), Min:93.4 F (34.1 C), Max:93.4 F (34.1 C)   Recent Labs Lab 01/09/16 1606 01/13/16 2306 01/13/16 2338  WBC  --  9.0  --   CREATININE 3.64* 3.87*  --   LATICACIDVEN  --   --  1.63    Estimated Creatinine Clearance: 22.1 mL/min (by C-G formula based on SCr of 3.87 mg/dL (H)).    No Known Allergies   Narda Bonds 01/14/2016 12:39 AM

## 2016-01-14 NOTE — ED Notes (Signed)
Pulse ox decreased to 82% on room air while patient resting with eyes closed. Oxygen applied at 2 LPM via nasal cannula.
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
## 2016-01-15 DIAGNOSIS — N189 Chronic kidney disease, unspecified: Secondary | ICD-10-CM

## 2016-01-15 DIAGNOSIS — J441 Chronic obstructive pulmonary disease with (acute) exacerbation: Secondary | ICD-10-CM

## 2016-01-15 DIAGNOSIS — N179 Acute kidney failure, unspecified: Secondary | ICD-10-CM

## 2016-01-15 LAB — GLUCOSE, CAPILLARY
GLUCOSE-CAPILLARY: 256 mg/dL — AB (ref 65–99)
GLUCOSE-CAPILLARY: 295 mg/dL — AB (ref 65–99)
Glucose-Capillary: 194 mg/dL — ABNORMAL HIGH (ref 65–99)
Glucose-Capillary: 276 mg/dL — ABNORMAL HIGH (ref 65–99)

## 2016-01-15 LAB — CBC
HEMATOCRIT: 27.6 % — AB (ref 39.0–52.0)
HEMOGLOBIN: 8.4 g/dL — AB (ref 13.0–17.0)
MCH: 22.3 pg — ABNORMAL LOW (ref 26.0–34.0)
MCHC: 30.4 g/dL (ref 30.0–36.0)
MCV: 73.2 fL — AB (ref 78.0–100.0)
Platelets: 181 10*3/uL (ref 150–400)
RBC: 3.77 MIL/uL — AB (ref 4.22–5.81)
RDW: 19.5 % — ABNORMAL HIGH (ref 11.5–15.5)
WBC: 6.8 10*3/uL (ref 4.0–10.5)

## 2016-01-15 LAB — BLOOD GAS, ARTERIAL
Acid-base deficit: 2.9 mmol/L — ABNORMAL HIGH (ref 0.0–2.0)
BICARBONATE: 20.1 mmol/L (ref 20.0–28.0)
DRAWN BY: 398991
FIO2: 0.21
O2 SAT: 81.8 %
PATIENT TEMPERATURE: 98.6
pCO2 arterial: 27.7 mmHg — ABNORMAL LOW (ref 32.0–48.0)
pH, Arterial: 7.474 — ABNORMAL HIGH (ref 7.350–7.450)
pO2, Arterial: 47.7 mmHg — ABNORMAL LOW (ref 83.0–108.0)

## 2016-01-15 LAB — BASIC METABOLIC PANEL
ANION GAP: 10 (ref 5–15)
BUN: 68 mg/dL — ABNORMAL HIGH (ref 6–20)
CO2: 21 mmol/L — AB (ref 22–32)
Calcium: 8.3 mg/dL — ABNORMAL LOW (ref 8.9–10.3)
Chloride: 106 mmol/L (ref 101–111)
Creatinine, Ser: 3.74 mg/dL — ABNORMAL HIGH (ref 0.61–1.24)
GFR calc Af Amer: 18 mL/min — ABNORMAL LOW (ref >60)
GFR calc non Af Amer: 16 mL/min — ABNORMAL LOW (ref >60)
GLUCOSE: 241 mg/dL — AB (ref 65–99)
POTASSIUM: 4.2 mmol/L (ref 3.5–5.1)
Sodium: 137 mmol/L (ref 135–145)

## 2016-01-15 MED ORDER — IPRATROPIUM-ALBUTEROL 0.5-2.5 (3) MG/3ML IN SOLN
3.0000 mL | Freq: Two times a day (BID) | RESPIRATORY_TRACT | Status: DC
  Administered 2016-01-16 – 2016-01-17 (×4): 3 mL via RESPIRATORY_TRACT
  Filled 2016-01-15 (×4): qty 3

## 2016-01-15 MED ORDER — FUROSEMIDE 10 MG/ML IJ SOLN
60.0000 mg | Freq: Once | INTRAMUSCULAR | Status: AC
  Administered 2016-01-15: 60 mg via INTRAVENOUS
  Filled 2016-01-15: qty 6

## 2016-01-15 MED ORDER — IPRATROPIUM-ALBUTEROL 0.5-2.5 (3) MG/3ML IN SOLN
3.0000 mL | Freq: Four times a day (QID) | RESPIRATORY_TRACT | Status: DC
  Administered 2016-01-15 (×2): 3 mL via RESPIRATORY_TRACT
  Filled 2016-01-15 (×2): qty 3

## 2016-01-15 MED ORDER — INSULIN GLARGINE 100 UNIT/ML ~~LOC~~ SOLN
40.0000 [IU] | Freq: Every day | SUBCUTANEOUS | Status: DC
  Administered 2016-01-16 – 2016-01-17 (×3): 40 [IU] via SUBCUTANEOUS
  Filled 2016-01-15 (×5): qty 0.4

## 2016-01-15 MED ORDER — IPRATROPIUM-ALBUTEROL 0.5-2.5 (3) MG/3ML IN SOLN: 3.0000 mL | Freq: Four times a day (QID) | RESPIRATORY_TRACT | Status: DC

## 2016-01-15 NOTE — Progress Notes (Signed)
Report called to Remo Lipps, RN on unit 6 [place] med surg bed, non-tele. Pt will transfer to room 6N27.

## 2016-01-15 NOTE — Progress Notes (Signed)
Family Medicine Teaching Service Daily Progress Note Intern Pager: [phone]  [name] Medical record number: [id number] Date of birth: 13-Feb-1950 Age: 65 y.o. Gender: male  Primary Care Provider: [name], MD Consultants: none Code Status: full  Pt Overview and Major Events to Date:  12/11- admitted to [facility] with hypothermia  Assessment and Plan: Leonard [name] is a 65 y.o. male presenting with weakness and dizziness . PMH is significant for stage 4 CKD, CHF, HTN, CAD, PVD, hx CABG, COPD, HLD, anemia,  hypothyroidism, T2DM  Hypothermia- resolved- hypothermic to 93.2 on arrival to ED. Unclear etiolgoy. Possibly related to UTI or uremia on admission. -continue vanc/zosyn empirically for now -blood cultures pending -urine cultures reincubated for better growth -2 view CXR with stable cardiomegaly -orthostatic vital signs positive from sitting to standing at 0 minutes.  -PT/OT consult   Acute on CKD stage 4- creatinine on admission 3.87. Potassium 3.9. Anion gap 14. Baseline appears to be 3.0 over past several months. Possibly 2/2 dehydration vs infection. Creatinine 3.75 > 3.74 this morning -continue to monitor -avoid nephrotoxic agents -consider consult to nephrology if no improvement in Cr or if worsening mentation  COPD- on albuterol inhaler, advair at home. Appears sleepy over past several days. -dulera -albuterol as needed -add duonebs q6 hours -will get ABG today  HFpEF- last echo May 2017, EF 22-02%, grade 2 diastolic dysfunction. BNP on admission 254. Recheck of BNP 511.8 -only out 383 mL documented, but with unmeasured UOP overnight after 1 dose of 60 mg IV lasix  -continue IV diuretics as creatinine tolerates -Continue home meds  HTN- takes coreg 50mg  BID, clonidine 0.3 BID, hydralazine 100mg  TID, nifedipine 120mg  daily, torsemide 20mg  daily at home. BP 131/65 overnight. -continue home medications -monitor BP closely  T2DM- A1c  9.7 on 01/09/16. Takes lantus 60 units every night and 20 units novolog TID. Sugars have been in 200's.  -Lantus 30 units -moderate SSI -CBG AC/HS  Anemia- hemoglobin on admission 9.0. Baseline appears to be 9. -continue to monitor -hemoglobin 8.4 this morning, no signs of bleeding  Hypothyroidism-takes synthroid 23mcg daily. Last TSH elevated at 12.559 in May 2017. Could be contributing to hypothermia on admission. -recheck TSH is normal at 3.303 -continue synthroid  FEN/GI: heart healthy carb modified Prophylaxis: heparin  Disposition: home pending clinical improvement  Subjective:  Leonard [name] is feeling well today. He feels like his usual self. Denies fevers, chills, shortness of breath, dysuria, cough.   Objective: Temp:  [98 F (36.7 C)-98.8 F (37.1 C)] 98 F (36.7 C) (12/12 0329) Pulse Rate:  [64-77] 67 (12/12 0329) Resp:  [14-22] 20 (12/12 0329) BP: (107-156)/(44-69) 131/65 (12/12 0511) SpO2:  [92 %-97 %] 94 % (12/12 0329) Physical Exam: General: Elderly gentleman, laying in bedside chair, in NAD Cardiovascular: RRR no MRG Respiratory: expiratory wheezes bilaterally, no increased work of breathing Abdomen: soft, non-tender, +BS Extremities: no edema or cyanosis  Laboratory:  Recent Labs Lab 01/13/16 2306 01/14/16 0517 01/15/16 0213  WBC 9.0 6.5 6.8  HGB 9.0* 8.2* 8.4*  HCT 28.7* 26.6* 27.6*  PLT 203 171 181    Recent Labs Lab 01/13/16 2306 01/13/16 2316 01/14/16 0517 01/15/16 0213  NA 136  --  135 137  K 3.9  --  4.3 4.2  CL 104  --  105 106  CO2 18*  --  17* 21*  BUN 76*  --  75* 68*  CREATININE 3.87*  --  3.75* 3.74*  CALCIUM 8.8*  --  8.0* 8.3*  PROT  --  7.1  --   --   BILITOT  --  0.4  --   --   ALKPHOS  --  102  --   --   ALT  --  20  --   --   AST  --  33  --   --   GLUCOSE 91  --  190* 241*    BNP 254 > 511 Troponin 0 A1C 9.7 TSH 3.303  Imaging/Diagnostic Tests: Dg Chest 2 View  Result Date: 01/14/2016 CLINICAL DATA:   Shortness of breath. EXAM: CHEST  2 VIEW COMPARISON:  Radiograph of January 14, 2016. FINDINGS: Stable cardiomegaly. No pneumothorax or pleural effusion is noted. Status post coronary artery bypass graft. Fluid is noted in the right minor fissure. No other pulmonary abnormality is noted. Bony thorax is unremarkable. IMPRESSION: Stable cardiomegaly. Small amount of fluid noted in right minor fissure. Electronically Signed   By: Marijo Conception, M.D.   On: 01/14/2016 09:08     Steve Rattler, DO 01/15/2016, 7:15 AM PGY-1, [place] Intern pager: [phone], text pages welcome

## 2016-01-15 NOTE — Progress Notes (Signed)
Occupational Therapy Evaluation Patient Details Name: ZAKAI GONYEA MRN: [id number] DOB: 1951/01/22 Today's Date: 01/15/2016    History of Present Illness 65 y.o. male presenting with weakness and dizziness . PMH is significant for stage 4 CKD, CHF, HTN, CAD, PVD, hx CABG, COPD, HLD, anemia,  hypothyroidism, T2DM   Clinical Impression   Per pt, he lives with his sister and was independent with ADL and mobility PTA. Unsure of baseline cognition, but pt required mod vc for safety at times. Impulsive and poor safety/judgement awareness. Min A x 1 to prevent fall. O2 91 walking on RA.  Pt will be able to DC home with 24/7 S initially. Will follow acutely to facilitate safe DC home.     Follow Up Recommendations  No OT follow up;Supervision/Assistance - 24 hour (initially)    Equipment Recommendations  Tub/shower seat (unsure if pt has one)    Recommendations for Other Services       Precautions / Restrictions Precautions Precautions: Fall      Mobility Bed Mobility               General bed mobility comments: received in chair  Transfers Overall transfer level: Needs assistance Equipment used: Rolling walker (2 wheeled) Transfers: Sit to/from [facility] Sit to Stand: Min guard Stand pivot transfers: Min guard       General transfer comment: LOB with turns    Balance Overall balance assessment: Needs assistance   Sitting balance-Leahy Scale: Good       Standing balance-Leahy Scale: Fair                              ADL Overall ADL's : Needs assistance/impaired     Grooming: Standing;Min guard   Upper Body Bathing: Set up;Supervision/ safety;Sitting   Lower Body Bathing: Min guard;Sit to/from stand   Upper Body Dressing : Set up;Supervision/safety   Lower Body Dressing: Min guard;Sit to/from stand   Toilet Transfer: Minimal assistance Armed forces technical officer Details (indicate cue type and reason): LOB in bathroom when  turning Toileting- Clothing Manipulation and Hygiene: Supervision/safety Toileting - Clothing Manipulation Details (indicate cue type and reason): Pt urinated on floor/socks and requried cues to change before walking in hall     Functional mobility during ADLs: Min guard General ADL Comments: LOB. Woke pt up from sleeping. Unsure if unsteadyon feet due to level of arousal     Vision     Perception     Praxis      Pertinent Vitals/Pain Pain Assessment: No/denies pain     Hand Dominance Right   Extremity/Trunk Assessment Upper Extremity Assessment Upper Extremity Assessment: Overall WFL for tasks assessed   Lower Extremity Assessment Lower Extremity Assessment: Defer to PT evaluation   Cervical / Trunk Assessment Cervical / Trunk Assessment: Normal   Communication Communication Communication: No difficulties   Cognition Arousal/Alertness: Lethargic awoke pt from sleeping; difficult arousing pt Behavior During Therapy: Impulsive Overall Cognitive Status: No family/caregiver present to determine baseline cognitive functioning  Poor safety/awareness Impulsive                    General Comments       Exercises       Shoulder Instructions      Home Living Family/patient expects to be discharged to:: Private residence Living Arrangements: Other relatives Available Help at Discharge: Family;Available 24 hours/day Type of Home: Apartment ("housing") Home Access: Level entry  Home Layout: One level     Bathroom Shower/Tub: Tub/shower unit Shower/tub characteristics: Curtain Biochemist, clinical: Handicapped height     Home Equipment: Clinical cytogeneticist - 2 wheels;Adaptive equipment;Cane - quad (from previous note) Adaptive Equipment: Reacher;Sock aid;Long-handled shoe horn;Long-handled sponge (per previous note)        Prior Functioning/Environment Level of Independence: Independent with assistive device(s)        Comments: Pt states independent  with ADL and sometimes used RW/cane        OT Problem List: Decreased activity tolerance;Impaired balance (sitting and/or standing);Decreased safety awareness;Decreased knowledge of use of DME or AE;Cardiopulmonary status limiting activity   OT Treatment/Interventions: Self-care/ADL training;Energy conservation;DME and/or AE instruction;Therapeutic activities;Patient/family education;Balance training    OT Goals(Current goals can be found in the care plan section) Acute Rehab OT Goals Patient Stated Goal: gohome OT Goal Formulation: With patient Time For Goal Achievement: 01/29/16 Potential to Achieve Goals: Good  OT Frequency: Min 2X/week   Barriers to D/C:            Co-evaluation              End of Session Equipment Utilized During Treatment: Gait belt;Rolling walker Nurse Communication: Mobility status;Other (comment) (level of arousal/impulsivity)  Activity Tolerance: Patient tolerated treatment well Patient left: in chair;with call bell/phone within reach;with chair alarm set   Time: 1314-1340 OT Time Calculation (min): 26 min Charges:  OT General Charges $OT Visit: 1 Procedure OT Evaluation $OT Eval Moderate Complexity: 1 Procedure G-Codes:    Abb Gobert,HILLARY 07-Feb-2016, 3:57 PM   [facility], OT/L  [phone] 02-07-2016

## 2016-01-15 NOTE — Evaluation (Signed)
Physical Therapy Evaluation Patient Details Name: Leonard [name] MRN: [id number] DOB: [birth year] Today's Date: 01/15/2016   History of Present Illness  65 y.o. male presenting with weakness and dizziness . PMH is significant for stage 4 CKD, CHF, HTN, CAD, PVD, hx CABG, COPD, HLD, anemia,  hypothyroidism, T2DM  Clinical Impression  Patient demonstrates deficits in functional mobility as indicated below. Will need continued skilled PT to address deficits and maximize function. Will see as indicated and progress as tolerated. Highly recommend 24/7 supervision at discharge home due to cognitive impairments, impulsivity, and decreased safety awareness.     Follow Up Recommendations Supervision/Assistance - 24 hour;No PT follow up    Equipment Recommendations  None recommended by PT    Recommendations for Other Services       Precautions / Restrictions Precautions Precautions: Fall      Mobility  Bed Mobility               General bed mobility comments: received in chair  Transfers Overall transfer level: Needs assistance Equipment used: Rolling walker (2 wheeled) Transfers: Sit to/from [facility] Sit to Stand: Min guard Stand pivot transfers: Min guard       General transfer comment: LOB with turns  Ambulation/Gait Ambulation/Gait assistance: Min guard Ambulation Distance (Feet): 210 Feet Assistive device: Rolling walker (2 wheeled) Gait Pattern/deviations: Step-through pattern;Staggering right;Staggering left;Narrow base of support     General Gait Details: patient impulsive during ambulation, cues for safety with use of RW, some noted instability with modest LOB but able to self correct.   Stairs            Wheelchair Mobility    Modified Rankin (Stroke Patients Only)       Balance Overall balance assessment: Needs assistance   Sitting balance-Leahy Scale: Good       Standing balance-Leahy Scale: Fair                High level balance activites: Direction changes;Turns;Sudden stops;Head turns High Level Balance Comments: min guard to min assist for safety and stability with higher level balance tasks             Pertinent Vitals/Pain      Home Living Family/patient expects to be discharged to:: Private residence Living Arrangements: Other relatives Available Help at Discharge: Family;Available 24 hours/day Type of Home: Apartment ("housing") Home Access: Level entry     Home Layout: One level Home Equipment: Clinical cytogeneticist - 2 wheels;Adaptive equipment;Cane - quad (from previous note)      Prior Function Level of Independence: Independent with assistive device(s)         Comments: Pt states independent with ADL and sometimes used RW/cane     Hand Dominance   Dominant Hand: Right    Extremity/Trunk Assessment   Upper Extremity Assessment: Overall WFL for tasks assessed           Lower Extremity Assessment: Overall WFL for tasks assessed      Cervical / Trunk Assessment: Normal  Communication   Communication: No difficulties  Cognition Arousal/Alertness: Lethargic Behavior During Therapy: Impulsive Overall Cognitive Status: No family/caregiver present to determine baseline cognitive functioning                      General Comments      Exercises     Assessment/Plan    PT Assessment Patient needs continued PT services  PT Problem List Decreased strength;Decreased activity tolerance;Decreased balance;Decreased mobility;Decreased cognition;Decreased safety awareness;Cardiopulmonary  status limiting activity          PT Treatment Interventions DME instruction;Gait training;Stair training;Functional mobility training;Therapeutic activities;Therapeutic exercise;Balance training;Patient/family education    PT Goals (Current goals can be found in the Care Plan section)  Acute Rehab PT Goals Patient Stated Goal: gohome PT Goal Formulation: With  patient Time For Goal Achievement: 01/29/16 Potential to Achieve Goals: Fair    Frequency Min 3X/week   Barriers to discharge        Co-evaluation               End of Session Equipment Utilized During Treatment: Gait belt;Oxygen Activity Tolerance: Patient tolerated treatment well;Patient limited by fatigue Patient left: in chair;with call bell/phone within reach;with chair alarm set Nurse Communication: Mobility status         Time: 2162-4469 PT Time Calculation (min) (ACUTE ONLY): 26 min   Charges:   PT Evaluation $PT Eval Moderate Complexity: 1 Procedure     PT G Codes:        Duncan Dull 2016-02-12, 5:26 PM  Alben Deeds, [place] DPT  [phone]

## 2016-01-16 LAB — GLUCOSE, CAPILLARY
GLUCOSE-CAPILLARY: 175 mg/dL — AB (ref 65–99)
GLUCOSE-CAPILLARY: 252 mg/dL — AB (ref 65–99)
Glucose-Capillary: 227 mg/dL — ABNORMAL HIGH (ref 65–99)
Glucose-Capillary: 241 mg/dL — ABNORMAL HIGH (ref 65–99)

## 2016-01-16 LAB — CBC
HEMATOCRIT: 27.8 % — AB (ref 39.0–52.0)
Hemoglobin: 8.4 g/dL — ABNORMAL LOW (ref 13.0–17.0)
MCH: 22 pg — ABNORMAL LOW (ref 26.0–34.0)
MCHC: 30.2 g/dL (ref 30.0–36.0)
MCV: 72.8 fL — AB (ref 78.0–100.0)
PLATELETS: 188 10*3/uL (ref 150–400)
RBC: 3.82 MIL/uL — AB (ref 4.22–5.81)
RDW: 19.2 % — ABNORMAL HIGH (ref 11.5–15.5)
WBC: 6.1 10*3/uL (ref 4.0–10.5)

## 2016-01-16 LAB — BASIC METABOLIC PANEL
ANION GAP: 12 (ref 5–15)
BUN: 61 mg/dL — ABNORMAL HIGH (ref 6–20)
CO2: 20 mmol/L — AB (ref 22–32)
Calcium: 8.6 mg/dL — ABNORMAL LOW (ref 8.9–10.3)
Chloride: 109 mmol/L (ref 101–111)
Creatinine, Ser: 3.73 mg/dL — ABNORMAL HIGH (ref 0.61–1.24)
GFR calc Af Amer: 18 mL/min — ABNORMAL LOW (ref >60)
GFR, EST NON AFRICAN AMERICAN: 16 mL/min — AB (ref >60)
GLUCOSE: 168 mg/dL — AB (ref 65–99)
POTASSIUM: 3.8 mmol/L (ref 3.5–5.1)
Sodium: 141 mmol/L (ref 135–145)

## 2016-01-16 MED ORDER — FUROSEMIDE 10 MG/ML IJ SOLN: 60.0000 mg | Freq: Every day | INTRAMUSCULAR | Status: DC

## 2016-01-16 MED ORDER — CLONIDINE HCL 0.2 MG PO TABS
0.2000 mg | ORAL_TABLET | Freq: Two times a day (BID) | ORAL | Status: DC
  Administered 2016-01-16 – 2016-01-18 (×4): 0.2 mg via ORAL
  Filled 2016-01-16 (×5): qty 1

## 2016-01-16 NOTE — Progress Notes (Signed)
Occupational Therapy Treatment Patient Details Name: Leonard [name] MRN: [id number] DOB: [birth year] Today's Date: 01/16/2016    History of present illness 65 y.o. male presenting with weakness and dizziness . PMH is significant for stage 4 CKD, CHF, HTN, CAD, PVD, hx CABG, COPD, HLD, anemia,  hypothyroidism, T2DM   OT comments  Pt making progress towards OT goals this session. Pt still demonstrating impulsiveness and needing vc for safety with RW but no LOB this session during in room ambulation in between ADL activities. Pt will continue to benefit from skilled OT in the acute care setting prior to d/c to venue below. Next session to focus on tub transfer and family education (if present) for safety with transfers.   Follow Up Recommendations  No OT follow up;Supervision/Assistance - 24 hour (initially)    Equipment Recommendations  Tub/shower seat;Other (comment) (unsure if Pt has one)    Recommendations for Other Services      Precautions / Restrictions Precautions Precautions: Fall Restrictions Weight Bearing Restrictions: No       Mobility Bed Mobility Overal bed mobility: Needs Assistance Bed Mobility: Supine to Sit     Supine to sit: Supervision        Transfers Overall transfer level: Needs assistance Equipment used: Rolling walker (2 wheeled) Transfers: Sit to/from Stand Sit to Stand: Min guard         General transfer comment: no LOB during room ambulation     Balance Overall balance assessment: Needs assistance Sitting-balance support: No upper extremity supported;Feet supported Sitting balance-Leahy Scale: Good     Standing balance support: No upper extremity supported;During functional activity Standing balance-Leahy Scale: Fair Standing balance comment: standing sink level ADL with no LOB                   ADL Overall ADL's : Needs assistance/impaired Eating/Feeding: Independent Eating/Feeding Details (indicate cue type and reason):  eating dinner Grooming: Wash/dry hands;Wash/dry face;Standing;Min guard Grooming Details (indicate cue type and reason): sink level ADL                 Toilet Transfer: Min guard;Comfort height toilet Toilet Transfer Details (indicate cue type and reason): simulated with recliner         Functional mobility during ADLs: Min guard General ADL Comments: Pt still impulsive. No LOB this session when ambulating around room with therapist.      Vision                     Perception     Praxis      Cognition   Behavior During Therapy: Impulsive Overall Cognitive Status: No family/caregiver present to determine baseline cognitive functioning                       Extremity/Trunk Assessment               Exercises     Shoulder Instructions       General Comments      Pertinent Vitals/ Pain       Pain Assessment: No/denies pain  Home Living                                          Prior Functioning/Environment              Frequency  Min 2X/week  Progress Toward Goals  OT Goals(current goals can now be found in the care plan section)  Progress towards OT goals: Progressing toward goals  Acute Rehab OT Goals Patient Stated Goal: go home with sister OT Goal Formulation: With patient Time For Goal Achievement: 01/29/16 Potential to Achieve Goals: Good  Plan Discharge plan remains appropriate    Co-evaluation                 End of Session Equipment Utilized During Treatment: Gait belt;Rolling walker   Activity Tolerance Patient tolerated treatment well   Patient Left in bed;with bed alarm set;with call bell/phone within reach   Nurse Communication Mobility status        Time: 3338-3291 OT Time Calculation (min): 11 min  Charges: OT General Charges $OT Visit: 1 Procedure OT Treatments $Self Care/Home Management : 8-22 mins  Merri Ray [place] Welliver 01/16/2016, 6:11 PM  [name]  OTR/L [phone]

## 2016-01-16 NOTE — Progress Notes (Signed)
Physical Therapy Treatment Patient Details Name: EUTIMIO GHARIBIAN MRN: [id number] DOB: 02-Apr-1950 Today's Date: 01/16/2016    History of Present Illness 65 y.o. male presenting with weakness and dizziness . PMH is significant for stage 4 CKD, CHF, HTN, CAD, PVD, hx CABG, COPD, HLD, anemia,  hypothyroidism, T2DM    PT Comments    Pt admitted with above diagnosis. Pt currently with functional limitations due to balance and endurance deficits. Pt was able to ambulate in halls but unsteady gait at times needing RW and cues for safety.  Pt needed to sit as well after 350 feet due to fatigues.  Will continue PT.   Pt will benefit from skilled PT to increase their independence and safety with mobility to allow discharge to the venue listed below.    Follow Up Recommendations  Supervision/Assistance - 24 hour;No PT follow up     Equipment Recommendations  None recommended by PT    Recommendations for Other Services       Precautions / Restrictions Precautions Precautions: Fall Restrictions Weight Bearing Restrictions: No    Mobility  Bed Mobility Overal bed mobility: Needs Assistance Bed Mobility: Supine to Sit     Supine to sit: Supervision        Transfers Overall transfer level: Needs assistance Equipment used: Rolling walker (2 wheeled) Transfers: Sit to/from Stand Sit to Stand: Min guard         General transfer comment: LOB with turns  Ambulation/Gait Ambulation/Gait assistance: Min guard;Min assist;+2 safety/equipment Ambulation Distance (Feet): 350 Feet Assistive device: Rolling walker (2 wheeled) Gait Pattern/deviations: Step-through pattern;Staggering right;Staggering left;Narrow base of support;Trunk flexed;Drifts right/left     General Gait Details: patient impulsive during ambulation, cues for safety with use of RW, some noted instability with modest LOB but able to self correct.  Pt with poor awareness of surroundings.  Asked to sit once he went 350  feet as his legs felt like "tender".  Brought chair to pt.    Stairs            Wheelchair Mobility    Modified Rankin (Stroke Patients Only)       Balance Overall balance assessment: Needs assistance   Sitting balance-Leahy Scale: Good     Standing balance support: No upper extremity supported;During functional activity Standing balance-Leahy Scale: Fair Standing balance comment: can stand statically without UE support without LOB.              High level balance activites: Direction changes;Turns;Sudden stops High Level Balance Comments: min guard to min assist for safety and stability with higher level balance tasks    Cognition Arousal/Alertness: Awake/alert Behavior During Therapy: Impulsive Overall Cognitive Status: No family/caregiver present to determine baseline cognitive functioning                      Exercises      General Comments        Pertinent Vitals/Pain Pain Assessment: No/denies pain  VSS    Home Living                      Prior Function            PT Goals (current goals can now be found in the care plan section) Acute Rehab PT Goals Patient Stated Goal: gohome Progress towards PT goals: Progressing toward goals    Frequency    Min 3X/week      PT Plan Current plan remains appropriate  Co-evaluation             End of Session Equipment Utilized During Treatment: Gait belt Activity Tolerance: Patient tolerated treatment well;Patient limited by fatigue Patient left: in chair;with call bell/phone within reach;with chair alarm set     Time: 1127-1140 PT Time Calculation (min) (ACUTE ONLY): 13 min  Charges:  $Gait Training: 8-22 mins                    G Codes:      Denice Paradise 01-29-2016, 1:22 PM [place] Geza Beranek,PT Acute Rehabilitation [phone] (pager)

## 2016-01-16 NOTE — Progress Notes (Signed)
Family Medicine Teaching Service Daily Progress Note Intern Pager: [phone]  [name] Medical record number: [id number] Date of birth: Jul 11, 1950 Age: 65 y.o. Gender: male  Primary Care Provider: [name], MD Consultants: none Code Status: full  Pt Overview and Major Events to Date:  12/11- admitted to [facility] with hypothermia 12/12- transferred to med-surg floor  Assessment and Plan: Leonard [name] is a 65 y.o. male presenting with weakness and dizziness . PMH is significant for stage 4 CKD, CHF, HTN, CAD, PVD, hx CABG, COPD, HLD, anemia,  hypothyroidism, T2DM  Concern for UTI- urinalysis with large leukocytes, many bacteria, sent for culture -continue vanc/zosyn empirically for now -blood cultures no growth x 2 days -urine cultures reincubated for better growth  -PT/OT consult recommending 24 hour supervision initially, no follow up needed  Hypothermia- resolved- hypothermic to 93.2 on arrival to ED. Unclear etiolgoy. Possibly related to UTI or uremia on admission. -work up UTI, antibiotics as above  Acute on CKD stage 4- creatinine on admission 3.87. Potassium 3.9. Anion gap 14. Baseline appears to be 3.0 over past several months. Possibly 2/2 dehydration vs infection. Creatinine 3.75 > 3.74>3.73 this morning -continue to monitor -avoid nephrotoxic agents -consider consult to nephrology if no improvement in Cr or if worsening mentation  COPD- on albuterol inhaler, advair at home. ABG with pH 7.47, CO2 24.7, O2 47.7, Bicarb 20 -dulera -albuterol as needed -duonebs q6 hours  HFpEF- last echo May 2017, EF 97-67%, grade 2 diastolic dysfunction. BNP on admission 254. Recheck of BNP 511.8 -s/p two doses of IV lasix, out 1L since admission. -continue IV diuretics as creatinine tolerates -Continue home meds  HTN- takes coreg 50mg  BID, clonidine 0.3 BID, hydralazine 100mg  TID, nifedipine 120mg  daily, torsemide 20mg  daily at home. BP 131/51  overnight. -continue home medications -monitor BP closely  T2DM- A1c 9.7 on 01/09/16. Takes lantus 60 units every night and 20 units novolog TID. Fasting sugar this morning 175. -Lantus 30 units -moderate SSI -CBG AC/HS  Anemia- stable- hemoglobin on admission 9.0. Baseline appears to be 9. -continue to monitor -hemoglobin 8.4 this morning  FEN/GI: heart healthy carb modified Prophylaxis: heparin  Disposition: home pending clinical improvement  Subjective:  Leonard [name]. He appears tired. Sister at bedside. She feels he does not appear his usual self. He denies dysuria, shortness of breath, pain.   Objective: Temp:  [97.9 F (36.6 C)-98.6 F (37 C)] 97.9 F (36.6 C) (12/13 0624) Pulse Rate:  [62-68] 68 (12/13 0831) Resp:  [17-18] 17 (12/13 0831) BP: (130-135)/(50-59) 131/50 (12/13 0831) SpO2:  [91 %-98 %] 94 % (12/13 0932) Weight:  [220 lb 3.8 oz (99.9 kg)] 220 lb 3.8 oz (99.9 kg) (12/13 0500) Physical Exam: General: Elderly gentleman, laying in bed in NAD Cardiovascular: RRR no MRG Respiratory: crackles auscultated on right lung base, no wheezes auscultated, no increased work of breathing Abdomen: soft, non-tender, +BS Extremities: no edema or cyanosis  Laboratory:  Recent Labs Lab 01/14/16 0517 01/15/16 0213 01/16/16 0448  WBC 6.5 6.8 6.1  HGB 8.2* 8.4* 8.4*  HCT 26.6* 27.6* 27.8*  PLT 171 181 188    Recent Labs Lab 01/13/16 2316 01/14/16 0517 01/15/16 0213 01/16/16 0448  NA  --  135 137 141  K  --  4.3 4.2 3.8  CL  --  105 106 109  CO2  --  17* 21* 20*  BUN  --  75* 68* 61*  CREATININE  --  3.75*  3.74* 3.73*  CALCIUM  --  8.0* 8.3* 8.6*  PROT 7.1  --   --   --   BILITOT 0.4  --   --   --   ALKPHOS 102  --   --   --   ALT 20  --   --   --   AST 33  --   --   --   GLUCOSE  --  190* 241* 168*    BNP 254 > 511 Troponin 0 A1C 9.7 TSH 3.303  Imaging/Diagnostic Tests: No results found.   Leonard Rattler, DO 01/16/2016,  9:42 AM PGY-1, [place] Intern pager: [phone], text pages welcome

## 2016-01-16 NOTE — Progress Notes (Signed)
Inpatient Diabetes Program Recommendations  AACE/ADA: New Consensus Statement on Inpatient Glycemic Control (2015)  Target Ranges:  Prepandial:   less than 140 mg/dL      Peak postprandial:   less than 180 mg/dL (1-2 hours)      Critically ill patients:  140 - 180 mg/dL   Lab Results  Component Value Date   GLUCAP 227 (H) 01/16/2016   HGBA1C 9.7 01/09/2016    Review of Glycemic Control  Results for PLES, TRUDEL (MRN [id number]) as of 01/16/2016 12:18  Ref. Range 01/15/2016 11:37 01/15/2016 16:49 01/15/2016 21:50 01/16/2016 07:57 01/16/2016 12:08  Glucose-Capillary Latest Ref Range: 65 - 99 mg/dL 256 (H) 295 (H) 276 (H) 175 (H) 227 (H)    Diabetes history: Type 2 Outpatient Diabetes medications: Novolog 35 units tid, Lantus 60 units qhs Current orders for Inpatient glycemic control: Lantus 40 units qhs, Novolog 0-9 units tid, Novolog 0-5 units qhs  Inpatient Diabetes Program Recommendations:  Please consider adding Novolog  3 units tid with meals (hold if patient eats less than 50%) .  Gentry Fitz, RN, BA, MHA, CDE Diabetes Coordinator Inpatient Diabetes Program  [phone] (Team Pager) [phone] ([place]) 01/16/2016 12:21 PM

## 2016-01-16 NOTE — Consult Note (Signed)
AUTHOR HATLESTAD Admit Date: 01/13/2016 01/16/2016 Rexene Agent Requesting Physician:  Cisne  Reason for Consult:  AoCKD HPI:  25M admit 12/10 'not feeling right', 'dizzy', and some blurry vision.    PMH Incudes:  CKD3 progressing into 4, sees [place], last visit 10/2015 began conversations about AVF placement.  Presumed multifactorial including Longstanding HTN, CHF, DM2, ASCVD  ASCVD s/p 3V CABG, PAD hx/o toe ampuation  HTN since teens, resistant; not on ACEi or ARB currenlty. Torsemide 40 BID is chornic and unchanged  DM2  COPD  dCHF sees Aundra Dubin  ? MGUS, follows with Gorsuch  Chronic Anemia Rx ESA with Alvy Bimler  Has has progressive of SCr into 3s and on this admission at 3.7, not changing.  BUN 60s, K ok, mild acidosis.  Hb 8.4.  For a period during this admit was on PO and IV diuretics.    Is beign treate for UTI on vanc/zosyn.  B and U Cx NGTD.    Pt states feels much improved and back to his baseline.  BP WNL. AFebrile  Good UOP.   Eating well.  No N/V/D   Creatinine (mg/dL)  Date Value  08/16/2015 2.7 (H)  07/05/2015 3.2 (HH)  01/15/2015 2.7 (H)   Creat (mg/dL)  Date Value  01/09/2016 3.64 (H)  09/25/2015 2.33 (H)  08/20/2015 2.64 (H)  07/20/2015 2.46 (H)  07/03/2015 2.91 (H)  05/03/2015 2.93 (H)  03/23/2015 2.75 (H)  12/12/2014 2.27 (H)  11/28/2014 2.17 (H)  11/24/2014 2.80 (H)   Creatinine, Ser (mg/dL)  Date Value  01/16/2016 3.73 (H)  01/15/2016 3.74 (H)  01/14/2016 3.75 (H)  01/13/2016 3.87 (H)  12/05/2015 3.05 (H)  11/26/2015 3.05 (H)  11/21/2015 2.60 (H)  11/07/2015 2.26 (H)  10/24/2015 2.51 (H)  10/23/2015 2.42 (H)  ] I/Os: I/O last 3 completed shifts: In: 1412 [P.O.:1262; IV Piggyback:150] Out: 3125 [Urine:3125]   ROS NSAIDS: denies use PTA IV Contrast no exposure TMP/SMX no exposure Hypotension no exposure Balance of 12 systems is negative w/ exceptions as above  PMH  Past Medical History:  Diagnosis Date   . Cancer ([place])    skin cancer-  . CHF (congestive heart failure) ([place])   . Coronary artery disease   . Gangrene of toe ([place])    right 5th/notes 01/04/2013   . High cholesterol   . Hypertension   . Iron deficiency anemia 08/03/2015  . 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 PAD (peripheral artery disease) ([place])   . Shortness of breath dyspnea    with exertion  . Type II diabetes mellitus ([facility])    Type II   PSH  Past Surgical History:  Procedure Laterality Date  . AMPUTATION Right 01/28/2013   Procedure: RAY AMPUTATION RIGHT  4th & 5th TOE;  Surgeon: Rosetta Posner, MD;  Location: [facility];  Service: Vascular;  Laterality: Right;  . ANGIOPLASTY / STENTING FEMORAL Right 01/13/2013   superficial femoral artery x 2 (3 mm x 60 mm, 4 mm x 60 mm)  . COLONOSCOPY W/ POLYPECTOMY    . CORONARY ARTERY BYPASS GRAFT N/A 04/07/2013   Procedure: CORONARY ARTERY BYPASS GRAFTING (CABG);  Surgeon: Ivin Poot, MD;  Location: [place];  Service: Open Heart Surgery;  Laterality: N/A;  . INTRAOPERATIVE TRANSESOPHAGEAL ECHOCARDIOGRAM N/A 04/07/2013   Procedure: INTRAOPERATIVE TRANSESOPHAGEAL ECHOCARDIOGRAM;  Surgeon: Ivin Poot, MD;  Location: [place];  Service: Open Heart Surgery;  Laterality: N/A;  . LEFT HEART CATHETERIZATION WITH CORONARY ANGIOGRAM N/A 04/05/2013  Procedure: LEFT HEART CATHETERIZATION WITH CORONARY ANGIOGRAM;  Surgeon: Peter M Martinique, MD;  Location: [facility] CATH LAB;  Service: Cardiovascular;  Laterality: N/A;  . LOWER EXTREMITY ANGIOGRAM Bilateral 01/06/2013   Procedure: LOWER EXTREMITY ANGIOGRAM;  Surgeon: Conrad Churchill, MD;  Location: [facility] CATH LAB;  Service: Cardiovascular;  Laterality: Bilateral;  . LOWER EXTREMITY ANGIOGRAM Right 01/13/2013   Procedure: LOWER EXTREMITY ANGIOGRAM;  Surgeon: Conrad Cl[place] Grove, MD;  Location: [facility] CATH LAB;  Service: Cardiovascular;  Laterality: Right;  . SKIN CANCER EXCISION  2017  . THROAT SURGERY  1983   polyps  . TONSILLECTOMY AND  ADENOIDECTOMY  ~ 1   FH  Family History  Problem Relation Age of Onset  . Cancer Mother     lung cancer  . Hypertension Mother   . Cancer Sister     patient thinks it was uterine cancer  . Hypertension Sister   . Heart attack Sister   . Stroke Neg Hx    SH  reports that he quit smoking about 2 years ago. His smoking use included Cigarettes. He started smoking about 48 years ago. He has a 46.00 pack-year smoking history. He has never used smokeless tobacco. He reports that he does not drink alcohol or use drugs. Allergies No Known Allergies Home medications Prior to Admission medications   Medication Sig Start Date End Date Taking? Authorizing Provider  ADVAIR DISKUS 250-50 MCG/DOSE AEPB INHALE ONE PUFF INTO THE LUNGS TWO TIMESDAILY 11/22/15  Yes [name], MD  aspirin 81 MG tablet TAKE ONE (1) TABLET BY MOUTH EVERY DAY Patient taking differently: TAKE 81 MG BY MOUTH EVERY DAY 10/18/15  Yes [name], MD  atorvastatin (LIPITOR) 40 MG tablet TAKE 1 TABLET BY MOUTH EVERY DAY AT 6 PM Patient taking differently: Take 40 mg by mouth daily at 6 PM.  11/07/15  Yes Shirley Friar, PA-C  carvedilol (COREG) 25 MG tablet Take 2 tablets (50 mg total) by mouth 2 (two) times daily with a meal. 10/23/15  Yes Shirley Friar, PA-C  cloNIDine (CATAPRES) 0.3 MG tablet Take 1 tablet (0.3 mg total) by mouth 2 (two) times daily. 09/28/15  Yes Larey Dresser, MD  doxazosin (CARDURA) 8 MG tablet Take 1 tablet (8 mg total) by mouth 2 (two) times daily. 01/07/16  Yes Larey Dresser, MD  hydrALAZINE (APRESOLINE) 100 MG tablet TAKE ONE TABLET BY MOUTH EVERY EIGHT HOURS Patient taking differently: Take 100 mg by mouth every 8 (eight) hours.  10/23/15  Yes Shirley Friar, PA-C  insulin aspart (NOVOLOG FLEXPEN) 100 UNIT/ML FlexPen Inject 20 Units into the skin 3 (three) times daily with meals. Patient taking differently: Inject 35 Units into the skin 3 (three) times daily  with meals.  01/09/16  Yes [name], MD  Insulin Glargine (LANTUS SOLOSTAR) 100 UNIT/ML Solostar Pen Inject 60 Units into the skin daily. 09/14/15  Yes Zenia Resides, MD  isosorbide mononitrate (IMDUR) 60 MG 24 hr tablet TAKE ONE (1) TABLET BY MOUTH EVERY DAY Patient taking differently: Take 60 mg by mouth daily.  08/02/15  Yes Rosemarie Ax, MD  levothyroxine (SYNTHROID, LEVOTHROID) 50 MCG tablet TAKE ONE (1) TABLET BY MOUTH EVERY DAY BEFORE BREAKFAST Patient taking differently: TAKE 50 MCG BY MOUTH EVERY DAY BEFORE BREAKFAST 09/26/15  Yes [name], MD  NIFEdipine (PROCARDIA XL/ADALAT-CC) 60 MG 24 hr tablet Take 2 tablets (120 mg total) by mouth daily. 01/07/16  Yes Larey Dresser, MD  nitroGLYCERIN (  NITROSTAT) 0.4 MG SL tablet Place 1 tablet (0.4 mg total) under the tongue every 5 (five) minutes as needed for chest pain. 06/06/15  Yes Mercy Riding, MD  pantoprazole (PROTONIX) 40 MG tablet TAKE 1 TABLET BY MOUTH DAILY Patient taking differently: TAKE 40 MG BY MOUTH DAILY 10/18/15  Yes [name], MD  potassium chloride SA (K-DUR,KLOR-CON) 20 MEQ tablet Take 70meq (1 tablet ) in the AM and 29meq (1/2 tablet) in the PM Patient taking differently: Take 10-20 mEq by mouth See admin instructions. Take 18meq (1 tablet ) in the AM and 38meq (1/2 tablet) in the PM 11/21/15  Yes Shirley Friar, PA-C  torsemide (DEMADEX) 20 MG tablet Take 2 tablets (40 mg total) by mouth 2 (two) times daily. Patient taking differently: Take 40-60 mg by mouth See admin instructions. Take 60 mg by mouth in the morning and take 40 mg by mouth in the evening 11/26/15  Yes Satira Mccallum Tillery, PA-C  PROVENTIL HFA 108 (90 Base) MCG/ACT inhaler INHALE 2 PUFFS INTO THE LUNGS EVERY 6 HOURS AS NEEDED FOR WHEEZING ORSHORTNESS OF BREATH 09/25/15   [name], MD  SURE COMFORT PEN NEEDLES 31G X 8 MM MISC USE WITH BOTH INSULIN PENS TWICE DAILY (4 PER DAY) Patient not taking: Reported on  01/14/2016 06/14/15   Olin Hauser, DO    Current Medications Scheduled Meds: . aspirin EC  81 mg Oral Daily  . atorvastatin  40 mg Oral q1800  . carvedilol  50 mg Oral BID WC  . cloNIDine  0.2 mg Oral BID  . doxazosin  8 mg Oral BID  . heparin  5,000 Units Subcutaneous Q8H  . hydrALAZINE  100 mg Oral Q8H  . insulin aspart  0-5 Units Subcutaneous QHS  . insulin aspart  0-9 Units Subcutaneous TID WC  . insulin glargine  40 Units Subcutaneous QHS  . ipratropium-albuterol  3 mL Nebulization BID  . isosorbide mononitrate  60 mg Oral Daily  . levothyroxine  50 mcg Oral QAC breakfast  . mometasone-formoterol  2 puff Inhalation BID  . NIFEdipine  120 mg Oral Daily  . pantoprazole  40 mg Oral Daily  . piperacillin-tazobactam (ZOSYN)  IV  3.375 g Intravenous Q8H  . potassium chloride SA  20 mEq Oral BID  . sodium chloride flush  3 mL Intravenous Q12H  . torsemide  40 mg Oral BID   Continuous Infusions: PRN Meds:.albuterol  CBC  Recent Labs Lab 01/13/16 2306 01/14/16 0517 01/15/16 0213 01/16/16 0448  WBC 9.0 6.5 6.8 6.1  NEUTROABS 7.1  --   --   --   HGB 9.0* 8.2* 8.4* 8.4*  HCT 28.7* 26.6* 27.6* 27.8*  MCV 72.5* 73.1* 73.2* 72.8*  PLT 203 171 181 737   Basic Metabolic Panel  Recent Labs Lab 01/13/16 2306 01/14/16 0517 01/15/16 0213 01/16/16 0448  NA 136 135 137 141  K 3.9 4.3 4.2 3.8  CL 104 105 106 109  CO2 18* 17* 21* 20*  GLUCOSE 91 190* 241* 168*  BUN 76* 75* 68* 61*  CREATININE 3.87* 3.75* 3.74* 3.73*  CALCIUM 8.8* 8.0* 8.3* 8.6*    Physical Exam  Blood pressure 129/77, pulse 65, temperature 98 F (36.7 C), temperature source Oral, resp. rate 18, height 5\' 8"  (1.727 m), weight 99.9 kg (220 lb 3.8 oz), SpO2 96 %. GEN: NAD ENT: NCAT EYES: EOMI CV: RRR PULM: CTAB, nl wob, E>I ABD: s/nt/nd. No bruits SKIN: no rashes/lesions EXT:no LEE  Assessment 32M with progressive CKD4, admitting with dizziness.  1. Progressive CKD4, doubt this is  acute insult; not uremic 2. HTN wel lcontrolled, resistant 3. dCHF 4. ASCVD s/p CABG, PVD 5. Dizziness, appears improved 6. COPD 7. DM2   Plan 1. No medication changes 2. WIll need close outpt f/u to obtain outpatietn AVF 3. Daily weights, Daily Renal Panel, Strict I/Os, Avoid nephrotoxins (NSAIDs, judicious IV Contrast)    Pearson Grippe MD [phone] pgr 01/16/2016, 4:58 PM

## 2016-01-17 ENCOUNTER — Ambulatory Visit: Payer: Medicaid Other

## 2016-01-17 ENCOUNTER — Other Ambulatory Visit: Payer: Medicaid Other

## 2016-01-17 DIAGNOSIS — I5032 Chronic diastolic (congestive) heart failure: Secondary | ICD-10-CM

## 2016-01-17 DIAGNOSIS — R400 Somnolence: Secondary | ICD-10-CM

## 2016-01-17 DIAGNOSIS — I10 Essential (primary) hypertension: Secondary | ICD-10-CM

## 2016-01-17 LAB — BASIC METABOLIC PANEL
ANION GAP: 12 (ref 5–15)
BUN: 52 mg/dL — ABNORMAL HIGH (ref 6–20)
CALCIUM: 8.3 mg/dL — AB (ref 8.9–10.3)
CHLORIDE: 107 mmol/L (ref 101–111)
CO2: 21 mmol/L — AB (ref 22–32)
Creatinine, Ser: 3.64 mg/dL — ABNORMAL HIGH (ref 0.61–1.24)
GFR calc Af Amer: 19 mL/min — ABNORMAL LOW (ref >60)
GFR calc non Af Amer: 16 mL/min — ABNORMAL LOW (ref >60)
GLUCOSE: 169 mg/dL — AB (ref 65–99)
Potassium: 4.1 mmol/L (ref 3.5–5.1)
Sodium: 140 mmol/L (ref 135–145)

## 2016-01-17 LAB — HEMOGLOBIN AND HEMATOCRIT, BLOOD
HEMATOCRIT: 28 % — AB (ref 39.0–52.0)
Hemoglobin: 8.4 g/dL — ABNORMAL LOW (ref 13.0–17.0)

## 2016-01-17 LAB — URINE CULTURE

## 2016-01-17 LAB — CBC
HEMATOCRIT: 26 % — AB (ref 39.0–52.0)
Hemoglobin: 7.9 g/dL — ABNORMAL LOW (ref 13.0–17.0)
MCH: 21.9 pg — ABNORMAL LOW (ref 26.0–34.0)
MCHC: 30.4 g/dL (ref 30.0–36.0)
MCV: 72 fL — AB (ref 78.0–100.0)
Platelets: 173 10*3/uL (ref 150–400)
RBC: 3.61 MIL/uL — ABNORMAL LOW (ref 4.22–5.81)
RDW: 18.9 % — AB (ref 11.5–15.5)
WBC: 5.3 10*3/uL (ref 4.0–10.5)

## 2016-01-17 LAB — GLUCOSE, CAPILLARY
GLUCOSE-CAPILLARY: 198 mg/dL — AB (ref 65–99)
GLUCOSE-CAPILLARY: 211 mg/dL — AB (ref 65–99)
Glucose-Capillary: 194 mg/dL — ABNORMAL HIGH (ref 65–99)
Glucose-Capillary: 202 mg/dL — ABNORMAL HIGH (ref 65–99)

## 2016-01-17 MED ORDER — SULFAMETHOXAZOLE-TRIMETHOPRIM 800-160 MG PO TABS: 1.0000 | ORAL_TABLET | Freq: Two times a day (BID) | ORAL | 0 refills | Status: AC

## 2016-01-17 MED ORDER — IPRATROPIUM-ALBUTEROL 0.5-2.5 (3) MG/3ML IN SOLN: 3.0000 mL | Freq: Four times a day (QID) | RESPIRATORY_TRACT | Status: DC | PRN

## 2016-01-17 MED ORDER — CLONIDINE HCL 0.2 MG PO TABS: 0.2000 mg | ORAL_TABLET | Freq: Two times a day (BID) | ORAL | 0 refills | Status: DC

## 2016-01-17 NOTE — Progress Notes (Signed)
Inpatient Diabetes Program Recommendations  AACE/ADA: New Consensus Statement on Inpatient Glycemic Control (2015)  Target Ranges:  Prepandial:   less than 140 mg/dL      Peak postprandial:   less than 180 mg/dL (1-2 hours)      Critically ill patients:  140 - 180 mg/dL   Lab Results  Component Value Date   GLUCAP 241 (H) 01/16/2016   HGBA1C 9.7 01/09/2016    Review of Glycemic Control  Results for ROBERTLEE, ROGACKI (MRN [id number]) as of 01/17/2016 07:58  Ref. Range 01/15/2016 21:50 01/16/2016 07:57 01/16/2016 12:08 01/16/2016 18:16 01/16/2016 21:25  Glucose-Capillary Latest Ref Range: 65 - 99 mg/dL 276 (H) 175 (H) 227 (H) 252 (H) 241 (H)   Diabetes history: Type 2 Outpatient Diabetes medications: Novolog 35 units tid, Lantus 60 units qhs Current orders for Inpatient glycemic control: Lantus 40 units qhs, Novolog 0-9 units tid, Novolog 0-5 units qhs  Inpatient Diabetes Program Recommendations:  Post prandial blood sugars remain elevated despite current correction insulin- patient takes 35 units  Novolog every meal at home- Please consider adding Novolog  4 units tid with meals (hold if patient eats less than 50%) .  Gentry Fitz, RN, BA, MHA, CDE Diabetes Coordinator Inpatient Diabetes Program  [phone] (Team Pager) [phone] ([place]) 01/17/2016 7:59 AM

## 2016-01-17 NOTE — Progress Notes (Signed)
Pharmacy Antibiotic Note  Leonard [name] is a 65 y.o. male admitted on 01/13/2016 with uti. Patient currently on zosyn day 5 with all cx neg  Plan: Continue zosyn 3.375 gm iv q8h Consider change to PO  Height: 5\' 8"  (172.7 cm) Weight: 216 lb 11.4 oz (98.3 kg) IBW/kg (Calculated) : 68.4  Temp (24hrs), Avg:98 F (36.7 C), Min:97.9 F (36.6 C), Max:98.1 F (36.7 C)   Recent Labs Lab 01/13/16 2306 01/13/16 2338 01/14/16 0256 01/14/16 0517 01/15/16 0213 01/16/16 0448 01/17/16 0354  WBC 9.0  --   --  6.5 6.8 6.1 5.3  CREATININE 3.87*  --   --  3.75* 3.74* 3.73* 3.64*  LATICACIDVEN  --  1.63 0.85  --   --   --   --     Estimated Creatinine Clearance: 23.3 mL/min (by C-G formula based on SCr of 3.64 mg/dL (H)).    No Known Allergies  Levester Fresh, PharmD, BCPS, BCCCP Clinical Pharmacist Clinical phone for 01/17/2016 from 7a-3:30p: [phone] If after 3:30p, please call main pharmacy at: [phone] 01/17/2016 9:01 AM

## 2016-01-17 NOTE — Progress Notes (Signed)
Occupational Therapy Treatment Patient Details Name: Leonard [name] MRN: [id number] DOB: 02/24/1950 Today's Date: 01/17/2016    History of present illness 65 y.o. male presenting with weakness and dizziness . PMH is significant for stage 4 CKD, CHF, HTN, CAD, PVD, hx CABG, COPD, HLD, anemia,  hypothyroidism, T2DM   OT comments  Pt denies dizziness this session, and continues to demonstrate impulsivity at this time. During bathing today, Pt reports that he has an aide that comes 3 days a week and does his bathing for him. Hard to determine if Pt is at baseline because no family present. Pt continues to be willing to work with therapy and is making progress towards OT goals. Pt needed verbal cues throughout the session for safety with RW and for attention to task. Pt continues to benefit from skilled OT in the acute care setting to maximize independence with ADL and safety education.   Follow Up Recommendations  No OT follow up;Supervision/Assistance - 24 hour (initially)    Equipment Recommendations  Tub/shower seat (need family to determine if Pt already has one)    Recommendations for Other Services      Precautions / Restrictions Precautions Precautions: Fall Restrictions Weight Bearing Restrictions: No       Mobility Bed Mobility               General bed mobility comments: Pt sitting OOB in recliner when OT arrived  Transfers Overall transfer level: Needs assistance Equipment used: Rolling walker (2 wheeled) Transfers: Sit to/from Stand Sit to Stand: Min guard         General transfer comment: Pt very impulsive and transfers very quickly up and down. vc to slow down    Balance Overall balance assessment: Needs assistance Sitting-balance support: No upper extremity supported;Feet supported Sitting balance-Leahy Scale: Good     Standing balance support: Single extremity supported;During functional activity Standing balance-Leahy Scale: Fair Standing  balance comment: standing sink level ADL with no LOB                   ADL Overall ADL's : Needs assistance/impaired     Grooming: Wash/dry hands;Wash/dry face;Oral care;Min guard;Standing Grooming Details (indicate cue type and reason): sink level ADL Upper Body Bathing: Moderate assistance;Standing Upper Body Bathing Details (indicate cue type and reason): required therapist to wash back - reports that his aide does it at home Lower Body Bathing: Moderate assistance;Sit to/from stand Lower Body Bathing Details (indicate cue type and reason): Pt required OT to clean his bottom during bathing, reports that his aide does it at home Upper Body Dressing : Set up;Supervision/safety Upper Body Dressing Details (indicate cue type and reason): hospital gown     Toilet Transfer: Min guard;Comfort height toilet (very fast, vc to slow down next time) Toilet Transfer Details (indicate cue type and reason): simulated with recliner Toileting- Clothing Manipulation and Hygiene: Moderate assistance;Sit to/from stand Toileting - Clothing Manipulation Details (indicate cue type and reason): Pt reports that his aide helps him wipe his bottom. Able to reach, but insisted on therapist following up as well. Used warm wash cloth to ensure cleanliness     Functional mobility during ADLs: Min guard;Rolling walker;Cueing for safety (vc to use RW, and for safety) General ADL Comments: Pt still impulsive. No LOB this session when ambulating with RW around room with therapist.      Vision  Perception     Praxis      Cognition   Behavior During Therapy: Impulsive Overall Cognitive Status: No family/caregiver present to determine baseline cognitive functioning                       Extremity/Trunk Assessment               Exercises     Shoulder Instructions       General Comments      Pertinent Vitals/ Pain       Pain Assessment: No/denies  pain  Home Living                                          Prior Functioning/Environment              Frequency  Min 2X/week        Progress Toward Goals  OT Goals(current goals can now be found in the care plan section)  Progress towards OT goals: Progressing toward goals  Acute Rehab OT Goals Patient Stated Goal: go home with sister OT Goal Formulation: With patient Time For Goal Achievement: 01/29/16 Potential to Achieve Goals: Good  Plan Discharge plan remains appropriate    Co-evaluation                 End of Session Equipment Utilized During Treatment: Gait belt;Rolling walker   Activity Tolerance Patient tolerated treatment well   Patient Left in chair;with call bell/phone within reach;with chair alarm set   Nurse Communication Mobility status        Time: 4268-3419 OT Time Calculation (min): 18 min  Charges: OT General Charges $OT Visit: 1 Procedure OT Treatments $Self Care/Home Management : 8-22 mins  Leonard [name] 01/17/2016, 11:40 AM  Leonard [name] OTR/L [phone]

## 2016-01-17 NOTE — Progress Notes (Signed)
Family Medicine Teaching Service Daily Progress Note Intern Pager: [phone]  [name] Medical record number: [id number] Date of birth: 08-10-1950 Age: 65 y.o. Gender: male  Primary Care Provider: [name], MD Consultants: none Code Status: full  Pt Overview and Major Events to Date:  12/11- admitted to [facility] with hypothermia 12/12- transferred to med-surg floor  Assessment and Plan: Leonard [name] is a 65 y.o. male presenting with weakness and dizziness . PMH is significant for stage 4 CKD, CHF, HTN, CAD, PVD, hx CABG, COPD, HLD, anemia,  hypothyroidism, T2DM  Concern for UTI- urinalysis with large leukocytes, many bacteria, sent for culture -continue zosyn empirically for now -blood cultures no growth x 3 days -urine cultures reincubated for better growth, anticipate results today and can adjust antibiotics from there -PT/OT consult recommending 24 hour supervision initially, no follow up needed. May need shower/tub seat at home  Hypothermia- resolved- hypothermic to 93.2 on arrival to ED. Unclear etiolgoy. Possibly related to UTI or uremia on admission. -work up UTI, antibiotics as above  Acute on CKD stage 4- creatinine on admission 3.87. Potassium 3.9. Anion gap 14. Baseline appears to be 3.0 over past several months. Possibly 2/2 dehydration vs infection. Creatinine 3.75 > 3.74>3.73>3.64 this morning -continue to monitor -avoid nephrotoxic agents -nephrology following, would like to arrange for AV fistula as outpatient  COPD- on albuterol inhaler, advair at home. ABG with pH 7.47, CO2 24.7, O2 47.7, Bicarb 20 -continue dulera -albuterol as needed -duonebs q6 hours  HFpEF- last echo May 2017, EF 75-91%, grade 2 diastolic dysfunction. BNP on admission 254. Recheck of BNP 511.8 -Continue home meds -documented output 86 mL -torsemide 40mg  BID  HTN- takes coreg 50mg  BID, hydralazine 100mg  TID, nifedipine 120mg  daily, torsemide 20mg  daily  at home. BP 131/51 overnight. -continue home medications -monitor BP closely -decreased clonidine to 0.2 BID, continue to taper down  T2DM- A1c 9.7 on 01/09/16. Takes lantus 60 units every night and 20 units novolog TID. Fasting sugar this morning 169. -Lantus 30 units -moderate SSI -CBG AC/HS  Anemia- stable- hemoglobin on admission 9.0. Baseline appears to be 9. -continue to monitor -hemoglobin 7.9 this morning, will recheck   FEN/GI: heart healthy carb modified Prophylaxis: heparin  Disposition: home pending clinical improvement  Subjective:  Leonard [name] feels well, wants to go home. Stomach is upset today he feels like he ate too much too fast. No shortness of breath, chest pain, dysuria.   Objective: Temp:  [97.9 F (36.6 C)-98.1 F (36.7 C)] 98.1 F (36.7 C) (12/14 0547) Pulse Rate:  [62-67] 66 (12/14 0845) Resp:  [18] 18 (12/14 0547) BP: (117-140)/(55-77) 140/58 (12/14 0845) SpO2:  [92 %-96 %] 92 % (12/14 0547) Weight:  [216 lb 11.4 oz (98.3 kg)] 216 lb 11.4 oz (98.3 kg) (12/14 0547) Physical Exam: General: Elderly gentleman, laying in bed in NAD Cardiovascular: RRR no MRG Respiratory: clear to auscultation bilaterally no increased work of breathing Abdomen: soft, non-tender, +BS Extremities: no edema or cyanosis  Laboratory:  Recent Labs Lab 01/15/16 0213 01/16/16 0448 01/17/16 0354  WBC 6.8 6.1 5.3  HGB 8.4* 8.4* 7.9*  HCT 27.6* 27.8* 26.0*  PLT 181 188 173    Recent Labs Lab 01/13/16 2316  01/15/16 0213 01/16/16 0448 01/17/16 0354  NA  --   < > 137 141 140  K  --   < > 4.2 3.8 4.1  CL  --   < > 106 109 107  CO2  --   < >  21* 20* 21*  BUN  --   < > 68* 61* 52*  CREATININE  --   < > 3.74* 3.73* 3.64*  CALCIUM  --   < > 8.3* 8.6* 8.3*  PROT 7.1  --   --   --   --   BILITOT 0.4  --   --   --   --   ALKPHOS 102  --   --   --   --   ALT 20  --   --   --   --   AST 33  --   --   --   --   GLUCOSE  --   < > 241* 168* 169*  < > = values in this  interval not displayed.  BNP 254 > 511 Troponin 0 A1C 9.7 TSH 3.303  Imaging/Diagnostic Tests: No results found.   Steve Rattler, DO 01/17/2016, 8:51 AM PGY-1, [place] Intern pager: [phone], text pages welcome

## 2016-01-17 NOTE — Discharge Instructions (Signed)
Food Basics for Chronic Kidney Disease When your kidneys are not working well, they cannot remove waste and excess substances from your blood as effectively as they did before. This can lead to a buildup and imbalance of these substances, which can affect how your body functions. This buildup can also make your kidneys work harder, causing even more damage. You may need to eat less of certain foods that can lead to the buildup of these substances in your body. By making the changes to your diet that are recommended by your dietitian or health care provider, you could possibly help prevent further kidney damage and delay or prevent the need for dialysis. The following information can help give you a basic understanding of these substances and how they affect your bodily functions. The information also gives examples of foods that contain the highest amounts of these substances. WHAT DO I NEED TO KNOW ABOUT SUBSTANCES IN MY FOOD THAT I MAY NEED TO ADJUST? Food adjustments will be different for each person with chronic kidney disease. It is important that you see a dietitian who can help you determine the specific adjustments that you will need to make for each of the following substances: Potassium  Potassium affects how steadily your heart beats. If too much potassium builds up in your blood, it can cause an irregular heartbeat or even a heart attack. Examples of foods rich in potassium include:  Milk.  Fruits.  Vegetables. Phosphorus  Phosphorus is a mineral found in your bones. A balance between calcium and phosphorous is needed to build and maintain healthy bones. Too much phosphorus pulls calcium from your bones. This can make your bones weak and more likely to break. Too much phosphorus can also make your skin itch. Examples of foods rich in phosphorus include:  Milk and cheese.  Dried beans.  Peas.  Colas.  Nuts and peanut butter. Animal Protein  Animal protein helps you make and  keep muscle. It also helps in the repair of your body's cells and tissues. One of the natural breakdown products of protein is a waste product called urea. When your kidneys are not working properly, they cannot remove wastes such as urea like they did before you developed chronic kidney disease. You will likely need to limit the amount of protein you eat to help prevent a buildup of urea in your blood. Examples of animal protein include:  Meat (all types).  Fish and seafood.  Poultry.  Eggs. Sodium  Sodium, which is found in salt, helps maintain a healthy balance of fluids in your body. Too much sodium can increase your blood pressure level and have a negative affect on the function of your heart and lungs. Too much sodium also can cause your body to retain too much fluid, making your kidneys work harder. Examples of foods with high levels of sodium include:  Salt seasonings.  Soy sauce.  Cured and processed meats.  Salted crackers and snack foods.  Fast food.  Canned soups and most canned foods. Glucose  Glucose provides energy for your body. If you have diabetes mellitus that is not properly controlled, you have too much glucose in your blood. Too much glucose in your blood can worsen the function of your kidneys by damaging small blood vessels. This prevents enough blood flow to your kidneys to give them what they need to work. If you have diabetes mellitus and chronic kidney disease, it is important to maintain your blood glucose at a level recommended by your  health care provider. SHOULD I TAKE A VITAMIN AND MINERAL SUPPLEMENT? Because you may need to avoid eating certain foods, you may not get all of the vitamins and minerals that would normally come from those foods. Your health care provider or dietitian may recommend that you take a supplement to ensure that you get all of the vitamins and minerals that your body needs.  This information is not intended to replace advice given  to you by your health care provider. Make sure you discuss any questions you have with your health care provider. Document Released: 04/12/2002 Document Revised: 02/10/2014 Document Reviewed: 12/17/2012 Elsevier Interactive Patient Education  2017 [employer].

## 2016-01-17 NOTE — Progress Notes (Signed)
Admit: 01/13/2016 LOS: 3  Leonard [name] with progressive CKD4, admitting with dizziness.  Subjective:  stable SCr  Says no further dizzyness 3 episodes of diarrhea this AM   12/13 0701 - 12/14 0700 In: 6578 [P.O.:1680; IV Piggyback:150] Out: 878 [Urine:877; Stool:1]  Filed Weights   01/13/16 2324 01/16/16 0500 01/17/16 0547  Weight: 99.8 kg (220 lb) 99.9 kg (220 lb 3.8 oz) 98.3 kg (216 lb 11.4 oz)    Scheduled Meds: . aspirin EC  81 mg Oral Daily  . atorvastatin  40 mg Oral q1800  . carvedilol  50 mg Oral BID WC  . cloNIDine  0.2 mg Oral BID  . doxazosin  8 mg Oral BID  . heparin  5,000 Units Subcutaneous Q8H  . hydrALAZINE  100 mg Oral Q8H  . insulin aspart  0-5 Units Subcutaneous QHS  . insulin aspart  0-9 Units Subcutaneous TID WC  . insulin glargine  40 Units Subcutaneous QHS  . ipratropium-albuterol  3 mL Nebulization BID  . isosorbide mononitrate  60 mg Oral Daily  . levothyroxine  50 mcg Oral QAC breakfast  . mometasone-formoterol  2 puff Inhalation BID  . NIFEdipine  120 mg Oral Daily  . pantoprazole  40 mg Oral Daily  . piperacillin-tazobactam (ZOSYN)  IV  3.375 g Intravenous Q8H  . potassium chloride SA  20 mEq Oral BID  . sodium chloride flush  3 mL Intravenous Q12H  . torsemide  40 mg Oral BID   Continuous Infusions: PRN Meds:.albuterol  Current Labs: reviewed    Physical Exam:  Blood pressure (!) 140/58, pulse 66, temperature 98.1 F (36.7 C), temperature source Oral, resp. rate 18, height 5\' 8"  (1.727 m), weight 98.3 kg (216 lb 11.4 oz), SpO2 91 %. GEN: NAD ENT: NCAT EYES: EOMI CV: RRR PULM: CTAB, nl wob, E>I ABD: s/nt/nd. No bruits SKIN: no rashes/lesions EXT:no LEE  A 1. Progressive CKD4, doubt this is acute insult; not uremic 2. HTN wel lcontrolled, resistant 3. dCHF 4. ASCVD s/p CABG, PVD 5. Dizziness, appears improved 6. COPD 7. DM2 8. Anemia, microcytic; chronic no recent Fe levels; Gorsuch follows and Rx ESA 9. E coli UTI  P 1. Stable  GFR< likely new baseline 2. Will arrange close outpt f/u; needs active plan for AVF   Pearson Grippe MD 01/17/2016, 10:42 AM   Recent Labs Lab 01/15/16 0213 01/16/16 0448 01/17/16 0354  NA 137 141 140  K 4.2 3.8 4.1  CL 106 109 107  CO2 21* 20* 21*  GLUCOSE 241* 168* 169*  BUN 68* 61* 52*  CREATININE 3.74* 3.73* 3.64*  CALCIUM 8.3* 8.6* 8.3*    Recent Labs Lab 01/13/16 2306  01/15/16 0213 01/16/16 0448 01/17/16 0354  WBC 9.0  < > 6.8 6.1 5.3  NEUTROABS 7.1  --   --   --   --   HGB 9.0*  < > 8.4* 8.4* 7.9*  HCT 28.7*  < > 27.6* 27.8* 26.0*  MCV 72.5*  < > 73.2* 72.8* 72.0*  PLT 203  < > 181 188 173  < > = values in this interval not displayed.

## 2016-01-17 NOTE — Discharge Summary (Signed)
[facility] Discharge Summary  [name] Medical record number: [id number] Date of birth: October 02, 1950 Age: 65 y.o. Gender: male Date of Admission: 01/13/2016  Date of Discharge: 01/18/16  Admitting Physician: Lupita Dawn, MD  Primary Care Provider: [name], MD Consultants: nephrology  Indication for Hospitalization: weakness  Discharge Diagnoses/Problem List:  UTI CKD CHF HTN  Disposition: home  Discharge Condition: stable, improved  Discharge Exam:  Gen: elderly man laying in bed in NAD Card: RRR no MRG Respiratory: CTA bilaterally no increased work of breathing Abdomen: soft, non-tender, non-distended, BS present Extremities: no edema or cyanosis  Brief Hospital Course:   Leonard [name] is a 65 year old male with a PMH of Stage 4 CKD, CHF, HTN, CD, PVD, COPD, HLD, anemia, T2DM and hypothyroidism who presented to [facility] ED with weakness and hypothermia. He was admitted to [facility] for management. He was started on vancomycin and zosyn empirically for concern for sepsis. He was placed on bair hugger with improvement in his temperature. His altered mental status improved after temperature was corrected. His urine culture grew >100,000 CFU of E. Coli. He was transitioned from IV antibiotics to oral Bactrim to complete a 3 day course as an outpatient. Hypothyroidism was ruled out as a cause for his hypothermia with a normal TSH and patient was continued on his home dose of Synthroid. He was noted to have worsening kidney function as well as crackles in his right lung and lower extremity edema. CXR showed fluid in the right lung fissure. He was diuresed with IV lasix with improvement in his physical exam findings. He was then transitioned back to his home dose of Torsemide. His Creatinine remained elevated and nephrology was consulted. Nephrology recommended he follow with them outpatient closely to develop plans for AV  fistula placement in anticipation of future dialysis needs. His chronic medical problems were stable during admission and his home medicines were continued. He was discharged home in stable condition on 01/18/16 with close PCP follow up.   Issues for Follow Up:  1. Ensure UTI has resolved and patient completed 3 day course of Bactrim 2. Recommend checking Hemoglobin, patient is chronically anemic with baseline of 9, on day of discharge hemoglobin was 8.4 3. Follow up CKD and need for AV fistula as outpatient 4. Decreased Clonidine from 0.3mg  BID to 0.2mg  BID in hospital due to somnolence. Can decrease weekly by 0.1 mg to taper patient off this to improve somnolence, if blood pressure allows.  Significant Procedures: none  Significant Labs and Imaging:   Recent Labs Lab 01/15/16 0213 01/16/16 0448 01/17/16 0354 01/17/16 1033  WBC 6.8 6.1 5.3  --   HGB 8.4* 8.4* 7.9* 8.4*  HCT 27.6* 27.8* 26.0* 28.0*  PLT 181 188 173  --     Recent Labs Lab 01/13/16 2306 01/13/16 2316 01/14/16 0517 01/15/16 0213 01/16/16 0448 01/17/16 0354  NA 136  --  135 137 141 140  K 3.9  --  4.3 4.2 3.8 4.1  CL 104  --  105 106 109 107  CO2 18*  --  17* 21* 20* 21*  GLUCOSE 91  --  190* 241* 168* 169*  BUN 76*  --  75* 68* 61* 52*  CREATININE 3.87*  --  3.75* 3.74* 3.73* 3.64*  CALCIUM 8.8*  --  8.0* 8.3* 8.6* 8.3*  ALKPHOS  --  102  --   --   --   --   AST  --  33  --   --   --   --   ALT  --  20  --   --   --   --   ALBUMIN  --  3.6  --   --   --   --     Results/Tests Pending at Time of Discharge: none  Discharge Medications:  Allergies as of 01/18/2016   No Known Allergies     Medication List    TAKE these medications   ADVAIR DISKUS 250-50 MCG/DOSE Aepb Generic drug:  Fluticasone-Salmeterol INHALE ONE PUFF INTO THE LUNGS TWO TIMESDAILY   aspirin 81 MG tablet TAKE ONE (1) TABLET BY MOUTH EVERY DAY What changed:  See the new instructions.   atorvastatin 40 MG tablet Commonly  known as:  LIPITOR TAKE 1 TABLET BY MOUTH EVERY DAY AT 6 PM What changed:  how much to take  how to take this  when to take this  additional instructions   carvedilol 25 MG tablet Commonly known as:  COREG Take 2 tablets (50 mg total) by mouth 2 (two) times daily with a meal.   cloNIDine 0.2 MG tablet Commonly known as:  CATAPRES Take 1 tablet (0.2 mg total) by mouth 2 (two) times daily. What changed:  medication strength  how much to take   doxazosin 8 MG tablet Commonly known as:  CARDURA Take 1 tablet (8 mg total) by mouth 2 (two) times daily.   hydrALAZINE 100 MG tablet Commonly known as:  APRESOLINE TAKE ONE TABLET BY MOUTH EVERY EIGHT HOURS What changed:  how much to take  how to take this  when to take this  additional instructions   insulin aspart 100 UNIT/ML FlexPen Commonly known as:  NOVOLOG FLEXPEN Inject 20 Units into the skin 3 (three) times daily with meals. What changed:  how much to take   isosorbide mononitrate 60 MG 24 hr tablet Commonly known as:  IMDUR TAKE ONE (1) TABLET BY MOUTH EVERY DAY What changed:  how much to take  how to take this  when to take this  additional instructions   LANTUS SOLOSTAR 100 UNIT/ML Solostar Pen Generic drug:  Insulin Glargine Inject 60 Units into the skin daily.   levothyroxine 50 MCG tablet Commonly known as:  SYNTHROID, LEVOTHROID TAKE ONE (1) TABLET BY MOUTH EVERY DAY BEFORE BREAKFAST What changed:  See the new instructions.   NIFEdipine 60 MG 24 hr tablet Commonly known as:  PROCARDIA XL/ADALAT-CC Take 2 tablets (120 mg total) by mouth daily.   nitroGLYCERIN 0.4 MG SL tablet Commonly known as:  NITROSTAT Place 1 tablet (0.4 mg total) under the tongue every 5 (five) minutes as needed for chest pain.   pantoprazole 40 MG tablet Commonly known as:  PROTONIX TAKE 1 TABLET BY MOUTH DAILY What changed:  See the new instructions.   potassium chloride SA 20 MEQ tablet Commonly known as:   K-DUR,KLOR-CON Take 54meq (1 tablet ) in the AM and 14meq (1/2 tablet) in the PM What changed:  how much to take  how to take this  when to take this  additional instructions   PROVENTIL HFA 108 (90 Base) MCG/ACT inhaler Generic drug:  albuterol INHALE 2 PUFFS INTO THE LUNGS EVERY 6 HOURS AS NEEDED FOR WHEEZING ORSHORTNESS OF BREATH   sulfamethoxazole-trimethoprim 800-160 MG tablet Commonly known as:  BACTRIM DS,SEPTRA DS Take 1 tablet by mouth 2 (two) times daily.   SURE COMFORT PEN NEEDLES 31G X 8 MM Misc Generic drug:  Insulin Pen Needle USE WITH BOTH INSULIN PENS TWICE DAILY (4 PER DAY)   torsemide 20 MG tablet Commonly known as:  DEMADEX Take 2 tablets (40 mg total) by mouth 2 (two) times daily. What changed:  how much to take  when to take this  additional instructions       Discharge Instructions: Please refer to Patient Instructions section of EMR for full details.  Patient was counseled important signs and symptoms that should prompt return to medical care, changes in medications, dietary instructions, activity restrictions, and follow up appointments.   Follow-Up Appointments: Follow-up Information    [name], MD Follow up on 01/22/2016.   Specialty:  Family Medicine Why:  at 11 am Contact information: [place] [id number] [phone]           Steve Rattler, DO 01/18/2016, 9:37 AM PGY-1, [place]

## 2016-01-18 LAB — GLUCOSE, CAPILLARY: GLUCOSE-CAPILLARY: 142 mg/dL — AB (ref 65–99)

## 2016-01-18 NOTE — Progress Notes (Signed)
Pt discharged to home.  Discharge instructions explained to pt.  Pt has no questions at the time of discharge.  Pt states he has all belongings.  IV removed.  Pt taken off unit via wheelchair by volunteer services.

## 2016-01-18 NOTE — Progress Notes (Signed)
Inpatient Diabetes Program Recommendations  AACE/ADA: New Consensus Statement on Inpatient Glycemic Control (2015)  Target Ranges:  Prepandial:   less than 140 mg/dL      Peak postprandial:   less than 180 mg/dL (1-2 hours)      Critically ill patients:  140 - 180 mg/dL   Lab Results  Component Value Date   GLUCAP 142 (H) 01/18/2016   HGBA1C 9.7 01/09/2016    Review of Glycemic Control Results for Leonard [name], Leonard [name] (MRN [id number]) as of 01/18/2016 11:20  Ref. Range 01/17/2016 07:59 01/17/2016 12:20 01/17/2016 18:05 01/17/2016 21:35 01/18/2016 07:28  Glucose-Capillary Latest Ref Range: 65 - 99 mg/dL 194 (H) 202 (H) 198 (H) 211 (H) 142 (H)    Diabetes history:Type 2 Outpatient Diabetes medications: Novolog 35 units tid, Lantus 60 units qhs Current orders for Inpatient glycemic control:Lantus 40 units qhs, Novolog 0-9 units tid, Novolog 0-5 units qhs  Inpatient Diabetes Program Recommendations: Post prandial blood sugars remain elevated despite current correction insulin- patient takes 35 units  Novolog every meal at home- Please consider adding Novolog 3 units tid with meals (hold if patient eats less than 50%) .  Gentry Fitz, RN, BA, MHA, CDE Diabetes Coordinator Inpatient Diabetes Program  [phone] (Team Pager) [phone] ([place]) 01/18/2016 11:21 AM

## 2016-01-19 LAB — CULTURE, BLOOD (ROUTINE X 2)
Culture: NO GROWTH
Culture: NO GROWTH

## 2016-01-21 ENCOUNTER — Telehealth: Payer: Self-pay | Admitting: *Deleted

## 2016-01-21 ENCOUNTER — Telehealth (HOSPITAL_COMMUNITY): Payer: Self-pay | Admitting: Surgery

## 2016-01-21 ENCOUNTER — Emergency Department (HOSPITAL_COMMUNITY): Payer: Medicaid Other

## 2016-01-21 ENCOUNTER — Observation Stay (HOSPITAL_COMMUNITY)
Admission: EM | Admit: 2016-01-21 | Discharge: 2016-01-24 | Disposition: A | Discharge status: Home / Self Care | Payer: Medicaid Other | Attending: Family Medicine | Admitting: Family Medicine

## 2016-01-21 ENCOUNTER — Other Ambulatory Visit: Payer: Self-pay

## 2016-01-21 ENCOUNTER — Encounter (HOSPITAL_COMMUNITY): Payer: Self-pay | Admitting: Emergency Medicine

## 2016-01-21 DIAGNOSIS — R531 Weakness: Secondary | ICD-10-CM | POA: Diagnosis present

## 2016-01-21 DIAGNOSIS — Z85828 Personal history of other malignant neoplasm of skin: Secondary | ICD-10-CM | POA: Insufficient documentation

## 2016-01-21 DIAGNOSIS — D649 Anemia, unspecified: Secondary | ICD-10-CM

## 2016-01-21 DIAGNOSIS — R5383 Other fatigue: Secondary | ICD-10-CM | POA: Diagnosis present

## 2016-01-21 DIAGNOSIS — E1122 Type 2 diabetes mellitus with diabetic chronic kidney disease: Secondary | ICD-10-CM | POA: Insufficient documentation

## 2016-01-21 DIAGNOSIS — J449 Chronic obstructive pulmonary disease, unspecified: Secondary | ICD-10-CM | POA: Diagnosis not present

## 2016-01-21 DIAGNOSIS — Z794 Long term (current) use of insulin: Secondary | ICD-10-CM | POA: Diagnosis not present

## 2016-01-21 DIAGNOSIS — Z89421 Acquired absence of other right toe(s): Secondary | ICD-10-CM | POA: Diagnosis not present

## 2016-01-21 DIAGNOSIS — E860 Dehydration: Secondary | ICD-10-CM | POA: Diagnosis present

## 2016-01-21 DIAGNOSIS — D509 Iron deficiency anemia, unspecified: Secondary | ICD-10-CM | POA: Diagnosis not present

## 2016-01-21 DIAGNOSIS — Z87891 Personal history of nicotine dependence: Secondary | ICD-10-CM | POA: Insufficient documentation

## 2016-01-21 DIAGNOSIS — E039 Hypothyroidism, unspecified: Secondary | ICD-10-CM | POA: Diagnosis not present

## 2016-01-21 DIAGNOSIS — Z951 Presence of aortocoronary bypass graft: Secondary | ICD-10-CM | POA: Diagnosis not present

## 2016-01-21 DIAGNOSIS — D472 Monoclonal gammopathy: Secondary | ICD-10-CM | POA: Diagnosis present

## 2016-01-21 DIAGNOSIS — I5032 Chronic diastolic (congestive) heart failure: Secondary | ICD-10-CM | POA: Insufficient documentation

## 2016-01-21 DIAGNOSIS — I10 Essential (primary) hypertension: Secondary | ICD-10-CM | POA: Diagnosis not present

## 2016-01-21 DIAGNOSIS — N183 Chronic kidney disease, stage 3 (moderate): Secondary | ICD-10-CM

## 2016-01-21 DIAGNOSIS — N184 Chronic kidney disease, stage 4 (severe): Secondary | ICD-10-CM | POA: Diagnosis not present

## 2016-01-21 DIAGNOSIS — E11319 Type 2 diabetes mellitus with unspecified diabetic retinopathy without macular edema: Secondary | ICD-10-CM | POA: Diagnosis not present

## 2016-01-21 DIAGNOSIS — D539 Nutritional anemia, unspecified: Secondary | ICD-10-CM | POA: Diagnosis present

## 2016-01-21 DIAGNOSIS — Z7982 Long term (current) use of aspirin: Secondary | ICD-10-CM | POA: Insufficient documentation

## 2016-01-21 DIAGNOSIS — I130 Hypertensive heart and chronic kidney disease with heart failure and stage 1 through stage 4 chronic kidney disease, or unspecified chronic kidney disease: Secondary | ICD-10-CM | POA: Diagnosis not present

## 2016-01-21 DIAGNOSIS — E118 Type 2 diabetes mellitus with unspecified complications: Secondary | ICD-10-CM

## 2016-01-21 DIAGNOSIS — R400 Somnolence: Secondary | ICD-10-CM

## 2016-01-21 DIAGNOSIS — E782 Mixed hyperlipidemia: Secondary | ICD-10-CM | POA: Diagnosis not present

## 2016-01-21 DIAGNOSIS — I2510 Atherosclerotic heart disease of native coronary artery without angina pectoris: Secondary | ICD-10-CM | POA: Insufficient documentation

## 2016-01-21 DIAGNOSIS — G9340 Encephalopathy, unspecified: Secondary | ICD-10-CM | POA: Diagnosis present

## 2016-01-21 HISTORY — DX: Anemia, unspecified: D64.9

## 2016-01-21 LAB — TYPE AND SCREEN
ABO/RH(D): B NEG
ANTIBODY SCREEN: NEGATIVE

## 2016-01-21 LAB — BASIC METABOLIC PANEL
Anion gap: 12 (ref 5–15)
BUN: 50 mg/dL — AB (ref 6–20)
CO2: 19 mmol/L — ABNORMAL LOW (ref 22–32)
CREATININE: 4.35 mg/dL — AB (ref 0.61–1.24)
Calcium: 8.8 mg/dL — ABNORMAL LOW (ref 8.9–10.3)
Chloride: 107 mmol/L (ref 101–111)
GFR calc Af Amer: 15 mL/min — ABNORMAL LOW (ref >60)
GFR, EST NON AFRICAN AMERICAN: 13 mL/min — AB (ref >60)
Glucose, Bld: 123 mg/dL — ABNORMAL HIGH (ref 65–99)
POTASSIUM: 4.9 mmol/L (ref 3.5–5.1)
SODIUM: 138 mmol/L (ref 135–145)

## 2016-01-21 LAB — URINALYSIS, ROUTINE W REFLEX MICROSCOPIC
Bacteria, UA: NONE SEEN
Bilirubin Urine: NEGATIVE
GLUCOSE, UA: NEGATIVE mg/dL
Hgb urine dipstick: NEGATIVE
Ketones, ur: NEGATIVE mg/dL
Leukocytes, UA: NEGATIVE
Nitrite: NEGATIVE
PH: 5 (ref 5.0–8.0)
Protein, ur: 100 mg/dL — AB
Specific Gravity, Urine: 1.009 (ref 1.005–1.030)
Squamous Epithelial / LPF: NONE SEEN

## 2016-01-21 LAB — CBC WITH DIFFERENTIAL/PLATELET
BASOS PCT: 0 %
Basophils Absolute: 0 10*3/uL (ref 0.0–0.1)
Eosinophils Absolute: 0.3 10*3/uL (ref 0.0–0.7)
Eosinophils Relative: 4 %
HCT: 25.1 % — ABNORMAL LOW (ref 39.0–52.0)
Hemoglobin: 7.7 g/dL — ABNORMAL LOW (ref 13.0–17.0)
Lymphocytes Relative: 19 %
Lymphs Abs: 1.2 10*3/uL (ref 0.7–4.0)
MCH: 22.2 pg — AB (ref 26.0–34.0)
MCHC: 30.7 g/dL (ref 30.0–36.0)
MCV: 72.3 fL — ABNORMAL LOW (ref 78.0–100.0)
MONO ABS: 0.3 10*3/uL (ref 0.1–1.0)
Monocytes Relative: 5 %
NEUTROS PCT: 72 %
Neutro Abs: 4.6 10*3/uL (ref 1.7–7.7)
PLATELETS: 170 10*3/uL (ref 150–400)
RBC: 3.47 MIL/uL — ABNORMAL LOW (ref 4.22–5.81)
RDW: 19.2 % — ABNORMAL HIGH (ref 11.5–15.5)
WBC: 6.4 10*3/uL (ref 4.0–10.5)

## 2016-01-21 LAB — I-STAT CG4 LACTIC ACID, ED: Lactic Acid, Venous: 0.89 mmol/L (ref 0.5–1.9)

## 2016-01-21 MED ORDER — SODIUM CHLORIDE 0.9 % IV SOLN
Freq: Once | INTRAVENOUS | Status: AC
  Administered 2016-01-21: 22:00:00 via INTRAVENOUS

## 2016-01-21 NOTE — Telephone Encounter (Addendum)
Transitional Care Post-Discharge Follow-Up Phone Call:   Admit date: 01/13/2016 Discharge date: 01/18/2016  Discharge Disposition: Home  Best patient contact number: [phone] Emergency contact(s): Willaim Mode (sister) [phone] PCP: Juanito Doom  Principal Discharge Diagnosis: UTI, CKD, CHF, HTN  Reason for Chronic Case Management: Co-morbidities as follows:  CD, PVD, COPD, HLD, Anemia, T2DM, hypothyroidism  Post-discharge Communication: (Clearly document all attempts clearly and date contact made)   Call Completed: Yes, with patient   Interpreter Needed: No   Please check all that apply:  ? Patient is caring for self at home.  X Patient has caregiver. If so, name and best contact number: Sister, kohlton gilpatrick, lives in same home and assists with care. Also has nurse aid with [facility] that comes in 3 times per week. ? Patient is knowledgeable of his/her condition(s) and/or treatment. Patient states he was unaware of why he was hospitalized. Discussed UTI and treatment as well as CHF exacerbation ? Family and/or caregiver is knowledgeable of patient's condition(s) and/or treatment.   Medication Reconciliation:  ? Medication list reviewed with patient.  X Patient has all discharge medications: Patient uses [facility] and has meds delivered. States he has all discharge meds, however, [name] (paramedic) usually comes by to fill med box. Has not seen her since hosp d/c. Instructed patient to bring all meds with him tomorrow and pharmacy can assist him with filling his med box. Patient states he will. ? Patient has O2/CPAP ordered? If so, name of company that supplies:  Activities of Daily Living:  ? Independent  X Needs assist (describe) Patient walks with cane and sometimes uses rolling walker. Nurse Aid helps with bathing. ? [employer] [facility] (describe)   Community resources in place for patient:  ? None  X Home Health If so, name of agency: Patient thinks nurse aid is with [facility] ? [facility] If so, name of Care Manager and contact number:   ? Assisted Living  ? Hospice  ? Support Group    Topics discussed:  Home Environment: Patient lives in ground floor apt with sister, Gianny Sabino  Support System: Sister  Home DME: none ordered at discharge. Patient has cane and rolling walker  Transportation: States sister drives him to his appts  Food/Nutrition: Denies food insecurity at this time  Would patient benefit from consult with Social Work? Behavioral Health? Pharm D? Pharm D for help with filling pill caddy  [facility] appointment scheduled for: 01/22/2016 at 1100 with PCP  Identified Barriers: Patient requires help with medication management (filling pill caddy)  Patient Education: Discussed reason for hospital admission  Questions/concerns: Patient states he is feeling "alright." States there is less fluid in his legs. Reports weighing daily in AM after first morning void. States he was 210 lbs this AM. Discussed notifying office if 4 or more lbs gained in 2 day period. States understanding.             Hubbard Hartshorn, RN, BSN

## 2016-01-21 NOTE — H&P (Signed)
[facility] Admission History and Physical Service Pager: [phone]  [name] Medical record number: [id number] Date of birth: Feb 21, 1950 Age: 65 y.o. Gender: male  Primary Care Provider: [name], MD Consultants: none Code Status: full  Chief Complaint: sleepiness   Assessment and Plan: Leonard [name] is a 65 y.o. male presenting with anemia and somnolence. PMH is significant for CKD3, HTN, CAD, diastolic HF, COPD, MGUS, anemia, T2DM  Anemia- patient with chronic anemia both iron deficient and CKD. Baseline hemoglobin appears to be 9. Hemoglobin on admission 7.7. FOBT in ED positive. Patient denies melena or hematochezia. Patient endorses fatigue. No SOB or palpitations.  -place in observation, med-surg, attending Dr. Mingo Amber -vitals per floor -up with assistance -GI consult in morning -continue home protonix -am CBC -repeat iron studies, last value in June -Ask about last colonoscopy - per chart review it appears his last was in 2003 -T sat low on last iron panel - consider IV iron while here and starting PO replacement  Somnolence- unclear etiology. Possibly related to anemia. Had similar symptoms last week which was attributed mostly to UTI. Seems to be improving since last week. Differential also includes hypothyroidism, substance abuse, medication side effect, uremia (baseline 40-50), and undiagnosed OSA. -recheck TSH -ammonia level -UDS -ethanol level -PT/OT consult -Consider weaning off clonidine and doxazosin if not improving.  -Monitor O2 sats while sleeping, consider sleep study as outpatient  AoCKD. Cr 4.35 on admission.  Baseline 2.3-2.6 earlier this year, though 3.6-3.8 over the last month.  - Will need outpatient follow up with nephrology and for AVF placement - Continue diuretics for now given pulmonary edema on CXR with close monitoring of renal function, consider stopping if renal function worsening  or not improving.  HFpEF- Last echo May 2017 EF 51-76%, grade 2 diastolic dysfunction. At home takes Torsemide 40-60mg  daily. CXR in ED with mild to moderate pulmonary edema. No signs of fluid overload on exam. Last BNP elevated to 511.8 on 12/11 -continue Torsemide 40mg  BID -repeat BNP  HTN- takes coreg, clonidine, hydralazine, nifedipine at home. BP 141/64 in ED. -continue home medications -monitor BP -continue to taper down clonidine as this may be causing somnolence  T2DM- takes Lantus 60 units daily and 20 units novolog TID -45 units Lantus, can titrate up as needed -moderate sliding scale -CBG AC/HS  Hypothyroidism- synthroid 31mcg at home, last TSH 3.303 last week -repeat TSH -continue synthroid  FEN/GI: renal diet, carb modified Prophylaxis: lovenox  Disposition: pending clinical improvement  History of Present Illness:  Leonard [name] is a 65 y.o. male presenting with somnolence.   Home health aide came by today to help with his medications and thought he had a fever. His family is concerned because he has been excessively sleepy at home. He stays with his sister. At time of interview he denies any recent illnesses, fevers, chills, chest pain, cough, dysuria. He feels sleepy often. He was recently admitted and states he felt very badly at that time but feels much better now, however he does not feel like his normal self due to being tired. Family is not at bedside to speak to their concerns.   Review Of Systems: Per HPI with the following additions:  Review of Systems  Constitutional: Positive for malaise/fatigue. Negative for chills and fever.  Respiratory: Negative for cough and shortness of breath.   Cardiovascular: Negative for chest pain.  Gastrointestinal: Negative for abdominal pain, blood in stool, constipation, diarrhea, melena,  nausea and vomiting.  Genitourinary: Negative for dysuria.  Neurological: Negative for weakness and headaches.   Psychiatric/Behavioral: Negative for substance abuse.    Patient Active Problem List   Diagnosis Date Noted  . Acute encephalopathy 01/21/2016  . Dehydration 01/21/2016  . Fatigue 01/21/2016  . Somnolence, daytime   . Urinary tract infection without hematuria 01/14/2016  . Hypothermia 01/14/2016  . Diabetic retinopathy ([place]) 11/13/2015  . Hypertensive urgency 11/07/2015  . Essential hypertension, benign 10/23/2015  . CHF (congestive heart failure) ([place]) 08/27/2015  . Shortness of breath 08/27/2015  . Iron deficiency anemia 08/03/2015  . Claudication ([place])   . Type 2 diabetes mellitus with stage 3 chronic kidney disease, with long-term current use of insulin ([place])   . Hematuria 06/27/2015  . Acute renal failure superimposed on chronic kidney disease ([place])   . Knee pain   . Chest pain 06/03/2015  . Skin lesion of face 02/22/2015  . 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 Anemia in chronic kidney disease 01/15/2015  . CKD (chronic kidney disease) stage 4, GFR 15-29 ml/min (HCC) 01/15/2015  . Hypothyroidism 11/22/2014  . Health care maintenance 11/22/2014  . COPD (chronic obstructive pulmonary disease) ([employer]) 02/16/2014  . Chronic diastolic congestive heart failure ([place])   . COPD exacerbation ([place])   . History of tobacco use 08/23/2013  . 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 CAD (coronary artery disease) 04/06/2013  . PVD - hx of Rt SFA PTA and s/p Rt 4-5th toe amp Dec 2014 04/05/2013  . Atherosclerosis of native arteries of the extremities with gangrene ([place]) 02/15/2013  . Essential hypertension 11/25/2012  . Mixed hyperlipidemia 07/17/2008    Past Medical History: Past Medical History:  Diagnosis Date  . Cancer ([place])    skin cancer-  . CHF (congestive heart failure) ([place])   . Coronary artery disease   . Gangrene of toe ([place])    right 5th/notes 01/04/2013   . High cholesterol   . Hypertension   . Iron deficiency anemia 08/03/2015  . MGUS (monoclonal  gammopathy of unknown significance) 01/15/2015  . PAD (peripheral artery disease) ([place])   . Shortness of breath dyspnea    with exertion  . Type II diabetes mellitus ([place])    Type II    Past Surgical History: Past Surgical History:  Procedure Laterality Date  . AMPUTATION Right 01/28/2013   Procedure: RAY AMPUTATION RIGHT  4th & 5th TOE;  Surgeon: Rosetta Posner, MD;  Location: [facility] OR;  Service: Vascular;  Laterality: Right;  . ANGIOPLASTY / STENTING FEMORAL Right 01/13/2013   superficial femoral artery x 2 (3 mm x 60 mm, 4 mm x 60 mm)  . COLONOSCOPY W/ POLYPECTOMY    . CORONARY ARTERY BYPASS GRAFT N/A 04/07/2013   Procedure: CORONARY ARTERY BYPASS GRAFTING (CABG);  Surgeon: Ivin Poot, MD;  Location: [facility];  Service: Open Heart Surgery;  Laterality: N/A;  . INTRAOPERATIVE TRANSESOPHAGEAL ECHOCARDIOGRAM N/A 04/07/2013   Procedure: INTRAOPERATIVE TRANSESOPHAGEAL ECHOCARDIOGRAM;  Surgeon: Ivin Poot, MD;  Location: [place];  Service: Open Heart Surgery;  Laterality: N/A;  . LEFT HEART CATHETERIZATION WITH CORONARY ANGIOGRAM N/A 04/05/2013   Procedure: LEFT HEART CATHETERIZATION WITH CORONARY ANGIOGRAM;  Surgeon: Peter M Martinique, MD;  Location: [facility] CATH LAB;  Service: Cardiovascular;  Laterality: N/A;  . LOWER EXTREMITY ANGIOGRAM Bilateral 01/06/2013   Procedure: LOWER EXTREMITY ANGIOGRAM;  Surgeon: Conrad Okarche, MD;  Location: [name] CATH LAB;  Service: Cardiovascular;  Laterality: Bilateral;  . LOWER  EXTREMITY ANGIOGRAM Right 01/13/2013   Procedure: LOWER EXTREMITY ANGIOGRAM;  Surgeon: Conrad Georgetown, MD;  Location: [facility] CATH LAB;  Service: Cardiovascular;  Laterality: Right;  . SKIN CANCER EXCISION  2017  . THROAT SURGERY  1983   polyps  . TONSILLECTOMY AND ADENOIDECTOMY  ~ 1965    Social History: Social History  Substance Use Topics  . Smoking status: Former Smoker    Packs/day: 1.00    Years: 46.00    Types: Cigarettes    Start date: 02/04/1967    Quit date: 02/03/2013  . Smokeless  tobacco: Never Used     Comment: Doing well with quitting.  . Alcohol use No   Additional social history: lives with sister Please also refer to relevant sections of EMR.  Family History: Family History  Problem Relation Age of Onset  . Cancer Mother     lung cancer  . Hypertension Mother   . Cancer Sister     patient thinks it was uterine cancer  . Hypertension Sister   . Heart attack Sister   . Stroke Neg Hx    Allergies and Medications: No Known Allergies No current facility-administered medications on file prior to encounter.    Current Outpatient Prescriptions on File Prior to Encounter  Medication Sig Dispense Refill  . ADVAIR DISKUS 250-50 MCG/DOSE AEPB INHALE ONE PUFF INTO THE LUNGS TWO TIMESDAILY 60 each 3  . aspirin 81 MG tablet TAKE ONE (1) TABLET BY MOUTH EVERY DAY (Patient taking differently: TAKE 81 MG BY MOUTH EVERY DAY) 30 tablet 3  . atorvastatin (LIPITOR) 40 MG tablet TAKE 1 TABLET BY MOUTH EVERY DAY AT 6 PM (Patient taking differently: Take 40 mg by mouth daily at 6 PM. ) 90 tablet 3  . carvedilol (COREG) 25 MG tablet Take 2 tablets (50 mg total) by mouth 2 (two) times daily with a meal. 120 tablet 3  . cloNIDine (CATAPRES) 0.2 MG tablet Take 1 tablet (0.2 mg total) by mouth 2 (two) times daily. 30 tablet 0  . doxazosin (CARDURA) 8 MG tablet Take 1 tablet (8 mg total) by mouth 2 (two) times daily. 60 tablet 6  . hydrALAZINE (APRESOLINE) 100 MG tablet TAKE ONE TABLET BY MOUTH EVERY EIGHT HOURS (Patient taking differently: Take 100 mg by mouth every 8 (eight) hours. ) 90 tablet 3  . insulin aspart (NOVOLOG FLEXPEN) 100 UNIT/ML FlexPen Inject 20 Units into the skin 3 (three) times daily with meals. (Patient taking differently: Inject 35 Units into the skin 3 (three) times daily with meals. ) 15 mL 3  . Insulin Glargine (LANTUS SOLOSTAR) 100 UNIT/ML Solostar Pen Inject 60 Units into the skin daily.    . isosorbide mononitrate (IMDUR) 60 MG 24 hr tablet TAKE ONE (1)  TABLET BY MOUTH EVERY DAY (Patient taking differently: Take 60 mg by mouth daily. ) 30 tablet 3  . levothyroxine (SYNTHROID, LEVOTHROID) 50 MCG tablet TAKE ONE (1) TABLET BY MOUTH EVERY DAY BEFORE BREAKFAST (Patient taking differently: TAKE 50 MCG BY MOUTH EVERY DAY BEFORE BREAKFAST) 30 tablet 3  . NIFEdipine (PROCARDIA XL/ADALAT-CC) 60 MG 24 hr tablet Take 2 tablets (120 mg total) by mouth daily. 60 tablet 3  . nitroGLYCERIN (NITROSTAT) 0.4 MG SL tablet Place 1 tablet (0.4 mg total) under the tongue every 5 (five) minutes as needed for chest pain. 30 tablet 12  . pantoprazole (PROTONIX) 40 MG tablet TAKE 1 TABLET BY MOUTH DAILY (Patient taking differently: TAKE 40 MG BY MOUTH DAILY) 30 tablet 3  .  potassium chloride SA (K-DUR,KLOR-CON) 20 MEQ tablet Take 85meq (1 tablet ) in the AM and 65meq (1/2 tablet) in the PM (Patient taking differently: Take 10-20 mEq by mouth See admin instructions. Take 3meq (1 tablet ) in the AM and 18meq (1/2 tablet) in the PM) 135 tablet 3  . PROVENTIL HFA 108 (90 Base) MCG/ACT inhaler INHALE 2 PUFFS INTO THE LUNGS EVERY 6 HOURS AS NEEDED FOR WHEEZING ORSHORTNESS OF BREATH 6.7 g 3  . SURE COMFORT PEN NEEDLES 31G X 8 MM MISC USE WITH BOTH INSULIN PENS TWICE DAILY (4 PER DAY) (Patient not taking: Reported on 01/14/2016) 100 each 5  . torsemide (DEMADEX) 20 MG tablet Take 2 tablets (40 mg total) by mouth 2 (two) times daily. (Patient taking differently: Take 40-60 mg by mouth See admin instructions. Take 60 mg by mouth in the morning and take 40 mg by mouth in the evening) 150 tablet 3    Objective: BP 141/64   Pulse 65   Temp 97.9 F (36.6 C) (Oral)   Resp 13   Ht 5\' 8"  (1.727 m)   Wt 215 lb (97.5 kg)   SpO2 92%   BMI 32.69 kg/m  Exam: General: laying in bed, NAD Eyes: EOMI, PERRLA ENTM: moist mucous membranes, poor dentition Neck: supple, non-tender, no lymphadenopathy Cardiovascular: RRR no MRG Respiratory: CTA bilaterally. No increased work of  breathing Gastrointestinal: soft, non-tender, non-distended. +BS MSK: extremities warm, well perfused. No edema or cyanosis Derm: no rashes or lesions noted Neuro: Drowsy. CN2-12 in tact. Alert and oriented x 3. Sensation and strength normal throughout Psych: appropriate mood and affect  Labs and Imaging: CBC BMET   Recent Labs Lab 01/21/16 1745  WBC 6.4  HGB 7.7*  HCT 25.1*  PLT 170    Recent Labs Lab 01/21/16 1745  NA 138  K 4.9  CL 107  CO2 19*  BUN 50*  CREATININE 4.35*  GLUCOSE 123*  CALCIUM 8.8*     Dg Chest 2 View  Result Date: 01/21/2016 CLINICAL DATA:  Initial evaluation for acute dizziness. History of CHF. EXAM: CHEST  2 VIEW COMPARISON:  Prior radiograph from 01/14/2016. FINDINGS: Median sternotomy wires with underlying CABG markers noted. Cardiomegaly is stable. Mediastinal silhouette within normal limits. Lungs are hypoinflated. Diffuse pulmonary vascular congestion with indistinctness of the interstitial markings, consistent with pulmonary edema. Probable small layering bilateral pleural effusions. No definite focal infiltrates. No pneumothorax. No acute osseous abnormality. IMPRESSION: Cardiomegaly with mild to moderate diffuse pulmonary edema, suggesting acute CHF exacerbation. Electronically Signed   By: Jeannine Boga M.D.   On: 01/21/2016 21:24   EKG: NSR. No acute ischemic changes.    Steve Rattler, DO 01/21/2016, 11:32 PM PGY-1, [place] Intern pager: [phone], text pages welcome  UPPER LEVEL ADDENDUM  I have read the above note and made revisions highlighted in blue.  Algis Greenhouse. Jerline Pain, [place] Resident PGY-3 01/22/2016 7:00 AM

## 2016-01-21 NOTE — ED Provider Notes (Signed)
[facility] DEPT Provider Note   CSN: [id number] Arrival date & time: 01/21/16  1629     History   Chief Complaint Chief Complaint  Patient presents with  . Dizziness    HPI Leonard [name] is a 65 y.o. male.  HPI 65 year old gentleman with a history of CHF (last echo 06/05/2015: LVEF 60% to 65%), CAD s/p CABG 2015, stage 4 CKD presenting via EMS with dizziness. Patient denies dizziness and reports being here because his weekly home visiting nurse noted he had a fever. He denies the use of any antipyretics today. Review of systems negative except for a loose stool. He denies dizziness, chest pain, shortness of breath, nausea/vomiting, chills or any other symptoms.  Past Medical History:  Diagnosis Date  . Cancer ([place])    skin cancer-  . CHF (congestive heart failure) ([place])   . Coronary artery disease   . Gangrene of toe ([place])    right 5th/notes 01/04/2013   . High cholesterol   . Hypertension   . Iron deficiency anemia 08/03/2015  . 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 PAD (peripheral artery disease) ([place])   . Shortness of breath dyspnea    with exertion  . Type II diabetes mellitus ([place])    Type II    Patient Active Problem List   Diagnosis Date Noted  . Acute encephalopathy 01/21/2016  . Dehydration 01/21/2016  . Somnolence, daytime   . Urinary tract infection without hematuria 01/14/2016  . Hypothermia 01/14/2016  . Diabetic retinopathy ([place]) 11/13/2015  . Hypertensive urgency 11/07/2015  . Essential hypertension, benign 10/23/2015  . CHF (congestive heart failure) ([place]) 08/27/2015  . Shortness of breath 08/27/2015  . Iron deficiency anemia 08/03/2015  . Claudication ([place])   . Type 2 diabetes mellitus with stage 3 chronic kidney disease, with long-term current use of insulin ([place])   . Hematuria 06/27/2015  . Acute renal failure superimposed on chronic kidney disease ([place])   . Knee pain   . Chest pain 06/03/2015  . Skin  lesion of face 02/22/2015  . 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 Anemia in chronic kidney disease 01/15/2015  . CKD (chronic kidney disease) stage 4, GFR 15-29 ml/min (HCC) 01/15/2015  . Hypothyroidism 11/22/2014  . Health care maintenance 11/22/2014  . COPD (chronic obstructive pulmonary disease) ([place]) 02/16/2014  . Chronic diastolic congestive heart failure ([place])   . COPD exacerbation ([place])   . History of tobacco use 08/23/2013  . 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 CAD (coronary artery disease) 04/06/2013  . PVD - hx of Rt SFA PTA and s/p Rt 4-5th toe amp Dec 2014 04/05/2013  . Atherosclerosis of native arteries of the extremities with gangrene ([place]) 02/15/2013  . Essential hypertension 11/25/2012  . Mixed hyperlipidemia 07/17/2008    Past Surgical History:  Procedure Laterality Date  . AMPUTATION Right 01/28/2013   Procedure: RAY AMPUTATION RIGHT  4th & 5th TOE;  Surgeon: Rosetta Posner, MD;  [facility];  Service: Vascular;  Laterality: Right;  . ANGIOPLASTY / STENTING FEMORAL Right 01/13/2013   superficial femoral artery x 2 (3 mm x 60 mm, 4 mm x 60 mm)  . COLONOSCOPY W/ POLYPECTOMY    . CORONARY ARTERY BYPASS GRAFT N/A 04/07/2013   Procedure: CORONARY ARTERY BYPASS GRAFTING (CABG);  Surgeon: Ivin Poot, MD;  Location: [facility];  Service: Open Heart Surgery;  Laterality: N/A;  . INTRAOPERATIVE TRANSESOPHAGEAL ECHOCARDIOGRAM N/A 04/07/2013   Procedure: INTRAOPERATIVE TRANSESOPHAGEAL  ECHOCARDIOGRAM;  Surgeon: Ivin Poot, MD;  Location: [facility];  Service: Open Heart Surgery;  Laterality: N/A;  . LEFT HEART CATHETERIZATION WITH CORONARY ANGIOGRAM N/A 04/05/2013   Procedure: LEFT HEART CATHETERIZATION WITH CORONARY ANGIOGRAM;  Surgeon: Peter M Martinique, MD;  Location[facility] C[facility] LAB;  Service: Cardiovascular;  Laterality: N/A;  . LOWER EXTREMITY ANGIOGRAM Bilateral 01/06/2013   Procedure: LOWER EXTREMITY ANGIOGRAM;  Surgeon: Conrad Badger, MD;  Location: [facility] CATH LAB;   Service: Cardiovascular;  Laterality: Bilateral;  . LOWER EXTREMITY ANGIOGRAM Right 01/13/2013   Procedure: LOWER EXTREMITY ANGIOGRAM;  Surgeon: Conrad Kiowa, MD;  Location: [facility] CATH LAB;  Service: Cardiovascular;  Laterality: Right;  . SKIN CANCER EXCISION  2017  . THROAT SURGERY  1983   polyps  . TONSILLECTOMY AND ADENOIDECTOMY  ~ 1965       Home Medications    Prior to Admission medications   Medication Sig Start Date End Date Taking? Authorizing Provider  ADVAIR DISKUS 250-50 MCG/DOSE AEPB INHALE ONE PUFF INTO THE LUNGS TWO TIMESDAILY 11/22/15  Yes [name], MD  aspirin 81 MG tablet TAKE ONE (1) TABLET BY MOUTH EVERY DAY Patient taking differently: TAKE 81 MG BY MOUTH EVERY DAY 10/18/15  Yes [name], MD  atorvastatin (LIPITOR) 40 MG tablet TAKE 1 TABLET BY MOUTH EVERY DAY AT 6 PM Patient taking differently: Take 40 mg by mouth daily at 6 PM.  11/07/15   Shirley Friar, PA-C  carvedilol (COREG) 25 MG tablet Take 2 tablets (50 mg total) by mouth 2 (two) times daily with a meal. 10/23/15   Shirley Friar, PA-C  cloNIDine (CATAPRES) 0.2 MG tablet Take 1 tablet (0.2 mg total) by mouth 2 (two) times daily. 01/17/16   Steve Rattler, DO  doxazosin (CARDURA) 8 MG tablet Take 1 tablet (8 mg total) by mouth 2 (two) times daily. 01/07/16   Larey Dresser, MD  hydrALAZINE (APRESOLINE) 100 MG tablet TAKE ONE TABLET BY MOUTH EVERY EIGHT HOURS Patient taking differently: Take 100 mg by mouth every 8 (eight) hours.  10/23/15   Shirley Friar, PA-C  insulin aspart (NOVOLOG FLEXPEN) 100 UNIT/ML FlexPen Inject 20 Units into the skin 3 (three) times daily with meals. Patient taking differently: Inject 35 Units into the skin 3 (three) times daily with meals.  01/09/16   [name], MD  Insulin Glargine (LANTUS SOLOSTAR) 100 UNIT/ML Solostar Pen Inject 60 Units into the skin daily. 09/14/15   Zenia Resides, MD  isosorbide mononitrate (IMDUR) 60 MG 24 hr  tablet TAKE ONE (1) TABLET BY MOUTH EVERY DAY Patient taking differently: Take 60 mg by mouth daily.  08/02/15   Rosemarie Ax, MD  levothyroxine (SYNTHROID, LEVOTHROID) 50 MCG tablet TAKE ONE (1) TABLET BY MOUTH EVERY DAY BEFORE BREAKFAST Patient taking differently: TAKE 50 MCG BY MOUTH EVERY DAY BEFORE BREAKFAST 09/26/15   [name], MD  NIFEdipine (PROCARDIA XL/ADALAT-CC) 60 MG 24 hr tablet Take 2 tablets (120 mg total) by mouth daily. 01/07/16   Larey Dresser, MD  nitroGLYCERIN (NITROSTAT) 0.4 MG SL tablet Place 1 tablet (0.4 mg total) under the tongue every 5 (five) minutes as needed for chest pain. 06/06/15   Mercy Riding, MD  pantoprazole (PROTONIX) 40 MG tablet TAKE 1 TABLET BY MOUTH DAILY Patient taking differently: TAKE 40 MG BY MOUTH DAILY 10/18/15   [name], MD  potassium chloride SA (K-DUR,KLOR-CON) 20 MEQ tablet Take 56meq (1 tablet )  in the AM and 52meq (1/2 tablet) in the PM Patient taking differently: Take 10-20 mEq by mouth See admin instructions. Take 67meq (1 tablet ) in the AM and 53meq (1/2 tablet) in the PM 11/21/15   Satira Mccallum Tillery, PA-C  PROVENTIL HFA 108 (90 Base) MCG/ACT inhaler INHALE 2 PUFFS INTO THE LUNGS EVERY 6 HOURS AS NEEDED FOR WHEEZING ORSHORTNESS OF BREATH 09/25/15   [name], MD  SURE COMFORT PEN NEEDLES 31G X 8 MM MISC USE WITH BOTH INSULIN PENS TWICE DAILY (4 PER DAY) Patient not taking: Reported on 01/14/2016 06/14/15   Olin Hauser, DO  torsemide (DEMADEX) 20 MG tablet Take 2 tablets (40 mg total) by mouth 2 (two) times daily. Patient taking differently: Take 40-60 mg by mouth See admin instructions. Take 60 mg by mouth in the morning and take 40 mg by mouth in the evening 11/26/15   Shirley Friar, PA-C    Family History Family History  Problem Relation Age of Onset  . Cancer Mother     lung cancer  . Hypertension Mother   . Cancer Sister     patient thinks it was uterine cancer  .  Hypertension Sister   . Heart attack Sister   . Stroke Neg Hx     Social History Social History  Substance Use Topics  . Smoking status: Former Smoker    Packs/day: 1.00    Years: 46.00    Types: Cigarettes    Start date: 02/04/1967    Quit date: 02/03/2013  . Smokeless tobacco: Never Used     Comment: Doing well with quitting.  . Alcohol use No     Allergies   Patient has no known allergies.   Review of Systems Review of Systems  Constitutional: Negative for chills, diaphoresis and fever.  HENT: Negative for congestion, ear pain and sore throat.   Eyes: Negative for pain and visual disturbance.  Respiratory: Negative for cough, choking, chest tightness, shortness of breath, wheezing and stridor.   Cardiovascular: Negative for chest pain, palpitations and leg swelling.  Gastrointestinal: Positive for diarrhea. Negative for abdominal pain, blood in stool, nausea and vomiting.       Patient reported a single loose stool. No blood  Genitourinary: Negative for dysuria, flank pain and hematuria.  Musculoskeletal: Negative for arthralgias, back pain, myalgias, neck pain and neck stiffness.  Skin: Negative for color change, pallor, rash and wound.  Neurological: Negative for dizziness, seizures and syncope.       Patient denies dizziness at this time but does report having had some mild dizziness he was not concerned about. He states that he is here because he was told he had a fever.  Psychiatric/Behavioral: Negative for agitation and behavioral problems.  All other systems reviewed and are negative.    Physical Exam Updated Vital Signs BP 135/63   Pulse (!) 58   Temp 97.9 F (36.6 C) (Oral)   Resp 18   Ht 5\' 8"  (1.727 m)   Wt 97.5 kg   SpO2 97%   BMI 32.69 kg/m   Physical Exam  Constitutional: He is oriented to person, place, and time. He appears well-developed and well-nourished. No distress.  Afebrile, nontoxic appearing, lying comfortably in bed. He is somnolent  but alert and oriented.  HENT:  Head: Normocephalic and atraumatic.  Right Ear: External ear normal.  Left Ear: External ear normal.  Nose: Nose normal.  Mouth/Throat: Oropharynx is clear and moist. No oropharyngeal exudate.  Eyes:  Conjunctivae and EOM are normal. Pupils are equal, round, and reactive to light. Right eye exhibits no discharge. Left eye exhibits no discharge. No scleral icterus.  Neck: Normal range of motion. Neck supple. No tracheal deviation present.  Cardiovascular: Normal rate, regular rhythm, normal heart sounds and intact distal pulses.  Exam reveals no gallop.   No murmur heard. Patient with 1+ pitting edema in lower extremities bilaterally  Pulmonary/Chest: Effort normal and breath sounds normal. No stridor. No respiratory distress. He has no wheezes. He has no rales. He exhibits no tenderness.  Abdominal: Soft. Bowel sounds are normal. He exhibits no distension. There is no tenderness. There is no guarding.  Genitourinary:  Genitourinary Comments: No gross blood or abnormality noted on rectal exam  Musculoskeletal: Normal range of motion. He exhibits no edema, tenderness or deformity.  Neurological: He is alert and oriented to person, place, and time. No cranial nerve deficit or sensory deficit. Coordination normal.  Patient has strong grips bilaterally, flexion at the elbow and extension, flexion at the ankle and extension. Sensory motor function intact in both upper and lower extremities bilaterally.  Skin: Skin is warm and dry. Capillary refill takes less than 2 seconds. No rash noted. He is not diaphoretic. No erythema. No pallor.  Patient with sternal surgical scar  Psychiatric: He has a normal mood and affect. His behavior is normal.  Nursing note and vitals reviewed.    ED Treatments / Results  Labs (all labs ordered are listed, but only abnormal results are displayed) Labs Reviewed  CBC WITH DIFFERENTIAL/PLATELET - Abnormal; Notable for the following:         Result Value   RBC 3.47 (*)    Hemoglobin 7.7 (*)    HCT 25.1 (*)    MCV 72.3 (*)    MCH 22.2 (*)    RDW 19.2 (*)    All other components within normal limits  BASIC METABOLIC PANEL - Abnormal; Notable for the following:    CO2 19 (*)    Glucose, Bld 123 (*)    BUN 50 (*)    Creatinine, Ser 4.35 (*)    Calcium 8.8 (*)    GFR calc non Af Amer 13 (*)    GFR calc Af Amer 15 (*)    All other components within normal limits  URINALYSIS, ROUTINE W REFLEX MICROSCOPIC - Abnormal; Notable for the following:    Color, Urine STRAW (*)    Protein, ur 100 (*)    All other components within normal limits  I-STAT CG4 LACTIC ACID, ED  I-STAT CG4 LACTIC ACID, ED  TYPE AND SCREEN    EKG  EKG Interpretation  Date/Time:  Monday January 21 2016 19:56:41 EST Ventricular Rate:  60 PR Interval:    QRS Duration: 101 QT Interval:  451 QTC Calculation: 451 R Axis:   72 Text Interpretation:  Sinus rhythm Probable left ventricular hypertrophy Since last tracing LVH more prominent Abnormal ekg Confirmed by Sabra Heck  MD, BRIAN ([id number]) on 01/21/2016 8:07:55 PM       Radiology Dg Chest 2 View  Result Date: 01/21/2016 CLINICAL DATA:  Initial evaluation for acute dizziness. History of CHF. EXAM: CHEST  2 VIEW COMPARISON:  Prior radiograph from 01/14/2016. FINDINGS: Median sternotomy wires with underlying CABG markers noted. Cardiomegaly is stable. Mediastinal silhouette within normal limits. Lungs are hypoinflated. Diffuse pulmonary vascular congestion with indistinctness of the interstitial markings, consistent with pulmonary edema. Probable small layering bilateral pleural effusions. No definite focal infiltrates. No pneumothorax. No  acute osseous abnormality. IMPRESSION: Cardiomegaly with mild to moderate diffuse pulmonary edema, suggesting acute CHF exacerbation. Electronically Signed   By: Jeannine Boga M.D.   On: 01/21/2016 21:24    Procedures Procedures (including critical care  time)  Medications Ordered in ED Medications  0.9 %  sodium chloride infusion (not administered)     Initial Impression / Assessment and Plan / ED Course  I have reviewed the triage vital signs and the nursing notes.  Pertinent labs & imaging results that were available during my care of the patient were reviewed by me and considered in my medical decision making (see chart for details).  Clinical Course as of Jan 21 2215  Mon Jan 21, 2016  1835 Hemoglobin: (!) 7.7 [JM]  1839 Creatinine: (!) 4.35 [JM]  1839 Creatinine: (!) 4.35 [JM]  1921 Anion gap: 12 [JM]    Clinical Course User Index [JM] Emeline General, PA-C    Patient was recently admitted on 01/13/2016 for 5 days for weakness and hypothermia and was found to have a UTI.  For his recent diarrhea, based on hospital protocol and only a single loose stool, C-diff testing was not indicated.  Hemoccult positive, performed by myself and interpreted by myself and Dr. Sabra Heck U/A: negative for UTI  Cbc: hgb 7.7 from 8.4 and 7.9 four days ago ordered type and screen Bmp: creatinine 4.35 from 3.64 EKG: NSR showing more prominent LVH when compared to previous EKG  When I reassessed the patient, his niece was now in the room and states that he is more "sleepy" and less talkative that he would normally be. His sister [name] was over the phone and explains that he does not have adequate supervision at home and they are concerned that something is not quite right with him. Family is requesting that he has a colonoscopy performed while he is here. Patient has been stable and without complaints while in the Ed. He was resting comfortably in bed and had no complaints.  Patient will be admitted overnight for further evaluation.   Patient was also seen by supervising Attending Dr. Sabra Heck who agrees with assessment and plan and spoke with admitting physician Dr. Roel Cluck regarding this patient.  Final Clinical Impressions(s) / ED Diagnoses    Final diagnoses:  Anemia, unspecified type  Fatigue, unspecified type    New Prescriptions New Prescriptions   No medications on file     Dossie Der 01/21/16 2217    Noemi Chapel, MD 01/23/16 1031

## 2016-01-21 NOTE — ED Triage Notes (Signed)
Pt arrives via [facility] from home for c/o dizziness, pt was recently treated here for sepsis and reports dizziness since. Pt a/ox4, vss, resp e/u, nad.

## 2016-01-21 NOTE — Telephone Encounter (Signed)
I received a call from a neighbor of Mr. Brach requesting the "visiting person from the [facility]".  I explained that she was in search of [name] with the [facility] and that I could contact her for Mr. Trevino if needed.  The neighbor tells me that he is "sleepy and sugars have been high".  She said his last BP was 130/58 and he has taken all of his medications.  I contacted [name] and she plans to go today to assess Mr Eichenberger's condition and report back any concerns.

## 2016-01-21 NOTE — ED Provider Notes (Signed)
The patient is a 65 year old male, recently admitted to the hospital for possible sepsis as he was hypothermic and generally weak, ended up being treated for urinary tract infection. He spent several days in the hospital and left improved, since being home he has done very well but states that he was sent over today because of a possible fever. The patient denies feeling bad at all and specifically denies headache, sore throat, cough, chest pain, back pain, abdominal pain, swelling, rashes, dysuria. He does endorse having some runny diarrhea today. On exam the patient has an extremely soft and nontender abdomen with normal heart rate, normal lung sounds, normal skin tone without any signs of rashes redness or cellulitis. Clear oropharynx, moist mucous membranes, conjunctivae are clear without any jaundice, head is atraumatic, extremities are strong with normal 5 out of 5 strength in all 4 extremities. He follows commands, has clear speech, does not appear to be in any distress whatsoever. Reviewed vital signs, they're normal at this time. We'll obtain some basic labs and a chest x-ray to rule out any source of infection given his recent admission would entertain Clostridium difficile given his diarrhea today but he has no abdominal pain and only had one episode. Will hold on C. difficile testing at this time.  Heme positive - I confirmed this on my testing.  Family has arrived and states that he is more somnolent than usual, more tired than usual, weaker than usual and did have a fever prehospital.  D/w Resident of [facility] service who will admit  Medical screening examination/treatment/procedure(s) were conducted as a shared visit with non-physician practitioner(s) and myself.  I personally evaluated the patient during the encounter.  Clinical Impression:   Final diagnoses:  Anemia, unspecified type  Fatigue, unspecified type       .   Noemi Chapel, MD 01/23/16 1030

## 2016-01-22 ENCOUNTER — Inpatient Hospital Stay: Payer: Medicaid Other | Admitting: Family Medicine

## 2016-01-22 DIAGNOSIS — R400 Somnolence: Secondary | ICD-10-CM | POA: Diagnosis present

## 2016-01-22 DIAGNOSIS — N185 Chronic kidney disease, stage 5: Secondary | ICD-10-CM | POA: Diagnosis not present

## 2016-01-22 LAB — BASIC METABOLIC PANEL
ANION GAP: 12 (ref 5–15)
Anion gap: 10 (ref 5–15)
BUN: 51 mg/dL — ABNORMAL HIGH (ref 6–20)
BUN: 47 mg/dL — AB (ref 6–20)
CHLORIDE: 106 mmol/L (ref 101–111)
CHLORIDE: 108 mmol/L (ref 101–111)
CO2: 19 mmol/L — AB (ref 22–32)
CO2: 21 mmol/L — AB (ref 22–32)
CREATININE: 3.88 mg/dL — AB (ref 0.61–1.24)
Calcium: 8.5 mg/dL — ABNORMAL LOW (ref 8.9–10.3)
Calcium: 8.9 mg/dL (ref 8.9–10.3)
Creatinine, Ser: 4.41 mg/dL — ABNORMAL HIGH (ref 0.61–1.24)
GFR calc Af Amer: 17 mL/min — ABNORMAL LOW (ref >60)
GFR calc non Af Amer: 13 mL/min — ABNORMAL LOW (ref >60)
GFR calc non Af Amer: 15 mL/min — ABNORMAL LOW (ref >60)
GFR, EST AFRICAN AMERICAN: 15 mL/min — AB (ref >60)
GLUCOSE: 213 mg/dL — AB (ref 65–99)
Glucose, Bld: 164 mg/dL — ABNORMAL HIGH (ref 65–99)
POTASSIUM: 4.7 mmol/L (ref 3.5–5.1)
Potassium: 4.2 mmol/L (ref 3.5–5.1)
SODIUM: 139 mmol/L (ref 135–145)
Sodium: 137 mmol/L (ref 135–145)

## 2016-01-22 LAB — BLOOD GAS, ARTERIAL
ACID-BASE DEFICIT: 4.3 mmol/L — AB (ref 0.0–2.0)
BICARBONATE: 19.5 mmol/L — AB (ref 20.0–28.0)
Drawn by: 10552
FIO2: 21
O2 Saturation: 89.7 %
PCO2 ART: 30.8 mmHg — AB (ref 32.0–48.0)
PH ART: 7.417 (ref 7.350–7.450)
PO2 ART: 62.6 mmHg — AB (ref 83.0–108.0)
Patient temperature: 98.6

## 2016-01-22 LAB — FERRITIN: FERRITIN: 80 ng/mL (ref 24–336)

## 2016-01-22 LAB — IRON AND TIBC
IRON: 18 ug/dL — AB (ref 45–182)
SATURATION RATIOS: 5 % — AB (ref 17.9–39.5)
TIBC: 386 ug/dL (ref 250–450)
UIBC: 368 ug/dL

## 2016-01-22 LAB — GLUCOSE, CAPILLARY
GLUCOSE-CAPILLARY: 163 mg/dL — AB (ref 65–99)
GLUCOSE-CAPILLARY: 197 mg/dL — AB (ref 65–99)
Glucose-Capillary: 184 mg/dL — ABNORMAL HIGH (ref 65–99)
Glucose-Capillary: 209 mg/dL — ABNORMAL HIGH (ref 65–99)
Glucose-Capillary: 143 mg/dL — ABNORMAL HIGH (ref 65–99)

## 2016-01-22 LAB — RAPID URINE DRUG SCREEN, HOSP PERFORMED
Amphetamines: NOT DETECTED
BENZODIAZEPINES: NOT DETECTED
Barbiturates: NOT DETECTED
Cocaine: NOT DETECTED
Opiates: NOT DETECTED
Tetrahydrocannabinol: NOT DETECTED

## 2016-01-22 LAB — CBC
HCT: 25.4 % — ABNORMAL LOW (ref 39.0–52.0)
HEMOGLOBIN: 7.8 g/dL — AB (ref 13.0–17.0)
MCH: 22.5 pg — AB (ref 26.0–34.0)
MCHC: 30.7 g/dL (ref 30.0–36.0)
MCV: 73.4 fL — AB (ref 78.0–100.0)
Platelets: 187 10*3/uL (ref 150–400)
RBC: 3.46 MIL/uL — ABNORMAL LOW (ref 4.22–5.81)
RDW: 19.3 % — ABNORMAL HIGH (ref 11.5–15.5)
WBC: 5.5 10*3/uL (ref 4.0–10.5)

## 2016-01-22 LAB — BRAIN NATRIURETIC PEPTIDE: B NATRIURETIC PEPTIDE 5: 348.7 pg/mL — AB (ref 0.0–100.0)

## 2016-01-22 LAB — TSH: TSH: 4.808 u[IU]/mL — ABNORMAL HIGH (ref 0.350–4.500)

## 2016-01-22 LAB — ETHANOL

## 2016-01-22 LAB — AMMONIA: Ammonia: 21 umol/L (ref 9–35)

## 2016-01-22 MED ORDER — INSULIN GLARGINE 100 UNIT/ML ~~LOC~~ SOLN
45.0000 [IU] | Freq: Every day | SUBCUTANEOUS | Status: DC
  Administered 2016-01-22 – 2016-01-24 (×3): 45 [IU] via SUBCUTANEOUS
  Filled 2016-01-22 (×7): qty 0.45

## 2016-01-22 MED ORDER — NIFEDIPINE ER 60 MG PO TB24
120.0000 mg | ORAL_TABLET | Freq: Every day | ORAL | Status: DC
  Administered 2016-01-22 – 2016-01-24 (×3): 120 mg via ORAL
  Filled 2016-01-22 (×3): qty 2

## 2016-01-22 MED ORDER — PANTOPRAZOLE SODIUM 40 MG PO TBEC
40.0000 mg | DELAYED_RELEASE_TABLET | Freq: Every day | ORAL | Status: DC
  Administered 2016-01-22 – 2016-01-24 (×3): 40 mg via ORAL
  Filled 2016-01-22 (×3): qty 1

## 2016-01-22 MED ORDER — ASPIRIN 81 MG PO CHEW
81.0000 mg | CHEWABLE_TABLET | Freq: Every day | ORAL | Status: DC
Start: 2016-01-22 — End: 2016-01-24
  Administered 2016-01-22 – 2016-01-24 (×3): 81 mg via ORAL
  Filled 2016-01-22 (×3): qty 1

## 2016-01-22 MED ORDER — LEVOTHYROXINE SODIUM 50 MCG PO TABS
50.0000 ug | ORAL_TABLET | Freq: Every day | ORAL | Status: DC
  Administered 2016-01-22 – 2016-01-24 (×3): 50 ug via ORAL
  Filled 2016-01-22 (×3): qty 1

## 2016-01-22 MED ORDER — ALBUTEROL SULFATE (2.5 MG/3ML) 0.083% IN NEBU: 3.0000 mL | INHALATION_SOLUTION | Freq: Four times a day (QID) | RESPIRATORY_TRACT | Status: DC | PRN

## 2016-01-22 MED ORDER — ISOSORBIDE MONONITRATE ER 30 MG PO TB24
60.0000 mg | ORAL_TABLET | Freq: Every day | ORAL | Status: DC
  Administered 2016-01-22 – 2016-01-24 (×3): 60 mg via ORAL
  Filled 2016-01-22 (×3): qty 2

## 2016-01-22 MED ORDER — DOXAZOSIN MESYLATE 8 MG PO TABS
8.0000 mg | ORAL_TABLET | Freq: Two times a day (BID) | ORAL | Status: DC
  Administered 2016-01-22 – 2016-01-24 (×6): 8 mg via ORAL
  Filled 2016-01-22 (×6): qty 1

## 2016-01-22 MED ORDER — SODIUM CHLORIDE 0.9 % IV SOLN
510.0000 mg | Freq: Once | INTRAVENOUS | Status: AC
  Administered 2016-01-22: 510 mg via INTRAVENOUS
  Filled 2016-01-22 (×2): qty 17

## 2016-01-22 MED ORDER — ACETAMINOPHEN 650 MG RE SUPP: 650.0000 mg | Freq: Four times a day (QID) | RECTAL | Status: DC | PRN

## 2016-01-22 MED ORDER — TORSEMIDE 20 MG PO TABS
40.0000 mg | ORAL_TABLET | Freq: Two times a day (BID) | ORAL | Status: DC
  Administered 2016-01-22 – 2016-01-24 (×5): 40 mg via ORAL
  Filled 2016-01-22 (×5): qty 2

## 2016-01-22 MED ORDER — POLYETHYLENE GLYCOL 3350 17 G PO PACK: 17.0000 g | PACK | Freq: Every day | ORAL | Status: DC | PRN

## 2016-01-22 MED ORDER — ACETAMINOPHEN 325 MG PO TABS: 650.0000 mg | ORAL_TABLET | Freq: Four times a day (QID) | ORAL | Status: DC | PRN

## 2016-01-22 MED ORDER — CLONIDINE HCL 0.2 MG PO TABS
0.2000 mg | ORAL_TABLET | Freq: Two times a day (BID) | ORAL | Status: DC
  Administered 2016-01-22 – 2016-01-24 (×6): 0.2 mg via ORAL
  Filled 2016-01-22 (×6): qty 1

## 2016-01-22 MED ORDER — ENOXAPARIN SODIUM 30 MG/0.3ML ~~LOC~~ SOLN
30.0000 mg | SUBCUTANEOUS | Status: DC
  Administered 2016-01-22: 30 mg via SUBCUTANEOUS
  Filled 2016-01-22: qty 0.3

## 2016-01-22 MED ORDER — INSULIN ASPART 100 UNIT/ML ~~LOC~~ SOLN
0.0000 [IU] | Freq: Three times a day (TID) | SUBCUTANEOUS | Status: DC
  Administered 2016-01-22: 3 [IU] via SUBCUTANEOUS
  Administered 2016-01-22: 5 [IU] via SUBCUTANEOUS
  Administered 2016-01-22 – 2016-01-23 (×2): 3 [IU] via SUBCUTANEOUS
  Administered 2016-01-23: 2 [IU] via SUBCUTANEOUS
  Administered 2016-01-23 – 2016-01-24 (×2): 3 [IU] via SUBCUTANEOUS

## 2016-01-22 MED ORDER — ATORVASTATIN CALCIUM 40 MG PO TABS
40.0000 mg | ORAL_TABLET | Freq: Every day | ORAL | Status: DC
  Administered 2016-01-22 – 2016-01-23 (×2): 40 mg via ORAL
  Filled 2016-01-22 (×2): qty 1

## 2016-01-22 MED ORDER — SODIUM CHLORIDE 0.9 % IV SOLN
250.0000 mg | Freq: Every day | INTRAVENOUS | Status: DC
  Filled 2016-01-22 (×2): qty 20

## 2016-01-22 MED ORDER — HYDRALAZINE HCL 50 MG PO TABS
100.0000 mg | ORAL_TABLET | Freq: Three times a day (TID) | ORAL | Status: DC
Start: 2016-01-22 — End: 2016-01-24
  Administered 2016-01-22 – 2016-01-24 (×8): 100 mg via ORAL
  Filled 2016-01-22 (×8): qty 2

## 2016-01-22 MED ORDER — NITROGLYCERIN 0.4 MG SL SUBL: 0.4000 mg | SUBLINGUAL_TABLET | SUBLINGUAL | Status: DC | PRN

## 2016-01-22 MED ORDER — CARVEDILOL 25 MG PO TABS
50.0000 mg | ORAL_TABLET | Freq: Two times a day (BID) | ORAL | Status: DC
  Administered 2016-01-22 – 2016-01-24 (×5): 50 mg via ORAL
  Filled 2016-01-22 (×5): qty 2

## 2016-01-22 MED ORDER — MOMETASONE FURO-FORMOTEROL FUM 200-5 MCG/ACT IN AERO
2.0000 | INHALATION_SPRAY | Freq: Two times a day (BID) | RESPIRATORY_TRACT | Status: DC
  Administered 2016-01-22 – 2016-01-24 (×5): 2 via RESPIRATORY_TRACT
  Filled 2016-01-22: qty 8.8

## 2016-01-22 NOTE — Progress Notes (Signed)
[facility] ROUNDING NOTE   Subjective:   Interval History:  Patient with no complaints sitting curled up in chair   Objective:  Vital signs in last 24 hours:  Temp:  [97.3 F (36.3 C)-98.7 F (37.1 C)] 98.7 F (37.1 C) (12/19 1200) Pulse Rate:  [50-80] 80 (12/19 1200) Resp:  [12-20] 20 (12/19 1200) BP: (124-149)/(55-69) 145/61 (12/19 1200) SpO2:  [91 %-98 %] 94 % (12/19 1200) Weight:  [95.5 kg (210 lb 9.6 oz)-97.5 kg (215 lb)] 95.5 kg (210 lb 9.6 oz) (12/19 0045)  Weight change:  Filed Weights   01/21/16 1634 01/22/16 0045  Weight: 97.5 kg (215 lb) 95.5 kg (210 lb 9.6 oz)    Intake/Output: I/O last 3 completed shifts: In: -  Out: 1250 [Urine:1250]   Intake/Output this shift:  Total I/O In: 120 [P.O.:120] Out: 650 [Urine:650]  CVS- RRR RS- CTA ABD- BS present soft non-distended EXT- no edema   Basic Metabolic Panel:  Recent Labs Lab 01/16/16 0448 01/17/16 0354 01/21/16 1745 01/22/16 0319  NA 141 140 138 137  K 3.8 4.1 4.9 4.2  CL 109 107 107 106  CO2 20* 21* 19* 19*  GLUCOSE 168* 169* 123* 164*  BUN 61* 52* 50* 51*  CREATININE 3.73* 3.64* 4.35* 4.41*  CALCIUM 8.6* 8.3* 8.8* 8.5*    Liver Function Tests: No results for input(s): AST, ALT, ALKPHOS, BILITOT, PROT, ALBUMIN in the last 168 hours. No results for input(s): LIPASE, AMYLASE in the last 168 hours.  Recent Labs Lab 01/22/16 0054  AMMONIA 21    CBC:  Recent Labs Lab 01/16/16 0448 01/17/16 0354 01/17/16 1033 01/21/16 1745 01/22/16 0319  WBC 6.1 5.3  --  6.4 5.5  NEUTROABS  --   --   --  4.6  --   HGB 8.4* 7.9* 8.4* 7.7* 7.8*  HCT 27.8* 26.0* 28.0* 25.1* 25.4*  MCV 72.8* 72.0*  --  72.3* 73.4*  PLT 188 173  --  170 187    Cardiac Enzymes: No results for input(s): CKTOTAL, CKMB, CKMBINDEX, TROPONINI in the last 168 hours.  BNP: Invalid input(s): POCBNP  CBG:  Recent Labs Lab 01/17/16 2135 01/18/16 0728 01/22/16 0103 01/22/16 0620 01/22/16 1112   GLUCAP 211* 142* 163* 184* 197*    Microbiology: Results for orders placed or performed during the hospital encounter of 01/13/16  Blood Culture (routine x 2)     Status: None   Collection Time: 01/13/16 11:25 PM  Result Value Ref Range Status   Specimen Description BLOOD RIGHT ARM  Final   Special Requests BOTTLES DRAWN AEROBIC AND ANAEROBIC 5CC  Final   Culture NO GROWTH 5 DAYS  Final   Report Status 01/19/2016 FINAL  Final  Blood Culture (routine x 2)     Status: None   Collection Time: 01/13/16 11:31 PM  Result Value Ref Range Status   Specimen Description BLOOD RIGHT ARM  Final   Special Requests BOTTLES DRAWN AEROBIC AND ANAEROBIC 5CC  Final   Culture NO GROWTH 5 DAYS  Final   Report Status 01/19/2016 FINAL  Final  Urine culture     Status: Abnormal   Collection Time: 01/14/16  1:22 AM  Result Value Ref Range Status   Specimen Description URINE, RANDOM  Final   Special Requests NONE  Final   Culture >=100,000 COLONIES/mL ESCHERICHIA COLI (A)  Final   Report Status 01/17/2016 FINAL  Final   Organism ID, Bacteria ESCHERICHIA COLI (A)  Final  Susceptibility   Escherichia coli - MIC*    AMPICILLIN >=32 RESISTANT Resistant     CEFAZOLIN >=64 RESISTANT Resistant     CEFTRIAXONE <=1 SENSITIVE Sensitive     CIPROFLOXACIN 0.5 SENSITIVE Sensitive     GENTAMICIN >=16 RESISTANT Resistant     IMIPENEM <=0.25 SENSITIVE Sensitive     NITROFURANTOIN <=16 SENSITIVE Sensitive     TRIMETH/SULFA <=20 SENSITIVE Sensitive     AMPICILLIN/SULBACTAM >=32 RESISTANT Resistant     PIP/TAZO 8 SENSITIVE Sensitive     Extended ESBL NEGATIVE Sensitive     * >=100,000 COLONIES/mL ESCHERICHIA COLI  MRSA PCR Screening     Status: None   Collection Time: 01/14/16  3:25 AM  Result Value Ref Range Status   MRSA by PCR NEGATIVE NEGATIVE Final    Comment:        The GeneXpert MRSA Assay (FDA approved for NASAL specimens only), is one component of a comprehensive MRSA colonization surveillance  program. It is not intended to diagnose MRSA infection nor to guide or monitor treatment for MRSA infections.     Coagulation Studies: No results for input(s): LABPROT, INR in the last 72 hours.  Urinalysis:  Recent Labs  01/21/16 1951  COLORURINE STRAW*  LABSPEC 1.009  PHURINE 5.0  GLUCOSEU NEGATIVE  HGBUR NEGATIVE  BILIRUBINUR NEGATIVE  KETONESUR NEGATIVE  PROTEINUR 100*  NITRITE NEGATIVE  LEUKOCYTESUR NEGATIVE      Imaging: Dg Chest 2 View  Result Date: 01/21/2016 CLINICAL DATA:  Initial evaluation for acute dizziness. History of CHF. EXAM: CHEST  2 VIEW COMPARISON:  Prior radiograph from 01/14/2016. FINDINGS: Median sternotomy wires with underlying CABG markers noted. Cardiomegaly is stable. Mediastinal silhouette within normal limits. Lungs are hypoinflated. Diffuse pulmonary vascular congestion with indistinctness of the interstitial markings, consistent with pulmonary edema. Probable small layering bilateral pleural effusions. No definite focal infiltrates. No pneumothorax. No acute osseous abnormality. IMPRESSION: Cardiomegaly with mild to moderate diffuse pulmonary edema, suggesting acute CHF exacerbation. Electronically Signed   By: Jeannine Boga M.D.   On: 01/21/2016 21:24     Medications:    . aspirin  81 mg Oral Daily  . atorvastatin  40 mg Oral q1800  . carvedilol  50 mg Oral BID WC  . cloNIDine  0.2 mg Oral BID  . doxazosin  8 mg Oral BID  . enoxaparin (LOVENOX) injection  30 mg Subcutaneous Q24H  . hydrALAZINE  100 mg Oral Q8H  . insulin aspart  0-15 Units Subcutaneous TID WC  . insulin glargine  45 Units Subcutaneous Daily  . isosorbide mononitrate  60 mg Oral Daily  . levothyroxine  50 mcg Oral QAC breakfast  . mometasone-formoterol  2 puff Inhalation BID  . NIFEdipine  120 mg Oral Daily  . pantoprazole  40 mg Oral Daily  . torsemide  40 mg Oral BID   acetaminophen **OR** acetaminophen, albuterol, nitroGLYCERIN, polyethylene  glycol  Assessment/ Plan:  65 year old gentleman with a history of CHF (last echo 06/05/2015: LVEF 60% to 65%), CAD s/p CABG 2015, stage 4 CKD presenting via EMS with dizziness. Patient denies dizziness and reports being here because his weekly home visiting nurse noted he had a fever. He denies the use of any antipyretics today. Review of systems negative except for a loose stool. He denies dizziness, chest pain, shortness of breath, nausea/vomiting, chills or any other symptoms.  Stage 4/5  Chronic renal disease was evaluated 12/13 by Dr Joelyn Oms   He has been reluctant to have an AVF placed  at the last evaluation and follows with Dr Marval Regal as an outpatient  His Anemia is treated by Dr Alvy Bimler   He is iron deficient and will need IV iron   His BP is controlled as is his volume status    We shall be happy to continue to follow    LOS: 0 Leonard [name] W @TODAY @3 :15 PM

## 2016-01-22 NOTE — Consult Note (Signed)
Referring Provider: Dr. Veneda Melter Primary Care Physician:  [name], MD Primary Gastroenterologist:  Dr. Michail Sermon   Reason for Consultation:  Anemia, heme positive stool  HPI: Leonard [name] is a 65 y.o. male past medical history of diastolic heart failure, coronary artery disease, chronic kidney disease, COPD, MGUS was brought into the hospital for possible fever. Upon initial evaluation, patient was found to have hemoglobin of 7.7. FOBT was positive in the ER. GI is consulted for further evaluation.  Patient seen and examined. Resting comfortably in the recliner. Patient was complaining of diarrhea since Thursday which is resolved now. Had a normal bowel movement this morning. Patient denied any abdominal pain, nausea, vomiting. Denied any black stool or blood in the stool. Denied any dysphagia or odynophagia.   Old records reviewed. hemoglobin has been in the range of 8 to 9 since June 2017. Microcytic. Iron study shows normal ferritin , low iron, normal TIBC and low iron saturation.  Patient was seen by GI as inpatient in May 2017 for similar issues. At that time patient was having CHF exacerbation. Patient was subsequently seen in the office by Dr. Michail Sermon in August 2017. EGD and colonoscopy was scheduled for iron deficiency anemia but patient canceled the procedure.   Past Medical History:  Diagnosis Date  . Cancer ([place])    skin cancer-  . CHF (congestive heart failure) ([place])   . Coronary artery disease   . Gangrene of toe ([place])    right 5th/notes 01/04/2013   . High cholesterol   . Hypertension   . Iron deficiency anemia 08/03/2015  . 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 PAD (peripheral artery disease) ([place])   . Shortness of breath dyspnea    with exertion  . Type II diabetes mellitus ([place])    Type II    Past Surgical History:  Procedure Laterality Date  . AMPUTATION Right 01/28/2013   Procedure: RAY AMPUTATION RIGHT  4th & 5th TOE;   Surgeon: Rosetta Posner, MD;  Location: [facility] OR;  Service: Vascular;  Laterality: Right;  . ANGIOPLASTY / STENTING FEMORAL Right 01/13/2013   superficial femoral artery x 2 (3 mm x 60 mm, 4 mm x 60 mm)  . COLONOSCOPY W/ POLYPECTOMY    . CORONARY ARTERY BYPASS GRAFT N/A 04/07/2013   Procedure: CORONARY ARTERY BYPASS GRAFTING (CABG);  Surgeon: Ivin Poot, MD;  Location: [place];  Service: Open Heart Surgery;  Laterality: N/A;  . INTRAOPERATIVE TRANSESOPHAGEAL ECHOCARDIOGRAM N/A 04/07/2013   Procedure: INTRAOPERATIVE TRANSESOPHAGEAL ECHOCARDIOGRAM;  Surgeon: Ivin Poot, MD;  Location: [place];  Service: Open Heart Surgery;  Laterality: N/A;  . LEFT HEART CATHETERIZATION WITH CORONARY ANGIOGRAM N/A 04/05/2013   Procedure: LEFT HEART CATHETERIZATION WITH CORONARY ANGIOGRAM;  Surgeon: Peter M Martinique, MD;  Location: [facility] CATH LAB;  Service: Cardiovascular;  Laterality: N/A;  . LOWER EXTREMITY ANGIOGRAM Bilateral 01/06/2013   Procedure: LOWER EXTREMITY ANGIOGRAM;  Surgeon: Conrad [place], MD;  Location: [facility] CATH LAB;  Service: Cardiovascular;  Laterality: Bilateral;  . LOWER EXTREMITY ANGIOGRAM Right 01/13/2013   Procedure: LOWER EXTREMITY ANGIOGRAM;  Surgeon: Conrad La [place], MD;  Location: [facility] CATH LAB;  Service: Cardiovascular;  Laterality: Right;  . SKIN CANCER EXCISION  2017  . THROAT SURGERY  1983   polyps  . TONSILLECTOMY AND ADENOIDECTOMY  ~ 1965    Prior to Admission medications   Medication Sig Start Date End Date Taking? Authorizing Provider  ADVAIR DISKUS 250-50 MCG/DOSE AEPB INHALE ONE PUFF  INTO THE LUNGS TWO TIMESDAILY 11/22/15  Yes [name], MD  aspirin 81 MG tablet TAKE ONE (1) TABLET BY MOUTH EVERY DAY Patient taking differently: TAKE 81 MG BY MOUTH EVERY DAY 10/18/15  Yes [name], MD  atorvastatin (LIPITOR) 40 MG tablet TAKE 1 TABLET BY MOUTH EVERY DAY AT 6 PM Patient taking differently: Take 40 mg by mouth daily at 6 PM.  11/07/15   Shirley Friar, PA-C   carvedilol (COREG) 25 MG tablet Take 2 tablets (50 mg total) by mouth 2 (two) times daily with a meal. 10/23/15   Shirley Friar, PA-C  cloNIDine (CATAPRES) 0.2 MG tablet Take 1 tablet (0.2 mg total) by mouth 2 (two) times daily. 01/17/16   Steve Rattler, DO  doxazosin (CARDURA) 8 MG tablet Take 1 tablet (8 mg total) by mouth 2 (two) times daily. 01/07/16   Larey Dresser, MD  hydrALAZINE (APRESOLINE) 100 MG tablet TAKE ONE TABLET BY MOUTH EVERY EIGHT HOURS Patient taking differently: Take 100 mg by mouth every 8 (eight) hours.  10/23/15   Shirley Friar, PA-C  insulin aspart (NOVOLOG FLEXPEN) 100 UNIT/ML FlexPen Inject 20 Units into the skin 3 (three) times daily with meals. Patient taking differently: Inject 35 Units into the skin 3 (three) times daily with meals.  01/09/16   [name], MD  Insulin Glargine (LANTUS SOLOSTAR) 100 UNIT/ML Solostar Pen Inject 60 Units into the skin daily. 09/14/15   Zenia Resides, MD  isosorbide mononitrate (IMDUR) 60 MG 24 hr tablet TAKE ONE (1) TABLET BY MOUTH EVERY DAY Patient taking differently: Take 60 mg by mouth daily.  08/02/15   Rosemarie Ax, MD  levothyroxine (SYNTHROID, LEVOTHROID) 50 MCG tablet TAKE ONE (1) TABLET BY MOUTH EVERY DAY BEFORE BREAKFAST Patient taking differently: TAKE 50 MCG BY MOUTH EVERY DAY BEFORE BREAKFAST 09/26/15   [name], MD  NIFEdipine (PROCARDIA XL/ADALAT-CC) 60 MG 24 hr tablet Take 2 tablets (120 mg total) by mouth daily. 01/07/16   Larey Dresser, MD  nitroGLYCERIN (NITROSTAT) 0.4 MG SL tablet Place 1 tablet (0.4 mg total) under the tongue every 5 (five) minutes as needed for chest pain. 06/06/15   Mercy Riding, MD  pantoprazole (PROTONIX) 40 MG tablet TAKE 1 TABLET BY MOUTH DAILY Patient taking differently: TAKE 40 MG BY MOUTH DAILY 10/18/15   [name], MD  potassium chloride SA (K-DUR,KLOR-CON) 20 MEQ tablet Take 44meq (1 tablet ) in the AM and 93meq (1/2 tablet) in the  PM Patient taking differently: Take 10-20 mEq by mouth See admin instructions. Take 41meq (1 tablet ) in the AM and 41meq (1/2 tablet) in the PM 11/21/15   Satira Mccallum Tillery, PA-C  PROVENTIL HFA 108 (90 Base) MCG/ACT inhaler INHALE 2 PUFFS INTO THE LUNGS EVERY 6 HOURS AS NEEDED FOR WHEEZING ORSHORTNESS OF BREATH 09/25/15   [name], MD  SURE COMFORT PEN NEEDLES 31G X 8 MM MISC USE WITH BOTH INSULIN PENS TWICE DAILY (4 PER DAY) Patient not taking: Reported on 01/14/2016 06/14/15   Olin Hauser, DO  torsemide (DEMADEX) 20 MG tablet Take 2 tablets (40 mg total) by mouth 2 (two) times daily. Patient taking differently: Take 40-60 mg by mouth See admin instructions. Take 60 mg by mouth in the morning and take 40 mg by mouth in the evening 11/26/15   Shirley Friar, PA-C    Scheduled Meds: . aspirin  81 mg Oral Daily  .  atorvastatin  40 mg Oral q1800  . carvedilol  50 mg Oral BID WC  . cloNIDine  0.2 mg Oral BID  . doxazosin  8 mg Oral BID  . enoxaparin (LOVENOX) injection  30 mg Subcutaneous Q24H  . ferumoxytol  510 mg Intravenous Once  . hydrALAZINE  100 mg Oral Q8H  . insulin aspart  0-15 Units Subcutaneous TID WC  . insulin glargine  45 Units Subcutaneous Daily  . isosorbide mononitrate  60 mg Oral Daily  . levothyroxine  50 mcg Oral QAC breakfast  . mometasone-formoterol  2 puff Inhalation BID  . NIFEdipine  120 mg Oral Daily  . pantoprazole  40 mg Oral Daily  . torsemide  40 mg Oral BID   Continuous Infusions: PRN Meds:.acetaminophen **OR** acetaminophen, albuterol, nitroGLYCERIN, polyethylene glycol  Allergies as of 01/21/2016  . (No Known Allergies)    Family History  Problem Relation Age of Onset  . Cancer Mother     lung cancer  . Hypertension Mother   . Cancer Sister     patient thinks it was uterine cancer  . Hypertension Sister   . Heart attack Sister   . Stroke Neg Hx     Social History   Social History  . Marital status:  Single    Spouse name: N/A  . Number of children: N/A  . Years of education: N/A   Occupational History  . Not on file.   Social History Main Topics  . Smoking status: Former Smoker    Packs/day: 1.00    Years: 46.00    Types: Cigarettes    Start date: 02/04/1967    Quit date: 02/03/2013  . Smokeless tobacco: Never Used     Comment: Doing well with quitting.  . Alcohol use No  . Drug use: No  . Sexual activity: Not Currently   Other Topics Concern  . Not on file   Social History Narrative   Lives with sister, [name] in [place].  Retired - former Sports coach.    Review of Systems:   Review of Systems  Constitutional: Positive for malaise/fatigue. Negative for weight loss.  HENT: Negative for ear discharge, ear pain, hearing loss, nosebleeds and sore throat.   Eyes: Negative for blurred vision and double vision.  Respiratory: Negative for hemoptysis and wheezing.   Cardiovascular: Negative for chest pain, palpitations, orthopnea and PND.  Gastrointestinal: Positive for diarrhea. Negative for abdominal pain, blood in stool, melena and vomiting.  Genitourinary: Negative for flank pain and hematuria.  Musculoskeletal: Positive for back pain.  Skin: Negative for itching.  Neurological: Negative for tremors, speech change and seizures.  Endo/Heme/Allergies: Does not bruise/bleed easily.  Psychiatric/Behavioral: Negative for hallucinations and suicidal ideas.    Physical Exam: Vital signs: Vitals:   01/22/16 1042 01/22/16 1200  BP: (!) 149/55 (!) 145/61  Pulse:  80  Resp:  20  Temp:  98.7 F (37.1 C)   Last BM Date: 01/22/16 General:   Alert,  Well-developed, well-nourished, pleasant and cooperative in NAD HEENT-normocephalic, atraumatic,ocular movement intact Sclerae. No icterus. Oral mucosa moist. No visible ulcers in the oral cavity Lungs:  Clear throughout to auscultation.   No wheezes, crackles, or rhonchi. No acute distress. Heart:  Regular rate and  rhythm; no murmurs, clicks, rubs,  or gallops. Abdomen: Soft, nontender, nondistended, bowel sounds present. No mass felt Lower extremity. No edema Neurological. Normal Strength in  lower extremity. Alert oriented 3   GI:  Lab Results:  Recent Labs  01/21/16 1745 01/22/16 0319  WBC 6.4 5.5  HGB 7.7* 7.8*  HCT 25.1* 25.4*  PLT 170 187   BMET  Recent Labs  01/21/16 1745 01/22/16 0319  NA 138 137  K 4.9 4.2  CL 107 106  CO2 19* 19*  GLUCOSE 123* 164*  BUN 50* 51*  CREATININE 4.35* 4.41*  CALCIUM 8.8* 8.5*   LFT No results for input(s): PROT, ALBUMIN, AST, ALT, ALKPHOS, BILITOT, BILIDIR, IBILI in the last 72 hours. PT/INR No results for input(s): LABPROT, INR in the last 72 hours.   Studies/Results: Dg Chest 2 View  Result Date: 01/21/2016 CLINICAL DATA:  Initial evaluation for acute dizziness. History of CHF. EXAM: CHEST  2 VIEW COMPARISON:  Prior radiograph from 01/14/2016. FINDINGS: Median sternotomy wires with underlying CABG markers noted. Cardiomegaly is stable. Mediastinal silhouette within normal limits. Lungs are hypoinflated. Diffuse pulmonary vascular congestion with indistinctness of the interstitial markings, consistent with pulmonary edema. Probable small layering bilateral pleural effusions. No definite focal infiltrates. No pneumothorax. No acute osseous abnormality. IMPRESSION: Cardiomegaly with mild to moderate diffuse pulmonary edema, suggesting acute CHF exacerbation. Electronically Signed   By: Jeannine Boga M.D.   On: 01/21/2016 21:24    Impression/Plan: - Chronic microcytic anemia.  - Occult blood positive stool - MGUS - CHF - Recent diarrhea. Resolved. Had only one bowel  Movement since  this morning  Recommendations --------------------------- - Patient with no overt bleeding. Hemoglobin relatively stable. Discussed with the patient in detail. Offered EGD and colonoscopy for further evaluation for chronic microcytic anemia. Patient  declined endoscopic workup at this time. Wants to have it done after Christmas. - Patient was advised to follow up with Dr. Michail Sermon in 3 to 4 weeks.  - Not much to offer from GI standpoint at this point - GI will sign up. Call us back if needed    LOS: 0 days   Otis Brace  MD, FACP 01/22/2016, 2:02 PM  Pager [phone] If no answer or after 5 PM call [phone]

## 2016-01-22 NOTE — Progress Notes (Signed)
Family Medicine Teaching Service Daily Progress Note Intern Pager: [phone]  [name] Medical record number: [id number] Date of birth: [birth year] Age: 65 y.o. Gender: male  Primary Care Provider: [name], MD Consultants: Vascular surgery, Nephrology, Gastroenterology  Code Status: Full code  Pt Overview and Major Events to Date:  1. Admitted to FMTS  Assessment and Plan: Leonard [name] is a 65 y.o. male presenting with anemia and somnolence. PMH is significant for CKD3, HTN, CAD, diastolic HF, COPD, MGUS, anemia, T2DM  Anemia- patient with chronic anemia both iron deficient and CKD. Baseline hemoglobin appears to be 9. Hemoglobin on admission 7.7. FOBT in ED positive. Patient denies melena or hematochezia. Patient endorses fatigue. No SOB or palpitations.  Hgb 7.8 this AM. Iron studies show Fe 18 and normal TIBC and ferritin.  -vitals per floor -up with assistance -GI consult- patient sees [facility] -continue home protonix - AM cbc -threshold for transfusion <7 - s/p feraheme  Somnolence- unclear etiology. Possibly related to anemia.  AAOx3, does not appear somnolent on my exam.  Ammonia normal. TSH ethanol normal.  TSH to 4.8.  UDS negative.  -PT/OT consult  HFpEF- Last echo May 2017 EF 14-43%, grade 2 diastolic dysfunction. At home takes Torsemide 40-60mg  daily. CXR in ED with mild to moderate pulmonary edema. No signs of fluid overload on exam. Last BNP elevated to 511.8 on 12/11.  BNP to 348 this AM.   UOP 1.25L.  Weight down 5 pounds.  -continue Torsemide 40mg  BID - strict I/Os  CKD: Cr >4 upon admission.  Seen by nephrology in the past and recommended to have fistula placed.  Patient agreed. Had mapping studies done in September.  - Dr. Oneida Alar with Vascular surgery to see for fistula placement - Nephrology consulted; appreciate recs - AM BMP  HTN- takes coreg, clonidine, hydralazine, nifedipine at home. BP 141/64 in ED.  -continue home  medications -monitor BP -continue to taper down clonidine as this may be causing somnolence  T2DM- takes Lantus 60 units daily and 20 units novolog TID -45 units Lantus, can titrate up as needed -moderate sliding scale -CBG AC/HS  Hypothyroidism- synthroid 67mcg at home, TSH 4.8.   -repeat TSH -continue synthroid  FEN/GI: renal diet, carb modified Prophylaxis: lovenox  Disposition: pending clinical improvement  Subjective:  Patient feels well this morning.  Denies any CP, SOB, NVD.    Objective: Temp:  [97.3 F (36.3 C)-98.5 F (36.9 C)] (P) 97.3 F (36.3 C) (12/19 0600) Pulse Rate:  [50-68] (P) 66 (12/19 0600) Resp:  [12-20] (P) 18 (12/19 0600) BP: (124-147)/(55-69) (P) 144/58 (12/19 0600) SpO2:  [91 %-98 %] (P) 98 % (12/19 0600) Weight:  [210 lb 9.6 oz (95.5 kg)-215 lb (97.5 kg)] 210 lb 9.6 oz (95.5 kg) (12/19 0045) Physical Exam: General: 65yo M sitting up in bed in NAD Eyes: non-injected ENTM: MMM Neck: supple, no LAD Cardiovascular: RRR no MRG Respiratory: NWOB, CTABL Gastrointestinal: soft, NTND MSK: warm and well perfused Derm:warm and dry Neuro: CN2-12 in tact. Alert and oriented x 3. Sensation and strength normal throughout Psych: appropriate mood and affect  Laboratory:  Recent Labs Lab 01/17/16 0354 01/17/16 1033 01/21/16 1745 01/22/16 0319  WBC 5.3  --  6.4 5.5  HGB 7.9* 8.4* 7.7* 7.8*  HCT 26.0* 28.0* 25.1* 25.4*  PLT 173  --  170 187    Recent Labs Lab 01/17/16 0354 01/21/16 1745 01/22/16 0319  NA 140 138 137  K 4.1 4.9 4.2  CL 107 107 106  CO2 21* 19* 19*  BUN 52* 50* 51*  CREATININE 3.64* 4.35* 4.41*  CALCIUM 8.3* 8.8* 8.5*  GLUCOSE 169* 123* 164*    Imaging/Diagnostic Tests: Dg Chest 2 View  Result Date: 01/21/2016 CLINICAL DATA:  Initial evaluation for acute dizziness. History of CHF. EXAM: CHEST  2 VIEW COMPARISON:  Prior radiograph from 01/14/2016. FINDINGS: Median sternotomy wires with underlying CABG markers noted.  Cardiomegaly is stable. Mediastinal silhouette within normal limits. Lungs are hypoinflated. Diffuse pulmonary vascular congestion with indistinctness of the interstitial markings, consistent with pulmonary edema. Probable small layering bilateral pleural effusions. No definite focal infiltrates. No pneumothorax. No acute osseous abnormality. IMPRESSION: Cardiomegaly with mild to moderate diffuse pulmonary edema, suggesting acute CHF exacerbation. Electronically Signed   By: Jeannine Boga M.D.   On: 01/21/2016 21:24    Eloise Levels, MD 01/22/2016, 7:05 AM PGY-1, [place] Intern pager: [phone], text pages welcome

## 2016-01-22 NOTE — Discharge Summary (Signed)
[facility] Discharge Summary  [name] Medical record number: [id number] Date of birth: 10-May-1950 Age: 65 y.o. Gender: male Date of Admission: 01/21/2016  Date of Discharge: 01/24/2016 Admitting Physician: Alveda Reasons, MD  Primary Care Provider: [name], MD Consultants: Nephrology, gastroenterology  Indication for Hospitalization: anemia  Discharge Diagnoses/Problem List:  Anemia CKD Somnolence Heart failure HTN Diabetes    Disposition: Discharge home  Discharge Condition: Stable, improved  Discharge Exam:  General: 65yo M sitting up in bed in NAD Eyes: non-injected ENTM: MMM Neck: supple, no LAD Cardiovascular: RRR no MRG Respiratory: NWOB, CTABL Gastrointestinal: soft, NTND MSK: warm and well perfused Derm:warm and dry Neuro: Alert and oriented x3 Psych: appropriate mood and affect  Brief Hospital Course:  65 year old recently admitted for weakness and somnolence was brought in to the emergency department because his home health nurse found to have a fever at home. In the emergency department he was found to be afebrile and had a hemoglobin of 7.7 and his baseline is between 9-10.  He had a positive FOBT in the emergency department.  He was started on Feraheme and his hemoglobin came up to about his baseline. Was seen by gastroenterology who initially offered him a colonoscopy and EGD. Patient declined being interested and would rather do this as an outpatient after Christmas. [facility] and they will call him with an appointment after christmas.  Given his chronic kidney disease we consulted nephrology and also had Dr. Oneida Alar with vascular surgery come see him during admission to evaluate for fistula placement.  He was seen in their office in September and had vein mapping and should be all set for fistula.  Declined being interested during admission. However, ultimately he agreed and  let me schedule an appointment for him for 02/05/2016 for fistula placement.  He was felt to be somnolent during admission and had a workup that included TSH, ammonia, UDS, ethanol and all these were within normal limits.  Patient was discharged with follow-up with his PCP, scheduled fistula placement on 02/05/2016 and also plans to follow-up with [facility] after Christmas for colonoscopy and EGD. Also discharge patient with 325 iron twice a day.   Issues for Follow Up:  1. Anemia: Patient has chronic microcytic anemia. Was found to have hemoglobin of 7.7 on admission and his iron panel showed low iron and an otherwise normal panel. Given positive FOBT patient was offered EGD and colonoscopy during admission, he declined and will follow up outpatient with [facility] after Christmas.  Was discharged with iron 325 mg twice a day 2. Fistula placement: Patient has been recommended to get fistula placed for some time now, he finally agreed to have this done and has a schedule for the procedure on 02/05/2016 with [facility]. 3. Heart failure: Stable during admission. Medication reconciliation shows that Imdur was recommended to be discontinued, this was a mistake. This was discussed with patient and also discussed with heart failure. Medical who follows up with this patient.  Please make sure that this patient is taking this medication.  Called and dicussed this with patient.    Significant Procedures: None  Significant Labs and Imaging:   Recent Labs Lab 01/22/16 0319 01/23/16 0756 01/24/16 1027  WBC 5.5 4.7 5.8  HGB 7.8* 8.3* 8.6*  HCT 25.4* 27.2* 28.0*  PLT 187 193 193    Recent Labs Lab 01/21/16 1745 01/22/16 0319 01/22/16 1605 01/23/16 0357  01/24/16 1027  NA 138 137 139 140 139  K 4.9 4.2 4.7 4.5 4.0  CL 107 106 108 109 108  CO2 19* 19* 21* 20* 21*  GLUCOSE 123* 164* 213* 138* 150*  BUN 50* 51* 47* 44* 51*  CREATININE 4.35* 4.41* 3.88* 3.83* 3.93*   CALCIUM 8.8* 8.5* 8.9 9.0 8.9    Results/Tests Pending at Time of Discharge: None  Discharge Medications:  Allergies as of 01/24/2016   No Known Allergies     Medication List    STOP taking these medications   isosorbide mononitrate 60 MG 24 hr tablet Commonly known as:  IMDUR     TAKE these medications   ADVAIR DISKUS 250-50 MCG/DOSE Aepb Generic drug:  Fluticasone-Salmeterol INHALE ONE PUFF INTO THE LUNGS TWO TIMESDAILY   amLODipine 10 MG tablet Commonly known as:  NORVASC Take 10 mg by mouth daily.   aspirin 81 MG tablet TAKE ONE (1) TABLET BY MOUTH EVERY DAY What changed:  See the new instructions.   atorvastatin 40 MG tablet Commonly known as:  LIPITOR TAKE 1 TABLET BY MOUTH EVERY DAY AT 6 PM What changed:  how much to take  how to take this  when to take this  additional instructions   carvedilol 25 MG tablet Commonly known as:  COREG Take 2 tablets (50 mg total) by mouth 2 (two) times daily with a meal.   cloNIDine 0.2 MG tablet Commonly known as:  CATAPRES Take 1 tablet (0.2 mg total) by mouth 2 (two) times daily.   doxazosin 8 MG tablet Commonly known as:  CARDURA Take 1 tablet (8 mg total) by mouth 2 (two) times daily.   ferrous sulfate 325 (65 FE) MG tablet Commonly known as:  FERROUSUL Take 1 tablet (325 mg total) by mouth 2 (two) times daily with a meal.   hydrALAZINE 100 MG tablet Commonly known as:  APRESOLINE TAKE ONE TABLET BY MOUTH EVERY EIGHT HOURS What changed:  how much to take  how to take this  when to take this  additional instructions   insulin aspart 100 UNIT/ML FlexPen Commonly known as:  NOVOLOG FLEXPEN Inject 20 Units into the skin 3 (three) times daily with meals. What changed:  how much to take   LANTUS SOLOSTAR 100 UNIT/ML Solostar Pen Generic drug:  Insulin Glargine Inject 30 Units into the skin daily.   levothyroxine 50 MCG tablet Commonly known as:  SYNTHROID, LEVOTHROID TAKE ONE (1) TABLET BY  MOUTH EVERY DAY BEFORE BREAKFAST What changed:  See the new instructions.   NIFEdipine 60 MG 24 hr tablet Commonly known as:  PROCARDIA XL/ADALAT-CC Take 2 tablets (120 mg total) by mouth daily.   nitroGLYCERIN 0.4 MG SL tablet Commonly known as:  NITROSTAT Place 1 tablet (0.4 mg total) under the tongue every 5 (five) minutes as needed for chest pain.   pantoprazole 40 MG tablet Commonly known as:  PROTONIX TAKE 1 TABLET BY MOUTH DAILY What changed:  See the new instructions.   potassium chloride SA 20 MEQ tablet Commonly known as:  K-DUR,KLOR-CON Take 51meq (1 tablet ) in the AM and 51meq (1/2 tablet) in the PM What changed:  how much to take  how to take this  when to take this  additional instructions   PROVENTIL HFA 108 (90 Base) MCG/ACT inhaler Generic drug:  albuterol INHALE 2 PUFFS INTO THE LUNGS EVERY 6 HOURS AS NEEDED FOR WHEEZING ORSHORTNESS OF BREATH   torsemide 20 MG tablet Commonly known as:  DEMADEX Take 2 tablets (40 mg total) by mouth 2 (two) times daily. What changed:  how much to take  when to take this  additional instructions       Discharge Instructions: Please refer to Patient Instructions section of EMR for full details.  Patient was counseled important signs and symptoms that should prompt return to medical care, changes in medications, dietary instructions, activity restrictions, and follow up appointments.   Follow-Up Appointments: Follow-up Information    [name], MD Follow up on 02/01/2016.   Specialty:  Family Medicine Why:  @ 4:00PM Contact information: [place] [id number] [phone]           Eloise Levels, MD 01/24/2016, 5:15 PM PGY-1, [place]

## 2016-01-22 NOTE — Progress Notes (Addendum)
[facility] TIR:[id number] [name] is a 65 y.o. (December 30, 1950) male who presents for evaluation for permanent access.  The patient is right hand dominant.  The patient has not had previous access procedures.  Previous central venous cannulation procedures include: none.  The patient has never had a PPM placed.   This patient's has significant HTN, HLD, and DM.  He was previously seen and evaluated by Dr Bridgett Larsson 9/17 and at that time pt was offered left arm AV fistula.  Left arm cephalic vein was 4-5 mm  He has not been willing to proceed with access and is still not ready to proceed with surgery today.  He wants to wait until after Christmas.  He said he will call the office after Christmas and set up surgery.    ROS: no chest pain no dyspnea    Past Medical History:  Diagnosis Date  . Cancer ([place])    skin cancer-  . CHF (congestive heart failure) ([place])   . Coronary artery disease   . Gangrene of toe ([place])    right 5th/notes 01/04/2013   . High cholesterol   . Hypertension   . Iron deficiency anemia 08/03/2015  . 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 PAD (peripheral artery disease) ([place])   . Shortness of breath dyspnea    with exertion  . Type II diabetes mellitus ([facility])    Type II    No current facility-administered medications on file prior to encounter.    Current Outpatient Prescriptions on File Prior to Encounter  Medication Sig Dispense Refill  . ADVAIR DISKUS 250-50 MCG/DOSE AEPB INHALE ONE PUFF INTO THE LUNGS TWO TIMESDAILY 60 each 3  . aspirin 81 MG tablet TAKE ONE (1) TABLET BY MOUTH EVERY DAY (Patient taking differently: TAKE 81 MG BY MOUTH EVERY DAY) 30 tablet 3  . atorvastatin (LIPITOR) 40 MG tablet TAKE 1 TABLET BY MOUTH EVERY DAY AT 6 PM (Patient taking differently: Take 40 mg by mouth daily at 6 PM. ) 90 tablet 3  . carvedilol (COREG) 25 MG tablet Take 2 tablets (50 mg total) by mouth 2 (two) times daily with a  meal. 120 tablet 3  . cloNIDine (CATAPRES) 0.2 MG tablet Take 1 tablet (0.2 mg total) by mouth 2 (two) times daily. 30 tablet 0  . doxazosin (CARDURA) 8 MG tablet Take 1 tablet (8 mg total) by mouth 2 (two) times daily. 60 tablet 6  . hydrALAZINE (APRESOLINE) 100 MG tablet TAKE ONE TABLET BY MOUTH EVERY EIGHT HOURS (Patient taking differently: Take 100 mg by mouth every 8 (eight) hours. ) 90 tablet 3  . insulin aspart (NOVOLOG FLEXPEN) 100 UNIT/ML FlexPen Inject 20 Units into the skin 3 (three) times daily with meals. (Patient taking differently: Inject 2 Units into the skin 3 (three) times daily with meals. ) 15 mL 3  . Insulin Glargine (LANTUS SOLOSTAR) 100 UNIT/ML Solostar Pen Inject 30 Units into the skin daily.     Marland Kitchen levothyroxine (SYNTHROID, LEVOTHROID) 50 MCG tablet TAKE ONE (1) TABLET BY MOUTH EVERY DAY BEFORE BREAKFAST (Patient taking differently: TAKE 50 MCG BY MOUTH EVERY DAY BEFORE BREAKFAST) 30 tablet 3  . NIFEdipine (PROCARDIA XL/ADALAT-CC) 60 MG 24 hr tablet Take 2 tablets (120 mg total) by mouth daily. 60 tablet 3  . nitroGLYCERIN (NITROSTAT) 0.4 MG SL tablet Place 1 tablet (0.4 mg total) under the tongue every 5 (five) minutes as needed for chest pain. 30 tablet 12  . pantoprazole (  PROTONIX) 40 MG tablet TAKE 1 TABLET BY MOUTH DAILY (Patient taking differently: TAKE 40 MG BY MOUTH DAILY) 30 tablet 3  . potassium chloride SA (K-DUR,KLOR-CON) 20 MEQ tablet Take 79meq (1 tablet ) in the AM and 68meq (1/2 tablet) in the PM (Patient taking differently: Take 10-20 mEq by mouth See admin instructions. Take 16meq (1 tablet ) in the AM and 86meq (1/2 tablet) in the PM) 135 tablet 3  . PROVENTIL HFA 108 (90 Base) MCG/ACT inhaler INHALE 2 PUFFS INTO THE LUNGS EVERY 6 HOURS AS NEEDED FOR WHEEZING ORSHORTNESS OF BREATH 6.7 g 3  . torsemide (DEMADEX) 20 MG tablet Take 2 tablets (40 mg total) by mouth 2 (two) times daily. (Patient taking differently: Take 40-60 mg by mouth See admin instructions. Take  60 mg by mouth in the morning and take 40 mg by mouth in the evening) 150 tablet 3  . isosorbide mononitrate (IMDUR) 60 MG 24 hr tablet TAKE ONE (1) TABLET BY MOUTH EVERY DAY (Patient not taking: Reported on 01/22/2016) 30 tablet 3   Social History   Social History  . Marital status: Single    Spouse name: N/A  . Number of children: N/A  . Years of education: N/A   Occupational History  . Not on file.   Social History Main Topics  . Smoking status: Former Smoker    Packs/day: 1.00    Years: 46.00    Types: Cigarettes    Start date: 02/04/1967    Quit date: 02/03/2013  . Smokeless tobacco: Never Used     Comment: Doing well with quitting.  . Alcohol use No  . Drug use: No  . Sexual activity: Not Currently   Other Topics Concern  . Not on file   Social History Narrative   Lives with sister, [name] in [place].  Retired - former Sports coach.    Family History  Problem Relation Age of Onset  . Cancer Mother     lung cancer  . Hypertension Mother   . Cancer Sister     patient thinks it was uterine cancer  . Hypertension Sister   . Heart attack Sister   . Stroke Neg Hx        Objective (!) 145/61 80 98.7 F (37.1 C) (Oral) 20 94%  Intake/Output Summary (Last 24 hours) at 01/22/16 1321 Last data filed at 01/22/16 0900  Gross per 24 hour  Intake              120 ml  Output             1250 ml  Net            -1130 ml    Palpable radial pulse bilaterally Strength 5/5 bil. UE Multiple needle sticks in left cephalic and antecubital veins CTA RRR  Vein Mapping  (Date: 10/24/15):   R arm: acceptable vein conduits include entire cephalic vein and upper arm basilic  L arm: acceptable vein conduits include entire cephalic vein and upper arm basilic    Assessment/Planning:  CKD not yet on HD  Based on vein mapping and examination, this patient's permanent access options include: L RC vs BC AVF, R RC vs BC AVF, B staged BVT.  Per Dr. Lianne Moris note  we will plan left RC vs BC AVF sometime after Christmas.  Pt will need to call office to schedule.  Laurence Slate [facility] 01/22/2016 1:21 PM --   History and exam findings as above.  Left  arm AV fistula when pt willing to consent.  We have now seen him on two occasions and he has refuse to schedule  Please no further needlesticks in left arm as this will be his access site  Will need repeat vein mapping if he waits much longer to make sure recent needlesticks have not injured vein.  Ruta Hinds, MD [facility] Office: [phone] Pager: [phone]  Laboratory Lab Results:  Recent Labs  01/21/16 1745 01/22/16 0319  WBC 6.4 5.5  HGB 7.7* 7.8*  HCT 25.1* 25.4*  PLT 170 187   BMET  Recent Labs  01/21/16 1745 01/22/16 0319  NA 138 137  K 4.9 4.2  CL 107 106  CO2 19* 19*  GLUCOSE 123* 164*  BUN 50* 51*  CREATININE 4.35* 4.41*  CALCIUM 8.8* 8.5*    COAG Lab Results  Component Value Date   INR 1.11 06/19/2015   INR 1.24 04/07/2013   INR 1.06 04/05/2013   No results found for: PTT

## 2016-01-23 LAB — BASIC METABOLIC PANEL
Anion gap: 11 (ref 5–15)
BUN: 44 mg/dL — AB (ref 6–20)
CALCIUM: 9 mg/dL (ref 8.9–10.3)
CO2: 20 mmol/L — AB (ref 22–32)
CREATININE: 3.83 mg/dL — AB (ref 0.61–1.24)
Chloride: 109 mmol/L (ref 101–111)
GFR calc Af Amer: 18 mL/min — ABNORMAL LOW (ref >60)
GFR calc non Af Amer: 15 mL/min — ABNORMAL LOW (ref >60)
GLUCOSE: 138 mg/dL — AB (ref 65–99)
Potassium: 4.5 mmol/L (ref 3.5–5.1)
Sodium: 140 mmol/L (ref 135–145)

## 2016-01-23 LAB — CBC
HEMATOCRIT: 27.2 % — AB (ref 39.0–52.0)
HEMOGLOBIN: 8.3 g/dL — AB (ref 13.0–17.0)
MCH: 22.3 pg — AB (ref 26.0–34.0)
MCHC: 30.5 g/dL (ref 30.0–36.0)
MCV: 73.1 fL — ABNORMAL LOW (ref 78.0–100.0)
Platelets: 193 10*3/uL (ref 150–400)
RBC: 3.72 MIL/uL — ABNORMAL LOW (ref 4.22–5.81)
RDW: 19 % — ABNORMAL HIGH (ref 11.5–15.5)
WBC: 4.7 10*3/uL (ref 4.0–10.5)

## 2016-01-23 LAB — GLUCOSE, CAPILLARY
GLUCOSE-CAPILLARY: 180 mg/dL — AB (ref 65–99)
GLUCOSE-CAPILLARY: 212 mg/dL — AB (ref 65–99)
Glucose-Capillary: 140 mg/dL — ABNORMAL HIGH (ref 65–99)
Glucose-Capillary: 187 mg/dL — ABNORMAL HIGH (ref 65–99)

## 2016-01-23 MED ORDER — SODIUM CHLORIDE 0.9 % IV SOLN: INTRAVENOUS | Status: DC

## 2016-01-23 NOTE — Progress Notes (Signed)
[facility] ROUNDING NOTE   Subjective:   Interval History: no complaints this am   Objective:  Vital signs in last 24 hours:  Temp:  [97.7 F (36.5 C)-98.7 F (37.1 C)] 98.6 F (37 C) (12/20 0536) Pulse Rate:  [62-80] 65 (12/20 0536) Resp:  [18-20] 18 (12/20 0536) BP: (113-152)/(53-61) 113/53 (12/20 0536) SpO2:  [94 %-97 %] 97 % (12/20 0536) Weight:  [91.9 kg (202 lb 9.6 oz)] 91.9 kg (202 lb 9.6 oz) (12/20 0536)  Weight change: -5.625 kg (-12 lb 6.4 oz) Filed Weights   01/21/16 1634 01/22/16 0045 01/23/16 0536  Weight: 97.5 kg (215 lb) 95.5 kg (210 lb 9.6 oz) 91.9 kg (202 lb 9.6 oz)    Intake/Output: I/O last 3 completed shifts: In: 120 [P.O.:120] Out: 3751 [Urine:3750; Stool:1]   Intake/Output this shift:  Total I/O In: 240 [P.O.:240] Out: -   CVS- RRR RS- CTA ABD- BS present soft non-distended EXT- no edema   Basic Metabolic Panel:  Recent Labs Lab 01/17/16 0354 01/21/16 1745 01/22/16 0319 01/22/16 1605 01/23/16 0357  NA 140 138 137 139 140  K 4.1 4.9 4.2 4.7 4.5  CL 107 107 106 108 109  CO2 21* 19* 19* 21* 20*  GLUCOSE 169* 123* 164* 213* 138*  BUN 52* 50* 51* 47* 44*  CREATININE 3.64* 4.35* 4.41* 3.88* 3.83*  CALCIUM 8.3* 8.8* 8.5* 8.9 9.0    Liver Function Tests: No results for input(s): AST, ALT, ALKPHOS, BILITOT, PROT, ALBUMIN in the last 168 hours. No results for input(s): LIPASE, AMYLASE in the last 168 hours.  Recent Labs Lab 01/22/16 0054  AMMONIA 21    CBC:  Recent Labs Lab 01/17/16 0354 01/17/16 1033 01/21/16 1745 01/22/16 0319 01/23/16 0756  WBC 5.3  --  6.4 5.5 4.7  NEUTROABS  --   --  4.6  --   --   HGB 7.9* 8.4* 7.7* 7.8* 8.3*  HCT 26.0* 28.0* 25.1* 25.4* 27.2*  MCV 72.0*  --  72.3* 73.4* 73.1*  PLT 173  --  170 187 193    Cardiac Enzymes: No results for input(s): CKTOTAL, CKMB, CKMBINDEX, TROPONINI in the last 168 hours.  BNP: Invalid input(s): POCBNP  CBG:  Recent Labs Lab 01/22/16 0620  01/22/16 1112 01/22/16 1646 01/22/16 2112 01/23/16 0535  GLUCAP 184* 197* 209* 143* 140*    Microbiology: Results for orders placed or performed during the hospital encounter of 01/13/16  Blood Culture (routine x 2)     Status: None   Collection Time: 01/13/16 11:25 PM  Result Value Ref Range Status   Specimen Description BLOOD RIGHT ARM  Final   Special Requests BOTTLES DRAWN AEROBIC AND ANAEROBIC 5CC  Final   Culture NO GROWTH 5 DAYS  Final   Report Status 01/19/2016 FINAL  Final  Blood Culture (routine x 2)     Status: None   Collection Time: 01/13/16 11:31 PM  Result Value Ref Range Status   Specimen Description BLOOD RIGHT ARM  Final   Special Requests BOTTLES DRAWN AEROBIC AND ANAEROBIC 5CC  Final   Culture NO GROWTH 5 DAYS  Final   Report Status 01/19/2016 FINAL  Final  Urine culture     Status: Abnormal   Collection Time: 01/14/16  1:22 AM  Result Value Ref Range Status   Specimen Description URINE, RANDOM  Final   Special Requests NONE  Final   Culture >=100,000 COLONIES/mL ESCHERICHIA COLI (A)  Final   Report Status 01/17/2016 FINAL  Final  Organism ID, Bacteria ESCHERICHIA COLI (A)  Final      Susceptibility   Escherichia coli - MIC*    AMPICILLIN >=32 RESISTANT Resistant     CEFAZOLIN >=64 RESISTANT Resistant     CEFTRIAXONE <=1 SENSITIVE Sensitive     CIPROFLOXACIN 0.5 SENSITIVE Sensitive     GENTAMICIN >=16 RESISTANT Resistant     IMIPENEM <=0.25 SENSITIVE Sensitive     NITROFURANTOIN <=16 SENSITIVE Sensitive     TRIMETH/SULFA <=20 SENSITIVE Sensitive     AMPICILLIN/SULBACTAM >=32 RESISTANT Resistant     PIP/TAZO 8 SENSITIVE Sensitive     Extended ESBL NEGATIVE Sensitive     * >=100,000 COLONIES/mL ESCHERICHIA COLI  MRSA PCR Screening     Status: None   Collection Time: 01/14/16  3:25 AM  Result Value Ref Range Status   MRSA by PCR NEGATIVE NEGATIVE Final    Comment:        The GeneXpert MRSA Assay (FDA approved for NASAL specimens only), is one  component of a comprehensive MRSA colonization surveillance program. It is not intended to diagnose MRSA infection nor to guide or monitor treatment for MRSA infections.     Coagulation Studies: No results for input(s): LABPROT, INR in the last 72 hours.  Urinalysis:  Recent Labs  01/21/16 1951  COLORURINE STRAW*  LABSPEC 1.009  PHURINE 5.0  GLUCOSEU NEGATIVE  HGBUR NEGATIVE  BILIRUBINUR NEGATIVE  KETONESUR NEGATIVE  PROTEINUR 100*  NITRITE NEGATIVE  LEUKOCYTESUR NEGATIVE      Imaging: Dg Chest 2 View  Result Date: 01/21/2016 CLINICAL DATA:  Initial evaluation for acute dizziness. History of CHF. EXAM: CHEST  2 VIEW COMPARISON:  Prior radiograph from 01/14/2016. FINDINGS: Median sternotomy wires with underlying CABG markers noted. Cardiomegaly is stable. Mediastinal silhouette within normal limits. Lungs are hypoinflated. Diffuse pulmonary vascular congestion with indistinctness of the interstitial markings, consistent with pulmonary edema. Probable small layering bilateral pleural effusions. No definite focal infiltrates. No pneumothorax. No acute osseous abnormality. IMPRESSION: Cardiomegaly with mild to moderate diffuse pulmonary edema, suggesting acute CHF exacerbation. Electronically Signed   By: Jeannine Boga M.D.   On: 01/21/2016 21:24     Medications:    . aspirin  81 mg Oral Daily  . atorvastatin  40 mg Oral q1800  . carvedilol  50 mg Oral BID WC  . cloNIDine  0.2 mg Oral BID  . doxazosin  8 mg Oral BID  . enoxaparin (LOVENOX) injection  30 mg Subcutaneous Q24H  . hydrALAZINE  100 mg Oral Q8H  . insulin aspart  0-15 Units Subcutaneous TID WC  . insulin glargine  45 Units Subcutaneous Daily  . isosorbide mononitrate  60 mg Oral Daily  . levothyroxine  50 mcg Oral QAC breakfast  . mometasone-formoterol  2 puff Inhalation BID  . NIFEdipine  120 mg Oral Daily  . pantoprazole  40 mg Oral Daily  . torsemide  40 mg Oral BID   acetaminophen **OR**  acetaminophen, albuterol, nitroGLYCERIN, polyethylene glycol  Assessment/ Plan:  65 year old gentleman with a history of CHF (last echo 06/05/2015: LVEF 60% to 65%), CAD s/p CABG 2015, stage 4 CKD presenting via EMS with dizziness. Patient denies dizziness and reports being here because his weekly home visiting nurse noted he had a fever. He denies the use of any antipyretics today. Review of systems negative except for aloose stool. He denies dizziness, chest pain, shortness of breath, nausea/vomiting, chills or any other symptoms.  Stage 4/5  Chronic renal disease was evaluated 12/13 by Dr  Joelyn Oms   He has been reluctant to have an AVF placed at the last evaluation and follows with Dr Marval Regal as an outpatient  His Anemia is treated by Dr Alvy Bimler   He is iron deficient  Will replace IV iron  His BP is controlled as is his volume status    Stable this morning and will sign off today    LOS: 0 Leonard [name] @TODAY @10 :20 AM

## 2016-01-23 NOTE — Evaluation (Signed)
Physical Therapy Evaluation Patient Details Name: Leonard [name] MRN: [id number] DOB: [birth year] Today's Date: 01/23/2016   History of Present Illness  65 y.o. male presenting with weakness and dizziness . PMH is significant for stage 4 CKD, CHF, HTN, CAD, PVD, hx CABG, COPD, HLD, anemia,  hypothyroidism, T2DM  Clinical Impression  Patient demonstrates deficits in functional mobility as indicated below. Will need continued skilled PT to address deficits and maximize function. Will see as indicated and progress as tolerated.     Follow Up Recommendations Supervision/Assistance - 24 hour;No PT follow up    Equipment Recommendations  None recommended by PT    Recommendations for Other Services       Precautions / Restrictions Precautions Precautions: Fall Restrictions Weight Bearing Restrictions: No      Mobility  Bed Mobility Overal bed mobility: Modified Independent                Transfers Overall transfer level: Needs assistance Equipment used: Straight cane Transfers: Sit to/from Stand;Stand Pivot Transfers Sit to Stand: Supervision         General transfer comment: use of straight cane for stability  Ambulation/Gait Ambulation/Gait assistance: Min guard Ambulation Distance (Feet): 180 Feet Assistive device: Straight cane Gait Pattern/deviations: Step-through pattern;Staggering right;Staggering left;Narrow base of support;Trunk flexed;Drifts right/left Gait velocity: decreased Gait velocity interpretation: Below normal speed for age/gender General Gait Details: some modest instability noted with ambulation, patient able to self correct without physical assist. Cues for safety at times in relation to navigation of environment. Limited by fatigue however, VSS with saturations >93%  Stairs            Wheelchair Mobility    Modified Rankin (Stroke Patients Only)       Balance     Sitting balance-Leahy Scale: Good       Standing  balance-Leahy Scale: Fair Standing balance comment: reliance on UE support             High level balance activites: Side stepping;Direction changes;Turns High Level Balance Comments: min guard one episode of min assist during head turn to the left             Pertinent Vitals/Pain Pain Assessment: No/denies pain    Home Living Family/patient expects to be discharged to:: Private residence Living Arrangements: Other relatives Available Help at Discharge: Family;Available PRN/intermittently Type of Home: Apartment Home Access: Level entry     Home Layout: One level Home Equipment: Clinical cytogeneticist - 2 wheels;Adaptive equipment;Cane - quad      Prior Function Level of Independence: Needs assistance   Gait / Transfers Assistance Needed: modified independent with RW  ADL's / Homemaking Assistance Needed: Pt has aid 3x week to assist with shower.  Pt is able to assist with bathing but fatigues so quickly with bathing and dressing that often times cannot complete the tasks before lunch time. Sister cooks and Armed forces logistics/support/administrative officer Dominance   Dominant Hand: Right    Extremity/Trunk Assessment   Upper Extremity Assessment Upper Extremity Assessment: Overall WFL for tasks assessed    Lower Extremity Assessment Lower Extremity Assessment: Overall WFL for tasks assessed    Cervical / Trunk Assessment Cervical / Trunk Assessment: Normal  Communication   Communication: No difficulties  Cognition Arousal/Alertness: Awake/alert Behavior During Therapy: Impulsive Overall Cognitive Status: No family/caregiver present to determine baseline cognitive functioning  General Comments      Exercises     Assessment/Plan    PT Assessment Patient needs continued PT services  PT Problem List Decreased strength;Decreased activity tolerance;Decreased balance;Decreased mobility;Decreased cognition;Decreased safety  awareness;Cardiopulmonary status limiting activity          PT Treatment Interventions DME instruction;Gait training;Stair training;Functional mobility training;Therapeutic activities;Therapeutic exercise;Balance training;Patient/family education    PT Goals (Current goals can be found in the Care Plan section)  Acute Rehab PT Goals Patient Stated Goal: to go home PT Goal Formulation: With patient Time For Goal Achievement: 02/06/16 Potential to Achieve Goals: Fair    Frequency Min 3X/week   Barriers to discharge        Co-evaluation               End of Session Equipment Utilized During Treatment: Gait belt Activity Tolerance: Patient tolerated treatment well;Patient limited by fatigue Patient left: in bed;with call bell/phone within reach;with bed alarm set Nurse Communication: Mobility status    Functional Assessment Tool Used: clinical judgement Functional Limitation: Mobility: Walking and moving around Mobility: Walking and Moving Around Current Status [phone]): At least 20 percent but less than 40 percent impaired, limited or restricted Mobility: Walking and Moving Around Goal Status [phone]): At least 1 percent but less than 20 percent impaired, limited or restricted    Time: 1427-1446 PT Time Calculation (min) (ACUTE ONLY): 19 min   Charges:   PT Evaluation $PT Eval Moderate Complexity: 1 Procedure     PT G Codes:   PT G-Codes **NOT FOR INPATIENT CLASS** Functional Assessment Tool Used: clinical judgement Functional Limitation: Mobility: Walking and moving around Mobility: Walking and Moving Around Current Status (F7588): At least 20 percent but less than 40 percent impaired, limited or restricted Mobility: Walking and Moving Around Goal Status [phone]): At least 1 percent but less than 20 percent impaired, limited or restricted    Duncan Dull 01/23/2016, 5:04 PM Alben Deeds, [place] DPT  [phone]

## 2016-01-23 NOTE — Progress Notes (Signed)
Occupational Therapy Evaluation Patient Details Name: KIYAN BURMESTER MRN: [id number] DOB: [birth year] Today's Date: 01/23/2016    History of Present Illness 65 y.o. male presenting with weakness and dizziness . PMH is significant for stage 4 CKD, CHF, HTN, CAD, PVD, hx CABG, COPD, HLD, anemia,  hypothyroidism, T2DM   Clinical Impression   PTA, pt lived alone and had PCA 3 days/week. Sister present and states his sister helps "some" but is limited because of a medical condition. Pt states he has as "much help as he needs". Pt seen last visit and 24/7 S was recommended. Sister present and states pt can not manage his medication - states pt has to be told when to take his medication. States she finds his medication on the floor and pt does not remember if he has taken it. Pt given the [facility] Cognitive Assessment and received a score of 21/30 (under 26 is considered impaired). Pt's greatest deficits were with executive level thinking and delayed recall. Pt appeared to be functioning at this level cognitively during his last visit. Pt needs 24/7 S for safe DC home. Sister states they are not able to provide this level of care. Recommend pt have [facility] for health and medication management. Pt would benefit from [facility], but he has Medicaid and these services are not covered. Pt is a fall risk. Attempted to discuss DC concerns with case management.     Follow Up Recommendations  Supervision/Assistance - 24 hour;Other (comment)    Equipment Recommendations  Tub/shower seat    Recommendations for Other Services       Precautions / Restrictions Precautions Precautions: Fall      Mobility Bed Mobility Overal bed mobility: Modified Independent                Transfers Overall transfer level: Needs assistance   Transfers: Sit to/from Stand;Stand Pivot Transfers Sit to Stand: Supervision Stand pivot transfers: Min guard       General transfer comment: LOB x 1    Balance      Sitting balance-Leahy Scale: Good       Standing balance-Leahy Scale: Fair                              ADL                          Poor insight Impulsive during ADL              Functional mobility during ADLs: Min guard General ADL Comments: Pt requries S for all ADL due to cognition. Pt requries cues to complete tasks. Pt was cued to wah hands after using the bathrom then walked to the bed with hands dripping wel. Pt asked "why didn't you dry your hand?" Pt stated, "well where was the towels?", which were located on the counter.      Vision  wears glasses but does not have them   Perception     Praxis      Pertinent Vitals/Pain Pain Assessment: No/denies pain     Hand Dominance Right   Extremity/Trunk Assessment Upper Extremity Assessment Upper Extremity Assessment: Overall WFL for tasks assessed   Lower Extremity Assessment Lower Extremity Assessment: Defer to PT evaluation   Cervical / Trunk Assessment Cervical / Trunk Assessment: Normal   Communication Communication Communication: No difficulties   Cognition Arousal/Alertness: Awake/alert Behavior During Therapy: Impulsive  Given the [facility] - scored 21/30 Deficits with executive level skills, verbal fluency (could think of 7 words that started with "F" in 1 min.  Deficits with delayed recall - unable to remember of the 5 words read earlier  Poor awareness - emergent Poor insight into deficits                 General Comments       Exercises       Shoulder Instructions      Home Living Family/patient expects to be discharged to:: Private residence Living Arrangements: Other relatives Available Help at Discharge: Family;Available PRN/intermittently Type of Home: Apartment Home Access: Level entry     Home Layout: One level     Bathroom Shower/Tub: Tub/shower unit Shower/tub characteristics: Curtain Biochemist, clinical: Handicapped height     Home  Equipment: Clinical cytogeneticist - 2 wheels;Adaptive equipment;Cane - quad Adaptive Equipment: Reacher;Sock aid;Long-handled shoe horn;Long-handled sponge        Prior Functioning/Environment Level of Independence: Needs assistance  Gait / Transfers Assistance Needed: modified independent with RW ADL's / Homemaking Assistance Needed: Pt has aid 3x week to assist with shower.  Pt is able to assist with bathing but fatigues so quickly with bathing and dressing that often times cannot complete the tasks before lunch time. Sister cooks and occasionally cleans            OT Problem List: Decreased activity tolerance;Impaired balance (sitting and/or standing);Decreased cognition;Decreased safety awareness;Decreased knowledge of use of DME or AE   OT Treatment/Interventions: Self-care/ADL training;Energy conservation;DME and/or AE instruction;Therapeutic activities;Cognitive remediation/compensation;Balance training;Patient/family education    OT Goals(Current goals can be found in the care plan section) Acute Rehab OT Goals Patient Stated Goal: to go home OT Goal Formulation: With patient Time For Goal Achievement: 02/06/16 Potential to Achieve Goals: Good ADL Goals Pt Will Perform Lower Body Bathing: with modified independence;sit to/from stand Pt Will Perform Lower Body Dressing: with modified independence;sit to/from stand Pt Will Transfer to Toilet: with modified independence;ambulating Additional ADL Goal #1: Family will verbalize understanding of need to assist pt with all medicaiton management Additional ADL Goal #2: Pt will demonstrate emergent awareness during ADL task with min vc  OT Frequency: Min 2X/week   Barriers to D/C: Decreased caregiver support          Co-evaluation              End of Session Equipment Utilized During Treatment: Gait belt Nurse Communication: Mobility status;Other (comment) (DC conerns)  Activity Tolerance: Patient tolerated treatment  well Patient left: in bed;with call bell/phone within reach;with family/visitor present   Time: 0850-0920 OT Time Calculation (min): 30 min Charges:  OT General Charges $OT Visit: 1 Procedure OT Evaluation $OT Eval Moderate Complexity: 1 Procedure OT Treatments $Therapeutic Activity: 8-22 mins G-Codes: OT G-codes **NOT FOR INPATIENT CLASS** Functional Assessment Tool Used: clinical judgement Functional Limitation: Self care Self Care Current Status (Q7619): At least 1 percent but less than 20 percent impaired, limited or restricted Self Care Goal Status (J0932): At least 1 percent but less than 20 percent impaired, limited or restricted  B[place],HILLARY 01/23/2016, 10:01 AM  [facility], OTR/L  [phone] 01/23/2016

## 2016-01-23 NOTE — Progress Notes (Addendum)
Spent 30 minutes speaking with sister of pt, as sister wanted Plan of Care update, specifically why pt was not having a colonoscopy while inpatient.  I read the note written from GI, stating that pt refused to have colonoscopy until after Christmas.  Pt is alert and oriented is capable of making healthcare decisions for himself.  Sister unable to grasp this information.  I spoke to attending physician to please return to room to ask pt again if he would be willing to have colonoscopy.  Otherwise, attending physician states that he will be discharged.

## 2016-01-23 NOTE — Progress Notes (Signed)
Transitions of Care Pharmacy Note  Plan:  -Educated on medication regimen  -Addressed dose changes regarding insulin dose. Patient was made aware that his insulin dose has changed while he has been in the hospital and may be different when he leaves for home.  -Recommend following up HD plans as ACEI/ARB would be indicated if HD pursued. Had previously been on lisinopril but that was discontinued due to worsening renal function. --------------------------------------------- Leonard [name] is an 65 y.o. male who presents with a chief complaint of sleepiness. In anticipation of discharge, pharmacy has reviewed this patient's prior to admission medication history, as well as current inpatient medications listed per the [facility].  Current medication indications, dosing, frequency, and notable side effects reviewed with patient and family. Patient and family verbalized understanding of current inpatient medication regimen and is aware that the After Visit Summary when presented, will represent the most accurate medication list at discharge.   Leonard [name] did not express any concerns regarding his medications  Assessment: Understanding of regimen: poor Understanding of indications: poor Potential of compliance: good Barriers to Obtaining Medications: No  Patient instructed to contact inpatient pharmacy team with further questions or concerns if needed. Patient could not tell me the indication or dosing of many of his medications, but does have help at home for adherence.   Time spent preparing for discharge counseling: 15 Time spent counseling patient: 20   Thank you for allowing pharmacy to be a part of this patient's care.  Myer Peer Grayland Ormond), PharmD  PGY1 Pharmacy Resident Pager: [phone] 01/23/2016 6:09 PM

## 2016-01-23 NOTE — Progress Notes (Signed)
Family Medicine Teaching Service Daily Progress Note Intern Pager: [phone]  [name] Medical record number: [id number] Date of birth: [birth year] Age: 65 y.o. Gender: male  Primary Care Provider: [name], MD Consultants: Vascular surgery, Nephrology, Gastroenterology  Code Status: Full code  Pt Overview and Major Events to Date:  1. Admitted to FMTS  Assessment and Plan: Leonard [name] is a 65 y.o. male presenting with anemia and somnolence. PMH is significant for CKD3, HTN, CAD, diastolic HF, COPD, MGUS, anemia, T2DM  Anemia- patient with chronic anemia both iron deficient and CKD. Baseline hemoglobin appears to be 9. Hemoglobin on admission 7.7. FOBT in ED positive. Patient denies melena or hematochezia. Patient endorses fatigue. No SOB or palpitations. Iron studies show Fe 18 and normal TIBC and ferritin. Seen by GI and offered EGD/conolonscopy for chronic microcytic anemia and patient declined.  Hgb this AM 8.3 s/p feraheme.  -vitals per floor -up with assistance -GI consult- Signed off-- EGD[facility]o after [facility] at [facility] -continue home protonix - AM cbc -threshold for transfusion <7 - DC today with iron supplementation  Somnolence- unclear etiology. Possibly related to anemia.  AAOx3, does not appear somnolent on my exam.  Ammonia normal. TSH ethanol normal.  TSH to 4.8.  UDS negative.  -PT/OT consult  HFpEF- Last echo May 2017 EF 58-30%, grade 2 diastolic dysfunction. At home takes Torsemide 40-60mg  daily. CXR in ED with mild to moderate pulmonary edema. No signs of fluid overload on exam. Last BNP elevated to 511.8 on 12/11. UOP 2.5L Weight down 8 pounds. -continue Torsemide 40mg  BID - strict I/Os  CKD: Cr >4 upon admission.  Seen by nephrology in the past and recommended to have fistula placed.  Was seen by vascular surgery and patient refused to schedule for placement. Agreed to let me schedule appointment for him after christmas.   -schedule appt for fistula - Nephrology consulted; appreciate recs - AM BMP  HTN- takes coreg, clonidine, hydralazine, nifedipine at home. BP 115/53 this AM.  -continue home medications -monitor BP -continue to taper down clonidine as this may be causing somnolence  T2DM- takes Lantus 60 units daily and 20 units novolog TID.  CBG this AM is 140.  -45 units Lantus, can titrate up as needed -moderate sliding scale -CBG AC/HS  Hypothyroidism- synthroid 33mcg at home, TSH 4.8.   -repeat TSH -continue synthroid  FEN/GI: renal diet, carb modified Prophylaxis: lovenox  Disposition: pending clinical improvement  Subjective:  Patient feels well this morning.  Denies any CP, SOB, NVD.    Objective: Temp:  [97.7 F (36.5 C)-98.7 F (37.1 C)] 98.6 F (37 C) (12/20 0536) Pulse Rate:  [62-80] 65 (12/20 0536) Resp:  [18-20] 18 (12/20 0536) BP: (113-152)/(53-61) 113/53 (12/20 0536) SpO2:  [94 %-97 %] 97 % (12/20 0536) Weight:  [202 lb 9.6 oz (91.9 kg)] 202 lb 9.6 oz (91.9 kg) (12/20 0536) Physical Exam: General: 65yo M sitting up in bed in NAD Eyes: non-injected ENTM: MMM Neck: supple, no LAD Cardiovascular: RRR no MRG Respiratory: NWOB, CTABL Gastrointestinal: soft, NTND MSK: warm and well perfused Derm:warm and dry Neuro: CN2-12 in tact. Alert and oriented x 3. Sensation and strength normal throughout Psych: appropriate mood and affect  Laboratory:  Recent Labs Lab 01/17/16 0354 01/17/16 1033 01/21/16 1745 01/22/16 0319  WBC 5.3  --  6.4 5.5  HGB 7.9* 8.4* 7.7* 7.8*  HCT 26.0* 28.0* 25.1* 25.4*  PLT 173  --  170 187    Recent Labs  Lab 01/22/16 0319 01/22/16 1605 01/23/16 0357  NA 137 139 140  K 4.2 4.7 4.5  CL 106 108 109  CO2 19* 21* 20*  BUN 51* 47* 44*  CREATININE 4.41* 3.88* 3.83*  CALCIUM 8.5* 8.9 9.0  GLUCOSE 164* 213* 138*    Imaging/Diagnostic Tests: Dg Chest 2 View  Result Date: 01/21/2016 CLINICAL DATA:  Initial evaluation for acute  dizziness. History of CHF. EXAM: CHEST  2 VIEW COMPARISON:  Prior radiograph from 01/14/2016. FINDINGS: Median sternotomy wires with underlying CABG markers noted. Cardiomegaly is stable. Mediastinal silhouette within normal limits. Lungs are hypoinflated. Diffuse pulmonary vascular congestion with indistinctness of the interstitial markings, consistent with pulmonary edema. Probable small layering bilateral pleural effusions. No definite focal infiltrates. No pneumothorax. No acute osseous abnormality. IMPRESSION: Cardiomegaly with mild to moderate diffuse pulmonary edema, suggesting acute CHF exacerbation. Electronically Signed   By: Leonard [name] M.D.   On: 01/21/2016 21:24    Leonard Levels, MD 01/23/2016, 7:13 AM PGY-1, [place] Intern pager: [phone], text pages welcome

## 2016-01-24 ENCOUNTER — Other Ambulatory Visit: Payer: Self-pay | Admitting: *Deleted

## 2016-01-24 ENCOUNTER — Other Ambulatory Visit: Payer: Self-pay | Admitting: Family Medicine

## 2016-01-24 ENCOUNTER — Telehealth (HOSPITAL_COMMUNITY): Payer: Self-pay | Admitting: Pharmacist

## 2016-01-24 ENCOUNTER — Telehealth: Payer: Self-pay | Admitting: Family Medicine

## 2016-01-24 LAB — GLUCOSE, CAPILLARY
Glucose-Capillary: 111 mg/dL — ABNORMAL HIGH (ref 65–99)
Glucose-Capillary: 170 mg/dL — ABNORMAL HIGH (ref 65–99)

## 2016-01-24 LAB — CBC
HEMATOCRIT: 28 % — AB (ref 39.0–52.0)
HEMOGLOBIN: 8.6 g/dL — AB (ref 13.0–17.0)
MCH: 22.3 pg — AB (ref 26.0–34.0)
MCHC: 30.7 g/dL (ref 30.0–36.0)
MCV: 72.5 fL — ABNORMAL LOW (ref 78.0–100.0)
Platelets: 193 10*3/uL (ref 150–400)
RBC: 3.86 MIL/uL — ABNORMAL LOW (ref 4.22–5.81)
RDW: 19.1 % — AB (ref 11.5–15.5)
WBC: 5.8 10*3/uL (ref 4.0–10.5)

## 2016-01-24 LAB — BASIC METABOLIC PANEL
Anion gap: 10 (ref 5–15)
BUN: 51 mg/dL — AB (ref 6–20)
CALCIUM: 8.9 mg/dL (ref 8.9–10.3)
CO2: 21 mmol/L — AB (ref 22–32)
CREATININE: 3.93 mg/dL — AB (ref 0.61–1.24)
Chloride: 108 mmol/L (ref 101–111)
GFR calc Af Amer: 17 mL/min — ABNORMAL LOW (ref >60)
GFR calc non Af Amer: 15 mL/min — ABNORMAL LOW (ref >60)
Glucose, Bld: 150 mg/dL — ABNORMAL HIGH (ref 65–99)
Potassium: 4 mmol/L (ref 3.5–5.1)
Sodium: 139 mmol/L (ref 135–145)

## 2016-01-24 MED ORDER — FERROUS SULFATE 325 (65 FE) MG PO TABS: 325.0000 mg | ORAL_TABLET | Freq: Two times a day (BID) | ORAL | 3 refills | Status: DC

## 2016-01-24 MED ORDER — ACCU-CHEK AVIVA PLUS W/DEVICE KIT: PACK | 0 refills | Status: DC

## 2016-01-24 NOTE — Progress Notes (Signed)
Pt has orders to be discharged. Discharge instructions given and pt has no additional questions at this time. Medication regimen reviewed and pt educated. Pt verbalized understanding and has no additional questions. Telemetry box removed. IV removed and site in good condition. Pt stable and waiting for transportation. 

## 2016-01-24 NOTE — Telephone Encounter (Signed)
Pt is being released from the hospital today and [facility] see the patient 3 days a week. His sister and neighbor said that he is declining and he should not be alone. The sister is requesting everyday for skilled nursing for the patient. He can no longer be by himself. He was by his self and he ended back in the hospital since he didn't have any help. Please call [facility] and authorize more days/hours for the patient. jw

## 2016-01-24 NOTE — Progress Notes (Signed)
Family Medicine Teaching Service Daily Progress Note Intern Pager: [phone]  [name] Medical record number: [id number] Date of birth: [birth year] Age: 65 y.o. Gender: male  Primary Care Provider: [name], MD Consultants: Vascular surgery, Nephrology, Gastroenterology  Code Status: Full code  Pt Overview and Major Events to Date:  1. Admitted to FMTS  Assessment and Plan: Leonard [name] is a 65 y.o. male presenting with anemia and somnolence. PMH is significant for CKD3, HTN, CAD, diastolic HF, COPD, MGUS, anemia, T2DM  Anemia- patient with chronic anemia both iron deficient and CKD. Patient endorses fatigue. No SOB or palpitations. Seen by GI during admission and initially offered EGD/conolonscopy for chronic microcytic anemia and patient declined. Spoke with [facility] this AM and they will schedule appointment for patient to receive procedure outpatient.  AM Hgb pending.  -vitals per floor -up with assistance -continue home protonix - AM cbc -threshold for transfusion <7 - DC today with 325 Fe BID  Somnolence- unclear etiology. Possibly related to anemia.  AAOx3, does not appear somnolent on my exam.  Ammonia normal. TSH ethanol normal.  TSH to 4.8.  UDS negative.  -PT/OT consult  HFpEF- Last echo May 2017 EF 20-35%, grade 2 diastolic dysfunction. At home takes Torsemide 40-60mg  daily. CXR in ED with mild to moderate pulmonary edema. No signs of fluid overload on exam.  UOP 2.1L  Weight stable -continue Torsemide 40mg  BID - strict I/Os  CKD: Cr >4 upon admission.  Seen by nephrology in the past and recommended to have fistula placed.  Was seen by vascular surgery and patient refused to schedule for placement. Agreed to let me schedule appointment for him after christmas. AM Cr pending.  -fistula scheduled for 1/2 - Nephrology consulted; appreciate recs - AM BMP  HTN- takes coreg, clonidine, hydralazine, nifedipine at home. BP 135/76 this AM.   -continue home medications -monitor BP -continue to taper down clonidine as this may be causing somnolence  T2DM- takes Lantus 60 units daily and 20 units novolog TID.  CBG this AM is 111 -45 units Lantus, can titrate up as needed -moderate sliding scale -CBG AC/HS  Hypothyroidism- synthroid 54mcg at home, TSH 4.8.   -continue synthroid  FEN/GI: renal diet, carb modified Prophylaxis: lovenox  Disposition: pending clinical improvement  Subjective:  Patient feels well this morning.  Denies any CP, SOB, NVD.    Objective: Temp:  [98.1 F (36.7 C)-98.6 F (37 C)] 98.6 F (37 C) (12/21 0505) Pulse Rate:  [57-65] 65 (12/21 0505) Resp:  [18] 18 (12/21 0505) BP: (114-134)/(52-99) 114/52 (12/21 0505) SpO2:  [93 %-99 %] 99 % (12/21 0505) Weight:  [202 lb 8 oz (91.9 kg)] 202 lb 8 oz (91.9 kg) (12/21 0505) Physical Exam: General: 65yo M sitting up in bed in NAD Eyes: non-injected ENTM: MMM Neck: supple, no LAD Cardiovascular: RRR no MRG Respiratory: NWOB, CTABL Gastrointestinal: soft, NTND MSK: warm and well perfused Derm:warm and dry Neuro: Alert and oriented x3 Psych: appropriate mood and affect  Laboratory:  Recent Labs Lab 01/21/16 1745 01/22/16 0319 01/23/16 0756  WBC 6.4 5.5 4.7  HGB 7.7* 7.8* 8.3*  HCT 25.1* 25.4* 27.2*  PLT 170 187 193    Recent Labs Lab 01/22/16 0319 01/22/16 1605 01/23/16 0357  NA 137 139 140  K 4.2 4.7 4.5  CL 106 108 109  CO2 19* 21* 20*  BUN 51* 47* 44*  CREATININE 4.41* 3.88* 3.83*  CALCIUM 8.5* 8.9 9.0  GLUCOSE 164* 213*  138*    Imaging/Diagnostic Tests: No results found.  Eloise Levels, MD 01/24/2016, 6:47 AM PGY-1, [place] Intern pager: [phone], text pages welcome

## 2016-01-24 NOTE — Telephone Encounter (Signed)
[name] Journalist, newspaper) called stating that upon patient's discharge today, his Imdur was discontinued and she wanted to verify that this was correct. I did not see any notes explaining why it was discontinued so I called the FM resident who stated that it was mistakenly discontinued. I have relayed this to [name] who will have the patient continue it.   Ruta Hinds. Velva Harman, PharmD, BCPS, CPP Clinical Pharmacist Pager: [phone] Phone: [phone] 01/24/2016 3:57 PM

## 2016-01-24 NOTE — Discharge Instructions (Signed)
You were admitted to the hospital and were found to have a hemoglobin level lower than your usual level.  We gave you some iron and your level came back up to normal.  We consulted gastroenterology and they will call you to scheduled procedures for after Christmas.  We also discussed the need for you to get a fistula placed and I scheduled that appointment for you to have the procedure done on January 2. The vascular surgery office with recalling you tomorrow with details.  I will be discharging you with some iron that I would like you to take.  I would like you to take 325mg  two times daily until you follow up at the gastroenterology office.

## 2016-01-24 NOTE — Progress Notes (Signed)
(  No charge)  I spoke with Dr. Rosalyn Gess earlier today, about the fact that the patient, at the prompting of his sister, is now agreeable to GI tract evaluation. Since the patient was otherwise ready for discharge, and since this is an elective procedure, it was felt most appropriate to accomplish this as an outpatient.  I went by the patient's room and explained the pros and cons of keeping him here in the hospital for the procedure, versus doing it as an outpatient, and he is agreeable to my recommendation for outpatient management.  I have made a note on our office medical record, for the patient's primary gastroenterologist, Dr. Wilford Corner, to contact the patient next week to make arrangements for outpatient follow-up of his heme positivity and anemia.  Cleotis Nipper, M.D. Pager [phone] If no answer or after 5 PM call [phone]

## 2016-01-25 ENCOUNTER — Telehealth: Payer: Self-pay | Admitting: *Deleted

## 2016-01-25 NOTE — Telephone Encounter (Signed)
Transitional Care Post-Discharge Follow-Up Phone Call:   Admit date: 01/21/2016 Discharge date: 01/24/2016  Discharge Disposition: Home  Best patient contact number: [phone] Emergency contact(s): Mando Blatz (Sister) [phone] PCP: Juanito Doom  Principal Discharge Diagnosis: Anemia  Reason for Chronic Case Management: Co-morbidities as follows:  HTN, PVD, CAD, CHF, COPD, Hypothroidism, T2DM with stage 3 CKD; 5 ED 4 admissions in past 6 months  Post-discharge Communication: (Clearly document all attempts clearly and date contact made)   Call Completed: Yes, with patient and neighbor, Lelon Frohlich   Interpreter Needed: No   Please check all that apply:  ? Patient is caring for self at home.  X Patient has caregiver. If so, name and best contact number: Sister helps with bathing, [name] (paramedic) or Lelon Frohlich (neighbor) fill pill caddy ? Patient is knowledgeable of his/her condition(s) and/or treatment.  ? Family and/or caregiver is knowledgeable of patient's condition(s) and/or treatment.   Medication Reconciliation:  ? Medication list reviewed with patient.  X Patient has all discharge medications Lelon Frohlich states he has all discharge meds from [facility] pharmacy. Patient taking iron 325 mg bid with meals, Torsemide 20 mg 2 tabs bid. Patient has not been taking Imdur as it was listed on discharge instructions to stop. Per Dr. Nida Boatman note this was a mistake and patient should continue Imdur 60 mg daily. Lelon Frohlich will add to pill box and patient will begin tomorrow AM. Contacted [facility] at [phone] who stated patient has 3 refills of Imdur and some was delivered to patient yesterday.   ? Patient has O2/CPAP ordered? If so, name of company that supplies:  Activities of Daily Living:  ? Independent  X Needs assist (describe) Sister help with bathing and others help with med management as above ? Total Care (describe)   Community resources in place for patient:  X None  ? Home Health If so,  name of agency: ? [facility] If so, name of Care Manager and contact number:   ? Assisted Living  ? Hospice  ? Support Group    Topics discussed:   Identified Barriers: None at this time  Patient Education: Patient did not weigh himself today. Discussed importance of weighing daily and recording. Lelon Frohlich states patient has a machine that reminds him when to take meds and to weigh himself.  Questions/concerns: Patient states he's feeling, "alright." Lelon Frohlich states patient is eating better than after last discharge on 01/18/2016. Walking with cane. Lelon Frohlich reports that patient has had "some bouts with diarrhea and could use a bedside commode." Note sent to PCP regarding this. Reminded Ann of patient's [facility] appt with PCP on 02/01/2016 at Tomie China, RN, BSN

## 2016-01-25 NOTE — Telephone Encounter (Signed)
I do not know what Leonard [name] is or how to get in touch with them. Called patient and tried to clarify the request. Patient did not know anything about this and his sister is not available to talk at the moment. He will ask his sister to call back. Patient states he is doing well and happy to be home from the hospital. If patient's sister calls back please ask her for her phone # so I can get in touch with her. Thanks.

## 2016-01-25 NOTE — Telephone Encounter (Signed)
Patient states Dr. Rosalyn Gess called him yesterday and changed some of his meds but he didn't understand what he said and isnt sure what medications to take. Would like for MD to call him back

## 2016-01-25 NOTE — Telephone Encounter (Signed)
Called patient. He states that someone already called to clarify which medications he is taking. He does not have anymore questions at this time. He was appreciative of the call.   Smitty Cords, MD [place], PGY-2

## 2016-01-25 NOTE — Telephone Encounter (Signed)
I'm out of town and cannot call him right now.  There was some miscommunication about whether he should be taking his IMDUR.  He needs to keep taking this.  Could you please call the patient?  Thanks, Linna Hoff

## 2016-01-30 ENCOUNTER — Other Ambulatory Visit: Payer: Self-pay | Admitting: Family Medicine

## 2016-01-30 ENCOUNTER — Encounter (HOSPITAL_COMMUNITY): Payer: Self-pay | Admitting: *Deleted

## 2016-01-30 NOTE — Progress Notes (Signed)
Spoke with Zigmund Daniel at [place] office to make sure they were aware of pt recent hospitalization for sepsis. D/C on 01/24/16. They were aware and Dr.Field's saw pt on the 19th

## 2016-01-31 ENCOUNTER — Other Ambulatory Visit: Payer: Self-pay | Admitting: Hematology and Oncology

## 2016-01-31 ENCOUNTER — Ambulatory Visit (HOSPITAL_BASED_OUTPATIENT_CLINIC_OR_DEPARTMENT_OTHER): Payer: Medicaid Other

## 2016-01-31 ENCOUNTER — Other Ambulatory Visit (HOSPITAL_BASED_OUTPATIENT_CLINIC_OR_DEPARTMENT_OTHER): Payer: Medicaid Other

## 2016-01-31 ENCOUNTER — Encounter: Payer: Self-pay | Admitting: Internal Medicine

## 2016-01-31 ENCOUNTER — Ambulatory Visit (INDEPENDENT_AMBULATORY_CARE_PROVIDER_SITE_OTHER): Payer: Medicaid Other | Admitting: Internal Medicine

## 2016-01-31 VITALS — BP 131/59 | HR 72 | Temp 97.7°F | Resp 18

## 2016-01-31 VITALS — BP 140/68 | HR 74 | Ht 68.0 in | Wt 214.0 lb

## 2016-01-31 DIAGNOSIS — N184 Chronic kidney disease, stage 4 (severe): Principal | ICD-10-CM

## 2016-01-31 DIAGNOSIS — J449 Chronic obstructive pulmonary disease, unspecified: Secondary | ICD-10-CM

## 2016-01-31 DIAGNOSIS — D631 Anemia in chronic kidney disease: Secondary | ICD-10-CM

## 2016-01-31 DIAGNOSIS — N189 Chronic kidney disease, unspecified: Principal | ICD-10-CM

## 2016-01-31 LAB — CBC WITH DIFFERENTIAL/PLATELET
BASO%: 0.8 % (ref 0.0–2.0)
Basophils Absolute: 0 10*3/uL (ref 0.0–0.1)
EOS%: 4.9 % (ref 0.0–7.0)
Eosinophils Absolute: 0.3 10*3/uL (ref 0.0–0.5)
HCT: 29.4 % — ABNORMAL LOW (ref 38.4–49.9)
HGB: 9.3 g/dL — ABNORMAL LOW (ref 13.0–17.1)
LYMPH%: 21.5 % (ref 14.0–49.0)
MCH: 23.2 pg — ABNORMAL LOW (ref 27.2–33.4)
MCHC: 31.6 g/dL — AB (ref 32.0–36.0)
MCV: 73.5 fL — ABNORMAL LOW (ref 79.3–98.0)
MONO#: 0.5 10*3/uL (ref 0.1–0.9)
MONO%: 9.7 % (ref 0.0–14.0)
NEUT%: 63.1 % (ref 39.0–75.0)
NEUTROS ABS: 3.3 10*3/uL (ref 1.5–6.5)
PLATELETS: 188 10*3/uL (ref 140–400)
RBC: 4 10*6/uL — AB (ref 4.20–5.82)
RDW: 22.1 % — ABNORMAL HIGH (ref 11.0–14.6)
WBC: 5.3 10*3/uL (ref 4.0–10.3)
lymph#: 1.1 10*3/uL (ref 0.9–3.3)

## 2016-01-31 MED ORDER — TIOTROPIUM BROMIDE MONOHYDRATE 2.5 MCG/ACT IN AERS: 2.0000 | INHALATION_SPRAY | Freq: Every day | RESPIRATORY_TRACT | 11 refills | Status: DC

## 2016-01-31 MED ORDER — DARBEPOETIN ALFA 500 MCG/ML IJ SOSY
500.0000 ug | PREFILLED_SYRINGE | Freq: Once | INTRAMUSCULAR | Status: AC
  Administered 2016-01-31: 500 ug via SUBCUTANEOUS
  Filled 2016-01-31: qty 1

## 2016-01-31 MED ORDER — TIOTROPIUM BROMIDE MONOHYDRATE 2.5 MCG/ACT IN AERS: 2.0000 | INHALATION_SPRAY | Freq: Every day | RESPIRATORY_TRACT | 0 refills | Status: DC

## 2016-01-31 NOTE — Patient Instructions (Signed)
Plan A = Automatic = Spiriva 2 puffs each am    Plan B = Backup Only use your albuterol (PROVENTIL/yellow inhaler) as a rescue medication to be used if you can't catch your breath by resting or doing a relaxed purse lip breathing pattern.  - The less you use it, the better it will work when you need it. - Ok to use the inhaler up to 2 puffs  every 4 hours if you must but call for appointment if use goes up over your usual need - Don't leave home without it !!  (think of it like the spare tire for your car)    Please schedule a follow up office visit in 6 weeks, call sooner if needed with pfts

## 2016-01-31 NOTE — Progress Notes (Signed)
Subjective:     Patient ID: Leonard [name], male   DOB: 12-Nov-1950,    MRN: [id number]  HPI  81 yobm quit smoking 2014 and down hill since then s/p multiple admits for chf referred to pulmonary clinic 01/31/2016 by Dr   Juanito Doom in [facility] re COPE with GOLD III criteria 04/06/13    01/31/2016 1st [facility] office visit/ Leonard [name]   Chief Complaint  Patient presents with  . Pulmonary Consult    Referred by Hospitalist for eval of COPD. Pt denies any SOB today.    best days doe = MMRC2 = can't walk a nl pace on a flat grade s sob but does fine slow and flat eg shopping/ walking at mall  Min am cough and congestion worse in winter  No better on advair but not using consistently and technique very poor  No obvious day to day or daytime variability or assoc excess/ purulent sputum or mucus plugs or hemoptysis or cp or chest tightness, subjective wheeze or overt sinus or hb symptoms. No unusual exp hx or h/o childhood pna/ asthma or knowledge of premature birth.  Sleeping ok without nocturnal  or early am exacerbation  of respiratory  c/o's or need for noct saba. Also denies any obvious fluctuation of symptoms with weather or environmental changes or other aggravating or alleviating factors except as outlined above   Current Medications, Allergies, Complete Past Medical History, Past Surgical History, Family History, and Social History were reviewed in [employer] record.  ROS  The following are not active complaints unless bolded sore throat, dysphagia, dental problems, itching, sneezing,  nasal congestion or excess/ purulent secretions, ear ache,   fever, chills, sweats, unintended wt loss, classically pleuritic or exertional cp,  orthopnea pnd or leg swelling, presyncope, palpitations, abdominal pain, anorexia, nausea, vomiting, diarrhea  or change in bowel or bladder habits, change in stools or urine, dysuria,hematuria,  rash, arthralgias, visual complaints, headache,  numbness, weakness or ataxia or problems with walking or coordination,  change in mood/affect or memory.            Review of Systems     Objective:   Physical Exam     amb stoic bm nad / very shaky on details of care/meds     Wt Readings from Last 3 Encounters:  01/31/16 214 lb (97.1 kg)  01/24/16 202 lb 8 oz (91.9 kg)  01/18/16 215 lb 13.3 oz (97.9 kg)    Vital signs reviewed - Note on arrival 02 sats  99% on RA     HEENT: nl dentition, turbinates, and oropharynx. Nl external ear canals without cough reflex   NECK :  without JVD/Nodes/TM/ nl carotid upstrokes bilaterally   LUNGS: no acc muscle use,  slt barrel contour with distant bs, faint exp wheeze bilaterally    CV:  RRR  no s3 or murmur or increase in P2, nad no edema   ABD:  soft and nontender with nl inspiratory excursion in the supine position. No bruits or organomegaly appreciated, bowel sounds nl  MS:  Nl gait/ ext warm without deformities, calf tenderness, cyanosis or clubbing No obvious joint restrictions   SKIN: warm and dry without lesions    NEURO:  alert, approp, nl sensorium with  no motor or cerebellar deficits apparent.     I personally reviewed images and agree with radiology impression as follows:  CXR:   01/21/16 Cardiomegaly with mild to moderate diffuse pulmonary edema, suggesting acute CHF exacerbation.  Assessment:

## 2016-01-31 NOTE — Patient Instructions (Signed)
Darbepoetin Alfa injection What is this medicine? DARBEPOETIN ALFA (dar be POE e tin AL fa) helps your body make more red blood cells. It is used to treat anemia caused by chronic kidney failure and chemotherapy. This medicine may be used for other purposes; ask your health care provider or pharmacist if you have questions. What should I tell my health care provider before I take this medicine? They need to know if you have any of these conditions: -blood clotting disorders or history of blood clots -cancer patient not on chemotherapy -cystic fibrosis -heart disease, such as angina, heart failure, or a history of a heart attack -hemoglobin level of 12 g/dL or greater -high blood pressure -low levels of folate, iron, or vitamin B12 -seizures -an unusual or allergic reaction to darbepoetin, erythropoietin, albumin, hamster proteins, latex, other medicines, foods, dyes, or preservatives -pregnant or trying to get pregnant -breast-feeding How should I use this medicine? This medicine is for injection into a vein or under the skin. It is usually given by a health care professional in a hospital or clinic setting. If you get this medicine at home, you will be taught how to prepare and give this medicine. Do not shake the solution before you withdraw a dose. Use exactly as directed. Take your medicine at regular intervals. Do not take your medicine more often than directed. It is important that you put your used needles and syringes in a special sharps container. Do not put them in a trash can. If you do not have a sharps container, call your pharmacist or healthcare provider to get one. Talk to your pediatrician regarding the use of this medicine in children. While this medicine may be used in children as young as 1 year for selected conditions, precautions do apply. Overdosage: If you think you have taken too much of this medicine contact a poison control center or emergency room at once. NOTE:  This medicine is only for you. Do not share this medicine with others. What if I miss a dose? If you miss a dose, take it as soon as you can. If it is almost time for your next dose, take only that dose. Do not take double or extra doses. What may interact with this medicine? Do not take this medicine with any of the following medications: -epoetin alfa This list may not describe all possible interactions. Give your health care provider a list of all the medicines, herbs, non-prescription drugs, or dietary supplements you use. Also tell them if you smoke, drink alcohol, or use illegal drugs. Some items may interact with your medicine. What should I watch for while using this medicine? Visit your prescriber or health care professional for regular checks on your progress and for the needed blood tests and blood pressure measurements. It is especially important for the doctor to make sure your hemoglobin level is in the desired range, to limit the risk of potential side effects and to give you the best benefit. Keep all appointments for any recommended tests. Check your blood pressure as directed. Ask your doctor what your blood pressure should be and when you should contact him or her. As your body makes more red blood cells, you may need to take iron, folic acid, or vitamin B supplements. Ask your doctor or health care provider which products are right for you. If you have kidney disease continue dietary restrictions, even though this medication can make you feel better. Talk with your doctor or health care professional about the   foods you eat and the vitamins that you take. What side effects may I notice from receiving this medicine? Side effects that you should report to your doctor or health care professional as soon as possible: -allergic reactions like skin rash, itching or hives, swelling of the face, lips, or tongue -breathing problems -changes in vision -chest pain -confusion, trouble speaking  or understanding -feeling faint or lightheaded, falls -high blood pressure -muscle aches or pains -pain, swelling, warmth in the leg -rapid weight gain -severe headaches -sudden numbness or weakness of the face, arm or leg -trouble walking, dizziness, loss of balance or coordination -seizures (convulsions) -swelling of the ankles, feet, hands -unusually weak or tired Side effects that usually do not require medical attention (report to your doctor or health care professional if they continue or are bothersome): -diarrhea -fever, chills (flu-like symptoms) -headaches -nausea, vomiting -redness, stinging, or swelling at site where injected This list may not describe all possible side effects. Call your doctor for medical advice about side effects. You may report side effects to FDA at 1-800-FDA-1088. Where should I keep my medicine? Keep out of the reach of children. Store in a refrigerator between 2 and 8 degrees C (36 and 46 degrees F). Do not freeze. Do not shake. Throw away any unused portion if using a single-dose vial. Throw away any unused medicine after the expiration date. NOTE: This sheet is a summary. It may not cover all possible information. If you have questions about this medicine, talk to your doctor, pharmacist, or health care provider.    2016, [employer]. (2008-01-04 10:23:57)  

## 2016-01-31 NOTE — Progress Notes (Signed)
TRANSITION OF CARE VISIT   Primary Care Physician (PCP): [name], MD                                                     [facility]                                                   [place], [place] [address]                                                   [place]    Date of Admission: 01/21/2016  Date of Discharge: 01/24/2016  Discharged from: [facility]  Discharge Diagnosis:  Patient Active Problem List   Diagnosis Date Noted  . Acute encephalopathy 01/21/2016  . Dehydration 01/21/2016  . Fatigue 01/21/2016  . Urinary tract infection without hematuria 01/14/2016  . Hypothermia 01/14/2016  . Diabetic retinopathy ([place]) 11/13/2015  . Hypertensive urgency 11/07/2015  . Essential hypertension, benign 10/23/2015  . CHF (congestive heart failure) ([place]) 08/27/2015  . Shortness of breath 08/27/2015  . Iron deficiency anemia 08/03/2015  . Claudication ([place])   . Type 2 diabetes mellitus with stage 3 chronic kidney disease, with long-term current use of insulin ([place])   . Hematuria 06/27/2015  . Acute renal failure superimposed on stage 3 chronic kidney disease ([place])   . Knee pain   . Chest pain 06/03/2015  . Skin lesion of face 02/22/2015  . 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 Anemia 01/15/2015  . CKD (chronic kidney disease) stage 4, GFR 15-29 ml/min (HCC) 01/15/2015  . Hypothyroidism 11/22/2014  . COPD GOLD III  02/16/2014  . Chronic diastolic congestive heart failure ([place])   . COPD exacerbation ([place])   . History of tobacco use 08/23/2013  . 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 CAD (coronary artery disease) 04/06/2013  . PVD - hx of Rt SFA PTA and s/p Rt 4-5th toe amp Dec 2014 04/05/2013  . Atherosclerosis of native arteries of the extremities with gangrene ([place]) 02/15/2013  . Essential hypertension 11/25/2012  .  Mixed hyperlipidemia 07/17/2008     Summary of Admission:  Patient was brought to the ED for weakness and somnolence. No clear cause of his somnolence on lab however Hemoglobin was noted to be 7.7 in the ED as well as a positive FOBT. GI was consulted and offered an EGD and colonoscopy. Patient refused both and stated he would rather do it after Christmas. He was given Feraheme while inpatient. Nephrology also saw patient due to worsening chronic kidney disease. Vascular was consulted for AVF placement. Patient did not want AVF placed while inpatient but preferred after Christmas.   TODAY's VISIT  Patient/Caregiver self-reported problems/concerns:  None, patient is here at office alone. His niece is in the waiting room but he does not want her to come into the room  MEDICATIONS  Medication Reconciliation conducted with patient/caregiver? (Yes/ No): Attempted med reconciliation. Patient does not know the names of his medications. He brought his medications with him and they do match up with his medication list.   New medications prescribed/discontinued upon discharge? (Yes/No): Ferrous sulfate   Barriers identified related to medications: Patient compliance  LABS  Lab Reviewed (Yes/No/NA): Yes  PHYSICAL EXAM:  Vitals:   02/01/16 1606  BP: 134/68  Pulse: 79  Temp: 98.1 F (36.7 C)    Physical Exam  Constitutional: He is oriented to person, place, and time. He appears well-developed and well-nourished.  HENT:  Head: Normocephalic.  Right Ear: External ear normal.  Left Ear: External ear normal.  Nose: Nose normal.  Mouth/Throat: Oropharynx is clear and moist.  Eyes: Conjunctivae and EOM are normal. Pupils are equal, round, and reactive to light.  Neck: Normal range of motion.  Cardiovascular: Normal rate, regular rhythm and normal heart sounds.   Pulmonary/Chest: Effort normal and breath sounds normal. No respiratory distress. He has no wheezes.  Abdominal: Soft. Bowel sounds  are normal. He exhibits no distension. There is no tenderness.  Musculoskeletal: Normal range of motion. He exhibits edema (1+ pitting edema in bilateral ankles).  Neurological: He is alert and oriented to person, place, and time. He exhibits normal muscle tone.  Skin: Skin is warm. Capillary refill takes less than 2 seconds. No rash noted. No erythema.  Psychiatric: He has a normal mood and affect. His behavior is normal. Thought content normal.    ASSESSMENT: CHF (congestive heart failure) (HCC) Stable ankle edema and weight. No signs of CHF exacerbation at this time - Continue heart failure medications as prescribed - Follow up with cardiology - Encouraged patient to bring his sister with him at all future appointments  - Follow up in 1 month  Essential hypertension Controlled and at goal - Continue medications as prescribed  Anemia Iron deficiency anemia. Good response to ferrous sulfate. Hemoglobin up to 9.3 since discharge.  - Referral placed to GI for colonoscopy - continue iron supplementation - Recheck iron and hemoglobin in 1 month  CKD (chronic kidney disease) stage 4, GFR 15-29 ml/min (HCC) Seen while inpatient for AVF placement as kidney function worsening  - Close follow up with nephrology  - Follow up with [facility] [place] for AVF placement on 02/05/16    PATIENT EDUCATION PROVIDED: See AVS   FOLLOW-UP (Include any further testing or referrals):  GI referral placed Follow up with nephrology Follow up with Vascular & Vein specialist

## 2016-02-01 ENCOUNTER — Encounter: Payer: Self-pay | Admitting: Family Medicine

## 2016-02-01 ENCOUNTER — Ambulatory Visit (INDEPENDENT_AMBULATORY_CARE_PROVIDER_SITE_OTHER): Payer: Medicaid Other | Admitting: Family Medicine

## 2016-02-01 ENCOUNTER — Encounter: Payer: Self-pay | Admitting: Internal Medicine

## 2016-02-01 VITALS — BP 134/68 | HR 79 | Temp 98.1°F | Ht 68.0 in | Wt 211.4 lb

## 2016-02-01 DIAGNOSIS — I5032 Chronic diastolic (congestive) heart failure: Secondary | ICD-10-CM

## 2016-02-01 DIAGNOSIS — Z0000 Encounter for general adult medical examination without abnormal findings: Secondary | ICD-10-CM

## 2016-02-01 DIAGNOSIS — N184 Chronic kidney disease, stage 4 (severe): Secondary | ICD-10-CM

## 2016-02-01 DIAGNOSIS — I10 Essential (primary) hypertension: Secondary | ICD-10-CM

## 2016-02-01 DIAGNOSIS — D631 Anemia in chronic kidney disease: Secondary | ICD-10-CM | POA: Diagnosis not present

## 2016-02-01 DIAGNOSIS — D649 Anemia, unspecified: Secondary | ICD-10-CM | POA: Diagnosis not present

## 2016-02-01 NOTE — Progress Notes (Signed)
Report to Dr. Linna Caprice referencing the last visit pt. Had with Dr. Melvyn Novas.  Dr. Linna Caprice requests that after pt. sees Med. Clinic appt. today at [place].  Dr. Linna Caprice says this pt. Will be further reviewed by anesth. on DOS & do another CXR if needed based on pt.'s complaints.

## 2016-02-01 NOTE — Assessment & Plan Note (Signed)
Quit smoking 2014  - PFT's  04/06/13   FEV1 1.28 (44 % ) ratio 48  p no % improvement from saba p ?  prior to study with DLCO  43/54 % corrects to 97  % for alv volume - 01/31/2016  After extensive coaching HFA effectiveness =    75% from baseline 0 > changed to sprivia respimat    The differential diagnosis of difficult to control airways disorders is extensive with no quick and easy answers but easy to remember because it consists of 13 A's,  Two Bs and one C: 1. Adherence, always a challenge and the leading suspect here  - see training on hfa/ really struggles, some better with respimat but will need to return in 6 weeks with all meds in hand using a trust but verify approach to confirm accurate Medication  Reconciliation The principal here is that until we are certain that the  patients are doing what we've asked, it makes no sense to ask them to do more.  2. Acid reflux disease, with the greater proportion of pulmonary patients with no overt heartburn symptoms, and no easy way to treat non-acid reflux> rec continue ppi daily ac  3. Ace inhibitor use, the side effects of which give  even experienced pulmonologists a challenge sorting out (it's not all about the dry cough, though if cough is present ace's will need to be stopped for at least a month to tease out this component) 4. Active sinus dz, best addressed by a sinus ct 5. Active smoking,  Usually sureptitious in this setting> denies since 2014  6. Allergic diseases, usually with a hx dating back to childhood with prominent allergic rhinitis features in up 90% of pts and absent here, so no w/u for now 7. Aspiration, a perennial problem in the elderly or other patients at risk 8. 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 Alpha one Antitrypsin deficiency, a must screen in patients with chronic airflow obstruction syndromes out of proportion to smoking history> but very rare in african americans  10.  Adverse effect of inhalers, especially DPI's and especially with poor inhaler technique> d/c Advair 11 Anxiety, always a diagnosis of exclusion 12. A bunch of PE's ie moderately large clot burden, a few small ones peripherally can cause pleuritic cp syndromes but not unexplained dyspnea 13 Anemia or Thyroid disorders, easily excluded with standard labs but frequently overlooked in the chronically symptomatic/ frequent return pt.     Lab Results  Component Value Date   HGB 9.3 (L) 01/31/2016   HGB 8.6 (L) 01/24/2016   HGB 8.3 (L) 01/23/2016   HGB 7.8 (L) 01/22/2016   HGB 9.0 (L) 01/03/2016   HGB 8.5 (L) 12/20/2015   f/u with hematology planned  Two B's 1. Bronchiectasis:  Pos CT is the sine que non here 2  Beta blocker effects:  Coreg and Timolol use are pervasive in the adult population and both have significant spillover effects on the airways > note on relatively high doses of corgard so def a concern here. LAMA's don't get blocked by BB so start with spiriva but ideally Strongly prefer in this setting: Bystolic, the most beta -1  selective Beta blocker available in sample form, with bisoprolol the most selective generic choice  on the market.  One C 1. Congestive heart failure,easily  ruled out now with BNP level of < 100 when symptomatic  Lab Results  Component Value Date   PROBNP 2,795.0 (H) 2020-06-2213  Last BNP  224  12/617     F/u with pfts in 6 weeks  Total time devoted to counseling  > 50 % of 60 plus min office visit:  review case with pt/family discussion of options/alternatives/ personally creating written customized instructions  in presence of pt  then going over those specific  Instructions directly with the pt including how to use all of the meds but in particular covering each new medication in detail and the difference between the maintenance/automatic meds and the prns using an action plan format for the latter.  Please see AVS from this visit for a full list of these  instructions

## 2016-02-01 NOTE — Patient Instructions (Signed)
Thank you for coming in today, it was so nice to see you! Today we talked about:    You are doing well! Please continue taking your medications as prescribed. Please bring a relative with you to your next visit.  Please follow up in 1 month. You can schedule this appointment at the front desk before you leave or call the clinic.  Bring in all your medications or supplements to each appointment for review.   If you have any questions or concerns, please do not hesitate to call the office at [phone]. You can also message me directly via MyChart.   Sincerely,  Smitty Cords, MD

## 2016-02-05 NOTE — Assessment & Plan Note (Signed)
Controlled and at goal - Continue medications as prescribed

## 2016-02-05 NOTE — Assessment & Plan Note (Signed)
Seen while inpatient for AVF placement as kidney function worsening  - Close follow up with nephrology  - Follow up with [facility] [place] for AVF placement on 02/05/16

## 2016-02-05 NOTE — Assessment & Plan Note (Addendum)
Stable ankle edema and weight. No signs of CHF exacerbation at this time - Continue heart failure medications as prescribed - Follow up with cardiology - Encouraged patient to bring his sister with him at all future appointments  - Follow up in 1 month

## 2016-02-05 NOTE — Assessment & Plan Note (Addendum)
Iron deficiency anemia. Good response to ferrous sulfate. Hemoglobin up to 9.3 since discharge.  - Referral placed to GI for colonoscopy - continue iron supplementation - Recheck iron and hemoglobin in 1 month

## 2016-02-06 ENCOUNTER — Telehealth (HOSPITAL_COMMUNITY): Payer: Self-pay | Admitting: *Deleted

## 2016-02-06 NOTE — Telephone Encounter (Signed)
[name] called to report while she was visiting patient his BP was elevated at 152/66, 2 lb weight gain from yesterday, and pitting edema in BLE with left being a little worse than the right.  Patient is not complaining of any shortness of breath and confirms he is taking all of his prescribed meds correctly.  [name] will continue to monitor and report any findings.  Patient is scheduled for a follow up appointment this month.

## 2016-02-07 ENCOUNTER — Other Ambulatory Visit: Payer: Self-pay | Admitting: *Deleted

## 2016-02-08 ENCOUNTER — Encounter (HOSPITAL_COMMUNITY): Payer: Medicaid Other

## 2016-02-08 ENCOUNTER — Telehealth: Payer: Self-pay | Admitting: Family Medicine

## 2016-02-08 ENCOUNTER — Encounter (HOSPITAL_COMMUNITY): Payer: Self-pay | Admitting: *Deleted

## 2016-02-08 MED ORDER — INSULIN PEN NEEDLE 31G X 8 MM MISC: 5 refills | Status: DC

## 2016-02-08 NOTE — Telephone Encounter (Signed)
Pt has put on 7 pounds since Monday. 4 pound increase since yesterday. Leonard [name] wanted to make [place] aware, pt reported no symptoms. ep

## 2016-02-08 NOTE — Progress Notes (Signed)
Spoke with pt for pre-op call. Pt has CAD with CABG in the past. Denies any recent chest pain. States he has sob most of the time, no worse than his normal. Pt is diabetic. Doesn't know when or what his last A1C was. States fasting blood sugar is usually around 200. Instructed pt to take 1/2 of his regular dose of Lantus Insulin Sunday PM (will take 15 units). Instructed pt not to take Novolog Insulin the morning of surgery. Instructed pt to check blood sugar morning of surgery. If blood sugar is 70 or below, treat with 1/2 cup of clear juice (apple or cranberry) and recheck blood sugar 15 minutes after drinking juice. If blood sugar continues to be 70 or below, call the Short Stay department and ask to speak to a nurse.

## 2016-02-08 NOTE — Telephone Encounter (Signed)
Will forward to PCP.  Danford Tat L, RN  

## 2016-02-08 NOTE — Telephone Encounter (Signed)
Pt would like a referral to get a colonoscopy. ep

## 2016-02-10 MED ORDER — DEXTROSE 5 % IV SOLN
1.5000 g | INTRAVENOUS | Status: AC
  Administered 2016-02-11: 1.5 g via INTRAVENOUS
  Filled 2016-02-10: qty 1.5

## 2016-02-11 ENCOUNTER — Ambulatory Visit (HOSPITAL_COMMUNITY): Payer: Medicaid Other | Admitting: Vascular Surgery

## 2016-02-11 ENCOUNTER — Ambulatory Visit (HOSPITAL_COMMUNITY)
Admission: RE | Admit: 2016-02-11 | Discharge: 2016-02-11 | Disposition: A | Discharge status: Home / Self Care | Payer: Medicaid Other | Source: Ambulatory Visit | Attending: Vascular Surgery | Admitting: Vascular Surgery

## 2016-02-11 ENCOUNTER — Encounter (HOSPITAL_COMMUNITY): Payer: Self-pay | Admitting: *Deleted

## 2016-02-11 ENCOUNTER — Encounter (HOSPITAL_COMMUNITY)
Admission: RE | Disposition: A | Discharge status: Home / Self Care | Payer: Self-pay | Source: Ambulatory Visit | Attending: Vascular Surgery

## 2016-02-11 DIAGNOSIS — Z87891 Personal history of nicotine dependence: Secondary | ICD-10-CM | POA: Insufficient documentation

## 2016-02-11 DIAGNOSIS — Z89421 Acquired absence of other right toe(s): Secondary | ICD-10-CM | POA: Insufficient documentation

## 2016-02-11 DIAGNOSIS — I132 Hypertensive heart and chronic kidney disease with heart failure and with stage 5 chronic kidney disease, or end stage renal disease: Secondary | ICD-10-CM | POA: Insufficient documentation

## 2016-02-11 DIAGNOSIS — Z951 Presence of aortocoronary bypass graft: Secondary | ICD-10-CM | POA: Insufficient documentation

## 2016-02-11 DIAGNOSIS — N185 Chronic kidney disease, stage 5: Secondary | ICD-10-CM | POA: Diagnosis not present

## 2016-02-11 DIAGNOSIS — Z7982 Long term (current) use of aspirin: Secondary | ICD-10-CM | POA: Insufficient documentation

## 2016-02-11 DIAGNOSIS — N186 End stage renal disease: Secondary | ICD-10-CM | POA: Insufficient documentation

## 2016-02-11 DIAGNOSIS — Z85828 Personal history of other malignant neoplasm of skin: Secondary | ICD-10-CM | POA: Diagnosis not present

## 2016-02-11 DIAGNOSIS — I10 Essential (primary) hypertension: Secondary | ICD-10-CM

## 2016-02-11 DIAGNOSIS — Z794 Long term (current) use of insulin: Secondary | ICD-10-CM | POA: Diagnosis not present

## 2016-02-11 DIAGNOSIS — E7800 Pure hypercholesterolemia, unspecified: Secondary | ICD-10-CM | POA: Diagnosis not present

## 2016-02-11 DIAGNOSIS — I509 Heart failure, unspecified: Secondary | ICD-10-CM | POA: Insufficient documentation

## 2016-02-11 DIAGNOSIS — E1122 Type 2 diabetes mellitus with diabetic chronic kidney disease: Secondary | ICD-10-CM | POA: Diagnosis not present

## 2016-02-11 DIAGNOSIS — E1121 Type 2 diabetes mellitus with diabetic nephropathy: Secondary | ICD-10-CM

## 2016-02-11 DIAGNOSIS — E785 Hyperlipidemia, unspecified: Secondary | ICD-10-CM

## 2016-02-11 DIAGNOSIS — I2510 Atherosclerotic heart disease of native coronary artery without angina pectoris: Secondary | ICD-10-CM | POA: Diagnosis not present

## 2016-02-11 HISTORY — PX: AV FISTULA PLACEMENT: SHX1204

## 2016-02-11 LAB — GLUCOSE, CAPILLARY
GLUCOSE-CAPILLARY: 211 mg/dL — AB (ref 65–99)
Glucose-Capillary: 207 mg/dL — ABNORMAL HIGH (ref 65–99)

## 2016-02-11 LAB — POCT I-STAT 4, (NA,K, GLUC, HGB,HCT)
Glucose, Bld: 219 mg/dL — ABNORMAL HIGH (ref 65–99)
HCT: 30 % — ABNORMAL LOW (ref 39.0–52.0)
Hemoglobin: 10.2 g/dL — ABNORMAL LOW (ref 13.0–17.0)
POTASSIUM: 4.1 mmol/L (ref 3.5–5.1)
SODIUM: 141 mmol/L (ref 135–145)

## 2016-02-11 SURGERY — ARTERIOVENOUS (AV) FISTULA CREATION
Anesthesia: General | Site: Arm Upper | Laterality: Left

## 2016-02-11 MED ORDER — LIDOCAINE HCL (PF) 1 % IJ SOLN
INTRAMUSCULAR | Status: DC | PRN
  Administered 2016-02-11: 30 mL

## 2016-02-11 MED ORDER — OXYCODONE HCL 5 MG PO TABS: 5.0000 mg | ORAL_TABLET | Freq: Once | ORAL | Status: DC | PRN

## 2016-02-11 MED ORDER — SODIUM CHLORIDE 0.9 % IV SOLN: INTRAVENOUS | Status: DC

## 2016-02-11 MED ORDER — 0.9 % SODIUM CHLORIDE (POUR BTL) OPTIME
TOPICAL | Status: DC | PRN
  Administered 2016-02-11: 1000 mL

## 2016-02-11 MED ORDER — OXYCODONE-ACETAMINOPHEN 5-325 MG PO TABS: 1.0000 | ORAL_TABLET | Freq: Four times a day (QID) | ORAL | 0 refills | Status: DC | PRN

## 2016-02-11 MED ORDER — INSULIN ASPART 100 UNIT/ML FLEXPEN: 20.0000 [IU] | PEN_INJECTOR | Freq: Every day | SUBCUTANEOUS | Status: DC

## 2016-02-11 MED ORDER — PROPOFOL 10 MG/ML IV BOLUS
INTRAVENOUS | Status: AC
  Filled 2016-02-11: qty 20

## 2016-02-11 MED ORDER — TORSEMIDE 20 MG PO TABS: 40.0000 mg | ORAL_TABLET | ORAL | Status: DC

## 2016-02-11 MED ORDER — LIDOCAINE HCL (PF) 1 % IJ SOLN
INTRAMUSCULAR | Status: AC
  Filled 2016-02-11: qty 30

## 2016-02-11 MED ORDER — LIDOCAINE 2% (20 MG/ML) 5 ML SYRINGE
INTRAMUSCULAR | Status: AC
  Filled 2016-02-11: qty 5

## 2016-02-11 MED ORDER — ONDANSETRON HCL 4 MG/2ML IJ SOLN
INTRAMUSCULAR | Status: DC | PRN
  Administered 2016-02-11: 4 mg via INTRAVENOUS

## 2016-02-11 MED ORDER — ONDANSETRON HCL 4 MG/2ML IJ SOLN
INTRAMUSCULAR | Status: AC
  Filled 2016-02-11: qty 2

## 2016-02-11 MED ORDER — ONDANSETRON HCL 4 MG/2ML IJ SOLN
4.0000 mg | Freq: Once | INTRAMUSCULAR | Status: DC | PRN
Start: 2016-02-11 — End: 2016-02-11

## 2016-02-11 MED ORDER — FENTANYL CITRATE (PF) 100 MCG/2ML IJ SOLN
INTRAMUSCULAR | Status: DC | PRN
  Administered 2016-02-11: 25 ug via INTRAVENOUS

## 2016-02-11 MED ORDER — FENTANYL CITRATE (PF) 100 MCG/2ML IJ SOLN
INTRAMUSCULAR | Status: AC
  Filled 2016-02-11: qty 2

## 2016-02-11 MED ORDER — CHLORHEXIDINE GLUCONATE CLOTH 2 % EX PADS: 6.0000 | MEDICATED_PAD | Freq: Once | CUTANEOUS | Status: DC

## 2016-02-11 MED ORDER — OXYCODONE HCL 5 MG/5ML PO SOLN: 5.0000 mg | Freq: Once | ORAL | Status: DC | PRN

## 2016-02-11 MED ORDER — ASPIRIN 81 MG PO TABS: ORAL_TABLET | ORAL | 3 refills | Status: DC

## 2016-02-11 MED ORDER — FENTANYL CITRATE (PF) 100 MCG/2ML IJ SOLN: 25.0000 ug | INTRAMUSCULAR | Status: DC | PRN

## 2016-02-11 MED ORDER — SODIUM CHLORIDE 0.9 % IV SOLN
INTRAVENOUS | Status: DC | PRN
  Administered 2016-02-11: 08:00:00

## 2016-02-11 MED ORDER — LIDOCAINE 2% (20 MG/ML) 5 ML SYRINGE
INTRAMUSCULAR | Status: DC | PRN
  Administered 2016-02-11: 40 mg via INTRAVENOUS

## 2016-02-11 MED ORDER — ONDANSETRON HCL 4 MG/2ML IJ SOLN: 4.0000 mg | Freq: Once | INTRAMUSCULAR | Status: DC | PRN

## 2016-02-11 MED ORDER — MIDAZOLAM HCL 2 MG/2ML IJ SOLN
INTRAMUSCULAR | Status: DC | PRN
  Administered 2016-02-11: 1 mg via INTRAVENOUS

## 2016-02-11 MED ORDER — MIDAZOLAM HCL 2 MG/2ML IJ SOLN
INTRAMUSCULAR | Status: AC
  Filled 2016-02-11: qty 2

## 2016-02-11 MED ORDER — SODIUM CHLORIDE 0.9 % IV SOLN
INTRAVENOUS | Status: DC | PRN
  Administered 2016-02-11: 07:00:00 via INTRAVENOUS

## 2016-02-11 MED ORDER — PROPOFOL 10 MG/ML IV BOLUS
INTRAVENOUS | Status: DC | PRN
  Administered 2016-02-11: 50 mg via INTRAVENOUS
  Administered 2016-02-11: 120 mg via INTRAVENOUS

## 2016-02-11 SURGICAL SUPPLY — 37 items
ADH SKN CLS APL DERMABOND .7 (GAUZE/BANDAGES/DRESSINGS) ×1
AGENT HMST SPONGE THK3/8 (HEMOSTASIS)
ARMBAND PINK RESTRICT EXTREMIT (MISCELLANEOUS) ×4 IMPLANT
CANISTER SUCTION 2500CC (MISCELLANEOUS) ×3 IMPLANT
CLIP TI MEDIUM 6 (CLIP) ×3 IMPLANT
CLIP TI WIDE RED SMALL 6 (CLIP) ×3 IMPLANT
COVER PROBE W GEL 5X96 (DRAPES) ×3 IMPLANT
DECANTER SPIKE VIAL GLASS SM (MISCELLANEOUS) ×1 IMPLANT
DERMABOND ADVANCED (GAUZE/BANDAGES/DRESSINGS) ×2
DERMABOND ADVANCED .7 DNX12 (GAUZE/BANDAGES/DRESSINGS) ×1 IMPLANT
ELECT REM PT RETURN 9FT ADLT (ELECTROSURGICAL) ×3
ELECTRODE REM PT RTRN 9FT ADLT (ELECTROSURGICAL) ×1 IMPLANT
GLOVE BIO SURGEON STRL SZ 6.5 (GLOVE) ×1 IMPLANT
GLOVE BIO SURGEON STRL SZ7 (GLOVE) ×3 IMPLANT
GLOVE BIO SURGEONS STRL SZ 6.5 (GLOVE) ×1
GLOVE BIOGEL PI IND STRL 6.5 (GLOVE) IMPLANT
GLOVE BIOGEL PI IND STRL 7.0 (GLOVE) IMPLANT
GLOVE BIOGEL PI IND STRL 7.5 (GLOVE) ×1 IMPLANT
GLOVE BIOGEL PI INDICATOR 6.5 (GLOVE) ×8
GLOVE BIOGEL PI INDICATOR 7.0 (GLOVE) ×8
GLOVE BIOGEL PI INDICATOR 7.5 (GLOVE) ×2
GLOVE SURG SS PI 6.5 STRL IVOR (GLOVE) ×4 IMPLANT
GOWN STRL REUS W/ TWL LRG LVL3 (GOWN DISPOSABLE) ×3 IMPLANT
GOWN STRL REUS W/TWL LRG LVL3 (GOWN DISPOSABLE) ×15
HEMOSTAT SPONGE AVITENE ULTRA (HEMOSTASIS) IMPLANT
KIT BASIN OR (CUSTOM PROCEDURE TRAY) ×3 IMPLANT
KIT ROOM TURNOVER OR (KITS) ×3 IMPLANT
NS IRRIG 1000ML POUR BTL (IV SOLUTION) ×3 IMPLANT
PACK CV ACCESS (CUSTOM PROCEDURE TRAY) ×3 IMPLANT
PAD ARMBOARD 7.5X6 YLW CONV (MISCELLANEOUS) ×6 IMPLANT
SUT MNCRL AB 4-0 PS2 18 (SUTURE) ×3 IMPLANT
SUT PROLENE 6 0 BV (SUTURE) ×4 IMPLANT
SUT PROLENE 7 0 BV 1 (SUTURE) ×3 IMPLANT
SUT VIC AB 3-0 SH 27 (SUTURE) ×3
SUT VIC AB 3-0 SH 27X BRD (SUTURE) ×1 IMPLANT
UNDERPAD 30X30 (UNDERPADS AND DIAPERS) ×3 IMPLANT
WATER STERILE IRR 1000ML POUR (IV SOLUTION) ×3 IMPLANT

## 2016-02-11 NOTE — Anesthesia Preprocedure Evaluation (Signed)
Anesthesia Evaluation  Patient identified by MRN, date of birth, ID band Patient awake    Reviewed: Allergy & Precautions, NPO status , Patient's Chart, lab work & pertinent test results  Airway Mallampati: II  TM Distance: >3 FB     Dental  (+) Edentulous Upper, Edentulous Lower   Pulmonary former smoker,    breath sounds clear to auscultation       Cardiovascular hypertension,  Rhythm:Regular Rate:Normal     Neuro/Psych    GI/Hepatic   Endo/Other  diabetes  Renal/GU      Musculoskeletal   Abdominal   Peds  Hematology   Anesthesia Other Findings   Reproductive/Obstetrics                             Anesthesia Physical Anesthesia Plan  ASA: III  Anesthesia Plan: General   Post-op Pain Management:    Induction: Intravenous  Airway Management Planned: LMA  Additional Equipment:   Intra-op Plan:   Post-operative Plan:   Informed Consent: I have reviewed the patients History and Physical, chart, labs and discussed the procedure including the risks, benefits and alternatives for the proposed anesthesia with the patient or authorized representative who has indicated his/her understanding and acceptance.     Plan Discussed with: CRNA and Anesthesiologist  Anesthesia Plan Comments:         Anesthesia Quick Evaluation

## 2016-02-11 NOTE — Anesthesia Procedure Notes (Signed)
Procedure Name: LMA Insertion Date/Time: 02/11/2016 7:39 AM Performed by: Garrison Columbus T Pre-anesthesia Checklist: Patient identified, Emergency Drugs available, Suction available and Patient being monitored Patient Re-evaluated:Patient Re-evaluated prior to inductionOxygen Delivery Method: Circle System Utilized Preoxygenation: Pre-oxygenation with 100% oxygen Intubation Type: IV induction Ventilation: Mask ventilation without difficulty LMA: LMA inserted LMA Size: 5.0 Number of attempts: 1 Airway Equipment and Method: Bite block Placement Confirmation: positive ETCO2 Tube secured with: Tape Dental Injury: Teeth and Oropharynx as per pre-operative assessment  Comments: LMA placed by Carver Fila, SRNA

## 2016-02-11 NOTE — Transfer of Care (Signed)
Immediate Anesthesia Transfer of Care Note  Patient: Leonard [name]  Procedure(s) Performed: Procedure(s): LEFT UPPER ARM BRACHIAL CEPHALIC ARTERIOVENOUS (AV) FISTULA CREATION (Left)  Patient Location: PACU  Anesthesia Type:General  Level of Consciousness: awake and alert   Airway & Oxygen Therapy: Patient Spontanous Breathing  Post-op Assessment: Report given to RN, Post -op Vital signs reviewed and stable and Patient moving all extremities X 4  Post vital signs: Reviewed and stable  Last Vitals:  Vitals:   02/11/16 0617  BP: (!) 150/60  Pulse: 71  Resp: 20  Temp: 36.9 C    Last Pain:  Vitals:   02/11/16 0617  TempSrc: Oral      Patients Stated Pain Goal: 4 (16/10/96 0454)  Complications: No apparent anesthesia complications

## 2016-02-11 NOTE — Telephone Encounter (Signed)
This referral has already been placed in the beginning on January. Per Epic, patient was called on 1/2 to schedule an appt and he said he would call back once he looked at his calender. [facility] team please call patient and inform him to call [facility] to schedule his appointment.   Smitty Cords, MD [place], PGY-2

## 2016-02-11 NOTE — Telephone Encounter (Signed)
Tried calling anne back, no answer. If she calls back please ask her to continue to monitor patient closely and be on alert for any CHF symptoms shortness of breath, edema, etc. Patient is being seen today by vascular specialist.

## 2016-02-11 NOTE — Anesthesia Postprocedure Evaluation (Signed)
Anesthesia Post Note  Patient: Leonard [name]  Procedure(s) Performed: Procedure(s) (LRB): LEFT UPPER ARM BRACHIAL CEPHALIC ARTERIOVENOUS (AV) FISTULA CREATION (Left)  Anesthesia Type: General Level of consciousness: awake, awake and alert and oriented Pain management: pain level controlled Respiratory status: spontaneous breathing, nonlabored ventilation and respiratory function stable Cardiovascular status: blood pressure returned to baseline Anesthetic complications: no       Last Vitals:  Vitals:   02/11/16 0941 02/11/16 0950  BP: 140/63 140/61  Pulse: 67   Resp: 18   Temp:      Last Pain:  Vitals:   02/11/16 0617  TempSrc: Oral                 Laurelai Lepp COKER

## 2016-02-11 NOTE — H&P (Signed)
Brief History and Physical  History of Present Illness  Leonard [name] is a 66 y.o. male who presents with chief complaint: imminent ESRD.  The patient presents today for L RC vs BC AVF.    Past Medical History:  Diagnosis Date  . Cancer ([place])    skin cancer-  . CHF (congestive heart failure) ([place])   . Chronic kidney disease    not on dialysis  . Coronary artery disease   . Gangrene of toe ([place])    right 5th/notes 01/04/2013   . High cholesterol   . Hypertension   . Iron deficiency anemia 08/03/2015  . 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 PAD (peripheral artery disease) ([place])   . Shortness of breath dyspnea    with exertion  . Type II diabetes mellitus ([place])    Type II    Past Surgical History:  Procedure Laterality Date  . AMPUTATION Right 01/28/2013   Procedure: RAY AMPUTATION RIGHT  4th & 5th TOE;  Surgeon: Rosetta Posner, MD;  Location: [facility];  Service: Vascular;  Laterality: Right;  . ANGIOPLASTY / STENTING FEMORAL Right 01/13/2013   superficial femoral artery x 2 (3 mm x 60 mm, 4 mm x 60 mm)  . COLONOSCOPY W/ POLYPECTOMY    . CORONARY ARTERY BYPASS GRAFT N/A 04/07/2013   Procedure: CORONARY ARTERY BYPASS GRAFTING (CABG);  Surgeon: Ivin Poot, MD;  Location: [place];  Service: Open Heart Surgery;  Laterality: N/A;  . INTRAOPERATIVE TRANSESOPHAGEAL ECHOCARDIOGRAM N/A 04/07/2013   Procedure: INTRAOPERATIVE TRANSESOPHAGEAL ECHOCARDIOGRAM;  Surgeon: Ivin Poot, MD;  Location: [place];  Service: Open Heart Surgery;  Laterality: N/A;  . LEFT HEART CATHETERIZATION WITH CORONARY ANGIOGRAM N/A 04/05/2013   Procedure: LEFT HEART CATHETERIZATION WITH CORONARY ANGIOGRAM;  Surgeon: Peter M Martinique, MD;  Location[facility] C[facility] LAB;  Service: Cardiovascular;  Laterality: N/A;  . LOWER EXTREMITY ANGIOGRAM Bilateral 01/06/2013   Procedure: LOWER EXTREMITY ANGIOGRAM;  Surgeon: Conrad Dos Palos, MD;  Location: [facility] CATH LAB;  Service: Cardiovascular;  Laterality:  Bilateral;  . LOWER EXTREMITY ANGIOGRAM Right 01/13/2013   Procedure: LOWER EXTREMITY ANGIOGRAM;  Surgeon: Conrad Moberly, MD;  Location: [facility] CATH LAB;  Service: Cardiovascular;  Laterality: Right;  . SKIN CANCER EXCISION  2017  . THROAT SURGERY  1983   polyps  . TONSILLECTOMY AND ADENOIDECTOMY  ~ 62    Social History   Social History  . Marital status: Single    Spouse name: N/A  . Number of children: N/A  . Years of education: N/A   Occupational History  . Not on file.   Social History Main Topics  . Smoking status: Former Smoker    Packs/day: 1.00    Years: 46.00    Types: Cigarettes    Start date: 02/04/1967    Quit date: 02/03/2013  . Smokeless tobacco: Never Used     Comment: Doing well with quitting.  . Alcohol use No  . Drug use: No  . Sexual activity: Not Currently   Other Topics Concern  . Not on file   Social History Narrative   Lives with sister, [name] in [place].  Retired - former Sports coach.    Family History  Problem Relation Age of Onset  . Cancer Mother     lung cancer  . Hypertension Mother   . Cancer Sister     patient thinks it was uterine cancer  . Hypertension Sister   . Heart attack  Sister   . Stroke Neg Hx     No current facility-administered medications on file prior to encounter.    Current Outpatient Prescriptions on File Prior to Encounter  Medication Sig Dispense Refill  . amLODipine (NORVASC) 10 MG tablet Take 10 mg by mouth daily.    Marland Kitchen aspirin 81 MG tablet TAKE ONE (1) TABLET BY MOUTH EVERY DAY (Patient taking differently: TAKE 81 MG BY MOUTH EVERY DAY) 30 tablet 3  . atorvastatin (LIPITOR) 40 MG tablet TAKE 1 TABLET BY MOUTH EVERY DAY AT 6 PM (Patient taking differently: Take 40 mg by mouth daily at 6 PM. ) 90 tablet 3  . carvedilol (COREG) 25 MG tablet Take 2 tablets (50 mg total) by mouth 2 (two) times daily with a meal. 120 tablet 3  . doxazosin (CARDURA) 8 MG tablet Take 1 tablet (8 mg total) by mouth 2 (two) times  daily. 60 tablet 6  . ferrous sulfate (FERROUSUL) 325 (65 FE) MG tablet Take 1 tablet (325 mg total) by mouth 2 (two) times daily with a meal. 60 tablet 3  . hydrALAZINE (APRESOLINE) 100 MG tablet TAKE ONE TABLET BY MOUTH EVERY EIGHT HOURS (Patient taking differently: Take 100 mg by mouth every 8 (eight) hours. ) 90 tablet 3  . insulin aspart (NOVOLOG FLEXPEN) 100 UNIT/ML FlexPen Inject 20 Units into the skin 3 (three) times daily with meals. (Patient taking differently: Inject 20 Units into the skin daily. ) 15 mL 3  . Insulin Glargine (LANTUS SOLOSTAR) 100 UNIT/ML Solostar Pen Inject 30-60 Units into the skin at bedtime. Per BS count    . levothyroxine (SYNTHROID, LEVOTHROID) 50 MCG tablet TAKE ONE (1) TABLET BY MOUTH EVERY DAY BEFORE BREAKFAST 30 tablet 3  . NIFEdipine (PROCARDIA XL/ADALAT-CC) 60 MG 24 hr tablet Take 2 tablets (120 mg total) by mouth daily. 60 tablet 3  . pantoprazole (PROTONIX) 40 MG tablet TAKE 1 TABLET BY MOUTH DAILY (Patient taking differently: TAKE 40 MG BY MOUTH DAILY) 30 tablet 3  . potassium chloride SA (K-DUR,KLOR-CON) 20 MEQ tablet Take 63meq (1 tablet ) in the AM and 80meq (1/2 tablet) in the PM (Patient taking differently: Take 10-20 mEq by mouth Leonard admin instructions. Take 36meq (1 tablet ) in the AM and 20meq (1/2 tablet) in the PM) 135 tablet 3  . PROVENTIL HFA 108 (90 Base) MCG/ACT inhaler INHALE 2 PUFFS INTO THE LUNGS EVERY 6 HOURS AS NEEDED FOR WHEEZING ORSHORTNESS OF BREATH 6.7 g 3  . torsemide (DEMADEX) 20 MG tablet Take 2 tablets (40 mg total) by mouth 2 (two) times daily. (Patient taking differently: Take 40-60 mg by mouth Leonard admin instructions. Take 60 mg by mouth in the morning and take 40 mg by mouth in the evening) 150 tablet 3  . nitroGLYCERIN (NITROSTAT) 0.4 MG SL tablet Place 1 tablet (0.4 mg total) under the tongue every 5 (five) minutes as needed for chest pain. 30 tablet 12    Allergies  Allergen Reactions  . No Known Allergies     Review of  Systems: As listed above, otherwise negative.  Physical Examination  Vitals:   01/30/16 1507 02/11/16 0617 02/11/16 0629  BP:  (!) 150/60   Pulse:  71   Resp:  20   Temp:  98.5 F (36.9 C)   TempSrc:  Oral   SpO2:  100%   Weight: 202 lb (91.6 kg)  211 lb (95.7 kg)  Height: 5\' 8"  (1.727 m) 5\' 8"  (1.727 m)  General: A&O x 3, WDWN  Pulmonary: Sym exp, good air movt, CTAB, no rales, rhonchi, & wheezing  Cardiac: RRR, Nl S1, S2, no Murmurs, rubs or gallops  Gastrointestinal: soft, NTND, -G/R, - HSM, - masses, - CVAT B  Musculoskeletal: M/S 5/5 throughout , Extremities without ischemic changes   Laboratory Leonard [name] is a 66 y.o. male who presents with: imminent ESRD.   The patient is scheduled for: L RC vs BC AVF  Risk, benefits, and alternatives to access surgery were discussed.  The patient is aware the risks include but are not limited to: bleeding, infection, steal syndrome, nerve damage, ischemic monomelic neuropathy, failure to mature, and need for additional procedures.  The patient is aware of the risks and agrees to proceed.  Adele Barthel, MD [facility] Office: [phone] Pager: [phone]  02/11/2016, 7:20 AM

## 2016-02-11 NOTE — Op Note (Signed)
OPERATIVE NOTE   PROCEDURE: left brachiocephalic arteriovenous fistula placement  PRE-OPERATIVE DIAGNOSIS: chronic kidney disease stage V  POST-OPERATIVE DIAGNOSIS: same as above   SURGEON: Adele Barthel, MD  ASSISTANT(S): Silva Bandy, PAC   ANESTHESIA: general  ESTIMATED BLOOD LOSS: 30 cc  FINDING(S): 1.  Non-pulsatile radial artery 2.  Palpable thrill with dopplerable radial signal at end of case  SPECIMEN(S):  none  INDICATIONS:   Leonard [name] is a 66 y.o. male who presents with imminent end stage renal disease.  The patient is scheduled for left radiocephalic vs brachiocephalic arteriovenous fistula placement.  The patient is aware the risks include but are not limited to: bleeding, infection, steal syndrome, nerve damage, ischemic monomelic neuropathy, failure to mature, and need for additional procedures.  The patient is aware of the risks of the procedure and elects to proceed forward.   DESCRIPTION: After full informed written consent was obtained from the patient, the patient was brought back to the operating room and placed supine upon the operating table.  Prior to induction, the patient received IV antibiotics.   After obtaining adequate anesthesia, the patient was then prepped and draped in the standard fashion for a left arm access procedure.  I turned my attention first to identifying the patient's cephalic vein, radial artery and brachial artery.  Using SonoSite guidance, the location of these vessels were marked out on the skin.   The radial artery did not appear pulsatile, so I elected to proceed with brachiocephalic arteriovenous fistula given the poor inflow artery.  At this point, I injected local anesthetic to obtain a field block of the antecubitum.  In total, I injected about 7 mL of 1% lidocaine without epinephrine.  I made a transversed incision at the level of the antecubitum and dissected through the subcutaneous tissue and fascia to gain exposure of  the brachial artery.  This was noted to be 4 mm in diameter externally.  This was dissected out proximally and distally and controlled with vessel loops .  I then dissected out the cephalic vein.  This was noted to be 4 mm in diameter externally.  The distal segment of the vein was ligated with a  2-0 silk, and the vein was transected.  The proximal segment was interrogated with serial dilators.  The vein accepted up to a 4 mm dilator without any difficulty.  I then instilled the heparinized saline into the vein and clamped it.  At this point, I reset my exposure of the brachial artery and placed the artery under tension proximally and distally.  I made an arteriotomy with a #11 blade, and then I extended the arteriotomy with a Potts scissor.  I injected heparinized saline proximal and distal to this arteriotomy.  The vein was then sewn to the artery in an end-to-side configuration with a running stitch of 6-0 Prolene.  Prior to completing this anastomosis, I allowed the vein and artery to backbleed.  There was no evidence of clot from any vessels.  I completed the anastomosis in the usual fashion and then released all vessel loops and clamps.    There was a palpable thrill in the venous outflow, and there was a dopplerable radial signal.  At this point, I irrigated out the surgical wound.  There was no further active bleeding.  The subcutaneous tissue was reapproximated with a running stitch of 3-0 Vicryl.  The skin was then reapproximated with a running subcuticular stitch of 4-0 Vicryl.  The skin was then  cleaned, dried, and reinforced with LiquidBand.  The patient tolerated this procedure well.    COMPLICATIONS: none  CONDITION: stable   Adele Barthel, MD, [facility] [place] Office: [phone] Pager: [phone]  02/11/2016, 8:51 AM

## 2016-02-12 ENCOUNTER — Encounter (HOSPITAL_COMMUNITY): Payer: Self-pay | Admitting: Vascular Surgery

## 2016-02-12 NOTE — Telephone Encounter (Signed)
Tried to contact pt to inform him of below and the line was busy. If pt calls back please give him the below information. Katharina Caper, Zaccai Chavarin D, [name]

## 2016-02-13 ENCOUNTER — Telehealth (HOSPITAL_COMMUNITY): Payer: Self-pay

## 2016-02-13 NOTE — Telephone Encounter (Signed)
Received VM on CHF clinic triage line c/o patient with 4 lb weight gain overnight. Patient states he is taking all medications as prescribed. Denies SOB.  BLEE present.  States he is feeling okay, no other complaints. Torsemide ordered 60 mg in am and 40 mg in pm. Advised per Dr. Aundra Dubin to take extra torsemide tablet this afternoon and call clinic tomorrow to update on weight and s/s. Reminded also of upcoming apt with our office on 02/18/16. Advised if s/s worsen overnight to seek emergency medical attention. Aware and agreeable to plan as stated above.   Renee Pain, RN

## 2016-02-14 ENCOUNTER — Other Ambulatory Visit (HOSPITAL_BASED_OUTPATIENT_CLINIC_OR_DEPARTMENT_OTHER): Payer: Medicaid Other

## 2016-02-14 ENCOUNTER — Ambulatory Visit (HOSPITAL_BASED_OUTPATIENT_CLINIC_OR_DEPARTMENT_OTHER): Payer: Medicaid Other

## 2016-02-14 VITALS — BP 122/55 | HR 75 | Temp 98.0°F | Resp 20

## 2016-02-14 DIAGNOSIS — N189 Chronic kidney disease, unspecified: Principal | ICD-10-CM

## 2016-02-14 DIAGNOSIS — D631 Anemia in chronic kidney disease: Secondary | ICD-10-CM

## 2016-02-14 DIAGNOSIS — N184 Chronic kidney disease, stage 4 (severe): Secondary | ICD-10-CM

## 2016-02-14 LAB — CBC WITH DIFFERENTIAL/PLATELET
BASO%: 0.2 % (ref 0.0–2.0)
Basophils Absolute: 0 10*3/uL (ref 0.0–0.1)
EOS ABS: 0.1 10*3/uL (ref 0.0–0.5)
EOS%: 1.4 % (ref 0.0–7.0)
HEMATOCRIT: 30.8 % — AB (ref 38.4–49.9)
HEMOGLOBIN: 9.5 g/dL — AB (ref 13.0–17.1)
LYMPH#: 0.9 10*3/uL (ref 0.9–3.3)
LYMPH%: 15.9 % (ref 14.0–49.0)
MCH: 23.3 pg — AB (ref 27.2–33.4)
MCHC: 30.8 g/dL — ABNORMAL LOW (ref 32.0–36.0)
MCV: 75.5 fL — AB (ref 79.3–98.0)
MONO#: 0.5 10*3/uL (ref 0.1–0.9)
MONO%: 9 % (ref 0.0–14.0)
NEUT%: 73.5 % (ref 39.0–75.0)
NEUTROS ABS: 4.3 10*3/uL (ref 1.5–6.5)
Platelets: 162 10*3/uL (ref 140–400)
RBC: 4.08 10*6/uL — ABNORMAL LOW (ref 4.20–5.82)
RDW: 20 % — ABNORMAL HIGH (ref 11.0–14.6)
WBC: 5.9 10*3/uL (ref 4.0–10.3)

## 2016-02-14 MED ORDER — DARBEPOETIN ALFA 500 MCG/ML IJ SOSY
500.0000 ug | PREFILLED_SYRINGE | Freq: Once | INTRAMUSCULAR | Status: AC
  Administered 2016-02-14: 500 ug via SUBCUTANEOUS
  Filled 2016-02-14: qty 1

## 2016-02-14 NOTE — Telephone Encounter (Signed)
Called number twice. Busy signal both times. Ottis Stain, CMA

## 2016-02-14 NOTE — Patient Instructions (Signed)
Darbepoetin Alfa injection What is this medicine? DARBEPOETIN ALFA (dar be POE e tin AL fa) helps your body make more red blood cells. It is used to treat anemia caused by chronic kidney failure and chemotherapy. This medicine may be used for other purposes; ask your health care provider or pharmacist if you have questions. What should I tell my health care provider before I take this medicine? They need to know if you have any of these conditions: -blood clotting disorders or history of blood clots -cancer patient not on chemotherapy -cystic fibrosis -heart disease, such as angina, heart failure, or a history of a heart attack -hemoglobin level of 12 g/dL or greater -high blood pressure -low levels of folate, iron, or vitamin B12 -seizures -an unusual or allergic reaction to darbepoetin, erythropoietin, albumin, hamster proteins, latex, other medicines, foods, dyes, or preservatives -pregnant or trying to get pregnant -breast-feeding How should I use this medicine? This medicine is for injection into a vein or under the skin. It is usually given by a health care professional in a hospital or clinic setting. If you get this medicine at home, you will be taught how to prepare and give this medicine. Do not shake the solution before you withdraw a dose. Use exactly as directed. Take your medicine at regular intervals. Do not take your medicine more often than directed. It is important that you put your used needles and syringes in a special sharps container. Do not put them in a trash can. If you do not have a sharps container, call your pharmacist or healthcare provider to get one. Talk to your pediatrician regarding the use of this medicine in children. While this medicine may be used in children as young as 1 year for selected conditions, precautions do apply. Overdosage: If you think you have taken too much of this medicine contact a poison control center or emergency room at once. NOTE:  This medicine is only for you. Do not share this medicine with others. What if I miss a dose? If you miss a dose, take it as soon as you can. If it is almost time for your next dose, take only that dose. Do not take double or extra doses. What may interact with this medicine? Do not take this medicine with any of the following medications: -epoetin alfa This list may not describe all possible interactions. Give your health care provider a list of all the medicines, herbs, non-prescription drugs, or dietary supplements you use. Also tell them if you smoke, drink alcohol, or use illegal drugs. Some items may interact with your medicine. What should I watch for while using this medicine? Visit your prescriber or health care professional for regular checks on your progress and for the needed blood tests and blood pressure measurements. It is especially important for the doctor to make sure your hemoglobin level is in the desired range, to limit the risk of potential side effects and to give you the best benefit. Keep all appointments for any recommended tests. Check your blood pressure as directed. Ask your doctor what your blood pressure should be and when you should contact him or her. As your body makes more red blood cells, you may need to take iron, folic acid, or vitamin B supplements. Ask your doctor or health care provider which products are right for you. If you have kidney disease continue dietary restrictions, even though this medication can make you feel better. Talk with your doctor or health care professional about the   foods you eat and the vitamins that you take. What side effects may I notice from receiving this medicine? Side effects that you should report to your doctor or health care professional as soon as possible: -allergic reactions like skin rash, itching or hives, swelling of the face, lips, or tongue -breathing problems -changes in vision -chest pain -confusion, trouble speaking  or understanding -feeling faint or lightheaded, falls -high blood pressure -muscle aches or pains -pain, swelling, warmth in the leg -rapid weight gain -severe headaches -sudden numbness or weakness of the face, arm or leg -trouble walking, dizziness, loss of balance or coordination -seizures (convulsions) -swelling of the ankles, feet, hands -unusually weak or tired Side effects that usually do not require medical attention (report to your doctor or health care professional if they continue or are bothersome): -diarrhea -fever, chills (flu-like symptoms) -headaches -nausea, vomiting -redness, stinging, or swelling at site where injected This list may not describe all possible side effects. Call your doctor for medical advice about side effects. You may report side effects to FDA at 1-800-FDA-1088. Where should I keep my medicine? Keep out of the reach of children. Store in a refrigerator between 2 and 8 degrees C (36 and 46 degrees F). Do not freeze. Do not shake. Throw away any unused portion if using a single-dose vial. Throw away any unused medicine after the expiration date. NOTE: This sheet is a summary. It may not cover all possible information. If you have questions about this medicine, talk to your doctor, pharmacist, or health care provider.    2016, [employer]. (2008-01-04 10:23:57)  

## 2016-02-15 NOTE — Telephone Encounter (Signed)
Spoke to pt. Gave him number for [facility] to call and schedule colonoscopy. Ottis Stain, CMA

## 2016-02-18 ENCOUNTER — Ambulatory Visit (HOSPITAL_COMMUNITY)
Admission: RE | Admit: 2016-02-18 | Discharge: 2016-02-18 | Disposition: A | Discharge status: Home / Self Care | Payer: Medicaid Other | Source: Ambulatory Visit | Attending: Cardiology | Admitting: Cardiology

## 2016-02-18 ENCOUNTER — Encounter (HOSPITAL_COMMUNITY): Payer: Medicaid Other

## 2016-02-18 ENCOUNTER — Encounter (HOSPITAL_COMMUNITY): Payer: Self-pay

## 2016-02-18 VITALS — BP 122/60 | HR 69 | Wt 214.8 lb

## 2016-02-18 DIAGNOSIS — N185 Chronic kidney disease, stage 5: Secondary | ICD-10-CM | POA: Insufficient documentation

## 2016-02-18 DIAGNOSIS — G4733 Obstructive sleep apnea (adult) (pediatric): Secondary | ICD-10-CM | POA: Diagnosis not present

## 2016-02-18 DIAGNOSIS — Z801 Family history of malignant neoplasm of trachea, bronchus and lung: Secondary | ICD-10-CM | POA: Diagnosis not present

## 2016-02-18 DIAGNOSIS — I2510 Atherosclerotic heart disease of native coronary artery without angina pectoris: Secondary | ICD-10-CM | POA: Insufficient documentation

## 2016-02-18 DIAGNOSIS — Z8249 Family history of ischemic heart disease and other diseases of the circulatory system: Secondary | ICD-10-CM | POA: Diagnosis not present

## 2016-02-18 DIAGNOSIS — I10 Essential (primary) hypertension: Secondary | ICD-10-CM

## 2016-02-18 DIAGNOSIS — E785 Hyperlipidemia, unspecified: Secondary | ICD-10-CM | POA: Insufficient documentation

## 2016-02-18 DIAGNOSIS — Z79899 Other long term (current) drug therapy: Secondary | ICD-10-CM | POA: Diagnosis not present

## 2016-02-18 DIAGNOSIS — E1122 Type 2 diabetes mellitus with diabetic chronic kidney disease: Secondary | ICD-10-CM | POA: Diagnosis not present

## 2016-02-18 DIAGNOSIS — I739 Peripheral vascular disease, unspecified: Secondary | ICD-10-CM | POA: Diagnosis not present

## 2016-02-18 DIAGNOSIS — J449 Chronic obstructive pulmonary disease, unspecified: Secondary | ICD-10-CM | POA: Insufficient documentation

## 2016-02-18 DIAGNOSIS — Z7982 Long term (current) use of aspirin: Secondary | ICD-10-CM | POA: Insufficient documentation

## 2016-02-18 DIAGNOSIS — Z9981 Dependence on supplemental oxygen: Secondary | ICD-10-CM | POA: Insufficient documentation

## 2016-02-18 DIAGNOSIS — Z951 Presence of aortocoronary bypass graft: Secondary | ICD-10-CM

## 2016-02-18 DIAGNOSIS — E039 Hypothyroidism, unspecified: Secondary | ICD-10-CM | POA: Insufficient documentation

## 2016-02-18 DIAGNOSIS — I132 Hypertensive heart and chronic kidney disease with heart failure and with stage 5 chronic kidney disease, or end stage renal disease: Secondary | ICD-10-CM | POA: Diagnosis not present

## 2016-02-18 DIAGNOSIS — Z87891 Personal history of nicotine dependence: Secondary | ICD-10-CM | POA: Insufficient documentation

## 2016-02-18 DIAGNOSIS — D472 Monoclonal gammopathy: Secondary | ICD-10-CM | POA: Insufficient documentation

## 2016-02-18 DIAGNOSIS — I5032 Chronic diastolic (congestive) heart failure: Secondary | ICD-10-CM | POA: Diagnosis present

## 2016-02-18 DIAGNOSIS — Z794 Long term (current) use of insulin: Secondary | ICD-10-CM | POA: Diagnosis not present

## 2016-02-18 MED ORDER — TORSEMIDE 20 MG PO TABS: 40.0000 mg | ORAL_TABLET | Freq: Two times a day (BID) | ORAL | 3 refills | Status: DC

## 2016-02-18 NOTE — Patient Instructions (Signed)
Continue taking torsemide 40 mg (2 Tablets) Two times daily.  You may take an extra 20 mg (1 Tablet) with weight gain above 212 lbs.   Follow up in 3 months

## 2016-02-18 NOTE — Progress Notes (Signed)
PCP: Dr. Juanito Doom Cardiology: Dr. Aundra Dubin  66 yo with history of CAD s/p CABG, PAD, chronic diastolic CHF, CKD stage IV, COPD on home oxygen, and HTN presents for cardiology followup.  He has had multiple admissions this year for diastolic CHF.  Last echo in 5/17 showed EF 60-65% with grade II diastolic dysfunction.  He also has COPD with PFTs showing severe obstruction.    Today he returns for HF follow up with his  sister.  Earlier this moth he had AVF placed. Overall feeling ok. Complains of fatigue. SOB walking in the  grocery store. SOB with ADLs. No bleeding problems. No fever or chills. Weight at home 209-201 pounds. Last week torsemide was increased to 60 mg in am and 40 mg in am however he continued torsemide 40 mg twice a day. Marland KitchenAppetite poor. Per Leonard [name] paramedicine he oftern eats frozen meals. Followed by Leonard [name] with Paramedicine.  Lives with his sister.   Labs (8/17): K 3.9, creatinine 2.33, HCT 28.5, LDL 46 Labs (11/17): hgb 9, K 4.4, creatinine 3, BNP 64  PMH: 1. Chronic diastolic CHF: Multiple recent admissions.  Echo (5/17) with EF 60-65%, grade II diastolic dysfunction, normal RV size and systolic function.  2. PAD: Right femoral PCI in 12/14. Peripheral arterial dopplers in 6/17 with occluded right SFA.  He follows with [facility].  3. HTN: Renal artery dopplers negative in 5/17.  4. Hypothyroidism 5. CKD stage IV: Follows with Dr. Marval [name]. 6. CAD: s/p CABG x 3 in 3/15.  7. OSA: Mild, not on CPAP.  8. COPD: PFTs (3/15) with FVC 77%, FEV1 47%, ratio 59%, TLC 55% => severe obstruction.  Prior smoker. He is on 2 L home O2.  9. 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 Hyperlipidemia 12. Anemia of renal disease   Social History   Social History  . Marital status: Single    Spouse name: N/A  . Number of children: N/A  . Years of education: N/A   Occupational History  . Not on file.   Social History Main Topics  . Smoking status: Former Smoker    Packs/day: 1.00    Years: 46.00   Types: Cigarettes    Start date: 02/04/1967    Quit date: 02/03/2013  . Smokeless tobacco: Never Used     Comment: Doing well with quitting.  . Alcohol use No  . Drug use: No  . Sexual activity: Not Currently   Other Topics Concern  . Not on file   Social History Narrative   Lives with sister, Leonard [name] in [place].  Retired - former Sports coach.   Family History  Problem Relation Age of Onset  . Cancer Mother     lung cancer  . Hypertension Mother   . Cancer Sister     patient thinks it was uterine cancer  . Hypertension Sister   . Heart attack Sister   . Stroke Neg Hx    ROS: All systems reviewed and negative except as per HPI.   Current Outpatient Prescriptions  Medication Sig Dispense Refill  . amLODipine (NORVASC) 10 MG tablet Take 10 mg by mouth daily.    Marland Kitchen aspirin 81 MG tablet TAKE 81 MG BY MOUTH EVERY DAY 30 tablet 3  . atorvastatin (LIPITOR) 40 MG tablet TAKE 1 TABLET BY MOUTH EVERY DAY AT 6 PM (Patient taking differently: Take 40 mg by mouth daily at 6 PM. ) 90 tablet 3  . carvedilol (COREG) 25 MG tablet Take 2 tablets (50 mg total) by  mouth 2 (two) times daily with a meal. 120 tablet 3  . cloNIDine (CATAPRES) 0.2 MG tablet TAKE ONE (1) TABLET BY MOUTH TWO (2) TIMES DAILY 30 tablet 1  . doxazosin (CARDURA) 8 MG tablet Take 1 tablet (8 mg total) by mouth 2 (two) times daily. 60 tablet 6  . ferrous sulfate (FERROUSUL) 325 (65 FE) MG tablet Take 1 tablet (325 mg total) by mouth 2 (two) times daily with a meal. 60 tablet 3  . hydrALAZINE (APRESOLINE) 100 MG tablet TAKE ONE TABLET BY MOUTH EVERY EIGHT HOURS (Patient taking differently: Take 100 mg by mouth every 8 (eight) hours. ) 90 tablet 3  . insulin aspart (NOVOLOG FLEXPEN) 100 UNIT/ML FlexPen Inject 20 Units into the skin daily.    . Insulin Glargine (LANTUS SOLOSTAR) 100 UNIT/ML Solostar Pen Inject 30-60 Units into the skin at bedtime. Per BS count    . isosorbide mononitrate (IMDUR) 60 MG 24 hr tablet Take 60 mg  by mouth daily.    Marland Kitchen levothyroxine (SYNTHROID, LEVOTHROID) 50 MCG tablet TAKE ONE (1) TABLET BY MOUTH EVERY DAY BEFORE BREAKFAST 30 tablet 3  . NIFEdipine (PROCARDIA XL/ADALAT-CC) 60 MG 24 hr tablet Take 2 tablets (120 mg total) by mouth daily. 60 tablet 3  . nitroGLYCERIN (NITROSTAT) 0.4 MG SL tablet Place 1 tablet (0.4 mg total) under the tongue every 5 (five) minutes as needed for chest pain. 30 tablet 12  . oxyCODONE-acetaminophen (PERCOCET/ROXICET) 5-325 MG tablet Take 1 tablet by mouth every 6 (six) hours as needed. 6 tablet 0  . pantoprazole (PROTONIX) 40 MG tablet TAKE 1 TABLET BY MOUTH DAILY (Patient taking differently: TAKE 40 MG BY MOUTH DAILY) 30 tablet 3  . potassium chloride SA (K-DUR,KLOR-CON) 20 MEQ tablet Take 56meq (1 tablet ) in the AM and 70meq (1/2 tablet) in the PM (Patient taking differently: Take 10-20 mEq by mouth See admin instructions. Take 57meq (1 tablet ) in the AM and 32meq (1/2 tablet) in the PM) 135 tablet 3  . PROVENTIL HFA 108 (90 Base) MCG/ACT inhaler INHALE 2 PUFFS INTO THE LUNGS EVERY 6 HOURS AS NEEDED FOR WHEEZING ORSHORTNESS OF BREATH 6.7 g 3  . Tiotropium Bromide Monohydrate (SPIRIVA RESPIMAT) 2.5 MCG/ACT AERS Inhale 2 puffs into the lungs daily. 1 Inhaler 0  . torsemide (DEMADEX) 20 MG tablet Take 2-3 tablets (40-60 mg total) by mouth See admin instructions. Take 60 mg by mouth in the morning and take 40 mg by mouth in the evening     No current facility-administered medications for this encounter.      BP 122/60 (BP Location: Right Arm, Patient Position: Sitting, Cuff Size: Normal)   Pulse 69   Wt 214 lb 12.8 oz (97.4 kg)   SpO2 92%   BMI 32.66 kg/m  General: NAD. Ambulated in the clinic. Sister present.  Neck: JVP 7-8 cm, no thyromegaly or thyroid nodule.  Lungs: Distant BS bilaterally.  CV: Nondisplaced PMI.  Heart regular S1/S2, no S3/S4, no murmur.  R and LLE bilateral ankle and foot 1+ edema bilaterally.  No carotid bruit.  Unable to palpate  pedal pulses.  Abdomen: Soft, nontender, no hepatosplenomegaly, no distention.  Skin: Intact without lesions or rashes.  Neurologic: Alert and oriented x 3.  Psych: Normal affect. Extremities: No clubbing or cyanosis. Warm. LUE derma bond from AVF.  HEENT: Normal.   Assessment/Plan: 1. Chronic diastolic CHF:  This is worsened by stage IV CKD.   NYHA class II-III symptoms, suspect COPD plays a role  in this.  Mild volume overload. He does not want to take higher dose of torsemide. Continue torsemide 40 mg twice a day with extra 20 mg torsemide for weight 212 or greater.  Reinforced low salt diet, limiting fluid intake to < 2 liters per day, and daily weights.   2. HTN: BP stable. Continue current regimen.   3. CAD: S/p CABG.  No chest pain.  Continue ASA 81 and statin.  Good lipids 8/17.   4. COPD: Severe by 3/15 PFTs.  He has quit smoking.  He is on home oxygen.  I suspect COPD plays a significant role in generating his dyspnea. I would like him to establish with pulmonary, will make referral.  5. CKD: Stage V.- No weight L AVF. Sees Dr Leonard [name].     6. PAD: Not particularly limited by claudication now.  Saw [facility] 6/17, has followup.   Follow up in 3 months. Continue weekly follow up with [facility]amed[facility].   Amy Clegg NP-C  02/18/2016

## 2016-02-19 ENCOUNTER — Telehealth (HOSPITAL_COMMUNITY): Payer: Self-pay

## 2016-02-19 ENCOUNTER — Other Ambulatory Visit (HOSPITAL_COMMUNITY): Payer: Self-pay

## 2016-02-19 MED ORDER — AMLODIPINE BESYLATE 10 MG PO TABS: 10.0000 mg | ORAL_TABLET | Freq: Every day | ORAL | 6 refills | Status: DC

## 2016-02-19 NOTE — Telephone Encounter (Signed)
[name] with paramedicine program calling to clarify medications for patient. Patient has amlodipine and cardura listed on active med list, however, katie states pharmacy DC'd amlodipine due to incompatibility with cardura. Asking if amlodipine should still be continued.  Advised will have to check with CHF providers and follow up tomorrow as clinic is now closed and no providers available to discuss at this time.  Advised to hold amlodipine if he has any at home for now. Aware and agreeable. Will forward to Seymour NP-C who last saw patient in CHF clinic.  Renee Pain, RN

## 2016-02-25 NOTE — Telephone Encounter (Signed)
Per Ileene Patrick PharmD for CHF clinic, team is aware of this and patient should remain on current medication regimen. [facility] made aware.  Renee Pain, RN

## 2016-02-26 ENCOUNTER — Other Ambulatory Visit: Payer: Self-pay | Admitting: Family Medicine

## 2016-02-28 ENCOUNTER — Other Ambulatory Visit (HOSPITAL_BASED_OUTPATIENT_CLINIC_OR_DEPARTMENT_OTHER): Payer: Medicaid Other

## 2016-02-28 ENCOUNTER — Telehealth: Payer: Self-pay | Admitting: Hematology and Oncology

## 2016-02-28 ENCOUNTER — Encounter: Payer: Self-pay | Admitting: Hematology and Oncology

## 2016-02-28 ENCOUNTER — Ambulatory Visit (HOSPITAL_BASED_OUTPATIENT_CLINIC_OR_DEPARTMENT_OTHER): Payer: Medicaid Other

## 2016-02-28 ENCOUNTER — Other Ambulatory Visit: Payer: Self-pay | Admitting: Hematology and Oncology

## 2016-02-28 ENCOUNTER — Ambulatory Visit (HOSPITAL_BASED_OUTPATIENT_CLINIC_OR_DEPARTMENT_OTHER): Payer: Medicaid Other | Admitting: Hematology and Oncology

## 2016-02-28 DIAGNOSIS — D509 Iron deficiency anemia, unspecified: Secondary | ICD-10-CM | POA: Diagnosis not present

## 2016-02-28 DIAGNOSIS — D649 Anemia, unspecified: Secondary | ICD-10-CM | POA: Insufficient documentation

## 2016-02-28 DIAGNOSIS — D631 Anemia in chronic kidney disease: Secondary | ICD-10-CM

## 2016-02-28 DIAGNOSIS — N184 Chronic kidney disease, stage 4 (severe): Secondary | ICD-10-CM

## 2016-02-28 DIAGNOSIS — D472 Monoclonal gammopathy: Secondary | ICD-10-CM | POA: Diagnosis present

## 2016-02-28 HISTORY — DX: Anemia, unspecified: D64.9

## 2016-02-28 LAB — CBC WITH DIFFERENTIAL/PLATELET
BASO%: 0.5 % (ref 0.0–2.0)
BASOS ABS: 0 10*3/uL (ref 0.0–0.1)
EOS ABS: 0.1 10*3/uL (ref 0.0–0.5)
EOS%: 1.7 % (ref 0.0–7.0)
HEMATOCRIT: 31.9 % — AB (ref 38.4–49.9)
HGB: 9.8 g/dL — ABNORMAL LOW (ref 13.0–17.1)
LYMPH#: 0.8 10*3/uL — AB (ref 0.9–3.3)
LYMPH%: 12.7 % — ABNORMAL LOW (ref 14.0–49.0)
MCH: 23 pg — AB (ref 27.2–33.4)
MCHC: 30.7 g/dL — AB (ref 32.0–36.0)
MCV: 75 fL — AB (ref 79.3–98.0)
MONO#: 0.6 10*3/uL (ref 0.1–0.9)
MONO%: 9.1 % (ref 0.0–14.0)
NEUT#: 5.1 10*3/uL (ref 1.5–6.5)
NEUT%: 76 % — AB (ref 39.0–75.0)
PLATELETS: 209 10*3/uL (ref 140–400)
RBC: 4.25 10*6/uL (ref 4.20–5.82)
RDW: 21.9 % — ABNORMAL HIGH (ref 11.0–14.6)
WBC: 6.7 10*3/uL (ref 4.0–10.3)

## 2016-02-28 MED ORDER — DARBEPOETIN ALFA 500 MCG/ML IJ SOSY
500.0000 ug | PREFILLED_SYRINGE | Freq: Once | INTRAMUSCULAR | Status: AC
  Administered 2016-02-28: 500 ug via SUBCUTANEOUS
  Filled 2016-02-28: qty 1

## 2016-02-28 NOTE — Assessment & Plan Note (Signed)
The most likely cause of his anemia is due to chronic blood loss/malabsorption syndrome. We discussed some of the risks, benefits, and alternatives of intravenous iron infusions. The patient is symptomatic from anemia and the iron level is critically low. He tolerated oral iron supplement poorly and desires to achieved higher levels of iron faster for adequate hematopoesis. Some of the side-effects to be expected including risks of infusion reactions, phlebitis, headaches, nausea and fatigue.  The patient is willing to proceed. Patient education material was dispensed.  Goal is to keep ferritin level greater than 50 I will give him 1 dose of IV iron week

## 2016-02-28 NOTE — Patient Instructions (Signed)
Darbepoetin Alfa injection What is this medicine? DARBEPOETIN ALFA (dar be POE e tin AL fa) helps your body make more red blood cells. It is used to treat anemia caused by chronic kidney failure and chemotherapy. This medicine may be used for other purposes; ask your health care provider or pharmacist if you have questions. What should I tell my health care provider before I take this medicine? They need to know if you have any of these conditions: -blood clotting disorders or history of blood clots -cancer patient not on chemotherapy -cystic fibrosis -heart disease, such as angina, heart failure, or a history of a heart attack -hemoglobin level of 12 g/dL or greater -high blood pressure -low levels of folate, iron, or vitamin B12 -seizures -an unusual or allergic reaction to darbepoetin, erythropoietin, albumin, hamster proteins, latex, other medicines, foods, dyes, or preservatives -pregnant or trying to get pregnant -breast-feeding How should I use this medicine? This medicine is for injection into a vein or under the skin. It is usually given by a health care professional in a hospital or clinic setting. If you get this medicine at home, you will be taught how to prepare and give this medicine. Do not shake the solution before you withdraw a dose. Use exactly as directed. Take your medicine at regular intervals. Do not take your medicine more often than directed. It is important that you put your used needles and syringes in a special sharps container. Do not put them in a trash can. If you do not have a sharps container, call your pharmacist or healthcare provider to get one. Talk to your pediatrician regarding the use of this medicine in children. While this medicine may be used in children as young as 1 year for selected conditions, precautions do apply. Overdosage: If you think you have taken too much of this medicine contact a poison control center or emergency room at once. NOTE:  This medicine is only for you. Do not share this medicine with others. What if I miss a dose? If you miss a dose, take it as soon as you can. If it is almost time for your next dose, take only that dose. Do not take double or extra doses. What may interact with this medicine? Do not take this medicine with any of the following medications: -epoetin alfa This list may not describe all possible interactions. Give your health care provider a list of all the medicines, herbs, non-prescription drugs, or dietary supplements you use. Also tell them if you smoke, drink alcohol, or use illegal drugs. Some items may interact with your medicine. What should I watch for while using this medicine? Visit your prescriber or health care professional for regular checks on your progress and for the needed blood tests and blood pressure measurements. It is especially important for the doctor to make sure your hemoglobin level is in the desired range, to limit the risk of potential side effects and to give you the best benefit. Keep all appointments for any recommended tests. Check your blood pressure as directed. Ask your doctor what your blood pressure should be and when you should contact him or her. As your body makes more red blood cells, you may need to take iron, folic acid, or vitamin B supplements. Ask your doctor or health care provider which products are right for you. If you have kidney disease continue dietary restrictions, even though this medication can make you feel better. Talk with your doctor or health care professional about the   foods you eat and the vitamins that you take. What side effects may I notice from receiving this medicine? Side effects that you should report to your doctor or health care professional as soon as possible: -allergic reactions like skin rash, itching or hives, swelling of the face, lips, or tongue -breathing problems -changes in vision -chest pain -confusion, trouble speaking  or understanding -feeling faint or lightheaded, falls -high blood pressure -muscle aches or pains -pain, swelling, warmth in the leg -rapid weight gain -severe headaches -sudden numbness or weakness of the face, arm or leg -trouble walking, dizziness, loss of balance or coordination -seizures (convulsions) -swelling of the ankles, feet, hands -unusually weak or tired Side effects that usually do not require medical attention (report to your doctor or health care professional if they continue or are bothersome): -diarrhea -fever, chills (flu-like symptoms) -headaches -nausea, vomiting -redness, stinging, or swelling at site where injected This list may not describe all possible side effects. Call your doctor for medical advice about side effects. You may report side effects to FDA at 1-800-FDA-1088. Where should I keep my medicine? Keep out of the reach of children. Store in a refrigerator between 2 and 8 degrees C (36 and 46 degrees F). Do not freeze. Do not shake. Throw away any unused portion if using a single-dose vial. Throw away any unused medicine after the expiration date. NOTE: This sheet is a summary. It may not cover all possible information. If you have questions about this medicine, talk to your doctor, pharmacist, or health care provider.    2016, [employer]. (2008-01-04 10:23:57)  

## 2016-02-28 NOTE — Assessment & Plan Note (Signed)
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 He had responded well in the past after iron infusion. I will proceed with darbepoetin today and give him iron infusion next week. We will continue darbepoetin to keep hemoglobin greater than 10

## 2016-02-28 NOTE — Assessment & Plan Note (Signed)
The patient was originally referred here because of detectable M spike in his blood which subsequently become undetectable. 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 Extensive workup in the past few months did not take up any abnormal M spike. Although his serum light chains were high, this is likely related to renal disease and the ratio was just barely above the upper limit of normal. Repeat myeloma panel in July again is consistent with the pattern related to chronic renal failure In any case, I do not believe his renal failure and anemia is related to multiple myeloma. I plan to repeat the myeloma panel again in the summer

## 2016-02-28 NOTE — Progress Notes (Signed)
[facility] NOTE  [name], MD SUMMARY OF HEMATOLOGIC HISTORY:  Leonard [name] is here because of progressive anemia, renal failure and MGUS. I reviewed reports from his nephrologist office. The patient have significant peripheral vascular disease, diabetes and hypertension. Early this year, he had baseline creatinine of around 1.3 and subsequently developed heart failure. He was placed on aggressive medication/regimen and subsequently was noted to have progressive renal failure and anemia. According to his blood work dated 01/03/2015, white blood cell count is at 6.6, hemoglobin 7.1, platelet count of 153, creatinine of 2.4, calcium of 8.9, MCV 76, iron saturation 15%, ferritin 180, serum vitamin B-12 of 360. Serum protein electrophoresis initially detected M spike but subsequently not quantifiable. Urine immunofixation showed Bence-Jones Proteinuria. Echocardiogram show preserved ejection fraction 55-60% but with evidence of diastolic heart failure. He denies history of abnormal bone pain or bone fracture. Patient denies history of recurrent infection or atypical infections such as shingles of meningitis. Denies chills, night sweats, anorexia or abnormal weight loss. He complained of fatigue and shortness of breath on minimal exertion. The patient denies any recent signs or symptoms of bleeding such as spontaneous hematuria or hematochezia. He has occasional epistaxis when he blows his nose. He does not have spontaneous epistaxis Evaluation in December 2016 show he had anemia chronic disease and MGUS  On 02/01/2015, he is started on Aranesp, 200 g every 2 weeks to keep hemoglobin greater than 11 Between December to June 2017, the dose of Aranesp is titrated to maximum 500 g  INTERVAL HISTORY: HUSEIN GUEDES 66 y.o. male returns for further follow-up. The patient denies any recent signs or symptoms of bleeding such as spontaneous epistaxis,  hematuria or hematochezia. He denies chest pain no shortness of breath.  I have reviewed the past medical history, past surgical history, social history and family history with the patient and they are unchanged from previous note.  ALLERGIES:  is allergic to no known allergies.  MEDICATIONS:  Current Outpatient Prescriptions  Medication Sig Dispense Refill  . amLODipine (NORVASC) 10 MG tablet Take 1 tablet (10 mg total) by mouth daily. 30 tablet 6  . aspirin 81 MG tablet TAKE 81 MG BY MOUTH EVERY DAY 30 tablet 3  . atorvastatin (LIPITOR) 40 MG tablet TAKE 1 TABLET BY MOUTH EVERY DAY AT 6 PM (Patient taking differently: Take 40 mg by mouth daily at 6 PM. ) 90 tablet 3  . carvedilol (COREG) 25 MG tablet Take 2 tablets (50 mg total) by mouth 2 (two) times daily with a meal. 120 tablet 3  . cloNIDine (CATAPRES) 0.2 MG tablet TAKE ONE (1) TABLET BY MOUTH TWO (2) TIMES DAILY 30 tablet 1  . doxazosin (CARDURA) 8 MG tablet Take 1 tablet (8 mg total) by mouth 2 (two) times daily. 60 tablet 6  . ferrous sulfate (FERROUSUL) 325 (65 FE) MG tablet Take 1 tablet (325 mg total) by mouth 2 (two) times daily with a meal. 60 tablet 3  . hydrALAZINE (APRESOLINE) 100 MG tablet TAKE ONE TABLET BY MOUTH EVERY EIGHT HOURS (Patient taking differently: Take 100 mg by mouth every 8 (eight) hours. ) 90 tablet 3  . insulin aspart (NOVOLOG FLEXPEN) 100 UNIT/ML FlexPen Inject 20 Units into the skin daily.    . Insulin Glargine (LANTUS SOLOSTAR) 100 UNIT/ML Solostar Pen Inject 30-60 Units into the skin at bedtime. Per BS count    . isosorbide mononitrate (IMDUR) 60 MG 24 hr tablet Take 60 mg  by mouth daily.    . levothyroxine (SYNTHROID, LEVOTHROID) 50 MCG tablet TAKE ONE (1) TABLET BY MOUTH EVERY DAY BEFORE BREAKFAST 30 tablet 3  . NIFEdipine (PROCARDIA XL/ADALAT-CC) 60 MG 24 hr tablet Take 2 tablets (120 mg total) by mouth daily. 60 tablet 3  . nitroGLYCERIN (NITROSTAT) 0.4 MG SL tablet Place 1 tablet (0.4 mg total)  under the tongue every 5 (five) minutes as needed for chest pain. 30 tablet 12  . oxyCODONE-acetaminophen (PERCOCET/ROXICET) 5-325 MG tablet Take 1 tablet by mouth every 6 (six) hours as needed. 6 tablet 0  . pantoprazole (PROTONIX) 40 MG tablet TAKE 1 TABLET BY MOUTH DAILY (Patient taking differently: TAKE 40 MG BY MOUTH DAILY) 30 tablet 3  . potassium chloride SA (K-DUR,KLOR-CON) 20 MEQ tablet Take 20meq (1 tablet ) in the AM and 10meq (1/2 tablet) in the PM (Patient taking differently: Take 10-20 mEq by mouth See admin instructions. Take 20meq (1 tablet ) in the AM and 10meq (1/2 tablet) in the PM) 135 tablet 3  . PROVENTIL HFA 108 (90 Base) MCG/ACT inhaler INHALE 2 PUFFS INTO THE LUNGS EVERY 6 HOURS AS NEEDED FOR WHEEZING ORSHORTNESS OF BREATH 6.7 g 3  . Tiotropium Bromide Monohydrate (SPIRIVA RESPIMAT) 2.5 MCG/ACT AERS Inhale 2 puffs into the lungs daily. 1 Inhaler 0  . torsemide (DEMADEX) 20 MG tablet Take 2 tablets (40 mg total) by mouth 2 (two) times daily. May take an extra 20 mg Daily for weight gain above 212 lbs 130 tablet 3   No current facility-administered medications for this visit.      REVIEW OF SYSTEMS:   Constitutional: Denies fevers, chills or night sweats Eyes: Denies blurriness of vision Ears, nose, mouth, throat, and face: Denies mucositis or sore throat Respiratory: Denies cough, dyspnea or wheezes Cardiovascular: Denies palpitation, chest discomfort or lower extremity swelling Gastrointestinal:  Denies nausea, heartburn or change in bowel habits Skin: Denies abnormal skin rashes Lymphatics: Denies new lymphadenopathy or easy bruising Neurological:Denies numbness, tingling or new weaknesses Behavioral/Psych: Mood is stable, no new changes  All other systems were reviewed with the patient and are negative.  PHYSICAL EXAMINATION: ECOG PERFORMANCE STATUS: 0 - Asymptomatic  Vitals:   02/28/16 1152  BP: (!) 132/53  Pulse: 70  Resp: 18  Temp: 98.2 F (36.8 C)    Filed Weights   02/28/16 1152  Weight: 213 lb 14.4 oz (97 kg)    GENERAL:alert, no distress and comfortable SKIN: skin color, texture, turgor are normal, no rashes or significant lesions EYES: normal, Conjunctiva are pink and non-injected, sclera clear Musculoskeletal:no cyanosis of digits and no clubbing  NEURO: alert & oriented x 3 with fluent speech, no focal motor/sensory deficits  LABORATORY DATA:  I have reviewed the data as listed     Component Value Date/Time   NA 141 02/11/2016 0645   NA 140 08/16/2015 1554   K 4.1 02/11/2016 0645   K 3.8 08/16/2015 1554   CL 108 01/24/2016 1027   CO2 21 (L) 01/24/2016 1027   CO2 21 (L) 08/16/2015 1554   GLUCOSE 219 (H) 02/11/2016 0645   GLUCOSE 133 08/16/2015 1554   BUN 51 (H) 01/24/2016 1027   BUN 51.0 (H) 08/16/2015 1554   CREATININE 3.93 (H) 01/24/2016 1027   CREATININE 3.64 (H) 01/09/2016 1606   CREATININE 2.7 (H) 08/16/2015 1554   CALCIUM 8.9 01/24/2016 1027   CALCIUM NOPLAS 10/24/2015 1415   CALCIUM 8.9 08/16/2015 1554   PROT 7.1 01/13/2016 2316   PROT 7.0   08/16/2015 1554   ALBUMIN 3.6 01/13/2016 2316   ALBUMIN 3.4 (L) 08/16/2015 1554   AST 33 01/13/2016 2316   AST 17 08/16/2015 1554   ALT 20 01/13/2016 2316   ALT 13 08/16/2015 1554   ALKPHOS 102 01/13/2016 2316   ALKPHOS 89 08/16/2015 1554   BILITOT 0.4 01/13/2016 2316   BILITOT 0.30 08/16/2015 1554   GFRNONAA 15 (L) 01/24/2016 1027   GFRNONAA 27 (L) 07/20/2015 0949   GFRAA 17 (L) 01/24/2016 1027   GFRAA 31 (L) 07/20/2015 0949    No results found for: SPEP, UPEP  Lab Results  Component Value Date   WBC 6.7 02/28/2016   NEUTROABS 5.1 02/28/2016   HGB 9.8 (L) 02/28/2016   HCT 31.9 (L) 02/28/2016   MCV 75.0 (L) 02/28/2016   PLT 209 02/28/2016      Chemistry      Component Value Date/Time   NA 141 02/11/2016 0645   NA 140 08/16/2015 1554   K 4.1 02/11/2016 0645   K 3.8 08/16/2015 1554   CL 108 01/24/2016 1027   CO2 21 (L) 01/24/2016 1027   CO2  21 (L) 08/16/2015 1554   BUN 51 (H) 01/24/2016 1027   BUN 51.0 (H) 08/16/2015 1554   CREATININE 3.93 (H) 01/24/2016 1027   CREATININE 3.64 (H) 01/09/2016 1606   CREATININE 2.7 (H) 08/16/2015 1554      Component Value Date/Time   CALCIUM 8.9 01/24/2016 1027   CALCIUM NOPLAS 10/24/2015 1415   CALCIUM 8.9 08/16/2015 1554   ALKPHOS 102 01/13/2016 2316   ALKPHOS 89 08/16/2015 1554   AST 33 01/13/2016 2316   AST 17 08/16/2015 1554   ALT 20 01/13/2016 2316   ALT 13 08/16/2015 1554   BILITOT 0.4 01/13/2016 2316   BILITOT 0.30 08/16/2015 1554       ASSESSMENT & PLAN:  MGUS (monoclonal gammopathy of unknown significance)  The patient was originally referred here because of detectable M spike in his blood which subsequently become undetectable. 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 Extensive workup in the past few months did not take up any abnormal M spike. Although his serum light chains were high, this is likely related to renal disease and the ratio was just barely above the upper limit of normal. Repeat myeloma panel in July again is consistent with the pattern related to chronic renal failure In any case, I do not believe his renal failure and anemia is related to multiple myeloma. 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 He had responded well in the past after iron infusion. I will proceed with darbepoetin today and give him iron infusion next week. We will continue darbepoetin to keep hemoglobin greater than 10  Iron deficiency anemia The most likely cause of his anemia is due to chronic blood loss/malabsorption syndrome. We discussed some of the risks, benefits, and alternatives of intravenous iron infusions. The patient is symptomatic from  anemia and the iron level is critically low. He tolerated oral iron supplement poorly and desires to achieved higher levels of iron faster for adequate hematopoesis. Some of the side-effects to be expected including risks of infusion reactions, phlebitis, headaches, nausea and fatigue.  The patient is willing to proceed. Patient education material was dispensed.  Goal is to keep ferritin level greater   than 50 I will give him 1 dose of IV iron week    No orders of the defined types were placed in this encounter.   All questions were answered. The patient knows to call the clinic with any problems, questions or concerns. No barriers to learning was detected.  I spent 15 minutes counseling the patient face to face. The total time spent in the appointment was 20 minutes and more than 50% was on counseling.     Heath Lark, MD 1/25/20186:02 PM

## 2016-02-28 NOTE — Telephone Encounter (Signed)
Message sent to [facility] for Feraheme to be added per 02/28/16 los. Appointment scheduled per 02/28/16 los. Patient was given a copy of the AVS report and appointment schedule per 02/28/16 los.

## 2016-03-04 ENCOUNTER — Encounter: Payer: Self-pay | Admitting: Family Medicine

## 2016-03-04 ENCOUNTER — Ambulatory Visit (INDEPENDENT_AMBULATORY_CARE_PROVIDER_SITE_OTHER): Payer: Medicaid Other | Admitting: Family Medicine

## 2016-03-04 DIAGNOSIS — E1122 Type 2 diabetes mellitus with diabetic chronic kidney disease: Secondary | ICD-10-CM | POA: Diagnosis not present

## 2016-03-04 DIAGNOSIS — E1121 Type 2 diabetes mellitus with diabetic nephropathy: Secondary | ICD-10-CM

## 2016-03-04 DIAGNOSIS — I5032 Chronic diastolic (congestive) heart failure: Secondary | ICD-10-CM

## 2016-03-04 DIAGNOSIS — N183 Chronic kidney disease, stage 3 (moderate): Secondary | ICD-10-CM

## 2016-03-04 DIAGNOSIS — N184 Chronic kidney disease, stage 4 (severe): Secondary | ICD-10-CM

## 2016-03-04 DIAGNOSIS — D631 Anemia in chronic kidney disease: Secondary | ICD-10-CM

## 2016-03-04 DIAGNOSIS — Z794 Long term (current) use of insulin: Secondary | ICD-10-CM | POA: Diagnosis not present

## 2016-03-04 NOTE — Progress Notes (Signed)
Subjective:    Patient ID: Leonard [name] , male   DOB: 1950/11/19 , 66 y.o..   MRN: [id number]  HPI  HEBERT DOOLING is here for  Chief Complaint  Patient presents with  . Follow-up   Chronic Diabetes:  Disease Monitoring  Blood Sugar Ranges: 200's  Polyuria: no   Visual problems: no, states he went to the eye doctor and his eyes were normal  Medication Compliance: no. Patient is supposed to be taking Lantus 60 units daily with 20 units of novolog each meal. He has only been taking Lantus 30 units daily and Novolog a couple times a week.  Medication Side Effects  Hypoglycemia: no   Preventitive Health Care  Eye Exam: Martin Majestic recently, stated everything is ok. Will wait for report  Foot Exam: Needs foot exam still  Diet pattern: does not follow diabetic diet  Exercise: does not exercise  Leg swelling:  Patient states that for the last week he has had swelling in his legs. He notes that it has been getting progressively worse over the week. He denies any shortness of breath, difficulty with ambulation, chest pain, palpitations. Denies any skin changes. He has not had any periods of decreased mobility. Denies any leg pain.   Anemia:  Getting iron infusions now from hematolgist. Denies any current fatigue, lightheadedness, dizziness.  Review of Systems: Per HPI. All other systems reviewed and are negative.  Past Medical History: Patient Active Problem List   Diagnosis Date Noted  . Anemia due to stage 4 chronic kidney disease treated with darbepoetin ([place]) 02/28/2016  . Fatigue 01/21/2016  . Diabetic retinopathy ([place]) 11/13/2015  . CHF (congestive heart failure) ([place]) 08/27/2015  . Iron deficiency anemia 08/03/2015  . Claudication ([place])   . Type 2 diabetes mellitus with stage 3 chronic kidney disease, with long-term current use of insulin ([place])   . Hematuria 06/27/2015  . Skin lesion of face 02/22/2015  . 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  Anemia 01/15/2015  . CKD (chronic kidney disease) stage 4, GFR 15-29 ml/min (HCC) 01/15/2015  . Hypothyroidism 11/22/2014  . COPD GOLD III  02/16/2014  . History of tobacco use 08/23/2013  . 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 CAD (coronary artery disease) 04/06/2013  . PVD - hx of Rt SFA PTA and s/p Rt 4-5th toe amp Dec 2014 04/05/2013  . Essential hypertension 11/25/2012  . Mixed hyperlipidemia 07/17/2008    Medications: Patient brought in pill bottles but is unable to tell me what he is taking   Social Hx:  reports that he quit smoking about 3 years ago. His smoking use included Cigarettes. He started smoking about 49 years ago. He has a 46.00 pack-year smoking history. He has never used smokeless tobacco.   Objective:   BP 130/70 (BP Location: Left Arm, Patient Position: Sitting, Cuff Size: Normal)   Pulse 72   Temp 98.2 F (36.8 C) (Oral)   Ht 5\' 8"  (1.727 m)   Wt 217 lb (98.4 kg)   SpO2 98%   BMI 32.99 kg/m  Physical Exam  Gen: NAD, alert, cooperative with exam, well-appearing HEENT: NCAT, PERRL, clear conjunctiva, oropharynx clear, supple neck Cardiac: Regular rate and rhythm, normal S1/S2, no murmur, capillary refill brisk, 2+ pitting edema in bilateral ankles up to mid shin Respiratory: Clear to auscultation bilaterally, no wheezes, non-labored breathing Gastrointestinal: soft, non tender, non distended, bowel sounds present Skin: no rashes, normal turgor  Neurological: no gross deficits.  Psych: good insight, normal mood and affect   Assessment & Plan:  Type 2 diabetes mellitus with stage 3 chronic kidney disease, with long-term current use of insulin ([place]) Uncontrolled per last hgb A1c of 9.7 in December 2017. Fasting glucose has been in the 200s per pt report. Patient notes he is only taking 30 units of Lantus daily and 20 units of Novolog "sometimes". Discussed extensively with patient the risks of not taking insulin as prescribed. He is aware and has had good diabetic  teaching in the past in the inpatient and outpatient setting.  - Increase Lantus to 40 units daily - Stop novolog for now as I do not truly know what patient's glucose has been ranging during the day - Asked patient to please check glucose daily at least first thing in the morning and keep a log - Advised that he follow up with Dr. Valentina Lucks again in the next couple days to review his recorded glucose with the increased dose of Lantus and see if there are any adjustments to be made to his insulin - Will also follow up with me in 1 week - Hypoglycemic symptoms discussed  Anemia Stable. He is following up with hematology for iron infusions.  CHF (congestive heart failure) (HCC) Very prone to CHF exacerbations which is complicated by worsening CKD. Noted to have ankle edema on exam. This edema seems slightly more than previous exams. Weight increased 5 lbs over the last week. Normal pulmonary exam is reassuring.  - Will give extra dose of Torsemide (20 mg) today - Continue CHF medications as prescribed - Return precautions and red flag symptoms discussed - Follow up in 1 week   Smitty Cords, MD [place], PGY-2

## 2016-03-04 NOTE — Patient Instructions (Signed)
Thank you for coming in today, it was so nice to see you! Today we talked about:    Diabetes: Please take 40 units of Lantus daily. Do not take the Novolog short acting insulin. Check your sugar every morning before you eat. Keep a log of your sugars. We will meet in 1 week and review your sugars. If your sugars are too high, then we will increase your insulin at the next visit.   Please schedule an appointment with Nicholas Lose before you leave today to talk about diabetes medications  Leg swelling: Please take an extra pill of torsemide today ( 1 20 mg tablet). Go to the hospital if you are having trouble breathing or your swelling gets worse.   Bring in all your medications or supplements to each appointment for review.   If you have any questions or concerns, please do not hesitate to call the office at [phone]. You can also message me directly via MyChart.   Sincerely,  Smitty Cords, MD

## 2016-03-05 ENCOUNTER — Telehealth (HOSPITAL_COMMUNITY): Payer: Self-pay | Admitting: *Deleted

## 2016-03-05 DIAGNOSIS — I5032 Chronic diastolic (congestive) heart failure: Secondary | ICD-10-CM

## 2016-03-05 NOTE — Assessment & Plan Note (Addendum)
Very prone to CHF exacerbations which is complicated by worsening CKD. Noted to have ankle edema on exam. This edema seems slightly more than previous exams. Weight increased 5 lbs over the last week. Normal pulmonary exam is reassuring.  - Will give extra dose of Torsemide (20 mg) today - Continue CHF medications as prescribed - Return precautions and red flag symptoms discussed - Follow up in 1 week

## 2016-03-05 NOTE — Telephone Encounter (Signed)
D with paramed called yesterday afternoon saying patient was told to take an extra Torsemide 20 mg (1 Tablet) for one day only for leg swelling.  She asked [place] to verify.  After reading office visit note with Dr. Juanito Doom, they did advise him to take an extra torsemide for leg swelling.  No further questions at this time.

## 2016-03-05 NOTE — Telephone Encounter (Signed)
Leonard [name] called to report pt's wt is up to 215 lb today, was 214 yesterday and 209 on Fri.  She states pt's legs are swollen from toes to half way up calf and are weeping.  She states they were not swollen when she saw him on Fri, pt states he has been taking meds and she reports meds are gone from his pill box.  Discussed w/Leonard Chalmers Cater, PA he recommends pt increase Torsemide to 60 mg BID for 3 days and labs on Mon.  Leonard [name] is aware, she will make changes for pt, she will see him again on Fri to see if he is doing better, lab appt sch for Mon 2/5

## 2016-03-05 NOTE — Assessment & Plan Note (Signed)
Stable. He is following up with hematology for iron infusions.

## 2016-03-05 NOTE — Assessment & Plan Note (Signed)
Uncontrolled per last hgb A1c of 9.7 in December 2017. Fasting glucose has been in the 200s per pt report. Patient notes he is only taking 30 units of Lantus daily and 20 units of Novolog "sometimes". Discussed extensively with patient the risks of not taking insulin as prescribed. He is aware and has had good diabetic teaching in the past in the inpatient and outpatient setting.  - Increase Lantus to 40 units daily - Stop novolog for now as I do not truly know what patient's glucose has been ranging during the day - Asked patient to please check glucose daily at least first thing in the morning and keep a log - Advised that he follow up with Dr. Valentina Lucks again in the next couple days to review his recorded glucose with the increased dose of Lantus and see if there are any adjustments to be made to his insulin - Will also follow up with me in 1 week - Hypoglycemic symptoms discussed

## 2016-03-07 ENCOUNTER — Ambulatory Visit (HOSPITAL_BASED_OUTPATIENT_CLINIC_OR_DEPARTMENT_OTHER): Payer: Medicaid Other

## 2016-03-07 VITALS — BP 117/57 | HR 65 | Temp 98.0°F | Resp 17

## 2016-03-07 DIAGNOSIS — D509 Iron deficiency anemia, unspecified: Secondary | ICD-10-CM

## 2016-03-07 DIAGNOSIS — D631 Anemia in chronic kidney disease: Secondary | ICD-10-CM

## 2016-03-07 DIAGNOSIS — N183 Chronic kidney disease, stage 3 unspecified: Secondary | ICD-10-CM

## 2016-03-07 DIAGNOSIS — N181 Chronic kidney disease, stage 1: Principal | ICD-10-CM

## 2016-03-07 MED ORDER — SODIUM CHLORIDE 0.9 % IV SOLN
Freq: Once | INTRAVENOUS | Status: AC
  Administered 2016-03-07: 09:00:00 via INTRAVENOUS

## 2016-03-07 MED ORDER — FERUMOXYTOL INJECTION 510 MG/17 ML
510.0000 mg | Freq: Once | INTRAVENOUS | Status: AC
  Administered 2016-03-07: 510 mg via INTRAVENOUS
  Filled 2016-03-07: qty 17

## 2016-03-07 NOTE — Patient Instructions (Signed)
Ferumoxytol injection What is this medicine? FERUMOXYTOL is an iron complex. Iron is used to make healthy red blood cells, which carry oxygen and nutrients throughout the body. This medicine is used to treat iron deficiency anemia in people with chronic kidney disease. COMMON BRAND NAME(S): Feraheme What should I tell my health care provider before I take this medicine? They need to know if you have any of these conditions: -anemia not caused by low iron levels -high levels of iron in the blood -magnetic resonance imaging (MRI) test scheduled -an unusual or allergic reaction to iron, other medicines, foods, dyes, or preservatives -pregnant or trying to get pregnant -breast-feeding How should I use this medicine? This medicine is for injection into a vein. It is given by a health care professional in a hospital or clinic setting. Talk to your pediatrician regarding the use of this medicine in children. Special care may be needed. What if I miss a dose? It is important not to miss your dose. Call your doctor or health care professional if you are unable to keep an appointment. What may interact with this medicine? This medicine may interact with the following medications: -other iron products What should I watch for while using this medicine? Visit your doctor or healthcare professional regularly. Tell your doctor or healthcare professional if your symptoms do not start to get better or if they get worse. You may need blood work done while you are taking this medicine. You may need to follow a special diet. Talk to your doctor. Foods that contain iron include: whole grains/cereals, dried fruits, beans, or peas, leafy green vegetables, and organ meats (liver, kidney). What side effects may I notice from receiving this medicine? Side effects that you should report to your doctor or health care professional as soon as possible: -allergic reactions like skin rash, itching or hives, swelling of the  face, lips, or tongue -breathing problems -changes in blood pressure -feeling faint or lightheaded, falls -fever or chills -flushing, sweating, or hot feelings -swelling of the ankles or feet Side effects that usually do not require medical attention (report to your doctor or health care professional if they continue or are bothersome): -diarrhea -headache -nausea, vomiting -stomach pain Where should I keep my medicine? This drug is given in a hospital or clinic and will not be stored at home.  2017 Elsevier/Gold Standard (2015-02-22 12:41:49)  

## 2016-03-07 NOTE — Progress Notes (Signed)
Pt tolerated infusion well. Pt monitored 30 minutes post infusion. Pt and VS stable at discharge.  

## 2016-03-10 ENCOUNTER — Ambulatory Visit (INDEPENDENT_AMBULATORY_CARE_PROVIDER_SITE_OTHER): Payer: Medicaid Other | Admitting: Pharmacist

## 2016-03-10 ENCOUNTER — Encounter: Payer: Self-pay | Admitting: Pharmacist

## 2016-03-10 DIAGNOSIS — Z794 Long term (current) use of insulin: Secondary | ICD-10-CM

## 2016-03-10 DIAGNOSIS — E1122 Type 2 diabetes mellitus with diabetic chronic kidney disease: Secondary | ICD-10-CM | POA: Diagnosis not present

## 2016-03-10 DIAGNOSIS — I10 Essential (primary) hypertension: Secondary | ICD-10-CM

## 2016-03-10 DIAGNOSIS — N183 Chronic kidney disease, stage 3 (moderate): Secondary | ICD-10-CM

## 2016-03-10 DIAGNOSIS — E1121 Type 2 diabetes mellitus with diabetic nephropathy: Secondary | ICD-10-CM | POA: Diagnosis present

## 2016-03-10 DIAGNOSIS — J449 Chronic obstructive pulmonary disease, unspecified: Secondary | ICD-10-CM | POA: Diagnosis not present

## 2016-03-10 MED ORDER — NIFEDIPINE ER OSMOTIC RELEASE 60 MG PO TB24: 60.0000 mg | ORAL_TABLET | Freq: Two times a day (BID) | ORAL | 11 refills | Status: DC

## 2016-03-10 MED ORDER — ALBUTEROL SULFATE HFA 108 (90 BASE) MCG/ACT IN AERS: 2.0000 | INHALATION_SPRAY | Freq: Four times a day (QID) | RESPIRATORY_TRACT | 1 refills | Status: DC | PRN

## 2016-03-10 MED ORDER — INSULIN ASPART 100 UNIT/ML FLEXPEN: 12.0000 [IU] | PEN_INJECTOR | Freq: Three times a day (TID) | SUBCUTANEOUS | Status: DC

## 2016-03-10 NOTE — Assessment & Plan Note (Signed)
COPD - reviewed Spiriva Handihaler technique.  Instructed to use albuterol only PRN - refilled today as his device was broken.  He was congratulated on quitting tobacco 3 years ago and encouraged to continue abstinence.  He appears to be committed to long-term quitting of tobacco.  Low risk for relapse at this time.

## 2016-03-10 NOTE — Progress Notes (Signed)
Patient ID: Leonard [name], male   DOB: 12-31-1950, 66 y.o.   MRN: [id number] Reviewed: Agree with Dr. Graylin Shiver documentation and management.

## 2016-03-10 NOTE — Assessment & Plan Note (Signed)
Hypertension longstanding currently with a large blood pressure regimen including amlodipine and nifedipine.   Patient reports adherence with medication. Control is at goal today, edema was 1+ in peripheral extremity and weight was near "dry weight goal".  No change in regimen today.  Follow up with Renal physicians on Wed AM and PCP on Wed PM.  Encouraged to elevated lower legs > 3 x per day.

## 2016-03-10 NOTE — Assessment & Plan Note (Signed)
Diabetes longstanding currently with poor control due to erratic use of meal time insulin (Novolog).  Patient reports hypoglycemic events when using a large dose of novolog (35 units at 1 time).  He was able to verbalize an appropriate hypoglycemia management plan. Patient reports adherence with most medication through pill box assistance.  However he has been using his Novolog only for high readings. Control is suboptimal due to PRN dosing of Novolog. Continued basal insulin Lantus (insulin glargine) at 40 units QPM (patient prefers PM dosing).  Adjusted dose of rapid insulin Novolog (insulin aspart) to 12 units prior to each meal 3 times daily (only twice if he eats only two large meals).  Next A1C anticipated 3 months.   Refilled blood glucose test strips via phone call for QS 3X daily testing and requested he test and record 3x daily prior to meals.

## 2016-03-10 NOTE — Progress Notes (Signed)
    S:    Patient arrives in good spirits, walking without assistance, and a positive affect.  Presents for diabetes evaluation, education, and management at the request of Dr. Juanito Doom. Patient was referred on 03/04/2016.  Patient was last seen by Primary Care Provider on 03/04/2016.   Patient reports Diabetes was diagnosed many years ago.     Patient reports nonadherence with medications - specifically NOVolog.  Current diabetes medications include: Lantus 40 units Q PM, Novolog PRN Current hypertension medications include: amlodipine, nifedipine, clonidine, doxazosin, hydralazine, imdur, carvedilol, and torsemide.   Patient reports hypoglycemic event last night.   He reported a blood sugar of 550 for which he took Novolog 35 units which caused a blood sugar of 71 several hours later.   He ate some cookies and reports a blood sugar of 155 this AM.  (NO meter present at the visit today).   Patient reported dietary habits: Eats 3 meals/day   Patient reported exercise habits: limited to ADLs    Patient reports nocturia.  Patient reports self foot exams.  He has noticed some foot swelling worse in the PM and worse if he fails to elevate his legs during the day.   Patient was recently discharged from hospital and all medications have been reviewed.  O:  Lab Results  Component Value Date   HGBA1C 9.7 01/09/2016   Vitals:   03/10/16 1004  BP: (!) 132/54  Pulse: 71    Home fasting CBG: provided by patient 125, 134, 141, 186, 155 over the last several days.  2 hour post-prandial/random CBG: NOT testing at this time   A/P: Diabetes longstanding currently with poor control due to erratic use of meal time insulin (Novolog).  Patient reports hypoglycemic events when using a large dose of novolog (35 units at 1 time).  He was able to verbalize an appropriate hypoglycemia management plan. Patient reports adherence with most medication through pill box assistance.  However he has been using his  Novolog only for high readings. Control is suboptimal due to PRN dosing of Novolog. Continued basal insulin Lantus (insulin glargine) at 40 units QPM (patient prefers PM dosing).  Adjusted dose of rapid insulin Novolog (insulin aspart) to 12 units prior to each meal 3 times daily (only twice if he eats only two large meals).  Next A1C anticipated 3 months.   Refilled blood glucose test strips via phone call for QS 3X daily testing and requested he test and record 3x daily prior to meals.   ASCVD risk greater than 7.5%. Continued Aspirin 81 mg and Continued atorvastatin 40 mg.   Hypertension longstanding currently with a large blood pressure regimen including amlodipine and nifedipine.   Patient reports adherence with medication. Control is at goal today, edema was 1+ in peripheral extremity and weight was near "dry weight goal".  No change in regimen today.  Follow up with Renal physicians on Wed AM and PCP on Wed PM.   COPD - reviewed Spiriva Handihaler technique.  Instructed to use albuterol only PRN - refilled today as his device was broken.  He was congratulated on quitting tobacco 3 years ago and encouraged to continue abstinence.  He appears to be committed to long-term quitting of tobacco.  Low risk for relapse at this time.   Written patient instructions provided.  Total time in face to face counseling 35 minutes.   Follow up in Pharmacist Clinic Visit PRN per PCP Dr. Juanito Doom.

## 2016-03-10 NOTE — Patient Instructions (Signed)
Great to see you today.  Begin checking blood sugar readings prior to each meal (three times daily).   Continue LANTUS 40 units each evening.   START Novolog 12 units prior to meals - three times per day.   Call our office if your blood sugar readings are < 100  Next visit PER Dr. Juanito Doom on Wed.  No new visit scheduled.

## 2016-03-11 ENCOUNTER — Telehealth: Payer: Self-pay | Admitting: Family Medicine

## 2016-03-11 NOTE — Telephone Encounter (Signed)
Pts weight was 213.5.  His weight on Friday was 203.  There was no weight yesterday.  He had taken lasix twice a day last week but went back to his regular doses on Saturday.  Just wanted dr to know

## 2016-03-12 ENCOUNTER — Telehealth: Payer: Self-pay

## 2016-03-12 ENCOUNTER — Encounter: Payer: Self-pay | Admitting: Family Medicine

## 2016-03-12 ENCOUNTER — Ambulatory Visit (INDEPENDENT_AMBULATORY_CARE_PROVIDER_SITE_OTHER): Payer: Medicaid Other | Admitting: Family Medicine

## 2016-03-12 VITALS — BP 110/54 | HR 65 | Temp 97.8°F | Ht 68.0 in | Wt 218.6 lb

## 2016-03-12 DIAGNOSIS — I5032 Chronic diastolic (congestive) heart failure: Secondary | ICD-10-CM | POA: Diagnosis not present

## 2016-03-12 DIAGNOSIS — N183 Chronic kidney disease, stage 3 (moderate): Secondary | ICD-10-CM | POA: Diagnosis not present

## 2016-03-12 DIAGNOSIS — Z794 Long term (current) use of insulin: Secondary | ICD-10-CM

## 2016-03-12 DIAGNOSIS — N184 Chronic kidney disease, stage 4 (severe): Secondary | ICD-10-CM | POA: Diagnosis present

## 2016-03-12 DIAGNOSIS — E1122 Type 2 diabetes mellitus with diabetic chronic kidney disease: Secondary | ICD-10-CM | POA: Diagnosis not present

## 2016-03-12 NOTE — Telephone Encounter (Signed)
PA request received for Spiriva Respimat 2.5 mcg Called [employer] at [phone] to initiate PA.  This has been approved through 03/07/2017 Authorization #: [id number]  Pharmacy is aware of approval. Nothing further needed.

## 2016-03-12 NOTE — Assessment & Plan Note (Signed)
Controlled symptoms and exam at baseline. Weight close to baseline which appears to be around the 210-219 most recently. He stable 1+ pitting edema in bilateral lower extremities. Patient states he had his kidney blood work earlier today at his nephrologist and did not want repeat labs this afternoon.  -Continue current medication regimen -Return precautions discussed -Follow-up with cardiology in follow-up  in our office in one month

## 2016-03-12 NOTE — Assessment & Plan Note (Addendum)
Uncontrolled per last hemoglobin A1c of 9.7 in December 2017. Patient has been compliant with Lantus 40 units at night. He is only taking 12 units of NovoLog twice daily even though he specific take it 3 times daily. Discussed that if twice daily NovoLog works for him then that is okay with me. Diabetic foot exam performed today. Unable to palpate pulses in his known peripheral vascular disease and has been following with vascular specialists -Continue 40 units of Lantus at night -Continue 12 units of NovoLog twice daily with biggest meals a day -Check A1c in March 2018 -Continue to check glucose -Follow-up in one month

## 2016-03-12 NOTE — Progress Notes (Signed)
Subjective:    Patient ID: Leonard [name] , male   DOB: 13-Jul-1950 , 66 y.o..   MRN: [id number]  HPI  Leonard [name] is here for  Chief Complaint  Patient presents with  . Follow-up    diabetes    Chronic Diabetes  Disease Monitoring  Blood Sugar Ranges: 130's fasting  Polyuria: no   Visual problems: no   Last hemoglobin A1C:  Lab Results  Component Value Date   HGBA1C 9.7 01/09/2016    Medication Compliance: no. Has been taking 12 units of novolog twice a day, 40 of lantus at night.  Medication Side Effects  Hypoglycemia: no   Preventitive Health Care  Eye Exam: Up-to-date, recently had an exam and was told was normal  Foot Exam: Due  Diet pattern: Eats 3 meals a day, sometimes compliant with diabetic diet  Exercise: No exercise  CHF:  Is currently taking 4 torsemide pills a day (40 mg total). Took 60 mg  for 3 days weds, thurs, and Friday due to increased swelling. Denies any shortness of breath, chest pain, worsening leg edema. He feels like his leg swelling is at baseline. He is compliant with his medications.  Review of Systems: Per HPI. All other systems reviewed and are negative.  Past Medical History: Patient Active Problem List   Diagnosis Date Noted  . Anemia due to stage 4 chronic kidney disease treated with darbepoetin ([place]) 02/28/2016  . Fatigue 01/21/2016  . Diabetic retinopathy ([place]) 11/13/2015  . CHF (congestive heart failure) ([place]) 08/27/2015  . Iron deficiency anemia 08/03/2015  . Claudication ([place])   . Type 2 diabetes mellitus with stage 3 chronic kidney disease, with long-term current use of insulin ([place])   . Hematuria 06/27/2015  . Skin lesion of face 02/22/2015  . 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 Anemia 01/15/2015  . CKD (chronic kidney disease) stage 4, GFR 15-29 ml/min (H[facility]) 01/15/2015  . Hypothyroidism 11/22/2014  . COPD GOLD III  02/16/2014  . History of tobacco use 08/23/2013  . S/P CABG x 3  04/07/2013  . CAD (coronary artery disease) 04/06/2013  . PVD - hx of Rt SFA PTA and s/p Rt 4-5th toe amp Dec 2014 04/05/2013  . Essential hypertension 11/25/2012  . Mixed hyperlipidemia 07/17/2008    Medications: reviewed   Social Hx:  reports that he quit smoking about 3 years ago. His smoking use included Cigarettes. He started smoking about 49 years ago. He has a 46.00 pack-year smoking history. He has never used smokeless tobacco.   Objective:   BP (!) 110/54 (BP Location: Right Arm, Patient Position: Sitting, Cuff Size: Normal)   Pulse 65   Temp 97.8 F (36.6 C) (Oral)   Ht 5\' 8"  (1.727 m)   Wt 218 lb 9.6 oz (99.2 kg)   SpO2 95%   BMI 33.24 kg/m  Physical Exam  Gen: NAD, alert, cooperative with exam, well-appearing HEENT: NCAT, PERRL, clear conjunctiva, oropharynx clear, supple neck Cardiac: Regular rate and rhythm, normal S1/S2, no murmur, 1+ pitting edema from knees down bilaterally, capillary refill brisk. AV fistula palpable on left arm. Respiratory: Clear to auscultation bilaterally, no wheezes, non-labored breathing Skin: no rashes, normal turgor  Neurological: no gross deficits.  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 Small closed blister seen on calcaneal region of right foot. No erythema or induration. Pulses are nonpalpable. Bottom of feet are callused, poor sensation throughout.     Assessment & Plan:  CHF (congestive heart failure) (HCC) Controlled symptoms and exam at baseline. Weight close to baseline which appears to be around the 210-219 most recently. He stable 1+ pitting edema in bilateral lower extremities. Patient states he had his  kidney blood work earlier today at his nephrologist and did not want repeat labs this afternoon.  -Continue current medication regimen -Return precautions discussed -Follow-up with cardiology in follow-up  in our office in one month  Type 2 diabetes mellitus with stage 3 chronic kidney disease, with long-term current use of insulin ([place]) Uncontrolled per last hemoglobin A1c of 9.7 in December 2017. Patient has been compliant with Lantus 40 units at night. He is only taking 12 units of NovoLog twice daily even though he specific take it 3 times daily. Discussed that if twice daily NovoLog works for him then that is okay with me. Diabetic foot exam performed today. Unable to palpate pulses in his known peripheral vascular disease and has been following with vascular specialists -Continue 40 units of Lantus at night -Continue 12 units of NovoLog twice daily with biggest meals a day -Check A1c in March 2018 -Continue to check glucose -Follow-up in one month   Smitty Cords, MD [facility], PGY-2

## 2016-03-12 NOTE — Patient Instructions (Signed)
Thank you for coming in today, it was so nice to see you! Today we talked about:    Diabetes: You are doing well! Continue taking 40 units of Lantus every night and 12 units of novolog twice daily at your two larger meals of the day. Continue to check your sugar at least every morning before breakfast  Torsemide: Continue your current dose  Please follow up in 1 month. You can schedule this appointment at the front desk before you leave or call the clinic.  Bring in all your medications or supplements to each appointment for review.   If we ordered any tests today, you will be notified via telephone of any abnormalities. If everything is normal you will get a letter in the mail.   If you have any questions or concerns, please do not hesitate to call the office at [phone]. You can also message me directly via MyChart.   Sincerely,  Smitty Cords, MD

## 2016-03-13 ENCOUNTER — Other Ambulatory Visit (HOSPITAL_BASED_OUTPATIENT_CLINIC_OR_DEPARTMENT_OTHER): Payer: Medicaid Other

## 2016-03-13 ENCOUNTER — Ambulatory Visit (HOSPITAL_BASED_OUTPATIENT_CLINIC_OR_DEPARTMENT_OTHER): Payer: Medicaid Other

## 2016-03-13 VITALS — BP 122/53 | HR 60 | Resp 16

## 2016-03-13 DIAGNOSIS — D631 Anemia in chronic kidney disease: Secondary | ICD-10-CM | POA: Diagnosis not present

## 2016-03-13 DIAGNOSIS — N184 Chronic kidney disease, stage 4 (severe): Secondary | ICD-10-CM

## 2016-03-13 DIAGNOSIS — D509 Iron deficiency anemia, unspecified: Secondary | ICD-10-CM

## 2016-03-13 LAB — CBC WITH DIFFERENTIAL/PLATELET
BASO%: 0.4 % (ref 0.0–2.0)
Basophils Absolute: 0 10*3/uL (ref 0.0–0.1)
EOS ABS: 0.1 10*3/uL (ref 0.0–0.5)
EOS%: 2.7 % (ref 0.0–7.0)
HCT: 33.1 % — ABNORMAL LOW (ref 38.4–49.9)
HGB: 10.1 g/dL — ABNORMAL LOW (ref 13.0–17.1)
LYMPH%: 21.1 % (ref 14.0–49.0)
MCH: 23.7 pg — ABNORMAL LOW (ref 27.2–33.4)
MCHC: 30.5 g/dL — ABNORMAL LOW (ref 32.0–36.0)
MCV: 77.5 fL — ABNORMAL LOW (ref 79.3–98.0)
MONO#: 0.5 10*3/uL (ref 0.1–0.9)
MONO%: 9.5 % (ref 0.0–14.0)
NEUT%: 66.3 % (ref 39.0–75.0)
NEUTROS ABS: 3.4 10*3/uL (ref 1.5–6.5)
NRBC: 0 % (ref 0–0)
PLATELETS: 130 10*3/uL — AB (ref 140–400)
RBC: 4.27 10*6/uL (ref 4.20–5.82)
RDW: 22 % — AB (ref 11.0–14.6)
WBC: 5.2 10*3/uL (ref 4.0–10.3)
lymph#: 1.1 10*3/uL (ref 0.9–3.3)

## 2016-03-13 MED ORDER — DARBEPOETIN ALFA 500 MCG/ML IJ SOSY
500.0000 ug | PREFILLED_SYRINGE | Freq: Once | INTRAMUSCULAR | Status: AC
  Administered 2016-03-13: 500 ug via SUBCUTANEOUS
  Filled 2016-03-13: qty 1

## 2016-03-14 ENCOUNTER — Other Ambulatory Visit: Payer: Self-pay | Admitting: Internal Medicine

## 2016-03-14 DIAGNOSIS — R0600 Dyspnea, unspecified: Secondary | ICD-10-CM

## 2016-03-14 LAB — IRON AND TIBC
%SAT: 19 % — AB (ref 20–55)
IRON: 54 ug/dL (ref 42–163)
TIBC: 288 ug/dL (ref 202–409)
UIBC: 234 ug/dL (ref 117–376)

## 2016-03-14 LAB — FERRITIN: Ferritin: 352 ng/ml — ABNORMAL HIGH (ref 22–316)

## 2016-03-17 ENCOUNTER — Encounter: Payer: Self-pay | Admitting: Family

## 2016-03-17 ENCOUNTER — Ambulatory Visit (INDEPENDENT_AMBULATORY_CARE_PROVIDER_SITE_OTHER): Payer: Medicaid Other | Admitting: Internal Medicine

## 2016-03-17 ENCOUNTER — Encounter: Payer: Self-pay | Admitting: Internal Medicine

## 2016-03-17 VITALS — BP 126/70 | HR 67 | Ht 68.0 in | Wt 218.0 lb

## 2016-03-17 DIAGNOSIS — J449 Chronic obstructive pulmonary disease, unspecified: Secondary | ICD-10-CM

## 2016-03-17 DIAGNOSIS — R0600 Dyspnea, unspecified: Secondary | ICD-10-CM | POA: Diagnosis not present

## 2016-03-17 HISTORY — DX: Morbid (severe) obesity due to excess calories: E66.01

## 2016-03-17 LAB — PULMONARY FUNCTION TEST
DL/VA % PRED: 90 %
DL/VA: 4.04 ml/min/mmHg/L
DLCO COR: 15.38 ml/min/mmHg
DLCO UNC % PRED: 46 %
DLCO cor % pred: 51 %
DLCO unc: 13.85 ml/min/mmHg
FEF 25-75 PRE: 2.76 L/s
FEF 25-75 Post: 3.03 L/sec
FEF2575-%Change-Post: 9 %
FEF2575-%Pred-Post: 120 %
FEF2575-%Pred-Pre: 109 %
FEV1-%CHANGE-POST: 2 %
FEV1-%Pred-Post: 81 %
FEV1-%Pred-Pre: 78 %
FEV1-Post: 2.25 L
FEV1-Pre: 2.19 L
FEV1FVC-%Change-Post: 2 %
FEV1FVC-%PRED-PRE: 111 %
FEV6-%CHANGE-POST: 0 %
FEV6-%PRED-POST: 73 %
FEV6-%Pred-Pre: 73 %
FEV6-POST: 2.57 L
FEV6-Pre: 2.56 L
FEV6FVC-%PRED-POST: 104 %
FEV6FVC-%PRED-PRE: 104 %
FVC-%Change-Post: 0 %
FVC-%Pred-Post: 70 %
FVC-%Pred-Pre: 70 %
FVC-Post: 2.57 L
FVC-Pre: 2.56 L
PRE FEV6/FVC RATIO: 100 %
Post FEV1/FVC ratio: 88 %
Post FEV6/FVC ratio: 100 %
Pre FEV1/FVC ratio: 85 %

## 2016-03-17 NOTE — Assessment & Plan Note (Addendum)
Complicated by DM/ hbp  Body mass index is 33.15 kg/m.  trending up Lab Results  Component Value Date   TSH 4.808 (H) 01/22/2016     Contributing to gerd risk/ doe/reviewed the need and the process to achieve and maintain neg calorie balance > defer f/u primary care including intermittently monitoring thyroid status

## 2016-03-17 NOTE — Assessment & Plan Note (Addendum)
Quit smoking 2014  - PFT's  04/06/13   FEV1 1.28 (44 % ) ratio 48  p no % improvement from saba p ?  prior to study with DLCO  43/54 % corrects to 97  % for alv volume - 01/31/2016  After extensive coaching HFA effectiveness =    75% from baseline 0 > changed to sprivia respimat   - PFT's  03/17/2016  FEV1 2.25  (81 % ) ratio 88  p 2 % improvement from saba p spiriva respimat x 2  prior to study with DLCO  46/51c % corrects to 90  % for alv volume     03/17/2016  After extensive coaching device  effectiveness =    90% with hfa and respimat  I had an extended final summary discussion with the patient reviewing all relevant studies completed to date and  lasting 15 to 20 minutes of a 25 minute visit on the following issues:   1) When respiratory symptoms begin or become refractory well after a patient reports complete smoking cessation,  Especially when this wasn't the case while they were smoking, a red flag is raised based on the work of Dr Kris Mouton which states:  if you quit smoking when your best day FEV1 is still well preserved it is highly unlikely you will progress to severe disease.  That is to say, once the smoking stops,  the symptoms should not suddenly erupt or markedly worsen.  If so, the differential diagnosis should include  obesity/deconditioning,  LPR/Reflux/Aspiration syndromes,  occult CHF, or  especially side effect of medications commonly used in this population.    It appears he may have cardiac asthma or intermittent asthma but this is not copd and spiriva can be d/c'd  approp use of saba reviewed    I reviewed the Fletcher curve with the patient that basically indicates  if you quit smoking when your best day FEV1 is still well preserved (as is clearly  the case here)  it is highly unlikely you will progress to severe disease and informed the patient there was  no medication on the market that has proven to alter the curve/ its downward trajectory  or the likelihood of progression  of their disease(unlike other chronic medical conditions such as atheroclerosis where we do think we can change the natural hx with risk reducing meds)    Therefore stopping smoking and maintaining abstinence is the most important aspect of care, not choice of inhalers or for that matter, doctors.  Pulmonary f/u is prn

## 2016-03-17 NOTE — Progress Notes (Signed)
Subjective:     Patient ID: Leonard [name], male   DOB: 1950/06/14     MRN: [id number]  Brief patient profile:  31 yobm quit smoking 2014 and down hill since then s/p multiple admits for chf referred to pulmonary clinic 01/31/2016 by Dr   Juanito Doom in [facility] re COPE with GOLD III criteria 04/06/13    History of Present Illness  01/31/2016 1st [facility] office visit/ Layla Gramm   Chief Complaint  Patient presents with  . Pulmonary Consult    Referred by Hospitalist for eval of COPD. Pt denies any SOB today.    best days doe = MMRC2 = can't walk a nl pace on a flat grade s sob but does fine slow and flat eg shopping/ walking at mall  Min am cough and congestion worse in winter  No better on advair but not using consistently and technique very poor rec Plan A = Automatic = Spiriva 2 puffs each am   Plan B = Backup Only use your albuterol (PROVENTIL/yellow inhaler) as a rescue medication    03/17/2016  f/u ov/[facility] re: GOLD 0 copd/ on maint spiriva  Chief Complaint  Patient presents with  . Follow-up    PFT's done today. Breathing is doing well and no co's today.      Not limited by breathing from desired activities  No needing saba     No obvious day to day or daytime variability or assoc excess/ purulent sputum or mucus plugs or hemoptysis or cp or chest tightness, subjective wheeze or overt sinus or hb symptoms. No unusual exp hx or h/o childhood pna/ asthma or knowledge of premature birth.  Sleeping ok without nocturnal  or early am exacerbation  of respiratory  c/o's or need for noct saba. Also denies any obvious fluctuation of symptoms with weather or environmental changes or other aggravating or alleviating factors except as outlined above   Current Medications, Allergies, Complete Past Medical History, Past Surgical History, Family History, and Social History were reviewed in [employer] record.  ROS  The following are not active complaints unless  bolded sore throat, dysphagia, dental problems, itching, sneezing,  nasal congestion or excess/ purulent secretions, ear ache,   fever, chills, sweats, unintended wt loss, classically pleuritic or exertional cp,  orthopnea pnd or leg swelling, presyncope, palpitations, abdominal pain, anorexia, nausea, vomiting, diarrhea  or change in bowel or bladder habits, change in stools or urine, dysuria,hematuria,  rash, arthralgias, visual complaints, headache, numbness, weakness or ataxia or problems with walking or coordination,  change in mood/affect or memory.               Objective:   Physical Exam     amb stoic bm nad      03/17/2016       218    01/31/16 214 lb (97.1 kg)  01/24/16 202 lb 8 oz (91.9 kg)  01/18/16 215 lb 13.3 oz (97.9 kg)    Vital signs reviewed      HEENT: nl dentition, turbinates, and oropharynx. Nl external ear canals without cough reflex   NECK :  without JVD/Nodes/TM/ nl carotid upstrokes bilaterally   LUNGS: no acc muscle use,  slt barrel contour with distant bs, no wheeze at all    CV:  RRR  no s3 or murmur or increase in P2, nad no edema   ABD:  soft and nontender with nl inspiratory excursion in the supine position. No bruits or organomegaly appreciated, bowel sounds  nl  MS:  Nl gait/ ext warm without deformities, calf tenderness, cyanosis or clubbing No obvious joint restrictions   SKIN: warm and dry without lesions    NEURO:  alert, approp, nl sensorium with  no motor or cerebellar deficits apparent.     I personally reviewed images and agree with radiology impression as follows:  CXR:   01/21/16 Cardiomegaly with mild to moderate diffuse pulmonary edema, suggesting acute CHF exacerbation.     Assessment:

## 2016-03-17 NOTE — Patient Instructions (Signed)
Only use your albuterol as a rescue medication to be used if you can't catch your breath by resting or doing a relaxed purse lip breathing pattern.  - The less you use it, the better it will work when you need it. - Ok to use up to 2 puffs  every 4 hours if you must but call for immediate appointment if use goes up over your usual need - Don't leave home without it !!  (think of it like the spare tire for your car)   You do not have significant copd and you do need the spiriva so ok to stop it and return here as needed

## 2016-03-18 ENCOUNTER — Other Ambulatory Visit (HOSPITAL_COMMUNITY): Payer: Self-pay

## 2016-03-18 NOTE — Progress Notes (Signed)
Paramedicine Encounter    Patient ID: Leonard [name], male    DOB: 11/24/1950, 66 y.o.   MRN: [id number]   Patient Care Team: [name], MD as PCP - General (Family Medicine) [name], MD as Consulting Physician (Nephrology)  Patient Active Problem List   Diagnosis Date Noted  . Morbid obesity due to excess calories ([place]) 03/17/2016  . Anemia due to stage 4 chronic kidney disease treated with darbepoetin ([place]) 02/28/2016  . Fatigue 01/21/2016  . Diabetic retinopathy ([place]) 11/13/2015  . CHF (congestive heart failure) ([place]) 08/27/2015  . Iron deficiency anemia 08/03/2015  . Claudication ([place])   . Type 2 diabetes mellitus with stage 3 chronic kidney disease, with long-term current use of insulin ([place])   . Hematuria 06/27/2015  . Skin lesion of face 02/22/2015  . 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 Anemia 01/15/2015  . CKD (chronic kidney disease) stage 4, GFR 15-29 ml/min (HCC) 01/15/2015  . Hypothyroidism 11/22/2014  . COPD GOLD III  02/16/2014  . History of tobacco use 08/23/2013  . 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 CAD (coronary artery disease) 04/06/2013  . PVD - hx of Rt SFA PTA and s/p Rt 4-5th toe amp Dec 2014 04/05/2013  . Essential hypertension 11/25/2012  . Mixed hyperlipidemia 07/17/2008    Current Outpatient Prescriptions:  .  albuterol (PROVENTIL HFA) 108 (90 Base) MCG/ACT inhaler, Inhale 2 puffs into the lungs every 6 (six) hours as needed for wheezing or shortness of breath., Disp: 6.7 g, Rfl: 1 .  amLODipine (NORVASC) 10 MG tablet, Take 1 tablet (10 mg total) by mouth daily., Disp: 30 tablet, Rfl: 6 .  aspirin 81 MG tablet, TAKE 81 MG BY MOUTH EVERY DAY, Disp: 30 tablet, Rfl: 3 .  atorvastatin (LIPITOR) 40 MG tablet, TAKE 1 TABLET BY MOUTH EVERY DAY AT 6 PM (Patient taking differently: Take 40 mg by mouth daily at 6 PM. ), Disp: 90 tablet, Rfl: 3 .  carvedilol (COREG) 25 MG tablet, Take 2 tablets (50 mg total) by mouth 2  (two) times daily with a meal., Disp: 120 tablet, Rfl: 3 .  cloNIDine (CATAPRES) 0.2 MG tablet, TAKE ONE (1) TABLET BY MOUTH TWO (2) TIMES DAILY, Disp: 30 tablet, Rfl: 1 .  doxazosin (CARDURA) 8 MG tablet, Take 1 tablet (8 mg total) by mouth 2 (two) times daily., Disp: 60 tablet, Rfl: 6 .  ferrous sulfate (FERROUSUL) 325 (65 FE) MG tablet, Take 1 tablet (325 mg total) by mouth 2 (two) times daily with a meal., Disp: 60 tablet, Rfl: 3 .  hydrALAZINE (APRESOLINE) 100 MG tablet, TAKE ONE TABLET BY MOUTH EVERY EIGHT HOURS (Patient taking differently: Take 100 mg by mouth every 8 (eight) hours. ), Disp: 90 tablet, Rfl: 3 .  insulin aspart (NOVOLOG FLEXPEN) 100 UNIT/ML FlexPen, Inject 12 Units into the skin 3 (three) times daily with meals., Disp: , Rfl:  .  Insulin Glargine (LANTUS SOLOSTAR) 100 UNIT/ML Solostar Pen, Inject 40 Units into the skin at bedtime., Disp: , Rfl:  .  isosorbide mononitrate (IMDUR) 60 MG 24 hr tablet, Take 60 mg by mouth daily., Disp: , Rfl:  .  levothyroxine (SYNTHROID, LEVOTHROID) 50 MCG tablet, TAKE ONE (1) TABLET BY MOUTH EVERY DAY BEFORE BREAKFAST, Disp: 30 tablet, Rfl: 3 .  NIFEdipine (PROCARDIA XL/ADALAT-CC) 60 MG 24 hr tablet, Take 1 tablet (60 mg total) by mouth 2 (two) times daily., Disp: 60 tablet, Rfl: 11 .  nitroGLYCERIN (NITROSTAT) 0.4 MG SL  tablet, Place 1 tablet (0.4 mg total) under the tongue every 5 (five) minutes as needed for chest pain., Disp: 30 tablet, Rfl: 12 .  pantoprazole (PROTONIX) 40 MG tablet, TAKE 1 TABLET BY MOUTH DAILY (Patient taking differently: TAKE 40 MG BY MOUTH DAILY), Disp: 30 tablet, Rfl: 3 .  potassium chloride SA (K-DUR,KLOR-CON) 20 MEQ tablet, Take 53meq (1 tablet ) in the AM and 34meq (1/2 tablet) in the PM (Patient taking differently: Take 10-20 mEq by mouth See admin instructions. Take 45meq (1 tablet ) in the AM and 61meq (1/2 tablet) in the PM), Disp: 135 tablet, Rfl: 3 .  Tiotropium Bromide Monohydrate (SPIRIVA RESPIMAT) 2.5 MCG/ACT  AERS, Inhale 2 puffs into the lungs daily., Disp: 1 Inhaler, Rfl: 0 .  torsemide (DEMADEX) 20 MG tablet, Take 2 tablets (40 mg total) by mouth 2 (two) times daily. May take an extra 20 mg Daily for weight gain above 212 lbs, Disp: 130 tablet, Rfl: 3 Allergies  Allergen Reactions  . No Known Allergies      Social History   Social History  . Marital status: Single    Spouse name: N/A  . Number of children: N/A  . Years of education: N/A   Occupational History  . Not on file.   Social History Main Topics  . Smoking status: Former Smoker    Packs/day: 1.00    Years: 46.00    Types: Cigarettes    Start date: 02/04/1967    Quit date: 02/03/2013  . Smokeless tobacco: Never Used     Comment: Doing well with quitting.  . Alcohol use No  . Drug use: No  . Sexual activity: Not Currently   Other Topics Concern  . Not on file   Social History Narrative   Lives with sister, [name] in [place].  Retired - former Sports coach.    Physical Exam  Eyes: Pupils are equal, round, and reactive to light.  Cardiovascular: Normal rate.   Pulmonary/Chest: No respiratory distress. He has no wheezes. He has no rales.  Abdominal: He exhibits distension. He exhibits no mass. There is no tenderness. There is no rebound and no guarding.  Musculoskeletal: He exhibits edema.  Skin: Skin is warm and dry. He is not diaphoretic.        SAFE - 03/18/16 1200      Situation   Admitting diagnosis chf   Heart failure history Exisiting   Comorbidities Anemia;Atrial fibillation;CKD/renal insufficiency;COPD;DM;HTN   Readmitted within 30 days No   Hospital admission within past 12 months Yes   number of hospital admissions 4   number of ED visits 4   Target Weight 212 lbs     Assessment   Lives alone Yes   Primary support person sister: betty Sturdivant   Mode of transportation other   Other services involved Other  Home aide (bathe, clean)   [place];Wheelchair     [employer]   Weighs  self daily Yes   Scale provided Yes   Records on weight chart No     Resources   Has "Living better w/heart failure" book Yes   Has HF Zone tool Yes   Able to identify yellow zone signs/when to call MD Yes   Records zone daily Yes     Medications   Uses a pill box Yes   Who stocks the pill box --  [facility]   Pill box checked this visit Yes   Pill box refilled this visit Yes  Difficulty obtaining medications No   Mail order medications Yes   New medications at home today No   Next scheduled delivery of medications --  no refills needed   Missed one or more doses of medications per week Yes   How many missed doses this week 6     Nutrition   Patient receives meals on wheels No   Patient follows low sodium diet No   Has foods at home that meet the current recommended diet No   Patient follows low sugar/card diet No   Nutritional concerns/issues --  pt refuses to eat low sodium diet     Activity Level   ADL's/Mobility Independent   How many feet can patient ambulate --  200   Typical activity level --  normal   Barriers --  none   Actvity tolerance: NYHA Class 3     Urine   Difficulty urinating No   Changes in urine None     Time spent with patient   Time spent with patient  [place] - 03/18/16 1200      Outside of House   Sidewalk and pathway to house is level and free from any hazards Yes   Driveway is free from debris/snow/ice Yes   Outside stairs are stable and have sturdy handrail N/A   Porch lights are working and provide adequate lighting Yes     Living Room   Furniture is of adequate height and offers arm rests that assist in getting up and down Yes   Floor is free from any clutter that would create tripping hazards Yes   All cords are either behind furniture or secured in a manner that does not cause trip hazards Yes   All rugs are secured to floor with double-sided tape Yes   Lighting is adequate to  light room Yes   All lighting has an easily accessible on/off switch Yes   Phone is readily accessible near favorite seating areas Yes   Emergency numbers are printed near all phones in house Yes     Kitchen   Items used most often are within easy reach on low shelves Yes   Step stool is present, is sturdy and has a handrail N/A   Floor mats are non-slip tread and secured to floor Yes   Oven controls are within easy reach Yes   Kitchen lighting is adequate and easy to reach switches Yes   ABC fire extinguisher is located in kitchen Yes     [place] is properly secured to stairs and/or all wood is properly secured No   Handrail is present and sturdy N/A   Stairs are free from any clutter Yes   Stairway is adequately lit N/A     Bathroom   Tub and shower have a non-slip surface No   Tub and/or shower have a grab bar for stability No   Toilet has a raised seat No   Grab bar is attached near toilet for assistance Yes   Pathway from bedroom to bathroom is free from clutter and well lit for ease of movement in the middle of the night Yes     Bedroom   Floor is free from clutter Yes   Light is near bed and is easy to turn on Yes   Phone is next to bed and within easy reach Yes   Flashlight is near bed in case  of emergency No     General   Smoke detectors in all areas of the house (each floor) and tested Yes   CO detectors on each floor of house and tested Yes   Flashlights are handy throughout the home No   Resident has all medical information readily available and in an area emergency providers will easily find Yes   All heaters are away from any type of flammable material Yes     Overall Tips   Homeowner ha good non-skid shoes to move around house Yes   All assisted walking devices are readily accessible and in good condition Yes   There is a phone near the floor for ease of reach in case of a fall YES   All O2 tubing is less than 50 ft. and is not a trip hazard N/A    Resident has had an annual hearing and vision check by a physician Yes   Resident has the proper hearing and visual aids prescribed and are in good working order N/A   All medications are properly stored and labeled to avoid confusion on dosage, time to take, and avoidance of missed doses Yes       Future Appointments Date Time Provider [place]  03/24/2016 10:45 AM Conrad [name], MD [facility]  03/27/2016 2:45 PM [facility] None  03/27/2016 3:15 PM [facility] INJ NURSE [facility] None  04/10/2016 2:45 PM [facility] None  04/10/2016 3:15 PM [facility] INJ NURSE [facility] None  04/11/2016 10:45 AM Gardiner Barefoot, DPM [facility]  04/18/2016 1:30 PM [name], MD [facility] [place]  04/24/2016 2:45 PM [facility] None  04/24/2016 3:15 PM [facility] INJ NURSE [facility] None  05/08/2016 2:45 PM [facility] None  05/08/2016 3:15 PM [facility] INJ NURSE [facility] None  05/22/2016 10:30 AM [facility] LAB 4 [facility] None  05/22/2016 11:15 AM Heath Lark, MD [facility] None  05/22/2016 11:45 AM [facility] INJ NURSE [facility] None  06/13/2016 11:00 AM [facility] 3 [facility]  06/13/2016 11:45 AM Sharmon Leyden Nickel, NP [facility]     ACTION: Home visit completed Next visit planned for next tues    ATF pt CAO x4 in home visit. Pt denies SOB, nausea, headaches, dizziness, and chest pain.  Pt missed 6 medication dosages in the past week.  Pt could not give [facility] a reason why he missed rx.  Pt also has edema noted to both feet and ankles. CBG 187.  Heart clinic called due to pt's increase edema in feet and legs.   Today's weight: 212.5 Yesterday's weight: 209 Last visit: 213.5

## 2016-03-20 ENCOUNTER — Other Ambulatory Visit (HOSPITAL_COMMUNITY): Payer: Self-pay

## 2016-03-20 NOTE — Progress Notes (Signed)
Paramedicine Encounter    Patient ID: Leonard [name], male    DOB: 24-Feb-1950, 66 y.o.   MRN: [id number]   Patient Care Team: [name], MD as PCP - General (Family Medicine) [name], MD as Consulting Physician (Nephrology)  Patient Active Problem List   Diagnosis Date Noted  . Morbid obesity due to excess calories ([place]) 03/17/2016  . Anemia due to stage 4 chronic kidney disease treated with darbepoetin ([place]) 02/28/2016  . Fatigue 01/21/2016  . Diabetic retinopathy ([place]) 11/13/2015  . CHF (congestive heart failure) ([place]) 08/27/2015  . Iron deficiency anemia 08/03/2015  . Claudication ([place])   . Type 2 diabetes mellitus with stage 3 chronic kidney disease, with long-term current use of insulin ([place])   . Hematuria 06/27/2015  . Skin lesion of face 02/22/2015  . 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 Anemia 01/15/2015  . CKD (chronic kidney disease) stage 4, GFR 15-29 ml/min ([facility]) 01/15/2015  . Hypothyroidism 11/22/2014  . COPD GOLD III  02/16/2014  . History of tobacco use 08/23/2013  . 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 CAD (coronary artery disease) 04/06/2013  . PVD - hx of Rt SFA PTA and s/p Rt 4-5th toe amp Dec 2014 04/05/2013  . Essential hypertension 11/25/2012  . Mixed hyperlipidemia 07/17/2008    Current Outpatient Prescriptions:  .  albuterol (PROVENTIL HFA) 108 (90 Base) MCG/ACT inhaler, Inhale 2 puffs into the lungs every 6 (six) hours as needed for wheezing or shortness of breath., Disp: 6.7 g, Rfl: 1 .  amLODipine (NORVASC) 10 MG tablet, Take 1 tablet (10 mg total) by mouth daily., Disp: 30 tablet, Rfl: 6 .  aspirin 81 MG tablet, TAKE 81 MG BY MOUTH EVERY DAY, Disp: 30 tablet, Rfl: 3 .  atorvastatin (LIPITOR) 40 MG tablet, TAKE 1 TABLET BY MOUTH EVERY DAY AT 6 PM (Patient taking differently: Take 40 mg by mouth daily at 6 PM. ), Disp: 90 tablet, Rfl: 3 .  carvedilol (COREG) 25 MG tablet, Take 2 tablets (50 mg total) by mouth 2  (two) times daily with a meal., Disp: 120 tablet, Rfl: 3 .  cloNIDine (CATAPRES) 0.2 MG tablet, TAKE ONE (1) TABLET BY MOUTH TWO (2) TIMES DAILY, Disp: 30 tablet, Rfl: 1 .  doxazosin (CARDURA) 8 MG tablet, Take 1 tablet (8 mg total) by mouth 2 (two) times daily., Disp: 60 tablet, Rfl: 6 .  ferrous sulfate (FERROUSUL) 325 (65 FE) MG tablet, Take 1 tablet (325 mg total) by mouth 2 (two) times daily with a meal., Disp: 60 tablet, Rfl: 3 .  hydrALAZINE (APRESOLINE) 100 MG tablet, TAKE ONE TABLET BY MOUTH EVERY EIGHT HOURS (Patient taking differently: Take 100 mg by mouth every 8 (eight) hours. ), Disp: 90 tablet, Rfl: 3 .  insulin aspart (NOVOLOG FLEXPEN) 100 UNIT/ML FlexPen, Inject 12 Units into the skin 3 (three) times daily with meals., Disp: , Rfl:  .  Insulin Glargine (LANTUS SOLOSTAR) 100 UNIT/ML Solostar Pen, Inject 40 Units into the skin at bedtime., Disp: , Rfl:  .  isosorbide mononitrate (IMDUR) 60 MG 24 hr tablet, Take 60 mg by mouth daily., Disp: , Rfl:  .  levothyroxine (SYNTHROID, LEVOTHROID) 50 MCG tablet, TAKE ONE (1) TABLET BY MOUTH EVERY DAY BEFORE BREAKFAST, Disp: 30 tablet, Rfl: 3 .  NIFEdipine (PROCARDIA XL/ADALAT-CC) 60 MG 24 hr tablet, Take 1 tablet (60 mg total) by mouth 2 (two) times daily., Disp: 60 tablet, Rfl: 11 .  nitroGLYCERIN (NITROSTAT) 0.4 MG SL  tablet, Place 1 tablet (0.4 mg total) under the tongue every 5 (five) minutes as needed for chest pain., Disp: 30 tablet, Rfl: 12 .  pantoprazole (PROTONIX) 40 MG tablet, TAKE 1 TABLET BY MOUTH DAILY (Patient taking differently: TAKE 40 MG BY MOUTH DAILY), Disp: 30 tablet, Rfl: 3 .  potassium chloride SA (K-DUR,KLOR-CON) 20 MEQ tablet, Take 26meq (1 tablet ) in the AM and 3meq (1/2 tablet) in the PM (Patient taking differently: Take 10-20 mEq by mouth See admin instructions. Take 83meq (1 tablet ) in the AM and 84meq (1/2 tablet) in the PM), Disp: 135 tablet, Rfl: 3 .  Tiotropium Bromide Monohydrate (SPIRIVA RESPIMAT) 2.5 MCG/ACT  AERS, Inhale 2 puffs into the lungs daily., Disp: 1 Inhaler, Rfl: 0 .  torsemide (DEMADEX) 20 MG tablet, Take 2 tablets (40 mg total) by mouth 2 (two) times daily. May take an extra 20 mg Daily for weight gain above 212 lbs, Disp: 130 tablet, Rfl: 3 Allergies  Allergen Reactions  . No Known Allergies      Social History   Social History  . Marital status: Single    Spouse name: N/A  . Number of children: N/A  . Years of education: N/A   Occupational History  . Not on file.   Social History Main Topics  . Smoking status: Former Smoker    Packs/day: 1.00    Years: 46.00    Types: Cigarettes    Start date: 02/04/1967    Quit date: 02/03/2013  . Smokeless tobacco: Never Used     Comment: Doing well with quitting.  . Alcohol use No  . Drug use: No  . Sexual activity: Not Currently   Other Topics Concern  . Not on file   Social History Narrative   Lives with sister, [name] in [place].  Retired - former Sports coach.    Physical Exam  Constitutional: No distress.  Eyes: Pupils are equal, round, and reactive to light.  Cardiovascular: Normal rate.   Pulmonary/Chest: No respiratory distress. He has no wheezes. He has no rales.  Abdominal: He exhibits no distension.  Skin: Skin is warm and dry. He is not diaphoretic.        [place]FE - 03/18/16 1200      Situation   Admitting diagnosis chf   Heart failure history Exisiting   Comorbidities Anemia;Atrial fibillation;CKD/renal insufficiency;COPD;DM;HTN   Readmitted within 30 days No   Hospital admission within past 12 months Yes   number of hospital admissions 4   number of ED visits 4   Target Weight 212 lbs     Assessment   Lives alone Yes   Primary support person sister: betty Wachs   Mode of transportation other   Other services involved Other  Home aide (bathe, clean)   [place];Wheelchair     [employer]   Weighs self daily Yes   Scale provided Yes   Records on weight chart No     Resources    Has "Living better w/heart failure" book Yes   Has HF Zone tool Yes   Able to identify yellow zone signs/when to call MD Yes   Records zone daily Yes     Medications   Uses a pill box Yes   Who stocks the pill box --  [facility]   Pill box checked this visit Yes   Pill box refilled this visit Yes   Difficulty obtaining medications No   Mail order medications Yes   New medications at  home today No   Next scheduled delivery of medications --  no refills needed   Missed one or more doses of medications per week Yes   How many missed doses this week 6     Nutrition   Patient receives meals on wheels No   Patient follows low sodium diet No   Has foods at home that meet the current recommended diet No   Patient follows low sugar/card diet No   Nutritional concerns/issues --  pt refuses to eat low sodium diet     Activity Level   ADL's/Mobility Independent   How many feet can patient ambulate --  200   Typical activity level --  normal   Barriers --  none   Actvity tolerance: NYHA Class 3     Urine   Difficulty urinating No   Changes in urine None     Time spent with patient   Time spent with patient  [place] - 03/18/16 1200      Outside of House   Sidewalk and pathway to house is level and free from any hazards Yes   Driveway is free from debris/snow/ice Yes   Outside stairs are stable and have sturdy handrail N/A   Porch lights are working and provide adequate lighting Yes     Living Room   Furniture is of adequate height and offers arm rests that assist in getting up and down Yes   Floor is free from any clutter that would create tripping hazards Yes   All cords are either behind furniture or secured in a manner that does not cause trip hazards Yes   All rugs are secured to floor with double-sided tape Yes   Lighting is adequate to light room Yes   All lighting has an easily accessible on/off switch Yes   Phone is  readily accessible near favorite seating areas Yes   Emergency numbers are printed near all phones in house Yes     Kitchen   Items used most often are within easy reach on low shelves Yes   Step stool is present, is sturdy and has a handrail N/A   Floor mats are non-slip tread and secured to floor Yes   Oven controls are within easy reach Yes   Kitchen lighting is adequate and easy to reach switches Yes   ABC fire extinguisher is located in kitchen Yes     [place] is properly secured to stairs and/or all wood is properly secured No   Handrail is present and sturdy N/A   Stairs are free from any clutter Yes   Stairway is adequately lit N/A     Bathroom   Tub and shower have a non-slip surface No   Tub and/or shower have a grab bar for stability No   Toilet has a raised seat No   Grab bar is attached near toilet for assistance Yes   Pathway from bedroom to bathroom is free from clutter and well lit for ease of movement in the middle of the night Yes     Bedroom   Floor is free from clutter Yes   Light is near bed and is easy to turn on Yes   Phone is next to bed and within easy reach Yes   Flashlight is near bed in case of emergency No     General   Smoke detectors in all areas  of the house (each floor) and tested Yes   CO detectors on each floor of house and tested Yes   Flashlights are handy throughout the home No   Resident has all medical information readily available and in an area emergency providers will easily find Yes   All heaters are away from any type of flammable material Yes     Overall Tips   Homeowner ha good non-skid shoes to move around house Yes   All assisted walking devices are readily accessible and in good condition Yes   There is a phone near the floor for ease of reach in case of a fall YES   All O2 tubing is less than 50 ft. and is not a trip hazard N/A   Resident has had an annual hearing and vision check by a physician Yes   Resident has  the proper hearing and visual aids prescribed and are in good working order N/A   All medications are properly stored and labeled to avoid confusion on dosage, time to take, and avoidance of missed doses Yes       Future Appointments Date Time Provider [place]  03/24/2016 10:45 AM Conrad [place], MD [facility]  03/27/2016 2:45 PM [facility] None  03/27/2016 3:15 PM [facility] INJ NURSE [facility] None  04/10/2016 2:45 PM [facility] None  04/10/2016 3:15 PM [facility] INJ NURSE [facility] None  04/11/2016 10:45 AM Gardiner Barefoot, DPM [facility]  04/18/2016 1:30 PM [name], MD [facility] [place]  04/24/2016 2:45 PM [facility] None  04/24/2016 3:15 PM [facility] INJ NURSE [facility] None  05/08/2016 2:45 PM [facility] None  05/08/2016 3:15 PM [facility] INJ NURSE [facility] None  05/22/2016 10:30 AM [facility] LAB 4 [facility] None  05/22/2016 11:15 AM Heath Lark, MD [facility] None  05/22/2016 11:45 AM [facility] INJ NURSE [facility] None  06/13/2016 11:00 AM [facility] 3 [facility]  06/13/2016 11:45 AM Sharmon Leyden Nickel, NP [facility]   RE-VISIT [facility] re-visit due to pt receiving medications in the mail and pill box needed to be revised.  Pt denies SOB, dizziness, headaches and chest pain. Pt's vitals noted. rx bottles and pill box verified. Pt has not missed any rx since [facility] last visit.   BP (!) 110/50 (BP Location: Right Arm, Patient Position: Sitting, Cuff Size: Normal)   Pulse 64   Resp 16   Wt 212 lb (96.2 kg)   SpO2 94%   BMI 32.23 kg/m   Weight yesterday-212 Last visit weight-212.5    ACTION: Home visit completed

## 2016-03-21 ENCOUNTER — Telehealth: Payer: Self-pay | Admitting: Family Medicine

## 2016-03-21 ENCOUNTER — Inpatient Hospital Stay (HOSPITAL_COMMUNITY)
Admission: EM | Admit: 2016-03-21 | Discharge: 2016-03-24 | DRG: 291 | Disposition: A | Discharge status: Home / Self Care | Payer: Medicaid Other | Attending: Family Medicine | Admitting: Family Medicine

## 2016-03-21 ENCOUNTER — Other Ambulatory Visit: Payer: Self-pay

## 2016-03-21 ENCOUNTER — Emergency Department (HOSPITAL_COMMUNITY): Payer: Medicaid Other

## 2016-03-21 ENCOUNTER — Encounter (HOSPITAL_COMMUNITY): Payer: Self-pay | Admitting: Emergency Medicine

## 2016-03-21 DIAGNOSIS — Z6832 Body mass index (BMI) 32.0-32.9, adult: Secondary | ICD-10-CM | POA: Diagnosis not present

## 2016-03-21 DIAGNOSIS — J90 Pleural effusion, not elsewhere classified: Secondary | ICD-10-CM | POA: Diagnosis not present

## 2016-03-21 DIAGNOSIS — Z8249 Family history of ischemic heart disease and other diseases of the circulatory system: Secondary | ICD-10-CM

## 2016-03-21 DIAGNOSIS — Z951 Presence of aortocoronary bypass graft: Secondary | ICD-10-CM

## 2016-03-21 DIAGNOSIS — J449 Chronic obstructive pulmonary disease, unspecified: Secondary | ICD-10-CM | POA: Diagnosis present

## 2016-03-21 DIAGNOSIS — Z7982 Long term (current) use of aspirin: Secondary | ICD-10-CM | POA: Diagnosis not present

## 2016-03-21 DIAGNOSIS — Z85828 Personal history of other malignant neoplasm of skin: Secondary | ICD-10-CM | POA: Diagnosis not present

## 2016-03-21 DIAGNOSIS — Z87891 Personal history of nicotine dependence: Secondary | ICD-10-CM

## 2016-03-21 DIAGNOSIS — N185 Chronic kidney disease, stage 5: Secondary | ICD-10-CM | POA: Diagnosis present

## 2016-03-21 DIAGNOSIS — I5033 Acute on chronic diastolic (congestive) heart failure: Secondary | ICD-10-CM | POA: Diagnosis present

## 2016-03-21 DIAGNOSIS — J4520 Mild intermittent asthma, uncomplicated: Secondary | ICD-10-CM | POA: Diagnosis present

## 2016-03-21 DIAGNOSIS — I2510 Atherosclerotic heart disease of native coronary artery without angina pectoris: Secondary | ICD-10-CM | POA: Diagnosis present

## 2016-03-21 DIAGNOSIS — Z801 Family history of malignant neoplasm of trachea, bronchus and lung: Secondary | ICD-10-CM | POA: Diagnosis not present

## 2016-03-21 DIAGNOSIS — Z79899 Other long term (current) drug therapy: Secondary | ICD-10-CM

## 2016-03-21 DIAGNOSIS — R0602 Shortness of breath: Secondary | ICD-10-CM | POA: Diagnosis present

## 2016-03-21 DIAGNOSIS — E1122 Type 2 diabetes mellitus with diabetic chronic kidney disease: Secondary | ICD-10-CM | POA: Diagnosis present

## 2016-03-21 DIAGNOSIS — D509 Iron deficiency anemia, unspecified: Secondary | ICD-10-CM | POA: Diagnosis present

## 2016-03-21 DIAGNOSIS — E11319 Type 2 diabetes mellitus with unspecified diabetic retinopathy without macular edema: Secondary | ICD-10-CM | POA: Diagnosis present

## 2016-03-21 DIAGNOSIS — I159 Secondary hypertension, unspecified: Secondary | ICD-10-CM

## 2016-03-21 DIAGNOSIS — I132 Hypertensive heart and chronic kidney disease with heart failure and with stage 5 chronic kidney disease, or end stage renal disease: Principal | ICD-10-CM | POA: Diagnosis present

## 2016-03-21 DIAGNOSIS — R0902 Hypoxemia: Secondary | ICD-10-CM

## 2016-03-21 DIAGNOSIS — Z794 Long term (current) use of insulin: Secondary | ICD-10-CM

## 2016-03-21 DIAGNOSIS — E039 Hypothyroidism, unspecified: Secondary | ICD-10-CM | POA: Diagnosis present

## 2016-03-21 DIAGNOSIS — I509 Heart failure, unspecified: Secondary | ICD-10-CM

## 2016-03-21 LAB — CBC
HCT: 33.9 % — ABNORMAL LOW (ref 39.0–52.0)
Hemoglobin: 10.5 g/dL — ABNORMAL LOW (ref 13.0–17.0)
MCH: 23.7 pg — AB (ref 26.0–34.0)
MCHC: 31 g/dL (ref 30.0–36.0)
MCV: 76.5 fL — AB (ref 78.0–100.0)
PLATELETS: 171 10*3/uL (ref 150–400)
RBC: 4.43 MIL/uL (ref 4.22–5.81)
RDW: 21.1 % — AB (ref 11.5–15.5)
WBC: 6.7 10*3/uL (ref 4.0–10.5)

## 2016-03-21 LAB — BASIC METABOLIC PANEL
Anion gap: 15 (ref 5–15)
BUN: 69 mg/dL — AB (ref 6–20)
CALCIUM: 8.6 mg/dL — AB (ref 8.9–10.3)
CHLORIDE: 107 mmol/L (ref 101–111)
CO2: 16 mmol/L — AB (ref 22–32)
CREATININE: 3.68 mg/dL — AB (ref 0.61–1.24)
GFR calc non Af Amer: 16 mL/min — ABNORMAL LOW (ref >60)
GFR, EST AFRICAN AMERICAN: 19 mL/min — AB (ref >60)
Glucose, Bld: 151 mg/dL — ABNORMAL HIGH (ref 65–99)
Potassium: 4.3 mmol/L (ref 3.5–5.1)
SODIUM: 138 mmol/L (ref 135–145)

## 2016-03-21 LAB — I-STAT TROPONIN, ED: TROPONIN I, POC: 0.01 ng/mL (ref 0.00–0.08)

## 2016-03-21 LAB — BRAIN NATRIURETIC PEPTIDE: B Natriuretic Peptide: 437.2 pg/mL — ABNORMAL HIGH (ref 0.0–100.0)

## 2016-03-21 MED ORDER — FUROSEMIDE 10 MG/ML IJ SOLN
40.0000 mg | Freq: Once | INTRAMUSCULAR | Status: AC
  Administered 2016-03-21: 40 mg via INTRAVENOUS
  Filled 2016-03-21: qty 4

## 2016-03-21 MED ORDER — ALBUTEROL SULFATE (2.5 MG/3ML) 0.083% IN NEBU: 5.0000 mg | INHALATION_SOLUTION | Freq: Once | RESPIRATORY_TRACT | Status: DC

## 2016-03-21 MED ORDER — IPRATROPIUM-ALBUTEROL 0.5-2.5 (3) MG/3ML IN SOLN
3.0000 mL | Freq: Once | RESPIRATORY_TRACT | Status: AC
  Administered 2016-03-21: 3 mL via RESPIRATORY_TRACT
  Filled 2016-03-21: qty 3

## 2016-03-21 NOTE — ED Provider Notes (Signed)
[facility] DEPT Provider Note   CSN: [id number] Arrival date & time: 03/21/16  7782     History   Chief Complaint Chief Complaint  Patient presents with  . Shortness of Breath    HPI Leonard [name] is a 66 y.o. male.  The history is provided by the patient.  Shortness of Breath  This is a new problem. The average episode lasts 1 day. The problem occurs continuously.The current episode started 12 to 24 hours ago. The problem has been gradually worsening. Associated symptoms include leg swelling. Pertinent negatives include no rhinorrhea, no cough, no sputum production, no orthopnea and no chest pain. Treatments tried: torsemide; albuterol. The treatment provided mild relief. Associated medical issues include heart failure.    Past Medical History:  Diagnosis Date  . Cancer ([place])    skin cancer-  . CHF (congestive heart failure) ([place])   . Chronic kidney disease    not on dialysis  . Coronary artery disease   . Gangrene of toe ([place])    right 5th/notes 01/04/2013   . High cholesterol   . Hypertension   . Iron deficiency anemia 08/03/2015  . 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 PAD (peripheral artery disease) ([place])   . Shortness of breath dyspnea    with exertion  . Type II diabetes mellitus ([place])    Type II    Patient Active Problem List   Diagnosis Date Noted  . Morbid obesity due to excess calories ([place]) 03/17/2016  . Anemia due to stage 4 chronic kidney disease treated with darbepoetin ([place]) 02/28/2016  . Fatigue 01/21/2016  . Diabetic retinopathy ([place]) 11/13/2015  . CHF (congestive heart failure) ([place]) 08/27/2015  . Iron deficiency anemia 08/03/2015  . Claudication ([place])   . Type 2 diabetes mellitus with stage 3 chronic kidney disease, with long-term current use of insulin ([place])   . Hematuria 06/27/2015  . Skin lesion of face 02/22/2015  . 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 Anemia 01/15/2015    . CKD (chronic kidney disease) stage 4, GFR 15-29 ml/min ([facility]) 01/15/2015  . Hypothyroidism 11/22/2014  . COPD GOLD III  02/16/2014  . History of tobacco use 08/23/2013  . 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 CAD (coronary artery disease) 04/06/2013  . PVD - hx of Rt SFA PTA and s/p Rt 4-5th toe amp Dec 2014 04/05/2013  . Essential hypertension 11/25/2012  . Mixed hyperlipidemia 07/17/2008    Past Surgical History:  Procedure Laterality Date  . AMPUTATION Right 01/28/2013   Procedure: RAY AMPUTATION RIGHT  4th & 5th TOE;  Surgeon: Rosetta Posner, MD;  Location: [facility] OR;  Service: Vascular;  Laterality: Right;  . ANGIOPLASTY / STENTING FEMORAL Right 01/13/2013   superficial femoral artery x 2 (3 mm x 60 mm, 4 mm x 60 mm)  . AV FISTULA PLACEMENT Left 02/11/2016   Procedure: LEFT UPPER ARM BRACHIAL CEPHALIC ARTERIOVENOUS (AV) FISTULA CREATION;  Surgeon: Conrad Maguayo, MD;  Location: [facility];  Service: Vascular;  Laterality: Left;  . COLONOSCOPY W/ POLYPECTOMY    . CORONARY ARTERY BYPASS GRAFT N/A 04/07/2013   Procedure: CORONARY ARTERY BYPASS GRAFTING (CABG);  Surgeon: Ivin Poot, MD;  Location: [facility];  Service: Open Heart Surgery;  Laterality: N/A;  . INTRAOPERATIVE TRANSESOPHAGEAL ECHOCARDIOGRAM N/A 04/07/2013   Procedure: INTRAOPERATIVE TRANSESOPHAGEAL ECHOCARDIOGRAM;  Surgeon: Ivin Poot, MD;  Location: [facility];  Service: Open Heart Surgery;  Laterality: N/A;  . LEFT HEART CATHETERIZATION  WITH CORONARY ANGIOGRAM N/A 04/05/2013   Procedure: LEFT HEART CATHETERIZATION WITH CORONARY ANGIOGRAM;  Surgeon: Peter M Martinique, MD;  Location[facility] CATH LAB;  Service: Cardiovascular;  Laterality: N/A;  . LOWER EXTREMITY ANGIOGRAM Bilateral 01/06/2013   Procedure: LOWER EXTREMITY ANGIOGRAM;  Surgeon: Conrad [place], MD;  Location: [facility] CATH LAB;  Service: Cardiovascular;  Laterality: Bilateral;  . LOWER EXTREMITY ANGIOGRAM Right 01/13/2013   Procedure: LOWER EXTREMITY ANGIOGRAM;  Surgeon: Conrad Imperial, MD;  Location: [facility]  CATH LAB;  Service: Cardiovascular;  Laterality: Right;  . SKIN CANCER EXCISION  2017  . THROAT SURGERY  1983   polyps  . TONSILLECTOMY AND ADENOIDECTOMY  ~ 1965       Home Medications    Prior to Admission medications   Medication Sig Start Date End Date Taking? Authorizing Provider  amLODipine (NORVASC) 10 MG tablet Take 1 tablet (10 mg total) by mouth daily. 02/19/16  Yes Amy D Clegg, NP  aspirin 81 MG tablet TAKE 81 MG BY MOUTH EVERY DAY 02/11/16  Yes Alvia Grove, PA-C  atorvastatin (LIPITOR) 40 MG tablet TAKE 1 TABLET BY MOUTH EVERY DAY AT 6 PM Patient taking differently: Take 40 mg by mouth daily at 6 PM.  11/07/15  Yes Shirley Friar, PA-C  carvedilol (COREG) 25 MG tablet Take 2 tablets (50 mg total) by mouth 2 (two) times daily with a meal. 10/23/15  Yes Shirley Friar, PA-C  cloNIDine (CATAPRES) 0.2 MG tablet TAKE ONE (1) TABLET BY MOUTH TWO (2) TIMES DAILY 02/27/16  Yes [name], MD  doxazosin (CARDURA) 8 MG tablet Take 1 tablet (8 mg total) by mouth 2 (two) times daily. 01/07/16  Yes Larey Dresser, MD  ferrous sulfate (FERROUSUL) 325 (65 FE) MG tablet Take 1 tablet (325 mg total) by mouth 2 (two) times daily with a meal. 01/24/16  Yes Eloise Levels, MD  hydrALAZINE (APRESOLINE) 100 MG tablet TAKE ONE TABLET BY MOUTH EVERY EIGHT HOURS Patient taking differently: Take 100 mg by mouth every 8 (eight) hours.  10/23/15  Yes Shirley Friar, PA-C  insulin aspart (NOVOLOG FLEXPEN) 100 UNIT/ML FlexPen Inject 12 Units into the skin 3 (three) times daily with meals. 03/10/16  Yes Zenia Resides, MD  Insulin Glargine (LANTUS SOLOSTAR) 100 UNIT/ML Solostar Pen Inject 40 Units into the skin at bedtime. 09/14/15  Yes Zenia Resides, MD  isosorbide mononitrate (IMDUR) 60 MG 24 hr tablet Take 60 mg by mouth daily.   Yes Historical Provider, MD  levothyroxine (SYNTHROID, LEVOTHROID) 50 MCG tablet TAKE ONE (1) TABLET BY MOUTH EVERY DAY BEFORE BREAKFAST 09/26/15   Yes [name], MD  NIFEdipine (PROCARDIA XL/ADALAT-CC) 60 MG 24 hr tablet Take 1 tablet (60 mg total) by mouth 2 (two) times daily. 03/10/16  Yes Zenia Resides, MD  nitroGLYCERIN (NITROSTAT) 0.4 MG SL tablet Place 1 tablet (0.4 mg total) under the tongue every 5 (five) minutes as needed for chest pain. 06/06/15  Yes Mercy Riding, MD  pantoprazole (PROTONIX) 40 MG tablet TAKE 1 TABLET BY MOUTH DAILY Patient taking differently: TAKE 40 MG BY MOUTH DAILY 10/18/15  Yes [name], MD  potassium chloride SA (K-DUR,KLOR-CON) 20 MEQ tablet Take 17meq (1 tablet ) in the AM and 78meq (1/2 tablet) in the PM Patient taking differently: Take 10-20 mEq by mouth See admin instructions. Take 22meq (1 tablet ) in the AM and 72meq (1/2 tablet) in the PM 11/21/15  Yes Shirley Friar,  PA-C  Tiotropium Bromide Monohydrate (SPIRIVA RESPIMAT) 2.5 MCG/ACT AERS Inhale 2 puffs into the lungs daily. Patient taking differently: Inhale 2 puffs into the lungs daily as needed (occasional use).  01/31/16  Yes Tanda Rockers, MD  torsemide (DEMADEX) 20 MG tablet Take 2 tablets (40 mg total) by mouth 2 (two) times daily. May take an extra 20 mg Daily for weight gain above 212 lbs 02/18/16  Yes Amy Estrella Deeds, NP    Family History Family History  Problem Relation Age of Onset  . Cancer Mother     lung cancer  . Hypertension Mother   . Cancer Sister     patient thinks it was uterine cancer  . Hypertension Sister   . Heart attack Sister   . Stroke Neg Hx     Social History Social History  Substance Use Topics  . Smoking status: Former Smoker    Packs/day: 1.00    Years: 46.00    Types: Cigarettes    Start date: 02/04/1967    Quit date: 02/03/2013  . Smokeless tobacco: Never Used     Comment: Doing well with quitting.  . Alcohol use No     Allergies   No known allergies   Review of Systems Review of Systems  HENT: Negative for rhinorrhea.   Respiratory: Positive for shortness of breath.  Negative for cough and sputum production.   Cardiovascular: Positive for leg swelling. Negative for chest pain and orthopnea.   Ten systems are reviewed and are negative for acute change except as noted in the HPI   Physical Exam Updated Vital Signs BP 157/70 (BP Location: Right Arm)   Pulse 81   Temp 97.7 F (36.5 C) (Oral)   Resp 25   Ht 5\' 8"  (1.727 m)   Wt 213 lb (96.6 kg)   SpO2 96%   BMI 32.39 kg/m   Physical Exam  Constitutional: He is oriented to person, place, and time. He appears well-developed and well-nourished. No distress.  HENT:  Head: Normocephalic and atraumatic.  Nose: Nose normal.  Eyes: Conjunctivae and EOM are normal. Pupils are equal, round, and reactive to light. Right eye exhibits no discharge. Left eye exhibits no discharge. No scleral icterus.  Neck: Normal range of motion. Neck supple.  Cardiovascular: Normal rate and regular rhythm.  Exam reveals no gallop and no friction rub.   No murmur heard. Pulmonary/Chest: Effort normal. No stridor. No respiratory distress. He has decreased breath sounds in the right lower field and the left lower field. He has no rales.  Abdominal: Soft. He exhibits no distension. There is no tenderness.  Musculoskeletal: He exhibits no edema or tenderness.  BLE edema  Neurological: He is alert and oriented to person, place, and time.  Skin: Skin is warm and dry. No rash noted. He is not diaphoretic. No erythema.  Psychiatric: He has a normal mood and affect.  Vitals reviewed.    ED Treatments / Results  Labs (all labs ordered are listed, but only abnormal results are displayed) Labs Reviewed  BASIC METABOLIC PANEL - Abnormal; Notable for the following:       Result Value   CO2 16 (*)    Glucose, Bld 151 (*)    BUN 69 (*)    Creatinine, Ser 3.68 (*)    Calcium 8.6 (*)    GFR calc non Af Amer 16 (*)    GFR calc Af Amer 19 (*)    All other components within normal limits  CBC -  Abnormal; Notable for the following:     Hemoglobin 10.5 (*)    HCT 33.9 (*)    MCV 76.5 (*)    MCH 23.7 (*)    RDW 21.1 (*)    All other components within normal limits  BRAIN NATRIURETIC PEPTIDE - Abnormal; Notable for the following:    B Natriuretic Peptide 437.2 (*)    All other components within normal limits  I-STAT TROPOININ, ED    EKG  EKG Interpretation None       Radiology Dg Chest 2 View  Result Date: 03/21/2016 CLINICAL DATA:  Shortness of breath today. No chest pain or fever. History of congestive heart failure, diabetes and skin cancer EXAM: CHEST  2 VIEW COMPARISON:  01/21/2016 and 01/14/2016. FINDINGS: Stable cardiomegaly post median sternotomy and CABG. There is persistent pulmonary edema with an enlarging right pleural effusion. Small left pleural effusion appears unchanged. No evidence of pneumothorax. The bones appear unchanged. IMPRESSION: Persistent cardiomegaly, edema and enlarging right pleural effusion, again consistent with congestive heart failure. Electronically Signed   By: Richardean Sale M.D.   On: 03/21/2016 20:04    Procedures Procedures (including critical care time)  Medications Ordered in ED Medications  ipratropium-albuterol (DUONEB) 0.5-2.5 (3) MG/3ML nebulizer solution 3 mL (3 mLs Nebulization Given 03/21/16 1931)     Initial Impression / Assessment and Plan / ED Course  I have reviewed the triage vital signs and the nursing notes.  Pertinent labs & imaging results that were available during my care of the patient were reviewed by me and considered in my medical decision making (see chart for details).     Presentation consistent with CHF exacerbation. Given 40mg  of IV lasix. Admitted to [facility] for continued management.  Final Clinical Impressions(s) / ED Diagnoses   Final diagnoses:  Acute on chronic diastolic congestive heart failure ([place])  Pleural effusion      Fatima Blank, MD 03/22/16 [phone]

## 2016-03-21 NOTE — ED Triage Notes (Signed)
[facility] reports the pt woke up with SOB this am. EMS reports clear and equal lungs sounds. Their initial assessment revealed a room air oxygen saturation of 92%. EMS reports a triple bypass 3 years ago and a left fistula that is not yet being used for dialysis, same will be used in the future. EMS reports no chest pain. No IV started and vital signs arew 162/70, P-72, R-16, Oxygen sats 97% on 2 liters. EMS reports the pt ambulated to their truck.   Pt reports he woke up this am with a feeling that he could not catch his breath. Pt denies being around anyone sick. Pt denies a fever or cough.

## 2016-03-21 NOTE — Telephone Encounter (Signed)
Pt's o2 was 84 then retested @ 94 on 03-20-16. Today it was 86. Pt has no complaints and is feeling ok. Anne faxed a form on 03-10-16, it was a referral form about continuing services.ep

## 2016-03-21 NOTE — H&P (Signed)
[facility] Admission History and Physical Service Pager: [phone]  [name] Medical record number: [id number] Date of birth: [birth year] Age: 66 y.o. Gender: male  Primary Care Provider: [name], MD Consultants: None Code Status: Full  Chief Complaint: shortness of breath  Assessment and Plan: Leonard [name] is a 66 y.o. male presenting with acute SOB. PMH is significant for CKD3, HTN, CAD, diastolic HF (EF 07-12% with G2DD ECHO 06/2015), COPD, MGUS, anemia, T2DM.   Dyspnea: Treating as CHF exacerbation. With new O2 requirement. BNP 437.2 (as low as 64.3 last fall). Increased size of known pleural effusions. Takes 40 mg torsemide BID at home but has missed doses.  - Admit to inpatient, attending Dr. Ree Kida - s/p IV lasix 40 mg - Lasix 40 mg IV BID ordered for a.m. but reassess fluid status - Telemetry - Incentive spirometry - Daily weights - Strict I/Os - Wean O2 as able  - Trend troponins given significant CAD  COPD: Thought actually to be more of cardiac/intermittent asthma at last pulmonology visit; spiriva stopped. - Duonebs q6h prn   T2DM: Lantus increased to 40 U last month. No SSI because concern for how patient is taking. Last hgb A1c 9.7 12/17. - Half home insulin 20 U, titrate up as needed - CBGs qACHS, sensitive SSI  HTN: On 7 medications at home (clonidine 0.3 mg BID, coreg 50 mg BID, hydralazine 100 mg TID, imdur 60 mg daily, amlodipine 10 mg daily, nifedipine 60 mg BID, doxazosin 4 mg BID). Has had negative renal artery U/S.  - Will continue coreg, clonidine, imdur, doxazosin on admisison  CAD: s/p CABG in 2015.  - Continue home asa 81 mg and lipitor 40 mg.   CKDV: SCr 3.68, similar to previous. Left brachiocephalic AV fistula placed 03/13/16. Sees Dr. Marval Regal.  - Avoid nephrotoxic medications as able  Hypothyroidism: TSH mildly elevated at 4.11 Jan 2016. - Continue home synthroid 50 mcg daily -  Consider recheck of TSH  Anemia: Stable at 10.5 (BL ~10). Chronic microcytic anemia. Hx renal failure and MGUS. To follow-up with GI next week (2/21), as had positive FOBT last admission.  - Continue home iron supplementation.   FEN/GI: carb-modified/heart healthy, protonix Prophylaxis: heparin  Disposition: IV diuresis   History of Present Illness:  Leonard [name] is a 66 y.o. male presenting with worsening SOB since morning of presentation. He reports feeling increased fatigue today but denies change in ability to perform daily activities. Otherwise, he denies fever, chills, chest pain, sick contacts, cough. He reports that he does occasionally miss doses of torsemide, including some earlier this week. Had been recently instructed by Cardiology to take an extra 20 mg torsemide with weight 212 or greater. Patient reports his dry weight to be around 213 and says he weighs himself daily. He denies any medication changes, aside from spiriva being discontinued.   In the ED, he was requiring 2L O2 to maintain sats above 90%. He says he is not currently on O2 at home but had been about 1 year ago. WBC 6.7, SCR 3.68. I-stat troponin 0.01. BNP 437.2. CXR with edema and enlarging right pleural effusion.   Review Of Systems: Per HPI with the following additions:   Review of Systems  Constitutional: Negative for chills and fever.  HENT: Negative for congestion and sore throat.   Eyes: Negative for blurred vision and pain.  Respiratory: Positive for shortness of breath. Negative for cough.   Cardiovascular: Positive for  leg swelling. Negative for chest pain and palpitations.  Gastrointestinal: Negative for abdominal pain, heartburn and nausea.  Genitourinary: Negative for dysuria and urgency.  Musculoskeletal: Negative for falls and myalgias.  Skin: Negative for rash.    Patient Active Problem List   Diagnosis Date Noted  . Morbid obesity due to excess calories ([place]) 03/17/2016  . Anemia due to  stage 4 chronic kidney disease treated with darbepoetin ([place]) 02/28/2016  . Fatigue 01/21/2016  . Diabetic retinopathy ([place]) 11/13/2015  . CHF (congestive heart failure) ([place]) 08/27/2015  . Iron deficiency anemia 08/03/2015  . Claudication ([place])   . Type 2 diabetes mellitus with stage 3 chronic kidney disease, with long-term current use of insulin ([place])   . Hematuria 06/27/2015  . Skin lesion of face 02/22/2015  . 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 Anemia 01/15/2015  . CKD (chronic kidney disease) stage 4, GFR 15-29 ml/min ([facility]) 01/15/2015  . Hypothyroidism 11/22/2014  . COPD GOLD III  02/16/2014  . History of tobacco use 08/23/2013  . 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 CAD (coronary artery disease) 04/06/2013  . PVD - hx of Rt SFA PTA and s/p Rt 4-5th toe amp Dec 2014 04/05/2013  . Essential hypertension 11/25/2012  . Mixed hyperlipidemia 07/17/2008    Past Medical History: Past Medical History:  Diagnosis Date  . Cancer ([place])    skin cancer-  . CHF (congestive heart failure) ([place])   . Chronic kidney disease    not on dialysis  . Coronary artery disease   . Gangrene of toe ([place])    right 5th/notes 01/04/2013   . High cholesterol   . Hypertension   . Iron deficiency anemia 08/03/2015  . 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 PAD (peripheral artery disease) ([place])   . Shortness of breath dyspnea    with exertion  . Type II diabetes mellitus ([place])    Type II    Past Surgical History: Past Surgical History:  Procedure Laterality Date  . AMPUTATION Right 01/28/2013   Procedure: RAY AMPUTATION RIGHT  4th & 5th TOE;  Surgeon: Rosetta Posner, MD;  Location: [facility] OR;  Service: Vascular;  Laterality: Right;  . ANGIOPLASTY / STENTING FEMORAL Right 01/13/2013   superficial femoral artery x 2 (3 mm x 60 mm, 4 mm x 60 mm)  . AV FISTULA PLACEMENT Left 02/11/2016   Procedure: LEFT UPPER ARM BRACHIAL CEPHALIC ARTERIOVENOUS (AV) FISTULA  CREATION;  Surgeon: Conrad Rimersburg, MD;  Location: [facility];  Service: Vascular;  Laterality: Left;  . COLONOSCOPY W/ POLYPECTOMY    . CORONARY ARTERY BYPASS GRAFT N/A 04/07/2013   Procedure: CORONARY ARTERY BYPASS GRAFTING (CABG);  Surgeon: Ivin Poot, MD;  Location: [facility];  Service: Open Heart Surgery;  Laterality: N/A;  . INTRAOPERATIVE TRANSESOPHAGEAL ECHOCARDIOGRAM N/A 04/07/2013   Procedure: INTRAOPERATIVE TRANSESOPHAGEAL ECHOCARDIOGRAM;  Surgeon: Ivin Poot, MD;  Location: [facility];  Service: Open Heart Surgery;  Laterality: N/A;  . LEFT HEART CATHETERIZATION WITH CORONARY ANGIOGRAM N/A 04/05/2013   Procedure: LEFT HEART CATHETERIZATION WITH CORONARY ANGIOGRAM;  Surgeon: Peter M Martinique, MD;  Location: [facility] CATH LAB;  Service: Cardiovascular;  Laterality: N/A;  . LOWER EXTREMITY ANGIOGRAM Bilateral 01/06/2013   Procedure: LOWER EXTREMITY ANGIOGRAM;  Surgeon: Conrad Sells, MD;  Location[facility] CATH LAB;  Service: Cardiovascular;  Laterality: Bilateral;  . LOWER EXTREMITY ANGIOGRAM Right 01/13/2013   Procedure: LOWER EXTREMITY ANGIOGRAM;  Surgeon: Conrad , MD;  Location: [facility]  CATH LAB;  Service: Cardiovascular;  Laterality: Right;  . SKIN CANCER EXCISION  2017  . THROAT SURGERY  1983   polyps  . TONSILLECTOMY AND ADENOIDECTOMY  ~ 1965    Social History: Social History  Substance Use Topics  . Smoking status: Former Smoker    Packs/day: 1.00    Years: 46.00    Types: Cigarettes    Start date: 02/04/1967    Quit date: 02/03/2013  . Smokeless tobacco: Never Used     Comment: Doing well with quitting.  . Alcohol use No   Additional social history: Lives with sister.  Please also refer to relevant sections of EMR.  Family History: Family History  Problem Relation Age of Onset  . Cancer Mother     lung cancer  . Hypertension Mother   . Cancer Sister     patient thinks it was uterine cancer  . Hypertension Sister   . Heart attack Sister   . Stroke Neg Hx     Allergies and  Medications: Allergies  Allergen Reactions  . No Known Allergies    No current facility-administered medications on file prior to encounter.    Current Outpatient Prescriptions on File Prior to Encounter  Medication Sig Dispense Refill  . amLODipine (NORVASC) 10 MG tablet Take 1 tablet (10 mg total) by mouth daily. 30 tablet 6  . aspirin 81 MG tablet TAKE 81 MG BY MOUTH EVERY DAY 30 tablet 3  . atorvastatin (LIPITOR) 40 MG tablet TAKE 1 TABLET BY MOUTH EVERY DAY AT 6 PM (Patient taking differently: Take 40 mg by mouth daily at 6 PM. ) 90 tablet 3  . carvedilol (COREG) 25 MG tablet Take 2 tablets (50 mg total) by mouth 2 (two) times daily with a meal. 120 tablet 3  . cloNIDine (CATAPRES) 0.2 MG tablet TAKE ONE (1) TABLET BY MOUTH TWO (2) TIMES DAILY 30 tablet 1  . doxazosin (CARDURA) 8 MG tablet Take 1 tablet (8 mg total) by mouth 2 (two) times daily. 60 tablet 6  . ferrous sulfate (FERROUSUL) 325 (65 FE) MG tablet Take 1 tablet (325 mg total) by mouth 2 (two) times daily with a meal. 60 tablet 3  . hydrALAZINE (APRESOLINE) 100 MG tablet TAKE ONE TABLET BY MOUTH EVERY EIGHT HOURS (Patient taking differently: Take 100 mg by mouth every 8 (eight) hours. ) 90 tablet 3  . insulin aspart (NOVOLOG FLEXPEN) 100 UNIT/ML FlexPen Inject 12 Units into the skin 3 (three) times daily with meals.    . Insulin Glargine (LANTUS SOLOSTAR) 100 UNIT/ML Solostar Pen Inject 40 Units into the skin at bedtime.    . isosorbide mononitrate (IMDUR) 60 MG 24 hr tablet Take 60 mg by mouth daily.    Marland Kitchen levothyroxine (SYNTHROID, LEVOTHROID) 50 MCG tablet TAKE ONE (1) TABLET BY MOUTH EVERY DAY BEFORE BREAKFAST 30 tablet 3  . NIFEdipine (PROCARDIA XL/ADALAT-CC) 60 MG 24 hr tablet Take 1 tablet (60 mg total) by mouth 2 (two) times daily. 60 tablet 11  . nitroGLYCERIN (NITROSTAT) 0.4 MG SL tablet Place 1 tablet (0.4 mg total) under the tongue every 5 (five) minutes as needed for chest pain. 30 tablet 12  . pantoprazole  (PROTONIX) 40 MG tablet TAKE 1 TABLET BY MOUTH DAILY (Patient taking differently: TAKE 40 MG BY MOUTH DAILY) 30 tablet 3  . potassium chloride SA (K-DUR,KLOR-CON) 20 MEQ tablet Take 54meq (1 tablet ) in the AM and 57meq (1/2 tablet) in the PM (Patient taking differently: Take 10-20 mEq  by mouth See admin instructions. Take 93meq (1 tablet ) in the AM and 40meq (1/2 tablet) in the PM) 135 tablet 3  . Tiotropium Bromide Monohydrate (SPIRIVA RESPIMAT) 2.5 MCG/ACT AERS Inhale 2 puffs into the lungs daily. (Patient taking differently: Inhale 2 puffs into the lungs daily as needed (occasional use). ) 1 Inhaler 0  . torsemide (DEMADEX) 20 MG tablet Take 2 tablets (40 mg total) by mouth 2 (two) times daily. May take an extra 20 mg Daily for weight gain above 212 lbs 130 tablet 3    Objective: BP 157/70 (BP Location: Right Arm)   Pulse 81   Temp 97.7 F (36.5 C) (Oral)   Resp 25   Ht 5\' 8"  (1.727 m)   Wt 213 lb (96.6 kg)   SpO2 96%   BMI 32.39 kg/m  Exam: General: Obese, pleasant male, in NAD, sitting up in bed Eyes: PERRLA, EOMI ENTM: MMM, normal posterior oropharynx Neck: No JVD noted, Supple Cardiovascular: RRR, S1, S2, no m/r/g noted; L AV fistula with thrill Respiratory: Decreased breath sounds over bilateral lung bases. Speaking in complete sentences with ease. [place] in place. Gastrointestinal: +BS, soft, non-tender, nondisteded MSK: 2+ pitting edema to ankles/midshin, moves all spontaneously  Derm: No rashes or lesions noted Neuro: AOx3, no focal deficits Psych: Normal mood and affect  Labs and Imaging: CBC BMET   Recent Labs Lab 03/21/16 1937  WBC 6.7  HGB 10.5*  HCT 33.9*  PLT 171    Recent Labs Lab 03/21/16 1937  NA 138  K 4.3  CL 107  CO2 16*  BUN 69*  CREATININE 3.68*  GLUCOSE 151*  CALCIUM 8.6*     Dg Chest 2 View  Result Date: 03/21/2016 CLINICAL DATA:  Shortness of breath today. No chest pain or fever. History of congestive heart failure, diabetes and skin  cancer EXAM: CHEST  2 VIEW COMPARISON:  01/21/2016 and 01/14/2016. FINDINGS: Stable cardiomegaly post median sternotomy and CABG. There is persistent pulmonary edema with an enlarging right pleural effusion. Small left pleural effusion appears unchanged. No evidence of pneumothorax. The bones appear unchanged. IMPRESSION: Persistent cardiomegaly, edema and enlarging right pleural effusion, again consistent with congestive heart failure. Electronically Signed   By: Richardean Sale M.D.   On: 03/21/2016 20:04    Rogue Bussing, MD 03/21/2016, 11:08 PM PGY-2, [place] Intern pager: [phone], text pages welcome

## 2016-03-21 NOTE — ED Notes (Signed)
Pt reports he woke up this am with a feeling that he could not catch his breath. Pt denies being around anyone sick. Pt denies a fever or cough.

## 2016-03-22 DIAGNOSIS — J90 Pleural effusion, not elsewhere classified: Secondary | ICD-10-CM

## 2016-03-22 DIAGNOSIS — R0902 Hypoxemia: Secondary | ICD-10-CM

## 2016-03-22 LAB — BASIC METABOLIC PANEL
ANION GAP: 13 (ref 5–15)
BUN: 67 mg/dL — ABNORMAL HIGH (ref 6–20)
CHLORIDE: 110 mmol/L (ref 101–111)
CO2: 18 mmol/L — ABNORMAL LOW (ref 22–32)
Calcium: 8.6 mg/dL — ABNORMAL LOW (ref 8.9–10.3)
Creatinine, Ser: 3.38 mg/dL — ABNORMAL HIGH (ref 0.61–1.24)
GFR calc Af Amer: 21 mL/min — ABNORMAL LOW (ref >60)
GFR, EST NON AFRICAN AMERICAN: 18 mL/min — AB (ref >60)
GLUCOSE: 161 mg/dL — AB (ref 65–99)
POTASSIUM: 3.8 mmol/L (ref 3.5–5.1)
SODIUM: 141 mmol/L (ref 135–145)

## 2016-03-22 LAB — CBC
HEMATOCRIT: 34.1 % — AB (ref 39.0–52.0)
HEMOGLOBIN: 10.5 g/dL — AB (ref 13.0–17.0)
MCH: 23.9 pg — ABNORMAL LOW (ref 26.0–34.0)
MCHC: 30.8 g/dL (ref 30.0–36.0)
MCV: 77.5 fL — ABNORMAL LOW (ref 78.0–100.0)
Platelets: 148 10*3/uL — ABNORMAL LOW (ref 150–400)
RBC: 4.4 MIL/uL (ref 4.22–5.81)
RDW: 21.7 % — ABNORMAL HIGH (ref 11.5–15.5)
WBC: 7.1 10*3/uL (ref 4.0–10.5)

## 2016-03-22 LAB — TROPONIN I: Troponin I: <0.03 ng/mL (ref <0.03)

## 2016-03-22 LAB — HIV ANTIBODY (ROUTINE TESTING W REFLEX): HIV SCREEN 4TH GENERATION: NONREACTIVE

## 2016-03-22 LAB — TSH: TSH: 4.853 u[IU]/mL — ABNORMAL HIGH (ref 0.350–4.500)

## 2016-03-22 LAB — GLUCOSE, CAPILLARY
GLUCOSE-CAPILLARY: 138 mg/dL — AB (ref 65–99)
GLUCOSE-CAPILLARY: 165 mg/dL — AB (ref 65–99)
GLUCOSE-CAPILLARY: 159 mg/dL — AB (ref 65–99)
Glucose-Capillary: 145 mg/dL — ABNORMAL HIGH (ref 65–99)

## 2016-03-22 MED ORDER — INSULIN ASPART 100 UNIT/ML ~~LOC~~ SOLN
0.0000 [IU] | Freq: Three times a day (TID) | SUBCUTANEOUS | Status: DC
  Administered 2016-03-22: 2 [IU] via SUBCUTANEOUS
  Administered 2016-03-22: 1 [IU] via SUBCUTANEOUS
  Administered 2016-03-22: 2 [IU] via SUBCUTANEOUS
  Administered 2016-03-23: 3 [IU] via SUBCUTANEOUS
  Administered 2016-03-23: 2 [IU] via SUBCUTANEOUS
  Administered 2016-03-24: 1 [IU] via SUBCUTANEOUS

## 2016-03-22 MED ORDER — FERROUS SULFATE 325 (65 FE) MG PO TABS
325.0000 mg | ORAL_TABLET | Freq: Two times a day (BID) | ORAL | Status: DC
  Administered 2016-03-22 – 2016-03-24 (×5): 325 mg via ORAL
  Filled 2016-03-22 (×5): qty 1

## 2016-03-22 MED ORDER — ISOSORBIDE MONONITRATE ER 60 MG PO TB24
60.0000 mg | ORAL_TABLET | Freq: Every day | ORAL | Status: DC
  Administered 2016-03-22 – 2016-03-24 (×3): 60 mg via ORAL
  Filled 2016-03-22 (×3): qty 1

## 2016-03-22 MED ORDER — FUROSEMIDE 10 MG/ML IJ SOLN
40.0000 mg | Freq: Two times a day (BID) | INTRAMUSCULAR | Status: DC
  Administered 2016-03-22 – 2016-03-23 (×3): 40 mg via INTRAVENOUS
  Filled 2016-03-22 (×3): qty 4

## 2016-03-22 MED ORDER — INSULIN GLARGINE 100 UNIT/ML ~~LOC~~ SOLN
20.0000 [IU] | Freq: Every day | SUBCUTANEOUS | Status: DC
  Administered 2016-03-22 – 2016-03-23 (×2): 20 [IU] via SUBCUTANEOUS
  Filled 2016-03-22 (×2): qty 0.2

## 2016-03-22 MED ORDER — ASPIRIN EC 81 MG PO TBEC
81.0000 mg | DELAYED_RELEASE_TABLET | Freq: Every day | ORAL | Status: DC
  Administered 2016-03-22 – 2016-03-24 (×3): 81 mg via ORAL
  Filled 2016-03-22 (×3): qty 1

## 2016-03-22 MED ORDER — DOXAZOSIN MESYLATE 2 MG PO TABS
8.0000 mg | ORAL_TABLET | Freq: Two times a day (BID) | ORAL | Status: DC
  Administered 2016-03-22 – 2016-03-24 (×5): 8 mg via ORAL
  Filled 2016-03-22 (×6): qty 4

## 2016-03-22 MED ORDER — CLONIDINE HCL 0.2 MG PO TABS
0.2000 mg | ORAL_TABLET | Freq: Two times a day (BID) | ORAL | Status: DC
  Administered 2016-03-22 – 2016-03-24 (×5): 0.2 mg via ORAL
  Filled 2016-03-22 (×6): qty 1

## 2016-03-22 MED ORDER — LEVOTHYROXINE SODIUM 50 MCG PO TABS
50.0000 ug | ORAL_TABLET | Freq: Every day | ORAL | Status: DC
  Administered 2016-03-22 – 2016-03-24 (×3): 50 ug via ORAL
  Filled 2016-03-22 (×3): qty 1

## 2016-03-22 MED ORDER — HEPARIN SODIUM (PORCINE) 5000 UNIT/ML IJ SOLN
5000.0000 [IU] | Freq: Three times a day (TID) | INTRAMUSCULAR | Status: DC
  Administered 2016-03-22 – 2016-03-24 (×7): 5000 [IU] via SUBCUTANEOUS
  Filled 2016-03-22 (×7): qty 1

## 2016-03-22 MED ORDER — ORAL CARE MOUTH RINSE: 15.0000 mL | Freq: Two times a day (BID) | OROMUCOSAL | Status: DC

## 2016-03-22 MED ORDER — PANTOPRAZOLE SODIUM 40 MG PO TBEC
40.0000 mg | DELAYED_RELEASE_TABLET | Freq: Every day | ORAL | Status: DC
  Administered 2016-03-22 – 2016-03-24 (×3): 40 mg via ORAL
  Filled 2016-03-22 (×3): qty 1

## 2016-03-22 MED ORDER — CARVEDILOL 25 MG PO TABS
50.0000 mg | ORAL_TABLET | Freq: Two times a day (BID) | ORAL | Status: DC
  Administered 2016-03-22 – 2016-03-24 (×5): 50 mg via ORAL
  Filled 2016-03-22 (×5): qty 2

## 2016-03-22 MED ORDER — IPRATROPIUM-ALBUTEROL 0.5-2.5 (3) MG/3ML IN SOLN
3.0000 mL | Freq: Four times a day (QID) | RESPIRATORY_TRACT | Status: DC | PRN
  Administered 2016-03-22: 3 mL via RESPIRATORY_TRACT
  Filled 2016-03-22: qty 3

## 2016-03-22 MED ORDER — ATORVASTATIN CALCIUM 40 MG PO TABS
40.0000 mg | ORAL_TABLET | Freq: Every day | ORAL | Status: DC
  Administered 2016-03-22 – 2016-03-23 (×2): 40 mg via ORAL
  Filled 2016-03-22 (×2): qty 1

## 2016-03-22 MED ORDER — SODIUM CHLORIDE 0.9% FLUSH
3.0000 mL | Freq: Two times a day (BID) | INTRAVENOUS | Status: DC
  Administered 2016-03-22 – 2016-03-23 (×5): 3 mL via INTRAVENOUS

## 2016-03-22 NOTE — Progress Notes (Signed)
Pt arrived to 2w26. Tele box placed, CCMD verified x2. Pt dyspneic with exertion, but does not c/o any pain. AOx4.  Pt updated on plan of care. Oriented to room and call light. Will continue to monitor.  Jaymes Graff, RN

## 2016-03-22 NOTE — Progress Notes (Signed)
Family Medicine Teaching Service Daily Progress Note Intern Pager: [phone]  [name] Medical record number: [id number] Date of birth: 1950/05/16 Age: 66 y.o. Gender: male  Primary Care Provider: [name], MD Consultants: None Code Status: Full  Pt Overview and Major Events to Date:  Admitted 2/16 for CHF exacerbation  Assessment and Plan: MILAD BUBLITZ is a 66 y.o. male presenting with acute SOB. PMH is significant for CKD3, HTN, CAD, diastolic HF (EF 36-64% with G2DD ECHO 06/2015), COPD, MGUS, anemia, T2DM.   Dyspnea: Improved, at dry weight. Treating as CHF exacerbation. Still on O2 and crackles on exam, elevated BNP 437. Troponins negative x 2.  - s/p IV lasix 40 mg - Lasix 40 mg IV BID today, plan to transition back to home torsemide 2/18 - Continue telemetry for frequent PVCs - Incentive spirometry - Daily weights; down 1 lb - Strict I/Os; net negative 1.3 L - Wean O2 as able; now on 3L   COPD: Thought actually to be more of cardiac/intermittent asthma at last pulmonology visit; spiriva stopped. - Duonebs q6h prn   T2DM: Lantus increased to 40 U last month. No SSI because concern for how patient is taking. Last hgb A1c 9.7 12/17. - Half home insulin 20 U, titrate up as needed - CBGs qACHS, sensitive SSI  HTN: Slightly elevated at 148/66. On 7 medications at home (clonidine 0.3 mg BID, coreg 50 mg BID, hydralazine 100 mg TID, imdur 60 mg daily, amlodipine 10 mg daily, nifedipine 60 mg BID, doxazosin 4 mg BID). Has had negative renal artery U/S.  - Continue coreg, clonidine, imdur, doxazosin  - Add back amlodipine if remains hypertensive despite increased diuresis today  CAD: s/p CABG in 2015.  - Continue home asa 81 mg and lipitor 40 mg.   CKDV: SCr 3.68, similar to previous. Left brachiocephalic AV fistula placed 03/13/16. Sees Dr. Marval Regal.  - Avoid nephrotoxic medications as able  Hypothyroidism: TSH mildly elevated at 4.11 Jan 2016. - Continue home synthroid 50 mcg daily - Recheck TSH  Anemia: Stable at 10.5 (BL ~10). Chronic microcytic anemia. Hx renal failure and MGUS. To follow-up with GI next week (2/21), as had positive FOBT last admission.  - Continue home iron supplementation.   FEN/GI: carb-modified/heart healthy, protonix Prophylaxis: heparin  Disposition: IV diuresis until more euvolemic  Subjective:  [name] feels like his breathing is somewhat improved since admission. Feels more comfortable with oxygen on.   Objective: Temp:  [97.7 F (36.5 C)-98.7 F (37.1 C)] 98.7 F (37.1 C) (02/17 0626) Pulse Rate:  [71-91] 91 (02/17 0626) Resp:  [14-25] 20 (02/17 0626) BP: (143-175)/(62-86) 164/64 (02/17 0626) SpO2:  [88 %-96 %] 88 % (02/17 0626) Weight:  [211 lb 14.4 oz (96.1 kg)-213 lb (96.6 kg)] 211 lb 14.4 oz (96.1 kg) (02/17 0120) Physical Exam: General: Obese male, lying flat in bed in NAD Cardiovascular: RRR, S1, S2, no m/r/g Respiratory: Crackles at lung bases bilaterally with decreased breath sounds R > L.  Abdomen: +BS, soft, NT, ND, no rebound or guarding Extremities: Trace to 1+ edema to ankles  Laboratory:  Recent Labs Lab 03/21/16 1937 03/22/16 0348  WBC 6.7 7.1  HGB 10.5* 10.5*  HCT 33.9* 34.1*  PLT 171 148*    Recent Labs Lab 03/21/16 1937 03/22/16 0348  NA 138 141  K 4.3 3.8  CL 107 110  CO2 16* 18*  BUN 69* 67*  CREATININE 3.68* 3.38*  CALCIUM 8.6* 8.6*  GLUCOSE 151*  161*    Imaging/Diagnostic Tests: Dg Chest 2 View  Result Date: 03/21/2016 CLINICAL DATA:  Shortness of breath today. No chest pain or fever. History of congestive heart failure, diabetes and skin cancer EXAM: CHEST  2 VIEW COMPARISON:  01/21/2016 and 01/14/2016. FINDINGS: Stable cardiomegaly post median sternotomy and CABG. There is persistent pulmonary edema with an enlarging right pleural effusion. Small left pleural effusion appears unchanged. No evidence of pneumothorax. The bones appear  unchanged. IMPRESSION: Persistent cardiomegaly, edema and enlarging right pleural effusion, again consistent with congestive heart failure. Electronically Signed   By: Richardean Sale M.D.   On: 03/21/2016 20:04    Rogue Bussing, MD 03/22/2016, 6:49 AM PGY-2, [place] Intern pager: [phone], text pages welcome

## 2016-03-23 LAB — GLUCOSE, CAPILLARY
GLUCOSE-CAPILLARY: 102 mg/dL — AB (ref 65–99)
GLUCOSE-CAPILLARY: 221 mg/dL — AB (ref 65–99)
GLUCOSE-CAPILLARY: 142 mg/dL — AB (ref 65–99)
Glucose-Capillary: 161 mg/dL — ABNORMAL HIGH (ref 65–99)

## 2016-03-23 LAB — BASIC METABOLIC PANEL
Anion gap: 12 (ref 5–15)
BUN: 50 mg/dL — ABNORMAL HIGH (ref 6–20)
CALCIUM: 8.9 mg/dL (ref 8.9–10.3)
CO2: 22 mmol/L (ref 22–32)
CREATININE: 2.85 mg/dL — AB (ref 0.61–1.24)
Chloride: 109 mmol/L (ref 101–111)
GFR, EST AFRICAN AMERICAN: 25 mL/min — AB (ref >60)
GFR, EST NON AFRICAN AMERICAN: 22 mL/min — AB (ref >60)
Glucose, Bld: 139 mg/dL — ABNORMAL HIGH (ref 65–99)
Potassium: 3.5 mmol/L (ref 3.5–5.1)
Sodium: 143 mmol/L (ref 135–145)

## 2016-03-23 MED ORDER — TORSEMIDE 20 MG PO TABS
40.0000 mg | ORAL_TABLET | Freq: Two times a day (BID) | ORAL | Status: DC
  Administered 2016-03-23 – 2016-03-24 (×2): 40 mg via ORAL
  Filled 2016-03-23 (×2): qty 2

## 2016-03-23 MED ORDER — AMLODIPINE BESYLATE 10 MG PO TABS
10.0000 mg | ORAL_TABLET | Freq: Every day | ORAL | Status: DC
  Administered 2016-03-23 – 2016-03-24 (×2): 10 mg via ORAL
  Filled 2016-03-23 (×2): qty 1

## 2016-03-23 NOTE — Progress Notes (Signed)
Family Medicine Teaching Service Daily Progress Note Intern Pager: [phone]  [name] Medical record number: [id number] Date of birth: May 08, 1950 Age: 66 y.o. Gender: male  Primary Care Provider: [name], MD Consultants: None Code Status: Full  Pt Overview and Major Events to Date:  Admitted 2/16 for CHF exacerbation  Assessment and Plan: Leonard [name] is a 66 y.o. male presenting with acute SOB. PMH is significant for CKD3, HTN, CAD, diastolic HF (EF 23-76% with G2DD ECHO 06/2015), COPD, MGUS, anemia, T2DM.   Dyspnea: Improved, at dry weight. Treating as CHF exacerbation. Satting 90% on RA with minimal crackles at bilateral bases.  - transitioned back to home torsemide 2/18 - Continue telemetry for frequent PVCs - Incentive spirometry - Daily weights 213>211> pending - Strict I/Os; net negative 2.8 L - Wean O2 as able; now on 3L   COPD: Thought actually to be more of cardiac/intermittent asthma at last pulmonology visit; spiriva stopped. - Duonebs q6h prn   T2DM: Lantus increased to 40 U last month. No SSI because concern for how patient is taking. Last hgb A1c 9.7 12/17. Half home insulin at 20U. CBG this AM 102.  - continue with 20U lantus and titrate up as needed - CBGs qACHS  HTN: Continues to be elevated. On 7 medications at home (clonidine 0.3 mg BID, coreg 50 mg BID, hydralazine 100 mg TID, imdur 60 mg daily, amlodipine 10 mg daily, nifedipine 60 mg BID, doxazosin 4 mg BID). Has had negative renal artery U/S.  - Continue coreg, clonidine, imdur, doxazosin  - restarted amlodipine 10mg  daily  CAD: s/p CABG in 2015.  - Continue home asa 81 mg and lipitor 40 mg.   CKDV: SCr 3.68, similar to previous. Left brachiocephalic AV fistula placed 03/13/16. Sees Dr. Marval [name].  - Avoid nephrotoxic medications as able  Hypothyroidism: TSH mildly elevated at 4.11 Jan 2016. Repeat 4.85. - Continue home synthroid 50 mcg daily  Anemia: Stable  at 10.5 (BL ~10). Chronic microcytic anemia. Hx renal failure and MGUS. To follow-up with GI next week (2/21), as had positive FOBT last admission.  - Continue home iron supplementation.   FEN/GI: carb-modified/heart healthy, protonix Prophylaxis: heparin  Disposition: IV diuresis until more euvolemic  Subjective:  [name] feels better this morning and denies any CP, palpitations, NVD or worsening SOB.   Objective: Temp:  [98 F (36.7 C)-98.1 F (36.7 C)] 98 F (36.7 C) (02/18 0313) Pulse Rate:  [71-93] 71 (02/18 0313) Resp:  [18] 18 (02/18 0313) BP: (142-178)/(60-74) 154/61 (02/18 0313) SpO2:  [92 %-95 %] 93 % (02/18 0313) Physical Exam: General: 66yo M lying flat in bed in NAD Cardiovascular: RRR, S1, S2, no m/r/g Respiratory: Crackles at lung bases bilaterally with decreased breath sounds R > L.  Abdomen: +BS, soft, NT, ND, no rebound or guarding Extremities: no edema in LE  Laboratory:  Recent Labs Lab 03/21/16 1937 03/22/16 0348  WBC 6.7 7.1  HGB 10.5* 10.5*  HCT 33.9* 34.1*  PLT 171 148*    Recent Labs Lab 03/21/16 1937 03/22/16 0348  NA 138 141  K 4.3 3.8  CL 107 110  CO2 16* 18*  BUN 69* 67*  CREATININE 3.68* 3.38*  CALCIUM 8.6* 8.6*  GLUCOSE 151* 161*    Imaging/Diagnostic Tests: Dg Chest 2 View  Result Date: 03/21/2016 CLINICAL DATA:  Shortness of breath today. No chest pain or fever. History of congestive heart failure, diabetes and skin cancer EXAM: CHEST  2 VIEW COMPARISON:  01/21/2016 and 01/14/2016. FINDINGS: Stable cardiomegaly post median sternotomy and CABG. There is persistent pulmonary edema with an enlarging right pleural effusion. Small left pleural effusion appears unchanged. No evidence of pneumothorax. The bones appear unchanged. IMPRESSION: Persistent cardiomegaly, edema and enlarging right pleural effusion, again consistent with congestive heart failure. Electronically Signed   By: Richardean Sale M.D.   On: 03/21/2016 20:04     Eloise Levels, MD 03/23/2016, 6:55 AM PGY-1, [place] Intern pager: [phone], text pages welcome

## 2016-03-24 ENCOUNTER — Telehealth: Payer: Self-pay | Admitting: Family Medicine

## 2016-03-24 ENCOUNTER — Encounter: Payer: Medicaid Other | Admitting: Vascular Surgery

## 2016-03-24 ENCOUNTER — Telehealth: Payer: Self-pay

## 2016-03-24 LAB — GLUCOSE, CAPILLARY
Glucose-Capillary: 142 mg/dL — ABNORMAL HIGH (ref 65–99)
Glucose-Capillary: 240 mg/dL — ABNORMAL HIGH (ref 65–99)

## 2016-03-24 LAB — BASIC METABOLIC PANEL
Anion gap: 11 (ref 5–15)
BUN: 40 mg/dL — AB (ref 6–20)
CALCIUM: 8.8 mg/dL — AB (ref 8.9–10.3)
CO2: 24 mmol/L (ref 22–32)
CREATININE: 2.48 mg/dL — AB (ref 0.61–1.24)
Chloride: 108 mmol/L (ref 101–111)
GFR calc Af Amer: 30 mL/min — ABNORMAL LOW (ref >60)
GFR, EST NON AFRICAN AMERICAN: 26 mL/min — AB (ref >60)
GLUCOSE: 153 mg/dL — AB (ref 65–99)
Potassium: 3.5 mmol/L (ref 3.5–5.1)
Sodium: 143 mmol/L (ref 135–145)

## 2016-03-24 MED ORDER — POTASSIUM CHLORIDE CRYS ER 20 MEQ PO TBCR
40.0000 meq | EXTENDED_RELEASE_TABLET | Freq: Once | ORAL | Status: AC
Start: 2016-03-24 — End: 2016-03-24
  Administered 2016-03-24: 40 meq via ORAL
  Filled 2016-03-24: qty 2

## 2016-03-24 NOTE — Telephone Encounter (Signed)
[facility], [employer] for [employer] care need new orders to keep tele monitoring Mr. Olivar. Paperwork was faxed Friday. Call [phone] if questions. Ottis Stain, CMA

## 2016-03-24 NOTE — Telephone Encounter (Signed)
Orders filled out and placed in "to be faxed" pile.

## 2016-03-24 NOTE — Discharge Summary (Signed)
[facility] Discharge Summary  [name] Medical record number: [id number] Date of birth: 09-19-1950 Age: 66 y.o. Gender: male Date of Admission: 03/21/2016  Date of Discharge: 03/24/16 Admitting Physician: Lupita Dawn, MD  Primary Care Provider: [name], MD Consultants: None  Indication for Hospitalization: Heart failure exacerbation  Discharge Diagnoses/Problem List:  CHF COPD Type 2 diabetes mellitus Hypertension CAD Chronic kidney disease stage V Hypothyroidism Anemia  Disposition: Discharge home  Discharge Condition: Stable, improved  Discharge Exam:  General: 66yo M lying flat in bed in NAD Cardiovascular: RRR, S1, S2, no m/r/g Respiratory: Faint crackles at lung bases bilaterally with normal work of breathing Abdomen: +BS, soft, NT, ND, no rebound or guarding Extremities: no edema in LE  Brief Hospital Course:  66 y/o male with PMH CKD 3, HTN, CAD, Diastolic CHF, PAD, and [id number] presents with acute dyspnea. Ruled out cardiac involvement with normal EKGs and negative troponins and was treated for CHF exacerbation. Patient reportedly missed his home torsemide doses at home. Patient was admitted and started on IV diuretics and respiratory status greatly improved.  Patient was briefly on oxygen, but transitioned well to room air and had no hypoxia during admission. He was transitioned over to his home torsemide and continued to diuresis well.  All other medical problems were stable during this admission. At the time of discharge he was satting well on room air and was asymptomatic.  Issues for Follow Up:   1. CHF: Admitted for CHF exacerbation likely secondary to missing home torsemide doses. Patient understands that he must take his daily prescribed medications he wants to remain outside of the hospital 2. Hypertension: Patient was on 2 calcium channel blockers.  We discontinued his Procardia upon discharge and  recommended that he continues with amlodipine.   Significant Procedures: none  Significant Labs and Imaging:   Recent Labs Lab 03/21/16 1937 03/22/16 0348  WBC 6.7 7.1  HGB 10.5* 10.5*  HCT 33.9* 34.1*  PLT 171 148*    Recent Labs Lab 03/21/16 1937 03/22/16 0348 03/23/16 0838 03/24/16 0342  NA 138 141 143 143  K 4.3 3.8 3.5 3.5  CL 107 110 109 108  CO2 16* 18* 22 24  GLUCOSE 151* 161* 139* 153*  BUN 69* 67* 50* 40*  CREATININE 3.68* 3.38* 2.85* 2.48*  CALCIUM 8.6* 8.6* 8.9 8.8*   No results found.  Results/Tests Pending at Time of Discharge: none  Discharge Medications:  Allergies as of 03/24/2016      Reactions   No Known Allergies       Medication List    STOP taking these medications   NIFEdipine 60 MG 24 hr tablet Commonly known as:  PROCARDIA XL/ADALAT-CC     TAKE these medications   amLODipine 10 MG tablet Commonly known as:  NORVASC Take 1 tablet (10 mg total) by mouth daily.   aspirin 81 MG tablet TAKE 81 MG BY MOUTH EVERY DAY   atorvastatin 40 MG tablet Commonly known as:  LIPITOR TAKE 1 TABLET BY MOUTH EVERY DAY AT 6 PM What changed:  how much to take  how to take this  when to take this  additional instructions   carvedilol 25 MG tablet Commonly known as:  COREG Take 2 tablets (50 mg total) by mouth 2 (two) times daily with a meal.   cloNIDine 0.2 MG tablet Commonly known as:  CATAPRES TAKE ONE (1) TABLET BY MOUTH TWO (2) TIMES DAILY   doxazosin  8 MG tablet Commonly known as:  CARDURA Take 1 tablet (8 mg total) by mouth 2 (two) times daily.   ferrous sulfate 325 (65 FE) MG tablet Commonly known as:  FERROUSUL Take 1 tablet (325 mg total) by mouth 2 (two) times daily with a meal.   hydrALAZINE 100 MG tablet Commonly known as:  APRESOLINE TAKE ONE TABLET BY MOUTH EVERY EIGHT HOURS What changed:  how much to take  how to take this  when to take this  additional instructions   insulin aspart 100 UNIT/ML  FlexPen Commonly known as:  NOVOLOG FLEXPEN Inject 12 Units into the skin 3 (three) times daily with meals.   isosorbide mononitrate 60 MG 24 hr tablet Commonly known as:  IMDUR Take 60 mg by mouth daily.   LANTUS SOLOSTAR 100 UNIT/ML Solostar Pen Generic drug:  Insulin Glargine Inject 40 Units into the skin at bedtime.   levothyroxine 50 MCG tablet Commonly known as:  SYNTHROID, LEVOTHROID TAKE ONE (1) TABLET BY MOUTH EVERY DAY BEFORE BREAKFAST   nitroGLYCERIN 0.4 MG SL tablet Commonly known as:  NITROSTAT Place 1 tablet (0.4 mg total) under the tongue every 5 (five) minutes as needed for chest pain.   pantoprazole 40 MG tablet Commonly known as:  PROTONIX TAKE 1 TABLET BY MOUTH DAILY What changed:  See the new instructions.   potassium chloride SA 20 MEQ tablet Commonly known as:  K-DUR,KLOR-CON Take 30meq (1 tablet ) in the AM and 64meq (1/2 tablet) in the PM What changed:  how much to take  how to take this  when to take this  additional instructions   Tiotropium Bromide Monohydrate 2.5 MCG/ACT Aers Commonly known as:  SPIRIVA RESPIMAT Inhale 2 puffs into the lungs daily. What changed:  when to take this  reasons to take this   torsemide 20 MG tablet Commonly known as:  DEMADEX Take 2 tablets (40 mg total) by mouth 2 (two) times daily. May take an extra 20 mg Daily for weight gain above 212 lbs       Discharge Instructions: Please refer to Patient Instructions section of EMR for full details.  Patient was counseled important signs and symptoms that should prompt return to medical care, changes in medications, dietary instructions, activity restrictions, and follow up appointments.   Follow-Up Appointments:   Family medicine center with Dr. Juanito Doom @2 :30PM 04/04/16  Eloise Levels, MD 03/24/2016, 12:21 PM PGY-1, [place]

## 2016-03-24 NOTE — Progress Notes (Signed)
Family Medicine Teaching Service Daily Progress Note Intern Pager: [phone]  [name] Medical record number: [id number] Date of birth: 05-27-1950 Age: 66 y.o. Gender: male  Primary Care Provider: [name], MD Consultants: None Code Status: Full  Pt Overview and Major Events to Date:  Admitted 2/16 for CHF exacerbation  Assessment and Plan: NATHANYL ANDUJO is a 66 y.o. male presenting with acute SOB. PMH is significant for CKD3, HTN, CAD, diastolic HF (EF 47-42% with G2DD ECHO 06/2015), COPD, MGUS, anemia, T2DM.   Dyspnea: Improved, at dry weight. Treating as CHF exacerbation. Satting 100% on RA with minimal crackles at bilateral bases.  - continue home torsemide - Continue telemetry for frequent PVCs - Incentive spirometry - Daily weights 213>211> pending - Strict I/Os; net negative 3.1 L  COPD: Thought actually to be more of cardiac/intermittent asthma at last pulmonology visit; spiriva stopped. - Duonebs q6h prn   T2DM: Lantus increased to 40 U last month. No SSI because concern for how patient is taking. Last hgb A1c 9.7 12/17. Half home insulin at 20U. CBG this AM 142 - continue with 20U lantus and titrate up as needed - CBGs qACHS  HTN: Continues to be elevated. On 7 medications at home (clonidine 0.3 mg BID, coreg 50 mg BID, hydralazine 100 mg TID, imdur 60 mg daily, amlodipine 10 mg daily, nifedipine 60 mg BID, doxazosin 4 mg BID). Has had negative renal artery U/S. 162/69 this AM.  - Continue coreg, clonidine, imdur, doxazosin  - restarted amlodipine 10mg  yesterday  CAD: s/p CABG in 2015.  - Continue home asa 81 mg and lipitor 40 mg.   CKDV: SCr 3.68, similar to previous. Left brachiocephalic AV fistula placed 03/13/16. Sees Dr. Marval Regal.   - Avoid nephrotoxic medications as able  Hypothyroidism: TSH mildly elevated at 4.11 Jan 2016. Repeat 4.85. - Continue home synthroid 50 mcg daily  Anemia: Stable at 10.5 (BL ~10). Chronic  microcytic anemia. Hx renal failure and MGUS. To follow-up with GI next week (2/21), as had positive FOBT last admission.  - Continue home iron supplementation.   FEN/GI: carb-modified/heart healthy, protonix Prophylaxis: heparin  Disposition: discharge today pending clinical improvement  Subjective:  Mr. Robbins continues to feel well this morning and denies any CP, palpitations, NVD or worsening SOB.   Objective: Temp:  [97.8 F (36.6 C)-98.4 F (36.9 C)] 98.4 F (36.9 C) (02/19 0444) Pulse Rate:  [63-72] 63 (02/19 0444) Resp:  [18] 18 (02/19 0444) BP: (155-172)/(63-81) 162/69 (02/19 0444) SpO2:  [90 %-100 %] 100 % (02/19 0444) Physical Exam: General: 65yo M lying flat in bed in NAD Cardiovascular: RRR, S1, S2, no m/r/g Respiratory: Faint crackles at lung bases bilaterally  Abdomen: +BS, soft, NT, ND, no rebound or guarding Extremities: no edema in LE  Laboratory:  Recent Labs Lab 03/21/16 1937 03/22/16 0348  WBC 6.7 7.1  HGB 10.5* 10.5*  HCT 33.9* 34.1*  PLT 171 148*    Recent Labs Lab 03/22/16 0348 03/23/16 0838 03/24/16 0342  NA 141 143 143  K 3.8 3.5 3.5  CL 110 109 108  CO2 18* 22 24  BUN 67* 50* 40*  CREATININE 3.38* 2.85* 2.48*  CALCIUM 8.6* 8.9 8.8*  GLUCOSE 161* 139* 153*    Imaging/Diagnostic Tests: Dg Chest 2 View  Result Date: 03/21/2016 CLINICAL DATA:  Shortness of breath today. No chest pain or fever. History of congestive heart failure, diabetes and skin cancer EXAM: CHEST  2 VIEW COMPARISON:  01/21/2016 and  01/14/2016. FINDINGS: Stable cardiomegaly post median sternotomy and CABG. There is persistent pulmonary edema with an enlarging right pleural effusion. Small left pleural effusion appears unchanged. No evidence of pneumothorax. The bones appear unchanged. IMPRESSION: Persistent cardiomegaly, edema and enlarging right pleural effusion, again consistent with congestive heart failure. Electronically Signed   By: Richardean Sale M.D.   On:  03/21/2016 20:04    Eloise Levels, MD 03/24/2016, 7:09 AM PGY-1, [place] Intern pager: [phone], text pages welcome

## 2016-03-24 NOTE — Progress Notes (Deleted)
    Postoperative Access Visit   History of Present Illness  Leonard [name] is a 66 y.o. year old male who presents for postoperative follow-up for: L BC AVF (Date: 02/11/16).  The patient's wounds are *** healed.  The patient notes *** steal symptoms.  The patient is *** able to complete their activities of daily living.  The patient's current symptoms are: ***.  For VQI Use Only  PRE-ADM LIVING: {VQI Pre-admission Living:20973}  AMB STATUS: {VQI Ambulatory Status:20974}  Physical Examination There were no vitals filed for this visit.  LUE: Incision is *** healed, skin feels ***, hand grip is ***/5, sensation in digits is *** intact, ***palpable thrill, bruit can *** be auscultated   Medical Decision Making  Leonard [name] is a 66 y.o. year old male who presents s/p L BC AVF.   The patient's access is *** ready for use.  The patient's tunneled dialysis catheter can be removed after two successful cannulations and completed dialysis treatments.  Thank you for allowing [place] to participate in this patient's care.  Adele Barthel, MD, FACS [facility] Office: [phone] Pager: [phone]

## 2016-03-24 NOTE — Discharge Instructions (Signed)
You were admitted to the hospital for a heart failure exacerbation. You were given diuretics (medicine to take off fluid).  Your breathing is back to your baseline and your lungs sound good.  Continue with your home medications.   You have a follow up appointment with your primary doctor, Leonard [name] at 2:30PM on 04/04/16.     Heart Failure Heart failure is a condition in which the heart has trouble pumping blood because it has become weak or stiff. This means that the heart does not pump blood efficiently for the body to work well. For some people with heart failure, fluid may back up into the lungs and there may be swelling (edema) in the lower legs. Heart failure is usually a long-term (chronic) condition. It is important for you to take good care of yourself and follow the treatment plan from your health care provider. What are the causes? This condition is caused by some health problems, including:  High blood pressure (hypertension). Hypertension causes the heart muscle to work harder than normal. High blood pressure eventually causes the heart to become stiff and weak.  Coronary artery disease (CAD). CAD is the buildup of cholesterol and fat (plaques) in the arteries of the heart.  Heart attack (myocardial infarction). Injured tissue, which is caused by the heart attack, does not contract as well and the heart's ability to pump blood is weakened.  Abnormal heart valves. When the heart valves do not open and close properly, the heart muscle must pump harder to keep the blood flowing.  Heart muscle disease (cardiomyopathy or myocarditis). Heart muscle disease is damage to the heart muscle from a variety of causes, such as drug or alcohol abuse, infections, or unknown causes. These can increase the risk of heart failure.  Lung disease. When the lungs do not work properly, the heart must work harder. What increases the risk? Risk of heart failure increases as a person ages. This  condition is also more likely to develop in people who:  Are overweight.  Are male.  Smoke or chew tobacco.  Abuse alcohol or illegal drugs.  Have taken medicines that can damage the heart, such as chemotherapy drugs.  Have diabetes.  High blood sugar (glucose) is associated with high fat (lipid) levels in the blood.  Diabetes can also damage tiny blood vessels that carry nutrients to the heart muscle.  Have abnormal heart rhythms.  Have thyroid problems.  Have low blood counts (anemia). What are the signs or symptoms? Symptoms of this condition include:  Shortness of breath with activity, such as when climbing stairs.  Persistent cough.  Swelling of the feet, ankles, legs, or abdomen.  Unexplained weight gain.  Difficulty breathing when lying flat (orthopnea).  Waking from sleep because of the need to sit up and get more air.  Rapid heartbeat.  Fatigue and loss of energy.  Feeling light-headed, dizzy, or close to fainting.  Loss of appetite.  Nausea.  Increased urination during the night (nocturia).  Confusion. How is this diagnosed? This condition is diagnosed based on:  Medical history, symptoms, and a physical exam.  Diagnostic tests, which may include:  Echocardiogram.  Electrocardiogram (ECG).  Chest X-ray.  Blood tests.  Exercise stress test.  Radionuclide scans.  Cardiac catheterization and angiogram. How is this treated? Treatment for this condition is aimed at managing the symptoms of heart failure. Medicines, behavioral changes, or other treatments may be necessary to treat heart failure. Medicines  These may include:  Angiotensin-converting enzyme (ACE) inhibitors.  This type of medicine blocks the effects of a blood protein called angiotensin-converting enzyme. ACE inhibitors relax (dilate) the blood vessels and help to lower blood pressure.  Angiotensin receptor blockers (ARBs). This type of medicine blocks the actions of a  blood protein called angiotensin. ARBs dilate the blood vessels and help to lower blood pressure.  Water pills (diuretics). Diuretics cause the kidneys to remove salt and water from the blood. The extra fluid is removed through urination, leaving a lower volume of blood that the heart has to pump.  Beta blockers. These improve heart muscle strength and they prevent the heart from beating too quickly.  Digoxin. This increases the force of the heartbeat. Healthy behavior changes  These may include:  Reaching and maintaining a healthy weight.  Stopping smoking or chewing tobacco.  Eating heart-healthy foods.  Limiting or avoiding alcohol.  Stopping use of street drugs (illegal drugs).  Physical activity. Other treatments  These may include:  Surgery to open blocked coronary arteries or repair damaged heart valves.  Placement of a biventricular pacemaker to improve heart muscle function (cardiac resynchronization therapy). This device paces both the right ventricle and left ventricle.  Placement of a device to treat serious abnormal heart rhythms (implantable cardioverter defibrillator, or ICD).  Placement of a device to improve the pumping ability of the heart (left ventricular assist device, or LVAD).  Heart transplant. This can cure heart failure, and it is considered for certain patients who do not improve with other therapies. Follow these instructions at home: Medicines  Take over-the-counter and prescription medicines only as told by your health care provider. Medicines are important in reducing the workload of your heart, slowing the progression of heart failure, and improving your symptoms.  Do not stop taking your medicine unless your health care provider told you to do that.  Do not skip any dose of medicine.  Refill your prescriptions before you run out of medicine. You need your medicines every day. Eating and drinking  Eat heart-healthy foods. Talk with a  dietitian to make an eating plan that is right for you.  Choose foods that contain no trans fat and are low in saturated fat and cholesterol. Healthy choices include fresh or frozen fruits and vegetables, fish, lean meats, legumes, fat-free or low-fat dairy products, and whole-grain or high-fiber foods.  Limit salt (sodium) if directed by your health care provider. Sodium restriction may reduce symptoms of heart failure. Ask a dietitian to recommend heart-healthy seasonings.  Use healthy cooking methods instead of frying. Healthy methods include roasting, grilling, broiling, baking, poaching, steaming, and stir-frying.  Limit your fluid intake if directed by your health care provider. Fluid restriction may reduce symptoms of heart failure. Lifestyle  Stop smoking or using chewing tobacco. Nicotine and tobacco can damage your heart and your blood vessels. Do not use nicotine gum or patches before talking to your health care provider.  Limit alcohol intake to no more than 1 drink per day for non-pregnant women and 2 drinks per day for men. One drink equals 12 oz of beer, 5 oz of wine, or 1 oz of hard liquor.  Drinking more than that is harmful to your heart. Tell your health care provider if you drink alcohol several times a week.  Talk with your health care provider about whether any level of alcohol use is safe for you.  If your heart has already been damaged by alcohol or you have severe heart failure, drinking alcohol should be stopped completely.  Stop use of illegal drugs.  Lose weight if directed by your health care provider. Weight loss may reduce symptoms of heart failure.  Do moderate physical activity if directed by your health care provider. People who are elderly and people with severe heart failure should consult with a health care provider for physical activity recommendations. Monitor important information  Weigh yourself every day. Keeping track of your weight daily helps  you to notice excess fluid sooner.  Weigh yourself every morning after you urinate and before you eat breakfast.  Wear the same amount of clothing each time you weigh yourself.  Record your daily weight. Provide your health care provider with your weight record.  Monitor and record your blood pressure as told by your health care provider.  Check your pulse as told by your health care provider. Dealing with extreme temperatures  If the weather is extremely hot:  Avoid vigorous physical activity.  Use air conditioning or fans or seek a cooler location.  Avoid caffeine and alcohol.  Wear loose-fitting, lightweight, and light-colored clothing.  If the weather is extremely cold:  Avoid vigorous physical activity.  Layer your clothes.  Wear mittens or gloves, a hat, and a scarf when you go outside.  Avoid alcohol. General instructions  Manage other health conditions such as hypertension, diabetes, thyroid disease, or abnormal heart rhythms as told by your health care provider.  Learn to manage stress. If you need help to do this, ask your health care provider.  Plan rest periods when fatigued.  Get ongoing education and support as needed.  Participate in or seek rehabilitation as needed to maintain or improve independence and quality of life.  Stay up to date with immunizations. Keeping current on pneumococcal and influenza immunizations is especially important to prevent respiratory infections.  Keep all follow-up visits as told by your health care provider. This is important. Contact a health care provider if:  You have a rapid weight gain.  You have increasing shortness of breath that is unusual for you.  You are unable to participate in your usual physical activities.  You tire easily.  You cough more than normal, especially with physical activity.  You have any swelling or more swelling in areas such as your hands, feet, ankles, or abdomen.  You are unable  to sleep because it is hard to breathe.  You feel like your heart is beating quickly (palpitations).  You become dizzy or light-headed when you stand up. Get help right away if:  You have difficulty breathing.  You notice or your family notices a change in your awareness, such as having trouble staying awake or having difficulty with concentration.  You have pain or discomfort in your chest.  You have an episode of fainting (syncope). This information is not intended to replace advice given to you by your health care provider. Make sure you discuss any questions you have with your health care provider. Document Released: 01/20/2005 Document Revised: 09/25/2015 Document Reviewed: 08/15/2015 Elsevier Interactive Patient Education  2017 [employer].

## 2016-03-24 NOTE — Telephone Encounter (Signed)
**  PCP Note**  Was informed that patient was in the hospital over the weekend. He was discharged this morning. Called patient to see how he is doing and if he has any questions. Patient states he feels much better now after getting some fluid off, he is relaxing at home. He had no questions at this time. He states that he will see me in a week for follow up. Asked patient to please call with any future concerns or questions that may come up.   Smitty Cords, MD [place], PGY-2

## 2016-03-24 NOTE — Progress Notes (Signed)
Transitions of Care Pharmacy Note  Plan:  Educated on taking last torsemide dose before 4 PM Addressed concerns regarding volume of medications --------------------------------------------- CROSS JORGE is an 66 y.o. male who presents with a chief complaint CHF exacerbation. In anticipation of discharge, pharmacy has reviewed this patient's prior to admission medication history, as well as current inpatient medications listed per [facility].  Current medication indications, dosing, frequency, and notable side effects reviewed with patient. patient verbalized understanding of current inpatient medication regimen and is aware that the After Visit Summary when presented, will represent the most accurate medication list at discharge.   KOHEI ANTONELLIS expressed concerns regarding number of medications he is taking. Ensured patient that only necessary medications are being continued.   Assessment: Understanding of regimen: good Understanding of indications: good Potential of compliance: good Barriers to Obtaining Medications: No  Patient instructed to contact inpatient pharmacy team with further questions or concerns if needed.    Time spent preparing for discharge counseling: 10 mins Time spent counseling patient: 10 mins   Thank you for allowing pharmacy to be a part of this patient's care.  Carlean Jews, Pharm.D. PGY1 Pharmacy Resident 2/19/20183:22 PM Pager [phone]

## 2016-03-24 NOTE — Progress Notes (Signed)
Discharge instructions (including medications) discussed with and copy provided to patient/caregiver 

## 2016-03-24 NOTE — Telephone Encounter (Signed)
Would like to talk to Dr Juanito Doom about the appts that were scheduled prior to his admission.  Sister wants to talk to someone that is representing him.  Not really sure what she was asking.

## 2016-03-24 NOTE — Telephone Encounter (Signed)
Called patient's sister back with permission from patient. Patient's sister was concerned because he hasn't had a colonoscopy yet. She was upset that he did not have a colonoscopy while inpatient. Discussed with sister that they do not typically do routine colonoscopies when inpatient and advised that she call his GI office for scheduling. His sister expressed good understanding and was appreciative of the call.

## 2016-03-25 ENCOUNTER — Other Ambulatory Visit (HOSPITAL_COMMUNITY): Payer: Self-pay

## 2016-03-25 NOTE — Progress Notes (Signed)
Paramedicine Encounter    Patient ID: Leonard [name], male    DOB: 1950-09-25, 66 y.o.   MRN: [id number]   Patient Care Team: [name], MD as PCP - General (Family Medicine) [name], MD as Consulting Physician (Nephrology)  Patient Active Problem List   Diagnosis Date Noted  . Pleural effusion   . Oxygen desaturation   . CHF exacerbation ([place]) 03/21/2016  . Morbid obesity due to excess calories ([place]) 03/17/2016  . Anemia due to stage 4 chronic kidney disease treated with darbepoetin ([place]) 02/28/2016  . Fatigue 01/21/2016  . Diabetic retinopathy ([place]) 11/13/2015  . Dyspnea 08/28/2015  . CHF (congestive heart failure) ([place]) 08/27/2015  . Iron deficiency anemia 08/03/2015  . Claudication ([place])   . Type 2 diabetes mellitus with stage 3 chronic kidney disease, with long-term current use of insulin ([place])   . Secondary hypertension   . Hematuria 06/27/2015  . Skin lesion of face 02/22/2015  . 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 Anemia 01/15/2015  . CKD (chronic kidney disease) stage 4, GFR 15-29 ml/min (HCC) 01/15/2015  . Hypothyroidism 11/22/2014  . Acute on chronic diastolic congestive heart failure ([place]) 07/04/2014  . COPD GOLD III  02/16/2014  . History of tobacco use 08/23/2013  . 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 CAD (coronary artery disease) 04/06/2013  . PVD - hx of Rt SFA PTA and s/p Rt 4-5th toe amp Dec 2014 04/05/2013  . Essential hypertension 11/25/2012  . Mixed hyperlipidemia 07/17/2008    Current Outpatient Prescriptions:  .  amLODipine (NORVASC) 10 MG tablet, Take 1 tablet (10 mg total) by mouth daily., Disp: 30 tablet, Rfl: 6 .  aspirin 81 MG tablet, TAKE 81 MG BY MOUTH EVERY DAY, Disp: 30 tablet, Rfl: 3 .  atorvastatin (LIPITOR) 40 MG tablet, TAKE 1 TABLET BY MOUTH EVERY DAY AT 6 PM (Patient taking differently: Take 40 mg by mouth daily at 6 PM. ), Disp: 90 tablet, Rfl: 3 .  carvedilol (COREG) 25 MG tablet, Take 2  tablets (50 mg total) by mouth 2 (two) times daily with a meal., Disp: 120 tablet, Rfl: 3 .  cloNIDine (CATAPRES) 0.2 MG tablet, TAKE ONE (1) TABLET BY MOUTH TWO (2) TIMES DAILY, Disp: 30 tablet, Rfl: 1 .  doxazosin (CARDURA) 8 MG tablet, Take 1 tablet (8 mg total) by mouth 2 (two) times daily., Disp: 60 tablet, Rfl: 6 .  ferrous sulfate (FERROUSUL) 325 (65 FE) MG tablet, Take 1 tablet (325 mg total) by mouth 2 (two) times daily with a meal., Disp: 60 tablet, Rfl: 3 .  hydrALAZINE (APRESOLINE) 100 MG tablet, TAKE ONE TABLET BY MOUTH EVERY EIGHT HOURS (Patient taking differently: Take 100 mg by mouth every 8 (eight) hours. ), Disp: 90 tablet, Rfl: 3 .  insulin aspart (NOVOLOG FLEXPEN) 100 UNIT/ML FlexPen, Inject 12 Units into the skin 3 (three) times daily with meals., Disp: , Rfl:  .  Insulin Glargine (LANTUS SOLOSTAR) 100 UNIT/ML Solostar Pen, Inject 40 Units into the skin at bedtime., Disp: , Rfl:  .  isosorbide mononitrate (IMDUR) 60 MG 24 hr tablet, Take 60 mg by mouth daily., Disp: , Rfl:  .  levothyroxine (SYNTHROID, LEVOTHROID) 50 MCG tablet, TAKE ONE (1) TABLET BY MOUTH EVERY DAY BEFORE BREAKFAST, Disp: 30 tablet, Rfl: 3 .  nitroGLYCERIN (NITROSTAT) 0.4 MG SL tablet, Place 1 tablet (0.4 mg total) under the tongue every 5 (five) minutes as needed for chest pain., Disp: 30 tablet, Rfl: 12 .  pantoprazole (PROTONIX) 40 MG tablet, TAKE 1 TABLET BY MOUTH DAILY (Patient taking differently: TAKE 40 MG BY MOUTH DAILY), Disp: 30 tablet, Rfl: 3 .  potassium chloride SA (K-DUR,KLOR-CON) 20 MEQ tablet, Take 30meq (1 tablet ) in the AM and 84meq (1/2 tablet) in the PM (Patient taking differently: Take 10-20 mEq by mouth See admin instructions. Take 83meq (1 tablet ) in the AM and 22meq (1/2 tablet) in the PM), Disp: 135 tablet, Rfl: 3 .  Tiotropium Bromide Monohydrate (SPIRIVA RESPIMAT) 2.5 MCG/ACT AERS, Inhale 2 puffs into the lungs daily. (Patient taking differently: Inhale 2 puffs into the lungs daily as  needed (occasional use). ), Disp: 1 Inhaler, Rfl: 0 .  torsemide (DEMADEX) 20 MG tablet, Take 2 tablets (40 mg total) by mouth 2 (two) times daily. May take an extra 20 mg Daily for weight gain above 212 lbs, Disp: 130 tablet, Rfl: 3 Allergies  Allergen Reactions  . No Known Allergies      Social History   Social History  . Marital status: Single    Spouse name: N/A  . Number of children: N/A  . Years of education: N/A   Occupational History  . Not on file.   Social History Main Topics  . Smoking status: Former Smoker    Packs/day: 1.00    Years: 46.00    Types: Cigarettes    Start date: 02/04/1967    Quit date: 02/03/2013  . Smokeless tobacco: Never Used     Comment: Doing well with quitting.  . Alcohol use No  . Drug use: No  . Sexual activity: Not Currently   Other Topics Concern  . Not on file   Social History Narrative   Lives with sister, [name] in [place].  Retired - former Sports coach.    Physical Exam  Eyes: Pupils are equal, round, and reactive to light.  Pulmonary/Chest: No respiratory distress. He has no wheezes. He has no rales.  Abdominal: He exhibits no distension. There is no tenderness. There is no guarding.  Musculoskeletal: He exhibits no edema.  Skin: Skin is warm and dry. He is not diaphoretic.        SAFE - 03/18/16 1200      Situation   Admitting diagnosis chf   Heart failure history Exisiting   Comorbidities Anemia;Atrial fibillation;CKD/renal insufficiency;COPD;DM;HTN   Readmitted within 30 days No   Hospital admission within past 12 months Yes   number of hospital admissions 4   number of ED visits 4   Target Weight 212 lbs     Assessment   Lives alone Yes   Primary support person sister: betty Stallone   Mode of transportation other   Other services involved Other  Home aide (bathe, clean)   [place];Wheelchair     [employer]   Weighs self daily Yes   Scale provided Yes   Records on weight chart No      Resources   Has "Living better w/heart failure" book Yes   Has HF Zone tool Yes   Able to identify yellow zone signs/when to call MD Yes   Records zone daily Yes     Medications   Uses a pill box Yes   Who stocks the pill box --  [facility]   Pill box checked this visit Yes   Pill box refilled this visit Yes   Difficulty obtaining medications No   Mail order medications Yes   New medications at home today No   Next  scheduled delivery of medications --  no refills needed   Missed one or more doses of medications per week Yes   How many missed doses this week 6     Nutrition   Patient receives meals on wheels No   Patient follows low sodium diet No   Has foods at home that meet the current recommended diet No   Patient follows low sugar/card diet No   Nutritional concerns/issues --  pt refuses to eat low sodium diet     Activity Level   ADL's/Mobility Independent   How many feet can patient ambulate --  200   Typical activity level --  normal   Barriers --  none   Actvity tolerance: NYHA Class 3     Urine   Difficulty urinating No   Changes in urine None     Time spent with patient   Time spent with patient  50 Minutes        Future Appointments Date Time Provider [place]  03/27/2016 2:45 PM [facility] LAB 5 [facility] None  03/27/2016 3:15 PM [facility] FLUSH NURSE [facility] None  04/04/2016 2:30 PM [name], MD [facility] [place]  04/10/2016 2:45 PM [facility] ONLY [facility] None  04/10/2016 3:15 PM [facility] INJ NURSE [facility] None  04/11/2016 10:45 AM Gardiner Barefoot, DPM [facility]  04/18/2016 1:30 PM [name], MD [facility] [place]  04/24/2016 2:45 PM [facility] ONLY [facility] None  04/24/2016 3:15 PM [facility] INJ NURSE [facility] None  05/08/2016 2:45 PM [facility] ONLY [facility] None  05/08/2016 3:15 PM [facility] INJ NURSE [facility] None  05/22/2016 10:30 AM [facility] LAB 4 [facility] None  05/22/2016 11:15 AM Ni  Gorsuch, MD [facility] None  05/22/2016 11:45 AM [facility] INJ NURSE [facility] None  06/13/2016 11:00 AM [facility] VASC 3 [facility]  06/13/2016 11:45 AM Sharmon Leyden Nickel, NP [facility]   ATF pt CAO x4 c/o tiredness since he was discharged yesterday. Pt stated that he has a "head cold" and he feels bad due to that. Pt has not taken any rx for the head cold and he was advised what rx he can take.  Pt called 911 due to SOB last Friday.  Pt denies SOB, headaches, dizziness, and chest pain. Nifedipine was taken out of pt's pill box due to d/c orders (03/24/16). Pt legs and feet has no edema and he was advised to continue taking his medications as prescribed and to stick with the low sodium diet.    Refill called: 211941 ferr, 346799 iso, 351011 potas cbg 98  BP 112/60 (BP Location: Right Arm, Patient Position: Sitting, Cuff Size: Normal)   Pulse 64   Resp 14   Wt 196 lb (88.9 kg)   SpO2 97%   BMI 29.80 kg/m   Weight yesterday-was at the hospital and didn't weigh Last visit [place]: Home visit completed Next visit planned for next tues

## 2016-03-26 ENCOUNTER — Other Ambulatory Visit: Payer: Self-pay | Admitting: Family Medicine

## 2016-03-26 ENCOUNTER — Other Ambulatory Visit (HOSPITAL_COMMUNITY): Payer: Self-pay | Admitting: Student

## 2016-03-26 ENCOUNTER — Encounter (HOSPITAL_COMMUNITY): Payer: Self-pay

## 2016-03-26 ENCOUNTER — Ambulatory Visit (HOSPITAL_COMMUNITY): Admit: 2016-03-26 | Payer: Medicaid Other | Admitting: Gastroenterology

## 2016-03-26 DIAGNOSIS — I5032 Chronic diastolic (congestive) heart failure: Secondary | ICD-10-CM

## 2016-03-26 DIAGNOSIS — I10 Essential (primary) hypertension: Secondary | ICD-10-CM

## 2016-03-26 SURGERY — ESOPHAGOGASTRODUODENOSCOPY (EGD) WITH PROPOFOL
Anesthesia: Monitor Anesthesia Care

## 2016-03-27 ENCOUNTER — Ambulatory Visit (HOSPITAL_BASED_OUTPATIENT_CLINIC_OR_DEPARTMENT_OTHER): Payer: Self-pay

## 2016-03-27 ENCOUNTER — Encounter: Payer: Self-pay | Admitting: Family Medicine

## 2016-03-27 ENCOUNTER — Other Ambulatory Visit (HOSPITAL_BASED_OUTPATIENT_CLINIC_OR_DEPARTMENT_OTHER): Payer: Medicaid Other

## 2016-03-27 ENCOUNTER — Ambulatory Visit (INDEPENDENT_AMBULATORY_CARE_PROVIDER_SITE_OTHER): Payer: Medicaid Other | Admitting: Family Medicine

## 2016-03-27 VITALS — BP 144/62 | HR 69 | Temp 98.0°F | Ht 68.0 in | Wt 203.0 lb

## 2016-03-27 DIAGNOSIS — R05 Cough: Secondary | ICD-10-CM

## 2016-03-27 DIAGNOSIS — I5033 Acute on chronic diastolic (congestive) heart failure: Secondary | ICD-10-CM | POA: Diagnosis present

## 2016-03-27 DIAGNOSIS — D631 Anemia in chronic kidney disease: Secondary | ICD-10-CM

## 2016-03-27 DIAGNOSIS — N184 Chronic kidney disease, stage 4 (severe): Secondary | ICD-10-CM | POA: Diagnosis present

## 2016-03-27 DIAGNOSIS — R059 Cough, unspecified: Secondary | ICD-10-CM

## 2016-03-27 LAB — CBC WITH DIFFERENTIAL/PLATELET
BASO%: 0.8 % (ref 0.0–2.0)
Basophils Absolute: 0 10*3/uL (ref 0.0–0.1)
EOS ABS: 0.3 10*3/uL (ref 0.0–0.5)
EOS%: 5.5 % (ref 0.0–7.0)
HCT: 38.1 % — ABNORMAL LOW (ref 38.4–49.9)
HGB: 11.6 g/dL — ABNORMAL LOW (ref 13.0–17.1)
LYMPH%: 27.7 % (ref 14.0–49.0)
MCH: 24 pg — ABNORMAL LOW (ref 27.2–33.4)
MCHC: 30.4 g/dL — ABNORMAL LOW (ref 32.0–36.0)
MCV: 78.7 fL — ABNORMAL LOW (ref 79.3–98.0)
MONO#: 0.5 10*3/uL (ref 0.1–0.9)
MONO%: 10.2 % (ref 0.0–14.0)
NEUT%: 55.8 % (ref 39.0–75.0)
NEUTROS ABS: 2.9 10*3/uL (ref 1.5–6.5)
NRBC: 0 % (ref 0–0)
PLATELETS: 147 10*3/uL (ref 140–400)
RBC: 4.84 10*6/uL (ref 4.20–5.82)
RDW: 20.7 % — AB (ref 11.0–14.6)
WBC: 5.3 10*3/uL (ref 4.0–10.3)
lymph#: 1.5 10*3/uL (ref 0.9–3.3)

## 2016-03-27 LAB — TECHNOLOGIST REVIEW

## 2016-03-27 MED ORDER — LEVOFLOXACIN 250 MG PO TABS: 250.0000 mg | ORAL_TABLET | Freq: Every day | ORAL | 0 refills | Status: DC

## 2016-03-27 MED ORDER — PREDNISONE 50 MG PO TABS: 50.0000 mg | ORAL_TABLET | Freq: Every day | ORAL | 0 refills | Status: DC

## 2016-03-27 MED ORDER — DARBEPOETIN ALFA 500 MCG/ML IJ SOSY: 500.0000 ug | PREFILLED_SYRINGE | Freq: Once | INTRAMUSCULAR | Status: DC

## 2016-03-27 NOTE — Patient Instructions (Signed)
Thank you so much for coming to visit today! I suspect your symptoms are due to a virus. If your cough worsens or you develop fevers, please fill the prescriptions for Prednisone and Levaquin given. If you develop worsening shortness of breath, please call 911. Please return in 1-2 weeks if no improvement or sooner if symptoms worsen.  Dr. Gerlean Ren

## 2016-03-28 ENCOUNTER — Encounter: Payer: Medicaid Other | Admitting: Vascular Surgery

## 2016-03-30 NOTE — Progress Notes (Signed)
Subjective:     Patient ID: Leonard [name], male   DOB: 1950-04-12, 66 y.o.   MRN: [id number]  HPI [name] is a 66yo male presenting today for cough. History of recent hospitalization noted from 2/16-2/19 for heart failure exacerbation after missing doses of home Torsemide. IV diuresis occurred with transition back to oral diuretics. [name] notes mild productive cough since 2/18. CXR from 2/18 showed moderate diffuse pulmonary edema secondary to CHF exacerbation. Reports he has not missed any doses of his diuretic and on his home scale has continued to lose weight. Denies fever. Denies worsening shortness of breath or wheezing. Has been using Coricidin with improvement of symptoms. History of COPD noted. States he feels fine and that he thinks he only has a mild cold, however his sister made him come for evaluation.  Note sister was not initially present. Sister presented at end of visit and reported he has been having fevers and he has been coughing and short of breath. [name] continues to deny this.   Former Smoker.  Review of Systems Per HPI    Objective:   Physical Exam  Constitutional: He appears well-developed and well-nourished. No distress.  HENT:  Head: Normocephalic and atraumatic.  Cardiovascular: Normal rate and regular rhythm.   No murmur heard. Pulmonary/Chest: Effort normal. No respiratory distress. He has no wheezes.  Abdominal: Soft. He exhibits no distension. There is no tenderness.  Musculoskeletal: He exhibits no edema.  Psychiatric: He has a normal mood and affect. His behavior is normal.      Assessment and Plan:     Suspect viral illness. O2 Sat stable at 98% on room air. Continue treatment with over the counter medications. High risk for re-hospitalization. Differential also includes COPD Exacerbation (productive cough with history of COPD) vs. Pneumonia (cough, no fevers, no infiltrate on recent CXR, recent hospitalization.) About to enter weekend where  he will be unable to return to office if worsening occurs--will give printed prescriptions for Prednisone and Levaquin (renally adjusted) with instructions to only fill if symptoms worsen. Return to the office the beginning of next week if symptoms worsen and he initiates medication. Consider repeat CXR if no improvement.

## 2016-04-01 ENCOUNTER — Other Ambulatory Visit (HOSPITAL_COMMUNITY): Payer: Self-pay

## 2016-04-01 NOTE — Progress Notes (Signed)
Paramedicine Encounter    Patient ID: Leonard [name], male    DOB: [birth year], 66 y.o.   MRN: [id number]   Patient Care Team: [name], MD as PCP - General (Family Medicine) [name], MD as Consulting Physician (Nephrology)  Patient Active Problem List   Diagnosis Date Noted  . Pleural effusion   . Oxygen desaturation   . CHF exacerbation ([place]) 03/21/2016  . Morbid obesity due to excess calories ([place]) 03/17/2016  . Anemia due to stage 4 chronic kidney disease treated with darbepoetin ([place]) 02/28/2016  . Fatigue 01/21/2016  . Diabetic retinopathy ([place]) 11/13/2015  . Dyspnea 08/28/2015  . CHF (congestive heart failure) ([place]) 08/27/2015  . Iron deficiency anemia 08/03/2015  . Claudication ([place])   . Type 2 diabetes mellitus with stage 3 chronic kidney disease, with long-term current use of insulin ([place])   . Secondary hypertension   . Hematuria 06/27/2015  . Skin lesion of face 02/22/2015  . 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 Anemia 01/15/2015  . CKD (chronic kidney disease) stage 4, GFR 15-29 ml/min (HCC) 01/15/2015  . Hypothyroidism 11/22/2014  . Acute on chronic diastolic congestive heart failure ([place]) 07/04/2014  . COPD GOLD III  02/16/2014  . History of tobacco use 08/23/2013  . 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 CAD (coronary artery disease) 04/06/2013  . PVD - hx of Rt SFA PTA and s/p Rt 4-5th toe amp Dec 2014 04/05/2013  . Essential hypertension 11/25/2012  . Mixed hyperlipidemia 07/17/2008    Current Outpatient Prescriptions:  .  amLODipine (NORVASC) 10 MG tablet, Take 1 tablet (10 mg total) by mouth daily., Disp: 30 tablet, Rfl: 6 .  aspirin 81 MG tablet, TAKE 81 MG BY MOUTH EVERY DAY, Disp: 30 tablet, Rfl: 3 .  atorvastatin (LIPITOR) 40 MG tablet, TAKE 1 TABLET BY MOUTH EVERY DAY AT 6 PM (Patient taking differently: Take 40 mg by mouth daily at 6 PM. ), Disp: 90 tablet, Rfl: 3 .  carvedilol (COREG) 25 MG tablet, Take 2  tablets (50 mg total) by mouth 2 (two) times daily with a meal., Disp: 120 tablet, Rfl: 3 .  carvedilol (COREG) 25 MG tablet, TAKE TWO (2) TABLETS BY MOUTH 2 TIMES DAILY WITH A MEAL, Disp: 120 tablet, Rfl: 3 .  cloNIDine (CATAPRES) 0.2 MG tablet, TAKE ONE (1) TABLET BY MOUTH TWO (2) TIMES DAILY, Disp: 30 tablet, Rfl: 1 .  doxazosin (CARDURA) 8 MG tablet, Take 1 tablet (8 mg total) by mouth 2 (two) times daily., Disp: 60 tablet, Rfl: 6 .  ferrous sulfate (FERROUSUL) 325 (65 FE) MG tablet, Take 1 tablet (325 mg total) by mouth 2 (two) times daily with a meal., Disp: 60 tablet, Rfl: 3 .  hydrALAZINE (APRESOLINE) 100 MG tablet, TAKE ONE TABLET BY MOUTH EVERY EIGHT HOURS (Patient taking differently: Take 100 mg by mouth every 8 (eight) hours. ), Disp: 90 tablet, Rfl: 3 .  insulin aspart (NOVOLOG FLEXPEN) 100 UNIT/ML FlexPen, Inject 12 Units into the skin 3 (three) times daily with meals., Disp: , Rfl:  .  Insulin Glargine (LANTUS SOLOSTAR) 100 UNIT/ML Solostar Pen, Inject 40 Units into the skin at bedtime., Disp: , Rfl:  .  isosorbide mononitrate (IMDUR) 60 MG 24 hr tablet, Take 60 mg by mouth daily., Disp: , Rfl:  .  levothyroxine (SYNTHROID, LEVOTHROID) 50 MCG tablet, TAKE ONE (1) TABLET BY MOUTH EVERY DAY BEFORE BREAKFAST, Disp: 30 tablet, Rfl: 3 .  nitroGLYCERIN (NITROSTAT) 0.4 MG SL  tablet, Place 1 tablet (0.4 mg total) under the tongue every 5 (five) minutes as needed for chest pain., Disp: 30 tablet, Rfl: 12 .  pantoprazole (PROTONIX) 40 MG tablet, TAKE ONE (1) TABLET BY MOUTH EVERY DAY, Disp: 30 tablet, Rfl: 1 .  potassium chloride SA (K-DUR,KLOR-CON) 20 MEQ tablet, Take 108meq (1 tablet ) in the AM and 8meq (1/2 tablet) in the PM (Patient taking differently: Take 10-20 mEq by mouth See admin instructions. Take 24meq (1 tablet ) in the AM and 76meq (1/2 tablet) in the PM), Disp: 135 tablet, Rfl: 3 .  torsemide (DEMADEX) 20 MG tablet, Take 2 tablets (40 mg total) by mouth 2 (two) times daily. May take  an extra 20 mg Daily for weight gain above 212 lbs, Disp: 130 tablet, Rfl: 3 .  levofloxacin (LEVAQUIN) 250 MG tablet, Take 1 tablet (250 mg total) by mouth daily. (Patient not taking: Reported on 04/01/2016), Disp: 8 tablet, Rfl: 0 .  predniSONE (DELTASONE) 50 MG tablet, Take 1 tablet (50 mg total) by mouth daily with breakfast., Disp: 5 tablet, Rfl: 0 .  Tiotropium Bromide Monohydrate (SPIRIVA RESPIMAT) 2.5 MCG/ACT AERS, Inhale 2 puffs into the lungs daily. (Patient taking differently: Inhale 2 puffs into the lungs daily as needed (occasional use). ), Disp: 1 Inhaler, Rfl: 0 Allergies  Allergen Reactions  . No Known Allergies      Social History   Social History  . Marital status: Single    Spouse name: N/A  . Number of children: N/A  . Years of education: N/A   Occupational History  . Not on file.   Social History Main Topics  . Smoking status: Former Smoker    Packs/day: 1.00    Years: 46.00    Types: Cigarettes    Start date: 02/04/1967    Quit date: 02/03/2013  . Smokeless tobacco: Never Used     Comment: Doing well with quitting.  . Alcohol use No  . Drug use: No  . Sexual activity: Not Currently   Other Topics Concern  . Not on file   Social History Narrative   Lives with sister, [name] in [place].  Retired - former Sports coach.    Physical Exam  Eyes: Pupils are equal, round, and reactive to light.  Pulmonary/Chest: No respiratory distress. He has no wheezes. He has no rales.  Musculoskeletal: He exhibits edema.  Skin: Skin is warm and dry. He is not diaphoretic.        SAFE - 03/18/16 1200      Situation   Admitting diagnosis chf   Heart failure history Exisiting   Comorbidities Anemia;Atrial fibillation;CKD/renal insufficiency;COPD;DM;HTN   Readmitted within 30 days No   Hospital admission within past 12 months Yes   number of hospital admissions 4   number of ED visits 4   Target Weight 212 lbs     Assessment   Lives alone Yes   Primary  support person sister: betty Hauk   Mode of transportation other   Other services involved Other  Home aide (bathe, clean)   [place];Wheelchair     [employer]   Weighs self daily Yes   Scale provided Yes   Records on weight chart No     Resources   Has "Living better w/heart failure" book Yes   Has HF Zone tool Yes   Able to identify yellow zone signs/when to call MD Yes   Records zone daily Yes     Medications  Uses a pill box Yes   Who stocks the pill box --  [facility]   Pill box checked this visit Yes   Pill box refilled this visit Yes   Difficulty obtaining medications No   Mail order medications Yes   New medications at home today No   Next scheduled delivery of medications --  no refills needed   Missed one or more doses of medications per week Yes   How many missed doses this week 6     Nutrition   Patient receives meals on wheels No   Patient follows low sodium diet No   Has foods at home that meet the current recommended diet No   Patient follows low sugar/card diet No   Nutritional concerns/issues --  pt refuses to eat low sodium diet     Activity Level   ADL's/Mobility Independent   How many feet can patient ambulate --  200   Typical activity level --  normal   Barriers --  none   Actvity tolerance: NYHA Class 3     Urine   Difficulty urinating No   Changes in urine None     Time spent with patient   Time spent with patient  50 Minutes        Future Appointments Date Time Provider [place]  04/04/2016 2:30 PM [name], MD [facility]  04/10/2016 2:45 PM [facility] ONLY [facility] None  04/10/2016 3:15 PM [facility] INJ NURSE [facility] None  04/18/2016 1:30 PM [name], MD [facility] [place]  04/22/2016 2:30 PM Gardiner Barefoot, DPM [facility]  04/24/2016 2:45 PM [facility] ONLY [facility] None  04/24/2016 3:15 PM [facility] INJ NURSE [facility] None  05/08/2016 2:45 PM [facility] ONLY [facility]  None  05/08/2016 3:15 PM [facility] INJ NURSE [facility] None  05/22/2016 10:30 AM [facility] LAB 4 [facility] None  05/22/2016 11:15 AM Ni Gorsuch, MD [facility] None  05/22/2016 11:45 AM [facility] INJ NURSE [facility] None  06/13/2016 11:00 AM [facility] VASC 3 [facility]  06/13/2016 11:45 AM Sharmon Leyden Nickel, NP [facility]    ATF pt CAO x4 with no complaints today. Pt denies sob, headache, dizziness, and chest pain.  Pt has taken all of his rx as prescribed. Pt is still having issues with maintaining a low sodium diet. Pt asked for a slice of peporoni pizza from [employer] prior to [facility] leaving. Pt was reminded of his food choices.  rx bottles and pill box verified. CBG 287. Pt has edema in both lower legs.    Refills called in today: 161096 torsemide, 045409 carvedilol, 811914 pantoprazole  BP 136/74 (BP Location: Right Arm, Patient Position: Sitting)   Pulse 62   Wt 198 lb (89.8 kg)   SpO2 98%   BMI 30.11 kg/m   Weight yesterday-200 Last visit weight-196    ACTION: Home visit completed Next visit planned for next wednesday

## 2016-04-04 ENCOUNTER — Ambulatory Visit (INDEPENDENT_AMBULATORY_CARE_PROVIDER_SITE_OTHER): Payer: Medicaid Other | Admitting: Family Medicine

## 2016-04-04 ENCOUNTER — Telehealth: Payer: Self-pay | Admitting: Family Medicine

## 2016-04-04 ENCOUNTER — Encounter: Payer: Self-pay | Admitting: Family Medicine

## 2016-04-04 DIAGNOSIS — I5032 Chronic diastolic (congestive) heart failure: Secondary | ICD-10-CM | POA: Diagnosis not present

## 2016-04-04 DIAGNOSIS — I10 Essential (primary) hypertension: Secondary | ICD-10-CM

## 2016-04-04 DIAGNOSIS — I5033 Acute on chronic diastolic (congestive) heart failure: Secondary | ICD-10-CM | POA: Diagnosis present

## 2016-04-04 NOTE — Telephone Encounter (Signed)
Pt seeing Dr. Juanito Doom today. Dr. Informed. Ottis Stain, CMA

## 2016-04-04 NOTE — Assessment & Plan Note (Addendum)
Patient well-appearing and no complaints of shortness of breath or chest pain. Baseline pitting edema in bilateral ankles but no crackles heard on lung exam. Of note he weighed 198 pounds at hospital discharge on February 19 and today he is 206.2 pounds which means he is had about an 8 pound weight gain since hospitalization. His baseline weight is difficult to say as his weight fluctuates often. -Reviewed all of patient's medications and combined duplicate medication bottles -We will increase torsemide to 40 mg in the morning and 60 mg at night due to weight gain for the next 3 days -Instructed patient to follow-up at our office early next week

## 2016-04-04 NOTE — Patient Instructions (Signed)
Thank you for coming in today, it was so nice to see you! Today we talked about:   Weight gain: Please take 2 pills of Torsemide in the morning and 3 pills at night. Take these today, tomorrow (Saturday), and Sunday.   Please follow up next week. You can schedule this appointment at the front desk before you leave or call the clinic.  Bring in all your medications or supplements to each appointment for review.   If you have any questions or concerns, please do not hesitate to call the office at [phone]. You can also message me directly via MyChart.   Sincerely,  Smitty Cords, MD

## 2016-04-04 NOTE — Telephone Encounter (Signed)
[facility] is

## 2016-04-04 NOTE — Assessment & Plan Note (Signed)
Controlled and at goal today. BP 130/55. Procardia was discontinued hospital discharge. Reviewed all patient's medications with him today as he brought in his pill bottles, his clonidine was missing from the bottles that he brought in but he thinks that this bottle is at home and he is taking this medication. -Continue current regimen

## 2016-04-04 NOTE — Progress Notes (Signed)
Subjective:    Patient ID: Leonard [name] , male   DOB: Jul 18, 1950 , 66 y.o..   MRN: [id number]  HPI  [name] is here for  Chief Complaint  Patient presents with  . Hospitalization Follow-up    Hospital Follow up for CHF exacerbation:  [name] was admitted to [facility] on 03/21/2016 for congestive heart failure exacerbation. He was found to have unchanged EKGs and negative troponins, no signs of ACS. He was apparently on oxygen for a short amount of time but then transitioned to room air and had no hypoxia. He was given IV diuretics and diuresed a total of  3.1 L. He was transitioned to his home torsemide by discharge on 03/24/2016. Additionally at discharge his Procardia was discontinued.  Since leaving the hospital [name] feels much better. He notes that he has not had any recent shortness of breath. He denies any increased and lower extremity edema. He denies any chest pain. He is eating and drinking a normal amount. [name] that he is getting over a cold and he does have a stuffy nose. He has been taking his medications as prescribed.   Review of Systems: Per HPI. All other systems reviewed and are negative.  Past Medical History: Patient Active Problem List   Diagnosis Date Noted  . Pleural effusion   . Oxygen desaturation   . Morbid obesity due to excess calories ([place]) 03/17/2016  . Anemia due to stage 4 chronic kidney disease treated with darbepoetin ([place]) 02/28/2016  . Fatigue 01/21/2016  . Diabetic retinopathy ([place]) 11/13/2015  . Dyspnea 08/28/2015  . CHF (congestive heart failure) ([place]) 08/27/2015  . Iron deficiency anemia 08/03/2015  . Claudication ([place])   . Type 2 diabetes mellitus with stage 3 chronic kidney disease, with long-term current use of insulin ([place])   . Secondary hypertension   . Hematuria 06/27/2015  . Skin lesion of face 02/22/2015  . 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 Anemia 01/15/2015  .  CKD (chronic kidney disease) stage 4, GFR 15-29 ml/min (HCC) 01/15/2015  . Hypothyroidism 11/22/2014  . Acute on chronic diastolic congestive heart failure ([place]) 07/04/2014  . COPD GOLD III  02/16/2014  . History of tobacco use 08/23/2013  . 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 CAD (coronary artery disease) 04/06/2013  . PVD - hx of Rt SFA PTA and s/p Rt 4-5th toe amp Dec 2014 04/05/2013  . Essential hypertension 11/25/2012  . Mixed hyperlipidemia 07/17/2008    Medications: reviewed and updated Current Outpatient Prescriptions  Medication Sig Dispense Refill  . amLODipine (NORVASC) 10 MG tablet Take 1 tablet (10 mg total) by mouth daily. 30 tablet 6  . aspirin 81 MG tablet TAKE 81 MG BY MOUTH EVERY DAY 30 tablet 3  . atorvastatin (LIPITOR) 40 MG tablet TAKE 1 TABLET BY MOUTH EVERY DAY AT 6 PM (Patient taking differently: Take 40 mg by mouth daily at 6 PM. ) 90 tablet 3  . carvedilol (COREG) 25 MG tablet Take 2 tablets (50 mg total) by mouth 2 (two) times daily with a meal. 120 tablet 3  . doxazosin (CARDURA) 8 MG tablet Take 1 tablet (8 mg total) by mouth 2 (two) times daily. 60 tablet 6  . ferrous sulfate (FERROUSUL) 325 (65 FE) MG tablet Take 1 tablet (325 mg total) by mouth 2 (two) times daily with a meal. 60 tablet 3  . hydrALAZINE (APRESOLINE) 100 MG tablet TAKE ONE TABLET BY MOUTH  EVERY EIGHT HOURS (Patient taking differently: Take 100 mg by mouth every 8 (eight) hours. ) 90 tablet 3  . insulin aspart (NOVOLOG FLEXPEN) 100 UNIT/ML FlexPen Inject 12 Units into the skin 3 (three) times daily with meals.    . Insulin Glargine (LANTUS SOLOSTAR) 100 UNIT/ML Solostar Pen Inject 40 Units into the skin at bedtime.    . isosorbide mononitrate (IMDUR) 60 MG 24 hr tablet Take 60 mg by mouth daily.    Marland Kitchen levothyroxine (SYNTHROID, LEVOTHROID) 50 MCG tablet TAKE ONE (1) TABLET BY MOUTH EVERY DAY BEFORE BREAKFAST 30 tablet 3  . pantoprazole (PROTONIX) 40 MG tablet TAKE ONE (1) TABLET BY MOUTH EVERY DAY 30  tablet 1  . potassium chloride SA (K-DUR,KLOR-CON) 20 MEQ tablet Take 52meq (1 tablet ) in the AM and 26meq (1/2 tablet) in the PM (Patient taking differently: Take 10-20 mEq by mouth See admin instructions. Take 67meq (1 tablet ) in the AM and 54meq (1/2 tablet) in the PM) 135 tablet 3  . torsemide (DEMADEX) 20 MG tablet Take 2 tablets (40 mg total) by mouth 2 (two) times daily. May take an extra 20 mg Daily for weight gain above 212 lbs 130 tablet 3  . cloNIDine (CATAPRES) 0.2 MG tablet TAKE ONE (1) TABLET BY MOUTH TWO (2) TIMES DAILY (Patient not taking: Reported on 04/04/2016) 30 tablet 1  . nitroGLYCERIN (NITROSTAT) 0.4 MG SL tablet Place 1 tablet (0.4 mg total) under the tongue every 5 (five) minutes as needed for chest pain. (Patient not taking: Reported on 04/04/2016) 30 tablet 12  . predniSONE (DELTASONE) 50 MG tablet Take 1 tablet (50 mg total) by mouth daily with breakfast. (Patient not taking: Reported on 04/04/2016) 5 tablet 0  . Tiotropium Bromide Monohydrate (SPIRIVA RESPIMAT) 2.5 MCG/ACT AERS Inhale 2 puffs into the lungs daily. (Patient not taking: Reported on 04/04/2016) 1 Inhaler 0   No current facility-administered medications for this visit.     Social Hx:  reports that he quit smoking about 3 years ago. His smoking use included Cigarettes. He started smoking about 49 years ago. He has a 46.00 pack-year smoking history. He has never used smokeless tobacco.   Objective:   BP (!) 130/56   Pulse 67   Temp 97.9 F (36.6 C) (Oral)   Ht 5\' 8"  (1.727 m)   Wt 206 lb 3.2 oz (93.5 kg)   SpO2 98%   BMI 31.35 kg/m  Physical Exam  Gen: NAD, alert, cooperative with exam, well-appearing HEENT: NCAT, PERRL, clear conjunctiva, oropharynx clear, supple neck Cardiac: Regular rate and rhythm, normal S1/S2, no murmur, 2+ pitting edema in right ankle and 1+ pitting edema in left ankle, capillary refill brisk  Respiratory: Clear to auscultation bilaterally, no wheezes, non-labored  breathing Gastrointestinal: soft, non tender, non distended, bowel sounds present Skin: no rashes, normal turIs gor  Neurological: no gross deficits.  Psych: good insight, normal mood and affect  Assessment & Plan:  CHF (congestive heart failure) ([place]) Patient well-appearing and no complaints of shortness of breath or chest pain. Baseline pitting edema in bilateral ankles but no crackles heard on lung exam. Of note he weighed 198 pounds at hospital discharge on February 19 and today he is 206.2 pounds which means he is had about an 8 pound weight gain since hospitalization. His baseline weight is difficult to say as his weight fluctuates often. -Reviewed all of patient's medications and combined duplicate medication bottles -We will increase torsemide to 40 mg in the morning and  60 mg at night due to weight gain for the next 3 days -Instructed patient to follow-up at our office early next week   Essential hypertension Controlled and at goal today. BP 130/55. Procardia was discontinued hospital discharge. Reviewed all patient's medications with him today as he brought in his pill bottles, his clonidine was missing from the bottles that he brought in but he thinks that this bottle is at home and he is taking this medication. -Continue current regimen  Smitty Cords, MD [place], PGY-2

## 2016-04-04 NOTE — Telephone Encounter (Signed)
[facility] is calling and wanted the doctor to know that the patient has had a 7 lb weight gain in two days. Yesterday 192.5 and today 199.5. jw

## 2016-04-07 ENCOUNTER — Ambulatory Visit (INDEPENDENT_AMBULATORY_CARE_PROVIDER_SITE_OTHER): Payer: Medicaid Other | Admitting: Family Medicine

## 2016-04-07 DIAGNOSIS — I5032 Chronic diastolic (congestive) heart failure: Secondary | ICD-10-CM | POA: Diagnosis not present

## 2016-04-07 DIAGNOSIS — I5033 Acute on chronic diastolic (congestive) heart failure: Secondary | ICD-10-CM | POA: Diagnosis not present

## 2016-04-07 NOTE — Progress Notes (Signed)
    Postoperative Access Visit   History of Present Illness  Leonard [name] is a 66 y.o. year old male who presents for postoperative follow-up for: L BC AVF (Date: 02/11/16).  The patient's wounds are healed.  The patient notes no steal symptoms.  The patient is able to complete their activities of daily living.  The patient's current symptoms are: none.  For VQI Use Only  PRE-ADM LIVING: Home  AMB STATUS: Ambulatory  Physical Examination Vitals:   04/11/16 1108  BP: (!) 161/68  Pulse: 79  Resp: 16  Temp: 98.7 F (37.1 C)    LUE: Incision is healed, skin feels warm, hand grip is 5/5, sensation in digits is intact, palpable thrill, bruit can be auscultated, fistula is visible, radial pulse is palpable  Medical Decision Making  Leonard [name] is a 66 y.o. year old male who presents s/p L BC AVF.   The patient's access is ready for use.  Thank you for allowing [place] to participate in this patient's care.  Adele Barthel, MD, FACS [facility] Office: [phone] Pager: [phone]

## 2016-04-07 NOTE — Progress Notes (Signed)
Subjective:    Patient ID: Leonard [name] , male   DOB: [birth year] , 66 y.o..   MRN: [id number]  HPI  Leonard [name] is here for  Chief Complaint  Patient presents with  . Follow-up    1. Follow up for weight gain: Patient was seen on March 2 for hospital follow-up for CHF exacerbation. At that time his weight was up from discharge. He had no signs of respiratory distress or fluid in his pulmonary exam. He did have baseline pitting edema in both ankles. He was advised to take 40 mg of torsemide in the morning and 60 mg of torsemide at night for the next 3 days. Patient did well with this medication change. He notes that he has not had any shortness of breath or chest pain.  Review of Systems: Per HPI. All other systems reviewed and are negative.   Past Medical History: Patient Active Problem List   Diagnosis Date Noted  . Pleural effusion   . Oxygen desaturation   . Morbid obesity due to excess calories ([place]) 03/17/2016  . Anemia due to stage 4 chronic kidney disease treated with darbepoetin ([place]) 02/28/2016  . Fatigue 01/21/2016  . Diabetic retinopathy ([place]) 11/13/2015  . Dyspnea 08/28/2015  . CHF (congestive heart failure) ([place]) 08/27/2015  . Iron deficiency anemia 08/03/2015  . Claudication ([place])   . Type 2 diabetes mellitus with stage 3 chronic kidney disease, with long-term current use of insulin ([place])   . Secondary hypertension   . Hematuria 06/27/2015  . Skin lesion of face 02/22/2015  . 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 Anemia 01/15/2015  . CKD (chronic kidney disease) stage 4, GFR 15-29 ml/min (HCC) 01/15/2015  . Hypothyroidism 11/22/2014  . Acute on chronic diastolic congestive heart failure ([place]) 07/04/2014  . COPD GOLD III  02/16/2014  . History of tobacco use 08/23/2013  . 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 CAD (coronary artery disease) 04/06/2013  . PVD - hx of Rt SFA PTA and s/p Rt 4-5th toe amp Dec 2014 04/05/2013  . Essential  hypertension 11/25/2012  . Mixed hyperlipidemia 07/17/2008    Medications: reviewed   Social Hx:  reports that he quit smoking about 3 years ago. His smoking use included Cigarettes. He started smoking about 49 years ago. He has a 46.00 pack-year smoking history. He has never used smokeless tobacco.   Objective:   BP 140/60   Pulse 66   Temp 97.9 F (36.6 C) (Oral)   Ht 5\' 8"  (1.727 m)   Wt 197 lb 3.2 oz (89.4 kg)   SpO2 99%   BMI 29.98 kg/m  Physical Exam  Gen: NAD, alert, cooperative with exam, well-appearing Cardiac: Regular rate and rhythm, normal S1/S2, no murmur, 2+ edema bilateral ankles, capillary refill brisk  Respiratory: Clear to auscultation bilaterally, no wheezes, non-labored breathing Psych: good insight, normal mood and affect  Assessment & Plan:  CHF (congestive heart failure) (HCC) Well appearing today. No signs of CHF exacerbation. Diuresed 9 lbs over the weekend while Torsemide was increased to 40 mg in the morning and 60 mg at night. Weight 1 lb less than when he was discharged from the hospital last month.  - Resume home dose of Torsemide 40 mg BID - Continue other heart failure medications as prescribed - Per last cardiology note, they would like to see him back in April but no appt is scheduled per Epic. Advised patient or his sister to please call  Dr. Claris Gladden office and make sure this is scheduled  Smitty Cords, MD [place], PGY-2

## 2016-04-07 NOTE — Assessment & Plan Note (Signed)
Well appearing today. No signs of CHF exacerbation. Diuresed 9 lbs over the weekend while Torsemide was increased to 40 mg in the morning and 60 mg at night. Weight 1 lb less than when he was discharged from the hospital last month.  - Resume home dose of Torsemide 40 mg BID - Continue other heart failure medications as prescribed - Per last cardiology note, they would like to see him back in April but no appt is scheduled per Epic. Advised patient or his sister to please call Dr. Claris Gladden office and make sure this is scheduled

## 2016-04-07 NOTE — Patient Instructions (Addendum)
Thank you for coming in today, it was so nice to see you! Today we talked about:    Weight: Your weight looks good. You lost 9 lbs of water weight  Please follow up in 1 month if you're feeling fine or sooner if needed. You can schedule this appointment at the front desk before you leave or call the clinic.  Bring in all your medications or supplements to each appointment for review.   If you have any questions or concerns, please do not hesitate to call the office at [phone]. You can also message me directly via MyChart.   Sincerely,  Smitty Cords, MD

## 2016-04-08 ENCOUNTER — Other Ambulatory Visit (HOSPITAL_COMMUNITY): Payer: Self-pay

## 2016-04-08 NOTE — Progress Notes (Signed)
Paramedicine Encounter    Patient ID: Leonard [name], male    DOB: 1950/10/07, 66 y.o.   MRN: [id number]   Patient Care Team: [name], MD as PCP - General (Family Medicine) [name], MD as Consulting Physician (Nephrology)  Patient Active Problem List   Diagnosis Date Noted  . Pleural effusion   . Oxygen desaturation   . Morbid obesity due to excess calories ([place]) 03/17/2016  . Anemia due to stage 4 chronic kidney disease treated with darbepoetin ([place]) 02/28/2016  . Fatigue 01/21/2016  . Diabetic retinopathy ([place]) 11/13/2015  . Dyspnea 08/28/2015  . CHF (congestive heart failure) ([place]) 08/27/2015  . Iron deficiency anemia 08/03/2015  . Claudication ([place])   . Type 2 diabetes mellitus with stage 3 chronic kidney disease, with long-term current use of insulin ([place])   . Secondary hypertension   . Hematuria 06/27/2015  . Skin lesion of face 02/22/2015  . 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 Anemia 01/15/2015  . CKD (chronic kidney disease) stage 4, GFR 15-29 ml/min (HCC) 01/15/2015  . Hypothyroidism 11/22/2014  . Acute on chronic diastolic congestive heart failure ([place]) 07/04/2014  . COPD GOLD III  02/16/2014  . History of tobacco use 08/23/2013  . 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 CAD (coronary artery disease) 04/06/2013  . PVD - hx of Rt SFA PTA and s/p Rt 4-5th toe amp Dec 2014 04/05/2013  . Essential hypertension 11/25/2012  . Mixed hyperlipidemia 07/17/2008    Current Outpatient Prescriptions:  .  amLODipine (NORVASC) 10 MG tablet, Take 1 tablet (10 mg total) by mouth daily., Disp: 30 tablet, Rfl: 6 .  aspirin 81 MG tablet, TAKE 81 MG BY MOUTH EVERY DAY, Disp: 30 tablet, Rfl: 3 .  atorvastatin (LIPITOR) 40 MG tablet, TAKE 1 TABLET BY MOUTH EVERY DAY AT 6 PM (Patient taking differently: Take 40 mg by mouth daily. ), Disp: 90 tablet, Rfl: 3 .  carvedilol (COREG) 25 MG tablet, Take 2 tablets (50 mg total) by mouth 2 (two) times  daily with a meal., Disp: 120 tablet, Rfl: 3 .  doxazosin (CARDURA) 8 MG tablet, Take 1 tablet (8 mg total) by mouth 2 (two) times daily., Disp: 60 tablet, Rfl: 6 .  ferrous sulfate (FERROUSUL) 325 (65 FE) MG tablet, Take 1 tablet (325 mg total) by mouth 2 (two) times daily with a meal., Disp: 60 tablet, Rfl: 3 .  hydrALAZINE (APRESOLINE) 100 MG tablet, TAKE ONE TABLET BY MOUTH EVERY EIGHT HOURS (Patient taking differently: Take 100 mg by mouth every 8 (eight) hours. ), Disp: 90 tablet, Rfl: 3 .  insulin aspart (NOVOLOG FLEXPEN) 100 UNIT/ML FlexPen, Inject 12 Units into the skin 3 (three) times daily with meals., Disp: , Rfl:  .  Insulin Glargine (LANTUS SOLOSTAR) 100 UNIT/ML Solostar Pen, Inject 40 Units into the skin at bedtime., Disp: , Rfl:  .  isosorbide mononitrate (IMDUR) 60 MG 24 hr tablet, Take 60 mg by mouth daily., Disp: , Rfl:  .  levothyroxine (SYNTHROID, LEVOTHROID) 50 MCG tablet, TAKE ONE (1) TABLET BY MOUTH EVERY DAY BEFORE BREAKFAST, Disp: 30 tablet, Rfl: 3 .  nitroGLYCERIN (NITROSTAT) 0.4 MG SL tablet, Place 1 tablet (0.4 mg total) under the tongue every 5 (five) minutes as needed for chest pain., Disp: 30 tablet, Rfl: 12 .  pantoprazole (PROTONIX) 40 MG tablet, TAKE ONE (1) TABLET BY MOUTH EVERY DAY, Disp: 30 tablet, Rfl: 1 .  potassium chloride SA (K-DUR,KLOR-CON) 20 MEQ tablet, Take 67meq (  1 tablet ) in the AM and 33meq (1/2 tablet) in the PM (Patient taking differently: Take 10-20 mEq by mouth See admin instructions. Take 36meq (1 tablet ) in the AM and 57meq (1/2 tablet) in the PM), Disp: 135 tablet, Rfl: 3 .  torsemide (DEMADEX) 20 MG tablet, Take 2 tablets (40 mg total) by mouth 2 (two) times daily. May take an extra 20 mg Daily for weight gain above 212 lbs, Disp: 130 tablet, Rfl: 3 .  cloNIDine (CATAPRES) 0.2 MG tablet, TAKE ONE (1) TABLET BY MOUTH TWO (2) TIMES DAILY (Patient not taking: Reported on 04/04/2016), Disp: 30 tablet, Rfl: 1 .  predniSONE (DELTASONE) 50 MG tablet,  Take 1 tablet (50 mg total) by mouth daily with breakfast. (Patient not taking: Reported on 04/04/2016), Disp: 5 tablet, Rfl: 0 .  Tiotropium Bromide Monohydrate (SPIRIVA RESPIMAT) 2.5 MCG/ACT AERS, Inhale 2 puffs into the lungs daily. (Patient not taking: Reported on 04/04/2016), Disp: 1 Inhaler, Rfl: 0 Allergies  Allergen Reactions  . No Known Allergies      Social History   Social History  . Marital status: Single    Spouse name: N/A  . Number of children: N/A  . Years of education: N/A   Occupational History  . Not on file.   Social History Main Topics  . Smoking status: Former Smoker    Packs/day: 1.00    Years: 46.00    Types: Cigarettes    Start date: 02/04/1967    Quit date: 02/03/2013  . Smokeless tobacco: Never Used     Comment: Doing well with quitting.  . Alcohol use No  . Drug use: No  . Sexual activity: Not Currently   Other Topics Concern  . Not on file   Social History Narrative   Lives with sister, [name] in [place].  Retired - former Sports coach.    Physical Exam  Constitutional: He is oriented to person, place, and time. He appears well-developed.  Neck: Normal range of motion.  Cardiovascular: Normal rate.   Pulmonary/Chest: Effort normal and breath sounds normal.  Abdominal: Soft.  Musculoskeletal: Normal range of motion. He exhibits edema.  +3 pitting to legs   Neurological: He is oriented to person, place, and time.  Skin: Skin is warm and dry.  Psychiatric: He has a normal mood and affect.        SAFE - 03/18/16 1200      Situation   Admitting diagnosis chf   Heart failure history Exisiting   Comorbidities Anemia;Atrial fibillation;CKD/renal insufficiency;COPD;DM;HTN   Readmitted within 30 days No   Hospital admission within past 12 months Yes   number of hospital admissions 4   number of ED visits 4   Target Weight 212 lbs     Assessment   Lives alone Yes   Primary support person sister: betty Hetzer   Mode of  transportation other   Other services involved Other  Home aide (bathe, clean)   [place];Wheelchair     [employer]   Weighs self daily Yes   Scale provided Yes   Records on weight chart No     Resources   Has "Living better w/heart failure" book Yes   Has HF Zone tool Yes   Able to identify yellow zone signs/when to call MD Yes   Records zone daily Yes     Medications   Uses a pill box Yes   Who stocks the pill box --  [facility]   Pill box checked  this visit Yes   Pill box refilled this visit Yes   Difficulty obtaining medications No   Mail order medications Yes   New medications at home today No   Next scheduled delivery of medications --  no refills needed   Missed one or more doses of medications per week Yes   How many missed doses this week 6     Nutrition   Patient receives meals on wheels No   Patient follows low sodium diet No   Has foods at home that meet the current recommended diet No   Patient follows low sugar/card diet No   Nutritional concerns/issues --  pt refuses to eat low sodium diet     Activity Level   ADL's/Mobility Independent   How many feet can patient ambulate --  200   Typical activity level --  normal   Barriers --  none   Actvity tolerance: NYHA Class 3     Urine   Difficulty urinating No   Changes in urine None     Time spent with patient   Time spent with patient  50 Minutes        Future Appointments Date Time Provider [place]  04/10/2016 2:45 PM [facility] LAB 2 [facility] None  04/10/2016 3:15 PM [facility] INJ NURSE [facility] None  04/11/2016 11:00 AM Conrad [place], MD [facility]  04/18/2016 1:30 PM [name], MD [facility]  04/22/2016 2:30 PM Gardiner Barefoot, DPM [facility]  04/24/2016 2:45 PM [facility] ONLY [facility] None  04/24/2016 3:15 PM [facility] INJ NURSE [facility] None  05/08/2016 2:45 PM [facility] ONLY [facility] None  05/08/2016 3:15 PM [facility] INJ NURSE  [facility] None  05/22/2016 10:30 AM [facility] LAB 4 [facility] None  05/22/2016 11:15 AM Ni Gorsuch, MD [facility] None  05/22/2016 11:45 AM [facility] INJ NURSE [facility] None  06/13/2016 11:00 AM [facility] V[facility] 3 [facility]  06/13/2016 11:45 AM Vinnie Level L Nickel, NP [facility]   BP (!) 160/64   Pulse 66   Resp 18   Wt 195 lb (88.5 kg)   SpO2 98%   BMI 29.65 kg/m  Weight yesterday-195 Last visit weight-198 CBG-216  Pt reports he is feeling ok, he is falling asleep in chair during our visit which is his norm. He stays up late watching tv.  Has not missed any doses of his meds this week. meds verified and pill box refilled.--its reported he does not take clonidine but still on his med list and when I called the pharmacy for a refill of his iron they asked about clonidine and procardia so I will f/u to see if he still needs to take that. He ate a couple of thin sliced luncheon meat sandwiches-cautioned him on the sodium content. States he is trying to keep below his fluid limit daily. He has +3 pitting edema to his legs-more so on the left side. B/p elevated. No dizziness, no sob, no h/a.  He states his breathing is doing "alright".   Marylouise Stacks, EMT-Paramedic  04/08/16     ACTION: Home visit completed Next visit planned for 1 week

## 2016-04-10 ENCOUNTER — Other Ambulatory Visit (HOSPITAL_BASED_OUTPATIENT_CLINIC_OR_DEPARTMENT_OTHER): Payer: Medicaid Other

## 2016-04-10 ENCOUNTER — Ambulatory Visit (HOSPITAL_BASED_OUTPATIENT_CLINIC_OR_DEPARTMENT_OTHER): Payer: Medicaid Other

## 2016-04-10 VITALS — BP 180/64 | HR 77 | Temp 98.0°F | Resp 18

## 2016-04-10 DIAGNOSIS — N184 Chronic kidney disease, stage 4 (severe): Secondary | ICD-10-CM | POA: Diagnosis present

## 2016-04-10 DIAGNOSIS — D631 Anemia in chronic kidney disease: Secondary | ICD-10-CM

## 2016-04-10 LAB — CBC WITH DIFFERENTIAL/PLATELET
BASO%: 0.4 % (ref 0.0–2.0)
Basophils Absolute: 0 10*3/uL (ref 0.0–0.1)
EOS%: 2.5 % (ref 0.0–7.0)
Eosinophils Absolute: 0.1 10*3/uL (ref 0.0–0.5)
HEMATOCRIT: 31.9 % — AB (ref 38.4–49.9)
HEMOGLOBIN: 10 g/dL — AB (ref 13.0–17.1)
LYMPH#: 1.4 10*3/uL (ref 0.9–3.3)
LYMPH%: 24.5 % (ref 14.0–49.0)
MCH: 24 pg — ABNORMAL LOW (ref 27.2–33.4)
MCHC: 31.3 g/dL — AB (ref 32.0–36.0)
MCV: 76.7 fL — ABNORMAL LOW (ref 79.3–98.0)
MONO#: 0.4 10*3/uL (ref 0.1–0.9)
MONO%: 7.4 % (ref 0.0–14.0)
NEUT%: 65.2 % (ref 39.0–75.0)
NEUTROS ABS: 3.6 10*3/uL (ref 1.5–6.5)
Platelets: 158 10*3/uL (ref 140–400)
RBC: 4.16 10*6/uL — ABNORMAL LOW (ref 4.20–5.82)
RDW: 19.2 % — ABNORMAL HIGH (ref 11.0–14.6)
WBC: 5.5 10*3/uL (ref 4.0–10.3)
nRBC: 0 % (ref 0–0)

## 2016-04-10 MED ORDER — DARBEPOETIN ALFA 500 MCG/ML IJ SOSY
500.0000 ug | PREFILLED_SYRINGE | Freq: Once | INTRAMUSCULAR | Status: AC
  Administered 2016-04-10: 500 ug via SUBCUTANEOUS
  Filled 2016-04-10: qty 1

## 2016-04-11 ENCOUNTER — Ambulatory Visit (INDEPENDENT_AMBULATORY_CARE_PROVIDER_SITE_OTHER): Payer: Self-pay | Admitting: Vascular Surgery

## 2016-04-11 ENCOUNTER — Encounter: Payer: Self-pay | Admitting: Vascular Surgery

## 2016-04-11 ENCOUNTER — Telehealth: Payer: Self-pay | Admitting: Internal Medicine

## 2016-04-11 ENCOUNTER — Ambulatory Visit: Payer: Medicaid Other | Admitting: Podiatry

## 2016-04-11 VITALS — BP 161/68 | HR 79 | Temp 98.7°F | Resp 16 | Ht 68.0 in | Wt 202.0 lb

## 2016-04-11 DIAGNOSIS — N184 Chronic kidney disease, stage 4 (severe): Secondary | ICD-10-CM

## 2016-04-11 NOTE — Telephone Encounter (Signed)
LMTCB

## 2016-04-15 ENCOUNTER — Other Ambulatory Visit (HOSPITAL_COMMUNITY): Payer: Self-pay

## 2016-04-15 ENCOUNTER — Telehealth (HOSPITAL_COMMUNITY): Payer: Self-pay | Admitting: *Deleted

## 2016-04-15 NOTE — Telephone Encounter (Signed)
LMTCB

## 2016-04-15 NOTE — Progress Notes (Signed)
Paramedicine Encounter    Patient ID: Leonard [name], male    DOB: [birth year], 66 y.o.   MRN: [id number]   Patient Care Team: [name], MD as PCP - General (Family Medicine) [name], MD as Consulting Physician (Nephrology)  Patient Active Problem List   Diagnosis Date Noted  . Pleural effusion   . Oxygen desaturation   . Morbid obesity due to excess calories ([place]) 03/17/2016  . Anemia due to stage 4 chronic kidney disease treated with darbepoetin ([place]) 02/28/2016  . Fatigue 01/21/2016  . Diabetic retinopathy ([place]) 11/13/2015  . Dyspnea 08/28/2015  . CHF (congestive heart failure) ([place]) 08/27/2015  . Iron deficiency anemia 08/03/2015  . Claudication ([place])   . Type 2 diabetes mellitus with stage 3 chronic kidney disease, with long-term current use of insulin ([place])   . Secondary hypertension   . Hematuria 06/27/2015  . Skin lesion of face 02/22/2015  . 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 Anemia 01/15/2015  . CKD (chronic kidney disease) stage 4, GFR 15-29 ml/min (HCC) 01/15/2015  . Hypothyroidism 11/22/2014  . Acute on chronic diastolic congestive heart failure ([place]) 07/04/2014  . COPD GOLD III  02/16/2014  . History of tobacco use 08/23/2013  . 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 CAD (coronary artery disease) 04/06/2013  . PVD - hx of Rt SFA PTA and s/p Rt 4-5th toe amp Dec 2014 04/05/2013  . Essential hypertension 11/25/2012  . Mixed hyperlipidemia 07/17/2008    Current Outpatient Prescriptions:  .  amLODipine (NORVASC) 10 MG tablet, Take 1 tablet (10 mg total) by mouth daily., Disp: 30 tablet, Rfl: 6 .  aspirin 81 MG tablet, TAKE 81 MG BY MOUTH EVERY DAY, Disp: 30 tablet, Rfl: 3 .  atorvastatin (LIPITOR) 40 MG tablet, TAKE 1 TABLET BY MOUTH EVERY DAY AT 6 PM (Patient taking differently: Take 40 mg by mouth daily. ), Disp: 90 tablet, Rfl: 3 .  carvedilol (COREG) 25 MG tablet, Take 2 tablets (50 mg total) by mouth 2 (two) times  daily with a meal., Disp: 120 tablet, Rfl: 3 .  cloNIDine (CATAPRES) 0.2 MG tablet, TAKE ONE (1) TABLET BY MOUTH TWO (2) TIMES DAILY, Disp: 30 tablet, Rfl: 1 .  doxazosin (CARDURA) 8 MG tablet, Take 1 tablet (8 mg total) by mouth 2 (two) times daily., Disp: 60 tablet, Rfl: 6 .  ferrous sulfate (FERROUSUL) 325 (65 FE) MG tablet, Take 1 tablet (325 mg total) by mouth 2 (two) times daily with a meal., Disp: 60 tablet, Rfl: 3 .  hydrALAZINE (APRESOLINE) 100 MG tablet, TAKE ONE TABLET BY MOUTH EVERY EIGHT HOURS (Patient taking differently: Take 100 mg by mouth every 8 (eight) hours. ), Disp: 90 tablet, Rfl: 3 .  insulin aspart (NOVOLOG FLEXPEN) 100 UNIT/ML FlexPen, Inject 12 Units into the skin 3 (three) times daily with meals., Disp: , Rfl:  .  Insulin Glargine (LANTUS SOLOSTAR) 100 UNIT/ML Solostar Pen, Inject 40 Units into the skin at bedtime., Disp: , Rfl:  .  isosorbide mononitrate (IMDUR) 60 MG 24 hr tablet, Take 60 mg by mouth daily., Disp: , Rfl:  .  levothyroxine (SYNTHROID, LEVOTHROID) 50 MCG tablet, TAKE ONE (1) TABLET BY MOUTH EVERY DAY BEFORE BREAKFAST, Disp: 30 tablet, Rfl: 3 .  pantoprazole (PROTONIX) 40 MG tablet, TAKE ONE (1) TABLET BY MOUTH EVERY DAY, Disp: 30 tablet, Rfl: 1 .  potassium chloride SA (K-DUR,KLOR-CON) 20 MEQ tablet, Take 66meq (1 tablet ) in the AM and 13meq (1/2  tablet) in the PM (Patient taking differently: Take 10-20 mEq by mouth See admin instructions. Take 75meq (1 tablet ) in the AM and 73meq (1/2 tablet) in the PM), Disp: 135 tablet, Rfl: 3 .  torsemide (DEMADEX) 20 MG tablet, Take 2 tablets (40 mg total) by mouth 2 (two) times daily. May take an extra 20 mg Daily for weight gain above 212 lbs, Disp: 130 tablet, Rfl: 3 .  nitroGLYCERIN (NITROSTAT) 0.4 MG SL tablet, Place 1 tablet (0.4 mg total) under the tongue every 5 (five) minutes as needed for chest pain., Disp: 30 tablet, Rfl: 12 .  predniSONE (DELTASONE) 50 MG tablet, Take 1 tablet (50 mg total) by mouth daily  with breakfast., Disp: 5 tablet, Rfl: 0 .  Tiotropium Bromide Monohydrate (SPIRIVA RESPIMAT) 2.5 MCG/ACT AERS, Inhale 2 puffs into the lungs daily., Disp: 1 Inhaler, Rfl: 0 Allergies  Allergen Reactions  . No Known Allergies      Social History   Social History  . Marital status: Single    Spouse name: N/A  . Number of children: N/A  . Years of education: N/A   Occupational History  . Not on file.   Social History Main Topics  . Smoking status: Former Smoker    Packs/day: 1.00    Years: 46.00    Types: Cigarettes    Start date: 02/04/1967    Quit date: 02/03/2013  . Smokeless tobacco: Never Used     Comment: Doing well with quitting.  . Alcohol use No  . Drug use: No  . Sexual activity: Not Currently   Other Topics Concern  . Not on file   Social History Narrative   Lives with sister, Leonard [name] in [place].  Retired - former Sports coach.    Physical Exam  Pulmonary/Chest: No respiratory distress. He has no wheezes. He has no rales.  Abdominal: He exhibits no distension. There is no tenderness. There is no guarding.  Musculoskeletal: He exhibits edema.  Skin: Skin is warm and dry. He is not diaphoretic.        SAFE - 03/18/16 1200      Situation   Admitting diagnosis chf   Heart failure history Exisiting   Comorbidities Anemia;Atrial fibillation;CKD/renal insufficiency;COPD;DM;HTN   Readmitted within 30 days No   Hospital admission within past 12 months Yes   number of hospital admissions 4   number of ED visits 4   Target Weight 212 lbs     Assessment   Lives alone Yes   Primary support person sister: Leonard [name]   Mode of transportation other   Other services involved Other  Home aide (bathe, clean)   [place];Wheelchair     [employer]   Weighs self daily Yes   Scale provided Yes   Records on weight chart No     Resources   Has "Living better w/heart failure" book Yes   Has HF Zone tool Yes   Able to identify yellow zone signs/when  to call MD Yes   Records zone daily Yes     Medications   Uses a pill box Yes   Who stocks the pill box --  [facility]   Pill box checked this visit Yes   Pill box refilled this visit Yes   Difficulty obtaining medications No   Mail order medications Yes   New medications at home today No   Next scheduled delivery of medications --  no refills needed   Missed one or more doses of  medications per week Yes   How many missed doses this week 6     Nutrition   Patient receives meals on wheels No   Patient follows low sodium diet No   Has foods at home that meet the current recommended diet No   Patient follows low sugar/card diet No   Nutritional concerns/issues --  pt refuses to eat low sodium diet     Activity Level   ADL's/Mobility Independent   How many feet can patient ambulate --  200   Typical activity level --  normal   Barriers --  none   Actvity tolerance: NYHA Class 3     Urine   Difficulty urinating No   Changes in urine None     Time spent with patient   Time spent with patient  50 Minutes        Future Appointments Date Time Provider [place]  04/18/2016 1:30 PM [name], MD [facility]  04/22/2016 2:30 PM Gardiner Barefoot, DPM [facility]  04/24/2016 2:45 PM [facility] ONLY [facility] None  04/24/2016 3:15 PM [facility] INJ NURSE [facility] None  04/28/2016 7:45 AM Hayden Pedro, MD [facility] None  05/08/2016 2:45 PM [facility] None  05/08/2016 3:15 PM [facility] INJ NURSE [facility] None  05/22/2016 10:30 AM [facility] LAB 4 [facility] None  05/22/2016 11:15 AM Ni Gorsuch, MD [facility] None  05/22/2016 11:45 AM [facility] INJ NURSE [facility] None  06/13/2016 11:00 AM [facility] VASC 3 [facility]  06/13/2016 11:45 AM Sharmon Leyden Nickel, NP [facility]   ATF pt CAO x4 with no complaints today.  Pt was eating a ham and tomato sandwich upon my arrival.  Pt has been advise against eating ham, bologna, and processed  meat several times.  Pt missed 1 dose of medications since last week.  Pt denies sob, dizziness, headache and chest pain. Pt still has +3 edema in both legs.  rx bottles and pill box verified. Heart clinic called and made aware of pt's weight gain.   No ferrous sulfate in sat-tues **Refills called in: ferrous sulfate hydralazine cbg 242  BP (!) 158/62   Pulse 67   Resp 16   Wt 203 lb (92.1 kg)   SpO2 98%   BMI 30.87 kg/m   Weight yesterday-207 Last visit weight-195    ACTION: Home visit completed Next visit planned for next tuesday

## 2016-04-15 NOTE — Telephone Encounter (Signed)
Dee with Paramed called to report that patient has had 8 lb weight gain in 1 week.  Patient did miss one evening dose of medications but otherwise has been taking them as prescribed.  His diet has been poor and high in sodium.  A lot of deli meet for several meals.  Patient is not have any SOB, swelling, or cough and has clear lung sounds.  Karena Addison will continue to follow patient.

## 2016-04-15 NOTE — Telephone Encounter (Signed)
[name] with [employer] is returning phone call.

## 2016-04-15 NOTE — Telephone Encounter (Signed)
Spoke with [name] at [facility] for Care. Advised her per MW's last OV, pt could stop taking Spiriva Respimat. Nothing further was needed.

## 2016-04-18 ENCOUNTER — Ambulatory Visit: Payer: Medicaid Other | Admitting: Family Medicine

## 2016-04-22 ENCOUNTER — Other Ambulatory Visit (HOSPITAL_COMMUNITY): Payer: Self-pay

## 2016-04-22 ENCOUNTER — Telehealth (HOSPITAL_COMMUNITY): Payer: Self-pay | Admitting: *Deleted

## 2016-04-22 ENCOUNTER — Encounter: Payer: Self-pay | Admitting: Podiatry

## 2016-04-22 ENCOUNTER — Other Ambulatory Visit: Payer: Self-pay | Admitting: *Deleted

## 2016-04-22 ENCOUNTER — Ambulatory Visit (INDEPENDENT_AMBULATORY_CARE_PROVIDER_SITE_OTHER): Payer: Medicaid Other | Admitting: Podiatry

## 2016-04-22 DIAGNOSIS — M79676 Pain in unspecified toe(s): Secondary | ICD-10-CM | POA: Diagnosis not present

## 2016-04-22 DIAGNOSIS — E1140 Type 2 diabetes mellitus with diabetic neuropathy, unspecified: Secondary | ICD-10-CM

## 2016-04-22 DIAGNOSIS — B351 Tinea unguium: Secondary | ICD-10-CM | POA: Diagnosis not present

## 2016-04-22 NOTE — Progress Notes (Signed)
Paramedicine Encounter    Patient ID: Leonard [name], male    DOB: 01/31/1951, 66 y.o.   MRN: [id number]   Patient Care Team: [name], MD as PCP - General (Family Medicine) [name], MD as Consulting Physician (Nephrology)  Patient Active Problem List   Diagnosis Date Noted  . Pleural effusion   . Oxygen desaturation   . Morbid obesity due to excess calories ([place]) 03/17/2016  . Anemia due to stage 4 chronic kidney disease treated with darbepoetin ([place]) 02/28/2016  . Fatigue 01/21/2016  . Diabetic retinopathy ([place]) 11/13/2015  . Dyspnea 08/28/2015  . CHF (congestive heart failure) ([place]) 08/27/2015  . Iron deficiency anemia 08/03/2015  . Claudication ([place])   . Type 2 diabetes mellitus with stage 3 chronic kidney disease, with long-term current use of insulin ([place])   . Secondary hypertension   . Hematuria 06/27/2015  . Skin lesion of face 02/22/2015  . 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 Anemia 01/15/2015  . CKD (chronic kidney disease) stage 4, GFR 15-29 ml/min (HCC) 01/15/2015  . Hypothyroidism 11/22/2014  . Acute on chronic diastolic congestive heart failure ([place]) 07/04/2014  . COPD GOLD III  02/16/2014  . History of tobacco use 08/23/2013  . 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 CAD (coronary artery disease) 04/06/2013  . PVD - hx of Rt SFA PTA and s/p Rt 4-5th toe amp Dec 2014 04/05/2013  . Essential hypertension 11/25/2012  . Mixed hyperlipidemia 07/17/2008    Current Outpatient Prescriptions:  .  amLODipine (NORVASC) 10 MG tablet, Take 1 tablet (10 mg total) by mouth daily., Disp: 30 tablet, Rfl: 6 .  aspirin 81 MG tablet, TAKE 81 MG BY MOUTH EVERY DAY, Disp: 30 tablet, Rfl: 3 .  atorvastatin (LIPITOR) 40 MG tablet, TAKE 1 TABLET BY MOUTH EVERY DAY AT 6 PM (Patient taking differently: Take 40 mg by mouth daily. ), Disp: 90 tablet, Rfl: 3 .  carvedilol (COREG) 25 MG tablet, Take 2 tablets (50 mg total) by mouth 2 (two) times  daily with a meal., Disp: 120 tablet, Rfl: 3 .  cloNIDine (CATAPRES) 0.2 MG tablet, TAKE ONE (1) TABLET BY MOUTH TWO (2) TIMES DAILY, Disp: 30 tablet, Rfl: 1 .  doxazosin (CARDURA) 8 MG tablet, Take 1 tablet (8 mg total) by mouth 2 (two) times daily., Disp: 60 tablet, Rfl: 6 .  ferrous sulfate (FERROUSUL) 325 (65 FE) MG tablet, Take 1 tablet (325 mg total) by mouth 2 (two) times daily with a meal., Disp: 60 tablet, Rfl: 3 .  hydrALAZINE (APRESOLINE) 100 MG tablet, TAKE ONE TABLET BY MOUTH EVERY EIGHT HOURS (Patient taking differently: Take 100 mg by mouth every 8 (eight) hours. ), Disp: 90 tablet, Rfl: 3 .  insulin aspart (NOVOLOG FLEXPEN) 100 UNIT/ML FlexPen, Inject 12 Units into the skin 3 (three) times daily with meals., Disp: , Rfl:  .  Insulin Glargine (LANTUS SOLOSTAR) 100 UNIT/ML Solostar Pen, Inject 40 Units into the skin at bedtime., Disp: , Rfl:  .  isosorbide mononitrate (IMDUR) 60 MG 24 hr tablet, Take 60 mg by mouth daily., Disp: , Rfl:  .  levothyroxine (SYNTHROID, LEVOTHROID) 50 MCG tablet, TAKE ONE (1) TABLET BY MOUTH EVERY DAY BEFORE BREAKFAST, Disp: 30 tablet, Rfl: 3 .  nitroGLYCERIN (NITROSTAT) 0.4 MG SL tablet, Place 1 tablet (0.4 mg total) under the tongue every 5 (five) minutes as needed for chest pain., Disp: 30 tablet, Rfl: 12 .  pantoprazole (PROTONIX) 40 MG tablet, TAKE ONE (  1) TABLET BY MOUTH EVERY DAY, Disp: 30 tablet, Rfl: 1 .  potassium chloride SA (K-DUR,KLOR-CON) 20 MEQ tablet, Take 34meq (1 tablet ) in the AM and 75meq (1/2 tablet) in the PM (Patient taking differently: Take 10-20 mEq by mouth See admin instructions. Take 39meq (1 tablet ) in the AM and 67meq (1/2 tablet) in the PM), Disp: 135 tablet, Rfl: 3 .  predniSONE (DELTASONE) 50 MG tablet, Take 1 tablet (50 mg total) by mouth daily with breakfast., Disp: 5 tablet, Rfl: 0 .  Tiotropium Bromide Monohydrate (SPIRIVA RESPIMAT) 2.5 MCG/ACT AERS, Inhale 2 puffs into the lungs daily., Disp: 1 Inhaler, Rfl: 0 .   torsemide (DEMADEX) 20 MG tablet, Take 2 tablets (40 mg total) by mouth 2 (two) times daily. May take an extra 20 mg Daily for weight gain above 212 lbs, Disp: 130 tablet, Rfl: 3 Allergies  Allergen Reactions  . No Known Allergies      Social History   Social History  . Marital status: Single    Spouse name: N/A  . Number of children: N/A  . Years of education: N/A   Occupational History  . Not on file.   Social History Main Topics  . Smoking status: Former Smoker    Packs/day: 1.00    Years: 46.00    Types: Cigarettes    Start date: 02/04/1967    Quit date: 02/03/2013  . Smokeless tobacco: Never Used     Comment: Doing well with quitting.  . Alcohol use No  . Drug use: No  . Sexual activity: Not Currently   Other Topics Concern  . Not on file   Social History Narrative   Lives with sister, Leonard [name] in [place].  Retired - former Sports coach.    Physical Exam  Eyes: Pupils are equal, round, and reactive to light.  Pulmonary/Chest: No respiratory distress. He has no wheezes. He has no rales. He exhibits no tenderness.  Abdominal: Soft. There is no tenderness. There is no guarding.  Musculoskeletal: He exhibits edema.  Skin: Skin is warm and dry. He is not diaphoretic.        SAFE - 03/18/16 1200      Situation   Admitting diagnosis chf   Heart failure history Exisiting   Comorbidities Anemia;Atrial fibillation;CKD/renal insufficiency;COPD;DM;HTN   Readmitted within 30 days No   Hospital admission within past 12 months Yes   number of hospital admissions 4   number of ED visits 4   Target Weight 212 lbs     Assessment   Lives alone Yes   Primary support person sister: Leonard [name]   Mode of transportation other   Other services involved Other  Home aide (bathe, clean)   [place];Wheelchair     [employer]   Weighs self daily Yes   Scale provided Yes   Records on weight chart No     Resources   Has "Living better w/heart failure" book Yes    Has HF Zone tool Yes   Able to identify yellow zone signs/when to call MD Yes   Records zone daily Yes     Medications   Uses a pill box Yes   Who stocks the pill box --  [facility]   Pill box checked this visit Yes   Pill box refilled this visit Yes   Difficulty obtaining medications No   Mail order medications Yes   New medications at home today No   Next scheduled delivery of medications --  no refills needed   Missed one or more doses of medications per week Yes   How many missed doses this week 6     Nutrition   Patient receives meals on wheels No   Patient follows low sodium diet No   Has foods at home that meet the current recommended diet No   Patient follows low sugar/card diet No   Nutritional concerns/issues --  pt refuses to eat low sodium diet     Activity Level   ADL's/Mobility Independent   How many feet can patient ambulate --  200   Typical activity level --  normal   Barriers --  none   Actvity tolerance: NYHA Class 3     Urine   Difficulty urinating No   Changes in urine None     Time spent with patient   Time spent with patient  50 Minutes        Future Appointments Date Time Provider [place] [facility] [place]  04/22/2016 2:30 PM Gardiner Barefoot, DPM [facility]  04/24/2016 2:45 PM [facility] None  04/24/2016 3:15 PM [facility] INJ NURSE [facility] None  04/28/2016 7:45 AM Hayden Pedro, MD [facility] None  04/28/2016 10:30 AM Leatrice Jewels Windell Moulding, DO [facility] [place]  05/08/2016 2:45 PM [facility] None  05/08/2016 3:15 PM [facility] INJ NURSE [facility] None  05/22/2016 10:30 AM [facility] LAB 4 [facility] None  05/22/2016 11:15 AM Ni Gorsuch, MD [facility] None  05/22/2016 11:45 AM [facility] INJ NURSE [facility] None  06/13/2016 11:00 AM [facility] VASC 3 [facility]  06/13/2016 11:45 AM Sharmon Leyden Nickel, NP [facility]   ATF pt CAO x4 sitting on the porch with no complaints.  Pt had noticible increased edema in  his legs and feet since our last visit.  Pt stated that he ate greens over the weekend and yesterday. Pt also had ribs with his meal yesterday.  Pt weight was elevated last visit but it (epic) doesn't show that he was given any instructions to take extra medications.  Pt denies sob, headache, dizziness and chest pain.  Pt had +3 edema in both feet and legs last visit which the edema is now to his upper calf.  Pt has only missed one medication dose on Thursday evening.  rx bottles and pill box verified.  Pt has another weight increase since last visit therefore I called heart clinic to make them aware of the same.    Leonard [name] was on scene with the vest, vest reading @ 40%.  Leonard [name] made aware of the same.    Leonard [name] advised that pt take torsemide 80 mg twice a day for 2 days.  She also scheduled an appointment for f/u @clinic .  Pt's pill box revised accordingly.    Called in: Isosorbide called in to doc due to refills being out per pharmacist Doxazosin Amlodipine (all medications were included in pill box)  BP (!) 164/64 (BP Location: Right Arm, Patient Position: Sitting, Cuff Size: Normal)   Pulse 67   Resp 16   Wt 210 lb (95.3 kg)   SpO2 99%   BMI 31.93 kg/m   cbg 198  Weight yesterday-210 Last visit weight-203    ACTION: Home visit completed

## 2016-04-22 NOTE — Telephone Encounter (Signed)
Dee called to report pt's wt was up to 210 lb today, last week on 3/13 he was 203 and the week before on 3/6 he was at 195 (14 lb gain in 2 weeks).  She states his toes and LE are swollen up to his mid calves, denies SOB.  BP 164/64 HR 67, O2 99% on RA.  She states pt has been taking Torsemide 40 mg BID and has not missed any doses.  They did a ReDS vest on pt and got a reading of 40.  Discussed all info w/Amy Ninfa Meeker, NP she recommends pt increase Torsemide to 80 mg BID for 2 days only then resume the 40 mg BID, sch a f/u appt next week.  Karena Addison is aware and will make med changes for pt, appt sch for 3/27.

## 2016-04-22 NOTE — Progress Notes (Signed)
Patient ID: Leonard [name], male   DOB: 1950-04-01, 66 y.o.   MRN: [id number] Complaint:  Visit Type: Patient returns to my office for continued preventative foot care services. Complaint: Patient states" my nails have grown long and thick and become painful to walk and wear shoes" Patient has been diagnosed with DM with amputation 4,5 rays right foot... The patient presents for preventative foot care services. No changes to ROS  Podiatric Exam: Vascular: dorsalis pedis and posterior tibial pulses are not  palpable bilateral. Capillary return is immediate.  Cold feet Absence of hair.  Sensorium: Absent  Semmes Weinstein monofilament test. Normal tactile sensation bilaterally. Nail Exam: Pt has thick disfigured discolored nails with subungual debris noted bilateral entire nail hallux through fifth toenails Ulcer Exam: There is no evidence of ulcer or pre-ulcerative changes or infection. Orthopedic Exam: Muscle tone and strength are WNL. No limitations in general ROM. No crepitus or effusions noted. Foot type and digits show no abnormalities. Bony prominences are unremarkable.Amputation 4,5 digits right foot. Skin: Asymptomatic  Porokeratosis left foot.. No infection or ulcers  Diagnosis:  Onychomycosis, , Pain in right toe, pain in left toes  Treatment & Plan Procedures and Treatment: Consent by patient was obtained for treatment procedures. The patient understood the discussion of treatment and procedures well. All questions were answered thoroughly reviewed. Debridement of mycotic and hypertrophic toenails, 1 through 5 bilateral and clearing of subungual debris. No ulceration, no infection noted. Requests no drill usage.   Return Visit-Office Procedure: Patient instructed to return to the office for a follow up visit 3 months for continued evaluation and treatment.    Gardiner Barefoot DPM

## 2016-04-24 ENCOUNTER — Ambulatory Visit (HOSPITAL_BASED_OUTPATIENT_CLINIC_OR_DEPARTMENT_OTHER): Payer: Medicaid Other

## 2016-04-24 ENCOUNTER — Other Ambulatory Visit (HOSPITAL_BASED_OUTPATIENT_CLINIC_OR_DEPARTMENT_OTHER): Payer: Medicaid Other

## 2016-04-24 VITALS — BP 127/55 | HR 62 | Temp 97.7°F | Resp 16

## 2016-04-24 DIAGNOSIS — D631 Anemia in chronic kidney disease: Secondary | ICD-10-CM | POA: Diagnosis not present

## 2016-04-24 DIAGNOSIS — N189 Chronic kidney disease, unspecified: Principal | ICD-10-CM

## 2016-04-24 DIAGNOSIS — N184 Chronic kidney disease, stage 4 (severe): Secondary | ICD-10-CM

## 2016-04-24 LAB — CBC WITH DIFFERENTIAL/PLATELET
BASO%: 0.6 % (ref 0.0–2.0)
BASOS ABS: 0 10*3/uL (ref 0.0–0.1)
EOS ABS: 0.2 10*3/uL (ref 0.0–0.5)
EOS%: 2.7 % (ref 0.0–7.0)
HEMATOCRIT: 30.2 % — AB (ref 38.4–49.9)
HEMOGLOBIN: 9.5 g/dL — AB (ref 13.0–17.1)
LYMPH%: 19 % (ref 14.0–49.0)
MCH: 24.5 pg — AB (ref 27.2–33.4)
MCHC: 31.4 g/dL — ABNORMAL LOW (ref 32.0–36.0)
MCV: 78.1 fL — ABNORMAL LOW (ref 79.3–98.0)
MONO#: 0.6 10*3/uL (ref 0.1–0.9)
MONO%: 9 % (ref 0.0–14.0)
NEUT#: 4.2 10*3/uL (ref 1.5–6.5)
NEUT%: 68.7 % (ref 39.0–75.0)
PLATELETS: 152 10*3/uL (ref 140–400)
RBC: 3.87 10*6/uL — ABNORMAL LOW (ref 4.20–5.82)
RDW: 22.1 % — AB (ref 11.0–14.6)
WBC: 6.1 10*3/uL (ref 4.0–10.3)
lymph#: 1.2 10*3/uL (ref 0.9–3.3)

## 2016-04-24 MED ORDER — DARBEPOETIN ALFA 500 MCG/ML IJ SOSY
500.0000 ug | PREFILLED_SYRINGE | Freq: Once | INTRAMUSCULAR | Status: AC
  Administered 2016-04-24: 500 ug via SUBCUTANEOUS
  Filled 2016-04-24: qty 1

## 2016-04-28 ENCOUNTER — Encounter (INDEPENDENT_AMBULATORY_CARE_PROVIDER_SITE_OTHER): Payer: Medicaid Other | Admitting: Ophthalmology

## 2016-04-28 ENCOUNTER — Ambulatory Visit: Payer: Medicaid Other | Admitting: Family Medicine

## 2016-04-28 ENCOUNTER — Other Ambulatory Visit: Payer: Self-pay | Admitting: Family Medicine

## 2016-04-28 DIAGNOSIS — H43813 Vitreous degeneration, bilateral: Secondary | ICD-10-CM

## 2016-04-28 DIAGNOSIS — I10 Essential (primary) hypertension: Secondary | ICD-10-CM

## 2016-04-28 DIAGNOSIS — H35033 Hypertensive retinopathy, bilateral: Secondary | ICD-10-CM

## 2016-04-28 DIAGNOSIS — E11311 Type 2 diabetes mellitus with unspecified diabetic retinopathy with macular edema: Secondary | ICD-10-CM

## 2016-04-28 DIAGNOSIS — E113392 Type 2 diabetes mellitus with moderate nonproliferative diabetic retinopathy without macular edema, left eye: Secondary | ICD-10-CM | POA: Diagnosis not present

## 2016-04-28 DIAGNOSIS — E113311 Type 2 diabetes mellitus with moderate nonproliferative diabetic retinopathy with macular edema, right eye: Secondary | ICD-10-CM

## 2016-04-29 ENCOUNTER — Other Ambulatory Visit (HOSPITAL_COMMUNITY): Payer: Self-pay

## 2016-04-29 ENCOUNTER — Ambulatory Visit (HOSPITAL_COMMUNITY)
Admission: RE | Admit: 2016-04-29 | Discharge: 2016-04-29 | Disposition: A | Discharge status: Home / Self Care | Payer: Medicaid Other | Source: Ambulatory Visit | Attending: Internal Medicine | Admitting: Internal Medicine

## 2016-04-29 ENCOUNTER — Telehealth: Payer: Self-pay | Admitting: Family Medicine

## 2016-04-29 VITALS — BP 148/62 | HR 65 | Wt 215.8 lb

## 2016-04-29 DIAGNOSIS — I5033 Acute on chronic diastolic (congestive) heart failure: Secondary | ICD-10-CM

## 2016-04-29 DIAGNOSIS — Z801 Family history of malignant neoplasm of trachea, bronchus and lung: Secondary | ICD-10-CM | POA: Diagnosis not present

## 2016-04-29 DIAGNOSIS — D631 Anemia in chronic kidney disease: Secondary | ICD-10-CM | POA: Insufficient documentation

## 2016-04-29 DIAGNOSIS — Z87891 Personal history of nicotine dependence: Secondary | ICD-10-CM | POA: Diagnosis not present

## 2016-04-29 DIAGNOSIS — N185 Chronic kidney disease, stage 5: Secondary | ICD-10-CM | POA: Diagnosis not present

## 2016-04-29 DIAGNOSIS — Z79899 Other long term (current) drug therapy: Secondary | ICD-10-CM | POA: Insufficient documentation

## 2016-04-29 DIAGNOSIS — E785 Hyperlipidemia, unspecified: Secondary | ICD-10-CM | POA: Diagnosis not present

## 2016-04-29 DIAGNOSIS — E1122 Type 2 diabetes mellitus with diabetic chronic kidney disease: Secondary | ICD-10-CM | POA: Diagnosis not present

## 2016-04-29 DIAGNOSIS — G4733 Obstructive sleep apnea (adult) (pediatric): Secondary | ICD-10-CM | POA: Diagnosis not present

## 2016-04-29 DIAGNOSIS — Z8249 Family history of ischemic heart disease and other diseases of the circulatory system: Secondary | ICD-10-CM | POA: Insufficient documentation

## 2016-04-29 DIAGNOSIS — Z9981 Dependence on supplemental oxygen: Secondary | ICD-10-CM | POA: Diagnosis not present

## 2016-04-29 DIAGNOSIS — I5032 Chronic diastolic (congestive) heart failure: Secondary | ICD-10-CM | POA: Diagnosis not present

## 2016-04-29 DIAGNOSIS — I2510 Atherosclerotic heart disease of native coronary artery without angina pectoris: Secondary | ICD-10-CM | POA: Insufficient documentation

## 2016-04-29 DIAGNOSIS — D472 Monoclonal gammopathy: Secondary | ICD-10-CM | POA: Insufficient documentation

## 2016-04-29 DIAGNOSIS — I739 Peripheral vascular disease, unspecified: Secondary | ICD-10-CM | POA: Diagnosis not present

## 2016-04-29 DIAGNOSIS — Z794 Long term (current) use of insulin: Secondary | ICD-10-CM | POA: Diagnosis not present

## 2016-04-29 DIAGNOSIS — Z7982 Long term (current) use of aspirin: Secondary | ICD-10-CM | POA: Diagnosis not present

## 2016-04-29 DIAGNOSIS — Z951 Presence of aortocoronary bypass graft: Secondary | ICD-10-CM | POA: Insufficient documentation

## 2016-04-29 DIAGNOSIS — I132 Hypertensive heart and chronic kidney disease with heart failure and with stage 5 chronic kidney disease, or end stage renal disease: Secondary | ICD-10-CM | POA: Diagnosis not present

## 2016-04-29 DIAGNOSIS — J449 Chronic obstructive pulmonary disease, unspecified: Secondary | ICD-10-CM

## 2016-04-29 DIAGNOSIS — E039 Hypothyroidism, unspecified: Secondary | ICD-10-CM | POA: Diagnosis not present

## 2016-04-29 DIAGNOSIS — I10 Essential (primary) hypertension: Secondary | ICD-10-CM | POA: Diagnosis not present

## 2016-04-29 LAB — BASIC METABOLIC PANEL
ANION GAP: 10 (ref 5–15)
BUN: 55 mg/dL — ABNORMAL HIGH (ref 6–20)
CHLORIDE: 105 mmol/L (ref 101–111)
CO2: 24 mmol/L (ref 22–32)
Calcium: 8.9 mg/dL (ref 8.9–10.3)
Creatinine, Ser: 2.9 mg/dL — ABNORMAL HIGH (ref 0.61–1.24)
GFR calc non Af Amer: 21 mL/min — ABNORMAL LOW (ref >60)
GFR, EST AFRICAN AMERICAN: 25 mL/min — AB (ref >60)
Glucose, Bld: 267 mg/dL — ABNORMAL HIGH (ref 65–99)
Potassium: 4.3 mmol/L (ref 3.5–5.1)
Sodium: 139 mmol/L (ref 135–145)

## 2016-04-29 LAB — BRAIN NATRIURETIC PEPTIDE: B NATRIURETIC PEPTIDE 5: 202.1 pg/mL — AB (ref 0.0–100.0)

## 2016-04-29 MED ORDER — POTASSIUM CHLORIDE CRYS ER 20 MEQ PO TBCR: 20.0000 meq | EXTENDED_RELEASE_TABLET | Freq: Two times a day (BID) | ORAL | 3 refills | Status: DC

## 2016-04-29 MED ORDER — TORSEMIDE 20 MG PO TABS: 60.0000 mg | ORAL_TABLET | Freq: Two times a day (BID) | ORAL | 3 refills | Status: DC

## 2016-04-29 NOTE — Telephone Encounter (Signed)
[facility]: pt is reporting a 4.5 lb weight gain overnight.  Saturday it was 208, Sunday-212, Monday-205, Today-209.5. Webb Silversmith will make a home visit tomorrow to verity how pt is weighing himself every day

## 2016-04-29 NOTE — Telephone Encounter (Signed)
If patient has truly had a 5 lbs weight gain, he should come to the clinic to be evaluated. Thanks Asiyah

## 2016-04-29 NOTE — Progress Notes (Signed)
Paramedicine Encounter    Patient ID: Leonard [name], male    DOB: 1950-12-24, 66 y.o.   MRN: [id number]   Patient Care Team: [name], MD as PCP - General (Family Medicine) [name], MD as Consulting Physician (Nephrology)  Patient Active Problem List   Diagnosis Date Noted  . Pleural effusion   . Oxygen desaturation   . Morbid obesity due to excess calories ([place]) 03/17/2016  . Anemia due to stage 4 chronic kidney disease treated with darbepoetin ([place]) 02/28/2016  . Fatigue 01/21/2016  . Diabetic retinopathy ([place]) 11/13/2015  . Dyspnea 08/28/2015  . CHF (congestive heart failure) ([place]) 08/27/2015  . Iron deficiency anemia 08/03/2015  . Claudication ([place])   . Type 2 diabetes mellitus with stage 3 chronic kidney disease, with long-term current use of insulin ([place])   . Secondary hypertension   . Hematuria 06/27/2015  . Skin lesion of face 02/22/2015  . 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 Anemia 01/15/2015  . CKD (chronic kidney disease) stage 4, GFR 15-29 ml/min (HCC) 01/15/2015  . Hypothyroidism 11/22/2014  . Acute on chronic diastolic congestive heart failure ([place]) 07/04/2014  . COPD GOLD III  02/16/2014  . History of tobacco use 08/23/2013  . 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 CAD (coronary artery disease) 04/06/2013  . PVD - hx of Rt SFA PTA and s/p Rt 4-5th toe amp Dec 2014 04/05/2013  . Essential hypertension 11/25/2012  . Mixed hyperlipidemia 07/17/2008    Current Outpatient Prescriptions:  .  amLODipine (NORVASC) 10 MG tablet, Take 1 tablet (10 mg total) by mouth daily., Disp: 30 tablet, Rfl: 6 .  aspirin 81 MG tablet, TAKE 81 MG BY MOUTH EVERY DAY, Disp: 30 tablet, Rfl: 3 .  atorvastatin (LIPITOR) 40 MG tablet, TAKE 1 TABLET BY MOUTH EVERY DAY AT 6 PM (Patient taking differently: Take 40 mg by mouth daily. ), Disp: 90 tablet, Rfl: 3 .  carvedilol (COREG) 25 MG tablet, Take 2 tablets (50 mg total) by mouth 2 (two) times  daily with a meal., Disp: 120 tablet, Rfl: 3 .  cloNIDine (CATAPRES) 0.2 MG tablet, TAKE ONE (1) TABLET BY MOUTH TWO (2) TIMES DAILY, Disp: 30 tablet, Rfl: 1 .  doxazosin (CARDURA) 8 MG tablet, Take 1 tablet (8 mg total) by mouth 2 (two) times daily., Disp: 60 tablet, Rfl: 6 .  ferrous sulfate (FERROUSUL) 325 (65 FE) MG tablet, Take 1 tablet (325 mg total) by mouth 2 (two) times daily with a meal., Disp: 60 tablet, Rfl: 3 .  hydrALAZINE (APRESOLINE) 100 MG tablet, TAKE ONE TABLET BY MOUTH EVERY EIGHT HOURS (Patient taking differently: Take 100 mg by mouth every 8 (eight) hours. ), Disp: 90 tablet, Rfl: 3 .  insulin aspart (NOVOLOG FLEXPEN) 100 UNIT/ML FlexPen, Inject 12 Units into the skin 3 (three) times daily with meals., Disp: , Rfl:  .  Insulin Glargine (LANTUS SOLOSTAR) 100 UNIT/ML Solostar Pen, Inject 40 Units into the skin at bedtime., Disp: , Rfl:  .  isosorbide mononitrate (IMDUR) 60 MG 24 hr tablet, Take 60 mg by mouth daily., Disp: , Rfl:  .  levothyroxine (SYNTHROID, LEVOTHROID) 50 MCG tablet, TAKE ONE (1) TABLET BY MOUTH EVERY DAY BEFORE BREAKFAST, Disp: 30 tablet, Rfl: 3 .  nitroGLYCERIN (NITROSTAT) 0.4 MG SL tablet, Place 1 tablet (0.4 mg total) under the tongue every 5 (five) minutes as needed for chest pain., Disp: 30 tablet, Rfl: 12 .  pantoprazole (PROTONIX) 40 MG tablet, TAKE ONE (  1) TABLET BY MOUTH EVERY DAY, Disp: 30 tablet, Rfl: 1 .  potassium chloride SA (K-DUR,KLOR-CON) 20 MEQ tablet, Take 27meq (1 tablet ) in the AM and 4meq (1/2 tablet) in the PM (Patient taking differently: Take 10-20 mEq by mouth See admin instructions. Take 79meq (1 tablet ) in the AM and 20meq (1/2 tablet) in the PM), Disp: 135 tablet, Rfl: 3 .  Tiotropium Bromide Monohydrate (SPIRIVA RESPIMAT) 2.5 MCG/ACT AERS, Inhale 2 puffs into the lungs daily. (Patient not taking: Reported on 04/29/2016), Disp: 1 Inhaler, Rfl: 0 .  torsemide (DEMADEX) 20 MG tablet, Take 2 tablets (40 mg total) by mouth 2 (two) times  daily. May take an extra 20 mg Daily for weight gain above 212 lbs, Disp: 130 tablet, Rfl: 3 Allergies  Allergen Reactions  . No Known Allergies      Social History   Social History  . Marital status: Single    Spouse name: N/A  . Number of children: N/A  . Years of education: N/A   Occupational History  . Not on file.   Social History Main Topics  . Smoking status: Former Smoker    Packs/day: 1.00    Years: 46.00    Types: Cigarettes    Start date: 02/04/1967    Quit date: 02/03/2013  . Smokeless tobacco: Never Used     Comment: Doing well with quitting.  . Alcohol use No  . Drug use: No  . Sexual activity: Not Currently   Other Topics Concern  . Not on file   Social History Narrative   Lives with sister, [name] in [place].  Retired - former Sports coach.    Physical Exam      SAFE - 03/18/16 1200      Situation   Admitting diagnosis chf   Heart failure history Exisiting   Comorbidities Anemia;Atrial fibillation;CKD/renal insufficiency;COPD;DM;HTN   Readmitted within 30 days No   Hospital admission within past 12 months Yes   number of hospital admissions 4   number of ED visits 4   Target Weight 212 lbs     Assessment   Lives alone Yes   Primary support person sister: betty Harner   Mode of transportation other   Other services involved Other  Home aide (bathe, clean)   [place];Wheelchair     Weight   Weighs self daily Yes   Scale provided Yes   Records on weight chart No     Resources   Has "Living better w/heart failure" book Yes   Has HF Zone tool Yes   Able to identify yellow zone signs/when to call MD Yes   Records zone daily Yes     Medications   Uses a pill box Yes   Who stocks the pill box --  [facility]   Pill box checked this visit Yes   Pill box refilled this visit Yes   Difficulty obtaining medications No   Mail order medications Yes   New medications at home today No   Next scheduled delivery of medications --   no refills needed   Missed one or more doses of medications per week Yes   How many missed doses this week 6     Nutrition   Patient receives meals on wheels No   Patient follows low sodium diet No   Has foods at home that meet the current recommended diet No   Patient follows low sugar/card diet No   Nutritional concerns/issues --  pt  refuses to eat low sodium diet     Activity Level   ADL's/Mobility Independent   How many feet can patient ambulate --  200   Typical activity level --  normal   Barriers --  none   Actvity tolerance: NYHA Class 3     Urine   Difficulty urinating No   Changes in urine None     Time spent with patient   Time spent with patient  50 Minutes        Future Appointments Date Time Provider [place]  05/08/2016 2:45 PM [facility] ONLY [facility] None  05/08/2016 3:15 PM [facility] FLUSH NURSE [facility] None  05/22/2016 10:30 AM [facility] LAB 4 [facility] None  05/22/2016 11:15 AM Heath Lark, MD [facility] None  05/22/2016 11:45 AM [facility] INJ NURSE [facility] None  06/02/2016 10:00 AM Hayden Pedro, MD [facility] None  06/13/2016 11:00 AM [facility] HS VASC 3 [facility]  06/13/2016 11:45 AM Sharmon Leyden Nickel, NP [facility]  06/16/2016 1:00 PM Hayden Pedro, MD [facility] None  07/23/2016 11:15 AM Gardiner Barefoot, DPM [facility]   CLINIC ATF pt CAO X4 with no complaints.  Pt went to doctor yesterday and was prescribed an eye antibiotic, which he is administering on his own daily.  Pt denies sob, dizziness, headache and chest pain. Pt stated that his weight was . Pt's weight in the clinic 215lb.  Pt still has edema in both legs.   Amy Increase torsemide to 60 BID (twice a day) (3 am and 3 afternoon). Potassium 20 twice a day. Pt was place on the vest in clinic and measured @ 43%.  BP (!) 148/62 (BP Location: Right Arm, Patient Position: Sitting, Cuff Size: Normal)   Pulse 65   Resp 16   Wt 209 lb 8 oz (95 kg)   SpO2 95%    BMI 31.85 kg/m   Weight yesterday-205 Last visit weight-210    ACTION: Home visit completed

## 2016-04-29 NOTE — Progress Notes (Signed)
PCP: Dr. Juanito Doom Cardiology: Dr. Aundra Dubin Nephrology: Dr Marval Regal.   66 yo with history of CAD s/p CABG, PAD, chronic diastolic CHF, CKD stage IV, COPD on home oxygen, and HTN presents for cardiology followup.  He has had multiple admissions this year for diastolic CHF.  Last echo in 5/17 showed EF 60-65% with grade II diastolic dysfunction.  He also has COPD with PFTs showing severe obstruction.    Today he returns for HF follow up. Last week he was volume overloaded. Vest reading was 40 and he was instructed to increased torsemide to 80 mg twice a day for 2 days. Weight at home 200-210 pounds. Overall feels ok. SOB with brisk walks. Walks slowly. Eating high salt diet such as whoopers, ham, and sausage. Misses a dose of medication daily. Followed closely by Paramedicine. Lives with his sister.   Labs (8/17): K 3.9, creatinine 2.33, HCT 28.5, LDL 46 Labs (11/17): hgb 9, K 4.4, creatinine 3, BNP 64 Labs 03/24/2016: K 3.5 Creatinine 2.48.   PMH: 1. Chronic diastolic CHF: Multiple recent admissions.  Echo (5/17) with EF 60-65%, grade II diastolic dysfunction, normal RV size and systolic function.  2. PAD: Right femoral PCI in 12/14. Peripheral arterial dopplers in 6/17 with occluded right SFA.  He follows with [facility].  3. HTN: Renal artery dopplers negative in 5/17.  4. Hypothyroidism 5. CKD stage IV: Follows with Dr. Marval Regal. 6. CAD: s/p CABG x 3 in 3/15.  7. OSA: Mild, not on CPAP.  8. COPD: PFTs (3/15) with FVC 77%, FEV1 47%, ratio 59%, TLC 55% => severe obstruction.  Prior smoker. He is on 2 L home O2.  9. 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 Hyperlipidemia 12. Anemia of renal disease   Social History   Social History  . Marital status: Single    Spouse name: N/A  . Number of children: N/A  . Years of education: N/A   Occupational History  . Not on file.   Social History Main Topics  . Smoking status: Former Smoker    Packs/day: 1.00    Years: 46.00    Types: Cigarettes    Start  date: 02/04/1967    Quit date: 02/03/2013  . Smokeless tobacco: Never Used     Comment: Doing well with quitting.  . Alcohol use No  . Drug use: No  . Sexual activity: Not Currently   Other Topics Concern  . Not on file   Social History Narrative   Lives with sister, [name] in [place].  Retired - former Sports coach.   Family History  Problem Relation Age of Onset  . Cancer Mother     lung cancer  . Hypertension Mother   . Cancer Sister     patient thinks it was uterine cancer  . Hypertension Sister   . Heart attack Sister   . Stroke Neg Hx    ROS: All systems reviewed and negative except as per HPI.   Current Outpatient Prescriptions  Medication Sig Dispense Refill  . amLODipine (NORVASC) 10 MG tablet Take 1 tablet (10 mg total) by mouth daily. 30 tablet 6  . aspirin 81 MG tablet TAKE 81 MG BY MOUTH EVERY DAY 30 tablet 3  . atorvastatin (LIPITOR) 40 MG tablet TAKE 1 TABLET BY MOUTH EVERY DAY AT 6 PM (Patient taking differently: Take 40 mg by mouth daily. ) 90 tablet 3  . carvedilol (COREG) 25 MG tablet Take 2 tablets (50 mg total) by mouth 2 (two) times daily with  a meal. 120 tablet 3  . cloNIDine (CATAPRES) 0.2 MG tablet TAKE ONE (1) TABLET BY MOUTH TWO (2) TIMES DAILY 30 tablet 1  . doxazosin (CARDURA) 8 MG tablet Take 1 tablet (8 mg total) by mouth 2 (two) times daily. 60 tablet 6  . ferrous sulfate (FERROUSUL) 325 (65 FE) MG tablet Take 1 tablet (325 mg total) by mouth 2 (two) times daily with a meal. 60 tablet 3  . hydrALAZINE (APRESOLINE) 100 MG tablet TAKE ONE TABLET BY MOUTH EVERY EIGHT HOURS (Patient taking differently: Take 100 mg by mouth every 8 (eight) hours. ) 90 tablet 3  . insulin aspart (NOVOLOG FLEXPEN) 100 UNIT/ML FlexPen Inject 12 Units into the skin 3 (three) times daily with meals.    . Insulin Glargine (LANTUS SOLOSTAR) 100 UNIT/ML Solostar Pen Inject 40 Units into the skin at bedtime.    . isosorbide mononitrate (IMDUR) 60 MG 24 hr tablet Take 60 mg  by mouth daily.    Marland Kitchen levothyroxine (SYNTHROID, LEVOTHROID) 50 MCG tablet TAKE ONE (1) TABLET BY MOUTH EVERY DAY BEFORE BREAKFAST 30 tablet 3  . pantoprazole (PROTONIX) 40 MG tablet TAKE ONE (1) TABLET BY MOUTH EVERY DAY 30 tablet 1  . potassium chloride SA (K-DUR,KLOR-CON) 20 MEQ tablet Take 22meq (1 tablet ) in the AM and 43meq (1/2 tablet) in the PM (Patient taking differently: Take 10-20 mEq by mouth See admin instructions. Take 63meq (1 tablet ) in the AM and 43meq (1/2 tablet) in the PM) 135 tablet 3  . torsemide (DEMADEX) 20 MG tablet Take 2 tablets (40 mg total) by mouth 2 (two) times daily. May take an extra 20 mg Daily for weight gain above 212 lbs 130 tablet 3  . nitroGLYCERIN (NITROSTAT) 0.4 MG SL tablet Place 1 tablet (0.4 mg total) under the tongue every 5 (five) minutes as needed for chest pain. 30 tablet 12  . Tiotropium Bromide Monohydrate (SPIRIVA RESPIMAT) 2.5 MCG/ACT AERS Inhale 2 puffs into the lungs daily. (Patient not taking: Reported on 04/29/2016) 1 Inhaler 0   No current facility-administered medications for this encounter.      BP (!) 148/62 (BP Location: Right Arm, Patient Position: Sitting, Cuff Size: Normal)   Pulse 65   Wt 215 lb 12.8 oz (97.9 kg)   SpO2 97%   BMI 32.81 kg/m    Filed Weights   04/29/16 1034  Weight: 215 lb 12.8 oz (97.9 kg)   General: NAD.Walked slowly in the clinic. Paramedicine present.   Neck: JVP ~10 cm, no thyromegaly or thyroid nodule.  Lungs: CTAB.  CV: Nondisplaced PMI.  Heart regular S1/S2, no S3/S4, no murmur.  L>R edema LLE 2+ edema RLE 1+edema. No carotid bruit.  Unable to palpate pedal pulses.  Abdomen:Soft, NT, ND.  Skin: Warm and dry.  Neurologic: Alert and oriented x 3.  Psych: Normal affect. Extremities: No clubbing or cyanosis. Warm. LUE AVF + bruit/thrill   HEENT: Normal.   Assessment/Plan: 1. Chronic diastolic CHF:  This is worsened by stage CKD.  NYHA III. Suspect COPD plays a role in this.  [place] Vest Reading 43%.   Volume overloaded in the setting of high salt diet. Increase torsemide to 60 mg twice a day. Increase potassium to 20 meq twice a day.  Do the following things EVERYDAY: 1) Weigh yourself in the morning before breakfast. Write it down and keep it in a log. 2) Take your medicines as prescribed 3) Eat low salt foods-Limit salt (sodium) to 2000 mg per day.  4) Stay as active as you can everyday 5) Limit all fluids for the day to less than 2 liters  2. HTN: A little elevated. Continue current regimen.    3. CAD: S/p CABG.  No CP.  Continue ASA 81 and statin.  Good lipids 8/17.   4. COPD: Severe by 3/15 PFTs.  He has quit smoking.  He is not using oxygen. Followed by Dr Melvyn Novas.  5. CKD: Stage V.-  L AVF. Sees Dr Marval Regal.  6. PAD: Not particularly limited by claudication now.  Saw [facility] 6/17, has followup.   Follow up in 4 weeks. Paramedicine following weekly. May need to see sooner if weight continues to rise. Will need to repeat Vest reading at his home.   Greater than 50% of the (total minutes 25) visit spent in counseling/coordination of care regarding heart failure and low salt diet.    Ladarrell Cornwall NP-C  04/29/2016

## 2016-04-29 NOTE — Progress Notes (Signed)
Paramedicine Encounter    Patient ID: Leonard [name], male    DOB: [birth year], 66 y.o.   MRN: [id number]   Patient Care Team: [name], MD as PCP - General (Family Medicine) [name], MD as Consulting Physician (Nephrology)  Patient Active Problem List   Diagnosis Date Noted  . Pleural effusion   . Oxygen desaturation   . Morbid obesity due to excess calories ([place]) 03/17/2016  . Anemia due to stage 4 chronic kidney disease treated with darbepoetin ([place]) 02/28/2016  . Fatigue 01/21/2016  . Diabetic retinopathy ([place]) 11/13/2015  . Dyspnea 08/28/2015  . CHF (congestive heart failure) ([place]) 08/27/2015  . Iron deficiency anemia 08/03/2015  . Claudication ([place])   . Type 2 diabetes mellitus with stage 3 chronic kidney disease, with long-term current use of insulin ([place])   . Secondary hypertension   . Hematuria 06/27/2015  . Skin lesion of face 02/22/2015  . 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 Anemia 01/15/2015  . CKD (chronic kidney disease) stage 4, GFR 15-29 ml/min (HCC) 01/15/2015  . Hypothyroidism 11/22/2014  . Acute on chronic diastolic congestive heart failure ([place]) 07/04/2014  . COPD GOLD III  02/16/2014  . History of tobacco use 08/23/2013  . 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 CAD (coronary artery disease) 04/06/2013  . PVD - hx of Rt SFA PTA and s/p Rt 4-5th toe amp Dec 2014 04/05/2013  . Essential hypertension 11/25/2012  . Mixed hyperlipidemia 07/17/2008    Current Outpatient Prescriptions:  .  amLODipine (NORVASC) 10 MG tablet, Take 1 tablet (10 mg total) by mouth daily., Disp: 30 tablet, Rfl: 6 .  aspirin 81 MG tablet, TAKE 81 MG BY MOUTH EVERY DAY, Disp: 30 tablet, Rfl: 3 .  atorvastatin (LIPITOR) 40 MG tablet, TAKE 1 TABLET BY MOUTH EVERY DAY AT 6 PM (Patient taking differently: Take 40 mg by mouth daily. ), Disp: 90 tablet, Rfl: 3 .  carvedilol (COREG) 25 MG tablet, Take 2 tablets (50 mg total) by mouth 2 (two) times  daily with a meal., Disp: 120 tablet, Rfl: 3 .  cloNIDine (CATAPRES) 0.2 MG tablet, TAKE ONE (1) TABLET BY MOUTH TWO (2) TIMES DAILY, Disp: 30 tablet, Rfl: 1 .  doxazosin (CARDURA) 8 MG tablet, Take 1 tablet (8 mg total) by mouth 2 (two) times daily., Disp: 60 tablet, Rfl: 6 .  ferrous sulfate (FERROUSUL) 325 (65 FE) MG tablet, Take 1 tablet (325 mg total) by mouth 2 (two) times daily with a meal., Disp: 60 tablet, Rfl: 3 .  hydrALAZINE (APRESOLINE) 100 MG tablet, TAKE ONE TABLET BY MOUTH EVERY EIGHT HOURS (Patient taking differently: Take 100 mg by mouth every 8 (eight) hours. ), Disp: 90 tablet, Rfl: 3 .  insulin aspart (NOVOLOG FLEXPEN) 100 UNIT/ML FlexPen, Inject 12 Units into the skin 3 (three) times daily with meals., Disp: , Rfl:  .  Insulin Glargine (LANTUS SOLOSTAR) 100 UNIT/ML Solostar Pen, Inject 40 Units into the skin at bedtime., Disp: , Rfl:  .  isosorbide mononitrate (IMDUR) 60 MG 24 hr tablet, Take 60 mg by mouth daily., Disp: , Rfl:  .  levothyroxine (SYNTHROID, LEVOTHROID) 50 MCG tablet, TAKE 1 TABLET BY MOUTH EVERYDAY BEFORE BREAKFAST, Disp: 30 tablet, Rfl: 2 .  nitroGLYCERIN (NITROSTAT) 0.4 MG SL tablet, Place 1 tablet (0.4 mg total) under the tongue every 5 (five) minutes as needed for chest pain., Disp: 30 tablet, Rfl: 12 .  pantoprazole (PROTONIX) 40 MG tablet, TAKE ONE (1) TABLET  BY MOUTH EVERY DAY, Disp: 30 tablet, Rfl: 1 .  potassium chloride SA (K-DUR,KLOR-CON) 20 MEQ tablet, Take 1 tablet (20 mEq total) by mouth 2 (two) times daily., Disp: 135 tablet, Rfl: 3 .  Tiotropium Bromide Monohydrate (SPIRIVA RESPIMAT) 2.5 MCG/ACT AERS, Inhale 2 puffs into the lungs daily. (Patient not taking: Reported on 04/29/2016), Disp: 1 Inhaler, Rfl: 0 .  torsemide (DEMADEX) 20 MG tablet, Take 3 tablets (60 mg total) by mouth 2 (two) times daily. May take an extra 20 mg Daily for weight gain above 212 lbs, Disp: 190 tablet, Rfl: 3 Allergies  Allergen Reactions  . No Known Allergies       Social History   Social History  . Marital status: Single    Spouse name: N/A  . Number of children: N/A  . Years of education: N/A   Occupational History  . Not on file.   Social History Main Topics  . Smoking status: Former Smoker    Packs/day: 1.00    Years: 46.00    Types: Cigarettes    Start date: 02/04/1967    Quit date: 02/03/2013  . Smokeless tobacco: Never Used     Comment: Doing well with quitting.  . Alcohol use No  . Drug use: No  . Sexual activity: Not Currently   Other Topics Concern  . Not on file   Social History Narrative   Lives with sister, [name] in [place].  Retired - former Sports coach.    Physical Exam      SAFE - 03/18/16 1200      Situation   Admitting diagnosis chf   Heart failure history Exisiting   Comorbidities Anemia;Atrial fibillation;CKD/renal insufficiency;COPD;DM;HTN   Readmitted within 30 days No   Hospital admission within past 12 months Yes   number of hospital admissions 4   number of ED visits 4   Target Weight 212 lbs     Assessment   Lives alone Yes   Primary support person sister: betty [place]   Mode of transportation other   Other services involved Other  Home aide (bathe, clean)   [place];Wheelchair     [employer]   Weighs self daily Yes   Scale provided Yes   Records on weight chart No     Resources   Has "Living better w/heart failure" book Yes   Has HF Zone tool Yes   Able to identify yellow zone signs/when to call MD Yes   Records zone daily Yes     Medications   Uses a pill box Yes   Who stocks the pill box --  [facility]   Pill box checked this visit Yes   Pill box refilled this visit Yes   Difficulty obtaining medications No   Mail order medications Yes   New medications at home today No   Next scheduled delivery of medications --  no refills needed   Missed one or more doses of medications per week Yes   How many missed doses this week 6     Nutrition   Patient receives  meals on wheels No   Patient follows low sodium diet No   Has foods at home that meet the current recommended diet No   Patient follows low sugar/card diet No   Nutritional concerns/issues --  pt refuses to eat low sodium diet     Activity Level   ADL's/Mobility Independent   How many feet can patient ambulate --  200   Typical activity  level --  normal   Barriers --  none   Actvity tolerance: NYHA Class 3     Urine   Difficulty urinating No   Changes in urine None     Time spent with patient   Time spent with patient  50 Minutes        Future Appointments Date Time Provider [place]  05/08/2016 2:45 PM [facility] ONLY [facility] None  05/08/2016 3:15 PM [facility] FLUSH NURSE [facility] None  05/09/2016 10:45 AM [facility] LAB [facility] None  05/22/2016 10:30 AM [facility] LAB 4 [facility] None  05/22/2016 11:15 AM Ni Gorsuch, MD [facility] None  05/22/2016 11:45 AM [facility] INJ NURSE [facility] None  05/27/2016 11:00 AM [facility] PA/NP [facility] None  06/02/2016 10:00 AM Hayden Pedro, MD [facility] None  06/13/2016 11:00 AM [facility] HS VASC 3 [facility]  06/13/2016 11:45 AM Sharmon Leyden Nickel, NP [facility]  06/16/2016 1:00 PM Hayden Pedro, MD [facility] None  07/23/2016 11:15 AM Gardiner Barefoot, DPM [facility]   RE-VISIT [facility] re-visit, to re-fill pt's pill box due to pt leaving it at home and not bringing it to clinic visit.  Called in: asprin, isosrbide (until Tuesday morning) BP (!) 146/90 (BP Location: Right Arm, Patient Position: Sitting, Cuff Size: Normal)   Pulse 71   Resp 16   SpO2 94%       ACTION: Home visit completed

## 2016-04-29 NOTE — Patient Instructions (Signed)
INCREASE Torsemide to 60 mg (3 tabs) twice a day INCREASE Potassium to 20 meQ, one tab twice a day  Labs today We will only contact you if something comes back abnormal or we need to make some changes. Otherwise no news is good news!   Your physician recommends that you schedule a follow-up appointment in: 4 weeks with Amy Clegg, NP-C  Do the following things EVERYDAY: 1) Weigh yourself in the morning before breakfast. Write it down and keep it in a log. 2) Take your medicines as prescribed 3) Eat low salt foods-Limit salt (sodium) to 2000 mg per day.  4) Stay as active as you can everyday 5) Limit all fluids for the day to less than 2 liters

## 2016-04-29 NOTE — Progress Notes (Signed)
Advanced Heart Failure Medication Review by a Pharmacist  Does the patient  feel that his/her medications are working for him/her?  yes  Has the patient been experiencing any side effects to the medications prescribed?  no  Does the patient measure his/her own blood pressure or blood glucose at home?  yes   Does the patient have any problems obtaining medications due to transportation or finances?   no  Understanding of regimen: good Understanding of indications: good Potential of compliance: good Patient understands to avoid NSAIDs. Patient understands to avoid decongestants.  Issues to address at subsequent visits: None   Pharmacist comments: Mr. Casasola is a pleasant 66 yo AA male presenting to [facility] with [facility], paramedicine, and his medication bottles. No discrepancies noted. Patient took 2 extra doses of Torsemide 80 mg bid x 2 days (3/20, 3/21) for VEST 44% and elevated BP 164/64 measured by paramedicine. Recent med addition includes: Polymixin B sulfate/trimethoprim ophthalmic solution - 1 drop in R eye qid x 2 days after each monthly eye injection. No questions or concerns for me at this time.   Belia Heman, PharmD PGY1 Pharmacy Resident [phone] (Pager) 04/29/2016 10:52 AM  Time with patient: 10 min Preparation and documentation time: 2 min Total time: 12 min

## 2016-04-30 ENCOUNTER — Telehealth: Payer: Self-pay | Admitting: Family Medicine

## 2016-04-30 NOTE — Telephone Encounter (Signed)
Webb Silversmith wanted to let April know that she followed up with pt and everything is ok and she had to re-educate pt. Webb Silversmith would like April to call her back. ep

## 2016-04-30 NOTE — Telephone Encounter (Signed)
Called Webb Silversmith back and she said that she went out there today and pt was down 5.5lbs. She said he did have some bilateral pitting and educated pt on what to look for with this.  She stated that pt said he went to the heart failure clinic and they are aware of this and will just continue to monitor him. Routing to PCP as an Pharmacist, hospital. Katharina Caper, [name], [name]

## 2016-04-30 NOTE — Telephone Encounter (Signed)
Contacted Bryson Dames @ ext. [phone] to give her the below message.  She said she was going out today and based on what she sees with his weight will let him know if he needs to come in and be evaluated. Katharina Caper, Staria Birkhead D, [name]

## 2016-05-06 ENCOUNTER — Other Ambulatory Visit (HOSPITAL_COMMUNITY): Payer: Self-pay

## 2016-05-06 ENCOUNTER — Other Ambulatory Visit (HOSPITAL_COMMUNITY): Payer: Self-pay | Admitting: *Deleted

## 2016-05-06 ENCOUNTER — Other Ambulatory Visit: Payer: Self-pay | Admitting: Student

## 2016-05-06 MED ORDER — ISOSORBIDE MONONITRATE ER 60 MG PO TB24: 60.0000 mg | ORAL_TABLET | Freq: Every day | ORAL | 3 refills | Status: DC

## 2016-05-06 NOTE — Progress Notes (Signed)
Paramedicine Encounter    Patient ID: Leonard [name], male    DOB: 1950/11/15, 66 y.o.   MRN: [id number]   Patient Care Team: [name], MD as PCP - General (Family Medicine) [name], MD as Consulting Physician (Nephrology)  Patient Active Problem List   Diagnosis Date Noted  . Pleural effusion   . Oxygen desaturation   . Morbid obesity due to excess calories ([place]) 03/17/2016  . Anemia due to stage 4 chronic kidney disease treated with darbepoetin ([place]) 02/28/2016  . Fatigue 01/21/2016  . Diabetic retinopathy ([place]) 11/13/2015  . Dyspnea 08/28/2015  . CHF (congestive heart failure) ([place]) 08/27/2015  . Iron deficiency anemia 08/03/2015  . Claudication ([place])   . Type 2 diabetes mellitus with stage 3 chronic kidney disease, with long-term current use of insulin ([place])   . Secondary hypertension   . Hematuria 06/27/2015  . Skin lesion of face 02/22/2015  . 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 Anemia 01/15/2015  . CKD (chronic kidney disease) stage 4, GFR 15-29 ml/min (HCC) 01/15/2015  . Hypothyroidism 11/22/2014  . Acute on chronic diastolic congestive heart failure ([place]) 07/04/2014  . COPD GOLD III  02/16/2014  . History of tobacco use 08/23/2013  . 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 CAD (coronary artery disease) 04/06/2013  . PVD - hx of Rt SFA PTA and s/p Rt 4-5th toe amp Dec 2014 04/05/2013  . Essential hypertension 11/25/2012  . Mixed hyperlipidemia 07/17/2008    Current Outpatient Prescriptions:  .  amLODipine (NORVASC) 10 MG tablet, Take 1 tablet (10 mg total) by mouth daily., Disp: 30 tablet, Rfl: 6 .  aspirin 81 MG tablet, TAKE 81 MG BY MOUTH EVERY DAY, Disp: 30 tablet, Rfl: 3 .  atorvastatin (LIPITOR) 40 MG tablet, TAKE 1 TABLET BY MOUTH EVERY DAY AT 6 PM (Patient taking differently: Take 40 mg by mouth daily. ), Disp: 90 tablet, Rfl: 3 .  carvedilol (COREG) 25 MG tablet, Take 2 tablets (50 mg total) by mouth 2 (two) times  daily with a meal., Disp: 120 tablet, Rfl: 3 .  cloNIDine (CATAPRES) 0.2 MG tablet, TAKE ONE (1) TABLET BY MOUTH TWO (2) TIMES DAILY, Disp: 30 tablet, Rfl: 1 .  doxazosin (CARDURA) 8 MG tablet, Take 1 tablet (8 mg total) by mouth 2 (two) times daily., Disp: 60 tablet, Rfl: 6 .  ferrous sulfate (FERROUSUL) 325 (65 FE) MG tablet, Take 1 tablet (325 mg total) by mouth 2 (two) times daily with a meal., Disp: 60 tablet, Rfl: 3 .  hydrALAZINE (APRESOLINE) 100 MG tablet, TAKE ONE TABLET BY MOUTH EVERY EIGHT HOURS (Patient taking differently: Take 100 mg by mouth every 8 (eight) hours. ), Disp: 90 tablet, Rfl: 3 .  insulin aspart (NOVOLOG FLEXPEN) 100 UNIT/ML FlexPen, Inject 12 Units into the skin 3 (three) times daily with meals., Disp: , Rfl:  .  Insulin Glargine (LANTUS SOLOSTAR) 100 UNIT/ML Solostar Pen, Inject 40 Units into the skin at bedtime., Disp: , Rfl:  .  levothyroxine (SYNTHROID, LEVOTHROID) 50 MCG tablet, TAKE 1 TABLET BY MOUTH EVERYDAY BEFORE BREAKFAST, Disp: 30 tablet, Rfl: 2 .  pantoprazole (PROTONIX) 40 MG tablet, TAKE ONE (1) TABLET BY MOUTH EVERY DAY, Disp: 30 tablet, Rfl: 1 .  potassium chloride SA (K-DUR,KLOR-CON) 20 MEQ tablet, Take 1 tablet (20 mEq total) by mouth 2 (two) times daily., Disp: 135 tablet, Rfl: 3 .  torsemide (DEMADEX) 20 MG tablet, Take 3 tablets (60 mg total) by mouth 2 (  two) times daily. May take an extra 20 mg Daily for weight gain above 212 lbs, Disp: 190 tablet, Rfl: 3 .  isosorbide mononitrate (IMDUR) 60 MG 24 hr tablet, Take 1 tablet (60 mg total) by mouth daily., Disp: 30 tablet, Rfl: 3 .  nitroGLYCERIN (NITROSTAT) 0.4 MG SL tablet, Place 1 tablet (0.4 mg total) under the tongue every 5 (five) minutes as needed for chest pain., Disp: 30 tablet, Rfl: 12 .  Tiotropium Bromide Monohydrate (SPIRIVA RESPIMAT) 2.5 MCG/ACT AERS, Inhale 2 puffs into the lungs daily. (Patient not taking: Reported on 04/29/2016), Disp: 1 Inhaler, Rfl: 0 Allergies  Allergen Reactions  . No  Known Allergies      Social History   Social History  . Marital status: Single    Spouse name: N/A  . Number of children: N/A  . Years of education: N/A   Occupational History  . Not on file.   Social History Main Topics  . Smoking status: Former Smoker    Packs/day: 1.00    Years: 46.00    Types: Cigarettes    Start date: 02/04/1967    Quit date: 02/03/2013  . Smokeless tobacco: Never Used     Comment: Doing well with quitting.  . Alcohol use No  . Drug use: No  . Sexual activity: Not Currently   Other Topics Concern  . Not on file   Social History Narrative   Lives with sister, [name] in [place].  Retired - former Sports coach.    Physical Exam  Eyes: Pupils are equal, round, and reactive to light.  Pulmonary/Chest: No respiratory distress. He has no wheezes. He has no rales.  Abdominal: He exhibits no distension. There is no tenderness.  Musculoskeletal: He exhibits edema.  Skin: Skin is warm and dry. He is not diaphoretic.        SAFE - 03/18/16 1200      Situation   Admitting diagnosis chf   Heart failure history Exisiting   Comorbidities Anemia;Atrial fibillation;CKD/renal insufficiency;COPD;DM;HTN   Readmitted within 30 days No   Hospital admission within past 12 months Yes   number of hospital admissions 4   number of ED visits 4   Target Weight 212 lbs     Assessment   Lives alone Yes   Primary support person sister: [name]   Mode of transportation other   Other services involved Other  Home aide (bathe, clean)   [place];Wheelchair     [employer]   Weighs self daily Yes   Scale provided Yes   Records on weight chart No     Resources   Has "Living better w/heart failure" book Yes   Has HF Zone tool Yes   Able to identify yellow zone signs/when to call MD Yes   Records zone daily Yes     Medications   Uses a pill box Yes   Who stocks the pill box --  [facility]   Pill box checked this visit Yes   Pill box refilled this  visit Yes   Difficulty obtaining medications No   Mail order medications Yes   New medications at home today No   Next scheduled delivery of medications --  no refills needed   Missed one or more doses of medications per week Yes   How many missed doses this week 6     Nutrition   Patient receives meals on wheels No   Patient follows low sodium diet No   Has foods  at home that meet the current recommended diet No   Patient follows low sugar/card diet No   Nutritional concerns/issues --  pt refuses to eat low sodium diet     Activity Level   ADL's/Mobility Independent   How many feet can patient ambulate --  200   Typical activity level --  normal   Barriers --  none   Actvity tolerance: NYHA Class 3     Urine   Difficulty urinating No   Changes in urine None     Time spent with patient   Time spent with patient  50 Minutes        Future Appointments Date Time Provider [place]  05/08/2016 2:45 PM [facility] LAB ONLY [facility] None  05/08/2016 3:15 PM [facility] FLUSH NURSE [facility] None  05/09/2016 10:45 AM [facility] LAB [facility] None  05/22/2016 10:30 AM [facility] LAB 4 [facility] None  05/22/2016 11:15 AM Ni Gorsuch, MD [facility] None  05/22/2016 11:45 AM [facility] INJ NURSE [facility] None  05/27/2016 11:00 AM [facility] PA/NP [facility] None  06/02/2016 10:00 AM Hayden Pedro, MD [facility] None  06/13/2016 11:00 AM [facility] HS VASC 3 [facility] VVS  06/13/2016 11:45 AM Sharmon Leyden Nickel, NP [facility]  06/16/2016 1:00 PM Hayden Pedro, MD [facility] None  07/23/2016 11:15 AM Gardiner Barefoot, DPM [facility]   ATF pt CAO x4 with no complaints today.  Pt stated that he had collard greens, [place], and potatoe salad over the holiday weekend.  Pt denies sob, headaches, lightheadiness, and chest pain. rx bottles and pill box verified.  Pt is missing the following rx; those medications were called in. Pt agreed to call me once his prescriptions are delivered.  Vest  reading 31% **Asa until sat (none in sat-tues), no isosorbide **called in: ASA, iso, lantus  BP (!) 152/70 (BP Location: Right Arm, Patient Position: Sitting, Cuff Size: Normal)   Pulse 72   Resp 16   Wt 209 lb (94.8 kg)   SpO2 98%   BMI 31.78 kg/m   Weight yesterday-208 Last visit weight-209    ACTION: Home visit completed

## 2016-05-07 ENCOUNTER — Other Ambulatory Visit (HOSPITAL_COMMUNITY): Payer: Self-pay

## 2016-05-07 NOTE — Progress Notes (Signed)
Paramedicine Encounter    Patient ID: Leonard [name], male    DOB: 1950/06/24, 66 y.o.   MRN: [id number]   Patient Care Team: [name], MD as PCP - General (Family Medicine) [name], MD as Consulting Physician (Nephrology)  Patient Active Problem List   Diagnosis Date Noted  . Pleural effusion   . Oxygen desaturation   . Morbid obesity due to excess calories ([place]) 03/17/2016  . Anemia due to stage 4 chronic kidney disease treated with darbepoetin ([place]) 02/28/2016  . Fatigue 01/21/2016  . Diabetic retinopathy ([place]) 11/13/2015  . Dyspnea 08/28/2015  . CHF (congestive heart failure) ([place]) 08/27/2015  . Iron deficiency anemia 08/03/2015  . Claudication ([place])   . Type 2 diabetes mellitus with stage 3 chronic kidney disease, with long-term current use of insulin ([place])   . Secondary hypertension   . Hematuria 06/27/2015  . Skin lesion of face 02/22/2015  . 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 Anemia 01/15/2015  . CKD (chronic kidney disease) stage 4, GFR 15-29 ml/min (HCC) 01/15/2015  . Hypothyroidism 11/22/2014  . Acute on chronic diastolic congestive heart failure ([place]) 07/04/2014  . COPD GOLD III  02/16/2014  . History of tobacco use 08/23/2013  . 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 CAD (coronary artery disease) 04/06/2013  . PVD - hx of Rt SFA PTA and s/p Rt 4-5th toe amp Dec 2014 04/05/2013  . Essential hypertension 11/25/2012  . Mixed hyperlipidemia 07/17/2008    Current Outpatient Prescriptions:  .  amLODipine (NORVASC) 10 MG tablet, Take 1 tablet (10 mg total) by mouth daily., Disp: 30 tablet, Rfl: 6 .  aspirin 81 MG tablet, TAKE 81 MG BY MOUTH EVERY DAY, Disp: 30 tablet, Rfl: 3 .  atorvastatin (LIPITOR) 40 MG tablet, TAKE 1 TABLET BY MOUTH EVERY DAY AT 6 PM (Patient taking differently: Take 40 mg by mouth daily. ), Disp: 90 tablet, Rfl: 3 .  carvedilol (COREG) 25 MG tablet, Take 2 tablets (50 mg total) by mouth 2 (two) times  daily with a meal., Disp: 120 tablet, Rfl: 3 .  cloNIDine (CATAPRES) 0.2 MG tablet, TAKE ONE (1) TABLET BY MOUTH TWO (2) TIMES DAILY, Disp: 30 tablet, Rfl: 1 .  doxazosin (CARDURA) 8 MG tablet, Take 1 tablet (8 mg total) by mouth 2 (two) times daily., Disp: 60 tablet, Rfl: 6 .  ferrous sulfate (FERROUSUL) 325 (65 FE) MG tablet, Take 1 tablet (325 mg total) by mouth 2 (two) times daily with a meal., Disp: 60 tablet, Rfl: 3 .  hydrALAZINE (APRESOLINE) 100 MG tablet, TAKE ONE TABLET BY MOUTH EVERY EIGHT HOURS (Patient taking differently: Take 100 mg by mouth every 8 (eight) hours. ), Disp: 90 tablet, Rfl: 3 .  insulin aspart (NOVOLOG FLEXPEN) 100 UNIT/ML FlexPen, Inject 12 Units into the skin 3 (three) times daily with meals., Disp: , Rfl:  .  Insulin Glargine (LANTUS SOLOSTAR) 100 UNIT/ML Solostar Pen, Inject 40 Units into the skin at bedtime., Disp: , Rfl:  .  isosorbide mononitrate (IMDUR) 60 MG 24 hr tablet, Take 1 tablet (60 mg total) by mouth daily., Disp: 30 tablet, Rfl: 3 .  levothyroxine (SYNTHROID, LEVOTHROID) 50 MCG tablet, TAKE 1 TABLET BY MOUTH EVERYDAY BEFORE BREAKFAST, Disp: 30 tablet, Rfl: 2 .  nitroGLYCERIN (NITROSTAT) 0.4 MG SL tablet, Place 1 tablet (0.4 mg total) under the tongue every 5 (five) minutes as needed for chest pain., Disp: 30 tablet, Rfl: 12 .  pantoprazole (PROTONIX) 40 MG tablet,  TAKE ONE (1) TABLET BY MOUTH EVERY DAY, Disp: 30 tablet, Rfl: 1 .  potassium chloride SA (K-DUR,KLOR-CON) 20 MEQ tablet, Take 1 tablet (20 mEq total) by mouth 2 (two) times daily., Disp: 135 tablet, Rfl: 3 .  Tiotropium Bromide Monohydrate (SPIRIVA RESPIMAT) 2.5 MCG/ACT AERS, Inhale 2 puffs into the lungs daily. (Patient not taking: Reported on 04/29/2016), Disp: 1 Inhaler, Rfl: 0 .  torsemide (DEMADEX) 20 MG tablet, Take 3 tablets (60 mg total) by mouth 2 (two) times daily. May take an extra 20 mg Daily for weight gain above 212 lbs, Disp: 190 tablet, Rfl: 3 Allergies  Allergen Reactions  . No  Known Allergies      Social History   Social History  . Marital status: Single    Spouse name: N/A  . Number of children: N/A  . Years of education: N/A   Occupational History  . Not on file.   Social History Main Topics  . Smoking status: Former Smoker    Packs/day: 1.00    Years: 46.00    Types: Cigarettes    Start date: 02/04/1967    Quit date: 02/03/2013  . Smokeless tobacco: Never Used     Comment: Doing well with quitting.  . Alcohol use No  . Drug use: No  . Sexual activity: Not Currently   Other Topics Concern  . Not on file   Social History Narrative   Lives with sister, [name] in [place].  Retired - former Sports coach.    Physical Exam      SAFE - 03/18/16 1200      Situation   Admitting diagnosis chf   Heart failure history Exisiting   Comorbidities Anemia;Atrial fibillation;CKD/renal insufficiency;COPD;DM;HTN   Readmitted within 30 days No   Hospital admission within past 12 months Yes   number of hospital admissions 4   number of ED visits 4   Target Weight 212 lbs     Assessment   Lives alone Yes   Primary support person sister: betty House   Mode of transportation other   Other services involved Other  Home aide (bathe, clean)   [place];Wheelchair     [employer]   Weighs self daily Yes   Scale provided Yes   Records on weight chart No     Resources   Has "Living better w/heart failure" book Yes   Has HF Zone tool Yes   Able to identify yellow zone signs/when to call MD Yes   Records zone daily Yes     Medications   Uses a pill box Yes   Who stocks the pill box --  [facility]   Pill box checked this visit Yes   Pill box refilled this visit Yes   Difficulty obtaining medications No   Mail order medications Yes   New medications at home today No   Next scheduled delivery of medications --  no refills needed   Missed one or more doses of medications per week Yes   How many missed doses this week 6     Nutrition    Patient receives meals on wheels No   Patient follows low sodium diet No   Has foods at home that meet the current recommended diet No   Patient follows low sugar/card diet No   Nutritional concerns/issues --  pt refuses to eat low sodium diet     Activity Level   ADL's/Mobility Independent   How many feet can patient ambulate --  200  Typical activity level --  normal   Barriers --  none   Actvity tolerance: NYHA Class 3     Urine   Difficulty urinating No   Changes in urine None     Time spent with patient   Time spent with patient  50 Minutes          ReDS Vest - 05/06/16 1700      ReDS Vest   Estimated volume prior to reading High   Fitting Posture Standing   Height Marker Tall   Ruler Value 9.5   [name]   ReDS Value 31      Future Appointments Date Time Provider [place]  05/08/2016 2:45 PM [facility] ONLY [facility] None  05/08/2016 3:15 PM [facility] FLUSH NURSE [facility] None  05/09/2016 10:45 AM [facility] LAB [facility] None  05/22/2016 10:30 AM [facility] LAB 4 [facility] None  05/22/2016 11:15 AM Ni Gorsuch, MD [facility] None  05/22/2016 11:45 AM [facility] INJ NURSE [facility] None  05/27/2016 11:00 AM [facility] PA/NP [facility] None  06/02/2016 10:00 AM Hayden Pedro, MD [facility] None  06/13/2016 11:00 AM [facility] HS VASC 3 [facility] V[facility]  06/13/2016 11:45 AM Sharmon Leyden Nickel, NP [facility]  06/16/2016 1:00 PM Hayden Pedro, MD [facility] None  07/23/2016 11:15 AM Gardiner Barefoot, DPM [facility]   Re-visit Medication box refill (asa, isosorbide). Pt has no complaints today. Vitals noted    BP 140/60 (BP Location: Right Arm, Patient Position: Sitting, Cuff Size: Normal)   Pulse 76   SpO2 97%     ACTION: Home visit completed

## 2016-05-08 ENCOUNTER — Other Ambulatory Visit (HOSPITAL_COMMUNITY): Payer: Self-pay | Admitting: *Deleted

## 2016-05-08 ENCOUNTER — Other Ambulatory Visit (HOSPITAL_BASED_OUTPATIENT_CLINIC_OR_DEPARTMENT_OTHER): Payer: Medicaid Other

## 2016-05-08 ENCOUNTER — Ambulatory Visit (HOSPITAL_BASED_OUTPATIENT_CLINIC_OR_DEPARTMENT_OTHER): Payer: Medicaid Other

## 2016-05-08 VITALS — BP 135/51 | HR 76 | Temp 98.0°F | Resp 16

## 2016-05-08 DIAGNOSIS — D631 Anemia in chronic kidney disease: Secondary | ICD-10-CM

## 2016-05-08 DIAGNOSIS — N184 Chronic kidney disease, stage 4 (severe): Secondary | ICD-10-CM | POA: Diagnosis present

## 2016-05-08 DIAGNOSIS — N189 Chronic kidney disease, unspecified: Principal | ICD-10-CM

## 2016-05-08 LAB — CBC WITH DIFFERENTIAL/PLATELET
BASO%: 0.7 % (ref 0.0–2.0)
Basophils Absolute: 0 10*3/uL (ref 0.0–0.1)
EOS%: 2.9 % (ref 0.0–7.0)
Eosinophils Absolute: 0.2 10*3/uL (ref 0.0–0.5)
HEMATOCRIT: 29.6 % — AB (ref 38.4–49.9)
HGB: 9.3 g/dL — ABNORMAL LOW (ref 13.0–17.1)
LYMPH#: 1 10*3/uL (ref 0.9–3.3)
LYMPH%: 18.7 % (ref 14.0–49.0)
MCH: 24.9 pg — AB (ref 27.2–33.4)
MCHC: 31.3 g/dL — AB (ref 32.0–36.0)
MCV: 79.6 fL (ref 79.3–98.0)
MONO#: 0.6 10*3/uL (ref 0.1–0.9)
MONO%: 10.9 % (ref 0.0–14.0)
NEUT#: 3.5 10*3/uL (ref 1.5–6.5)
NEUT%: 66.8 % (ref 39.0–75.0)
PLATELETS: 156 10*3/uL (ref 140–400)
RBC: 3.72 10*6/uL — AB (ref 4.20–5.82)
RDW: 21.8 % — ABNORMAL HIGH (ref 11.0–14.6)
WBC: 5.3 10*3/uL (ref 4.0–10.3)

## 2016-05-08 MED ORDER — DARBEPOETIN ALFA 500 MCG/ML IJ SOSY
500.0000 ug | PREFILLED_SYRINGE | Freq: Once | INTRAMUSCULAR | Status: AC
  Administered 2016-05-08: 500 ug via SUBCUTANEOUS
  Filled 2016-05-08: qty 1

## 2016-05-09 ENCOUNTER — Ambulatory Visit (HOSPITAL_COMMUNITY)
Admission: RE | Admit: 2016-05-09 | Discharge: 2016-05-09 | Disposition: A | Discharge status: Home / Self Care | Payer: Medicaid Other | Source: Ambulatory Visit | Attending: Internal Medicine | Admitting: Internal Medicine

## 2016-05-09 DIAGNOSIS — I5032 Chronic diastolic (congestive) heart failure: Secondary | ICD-10-CM | POA: Diagnosis not present

## 2016-05-09 LAB — BASIC METABOLIC PANEL
ANION GAP: 12 (ref 5–15)
BUN: 62 mg/dL — ABNORMAL HIGH (ref 6–20)
CALCIUM: 8.7 mg/dL — AB (ref 8.9–10.3)
CO2: 25 mmol/L (ref 22–32)
CREATININE: 3.13 mg/dL — AB (ref 0.61–1.24)
Chloride: 101 mmol/L (ref 101–111)
GFR, EST AFRICAN AMERICAN: 23 mL/min — AB (ref >60)
GFR, EST NON AFRICAN AMERICAN: 20 mL/min — AB (ref >60)
GLUCOSE: 247 mg/dL — AB (ref 65–99)
Potassium: 4.3 mmol/L (ref 3.5–5.1)
Sodium: 138 mmol/L (ref 135–145)

## 2016-05-12 ENCOUNTER — Telehealth: Payer: Self-pay | Admitting: *Deleted

## 2016-05-12 NOTE — Telephone Encounter (Signed)
[facility] team please call patient and have him schedule an appointment with [place] preferably in the next 1-2 days to evaluate. Thank you.   Smitty Cords, MD [facility], PGY-2

## 2016-05-12 NOTE — Telephone Encounter (Signed)
Anra with [facility] left voice message on nurse line stating patient has had an increased in weight. She has tried to contact the patient but unable to reach him.  She also has faced over trans-report to [facility] for review.  Please advise.   Derl Barrow, RN

## 2016-05-12 NOTE — Telephone Encounter (Signed)
Tried to contact pt to see about getting him scheduled and the phone only rang with no option to LM. If pt calls back please help him get and appointment scheduled in next day or two. Katharina Caper, [name], [name]

## 2016-05-13 ENCOUNTER — Other Ambulatory Visit (HOSPITAL_COMMUNITY): Payer: Self-pay

## 2016-05-13 ENCOUNTER — Telehealth (HOSPITAL_COMMUNITY): Payer: Self-pay | Admitting: *Deleted

## 2016-05-13 ENCOUNTER — Other Ambulatory Visit: Payer: Self-pay | Admitting: Family Medicine

## 2016-05-13 NOTE — Progress Notes (Signed)
Paramedicine Encounter    Patient ID: Leonard [name], male    DOB: [birth year], 66 y.o.   MRN: [id number]   Patient Care Team: [name], MD as PCP - General (Family Medicine) [name], MD as Consulting Physician (Nephrology)  Patient Active Problem List   Diagnosis Date Noted  . Pleural effusion   . Oxygen desaturation   . Morbid obesity due to excess calories ([place]) 03/17/2016  . Anemia due to stage 4 chronic kidney disease treated with darbepoetin ([place]) 02/28/2016  . Fatigue 01/21/2016  . Diabetic retinopathy ([place]) 11/13/2015  . Dyspnea 08/28/2015  . CHF (congestive heart failure) ([place]) 08/27/2015  . Iron deficiency anemia 08/03/2015  . Claudication ([place])   . 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 Secondary hypertension   . Hematuria 06/27/2015  . Skin lesion of face 02/22/2015  . 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 Anemia 01/15/2015  . CKD (chronic kidney disease) stage 4, GFR 15-29 ml/min (HCC) 01/15/2015  . Hypothyroidism 11/22/2014  . Acute on chronic diastolic congestive heart failure ([place]) 07/04/2014  . COPD GOLD III  02/16/2014  . History of tobacco use 08/23/2013  . 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 CAD (coronary artery disease) 04/06/2013  . PVD - hx of Rt SFA PTA and s/p Rt 4-5th toe amp Dec 2014 04/05/2013  . Essential hypertension 11/25/2012  . Mixed hyperlipidemia 07/17/2008    Current Outpatient Prescriptions:  .  amLODipine (NORVASC) 10 MG tablet, Take 1 tablet (10 mg total) by mouth daily., Disp: 30 tablet, Rfl: 6 .  aspirin 81 MG tablet, TAKE 81 MG BY MOUTH EVERY DAY, Disp: 30 tablet, Rfl: 3 .  atorvastatin (LIPITOR) 40 MG tablet, TAKE 1 TABLET BY MOUTH EVERY DAY AT 6 PM (Patient taking differently: Take 40 mg by mouth daily. ), Disp: 90 tablet, Rfl: 3 .  carvedilol (COREG) 25 MG tablet, Take 2 tablets (50 mg total) by mouth 2 (two) times  daily with a meal., Disp: 120 tablet, Rfl: 3 .  cloNIDine (CATAPRES) 0.2 MG tablet, TAKE ONE (1) TABLET BY MOUTH TWO (2) TIMES DAILY, Disp: 30 tablet, Rfl: 1 .  doxazosin (CARDURA) 8 MG tablet, Take 1 tablet (8 mg total) by mouth 2 (two) times daily., Disp: 60 tablet, Rfl: 6 .  ferrous sulfate (FERROUSUL) 325 (65 FE) MG tablet, Take 1 tablet (325 mg total) by mouth 2 (two) times daily with a meal., Disp: 60 tablet, Rfl: 3 .  hydrALAZINE (APRESOLINE) 100 MG tablet, TAKE ONE TABLET BY MOUTH EVERY EIGHT HOURS (Patient taking differently: Take 100 mg by mouth every 8 (eight) hours. ), Disp: 90 tablet, Rfl: 3 .  insulin aspart (NOVOLOG FLEXPEN) 100 UNIT/ML FlexPen, Inject 12 Units into the skin 3 (three) times daily with meals., Disp: , Rfl:  .  Insulin Glargine (LANTUS SOLOSTAR) 100 UNIT/ML Solostar Pen, Inject 40 Units into the skin at bedtime., Disp: , Rfl:  .  isosorbide mononitrate (IMDUR) 60 MG 24 hr tablet, Take 1 tablet (60 mg total) by mouth daily., Disp: 30 tablet, Rfl: 3 .  levothyroxine (SYNTHROID, LEVOTHROID) 50 MCG tablet, TAKE 1 TABLET BY MOUTH EVERYDAY BEFORE BREAKFAST, Disp: 30 tablet, Rfl: 2 .  pantoprazole (PROTONIX) 40 MG tablet, TAKE ONE (1) TABLET BY MOUTH EVERY DAY, Disp: 30 tablet, Rfl: 1 .  potassium chloride SA (K-DUR,KLOR-CON) 20 MEQ tablet, Take 1 tablet (20 mEq total) by mouth 2 (  two) times daily., Disp: 135 tablet, Rfl: 3 .  torsemide (DEMADEX) 20 MG tablet, Take 3 tablets (60 mg total) by mouth 2 (two) times daily. May take an extra 20 mg Daily for weight gain above 212 lbs, Disp: 190 tablet, Rfl: 3 .  nitroGLYCERIN (NITROSTAT) 0.4 MG SL tablet, Place 1 tablet (0.4 mg total) under the tongue every 5 (five) minutes as needed for chest pain., Disp: 30 tablet, Rfl: 12 .  Tiotropium Bromide Monohydrate (SPIRIVA RESPIMAT) 2.5 MCG/ACT AERS, Inhale 2 puffs into the lungs daily., Disp: 1 Inhaler, Rfl: 0 Allergies  Allergen Reactions  . No Known Allergies      Social History    Social History  . Marital status: Single    Spouse name: N/A  . Number of children: N/A  . Years of education: N/A   Occupational History  . Not on file.   Social History Main Topics  . Smoking status: Former Smoker    Packs/day: 1.00    Years: 46.00    Types: Cigarettes    Start date: 02/04/1967    Quit date: 02/03/2013  . Smokeless tobacco: Never Used     Comment: Doing well with quitting.  . Alcohol use No  . Drug use: No  . Sexual activity: Not Currently   Other Topics Concern  . Not on file   Social History Narrative   Lives with sister, Leonard [name] in [place].  Retired - former Sports coach.    Physical Exam  Eyes: Pupils are equal, round, and reactive to light.  Pulmonary/Chest: No respiratory distress. He has no wheezes. He has no rales.  Abdominal: He exhibits no distension. There is no tenderness. There is no guarding.  Musculoskeletal: He exhibits edema.  Edema present in both legs and feet  Skin: Skin is warm and dry. He is not diaphoretic.        SAFE - 03/18/16 1200      Situation   Admitting diagnosis chf   Heart failure history Exisiting   Comorbidities Anemia;Atrial fibillation;CKD/renal insufficiency;COPD;DM;HTN   Readmitted within 30 days No   Hospital admission within past 12 months Yes   number of hospital admissions 4   number of ED visits 4   Target Weight 212 lbs     Assessment   Lives alone Yes   Primary support person sister: Leonard [name]   Mode of transportation other   Other services involved Other  Home aide (bathe, clean)   [place];Wheelchair     [employer]   Weighs self daily Yes   Scale provided Yes   Records on weight chart No     Resources   Has "Living better w/heart failure" book Yes   Has HF Zone tool Yes   Able to identify yellow zone signs/when to call MD Yes   Records zone daily Yes     Medications   Uses a pill box Yes   Who stocks the pill box --  [facility]   Pill box checked this visit Yes    Pill box refilled this visit Yes   Difficulty obtaining medications No   Mail order medications Yes   New medications at home today No   Next scheduled delivery of medications --  no refills needed   Missed one or more doses of medications per week Yes   How many missed doses this week 6     Nutrition   Patient receives meals on wheels No   Patient follows low sodium  diet No   Has foods at home that meet the current recommended diet No   Patient follows low sugar/card diet No   Nutritional concerns/issues --  pt refuses to eat low sodium diet     Activity Level   ADL's/Mobility Independent   How many feet can patient ambulate --  200   Typical activity level --  normal   Barriers --  none   Actvity tolerance: NYHA Class 3     Urine   Difficulty urinating No   Changes in urine None     Time spent with patient   Time spent with patient  50 Minutes        Future Appointments Date Time Provider [place]  05/22/2016 10:30 AM [facility] LAB 4 [facility] None  05/22/2016 11:15 AM Heath Lark, MD [facility] None  05/22/2016 11:45 AM [facility] INJ NURSE [facility] None  05/27/2016 11:00 AM [facility] PA/NP [facility] None  06/02/2016 10:00 AM Hayden Pedro, MD [facility] None  06/13/2016 11:00 AM [facility] VASC 3 [facility]  06/13/2016 11:45 AM Sharmon Leyden Nickel, NP [facility]  06/16/2016 1:00 PM Hayden Pedro, MD [facility] None  07/23/2016 11:15 AM Gardiner Barefoot, DPM [facility]   ATF pt CAO x4 with no complaints today.  Pt stated that he fell asleep last night before taking lantus therefore his "sugar maybe high".  Pt stated that the nurse from the company that monitors his scale called him this morning and notified him that he has gained 7lbs since last week.  Pt stated that he ate greens, ox tails, pinto beans over this weekend.  Pt did gain 6 lbs since 05/06/16. Pt denies sob, difficulty breathing, chest pain, dizziness and headache. Pt missed one evening dose of  his medications on Friday.  I advised heart clinic of pt's weight increase. rx bottles and pill box verified.    Nira Conn advised that pt take one extra 20 mg torsemide today and f/u with pt tomorrow.     **Called in: Clonidine, ferrous  BP (!) 142/60 (BP Location: Right Arm, Patient Position: Sitting, Cuff Size: Normal)   Pulse 66   Resp 16   Wt 215 lb (97.5 kg)   SpO2 98%   BMI 32.69 kg/m  CBG 382  Weight yesterday-213 Last visit weight-209    ACTION: Home visit completed Next visit planned for next tuesday

## 2016-05-13 NOTE — Telephone Encounter (Signed)
Dee called to let us know pt's wt is up.  She states wt usually runs 209-210 lbs and last week he was at 209 lb and vest reading was 31, today his wt is up to 215 lb (up 6 lbs), she does not have the vest with her to do a reading today.  She states he does not have sob, +LE edema, + abd tightness.  She states pt did eat high salt foods over the weekend and missed his Fri pm dose of Torsemide.  He is taking Torsemide 60 mg BID, advised for pt to have extra 20 mg today, as previously ordered, she will advise pt and f/u with him tomorrow.

## 2016-05-15 NOTE — Telephone Encounter (Signed)
Spoke with patient about an appointment regarding weight gain.  Dr. Juanito Doom wanted patient to be seen.  Per patient wt today was 213 lb, yesterday 05/14/16 wt 2/11 lb.  Patient offered an appt for tomorrow 05/16/16 but unable to make it.  Appointment scheduled for Tuesday 05/20/16 at 2:30 PM with Dr. Juleen China.  Dr. Juanito Doom did not have any slot available until May.  Will forward to PCP.  Derl Barrow, RN

## 2016-05-20 ENCOUNTER — Other Ambulatory Visit (HOSPITAL_COMMUNITY): Payer: Self-pay

## 2016-05-20 ENCOUNTER — Ambulatory Visit (INDEPENDENT_AMBULATORY_CARE_PROVIDER_SITE_OTHER): Payer: Medicaid Other | Admitting: Family Medicine

## 2016-05-20 ENCOUNTER — Encounter: Payer: Self-pay | Admitting: Family Medicine

## 2016-05-20 ENCOUNTER — Other Ambulatory Visit (HOSPITAL_COMMUNITY): Payer: Self-pay | Admitting: Student

## 2016-05-20 ENCOUNTER — Ambulatory Visit: Payer: Medicaid Other | Admitting: Internal Medicine

## 2016-05-20 VITALS — BP 140/62 | HR 63 | Temp 97.8°F | Ht 68.0 in | Wt 214.4 lb

## 2016-05-20 DIAGNOSIS — I5033 Acute on chronic diastolic (congestive) heart failure: Secondary | ICD-10-CM

## 2016-05-20 DIAGNOSIS — Z794 Long term (current) use of insulin: Secondary | ICD-10-CM | POA: Diagnosis not present

## 2016-05-20 DIAGNOSIS — N183 Chronic kidney disease, stage 3 (moderate): Secondary | ICD-10-CM | POA: Diagnosis not present

## 2016-05-20 DIAGNOSIS — E1122 Type 2 diabetes mellitus with diabetic chronic kidney disease: Secondary | ICD-10-CM | POA: Diagnosis present

## 2016-05-20 MED ORDER — TORSEMIDE 20 MG PO TABS: 60.0000 mg | ORAL_TABLET | Freq: Two times a day (BID) | ORAL | 3 refills | Status: DC

## 2016-05-20 MED ORDER — INSULIN GLARGINE 100 UNIT/ML SOLOSTAR PEN: 40.0000 [IU] | PEN_INJECTOR | Freq: Every day | SUBCUTANEOUS | 5 refills | Status: DC

## 2016-05-20 MED ORDER — FERROUS SULFATE 325 (65 FE) MG PO TABS: 325.0000 mg | ORAL_TABLET | Freq: Two times a day (BID) | ORAL | 3 refills | Status: DC

## 2016-05-20 NOTE — Progress Notes (Signed)
Paramedicine Encounter    Patient ID: Leonard [name], male    DOB: [birth year], 66 y.o.   MRN: [id number]   Patient Care Team: [name], MD as PCP - General (Family Medicine) [name], MD as Consulting Physician (Nephrology)  Patient Active Problem List   Diagnosis Date Noted  . Pleural effusion   . Oxygen desaturation   . Morbid obesity due to excess calories ([place]) 03/17/2016  . Anemia due to stage 4 chronic kidney disease treated with darbepoetin ([place]) 02/28/2016  . Fatigue 01/21/2016  . Diabetic retinopathy ([place]) 11/13/2015  . Dyspnea 08/28/2015  . CHF (congestive heart failure) ([place]) 08/27/2015  . Iron deficiency anemia 08/03/2015  . Claudication ([place])   . Type 2 diabetes mellitus with stage 3 chronic kidney disease, with long-term current use of insulin ([place])   . Secondary hypertension   . Hematuria 06/27/2015  . Skin lesion of face 02/22/2015  . 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 Anemia 01/15/2015  . CKD (chronic kidney disease) stage 4, GFR 15-29 ml/min (HCC) 01/15/2015  . Hypothyroidism 11/22/2014  . Acute on chronic diastolic congestive heart failure ([place]) 07/04/2014  . COPD GOLD III  02/16/2014  . History of tobacco use 08/23/2013  . 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 CAD (coronary artery disease) 04/06/2013  . PVD - hx of Rt SFA PTA and s/p Rt 4-5th toe amp Dec 2014 04/05/2013  . Essential hypertension 11/25/2012  . Mixed hyperlipidemia 07/17/2008    Current Outpatient Prescriptions:  .  amLODipine (NORVASC) 10 MG tablet, Take 1 tablet (10 mg total) by mouth daily., Disp: 30 tablet, Rfl: 6 .  aspirin 81 MG tablet, TAKE 81 MG BY MOUTH EVERY DAY, Disp: 30 tablet, Rfl: 3 .  atorvastatin (LIPITOR) 40 MG tablet, TAKE 1 TABLET BY MOUTH EVERY DAY AT 6 PM (Patient taking differently: Take 40 mg by mouth daily. ), Disp: 90 tablet, Rfl: 3 .  carvedilol (COREG) 25 MG tablet, Take 2 tablets (50 mg total) by mouth 2 (two) times  daily with a meal., Disp: 120 tablet, Rfl: 3 .  cloNIDine (CATAPRES) 0.2 MG tablet, TAKE ONE (1) TABLET BY MOUTH TWO (2) TIMES DAILY, Disp: 60 tablet, Rfl: 0 .  doxazosin (CARDURA) 8 MG tablet, Take 1 tablet (8 mg total) by mouth 2 (two) times daily., Disp: 60 tablet, Rfl: 6 .  ferrous sulfate (FERROUSUL) 325 (65 FE) MG tablet, Take 1 tablet (325 mg total) by mouth 2 (two) times daily with a meal., Disp: 60 tablet, Rfl: 3 .  hydrALAZINE (APRESOLINE) 100 MG tablet, TAKE ONE TABLET BY MOUTH EVERY EIGHT HOURS (Patient taking differently: Take 100 mg by mouth every 8 (eight) hours. ), Disp: 90 tablet, Rfl: 3 .  insulin aspart (NOVOLOG FLEXPEN) 100 UNIT/ML FlexPen, Inject 12 Units into the skin 3 (three) times daily with meals., Disp: , Rfl:  .  Insulin Glargine (LANTUS SOLOSTAR) 100 UNIT/ML Solostar Pen, Inject 40 Units into the skin at bedtime., Disp: , Rfl:  .  isosorbide mononitrate (IMDUR) 60 MG 24 hr tablet, Take 1 tablet (60 mg total) by mouth daily., Disp: 30 tablet, Rfl: 3 .  levothyroxine (SYNTHROID, LEVOTHROID) 50 MCG tablet, TAKE 1 TABLET BY MOUTH EVERYDAY BEFORE BREAKFAST, Disp: 30 tablet, Rfl: 2 .  nitroGLYCERIN (NITROSTAT) 0.4 MG SL tablet, Place 1 tablet (0.4 mg total) under the tongue every 5 (five) minutes as needed for chest pain., Disp: 30 tablet, Rfl: 12 .  pantoprazole (PROTONIX) 40 MG tablet,  TAKE ONE (1) TABLET BY MOUTH EVERY DAY, Disp: 30 tablet, Rfl: 1 .  potassium chloride SA (K-DUR,KLOR-CON) 20 MEQ tablet, Take 1 tablet (20 mEq total) by mouth 2 (two) times daily., Disp: 135 tablet, Rfl: 3 .  Tiotropium Bromide Monohydrate (SPIRIVA RESPIMAT) 2.5 MCG/ACT AERS, Inhale 2 puffs into the lungs daily., Disp: 1 Inhaler, Rfl: 0 .  torsemide (DEMADEX) 20 MG tablet, Take 3 tablets (60 mg total) by mouth 2 (two) times daily. May take an extra 20 mg Daily for weight gain above 212 lbs, Disp: 190 tablet, Rfl: 3 Allergies  Allergen Reactions  . No Known Allergies      Social History    Social History  . Marital status: Single    Spouse name: N/A  . Number of children: N/A  . Years of education: N/A   Occupational History  . Not on file.   Social History Main Topics  . Smoking status: Former Smoker    Packs/day: 1.00    Years: 46.00    Types: Cigarettes    Start date: 02/04/1967    Quit date: 02/03/2013  . Smokeless tobacco: Never Used     Comment: Doing well with quitting.  . Alcohol use No  . Drug use: No  . Sexual activity: Not Currently   Other Topics Concern  . Not on file   Social History Narrative   Lives with sister, [name] in [place].  Retired - former Sports coach.    Physical Exam  Pulmonary/Chest: No respiratory distress. He has no wheezes. He has no rales.  Abdominal: He exhibits no distension. There is no tenderness. There is no guarding.  Musculoskeletal: He exhibits edema.  Skin: Skin is warm and dry. He is not diaphoretic.        Future Appointments Date Time Provider [place]  05/20/2016 3:00 PM Eloise Levels, MD [facility]  05/22/2016 10:30 AM [facility] LAB 4 [facility] None  05/22/2016 11:15 AM Heath Lark, MD [facility] None  05/22/2016 11:45 AM [facility] INJ NURSE [facility] None  05/27/2016 11:00 AM [facility] PA/NP [facility] None  06/02/2016 10:00 AM Hayden Pedro, MD [facility] None  06/13/2016 11:00 AM [facility] VASC 3 [facility] V[facility]  06/13/2016 11:45 AM Sharmon Leyden Nickel, NP [facility]  06/16/2016 1:00 PM Hayden Pedro, MD [facility] None  07/23/2016 11:15 AM Gardiner Barefoot, DPM [facility]   ATF pt CAO x4 with no complaints today.  Pt missed one dose of medications on sat evening.  Pt denies sob, dizziness, headaches, and chest pain.  Pt stated that he "may have for gotten to take his insulin dose last night prior to going to bed".  This is pt's second time reporting this to [facility].  I suggested that pt set an alarm reminder for insulin.  Pt has also misplaced his glucometer and hasn't checked his CBG.  I  advised pt to let his primary physician know the same today during his visit.  rx bottles verified and pill box filled.    BP (!) 160/80 (BP Location: Right Arm, Patient Position: Sitting, Cuff Size: Normal)   Pulse 66   Resp 20   Wt 210 lb (95.3 kg)   SpO2 98%   BMI 31.93 kg/m   cbg 455  *No torsemide in next tues slot; give to pt during visit *called in: Hydralazine  Torsemide Potassium Doxazosin amlodipine  Weight yesterday-210 Last visit weight-215  Mickeal Daws, EMT Paramedic 05/20/2016  ACTION: Home visit completed Next visit planned for  friday to refill torsemide

## 2016-05-20 NOTE — Patient Instructions (Signed)
Thanks for coming in to clinic today.  I refilled some of your medication.  Continue taking your medication as prescribed and limit your fluids to less than 2 liters and eat low salt diet.    Very nice seeing you day, Leonard [name] L. Rosalyn Gess, [place] Resident PGY-1 05/20/2016 4:29 PM

## 2016-05-21 NOTE — Assessment & Plan Note (Signed)
Historically patient's dry weight has been around 218 and reported that his weight at home today was 210 in the office was 214. Feels that his lower extremity edema as well as baseline and has no crackles on physical exam and reports no exertional dyspnea.  I do not believe the patient is currently having an acute exacerbation of his heart failure.   - Recommend continuing torsemide 60 mg twice a day - Continue follow-up with specialists - Less than 2 g sodium daily - Less than 2 L fluid intake daily - Daily weights

## 2016-05-21 NOTE — Progress Notes (Signed)
    Subjective:  Leonard [name] is a 66 y.o. male who presents to the [facility] today for medication refill and weight check  HPI:  Chronic diastolic heart failure: Patient follows closely with heart failure team and had a home visit today with paramedic. Last week patient's weight had apparently increased and were recommending to be seen in the office for heart failure management and possible adjustment of medication.  Patient's weight at home was reportedly to 10 and here the office was 214.  Denies any shortness of breath, at rest, with exertion or when supine.  Denies any increase in baseline lower extremity edema.   PMH: End-stage renal disease, diastolic heart failure Tobacco use: Quit in 2015 Medication: reviewed and updated ROS: see HPI   Objective:  Physical Exam: BP 140/62   Pulse 63   Temp 97.8 F (36.6 C) (Oral)   Ht 5\' 8"  (1.727 m)   Wt 97.3 kg (214 lb 6.4 oz)   SpO2 98%   BMI 32.60 kg/m   Gen: 66yo M in NAD, resting comfortably CV: RRR with no murmurs appreciated Pulm: NWOB, CTAB with no crackles, wheezes, or rhonchi GI: Normal bowel sounds present. Soft, Nontender, Nondistended. MSK: no edema, cyanosis, or clubbing noted Skin: warm, dry Extremities: Warm and well-perfused, 2+ pitting edema Neuro: grossly normal, moves all extremities Psych: Normal affect and thought content  No results found for this or any previous visit (from the past 72 hour(s)).   Assessment/Plan:  Acute on chronic diastolic congestive heart failure ([place]) Historically patient's dry weight has been around 218 and reported that his weight at home today was 210 in the office was 214. Feels that his lower extremity edema as well as baseline and has no crackles on physical exam and reports no exertional dyspnea.  I do not believe the patient is currently having an acute exacerbation of his heart failure.   - Recommend continuing torsemide 60 mg twice a day - Continue follow-up with specialists -  Less than 2 g sodium daily - Less than 2 L fluid intake daily - Daily weights

## 2016-05-22 ENCOUNTER — Ambulatory Visit: Payer: Medicaid Other

## 2016-05-22 ENCOUNTER — Encounter: Payer: Self-pay | Admitting: Hematology and Oncology

## 2016-05-22 ENCOUNTER — Ambulatory Visit (HOSPITAL_BASED_OUTPATIENT_CLINIC_OR_DEPARTMENT_OTHER): Payer: Medicaid Other | Admitting: Hematology and Oncology

## 2016-05-22 ENCOUNTER — Other Ambulatory Visit (HOSPITAL_BASED_OUTPATIENT_CLINIC_OR_DEPARTMENT_OTHER): Payer: Medicaid Other

## 2016-05-22 ENCOUNTER — Encounter (HOSPITAL_BASED_OUTPATIENT_CLINIC_OR_DEPARTMENT_OTHER): Payer: Medicaid Other

## 2016-05-22 DIAGNOSIS — N184 Chronic kidney disease, stage 4 (severe): Secondary | ICD-10-CM | POA: Diagnosis not present

## 2016-05-22 DIAGNOSIS — D472 Monoclonal gammopathy: Secondary | ICD-10-CM

## 2016-05-22 DIAGNOSIS — N189 Chronic kidney disease, unspecified: Principal | ICD-10-CM

## 2016-05-22 DIAGNOSIS — D631 Anemia in chronic kidney disease: Secondary | ICD-10-CM

## 2016-05-22 LAB — CBC WITH DIFFERENTIAL/PLATELET
BASO%: 0.3 % (ref 0.0–2.0)
BASOS ABS: 0 10*3/uL (ref 0.0–0.1)
EOS%: 3.1 % (ref 0.0–7.0)
Eosinophils Absolute: 0.2 10*3/uL (ref 0.0–0.5)
HCT: 29.2 % — ABNORMAL LOW (ref 38.4–49.9)
HGB: 9 g/dL — ABNORMAL LOW (ref 13.0–17.1)
LYMPH%: 21.1 % (ref 14.0–49.0)
MCH: 24.3 pg — AB (ref 27.2–33.4)
MCHC: 30.8 g/dL — ABNORMAL LOW (ref 32.0–36.0)
MCV: 78.7 fL — ABNORMAL LOW (ref 79.3–98.0)
MONO#: 0.5 10*3/uL (ref 0.1–0.9)
MONO%: 9.1 % (ref 0.0–14.0)
NEUT#: 3.9 10*3/uL (ref 1.5–6.5)
NEUT%: 66.4 % (ref 39.0–75.0)
PLATELETS: 196 10*3/uL (ref 140–400)
RBC: 3.71 10*6/uL — ABNORMAL LOW (ref 4.20–5.82)
RDW: 18.8 % — AB (ref 11.0–14.6)
WBC: 5.8 10*3/uL (ref 4.0–10.3)
lymph#: 1.2 10*3/uL (ref 0.9–3.3)

## 2016-05-22 MED ORDER — DARBEPOETIN ALFA 500 MCG/ML IJ SOSY
500.0000 ug | PREFILLED_SYRINGE | Freq: Once | INTRAMUSCULAR | Status: AC
  Administered 2016-05-22: 500 ug via SUBCUTANEOUS
  Filled 2016-05-22: qty 1

## 2016-05-22 NOTE — Patient Instructions (Signed)
Darbepoetin Alfa injection What is this medicine? DARBEPOETIN ALFA (dar be POE e tin AL fa) helps your body make more red blood cells. It is used to treat anemia caused by chronic kidney failure and chemotherapy. This medicine may be used for other purposes; ask your health care provider or pharmacist if you have questions. What should I tell my health care provider before I take this medicine? They need to know if you have any of these conditions: -blood clotting disorders or history of blood clots -cancer patient not on chemotherapy -cystic fibrosis -heart disease, such as angina, heart failure, or a history of a heart attack -hemoglobin level of 12 g/dL or greater -high blood pressure -low levels of folate, iron, or vitamin B12 -seizures -an unusual or allergic reaction to darbepoetin, erythropoietin, albumin, hamster proteins, latex, other medicines, foods, dyes, or preservatives -pregnant or trying to get pregnant -breast-feeding How should I use this medicine? This medicine is for injection into a vein or under the skin. It is usually given by a health care professional in a hospital or clinic setting. If you get this medicine at home, you will be taught how to prepare and give this medicine. Do not shake the solution before you withdraw a dose. Use exactly as directed. Take your medicine at regular intervals. Do not take your medicine more often than directed. It is important that you put your used needles and syringes in a special sharps container. Do not put them in a trash can. If you do not have a sharps container, call your pharmacist or healthcare provider to get one. Talk to your pediatrician regarding the use of this medicine in children. While this medicine may be used in children as young as 1 year for selected conditions, precautions do apply. Overdosage: If you think you have taken too much of this medicine contact a poison control center or emergency room at once. NOTE:  This medicine is only for you. Do not share this medicine with others. What if I miss a dose? If you miss a dose, take it as soon as you can. If it is almost time for your next dose, take only that dose. Do not take double or extra doses. What may interact with this medicine? Do not take this medicine with any of the following medications: -epoetin alfa This list may not describe all possible interactions. Give your health care provider a list of all the medicines, herbs, non-prescription drugs, or dietary supplements you use. Also tell them if you smoke, drink alcohol, or use illegal drugs. Some items may interact with your medicine. What should I watch for while using this medicine? Visit your prescriber or health care professional for regular checks on your progress and for the needed blood tests and blood pressure measurements. It is especially important for the doctor to make sure your hemoglobin level is in the desired range, to limit the risk of potential side effects and to give you the best benefit. Keep all appointments for any recommended tests. Check your blood pressure as directed. Ask your doctor what your blood pressure should be and when you should contact him or her. As your body makes more red blood cells, you may need to take iron, folic acid, or vitamin B supplements. Ask your doctor or health care provider which products are right for you. If you have kidney disease continue dietary restrictions, even though this medication can make you feel better. Talk with your doctor or health care professional about the   foods you eat and the vitamins that you take. What side effects may I notice from receiving this medicine? Side effects that you should report to your doctor or health care professional as soon as possible: -allergic reactions like skin rash, itching or hives, swelling of the face, lips, or tongue -breathing problems -changes in vision -chest pain -confusion, trouble speaking  or understanding -feeling faint or lightheaded, falls -high blood pressure -muscle aches or pains -pain, swelling, warmth in the leg -rapid weight gain -severe headaches -sudden numbness or weakness of the face, arm or leg -trouble walking, dizziness, loss of balance or coordination -seizures (convulsions) -swelling of the ankles, feet, hands -unusually weak or tired Side effects that usually do not require medical attention (report to your doctor or health care professional if they continue or are bothersome): -diarrhea -fever, chills (flu-like symptoms) -headaches -nausea, vomiting -redness, stinging, or swelling at site where injected This list may not describe all possible side effects. Call your doctor for medical advice about side effects. You may report side effects to FDA at 1-800-FDA-1088. Where should I keep my medicine? Keep out of the reach of children. Store in a refrigerator between 2 and 8 degrees C (36 and 46 degrees F). Do not freeze. Do not shake. Throw away any unused portion if using a single-dose vial. Throw away any unused medicine after the expiration date. NOTE: This sheet is a summary. It may not cover all possible information. If you have questions about this medicine, talk to your doctor, pharmacist, or health care provider.    2016, [employer]. (2008-01-04 10:23:57)  

## 2016-05-22 NOTE — Assessment & Plan Note (Signed)
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 He had responded well in the past after iron infusion. I will proceed with darbepoetin today and will continue to check iron studies periodically We will continue darbepoetin to keep hemoglobin greater than 10

## 2016-05-22 NOTE — Progress Notes (Signed)
[facility] OFFICE PROGRESS NOTE  Patient Care Team: [name], MD as PCP - General (Family Medicine) [name], MD as Consulting Physician (Nephrology)  SUMMARY OF ONCOLOGIC HISTORY:  Leonard [name] is here because of progressive anemia, renal failure and MGUS. I reviewed reports from his nephrologist office. The patient have significant peripheral vascular disease, diabetes and hypertension. Early this year, he had baseline creatinine of around 1.3 and subsequently developed heart failure. He was placed on aggressive medication/regimen and subsequently was noted to have progressive renal failure and anemia. According to his blood work dated 01/03/2015, white blood cell count is at 6.6, hemoglobin 7.1, platelet count of 153, creatinine of 2.4, calcium of 8.9, MCV 76, iron saturation 15%, ferritin 180, serum vitamin B-12 of 360. Serum protein electrophoresis initially detected M spike but subsequently not quantifiable. Urine immunofixation showed Bence-Jones Proteinuria. Echocardiogram show preserved ejection fraction 55-60% but with evidence of diastolic heart failure. He denies history of abnormal bone pain or bone fracture. Patient denies history of recurrent infection or atypical infections such as shingles of meningitis. Denies chills, night sweats, anorexia or abnormal weight loss. He complained of fatigue and shortness of breath on minimal exertion. The patient denies any recent signs or symptoms of bleeding such as spontaneous hematuria or hematochezia. He has occasional epistaxis when he blows his nose. He does not have spontaneous epistaxis Evaluation in December 2016 show he had anemia chronic disease and MGUS  On 02/01/2015, he is started on Aranesp, 200 g every 2 weeks to keep hemoglobin greater than 11 Between December to June 2017, the dose of Aranesp is titrated to maximum 500 g In February 2018, he received 1 dose of intravenous  iron  INTERVAL HISTORY: Please see below for problem oriented charting.  He feels well.   He complained of mild fatigue The patient denies any recent signs or symptoms of bleeding such as spontaneous epistaxis, hematuria or hematochezia. He denies recent infection No new bone pain   REVIEW OF SYSTEMS:   Constitutional: Denies fevers, chills or abnormal weight loss Eyes: Denies blurriness of vision Ears, nose, mouth, throat, and face: Denies mucositis or sore throat Respiratory: Denies cough, dyspnea or wheezes Cardiovascular: Denies palpitation, chest discomfort or lower extremity swelling Gastrointestinal:  Denies nausea, heartburn or change in bowel habits Skin: Denies abnormal skin rashes Lymphatics: Denies new lymphadenopathy or easy bruising Neurological:Denies numbness, tingling or new weaknesses Behavioral/Psych: Mood is stable, no new changes  All other systems were reviewed with the patient and are negative.  I have reviewed the past medical history, past surgical history, social history and family history with the patient and they are unchanged from previous note.  ALLERGIES:  is allergic to no known allergies.  MEDICATIONS:  Current Outpatient Prescriptions  Medication Sig Dispense Refill  . amLODipine (NORVASC) 10 MG tablet Take 1 tablet (10 mg total) by mouth daily. 30 tablet 6  . aspirin 81 MG tablet TAKE 81 MG BY MOUTH EVERY DAY 30 tablet 3  . atorvastatin (LIPITOR) 40 MG tablet TAKE 1 TABLET BY MOUTH EVERY DAY AT 6 PM (Patient taking differently: Take 40 mg by mouth daily. ) 90 tablet 3  . carvedilol (COREG) 25 MG tablet Take 2 tablets (50 mg total) by mouth 2 (two) times daily with a meal. 120 tablet 3  . cloNIDine (CATAPRES) 0.2 MG tablet TAKE ONE (1) TABLET BY MOUTH TWO (2) TIMES DAILY 60 tablet 0  . doxazosin (CARDURA) 8 MG tablet Take 1 tablet (  8 mg total) by mouth 2 (two) times daily. 60 tablet 6  . ferrous sulfate (FERROUSUL) 325 (65 FE) MG tablet Take 1  tablet (325 mg total) by mouth 2 (two) times daily with a meal. 60 tablet 3  . hydrALAZINE (APRESOLINE) 100 MG tablet TAKE ONE TABLET BY MOUTH EVERY EIGHT HOURS (Patient taking differently: Take 100 mg by mouth every 8 (eight) hours. ) 90 tablet 3  . insulin aspart (NOVOLOG FLEXPEN) 100 UNIT/ML FlexPen Inject 12 Units into the skin 3 (three) times daily with meals.    . Insulin Glargine (LANTUS SOLOSTAR) 100 UNIT/ML Solostar Pen Inject 40 Units into the skin at bedtime. 15 mL 5  . isosorbide mononitrate (IMDUR) 60 MG 24 hr tablet Take 1 tablet (60 mg total) by mouth daily. 30 tablet 3  . levothyroxine (SYNTHROID, LEVOTHROID) 50 MCG tablet TAKE 1 TABLET BY MOUTH EVERYDAY BEFORE BREAKFAST 30 tablet 2  . pantoprazole (PROTONIX) 40 MG tablet TAKE ONE (1) TABLET BY MOUTH EVERY DAY 30 tablet 1  . potassium chloride SA (K-DUR,KLOR-CON) 20 MEQ tablet Take 1 tablet (20 mEq total) by mouth 2 (two) times daily. 135 tablet 3  . torsemide (DEMADEX) 20 MG tablet Take 3 tablets (60 mg total) by mouth 2 (two) times daily. May take an extra 20 mg Daily for weight gain above 212 lbs 190 tablet 3  . nitroGLYCERIN (NITROSTAT) 0.4 MG SL tablet Place 1 tablet (0.4 mg total) under the tongue every 5 (five) minutes as needed for chest pain. (Patient not taking: Reported on 05/22/2016) 30 tablet 12   No current facility-administered medications for this visit.     PHYSICAL EXAMINATION: ECOG PERFORMANCE STATUS: 1 - Symptomatic but completely ambulatory  Vitals:   05/22/16 1059  BP: (!) 136/56  Pulse: 68  Resp: 18  Temp: 98.2 F (36.8 C)   Filed Weights   05/22/16 1059  Weight: 215 lb 3.2 oz (97.6 kg)    GENERAL:alert, no distress and comfortable SKIN: skin color, texture, turgor are normal, no rashes or significant lesions EYES: normal, Conjunctiva are pink and non-injected, sclera clear OROPHARYNX:no exudate, no erythema and lips, buccal mucosa, and tongue normal  NECK: supple, thyroid normal size,  non-tender, without nodularity LYMPH:  no palpable lymphadenopathy in the cervical, axillary or inguinal LUNGS: clear to auscultation and percussion with normal breathing effort HEART: regular rate & rhythm and no murmurs and no lower extremity edema ABDOMEN:abdomen soft, non-tender and normal bowel sounds Musculoskeletal:no cyanosis of digits and no clubbing  NEURO: alert & oriented x 3 with fluent speech, no focal motor/sensory deficits  LABORATORY DATA:  I have reviewed the data as listed    Component Value Date/Time   NA 138 05/09/2016 1013   NA 140 08/16/2015 1554   K 4.3 05/09/2016 1013   K 3.8 08/16/2015 1554   CL 101 05/09/2016 1013   CO2 25 05/09/2016 1013   CO2 21 (L) 08/16/2015 1554   GLUCOSE 247 (H) 05/09/2016 1013   GLUCOSE 133 08/16/2015 1554   BUN 62 (H) 05/09/2016 1013   BUN 51.0 (H) 08/16/2015 1554   CREATININE 3.13 (H) 05/09/2016 1013   CREATININE 3.64 (H) 01/09/2016 1606   CREATININE 2.7 (H) 08/16/2015 1554   CALCIUM 8.7 (L) 05/09/2016 1013   CALCIUM NOPLAS 10/24/2015 1415   CALCIUM 8.9 08/16/2015 1554   PROT 7.1 01/13/2016 2316   PROT 7.0 08/16/2015 1554   ALBUMIN 3.6 01/13/2016 2316   ALBUMIN 3.4 (L) 08/16/2015 1554   AST 33 01/13/2016  2316   AST 17 08/16/2015 1554   ALT 20 01/13/2016 2316   ALT 13 08/16/2015 1554   ALKPHOS 102 01/13/2016 2316   ALKPHOS 89 08/16/2015 1554   BILITOT 0.4 01/13/2016 2316   BILITOT 0.30 08/16/2015 1554   GFRNONAA 20 (L) 05/09/2016 1013   GFRNONAA 27 (L) 07/20/2015 0949   GFRAA 23 (L) 05/09/2016 1013   GFRAA 31 (L) 07/20/2015 0949    No results found for: SPEP, UPEP  Lab Results  Component Value Date   WBC 5.8 05/22/2016   NEUTROABS 3.9 05/22/2016   HGB 9.0 (L) 05/22/2016   HCT 29.2 (L) 05/22/2016   MCV 78.7 (L) 05/22/2016   PLT 196 05/22/2016      Chemistry      Component Value Date/Time   NA 138 05/09/2016 1013   NA 140 08/16/2015 1554   K 4.3 05/09/2016 1013   K 3.8 08/16/2015 1554   CL 101  05/09/2016 1013   CO2 25 05/09/2016 1013   CO2 21 (L) 08/16/2015 1554   BUN 62 (H) 05/09/2016 1013   BUN 51.0 (H) 08/16/2015 1554   CREATININE 3.13 (H) 05/09/2016 1013   CREATININE 3.64 (H) 01/09/2016 1606   CREATININE 2.7 (H) 08/16/2015 1554      Component Value Date/Time   CALCIUM 8.7 (L) 05/09/2016 1013   CALCIUM NOPLAS 10/24/2015 1415   CALCIUM 8.9 08/16/2015 1554   ALKPHOS 102 01/13/2016 2316   ALKPHOS 89 08/16/2015 1554   AST 33 01/13/2016 2316   AST 17 08/16/2015 1554   ALT 20 01/13/2016 2316   ALT 13 08/16/2015 1554   BILITOT 0.4 01/13/2016 2316   BILITOT 0.30 08/16/2015 1554       ASSESSMENT & PLAN:  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) The patient has significant fluctuation of his serum creatinine, between chronic kidney disease stage III to stage IV. He had responded well in the past after iron infusion. I will proceed with darbepoetin today and will continue to check iron studies periodically We will continue darbepoetin to keep hemoglobin greater than 10  MGUS (monoclonal gammopathy of unknown significance) The patient was originally referred here because of detectable M spike in his blood which subsequently become undetectable. 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 Extensive workup in the past few months did not take up any abnormal M spike. Although his serum light chains were high, this is likely related to renal disease and the ratio was just barely above the upper limit of normal. Repeat myeloma panel in July again is consistent with the pattern related to chronic renal failure In any case, I do not believe his renal failure and anemia is related to multiple myeloma. I plan to repeat the myeloma panel again in the summer   No orders of the defined types were placed in this encounter.  All questions were answered. The patient knows to call the clinic with any problems, questions or  concerns. No barriers to learning was detected. I spent 10 minutes counseling the patient face to face. The total time spent in the appointment was 15 minutes and more than 50% was on counseling and review of test results     Heath Lark, MD 05/22/2016 4:46 PM

## 2016-05-22 NOTE — Assessment & Plan Note (Signed)
The patient was originally referred here because of detectable M spike in his blood which subsequently become undetectable. 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 Extensive workup in the past few months did not take up any abnormal M spike. Although his serum light chains were high, this is likely related to renal disease and the ratio was just barely above the upper limit of normal. Repeat myeloma panel in July again is consistent with the pattern related to chronic renal failure In any case, I do not believe his renal failure and anemia is related to multiple myeloma. I plan to repeat the myeloma panel again in the summer 

## 2016-05-23 ENCOUNTER — Telehealth: Payer: Self-pay | Admitting: Hematology and Oncology

## 2016-05-23 ENCOUNTER — Other Ambulatory Visit (HOSPITAL_COMMUNITY): Payer: Self-pay

## 2016-05-23 NOTE — Telephone Encounter (Signed)
Appointments scheduled per 4.19.18 LOS. Patient notified.

## 2016-05-26 ENCOUNTER — Other Ambulatory Visit: Payer: Self-pay | Admitting: Family Medicine

## 2016-05-27 ENCOUNTER — Encounter (HOSPITAL_COMMUNITY): Payer: Medicaid Other

## 2016-05-27 ENCOUNTER — Encounter (HOSPITAL_COMMUNITY): Payer: Self-pay

## 2016-05-27 ENCOUNTER — Other Ambulatory Visit: Payer: Self-pay | Admitting: Family Medicine

## 2016-05-27 ENCOUNTER — Other Ambulatory Visit (HOSPITAL_COMMUNITY): Payer: Self-pay

## 2016-05-27 ENCOUNTER — Ambulatory Visit (HOSPITAL_COMMUNITY)
Admission: RE | Admit: 2016-05-27 | Discharge: 2016-05-27 | Disposition: A | Discharge status: Home / Self Care | Payer: Medicaid Other | Source: Ambulatory Visit | Attending: Cardiology | Admitting: Cardiology

## 2016-05-27 VITALS — BP 186/70 | HR 78 | Wt 212.1 lb

## 2016-05-27 DIAGNOSIS — Z87891 Personal history of nicotine dependence: Secondary | ICD-10-CM | POA: Diagnosis not present

## 2016-05-27 DIAGNOSIS — D472 Monoclonal gammopathy: Secondary | ICD-10-CM | POA: Diagnosis not present

## 2016-05-27 DIAGNOSIS — E039 Hypothyroidism, unspecified: Secondary | ICD-10-CM | POA: Diagnosis not present

## 2016-05-27 DIAGNOSIS — E1151 Type 2 diabetes mellitus with diabetic peripheral angiopathy without gangrene: Secondary | ICD-10-CM | POA: Insufficient documentation

## 2016-05-27 DIAGNOSIS — Z7982 Long term (current) use of aspirin: Secondary | ICD-10-CM | POA: Diagnosis not present

## 2016-05-27 DIAGNOSIS — Z951 Presence of aortocoronary bypass graft: Secondary | ICD-10-CM | POA: Insufficient documentation

## 2016-05-27 DIAGNOSIS — G4733 Obstructive sleep apnea (adult) (pediatric): Secondary | ICD-10-CM | POA: Diagnosis not present

## 2016-05-27 DIAGNOSIS — I132 Hypertensive heart and chronic kidney disease with heart failure and with stage 5 chronic kidney disease, or end stage renal disease: Secondary | ICD-10-CM | POA: Insufficient documentation

## 2016-05-27 DIAGNOSIS — N185 Chronic kidney disease, stage 5: Secondary | ICD-10-CM | POA: Insufficient documentation

## 2016-05-27 DIAGNOSIS — Z8249 Family history of ischemic heart disease and other diseases of the circulatory system: Secondary | ICD-10-CM | POA: Diagnosis not present

## 2016-05-27 DIAGNOSIS — I10 Essential (primary) hypertension: Secondary | ICD-10-CM

## 2016-05-27 DIAGNOSIS — I5032 Chronic diastolic (congestive) heart failure: Secondary | ICD-10-CM

## 2016-05-27 DIAGNOSIS — Z79899 Other long term (current) drug therapy: Secondary | ICD-10-CM | POA: Insufficient documentation

## 2016-05-27 DIAGNOSIS — E1122 Type 2 diabetes mellitus with diabetic chronic kidney disease: Secondary | ICD-10-CM | POA: Diagnosis not present

## 2016-05-27 DIAGNOSIS — E785 Hyperlipidemia, unspecified: Secondary | ICD-10-CM | POA: Diagnosis not present

## 2016-05-27 DIAGNOSIS — Z9981 Dependence on supplemental oxygen: Secondary | ICD-10-CM | POA: Insufficient documentation

## 2016-05-27 DIAGNOSIS — J449 Chronic obstructive pulmonary disease, unspecified: Secondary | ICD-10-CM | POA: Insufficient documentation

## 2016-05-27 DIAGNOSIS — I2510 Atherosclerotic heart disease of native coronary artery without angina pectoris: Secondary | ICD-10-CM

## 2016-05-27 DIAGNOSIS — Z801 Family history of malignant neoplasm of trachea, bronchus and lung: Secondary | ICD-10-CM | POA: Insufficient documentation

## 2016-05-27 DIAGNOSIS — Z794 Long term (current) use of insulin: Secondary | ICD-10-CM | POA: Insufficient documentation

## 2016-05-27 DIAGNOSIS — D631 Anemia in chronic kidney disease: Secondary | ICD-10-CM | POA: Insufficient documentation

## 2016-05-27 NOTE — Progress Notes (Signed)
PCP: Dr. Juanito Doom Cardiology: Dr. Aundra Dubin Nephrology: Dr Marval Regal.   66 yo with history of CAD s/p CABG, PAD, chronic diastolic CHF, CKD stage IV, COPD on home oxygen, and HTN presents for cardiology followup.  He has had multiple admissions this year for diastolic CHF.  Last echo in 5/17 showed EF 60-65% with grade II diastolic dysfunction.  He also has COPD with PFTs showing severe obstruction.    Today he returns for follow up. Last visit torsemide was increased to 60 mg twice a day. Says he did not take medications this morning or last night. Overall feeling ok. SOB after walking for a while. Says he has to walk slowly. Appetite ok. Denies fever or chills. No CP  Weight at home 206-208 pounds. Followed by [facility].   Labs (8/17): K 3.9, creatinine 2.33, HCT 28.5, LDL 46 Labs (11/17): hgb 9, K 4.4, creatinine 3, BNP 64 Labs 03/24/2016: K 3.5 Creatinine 2.48.  Labs 05/09/2016: K 4.3 Creatinine 3.13 Hgb 9.0   PMH: 1. Chronic diastolic CHF: Multiple recent admissions.  Echo (5/17) with EF 60-65%, grade II diastolic dysfunction, normal RV size and systolic function.  2. PAD: Right femoral PCI in 12/14. Peripheral arterial dopplers in 6/17 with occluded right SFA.  He follows with [facility].  3. HTN: Renal artery dopplers negative in 5/17.  4. Hypothyroidism 5. CKD stage IV: Follows with Dr. Marval Regal. 6. CAD: s/p CABG x 3 in 3/15.  7. OSA: Mild, not on CPAP.  8. COPD: PFTs (3/15) with FVC 77%, FEV1 47%, ratio 59%, TLC 55% => severe obstruction.  Prior smoker. He is on 2 L home O2.  9. 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 Hyperlipidemia 12. Anemia of renal disease   Social History   Social History  . Marital status: Single    Spouse name: N/A  . Number of children: N/A  . Years of education: N/A   Occupational History  . Not on file.   Social History Main Topics  . Smoking status: Former Smoker    Packs/day: 1.00    Years: 46.00    Types: Cigarettes    Start date: 02/04/1967    Quit  date: 02/03/2013  . Smokeless tobacco: Never Used     Comment: Doing well with quitting.  . Alcohol use No  . Drug use: No  . Sexual activity: Not Currently   Other Topics Concern  . Not on file   Social History Narrative   Lives with sister, [name] in [place].  Retired - former Sports coach.   Family History  Problem Relation Age of Onset  . Cancer Mother     lung cancer  . Hypertension Mother   . Cancer Sister     patient thinks it was uterine cancer  . Hypertension Sister   . Heart attack Sister   . Stroke Neg Hx    ROS: All systems reviewed and negative except as per HPI.   Current Outpatient Prescriptions  Medication Sig Dispense Refill  . amLODipine (NORVASC) 10 MG tablet Take 1 tablet (10 mg total) by mouth daily. 30 tablet 6  . aspirin 81 MG tablet TAKE 81 MG BY MOUTH EVERY DAY 30 tablet 3  . atorvastatin (LIPITOR) 40 MG tablet TAKE 1 TABLET BY MOUTH EVERY DAY AT 6 PM (Patient taking differently: Take 40 mg by mouth daily. ) 90 tablet 3  . carvedilol (COREG) 25 MG tablet Take 2 tablets (50 mg total) by mouth 2 (two) times daily with a meal.  120 tablet 3  . cloNIDine (CATAPRES) 0.2 MG tablet TAKE ONE (1) TABLET BY MOUTH TWO (2) TIMES DAILY 60 tablet 0  . doxazosin (CARDURA) 8 MG tablet Take 1 tablet (8 mg total) by mouth 2 (two) times daily. 60 tablet 6  . ferrous sulfate (FERROUSUL) 325 (65 FE) MG tablet Take 1 tablet (325 mg total) by mouth 2 (two) times daily with a meal. 60 tablet 3  . hydrALAZINE (APRESOLINE) 100 MG tablet TAKE ONE TABLET BY MOUTH EVERY EIGHT HOURS (Patient taking differently: Take 100 mg by mouth every 8 (eight) hours. ) 90 tablet 3  . insulin aspart (NOVOLOG FLEXPEN) 100 UNIT/ML FlexPen Inject 12 Units into the skin 3 (three) times daily with meals.    . Insulin Glargine (LANTUS SOLOSTAR) 100 UNIT/ML Solostar Pen Inject 40 Units into the skin at bedtime. 15 mL 5  . isosorbide mononitrate (IMDUR) 60 MG 24 hr tablet Take 1 tablet (60 mg total)  by mouth daily. 30 tablet 3  . levothyroxine (SYNTHROID, LEVOTHROID) 50 MCG tablet TAKE 1 TABLET BY MOUTH EVERYDAY BEFORE BREAKFAST 30 tablet 2  . nitroGLYCERIN (NITROSTAT) 0.4 MG SL tablet Place 1 tablet (0.4 mg total) under the tongue every 5 (five) minutes as needed for chest pain. 30 tablet 12  . pantoprazole (PROTONIX) 40 MG tablet TAKE ONE (1) TABLET BY MOUTH EVERY DAY 30 tablet 1  . potassium chloride SA (K-DUR,KLOR-CON) 20 MEQ tablet Take 1 tablet (20 mEq total) by mouth 2 (two) times daily. 135 tablet 3  . torsemide (DEMADEX) 20 MG tablet Take 3 tablets (60 mg total) by mouth 2 (two) times daily. May take an extra 20 mg Daily for weight gain above 212 lbs 190 tablet 3   No current facility-administered medications for this encounter.      BP (!) 186/70 (BP Location: Right Arm, Patient Position: Sitting, Cuff Size: Normal) Comment: Has not taken any am meds today  Pulse 78   Wt 212 lb 2 oz (96.2 kg)   SpO2 98%   BMI 32.25 kg/m    Filed Weights   05/27/16 0810  Weight: 212 lb 2 oz (96.2 kg)   General:  Well appearing. No resp difficulty. Sister present.  HEENT: normal Neck: supple. JVP 10-11. Carotids 2+ bilat; no bruits. No lymphadenopathy or thryomegaly appreciated. Cor: PMI nondisplaced. Regular rate & rhythm. No rubs, gallops or murmurs. Lungs: clear Abdomen: soft, nontender, nondistended. No hepatosplenomegaly. No bruits or masses. Good bowel sounds. Extremities: no cyanosis, clubbing, rash, L>R edema 2>1 + edema. LUE AVF Neuro: alert & orientedx3, cranial nerves grossly intact. moves all 4 extremities w/o difficulty. Affect pleasant   Assessment/Plan: 1. Chronic diastolic CHF:  This is worsened by stage CKD.   NYHA III. Volume status elevated in the setting of missed medications. I have contacted [facility] with paramedicine to address meds.  No change.  2. HTN: Elevated but hasnt had meds. Continue current regimen.    3. CAD: S/p CABG.  No CP. Continue asa, statin.    4.  COPD: Severe by 3/15 PFTs.  He has quit smoking.  He is not using oxygen. Followed by Dr Melvyn Novas.  5. CKD: Stage V.-  L AVF. Sees Dr Marval Regal.  6. PAD: Not particularly limited by claudication now.  Saw [facility] 6/17, has followup.   We need to get a better handle on medication regimen. Continue paramedicine.  Follow up in 2 weeks for BP check and 3 months visit.   Gaila Engebretsen NP-C  05/27/2016

## 2016-05-27 NOTE — Patient Instructions (Signed)
No changes to medication at this time. Will have [facility] come to your home to arrange pill box.  No lab work today.  Return in 2 weeks for BP check.  _________________________________________________  _________________________________________________  Follow up 3 months with Amy Clegg NP-C.  _________________________________________________  _________________________________________________  Do the following things EVERYDAY: 1) Weigh yourself in the morning before breakfast. Write it down and keep it in a log. 2) Take your medicines as prescribed 3) Eat low salt foods-Limit salt (sodium) to 2000 mg per day.  4) Stay as active as you can everyday 5) Limit all fluids for the day to less than 2 liters

## 2016-05-27 NOTE — Progress Notes (Signed)
Paramedicine Encounter    Patient ID: Leonard [name], male    DOB: 12/06/1950, 65 y.o.   MRN: [id number]   Patient Care Team: [name], MD as PCP - General (Family Medicine) [name], MD as Consulting Physician (Nephrology)  Patient Active Problem List   Diagnosis Date Noted  . Pleural effusion   . Oxygen desaturation   . Morbid obesity due to excess calories ([place]) 03/17/2016  . Anemia due to stage 4 chronic kidney disease treated with darbepoetin ([place]) 02/28/2016  . Fatigue 01/21/2016  . Diabetic retinopathy ([place]) 11/13/2015  . Dyspnea 08/28/2015  . CHF (congestive heart failure) ([place]) 08/27/2015  . Iron deficiency anemia 08/03/2015  . Claudication ([place])   . Type 2 diabetes mellitus with stage 3 chronic kidney disease, with long-term current use of insulin ([place])   . Secondary hypertension   . Hematuria 06/27/2015  . Skin lesion of face 02/22/2015  . 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 Anemia 01/15/2015  . CKD (chronic kidney disease) stage 4, GFR 15-29 ml/min (HCC) 01/15/2015  . Hypothyroidism 11/22/2014  . Acute on chronic diastolic congestive heart failure ([place]) 07/04/2014  . COPD GOLD III  02/16/2014  . History of tobacco use 08/23/2013  . 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 CAD (coronary artery disease) 04/06/2013  . PVD - hx of Rt SFA PTA and s/p Rt 4-5th toe amp Dec 2014 04/05/2013  . Essential hypertension 11/25/2012  . Mixed hyperlipidemia 07/17/2008    Current Outpatient Prescriptions:  .  amLODipine (NORVASC) 10 MG tablet, Take 1 tablet (10 mg total) by mouth daily., Disp: 30 tablet, Rfl: 6 .  aspirin 81 MG tablet, TAKE 81 MG BY MOUTH EVERY DAY, Disp: 30 tablet, Rfl: 3 .  atorvastatin (LIPITOR) 40 MG tablet, TAKE 1 TABLET BY MOUTH EVERY DAY AT 6 PM (Patient taking differently: Take 40 mg by mouth daily. ), Disp: 90 tablet, Rfl: 3 .  carvedilol (COREG) 25 MG tablet, Take 2 tablets (50 mg total) by mouth 2 (two) times  daily with a meal., Disp: 120 tablet, Rfl: 3 .  cloNIDine (CATAPRES) 0.2 MG tablet, TAKE ONE (1) TABLET BY MOUTH TWO (2) TIMES DAILY, Disp: 60 tablet, Rfl: 0 .  doxazosin (CARDURA) 8 MG tablet, Take 1 tablet (8 mg total) by mouth 2 (two) times daily., Disp: 60 tablet, Rfl: 6 .  ferrous sulfate (FERROUSUL) 325 (65 FE) MG tablet, Take 1 tablet (325 mg total) by mouth 2 (two) times daily with a meal., Disp: 60 tablet, Rfl: 3 .  hydrALAZINE (APRESOLINE) 100 MG tablet, TAKE ONE TABLET BY MOUTH EVERY EIGHT HOURS (Patient taking differently: Take 100 mg by mouth every 8 (eight) hours. ), Disp: 90 tablet, Rfl: 3 .  insulin aspart (NOVOLOG FLEXPEN) 100 UNIT/ML FlexPen, Inject 12 Units into the skin 3 (three) times daily with meals., Disp: , Rfl:  .  Insulin Glargine (LANTUS SOLOSTAR) 100 UNIT/ML Solostar Pen, Inject 40 Units into the skin at bedtime., Disp: 15 mL, Rfl: 5 .  isosorbide mononitrate (IMDUR) 60 MG 24 hr tablet, Take 1 tablet (60 mg total) by mouth daily., Disp: 30 tablet, Rfl: 3 .  levothyroxine (SYNTHROID, LEVOTHROID) 50 MCG tablet, TAKE 1 TABLET BY MOUTH EVERYDAY BEFORE BREAKFAST, Disp: 30 tablet, Rfl: 2 .  pantoprazole (PROTONIX) 40 MG tablet, TAKE ONE (1) TABLET BY MOUTH EVERY DAY, Disp: 30 tablet, Rfl: 3 .  potassium chloride SA (K-DUR,KLOR-CON) 20 MEQ tablet, Take 1 tablet (20 mEq total) by mouth  2 (two) times daily., Disp: 135 tablet, Rfl: 3 .  torsemide (DEMADEX) 20 MG tablet, Take 3 tablets (60 mg total) by mouth 2 (two) times daily. May take an extra 20 mg Daily for weight gain above 212 lbs, Disp: 190 tablet, Rfl: 3 .  nitroGLYCERIN (NITROSTAT) 0.4 MG SL tablet, Place 1 tablet (0.4 mg total) under the tongue every 5 (five) minutes as needed for chest pain. (Patient not taking: Reported on 05/27/2016), Disp: 30 tablet, Rfl: 12 Allergies  Allergen Reactions  . No Known Allergies      Social History   Social History  . Marital status: Single    Spouse name: N/A  . Number of  children: N/A  . Years of education: N/A   Occupational History  . Not on file.   Social History Main Topics  . Smoking status: Former Smoker    Packs/day: 1.00    Years: 46.00    Types: Cigarettes    Start date: 02/04/1967    Quit date: 02/03/2013  . Smokeless tobacco: Never Used     Comment: Doing well with quitting.  . Alcohol use No  . Drug use: No  . Sexual activity: Not Currently   Other Topics Concern  . Not on file   Social History Narrative   Lives with sister, [name] in [place].  Retired - former Sports coach.    Physical Exam      Future Appointments Date Time Provider [place]  06/02/2016 10:00 AM Hayden Pedro, MD [facility] None  06/06/2016 2:45 PM [facility] LAB 5 [facility] None  06/06/2016 3:15 PM [facility] INJ NURSE [facility] None  06/10/2016 10:00 AM [facility] NURSE [facility] None  06/13/2016 11:00 AM [facility] HS VASC 3 [facility] VVS  06/13/2016 11:45 AM Sharmon Leyden Nickel, NP [facility]  06/16/2016 1:00 PM Hayden Pedro, MD TRE-T[facility] None  06/19/2016 2:45 PM [facility] LAB 3 [facility] None  06/19/2016 3:15 PM [facility] INJ NURSE [facility] None  07/03/2016 2:45 PM [facility] LAB ONLY [facility] None  07/03/2016 3:15 PM [facility] INJ NURSE [facility] None  07/17/2016 2:45 PM [facility] LAB 2 [facility] None  07/17/2016 3:15 PM [facility] INJ NURSE [facility] None  07/23/2016 11:15 AM Gardiner Barefoot, DPM [facility]  07/31/2016 2:45 PM [facility] LAB 5 [facility] None  07/31/2016 3:15 PM [facility] INJ NURSE [facility] None  08/14/2016 2:45 PM [facility] LAB 5 [facility] None  08/14/2016 3:15 PM [facility] INJ NURSE [facility] None  08/28/2016 2:45 PM [facility] LAB 2 [facility] None  08/28/2016 3:15 PM [facility] INJ NURSE [facility] None  09/02/2016 10:30 AM [facility] PA/NP [facility] None  09/11/2016 2:45 PM [facility] LAB 5 [facility] None  09/11/2016 3:15 PM [facility] INJ NURSE [facility] None  09/25/2016 2:45 PM [facility]  LAB 5 [facility] None  09/25/2016 3:15 PM [facility] INJ NURSE [facility] None  10/09/2016 2:45 PM [facility] LAB 5 [facility] None  10/09/2016 3:15 PM [facility] INJ NURSE [facility] None  10/23/2016 2:45 PM [facility] LAB 2 [facility] None  10/23/2016 3:15 PM [facility] INJ NURSE [facility] None  11/06/2016 2:45 PM [facility] LAB 5 [facility] None  11/06/2016 3:15 PM [facility] INJ NURSE [facility] None  11/20/2016 2:45 PM [facility] LAB 6 [facility] None  11/20/2016 3:15 PM Ni Gorsuch, MD [facility] None  11/20/2016 3:45 PM [facility] INJ NURSE [facility] None   BP (!) 180/78   Pulse 74   Resp 16   Wt 206 lb (93.4 kg)   SpO2 97%   BMI 31.32 kg/m  Weight yesterday-206  Last visit weight-210  CBG  EMS- 354   Met pt today for visit, he had dropped his pillbox yesterday so he hasnt taken his meds since then-he went to clinic this morning and his b/p was elevated, no changes noted from visit this morning.  meds verified and pill box refilled.  **clonidine is in pill box through thurs pm **he is out of his levothyroxine-none in pill box  [facility] asa also and the 2 listed above and they will bring them out when ready. He reports he is drinking more than 2L daily and is trying to cut back.  Pt states he ate breakfast this morning, egg sausage biscuit-spoke about his salt intake. He took his insulin this morning but did not check his CBG prior.  He denies any sob, no dizziness, no h/a. Will get [facility] to f/u later this week.   ACTION: Home visit completed  Marylouise Stacks, EMT-Paramedic 05/27/16

## 2016-05-28 ENCOUNTER — Other Ambulatory Visit: Payer: Self-pay | Admitting: Hematology and Oncology

## 2016-05-28 DIAGNOSIS — D539 Nutritional anemia, unspecified: Secondary | ICD-10-CM

## 2016-05-28 NOTE — Progress Notes (Signed)
Pill box revision due to multiple medications running out prior to next scheduled visit.

## 2016-06-02 ENCOUNTER — Encounter (INDEPENDENT_AMBULATORY_CARE_PROVIDER_SITE_OTHER): Payer: Medicaid Other | Admitting: Ophthalmology

## 2016-06-02 DIAGNOSIS — E113313 Type 2 diabetes mellitus with moderate nonproliferative diabetic retinopathy with macular edema, bilateral: Secondary | ICD-10-CM | POA: Diagnosis not present

## 2016-06-02 DIAGNOSIS — H43813 Vitreous degeneration, bilateral: Secondary | ICD-10-CM | POA: Diagnosis not present

## 2016-06-02 DIAGNOSIS — H35033 Hypertensive retinopathy, bilateral: Secondary | ICD-10-CM | POA: Diagnosis not present

## 2016-06-02 DIAGNOSIS — I10 Essential (primary) hypertension: Secondary | ICD-10-CM

## 2016-06-02 DIAGNOSIS — E11311 Type 2 diabetes mellitus with unspecified diabetic retinopathy with macular edema: Secondary | ICD-10-CM | POA: Diagnosis not present

## 2016-06-04 ENCOUNTER — Other Ambulatory Visit (HOSPITAL_COMMUNITY): Payer: Self-pay

## 2016-06-04 NOTE — Progress Notes (Signed)
Paramedicine Encounter    Patient ID: Leonard [name], male    DOB: [birth year], 66 y.o.   MRN: [id number]    Patient Care Team: [name], MD as PCP - General (Family Medicine) [name], MD as Consulting Physician (Nephrology)  Patient Active Problem List   Diagnosis Date Noted  . Pleural effusion   . Oxygen desaturation   . Morbid obesity due to excess calories ([place]) 03/17/2016  . Anemia due to stage 4 chronic kidney disease treated with darbepoetin ([place]) 02/28/2016  . Fatigue 01/21/2016  . Diabetic retinopathy ([place]) 11/13/2015  . Dyspnea 08/28/2015  . CHF (congestive heart failure) ([place]) 08/27/2015  . Iron deficiency anemia 08/03/2015  . Claudication ([place])   . Type 2 diabetes mellitus with stage 3 chronic kidney disease, with long-term current use of insulin ([place])   . Secondary hypertension   . Hematuria 06/27/2015  . Skin lesion of face 02/22/2015  . 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 Deficiency anemia 01/15/2015  . CKD (chronic kidney disease) stage 4, GFR 15-29 ml/min (HCC) 01/15/2015  . Hypothyroidism 11/22/2014  . Acute on chronic diastolic congestive heart failure ([place]) 07/04/2014  . COPD GOLD III  02/16/2014  . History of tobacco use 08/23/2013  . 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 CAD (coronary artery disease) 04/06/2013  . PVD - hx of Rt SFA PTA and s/p Rt 4-5th toe amp Dec 2014 04/05/2013  . Essential hypertension 11/25/2012  . Mixed hyperlipidemia 07/17/2008    Current Outpatient Prescriptions:  .  amLODipine (NORVASC) 10 MG tablet, Take 1 tablet (10 mg total) by mouth daily., Disp: 30 tablet, Rfl: 6 .  aspirin 81 MG tablet, TAKE 81 MG BY MOUTH EVERY DAY, Disp: 30 tablet, Rfl: 3 .  atorvastatin (LIPITOR) 40 MG tablet, TAKE 1 TABLET BY MOUTH EVERY DAY AT 6 PM (Patient taking differently: Take 40 mg by mouth daily. ), Disp: 90 tablet, Rfl: 3 .  carvedilol (COREG) 25 MG tablet, Take 2 tablets (50 mg total) by mouth 2  (two) times daily with a meal., Disp: 120 tablet, Rfl: 3 .  cloNIDine (CATAPRES) 0.2 MG tablet, TAKE ONE (1) TABLET BY MOUTH TWO (2) TIMES DAILY, Disp: 60 tablet, Rfl: 0 .  doxazosin (CARDURA) 8 MG tablet, Take 1 tablet (8 mg total) by mouth 2 (two) times daily., Disp: 60 tablet, Rfl: 6 .  ferrous sulfate (FERROUSUL) 325 (65 FE) MG tablet, Take 1 tablet (325 mg total) by mouth 2 (two) times daily with a meal., Disp: 60 tablet, Rfl: 3 .  hydrALAZINE (APRESOLINE) 100 MG tablet, TAKE ONE TABLET BY MOUTH EVERY EIGHT HOURS (Patient taking differently: Take 100 mg by mouth every 8 (eight) hours. ), Disp: 90 tablet, Rfl: 3 .  insulin aspart (NOVOLOG FLEXPEN) 100 UNIT/ML FlexPen, Inject 12 Units into the skin 3 (three) times daily with meals., Disp: , Rfl:  .  Insulin Glargine (LANTUS SOLOSTAR) 100 UNIT/ML Solostar Pen, Inject 40 Units into the skin at bedtime., Disp: 15 mL, Rfl: 5 .  isosorbide mononitrate (IMDUR) 60 MG 24 hr tablet, Take 1 tablet (60 mg total) by mouth daily., Disp: 30 tablet, Rfl: 3 .  levothyroxine (SYNTHROID, LEVOTHROID) 50 MCG tablet, TAKE 1 TABLET BY MOUTH EVERYDAY BEFORE BREAKFAST, Disp: 30 tablet, Rfl: 2 .  pantoprazole (PROTONIX) 40 MG tablet, TAKE ONE (1) TABLET BY MOUTH EVERY DAY, Disp: 30 tablet, Rfl: 3 .  potassium chloride SA (K-DUR,KLOR-CON) 20 MEQ tablet, Take 1 tablet (20 mEq total)  by mouth 2 (two) times daily., Disp: 135 tablet, Rfl: 3 .  torsemide (DEMADEX) 20 MG tablet, Take 3 tablets (60 mg total) by mouth 2 (two) times daily. May take an extra 20 mg Daily for weight gain above 212 lbs, Disp: 190 tablet, Rfl: 3 .  nitroGLYCERIN (NITROSTAT) 0.4 MG SL tablet, Place 1 tablet (0.4 mg total) under the tongue every 5 (five) minutes as needed for chest pain. (Patient not taking: Reported on 05/27/2016), Disp: 30 tablet, Rfl: 12 Allergies  Allergen Reactions  . No Known Allergies      Social History   Social History  . Marital status: Single    Spouse name: N/A  .  Number of children: N/A  . Years of education: N/A   Occupational History  . Not on file.   Social History Main Topics  . Smoking status: Former Smoker    Packs/day: 1.00    Years: 46.00    Types: Cigarettes    Start date: 02/04/1967    Quit date: 02/03/2013  . Smokeless tobacco: Never Used     Comment: Doing well with quitting.  . Alcohol use No  . Drug use: No  . Sexual activity: Not Currently   Other Topics Concern  . Not on file   Social History Narrative   Lives with sister, [name] in [place].  Retired - former Sports coach.    Physical Exam  Eyes: Pupils are equal, round, and reactive to light.  Pulmonary/Chest: No respiratory distress. He has no wheezes. He has no rales.  Abdominal: He exhibits no distension. There is no tenderness. There is no guarding.  Musculoskeletal: He exhibits edema.  Skin: Skin is warm and dry. He is not diaphoretic.        Future Appointments Date Time Provider [place]  06/06/2016 2:45 PM [facility] LAB 5 [facility] None  06/06/2016 3:15 PM [facility] INJ NURSE [facility] None  06/10/2016 10:00 AM [facility] NURSE [facility] None  06/13/2016 11:00 AM [facility] HS VASC 3 [facility] VVS  06/13/2016 11:45 AM Sharmon Leyden Nickel, NP [facility]  06/16/2016 1:00 PM Hayden Pedro, MD T[facility]RE None  06/19/2016 2:45 PM [facility] LAB 3 [facility] None  06/19/2016 3:15 PM [facility] INJ NURSE [facility] None  07/03/2016 9:30 AM Hayden Pedro, MD TRE-TRE None  07/03/2016 2:45 PM [facility] LAB ONLY [facility] None  07/03/2016 3:15 PM [facility] INJ NURSE [facility] None  07/17/2016 2:45 PM [facility] LAB 2 [facility] None  07/17/2016 3:15 PM [facility] INJ NURSE [facility] None  07/23/2016 11:15 AM Gardiner Barefoot, DPM [facility]  07/31/2016 2:45 PM [facility] LAB 5 [facility] None  07/31/2016 3:15 PM [facility] INJ NURSE [facility] None  08/14/2016 2:45 PM [facility] LAB 5 [facility] None  08/14/2016 3:15 PM [facility] INJ NURSE  [facility] None  08/28/2016 2:45 PM [facility] LAB 2 [facility] None  08/28/2016 3:15 PM [facility] INJ NURSE [facility] None  09/02/2016 10:30 AM [facility] PA/NP [facility] None  09/11/2016 2:45 PM [facility] LAB 5 [facility] None  09/11/2016 3:15 PM [facility] INJ NURSE [facility] None  09/25/2016 2:45 PM [facility] LAB 5 [facility] None  09/25/2016 3:15 PM [facility] INJ NURSE [facility] None  10/09/2016 2:45 PM [facility] LAB 5 [facility] None  10/09/2016 3:15 PM [facility] INJ NURSE [facility] None  10/23/2016 2:45 PM [facility] LAB 2 [facility] None  10/23/2016 3:15 PM [facility] INJ NURSE [facility] None  11/06/2016 2:45 PM [facility] LAB 5 [facility] None  11/06/2016 3:15 PM [facility] INJ NURSE [facility] None  11/20/2016 2:45 PM [facility] LAB 6 [facility]  None  11/20/2016 3:15 PM Heath Lark, MD [facility] None  11/20/2016 3:45 PM [facility] INJ NURSE [facility] None   ATF pt had left with his niece prior to my arrival therefore I began to fill his pill box.  Pt had missed two doses (thurs evening and sat evening).  Pt arrived on scene prior to me leaving and reported that he has no complaints.  Pt's apartment is currently being remolded and his belongings has been displaced. Pt stated that his scale isn't plugged in therefore he hasn't weighed himself in almost a week.  Pt does not have a regular scale, so I gave him one from the heart clinic (temp).  Pt denies sob, dizziness, headaches, and chest pains.  rx bottles verified and pill box filled.  Theres several bottles missing and when I tried calling them in but the pharmacy stated that it was too early to refill. Pt has misplaced the medications. Edema in both lower legs +3.   rx called in asa (filled until sun-no pill in mon) Carvedilol  Isosorbide (no pills fri-wed) pharmacy stated that pt just had it filled on 05/27/16 and its too early  Pantoprazole (no pill in wed)pharmacy stated that pt just had it filled on 05/27/16  and its too early   Pt weighed outside with all of his clothes on today on the scale that I gave him.  I asked him to call me tomorrow am with his weight.  BP (!) 158/60 (BP Location: Right Arm, Patient Position: Sitting, Cuff Size: Normal)   Pulse 77   Resp 12   SpO2 99%   Weight yesterday-didn't weigh Last visit weight-206    Neiman Roots, EMT Paramedic 06/04/2016    ACTION: Home visit completed Next visit planned for next week

## 2016-06-05 ENCOUNTER — Encounter: Payer: Self-pay | Admitting: Family

## 2016-06-06 ENCOUNTER — Other Ambulatory Visit (HOSPITAL_BASED_OUTPATIENT_CLINIC_OR_DEPARTMENT_OTHER): Payer: Medicare Other

## 2016-06-06 ENCOUNTER — Ambulatory Visit (HOSPITAL_BASED_OUTPATIENT_CLINIC_OR_DEPARTMENT_OTHER): Payer: Medicare Other

## 2016-06-06 VITALS — BP 136/56 | HR 71 | Temp 97.1°F | Resp 16

## 2016-06-06 DIAGNOSIS — D631 Anemia in chronic kidney disease: Secondary | ICD-10-CM

## 2016-06-06 DIAGNOSIS — N184 Chronic kidney disease, stage 4 (severe): Secondary | ICD-10-CM

## 2016-06-06 DIAGNOSIS — D539 Nutritional anemia, unspecified: Secondary | ICD-10-CM | POA: Diagnosis not present

## 2016-06-06 LAB — CBC WITH DIFFERENTIAL/PLATELET
BASO%: 0.5 % (ref 0.0–2.0)
Basophils Absolute: 0 10*3/uL (ref 0.0–0.1)
EOS ABS: 0.3 10*3/uL (ref 0.0–0.5)
EOS%: 4.8 % (ref 0.0–7.0)
HEMATOCRIT: 30.3 % — AB (ref 38.4–49.9)
HGB: 9.2 g/dL — ABNORMAL LOW (ref 13.0–17.1)
LYMPH#: 1 10*3/uL (ref 0.9–3.3)
LYMPH%: 16.1 % (ref 14.0–49.0)
MCH: 24.3 pg — ABNORMAL LOW (ref 27.2–33.4)
MCHC: 30.4 g/dL — ABNORMAL LOW (ref 32.0–36.0)
MCV: 79.9 fL (ref 79.3–98.0)
MONO#: 0.6 10*3/uL (ref 0.1–0.9)
MONO%: 10.1 % (ref 0.0–14.0)
NEUT%: 68.5 % (ref 39.0–75.0)
NEUTROS ABS: 4.3 10*3/uL (ref 1.5–6.5)
PLATELETS: 169 10*3/uL (ref 140–400)
RBC: 3.79 10*6/uL — AB (ref 4.20–5.82)
RDW: 18.7 % — ABNORMAL HIGH (ref 11.0–14.6)
WBC: 6.2 10*3/uL (ref 4.0–10.3)

## 2016-06-06 MED ORDER — DARBEPOETIN ALFA 500 MCG/ML IJ SOSY
500.0000 ug | PREFILLED_SYRINGE | Freq: Once | INTRAMUSCULAR | Status: AC
  Administered 2016-06-06: 500 ug via SUBCUTANEOUS
  Filled 2016-06-06: qty 1

## 2016-06-09 ENCOUNTER — Telehealth: Payer: Self-pay | Admitting: Hematology

## 2016-06-09 ENCOUNTER — Other Ambulatory Visit: Payer: Self-pay | Admitting: Hematology and Oncology

## 2016-06-09 ENCOUNTER — Telehealth: Payer: Self-pay

## 2016-06-09 LAB — ERYTHROPOIETIN: ERYTHROPOIETIN: 39.8 m[IU]/mL — AB (ref 2.6–18.5)

## 2016-06-09 LAB — IRON AND TIBC
%SAT: 15 % — ABNORMAL LOW (ref 20–55)
IRON: 43 ug/dL (ref 42–163)
TIBC: 295 ug/dL (ref 202–409)
UIBC: 252 ug/dL (ref 117–376)

## 2016-06-09 LAB — VITAMIN B12: Vitamin B12: 516 pg/mL (ref 232–1245)

## 2016-06-09 LAB — FERRITIN: Ferritin: 45 ng/ml (ref 22–316)

## 2016-06-09 NOTE — Telephone Encounter (Signed)
lvm to inform pt of 5/11 appt per LOS. Called secondary number due to primary number kept giving a busy signal

## 2016-06-09 NOTE — Telephone Encounter (Signed)
-----   Message from Heath Lark, MD sent at 06/09/2016  9:53 AM EDT ----- Regarding: IV iron pls let him know he needs one dose of IV iron I have placed scheduling msg for IV feraheme on 5/11. Please make sure he understands he need IV iron before his next dose of Aranesp ----- Message ----- From: Interface, Lab In Three Zero One Sent: 06/06/2016   2:59 PM To: Heath Lark, MD

## 2016-06-09 NOTE — Telephone Encounter (Signed)
Phone number busy, will call back.

## 2016-06-09 NOTE — Telephone Encounter (Signed)
Called and left message for patient to call office to give below message.

## 2016-06-10 ENCOUNTER — Telehealth: Payer: Self-pay

## 2016-06-10 ENCOUNTER — Ambulatory Visit (HOSPITAL_COMMUNITY)
Admission: RE | Admit: 2016-06-10 | Discharge: 2016-06-10 | Disposition: A | Discharge status: Home / Self Care | Payer: Medicare Other | Source: Ambulatory Visit | Attending: Internal Medicine | Admitting: Internal Medicine

## 2016-06-10 ENCOUNTER — Encounter (HOSPITAL_COMMUNITY): Payer: Self-pay

## 2016-06-10 DIAGNOSIS — I10 Essential (primary) hypertension: Secondary | ICD-10-CM | POA: Insufficient documentation

## 2016-06-10 MED ORDER — CLONIDINE HCL 0.2 MG PO TABS: 0.4000 mg | ORAL_TABLET | Freq: Two times a day (BID) | ORAL | 3 refills | Status: DC

## 2016-06-10 NOTE — Patient Instructions (Signed)
INCREASE Clonidine to 0.4 mg (2 Tablets) two times daily

## 2016-06-10 NOTE — Telephone Encounter (Signed)
Called patient with below message, verbalized understanding.

## 2016-06-10 NOTE — Telephone Encounter (Signed)
-----   Message from Heath Lark, MD sent at 06/09/2016  9:53 AM EDT ----- Regarding: IV iron pls let him know he needs one dose of IV iron I have placed scheduling msg for IV feraheme on 5/11. Please make sure he understands he need IV iron before his next dose of Aranesp ----- Message ----- From: Interface, Lab In Three Zero One Sent: 06/06/2016   2:59 PM To: Heath Lark, MD

## 2016-06-11 ENCOUNTER — Other Ambulatory Visit (HOSPITAL_COMMUNITY): Payer: Self-pay

## 2016-06-11 NOTE — Progress Notes (Signed)
Paramedicine Encounter    Patient ID: Leonard [name], male    DOB: 1950/03/14, 66 y.o.   MRN: [id number]    Patient Care Team: [name], MD as PCP - General (Family Medicine) [name], MD as Consulting Physician (Nephrology)  Patient Active Problem List   Diagnosis Date Noted  . Pleural effusion   . Oxygen desaturation   . Morbid obesity due to excess calories ([place]) 03/17/2016  . Anemia due to stage 4 chronic kidney disease treated with darbepoetin ([place]) 02/28/2016  . Fatigue 01/21/2016  . Diabetic retinopathy ([place]) 11/13/2015  . Dyspnea 08/28/2015  . CHF (congestive heart failure) ([place]) 08/27/2015  . Iron deficiency anemia 08/03/2015  . Claudication ([place])   . Type 2 diabetes mellitus with stage 3 chronic kidney disease, with long-term current use of insulin ([place])   . Secondary hypertension   . Hematuria 06/27/2015  . Skin lesion of face 02/22/2015  . 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 Deficiency anemia 01/15/2015  . CKD (chronic kidney disease) stage 4, GFR 15-29 ml/min (HCC) 01/15/2015  . Hypothyroidism 11/22/2014  . Acute on chronic diastolic congestive heart failure ([place]) 07/04/2014  . COPD GOLD III  02/16/2014  . History of tobacco use 08/23/2013  . 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 CAD (coronary artery disease) 04/06/2013  . PVD - hx of Rt SFA PTA and s/p Rt 4-5th toe amp Dec 2014 04/05/2013  . Essential hypertension 11/25/2012  . Mixed hyperlipidemia 07/17/2008    Current Outpatient Prescriptions:  .  amLODipine (NORVASC) 10 MG tablet, Take 1 tablet (10 mg total) by mouth daily., Disp: 30 tablet, Rfl: 6 .  aspirin 81 MG tablet, TAKE 81 MG BY MOUTH EVERY DAY, Disp: 30 tablet, Rfl: 3 .  atorvastatin (LIPITOR) 40 MG tablet, TAKE 1 TABLET BY MOUTH EVERY DAY AT 6 PM (Patient taking differently: Take 40 mg by mouth daily. ), Disp: 90 tablet, Rfl: 3 .  carvedilol (COREG) 25 MG tablet, Take 2 tablets (50 mg total) by mouth 2  (two) times daily with a meal., Disp: 120 tablet, Rfl: 3 .  cloNIDine (CATAPRES) 0.2 MG tablet, Take 2 tablets (0.4 mg total) by mouth 2 (two) times daily., Disp: 120 tablet, Rfl: 3 .  doxazosin (CARDURA) 8 MG tablet, Take 1 tablet (8 mg total) by mouth 2 (two) times daily., Disp: 60 tablet, Rfl: 6 .  ferrous sulfate (FERROUSUL) 325 (65 FE) MG tablet, Take 1 tablet (325 mg total) by mouth 2 (two) times daily with a meal., Disp: 60 tablet, Rfl: 3 .  hydrALAZINE (APRESOLINE) 100 MG tablet, TAKE ONE TABLET BY MOUTH EVERY EIGHT HOURS (Patient taking differently: Take 100 mg by mouth every 8 (eight) hours. ), Disp: 90 tablet, Rfl: 3 .  insulin aspart (NOVOLOG FLEXPEN) 100 UNIT/ML FlexPen, Inject 12 Units into the skin 3 (three) times daily with meals., Disp: , Rfl:  .  Insulin Glargine (LANTUS SOLOSTAR) 100 UNIT/ML Solostar Pen, Inject 40 Units into the skin at bedtime., Disp: 15 mL, Rfl: 5 .  isosorbide mononitrate (IMDUR) 60 MG 24 hr tablet, Take 1 tablet (60 mg total) by mouth daily., Disp: 30 tablet, Rfl: 3 .  levothyroxine (SYNTHROID, LEVOTHROID) 50 MCG tablet, TAKE 1 TABLET BY MOUTH EVERYDAY BEFORE BREAKFAST, Disp: 30 tablet, Rfl: 2 .  pantoprazole (PROTONIX) 40 MG tablet, TAKE ONE (1) TABLET BY MOUTH EVERY DAY, Disp: 30 tablet, Rfl: 3 .  potassium chloride SA (K-DUR,KLOR-CON) 20 MEQ tablet, Take 1 tablet (20  mEq total) by mouth 2 (two) times daily., Disp: 135 tablet, Rfl: 3 .  torsemide (DEMADEX) 20 MG tablet, Take 3 tablets (60 mg total) by mouth 2 (two) times daily. May take an extra 20 mg Daily for weight gain above 212 lbs, Disp: 190 tablet, Rfl: 3 .  nitroGLYCERIN (NITROSTAT) 0.4 MG SL tablet, Place 1 tablet (0.4 mg total) under the tongue every 5 (five) minutes as needed for chest pain. (Patient not taking: Reported on 05/27/2016), Disp: 30 tablet, Rfl: 12 Allergies  Allergen Reactions  . No Known Allergies      Social History   Social History  . Marital status: Single    Spouse name:  N/A  . Number of children: N/A  . Years of education: N/A   Occupational History  . Not on file.   Social History Main Topics  . Smoking status: Former Smoker    Packs/day: 1.00    Years: 46.00    Types: Cigarettes    Start date: 02/04/1967    Quit date: 02/03/2013  . Smokeless tobacco: Never Used     Comment: Doing well with quitting.  . Alcohol use No  . Drug use: No  . Sexual activity: Not Currently   Other Topics Concern  . Not on file   Social History Narrative   Lives with sister, [name] in [place].  Retired - former Sports coach.    Physical Exam  Eyes: Pupils are equal, round, and reactive to light.  Pulmonary/Chest: No respiratory distress. He has no wheezes. He has no rales.  Abdominal: He exhibits no distension. There is no tenderness. There is no guarding.  Skin: Skin is warm and dry. He is not diaphoretic.        Future Appointments Date Time Provider [place]  06/13/2016 11:00 AM [facility] HS VASC 3 [facility] V[facility]  06/13/2016 11:45 AM Nickel, Sharmon Leyden, NP [facility]  06/13/2016 1:15 PM [facility] F18 [facility] None  06/16/2016 1:00 PM Hayden Pedro, MD [facility] None  06/19/2016 2:45 PM [facility] LAB 3 [facility] None  06/19/2016 3:15 PM [facility] INJ NURSE [facility] None  07/03/2016 9:30 AM Hayden Pedro, MD [facility] None  07/03/2016 2:45 PM [facility] LAB ONLY [facility] None  07/03/2016 3:15 PM [facility] INJ NURSE [facility] None  07/17/2016 2:45 PM [facility] LAB 2 [facility] None  07/17/2016 3:15 PM [facility] INJ NURSE [facility] None  07/23/2016 11:15 AM Gardiner Barefoot, DPM [facility]  07/31/2016 2:45 PM [facility] LAB 5 [facility] None  07/31/2016 3:15 PM [facility] INJ NURSE [facility] None  08/14/2016 2:45 PM [facility] LAB 5 [facility] None  08/14/2016 3:15 PM [facility] INJ NURSE [facility] None  08/28/2016 2:45 PM [facility] LAB 2 [facility] None  08/28/2016 3:15 PM [facility] INJ NURSE [facility] None   09/02/2016 10:30 AM [facility] PA/NP [facility] None  09/11/2016 2:45 PM [facility] LAB 5 [facility] None  09/11/2016 3:15 PM [facility] INJ NURSE [facility] None  09/25/2016 2:45 PM [facility] LAB 5 [facility] None  09/25/2016 3:15 PM [facility] INJ NURSE [facility] None  10/09/2016 2:45 PM [facility] LAB 5 [facility] None  10/09/2016 3:15 PM [facility] INJ NURSE [facility] None  10/23/2016 2:45 PM [facility] LAB 2 [facility] None  10/23/2016 3:15 PM [facility] INJ NURSE [facility] None  11/06/2016 2:45 PM [facility] LAB 5 [facility] None  11/06/2016 3:15 PM [facility] INJ NURSE [facility] None  11/20/2016 2:45 PM [facility] LAB 6 [facility] None  11/20/2016 3:15 PM Alvy Bimler, Ni, MD [facility] None  11/20/2016 3:45 PM [facility] INJ NURSE [facility] None  ATF pt CAO x4 sitting outside on the porch. Pt stated that he had chest tightness on Monday that went away on its own. Pt stated that he was just "sitting there watching tv" when the pain started.  pt has not had any chest pain since. Pt denies sob, dizziness, and headache today. Pt stated that he has had no issues with urinatation. Went to bed last last night and ate late last night. He stated that he had trouble sleeping last night but it wasn't due to sob . He Ate sausage, egg and cheese biscuit this morning for breakfast and he hasn't taken insulin.  Pt missed two evening of medications in a row (Friday and sat), he stated that he fell asleep early.  Pt was advised to cut back on the foods high in sodium.  Pt has an alarm to remind him to take his medications.  rx bottles verified and pill box refilled.    Edema in left leg only.  BP (!) 142/64 (BP Location: Right Arm, Patient Position: Sitting, Cuff Size: Normal)   Pulse 63   Resp 16   Wt 209 lb (94.8 kg)   SpO2 99%   BMI 31.78 kg/m   cbg 368 ( have not taken insulin) **rx called in: Insulin Clonidine (the dose increased during last visit) Weight yesterday-210 Last visit  weight-217 (weighed outside with clothes on)    Anayansi Rundquist, EMT Paramedic 06/11/2016  ACTION: Home visit completed Next visit planned for next wednesday

## 2016-06-13 ENCOUNTER — Ambulatory Visit (HOSPITAL_COMMUNITY)
Admission: RE | Admit: 2016-06-13 | Discharge: 2016-06-13 | Disposition: A | Discharge status: Home / Self Care | Payer: Medicare Other | Source: Ambulatory Visit | Attending: Family | Admitting: Family

## 2016-06-13 ENCOUNTER — Encounter: Payer: Self-pay | Admitting: Family

## 2016-06-13 ENCOUNTER — Ambulatory Visit (INDEPENDENT_AMBULATORY_CARE_PROVIDER_SITE_OTHER): Payer: Medicare Other | Admitting: Family

## 2016-06-13 ENCOUNTER — Ambulatory Visit (HOSPITAL_BASED_OUTPATIENT_CLINIC_OR_DEPARTMENT_OTHER): Payer: Medicare Other

## 2016-06-13 VITALS — BP 139/69 | HR 64 | Temp 97.8°F | Resp 18 | Ht 68.0 in | Wt 214.0 lb

## 2016-06-13 VITALS — BP 166/61 | HR 60 | Temp 97.4°F | Resp 16

## 2016-06-13 DIAGNOSIS — I70261 Atherosclerosis of native arteries of extremities with gangrene, right leg: Secondary | ICD-10-CM | POA: Insufficient documentation

## 2016-06-13 DIAGNOSIS — N189 Chronic kidney disease, unspecified: Principal | ICD-10-CM

## 2016-06-13 DIAGNOSIS — I779 Disorder of arteries and arterioles, unspecified: Secondary | ICD-10-CM | POA: Diagnosis not present

## 2016-06-13 DIAGNOSIS — D509 Iron deficiency anemia, unspecified: Secondary | ICD-10-CM | POA: Diagnosis present

## 2016-06-13 DIAGNOSIS — N184 Chronic kidney disease, stage 4 (severe): Secondary | ICD-10-CM | POA: Diagnosis not present

## 2016-06-13 DIAGNOSIS — D631 Anemia in chronic kidney disease: Secondary | ICD-10-CM

## 2016-06-13 DIAGNOSIS — N183 Chronic kidney disease, stage 3 unspecified: Secondary | ICD-10-CM

## 2016-06-13 DIAGNOSIS — Z9862 Peripheral vascular angioplasty status: Secondary | ICD-10-CM | POA: Diagnosis not present

## 2016-06-13 DIAGNOSIS — Z87891 Personal history of nicotine dependence: Secondary | ICD-10-CM | POA: Diagnosis not present

## 2016-06-13 MED ORDER — FERUMOXYTOL INJECTION 510 MG/17 ML
510.0000 mg | Freq: Once | INTRAVENOUS | Status: AC
  Administered 2016-06-13: 510 mg via INTRAVENOUS
  Filled 2016-06-13: qty 17

## 2016-06-13 MED ORDER — SODIUM CHLORIDE 0.9 % IV SOLN
Freq: Once | INTRAVENOUS | Status: AC
  Administered 2016-06-13: 14:00:00 via INTRAVENOUS

## 2016-06-13 NOTE — Progress Notes (Signed)
[facility]    CC: Follow up peripheral artery occlusive disease  History of Present Illness Leonard [name] is a 66 y.o. malepatient of Dr. Bridgett Larsson who is s/p orbital atherectomy of Right superficial femoral artery x 2, angioplasty of Right superficial femoral artery x 2 (3 mm x 60 mm, 4 mm x 60 mm) on 01-13-13 by Dr. Bridgett Larsson, and open amputation R 4th and 5th toe by Dr. 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 He is also s/p left upper arm brachiocephalic AV fistula creation on 02-11-16 by Dr. Bridgett Larsson for hemodialysis use to address his ESRD.  He is not yet on dialysis.  Pt denies any steal symptoms in his left upper extremity.   He returns today for PAOD evaluation.   When Dr. Bridgett Larsson evaluated pt on 11-28-15, his assessment was RLE critical limb ischemia s/p % 4th and 5th toe amputation, s/p R SFA OA+PTA, R open amp 4th and 5th toes. Pt's ABI clearly demonstrated occlusion of R SFA. The patient was asx at that time, likely due to limited ambulation. Dr. Bridgett Larsson encouraged the walking plan. Based on the patient's vascular studies and examination, Dr. Bridgett Larsson offered the patient: q6 month ABI.  He states that he walks 8 minutes 3x/week. He states his left calf is painful only after walking up an incline; states his right leg sometimes bothers him with walking.  He denies any known history of stroke or TIA.   Pt is falling asleep sitting on the exam table. He states that he had sleeps studies several years ago, does not know the results.   Pt Diabetic: Yes, no recent A1C results on file, pt states his home glucose checks are 300's Pt smoker: former smoker, quit in January 2015 when he had an MI, started smoking in 1969  Pt meds include: Statin :Yes Betablocker: Yes ASA: Yes Other anticoagulants/antiplatelets: no  Past Medical History:  Diagnosis Date  . Cancer ([place])    skin cancer-  . CHF (congestive heart failure) ([place])   . Chronic kidney  disease    not on dialysis  . Coronary artery disease   . Gangrene of toe ([place])    right 5th/notes 01/04/2013   . High cholesterol   . Hypertension   . Iron deficiency anemia 08/03/2015  . 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 PAD (peripheral artery disease) ([place])   . Shortness of breath dyspnea    with exertion  . Type II diabetes mellitus ([place])    Type II    Social History Social History  Substance Use Topics  . Smoking status: Former Smoker    Packs/day: 1.00    Years: 46.00    Types: Cigarettes    Start date: 02/04/1967    Quit date: 02/03/2013  . Smokeless tobacco: Never Used     Comment: Doing well with quitting.  . Alcohol use No    Family History Family History  Problem Relation Age of Onset  . Cancer Mother        lung cancer  . Hypertension Mother   . Cancer Sister        patient thinks it was uterine cancer  . Hypertension Sister   . Heart attack Sister   . Stroke Neg Hx     Past Surgical History:  Procedure Laterality Date  . AMPUTATION Right 01/28/2013   Procedure: RAY AMPUTATION RIGHT  4th & 5th TOE;  Surgeon: Rosetta Posner, MD;  Location[facility]  OR;  Service: Vascular;  Laterality: Right;  . ANGIOPLASTY / STENTING FEMORAL Right 01/13/2013   superficial femoral artery x 2 (3 mm x 60 mm, 4 mm x 60 mm)  . AV FISTULA PLACEMENT Left 02/11/2016   Procedure: LEFT UPPER ARM BRACHIAL CEPHALIC ARTERIOVENOUS (AV) FISTULA CREATION;  Surgeon: Conrad Maysville, MD;  Location: [place];  Service: Vascular;  Laterality: Left;  . COLONOSCOPY W/ POLYPECTOMY    . CORONARY ARTERY BYPASS GRAFT N/A 04/07/2013   Procedure: CORONARY ARTERY BYPASS GRAFTING (CABG);  Surgeon: Ivin Poot, MD;  Location: [place];  Service: Open Heart Surgery;  Laterality: N/A;  . INTRAOPERATIVE TRANSESOPHAGEAL ECHOCARDIOGRAM N/A 04/07/2013   Procedure: INTRAOPERATIVE TRANSESOPHAGEAL ECHOCARDIOGRAM;  Surgeon: Ivin Poot, MD;  Location: [place];  Service: Open Heart Surgery;   Laterality: N/A;  . LEFT HEART CATHETERIZATION WITH CORONARY ANGIOGRAM N/A 04/05/2013   Procedure: LEFT HEART CATHETERIZATION WITH CORONARY ANGIOGRAM;  Surgeon: Peter M Martinique, MD;  Location: [facility] CATH LAB;  Service: Cardiovascular;  Laterality: N/A;  . LOWER EXTREMITY ANGIOGRAM Bilateral 01/06/2013   Procedure: LOWER EXTREMITY ANGIOGRAM;  Surgeon: Conrad Tar[place], MD;  Location[facility] C[facility] LAB;  Service: Cardiovascular;  Laterality: Bilateral;  . LOWER EXTREMITY ANGIOGRAM Right 01/13/2013   Procedure: LOWER EXTREMITY ANGIOGRAM;  Surgeon: Conrad Corn, MD;  Location: [facility] CATH LAB;  Service: Cardiovascular;  Laterality: Right;  . SKIN CANCER EXCISION  2017  . THROAT SURGERY  1983   polyps  . TONSILLECTOMY AND ADENOIDECTOMY  ~ 1965    Allergies  Allergen Reactions  . No Known Allergies     Current Outpatient Prescriptions  Medication Sig Dispense Refill  . amLODipine (NORVASC) 10 MG tablet Take 1 tablet (10 mg total) by mouth daily. 30 tablet 6  . aspirin 81 MG tablet TAKE 81 MG BY MOUTH EVERY DAY 30 tablet 3  . atorvastatin (LIPITOR) 40 MG tablet TAKE 1 TABLET BY MOUTH EVERY DAY AT 6 PM (Patient taking differently: Take 40 mg by mouth daily. ) 90 tablet 3  . carvedilol (COREG) 25 MG tablet Take 2 tablets (50 mg total) by mouth 2 (two) times daily with a meal. 120 tablet 3  . cloNIDine (CATAPRES) 0.2 MG tablet Take 2 tablets (0.4 mg total) by mouth 2 (two) times daily. 120 tablet 3  . doxazosin (CARDURA) 8 MG tablet Take 1 tablet (8 mg total) by mouth 2 (two) times daily. 60 tablet 6  . ferrous sulfate (FERROUSUL) 325 (65 FE) MG tablet Take 1 tablet (325 mg total) by mouth 2 (two) times daily with a meal. 60 tablet 3  . hydrALAZINE (APRESOLINE) 100 MG tablet TAKE ONE TABLET BY MOUTH EVERY EIGHT HOURS (Patient taking differently: Take 100 mg by mouth every 8 (eight) hours. ) 90 tablet 3  . insulin aspart (NOVOLOG FLEXPEN) 100 UNIT/ML FlexPen Inject 12 Units into the skin 3 (three) times daily with  meals.    . Insulin Glargine (LANTUS SOLOSTAR) 100 UNIT/ML Solostar Pen Inject 40 Units into the skin at bedtime. 15 mL 5  . isosorbide mononitrate (IMDUR) 60 MG 24 hr tablet Take 1 tablet (60 mg total) by mouth daily. 30 tablet 3  . levothyroxine (SYNTHROID, LEVOTHROID) 50 MCG tablet TAKE 1 TABLET BY MOUTH EVERYDAY BEFORE BREAKFAST 30 tablet 2  . nitroGLYCERIN (NITROSTAT) 0.4 MG SL tablet Place 1 tablet (0.4 mg total) under the tongue every 5 (five) minutes as needed for chest pain. 30 tablet 12  . pantoprazole (PROTONIX) 40 MG tablet TAKE  ONE (1) TABLET BY MOUTH EVERY DAY 30 tablet 3  . potassium chloride SA (K-DUR,KLOR-CON) 20 MEQ tablet Take 1 tablet (20 mEq total) by mouth 2 (two) times daily. 135 tablet 3  . torsemide (DEMADEX) 20 MG tablet Take 3 tablets (60 mg total) by mouth 2 (two) times daily. May take an extra 20 mg Daily for weight gain above 212 lbs 190 tablet 3   No current facility-administered medications for this visit.     ROS: See HPI for pertinent positives and negatives.   Physical Examination  Vitals:   06/13/16 1105  BP: 139/69  Pulse: 64  Resp: 18  Temp: 97.8 F (36.6 C)  TempSrc: Oral  SpO2: 96%  Weight: 214 lb (97.1 kg)  Height: 5\' 8"  (1.727 m)   Body mass index is 32.54 kg/m.  General: Alert&Ox 3, obese male in NAD  Pulmonary: Sym exp, respirations are non labored, good B air movt,CTA B  Cardiac: RRR, Nl S1, S2, no detected murmur  Vascular: Vessel Right Left  Radial Palpable Palpable  Brachial Palpable Palpable  Carotid Palpable, No Bruit Palpable, No Bruit  Aorta Not palpable N/A  Femoral Palpable Palpable  Popliteal Not palpable Not palpable  PT Not palpable Not palpable  DP Not palpable Not palpable   Gastrointestinal: soft, non-distended, non-tender to palpation, No guarding or rebound, no HSM, no palpable masses, no CVAT B, No palpable prominent aortic pulsedue to pannus   Musculoskeletal: M/S 5/5 throughout , Extremities  without ischemic changes except healed R 4th and 5th toe amp, 1+ non pitting and pitting edema in bilateral lower legs and feet.  Neurologic: CN 2-12 intact, Pain and light touch intact in extremities, Motor exam as listed above. Falling asleep on the exam table.       ASSESSMENT: Leonard [name] is a 66 y.o. male who is s/p orbital atherectomy of Right superficial femoral artery x 2, angioplasty of Right superficial femoral artery x 2 (3 mm x 60 mm, 4 mm x 60 mm) on 01-13-13 by Dr. Bridgett Larsson, and open amputation R 4th and 5th toe by Dr. 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 He is also s/p left upper arm brachiocephalic AV fistula creation on 02-11-16 by Dr. Bridgett Larsson for hemodialysis use to address his ESRD.  He is not yet on dialysis.  Pt denies any steal symptoms in his left upper extremity.   He has moderate claudication in his left calf, no rest pain, no signs of ischemia in his feet or legs; no complaints re his right leg.   I advised him to work closely with his primary care provider to help him get his DM under as good control as possible.  Fortunately he stopped smoking in January 2015 when he had an MI.   DATA (06-13-16): ABI: Right: [place] DP, 0.37 PT(was 1.11 on 11-28-15 with monophasic waveforms); TBI: 0.31 (was 0.31 on 11-28-15) Left: 0.90 PT (was 0.84 on 11-28-15), [place] DP (was 1.51 on 11-28-15); waveforms: monophasic; TBI: absent (was 0.20) Unreliable ABI's bilaterally; all monophasic waveforms; stable TBI on the right, seems to be worse on the left.   PLAN:  Based on the patient's vascular studies and examination, pt will return to clinic in 6 months with ABI's.  I advised him to notify us if he develops concerns re the circulation in his feet or legs.   I discussed in depth with the patient the nature of atherosclerosis, and emphasized the importance of maximal medical management including strict  control of blood pressure, blood glucose, and lipid  levels, obtaining regular exercise, and continued cessation of smoking.  The patient is aware that without maximal medical management the underlying atherosclerotic disease process will progress, limiting the benefit of any interventions.  The patient was given information about PAD including signs, symptoms, treatment, what symptoms should prompt the patient to seek immediate medical care, and risk reduction measures to take.  Clemon Chambers, RN, MSN, FNP-C [facility] [employer] Phone: [phone]  Clinic MD: Bridgett Larsson  06/13/16 11:10 AM

## 2016-06-13 NOTE — Patient Instructions (Signed)
Ferumoxytol injection What is this medicine? FERUMOXYTOL is an iron complex. Iron is used to make healthy red blood cells, which carry oxygen and nutrients throughout the body. This medicine is used to treat iron deficiency anemia in people with chronic kidney disease. COMMON BRAND NAME(S): Feraheme What should I tell my health care provider before I take this medicine? They need to know if you have any of these conditions: -anemia not caused by low iron levels -high levels of iron in the blood -magnetic resonance imaging (MRI) test scheduled -an unusual or allergic reaction to iron, other medicines, foods, dyes, or preservatives -pregnant or trying to get pregnant -breast-feeding How should I use this medicine? This medicine is for injection into a vein. It is given by a health care professional in a hospital or clinic setting. Talk to your pediatrician regarding the use of this medicine in children. Special care may be needed. What if I miss a dose? It is important not to miss your dose. Call your doctor or health care professional if you are unable to keep an appointment. What may interact with this medicine? This medicine may interact with the following medications: -other iron products What should I watch for while using this medicine? Visit your doctor or healthcare professional regularly. Tell your doctor or healthcare professional if your symptoms do not start to get better or if they get worse. You may need blood work done while you are taking this medicine. You may need to follow a special diet. Talk to your doctor. Foods that contain iron include: whole grains/cereals, dried fruits, beans, or peas, leafy green vegetables, and organ meats (liver, kidney). What side effects may I notice from receiving this medicine? Side effects that you should report to your doctor or health care professional as soon as possible: -allergic reactions like skin rash, itching or hives, swelling of the  face, lips, or tongue -breathing problems -changes in blood pressure -feeling faint or lightheaded, falls -fever or chills -flushing, sweating, or hot feelings -swelling of the ankles or feet Side effects that usually do not require medical attention (report to your doctor or health care professional if they continue or are bothersome): -diarrhea -headache -nausea, vomiting -stomach pain Where should I keep my medicine? This drug is given in a hospital or clinic and will not be stored at home.  2017 Elsevier/Gold Standard (2015-02-22 12:41:49)  

## 2016-06-13 NOTE — Patient Instructions (Signed)

## 2016-06-16 ENCOUNTER — Other Ambulatory Visit (INDEPENDENT_AMBULATORY_CARE_PROVIDER_SITE_OTHER): Payer: Medicaid Other | Admitting: Ophthalmology

## 2016-06-16 NOTE — Addendum Note (Signed)
Addended by: Lianne Cure A on: 06/16/2016 02:21 PM   Modules accepted: Orders

## 2016-06-17 ENCOUNTER — Other Ambulatory Visit (HOSPITAL_COMMUNITY): Payer: Self-pay

## 2016-06-17 NOTE — Progress Notes (Signed)
Paramedicine Encounter    Patient ID: Leonard [name], male    DOB: 12/27/1950, 66 y.o.   MRN: [id number]    Patient Care Team: [name], MD as PCP - General (Family Medicine) [name], MD as Consulting Physician (Nephrology)  Patient Active Problem List   Diagnosis Date Noted  . Pleural effusion   . Oxygen desaturation   . Morbid obesity due to excess calories ([place]) 03/17/2016  . Anemia due to stage 4 chronic kidney disease treated with darbepoetin ([place]) 02/28/2016  . Fatigue 01/21/2016  . Diabetic retinopathy ([place]) 11/13/2015  . Dyspnea 08/28/2015  . CHF (congestive heart failure) ([place]) 08/27/2015  . Iron deficiency anemia 08/03/2015  . Claudication ([place])   . Type 2 diabetes mellitus with stage 3 chronic kidney disease, with long-term current use of insulin ([place])   . Secondary hypertension   . Hematuria 06/27/2015  . Skin lesion of face 02/22/2015  . 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 Deficiency anemia 01/15/2015  . CKD (chronic kidney disease) stage 4, GFR 15-29 ml/min (HCC) 01/15/2015  . Hypothyroidism 11/22/2014  . Acute on chronic diastolic congestive heart failure ([place]) 07/04/2014  . COPD GOLD III  02/16/2014  . History of tobacco use 08/23/2013  . 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 CAD (coronary artery disease) 04/06/2013  . PVD - hx of Rt SFA PTA and s/p Rt 4-5th toe amp Dec 2014 04/05/2013  . Essential hypertension 11/25/2012  . Mixed hyperlipidemia 07/17/2008    Current Outpatient Prescriptions:  .  amLODipine (NORVASC) 10 MG tablet, Take 1 tablet (10 mg total) by mouth daily., Disp: 30 tablet, Rfl: 6 .  aspirin 81 MG tablet, TAKE 81 MG BY MOUTH EVERY DAY, Disp: 30 tablet, Rfl: 3 .  atorvastatin (LIPITOR) 40 MG tablet, TAKE 1 TABLET BY MOUTH EVERY DAY AT 6 PM (Patient taking differently: Take 40 mg by mouth daily. ), Disp: 90 tablet, Rfl: 3 .  carvedilol (COREG) 25 MG tablet, Take 2 tablets (50 mg total) by mouth 2  (two) times daily with a meal., Disp: 120 tablet, Rfl: 3 .  cloNIDine (CATAPRES) 0.2 MG tablet, Take 2 tablets (0.4 mg total) by mouth 2 (two) times daily., Disp: 120 tablet, Rfl: 3 .  doxazosin (CARDURA) 8 MG tablet, Take 1 tablet (8 mg total) by mouth 2 (two) times daily., Disp: 60 tablet, Rfl: 6 .  ferrous sulfate (FERROUSUL) 325 (65 FE) MG tablet, Take 1 tablet (325 mg total) by mouth 2 (two) times daily with a meal., Disp: 60 tablet, Rfl: 3 .  hydrALAZINE (APRESOLINE) 100 MG tablet, TAKE ONE TABLET BY MOUTH EVERY EIGHT HOURS (Patient taking differently: Take 100 mg by mouth every 8 (eight) hours. ), Disp: 90 tablet, Rfl: 3 .  insulin aspart (NOVOLOG FLEXPEN) 100 UNIT/ML FlexPen, Inject 12 Units into the skin 3 (three) times daily with meals., Disp: , Rfl:  .  Insulin Glargine (LANTUS SOLOSTAR) 100 UNIT/ML Solostar Pen, Inject 40 Units into the skin at bedtime., Disp: 15 mL, Rfl: 5 .  isosorbide mononitrate (IMDUR) 60 MG 24 hr tablet, Take 1 tablet (60 mg total) by mouth daily., Disp: 30 tablet, Rfl: 3 .  levothyroxine (SYNTHROID, LEVOTHROID) 50 MCG tablet, TAKE 1 TABLET BY MOUTH EVERYDAY BEFORE BREAKFAST, Disp: 30 tablet, Rfl: 2 .  pantoprazole (PROTONIX) 40 MG tablet, TAKE ONE (1) TABLET BY MOUTH EVERY DAY, Disp: 30 tablet, Rfl: 3 .  potassium chloride SA (K-DUR,KLOR-CON) 20 MEQ tablet, Take 1 tablet (20  mEq total) by mouth 2 (two) times daily., Disp: 135 tablet, Rfl: 3 .  torsemide (DEMADEX) 20 MG tablet, Take 3 tablets (60 mg total) by mouth 2 (two) times daily. May take an extra 20 mg Daily for weight gain above 212 lbs, Disp: 190 tablet, Rfl: 3 .  nitroGLYCERIN (NITROSTAT) 0.4 MG SL tablet, Place 1 tablet (0.4 mg total) under the tongue every 5 (five) minutes as needed for chest pain., Disp: 30 tablet, Rfl: 12 Allergies  Allergen Reactions  . No Known Allergies      Social History   Social History  . Marital status: Single    Spouse name: N/A  . Number of children: N/A  . Years of  education: N/A   Occupational History  . Not on file.   Social History Main Topics  . Smoking status: Former Smoker    Packs/day: 1.00    Years: 46.00    Types: Cigarettes    Start date: 02/04/1967    Quit date: 02/03/2013  . Smokeless tobacco: Never Used     Comment: Doing well with quitting.  . Alcohol use No  . Drug use: No  . Sexual activity: Not Currently   Other Topics Concern  . Not on file   Social History Narrative   Lives with sister, [name] in [place].  Retired - former Sports coach.    Physical Exam  Pulmonary/Chest: No respiratory distress. He has no wheezes. He has no rales.  Abdominal: Soft. He exhibits no distension. There is no tenderness. There is no guarding.  Musculoskeletal: He exhibits edema.  Skin: Skin is warm and dry. He is not diaphoretic.        Future Appointments Date Time Provider [place]  06/19/2016 2:45 PM [facility] 4 [facility] None  06/19/2016 3:15 PM [facility] INJ NURSE [facility] None  07/03/2016 9:30 AM Hayden Pedro, MD [facility] None  07/03/2016 2:45 PM [facility] ONLY [facility] None  07/03/2016 3:15 PM [facility] INJ NURSE [facility] None  07/17/2016 2:45 PM [facility] 2 [facility] None  07/17/2016 3:15 PM [facility] INJ NURSE [facility] None  07/23/2016 11:15 AM Gardiner Barefoot, DPM [facility]  07/31/2016 2:45 PM [facility] LAB 5 [facility] None  07/31/2016 3:15 PM [facility] INJ NURSE [facility] None  08/14/2016 2:45 PM [facility] LAB 5 [facility] None  08/14/2016 3:15 PM [facility] INJ NURSE [facility] None  08/28/2016 2:45 PM [facility] LAB 2 [facility] None  08/28/2016 3:15 PM [facility] INJ NURSE [facility] None  09/02/2016 10:30 AM [facility] PA/NP [facility] None  09/11/2016 2:45 PM [facility] LAB 5 [facility] None  09/11/2016 3:15 PM [facility] INJ NURSE [facility] None  09/25/2016 2:45 PM [facility] LAB 5 [facility] None  09/25/2016 3:15 PM [facility] INJ NURSE [facility]  None  10/09/2016 2:45 PM [facility] LAB 5 [facility] None  10/09/2016 3:15 PM [facility] INJ NURSE [facility] None  10/23/2016 2:45 PM [facility] LAB 2 [facility] None  10/23/2016 3:15 PM [facility] INJ NURSE [facility] None  11/06/2016 2:45 PM [facility] LAB 5 [facility] None  11/06/2016 3:15 PM [facility] INJ NURSE [facility] None  11/20/2016 2:45 PM [facility] LAB 6 [facility] None  11/20/2016 3:15 PM Gorsuch, Ni, MD [facility] None  11/20/2016 3:45 PM [facility] INJ NURSE [facility] None  12/19/2016 2:00 PM [facility] VASC 3 [facility]  12/19/2016 2:45 PM Nickel, Sharmon Leyden, NP [facility]    ATF pt CAO x4 sitting outside. Pt stated that he feels good and has no complaints. pt ate "pork skins" earlier today and his throat hurts now  when he burps. He stated that it was a little spicy.  Pt is aware that he should not eat "pork skins". There was an [employer] bag on the table in front of him. Pt became agitated during assessment. Pt denies sob, dizziness, headache and chest pain.  Pt missed evening doses for fri and sat pt has edema in both lower legs. Their home is still under construction but they are still able to live in the apartment.   rx bottles verified and pill box refilled.   BP 136/60 (BP Location: Right Arm, Patient Position: Sitting, Cuff Size: Normal)   Pulse 63   Resp 16   Wt 207 lb (93.9 kg)   SpO2 98%   BMI 31.47 kg/m   cbg 296  Weight yesterday-207 Last visit weight-209   Mylani Gentry, EMT Paramedic 06/17/2016    ACTION: Home visit completed Next visit planned for next wednesday

## 2016-06-19 ENCOUNTER — Other Ambulatory Visit (HOSPITAL_BASED_OUTPATIENT_CLINIC_OR_DEPARTMENT_OTHER): Payer: Medicare Other

## 2016-06-19 ENCOUNTER — Ambulatory Visit (HOSPITAL_BASED_OUTPATIENT_CLINIC_OR_DEPARTMENT_OTHER): Payer: Medicare Other

## 2016-06-19 VITALS — BP 119/64 | HR 66 | Temp 98.3°F | Resp 18

## 2016-06-19 DIAGNOSIS — D631 Anemia in chronic kidney disease: Secondary | ICD-10-CM

## 2016-06-19 DIAGNOSIS — N184 Chronic kidney disease, stage 4 (severe): Secondary | ICD-10-CM

## 2016-06-19 LAB — CBC WITH DIFFERENTIAL/PLATELET
BASO%: 0.6 % (ref 0.0–2.0)
Basophils Absolute: 0 10*3/uL (ref 0.0–0.1)
EOS%: 7.3 % — AB (ref 0.0–7.0)
Eosinophils Absolute: 0.4 10*3/uL (ref 0.0–0.5)
HEMATOCRIT: 31.9 % — AB (ref 38.4–49.9)
HEMOGLOBIN: 10 g/dL — AB (ref 13.0–17.1)
LYMPH#: 0.9 10*3/uL (ref 0.9–3.3)
LYMPH%: 15.4 % (ref 14.0–49.0)
MCH: 24.7 pg — ABNORMAL LOW (ref 27.2–33.4)
MCHC: 31.6 g/dL — ABNORMAL LOW (ref 32.0–36.0)
MCV: 78.4 fL — ABNORMAL LOW (ref 79.3–98.0)
MONO#: 0.6 10*3/uL (ref 0.1–0.9)
MONO%: 9.6 % (ref 0.0–14.0)
NEUT#: 4 10*3/uL (ref 1.5–6.5)
NEUT%: 67.1 % (ref 39.0–75.0)
Platelets: 195 10*3/uL (ref 140–400)
RBC: 4.06 10*6/uL — ABNORMAL LOW (ref 4.20–5.82)
RDW: 21 % — AB (ref 11.0–14.6)
WBC: 6 10*3/uL (ref 4.0–10.3)

## 2016-06-19 MED ORDER — DARBEPOETIN ALFA 500 MCG/ML IJ SOSY
500.0000 ug | PREFILLED_SYRINGE | Freq: Once | INTRAMUSCULAR | Status: AC
  Administered 2016-06-19: 500 ug via SUBCUTANEOUS
  Filled 2016-06-19: qty 1

## 2016-06-19 NOTE — Patient Instructions (Signed)

## 2016-06-24 ENCOUNTER — Other Ambulatory Visit (HOSPITAL_COMMUNITY): Payer: Self-pay

## 2016-06-24 NOTE — Progress Notes (Signed)
Paramedicine Encounter    Patient ID: Leonard [name], male    DOB: [birth year], 66 y.o.   MRN: [id number]    Patient Care Team: [name], MD as PCP - General (Family Medicine) [name], MD as Consulting Physician (Nephrology)  Patient Active Problem List   Diagnosis Date Noted  . Pleural effusion   . Oxygen desaturation   . Morbid obesity due to excess calories ([place]) 03/17/2016  . Anemia due to stage 4 chronic kidney disease treated with darbepoetin ([place]) 02/28/2016  . Fatigue 01/21/2016  . Diabetic retinopathy ([place]) 11/13/2015  . Dyspnea 08/28/2015  . CHF (congestive heart failure) ([place]) 08/27/2015  . Iron deficiency anemia 08/03/2015  . Claudication ([place])   . Type 2 diabetes mellitus with stage 3 chronic kidney disease, with long-term current use of insulin ([place])   . Secondary hypertension   . Hematuria 06/27/2015  . Skin lesion of face 02/22/2015  . 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 Deficiency anemia 01/15/2015  . CKD (chronic kidney disease) stage 4, GFR 15-29 ml/min (HCC) 01/15/2015  . Hypothyroidism 11/22/2014  . Acute on chronic diastolic congestive heart failure ([place]) 07/04/2014  . COPD GOLD III  02/16/2014  . History of tobacco use 08/23/2013  . 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 CAD (coronary artery disease) 04/06/2013  . PVD - hx of Rt SFA PTA and s/p Rt 4-5th toe amp Dec 2014 04/05/2013  . Essential hypertension 11/25/2012  . Mixed hyperlipidemia 07/17/2008    Current Outpatient Prescriptions:  .  amLODipine (NORVASC) 10 MG tablet, Take 1 tablet (10 mg total) by mouth daily., Disp: 30 tablet, Rfl: 6 .  aspirin 81 MG tablet, TAKE 81 MG BY MOUTH EVERY DAY, Disp: 30 tablet, Rfl: 3 .  atorvastatin (LIPITOR) 40 MG tablet, TAKE 1 TABLET BY MOUTH EVERY DAY AT 6 PM (Patient taking differently: Take 40 mg by mouth daily. ), Disp: 90 tablet, Rfl: 3 .  carvedilol (COREG) 25 MG tablet, Take 2 tablets (50 mg total) by mouth 2  (two) times daily with a meal., Disp: 120 tablet, Rfl: 3 .  cloNIDine (CATAPRES) 0.2 MG tablet, Take 2 tablets (0.4 mg total) by mouth 2 (two) times daily., Disp: 120 tablet, Rfl: 3 .  doxazosin (CARDURA) 8 MG tablet, Take 1 tablet (8 mg total) by mouth 2 (two) times daily., Disp: 60 tablet, Rfl: 6 .  ferrous sulfate (FERROUSUL) 325 (65 FE) MG tablet, Take 1 tablet (325 mg total) by mouth 2 (two) times daily with a meal., Disp: 60 tablet, Rfl: 3 .  hydrALAZINE (APRESOLINE) 100 MG tablet, TAKE ONE TABLET BY MOUTH EVERY EIGHT HOURS (Patient taking differently: Take 100 mg by mouth every 8 (eight) hours. ), Disp: 90 tablet, Rfl: 3 .  insulin aspart (NOVOLOG FLEXPEN) 100 UNIT/ML FlexPen, Inject 12 Units into the skin 3 (three) times daily with meals., Disp: , Rfl:  .  Insulin Glargine (LANTUS SOLOSTAR) 100 UNIT/ML Solostar Pen, Inject 40 Units into the skin at bedtime., Disp: 15 mL, Rfl: 5 .  isosorbide mononitrate (IMDUR) 60 MG 24 hr tablet, Take 1 tablet (60 mg total) by mouth daily., Disp: 30 tablet, Rfl: 3 .  levothyroxine (SYNTHROID, LEVOTHROID) 50 MCG tablet, TAKE 1 TABLET BY MOUTH EVERYDAY BEFORE BREAKFAST, Disp: 30 tablet, Rfl: 2 .  pantoprazole (PROTONIX) 40 MG tablet, TAKE ONE (1) TABLET BY MOUTH EVERY DAY, Disp: 30 tablet, Rfl: 3 .  potassium chloride SA (K-DUR,KLOR-CON) 20 MEQ tablet, Take 1 tablet (20  mEq total) by mouth 2 (two) times daily., Disp: 135 tablet, Rfl: 3 .  torsemide (DEMADEX) 20 MG tablet, Take 3 tablets (60 mg total) by mouth 2 (two) times daily. May take an extra 20 mg Daily for weight gain above 212 lbs, Disp: 190 tablet, Rfl: 3 .  nitroGLYCERIN (NITROSTAT) 0.4 MG SL tablet, Place 1 tablet (0.4 mg total) under the tongue every 5 (five) minutes as needed for chest pain., Disp: 30 tablet, Rfl: 12 Allergies  Allergen Reactions  . No Known Allergies      Social History   Social History  . Marital status: Single    Spouse name: N/A  . Number of children: N/A  . Years of  education: N/A   Occupational History  . Not on file.   Social History Main Topics  . Smoking status: Former Smoker    Packs/day: 1.00    Years: 46.00    Types: Cigarettes    Start date: 02/04/1967    Quit date: 02/03/2013  . Smokeless tobacco: Never Used     Comment: Doing well with quitting.  . Alcohol use No  . Drug use: No  . Sexual activity: Not Currently   Other Topics Concern  . Not on file   Social History Narrative   Lives with sister, [name] in [place].  Retired - former Sports coach.    Physical Exam  Eyes: Pupils are equal, round, and reactive to light.  Pulmonary/Chest: No respiratory distress. He has no wheezes. He has no rales.  Abdominal: He exhibits no distension. There is no tenderness. There is no guarding.  Musculoskeletal: He exhibits no edema.  Skin: Skin is warm and dry. He is not diaphoretic.        Future Appointments Date Time Provider [place]  07/03/2016 9:30 AM Hayden Pedro, MD [facility] None  07/03/2016 2:45 PM [facility] ONLY [facility] None  07/03/2016 3:15 PM [facility] INJ NURSE [facility] None  07/17/2016 2:45 PM [facility] LAB 2 [facility] None  07/17/2016 3:15 PM [facility] INJ NURSE [facility] None  07/23/2016 11:15 AM Gardiner Barefoot, DPM [facility]  07/31/2016 2:45 PM [facility] LAB 5 [facility] None  07/31/2016 3:15 PM [facility] INJ NURSE [facility] None  08/14/2016 2:45 PM [facility] LAB 5 [facility] None  08/14/2016 3:15 PM [facility] INJ NURSE [facility] None  08/28/2016 2:45 PM [facility] LAB 2 [facility] None  08/28/2016 3:15 PM [facility] INJ NURSE [facility] None  09/02/2016 10:30 AM [facility] PA/NP [facility] None  09/11/2016 2:45 PM [facility] LAB 5 [facility] None  09/11/2016 3:15 PM [facility] INJ NURSE [facility] None  09/25/2016 2:45 PM [facility] LAB 5 [facility] None  09/25/2016 3:15 PM [facility] INJ NURSE [facility] None  10/09/2016 2:45 PM [facility] LAB 5 [facility] None   10/09/2016 3:15 PM [facility] INJ NURSE [facility] None  10/23/2016 2:45 PM [facility] LAB 2 [facility] None  10/23/2016 3:15 PM [facility] INJ NURSE [facility] None  11/06/2016 2:45 PM [facility] LAB 5 [facility] None  11/06/2016 3:15 PM [facility] INJ NURSE [facility] None  11/20/2016 2:45 PM [facility] LAB 6 [facility] None  11/20/2016 3:15 PM Gorsuch, Ni, MD [facility] None  11/20/2016 3:45 PM [facility] INJ NURSE [facility] None  12/19/2016 2:00 PM [facility] VASC 3 [facility]  12/19/2016 2:45 PM Nickel, Sharmon Leyden, NP [facility]    ATF pt CAO x4 sitting outside on the porch. He asked me to rescheduled GI appointment for tomorrow until 6/13. His next appointment is 6/13 @945 .  Pt hasn't weighed himself because he can't find  the new scale that I gave him a couple of weeks ago due to his apartment being remodeled. He's not using the scale provided to him from his doctors office either.  Pt  Was advised to weigh daily and he said that he will after he finds his scale.  pt ate chinesse chicken and rice with hot sauce for dinner last night.  And he told me that he ate "everything bad this weekend".  I asked pt why does he continue to eat badly and he stated "i don't know".   He denies sob, dizziness, headache and chest pain.  Pt missed one evening dose of rx on Sunday.  rx bottles verified and pill box refilled. Pt took insulin after I checked his cbg,he stated that he has been eating ice cream today.  His cbg was 300 prior to him eating the ice cream.    **rx called: Isosorbide Pantoprazole  BP (!) 158/68 (BP Location: Right Arm, Patient Position: Sitting, Cuff Size: Normal)   Pulse 80   Resp 16   SpO2 98%  cbg 563  Weight yesterday-didn't weigh Last visit weight-207    Robynne Roat, EMT Paramedic 06/24/2016   ACTION: Home visit completed

## 2016-06-25 ENCOUNTER — Telehealth: Payer: Self-pay | Admitting: Family Medicine

## 2016-06-25 NOTE — Telephone Encounter (Signed)
Contacted ann with [facility] about message below. Katharina Caper, Derreck Wiltsey D, [name]

## 2016-06-25 NOTE — Telephone Encounter (Signed)
Leonard [name] with [facility] called about the form for telemonitoring. It was discontinued may 18 but cannot be continued until the form is signed and returned. Please advise

## 2016-06-26 NOTE — Telephone Encounter (Signed)
I filled this form out and faxed it earlier this week. Please let me know if they did not receive it. Thanks.

## 2016-06-27 NOTE — Telephone Encounter (Signed)
Will fill out forms again and fax back when I receive them.

## 2016-06-27 NOTE — Telephone Encounter (Signed)
Leonard [name] says she has never received the form. Please refax it to her at [phone]

## 2016-06-27 NOTE — Telephone Encounter (Signed)
I looked in recently faxed files from 5/21-5/24. Did not see fax. LVM for Stephanie at [facility] asking her to refax form. Ottis Stain, CMA

## 2016-07-01 ENCOUNTER — Other Ambulatory Visit (HOSPITAL_COMMUNITY): Payer: Self-pay

## 2016-07-01 ENCOUNTER — Telehealth: Payer: Self-pay | Admitting: Licensed Clinical Social Worker

## 2016-07-01 ENCOUNTER — Encounter: Payer: Self-pay | Admitting: Family Medicine

## 2016-07-01 ENCOUNTER — Ambulatory Visit (INDEPENDENT_AMBULATORY_CARE_PROVIDER_SITE_OTHER): Payer: Medicare Other | Admitting: Family Medicine

## 2016-07-01 VITALS — BP 134/54 | HR 76 | Temp 98.1°F | Wt 206.0 lb

## 2016-07-01 DIAGNOSIS — Z794 Long term (current) use of insulin: Secondary | ICD-10-CM | POA: Diagnosis not present

## 2016-07-01 DIAGNOSIS — R739 Hyperglycemia, unspecified: Secondary | ICD-10-CM | POA: Diagnosis not present

## 2016-07-01 DIAGNOSIS — I779 Disorder of arteries and arterioles, unspecified: Secondary | ICD-10-CM

## 2016-07-01 DIAGNOSIS — E1122 Type 2 diabetes mellitus with diabetic chronic kidney disease: Secondary | ICD-10-CM | POA: Diagnosis not present

## 2016-07-01 DIAGNOSIS — N183 Chronic kidney disease, stage 3 (moderate): Secondary | ICD-10-CM | POA: Diagnosis not present

## 2016-07-01 LAB — GLUCOSE, POCT (MANUAL RESULT ENTRY): POC GLUCOSE: 415 mg/dL — AB (ref 70–99)

## 2016-07-01 LAB — POCT GLYCOSYLATED HEMOGLOBIN (HGB A1C): HEMOGLOBIN A1C: 11.1

## 2016-07-01 MED ORDER — INSULIN ASPART 100 UNIT/ML ~~LOC~~ SOLN
10.0000 [IU] | Freq: Once | SUBCUTANEOUS | Status: AC
  Administered 2016-07-01: 10 [IU] via SUBCUTANEOUS

## 2016-07-01 NOTE — Progress Notes (Signed)
    Subjective:  Leonard [name] is a 66 y.o. male who presents to the [facility] today for same day appointment with a chief complaint of hyperglycemia.   HPI:  Hyperglycemia Patient currently on a regimen of 40 units nightly and novolog 12 units three times daily with meals. Patient reports that he has missed several doses of this due to falling asleep at night. This morning, he had a blood glucose in the 500s. He took his 12 units of novolog with improvement to the 400s. Otherwise, he feels well. No polyuria or polydipsia. No nausea or vomiting. No recent illnesses. No fevers or chills.   ROS: Per HPI  PMH: Smoking history reviewed.   Objective:  Physical Exam: BP (!) 134/54   Pulse 76   Temp 98.1 F (36.7 C) (Oral)   Wt 206 lb (93.4 kg)   SpO2 98%   BMI 31.32 kg/m   Gen: NAD, resting comfortably CV: RRR with no murmurs appreciated Pulm: NWOB, CTAB with no crackles, wheezes, or rhonchi GI: Normal bowel sounds present. Soft, Nontender, Nondistended. MSK: no edema, cyanosis, or clubbing noted Skin: warm, dry Neuro: grossly normal, moves all extremities Psych: Normal affect and thought content  Results for orders placed or performed in visit on 07/01/16 (from the past 72 hour(s))  HgB A1c     Status: Abnormal   Collection Time: 07/01/16  1:32 PM  Result Value Ref Range   Hemoglobin A1C 11.1   Glucose (CBG)     Status: Abnormal   Collection Time: 07/01/16  1:32 PM  Result Value Ref Range   POC Glucose 415 (A) 70 - 99 mg/dl   Assessment/Plan:  Type 2 diabetes mellitus with stage 3 chronic kidney disease, with long-term current use of insulin (HCC) Uncontrolled. A1c 11.1 and CBG of 415 today, likely secondary to noncompliance with lantus. Patient not tachycardic or tachypneic today. He was unable to provide a urine specimen prior to needing to the leave the clinic due to his ride having to leave. Stressed the importance of adherence to his insulin regimen. Will check STAT bmet to  rule out DKA and other electrolyte abnormalities. Will give 10 units of novolog while here. Suggested that he switch to AM dosing for his lantus to see if this improves his adherence.  Patient appears well clinically - do not think he needs ED visit or admission. Strict return precautions reviewed. Advised close follow up with PCP for ongoing DM management and follow up within the next 1-2 days if sugars do not improve.   Algis Greenhouse. Jerline Pain, [place] Resident PGY-3 07/01/2016 3:19 PM

## 2016-07-01 NOTE — Assessment & Plan Note (Signed)
Uncontrolled. A1c 11.1 and CBG of 415 today, likely secondary to noncompliance with lantus. Patient not tachycardic or tachypneic today. He was unable to provide a urine specimen prior to needing to the leave the clinic due to his ride having to leave. Stressed the importance of adherence to his insulin regimen. Will check STAT bmet to rule out DKA and other electrolyte abnormalities. Will give 10 units of novolog while here. Suggested that he switch to AM dosing for his lantus to see if this improves his adherence.  Patient appears well clinically - do not think he needs ED visit or admission. Strict return precautions reviewed. Advised close follow up with PCP for ongoing DM management and follow up within the next 1-2 days if sugars do not improve.

## 2016-07-01 NOTE — Patient Instructions (Signed)
Please take your novolog as you do normally.  Start the lantus (long acting insulin) tomorrow morning.  Call us if your sugars are still above 300.  We will let you know the results of your blood work.   Come back to see your regular doctor soon.   Let us know if you start having nausea, vomiting, headaches, lightheadedness, etc.  Take care,  Dr Jerline Pain

## 2016-07-01 NOTE — Progress Notes (Signed)
LCSW informed by front office staff Raquel Sarna that EMS is at patient's house and he needs to come to doctor's office for same day appointment.   Patient utilizes [employer] to appointments, which requires a 3 day advanced notice.  No other transportation options available at this time.  LCSW arranged for [employer] taxi to pick up patient and transport to appointment.  Called patient's sister [name] to inform patient, Assunta Gambles would pick him up at 1:15. Taxi voucher faxed to [employer].  Casimer Lanius, LCSW Licensed Clinical Social Worker [facility]   [phone] 12:33 PM

## 2016-07-01 NOTE — Progress Notes (Signed)
Paramedicine Encounter    Patient ID: Leonard [name], male    DOB: [birth year], 66 y.o.   MRN: [id number]   Patient Care Team: [name], MD as PCP - General (Family Medicine) [name], MD as Consulting Physician (Nephrology)  Patient Active Problem List   Diagnosis Date Noted  . Pleural effusion   . Oxygen desaturation   . Morbid obesity due to excess calories ([place]) 03/17/2016  . Anemia due to stage 4 chronic kidney disease treated with darbepoetin ([place]) 02/28/2016  . Fatigue 01/21/2016  . Diabetic retinopathy ([place]) 11/13/2015  . Dyspnea 08/28/2015  . CHF (congestive heart failure) ([place]) 08/27/2015  . Iron deficiency anemia 08/03/2015  . Claudication ([place])   . Type 2 diabetes mellitus with stage 3 chronic kidney disease, with long-term current use of insulin ([place])   . Secondary hypertension   . Hematuria 06/27/2015  . Skin lesion of face 02/22/2015  . 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 Deficiency anemia 01/15/2015  . CKD (chronic kidney disease) stage 4, GFR 15-29 ml/min (HCC) 01/15/2015  . Hypothyroidism 11/22/2014  . Acute on chronic diastolic congestive heart failure ([place]) 07/04/2014  . COPD GOLD III  02/16/2014  . History of tobacco use 08/23/2013  . 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 CAD (coronary artery disease) 04/06/2013  . PVD - hx of Rt SFA PTA and s/p Rt 4-5th toe amp Dec 2014 04/05/2013  . Essential hypertension 11/25/2012  . Mixed hyperlipidemia 07/17/2008    Current Outpatient Prescriptions:  .  amLODipine (NORVASC) 10 MG tablet, Take 1 tablet (10 mg total) by mouth daily., Disp: 30 tablet, Rfl: 6 .  aspirin 81 MG tablet, TAKE 81 MG BY MOUTH EVERY DAY, Disp: 30 tablet, Rfl: 3 .  atorvastatin (LIPITOR) 40 MG tablet, TAKE 1 TABLET BY MOUTH EVERY DAY AT 6 PM (Patient taking differently: Take 40 mg by mouth daily. ), Disp: 90 tablet, Rfl: 3 .  carvedilol (COREG) 25 MG tablet, Take 2 tablets (50 mg total) by mouth 2  (two) times daily with a meal., Disp: 120 tablet, Rfl: 3 .  cloNIDine (CATAPRES) 0.2 MG tablet, Take 2 tablets (0.4 mg total) by mouth 2 (two) times daily., Disp: 120 tablet, Rfl: 3 .  doxazosin (CARDURA) 8 MG tablet, Take 1 tablet (8 mg total) by mouth 2 (two) times daily., Disp: 60 tablet, Rfl: 6 .  ferrous sulfate (FERROUSUL) 325 (65 FE) MG tablet, Take 1 tablet (325 mg total) by mouth 2 (two) times daily with a meal., Disp: 60 tablet, Rfl: 3 .  hydrALAZINE (APRESOLINE) 100 MG tablet, TAKE ONE TABLET BY MOUTH EVERY EIGHT HOURS (Patient taking differently: Take 100 mg by mouth every 8 (eight) hours. ), Disp: 90 tablet, Rfl: 3 .  insulin aspart (NOVOLOG FLEXPEN) 100 UNIT/ML FlexPen, Inject 12 Units into the skin 3 (three) times daily with meals., Disp: , Rfl:  .  Insulin Glargine (LANTUS SOLOSTAR) 100 UNIT/ML Solostar Pen, Inject 40 Units into the skin at bedtime., Disp: 15 mL, Rfl: 5 .  isosorbide mononitrate (IMDUR) 60 MG 24 hr tablet, Take 1 tablet (60 mg total) by mouth daily., Disp: 30 tablet, Rfl: 3 .  levothyroxine (SYNTHROID, LEVOTHROID) 50 MCG tablet, TAKE 1 TABLET BY MOUTH EVERYDAY BEFORE BREAKFAST, Disp: 30 tablet, Rfl: 2 .  pantoprazole (PROTONIX) 40 MG tablet, TAKE ONE (1) TABLET BY MOUTH EVERY DAY, Disp: 30 tablet, Rfl: 3 .  potassium chloride SA (K-DUR,KLOR-CON) 20 MEQ tablet, Take 1 tablet (20 mEq  total) by mouth 2 (two) times daily., Disp: 135 tablet, Rfl: 3 .  torsemide (DEMADEX) 20 MG tablet, Take 3 tablets (60 mg total) by mouth 2 (two) times daily. May take an extra 20 mg Daily for weight gain above 212 lbs, Disp: 190 tablet, Rfl: 3 .  nitroGLYCERIN (NITROSTAT) 0.4 MG SL tablet, Place 1 tablet (0.4 mg total) under the tongue every 5 (five) minutes as needed for chest pain. (Patient not taking: Reported on 07/01/2016), Disp: 30 tablet, Rfl: 12 Allergies  Allergen Reactions  . No Known Allergies      Social History   Social History  . Marital status: Single    Spouse name:  N/A  . Number of children: N/A  . Years of education: N/A   Occupational History  . Not on file.   Social History Main Topics  . Smoking status: Former Smoker    Packs/day: 1.00    Years: 46.00    Types: Cigarettes    Start date: 02/04/1967    Quit date: 02/03/2013  . Smokeless tobacco: Never Used     Comment: Doing well with quitting.  . Alcohol use No  . Drug use: No  . Sexual activity: Not Currently   Other Topics Concern  . Not on file   Social History Narrative   Lives with sister, [name] in [place].  Retired - former Sports coach.    Physical Exam      Future Appointments Date Time Provider [place]  07/01/2016 1:45 PM Vivi Barrack, MD [facility]  07/03/2016 9:30 AM Hayden Pedro, MD [facility] None  07/03/2016 2:45 PM [facility] LAB ONLY [facility] None  07/03/2016 3:15 PM [facility] INJ NURSE [facility] None  07/17/2016 2:45 PM [facility] LAB 2 [facility] None  07/17/2016 3:15 PM [facility] INJ NURSE [facility] None  07/23/2016 11:15 AM Gardiner Barefoot, DPM [facility]  07/31/2016 2:45 PM [facility] LAB 5 [facility] None  07/31/2016 3:15 PM [facility] INJ NURSE [facility] None  08/14/2016 2:45 PM [facility] LAB 5 [facility] None  08/14/2016 3:15 PM [facility] INJ NURSE [facility] None  08/28/2016 2:45 PM [facility] LAB 2 [facility] None  08/28/2016 3:15 PM [facility] INJ NURSE [facility] None  09/02/2016 10:30 AM [facility] PA/NP [facility] None  09/11/2016 2:45 PM [facility] LAB 5 [facility] None  09/11/2016 3:15 PM [facility] INJ NURSE [facility] None  09/25/2016 2:45 PM [facility] LAB 5 [facility] None  09/25/2016 3:15 PM [facility] INJ NURSE [facility] None  10/09/2016 2:45 PM [facility] LAB 5 [facility] None  10/09/2016 3:15 PM [facility] INJ NURSE [facility] None  10/23/2016 2:45 PM [facility] LAB 2 [facility] None  10/23/2016 3:15 PM [facility] INJ NURSE [facility] None  11/06/2016 2:45 PM [facility] LAB 5 [facility]  None  11/06/2016 3:15 PM [facility] INJ NURSE [facility] None  11/20/2016 2:45 PM [facility] LAB 6 [facility] None  11/20/2016 3:15 PM Gorsuch, Ni, MD [facility] None  11/20/2016 3:45 PM [facility] INJ NURSE [facility] None  12/19/2016 2:00 PM [facility] HS VASC 3 [facility]  12/19/2016 2:45 PM Nickel, Sharmon Leyden, NP [facility]   BP (!) 160/70   Pulse 68   Resp 16   Wt 202 lb 8 oz (91.9 kg)   SpO2 99%   BMI 30.79 kg/m  Weight yesterday-207 Last visit weight-207??  CBG PTA-400 CBG EMS-507  Pt reports he just got up about an hour ago-he took his CBG-he did take his insulin but not had anything to eat yet. He has not taken his other meds yet this morning either.  He has missed 3 doses of his noon dose meds last week and missed 3 evening doses of his meds last week. He does not appear to be in any rush fixing himself anything to eat nor taking his meds for today. Pt reports he has had sore throat, he didn't eat yesterday trying to get his CBG down, advised him he needs to eat and be sure to take his insulin as directed-he has missed his insulin 2-3 times the past few nights. He continues to eat poorly. Pt denies any sob, slight dizziness occasionally, he reports a clear/yellow tinged mucous.  He will need carvedilol by next visit. He reports his aide did not show up yesterday and he hasnt come today yet either. He actually just showed up during our visit. meds verified and pill box refilled.  I advised him to go see his PCP about the mucous and his elevated CBG. He request me to call and they are able to see him today at 145-he reports his issue with transportation and I advised the nurse the same and she was able to get him a taxi to be taken up there.  After several times of reminding him to take his morning meds he finally took them at the end of our visit.   ACTION: Home visit completed  Marylouise Stacks, EMT-Paramedic 07/01/16

## 2016-07-02 ENCOUNTER — Telehealth: Payer: Self-pay | Admitting: Family Medicine

## 2016-07-02 LAB — BASIC METABOLIC PANEL
BUN/Creatinine Ratio: 22 (ref 10–24)
BUN: 80 mg/dL (ref 8–27)
CHLORIDE: 99 mmol/L (ref 96–106)
CO2: 27 mmol/L (ref 18–29)
Calcium: 8.9 mg/dL (ref 8.6–10.2)
Creatinine, Ser: 3.67 mg/dL — ABNORMAL HIGH (ref 0.76–1.27)
GFR calc Af Amer: 19 mL/min/{1.73_m2} — ABNORMAL LOW (ref >59)
GFR calc non Af Amer: 16 mL/min/{1.73_m2} — ABNORMAL LOW (ref >59)
GLUCOSE: 424 mg/dL — AB (ref 65–99)
POTASSIUM: 4.9 mmol/L (ref 3.5–5.2)
SODIUM: 131 mmol/L — AB (ref 134–144)

## 2016-07-02 NOTE — Telephone Encounter (Signed)
Called patient to discuss results and check in on sugars. Stated that he was doing well. Sugars down into the 300s. Was able to take his dose of lantus this morning. Discussed his worsening renal function. Advised for him to drink plenty of fluids and follow up at the [facility] within the next few days to recheck his kidney function and blood sugars. Patient voiced understanding and had no further questions.  Algis Greenhouse. Jerline Pain, [place] Resident PGY-3 07/02/2016 12:06 PM

## 2016-07-03 ENCOUNTER — Other Ambulatory Visit (HOSPITAL_BASED_OUTPATIENT_CLINIC_OR_DEPARTMENT_OTHER): Payer: Medicare Other

## 2016-07-03 ENCOUNTER — Encounter (INDEPENDENT_AMBULATORY_CARE_PROVIDER_SITE_OTHER): Payer: Medicaid Other | Admitting: Ophthalmology

## 2016-07-03 ENCOUNTER — Ambulatory Visit (HOSPITAL_BASED_OUTPATIENT_CLINIC_OR_DEPARTMENT_OTHER): Payer: Medicare Other

## 2016-07-03 VITALS — BP 136/61 | HR 62 | Resp 18

## 2016-07-03 DIAGNOSIS — N184 Chronic kidney disease, stage 4 (severe): Secondary | ICD-10-CM

## 2016-07-03 DIAGNOSIS — D631 Anemia in chronic kidney disease: Secondary | ICD-10-CM

## 2016-07-03 DIAGNOSIS — N189 Chronic kidney disease, unspecified: Principal | ICD-10-CM

## 2016-07-03 LAB — CBC WITH DIFFERENTIAL/PLATELET
BASO%: 0.5 % (ref 0.0–2.0)
BASOS ABS: 0 10*3/uL (ref 0.0–0.1)
EOS%: 6.1 % (ref 0.0–7.0)
Eosinophils Absolute: 0.4 10*3/uL (ref 0.0–0.5)
HEMATOCRIT: 31.4 % — AB (ref 38.4–49.9)
HGB: 9.7 g/dL — ABNORMAL LOW (ref 13.0–17.1)
LYMPH#: 0.9 10*3/uL (ref 0.9–3.3)
LYMPH%: 13.9 % — AB (ref 14.0–49.0)
MCH: 24.3 pg — AB (ref 27.2–33.4)
MCHC: 30.9 g/dL — AB (ref 32.0–36.0)
MCV: 78.5 fL — ABNORMAL LOW (ref 79.3–98.0)
MONO#: 0.5 10*3/uL (ref 0.1–0.9)
MONO%: 8.5 % (ref 0.0–14.0)
NEUT#: 4.5 10*3/uL (ref 1.5–6.5)
NEUT%: 71 % (ref 39.0–75.0)
Platelets: 193 10*3/uL (ref 140–400)
RBC: 4 10*6/uL — AB (ref 4.20–5.82)
RDW: 18.5 % — ABNORMAL HIGH (ref 11.0–14.6)
WBC: 6.4 10*3/uL (ref 4.0–10.3)

## 2016-07-03 MED ORDER — DARBEPOETIN ALFA 500 MCG/ML IJ SOSY
500.0000 ug | PREFILLED_SYRINGE | Freq: Once | INTRAMUSCULAR | Status: AC
  Administered 2016-07-03: 500 ug via SUBCUTANEOUS
  Filled 2016-07-03: qty 1

## 2016-07-03 NOTE — Patient Instructions (Signed)

## 2016-07-07 LAB — BASIC METABOLIC PANEL

## 2016-07-08 ENCOUNTER — Other Ambulatory Visit (HOSPITAL_COMMUNITY): Payer: Self-pay

## 2016-07-08 NOTE — Progress Notes (Signed)
Paramedicine Encounter    Patient ID: Leonard [name], male    DOB: [birth year], 66 y.o.   MRN: [id number]   Patient Care Team: [name], MD as PCP - General (Family Medicine) [name], MD as Consulting Physician (Nephrology)  Patient Active Problem List   Diagnosis Date Noted  . Pleural effusion   . Oxygen desaturation   . Morbid obesity due to excess calories ([place]) 03/17/2016  . Anemia due to stage 4 chronic kidney disease treated with darbepoetin ([place]) 02/28/2016  . Fatigue 01/21/2016  . Diabetic retinopathy ([place]) 11/13/2015  . Dyspnea 08/28/2015  . CHF (congestive heart failure) ([place]) 08/27/2015  . Iron deficiency anemia 08/03/2015  . Claudication ([place])   . Type 2 diabetes mellitus with stage 3 chronic kidney disease, with long-term current use of insulin ([place])   . Secondary hypertension   . Hematuria 06/27/2015  . Skin lesion of face 02/22/2015  . 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 Deficiency anemia 01/15/2015  . CKD (chronic kidney disease) stage 4, GFR 15-29 ml/min (HCC) 01/15/2015  . Hypothyroidism 11/22/2014  . Acute on chronic diastolic congestive heart failure ([place]) 07/04/2014  . COPD GOLD III  02/16/2014  . History of tobacco use 08/23/2013  . 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 CAD (coronary artery disease) 04/06/2013  . PVD - hx of Rt SFA PTA and s/p Rt 4-5th toe amp Dec 2014 04/05/2013  . Essential hypertension 11/25/2012  . Mixed hyperlipidemia 07/17/2008    Current Outpatient Prescriptions:  .  amLODipine (NORVASC) 10 MG tablet, Take 1 tablet (10 mg total) by mouth daily., Disp: 30 tablet, Rfl: 6 .  aspirin 81 MG tablet, TAKE 81 MG BY MOUTH EVERY DAY, Disp: 30 tablet, Rfl: 3 .  atorvastatin (LIPITOR) 40 MG tablet, TAKE 1 TABLET BY MOUTH EVERY DAY AT 6 PM (Patient taking differently: Take 40 mg by mouth daily. ), Disp: 90 tablet, Rfl: 3 .  carvedilol (COREG) 25 MG tablet, Take 2 tablets (50 mg total) by mouth 2  (two) times daily with a meal., Disp: 120 tablet, Rfl: 3 .  cloNIDine (CATAPRES) 0.2 MG tablet, Take 2 tablets (0.4 mg total) by mouth 2 (two) times daily., Disp: 120 tablet, Rfl: 3 .  doxazosin (CARDURA) 8 MG tablet, Take 1 tablet (8 mg total) by mouth 2 (two) times daily., Disp: 60 tablet, Rfl: 6 .  ferrous sulfate (FERROUSUL) 325 (65 FE) MG tablet, Take 1 tablet (325 mg total) by mouth 2 (two) times daily with a meal., Disp: 60 tablet, Rfl: 3 .  hydrALAZINE (APRESOLINE) 100 MG tablet, TAKE ONE TABLET BY MOUTH EVERY EIGHT HOURS (Patient taking differently: Take 100 mg by mouth every 8 (eight) hours. ), Disp: 90 tablet, Rfl: 3 .  insulin aspart (NOVOLOG FLEXPEN) 100 UNIT/ML FlexPen, Inject 12 Units into the skin 3 (three) times daily with meals., Disp: , Rfl:  .  Insulin Glargine (LANTUS SOLOSTAR) 100 UNIT/ML Solostar Pen, Inject 40 Units into the skin at bedtime., Disp: 15 mL, Rfl: 5 .  isosorbide mononitrate (IMDUR) 60 MG 24 hr tablet, Take 1 tablet (60 mg total) by mouth daily., Disp: 30 tablet, Rfl: 3 .  levothyroxine (SYNTHROID, LEVOTHROID) 50 MCG tablet, TAKE 1 TABLET BY MOUTH EVERYDAY BEFORE BREAKFAST, Disp: 30 tablet, Rfl: 2 .  pantoprazole (PROTONIX) 40 MG tablet, TAKE ONE (1) TABLET BY MOUTH EVERY DAY, Disp: 30 tablet, Rfl: 3 .  potassium chloride SA (K-DUR,KLOR-CON) 20 MEQ tablet, Take 1 tablet (20 mEq  total) by mouth 2 (two) times daily., Disp: 135 tablet, Rfl: 3 .  torsemide (DEMADEX) 20 MG tablet, Take 3 tablets (60 mg total) by mouth 2 (two) times daily. May take an extra 20 mg Daily for weight gain above 212 lbs, Disp: 190 tablet, Rfl: 3 .  nitroGLYCERIN (NITROSTAT) 0.4 MG SL tablet, Place 1 tablet (0.4 mg total) under the tongue every 5 (five) minutes as needed for chest pain. (Patient not taking: Reported on 07/01/2016), Disp: 30 tablet, Rfl: 12 Allergies  Allergen Reactions  . No Known Allergies      Social History   Social History  . Marital status: Single    Spouse name:  N/A  . Number of children: N/A  . Years of education: N/A   Occupational History  . Not on file.   Social History Main Topics  . Smoking status: Former Smoker    Packs/day: 1.00    Years: 46.00    Types: Cigarettes    Start date: 02/04/1967    Quit date: 02/03/2013  . Smokeless tobacco: Never Used     Comment: Doing well with quitting.  . Alcohol use No  . Drug use: No  . Sexual activity: Not Currently   Other Topics Concern  . Not on file   Social History Narrative   Lives with sister, [name] in [place].  Retired - former Sports coach.    Physical Exam      Future Appointments Date Time Provider [place]  07/17/2016 2:45 PM [facility] LAB 2 [facility] None  07/17/2016 3:15 PM [facility] FLUSH NURSE 2 [facility] None  07/23/2016 11:15 AM Gardiner Barefoot, DPM [facility]  07/31/2016 2:45 PM [facility] LAB 5 [facility] None  07/31/2016 3:15 PM [facility] INJ NURSE [facility] None  08/14/2016 2:45 PM [facility] LAB 5 [facility] None  08/14/2016 3:15 PM [facility] INJ NURSE [facility] None  08/28/2016 2:45 PM [facility] LAB 2 [facility] None  08/28/2016 3:15 PM [facility] INJ NURSE [facility] None  09/02/2016 10:30 AM [facility] PA/NP [facility] None  09/11/2016 2:45 PM [facility] LAB 5 [facility] None  09/11/2016 3:15 PM [facility] INJ NURSE [facility] None  09/25/2016 2:45 PM [facility] LAB 5 [facility] None  09/25/2016 3:15 PM [facility] INJ NURSE [facility] None  10/09/2016 2:45 PM [facility] LAB 5 [facility] None  10/09/2016 3:15 PM [facility] INJ NURSE [facility] None  10/23/2016 2:45 PM [facility] LAB 2 [facility] None  10/23/2016 3:15 PM [facility] INJ NURSE [facility] None  11/06/2016 2:45 PM [facility] LAB 5 [facility] None  11/06/2016 3:15 PM [facility] INJ NURSE [facility] None  11/20/2016 2:45 PM [facility] LAB 6 [facility] None  11/20/2016 3:15 PM Gorsuch, Ni, MD [facility] None  11/20/2016 3:45 PM [facility] INJ NURSE  [facility] None  12/19/2016 2:00 PM [facility] HS VASC 3 [facility] V[facility]  12/19/2016 2:45 PM Nickel, Sharmon Leyden, NP [facility]   BP (!) 142/64   Pulse 70   Resp 18   Wt 206 lb (93.4 kg)   SpO2 97%   BMI 31.32 kg/m  Weight yesterday-204 Last visit weight-202 CBG PTA-260 CBG EMS-322  Pt reports he is feeling fair, when I arrived a friend had brought him bojangles--he missed wed om dose, all of fri and sat doses of his pills, and he missed mon pm dose also.  His weight is up 4 lbs from last week, im surprised it isnt more than that considering the numerous doses of his meds he has missed.  He reports his breathing is doing alright.  I asked him what he  thought about the process of dialysis and he said he was going with the flow and with the program- I advised him missing numerous doses of meds and eating the fast food frequently like he is doing is fast forwarding that process.  cbg as noted-he reports he took his lantus this morning but not the short acting one yet. He will take it when he eats his bojangles. He needs refills on meds---will need to call next Monday to see what can be brought out for him by Tuesday.   ACTION: Home visit completed Next visit planned for next week   Marylouise Stacks, EMT-Paramedic 07/08/16

## 2016-07-15 ENCOUNTER — Other Ambulatory Visit (HOSPITAL_COMMUNITY): Payer: Self-pay

## 2016-07-15 ENCOUNTER — Telehealth: Payer: Self-pay | Admitting: Family Medicine

## 2016-07-15 NOTE — Telephone Encounter (Signed)
Called patient regarding high blood glucose level.  Pt stated he took his medications, however did not recheck blood sugar.  Pt denies symptoms. Advised pt to recheck blood sugar and to call clinic back with the result.  Will forward to PCP.  Derl Barrow, RN

## 2016-07-15 NOTE — Telephone Encounter (Signed)
Karena Addison was calling to let PCP know pt's sugars were 435 this morning. Pt had no insulin prior nor had he taken his morning meds. Pt took 10 units of insulin before Dee left. ep

## 2016-07-15 NOTE — Progress Notes (Signed)
Paramedicine Encounter    Patient ID: Leonard [name], male    DOB: 23-Nov-1950, 66 y.o.   MRN: [id number]    Patient Care Team: [name], MD as PCP - General (Family Medicine) [name], MD as Consulting Physician (Nephrology)  Patient Active Problem List   Diagnosis Date Noted  . Pleural effusion   . Oxygen desaturation   . Morbid obesity due to excess calories ([place]) 03/17/2016  . Anemia due to stage 4 chronic kidney disease treated with darbepoetin ([place]) 02/28/2016  . Fatigue 01/21/2016  . Diabetic retinopathy ([place]) 11/13/2015  . Dyspnea 08/28/2015  . CHF (congestive heart failure) ([place]) 08/27/2015  . Iron deficiency anemia 08/03/2015  . Claudication ([place])   . Type 2 diabetes mellitus with stage 3 chronic kidney disease, with long-term current use of insulin ([place])   . Secondary hypertension   . Hematuria 06/27/2015  . Skin lesion of face 02/22/2015  . 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 Deficiency anemia 01/15/2015  . CKD (chronic kidney disease) stage 4, GFR 15-29 ml/min (HCC) 01/15/2015  . Hypothyroidism 11/22/2014  . Acute on chronic diastolic congestive heart failure ([place]) 07/04/2014  . COPD GOLD III  02/16/2014  . History of tobacco use 08/23/2013  . 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 CAD (coronary artery disease) 04/06/2013  . PVD - hx of Rt SFA PTA and s/p Rt 4-5th toe amp Dec 2014 04/05/2013  . Essential hypertension 11/25/2012  . Mixed hyperlipidemia 07/17/2008    Current Outpatient Prescriptions:  .  amLODipine (NORVASC) 10 MG tablet, Take 1 tablet (10 mg total) by mouth daily., Disp: 30 tablet, Rfl: 6 .  aspirin 81 MG tablet, TAKE 81 MG BY MOUTH EVERY DAY, Disp: 30 tablet, Rfl: 3 .  atorvastatin (LIPITOR) 40 MG tablet, TAKE 1 TABLET BY MOUTH EVERY DAY AT 6 PM (Patient taking differently: Take 40 mg by mouth daily. ), Disp: 90 tablet, Rfl: 3 .  carvedilol (COREG) 25 MG tablet, Take 2 tablets (50 mg total) by mouth 2  (two) times daily with a meal., Disp: 120 tablet, Rfl: 3 .  cloNIDine (CATAPRES) 0.2 MG tablet, Take 2 tablets (0.4 mg total) by mouth 2 (two) times daily., Disp: 120 tablet, Rfl: 3 .  doxazosin (CARDURA) 8 MG tablet, Take 1 tablet (8 mg total) by mouth 2 (two) times daily., Disp: 60 tablet, Rfl: 6 .  ferrous sulfate (FERROUSUL) 325 (65 FE) MG tablet, Take 1 tablet (325 mg total) by mouth 2 (two) times daily with a meal., Disp: 60 tablet, Rfl: 3 .  hydrALAZINE (APRESOLINE) 100 MG tablet, TAKE ONE TABLET BY MOUTH EVERY EIGHT HOURS (Patient taking differently: Take 100 mg by mouth every 8 (eight) hours. ), Disp: 90 tablet, Rfl: 3 .  insulin aspart (NOVOLOG FLEXPEN) 100 UNIT/ML FlexPen, Inject 12 Units into the skin 3 (three) times daily with meals., Disp: , Rfl:  .  Insulin Glargine (LANTUS SOLOSTAR) 100 UNIT/ML Solostar Pen, Inject 40 Units into the skin at bedtime., Disp: 15 mL, Rfl: 5 .  isosorbide mononitrate (IMDUR) 60 MG 24 hr tablet, Take 1 tablet (60 mg total) by mouth daily., Disp: 30 tablet, Rfl: 3 .  levothyroxine (SYNTHROID, LEVOTHROID) 50 MCG tablet, TAKE 1 TABLET BY MOUTH EVERYDAY BEFORE BREAKFAST, Disp: 30 tablet, Rfl: 2 .  pantoprazole (PROTONIX) 40 MG tablet, TAKE ONE (1) TABLET BY MOUTH EVERY DAY, Disp: 30 tablet, Rfl: 3 .  potassium chloride SA (K-DUR,KLOR-CON) 20 MEQ tablet, Take 1 tablet (20  mEq total) by mouth 2 (two) times daily., Disp: 135 tablet, Rfl: 3 .  torsemide (DEMADEX) 20 MG tablet, Take 3 tablets (60 mg total) by mouth 2 (two) times daily. May take an extra 20 mg Daily for weight gain above 212 lbs, Disp: 190 tablet, Rfl: 3 .  nitroGLYCERIN (NITROSTAT) 0.4 MG SL tablet, Place 1 tablet (0.4 mg total) under the tongue every 5 (five) minutes as needed for chest pain. (Patient not taking: Reported on 07/01/2016), Disp: 30 tablet, Rfl: 12 Allergies  Allergen Reactions  . No Known Allergies      Social History   Social History  . Marital status: Single    Spouse name:  N/A  . Number of children: N/A  . Years of education: N/A   Occupational History  . Not on file.   Social History Main Topics  . Smoking status: Former Smoker    Packs/day: 1.00    Years: 46.00    Types: Cigarettes    Start date: 02/04/1967    Quit date: 02/03/2013  . Smokeless tobacco: Never Used     Comment: Doing well with quitting.  . Alcohol use No  . Drug use: No  . Sexual activity: Not Currently   Other Topics Concern  . Not on file   Social History Narrative   Lives with sister, [name] in [place].  Retired - former Sports coach.    Physical Exam  Eyes: Pupils are equal, round, and reactive to light.  Pulmonary/Chest: No respiratory distress. He has no wheezes. He has no rales.  Abdominal: He exhibits no distension. There is no tenderness. There is no guarding.  Musculoskeletal: He exhibits edema.  Skin: Skin is warm and dry. He is not diaphoretic.        Future Appointments Date Time Provider [place]  07/17/2016 2:45 PM [facility] LAB 2 [facility] None  07/17/2016 3:15 PM [facility] FLUSH NURSE 2 [facility] None  07/23/2016 11:15 AM Gardiner Barefoot, DPM [facility]  07/31/2016 2:45 PM [facility] LAB 5 [facility] None  07/31/2016 3:15 PM [facility] INJ NURSE [facility] None  08/14/2016 2:45 PM [facility] LAB 5 [facility] None  08/14/2016 3:15 PM [facility] INJ NURSE [facility] None  08/28/2016 2:45 PM [facility] LAB 2 [facility] None  08/28/2016 3:15 PM [facility] INJ NURSE [facility] None  09/02/2016 10:30 AM [facility] PA/NP [facility] None  09/11/2016 2:45 PM [facility] LAB 5 [facility] None  09/11/2016 3:15 PM [facility] INJ NURSE [facility] None  09/25/2016 2:45 PM [facility] LAB 5 [facility] None  09/25/2016 3:15 PM [facility] INJ NURSE [facility] None  10/09/2016 2:45 PM [facility] LAB 5 [facility] None  10/09/2016 3:15 PM [facility] INJ NURSE [facility] None  10/23/2016 2:45 PM [facility] LAB 2 [facility] None   10/23/2016 3:15 PM [facility] INJ NURSE [facility] None  11/06/2016 2:45 PM [facility] LAB 5 [facility] None  11/06/2016 3:15 PM [facility] INJ NURSE [facility] None  11/20/2016 2:45 PM [facility] LAB 6 [facility] None  11/20/2016 3:15 PM Gorsuch, Ni, MD [facility] None  11/20/2016 3:45 PM [facility] INJ NURSE [facility] None  12/19/2016 2:00 PM [facility] HS VASC 3 [facility]  12/19/2016 2:45 PM Nickel, Sharmon Leyden, NP [facility]    ATF pt CAO x4 sitting on the sofa.  He hadn't taken his morning medications today. Pt stated that he is sleeping through the time for his medications.  His sister said that he has been sleeping more than normal and eating all the time.  He has been eating greens and his normal foods that he  like (chicken, pizza, ribs).  Pt hadn't taken his novolog since he ate breakfast.  Pt took 10 units of novolog per his required dose after I checked his cbg.  He has edema in both lower legs (left is bigger than right) +edema in both legs.  He stated that his legs hurt and the top of his feet.  He missed several med doses within the past week (evening, morning and afternoon). rx bottles and pill box refilled..   Dr. Jerline Pain called and notified of pt's cbg and med noncompliance due to his last visit this week.  Dr . Jerline Pain advised pt to call in his cbg readings that were over 400.  CBG 435  BP (!) 150/72 (BP Location: Right Arm, Patient Position: Sitting, Cuff Size: Normal)   Pulse 65   Resp 16   Wt 207 lb 8 oz (94.1 kg)   SpO2 98%   BMI 31.55 kg/m   Weight yesterday-207 Last visit weight-206    Victorio Creeden, EMT Paramedic 07/15/2016    ACTION: Home visit completed

## 2016-07-16 NOTE — Telephone Encounter (Signed)
Spoke to Leonard [name]. His sugar was 250 this am. He does not want to come in. Feels like sugar dropped a lot and he is ok with the number. I informed him I would let the Dr. Gwyndolyn Kaufman and would call him back if need be. Please advise.  Leonard [name], CMA

## 2016-07-16 NOTE — Telephone Encounter (Signed)
If blood sugar continue to be elevated, have patient make an appointment for Diabetes follow up.   Leonard [name]

## 2016-07-17 ENCOUNTER — Ambulatory Visit (HOSPITAL_BASED_OUTPATIENT_CLINIC_OR_DEPARTMENT_OTHER): Payer: Medicare Other

## 2016-07-17 ENCOUNTER — Other Ambulatory Visit (HOSPITAL_BASED_OUTPATIENT_CLINIC_OR_DEPARTMENT_OTHER): Payer: Medicare Other

## 2016-07-17 VITALS — BP 115/54 | HR 63 | Temp 98.1°F | Resp 18 | Ht 68.0 in | Wt 212.7 lb

## 2016-07-17 DIAGNOSIS — I129 Hypertensive chronic kidney disease with stage 1 through stage 4 chronic kidney disease, or unspecified chronic kidney disease: Secondary | ICD-10-CM | POA: Diagnosis not present

## 2016-07-17 DIAGNOSIS — N184 Chronic kidney disease, stage 4 (severe): Secondary | ICD-10-CM | POA: Diagnosis not present

## 2016-07-17 DIAGNOSIS — Z6831 Body mass index (BMI) 31.0-31.9, adult: Secondary | ICD-10-CM | POA: Diagnosis not present

## 2016-07-17 DIAGNOSIS — D472 Monoclonal gammopathy: Secondary | ICD-10-CM | POA: Diagnosis not present

## 2016-07-17 DIAGNOSIS — D631 Anemia in chronic kidney disease: Secondary | ICD-10-CM | POA: Diagnosis not present

## 2016-07-17 DIAGNOSIS — J449 Chronic obstructive pulmonary disease, unspecified: Secondary | ICD-10-CM | POA: Diagnosis not present

## 2016-07-17 DIAGNOSIS — E119 Type 2 diabetes mellitus without complications: Secondary | ICD-10-CM | POA: Diagnosis not present

## 2016-07-17 DIAGNOSIS — I739 Peripheral vascular disease, unspecified: Secondary | ICD-10-CM | POA: Diagnosis not present

## 2016-07-17 DIAGNOSIS — F17201 Nicotine dependence, unspecified, in remission: Secondary | ICD-10-CM | POA: Diagnosis not present

## 2016-07-17 DIAGNOSIS — I5030 Unspecified diastolic (congestive) heart failure: Secondary | ICD-10-CM | POA: Diagnosis not present

## 2016-07-17 DIAGNOSIS — I2510 Atherosclerotic heart disease of native coronary artery without angina pectoris: Secondary | ICD-10-CM | POA: Diagnosis not present

## 2016-07-17 DIAGNOSIS — N2581 Secondary hyperparathyroidism of renal origin: Secondary | ICD-10-CM | POA: Diagnosis not present

## 2016-07-17 LAB — CBC WITH DIFFERENTIAL/PLATELET
BASO%: 0.8 % (ref 0.0–2.0)
BASOS ABS: 0 10*3/uL (ref 0.0–0.1)
EOS%: 3.6 % (ref 0.0–7.0)
Eosinophils Absolute: 0.2 10*3/uL (ref 0.0–0.5)
HEMATOCRIT: 31.5 % — AB (ref 38.4–49.9)
HGB: 9.8 g/dL — ABNORMAL LOW (ref 13.0–17.1)
LYMPH%: 20.3 % (ref 14.0–49.0)
MCH: 23.6 pg — AB (ref 27.2–33.4)
MCHC: 31.1 g/dL — AB (ref 32.0–36.0)
MCV: 76 fL — ABNORMAL LOW (ref 79.3–98.0)
MONO#: 0.5 10*3/uL (ref 0.1–0.9)
MONO%: 8.5 % (ref 0.0–14.0)
NEUT#: 4 10*3/uL (ref 1.5–6.5)
NEUT%: 66.8 % (ref 39.0–75.0)
Platelets: 234 10*3/uL (ref 140–400)
RBC: 4.14 10*6/uL — AB (ref 4.20–5.82)
RDW: 20.7 % — ABNORMAL HIGH (ref 11.0–14.6)
WBC: 6 10*3/uL (ref 4.0–10.3)
lymph#: 1.2 10*3/uL (ref 0.9–3.3)

## 2016-07-17 MED ORDER — DARBEPOETIN ALFA 500 MCG/ML IJ SOSY
500.0000 ug | PREFILLED_SYRINGE | Freq: Once | INTRAMUSCULAR | Status: AC
  Administered 2016-07-17: 500 ug via SUBCUTANEOUS
  Filled 2016-07-17: qty 1

## 2016-07-17 NOTE — Patient Instructions (Signed)

## 2016-07-22 ENCOUNTER — Other Ambulatory Visit (HOSPITAL_COMMUNITY): Payer: Self-pay

## 2016-07-22 NOTE — Progress Notes (Signed)
Paramedicine Encounter    Patient ID: Leonard [name], male    DOB: 07-Oct-1950, 66 y.o.   MRN: [id number]    Patient Care Team: [name], MD as PCP - General (Family Medicine) [name], MD as Consulting Physician (Nephrology)  Patient Active Problem List   Diagnosis Date Noted  . Pleural effusion   . Oxygen desaturation   . Morbid obesity due to excess calories ([place]) 03/17/2016  . Anemia due to stage 4 chronic kidney disease treated with darbepoetin ([place]) 02/28/2016  . Fatigue 01/21/2016  . Diabetic retinopathy ([place]) 11/13/2015  . Dyspnea 08/28/2015  . CHF (congestive heart failure) ([place]) 08/27/2015  . Iron deficiency anemia 08/03/2015  . Claudication ([place])   . Type 2 diabetes mellitus with stage 3 chronic kidney disease, with long-term current use of insulin ([place])   . Secondary hypertension   . Hematuria 06/27/2015  . Skin lesion of face 02/22/2015  . 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 Deficiency anemia 01/15/2015  . CKD (chronic kidney disease) stage 4, GFR 15-29 ml/min (HCC) 01/15/2015  . Hypothyroidism 11/22/2014  . Acute on chronic diastolic congestive heart failure ([place]) 07/04/2014  . COPD GOLD III  02/16/2014  . History of tobacco use 08/23/2013  . 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 CAD (coronary artery disease) 04/06/2013  . PVD - hx of Rt SFA PTA and s/p Rt 4-5th toe amp Dec 2014 04/05/2013  . Essential hypertension 11/25/2012  . Mixed hyperlipidemia 07/17/2008    Current Outpatient Prescriptions:  .  amLODipine (NORVASC) 10 MG tablet, Take 1 tablet (10 mg total) by mouth daily., Disp: 30 tablet, Rfl: 6 .  aspirin 81 MG tablet, TAKE 81 MG BY MOUTH EVERY DAY, Disp: 30 tablet, Rfl: 3 .  atorvastatin (LIPITOR) 40 MG tablet, TAKE 1 TABLET BY MOUTH EVERY DAY AT 6 PM (Patient taking differently: Take 40 mg by mouth daily. ), Disp: 90 tablet, Rfl: 3 .  carvedilol (COREG) 25 MG tablet, Take 2 tablets (50 mg total) by mouth 2  (two) times daily with a meal., Disp: 120 tablet, Rfl: 3 .  cloNIDine (CATAPRES) 0.2 MG tablet, Take 2 tablets (0.4 mg total) by mouth 2 (two) times daily., Disp: 120 tablet, Rfl: 3 .  doxazosin (CARDURA) 8 MG tablet, Take 1 tablet (8 mg total) by mouth 2 (two) times daily., Disp: 60 tablet, Rfl: 6 .  hydrALAZINE (APRESOLINE) 100 MG tablet, TAKE ONE TABLET BY MOUTH EVERY EIGHT HOURS (Patient taking differently: Take 100 mg by mouth every 8 (eight) hours. ), Disp: 90 tablet, Rfl: 3 .  isosorbide mononitrate (IMDUR) 60 MG 24 hr tablet, Take 1 tablet (60 mg total) by mouth daily., Disp: 30 tablet, Rfl: 3 .  levothyroxine (SYNTHROID, LEVOTHROID) 50 MCG tablet, TAKE 1 TABLET BY MOUTH EVERYDAY BEFORE BREAKFAST, Disp: 30 tablet, Rfl: 2 .  pantoprazole (PROTONIX) 40 MG tablet, TAKE ONE (1) TABLET BY MOUTH EVERY DAY, Disp: 30 tablet, Rfl: 3 .  potassium chloride SA (K-DUR,KLOR-CON) 20 MEQ tablet, Take 1 tablet (20 mEq total) by mouth 2 (two) times daily., Disp: 135 tablet, Rfl: 3 .  torsemide (DEMADEX) 20 MG tablet, Take 3 tablets (60 mg total) by mouth 2 (two) times daily. May take an extra 20 mg Daily for weight gain above 212 lbs, Disp: 190 tablet, Rfl: 3 .  ferrous sulfate (FERROUSUL) 325 (65 FE) MG tablet, Take 1 tablet (325 mg total) by mouth 2 (two) times daily with a meal., Disp: 60 tablet,  Rfl: 3 .  insulin aspart (NOVOLOG FLEXPEN) 100 UNIT/ML FlexPen, Inject 12 Units into the skin 3 (three) times daily with meals., Disp: , Rfl:  .  Insulin Glargine (LANTUS SOLOSTAR) 100 UNIT/ML Solostar Pen, Inject 40 Units into the skin at bedtime., Disp: 15 mL, Rfl: 5 .  nitroGLYCERIN (NITROSTAT) 0.4 MG SL tablet, Place 1 tablet (0.4 mg total) under the tongue every 5 (five) minutes as needed for chest pain. (Patient not taking: Reported on 07/01/2016), Disp: 30 tablet, Rfl: 12 Allergies  Allergen Reactions  . No Known Allergies      Social History   Social History  . Marital status: Single    Spouse name:  N/A  . Number of children: N/A  . Years of education: N/A   Occupational History  . Not on file.   Social History Main Topics  . Smoking status: Former Smoker    Packs/day: 1.00    Years: 46.00    Types: Cigarettes    Start date: 02/04/1967    Quit date: 02/03/2013  . Smokeless tobacco: Never Used     Comment: Doing well with quitting.  . Alcohol use No  . Drug use: No  . Sexual activity: Not Currently   Other Topics Concern  . Not on file   Social History Narrative   Lives with sister, [name] in [place].  Retired - former Sports coach.    Physical Exam  Pulmonary/Chest: No respiratory distress. He has no wheezes. He has no rales.  Abdominal: He exhibits no distension. There is no tenderness. There is no guarding.  Musculoskeletal: He exhibits edema.  Skin: Skin is warm and dry. He is not diaphoretic.        Future Appointments Date Time Provider [place]  07/23/2016 11:15 AM Gardiner Barefoot, DPM [facility]  07/31/2016 2:45 PM [facility] 5 [facility] None  07/31/2016 3:15 PM [facility] FLUSH NURSE 2 [facility] None  08/14/2016 2:45 PM [facility] LAB 5 [facility] None  08/14/2016 3:15 PM [facility] INJ NURSE [facility] None  08/28/2016 2:45 PM [facility] LAB 2 [facility] None  08/28/2016 3:15 PM [facility] INJ NURSE [facility] None  09/02/2016 10:30 AM [facility] PA/NP [facility] None  09/11/2016 2:45 PM [facility] LAB 5 [facility] None  09/11/2016 3:15 PM [facility] INJ NURSE [facility] None  09/25/2016 2:45 PM [facility] LAB 5 [facility] None  09/25/2016 3:15 PM [facility] INJ NURSE [facility] None  10/09/2016 2:45 PM [facility] LAB 5 [facility] None  10/09/2016 3:15 PM [facility] INJ NURSE [facility] None  10/23/2016 2:45 PM [facility] LAB 2 [facility] None  10/23/2016 3:15 PM [facility] INJ NURSE [facility] None  11/06/2016 2:45 PM [facility] LAB 5 [facility] None  11/06/2016 3:15 PM [facility] INJ NURSE [facility] None   11/20/2016 2:45 PM [facility] LAB 6 [facility] None  11/20/2016 3:15 PM Gorsuch, Ni, MD [facility] None  11/20/2016 3:45 PM [facility] INJ NURSE [facility] None  12/19/2016 2:00 PM [facility] VASC 3 [facility]  12/19/2016 2:45 PM Nickel, Sharmon Leyden, NP [facility]    ATF pt CAO x4 sitting in the recliner in the living room watching baseball.  Pt hadn't taken his morning medications except insulin; he took them once I got there.  He had just gave himself the shot prior to my arrival.  Pt had eaten a sandwich and two oatmeal cookies this morning.  He denies sob, dizziness, headache and chest pain.   Pt has edema in both lower legs (left leg is larger than right). Pt hasn't missed any medications doses this  week.  rx bottles verified and pill box refilled.    BP 140/70 (BP Location: Right Arm, Patient Position: Sitting)   Pulse 66   Resp 12   Wt 204 lb (92.5 kg)   SpO2 98%   BMI 31.02 kg/m  cbg 379  Weight yesterday-207 Last visit weight-207    Lallie Strahm, EMT Paramedic 07/22/2016    ACTION: Home visit completed Next visit planned for next tues

## 2016-07-23 ENCOUNTER — Encounter: Payer: Self-pay | Admitting: Podiatry

## 2016-07-23 ENCOUNTER — Ambulatory Visit (INDEPENDENT_AMBULATORY_CARE_PROVIDER_SITE_OTHER): Payer: Medicare Other | Admitting: Podiatry

## 2016-07-23 DIAGNOSIS — Q828 Other specified congenital malformations of skin: Secondary | ICD-10-CM

## 2016-07-23 DIAGNOSIS — E1140 Type 2 diabetes mellitus with diabetic neuropathy, unspecified: Secondary | ICD-10-CM

## 2016-07-23 DIAGNOSIS — S98131S Complete traumatic amputation of one right lesser toe, sequela: Secondary | ICD-10-CM

## 2016-07-23 DIAGNOSIS — M79676 Pain in unspecified toe(s): Secondary | ICD-10-CM

## 2016-07-23 DIAGNOSIS — B351 Tinea unguium: Secondary | ICD-10-CM

## 2016-07-23 NOTE — Progress Notes (Signed)
Patient ID: Leonard [name], male   DOB: 04/09/1950, 66 y.o.   MRN: [id number] Complaint:  Visit Type: Patient returns to my office for continued preventative foot care services. Complaint: Patient states" my nails have grown long and thick and become painful to walk and wear shoes" Patient has been diagnosed with DM with amputation 4,5 rays right foot... The patient presents for preventative foot care services. No changes to ROS  Podiatric Exam: Vascular: dorsalis pedis and posterior tibial pulses are not  palpable bilateral. Capillary return is immediate.  Cold feet Absence of hair.  Sensorium: Absent  Semmes Weinstein monofilament test. Normal tactile sensation bilaterally. Nail Exam: Pt has thick disfigured discolored nails with subungual debris noted bilateral entire nail hallux through fifth toenails Ulcer Exam: There is no evidence of ulcer or pre-ulcerative changes or infection. Orthopedic Exam: Muscle tone and strength are WNL. No limitations in general ROM. No crepitus or effusions noted. Foot type and digits show no abnormalities. Bony prominences are unremarkable.Amputation 4,5 digits right foot. Skin: Asymptomatic  Porokeratosis left foot.. No infection or ulcers  Diagnosis:  Onychomycosis, , Pain in right toe, pain in left toes.  Diabetic angiopathy and neuropathy with amputation  Treatment & Plan Procedures and Treatment: Consent by patient was obtained for treatment procedures. The patient understood the discussion of treatment and procedures well. All questions were answered thoroughly reviewed. Debridement of mycotic and hypertrophic toenails, 1 through 5 bilateral and clearing of subungual debris. No ulceration, no infection noted. Requests no drill usage.  Initiate diabetic shoe paperwork for diabetic neuropathy and angiopathy and amputation 4,5 right. Return Visit-Office Procedure: Patient instructed to return to the office for a follow up visit 3 months for continued evaluation  and treatment.    Gardiner Barefoot DPM

## 2016-07-29 ENCOUNTER — Other Ambulatory Visit (HOSPITAL_COMMUNITY): Payer: Self-pay

## 2016-07-29 ENCOUNTER — Telehealth (HOSPITAL_COMMUNITY): Payer: Self-pay | Admitting: *Deleted

## 2016-07-29 NOTE — Telephone Encounter (Signed)
Dee with paramed called and stated patient had a 5lb weight gain in the last week and has lower extremity edema.  I advised her to have patient take an extra 20 mg of torsemide and to call us back if he continues to gain weight.

## 2016-07-29 NOTE — Telephone Encounter (Signed)
Dee called back and said patient feels his weight is stable and refuses to take an extra torsemide.

## 2016-07-29 NOTE — Progress Notes (Signed)
Paramedicine Encounter    Patient ID: Leonard [name], male    DOB: [birth year], 66 y.o.   MRN: [id number]    Patient Care Team: [name], MD as PCP - General (Family Medicine) [name], MD as Consulting Physician (Nephrology)  Patient Active Problem List   Diagnosis Date Noted  . Pleural effusion   . Oxygen desaturation   . Morbid obesity due to excess calories ([place]) 03/17/2016  . Anemia due to stage 4 chronic kidney disease treated with darbepoetin ([place]) 02/28/2016  . Fatigue 01/21/2016  . Diabetic retinopathy ([place]) 11/13/2015  . Dyspnea 08/28/2015  . CHF (congestive heart failure) ([place]) 08/27/2015  . Iron deficiency anemia 08/03/2015  . Claudication ([place])   . Type 2 diabetes mellitus with stage 3 chronic kidney disease, with long-term current use of insulin ([place])   . Secondary hypertension   . Hematuria 06/27/2015  . Skin lesion of face 02/22/2015  . 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 Deficiency anemia 01/15/2015  . CKD (chronic kidney disease) stage 4, GFR 15-29 ml/min (HCC) 01/15/2015  . Hypothyroidism 11/22/2014  . Acute on chronic diastolic congestive heart failure ([place]) 07/04/2014  . COPD GOLD III  02/16/2014  . History of tobacco use 08/23/2013  . 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 CAD (coronary artery disease) 04/06/2013  . PVD - hx of Rt SFA PTA and s/p Rt 4-5th toe amp Dec 2014 04/05/2013  . Essential hypertension 11/25/2012  . Mixed hyperlipidemia 07/17/2008    Current Outpatient Prescriptions:  .  amLODipine (NORVASC) 10 MG tablet, Take 1 tablet (10 mg total) by mouth daily., Disp: 30 tablet, Rfl: 6 .  aspirin 81 MG tablet, TAKE 81 MG BY MOUTH EVERY DAY, Disp: 30 tablet, Rfl: 3 .  atorvastatin (LIPITOR) 40 MG tablet, TAKE 1 TABLET BY MOUTH EVERY DAY AT 6 PM (Patient taking differently: Take 40 mg by mouth daily. ), Disp: 90 tablet, Rfl: 3 .  carvedilol (COREG) 25 MG tablet, Take 2 tablets (50 mg total) by mouth 2  (two) times daily with a meal., Disp: 120 tablet, Rfl: 3 .  cloNIDine (CATAPRES) 0.2 MG tablet, Take 2 tablets (0.4 mg total) by mouth 2 (two) times daily., Disp: 120 tablet, Rfl: 3 .  doxazosin (CARDURA) 8 MG tablet, Take 1 tablet (8 mg total) by mouth 2 (two) times daily., Disp: 60 tablet, Rfl: 6 .  ferrous sulfate (FERROUSUL) 325 (65 FE) MG tablet, Take 1 tablet (325 mg total) by mouth 2 (two) times daily with a meal., Disp: 60 tablet, Rfl: 3 .  hydrALAZINE (APRESOLINE) 100 MG tablet, TAKE ONE TABLET BY MOUTH EVERY EIGHT HOURS (Patient taking differently: Take 100 mg by mouth every 8 (eight) hours. ), Disp: 90 tablet, Rfl: 3 .  insulin aspart (NOVOLOG FLEXPEN) 100 UNIT/ML FlexPen, Inject 12 Units into the skin 3 (three) times daily with meals., Disp: , Rfl:  .  Insulin Glargine (LANTUS SOLOSTAR) 100 UNIT/ML Solostar Pen, Inject 40 Units into the skin at bedtime., Disp: 15 mL, Rfl: 5 .  isosorbide mononitrate (IMDUR) 60 MG 24 hr tablet, Take 1 tablet (60 mg total) by mouth daily., Disp: 30 tablet, Rfl: 3 .  levothyroxine (SYNTHROID, LEVOTHROID) 50 MCG tablet, TAKE 1 TABLET BY MOUTH EVERYDAY BEFORE BREAKFAST, Disp: 30 tablet, Rfl: 2 .  nitroGLYCERIN (NITROSTAT) 0.4 MG SL tablet, Place 1 tablet (0.4 mg total) under the tongue every 5 (five) minutes as needed for chest pain., Disp: 30 tablet, Rfl: 12 .  pantoprazole (PROTONIX) 40 MG tablet, TAKE ONE (1) TABLET BY MOUTH EVERY DAY, Disp: 30 tablet, Rfl: 3 .  potassium chloride SA (K-DUR,KLOR-CON) 20 MEQ tablet, Take 1 tablet (20 mEq total) by mouth 2 (two) times daily., Disp: 135 tablet, Rfl: 3 .  torsemide (DEMADEX) 20 MG tablet, Take 3 tablets (60 mg total) by mouth 2 (two) times daily. May take an extra 20 mg Daily for weight gain above 212 lbs, Disp: 190 tablet, Rfl: 3 Allergies  Allergen Reactions  . No Known Allergies      Social History   Social History  . Marital status: Single    Spouse name: N/A  . Number of children: N/A  . Years of  education: N/A   Occupational History  . Not on file.   Social History Main Topics  . Smoking status: Former Smoker    Packs/day: 1.00    Years: 46.00    Types: Cigarettes    Start date: 02/04/1967    Quit date: 02/03/2013  . Smokeless tobacco: Never Used     Comment: Doing well with quitting.  . Alcohol use No  . Drug use: No  . Sexual activity: Not Currently   Other Topics Concern  . Not on file   Social History Narrative   Lives with sister, [name] in [place].  Retired - former Sports coach.    Physical Exam  Pulmonary/Chest: No respiratory distress. He has no wheezes. He has no rales.  Abdominal: He exhibits no distension. There is no tenderness. There is no guarding.  Musculoskeletal: He exhibits edema.  Skin: Skin is warm and dry. He is not diaphoretic.        Future Appointments Date Time Provider [place]  07/31/2016 2:45 PM [facility] 5 [facility] None  07/31/2016 3:15 PM [facility] FLUSH NURSE 2 [facility] None  08/14/2016 2:45 PM [facility] LAB 5 [facility] None  08/14/2016 3:15 PM [facility] INJ NURSE [facility] None  08/28/2016 2:45 PM [facility] LAB 2 [facility] None  08/28/2016 3:15 PM [facility] INJ NURSE [facility] None  09/02/2016 10:30 AM [facility] PA/NP [facility] None  09/11/2016 2:45 PM [facility] LAB 5 [facility] None  09/11/2016 3:15 PM [facility] INJ NURSE [facility] None  09/25/2016 2:45 PM [facility] LAB 5 [facility] None  09/25/2016 3:15 PM [facility] INJ NURSE [facility] None  10/09/2016 2:45 PM [facility] LAB 5 [facility] None  10/09/2016 3:15 PM [facility] INJ NURSE [facility] None  10/23/2016 2:45 PM [facility] LAB 2 [facility] None  10/23/2016 3:15 PM [facility] INJ NURSE [facility] None  10/29/2016 10:00 AM Gardiner Barefoot, DPM [facility]  11/06/2016 2:45 PM [facility] LAB 5 [facility] None  11/06/2016 3:15 PM [facility] INJ NURSE [facility] None  11/20/2016 2:45 PM [facility] LAB 6 [facility]  None  11/20/2016 3:15 PM Gorsuch, Ni, MD [facility] None  11/20/2016 3:45 PM [facility] INJ NURSE [facility] None  12/19/2016 2:00 PM [facility] VASC 3 [facility]  12/19/2016 2:45 PM Nickel, Sharmon Leyden, NP [facility]    ATF pt CAO x4 c/o being tired. Pt stated that he went to sleep last night around 12am.  He took alof his medications this week.  He stated that he only took 24 units on lantus prior to our arrival but was supposed to take 40units.  He was advised to take the rest of his medications because his cbg was still elevated.  He denies dizziness, headache and chest pain, although he appears sob.  He stated that he just walked up the hall.  rx bottles verified and  pill box refilled.    [facility] heart failure clinic notified of pt's weight increase.  Leonard advised pt to take 1 extra torsemide today. Pt refuses to take another torsemide, he stated "im always around 207 or 209".  I advised Leonard of the same.   BP 140/76 (BP Location: Right Arm, Patient Position: Sitting, Cuff Size: Normal)   Pulse 63   Wt 209 lb (94.8 kg)   SpO2 99%   BMI 31.78 kg/m   cbg 312  **rx called in: Hydralazine isosobribe Pantoprazole  Weight yesterday-209 Last visit weight-204    Shacarra [name], EMT Paramedic 07/29/2016    ACTION: Home visit completed Next visit planned for next tues

## 2016-07-31 ENCOUNTER — Other Ambulatory Visit (HOSPITAL_BASED_OUTPATIENT_CLINIC_OR_DEPARTMENT_OTHER): Payer: Medicare Other

## 2016-07-31 ENCOUNTER — Ambulatory Visit (HOSPITAL_BASED_OUTPATIENT_CLINIC_OR_DEPARTMENT_OTHER): Payer: Medicare Other

## 2016-07-31 VITALS — BP 109/57 | HR 65 | Temp 98.0°F | Resp 18

## 2016-07-31 DIAGNOSIS — N189 Chronic kidney disease, unspecified: Principal | ICD-10-CM

## 2016-07-31 DIAGNOSIS — D631 Anemia in chronic kidney disease: Secondary | ICD-10-CM

## 2016-07-31 DIAGNOSIS — N184 Chronic kidney disease, stage 4 (severe): Secondary | ICD-10-CM

## 2016-07-31 LAB — CBC WITH DIFFERENTIAL/PLATELET
BASO%: 0.8 % (ref 0.0–2.0)
BASOS ABS: 0.1 10*3/uL (ref 0.0–0.1)
EOS ABS: 0.1 10*3/uL (ref 0.0–0.5)
EOS%: 2.2 % (ref 0.0–7.0)
HCT: 28.8 % — ABNORMAL LOW (ref 38.4–49.9)
HGB: 9.2 g/dL — ABNORMAL LOW (ref 13.0–17.1)
LYMPH%: 16.2 % (ref 14.0–49.0)
MCH: 23.5 pg — AB (ref 27.2–33.4)
MCHC: 31.8 g/dL — AB (ref 32.0–36.0)
MCV: 73.8 fL — AB (ref 79.3–98.0)
MONO#: 0.7 10*3/uL (ref 0.1–0.9)
MONO%: 11.3 % (ref 0.0–14.0)
NEUT#: 4.5 10*3/uL (ref 1.5–6.5)
NEUT%: 69.5 % (ref 39.0–75.0)
Platelets: 252 10*3/uL (ref 140–400)
RBC: 3.91 10*6/uL — AB (ref 4.20–5.82)
RDW: 20.6 % — AB (ref 11.0–14.6)
WBC: 6.4 10*3/uL (ref 4.0–10.3)
lymph#: 1 10*3/uL (ref 0.9–3.3)

## 2016-07-31 MED ORDER — DARBEPOETIN ALFA 500 MCG/ML IJ SOSY
500.0000 ug | PREFILLED_SYRINGE | Freq: Once | INTRAMUSCULAR | Status: AC
  Administered 2016-07-31: 500 ug via SUBCUTANEOUS
  Filled 2016-07-31: qty 1

## 2016-07-31 NOTE — Patient Instructions (Signed)

## 2016-08-05 ENCOUNTER — Other Ambulatory Visit (HOSPITAL_COMMUNITY): Payer: Self-pay

## 2016-08-05 NOTE — Progress Notes (Signed)
Paramedicine Encounter    Patient ID: Leonard [name], male    DOB: January 05, 1951, 66 y.o.   MRN: [id number]    Patient Care Team: [name], MD as PCP - General (Family Medicine) [name], MD as Consulting Physician (Nephrology)  Patient Active Problem List   Diagnosis Date Noted  . Pleural effusion   . Oxygen desaturation   . Morbid obesity due to excess calories ([place]) 03/17/2016  . Anemia due to stage 4 chronic kidney disease treated with darbepoetin ([place]) 02/28/2016  . Fatigue 01/21/2016  . Diabetic retinopathy ([place]) 11/13/2015  . Dyspnea 08/28/2015  . CHF (congestive heart failure) ([place]) 08/27/2015  . Iron deficiency anemia 08/03/2015  . Claudication ([place])   . Type 2 diabetes mellitus with stage 3 chronic kidney disease, with long-term current use of insulin ([place])   . Secondary hypertension   . Hematuria 06/27/2015  . Skin lesion of face 02/22/2015  . 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 Deficiency anemia 01/15/2015  . CKD (chronic kidney disease) stage 4, GFR 15-29 ml/min (HCC) 01/15/2015  . Hypothyroidism 11/22/2014  . Acute on chronic diastolic congestive heart failure ([place]) 07/04/2014  . COPD GOLD III  02/16/2014  . History of tobacco use 08/23/2013  . 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 CAD (coronary artery disease) 04/06/2013  . PVD - hx of Rt SFA PTA and s/p Rt 4-5th toe amp Dec 2014 04/05/2013  . Essential hypertension 11/25/2012  . Mixed hyperlipidemia 07/17/2008    Current Outpatient Prescriptions:  .  amLODipine (NORVASC) 10 MG tablet, Take 1 tablet (10 mg total) by mouth daily., Disp: 30 tablet, Rfl: 6 .  aspirin 81 MG tablet, TAKE 81 MG BY MOUTH EVERY DAY, Disp: 30 tablet, Rfl: 3 .  atorvastatin (LIPITOR) 40 MG tablet, TAKE 1 TABLET BY MOUTH EVERY DAY AT 6 PM (Patient taking differently: Take 40 mg by mouth daily. ), Disp: 90 tablet, Rfl: 3 .  carvedilol (COREG) 25 MG tablet, Take 2 tablets (50 mg total) by mouth 2  (two) times daily with a meal., Disp: 120 tablet, Rfl: 3 .  cloNIDine (CATAPRES) 0.2 MG tablet, Take 2 tablets (0.4 mg total) by mouth 2 (two) times daily., Disp: 120 tablet, Rfl: 3 .  doxazosin (CARDURA) 8 MG tablet, Take 1 tablet (8 mg total) by mouth 2 (two) times daily., Disp: 60 tablet, Rfl: 6 .  hydrALAZINE (APRESOLINE) 100 MG tablet, TAKE ONE TABLET BY MOUTH EVERY EIGHT HOURS (Patient taking differently: Take 100 mg by mouth every 8 (eight) hours. ), Disp: 90 tablet, Rfl: 3 .  isosorbide mononitrate (IMDUR) 60 MG 24 hr tablet, Take 1 tablet (60 mg total) by mouth daily., Disp: 30 tablet, Rfl: 3 .  levothyroxine (SYNTHROID, LEVOTHROID) 50 MCG tablet, TAKE 1 TABLET BY MOUTH EVERYDAY BEFORE BREAKFAST, Disp: 30 tablet, Rfl: 2 .  pantoprazole (PROTONIX) 40 MG tablet, TAKE ONE (1) TABLET BY MOUTH EVERY DAY, Disp: 30 tablet, Rfl: 3 .  potassium chloride SA (K-DUR,KLOR-CON) 20 MEQ tablet, Take 1 tablet (20 mEq total) by mouth 2 (two) times daily., Disp: 135 tablet, Rfl: 3 .  torsemide (DEMADEX) 20 MG tablet, Take 3 tablets (60 mg total) by mouth 2 (two) times daily. May take an extra 20 mg Daily for weight gain above 212 lbs, Disp: 190 tablet, Rfl: 3 .  ferrous sulfate (FERROUSUL) 325 (65 FE) MG tablet, Take 1 tablet (325 mg total) by mouth 2 (two) times daily with a meal., Disp: 60 tablet,  Rfl: 3 .  insulin aspart (NOVOLOG FLEXPEN) 100 UNIT/ML FlexPen, Inject 12 Units into the skin 3 (three) times daily with meals., Disp: , Rfl:  .  Insulin Glargine (LANTUS SOLOSTAR) 100 UNIT/ML Solostar Pen, Inject 40 Units into the skin at bedtime., Disp: 15 mL, Rfl: 5 .  nitroGLYCERIN (NITROSTAT) 0.4 MG SL tablet, Place 1 tablet (0.4 mg total) under the tongue every 5 (five) minutes as needed for chest pain., Disp: 30 tablet, Rfl: 12 Allergies  Allergen Reactions  . No Known Allergies      Social History   Social History  . Marital status: Single    Spouse name: N/A  . Number of children: N/A  . Years of  education: N/A   Occupational History  . Not on file.   Social History Main Topics  . Smoking status: Former Smoker    Packs/day: 1.00    Years: 46.00    Types: Cigarettes    Start date: 02/04/1967    Quit date: 02/03/2013  . Smokeless tobacco: Never Used     Comment: Doing well with quitting.  . Alcohol use No  . Drug use: No  . Sexual activity: Not Currently   Other Topics Concern  . Not on file   Social History Narrative   Lives with sister, Leonard [name] in [place].  Retired - former Sports coach.    Physical Exam  Pulmonary/Chest: No respiratory distress. He has no wheezes. He has no rales.  Abdominal: He exhibits no distension. There is no tenderness. There is no guarding.  Musculoskeletal: He exhibits edema.  Skin: Skin is warm and dry. He is not diaphoretic.        Future Appointments Date Time Provider [place]  08/12/2016 10:45 AM [facility] LAB [facility] None  08/14/2016 2:45 PM [facility] 5 [facility] None  08/14/2016 3:15 PM [facility] INJ NURSE [facility] None  08/28/2016 2:45 PM [facility] LAB 2 [facility] None  08/28/2016 3:15 PM [facility] INJ NURSE [facility] None  09/02/2016 10:30 AM [facility] PA/NP [facility] None  09/11/2016 2:45 PM [facility] LAB 5 [facility] None  09/11/2016 3:15 PM [facility] INJ NURSE [facility] None  09/25/2016 2:45 PM [facility] LAB 5 [facility] None  09/25/2016 3:15 PM [facility] INJ NURSE [facility] None  10/09/2016 2:45 PM [facility] LAB 5 [facility] None  10/09/2016 3:15 PM [facility] INJ NURSE [facility] None  10/23/2016 2:45 PM [facility] LAB 2 [facility] None  10/23/2016 3:15 PM [facility] INJ NURSE [facility] None  10/29/2016 10:00 AM Gardiner Barefoot, DPM [facility]  11/06/2016 2:45 PM [facility] LAB 5 [facility] None  11/06/2016 3:15 PM [facility] INJ NURSE [facility] None  11/20/2016 2:45 PM [facility] LAB 6 [facility] None  11/20/2016 3:15 PM Gorsuch, Ni, MD [facility] None  11/20/2016  3:45 PM [facility] INJ NURSE [facility] None  12/19/2016 2:00 PM [facility] VASC 3 [facility]  12/19/2016 2:45 PM Nickel, Sharmon Leyden, NP [facility]    ATF pt CAO x4 c/o dizziness this morning.  Pt stated that he woke up feeling very dizzy; no sob, no nausea and no chest pain.  Pt was concerned that his CBG was low; cbg @ 269.  Pt hasn't taken his morning medications yet. Pt's EKG showed elevated t waves ( a little higher than prior EKG per Junie Panning).  also had orthostatic changes once he stood up.  Pt's B/P decreased from 122/59 sitting to 93/56 standing. I spoke with Erin/Amy and showed Erin his EKG.  Amy advised pt to not take torsemide for today and tomorrow/ and  to stop taking potassium.  I went back to pt's home and revised his pill box accordingly.  rx bottles verified and pill box refilled.   BP (!) 118/56 (BP Location: Right Arm, Patient Position: Sitting, Cuff Size: Normal)   Pulse 63   Resp 16   Wt 207 lb (93.9 kg)   SpO2 96%   BMI 31.47 kg/m   cbg269  Weight yesterday-cant remember Last visit weight-209    Lekeisha Arenas, EMT Paramedic 08/05/2016    ACTION: Home visit completed

## 2016-08-12 ENCOUNTER — Other Ambulatory Visit (HOSPITAL_COMMUNITY): Payer: Self-pay | Admitting: Student

## 2016-08-12 ENCOUNTER — Ambulatory Visit (HOSPITAL_COMMUNITY)
Admission: RE | Admit: 2016-08-12 | Discharge: 2016-08-12 | Disposition: A | Discharge status: Home / Self Care | Payer: Medicare Other | Source: Ambulatory Visit | Attending: Internal Medicine | Admitting: Internal Medicine

## 2016-08-12 ENCOUNTER — Other Ambulatory Visit: Payer: Self-pay | Admitting: Internal Medicine

## 2016-08-12 ENCOUNTER — Other Ambulatory Visit (HOSPITAL_COMMUNITY): Payer: Self-pay

## 2016-08-12 ENCOUNTER — Telehealth (HOSPITAL_COMMUNITY): Payer: Self-pay | Admitting: Pharmacist

## 2016-08-12 DIAGNOSIS — I5032 Chronic diastolic (congestive) heart failure: Secondary | ICD-10-CM | POA: Diagnosis not present

## 2016-08-12 DIAGNOSIS — I10 Essential (primary) hypertension: Secondary | ICD-10-CM

## 2016-08-12 LAB — BASIC METABOLIC PANEL
ANION GAP: 11 (ref 5–15)
BUN: 54 mg/dL — AB (ref 6–20)
CHLORIDE: 101 mmol/L (ref 101–111)
CO2: 23 mmol/L (ref 22–32)
Calcium: 8.7 mg/dL — ABNORMAL LOW (ref 8.9–10.3)
Creatinine, Ser: 2.81 mg/dL — ABNORMAL HIGH (ref 0.61–1.24)
GFR calc non Af Amer: 22 mL/min — ABNORMAL LOW (ref >60)
GFR, EST AFRICAN AMERICAN: 26 mL/min — AB (ref >60)
Glucose, Bld: 287 mg/dL — ABNORMAL HIGH (ref 65–99)
POTASSIUM: 4 mmol/L (ref 3.5–5.1)
SODIUM: 135 mmol/L (ref 135–145)

## 2016-08-12 NOTE — Progress Notes (Signed)
Paramedicine Encounter    Patient ID: Leonard [name], male    DOB: November 09, 1950, 66 y.o.   MRN: [id number]    Patient Care Team: [name], MD as PCP - General (Family Medicine) [name], MD as Consulting Physician (Nephrology)  Patient Active Problem List   Diagnosis Date Noted  . Pleural effusion   . Oxygen desaturation   . Morbid obesity due to excess calories ([place]) 03/17/2016  . Anemia due to stage 4 chronic kidney disease treated with darbepoetin ([place]) 02/28/2016  . Fatigue 01/21/2016  . Diabetic retinopathy ([place]) 11/13/2015  . Dyspnea 08/28/2015  . CHF (congestive heart failure) ([place]) 08/27/2015  . Iron deficiency anemia 08/03/2015  . Claudication ([place])   . Type 2 diabetes mellitus with stage 3 chronic kidney disease, with long-term current use of insulin ([place])   . Secondary hypertension   . Hematuria 06/27/2015  . Skin lesion of face 02/22/2015  . 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 Deficiency anemia 01/15/2015  . CKD (chronic kidney disease) stage 4, GFR 15-29 ml/min (HCC) 01/15/2015  . Hypothyroidism 11/22/2014  . Acute on chronic diastolic congestive heart failure ([place]) 07/04/2014  . COPD GOLD III  02/16/2014  . History of tobacco use 08/23/2013  . 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 CAD (coronary artery disease) 04/06/2013  . PVD - hx of Rt SFA PTA and s/p Rt 4-5th toe amp Dec 2014 04/05/2013  . Essential hypertension 11/25/2012  . Mixed hyperlipidemia 07/17/2008    Current Outpatient Prescriptions:  .  amLODipine (NORVASC) 10 MG tablet, Take 1 tablet (10 mg total) by mouth daily., Disp: 30 tablet, Rfl: 6 .  aspirin 81 MG tablet, TAKE 81 MG BY MOUTH EVERY DAY, Disp: 30 tablet, Rfl: 3 .  atorvastatin (LIPITOR) 40 MG tablet, TAKE 1 TABLET BY MOUTH EVERY DAY AT 6 PM (Patient taking differently: Take 40 mg by mouth daily. ), Disp: 90 tablet, Rfl: 3 .  carvedilol (COREG) 25 MG tablet, Take 2 tablets (50 mg total) by mouth 2  (two) times daily with a meal., Disp: 120 tablet, Rfl: 3 .  cloNIDine (CATAPRES) 0.2 MG tablet, Take 2 tablets (0.4 mg total) by mouth 2 (two) times daily., Disp: 120 tablet, Rfl: 3 .  doxazosin (CARDURA) 8 MG tablet, Take 1 tablet (8 mg total) by mouth 2 (two) times daily., Disp: 60 tablet, Rfl: 6 .  ferrous sulfate (FERROUSUL) 325 (65 FE) MG tablet, Take 1 tablet (325 mg total) by mouth 2 (two) times daily with a meal., Disp: 60 tablet, Rfl: 3 .  hydrALAZINE (APRESOLINE) 100 MG tablet, TAKE ONE TABLET BY MOUTH EVERY EIGHT HOURS (Patient taking differently: Take 100 mg by mouth every 8 (eight) hours. ), Disp: 90 tablet, Rfl: 3 .  isosorbide mononitrate (IMDUR) 60 MG 24 hr tablet, Take 1 tablet (60 mg total) by mouth daily., Disp: 30 tablet, Rfl: 3 .  levothyroxine (SYNTHROID, LEVOTHROID) 50 MCG tablet, TAKE 1 TABLET BY MOUTH EVERYDAY BEFORE BREAKFAST, Disp: 30 tablet, Rfl: 2 .  pantoprazole (PROTONIX) 40 MG tablet, TAKE ONE (1) TABLET BY MOUTH EVERY DAY, Disp: 30 tablet, Rfl: 3 .  torsemide (DEMADEX) 20 MG tablet, Take 3 tablets (60 mg total) by mouth 2 (two) times daily. May take an extra 20 mg Daily for weight gain above 212 lbs, Disp: 190 tablet, Rfl: 3 .  insulin aspart (NOVOLOG FLEXPEN) 100 UNIT/ML FlexPen, Inject 12 Units into the skin 3 (three) times daily with meals., Disp: , Rfl:  .  Insulin Glargine (LANTUS SOLOSTAR) 100 UNIT/ML Solostar Pen, Inject 40 Units into the skin at bedtime., Disp: 15 mL, Rfl: 5 .  nitroGLYCERIN (NITROSTAT) 0.4 MG SL tablet, Place 1 tablet (0.4 mg total) under the tongue every 5 (five) minutes as needed for chest pain., Disp: 30 tablet, Rfl: 12 .  potassium chloride SA (K-DUR,KLOR-CON) 20 MEQ tablet, Take 1 tablet (20 mEq total) by mouth 2 (two) times daily. (Patient not taking: Reported on 08/12/2016), Disp: 135 tablet, Rfl: 3 Allergies  Allergen Reactions  . No Known Allergies      Social History   Social History  . Marital status: Single    Spouse name:  N/A  . Number of children: N/A  . Years of education: N/A   Occupational History  . Not on file.   Social History Main Topics  . Smoking status: Former Smoker    Packs/day: 1.00    Years: 46.00    Types: Cigarettes    Start date: 02/04/1967    Quit date: 02/03/2013  . Smokeless tobacco: Never Used     Comment: Doing well with quitting.  . Alcohol use No  . Drug use: No  . Sexual activity: Not Currently   Other Topics Concern  . Not on file   Social History Narrative   Lives with sister, [name] in [place].  Retired - former Sports coach.    Physical Exam  Pulmonary/Chest: No respiratory distress. He has no wheezes. He has no rales.  Abdominal: He exhibits no distension. There is no tenderness. There is no guarding.  Musculoskeletal: He exhibits edema.  No edema noted to right leg/edema noted to left foot and leg  Skin: Skin is warm and dry. He is not diaphoretic.        Future Appointments Date Time Provider [place]  08/14/2016 2:45 PM [facility] LAB 5 [facility] None  08/14/2016 3:15 PM [facility] FLUSH NURSE [facility] None  08/28/2016 2:45 PM [facility] LAB 2 [facility] None  08/28/2016 3:15 PM [facility] INJ NURSE [facility] None  09/02/2016 10:30 AM [facility] PA/NP [facility] None  09/11/2016 2:45 PM [facility] LAB 5 [facility] None  09/11/2016 3:15 PM [facility] INJ NURSE [facility] None  09/25/2016 2:45 PM [facility] LAB 5 [facility] None  09/25/2016 3:15 PM [facility] INJ NURSE [facility] None  10/09/2016 2:45 PM [facility] LAB 5 [facility] None  10/09/2016 3:15 PM [facility] INJ NURSE [facility] None  10/23/2016 2:45 PM [facility] LAB 2 [facility] None  10/23/2016 3:15 PM [facility] INJ NURSE [facility] None  10/29/2016 10:00 AM Gardiner Barefoot, DPM [facility]  11/06/2016 2:45 PM [facility] LAB 5 [facility] None  11/06/2016 3:15 PM [facility] INJ NURSE [facility] None  11/20/2016 2:45 PM [facility] LAB 6 [facility] None   11/20/2016 3:15 PM Gorsuch, Ni, MD [facility] None  11/20/2016 3:45 PM [facility] INJ NURSE [facility] None  12/19/2016 2:00 PM [facility] VASC 3 [facility]  12/19/2016 2:45 PM Nickel, Sharmon Leyden, NP [facility]    ATF pt at the [facility] clinic getting lab work done.  I started refilling his pill box and he arrived shortly afterwards. Pt stated that he hasn't had any dizziness since last week. He stated that he is a little tired today but he has been walking more than normal. Pt missed medications on Friday.  He denies sob,dizziness, headache and chest pain.  rx bottles verified and pill box refilled. Potassium was left out of his pill box until otherwise advised by [facility].   BP (!) 148/62 (BP Location: Right Arm, Patient  Position: Sitting, Cuff Size: Normal)   Pulse 68   Resp 16   Wt 210 lb (95.3 kg)   SpO2 96%   BMI 31.93 kg/m   cbg 301 Weight yesterday-cant remember (i gave him a calendar)  Last visit weight-207    Yamilet Mcfayden, EMT Paramedic 08/12/2016    ACTION: Home visit completed Next visit planned for next tuesday

## 2016-08-12 NOTE — Telephone Encounter (Signed)
Dee Journalist, [employer]) called to verify if Leonard [name] should restart his potassium that was held last week along with his torsemide. Based on a discussion with Amy, NP-C, will restart his torsemide today along with KCl at 20 mEq daily based on his BMET results today. Dee aware and will update his pillbox accordingly.   Ruta Hinds. Velva Harman, PharmD, BCPS, CPP Clinical Pharmacist Pager: [phone] Phone: [phone] 08/12/2016 2:36 PM

## 2016-08-14 ENCOUNTER — Other Ambulatory Visit (HOSPITAL_BASED_OUTPATIENT_CLINIC_OR_DEPARTMENT_OTHER): Payer: Medicare Other

## 2016-08-14 ENCOUNTER — Ambulatory Visit (HOSPITAL_BASED_OUTPATIENT_CLINIC_OR_DEPARTMENT_OTHER): Payer: Medicare Other

## 2016-08-14 VITALS — BP 163/62 | HR 66 | Temp 98.1°F | Resp 18

## 2016-08-14 DIAGNOSIS — D631 Anemia in chronic kidney disease: Secondary | ICD-10-CM | POA: Diagnosis not present

## 2016-08-14 DIAGNOSIS — N184 Chronic kidney disease, stage 4 (severe): Secondary | ICD-10-CM | POA: Diagnosis present

## 2016-08-14 DIAGNOSIS — N189 Chronic kidney disease, unspecified: Principal | ICD-10-CM

## 2016-08-14 LAB — CBC WITH DIFFERENTIAL/PLATELET
BASO%: 0.6 % (ref 0.0–2.0)
BASOS ABS: 0 10*3/uL (ref 0.0–0.1)
EOS%: 3.1 % (ref 0.0–7.0)
Eosinophils Absolute: 0.2 10*3/uL (ref 0.0–0.5)
HCT: 26.4 % — ABNORMAL LOW (ref 38.4–49.9)
HGB: 8.3 g/dL — ABNORMAL LOW (ref 13.0–17.1)
LYMPH%: 15.8 % (ref 14.0–49.0)
MCH: 22.6 pg — AB (ref 27.2–33.4)
MCHC: 31.3 g/dL — AB (ref 32.0–36.0)
MCV: 72.3 fL — ABNORMAL LOW (ref 79.3–98.0)
MONO#: 0.5 10*3/uL (ref 0.1–0.9)
MONO%: 8.3 % (ref 0.0–14.0)
NEUT#: 4.2 10*3/uL (ref 1.5–6.5)
NEUT%: 72.2 % (ref 39.0–75.0)
Platelets: 210 10*3/uL (ref 140–400)
RBC: 3.65 10*6/uL — AB (ref 4.20–5.82)
RDW: 20.6 % — AB (ref 11.0–14.6)
WBC: 5.7 10*3/uL (ref 4.0–10.3)
lymph#: 0.9 10*3/uL (ref 0.9–3.3)

## 2016-08-14 MED ORDER — DARBEPOETIN ALFA 500 MCG/ML IJ SOSY
500.0000 ug | PREFILLED_SYRINGE | Freq: Once | INTRAMUSCULAR | Status: AC
  Administered 2016-08-14: 500 ug via SUBCUTANEOUS
  Filled 2016-08-14: qty 1

## 2016-08-19 ENCOUNTER — Other Ambulatory Visit (HOSPITAL_COMMUNITY): Payer: Self-pay

## 2016-08-19 NOTE — Progress Notes (Signed)
Paramedicine Encounter    Patient ID: Leonard [name], male    DOB: 1950-03-15, 66 y.o.   MRN: [id number]    Patient Care Team: [name], MD as PCP - General (Family Medicine) [name], MD as Consulting Physician (Nephrology)  Patient Active Problem List   Diagnosis Date Noted  . Pleural effusion   . Oxygen desaturation   . Morbid obesity due to excess calories ([place]) 03/17/2016  . Anemia due to stage 4 chronic kidney disease treated with darbepoetin ([place]) 02/28/2016  . Fatigue 01/21/2016  . Diabetic retinopathy ([place]) 11/13/2015  . Dyspnea 08/28/2015  . CHF (congestive heart failure) ([place]) 08/27/2015  . Iron deficiency anemia 08/03/2015  . Claudication ([place])   . Type 2 diabetes mellitus with stage 3 chronic kidney disease, with long-term current use of insulin ([place])   . Secondary hypertension   . Hematuria 06/27/2015  . Skin lesion of face 02/22/2015  . 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 Deficiency anemia 01/15/2015  . CKD (chronic kidney disease) stage 4, GFR 15-29 ml/min (HCC) 01/15/2015  . Hypothyroidism 11/22/2014  . Acute on chronic diastolic congestive heart failure ([place]) 07/04/2014  . COPD GOLD III  02/16/2014  . History of tobacco use 08/23/2013  . 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 CAD (coronary artery disease) 04/06/2013  . PVD - hx of Rt SFA PTA and s/p Rt 4-5th toe amp Dec 2014 04/05/2013  . Essential hypertension 11/25/2012  . Mixed hyperlipidemia 07/17/2008    Current Outpatient Prescriptions:  .  amLODipine (NORVASC) 10 MG tablet, Take 1 tablet (10 mg total) by mouth daily., Disp: 30 tablet, Rfl: 6 .  aspirin 81 MG tablet, TAKE 81 MG BY MOUTH EVERY DAY, Disp: 30 tablet, Rfl: 3 .  atorvastatin (LIPITOR) 40 MG tablet, TAKE 1 TABLET BY MOUTH EVERY DAY AT 6 PM (Patient taking differently: Take 40 mg by mouth daily. ), Disp: 90 tablet, Rfl: 3 .  carvedilol (COREG) 25 MG tablet, Take 2 tablets (50 mg total) by mouth 2  (two) times daily with a meal., Disp: 120 tablet, Rfl: 3 .  carvedilol (COREG) 25 MG tablet, TAKE TWO (2) TABLETS BY MOUTH 2 TIMES DAILY WITH A MEAL, Disp: 120 tablet, Rfl: 3 .  cloNIDine (CATAPRES) 0.2 MG tablet, Take 2 tablets (0.4 mg total) by mouth 2 (two) times daily., Disp: 120 tablet, Rfl: 3 .  doxazosin (CARDURA) 8 MG tablet, Take 1 tablet (8 mg total) by mouth 2 (two) times daily., Disp: 60 tablet, Rfl: 6 .  ferrous sulfate (FERROUSUL) 325 (65 FE) MG tablet, Take 1 tablet (325 mg total) by mouth 2 (two) times daily with a meal., Disp: 60 tablet, Rfl: 3 .  hydrALAZINE (APRESOLINE) 100 MG tablet, TAKE ONE TABLET BY MOUTH EVERY EIGHT HOURS (Patient taking differently: Take 100 mg by mouth every 8 (eight) hours. ), Disp: 90 tablet, Rfl: 3 .  insulin aspart (NOVOLOG FLEXPEN) 100 UNIT/ML FlexPen, Inject 12 Units into the skin 3 (three) times daily with meals., Disp: , Rfl:  .  Insulin Glargine (LANTUS SOLOSTAR) 100 UNIT/ML Solostar Pen, Inject 40 Units into the skin at bedtime., Disp: 15 mL, Rfl: 5 .  isosorbide mononitrate (IMDUR) 60 MG 24 hr tablet, Take 1 tablet (60 mg total) by mouth daily., Disp: 30 tablet, Rfl: 3 .  levothyroxine (SYNTHROID, LEVOTHROID) 50 MCG tablet, TAKE ONE (1) TABLET BY MOUTH EVERY DAY BEFORE BREAKFAST, Disp: 30 tablet, Rfl: 3 .  pantoprazole (PROTONIX) 40 MG tablet,  TAKE ONE (1) TABLET BY MOUTH EVERY DAY, Disp: 30 tablet, Rfl: 3 .  potassium chloride SA (K-DUR,KLOR-CON) 20 MEQ tablet, Take 20 mEq by mouth daily., Disp: , Rfl:  .  torsemide (DEMADEX) 20 MG tablet, Take 3 tablets (60 mg total) by mouth 2 (two) times daily. May take an extra 20 mg Daily for weight gain above 212 lbs, Disp: 190 tablet, Rfl: 3 .  nitroGLYCERIN (NITROSTAT) 0.4 MG SL tablet, Place 1 tablet (0.4 mg total) under the tongue every 5 (five) minutes as needed for chest pain., Disp: 30 tablet, Rfl: 12 Allergies  Allergen Reactions  . No Known Allergies      Social History   Social History  .  Marital status: Single    Spouse name: N/A  . Number of children: N/A  . Years of education: N/A   Occupational History  . Not on file.   Social History Main Topics  . Smoking status: Former Smoker    Packs/day: 1.00    Years: 46.00    Types: Cigarettes    Start date: 02/04/1967    Quit date: 02/03/2013  . Smokeless tobacco: Never Used     Comment: Doing well with quitting.  . Alcohol use No  . Drug use: No  . Sexual activity: Not Currently   Other Topics Concern  . Not on file   Social History Narrative   Lives with sister, [name] in [place].  Retired - former Sports coach.    Physical Exam  Pulmonary/Chest: No respiratory distress. He has no wheezes. He has no rales.  Abdominal: He exhibits no distension. There is no tenderness. There is no guarding.  Musculoskeletal: He exhibits edema.  Both ankles and lower legs/left side is worse than rt per pt's norm  Skin: Skin is warm and dry. He is not diaphoretic.        Future Appointments Date Time Provider [place]  08/28/2016 2:45 PM [facility] LAB 2 [facility] None  08/28/2016 3:15 PM [facility] INJ NURSE [facility] None  09/02/2016 10:30 AM [facility] PA/NP [facility] None  09/11/2016 2:45 PM [facility] LAB 5 [facility] None  09/11/2016 3:15 PM [facility] INJ NURSE [facility] None  09/25/2016 2:45 PM [facility] LAB 5 [facility] None  09/25/2016 3:15 PM [facility] INJ NURSE [facility] None  10/09/2016 2:45 PM [facility] LAB 5 [facility] None  10/09/2016 3:15 PM [facility] INJ NURSE [facility] None  10/23/2016 2:45 PM [facility] LAB 2 [facility] None  10/23/2016 3:15 PM [facility] INJ NURSE [facility] None  10/29/2016 10:00 AM Gardiner Barefoot, DPM [facility]  11/06/2016 2:45 PM [facility] LAB 5 [facility] None  11/06/2016 3:15 PM [facility] INJ NURSE [facility] None  11/20/2016 2:45 PM [facility] LAB 6 [facility] None  11/20/2016 3:15 PM Gorsuch, Ni, MD [facility] None  11/20/2016 3:45  PM [facility] INJ NURSE [facility] None  12/19/2016 2:00 PM [facility] VASC 3 [facility]  12/19/2016 2:45 PM Nickel, Sharmon Leyden, NP [facility]    ATF pt CAO x4 sitting outside on the porch and talking to his sister.  Pt stated that he feels "a little queasy" this am since he took his medications but he contribute it to not eating prior to taking his morning meds.  He denies dizziness, sob, and chest pain. He stated that his stomach feels upset.  Pt missed his evening dose of medications on Monday. He said that his pills were mixed up due to the pill box being knocked over.  Pt stated that he just put the pills back in  the box as best as he could.  Today's noon slot had meds mixed together some were doubled.  rx bottles verified and the pill box refilled and corrected.  Pt's cbg is noted; he stated that he hasn't taken insulin yet and he plans on doing so after I leave.  Pt refused to allow me to call his pcp, he stated that his cbg should improve after he takes the insulin. He will call his pcp or myself if the reading doesn't decrease.   BP (!) 154/66 (BP Location: Right Arm, Patient Position: Sitting, Cuff Size: Normal)   Pulse 67   Resp 12   Wt 210 lb (95.3 kg)   SpO2 96%   BMI 31.93 kg/m   PTA cbg 280 Current 357  Weight yesterday-210 Last visit weight-207    Tracey Hermance, EMT Paramedic 08/19/2016    ACTION: Home visit completed Next visit planned for next tuesday

## 2016-08-25 ENCOUNTER — Telehealth: Payer: Self-pay | Admitting: Orthotics

## 2016-08-26 ENCOUNTER — Other Ambulatory Visit (HOSPITAL_COMMUNITY): Payer: Self-pay | Admitting: Student

## 2016-08-26 ENCOUNTER — Telehealth (HOSPITAL_COMMUNITY): Payer: Self-pay | Admitting: *Deleted

## 2016-08-26 ENCOUNTER — Other Ambulatory Visit (HOSPITAL_COMMUNITY): Payer: Self-pay

## 2016-08-26 NOTE — Progress Notes (Signed)
Paramedicine Encounter    Patient ID: Leonard [name], male    DOB: [birth year], 66 y.o.   MRN: [id number]    Patient Care Team: [name], MD as PCP - General (Family Medicine) [name], MD as Consulting Physician (Nephrology)  Patient Active Problem List   Diagnosis Date Noted  . Pleural effusion   . Oxygen desaturation   . Morbid obesity due to excess calories ([place]) 03/17/2016  . Anemia due to stage 4 chronic kidney disease treated with darbepoetin ([place]) 02/28/2016  . Fatigue 01/21/2016  . Diabetic retinopathy ([place]) 11/13/2015  . Dyspnea 08/28/2015  . CHF (congestive heart failure) ([place]) 08/27/2015  . Iron deficiency anemia 08/03/2015  . Claudication ([place])   . Type 2 diabetes mellitus with stage 3 chronic kidney disease, with long-term current use of insulin ([place])   . Secondary hypertension   . Hematuria 06/27/2015  . Skin lesion of face 02/22/2015  . 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 Deficiency anemia 01/15/2015  . CKD (chronic kidney disease) stage 4, GFR 15-29 ml/min (HCC) 01/15/2015  . Hypothyroidism 11/22/2014  . Acute on chronic diastolic congestive heart failure ([place]) 07/04/2014  . COPD GOLD III  02/16/2014  . History of tobacco use 08/23/2013  . 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 CAD (coronary artery disease) 04/06/2013  . PVD - hx of Rt SFA PTA and s/p Rt 4-5th toe amp Dec 2014 04/05/2013  . Essential hypertension 11/25/2012  . Mixed hyperlipidemia 07/17/2008    Current Outpatient Prescriptions:  .  amLODipine (NORVASC) 10 MG tablet, Take 1 tablet (10 mg total) by mouth daily., Disp: 30 tablet, Rfl: 6 .  aspirin 81 MG tablet, TAKE 81 MG BY MOUTH EVERY DAY, Disp: 30 tablet, Rfl: 3 .  atorvastatin (LIPITOR) 40 MG tablet, TAKE 1 TABLET BY MOUTH EVERY DAY AT 6 PM (Patient taking differently: Take 40 mg by mouth daily. ), Disp: 90 tablet, Rfl: 3 .  carvedilol (COREG) 25 MG tablet, Take 2 tablets (50 mg total) by mouth 2  (two) times daily with a meal., Disp: 120 tablet, Rfl: 3 .  carvedilol (COREG) 25 MG tablet, TAKE TWO (2) TABLETS BY MOUTH 2 TIMES DAILY WITH A MEAL, Disp: 120 tablet, Rfl: 3 .  cloNIDine (CATAPRES) 0.2 MG tablet, Take 2 tablets (0.4 mg total) by mouth 2 (two) times daily., Disp: 120 tablet, Rfl: 3 .  doxazosin (CARDURA) 8 MG tablet, Take 1 tablet (8 mg total) by mouth 2 (two) times daily., Disp: 60 tablet, Rfl: 6 .  ferrous sulfate (FERROUSUL) 325 (65 FE) MG tablet, Take 1 tablet (325 mg total) by mouth 2 (two) times daily with a meal., Disp: 60 tablet, Rfl: 3 .  hydrALAZINE (APRESOLINE) 100 MG tablet, TAKE ONE TABLET BY MOUTH EVERY EIGHT HOURS (Patient taking differently: Take 100 mg by mouth every 8 (eight) hours. ), Disp: 90 tablet, Rfl: 3 .  insulin aspart (NOVOLOG FLEXPEN) 100 UNIT/ML FlexPen, Inject 12 Units into the skin 3 (three) times daily with meals., Disp: , Rfl:  .  Insulin Glargine (LANTUS SOLOSTAR) 100 UNIT/ML Solostar Pen, Inject 40 Units into the skin at bedtime., Disp: 15 mL, Rfl: 5 .  levothyroxine (SYNTHROID, LEVOTHROID) 50 MCG tablet, TAKE ONE (1) TABLET BY MOUTH EVERY DAY BEFORE BREAKFAST, Disp: 30 tablet, Rfl: 3 .  pantoprazole (PROTONIX) 40 MG tablet, TAKE ONE (1) TABLET BY MOUTH EVERY DAY, Disp: 30 tablet, Rfl: 3 .  potassium chloride SA (K-DUR,KLOR-CON) 20 MEQ tablet, Take 20  mEq by mouth daily., Disp: , Rfl:  .  torsemide (DEMADEX) 20 MG tablet, Take 3 tablets (60 mg total) by mouth 2 (two) times daily. May take an extra 20 mg Daily for weight gain above 212 lbs, Disp: 190 tablet, Rfl: 3 .  isosorbide mononitrate (IMDUR) 60 MG 24 hr tablet, TAKE 1 TABLET BY MOUTH DAILY., Disp: 30 tablet, Rfl: 3 .  nitroGLYCERIN (NITROSTAT) 0.4 MG SL tablet, Place 1 tablet (0.4 mg total) under the tongue every 5 (five) minutes as needed for chest pain., Disp: 30 tablet, Rfl: 12 Allergies  Allergen Reactions  . No Known Allergies      Social History   Social History  . Marital status:  Single    Spouse name: N/A  . Number of children: N/A  . Years of education: N/A   Occupational History  . Not on file.   Social History Main Topics  . Smoking status: Former Smoker    Packs/day: 1.00    Years: 46.00    Types: Cigarettes    Start date: 02/04/1967    Quit date: 02/03/2013  . Smokeless tobacco: Never Used     Comment: Doing well with quitting.  . Alcohol use No  . Drug use: No  . Sexual activity: Not Currently   Other Topics Concern  . Not on file   Social History Narrative   Lives with sister, [name] in [place].  Retired - former Sports coach.    Physical Exam  Pulmonary/Chest: No respiratory distress. He has no wheezes. He has no rales.  Abdominal: He exhibits no distension. There is no tenderness. There is no guarding.  Musculoskeletal: He exhibits edema.  Left foot and ankle  Skin: Skin is warm and dry. He is not diaphoretic.        Future Appointments Date Time Provider [place]  08/28/2016 2:45 PM [facility] LAB 2 [facility] None  08/28/2016 3:15 PM [facility] FLUSH NURSE [facility] None  09/02/2016 10:30 AM [facility] PA/NP [facility] None  09/11/2016 2:45 PM [facility] LAB 5 [facility] None  09/11/2016 3:15 PM [facility] INJ NURSE [facility] None  09/25/2016 2:45 PM [facility] LAB 5 [facility] None  09/25/2016 3:15 PM [facility] INJ NURSE [facility] None  10/09/2016 2:45 PM [facility] LAB 5 [facility] None  10/09/2016 3:15 PM [facility] INJ NURSE [facility] None  10/23/2016 2:45 PM [facility] LAB 2 [facility] None  10/23/2016 3:15 PM [facility] INJ NURSE [facility] None  10/29/2016 10:00 AM Gardiner Barefoot, DPM [facility]  11/06/2016 2:45 PM [facility] LAB 5 [facility] None  11/06/2016 3:15 PM [facility] INJ NURSE [facility] None  11/20/2016 2:45 PM [facility] LAB 6 [facility] None  11/20/2016 3:15 PM Gorsuch, Ni, MD [facility] None  11/20/2016 3:45 PM [facility] INJ NURSE [facility] None  12/19/2016 2:00 PM  [facility] VASC 3 [facility]  12/19/2016 2:45 PM Nickel, Sharmon Leyden, NP [facility]    ATF pt CAO x4 sitting in his recliner sleeping.  Pt has no complaints of sob, chest pain, dizziness or headache. He stated that his weight was 216 yesterday and 213 today.  He hasn't taken his morning medications yet including insulin.  His friend Webb Silversmith) called and stated that she believes that pt had a fever over the weekend but pt stated that he was just cold due to the air conditioner.  Pt has missed several doses of medications this week, he fell asleep per pt.  rx bottles verified and pill box refilled.    I called [facility] clinic and reported pt's weight,  jasmine spoke with the physician and advised for him to take one extra torsemide today.  pt stated that he need a rx for a boot from his primary and test strips for his glucometer.  7.5 inr 2.58 35mon, fri BP 132/78 (BP Location: Right Arm, Patient Position: Sitting, Cuff Size: Normal)   Pulse 66   Resp 16   Wt 213 lb (96.6 kg)   SpO2 98%   BMI 32.39 kg/m  pta cbg 218 Current 341 (pt stated that he will take insulin in a "few minutes" **rx called in: isosorbide pantoprzole Clonidine Asa Ferrous sulfate  Weight yesterday-216 Last visit weight-210    Jalaine Riggenbach, EMT Paramedic 08/26/2016    ACTION: Home visit completed

## 2016-08-26 NOTE — Telephone Encounter (Signed)
Dee with paramedicine called patients weight is 213lbs today from 216lbs yesterday patient is supposed to take an additional torsemide tablet for weight above 212lbs. Patient still has swelling in legs.  Advised Dee to have patient take the extra Torsemide today.

## 2016-08-28 ENCOUNTER — Other Ambulatory Visit: Payer: Self-pay | Admitting: Hematology and Oncology

## 2016-08-28 ENCOUNTER — Ambulatory Visit (HOSPITAL_COMMUNITY)
Admission: RE | Admit: 2016-08-28 | Discharge: 2016-08-28 | Disposition: A | Discharge status: Home / Self Care | Payer: Medicare Other | Source: Ambulatory Visit | Attending: Hematology and Oncology | Admitting: Hematology and Oncology

## 2016-08-28 ENCOUNTER — Other Ambulatory Visit: Payer: Self-pay | Admitting: Hematology

## 2016-08-28 ENCOUNTER — Ambulatory Visit: Payer: Medicare Other

## 2016-08-28 ENCOUNTER — Ambulatory Visit (HOSPITAL_BASED_OUTPATIENT_CLINIC_OR_DEPARTMENT_OTHER): Payer: Medicare Other

## 2016-08-28 ENCOUNTER — Other Ambulatory Visit (HOSPITAL_BASED_OUTPATIENT_CLINIC_OR_DEPARTMENT_OTHER): Payer: Medicare Other

## 2016-08-28 VITALS — BP 119/53 | HR 65 | Temp 98.2°F | Resp 16

## 2016-08-28 DIAGNOSIS — D631 Anemia in chronic kidney disease: Secondary | ICD-10-CM | POA: Insufficient documentation

## 2016-08-28 DIAGNOSIS — N184 Chronic kidney disease, stage 4 (severe): Secondary | ICD-10-CM | POA: Diagnosis not present

## 2016-08-28 DIAGNOSIS — N189 Chronic kidney disease, unspecified: Principal | ICD-10-CM

## 2016-08-28 LAB — CBC WITH DIFFERENTIAL/PLATELET
BASO%: 0.3 % (ref 0.0–2.0)
Basophils Absolute: 0 10*3/uL (ref 0.0–0.1)
EOS%: 3.1 % (ref 0.0–7.0)
Eosinophils Absolute: 0.2 10*3/uL (ref 0.0–0.5)
HEMATOCRIT: 22.9 % — AB (ref 38.4–49.9)
HEMOGLOBIN: 6.9 g/dL — AB (ref 13.0–17.1)
LYMPH#: 1.2 10*3/uL (ref 0.9–3.3)
LYMPH%: 20.1 % (ref 14.0–49.0)
MCH: 21.7 pg — AB (ref 27.2–33.4)
MCHC: 30.1 g/dL — ABNORMAL LOW (ref 32.0–36.0)
MCV: 72 fL — ABNORMAL LOW (ref 79.3–98.0)
MONO#: 0.5 10*3/uL (ref 0.1–0.9)
MONO%: 8.5 % (ref 0.0–14.0)
NEUT%: 68 % (ref 39.0–75.0)
NEUTROS ABS: 4.2 10*3/uL (ref 1.5–6.5)
Platelets: 222 10*3/uL (ref 140–400)
RBC: 3.18 10*6/uL — ABNORMAL LOW (ref 4.20–5.82)
RDW: 19.2 % — ABNORMAL HIGH (ref 11.0–14.6)
WBC: 6.1 10*3/uL (ref 4.0–10.3)
nRBC: 0 % (ref 0–0)

## 2016-08-28 LAB — TECHNOLOGIST REVIEW

## 2016-08-28 LAB — PREPARE RBC (CROSSMATCH)

## 2016-08-28 MED ORDER — DARBEPOETIN ALFA 500 MCG/ML IJ SOSY
500.0000 ug | PREFILLED_SYRINGE | Freq: Once | INTRAMUSCULAR | Status: AC
  Administered 2016-08-28: 500 ug via SUBCUTANEOUS
  Filled 2016-08-28: qty 1

## 2016-08-29 ENCOUNTER — Ambulatory Visit (HOSPITAL_COMMUNITY)
Admission: RE | Admit: 2016-08-29 | Discharge: 2016-08-29 | Disposition: A | Discharge status: Home / Self Care | Payer: Medicare Other | Source: Ambulatory Visit | Attending: Hematology and Oncology | Admitting: Hematology and Oncology

## 2016-08-29 ENCOUNTER — Telehealth: Payer: Self-pay

## 2016-08-29 DIAGNOSIS — D631 Anemia in chronic kidney disease: Secondary | ICD-10-CM

## 2016-08-29 DIAGNOSIS — N184 Chronic kidney disease, stage 4 (severe): Principal | ICD-10-CM

## 2016-08-29 MED ORDER — SODIUM CHLORIDE 0.9 % IV SOLN
250.0000 mL | Freq: Once | INTRAVENOUS | Status: AC
  Administered 2016-08-29: 250 mL via INTRAVENOUS

## 2016-08-29 MED ORDER — DIPHENHYDRAMINE HCL 25 MG PO CAPS
25.0000 mg | ORAL_CAPSULE | Freq: Once | ORAL | Status: AC
  Administered 2016-08-29: 25 mg via ORAL
  Filled 2016-08-29: qty 1

## 2016-08-29 MED ORDER — ACETAMINOPHEN 325 MG PO TABS
650.0000 mg | ORAL_TABLET | Freq: Once | ORAL | Status: AC
  Administered 2016-08-29: 650 mg via ORAL
  Filled 2016-08-29: qty 2

## 2016-08-29 NOTE — Discharge Instructions (Signed)

## 2016-08-29 NOTE — Telephone Encounter (Signed)
Gwen RN at [facility] called. Pt is there for 2 units of blood. His pulse ox read in the 80s. She applied O2 2 l/[place] and O2 is 92%. Pt has pulse ox at home and it read 89%. Pt currently does not have home O2. Instructed Gwen to give the blood and monitor the O2 and report again as to pt's condition.

## 2016-08-29 NOTE — Progress Notes (Signed)
Diagnosis Association: 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) (N18.4 , D63.1)  Provider: [name]  Procedure: Pt received 2 units of PRBCs.  Pt tolerated procedure.  Post procedure: Pt alert,oriented, ambulatory at discharge. D/C instructions given to patient with verbal understanding.

## 2016-08-29 NOTE — Telephone Encounter (Signed)
S/w Gwen and pt was 97% on room air when he completed his blood.

## 2016-08-29 NOTE — Progress Notes (Signed)
Called placed to Dr. Calton Dach nurse regarding Leonard [name]'s oxygen saturation. His O2 sat was in the 80's and up to 90 pre blood vitals. During the course of the first 15 minute check after patient was placed on 2 liters of oxygen. Pt's O2 sat now up to 92. Pt alert and oriented. He states that he is a little short of breathe.  Will cont to monitor patient.

## 2016-08-29 NOTE — Progress Notes (Signed)
Leonard [name]'s O2 sat was 97 on room air at discharge. He denies shortness of breath. He was ambulatory.

## 2016-08-30 ENCOUNTER — Encounter (HOSPITAL_COMMUNITY): Payer: Self-pay

## 2016-08-30 ENCOUNTER — Emergency Department (HOSPITAL_COMMUNITY)
Admission: EM | Admit: 2016-08-30 | Discharge: 2016-08-30 | Disposition: A | Discharge status: Home / Self Care | Payer: Medicare Other | Attending: Emergency Medicine | Admitting: Emergency Medicine

## 2016-08-30 ENCOUNTER — Emergency Department (HOSPITAL_BASED_OUTPATIENT_CLINIC_OR_DEPARTMENT_OTHER): Payer: Medicare Other

## 2016-08-30 DIAGNOSIS — Z85828 Personal history of other malignant neoplasm of skin: Secondary | ICD-10-CM | POA: Insufficient documentation

## 2016-08-30 DIAGNOSIS — Z87891 Personal history of nicotine dependence: Secondary | ICD-10-CM | POA: Insufficient documentation

## 2016-08-30 DIAGNOSIS — Z7982 Long term (current) use of aspirin: Secondary | ICD-10-CM | POA: Diagnosis not present

## 2016-08-30 DIAGNOSIS — N289 Disorder of kidney and ureter, unspecified: Secondary | ICD-10-CM | POA: Diagnosis not present

## 2016-08-30 DIAGNOSIS — I825Y2 Chronic embolism and thrombosis of unspecified deep veins of left proximal lower extremity: Secondary | ICD-10-CM

## 2016-08-30 DIAGNOSIS — Z794 Long term (current) use of insulin: Secondary | ICD-10-CM | POA: Diagnosis not present

## 2016-08-30 DIAGNOSIS — I2510 Atherosclerotic heart disease of native coronary artery without angina pectoris: Secondary | ICD-10-CM | POA: Diagnosis not present

## 2016-08-30 DIAGNOSIS — I129 Hypertensive chronic kidney disease with stage 1 through stage 4 chronic kidney disease, or unspecified chronic kidney disease: Secondary | ICD-10-CM | POA: Insufficient documentation

## 2016-08-30 DIAGNOSIS — Z951 Presence of aortocoronary bypass graft: Secondary | ICD-10-CM | POA: Insufficient documentation

## 2016-08-30 DIAGNOSIS — R600 Localized edema: Secondary | ICD-10-CM | POA: Insufficient documentation

## 2016-08-30 DIAGNOSIS — R739 Hyperglycemia, unspecified: Secondary | ICD-10-CM | POA: Diagnosis not present

## 2016-08-30 DIAGNOSIS — R2242 Localized swelling, mass and lump, left lower limb: Secondary | ICD-10-CM | POA: Diagnosis present

## 2016-08-30 DIAGNOSIS — E1122 Type 2 diabetes mellitus with diabetic chronic kidney disease: Secondary | ICD-10-CM | POA: Diagnosis not present

## 2016-08-30 DIAGNOSIS — J449 Chronic obstructive pulmonary disease, unspecified: Secondary | ICD-10-CM | POA: Insufficient documentation

## 2016-08-30 DIAGNOSIS — I5032 Chronic diastolic (congestive) heart failure: Secondary | ICD-10-CM | POA: Insufficient documentation

## 2016-08-30 DIAGNOSIS — D649 Anemia, unspecified: Secondary | ICD-10-CM | POA: Insufficient documentation

## 2016-08-30 DIAGNOSIS — I130 Hypertensive heart and chronic kidney disease with heart failure and stage 1 through stage 4 chronic kidney disease, or unspecified chronic kidney disease: Secondary | ICD-10-CM | POA: Diagnosis not present

## 2016-08-30 DIAGNOSIS — N184 Chronic kidney disease, stage 4 (severe): Secondary | ICD-10-CM | POA: Insufficient documentation

## 2016-08-30 DIAGNOSIS — I82502 Chronic embolism and thrombosis of unspecified deep veins of left lower extremity: Secondary | ICD-10-CM | POA: Insufficient documentation

## 2016-08-30 DIAGNOSIS — M7989 Other specified soft tissue disorders: Secondary | ICD-10-CM | POA: Diagnosis not present

## 2016-08-30 DIAGNOSIS — R7889 Finding of other specified substances, not normally found in blood: Secondary | ICD-10-CM | POA: Diagnosis not present

## 2016-08-30 LAB — CBC WITH DIFFERENTIAL/PLATELET
BASOS PCT: 0 %
Basophils Absolute: 0 10*3/uL (ref 0.0–0.1)
EOS ABS: 0.3 10*3/uL (ref 0.0–0.7)
EOS PCT: 5 %
HCT: 27.7 % — ABNORMAL LOW (ref 39.0–52.0)
HEMOGLOBIN: 8.7 g/dL — AB (ref 13.0–17.0)
LYMPHS ABS: 0.9 10*3/uL (ref 0.7–4.0)
Lymphocytes Relative: 15 %
MCH: 23 pg — AB (ref 26.0–34.0)
MCHC: 31.4 g/dL (ref 30.0–36.0)
MCV: 73.3 fL — ABNORMAL LOW (ref 78.0–100.0)
MONO ABS: 0.3 10*3/uL (ref 0.1–1.0)
MONOS PCT: 6 %
NEUTROS PCT: 74 %
Neutro Abs: 4.4 10*3/uL (ref 1.7–7.7)
PLATELETS: 194 10*3/uL (ref 150–400)
RBC: 3.78 MIL/uL — ABNORMAL LOW (ref 4.22–5.81)
RDW: 19.1 % — AB (ref 11.5–15.5)
WBC: 5.9 10*3/uL (ref 4.0–10.5)

## 2016-08-30 LAB — COMPREHENSIVE METABOLIC PANEL
ALT: 7 U/L — AB (ref 17–63)
AST: 9 U/L — AB (ref 15–41)
Albumin: 3 g/dL — ABNORMAL LOW (ref 3.5–5.0)
Alkaline Phosphatase: 83 U/L (ref 38–126)
Anion gap: 10 (ref 5–15)
BUN: 39 mg/dL — AB (ref 6–20)
CALCIUM: 8.4 mg/dL — AB (ref 8.9–10.3)
CHLORIDE: 102 mmol/L (ref 101–111)
CO2: 23 mmol/L (ref 22–32)
CREATININE: 2.6 mg/dL — AB (ref 0.61–1.24)
GFR calc non Af Amer: 24 mL/min — ABNORMAL LOW (ref >60)
GFR, EST AFRICAN AMERICAN: 28 mL/min — AB (ref >60)
GLUCOSE: 322 mg/dL — AB (ref 65–99)
Potassium: 3.9 mmol/L (ref 3.5–5.1)
SODIUM: 135 mmol/L (ref 135–145)
Total Bilirubin: 0.8 mg/dL (ref 0.3–1.2)
Total Protein: 6.9 g/dL (ref 6.5–8.1)

## 2016-08-30 LAB — TYPE AND SCREEN
ABO/RH(D): B NEG
Antibody Screen: NEGATIVE
UNIT DIVISION: 0
Unit division: 0

## 2016-08-30 LAB — BPAM RBC
BLOOD PRODUCT EXPIRATION DATE: 201808182359
Blood Product Expiration Date: 201808132359
ISSUE DATE / TIME: 201807270838
ISSUE DATE / TIME: 201807270838
UNIT TYPE AND RH: 1700
UNIT TYPE AND RH: 1700

## 2016-08-30 LAB — I-STAT CG4 LACTIC ACID, ED: LACTIC ACID, VENOUS: 1.1 mmol/L (ref 0.5–1.9)

## 2016-08-30 NOTE — ED Triage Notes (Signed)
Patient complains of left foot pain and swelling for months. Also reports that blood sugar has been running high, positive pulses, alert and oriented, NAD

## 2016-08-30 NOTE — Discharge Instructions (Signed)
Take medications as prescribed. Keep feet elevated when sitting. Follow-up with your doctor on Monday as scheduled

## 2016-08-30 NOTE — Progress Notes (Signed)
VASCULAR LAB PRELIMINARY  PRELIMINARY  PRELIMINARY  PRELIMINARY  Left lower extremity venous duplex completed.    Preliminary report:  There is no acute DVT noted in the left lower extremity.  There is chronic DVT noted in the mid femoral vein.   Gave report to Dr. Cecilie Lowers, [facility], RVT 08/30/2016, 2:08 PM

## 2016-08-30 NOTE — ED Provider Notes (Signed)
[facility] DEPT Provider Note   CSN: [id number] Arrival date & time: 08/30/16  1053     History   Chief Complaint Chief Complaint  Patient presents with  . Hyperglycemia/foot swelling    HPI Leonard [name] is a 66 y.o. male.  HPI This is a 66 year old man history of congestive heart failure, chronic kidney disease, coronary artery disease who presents today complaining of swelling in the left foot. He is unable tell me how long it is been there. He states that his neighbor noted today and states that he come in for evaluation. He denies any injury. He denies any chest pain or shortness of breath. He denies history of prior DVT or PE. He does not think he has had a similar episode in the past . Past Medical History:  Diagnosis Date  . Cancer ([place])    skin cancer-  . CHF (congestive heart failure) ([place])   . Chronic kidney disease    not on dialysis  . Coronary artery disease   . Gangrene of toe ([place])    right 5th/notes 01/04/2013   . High cholesterol   . Hypertension   . Iron deficiency anemia 08/03/2015  . 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 PAD (peripheral artery disease) ([place])   . Shortness of breath dyspnea    with exertion  . Type II diabetes mellitus ([place])    Type II    Patient Active Problem List   Diagnosis Date Noted  . Pleural effusion   . Oxygen desaturation   . Morbid obesity due to excess calories ([place]) 03/17/2016  . Anemia due to stage 4 chronic kidney disease treated with darbepoetin ([place]) 02/28/2016  . Fatigue 01/21/2016  . Diabetic retinopathy ([place]) 11/13/2015  . Dyspnea 08/28/2015  . CHF (congestive heart failure) ([place]) 08/27/2015  . Iron deficiency anemia 08/03/2015  . Claudication ([place])   . Type 2 diabetes mellitus with stage 3 chronic kidney disease, with long-term current use of insulin ([place])   . Secondary hypertension   . Hematuria 06/27/2015  . Skin lesion of face 02/22/2015  . MGUS (monoclonal  gammopathy of unknown significance) 01/15/2015  . Deficiency anemia 01/15/2015  . CKD (chronic kidney disease) stage 4, GFR 15-29 ml/min (HCC) 01/15/2015  . Hypothyroidism 11/22/2014  . Acute on chronic diastolic congestive heart failure ([place]) 07/04/2014  . COPD GOLD III  02/16/2014  . History of tobacco use 08/23/2013  . 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 CAD (coronary artery disease) 04/06/2013  . PVD - hx of Rt SFA PTA and s/p Rt 4-5th toe amp Dec 2014 04/05/2013  . Essential hypertension 11/25/2012  . Mixed hyperlipidemia 07/17/2008    Past Surgical History:  Procedure Laterality Date  . AMPUTATION Right 01/28/2013   Procedure: Sony Schlarb AMPUTATION RIGHT  4th & 5th TOE;  Surgeon: Rosetta Posner, MD;  Location: [facility] OR;  Service: Vascular;  Laterality: Right;  . ANGIOPLASTY / STENTING FEMORAL Right 01/13/2013   superficial femoral artery x 2 (3 mm x 60 mm, 4 mm x 60 mm)  . AV FISTULA PLACEMENT Left 02/11/2016   Procedure: LEFT UPPER ARM BRACHIAL CEPHALIC ARTERIOVENOUS (AV) FISTULA CREATION;  Surgeon: Conrad Dry Ridge, MD;  Location: [facility];  Service: Vascular;  Laterality: Left;  . COLONOSCOPY W/ POLYPECTOMY    . CORONARY ARTERY BYPASS GRAFT N/A 04/07/2013   Procedure: CORONARY ARTERY BYPASS GRAFTING (CABG);  Surgeon: Ivin Poot, MD;  Location: [facility];  Service: Open Heart Surgery;  Laterality:  N/A;  . INTRAOPERATIVE TRANSESOPHAGEAL ECHOCARDIOGRAM N/A 04/07/2013   Procedure: INTRAOPERATIVE TRANSESOPHAGEAL ECHOCARDIOGRAM;  Surgeon: Ivin Poot, MD;  Location: [facility];  Service: Open Heart Surgery;  Laterality: N/A;  . LEFT HEART CATHETERIZATION WITH CORONARY ANGIOGRAM N/A 04/05/2013   Procedure: LEFT HEART CATHETERIZATION WITH CORONARY ANGIOGRAM;  Surgeon: Peter M Martinique, MD;  Location: [facility] CATH LAB;  Service: Cardiovascular;  Laterality: N/A;  . LOWER EXTREMITY ANGIOGRAM Bilateral 01/06/2013   Procedure: LOWER EXTREMITY ANGIOGRAM;  Surgeon: Conrad Axtell, MD;  Location: [facility] CATH LAB;  Service: Cardiovascular;   Laterality: Bilateral;  . LOWER EXTREMITY ANGIOGRAM Right 01/13/2013   Procedure: LOWER EXTREMITY ANGIOGRAM;  Surgeon: Conrad Mountain, MD;  Location: [facility] CATH LAB;  Service: Cardiovascular;  Laterality: Right;  . SKIN CANCER EXCISION  2017  . THROAT SURGERY  1983   polyps  . TONSILLECTOMY AND ADENOIDECTOMY  ~ 1965       Home Medications    Prior to Admission medications   Medication Sig Start Date End Date Taking? Authorizing Provider  amLODipine (NORVASC) 10 MG tablet Take 1 tablet (10 mg total) by mouth daily. 02/19/16   Clegg, Amy D, NP  aspirin 81 MG tablet TAKE 81 MG BY MOUTH EVERY DAY 02/11/16   Virgina Jock A, PA-C  atorvastatin (LIPITOR) 40 MG tablet TAKE 1 TABLET BY MOUTH EVERY DAY AT 6 PM Patient taking differently: Take 40 mg by mouth daily.  11/07/15   Shirley Friar, PA-C  carvedilol (COREG) 25 MG tablet Take 2 tablets (50 mg total) by mouth 2 (two) times daily with a meal. 10/23/15   Tillery, Satira Mccallum, PA-C  carvedilol (COREG) 25 MG tablet TAKE TWO (2) TABLETS BY MOUTH 2 TIMES DAILY WITH A MEAL 08/13/16   Tillery, Satira Mccallum, PA-C  cloNIDine (CATAPRES) 0.2 MG tablet Take 2 tablets (0.4 mg total) by mouth 2 (two) times daily. 06/10/16   Clegg, Amy D, NP  doxazosin (CARDURA) 8 MG tablet Take 1 tablet (8 mg total) by mouth 2 (two) times daily. 01/07/16   Larey Dresser, MD  ferrous sulfate (FERROUSUL) 325 (65 FE) MG tablet Take 1 tablet (325 mg total) by mouth 2 (two) times daily with a meal. 05/20/16   Eloise Levels, MD  hydrALAZINE (APRESOLINE) 100 MG tablet TAKE ONE TABLET BY MOUTH EVERY EIGHT HOURS Patient taking differently: Take 100 mg by mouth every 8 (eight) hours.  10/23/15   Shirley Friar, PA-C  insulin aspart (NOVOLOG FLEXPEN) 100 UNIT/ML FlexPen Inject 12 Units into the skin 3 (three) times daily with meals. 03/10/16   Zenia Resides, MD  Insulin Glargine (LANTUS SOLOSTAR) 100 UNIT/ML Solostar Pen Inject 40 Units into the skin at bedtime.  05/20/16   Eloise Levels, MD  isosorbide mononitrate (IMDUR) 60 MG 24 hr tablet TAKE 1 TABLET BY MOUTH DAILY. 08/26/16   Shirley Friar, PA-C  levothyroxine (SYNTHROID, LEVOTHROID) 50 MCG tablet TAKE ONE (1) TABLET BY MOUTH EVERY DAY BEFORE BREAKFAST 08/12/16   [name], MD  nitroGLYCERIN (NITROSTAT) 0.4 MG SL tablet Place 1 tablet (0.4 mg total) under the tongue every 5 (five) minutes as needed for chest pain. 06/06/15   Mercy Riding, MD  pantoprazole (PROTONIX) 40 MG tablet TAKE ONE (1) TABLET BY MOUTH EVERY DAY 05/27/16   [name], MD  potassium chloride SA (K-DUR,KLOR-CON) 20 MEQ tablet Take 20 mEq by mouth daily.    [provider]  torsemide (DEMADEX) 20 MG tablet Take 3  tablets (60 mg total) by mouth 2 (two) times daily. May take an extra 20 mg Daily for weight gain above 212 lbs 05/20/16   Eloise Levels, MD    Family History Family History  Problem Relation Age of Onset  . Cancer Mother        lung cancer  . Hypertension Mother   . Cancer Sister        patient thinks it was uterine cancer  . Hypertension Sister   . Heart attack Sister   . Stroke Neg Hx     Social History Social History  Substance Use Topics  . Smoking status: Former Smoker    Packs/day: 1.00    Years: 46.00    Types: Cigarettes    Start date: 02/04/1967    Quit date: 02/03/2013  . Smokeless tobacco: Never Used     Comment: Doing well with quitting.  . Alcohol use No     Allergies   No known allergies   Review of Systems Review of Systems  All other systems reviewed and are negative.    Physical Exam Updated Vital Signs BP (!) 174/73   Pulse 70   Temp 98.2 F (36.8 C) (Oral)   Resp (!) 21   SpO2 96%   Physical Exam  Constitutional: He is oriented to person, place, and time. He appears well-developed and well-nourished.  HENT:  Head: Normocephalic and atraumatic.  Right Ear: External ear normal.  Left Ear: External ear normal.  Eyes: Pupils are  equal, round, and reactive to light. EOM are normal.  Neck: Normal range of motion. Neck supple.  Cardiovascular: Normal rate and regular rhythm.   Pulmonary/Chest: Effort normal and breath sounds normal.  Abdominal: Soft. Bowel sounds are normal.  Musculoskeletal:  Bilateral lower extremity swelling with left foot significantly more swollen than right pulses intact  Neurological: He is alert and oriented to person, place, and time.  Skin: Skin is warm. Capillary refill takes less than 2 seconds.  Psychiatric: He has a normal mood and affect.  Nursing note and vitals reviewed.    ED Treatments / Results  Labs (all labs ordered are listed, but only abnormal results are displayed) Labs Reviewed  COMPREHENSIVE METABOLIC PANEL - Abnormal; Notable for the following:       Result Value   Glucose, Bld 322 (*)    BUN 39 (*)    Creatinine, Ser 2.60 (*)    Calcium 8.4 (*)    Albumin 3.0 (*)    AST 9 (*)    ALT 7 (*)    GFR calc non Af Amer 24 (*)    GFR calc Af Amer 28 (*)    All other components within normal limits  CBC WITH DIFFERENTIAL/PLATELET - Abnormal; Notable for the following:    RBC 3.78 (*)    Hemoglobin 8.7 (*)    HCT 27.7 (*)    MCV 73.3 (*)    MCH 23.0 (*)    RDW 19.1 (*)    All other components within normal limits  I-STAT CG4 LACTIC ACID, ED  I-STAT CG4 LACTIC ACID, ED    EKG  EKG Interpretation None       Radiology No results found.  Procedures Procedures (including critical care time)  Medications Ordered in ED Medications - No data to display   Initial Impression / Assessment and Plan / ED Course  I have reviewed the triage vital signs and the nursing notes.  Pertinent labs & imaging results that  were available during my care of the patient were reviewed by me and considered in my medical decision making (see chart for details).    1-left pedal edema vascular ultrasound done here without evidence of acute clot but do not chronic cough with  calcifications. Patient with recent transfusion for anemia, doubt patient is candidate for anticoagulation, but plan for close follow-up with primary care. 2 anemia patient's hemoglobin has increased from 6.9-8.7 patient reports having transfusion yesterday 3 renal insufficiency crease to 2.6 today from 2.8 on July 10. 4 diabetic with hyperglycemia no indication of DKA. Patient advised regarding treatment and need for follow-up. Discussed with Dr. Varney Daily on call for family practice. He has seen and evaluated patient. They've arranged for follow-up on Monday. Patient voices understanding of plan. Final Clinical Impressions(s) / ED Diagnoses   Final diagnoses:  Pedal edema  Chronic deep vein thrombosis (DVT) of proximal vein of left lower extremity (HCC)  Renal insufficiency  Anemia, unspecified type    New Prescriptions New Prescriptions   No medications on file     Pattricia Boss, MD 08/30/16 1536

## 2016-09-01 ENCOUNTER — Ambulatory Visit (INDEPENDENT_AMBULATORY_CARE_PROVIDER_SITE_OTHER): Payer: Medicare Other | Admitting: Family Medicine

## 2016-09-01 ENCOUNTER — Encounter: Payer: Self-pay | Admitting: Family Medicine

## 2016-09-01 DIAGNOSIS — I739 Peripheral vascular disease, unspecified: Secondary | ICD-10-CM | POA: Diagnosis not present

## 2016-09-01 DIAGNOSIS — I779 Disorder of arteries and arterioles, unspecified: Secondary | ICD-10-CM

## 2016-09-01 NOTE — Patient Instructions (Addendum)
You were seen in clinic today for left lower leg swelling and pain.  Since you had an ultrasound performed in the ED on Saturday which was negative, I am not concerned for a clot at this time.   I would recommend taking an increased dose of your Lasix today at 80 mg twice daily and following up with your appointment with Dr. Aundra Dubin tomorrow as scheduled.  I would also recommend compression stockings as this could help with the fluid accumulating in your legs.

## 2016-09-01 NOTE — Progress Notes (Signed)
Subjective:   Patient ID: Leonard [name]    DOB: 1950-03-28, 66 y.o. male   MRN: [id number]  CC: Left foot pain   HPI: Leonard [name] is a 66 y.o. male who presents to clinic today for follow up from ED.    L foot pain He is present today with his sister.  Was recently seen in the ED on Saturday 7/28 for same left leg pain and slight discoloration compared to the RLE. Is not worse since Saturday but he presents for continued pain and swelling.  Doppler US performed 7/28 to look for DVT and he was instructed to continue current medications and keep feet elevated when sitting with PCP follow up in clinic.  He notes he has been having this pain for a few months but unsure how long exactly.  Describes the pain as throbbing.  Sometimes feels numbness and tingling.  Pain is worse with walking.  He is concerned because he has a history of toe amputations on right foot and the symptoms in that foot started similarly. H/o PVD with Rt SFA.  Denies CP, SOB, palpitations.  He is not currently on home O2 however used to be on oxygen a few years ago and was taken off it because he was doing well.  Denies fevers, chills, abdominal pain, nausea or vomiting.  Ambulates without a cane or walker.  Of note, at home takes Torsemide 60 mg BID.  On Saturday he took 80 mg BID instead to try to get more of the fluid off and felt somewhat better afterwards.  Has not tried wearing compression stockings in the past.  He states he has a cardiology appointment with Dr. Aundra Dubin tomorrow.    ROS: See HPI for pertinent ROS.   [place]: Pertinent past medical, surgical, family, and social history were reviewed and updated as appropriate. Smoking status reviewed. Medications reviewed. Objective:   BP (!) 130/50   Pulse 71   Temp 98 F (36.7 C) (Oral)   Wt 216 lb (98 kg)   BMI 32.84 kg/m  Vitals and nursing note reviewed.  General: 66 yo male, no acute distress  HEENT: NCAT, MMM Neck: supple CV: RRR no MRG, palpable  pedal pulses  Lungs: CTAB, WOB is comfortable  Abdomen: soft, NTND, +BS  Skin: warm, dry  Extremities: Left foot with slight dusky appearance over dorsal surface and 1+ pitting edema, 1+ pulse and sensation intact, R foot with amputated 4th and 5th digits, slight dusky appearance over dorsal surface  Neuro: alert, no focal deficits, 5/5 strength in bilateral LE, sensation intact   Assessment & Plan:   PVD - hx of Rt SFA PTA and s/p Rt 4-5th toe amp Dec 2014 LLE pulses intact although somewhat hard to find.  Overlying skin on dorsum of foot with slight discoloration.  Sensation intact with 1+ pitting edema compared to right foot.  Recent US doppler LLE on 7/28 revealed chronic deep vein thrombosis involving the mid femoral vein of the left lower extremity.  Pt on Aspirin 81 mg daily.  Follows with Dr. Aundra Dubin and has appointment scheduled for tomorrow 7/31.  Symptoms likely 2/2 fluid accumulation in LE.   -Recommend increasing Torsemide to 80 mg BID today and follow up with Cardiology tomorrow -Advised pt to use compression stockings at home  -Elevate legs when sitting or supine  -Will reassess fluid status at next appointment  -Follow up with PCP in 1 mo   Lovenia Kim, MD [place], PGY-1  09/02/2016 9:37 AM

## 2016-09-02 ENCOUNTER — Other Ambulatory Visit (HOSPITAL_COMMUNITY): Payer: Self-pay

## 2016-09-02 ENCOUNTER — Ambulatory Visit (HOSPITAL_COMMUNITY)
Admission: RE | Admit: 2016-09-02 | Discharge: 2016-09-02 | Disposition: A | Discharge status: Home / Self Care | Payer: Medicare Other | Source: Ambulatory Visit | Attending: Cardiology | Admitting: Cardiology

## 2016-09-02 ENCOUNTER — Encounter (HOSPITAL_COMMUNITY): Payer: Medicaid Other

## 2016-09-02 VITALS — BP 144/68 | HR 78 | Wt 214.6 lb

## 2016-09-02 DIAGNOSIS — N185 Chronic kidney disease, stage 5: Secondary | ICD-10-CM | POA: Insufficient documentation

## 2016-09-02 DIAGNOSIS — Z951 Presence of aortocoronary bypass graft: Secondary | ICD-10-CM | POA: Diagnosis not present

## 2016-09-02 DIAGNOSIS — Z8249 Family history of ischemic heart disease and other diseases of the circulatory system: Secondary | ICD-10-CM | POA: Insufficient documentation

## 2016-09-02 DIAGNOSIS — I82512 Chronic embolism and thrombosis of left femoral vein: Secondary | ICD-10-CM

## 2016-09-02 DIAGNOSIS — Z794 Long term (current) use of insulin: Secondary | ICD-10-CM | POA: Insufficient documentation

## 2016-09-02 DIAGNOSIS — D472 Monoclonal gammopathy: Secondary | ICD-10-CM | POA: Insufficient documentation

## 2016-09-02 DIAGNOSIS — Z801 Family history of malignant neoplasm of trachea, bronchus and lung: Secondary | ICD-10-CM | POA: Diagnosis not present

## 2016-09-02 DIAGNOSIS — Z9981 Dependence on supplemental oxygen: Secondary | ICD-10-CM | POA: Diagnosis not present

## 2016-09-02 DIAGNOSIS — E785 Hyperlipidemia, unspecified: Secondary | ICD-10-CM | POA: Insufficient documentation

## 2016-09-02 DIAGNOSIS — Z87891 Personal history of nicotine dependence: Secondary | ICD-10-CM | POA: Insufficient documentation

## 2016-09-02 DIAGNOSIS — I10 Essential (primary) hypertension: Secondary | ICD-10-CM | POA: Diagnosis not present

## 2016-09-02 DIAGNOSIS — G4733 Obstructive sleep apnea (adult) (pediatric): Secondary | ICD-10-CM | POA: Diagnosis not present

## 2016-09-02 DIAGNOSIS — N184 Chronic kidney disease, stage 4 (severe): Secondary | ICD-10-CM | POA: Diagnosis not present

## 2016-09-02 DIAGNOSIS — I5032 Chronic diastolic (congestive) heart failure: Secondary | ICD-10-CM | POA: Diagnosis not present

## 2016-09-02 DIAGNOSIS — I5022 Chronic systolic (congestive) heart failure: Secondary | ICD-10-CM

## 2016-09-02 DIAGNOSIS — Z7982 Long term (current) use of aspirin: Secondary | ICD-10-CM | POA: Diagnosis not present

## 2016-09-02 DIAGNOSIS — I132 Hypertensive heart and chronic kidney disease with heart failure and with stage 5 chronic kidney disease, or end stage renal disease: Secondary | ICD-10-CM | POA: Diagnosis not present

## 2016-09-02 DIAGNOSIS — I2510 Atherosclerotic heart disease of native coronary artery without angina pectoris: Secondary | ICD-10-CM | POA: Insufficient documentation

## 2016-09-02 DIAGNOSIS — E1122 Type 2 diabetes mellitus with diabetic chronic kidney disease: Secondary | ICD-10-CM | POA: Insufficient documentation

## 2016-09-02 DIAGNOSIS — I739 Peripheral vascular disease, unspecified: Secondary | ICD-10-CM | POA: Insufficient documentation

## 2016-09-02 DIAGNOSIS — D631 Anemia in chronic kidney disease: Secondary | ICD-10-CM | POA: Insufficient documentation

## 2016-09-02 DIAGNOSIS — Z809 Family history of malignant neoplasm, unspecified: Secondary | ICD-10-CM | POA: Insufficient documentation

## 2016-09-02 DIAGNOSIS — E039 Hypothyroidism, unspecified: Secondary | ICD-10-CM | POA: Diagnosis not present

## 2016-09-02 DIAGNOSIS — J449 Chronic obstructive pulmonary disease, unspecified: Secondary | ICD-10-CM | POA: Diagnosis not present

## 2016-09-02 NOTE — Progress Notes (Signed)
PCP: Dr. Juanito Doom Cardiology: Dr. Aundra Dubin Nephrology: Dr Marval Regal.   66 yo with history of CAD s/p CABG, PAD, chronic diastolic CHF, CKD stage IV, COPD on home oxygen, and HTN presents for cardiology followup.  He has had multiple admissions this year for diastolic CHF.  Last echo in 5/17 showed EF 60-65% with grade II diastolic dysfunction.  He also has COPD with PFTs showing severe obstruction.    He returns today for follow up. He was seen in the ED on 08/30/16 for left foot swelling, found to have chronic DVT in mid femoral vein. Denies SOB with walking into clinic, no SOB with ADL's. Taking his medications, however he has not taken his insulin this am. He has been taking torsemide 70 mg BID for the past 3 days, this is an increase from his usual 60 mg BID. His weight has been stable at 210 pounds since increasing his torsemide. Eating some high salt foods, recently had more hamburgers.   Labs (8/17): K 3.9, creatinine 2.33, HCT 28.5, LDL 46 Labs (11/17): hgb 9, K 4.4, creatinine 3, BNP 64 Labs 03/24/2016: K 3.5 Creatinine 2.48.  Labs 05/09/2016: K 4.3 Creatinine 3.13 Hgb 9.0   PMH: 1. Chronic diastolic CHF: Multiple recent admissions.  Echo (5/17) with EF 60-65%, grade II diastolic dysfunction, normal RV size and systolic function.  2. PAD: Right femoral PCI in 12/14. Peripheral arterial dopplers in 6/17 with occluded right SFA.  He follows with [facility].  3. HTN: Renal artery dopplers negative in 5/17.  4. Hypothyroidism 5. CKD stage IV: Follows with Dr. Marval Regal. 6. CAD: s/p CABG x 3 in 3/15.  7. OSA: Mild, not on CPAP.  8. COPD: PFTs (3/15) with FVC 77%, FEV1 47%, ratio 59%, TLC 55% => severe obstruction.  Prior smoker. He is on 2 L home O2.  9. 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 Hyperlipidemia 12. Anemia of renal disease   Social History   Social History  . Marital status: Single    Spouse name: N/A  . Number of children: N/A  . Years of education: N/A   Occupational History  .  Not on file.   Social History Main Topics  . Smoking status: Former Smoker    Packs/day: 1.00    Years: 46.00    Types: Cigarettes    Start date: 02/04/1967    Quit date: 02/03/2013  . Smokeless tobacco: Never Used     Comment: Doing well with quitting.  . Alcohol use No  . Drug use: No  . Sexual activity: Not Currently   Other Topics Concern  . Not on file   Social History Narrative   Lives with sister, [name] in [place].  Retired - former Sports coach.   Family History  Problem Relation Age of Onset  . Cancer Mother        lung cancer  . Hypertension Mother   . Cancer Sister        patient thinks it was uterine cancer  . Hypertension Sister   . Heart attack Sister   . Stroke Neg Hx    ROS: All systems reviewed and negative except as per HPI.   Current Outpatient Prescriptions  Medication Sig Dispense Refill  . amLODipine (NORVASC) 10 MG tablet Take 1 tablet (10 mg total) by mouth daily. 30 tablet 6  . aspirin 81 MG tablet TAKE 81 MG BY MOUTH EVERY DAY 30 tablet 3  . atorvastatin (LIPITOR) 40 MG tablet TAKE 1 TABLET BY MOUTH EVERY  DAY AT 6 PM (Patient taking differently: Take 40 mg by mouth daily. ) 90 tablet 3  . carvedilol (COREG) 25 MG tablet Take 2 tablets (50 mg total) by mouth 2 (two) times daily with a meal. 120 tablet 3  . carvedilol (COREG) 25 MG tablet TAKE TWO (2) TABLETS BY MOUTH 2 TIMES DAILY WITH A MEAL (Patient not taking: Reported on 08/30/2016) 120 tablet 3  . cloNIDine (CATAPRES) 0.2 MG tablet Take 2 tablets (0.4 mg total) by mouth 2 (two) times daily. 120 tablet 3  . doxazosin (CARDURA) 8 MG tablet Take 1 tablet (8 mg total) by mouth 2 (two) times daily. 60 tablet 6  . ferrous sulfate (FERROUSUL) 325 (65 FE) MG tablet Take 1 tablet (325 mg total) by mouth 2 (two) times daily with a meal. 60 tablet 3  . hydrALAZINE (APRESOLINE) 100 MG tablet TAKE ONE TABLET BY MOUTH EVERY EIGHT HOURS (Patient taking differently: Take 100 mg by mouth every 8 (eight)  hours. ) 90 tablet 3  . insulin aspart (NOVOLOG FLEXPEN) 100 UNIT/ML FlexPen Inject 12 Units into the skin 3 (three) times daily with meals. (Patient taking differently: Inject 12 Units into the skin 2 (two) times daily. )    . Insulin Glargine (LANTUS SOLOSTAR) 100 UNIT/ML Solostar Pen Inject 40 Units into the skin at bedtime. (Patient taking differently: Inject 40 Units into the skin daily. 10 am) 15 mL 5  . isosorbide mononitrate (IMDUR) 60 MG 24 hr tablet TAKE 1 TABLET BY MOUTH DAILY. 30 tablet 3  . levothyroxine (SYNTHROID, LEVOTHROID) 50 MCG tablet TAKE ONE (1) TABLET BY MOUTH EVERY DAY BEFORE BREAKFAST 30 tablet 3  . nitroGLYCERIN (NITROSTAT) 0.4 MG SL tablet Place 1 tablet (0.4 mg total) under the tongue every 5 (five) minutes as needed for chest pain. 30 tablet 12  . pantoprazole (PROTONIX) 40 MG tablet TAKE ONE (1) TABLET BY MOUTH EVERY DAY 30 tablet 3  . potassium chloride SA (K-DUR,KLOR-CON) 20 MEQ tablet Take 20 mEq by mouth daily.    Marland Kitchen torsemide (DEMADEX) 20 MG tablet Take 3 tablets (60 mg total) by mouth 2 (two) times daily. May take an extra 20 mg Daily for weight gain above 212 lbs 190 tablet 3   No current facility-administered medications for this encounter.      BP (!) 144/68 (BP Location: Right Arm, Patient Position: Sitting, Cuff Size: Normal)   Pulse 78   Wt 214 lb 9.6 oz (97.3 kg)   SpO2 96%   BMI 32.63 kg/m    Filed Weights   09/02/16 1042  Weight: 214 lb 9.6 oz (97.3 kg)   General:  Male, NAD.  HEENT: normal Neck: supple. JVP to jaw. Carotids 2+ bilat; no bruits. No lymphadenopathy or thryomegaly appreciated. Cor: PMI non displaced. Regular rate and rhythm. No rubs, gallops or murmurs. Lungs: Clear bilaterally. Normal effort.  Abdomen: Soft, non tender, non distended. No hepatosplenomegaly. No bruits or masses. Good bowel sounds. Extremities: no cyanosis, clubbing, rash. 3+ left pedal edema.  Neuro: alert & orientedx3, cranial nerves grossly intact. moves all  4 extremities w/o difficulty. Affect pleasant   Assessment/Plan: 1. Chronic diastolic CHF: Echo 0/3704 EF 60-65%.  - NYHA II - Reds Vest 40%, Will increase torsemide to 80 mg BID for 3 days. BMET in 3 days (he had a BMET 2 days ago in the ED). Continue KCl 97mEq daily. - Continue Coreg 50 mg BID.  - Encouraged compliance with low sodium diet and fluid restrictions.  2. HTN:  - Well controlled on current regimen.   3. CAD s.p CABG - No signs or symptoms of ischemia - Continue ASA, statin and Coreg.      4. COPD: Gold stage III   - Follows with Dr. Melvyn Novas.    5. CKD: Stage V.-  L AVF. Sees Dr Marval Regal.  - BMET in 3 days after increased torsemide.   6. DVT - Diagnosed 08/30/16 - has chronic DVT involving mid femoral vein. Discussed with Dr. Aundra Dubin who recommended anticoagulation with warfarin, however the patient has recently received a blood transfusion (3 days ago) for anemia felt to be related to CKD. He is not actively bleeding, so anticoagulation is likely fine but will send a message to his hematologist regarding this prior to starting warfarin. We will call him and let him know about starting warfarin pending [name].   Follow up in 2 weeks. BMET in 3 days.   Arbutus Leas NP-C  09/02/2016   Greater than 50% of the (total minutes 30) visit spent in counseling/coordination of care regarding discussing medication changes with PharmD and physician, patient education, medication compliance.

## 2016-09-02 NOTE — Progress Notes (Signed)
Paramedicine Encounter    Patient ID: Leonard [name], male    DOB: [birth year], 66 y.o.   MRN: [id number]    Patient Care Team: [name], MD as PCP - General (Family Medicine) [name], MD as Consulting Physician (Nephrology)  Patient Active Problem List   Diagnosis Date Noted  . Pleural effusion   . Oxygen desaturation   . Morbid obesity due to excess calories ([place]) 03/17/2016  . Anemia due to stage 4 chronic kidney disease treated with darbepoetin ([place]) 02/28/2016  . Fatigue 01/21/2016  . Diabetic retinopathy ([place]) 11/13/2015  . Dyspnea 08/28/2015  . CHF (congestive heart failure) ([place]) 08/27/2015  . Iron deficiency anemia 08/03/2015  . Claudication ([place])   . Type 2 diabetes mellitus with stage 3 chronic kidney disease, with long-term current use of insulin ([place])   . Secondary hypertension   . Hematuria 06/27/2015  . Skin lesion of face 02/22/2015  . 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 Deficiency anemia 01/15/2015  . CKD (chronic kidney disease) stage 4, GFR 15-29 ml/min (HCC) 01/15/2015  . Hypothyroidism 11/22/2014  . Acute on chronic diastolic congestive heart failure ([place]) 07/04/2014  . COPD GOLD III  02/16/2014  . History of tobacco use 08/23/2013  . 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 CAD (coronary artery disease) 04/06/2013  . PVD - hx of Rt SFA PTA and s/p Rt 4-5th toe amp Dec 2014 04/05/2013  . Essential hypertension 11/25/2012  . Mixed hyperlipidemia 07/17/2008    Current Outpatient Prescriptions:  .  amLODipine (NORVASC) 10 MG tablet, Take 1 tablet (10 mg total) by mouth daily., Disp: 30 tablet, Rfl: 6 .  aspirin 81 MG tablet, TAKE 81 MG BY MOUTH EVERY DAY, Disp: 30 tablet, Rfl: 3 .  atorvastatin (LIPITOR) 40 MG tablet, Take 40 mg by mouth daily., Disp: , Rfl:  .  carvedilol (COREG) 25 MG tablet, Take 2 tablets (50 mg total) by mouth 2 (two) times daily with a meal., Disp: 120 tablet, Rfl: 3 .  cloNIDine (CATAPRES)  0.2 MG tablet, Take 2 tablets (0.4 mg total) by mouth 2 (two) times daily., Disp: 120 tablet, Rfl: 3 .  doxazosin (CARDURA) 8 MG tablet, Take 1 tablet (8 mg total) by mouth 2 (two) times daily., Disp: 60 tablet, Rfl: 6 .  ferrous sulfate (FERROUSUL) 325 (65 FE) MG tablet, Take 1 tablet (325 mg total) by mouth 2 (two) times daily with a meal., Disp: 60 tablet, Rfl: 3 .  hydrALAZINE (APRESOLINE) 100 MG tablet, Take 100 mg by mouth every 8 (eight) hours., Disp: , Rfl:  .  insulin aspart (NOVOLOG FLEXPEN) 100 UNIT/ML FlexPen, Inject 12 Units into the skin 2 (two) times daily., Disp: , Rfl:  .  Insulin Glargine (LANTUS SOLOSTAR) 100 UNIT/ML Solostar Pen, Inject 40 Units into the skin every morning., Disp: , Rfl:  .  isosorbide mononitrate (IMDUR) 60 MG 24 hr tablet, TAKE 1 TABLET BY MOUTH DAILY., Disp: 30 tablet, Rfl: 3 .  levothyroxine (SYNTHROID, LEVOTHROID) 50 MCG tablet, TAKE ONE (1) TABLET BY MOUTH EVERY DAY BEFORE BREAKFAST, Disp: 30 tablet, Rfl: 3 .  pantoprazole (PROTONIX) 40 MG tablet, TAKE ONE (1) TABLET BY MOUTH EVERY DAY, Disp: 30 tablet, Rfl: 3 .  potassium chloride SA (K-DUR,KLOR-CON) 20 MEQ tablet, Take 20 mEq by mouth daily., Disp: , Rfl:  .  nitroGLYCERIN (NITROSTAT) 0.4 MG SL tablet, Place 1 tablet (0.4 mg total) under the tongue every 5 (five) minutes as needed for chest pain.,  Disp: 30 tablet, Rfl: 12 .  torsemide (DEMADEX) 20 MG tablet, Take 3 tablets (60 mg total) by mouth 2 (two) times daily. May take an extra 20 mg Daily for weight gain above 212 lbs, Disp: 190 tablet, Rfl: 3 Allergies  Allergen Reactions  . No Known Allergies      Social History   Social History  . Marital status: Single    Spouse name: N/A  . Number of children: N/A  . Years of education: N/A   Occupational History  . Not on file.   Social History Main Topics  . Smoking status: Former Smoker    Packs/day: 1.00    Years: 46.00    Types: Cigarettes    Start date: 02/04/1967    Quit date: 02/03/2013   . Smokeless tobacco: Never Used     Comment: Doing well with quitting.  . Alcohol use No  . Drug use: No  . Sexual activity: Not Currently   Other Topics Concern  . Not on file   Social History Narrative   Lives with sister, [name] in [place].  Retired - former Sports coach.    Physical Exam      Future Appointments Date Time Provider [place]  09/05/2016 10:00 AM [facility] LAB [facility] None  09/11/2016 2:45 PM [facility] LAB 5 [facility] None  09/11/2016 3:15 PM [facility] INJ NURSE [facility] None  09/17/2016 11:30 AM [facility] PA/NP [facility] None  09/25/2016 2:45 PM [facility] LAB 5 [facility] None  09/25/2016 3:15 PM [facility] INJ NURSE [facility] None  10/09/2016 2:45 PM [facility] LAB 5 [facility] None  10/09/2016 3:15 PM [facility] INJ NURSE [facility] None  10/23/2016 2:45 PM [facility] LAB 2 [facility] None  10/23/2016 3:15 PM [facility] INJ NURSE [facility] None  10/29/2016 10:00 AM Gardiner Barefoot, DPM [facility]  11/06/2016 2:45 PM [facility] LAB 5 [facility] None  11/06/2016 3:15 PM [facility] INJ NURSE [facility] None  11/20/2016 2:45 PM [facility] LAB 6 [facility] None  11/20/2016 3:15 PM Alvy Bimler, Ni, MD [facility] None  11/20/2016 3:45 PM [facility] INJ NURSE [facility] None  12/19/2016 2:00 PM [facility] VASC 3 [facility]  12/19/2016 2:45 PM Nickel, Sharmon Leyden, NP [facility]    ATF pt gone to the [facility] clinic, (i was unaware of this am appointment), his sister advised me that he was gone to the doctor but she didn't know which one. She also advised me that he went to the ED over the weekend because his leg was hurting him and it was getting "darker".  I read the notes from his ED visit and the notes from his appointment today. His pill box was filled according to the notes that Jinny Blossom put in today, I called the triage nurse and left a message advising the same. I will follow up with pt on Friday.       Delene Morais, EMT  Paramedic 09/02/2016    ACTION: Home visit completed

## 2016-09-02 NOTE — Assessment & Plan Note (Signed)
LLE pulses intact although somewhat hard to find.  Overlying skin on dorsum of foot with slight discoloration.  Sensation intact with 1+ pitting edema compared to right foot.  Recent US doppler LLE on 7/28 revealed chronic deep vein thrombosis involving the mid femoral vein of the left lower extremity.  Pt on Aspirin 81 mg daily.  Follows with Dr. Aundra Dubin and has appointment scheduled for tomorrow 7/31.  Symptoms likely 2/2 fluid accumulation in LE.   -Recommend increasing Torsemide to 80 mg BID today and follow up with Cardiology tomorrow -Advised pt to use compression stockings at home  -Elevate legs when sitting or supine  -Will reassess fluid status at next appointment  -Follow up with PCP in 1 mo

## 2016-09-02 NOTE — Patient Instructions (Signed)
INCREASE Torsemide to 80 mg (4 tabs) twice daily for THREE DAYS ONLY. Then reduce back down to 60 mg (3 tabs) twice daily.  Return this Friday 8/3 for lab work.  Follow up 2 weeks. Take all medication as prescribed the day of your appointment. Bring all medications with you to your appointment.  Do the following things EVERYDAY: 1) Weigh yourself in the morning before breakfast. Write it down and keep it in a log. 2) Take your medicines as prescribed 3) Eat low salt foods-Limit salt (sodium) to 2000 mg per day.  4) Stay as active as you can everyday 5) Limit all fluids for the day to less than 2 liters

## 2016-09-02 NOTE — Progress Notes (Signed)
    ReDS Vest - 09/02/16 1100      ReDS Vest   MR  No   Fitting Posture Standing   Height Marker Tall   Ruler Value 11   Center Strip Aligned   ReDS Value 52    '

## 2016-09-03 ENCOUNTER — Telehealth (HOSPITAL_COMMUNITY): Payer: Self-pay | Admitting: Pharmacist

## 2016-09-03 DIAGNOSIS — I82512 Chronic embolism and thrombosis of left femoral vein: Secondary | ICD-10-CM | POA: Insufficient documentation

## 2016-09-03 HISTORY — DX: Chronic embolism and thrombosis of left femoral vein: I82.512

## 2016-09-03 MED ORDER — WARFARIN SODIUM 5 MG PO TABS: 5.0000 mg | ORAL_TABLET | Freq: Every day | ORAL | 1 refills | Status: DC

## 2016-09-03 NOTE — Telephone Encounter (Signed)
Per discussion with Jettie Booze, NP-C, will start warfarin 5 mg daily without lovenox bridge for left LE DVT diagnosed on 08/30/16. Referred to [facility] for management. Spoke with Mr. Laduca who verbalized understanding to start warfarin today and look out for a call from [facility] to schedule his first INR visit. He was also counseled on the Coumadin yesterday in clinic.   Ruta Hinds. Velva Harman, PharmD, BCPS, CPP Clinical Pharmacist Pager: [phone] Phone: [phone] 09/03/2016 12:11 PM

## 2016-09-05 ENCOUNTER — Other Ambulatory Visit (HOSPITAL_COMMUNITY): Payer: Self-pay | Admitting: *Deleted

## 2016-09-05 ENCOUNTER — Ambulatory Visit (HOSPITAL_COMMUNITY)
Admission: RE | Admit: 2016-09-05 | Discharge: 2016-09-05 | Disposition: A | Discharge status: Home / Self Care | Payer: Medicare Other | Source: Ambulatory Visit | Attending: Internal Medicine | Admitting: Internal Medicine

## 2016-09-05 DIAGNOSIS — I5042 Chronic combined systolic (congestive) and diastolic (congestive) heart failure: Secondary | ICD-10-CM

## 2016-09-05 DIAGNOSIS — I5022 Chronic systolic (congestive) heart failure: Secondary | ICD-10-CM

## 2016-09-05 DIAGNOSIS — I5033 Acute on chronic diastolic (congestive) heart failure: Secondary | ICD-10-CM | POA: Diagnosis not present

## 2016-09-05 LAB — BASIC METABOLIC PANEL
Anion gap: 11 (ref 5–15)
BUN: 59 mg/dL — AB (ref 6–20)
CALCIUM: 8.4 mg/dL — AB (ref 8.9–10.3)
CO2: 23 mmol/L (ref 22–32)
CREATININE: 3.28 mg/dL — AB (ref 0.61–1.24)
Chloride: 100 mmol/L — ABNORMAL LOW (ref 101–111)
GFR calc Af Amer: 21 mL/min — ABNORMAL LOW (ref >60)
GFR, EST NON AFRICAN AMERICAN: 18 mL/min — AB (ref >60)
GLUCOSE: 245 mg/dL — AB (ref 65–99)
POTASSIUM: 3.8 mmol/L (ref 3.5–5.1)
SODIUM: 134 mmol/L — AB (ref 135–145)

## 2016-09-05 NOTE — Addendum Note (Signed)
Encounter addended by: Kerry Dory, CMA on: 09/05/2016 10:40 AM<BR>    Actions taken: Visit diagnoses modified, Order list changed, Diagnosis association updated

## 2016-09-08 ENCOUNTER — Telehealth: Payer: Self-pay | Admitting: Podiatry

## 2016-09-08 NOTE — Telephone Encounter (Signed)
Spoke to pt and notified him Dr Alta Corning is not responding to the fax for pt to get diabetic shoes. I asked pt when was he was last seen and he said it has been a while. I recommended pt to make an appt with Dr Alta Corning and to let us know and we can refax [employer]worl

## 2016-09-09 ENCOUNTER — Other Ambulatory Visit (HOSPITAL_COMMUNITY): Payer: Self-pay

## 2016-09-09 ENCOUNTER — Telehealth: Payer: Self-pay | Admitting: Family Medicine

## 2016-09-09 ENCOUNTER — Ambulatory Visit (INDEPENDENT_AMBULATORY_CARE_PROVIDER_SITE_OTHER): Payer: Medicare Other

## 2016-09-09 DIAGNOSIS — I82512 Chronic embolism and thrombosis of left femoral vein: Secondary | ICD-10-CM | POA: Diagnosis not present

## 2016-09-09 DIAGNOSIS — I779 Disorder of arteries and arterioles, unspecified: Secondary | ICD-10-CM

## 2016-09-09 DIAGNOSIS — Z7901 Long term (current) use of anticoagulants: Secondary | ICD-10-CM | POA: Diagnosis not present

## 2016-09-09 HISTORY — DX: Long term (current) use of anticoagulants: Z79.01

## 2016-09-09 LAB — POCT INR: INR: 2.1

## 2016-09-09 NOTE — Progress Notes (Signed)
Paramedicine Encounter    Patient ID: Leonard [name], male    DOB: [birth year], 66 y.o.   MRN: [id number]    Patient Care Team: [name], MD as PCP - General (Family Medicine) [name], MD as Consulting Physician (Nephrology)  Patient Active Problem List   Diagnosis Date Noted  . Chronic deep vein thrombosis (DVT) of femoral vein of left lower extremity ([place]) 09/03/2016  . Pleural effusion   . Oxygen desaturation   . Morbid obesity due to excess calories ([place]) 03/17/2016  . Anemia due to stage 4 chronic kidney disease treated with darbepoetin ([place]) 02/28/2016  . Fatigue 01/21/2016  . Diabetic retinopathy ([place]) 11/13/2015  . Dyspnea 08/28/2015  . CHF (congestive heart failure) ([place]) 08/27/2015  . Iron deficiency anemia 08/03/2015  . Claudication ([place])   . Type 2 diabetes mellitus with stage 3 chronic kidney disease, with long-term current use of insulin ([place])   . Secondary hypertension   . Hematuria 06/27/2015  . Skin lesion of face 02/22/2015  . 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 Deficiency anemia 01/15/2015  . CKD (chronic kidney disease) stage 4, GFR 15-29 ml/min (HCC) 01/15/2015  . Hypothyroidism 11/22/2014  . Acute on chronic diastolic congestive heart failure ([place]) 07/04/2014  . COPD GOLD III  02/16/2014  . History of tobacco use 08/23/2013  . 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 CAD (coronary artery disease) 04/06/2013  . PVD - hx of Rt SFA PTA and s/p Rt 4-5th toe amp Dec 2014 04/05/2013  . Essential hypertension 11/25/2012  . Mixed hyperlipidemia 07/17/2008    Current Outpatient Prescriptions:  .  amLODipine (NORVASC) 10 MG tablet, Take 1 tablet (10 mg total) by mouth daily., Disp: 30 tablet, Rfl: 6 .  aspirin 81 MG tablet, TAKE 81 MG BY MOUTH EVERY DAY, Disp: 30 tablet, Rfl: 3 .  atorvastatin (LIPITOR) 40 MG tablet, Take 40 mg by mouth daily., Disp: , Rfl:  .  carvedilol (COREG) 25 MG tablet, Take 2 tablets (50 mg  total) by mouth 2 (two) times daily with a meal., Disp: 120 tablet, Rfl: 3 .  cloNIDine (CATAPRES) 0.2 MG tablet, Take 2 tablets (0.4 mg total) by mouth 2 (two) times daily., Disp: 120 tablet, Rfl: 3 .  doxazosin (CARDURA) 8 MG tablet, Take 1 tablet (8 mg total) by mouth 2 (two) times daily., Disp: 60 tablet, Rfl: 6 .  ferrous sulfate (FERROUSUL) 325 (65 FE) MG tablet, Take 1 tablet (325 mg total) by mouth 2 (two) times daily with a meal., Disp: 60 tablet, Rfl: 3 .  hydrALAZINE (APRESOLINE) 100 MG tablet, Take 100 mg by mouth every 8 (eight) hours., Disp: , Rfl:  .  insulin aspart (NOVOLOG FLEXPEN) 100 UNIT/ML FlexPen, Inject 12 Units into the skin 2 (two) times daily., Disp: , Rfl:  .  Insulin Glargine (LANTUS SOLOSTAR) 100 UNIT/ML Solostar Pen, Inject 40 Units into the skin every morning., Disp: , Rfl:  .  isosorbide mononitrate (IMDUR) 60 MG 24 hr tablet, TAKE 1 TABLET BY MOUTH DAILY., Disp: 30 tablet, Rfl: 3 .  levothyroxine (SYNTHROID, LEVOTHROID) 50 MCG tablet, TAKE ONE (1) TABLET BY MOUTH EVERY DAY BEFORE BREAKFAST, Disp: 30 tablet, Rfl: 3 .  pantoprazole (PROTONIX) 40 MG tablet, TAKE ONE (1) TABLET BY MOUTH EVERY DAY, Disp: 30 tablet, Rfl: 3 .  potassium chloride SA (K-DUR,KLOR-CON) 20 MEQ tablet, Take 20 mEq by mouth daily., Disp: , Rfl:  .  torsemide (DEMADEX) 20 MG tablet, Take 3 tablets (  60 mg total) by mouth 2 (two) times daily. May take an extra 20 mg Daily for weight gain above 212 lbs, Disp: 190 tablet, Rfl: 3 .  warfarin (COUMADIN) 5 MG tablet, Take 1 tablet (5 mg total) by mouth daily., Disp: 30 tablet, Rfl: 1 .  nitroGLYCERIN (NITROSTAT) 0.4 MG SL tablet, Place 1 tablet (0.4 mg total) under the tongue every 5 (five) minutes as needed for chest pain., Disp: 30 tablet, Rfl: 12 Allergies  Allergen Reactions  . No Known Allergies      Social History   Social History  . Marital status: Single    Spouse name: N/A  . Number of children: N/A  . Years of education: N/A    Occupational History  . Not on file.   Social History Main Topics  . Smoking status: Former Smoker    Packs/day: 1.00    Years: 46.00    Types: Cigarettes    Start date: 02/04/1967    Quit date: 02/03/2013  . Smokeless tobacco: Never Used     Comment: Doing well with quitting.  . Alcohol use No  . Drug use: No  . Sexual activity: Not Currently   Other Topics Concern  . Not on file   Social History Narrative   Lives with sister, [name] in [place].  Retired - former Sports coach.    Physical Exam  Pulmonary/Chest: No respiratory distress. He has no wheezes. He has no rales.  Abdominal: He exhibits no distension. There is no tenderness. There is no guarding.  Musculoskeletal: He exhibits edema.  Left leg only/pain in left leg also  Skin: Skin is warm and dry. He is not diaphoretic.        Future Appointments Date Time Provider [place]  09/09/2016 2:30 PM [facility]  09/11/2016 2:45 PM [facility] LAB 4 [facility] None  09/11/2016 3:15 PM [facility] FLUSH NURSE 2 [facility] None  09/17/2016 11:30 AM [facility] PA/NP [facility] None  09/25/2016 2:45 PM [facility] LAB 5 [facility] None  09/25/2016 3:15 PM [facility] INJ NURSE [facility] None  10/09/2016 2:45 PM [facility] LAB 5 [facility] None  10/09/2016 3:15 PM [facility] INJ NURSE [facility] None  10/23/2016 2:45 PM [facility] LAB 2 [facility] None  10/23/2016 3:15 PM [facility] INJ NURSE [facility] None  10/29/2016 10:00 AM Gardiner Barefoot, DPM [facility]  11/06/2016 2:45 PM [facility] LAB 5 [facility] None  11/06/2016 3:15 PM [facility] INJ NURSE [facility] None  11/20/2016 2:45 PM [facility] LAB 6 [facility] None  11/20/2016 3:15 PM Gorsuch, Ni, MD [facility] None  11/20/2016 3:45 PM [facility] INJ NURSE [facility] None  12/19/2016 2:00 PM [facility] 3 [facility]  12/19/2016 2:45 PM Nickel, Sharmon Leyden, NP [facility]    ATF pt CAO x4  sitting in the living room c/o left foot pain.  He stated that he has been in pain for the past few days because he doesn't have anything for pain. Pt hasnt taken but 2 evening meds in the past week and didn't take insulin last night.  He also skipped his morning dose of insulin today.  I asked pt what was going on with him missing so much meds, he became annoyed and didn't respond.  Pts vitals noted; he denies sob, chest pain, dizziness and headache.  Pt's rx bottles verified and pill box refilled.     I called dr. Juanito Doom office to report pt's CBG and spoke with emily (Rn). She stated that she will send a message to his  physician and call me back.  I advised pt to recheck his CBG and I will give him a f/u call.   **rx called in: doxazosin  BP (!) 122/54 (BP Location: Left Arm)   Pulse 66   Wt 205 lb (93 kg)   SpO2 98%   BMI 31.17 kg/m    cbg HI (took 40 units @ 11:36)   Weight yesterday-207 Last visit weight-214 @ clinic 7/31    Vadie Principato, EMT Paramedic 09/09/2016    ACTION: Home visit completed

## 2016-09-09 NOTE — Patient Instructions (Signed)

## 2016-09-09 NOTE — Telephone Encounter (Signed)
Spoke with D. Cooper, EMT regarding blood sugars; it was reading high this morning. Patient did not take his evening medications. Patient fell asleep before taking meds.  He took 40 Units of Insulin, complained of being thirsty and left leg pain.   Tried calling to check on patient, but number was busy.  Derl Barrow, RN

## 2016-09-09 NOTE — Telephone Encounter (Signed)
Pt sugars are high. Pt did not take insulin last night or this morning. Pt finally took insulin around 11:40am. Pt reports serve pain in left leg. ep

## 2016-09-11 ENCOUNTER — Other Ambulatory Visit: Payer: Self-pay | Admitting: Hematology and Oncology

## 2016-09-11 ENCOUNTER — Ambulatory Visit (HOSPITAL_COMMUNITY)
Admission: RE | Admit: 2016-09-11 | Discharge: 2016-09-11 | Disposition: A | Discharge status: Home / Self Care | Payer: Medicare Other | Source: Ambulatory Visit | Attending: Hematology and Oncology | Admitting: Hematology and Oncology

## 2016-09-11 ENCOUNTER — Other Ambulatory Visit (HOSPITAL_BASED_OUTPATIENT_CLINIC_OR_DEPARTMENT_OTHER): Payer: Medicare Other

## 2016-09-11 ENCOUNTER — Ambulatory Visit (HOSPITAL_BASED_OUTPATIENT_CLINIC_OR_DEPARTMENT_OTHER): Payer: Medicare Other

## 2016-09-11 VITALS — BP 113/46 | HR 64 | Temp 98.2°F | Resp 18

## 2016-09-11 DIAGNOSIS — D631 Anemia in chronic kidney disease: Secondary | ICD-10-CM

## 2016-09-11 DIAGNOSIS — I5033 Acute on chronic diastolic (congestive) heart failure: Secondary | ICD-10-CM

## 2016-09-11 DIAGNOSIS — N184 Chronic kidney disease, stage 4 (severe): Principal | ICD-10-CM

## 2016-09-11 DIAGNOSIS — N189 Chronic kidney disease, unspecified: Principal | ICD-10-CM

## 2016-09-11 LAB — CBC WITH DIFFERENTIAL/PLATELET
BASO%: 0.3 % (ref 0.0–2.0)
Basophils Absolute: 0 10*3/uL (ref 0.0–0.1)
EOS%: 3.5 % (ref 0.0–7.0)
Eosinophils Absolute: 0.2 10*3/uL (ref 0.0–0.5)
HCT: 25.8 % — ABNORMAL LOW (ref 38.4–49.9)
HGB: 7.9 g/dL — ABNORMAL LOW (ref 13.0–17.1)
LYMPH%: 18.2 % (ref 14.0–49.0)
MCH: 22.1 pg — ABNORMAL LOW (ref 27.2–33.4)
MCHC: 30.6 g/dL — AB (ref 32.0–36.0)
MCV: 72.3 fL — ABNORMAL LOW (ref 79.3–98.0)
MONO#: 0.5 10*3/uL (ref 0.1–0.9)
MONO%: 8.3 % (ref 0.0–14.0)
NEUT%: 69.7 % (ref 39.0–75.0)
NEUTROS ABS: 4.4 10*3/uL (ref 1.5–6.5)
Platelets: 269 10*3/uL (ref 140–400)
RBC: 3.57 10*6/uL — AB (ref 4.20–5.82)
RDW: 19.1 % — ABNORMAL HIGH (ref 11.0–14.6)
WBC: 6.4 10*3/uL (ref 4.0–10.3)
lymph#: 1.2 10*3/uL (ref 0.9–3.3)

## 2016-09-11 LAB — PREPARE RBC (CROSSMATCH)

## 2016-09-11 MED ORDER — DARBEPOETIN ALFA 500 MCG/ML IJ SOSY
500.0000 ug | PREFILLED_SYRINGE | Freq: Once | INTRAMUSCULAR | Status: AC
  Administered 2016-09-11: 500 ug via SUBCUTANEOUS
  Filled 2016-09-11: qty 1

## 2016-09-11 NOTE — Patient Instructions (Signed)

## 2016-09-12 ENCOUNTER — Ambulatory Visit (HOSPITAL_COMMUNITY)
Admission: RE | Admit: 2016-09-12 | Discharge: 2016-09-12 | Disposition: A | Discharge status: Home / Self Care | Payer: Medicare Other | Source: Ambulatory Visit | Attending: Hematology and Oncology | Admitting: Hematology and Oncology

## 2016-09-12 DIAGNOSIS — D631 Anemia in chronic kidney disease: Secondary | ICD-10-CM

## 2016-09-12 DIAGNOSIS — I5033 Acute on chronic diastolic (congestive) heart failure: Secondary | ICD-10-CM | POA: Diagnosis not present

## 2016-09-12 DIAGNOSIS — N184 Chronic kidney disease, stage 4 (severe): Principal | ICD-10-CM

## 2016-09-12 LAB — IRON AND TIBC
%SAT: 8 % — AB (ref 20–55)
Iron: 17 ug/dL — ABNORMAL LOW (ref 42–163)
TIBC: 228 ug/dL (ref 202–409)
UIBC: 210 ug/dL (ref 117–376)

## 2016-09-12 LAB — FERRITIN: FERRITIN: 307 ng/mL (ref 22–316)

## 2016-09-12 MED ORDER — ACETAMINOPHEN 325 MG PO TABS
650.0000 mg | ORAL_TABLET | Freq: Once | ORAL | Status: AC
  Administered 2016-09-12: 650 mg via ORAL
  Filled 2016-09-12: qty 2

## 2016-09-12 MED ORDER — DIPHENHYDRAMINE HCL 25 MG PO CAPS
25.0000 mg | ORAL_CAPSULE | Freq: Once | ORAL | Status: AC
  Administered 2016-09-12: 25 mg via ORAL
  Filled 2016-09-12: qty 1

## 2016-09-12 MED ORDER — SODIUM CHLORIDE 0.9% FLUSH: 3.0000 mL | INTRAVENOUS | Status: DC | PRN

## 2016-09-12 MED ORDER — SODIUM CHLORIDE 0.9% FLUSH: 10.0000 mL | INTRAVENOUS | Status: DC | PRN

## 2016-09-12 MED ORDER — HEPARIN SOD (PORK) LOCK FLUSH 100 UNIT/ML IV SOLN: 500.0000 [IU] | Freq: Every day | INTRAVENOUS | Status: DC | PRN

## 2016-09-12 MED ORDER — SODIUM CHLORIDE 0.9 % IV SOLN
250.0000 mL | Freq: Once | INTRAVENOUS | Status: AC
  Administered 2016-09-12: 250 mL via INTRAVENOUS

## 2016-09-12 MED ORDER — HEPARIN SOD (PORK) LOCK FLUSH 100 UNIT/ML IV SOLN: 250.0000 [IU] | INTRAVENOUS | Status: DC | PRN

## 2016-09-12 NOTE — Progress Notes (Addendum)
Provider: [name]   Procedure: 1 units PRBC    Diagnosis Association: Anemia due to stage 4 chronic kidney disease treated with darbepoetin ([place]) (N18.4 , D63.1)   Treatment: Patient received 1 units PRBC. Patient tolerated procedure well with no transfusion reaction. Discharge instructions given to patient and patient states an understanding. Patient alert, oriented, and ambulatory at time of discharge.

## 2016-09-12 NOTE — Discharge Instructions (Signed)
Blood Transfusion, Adult A blood transfusion is a procedure in which you receive donated blood, including plasma, platelets, and red blood cells, through an IV tube. You may need a blood transfusion because of illness, surgery, or injury. The blood may come from a donor. You may also be able to donate blood for yourself (autologous blood donation) before a surgery if you know that you might require a blood transfusion. The blood given in a transfusion is made up of different types of cells. You may receive:  Red blood cells. These carry oxygen to the cells in the body.  White blood cells. These help you fight infections.  Platelets. These help your blood to clot.  Plasma. This is the liquid part of your blood and it helps with fluid imbalances.  If you have hemophilia or another clotting disorder, you may also receive other types of blood products. Tell a health care provider about:  Any allergies you have.  All medicines you are taking, including vitamins, herbs, eye drops, creams, and over-the-counter medicines.  Any problems you or family members have had with anesthetic medicines.  Any blood disorders you have.  Any surgeries you have had.  Any medical conditions you have, including any recent fever or cold symptoms.  Whether you are pregnant or may be pregnant.  Any previous reactions you have had during a blood transfusion. What are the risks? Generally, this is a safe procedure. However, problems may occur, including:  Having an allergic reaction to something in the donated blood. Hives and itching may be symptoms of this type of reaction.  Fever. This may be a reaction to the white blood cells in the transfused blood. Nausea or chest pain may accompany a fever.  Iron overload. This can happen from having many transfusions.  Transfusion-related acute lung injury (TRALI). This is a rare reaction that causes lung damage. The cause is not known.TRALI can occur within hours  of a transfusion or several days later.  Sudden (acute) or delayed hemolytic reactions. This happens if your blood does not match the cells in your transfusion. Your body's defense system (immune system) may try to attack the new cells. This complication is rare. The symptoms include fever, chills, nausea, and low back pain or chest pain.  Infection or disease transmission. This is rare.  What happens before the procedure?  You will have a blood test to determine your blood type. This is necessary to know what kind of blood your body will accept and to match it to the donor blood.  If you are going to have a planned surgery, you may be able to do an autologous blood donation. This may be done in case you need to have a transfusion.  If you have had an allergic reaction to a transfusion in the past, you may be given medicine to help prevent a reaction. This medicine may be given to you by mouth or through an IV tube.  You will have your temperature, blood pressure, and pulse monitored before the transfusion.  Follow instructions from your health care provider about eating and drinking restrictions.  Ask your health care provider about: ? Changing or stopping your regular medicines. This is especially important if you are taking diabetes medicines or blood thinners. ? Taking medicines such as aspirin and ibuprofen. These medicines can thin your blood. Do not take these medicines before your procedure if your health care provider instructs you not to. What happens during the procedure?  An IV tube will   be inserted into one of your veins.  The bag of donated blood will be attached to your IV tube. The blood will then enter through your vein.  Your temperature, blood pressure, and pulse will be monitored regularly during the transfusion. This monitoring is done to detect early signs of a transfusion reaction.  If you have any signs or symptoms of a reaction, your transfusion will be stopped  and you may be given medicine.  When the transfusion is complete, your IV tube will be removed.  Pressure may be applied to the IV site for a few minutes.  A bandage (dressing) will be applied. The procedure may vary among health care providers and hospitals. What happens after the procedure?  Your temperature, blood pressure, heart rate, breathing rate, and blood oxygen level will be monitored often.  Your blood may be tested to see how you are responding to the transfusion.  You may be warmed with fluids or blankets to maintain a normal body temperature. Summary  A blood transfusion is a procedure in which you receive donated blood, including plasma, platelets, and red blood cells, through an IV tube.  Your temperature, blood pressure, and pulse will be monitored before, during, and after the transfusion.  Your blood may be tested after the transfusion to see how your body has responded. This information is not intended to replace advice given to you by your health care provider. Make sure you discuss any questions you have with your health care provider. Document Released: 01/18/2000 Document Revised: 10/18/2015 Document Reviewed: 10/18/2015 Elsevier Interactive Patient Education  2018 Elsevier Inc.  Blood Transfusion, Care After This sheet gives you information about how to care for yourself after your procedure. Your doctor may also give you more specific instructions. If you have problems or questions, contact your doctor. Follow these instructions at home:  Take over-the-counter and prescription medicines only as told by your doctor.  Go back to your normal activities as told by your doctor.  Follow instructions from your doctor about how to take care of the area where an IV tube was put into your vein (insertion site). Make sure you: ? Wash your hands with soap and water before you change your bandage (dressing). If there is no soap and water, use hand sanitizer. ? Change  your bandage as told by your doctor.  Check your IV insertion site every day for signs of infection. Check for: ? More redness, swelling, or pain. ? More fluid or blood. ? Warmth. ? Pus or a bad smell. Contact a doctor if:  You have more redness, swelling, or pain around the IV insertion site..  You have more fluid or blood coming from the IV insertion site.  Your IV insertion site feels warm to the touch.  You have pus or a bad smell coming from the IV insertion site.  Your pee (urine) turns pink, red, or brown.  You feel weak after doing your normal activities. Get help right away if:  You have signs of a serious allergic or body defense (immune) system reaction, including: ? Itchiness. ? Hives. ? Trouble breathing. ? Anxiety. ? Pain in your chest or lower back. ? Fever, flushing, and chills. ? Fast pulse. ? Rash. ? Watery poop (diarrhea). ? Throwing up (vomiting). ? Dark pee. ? Serious headache. ? Dizziness. ? Stiff neck. ? Yellow color in your face or the white parts of your eyes (jaundice). Summary  After a blood transfusion, return to your normal activities as told   by your doctor.  Every day, check for signs of infection where the IV tube was put into your vein.  Some signs of infection are warm skin, more redness and pain, more fluid or blood, and pus or a bad smell where the needle went in.  Contact your doctor if you feel weak or have any unusual symptoms. This information is not intended to replace advice given to you by your health care provider. Make sure you discuss any questions you have with your health care provider. Document Released: 02/10/2014 Document Revised: 09/14/2015 Document Reviewed: 09/14/2015 Elsevier Interactive Patient Education  2017 Elsevier Inc.  

## 2016-09-13 LAB — TYPE AND SCREEN
ABO/RH(D): B NEG
ANTIBODY SCREEN: NEGATIVE
UNIT DIVISION: 0

## 2016-09-13 LAB — BPAM RBC
BLOOD PRODUCT EXPIRATION DATE: 201808282359
ISSUE DATE / TIME: 201808101153
UNIT TYPE AND RH: 9500

## 2016-09-16 ENCOUNTER — Ambulatory Visit (INDEPENDENT_AMBULATORY_CARE_PROVIDER_SITE_OTHER): Payer: Medicare Other

## 2016-09-16 ENCOUNTER — Other Ambulatory Visit (HOSPITAL_COMMUNITY): Payer: Self-pay

## 2016-09-16 DIAGNOSIS — Z7901 Long term (current) use of anticoagulants: Secondary | ICD-10-CM | POA: Diagnosis not present

## 2016-09-16 DIAGNOSIS — I779 Disorder of arteries and arterioles, unspecified: Secondary | ICD-10-CM

## 2016-09-16 DIAGNOSIS — I82512 Chronic embolism and thrombosis of left femoral vein: Secondary | ICD-10-CM | POA: Diagnosis not present

## 2016-09-16 LAB — POCT INR: INR: 4.9

## 2016-09-16 NOTE — Progress Notes (Signed)
Paramedicine Encounter    Patient ID: Leonard [name], male    DOB: Jun 08, 1950, 66 y.o.   MRN: [id number]    Patient Care Team: [name], MD as PCP - General (Family Medicine) [name], MD as Consulting Physician (Nephrology)  Patient Active Problem List   Diagnosis Date Noted  . Long term (current) use of anticoagulants [Z79.01] 09/09/2016  . Chronic deep vein thrombosis (DVT) of femoral vein of left lower extremity ([place]) 09/03/2016  . Pleural effusion   . Oxygen desaturation   . Morbid obesity due to excess calories ([place]) 03/17/2016  . Anemia due to stage 4 chronic kidney disease treated with darbepoetin ([place]) 02/28/2016  . Fatigue 01/21/2016  . Diabetic retinopathy ([place]) 11/13/2015  . Dyspnea 08/28/2015  . CHF (congestive heart failure) ([place]) 08/27/2015  . Iron deficiency anemia 08/03/2015  . Claudication ([place])   . Type 2 diabetes mellitus with stage 3 chronic kidney disease, with long-term current use of insulin ([place])   . Secondary hypertension   . Hematuria 06/27/2015  . Skin lesion of face 02/22/2015  . 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 Deficiency anemia 01/15/2015  . CKD (chronic kidney disease) stage 4, GFR 15-29 ml/min ([facility]) 01/15/2015  . Hypothyroidism 11/22/2014  . Acute on chronic diastolic congestive heart failure ([place]) 07/04/2014  . COPD GOLD III  02/16/2014  . History of tobacco use 08/23/2013  . 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 CAD (coronary artery disease) 04/06/2013  . PVD - hx of Rt SFA PTA and s/p Rt 4-5th toe amp Dec 2014 04/05/2013  . Essential hypertension 11/25/2012  . Mixed hyperlipidemia 07/17/2008    Current Outpatient Prescriptions:  .  amLODipine (NORVASC) 10 MG tablet, Take 1 tablet (10 mg total) by mouth daily., Disp: 30 tablet, Rfl: 6 .  aspirin 81 MG tablet, TAKE 81 MG BY MOUTH EVERY DAY, Disp: 30 tablet, Rfl: 3 .  atorvastatin (LIPITOR) 40 MG tablet, Take 40 mg by mouth daily., Disp: , Rfl:   .  carvedilol (COREG) 25 MG tablet, Take 2 tablets (50 mg total) by mouth 2 (two) times daily with a meal., Disp: 120 tablet, Rfl: 3 .  cloNIDine (CATAPRES) 0.2 MG tablet, Take 2 tablets (0.4 mg total) by mouth 2 (two) times daily., Disp: 120 tablet, Rfl: 3 .  doxazosin (CARDURA) 8 MG tablet, Take 1 tablet (8 mg total) by mouth 2 (two) times daily., Disp: 60 tablet, Rfl: 6 .  ferrous sulfate (FERROUSUL) 325 (65 FE) MG tablet, Take 1 tablet (325 mg total) by mouth 2 (two) times daily with a meal., Disp: 60 tablet, Rfl: 3 .  hydrALAZINE (APRESOLINE) 100 MG tablet, Take 100 mg by mouth every 8 (eight) hours., Disp: , Rfl:  .  isosorbide mononitrate (IMDUR) 60 MG 24 hr tablet, TAKE 1 TABLET BY MOUTH DAILY., Disp: 30 tablet, Rfl: 3 .  levothyroxine (SYNTHROID, LEVOTHROID) 50 MCG tablet, TAKE ONE (1) TABLET BY MOUTH EVERY DAY BEFORE BREAKFAST, Disp: 30 tablet, Rfl: 3 .  pantoprazole (PROTONIX) 40 MG tablet, TAKE ONE (1) TABLET BY MOUTH EVERY DAY, Disp: 30 tablet, Rfl: 3 .  potassium chloride SA (K-DUR,KLOR-CON) 20 MEQ tablet, Take 20 mEq by mouth daily., Disp: , Rfl:  .  torsemide (DEMADEX) 20 MG tablet, Take 3 tablets (60 mg total) by mouth 2 (two) times daily. May take an extra 20 mg Daily for weight gain above 212 lbs, Disp: 190 tablet, Rfl: 3 .  warfarin (COUMADIN) 5 MG tablet, Take  1 tablet (5 mg total) by mouth daily., Disp: 30 tablet, Rfl: 1 .  insulin aspart (NOVOLOG FLEXPEN) 100 UNIT/ML FlexPen, Inject 12 Units into the skin 2 (two) times daily., Disp: , Rfl:  .  Insulin Glargine (LANTUS SOLOSTAR) 100 UNIT/ML Solostar Pen, Inject 40 Units into the skin every morning., Disp: , Rfl:  .  nitroGLYCERIN (NITROSTAT) 0.4 MG SL tablet, Place 1 tablet (0.4 mg total) under the tongue every 5 (five) minutes as needed for chest pain., Disp: 30 tablet, Rfl: 12 Allergies  Allergen Reactions  . No Known Allergies      Social History   Social History  . Marital status: Single    Spouse name: N/A  .  Number of children: N/A  . Years of education: N/A   Occupational History  . Not on file.   Social History Main Topics  . Smoking status: Former Smoker    Packs/day: 1.00    Years: 46.00    Types: Cigarettes    Start date: 02/04/1967    Quit date: 02/03/2013  . Smokeless tobacco: Never Used     Comment: Doing well with quitting.  . Alcohol use No  . Drug use: No  . Sexual activity: Not Currently   Other Topics Concern  . Not on file   Social History Narrative   Lives with sister, [name] in [place].  Retired - former Sports coach.    Physical Exam  Pulmonary/Chest: No respiratory distress. He has no wheezes. He has no rales.  Abdominal: He exhibits no distension. There is no tenderness. There is no guarding.  Musculoskeletal: He exhibits edema.  Skin: Skin is warm and dry. He is not diaphoretic.        Future Appointments Date Time Provider [place]  09/17/2016 11:30 AM [facility] PA/NP [facility] None  09/23/2016 9:30 AM [facility]  09/25/2016 2:45 PM [facility] LAB 5 [facility] None  09/25/2016 3:15 PM [facility] FLUSH NURSE 2 [facility] None  10/09/2016 2:45 PM [facility] LAB 5 [facility] None  10/09/2016 3:15 PM [facility] INJ NURSE [facility] None  10/23/2016 2:45 PM [facility] LAB 2 [facility] None  10/23/2016 3:15 PM [facility] INJ NURSE [facility] None  10/29/2016 10:00 AM Gardiner Barefoot, DPM [facility]  11/06/2016 2:45 PM [facility] LAB 5 [facility] None  11/06/2016 3:15 PM [facility] INJ NURSE [facility] None  11/20/2016 2:45 PM [facility] LAB 6 [facility] None  11/20/2016 3:15 PM Gorsuch, Ni, MD [facility] None  11/20/2016 3:45 PM [facility] INJ NURSE [facility] None  12/19/2016 2:00 PM [facility] VASC 3 [facility]  12/19/2016 2:45 PM Nickel, Sharmon Leyden, NP [facility]    ATF pt CAO x4 sitting in his recliner sleeping.  Pt's left leg is sill swollen, therefore I elevated it. Pt has  taken all of his medications this week.  Pt's sister on scene and cleaned out pt's refigerator after I told her the foods that he shouldn't eat.  Pt  Is still eating ham sandwiches and is not watching his sodium intake.  Pt has taken the morning meds today including insulin.  Pt's vitals noted.  Pt's sister request that they see a physician tomorrow instead of a PA/NR due to the "change" in the appearance of pt's left leg.  I advised her that the leg is a little more swollen today but other than that the appearance is the same for a long time.  rx bottles verified and pill box refilled.   **rx called in: Amlodipine Torsemide (filled until  sat 2pills in sat) afternoon only  BP (!) 132/58 (BP Location: Right Arm, Patient Position: Sitting, Cuff Size: Normal)   Pulse 63   Resp 12   Wt 207 lb (93.9 kg)   SpO2 98%   BMI 31.47 kg/m    cbg 235 **coumadin 5mg  mon/fri; 2.5mg  sun, tues, wed, thurs, sat  Weight yesterday-204 Last visit weight-205    Blakelyn Dinges, EMT Paramedic 09/16/2016    ACTION: Home visit completed Next visit planned for friday

## 2016-09-17 ENCOUNTER — Ambulatory Visit (HOSPITAL_COMMUNITY)
Admission: RE | Admit: 2016-09-17 | Discharge: 2016-09-17 | Disposition: A | Discharge status: Home / Self Care | Payer: Medicare Other | Source: Ambulatory Visit | Attending: Internal Medicine | Admitting: Internal Medicine

## 2016-09-17 VITALS — BP 116/56 | HR 66 | Wt 200.4 lb

## 2016-09-17 DIAGNOSIS — I5022 Chronic systolic (congestive) heart failure: Secondary | ICD-10-CM | POA: Diagnosis not present

## 2016-09-17 DIAGNOSIS — M10072 Idiopathic gout, left ankle and foot: Secondary | ICD-10-CM | POA: Diagnosis not present

## 2016-09-17 DIAGNOSIS — M79606 Pain in leg, unspecified: Secondary | ICD-10-CM | POA: Diagnosis not present

## 2016-09-17 DIAGNOSIS — N184 Chronic kidney disease, stage 4 (severe): Secondary | ICD-10-CM | POA: Diagnosis not present

## 2016-09-17 LAB — BASIC METABOLIC PANEL
Anion gap: 12 (ref 5–15)
BUN: 80 mg/dL — AB (ref 6–20)
CALCIUM: 8.3 mg/dL — AB (ref 8.9–10.3)
CHLORIDE: 97 mmol/L — AB (ref 101–111)
CO2: 23 mmol/L (ref 22–32)
CREATININE: 3.74 mg/dL — AB (ref 0.61–1.24)
GFR calc non Af Amer: 16 mL/min — ABNORMAL LOW (ref >60)
GFR, EST AFRICAN AMERICAN: 18 mL/min — AB (ref >60)
Glucose, Bld: 337 mg/dL — ABNORMAL HIGH (ref 65–99)
Potassium: 4.8 mmol/L (ref 3.5–5.1)
Sodium: 132 mmol/L — ABNORMAL LOW (ref 135–145)

## 2016-09-17 LAB — URIC ACID: Uric Acid, Serum: 10.9 mg/dL — ABNORMAL HIGH (ref 4.4–7.6)

## 2016-09-17 MED ORDER — COLCHICINE 0.6 MG PO TABS: 0.6000 mg | ORAL_TABLET | Freq: Every day | ORAL | 0 refills | Status: DC

## 2016-09-17 MED ORDER — TORSEMIDE 20 MG PO TABS
60.0000 mg | ORAL_TABLET | Freq: Every day | ORAL | 6 refills | Status: DC
Start: 2016-09-17 — End: 2017-07-05

## 2016-09-17 MED ORDER — PREDNISONE 20 MG PO TABS
40.0000 mg | ORAL_TABLET | Freq: Every day | ORAL | 0 refills | Status: DC
Start: 2016-09-17 — End: 2016-09-23

## 2016-09-17 MED ORDER — ALLOPURINOL 100 MG PO TABS: 100.0000 mg | ORAL_TABLET | Freq: Every day | ORAL | 6 refills | Status: DC

## 2016-09-17 NOTE — Progress Notes (Signed)
PCP: Dr. Juanito Doom Cardiology: Dr. Aundra Dubin Nephrology: Dr Marval Regal.   66 yo with history of CAD s/p CABG, PAD, chronic diastolic CHF, CKD stage IV, COPD on home oxygen, and HTN presents for cardiology followup.  He has had multiple admissions this year for diastolic CHF.  Last echo in 5/17 showed EF 60-65% with grade II diastolic dysfunction.  He also has COPD with PFTs showing severe obstruction.    Leonard [name] returns today for HF follow up. His weight is down 14 pounds since last visit, breathing has improved. His main complaint today is left ankle pain and swelling. He has 2-3+ pedal edema on the right ankle, erythematous, tender to touch. Pain has gotten worse over the past few days. He is taking all of his medications, trying to follow a low sodium diet for the most part. Denies SOB, orthopnea and chest pain.   Labs (8/17): K 3.9, creatinine 2.33, HCT 28.5, LDL 46 Labs (11/17): hgb 9, K 4.4, creatinine 3, BNP 64 Labs 03/24/2016: K 3.5 Creatinine 2.48.  Labs 05/09/2016: K 4.3 Creatinine 3.13 Hgb 9.0   PMH: 1. Chronic diastolic CHF: Multiple recent admissions.  Echo (5/17) with EF 60-65%, grade II diastolic dysfunction, normal RV size and systolic function.  2. PAD: Right femoral PCI in 12/14. Peripheral arterial dopplers in 6/17 with occluded right SFA.  He follows with [facility].  3. HTN: Renal artery dopplers negative in 5/17.  4. Hypothyroidism 5. CKD stage IV: Follows with Dr. Marval Regal. 6. CAD: s/p CABG x 3 in 3/15.  7. OSA: Mild, not on CPAP.  8. COPD: PFTs (3/15) with FVC 77%, FEV1 47%, ratio 59%, TLC 55% => severe obstruction.  Prior smoker. He is on 2 L home O2.  9. 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 Hyperlipidemia 12. Anemia of renal disease   Social History   Social History  . Marital status: Single    Spouse name: N/A  . Number of children: N/A  . Years of education: N/A   Occupational History  . Not on file.   Social History Main Topics  . Smoking status: Former Smoker   Packs/day: 1.00    Years: 46.00    Types: Cigarettes    Start date: 02/04/1967    Quit date: 02/03/2013  . Smokeless tobacco: Never Used     Comment: Doing well with quitting.  . Alcohol use No  . Drug use: No  . Sexual activity: Not Currently   Other Topics Concern  . Not on file   Social History Narrative   Lives with sister, Leonard [name] in [place].  Retired - former Sports coach.   Family History  Problem Relation Age of Onset  . Cancer Mother        lung cancer  . Hypertension Mother   . Cancer Sister        patient thinks it was uterine cancer  . Hypertension Sister   . Heart attack Sister   . Stroke Neg Hx    ROS: All systems reviewed and negative except as per HPI.   Current Outpatient Prescriptions  Medication Sig Dispense Refill  . amLODipine (NORVASC) 10 MG tablet Take 1 tablet (10 mg total) by mouth daily. 30 tablet 6  . aspirin 81 MG tablet TAKE 81 MG BY MOUTH EVERY DAY 30 tablet 3  . atorvastatin (LIPITOR) 40 MG tablet Take 40 mg by mouth daily.    . carvedilol (COREG) 25 MG tablet Take 2 tablets (50 mg total) by mouth 2 (two)  times daily with a meal. 120 tablet 3  . cloNIDine (CATAPRES) 0.2 MG tablet Take 2 tablets (0.4 mg total) by mouth 2 (two) times daily. 120 tablet 3  . doxazosin (CARDURA) 8 MG tablet Take 1 tablet (8 mg total) by mouth 2 (two) times daily. 60 tablet 6  . ferrous sulfate (FERROUSUL) 325 (65 FE) MG tablet Take 1 tablet (325 mg total) by mouth 2 (two) times daily with a meal. 60 tablet 3  . hydrALAZINE (APRESOLINE) 100 MG tablet Take 100 mg by mouth every 8 (eight) hours.    . insulin aspart (NOVOLOG FLEXPEN) 100 UNIT/ML FlexPen Inject 12 Units into the skin 2 (two) times daily.    . Insulin Glargine (LANTUS SOLOSTAR) 100 UNIT/ML Solostar Pen Inject 40 Units into the skin every morning.    . isosorbide mononitrate (IMDUR) 60 MG 24 hr tablet TAKE 1 TABLET BY MOUTH DAILY. 30 tablet 3  . levothyroxine (SYNTHROID, LEVOTHROID) 50 MCG tablet TAKE  ONE (1) TABLET BY MOUTH EVERY DAY BEFORE BREAKFAST 30 tablet 3  . pantoprazole (PROTONIX) 40 MG tablet TAKE ONE (1) TABLET BY MOUTH EVERY DAY 30 tablet 3  . potassium chloride SA (K-DUR,KLOR-CON) 20 MEQ tablet Take 20 mEq by mouth daily.    Marland Kitchen torsemide (DEMADEX) 20 MG tablet Take 3 tablets (60 mg total) by mouth 2 (two) times daily. May take an extra 20 mg Daily for weight gain above 212 lbs 190 tablet 3  . warfarin (COUMADIN) 5 MG tablet Take 2.5 mg (1/2 tablet) daily except 5 mg (1 tablet) on Monday and Friday    . nitroGLYCERIN (NITROSTAT) 0.4 MG SL tablet Place 1 tablet (0.4 mg total) under the tongue every 5 (five) minutes as needed for chest pain. (Patient not taking: Reported on 09/17/2016) 30 tablet 12   No current facility-administered medications for this encounter.      BP (!) 116/56 (BP Location: Left Arm, Patient Position: Sitting, Cuff Size: Normal)   Pulse 66   Wt 200 lb 6.4 oz (90.9 kg)   SpO2 100%   BMI 30.47 kg/m    Filed Weights   09/17/16 1141  Weight: 200 lb 6.4 oz (90.9 kg)   General: Well appearing. No resp difficulty. HEENT: Normal Neck: Supple. JVP 7-8 cm. Carotids 2+ bilat; no bruits. No thyromegaly or nodule noted. Cor: PMI nondisplaced. RRR, No M/G/R noted Lungs: CTAB, normal effort. Abdomen: Soft, non-tender, non-distended, no HSM. No bruits or masses. +BS  Extremities: No cyanosis, clubbing, rash. 3+ R ankle edema, erythematous.  Neuro: Alert & orientedx3, cranial nerves grossly intact. moves all 4 extremities w/o difficulty. Affect pleasant    Assessment/Plan: 1. Chronic diastolic CHF: Echo 02/6071 EF 60-65%.  - NYHA II - Volume status much improved on exam. Continue 60 mg torsemide BID.  - Reinforced dietary restrictions and fluid restrictions.   2. HTN:  - Well controlled on current regimen.    3. CAD s.p CABG - No signs or symptoms of ischemia - Continue ASA, statin and Coreg.      4. COPD: Gold stage III   - Follows with Dr. Melvyn Novas.      5. CKD: Stage V.-  L AVF. Sees Dr Marval Regal.   6. DVT - Diagnosed 08/30/16 - continue warfarin   7. Acute gout flare - uric acid now - prednisone 40 mg daily for 3 days  - start colchicine 0.6 mg bid - After 3 days start allopurinol 100mg  daily.   Leonard Leas NP-C  09/17/2016  Greater than 50% of the (total minutes 30) visit spent in counseling/coordination of care regarding discussing medication changes with PharmD and physician, patient education, medication compliance.    Patient seen and examined with Jettie Booze, NP. We discussed all aspects of the encounter. I agree with the assessment and plan as stated above.   Volume status much improved. Agree with med adjustments as above. Left ankle warm, tender to tough with limited mobility. Suspect acute gout in setting of diuresis. Uric acid 10.9. Will give prednisone 40mg  daily for 3 days will colchicine 0.6 bid. Once flare settles down start allopurinol 100 daily. Check BMET.   Glori Bickers, MD  11:26 PM

## 2016-09-17 NOTE — Progress Notes (Signed)
Advanced Heart Failure Medication Review by a Pharmacist  Does the patient  feel that his/her medications are working for him/her?  yes  Has the patient been experiencing any side effects to the medications prescribed?  no  Does the patient measure his/her own blood pressure or blood glucose at home?  yes   Does the patient have any problems obtaining medications due to transportation or finances?   no  Understanding of regimen: fair Understanding of indications: fair Potential of compliance: good Patient understands to avoid NSAIDs. Patient understands to avoid decongestants.  Issues to address at subsequent visits: None   Pharmacist comments: Mr. Montellano is a pleasant 66 yo M presenting with his wife who manages his medications. He reports good compliance with his regimen and did not have any specific medication-related questions or concerns for me at this time.   Ruta Hinds. Velva Harman, PharmD, BCPS, CPP Clinical Pharmacist Pager: [phone] Phone: [phone] 09/17/2016 11:49 AM      Time with patient: 10 minutes Preparation and documentation time: 2 minutes Total time: 12 minutes

## 2016-09-17 NOTE — Patient Instructions (Addendum)
REDUCE Torsemide to 60 mg (3 tabs) once daily.  Take Colchicine 0.6 mg tablet once daily for three days.  Take Prednisone 40 mg (2 tabs) once daily with breakfast for three days.  Start Allopurinol 100 mg tablet once daily starting on Sunday.  Routine lab work today. Will notify you of abnormal results, otherwise no news is good news!  Follow up 2 weeks.  Take all medication as prescribed the day of your appointment. Bring all medications with you to your appointment.  Do the following things EVERYDAY: 1) Weigh yourself in the morning before breakfast. Write it down and keep it in a log. 2) Take your medicines as prescribed 3) Eat low salt foods-Limit salt (sodium) to 2000 mg per day.  4) Stay as active as you can everyday 5) Limit all fluids for the day to less than 2 liters

## 2016-09-21 ENCOUNTER — Emergency Department (HOSPITAL_COMMUNITY)
Admission: EM | Admit: 2016-09-21 | Discharge: 2016-09-21 | Disposition: A | Discharge status: Home / Self Care | Payer: Medicare Other | Attending: Emergency Medicine | Admitting: Emergency Medicine

## 2016-09-21 ENCOUNTER — Encounter (HOSPITAL_COMMUNITY): Payer: Self-pay | Admitting: *Deleted

## 2016-09-21 ENCOUNTER — Emergency Department (HOSPITAL_COMMUNITY): Payer: Medicare Other

## 2016-09-21 DIAGNOSIS — Z7901 Long term (current) use of anticoagulants: Secondary | ICD-10-CM | POA: Insufficient documentation

## 2016-09-21 DIAGNOSIS — I130 Hypertensive heart and chronic kidney disease with heart failure and stage 1 through stage 4 chronic kidney disease, or unspecified chronic kidney disease: Secondary | ICD-10-CM | POA: Insufficient documentation

## 2016-09-21 DIAGNOSIS — Z951 Presence of aortocoronary bypass graft: Secondary | ICD-10-CM | POA: Insufficient documentation

## 2016-09-21 DIAGNOSIS — M79605 Pain in left leg: Secondary | ICD-10-CM | POA: Diagnosis not present

## 2016-09-21 DIAGNOSIS — M189 Osteoarthritis of first carpometacarpal joint, unspecified: Secondary | ICD-10-CM | POA: Diagnosis not present

## 2016-09-21 DIAGNOSIS — Z86718 Personal history of other venous thrombosis and embolism: Secondary | ICD-10-CM | POA: Diagnosis not present

## 2016-09-21 DIAGNOSIS — R7309 Other abnormal glucose: Secondary | ICD-10-CM | POA: Diagnosis not present

## 2016-09-21 DIAGNOSIS — Z87891 Personal history of nicotine dependence: Secondary | ICD-10-CM | POA: Insufficient documentation

## 2016-09-21 DIAGNOSIS — E7800 Pure hypercholesterolemia, unspecified: Secondary | ICD-10-CM | POA: Diagnosis not present

## 2016-09-21 DIAGNOSIS — M109 Gout, unspecified: Secondary | ICD-10-CM | POA: Diagnosis not present

## 2016-09-21 DIAGNOSIS — M7989 Other specified soft tissue disorders: Secondary | ICD-10-CM | POA: Diagnosis not present

## 2016-09-21 DIAGNOSIS — M1A00X0 Idiopathic chronic gout, unspecified site, without tophus (tophi): Secondary | ICD-10-CM | POA: Diagnosis not present

## 2016-09-21 DIAGNOSIS — I2510 Atherosclerotic heart disease of native coronary artery without angina pectoris: Secondary | ICD-10-CM | POA: Insufficient documentation

## 2016-09-21 DIAGNOSIS — Z79899 Other long term (current) drug therapy: Secondary | ICD-10-CM | POA: Diagnosis not present

## 2016-09-21 DIAGNOSIS — E1165 Type 2 diabetes mellitus with hyperglycemia: Secondary | ICD-10-CM | POA: Diagnosis not present

## 2016-09-21 DIAGNOSIS — E119 Type 2 diabetes mellitus without complications: Secondary | ICD-10-CM | POA: Insufficient documentation

## 2016-09-21 DIAGNOSIS — I5032 Chronic diastolic (congestive) heart failure: Secondary | ICD-10-CM | POA: Diagnosis not present

## 2016-09-21 DIAGNOSIS — N184 Chronic kidney disease, stage 4 (severe): Secondary | ICD-10-CM | POA: Insufficient documentation

## 2016-09-21 DIAGNOSIS — Z794 Long term (current) use of insulin: Secondary | ICD-10-CM | POA: Insufficient documentation

## 2016-09-21 DIAGNOSIS — M12872 Other specific arthropathies, not elsewhere classified, left ankle and foot: Secondary | ICD-10-CM | POA: Insufficient documentation

## 2016-09-21 DIAGNOSIS — R69 Illness, unspecified: Secondary | ICD-10-CM

## 2016-09-21 DIAGNOSIS — M119 Crystal arthropathy, unspecified: Secondary | ICD-10-CM

## 2016-09-21 DIAGNOSIS — M1180 Other specified crystal arthropathies, unspecified site: Secondary | ICD-10-CM | POA: Diagnosis not present

## 2016-09-21 DIAGNOSIS — M79672 Pain in left foot: Secondary | ICD-10-CM | POA: Diagnosis not present

## 2016-09-21 DIAGNOSIS — M1A0720 Idiopathic chronic gout, left ankle and foot, without tophus (tophi): Secondary | ICD-10-CM

## 2016-09-21 LAB — CBC
HEMATOCRIT: 27.9 % — AB (ref 39.0–52.0)
HEMOGLOBIN: 8.7 g/dL — AB (ref 13.0–17.0)
MCH: 22.1 pg — AB (ref 26.0–34.0)
MCHC: 31.2 g/dL (ref 30.0–36.0)
MCV: 71 fL — AB (ref 78.0–100.0)
Platelets: 310 10*3/uL (ref 150–400)
RBC: 3.93 MIL/uL — AB (ref 4.22–5.81)
RDW: 19.3 % — ABNORMAL HIGH (ref 11.5–15.5)
WBC: 9.3 10*3/uL (ref 4.0–10.5)

## 2016-09-21 LAB — I-STAT CHEM 8, ED
BUN: 58 mg/dL — AB (ref 6–20)
CALCIUM ION: 1.02 mmol/L — AB (ref 1.15–1.40)
CHLORIDE: 103 mmol/L (ref 101–111)
Creatinine, Ser: 2.6 mg/dL — ABNORMAL HIGH (ref 0.61–1.24)
Glucose, Bld: 423 mg/dL — ABNORMAL HIGH (ref 65–99)
HEMATOCRIT: 28 % — AB (ref 39.0–52.0)
Hemoglobin: 9.5 g/dL — ABNORMAL LOW (ref 13.0–17.0)
Potassium: 4.2 mmol/L (ref 3.5–5.1)
SODIUM: 136 mmol/L (ref 135–145)
TCO2: 21 mmol/L (ref 0–100)

## 2016-09-21 MED ORDER — HYDROCODONE-ACETAMINOPHEN 5-325 MG PO TABS: 1.0000 | ORAL_TABLET | ORAL | 0 refills | Status: DC | PRN

## 2016-09-21 MED ORDER — CARVEDILOL 12.5 MG PO TABS
25.0000 mg | ORAL_TABLET | Freq: Two times a day (BID) | ORAL | Status: DC
  Administered 2016-09-21: 25 mg via ORAL
  Filled 2016-09-21: qty 2

## 2016-09-21 MED ORDER — AMLODIPINE BESYLATE 5 MG PO TABS
10.0000 mg | ORAL_TABLET | Freq: Once | ORAL | Status: AC
  Administered 2016-09-21: 10 mg via ORAL
  Filled 2016-09-21: qty 2

## 2016-09-21 MED ORDER — HYDRALAZINE HCL 25 MG PO TABS: 100.0000 mg | ORAL_TABLET | Freq: Once | ORAL | Status: DC

## 2016-09-21 MED ORDER — OXYCODONE-ACETAMINOPHEN 5-325 MG PO TABS: 2.0000 | ORAL_TABLET | ORAL | 0 refills | Status: DC | PRN

## 2016-09-21 MED ORDER — INSULIN ASPART 100 UNIT/ML ~~LOC~~ SOLN
15.0000 [IU] | Freq: Once | SUBCUTANEOUS | Status: AC
  Administered 2016-09-21: 15 [IU] via SUBCUTANEOUS
  Filled 2016-09-21: qty 1

## 2016-09-21 MED ORDER — HYDROCODONE-ACETAMINOPHEN 5-325 MG PO TABS
2.0000 | ORAL_TABLET | Freq: Once | ORAL | Status: AC
  Administered 2016-09-21: 2 via ORAL
  Filled 2016-09-21: qty 2

## 2016-09-21 NOTE — ED Notes (Signed)
CBG- [id number]

## 2016-09-21 NOTE — ED Notes (Signed)
Pt friend driving patient home.

## 2016-09-21 NOTE — Discharge Instructions (Signed)
1. Call Dr. Lorin Mercy (orthopedic doctor) tomorrow to schedule a recheck this week. 2. Take Percocet as needed for pain. 3. Try to elevate your foot as much as possible. 4. Take your Lantus when you get home. Continue to monitor your blood sugars and take all of your insulin as directed. Due to your diabetes and poorly controlled blood sugar, you cannot take additional prednisone at this time. If your diabetes control is improved, you may be able to take additional steroids for inflammation and pain control.

## 2016-09-21 NOTE — ED Provider Notes (Signed)
[facility] DEPT Provider Note   CSN: [id number] Arrival date & time: 09/21/16  1000     History   Chief Complaint Chief Complaint  Patient presents with  . Foot Pain    HPI Leonard [name] is a 66 y.o. male.  HPI Patient reports he has had swelling of his left foot and pain for about 3 months. He reports that he was not seeking treatment for initially. He has more recently had ultrasound of the lower extremity showing chronic DVT. Patient is taking Coumadin. He notes that last week he was seen at his cardiologist's office. There is foot swelling was suspected to be secondary to gout. Patient was given prednisone for 3 days and colchicine to start. He reports he has not gotten improvement. He would not say it is worse, but he has not seen any improvement. No fever, no chills, no myalgia. Past Medical History:  Diagnosis Date  . Cancer ([place])    skin cancer-  . CHF (congestive heart failure) ([place])   . Chronic kidney disease    not on dialysis  . Coronary artery disease   . Gangrene of toe ([place])    right 5th/notes 01/04/2013   . High cholesterol   . Hypertension   . Iron deficiency anemia 08/03/2015  . 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 PAD (peripheral artery disease) ([place])   . Shortness of breath dyspnea    with exertion  . Type II diabetes mellitus ([place])    Type II    Patient Active Problem List   Diagnosis Date Noted  . Long term (current) use of anticoagulants [Z79.01] 09/09/2016  . Chronic deep vein thrombosis (DVT) of femoral vein of left lower extremity ([place]) 09/03/2016  . Pleural effusion   . Oxygen desaturation   . Morbid obesity due to excess calories ([place]) 03/17/2016  . Anemia due to stage 4 chronic kidney disease treated with darbepoetin ([place]) 02/28/2016  . Fatigue 01/21/2016  . Diabetic retinopathy ([place]) 11/13/2015  . Dyspnea 08/28/2015  . CHF (congestive heart failure) ([place]) 08/27/2015  . Iron deficiency anemia  08/03/2015  . Claudication ([place])   . Type 2 diabetes mellitus with stage 3 chronic kidney disease, with long-term current use of insulin ([name])   . Secondary hypertension   . Hematuria 06/27/2015  . Skin lesion of face 02/22/2015  . 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 Deficiency anemia 01/15/2015  . CKD (chronic kidney disease) stage 4, GFR 15-29 ml/min ([facility]) 01/15/2015  . Hypothyroidism 11/22/2014  . Acute on chronic diastolic congestive heart failure ([place]) 07/04/2014  . COPD GOLD III  02/16/2014  . History of tobacco use 08/23/2013  . 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 CAD (coronary artery disease) 04/06/2013  . PVD - hx of Rt SFA PTA and s/p Rt 4-5th toe amp Dec 2014 04/05/2013  . Essential hypertension 11/25/2012  . Mixed hyperlipidemia 07/17/2008    Past Surgical History:  Procedure Laterality Date  . AMPUTATION Right 01/28/2013   Procedure: RAY AMPUTATION RIGHT  4th & 5th TOE;  Surgeon: Rosetta Posner, MD;  Location: [facility] OR;  Service: Vascular;  Laterality: Right;  . ANGIOPLASTY / STENTING FEMORAL Right 01/13/2013   superficial femoral artery x 2 (3 mm x 60 mm, 4 mm x 60 mm)  . AV FISTULA PLACEMENT Left 02/11/2016   Procedure: LEFT UPPER ARM BRACHIAL CEPHALIC ARTERIOVENOUS (AV) FISTULA CREATION;  Surgeon: Conrad Puxico, MD;  Location: [facility];  Service: Vascular;  Laterality: Left;  . COLONOSCOPY W/ POLYPECTOMY    . CORONARY ARTERY BYPASS GRAFT N/A 04/07/2013   Procedure: CORONARY ARTERY BYPASS GRAFTING (CABG);  Surgeon: Ivin Poot, MD;  Location: [facility];  Service: Open Heart Surgery;  Laterality: N/A;  . INTRAOPERATIVE TRANSESOPHAGEAL ECHOCARDIOGRAM N/A 04/07/2013   Procedure: INTRAOPERATIVE TRANSESOPHAGEAL ECHOCARDIOGRAM;  Surgeon: Ivin Poot, MD;  Location: [facility];  Service: Open Heart Surgery;  Laterality: N/A;  . LEFT HEART CATHETERIZATION WITH CORONARY ANGIOGRAM N/A 04/05/2013   Procedure: LEFT HEART CATHETERIZATION WITH CORONARY ANGIOGRAM;  Surgeon:  Peter M Martinique, MD;  Location: [facility] CATH LAB;  Service: Cardiovascular;  Laterality: N/A;  . LOWER EXTREMITY ANGIOGRAM Bilateral 01/06/2013   Procedure: LOWER EXTREMITY ANGIOGRAM;  Surgeon: Conrad [place], MD;  Location: [facility] CATH LAB;  Service: Cardiovascular;  Laterality: Bilateral;  . LOWER EXTREMITY ANGIOGRAM Right 01/13/2013   Procedure: LOWER EXTREMITY ANGIOGRAM;  Surgeon: Conrad Elvaston, MD;  Location: [facility] CATH LAB;  Service: Cardiovascular;  Laterality: Right;  . SKIN CANCER EXCISION  2017  . THROAT SURGERY  1983   polyps  . TONSILLECTOMY AND ADENOIDECTOMY  ~ 1965       Home Medications    Prior to Admission medications   Medication Sig Start Date End Date Taking? Authorizing Provider  allopurinol (ZYLOPRIM) 100 MG tablet Take 1 tablet (100 mg total) by mouth daily. 09/21/16  Yes Arbutus Leas, NP  amLODipine (NORVASC) 10 MG tablet Take 1 tablet (10 mg total) by mouth daily. 02/19/16  Yes Clegg, Amy D, NP  aspirin 81 MG tablet TAKE 81 MG BY MOUTH EVERY DAY Patient taking differently: Take 81 mg by mouth daily.  02/11/16  Yes Virgina Jock A, PA-C  atorvastatin (LIPITOR) 40 MG tablet Take 40 mg by mouth daily.   Yes [provider]  carvedilol (COREG) 25 MG tablet Take 2 tablets (50 mg total) by mouth 2 (two) times daily with a meal. Patient taking differently: Take 25 mg by mouth 2 (two) times daily with a meal.  10/23/15  Yes Tillery, Satira Mccallum, PA-C  cloNIDine (CATAPRES) 0.2 MG tablet Take 2 tablets (0.4 mg total) by mouth 2 (two) times daily. 06/10/16  Yes Clegg, Amy D, NP  colchicine 0.6 MG tablet Take 1 tablet (0.6 mg total) by mouth daily. 09/17/16  Yes Arbutus Leas, NP  doxazosin (CARDURA) 8 MG tablet Take 1 tablet (8 mg total) by mouth 2 (two) times daily. 01/07/16  Yes Larey Dresser, MD  ferrous sulfate (FERROUSUL) 325 (65 FE) MG tablet Take 1 tablet (325 mg total) by mouth 2 (two) times daily with a meal. 05/20/16  Yes Eloise Levels, MD  hydrALAZINE (APRESOLINE) 100  MG tablet Take 100 mg by mouth every 8 (eight) hours.   Yes [provider]  insulin aspart (NOVOLOG FLEXPEN) 100 UNIT/ML FlexPen Inject 15 Units into the skin 2 (two) times daily.    Yes [provider]  Insulin Glargine (LANTUS SOLOSTAR) 100 UNIT/ML Solostar Pen Inject 40 Units into the skin every morning.   Yes [provider]  isosorbide mononitrate (IMDUR) 60 MG 24 hr tablet TAKE 1 TABLET BY MOUTH DAILY. Patient taking differently: TAKE 60mg  BY MOUTH once DAILY. 08/26/16  Yes Shirley Friar, PA-C  levothyroxine (SYNTHROID, LEVOTHROID) 50 MCG tablet TAKE ONE (1) TABLET BY MOUTH EVERY DAY BEFORE BREAKFAST Patient taking differently: TAKE 76mcg BY MOUTH EVERY DAY BEFORE BREAKFAST 08/12/16  Yes [name], MD  pantoprazole (PROTONIX) 40 MG tablet  TAKE ONE (1) TABLET BY MOUTH EVERY DAY Patient taking differently: TAKE 40mg  TABLET BY MOUTH EVERY DAY 05/27/16  Yes Gambino, Arlie Solomons, MD  potassium chloride SA (K-DUR,KLOR-CON) 20 MEQ tablet Take 20 mEq by mouth daily.   Yes [provider]  predniSONE (DELTASONE) 20 MG tablet Take 2 tablets (40 mg total) by mouth daily with breakfast. 09/17/16  Yes Arbutus Leas, NP  torsemide (DEMADEX) 20 MG tablet Take 3 tablets (60 mg total) by mouth daily. May take an extra 20 mg Daily for weight gain above 212 lbs 09/17/16  Yes Jettie Booze E, NP  warfarin (COUMADIN) 5 MG tablet Take 2.5-5 mg by mouth See admin instructions. Take 2.5 mg (1/2 tablet) daily except 5 mg (1 tablet) on Monday and Friday    Yes [provider]  nitroGLYCERIN (NITROSTAT) 0.4 MG SL tablet Place 1 tablet (0.4 mg total) under the tongue every 5 (five) minutes as needed for chest pain. 06/06/15   Mercy Riding, MD  oxyCODONE-acetaminophen (PERCOCET) 5-325 MG tablet Take 2 tablets by mouth every 4 (four) hours as needed. 09/21/16   Charlesetta Shanks, MD    Family History Family History  Problem Relation Age of Onset  . Cancer Mother          lung cancer  . Hypertension Mother   . Cancer Sister        patient thinks it was uterine cancer  . Hypertension Sister   . Heart attack Sister   . Stroke Neg Hx     Social History Social History  Substance Use Topics  . Smoking status: Former Smoker    Packs/day: 1.00    Years: 46.00    Types: Cigarettes    Start date: 02/04/1967    Quit date: 02/03/2013  . Smokeless tobacco: Never Used     Comment: Doing well with quitting.  . Alcohol use No     Allergies   No known allergies   Review of Systems Review of Systems 10 Systems reviewed and are negative for acute change except as noted in the HPI.   Physical Exam Updated Vital Signs BP (!) 168/60   Pulse 71   Temp 98 F (36.7 C) (Oral)   Resp 16   Wt 93.9 kg (207 lb)   SpO2 98%   BMI 31.47 kg/m   Physical Exam  Constitutional: He appears well-developed and well-nourished. No distress.  HENT:  Head: Normocephalic and atraumatic.  Eyes: EOM are normal.  Cardiovascular: Normal rate, regular rhythm, normal heart sounds and intact distal pulses.   Pulmonary/Chest: Effort normal and breath sounds normal.  Abdominal: Soft. He exhibits no distension. There is no tenderness. There is no guarding.  Musculoskeletal:  Patient has fairly diffuse swelling of the left foot. Tenderness localizes over the metatarsals and tarsals particularly at the medial central arch. He does have diffuse lower extremity edema on the right. He denies any tenderness to compression of the calf and lower leg. He does endorse tenderness to palpation over the ankle. There is mild effusion at the ankle. Dorsalis pedis pulses 2+. Patient has mild to deep pressure of the sole of the foot. He appears to have 1 plantar wart on the sole of the foot which has no associated focal drainage, swelling or redness. This is hard, callused. Right foot has old amputation of the fourth and the fifth digits. This is well healed. Right foot does not have significant  swelling or redness. No peripheral edema of the right  lower leg.      Right lower extremity. Old well-healed amputation site. This foot is nontender.  Some tenderness to palpation of the sole of the forefoot but not dramatic. No moist lesions or maceration between the toes.  Pitting over the dorsum of the foot. Significant tenderness over the metatarsal tarsal junction.     ED Treatments / Results  Labs (all labs ordered are listed, but only abnormal results are displayed) Labs Reviewed  CBC - Abnormal; Notable for the following:       Result Value   RBC 3.93 (*)    Hemoglobin 8.7 (*)    HCT 27.9 (*)    MCV 71.0 (*)    MCH 22.1 (*)    RDW 19.3 (*)    All other components within normal limits  I-STAT CHEM 8, ED - Abnormal; Notable for the following:    BUN 58 (*)    Creatinine, Ser 2.60 (*)    Glucose, Bld 423 (*)    Calcium, Ion 1.02 (*)    Hemoglobin 9.5 (*)    HCT 28.0 (*)    All other components within normal limits    EKG  EKG Interpretation None       Radiology Mr Foot Left Wo Contrast  Result Date: 09/21/2016 CLINICAL DATA:  Left foot pain.  History of gout. EXAM: MRI OF THE LEFT FOOT WITHOUT CONTRAST TECHNIQUE: Multiplanar, multisequence MR imaging of the left foot was performed. No intravenous contrast was administered. COMPARISON:  None. FINDINGS: Bones/Joint/Cartilage Severe joint space narrowing of the first tarsometatarsal joint with severe marrow edema on either side of the joint and articular surface irregularity. No significant joint effusion. Marrow edema on either side of the navicular- medial cuneiform joint. Marrow edema on either side of the talonavicular joint. Marrow edema in the anterior process of the calcaneus. Marrow edema throughout the second metatarsal. Mild marrow edema in the fifth metatarsal. No definite bone destruction. Small ankle joint effusion. No fracture or dislocation. Normal alignment. Ligaments Collateral ligaments are intact.  Muscles and Tendons Flexor, peroneal and extensor compartment tendons are intact. T2 hyperintense signal throughout the plantar musculature likely neurogenic given the patient's history of diabetes. Soft tissue No fluid collection or hematoma. No soft tissue mass. Fluid in the peroneal tendon sheath consistent with tenosynovitis. IMPRESSION: 1. Severe joint space narrowing of the first tarsometatarsal joint with severe marrow edema on either side of the joint and articular surface irregularity. No significant joint effusion. This appearance can be seen with a crystalline arthropathy such as gout versus less likely osteomyelitis. 2. Marrow edema on either side of the navicular- medial cuneiform joint. Marrow edema on either side of the talonavicular joint. Marrow edema in the anterior process of the calcaneus. Marrow edema throughout the second metatarsal. Mild marrow edema in the fifth metatarsal. No definite bone destruction. 3. Given the multifocal marrow edema these findings can be seen with a crystalline arthropathy, although multifocal osteomyelitis can have a similar appearance. Electronically Signed   By: Kathreen Devoid   On: 09/21/2016 17:33   Dg Foot Complete Left  Result Date: 09/21/2016 CLINICAL DATA:  Left foot pain and swelling. EXAM: LEFT FOOT - COMPLETE 3+ VIEW COMPARISON:  None. FINDINGS: Mottled lucency of the proximal first metatarsal with unexpected proximal fracture type lucency. No visible neighboring ulcer. No dislocation. Atherosclerotic calcification. Osteopenia and arterial calcification. Dorsal mid forefoot soft tissue swelling. IMPRESSION: Abnormal proximal first metatarsal which may reflect a subacute nondisplaced fracture. Affected bone is mottled and  acute osteomyelitis is also considered, please correlate for infectious symptoms or regional ulcer. Electronically Signed   By: Monte Fantasia M.D.   On: 09/21/2016 10:58    Procedures Procedures (including critical care  time)  Medications Ordered in ED Medications  carvedilol (COREG) tablet 25 mg (25 mg Oral Given 09/21/16 1712)  hydrALAZINE (APRESOLINE) tablet 100 mg (100 mg Oral Not Given 09/21/16 1717)  amLODipine (NORVASC) tablet 10 mg (10 mg Oral Given 09/21/16 1712)  insulin aspart (novoLOG) injection 15 Units (15 Units Subcutaneous Given 09/21/16 1713)  HYDROcodone-acetaminophen (NORCO/VICODIN) 5-325 MG per tablet 2 tablet (2 tablets Oral Given 09/21/16 1752)     Initial Impression / Assessment and Plan / ED Course  I have reviewed the triage vital signs and the nursing notes.  Pertinent labs & imaging results that were available during my care of the patient were reviewed by me and considered in my medical decision making (see chart for details).      Patient did not take any of his medications today.  Consult: Dr. Lorin Mercy on-call for orthopedics. Dr. Lorin Mercy was scrubbed in the surgery. In case presented via [place]. Will proceed with MRI.   Final Clinical Impressions(s) / ED Diagnoses   Final diagnoses:  Crystal arthropathy of ankle and foot  Severe comorbid illness  Chronic gout of left foot, unspecified cause  Patient describes months of swelling and pedal pain. At the end of July, ultrasound was done for left lower extremity swelling. This identified chronic DVT. Patient is chronically anticoagulated on Coumadin. He was then reassessed at his cardiologist's office last week. He was given a course of colchicine and prednisone for 3 day duration. At that time uric acid was assessed and found to be elevated. Patient had prior history of gout. She presents today citing no significant improvement with therapy. X-ray showed bony deterioration per radiology possible older fracture or osteomyelitis. I did proceed with MRI shows sites of anomaly consistent with crystalline arthropathy and less likely osteomyelitis per radiology review. Based on patient's history of long-standing swelling, no leukocytosis,  no constitutional symptoms or fever and recent elevated uric acid with history of gout, I feel this most likely represents gout. He has already been treated with colchicine and prednisone with limited, to no improvement. Unfortunately, with the patient's diabetes and hypertension he has limited ability to continue prednisone and colchicine. Patient's blood sugar is elevated today but he did not take his insulin today either. He shows no signs of HHNS, sepsis or Sirs. At this time, with chronicity of symptoms and comorbid illness, will opt to treat for pain with Percocet and advised the patient for close follow-up with orthopedics. New Prescriptions New Prescriptions   OXYCODONE-ACETAMINOPHEN (PERCOCET) 5-325 MG TABLET    Take 2 tablets by mouth every 4 (four) hours as needed.     Charlesetta Shanks, MD 09/21/16 [phone]

## 2016-09-21 NOTE — ED Triage Notes (Addendum)
Pt here by ems for left foot pain.  Some swelling noted in left foot.  Not associated with any rauma/injury.  Pt reports that he was seen for this and dx with gout and placed on allopurinol, colchicine and prednisone which he has been taking without relief. CBG was 400 for ems

## 2016-09-21 NOTE — ED Notes (Signed)
Pt in MRI.

## 2016-09-21 NOTE — ED Notes (Signed)
ED Provider at bedside. 

## 2016-09-22 LAB — CBG MONITORING, ED: GLUCOSE-CAPILLARY: 380 mg/dL — AB (ref 65–99)

## 2016-09-23 ENCOUNTER — Other Ambulatory Visit (HOSPITAL_COMMUNITY): Payer: Self-pay

## 2016-09-23 ENCOUNTER — Ambulatory Visit (INDEPENDENT_AMBULATORY_CARE_PROVIDER_SITE_OTHER): Payer: Medicare Other | Admitting: *Deleted

## 2016-09-23 DIAGNOSIS — Z7901 Long term (current) use of anticoagulants: Secondary | ICD-10-CM

## 2016-09-23 DIAGNOSIS — I779 Disorder of arteries and arterioles, unspecified: Secondary | ICD-10-CM

## 2016-09-23 DIAGNOSIS — I82512 Chronic embolism and thrombosis of left femoral vein: Secondary | ICD-10-CM | POA: Diagnosis not present

## 2016-09-23 LAB — POCT INR: INR: 2.4

## 2016-09-23 NOTE — Progress Notes (Signed)
Paramedicine Encounter    Patient ID: Leonard [name], male    DOB: April 20, 1950, 66 y.o.   MRN: [id number]    Patient Care Team: [name], MD as PCP - General (Family Medicine) [name], MD as Consulting Physician (Nephrology)  Patient Active Problem List   Diagnosis Date Noted  . Long term (current) use of anticoagulants [Z79.01] 09/09/2016  . Chronic deep vein thrombosis (DVT) of femoral vein of left lower extremity ([place]) 09/03/2016  . Pleural effusion   . Oxygen desaturation   . Morbid obesity due to excess calories ([place]) 03/17/2016  . Anemia due to stage 4 chronic kidney disease treated with darbepoetin ([place]) 02/28/2016  . Fatigue 01/21/2016  . Diabetic retinopathy ([place]) 11/13/2015  . Dyspnea 08/28/2015  . CHF (congestive heart failure) ([place]) 08/27/2015  . Iron deficiency anemia 08/03/2015  . Claudication ([place])   . Type 2 diabetes mellitus with stage 3 chronic kidney disease, with long-term current use of insulin ([place])   . Secondary hypertension   . Hematuria 06/27/2015  . Skin lesion of face 02/22/2015  . 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 Deficiency anemia 01/15/2015  . CKD (chronic kidney disease) stage 4, GFR 15-29 ml/min (HCC) 01/15/2015  . Hypothyroidism 11/22/2014  . Acute on chronic diastolic congestive heart failure ([place]) 07/04/2014  . COPD GOLD III  02/16/2014  . History of tobacco use 08/23/2013  . 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 CAD (coronary artery disease) 04/06/2013  . PVD - hx of Rt SFA PTA and s/p Rt 4-5th toe amp Dec 2014 04/05/2013  . Essential hypertension 11/25/2012  . Mixed hyperlipidemia 07/17/2008    Current Outpatient Prescriptions:  .  allopurinol (ZYLOPRIM) 100 MG tablet, Take 1 tablet (100 mg total) by mouth daily., Disp: 30 tablet, Rfl: 6 .  amLODipine (NORVASC) 10 MG tablet, Take 1 tablet (10 mg total) by mouth daily., Disp: 30 tablet, Rfl: 6 .  aspirin 81 MG tablet, TAKE 81 MG BY MOUTH  EVERY DAY (Patient taking differently: Take 81 mg by mouth daily. ), Disp: 30 tablet, Rfl: 3 .  atorvastatin (LIPITOR) 40 MG tablet, Take 40 mg by mouth daily., Disp: , Rfl:  .  carvedilol (COREG) 25 MG tablet, Take 2 tablets (50 mg total) by mouth 2 (two) times daily with a meal. (Patient taking differently: Take 25 mg by mouth 2 (two) times daily with a meal. ), Disp: 120 tablet, Rfl: 3 .  cloNIDine (CATAPRES) 0.2 MG tablet, Take 2 tablets (0.4 mg total) by mouth 2 (two) times daily., Disp: 120 tablet, Rfl: 3 .  doxazosin (CARDURA) 8 MG tablet, Take 1 tablet (8 mg total) by mouth 2 (two) times daily., Disp: 60 tablet, Rfl: 6 .  ferrous sulfate (FERROUSUL) 325 (65 FE) MG tablet, Take 1 tablet (325 mg total) by mouth 2 (two) times daily with a meal., Disp: 60 tablet, Rfl: 3 .  hydrALAZINE (APRESOLINE) 100 MG tablet, Take 100 mg by mouth every 8 (eight) hours., Disp: , Rfl:  .  Insulin Glargine (LANTUS SOLOSTAR) 100 UNIT/ML Solostar Pen, Inject 40 Units into the skin every morning., Disp: , Rfl:  .  isosorbide mononitrate (IMDUR) 60 MG 24 hr tablet, TAKE 1 TABLET BY MOUTH DAILY. (Patient taking differently: TAKE 60mg  BY MOUTH once DAILY.), Disp: 30 tablet, Rfl: 3 .  levothyroxine (SYNTHROID, LEVOTHROID) 50 MCG tablet, TAKE ONE (1) TABLET BY MOUTH EVERY DAY BEFORE BREAKFAST (Patient taking differently: TAKE 40mcg BY MOUTH EVERY DAY BEFORE BREAKFAST),  Disp: 30 tablet, Rfl: 3 .  oxyCODONE-acetaminophen (PERCOCET) 5-325 MG tablet, Take 2 tablets by mouth every 4 (four) hours as needed., Disp: 20 tablet, Rfl: 0 .  pantoprazole (PROTONIX) 40 MG tablet, TAKE ONE (1) TABLET BY MOUTH EVERY DAY (Patient taking differently: TAKE 40mg  TABLET BY MOUTH EVERY DAY), Disp: 30 tablet, Rfl: 3 .  potassium chloride SA (K-DUR,KLOR-CON) 20 MEQ tablet, Take 20 mEq by mouth daily., Disp: , Rfl:  .  torsemide (DEMADEX) 20 MG tablet, Take 3 tablets (60 mg total) by mouth daily. May take an extra 20 mg Daily for weight gain above  212 lbs, Disp: 90 tablet, Rfl: 6 .  insulin aspart (NOVOLOG FLEXPEN) 100 UNIT/ML FlexPen, Inject 15 Units into the skin 2 (two) times daily. , Disp: , Rfl:  .  nitroGLYCERIN (NITROSTAT) 0.4 MG SL tablet, Place 1 tablet (0.4 mg total) under the tongue every 5 (five) minutes as needed for chest pain., Disp: 30 tablet, Rfl: 12 .  warfarin (COUMADIN) 5 MG tablet, Take 2.5-5 mg by mouth See admin instructions. Take 2.5 mg (1/2 tablet) daily except 5 mg (1 tablet) on Monday and Friday , Disp: , Rfl:  Allergies  Allergen Reactions  . No Known Allergies      Social History   Social History  . Marital status: Single    Spouse name: N/A  . Number of children: N/A  . Years of education: N/A   Occupational History  . Not on file.   Social History Main Topics  . Smoking status: Former Smoker    Packs/day: 1.00    Years: 46.00    Types: Cigarettes    Start date: 02/04/1967    Quit date: 02/03/2013  . Smokeless tobacco: Never Used     Comment: Doing well with quitting.  . Alcohol use No  . Drug use: No  . Sexual activity: Not Currently   Other Topics Concern  . Not on file   Social History Narrative   Lives with sister, [name] in [place].  Retired - former Sports coach.    Physical Exam  Pulmonary/Chest: He has wheezes.  Slight expiratory wheezing lower lobes/advised tamika of the same.  Abdominal: He exhibits no distension. There is no tenderness. There is no guarding.  Musculoskeletal: He exhibits edema.  Left leg  Skin: Skin is warm and dry. He is not diaphoretic.        Future Appointments Date Time Provider [place]  09/25/2016 2:45 PM [facility] LAB 5 [facility] None  09/25/2016 3:15 PM [facility] FLUSH NURSE [facility] None  10/01/2016 10:00 AM [facility] PA/NP [facility] None  10/01/2016 11:30 AM [facility]  10/09/2016 2:45 PM [facility] LAB 5 [facility] None  10/09/2016 3:15 PM [facility] INJ NURSE [facility] None   10/10/2016 10:30 AM [name], MD [facility] [place]  10/23/2016 2:45 PM [facility] LAB 2 [facility] None  10/23/2016 3:15 PM [facility] INJ NURSE [facility] None  10/29/2016 10:00 AM Gardiner Barefoot, DPM [facility]  11/06/2016 2:45 PM [facility] LAB 5 [facility] None  11/06/2016 3:15 PM [facility] INJ NURSE [facility] None  11/20/2016 2:45 PM [facility] LAB 6 [facility] None  11/20/2016 3:15 PM Gorsuch, Ni, MD [facility] None  11/20/2016 3:45 PM [facility] INJ NURSE [facility] None  12/19/2016 2:00 PM [facility] VASC 3 [facility]  12/19/2016 2:45 PM Nickel, Sharmon Leyden, NP [facility]    ATF pt CAO x4 sitting in his recliner eating grits and eggs/orange juice to drink.  Pt c/o left leg  pain.  He went to the ED over the leg pain.  He last took pain meds last pm before he went to bed.  Pt went to get lab work done today and the warfarin dose did not change. Pt was taken off of the 2nd dose of torsemide.  He took his morning meds beside the insulin.  Upon our assessment pt's CBG read "HI". He then took 40 units of insulin while I re-filled his pill box.  Pt had skipped a all of his afternoon meds this week. rx bottles verified and pill box re-filled.   I spoke with Tamika from Dr. Leavy Cella family medication.  She made him an appointment for Sept 7 @1030  to f/u diabetes. She stated that she will call me back with f/u instructions for today.    BP 136/68 (BP Location: Right Arm, Patient Position: Sitting, Cuff Size: Normal)   Pulse 84   Resp 16   SpO2 98%  cbg +600 HI (took 40untits @ 1122)           563 @ 1138  **warfarin 5mg  MON,FRI, 2.5 sun, tue, wed, thu, sat  Weight yesterday-cant remember Last visit weight-207    Bresha Hosack, EMT Paramedic 09/23/2016    ACTION: Home visit completed

## 2016-09-23 NOTE — Telephone Encounter (Signed)
Jess Barters, EMT called to report that patient's blood sugar was reading high (above 600). Patient was given his 40 units of Lantus at 11:22 AM. Blood sugar was rechecked and decrease to 563.  Patient is not taking his insulin at night. She also reported slight wheezing RLL on expiratory.  Patient is not any distress at this time.  A breathing treatment was not giving because patient does not have anything ordered. Appt scheduled for DM follow up 9-7/18 at 10:30 AM.  Please give her a call at [phone].  Derl Barrow, RN

## 2016-09-25 ENCOUNTER — Other Ambulatory Visit (HOSPITAL_BASED_OUTPATIENT_CLINIC_OR_DEPARTMENT_OTHER): Payer: Medicare Other

## 2016-09-25 ENCOUNTER — Other Ambulatory Visit: Payer: Self-pay | Admitting: Hematology and Oncology

## 2016-09-25 ENCOUNTER — Ambulatory Visit (HOSPITAL_BASED_OUTPATIENT_CLINIC_OR_DEPARTMENT_OTHER): Payer: Medicare Other

## 2016-09-25 VITALS — BP 94/48 | HR 66 | Temp 98.1°F | Resp 18

## 2016-09-25 DIAGNOSIS — N184 Chronic kidney disease, stage 4 (severe): Secondary | ICD-10-CM | POA: Diagnosis present

## 2016-09-25 DIAGNOSIS — I5033 Acute on chronic diastolic (congestive) heart failure: Secondary | ICD-10-CM

## 2016-09-25 DIAGNOSIS — D631 Anemia in chronic kidney disease: Secondary | ICD-10-CM | POA: Diagnosis not present

## 2016-09-25 DIAGNOSIS — N189 Chronic kidney disease, unspecified: Principal | ICD-10-CM

## 2016-09-25 LAB — CBC WITH DIFFERENTIAL/PLATELET
BASO%: 0.4 % (ref 0.0–2.0)
Basophils Absolute: 0 10*3/uL (ref 0.0–0.1)
EOS%: 1.7 % (ref 0.0–7.0)
Eosinophils Absolute: 0.2 10*3/uL (ref 0.0–0.5)
HCT: 25.7 % — ABNORMAL LOW (ref 38.4–49.9)
HGB: 8 g/dL — ABNORMAL LOW (ref 13.0–17.1)
LYMPH%: 8.9 % — AB (ref 14.0–49.0)
MCH: 22.2 pg — ABNORMAL LOW (ref 27.2–33.4)
MCHC: 31.3 g/dL — AB (ref 32.0–36.0)
MCV: 70.9 fL — ABNORMAL LOW (ref 79.3–98.0)
MONO#: 0.9 10*3/uL (ref 0.1–0.9)
MONO%: 9 % (ref 0.0–14.0)
NEUT#: 7.9 10*3/uL — ABNORMAL HIGH (ref 1.5–6.5)
NEUT%: 80 % — AB (ref 39.0–75.0)
PLATELETS: 244 10*3/uL (ref 140–400)
RBC: 3.62 10*6/uL — AB (ref 4.20–5.82)
RDW: 22.1 % — ABNORMAL HIGH (ref 11.0–14.6)
WBC: 9.9 10*3/uL (ref 4.0–10.3)
lymph#: 0.9 10*3/uL (ref 0.9–3.3)

## 2016-09-25 LAB — PREPARE RBC (CROSSMATCH)

## 2016-09-25 MED ORDER — DARBEPOETIN ALFA 500 MCG/ML IJ SOSY
500.0000 ug | PREFILLED_SYRINGE | Freq: Once | INTRAMUSCULAR | Status: AC
  Administered 2016-09-25: 500 ug via SUBCUTANEOUS
  Filled 2016-09-25: qty 1

## 2016-09-25 NOTE — Telephone Encounter (Signed)
[facility] team please call patient and have him schedule an appt with [place] tomorrow if possible or Monday at the very latest to discuss diabetes and to check his breathing . Thank you.

## 2016-09-26 ENCOUNTER — Ambulatory Visit (HOSPITAL_BASED_OUTPATIENT_CLINIC_OR_DEPARTMENT_OTHER): Payer: Medicare Other

## 2016-09-26 DIAGNOSIS — I5033 Acute on chronic diastolic (congestive) heart failure: Secondary | ICD-10-CM | POA: Diagnosis not present

## 2016-09-26 DIAGNOSIS — N184 Chronic kidney disease, stage 4 (severe): Secondary | ICD-10-CM | POA: Diagnosis present

## 2016-09-26 DIAGNOSIS — D631 Anemia in chronic kidney disease: Secondary | ICD-10-CM | POA: Diagnosis not present

## 2016-09-26 MED ORDER — DIPHENHYDRAMINE HCL 25 MG PO CAPS
ORAL_CAPSULE | ORAL | Status: AC
  Filled 2016-09-26: qty 2

## 2016-09-26 MED ORDER — ACETAMINOPHEN 325 MG PO TABS
ORAL_TABLET | ORAL | Status: AC
  Filled 2016-09-26: qty 2

## 2016-09-26 MED ORDER — ACETAMINOPHEN 325 MG PO TABS
650.0000 mg | ORAL_TABLET | Freq: Once | ORAL | Status: AC
  Administered 2016-09-26: 650 mg via ORAL

## 2016-09-26 MED ORDER — SODIUM CHLORIDE 0.9 % IV SOLN
250.0000 mL | Freq: Once | INTRAVENOUS | Status: AC
  Administered 2016-09-26: 250 mL via INTRAVENOUS

## 2016-09-26 MED ORDER — DIPHENHYDRAMINE HCL 25 MG PO CAPS
25.0000 mg | ORAL_CAPSULE | Freq: Once | ORAL | Status: AC
  Administered 2016-09-26: 25 mg via ORAL

## 2016-09-26 NOTE — Patient Instructions (Signed)

## 2016-09-26 NOTE — Telephone Encounter (Signed)
Contacted pt and he only wanted to see his doctor so I placed him in her first available which was a Same Day visit on 10/03/16.  Sent message to her to be sure this was ok.  Told pt that if he needed to we could get him in earlier and he could see someone else. He insisted on seeing his doctor. Katharina Caper, Kellsey Sansone D, [name]

## 2016-09-27 LAB — TYPE AND SCREEN
ABO/RH(D): B NEG
Antibody Screen: NEGATIVE
UNIT DIVISION: 0
Unit division: 0

## 2016-09-27 LAB — BPAM RBC
BLOOD PRODUCT EXPIRATION DATE: 201809042359
BLOOD PRODUCT EXPIRATION DATE: 201809112359
ISSUE DATE / TIME: 201808241012
ISSUE DATE / TIME: 201808241012
Unit Type and Rh: 1700
Unit Type and Rh: 1700

## 2016-09-29 ENCOUNTER — Encounter (HOSPITAL_COMMUNITY): Payer: Self-pay | Admitting: Emergency Medicine

## 2016-09-29 ENCOUNTER — Telehealth: Payer: Self-pay | Admitting: *Deleted

## 2016-09-29 ENCOUNTER — Telehealth: Payer: Self-pay | Admitting: Family Medicine

## 2016-09-29 ENCOUNTER — Telehealth: Payer: Self-pay | Admitting: Emergency Medicine

## 2016-09-29 ENCOUNTER — Emergency Department (HOSPITAL_COMMUNITY)
Admission: EM | Admit: 2016-09-29 | Discharge: 2016-09-29 | Disposition: A | Discharge status: Home / Self Care | Payer: Medicare Other | Attending: Emergency Medicine | Admitting: Emergency Medicine

## 2016-09-29 ENCOUNTER — Other Ambulatory Visit (HOSPITAL_COMMUNITY): Payer: Self-pay

## 2016-09-29 DIAGNOSIS — J449 Chronic obstructive pulmonary disease, unspecified: Secondary | ICD-10-CM | POA: Diagnosis not present

## 2016-09-29 DIAGNOSIS — Z91128 Patient's intentional underdosing of medication regimen for other reason: Secondary | ICD-10-CM | POA: Diagnosis not present

## 2016-09-29 DIAGNOSIS — T383X6A Underdosing of insulin and oral hypoglycemic [antidiabetic] drugs, initial encounter: Secondary | ICD-10-CM | POA: Diagnosis not present

## 2016-09-29 DIAGNOSIS — Z7901 Long term (current) use of anticoagulants: Secondary | ICD-10-CM | POA: Diagnosis not present

## 2016-09-29 DIAGNOSIS — I110 Hypertensive heart disease with heart failure: Secondary | ICD-10-CM | POA: Insufficient documentation

## 2016-09-29 DIAGNOSIS — E871 Hypo-osmolality and hyponatremia: Secondary | ICD-10-CM | POA: Diagnosis not present

## 2016-09-29 DIAGNOSIS — Z85828 Personal history of other malignant neoplasm of skin: Secondary | ICD-10-CM | POA: Insufficient documentation

## 2016-09-29 DIAGNOSIS — N189 Chronic kidney disease, unspecified: Secondary | ICD-10-CM | POA: Diagnosis not present

## 2016-09-29 DIAGNOSIS — N184 Chronic kidney disease, stage 4 (severe): Secondary | ICD-10-CM | POA: Diagnosis not present

## 2016-09-29 DIAGNOSIS — R7309 Other abnormal glucose: Secondary | ICD-10-CM | POA: Diagnosis not present

## 2016-09-29 DIAGNOSIS — I129 Hypertensive chronic kidney disease with stage 1 through stage 4 chronic kidney disease, or unspecified chronic kidney disease: Secondary | ICD-10-CM | POA: Insufficient documentation

## 2016-09-29 DIAGNOSIS — R739 Hyperglycemia, unspecified: Secondary | ICD-10-CM

## 2016-09-29 DIAGNOSIS — Z7982 Long term (current) use of aspirin: Secondary | ICD-10-CM | POA: Insufficient documentation

## 2016-09-29 DIAGNOSIS — Z79899 Other long term (current) drug therapy: Secondary | ICD-10-CM | POA: Insufficient documentation

## 2016-09-29 DIAGNOSIS — I2510 Atherosclerotic heart disease of native coronary artery without angina pectoris: Secondary | ICD-10-CM | POA: Diagnosis not present

## 2016-09-29 DIAGNOSIS — E1165 Type 2 diabetes mellitus with hyperglycemia: Secondary | ICD-10-CM | POA: Diagnosis not present

## 2016-09-29 DIAGNOSIS — I5032 Chronic diastolic (congestive) heart failure: Secondary | ICD-10-CM | POA: Diagnosis not present

## 2016-09-29 LAB — BASIC METABOLIC PANEL
ANION GAP: 10 (ref 5–15)
BUN: 56 mg/dL — ABNORMAL HIGH (ref 6–20)
CALCIUM: 8.2 mg/dL — AB (ref 8.9–10.3)
CO2: 20 mmol/L — ABNORMAL LOW (ref 22–32)
Chloride: 99 mmol/L — ABNORMAL LOW (ref 101–111)
Creatinine, Ser: 2.77 mg/dL — ABNORMAL HIGH (ref 0.61–1.24)
GFR, EST AFRICAN AMERICAN: 26 mL/min — AB (ref >60)
GFR, EST NON AFRICAN AMERICAN: 22 mL/min — AB (ref >60)
GLUCOSE: 401 mg/dL — AB (ref 65–99)
Potassium: 4.3 mmol/L (ref 3.5–5.1)
Sodium: 129 mmol/L — ABNORMAL LOW (ref 135–145)

## 2016-09-29 LAB — CBC
HCT: 27.2 % — ABNORMAL LOW (ref 39.0–52.0)
HEMOGLOBIN: 8.8 g/dL — AB (ref 13.0–17.0)
MCH: 22.7 pg — ABNORMAL LOW (ref 26.0–34.0)
MCHC: 32.4 g/dL (ref 30.0–36.0)
MCV: 70.1 fL — ABNORMAL LOW (ref 78.0–100.0)
PLATELETS: 242 10*3/uL (ref 150–400)
RBC: 3.88 MIL/uL — AB (ref 4.22–5.81)
RDW: 20.1 % — ABNORMAL HIGH (ref 11.5–15.5)
WBC: 9.5 10*3/uL (ref 4.0–10.5)

## 2016-09-29 LAB — URINALYSIS, MICROSCOPIC (REFLEX)
Bacteria, UA: NONE SEEN
RBC / HPF: NONE SEEN RBC/hpf (ref 0–5)
SQUAMOUS EPITHELIAL / LPF: NONE SEEN

## 2016-09-29 LAB — URINALYSIS, ROUTINE W REFLEX MICROSCOPIC
BILIRUBIN URINE: NEGATIVE
Glucose, UA: 500 mg/dL — AB
Hgb urine dipstick: NEGATIVE
Ketones, ur: NEGATIVE mg/dL
Leukocytes, UA: NEGATIVE
NITRITE: NEGATIVE
PROTEIN: 100 mg/dL — AB
SPECIFIC GRAVITY, URINE: 1.01 (ref 1.005–1.030)
pH: 5.5 (ref 5.0–8.0)

## 2016-09-29 LAB — CBG MONITORING, ED
GLUCOSE-CAPILLARY: 389 mg/dL — AB (ref 65–99)
GLUCOSE-CAPILLARY: 280 mg/dL — AB (ref 65–99)

## 2016-09-29 LAB — PROTIME-INR
INR: 3.3
Prothrombin Time: 34.3 seconds — ABNORMAL HIGH (ref 11.4–15.2)

## 2016-09-29 MED ORDER — SODIUM CHLORIDE 0.9 % IV BOLUS (SEPSIS)
1000.0000 mL | Freq: Once | INTRAVENOUS | Status: AC
  Administered 2016-09-29: 1000 mL via INTRAVENOUS

## 2016-09-29 MED ORDER — INSULIN ASPART 100 UNIT/ML ~~LOC~~ SOLN
10.0000 [IU] | Freq: Once | SUBCUTANEOUS | Status: AC
  Administered 2016-09-29: 10 [IU] via SUBCUTANEOUS
  Filled 2016-09-29: qty 1

## 2016-09-29 NOTE — Telephone Encounter (Signed)
CM spoke with Ander Purpura, RN at [facility] to see if she was familiar with pt and could provide any insight on EDPs concerns with pt not taking insulin as well as if pt was receiving HHS currently.  Lauren did not see any active HHS orders and stated she would follow him aggressively for follow up as well as medication management.  She stated she had requested him to meet weekly with their pharm D in the past to fill his medication box regularly for him.  CM will update EDP.

## 2016-09-29 NOTE — ED Provider Notes (Signed)
[facility] DEPT Provider Note   CSN: [id number] Arrival date & time: 09/29/16  1419     History   Chief Complaint Chief Complaint  Patient presents with  . Hyperglycemia    HPI Leonard [name] is a 66 y.o. male.  HPI  66 y.o. male with a hx of CHF, CKD, CAD, HTN, HLD, DM2, Chronic DVT on Coumadin, presents to the Emergency Department today via EMS due to hyperglycemia. CBG noted on scene 462. Pt states he has not taken his insulin and has been hyperglycemia over 1 week. When asked why he doesn't take it, pt states he doesn't know. States that he just felt tired and forgets to take it. Denies CP/SOB/ABD pain. No N/V/D. Notes polyuria and polydipsia. No cough/congestion. No fevers. No numbness/tingling. Pt states he is currently being treated by PCP for gout of his foot. No headaches. No neck stiffness. No other symptoms noted.   Past Medical History:  Diagnosis Date  . Cancer ([place])    skin cancer-  . CHF (congestive heart failure) ([place])   . Chronic kidney disease    not on dialysis  . Coronary artery disease   . Gangrene of toe ([place])    right 5th/notes 01/04/2013   . High cholesterol   . Hypertension   . Iron deficiency anemia 08/03/2015  . 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 PAD (peripheral artery disease) ([place])   . Shortness of breath dyspnea    with exertion  . Type II diabetes mellitus ([place])    Type II    Patient Active Problem List   Diagnosis Date Noted  . Long term (current) use of anticoagulants [Z79.01] 09/09/2016  . Chronic deep vein thrombosis (DVT) of femoral vein of left lower extremity ([place]) 09/03/2016  . Pleural effusion   . Oxygen desaturation   . Morbid obesity due to excess calories ([place]) 03/17/2016  . Anemia due to stage 4 chronic kidney disease treated with darbepoetin ([place]) 02/28/2016  . Fatigue 01/21/2016  . Diabetic retinopathy ([place]) 11/13/2015  . Dyspnea 08/28/2015  . CHF (congestive heart failure) ([place])  08/27/2015  . Iron deficiency anemia 08/03/2015  . Claudication ([place])   . Type 2 diabetes mellitus with stage 3 chronic kidney disease, with long-term current use of insulin ([place])   . Secondary hypertension   . Hematuria 06/27/2015  . Skin lesion of face 02/22/2015  . 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 Deficiency anemia 01/15/2015  . CKD (chronic kidney disease) stage 4, GFR 15-29 ml/min (HCC) 01/15/2015  . Hypothyroidism 11/22/2014  . Acute on chronic diastolic congestive heart failure ([place]) 07/04/2014  . COPD GOLD III  02/16/2014  . History of tobacco use 08/23/2013  . 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 CAD (coronary artery disease) 04/06/2013  . PVD - hx of Rt SFA PTA and s/p Rt 4-5th toe amp Dec 2014 04/05/2013  . Essential hypertension 11/25/2012  . Mixed hyperlipidemia 07/17/2008    Past Surgical History:  Procedure Laterality Date  . AMPUTATION Right 01/28/2013   Procedure: RAY AMPUTATION RIGHT  4th & 5th TOE;  Surgeon: Rosetta Posner, MD;  Location: [facility] OR;  Service: Vascular;  Laterality: Right;  . ANGIOPLASTY / STENTING FEMORAL Right 01/13/2013   superficial femoral artery x 2 (3 mm x 60 mm, 4 mm x 60 mm)  . AV FISTULA PLACEMENT Left 02/11/2016   Procedure: LEFT UPPER ARM BRACHIAL CEPHALIC ARTERIOVENOUS (AV) FISTULA CREATION;  Surgeon: Conrad Shubuta, MD;  Location: [facility] OR;  Service: Vascular;  Laterality: Left;  . COLONOSCOPY W/ POLYPECTOMY    . CORONARY ARTERY BYPASS GRAFT N/A 04/07/2013   Procedure: CORONARY ARTERY BYPASS GRAFTING (CABG);  Surgeon: Ivin Poot, MD;  Location: [facility];  Service: Open Heart Surgery;  Laterality: N/A;  . INTRAOPERATIVE TRANSESOPHAGEAL ECHOCARDIOGRAM N/A 04/07/2013   Procedure: INTRAOPERATIVE TRANSESOPHAGEAL ECHOCARDIOGRAM;  Surgeon: Ivin Poot, MD;  Location: [facility];  Service: Open Heart Surgery;  Laterality: N/A;  . LEFT HEART CATHETERIZATION WITH CORONARY ANGIOGRAM N/A 04/05/2013   Procedure: LEFT HEART CATHETERIZATION  WITH CORONARY ANGIOGRAM;  Surgeon: Peter M Martinique, MD;  Location: [name] CATH LAB;  Service: Cardiovascular;  Laterality: N/A;  . LOWER EXTREMITY ANGIOGRAM Bilateral 01/06/2013   Procedure: LOWER EXTREMITY ANGIOGRAM;  Surgeon: Conrad Farwell, MD;  Location: [facility] CATH LAB;  Service: Cardiovascular;  Laterality: Bilateral;  . LOWER EXTREMITY ANGIOGRAM Right 01/13/2013   Procedure: LOWER EXTREMITY ANGIOGRAM;  Surgeon: Conrad Malverne, MD;  Location: [facility] CATH LAB;  Service: Cardiovascular;  Laterality: Right;  . SKIN CANCER EXCISION  2017  . THROAT SURGERY  1983   polyps  . TONSILLECTOMY AND ADENOIDECTOMY  ~ 1965       Home Medications    Prior to Admission medications   Medication Sig Start Date End Date Taking? Authorizing Provider  allopurinol (ZYLOPRIM) 100 MG tablet Take 1 tablet (100 mg total) by mouth daily. 09/21/16  Yes Arbutus Leas, NP  amLODipine (NORVASC) 10 MG tablet Take 1 tablet (10 mg total) by mouth daily. 02/19/16  Yes Clegg, Amy D, NP  aspirin 81 MG tablet TAKE 81 MG BY MOUTH EVERY DAY Patient taking differently: Take 81 mg by mouth daily.  02/11/16  Yes Virgina Jock A, PA-C  atorvastatin (LIPITOR) 40 MG tablet Take 40 mg by mouth daily.   Yes [provider]  carvedilol (COREG) 25 MG tablet Take 2 tablets (50 mg total) by mouth 2 (two) times daily with a meal. Patient taking differently: Take 25 mg by mouth 2 (two) times daily with a meal.  10/23/15  Yes Tillery, Satira Mccallum, PA-C  cloNIDine (CATAPRES) 0.2 MG tablet Take 2 tablets (0.4 mg total) by mouth 2 (two) times daily. 06/10/16  Yes Clegg, Amy D, NP  doxazosin (CARDURA) 8 MG tablet Take 1 tablet (8 mg total) by mouth 2 (two) times daily. 01/07/16  Yes Larey Dresser, MD  hydrALAZINE (APRESOLINE) 100 MG tablet Take 100 mg by mouth every 8 (eight) hours.   Yes [provider]  insulin aspart (NOVOLOG FLEXPEN) 100 UNIT/ML FlexPen Inject 15 Units into the skin 2 (two) times daily.    Yes [provider]    Insulin Glargine (LANTUS SOLOSTAR) 100 UNIT/ML Solostar Pen Inject 40 Units into the skin every morning.   Yes [provider]  isosorbide mononitrate (IMDUR) 60 MG 24 hr tablet TAKE 1 TABLET BY MOUTH DAILY. Patient taking differently: TAKE 60mg  BY MOUTH once DAILY. 08/26/16  Yes Shirley Friar, PA-C  levothyroxine (SYNTHROID, LEVOTHROID) 50 MCG tablet TAKE ONE (1) TABLET BY MOUTH EVERY DAY BEFORE BREAKFAST Patient taking differently: TAKE 36mcg BY MOUTH EVERY DAY BEFORE BREAKFAST 08/12/16  Yes [name], MD  oxyCODONE-acetaminophen (PERCOCET) 5-325 MG tablet Take 2 tablets by mouth every 4 (four) hours as needed. 09/21/16  Yes Charlesetta Shanks, MD  pantoprazole (PROTONIX) 40 MG tablet TAKE ONE (1) TABLET BY MOUTH EVERY DAY Patient taking differently: TAKE 40mg  TABLET BY MOUTH EVERY DAY 05/27/16  Yes  [name], MD  potassium chloride SA (K-DUR,KLOR-CON) 20 MEQ tablet Take 20 mEq by mouth daily.   Yes [provider]  torsemide (DEMADEX) 20 MG tablet Take 3 tablets (60 mg total) by mouth daily. May take an extra 20 mg Daily for weight gain above 212 lbs 09/17/16  Yes Jettie Booze E, NP  warfarin (COUMADIN) 5 MG tablet Take 2.5-5 mg by mouth See admin instructions. Take 2.5 mg (1/2 tablet) daily except take 5 mg (1 tablet) on Monday and Friday   Yes [provider]  ferrous sulfate (FERROUSUL) 325 (65 FE) MG tablet Take 1 tablet (325 mg total) by mouth 2 (two) times daily with a meal. Patient not taking: Reported on 09/29/2016 05/20/16   Eloise Levels, MD  nitroGLYCERIN (NITROSTAT) 0.4 MG SL tablet Place 1 tablet (0.4 mg total) under the tongue every 5 (five) minutes as needed for chest pain. 06/06/15   Mercy Riding, MD    Family History Family History  Problem Relation Age of Onset  . Cancer Mother        lung cancer  . Hypertension Mother   . Cancer Sister        patient thinks it was uterine cancer  . Hypertension Sister   . Heart attack  Sister   . Stroke Neg Hx     Social History Social History  Substance Use Topics  . Smoking status: Former Smoker    Packs/day: 1.00    Years: 46.00    Types: Cigarettes    Start date: 02/04/1967    Quit date: 02/03/2013  . Smokeless tobacco: Never Used     Comment: Doing well with quitting.  . Alcohol use No     Allergies   No known allergies   Review of Systems Review of Systems ROS reviewed and all are negative for acute change except as noted in the HPI.  Physical Exam Updated Vital Signs BP (!) 134/54 (BP Location: Right Arm)   Pulse 67   Temp 97.9 F (36.6 C) (Oral)   Resp 18   SpO2 100%   Physical Exam  Constitutional: He is oriented to person, place, and time. Vital signs are normal. He appears well-developed and well-nourished. No distress.  HENT:  Head: Normocephalic and atraumatic.  Right Ear: Hearing, tympanic membrane, external ear and ear canal normal.  Left Ear: Hearing, tympanic membrane, external ear and ear canal normal.  Nose: Nose normal.  Mouth/Throat: Uvula is midline, oropharynx is clear and moist and mucous membranes are normal. No trismus in the jaw. No oropharyngeal exudate, posterior oropharyngeal erythema or tonsillar abscesses.  Eyes: Pupils are equal, round, and reactive to light. Conjunctivae and EOM are normal.  Neck: Normal range of motion. Neck supple. No tracheal deviation present.  Cardiovascular: Normal rate, regular rhythm, S1 normal, S2 normal, normal heart sounds, intact distal pulses and normal pulses.   Pulmonary/Chest: Effort normal and breath sounds normal. No respiratory distress. He has no decreased breath sounds. He has no wheezes. He has no rhonchi. He has no rales.  Abdominal: Normal appearance and bowel sounds are normal. There is no tenderness.  Musculoskeletal: Normal range of motion.  Neurological: He is alert and oriented to person, place, and time.  Skin: Skin is warm and dry.  Psychiatric: He has a normal mood and  affect. His speech is normal and behavior is normal. Thought content normal.   ED Treatments / Results  Labs (all labs ordered are listed, but only abnormal  results are displayed) Labs Reviewed  BASIC METABOLIC PANEL - Abnormal; Notable for the following:       Result Value   Sodium 129 (*)    Chloride 99 (*)    CO2 20 (*)    Glucose, Bld 401 (*)    BUN 56 (*)    Creatinine, Ser 2.77 (*)    Calcium 8.2 (*)    GFR calc non Af Amer 22 (*)    GFR calc Af Amer 26 (*)    All other components within normal limits  CBC - Abnormal; Notable for the following:    RBC 3.88 (*)    Hemoglobin 8.8 (*)    HCT 27.2 (*)    MCV 70.1 (*)    MCH 22.7 (*)    RDW 20.1 (*)    All other components within normal limits  URINALYSIS, ROUTINE W REFLEX MICROSCOPIC - Abnormal; Notable for the following:    Glucose, UA 500 (*)    Protein, ur 100 (*)    All other components within normal limits  PROTIME-INR - Abnormal; Notable for the following:    Prothrombin Time 34.3 (*)    All other components within normal limits  CBG MONITORING, ED - Abnormal; Notable for the following:    Glucose-Capillary 389 (*)    All other components within normal limits  URINALYSIS, MICROSCOPIC (REFLEX)    EKG  EKG Interpretation None       Radiology No results found.  Procedures Procedures (including critical care time)  Medications Ordered in ED Medications  sodium chloride 0.9 % bolus 1,000 mL (not administered)  insulin aspart (novoLOG) injection 10 Units (not administered)     Initial Impression / Assessment and Plan / ED Course  I have reviewed the triage vital signs and the nursing notes.  Pertinent labs & imaging results that were available during my care of the patient were reviewed by me and considered in my medical decision making (see chart for details).  Final Clinical Impressions(s) / ED Diagnoses  {I have reviewed and evaluated the relevant laboratory values.   {I have reviewed the relevant  previous healthcare records.  {I obtained HPI from historian.   ED Course:  Assessment: Pt is a 66 y.o. male with a hx of CHF, CKD, CAD, HTN, HLD, DM2, presents to the Emergency Department today via EMS due to hyperglycemia. CBG noted on scene 462. Pt states he has not taken his insulin and has been hyperglycemia over 1 week. When asked why he doesn't take it, pt states he doesn't know. States that he just felt tired and forgets to take it. Denies CP/SOB/ABD pain. No N/V/D. Notes polyuria and polydipsia. No cough/congestion. No fevers. No numbness/tingling. Pt states he is currently being treated by PCP for gout of his foot. No headaches. No neck stiffness. On exam, pt in NAD. Nontoxic/nonseptic appearing. VSS. Afebrile. Lungs CTA. Heart RRR. Abdomen nontender soft. POC Glucose 389. CBC unremarkable. Potassium WNL. Creatinine baseline. Gap 10. UA without ketones. Given NS bolus 1L in ED as well as 10U Insulin. Consulted Case Management for Home Health assistance. Plan is to DC home with follow up to PCP. At time of discharge, Patient is in no acute distress. Vital Signs are stable. Patient is able to ambulate. Patient able to tolerate PO.   Insulin regiment: Lantus 40U AM. Novolog 15U BID.   Disposition/Plan:  DC Home Additional Verbal discharge instructions given and discussed with patient.  Pt Instructed to f/u with PCP in the  next week for evaluation and treatment of symptoms. Return precautions given Pt acknowledges and agrees with plan  Supervising Physician Orlie Dakin, MD  Final diagnoses:  Hyperglycemia  Hyponatremia  Chronic kidney disease, unspecified CKD stage    New Prescriptions New Prescriptions   No medications on file     Shary Decamp, Hershal Coria 09/29/16 [place], [place], MD 09/29/16 2220

## 2016-09-29 NOTE — ED Provider Notes (Signed)
Patient reportedly forgetting to take his insulin periodically. Noted to be hyperglycemic patient is alert appears in no distress   Orlie Dakin, MD 09/29/16 [phone]

## 2016-09-29 NOTE — ED Triage Notes (Signed)
Per EMS-states CBG 462, states patient has not taken his insulin-states he has has been hyperglycemic for over a week

## 2016-09-29 NOTE — Care Management Note (Signed)
Case Management Note  CM consulted for possible HHS.  EDP has concern about pt taking his insulin medication at home.  CM noted pt has multiple appointments scheduled in a short amount of time.  Pt is active with Cardiology CHF outreach and has an EMT come weekly. Pt states he also has an aid come daily to his house, he believes from [employer]. Pt did not appear interested in additional HHS as CM could not get a clear answer from him.  Family was not at bedside either attempt at contact.  Called Lauren, RN with Family Medicine who states he doesn't appear to have any active HHS orders, that he lives with his sister, and has a medication assistance option through the office for medication management. She will follow pt closely on D/C.  Roderic Palau, PA who agreed pt has enough services at this time.  No further CM needs noted.

## 2016-09-29 NOTE — Telephone Encounter (Signed)
Pt's sister called to inform us that the pt was having trouble controlling his urine. Advised that the pt should follow-up with PCP or another MD. She stated she thought it was the Coumadin that was causing this educated sister on side effects on Coumadin & reminded of next appt this Wednesday.

## 2016-09-29 NOTE — Discharge Instructions (Addendum)
Please read and follow all provided instructions.  Your diagnoses today include:  1. Hyperglycemia   2. Hyponatremia   3. Chronic kidney disease, unspecified CKD stage     Tests performed today include: Vital signs. See below for your results today.   Medications prescribed:  Take as prescribed. PLEASE TAKE YOUR INSULIN   Home care instructions:  Follow any educational materials contained in this packet.  Follow-up instructions: Please follow-up with your primary care provider for further evaluation of symptoms and treatment   Return instructions:  Please return to the Emergency Department if you do not get better, if you get worse, or new symptoms OR  - Fever (temperature greater than 101.78F)  - Bleeding that does not stop with holding pressure to the area    -Severe pain (please note that you may be more sore the day after your accident)  - Chest Pain  - Difficulty breathing  - Severe nausea or vomiting  - Inability to tolerate food and liquids  - Passing out  - Skin becoming red around your wounds  - Change in mental status (confusion or lethargy)  - New numbness or weakness    Please return if you have any other emergent concerns.  Additional Information:  Your vital signs today were: BP (!) 153/67    Pulse 66    Temp 97.9 F (36.6 C) (Oral)    Resp 16    SpO2 97%  If your blood pressure (BP) was elevated above 135/85 this visit, please have this repeated by your doctor within one month. ---------------

## 2016-09-29 NOTE — Telephone Encounter (Signed)
Glucose monitoring machine is not working and he needs a prescription for new one sent to [facility] on [address] at  #  [phone].  He will need for as soon as he gets back from ED.Marland Kitchen

## 2016-09-29 NOTE — Progress Notes (Signed)
Paramedicine Encounter    Patient ID: Leonard [name], male    DOB: 1951/01/16, 66 y.o.   MRN: [id number]   Patient Care Team: [name], MD as PCP - General (Family Medicine) [name], MD as Consulting Physician (Nephrology)  Patient Active Problem List   Diagnosis Date Noted  . Long term (current) use of anticoagulants [Z79.01] 09/09/2016  . Chronic deep vein thrombosis (DVT) of femoral vein of left lower extremity ([place]) 09/03/2016  . Pleural effusion   . Oxygen desaturation   . Morbid obesity due to excess calories ([place]) 03/17/2016  . Anemia due to stage 4 chronic kidney disease treated with darbepoetin ([place]) 02/28/2016  . Fatigue 01/21/2016  . Diabetic retinopathy ([place]) 11/13/2015  . Dyspnea 08/28/2015  . CHF (congestive heart failure) ([place]) 08/27/2015  . Iron deficiency anemia 08/03/2015  . Claudication ([place])   . Type 2 diabetes mellitus with stage 3 chronic kidney disease, with long-term current use of insulin ([place])   . Secondary hypertension   . Hematuria 06/27/2015  . Skin lesion of face 02/22/2015  . 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 Deficiency anemia 01/15/2015  . CKD (chronic kidney disease) stage 4, GFR 15-29 ml/min (HCC) 01/15/2015  . Hypothyroidism 11/22/2014  . Acute on chronic diastolic congestive heart failure ([place]) 07/04/2014  . COPD GOLD III  02/16/2014  . History of tobacco use 08/23/2013  . 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 CAD (coronary artery disease) 04/06/2013  . PVD - hx of Rt SFA PTA and s/p Rt 4-5th toe amp Dec 2014 04/05/2013  . Essential hypertension 11/25/2012  . Mixed hyperlipidemia 07/17/2008    Current Outpatient Prescriptions:  .  allopurinol (ZYLOPRIM) 100 MG tablet, Take 1 tablet (100 mg total) by mouth daily., Disp: 30 tablet, Rfl: 6 .  amLODipine (NORVASC) 10 MG tablet, Take 1 tablet (10 mg total) by mouth daily., Disp: 30 tablet, Rfl: 6 .  aspirin 81 MG tablet, TAKE 81 MG BY MOUTH EVERY  DAY (Patient taking differently: Take 81 mg by mouth daily. ), Disp: 30 tablet, Rfl: 3 .  atorvastatin (LIPITOR) 40 MG tablet, Take 40 mg by mouth daily., Disp: , Rfl:  .  carvedilol (COREG) 25 MG tablet, Take 2 tablets (50 mg total) by mouth 2 (two) times daily with a meal. (Patient taking differently: Take 25 mg by mouth 2 (two) times daily with a meal. ), Disp: 120 tablet, Rfl: 3 .  cloNIDine (CATAPRES) 0.2 MG tablet, Take 2 tablets (0.4 mg total) by mouth 2 (two) times daily., Disp: 120 tablet, Rfl: 3 .  doxazosin (CARDURA) 8 MG tablet, Take 1 tablet (8 mg total) by mouth 2 (two) times daily., Disp: 60 tablet, Rfl: 6 .  ferrous sulfate (FERROUSUL) 325 (65 FE) MG tablet, Take 1 tablet (325 mg total) by mouth 2 (two) times daily with a meal. (Patient not taking: Reported on 09/29/2016), Disp: 60 tablet, Rfl: 3 .  hydrALAZINE (APRESOLINE) 100 MG tablet, Take 100 mg by mouth every 8 (eight) hours., Disp: , Rfl:  .  insulin aspart (NOVOLOG FLEXPEN) 100 UNIT/ML FlexPen, Inject 15 Units into the skin 2 (two) times daily. , Disp: , Rfl:  .  Insulin Glargine (LANTUS SOLOSTAR) 100 UNIT/ML Solostar Pen, Inject 40 Units into the skin every morning., Disp: , Rfl:  .  isosorbide mononitrate (IMDUR) 60 MG 24 hr tablet, TAKE 1 TABLET BY MOUTH DAILY. (Patient taking differently: TAKE 60mg  BY MOUTH once DAILY.), Disp: 30 tablet, Rfl: 3 .  levothyroxine (SYNTHROID, LEVOTHROID) 50 MCG tablet, TAKE ONE (1) TABLET BY MOUTH EVERY DAY BEFORE BREAKFAST (Patient taking differently: TAKE 80mcg BY MOUTH EVERY DAY BEFORE BREAKFAST), Disp: 30 tablet, Rfl: 3 .  nitroGLYCERIN (NITROSTAT) 0.4 MG SL tablet, Place 1 tablet (0.4 mg total) under the tongue every 5 (five) minutes as needed for chest pain., Disp: 30 tablet, Rfl: 12 .  oxyCODONE-acetaminophen (PERCOCET) 5-325 MG tablet, Take 2 tablets by mouth every 4 (four) hours as needed., Disp: 20 tablet, Rfl: 0 .  pantoprazole (PROTONIX) 40 MG tablet, TAKE ONE (1) TABLET BY MOUTH  EVERY DAY (Patient taking differently: TAKE 40mg  TABLET BY MOUTH EVERY DAY), Disp: 30 tablet, Rfl: 3 .  potassium chloride SA (K-DUR,KLOR-CON) 20 MEQ tablet, Take 20 mEq by mouth daily., Disp: , Rfl:  .  torsemide (DEMADEX) 20 MG tablet, Take 3 tablets (60 mg total) by mouth daily. May take an extra 20 mg Daily for weight gain above 212 lbs, Disp: 90 tablet, Rfl: 6 .  warfarin (COUMADIN) 5 MG tablet, Take 2.5-5 mg by mouth See admin instructions. Take 2.5 mg (1/2 tablet) daily except take 5 mg (1 tablet) on Monday and Friday, Disp: , Rfl:  Allergies  Allergen Reactions  . No Known Allergies      Social History   Social History  . Marital status: Single    Spouse name: N/A  . Number of children: N/A  . Years of education: N/A   Occupational History  . Not on file.   Social History Main Topics  . Smoking status: Former Smoker    Packs/day: 1.00    Years: 46.00    Types: Cigarettes    Start date: 02/04/1967    Quit date: 02/03/2013  . Smokeless tobacco: Never Used     Comment: Doing well with quitting.  . Alcohol use No  . Drug use: No  . Sexual activity: Not Currently   Other Topics Concern  . Not on file   Social History Narrative   Lives with sister, Leonard [name] in [place].  Retired - former Sports coach.    Physical Exam      Future Appointments Date Time Provider [place]  10/01/2016 10:00 AM [facility] PA/NP [facility] None  10/01/2016 11:30 AM [facility]  10/03/2016 2:50 PM [name], MD [facility] [place]  10/09/2016 2:45 PM [facility] LAB 5 [facility] None  10/09/2016 3:15 PM [facility] INJ NURSE [facility] None  10/10/2016 10:30 AM [name], MD [facility] [place]  10/23/2016 2:45 PM [facility] LAB 2 [facility] None  10/23/2016 3:15 PM [facility] INJ NURSE [facility] None  10/29/2016 10:00 AM Gardiner Barefoot, DPM [facility]  11/06/2016 2:45 PM [facility] LAB 5 [facility] None  11/06/2016  3:15 PM [facility] INJ NURSE [facility] None  11/20/2016 2:45 PM [facility] LAB 6 [facility] None  11/20/2016 3:15 PM Gorsuch, Ni, MD [facility] None  11/20/2016 3:45 PM [facility] INJ NURSE [facility] None  12/19/2016 2:00 PM [facility] VASC 3 [facility]  12/19/2016 2:45 PM Nickel, Sharmon Leyden, NP [facility]   BP (!) 168/60   Pulse 70   Resp 15   SpO2 98%  Weight yesterday-??? Last visit weight-??? [id number]  Heather with clinic called and reported that pts sister called in stating he wasn't feeling good and has gotten worse over the wknd.  When I arrived pt was sitting in chair, family reports he isnt eating well. Pt states he has a boil on his rear, he ate very small amount of  breakfast-half a sandwich-  Family reports he has been 'wetting" all over the place and cant control his urine. He has missed last wed afternoon dose, last wed pm dose, sun noon dose of his meds. Unknown when last time he weighed was. Pt reports his breathing is doing alright,  He is sluggish, his CBG is elevated, not taking insulin like he should but hes also not eating much either.  He agreed to go to ER for further eval.   ACTION: Home visit completed  Marylouise Stacks, EMT-Paramedic 09/29/16

## 2016-09-29 NOTE — ED Notes (Signed)
Bed: TD32 Expected date:  Expected time:  Means of arrival:  Comments: EMS-hyperglycemia

## 2016-09-30 ENCOUNTER — Other Ambulatory Visit (HOSPITAL_COMMUNITY): Payer: Self-pay

## 2016-09-30 ENCOUNTER — Other Ambulatory Visit: Payer: Self-pay | Admitting: Family Medicine

## 2016-09-30 MED ORDER — GLUCOSE BLOOD VI STRP: ORAL_STRIP | 12 refills | Status: DC

## 2016-09-30 MED ORDER — ACCU-CHEK AVIVA PLUS W/DEVICE KIT: 1.0000 | PACK | Freq: Every day | 3 refills | Status: DC

## 2016-09-30 MED ORDER — ACCU-CHEK MULTICLIX LANCETS MISC: 3 refills | Status: DC

## 2016-09-30 NOTE — Progress Notes (Signed)
Paramedicine Encounter    Patient ID: Leonard [name], male    DOB: Oct 08, 1950, 66 y.o.   MRN: [id number]    Patient Care Team: [name], MD as PCP - General (Family Medicine) [name], MD as Consulting Physician (Nephrology)  Patient Active Problem List   Diagnosis Date Noted  . Long term (current) use of anticoagulants [Z79.01] 09/09/2016  . Chronic deep vein thrombosis (DVT) of femoral vein of left lower extremity ([place]) 09/03/2016  . Pleural effusion   . Oxygen desaturation   . Morbid obesity due to excess calories ([place]) 03/17/2016  . Anemia due to stage 4 chronic kidney disease treated with darbepoetin ([place]) 02/28/2016  . Fatigue 01/21/2016  . Diabetic retinopathy ([place]) 11/13/2015  . Dyspnea 08/28/2015  . CHF (congestive heart failure) ([place]) 08/27/2015  . Iron deficiency anemia 08/03/2015  . Claudication ([place])   . Type 2 diabetes mellitus with stage 3 chronic kidney disease, with long-term current use of insulin ([place])   . Secondary hypertension   . Hematuria 06/27/2015  . Skin lesion of face 02/22/2015  . 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 Deficiency anemia 01/15/2015  . CKD (chronic kidney disease) stage 4, GFR 15-29 ml/min (HCC) 01/15/2015  . Hypothyroidism 11/22/2014  . Acute on chronic diastolic congestive heart failure ([place]) 07/04/2014  . COPD GOLD III  02/16/2014  . History of tobacco use 08/23/2013  . 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 CAD (coronary artery disease) 04/06/2013  . PVD - hx of Rt SFA PTA and s/p Rt 4-5th toe amp Dec 2014 04/05/2013  . Essential hypertension 11/25/2012  . Mixed hyperlipidemia 07/17/2008    Current Outpatient Prescriptions:  .  allopurinol (ZYLOPRIM) 100 MG tablet, Take 1 tablet (100 mg total) by mouth daily., Disp: 30 tablet, Rfl: 6 .  amLODipine (NORVASC) 10 MG tablet, Take 1 tablet (10 mg total) by mouth daily., Disp: 30 tablet, Rfl: 6 .  aspirin 81 MG tablet, TAKE 81 MG BY MOUTH  EVERY DAY (Patient taking differently: Take 81 mg by mouth daily. ), Disp: 30 tablet, Rfl: 3 .  atorvastatin (LIPITOR) 40 MG tablet, Take 40 mg by mouth daily., Disp: , Rfl:  .  carvedilol (COREG) 25 MG tablet, Take 2 tablets (50 mg total) by mouth 2 (two) times daily with a meal. (Patient taking differently: Take 25 mg by mouth 2 (two) times daily with a meal. ), Disp: 120 tablet, Rfl: 3 .  cloNIDine (CATAPRES) 0.2 MG tablet, Take 2 tablets (0.4 mg total) by mouth 2 (two) times daily., Disp: 120 tablet, Rfl: 3 .  doxazosin (CARDURA) 8 MG tablet, Take 1 tablet (8 mg total) by mouth 2 (two) times daily., Disp: 60 tablet, Rfl: 6 .  ferrous sulfate (FERROUSUL) 325 (65 FE) MG tablet, Take 1 tablet (325 mg total) by mouth 2 (two) times daily with a meal., Disp: 60 tablet, Rfl: 3 .  hydrALAZINE (APRESOLINE) 100 MG tablet, Take 100 mg by mouth every 8 (eight) hours., Disp: , Rfl:  .  insulin aspart (NOVOLOG FLEXPEN) 100 UNIT/ML FlexPen, Inject 15 Units into the skin 2 (two) times daily. , Disp: , Rfl:  .  Insulin Glargine (LANTUS SOLOSTAR) 100 UNIT/ML Solostar Pen, Inject 40 Units into the skin every morning., Disp: , Rfl:  .  isosorbide mononitrate (IMDUR) 60 MG 24 hr tablet, TAKE 1 TABLET BY MOUTH DAILY. (Patient taking differently: TAKE 60mg  BY MOUTH once DAILY.), Disp: 30 tablet, Rfl: 3 .  levothyroxine (SYNTHROID, LEVOTHROID)  50 MCG tablet, TAKE ONE (1) TABLET BY MOUTH EVERY DAY BEFORE BREAKFAST (Patient taking differently: TAKE 1mcg BY MOUTH EVERY DAY BEFORE BREAKFAST), Disp: 30 tablet, Rfl: 3 .  pantoprazole (PROTONIX) 40 MG tablet, TAKE ONE (1) TABLET BY MOUTH EVERY DAY (Patient taking differently: TAKE 40mg  TABLET BY MOUTH EVERY DAY), Disp: 30 tablet, Rfl: 3 .  potassium chloride SA (K-DUR,KLOR-CON) 20 MEQ tablet, Take 20 mEq by mouth daily., Disp: , Rfl:  .  torsemide (DEMADEX) 20 MG tablet, Take 3 tablets (60 mg total) by mouth daily. May take an extra 20 mg Daily for weight gain above 212 lbs,  Disp: 90 tablet, Rfl: 6 .  warfarin (COUMADIN) 5 MG tablet, Take 2.5-5 mg by mouth See admin instructions. Take 2.5 mg (1/2 tablet) daily except take 5 mg (1 tablet) on Monday and Friday, Disp: , Rfl:  .  nitroGLYCERIN (NITROSTAT) 0.4 MG SL tablet, Place 1 tablet (0.4 mg total) under the tongue every 5 (five) minutes as needed for chest pain., Disp: 30 tablet, Rfl: 12 .  oxyCODONE-acetaminophen (PERCOCET) 5-325 MG tablet, Take 2 tablets by mouth every 4 (four) hours as needed., Disp: 20 tablet, Rfl: 0 Allergies  Allergen Reactions  . No Known Allergies      Social History   Social History  . Marital status: Single    Spouse name: N/A  . Number of children: N/A  . Years of education: N/A   Occupational History  . Not on file.   Social History Main Topics  . Smoking status: Former Smoker    Packs/day: 1.00    Years: 46.00    Types: Cigarettes    Start date: 02/04/1967    Quit date: 02/03/2013  . Smokeless tobacco: Never Used     Comment: Doing well with quitting.  . Alcohol use No  . Drug use: No  . Sexual activity: Not Currently   Other Topics Concern  . Not on file   Social History Narrative   Lives with sister, Leonard [name] in [place].  Retired - former Sports coach.    Physical Exam  Abdominal: He exhibits no distension. There is no tenderness. There is no guarding.  Musculoskeletal: He exhibits no edema.  Skin: Skin is warm and dry. He is not diaphoretic.          ReDS Vest - 09/30/16 1000      ReDS Vest   Fitting Posture (P)  Sitting      Future Appointments Date Time Provider [place]  10/01/2016 10:00 AM [facility] PA/NP [facility] None  10/01/2016 11:30 AM [facility]  10/03/2016 2:50 PM [name], MD [facility] [place]  10/09/2016 2:45 PM [facility] LAB 5 [facility] None  10/09/2016 3:15 PM [facility] INJ NURSE [facility] None  10/10/2016 10:30 AM [name], MD [facility] [place]  10/23/2016  2:45 PM [facility] LAB 2 [facility] None  10/23/2016 3:15 PM [facility] INJ NURSE [facility] None  10/29/2016 10:00 AM Gardiner Barefoot, DPM [facility]  11/06/2016 2:45 PM [facility] LAB 5 [facility] None  11/06/2016 3:15 PM [facility] INJ NURSE [facility] None  11/20/2016 2:45 PM [facility] LAB 6 [facility] None  11/20/2016 3:15 PM Gorsuch, Ni, MD [facility] None  11/20/2016 3:45 PM [facility] INJ NURSE [facility] None  12/19/2016 2:00 PM [facility] V[facility] 3 [facility]  12/19/2016 2:45 PM Nickel, Sharmon Leyden, NP [facility]    ATF pt CAO x4 sitting in the recliner. Pt was seen yesterday for elevated blood sugar.  He stated  that his glucometer is malfunctioning. Pt's cbg read 384 on my machine and 322 on his.  Pt denies sob, dizziness, headache and chest pain.  His family stated that he looks a lot better today than yesterday.  Pt has already taken his morning medications.  rx bottles verified pill box refilled.   Pt ate stew beef for breakfast today.   **rx bottles called in: Potassium Isosorbide Pantoprazole [place]   **warfarin 69mr mon/fri; 2.5mg  sun, tues, wed, thurs, sat  BP 138/62 (BP Location: Right Arm, Patient Position: Sitting, Cuff Size: Normal)   Pulse 68   Resp 12   Wt 200 lb 8 oz (90.9 kg)   SpO2 98%   BMI 30.49 kg/m    CBG 384 REDS clip reading:    Weight yesterday-cant remember Last visit weight-207    Kirtis Challis, EMT Paramedic 09/30/2016    ACTION: Home visit completed

## 2016-09-30 NOTE — Telephone Encounter (Signed)
Prescriptions sent to pharmacy

## 2016-09-30 NOTE — Telephone Encounter (Signed)
accu-chek aviva care kit and test strips need to go to [facility] on [address] because [employer] part B.

## 2016-10-01 ENCOUNTER — Telehealth: Payer: Self-pay | Admitting: *Deleted

## 2016-10-01 ENCOUNTER — Ambulatory Visit (INDEPENDENT_AMBULATORY_CARE_PROVIDER_SITE_OTHER): Payer: Medicare Other | Admitting: *Deleted

## 2016-10-01 ENCOUNTER — Ambulatory Visit (HOSPITAL_COMMUNITY)
Admission: RE | Admit: 2016-10-01 | Discharge: 2016-10-01 | Disposition: A | Discharge status: Home / Self Care | Payer: Medicare Other | Source: Ambulatory Visit | Attending: Internal Medicine | Admitting: Internal Medicine

## 2016-10-01 VITALS — BP 134/66 | HR 65 | Wt 201.0 lb

## 2016-10-01 DIAGNOSIS — J449 Chronic obstructive pulmonary disease, unspecified: Secondary | ICD-10-CM | POA: Diagnosis not present

## 2016-10-01 DIAGNOSIS — Z86718 Personal history of other venous thrombosis and embolism: Secondary | ICD-10-CM | POA: Insufficient documentation

## 2016-10-01 DIAGNOSIS — E1151 Type 2 diabetes mellitus with diabetic peripheral angiopathy without gangrene: Secondary | ICD-10-CM | POA: Insufficient documentation

## 2016-10-01 DIAGNOSIS — N184 Chronic kidney disease, stage 4 (severe): Secondary | ICD-10-CM

## 2016-10-01 DIAGNOSIS — Z951 Presence of aortocoronary bypass graft: Secondary | ICD-10-CM | POA: Diagnosis not present

## 2016-10-01 DIAGNOSIS — I82512 Chronic embolism and thrombosis of left femoral vein: Secondary | ICD-10-CM

## 2016-10-01 DIAGNOSIS — E039 Hypothyroidism, unspecified: Secondary | ICD-10-CM | POA: Insufficient documentation

## 2016-10-01 DIAGNOSIS — G4733 Obstructive sleep apnea (adult) (pediatric): Secondary | ICD-10-CM | POA: Diagnosis not present

## 2016-10-01 DIAGNOSIS — Z7901 Long term (current) use of anticoagulants: Secondary | ICD-10-CM | POA: Diagnosis not present

## 2016-10-01 DIAGNOSIS — J439 Emphysema, unspecified: Secondary | ICD-10-CM

## 2016-10-01 DIAGNOSIS — I2510 Atherosclerotic heart disease of native coronary artery without angina pectoris: Secondary | ICD-10-CM | POA: Insufficient documentation

## 2016-10-01 DIAGNOSIS — Z79899 Other long term (current) drug therapy: Secondary | ICD-10-CM | POA: Insufficient documentation

## 2016-10-01 DIAGNOSIS — M109 Gout, unspecified: Secondary | ICD-10-CM | POA: Diagnosis not present

## 2016-10-01 DIAGNOSIS — E1165 Type 2 diabetes mellitus with hyperglycemia: Secondary | ICD-10-CM | POA: Diagnosis not present

## 2016-10-01 DIAGNOSIS — N185 Chronic kidney disease, stage 5: Secondary | ICD-10-CM | POA: Diagnosis not present

## 2016-10-01 DIAGNOSIS — I132 Hypertensive heart and chronic kidney disease with heart failure and with stage 5 chronic kidney disease, or end stage renal disease: Secondary | ICD-10-CM | POA: Insufficient documentation

## 2016-10-01 DIAGNOSIS — E785 Hyperlipidemia, unspecified: Secondary | ICD-10-CM | POA: Insufficient documentation

## 2016-10-01 DIAGNOSIS — I779 Disorder of arteries and arterioles, unspecified: Secondary | ICD-10-CM

## 2016-10-01 DIAGNOSIS — Z7982 Long term (current) use of aspirin: Secondary | ICD-10-CM | POA: Diagnosis not present

## 2016-10-01 DIAGNOSIS — I5032 Chronic diastolic (congestive) heart failure: Secondary | ICD-10-CM | POA: Diagnosis not present

## 2016-10-01 DIAGNOSIS — Z87891 Personal history of nicotine dependence: Secondary | ICD-10-CM | POA: Diagnosis not present

## 2016-10-01 DIAGNOSIS — Z9981 Dependence on supplemental oxygen: Secondary | ICD-10-CM | POA: Diagnosis not present

## 2016-10-01 DIAGNOSIS — Z794 Long term (current) use of insulin: Secondary | ICD-10-CM | POA: Insufficient documentation

## 2016-10-01 DIAGNOSIS — E1122 Type 2 diabetes mellitus with diabetic chronic kidney disease: Secondary | ICD-10-CM | POA: Insufficient documentation

## 2016-10-01 LAB — POCT INR: INR: 4.7

## 2016-10-01 NOTE — Telephone Encounter (Signed)
Called patient to give advise on shoes.

## 2016-10-01 NOTE — Patient Instructions (Signed)
Your physician recommends that you schedule a follow-up appointment in: 2 months    Do the following things EVERYDAY: 1) Weigh yourself in the morning before breakfast. Write it down and keep it in a log. 2) Take your medicines as prescribed 3) Eat low salt foods-Limit salt (sodium) to 2000 mg per day.  4) Stay as active as you can everyday 5) Limit all fluids for the day to less than 2 liters

## 2016-10-01 NOTE — Telephone Encounter (Signed)
Prior Authorization received from [employer] for [name]. Formulary preferred according to Diabetic supply from Dr. Valentina Lucks; Roma Schanz is preferred. Please change to preferred product. Patient has medicare and a faculty provider has to sign for supplies.   Leonard Barrow, RN

## 2016-10-01 NOTE — Progress Notes (Signed)
PCP: Dr. Juanito Doom Cardiology: Dr. Aundra Dubin Nephrology: Dr Marval Regal.   66 yo with history of CAD s/p CABG, PAD, chronic diastolic CHF, CKD stage IV, COPD on home oxygen, and HTN presents for cardiology followup.  He has had multiple admissions this year for diastolic CHF.  Last echo in 5/17 showed EF 60-65% with grade II diastolic dysfunction.  He also has COPD with PFTs showing severe obstruction.    Returns today for HF follow up. Weight remains stable, his volume status has much improved since switching to torsemide. He denies SOB with walking into clinic, no SOB with stairs. He denies orthopnea and PND. Eating a high salt diet, followed by paramedicine. He recently went to the ED for hyperglycemia. He says that his CBG meter is not working properly at home.   Labs (8/17): K 3.9, creatinine 2.33, HCT 28.5, LDL 46 Labs (11/17): hgb 9, K 4.4, creatinine 3, BNP 64 Labs 03/24/2016: K 3.5 Creatinine 2.48.  Labs 05/09/2016: K 4.3 Creatinine 3.13 Hgb 9.0   PMH: 1. Chronic diastolic CHF: Multiple recent admissions.  Echo (5/17) with EF 60-65%, grade II diastolic dysfunction, normal RV size and systolic function.  2. PAD: Right femoral PCI in 12/14. Peripheral arterial dopplers in 6/17 with occluded right SFA.  He follows with [facility].  3. HTN: Renal artery dopplers negative in 5/17.  4. Hypothyroidism 5. CKD stage IV: Follows with Dr. Marval Regal. 6. CAD: s/p CABG x 3 in 3/15.  7. OSA: Mild, not on CPAP.  8. COPD: PFTs (3/15) with FVC 77%, FEV1 47%, ratio 59%, TLC 55% => severe obstruction.  Prior smoker. He is on 2 L home O2.  9. 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 Hyperlipidemia 12. Anemia of renal disease   Social History   Social History  . Marital status: Single    Spouse name: N/A  . Number of children: N/A  . Years of education: N/A   Occupational History  . Not on file.   Social History Main Topics  . Smoking status: Former Smoker    Packs/day: 1.00    Years: 46.00    Types:  Cigarettes    Start date: 02/04/1967    Quit date: 02/03/2013  . Smokeless tobacco: Never Used     Comment: Doing well with quitting.  . Alcohol use No  . Drug use: No  . Sexual activity: Not Currently   Other Topics Concern  . Not on file   Social History Narrative   Lives with sister, [name] in [place].  Retired - former Sports coach.   Family History  Problem Relation Age of Onset  . Cancer Mother        lung cancer  . Hypertension Mother   . Cancer Sister        patient thinks it was uterine cancer  . Hypertension Sister   . Heart attack Sister   . Stroke Neg Hx    ROS: All systems reviewed and negative except as per HPI.   Current Outpatient Prescriptions  Medication Sig Dispense Refill  . allopurinol (ZYLOPRIM) 100 MG tablet Take 1 tablet (100 mg total) by mouth daily. 30 tablet 6  . amLODipine (NORVASC) 10 MG tablet Take 1 tablet (10 mg total) by mouth daily. 30 tablet 6  . aspirin 81 MG chewable tablet Chew 81 mg by mouth daily.    Marland Kitchen atorvastatin (LIPITOR) 40 MG tablet Take 40 mg by mouth daily.    . Blood Glucose Monitoring Suppl (ACCU-CHEK AVIVA PLUS) w/Device  KIT 1 kit by Does not apply route daily. 1 kit 3  . carvedilol (COREG) 25 MG tablet Take 2 tablets (50 mg total) by mouth 2 (two) times daily with a meal. 120 tablet 3  . cloNIDine (CATAPRES) 0.2 MG tablet Take 2 tablets (0.4 mg total) by mouth 2 (two) times daily. 120 tablet 3  . doxazosin (CARDURA) 8 MG tablet Take 1 tablet (8 mg total) by mouth 2 (two) times daily. 60 tablet 6  . ferrous sulfate (FERROUSUL) 325 (65 FE) MG tablet Take 1 tablet (325 mg total) by mouth 2 (two) times daily with a meal. 60 tablet 3  . glucose blood (ACCU-CHEK AVIVA) test strip Use as instructed 100 each 12  . hydrALAZINE (APRESOLINE) 100 MG tablet Take 100 mg by mouth every 8 (eight) hours.    . insulin aspart (NOVOLOG FLEXPEN) 100 UNIT/ML FlexPen Inject 15 Units into the skin 2 (two) times daily.     . Insulin Glargine  (LANTUS SOLOSTAR) 100 UNIT/ML Solostar Pen Inject 40 Units into the skin every morning.    . isosorbide mononitrate (IMDUR) 60 MG 24 hr tablet Take 60 mg by mouth daily.    . Lancets (ACCU-CHEK MULTICLIX) lancets Check sugar 6 x daily 200 each 3  . levothyroxine (SYNTHROID, LEVOTHROID) 50 MCG tablet Take 50 mcg by mouth daily before breakfast.    . oxyCODONE-acetaminophen (PERCOCET) 5-325 MG tablet Take 2 tablets by mouth every 4 (four) hours as needed. 20 tablet 0  . pantoprazole (PROTONIX) 40 MG tablet TAKE ONE (1) TABLET BY MOUTH EVERY DAY 90 tablet 0  . potassium chloride SA (K-DUR,KLOR-CON) 20 MEQ tablet Take 20 mEq by mouth daily.    Marland Kitchen torsemide (DEMADEX) 20 MG tablet Take 3 tablets (60 mg total) by mouth daily. May take an extra 20 mg Daily for weight gain above 212 lbs 90 tablet 6  . warfarin (COUMADIN) 5 MG tablet Take 2.5-5 mg by mouth See admin instructions. Take 2.5 mg (1/2 tablet) daily except take 5 mg (1 tablet) on Monday and Friday    . nitroGLYCERIN (NITROSTAT) 0.4 MG SL tablet Place 1 tablet (0.4 mg total) under the tongue every 5 (five) minutes as needed for chest pain. (Patient not taking: Reported on 10/01/2016) 30 tablet 12   No current facility-administered medications for this encounter.      BP 134/66   Pulse 65   Wt 201 lb (91.2 kg)   SpO2 98%   BMI 30.56 kg/m    Filed Weights   10/01/16 1032  Weight: 201 lb (91.2 kg)   General: Well appearing. No resp difficulty. HEENT: Normal Neck: Supple. JVP 5-6. Carotids 2+ bilat; no bruits. No thyromegaly or nodule noted. Cor: PMI nondisplaced. RRR, No M/G/R noted Lungs: CTAB, normal effort. Abdomen: Soft, non-tender, non-distended, no HSM. No bruits or masses. +BS  Extremities: No cyanosis, clubbing, rash, R ankle with 2+ edema. 1+ L pedal edema.  Neuro: Alert & orientedx3, cranial nerves grossly intact. moves all 4 extremities w/o difficulty. Affect pleasant     Assessment/Plan: 1. Chronic diastolic CHF: Echo  10/6281 EF 60-65%.  - NYHA II-III - Volume status stable on exam.  - Continue torsemide 60 mg a day.   2. HTN:  - Well controlled on current regimen.   3. CAD s.p CABG - No signs or symptoms of ischemia.      4. COPD: Gold stage III   - Stable, follows with Dr. Melvyn Novas.    5. CKD: Stage V.-  L AVF. Sees Dr Marval Regal.  - Recent BMET stable.   6. DVT - Diagnosed 08/30/16 - Continue warfarin for anticoagulation.   7.Gout - Continue allopurinol.   8. Uncontrolled DM - Previously seen by family medicine. Encouraged him to follow up.   Follow up in 2 months.   Arbutus Leas NP-C  10/01/2016

## 2016-10-01 NOTE — Telephone Encounter (Signed)
Received refill request for Miralax. Medication is not listed on med list.  Derl Barrow, RN

## 2016-10-01 NOTE — Progress Notes (Addendum)
Advanced Heart Failure Medication Review by a Pharmacist  Does the patient  feel that his/her medications are working for him/her?  yes  Has the patient been experiencing any side effects to the medications prescribed?  no  Does the patient measure his/her own blood pressure or blood glucose at home?  yes   Does the patient have any problems obtaining medications due to transportation or finances?   no  Understanding of regimen: fair Understanding of indications: fair Potential of compliance: fair Patient understands to avoid NSAIDs. Patient understands to avoid decongestants.  Issues to address at subsequent visits: None   Pharmacist comments:  Leonard [name] is a pleasant 66 yo M presenting with his wife and his medication bottles. He reports good compliance with his regimen. He has been having difficulty managing his diabetes and has been hospitalized multiple times for hyperglycemia. He states that he had been managed by Nicholas Lose, PharmD in [facility] but for some reason they have not been back to see him. I did notice that he has a f/u visit for DM management next Friday with FM clinic so will remind him of that appointment. No other medication-related questions or concerns for me at this time.   Leonard [name], PharmD, BCPS, CPP Clinical Pharmacist Pager: [phone] Phone: [phone] 10/01/2016 10:40 AM      Time with patient: 16 minutes Preparation and documentation time: 2 minutes Total time: 18 minutes

## 2016-10-02 MED ORDER — ONETOUCH VERIO FLEX SYSTEM W/DEVICE KIT: 1.0000 | PACK | Freq: Every day | 3 refills | Status: DC

## 2016-10-02 MED ORDER — GLUCOSE BLOOD VI STRP: ORAL_STRIP | 3 refills | Status: DC

## 2016-10-02 MED ORDER — ONETOUCH ULTRASOFT LANCETS MISC: 12 refills | Status: DC

## 2016-10-02 NOTE — Telephone Encounter (Signed)
Prescriptions sent

## 2016-10-02 NOTE — Addendum Note (Signed)
Addended by: [name] on: 10/02/2016 06:09 PM   Modules accepted: Orders

## 2016-10-03 ENCOUNTER — Telehealth: Payer: Self-pay | Admitting: Hematology and Oncology

## 2016-10-03 ENCOUNTER — Ambulatory Visit: Payer: Medicare Other | Admitting: Family Medicine

## 2016-10-03 NOTE — Telephone Encounter (Signed)
I have not prescribed this medication

## 2016-10-03 NOTE — Telephone Encounter (Signed)
Attempted to call and let him know that I scheduled fluids for him on 9/4. However, his phone said that it was disconnected.

## 2016-10-07 ENCOUNTER — Other Ambulatory Visit: Payer: Self-pay | Admitting: Family Medicine

## 2016-10-07 ENCOUNTER — Telehealth: Payer: Self-pay | Admitting: *Deleted

## 2016-10-07 ENCOUNTER — Inpatient Hospital Stay (HOSPITAL_COMMUNITY): Admission: RE | Admit: 2016-10-07 | Payer: Medicare Other | Source: Ambulatory Visit

## 2016-10-07 ENCOUNTER — Other Ambulatory Visit (HOSPITAL_COMMUNITY): Payer: Self-pay

## 2016-10-07 NOTE — Telephone Encounter (Signed)
[facility] called & stated that the pt was changing Pharmacies from [facility] to [facility] & they use a different manufacturer.  Advised that we are seeing the pt weekly & will note brand has been changed.

## 2016-10-07 NOTE — Progress Notes (Signed)
Paramedicine Encounter    Patient ID: Leonard [name], male    DOB: 09-27-1950, 66 y.o.   MRN: [id number]    Patient Care Team: [name], MD as PCP - General (Family Medicine) [name], MD as Consulting Physician (Nephrology)  Patient Active Problem List   Diagnosis Date Noted  . Long term (current) use of anticoagulants [Z79.01] 09/09/2016  . Chronic deep vein thrombosis (DVT) of femoral vein of left lower extremity ([place]) 09/03/2016  . Pleural effusion   . Oxygen desaturation   . Morbid obesity due to excess calories ([place]) 03/17/2016  . Anemia due to stage 4 chronic kidney disease treated with darbepoetin ([place]) 02/28/2016  . Fatigue 01/21/2016  . Diabetic retinopathy ([place]) 11/13/2015  . Dyspnea 08/28/2015  . CHF (congestive heart failure) ([place]) 08/27/2015  . Iron deficiency anemia 08/03/2015  . Claudication ([place])   . Type 2 diabetes mellitus with stage 3 chronic kidney disease, with long-term current use of insulin ([place])   . Secondary hypertension   . Hematuria 06/27/2015  . Skin lesion of face 02/22/2015  . 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 Deficiency anemia 01/15/2015  . CKD (chronic kidney disease) stage 4, GFR 15-29 ml/min (HCC) 01/15/2015  . Hypothyroidism 11/22/2014  . Acute on chronic diastolic congestive heart failure ([place]) 07/04/2014  . COPD GOLD III  02/16/2014  . History of tobacco use 08/23/2013  . 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 CAD (coronary artery disease) 04/06/2013  . PVD - hx of Rt SFA PTA and s/p Rt 4-5th toe amp Dec 2014 04/05/2013  . Essential hypertension 11/25/2012  . Mixed hyperlipidemia 07/17/2008    Current Outpatient Prescriptions:  .  allopurinol (ZYLOPRIM) 100 MG tablet, Take 1 tablet (100 mg total) by mouth daily., Disp: 30 tablet, Rfl: 6 .  amLODipine (NORVASC) 10 MG tablet, Take 1 tablet (10 mg total) by mouth daily., Disp: 30 tablet, Rfl: 6 .  aspirin 81 MG chewable tablet, Chew 81 mg by  mouth daily., Disp: , Rfl:  .  atorvastatin (LIPITOR) 40 MG tablet, Take 40 mg by mouth daily., Disp: , Rfl:  .  carvedilol (COREG) 25 MG tablet, Take 2 tablets (50 mg total) by mouth 2 (two) times daily with a meal., Disp: 120 tablet, Rfl: 3 .  cloNIDine (CATAPRES) 0.2 MG tablet, Take 2 tablets (0.4 mg total) by mouth 2 (two) times daily., Disp: 120 tablet, Rfl: 3 .  doxazosin (CARDURA) 8 MG tablet, Take 1 tablet (8 mg total) by mouth 2 (two) times daily., Disp: 60 tablet, Rfl: 6 .  ferrous sulfate (FERROUSUL) 325 (65 FE) MG tablet, Take 1 tablet (325 mg total) by mouth 2 (two) times daily with a meal., Disp: 60 tablet, Rfl: 3 .  hydrALAZINE (APRESOLINE) 100 MG tablet, Take 100 mg by mouth every 8 (eight) hours., Disp: , Rfl:  .  insulin aspart (NOVOLOG FLEXPEN) 100 UNIT/ML FlexPen, Inject 15 Units into the skin 2 (two) times daily. , Disp: , Rfl:  .  isosorbide mononitrate (IMDUR) 60 MG 24 hr tablet, Take 60 mg by mouth daily., Disp: , Rfl:  .  levothyroxine (SYNTHROID, LEVOTHROID) 50 MCG tablet, Take 50 mcg by mouth daily before breakfast., Disp: , Rfl:  .  pantoprazole (PROTONIX) 40 MG tablet, TAKE ONE (1) TABLET BY MOUTH EVERY DAY, Disp: 90 tablet, Rfl: 0 .  potassium chloride SA (K-DUR,KLOR-CON) 20 MEQ tablet, Take 20 mEq by mouth daily., Disp: , Rfl:  .  torsemide (DEMADEX) 20  MG tablet, Take 3 tablets (60 mg total) by mouth daily. May take an extra 20 mg Daily for weight gain above 212 lbs, Disp: 90 tablet, Rfl: 6 .  Blood Glucose Monitoring Suppl (ONETOUCH VERIO FLEX SYSTEM) w/Device KIT, 1 kit by Does not apply route daily., Disp: 1 kit, Rfl: 3 .  glucose blood (ONE TOUCH ULTRA TEST) test strip, Check sugar 6 x daily, Disp: 200 each, Rfl: 3 .  Insulin Glargine (LANTUS SOLOSTAR) 100 UNIT/ML Solostar Pen, Inject 40 Units into the skin every morning., Disp: , Rfl:  .  Lancets (ONETOUCH ULTRASOFT) lancets, Use as instructed, Disp: 100 each, Rfl: 12 .  nitroGLYCERIN (NITROSTAT) 0.4 MG SL  tablet, Place 1 tablet (0.4 mg total) under the tongue every 5 (five) minutes as needed for chest pain. (Patient not taking: Reported on 10/01/2016), Disp: 30 tablet, Rfl: 12 .  oxyCODONE-acetaminophen (PERCOCET) 5-325 MG tablet, Take 2 tablets by mouth every 4 (four) hours as needed., Disp: 20 tablet, Rfl: 0 .  warfarin (COUMADIN) 5 MG tablet, Take 2.5-5 mg by mouth See admin instructions. Take 2.5 mg (1/2 tablet) daily except take 5 mg (1 tablet) on Monday and Friday, Disp: , Rfl:  Allergies  Allergen Reactions  . No Known Allergies      Social History   Social History  . Marital status: Single    Spouse name: N/A  . Number of children: N/A  . Years of education: N/A   Occupational History  . Not on file.   Social History Main Topics  . Smoking status: Former Smoker    Packs/day: 1.00    Years: 46.00    Types: Cigarettes    Start date: 02/04/1967    Quit date: 02/03/2013  . Smokeless tobacco: Never Used     Comment: Doing well with quitting.  . Alcohol use No  . Drug use: No  . Sexual activity: Not Currently   Other Topics Concern  . Not on file   Social History Narrative   Lives with sister, [name] in [place].  Retired - former Sports coach.    Physical Exam  Pulmonary/Chest: No respiratory distress.  Abdominal: He exhibits no distension. There is no tenderness. There is no guarding.  Musculoskeletal: He exhibits edema.  Edema noted to both feet; edema in left foot and lower leg  Skin: Skin is warm and dry. He is not diaphoretic.        Future Appointments Date Time Provider [place]  10/09/2016 2:45 PM [facility] LAB 5 [facility] None  10/09/2016 3:15 PM [facility] FLUSH NURSE 2 [facility] None  10/10/2016 9:15 AM [facility]  10/10/2016 10:30 AM [name], MD [facility] [place]  10/23/2016 2:45 PM [facility] LAB 2 [facility] None  10/23/2016 3:15 PM [facility] INJ NURSE [facility] None   10/29/2016 10:00 AM Gardiner Barefoot, DPM [facility]  11/06/2016 2:45 PM [facility] LAB 5 [facility] None  11/06/2016 3:15 PM [facility] INJ NURSE [facility] None  11/20/2016 2:45 PM [facility] LAB 6 [facility] None  11/20/2016 3:15 PM Gorsuch, Ni, MD [facility] None  11/20/2016 3:45 PM [facility] INJ NURSE [facility] None  12/02/2016 10:00 AM [facility] PA/NP [facility] None  12/19/2016 2:00 PM [facility] VASC 3 [facility]  12/19/2016 2:45 PM Nickel, Sharmon Leyden, NP [facility]    ATF pt CAO x4 sitting in the recliner talking with his home healthcare aide.  Pt stated that his feet still hurts but he has no other complaints today.  Pt has taken  his meds for this week; he missed hydralozine afternoon dose twice. Pt denies sob, dizziness, headache and chest pain.  Pt ate food from the cookout his family had yesterday.  rx bottles verified and pill box refilled.    BP (!) 138/54 (BP Location: Right Arm, Patient Position: Sitting, Cuff Size: Normal)   Pulse 87   Resp 12   Wt 199 lb 8 oz (90.5 kg)   SpO2 98%   BMI 30.33 kg/m   cbg 343 **rx called in: Clonidine filled until mon even Ferrous sulfate until tues morn Warfarin  Warfarin 2.38m every day except 522mon Friday based on info from 10/01/16  Weight yesterday-200 Last visit weight-200    Mckynzi Cammon, EMT Paramedic 10/07/2016    ACTION: Home visit completed

## 2016-10-09 ENCOUNTER — Ambulatory Visit (HOSPITAL_BASED_OUTPATIENT_CLINIC_OR_DEPARTMENT_OTHER): Payer: Medicare Other

## 2016-10-09 ENCOUNTER — Other Ambulatory Visit (HOSPITAL_BASED_OUTPATIENT_CLINIC_OR_DEPARTMENT_OTHER): Payer: Medicare Other

## 2016-10-09 ENCOUNTER — Other Ambulatory Visit: Payer: Self-pay | Admitting: Hematology and Oncology

## 2016-10-09 ENCOUNTER — Ambulatory Visit (HOSPITAL_COMMUNITY)
Admission: RE | Admit: 2016-10-09 | Discharge: 2016-10-09 | Disposition: A | Discharge status: Home / Self Care | Payer: Medicare Other | Source: Ambulatory Visit | Attending: Hematology and Oncology | Admitting: Hematology and Oncology

## 2016-10-09 VITALS — BP 133/47 | HR 71 | Temp 98.4°F | Resp 14

## 2016-10-09 DIAGNOSIS — N184 Chronic kidney disease, stage 4 (severe): Secondary | ICD-10-CM

## 2016-10-09 DIAGNOSIS — D631 Anemia in chronic kidney disease: Secondary | ICD-10-CM | POA: Insufficient documentation

## 2016-10-09 DIAGNOSIS — N189 Chronic kidney disease, unspecified: Principal | ICD-10-CM

## 2016-10-09 LAB — CBC WITH DIFFERENTIAL/PLATELET
BASO%: 0.1 % (ref 0.0–2.0)
Basophils Absolute: 0 10*3/uL (ref 0.0–0.1)
EOS%: 2.5 % (ref 0.0–7.0)
Eosinophils Absolute: 0.2 10*3/uL (ref 0.0–0.5)
HCT: 24.8 % — ABNORMAL LOW (ref 38.4–49.9)
HGB: 7.7 g/dL — ABNORMAL LOW (ref 13.0–17.1)
LYMPH#: 0.8 10*3/uL — AB (ref 0.9–3.3)
LYMPH%: 11.2 % — ABNORMAL LOW (ref 14.0–49.0)
MCH: 22.3 pg — AB (ref 27.2–33.4)
MCHC: 31 g/dL — AB (ref 32.0–36.0)
MCV: 71.9 fL — ABNORMAL LOW (ref 79.3–98.0)
MONO#: 0.7 10*3/uL (ref 0.1–0.9)
MONO%: 9.4 % (ref 0.0–14.0)
NEUT#: 5.3 10*3/uL (ref 1.5–6.5)
NEUT%: 76.8 % — AB (ref 39.0–75.0)
Platelets: 250 10*3/uL (ref 140–400)
RBC: 3.45 10*6/uL — ABNORMAL LOW (ref 4.20–5.82)
RDW: 19.8 % — ABNORMAL HIGH (ref 11.0–14.6)
WBC: 6.9 10*3/uL (ref 4.0–10.3)

## 2016-10-09 LAB — PREPARE RBC (CROSSMATCH)

## 2016-10-09 MED ORDER — DARBEPOETIN ALFA 500 MCG/ML IJ SOSY
500.0000 ug | PREFILLED_SYRINGE | Freq: Once | INTRAMUSCULAR | Status: AC
  Administered 2016-10-09: 500 ug via SUBCUTANEOUS
  Filled 2016-10-09: qty 1

## 2016-10-09 NOTE — Progress Notes (Signed)
Subjective:    Patient ID: Leonard [name] , male   DOB: 1950-02-05 , 66 y.o..   MRN: [id number]  HPI  OAKLAN PERSONS is here for   1. Chronic Diabetes  Disease Monitoring  Blood Sugar Ranges: Has not been checking at home because no meter or test strips. His sister try to pick up the medications from the pharmacy but they said they neededan extra paper filled out for Medicare  Polyuria: no   Visual problems: no   Last hemoglobin A1C:  Lab Results  Component Value Date   HGBA1C 11.0 10/10/2016    Medication Compliance: no, intermittently compliant but frequently misses evening novolog dose   Medication Side Effects  Hypoglycemia: no   Preventitive Health Care  Eye Exam: Overdue  Foot Exam: up to date   Diet pattern: trys to follow a diabetic diet   Exercise: Does not exercise  Review of Systems: Per HPI.   Past Medical History: Patient Active Problem List   Diagnosis Date Noted  . Long term (current) use of anticoagulants [Z79.01] 09/09/2016  . Chronic deep vein thrombosis (DVT) of femoral vein of left lower extremity ([place]) 09/03/2016  . Pleural effusion   . Oxygen desaturation   . Morbid obesity due to excess calories ([place]) 03/17/2016  . Anemia due to stage 4 chronic kidney disease treated with darbepoetin ([place]) 02/28/2016  . Fatigue associated with anemia 01/21/2016  . Diabetic retinopathy ([place]) 11/13/2015  . Dyspnea 08/28/2015  . CHF (congestive heart failure) ([place]) 08/27/2015  . Iron deficiency anemia 08/03/2015  . Claudication ([place])   . Type 2 diabetes mellitus with stage 3 chronic kidney disease, with long-term current use of insulin ([place])   . Secondary hypertension   . Hematuria 06/27/2015  . Skin lesion of face 02/22/2015  . 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 Deficiency anemia 01/15/2015  . CKD (chronic kidney disease) stage 4, GFR 15-29 ml/min (HCC) 01/15/2015  . Hypothyroidism 11/22/2014  . Acute on chronic  diastolic congestive heart failure ([place]) 07/04/2014  . COPD GOLD III  02/16/2014  . History of tobacco use 08/23/2013  . 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 CAD (coronary artery disease) 04/06/2013  . PVD - hx of Rt SFA PTA and s/p Rt 4-5th toe amp Dec 2014 04/05/2013  . Essential hypertension 11/25/2012  . Mixed hyperlipidemia 07/17/2008    Medications: reviewed   Social Hx:  reports that he quit smoking about 3 years ago. His smoking use included Cigarettes. He started smoking about 49 years ago. He has a 46.00 pack-year smoking history. He has never used smokeless tobacco.   Objective:   Pulse 68   Temp 98.2 F (36.8 C)   Wt 200 lb (90.7 kg)   SpO2 100%   BMI 30.41 kg/m  Physical Exam  Gen: NAD, cooperative with exam, sleepy throughout exam Cardiac: Regular rate and rhythm, 3+ pitting edema from mid shin down on left leg Respiratory:  non-labored breathing Psych: good insight, normal mood and affect  Assessment & Plan:  Type 2 diabetes mellitus with stage 3 chronic kidney disease, with long-term current use of insulin (HCC) Continues to be uncontrolled. Hemoglobin A1c 11 today. Noncompliant with NovoLog intermittently. Has not been checking glucose himself at home due to no meter or supplies. Does get his glucose checked by an EMT who visits the house; they seem to range between the 250s-300s.  -Increase Lantus to 50 units every morning -Continue 15 units NovoLog with  2 biggest meals of the day -Sent in another prescription for diabetic glucometer and supplies -Referral placed for ophthalmology for diabetic eye exam -Follow-up in 2 weeks  Fatigue associated with anemia Patient sleeping intermittently today on exam. He has been tired when seen previously as well. This is likely multifactorial with multiple comorbidities including CHF, CKD, uncontrolled diabetes, anemia (hemoglobin 7.7 yesterday), hypothyroidism. Patient states he is scheduled to have a blood transfusion today.    -Follow-up with patient in 2 weeks -Can check TSH at that time and repeat CBC -Has not had a colonoscopy, will need this for a stool card  Orders Placed This Encounter  Procedures  . Ambulatory referral to Ophthalmology    Referral Priority:   Routine    Referral Type:   Consultation    Referral Reason:   Specialty Services Required    Requested Specialty:   Ophthalmology    Number of Visits Requested:   1  . HgB A1c   Meds ordered this encounter  Medications  . Insulin Glargine (LANTUS SOLOSTAR) 100 UNIT/ML Solostar Pen    Sig: Inject 50 Units into the skin every morning.    Dispense:  15 mL    Refill:  3    Smitty Cords, MD [place], PGY-3

## 2016-10-10 ENCOUNTER — Ambulatory Visit (INDEPENDENT_AMBULATORY_CARE_PROVIDER_SITE_OTHER): Payer: Medicare Other | Admitting: Pharmacist

## 2016-10-10 ENCOUNTER — Ambulatory Visit (HOSPITAL_BASED_OUTPATIENT_CLINIC_OR_DEPARTMENT_OTHER): Payer: Medicare Other

## 2016-10-10 ENCOUNTER — Encounter: Payer: Self-pay | Admitting: Family Medicine

## 2016-10-10 ENCOUNTER — Other Ambulatory Visit: Payer: Self-pay | Admitting: Family Medicine

## 2016-10-10 ENCOUNTER — Ambulatory Visit (INDEPENDENT_AMBULATORY_CARE_PROVIDER_SITE_OTHER): Payer: Medicare Other | Admitting: Family Medicine

## 2016-10-10 VITALS — HR 68 | Temp 98.2°F | Wt 200.0 lb

## 2016-10-10 DIAGNOSIS — E1122 Type 2 diabetes mellitus with diabetic chronic kidney disease: Secondary | ICD-10-CM

## 2016-10-10 DIAGNOSIS — N183 Chronic kidney disease, stage 3 (moderate): Secondary | ICD-10-CM

## 2016-10-10 DIAGNOSIS — D649 Anemia, unspecified: Secondary | ICD-10-CM | POA: Diagnosis not present

## 2016-10-10 DIAGNOSIS — Z794 Long term (current) use of insulin: Secondary | ICD-10-CM

## 2016-10-10 DIAGNOSIS — N184 Chronic kidney disease, stage 4 (severe): Secondary | ICD-10-CM

## 2016-10-10 DIAGNOSIS — I82512 Chronic embolism and thrombosis of left femoral vein: Secondary | ICD-10-CM | POA: Diagnosis not present

## 2016-10-10 DIAGNOSIS — Z7901 Long term (current) use of anticoagulants: Secondary | ICD-10-CM | POA: Diagnosis not present

## 2016-10-10 DIAGNOSIS — I779 Disorder of arteries and arterioles, unspecified: Secondary | ICD-10-CM

## 2016-10-10 DIAGNOSIS — D631 Anemia in chronic kidney disease: Secondary | ICD-10-CM | POA: Diagnosis not present

## 2016-10-10 LAB — POCT GLYCOSYLATED HEMOGLOBIN (HGB A1C): Hemoglobin A1C: 11

## 2016-10-10 LAB — POCT INR: INR: 3.1

## 2016-10-10 MED ORDER — INSULIN GLARGINE 100 UNIT/ML SOLOSTAR PEN: 50.0000 [IU] | PEN_INJECTOR | SUBCUTANEOUS | 3 refills | Status: DC

## 2016-10-10 MED ORDER — ACETAMINOPHEN 325 MG PO TABS
ORAL_TABLET | ORAL | Status: AC
  Filled 2016-10-10: qty 2

## 2016-10-10 MED ORDER — ONETOUCH VERIO FLEX SYSTEM W/DEVICE KIT: 1.0000 | PACK | Freq: Every day | 3 refills | Status: AC

## 2016-10-10 MED ORDER — DIPHENHYDRAMINE HCL 25 MG PO CAPS
ORAL_CAPSULE | ORAL | Status: AC
  Filled 2016-10-10: qty 1

## 2016-10-10 MED ORDER — GLUCOSE BLOOD VI STRP: ORAL_STRIP | 3 refills | Status: DC

## 2016-10-10 MED ORDER — ONETOUCH ULTRASOFT LANCETS MISC: 12 refills | Status: DC

## 2016-10-10 MED ORDER — SODIUM CHLORIDE 0.9 % IV SOLN
250.0000 mL | Freq: Once | INTRAVENOUS | Status: AC
  Administered 2016-10-10: 250 mL via INTRAVENOUS

## 2016-10-10 MED ORDER — DIPHENHYDRAMINE HCL 25 MG PO CAPS
25.0000 mg | ORAL_CAPSULE | Freq: Once | ORAL | Status: AC
  Administered 2016-10-10: 25 mg via ORAL

## 2016-10-10 MED ORDER — ACETAMINOPHEN 325 MG PO TABS
650.0000 mg | ORAL_TABLET | Freq: Once | ORAL | Status: AC
  Administered 2016-10-10: 650 mg via ORAL

## 2016-10-10 NOTE — Assessment & Plan Note (Addendum)
Continues to be uncontrolled. Hemoglobin A1c 11 today. Noncompliant with NovoLog intermittently. Has not been checking glucose himself at home due to no meter or supplies. Does get his glucose checked by an EMT who visits the house; they seem to range between the 250s-300s.  -Increase Lantus to 50 units every morning -Continue 15 units NovoLog with 2 biggest meals of the day -Sent in another prescription for diabetic glucometer and supplies -Referral placed for ophthalmology for diabetic eye exam -Follow-up in 2 weeks

## 2016-10-10 NOTE — Patient Instructions (Signed)
Thank you for coming in today, it was so nice to see you! Today we talked about:    Diabetes: We increased your Lantus 50 units every morning. Continue 15 units novolog twice a day with your biggest meals  Check your glucose every morning  Please follow up in 2 weeks. You can schedule this appointment at the front desk before you leave or call the clinic.  Bring in all your medications or supplements to each appointment for review.   If we ordered any tests today, you will be notified via telephone of any abnormalities. If everything is normal you will get a letter in the mail.   If you have any questions or concerns, please do not hesitate to call the office at [phone]. You can also message me directly via MyChart.   Sincerely,  Smitty Cords, MD

## 2016-10-10 NOTE — Patient Instructions (Signed)

## 2016-10-10 NOTE — Assessment & Plan Note (Signed)
Patient sleeping intermittently today on exam. He has been tired when seen previously as well. This is likely multifactorial with multiple comorbidities including CHF, CKD, uncontrolled diabetes, anemia (hemoglobin 7.7 yesterday), hypothyroidism. Patient states he is scheduled to have a blood transfusion today.  -Follow-up with patient in 2 weeks -Can check TSH at that time and repeat CBC -Has not had a colonoscopy, will need this for a stool card

## 2016-10-11 LAB — BPAM RBC
BLOOD PRODUCT EXPIRATION DATE: 201809252359
Blood Product Expiration Date: 201809132359
Blood Product Expiration Date: 201810042359
ISSUE DATE / TIME: 201809071329
ISSUE DATE / TIME: 201809071329
UNIT TYPE AND RH: 1700
UNIT TYPE AND RH: 1700
Unit Type and Rh: 1700

## 2016-10-11 LAB — TYPE AND SCREEN
ABO/RH(D): B NEG
Antibody Screen: NEGATIVE
UNIT DIVISION: 0
UNIT DIVISION: 0
Unit division: 0

## 2016-10-14 ENCOUNTER — Other Ambulatory Visit (HOSPITAL_COMMUNITY): Payer: Self-pay
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
## 2016-10-15 ENCOUNTER — Telehealth: Payer: Self-pay | Admitting: *Deleted

## 2016-10-15 MED ORDER — GLUCOSE BLOOD VI STRP: ORAL_STRIP | 12 refills | Status: AC

## 2016-10-15 MED ORDER — ONETOUCH DELICA LANCETS 33G MISC: 1.0000 | Freq: Three times a day (TID) | 12 refills | Status: AC

## 2016-10-15 NOTE — Telephone Encounter (Signed)
Received fax from [facility] stating the wrong test strips and lancet were sent in. Patient need Onetouch Verio strips and lancet. Correct lancets and strips sent in to the pharmacy.  Derl Barrow, RN

## 2016-10-21 ENCOUNTER — Other Ambulatory Visit (HOSPITAL_COMMUNITY): Payer: Self-pay

## 2016-10-21 ENCOUNTER — Ambulatory Visit (INDEPENDENT_AMBULATORY_CARE_PROVIDER_SITE_OTHER): Payer: Medicare Other | Admitting: *Deleted

## 2016-10-21 DIAGNOSIS — Z7901 Long term (current) use of anticoagulants: Secondary | ICD-10-CM | POA: Diagnosis not present

## 2016-10-21 DIAGNOSIS — I82512 Chronic embolism and thrombosis of left femoral vein: Secondary | ICD-10-CM

## 2016-10-21 LAB — POCT INR: INR: 1.7

## 2016-10-21 NOTE — [place] Notes (Signed)
Paramedicine Encounter    Patient ID: Leonard [name], male    DOB: 01-03-1951, 66 y.o.   MRN: [id number]    Patient Care Team: [name], MD as PCP - General (Family Medicine) [name], MD as Consulting Physician (Nephrology)  Patient Active Problem List   Diagnosis Date Noted  . Long term (current) use of anticoagulants [Z79.01] 09/09/2016  . Chronic deep vein thrombosis (DVT) of femoral vein of left lower extremity ([place]) 09/03/2016  . Pleural effusion   . Oxygen desaturation   . Morbid obesity due to excess calories ([place]) 03/17/2016  . Anemia due to stage 4 chronic kidney disease treated with darbepoetin ([place]) 02/28/2016  . Fatigue associated with anemia 01/21/2016  . Diabetic retinopathy ([place]) 11/13/2015  . Dyspnea 08/28/2015  . CHF (congestive heart failure) ([place]) 08/27/2015  . Iron deficiency anemia 08/03/2015  . Claudication ([place])   . Type 2 diabetes mellitus with stage 3 chronic kidney disease, with long-term current use of insulin ([place])   . Secondary hypertension   . Hematuria 06/27/2015  . Skin lesion of face 02/22/2015  . 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 Deficiency anemia 01/15/2015  . CKD (chronic kidney disease) stage 4, GFR 15-29 ml/min (HCC) 01/15/2015  . Hypothyroidism 11/22/2014  . Acute on chronic diastolic congestive heart failure ([place]) 07/04/2014  . COPD GOLD III  02/16/2014  . History of tobacco use 08/23/2013  . 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 CAD (coronary artery disease) 04/06/2013  . PVD - hx of Rt SFA PTA and s/p Rt 4-5th toe amp Dec 2014 04/05/2013  . Essential hypertension 11/25/2012  . Mixed hyperlipidemia 07/17/2008    Current Outpatient Prescriptions:  .  allopurinol (ZYLOPRIM) 100 MG tablet, Take 1 tablet (100 mg total) by mouth daily., Disp: 30 tablet, Rfl: 6 .  amLODipine (NORVASC) 10 MG tablet, Take 1 tablet (10 mg total) by mouth daily., Disp: 30 tablet, Rfl: 6 .  aspirin 81 MG chewable  tablet, Chew 81 mg by mouth daily., Disp: , Rfl:  .  atorvastatin (LIPITOR) 40 MG tablet, Take 40 mg by mouth daily., Disp: , Rfl:  .  carvedilol (COREG) 25 MG tablet, Take 2 tablets (50 mg total) by mouth 2 (two) times daily with a meal., Disp: 120 tablet, Rfl: 3 .  cloNIDine (CATAPRES) 0.2 MG tablet, Take 2 tablets (0.4 mg total) by mouth 2 (two) times daily., Disp: 120 tablet, Rfl: 3 .  doxazosin (CARDURA) 8 MG tablet, Take 1 tablet (8 mg total) by mouth 2 (two) times daily., Disp: 60 tablet, Rfl: 6 .  ferrous sulfate 325 (65 FE) MG tablet, TAKE ONE (1) TABLET BY MOUTH TWO (2) TIMES DAILY WITH A MEAL, Disp: 60 tablet, Rfl: 3 .  glucose blood (ONETOUCH VERIO) test strip, Use as instructed to test three times daily. ICD-10 code: E11.22, Disp: 100 each, Rfl: 12 .  hydrALAZINE (APRESOLINE) 100 MG tablet, TAKE ONE TABLET BY MOUTH EVERY EIGHT HOURS, Disp: 90 tablet, Rfl: 0 .  insulin aspart (NOVOLOG FLEXPEN) 100 UNIT/ML FlexPen, Inject 15 Units into the skin 2 (two) times daily. , Disp: , Rfl:  .  Insulin Glargine (LANTUS SOLOSTAR) 100 UNIT/ML Solostar Pen, Inject 50 Units into the skin every morning., Disp: 15 mL, Rfl: 3 .  isosorbide mononitrate (IMDUR) 60 MG 24 hr tablet, Take 60 mg by mouth daily., Disp: , Rfl:  .  levothyroxine (SYNTHROID, LEVOTHROID) 50 MCG tablet, Take 50 mcg by mouth daily before breakfast., Disp: ,  Rfl:  .  pantoprazole (PROTONIX) 40 MG tablet, TAKE ONE (1) TABLET BY MOUTH EVERY DAY, Disp: 90 tablet, Rfl: 0 .  potassium chloride SA (K-DUR,KLOR-CON) 20 MEQ tablet, Take 20 mEq by mouth daily., Disp: , Rfl:  .  torsemide (DEMADEX) 20 MG tablet, Take 3 tablets (60 mg total) by mouth daily. May take an extra 20 mg Daily for weight gain above 212 lbs, Disp: 90 tablet, Rfl: 6 .  warfarin (COUMADIN) 5 MG tablet, Take 2.5-5 mg by mouth See admin instructions. Take 2.5 mg (1/2 tablet) daily except take 5 mg (1 tablet) on Monday and Friday, Disp: , Rfl:  .  Blood Glucose Monitoring Suppl  (ONETOUCH VERIO FLEX SYSTEM) w/Device KIT, 1 kit by Does not apply route daily., Disp: 1 kit, Rfl: 3 .  Lancets (ONETOUCH ULTRASOFT) lancets, Use as instructed, Disp: 100 each, Rfl: 12 .  nitroGLYCERIN (NITROSTAT) 0.4 MG SL tablet, Place 1 tablet (0.4 mg total) under the tongue every 5 (five) minutes as needed for chest pain. (Patient not taking: Reported on 10/01/2016), Disp: 30 tablet, Rfl: 12 .  ONETOUCH DELICA LANCETS 29U MISC, 1 each by Does not apply route 3 (three) times daily. ICD-10 code: E11.22, Disp: 100 each, Rfl: 12 .  oxyCODONE-acetaminophen (PERCOCET) 5-325 MG tablet, Take 2 tablets by mouth every 4 (four) hours as needed., Disp: 20 tablet, Rfl: 0 Allergies  Allergen Reactions  . No Known Allergies      Social History   Social History  . Marital status: Single    Spouse name: N/A  . Number of children: N/A  . Years of education: N/A   Occupational History  . Not on file.   Social History Main Topics  . Smoking status: Former Smoker    Packs/day: 1.00    Years: 46.00    Types: Cigarettes    Start date: 02/04/1967    Quit date: 02/03/2013  . Smokeless tobacco: Never Used     Comment: Doing well with quitting.  . Alcohol use No  . Drug use: No  . Sexual activity: Not Currently   Other Topics Concern  . Not on file   Social History Narrative   Lives with sister, [name] in [place].  Retired - former Sports coach.    Physical Exam  Pulmonary/Chest: No respiratory distress. He has no wheezes. He has no rales.  Abdominal: He exhibits no distension. There is no tenderness. There is no guarding.  Musculoskeletal: He exhibits edema.  Edema in left leg only/hx of same  Skin: Skin is warm and dry. He is not diaphoretic.        Future Appointments Date Time Provider [place]  10/23/2016 2:45 PM [facility] LAB 2 [facility] None  10/23/2016 3:15 PM [facility] INJ NURSE [facility] None  10/24/2016 10:50 AM [name], MD [facility]   10/28/2016 11:20 AM [facility]  10/29/2016 10:00 AM Gardiner Barefoot, DPM [facility]  11/06/2016 2:45 PM [facility] LAB 5 [facility] None  11/06/2016 3:15 PM [facility] INJ NURSE [facility] None  11/20/2016 2:45 PM [facility] LAB 6 [facility] None  11/20/2016 3:15 PM Gorsuch, Ni, MD [facility] None  11/20/2016 3:45 PM [facility] INJ NURSE [facility] None  12/02/2016 10:00 AM [facility] PA/NP [facility] None  12/19/2016 2:00 PM [facility] VASC 3 [facility]  12/19/2016 2:45 PM Nickel, Sharmon Leyden, NP [facility]    ATF pt CAO x4 sitting in his recliner sleep. Pt has missed several evening and almost all afternoon meds  this past week. Pt doesn't have a reason why he missed his meds.  Pt denies sob, dizziness, and headache.  Pt is still eating foods high in sodium.  Pt was re-education own his diet and taking his meds.  rx bottles verified and pill box verified.    BP (!) 146/52   Pulse 76   Wt 200 lb (90.7 kg)   SpO2 96%   BMI 30.41 kg/m   CBG 301 **waefarin 11m today and fri; 2.5 sun, mon, wed, thurs, sat  *rx called in:  allopurinnol ( filled untilnext tues) Atorvastatin Carvedilol lantus  Weight yesterday-cant remember Last visit weight-199    Jeanny Rymer, EMT Paramedic 10/21/2016    ACTION: Home visit completed

## 2016-10-22 DIAGNOSIS — F17201 Nicotine dependence, unspecified, in remission: Secondary | ICD-10-CM | POA: Diagnosis not present

## 2016-10-22 DIAGNOSIS — N179 Acute kidney failure, unspecified: Secondary | ICD-10-CM | POA: Diagnosis not present

## 2016-10-22 DIAGNOSIS — D631 Anemia in chronic kidney disease: Secondary | ICD-10-CM | POA: Diagnosis not present

## 2016-10-22 DIAGNOSIS — I5030 Unspecified diastolic (congestive) heart failure: Secondary | ICD-10-CM | POA: Diagnosis not present

## 2016-10-22 DIAGNOSIS — Z23 Encounter for immunization: Secondary | ICD-10-CM | POA: Diagnosis not present

## 2016-10-22 DIAGNOSIS — N2581 Secondary hyperparathyroidism of renal origin: Secondary | ICD-10-CM | POA: Diagnosis not present

## 2016-10-22 DIAGNOSIS — J449 Chronic obstructive pulmonary disease, unspecified: Secondary | ICD-10-CM | POA: Diagnosis not present

## 2016-10-22 DIAGNOSIS — I129 Hypertensive chronic kidney disease with stage 1 through stage 4 chronic kidney disease, or unspecified chronic kidney disease: Secondary | ICD-10-CM | POA: Diagnosis not present

## 2016-10-22 DIAGNOSIS — I2510 Atherosclerotic heart disease of native coronary artery without angina pectoris: Secondary | ICD-10-CM | POA: Diagnosis not present

## 2016-10-22 DIAGNOSIS — I739 Peripheral vascular disease, unspecified: Secondary | ICD-10-CM | POA: Diagnosis not present

## 2016-10-22 DIAGNOSIS — E119 Type 2 diabetes mellitus without complications: Secondary | ICD-10-CM | POA: Diagnosis not present

## 2016-10-22 DIAGNOSIS — N184 Chronic kidney disease, stage 4 (severe): Secondary | ICD-10-CM | POA: Diagnosis not present

## 2016-10-23 ENCOUNTER — Ambulatory Visit (HOSPITAL_BASED_OUTPATIENT_CLINIC_OR_DEPARTMENT_OTHER): Payer: Medicare Other

## 2016-10-23 ENCOUNTER — Other Ambulatory Visit (HOSPITAL_BASED_OUTPATIENT_CLINIC_OR_DEPARTMENT_OTHER): Payer: Medicare Other

## 2016-10-23 VITALS — BP 119/63 | HR 72 | Temp 98.3°F | Resp 16

## 2016-10-23 DIAGNOSIS — N184 Chronic kidney disease, stage 4 (severe): Secondary | ICD-10-CM | POA: Diagnosis present

## 2016-10-23 DIAGNOSIS — D631 Anemia in chronic kidney disease: Secondary | ICD-10-CM

## 2016-10-23 LAB — CBC WITH DIFFERENTIAL/PLATELET
BASO%: 0.5 % (ref 0.0–2.0)
Basophils Absolute: 0.1 10*3/uL (ref 0.0–0.1)
EOS ABS: 0.1 10*3/uL (ref 0.0–0.5)
EOS%: 1.1 % (ref 0.0–7.0)
HCT: 26 % — ABNORMAL LOW (ref 38.4–49.9)
HGB: 8.3 g/dL — ABNORMAL LOW (ref 13.0–17.1)
LYMPH%: 10.5 % — AB (ref 14.0–49.0)
MCH: 23.2 pg — ABNORMAL LOW (ref 27.2–33.4)
MCHC: 31.9 g/dL — AB (ref 32.0–36.0)
MCV: 72.6 fL — AB (ref 79.3–98.0)
MONO#: 0.7 10*3/uL (ref 0.1–0.9)
MONO%: 7.6 % (ref 0.0–14.0)
NEUT#: 7.5 10*3/uL — ABNORMAL HIGH (ref 1.5–6.5)
NEUT%: 80.3 % — AB (ref 39.0–75.0)
PLATELETS: 263 10*3/uL (ref 140–400)
RBC: 3.58 10*6/uL — AB (ref 4.20–5.82)
RDW: 24.4 % — ABNORMAL HIGH (ref 11.0–14.6)
WBC: 9.3 10*3/uL (ref 4.0–10.3)
lymph#: 1 10*3/uL (ref 0.9–3.3)

## 2016-10-23 MED ORDER — DARBEPOETIN ALFA 500 MCG/ML IJ SOSY
500.0000 ug | PREFILLED_SYRINGE | Freq: Once | INTRAMUSCULAR | Status: AC
  Administered 2016-10-23: 500 ug via SUBCUTANEOUS
  Filled 2016-10-23: qty 1

## 2016-10-24 ENCOUNTER — Ambulatory Visit (INDEPENDENT_AMBULATORY_CARE_PROVIDER_SITE_OTHER): Payer: Medicare Other | Admitting: Family Medicine

## 2016-10-24 VITALS — BP 120/75 | HR 73 | Temp 97.9°F | Ht 68.0 in | Wt 198.8 lb

## 2016-10-24 DIAGNOSIS — E1122 Type 2 diabetes mellitus with diabetic chronic kidney disease: Secondary | ICD-10-CM

## 2016-10-24 DIAGNOSIS — I779 Disorder of arteries and arterioles, unspecified: Secondary | ICD-10-CM | POA: Diagnosis not present

## 2016-10-24 DIAGNOSIS — Z794 Long term (current) use of insulin: Secondary | ICD-10-CM

## 2016-10-24 DIAGNOSIS — M79672 Pain in left foot: Secondary | ICD-10-CM

## 2016-10-24 DIAGNOSIS — E118 Type 2 diabetes mellitus with unspecified complications: Secondary | ICD-10-CM | POA: Diagnosis present

## 2016-10-24 DIAGNOSIS — N183 Chronic kidney disease, stage 3 (moderate): Secondary | ICD-10-CM

## 2016-10-24 LAB — IRON AND TIBC
%SAT: 6 % — ABNORMAL LOW (ref 20–55)
Iron: 14 ug/dL — ABNORMAL LOW (ref 42–163)
TIBC: 221 ug/dL (ref 202–409)
UIBC: 207 ug/dL (ref 117–376)

## 2016-10-24 LAB — FERRITIN: Ferritin: 338 ng/ml — ABNORMAL HIGH (ref 22–316)

## 2016-10-24 MED ORDER — TRAMADOL HCL 50 MG PO TABS: 50.0000 mg | ORAL_TABLET | Freq: Three times a day (TID) | ORAL | 0 refills | Status: DC | PRN

## 2016-10-24 NOTE — Patient Instructions (Signed)
Thank you for coming in today, it was so nice to see you! Today we talked about:   Diabetes: Continue checking your glucose every morning and writing your numbers down. Take your insulin as prescribed  Please follow up in 2 weeks for diabetes. You can schedule this appointment at the front desk before you leave or call the clinic.  Bring in all your medications or supplements to each appointment for review.   If we ordered any tests today, you will be notified via telephone of any abnormalities. If everything is normal you will get a letter in the mail.   If you have any questions or concerns, please do not hesitate to call the office at [phone]. You can also message me directly via MyChart.   Sincerely,  Smitty Cords, MD

## 2016-10-24 NOTE — [place] Notes (Signed)
Subjective:    Patient ID: Leonard [name] , male   DOB: May 10, 1950 , 66 y.o..   MRN: [id number]  HPI  Leonard [name] is here for  Chief Complaint  Patient presents with  . Diabetes    1.Chronic Diabetes  Disease Monitoring  Blood Sugar Ranges: 300's in the mornings. One time it was 140. Did not bring in log of glucose today  Polyuria: no   Visual problems: no   Last hemoglobin A1C:  Lab Results  Component Value Date   HGBA1C 11.0 10/10/2016   Medication Compliance: no  Medication Side Effects  Hypoglycemia: no   Preventitive Health Care  Eye Exam: overdue   Foot Exam: up to date  Diet pattern: tries to comply   Exercise: none  2. Left foot pain: Patient notes that he continues to have pain in his left foot where his gout was. The pain is better than before but still present. Worse with ambulation, better at rest. Denies any erythema or skin changes in the area.   Review of Systems: Per HPI.   Past Medical History: Patient Active Problem List   Diagnosis Date Noted  . Left foot pain 10/29/2016  . Long term (current) use of anticoagulants [Z79.01] 09/09/2016  . Chronic deep vein thrombosis (DVT) of femoral vein of left lower extremity ([place]) 09/03/2016  . Pleural effusion   . Oxygen desaturation   . Morbid obesity due to excess calories ([place]) 03/17/2016  . Anemia due to stage 4 chronic kidney disease treated with darbepoetin ([place]) 02/28/2016  . Fatigue associated with anemia 01/21/2016  . Diabetic retinopathy ([place]) 11/13/2015  . Dyspnea 08/28/2015  . CHF (congestive heart failure) ([place]) 08/27/2015  . Iron deficiency anemia 08/03/2015  . Claudication ([place])   . Type 2 diabetes mellitus with stage 3 chronic kidney disease, with long-term current use of insulin ([place])   . Secondary hypertension   . Hematuria 06/27/2015  . Skin lesion of face 02/22/2015  . 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 Deficiency anemia 01/15/2015  . CKD  (chronic kidney disease) stage 4, GFR 15-29 ml/min (HCC) 01/15/2015  . Hypothyroidism 11/22/2014  . Acute on chronic diastolic congestive heart failure ([place]) 07/04/2014  . COPD GOLD III  02/16/2014  . History of tobacco use 08/23/2013  . 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 CAD (coronary artery disease) 04/06/2013  . PVD - hx of Rt SFA PTA and s/p Rt 4-5th toe amp Dec 2014 04/05/2013  . Essential hypertension 11/25/2012  . Mixed hyperlipidemia 07/17/2008    Medications: reviewed   Social Hx:  reports that he quit smoking about 3 years ago. His smoking use included Cigarettes. He started smoking about 49 years ago. He has a 46.00 pack-year smoking history. He has never used smokeless tobacco.   Objective:   BP 120/75 (BP Location: Right Arm, Patient Position: Sitting, Cuff Size: Normal)   Pulse 73   Temp 97.9 F (36.6 C) (Oral)   Ht 5\' 8"  (1.727 m)   Wt 198 lb 12.8 oz (90.2 kg)   SpO2 98%   BMI 30.23 kg/m  Physical Exam  Gen: NAD, alert, cooperative with exam, well-appearing Cardiac: Regular rate and rhythm, normal S1/S2, 1+ ankle edema bilaterally, capillary refill brisk  Respiratory: Clear to auscultation bilaterally, no wheezes, non-labored breathing Skin: no rashes, normal turgor. Hyperkeratotic lesion on ball of left foot, no skin breakdown Neurological: no gross deficits.  Psych: good insight, normal mood and affect  Assessment & Plan:  Type 2 diabetes mellitus with stage 3 chronic kidney disease, with long-term current use of insulin (HCC) Uncontrolled per last A1C of 11. Patient has good knowledge of his diabetes but continues not to check his glucose which makes it difficult to titrate his insulin. He has the supplies at home.  - Discussed importance of checking glucose at least qam fasting - Continue Insulin at current regimen for now  - Return in 1 week with glucose values  Left foot pain Likely residual gout pain vs pain from hyperkeratotic lesion on left foot.    - 30 pills of Tramadol with no refills provided - Cannot take NSAIDS due to CAD - Follow up in 1 week  Orders Placed This Encounter  Procedures  . Ambulatory referral to Ophthalmology    Referral Priority:   Routine    Referral Type:   Consultation    Referral Reason:   Specialty Services Required    Requested Specialty:   Ophthalmology    Number of Visits Requested:   1   Meds ordered this encounter  Medications  . traMADol (ULTRAM) 50 MG tablet    Sig: Take 1 tablet (50 mg total) by mouth every 8 (eight) hours as needed.    Dispense:  30 tablet    Refill:  0    Smitty Cords, MD [place], PGY-3

## 2016-10-27 ENCOUNTER — Telehealth: Payer: Self-pay | Admitting: Hematology and Oncology

## 2016-10-27 NOTE — Telephone Encounter (Signed)
Spoke with patient re 9/28 appointment

## 2016-10-28 ENCOUNTER — Ambulatory Visit (HOSPITAL_BASED_OUTPATIENT_CLINIC_OR_DEPARTMENT_OTHER): Payer: Medicare Other

## 2016-10-28 ENCOUNTER — Ambulatory Visit (INDEPENDENT_AMBULATORY_CARE_PROVIDER_SITE_OTHER): Payer: Medicare Other | Admitting: Pharmacist

## 2016-10-28 VITALS — BP 142/54 | HR 66 | Temp 98.0°F | Resp 18

## 2016-10-28 DIAGNOSIS — N183 Chronic kidney disease, stage 3 unspecified: Secondary | ICD-10-CM

## 2016-10-28 DIAGNOSIS — I82512 Chronic embolism and thrombosis of left femoral vein: Secondary | ICD-10-CM

## 2016-10-28 DIAGNOSIS — Z7901 Long term (current) use of anticoagulants: Secondary | ICD-10-CM | POA: Diagnosis not present

## 2016-10-28 DIAGNOSIS — N184 Chronic kidney disease, stage 4 (severe): Principal | ICD-10-CM

## 2016-10-28 DIAGNOSIS — D631 Anemia in chronic kidney disease: Secondary | ICD-10-CM

## 2016-10-28 DIAGNOSIS — D509 Iron deficiency anemia, unspecified: Secondary | ICD-10-CM

## 2016-10-28 LAB — POCT INR: INR: 2.8

## 2016-10-28 MED ORDER — SODIUM CHLORIDE 0.9 % IV SOLN
Freq: Once | INTRAVENOUS | Status: AC
  Administered 2016-10-28: 15:00:00 via INTRAVENOUS

## 2016-10-28 MED ORDER — SODIUM CHLORIDE 0.9 % IV SOLN
510.0000 mg | Freq: Once | INTRAVENOUS | Status: AC
  Administered 2016-10-28: 510 mg via INTRAVENOUS
  Filled 2016-10-28: qty 17

## 2016-10-28 NOTE — Patient Instructions (Signed)

## 2016-10-29 ENCOUNTER — Ambulatory Visit (INDEPENDENT_AMBULATORY_CARE_PROVIDER_SITE_OTHER): Payer: Medicare Other | Admitting: Podiatry

## 2016-10-29 ENCOUNTER — Other Ambulatory Visit (HOSPITAL_COMMUNITY): Payer: Self-pay

## 2016-10-29 ENCOUNTER — Encounter: Payer: Self-pay | Admitting: Podiatry

## 2016-10-29 DIAGNOSIS — M79675 Pain in left toe(s): Secondary | ICD-10-CM | POA: Diagnosis not present

## 2016-10-29 DIAGNOSIS — M79672 Pain in left foot: Secondary | ICD-10-CM

## 2016-10-29 DIAGNOSIS — B351 Tinea unguium: Secondary | ICD-10-CM

## 2016-10-29 DIAGNOSIS — S98131S Complete traumatic amputation of one right lesser toe, sequela: Secondary | ICD-10-CM

## 2016-10-29 DIAGNOSIS — M79676 Pain in unspecified toe(s): Secondary | ICD-10-CM

## 2016-10-29 DIAGNOSIS — M79674 Pain in right toe(s): Secondary | ICD-10-CM

## 2016-10-29 DIAGNOSIS — I739 Peripheral vascular disease, unspecified: Secondary | ICD-10-CM | POA: Diagnosis not present

## 2016-10-29 DIAGNOSIS — Q828 Other specified congenital malformations of skin: Secondary | ICD-10-CM

## 2016-10-29 DIAGNOSIS — E1140 Type 2 diabetes mellitus with diabetic neuropathy, unspecified: Secondary | ICD-10-CM | POA: Diagnosis not present

## 2016-10-29 HISTORY — DX: Pain in left foot: M79.672

## 2016-10-29 NOTE — Assessment & Plan Note (Signed)
Likely residual gout pain vs pain from hyperkeratotic lesion on left foot.  - 30 pills of Tramadol with no refills provided - Cannot take NSAIDS due to CAD - Follow up in 1 week

## 2016-10-29 NOTE — Progress Notes (Signed)
Paramedicine Encounter    Patient ID: Leonard [name], male    DOB: [birth year], 66 y.o.   MRN: [id number]    Patient Care Team: [name], MD as PCP - General (Family Medicine) [name], MD as Consulting Physician (Nephrology)  Patient Active Problem List   Diagnosis Date Noted  . Long term (current) use of anticoagulants [Z79.01] 09/09/2016  . Chronic deep vein thrombosis (DVT) of femoral vein of left lower extremity ([place]) 09/03/2016  . Pleural effusion   . Oxygen desaturation   . Morbid obesity due to excess calories ([place]) 03/17/2016  . Anemia due to stage 4 chronic kidney disease treated with darbepoetin ([place]) 02/28/2016  . Fatigue associated with anemia 01/21/2016  . Diabetic retinopathy ([place]) 11/13/2015  . Dyspnea 08/28/2015  . CHF (congestive heart failure) ([place]) 08/27/2015  . Iron deficiency anemia 08/03/2015  . Claudication ([place])   . Type 2 diabetes mellitus with stage 3 chronic kidney disease, with long-term current use of insulin ([place])   . Secondary hypertension   . Hematuria 06/27/2015  . Skin lesion of face 02/22/2015  . 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 Deficiency anemia 01/15/2015  . CKD (chronic kidney disease) stage 4, GFR 15-29 ml/min (HCC) 01/15/2015  . Hypothyroidism 11/22/2014  . Acute on chronic diastolic congestive heart failure ([place]) 07/04/2014  . COPD GOLD III  02/16/2014  . History of tobacco use 08/23/2013  . 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 CAD (coronary artery disease) 04/06/2013  . PVD - hx of Rt SFA PTA and s/p Rt 4-5th toe amp Dec 2014 04/05/2013  . Essential hypertension 11/25/2012  . Mixed hyperlipidemia 07/17/2008    Current Outpatient Prescriptions:  .  allopurinol (ZYLOPRIM) 100 MG tablet, Take 1 tablet (100 mg total) by mouth daily., Disp: 30 tablet, Rfl: 6 .  amLODipine (NORVASC) 10 MG tablet, Take 1 tablet (10 mg total) by mouth daily., Disp: 30 tablet, Rfl: 6 .  aspirin 81 MG chewable  tablet, Chew 81 mg by mouth daily., Disp: , Rfl:  .  atorvastatin (LIPITOR) 40 MG tablet, Take 40 mg by mouth daily., Disp: , Rfl:  .  Blood Glucose Monitoring Suppl (ONETOUCH VERIO FLEX SYSTEM) w/Device KIT, 1 kit by Does not apply route daily., Disp: 1 kit, Rfl: 3 .  carvedilol (COREG) 25 MG tablet, Take 2 tablets (50 mg total) by mouth 2 (two) times daily with a meal., Disp: 120 tablet, Rfl: 3 .  cloNIDine (CATAPRES) 0.2 MG tablet, Take 2 tablets (0.4 mg total) by mouth 2 (two) times daily., Disp: 120 tablet, Rfl: 3 .  doxazosin (CARDURA) 8 MG tablet, Take 1 tablet (8 mg total) by mouth 2 (two) times daily., Disp: 60 tablet, Rfl: 6 .  ferrous sulfate 325 (65 FE) MG tablet, TAKE ONE (1) TABLET BY MOUTH TWO (2) TIMES DAILY WITH A MEAL, Disp: 60 tablet, Rfl: 3 .  glucose blood (ONETOUCH VERIO) test strip, Use as instructed to test three times daily. ICD-10 code: E11.22, Disp: 100 each, Rfl: 12 .  hydrALAZINE (APRESOLINE) 100 MG tablet, TAKE ONE TABLET BY MOUTH EVERY EIGHT HOURS, Disp: 90 tablet, Rfl: 0 .  insulin aspart (NOVOLOG FLEXPEN) 100 UNIT/ML FlexPen, Inject 15 Units into the skin 2 (two) times daily. , Disp: , Rfl:  .  Insulin Glargine (LANTUS SOLOSTAR) 100 UNIT/ML Solostar Pen, Inject 50 Units into the skin every morning., Disp: 15 mL, Rfl: 3 .  isosorbide mononitrate (IMDUR) 60 MG 24 hr tablet, Take 60  mg by mouth daily., Disp: , Rfl:  .  Lancets (ONETOUCH ULTRASOFT) lancets, Use as instructed, Disp: 100 each, Rfl: 12 .  levothyroxine (SYNTHROID, LEVOTHROID) 50 MCG tablet, Take 50 mcg by mouth daily before breakfast., Disp: , Rfl:  .  nitroGLYCERIN (NITROSTAT) 0.4 MG SL tablet, Place 1 tablet (0.4 mg total) under the tongue every 5 (five) minutes as needed for chest pain. (Patient not taking: Reported on 10/01/2016), Disp: 30 tablet, Rfl: 12 .  ONETOUCH DELICA LANCETS 38S MISC, 1 each by Does not apply route 3 (three) times daily. ICD-10 code: E11.22, Disp: 100 each, Rfl: 12 .   oxyCODONE-acetaminophen (PERCOCET) 5-325 MG tablet, Take 2 tablets by mouth every 4 (four) hours as needed., Disp: 20 tablet, Rfl: 0 .  pantoprazole (PROTONIX) 40 MG tablet, TAKE ONE (1) TABLET BY MOUTH EVERY DAY, Disp: 90 tablet, Rfl: 0 .  potassium chloride SA (K-DUR,KLOR-CON) 20 MEQ tablet, Take 20 mEq by mouth daily., Disp: , Rfl:  .  torsemide (DEMADEX) 20 MG tablet, Take 3 tablets (60 mg total) by mouth daily. May take an extra 20 mg Daily for weight gain above 212 lbs, Disp: 90 tablet, Rfl: 6 .  traMADol (ULTRAM) 50 MG tablet, Take 1 tablet (50 mg total) by mouth every 8 (eight) hours as needed., Disp: 30 tablet, Rfl: 0 .  warfarin (COUMADIN) 5 MG tablet, Take 2.5-5 mg by mouth See admin instructions. Take 2.5 mg (1/2 tablet) daily except take 5 mg (1 tablet) on Monday and Friday, Disp: , Rfl:  Allergies  Allergen Reactions  . No Known Allergies      Social History   Social History  . Marital status: Single    Spouse name: N/A  . Number of children: N/A  . Years of education: N/A   Occupational History  . Not on file.   Social History Main Topics  . Smoking status: Former Smoker    Packs/day: 1.00    Years: 46.00    Types: Cigarettes    Start date: 02/04/1967    Quit date: 02/03/2013  . Smokeless tobacco: Never Used     Comment: Doing well with quitting.  . Alcohol use No  . Drug use: No  . Sexual activity: Not Currently   Other Topics Concern  . Not on file   Social History Narrative   Lives with sister, [name] in [place].  Retired - former Sports coach.    Physical Exam      Future Appointments Date Time Provider [place]  10/29/2016 10:00 AM Gardiner Barefoot, DPM [facility]  11/06/2016 2:45 PM [facility] 5 [facility] None  11/06/2016 3:15 PM [facility] INJ NURSE [facility] None  11/11/2016 9:45 AM [facility]  11/14/2016 9:50 AM [name], MD [facility] [place]  11/20/2016 2:45  PM [facility] LAB 6 [facility] None  11/20/2016 3:15 PM Heath Lark, MD [facility] None  11/20/2016 3:45 PM [facility] INJ NURSE [facility] None  12/02/2016 10:00 AM [facility] PA/NP [facility] None  12/19/2016 2:00 PM [facility] VASC 3 [facility]  12/19/2016 2:45 PM Nickel, Sharmon Leyden, NP [facility]    ATF pt CAO x4 [place] [employer] [place] his way to another doctors appointment.  I came by yesterday for our regular scheduled weekly appointment but he was at the physicians office.  Pt asked me to "go ahead and fill his pill box because he doesn't have any pills in it today".  I was unable to get vitals  and assess him.  rx bottles verified and pill box refilled. Pt hasn't taken any hydralazine in the afternoon in the past week.   *rx called in: [id number] iso [id number] [place]   Shaynah Hund, EMT Paramedic 10/29/2016    ACTION: Home visit completed

## 2016-10-29 NOTE — Assessment & Plan Note (Signed)
Uncontrolled per last A1C of 11. Patient has good knowledge of his diabetes but continues not to check his glucose which makes it difficult to titrate his insulin. He has the supplies at home.  - Discussed importance of checking glucose at least qam fasting - Continue Insulin at current regimen for now  - Return in 1 week with glucose values

## 2016-10-29 NOTE — Progress Notes (Signed)
Patient ID: Leonard [name], male   DOB: 05/21/1950, 66 y.o.   MRN: [id number] Complaint:  Visit Type: Patient returns to my office for continued preventative foot care services. Complaint: Patient states" my nails have grown long and thick and become painful to walk and wear shoes" Patient has been diagnosed with DM with amputation 4,5 rays right foot... The patient presents for preventative foot care services. No changes to ROS.  Painful callus on forefoot left foot has developed.  Podiatric Exam: Vascular: dorsalis pedis and posterior tibial pulses are not  palpable bilateral. Capillary return is immediate.  Cold feet Absence of hair.  Sensorium: Absent  Semmes Weinstein monofilament test. Normal tactile sensation bilaterally. Nail Exam: Pt has thick disfigured discolored nails with subungual debris noted bilateral entire nail hallux through fifth toenails Ulcer Exam: There is no evidence of ulcer or pre-ulcerative changes or infection. Orthopedic Exam: Muscle tone and strength are WNL. No limitations in general ROM. No crepitus or effusions noted. Foot type and digits show no abnormalities. Bony prominences are unremarkable.Amputation 4,5 digits right foot. Skin: Asymptomatic  Porokeratosis left foot.. No infection or ulcers  Diagnosis:  Onychomycosis, , Pain in right toe, pain in left toes.  Diabetic angiopathy and neuropathy with amputation.  Porokeratosis  Left foot.  Treatment & Plan Procedures and Treatment: Consent by patient was obtained for treatment procedures. The patient understood the discussion of treatment and procedures well. All questions were answered thoroughly reviewed. Debridement of mycotic and hypertrophic toenails, 1 through 5 bilateral and clearing of subungual debris. No ulceration, no infection noted. Requests no drill usage.  Debride porokeratosis left.  Dispense diabetic shoes.Patient presents today and was dispensed 0ne pair ( two units) of medically necessary extra  depth shoes with three pair( six units) of custom molded multiple density inserts. The shoes and the inserts are fitted to the patients ' feet and are noted to fit well and are free of defect.  Length and width of the shoes are also acceptable.  Patient was given written and verbal  instructions for wearing.  If any concerns arrive with the shoes or inserts, the patient is to call the office.Patient is to follow up with doctor in six weeks.   Return Visit-Office Procedure: Patient instructed to return to the office for a follow up visit 3 months for continued evaluation and treatment.    Gardiner Barefoot DPM

## 2016-11-04 ENCOUNTER — Other Ambulatory Visit (HOSPITAL_COMMUNITY): Payer: Self-pay

## 2016-11-04 NOTE — Progress Notes (Signed)
Paramedicine Encounter    Patient ID: Leonard [name], male    DOB: [birth year], 66 y.o.   MRN: [id number]    Patient Care Team: [name], MD as PCP - General (Family Medicine) [name], MD as Consulting Physician (Nephrology)  Patient Active Problem List   Diagnosis Date Noted  . Left foot pain 10/29/2016  . Long term (current) use of anticoagulants [Z79.01] 09/09/2016  . Chronic deep vein thrombosis (DVT) of femoral vein of left lower extremity ([place]) 09/03/2016  . Pleural effusion   . Oxygen desaturation   . Morbid obesity due to excess calories ([place]) 03/17/2016  . Anemia due to stage 4 chronic kidney disease treated with darbepoetin ([place]) 02/28/2016  . Fatigue associated with anemia 01/21/2016  . Diabetic retinopathy ([place]) 11/13/2015  . Dyspnea 08/28/2015  . CHF (congestive heart failure) ([place]) 08/27/2015  . Iron deficiency anemia 08/03/2015  . Claudication ([place])   . 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 Secondary hypertension   . Hematuria 06/27/2015  . Skin lesion of face 02/22/2015  . 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 Deficiency anemia 01/15/2015  . CKD (chronic kidney disease) stage 4, GFR 15-29 ml/min (HCC) 01/15/2015  . Hypothyroidism 11/22/2014  . Acute on chronic diastolic congestive heart failure ([place]) 07/04/2014  . COPD GOLD III  02/16/2014  . History of tobacco use 08/23/2013  . 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 CAD (coronary artery disease) 04/06/2013  . PVD - hx of Rt SFA PTA and s/p Rt 4-5th toe amp Dec 2014 04/05/2013  . Essential hypertension 11/25/2012  . Mixed hyperlipidemia 07/17/2008    Current Outpatient Prescriptions:  .  allopurinol (ZYLOPRIM) 100 MG tablet, Take 1 tablet (100 mg total) by mouth daily., Disp: 30 tablet, Rfl: 6 .  amLODipine (NORVASC) 10 MG tablet, Take 1 tablet (10 mg total) by mouth daily., Disp: 30 tablet, Rfl:  6 .  aspirin 81 MG chewable tablet, Chew 81 mg by mouth daily., Disp: , Rfl:  .  atorvastatin (LIPITOR) 40 MG tablet, Take 40 mg by mouth daily., Disp: , Rfl:  .  carvedilol (COREG) 25 MG tablet, Take 2 tablets (50 mg total) by mouth 2 (two) times daily with a meal., Disp: 120 tablet, Rfl: 3 .  cloNIDine (CATAPRES) 0.2 MG tablet, Take 2 tablets (0.4 mg total) by mouth 2 (two) times daily., Disp: 120 tablet, Rfl: 3 .  doxazosin (CARDURA) 8 MG tablet, Take 1 tablet (8 mg total) by mouth 2 (two) times daily., Disp: 60 tablet, Rfl: 6 .  ferrous sulfate 325 (65 FE) MG tablet, TAKE ONE (1) TABLET BY MOUTH TWO (2) TIMES DAILY WITH A MEAL, Disp: 60 tablet, Rfl: 3 .  hydrALAZINE (APRESOLINE) 100 MG tablet, TAKE ONE TABLET BY MOUTH EVERY EIGHT HOURS, Disp: 90 tablet, Rfl: 0 .  isosorbide mononitrate (IMDUR) 60 MG 24 hr tablet, Take 60 mg by mouth daily., Disp: , Rfl:  .  levothyroxine (SYNTHROID, LEVOTHROID) 50 MCG tablet, Take 50 mcg by mouth daily before breakfast., Disp: , Rfl:  .  pantoprazole (PROTONIX) 40 MG tablet, TAKE ONE (1) TABLET BY MOUTH EVERY DAY, Disp: 90 tablet, Rfl: 0 .  potassium chloride SA (K-DUR,KLOR-CON) 20 MEQ tablet, Take 20 mEq by mouth daily., Disp: , Rfl:  .  torsemide (DEMADEX) 20 MG tablet, Take 3 tablets (60 mg total) by mouth daily. May take an extra 20 mg Daily  for weight gain above 212 lbs, Disp: 90 tablet, Rfl: 6 .  warfarin (COUMADIN) 5 MG tablet, Take 2.5-5 mg by mouth See admin instructions. Take 2.5 mg (1/2 tablet) daily except take 5 mg (1 tablet) on Monday and Friday, Disp: , Rfl:  .  Blood Glucose Monitoring Suppl (ONETOUCH VERIO FLEX SYSTEM) w/Device KIT, 1 kit by Does not apply route daily., Disp: 1 kit, Rfl: 3 .  glucose blood (ONETOUCH VERIO) test strip, Use as instructed to test three times daily. ICD-10 code: E11.22, Disp: 100 each, Rfl: 12 .  insulin aspart (NOVOLOG FLEXPEN) 100 UNIT/ML FlexPen, Inject 15 Units into the skin 2 (two) times daily. , Disp: , Rfl:   .  Insulin Glargine (LANTUS SOLOSTAR) 100 UNIT/ML Solostar Pen, Inject 50 Units into the skin every morning., Disp: 15 mL, Rfl: 3 .  Lancets (ONETOUCH ULTRASOFT) lancets, Use as instructed, Disp: 100 each, Rfl: 12 .  nitroGLYCERIN (NITROSTAT) 0.4 MG SL tablet, Place 1 tablet (0.4 mg total) under the tongue every 5 (five) minutes as needed for chest pain., Disp: 30 tablet, Rfl: 12 .  ONETOUCH DELICA LANCETS 61Y MISC, 1 each by Does not apply route 3 (three) times daily. ICD-10 code: E11.22, Disp: 100 each, Rfl: 12 .  oxyCODONE-acetaminophen (PERCOCET) 5-325 MG tablet, Take 2 tablets by mouth every 4 (four) hours as needed., Disp: 20 tablet, Rfl: 0 .  traMADol (ULTRAM) 50 MG tablet, Take 1 tablet (50 mg total) by mouth every 8 (eight) hours as needed., Disp: 30 tablet, Rfl: 0 Allergies  Allergen Reactions  . No Known Allergies      Social History   Social History  . Marital status: Single    Spouse name: N/A  . Number of children: N/A  . Years of education: N/A   Occupational History  . Not on file.   Social History Main Topics  . Smoking status: Former Smoker    Packs/day: 1.00    Years: 46.00    Types: Cigarettes    Start date: 02/04/1967    Quit date: 02/03/2013  . Smokeless tobacco: Never Used     Comment: Doing well with quitting.  . Alcohol use No  . Drug use: No  . Sexual activity: Not Currently   Other Topics Concern  . Not on file   Social History Narrative   Lives with sister, Leonard [name] in [place].  Retired - former Sports coach.    Physical Exam  Pulmonary/Chest: No respiratory distress.  Abdominal: He exhibits no distension. There is no tenderness. There is no guarding.  Musculoskeletal: He exhibits edema.  Skin: He is not diaphoretic.        Future Appointments Date Time Provider [place]  11/06/2016 2:45 PM [facility] 5 [facility] None  11/06/2016 3:15 PM [facility] FLUSH NURSE [facility] None  11/11/2016 9:45 AM [facility]  11/14/2016 9:50 AM [name], MD [facility] [place]  11/20/2016 2:45 PM [facility] LAB 6 [facility] None  11/20/2016 3:15 PM Heath Lark, MD [facility] None  11/20/2016 3:45 PM [facility] INJ NURSE [facility] None  12/02/2016 10:00 AM [facility] PA/NP [facility] None  12/19/2016 2:00 PM [facility] VASC 3 [facility]  12/19/2016 2:45 PM Nickel, Sharmon Leyden, NP [facility]  02/04/2017 10:15 AM Gardiner Barefoot, DPM [facility]    ATF pt CAO x4 sitting on the recliner c/o leg pain in the left leg.  He missed all of his afternoon meds and several evening.  Pt has no excuse  for missing his pills.  He has made all of his appointments last week. Pt is still eating foods high in sodium.  He hasn't taken his insulin yet but he did take his morning meds. Pt denies sob, dizziness, lightheadedness and chest pain.  rx bottles verified and pill box refilled.    BP 118/60   Pulse 64   Resp 16   SpO2 97%  cbg 286  Weight yesterday-200     Baileigh Modisette, EMT Paramedic 11/04/2016    ACTION: Home visit completed

## 2016-11-06 ENCOUNTER — Ambulatory Visit (HOSPITAL_BASED_OUTPATIENT_CLINIC_OR_DEPARTMENT_OTHER): Payer: Medicare Other

## 2016-11-06 ENCOUNTER — Ambulatory Visit (HOSPITAL_COMMUNITY)
Admission: RE | Admit: 2016-11-06 | Discharge: 2016-11-06 | Disposition: A | Discharge status: Home / Self Care | Payer: Medicare Other | Source: Ambulatory Visit | Attending: Hematology and Oncology | Admitting: Hematology and Oncology

## 2016-11-06 ENCOUNTER — Other Ambulatory Visit (HOSPITAL_BASED_OUTPATIENT_CLINIC_OR_DEPARTMENT_OTHER): Payer: Medicare Other

## 2016-11-06 ENCOUNTER — Other Ambulatory Visit: Payer: Self-pay | Admitting: Hematology and Oncology

## 2016-11-06 VITALS — BP 132/54 | HR 68 | Temp 98.0°F | Resp 18

## 2016-11-06 DIAGNOSIS — D631 Anemia in chronic kidney disease: Secondary | ICD-10-CM

## 2016-11-06 DIAGNOSIS — N184 Chronic kidney disease, stage 4 (severe): Secondary | ICD-10-CM

## 2016-11-06 DIAGNOSIS — D539 Nutritional anemia, unspecified: Secondary | ICD-10-CM

## 2016-11-06 DIAGNOSIS — N189 Chronic kidney disease, unspecified: Principal | ICD-10-CM

## 2016-11-06 LAB — CBC WITH DIFFERENTIAL/PLATELET
BASO%: 0.3 % (ref 0.0–2.0)
BASOS ABS: 0 10*3/uL (ref 0.0–0.1)
EOS%: 1.3 % (ref 0.0–7.0)
Eosinophils Absolute: 0.1 10*3/uL (ref 0.0–0.5)
HEMATOCRIT: 24.6 % — AB (ref 38.4–49.9)
HGB: 7.5 g/dL — ABNORMAL LOW (ref 13.0–17.1)
LYMPH#: 1 10*3/uL (ref 0.9–3.3)
LYMPH%: 14.5 % (ref 14.0–49.0)
MCH: 22.3 pg — AB (ref 27.2–33.4)
MCHC: 30.5 g/dL — AB (ref 32.0–36.0)
MCV: 73.2 fL — ABNORMAL LOW (ref 79.3–98.0)
MONO#: 0.5 10*3/uL (ref 0.1–0.9)
MONO%: 7.8 % (ref 0.0–14.0)
NEUT#: 5.2 10*3/uL (ref 1.5–6.5)
NEUT%: 76.1 % — AB (ref 39.0–75.0)
Platelets: 248 10*3/uL (ref 140–400)
RBC: 3.36 10*6/uL — ABNORMAL LOW (ref 4.20–5.82)
RDW: 21.9 % — ABNORMAL HIGH (ref 11.0–14.6)
WBC: 6.9 10*3/uL (ref 4.0–10.3)

## 2016-11-06 LAB — PREPARE RBC (CROSSMATCH)

## 2016-11-06 MED ORDER — DARBEPOETIN ALFA 500 MCG/ML IJ SOSY
500.0000 ug | PREFILLED_SYRINGE | Freq: Once | INTRAMUSCULAR | Status: AC
  Administered 2016-11-06: 500 ug via SUBCUTANEOUS
  Filled 2016-11-06: qty 1

## 2016-11-07 ENCOUNTER — Telehealth: Payer: Self-pay | Admitting: Hematology and Oncology

## 2016-11-07 ENCOUNTER — Ambulatory Visit (HOSPITAL_BASED_OUTPATIENT_CLINIC_OR_DEPARTMENT_OTHER): Payer: Medicare Other

## 2016-11-07 ENCOUNTER — Other Ambulatory Visit: Payer: Self-pay

## 2016-11-07 ENCOUNTER — Other Ambulatory Visit: Payer: Self-pay | Admitting: Hematology and Oncology

## 2016-11-07 DIAGNOSIS — D631 Anemia in chronic kidney disease: Secondary | ICD-10-CM

## 2016-11-07 DIAGNOSIS — N184 Chronic kidney disease, stage 4 (severe): Secondary | ICD-10-CM | POA: Diagnosis present

## 2016-11-07 DIAGNOSIS — I5033 Acute on chronic diastolic (congestive) heart failure: Secondary | ICD-10-CM

## 2016-11-07 MED ORDER — SODIUM CHLORIDE 0.9% FLUSH
10.0000 mL | INTRAVENOUS | Status: DC | PRN
Start: 2016-11-07 — End: 2016-11-07
  Filled 2016-11-07: qty 10

## 2016-11-07 MED ORDER — DIPHENHYDRAMINE HCL 25 MG PO CAPS
25.0000 mg | ORAL_CAPSULE | Freq: Once | ORAL | Status: AC
  Administered 2016-11-07: 25 mg via ORAL

## 2016-11-07 MED ORDER — ACETAMINOPHEN 325 MG PO TABS
ORAL_TABLET | ORAL | Status: AC
  Filled 2016-11-07: qty 2

## 2016-11-07 MED ORDER — DIPHENHYDRAMINE HCL 25 MG PO CAPS
ORAL_CAPSULE | ORAL | Status: AC
  Filled 2016-11-07: qty 1

## 2016-11-07 MED ORDER — SODIUM CHLORIDE 0.9 % IV SOLN
250.0000 mL | Freq: Once | INTRAVENOUS | Status: AC
  Administered 2016-11-07: 250 mL via INTRAVENOUS

## 2016-11-07 MED ORDER — ACETAMINOPHEN 325 MG PO TABS
650.0000 mg | ORAL_TABLET | Freq: Once | ORAL | Status: AC
  Administered 2016-11-07: 650 mg via ORAL

## 2016-11-07 NOTE — Progress Notes (Signed)
Informed patient to be here tomorrow between 8 and 8:30 tomorrow, 11/08/16. Pt verbalized understanding.  Cyndia Bent RN

## 2016-11-07 NOTE — Patient Instructions (Signed)

## 2016-11-07 NOTE — Progress Notes (Signed)
Patient tolerated infusion well. Denies concerns on exit. Smith Mince, BSN, RN

## 2016-11-07 NOTE — Telephone Encounter (Signed)
Spoke to patient regarding 10/5 and 10/6 appointments.

## 2016-11-08 ENCOUNTER — Ambulatory Visit (HOSPITAL_BASED_OUTPATIENT_CLINIC_OR_DEPARTMENT_OTHER): Payer: Medicare Other

## 2016-11-08 ENCOUNTER — Other Ambulatory Visit: Payer: Self-pay | Admitting: *Deleted

## 2016-11-08 DIAGNOSIS — N184 Chronic kidney disease, stage 4 (severe): Secondary | ICD-10-CM | POA: Diagnosis present

## 2016-11-08 DIAGNOSIS — D631 Anemia in chronic kidney disease: Secondary | ICD-10-CM

## 2016-11-08 MED ORDER — ACETAMINOPHEN 325 MG PO TABS
ORAL_TABLET | ORAL | Status: AC
  Filled 2016-11-08: qty 2

## 2016-11-08 MED ORDER — DIPHENHYDRAMINE HCL 25 MG PO CAPS
ORAL_CAPSULE | ORAL | Status: AC
  Filled 2016-11-08: qty 1

## 2016-11-08 MED ORDER — DIPHENHYDRAMINE HCL 25 MG PO CAPS
25.0000 mg | ORAL_CAPSULE | Freq: Once | ORAL | Status: AC
  Administered 2016-11-08: 25 mg via ORAL

## 2016-11-08 MED ORDER — ACETAMINOPHEN 325 MG PO TABS
650.0000 mg | ORAL_TABLET | Freq: Once | ORAL | Status: AC
  Administered 2016-11-08: 650 mg via ORAL

## 2016-11-08 MED ORDER — SODIUM CHLORIDE 0.9 % IV SOLN
250.0000 mL | Freq: Once | INTRAVENOUS | Status: AC
  Administered 2016-11-08: 250 mL via INTRAVENOUS

## 2016-11-08 NOTE — Patient Instructions (Signed)
Blood Transfusion A blood transfusion is a procedure in which you are given blood through an IV tube. You may need this procedure because of:  Illness.  Surgery.  Injury.  The blood may come from someone else (a donor). You may also be able to donate blood for yourself (autologous blood donation). The blood given in a transfusion is made up of different types of cells. You may get:  Red blood cells. These carry oxygen to the cells in the body.  White blood cells. These help you fight infections.  Platelets. These help your blood to clot.  Plasma. This is the liquid part of your blood. It helps with fluid imbalances.  If you have a clotting disorder, you may also get other types of blood products. What happens before the procedure?  You will have a blood test to find out your blood type. The test also finds out what type of blood your body will accept and matches it to the donor type.  If you are going to have a planned surgery, you may be able to donate your own blood. This may be done in case you need a transfusion.  If you have had an allergic reaction to a transfusion in the past, you may be given medicine to help prevent a reaction. This medicine may be given to you by mouth or through an IV.  You will have your temperature, blood pressure, and pulse checked.  Follow instructions from your doctor about what you cannot eat or drink.  Ask your doctor about: ? Changing or stopping your regular medicines. This is important if you take diabetes medicines or blood thinners. ? Taking medicines such as aspirin and ibuprofen. These medicines can thin your blood. Do not take these medicines before your procedure if your doctor tells you not to. What happens during the procedure?  An IV tube will be put into one of your veins.  The bag of donated blood will be attached to your IV tube. Then, the blood will enter through your vein.  Your temperature, blood pressure, and pulse will be  checked regularly during the procedure. This is done to find early signs of a transfusion reaction.  If you have any signs or symptoms of a reaction, your transfusion will be stopped. You may also be given medicine.  When the transfusion is done, your IV tube will be taken out.  Pressure may be applied to the IV site for a few minutes.  A bandage (dressing) will be put on the IV site. The procedure may vary among doctors and hospitals. What happens after the procedure?  Your temperature, blood pressure, heart rate, breathing rate, and blood oxygen level will be checked often.  Your blood may be tested to see how you are responding to the transfusion.  You may be warmed with fluids or blankets. This is done to keep the temperature of your body normal. Summary  A blood transfusion is a procedure in which you are given blood through an IV tube.  The blood may come from someone else (a donor). You may also be able to donate blood for yourself.  If you have had an allergic reaction to a transfusion in the past, you may be given medicine to help prevent a reaction. This medicine may be given to you by mouth or through an IV tube.  Your temperature, blood pressure, heart rate, breathing rate, and blood oxygen level will be checked often.  Your blood may be tested to   see how you are responding to the transfusion. This information is not intended to replace advice given to you by your health care provider. Make sure you discuss any questions you have with your health care provider. Document Released: 04/18/2008 Document Revised: 09/14/2015 Document Reviewed: 09/14/2015 Elsevier Interactive Patient Education  2017 [employer]  

## 2016-11-10 LAB — BPAM RBC
Blood Product Expiration Date: 201810262359
Blood Product Expiration Date: 201810262359
ISSUE DATE / TIME: 201810060822
ISSUE DATE / TIME: 201810051533
UNIT TYPE AND RH: 1700
UNIT TYPE AND RH: 1700

## 2016-11-10 LAB — TYPE AND SCREEN
ABO/RH(D): B NEG
ANTIBODY SCREEN: NEGATIVE
UNIT DIVISION: 0
Unit division: 0

## 2016-11-11 ENCOUNTER — Ambulatory Visit (INDEPENDENT_AMBULATORY_CARE_PROVIDER_SITE_OTHER): Payer: Medicare Other | Admitting: *Deleted

## 2016-11-11 ENCOUNTER — Other Ambulatory Visit (HOSPITAL_COMMUNITY): Payer: Self-pay | Admitting: Adult Health

## 2016-11-11 ENCOUNTER — Other Ambulatory Visit (HOSPITAL_COMMUNITY): Payer: Self-pay

## 2016-11-11 DIAGNOSIS — Z5181 Encounter for therapeutic drug level monitoring: Secondary | ICD-10-CM | POA: Diagnosis not present

## 2016-11-11 DIAGNOSIS — Z7901 Long term (current) use of anticoagulants: Secondary | ICD-10-CM | POA: Diagnosis not present

## 2016-11-11 DIAGNOSIS — I82512 Chronic embolism and thrombosis of left femoral vein: Secondary | ICD-10-CM | POA: Diagnosis not present

## 2016-11-11 DIAGNOSIS — Z951 Presence of aortocoronary bypass graft: Secondary | ICD-10-CM

## 2016-11-11 LAB — POCT INR: INR: 3.7

## 2016-11-11 NOTE — Progress Notes (Signed)
Paramedicine Encounter    Patient ID: Leonard [name], male    DOB: [birth year], 66 y.o.   MRN: [id number]    Patient Care Team: [name], MD as PCP - General (Family Medicine) [name], MD as Consulting Physician (Nephrology)  Patient Active Problem List   Diagnosis Date Noted  . Left foot pain 10/29/2016  . Long term (current) use of anticoagulants [Z79.01] 09/09/2016  . Chronic deep vein thrombosis (DVT) of femoral vein of left lower extremity ([place]) 09/03/2016  . Pleural effusion   . Oxygen desaturation   . Morbid obesity due to excess calories ([place]) 03/17/2016  . Anemia due to stage 4 chronic kidney disease treated with darbepoetin ([place]) 02/28/2016  . Fatigue associated with anemia 01/21/2016  . Diabetic retinopathy ([place]) 11/13/2015  . Dyspnea 08/28/2015  . CHF (congestive heart failure) ([place]) 08/27/2015  . Iron deficiency anemia 08/03/2015  . Claudication ([place])   . 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 Secondary hypertension   . Hematuria 06/27/2015  . Skin lesion of face 02/22/2015  . 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 Deficiency anemia 01/15/2015  . CKD (chronic kidney disease) stage 4, GFR 15-29 ml/min (HCC) 01/15/2015  . Hypothyroidism 11/22/2014  . Acute on chronic diastolic congestive heart failure ([place]) 07/04/2014  . COPD GOLD III  02/16/2014  . History of tobacco use 08/23/2013  . 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 CAD (coronary artery disease) 04/06/2013  . PVD - hx of Rt SFA PTA and s/p Rt 4-5th toe amp Dec 2014 04/05/2013  . Essential hypertension 11/25/2012  . Mixed hyperlipidemia 07/17/2008    Current Outpatient Prescriptions:  .  allopurinol (ZYLOPRIM) 100 MG tablet, Take 1 tablet (100 mg total) by mouth daily., Disp: 30 tablet, Rfl: 6 .  amLODipine (NORVASC) 10 MG tablet, Take 1 tablet (10 mg total) by mouth daily., Disp: 30 tablet, Rfl:  6 .  aspirin 81 MG chewable tablet, Chew 81 mg by mouth daily., Disp: , Rfl:  .  atorvastatin (LIPITOR) 40 MG tablet, Take 40 mg by mouth daily., Disp: , Rfl:  .  carvedilol (COREG) 25 MG tablet, Take 2 tablets (50 mg total) by mouth 2 (two) times daily with a meal., Disp: 120 tablet, Rfl: 3 .  cloNIDine (CATAPRES) 0.2 MG tablet, Take 2 tablets (0.4 mg total) by mouth 2 (two) times daily., Disp: 120 tablet, Rfl: 3 .  doxazosin (CARDURA) 8 MG tablet, Take 1 tablet (8 mg total) by mouth 2 (two) times daily., Disp: 60 tablet, Rfl: 6 .  ferrous sulfate 325 (65 FE) MG tablet, TAKE ONE (1) TABLET BY MOUTH TWO (2) TIMES DAILY WITH A MEAL, Disp: 60 tablet, Rfl: 3 .  hydrALAZINE (APRESOLINE) 100 MG tablet, TAKE ONE TABLET BY MOUTH EVERY EIGHT HOURS, Disp: 90 tablet, Rfl: 0 .  isosorbide mononitrate (IMDUR) 60 MG 24 hr tablet, Take 60 mg by mouth daily., Disp: , Rfl:  .  levothyroxine (SYNTHROID, LEVOTHROID) 50 MCG tablet, Take 50 mcg by mouth daily before breakfast., Disp: , Rfl:  .  pantoprazole (PROTONIX) 40 MG tablet, TAKE ONE (1) TABLET BY MOUTH EVERY DAY, Disp: 90 tablet, Rfl: 0 .  potassium chloride SA (K-DUR,KLOR-CON) 20 MEQ tablet, Take 20 mEq by mouth daily., Disp: , Rfl:  .  torsemide (DEMADEX) 20 MG tablet, Take 3 tablets (60 mg total) by mouth daily. May take an extra 20 mg Daily  for weight gain above 212 lbs, Disp: 90 tablet, Rfl: 6 .  Blood Glucose Monitoring Suppl (ONETOUCH VERIO FLEX SYSTEM) w/Device KIT, 1 kit by Does not apply route daily., Disp: 1 kit, Rfl: 3 .  glucose blood (ONETOUCH VERIO) test strip, Use as instructed to test three times daily. ICD-10 code: E11.22, Disp: 100 each, Rfl: 12 .  insulin aspart (NOVOLOG FLEXPEN) 100 UNIT/ML FlexPen, Inject 15 Units into the skin 2 (two) times daily. , Disp: , Rfl:  .  Insulin Glargine (LANTUS SOLOSTAR) 100 UNIT/ML Solostar Pen, Inject 50 Units into the skin every morning., Disp: 15 mL, Rfl: 3 .  Lancets (ONETOUCH ULTRASOFT) lancets, Use as  instructed, Disp: 100 each, Rfl: 12 .  nitroGLYCERIN (NITROSTAT) 0.4 MG SL tablet, Place 1 tablet (0.4 mg total) under the tongue every 5 (five) minutes as needed for chest pain., Disp: 30 tablet, Rfl: 12 .  ONETOUCH DELICA LANCETS 37S MISC, 1 each by Does not apply route 3 (three) times daily. ICD-10 code: E11.22, Disp: 100 each, Rfl: 12 .  oxyCODONE-acetaminophen (PERCOCET) 5-325 MG tablet, Take 2 tablets by mouth every 4 (four) hours as needed., Disp: 20 tablet, Rfl: 0 .  traMADol (ULTRAM) 50 MG tablet, Take 1 tablet (50 mg total) by mouth every 8 (eight) hours as needed., Disp: 30 tablet, Rfl: 0 .  warfarin (COUMADIN) 5 MG tablet, Take 2.5-5 mg by mouth See admin instructions. Take 2.5 mg (1/2 tablet) daily except take 5 mg (1 tablet) on Monday and Friday, Disp: , Rfl:  Allergies  Allergen Reactions  . No Known Allergies      Social History   Social History  . Marital status: Single    Spouse name: N/A  . Number of children: N/A  . Years of education: N/A   Occupational History  . Not on file.   Social History Main Topics  . Smoking status: Former Smoker    Packs/day: 1.00    Years: 46.00    Types: Cigarettes    Start date: 02/04/1967    Quit date: 02/03/2013  . Smokeless tobacco: Never Used     Comment: Doing well with quitting.  . Alcohol use No  . Drug use: No  . Sexual activity: Not Currently   Other Topics Concern  . Not on file   Social History Narrative   Lives with sister, [name] in [place].  Retired - former Sports coach.    Physical Exam  Pulmonary/Chest: No respiratory distress. He has no wheezes.  Abdominal: He exhibits no distension. There is no tenderness. There is no guarding.  Musculoskeletal: He exhibits edema.  Left leg swelling; blister on right big toe  Skin: Skin is warm and dry. He is not diaphoretic.        Future Appointments Date Time Provider [place]  11/14/2016 9:50 AM [name], MD [facility]   11/20/2016 2:45 PM [facility] LAB 6 [facility] None  11/20/2016 3:15 PM Heath Lark, MD [facility] None  11/20/2016 3:45 PM [facility] INJ NURSE [facility] None  11/25/2016 9:15 AM [facility]  12/02/2016 10:00 AM [facility] PA/NP [facility] None  12/19/2016 2:00 PM [facility] VASC 3 [facility]  12/19/2016 2:45 PM Nickel, Sharmon Leyden, NP [facility]  02/04/2017 10:15 AM Gardiner Barefoot, DPM [facility]    ATF pt CAO x4 sitting on the couch sleeping.  Pt just came in from getting lab work done at the coumadin clinic.  He advised me that the nurse wanted  me to give her a call.  Pt denies sob, lightheadedness, and chest pain.  He hasn't taken a nitro in a long time. Pt does have a llister on right big toe; no puss or seeping at this time. Pt stated that he wore some new shoes and was walking around a lot at the football game Friday. Pt denies pain in his toe.  Pt missed yesterday morning meds.  rx bottles verified and pill box refilled.   Spoke with sharon from the coumadin clinic, she advised that pt skipped todayscoumadin and resume 2.2m daily except fri 553m  **rx called in: Ferrous clonodine Allopurinol levothyoine   BP 118/62   Pulse 64   Resp 16   Wt 200 lb (90.7 kg)   SpO2 98%   BMI 30.41 kg/m  cbg 181  Weight yesterday-198 Last visit weight-200    Treylen Gibbs, EMT Paramedic 11/11/2016    ACTION: Home visit completed

## 2016-11-13 NOTE — Progress Notes (Signed)
Subjective:    Patient ID: Leonard [name] , male   DOB: [birth year] , 66 y.o..   MRN: [id number]  HPI  Leonard [name] is here for  Chief Complaint  Patient presents with  . Diabetes    1. Chronic Diabetes Disease Monitoring  Blood Sugar Ranges: Does not know. Last checked his glucose sometime this week once and it was 186  Polyuria: no   Visual problems: no   Last hemoglobin A1C:  Lab Results  Component Value Date   HGBA1C 11.0 10/10/2016   Medication Compliance: yes, sometimes. Lantus 50 units every morning, Novolog 15 units BID Medication Side Effects  Hypoglycemia: no   Preventitive Health Care  Eye Exam: overdue  Foot Exam: up to date   Diet pattern: tries to comply   Exercise: none  2. Left foot pain: Patient still has intermittent pain in his left foot where his gout was. The pain is steadily improving. Does not feel it at all after taking a Tramadol. Worse with ambulation, better at rest. Denies any erythema or skin changes in the area  Review of Systems: Per HPI.   Past Medical History: Patient Active Problem List   Diagnosis Date Noted  . Left foot pain 10/29/2016  . Long term (current) use of anticoagulants [Z79.01] 09/09/2016  . Chronic deep vein thrombosis (DVT) of femoral vein of left lower extremity ([place]) 09/03/2016  . Morbid obesity due to excess calories ([place]) 03/17/2016  . Anemia due to stage 4 chronic kidney disease treated with darbepoetin ([place]) 02/28/2016  . Fatigue associated with anemia 01/21/2016  . Diabetic retinopathy ([place]) 11/13/2015  . CHF (congestive heart failure) ([place]) 08/27/2015  . Iron deficiency anemia 08/03/2015  . Claudication ([place])   . Type 2 diabetes mellitus with stage 3 chronic kidney disease, with long-term current use of insulin ([place])   . Hematuria 06/27/2015  . Skin lesion of face 02/22/2015  . 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 Deficiency anemia 01/15/2015  . CKD (chronic kidney  disease) stage 4, GFR 15-29 ml/min (HCC) 01/15/2015  . Hypothyroidism 11/22/2014  . COPD GOLD III  02/16/2014  . History of tobacco use 08/23/2013  . 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 CAD (coronary artery disease) 04/06/2013  . PVD - hx of Rt SFA PTA and s/p Rt 4-5th toe amp Dec 2014 04/05/2013  . Essential hypertension 11/25/2012  . Mixed hyperlipidemia 07/17/2008    Medications: reviewed and updated Current Outpatient Prescriptions  Medication Sig Dispense Refill  . allopurinol (ZYLOPRIM) 100 MG tablet Take 1 tablet (100 mg total) by mouth daily. 30 tablet 6  . amLODipine (NORVASC) 10 MG tablet Take 1 tablet (10 mg total) by mouth daily. 30 tablet 6  . aspirin 81 MG chewable tablet Chew 81 mg by mouth daily.    Marland Kitchen atorvastatin (LIPITOR) 40 MG tablet Take 40 mg by mouth daily.    . Blood Glucose Monitoring Suppl (ONETOUCH VERIO FLEX SYSTEM) w/Device KIT 1 kit by Does not apply route daily. 1 kit 3  . carvedilol (COREG) 25 MG tablet Take 2 tablets (50 mg total) by mouth 2 (two) times daily with a meal. 120 tablet 3  . cloNIDine (CATAPRES) 0.2 MG tablet TAKE TWO (2) TABLETS BY MOUTH 2 TIMES DAILY 120 tablet 3  . doxazosin (CARDURA) 8 MG tablet Take 1 tablet (8 mg total) by mouth 2 (two) times daily. 60 tablet 6  . ferrous sulfate 325 (65 FE) MG tablet TAKE ONE (  1) TABLET BY MOUTH TWO (2) TIMES DAILY WITH A MEAL 60 tablet 3  . glucose blood (ONETOUCH VERIO) test strip Use as instructed to test three times daily. ICD-10 code: E11.22 100 each 12  . hydrALAZINE (APRESOLINE) 100 MG tablet TAKE ONE TABLET BY MOUTH EVERY EIGHT HOURS 90 tablet 0  . insulin aspart (NOVOLOG FLEXPEN) 100 UNIT/ML FlexPen Inject 15 Units into the skin 2 (two) times daily.     . Insulin Glargine (LANTUS SOLOSTAR) 100 UNIT/ML Solostar Pen Inject 50 Units into the skin every morning. 15 mL 3  . isosorbide mononitrate (IMDUR) 60 MG 24 hr tablet Take 60 mg by mouth daily.    . Lancets (ONETOUCH ULTRASOFT) lancets Use as instructed  100 each 12  . levothyroxine (SYNTHROID, LEVOTHROID) 50 MCG tablet Take 50 mcg by mouth daily before breakfast.    . nitroGLYCERIN (NITROSTAT) 0.4 MG SL tablet Place 1 tablet (0.4 mg total) under the tongue every 5 (five) minutes as needed for chest pain. 30 tablet 12  . ONETOUCH DELICA LANCETS 51O MISC 1 each by Does not apply route 3 (three) times daily. ICD-10 code: E11.22 100 each 12  . oxyCODONE-acetaminophen (PERCOCET) 5-325 MG tablet Take 2 tablets by mouth every 4 (four) hours as needed. 20 tablet 0  . pantoprazole (PROTONIX) 40 MG tablet TAKE ONE (1) TABLET BY MOUTH EVERY DAY 90 tablet 0  . potassium chloride SA (K-DUR,KLOR-CON) 20 MEQ tablet Take 20 mEq by mouth daily.    Marland Kitchen torsemide (DEMADEX) 20 MG tablet Take 3 tablets (60 mg total) by mouth daily. May take an extra 20 mg Daily for weight gain above 212 lbs 90 tablet 6  . traMADol (ULTRAM) 50 MG tablet Take 1 tablet (50 mg total) by mouth every 8 (eight) hours as needed. 30 tablet 0  . warfarin (COUMADIN) 5 MG tablet Take 2.5-5 mg by mouth See admin instructions. Take 2.5 mg (1/2 tablet) daily except take 5 mg (1 tablet) on Monday and Friday     No current facility-administered medications for this visit.     Social Hx:  reports that he quit smoking about 3 years ago. His smoking use included Cigarettes. He started smoking about 49 years ago. He has a 46.00 pack-year smoking history. He has never used smokeless tobacco.   Objective:   BP 138/60   Pulse 68   Temp 98.6 F (37 C) (Oral)   Ht 5' 8" (1.727 m)   Wt 199 lb (90.3 kg)   SpO2 98%   BMI 30.26 kg/m  Physical Exam  Gen: NAD, alert, cooperative with exam, well-appearing Cardiac: Regular rate and rhythm, normal S1/S2,  1+ pitting edema in left ankle, no edema on right, capillary refill brisk  Respiratory: Clear to auscultation bilaterally, no wheezes, non-labored breathing Psych: good insight, normal mood and affect  Assessment & Plan:  Type 2 diabetes mellitus with  stage 3 chronic kidney disease, with long-term current use of insulin (HCC) Poorly controlled. His hemoglobin A1c 11. Patient continues not to check his glucose regularly. Does not give specific reason for not checking his glucose, does not identify any barriers. Unclear what his glucoses at home and therefore it is difficult to titrate his insulin appropriately. -Discussed importance of checking glucose at least daily -Continue Lantus 50 units every morning and NovoLog 15 units twice daily for now -Patient will schedule follow-up with Dr. Valentina Lucks for possible continuous glucose monitoring so we can adjust his insulin appropriately -Follow-up with me in 1 month  Left foot pain Likely residual gout pain vs pain from hyperkeratotic lesion on left foot vs possible arthritic pain. No hx of trauma to that area - 30 pills of Tramadol with no refills provided - Cannot take NSAIDS due to CAD - Follow up in 1 month if symptoms not improved. May consider sports medicine referral if no improvement vs treating again for gout  Smitty Cords, MD [place], PGY-3

## 2016-11-14 ENCOUNTER — Ambulatory Visit (INDEPENDENT_AMBULATORY_CARE_PROVIDER_SITE_OTHER): Payer: Medicare Other | Admitting: Family Medicine

## 2016-11-14 ENCOUNTER — Other Ambulatory Visit (HOSPITAL_COMMUNITY): Payer: Self-pay | Admitting: Student

## 2016-11-14 ENCOUNTER — Encounter: Payer: Self-pay | Admitting: Family Medicine

## 2016-11-14 ENCOUNTER — Other Ambulatory Visit: Payer: Self-pay | Admitting: Family Medicine

## 2016-11-14 DIAGNOSIS — M79672 Pain in left foot: Secondary | ICD-10-CM | POA: Diagnosis not present

## 2016-11-14 DIAGNOSIS — N183 Chronic kidney disease, stage 3 (moderate): Secondary | ICD-10-CM | POA: Diagnosis not present

## 2016-11-14 DIAGNOSIS — I779 Disorder of arteries and arterioles, unspecified: Secondary | ICD-10-CM

## 2016-11-14 DIAGNOSIS — Z794 Long term (current) use of insulin: Secondary | ICD-10-CM

## 2016-11-14 DIAGNOSIS — E785 Hyperlipidemia, unspecified: Secondary | ICD-10-CM

## 2016-11-14 DIAGNOSIS — E1122 Type 2 diabetes mellitus with diabetic chronic kidney disease: Secondary | ICD-10-CM

## 2016-11-14 MED ORDER — NITROGLYCERIN 0.4 MG SL SUBL: 0.4000 mg | SUBLINGUAL_TABLET | SUBLINGUAL | 12 refills | Status: AC | PRN

## 2016-11-14 MED ORDER — TRAMADOL HCL 50 MG PO TABS: 50.0000 mg | ORAL_TABLET | Freq: Three times a day (TID) | ORAL | 0 refills | Status: DC | PRN

## 2016-11-14 NOTE — Assessment & Plan Note (Signed)
Likely residual gout pain vs pain from hyperkeratotic lesion on left foot vs possible arthritic pain. No hx of trauma to that area - 30 pills of Tramadol with no refills provided - Cannot take NSAIDS due to CAD - Follow up in 1 month if symptoms not improved. May consider sports medicine referral if no improvement vs treating again for gout

## 2016-11-14 NOTE — Assessment & Plan Note (Signed)
Poorly controlled. His hemoglobin A1c 11. Patient continues not to check his glucose regularly. Does not give specific reason for not checking his glucose, does not identify any barriers. Unclear what his glucoses at home and therefore it is difficult to titrate his insulin appropriately. -Discussed importance of checking glucose at least daily -Continue Lantus 50 units every morning and NovoLog 15 units twice daily for now -Patient will schedule follow-up with Dr. Valentina Lucks for possible continuous glucose monitoring so we can adjust his insulin appropriately -Follow-up with me in 1 month

## 2016-11-14 NOTE — Patient Instructions (Addendum)
Thank you for coming in today, it was so nice to see you! Today we talked about:    Diabetes: please continue to try and check your glucose twice daily and keep a log. Take your insulin the same as you are now.   Continue to take Tramadol for your ankle. I'd like to see you again in 1 month for this if it continues to hurt you.   Please follow up within the next 1-2 weeks with Dr. Valentina Lucks for continuous glucose monitoring. You can schedule this appointment at the front desk before you leave or call the clinic.  Bring in all your medications or supplements to each appointment for review.   If we ordered any tests today, you will be notified via telephone of any abnormalities. If everything is normal you will get a letter in the mail.   If you have any questions or concerns, please do not hesitate to call the office at [phone]. You can also message me directly via MyChart.   Sincerely,  Smitty Cords, MD

## 2016-11-18 ENCOUNTER — Other Ambulatory Visit (HOSPITAL_COMMUNITY): Payer: Self-pay
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
## 2016-11-20 ENCOUNTER — Telehealth: Payer: Self-pay | Admitting: Hematology and Oncology

## 2016-11-20 ENCOUNTER — Ambulatory Visit: Payer: Medicare Other

## 2016-11-20 ENCOUNTER — Other Ambulatory Visit (HOSPITAL_BASED_OUTPATIENT_CLINIC_OR_DEPARTMENT_OTHER): Payer: Medicare Other

## 2016-11-20 ENCOUNTER — Encounter: Payer: Self-pay | Admitting: Hematology and Oncology

## 2016-11-20 ENCOUNTER — Ambulatory Visit (HOSPITAL_BASED_OUTPATIENT_CLINIC_OR_DEPARTMENT_OTHER): Payer: Medicare Other | Admitting: Hematology and Oncology

## 2016-11-20 DIAGNOSIS — D631 Anemia in chronic kidney disease: Secondary | ICD-10-CM

## 2016-11-20 DIAGNOSIS — I509 Heart failure, unspecified: Secondary | ICD-10-CM

## 2016-11-20 DIAGNOSIS — D539 Nutritional anemia, unspecified: Secondary | ICD-10-CM

## 2016-11-20 DIAGNOSIS — N184 Chronic kidney disease, stage 4 (severe): Secondary | ICD-10-CM | POA: Diagnosis not present

## 2016-11-20 DIAGNOSIS — Z7901 Long term (current) use of anticoagulants: Secondary | ICD-10-CM

## 2016-11-20 DIAGNOSIS — N189 Chronic kidney disease, unspecified: Principal | ICD-10-CM

## 2016-11-20 DIAGNOSIS — I5022 Chronic systolic (congestive) heart failure: Secondary | ICD-10-CM

## 2016-11-20 LAB — CBC WITH DIFFERENTIAL/PLATELET
BASO%: 0.5 % (ref 0.0–2.0)
Basophils Absolute: 0 10*3/uL (ref 0.0–0.1)
EOS ABS: 0.1 10*3/uL (ref 0.0–0.5)
EOS%: 1.4 % (ref 0.0–7.0)
HCT: 26.5 % — ABNORMAL LOW (ref 38.4–49.9)
HEMOGLOBIN: 8.3 g/dL — AB (ref 13.0–17.1)
LYMPH%: 9.4 % — ABNORMAL LOW (ref 14.0–49.0)
MCH: 23.2 pg — ABNORMAL LOW (ref 27.2–33.4)
MCHC: 31.5 g/dL — ABNORMAL LOW (ref 32.0–36.0)
MCV: 73.6 fL — ABNORMAL LOW (ref 79.3–98.0)
MONO#: 0.6 10*3/uL (ref 0.1–0.9)
MONO%: 6.7 % (ref 0.0–14.0)
NEUT%: 82 % — ABNORMAL HIGH (ref 39.0–75.0)
NEUTROS ABS: 7.3 10*3/uL — AB (ref 1.5–6.5)
Platelets: 287 10*3/uL (ref 140–400)
RBC: 3.6 10*6/uL — ABNORMAL LOW (ref 4.20–5.82)
RDW: 22.6 % — AB (ref 11.0–14.6)
WBC: 8.9 10*3/uL (ref 4.0–10.3)
lymph#: 0.8 10*3/uL — ABNORMAL LOW (ref 0.9–3.3)

## 2016-11-20 MED ORDER — DARBEPOETIN ALFA 500 MCG/ML IJ SOSY
500.0000 ug | PREFILLED_SYRINGE | Freq: Once | INTRAMUSCULAR | Status: DC
  Filled 2016-11-20: qty 1

## 2016-11-20 NOTE — Assessment & Plan Note (Signed)
The patient has multifactorial anemia He had combined iron deficiency anemia along with anemia chronic kidney disease He is on maximum dose of darbepoetin and had received intravenous iron infusion recently Repeat iron studies are pending If iron studies are adequate, with persistent anemia requiring blood transfusion, I think it is prudent to consider bone marrow biopsy to exclude bone marrow malignancy I will call him after test results of iron studies are available If it is adequate, we will proceed with bone marrow biopsy If iron studies showed persistent iron deficiency anemia, I will cancel his bone marrow biopsy and give him intravenous iron infusion instead The risks, benefits, side effects of bone marrow aspirate and biopsy is fully discussed with the patient and he agreed to proceed Today, due to lack of symptoms, we will proceed with darbepoetin injection only.  He does not need blood transfusion today

## 2016-11-20 NOTE — Progress Notes (Signed)
[facility] OFFICE PROGRESS NOTE  Leonard Dolly, MD SUMMARY OF HEMATOLOGIC HISTORY:  Leonard [name] is here because of progressive anemia, renal failure and MGUS. I reviewed reports from his nephrologist office. The patient have significant peripheral vascular disease, diabetes and hypertension. Early this year, he had baseline creatinine of around 1.3 and subsequently developed heart failure. He was placed on aggressive medication/regimen and subsequently was noted to have progressive renal failure and anemia. According to his blood work dated 01/03/2015, white blood cell count is at 6.6, hemoglobin 7.1, platelet count of 153, creatinine of 2.4, calcium of 8.9, MCV 76, iron saturation 15%, ferritin 180, serum vitamin B-12 of 360. Serum protein electrophoresis initially detected M spike but subsequently not quantifiable. Urine immunofixation showed Bence-Jones Proteinuria. Echocardiogram show preserved ejection fraction 55-60% but with evidence of diastolic heart failure. He denies history of abnormal bone pain or bone fracture. Patient denies history of recurrent infection or atypical infections such as shingles of meningitis. Denies chills, night sweats, anorexia or abnormal weight loss. He complained of fatigue and shortness of breath on minimal exertion. The patient denies any recent signs or symptoms of bleeding such as spontaneous hematuria or hematochezia. He has occasional epistaxis when he blows his nose. He does not have spontaneous epistaxis Evaluation in December 2016 show he had anemia chronic disease and MGUS  On 02/01/2015, he is started on Aranesp, 200 g every 2 weeks to keep hemoglobin greater than 11 Between December to June 2017, the dose of Aranesp is titrated to maximum 500 g From February 2018, he received intermittent intravenous iron infusion along with several units of blood transfusion  INTERVAL HISTORY: Leonard [name] 66 y.o. male returns  for further follow-up. He denies chest pain or shortness of breath No recent infection The patient denies any recent signs or symptoms of bleeding such as spontaneous epistaxis, hematuria or hematochezia.  I have reviewed the past medical history, past surgical history, social history and family history with the patient and they are unchanged from previous note.  ALLERGIES:  is allergic to no known allergies.  MEDICATIONS:  Current Outpatient Prescriptions  Medication Sig Dispense Refill  . allopurinol (ZYLOPRIM) 100 MG tablet Take 1 tablet (100 mg total) by mouth daily. 30 tablet 6  . amLODipine (NORVASC) 10 MG tablet Take 1 tablet (10 mg total) by mouth daily. 30 tablet 6  . aspirin 81 MG chewable tablet Chew 81 mg by mouth daily.    Marland Kitchen atorvastatin (LIPITOR) 40 MG tablet Take 40 mg by mouth daily.    Marland Kitchen atorvastatin (LIPITOR) 40 MG tablet TAKE ONE (1) TABLET BY MOUTH EVERY DAY AT 6:00PM 90 tablet 3  . Blood Glucose Monitoring Suppl (ONETOUCH VERIO FLEX SYSTEM) w/Device KIT 1 kit by Does not apply route daily. 1 kit 3  . carvedilol (COREG) 25 MG tablet Take 2 tablets (50 mg total) by mouth 2 (two) times daily with a meal. 120 tablet 3  . cloNIDine (CATAPRES) 0.2 MG tablet TAKE TWO (2) TABLETS BY MOUTH 2 TIMES DAILY 120 tablet 3  . doxazosin (CARDURA) 8 MG tablet Take 1 tablet (8 mg total) by mouth 2 (two) times daily. 60 tablet 6  . ferrous sulfate 325 (65 FE) MG tablet TAKE ONE (1) TABLET BY MOUTH TWO (2) TIMES DAILY WITH A MEAL 60 tablet 3  . glucose blood (ONETOUCH VERIO) test strip Use as instructed to test three times daily. ICD-10 code: E11.22 100 each 12  . hydrALAZINE (APRESOLINE) 100  MG tablet TAKE ONE TABLET BY MOUTH EVERY EIGHT HOURS 90 tablet 1  . insulin aspart (NOVOLOG FLEXPEN) 100 UNIT/ML FlexPen Inject 15 Units into the skin 2 (two) times daily.     . Insulin Glargine (LANTUS SOLOSTAR) 100 UNIT/ML Solostar Pen Inject 50 Units into the skin every morning. 15 mL 3  .  isosorbide mononitrate (IMDUR) 60 MG 24 hr tablet Take 60 mg by mouth daily.    . Lancets (ONETOUCH ULTRASOFT) lancets Use as instructed 100 each 12  . levothyroxine (SYNTHROID, LEVOTHROID) 50 MCG tablet Take 50 mcg by mouth daily before breakfast.    . nitroGLYCERIN (NITROSTAT) 0.4 MG SL tablet Place 1 tablet (0.4 mg total) under the tongue every 5 (five) minutes as needed for chest pain. 30 tablet 12  . ONETOUCH DELICA LANCETS 27O MISC 1 each by Does not apply route 3 (three) times daily. ICD-10 code: E11.22 100 each 12  . oxyCODONE-acetaminophen (PERCOCET) 5-325 MG tablet Take 2 tablets by mouth every 4 (four) hours as needed. 20 tablet 0  . pantoprazole (PROTONIX) 40 MG tablet TAKE ONE (1) TABLET BY MOUTH EVERY DAY 90 tablet 0  . potassium chloride SA (K-DUR,KLOR-CON) 20 MEQ tablet Take 20 mEq by mouth daily.    Marland Kitchen torsemide (DEMADEX) 20 MG tablet Take 3 tablets (60 mg total) by mouth daily. May take an extra 20 mg Daily for weight gain above 212 lbs 90 tablet 6  . traMADol (ULTRAM) 50 MG tablet Take 1 tablet (50 mg total) by mouth every 8 (eight) hours as needed. 30 tablet 0  . warfarin (COUMADIN) 5 MG tablet Take 2.5-5 mg by mouth See admin instructions. Take 2.5 mg (1/2 tablet) daily except take 5 mg (1 tablet) on Monday and Friday     No current facility-administered medications for this visit.      REVIEW OF SYSTEMS:   Constitutional: Denies fevers, chills or night sweats Eyes: Denies blurriness of vision Ears, nose, mouth, throat, and face: Denies mucositis or sore throat Respiratory: Denies cough, dyspnea or wheezes Cardiovascular: Denies palpitation, chest discomfort or lower extremity swelling Gastrointestinal:  Denies nausea, heartburn or change in bowel habits Skin: Denies abnormal skin rashes Lymphatics: Denies new lymphadenopathy or easy bruising Neurological:Denies numbness, tingling or new weaknesses Behavioral/Psych: Mood is stable, no new changes  All other systems were  reviewed with the patient and are negative.  PHYSICAL EXAMINATION: ECOG PERFORMANCE STATUS: 1 - Symptomatic but completely ambulatory  Vitals:   11/20/16 1520  BP: (!) 111/54  Pulse: 71  Resp: 18  Temp: 98.8 F (37.1 C)  SpO2: 100%   Filed Weights   11/20/16 1520  Weight: 204 lb 4.8 oz (92.7 kg)    GENERAL:alert, no distress and comfortable SKIN: skin color, texture, turgor are normal, no rashes or significant lesions EYES: normal, Conjunctiva are pink and non-injected, sclera clear Musculoskeletal:no cyanosis of digits and no clubbing  NEURO: alert & oriented x 3 with fluent speech, no focal motor/sensory deficits  LABORATORY DATA:  I have reviewed the data as listed     Component Value Date/Time   NA 129 (L) 09/29/2016 1525   NA CANCELED 07/01/2016 1434   NA 140 08/16/2015 1554   K 4.3 09/29/2016 1525   K 3.8 08/16/2015 1554   CL 99 (L) 09/29/2016 1525   CO2 20 (L) 09/29/2016 1525   CO2 21 (L) 08/16/2015 1554   GLUCOSE 401 (H) 09/29/2016 1525   GLUCOSE 133 08/16/2015 1554   BUN 56 (H) 09/29/2016  1525   BUN CANCELED 07/01/2016 1434   BUN 51.0 (H) 08/16/2015 1554   CREATININE 2.77 (H) 09/29/2016 1525   CREATININE 3.64 (H) 01/09/2016 1606   CREATININE 2.7 (H) 08/16/2015 1554   CALCIUM 8.2 (L) 09/29/2016 1525   CALCIUM NOPLAS 10/24/2015 1415   CALCIUM 8.9 08/16/2015 1554   PROT 6.9 08/30/2016 1103   PROT 7.0 08/16/2015 1554   ALBUMIN 3.0 (L) 08/30/2016 1103   ALBUMIN 3.4 (L) 08/16/2015 1554   AST 9 (L) 08/30/2016 1103   AST 17 08/16/2015 1554   ALT 7 (L) 08/30/2016 1103   ALT 13 08/16/2015 1554   ALKPHOS 83 08/30/2016 1103   ALKPHOS 89 08/16/2015 1554   BILITOT 0.8 08/30/2016 1103   BILITOT 0.30 08/16/2015 1554   GFRNONAA 22 (L) 09/29/2016 1525   GFRNONAA 27 (L) 07/20/2015 0949   GFRAA 26 (L) 09/29/2016 1525   GFRAA 31 (L) 07/20/2015 0949    No results found for: SPEP, UPEP  Lab Results  Component Value Date   WBC 8.9 11/20/2016   NEUTROABS 7.3  (H) 11/20/2016   HGB 8.3 (L) 11/20/2016   HCT 26.5 (L) 11/20/2016   MCV 73.6 (L) 11/20/2016   PLT 287 11/20/2016      Chemistry      Component Value Date/Time   NA 129 (L) 09/29/2016 1525   NA CANCELED 07/01/2016 1434   NA 140 08/16/2015 1554   K 4.3 09/29/2016 1525   K 3.8 08/16/2015 1554   CL 99 (L) 09/29/2016 1525   CO2 20 (L) 09/29/2016 1525   CO2 21 (L) 08/16/2015 1554   BUN 56 (H) 09/29/2016 1525   BUN CANCELED 07/01/2016 1434   BUN 51.0 (H) 08/16/2015 1554   CREATININE 2.77 (H) 09/29/2016 1525   CREATININE 3.64 (H) 01/09/2016 1606   CREATININE 2.7 (H) 08/16/2015 1554      Component Value Date/Time   CALCIUM 8.2 (L) 09/29/2016 1525   CALCIUM NOPLAS 10/24/2015 1415   CALCIUM 8.9 08/16/2015 1554   ALKPHOS 83 08/30/2016 1103   ALKPHOS 89 08/16/2015 1554   AST 9 (L) 08/30/2016 1103   AST 17 08/16/2015 1554   ALT 7 (L) 08/30/2016 1103   ALT 13 08/16/2015 1554   BILITOT 0.8 08/30/2016 1103   BILITOT 0.30 08/16/2015 1554      ASSESSMENT & PLAN:  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) The patient has multifactorial anemia He had combined iron deficiency anemia along with anemia chronic kidney disease He is on maximum dose of darbepoetin and had received intravenous iron infusion recently Repeat iron studies are pending If iron studies are adequate, with persistent anemia requiring blood transfusion, I think it is prudent to consider bone marrow biopsy to exclude bone marrow malignancy I will call him after test results of iron studies are available If it is adequate, we will proceed with bone marrow biopsy If iron studies showed persistent iron deficiency anemia, I will cancel his bone marrow biopsy and give him intravenous iron infusion instead The risks, benefits, side effects of bone marrow aspirate and biopsy is fully discussed with the patient and he agreed to proceed Today, due to lack of symptoms, we will proceed with  darbepoetin injection only.  He does not need blood transfusion today  CHF (congestive heart failure) (HCC) The patient has intermittent congestive heart failure could be precipitated by severe anemia I will continue transfusion to keep hemoglobin greater than 8 He will continue medical management He  is on chronic anticoagulation therapy but he denies recent bleeding   No orders of the defined types were placed in this encounter.   All questions were answered. The patient knows to call the clinic with any problems, questions or concerns. No barriers to learning was detected.  I spent 15 minutes counseling the patient face to face. The total time spent in the appointment was 20 minutes and more than 50% was on counseling.     Heath Lark, MD 10/18/20183:39 PM

## 2016-11-20 NOTE — Telephone Encounter (Signed)
Gave avs and calendar for October and November  °

## 2016-11-20 NOTE — Assessment & Plan Note (Signed)
The patient has intermittent congestive heart failure could be precipitated by severe anemia I will continue transfusion to keep hemoglobin greater than 8 He will continue medical management He is on chronic anticoagulation therapy but he denies recent bleeding

## 2016-11-20 NOTE — Progress Notes (Signed)
Pt was not able to be located in [facility] facility for injection appt. [name] nurse Hassan [place]) was made aware and this nurse and [name] nurse attempted to locate pt. Sister [name]) was called and stated, "he's not here, he's at the doctor." Made sister aware that appt was missed and when he get's home, to call [name] nurse during business hours or call in the a.m. Laureen Frederic LPN

## 2016-11-21 ENCOUNTER — Other Ambulatory Visit: Payer: Self-pay | Admitting: Hematology and Oncology

## 2016-11-21 ENCOUNTER — Ambulatory Visit (HOSPITAL_BASED_OUTPATIENT_CLINIC_OR_DEPARTMENT_OTHER): Payer: Medicare Other

## 2016-11-21 ENCOUNTER — Telehealth: Payer: Self-pay

## 2016-11-21 VITALS — BP 126/53 | HR 76 | Temp 97.5°F | Resp 20

## 2016-11-21 DIAGNOSIS — N189 Chronic kidney disease, unspecified: Principal | ICD-10-CM

## 2016-11-21 DIAGNOSIS — D631 Anemia in chronic kidney disease: Secondary | ICD-10-CM | POA: Diagnosis not present

## 2016-11-21 DIAGNOSIS — N184 Chronic kidney disease, stage 4 (severe): Secondary | ICD-10-CM | POA: Diagnosis present

## 2016-11-21 LAB — IRON AND TIBC
%SAT: 13 % — ABNORMAL LOW (ref 20–55)
IRON: 29 ug/dL — AB (ref 42–163)
TIBC: 221 ug/dL (ref 202–409)
UIBC: 192 ug/dL (ref 117–376)

## 2016-11-21 LAB — FERRITIN: Ferritin: 510 ng/ml — ABNORMAL HIGH (ref 22–316)

## 2016-11-21 MED ORDER — DARBEPOETIN ALFA 500 MCG/ML IJ SOSY
500.0000 ug | PREFILLED_SYRINGE | Freq: Once | INTRAMUSCULAR | Status: AC
  Administered 2016-11-21: 500 ug via SUBCUTANEOUS
  Filled 2016-11-21: qty 1

## 2016-11-21 NOTE — Patient Instructions (Signed)

## 2016-11-21 NOTE — Telephone Encounter (Signed)
Called patient regarding missed injection appointment yesterday. States that he forgot. Will come in at 2 pm today for injection. Verbalized understanding.

## 2016-11-24 ENCOUNTER — Telehealth: Payer: Self-pay | Admitting: Hematology and Oncology

## 2016-11-24 NOTE — Telephone Encounter (Signed)
Spoke with patient re 10/26 appointments.

## 2016-11-25 ENCOUNTER — Other Ambulatory Visit (HOSPITAL_COMMUNITY): Payer: Self-pay

## 2016-11-25 ENCOUNTER — Ambulatory Visit (INDEPENDENT_AMBULATORY_CARE_PROVIDER_SITE_OTHER): Payer: Medicare Other | Admitting: *Deleted

## 2016-11-25 DIAGNOSIS — Z5181 Encounter for therapeutic drug level monitoring: Secondary | ICD-10-CM

## 2016-11-25 DIAGNOSIS — I82512 Chronic embolism and thrombosis of left femoral vein: Secondary | ICD-10-CM

## 2016-11-25 DIAGNOSIS — Z7901 Long term (current) use of anticoagulants: Secondary | ICD-10-CM

## 2016-11-25 LAB — POCT INR: INR: 1.9

## 2016-11-25 NOTE — Progress Notes (Signed)
Paramedicine Encounter    Patient ID: Leonard [name], male    DOB: August 06, 1950, 66 y.o.   MRN: [id number]    Patient Care Team: [name], MD as PCP - General (Family Medicine) [name], MD as Consulting Physician (Nephrology)  Patient Active Problem List   Diagnosis Date Noted  . Left foot pain 10/29/2016  . Long term (current) use of anticoagulants [Z79.01] 09/09/2016  . Chronic deep vein thrombosis (DVT) of femoral vein of left lower extremity ([place]) 09/03/2016  . Morbid obesity due to excess calories ([place]) 03/17/2016  . Anemia due to stage 4 chronic kidney disease treated with darbepoetin ([place]) 02/28/2016  . Fatigue associated with anemia 01/21/2016  . Diabetic retinopathy ([place]) 11/13/2015  . CHF (congestive heart failure) ([place]) 08/27/2015  . Iron deficiency anemia 08/03/2015  . Claudication ([place])   . Type 2 diabetes mellitus with stage 3 chronic kidney disease, with long-term current use of insulin ([place])   . Hematuria 06/27/2015  . Skin lesion of face 02/22/2015  . 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 Deficiency anemia 01/15/2015  . CKD (chronic kidney disease) stage 4, GFR 15-29 ml/min (HCC) 01/15/2015  . Hypothyroidism 11/22/2014  . COPD GOLD III  02/16/2014  . History of tobacco use 08/23/2013  . 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 CAD (coronary artery disease) 04/06/2013  . PVD - hx of Rt SFA PTA and s/p Rt 4-5th toe amp Dec 2014 04/05/2013  . Essential hypertension 11/25/2012  . Mixed hyperlipidemia 07/17/2008    Current Outpatient Prescriptions:  .  allopurinol (ZYLOPRIM) 100 MG tablet, Take 1 tablet (100 mg total) by mouth daily., Disp: 30 tablet, Rfl: 6 .  amLODipine (NORVASC) 10 MG tablet, Take 1 tablet (10 mg total) by mouth daily., Disp: 30 tablet, Rfl: 6 .  aspirin 81 MG chewable tablet, Chew 81 mg by mouth daily., Disp: , Rfl:  .  atorvastatin (LIPITOR) 40 MG tablet, Take 40 mg by mouth daily., Disp: , Rfl:  .   carvedilol (COREG) 25 MG tablet, Take 2 tablets (50 mg total) by mouth 2 (two) times daily with a meal., Disp: 120 tablet, Rfl: 3 .  cloNIDine (CATAPRES) 0.2 MG tablet, TAKE TWO (2) TABLETS BY MOUTH 2 TIMES DAILY, Disp: 120 tablet, Rfl: 3 .  doxazosin (CARDURA) 8 MG tablet, Take 1 tablet (8 mg total) by mouth 2 (two) times daily., Disp: 60 tablet, Rfl: 6 .  ferrous sulfate 325 (65 FE) MG tablet, TAKE ONE (1) TABLET BY MOUTH TWO (2) TIMES DAILY WITH A MEAL, Disp: 60 tablet, Rfl: 3 .  hydrALAZINE (APRESOLINE) 100 MG tablet, TAKE ONE TABLET BY MOUTH EVERY EIGHT HOURS, Disp: 90 tablet, Rfl: 1 .  isosorbide mononitrate (IMDUR) 60 MG 24 hr tablet, Take 60 mg by mouth daily., Disp: , Rfl:  .  levothyroxine (SYNTHROID, LEVOTHROID) 50 MCG tablet, Take 50 mcg by mouth daily before breakfast., Disp: , Rfl:  .  pantoprazole (PROTONIX) 40 MG tablet, TAKE ONE (1) TABLET BY MOUTH EVERY DAY, Disp: 90 tablet, Rfl: 0 .  potassium chloride SA (K-DUR,KLOR-CON) 20 MEQ tablet, Take 20 mEq by mouth daily., Disp: , Rfl:  .  atorvastatin (LIPITOR) 40 MG tablet, TAKE ONE (1) TABLET BY MOUTH EVERY DAY AT 6:00PM, Disp: 90 tablet, Rfl: 3 .  Blood Glucose Monitoring Suppl (ONETOUCH VERIO FLEX SYSTEM) w/Device KIT, 1 kit by Does not apply route daily., Disp: 1 kit, Rfl: 3 .  glucose blood (ONETOUCH VERIO) test strip, Use  as instructed to test three times daily. ICD-10 code: E11.22, Disp: 100 each, Rfl: 12 .  insulin aspart (NOVOLOG FLEXPEN) 100 UNIT/ML FlexPen, Inject 15 Units into the skin 2 (two) times daily. , Disp: , Rfl:  .  Insulin Glargine (LANTUS SOLOSTAR) 100 UNIT/ML Solostar Pen, Inject 50 Units into the skin every morning., Disp: 15 mL, Rfl: 3 .  Lancets (ONETOUCH ULTRASOFT) lancets, Use as instructed, Disp: 100 each, Rfl: 12 .  nitroGLYCERIN (NITROSTAT) 0.4 MG SL tablet, Place 1 tablet (0.4 mg total) under the tongue every 5 (five) minutes as needed for chest pain., Disp: 30 tablet, Rfl: 12 .  ONETOUCH DELICA LANCETS  03Y MISC, 1 each by Does not apply route 3 (three) times daily. ICD-10 code: E11.22, Disp: 100 each, Rfl: 12 .  oxyCODONE-acetaminophen (PERCOCET) 5-325 MG tablet, Take 2 tablets by mouth every 4 (four) hours as needed., Disp: 20 tablet, Rfl: 0 .  torsemide (DEMADEX) 20 MG tablet, Take 3 tablets (60 mg total) by mouth daily. May take an extra 20 mg Daily for weight gain above 212 lbs, Disp: 90 tablet, Rfl: 6 .  traMADol (ULTRAM) 50 MG tablet, Take 1 tablet (50 mg total) by mouth every 8 (eight) hours as needed., Disp: 30 tablet, Rfl: 0 .  warfarin (COUMADIN) 5 MG tablet, Take 2.5-5 mg by mouth See admin instructions. Take 2.5 mg (1/2 tablet) daily except take 5 mg (1 tablet) on Monday and Friday, Disp: , Rfl:  Allergies  Allergen Reactions  . No Known Allergies      Social History   Social History  . Marital status: Single    Spouse name: N/A  . Number of children: N/A  . Years of education: N/A   Occupational History  . Not on file.   Social History Main Topics  . Smoking status: Former Smoker    Packs/day: 1.00    Years: 46.00    Types: Cigarettes    Start date: 02/04/1967    Quit date: 02/03/2013  . Smokeless tobacco: Never Used     Comment: Doing well with quitting.  . Alcohol use No  . Drug use: No  . Sexual activity: Not Currently   Other Topics Concern  . Not on file   Social History Narrative   Lives with sister, [name] in [place].  Retired - former Sports coach.    Physical Exam  Pulmonary/Chest: No respiratory distress.  Abdominal: He exhibits no distension.  Skin: Skin is warm and dry. He is not diaphoretic.        Future Appointments Date Time Provider Makena  11/28/2016 12:00 PM [facility] A1 [facility] None  12/01/2016 10:30 AM Leavy Cella, [facility] [place]  12/02/2016 10:00 AM [facility] PA/NP [facility] None  12/03/2016 8:00 AM [facility] PROCEDURE 1 [facility] None  12/03/2016 9:00 AM [facility] LAB ONLY [facility] None   12/03/2016 1:00 PM [facility] G24 [facility] None  12/09/2016 10:45 AM [facility]  12/18/2016 1:00 PM [facility] LAB 6 [facility] None  12/18/2016 1:30 PM Gorsuch, Ni, MD [facility] None  12/18/2016 2:00 PM [facility] INJ NURSE [facility] None  12/19/2016 2:00 PM [facility] HS VASC 3 [facility]  12/19/2016 2:45 PM Nickel, Sharmon Leyden, NP [facility]  02/04/2017 10:15 AM Gardiner Barefoot, DPM [facility]    ATF pt CAO x4 sitting in the recliner with no complaints. Pt took his afternoon meds this am prior to going to get his coumadin levels checked.  Pt denies bleeding or having blood  in his stool.  Pt missed Sunday morning and all of the afternoon meds for this week.  Pt ate potatoe chips prior to todays [facility] visit.  Pt was reminder of the low sodium diet. Left leg swollen due to gout and blood clot in his left leg.  Pt denies headache, sob and dizziness. rx bottles and pill box refilled.   **warfarin placed according to anti-coag visit (11/25/16)  BP (!) 190/100   Pulse 82   Resp 16   Wt 197 lb (89.4 kg)   SpO2 97%   BMI 29.95 kg/m  CBG 220 Weight yesterday-didn't weigh Last visit weight-197    Sebert Stollings, EMT Paramedic 11/25/2016    ACTION: Home visit completed

## 2016-11-28 ENCOUNTER — Ambulatory Visit: Payer: Medicaid Other

## 2016-12-01 ENCOUNTER — Encounter: Payer: Self-pay | Admitting: Pharmacist

## 2016-12-01 ENCOUNTER — Emergency Department (HOSPITAL_COMMUNITY): Admit: 2016-12-01 | Discharge: 2016-12-01 | Disposition: A | Discharge status: Home / Self Care | Payer: Medicaid Other

## 2016-12-01 ENCOUNTER — Inpatient Hospital Stay (HOSPITAL_COMMUNITY)
Admission: EM | Admit: 2016-12-01 | Discharge: 2016-12-04 | DRG: 540 | Disposition: A | Discharge status: Home Health | Payer: Medicaid Other | Attending: Family Medicine | Admitting: Family Medicine

## 2016-12-01 ENCOUNTER — Ambulatory Visit (INDEPENDENT_AMBULATORY_CARE_PROVIDER_SITE_OTHER): Payer: Medicare Other | Admitting: Pharmacist

## 2016-12-01 ENCOUNTER — Encounter (HOSPITAL_COMMUNITY): Payer: Self-pay

## 2016-12-01 ENCOUNTER — Emergency Department (HOSPITAL_COMMUNITY): Payer: Medicaid Other

## 2016-12-01 DIAGNOSIS — M869 Osteomyelitis, unspecified: Secondary | ICD-10-CM | POA: Diagnosis not present

## 2016-12-01 DIAGNOSIS — D472 Monoclonal gammopathy: Secondary | ICD-10-CM | POA: Diagnosis present

## 2016-12-01 DIAGNOSIS — Z95820 Peripheral vascular angioplasty status with implants and grafts: Secondary | ICD-10-CM

## 2016-12-01 DIAGNOSIS — D509 Iron deficiency anemia, unspecified: Secondary | ICD-10-CM | POA: Diagnosis present

## 2016-12-01 DIAGNOSIS — Z951 Presence of aortocoronary bypass graft: Secondary | ICD-10-CM | POA: Diagnosis not present

## 2016-12-01 DIAGNOSIS — E1169 Type 2 diabetes mellitus with other specified complication: Secondary | ICD-10-CM | POA: Diagnosis present

## 2016-12-01 DIAGNOSIS — I82512 Chronic embolism and thrombosis of left femoral vein: Secondary | ICD-10-CM | POA: Diagnosis present

## 2016-12-01 DIAGNOSIS — E11319 Type 2 diabetes mellitus with unspecified diabetic retinopathy without macular edema: Secondary | ICD-10-CM | POA: Diagnosis present

## 2016-12-01 DIAGNOSIS — E118 Type 2 diabetes mellitus with unspecified complications: Secondary | ICD-10-CM

## 2016-12-01 DIAGNOSIS — Z79899 Other long term (current) drug therapy: Secondary | ICD-10-CM

## 2016-12-01 DIAGNOSIS — I779 Disorder of arteries and arterioles, unspecified: Secondary | ICD-10-CM | POA: Diagnosis not present

## 2016-12-01 DIAGNOSIS — M7989 Other specified soft tissue disorders: Secondary | ICD-10-CM | POA: Diagnosis present

## 2016-12-01 DIAGNOSIS — E1151 Type 2 diabetes mellitus with diabetic peripheral angiopathy without gangrene: Secondary | ICD-10-CM | POA: Diagnosis present

## 2016-12-01 DIAGNOSIS — Z794 Long term (current) use of insulin: Secondary | ICD-10-CM

## 2016-12-01 DIAGNOSIS — Z87891 Personal history of nicotine dependence: Secondary | ICD-10-CM | POA: Diagnosis not present

## 2016-12-01 DIAGNOSIS — E785 Hyperlipidemia, unspecified: Secondary | ICD-10-CM | POA: Diagnosis present

## 2016-12-01 DIAGNOSIS — I10 Essential (primary) hypertension: Secondary | ICD-10-CM | POA: Diagnosis present

## 2016-12-01 DIAGNOSIS — M86172 Other acute osteomyelitis, left ankle and foot: Principal | ICD-10-CM

## 2016-12-01 DIAGNOSIS — Z89421 Acquired absence of other right toe(s): Secondary | ICD-10-CM | POA: Diagnosis not present

## 2016-12-01 DIAGNOSIS — M109 Gout, unspecified: Secondary | ICD-10-CM | POA: Diagnosis present

## 2016-12-01 DIAGNOSIS — E039 Hypothyroidism, unspecified: Secondary | ICD-10-CM | POA: Diagnosis present

## 2016-12-01 DIAGNOSIS — E1140 Type 2 diabetes mellitus with diabetic neuropathy, unspecified: Secondary | ICD-10-CM | POA: Diagnosis present

## 2016-12-01 DIAGNOSIS — J449 Chronic obstructive pulmonary disease, unspecified: Secondary | ICD-10-CM | POA: Diagnosis present

## 2016-12-01 DIAGNOSIS — I1A0 Resistant hypertension: Secondary | ICD-10-CM | POA: Diagnosis present

## 2016-12-01 DIAGNOSIS — I739 Peripheral vascular disease, unspecified: Secondary | ICD-10-CM | POA: Diagnosis present

## 2016-12-01 DIAGNOSIS — M1A0720 Idiopathic chronic gout, left ankle and foot, without tophus (tophi): Secondary | ICD-10-CM | POA: Diagnosis not present

## 2016-12-01 DIAGNOSIS — I2510 Atherosclerotic heart disease of native coronary artery without angina pectoris: Secondary | ICD-10-CM | POA: Diagnosis not present

## 2016-12-01 DIAGNOSIS — N183 Chronic kidney disease, stage 3 (moderate): Secondary | ICD-10-CM

## 2016-12-01 DIAGNOSIS — N184 Chronic kidney disease, stage 4 (severe): Secondary | ICD-10-CM | POA: Diagnosis present

## 2016-12-01 DIAGNOSIS — E1165 Type 2 diabetes mellitus with hyperglycemia: Secondary | ICD-10-CM | POA: Diagnosis present

## 2016-12-01 DIAGNOSIS — Z86718 Personal history of other venous thrombosis and embolism: Secondary | ICD-10-CM

## 2016-12-01 DIAGNOSIS — Z7901 Long term (current) use of anticoagulants: Secondary | ICD-10-CM

## 2016-12-01 DIAGNOSIS — I25810 Atherosclerosis of coronary artery bypass graft(s) without angina pectoris: Secondary | ICD-10-CM | POA: Diagnosis present

## 2016-12-01 DIAGNOSIS — E1122 Type 2 diabetes mellitus with diabetic chronic kidney disease: Secondary | ICD-10-CM

## 2016-12-01 DIAGNOSIS — I130 Hypertensive heart and chronic kidney disease with heart failure and stage 1 through stage 4 chronic kidney disease, or unspecified chronic kidney disease: Secondary | ICD-10-CM | POA: Diagnosis present

## 2016-12-01 DIAGNOSIS — I509 Heart failure, unspecified: Secondary | ICD-10-CM

## 2016-12-01 DIAGNOSIS — Z7982 Long term (current) use of aspirin: Secondary | ICD-10-CM

## 2016-12-01 LAB — I-STAT TROPONIN, ED: TROPONIN I, POC: 0 ng/mL (ref 0.00–0.08)

## 2016-12-01 LAB — COMPREHENSIVE METABOLIC PANEL
ALBUMIN: 2.5 g/dL — AB (ref 3.5–5.0)
ALK PHOS: 89 U/L (ref 38–126)
ALT: 7 U/L — ABNORMAL LOW (ref 17–63)
AST: 10 U/L — AB (ref 15–41)
Anion gap: 11 (ref 5–15)
BILIRUBIN TOTAL: 0.3 mg/dL (ref 0.3–1.2)
BUN: 36 mg/dL — AB (ref 6–20)
CALCIUM: 8.4 mg/dL — AB (ref 8.9–10.3)
CO2: 23 mmol/L (ref 22–32)
Chloride: 102 mmol/L (ref 101–111)
Creatinine, Ser: 2.52 mg/dL — ABNORMAL HIGH (ref 0.61–1.24)
GFR calc Af Amer: 29 mL/min — ABNORMAL LOW (ref >60)
GFR, EST NON AFRICAN AMERICAN: 25 mL/min — AB (ref >60)
GLUCOSE: 144 mg/dL — AB (ref 65–99)
Potassium: 4.1 mmol/L (ref 3.5–5.1)
Sodium: 136 mmol/L (ref 135–145)
TOTAL PROTEIN: 6.8 g/dL (ref 6.5–8.1)

## 2016-12-01 LAB — CBC WITH DIFFERENTIAL/PLATELET
BASOS PCT: 0 %
Basophils Absolute: 0 10*3/uL (ref 0.0–0.1)
EOS PCT: 1 %
Eosinophils Absolute: 0.1 10*3/uL (ref 0.0–0.7)
HEMATOCRIT: 23.6 % — AB (ref 39.0–52.0)
Hemoglobin: 7.1 g/dL — ABNORMAL LOW (ref 13.0–17.0)
LYMPHS ABS: 1.2 10*3/uL (ref 0.7–4.0)
Lymphocytes Relative: 15 %
MCH: 21.7 pg — AB (ref 26.0–34.0)
MCHC: 30.1 g/dL (ref 30.0–36.0)
MCV: 72.2 fL — AB (ref 78.0–100.0)
MONO ABS: 0.6 10*3/uL (ref 0.1–1.0)
Monocytes Relative: 8 %
NEUTROS ABS: 6.1 10*3/uL (ref 1.7–7.7)
Neutrophils Relative %: 76 %
Platelets: 291 10*3/uL (ref 150–400)
RBC: 3.27 MIL/uL — AB (ref 4.22–5.81)
RDW: 20.2 % — AB (ref 11.5–15.5)
WBC: 8 10*3/uL (ref 4.0–10.5)

## 2016-12-01 LAB — PROTIME-INR
INR: 1.72
Prothrombin Time: 20 seconds — ABNORMAL HIGH (ref 11.4–15.2)

## 2016-12-01 LAB — I-STAT CG4 LACTIC ACID, ED: LACTIC ACID, VENOUS: 1.08 mmol/L (ref 0.5–1.9)

## 2016-12-01 LAB — SEDIMENTATION RATE: Sed Rate: 130 mm/hr — ABNORMAL HIGH (ref 0–16)

## 2016-12-01 LAB — CBG MONITORING, ED: Glucose-Capillary: 152 mg/dL — ABNORMAL HIGH (ref 65–99)

## 2016-12-01 LAB — C-REACTIVE PROTEIN: CRP: 7.3 mg/dL — ABNORMAL HIGH (ref <1.0)

## 2016-12-01 MED ORDER — POLYETHYLENE GLYCOL 3350 17 G PO PACK
17.0000 g | PACK | Freq: Every day | ORAL | Status: DC | PRN
  Administered 2016-12-04: 17 g via ORAL
  Filled 2016-12-01: qty 1

## 2016-12-01 MED ORDER — INSULIN ASPART 100 UNIT/ML ~~LOC~~ SOLN: 0.0000 [IU] | Freq: Every day | SUBCUTANEOUS | Status: DC

## 2016-12-01 MED ORDER — VANCOMYCIN HCL 10 G IV SOLR
1250.0000 mg | INTRAVENOUS | Status: DC
  Filled 2016-12-01: qty 1250

## 2016-12-01 MED ORDER — SODIUM CHLORIDE 0.9 % IV SOLN
2000.0000 mg | Freq: Once | INTRAVENOUS | Status: AC
  Administered 2016-12-01: 2000 mg via INTRAVENOUS
  Filled 2016-12-01: qty 2000

## 2016-12-01 MED ORDER — CARVEDILOL 25 MG PO TABS
25.0000 mg | ORAL_TABLET | Freq: Two times a day (BID) | ORAL | Status: DC
  Administered 2016-12-02 – 2016-12-04 (×5): 25 mg via ORAL
  Filled 2016-12-01 (×2): qty 1
  Filled 2016-12-01: qty 2
  Filled 2016-12-01 (×2): qty 1

## 2016-12-01 MED ORDER — HEPARIN (PORCINE) IN NACL 100-0.45 UNIT/ML-% IJ SOLN
2000.0000 [IU]/h | INTRAMUSCULAR | Status: DC
  Administered 2016-12-01: 1500 [IU]/h via INTRAVENOUS
  Administered 2016-12-02 – 2016-12-03 (×2): 2000 [IU]/h via INTRAVENOUS
  Filled 2016-12-01 (×3): qty 250

## 2016-12-01 MED ORDER — DOXAZOSIN MESYLATE 8 MG PO TABS: 8.0000 mg | ORAL_TABLET | Freq: Every evening | ORAL | Status: AC

## 2016-12-01 MED ORDER — NITROGLYCERIN 0.4 MG SL SUBL: 0.4000 mg | SUBLINGUAL_TABLET | SUBLINGUAL | Status: DC | PRN

## 2016-12-01 MED ORDER — PANTOPRAZOLE SODIUM 40 MG PO TBEC
40.0000 mg | DELAYED_RELEASE_TABLET | Freq: Every day | ORAL | Status: DC
  Administered 2016-12-01 – 2016-12-03 (×3): 40 mg via ORAL
  Filled 2016-12-01 (×3): qty 1

## 2016-12-01 MED ORDER — ATORVASTATIN CALCIUM 40 MG PO TABS
40.0000 mg | ORAL_TABLET | Freq: Every day | ORAL | Status: DC
  Administered 2016-12-02 – 2016-12-03 (×2): 40 mg via ORAL
  Filled 2016-12-01 (×3): qty 1

## 2016-12-01 MED ORDER — INSULIN ASPART 100 UNIT/ML ~~LOC~~ SOLN
0.0000 [IU] | Freq: Three times a day (TID) | SUBCUTANEOUS | Status: DC
  Administered 2016-12-02: 3 [IU] via SUBCUTANEOUS
  Administered 2016-12-02 (×2): 2 [IU] via SUBCUTANEOUS
  Administered 2016-12-03: 5 [IU] via SUBCUTANEOUS
  Administered 2016-12-03: 3 [IU] via SUBCUTANEOUS
  Filled 2016-12-01 (×2): qty 1

## 2016-12-01 MED ORDER — TRAMADOL HCL 50 MG PO TABS
50.0000 mg | ORAL_TABLET | Freq: Four times a day (QID) | ORAL | Status: DC | PRN
  Administered 2016-12-02 – 2016-12-03 (×6): 50 mg via ORAL
  Filled 2016-12-01 (×6): qty 1

## 2016-12-01 MED ORDER — LEVOTHYROXINE SODIUM 50 MCG PO TABS
50.0000 ug | ORAL_TABLET | Freq: Every day | ORAL | Status: DC
  Administered 2016-12-02 – 2016-12-04 (×3): 50 ug via ORAL
  Filled 2016-12-01 (×3): qty 1

## 2016-12-01 MED ORDER — ALLOPURINOL 100 MG PO TABS
100.0000 mg | ORAL_TABLET | Freq: Every day | ORAL | Status: DC
Start: 2016-12-02 — End: 2016-12-02

## 2016-12-01 MED ORDER — TORSEMIDE 20 MG PO TABS
60.0000 mg | ORAL_TABLET | Freq: Every day | ORAL | Status: DC
  Administered 2016-12-01 – 2016-12-03 (×3): 60 mg via ORAL
  Filled 2016-12-01 (×3): qty 3

## 2016-12-01 MED ORDER — IPRATROPIUM-ALBUTEROL 0.5-2.5 (3) MG/3ML IN SOLN: 3.0000 mL | Freq: Four times a day (QID) | RESPIRATORY_TRACT | Status: DC | PRN

## 2016-12-01 MED ORDER — DEXTROSE 5 % IV SOLN
2.0000 g | INTRAVENOUS | Status: DC
  Administered 2016-12-01: 2 g via INTRAVENOUS
  Filled 2016-12-01: qty 2

## 2016-12-01 MED ORDER — METRONIDAZOLE IN NACL 5-0.79 MG/ML-% IV SOLN
500.0000 mg | Freq: Three times a day (TID) | INTRAVENOUS | Status: DC
  Administered 2016-12-01 – 2016-12-02 (×2): 500 mg via INTRAVENOUS
  Filled 2016-12-01 (×2): qty 100

## 2016-12-01 MED ORDER — CLONIDINE HCL 0.2 MG PO TABS
0.2000 mg | ORAL_TABLET | Freq: Two times a day (BID) | ORAL | Status: DC
  Administered 2016-12-01 – 2016-12-03 (×5): 0.2 mg via ORAL
  Filled 2016-12-01 (×5): qty 1

## 2016-12-01 NOTE — Progress Notes (Signed)
Patient ID: Leonard [name], male   DOB: 1950/06/13, 66 y.o.   MRN: [id number] Reviewed: Agree with Dr. Graylin Shiver documentation and management.  Appreciate Dr. Gwendlyn Deutscher involvement.

## 2016-12-01 NOTE — Assessment & Plan Note (Signed)
Hypertension longstanding with dizziness currently in good control in office today.  Patient reports adherence with medication by using pill box. Control is likely improved due to use of pill box and frequent checks on utilization.

## 2016-12-01 NOTE — Assessment & Plan Note (Signed)
Left lower extremity pain, erythema and swelling vs right worsening over the past two weeks during period of therapeutic or near therapeutic INR in patient with clot history.   Worsening was concering and Dr. Gwendlyn Deutscher and PCP - Juanito Doom visualized left foot with worsened symptomatology.  Minimal edema in right lower extremity however the small lesion on right medial surface of his great toe is was concerning.  Encouraged good hygiene and use of ointment with band-aid to keep moist to allow healing.

## 2016-12-01 NOTE — ED Notes (Signed)
Ordered dinner tray for pt.  

## 2016-12-01 NOTE — Progress Notes (Signed)
ANTICOAGULATION CONSULT NOTE - Initial Consult  Pharmacy Consult for heparin Indication: history of DVT- anticoag on warfarin PTA  No Known Allergies  Patient Measurements: Height: 5\' 8"  (172.7 cm) Weight: 197 lb (89.4 kg) IBW/kg (Calculated) : 68.4 Heparin Dosing Weight: 86.7  Vital Signs: Temp: 98.2 F (36.8 C) (10/29 2049) Temp Source: Oral (10/29 2049) BP: 154/56 (10/29 2049) Pulse Rate: 70 (10/29 2049)  Labs:  Recent Labs  12/01/16 1400  HGB 7.1*  HCT 23.6*  PLT 291  LABPROT 20.0*  INR 1.72  CREATININE 2.52*    Estimated Creatinine Clearance: 31.7 mL/min (A) (by C-G formula based on SCr of 2.52 mg/dL (H)).   Medical History: Past Medical History:  Diagnosis Date  . Cancer ([place])    skin cancer-  . CHF (congestive heart failure) ([place])   . Chronic kidney disease    not on dialysis  . Coronary artery disease   . Gangrene of toe ([place])    right 5th/notes 01/04/2013   . High cholesterol   . Hypertension   . Iron deficiency anemia 08/03/2015  . 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 PAD (peripheral artery disease) ([place])   . Shortness of breath dyspnea    with exertion  . Type II diabetes mellitus (HCC)    Type II    Medications:  Scheduled:  . allopurinol  100 mg Oral Daily  . [START ON 12/02/2016] atorvastatin  40 mg Oral q1800  . [START ON 12/02/2016] carvedilol  25 mg Oral BID WC  . cloNIDine  0.2 mg Oral BID  . [START ON 12/02/2016] insulin aspart  0-15 Units Subcutaneous TID WC  . insulin aspart  0-5 Units Subcutaneous QHS  . [START ON 12/02/2016] levothyroxine  50 mcg Oral QAC breakfast  . pantoprazole  40 mg Oral Daily  . torsemide  60 mg Oral Daily    Assessment: 83 YOM admitted with swelling of LLE and likely LLE osteomyelitis. Pharmacy consulted for heparin dosing for history of DVT- on warfarin PTA. Last dose PTA 10/28. Hgb 7.1, Hct 23.6, Plt 291, Scr 2.52 (at baseline). DVT doppler negative. INR 1.72. Warfarin  being held for ortho consult.   Goal of Therapy:   Heparin level 0.3-0.7 units/ml Monitor platelets by anticoagulation protocol: Yes   Plan:  Start heparin infusion at 1500 units/hr  6 hour heparin level, daily heparin level and CBC Monitor s/x of bleeding.   Jerrye Noble, PharmD Candidate 12/01/2016,9:39 PM

## 2016-12-01 NOTE — H&P (Signed)
[facility] Admission History and Physical Service Pager: [phone]  [name] Medical record number: [id number] Date of birth: 1951-01-20 Age: 66 y.o. Gender: male  Primary Care Provider: [name], MD Consultants: orthopedics  Code Status: full   Chief Complaint: LLE swelling   Assessment and Plan: Leonard [name] is a 66 y.o. male presenting with left ankle and foot swelling x 1 mo. PMH is significant for hyperlipidemia, HTN, CAD s/p CABG, tobacco abuse, CKD stage 4, uncontrolled T2DM, and COPD.   LLE swelling/left foot osteomyelitis Patient complains of left ankle and foot swelling and pain x 30monthin duration 2/2 osteomyelitis. Xray in ED showing lytic destruction of proximal base on first metatarsal and superior/posterior portion of calcaneus concerning for osteomyelits. Patient was seen in clinic and sent to ED for possible DVT workup. ESR and CRP elevated to 130 and 7.3 respectively.  Lower extremity venous doppler on admission showing no evidence of DVTs, making this unlikely cause for unilateral lower extremity swelling. Possible cellulitis, but less likely given no erythema, fever, or leukocytosis. Orthopedics was consulted in ED and recommend evaluation in am by Dr. DSharol Givenand heparin gtts to allow INR to drift down for possible surgery. Do not recommend MRI at this time, and will allow diet till midnight. CBC on admission WNL, lactic acid 1.08, CRP elevated to 7.3. Vancomycin and rocephin were given in ED after blood cultures were obtained.  -Admit to FPTS, attending Dr. WMingo Amber-Orthopedics consulted, appreciate recommendations  -Daily CBCs -Will likely need cardiac clearance for surgery given extensive cardiac history  -vitals qshift  -monitor blood cultures -will begin flagyl for anaerobic coverage x 1day since he has already gotten ceftriaxone. Cannot switch to Zosyn due to concern for antagonism b/n to beta lactams  when given together. So we will change his ceftriaxone and Flagyl to Zosyn which could also give uKoreapseudomonal coverage. 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 On warfarin at home. Lower extremity DVT Doppler negative today. PT/INR subtherapeutic.  -heprin gtt in preparation for possible orthopedic surgery  -hold home warfarin  T2DM: Poorly controlled. Last A1c 11.0 on 9/70/2018. On novolog 15 units in pm, lantus 50 units in am. Last dose of Lantus this morning. -monitor CBG -monitor A1C -sliding scale insulin -Start Lantus tomorrow morning  Right toe wound: Fresh-looking wound with bloody scab on his right toe. No signs of infection.  -Wound care consult  HTN: Normotensive in ED. amlodipine 155m carvedilol 5071mclonidine 0.4mg75moxazosin 8 mg, Imdur & hydralazine 100 mg at home. -carvedilol 25mg8mce a day. On 50 mg twice a day at home -Continue home clonidine -Hold amlodipine, doxazosin, hydralazine and Imdur. Will introduce as BP allows  HLD Home medications: atorvastatin 40mg 69mntinue home medications   CHF: Home medications: torsemide 60mg. 18m on 06/05/2015 showing LV EF of 60-65% with normal systolic function. Patient denies symptoms of dyspnea or orthopnea. Edema on exam was unilateral. Patient appeared euvolemic on admission.   -monitor Is&Os -Daily weight  CAD s/p CABG in 2015/PAD: No anginal symptoms. Barely palpable pulses in his lower legs and foot.  Home medications: ASA 81mg, i32m 60mg, ni51mlycerin prn,   -May reintroduce Imdur if BP allows -Continue home nitroglycerin -Will obtain EKG -Cardiology consult for cardiac clearance if going to OR  CKD stage 4: Serum creatinine better than  baseline at 2.52. Per chart review patient was to get AVF and is assigned to Dr. Marval Regal, Nephrology. However, does not appear to  see nephrology. Patient is still making urine.  -continue to monitor renal function with daily BMP -consider consultation with nephrology while inpatient -will need to inquire further into outpatient nephrology workup   COPD: not on any home medications. No complaints of dyspnea or cough. Lungs CTAB. -prn duonebs  -continue to monitor   Hypothyroidism Home medications: synthroid 50 mcg -continue home medications   Tobacco abuse  Patient reports former smoking history. Patient quit smoking 3 years ago when he had an MI.   FEN/GI:  Heart healthy/carb modified diet,  NPO@ midnight   Prophylaxis: heparin gtt   Disposition: admit to med-surg, attending Dr. Mingo Amber.   History of Present Illness:   Leonard [name] is a 66 y.o. male presenting with left ankle and foot swelling x 37mo Patient states that he has noticed increased swelling and pain in LLE, but has been able to bear weight. Patient denies any injury or skin breakdown in area. Patient denies erythema but endorses hyperpigmentation. Patient has not tried anything to help with swelling/pain. States he elevates leg on occasion.   [facility]. At this appointment patient was seen by both Dr. EGwendlyn Deutscherand Dr. KValentina Luckswho recommended patient come to ED for possible DVT given increased unilateral swelling and pain. Patient was seen in clinic on 10/12 by PCP for swelling/pain at which time etiology was believed to be gout vs. Hyperkeratotic lesion vs arthritic pain and was given Tramadol and recommended for follow up x 177mo  Patient is a poorly controlled Type 2 diabetic, who states he takes his medications daily. BG usually in 300s but 2 weeks ago changes his medications to lantus 50units and now CBGs running in 150s. Patient denies fever, chills, CP, SOB, nausea, vomiting, or diarrhea. Reports some dizziness in clinic and states low blood pressure at appointment today.    Patient quit smoking about 3 years ago. Denies alcohol or recreational drug use.  In ED x-ray and venous doppler studies ordered for possible DVT. Doppler was negative, however, Xray revealed 1st metatarsal osteomyelitis and calcaneal osteomyelitis. Orthopedics was consulted and recommended beginning antibiotics and to consider possible MRI. ED physician began vancomycin and rocephin and blood cultures were ordered. WBC were wnl and lactic acid wnl.    Review Of Systems: Per HPI with the following additions:  Review of Systems  Constitutional: Negative for chills and fever.  Respiratory: Negative for shortness of breath.   Cardiovascular: Positive for leg swelling. Negative for chest pain.  Gastrointestinal: Positive for abdominal pain. Negative for diarrhea, nausea and vomiting.  Genitourinary: Negative for dysuria, frequency and urgency.  Musculoskeletal: Negative for myalgias.  Neurological: Positive for dizziness.    Patient Active Problem List   Diagnosis Date Noted  . Osteomyelitis (HCMegargel10/29/2018  . Left foot pain 10/29/2016  . Long term (current) use of anticoagulants [Z79.01] 09/09/2016  . Chronic deep vein thrombosis (DVT) of femoral vein of left lower extremity (HCRoscoe08/02/2016  . Morbid obesity due to excess calories (HCMetolius02/01/2017  . 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rozier01/25/2018  . Fatigue associated with anemia 01/21/2016  . Diabetic retinopathy (HCAngelica10/11/2015  . CHF (congestive heart failure) (HCBristow07/24/2017  . Iron deficiency anemia 08/03/2015  . Claudication (HCFulton  . Type 2  diabetes mellitus with stage 3 chronic kidney disease, with long-term current use of insulin ([place])   . Hematuria 06/27/2015  . Skin lesion of face 02/22/2015  . 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 Deficiency anemia 01/15/2015  . CKD (chronic kidney disease) stage 4, GFR 15-29 ml/min (HCC) 01/15/2015  .  Hypothyroidism 11/22/2014  . COPD GOLD III  02/16/2014  . History of tobacco use 08/23/2013  . 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 CAD (coronary artery disease) 04/06/2013  . PVD - hx of Rt SFA PTA and s/p Rt 4-5th toe amp Dec 2014 04/05/2013  . Essential hypertension 11/25/2012  . Mixed hyperlipidemia 07/17/2008    Past Medical History: Past Medical History:  Diagnosis Date  . Cancer ([place])    skin cancer-  . CHF (congestive heart failure) ([place])   . Chronic kidney disease    not on dialysis  . Coronary artery disease   . Gangrene of toe ([place])    right 5th/notes 01/04/2013   . High cholesterol   . Hypertension   . Iron deficiency anemia 08/03/2015  . 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 PAD (peripheral artery disease) ([place])   . Shortness of breath dyspnea    with exertion  . Type II diabetes mellitus ([place])    Type II    Past Surgical History: Past Surgical History:  Procedure Laterality Date  . AMPUTATION Right 01/28/2013   Procedure: RAY AMPUTATION RIGHT  4th & 5th TOE;  Surgeon: Rosetta Posner, MD;  Location[facility] OR;  Service: Vascular;  Laterality: Right;  . ANGIOPLASTY / STENTING FEMORAL Right 01/13/2013   superficial femoral artery x 2 (3 mm x 60 mm, 4 mm x 60 mm)  . AV FISTULA PLACEMENT Left 02/11/2016   Procedure: LEFT UPPER ARM BRACHIAL CEPHALIC ARTERIOVENOUS (AV) FISTULA CREATION;  Surgeon: Conrad [place], MD;  Location: [place];  Service: Vascular;  Laterality: Left;  . COLONOSCOPY W/ POLYPECTOMY    . CORONARY ARTERY BYPASS GRAFT N/A 04/07/2013   Procedure: CORONARY ARTERY BYPASS GRAFTING (CABG);  Surgeon: Ivin Poot, MD;  Location: [place];  Service: Open Heart Surgery;  Laterality: N/A;  . INTRAOPERATIVE TRANSESOPHAGEAL ECHOCARDIOGRAM N/A 04/07/2013   Procedure: INTRAOPERATIVE TRANSESOPHAGEAL ECHOCARDIOGRAM;  Surgeon: Ivin Poot, MD;  Location: [place];  Service: Open Heart Surgery;  Laterality: N/A;  . LEFT HEART CATHETERIZATION WITH CORONARY  ANGIOGRAM N/A 04/05/2013   Procedure: LEFT HEART CATHETERIZATION WITH CORONARY ANGIOGRAM;  Surgeon: Peter M Martinique, MD;  Location: [facility] CATH LAB;  Service: Cardiovascular;  Laterality: N/A;  . LOWER EXTREMITY ANGIOGRAM Bilateral 01/06/2013   Procedure: LOWER EXTREMITY ANGIOGRAM;  Surgeon: Conrad [place], MD;  Location: [facility] CATH LAB;  Service: Cardiovascular;  Laterality: Bilateral;  . LOWER EXTREMITY ANGIOGRAM Right 01/13/2013   Procedure: LOWER EXTREMITY ANGIOGRAM;  Surgeon: Conrad [place], MD;  Location: [facility] CATH LAB;  Service: Cardiovascular;  Laterality: Right;  . SKIN CANCER EXCISION  2017  . THROAT SURGERY  1983   polyps  . TONSILLECTOMY AND ADENOIDECTOMY  ~ 1965    Social History: Social History  Substance Use Topics  . Smoking status: Former Smoker    Packs/day: 1.00    Years: 46.00    Types: Cigarettes    Start date: 02/04/1967    Quit date: 02/03/2013  . Smokeless tobacco: Never Used     Comment: Doing well with quitting.  . Alcohol use No   Additional social history: quit smoking 3 years  ago, denies alcohol use, denies illicit drug use   Please also refer to relevant sections of EMR.  Family History: Family History  Problem Relation Age of Onset  . Cancer Mother        lung cancer  . Hypertension Mother   . Cancer Sister        patient thinks it was uterine cancer  . Hypertension Sister   . Heart attack Sister   . Stroke Neg Hx    Allergies and Medications: No Known Allergies No current facility-administered medications on file prior to encounter.    Current Outpatient Prescriptions on File Prior to Encounter  Medication Sig Dispense Refill  . allopurinol (ZYLOPRIM) 100 MG tablet Take 1 tablet (100 mg total) by mouth daily. 30 tablet 6  . amLODipine (NORVASC) 10 MG tablet Take 1 tablet (10 mg total) by mouth daily. 30 tablet 6  . aspirin 81 MG chewable tablet Chew 81 mg by mouth daily.    Marland Kitchen atorvastatin (LIPITOR) 40 MG tablet TAKE ONE (1) TABLET BY MOUTH EVERY DAY AT  6:00PM 90 tablet 3  . Blood Glucose Monitoring Suppl (ONETOUCH VERIO FLEX SYSTEM) w/Device KIT 1 kit by Does not apply route daily. 1 kit 3  . carvedilol (COREG) 25 MG tablet Take 2 tablets (50 mg total) by mouth 2 (two) times daily with a meal. 120 tablet 3  . cloNIDine (CATAPRES) 0.2 MG tablet TAKE TWO (2) TABLETS BY MOUTH 2 TIMES DAILY 120 tablet 3  . doxazosin (CARDURA) 8 MG tablet Take 1 tablet (8 mg total) by mouth every evening.    . ferrous sulfate 325 (65 FE) MG tablet TAKE ONE (1) TABLET BY MOUTH TWO (2) TIMES DAILY WITH A MEAL 60 tablet 3  . glucose blood (ONETOUCH VERIO) test strip Use as instructed to test three times daily. ICD-10 code: E11.22 100 each 12  . hydrALAZINE (APRESOLINE) 100 MG tablet TAKE ONE TABLET BY MOUTH EVERY EIGHT HOURS 90 tablet 1  . insulin aspart (NOVOLOG FLEXPEN) 100 UNIT/ML FlexPen Inject 15 Units into the skin every evening.     . Insulin Glargine (LANTUS SOLOSTAR) 100 UNIT/ML Solostar Pen Inject 50 Units into the skin every morning. 15 mL 3  . isosorbide mononitrate (IMDUR) 60 MG 24 hr tablet Take 60 mg by mouth daily.    Marland Kitchen levothyroxine (SYNTHROID, LEVOTHROID) 50 MCG tablet Take 50 mcg by mouth daily before breakfast.    . nitroGLYCERIN (NITROSTAT) 0.4 MG SL tablet Place 1 tablet (0.4 mg total) under the tongue every 5 (five) minutes as needed for chest pain. 30 tablet 12  . ONETOUCH DELICA LANCETS 94I MISC 1 each by Does not apply route 3 (three) times daily. ICD-10 code: E11.22 100 each 12  . oxyCODONE-acetaminophen (PERCOCET) 5-325 MG tablet Take 2 tablets by mouth every 4 (four) hours as needed. 20 tablet 0  . pantoprazole (PROTONIX) 40 MG tablet TAKE ONE (1) TABLET BY MOUTH EVERY DAY 90 tablet 0  . potassium chloride SA (K-DUR,KLOR-CON) 20 MEQ tablet Take 20 mEq by mouth daily.    Marland Kitchen torsemide (DEMADEX) 20 MG tablet Take 3 tablets (60 mg total) by mouth daily. May take an extra 20 mg Daily for weight gain above 212 lbs 90 tablet 6  . traMADol (ULTRAM) 50  MG tablet Take 1 tablet (50 mg total) by mouth every 8 (eight) hours as needed. 30 tablet 0  . warfarin (COUMADIN) 5 MG tablet Take 2.5-5 mg by mouth See admin instructions. Take 2.5 mg (  1/2 tablet) daily except take 5 mg (1 tablet) on Monday and Friday      Objective: BP (!) 121/52 (BP Location: Right Arm)   Pulse 68   Temp 98.4 F (36.9 C) (Oral)   Resp 18   Ht 5' 8" (1.727 m)   Wt 197 lb (89.4 kg)   SpO2 100%   BMI 29.95 kg/m  Exam: General: awake and alert, laying flat in bed, NAD Eyes: PERRL, EOMI ENTM: moist mucous membranes  Neck: supple, no LAD Cardiovascular: RRR, no MRG, non palpable pulses in left lower extremity, thready pulses in right lower extremity  Respiratory: CTAB, no wheezes, rales, or rhonchi  Gastrointestinal: soft, non tender, non distended, bowel sounds normal MSK: 1+ pitting edema in LLE up to ankle, tenderness to palpation in left calf Neuro: sensation intact in face and upper extremities bilaterally, no sensation in toes of left foot Psych: normal affect  Derm:         Labs and Imaging: CBC BMET   Recent Labs Lab 12/01/16 1400  WBC 8.0  HGB 7.1*  HCT 23.6*  PLT 291    Recent Labs Lab 12/01/16 1400  NA 136  K 4.1  CL 102  CO2 23  BUN 36*  CREATININE 2.52*  GLUCOSE 144*  CALCIUM 8.4*       Ref. Range 12/01/2016 14:44  CRP Latest Ref Range: <1.0 mg/dL 7.3 Juanda Crumble, DO 12/01/2016, 5:00 PM PGY-1, [facility] pager: [phone], text pages welcome   I have seen and evaluated the patient with Dr. Tammi Klippel. I am in agreement with the note above in its revised form. My additions are in red.  Wendee Beavers, MD, PGY-3 12/01/2016 6:24 PM

## 2016-12-01 NOTE — ED Notes (Signed)
Admitting at bedside 

## 2016-12-01 NOTE — Progress Notes (Signed)
LLE venous duplex  has been completed. Preliminary results can be found: Chart review -> CV Proc  Larua Collier Eunice, RDMS, RVT   

## 2016-12-01 NOTE — ED Notes (Signed)
EKG given to MD Regenia Skeeter

## 2016-12-01 NOTE — Progress Notes (Addendum)
I saw this patient with Dr. Valentina Lucks and his resident today. I worry about left foot swelling and tendernes given that he has had blood clot in the past. He was advised to go to the ED for an ultrasound to assess for blood clot in his left foot. If this is negative, he is encouraged to follow-up tomorrow with vascular doctor as scheduled.  I discussed patient with the ED charge nurse Lenna Sciara). Patient will be transferred to the ED.  In addition, he has a small scab like ulcer/stage I ulcer on his right foot ( medial aspect of his big foot). Less concerning for osteomyelitis.  Will need close monitoring. F/U with vascular tomorrow, need PCP f/u for wound check.

## 2016-12-01 NOTE — Consult Note (Signed)
Reason for Consult:Left foot osteo Referring Physician: Orbin Mayeux is an 66 y.o. male with medical problems including DM, PAD, and CAD HPI: Rainn has a history of left ankle/foot pain for about 2 weeks. It's been waxing and waning during that time. He denies any current ulcerations on the foot. His sugars have been running high at home (300's-400's) but he's been trying hard lately to keep them down. He denies fevers, chills, sweats.  Past Medical History:  Diagnosis Date  . Cancer ([place])    skin cancer-  . CHF (congestive heart failure) ([place])   . Chronic kidney disease    not on dialysis  . Coronary artery disease   . Gangrene of toe ([place])    right 5th/notes 01/04/2013   . High cholesterol   . Hypertension   . Iron deficiency anemia 08/03/2015  . 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 PAD (peripheral artery disease) ([place])   . Shortness of breath dyspnea    with exertion  . Type II diabetes mellitus ([place])    Type II    Past Surgical History:  Procedure Laterality Date  . AMPUTATION Right 01/28/2013   Procedure: RAY AMPUTATION RIGHT  4th & 5th TOE;  Surgeon: Rosetta Posner, MD;  Location: [facility] OR;  Service: Vascular;  Laterality: Right;  . ANGIOPLASTY / STENTING FEMORAL Right 01/13/2013   superficial femoral artery x 2 (3 mm x 60 mm, 4 mm x 60 mm)  . AV FISTULA PLACEMENT Left 02/11/2016   Procedure: LEFT UPPER ARM BRACHIAL CEPHALIC ARTERIOVENOUS (AV) FISTULA CREATION;  Surgeon: Conrad [place], MD;  Location: [place];  Service: Vascular;  Laterality: Left;  . COLONOSCOPY W/ POLYPECTOMY    . CORONARY ARTERY BYPASS GRAFT N/A 04/07/2013   Procedure: CORONARY ARTERY BYPASS GRAFTING (CABG);  Surgeon: Ivin Poot, MD;  Location: [place];  Service: Open Heart Surgery;  Laterality: N/A;  . INTRAOPERATIVE TRANSESOPHAGEAL ECHOCARDIOGRAM N/A 04/07/2013   Procedure: INTRAOPERATIVE TRANSESOPHAGEAL ECHOCARDIOGRAM;  Surgeon: Ivin Poot, MD;  Location: [place];  Service: Open Heart Surgery;  Laterality: N/A;  . LEFT HEART CATHETERIZATION WITH CORONARY ANGIOGRAM N/A 04/05/2013   Procedure: LEFT HEART CATHETERIZATION WITH CORONARY ANGIOGRAM;  Surgeon: Peter M Martinique, MD;  Location: [facility] CATH LAB;  Service: Cardiovascular;  Laterality: N/A;  . LOWER EXTREMITY ANGIOGRAM Bilateral 01/06/2013   Procedure: LOWER EXTREMITY ANGIOGRAM;  Surgeon: Conrad Viola, MD;  Location: [facility] CATH LAB;  Service: Cardiovascular;  Laterality: Bilateral;  . LOWER EXTREMITY ANGIOGRAM Right 01/13/2013   Procedure: LOWER EXTREMITY ANGIOGRAM;  Surgeon: Conrad Ke[place], MD;  Location: [facility] CATH LAB;  Service: Cardiovascular;  Laterality: Right;  . SKIN CANCER EXCISION  2017  . THROAT SURGERY  1983   polyps  . TONSILLECTOMY AND ADENOIDECTOMY  ~ 1965    Family History  Problem Relation Age of Onset  . Cancer Mother        lung cancer  . Hypertension Mother   . Cancer Sister        patient thinks it was uterine cancer  . Hypertension Sister   . Heart attack Sister   . Stroke Neg Hx     Social History:  reports that he quit smoking about 3 years ago. His smoking use included Cigarettes. He started smoking about 49 years ago. He has a 46.00 pack-year smoking history. He has never used smokeless tobacco. He reports that he does not drink alcohol or use drugs.  Allergies: No  Known Allergies  Medications: I have reviewed the patient's current medications.  Results for orders placed or performed during the hospital encounter of 12/01/16 (from the past 48 hour(s))  Comprehensive metabolic panel     Status: Abnormal   Collection Time: 12/01/16  2:00 PM  Result Value Ref Range   Sodium 136 135 - 145 mmol/L   Potassium 4.1 3.5 - 5.1 mmol/L   Chloride 102 101 - 111 mmol/L   CO2 23 22 - 32 mmol/L   Glucose, Bld 144 (H) 65 - 99 mg/dL   BUN 36 (H) 6 - 20 mg/dL   Creatinine, Ser 2.52 (H) 0.61 - 1.24 mg/dL   Calcium 8.4 (L) 8.9 - 10.3 mg/dL   Total Protein 6.8 6.5 - 8.1 g/dL   Albumin  2.5 (L) 3.5 - 5.0 g/dL   AST 10 (L) 15 - 41 U/L   ALT 7 (L) 17 - 63 U/L   Alkaline Phosphatase 89 38 - 126 U/L   Total Bilirubin 0.3 0.3 - 1.2 mg/dL   GFR calc non Af Amer 25 (L) >60 mL/min   GFR calc Af Amer 29 (L) >60 mL/min    Comment: (NOTE) The eGFR has been calculated using the CKD EPI equation. This calculation has not been validated in all clinical situations. eGFR's persistently <60 mL/min signify possible Chronic Kidney Disease.    Anion gap 11 5 - 15  Protime-INR     Status: Abnormal   Collection Time: 12/01/16  2:00 PM  Result Value Ref Range   Prothrombin Time 20.0 (H) 11.4 - 15.2 seconds   INR 1.72   CBC with Differential     Status: Abnormal   Collection Time: 12/01/16  2:00 PM  Result Value Ref Range   WBC 8.0 4.0 - 10.5 K/uL   RBC 3.27 (L) 4.22 - 5.81 MIL/uL   Hemoglobin 7.1 (L) 13.0 - 17.0 g/dL   HCT 23.6 (L) 39.0 - 52.0 %   MCV 72.2 (L) 78.0 - 100.0 fL   MCH 21.7 (L) 26.0 - 34.0 pg   MCHC 30.1 30.0 - 36.0 g/dL   RDW 20.2 (H) 11.5 - 15.5 %   Platelets 291 150 - 400 K/uL   Neutrophils Relative % 76 %   Lymphocytes Relative 15 %   Monocytes Relative 8 %   Eosinophils Relative 1 %   Basophils Relative 0 %   Neutro Abs 6.1 1.7 - 7.7 K/uL   Lymphs Abs 1.2 0.7 - 4.0 K/uL   Monocytes Absolute 0.6 0.1 - 1.0 K/uL   Eosinophils Absolute 0.1 0.0 - 0.7 K/uL   Basophils Absolute 0.0 0.0 - 0.1 K/uL   RBC Morphology ELLIPTOCYTES     Comment: POLYCHROMASIA PRESENT SPHEROCYTES   I-stat troponin, ED     Status: None   Collection Time: 12/01/16  2:34 PM  Result Value Ref Range   Troponin i, poc 0.00 0.00 - 0.08 ng/mL   Comment 3            Comment: Due to the release kinetics of cTnI, a negative result within the first hours of the onset of symptoms does not rule out myocardial infarction with certainty. If myocardial infarction is still suspected, repeat the test at appropriate intervals.   I-Stat CG4 Lactic Acid, ED     Status: None   Collection Time:  12/01/16  2:36 PM  Result Value Ref Range   Lactic Acid, Venous 1.08 0.5 - 1.9 mmol/L   *Note: Due to a large  number of results and/or encounters for the requested time period, some results have not been displayed. A complete set of results can be found in Results Review.    Dg Ankle Complete Left  Result Date: 12/01/2016 CLINICAL DATA:  66 year old diabetic presenting with swelling and discoloration to the left lower leg with pain in the left foot and ankle. Personal history of DVT. Current history of gout. EXAM: LEFT ANKLE COMPLETE - 3+ VIEW COMPARISON:  None. FINDINGS: Diffuse soft tissue swelling/edema. No evidence of acute or subacute fracture or dislocation. Ankle mortise intact with well-preserved joint space. Modeling of the talus with widening of the subtalar joint and possible lucencies within the medial talus and the talar dome. Well-preserved bone mineral density. Arterial calcification involving the tibioperoneal arteries, the plantar arteries and the dorsalis pedis artery. IMPRESSION: 1. 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 2. No acute osseous abnormalities otherwise. Electronically Signed   By: Evangeline Dakin M.D.   On: 12/01/2016 13:08   Dg Foot Complete Left  Result Date: 12/01/2016 CLINICAL DATA:  Left leg swelling and discoloration. EXAM: LEFT FOOT - COMPLETE 3+ VIEW COMPARISON:  Radiographs of September 21, 2016. FINDINGS: Vascular calcifications are noted. Lytic destruction is seen involving the proximal base of the first metatarsal concerning for osteomyelitis. There also appears to be lytic destruction involving the superior and posterior aspect of the calcaneus. No definite fracture is noted. Joint spaces are intact. IMPRESSION: Lytic destruction is seen involving the proximal base of the first metatarsal as well as the superior and posterior portion of the calcaneus concerning for  osteomyelitis. MRI may be performed further evaluation. Electronically Signed   By: Marijo Conception, M.D.   On: 12/01/2016 13:08    Review of Systems  Constitutional: Negative for chills, fever and weight loss.  HENT: Negative for ear discharge, ear pain, hearing loss and tinnitus.   Eyes: Negative for blurred vision, double vision, photophobia and pain.  Respiratory: Negative for cough, sputum production and shortness of breath.   Cardiovascular: Negative for chest pain.  Gastrointestinal: Negative for abdominal pain, nausea and vomiting.  Genitourinary: Negative for dysuria, flank pain, frequency and urgency.  Musculoskeletal: Positive for joint pain (Right ankle/foot). Negative for back pain, falls, myalgias and neck pain.  Neurological: Negative for dizziness, tingling, sensory change, focal weakness, loss of consciousness and headaches.  Endo/Heme/Allergies: Does not bruise/bleed easily.  Psychiatric/Behavioral: Negative for depression, memory loss and substance abuse. The patient is not nervous/anxious.    Blood pressure (!) 121/52, pulse 68, temperature 98.4 F (36.9 C), temperature source Oral, resp. rate 18, height '5\' 8"'  (1.727 m), weight 89.4 kg (197 lb), SpO2 100 %. Physical Exam  Constitutional: He appears well-developed and well-nourished. No distress.  HENT:  Head: Normocephalic.  Eyes: Conjunctivae are normal. Right eye exhibits no discharge. Left eye exhibits no discharge. No scleral icterus.  Neck: Normal range of motion.  Cardiovascular: Normal rate and regular rhythm.   Respiratory: Effort normal. No respiratory distress.  Musculoskeletal:  RLE No traumatic wounds, ecchymosis, or rash  Nontender             Small wound over 1st MTP joint laterally  No knee or ankle effusion  Knee stable to varus/ valgus and anterior/posterior stress  Sens DPN, SPN, TN paresthetic  Motor EHL, ext, flex, evers 5/5  DP 0, PT 0, No significant edema, warm, prior ray amputation  lateral  LLE No traumatic wounds, ecchymosis, or  rash  TTP ankle diffusely  No knee or ankle effusion  Knee stable to varus/ valgus and anterior/posterior stress  Sens DPN, SPN, TN paresthetic, worse than right  Motor EHL, ext, flex, evers 5/5  DP 0, PT 0, 2+ NP edema, warm, good cap refill  Neurological: He is alert.  Skin: Skin is warm and dry. He is not diaphoretic.  Psychiatric: He has a normal mood and affect. His behavior is normal.    Assessment/Plan: Left ankle/foot osteomyelitis -- Dr. Sharol Given to evaluate in AM. [facility] for diet. Anticoagulated on coumadin -- Suggest heparin gtts and allow INR to drift down in case surgery indicated.    Lisette Abu, PA-C Orthopedic Surgery [phone] 12/01/2016, 3:21 PM

## 2016-12-01 NOTE — Patient Instructions (Addendum)
It was nice seeing you with Dr. Valentina Lucks and his resident today. I worry about your left foot swelling given that you have had blood clot in the past. Please go to the ED for an ultrasound to assess for blood clot in your left foot. If this is negative, ensure follow-up tomorrow with your vascular doctor.  Due to your dizziness symptoms and low blood pressure reading, Please stop AM doxazosin (Continue the PM dosing of 8mg  ONCE daily).     Follow up with PCP, Dr. Juanito Doom, in 1-2 weeks

## 2016-12-01 NOTE — Progress Notes (Signed)
Pharmacy Antibiotic Note  Leonard [name] is a 66 y.o. male admitted on 12/01/2016 with osteomyelitis.  Pharmacy has been consulted for vancomycin and ceftriaxone dosing. WBC 8.0, LA 1.08 Scr 2.52, Afebrile. Foot Xray: concerning for osteomyelitis.   Plan: Vancomycin 2g IV X1 then vancomycin 1250 mg IV every 24 hours.  Ceftriaxone 2g IV every 24 hours.  Monitor for Scr, c/s, clinical resolution. F/u de-escalation plan/LOT, vancomycin trough as indicated   Height: 5\' 8"  (172.7 cm) Weight: 197 lb (89.4 kg) IBW/kg (Calculated) : 68.4  Temp (24hrs), Avg:98.4 F (36.9 C), Min:98.4 F (36.9 C), Max:98.4 F (36.9 C)   Recent Labs Lab 12/01/16 1400 12/01/16 1436  WBC 8.0  --   CREATININE 2.52*  --   LATICACIDVEN  --  1.08    Estimated Creatinine Clearance: 31.7 mL/min (A) (by C-G formula based on SCr of 2.52 mg/dL (H)).    No Known Allergies  Antimicrobials this admission: Vancomycin 10/29 >>  Ceftriaxone 10/29 >>     Microbiology results: 10/29 BCx:   Thank you for allowing pharmacy to be a part of this patient's care.  Jerrye Noble, PharmD Candidate  12/01/2016 3:33 PM

## 2016-12-01 NOTE — Assessment & Plan Note (Signed)
Diabetes longstanding poor control due to lack of meal time dosing admustment due to variability in meal intake.  Patient denies hypoglycemic events and is able to verbalize appropriate hypoglycemia management plan. Patient reports adherence with medication however denies use of AM rapid insulin due to lack of eating a consistent breakfast.  Continued same doses of lantus and novolog given lack of later day dosing CBGs. CGM monitoring will be placed in the future once lower extremity symptomatology improve.

## 2016-12-01 NOTE — ED Notes (Signed)
Admitting team at bedside.

## 2016-12-01 NOTE — ED Provider Notes (Signed)
[facility] EMERGENCY DEPARTMENT Provider Note   CSN: [id number] Arrival date & time: 12/01/16  1057     History   Chief Complaint Chief Complaint  Patient presents with  . Leg Swelling    HPI Leonard [name] is a 66 y.o. male.  HPI  66 year old male presents with left foot and ankle swelling.  He states this is been ongoing for about 2 weeks.  His sister finally made him get it checked out and he went to his primary care doctor's office who sent him here for DVT ultrasound.  Patient states the swelling and discoloration have been stable.  He denies any pain in his calf.  He denies any chest pain or shortness of breath.  It hurts primarily when he is up walking on it but does not hurt when resting.  Denies any current pain.  He thinks he is on a blood thinner and has been told he had a DVT in the past but he does not remember this.  He denies missing any of his medicines. No numbness/weakness.  Past Medical History:  Diagnosis Date  . Cancer ([place])    skin cancer-  . CHF (congestive heart failure) ([place])   . Chronic kidney disease    not on dialysis  . Coronary artery disease   . Gangrene of toe ([place])    right 5th/notes 01/04/2013   . High cholesterol   . Hypertension   . Iron deficiency anemia 08/03/2015  . 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 PAD (peripheral artery disease) ([place])   . Shortness of breath dyspnea    with exertion  . Type II diabetes mellitus ([place])    Type II    Patient Active Problem List   Diagnosis Date Noted  . Left foot pain 10/29/2016  . Long term (current) use of anticoagulants [Z79.01] 09/09/2016  . Chronic deep vein thrombosis (DVT) of femoral vein of left lower extremity ([place]) 09/03/2016  . Morbid obesity due to excess calories ([place]) 03/17/2016  . Anemia due to stage 4 chronic kidney disease treated with darbepoetin ([place]) 02/28/2016  . Fatigue associated with anemia 01/21/2016  . Diabetic  retinopathy ([place]) 11/13/2015  . CHF (congestive heart failure) ([place]) 08/27/2015  . Iron deficiency anemia 08/03/2015  . Claudication ([place])   . Type 2 diabetes mellitus with stage 3 chronic kidney disease, with long-term current use of insulin ([place])   . Hematuria 06/27/2015  . Skin lesion of face 02/22/2015  . 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 Deficiency anemia 01/15/2015  . CKD (chronic kidney disease) stage 4, GFR 15-29 ml/min ([facility]) 01/15/2015  . Hypothyroidism 11/22/2014  . COPD GOLD III  02/16/2014  . History of tobacco use 08/23/2013  . 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 CAD (coronary artery disease) 04/06/2013  . PVD - hx of Rt SFA PTA and s/p Rt 4-5th toe amp Dec 2014 04/05/2013  . Essential hypertension 11/25/2012  . Mixed hyperlipidemia 07/17/2008    Past Surgical History:  Procedure Laterality Date  . AMPUTATION Right 01/28/2013   Procedure: RAY AMPUTATION RIGHT  4th & 5th TOE;  Surgeon: Rosetta Posner, MD;  Location: [facility] OR;  Service: Vascular;  Laterality: Right;  . ANGIOPLASTY / STENTING FEMORAL Right 01/13/2013   superficial femoral artery x 2 (3 mm x 60 mm, 4 mm x 60 mm)  . AV FISTULA PLACEMENT Left 02/11/2016   Procedure: LEFT UPPER ARM BRACHIAL CEPHALIC ARTERIOVENOUS (AV) FISTULA CREATION;  Surgeon: Conrad Second Mesa, MD;  Location: [facility];  Service: Vascular;  Laterality: Left;  . COLONOSCOPY W/ POLYPECTOMY    . CORONARY ARTERY BYPASS GRAFT N/A 04/07/2013   Procedure: CORONARY ARTERY BYPASS GRAFTING (CABG);  Surgeon: Ivin Poot, MD;  Location: [facility];  Service: Open Heart Surgery;  Laterality: N/A;  . INTRAOPERATIVE TRANSESOPHAGEAL ECHOCARDIOGRAM N/A 04/07/2013   Procedure: INTRAOPERATIVE TRANSESOPHAGEAL ECHOCARDIOGRAM;  Surgeon: Ivin Poot, MD;  Location: [facility];  Service: Open Heart Surgery;  Laterality: N/A;  . LEFT HEART CATHETERIZATION WITH CORONARY ANGIOGRAM N/A 04/05/2013   Procedure: LEFT HEART CATHETERIZATION WITH CORONARY ANGIOGRAM;   Surgeon: Peter M Martinique, MD;  Location: [facility] CATH LAB;  Service: Cardiovascular;  Laterality: N/A;  . LOWER EXTREMITY ANGIOGRAM Bilateral 01/06/2013   Procedure: LOWER EXTREMITY ANGIOGRAM;  Surgeon: Conrad Hillsdale, MD;  Location: [facility] CATH LAB;  Service: Cardiovascular;  Laterality: Bilateral;  . LOWER EXTREMITY ANGIOGRAM Right 01/13/2013   Procedure: LOWER EXTREMITY ANGIOGRAM;  Surgeon: Conrad Gove, MD;  Location: [facility] CATH LAB;  Service: Cardiovascular;  Laterality: Right;  . SKIN CANCER EXCISION  2017  . THROAT SURGERY  1983   polyps  . TONSILLECTOMY AND ADENOIDECTOMY  ~ 1965       Home Medications    Prior to Admission medications   Medication Sig Start Date End Date Taking? Authorizing Provider  allopurinol (ZYLOPRIM) 100 MG tablet Take 1 tablet (100 mg total) by mouth daily. 09/21/16   Arbutus Leas, NP  amLODipine (NORVASC) 10 MG tablet Take 1 tablet (10 mg total) by mouth daily. 02/19/16   Clegg, Amy D, NP  aspirin 81 MG chewable tablet Chew 81 mg by mouth daily.    [provider]  atorvastatin (LIPITOR) 40 MG tablet TAKE ONE (1) TABLET BY MOUTH EVERY DAY AT 6:00PM 11/17/16   Larey Dresser, MD  Blood Glucose Monitoring Suppl ([place]) w/Device KIT 1 kit by Does not apply route daily. 10/10/16   McDiarmid, Blane Ohara, MD  carvedilol (COREG) 25 MG tablet Take 2 tablets (50 mg total) by mouth 2 (two) times daily with a meal. 10/23/15   Tillery, Satira Mccallum, PA-C  cloNIDine (CATAPRES) 0.2 MG tablet TAKE TWO (2) TABLETS BY MOUTH 2 TIMES DAILY 11/11/16   Bensimhon, Shaune Pascal, MD  doxazosin (CARDURA) 8 MG tablet Take 1 tablet (8 mg total) by mouth every evening. 12/01/16   Zenia Resides, MD  ferrous sulfate 325 (65 FE) MG tablet TAKE ONE (1) TABLET BY MOUTH TWO (2) TIMES DAILY WITH A MEAL 10/07/16   [name], MD  glucose blood (ONETOUCH VERIO) test strip Use as instructed to test three times daily. ICD-10 code: E11.22 10/15/16   McDiarmid, Blane Ohara, MD    hydrALAZINE (APRESOLINE) 100 MG tablet TAKE ONE TABLET BY MOUTH EVERY EIGHT HOURS 11/14/16   [name], MD  insulin aspart (NOVOLOG FLEXPEN) 100 UNIT/ML FlexPen Inject 15 Units into the skin every evening.     [provider]  Insulin Glargine (LANTUS SOLOSTAR) 100 UNIT/ML Solostar Pen Inject 50 Units into the skin every morning. 10/10/16   [name], MD  isosorbide mononitrate (IMDUR) 60 MG 24 hr tablet Take 60 mg by mouth daily.    [provider]  levothyroxine (SYNTHROID, LEVOTHROID) 50 MCG tablet Take 50 mcg by mouth daily before breakfast.    [provider]  nitroGLYCERIN (NITROSTAT) 0.4 MG SL tablet Place 1 tablet (0.4 mg total) under the tongue every  5 (five) minutes as needed for chest pain. 11/14/16   [name], MD  [facility] DELICA LANCETS 06C MISC 1 each by Does not apply route 3 (three) times daily. ICD-10 code: E11.22 10/15/16   McDiarmid, Blane Ohara, MD  oxyCODONE-acetaminophen (PERCOCET) 5-325 MG tablet Take 2 tablets by mouth every 4 (four) hours as needed. Patient not taking: Reported on 12/01/2016 09/21/16   Charlesetta Shanks, MD  pantoprazole (PROTONIX) 40 MG tablet TAKE ONE (1) TABLET BY MOUTH EVERY DAY 09/30/16   [name], MD  potassium chloride SA (K-DUR,KLOR-CON) 20 MEQ tablet Take 20 mEq by mouth daily.    [provider]  torsemide (DEMADEX) 20 MG tablet Take 3 tablets (60 mg total) by mouth daily. May take an extra 20 mg Daily for weight gain above 212 lbs 09/17/16   Arbutus Leas, NP  traMADol (ULTRAM) 50 MG tablet Take 1 tablet (50 mg total) by mouth every 8 (eight) hours as needed. 11/14/16   [name], MD  warfarin (COUMADIN) 5 MG tablet Take 2.5-5 mg by mouth See admin instructions. Take 2.5 mg (1/2 tablet) daily except take 5 mg (1 tablet) on Monday and Friday    [provider]    Family History Family History  Problem Relation Age of Onset  . Cancer Mother        lung  cancer  . Hypertension Mother   . Cancer Sister        patient thinks it was uterine cancer  . Hypertension Sister   . Heart attack Sister   . Stroke Neg Hx     Social History Social History  Substance Use Topics  . Smoking status: Former Smoker    Packs/day: 1.00    Years: 46.00    Types: Cigarettes    Start date: 02/04/1967    Quit date: 02/03/2013  . Smokeless tobacco: Never Used     Comment: Doing well with quitting.  . Alcohol use No     Allergies   Patient has no known allergies.   Review of Systems Review of Systems  Respiratory: Negative for shortness of breath.   Cardiovascular: Positive for leg swelling. Negative for chest pain.  Musculoskeletal: Positive for arthralgias.  Neurological: Negative for weakness and numbness.  All other systems reviewed and are negative.    Physical Exam Updated Vital Signs BP (!) 121/52 (BP Location: Right Arm)   Pulse 68   Temp 98.4 F (36.9 C) (Oral)   Resp 18   Ht _0  (1.727 m)   Wt 89.4 kg (197 lb)   SpO2 100%   BMI 29.95 kg/m   Physical Exam  Constitutional: He is oriented to person, place, and time. He appears well-developed and well-nourished.  HENT:  Head: Normocephalic and atraumatic.  Right Ear: External ear normal.  Left Ear: External ear normal.  Nose: Nose normal.  Eyes: Right eye exhibits no discharge. Left eye exhibits no discharge.  Neck: Neck supple.  Cardiovascular:  Pulses:      Dorsalis pedis pulses are Detected w/ doppler on the right side, and Detected w/ doppler on the left side.  Pulmonary/Chest: Effort normal.  Abdominal: There is no tenderness.  Musculoskeletal: He exhibits edema.       Left ankle: He exhibits swelling. He exhibits normal range of motion. Tenderness. Lateral malleolus and medial malleolus tenderness found.       Left lower leg: He exhibits swelling. He exhibits no tenderness.  Left foot: There is tenderness (proximal) and swelling. There is normal range of motion.   Left leg from calf to foot is darker than right but is warm and same warmth as right. Normal strength/sensation  Neurological: He is alert and oriented to person, place, and time.  Skin: Skin is warm and dry.  Nursing note and vitals reviewed.    ED Treatments / Results  Labs (all labs ordered are listed, but only abnormal results are displayed) Labs Reviewed  COMPREHENSIVE METABOLIC PANEL - Abnormal; Notable for the following:       Result Value   Glucose, Bld 144 (*)    BUN 36 (*)    Creatinine, Ser 2.52 (*)    Calcium 8.4 (*)    Albumin 2.5 (*)    AST 10 (*)    ALT 7 (*)    GFR calc non Af Amer 25 (*)    GFR calc Af Amer 29 (*)    All other components within normal limits  PROTIME-INR - Abnormal; Notable for the following:    Prothrombin Time 20.0 (*)    All other components within normal limits  CBC WITH DIFFERENTIAL/PLATELET - Abnormal; Notable for the following:    RBC 3.27 (*)    Hemoglobin 7.1 (*)    HCT 23.6 (*)    MCV 72.2 (*)    MCH 21.7 (*)    RDW 20.2 (*)    All other components within normal limits  SEDIMENTATION RATE - Abnormal; Notable for the following:    Sed Rate 130 (*)    All other components within normal limits  CULTURE, BLOOD (ROUTINE X 2)  CULTURE, BLOOD (ROUTINE X 2)  C-REACTIVE PROTEIN  I-STAT CG4 LACTIC ACID, ED  I-STAT TROPONIN, ED    EKG  EKG Interpretation None       Radiology Dg Ankle Complete Left  Result Date: 12/01/2016 CLINICAL DATA:  66 year old diabetic presenting with swelling and discoloration to the left lower leg with pain in the left foot and ankle. Personal history of DVT. Current history of gout. EXAM: LEFT ANKLE COMPLETE - 3+ VIEW COMPARISON:  None. FINDINGS: Diffuse soft tissue swelling/edema. No evidence of acute or subacute fracture or dislocation. Ankle mortise intact with well-preserved joint space. Modeling of the talus with widening of the subtalar joint and possible lucencies within the medial talus and the  talar dome. Well-preserved bone mineral density. Arterial calcification involving the tibioperoneal arteries, the plantar arteries and the dorsalis pedis artery. IMPRESSION: 1. 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 2. No acute osseous abnormalities otherwise. Electronically Signed   By: Evangeline Dakin M.D.   On: 12/01/2016 13:08   Dg Foot Complete Left  Result Date: 12/01/2016 CLINICAL DATA:  Left leg swelling and discoloration. EXAM: LEFT FOOT - COMPLETE 3+ VIEW COMPARISON:  Radiographs of September 21, 2016. FINDINGS: Vascular calcifications are noted. Lytic destruction is seen involving the proximal base of the first metatarsal concerning for osteomyelitis. There also appears to be lytic destruction involving the superior and posterior aspect of the calcaneus. No definite fracture is noted. Joint spaces are intact. IMPRESSION: Lytic destruction is seen involving the proximal base of the first metatarsal as well as the superior and posterior portion of the calcaneus concerning for osteomyelitis. MRI may be performed further evaluation. Electronically Signed   By: Marijo Conception, M.D.   On: 12/01/2016 13:08    Procedures Procedures (including critical care time)  Medications  Ordered in ED Medications  vancomycin (VANCOCIN) 2,000 mg in sodium chloride 0.9 % 500 mL IVPB (not administered)  vancomycin (VANCOCIN) 1,250 mg in sodium chloride 0.9 % 250 mL IVPB (not administered)  cefTRIAXone (ROCEPHIN) 2 g in dextrose 5 % 50 mL IVPB (not administered)     Initial Impression / Assessment and Plan / ED Course  I have reviewed the triage vital signs and the nursing notes.  Pertinent labs & imaging results that were available during my care of the patient were reviewed by me and considered in my medical decision making (see chart for details).  Clinical Course as of Dec 01 1600  Mon Dec 01, 2016  1458 D/w  orthopedics, Hilbert Odor, who will see in consult. Is ok with given antibiotics now. Consult family practice for admission  [SG]    Clinical Course User Index [SG] Sherwood Gambler, MD    Patient's x-rays show 2 different areas of osteomyelitis.  He is hemodynamically stable.  Orthopedics consulted.  Given diagnosis appears made, hold on MRI at this time.  Vancomycin and Rocephin.  DVT ultrasound shows no acute DVT.  There is mention of a possible septic arthritis in the ankle but he has full active and passive range of motion without discomfort or limitation in that ankle.  I think this is less likely.  He will be admitted to family practice.  Final Clinical Impressions(s) / ED Diagnoses   Final diagnoses:  Acute osteomyelitis of left foot ([place])  Acute osteomyelitis of left talus [facility])    New Prescriptions New Prescriptions   No medications on file     Sherwood Gambler, MD 12/01/16 [phone]

## 2016-12-01 NOTE — Progress Notes (Signed)
    S:     Chief Complaint  Patient presents with  . Medication Management    Diabetes    Patient arrives ambulating with a walker, accompanied by his sister [name], and in typical positive mood.  Presents for diabetes evaluation, education, and management at the request of his PCP, Dr. Juanito Doom.  He has been seen multiple time in the past for adherence, glycemica dn blood pressure control.  Patient was referred on 11/14/16 for possible CGM monitoring. Patient was last seen by Primary Care Provider on 11/14/2016.     Patient reports adherence with medications.  Currently using med-box refill assistance.  Current diabetes medications include: Lantus plus novolog (two shots daily) Current hypertension medications includes: Clonidine 0.2mg  BID And Doxazosin 8mg  BID  Patient denies hypoglycemic events.  Patient reported dietary habits: Eats 2-3 meals/day Breakfast:Minimal and does not want to take AM dose of Novolog Lunch:consistent intake of significant meal and taking meal time insulin Dinner: largest meal of the day and reports taking meal time insulin.  Snacks:minimal per his report Drinks:water  Patient reported exercise habits: Minimal due to dizziness   Patient reports self foot exams.   .[email or web address]   O:  Physical Exam  Constitutional: He appears well-developed and well-nourished.  Musculoskeletal: He exhibits deformity.  Skin: There is erythema.    Review of Systems  Cardiovascular: Positive for leg swelling.  Neurological: Positive for dizziness.  All other systems reviewed and are negative.    Lab Results  Component Value Date   HGBA1C 11.0 10/10/2016   There were no vitals filed for this visit.  Home fasting CBG: 100-150 per patient report  A/P: Diabetes longstanding poor control due to lack of meal time dosing admustment due to variability in meal intake.  Patient denies hypoglycemic events and is able to verbalize appropriate hypoglycemia management  plan. Patient reports adherence with medication however denies use of AM rapid insulin due to lack of eating a consistent breakfast.  Continued same doses of lantus and novolog given lack of later day dosing CBGs. CGM monitoring will be placed in the future once lower extremity symptomatology improve.   Left lower extremity pain, erythema and swelling vs right worsening over the past two weeks during period of therapeutic or near therapeutic INR in patient with clot history.   Worsening was concering and Dr. Gwendlyn Deutscher and PCP - Juanito Doom visualized left foot with worsened symptomatology.  Minimal edema in right lower extremity however the small lesion on right medial surface of his great toe is was concerning.  Encouraged good hygiene and use of ointment with band-aid to keep moist to allow healing.   Hypertension longstanding with dizziness currently in good control in office today.  Patient reports adherence with medication by using pill box. Control is likely improved due to use of pill box and frequent checks on utilization.     Written patient instructions provided.  Total time in face to face counseling 35 minutes.   Follow up in Pharmacist Clinic Visit PRN.  PCP - Gambino in 1-2 weeks.    Patient seen with Drusilla Kanner, PharmD Candidate.

## 2016-12-01 NOTE — ED Triage Notes (Signed)
Per Pt, Pt was sent from MD after having increased swelling and discoloration to the left leg. Pt has Hx of DVT and Md wanted to r/o recurrent.

## 2016-12-02 ENCOUNTER — Inpatient Hospital Stay (HOSPITAL_COMMUNITY): Payer: Medicaid Other

## 2016-12-02 ENCOUNTER — Inpatient Hospital Stay (HOSPITAL_COMMUNITY): Admission: RE | Admit: 2016-12-02 | Payer: Medicare Other | Source: Ambulatory Visit

## 2016-12-02 DIAGNOSIS — M1A0720 Idiopathic chronic gout, left ankle and foot, without tophus (tophi): Secondary | ICD-10-CM

## 2016-12-02 LAB — CBC
HEMATOCRIT: 23.6 % — AB (ref 39.0–52.0)
Hemoglobin: 7.1 g/dL — ABNORMAL LOW (ref 13.0–17.0)
MCH: 21.5 pg — ABNORMAL LOW (ref 26.0–34.0)
MCHC: 30.1 g/dL (ref 30.0–36.0)
MCV: 71.5 fL — ABNORMAL LOW (ref 78.0–100.0)
Platelets: 278 10*3/uL (ref 150–400)
RBC: 3.3 MIL/uL — ABNORMAL LOW (ref 4.22–5.81)
RDW: 20.2 % — ABNORMAL HIGH (ref 11.5–15.5)
WBC: 6.2 10*3/uL (ref 4.0–10.5)

## 2016-12-02 LAB — CBG MONITORING, ED: Glucose-Capillary: 137 mg/dL — ABNORMAL HIGH (ref 65–99)

## 2016-12-02 LAB — HEPARIN LEVEL (UNFRACTIONATED)
HEPARIN UNFRACTIONATED: 0.43 [IU]/mL (ref 0.30–0.70)
Heparin Unfractionated: 0.23 IU/mL — ABNORMAL LOW (ref 0.30–0.70)

## 2016-12-02 LAB — URIC ACID: URIC ACID, SERUM: 7.7 mg/dL — AB (ref 4.4–7.6)

## 2016-12-02 LAB — GLUCOSE, CAPILLARY
GLUCOSE-CAPILLARY: 148 mg/dL — AB (ref 65–99)
GLUCOSE-CAPILLARY: 142 mg/dL — AB (ref 65–99)

## 2016-12-02 MED ORDER — INSULIN GLARGINE 100 UNIT/ML ~~LOC~~ SOLN
50.0000 [IU] | Freq: Every morning | SUBCUTANEOUS | Status: DC
  Administered 2016-12-03: 50 [IU] via SUBCUTANEOUS
  Filled 2016-12-02 (×2): qty 0.5

## 2016-12-02 MED ORDER — HYDRALAZINE HCL 50 MG PO TABS
50.0000 mg | ORAL_TABLET | Freq: Three times a day (TID) | ORAL | Status: DC
  Administered 2016-12-02 – 2016-12-04 (×6): 50 mg via ORAL
  Filled 2016-12-02 (×6): qty 1

## 2016-12-02 MED ORDER — ISOSORBIDE MONONITRATE ER 60 MG PO TB24
60.0000 mg | ORAL_TABLET | Freq: Every day | ORAL | Status: DC
  Administered 2016-12-02 – 2016-12-03 (×2): 60 mg via ORAL
  Filled 2016-12-02 (×2): qty 1

## 2016-12-02 MED ORDER — ALLOPURINOL 100 MG PO TABS
100.0000 mg | ORAL_TABLET | Freq: Two times a day (BID) | ORAL | Status: DC
  Administered 2016-12-02 – 2016-12-03 (×4): 100 mg via ORAL
  Filled 2016-12-02 (×4): qty 1

## 2016-12-02 MED ORDER — WARFARIN SODIUM 5 MG PO TABS
5.0000 mg | ORAL_TABLET | Freq: Once | ORAL | Status: AC
  Administered 2016-12-02: 5 mg via ORAL
  Filled 2016-12-02: qty 1

## 2016-12-02 MED ORDER — COLCHICINE 0.6 MG PO TABS
0.6000 mg | ORAL_TABLET | Freq: Two times a day (BID) | ORAL | Status: DC
  Administered 2016-12-02 – 2016-12-03 (×4): 0.6 mg via ORAL
  Filled 2016-12-02 (×4): qty 1

## 2016-12-02 MED ORDER — WARFARIN - PHARMACIST DOSING INPATIENT
Freq: Every day | Status: DC
  Administered 2016-12-02: 18:00:00

## 2016-12-02 MED ORDER — PIPERACILLIN-TAZOBACTAM 3.375 G IVPB
3.3750 g | Freq: Three times a day (TID) | INTRAVENOUS | Status: DC
  Filled 2016-12-02 (×2): qty 50

## 2016-12-02 MED ORDER — INSULIN GLARGINE 100 UNIT/ML ~~LOC~~ SOLN
25.0000 [IU] | Freq: Once | SUBCUTANEOUS | Status: DC
  Filled 2016-12-02 (×2): qty 0.25

## 2016-12-02 NOTE — Consult Note (Signed)
ORTHOPAEDIC CONSULTATION  REQUESTING PHYSICIAN: Alveda Reasons, MD  Chief Complaint: Left foot and ankle pain.  HPI: Leonard [name] is a 66 y.o. male who presents with patient is a 66 year old gentleman with diabetic insensate neuropathy severe peripheral vascular disease with history of gout.  Patient presents complaining of increasing left ankle and foot pain.  Past Medical History:  Diagnosis Date  . Cancer ([place])    skin cancer-  . CHF (congestive heart failure) ([place])   . Chronic kidney disease    not on dialysis  . Coronary artery disease   . Gangrene of toe ([place])    right 5th/notes 01/04/2013   . High cholesterol   . Hypertension   . Iron deficiency anemia 08/03/2015  . 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 PAD (peripheral artery disease) ([place])   . Shortness of breath dyspnea    with exertion  . Type II diabetes mellitus ([place])    Type II   Past Surgical History:  Procedure Laterality Date  . AMPUTATION Right 01/28/2013   Procedure: RAY AMPUTATION RIGHT  4th & 5th TOE;  Surgeon: Rosetta Posner, MD;  Location: [facility] OR;  Service: Vascular;  Laterality: Right;  . ANGIOPLASTY / STENTING FEMORAL Right 01/13/2013   superficial femoral artery x 2 (3 mm x 60 mm, 4 mm x 60 mm)  . AV FISTULA PLACEMENT Left 02/11/2016   Procedure: LEFT UPPER ARM BRACHIAL CEPHALIC ARTERIOVENOUS (AV) FISTULA CREATION;  Surgeon: Conrad Navassa, MD;  Location: [place];  Service: Vascular;  Laterality: Left;  . COLONOSCOPY W/ POLYPECTOMY    . CORONARY ARTERY BYPASS GRAFT N/A 04/07/2013   Procedure: CORONARY ARTERY BYPASS GRAFTING (CABG);  Surgeon: Ivin Poot, MD;  Location: [place];  Service: Open Heart Surgery;  Laterality: N/A;  . INTRAOPERATIVE TRANSESOPHAGEAL ECHOCARDIOGRAM N/A 04/07/2013   Procedure: INTRAOPERATIVE TRANSESOPHAGEAL ECHOCARDIOGRAM;  Surgeon: Ivin Poot, MD;  Location: [place];  Service: Open Heart Surgery;  Laterality: N/A;  . LEFT HEART CATHETERIZATION  WITH CORONARY ANGIOGRAM N/A 04/05/2013   Procedure: LEFT HEART CATHETERIZATION WITH CORONARY ANGIOGRAM;  Surgeon: Peter M Martinique, MD;  Location: [facility] CATH LAB;  Service: Cardiovascular;  Laterality: N/A;  . LOWER EXTREMITY ANGIOGRAM Bilateral 01/06/2013   Procedure: LOWER EXTREMITY ANGIOGRAM;  Surgeon: Conrad Landmark, MD;  Location: [facility] CATH LAB;  Service: Cardiovascular;  Laterality: Bilateral;  . LOWER EXTREMITY ANGIOGRAM Right 01/13/2013   Procedure: LOWER EXTREMITY ANGIOGRAM;  Surgeon: Conrad Timnath, MD;  Location: [facility] CATH LAB;  Service: Cardiovascular;  Laterality: Right;  . SKIN CANCER EXCISION  2017  . THROAT SURGERY  1983   polyps  . TONSILLECTOMY AND ADENOIDECTOMY  ~ 46   Social History   Social History  . Marital status: Single    Spouse name: N/A  . Number of children: N/A  . Years of education: N/A   Social History Main Topics  . Smoking status: Former Smoker    Packs/day: 1.00    Years: 46.00    Types: Cigarettes    Start date: 02/04/1967    Quit date: 02/03/2013  . Smokeless tobacco: Never Used     Comment: Doing well with quitting.  . Alcohol use No  . Drug use: No  . Sexual activity: Not Currently   Other Topics Concern  . None   Social History Narrative   Lives with sister, [name] in [place].  Retired - former Sports coach.   Family History  Problem Relation Age  of Onset  . Cancer Mother        lung cancer  . Hypertension Mother   . Cancer Sister        patient thinks it was uterine cancer  . Hypertension Sister   . Heart attack Sister   . Stroke Neg Hx    - negative except otherwise stated in the family history section No Known Allergies Prior to Admission medications   Medication Sig Start Date End Date Taking? Authorizing Provider  allopurinol (ZYLOPRIM) 100 MG tablet Take 1 tablet (100 mg total) by mouth daily. 09/21/16  Yes Arbutus Leas, NP  amLODipine (NORVASC) 10 MG tablet Take 1 tablet (10 mg total) by mouth daily. 02/19/16  Yes Clegg, Amy D,  NP  aspirin 81 MG chewable tablet Chew 81 mg by mouth daily.   Yes [provider]  atorvastatin (LIPITOR) 40 MG tablet TAKE ONE (1) TABLET BY MOUTH EVERY DAY AT 6:00PM Patient taking differently: Take 40 mg by mouth once a day at 6 PM 11/17/16  Yes Larey Dresser, MD  carvedilol (COREG) 25 MG tablet Take 2 tablets (50 mg total) by mouth 2 (two) times daily with a meal. 10/23/15  Yes Tillery, Satira Mccallum, PA-C  cloNIDine (CATAPRES) 0.2 MG tablet TAKE TWO (2) TABLETS BY MOUTH 2 TIMES DAILY Patient taking differently: Take 0.4 mg by mouth two times a day 11/11/16  Yes Bensimhon, Shaune Pascal, MD  Darbepoetin Alfa (ARANESP) 500 MCG/ML SOSY injection Inject 500 mcg into the skin every 14 (fourteen) days.    Yes [provider]  doxazosin (CARDURA) 8 MG tablet Take 1 tablet (8 mg total) by mouth every evening. 12/01/16  Yes Hensel, Jamal Collin, MD  ferrous sulfate 325 (65 FE) MG tablet TAKE ONE (1) TABLET BY MOUTH TWO (2) TIMES DAILY WITH A MEAL Patient taking differently: Take 325 mg by mouth two times a day with a meal 10/07/16  Yes [name], MD  ferumoxytol 510 mg in sodium chloride 0.9 % 100 mL Inject into the vein every 7 (seven) days.    Yes [provider]  hydrALAZINE (APRESOLINE) 100 MG tablet TAKE ONE TABLET BY MOUTH EVERY EIGHT HOURS Patient taking differently: Take 100 mg by mouth every 8 hours 11/14/16  Yes Gambino, Arlie Solomons, MD  insulin aspart (NOVOLOG FLEXPEN) 100 UNIT/ML FlexPen Inject 15 Units into the skin every evening.    Yes [provider]  Insulin Glargine (LANTUS SOLOSTAR) 100 UNIT/ML Solostar Pen Inject 50 Units into the skin every morning. Patient taking differently: Inject 50 Units into the skin daily after breakfast.  10/10/16  Yes Gambino, Arlie Solomons, MD  isosorbide mononitrate (IMDUR) 60 MG 24 hr tablet Take 60 mg by mouth daily.   Yes [provider]  levothyroxine (SYNTHROID, LEVOTHROID) 50 MCG tablet Take 50 mcg by  mouth daily before breakfast.   Yes [provider]  nitroGLYCERIN (NITROSTAT) 0.4 MG SL tablet Place 1 tablet (0.4 mg total) under the tongue every 5 (five) minutes as needed for chest pain. 11/14/16  Yes [name], MD  pantoprazole (PROTONIX) 40 MG tablet TAKE ONE (1) TABLET BY MOUTH EVERY DAY Patient taking differently: Take 40 mg by mouth once a day 09/30/16  Yes Gambino, Arlie Solomons, MD  potassium chloride SA (K-DUR,KLOR-CON) 20 MEQ tablet Take 20 mEq by mouth daily.   Yes [provider]  torsemide (DEMADEX) 20 MG tablet Take 3 tablets (60 mg total) by mouth daily. May take an  extra 20 mg Daily for weight gain above 212 lbs Patient taking differently: Take 20 mg by mouth 3 (three) times daily. And may take an extra 20 mg daily for weight gain above 212 lbs 09/17/16  Yes Arbutus Leas, NP  traMADol (ULTRAM) 50 MG tablet Take 1 tablet (50 mg total) by mouth every 8 (eight) hours as needed. Patient taking differently: Take 50 mg by mouth every 8 (eight) hours as needed (for pain).  11/14/16  Yes [name], MD  warfarin (COUMADIN) 5 MG tablet Take 2.5-5 mg by mouth See admin instructions. 2.5 mg in the evening on Sun/Mon/Tues/Wed/Thurs/Sat and 5 mg on Fri   Yes [provider]  Blood Glucose Monitoring Suppl ([place]) w/Device KIT 1 kit by Does not apply route daily. 10/10/16   McDiarmid, Blane Ohara, MD  glucose blood (ONETOUCH VERIO) test strip Use as instructed to test three times daily. ICD-10 code: E11.22 10/15/16   McDiarmid, Blane Ohara, MD  [facility] DELICA LANCETS 92T MISC 1 each by Does not apply route 3 (three) times daily. ICD-10 code: E11.22 10/15/16   McDiarmid, Blane Ohara, MD   Dg Ankle Complete Left  Result Date: 12/01/2016 CLINICAL DATA:  66 year old diabetic presenting with swelling and discoloration to the left lower leg with pain in the left foot and ankle. Personal history of DVT. Current history of gout. EXAM: LEFT ANKLE COMPLETE  - 3+ VIEW COMPARISON:  None. FINDINGS: Diffuse soft tissue swelling/edema. No evidence of acute or subacute fracture or dislocation. Ankle mortise intact with well-preserved joint space. Modeling of the talus with widening of the subtalar joint and possible lucencies within the medial talus and the talar dome. Well-preserved bone mineral density. Arterial calcification involving the tibioperoneal arteries, the plantar arteries and the dorsalis pedis artery. IMPRESSION: 1. 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 2. No acute osseous abnormalities otherwise. Electronically Signed   By: Evangeline Dakin M.D.   On: 12/01/2016 13:08   Dg Foot Complete Left  Result Date: 12/01/2016 CLINICAL DATA:  Left leg swelling and discoloration. EXAM: LEFT FOOT - COMPLETE 3+ VIEW COMPARISON:  Radiographs of September 21, 2016. FINDINGS: Vascular calcifications are noted. Lytic destruction is seen involving the proximal base of the first metatarsal concerning for osteomyelitis. There also appears to be lytic destruction involving the superior and posterior aspect of the calcaneus. No definite fracture is noted. Joint spaces are intact. IMPRESSION: Lytic destruction is seen involving the proximal base of the first metatarsal as well as the superior and posterior portion of the calcaneus concerning for osteomyelitis. MRI may be performed further evaluation. Electronically Signed   By: Marijo Conception, M.D.   On: 12/01/2016 13:08   - pertinent xrays, CT, MRI studies were reviewed and independently interpreted  Positive ROS: All other systems have been reviewed and were otherwise negative with the exception of those mentioned in the HPI and as above.  Physical Exam: General: Alert, no acute distress Psychiatric: Patient is competent for consent with normal mood and affect Lymphatic: No axillary or cervical lymphadenopathy Cardiovascular:  No pedal edema Respiratory: No cyanosis, no use of accessory musculature GI: No organomegaly, abdomen is soft and non-tender  Skin: Examination patient's left foot and ankle skin is thin and atrophic.  There is no redness no cellulitis no fluctuance.   Neurologic: Patient does not have protective sensation bilateral lower extremities.   MUSCULOSKELETAL:  Examination patient does not had a  palpable dorsalis pedis or posterior tibial pulse.  Patient has pain to palpation globally around the foot and ankle.  There is no cellulitis no draining ulcers no signs of infection.  Patient's MRI scan was reviewed from August which showed periarticular destructive changes consistent with gout.  His uric acid level in August was 10.9.  Patient is only taking 100 mg allopurinol daily.  Review of the plain radiographs shows destructive bony change to the base of the first metatarsal as well as periarticular cystic changes throughout the ankle and destructive bony changes of the subtalar joint.  Radiographs show severe calcification throughout the foot and ankle of the vascular structures.  Hemoglobin 7.0, albumin 2.5.  Assessment: Assessment: Gout involving the left ankle subtalar joint and midfoot.  With diabetic insensate neuropathy and anemia.  Plan: Plan: Orders were written for allopurinol 100 mg twice a day colchicine 0.6 mg twice a day.  I will write an order for a fracture boot to help immobilize the ankle to provide support and comfort.  I can follow-up in the office as an outpatient.  Thank you for the consult and the opportunity to see Mr. Keenan Bachelor, MD [place] [phone] 8:14 AM

## 2016-12-02 NOTE — Progress Notes (Signed)
Pharmacy Antibiotic Note  Leonard [name] is a 66 y.o. male admitted on 12/01/2016 with osteomyelitis.  Pharmacy has been consulted for zosyn. Afebrile and WBC is WNL. Scr elevated at 2.52. Foot Xray: concerning for osteomyelitis.   Plan: Zosyn 3.375g IV Q8H (4 hr inf) Continue vanc as ordered F/u renal fxn, C&S, clinical status    Height: 5\' 8"  (172.7 cm) Weight: 197 lb (89.4 kg) IBW/kg (Calculated) : 68.4  Temp (24hrs), Avg:98.2 F (36.8 C), Min:98.2 F (36.8 C), Max:98.2 F (36.8 C)   Recent Labs Lab 12/01/16 1400 12/01/16 1436 12/02/16 0455  WBC 8.0  --  6.2  CREATININE 2.52*  --   --   LATICACIDVEN  --  1.08  --     Estimated Creatinine Clearance: 31.7 mL/min (A) (by C-G formula based on SCr of 2.52 mg/dL (H)).    No Known Allergies  Antimicrobials this admission: Vancomycin 10/29 >>  Ceftriaxone 10/29 >> 10/30 Zosyn 10/30>>  Microbiology results: 10/29 BCx:   Thank you for allowing pharmacy to be a part of this patient's care.  Leonard [name], Leonard [name] 12/02/2016 11:31 AM

## 2016-12-02 NOTE — Progress Notes (Signed)
ANTICOAGULATION CONSULT NOTE  Pharmacy Consult for heparin Indication: history of DVT- anticoag on warfarin PTA  No Known Allergies  Patient Measurements: Height: 5\' 8"  (172.7 cm) Weight: 197 lb (89.4 kg) IBW/kg (Calculated) : 68.4 Heparin Dosing Weight: 86.7  Vital Signs: Temp: 97.9 F (36.6 C) (10/30 1233) Temp Source: Oral (10/30 1233) BP: 166/68 (10/30 1233) Pulse Rate: 72 (10/30 1233)  Labs:  Recent Labs  12/01/16 1400 12/02/16 0455 12/02/16 0500 12/02/16 1331  HGB 7.1* 7.1*  --   --   HCT 23.6* 23.6*  --   --   PLT 291 278  --   --   LABPROT 20.0*  --   --   --   INR 1.72  --   --   --   HEPARINUNFRC  --   --  <0.10* 0.23*  CREATININE 2.52*  --   --   --     Estimated Creatinine Clearance: 31.7 mL/min (A) (by C-G formula based on SCr of 2.52 mg/dL (H)).   Assessment: 41 YOM admitted with swelling of LLE. Pharmacy consulted for heparin dosing for history of DVT - on warfarin PTA. Last dose PTA 10/28. Hgb 7.1 stable, Plt wnl no bleeding noted. DVT doppler negative. INR 1.72. Warfarin being held for ortho consult. Heparin level remains low at 0.23 after rate increase. No bleeding noted.   Goal of Therapy:   Heparin level 0.3-0.7 units/ml Monitor platelets by anticoagulation protocol: Yes   Plan:  Increase heparin infusion to 2000 units/hr  Check an 8 hr heparin level Daily heparin level and CBC  Salome Arnt, PharmD, BCPS 12/02/2016 2:25 PM

## 2016-12-02 NOTE — ED Notes (Signed)
Family members x 2 visiting w/pt.

## 2016-12-02 NOTE — ED Notes (Signed)
MRI Tech advised will transport pt to 2W03 after MRI completed.

## 2016-12-02 NOTE — Consult Note (Addendum)
[place] Nurse wound consult note Consult requested for foot prior to ortho service involvement.  Dr Sharol Given is now following for assessment and plan of care; please refer to ortho team for further questions. Please re-consult if further assistance is needed.  Thank-you,  Julien Girt MSN, [place], [place], [place], [place]

## 2016-12-02 NOTE — ED Notes (Signed)
Family members x 2 visiting w/pt. Newspaper given to pt as requested.

## 2016-12-02 NOTE — ED Notes (Signed)
Wound care RN w/pt.

## 2016-12-02 NOTE — Progress Notes (Signed)
ANTICOAGULATION CONSULT NOTE  Pharmacy Consult for heparin to warfarin Indication: history of DVT  No Known Allergies  Patient Measurements: Height: 5\' 8"  (172.7 cm) Weight: 197 lb (89.4 kg) IBW/kg (Calculated) : 68.4 Heparin Dosing Weight: 86.7  Vital Signs: Temp: 98 F (36.7 C) (10/30 1639) Temp Source: Oral (10/30 1639) BP: 159/65 (10/30 1639) Pulse Rate: 70 (10/30 1639)  Labs:  Recent Labs  12/01/16 1400 12/02/16 0455 12/02/16 0500 12/02/16 1331  HGB 7.1* 7.1*  --   --   HCT 23.6* 23.6*  --   --   PLT 291 278  --   --   LABPROT 20.0*  --   --   --   INR 1.72  --   --   --   HEPARINUNFRC  --   --  <0.10* 0.23*  CREATININE 2.52*  --   --   --     Estimated Creatinine Clearance: 31.7 mL/min (A) (by C-G formula based on SCr of 2.52 mg/dL (H)).   Assessment: 32 YOM admitted with swelling of LLE. Pharmacy consulted to resume warfarin for history of DVT, also on IV heparin. Hgb 7.1 stable, Plt wnl no bleeding noted. DVT doppler negative. INR 1.72 on 10/29.    Goal of Therapy:   INR 2-3 Monitor platelets by anticoagulation protocol: Yes   Plan:  Warfarin 5 mg PO tonight Daily INR Monitor for s/sx of bleeding   Renold Genta, PharmD, BCPS Clinical Pharmacist Phone for today - [place] - [phone] 12/02/2016 4:53 PM

## 2016-12-02 NOTE — Consult Note (Signed)
[place] Nurse wound consult note Reason for Consult: evaluate wound on the right great toe. Orthopedic surgeon has evaluated the left foot and will follow patient as outpatient for gout involving the left ankle subtalar joint and midfoot. No other surgical intervention needed at this time.  Wound type:  Right great toe, with history of PAD,  foot ulceration with 100% eschar.  Pressure Injury POA: NA Measurement:1cm x 2.0cm x 0.01 (tiny fissured area centrally) Wound bed: 100% dark, eschar, not fluctuant, no associated cellulitis  Drainage (amount, consistency, odor) none at the time of my assessment Periwound: intact, no edema, palpable pulse in the right DP/PT (I did not assess the left foot) Dressing procedure/placement/frequency: Unclear, but patient reports his sister may have "lost" some ointment that Nicholas Lose gave him.  I have reviewed the chart and it appears he met with OP pharmacist Janeann Forehand on 12/01/16 and was told to use ointment and cover with a bandaid to keep moist.  However with his history of PAD and uncontrolled diabetes I fear that opening this wound would not be the best option.  For that reason I would recommend painting the area with betadine swab daily and leaving it open to air.    Orders written  FU with Dr. Sharol Given as outpatient and regular medical MD   Re consult if needed, will not follow at this time. Thanks  Robina Hamor [employer], RN,CWOCN, CNS, [place] [phone])

## 2016-12-02 NOTE — ED Notes (Signed)
Per Pilar Plate, Pharmacist, Heparin and Zosyn are compatible.

## 2016-12-02 NOTE — ED Notes (Signed)
Pt CBG was 182, notified Rebecca(RN)

## 2016-12-02 NOTE — Progress Notes (Signed)
Family Medicine Teaching Service Daily Progress Note Intern Pager: [phone]  [name] Medical record number: [id number] Date of birth: [birth year] Age: 66 y.o. Gender: male  Primary Care Provider: [name], MD Consultants: orthopedics Code Status: full   Pt Overview and Major Events to Date:  Admitted to [facility] on 10/29  Assessment and Plan: Leonard [name] is a 66 y.o. male presenting with left ankle and foot swelling x 1 mo. PMH is significant for hyperlipidemia, HTN, CAD s/p CABG, tobacco abuse, CKD stage 4, uncontrolled T2DM, and COPD.   LLE swelling/left foot osteomyelitis Patient complains of left ankle and foot swelling and pain x 23monthin duration 2/2 osteomyelitis. Xray in ED showing lytic destruction of proximal base on first metatarsal and superior/posterior portion of calcaneus concerning for osteomyelits. ESR and CRP elevated to 130 and 7.3 respectively. 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 Have recommended allopurinol '100mg'$  bid and colchicine 0.'6mg'$  bid. Patient will need fracture boot to immobilize ankle and will need outpatient f/u.  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 On warfarin at home. Lower extremity DVT Doppler negative on admission. PT/INR subtherapeutic on 10/29 at 1.72.  -heprin gtt in preparation for possible orthopedic surgery  -hold home warfarin  T2DM: Poorly controlled. Last A1c 11.0 on 9/70/2018 On novolog 15 units in pm, lantus 50 units in am.   -monitor CBG -monitor A1C -sliding scale insulin  Right toe wound Fresh-looking wound with bloody scab on his  right toe. No signs of infection.  -Wound care consult  Anemia Hgb today of 7.1. Per chart review patient is chronically low, but given hx of CAD will likely need Hgb >8.  -consider transfusion given cardiac hx and possibility of surgery  HTN  Home medications: amlodipine '10mg'$ , carvedilol '50mg'$ , clonidine 0.'4mg'$ , doxazosin 8 mg, Imdur & hydralazine 100 mg  -carvedilol '25mg'$  twice a day. On 50 mg twice a day at home -Continue home clonidine -Hold amlodipine, doxazosin, hydralazine and Imdur. Will introduce as BP allows  HLD Home medications: atorvastatin '40mg'$   -continue home medications   CHF Home medications: torsemide '60mg'$ . Echo on 06/05/2015 showing LV EF of 60-65% with normal systolic function. Patient denies symptoms of dyspnea or orthopnea. Edema on exam was unilateral. Patient appeared euvolemic on admission.   -monitor Is&Os -Daily weight  CAD s/p CABG in 2015/PAD No anginal symptoms. Barely palpable pulses in his lower legs and foot.  Home medications: ASA '81mg'$ , imdur '60mg'$ , nitroclycerin prn -May reintroduce Imdur if BP allows -Continue home nitroglycerin  CKD stage 4 Serum creatinine better than baseline at 2.52. Per chart review patient was to get AVF and is assigned to Dr. CMarval [facility]. However, does not appear to see nephrology. Patient is still making urine.  -continue to monitor renal function with daily BMP -consider consultation with nephrology while inpatient -will need to inquire further into outpatient nephrology workup   COPD not on any home medications. No complaints of dyspnea or cough. Lungs CTAB. -prn duonebs  -continue to monitor   Hypothyroidism Home medications: synthroid 50 mcg -continue home medications   Tobacco abuse  Patient reports former smoking history. Patient quit smoking 3 years ago when  he had an MI.   FEN/GI: NPO for surgery  PPx: heparin gtt  Disposition: awaiting evaluation by Dr. Sharol Given, likely surgical procedure    Subjective:  Patient today with no complaints. Denies pain in foot. Denies fever, chills, nausea, vomiting, and diarrhea.   Objective: Temp:  [98.2 F (36.8 C)] 98.2 F (36.8 C) (10/29 2049) Pulse Rate:  [70-86] 74 (10/30 0742) Resp:  [16-18] 18 (10/30 0742) BP: (141-163)/(56-78) 145/56 (10/30 0742) SpO2:  [97 %-100 %] 100 % (10/30 0742) Physical Exam: General: awake and alert, laying in bed, NAD Cardiovascular: RRR, no MRG Respiratory: CTAB Abdomen: soft, non tender, non distended, bowel sounds normal  Extremities: edema of left foot and ankle, tender to palpation, right toe wound stable from exam yesterday, unable to palpate distal pulses in lower extremities   Laboratory:  Recent Labs Lab 12/01/16 1400 12/02/16 0455  WBC 8.0 6.2  HGB 7.1* 7.1*  HCT 23.6* 23.6*  PLT 291 278    Recent Labs Lab 12/01/16 1400  NA 136  K 4.1  CL 102  CO2 23  BUN 36*  CREATININE 2.52*  CALCIUM 8.4*  PROT 6.8  BILITOT 0.3  ALKPHOS 89  ALT 7*  AST 10*  GLUCOSE 144*    Imaging/Diagnostic Tests: Dg Ankle Complete Left  Result Date: 12/01/2016 CLINICAL DATA:  66 year old diabetic presenting with swelling and discoloration to the left lower leg with pain in the left foot and ankle. Personal history of DVT. Current history of gout. EXAM: LEFT ANKLE COMPLETE - 3+ VIEW COMPARISON:  None. FINDINGS: Diffuse soft tissue swelling/edema. No evidence of acute or subacute fracture or dislocation. Ankle mortise intact with well-preserved joint space. Modeling of the talus with widening of the subtalar joint and possible lucencies within the medial talus and the talar dome. Well-preserved bone mineral density. Arterial calcification involving the tibioperoneal arteries, the plantar arteries and the dorsalis pedis artery. IMPRESSION: 1. 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 2. No acute osseous abnormalities  otherwise. Electronically Signed   By: Evangeline Dakin M.D.   On: 12/01/2016 13:08   Dg Foot Complete Left  Result Date: 12/01/2016 CLINICAL DATA:  Left leg swelling and discoloration. EXAM: LEFT FOOT - COMPLETE 3+ VIEW COMPARISON:  Radiographs of September 21, 2016. FINDINGS: Vascular calcifications are noted. Lytic destruction is seen involving the proximal base of the first metatarsal concerning for osteomyelitis. There also appears to be lytic destruction involving the superior and posterior aspect of the calcaneus. No definite fracture is noted. Joint spaces are intact. IMPRESSION: Lytic destruction is seen involving the proximal base of the first metatarsal as well as the superior and posterior portion of the calcaneus concerning for osteomyelitis. MRI may be performed further evaluation. Electronically Signed   By: Marijo Conception, M.D.   On: 12/01/2016 13:08    Caroline More, DO 12/02/2016, 12:28 PM PGY-1, [place] Intern pager: [phone], text pages welcome

## 2016-12-02 NOTE — ED Notes (Signed)
Pt aware NPO

## 2016-12-02 NOTE — ED Notes (Addendum)
Heparin drip increased to 1800 unit/hr. Pt. sleeping with no distress, respirations unlabored , IV site unremarkable .

## 2016-12-02 NOTE — ED Notes (Signed)
Pt and family members aware pt has assigned bed - 2W03. Should be transported to MRI in approx 15 min.

## 2016-12-02 NOTE — ED Notes (Signed)
Pt being transported to MRI then to 2W03 via bed.

## 2016-12-02 NOTE — ED Notes (Signed)
Dawn, WOC, aware of need for wound care assessment of right great toe - pt's sister advised he is supposed to have ointment applied to open/scabbed area.

## 2016-12-02 NOTE — Discharge Summary (Signed)
[facility] Discharge Summary  [name] Medical record number: [id number] Date of birth: 03-16-1950 Age: 66 y.o. Gender: male Date of Admission: 12/01/2016  Date of Discharge: 12/03/2016 Admitting Physician: Leonard Reasons, MD  Primary Care Provider: [name], MD Consultants: orthopedics   Indication for Hospitalization: left [name]/ankle swelling   Discharge Diagnoses/Problem List:  LLE swelling/left [name] gout Hx of DVT T2DM: poorly controlled Right toe wound Anemia HTN HLD CHF CAD s/p CABG CKD stage 4 COPD Hypothyroidism Tobacco abuse   Disposition: home, already has home health in place   Discharge Condition: stable, improving   Discharge Exam: General: awake and alert, laying in bed, NAD Cardiovascular: RRR, no MRG Respiratory: CTAB, no wheezes, rales, or rhonchi  Abdomen: soft, non tender, non distended, bowel sounds normal  Extremities: edema of left [name] and ankle, tenderness to palpation on calcaneus, right toe wound with betadine, unable to palpate distal pulses in lower extremities   Brief Hospital Course:  Leonard [name] Leonard [name] is a 66 y.o. male presenting with left ankle and [name] swelling x 19mo [facility]. Venous doppler studies in ED were negative, however xray revealed lytic destruction involving proximal base of the first metatarsal as well as superior and posterior portion of calcaneus concerning for osteomyelitis. ESR and CRP elevated to 130 and 7.3 respectively. MRI of [name] showed stable/slightly progressive findings compared to previous MRI in August 2018 most likely combination of gout and diabetic neuropathy. Dr. DSharol [name] orthopedics, was consulted and diagnosed patient with gout and recommended allopurinol and colchicine along with fracture boot to immobilize ankle and will need outpatient follow up.   On admission patient was found to be  anemic with Hgb of 7.1. 1unit PRBCs were transfused on 10/31 in hopes of having Hgb of 8 [name] patients history of CAD.   Issues for Follow Up:  1. Continue colchicine and allopurinol  2. Monitor Hgb, should remain >8. Check on day of hospital follow up  3. Monitor wound on right toe -  Wound care recommendations of painting the area with betadine swab daily and leaving it open to air  4. Follow up with Dr. DSharol Givenas outpatient  5. Check INR 6. Patient was put on lovenox once a day to bridge with coumadin until therapeutic INR   Significant Procedures:   Significant Labs and Imaging:   Recent Labs Lab 12/01/16 1400 12/02/16 0455 12/03/16 0326  WBC 8.0 6.2 6.2  HGB 7.1* 7.1* 7.6*  HCT 23.6* 23.6* 26.2*  PLT 291 278 287    Recent Labs Lab 12/01/16 1400  NA 136  K 4.1  CL 102  CO2 23  GLUCOSE 144*  BUN 36*  CREATININE 2.52*  CALCIUM 8.4*  ALKPHOS 89  AST 10*  ALT 7*  ALBUMIN 2.5*     Ref. Range 12/02/2016 15:57  Uric Acid, Serum Latest Ref Range: 4.4 - 7.6 mg/dL 7.7 (H)    Ref. Range 12/02/2016 15:57  Uric Acid, Serum Latest Ref Range: 4.4 - 7.6 mg/dL 7.7 (H)    Ref. Range 12/01/2016 14:36  Lactic Acid, Venous Latest Ref Range: 0.5 - 1.9 mmol/L 1.08    Ref. Range 12/01/2016 14:36  Lactic Acid, Venous Latest Ref Range: 0.5 - 1.9 mmol/L 1.08   Dg Ankle Complete Left  Result Date: 12/01/2016 CLINICAL DATA:  66year old diabetic presenting with swelling and discoloration to the left lower leg with pain in  the left [name] and ankle. Personal history of DVT. Current history of gout. EXAM: LEFT ANKLE COMPLETE - 3+ VIEW COMPARISON:  None. FINDINGS: Diffuse soft tissue swelling/edema. No evidence of acute or subacute fracture or dislocation. Ankle mortise intact with well-preserved joint space. Modeling of the talus with widening of the subtalar joint and possible lucencies within the medial talus and the talar dome. Well-preserved bone mineral density. Arterial calcification  involving the tibioperoneal arteries, the plantar arteries and the dorsalis pedis artery. IMPRESSION: 1. 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 2. No acute osseous abnormalities otherwise. Electronically Signed   By: Leonard [name] M.D.   On: 12/01/2016 13:08   Leonard [name] Left Wo Contrast  Result Date: 12/02/2016 CLINICAL DATA:  Diabetic neuropathy with increasing left ankle [name] pain and swelling. Patient also history of gout. EXAM: MRI OF THE LEFT [name] WITHOUT CONTRAST TECHNIQUE: Multiplanar, multisequence Leonard imaging of the left [name] was performed. No intravenous contrast was administered. COMPARISON:  MRI 09/21/2016 FINDINGS: Examination is somewhat limited by patient motion and lack of IV contrast. When compared the prior study from August most of the changes are relatively similar. The tibiotalar and subtalar joint effusions are slightly larger and there may be more fluid and worsening tendinopathy involving the peroneal tendons. Diffuse marrow signal abnormality involving the ankle, hindfoot and midfoot appears relatively stable. There is stable diffuse myositis but I do not see any severe cellulitis or focal soft tissue abscess. I think findings are most likely due to gout involving the ankle and subtalar joints and diabetic neuropathy involving the midfoot. Certainly could not totally exclude the possibility of infection but recommend clinical exclusion. IMPRESSION: Relatively stable to slightly progressive findings from the MRI of August 2018. I think the findings are most likely due to a combination of gout involving the tibiotalar joint and subtalar joints and diabetic neuropathy involving the midfoot. Osteomyelitis cannot be totally excluded based on the imaging findings but I think it is less likely. This could be excluded clinically. Electronically Signed   By: Marijo Sanes M.D.   On: 12/02/2016 15:37    Dg [name] Complete Left  Result Date: 12/01/2016 CLINICAL DATA:  Left leg swelling and discoloration. EXAM: LEFT [name] - COMPLETE 3+ VIEW COMPARISON:  Radiographs of September 21, 2016. FINDINGS: Vascular calcifications are noted. Lytic destruction is seen involving the proximal base of the first metatarsal concerning for osteomyelitis. There also appears to be lytic destruction involving the superior and posterior aspect of the calcaneus. No definite fracture is noted. Joint spaces are intact. IMPRESSION: Lytic destruction is seen involving the proximal base of the first metatarsal as well as the superior and posterior portion of the calcaneus concerning for osteomyelitis. MRI may be performed further evaluation. Electronically Signed   By: Marijo Conception, M.D.   On: 12/01/2016 13:08     Results/Tests Pending at Time of Discharge:  [facility]     Ordered   12/03/16 0500  Protime-INR  Daily,   R     12/02/16 1652   12/02/16 0500  CBC  Daily,   R     12/01/16 2157       Discharge Medications:  Allergies as of 12/03/2016   No Known Allergies     Medication List    TAKE these medications   allopurinol 100 MG tablet Commonly known as:  ZYLOPRIM Take 1 tablet (100 mg total) by mouth 2 (  two) times daily. What changed:  when to take this   amLODipine 10 MG tablet Commonly known as:  NORVASC Take 1 tablet (10 mg total) by mouth daily.   aspirin 81 MG chewable tablet Chew 81 mg by mouth daily.   atorvastatin 40 MG tablet Commonly known as:  LIPITOR TAKE ONE (1) TABLET BY MOUTH EVERY DAY AT 6:00PM What changed:  See the new instructions.   carvedilol 25 MG tablet Commonly known as:  COREG Take 2 tablets (50 mg total) by mouth 2 (two) times daily with a meal.   cloNIDine 0.2 MG tablet Commonly known as:  CATAPRES TAKE TWO (2) TABLETS BY MOUTH 2 TIMES DAILY What changed:  See the new instructions.   colchicine 0.6 MG tablet Take 1 tablet (0.6 mg total) by mouth 2  (two) times daily.   Darbepoetin Alfa 500 MCG/ML Sosy injection Commonly known as:  ARANESP Inject 500 mcg into the skin every 14 (fourteen) days.   doxazosin 8 MG tablet Commonly known as:  CARDURA Take 1 tablet (8 mg total) by mouth every evening.   enoxaparin 100 MG/ML injection Commonly known as:  LOVENOX Inject 0.9 mLs (90 mg total) into the skin daily. Please inject ONLY 0.9 mLs (7m total) on 12/04/16. Do NOT use whole syringe   ferrous sulfate 325 (65 FE) MG tablet TAKE ONE (1) TABLET BY MOUTH TWO (2) TIMES DAILY WITH A MEAL What changed:  See the new instructions.   ferumoxytol 510 mg in sodium chloride 0.9 % 100 mL Inject into the vein every 7 (seven) days.   glucose blood test strip Commonly known as:  ONETOUCH VERIO Use as instructed to test three times daily. ICD-10 code: E11.22   hydrALAZINE 100 MG tablet Commonly known as:  APRESOLINE TAKE ONE TABLET BY MOUTH EVERY EIGHT HOURS What changed:  See the new instructions.   Insulin Glargine 100 UNIT/ML Solostar Pen Commonly known as:  LANTUS SOLOSTAR Inject 50 Units into the skin every morning. What changed:  when to take this   isosorbide mononitrate 60 MG 24 hr tablet Commonly known as:  IMDUR Take 60 mg by mouth daily.   levothyroxine 50 MCG tablet Commonly known as:  SYNTHROID, LEVOTHROID Take 50 mcg by mouth daily before breakfast.   nitroGLYCERIN 0.4 MG SL tablet Commonly known as:  NITROSTAT Place 1 tablet (0.4 mg total) under the tongue every 5 (five) minutes as needed for chest pain.   NOVOLOG FLEXPEN 100 UNIT/ML FlexPen Generic drug:  insulin aspart Inject 15 Units into the skin every evening.   ONETOUCH DELICA LANCETS 317BMisc 1 each by Does not apply route 3 (three) times daily. ICD-10 code: E11.22   [facility] FLEX [facility] w/Device Kit 1 kit by Does not apply route daily.   pantoprazole 40 MG tablet Commonly known as:  PROTONIX TAKE ONE (1) TABLET BY MOUTH EVERY DAY What changed:   See the new instructions.   potassium chloride SA 20 MEQ tablet Commonly known as:  K-DUR,KLOR-CON Take 20 mEq by mouth daily.   torsemide 20 MG tablet Commonly known as:  DEMADEX Take 3 tablets (60 mg total) by mouth daily. May take an extra 20 mg Daily for weight gain above 212 lbs What changed:  how much to take  when to take this  additional instructions   traMADol 50 MG tablet Commonly known as:  ULTRAM Take 1 tablet (50 mg total) by mouth every 8 (eight) hours as needed. What changed:  [name] to  take this   warfarin 5 MG tablet Commonly known as:  COUMADIN Take 2.5-5 mg by mouth See admin instructions. 2.5 mg in the evening on Sun/Mon/Tues/Wed/Thurs/Sat and 5 mg on Fri       Discharge Instructions: Please refer to Patient Instructions section of EMR for full details.  Patient was counseled important signs and symptoms that should prompt return to medical care, changes in medications, dietary instructions, activity restrictions, and follow up appointments.   Follow-Up Appointments: Follow-up Information    Newt Minion, MD Follow up in 1 week(s).   Specialty:  Orthopedic Surgery Contact information: [place] [address] [phone]        Nicolette Bang, DO. Go on 12/05/2016.   Specialty:  Family Medicine Why:  @ 3:30pm (please arrive atleast 8mn early) Contact information: [phone]           DMarjie Skiff MD 12/03/2016, 5:34 PM PGY-1, [place]

## 2016-12-02 NOTE — ED Notes (Signed)
Dr Sharol Given in w/pt. Advising pt has gout in left foot.

## 2016-12-02 NOTE — Progress Notes (Addendum)
ANTICOAGULATION CONSULT NOTE  Pharmacy Consult for heparin Indication: history of DVT- anticoag on warfarin PTA  No Known Allergies  Patient Measurements: Height: 5\' 8"  (172.7 cm) Weight: 197 lb (89.4 kg) IBW/kg (Calculated) : 68.4 Heparin Dosing Weight: 86.7  Vital Signs: Temp: 98.2 F (36.8 C) (10/29 2049) Temp Source: Oral (10/29 2049) BP: 163/60 (10/30 0459) Pulse Rate: 73 (10/30 0459)  Labs:  Recent Labs  12/01/16 1400 12/02/16 0455 12/02/16 0500  HGB 7.1* 7.1*  --   HCT 23.6* 23.6*  --   PLT 291 278  --   LABPROT 20.0*  --   --   INR 1.72  --   --   HEPARINUNFRC  --   --  <0.10*  CREATININE 2.52*  --   --     Estimated Creatinine Clearance: 31.7 mL/min (A) (by C-G formula based on SCr of 2.52 mg/dL (H)).   Medical History: Past Medical History:  Diagnosis Date  . Cancer ([place])    skin cancer-  . CHF (congestive heart failure) ([place])   . Chronic kidney disease    not on dialysis  . Coronary artery disease   . Gangrene of toe ([place])    right 5th/notes 01/04/2013   . High cholesterol   . Hypertension   . Iron deficiency anemia 08/03/2015  . 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 PAD (peripheral artery disease) ([place])   . Shortness of breath dyspnea    with exertion  . Type II diabetes mellitus ([facility])    Type II    Medications:  Scheduled:  . allopurinol  100 mg Oral Daily  . atorvastatin  40 mg Oral q1800  . carvedilol  25 mg Oral BID WC  . cloNIDine  0.2 mg Oral BID  . insulin aspart  0-15 Units Subcutaneous TID WC  . insulin aspart  0-5 Units Subcutaneous QHS  . levothyroxine  50 mcg Oral QAC breakfast  . pantoprazole  40 mg Oral Daily  . torsemide  60 mg Oral Daily    Assessment: 66 YOM admitted with swelling of LLE. Pharmacy consulted for heparin dosing for history of DVT - on warfarin PTA. Last dose PTA 10/28. Hgb 7.1 stable, Plt wnl, Scr 2.52 (at baseline). DVT doppler negative. INR 1.72. Warfarin being held for  ortho consult. Initial heparin level undetectable. No bleed or IV line issues per RN.  Goal of Therapy:   Heparin level 0.3-0.7 units/ml Monitor platelets by anticoagulation protocol: Yes   Plan:  Increase heparin infusion to 1800 units/hr  6 hour heparin level, daily heparin level and CBC Monitor s/x of bleeding Warfarin on hold  Elicia Lamp, PharmD, BCPS Clinical Pharmacist 12/02/2016 6:28 AM

## 2016-12-02 NOTE — Progress Notes (Signed)
Patient currently being seen in the ED and follow up will be made with PCP after this. Shenee Wignall,CMA

## 2016-12-02 NOTE — ED Notes (Signed)
Attempted to call report - advised by secretary RN will call back.

## 2016-12-02 NOTE — ED Notes (Signed)
Pt CBG was 137, notified Rebecca(RN)

## 2016-12-03 ENCOUNTER — Other Ambulatory Visit: Payer: Medicaid Other

## 2016-12-03 ENCOUNTER — Other Ambulatory Visit: Payer: Self-pay | Admitting: Hematology and Oncology

## 2016-12-03 DIAGNOSIS — J449 Chronic obstructive pulmonary disease, unspecified: Secondary | ICD-10-CM

## 2016-12-03 DIAGNOSIS — E1122 Type 2 diabetes mellitus with diabetic chronic kidney disease: Secondary | ICD-10-CM

## 2016-12-03 DIAGNOSIS — N183 Chronic kidney disease, stage 3 (moderate): Secondary | ICD-10-CM

## 2016-12-03 DIAGNOSIS — M86172 Other acute osteomyelitis, left ankle and foot: Principal | ICD-10-CM

## 2016-12-03 DIAGNOSIS — D631 Anemia in chronic kidney disease: Secondary | ICD-10-CM

## 2016-12-03 DIAGNOSIS — Z794 Long term (current) use of insulin: Secondary | ICD-10-CM

## 2016-12-03 DIAGNOSIS — I2510 Atherosclerotic heart disease of native coronary artery without angina pectoris: Secondary | ICD-10-CM

## 2016-12-03 DIAGNOSIS — N184 Chronic kidney disease, stage 4 (severe): Principal | ICD-10-CM

## 2016-12-03 LAB — CBC
HEMATOCRIT: 26.2 % — AB (ref 39.0–52.0)
HEMOGLOBIN: 7.6 g/dL — AB (ref 13.0–17.0)
MCH: 21.2 pg — AB (ref 26.0–34.0)
MCHC: 29 g/dL — ABNORMAL LOW (ref 30.0–36.0)
MCV: 73 fL — AB (ref 78.0–100.0)
Platelets: 287 10*3/uL (ref 150–400)
RBC: 3.59 MIL/uL — AB (ref 4.22–5.81)
RDW: 20.1 % — ABNORMAL HIGH (ref 11.5–15.5)
WBC: 6.2 10*3/uL (ref 4.0–10.5)

## 2016-12-03 LAB — GLUCOSE, CAPILLARY
GLUCOSE-CAPILLARY: 213 mg/dL — AB (ref 65–99)
Glucose-Capillary: 182 mg/dL — ABNORMAL HIGH (ref 65–99)
Glucose-Capillary: 173 mg/dL — ABNORMAL HIGH (ref 65–99)
Glucose-Capillary: 140 mg/dL — ABNORMAL HIGH (ref 65–99)

## 2016-12-03 LAB — PREPARE RBC (CROSSMATCH)

## 2016-12-03 LAB — PROTIME-INR
INR: 1.72
Prothrombin Time: 20 seconds — ABNORMAL HIGH (ref 11.4–15.2)

## 2016-12-03 LAB — HEPARIN LEVEL (UNFRACTIONATED): Heparin Unfractionated: 0.51 IU/mL (ref 0.30–0.70)

## 2016-12-03 MED ORDER — ALLOPURINOL 100 MG PO TABS: 100.0000 mg | ORAL_TABLET | Freq: Two times a day (BID) | ORAL | 0 refills | Status: DC

## 2016-12-03 MED ORDER — ENOXAPARIN SODIUM 100 MG/ML ~~LOC~~ SOLN: 1.0000 mg/kg | SUBCUTANEOUS | Status: DC

## 2016-12-03 MED ORDER — SODIUM CHLORIDE 0.9 % IV SOLN
Freq: Once | INTRAVENOUS | Status: AC
  Administered 2016-12-03: 09:00:00 via INTRAVENOUS

## 2016-12-03 MED ORDER — WARFARIN SODIUM 2.5 MG PO TABS
5.0000 mg | ORAL_TABLET | ORAL | Status: AC
  Administered 2016-12-03: 5 mg via ORAL
  Filled 2016-12-03: qty 2

## 2016-12-03 MED ORDER — ENOXAPARIN SODIUM 100 MG/ML ~~LOC~~ SOLN
90.0000 mg | SUBCUTANEOUS | Status: DC
  Administered 2016-12-03: 90 mg via SUBCUTANEOUS
  Filled 2016-12-03: qty 1

## 2016-12-03 MED ORDER — COLCHICINE 0.6 MG PO TABS: 0.6000 mg | ORAL_TABLET | Freq: Two times a day (BID) | ORAL | 0 refills | Status: DC

## 2016-12-03 MED ORDER — ENOXAPARIN SODIUM 100 MG/ML ~~LOC~~ SOLN: 90.0000 mg | SUBCUTANEOUS | 0 refills | Status: DC

## 2016-12-03 NOTE — Discharge Instructions (Signed)
You were admitted for left foot and ankle swelling. MRI and consultation with Dr. Sharol Given noted you to have gout. You were put on colchicine and allopurinol. Your right toe wound was treated with betadine by wound care.  Please continue wound care with your home health nurse  Please follow up with Dr. Juleen China on 12/05/16 @ 3:30pm (please arrive at least 15 min early)   If you develop fever, chills, increased pain/swelling, dizziness or lightheadedness, pallor, high heart rate, or palpitations please call your PCP or come to the Emergency room.   ==========================================================================================  Information on my medicine - Coumadin   (Warfarin)  This medication education was reviewed with me or my healthcare representative as part of my discharge preparation.  The pharmacist that spoke with me during my hospital stay was:  Arty Baumgartner, [facility]  Why was Coumadin prescribed for you? Coumadin was prescribed for you because you have a blood clot or a medical condition that can cause an increased risk of forming blood clots. Blood clots can cause serious health problems by blocking the flow of blood to the heart, lung, or brain. Coumadin can prevent harmful blood clots from forming. As a reminder your indication for Coumadin is:   Deep Vein Thrombosis Treatment  What test will check on my response to Coumadin? While on Coumadin (warfarin) you will need to have an INR test regularly to ensure that your dose is keeping you in the desired range. The INR (international normalized ratio) number is calculated from the result of the laboratory test called prothrombin time (PT).  If an INR APPOINTMENT HAS NOT ALREADY BEEN MADE FOR YOU please schedule an appointment to have this lab work done by your health care provider within 7 days. Your INR goal is usually a number between:  2 to 3 or your provider may give you a more narrow range like 2-2.5.  Ask your health  care provider during an office visit what your goal INR is.  What  do you need to  know  About  COUMADIN? Take Coumadin (warfarin) exactly as prescribed by your healthcare provider about the same time each day.  DO NOT stop taking without talking to the doctor who prescribed the medication.  Stopping without other blood clot prevention medication to take the place of Coumadin may increase your risk of developing a new clot or stroke.  Get refills before you run out.  What do you do if you miss a dose? If you miss a dose, take it as soon as you remember on the same day then continue your regularly scheduled regimen the next day.  Do not take two doses of Coumadin at the same time.  Important Safety Information A possible side effect of Coumadin (Warfarin) is an increased risk of bleeding. You should call your healthcare provider right away if you experience any of the following: ? Bleeding from an injury or your nose that does not stop. ? Unusual colored urine (red or dark brown) or unusual colored stools (red or black). ? Unusual bruising for unknown reasons. ? A serious fall or if you hit your head (even if there is no bleeding).  Some foods or medicines interact with Coumadin (warfarin) and might alter your response to warfarin. To help avoid this: ? Eat a balanced diet, maintaining a consistent amount of Vitamin K. ? Notify your provider about major diet changes you plan to make. ? Avoid alcohol or limit your intake to 1 drink for women and 2  drinks for men per day. (1 drink is 5 oz. wine, 12 oz. beer, or 1.5 oz. liquor.)  Make sure that ANY health care provider who prescribes medication for you knows that you are taking Coumadin (warfarin).  Also make sure the healthcare provider who is monitoring your Coumadin knows when you have started a new medication including herbals and non-prescription products.  Coumadin (Warfarin)  Major Drug Interactions  Increased Warfarin Effect Decreased  Warfarin Effect  Alcohol (large quantities) Antibiotics (esp. Septra/Bactrim, Flagyl, Cipro) Amiodarone (Cordarone) Aspirin (ASA) Cimetidine (Tagamet) Megestrol (Megace) NSAIDs (ibuprofen, naproxen, etc.) Piroxicam (Feldene) Propafenone (Rythmol SR) Propranolol (Inderal) Isoniazid (INH) Posaconazole (Noxafil) Barbiturates (Phenobarbital) Carbamazepine (Tegretol) Chlordiazepoxide (Librium) Cholestyramine (Questran) Griseofulvin Oral Contraceptives Rifampin Sucralfate (Carafate) Vitamin K   Coumadin (Warfarin) Major Herbal Interactions  Increased Warfarin Effect Decreased Warfarin Effect  Garlic Ginseng Ginkgo biloba Coenzyme Q10 Green tea St. Johns wort    Coumadin (Warfarin) FOOD Interactions  Eat a consistent number of servings per week of foods HIGH in Vitamin K (1 serving =  cup)  Collards (cooked, or boiled & drained) Kale (cooked, or boiled & drained) Mustard greens (cooked, or boiled & drained) Parsley *serving size only =  cup Spinach (cooked, or boiled & drained) Swiss chard (cooked, or boiled & drained) Turnip greens (cooked, or boiled & drained)  Eat a consistent number of servings per week of foods MEDIUM-HIGH in Vitamin K (1 serving = 1 cup)  Asparagus (cooked, or boiled & drained) Broccoli (cooked, boiled & drained, or raw & chopped) Brussel sprouts (cooked, or boiled & drained) *serving size only =  cup Lettuce, raw (green leaf, endive, romaine) Spinach, raw Turnip greens, raw & chopped   These websites have more information on Coumadin (warfarin):  FailFactory.se; VeganReport.com.au;

## 2016-12-03 NOTE — Progress Notes (Signed)
Patient was educated on how and why to give the lovenox injection. The patient stated that they are comfortable with the process because they are a diabetic. Completed verbal teach back method.

## 2016-12-03 NOTE — Progress Notes (Signed)
Spoke to patient and stated that he is not able to go home tonight due to her sister is unable to pick him up. Will update MD.

## 2016-12-03 NOTE — Progress Notes (Signed)
ANTICOAGULATION CONSULT NOTE  Pharmacy Consult for heparin Indication: history of DVT  No Known Allergies  Patient Measurements: Height: 5\' 8"  (172.7 cm) Weight: 189 lb 1.6 oz (85.8 kg) IBW/kg (Calculated) : 68.4 Heparin Dosing Weight: 86.7  Vital Signs: Temp: 98.1 F (36.7 C) (10/30 2123) Temp Source: Oral (10/30 2123) BP: 157/70 (10/30 2123) Pulse Rate: 67 (10/30 2123)  Labs:  Recent Labs  12/01/16 1400 12/02/16 0455 12/02/16 0500 12/02/16 1331 12/02/16 2235  HGB 7.1* 7.1*  --   --   --   HCT 23.6* 23.6*  --   --   --   PLT 291 278  --   --   --   LABPROT 20.0*  --   --   --   --   INR 1.72  --   --   --   --   HEPARINUNFRC  --   --  <0.10* 0.23* 0.43  CREATININE 2.52*  --   --   --   --     Estimated Creatinine Clearance: 31.2 mL/min (A) (by C-G formula based on SCr of 2.52 mg/dL (H)).  Assessment: 66 y.o. male with h/o DVT, subtherapeutic INR,  for heparin   Goal of Therapy:   Heparin level 0.3-0.7 INR 2-3 Monitor platelets by anticoagulation protocol: Yes   Plan:  Continue Heparin at current rate  Phillis Knack, PharmD, BCPS  12/03/2016 1:42 AM

## 2016-12-03 NOTE — Progress Notes (Signed)
Family Medicine Teaching Service Daily Progress Note Intern Pager: [phone]  [name] Medical record number: [id number] Date of birth: [birth year] Age: 66 y.o. Gender: male  Primary Care Provider: [name], MD Consultants: orthopedics  Code Status: full   Pt Overview and Major Events to Date:  Admitted to [facility] on 10/29   Assessment and Plan: Leonard Koegel Poseyis a 66 y.o.malepresenting with left ankle and foot swelling x 1 mo. PMH is significant for hyperlipidemia, HTN, CAD s/p CABG, tobacco abuse, CKD stage 4, uncontrolled T2DM, and COPD.   LLE swelling/left foot  Most likely 2/2 to gout given MRI findings and consultations with orthopedics, Dr. Sharol Given. Patient complains of left ankle and foot swelling and pain x 1 month in duration. Xray in ED showing lytic destruction of proximal base on first metatarsal and superior/posterior portion of calcaneus concerning for osteomyelits. ESR and CRP elevated to 130 and 7.3 respectively. MRI on 12/02/2016 showing relatively stable slightly progressive findings from MRI on 09/2016, with findings most likely due to combination of gout involving tibiotalar joint and subtalar joints and diabetic neuropathy involving midfoot. Uric acid elevated to 7.7. 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 Have recommended allopurinol 141m bid and colchicine 0.621mbid. Patient will need fracture boot to immobilize ankle and will need outpatient f/u. Blood cultures NGTD.  -Orthopedics consulted, appreciate recommendations  -f/u blood cultures   Hx of DVT Stable. 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 On warfarin at home. Lower extremity DVT Doppler negative on admission.PT/INR subtherapeutic on 10/29 at 1.72. -heprin gtt while inpatient in preparation for possible orthopedic surgery,  can transition back to warfarin in discharge   T2DM:Poorly controlled. Last A1c 11.0 on 9/70/2018 Onnovolog 15 units in pm, lantus 50 units in am  -monitor CBG -lantus 50 units  -monitor A1C -sliding scale insulin  Right toe wound Fresh-looking wound with bloody scab on his right toe. No signs of infection. Wound care recommendations of painting area with betadine swab daily and leaving area open to air.  -Wound care consulted, appreciate recommendations   Anemia Hgb today of 7.6, slightly improved from 7.1 on 10/30. Per chart review patient is chronically low, but given hx of CAD will likely need Hgb >8. Patient is asymptomatic  -will transfuse 1unit PRBCs given cardiac hx. Patient has verbalized consent after being informed of risks   HTN  Home medications: amlodipine 1081mcarvedilol 18m79mlonidine 0.4mg,24mxazosin 8 mg, Imdur&hydralazine 100 mg. Current BP of 140/59 -carvedilol 25mg 64me a day. On 50 mg twice a day at home -Continue home clonidine -Hold amlodipine, doxazosin, hydralazine and Imdur. Will introduce as BP allows  HLD Home medications: atorvastatin 40mg  67mtinue home medications   CHF Home medications: torsemide 60mg. E22mon 06/05/2015 showing LV EF of 60-65% with normal systolic function. Patient denies symptoms of dyspnea or orthopnea. Edema on exam was unilateral. Patient appeared euvolemic on admission.  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 Home medications: ASA 81mg, im43m60mg, nit48mycerin prn -May reintroduce Imdur if BP allows -Continue home nitroglycerin  CKD stage 4 Serum creatinine better than baseline at 2.52 on admission. Per chart review patient was to get AVF and is assigned to Dr. ColadonatoMarval Regalgy. However, does not appear to see nephrology. Patient is still making urine.  -  continue to monitor renal function with daily BMP -consider  consultation with nephrology while inpatient -will need to inquire further into outpatient nephrology workup   COPD not on any home medications. No complaints of dyspnea or cough. Lungs CTAB. -prn duonebs  -continue to monitor   Hypothyroidism Home medications: synthroid 50 mcg -continue home medications   Tobacco abuse  Patient reports former smoking history. Patient quit smoking 3 years ago when he had an MI.   FEN/GI: NPO for surgery  PPx: heparin gtt  Disposition: will be discharged to home   Subjective:  Patient today with no concerns. States foot feels the same as it did on admission, but states he wasn't in severe pain on admission. States intermittent pain. Patient reports no BM since Tuesday, but does not want to try any medications to soften stool. Denies fever, chills, nausea, vomiting, diarrhea, dizziness, chest pain, or syncope. States he hopes he can go home today.   Objective: Temp:  [97.7 F (36.5 C)-98.4 F (36.9 C)] 97.7 F (36.5 C) (10/31 0754) Pulse Rate:  [67-75] 75 (10/31 0754) Resp:  [16-18] 18 (10/31 0754) BP: (140-166)/(59-70) 164/68 (10/31 0754) SpO2:  [99 %-100 %] 100 % (10/31 0754) Weight:  [189 lb 1.6 oz (85.8 kg)] 189 lb 1.6 oz (85.8 kg) (10/30 2123) Physical Exam: General: awake and alert, laying in bed, NAD Cardiovascular: RRR, no MRG Respiratory: CTAB, no wheezes, rales, or rhonchi  Abdomen: soft, non tender, non distended, bowel sounds normal  Extremities: edema of left foot and ankle, tenderness to palpation on calcaneus, right toe wound with betadine, unable to palpate distal pulses in lower extremities   Laboratory:  Recent Labs Lab 12/01/16 1400 12/02/16 0455 12/03/16 0326  WBC 8.0 6.2 6.2  HGB 7.1* 7.1* 7.6*  HCT 23.6* 23.6* 26.2*  PLT 291 278 287    Recent Labs Lab 12/01/16 1400  NA 136  K 4.1  CL 102  CO2 23  BUN 36*  CREATININE 2.52*  CALCIUM 8.4*  PROT 6.8  BILITOT 0.3  ALKPHOS 89  ALT 7*  AST 10*   GLUCOSE 144*     Ref. Range 12/02/2016 15:57  Uric Acid, Serum Latest Ref Range: 4.4 - 7.6 mg/dL 7.7 (H)    Imaging/Diagnostic Tests: Dg Ankle Complete Left  Result Date: 12/01/2016 CLINICAL DATA:  66 year old diabetic presenting with swelling and discoloration to the left lower leg with pain in the left foot and ankle. Personal history of DVT. Current history of gout. EXAM: LEFT ANKLE COMPLETE - 3+ VIEW COMPARISON:  None. FINDINGS: Diffuse soft tissue swelling/edema. No evidence of acute or subacute fracture or dislocation. Ankle mortise intact with well-preserved joint space. Modeling of the talus with widening of the subtalar joint and possible lucencies within the medial talus and the talar dome. Well-preserved bone mineral density. Arterial calcification involving the tibioperoneal arteries, the plantar arteries and the dorsalis pedis artery. IMPRESSION: 1. 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 2. No acute osseous abnormalities otherwise. Electronically Signed   By: Evangeline Dakin M.D.   On: 12/01/2016 13:08   Mr Foot Left Wo Contrast  Result Date: 12/02/2016 CLINICAL DATA:  Diabetic neuropathy with increasing left ankle foot pain and swelling. Patient also history of gout. EXAM: MRI OF THE LEFT FOOT WITHOUT CONTRAST TECHNIQUE: Multiplanar, multisequence MR imaging of the left foot was performed. No intravenous contrast was administered. COMPARISON:  MRI 09/21/2016 FINDINGS: Examination is somewhat limited by patient motion and  lack of IV contrast. When compared the prior study from August most of the changes are relatively similar. The tibiotalar and subtalar joint effusions are slightly larger and there may be more fluid and worsening tendinopathy involving the peroneal tendons. Diffuse marrow signal abnormality involving the ankle, hindfoot and midfoot appears relatively stable. There is stable  diffuse myositis but I do not see any severe cellulitis or focal soft tissue abscess. I think findings are most likely due to gout involving the ankle and subtalar joints and diabetic neuropathy involving the midfoot. Certainly could not totally exclude the possibility of infection but recommend clinical exclusion. IMPRESSION: Relatively stable to slightly progressive findings from the MRI of August 2018. I think the findings are most likely due to a combination of gout involving the tibiotalar joint and subtalar joints and diabetic neuropathy involving the midfoot. Osteomyelitis cannot be totally excluded based on the imaging findings but I think it is less likely. This could be excluded clinically. Electronically Signed   By: Marijo Sanes M.D.   On: 12/02/2016 15:37   Dg Foot Complete Left  Result Date: 12/01/2016 CLINICAL DATA:  Left leg swelling and discoloration. EXAM: LEFT FOOT - COMPLETE 3+ VIEW COMPARISON:  Radiographs of September 21, 2016. FINDINGS: Vascular calcifications are noted. Lytic destruction is seen involving the proximal base of the first metatarsal concerning for osteomyelitis. There also appears to be lytic destruction involving the superior and posterior aspect of the calcaneus. No definite fracture is noted. Joint spaces are intact. IMPRESSION: Lytic destruction is seen involving the proximal base of the first metatarsal as well as the superior and posterior portion of the calcaneus concerning for osteomyelitis. MRI may be performed further evaluation. Electronically Signed   By: Marijo Conception, M.D.   On: 12/01/2016 13:08    Caroline More, DO 12/03/2016, 8:34 AM PGY-1, [facility] Intern pager: [phone], text pages welcome

## 2016-12-04 LAB — BPAM RBC
Blood Product Expiration Date: 201811202359
ISSUE DATE / TIME: 201810311422
UNIT TYPE AND RH: 1700

## 2016-12-04 LAB — GLUCOSE, CAPILLARY
Glucose-Capillary: 116 mg/dL — ABNORMAL HIGH (ref 65–99)
Glucose-Capillary: 117 mg/dL — ABNORMAL HIGH (ref 65–99)

## 2016-12-04 LAB — TYPE AND SCREEN
ABO/RH(D): B NEG
Antibody Screen: NEGATIVE
Unit division: 0

## 2016-12-04 NOTE — Progress Notes (Signed)
Discharge instructions, medications/prescriptions, and follow up appointments discussed in length with pt and niece at bedside, verbalized understanding. IV dc'ed, site clean and dry. Pt was escorted out of the unit in wheelchair, took all belongings with him.

## 2016-12-05 ENCOUNTER — Encounter: Payer: Self-pay | Admitting: Internal Medicine

## 2016-12-05 ENCOUNTER — Other Ambulatory Visit (HOSPITAL_COMMUNITY): Payer: Self-pay

## 2016-12-05 ENCOUNTER — Ambulatory Visit (INDEPENDENT_AMBULATORY_CARE_PROVIDER_SITE_OTHER): Payer: Medicare Other | Admitting: Internal Medicine

## 2016-12-05 VITALS — BP 148/60 | HR 75 | Temp 98.1°F | Ht 68.0 in | Wt 199.4 lb

## 2016-12-05 DIAGNOSIS — Z7901 Long term (current) use of anticoagulants: Secondary | ICD-10-CM

## 2016-12-05 DIAGNOSIS — D649 Anemia, unspecified: Secondary | ICD-10-CM | POA: Diagnosis not present

## 2016-12-05 NOTE — Progress Notes (Signed)
Subjective:    Leonard [name] - 66 y.o. male MRN [id number]  Date of birth: 1950/08/06  HPI  Leonard [name] is here for hospital follow up. Admitted 10/29 for left foot and ankle swelling, concern for possible DVT. LE dopplers were negative but x-ray showed lytic lesion concerning for osteomyelitis. MRI of foot showed stable/slightly progressive findings compared to previous MRI in August 2018 most likely combination of gout and diabetic neuropathy. Dr. Sharol Given, orthopedics, was consulted and diagnosed patient with gout and recommended allopurinol and colchicine along with fracture boot to immobilize ankle and will need outpatient follow up. Hospital course complicated by anemia with HgB 7.1. Was transfused 1u PRBC on 10/31 prior to discharge. Today, patient reports that he has been doing well since discharge. He has been able to obtain his colchicine and allopurinol for gout. Still having swelling and pain in the left ankle. He did not pick up Lovenox from drug store. Inpatient team had been planning to bridge Coumadin with Lovenox until reached therapeutic INR.    -  reports that he quit smoking about 3 years ago. His smoking use included Cigarettes. He started smoking about 49 years ago. He has a 46.00 pack-year smoking history. He has never used smokeless tobacco. - Review of Systems: Per HPI. - Past Medical History: Patient Active Problem List   Diagnosis Date Noted  . Acute osteomyelitis of left talus (HCC)   . Idiopathic chronic gout of left foot without tophus   . Osteomyelitis ([place]) 12/01/2016  . Acute osteomyelitis of left foot ([place])   . Left foot pain 10/29/2016  . Long term (current) use of anticoagulants [Z79.01] 09/09/2016  . Chronic deep vein thrombosis (DVT) of femoral vein of left lower extremity ([place]) 09/03/2016  . Morbid obesity due to excess calories ([place]) 03/17/2016  . Anemia due to stage 4 chronic kidney disease treated with darbepoetin ([place]) 02/28/2016  . Fatigue  associated with anemia 01/21/2016  . Diabetic retinopathy ([place]) 11/13/2015  . CHF (congestive heart failure) ([place]) 08/27/2015  . Iron deficiency anemia 08/03/2015  . Claudication ([place])   . Type 2 diabetes mellitus with stage 3 chronic kidney disease, with long-term current use of insulin ([place])   . Hematuria 06/27/2015  . Skin lesion of face 02/22/2015  . 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 Deficiency anemia 01/15/2015  . CKD (chronic kidney disease) stage 4, GFR 15-29 ml/min (HCC) 01/15/2015  . Hypothyroidism 11/22/2014  . COPD GOLD III  02/16/2014  . History of tobacco use 08/23/2013  . 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 CAD (coronary artery disease) 04/06/2013  . PVD - hx of Rt SFA PTA and s/p Rt 4-5th toe amp Dec 2014 04/05/2013  . Essential hypertension 11/25/2012  . Mixed hyperlipidemia 07/17/2008   - Medications: reviewed and updated   Objective:   Physical Exam BP (!) 148/60   Pulse 75   Temp 98.1 F (36.7 C) (Oral)   Ht 5\' 8"  (1.727 m)   Wt 199 lb 6.4 oz (90.4 kg)   SpO2 97%   BMI 30.32 kg/m  Gen: NAD, alert, cooperative with exam, well-appearing CV: RRR, good S1/S2, no murmur, no edema, capillary refill brisk  Resp: CTABL, no wheezes, non-labored Ext: edema of left foot and ankle without overlying erythema, tenderness to palpation on calcaneus, right toe wound with betadine  Assessment & Plan:   1. Anemia, unspecified type Chronic iron deficiency anemia. HgB 7.6 on day of discharge. Goal >8.0 due to  CAD.  - CBC  2. Long term current use of anticoagulant therapy INR slightly subtherapeutic on day of discharge at 1.72 (goal 2.0-3.0). Instructed patient to start Lovenox. Will check INR and discontinue Lovenox if therapeutic. Has Coumadin clinic appointment 11/6.  - [place], D.O. 12/05/2016, 3:41 PM PGY-3, [place] [facility]

## 2016-12-05 NOTE — Patient Instructions (Signed)
Your Lovenox is at [facility]. Please pick up this prescription and start using daily. We will call you with your lab results and give you further instructions.

## 2016-12-05 NOTE — Progress Notes (Signed)
Paramedicine Encounter    Patient ID: Leonard [name], male    DOB: 12/05/1950, 66 y.o.   MRN: [id number]    Patient Care Team: [name], MD as PCP - General (Family Medicine) [name], MD as Consulting Physician (Nephrology)  Patient Active Problem List   Diagnosis Date Noted  . Acute osteomyelitis of left talus (HCC)   . Idiopathic chronic gout of left foot without tophus   . Osteomyelitis ([place]) 12/01/2016  . Acute osteomyelitis of left foot ([place])   . Left foot pain 10/29/2016  . Long term (current) use of anticoagulants [Z79.01] 09/09/2016  . Chronic deep vein thrombosis (DVT) of femoral vein of left lower extremity ([place]) 09/03/2016  . Morbid obesity due to excess calories ([place]) 03/17/2016  . Anemia due to stage 4 chronic kidney disease treated with darbepoetin ([place]) 02/28/2016  . Fatigue associated with anemia 01/21/2016  . Diabetic retinopathy ([place]) 11/13/2015  . CHF (congestive heart failure) ([place]) 08/27/2015  . Iron deficiency anemia 08/03/2015  . Claudication ([place])   . Type 2 diabetes mellitus with stage 3 chronic kidney disease, with long-term current use of insulin ([place])   . Hematuria 06/27/2015  . Skin lesion of face 02/22/2015  . 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 Deficiency anemia 01/15/2015  . CKD (chronic kidney disease) stage 4, GFR 15-29 ml/min (HCC) 01/15/2015  . Hypothyroidism 11/22/2014  . COPD GOLD III  02/16/2014  . History of tobacco use 08/23/2013  . 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 CAD (coronary artery disease) 04/06/2013  . PVD - hx of Rt SFA PTA and s/p Rt 4-5th toe amp Dec 2014 04/05/2013  . Essential hypertension 11/25/2012  . Mixed hyperlipidemia 07/17/2008    Current Outpatient Prescriptions:  .  allopurinol (ZYLOPRIM) 100 MG tablet, Take 1 tablet (100 mg total) by mouth 2 (two) times daily., Disp: 60 tablet, Rfl: 0 .  aspirin 81 MG chewable tablet, Chew 81 mg by mouth daily., Disp: , Rfl:  .   atorvastatin (LIPITOR) 40 MG tablet, TAKE ONE (1) TABLET BY MOUTH EVERY DAY AT 6:00PM (Patient taking differently: Take 40 mg by mouth once a day at 6 PM), Disp: 90 tablet, Rfl: 3 .  carvedilol (COREG) 25 MG tablet, Take 2 tablets (50 mg total) by mouth 2 (two) times daily with a meal., Disp: 120 tablet, Rfl: 3 .  cloNIDine (CATAPRES) 0.2 MG tablet, TAKE TWO (2) TABLETS BY MOUTH 2 TIMES DAILY (Patient taking differently: Take 0.4 mg by mouth two times a day), Disp: 120 tablet, Rfl: 3 .  colchicine 0.6 MG tablet, Take 1 tablet (0.6 mg total) by mouth 2 (two) times daily., Disp: 30 tablet, Rfl: 0 .  doxazosin (CARDURA) 8 MG tablet, Take 1 tablet (8 mg total) by mouth every evening., Disp: , Rfl:  .  ferrous sulfate 325 (65 FE) MG tablet, TAKE ONE (1) TABLET BY MOUTH TWO (2) TIMES DAILY WITH A MEAL (Patient taking differently: Take 325 mg by mouth two times a day with a meal), Disp: 60 tablet, Rfl: 3 .  hydrALAZINE (APRESOLINE) 100 MG tablet, TAKE ONE TABLET BY MOUTH EVERY EIGHT HOURS (Patient taking differently: Take 100 mg by mouth every 8 hours), Disp: 90 tablet, Rfl: 1 .  isosorbide mononitrate (IMDUR) 60 MG 24 hr tablet, Take 60 mg by mouth daily., Disp: , Rfl:  .  levothyroxine (SYNTHROID, LEVOTHROID) 50 MCG tablet, Take 50 mcg by mouth daily before breakfast., Disp: , Rfl:  .  pantoprazole (PROTONIX) 40 MG tablet, TAKE ONE (1) TABLET BY MOUTH EVERY DAY (Patient taking differently: Take 40 mg by mouth once a day), Disp: 90 tablet, Rfl: 0 .  potassium chloride SA (K-DUR,KLOR-CON) 20 MEQ tablet, Take 20 mEq by mouth daily., Disp: , Rfl:  .  torsemide (DEMADEX) 20 MG tablet, Take 3 tablets (60 mg total) by mouth daily. May take an extra 20 mg Daily for weight gain above 212 lbs (Patient taking differently: Take 20 mg by mouth 3 (three) times daily. And may take an extra 20 mg daily for weight gain above 212 lbs), Disp: 90 tablet, Rfl: 6 .  warfarin (COUMADIN) 5 MG tablet, Take 2.5-5 mg by mouth See admin  instructions. 2.5 mg in the evening on Sun/Mon/Tues/Wed/Thurs/Sat and 5 mg on Fri, Disp: , Rfl:  .  amLODipine (NORVASC) 10 MG tablet, Take 1 tablet (10 mg total) by mouth daily., Disp: 30 tablet, Rfl: 6 .  Blood Glucose Monitoring Suppl (ONETOUCH VERIO FLEX SYSTEM) w/Device KIT, 1 kit by Does not apply route daily., Disp: 1 kit, Rfl: 3 .  Darbepoetin Alfa (ARANESP) 500 MCG/ML SOSY injection, Inject 500 mcg into the skin every 14 (fourteen) days. , Disp: , Rfl:  .  enoxaparin (LOVENOX) 100 MG/ML injection, Inject 0.9 mLs (90 mg total) into the skin daily. Please inject ONLY 0.9 mLs (25m total) on 12/04/16. Do NOT use whole syringe, Disp: 0.9 mL, Rfl: 0 .  ferumoxytol 510 mg in sodium chloride 0.9 % 100 mL, Inject into the vein every 7 (seven) days. , Disp: , Rfl:  .  glucose blood (ONETOUCH VERIO) test strip, Use as instructed to test three times daily. ICD-10 code: E11.22, Disp: 100 each, Rfl: 12 .  insulin aspart (NOVOLOG FLEXPEN) 100 UNIT/ML FlexPen, Inject 15 Units into the skin every evening. , Disp: , Rfl:  .  Insulin Glargine (LANTUS SOLOSTAR) 100 UNIT/ML Solostar Pen, Inject 50 Units into the skin every morning. (Patient taking differently: Inject 50 Units into the skin daily after breakfast. ), Disp: 15 mL, Rfl: 3 .  nitroGLYCERIN (NITROSTAT) 0.4 MG SL tablet, Place 1 tablet (0.4 mg total) under the tongue every 5 (five) minutes as needed for chest pain., Disp: 30 tablet, Rfl: 12 .  ONETOUCH DELICA LANCETS 321HMISC, 1 each by Does not apply route 3 (three) times daily. ICD-10 code: E11.22, Disp: 100 each, Rfl: 12 .  traMADol (ULTRAM) 50 MG tablet, Take 1 tablet (50 mg total) by mouth every 8 (eight) hours as needed. (Patient taking differently: Take 50 mg by mouth every 8 (eight) hours as needed (for pain). ), Disp: 30 tablet, Rfl: 0 No Known Allergies   Social History   Social History  . Marital status: Single    Spouse name: N/A  . Number of children: N/A  . Years of education: N/A    Occupational History  . Not on file.   Social History Main Topics  . Smoking status: Former Smoker    Packs/day: 1.00    Years: 46.00    Types: Cigarettes    Start date: 02/04/1967    Quit date: 02/03/2013  . Smokeless tobacco: Never Used     Comment: Doing well with quitting.  . Alcohol use No  . Drug use: No  . Sexual activity: Not Currently   Other Topics Concern  . 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 He has no wheezes.  Abdominal: He exhibits no distension. There is no tenderness. There is no guarding.  Musculoskeletal: He exhibits edema and tenderness.  Edema and pain noted to pts right foot/ hx of same  Skin: Skin is warm and dry. He is not diaphoretic.        Future Appointments Date Time Provider [place]  12/05/2016 3:30 PM Nicolette Bang, DO [facility]  12/09/2016 10:45 AM [facility]  12/15/2016 9:45 AM Leavy Cella, [facility] [place]  12/18/2016 1:00 PM [facility] LAB 6 [facility] None  12/18/2016 1:30 PM Heath Lark, MD [facility] None  12/18/2016 2:00 PM [facility] INJ NURSE [facility] None  12/19/2016 2:00 PM [facility] VASC 3 [facility]  12/19/2016 2:45 PM Nickel, Sharmon Leyden, NP [facility]  02/04/2017 10:15 AM Gardiner Barefoot, DPM [facility]    ATF pt CAO x4 sitting in the living room c/o right foot pain.  Pt was released from the hospital yesterday after a 2-3 day stay due to foot pain.  Pt stated that he has a 330 f/u appointment today; he's currently calling around for transportation for that appointment.  Pt denies sob, dizziness and pain anywhere else.  Pt was prescribed lovenox when he was discharged but didn't pick it up.  The prescription was for one pin (11/1-11/2).  Pt is still taking warfarin and his pill box was filled  according to the last lab work he had.  rx bottles verified and pill box refilled.   The pharmacy hadnt delivered the colchine and allunprinol therefore I went to pick it up for him prior to filling his pill box.    BP (!) 180/80   Pulse 73   Resp 16   Wt 195 lb (88.5 kg)   SpO2 99%   BMI 29.65 kg/m   CBG 221 (pt hasnt taken his insulin yet; he plans on taking it after his caregiver comes and cook breakfast) **rx called in: Vitamin d  Weight yesterday-196 Last visit weight-197    Kasean Denherder, EMT Paramedic 12/05/2016    ACTION: Home visit completed

## 2016-12-06 ENCOUNTER — Telehealth: Payer: Self-pay | Admitting: Internal Medicine

## 2016-12-06 LAB — CULTURE, BLOOD (ROUTINE X 2)
CULTURE: NO GROWTH
Culture: NO GROWTH
Special Requests: ADEQUATE
Special Requests: ADEQUATE

## 2016-12-06 NOTE — Telephone Encounter (Signed)
Attempted to call patient earlier but no answer. Please call him to let him know INR is at goal and he no longer needs Lovenox. He should continue Coumadin as prescribed. Follow up at coumadin clinic on 11/6.   Hemoglobin is low but stable after blood transfusion at 8.6 and appears consistent with his baseline.   Phill Myron, D.O. 12/06/2016, 8:58 PM PGY-3, [place]

## 2016-12-09 ENCOUNTER — Ambulatory Visit (INDEPENDENT_AMBULATORY_CARE_PROVIDER_SITE_OTHER): Payer: Medicare Other | Admitting: Pharmacist

## 2016-12-09 ENCOUNTER — Other Ambulatory Visit (HOSPITAL_COMMUNITY): Payer: Self-pay

## 2016-12-09 DIAGNOSIS — Z7901 Long term (current) use of anticoagulants: Secondary | ICD-10-CM | POA: Diagnosis not present

## 2016-12-09 DIAGNOSIS — I82512 Chronic embolism and thrombosis of left femoral vein: Secondary | ICD-10-CM | POA: Diagnosis not present

## 2016-12-09 LAB — CBC
HEMATOCRIT: 28 % — AB (ref 37.5–51.0)
HEMOGLOBIN: 8.6 g/dL — AB (ref 13.0–17.7)
MCH: 23.3 pg — ABNORMAL LOW (ref 26.6–33.0)
MCHC: 30.7 g/dL — AB (ref 31.5–35.7)
MCV: 76 fL — ABNORMAL LOW (ref 79–97)
Platelets: 292 10*3/uL (ref 150–379)
RBC: 3.69 x10E6/uL — AB (ref 4.14–5.80)
RDW: 20.1 % — ABNORMAL HIGH (ref 12.3–15.4)
WBC: 7.4 10*3/uL (ref 3.4–10.8)

## 2016-12-09 LAB — POCT INR: INR: 1.7

## 2016-12-09 LAB — PROTIME-INR
INR: 2.2 — AB (ref 0.8–1.2)
PROTHROMBIN TIME: 22.2 s — AB (ref 9.1–12.0)

## 2016-12-09 NOTE — Telephone Encounter (Signed)
Called pt. Pt not home as he has gone to a Dr. Hilaria Ota. I will try to call him again. Ottis Stain, CMA

## 2016-12-09 NOTE — Telephone Encounter (Signed)
Called pt. Pt unable to come to phone. I told the person who answered the phone that I would call back in a little while. Ottis Stain, CMA

## 2016-12-09 NOTE — Progress Notes (Signed)
Paramedicine Encounter    Patient ID: Leonard [name], male    DOB: 07-22-1950, 66 y.o.   MRN: [id number]    Patient Care Team: [name], MD as PCP - General (Family Medicine) [name], MD as Consulting Physician (Nephrology)  Patient Active Problem List   Diagnosis Date Noted  . Acute osteomyelitis of left talus (HCC)   . Idiopathic chronic gout of left foot without tophus   . Osteomyelitis ([place]) 12/01/2016  . Acute osteomyelitis of left foot ([place])   . Left foot pain 10/29/2016  . Long term (current) use of anticoagulants [Z79.01] 09/09/2016  . Chronic deep vein thrombosis (DVT) of femoral vein of left lower extremity ([place]) 09/03/2016  . Morbid obesity due to excess calories ([place]) 03/17/2016  . Anemia due to stage 4 chronic kidney disease treated with darbepoetin ([place]) 02/28/2016  . Fatigue associated with anemia 01/21/2016  . Diabetic retinopathy ([place]) 11/13/2015  . CHF (congestive heart failure) ([place]) 08/27/2015  . Iron deficiency anemia 08/03/2015  . Claudication ([place])   . Type 2 diabetes mellitus with stage 3 chronic kidney disease, with long-term current use of insulin ([place])   . Hematuria 06/27/2015  . Skin lesion of face 02/22/2015  . 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 Deficiency anemia 01/15/2015  . CKD (chronic kidney disease) stage 4, GFR 15-29 ml/min (HCC) 01/15/2015  . Hypothyroidism 11/22/2014  . COPD GOLD III  02/16/2014  . History of tobacco use 08/23/2013  . 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 CAD (coronary artery disease) 04/06/2013  . PVD - hx of Rt SFA PTA and s/p Rt 4-5th toe amp Dec 2014 04/05/2013  . Essential hypertension 11/25/2012  . Mixed hyperlipidemia 07/17/2008    Current Outpatient Medications:  .  allopurinol (ZYLOPRIM) 100 MG tablet, Take 1 tablet (100 mg total) by mouth 2 (two) times daily., Disp: 60 tablet, Rfl: 0 .  amLODipine (NORVASC) 10 MG tablet, Take 1 tablet (10 mg total) by mouth daily.,  Disp: 30 tablet, Rfl: 6 .  aspirin 81 MG chewable tablet, Chew 81 mg by mouth daily., Disp: , Rfl:  .  atorvastatin (LIPITOR) 40 MG tablet, TAKE ONE (1) TABLET BY MOUTH EVERY DAY AT 6:00PM (Patient taking differently: Take 40 mg by mouth once a day at 6 PM), Disp: 90 tablet, Rfl: 3 .  carvedilol (COREG) 25 MG tablet, Take 2 tablets (50 mg total) by mouth 2 (two) times daily with a meal., Disp: 120 tablet, Rfl: 3 .  cloNIDine (CATAPRES) 0.2 MG tablet, TAKE TWO (2) TABLETS BY MOUTH 2 TIMES DAILY (Patient taking differently: Take 0.4 mg by mouth two times a day), Disp: 120 tablet, Rfl: 3 .  colchicine 0.6 MG tablet, Take 1 tablet (0.6 mg total) by mouth 2 (two) times daily., Disp: 30 tablet, Rfl: 0 .  doxazosin (CARDURA) 8 MG tablet, Take 1 tablet (8 mg total) by mouth every evening., Disp: , Rfl:  .  ferrous sulfate 325 (65 FE) MG tablet, TAKE ONE (1) TABLET BY MOUTH TWO (2) TIMES DAILY WITH A MEAL (Patient taking differently: Take 325 mg by mouth two times a day with a meal), Disp: 60 tablet, Rfl: 3 .  glucose blood (ONETOUCH VERIO) test strip, Use as instructed to test three times daily. ICD-10 code: E11.22, Disp: 100 each, Rfl: 12 .  hydrALAZINE (APRESOLINE) 100 MG tablet, TAKE ONE TABLET BY MOUTH EVERY EIGHT HOURS (Patient taking differently: Take 100 mg by mouth every 8 hours), Disp: 90  tablet, Rfl: 1 .  insulin aspart (NOVOLOG FLEXPEN) 100 UNIT/ML FlexPen, Inject 15 Units into the skin every evening. , Disp: , Rfl:  .  isosorbide mononitrate (IMDUR) 60 MG 24 hr tablet, Take 60 mg by mouth daily., Disp: , Rfl:  .  levothyroxine (SYNTHROID, LEVOTHROID) 50 MCG tablet, Take 50 mcg by mouth daily before breakfast., Disp: , Rfl:  .  ONETOUCH DELICA LANCETS 14H MISC, 1 each by Does not apply route 3 (three) times daily. ICD-10 code: E11.22, Disp: 100 each, Rfl: 12 .  pantoprazole (PROTONIX) 40 MG tablet, TAKE ONE (1) TABLET BY MOUTH EVERY DAY (Patient taking differently: Take 40 mg by mouth once a day),  Disp: 90 tablet, Rfl: 0 .  potassium chloride SA (K-DUR,KLOR-CON) 20 MEQ tablet, Take 20 mEq by mouth daily., Disp: , Rfl:  .  torsemide (DEMADEX) 20 MG tablet, Take 3 tablets (60 mg total) by mouth daily. May take an extra 20 mg Daily for weight gain above 212 lbs (Patient taking differently: Take 20 mg by mouth 3 (three) times daily. And may take an extra 20 mg daily for weight gain above 212 lbs), Disp: 90 tablet, Rfl: 6 .  traMADol (ULTRAM) 50 MG tablet, Take 1 tablet (50 mg total) by mouth every 8 (eight) hours as needed. (Patient taking differently: Take 50 mg by mouth every 8 (eight) hours as needed (for pain). ), Disp: 30 tablet, Rfl: 0 .  warfarin (COUMADIN) 5 MG tablet, Take 2.5-5 mg by mouth See admin instructions. 2.5 mg in the evening on Sun/Mon/Tues/Wed/Thurs/Sat and 5 mg on Fri, Disp: , Rfl:  .  Blood Glucose Monitoring Suppl (ONETOUCH VERIO FLEX SYSTEM) w/Device KIT, 1 kit by Does not apply route daily., Disp: 1 kit, Rfl: 3 .  Darbepoetin Alfa (ARANESP) 500 MCG/ML SOSY injection, Inject 500 mcg into the skin every 14 (fourteen) days. , Disp: , Rfl:  .  enoxaparin (LOVENOX) 100 MG/ML injection, Inject 0.9 mLs (90 mg total) into the skin daily. Please inject ONLY 0.9 mLs (43m total) on 12/04/16. Do NOT use whole syringe, Disp: 0.9 mL, Rfl: 0 .  ferumoxytol 510 mg in sodium chloride 0.9 % 100 mL, Inject into the vein every 7 (seven) days. , Disp: , Rfl:  .  Insulin Glargine (LANTUS SOLOSTAR) 100 UNIT/ML Solostar Pen, Inject 50 Units into the skin every morning. (Patient taking differently: Inject 50 Units into the skin daily after breakfast. ), Disp: 15 mL, Rfl: 3 .  nitroGLYCERIN (NITROSTAT) 0.4 MG SL tablet, Place 1 tablet (0.4 mg total) under the tongue every 5 (five) minutes as needed for chest pain., Disp: 30 tablet, Rfl: 12 No Known Allergies   Social History   Socioeconomic History  . Marital status: Single    Spouse name: Not on file  . Number of children: Not on file  .  Years of education: Not on file  . Highest education level: Not on file  Social Needs  . Financial resource strain: Not on file  . Food insecurity - worry: Not on file  . Food insecurity - inability: Not on file  . Transportation needs - medical: Not on file  . Transportation needs - non-medical: Not on file  Occupational History  . Not on file  Tobacco Use  . Smoking status: Former Smoker    Packs/day: 1.00    Years: 46.00    Pack years: 46.00    Types: Cigarettes    Start date: 02/04/1967    Last attempt to quit:  02/03/2013    Years since quitting: 3.8  . Smokeless tobacco: Never Used  . Tobacco comment: Doing well with quitting.  Substance and Sexual Activity  . Alcohol use: No    Alcohol/week: 0.0 oz  . Drug use: No  . Sexual activity: Not Currently  Other Topics Concern  . Not on file  Social History Narrative   Lives with sister, [name] in [place].  Retired - former Sports coach.    Physical Exam  Pulmonary/Chest: No respiratory distress.  Abdominal: He exhibits no distension. There is no tenderness. There is no guarding.  Musculoskeletal: He exhibits edema.  Right foot and leg edema  Skin: Skin is warm and dry. He is not diaphoretic.        Future Appointments  Date Time Provider [place]  12/15/2016  9:45 AM Leavy Cella [facility]  12/16/2016  1:45 PM Newt Minion, MD [facility] None  12/18/2016  1:00 PM [facility] LAB 6 [facility] None  12/18/2016  1:30 PM Heath Lark, MD [facility] None  12/18/2016  2:00 PM [facility] INJ NURSE [facility] None  12/19/2016  2:00 PM [facility] HS VASC 3 [facility] VVS  12/19/2016  2:45 PM Nickel, Sharmon Leyden, NP [facility]  12/23/2016  1:45 PM [facility]  02/04/2017 10:15 AM Gardiner Barefoot, DPM [facility]    ATF pt CAO x4 sitting in recliner c/o right leg and foot pain.  Pt is still taking pain medications throughout the day. Pt has taken his  medications for this week.  Pt had [employer] for lunch, whopper with cheese and fries.  We discussed his diet again.  Pt's foot was swollen more within this week. Pt has too much pain in his foot to walk on it. rx bottles verified and pill box refilled.  BP 128/70   Pulse 74   Resp 16   SpO2 98%  cbg 366 pt stated that he was about to take insulin  **spoke with sharon @ the coumadin clinic and she advised the notes as of today is current, therefore his pill box filled accordingly.  ** rx called in: isosbide Pantoprazole colchicine  Weight yesterday-didn't weigh Last visit weight-    Brae Schaafsma, EMT Paramedic 12/09/2016    ACTION: Home visit completed

## 2016-12-11 ENCOUNTER — Ambulatory Visit: Payer: Medicaid Other | Admitting: Family Medicine

## 2016-12-15 ENCOUNTER — Other Ambulatory Visit: Payer: Self-pay | Admitting: Family Medicine

## 2016-12-15 ENCOUNTER — Encounter: Payer: Self-pay | Admitting: Pharmacist

## 2016-12-15 ENCOUNTER — Ambulatory Visit (INDEPENDENT_AMBULATORY_CARE_PROVIDER_SITE_OTHER): Payer: Medicare Other | Admitting: Pharmacist

## 2016-12-15 DIAGNOSIS — Z794 Long term (current) use of insulin: Secondary | ICD-10-CM

## 2016-12-15 DIAGNOSIS — M1A0720 Idiopathic chronic gout, left ankle and foot, without tophus (tophi): Secondary | ICD-10-CM | POA: Diagnosis not present

## 2016-12-15 DIAGNOSIS — E1122 Type 2 diabetes mellitus with diabetic chronic kidney disease: Secondary | ICD-10-CM | POA: Diagnosis not present

## 2016-12-15 DIAGNOSIS — N183 Chronic kidney disease, stage 3 (moderate): Secondary | ICD-10-CM | POA: Diagnosis not present

## 2016-12-15 DIAGNOSIS — I779 Disorder of arteries and arterioles, unspecified: Secondary | ICD-10-CM

## 2016-12-15 MED ORDER — INSULIN ASPART 100 UNIT/ML FLEXPEN: 15.0000 [IU] | PEN_INJECTOR | Freq: Two times a day (BID) | SUBCUTANEOUS | 5 refills | Status: DC

## 2016-12-15 MED ORDER — TRAMADOL HCL 50 MG PO TABS: 50.0000 mg | ORAL_TABLET | Freq: Three times a day (TID) | ORAL | 0 refills | Status: DC | PRN

## 2016-12-15 MED ORDER — COLCHICINE 0.6 MG PO TABS: 0.6000 mg | ORAL_TABLET | Freq: Two times a day (BID) | ORAL | 0 refills | Status: DC

## 2016-12-15 NOTE — Progress Notes (Signed)
Patient ID: Leonard [name], male   DOB: 1950-02-11, 66 y.o.   MRN: [id number] Reviewed: Agree with Dr. Graylin Shiver documentation and management.

## 2016-12-15 NOTE — Patient Instructions (Addendum)
Thank you for coming to see [place] today!  1. We added Novolog 15 units with lunch. You will now take Novolog 15 units with lunch AND with dinner.  2. Restart colchicine twice daily. We sent a new prescription to your pharmacy.  3. Follow up with your PCP in 2-3 weeks.

## 2016-12-15 NOTE — Assessment & Plan Note (Signed)
Left Lower Extremity Pain, swelling.   Diagnosed with Gout ~ 2 weeks prior.  Unclear if patient has taken any colchicine since leaving hospital ~10 days ago.  Resent new colchicine prescription for patient to pick up.   New prescription for tramadol 50mg  prescribed by PCP - Dr. Juanito Doom #[id number].  Paper prescription provided.

## 2016-12-15 NOTE — Assessment & Plan Note (Signed)
Diabetes longstanding currently uncontrolled. Patient denies hypoglycemic events and is able to verbalize appropriate hypoglycemia management plan. Patient reports adherence with medication. Control is suboptimal due to likely unreported nonadherence with insulin, dietary indiscretion, and lack of physical activity. Add Novolog 15 units SQ with lunch to current regimen of Novolog 15 units SQ with supper. Continue Lantus 50 units qPM. Next A1C anticipated 01/2017.

## 2016-12-15 NOTE — Progress Notes (Signed)
   S:    Chief Complaint  Patient presents with  . Medication Management    diabetes, HTN   Patient arrives in good spirits, ambulating with a walker, and accompanied by his sister, [name].  Presents for diabetes evaluation, education, and management at the request of his PCP, Dr. Juanito Doom. Patient was last seen by Primary Care Provider on 11/14/16. He was also recently admitted to the hospital on 10/29 for LLE pain and swelling. LLE swelling is not significantly improved. Does appear somewhat less swollen and patient does not appear to be in significant pain, but it is not returned to normal. Diagnosed with gout at hospital admission, discharged 12/03/16 on allopurinol and colchicine. However, patient has unknowingly not been taking colchicine. Patient brought in all of his medications and did not have a colchicine bottle. When showed pictures of the tablets, they did not appear familiar. Patient also brought in his pill box and the colchicine tablets were not found there. Called [facility] and they have record of the prescription being delivered on 11/2. Patient examined by Dr. Mingo Amber today. Suspect that swelling may not be improved because patient has not been taking colchicine as instructed. May also be related to joint damage 2/2 diabetes. Patient to follow up with Dr. Sharol Given tomorrow. Patient also has a small wound on his R big toe that is much improved.  Patient reports adherence with medications. He uses a pill box to help him organize his medications. Current diabetes medications include: Novolog 15 units SQ with supper, Lantus 50 units SQ qAM Current hypertension medications include: amlodipine 10mg  PO daily, carvedilol 50mg  PO BID, clonidine 0.4mg  PO BID, doxazosin 8mg  PO qHS, isosoride mononitrate 60mg  PO daily, torsemide 20mg  PO TID  Patient denies hypoglycemic events.  Patient was recently discharged from hospital and all medications have been reviewed.  O:  Physical Exam    Constitutional: He appears well-developed and well-nourished.   Review of Systems  Cardiovascular: Positive for leg swelling.   Lab Results  Component Value Date   HGBA1C 11.0 10/10/2016   Vitals:   12/15/16 1014  BP: 130/68   Patient only checks BG in the AM Home fasting CBG: 200s (patient-reported)   10 year ASCVD risk: 31.5%  A/P: Diabetes longstanding currently uncontrolled. Patient denies hypoglycemic events and is able to verbalize appropriate hypoglycemia management plan. Patient reports adherence with medication. Control is suboptimal due to likely unreported nonadherence with insulin, dietary indiscretion, and lack of physical activity. Add Novolog 15 units SQ with lunch to current regimen of Novolog 15 units SQ with supper. Continue Lantus 50 units qPM. Next A1C anticipated 01/2017.    ASCVD risk greater than 7.5%. Continue ASA 81mg  daily and atorvastatin 40mg  daily.   Hypertension longstanding currently controlled. Patient reports adherence with medication. Continue current medications.  Left Lower Extremity Pain, swelling.   Diagnosed with Gout ~ 2 weeks prior.  Unclear if patient has taken any colchicine since leaving hospital.  Resent new colchicine prescription for patient to pick up.   New prescription for tramadol 50mg  prescribed by PCP - Dr. Juanito Doom #[id number].  Paper prescription provided.   Written patient instructions provided.  Total time in face to face counseling 30 minutes.   Follow up with PCP in 2-3 weeks. Patient seen with Drusilla Kanner, PharmD Candidate, and Leeroy Cha, PharmD PGY-1 Resident.

## 2016-12-16 ENCOUNTER — Encounter (INDEPENDENT_AMBULATORY_CARE_PROVIDER_SITE_OTHER): Payer: Self-pay | Admitting: Orthopedic Surgery

## 2016-12-16 ENCOUNTER — Ambulatory Visit (INDEPENDENT_AMBULATORY_CARE_PROVIDER_SITE_OTHER): Payer: Medicare Other | Admitting: Orthopedic Surgery

## 2016-12-16 DIAGNOSIS — M1A0720 Idiopathic chronic gout, left ankle and foot, without tophus (tophi): Secondary | ICD-10-CM

## 2016-12-16 MED ORDER — METHYLPREDNISOLONE ACETATE 40 MG/ML IJ SUSP
40.0000 mg | INTRAMUSCULAR | Status: AC | PRN
  Administered 2016-12-16: 40 mg via INTRA_ARTICULAR

## 2016-12-16 MED ORDER — LIDOCAINE HCL 1 % IJ SOLN
2.0000 mL | INTRAMUSCULAR | Status: AC | PRN
  Administered 2016-12-16: 2 mL

## 2016-12-16 NOTE — Telephone Encounter (Signed)
Pharmacy had appointment with pt on 12/15/16 and I told them to go over his messages with him in reference to some of his medications. Katharina Caper, Lindee Leason D, [name]

## 2016-12-16 NOTE — Progress Notes (Signed)
Office Visit Note   Patient: Leonard [name]           Date of Birth: 12-26-1950           MRN: [id number] Visit Date: 12/16/2016              Requested by: [name], MD [address] [place], [place] [address] PCP: [name], MD  Chief Complaint  Patient presents with  . Left [facility] follow up 12/02/16      HPI: Patient is a 66 year old gentleman who is seen in follow-up for gout left ankle.  Patient's last uric acid level was drawn at the hospital was 7.7 he was initially prescribed allopurinol and colchicine and patient states that he was not taking his medications he is just had been provided by his primary care physician.  Patient complains of pain and swelling in the left lower extremity he is ambulating with slippers and a rolling walker.  Assessment & Plan: Visit Diagnoses:  1. Idiopathic chronic gout of left foot without tophus     Plan: Patient was given prescription for 15-20 mm of compression knee-high stockings to be worn daily.  The ankle was injected.  Discussed with his acute symptoms resolved to stop taking the colchicine and to continue taking the allopurinol forever.  This was reviewed with patient and his wife and they state they understand the instructions.  Follow-Up Instructions: Return in about 3 weeks (around 01/06/2017).   Ortho Exam  Patient is alert, oriented, no adenopathy, well-dressed, normal affect, normal respiratory effort. [place] patient has significance swelling pitting edema and swelling in left lower extremity compared to the right.  There are no open ulcers or weeping edema there are skin color changes.  He does have a palpable dorsalis pedis pulse.  He has swelling around the ankle and there is no warmth no redness no tenderness to palpation he has good motor function.  After informed consent the ankle was injected without complications.  He tolerated this well.  Patient has difficulty getting  from a sitting to a standing position to use his rolling walker he has an antalgic gait.  Imaging: No results found. No images are attached to the encounter.  Labs: Lab Results  Component Value Date   HGBA1C 11.0 10/10/2016   HGBA1C 11.1 07/01/2016   HGBA1C 9.7 01/09/2016   ESRSEDRATE 130 (H) 12/01/2016   ESRSEDRATE 25 (H) 04/04/2013   ESRSEDRATE 125 (H) 02/16/2013   CRP 7.3 (H) 12/01/2016   CRP <0.5 (L) 04/04/2013   CRP 2.6 (H) 02/16/2013   LABURIC 7.7 (H) 12/02/2016   LABURIC 10.9 (H) 09/17/2016   REPTSTATUS 12/06/2016 FINAL 12/01/2016   GRAMSTAIN  01/28/2013    RARE WBC PRESENT, PREDOMINANTLY PMN NO SQUAMOUS EPITHELIAL CELLS SEEN NO ORGANISMS SEEN Performed at [facility]  01/28/2013    RARE WBC PRESENT, PREDOMINANTLY PMN NO SQUAMOUS EPITHELIAL CELLS SEEN NO ORGANISMS SEEN Performed at [facility]   CULT NO GROWTH 5 DAYS 12/01/2016   LABORGA ESCHERICHIA COLI (A) 01/14/2016    Orders:  No orders of the defined types were placed in this encounter.  No orders of the defined types were placed in this encounter.    Procedures: Medium Joint Inj: L ankle on 12/16/2016 2:33 PM Indications: pain and diagnostic evaluation Details: 22 G 1.5 in needle, anteromedial approach Medications: 2 mL lidocaine 1 %; 40 mg methylPREDNISolone acetate 40 MG/ML Outcome: tolerated  well, no immediate complications Procedure, treatment alternatives, risks and benefits explained, specific risks discussed. Consent was given by the patient. Immediately prior to procedure a time out was called to verify the correct patient, procedure, equipment, support staff and site/side marked as required. Patient was prepped and draped in the usual sterile fashion.      Clinical Data: No additional findings.  ROS:  All other systems negative, except as noted in the HPI. Review of Systems  Objective: Vital Signs: There were no vitals taken for this visit.  Specialty  Comments:  No specialty comments available.  PMFS History: Patient Active Problem List   Diagnosis Date Noted  . Acute osteomyelitis of left talus (HCC)   . Idiopathic chronic gout of left foot without tophus   . Osteomyelitis ([place]) 12/01/2016  . Acute osteomyelitis of left foot ([place])   . Left foot pain 10/29/2016  . Long term (current) use of anticoagulants [Z79.01] 09/09/2016  . Chronic deep vein thrombosis (DVT) of femoral vein of left lower extremity ([place]) 09/03/2016  . Morbid obesity due to excess calories ([place]) 03/17/2016  . Anemia due to stage 4 chronic kidney disease treated with darbepoetin ([place]) 02/28/2016  . Fatigue associated with anemia 01/21/2016  . Diabetic retinopathy ([place]) 11/13/2015  . CHF (congestive heart failure) ([place]) 08/27/2015  . Iron deficiency anemia 08/03/2015  . Claudication ([place])   . Type 2 diabetes mellitus with stage 3 chronic kidney disease, with long-term current use of insulin ([place])   . Hematuria 06/27/2015  . Skin lesion of face 02/22/2015  . 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 Deficiency anemia 01/15/2015  . CKD (chronic kidney disease) stage 4, GFR 15-29 ml/min (HCC) 01/15/2015  . Hypothyroidism 11/22/2014  . COPD GOLD III  02/16/2014  . History of tobacco use 08/23/2013  . 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 CAD (coronary artery disease) 04/06/2013  . PVD - hx of Rt SFA PTA and s/p Rt 4-5th toe amp Dec 2014 04/05/2013  . Essential hypertension 11/25/2012  . Mixed hyperlipidemia 07/17/2008   Past Medical History:  Diagnosis Date  . Cancer ([place])    skin cancer-  . CHF (congestive heart failure) ([place])   . Chronic kidney disease    not on dialysis  . Coronary artery disease   . Gangrene of toe ([place])    right 5th/notes 01/04/2013   . High cholesterol   . Hypertension   . Iron deficiency anemia 08/03/2015  . 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 PAD (peripheral artery disease) ([place])   .  Shortness of breath dyspnea    with exertion  . Type II diabetes mellitus (HCC)    Type II    Family History  Problem Relation Age of Onset  . Cancer Mother        lung cancer  . Hypertension Mother   . Cancer Sister        patient thinks it was uterine cancer  . Hypertension Sister   . Heart attack Sister   . Stroke Neg Hx     Past Surgical History:  Procedure Laterality Date  . ANGIOPLASTY / STENTING FEMORAL Right 01/13/2013   superficial femoral artery x 2 (3 mm x 60 mm, 4 mm x 60 mm)  . COLONOSCOPY W/ POLYPECTOMY    . SKIN CANCER EXCISION  2017  . THROAT SURGERY  1983   polyps  . TONSILLECTOMY AND ADENOIDECTOMY  ~ 1965   Social History   Occupational  History  . Not on file  Tobacco Use  . Smoking status: Former Smoker    Packs/day: 1.00    Years: 46.00    Pack years: 46.00    Types: Cigarettes    Start date: 02/04/1967    Last attempt to quit: 02/03/2013    Years since quitting: 3.8  . Smokeless tobacco: Never Used  . Tobacco comment: Doing well with quitting.  Substance and Sexual Activity  . Alcohol use: No    Alcohol/week: 0.0 oz  . Drug use: No  . Sexual activity: Not Currently

## 2016-12-17 ENCOUNTER — Other Ambulatory Visit (HOSPITAL_COMMUNITY): Payer: Self-pay

## 2016-12-17 NOTE — Progress Notes (Signed)
Paramedicine Encounter    Patient ID: Leonard [name], male    DOB: February 17, 1950, 66 y.o.   MRN: [id number]    Patient Care Team: [name], MD as PCP - General (Family Medicine) [name], MD as Consulting Physician (Nephrology)  Patient Active Problem List   Diagnosis Date Noted  . Acute osteomyelitis of left talus (HCC)   . Idiopathic chronic gout of left foot without tophus   . Osteomyelitis ([place]) 12/01/2016  . Acute osteomyelitis of left foot ([place])   . Left foot pain 10/29/2016  . Long term (current) use of anticoagulants [Z79.01] 09/09/2016  . Chronic deep vein thrombosis (DVT) of femoral vein of left lower extremity ([place]) 09/03/2016  . Morbid obesity due to excess calories ([place]) 03/17/2016  . Anemia due to stage 4 chronic kidney disease treated with darbepoetin ([place]) 02/28/2016  . Fatigue associated with anemia 01/21/2016  . Diabetic retinopathy ([place]) 11/13/2015  . CHF (congestive heart failure) ([place]) 08/27/2015  . Iron deficiency anemia 08/03/2015  . Claudication ([place])   . Type 2 diabetes mellitus with stage 3 chronic kidney disease, with long-term current use of insulin ([place])   . Hematuria 06/27/2015  . Skin lesion of face 02/22/2015  . 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 Deficiency anemia 01/15/2015  . CKD (chronic kidney disease) stage 4, GFR 15-29 ml/min (HCC) 01/15/2015  . Hypothyroidism 11/22/2014  . COPD GOLD III  02/16/2014  . History of tobacco use 08/23/2013  . 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 CAD (coronary artery disease) 04/06/2013  . PVD - hx of Rt SFA PTA and s/p Rt 4-5th toe amp Dec 2014 04/05/2013  . Essential hypertension 11/25/2012  . Mixed hyperlipidemia 07/17/2008    Current Outpatient Medications:  .  allopurinol (ZYLOPRIM) 100 MG tablet, Take 1 tablet (100 mg total) by mouth 2 (two) times daily., Disp: 60 tablet, Rfl: 0 .  amLODipine (NORVASC) 10 MG tablet, Take 1 tablet (10 mg total) by mouth daily.,  Disp: 30 tablet, Rfl: 6 .  aspirin 81 MG chewable tablet, Chew 81 mg by mouth daily., Disp: , Rfl:  .  atorvastatin (LIPITOR) 40 MG tablet, TAKE ONE (1) TABLET BY MOUTH EVERY DAY AT 6:00PM (Patient taking differently: Take 40 mg by mouth once a day at 6 PM), Disp: 90 tablet, Rfl: 3 .  carvedilol (COREG) 25 MG tablet, Take 2 tablets (50 mg total) by mouth 2 (two) times daily with a meal., Disp: 120 tablet, Rfl: 3 .  cloNIDine (CATAPRES) 0.2 MG tablet, TAKE TWO (2) TABLETS BY MOUTH 2 TIMES DAILY (Patient taking differently: Take 0.4 mg by mouth two times a day), Disp: 120 tablet, Rfl: 3 .  colchicine 0.6 MG tablet, Take 1 tablet (0.6 mg total) 2 (two) times daily by mouth., Disp: 30 tablet, Rfl: 0 .  doxazosin (CARDURA) 8 MG tablet, Take 1 tablet (8 mg total) by mouth every evening., Disp: , Rfl:  .  ferrous sulfate 325 (65 FE) MG tablet, TAKE ONE (1) TABLET BY MOUTH TWO (2) TIMES DAILY WITH A MEAL (Patient taking differently: Take 325 mg by mouth two times a day with a meal), Disp: 60 tablet, Rfl: 3 .  hydrALAZINE (APRESOLINE) 100 MG tablet, TAKE ONE TABLET BY MOUTH EVERY EIGHT HOURS (Patient taking differently: Take 100 mg by mouth every 8 hours), Disp: 90 tablet, Rfl: 1 .  isosorbide mononitrate (IMDUR) 60 MG 24 hr tablet, Take 60 mg by mouth daily., Disp: , Rfl:  .  levothyroxine (SYNTHROID, LEVOTHROID) 50 MCG tablet, Take 50 mcg by mouth daily before breakfast., Disp: , Rfl:  .  pantoprazole (PROTONIX) 40 MG tablet, TAKE ONE (1) TABLET BY MOUTH EVERY DAY (Patient taking differently: Take 40 mg by mouth once a day), Disp: 90 tablet, Rfl: 0 .  potassium chloride SA (K-DUR,KLOR-CON) 20 MEQ tablet, Take 20 mEq by mouth daily., Disp: , Rfl:  .  torsemide (DEMADEX) 20 MG tablet, Take 3 tablets (60 mg total) by mouth daily. May take an extra 20 mg Daily for weight gain above 212 lbs (Patient taking differently: Take 20 mg by mouth 3 (three) times daily. And may take an extra 20 mg daily for weight gain  above 212 lbs), Disp: 90 tablet, Rfl: 6 .  Blood Glucose Monitoring Suppl (ONETOUCH VERIO FLEX SYSTEM) w/Device KIT, 1 kit by Does not apply route daily., Disp: 1 kit, Rfl: 3 .  Darbepoetin Alfa (ARANESP) 500 MCG/ML SOSY injection, Inject 500 mcg into the skin every 14 (fourteen) days. , Disp: , Rfl:  .  enoxaparin (LOVENOX) 100 MG/ML injection, Inject 0.9 mLs (90 mg total) into the skin daily. Please inject ONLY 0.9 mLs (83m total) on 12/04/16. Do NOT use whole syringe, Disp: 0.9 mL, Rfl: 0 .  ferumoxytol 510 mg in sodium chloride 0.9 % 100 mL, Inject into the vein every 7 (seven) days. , Disp: , Rfl:  .  glucose blood (ONETOUCH VERIO) test strip, Use as instructed to test three times daily. ICD-10 code: E11.22, Disp: 100 each, Rfl: 12 .  insulin aspart (NOVOLOG FLEXPEN) 100 UNIT/ML FlexPen, Inject 15 Units 2 (two) times daily before lunch and supper into the skin., Disp: 15 mL, Rfl: 5 .  Insulin Glargine (LANTUS SOLOSTAR) 100 UNIT/ML Solostar Pen, Inject 50 Units into the skin every morning. (Patient taking differently: Inject 50 Units into the skin daily after breakfast. ), Disp: 15 mL, Rfl: 3 .  nitroGLYCERIN (NITROSTAT) 0.4 MG SL tablet, Place 1 tablet (0.4 mg total) under the tongue every 5 (five) minutes as needed for chest pain., Disp: 30 tablet, Rfl: 12 .  ONETOUCH DELICA LANCETS 328UMISC, 1 each by Does not apply route 3 (three) times daily. ICD-10 code: E11.22, Disp: 100 each, Rfl: 12 .  traMADol (ULTRAM) 50 MG tablet, Take 1 tablet (50 mg total) every 8 (eight) hours as needed by mouth (for pain)., Disp: 30 tablet, Rfl: 0 .  warfarin (COUMADIN) 5 MG tablet, Take 2.5-5 mg by mouth See admin instructions. 2.5 mg in the evening on Sun/Mon/Tues/Wed/Thurs/Sat and 5 mg on Fri, Disp: , Rfl:  No Known Allergies   Social History   Socioeconomic History  . Marital status: Single    Spouse name: Not on file  . Number of children: Not on file  . Years of education: Not on file  . Highest  education level: Not on file  Social Needs  . Financial resource strain: Not on file  . Food insecurity - worry: Not on file  . Food insecurity - inability: Not on file  . Transportation needs - medical: Not on file  . Transportation needs - non-medical: Not on file  Occupational History  . Not on file  Tobacco Use  . Smoking status: Former Smoker    Packs/day: 1.00    Years: 46.00    Pack years: 46.00    Types: Cigarettes    Start date: 02/04/1967    Last attempt to quit: 02/03/2013    Years since quitting: 3.8  .  Smokeless tobacco: Never Used  . Tobacco comment: Doing well with quitting.  Substance and Sexual Activity  . Alcohol use: No    Alcohol/week: 0.0 oz  . Drug use: No  . Sexual activity: Not Currently  Other Topics Concern  . Not on file  Social History Narrative   Lives with sister, Leonard [name] in [place].  Retired - former Sports coach.    Physical Exam  Pulmonary/Chest: No respiratory distress.  Abdominal: He exhibits no distension. There is no tenderness. There is no guarding.  Musculoskeletal: He exhibits edema.  LEFT FOOT HX OF SAME  Skin: Skin is warm and dry. He is not diaphoretic.        Future Appointments  Date Time Provider [place]  12/18/2016  1:00 PM [facility] LAB 4 [facility] None  12/18/2016  1:30 PM Heath Lark, MD [facility] None  12/18/2016  2:00 PM [facility] FLUSH NURSE [facility] None  12/19/2016  2:00 PM [facility] VASC 3 [facility]  12/19/2016  2:45 PM Nickel, Sharmon Leyden, NP [facility]  12/23/2016  1:45 PM [facility]  12/29/2016  2:30 PM [name], MD [facility]  01/06/2017  2:15 PM Newt Minion, MD [facility] None  02/04/2017 10:15 AM Gardiner Barefoot, DPM [facility]    ATF pt CAO x4 sitting in the recliner with no complaints. Pt stated that he hasn't taken his morning meds because he hasn't eaten breakfast yet. Pt denies sob, dizziness and chest pain.   Pt also denies bleeding. Pt vitals noted; we still continue talking about his diet. rx bottles verified and pill box   *WARFARIN WAS PLACED IN HIS PILL BOX ACCORDING TO HIS LAST INR NOTES. PT WOULD LIKE TO TAKE COLCHICINE PRN BP 140/60   Pulse 67   Resp 16   Wt 191 lb (86.6 kg)   SpO2 99%   BMI 29.04 kg/m   Weight yesterday-cant remember his weight Last visit weight-195 (12/05/16)  **rx called in:  warfarin Potassium pantoprazole  Leonard [name], EMT Paramedic 12/17/2016    ACTION: Home visit completed

## 2016-12-18 ENCOUNTER — Ambulatory Visit (HOSPITAL_BASED_OUTPATIENT_CLINIC_OR_DEPARTMENT_OTHER): Payer: Medicare Other | Admitting: Hematology and Oncology

## 2016-12-18 ENCOUNTER — Ambulatory Visit (HOSPITAL_COMMUNITY)
Admission: RE | Admit: 2016-12-18 | Discharge: 2016-12-18 | Disposition: A | Discharge status: Home / Self Care | Payer: Medicare Other | Source: Ambulatory Visit | Attending: Hematology and Oncology | Admitting: Hematology and Oncology

## 2016-12-18 ENCOUNTER — Ambulatory Visit (HOSPITAL_BASED_OUTPATIENT_CLINIC_OR_DEPARTMENT_OTHER): Payer: Medicare Other

## 2016-12-18 ENCOUNTER — Telehealth: Payer: Self-pay | Admitting: Hematology and Oncology

## 2016-12-18 ENCOUNTER — Encounter: Payer: Self-pay | Admitting: Hematology and Oncology

## 2016-12-18 ENCOUNTER — Other Ambulatory Visit (HOSPITAL_COMMUNITY): Payer: Self-pay | Admitting: *Deleted

## 2016-12-18 ENCOUNTER — Other Ambulatory Visit (HOSPITAL_BASED_OUTPATIENT_CLINIC_OR_DEPARTMENT_OTHER): Payer: Medicare Other

## 2016-12-18 VITALS — BP 133/64 | HR 69 | Temp 98.1°F | Resp 17 | Ht 68.0 in | Wt 196.2 lb

## 2016-12-18 DIAGNOSIS — D631 Anemia in chronic kidney disease: Secondary | ICD-10-CM | POA: Diagnosis not present

## 2016-12-18 DIAGNOSIS — N184 Chronic kidney disease, stage 4 (severe): Secondary | ICD-10-CM | POA: Insufficient documentation

## 2016-12-18 DIAGNOSIS — D509 Iron deficiency anemia, unspecified: Secondary | ICD-10-CM | POA: Diagnosis not present

## 2016-12-18 DIAGNOSIS — N185 Chronic kidney disease, stage 5: Principal | ICD-10-CM

## 2016-12-18 LAB — CBC WITH DIFFERENTIAL/PLATELET
BASO%: 0.5 % (ref 0.0–2.0)
BASOS ABS: 0 10*3/uL (ref 0.0–0.1)
EOS%: 0 % (ref 0.0–7.0)
Eosinophils Absolute: 0 10*3/uL (ref 0.0–0.5)
HCT: 23.2 % — ABNORMAL LOW (ref 38.4–49.9)
HGB: 7.2 g/dL — ABNORMAL LOW (ref 13.0–17.1)
LYMPH%: 13.4 % — AB (ref 14.0–49.0)
MCH: 22.2 pg — AB (ref 27.2–33.4)
MCHC: 31.2 g/dL — AB (ref 32.0–36.0)
MCV: 71.1 fL — AB (ref 79.3–98.0)
MONO#: 0.4 10*3/uL (ref 0.1–0.9)
MONO%: 8 % (ref 0.0–14.0)
NEUT#: 4.4 10*3/uL (ref 1.5–6.5)
NEUT%: 78.1 % — AB (ref 39.0–75.0)
PLATELETS: 318 10*3/uL (ref 140–400)
RBC: 3.27 10*6/uL — AB (ref 4.20–5.82)
RDW: 21.6 % — ABNORMAL HIGH (ref 11.0–14.6)
WBC: 5.6 10*3/uL (ref 4.0–10.3)
lymph#: 0.8 10*3/uL — ABNORMAL LOW (ref 0.9–3.3)

## 2016-12-18 LAB — PREPARE RBC (CROSSMATCH)

## 2016-12-18 MED ORDER — DARBEPOETIN ALFA 500 MCG/ML IJ SOSY
500.0000 ug | PREFILLED_SYRINGE | Freq: Once | INTRAMUSCULAR | Status: AC
  Administered 2016-12-18: 500 ug via SUBCUTANEOUS
  Filled 2016-12-18: qty 1

## 2016-12-18 NOTE — Telephone Encounter (Signed)
Gave patient avs and calendar with appts per 11/15 sch msg.

## 2016-12-19 ENCOUNTER — Ambulatory Visit (HOSPITAL_COMMUNITY)
Admission: RE | Admit: 2016-12-19 | Discharge: 2016-12-19 | Disposition: A | Discharge status: Home / Self Care | Payer: Medicare Other | Source: Ambulatory Visit | Attending: Vascular Surgery | Admitting: Vascular Surgery

## 2016-12-19 ENCOUNTER — Encounter: Payer: Self-pay | Admitting: Family

## 2016-12-19 ENCOUNTER — Encounter: Payer: Self-pay | Admitting: Hematology and Oncology

## 2016-12-19 ENCOUNTER — Ambulatory Visit (INDEPENDENT_AMBULATORY_CARE_PROVIDER_SITE_OTHER): Payer: Medicare Other | Admitting: Family

## 2016-12-19 VITALS — BP 127/68 | HR 73 | Temp 97.2°F | Resp 18 | Wt 194.1 lb

## 2016-12-19 DIAGNOSIS — I779 Disorder of arteries and arterioles, unspecified: Secondary | ICD-10-CM | POA: Diagnosis not present

## 2016-12-19 DIAGNOSIS — Z87891 Personal history of nicotine dependence: Secondary | ICD-10-CM | POA: Diagnosis not present

## 2016-12-19 DIAGNOSIS — Z9862 Peripheral vascular angioplasty status: Secondary | ICD-10-CM | POA: Diagnosis not present

## 2016-12-19 LAB — IRON AND TIBC
%SAT: 27 % (ref 20–55)
Iron: 47 ug/dL (ref 42–163)
TIBC: 178 ug/dL — ABNORMAL LOW (ref 202–409)
UIBC: 130 ug/dL (ref 117–376)

## 2016-12-19 LAB — SEDIMENTATION RATE: SED RATE: 118 mm/h — AB (ref 0–30)

## 2016-12-19 LAB — FERRITIN: Ferritin: 771 ng/ml — ABNORMAL HIGH (ref 22–316)

## 2016-12-19 NOTE — Progress Notes (Signed)
[place] OFFICE PROGRESS NOTE  Patient Care Team: [name], MD as PCP - General (Family Medicine) [name], MD as Consulting Physician (Nephrology)  SUMMARY OF ONCOLOGIC HISTORY:  Leonard [name] is here because of progressive anemia, renal failure and MGUS. I reviewed reports from his nephrologist office. The patient have significant peripheral vascular disease, diabetes and hypertension. Early this year, he had baseline creatinine of around 1.3 and subsequently developed heart failure. He was placed on aggressive medication/regimen and subsequently was noted to have progressive renal failure and anemia. According to his blood work dated 01/03/2015, white blood cell count is at 6.6, hemoglobin 7.1, platelet count of 153, creatinine of 2.4, calcium of 8.9, MCV 76, iron saturation 15%, ferritin 180, serum vitamin B-12 of 360. Serum protein electrophoresis initially detected M spike but subsequently not quantifiable. Urine immunofixation showed Bence-Jones Proteinuria. Echocardiogram show preserved ejection fraction 55-60% but with evidence of diastolic heart failure. He denies history of abnormal bone pain or bone fracture. Patient denies history of recurrent infection or atypical infections such as shingles of meningitis. Denies chills, night sweats, anorexia or abnormal weight loss. He complained of fatigue and shortness of breath on minimal exertion. The patient denies any recent signs or symptoms of bleeding such as spontaneous hematuria or hematochezia. He has occasional epistaxis when he blows his nose. He does not have spontaneous epistaxis Evaluation in December 2016 show he had anemia chronic disease and MGUS  On 02/01/2015, he is started on Aranesp, 200 g every 2 weeks to keep hemoglobin greater than 11 Between December to June 2017, the dose of Aranesp is titrated to maximum 500 g From February 2018 till present, he received intermittent  intravenous iron infusion along with several units of blood transfusion  INTERVAL HISTORY: Please see below for problem oriented charting. The patient was almost an hour and a half late for his appointment He was recently hospitalized again for osteomyelitis of the left foot He denies recent fever or chills He missed his bone marrow biopsy appointment He has not been transfused lately He came back fatigue He denies chest pain or shortness of breath The patient denies any recent signs or symptoms of bleeding such as spontaneous epistaxis, hematuria or hematochezia.   REVIEW OF SYSTEMS:   Constitutional: Denies fevers, chills or abnormal weight loss Eyes: Denies blurriness of vision Ears, nose, mouth, throat, and face: Denies mucositis or sore throat Respiratory: Denies cough, dyspnea or wheezes Cardiovascular: Denies palpitation, chest discomfort or lower extremity swelling Gastrointestinal:  Denies nausea, heartburn or change in bowel habits Skin: Denies abnormal skin rashes Lymphatics: Denies new lymphadenopathy or easy bruising Neurological:Denies numbness, tingling or new weaknesses Behavioral/Psych: Mood is stable, no new changes  All other systems were reviewed with the patient and are negative.  I have reviewed the past medical history, past surgical history, social history and family history with the patient and they are unchanged from previous note.  ALLERGIES:  has No Known Allergies.  MEDICATIONS:  Current Outpatient Medications  Medication Sig Dispense Refill  . allopurinol (ZYLOPRIM) 100 MG tablet Take 1 tablet (100 mg total) by mouth 2 (two) times daily. 60 tablet 0  . amLODipine (NORVASC) 10 MG tablet Take 1 tablet (10 mg total) by mouth daily. 30 tablet 6  . aspirin 81 MG chewable tablet Chew 81 mg by mouth daily.    Marland Kitchen atorvastatin (LIPITOR) 40 MG tablet TAKE ONE (1) TABLET BY MOUTH EVERY DAY AT 6:00PM (Patient taking differently: Take 40  mg by mouth once a day at  6 PM) 90 tablet 3  . Blood Glucose Monitoring Suppl (ONETOUCH VERIO FLEX SYSTEM) w/Device KIT 1 kit by Does not apply route daily. 1 kit 3  . carvedilol (COREG) 25 MG tablet Take 2 tablets (50 mg total) by mouth 2 (two) times daily with a meal. 120 tablet 3  . cloNIDine (CATAPRES) 0.2 MG tablet TAKE TWO (2) TABLETS BY MOUTH 2 TIMES DAILY (Patient taking differently: Take 0.4 mg by mouth two times a day) 120 tablet 3  . colchicine 0.6 MG tablet Take 1 tablet (0.6 mg total) 2 (two) times daily by mouth. 30 tablet 0  . Darbepoetin Alfa (ARANESP) 500 MCG/ML SOSY injection Inject 500 mcg into the skin every 14 (fourteen) days.     Marland Kitchen doxazosin (CARDURA) 8 MG tablet Take 1 tablet (8 mg total) by mouth every evening.    . enoxaparin (LOVENOX) 100 MG/ML injection Inject 0.9 mLs (90 mg total) into the skin daily. Please inject ONLY 0.9 mLs (52m total) on 12/04/16. Do NOT use whole syringe 0.9 mL 0  . ferrous sulfate 325 (65 FE) MG tablet TAKE ONE (1) TABLET BY MOUTH TWO (2) TIMES DAILY WITH A MEAL (Patient taking differently: Take 325 mg by mouth two times a day with a meal) 60 tablet 3  . ferumoxytol 510 mg in sodium chloride 0.9 % 100 mL Inject into the vein every 7 (seven) days.     .Marland Kitchenglucose blood (ONETOUCH VERIO) test strip Use as instructed to test three times daily. ICD-10 code: E11.22 100 each 12  . hydrALAZINE (APRESOLINE) 100 MG tablet TAKE ONE TABLET BY MOUTH EVERY EIGHT HOURS (Patient taking differently: Take 100 mg by mouth every 8 hours) 90 tablet 1  . insulin aspart (NOVOLOG FLEXPEN) 100 UNIT/ML FlexPen Inject 15 Units 2 (two) times daily before lunch and supper into the skin. 15 mL 5  . Insulin Glargine (LANTUS SOLOSTAR) 100 UNIT/ML Solostar Pen Inject 50 Units into the skin every morning. (Patient taking differently: Inject 50 Units into the skin daily after breakfast. ) 15 mL 3  . isosorbide mononitrate (IMDUR) 60 MG 24 hr tablet Take 60 mg by mouth daily.    .Marland Kitchenlevothyroxine (SYNTHROID,  LEVOTHROID) 50 MCG tablet Take 50 mcg by mouth daily before breakfast.    . nitroGLYCERIN (NITROSTAT) 0.4 MG SL tablet Place 1 tablet (0.4 mg total) under the tongue every 5 (five) minutes as needed for chest pain. 30 tablet 12  . ONETOUCH DELICA LANCETS 340JMISC 1 each by Does not apply route 3 (three) times daily. ICD-10 code: E11.22 100 each 12  . pantoprazole (PROTONIX) 40 MG tablet TAKE ONE (1) TABLET BY MOUTH EVERY DAY (Patient taking differently: Take 40 mg by mouth once a day) 90 tablet 0  . potassium chloride SA (K-DUR,KLOR-CON) 20 MEQ tablet Take 20 mEq by mouth daily.    .Marland Kitchentorsemide (DEMADEX) 20 MG tablet Take 3 tablets (60 mg total) by mouth daily. May take an extra 20 mg Daily for weight gain above 212 lbs (Patient taking differently: Take 20 mg by mouth 3 (three) times daily. And may take an extra 20 mg daily for weight gain above 212 lbs) 90 tablet 6  . traMADol (ULTRAM) 50 MG tablet Take 1 tablet (50 mg total) every 8 (eight) hours as needed by mouth (for pain). 30 tablet 0  . warfarin (COUMADIN) 5 MG tablet Take 2.5-5 mg by mouth See admin instructions. 2.5 mg in  the evening on Sun/Mon/Tues/Wed/Thurs/Sat and 5 mg on Fri     No current facility-administered medications for this visit.     PHYSICAL EXAMINATION: ECOG PERFORMANCE STATUS: 2 - Symptomatic, <50% confined to bed  Vitals:   12/18/16 1548  BP: 133/64  Pulse: 69  Resp: 17  Temp: 98.1 F (36.7 C)  SpO2: 100%   Filed Weights   12/18/16 1548  Weight: 196 lb 3.2 oz (89 kg)    GENERAL:alert, no distress and comfortable SKIN: skin color, texture, turgor are normal, no rashes or significant lesions EYES: normal, Conjunctiva are pale and non-injected, sclera clear OROPHARYNX:no exudate, no erythema and lips, buccal mucosa, and tongue normal  NECK: supple, thyroid normal size, non-tender, without nodularity LYMPH:  no palpable lymphadenopathy in the cervical, axillary or inguinal LUNGS: clear to auscultation and  percussion with normal breathing effort HEART: regular rate & rhythm and no murmurs and no lower extremity edema ABDOMEN:abdomen soft, non-tender and normal bowel sounds Musculoskeletal:no cyanosis of digits and no clubbing  NEURO: alert & oriented x 3 with fluent speech, no focal motor/sensory deficits  LABORATORY DATA:  I have reviewed the data as listed    Component Value Date/Time   NA 136 12/01/2016 1400   NA CANCELED 07/01/2016 1434   NA 140 08/16/2015 1554   K 4.1 12/01/2016 1400   K 3.8 08/16/2015 1554   CL 102 12/01/2016 1400   CO2 23 12/01/2016 1400   CO2 21 (L) 08/16/2015 1554   GLUCOSE 144 (H) 12/01/2016 1400   GLUCOSE 133 08/16/2015 1554   BUN 36 (H) 12/01/2016 1400   BUN CANCELED 07/01/2016 1434   BUN 51.0 (H) 08/16/2015 1554   CREATININE 2.52 (H) 12/01/2016 1400   CREATININE 3.64 (H) 01/09/2016 1606   CREATININE 2.7 (H) 08/16/2015 1554   CALCIUM 8.4 (L) 12/01/2016 1400   CALCIUM NOPLAS 10/24/2015 1415   CALCIUM 8.9 08/16/2015 1554   PROT 6.8 12/01/2016 1400   PROT 7.0 08/16/2015 1554   ALBUMIN 2.5 (L) 12/01/2016 1400   ALBUMIN 3.4 (L) 08/16/2015 1554   AST 10 (L) 12/01/2016 1400   AST 17 08/16/2015 1554   ALT 7 (L) 12/01/2016 1400   ALT 13 08/16/2015 1554   ALKPHOS 89 12/01/2016 1400   ALKPHOS 89 08/16/2015 1554   BILITOT 0.3 12/01/2016 1400   BILITOT 0.30 08/16/2015 1554   GFRNONAA 25 (L) 12/01/2016 1400   GFRNONAA 27 (L) 07/20/2015 0949   GFRAA 29 (L) 12/01/2016 1400   GFRAA 31 (L) 07/20/2015 0949    No results found for: SPEP, UPEP  Lab Results  Component Value Date   WBC 5.6 12/18/2016   NEUTROABS 4.4 12/18/2016   HGB 7.2 (L) 12/18/2016   HCT 23.2 (L) 12/18/2016   MCV 71.1 (L) 12/18/2016   PLT 318 12/18/2016      Chemistry      Component Value Date/Time   NA 136 12/01/2016 1400   NA CANCELED 07/01/2016 1434   NA 140 08/16/2015 1554   K 4.1 12/01/2016 1400   K 3.8 08/16/2015 1554   CL 102 12/01/2016 1400   CO2 23 12/01/2016 1400    CO2 21 (L) 08/16/2015 1554   BUN 36 (H) 12/01/2016 1400   BUN CANCELED 07/01/2016 1434   BUN 51.0 (H) 08/16/2015 1554   CREATININE 2.52 (H) 12/01/2016 1400   CREATININE 3.64 (H) 01/09/2016 1606   CREATININE 2.7 (H) 08/16/2015 1554      Component Value Date/Time   CALCIUM 8.4 (L) 12/01/2016 1400  CALCIUM NOPLAS 10/24/2015 1415   CALCIUM 8.9 08/16/2015 1554   ALKPHOS 89 12/01/2016 1400   ALKPHOS 89 08/16/2015 1554   AST 10 (L) 12/01/2016 1400   AST 17 08/16/2015 1554   ALT 7 (L) 12/01/2016 1400   ALT 13 08/16/2015 1554   BILITOT 0.3 12/01/2016 1400   BILITOT 0.30 08/16/2015 1554       RADIOGRAPHIC STUDIES: I have personally reviewed the radiological images as listed and agreed with the findings in the report. Dg Ankle Complete Left  Result Date: 12/01/2016 CLINICAL DATA:  66 year old diabetic presenting with swelling and discoloration to the left lower leg with pain in the left foot and ankle. Personal history of DVT. Current history of gout. EXAM: LEFT ANKLE COMPLETE - 3+ VIEW COMPARISON:  None. FINDINGS: Diffuse soft tissue swelling/edema. No evidence of acute or subacute fracture or dislocation. Ankle mortise intact with well-preserved joint space. Modeling of the talus with widening of the subtalar joint and possible lucencies within the medial talus and the talar dome. Well-preserved bone mineral density. Arterial calcification involving the tibioperoneal arteries, the plantar arteries and the dorsalis pedis artery. IMPRESSION: 1. 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 2. No acute osseous abnormalities otherwise. Electronically Signed   By: Evangeline Dakin M.D.   On: 12/01/2016 13:08   Mr Foot Left Wo Contrast  Result Date: 12/02/2016 CLINICAL DATA:  Diabetic neuropathy with increasing left ankle foot pain and swelling. Patient also history of gout. EXAM: MRI OF THE LEFT FOOT WITHOUT  CONTRAST TECHNIQUE: Multiplanar, multisequence MR imaging of the left foot was performed. No intravenous contrast was administered. COMPARISON:  MRI 09/21/2016 FINDINGS: Examination is somewhat limited by patient motion and lack of IV contrast. When compared the prior study from August most of the changes are relatively similar. The tibiotalar and subtalar joint effusions are slightly larger and there may be more fluid and worsening tendinopathy involving the peroneal tendons. Diffuse marrow signal abnormality involving the ankle, hindfoot and midfoot appears relatively stable. There is stable diffuse myositis but I do not see any severe cellulitis or focal soft tissue abscess. I think findings are most likely due to gout involving the ankle and subtalar joints and diabetic neuropathy involving the midfoot. Certainly could not totally exclude the possibility of infection but recommend clinical exclusion. IMPRESSION: Relatively stable to slightly progressive findings from the MRI of August 2018. I think the findings are most likely due to a combination of gout involving the tibiotalar joint and subtalar joints and diabetic neuropathy involving the midfoot. Osteomyelitis cannot be totally excluded based on the imaging findings but I think it is less likely. This could be excluded clinically. Electronically Signed   By: Marijo Sanes M.D.   On: 12/02/2016 15:37   Dg Foot Complete Left  Result Date: 12/01/2016 CLINICAL DATA:  Left leg swelling and discoloration. EXAM: LEFT FOOT - COMPLETE 3+ VIEW COMPARISON:  Radiographs of September 21, 2016. FINDINGS: Vascular calcifications are noted. Lytic destruction is seen involving the proximal base of the first metatarsal concerning for osteomyelitis. There also appears to be lytic destruction involving the superior and posterior aspect of the calcaneus. No definite fracture is noted. Joint spaces are intact. IMPRESSION: Lytic destruction is seen involving the proximal base  of the first metatarsal as well as the superior and posterior portion of the calcaneus concerning for osteomyelitis. MRI may be performed further evaluation. Electronically Signed   By: Marijo Conception, M.D.  On: 12/01/2016 13:08    ASSESSMENT & PLAN:  Anemia due to stage 4 chronic kidney disease treated with darbepoetin [facility]) The patient has significant recurrent anemia Previously, we discussed performing a bone marrow aspirate and biopsy but the patient missed the appointment due to recurrent hospitalization The patient is currently dealing with recurrent gout and nonhealing wound in his left lower extremity His sedimentation rate is very high I felt that it is not wise to pursue a bone marrow biopsy right now He will continue darbepoetin injection, intravenous iron infusion as needed and blood transfusion as needed Unfortunately, due to timing issue, I am not able to give him blood transfusion today I will schedule II units of blood transfusion on Saturday He will return here on a weekly basis for close blood, monitoring and transfusion support as needed We discussed some of the risks, benefits, and alternatives of blood transfusions. The patient is symptomatic from anemia and the hemoglobin level is critically low.  Some of the side-effects to be expected including risks of transfusion reactions, chills, infection, syndrome of volume overload and risk of hospitalization from various reasons and the patient is willing to proceed and went ahead to sign consent today.   Iron deficiency anemia He has recurrent iron deficiency anemia He has missed several appointment for intravenous iron infusion He will come here on a weekly basis for blood count check If he does not need blood transfusion, we will give him IV iron instead   Orders Placed This Encounter  Procedures  . Iron and TIBC    Standing Status:   Future    Standing Expiration Date:   01/23/2018  . Ferritin    Standing Status:    Future    Standing Expiration Date:   01/23/2018   All questions were answered. The patient knows to call the clinic with any problems, questions or concerns. No barriers to learning was detected. I spent 20 minutes counseling the patient face to face. The total time spent in the appointment was 25 minutes and more than 50% was on counseling and review of test results     Heath Lark, MD 12/19/2016 9:42 AM

## 2016-12-19 NOTE — Assessment & Plan Note (Signed)
He has recurrent iron deficiency anemia He has missed several appointment for intravenous iron infusion He will come here on a weekly basis for blood count check If he does not need blood transfusion, we will give him IV iron instead

## 2016-12-19 NOTE — Patient Instructions (Signed)

## 2016-12-19 NOTE — Assessment & Plan Note (Signed)
The patient has significant recurrent anemia Previously, we discussed performing a bone marrow aspirate and biopsy but the patient missed the appointment due to recurrent hospitalization The patient is currently dealing with recurrent gout and nonhealing wound in his left lower extremity His sedimentation rate is very high I felt that it is not wise to pursue a bone marrow biopsy right now He will continue darbepoetin injection, intravenous iron infusion as needed and blood transfusion as needed Unfortunately, due to timing issue, I am not able to give him blood transfusion today I will schedule II units of blood transfusion on Saturday He will return here on a weekly basis for close blood, monitoring and transfusion support as needed We discussed some of the risks, benefits, and alternatives of blood transfusions. The patient is symptomatic from anemia and the hemoglobin level is critically low.  Some of the side-effects to be expected including risks of transfusion reactions, chills, infection, syndrome of volume overload and risk of hospitalization from various reasons and the patient is willing to proceed and went ahead to sign consent today.

## 2016-12-19 NOTE — Progress Notes (Signed)
[facility] [place]   CC: Follow up peripheral artery occlusive disease  History of Present Illness Leonard [name] is a 66 y.o. male patient of Dr. Bridgett Larsson who is s/p orbital atherectomy of Right superficial femoral artery x 2, angioplasty of Right superficial femoral artery x 2 (3 mm x 60 mm, 4 mm x 60 mm) on 01-13-13 by Dr. Bridgett Larsson, and open amputation R 4th and 5th toe by Dr. 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 He is also s/p left upper arm brachiocephalic AV fistula creation on 02-11-16 by Dr. Bridgett Larsson for hemodialysis use to address his ESRD, pt states his nephrologist indicated to him that he may need to start HD soon.   He is not yet on dialysis.  Pt denies any steal symptoms in his left upper extremity.   He returns today for PAOD evaluation.   When Dr. Bridgett Larsson evaluated pt on 11-28-15, his assessment was RLE critical limb ischemia s/p 4th and 5th toe amputation, s/p R SFA OA+PTA, R open amp 4th and 5th toes. Pt's ABI clearly demonstrated occlusion of R SFA. The patient was asx at that time, likely due to limited ambulation. Dr. Bridgett Larsson encouraged the walking plan. Based on the patient's vascular studies and examination, Dr. Bridgett Larsson offered the patient: q6 month ABI.  He states that he walks 8 minutes 3x/week. He states his left calf is painful only after walking up an incline; his right leg sometimes bothers him with walking.  He denies any known history of stroke or TIA.   He states he has gout in left foot and it is painful now.    Pt Diabetic: Yes, A1C was 11.0 on 10-10-16, uncontrolled Pt smoker: former smoker, quit in January 2015 when he had an MI, started smoking in 1969  Pt meds include: Statin :Yes Betablocker: Yes ASA: Yes     Past Medical History:  Diagnosis Date  . Cancer ([place])    skin cancer-  . CHF (congestive heart failure) ([place])   . Chronic kidney disease    not on dialysis  . Coronary artery disease   .  Gangrene of toe ([place])    right 5th/notes 01/04/2013   . High cholesterol   . Hypertension   . Iron deficiency anemia 08/03/2015  . 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 PAD (peripheral artery disease) ([place])   . Shortness of breath dyspnea    with exertion  . Type II diabetes mellitus (HCC)    Type II    Social History Social History   Tobacco Use  . Smoking status: Former Smoker    Packs/day: 1.00    Years: 46.00    Pack years: 46.00    Types: Cigarettes    Start date: 02/04/1967    Last attempt to quit: 02/03/2013    Years since quitting: 3.8  . Smokeless tobacco: Never Used  . Tobacco comment: Doing well with quitting.  Substance Use Topics  . Alcohol use: No    Alcohol/week: 0.0 oz  . Drug use: No    Family History Family History  Problem Relation Age of Onset  . Cancer Mother        lung cancer  . Hypertension Mother   . Cancer Sister        patient thinks it was uterine cancer  . Hypertension Sister   . Heart attack Sister   . Stroke Neg Hx     Past Surgical History:  Procedure Laterality  Date  . ABDOMINAL ANGIOGRAM  01/06/2013   Performed by Conrad Whiteface, MD at [facility] CATH LAB  . ANGIOPLASTY / STENTING FEMORAL Right 01/13/2013   superficial femoral artery x 2 (3 mm x 60 mm, 4 mm x 60 mm)  . COLONOSCOPY W/ POLYPECTOMY    . CORONARY ARTERY BYPASS GRAFTING (CABG) N/A 04/07/2013   Performed by Ivin Poot, MD at [facility]  . INTRAOPERATIVE TRANSESOPHAGEAL ECHOCARDIOGRAM N/A 04/07/2013   Performed by Ivin Poot, MD at [facility]  . LEFT HEART CATHETERIZATION WITH CORONARY ANGIOGRAM N/A 04/05/2013   Performed by Martinique, Peter M, MD at [facility] CATH LAB  . LEFT UPPER ARM BRACHIAL CEPHALIC ARTERIOVENOUS (AV) FISTULA CREATION Left 02/11/2016   Performed by Conrad [place], MD at [place] Right 01/13/2013   Performed by Conrad [place], MD at [facility] CATH LAB  . LOWER EXTREMITY ANGIOGRAM Bilateral 01/06/2013   Performed by Conrad Edgemere, MD at [facility] CATH LAB  . RAY AMPUTATION RIGHT  4th & 5th TOE Right 01/28/2013   Performed by Rosetta Posner, MD at [facility]  . SKIN CANCER EXCISION  2017  . THROAT SURGERY  1983   polyps  . TONSILLECTOMY AND ADENOIDECTOMY  ~ 1965    No Known Allergies  Current Outpatient Medications  Medication Sig Dispense Refill  . allopurinol (ZYLOPRIM) 100 MG tablet Take 1 tablet (100 mg total) by mouth 2 (two) times daily. 60 tablet 0  . amLODipine (NORVASC) 10 MG tablet Take 1 tablet (10 mg total) by mouth daily. 30 tablet 6  . aspirin 81 MG chewable tablet Chew 81 mg by mouth daily.    Marland Kitchen atorvastatin (LIPITOR) 40 MG tablet TAKE ONE (1) TABLET BY MOUTH EVERY DAY AT 6:00PM (Patient taking differently: Take 40 mg by mouth once a day at 6 PM) 90 tablet 3  . Blood Glucose Monitoring Suppl (ONETOUCH VERIO FLEX SYSTEM) w/Device KIT 1 kit by Does not apply route daily. 1 kit 3  . carvedilol (COREG) 25 MG tablet Take 2 tablets (50 mg total) by mouth 2 (two) times daily with a meal. 120 tablet 3  . cloNIDine (CATAPRES) 0.2 MG tablet TAKE TWO (2) TABLETS BY MOUTH 2 TIMES DAILY (Patient taking differently: Take 0.4 mg by mouth two times a day) 120 tablet 3  . colchicine 0.6 MG tablet Take 1 tablet (0.6 mg total) 2 (two) times daily by mouth. 30 tablet 0  . Darbepoetin Alfa (ARANESP) 500 MCG/ML SOSY injection Inject 500 mcg into the skin every 14 (fourteen) days.     Marland Kitchen doxazosin (CARDURA) 8 MG tablet Take 1 tablet (8 mg total) by mouth every evening.    . ferrous sulfate 325 (65 FE) MG tablet TAKE ONE (1) TABLET BY MOUTH TWO (2) TIMES DAILY WITH A MEAL (Patient taking differently: Take 325 mg by mouth two times a day with a meal) 60 tablet 3  . ferumoxytol 510 mg in sodium chloride 0.9 % 100 mL Inject into the vein every 7 (seven) days.     Marland Kitchen glucose blood (ONETOUCH VERIO) test strip Use as instructed to test three times daily. ICD-10 code: E11.22 100 each 12  . hydrALAZINE (APRESOLINE) 100 MG tablet TAKE ONE  TABLET BY MOUTH EVERY EIGHT HOURS (Patient taking differently: Take 100 mg by mouth every 8 hours) 90 tablet 1  . insulin aspart (NOVOLOG FLEXPEN) 100 UNIT/ML FlexPen Inject 15 Units 2 (two) times daily before lunch  and supper into the skin. 15 mL 5  . Insulin Glargine (LANTUS SOLOSTAR) 100 UNIT/ML Solostar Pen Inject 50 Units into the skin every morning. (Patient taking differently: Inject 50 Units into the skin daily after breakfast. ) 15 mL 3  . isosorbide mononitrate (IMDUR) 60 MG 24 hr tablet Take 60 mg by mouth daily.    Marland Kitchen levothyroxine (SYNTHROID, LEVOTHROID) 50 MCG tablet Take 50 mcg by mouth daily before breakfast.    . nitroGLYCERIN (NITROSTAT) 0.4 MG SL tablet Place 1 tablet (0.4 mg total) under the tongue every 5 (five) minutes as needed for chest pain. 30 tablet 12  . ONETOUCH DELICA LANCETS 65K MISC 1 each by Does not apply route 3 (three) times daily. ICD-10 code: E11.22 100 each 12  . pantoprazole (PROTONIX) 40 MG tablet TAKE ONE (1) TABLET BY MOUTH EVERY DAY (Patient taking differently: Take 40 mg by mouth once a day) 90 tablet 0  . potassium chloride SA (K-DUR,KLOR-CON) 20 MEQ tablet Take 20 mEq by mouth daily.    Marland Kitchen torsemide (DEMADEX) 20 MG tablet Take 3 tablets (60 mg total) by mouth daily. May take an extra 20 mg Daily for weight gain above 212 lbs (Patient taking differently: Take 20 mg by mouth 3 (three) times daily. And may take an extra 20 mg daily for weight gain above 212 lbs) 90 tablet 6  . traMADol (ULTRAM) 50 MG tablet Take 1 tablet (50 mg total) every 8 (eight) hours as needed by mouth (for pain). 30 tablet 0  . warfarin (COUMADIN) 5 MG tablet Take 2.5-5 mg by mouth See admin instructions. 2.5 mg in the evening on Sun/Mon/Tues/Wed/Thurs/Sat and 5 mg on Fri    . enoxaparin (LOVENOX) 100 MG/ML injection Inject 0.9 mLs (90 mg total) into the skin daily. Please inject ONLY 0.9 mLs (32m total) on 12/04/16. Do NOT use whole syringe 0.9 mL 0   No current facility-administered  medications for this visit.     ROS: See HPI for pertinent positives and negatives.   Physical Examination  Vitals:   12/19/16 1434  BP: 127/68  Pulse: 73  Resp: 18  Temp: (!) 97.2 F (36.2 C)  TempSrc: Oral  SpO2: 100%  Weight: 194 lb 1.6 oz (88 kg)   Body mass index is 29.51 kg/m.  General: Sleepy &Ox 3,  male in NAD, ambulating with cane, limping, favoring left foot  Pulmonary: Sym exp, respirations are non labored, good B air movt,CTA B  Cardiac: RRR, Nl S1, S2, no detected murmur  Vascular: Vessel Right Left  Radial Palpable Palpable  Brachial Palpable Palpable  Carotid Palpable, No Bruit Palpable, No Bruit  Aorta Not palpable N/A  Femoral Palpable Palpable  Popliteal Not palpable Not palpable  PT Not palpable Not palpable  DP Not palpable Not palpable   Gastrointestinal: soft, non-distended, non-tender to palpation, No guarding or rebound, no HSM, no palpable masses, no CVAT B, No palpable prominent aortic pulsedue to pannus   Musculoskeletal: M/S 4/5 throughout , Extremities without ischemic changes, healed R 4th and 5th toe amp,  non pitting and pitting edema in bilateral lower legs and feet: trace in right LE, 2-3+ in left foot.  Neurologic: CN 2-12 intact, Pain and light touch intact in extremities, Motor exam as listed above. Falling asleep on the exam table.      ASSESSMENT: Leonard MATSUOis a 66y.o. male who is s/p orbital atherectomy of Right superficial femoral artery x 2, angioplasty of Right superficial femoral artery  x 2 (3 mm x 60 mm, 4 mm x 60 mm) on 01-13-13 by Dr. Bridgett Larsson, and open amputation R 4th and 5th toe by Dr. 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 He is also s/p left upper arm brachiocephalic AV fistula creation on 02-11-16 by Dr. Bridgett Larsson for hemodialysis use to address his ESRD.  He is not yet on dialysis, but indicates he may need to start soon.  Pt denies any steal symptoms in his left upper  extremity.   He has moderate claudication in his left calf, no rest pain, no signs of ischemia in his feet or legs; no complaints re his right leg.  He does not seem to walk much lately.   I advised him to work closely with his primary care provider to help him get his DM under as good control as possible.  Fortunately he stopped smoking in January 2015 when he had an MI.   I discussed with Dr. Donzetta Matters the limited value of obtaining ABI's when his ankle vessels continue to be non compressible, no waveform morphology documented today, right TBI is 0.29, left is dampened. Pt has no gangrene, no ulcers or open wounds in his lower extremities. His left foot has 2-3+ non pitting edema, pt states this foot is painful with gout. See Plan.   DATA  ABI (Date: 12/19/2016):  R:   ABI:  0.37 PT and [place] DP ([place] on 06-13-16),   PT: waveform morphology not documented  DP: waveform morphology not documented  TBI:  0.29  L:   ABI: 0.88 PT, [place] DP (was 0.90 PT),   PT: waveform morphology not documented  DP: waveform morphology not documented  TBI: dampened waveform   Unreliable ABI's bilaterally due to non compressible vessels. No waveform morphology documented.   (06-13-16): ABI: Right: [place] DP, 0.37 PT(was 1.11 on 11-28-15 with monophasic waveforms); TBI: 0.31 (was 0.31 on 11-28-15) Left: 0.90 PT (was 0.84 on 11-28-15), [place] DP (was 1.51 on 11-28-15); waveforms: monophasic; TBI: absent (was 0.20) Unreliable ABI's bilaterally; all monophasic waveforms; stable TBI on the right, seems to be worse on the left.    PLAN:  Based on the patient's vascular studies and examination, and after discussing with Dr. Donzetta Matters, pt will return to clinic in 1 year with ABI's.  Daily seated leg exercises discussed and demonstrated.  I advised pt to notify us if he develops concerns re the circulation in his feet or legs.     I discussed in depth with the patient the nature of atherosclerosis, and emphasized the  importance of maximal medical management including strict control of blood pressure, blood glucose, and lipid levels, obtaining regular exercise, and continued cessation of smoking.  The patient is aware that without maximal medical management the underlying atherosclerotic disease process will progress, limiting the benefit of any interventions.  The patient was given information about PAD including signs, symptoms, treatment, what symptoms should prompt the patient to seek immediate medical care, and risk reduction measures to take.  Clemon Chambers, RN, MSN, FNP-C [facility] Phone: [phone]  Clinic MD: Donzetta Matters  12/19/16 3:07 PM

## 2016-12-20 ENCOUNTER — Ambulatory Visit: Payer: Medicare Other

## 2016-12-20 DIAGNOSIS — D631 Anemia in chronic kidney disease: Secondary | ICD-10-CM | POA: Diagnosis not present

## 2016-12-20 DIAGNOSIS — N184 Chronic kidney disease, stage 4 (severe): Principal | ICD-10-CM

## 2016-12-20 MED ORDER — ACETAMINOPHEN 325 MG PO TABS
650.0000 mg | ORAL_TABLET | Freq: Once | ORAL | Status: AC
  Administered 2016-12-20: 650 mg via ORAL

## 2016-12-20 MED ORDER — ACETAMINOPHEN 325 MG PO TABS
ORAL_TABLET | ORAL | Status: AC
  Filled 2016-12-20: qty 2

## 2016-12-20 MED ORDER — SODIUM CHLORIDE 0.9 % IV SOLN
250.0000 mL | Freq: Once | INTRAVENOUS | Status: AC
  Administered 2016-12-20: 250 mL via INTRAVENOUS

## 2016-12-20 MED ORDER — DIPHENHYDRAMINE HCL 25 MG PO CAPS
ORAL_CAPSULE | ORAL | Status: AC
  Filled 2016-12-20: qty 1

## 2016-12-20 MED ORDER — DIPHENHYDRAMINE HCL 25 MG PO CAPS
25.0000 mg | ORAL_CAPSULE | Freq: Once | ORAL | Status: AC
  Administered 2016-12-20: 25 mg via ORAL

## 2016-12-20 NOTE — Patient Instructions (Signed)

## 2016-12-20 NOTE — Addendum Note (Signed)
Addended by: Joelene Millin A on: 12/20/2016 08:25 AM   Modules accepted: Orders

## 2016-12-22 LAB — TYPE AND SCREEN
ABO/RH(D): B NEG
Antibody Screen: NEGATIVE
UNIT DIVISION: 0
Unit division: 0

## 2016-12-22 LAB — BPAM RBC
BLOOD PRODUCT EXPIRATION DATE: 201811232359
Blood Product Expiration Date: 201811282359
ISSUE DATE / TIME: 201811170815
ISSUE DATE / TIME: 201811170815
UNIT TYPE AND RH: 1700
Unit Type and Rh: 1700

## 2016-12-22 NOTE — Addendum Note (Signed)
Addended by: Lianne Cure A on: 12/22/2016 04:35 PM   Modules accepted: Orders

## 2016-12-23 ENCOUNTER — Other Ambulatory Visit: Payer: Self-pay | Admitting: Family Medicine

## 2016-12-23 ENCOUNTER — Other Ambulatory Visit (HOSPITAL_COMMUNITY): Payer: Self-pay

## 2016-12-23 ENCOUNTER — Encounter (HOSPITAL_COMMUNITY): Payer: Self-pay

## 2016-12-23 ENCOUNTER — Other Ambulatory Visit (HOSPITAL_COMMUNITY): Payer: Self-pay | Admitting: Pharmacist

## 2016-12-23 ENCOUNTER — Ambulatory Visit (INDEPENDENT_AMBULATORY_CARE_PROVIDER_SITE_OTHER): Payer: Medicare Other | Admitting: Pharmacist

## 2016-12-23 DIAGNOSIS — Z7901 Long term (current) use of anticoagulants: Secondary | ICD-10-CM | POA: Diagnosis not present

## 2016-12-23 DIAGNOSIS — I82512 Chronic embolism and thrombosis of left femoral vein: Secondary | ICD-10-CM

## 2016-12-23 LAB — POCT INR: INR: 3.7

## 2016-12-23 MED ORDER — WARFARIN SODIUM 5 MG PO TABS: ORAL_TABLET | ORAL | 1 refills | Status: DC

## 2016-12-23 NOTE — Progress Notes (Signed)
Paramedicine Encounter    Patient ID: Leonard [name], male    DOB: 09/09/1950, 66 y.o.   MRN: [id number]    Patient Care Team: Gambino, Christina M, MD as PCP - General (Family Medicine) Coladonato, Joseph, MD as Consulting Physician (Nephrology)  Patient Active Problem List   Diagnosis Date Noted  . Acute osteomyelitis of left talus (HCC)   . Idiopathic chronic gout of left foot without tophus   . Osteomyelitis (HCC) 12/01/2016  . Acute osteomyelitis of left foot (HCC)   . Left foot pain 10/29/2016  . Long term (current) use of anticoagulants [Z79.01] 09/09/2016  . Chronic deep vein thrombosis (DVT) of femoral vein of left lower extremity (HCC) 09/03/2016  . Morbid obesity due to excess calories (HCC) 03/17/2016  . 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) 02/28/2016  . Fatigue associated with anemia 01/21/2016  . Diabetic retinopathy (HCC) 11/13/2015  . CHF (congestive heart failure) (HCC) 08/27/2015  . Iron deficiency anemia 08/03/2015  . Claudication (HCC)   . Type 2 diabetes mellitus with stage 3 chronic kidney disease, with long-term current use of insulin (HCC)   . Hematuria 06/27/2015  . Skin lesion of face 02/22/2015  . 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 Deficiency anemia 01/15/2015  . CKD (chronic kidney disease) stage 4, GFR 15-29 ml/min (HCC) 01/15/2015  . Hypothyroidism 11/22/2014  . COPD GOLD III  02/16/2014  . History of tobacco use 08/23/2013  . 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 CAD (coronary artery disease) 04/06/2013  . PVD - hx of Rt SFA PTA and s/p Rt 4-5th toe amp Dec 2014 04/05/2013  . Essential hypertension 11/25/2012  . Mixed hyperlipidemia 07/17/2008    Current Outpatient Medications:  .  allopurinol (ZYLOPRIM) 100 MG tablet, Take 1 tablet (100 mg total) by mouth 2 (two) times daily., Disp: 60 tablet, Rfl: 0 .  amLODipine (NORVASC) 10 MG tablet, Take 1 tablet (10 mg total) by mouth daily.,  Disp: 30 tablet, Rfl: 6 .  aspirin 81 MG chewable tablet, Chew 81 mg by mouth daily., Disp: , Rfl:  .  atorvastatin (LIPITOR) 40 MG tablet, TAKE ONE (1) TABLET BY MOUTH EVERY DAY AT 6:00PM (Patient taking differently: Take 40 mg by mouth once a day at 6 PM), Disp: 90 tablet, Rfl: 3 .  carvedilol (COREG) 25 MG tablet, Take 2 tablets (50 mg total) by mouth 2 (two) times daily with a meal., Disp: 120 tablet, Rfl: 3 .  cloNIDine (CATAPRES) 0.2 MG tablet, TAKE TWO (2) TABLETS BY MOUTH 2 TIMES DAILY (Patient taking differently: Take 0.4 mg by mouth two times a day), Disp: 120 tablet, Rfl: 3 .  colchicine 0.6 MG tablet, Take 1 tablet (0.6 mg total) 2 (two) times daily by mouth., Disp: 30 tablet, Rfl: 0 .  Blood Glucose Monitoring Suppl (ONETOUCH VERIO FLEX SYSTEM) w/Device KIT, 1 kit by Does not apply route daily., Disp: 1 kit, Rfl: 3 .  Darbepoetin Alfa (ARANESP) 500 MCG/ML SOSY injection, Inject 500 mcg into the skin every 14 (fourteen) days. , Disp: , Rfl:  .  doxazosin (CARDURA) 8 MG tablet, Take 1 tablet (8 mg total) by mouth every evening., Disp: , Rfl:  .  enoxaparin (LOVENOX) 100 MG/ML injection, Inject 0.9 mLs (90 mg total) into the skin daily. Please inject ONLY 0.9 mLs (90mg total) on 12/04/16. Do NOT use whole syringe, Disp: 0.9 mL, Rfl: 0 .  ferrous sulfate 325 (65 FE) MG tablet,   TAKE ONE (1) TABLET BY MOUTH TWO (2) TIMES DAILY WITH A MEAL (Patient taking differently: Take 325 mg by mouth two times a day with a meal), Disp: 60 tablet, Rfl: 3 .  ferumoxytol 510 mg in sodium chloride 0.9 % 100 mL, Inject into the vein every 7 (seven) days. , Disp: , Rfl:  .  glucose blood (ONETOUCH VERIO) test strip, Use as instructed to test three times daily. ICD-10 code: E11.22, Disp: 100 each, Rfl: 12 .  hydrALAZINE (APRESOLINE) 100 MG tablet, TAKE ONE TABLET BY MOUTH EVERY EIGHT HOURS (Patient taking differently: Take 100 mg by mouth every 8 hours), Disp: 90 tablet, Rfl: 1 .  insulin aspart (NOVOLOG FLEXPEN)  100 UNIT/ML FlexPen, Inject 15 Units 2 (two) times daily before lunch and supper into the skin., Disp: 15 mL, Rfl: 5 .  Insulin Glargine (LANTUS SOLOSTAR) 100 UNIT/ML Solostar Pen, Inject 50 Units into the skin every morning. (Patient taking differently: Inject 50 Units into the skin daily after breakfast. ), Disp: 15 mL, Rfl: 3 .  isosorbide mononitrate (IMDUR) 60 MG 24 hr tablet, Take 60 mg by mouth daily., Disp: , Rfl:  .  levothyroxine (SYNTHROID, LEVOTHROID) 50 MCG tablet, Take 50 mcg by mouth daily before breakfast., Disp: , Rfl:  .  nitroGLYCERIN (NITROSTAT) 0.4 MG SL tablet, Place 1 tablet (0.4 mg total) under the tongue every 5 (five) minutes as needed for chest pain., Disp: 30 tablet, Rfl: 12 .  ONETOUCH DELICA LANCETS 33G MISC, 1 each by Does not apply route 3 (three) times daily. ICD-10 code: E11.22, Disp: 100 each, Rfl: 12 .  pantoprazole (PROTONIX) 40 MG tablet, TAKE ONE (1) TABLET BY MOUTH EVERY DAY (Patient taking differently: Take 40 mg by mouth once a day), Disp: 90 tablet, Rfl: 0 .  potassium chloride SA (K-DUR,KLOR-CON) 20 MEQ tablet, Take 20 mEq by mouth daily., Disp: , Rfl:  .  torsemide (DEMADEX) 20 MG tablet, Take 3 tablets (60 mg total) by mouth daily. May take an extra 20 mg Daily for weight gain above 212 lbs (Patient taking differently: Take 20 mg by mouth 3 (three) times daily. And may take an extra 20 mg daily for weight gain above 212 lbs), Disp: 90 tablet, Rfl: 6 .  traMADol (ULTRAM) 50 MG tablet, Take 1 tablet (50 mg total) every 8 (eight) hours as needed by mouth (for pain)., Disp: 30 tablet, Rfl: 0 .  warfarin (COUMADIN) 5 MG tablet, Take 2.5-5 mg by mouth See admin instructions. 2.5 mg in the evening on Sun/Mon/Tues/Wed/Thurs/Sat and 5 mg on Fri, Disp: , Rfl:  No Known Allergies   Social History   Socioeconomic History  . Marital status: Single    Spouse name: Not on file  . Number of children: Not on file  . Years of education: Not on file  . Highest  education level: Not on file  Social Needs  . Financial resource strain: Not on file  . Food insecurity - worry: Not on file  . Food insecurity - inability: Not on file  . Transportation needs - medical: Not on file  . Transportation needs - non-medical: Not on file  Occupational History  . Not on file  Tobacco Use  . Smoking status: Former Smoker    Packs/day: 1.00    Years: 46.00    Pack years: 46.00    Types: Cigarettes    Start date: 02/04/1967    Last attempt to quit: 02/03/2013    Years since quitting: 3.8  .   Smokeless tobacco: Never Used  . Tobacco comment: Doing well with quitting.  Substance and Sexual Activity  . Alcohol use: No    Alcohol/week: 0.0 oz  . Drug use: No  . Sexual activity: Not Currently  Other Topics Concern  . Not on file  Social History Narrative   Lives with sister, [name] in [place].  Retired - former Sports coach.    Physical Exam  Pulmonary/Chest: No respiratory distress.  Abdominal: He exhibits no distension. There is no tenderness. There is no guarding.  Musculoskeletal: He exhibits edema.  Skin: Skin is warm and dry. He is not diaphoretic.        Future Appointments  Date Time Provider [place]  12/23/2016  1:45 PM [facility]  12/29/2016  2:30 PM [name], MD [facility]  01/02/2017 11:45 AM Heath Lark, MD [facility] None  01/02/2017  1:00 PM [facility] F[facility] None  01/06/2017  2:15 PM Newt Minion, MD [facility] None  02/04/2017 10:15 AM Gardiner Barefoot, DPM [facility]    ATF pt CAO x4 sitting on the chair in his living room c/o stomach issues. Pt stated that he has had diarrhea for a day or so.  Pt hasnt eaten much food due to his stomach issues.  Pt has taken all of his meds besides his afternoon doses of hydralazine.  Pt is about to run out of several meds and the potassium and warfarin was not delivered.  Pt's vitals noted.  Pt has an  appointment at the lab today to get his INR checked.    I went to get his meds and returned to finish box pill boxes.  Danae Chen was contacted about the warfarin refill which was out.  The warfarin will picked up tomorrow but the 1st pill box is completed.  BP 132/80 (BP Location: Right Arm, Patient Position: Sitting, Cuff Size: Normal)   Pulse 73   Resp 16   Wt 189 lb (85.7 kg)   SpO2 99%   BMI 28.74 kg/m    **rx called in: clonidine filled until mon 2nd box/none in tues  Weight yesterday-cant remember Last visit weight-191    Kameela Leipold, EMT Paramedic 12/23/2016    ACTION: Home visit completed

## 2016-12-23 NOTE — Patient Instructions (Signed)
Skip tonight's dose of warfarin, then continue taking 2.5 mg (1/2 tablet) daily except 5 mg(1 tablet) on Monday and Fridays. Recheck in 2 weeks. Call if placed on any new medications or if scheduled for any procedures or surgery [phone].

## 2016-12-24 DIAGNOSIS — D631 Anemia in chronic kidney disease: Secondary | ICD-10-CM | POA: Diagnosis not present

## 2016-12-24 DIAGNOSIS — I2510 Atherosclerotic heart disease of native coronary artery without angina pectoris: Secondary | ICD-10-CM | POA: Diagnosis not present

## 2016-12-24 DIAGNOSIS — F17201 Nicotine dependence, unspecified, in remission: Secondary | ICD-10-CM | POA: Diagnosis not present

## 2016-12-24 DIAGNOSIS — I129 Hypertensive chronic kidney disease with stage 1 through stage 4 chronic kidney disease, or unspecified chronic kidney disease: Secondary | ICD-10-CM | POA: Diagnosis not present

## 2016-12-24 DIAGNOSIS — N184 Chronic kidney disease, stage 4 (severe): Secondary | ICD-10-CM | POA: Diagnosis not present

## 2016-12-24 DIAGNOSIS — I739 Peripheral vascular disease, unspecified: Secondary | ICD-10-CM | POA: Diagnosis not present

## 2016-12-24 DIAGNOSIS — N2581 Secondary hyperparathyroidism of renal origin: Secondary | ICD-10-CM | POA: Diagnosis not present

## 2016-12-24 DIAGNOSIS — N179 Acute kidney failure, unspecified: Secondary | ICD-10-CM | POA: Diagnosis not present

## 2016-12-24 DIAGNOSIS — J449 Chronic obstructive pulmonary disease, unspecified: Secondary | ICD-10-CM | POA: Diagnosis not present

## 2016-12-24 DIAGNOSIS — Z6829 Body mass index (BMI) 29.0-29.9, adult: Secondary | ICD-10-CM | POA: Diagnosis not present

## 2016-12-24 DIAGNOSIS — E1122 Type 2 diabetes mellitus with diabetic chronic kidney disease: Secondary | ICD-10-CM | POA: Diagnosis not present

## 2016-12-24 DIAGNOSIS — I5030 Unspecified diastolic (congestive) heart failure: Secondary | ICD-10-CM | POA: Diagnosis not present

## 2016-12-29 ENCOUNTER — Encounter: Payer: Self-pay | Admitting: Family Medicine

## 2016-12-29 ENCOUNTER — Ambulatory Visit (INDEPENDENT_AMBULATORY_CARE_PROVIDER_SITE_OTHER): Payer: Medicare Other | Admitting: Family Medicine

## 2016-12-29 VITALS — BP 137/70 | HR 72 | Temp 97.9°F | Wt 201.2 lb

## 2016-12-29 DIAGNOSIS — E11628 Type 2 diabetes mellitus with other skin complications: Secondary | ICD-10-CM

## 2016-12-29 DIAGNOSIS — L84 Corns and callosities: Secondary | ICD-10-CM | POA: Diagnosis not present

## 2016-12-29 DIAGNOSIS — M1A0720 Idiopathic chronic gout, left ankle and foot, without tophus (tophi): Secondary | ICD-10-CM

## 2016-12-29 DIAGNOSIS — N189 Chronic kidney disease, unspecified: Secondary | ICD-10-CM

## 2016-12-29 DIAGNOSIS — I779 Disorder of arteries and arterioles, unspecified: Secondary | ICD-10-CM | POA: Diagnosis not present

## 2016-12-29 DIAGNOSIS — M109 Gout, unspecified: Secondary | ICD-10-CM

## 2016-12-29 DIAGNOSIS — E039 Hypothyroidism, unspecified: Secondary | ICD-10-CM | POA: Diagnosis not present

## 2016-12-29 DIAGNOSIS — D631 Anemia in chronic kidney disease: Secondary | ICD-10-CM | POA: Diagnosis not present

## 2016-12-29 DIAGNOSIS — D509 Iron deficiency anemia, unspecified: Secondary | ICD-10-CM | POA: Diagnosis not present

## 2016-12-29 HISTORY — DX: Type 2 diabetes mellitus with other skin complications: E11.628

## 2016-12-29 NOTE — Assessment & Plan Note (Addendum)
Iron deficiency anemia and anemia secondary to CKD.  He is following with oncology for weekly hemoglobin checks. Has missed appointments for iron infusions.  He has an appointment on December 4 for blood transfusion.  No dizziness, lightheadedness, or rectal bleeding.  He did have a positive FOBT and it was recommended for him to get a colonoscopy, will address this at next visit in 1 week.  Ideally his transfusion threshold would be anything less than 8 due to history of CAD -Check CBC today -Follow-up on December 4 for blood transfusion

## 2016-12-29 NOTE — Assessment & Plan Note (Addendum)
Patient continues to have left foot and ankle pain/swelling thought to be secondary to gout which he was diagnosed with at his hospitalization last month.  He is taking colchicine and allopurinol.  He is also taking tramadol for pain as needed.  He is reassuringly afebrile however his foot does look worse today than compared to the pictures when he was first admitted to the hospital last month. - Follow-up with Dr. Sharol Given on December 4 - Continue allopurinol and colchicine - Continue tramadol as needed for pain - Continue compression stocking - Will check CBC, ESR, uric acid today

## 2016-12-29 NOTE — Progress Notes (Signed)
Subjective:    Patient ID: Leonard [name] , male   DOB: Jul 07, 1950 , 66 y.o..   MRN: [id number]  HPI  Leonard [name] is a 66 yo M with PMH of hyperlipidemia, HTN, CAD s/p CABG, tobacco abuse, CKD stage 4, uncontrolled T2DM, MGUS, and COPD here for  Chief Complaint  Patient presents with  . Hospitalization Follow-up    Follow up outpatient visit after Hospitalization  Leonard [name] is accompanied by sister Sources of clinical information for visit is/are patient and relative(s). The Discharge Summary for the hospitalization from 12/01/16 to 12/03/16 was reviewed.  Nursing assessment for this office visit was reviewed with the patient for accuracy and revision.   HPI Principle Diagnosis requiring hospitalization: Left foot and ankle swelling/pain  Brief Hospital course summary: Patient was admitted for left foot and ankle swelling and pain, it was thought that maybe he had a DVT however this was ruled out with Doppler ultrasound.  An x-ray revealed a lytic destructive area which was concerning for possible osteomyelitis.  MRI of foot was obtained and it was thought that he likely had instead a combination of gout and diabetic neuropathy.  Dr. due to was consulted on the hospital and recommended allopurinol and colchicine which he was discharged with.  He was also discharged with a fracture boot to immobilize the ankle. Additionally patient was anemic to 7.1 and received 1 unit of packed red blood cells.  Follow up instructions from patient's hospital healthcare providers:  1. Continue colchicine and allopurinol  2. Monitor Hgb, should remain >8. Check on day of hospital follow up  3. Monitor wound on right toe -  Wound care recommendations of painting the area with betadine swab daily and leaving it open to air  4. Follow up with Dr. Sharol Given as outpatient  5. Check INR 6. Patient was put on lovenox once a day to bridge with coumadin until therapeutic INR    ---------------------------------------------------------------------------------------------------------------------- Problems since hospital discharge: Patient states that his left foot and ankle pain and swelling is the exact same.  He has been taking the allopurinol and colchicine.  He notes that he needs to take tramadol at least once a day for pain.  He is walking with a rolling walker.  He denies any fevers, chills, nausea, vomiting, diarrhea, constipation, dizziness, lightheadedness, hematochezia, dark stools.  ---------------------------------------------------------------------------------------------------------------------- Follow up appointments with specialists:  Pending: Dr. Sharol Given orthopedics on December 4, oncology for blood transfusion on December 4, INR check on December 4  ---------------------------------------------------------------------------------------------------------------------- New medications started during hospitalization: allopurinol and colchicine Chronic medications stopped during hospitalization:  Patient's Medication List was updated in the EMR: yes --------------------------------------------------------------------------------------------------------------------- Home Health Services: None Durable Medical Equipment: Rolling walker ---------------------------------------------------------------------------------------------------------------------  Review of Systems: Per HPI.   Health Maintenance Due  Topic Date Due  . COLONOSCOPY  06/21/2001  . OPHTHALMOLOGY EXAM  02/04/2015  . PNA vac Low Risk Adult (1 of 2 - PCV13) 06/21/2016    Past Medical History: Patient Active Problem List   Diagnosis Date Noted  . 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 Idiopathic chronic gout of left foot without tophus   . Left foot pain 10/29/2016  . Long term (current) use of anticoagulants [Z79.01] 09/09/2016  . Chronic deep vein thrombosis  (DVT) of femoral vein of left lower extremity ([place]) 09/03/2016  . Morbid obesity due to excess calories ([place]) 03/17/2016  . Anemia due to stage 4 chronic kidney disease treated with darbepoetin ([place]) 02/28/2016  .  Fatigue associated with anemia 01/21/2016  . Diabetic retinopathy ([place]) 11/13/2015  . CHF (congestive heart failure) ([place]) 08/27/2015  . Iron deficiency anemia 08/03/2015  . Claudication ([place])   . Type 2 diabetes mellitus with stage 3 chronic kidney disease, with long-term current use of insulin ([place])   . Hematuria 06/27/2015  . Skin lesion of face 02/22/2015  . 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 Deficiency anemia 01/15/2015  . CKD (chronic kidney disease) stage 4, GFR 15-29 ml/min (HCC) 01/15/2015  . Hypothyroidism 11/22/2014  . COPD GOLD III  02/16/2014  . History of tobacco use 08/23/2013  . 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 CAD (coronary artery disease) 04/06/2013  . PVD - hx of Rt SFA PTA and s/p Rt 4-5th toe amp Dec 2014 04/05/2013  . Essential hypertension 11/25/2012  . Mixed hyperlipidemia 07/17/2008    Medications: reviewed and updated Current Outpatient Medications  Medication Sig Dispense Refill  . allopurinol (ZYLOPRIM) 100 MG tablet Take 1 tablet (100 mg total) by mouth 2 (two) times daily. 60 tablet 0  . amLODipine (NORVASC) 10 MG tablet Take 1 tablet (10 mg total) by mouth daily. 30 tablet 6  . aspirin 81 MG chewable tablet Chew 81 mg by mouth daily.    Marland Kitchen atorvastatin (LIPITOR) 40 MG tablet TAKE ONE (1) TABLET BY MOUTH EVERY DAY AT 6:00PM (Patient taking differently: Take 40 mg by mouth once a day at 6 PM) 90 tablet 3  . Blood Glucose Monitoring Suppl (ONETOUCH VERIO FLEX SYSTEM) w/Device KIT 1 kit by Does not apply route daily. 1 kit 3  . carvedilol (COREG) 25 MG tablet Take 2 tablets (50 mg total) by mouth 2 (two) times daily with a meal. 120 tablet 3  . cloNIDine (CATAPRES) 0.2 MG tablet TAKE TWO (2) TABLETS BY MOUTH 2 TIMES DAILY  (Patient taking differently: Take 0.4 mg by mouth two times a day) 120 tablet 3  . colchicine 0.6 MG tablet Take 1 tablet (0.6 mg total) 2 (two) times daily by mouth. 30 tablet 0  . Darbepoetin Alfa (ARANESP) 500 MCG/ML SOSY injection Inject 500 mcg into the skin every 14 (fourteen) days.     Marland Kitchen doxazosin (CARDURA) 8 MG tablet Take 1 tablet (8 mg total) by mouth every evening.    . enoxaparin (LOVENOX) 100 MG/ML injection Inject 0.9 mLs (90 mg total) into the skin daily. Please inject ONLY 0.9 mLs (46m total) on 12/04/16. Do NOT use whole syringe 0.9 mL 0  . ferrous sulfate 325 (65 FE) MG tablet TAKE ONE (1) TABLET BY MOUTH TWO (2) TIMES DAILY WITH A MEAL (Patient taking differently: Take 325 mg by mouth two times a day with a meal) 60 tablet 3  . ferumoxytol 510 mg in sodium chloride 0.9 % 100 mL Inject into the vein every 7 (seven) days.     .Marland Kitchenglucose blood (ONETOUCH VERIO) test strip Use as instructed to test three times daily. ICD-10 code: E11.22 100 each 12  . hydrALAZINE (APRESOLINE) 100 MG tablet TAKE ONE TABLET BY MOUTH EVERY EIGHT HOURS (Patient taking differently: Take 100 mg by mouth every 8 hours) 90 tablet 1  . insulin aspart (NOVOLOG FLEXPEN) 100 UNIT/ML FlexPen Inject 15 Units 2 (two) times daily before lunch and supper into the skin. 15 mL 5  . Insulin Glargine (LANTUS SOLOSTAR) 100 UNIT/ML Solostar Pen Inject 50 Units into the skin every morning. (Patient taking differently: Inject 50 Units into the skin daily after breakfast. )  15 mL 3  . isosorbide mononitrate (IMDUR) 60 MG 24 hr tablet Take 60 mg by mouth daily.    Marland Kitchen levothyroxine (SYNTHROID, LEVOTHROID) 50 MCG tablet TAKE ONE (1) TABLET BY MOUTH EVERY DAY BEFORE BREAKFAST 30 tablet 3  . nitroGLYCERIN (NITROSTAT) 0.4 MG SL tablet Place 1 tablet (0.4 mg total) under the tongue every 5 (five) minutes as needed for chest pain. 30 tablet 12  . ONETOUCH DELICA LANCETS 62B MISC 1 each by Does not apply route 3 (three) times daily. ICD-10  code: E11.22 100 each 12  . pantoprazole (PROTONIX) 40 MG tablet TAKE ONE (1) TABLET BY MOUTH EVERY DAY (Patient taking differently: Take 40 mg by mouth once a day) 90 tablet 0  . potassium chloride SA (K-DUR,KLOR-CON) 20 MEQ tablet Take 20 mEq by mouth daily.    Marland Kitchen torsemide (DEMADEX) 20 MG tablet Take 3 tablets (60 mg total) by mouth daily. May take an extra 20 mg Daily for weight gain above 212 lbs (Patient taking differently: Take 20 mg by mouth 3 (three) times daily. And may take an extra 20 mg daily for weight gain above 212 lbs) 90 tablet 6  . traMADol (ULTRAM) 50 MG tablet Take 1 tablet (50 mg total) every 8 (eight) hours as needed by mouth (for pain). 30 tablet 0  . warfarin (COUMADIN) 5 MG tablet Take 2.5 mg (1/2 tablet) daily except 5 mg (1 tablet) on Monday and Friday 18 tablet 1   No current facility-administered medications for this visit.     Social Hx:  reports that he quit smoking about 3 years ago. His smoking use included cigarettes. He started smoking about 49 years ago. He has a 46.00 pack-year smoking history. he has never used smokeless tobacco.   Objective:   BP 137/70 (BP Location: Right Arm, Patient Position: Sitting, Cuff Size: Normal)   Pulse 72   Temp 97.9 F (36.6 C) (Oral)   Wt 201 lb 3.2 oz (91.3 kg)   SpO2 98%   BMI 30.59 kg/m  Physical Exam  Gen: NAD, alert, cooperative with exam, well-appearing HEENT: NCAT, PERRL, clear conjunctiva, oropharynx clear, supple neck Cardiac: Regular rate and rhythm, normal S1/S2, no murmur, no edema in RLE, 2+ pitting edema in LLE up to knee, capillary refill brisk, faint DP pulses bilaterally Respiratory: Clear to auscultation bilaterally, no wheezes, non-labored breathing Skin: Left 2nd digit on foot has 34m abrasion, Right foot has a callus under the 1st and 2nd digit, no surrounding erythema or induration MSK: Left foot has diffuse swelling and hyper pigmented skin changes.  Left ankle showing swelling and tenderness to  the lateral malleolus upon palpation, range of motion normal however it is painful dorsiflexion and plantar flexion, 5 out of 5 strength Neurological: no gross deficits.  Psych: good insight, normal mood and affect  Assessment & Plan:  Idiopathic chronic gout of left foot without tophus Patient continues to have left foot and ankle pain/swelling thought to be secondary to gout which he was diagnosed with at his hospitalization last month.  He is taking colchicine and allopurinol.  He is also taking tramadol for pain as needed.  He is reassuringly afebrile however his foot does look worse today than compared to the pictures when he was first admitted to the hospital last month. - Follow-up with Dr. DSharol Givenon December 4 - Continue allopurinol and colchicine - Continue tramadol as needed for pain - Continue compression stocking - Will check CBC, ESR, uric acid today  Iron  deficiency anemia Iron deficiency anemia and anemia secondary to CKD.  He is following with oncology for weekly hemoglobin checks. Has missed appointments for iron infusions.  He has an appointment on December 4 for blood transfusion.  No dizziness, lightheadedness, or rectal bleeding.  He did have a positive FOBT and it was recommended for him to get a colonoscopy, will address this at next visit in 1 week.  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  He has poorly controlled diabetes with a last A1c of 11 in September.  Callus was scraped by Dr. Andria Frames and no underlying infection was noted.  Patient does follow-up with podiatry and notes that his next appointment is in a couple months.  Suggested that patient be seen sooner by podiatry -Patient's sister will try and call to get an  earlier appointment to be seen within the next couple weeks with podiatry -May need to consider home health wound care if lesion progresses   Orders Placed This Encounter  Procedures  . CBC with Differential  . Sedimentation Rate  . TSH  . Uric Acid   Patient with multiple co morbidities/medical issues and sees several specialists.  He will need a more intensive visit at our clinic and for this reason he has been scheduled for a 1 hour thorough visit with [place] on December 3 at 4 PM.  Smitty Cords, MD [place], PGY-3

## 2016-12-29 NOTE — Assessment & Plan Note (Addendum)
Pressure callus noted on the plantar aspect of right foot beneath the first and second digits.  He has poorly controlled diabetes with a last A1c of 11 in September.  Callus was scraped by Dr. Andria Frames and no underlying infection was noted.  Patient does follow-up with podiatry and notes that his next appointment is in a couple months.  Suggested that patient be seen sooner by podiatry -Patient's sister will try and call to get an earlier appointment to be seen within the next couple weeks with podiatry -May need to consider home health wound care if lesion progresses

## 2016-12-29 NOTE — Patient Instructions (Signed)
Thank you for coming in today, it was so nice to see you! Today we talked about:    Today we are doing blood work to check your anemia, check your white blood cells, your thyroid, and uric acid  Please follow up with Dr. Sharol Given on 12/4  Follow up with the podiatrist within the next couple weeks.   Continue tramadol as needed for your pain  We are not making any adjustments to your medications today  No starbursts or sweets :)   Please follow up in 12/3 at 4pm.   If we ordered any tests today, you will be notified via telephone of any abnormalities. If everything is normal you will get a letter in the mail.   If you have any questions or concerns, please do not hesitate to call the office at [phone]. You can also message me directly via MyChart.   Sincerely,  Smitty Cords, MD

## 2016-12-30 ENCOUNTER — Other Ambulatory Visit (HOSPITAL_COMMUNITY): Payer: Self-pay

## 2016-12-30 LAB — CBC WITH DIFFERENTIAL/PLATELET
BASOS: 0 %
Basophils Absolute: 0 10*3/uL (ref 0.0–0.2)
EOS (ABSOLUTE): 0.2 10*3/uL (ref 0.0–0.4)
EOS: 3 %
HEMATOCRIT: 26.7 % — AB (ref 37.5–51.0)
HEMOGLOBIN: 8 g/dL — AB (ref 13.0–17.7)
Immature Grans (Abs): 0 10*3/uL (ref 0.0–0.1)
Immature Granulocytes: 0 %
LYMPHS ABS: 1 10*3/uL (ref 0.7–3.1)
Lymphs: 14 %
MCH: 22.6 pg — ABNORMAL LOW (ref 26.6–33.0)
MCHC: 30 g/dL — AB (ref 31.5–35.7)
MCV: 75 fL — AB (ref 79–97)
MONOCYTES: 7 %
Monocytes Absolute: 0.5 10*3/uL (ref 0.1–0.9)
Neutrophils Absolute: 5.4 10*3/uL (ref 1.4–7.0)
Neutrophils: 76 %
Platelets: 250 10*3/uL (ref 150–379)
RBC: 3.54 x10E6/uL — AB (ref 4.14–5.80)
RDW: 20.6 % — ABNORMAL HIGH (ref 12.3–15.4)
WBC: 7.1 10*3/uL (ref 3.4–10.8)

## 2016-12-30 LAB — URIC ACID: URIC ACID: 5.3 mg/dL (ref 3.7–8.6)

## 2016-12-30 LAB — SEDIMENTATION RATE: Sed Rate: 117 mm/hr — ABNORMAL HIGH (ref 0–30)

## 2016-12-30 LAB — TSH: TSH: 9.71 u[IU]/mL — AB (ref 0.450–4.500)

## 2016-12-30 NOTE — Progress Notes (Signed)
Paramedicine Encounter    Patient ID: Leonard [name], male    DOB: 06/24/1950, 66 y.o.   MRN: [id number]   Patient Care Team: [name], MD as PCP - General (Family Medicine) [name], MD as Consulting Physician (Nephrology)  Patient Active Problem List   Diagnosis Date Noted  . 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 Idiopathic chronic gout of left foot without tophus   . Left foot pain 10/29/2016  . Long term (current) use of anticoagulants [Z79.01] 09/09/2016  . Chronic deep vein thrombosis (DVT) of femoral vein of left lower extremity ([place]) 09/03/2016  . Morbid obesity due to excess calories ([place]) 03/17/2016  . Anemia due to stage 4 chronic kidney disease treated with darbepoetin ([place]) 02/28/2016  . Fatigue associated with anemia 01/21/2016  . Diabetic retinopathy ([place]) 11/13/2015  . CHF (congestive heart failure) ([place]) 08/27/2015  . Iron deficiency anemia 08/03/2015  . Claudication ([place])   . Type 2 diabetes mellitus with stage 3 chronic kidney disease, with long-term current use of insulin ([place])   . Hematuria 06/27/2015  . Skin lesion of face 02/22/2015  . 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 Deficiency anemia 01/15/2015  . CKD (chronic kidney disease) stage 4, GFR 15-29 ml/min (HCC) 01/15/2015  . Hypothyroidism 11/22/2014  . COPD GOLD III  02/16/2014  . History of tobacco use 08/23/2013  . 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 CAD (coronary artery disease) 04/06/2013  . PVD - hx of Rt SFA PTA and s/p Rt 4-5th toe amp Dec 2014 04/05/2013  . Essential hypertension 11/25/2012  . Mixed hyperlipidemia 07/17/2008    Current Outpatient Medications:  .  allopurinol (ZYLOPRIM) 100 MG tablet, Take 1 tablet (100 mg total) by mouth 2 (two) times daily., Disp: 60 tablet, Rfl: 0 .  amLODipine (NORVASC) 10 MG tablet, Take 1 tablet (10 mg total) by mouth daily., Disp: 30 tablet, Rfl: 6 .  aspirin 81 MG chewable tablet,  Chew 81 mg by mouth daily., Disp: , Rfl:  .  atorvastatin (LIPITOR) 40 MG tablet, TAKE ONE (1) TABLET BY MOUTH EVERY DAY AT 6:00PM (Patient taking differently: Take 40 mg by mouth once a day at 6 PM), Disp: 90 tablet, Rfl: 3 .  Blood Glucose Monitoring Suppl (ONETOUCH VERIO FLEX SYSTEM) w/Device KIT, 1 kit by Does not apply route daily., Disp: 1 kit, Rfl: 3 .  carvedilol (COREG) 25 MG tablet, Take 2 tablets (50 mg total) by mouth 2 (two) times daily with a meal., Disp: 120 tablet, Rfl: 3 .  cloNIDine (CATAPRES) 0.2 MG tablet, TAKE TWO (2) TABLETS BY MOUTH 2 TIMES DAILY (Patient taking differently: Take 0.4 mg by mouth two times a day), Disp: 120 tablet, Rfl: 3 .  colchicine 0.6 MG tablet, Take 1 tablet (0.6 mg total) 2 (two) times daily by mouth., Disp: 30 tablet, Rfl: 0 .  Darbepoetin Alfa (ARANESP) 500 MCG/ML SOSY injection, Inject 500 mcg into the skin every 14 (fourteen) days. , Disp: , Rfl:  .  doxazosin (CARDURA) 8 MG tablet, Take 1 tablet (8 mg total) by mouth every evening., Disp: , Rfl:  .  ferrous sulfate 325 (65 FE) MG tablet, TAKE ONE (1) TABLET BY MOUTH TWO (2) TIMES DAILY WITH A MEAL (Patient taking differently: Take 325 mg by mouth two times a day with a meal), Disp: 60 tablet, Rfl: 3 .  ferumoxytol 510 mg in sodium chloride 0.9 % 100 mL, Inject into the vein  every 7 (seven) days. , Disp: , Rfl:  .  glucose blood (ONETOUCH VERIO) test strip, Use as instructed to test three times daily. ICD-10 code: E11.22, Disp: 100 each, Rfl: 12 .  hydrALAZINE (APRESOLINE) 100 MG tablet, TAKE ONE TABLET BY MOUTH EVERY EIGHT HOURS (Patient taking differently: Take 100 mg by mouth every 8 hours), Disp: 90 tablet, Rfl: 1 .  insulin aspart (NOVOLOG FLEXPEN) 100 UNIT/ML FlexPen, Inject 15 Units 2 (two) times daily before lunch and supper into the skin., Disp: 15 mL, Rfl: 5 .  Insulin Glargine (LANTUS SOLOSTAR) 100 UNIT/ML Solostar Pen, Inject 50 Units into the skin every morning. (Patient taking differently:  Inject 50 Units into the skin daily after breakfast. ), Disp: 15 mL, Rfl: 3 .  isosorbide mononitrate (IMDUR) 60 MG 24 hr tablet, Take 60 mg by mouth daily., Disp: , Rfl:  .  levothyroxine (SYNTHROID, LEVOTHROID) 50 MCG tablet, TAKE ONE (1) TABLET BY MOUTH EVERY DAY BEFORE BREAKFAST, Disp: 30 tablet, Rfl: 3 .  nitroGLYCERIN (NITROSTAT) 0.4 MG SL tablet, Place 1 tablet (0.4 mg total) under the tongue every 5 (five) minutes as needed for chest pain., Disp: 30 tablet, Rfl: 12 .  ONETOUCH DELICA LANCETS 88B MISC, 1 each by Does not apply route 3 (three) times daily. ICD-10 code: E11.22, Disp: 100 each, Rfl: 12 .  pantoprazole (PROTONIX) 40 MG tablet, TAKE ONE (1) TABLET BY MOUTH EVERY DAY (Patient taking differently: Take 40 mg by mouth once a day), Disp: 90 tablet, Rfl: 0 .  potassium chloride SA (K-DUR,KLOR-CON) 20 MEQ tablet, Take 20 mEq by mouth daily., Disp: , Rfl:  .  torsemide (DEMADEX) 20 MG tablet, Take 3 tablets (60 mg total) by mouth daily. May take an extra 20 mg Daily for weight gain above 212 lbs (Patient taking differently: Take 20 mg by mouth 3 (three) times daily. And may take an extra 20 mg daily for weight gain above 212 lbs), Disp: 90 tablet, Rfl: 6 .  traMADol (ULTRAM) 50 MG tablet, Take 1 tablet (50 mg total) every 8 (eight) hours as needed by mouth (for pain)., Disp: 30 tablet, Rfl: 0 .  warfarin (COUMADIN) 5 MG tablet, Take 2.5 mg (1/2 tablet) daily except 5 mg (1 tablet) on Monday and Friday, Disp: 18 tablet, Rfl: 1 .  enoxaparin (LOVENOX) 100 MG/ML injection, Inject 0.9 mLs (90 mg total) into the skin daily. Please inject ONLY 0.9 mLs (68m total) on 12/04/16. Do NOT use whole syringe, Disp: 0.9 mL, Rfl: 0 No Known Allergies   Social History   Socioeconomic History  . Marital status: Single    Spouse name: Not on file  . Number of children: Not on file  . Years of education: Not on file  . Highest education level: Not on file  Social Needs  . Financial resource strain: Not  on file  . Food insecurity - worry: Not on file  . Food insecurity - inability: Not on file  . Transportation needs - medical: Not on file  . Transportation needs - non-medical: Not on file  Occupational History  . Not on file  Tobacco Use  . Smoking status: Former Smoker    Packs/day: 1.00    Years: 46.00    Pack years: 46.00    Types: Cigarettes    Start date: 02/04/1967    Last attempt to quit: 02/03/2013    Years since quitting: 3.9  . Smokeless tobacco: Never Used  . Tobacco comment: Doing well with quitting.  Substance and Sexual Activity  . Alcohol use: No    Alcohol/week: 0.0 oz  . Drug use: No  . Sexual activity: Not Currently  Other Topics Concern  . Not on file  Social History Narrative   Lives with sister, [name] in [place].  Retired - former Sports coach.    Physical Exam      Future Appointments  Date Time Provider [place]  01/02/2017 11:45 AM Heath Lark, MD [facility] None  01/02/2017  1:00 PM [facility] F19 [facility] None  01/05/2017  4:00 PM [name], MD [facility]  01/06/2017  1:45 PM [facility]  01/06/2017  2:15 PM Newt Minion, MD [facility] None  02/04/2017 10:15 AM Gardiner Barefoot, DPM [facility]   BP (!) 170/70   Pulse 76   Resp 15   SpO2 98%  Weight yesterday-196 Last visit weight-189 [facility]-179  Pt states he isnt feeling too hot today, his bowels are loose-he states when he eats carbs/rice it upsets his stomach. He missed numerous doses of his meds.  Dee done 2 weeks of pill boxes for him since she is out of town this week so that doesn't need to be done this week. He did not take his meds this morn and have not weighed this morning. He reports his weight yesterday was fully dressed--there is a 7lb weight gain from last week--his left leg is very swollen, he went to doc yesterday and was told to come back next week--he states it is painful in some areas. He  denies any open sores/wounds to the left leg/foot. Pt denies any sob, no dizziness, no h/a.b/p elevated but he has not taken his meds this morn. He took them during our visit. Advised him to be sure to take his meds and to be sure to take the bedtime one on right day of week as that has the coumadin in it.     ACTION: Home visit completed  Marylouise Stacks, EMT-Paramedic 12/30/16

## 2016-12-31 ENCOUNTER — Telehealth: Payer: Self-pay | Admitting: Family Medicine

## 2016-12-31 ENCOUNTER — Other Ambulatory Visit: Payer: Self-pay | Admitting: Family Medicine

## 2016-12-31 MED ORDER — LEVOTHYROXINE SODIUM 75 MCG PO TABS
75.0000 ug | ORAL_TABLET | Freq: Every day | ORAL | 1 refills | Status: DC
Start: 2016-12-31 — End: 2017-07-05

## 2016-12-31 NOTE — Progress Notes (Signed)
Synthroid increased from 50 to 75 mcg daily. Will recheck TSH in 6 weeks.

## 2016-12-31 NOTE — Telephone Encounter (Signed)
Called patient to discuss lab results  1. Hgb low at 8 but better than before (7.2). He is scheduled to receive blood on 11/30.  2. TSH high at 9.7. Will need to increase synthroid dose. Will send in new prescription 3. Uric acid level normal 4. ESR high at 117  Will follow up with him on 12/3 for a 1 hour thorough visit.   Smitty Cords, MD [place], PGY-3

## 2017-01-02 ENCOUNTER — Other Ambulatory Visit (HOSPITAL_BASED_OUTPATIENT_CLINIC_OR_DEPARTMENT_OTHER): Payer: Medicare Other

## 2017-01-02 ENCOUNTER — Ambulatory Visit (HOSPITAL_BASED_OUTPATIENT_CLINIC_OR_DEPARTMENT_OTHER): Payer: Medicare Other

## 2017-01-02 ENCOUNTER — Ambulatory Visit (HOSPITAL_BASED_OUTPATIENT_CLINIC_OR_DEPARTMENT_OTHER): Payer: Medicare Other | Admitting: Hematology and Oncology

## 2017-01-02 ENCOUNTER — Other Ambulatory Visit: Payer: Self-pay | Admitting: Hematology and Oncology

## 2017-01-02 ENCOUNTER — Encounter: Payer: Self-pay | Admitting: Hematology and Oncology

## 2017-01-02 ENCOUNTER — Telehealth: Payer: Self-pay

## 2017-01-02 VITALS — BP 122/54 | HR 73 | Temp 98.5°F | Resp 18 | Ht 68.0 in | Wt 200.3 lb

## 2017-01-02 VITALS — BP 136/60 | HR 65 | Temp 98.5°F | Resp 16

## 2017-01-02 DIAGNOSIS — D631 Anemia in chronic kidney disease: Secondary | ICD-10-CM

## 2017-01-02 DIAGNOSIS — I509 Heart failure, unspecified: Secondary | ICD-10-CM

## 2017-01-02 DIAGNOSIS — D509 Iron deficiency anemia, unspecified: Secondary | ICD-10-CM

## 2017-01-02 DIAGNOSIS — N184 Chronic kidney disease, stage 4 (severe): Secondary | ICD-10-CM

## 2017-01-02 DIAGNOSIS — D472 Monoclonal gammopathy: Secondary | ICD-10-CM | POA: Diagnosis not present

## 2017-01-02 DIAGNOSIS — M79672 Pain in left foot: Secondary | ICD-10-CM | POA: Diagnosis not present

## 2017-01-02 DIAGNOSIS — N183 Chronic kidney disease, stage 3 unspecified: Secondary | ICD-10-CM

## 2017-01-02 DIAGNOSIS — Z992 Dependence on renal dialysis: Secondary | ICD-10-CM

## 2017-01-02 DIAGNOSIS — R609 Edema, unspecified: Secondary | ICD-10-CM | POA: Diagnosis not present

## 2017-01-02 DIAGNOSIS — M109 Gout, unspecified: Secondary | ICD-10-CM | POA: Diagnosis not present

## 2017-01-02 DIAGNOSIS — I5022 Chronic systolic (congestive) heart failure: Secondary | ICD-10-CM

## 2017-01-02 DIAGNOSIS — N186 End stage renal disease: Secondary | ICD-10-CM

## 2017-01-02 DIAGNOSIS — G8929 Other chronic pain: Secondary | ICD-10-CM | POA: Diagnosis not present

## 2017-01-02 LAB — CBC WITH DIFFERENTIAL/PLATELET
BASO%: 0.3 % (ref 0.0–2.0)
BASOS ABS: 0 10*3/uL (ref 0.0–0.1)
EOS%: 4.5 % (ref 0.0–7.0)
Eosinophils Absolute: 0.3 10*3/uL (ref 0.0–0.5)
HCT: 24.8 % — ABNORMAL LOW (ref 38.4–49.9)
HGB: 7.6 g/dL — ABNORMAL LOW (ref 13.0–17.1)
LYMPH%: 11.2 % — AB (ref 14.0–49.0)
MCH: 23.2 pg — AB (ref 27.2–33.4)
MCHC: 30.6 g/dL — AB (ref 32.0–36.0)
MCV: 75.8 fL — ABNORMAL LOW (ref 79.3–98.0)
MONO#: 0.6 10*3/uL (ref 0.1–0.9)
MONO%: 8 % (ref 0.0–14.0)
NEUT#: 5.3 10*3/uL (ref 1.5–6.5)
NEUT%: 76 % — AB (ref 39.0–75.0)
PLATELETS: 186 10*3/uL (ref 140–400)
RBC: 3.27 10*6/uL — AB (ref 4.20–5.82)
RDW: 20.9 % — ABNORMAL HIGH (ref 11.0–14.6)
WBC: 7 10*3/uL (ref 4.0–10.3)
lymph#: 0.8 10*3/uL — ABNORMAL LOW (ref 0.9–3.3)

## 2017-01-02 LAB — PREPARE RBC (CROSSMATCH)

## 2017-01-02 LAB — IRON AND TIBC
%SAT: 10 % — ABNORMAL LOW (ref 20–55)
Iron: 23 ug/dL — ABNORMAL LOW (ref 42–163)
TIBC: 223 ug/dL (ref 202–409)
UIBC: 199 ug/dL (ref 117–376)

## 2017-01-02 LAB — FERRITIN: Ferritin: 426 ng/ml — ABNORMAL HIGH (ref 22–316)

## 2017-01-02 MED ORDER — SODIUM CHLORIDE 0.9 % IV SOLN
250.0000 mL | Freq: Once | INTRAVENOUS | Status: AC
  Administered 2017-01-02: 250 mL via INTRAVENOUS

## 2017-01-02 MED ORDER — FERUMOXYTOL INJECTION 510 MG/17 ML
510.0000 mg | Freq: Once | INTRAVENOUS | Status: AC
  Administered 2017-01-02: 510 mg via INTRAVENOUS
  Filled 2017-01-02: qty 17

## 2017-01-02 MED ORDER — DARBEPOETIN ALFA 500 MCG/ML IJ SOSY
500.0000 ug | PREFILLED_SYRINGE | Freq: Once | INTRAMUSCULAR | Status: DC
  Filled 2017-01-02: qty 1

## 2017-01-02 MED ORDER — ACETAMINOPHEN 325 MG PO TABS
650.0000 mg | ORAL_TABLET | Freq: Once | ORAL | Status: AC
Start: 2017-01-02 — End: 2017-01-02
  Administered 2017-01-02: 650 mg via ORAL

## 2017-01-02 MED ORDER — DIPHENHYDRAMINE HCL 25 MG PO CAPS
25.0000 mg | ORAL_CAPSULE | Freq: Once | ORAL | Status: AC
  Administered 2017-01-02: 25 mg via ORAL

## 2017-01-02 MED ORDER — ACETAMINOPHEN 325 MG PO TABS
ORAL_TABLET | ORAL | Status: AC
  Filled 2017-01-02: qty 2

## 2017-01-02 MED ORDER — DIPHENHYDRAMINE HCL 25 MG PO CAPS
ORAL_CAPSULE | ORAL | Status: AC
  Filled 2017-01-02: qty 1

## 2017-01-02 MED ORDER — SODIUM CHLORIDE 0.9% FLUSH
10.0000 mL | INTRAVENOUS | Status: AC | PRN
  Filled 2017-01-02: qty 10

## 2017-01-02 NOTE — Assessment & Plan Note (Signed)
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 In the meantime, he will continue to come back here minimum twice a month for close blood count monitoring, ESA and transfusion as needed He will continue close nephrology follow-up

## 2017-01-02 NOTE — Assessment & Plan Note (Signed)
The patient has intermittent congestive heart failure could be precipitated by severe anemia I will continue transfusion to keep hemoglobin greater than 8 He will continue medical management He is on chronic anticoagulation therapy as directed

## 2017-01-02 NOTE — Assessment & Plan Note (Signed)
The patient has significant recurrent anemia Previously, we discussed performing a bone marrow aspirate and biopsy but the patient missed the appointment due to recurrent hospitalization The patient is currently dealing with recurrent gout and nonhealing wound in his left lower extremity His sedimentation rate is very high I felt that it is not wise to pursue a bone marrow biopsy right now He will continue darbepoetin injection, intravenous iron infusion as needed and blood transfusion as needed He will return here on every 2 weeks basis for close blood, monitoring and transfusion support as needed We discussed some of the risks, benefits, and alternatives of blood transfusions. The patient is symptomatic from anemia and the hemoglobin level is critically low.  Some of the side-effects to be expected including risks of transfusion reactions, chills, infection, syndrome of volume overload and risk of hospitalization from various reasons and the patient is willing to proceed and went ahead to sign consent today. He will receive darbepoetin injection and 1 unit of blood transfusion today

## 2017-01-02 NOTE — Assessment & Plan Note (Signed)
The patient was originally referred here because of detectable M spike in his blood which subsequently become undetectable. 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 Extensive workup in the past few months did not take up any abnormal M spike. Although his serum light chains were high, this is likely related to renal disease and the ratio was just barely above the upper limit of normal. Repeat myeloma panel in July again is consistent with the pattern related to chronic renal failure In any case, I do not believe his renal failure and anemia is related to multiple myeloma. I plan to repeat the myeloma panel again next month

## 2017-01-02 NOTE — Patient Instructions (Signed)
Blood Transfusion, Care After This sheet gives you information about how to care for yourself after your procedure. Your doctor may also give you more specific instructions. If you have problems or questions, contact your doctor. Follow these instructions at home:  Take over-the-counter and prescription medicines only as told by your doctor.  Go back to your normal activities as told by your doctor.  Follow instructions from your doctor about how to take care of the area where an IV tube was put into your vein (insertion site). Make sure you: ? Wash your hands with soap and water before you change your bandage (dressing). If there is no soap and water, use hand sanitizer. ? Change your bandage as told by your doctor.  Check your IV insertion site every day for signs of infection. Check for: ? More redness, swelling, or pain. ? More fluid or blood. ? Warmth. ? Pus or a bad smell. Contact a doctor if:  You have more redness, swelling, or pain around the IV insertion site..  You have more fluid or blood coming from the IV insertion site.  Your IV insertion site feels warm to the touch.  You have pus or a bad smell coming from the IV insertion site.  Your pee (urine) turns pink, red, or brown.  You feel weak after doing your normal activities. Get help right away if:  You have signs of a serious allergic or body defense (immune) system reaction, including: ? Itchiness. ? Hives. ? Trouble breathing. ? Anxiety. ? Pain in your chest or lower back. ? Fever, flushing, and chills. ? Fast pulse. ? Rash. ? Watery poop (diarrhea). ? Throwing up (vomiting). ? Dark pee. ? Serious headache. ? Dizziness. ? Stiff neck. ? Yellow color in your face or the white parts of your eyes (jaundice). Summary  After a blood transfusion, return to your normal activities as told by your doctor.  Every day, check for signs of infection where the IV tube was put into your vein.  Some signs of  infection are warm skin, more redness and pain, more fluid or blood, and pus or a bad smell where the needle went in.  Contact your doctor if you feel weak or have any unusual symptoms. This information is not intended to replace advice given to you by your health care provider. Make sure you discuss any questions you have with your health care provider. Document Released: 02/10/2014 Document Revised: 09/14/2015 Document Reviewed: 09/14/2015 Elsevier Interactive Patient Education  2017 [place] injection What is this medicine? FERUMOXYTOL is an iron complex. Iron is used to make healthy red blood cells, which carry oxygen and nutrients throughout the body. This medicine is used to treat iron deficiency anemia in people with chronic kidney disease. This medicine may be used for other purposes; ask your health care provider or pharmacist if you have questions. COMMON BRAND NAME(S): Feraheme What should I tell my health care provider before I take this medicine? They need to know if you have any of these conditions: -anemia not caused by low iron levels -high levels of iron in the blood -magnetic resonance imaging (MRI) test scheduled -an unusual or allergic reaction to iron, other medicines, foods, dyes, or preservatives -pregnant or trying to get pregnant -breast-feeding How should I use this medicine? This medicine is for injection into a vein. It is given by a health care professional in a hospital or clinic setting. Talk to your pediatrician regarding the use of this  medicine in children. Special care may be needed. Overdosage: If you think you have taken too much of this medicine contact a poison control center or emergency room at once. NOTE: This medicine is only for you. Do not share this medicine with others. What if I miss a dose? It is important not to miss your dose. Call your doctor or health care professional if you are unable to keep an appointment. What  may interact with this medicine? This medicine may interact with the following medications: -other iron products This list may not describe all possible interactions. Give your health care provider a list of all the medicines, herbs, non-prescription drugs, or dietary supplements you use. Also tell them if you smoke, drink alcohol, or use illegal drugs. Some items may interact with your medicine. What should I watch for while using this medicine? Visit your doctor or healthcare professional regularly. Tell your doctor or healthcare professional if your symptoms do not start to get better or if they get worse. You may need blood work done while you are taking this medicine. You may need to follow a special diet. Talk to your doctor. Foods that contain iron include: whole grains/cereals, dried fruits, beans, or peas, leafy green vegetables, and organ meats (liver, kidney). What side effects may I notice from receiving this medicine? Side effects that you should report to your doctor or health care professional as soon as possible: -allergic reactions like skin rash, itching or hives, swelling of the face, lips, or tongue -breathing problems -changes in blood pressure -feeling faint or lightheaded, falls -fever or chills -flushing, sweating, or hot feelings -swelling of the ankles or feet Side effects that usually do not require medical attention (report to your doctor or health care professional if they continue or are bothersome): -diarrhea -headache -nausea, vomiting -stomach pain This list may not describe all possible side effects. Call your doctor for medical advice about side effects. You may report side effects to FDA at 1-800-FDA-1088. Where should I keep my medicine? This drug is given in a hospital or clinic and will not be stored at home. NOTE: This sheet is a summary. It may not cover all possible information. If you have questions about this medicine, talk to your doctor,  pharmacist, or health care provider.  2018 Elsevier/Gold Standard (2015-02-22 12:41:49)

## 2017-01-02 NOTE — Progress Notes (Unsigned)
Patient handed nurse a form to call transportation for Medicaid. Nurse called and spoke with Ronalee Belts at 1540 who said he could be here in "35 minutes". Patient made aware even though he was done, he could wait here in infusion room where he was resting and was given a new blanket, drink and sandwich. He understood what he was waiting for. Nurse kept the transportation slip in her possession. After caring for other patients, nurse went back to see if patient needed anything else at approximately 1805 and patient was not in chair and his walker and all belongings were gone. This nurse checked all bathrooms in infusion, hallway to lobby and lobby bathrooms. I also checked outside by [place], subway and walked to [employer].  Called our supervisor M. Moon and security and security checked the main hallways closest in [place] and all floors of cancer center.  Ronalee Belts, his ride arrived and was calling family and patient but no answer - this was around 47. I made several attempts to call the cell # provided and no answer. Patient was alert and oriented when I met him today at 1700 and verbalized what we were waiting for.

## 2017-01-02 NOTE — Progress Notes (Signed)
[facility] OFFICE PROGRESS NOTE  [name], MD SUMMARY OF HEMATOLOGIC HISTORY:  Leonard [name] is here because of progressive anemia, renal failure and MGUS. I reviewed reports from his nephrologist office. The patient have significant peripheral vascular disease, diabetes and hypertension. Early this year, he had baseline creatinine of around 1.3 and subsequently developed heart failure. He was placed on aggressive medication/regimen and subsequently was noted to have progressive renal failure and anemia. According to his blood work dated 01/03/2015, white blood cell count is at 6.6, hemoglobin 7.1, platelet count of 153, creatinine of 2.4, calcium of 8.9, MCV 76, iron saturation 15%, ferritin 180, serum vitamin B-12 of 360. Serum protein electrophoresis initially detected M spike but subsequently not quantifiable. Urine immunofixation showed Bence-Jones Proteinuria. Echocardiogram show preserved ejection fraction 55-60% but with evidence of diastolic heart failure. He denies history of abnormal bone pain or bone fracture. Patient denies history of recurrent infection or atypical infections such as shingles of meningitis. Denies chills, night sweats, anorexia or abnormal weight loss. He complained of fatigue and shortness of breath on minimal exertion. The patient denies any recent signs or symptoms of bleeding such as spontaneous hematuria or hematochezia. He has occasional epistaxis when he blows his nose. He does not have spontaneous epistaxis Evaluation in December 2016 show he had anemia chronic disease and MGUS  On 02/01/2015, he is started on Aranesp, 200 g every 2 weeks to keep hemoglobin greater than 11 Between December to June 2017, the dose of Aranesp is titrated to maximum 500 g From February 2018 till present, he received intermittent intravenous iron infusion along with several units of blood transfusion  INTERVAL HISTORY: Leonard [name] 66 y.o.  male returns for further follow-up. He has chronic nonhealing wound on his left foot. He continues close follow-up with orthopedic surgery The patient also have congestive heart failure with chronic bilateral lower extremity edema He is monitored closely by nephrologist for chronic renal disease. He has chronic left foot pain, stable. The patient denies any recent signs or symptoms of bleeding such as spontaneous epistaxis, hematuria or hematochezia.   I have reviewed the past medical history, past surgical history, social history and family history with the patient and they are unchanged from previous note.  ALLERGIES:  has No Known Allergies.  MEDICATIONS:  Current Outpatient Medications  Medication Sig Dispense Refill  . allopurinol (ZYLOPRIM) 100 MG tablet Take 1 tablet (100 mg total) by mouth 2 (two) times daily. 60 tablet 0  . amLODipine (NORVASC) 10 MG tablet Take 1 tablet (10 mg total) by mouth daily. 30 tablet 6  . aspirin 81 MG chewable tablet Chew 81 mg by mouth daily.    Marland Kitchen atorvastatin (LIPITOR) 40 MG tablet TAKE ONE (1) TABLET BY MOUTH EVERY DAY AT 6:00PM (Patient taking differently: Take 40 mg by mouth once a day at 6 PM) 90 tablet 3  . Blood Glucose Monitoring Suppl (ONETOUCH VERIO FLEX SYSTEM) w/Device KIT 1 kit by Does not apply route daily. 1 kit 3  . carvedilol (COREG) 25 MG tablet Take 2 tablets (50 mg total) by mouth 2 (two) times daily with a meal. 120 tablet 3  . cloNIDine (CATAPRES) 0.2 MG tablet TAKE TWO (2) TABLETS BY MOUTH 2 TIMES DAILY (Patient taking differently: Take 0.4 mg by mouth two times a day) 120 tablet 3  . colchicine 0.6 MG tablet Take 1 tablet (0.6 mg total) 2 (two) times daily by mouth. 30 tablet 0  . Darbepoetin  Alfa (ARANESP) 500 MCG/ML SOSY injection Inject 500 mcg into the skin every 14 (fourteen) days.     Marland Kitchen doxazosin (CARDURA) 8 MG tablet Take 1 tablet (8 mg total) by mouth every evening.    . enoxaparin (LOVENOX) 100 MG/ML injection Inject 0.9  mLs (90 mg total) into the skin daily. Please inject ONLY 0.9 mLs (51m total) on 12/04/16. Do NOT use whole syringe 0.9 mL 0  . ferrous sulfate 325 (65 FE) MG tablet TAKE ONE (1) TABLET BY MOUTH TWO (2) TIMES DAILY WITH A MEAL (Patient taking differently: Take 325 mg by mouth two times a day with a meal) 60 tablet 3  . ferumoxytol 510 mg in sodium chloride 0.9 % 100 mL Inject into the vein every 7 (seven) days.     .Marland Kitchenglucose blood (ONETOUCH VERIO) test strip Use as instructed to test three times daily. ICD-10 code: E11.22 100 each 12  . hydrALAZINE (APRESOLINE) 100 MG tablet TAKE ONE TABLET BY MOUTH EVERY EIGHT HOURS (Patient taking differently: Take 100 mg by mouth every 8 hours) 90 tablet 1  . insulin aspart (NOVOLOG FLEXPEN) 100 UNIT/ML FlexPen Inject 15 Units 2 (two) times daily before lunch and supper into the skin. 15 mL 5  . Insulin Glargine (LANTUS SOLOSTAR) 100 UNIT/ML Solostar Pen Inject 50 Units into the skin every morning. (Patient taking differently: Inject 50 Units into the skin daily after breakfast. ) 15 mL 3  . isosorbide mononitrate (IMDUR) 60 MG 24 hr tablet Take 60 mg by mouth daily.    .Marland Kitchenlevothyroxine (SYNTHROID, LEVOTHROID) 75 MCG tablet Take 1 tablet (75 mcg total) by mouth daily. 90 tablet 1  . nitroGLYCERIN (NITROSTAT) 0.4 MG SL tablet Place 1 tablet (0.4 mg total) under the tongue every 5 (five) minutes as needed for chest pain. 30 tablet 12  . ONETOUCH DELICA LANCETS 337JMISC 1 each by Does not apply route 3 (three) times daily. ICD-10 code: E11.22 100 each 12  . pantoprazole (PROTONIX) 40 MG tablet TAKE ONE (1) TABLET BY MOUTH EVERY DAY (Patient taking differently: Take 40 mg by mouth once a day) 90 tablet 0  . potassium chloride SA (K-DUR,KLOR-CON) 20 MEQ tablet Take 20 mEq by mouth daily.    .Marland Kitchentorsemide (DEMADEX) 20 MG tablet Take 3 tablets (60 mg total) by mouth daily. May take an extra 20 mg Daily for weight gain above 212 lbs (Patient taking differently: Take 20 mg by  mouth 3 (three) times daily. And may take an extra 20 mg daily for weight gain above 212 lbs) 90 tablet 6  . traMADol (ULTRAM) 50 MG tablet Take 1 tablet (50 mg total) every 8 (eight) hours as needed by mouth (for pain). 30 tablet 0  . warfarin (COUMADIN) 5 MG tablet Take 2.5 mg (1/2 tablet) daily except 5 mg (1 tablet) on Monday and Friday 18 tablet 1   No current facility-administered medications for this visit.    Facility-Administered Medications Ordered in Other Visits  Medication Dose Route Frequency Provider Last Rate Last Dose  . Darbepoetin Alfa (ARANESP) injection 500 mcg  500 mcg Subcutaneous Once Kemarion Abbey, MD      . sodium chloride flush (NS) 0.9 % injection 10 mL  10 mL Intracatheter PRN GAlvy Bimler Vennela Jutte, MD         REVIEW OF SYSTEMS:   Constitutional: Denies fevers, chills or night sweats Eyes: Denies blurriness of vision Ears, nose, mouth, throat, and face: Denies mucositis or sore throat Respiratory: Denies cough,  dyspnea or wheezes Cardiovascular: Denies palpitation, chest discomfort or lower extremity swelling Gastrointestinal:  Denies nausea, heartburn or change in bowel habits Skin: Denies abnormal skin rashes Lymphatics: Denies new lymphadenopathy or easy bruising Neurological:Denies numbness, tingling or new weaknesses Behavioral/Psych: Mood is stable, no new changes  All other systems were reviewed with the patient and are negative.  PHYSICAL EXAMINATION: ECOG PERFORMANCE STATUS: 2 - Symptomatic, <50% confined to bed  Vitals:   01/02/17 1218  BP: (!) 122/54  Pulse: 73  Resp: 18  Temp: 98.5 F (36.9 C)  SpO2: 99%   Filed Weights   01/02/17 1218  Weight: 200 lb 4.8 oz (90.9 kg)    GENERAL:alert, no distress and comfortable SKIN: skin color, texture, turgor are normal, no rashes or significant lesions EYES: normal, Conjunctiva are pink and non-injected, sclera clear OROPHARYNX:no exudate, no erythema and lips, buccal mucosa, and tongue normal  NECK:  supple, thyroid normal size, non-tender, without nodularity LYMPH:  no palpable lymphadenopathy in the cervical, axillary or inguinal LUNGS: clear to auscultation and percussion with normal breathing effort HEART: regular rate & rhythm and no murmurs with moderate bilateral lower extremity edema ABDOMEN:abdomen soft, non-tender and normal bowel sounds Musculoskeletal:no cyanosis of digits and no clubbing  NEURO: alert & oriented x 3 with fluent speech, no focal motor/sensory deficits  LABORATORY DATA:  I have reviewed the data as listed     Component Value Date/Time   NA 136 12/01/2016 1400   NA CANCELED 07/01/2016 1434   NA 140 08/16/2015 1554   K 4.1 12/01/2016 1400   K 3.8 08/16/2015 1554   CL 102 12/01/2016 1400   CO2 23 12/01/2016 1400   CO2 21 (L) 08/16/2015 1554   GLUCOSE 144 (H) 12/01/2016 1400   GLUCOSE 133 08/16/2015 1554   BUN 36 (H) 12/01/2016 1400   BUN CANCELED 07/01/2016 1434   BUN 51.0 (H) 08/16/2015 1554   CREATININE 2.52 (H) 12/01/2016 1400   CREATININE 3.64 (H) 01/09/2016 1606   CREATININE 2.7 (H) 08/16/2015 1554   CALCIUM 8.4 (L) 12/01/2016 1400   CALCIUM NOPLAS 10/24/2015 1415   CALCIUM 8.9 08/16/2015 1554   PROT 6.8 12/01/2016 1400   PROT 7.0 08/16/2015 1554   ALBUMIN 2.5 (L) 12/01/2016 1400   ALBUMIN 3.4 (L) 08/16/2015 1554   AST 10 (L) 12/01/2016 1400   AST 17 08/16/2015 1554   ALT 7 (L) 12/01/2016 1400   ALT 13 08/16/2015 1554   ALKPHOS 89 12/01/2016 1400   ALKPHOS 89 08/16/2015 1554   BILITOT 0.3 12/01/2016 1400   BILITOT 0.30 08/16/2015 1554   GFRNONAA 25 (L) 12/01/2016 1400   GFRNONAA 27 (L) 07/20/2015 0949   GFRAA 29 (L) 12/01/2016 1400   GFRAA 31 (L) 07/20/2015 0949    No results found for: SPEP, UPEP  Lab Results  Component Value Date   WBC 7.0 01/02/2017   NEUTROABS 5.3 01/02/2017   HGB 7.6 (L) 01/02/2017   HCT 24.8 (L) 01/02/2017   MCV 75.8 (L) 01/02/2017   PLT 186 01/02/2017      Chemistry      Component Value Date/Time    NA 136 12/01/2016 1400   NA CANCELED 07/01/2016 1434   NA 140 08/16/2015 1554   K 4.1 12/01/2016 1400   K 3.8 08/16/2015 1554   CL 102 12/01/2016 1400   CO2 23 12/01/2016 1400   CO2 21 (L) 08/16/2015 1554   BUN 36 (H) 12/01/2016 1400   BUN CANCELED 07/01/2016 1434   BUN 51.0 (  H) 08/16/2015 1554   CREATININE 2.52 (H) 12/01/2016 1400   CREATININE 3.64 (H) 01/09/2016 1606   CREATININE 2.7 (H) 08/16/2015 1554      Component Value Date/Time   CALCIUM 8.4 (L) 12/01/2016 1400   CALCIUM NOPLAS 10/24/2015 1415   CALCIUM 8.9 08/16/2015 1554   ALKPHOS 89 12/01/2016 1400   ALKPHOS 89 08/16/2015 1554   AST 10 (L) 12/01/2016 1400   AST 17 08/16/2015 1554   ALT 7 (L) 12/01/2016 1400   ALT 13 08/16/2015 1554   BILITOT 0.3 12/01/2016 1400   BILITOT 0.30 08/16/2015 1554       ASSESSMENT & PLAN:  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) The patient has significant recurrent anemia Previously, we discussed performing a bone marrow aspirate and biopsy but the patient missed the appointment due to recurrent hospitalization The patient is currently dealing with recurrent gout and nonhealing wound in his left lower extremity His sedimentation rate is very high I felt that it is not wise to pursue a bone marrow biopsy right now He will continue darbepoetin injection, intravenous iron infusion as needed and blood transfusion as needed He will return here on every 2 weeks basis for close blood, monitoring and transfusion support as needed We discussed some of the risks, benefits, and alternatives of blood transfusions. The patient is symptomatic from anemia and the hemoglobin level is critically low.  Some of the side-effects to be expected including risks of transfusion reactions, chills, infection, syndrome of volume overload and risk of hospitalization from various reasons and the patient is willing to proceed and went ahead to sign consent today. He will  receive darbepoetin injection and 1 unit of blood transfusion today  CHF (congestive heart failure) (HCC) The patient has intermittent congestive heart failure could be precipitated by severe anemia I will continue transfusion to keep hemoglobin greater than 8 He will continue medical management He is on chronic anticoagulation therapy as directed  CKD (chronic kidney disease) stage 4, GFR 15-29 ml/min (HCC) The patient has significant fluctuation of his serum creatinine, between chronic kidney disease stage III to stage IV. In the meantime, he will continue to come back here minimum twice a month for close blood count monitoring, ESA and transfusion as needed He will continue close nephrology follow-up  MGUS (monoclonal gammopathy of unknown significance) The patient was originally referred here because of detectable M spike in his blood which subsequently become undetectable. 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 Extensive workup in the past few months did not take up any abnormal M spike. Although his serum light chains were high, this is likely related to renal disease and the ratio was just barely above the upper limit of normal. Repeat myeloma panel in July again is consistent with the pattern related to chronic renal failure In any case, I do not believe his renal failure and anemia is related to multiple myeloma. I plan to repeat the myeloma panel again next month   Orders Placed This Encounter  Procedures  . Kappa/lambda light chains    Standing Status:   Future    Standing Expiration Date:   02/06/2018  . Multiple Myeloma Panel (SPEP&IFE w/QIG)    Standing Status:   Future    Standing Expiration Date:   02/06/2018    All questions were answered. The patient knows to call the clinic with any problems, questions or concerns. No barriers to learning was detected.  I spent 20 minutes  counseling the patient face to face. The total time spent in the  appointment was 25 minutes and more than 50% was on counseling.     Heath Lark, MD 11/30/20182:21 PM

## 2017-01-02 NOTE — Telephone Encounter (Signed)
Printed avs and calender for upcoming appointment. Per 11/30 los 

## 2017-01-03 LAB — BPAM RBC
BLOOD PRODUCT EXPIRATION DATE: 201812142359
ISSUE DATE / TIME: 201811301418
UNIT TYPE AND RH: 1700

## 2017-01-03 LAB — TYPE AND SCREEN
ABO/RH(D): B NEG
Antibody Screen: NEGATIVE
Unit division: 0

## 2017-01-05 ENCOUNTER — Ambulatory Visit (INDEPENDENT_AMBULATORY_CARE_PROVIDER_SITE_OTHER): Payer: Medicare Other | Admitting: Family Medicine

## 2017-01-05 ENCOUNTER — Encounter: Payer: Self-pay | Admitting: Family Medicine

## 2017-01-05 ENCOUNTER — Ambulatory Visit (HOSPITAL_COMMUNITY)
Admission: RE | Admit: 2017-01-05 | Discharge: 2017-01-05 | Disposition: A | Discharge status: Home / Self Care | Payer: Medicare Other | Source: Ambulatory Visit | Attending: Hematology and Oncology | Admitting: Hematology and Oncology

## 2017-01-05 VITALS — BP 146/60 | HR 85 | Temp 98.4°F | Ht 68.0 in | Wt 200.0 lb

## 2017-01-05 DIAGNOSIS — D649 Anemia, unspecified: Secondary | ICD-10-CM | POA: Insufficient documentation

## 2017-01-05 DIAGNOSIS — N183 Chronic kidney disease, stage 3 (moderate): Secondary | ICD-10-CM

## 2017-01-05 DIAGNOSIS — Z23 Encounter for immunization: Secondary | ICD-10-CM

## 2017-01-05 DIAGNOSIS — E038 Other specified hypothyroidism: Secondary | ICD-10-CM | POA: Diagnosis not present

## 2017-01-05 DIAGNOSIS — D472 Monoclonal gammopathy: Secondary | ICD-10-CM | POA: Insufficient documentation

## 2017-01-05 DIAGNOSIS — R5383 Other fatigue: Secondary | ICD-10-CM

## 2017-01-05 DIAGNOSIS — I779 Disorder of arteries and arterioles, unspecified: Secondary | ICD-10-CM | POA: Diagnosis not present

## 2017-01-05 DIAGNOSIS — E1122 Type 2 diabetes mellitus with diabetic chronic kidney disease: Secondary | ICD-10-CM

## 2017-01-05 DIAGNOSIS — Z794 Long term (current) use of insulin: Secondary | ICD-10-CM

## 2017-01-05 DIAGNOSIS — N184 Chronic kidney disease, stage 4 (severe): Secondary | ICD-10-CM | POA: Diagnosis not present

## 2017-01-05 DIAGNOSIS — I5032 Chronic diastolic (congestive) heart failure: Secondary | ICD-10-CM | POA: Diagnosis not present

## 2017-01-05 LAB — GLUCOSE, POCT (MANUAL RESULT ENTRY): POC GLUCOSE: 279 mg/dL — AB (ref 70–99)

## 2017-01-05 LAB — POCT GLYCOSYLATED HEMOGLOBIN (HGB A1C): Hemoglobin A1C: 6.9

## 2017-01-05 NOTE — Patient Instructions (Addendum)
Thank you for coming in today, it was so nice to see you! Today we talked about:    Due to you being very sleepy we are putting in a referral for a sleep study. Someone will call you to schedule this.   Diabetes:   We will check your A1C today and your sugar  Please continue to check your glucose at least once every morning  Your insulin regimen:  Every morning take 50 units of Lantus   Take 15 units of novolog before lunch  Take 15 units of novolog before supper  Please follow up in 1 month. You can schedule this appointment at the front desk before you leave or call the clinic.  Bring in all your medications or supplements to each appointment for review.   If we ordered any tests today, you will be notified via telephone of any abnormalities. If everything is normal you will get a letter in the mail.   If you have any questions or concerns, please do not hesitate to call the office at [phone]. You can also message me directly via MyChart.   Sincerely,  Smitty Cords, MD

## 2017-01-05 NOTE — Assessment & Plan Note (Signed)
History of long-standing uncontrolled diabetes due to medication noncompliance and poor diet.  His last A1c was 11 in September.  Today it remarkably read as 6.9.  Unsure of the accuracy of A1c given severe anemia and 1 unit blood transfusion that was given 4 days ago.  Point-of-care glucose was 279 today.  Discussed diabetes care with patient today who expressed good understanding of importance of glucose control.  Patient notes it is difficult for him to check his glucose and take his medication as prescribed because he falls asleep frequently on the couch while watching football.  The plan is as follows; -Encouraged daily glucose checks, especially in the morning -He is well educated on how to check his glucose and which numbers are too low for him -Continue Lantus 50 units every morning -Continue NovoLog 15 units with lunch and dinner -Follow-up in 1 month for diabetes

## 2017-01-05 NOTE — Assessment & Plan Note (Signed)
TSH was above normal and Synthroid was increased to 75 mcg last week.  Patient picked up the prescription but has not started taking his new dose yet.  Performed a thorough medication review today including looking at all individual pill bottles and ensuring that there instructions were correct.  Additionally combined medication bottles that were duplicates. -Start taking Synthroid 75 mg daily

## 2017-01-05 NOTE — Progress Notes (Signed)
Subjective:    Patient ID: Leonard [name] , male   DOB: [birth year] , 66 y.o..   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 Diabetes    1. Chronic Diabetes Disease Monitoring  Blood Sugar Ranges: Doesn't always check. Thinks in the mornings it is usually in the 170's. States he forgets to check his glucose and A1C because he falls asleep while watching football on TV Medication Compliance: no, does not take insulin consistently   Medication Side Effects  Hypoglycemia: no   Review of Systems: Per HPI.    Medications: reviewed and updated  Social Hx:  reports that he quit smoking about 3 years ago. His smoking use included cigarettes. He started smoking about 49 years ago. He has a 46.00 pack-year smoking history. he has never used smokeless tobacco.   Objective:   BP (!) 146/60   Pulse 85   Temp 98.4 F (36.9 C) (Oral)   Ht 5\' 8"  (1.727 m)   Wt 200 lb (90.7 kg)   SpO2 97%   BMI 30.41 kg/m  Physical Exam  Gen: NAD, alert, cooperative with exam, falls asleep intermittently throughout visit Resp: Normal work of breathing  Results for orders placed or performed in visit on 01/05/17  Glucose (CBG)  Result Value Ref Range   POC Glucose 279 (A) 70 - 99 mg/dl  HgB A1c  Result Value Ref Range   Hemoglobin A1C 6.9     Assessment & Plan:  Diabetes ([place]) History of long-standing uncontrolled diabetes due to medication noncompliance and poor diet.  His last A1c was 11 in September.  Today it remarkably read as 6.9.  Unsure of the accuracy of A1c given severe anemia and 1 unit blood transfusion that was given 4 days ago.  Point-of-care glucose was 279 today.  Discussed diabetes care with patient today who expressed good understanding of importance of glucose control.  Patient notes it is  difficult for him to check his glucose and take his medication as prescribed because he falls asleep frequently on the couch while watching football.  The plan is as follows; -Encouraged daily glucose checks, especially in the morning -He is well educated on how to check his glucose and which numbers are too low for him -Continue Lantus 50 units every morning -Continue NovoLog 15 units with lunch and dinner -Follow-up in 1 month for diabetes  Fatigue associated with anemia Patient continues to be sleepy.  Today he was sleepier than usual and falling asleep intermittently on exam.  Point-of-care glucose today was high at 279, ruling out hypoglycemia.  Initially suspected this was multifactorial to CHF, CKD, anemia, hypothyroidism.  However given his level of fatigue and sleepiness will look into possible OSA.  Per epic review he has had a history of OSA before.  He does not currently wear CPAP machine.  Does not appear to be on any sedating medications besides tramadol, he did not take any of that today. -Referral to sleep medicine placed -Follow-up in 1 month  Hypothyroidism TSH was above normal and Synthroid was increased to 75 mcg last week.  Patient picked up the prescription but has not started taking his new dose yet.  Performed a thorough medication review today including looking at all individual pill bottles and  ensuring that there instructions were correct.  Additionally combined medication bottles that were duplicates. -Start taking Synthroid 75 mg daily  Orders Placed This Encounter  Procedures  . Pneumococcal conjugate vaccine 13-valent IM  . Ambulatory referral to Sleep Studies    Referral Priority:   Routine    Referral Type:   Consultation    Referral Reason:   Specialty Services Required    Number of Visits Requested:   1  . Glucose (CBG)  . HgB A1c   Smitty Cords, MD [place], PGY-3

## 2017-01-05 NOTE — Assessment & Plan Note (Signed)
Patient continues to be sleepy.  Today he was sleepier than usual and falling asleep intermittently on exam.  Point-of-care glucose today was high at 279, ruling out hypoglycemia.  Initially suspected this was multifactorial to CHF, CKD, anemia, hypothyroidism.  However given his level of fatigue and sleepiness will look into possible OSA.  Per epic review he has had a history of OSA before.  He does not currently wear CPAP machine.  Does not appear to be on any sedating medications besides tramadol, he did not take any of that today. -Referral to sleep medicine placed -Follow-up in 1 month

## 2017-01-06 ENCOUNTER — Ambulatory Visit (INDEPENDENT_AMBULATORY_CARE_PROVIDER_SITE_OTHER): Payer: Medicare Other

## 2017-01-06 ENCOUNTER — Ambulatory Visit (INDEPENDENT_AMBULATORY_CARE_PROVIDER_SITE_OTHER): Payer: Medicare Other | Admitting: Orthopedic Surgery

## 2017-01-06 DIAGNOSIS — I82512 Chronic embolism and thrombosis of left femoral vein: Secondary | ICD-10-CM | POA: Diagnosis not present

## 2017-01-06 DIAGNOSIS — Z7901 Long term (current) use of anticoagulants: Secondary | ICD-10-CM

## 2017-01-06 LAB — POCT INR: INR: 2.2

## 2017-01-06 NOTE — Patient Instructions (Signed)
Start taking 2.5 mg (1/2 tablet) daily except 5 mg(1 tablet) on Fridays. Recheck in 2 weeks. Call if placed on any new medications or if scheduled for any procedures or surgery [phone].

## 2017-01-07 ENCOUNTER — Other Ambulatory Visit (HOSPITAL_COMMUNITY): Payer: Self-pay

## 2017-01-07 ENCOUNTER — Encounter (HOSPITAL_COMMUNITY): Payer: Self-pay

## 2017-01-07 NOTE — Progress Notes (Signed)
Paramedicine Encounter    Patient ID: Leonard [name], male    DOB: 11/05/1950, 66 y.o.   MRN: [id number]    Patient Care Team: [name], MD as PCP - General (Family Medicine) [name], MD as Consulting Physician (Nephrology)  Patient Active Problem List   Diagnosis Date Noted  . Type 2 diabetes mellitus with pressure callus ([place]) 12/29/2016  . Idiopathic chronic gout of left foot without tophus   . Left foot pain 10/29/2016  . Long term (current) use of anticoagulants [Z79.01] 09/09/2016  . Chronic deep vein thrombosis (DVT) of femoral vein of left lower extremity ([place]) 09/03/2016  . Morbid obesity due to excess calories ([place]) 03/17/2016  . Anemia due to stage 4 chronic kidney disease treated with darbepoetin ([place]) 02/28/2016  . Fatigue associated with anemia 01/21/2016  . Diabetic retinopathy ([place]) 11/13/2015  . CHF (congestive heart failure) ([place]) 08/27/2015  . Iron deficiency anemia 08/03/2015  . Claudication ([place])   . Diabetes ([place])   . Hematuria 06/27/2015  . Skin lesion of face 02/22/2015  . 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 Deficiency anemia 01/15/2015  . CKD (chronic kidney disease) stage 4, GFR 15-29 ml/min ([facility]) 01/15/2015  . Hypothyroidism 11/22/2014  . COPD GOLD III  02/16/2014  . History of tobacco use 08/23/2013  . 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 CAD (coronary artery disease) 04/06/2013  . PVD - hx of Rt SFA PTA and s/p Rt 4-5th toe amp Dec 2014 04/05/2013  . Essential hypertension 11/25/2012  . Mixed hyperlipidemia 07/17/2008    Current Outpatient Medications:  .  allopurinol (ZYLOPRIM) 100 MG tablet, Take 1 tablet (100 mg total) by mouth 2 (two) times daily., Disp: 60 tablet, Rfl: 0 .  amLODipine (NORVASC) 10 MG tablet, Take 1 tablet (10 mg total) by mouth daily., Disp: 30 tablet, Rfl: 6 .  aspirin 81 MG chewable tablet, Chew 81 mg by mouth daily., Disp: , Rfl:  .  atorvastatin (LIPITOR) 40 MG tablet, TAKE  ONE (1) TABLET BY MOUTH EVERY DAY AT 6:00PM (Patient taking differently: Take 40 mg by mouth once a day at 6 PM), Disp: 90 tablet, Rfl: 3 .  Blood Glucose Monitoring Suppl (ONETOUCH VERIO FLEX SYSTEM) w/Device KIT, 1 kit by Does not apply route daily., Disp: 1 kit, Rfl: 3 .  carvedilol (COREG) 25 MG tablet, Take 2 tablets (50 mg total) by mouth 2 (two) times daily with a meal., Disp: 120 tablet, Rfl: 3 .  cloNIDine (CATAPRES) 0.2 MG tablet, TAKE TWO (2) TABLETS BY MOUTH 2 TIMES DAILY (Patient taking differently: Take 0.4 mg by mouth two times a day), Disp: 120 tablet, Rfl: 3 .  colchicine 0.6 MG tablet, Take 1 tablet (0.6 mg total) 2 (two) times daily by mouth., Disp: 30 tablet, Rfl: 0 .  doxazosin (CARDURA) 8 MG tablet, Take 1 tablet (8 mg total) by mouth every evening., Disp: , Rfl:  .  ferrous sulfate 325 (65 FE) MG tablet, TAKE ONE (1) TABLET BY MOUTH TWO (2) TIMES DAILY WITH A MEAL (Patient taking differently: Take 325 mg by mouth two times a day with a meal), Disp: 60 tablet, Rfl: 3 .  hydrALAZINE (APRESOLINE) 100 MG tablet, TAKE ONE TABLET BY MOUTH EVERY EIGHT HOURS (Patient taking differently: Take 100 mg by mouth every 8 hours), Disp: 90 tablet, Rfl: 1 .  insulin aspart (NOVOLOG FLEXPEN) 100 UNIT/ML FlexPen, Inject 15 Units 2 (two) times daily before lunch and supper into the skin.,  Disp: 15 mL, Rfl: 5 .  Insulin Glargine (LANTUS SOLOSTAR) 100 UNIT/ML Solostar Pen, Inject 50 Units into the skin every morning. (Patient taking differently: Inject 50 Units into the skin daily after breakfast. ), Disp: 15 mL, Rfl: 3 .  isosorbide mononitrate (IMDUR) 60 MG 24 hr tablet, Take 60 mg by mouth daily., Disp: , Rfl:  .  levothyroxine (SYNTHROID, LEVOTHROID) 75 MCG tablet, Take 1 tablet (75 mcg total) by mouth daily., Disp: 90 tablet, Rfl: 1 .  pantoprazole (PROTONIX) 40 MG tablet, TAKE ONE (1) TABLET BY MOUTH EVERY DAY (Patient taking differently: Take 40 mg by mouth once a day), Disp: 90 tablet, Rfl: 0 .   potassium chloride SA (K-DUR,KLOR-CON) 20 MEQ tablet, Take 20 mEq by mouth daily., Disp: , Rfl:  .  torsemide (DEMADEX) 20 MG tablet, Take 3 tablets (60 mg total) by mouth daily. May take an extra 20 mg Daily for weight gain above 212 lbs (Patient taking differently: Take 20 mg by mouth 3 (three) times daily. And may take an extra 20 mg daily for weight gain above 212 lbs), Disp: 90 tablet, Rfl: 6 .  warfarin (COUMADIN) 5 MG tablet, Take 2.5 mg (1/2 tablet) daily except 5 mg (1 tablet) on Monday and Friday, Disp: 18 tablet, Rfl: 1 .  Darbepoetin Alfa (ARANESP) 500 MCG/ML SOSY injection, Inject 500 mcg into the skin every 14 (fourteen) days. , Disp: , Rfl:  .  enoxaparin (LOVENOX) 100 MG/ML injection, Inject 0.9 mLs (90 mg total) into the skin daily. Please inject ONLY 0.9 mLs (20m total) on 12/04/16. Do NOT use whole syringe, Disp: 0.9 mL, Rfl: 0 .  ferumoxytol 510 mg in sodium chloride 0.9 % 100 mL, Inject into the vein every 7 (seven) days. , Disp: , Rfl:  .  glucose blood (ONETOUCH VERIO) test strip, Use as instructed to test three times daily. ICD-10 code: E11.22, Disp: 100 each, Rfl: 12 .  nitroGLYCERIN (NITROSTAT) 0.4 MG SL tablet, Place 1 tablet (0.4 mg total) under the tongue every 5 (five) minutes as needed for chest pain., Disp: 30 tablet, Rfl: 12 .  ONETOUCH DELICA LANCETS 367TMISC, 1 each by Does not apply route 3 (three) times daily. ICD-10 code: E11.22, Disp: 100 each, Rfl: 12 .  traMADol (ULTRAM) 50 MG tablet, Take 1 tablet (50 mg total) every 8 (eight) hours as needed by mouth (for pain)., Disp: 30 tablet, Rfl: 0 No current facility-administered medications for this visit.   Facility-Administered Medications Ordered in Other Visits:  .  sodium chloride flush (NS) 0.9 % injection 10 mL, 10 mL, Intracatheter, PRN, GAlvy Bimler Ni, MD No Known Allergies   Social History   Socioeconomic History  . Marital status: Single    Spouse name: Not on file  . Number of children: Not on file  .  Years of education: Not on file  . Highest education level: Not on file  Social Needs  . Financial resource strain: Not on file  . Food insecurity - worry: Not on file  . Food insecurity - inability: Not on file  . Transportation needs - medical: Not on file  . Transportation needs - non-medical: Not on file  Occupational History  . Not on file  Tobacco Use  . Smoking status: Former Smoker    Packs/day: 1.00    Years: 46.00    Pack years: 46.00    Types: Cigarettes    Start date: 02/04/1967    Last attempt to quit: 02/03/2013  Years since quitting: 3.9  . Smokeless tobacco: Never Used  . Tobacco comment: Doing well with quitting.  Substance and Sexual Activity  . Alcohol use: No    Alcohol/week: 0.0 oz  . Drug use: No  . Sexual activity: Not Currently  Other Topics Concern  . Not on file  Social History Narrative   Lives with sister, [name] in [place].  Retired - former Sports coach.    Physical Exam  Pulmonary/Chest: No respiratory distress. He has no wheezes. He has no rales.  Abdominal: He exhibits no distension. There is no tenderness. There is no guarding.  Musculoskeletal: He exhibits edema.  Right foot and leg  Skin: Skin is warm and dry. He is not diaphoretic.        Future Appointments  Date Time Provider [place]  01/14/2017  3:45 PM Newt Minion, MD [facility] None  01/16/2017  1:45 PM [facility] LAB 6 [facility] None  01/16/2017  2:15 PM [facility] A3 [facility] None  01/16/2017  5:30 PM [facility] J32 DNS [facility] None  01/20/2017  3:15 PM [facility]  01/30/2017 11:00 AM [facility] LAB 6 [facility] None  01/30/2017 11:30 AM [facility] I26 DNS [facility] None  01/30/2017  3:00 PM [facility] I27 DNS [facility] None  02/04/2017 10:15 AM Gardiner Barefoot, DPM [facility]  02/13/2017 11:00 AM [facility] ONLY [facility] None  02/13/2017 11:30 AM [facility] E16 [facility] None   02/13/2017  3:00 PM [facility] I27 DNS [facility] None  02/27/2017 11:30 AM [facility] ONLY [facility] None  02/27/2017 12:00 PM [facility] I26 DNS [facility] None  02/27/2017  3:30 PM [facility] J32 DNS [facility] None  03/13/2017 11:30 AM [facility] ONLY [facility] None  03/13/2017 12:00 PM [facility] B6 [facility] None  03/13/2017  3:30 PM [facility] INJ NURSE [facility] None  03/27/2017 11:15 AM [facility] LAB 2 [facility] None  03/27/2017 11:45 AM Owens Shark, NP [facility] None  03/27/2017 12:45 PM [facility] B6 [facility] None  03/27/2017  4:00 PM [facility] INJ NURSE [facility] None  04/10/2017 11:30 AM [facility] ONLY [facility] None  04/10/2017 12:00 PM [facility] A1 [facility] None  04/10/2017  3:30 PM [facility] INJ NURSE [facility] None  04/24/2017 11:30 AM [facility] LAB 5 [facility] None  04/24/2017 12:00 PM [facility] A3 [facility] None  04/24/2017  4:00 PM [facility] J32 DNS [facility] None    ATF pt CAO x4 sitting in the recliner c/o right foot pain. Pt has hx of chronic pain and swelling in his right foot and leg due to blood clot.  Pt has taken a tramadol prior to my arrival but he hasn't taken his morning meds or his meds for last night.  Pt has no other compalints today. We discussed him missing his medications; he had several days of medications that he missed.  He stated that he went to his pcp yesterday but was unable to visit with him because he had an emergency.  rx bottles verified and pill box refilled.    Pt went to lab yesterday and his pill box filled according to his last INR results.  Pt had issues with diarrhea during the last [facility] visit he stated that he no longer has those issues.  Pt took his morning medications during our visit; all except the insulin. He stated that he was waiting for his home aide to cook before he takes the insulin; CBG 533.  Pt denies sob, chest pain and dizziness.      BP (!) 172/70 (BP Location: Right  Arm, Patient  Position: Sitting, Cuff Size: Normal)   Pulse 87   Resp 16   Wt 200 lb (90.7 kg)   SpO2 98%   BMI 30.41 kg/m   cbg 533 Weight yesterday-200 Last visit weight-196  **rx called in: Amlodipine filled until mon ** warfarin placed in the box according to the lab notes from 12/4 Jestine Bicknell, EMT Paramedic 01/07/2017    ACTION: Home visit completed

## 2017-01-13 ENCOUNTER — Encounter (HOSPITAL_COMMUNITY): Payer: Self-pay

## 2017-01-13 ENCOUNTER — Other Ambulatory Visit: Payer: Self-pay | Admitting: Family Medicine

## 2017-01-13 ENCOUNTER — Other Ambulatory Visit (HOSPITAL_COMMUNITY): Payer: Self-pay | Admitting: Student

## 2017-01-13 ENCOUNTER — Other Ambulatory Visit (HOSPITAL_COMMUNITY): Payer: Self-pay

## 2017-01-13 ENCOUNTER — Telehealth (HOSPITAL_COMMUNITY): Payer: Self-pay | Admitting: *Deleted

## 2017-01-13 DIAGNOSIS — I5032 Chronic diastolic (congestive) heart failure: Secondary | ICD-10-CM

## 2017-01-13 DIAGNOSIS — I10 Essential (primary) hypertension: Secondary | ICD-10-CM

## 2017-01-13 NOTE — Progress Notes (Signed)
Paramedicine Encounter    Patient ID: Leonard [name], male    DOB: 05/10/1950, 66 y.o.   MRN: [id number]    Patient Care Team: [name], MD as PCP - General (Family Medicine) [name], MD as Consulting Physician (Nephrology)  Patient Active Problem List   Diagnosis Date Noted  . 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 Idiopathic chronic gout of left foot without tophus   . Left foot pain 10/29/2016  . Long term (current) use of anticoagulants [Z79.01] 09/09/2016  . Chronic deep vein thrombosis (DVT) of femoral vein of left lower extremity ([place]) 09/03/2016  . Morbid obesity due to excess calories ([place]) 03/17/2016  . Anemia due to stage 4 chronic kidney disease treated with darbepoetin ([place]) 02/28/2016  . Fatigue associated with anemia 01/21/2016  . Diabetic retinopathy ([place]) 11/13/2015  . CHF (congestive heart failure) ([place]) 08/27/2015  . Iron deficiency anemia 08/03/2015  . Claudication ([place])   . Diabetes ([place])   . Hematuria 06/27/2015  . Skin lesion of face 02/22/2015  . 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 Deficiency anemia 01/15/2015  . CKD (chronic kidney disease) stage 4, GFR 15-29 ml/min (HCC) 01/15/2015  . Hypothyroidism 11/22/2014  . COPD GOLD III  02/16/2014  . History of tobacco use 08/23/2013  . 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 CAD (coronary artery disease) 04/06/2013  . PVD - hx of Rt SFA PTA and s/p Rt 4-5th toe amp Dec 2014 04/05/2013  . Essential hypertension 11/25/2012  . Mixed hyperlipidemia 07/17/2008    Current Outpatient Medications:  .  allopurinol (ZYLOPRIM) 100 MG tablet, Take 1 tablet (100 mg total) by mouth 2 (two) times daily., Disp: 60 tablet, Rfl: 0 .  aspirin 81 MG chewable tablet, Chew 81 mg by mouth daily., Disp: , Rfl:  .  atorvastatin (LIPITOR) 40 MG tablet, TAKE ONE (1) TABLET BY MOUTH EVERY DAY AT 6:00PM (Patient taking differently: Take 40 mg by mouth once a day at 6  PM), Disp: 90 tablet, Rfl: 3 .  carvedilol (COREG) 25 MG tablet, Take 2 tablets (50 mg total) by mouth 2 (two) times daily with a meal., Disp: 120 tablet, Rfl: 3 .  cloNIDine (CATAPRES) 0.2 MG tablet, TAKE TWO (2) TABLETS BY MOUTH 2 TIMES DAILY (Patient taking differently: Take 0.4 mg by mouth two times a day), Disp: 120 tablet, Rfl: 3 .  colchicine 0.6 MG tablet, Take 1 tablet (0.6 mg total) 2 (two) times daily by mouth., Disp: 30 tablet, Rfl: 0 .  doxazosin (CARDURA) 8 MG tablet, Take 1 tablet (8 mg total) by mouth every evening., Disp: , Rfl:  .  ferrous sulfate 325 (65 FE) MG tablet, TAKE ONE (1) TABLET BY MOUTH TWO (2) TIMES DAILY WITH A MEAL (Patient taking differently: Take 325 mg by mouth two times a day with a meal), Disp: 60 tablet, Rfl: 3 .  hydrALAZINE (APRESOLINE) 100 MG tablet, TAKE ONE TABLET BY MOUTH EVERY EIGHT HOURS (Patient taking differently: Take 100 mg by mouth every 8 hours), Disp: 90 tablet, Rfl: 1 .  isosorbide mononitrate (IMDUR) 60 MG 24 hr tablet, Take 60 mg by mouth daily., Disp: , Rfl:  .  levothyroxine (SYNTHROID, LEVOTHROID) 75 MCG tablet, Take 1 tablet (75 mcg total) by mouth daily., Disp: 90 tablet, Rfl: 1 .  ONETOUCH DELICA LANCETS 38B MISC, 1 each by Does not apply route 3 (three) times daily. ICD-10 code: E11.22, Disp: 100 each, Rfl: 12 .  pantoprazole (PROTONIX) 40 MG tablet, TAKE ONE (1) TABLET BY MOUTH EVERY DAY (Patient taking differently: Take 40 mg by mouth once a day), Disp: 90 tablet, Rfl: 0 .  potassium chloride SA (K-DUR,KLOR-CON) 20 MEQ tablet, Take 20 mEq by mouth daily., Disp: , Rfl:  .  torsemide (DEMADEX) 20 MG tablet, Take 3 tablets (60 mg total) by mouth daily. May take an extra 20 mg Daily for weight gain above 212 lbs (Patient taking differently: Take 20 mg by mouth 3 (three) times daily. And may take an extra 20 mg daily for weight gain above 212 lbs), Disp: 90 tablet, Rfl: 6 .  warfarin (COUMADIN) 5 MG tablet, Take 2.5 mg (1/2 tablet) daily  except 5 mg (1 tablet) on Monday and Friday, Disp: 18 tablet, Rfl: 1 .  amLODipine (NORVASC) 10 MG tablet, Take 1 tablet (10 mg total) by mouth daily., Disp: 30 tablet, Rfl: 6 .  Blood Glucose Monitoring Suppl (ONETOUCH VERIO FLEX SYSTEM) w/Device KIT, 1 kit by Does not apply route daily., Disp: 1 kit, Rfl: 3 .  Darbepoetin Alfa (ARANESP) 500 MCG/ML SOSY injection, Inject 500 mcg into the skin every 14 (fourteen) days. , Disp: , Rfl:  .  enoxaparin (LOVENOX) 100 MG/ML injection, Inject 0.9 mLs (90 mg total) into the skin daily. Please inject ONLY 0.9 mLs (59m total) on 12/04/16. Do NOT use whole syringe, Disp: 0.9 mL, Rfl: 0 .  ferumoxytol 510 mg in sodium chloride 0.9 % 100 mL, Inject into the vein every 7 (seven) days. , Disp: , Rfl:  .  glucose blood (ONETOUCH VERIO) test strip, Use as instructed to test three times daily. ICD-10 code: E11.22, Disp: 100 each, Rfl: 12 .  insulin aspart (NOVOLOG FLEXPEN) 100 UNIT/ML FlexPen, Inject 15 Units 2 (two) times daily before lunch and supper into the skin., Disp: 15 mL, Rfl: 5 .  Insulin Glargine (LANTUS SOLOSTAR) 100 UNIT/ML Solostar Pen, Inject 50 Units into the skin every morning. (Patient taking differently: Inject 50 Units into the skin daily after breakfast. ), Disp: 15 mL, Rfl: 3 .  nitroGLYCERIN (NITROSTAT) 0.4 MG SL tablet, Place 1 tablet (0.4 mg total) under the tongue every 5 (five) minutes as needed for chest pain., Disp: 30 tablet, Rfl: 12 .  traMADol (ULTRAM) 50 MG tablet, Take 1 tablet (50 mg total) every 8 (eight) hours as needed by mouth (for pain)., Disp: 30 tablet, Rfl: 0 No current facility-administered medications for this visit.   Facility-Administered Medications Ordered in Other Visits:  .  sodium chloride flush (NS) 0.9 % injection 10 mL, 10 mL, Intracatheter, PRN, GAlvy Bimler Ni, MD No Known Allergies   Social History   Socioeconomic History  . Marital status: Single    Spouse name: Not on file  . Number of children: Not on  file  . Years of education: Not on file  . Highest education level: Not on file  Social Needs  . Financial resource strain: Not on file  . Food insecurity - worry: Not on file  . Food insecurity - inability: Not on file  . Transportation needs - medical: Not on file  . Transportation needs - non-medical: Not on file  Occupational History  . Not on file  Tobacco Use  . Smoking status: Former Smoker    Packs/day: 1.00    Years: 46.00    Pack years: 46.00    Types: Cigarettes    Start date: 02/04/1967    Last attempt to quit: 02/03/2013    Years  since quitting: 3.9  . Smokeless tobacco: Never Used  . Tobacco comment: Doing well with quitting.  Substance and Sexual Activity  . Alcohol use: No    Alcohol/week: 0.0 oz  . Drug use: No  . Sexual activity: Not Currently  Other Topics Concern  . Not on file  Social History Narrative   Lives with sister, [name] in [place].  Retired - former Sports coach.    Physical Exam  Pulmonary/Chest: No respiratory distress. He has no wheezes. He has no rales.  Abdominal: He exhibits no distension. There is no tenderness. There is no guarding.  Musculoskeletal: He exhibits edema.  Left foot swelling and discolored; hx of same  Skin: Skin is warm and dry. He is not diaphoretic.        Future Appointments  Date Time Provider [place]  01/14/2017  3:45 PM Newt Minion, MD [facility] None  01/16/2017  1:45 PM [facility] LAB 6 [facility] None  01/16/2017  2:15 PM [facility] A3 [facility] None  01/16/2017  5:30 PM [facility] J32 DNS [facility] None  01/20/2017  3:15 PM [facility]  01/30/2017 11:00 AM [facility] LAB 6 [facility] None  01/30/2017 11:30 AM [facility] I26 DNS [facility] None  01/30/2017  3:00 PM [facility] I27 DNS [facility] None  02/04/2017 10:15 AM Gardiner Barefoot, DPM [facility]  02/13/2017 11:00 AM [facility] ONLY [facility] None  02/13/2017 11:30 AM  [facility] E16 [facility] None  02/13/2017  3:00 PM [facility] I27 DNS [facility] None  02/27/2017 11:30 AM [facility] ONLY [facility] None  02/27/2017 12:00 PM [facility] I26 DNS [facility] None  02/27/2017  3:30 PM [facility] J32 DNS [facility] None  03/13/2017 11:30 AM [facility] ONLY [facility] None  03/13/2017 12:00 PM [facility] B6 [facility] None  03/13/2017  3:30 PM [facility] INJ NURSE [facility] None  03/27/2017 11:15 AM [facility] LAB 2 [facility] None  03/27/2017 11:45 AM Owens Shark, NP [facility] None  03/27/2017 12:45 PM [facility] B6 [facility] None  03/27/2017  4:00 PM [facility] INJ NURSE [facility] None  04/10/2017 11:30 AM [facility] ONLY [facility] None  04/10/2017 12:00 PM [facility] A1 [facility] None  04/10/2017  3:30 PM [facility] INJ NURSE [facility] None  04/24/2017 11:30 AM [facility] LAB 5 [facility] None  04/24/2017 12:00 PM [facility] A3 [facility] None  04/24/2017  4:00 PM [facility] J32 DNS [facility] None    ATF pt CAO 4 sitting in the recliner c/o left foot pain. Pt's foot is still swollen and discolored.  Pt has an appointment with dr. Sharol Given tomorrow to assess his leg.  Pt stated that the pain isn't as bad as it normally is.  Pt missed several doses from day and evening during this week, including last night.  Pt is still not taking the afternoon dose of hydralazine.  He has no other complaints today; he denies sob, chest pain, dizziness, and bleeding.  rx bottles verified and pill box refilled.  Pt's weight is up by four lbs over night, but he refuses to take additional meds.  He told me that he will call if he "starts to feel bad but he feels good right now".  I advised Heather at the heart clinic the same.   BP (!) 144/64 (BP Location: Right Arm, Patient Position: Sitting, Cuff Size: Normal)   Pulse 73   Resp 12   Wt 195 lb 8 oz (88.7 kg)   SpO2 98%   BMI 29.73 kg/m   cbg 196  *warfarin placed in the box  according to his last INR  check on 12/4: 2.5 mg in everyday besides Friday 5.0 mg.   **rx called in: (pharmacist stated that all of these must be sent to the physician because he has ran out of refills) Pantoprazole Isosorbide Colchicine Carvedilol asa  Weight yesterday-191 Last visit weight-200    Ameila Weldon, EMT Paramedic 01/13/2017    ACTION: Home visit completed Next visit planned for next tuesday

## 2017-01-13 NOTE — Telephone Encounter (Signed)
Dee called to report pts wt was up 4 lbs from yesterday, pt denied SOB and only had slight edema.  She states pt did admit he missed 2 doses of Torsemide over the weekend and missed last nights dose too.  She states pt told her he was not going to take any extra Torsemide because he felt ok. She encouraged him to take meds as prescribe and try not to forget any doses.  She has advised pt to call her or our office if wt increases or develops symtpoms

## 2017-01-14 ENCOUNTER — Encounter (INDEPENDENT_AMBULATORY_CARE_PROVIDER_SITE_OTHER): Payer: Self-pay | Admitting: Orthopedic Surgery

## 2017-01-14 ENCOUNTER — Ambulatory Visit (INDEPENDENT_AMBULATORY_CARE_PROVIDER_SITE_OTHER): Payer: Medicare Other

## 2017-01-14 ENCOUNTER — Ambulatory Visit (INDEPENDENT_AMBULATORY_CARE_PROVIDER_SITE_OTHER): Payer: Medicare Other | Admitting: Orthopedic Surgery

## 2017-01-14 VITALS — Ht 68.0 in | Wt 195.0 lb

## 2017-01-14 DIAGNOSIS — E1142 Type 2 diabetes mellitus with diabetic polyneuropathy: Secondary | ICD-10-CM | POA: Insufficient documentation

## 2017-01-14 DIAGNOSIS — M216X2 Other acquired deformities of left foot: Secondary | ICD-10-CM | POA: Diagnosis not present

## 2017-01-14 DIAGNOSIS — M25572 Pain in left ankle and joints of left foot: Secondary | ICD-10-CM

## 2017-01-14 DIAGNOSIS — M1A0720 Idiopathic chronic gout, left ankle and foot, without tophus (tophi): Secondary | ICD-10-CM

## 2017-01-14 DIAGNOSIS — I779 Disorder of arteries and arterioles, unspecified: Secondary | ICD-10-CM

## 2017-01-14 HISTORY — DX: Type 2 diabetes mellitus with diabetic polyneuropathy: E11.42

## 2017-01-14 HISTORY — DX: Other acquired deformities of left foot: M21.6X2

## 2017-01-14 NOTE — Progress Notes (Signed)
Office Visit Note   Patient: Leonard [name]           Date of Birth: October 01, 1950           MRN: [id number] Visit Date: 01/14/2017              Requested by: [name], MD [address] [place], [place] [address] PCP: [name], MD  Chief Complaint  Patient presents with  . Left Ankle - Follow-up    S/p injection 12/16/16      HPI: Patient  Assessment & Plan: Visit Diagnoses:  1. Pain in left ankle and joints of left foot   2. Idiopathic chronic gout of left foot without tophus   3. Collapse of left talus   4. Diabetic polyneuropathy associated with type 2 diabetes mellitus ([place])     Plan: We will place the patient in a fracture boot protected weightbearing follow-up in 3 weeks.  Discussed that we most likely will need to proceed with a tibial calcaneal fusion.  Discussed with his severe peripheral vascular disease he has an increased risk of the incision is not healing and wound complications.  We will see if we can get him asymptomatic with the fracture boot.  Recommended that he wear the compression stockings daily.  Discussed that he needs to check the skin daily if he has increased swelling and this presses on the fracture boot he could develop blisters or ulcers.  He will follow-up immediately if he develops any ulcers or blisters.  Follow-Up Instructions: Return in about 3 weeks (around 02/04/2017).   Ortho Exam  Patient is alert, oriented, no adenopathy, well-dressed, normal affect, normal respiratory effort. Examination patient has an antalgic gait.  He is an unstable ankle and subtalar joint with manipulation.  I cannot palpate a pulse.  There is no redness no cellulitis no open ulcers no drainage.  Patient is collapse of the talus seems to be due more to cystic changes of the bone rather than a Charcot arthropathy.  There is no redness in the foot that would be more consistent with an acute Charcot process.  Imaging: Xr Ankle 2 Views  Left  Result Date: 01/14/2017 2 view radiographs of the left ankle shows severe peripheral vascular disease with calcification L up to the midfoot.  Patient has had acute collapse of the talus with complete collapse of the subtalar joint as well as collapse through the tibial talar joint.  No images are attached to the encounter.  Labs: Lab Results  Component Value Date   HGBA1C 6.9 01/05/2017   HGBA1C 11.0 10/10/2016   HGBA1C 11.1 07/01/2016   ESRSEDRATE 117 (H) 12/29/2016   ESRSEDRATE 118 (H) 12/18/2016   ESRSEDRATE 130 (H) 12/01/2016   CRP 7.3 (H) 12/01/2016   CRP <0.5 (L) 04/04/2013   CRP 2.6 (H) 02/16/2013   LABURIC 5.3 12/29/2016   LABURIC 7.7 (H) 12/02/2016   LABURIC 10.9 (H) 09/17/2016   REPTSTATUS 12/06/2016 FINAL 12/01/2016   GRAMSTAIN  01/28/2013    RARE WBC PRESENT, PREDOMINANTLY PMN NO SQUAMOUS EPITHELIAL CELLS SEEN NO ORGANISMS SEEN Performed at [facility]  01/28/2013    RARE WBC PRESENT, PREDOMINANTLY PMN NO SQUAMOUS EPITHELIAL CELLS SEEN NO ORGANISMS SEEN Performed at [facility]   CULT NO GROWTH 5 DAYS 12/01/2016   LABORGA ESCHERICHIA COLI (A) 01/14/2016    @LABSALLVALUES (HGBA1)@  Body mass index is 29.65 kg/m.  Orders:  Orders Placed This Encounter  Procedures  .  XR Ankle 2 Views Left   No orders of the defined types were placed in this encounter.    Procedures: No procedures performed  Clinical Data: No additional findings.  ROS:  All other systems negative, except as noted in the HPI. Review of Systems  Objective: Vital Signs: Ht 5\' 8"  (1.727 m)   Wt 195 lb (88.5 kg)   BMI 29.65 kg/m   Specialty Comments:  No specialty comments available.  PMFS History: Patient Active Problem List   Diagnosis Date Noted  . Collapse of left talus 01/14/2017  . Diabetic polyneuropathy associated with type 2 diabetes mellitus ([place]) 01/14/2017  . 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  Idiopathic chronic gout of left foot without tophus   . Left foot pain 10/29/2016  . Long term (current) use of anticoagulants [Z79.01] 09/09/2016  . Chronic deep vein thrombosis (DVT) of femoral vein of left lower extremity ([place]) 09/03/2016  . Morbid obesity due to excess calories ([place]) 03/17/2016  . Anemia due to stage 4 chronic kidney disease treated with darbepoetin ([place]) 02/28/2016  . Fatigue associated with anemia 01/21/2016  . Diabetic retinopathy ([place]) 11/13/2015  . CHF (congestive heart failure) ([place]) 08/27/2015  . Iron deficiency anemia 08/03/2015  . Claudication ([place])   . Diabetes ([place])   . Hematuria 06/27/2015  . Skin lesion of face 02/22/2015  . 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 Deficiency anemia 01/15/2015  . CKD (chronic kidney disease) stage 4, GFR 15-29 ml/min (HCC) 01/15/2015  . Hypothyroidism 11/22/2014  . COPD GOLD III  02/16/2014  . History of tobacco use 08/23/2013  . 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 CAD (coronary artery disease) 04/06/2013  . PVD - hx of Rt SFA PTA and s/p Rt 4-5th toe amp Dec 2014 04/05/2013  . Essential hypertension 11/25/2012  . Mixed hyperlipidemia 07/17/2008   Past Medical History:  Diagnosis Date  . Cancer ([place])    skin cancer-  . CHF (congestive heart failure) ([place])   . Chronic kidney disease    not on dialysis  . Coronary artery disease   . Gangrene of toe ([place])    right 5th/notes 01/04/2013   . High cholesterol   . Hypertension   . Iron deficiency anemia 08/03/2015  . 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 PAD (peripheral artery disease) ([place])   . Shortness of breath dyspnea    with exertion  . Type II diabetes mellitus (HCC)    Type II    Family History  Problem Relation Age of Onset  . Cancer Mother        lung cancer  . Hypertension Mother   . Cancer Sister        patient thinks it was uterine cancer  . Hypertension Sister   . Heart attack Sister   . Stroke  Neg Hx     Past Surgical History:  Procedure Laterality Date  . AMPUTATION Right 01/28/2013   Procedure: RAY AMPUTATION RIGHT  4th & 5th TOE;  Surgeon: Rosetta Posner, MD;  Location: [facility] OR;  Service: Vascular;  Laterality: Right;  . ANGIOPLASTY / STENTING FEMORAL Right 01/13/2013   superficial femoral artery x 2 (3 mm x 60 mm, 4 mm x 60 mm)  . AV FISTULA PLACEMENT Left 02/11/2016   Procedure: LEFT UPPER ARM BRACHIAL CEPHALIC ARTERIOVENOUS (AV) FISTULA CREATION;  Surgeon: Conrad Concordia, MD;  Location: [facility];  Service: Vascular;  Laterality: Left;  .  COLONOSCOPY W/ POLYPECTOMY    . CORONARY ARTERY BYPASS GRAFT N/A 04/07/2013   Procedure: CORONARY ARTERY BYPASS GRAFTING (CABG);  Surgeon: Ivin Poot, MD;  Location: [facility];  Service: Open Heart Surgery;  Laterality: N/A;  . INTRAOPERATIVE TRANSESOPHAGEAL ECHOCARDIOGRAM N/A 04/07/2013   Procedure: INTRAOPERATIVE TRANSESOPHAGEAL ECHOCARDIOGRAM;  Surgeon: Ivin Poot, MD;  Location: [facility];  Service: Open Heart Surgery;  Laterality: N/A;  . LEFT HEART CATHETERIZATION WITH CORONARY ANGIOGRAM N/A 04/05/2013   Procedure: LEFT HEART CATHETERIZATION WITH CORONARY ANGIOGRAM;  Surgeon: Peter M Martinique, MD;  Location: [facility] CATH LAB;  Service: Cardiovascular;  Laterality: N/A;  . LOWER EXTREMITY ANGIOGRAM Bilateral 01/06/2013   Procedure: LOWER EXTREMITY ANGIOGRAM;  Surgeon: Conrad Junior, MD;  Location: [facility] CATH LAB;  Service: Cardiovascular;  Laterality: Bilateral;  . LOWER EXTREMITY ANGIOGRAM Right 01/13/2013   Procedure: LOWER EXTREMITY ANGIOGRAM;  Surgeon: Conrad Edgewood, MD;  Location: [facility] CATH LAB;  Service: Cardiovascular;  Laterality: Right;  . SKIN CANCER EXCISION  2017  . THROAT SURGERY  1983   polyps  . TONSILLECTOMY AND ADENOIDECTOMY  ~ 1965   Social History   Occupational History  . Not on file  Tobacco Use  . Smoking status: Former Smoker    Packs/day: 1.00    Years: 46.00    Pack years: 46.00    Types: Cigarettes    Start date: 02/04/1967     Last attempt to quit: 02/03/2013    Years since quitting: 3.9  . Smokeless tobacco: Never Used  . Tobacco comment: Doing well with quitting.  Substance and Sexual Activity  . Alcohol use: No    Alcohol/week: 0.0 oz  . Drug use: No  . Sexual activity: Not Currently

## 2017-01-16 ENCOUNTER — Ambulatory Visit: Payer: Medicare Other

## 2017-01-16 ENCOUNTER — Ambulatory Visit (HOSPITAL_BASED_OUTPATIENT_CLINIC_OR_DEPARTMENT_OTHER): Payer: Medicare Other

## 2017-01-16 ENCOUNTER — Other Ambulatory Visit (HOSPITAL_BASED_OUTPATIENT_CLINIC_OR_DEPARTMENT_OTHER): Payer: Medicare Other

## 2017-01-16 ENCOUNTER — Other Ambulatory Visit: Payer: Self-pay | Admitting: Hematology and Oncology

## 2017-01-16 VITALS — BP 144/62 | HR 64 | Temp 98.2°F | Resp 16

## 2017-01-16 DIAGNOSIS — N184 Chronic kidney disease, stage 4 (severe): Secondary | ICD-10-CM

## 2017-01-16 DIAGNOSIS — D631 Anemia in chronic kidney disease: Secondary | ICD-10-CM | POA: Diagnosis not present

## 2017-01-16 DIAGNOSIS — N189 Chronic kidney disease, unspecified: Secondary | ICD-10-CM

## 2017-01-16 DIAGNOSIS — D472 Monoclonal gammopathy: Secondary | ICD-10-CM | POA: Diagnosis not present

## 2017-01-16 DIAGNOSIS — D649 Anemia, unspecified: Secondary | ICD-10-CM | POA: Diagnosis not present

## 2017-01-16 LAB — CBC WITH DIFFERENTIAL/PLATELET
BASO%: 0.8 % (ref 0.0–2.0)
BASOS ABS: 0 10*3/uL (ref 0.0–0.1)
EOS%: 3.3 % (ref 0.0–7.0)
Eosinophils Absolute: 0.1 10*3/uL (ref 0.0–0.5)
HCT: 23.6 % — ABNORMAL LOW (ref 38.4–49.9)
HEMOGLOBIN: 7.3 g/dL — AB (ref 13.0–17.1)
LYMPH%: 18 % (ref 14.0–49.0)
MCH: 23.5 pg — AB (ref 27.2–33.4)
MCHC: 30.8 g/dL — AB (ref 32.0–36.0)
MCV: 76.2 fL — ABNORMAL LOW (ref 79.3–98.0)
MONO#: 0.5 10*3/uL (ref 0.1–0.9)
MONO%: 11.2 % (ref 0.0–14.0)
NEUT#: 3 10*3/uL (ref 1.5–6.5)
NEUT%: 66.7 % (ref 39.0–75.0)
Platelets: 271 10*3/uL (ref 140–400)
RBC: 3.1 10*6/uL — ABNORMAL LOW (ref 4.20–5.82)
RDW: 21.7 % — AB (ref 11.0–14.6)
WBC: 4.5 10*3/uL (ref 4.0–10.3)
lymph#: 0.8 10*3/uL — ABNORMAL LOW (ref 0.9–3.3)

## 2017-01-16 LAB — PREPARE RBC (CROSSMATCH)

## 2017-01-16 MED ORDER — DARBEPOETIN ALFA 500 MCG/ML IJ SOSY
PREFILLED_SYRINGE | INTRAMUSCULAR | Status: AC
  Filled 2017-01-16: qty 1

## 2017-01-16 MED ORDER — ACETAMINOPHEN 325 MG PO TABS
ORAL_TABLET | ORAL | Status: AC
  Filled 2017-01-16: qty 2

## 2017-01-16 MED ORDER — DIPHENHYDRAMINE HCL 25 MG PO CAPS
25.0000 mg | ORAL_CAPSULE | Freq: Once | ORAL | Status: AC
  Administered 2017-01-16: 25 mg via ORAL

## 2017-01-16 MED ORDER — DIPHENHYDRAMINE HCL 25 MG PO CAPS
ORAL_CAPSULE | ORAL | Status: AC
  Filled 2017-01-16: qty 1

## 2017-01-16 MED ORDER — SODIUM CHLORIDE 0.9 % IV SOLN
250.0000 mL | Freq: Once | INTRAVENOUS | Status: AC
  Administered 2017-01-16: 250 mL via INTRAVENOUS

## 2017-01-16 MED ORDER — DARBEPOETIN ALFA 500 MCG/ML IJ SOSY
500.0000 ug | PREFILLED_SYRINGE | Freq: Once | INTRAMUSCULAR | Status: AC
  Administered 2017-01-16: 500 ug via SUBCUTANEOUS

## 2017-01-16 MED ORDER — ACETAMINOPHEN 325 MG PO TABS
650.0000 mg | ORAL_TABLET | Freq: Once | ORAL | Status: AC
  Administered 2017-01-16: 650 mg via ORAL

## 2017-01-16 NOTE — Patient Instructions (Signed)

## 2017-01-18 LAB — TYPE AND SCREEN
ABO/RH(D): B NEG
Antibody Screen: NEGATIVE
UNIT DIVISION: 0

## 2017-01-18 LAB — BPAM RBC
BLOOD PRODUCT EXPIRATION DATE: 201812282359
ISSUE DATE / TIME: 201812141519
UNIT TYPE AND RH: 1700

## 2017-01-19 LAB — KAPPA/LAMBDA LIGHT CHAINS
IG KAPPA FREE LIGHT CHAIN: 143.2 mg/L — AB (ref 3.3–19.4)
IG LAMBDA FREE LIGHT CHAIN: 74.2 mg/L — AB (ref 5.7–26.3)
Kappa/Lambda FluidC Ratio: 1.93 — ABNORMAL HIGH (ref 0.26–1.65)

## 2017-01-20 ENCOUNTER — Other Ambulatory Visit (HOSPITAL_COMMUNITY): Payer: Self-pay

## 2017-01-20 ENCOUNTER — Ambulatory Visit (INDEPENDENT_AMBULATORY_CARE_PROVIDER_SITE_OTHER): Payer: Medicare Other | Admitting: *Deleted

## 2017-01-20 DIAGNOSIS — Z7901 Long term (current) use of anticoagulants: Secondary | ICD-10-CM

## 2017-01-20 DIAGNOSIS — I82512 Chronic embolism and thrombosis of left femoral vein: Secondary | ICD-10-CM

## 2017-01-20 LAB — POCT INR: INR: 2.7

## 2017-01-20 NOTE — Patient Instructions (Signed)
Description   Continue taking 2.5 mg (1/2 tablet) daily except 5 mg(1 tablet) on Fridays. Recheck in 3 weeks. Call if placed on any new medications or if scheduled for any procedures or surgery [phone].

## 2017-01-20 NOTE — Progress Notes (Signed)
Routine [facility] visit scheduled for today.  There's no answer at the door and the phone continues to ring.  I called back several times and the phone was busy.  F/u with pt tomorrow morning due to his pill box should be empty due to our regular visits are on Tuesday.

## 2017-01-21 ENCOUNTER — Encounter (HOSPITAL_COMMUNITY): Payer: Self-pay

## 2017-01-21 ENCOUNTER — Other Ambulatory Visit (HOSPITAL_COMMUNITY): Payer: Self-pay

## 2017-01-21 ENCOUNTER — Other Ambulatory Visit: Payer: Self-pay | Admitting: Family Medicine

## 2017-01-21 ENCOUNTER — Telehealth: Payer: Self-pay | Admitting: *Deleted

## 2017-01-21 LAB — MULTIPLE MYELOMA PANEL, SERUM
ALBUMIN SERPL ELPH-MCNC: 3 g/dL (ref 2.9–4.4)
ALPHA 1: 0.5 g/dL — AB (ref 0.0–0.4)
ALPHA2 GLOB SERPL ELPH-MCNC: 0.9 g/dL (ref 0.4–1.0)
Albumin/Glob SerPl: 0.9 (ref 0.7–1.7)
B-GLOBULIN SERPL ELPH-MCNC: 1.2 g/dL (ref 0.7–1.3)
Gamma Glob SerPl Elph-Mcnc: 1.1 g/dL (ref 0.4–1.8)
Globulin, Total: 3.6 g/dL (ref 2.2–3.9)
IGG (IMMUNOGLOBIN G), SERUM: 893 mg/dL (ref 700–1600)
IgA, Qn, Serum: 485 mg/dL — ABNORMAL HIGH (ref 61–437)
IgM, Qn, Serum: 100 mg/dL (ref 20–172)
TOTAL PROTEIN: 6.6 g/dL (ref 6.0–8.5)

## 2017-01-21 NOTE — Progress Notes (Signed)
Paramedicine Encounter    Patient ID: Leonard [name], male    DOB: [birth year], 66 y.o.   MRN: [id number]    Patient Care Team: [name], MD as PCP - General (Family Medicine) [name], MD as Consulting Physician (Nephrology)  Patient Active Problem List   Diagnosis Date Noted  . Collapse of left talus 01/14/2017  . Diabetic polyneuropathy associated with type 2 diabetes mellitus ([place]) 01/14/2017  . 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 Idiopathic chronic gout of left foot without tophus   . Left foot pain 10/29/2016  . Long term (current) use of anticoagulants [Z79.01] 09/09/2016  . Chronic deep vein thrombosis (DVT) of femoral vein of left lower extremity ([place]) 09/03/2016  . Morbid obesity due to excess calories ([place]) 03/17/2016  . Anemia due to stage 4 chronic kidney disease treated with darbepoetin ([place]) 02/28/2016  . Fatigue associated with anemia 01/21/2016  . Diabetic retinopathy ([place]) 11/13/2015  . CHF (congestive heart failure) ([place]) 08/27/2015  . Iron deficiency anemia 08/03/2015  . Claudication ([place])   . Diabetes ([place])   . Hematuria 06/27/2015  . Skin lesion of face 02/22/2015  . 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 Deficiency anemia 01/15/2015  . CKD (chronic kidney disease) stage 4, GFR 15-29 ml/min (H[facility]) 01/15/2015  . Hypothyroidism 11/22/2014  . COPD GOLD III  02/16/2014  . History of tobacco use 08/23/2013  . 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 CAD (coronary artery disease) 04/06/2013  . PVD - hx of Rt SFA PTA and s/p Rt 4-5th toe amp Dec 2014 04/05/2013  . Essential hypertension 11/25/2012  . Mixed hyperlipidemia 07/17/2008    Current Outpatient Medications:  .  allopurinol (ZYLOPRIM) 100 MG tablet, Take 1 tablet (100 mg total) by mouth 2 (two) times daily., Disp: 60 tablet, Rfl: 0 .  amLODipine (NORVASC) 10 MG tablet, Take 1 tablet (10 mg total) by mouth daily., Disp: 30 tablet, Rfl:  6 .  aspirin 81 MG chewable tablet, Chew 81 mg by mouth daily., Disp: , Rfl:  .  atorvastatin (LIPITOR) 40 MG tablet, TAKE ONE (1) TABLET BY MOUTH EVERY DAY AT 6:00PM (Patient taking differently: Take 40 mg by mouth once a day at 6 PM), Disp: 90 tablet, Rfl: 3 .  carvedilol (COREG) 25 MG tablet, Take 2 tablets (50 mg total) by mouth 2 (two) times daily with a meal., Disp: 120 tablet, Rfl: 3 .  carvedilol (COREG) 25 MG tablet, TAKE TWO (2) TABLETS BY MOUTH 2 TIMES DAILY WITH A MEAL, Disp: 120 tablet, Rfl: 3 .  cloNIDine (CATAPRES) 0.2 MG tablet, TAKE TWO (2) TABLETS BY MOUTH 2 TIMES DAILY (Patient taking differently: Take 0.4 mg by mouth two times a day), Disp: 120 tablet, Rfl: 3 .  Colchicine 0.6 MG CAPS, TAKE ONE (1) CAPSULE BY MOUTH 2 TIMES DAILY, Disp: 30 capsule, Rfl: 0 .  doxazosin (CARDURA) 8 MG tablet, Take 1 tablet (8 mg total) by mouth every evening., Disp: , Rfl:  .  ferrous sulfate 325 (65 FE) MG tablet, TAKE ONE (1) TABLET BY MOUTH TWO (2) TIMES DAILY WITH A MEAL (Patient taking differently: Take 325 mg by mouth two times a day with a meal), Disp: 60 tablet, Rfl: 3 .  hydrALAZINE (APRESOLINE) 100 MG tablet, TAKE ONE TABLET BY MOUTH EVERY EIGHT HOURS (Patient taking differently: Take 100 mg by mouth every 8 hours), Disp: 90 tablet, Rfl: 1 .  insulin aspart (NOVOLOG FLEXPEN) 100 UNIT/ML  FlexPen, Inject 15 Units 2 (two) times daily before lunch and supper into the skin., Disp: 15 mL, Rfl: 5 .  Insulin Glargine (LANTUS SOLOSTAR) 100 UNIT/ML Solostar Pen, Inject 50 Units into the skin every morning. (Patient taking differently: Inject 50 Units into the skin daily after breakfast. ), Disp: 15 mL, Rfl: 3 .  isosorbide mononitrate (IMDUR) 60 MG 24 hr tablet, Take 60 mg by mouth daily., Disp: , Rfl:  .  isosorbide mononitrate (IMDUR) 60 MG 24 hr tablet, TAKE ONE (1) TABLET BY MOUTH EVERY DAY, Disp: 30 tablet, Rfl: 3 .  levothyroxine (SYNTHROID, LEVOTHROID) 75 MCG tablet, Take 1 tablet (75 mcg total)  by mouth daily., Disp: 90 tablet, Rfl: 1 .  pantoprazole (PROTONIX) 40 MG tablet, TAKE ONE (1) TABLET BY MOUTH EVERY DAY, Disp: 90 tablet, Rfl: 0 .  potassium chloride SA (K-DUR,KLOR-CON) 20 MEQ tablet, Take 20 mEq by mouth daily., Disp: , Rfl:  .  torsemide (DEMADEX) 20 MG tablet, Take 3 tablets (60 mg total) by mouth daily. May take an extra 20 mg Daily for weight gain above 212 lbs (Patient taking differently: Take 20 mg by mouth 3 (three) times daily. And may take an extra 20 mg daily for weight gain above 212 lbs), Disp: 90 tablet, Rfl: 6 .  traMADol (ULTRAM) 50 MG tablet, Take 1 tablet (50 mg total) every 8 (eight) hours as needed by mouth (for pain)., Disp: 30 tablet, Rfl: 0 .  warfarin (COUMADIN) 5 MG tablet, Take 2.5 mg (1/2 tablet) daily except 5 mg (1 tablet) on Monday and Friday, Disp: 18 tablet, Rfl: 1 .  Blood Glucose Monitoring Suppl (ONETOUCH VERIO FLEX SYSTEM) w/Device KIT, 1 kit by Does not apply route daily., Disp: 1 kit, Rfl: 3 .  Darbepoetin Alfa (ARANESP) 500 MCG/ML SOSY injection, Inject 500 mcg into the skin every 14 (fourteen) days. , Disp: , Rfl:  .  enoxaparin (LOVENOX) 100 MG/ML injection, Inject 0.9 mLs (90 mg total) into the skin daily. Please inject ONLY 0.9 mLs (16m total) on 12/04/16. Do NOT use whole syringe, Disp: 0.9 mL, Rfl: 0 .  ferumoxytol 510 mg in sodium chloride 0.9 % 100 mL, Inject into the vein every 7 (seven) days. , Disp: , Rfl:  .  glucose blood (ONETOUCH VERIO) test strip, Use as instructed to test three times daily. ICD-10 code: E11.22, Disp: 100 each, Rfl: 12 .  nitroGLYCERIN (NITROSTAT) 0.4 MG SL tablet, Place 1 tablet (0.4 mg total) under the tongue every 5 (five) minutes as needed for chest pain., Disp: 30 tablet, Rfl: 12 .  ONETOUCH DELICA LANCETS 398XMISC, 1 each by Does not apply route 3 (three) times daily. ICD-10 code: E11.22, Disp: 100 each, Rfl: 12 No current facility-administered medications for this visit.   Facility-Administered  Medications Ordered in Other Visits:  .  sodium chloride flush (NS) 0.9 % injection 10 mL, 10 mL, Intracatheter, PRN, GAlvy Bimler Ni, MD No Known Allergies   Social History   Socioeconomic History  . Marital status: Single    Spouse name: Not on file  . Number of children: Not on file  . Years of education: Not on file  . Highest education level: Not on file  Social Needs  . Financial resource strain: Not on file  . Food insecurity - worry: Not on file  . Food insecurity - inability: Not on file  . Transportation needs - medical: Not on file  . Transportation needs - non-medical: Not on file  Occupational History  .  Not on file  Tobacco Use  . Smoking status: Former Smoker    Packs/day: 1.00    Years: 46.00    Pack years: 46.00    Types: Cigarettes    Start date: 02/04/1967    Last attempt to quit: 02/03/2013    Years since quitting: 3.9  . Smokeless tobacco: Never Used  . Tobacco comment: Doing well with quitting.  Substance and Sexual Activity  . Alcohol use: No    Alcohol/week: 0.0 oz  . Drug use: No  . Sexual activity: Not Currently  Other Topics Concern  . Not on file  Social History Narrative   Lives with sister, [name] in [place].  Retired - former Sports coach.    Physical Exam  Pulmonary/Chest: No respiratory distress. He has no wheezes. He has no rales.  Abdominal: He exhibits no distension. There is no tenderness.  Genitourinary: No penile tenderness.  Musculoskeletal: He exhibits edema.  Left foot swollen and discolored/chronic hx of same  Skin: Skin is warm and dry. He is not diaphoretic.        Future Appointments  Date Time Provider [place]  01/30/2017 11:00 AM [facility] LAB 6 [facility] None  01/30/2017 11:30 AM [facility] I26 DNS [facility] None  01/30/2017  3:00 PM [facility] I27 DNS [facility] None  02/04/2017 10:15 AM Gardiner Barefoot, DPM [facility]  02/05/2017 10:30 AM Newt Minion, MD [facility] None  02/10/2017   3:15 PM [facility]  02/13/2017 11:00 AM [facility] ONLY [facility] None  02/13/2017 11:30 AM [facility] E16 [facility] None  02/13/2017  3:00 PM [facility] I27 DNS [facility] None  02/27/2017 11:30 AM [facility] ONLY [facility] None  02/27/2017 12:00 PM [facility] I26 DNS [facility] None  02/27/2017  3:30 PM [facility] J32 DNS [facility] None  03/13/2017 11:30 AM [facility] ONLY [facility] None  03/13/2017 12:00 PM [facility] B6 [facility] None  03/13/2017  3:30 PM [facility] INJ NURSE [facility] None  03/27/2017 11:15 AM [facility] LAB 2 [facility] None  03/27/2017 11:45 AM Owens Shark, NP [facility] None  03/27/2017 12:45 PM [facility] B6 [facility] None  03/27/2017  4:00 PM [facility] INJ NURSE [facility] None  04/10/2017 11:30 AM [facility] ONLY [facility] None  04/10/2017 12:00 PM [facility] A1 [facility] None  04/10/2017  3:30 PM [facility] INJ NURSE [facility] None  04/24/2017 11:30 AM [facility] LAB 5 [facility] None  04/24/2017 12:00 PM [facility] A3 [facility] None  04/24/2017  4:00 PM [facility] J32 DNS [facility] None    ATF pt CAO x4 sitting in the recliner c/o left foot pain. Pt has hx of same/he took a tramadol prior to my arrival. Pt's aide was leaving when I arrived.  Pt stated that he hadn't taken his morning medications yet because he hadn't eaten breakfast yet.  There's food cooking on the stove at present.  Pt has taken all of his meds this week besides two hydralazine in the afternoon slots.  Pt denies bleeding, sob, dizziness and chest pain.  Pt hasn't weighed yet he stated that his foot hurts, so he will call me later with his results.  During our assessment, his blood sugar is reading HI.  He then told me that he ate a entire bag of "orange slices last night into this morning'; he did not take his evening insulin and no lantus in two days.  Pt's pill box refilled and rx bottles verified.    I called dr.Gambino  office to advise them of pt's CBG reading.  I spoke with Lauren who advised me that he should go ahead and take his usual 50 units of lantus now.  She will talk to dr. Juanito Doom about what to do and she will call him with any further if needed.  I advised pt of the same. He took the 50 units of lantus while I was still on scene.  I explained to the family of pt's current vitals and the signs that they to look out if he becomes hyperglycemic.  they were advised to call 911 if he is hard to wake up or if he goes unresponsive.    BP 138/70 (BP Location: Right Arm, Patient Position: Sitting, Cuff Size: Normal)   Pulse 67   Resp 16   SpO2 98%  cbg HI  **warfarin placed in pill box according to last INR check 12/18 *RX called in: Allopurinol Asa (filled until sat/none in sun-wed) Hydralazine Atorvastatin Ferrous sulfate levothyrodixine    Love Milbourne, EMT Paramedic 01/21/2017    ACTION: Home visit completed Next visit planned for next week

## 2017-01-21 NOTE — Telephone Encounter (Signed)
Dee Copper, EMT with Paramedicine called from patient's home to report CBG > 600. Patient states took last dose of lantus 2 days ago and did not take evening dose of novolog yesterday. Patient is having increased voiding this AM, no edema noted by [facility]. Patient has yet to take today's weight. BP 138/70 HR 67 SpO2 98%  Patient has been taking tramadol in the morning before his other meds then falls asleep in recliner without taking meds. Karena Addison has instructed patient to wait to take tramadol till after morning weight, blood sugar and all meds have been taken.   Karena Addison may be reached at [phone] for any questions or instrucions.  Hubbard Hartshorn, RN, BSN    .

## 2017-01-28 ENCOUNTER — Other Ambulatory Visit (HOSPITAL_COMMUNITY): Payer: Self-pay

## 2017-01-28 ENCOUNTER — Encounter (HOSPITAL_COMMUNITY): Payer: Self-pay

## 2017-01-28 NOTE — Progress Notes (Signed)
Paramedicine Encounter    Patient ID: Leonard [name], male    DOB: 09/20/1950, 66 y.o.   MRN: [id number]    Patient Care Team: [name], MD as PCP - General (Family Medicine) [name], MD as Consulting Physician (Nephrology)  Patient Active Problem List   Diagnosis Date Noted  . Collapse of left talus 01/14/2017  . Diabetic polyneuropathy associated with type 2 diabetes mellitus ([place]) 01/14/2017  . 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 Idiopathic chronic gout of left foot without tophus   . Left foot pain 10/29/2016  . Long term (current) use of anticoagulants [Z79.01] 09/09/2016  . Chronic deep vein thrombosis (DVT) of femoral vein of left lower extremity ([place]) 09/03/2016  . Morbid obesity due to excess calories ([place]) 03/17/2016  . Anemia due to stage 4 chronic kidney disease treated with darbepoetin ([place]) 02/28/2016  . Fatigue associated with anemia 01/21/2016  . Diabetic retinopathy ([place]) 11/13/2015  . CHF (congestive heart failure) ([place]) 08/27/2015  . Iron deficiency anemia 08/03/2015  . Claudication ([place])   . Diabetes ([place])   . Hematuria 06/27/2015  . Skin lesion of face 02/22/2015  . 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 Deficiency anemia 01/15/2015  . CKD (chronic kidney disease) stage 4, GFR 15-29 ml/min ([facility]) 01/15/2015  . Hypothyroidism 11/22/2014  . COPD GOLD III  02/16/2014  . History of tobacco use 08/23/2013  . 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 CAD (coronary artery disease) 04/06/2013  . PVD - hx of Rt SFA PTA and s/p Rt 4-5th toe amp Dec 2014 04/05/2013  . Essential hypertension 11/25/2012  . Mixed hyperlipidemia 07/17/2008    Current Outpatient Medications:  .  allopurinol (ZYLOPRIM) 100 MG tablet, TAKE ONE TABLET TWICE DAILY, Disp: 60 tablet, Rfl: 0 .  amLODipine (NORVASC) 10 MG tablet, Take 1 tablet (10 mg total) by mouth daily., Disp: 30 tablet, Rfl: 6 .  atorvastatin (LIPITOR) 40  MG tablet, TAKE ONE (1) TABLET BY MOUTH EVERY DAY AT 6:00PM (Patient taking differently: Take 40 mg by mouth once a day at 6 PM), Disp: 90 tablet, Rfl: 3 .  carvedilol (COREG) 25 MG tablet, Take 2 tablets (50 mg total) by mouth 2 (two) times daily with a meal., Disp: 120 tablet, Rfl: 3 .  cloNIDine (CATAPRES) 0.2 MG tablet, TAKE TWO (2) TABLETS BY MOUTH 2 TIMES DAILY (Patient taking differently: Take 0.4 mg by mouth two times a day), Disp: 120 tablet, Rfl: 3 .  Colchicine 0.6 MG CAPS, TAKE ONE (1) CAPSULE BY MOUTH 2 TIMES DAILY, Disp: 30 capsule, Rfl: 0 .  doxazosin (CARDURA) 8 MG tablet, Take 1 tablet (8 mg total) by mouth every evening., Disp: , Rfl:  .  ferrous sulfate 325 (65 FE) MG tablet, TAKE ONE (1) TABLET BY MOUTH TWO (2) TIMES DAILY WITH A MEAL (Patient taking differently: Take 325 mg by mouth two times a day with a meal), Disp: 60 tablet, Rfl: 3 .  hydrALAZINE (APRESOLINE) 100 MG tablet, TAKE ONE TABLET BY MOUTH EVERY EIGHT HOURS (Patient taking differently: Take 100 mg by mouth every 8 hours), Disp: 90 tablet, Rfl: 1 .  insulin aspart (NOVOLOG FLEXPEN) 100 UNIT/ML FlexPen, Inject 15 Units 2 (two) times daily before lunch and supper into the skin., Disp: 15 mL, Rfl: 5 .  Insulin Glargine (LANTUS SOLOSTAR) 100 UNIT/ML Solostar Pen, Inject 50 Units into the skin every morning. (Patient taking differently: Inject 50 Units into the skin daily  after breakfast. ), Disp: 15 mL, Rfl: 3 .  isosorbide mononitrate (IMDUR) 60 MG 24 hr tablet, Take 60 mg by mouth daily., Disp: , Rfl:  .  levothyroxine (SYNTHROID, LEVOTHROID) 75 MCG tablet, Take 1 tablet (75 mcg total) by mouth daily., Disp: 90 tablet, Rfl: 1 .  nitroGLYCERIN (NITROSTAT) 0.4 MG SL tablet, Place 1 tablet (0.4 mg total) under the tongue every 5 (five) minutes as needed for chest pain., Disp: 30 tablet, Rfl: 12 .  pantoprazole (PROTONIX) 40 MG tablet, TAKE ONE (1) TABLET BY MOUTH EVERY DAY, Disp: 90 tablet, Rfl: 0 .  potassium chloride SA  (K-DUR,KLOR-CON) 20 MEQ tablet, Take 20 mEq by mouth daily., Disp: , Rfl:  .  torsemide (DEMADEX) 20 MG tablet, Take 3 tablets (60 mg total) by mouth daily. May take an extra 20 mg Daily for weight gain above 212 lbs (Patient taking differently: Take 20 mg by mouth 3 (three) times daily. And may take an extra 20 mg daily for weight gain above 212 lbs), Disp: 90 tablet, Rfl: 6 .  traMADol (ULTRAM) 50 MG tablet, TAKE ONE TABLET BY MOUTH EVERY EIGHT HOURS AS NEEDED, Disp: 30 tablet, Rfl: 1 .  warfarin (COUMADIN) 5 MG tablet, Take 2.5 mg (1/2 tablet) daily except 5 mg (1 tablet) on Monday and Friday, Disp: 18 tablet, Rfl: 1 .  aspirin 81 MG chewable tablet, Chew 81 mg by mouth daily., Disp: , Rfl:  .  Blood Glucose Monitoring Suppl (ONETOUCH VERIO FLEX SYSTEM) w/Device KIT, 1 kit by Does not apply route daily., Disp: 1 kit, Rfl: 3 .  carvedilol (COREG) 25 MG tablet, TAKE TWO (2) TABLETS BY MOUTH 2 TIMES DAILY WITH A MEAL, Disp: 120 tablet, Rfl: 3 .  Darbepoetin Alfa (ARANESP) 500 MCG/ML SOSY injection, Inject 500 mcg into the skin every 14 (fourteen) days. , Disp: , Rfl:  .  enoxaparin (LOVENOX) 100 MG/ML injection, Inject 0.9 mLs (90 mg total) into the skin daily. Please inject ONLY 0.9 mLs (51m total) on 12/04/16. Do NOT use whole syringe, Disp: 0.9 mL, Rfl: 0 .  ferumoxytol 510 mg in sodium chloride 0.9 % 100 mL, Inject into the vein every 7 (seven) days. , Disp: , Rfl:  .  glucose blood (ONETOUCH VERIO) test strip, Use as instructed to test three times daily. ICD-10 code: E11.22, Disp: 100 each, Rfl: 12 .  isosorbide mononitrate (IMDUR) 60 MG 24 hr tablet, TAKE ONE (1) TABLET BY MOUTH EVERY DAY, Disp: 30 tablet, Rfl: 3 .  ONETOUCH DELICA LANCETS 346OMISC, 1 each by Does not apply route 3 (three) times daily. ICD-10 code: E11.22, Disp: 100 each, Rfl: 12 No current facility-administered medications for this visit.   Facility-Administered Medications Ordered in Other Visits:  .  sodium chloride flush  (NS) 0.9 % injection 10 mL, 10 mL, Intracatheter, PRN, GAlvy Bimler Ni, MD No Known Allergies   Social History   Socioeconomic History  . Marital status: Single    Spouse name: Not on file  . Number of children: Not on file  . Years of education: Not on file  . Highest education level: Not on file  Social Needs  . Financial resource strain: Not on file  . Food insecurity - worry: Not on file  . Food insecurity - inability: Not on file  . Transportation needs - medical: Not on file  . Transportation needs - non-medical: Not on file  Occupational History  . Not on file  Tobacco Use  . Smoking status: Former  Smoker    Packs/day: 1.00    Years: 46.00    Pack years: 46.00    Types: Cigarettes    Start date: 02/04/1967    Last attempt to quit: 02/03/2013    Years since quitting: 3.9  . Smokeless tobacco: Never Used  . Tobacco comment: Doing well with quitting.  Substance and Sexual Activity  . Alcohol use: No    Alcohol/week: 0.0 oz  . Drug use: No  . Sexual activity: Not Currently  Other Topics Concern  . Not on file  Social History Narrative   Lives with sister, [name] in [place].  Retired - former Sports coach.    Physical Exam  Pulmonary/Chest: No respiratory distress. He has no wheezes.  Abdominal: Soft. He exhibits no distension. There is tenderness. There is no guarding.  Musculoskeletal: He exhibits edema.  Edema noted to left foot and leg (chronic hx of same)  Skin: Skin is warm and dry. He is not diaphoretic.        Future Appointments  Date Time Provider [place]  01/30/2017 11:00 AM [facility] LAB 6 [facility] None  01/30/2017 11:30 AM [facility] D12 [facility] None  01/30/2017  3:00 PM [facility] I27 DNS [facility] None  02/04/2017 10:15 AM Gardiner Barefoot, DPM [facility]  02/05/2017 10:30 AM Newt Minion, MD [facility] None  02/10/2017  3:15 PM [facility]  02/13/2017 11:00 AM [facility] ONLY  [facility] None  02/13/2017 11:30 AM [facility] E16 [facility] None  02/13/2017  3:00 PM [facility] I27 DNS [facility] None  02/27/2017 11:30 AM [facility] ONLY [facility] None  02/27/2017 12:00 PM [facility] I26 DNS [facility] None  02/27/2017  3:30 PM [facility] J32 DNS [facility] None  03/13/2017 11:30 AM [facility] ONLY [facility] None  03/13/2017 12:00 PM [facility] B6 [facility] None  03/13/2017  3:30 PM [facility] INJ NURSE [facility] None  03/27/2017 11:15 AM [facility] LAB 2 [facility] None  03/27/2017 11:45 AM Owens Shark, NP [facility] None  03/27/2017 12:45 PM [facility] B6 [facility] None  03/27/2017  4:00 PM [facility] INJ NURSE [facility] None  04/10/2017 11:30 AM [facility] ONLY [facility] None  04/10/2017 12:00 PM [facility] A1 [facility] None  04/10/2017  3:30 PM [facility] INJ NURSE [facility] None  04/24/2017 11:30 AM [facility] LAB 5 [facility] None  04/24/2017 12:00 PM [facility] A3 [facility] None  04/24/2017  4:00 PM [facility] J32 DNS [facility] None    ATF pt CAO x4 being cleaned by his home aid; pt had an "accident on himself". Pt has had issues with his bowels a couple of weeks back also, which symptoms resolved without intervention.  Pt stated that he called his doctor and he was told to take pepto bismol, which he took about a hour ago. He does not want me to call ems or his physician.  Pt stated that he should start to feel better in a "few mins".  Pt denies vomiting and he didn't notice blood in his bowel movement. Pt has lower abdominal pain mid section right below his belly button.  He has been eating a spice cake this morning and possibly yesterday.  Pt has missed several medication doses within this past week.  He missed last wed and sat mornings and several evening doses/all of his afternoon hydralazine.  Pt hasn't weighed today or yesterday. He stated that he doesn't feel like walking to his bedroom and weighing. rx bottles verified and pill  box refilled. Pt is still out of ASA.  I called  the pharmacy and the pharmacist stated that Dr. Lilla Shook office advised that pt no longer need to take a Asprin once a day.  Pt took this morning meds during this visit.  BP (!) 150/60 (BP Location: Right Arm, Patient Position: Sitting, Cuff Size: Normal)   Pulse 97   Resp 16   SpO2 98%   CBG 95 ** warfarin placed according topts last INR check 12/18 *RX called in: Asa (called in last week but the physician advised the pharmacist that asa is no long needed) Warfarin Levothyroxine  Weight yesterday-didn't weigh Last visit weight-didn't weigh    Sade Mehlhoff, EMT Paramedic 01/28/2017    ACTION: Home visit completed

## 2017-01-30 ENCOUNTER — Ambulatory Visit (HOSPITAL_BASED_OUTPATIENT_CLINIC_OR_DEPARTMENT_OTHER): Payer: Medicare Other

## 2017-01-30 ENCOUNTER — Other Ambulatory Visit (HOSPITAL_BASED_OUTPATIENT_CLINIC_OR_DEPARTMENT_OTHER): Payer: Medicare Other

## 2017-01-30 ENCOUNTER — Ambulatory Visit: Payer: Medicare Other

## 2017-01-30 VITALS — BP 128/60 | HR 72 | Temp 98.9°F | Resp 16

## 2017-01-30 DIAGNOSIS — D631 Anemia in chronic kidney disease: Secondary | ICD-10-CM

## 2017-01-30 DIAGNOSIS — N184 Chronic kidney disease, stage 4 (severe): Secondary | ICD-10-CM

## 2017-01-30 DIAGNOSIS — D472 Monoclonal gammopathy: Secondary | ICD-10-CM | POA: Diagnosis not present

## 2017-01-30 DIAGNOSIS — N189 Chronic kidney disease, unspecified: Secondary | ICD-10-CM

## 2017-01-30 DIAGNOSIS — D649 Anemia, unspecified: Secondary | ICD-10-CM

## 2017-01-30 LAB — CBC WITH DIFFERENTIAL/PLATELET
BASO%: 0.2 % (ref 0.0–2.0)
BASOS ABS: 0 10*3/uL (ref 0.0–0.1)
EOS ABS: 0.1 10*3/uL (ref 0.0–0.5)
EOS%: 1.8 % (ref 0.0–7.0)
HCT: 24.1 % — ABNORMAL LOW (ref 38.4–49.9)
HGB: 7.3 g/dL — ABNORMAL LOW (ref 13.0–17.1)
LYMPH%: 16.3 % (ref 14.0–49.0)
MCH: 23.3 pg — AB (ref 27.2–33.4)
MCHC: 30.3 g/dL — ABNORMAL LOW (ref 32.0–36.0)
MCV: 77 fL — AB (ref 79.3–98.0)
MONO#: 0.4 10*3/uL (ref 0.1–0.9)
MONO%: 8.2 % (ref 0.0–14.0)
NEUT#: 3.8 10*3/uL (ref 1.5–6.5)
NEUT%: 73.5 % (ref 39.0–75.0)
Platelets: 249 10*3/uL (ref 140–400)
RBC: 3.13 10*6/uL — AB (ref 4.20–5.82)
RDW: 19.9 % — ABNORMAL HIGH (ref 11.0–14.6)
WBC: 5.1 10*3/uL (ref 4.0–10.3)
lymph#: 0.8 10*3/uL — ABNORMAL LOW (ref 0.9–3.3)

## 2017-01-30 LAB — PREPARE RBC (CROSSMATCH)

## 2017-01-30 MED ORDER — ACETAMINOPHEN 325 MG PO TABS
650.0000 mg | ORAL_TABLET | Freq: Once | ORAL | Status: AC
  Administered 2017-01-30: 650 mg via ORAL

## 2017-01-30 MED ORDER — ACETAMINOPHEN 325 MG PO TABS
ORAL_TABLET | ORAL | Status: AC
  Filled 2017-01-30: qty 2

## 2017-01-30 MED ORDER — SODIUM CHLORIDE 0.9 % IV SOLN
250.0000 mL | Freq: Once | INTRAVENOUS | Status: AC
  Administered 2017-01-30: 250 mL via INTRAVENOUS

## 2017-01-30 MED ORDER — DARBEPOETIN ALFA 500 MCG/ML IJ SOSY
PREFILLED_SYRINGE | INTRAMUSCULAR | Status: AC
  Filled 2017-01-30: qty 1

## 2017-01-30 MED ORDER — DARBEPOETIN ALFA 500 MCG/ML IJ SOSY
500.0000 ug | PREFILLED_SYRINGE | Freq: Once | INTRAMUSCULAR | Status: AC
  Administered 2017-01-30: 500 ug via SUBCUTANEOUS

## 2017-01-30 MED ORDER — DIPHENHYDRAMINE HCL 25 MG PO CAPS
ORAL_CAPSULE | ORAL | Status: AC
  Filled 2017-01-30: qty 1

## 2017-01-30 MED ORDER — DIPHENHYDRAMINE HCL 25 MG PO CAPS
25.0000 mg | ORAL_CAPSULE | Freq: Once | ORAL | Status: AC
  Administered 2017-01-30: 25 mg via ORAL

## 2017-01-30 NOTE — Patient Instructions (Signed)

## 2017-01-31 LAB — TYPE AND SCREEN
ABO/RH(D): B NEG
Antibody Screen: NEGATIVE
Unit division: 0

## 2017-01-31 LAB — BPAM RBC
Blood Product Expiration Date: 201901052359
ISSUE DATE / TIME: 201812281421
UNIT TYPE AND RH: 1700

## 2017-02-04 ENCOUNTER — Other Ambulatory Visit: Payer: Self-pay | Admitting: Family Medicine

## 2017-02-04 ENCOUNTER — Encounter: Payer: Self-pay | Admitting: Podiatry

## 2017-02-04 ENCOUNTER — Other Ambulatory Visit (HOSPITAL_COMMUNITY): Payer: Self-pay

## 2017-02-04 ENCOUNTER — Ambulatory Visit (INDEPENDENT_AMBULATORY_CARE_PROVIDER_SITE_OTHER): Payer: Medicare Other | Admitting: Podiatry

## 2017-02-04 ENCOUNTER — Ambulatory Visit (HOSPITAL_COMMUNITY)
Admission: RE | Admit: 2017-02-04 | Discharge: 2017-02-04 | Disposition: A | Discharge status: Home / Self Care | Payer: Medicare Other | Source: Ambulatory Visit | Attending: Hematology and Oncology | Admitting: Hematology and Oncology

## 2017-02-04 DIAGNOSIS — Q828 Other specified congenital malformations of skin: Secondary | ICD-10-CM | POA: Diagnosis not present

## 2017-02-04 DIAGNOSIS — E1140 Type 2 diabetes mellitus with diabetic neuropathy, unspecified: Secondary | ICD-10-CM

## 2017-02-04 DIAGNOSIS — S98131S Complete traumatic amputation of one right lesser toe, sequela: Secondary | ICD-10-CM

## 2017-02-04 DIAGNOSIS — M79675 Pain in left toe(s): Secondary | ICD-10-CM

## 2017-02-04 DIAGNOSIS — I739 Peripheral vascular disease, unspecified: Secondary | ICD-10-CM

## 2017-02-04 DIAGNOSIS — M79674 Pain in right toe(s): Secondary | ICD-10-CM | POA: Diagnosis not present

## 2017-02-04 DIAGNOSIS — B351 Tinea unguium: Secondary | ICD-10-CM

## 2017-02-04 NOTE — Progress Notes (Signed)
Paramedicine Encounter    Patient ID: Leonard [name], male    DOB: 08-24-1950, 67 y.o.   MRN: [id number]    Patient Care Team: [name], MD as PCP - General (Family Medicine) [name], MD as Consulting Physician (Nephrology)  Patient Active Problem List   Diagnosis Date Noted  . Collapse of left talus 01/14/2017  . Diabetic polyneuropathy associated with type 2 diabetes mellitus ([place]) 01/14/2017  . 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 Idiopathic chronic gout of left foot without tophus   . Left foot pain 10/29/2016  . Long term (current) use of anticoagulants [Z79.01] 09/09/2016  . Chronic deep vein thrombosis (DVT) of femoral vein of left lower extremity ([place]) 09/03/2016  . Morbid obesity due to excess calories ([place]) 03/17/2016  . Anemia due to stage 4 chronic kidney disease treated with darbepoetin ([place]) 02/28/2016  . Fatigue associated with anemia 01/21/2016  . Diabetic retinopathy ([place]) 11/13/2015  . CHF (congestive heart failure) ([place]) 08/27/2015  . Iron deficiency anemia 08/03/2015  . Claudication ([place])   . Diabetes ([place])   . Hematuria 06/27/2015  . Skin lesion of face 02/22/2015  . 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 Deficiency anemia 01/15/2015  . CKD (chronic kidney disease) stage 4, GFR 15-29 ml/min (HCC) 01/15/2015  . Hypothyroidism 11/22/2014  . COPD GOLD III  02/16/2014  . History of tobacco use 08/23/2013  . 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 CAD (coronary artery disease) 04/06/2013  . PVD - hx of Rt SFA PTA and s/p Rt 4-5th toe amp Dec 2014 04/05/2013  . Essential hypertension 11/25/2012  . Mixed hyperlipidemia 07/17/2008    Current Outpatient Medications:  .  allopurinol (ZYLOPRIM) 100 MG tablet, TAKE ONE TABLET TWICE DAILY, Disp: 60 tablet, Rfl: 0 .  amLODipine (NORVASC) 10 MG tablet, Take 1 tablet (10 mg total) by mouth daily., Disp: 30 tablet, Rfl: 6 .  atorvastatin (LIPITOR) 40  MG tablet, TAKE ONE (1) TABLET BY MOUTH EVERY DAY AT 6:00PM (Patient taking differently: Take 40 mg by mouth once a day at 6 PM), Disp: 90 tablet, Rfl: 3 .  carvedilol (COREG) 25 MG tablet, Take 2 tablets (50 mg total) by mouth 2 (two) times daily with a meal., Disp: 120 tablet, Rfl: 3 .  cloNIDine (CATAPRES) 0.2 MG tablet, TAKE TWO (2) TABLETS BY MOUTH 2 TIMES DAILY (Patient taking differently: Take 0.4 mg by mouth two times a day), Disp: 120 tablet, Rfl: 3 .  Colchicine 0.6 MG CAPS, TAKE ONE (1) CAPSULE BY MOUTH 2 TIMES DAILY, Disp: 30 capsule, Rfl: 0 .  doxazosin (CARDURA) 8 MG tablet, Take 1 tablet (8 mg total) by mouth every evening., Disp: , Rfl:  .  ferrous sulfate 325 (65 FE) MG tablet, TAKE ONE (1) TABLET BY MOUTH TWO (2) TIMES DAILY WITH A MEAL (Patient taking differently: Take 325 mg by mouth two times a day with a meal), Disp: 60 tablet, Rfl: 3 .  hydrALAZINE (APRESOLINE) 100 MG tablet, TAKE ONE TABLET BY MOUTH EVERY EIGHT HOURS (Patient taking differently: Take 100 mg by mouth every 8 hours), Disp: 90 tablet, Rfl: 1 .  insulin aspart (NOVOLOG FLEXPEN) 100 UNIT/ML FlexPen, Inject 15 Units 2 (two) times daily before lunch and supper into the skin., Disp: 15 mL, Rfl: 5 .  Insulin Glargine (LANTUS SOLOSTAR) 100 UNIT/ML Solostar Pen, Inject 50 Units into the skin every morning. (Patient taking differently: Inject 50 Units into the skin daily  after breakfast. ), Disp: 15 mL, Rfl: 3 .  isosorbide mononitrate (IMDUR) 60 MG 24 hr tablet, Take 60 mg by mouth daily., Disp: , Rfl:  .  isosorbide mononitrate (IMDUR) 60 MG 24 hr tablet, TAKE ONE (1) TABLET BY MOUTH EVERY DAY, Disp: 30 tablet, Rfl: 3 .  levothyroxine (SYNTHROID, LEVOTHROID) 75 MCG tablet, Take 1 tablet (75 mcg total) by mouth daily., Disp: 90 tablet, Rfl: 1 .  pantoprazole (PROTONIX) 40 MG tablet, TAKE ONE (1) TABLET BY MOUTH EVERY DAY, Disp: 90 tablet, Rfl: 0 .  potassium chloride SA (K-DUR,KLOR-CON) 20 MEQ tablet, Take 20 mEq by mouth  daily., Disp: , Rfl:  .  torsemide (DEMADEX) 20 MG tablet, Take 3 tablets (60 mg total) by mouth daily. May take an extra 20 mg Daily for weight gain above 212 lbs (Patient taking differently: Take 20 mg by mouth 3 (three) times daily. And may take an extra 20 mg daily for weight gain above 212 lbs), Disp: 90 tablet, Rfl: 6 .  traMADol (ULTRAM) 50 MG tablet, TAKE ONE TABLET BY MOUTH EVERY EIGHT HOURS AS NEEDED, Disp: 30 tablet, Rfl: 1 .  warfarin (COUMADIN) 5 MG tablet, Take 2.5 mg (1/2 tablet) daily except 5 mg (1 tablet) on Monday and Friday, Disp: 18 tablet, Rfl: 1 .  aspirin 81 MG chewable tablet, Chew 81 mg by mouth daily., Disp: , Rfl:  .  Blood Glucose Monitoring Suppl (ONETOUCH VERIO FLEX SYSTEM) w/Device KIT, 1 kit by Does not apply route daily., Disp: 1 kit, Rfl: 3 .  carvedilol (COREG) 25 MG tablet, TAKE TWO (2) TABLETS BY MOUTH 2 TIMES DAILY WITH A MEAL, Disp: 120 tablet, Rfl: 3 .  Darbepoetin Alfa (ARANESP) 500 MCG/ML SOSY injection, Inject 500 mcg into the skin every 14 (fourteen) days. , Disp: , Rfl:  .  enoxaparin (LOVENOX) 100 MG/ML injection, Inject 0.9 mLs (90 mg total) into the skin daily. Please inject ONLY 0.9 mLs (76m total) on 12/04/16. Do NOT use whole syringe, Disp: 0.9 mL, Rfl: 0 .  ferumoxytol 510 mg in sodium chloride 0.9 % 100 mL, Inject into the vein every 7 (seven) days. , Disp: , Rfl:  .  glucose blood (ONETOUCH VERIO) test strip, Use as instructed to test three times daily. ICD-10 code: E11.22, Disp: 100 each, Rfl: 12 .  nitroGLYCERIN (NITROSTAT) 0.4 MG SL tablet, Place 1 tablet (0.4 mg total) under the tongue every 5 (five) minutes as needed for chest pain., Disp: 30 tablet, Rfl: 12 .  ONETOUCH DELICA LANCETS 369VMISC, 1 each by Does not apply route 3 (three) times daily. ICD-10 code: E11.22, Disp: 100 each, Rfl: 12 No current facility-administered medications for this visit.   Facility-Administered Medications Ordered in Other Visits:  .  sodium chloride flush (NS)  0.9 % injection 10 mL, 10 mL, Intracatheter, PRN, GAlvy Bimler Ni, MD No Known Allergies   Social History   Socioeconomic History  . Marital status: Single    Spouse name: Not on file  . Number of children: Not on file  . Years of education: Not on file  . Highest education level: Not on file  Social Needs  . Financial resource strain: Not on file  . Food insecurity - worry: Not on file  . Food insecurity - inability: Not on file  . Transportation needs - medical: Not on file  . Transportation needs - non-medical: Not on file  Occupational History  . Not on file  Tobacco Use  . Smoking status: Former  Smoker    Packs/day: 1.00    Years: 46.00    Pack years: 46.00    Types: Cigarettes    Start date: 02/04/1967    Last attempt to quit: 02/03/2013    Years since quitting: 4.0  . Smokeless tobacco: Never Used  . Tobacco comment: Doing well with quitting.  Substance and Sexual Activity  . Alcohol use: No    Alcohol/week: 0.0 oz  . Drug use: No  . Sexual activity: Not Currently  Other Topics Concern  . Not on file  Social History Narrative   Lives with sister, [name] in [place].  Retired - former Sports coach.    Physical Exam  Pulmonary/Chest: No respiratory distress. He has no wheezes. He has no rales.  Abdominal: He exhibits no distension. There is no tenderness. There is no guarding.  Musculoskeletal: He exhibits edema.  Left foot swelling and pain/hx of same  Skin: Skin is warm and dry. He is not diaphoretic.        Future Appointments  Date Time Provider [place]  02/05/2017 10:30 AM Newt Minion, MD [facility] None  02/10/2017  3:15 PM [facility]  02/11/2017  9:50 AM [name], MD [facility] [place]  02/13/2017 11:00 AM [facility] ONLY [facility] None  02/13/2017 11:30 AM [facility] C8 [facility] None  02/13/2017  3:00 PM [facility] I27 DNS [facility] None  02/27/2017 11:30 AM [facility]  None  02/27/2017 12:00 PM [facility] I26 DNS [facility] None  02/27/2017  3:30 PM [facility] J32 DNS [facility] None  03/13/2017 11:30 AM [facility] ONLY [facility] None  03/13/2017 12:00 PM [facility] B6 [facility] None  03/13/2017  3:30 PM [facility] INJ NURSE [facility] None  03/27/2017 11:15 AM [facility] LAB 2 [facility] None  03/27/2017 11:45 AM Owens Shark, NP [facility] None  03/27/2017 12:45 PM [facility] B6 [facility] None  03/27/2017  4:00 PM [facility] INJ NURSE [facility] None  04/10/2017 11:30 AM [facility] None  04/10/2017 12:00 PM [facility] A1 [facility] None  04/10/2017  3:30 PM [facility] INJ NURSE [facility] None  04/24/2017 11:30 AM [facility] LAB 5 [facility] None  04/24/2017 12:00 PM [facility] A3 [facility] None  04/24/2017  4:00 PM [facility] J32 DNS [facility] None  05/06/2017  2:45 PM Gardiner Barefoot, DPM [facility]    ATF pt CAO x4 laying in the bed, he just returned from seeing the physician about his foot.  He stated that he is very tired and his foot his hurting him.  He took tramadol earlier today prior to going to the doctor along with his morning medications.  Pt stated that he was told that his toe on the left foot is not getting much circulation per his physician. Pt has another appointment scheduled for tomorrow.  Pt is also still having issues with diarhea.  Pt has had diarrhea off and on for several weeks. He last told me that his pcp told him to take pepto bismol.  Pt has missed several medication doses this week and most of the afternoon hydralazine.  rx bottles verified and pill box refilled.   **wafarin placed in his pill box according to his last INR check  BP (!) 130/56 (BP Location: Right Arm, Patient Position: Lying left side, Cuff Size: Normal)   Pulse 65   Resp 16   Wt 185 lb (83.9 kg)   SpO2 97%   BMI 28.13 kg/m  cbg 108  *rx called in: Torsemide Clonidine Amlodipine Colchicine  Weight  yesterday-didn't weigh Last visit weight-    Leodan Bolyard, EMT Paramedic 02/04/2017    ACTION: Home visit completed

## 2017-02-04 NOTE — Progress Notes (Signed)
Patient ID: Leonard [name], male   DOB: 04-18-1950, 67 y.o.   MRN: [id number] Complaint:  Visit Type: Patient returns to my office for continued preventative foot care services. Complaint: Patient states" my nails have grown long and thick and become painful to walk and wear shoes" Patient has been diagnosed with DM with amputation 4,5 rays right foot... The patient presents for preventative foot care services. No changes to ROS.  Painful callus on forefoot both feet has occurred.  Patient presents the office for preventative foot care services.  This patient has been under the care of multiple doctors for the last 3 months.  He has been under treatment for a DVT left leg gout, left ankle and a collapsed talus by Dr. Sharol Given.  He says Dr. Sharol Given evaluated and treated him and he is scheduled to return to his office this week.    Podiatric Exam: Vascular: dorsalis pedis and posterior tibial pulses are not  palpable bilateral. Capillary return is immediate.  Cold feet Absence of hair. Cold purplish third toe left foot is noted.   Sensorium: Absent  Semmes Weinstein monofilament test. Normal tactile sensation bilaterally. Nail Exam: Pt has thick disfigured discolored nails with subungual debris noted bilateral entire nail hallux through fifth toenails Ulcer Exam: There is no evidence of ulcer or pre-ulcerative changes or infection. Orthopedic Exam: Muscle tone and strength are WNL. No limitations in general ROM. No crepitus or effusions noted. Foot type and digits show no abnormalities. Bony prominences are unremarkable.Amputation 4,5 digits right foot. There is significant swelling noted to the left lower leg and the left foot.   Skin  peeling noted on the heel of the left foot.  Porokeratosis, sub-1. Right foot and sub-2 left foot.  Diagnosis:  Onychomycosis, , Pain in right toe, pain in left toes.  Diabetic angiopathy and neuropathy with amputation.  Porokeratosis  Sub 1 right foot and sub 2 left  foot.  Treatment & Plan Procedures and Treatment: Consent by patient was obtained for treatment procedures. The patient understood the discussion of treatment and procedures well. All questions were answered thoroughly reviewed. Debridement of mycotic and hypertrophic toenails, 1 through 5 bilateral and clearing of subungual debris. No ulceration, no infection noted. Requests no drill usage.  Debride porokeratosis B/L.  during this visit I only worked on his nails since he is under treatment for his left leg and foot by other doctors.   . I also made him aware of his third toe, left foot that it needs to be evaluated.     Return Visit-Office Procedure: Patient instructed to return to the office for a follow up visit 3 months for continued evaluation and treatment.   Gardiner Barefoot DPM    Gardiner Barefoot DPM

## 2017-02-05 ENCOUNTER — Encounter (INDEPENDENT_AMBULATORY_CARE_PROVIDER_SITE_OTHER): Payer: Self-pay | Admitting: Orthopedic Surgery

## 2017-02-05 ENCOUNTER — Ambulatory Visit (INDEPENDENT_AMBULATORY_CARE_PROVIDER_SITE_OTHER): Payer: Medicare Other | Admitting: Orthopedic Surgery

## 2017-02-05 DIAGNOSIS — E1142 Type 2 diabetes mellitus with diabetic polyneuropathy: Secondary | ICD-10-CM

## 2017-02-05 DIAGNOSIS — I70262 Atherosclerosis of native arteries of extremities with gangrene, left leg: Secondary | ICD-10-CM | POA: Diagnosis not present

## 2017-02-05 MED ORDER — TRAMADOL HCL 50 MG PO TABS: 50.0000 mg | ORAL_TABLET | Freq: Four times a day (QID) | ORAL | 0 refills | Status: DC | PRN

## 2017-02-05 NOTE — Addendum Note (Signed)
Addended by: Rozell Searing L on: 02/05/2017 02:00 PM   Modules accepted: Orders

## 2017-02-05 NOTE — Progress Notes (Addendum)
Office Visit Note   Patient: Leonard [name]           Date of Birth: Nov 28, 1950           MRN: [id number] Visit Date: 02/05/2017              Requested by: [name], MD [place], [place] [address] PCP: [name], MD  No chief complaint on file.     HPI: Patient is a 67 year old gentleman with diabetic insensate neuropathy with peripheral vascular disease who presents with acute gangrenous changes to the left third toe.  Patient states that he did not notice this he complains of pain in the foot he states this was noticed by his primary care physician.  Assessment & Plan: Visit Diagnoses:  1. Diabetic polyneuropathy associated with type 2 diabetes mellitus ([place])   2. Atherosclerosis of native artery of left lower extremity with gangrene [facility])     Plan: We will plan for consultation with vascular vein surgery for both ankle-brachial indices and vascular workup.  Patient has ischemic changes to the left forefoot with diabetic Charcot collapse discussed that patient is not a surgical candidate until his vascular status is improved.  Patient states he notices no improvement with taking the colchicine for his gout.  Will have him discontinue the medication his last uric acid was 7.7.  We will hold off on this medication until his vascular status is better understood.  Follow-Up Instructions: Return in about 3 weeks (around 02/26/2017).   Ortho Exam  Patient is alert, oriented, no adenopathy, well-dressed, normal affect, normal respiratory effort. Examination patient does not have a palpable dorsalis pedis or posterior tibial pulse he does have swelling he has plaque ischemic changes to the third toe.  A Doppler was used and he has a monophasic dorsalis pedis and a biphasic posterior tibial pulse.  There is no cellulitis no drainage no odor no signs of infection no signs of this acute Charcot arthropathy.  Patient has a stable Charcot rocker-bottom  deformity.  Imaging: No results found. No images are attached to the encounter.  Labs: Lab Results  Component Value Date   HGBA1C 6.9 01/05/2017   HGBA1C 11.0 10/10/2016   HGBA1C 11.1 07/01/2016   ESRSEDRATE 117 (H) 12/29/2016   ESRSEDRATE 118 (H) 12/18/2016   ESRSEDRATE 130 (H) 12/01/2016   CRP 7.3 (H) 12/01/2016   CRP <0.5 (L) 04/04/2013   CRP 2.6 (H) 02/16/2013   LABURIC 5.3 12/29/2016   LABURIC 7.7 (H) 12/02/2016   LABURIC 10.9 (H) 09/17/2016   REPTSTATUS 12/06/2016 FINAL 12/01/2016   GRAMSTAIN  01/28/2013    RARE WBC PRESENT, PREDOMINANTLY PMN NO SQUAMOUS EPITHELIAL CELLS SEEN NO ORGANISMS SEEN Performed at [facility]  01/28/2013    RARE WBC PRESENT, PREDOMINANTLY PMN NO SQUAMOUS EPITHELIAL CELLS SEEN NO ORGANISMS SEEN Performed at [facility] 5 DAYS 12/01/2016   LABORGA ESCHERICHIA COLI (A) 01/14/2016    @LABSALLVALUES (HGBA1)@  There is no height or weight on file to calculate BMI.  Orders:  No orders of the defined types were placed in this encounter.  Meds ordered this encounter  Medications  . traMADol (ULTRAM) 50 MG tablet    Sig: Take 1 tablet (50 mg total) by mouth every 6 (six) hours as needed for moderate pain.    Dispense:  60 tablet    Refill:  0     Procedures: No procedures performed  Clinical Data: No additional findings.  ROS:  All other systems negative, except as noted in the HPI. Review of Systems  Objective: Vital Signs: There were no vitals taken for this visit.  Specialty Comments:  No specialty comments available.  PMFS History: Patient Active Problem List   Diagnosis Date Noted  . Collapse of left talus 01/14/2017  . Diabetic polyneuropathy associated with type 2 diabetes mellitus ([place]) 01/14/2017  . 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 Idiopathic chronic gout of left foot without tophus   . Left foot pain 10/29/2016  . Long term  (current) use of anticoagulants [Z79.01] 09/09/2016  . Chronic deep vein thrombosis (DVT) of femoral vein of left lower extremity ([place]) 09/03/2016  . Morbid obesity due to excess calories ([place]) 03/17/2016  . Anemia due to stage 4 chronic kidney disease treated with darbepoetin ([place]) 02/28/2016  . Fatigue associated with anemia 01/21/2016  . Diabetic retinopathy ([place]) 11/13/2015  . CHF (congestive heart failure) ([place]) 08/27/2015  . Iron deficiency anemia 08/03/2015  . Claudication ([place])   . Diabetes ([place])   . Hematuria 06/27/2015  . Skin lesion of face 02/22/2015  . 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 Deficiency anemia 01/15/2015  . CKD (chronic kidney disease) stage 4, GFR 15-29 ml/min (HCC) 01/15/2015  . Hypothyroidism 11/22/2014  . COPD GOLD III  02/16/2014  . History of tobacco use 08/23/2013  . 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 CAD (coronary artery disease) 04/06/2013  . PVD - hx of Rt SFA PTA and s/p Rt 4-5th toe amp Dec 2014 04/05/2013  . Atherosclerosis of native artery of left lower extremity with gangrene ([place]) 02/15/2013  . Essential hypertension 11/25/2012  . Mixed hyperlipidemia 07/17/2008   Past Medical History:  Diagnosis Date  . Cancer ([place])    skin cancer-  . CHF (congestive heart failure) ([place])   . Chronic kidney disease    not on dialysis  . Coronary artery disease   . Gangrene of toe ([place])    right 5th/notes 01/04/2013   . High cholesterol   . Hypertension   . Iron deficiency anemia 08/03/2015  . 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 PAD (peripheral artery disease) ([place])   . Shortness of breath dyspnea    with exertion  . Type II diabetes mellitus (HCC)    Type II    Family History  Problem Relation Age of Onset  . Cancer Mother        lung cancer  . Hypertension Mother   . Cancer Sister        patient thinks it was uterine cancer  . Hypertension Sister   . Heart attack Sister   . Stroke Neg  Hx     Past Surgical History:  Procedure Laterality Date  . AMPUTATION Right 01/28/2013   Procedure: RAY AMPUTATION RIGHT  4th & 5th TOE;  Surgeon: Rosetta Posner, MD;  Location[facility] OR;  Service: Vascular;  Laterality: Right;  . ANGIOPLASTY / STENTING FEMORAL Right 01/13/2013   superficial femoral artery x 2 (3 mm x 60 mm, 4 mm x 60 mm)  . AV FISTULA PLACEMENT Left 02/11/2016   Procedure: LEFT UPPER ARM BRACHIAL CEPHALIC ARTERIOVENOUS (AV) FISTULA CREATION;  Surgeon: Conrad North Omak, MD;  Location: [facility];  Service: Vascular;  Laterality: Left;  . COLONOSCOPY W/ POLYPECTOMY    . CORONARY ARTERY BYPASS GRAFT N/A 04/07/2013   Procedure: CORONARY ARTERY BYPASS GRAFTING (CABG);  Surgeon: Ivin Poot, MD;  Location: [facility];  Service: Open Heart Surgery;  Laterality: N/A;  . INTRAOPERATIVE TRANSESOPHAGEAL ECHOCARDIOGRAM N/A 04/07/2013   Procedure: INTRAOPERATIVE TRANSESOPHAGEAL ECHOCARDIOGRAM;  Surgeon: Ivin Poot, MD;  Location: [facility];  Service: Open Heart Surgery;  Laterality: N/A;  . LEFT HEART CATHETERIZATION WITH CORONARY ANGIOGRAM N/A 04/05/2013   Procedure: LEFT HEART CATHETERIZATION WITH CORONARY ANGIOGRAM;  Surgeon: Peter M Martinique, MD;  Location[facility] CATH LAB;  Service: Cardiovascular;  Laterality: N/A;  . LOWER EXTREMITY ANGIOGRAM Bilateral 01/06/2013   Procedure: LOWER EXTREMITY ANGIOGRAM;  Surgeon: Conrad Shamrock, MD;  Location[facility] LAB;  Service: Cardiovascular;  Laterality: Bilateral;  . LOWER EXTREMITY ANGIOGRAM Right 01/13/2013   Procedure: LOWER EXTREMITY ANGIOGRAM;  Surgeon: Conrad New Alluwe, MD;  Location[facility] C[facility] LAB;  Service: Cardiovascular;  Laterality: Right;  . SKIN CANCER EXCISION  2017  . THROAT SURGERY  1983   polyps  . TONSILLECTOMY AND ADENOIDECTOMY  ~ 1965   Social History   Occupational History  . Not on file  Tobacco Use  . Smoking status: Former Smoker    Packs/day: 1.00    Years: 46.00    Pack years: 46.00    Types: Cigarettes    Start date: 02/04/1967    Last  attempt to quit: 02/03/2013    Years since quitting: 4.0  . Smokeless tobacco: Never Used  . Tobacco comment: Doing well with quitting.  Substance and Sexual Activity  . Alcohol use: No    Alcohol/week: 0.0 oz  . Drug use: No  . Sexual activity: Not Currently

## 2017-02-07 ENCOUNTER — Emergency Department (HOSPITAL_COMMUNITY)
Admission: EM | Admit: 2017-02-07 | Discharge: 2017-02-08 | Disposition: A | Discharge status: Home / Self Care | Payer: Medicare Other | Attending: Emergency Medicine | Admitting: Emergency Medicine

## 2017-02-07 ENCOUNTER — Encounter (HOSPITAL_COMMUNITY): Payer: Self-pay | Admitting: Emergency Medicine

## 2017-02-07 ENCOUNTER — Emergency Department (HOSPITAL_COMMUNITY): Payer: Medicare Other

## 2017-02-07 ENCOUNTER — Other Ambulatory Visit: Payer: Self-pay

## 2017-02-07 DIAGNOSIS — Z951 Presence of aortocoronary bypass graft: Secondary | ICD-10-CM | POA: Insufficient documentation

## 2017-02-07 DIAGNOSIS — Z87891 Personal history of nicotine dependence: Secondary | ICD-10-CM | POA: Insufficient documentation

## 2017-02-07 DIAGNOSIS — R531 Weakness: Secondary | ICD-10-CM | POA: Diagnosis not present

## 2017-02-07 DIAGNOSIS — I129 Hypertensive chronic kidney disease with stage 1 through stage 4 chronic kidney disease, or unspecified chronic kidney disease: Secondary | ICD-10-CM | POA: Diagnosis not present

## 2017-02-07 DIAGNOSIS — I739 Peripheral vascular disease, unspecified: Secondary | ICD-10-CM | POA: Diagnosis not present

## 2017-02-07 DIAGNOSIS — I110 Hypertensive heart disease with heart failure: Secondary | ICD-10-CM | POA: Insufficient documentation

## 2017-02-07 DIAGNOSIS — I2510 Atherosclerotic heart disease of native coronary artery without angina pectoris: Secondary | ICD-10-CM | POA: Diagnosis not present

## 2017-02-07 DIAGNOSIS — Z7901 Long term (current) use of anticoagulants: Secondary | ICD-10-CM | POA: Insufficient documentation

## 2017-02-07 DIAGNOSIS — I509 Heart failure, unspecified: Secondary | ICD-10-CM | POA: Diagnosis not present

## 2017-02-07 DIAGNOSIS — E1122 Type 2 diabetes mellitus with diabetic chronic kidney disease: Secondary | ICD-10-CM | POA: Diagnosis not present

## 2017-02-07 DIAGNOSIS — Z794 Long term (current) use of insulin: Secondary | ICD-10-CM | POA: Insufficient documentation

## 2017-02-07 DIAGNOSIS — N390 Urinary tract infection, site not specified: Secondary | ICD-10-CM | POA: Diagnosis not present

## 2017-02-07 DIAGNOSIS — N184 Chronic kidney disease, stage 4 (severe): Secondary | ICD-10-CM | POA: Insufficient documentation

## 2017-02-07 DIAGNOSIS — R4182 Altered mental status, unspecified: Secondary | ICD-10-CM | POA: Diagnosis present

## 2017-02-07 DIAGNOSIS — I96 Gangrene, not elsewhere classified: Secondary | ICD-10-CM | POA: Diagnosis not present

## 2017-02-07 DIAGNOSIS — M79672 Pain in left foot: Secondary | ICD-10-CM | POA: Diagnosis not present

## 2017-02-07 DIAGNOSIS — R262 Difficulty in walking, not elsewhere classified: Secondary | ICD-10-CM | POA: Diagnosis not present

## 2017-02-07 LAB — URINALYSIS, ROUTINE W REFLEX MICROSCOPIC
Bilirubin Urine: NEGATIVE
Glucose, UA: NEGATIVE mg/dL
Ketones, ur: NEGATIVE mg/dL
Nitrite: NEGATIVE
PH: 5 (ref 5.0–8.0)
Protein, ur: 100 mg/dL — AB
SPECIFIC GRAVITY, URINE: 1.008 (ref 1.005–1.030)

## 2017-02-07 LAB — CBC WITH DIFFERENTIAL/PLATELET
Basophils Absolute: 0 10*3/uL (ref 0.0–0.1)
Basophils Relative: 0 %
EOS ABS: 0.1 10*3/uL (ref 0.0–0.7)
EOS PCT: 1 %
HCT: 25.8 % — ABNORMAL LOW (ref 39.0–52.0)
Hemoglobin: 7.6 g/dL — ABNORMAL LOW (ref 13.0–17.0)
LYMPHS PCT: 11 %
Lymphs Abs: 1 10*3/uL (ref 0.7–4.0)
MCH: 22.4 pg — ABNORMAL LOW (ref 26.0–34.0)
MCHC: 29.5 g/dL — ABNORMAL LOW (ref 30.0–36.0)
MCV: 75.9 fL — AB (ref 78.0–100.0)
MONO ABS: 0.9 10*3/uL (ref 0.1–1.0)
Monocytes Relative: 10 %
Neutro Abs: 6.8 10*3/uL (ref 1.7–7.7)
Neutrophils Relative %: 78 %
PLATELETS: 324 10*3/uL (ref 150–400)
RBC: 3.4 MIL/uL — AB (ref 4.22–5.81)
RDW: 19.6 % — AB (ref 11.5–15.5)
WBC: 8.8 10*3/uL (ref 4.0–10.5)

## 2017-02-07 LAB — I-STAT VENOUS BLOOD GAS, ED
Acid-base deficit: 3 mmol/L — ABNORMAL HIGH (ref 0.0–2.0)
BICARBONATE: 20.6 mmol/L (ref 20.0–28.0)
O2 Saturation: 89 %
PCO2 VEN: 30.1 mmHg — AB (ref 44.0–60.0)
PH VEN: 7.443 — AB (ref 7.250–7.430)
PO2 VEN: 54 mmHg — AB (ref 32.0–45.0)
TCO2: 22 mmol/L (ref 22–32)

## 2017-02-07 LAB — COMPREHENSIVE METABOLIC PANEL
ALBUMIN: 2.4 g/dL — AB (ref 3.5–5.0)
ALT: 12 U/L — AB (ref 17–63)
AST: 26 U/L (ref 15–41)
Alkaline Phosphatase: 93 U/L (ref 38–126)
Anion gap: 11 (ref 5–15)
BILIRUBIN TOTAL: 0.4 mg/dL (ref 0.3–1.2)
BUN: 51 mg/dL — AB (ref 6–20)
CO2: 21 mmol/L — ABNORMAL LOW (ref 22–32)
CREATININE: 2.55 mg/dL — AB (ref 0.61–1.24)
Calcium: 8.2 mg/dL — ABNORMAL LOW (ref 8.9–10.3)
Chloride: 98 mmol/L — ABNORMAL LOW (ref 101–111)
GFR calc Af Amer: 29 mL/min — ABNORMAL LOW (ref >60)
GFR, EST NON AFRICAN AMERICAN: 25 mL/min — AB (ref >60)
GLUCOSE: 145 mg/dL — AB (ref 65–99)
Potassium: 3.3 mmol/L — ABNORMAL LOW (ref 3.5–5.1)
Sodium: 130 mmol/L — ABNORMAL LOW (ref 135–145)
Total Protein: 6.3 g/dL — ABNORMAL LOW (ref 6.5–8.1)

## 2017-02-07 LAB — I-STAT CG4 LACTIC ACID, ED
LACTIC ACID, VENOUS: 1.03 mmol/L (ref 0.5–1.9)
Lactic Acid, Venous: 0.86 mmol/L (ref 0.5–1.9)

## 2017-02-07 LAB — CBG MONITORING, ED: Glucose-Capillary: 143 mg/dL — ABNORMAL HIGH (ref 65–99)

## 2017-02-07 NOTE — ED Triage Notes (Signed)
Pt to ED via [facility] with sister stating that pt has been increasingly sleepy and having difficulty walking. Pt has fistula in left upper arm but is not being dialyzed yet. Pt has swelling in left leg, positive pedal pulse , left middle toe discolored, dark purple.

## 2017-02-07 NOTE — ED Notes (Signed)
Ambulated Pt in [place] with a Walker. Pt did not complain of any pain to this tech. Pt seemed very tired but walked with little to no assistance from me.

## 2017-02-08 NOTE — ED Notes (Signed)
Family at bedside. 

## 2017-02-08 NOTE — Discharge Instructions (Signed)
IT IS VERY IMPORTANT FOR YOU TO CONTACT THE VASCULAR SURGERY CLINIC TO SCHEDULE AN APPOINTMENT. YOU ALSO NEED TO FOLLOW UP WITH YOUR PRIMARY CARE CLINIC FOR FURTHER WORK UP OF YOUR WEAKNESS. RETURN TO ER IF ANY FEVERS, WORSENING FOOT SYMPTOMS, CHEST PAIN, OR BREATHING PROBLEMS.

## 2017-02-08 NOTE — ED Notes (Signed)
Patient is alert and orientedx4.  Patient was explained discharge instructions and they understood them with no questions.  His sister [name] or niece Karie Kirks is taking patient home.

## 2017-02-08 NOTE — ED Notes (Signed)
DP Pulses Present by Doppler. Heard also with MD present.

## 2017-02-08 NOTE — ED Provider Notes (Signed)
[facility] EMERGENCY DEPARTMENT Provider Note   CSN: [id number] Arrival date & time: 02/07/17  1739     History   Chief Complaint Chief Complaint  Patient presents with  . Altered Mental Status  . Leg Pain    HPI Leonard [name] is a 67 y.o. male.  67 year old male with extensive past medical history including PVD, CHF, CKD, CAD, type 2 diabetes mellitus who presents with weakness and left leg problems.  Patient was picked up by EMS after sister reported that he has been increasingly sleepy and having difficulty walking.  Mom later explains that he has been generally weak and having difficulty walking with his walker at home.  He has had ongoing swelling of his left lower leg and has seen Dr. Sharol Given this week for left third toe discoloration.  He has been referred to vascular surgery clinic but they have not been contacted yet.  Patient reports that he sometimes has severe pain in his leg but denies any currently.  He denies any fevers, vomiting, urinary symptoms, breathing problems, or cough/cold symptoms.  He has had some diarrhea recently.   The history is provided by the patient and a relative.  Altered Mental Status    Leg Pain      Past Medical History:  Diagnosis Date  . Cancer ([place])    skin cancer-  . CHF (congestive heart failure) ([place])   . Chronic kidney disease    not on dialysis  . Coronary artery disease   . Gangrene of toe ([place])    right 5th/notes 01/04/2013   . High cholesterol   . Hypertension   . Iron deficiency anemia 08/03/2015  . 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 PAD (peripheral artery disease) ([place])   . Shortness of breath dyspnea    with exertion  . Type II diabetes mellitus ([place])    Type II    Patient Active Problem List   Diagnosis Date Noted  . Collapse of left talus 01/14/2017  . Diabetic polyneuropathy associated with type 2 diabetes mellitus ([place]) 01/14/2017  . Type 2 diabetes mellitus  with pressure callus ([place]) 12/29/2016  . Idiopathic chronic gout of left foot without tophus   . Left foot pain 10/29/2016  . Long term (current) use of anticoagulants [Z79.01] 09/09/2016  . Chronic deep vein thrombosis (DVT) of femoral vein of left lower extremity ([place]) 09/03/2016  . Morbid obesity due to excess calories ([place]) 03/17/2016  . Anemia due to stage 4 chronic kidney disease treated with darbepoetin ([place]) 02/28/2016  . Fatigue associated with anemia 01/21/2016  . Diabetic retinopathy ([place]) 11/13/2015  . CHF (congestive heart failure) ([place]) 08/27/2015  . Iron deficiency anemia 08/03/2015  . Claudication ([place])   . Diabetes ([place])   . Hematuria 06/27/2015  . Skin lesion of face 02/22/2015  . 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 Deficiency anemia 01/15/2015  . CKD (chronic kidney disease) stage 4, GFR 15-29 ml/min ([facility]) 01/15/2015  . Hypothyroidism 11/22/2014  . COPD GOLD III  02/16/2014  . History of tobacco use 08/23/2013  . 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 CAD (coronary artery disease) 04/06/2013  . PVD - hx of Rt SFA PTA and s/p Rt 4-5th toe amp Dec 2014 04/05/2013  . Atherosclerosis of native artery of left lower extremity with gangrene ([place]) 02/15/2013  . Essential hypertension 11/25/2012  . Mixed hyperlipidemia 07/17/2008    Past Surgical History:  Procedure Laterality Date  .  AMPUTATION Right 01/28/2013   Procedure: RAY AMPUTATION RIGHT  4th & 5th TOE;  Surgeon: Rosetta Posner, MD;  Location: [facility] OR;  Service: Vascular;  Laterality: Right;  . ANGIOPLASTY / STENTING FEMORAL Right 01/13/2013   superficial femoral artery x 2 (3 mm x 60 mm, 4 mm x 60 mm)  . AV FISTULA PLACEMENT Left 02/11/2016   Procedure: LEFT UPPER ARM BRACHIAL CEPHALIC ARTERIOVENOUS (AV) FISTULA CREATION;  Surgeon: Conrad Glenview, MD;  Location: [facility];  Service: Vascular;  Laterality: Left;  . COLONOSCOPY W/ POLYPECTOMY    . CORONARY ARTERY BYPASS GRAFT N/A 04/07/2013   Procedure:  CORONARY ARTERY BYPASS GRAFTING (CABG);  Surgeon: Ivin Poot, MD;  Location: [facility];  Service: Open Heart Surgery;  Laterality: N/A;  . INTRAOPERATIVE TRANSESOPHAGEAL ECHOCARDIOGRAM N/A 04/07/2013   Procedure: INTRAOPERATIVE TRANSESOPHAGEAL ECHOCARDIOGRAM;  Surgeon: Ivin Poot, MD;  Location: [facility];  Service: Open Heart Surgery;  Laterality: N/A;  . LEFT HEART CATHETERIZATION WITH CORONARY ANGIOGRAM N/A 04/05/2013   Procedure: LEFT HEART CATHETERIZATION WITH CORONARY ANGIOGRAM;  Surgeon: Peter M Martinique, MD;  [facility] C[facility] LAB;  Service: Cardiovascular;  Laterality: N/A;  . LOWER EXTREMITY ANGIOGRAM Bilateral 01/06/2013   Procedure: LOWER EXTREMITY ANGIOGRAM;  Surgeon: Conrad Hope, MD;  Location: [facility] CATH LAB;  Service: Cardiovascular;  Laterality: Bilateral;  . LOWER EXTREMITY ANGIOGRAM Right 01/13/2013   Procedure: LOWER EXTREMITY ANGIOGRAM;  Surgeon: Conrad Nikiski, MD;  Location: [facility] CATH LAB;  Service: Cardiovascular;  Laterality: Right;  . SKIN CANCER EXCISION  2017  . THROAT SURGERY  1983   polyps  . TONSILLECTOMY AND ADENOIDECTOMY  ~ 1965       Home Medications    Prior to Admission medications   Medication Sig Start Date End Date Taking? Authorizing Provider  allopurinol (ZYLOPRIM) 100 MG tablet TAKE ONE TABLET TWICE DAILY Patient taking differently: TAKE ONE TABLET ('100mg'$ ) TWICE DAILY 01/21/17  Yes [name], MD  amLODipine (NORVASC) 10 MG tablet Take 1 tablet (10 mg total) by mouth daily. 02/19/16  Yes Clegg, Amy D, NP  aspirin 81 MG chewable tablet Chew 81 mg by mouth daily.   Yes [provider]  atorvastatin (LIPITOR) 40 MG tablet TAKE ONE (1) TABLET BY MOUTH EVERY DAY AT 6:00PM Patient taking differently: Take 40 mg by mouth once a day at 6 PM 11/17/16  Yes Larey Dresser, MD  carvedilol (COREG) 25 MG tablet TAKE TWO (2) TABLETS BY MOUTH 2 TIMES DAILY WITH A MEAL Patient taking differently: TAKE TWO TABLETS ('50mg'$ ) BY MOUTH 2 TIMES DAILY WITH A MEAL  01/14/17  Yes Bensimhon, Shaune Pascal, MD  cloNIDine (CATAPRES) 0.2 MG tablet TAKE TWO (2) TABLETS BY MOUTH 2 TIMES DAILY Patient taking differently: Take 0.4 mg by mouth two times a day 11/11/16  Yes Bensimhon, Shaune Pascal, MD  Colchicine 0.6 MG CAPS TAKE ONE (1) CAPSULE BY MOUTH 2 TIMES DAILY Patient taking differently: TAKE ONE CAPSULE (0.'6mg'$ ) BY MOUTH 2 TIMES DAILY 01/16/17  Yes [name], MD  Darbepoetin Alfa (ARANESP) 500 MCG/ML SOSY injection Inject 500 mcg into the skin every 14 (fourteen) days.    Yes [provider]  doxazosin (CARDURA) 8 MG tablet Take 1 tablet (8 mg total) by mouth every evening. 12/01/16  Yes Hensel, Jamal Collin, MD  ferrous sulfate 325 (65 FE) MG tablet TAKE ONE (1) TABLET BY MOUTH TWO (2) TIMES DAILY WITH A MEAL Patient taking differently: Take 325 mg by mouth two times  a day with a meal 10/07/16  Yes [name], MD  ferumoxytol 510 mg in sodium chloride 0.9 % 100 mL Inject into the vein every 7 (seven) days.    Yes [provider]  hydrALAZINE (APRESOLINE) 100 MG tablet TAKE ONE TABLET BY MOUTH EVERY EIGHT HOURS Patient taking differently: Take 100 mg by mouth every 8 hours 11/14/16  Yes Gambino, Arlie Solomons, MD  insulin aspart (NOVOLOG FLEXPEN) 100 UNIT/ML FlexPen Inject 15 Units 2 (two) times daily before lunch and supper into the skin. 12/15/16  Yes Hensel, Jamal Collin, MD  Insulin Glargine (LANTUS SOLOSTAR) 100 UNIT/ML Solostar Pen Inject 50 Units into the skin every morning. Patient taking differently: Inject 50 Units into the skin daily after breakfast.  10/10/16  Yes Gambino, Arlie Solomons, MD  isosorbide mononitrate (IMDUR) 60 MG 24 hr tablet TAKE ONE (1) TABLET BY MOUTH EVERY DAY Patient taking differently: TAKE ONE TABLET (105m) BY MOUTH EVERY DAY 01/14/17  Yes Bensimhon, DShaune Pascal MD  levothyroxine (SYNTHROID, LEVOTHROID) 75 MCG tablet Take 1 tablet (75 mcg total) by mouth daily. 12/31/16  Yes GCarlyle Dolly MD  nitroGLYCERIN  (NITROSTAT) 0.4 MG SL tablet Place 1 tablet (0.4 mg total) under the tongue every 5 (five) minutes as needed for chest pain. 11/14/16  Yes GCarlyle Dolly MD  pantoprazole (PROTONIX) 40 MG tablet TAKE ONE (1) TABLET BY MOUTH EVERY DAY Patient taking differently: TAKE ONE TABLET (415m BY MOUTH EVERY DAY 01/13/17  Yes GaCarlyle DollyMD  potassium chloride SA (K-DUR,KLOR-CON) 20 MEQ tablet Take 20 mEq by mouth 2 (two) times daily.    Yes [provider]  torsemide (DEMADEX) 20 MG tablet Take 3 tablets (60 mg total) by mouth daily. May take an extra 20 mg Daily for weight gain above 212 lbs Patient taking differently: Take 20 mg by mouth 3 (three) times daily. And may take an extra 20 mg daily for weight gain above 212 lbs 09/17/16  Yes SmArbutus LeasNP  traMADol (ULTRAM) 50 MG tablet Take 1 tablet (50 mg total) by mouth every 6 (six) hours as needed for moderate pain. 02/05/17  Yes DuNewt MinionMD  warfarin (COUMADIN) 5 MG tablet Take 2.5 mg (1/2 tablet) daily except 5 mg (1 tablet) on Monday and Friday 12/23/16  Yes McLarey DresserMD  Blood Glucose Monitoring Suppl (ONBaldwinw/Device KIT 1 kit by Does not apply route daily. 10/10/16   McDiarmid, ToBlane OharaMD  enoxaparin (LOVENOX) 100 MG/ML injection Inject 0.9 mLs (90 mg total) into the skin daily. Please inject ONLY 0.9 mLs (9053motal) on 12/04/16. Do NOT use whole syringe 12/04/16 12/05/16  Diallo, AbdEarna CoderD  glucose blood (ONETOUCH VERIO) test strip Use as instructed to test three times daily. ICD-10 code: E11.22 10/15/16   McDiarmid, TodBlane OharaD  ONEMiami Va Healthcare SystemLICA LANCETS [id number] 1 each by Does not apply route 3 (three) times daily. ICD-10 code: E11.22 10/15/16   McDiarmid, TodBlane OharaD    Family History Family History  Problem Relation Age of Onset  . Cancer Mother        lung cancer  . Hypertension Mother   . Cancer Sister        patient thinks it was uterine cancer  . Hypertension Sister   . Heart  attack Sister   . Stroke Neg Hx     Social History Social History   Tobacco Use  . Smoking status: Former Smoker  Packs/day: 1.00    Years: 46.00    Pack years: 46.00    Types: Cigarettes    Start date: 02/04/1967    Last attempt to quit: 02/03/2013    Years since quitting: 4.0  . Smokeless tobacco: Never Used  . Tobacco comment: Doing well with quitting.  Substance Use Topics  . Alcohol use: No    Alcohol/week: 0.0 oz  . Drug use: No     Allergies   Patient has no known allergies.   Review of Systems Review of Systems All other systems reviewed and are negative except that which was mentioned in HPI   Physical Exam Updated Vital Signs BP 127/64   Pulse 74   Temp 99 F (37.2 C) (Oral)   Resp (!) 26   Ht '5\' 8"'$  (1.727 m)   Wt 83.9 kg (185 lb)   SpO2 100%   BMI 28.13 kg/m   Physical Exam  Constitutional: He is oriented to person, place, and time. He appears well-developed and well-nourished. No distress.  Resting comfortably, chronically ill appearing  HENT:  Head: Normocephalic and atraumatic.  Moist mucous membranes  Eyes: Conjunctivae are normal.  Neck: Neck supple.  Cardiovascular: Normal rate, regular rhythm and normal heart sounds.  No murmur heard. Pulmonary/Chest: Effort normal and breath sounds normal.  Abdominal: Soft. Bowel sounds are normal. He exhibits no distension. There is no tenderness.  Musculoskeletal: He exhibits edema.  AV fistula L arm with palpable thrill Pitting edema L lower leg  Neurological: He is alert and oriented to person, place, and time.  Fluent speech, sleepy but arousable to voice, answering questions appropriately  Skin: Skin is warm and dry.  Necrotic L 3rd toe with blistering on dorsal L 4th toe  Psychiatric: He has a normal mood and affect.  Nursing note and vitals reviewed.      ED Treatments / Results  Labs (all labs ordered are listed, but only abnormal results are displayed) Labs Reviewed  COMPREHENSIVE  METABOLIC PANEL - Abnormal; Notable for the following components:      Result Value   Sodium 130 (*)    Potassium 3.3 (*)    Chloride 98 (*)    CO2 21 (*)    Glucose, Bld 145 (*)    BUN 51 (*)    Creatinine, Ser 2.55 (*)    Calcium 8.2 (*)    Total Protein 6.3 (*)    Albumin 2.4 (*)    ALT 12 (*)    GFR calc non Af Amer 25 (*)    GFR calc Af Amer 29 (*)    All other components within normal limits  CBC WITH DIFFERENTIAL/PLATELET - Abnormal; Notable for the following components:   RBC 3.40 (*)    Hemoglobin 7.6 (*)    HCT 25.8 (*)    MCV 75.9 (*)    MCH 22.4 (*)    MCHC 29.5 (*)    RDW 19.6 (*)    All other components within normal limits  URINALYSIS, ROUTINE W REFLEX MICROSCOPIC - Abnormal; Notable for the following components:   APPearance HAZY (*)    Hgb urine dipstick SMALL (*)    Protein, ur 100 (*)    Leukocytes, UA TRACE (*)    Bacteria, UA RARE (*)    Squamous Epithelial / LPF 0-5 (*)    All other components within normal limits  CBG MONITORING, ED - Abnormal; Notable for the following components:   Glucose-Capillary 143 (*)    All other  components within normal limits  I-STAT VENOUS BLOOD GAS, ED - Abnormal; Notable for the following components:   pH, Ven 7.443 (*)    pCO2, Ven 30.1 (*)    pO2, Ven 54.0 (*)    Acid-base deficit 3.0 (*)    All other components within normal limits  URINE CULTURE  CULTURE, BLOOD (ROUTINE X 2)  CULTURE, BLOOD (ROUTINE X 2)  I-STAT CG4 LACTIC ACID, ED  I-STAT CG4 LACTIC ACID, ED    EKG  EKG Interpretation None       Radiology Ct Head Wo Contrast  Result Date: 02/07/2017 CLINICAL DATA:  Per ed notes: Pt to ED via [facility] with sister stating that pt has been increasingly sleepy and having difficulty walking. Pt has fistula in left upper arm but is not being dialyzed yet. EXAM: CT HEAD WITHOUT CONTRAST TECHNIQUE: Contiguous axial images were obtained from the base of the skull through the vertex without intravenous contrast.  COMPARISON:  None. FINDINGS: Brain: There is mild central and cortical atrophy. There is no intra or extra-axial fluid collection or mass lesion. The basilar cisterns and ventricles have a normal appearance. There is no CT evidence for acute infarction or hemorrhage. Vascular: There is extensive atherosclerotic calcification of the internal carotid arteries and vertebral arteries. Skull: Normal. Negative for fracture or focal lesion. Sinuses/Orbits: There is mucoperiosteal thickening of the paranasal sinuses. Other: Suspect remote nasal bone fractures. No acute fractures identified. IMPRESSION: 1.  No evidence for acute  abnormality. 2. Central and cortical atrophy. Electronically Signed   By: Nolon Nations M.D.   On: 02/07/2017 21:00    Procedures Procedures (including critical care time)  Medications Ordered in ED Medications - No data to display   Initial Impression / Assessment and Plan / ED Course  I have reviewed the triage vital signs and the nursing notes.  Pertinent labs & imaging results that were available during my care of the patient were reviewed by me and considered in my medical decision making (see chart for details).     On exam he was comfortable and denies any significant complaints. VS reassuring. Although there was a possible report of altered mental status, he was answering questions appropriately for me.  He has chronic changes on his left foot, I reviewed Dr. Jess Barters note from this week and the description sounds very similar.  It appears that he is being appropriately referred to vascular surgery for arterial studies.  Today I am unable to palpate a DP pulse but it is dopplerable, same as per Dr. Jess Barters note. His labs show normal WBC count, no evidence of infection, stable creatinine, K 3.3. Na 130 which is not significantly different from previous. Head CT negative acute. I had a long discussion with the patient's mother who was concerned about his generalized weakness  and functioning at home. Pt has been ambulatory with walker here, no problems. I explained that although I have not identified a specific cause of his generalized weakness, his workup has been reassuring here and I have no life-threatening conditions warranting admission today. His vascular problems are well-known by Dr. Sharol Given and he has had appropriate referral. He currently has home health for medications and some ADLs. Although mom is satisfied with not having an answer to his weakness, I explained to her that he would need further evaluation by his PCP and that his workup is not necessarily complete just because tonight's evaluation was reassuring.  The patient feels comfortable with discharge and f/u plan and  discharged in satisfactory condition.  Final Clinical Impressions(s) / ED Diagnoses   Final diagnoses:  Gangrene of toe of left foot ([place])  Peripheral vascular disease ([place])  Weakness    ED Discharge Orders    None       Daylen Hack, Wenda Overland, MD 02/08/17 0126

## 2017-02-09 ENCOUNTER — Telehealth: Payer: Self-pay | Admitting: *Deleted

## 2017-02-09 ENCOUNTER — Ambulatory Visit: Payer: Medicare Other

## 2017-02-09 NOTE — Telephone Encounter (Signed)
Rexford Prevo had called in with concerns that patient was not doing well at home (weakness and difficulty doing ADLs). Returned call and spoke directly with patient. States he's fine and has appt with PCP in 2 days. No c/o. Hubbard Hartshorn, RN, BSN

## 2017-02-10 ENCOUNTER — Ambulatory Visit (INDEPENDENT_AMBULATORY_CARE_PROVIDER_SITE_OTHER): Payer: Medicare Other | Admitting: *Deleted

## 2017-02-10 ENCOUNTER — Other Ambulatory Visit (HOSPITAL_COMMUNITY): Payer: Self-pay

## 2017-02-10 ENCOUNTER — Other Ambulatory Visit: Payer: Self-pay | Admitting: Family Medicine

## 2017-02-10 DIAGNOSIS — Z7901 Long term (current) use of anticoagulants: Secondary | ICD-10-CM

## 2017-02-10 DIAGNOSIS — I82512 Chronic embolism and thrombosis of left femoral vein: Secondary | ICD-10-CM | POA: Diagnosis not present

## 2017-02-10 LAB — URINE CULTURE

## 2017-02-10 LAB — POCT INR: INR: 1.7

## 2017-02-10 NOTE — Progress Notes (Signed)
Paramedicine Encounter    Patient ID: Leonard [name], male    DOB: 1950-09-28, 67 y.o.   MRN: [id number]    Patient Care Team: [name], MD as PCP - General (Family Medicine) [name], MD as Consulting Physician (Nephrology)  Patient Active Problem List   Diagnosis Date Noted  . Encounter for vitamin deficiency screening 02/11/2017  . Collapse of left talus 01/14/2017  . Diabetic polyneuropathy associated with type 2 diabetes mellitus ([place]) 01/14/2017  . 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 Idiopathic chronic gout of left foot without tophus   . Left foot pain 10/29/2016  . Long term (current) use of anticoagulants [Z79.01] 09/09/2016  . Chronic deep vein thrombosis (DVT) of femoral vein of left lower extremity ([place]) 09/03/2016  . Morbid obesity due to excess calories ([place]) 03/17/2016  . Anemia due to stage 4 chronic kidney disease treated with darbepoetin ([place]) 02/28/2016  . Fatigue associated with anemia 01/21/2016  . Diabetic retinopathy ([place]) 11/13/2015  . CHF (congestive heart failure) ([place]) 08/27/2015  . Iron deficiency anemia 08/03/2015  . Claudication ([place])   . Diabetes ([place])   . Hematuria 06/27/2015  . Skin lesion of face 02/22/2015  . 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 Deficiency anemia 01/15/2015  . CKD (chronic kidney disease) stage 4, GFR 15-29 ml/min (HCC) 01/15/2015  . Hypothyroidism 11/22/2014  . COPD GOLD III  02/16/2014  . History of tobacco use 08/23/2013  . 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 CAD (coronary artery disease) 04/06/2013  . PVD - hx of Rt SFA PTA and s/p Rt 4-5th toe amp Dec 2014 04/05/2013  . Atherosclerosis of native artery of left lower extremity with gangrene ([place]) 02/15/2013  . Essential hypertension 11/25/2012  . Mixed hyperlipidemia 07/17/2008    Current Outpatient Medications:  .  allopurinol (ZYLOPRIM) 100 MG tablet, TAKE ONE TABLET TWICE DAILY (Patient  taking differently: TAKE ONE TABLET (13m) TWICE DAILY), Disp: 60 tablet, Rfl: 0 .  amLODipine (NORVASC) 10 MG tablet, Take 1 tablet (10 mg total) by mouth daily., Disp: 30 tablet, Rfl: 6 .  atorvastatin (LIPITOR) 40 MG tablet, TAKE ONE (1) TABLET BY MOUTH EVERY DAY AT 6:00PM (Patient taking differently: Take 40 mg by mouth once a day at 6 PM), Disp: 90 tablet, Rfl: 3 .  Blood Glucose Monitoring Suppl (ONETOUCH VERIO FLEX SYSTEM) w/Device KIT, 1 kit by Does not apply route daily., Disp: 1 kit, Rfl: 3 .  carvedilol (COREG) 25 MG tablet, TAKE TWO (2) TABLETS BY MOUTH 2 TIMES DAILY WITH A MEAL (Patient taking differently: TAKE TWO TABLETS (554m BY MOUTH 2 TIMES DAILY WITH A MEAL), Disp: 120 tablet, Rfl: 3 .  cloNIDine (CATAPRES) 0.2 MG tablet, TAKE TWO (2) TABLETS BY MOUTH 2 TIMES DAILY (Patient taking differently: Take 0.4 mg by mouth two times a day), Disp: 120 tablet, Rfl: 3 .  Colchicine 0.6 MG CAPS, TAKE ONE (1) CAPSULE BY MOUTH 2 TIMES DAILY (Patient taking differently: TAKE ONE CAPSULE (0.63m52mBY MOUTH 2 TIMES DAILY), Disp: 30 capsule, Rfl: 0 .  doxazosin (CARDURA) 8 MG tablet, Take 1 tablet (8 mg total) by mouth every evening., Disp: , Rfl:  .  ferrous sulfate 325 (65 FE) MG tablet, TAKE ONE (1) TABLET BY MOUTH TWO (2) TIMES DAILY WITH A MEAL (Patient taking differently: Take 325 mg by mouth two times a day with a meal), Disp: 60 tablet, Rfl: 3 .  hydrALAZINE (APRESOLINE) 100 MG tablet,  TAKE ONE TABLET BY MOUTH EVERY EIGHT HOURS (Patient taking differently: Take 100 mg by mouth every 8 hours), Disp: 90 tablet, Rfl: 1 .  isosorbide mononitrate (IMDUR) 60 MG 24 hr tablet, TAKE ONE (1) TABLET BY MOUTH EVERY DAY (Patient taking differently: TAKE ONE TABLET (45m) BY MOUTH EVERY DAY), Disp: 30 tablet, Rfl: 3 .  levothyroxine (SYNTHROID, LEVOTHROID) 75 MCG tablet, Take 1 tablet (75 mcg total) by mouth daily., Disp: 90 tablet, Rfl: 1 .  pantoprazole (PROTONIX) 40 MG tablet, TAKE ONE (1) TABLET BY MOUTH  EVERY DAY (Patient taking differently: TAKE ONE TABLET (469m BY MOUTH EVERY DAY), Disp: 90 tablet, Rfl: 0 .  potassium chloride SA (K-DUR,KLOR-CON) 20 MEQ tablet, Take 20 mEq by mouth 2 (two) times daily. , Disp: , Rfl:  .  torsemide (DEMADEX) 20 MG tablet, Take 3 tablets (60 mg total) by mouth daily. May take an extra 20 mg Daily for weight gain above 212 lbs (Patient taking differently: Take 20 mg by mouth 3 (three) times daily. And may take an extra 20 mg daily for weight gain above 212 lbs), Disp: 90 tablet, Rfl: 6 .  warfarin (COUMADIN) 5 MG tablet, Take 2.5 mg (1/2 tablet) daily except 5 mg (1 tablet) on Monday and Friday, Disp: 18 tablet, Rfl: 1 .  aspirin 81 MG chewable tablet, Chew 81 mg by mouth daily., Disp: , Rfl:  .  Darbepoetin Alfa (ARANESP) 500 MCG/ML SOSY injection, Inject 500 mcg into the skin every 14 (fourteen) days. , Disp: , Rfl:  .  enoxaparin (LOVENOX) 100 MG/ML injection, Inject 0.9 mLs (90 mg total) into the skin daily. Please inject ONLY 0.9 mLs (9086motal) on 12/04/16. Do NOT use whole syringe, Disp: 0.9 mL, Rfl: 0 .  ferumoxytol 510 mg in sodium chloride 0.9 % 100 mL, Inject into the vein every 7 (seven) days. , Disp: , Rfl:  .  glucose blood (ONETOUCH VERIO) test strip, Use as instructed to test three times daily. ICD-10 code: E11.22, Disp: 100 each, Rfl: 12 .  insulin aspart (NOVOLOG FLEXPEN) 100 UNIT/ML FlexPen, Inject 15 Units 2 (two) times daily before lunch and supper into the skin., Disp: 15 mL, Rfl: 5 .  Insulin Glargine (LANTUS SOLOSTAR) 100 UNIT/ML Solostar Pen, Inject 50 Units into the skin every morning. (Patient taking differently: Inject 50 Units into the skin daily after breakfast. ), Disp: 15 mL, Rfl: 3 .  nitroGLYCERIN (NITROSTAT) 0.4 MG SL tablet, Place 1 tablet (0.4 mg total) under the tongue every 5 (five) minutes as needed for chest pain., Disp: 30 tablet, Rfl: 12 .  ONETOUCH DELICA LANCETS 33G25OSC, 1 each by Does not apply route 3 (three) times daily.  ICD-10 code: E11.22, Disp: 100 each, Rfl: 12 .  traMADol (ULTRAM) 50 MG tablet, Take 1 tablet (50 mg total) by mouth every 6 (six) hours as needed for moderate pain., Disp: 60 tablet, Rfl: 0 No current facility-administered medications for this visit.   Facility-Administered Medications Ordered in Other Visits:  .  sodium chloride flush (NS) 0.9 % injection 10 mL, 10 mL, Intracatheter, PRN, GorAlvy Bimleri, MD No Known Allergies   Social History   Socioeconomic History  . Marital status: Single    Spouse name: Not on file  . Number of children: Not on file  . Years of education: Not on file  . Highest education level: Not on file  Social Needs  . Financial resource strain: Not on file  . Food insecurity - worry: Not on  file  . Food insecurity - inability: Not on file  . Transportation needs - medical: Not on file  . Transportation needs - non-medical: Not on file  Occupational History  . Not on file  Tobacco Use  . Smoking status: Former Smoker    Packs/day: 1.00    Years: 46.00    Pack years: 46.00    Types: Cigarettes    Start date: 02/04/1967    Last attempt to quit: 02/03/2013    Years since quitting: 4.0  . Smokeless tobacco: Never Used  . Tobacco comment: Doing well with quitting.  Substance and Sexual Activity  . Alcohol use: No    Alcohol/week: 0.0 oz  . Drug use: No  . Sexual activity: Not Currently  Other Topics Concern  . Not on file  Social History Narrative   Lives with sister, [name] in [place].  Retired - former Sports coach.    Physical Exam  Pulmonary/Chest: No respiratory distress. He has no wheezes. He has no rales.  Abdominal: He exhibits no distension. There is no tenderness. There is no rebound.  Musculoskeletal: He exhibits edema.  LEFT FOOT HX OF SAME  Skin: Skin is warm and dry. He is not diaphoretic.        Future Appointments  Date Time Provider [place]  02/13/2017 11:00 AM [facility] ONLY [facility] None  02/13/2017  11:30 AM [facility] E14 [facility] None  02/13/2017  3:00 PM [facility] I27 DNS [facility] None  02/14/2017  4:00 PM [facility]-315 MR 2 GI-315MRI [facility]  02/17/2017  9:10 AM [name], MD [facility] [place]  02/24/2017 10:15 AM [facility]  02/26/2017  9:30 AM Newt Minion, MD PO-[facility] None  02/27/2017 11:30 AM [facility] ONLY [facility] None  02/27/2017 12:00 PM [facility] I27 DNS [facility] None  02/27/2017  3:30 PM [facility] J32 DNS [facility] None  03/04/2017  3:30 PM [facility] HS VASC 1 [facility]-H[facility]I VVS  03/06/2017  9:30 AM Conrad [place], MD [facility]  03/13/2017 11:30 AM [facility] ONLY [facility] None  03/13/2017 12:00 PM [facility] B6 [facility] None  03/13/2017  3:30 PM [facility] INJ NURSE [facility] None  03/26/2017 12:00 PM [facility] LAB 6 [facility] None  03/26/2017 12:30 PM Gorsuch, Ni, MD [facility] None  03/26/2017  1:30 PM [facility] A3 [facility] None  03/27/2017  4:00 PM [facility] INJ NURSE [facility] None  04/10/2017 11:30 AM [facility] ONLY [facility] None  04/10/2017 12:00 PM [facility] A1 [facility] None  04/10/2017  3:30 PM [facility] INJ NURSE [facility] None  04/24/2017 11:30 AM [facility] LAB 5 [facility] None  04/24/2017 12:00 PM [facility] A3 [facility] None  04/24/2017  4:00 PM [facility] J32 DNS [facility] None  05/06/2017  2:45 PM Gardiner Barefoot, DPM [facility]    ATF pt CAO x4 sitting in the recliner eating breakfast.  Pt hasnt taken her medications this am yet because he was trying to wait til he ate. Pt's tele scale isn't working and im unable to find the company that the scale came from.  Pt missed about 3 medication doses this week.  Pt denies sob, chest pain and dizziness.  Pt went to the ED over the weekend for gangreen on his toe.  rx bottles verified and pill box refilled.    BP (!) 150/70   Pulse 72   Resp 16   SpO2 99%    **rx called in: Potassium Colchicine  cbg [place], EMT Paramedic 02/11/2017  ACTION: Home visit completed Next visit planned for next week

## 2017-02-10 NOTE — Telephone Encounter (Signed)
I have an appt with him tomorrow. Will assess further at that time. Thank you.

## 2017-02-10 NOTE — Patient Instructions (Signed)
Description   Today take 1 tablet, then Continue taking 2.5 mg (1/2 tablet) daily except 5 mg(1 tablet) on Fridays. Recheck in 2 weeks. Call if placed on any new medications or if scheduled for any procedures or surgery [phone].

## 2017-02-11 ENCOUNTER — Other Ambulatory Visit: Payer: Self-pay

## 2017-02-11 ENCOUNTER — Telehealth: Payer: Self-pay | Admitting: Emergency Medicine

## 2017-02-11 ENCOUNTER — Encounter: Payer: Self-pay | Admitting: Family Medicine

## 2017-02-11 ENCOUNTER — Ambulatory Visit (INDEPENDENT_AMBULATORY_CARE_PROVIDER_SITE_OTHER): Payer: Medicare Other | Admitting: Family Medicine

## 2017-02-11 VITALS — BP 118/64 | HR 68 | Temp 97.9°F | Ht 68.0 in | Wt 188.6 lb

## 2017-02-11 DIAGNOSIS — E559 Vitamin D deficiency, unspecified: Secondary | ICD-10-CM

## 2017-02-11 DIAGNOSIS — Z1321 Encounter for screening for nutritional disorder: Secondary | ICD-10-CM

## 2017-02-11 DIAGNOSIS — I70262 Atherosclerosis of native arteries of extremities with gangrene, left leg: Secondary | ICD-10-CM | POA: Diagnosis not present

## 2017-02-11 DIAGNOSIS — R5383 Other fatigue: Secondary | ICD-10-CM | POA: Diagnosis not present

## 2017-02-11 DIAGNOSIS — I70269 Atherosclerosis of native arteries of extremities with gangrene, unspecified extremity: Secondary | ICD-10-CM

## 2017-02-11 DIAGNOSIS — D649 Anemia, unspecified: Secondary | ICD-10-CM

## 2017-02-11 DIAGNOSIS — Z1211 Encounter for screening for malignant neoplasm of colon: Secondary | ICD-10-CM | POA: Diagnosis not present

## 2017-02-11 DIAGNOSIS — Z7189 Other specified counseling: Secondary | ICD-10-CM | POA: Insufficient documentation

## 2017-02-11 NOTE — Progress Notes (Signed)
Subjective:    Patient ID: Leonard [name] , male   DOB: November 27, 1950 , 67 y.o..   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 Medication Problem  . foot is black    1. ED follow up: Patient has been feeling much better. Monday patient perked back up per sister's report. He has not fallen since coming home from the ED. He notes that he has just had weakness of his left foot and this caused his previous fall. Sister notes he has had diarrhea for about 3-4 weeks. Patient denies this.  Sister thinks that patient's left foot is getting more black.  Patient does not have any concerns today however her sister is very concerned to the point where it makes her emotional.  Review of Systems: Per HPI.   Past Medical History: Patient Active Problem List   Diagnosis Date Noted  . Atherosclerotic peripheral vascular disease with gangrene ([place]) 02/14/2017  . Encounter for vitamin deficiency screening 02/11/2017  . Collapse of left talus 01/14/2017  . Diabetic polyneuropathy associated with type 2 diabetes mellitus ([place]) 01/14/2017  . 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 Idiopathic chronic gout of left foot without tophus   . Left foot pain 10/29/2016  . Long term (current) use of anticoagulants [Z79.01] 09/09/2016  . Chronic deep vein thrombosis (DVT) of femoral vein of left lower extremity ([place]) 09/03/2016  . Morbid obesity due to excess calories ([place]) 03/17/2016  . Anemia due to stage 4 chronic kidney disease treated with darbepoetin ([place]) 02/28/2016  . Fatigue associated with anemia 01/21/2016  . Diabetic retinopathy ([place]) 11/13/2015  . CHF (congestive heart failure) ([place]) 08/27/2015  . Iron deficiency anemia 08/03/2015  . Claudication  ([place])   . Diabetes ([place])   . Hematuria 06/27/2015  . Skin lesion of face 02/22/2015  . 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 Deficiency anemia 01/15/2015  . CKD (chronic kidney disease) stage 4, GFR 15-29 ml/min (HCC) 01/15/2015  . Hypothyroidism 11/22/2014  . COPD GOLD III  02/16/2014  . History of tobacco use 08/23/2013  . 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 CAD (coronary artery disease) 04/06/2013  . PVD - hx of Rt SFA PTA and s/p Rt 4-5th toe amp Dec 2014 04/05/2013  . Essential hypertension 11/25/2012  . Mixed hyperlipidemia 07/17/2008    Medications: reviewed   Social Hx:  reports that he quit smoking about 4 years ago. His smoking use included cigarettes. He started smoking about 50 years ago. He has a 46.00 pack-year smoking history. he has never used smokeless tobacco.   Objective:   BP 118/64   Pulse 68   Temp 97.9 F (36.6 C) (Oral)   Ht 5\' 8"  (1.727 m)   Wt 188 lb 9.6 oz (85.5 kg)   SpO2 99%   BMI 28.68 kg/m  Physical Exam  Gen: NAD, alert, cooperative with exam, sleepy Cardiac: Regular rate and rhythm, edematous right foot with nonpalpable pulse Respiratory: Clear to auscultation bilaterally,  non-labored breathing Skin: Left foot: appears swollen and edematous.  Necrotic appearing third and fourth toes with purulent drainage coming from the third toe.  Black appearance extends slightly to the midfoot   Assessment & Plan:  Atherosclerotic peripheral vascular disease with gangrene ([place]) Patient's left foot appears more black with new discharge as compared to ED photo 1 week ago.  Denies any pain of this foot at present.  DP pulse nonpalpable.  Has been following closely with Dr. Sharol Given who has consulted vascular for assessment of lower extremity perfusion.  His appointment is at the end of this month with the vascular specialist.  He is reassuringly afebrile and vital signs are normal.  Concerned that patient may have osteomyelitis of this left  foot particularly at the third and fourth digits.  Patient is not concerned with his foot changes but his sister is very upset seeing as how his foot continues to deteriorate.  Patient discussed and seen with Dr. Mingo Amber.  The plan is as follows -MRI of left foot -If MRI showing osteomyelitis, will discuss with Dr. Sharol Given for further management -Follow-up with Dr. Sharol Given on January 24 and was vascular on January 30 -ED precautions discussed -Tramadol as needed for pain although not endorsing today -Plan discussed with sister [name] and patient  Fatigue associated with anemia Patient continues to be sleepy on exam.  He is arousable but when not actively talking we will close his eyes.  Referred him to sleep medicine December 3 to assess for sleep apnea, Sister [name] says nobody called to schedule this.  He does have a history of sleep apnea but does not wear his CPAP machine.  Additionally has persistent anemia and has not received a colonoscopy -Referral placed again to sleep medicine -Referral to GI for colonoscopy - Labs as below -Follow-up in 1 month  Orders Placed This Encounter  Procedures  . MR FOOT LEFT WO CONTRAST    Ht: 5'8 / wt: 188 / not claus / heart sx, triple bypass / pt uses walker / mcr, mcd Epic order/ miriam w pt    Standing Status:   Future    Number of Occurrences:   1    Standing Expiration Date:   04/12/2018    Order Specific Question:   What is the patient's sedation requirement?    Answer:   No Sedation    Order Specific Question:   Does the patient have a pacemaker or implanted devices?    Answer:   No    Order Specific Question:   Preferred imaging location?    Answer:   [facility] (table limit-550lbs)    Order Specific Question:   Radiology Contrast Protocol - do NOT remove file path    Answer:   [email or web address]  . TSH  . CBC  . Basic Metabolic Panel  . Vitamin D, 25-hydroxy  . Ambulatory referral to Sleep Studies     Referral Priority:   Routine    Referral Type:   Consultation    Referral Reason:   Specialty Services Required    Number of Visits Requested:   1    Smitty Cords, MD [place], PGY-3

## 2017-02-11 NOTE — Telephone Encounter (Signed)
Post ED Visit - Positive Culture Follow-up  Culture report reviewed by antimicrobial stewardship pharmacist:  []  Elenor Quinones, Pharm.D. []  Heide Guile, Pharm.D., BCPS AQ-ID []  Parks Neptune, Pharm.D., BCPS []  Alycia Rossetti, Pharm.D., BCPS []  [place], Pharm.D., BCPS, AAHIVP []  Legrand Como, Pharm.D., BCPS, AAHIVP []  Salome Arnt, PharmD, BCPS []  Jalene Mullet, PharmD []  Vincenza Hews, PharmD, BCPS  Positive urine culture Treated with none, asymptomatic, organism sensitive to the same and no further patient follow-up is required at this time.  Hazle Nordmann 02/11/2017, 2:21 PM

## 2017-02-11 NOTE — Progress Notes (Signed)
ED Antimicrobial Stewardship Positive Culture Follow Up   Leonard [name] is an 67 y.o. male who presented to [facility] on 02/07/2017 with a chief complaint of  Chief Complaint  Patient presents with  . Altered Mental Status  . Leg Pain    Recent Results (from the past 720 hour(s))  Culture, blood (routine x 2)     Status: None (Preliminary result)   Collection Time: 02/07/17  6:55 PM  Result Value Ref Range Status   Specimen Description BLOOD RIGHT FOREARM  Final   Special Requests   Final    BOTTLES DRAWN AEROBIC AND ANAEROBIC Blood Culture adequate volume   Culture NO GROWTH 3 DAYS  Final   Report Status PENDING  Incomplete  Culture, blood (routine x 2)     Status: None (Preliminary result)   Collection Time: 02/07/17  8:20 PM  Result Value Ref Range Status   Specimen Description BLOOD RIGHT HAND  Final   Special Requests   Final    BOTTLES DRAWN AEROBIC AND ANAEROBIC Blood Culture adequate volume   Culture NO GROWTH 3 DAYS  Final   Report Status PENDING  Incomplete  Urine culture     Status: Abnormal   Collection Time: 02/07/17  9:33 PM  Result Value Ref Range Status   Specimen Description URINE, CLEAN CATCH  Final   Special Requests NONE  Final   Culture >=100,000 COLONIES/mL ESCHERICHIA COLI (A)  Final   Report Status 02/10/2017 FINAL  Final   Organism ID, Bacteria ESCHERICHIA COLI (A)  Final      Susceptibility   Escherichia coli - MIC*    AMPICILLIN >=32 RESISTANT Resistant     CEFAZOLIN >=64 RESISTANT Resistant     CEFTRIAXONE 8 SENSITIVE Sensitive     CIPROFLOXACIN <=0.25 SENSITIVE Sensitive     GENTAMICIN <=1 SENSITIVE Sensitive     IMIPENEM <=0.25 SENSITIVE Sensitive     NITROFURANTOIN 64 INTERMEDIATE Intermediate     TRIMETH/SULFA <=20 SENSITIVE Sensitive     AMPICILLIN/SULBACTAM 16 INTERMEDIATE Intermediate     PIP/TAZO <=4 SENSITIVE Sensitive     Extended ESBL NEGATIVE Sensitive     * >=100,000 COLONIES/mL ESCHERICHIA COLI    New antibiotic  prescription: asymptomatic bacteriuria --no antibiotics needed at this time   ED Provider: Alferd Apa, PA-C   Jalene Mullet, Pharm.D. PGY1 Pharmacy Resident 02/11/2017 9:33 AM Main Pharmacy: [phone] Phone# [phone]

## 2017-02-11 NOTE — Patient Instructions (Signed)
Thank you for coming in today, it was so nice to see you! Today we talked about:    Left foot blackness/pain: we are getting an MRI and blood work today  Please follow up in 1 week.   Bring in all your medications or supplements to each appointment for review.   If we ordered any tests today, you will be notified via telephone of any abnormalities. If everything is normal you will get a letter in the mail.   If you have any questions or concerns, please do not hesitate to call the office at [phone]. You can also message me directly via MyChart.   Sincerely,  Smitty Cords, MD

## 2017-02-12 LAB — CULTURE, BLOOD (ROUTINE X 2)
Culture: NO GROWTH
Culture: NO GROWTH
Special Requests: ADEQUATE
Special Requests: ADEQUATE

## 2017-02-12 LAB — CBC
HEMATOCRIT: 26.4 % — AB (ref 37.5–51.0)
Hemoglobin: 8 g/dL — ABNORMAL LOW (ref 13.0–17.7)
MCH: 22.2 pg — ABNORMAL LOW (ref 26.6–33.0)
MCHC: 30.3 g/dL — ABNORMAL LOW (ref 31.5–35.7)
MCV: 73 fL — AB (ref 79–97)
Platelets: 368 10*3/uL (ref 150–379)
RBC: 3.6 x10E6/uL — ABNORMAL LOW (ref 4.14–5.80)
RDW: 19.2 % — AB (ref 12.3–15.4)
WBC: 6.1 10*3/uL (ref 3.4–10.8)

## 2017-02-12 LAB — VITAMIN D 25 HYDROXY (VIT D DEFICIENCY, FRACTURES): Vit D, 25-Hydroxy: 20.4 ng/mL — ABNORMAL LOW (ref 30.0–100.0)

## 2017-02-12 LAB — BASIC METABOLIC PANEL
BUN/Creatinine Ratio: 22 (ref 10–24)
BUN: 54 mg/dL — ABNORMAL HIGH (ref 8–27)
CALCIUM: 8.6 mg/dL (ref 8.6–10.2)
CO2: 24 mmol/L (ref 20–29)
Chloride: 99 mmol/L (ref 96–106)
Creatinine, Ser: 2.47 mg/dL — ABNORMAL HIGH (ref 0.76–1.27)
GFR, EST AFRICAN AMERICAN: 30 mL/min/{1.73_m2} — AB (ref >59)
GFR, EST NON AFRICAN AMERICAN: 26 mL/min/{1.73_m2} — AB (ref >59)
Glucose: 206 mg/dL — ABNORMAL HIGH (ref 65–99)
POTASSIUM: 4.6 mmol/L (ref 3.5–5.2)
Sodium: 139 mmol/L (ref 134–144)

## 2017-02-12 LAB — TSH: TSH: 7.47 u[IU]/mL — ABNORMAL HIGH (ref 0.450–4.500)

## 2017-02-13 ENCOUNTER — Inpatient Hospital Stay: Payer: Medicare Other

## 2017-02-13 ENCOUNTER — Inpatient Hospital Stay: Payer: Medicare Other | Attending: Hematology and Oncology

## 2017-02-13 ENCOUNTER — Telehealth: Payer: Self-pay | Admitting: Licensed Clinical Social Worker

## 2017-02-13 VITALS — BP 158/62 | HR 79 | Temp 97.7°F | Resp 15

## 2017-02-13 DIAGNOSIS — D631 Anemia in chronic kidney disease: Secondary | ICD-10-CM | POA: Insufficient documentation

## 2017-02-13 DIAGNOSIS — N184 Chronic kidney disease, stage 4 (severe): Secondary | ICD-10-CM | POA: Insufficient documentation

## 2017-02-13 DIAGNOSIS — D509 Iron deficiency anemia, unspecified: Secondary | ICD-10-CM | POA: Diagnosis not present

## 2017-02-13 DIAGNOSIS — Z79899 Other long term (current) drug therapy: Secondary | ICD-10-CM | POA: Diagnosis not present

## 2017-02-13 LAB — CBC WITH DIFFERENTIAL/PLATELET
BASOS PCT: 1 %
Basophils Absolute: 0 10*3/uL (ref 0.0–0.1)
Eosinophils Absolute: 0.2 10*3/uL (ref 0.0–0.5)
Eosinophils Relative: 2 %
HEMATOCRIT: 27.8 % — AB (ref 38.4–49.9)
Hemoglobin: 8.8 g/dL — ABNORMAL LOW (ref 13.0–17.1)
LYMPHS ABS: 0.7 10*3/uL — AB (ref 0.9–3.3)
Lymphocytes Relative: 9 %
MCH: 23.1 pg — AB (ref 27.2–33.4)
MCHC: 31.5 g/dL — AB (ref 32.0–36.0)
MCV: 73.3 fL — ABNORMAL LOW (ref 79.3–98.0)
MONO ABS: 0.5 10*3/uL (ref 0.1–0.9)
MONOS PCT: 7 %
NEUTROS ABS: 6.6 10*3/uL — AB (ref 1.5–6.5)
Neutrophils Relative %: 81 %
Platelets: 360 10*3/uL (ref 140–400)
RBC: 3.8 MIL/uL — ABNORMAL LOW (ref 4.20–5.82)
RDW: 21.5 % — AB (ref 11.0–15.6)
WBC: 8.1 10*3/uL (ref 4.0–10.3)

## 2017-02-13 LAB — SAMPLE TO BLOOD BANK

## 2017-02-13 MED ORDER — DARBEPOETIN ALFA 500 MCG/ML IJ SOSY
PREFILLED_SYRINGE | INTRAMUSCULAR | Status: AC
  Filled 2017-02-13: qty 1

## 2017-02-13 MED ORDER — DARBEPOETIN ALFA 500 MCG/ML IJ SOSY
500.0000 ug | PREFILLED_SYRINGE | Freq: Once | INTRAMUSCULAR | Status: AC
  Administered 2017-02-13: 500 ug via SUBCUTANEOUS

## 2017-02-13 MED ORDER — DENOSUMAB 120 MG/1.7ML ~~LOC~~ SOLN
SUBCUTANEOUS | Status: AC
  Filled 2017-02-13: qty 1.7

## 2017-02-13 NOTE — Patient Instructions (Signed)

## 2017-02-13 NOTE — Progress Notes (Signed)
Type of Service: Clinical Social Work  Social work consult from Dr. Beecher Mcardle reference patient's sister requesting that patient needs assistance with his ADL's. LCSW called to assess needs.  Patient's sister reports patient currently has [place] 3 hours each day to help with his ADL's .  States his other sister transported him to the doctor appointment.  She would let her know that patient already has help.  Updated provided to PCP.  Casimer Lanius, LCSW Licensed Clinical Social Worker [place]   [phone] 1:53 PM

## 2017-02-13 NOTE — Progress Notes (Signed)
NO blood transfusion today per MD Alvy Bimler and ONLY Aranesp to be given today NO Iron

## 2017-02-14 ENCOUNTER — Ambulatory Visit
Admission: RE | Admit: 2017-02-14 | Discharge: 2017-02-14 | Disposition: A | Discharge status: Home / Self Care | Payer: Medicare Other | Source: Ambulatory Visit | Attending: Family Medicine | Admitting: Family Medicine

## 2017-02-14 DIAGNOSIS — I70269 Atherosclerosis of native arteries of extremities with gangrene, unspecified extremity: Secondary | ICD-10-CM

## 2017-02-14 DIAGNOSIS — I70262 Atherosclerosis of native arteries of extremities with gangrene, left leg: Secondary | ICD-10-CM

## 2017-02-14 HISTORY — DX: Atherosclerosis of native arteries of extremities with gangrene, unspecified extremity: I70.269

## 2017-02-14 NOTE — Assessment & Plan Note (Signed)
Patient continues to be sleepy on exam.  He is arousable but when not actively talking we will close his eyes.  Referred him to sleep medicine December 3 to assess for sleep apnea, Sister [name] says nobody called to schedule this.  He does have a history of sleep apnea but does not wear his CPAP machine.  Additionally has persistent anemia and has not received a colonoscopy -Referral placed again to sleep medicine -Referral to GI for colonoscopy - Labs as below -Follow-up in 1 month

## 2017-02-14 NOTE — Assessment & Plan Note (Addendum)
Patient's left foot appears more black with new discharge as compared to ED photo 1 week ago.  Denies any pain of this foot at present.  DP pulse nonpalpable.  Has been following closely with Dr. Sharol Given who has consulted vascular for assessment of lower extremity perfusion.  His appointment is at the end of this month with the vascular specialist.  He is reassuringly afebrile and vital signs are normal.  Concerned that patient may have osteomyelitis of this left foot particularly at the third and fourth digits.  Patient is not concerned with his foot changes but his sister is very upset seeing as how his foot continues to deteriorate.  Patient discussed and seen with Dr. Mingo Amber.  The plan is as follows -MRI of left foot -If MRI showing osteomyelitis, will discuss with Dr. Sharol Given for further management -Follow-up with Dr. Sharol Given on January 24 and was vascular on January 30 -ED precautions discussed -Tramadol as needed for pain although not endorsing today -Plan discussed with sister [name] and patient

## 2017-02-16 ENCOUNTER — Telehealth: Payer: Self-pay | Admitting: Family Medicine

## 2017-02-16 DIAGNOSIS — I96 Gangrene, not elsewhere classified: Secondary | ICD-10-CM

## 2017-02-16 NOTE — Telephone Encounter (Addendum)
**  After Hours/ Emergency Line Call*  Received a call to report that Leonard [name] was unable to get his foot MRI at [name] imaging due to pain. [place] imaging stated his ankle/foot was too swollen and he needed to go to [facility] hospital for the MRI instead. Unclear why this is? Will go ahead and place another order for an MRI to be scheduled at [facility] instead. Stated that [name] can take 1 of his Tramadol before the MRI.     [facility] team can we call Mr. Leonard [name] [phone] and schedule an MRI for his left foot at [facility]? This MRI is ordered as STAT. Thank  You.   [name], MD PGY-3, [facility]

## 2017-02-16 NOTE — Telephone Encounter (Signed)
MRI scheduled for 02/17/17 @ 5:00 pm at [facility]. I spoke with pt sister [name]. She will get him to the appt.  Ottis Stain, CMA

## 2017-02-17 ENCOUNTER — Ambulatory Visit: Payer: Medicare Other | Admitting: Family Medicine

## 2017-02-17 ENCOUNTER — Encounter (HOSPITAL_COMMUNITY): Payer: Self-pay | Admitting: Radiology

## 2017-02-17 ENCOUNTER — Other Ambulatory Visit (HOSPITAL_COMMUNITY): Payer: Self-pay

## 2017-02-17 ENCOUNTER — Ambulatory Visit (HOSPITAL_COMMUNITY)
Admission: RE | Admit: 2017-02-17 | Discharge: 2017-02-17 | Disposition: A | Discharge status: Home / Self Care | Payer: Medicare Other | Source: Ambulatory Visit | Attending: Family Medicine | Admitting: Family Medicine

## 2017-02-17 DIAGNOSIS — M19072 Primary osteoarthritis, left ankle and foot: Secondary | ICD-10-CM | POA: Diagnosis not present

## 2017-02-17 DIAGNOSIS — M79662 Pain in left lower leg: Secondary | ICD-10-CM | POA: Diagnosis not present

## 2017-02-17 DIAGNOSIS — R9389 Abnormal findings on diagnostic imaging of other specified body structures: Secondary | ICD-10-CM | POA: Diagnosis not present

## 2017-02-17 DIAGNOSIS — M7989 Other specified soft tissue disorders: Secondary | ICD-10-CM | POA: Diagnosis not present

## 2017-02-17 DIAGNOSIS — I96 Gangrene, not elsewhere classified: Secondary | ICD-10-CM

## 2017-02-17 NOTE — Progress Notes (Signed)
Paramedicine Encounter    Patient ID: Leonard [name], male    DOB: 1951-01-01, 67 y.o.   MRN: [id number]    Patient Care Team: [name], MD as PCP - General (Family Medicine) [name], MD as Consulting Physician (Nephrology)  Patient Active Problem List   Diagnosis Date Noted  . Atherosclerotic peripheral vascular disease with gangrene ([place]) 02/14/2017  . Encounter for vitamin deficiency screening 02/11/2017  . Collapse of left talus 01/14/2017  . Diabetic polyneuropathy associated with type 2 diabetes mellitus ([place]) 01/14/2017  . 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 Idiopathic chronic gout of left foot without tophus   . Left foot pain 10/29/2016  . Long term (current) use of anticoagulants [Z79.01] 09/09/2016  . Chronic deep vein thrombosis (DVT) of femoral vein of left lower extremity ([place]) 09/03/2016  . Morbid obesity due to excess calories ([place]) 03/17/2016  . Anemia due to stage 4 chronic kidney disease treated with darbepoetin ([place]) 02/28/2016  . Fatigue associated with anemia 01/21/2016  . Diabetic retinopathy ([place]) 11/13/2015  . CHF (congestive heart failure) ([place]) 08/27/2015  . Iron deficiency anemia 08/03/2015  . Claudication ([place])   . Diabetes ([place])   . Hematuria 06/27/2015  . Skin lesion of face 02/22/2015  . 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 Deficiency anemia 01/15/2015  . CKD (chronic kidney disease) stage 4, GFR 15-29 ml/min (HCC) 01/15/2015  . Hypothyroidism 11/22/2014  . COPD GOLD III  02/16/2014  . History of tobacco use 08/23/2013  . 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 CAD (coronary artery disease) 04/06/2013  . PVD - hx of Rt SFA PTA and s/p Rt 4-5th toe amp Dec 2014 04/05/2013  . Essential hypertension 11/25/2012  . Mixed hyperlipidemia 07/17/2008    Current Outpatient Medications:  .  allopurinol (ZYLOPRIM) 100 MG tablet, TAKE ONE TABLET TWICE DAILY (Patient taking  differently: TAKE ONE TABLET (156m) TWICE DAILY), Disp: 60 tablet, Rfl: 0 .  amLODipine (NORVASC) 10 MG tablet, Take 1 tablet (10 mg total) by mouth daily., Disp: 30 tablet, Rfl: 6 .  atorvastatin (LIPITOR) 40 MG tablet, TAKE ONE (1) TABLET BY MOUTH EVERY DAY AT 6:00PM (Patient taking differently: Take 40 mg by mouth once a day at 6 PM), Disp: 90 tablet, Rfl: 3 .  carvedilol (COREG) 25 MG tablet, TAKE TWO (2) TABLETS BY MOUTH 2 TIMES DAILY WITH A MEAL (Patient taking differently: TAKE TWO TABLETS (550m BY MOUTH 2 TIMES DAILY WITH A MEAL), Disp: 120 tablet, Rfl: 3 .  cloNIDine (CATAPRES) 0.2 MG tablet, TAKE TWO (2) TABLETS BY MOUTH 2 TIMES DAILY (Patient taking differently: Take 0.4 mg by mouth two times a day), Disp: 120 tablet, Rfl: 3 .  doxazosin (CARDURA) 8 MG tablet, Take 1 tablet (8 mg total) by mouth every evening., Disp: , Rfl:  .  ferrous sulfate 325 (65 FE) MG tablet, TAKE ONE (1) TABLET BY MOUTH TWO (2) TIMES DAILY WITH A MEAL (Patient taking differently: Take 325 mg by mouth two times a day with a meal), Disp: 60 tablet, Rfl: 3 .  glucose blood (ONETOUCH VERIO) test strip, Use as instructed to test three times daily. ICD-10 code: E11.22, Disp: 100 each, Rfl: 12 .  hydrALAZINE (APRESOLINE) 100 MG tablet, TAKE ONE TABLET BY MOUTH EVERY EIGHT HOURS (Patient taking differently: Take 100 mg by mouth every 8 hours), Disp: 90 tablet, Rfl: 1 .  insulin aspart (NOVOLOG FLEXPEN) 100 UNIT/ML FlexPen, Inject 15 Units 2 (  two) times daily before lunch and supper into the skin., Disp: 15 mL, Rfl: 5 .  Insulin Glargine (LANTUS SOLOSTAR) 100 UNIT/ML Solostar Pen, Inject 50 Units into the skin every morning. (Patient taking differently: Inject 50 Units into the skin daily after breakfast. ), Disp: 15 mL, Rfl: 3 .  isosorbide mononitrate (IMDUR) 60 MG 24 hr tablet, TAKE ONE (1) TABLET BY MOUTH EVERY DAY (Patient taking differently: TAKE ONE TABLET (60m) BY MOUTH EVERY DAY), Disp: 30 tablet, Rfl: 3 .   levothyroxine (SYNTHROID, LEVOTHROID) 75 MCG tablet, Take 1 tablet (75 mcg total) by mouth daily., Disp: 90 tablet, Rfl: 1 .  pantoprazole (PROTONIX) 40 MG tablet, TAKE ONE (1) TABLET BY MOUTH EVERY DAY (Patient taking differently: TAKE ONE TABLET (48m BY MOUTH EVERY DAY), Disp: 90 tablet, Rfl: 0 .  potassium chloride SA (K-DUR,KLOR-CON) 20 MEQ tablet, Take 20 mEq by mouth 2 (two) times daily. , Disp: , Rfl:  .  torsemide (DEMADEX) 20 MG tablet, Take 3 tablets (60 mg total) by mouth daily. May take an extra 20 mg Daily for weight gain above 212 lbs (Patient taking differently: Take 20 mg by mouth 3 (three) times daily. And may take an extra 20 mg daily for weight gain above 212 lbs), Disp: 90 tablet, Rfl: 6 .  traMADol (ULTRAM) 50 MG tablet, Take 1 tablet (50 mg total) by mouth every 6 (six) hours as needed for moderate pain., Disp: 60 tablet, Rfl: 0 .  warfarin (COUMADIN) 5 MG tablet, Take 2.5 mg (1/2 tablet) daily except 5 mg (1 tablet) on Monday and Friday, Disp: 18 tablet, Rfl: 1 .  aspirin 81 MG chewable tablet, Chew 81 mg by mouth daily., Disp: , Rfl:  .  Blood Glucose Monitoring Suppl (ONETOUCH VERIO FLEX SYSTEM) w/Device KIT, 1 kit by Does not apply route daily., Disp: 1 kit, Rfl: 3 .  Colchicine 0.6 MG CAPS, TAKE ONE (1) CAPSULE BY MOUTH 2 TIMES DAILY (Patient taking differently: TAKE ONE CAPSULE (0.71m52mBY MOUTH 2 TIMES DAILY), Disp: 30 capsule, Rfl: 0 .  Darbepoetin Alfa (ARANESP) 500 MCG/ML SOSY injection, Inject 500 mcg into the skin every 14 (fourteen) days. , Disp: , Rfl:  .  enoxaparin (LOVENOX) 100 MG/ML injection, Inject 0.9 mLs (90 mg total) into the skin daily. Please inject ONLY 0.9 mLs (44m62mtal) on 12/04/16. Do NOT use whole syringe, Disp: 0.9 mL, Rfl: 0 .  ferumoxytol 510 mg in sodium chloride 0.9 % 100 mL, Inject into the vein every 7 (seven) days. , Disp: , Rfl:  .  nitroGLYCERIN (NITROSTAT) 0.4 MG SL tablet, Place 1 tablet (0.4 mg total) under the tongue every 5 (five)  minutes as needed for chest pain., Disp: 30 tablet, Rfl: 12 .  ONETOUCH DELICA LANCETS 33G 16BC, 1 each by Does not apply route 3 (three) times daily. ICD-10 code: E11.22, Disp: 100 each, Rfl: 12 No current facility-administered medications for this visit.   Facility-Administered Medications Ordered in Other Visits:  .  sodium chloride flush (NS) 0.9 % injection 10 mL, 10 mL, Intracatheter, PRN, GorsAlvy Bimler, MD No Known Allergies   Social History   Socioeconomic History  . Marital status: Single    Spouse name: Not on file  . Number of children: Not on file  . Years of education: Not on file  . Highest education level: Not on file  Social Needs  . Financial resource strain: Not on file  . Food insecurity - worry: Not on file  . Food  insecurity - inability: Not on file  . Transportation needs - medical: Not on file  . Transportation needs - non-medical: Not on file  Occupational History  . Not on file  Tobacco Use  . Smoking status: Former Smoker    Packs/day: 1.00    Years: 46.00    Pack years: 46.00    Types: Cigarettes    Start date: 02/04/1967    Last attempt to quit: 02/03/2013    Years since quitting: 4.0  . Smokeless tobacco: Never Used  . Tobacco comment: Doing well with quitting.  Substance and Sexual Activity  . Alcohol use: No    Alcohol/week: 0.0 oz  . Drug use: No  . Sexual activity: Not Currently  Other Topics Concern  . Not on file  Social History Narrative   Lives with sister, [name] in [place].  Retired - former Sports coach.    Physical Exam  Pulmonary/Chest: No respiratory distress.  Abdominal: He exhibits no distension. There is no tenderness.  Musculoskeletal: He exhibits edema.  Left foot and discolored hx of same but worse today  Skin: Skin is warm and dry. He is not diaphoretic.        Future Appointments  Date Time Provider [place]  02/17/2017  5:00 PM MC-MR 1 [facility]MRI [facility]  02/24/2017 10:15 AM [facility]  [facility]  02/26/2017  9:30 AM Newt Minion, MD PO-NW None  02/27/2017 11:30 AM [facility] LAB ONLY [facility] None  02/27/2017 12:00 PM [facility] I27 DNS [facility] None  02/27/2017  3:30 PM [facility] J32 DNS [facility] None  03/04/2017  3:30 PM MC-CV HS VASC 1 MC-HCVI VVS  03/06/2017  9:30 AM Conrad [place], MD [facility]  03/13/2017 11:30 AM [facility] LAB ONLY [facility] None  03/13/2017 12:00 PM [facility] B6 [facility] None  03/13/2017  3:30 PM [facility] INJ NURSE [facility] None  03/26/2017 12:00 PM [facility] LAB 6 [facility] None  03/26/2017 12:30 PM Gorsuch, Ni, MD [facility] None  03/26/2017  1:30 PM [facility] A3 [facility] None  03/27/2017  4:00 PM [facility] INJ NURSE [facility] None  04/10/2017 11:30 AM [facility] LAB ONLY [facility] None  04/10/2017 12:00 PM [facility] A1 [facility] None  04/10/2017  3:30 PM [facility] INJ NURSE [facility] None  04/24/2017 11:30 AM [facility] LAB 5 [facility] None  04/24/2017 12:00 PM [facility] A3 [facility] None  04/24/2017  4:00 PM [facility] J32 DNS [facility] None  05/06/2017  2:45 PM Gardiner Barefoot, DPM [facility]    ATF pt CAO x4 sitting in the living room sleeping. Pt stated that he has an appointment today at 4 @ [facility] to take MRI of left foot which is a lot more swollen today than last week. pt stated that he still have a lot of pain in his foot today.  Pt took his evening medications this am and just took his morning medications during our visit.  He is still struggling with maintaining a low sodium diet. He ate satine crackers and ham prior to taking his medications today.  Last week the scale was broken but he stated is fixed today.  He has no other complaints today besides foot pain. Pt's sister will be taking him to the hospital today for the procedure.  Pt missed fri, sat and sun evening medications this week and he still refuse to take the afternoon hydralazine.  rx bottles verified and pill  Box  refilled.       BP 110/70 (BP Location: Right Arm, Patient Position: Sitting, Cuff  Size: Normal)   Pulse 66   Resp 16   Wt 183 lb (83 kg)   SpO2 98%   BMI 27.83 kg/m   Weight yesterday-cant remember for sure but think that its the same Last visit weight-couldn't weigh last week due to the scale  Being out of service; I verified the same on scene   cbg 341 pt stated that he ate some cookies last night.  ** warafin placed according to lab work done on 02/10/17 **rx called in: isos pantoprazole  Jermaine Tholl, EMT Paramedic 02/17/2017    ACTION: Home visit completed Next visit planned for next week

## 2017-02-20 ENCOUNTER — Other Ambulatory Visit: Payer: Self-pay | Admitting: Family Medicine

## 2017-02-20 ENCOUNTER — Telehealth: Payer: Self-pay | Admitting: Family Medicine

## 2017-02-20 NOTE — Telephone Encounter (Signed)
Called Mr. Speros to discuss his MRI results of his Left foot. There does not appear to be definite osteomyelitis, radiologist felt the changes seen were more chronic in nature and not infectious. ?Combination of gout or amyloidosis.   Advised that he continue to keep his appt with Dr. Sharol Given and the vascular specialists, will continue to follow with their recommendations.   Smitty Cords, MD [place], PGY-3

## 2017-02-24 ENCOUNTER — Other Ambulatory Visit (HOSPITAL_COMMUNITY): Payer: Self-pay | Admitting: Cardiology

## 2017-02-24 ENCOUNTER — Other Ambulatory Visit (HOSPITAL_COMMUNITY): Payer: Self-pay

## 2017-02-24 ENCOUNTER — Ambulatory Visit (INDEPENDENT_AMBULATORY_CARE_PROVIDER_SITE_OTHER): Payer: Medicare Other | Admitting: *Deleted

## 2017-02-24 DIAGNOSIS — I82512 Chronic embolism and thrombosis of left femoral vein: Secondary | ICD-10-CM | POA: Diagnosis not present

## 2017-02-24 DIAGNOSIS — Z951 Presence of aortocoronary bypass graft: Secondary | ICD-10-CM | POA: Diagnosis not present

## 2017-02-24 DIAGNOSIS — Z5181 Encounter for therapeutic drug level monitoring: Secondary | ICD-10-CM

## 2017-02-24 DIAGNOSIS — Z7901 Long term (current) use of anticoagulants: Secondary | ICD-10-CM

## 2017-02-24 LAB — POCT INR: INR: 1.3

## 2017-02-24 NOTE — Progress Notes (Signed)
Paramedicine Encounter    Patient ID: Leonard [name], male    DOB: 08/09/1950, 67 y.o.   MRN: [id number]   Patient Care Team: [name], MD as PCP - General (Family Medicine) [name], MD as Consulting Physician (Nephrology)  Patient Active Problem List   Diagnosis Date Noted  . Atherosclerotic peripheral vascular disease with gangrene ([place]) 02/14/2017  . Encounter for vitamin deficiency screening 02/11/2017  . Collapse of left talus 01/14/2017  . Diabetic polyneuropathy associated with type 2 diabetes mellitus ([place]) 01/14/2017  . 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 Idiopathic chronic gout of left foot without tophus   . Left foot pain 10/29/2016  . Long term (current) use of anticoagulants [Z79.01] 09/09/2016  . Chronic deep vein thrombosis (DVT) of femoral vein of left lower extremity ([place]) 09/03/2016  . Morbid obesity due to excess calories ([place]) 03/17/2016  . Anemia due to stage 4 chronic kidney disease treated with darbepoetin ([place]) 02/28/2016  . Fatigue associated with anemia 01/21/2016  . Diabetic retinopathy ([place]) 11/13/2015  . CHF (congestive heart failure) ([place]) 08/27/2015  . Iron deficiency anemia 08/03/2015  . Claudication ([place])   . Diabetes ([place])   . Hematuria 06/27/2015  . Skin lesion of face 02/22/2015  . 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 Deficiency anemia 01/15/2015  . CKD (chronic kidney disease) stage 4, GFR 15-29 ml/min (HCC) 01/15/2015  . Hypothyroidism 11/22/2014  . COPD GOLD III  02/16/2014  . History of tobacco use 08/23/2013  . 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 CAD (coronary artery disease) 04/06/2013  . PVD - hx of Rt SFA PTA and s/p Rt 4-5th toe amp Dec 2014 04/05/2013  . Essential hypertension 11/25/2012  . Mixed hyperlipidemia 07/17/2008    Current Outpatient Medications:  .  allopurinol (ZYLOPRIM) 100 MG tablet, TAKE ONE TABLET TWICE DAILY (Patient taking differently:  TAKE ONE TABLET (14m) TWICE DAILY), Disp: 60 tablet, Rfl: 0 .  amLODipine (NORVASC) 10 MG tablet, Take 1 tablet (10 mg total) by mouth daily., Disp: 30 tablet, Rfl: 6 .  atorvastatin (LIPITOR) 40 MG tablet, TAKE ONE (1) TABLET BY MOUTH EVERY DAY AT 6:00PM (Patient taking differently: Take 40 mg by mouth once a day at 6 PM), Disp: 90 tablet, Rfl: 3 .  Blood Glucose Monitoring Suppl (ONETOUCH VERIO FLEX SYSTEM) w/Device KIT, 1 kit by Does not apply route daily., Disp: 1 kit, Rfl: 3 .  carvedilol (COREG) 25 MG tablet, TAKE TWO (2) TABLETS BY MOUTH 2 TIMES DAILY WITH A MEAL (Patient taking differently: TAKE TWO TABLETS (58m BY MOUTH 2 TIMES DAILY WITH A MEAL), Disp: 120 tablet, Rfl: 3 .  cloNIDine (CATAPRES) 0.2 MG tablet, TAKE TWO (2) TABLETS BY MOUTH 2 TIMES DAILY (Patient taking differently: Take 0.4 mg by mouth two times a day), Disp: 120 tablet, Rfl: 3 .  Darbepoetin Alfa (ARANESP) 500 MCG/ML SOSY injection, Inject 500 mcg into the skin every 14 (fourteen) days. , Disp: , Rfl:  .  doxazosin (CARDURA) 8 MG tablet, Take 1 tablet (8 mg total) by mouth every evening., Disp: , Rfl:  .  ferrous sulfate 325 (65 FE) MG tablet, TAKE ONE (1) TABLET BY MOUTH TWO (2) TIMES DAILY WITH A MEAL (Patient taking differently: Take 325 mg by mouth two times a day with a meal), Disp: 60 tablet, Rfl: 3 .  ferumoxytol 510 mg in sodium chloride 0.9 % 100 mL, Inject into the vein every 7 (seven) days. ,  Disp: , Rfl:  .  glucose blood (ONETOUCH VERIO) test strip, Use as instructed to test three times daily. ICD-10 code: E11.22, Disp: 100 each, Rfl: 12 .  hydrALAZINE (APRESOLINE) 100 MG tablet, TAKE ONE TABLET BY MOUTH EVERY EIGHT HOURS (Patient taking differently: Take 100 mg by mouth every 8 hours), Disp: 90 tablet, Rfl: 1 .  insulin aspart (NOVOLOG FLEXPEN) 100 UNIT/ML FlexPen, Inject 15 Units 2 (two) times daily before lunch and supper into the skin., Disp: 15 mL, Rfl: 5 .  Insulin Glargine (LANTUS SOLOSTAR) 100 UNIT/ML  Solostar Pen, Inject 50 Units into the skin every morning. (Patient taking differently: Inject 50 Units into the skin daily after breakfast. ), Disp: 15 mL, Rfl: 3 .  isosorbide mononitrate (IMDUR) 60 MG 24 hr tablet, TAKE ONE (1) TABLET BY MOUTH EVERY DAY (Patient taking differently: TAKE ONE TABLET (51m) BY MOUTH EVERY DAY), Disp: 30 tablet, Rfl: 3 .  levothyroxine (SYNTHROID, LEVOTHROID) 75 MCG tablet, Take 1 tablet (75 mcg total) by mouth daily., Disp: 90 tablet, Rfl: 1 .  ONETOUCH DELICA LANCETS 335KMISC, 1 each by Does not apply route 3 (three) times daily. ICD-10 code: E11.22, Disp: 100 each, Rfl: 12 .  pantoprazole (PROTONIX) 40 MG tablet, TAKE ONE (1) TABLET BY MOUTH EVERY DAY (Patient taking differently: TAKE ONE TABLET (486m BY MOUTH EVERY DAY), Disp: 90 tablet, Rfl: 0 .  potassium chloride SA (K-DUR,KLOR-CON) 20 MEQ tablet, Take 20 mEq by mouth 2 (two) times daily. , Disp: , Rfl:  .  torsemide (DEMADEX) 20 MG tablet, Take 3 tablets (60 mg total) by mouth daily. May take an extra 20 mg Daily for weight gain above 212 lbs (Patient taking differently: Take 20 mg by mouth 3 (three) times daily. And may take an extra 20 mg daily for weight gain above 212 lbs), Disp: 90 tablet, Rfl: 6 .  traMADol (ULTRAM) 50 MG tablet, Take 1 tablet (50 mg total) by mouth every 6 (six) hours as needed for moderate pain., Disp: 60 tablet, Rfl: 0 .  warfarin (COUMADIN) 5 MG tablet, Take 2.5 mg (1/2 tablet) daily except 5 mg (1 tablet) on Monday and Friday, Disp: 18 tablet, Rfl: 1 .  aspirin 81 MG chewable tablet, Chew 81 mg by mouth daily., Disp: , Rfl:  .  Colchicine 0.6 MG CAPS, TAKE ONE (1) CAPSULE BY MOUTH 2 TIMES DAILY (Patient not taking: Reported on 02/24/2017), Disp: 30 capsule, Rfl: 0 .  enoxaparin (LOVENOX) 100 MG/ML injection, Inject 0.9 mLs (90 mg total) into the skin daily. Please inject ONLY 0.9 mLs (9057motal) on 12/04/16. Do NOT use whole syringe, Disp: 0.9 mL, Rfl: 0 .  nitroGLYCERIN (NITROSTAT) 0.4  MG SL tablet, Place 1 tablet (0.4 mg total) under the tongue every 5 (five) minutes as needed for chest pain. (Patient not taking: Reported on 02/24/2017), Disp: 30 tablet, Rfl: 12 No current facility-administered medications for this visit.   Facility-Administered Medications Ordered in Other Visits:  .  sodium chloride flush (NS) 0.9 % injection 10 mL, 10 mL, Intracatheter, PRN, GorAlvy Bimleri, MD No Known Allergies   Social History   Socioeconomic History  . Marital status: Single    Spouse name: Not on file  . Number of children: Not on file  . Years of education: Not on file  . Highest education level: Not on file  Social Needs  . Financial resource strain: Not on file  . Food insecurity - worry: Not on file  . Food insecurity -  inability: Not on file  . Transportation needs - medical: Not on file  . Transportation needs - non-medical: Not on file  Occupational History  . Not on file  Tobacco Use  . Smoking status: Former Smoker    Packs/day: 1.00    Years: 46.00    Pack years: 46.00    Types: Cigarettes    Start date: 02/04/1967    Last attempt to quit: 02/03/2013    Years since quitting: 4.0  . Smokeless tobacco: Never Used  . Tobacco comment: Doing well with quitting.  Substance and Sexual Activity  . Alcohol use: No    Alcohol/week: 0.0 oz  . Drug use: No  . Sexual activity: Not Currently  Other Topics Concern  . Not on file  Social History Narrative   Lives with sister, [name] in [place].  Retired - former Sports coach.    Physical Exam      Future Appointments  Date Time Provider [place]  02/26/2017  9:30 AM Newt Minion, MD PO-NW None  02/27/2017 11:30 AM [facility] LAB 2 [facility] None  02/27/2017 12:00 PM [facility] I27 DNS [facility] None  02/27/2017  3:30 PM [facility] J32 DNS [facility] None  03/03/2017 11:00 AM [facility]  03/04/2017  3:30 PM MC-CV HS VASC 1 MC-HCVI VVS  03/06/2017  9:30  AM Conrad [place], MD [facility]  03/13/2017 11:30 AM [facility] LAB ONLY [facility] None  03/13/2017 12:00 PM [facility] B6 [facility] None  03/13/2017  3:30 PM [facility] INJ NURSE [facility] None  03/26/2017 12:00 PM [facility] LAB 6 [facility] None  03/26/2017 12:30 PM Gorsuch, Ni, MD [facility] None  03/26/2017  1:30 PM [facility] A3 [facility] None  03/27/2017  4:00 PM [facility] INJ NURSE [facility] None  04/10/2017 11:30 AM [facility] LAB ONLY [facility] None  04/10/2017 12:00 PM [facility] A1 [facility] None  04/10/2017  3:30 PM [facility] INJ NURSE [facility] None  04/24/2017 11:30 AM [facility] LAB 5 [facility] None  04/24/2017 12:00 PM [facility] A3 [facility] None  04/24/2017  4:00 PM [facility] J32 DNS [facility] None  05/06/2017  2:45 PM Gardiner Barefoot, DPM [facility]   BP 126/60   Resp 15   Wt 188 lb (85.3 kg)   SpO2 98%   BMI 28.59 kg/m  Weight yesterday-? Last visit weight-183 [facility] EMS-170  Pt reports he isnt feeling too hot today, he went for his INR check today and it was low. He is eating candy during our visit.  He has missed a ton of med doses--he missed 2 am doses. All of his noon time hydralazine doses and only took 2 doses of his pm meds which has the coumadin in it. He states he falls asleep a lot and misses it.  He asked what the coumadin was for and I advised him it was a blood thinner and advised him of the risks of him missing doses or taking it incorrectly and he said he was going to straighten up and do better. I asked how he was going to do that and he said after me telling him what could happen he wants to do better.  meds verified and pill box refilled the few days he had empty. Carvedilol and warfarin was ordered for him and he also needed insulin--majority of his novolog is expired so that was ordered too. Pharmacy will make delivery tomor.     ACTION: Home visit completed  Marylouise Stacks, EMT-Paramedic 02/24/17

## 2017-02-24 NOTE — Patient Instructions (Signed)
Description   TodayJan 22nd  take 1 tablet (5mg ) then tomorrow Jan 23rd take 1 tablet (5mg ) then continue taking 2.5 mg (1/2 tablet) daily except 5 mg(1 tablet) on Fridays. Recheck in 1 week. Call if placed on any new medications or if scheduled for any procedures or surgery [phone].

## 2017-02-25 ENCOUNTER — Other Ambulatory Visit (HOSPITAL_COMMUNITY): Payer: Self-pay | Admitting: *Deleted

## 2017-02-25 MED ORDER — WARFARIN SODIUM 5 MG PO TABS: ORAL_TABLET | ORAL | 1 refills | Status: DC

## 2017-02-26 ENCOUNTER — Encounter (INDEPENDENT_AMBULATORY_CARE_PROVIDER_SITE_OTHER): Payer: Self-pay | Admitting: Orthopedic Surgery

## 2017-02-26 ENCOUNTER — Ambulatory Visit (INDEPENDENT_AMBULATORY_CARE_PROVIDER_SITE_OTHER): Payer: Medicare Other | Admitting: Orthopedic Surgery

## 2017-02-26 VITALS — Ht 68.0 in | Wt 188.0 lb

## 2017-02-26 DIAGNOSIS — E1152 Type 2 diabetes mellitus with diabetic peripheral angiopathy with gangrene: Secondary | ICD-10-CM | POA: Diagnosis not present

## 2017-02-26 DIAGNOSIS — E1142 Type 2 diabetes mellitus with diabetic polyneuropathy: Secondary | ICD-10-CM

## 2017-02-26 DIAGNOSIS — I70262 Atherosclerosis of native arteries of extremities with gangrene, left leg: Secondary | ICD-10-CM

## 2017-02-26 DIAGNOSIS — I5030 Unspecified diastolic (congestive) heart failure: Secondary | ICD-10-CM | POA: Diagnosis not present

## 2017-02-26 DIAGNOSIS — I2510 Atherosclerotic heart disease of native coronary artery without angina pectoris: Secondary | ICD-10-CM | POA: Diagnosis not present

## 2017-02-26 DIAGNOSIS — I739 Peripheral vascular disease, unspecified: Secondary | ICD-10-CM | POA: Diagnosis not present

## 2017-02-26 DIAGNOSIS — I70269 Atherosclerosis of native arteries of extremities with gangrene, unspecified extremity: Secondary | ICD-10-CM | POA: Diagnosis not present

## 2017-02-26 DIAGNOSIS — E1122 Type 2 diabetes mellitus with diabetic chronic kidney disease: Secondary | ICD-10-CM | POA: Diagnosis not present

## 2017-02-26 DIAGNOSIS — N2581 Secondary hyperparathyroidism of renal origin: Secondary | ICD-10-CM | POA: Diagnosis not present

## 2017-02-26 DIAGNOSIS — J449 Chronic obstructive pulmonary disease, unspecified: Secondary | ICD-10-CM | POA: Diagnosis not present

## 2017-02-26 DIAGNOSIS — F17201 Nicotine dependence, unspecified, in remission: Secondary | ICD-10-CM | POA: Diagnosis not present

## 2017-02-26 DIAGNOSIS — N179 Acute kidney failure, unspecified: Secondary | ICD-10-CM | POA: Diagnosis not present

## 2017-02-26 DIAGNOSIS — I129 Hypertensive chronic kidney disease with stage 1 through stage 4 chronic kidney disease, or unspecified chronic kidney disease: Secondary | ICD-10-CM | POA: Diagnosis not present

## 2017-02-26 DIAGNOSIS — N184 Chronic kidney disease, stage 4 (severe): Secondary | ICD-10-CM | POA: Diagnosis not present

## 2017-02-26 DIAGNOSIS — D631 Anemia in chronic kidney disease: Secondary | ICD-10-CM | POA: Diagnosis not present

## 2017-02-26 NOTE — Progress Notes (Signed)
Office Visit Note   Patient: Leonard [name]           Date of Birth: 11-Oct-1950           MRN: [id number] Visit Date: 02/26/2017              Requested by: [name], MD [address] [place], [place] [address] PCP: [name], MD  Chief Complaint  Patient presents with  . Left Foot - Follow-up    3rd toe gangrene.       HPI: Patient presents in follow-up for gangrene left foot second third and fourth toes.  At patient's last visit a referral was made for vascular workup for the dry gangrene to include ankle-brachial indices.  Patient was offered an appointment the next day after being worked in from our office patient refused the consultation with vascular surgery.  Patient presents at this time with painful dry gangrene of the second third and fourth toes there is odor and pain.  Patient is currently drinking a Coke is diabetic.  Assessment & Plan: Visit Diagnoses:  1. Diabetic polyneuropathy associated with type 2 diabetes mellitus ([place])   2. Atherosclerosis of native artery of left lower extremity with gangrene [facility])     Plan: Discussed with patient the reasoning for obtaining ankle-brachial indices was to see if there was any revascularization options to improve the circulation to his foot.  Discussed with the dry gangrene of the toes and the pain and odor the least invasive option would be to proceed with a transmetatarsal amputation.  Discussed that if the infection progresses he could require a below the knee amputation.  Patient states he understands does not want to proceed with surgery.  We will follow-up in 2 weeks.  Patient will go to the emergency room if he develops any further symptoms.  Follow-Up Instructions: Return in about 2 weeks (around 03/12/2017).   Ortho Exam  Patient is alert, oriented, no adenopathy, well-dressed, normal affect, normal respiratory effort. Examination patient does have swelling of the left foot there is no a sending  cellulitis.  Patient has thin and atrophic black gangrenous changes of the second third and fourth toes there is an odor.  Patient has tenderness to palpation through the forefoot.  There is no crepitation.  A Doppler was used and patient has a strong biphasic dorsalis pedis pulse.  Imaging: No results found. No images are attached to the encounter.  Labs: Lab Results  Component Value Date   HGBA1C 6.9 01/05/2017   HGBA1C 11.0 10/10/2016   HGBA1C 11.1 07/01/2016   ESRSEDRATE 117 (H) 12/29/2016   ESRSEDRATE 118 (H) 12/18/2016   ESRSEDRATE 130 (H) 12/01/2016   CRP 7.3 (H) 12/01/2016   CRP <0.5 (L) 04/04/2013   CRP 2.6 (H) 02/16/2013   LABURIC 5.3 12/29/2016   LABURIC 7.7 (H) 12/02/2016   LABURIC 10.9 (H) 09/17/2016   REPTSTATUS 02/10/2017 FINAL 02/07/2017   GRAMSTAIN  01/28/2013    RARE WBC PRESENT, PREDOMINANTLY PMN NO SQUAMOUS EPITHELIAL CELLS SEEN NO ORGANISMS SEEN Performed at [place]  01/28/2013    RARE WBC PRESENT, PREDOMINANTLY PMN NO SQUAMOUS EPITHELIAL CELLS SEEN NO ORGANISMS SEEN Performed at [place] >=100,000 COLONIES/mL ESCHERICHIA COLI (A) 02/07/2017   LABORGA ESCHERICHIA COLI (A) 02/07/2017    @LABSALLVALUES (HGBA1)@  Body mass index is 28.59 kg/m.  Orders:  No orders of the defined types were placed in this encounter.  No orders of  the defined types were placed in this encounter.    Procedures: No procedures performed  Clinical Data: No additional findings.  ROS:  All other systems negative, except as noted in the HPI. Review of Systems  Objective: Vital Signs: Ht 5\' 8"  (1.727 m)   Wt 188 lb (85.3 kg)   BMI 28.59 kg/m   Specialty Comments:  No specialty comments available.  PMFS History: Patient Active Problem List   Diagnosis Date Noted  . Atherosclerotic peripheral vascular disease with gangrene ([place]) 02/14/2017  . Encounter for vitamin deficiency screening 02/11/2017  . Collapse of left  talus 01/14/2017  . Diabetic polyneuropathy associated with type 2 diabetes mellitus ([place]) 01/14/2017  . 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 Idiopathic chronic gout of left foot without tophus   . Left foot pain 10/29/2016  . Long term (current) use of anticoagulants [Z79.01] 09/09/2016  . Chronic deep vein thrombosis (DVT) of femoral vein of left lower extremity ([place]) 09/03/2016  . Morbid obesity due to excess calories ([place]) 03/17/2016  . Anemia due to stage 4 chronic kidney disease treated with darbepoetin ([place]) 02/28/2016  . Fatigue associated with anemia 01/21/2016  . Diabetic retinopathy ([place]) 11/13/2015  . CHF (congestive heart failure) ([place]) 08/27/2015  . Iron deficiency anemia 08/03/2015  . Claudication ([place])   . Diabetes ([place])   . Hematuria 06/27/2015  . Skin lesion of face 02/22/2015  . 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 Deficiency anemia 01/15/2015  . CKD (chronic kidney disease) stage 4, GFR 15-29 ml/min (HCC) 01/15/2015  . Hypothyroidism 11/22/2014  . COPD GOLD III  02/16/2014  . History of tobacco use 08/23/2013  . 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 CAD (coronary artery disease) 04/06/2013  . PVD - hx of Rt SFA PTA and s/p Rt 4-5th toe amp Dec 2014 04/05/2013  . Essential hypertension 11/25/2012  . Mixed hyperlipidemia 07/17/2008   Past Medical History:  Diagnosis Date  . Cancer ([place])    skin cancer-  . CHF (congestive heart failure) ([place])   . Chronic kidney disease    not on dialysis  . Coronary artery disease   . Gangrene of toe ([place])    right 5th/notes 01/04/2013   . High cholesterol   . Hypertension   . Iron deficiency anemia 08/03/2015  . 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 PAD (peripheral artery disease) ([place])   . Shortness of breath dyspnea    with exertion  . Type II diabetes mellitus (HCC)    Type II    Family History  Problem Relation Age of Onset  . Cancer  Mother        lung cancer  . Hypertension Mother   . Cancer Sister        patient thinks it was uterine cancer  . Hypertension Sister   . Heart attack Sister   . Stroke Neg Hx     Past Surgical History:  Procedure Laterality Date  . AMPUTATION Right 01/28/2013   Procedure: RAY AMPUTATION RIGHT  4th & 5th TOE;  Surgeon: Rosetta Posner, MD;  Location: [facility] OR;  Service: Vascular;  Laterality: Right;  . ANGIOPLASTY / STENTING FEMORAL Right 01/13/2013   superficial femoral artery x 2 (3 mm x 60 mm, 4 mm x 60 mm)  . AV FISTULA PLACEMENT Left 02/11/2016   Procedure: LEFT UPPER ARM BRACHIAL CEPHALIC ARTERIOVENOUS (AV) FISTULA CREATION;  Surgeon: Conrad [place], MD;  Location: [facility]  OR;  Service: Vascular;  Laterality: Left;  . COLONOSCOPY W/ POLYPECTOMY    . CORONARY ARTERY BYPASS GRAFT N/A 04/07/2013   Procedure: CORONARY ARTERY BYPASS GRAFTING (CABG);  Surgeon: Ivin Poot, MD;  Location: [place];  Service: Open Heart Surgery;  Laterality: N/A;  . INTRAOPERATIVE TRANSESOPHAGEAL ECHOCARDIOGRAM N/A 04/07/2013   Procedure: INTRAOPERATIVE TRANSESOPHAGEAL ECHOCARDIOGRAM;  Surgeon: Ivin Poot, MD;  Location: [place];  Service: Open Heart Surgery;  Laterality: N/A;  . LEFT HEART CATHETERIZATION WITH CORONARY ANGIOGRAM N/A 04/05/2013   Procedure: LEFT HEART CATHETERIZATION WITH CORONARY ANGIOGRAM;  Surgeon: Peter M Martinique, MD;  Location[facility] CATH LAB;  Service: Cardiovascular;  Laterality: N/A;  . LOWER EXTREMITY ANGIOGRAM Bilateral 01/06/2013   Procedure: LOWER EXTREMITY ANGIOGRAM;  Surgeon: Conrad Aceitunas, MD;  Location: [facility] CATH LAB;  Service: Cardiovascular;  Laterality: Bilateral;  . LOWER EXTREMITY ANGIOGRAM Right 01/13/2013   Procedure: LOWER EXTREMITY ANGIOGRAM;  Surgeon: Conrad , MD;  Location: [facility] CATH LAB;  Service: Cardiovascular;  Laterality: Right;  . SKIN CANCER EXCISION  2017  . THROAT SURGERY  1983   polyps  . TONSILLECTOMY AND ADENOIDECTOMY  ~ 1965   Social History   Occupational History    . Not on file  Tobacco Use  . Smoking status: Former Smoker    Packs/day: 1.00    Years: 46.00    Pack years: 46.00    Types: Cigarettes    Start date: 02/04/1967    Last attempt to quit: 02/03/2013    Years since quitting: 4.0  . Smokeless tobacco: Never Used  . Tobacco comment: Doing well with quitting.  Substance and Sexual Activity  . Alcohol use: No    Alcohol/week: 0.0 oz  . Drug use: No  . Sexual activity: Not Currently

## 2017-02-27 ENCOUNTER — Inpatient Hospital Stay: Payer: Medicare Other

## 2017-02-27 VITALS — BP 145/57 | HR 77 | Temp 98.7°F | Resp 17

## 2017-02-27 DIAGNOSIS — D631 Anemia in chronic kidney disease: Secondary | ICD-10-CM

## 2017-02-27 DIAGNOSIS — N184 Chronic kidney disease, stage 4 (severe): Secondary | ICD-10-CM | POA: Diagnosis not present

## 2017-02-27 DIAGNOSIS — N189 Chronic kidney disease, unspecified: Principal | ICD-10-CM

## 2017-02-27 DIAGNOSIS — D509 Iron deficiency anemia, unspecified: Secondary | ICD-10-CM | POA: Diagnosis not present

## 2017-02-27 DIAGNOSIS — Z79899 Other long term (current) drug therapy: Secondary | ICD-10-CM | POA: Diagnosis not present

## 2017-02-27 LAB — SAMPLE TO BLOOD BANK

## 2017-02-27 LAB — CBC WITH DIFFERENTIAL/PLATELET
BASOS PCT: 0 %
Basophils Absolute: 0 10*3/uL (ref 0.0–0.1)
EOS ABS: 0.1 10*3/uL (ref 0.0–0.5)
EOS PCT: 2 %
HCT: 28 % — ABNORMAL LOW (ref 38.4–49.9)
Hemoglobin: 8.5 g/dL — ABNORMAL LOW (ref 13.0–17.1)
LYMPHS ABS: 0.8 10*3/uL — AB (ref 0.9–3.3)
Lymphocytes Relative: 12 %
MCH: 22.3 pg — AB (ref 27.2–33.4)
MCHC: 30.4 g/dL — AB (ref 32.0–36.0)
MCV: 73.5 fL — ABNORMAL LOW (ref 79.3–98.0)
MONOS PCT: 9 %
Monocytes Absolute: 0.6 10*3/uL (ref 0.1–0.9)
NEUTROS PCT: 77 %
Neutro Abs: 5 10*3/uL (ref 1.5–6.5)
PLATELETS: 249 10*3/uL (ref 140–400)
RBC: 3.81 MIL/uL — AB (ref 4.20–5.82)
RDW: 19.6 % — ABNORMAL HIGH (ref 11.0–15.6)
WBC: 6.5 10*3/uL (ref 4.0–10.3)

## 2017-02-27 MED ORDER — DARBEPOETIN ALFA 500 MCG/ML IJ SOSY
PREFILLED_SYRINGE | INTRAMUSCULAR | Status: AC
Start: 2017-02-27 — End: 2017-02-27
  Filled 2017-02-27: qty 1

## 2017-02-27 MED ORDER — DARBEPOETIN ALFA 500 MCG/ML IJ SOSY
PREFILLED_SYRINGE | INTRAMUSCULAR | Status: AC
  Filled 2017-02-27: qty 1

## 2017-02-27 MED ORDER — DARBEPOETIN ALFA 500 MCG/ML IJ SOSY
500.0000 ug | PREFILLED_SYRINGE | Freq: Once | INTRAMUSCULAR | Status: AC
  Administered 2017-02-27: 500 ug via SUBCUTANEOUS

## 2017-02-27 NOTE — Patient Instructions (Signed)

## 2017-02-27 NOTE — Progress Notes (Signed)
[employer] made aware of patient being ready to be picked up.

## 2017-03-02 NOTE — Progress Notes (Signed)
Established Critical Limb Ischemia Patient   History of Present Illness   Leonard [name] is a 67 y.o. (01-27-1951) male who presents with chief complaint: L foot gangrene.    The patient has previously undergone: 1.  OA+PTA R SFA (01/13/13) 2.  Open amp R 4th and 5th toe by Dr. Donnetta Hutching (01/28/13)  Reportedly the patient was being followed in his cardiology office.  In May/June some darkening of the left foot was noted but was felt to be more gout symptoms.  Over the last 2 weeks, family notes marked change in the left foot.  Initially he had gangrenous changes to the L 3rd toe.  This has progressed and now he has drainage from the foot.  The patient has no rest pain.  The patient's treatment regimen currently included: maximal medical management and wound care with Dr. Sharol Given.  Past Medical History:  Diagnosis Date  . Cancer ([place])    skin cancer-  . CHF (congestive heart failure) ([place])   . Chronic kidney disease    not on dialysis  . Coronary artery disease   . Gangrene of toe ([place])    right 5th/notes 01/04/2013   . High cholesterol   . Hypertension   . Iron deficiency anemia 08/03/2015  . 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 PAD (peripheral artery disease) ([place])   . Shortness of breath dyspnea    with exertion  . Type II diabetes mellitus ([place])    Type II    Past Surgical History:  Procedure Laterality Date  . AMPUTATION Right 01/28/2013   Procedure: RAY AMPUTATION RIGHT  4th & 5th TOE;  Surgeon: Rosetta Posner, MD;  Location: [facility] OR;  Service: Vascular;  Laterality: Right;  . ANGIOPLASTY / STENTING FEMORAL Right 01/13/2013   superficial femoral artery x 2 (3 mm x 60 mm, 4 mm x 60 mm)  . AV FISTULA PLACEMENT Left 02/11/2016   Procedure: LEFT UPPER ARM BRACHIAL CEPHALIC ARTERIOVENOUS (AV) FISTULA CREATION;  Surgeon: Conrad [place]burg, MD;  Location: [place];  Service: Vascular;  Laterality: Left;  . COLONOSCOPY W/ POLYPECTOMY    . CORONARY ARTERY BYPASS  GRAFT N/A 04/07/2013   Procedure: CORONARY ARTERY BYPASS GRAFTING (CABG);  Surgeon: Ivin Poot, MD;  Location: [place];  Service: Open Heart Surgery;  Laterality: N/A;  . INTRAOPERATIVE TRANSESOPHAGEAL ECHOCARDIOGRAM N/A 04/07/2013   Procedure: INTRAOPERATIVE TRANSESOPHAGEAL ECHOCARDIOGRAM;  Surgeon: Ivin Poot, MD;  Location: [place];  Service: Open Heart Surgery;  Laterality: N/A;  . LEFT HEART CATHETERIZATION WITH CORONARY ANGIOGRAM N/A 04/05/2013   Procedure: LEFT HEART CATHETERIZATION WITH CORONARY ANGIOGRAM;  Surgeon: Peter M Martinique, MD;  Location: [facility] CATH LAB;  Service: Cardiovascular;  Laterality: N/A;  . LOWER EXTREMITY ANGIOGRAM Bilateral 01/06/2013   Procedure: LOWER EXTREMITY ANGIOGRAM;  Surgeon: Conrad Plummer, MD;  Location: [facility] CATH LAB;  Service: Cardiovascular;  Laterality: Bilateral;  . LOWER EXTREMITY ANGIOGRAM Right 01/13/2013   Procedure: LOWER EXTREMITY ANGIOGRAM;  Surgeon: Conrad [place], MD;  Location: [facility] CATH LAB;  Service: Cardiovascular;  Laterality: Right;  . SKIN CANCER EXCISION  2017  . THROAT SURGERY  1983   polyps  . TONSILLECTOMY AND ADENOIDECTOMY  ~ 1965    Social History   Socioeconomic History  . Marital status: Single    Spouse name: Not on file  . Number of children: Not on file  . Years of education: Not on file  . Highest education level: Not on file  Social Needs  . Financial resource strain: Not on file  . Food insecurity - worry: Not on file  . Food insecurity - inability: Not on file  . Transportation needs - medical: Not on file  . Transportation needs - non-medical: Not on file  Occupational History  . Not on file  Tobacco Use  . Smoking status: Former Smoker    Packs/day: 1.00    Years: 46.00    Pack years: 46.00    Types: Cigarettes    Start date: 02/04/1967    Last attempt to quit: 02/03/2013    Years since quitting: 4.0  . Smokeless tobacco: Never Used  . Tobacco comment: Doing well with quitting.  Substance and Sexual Activity  .  Alcohol use: No    Alcohol/week: 0.0 oz  . Drug use: No  . Sexual activity: Not Currently  Other Topics Concern  . Not on file  Social History Narrative   Lives with sister, [name] in [place].  Retired - former Sports coach.    Family History  Problem Relation Age of Onset  . Cancer Mother        lung cancer  . Hypertension Mother   . Cancer Sister        patient thinks it was uterine cancer  . Hypertension Sister   . Heart attack Sister   . Stroke Neg Hx     Current Outpatient Medications  Medication Sig Dispense Refill  . allopurinol (ZYLOPRIM) 100 MG tablet Take 100 mg by mouth daily.    Marland Kitchen amLODipine (NORVASC) 10 MG tablet Take 1 tablet (10 mg total) by mouth daily. 30 tablet 6  . aspirin 81 MG chewable tablet Chew 81 mg by mouth daily.    Marland Kitchen atorvastatin (LIPITOR) 40 MG tablet TAKE ONE (1) TABLET BY MOUTH EVERY DAY AT 6:00PM (Patient taking differently: Take 40 mg by mouth once a day at 6 PM) 90 tablet 3  . Blood Glucose Monitoring Suppl (ONETOUCH VERIO FLEX SYSTEM) w/Device KIT 1 kit by Does not apply route daily. 1 kit 3  . carvedilol (COREG) 25 MG tablet TAKE TWO (2) TABLETS BY MOUTH 2 TIMES DAILY WITH A MEAL (Patient taking differently: TAKE TWO TABLETS (59m) BY MOUTH 2 TIMES DAILY WITH A MEAL) 120 tablet 3  . cloNIDine (CATAPRES) 0.2 MG tablet TAKE TWO (2) TABLETS BY MOUTH 2 TIMES DAILY (Patient taking differently: Take 0.4 mg by mouth two times a day) 120 tablet 3  . Darbepoetin Alfa (ARANESP) 500 MCG/ML SOSY injection Inject 500 mcg into the skin every 14 (fourteen) days.     .Marland Kitchendoxazosin (CARDURA) 8 MG tablet Take 1 tablet (8 mg total) by mouth every evening.    .Marland KitchenFEROSUL 325 (65 Fe) MG tablet TAKE ONE (1) TABLET BY MOUTH TWO (2) TIMES DAILY WITH A MEAL 60 tablet 3  . ferumoxytol 510 mg in sodium chloride 0.9 % 100 mL Inject into the vein every 7 (seven) days.     .Marland Kitchenglucose blood (ONETOUCH VERIO) test strip Use as instructed to test three times daily. ICD-10  code: E11.22 100 each 12  . hydrALAZINE (APRESOLINE) 100 MG tablet TAKE ONE TABLET BY MOUTH EVERY EIGHT HOURS (Patient taking differently: Take 100 mg by mouth every 8 hours) 90 tablet 1  . insulin aspart (NOVOLOG FLEXPEN) 100 UNIT/ML FlexPen Inject 15 Units 2 (two) times daily before lunch and supper into the skin. 15 mL 5  . Insulin Glargine (LANTUS SOLOSTAR) 100 UNIT/ML Solostar Pen Inject  50 Units into the skin every morning. (Patient taking differently: Inject 50 Units into the skin daily after breakfast. ) 15 mL 3  . isosorbide mononitrate (IMDUR) 60 MG 24 hr tablet TAKE ONE (1) TABLET BY MOUTH EVERY DAY (Patient taking differently: TAKE ONE TABLET (71m) BY MOUTH EVERY DAY) 30 tablet 3  . levothyroxine (SYNTHROID, LEVOTHROID) 75 MCG tablet Take 1 tablet (75 mcg total) by mouth daily. 90 tablet 1  . nitroGLYCERIN (NITROSTAT) 0.4 MG SL tablet Place 1 tablet (0.4 mg total) under the tongue every 5 (five) minutes as needed for chest pain. 30 tablet 12  . ONETOUCH DELICA LANCETS 376LMISC 1 each by Does not apply route 3 (three) times daily. ICD-10 code: E11.22 100 each 12  . pantoprazole (PROTONIX) 40 MG tablet TAKE ONE (1) TABLET BY MOUTH EVERY DAY (Patient taking differently: TAKE ONE TABLET (45m BY MOUTH EVERY DAY) 90 tablet 0  . potassium chloride SA (K-DUR,KLOR-CON) 20 MEQ tablet Take 20 mEq by mouth 2 (two) times daily.     . Marland Kitchenorsemide (DEMADEX) 20 MG tablet Take 3 tablets (60 mg total) by mouth daily. May take an extra 20 mg Daily for weight gain above 212 lbs (Patient taking differently: Take 20 mg by mouth 3 (three) times daily. And may take an extra 20 mg daily for weight gain above 212 lbs) 90 tablet 6  . traMADol (ULTRAM) 50 MG tablet Take 1 tablet (50 mg total) by mouth every 6 (six) hours as needed for moderate pain. 60 tablet 0  . warfarin (COUMADIN) 5 MG tablet Take 2.5 mg (1/2 tablet) daily except 5 mg (1 tablet) on Monday and Friday 18 tablet 1  . enoxaparin (LOVENOX) 100 MG/ML  injection Inject 0.9 mLs (90 mg total) into the skin daily. Please inject ONLY 0.9 mLs (9024motal) on 12/04/16. Do NOT use whole syringe 0.9 mL 0   No current facility-administered medications for this visit.    Facility-Administered Medications Ordered in Other Visits  Medication Dose Route Frequency Provider Last Rate Last Dose  . sodium chloride flush (NS) 0.9 % injection 10 mL  10 mL Intracatheter PRN GorAlvy Bimleri, MD         No Known Allergies  REVIEW OF SYSTEMS (negative unless checked):   Cardiac:  _0  Chest pain or chest pressure? _1  Shortness of breath upon activity? _2  Shortness of breath when lying flat? _3  Irregular heart rhythm?  Vascular:  _4  Pain in calf, thigh, or hip brought on by walking? _5  Pain in feet at night that wakes you up from your sleep? _6  Blood clot in your veins? _7  Leg swelling?  Pulmonary:  _8  Oxygen at home? _9  Productive cough? _10  Wheezing?  Neurologic:  _11  Sudden weakness in arms or legs? _12  Sudden numbness in arms or legs? _13  Sudden onset of difficult speaking or slurred speech? _14  Temporary loss of vision in one eye? _15  Problems with dizziness?  Gastrointestinal:  _16  Blood in stool? _17  Vomited blood?  Genitourinary:  _18  Burning when urinating? _19  Blood in urine?  Psychiatric:  _20  Major depression  Hematologic:  _21  Bleeding problems? _22  Problems with blood clotting?  Dermatologic:  _23  Rashes or ulcers?  Constitutional:  _24  Fever or chills?  Ear/Nose/Throat:  _25  Change in hearing? _26  Nose bleeds? _27  Sore throat?  Musculoskeletal:  _28  Back pain? _29  Joint pain? _30  Muscle pain?   Physical Examination   Vitals:   03/06/17 0945  BP: (!) 154/67  Pulse: 78  Resp: 20  Temp: 98.4 F (36.9 C)  TempSrc: Oral  SpO2: 100%  Weight: 182 lb (82.6 kg)  Height: _0  (1.727 m)   Body mass index is 27.67 kg/m.  General Alert, O x 3, WD, Ill appearing  Pulmonary Sym exp, good B air movt, CTA B  Cardiac RRR, Nl S1,  S2, no Murmurs, No rubs, No S3,S4  Vascular Vessel Right Left  Radial Palpable Faintly palpable  Brachial Palpable Palpable  Carotid Palpable, No Bruit Palpable, No Bruit  Aorta Not palpable N/A  Femoral Palpable Palpable  Popliteal Not palpable Not palpable  PT Palpable Palpable  DP Palpable Palpable    Gastro- intestinal soft, non-distended, non-tender to palpation, No guarding or rebound, no HSM, no masses, no CVAT B, No palpable prominent aortic pulse,    Musculo- skeletal M/S 5/5 throughout  , R foot with healed R 4th-5th amputations, L foot swollen, no TTP, L 2-4th toe with dry gangrene, L 5th toe ischemic with drainage between L 4/5th toes, L BC AVF w/ thrill and bruit  Neurologic Pain and light touch intact in extremities decreased in L foot, Motor exam as listed above    Non-Invasive Vascular Imaging   ABI (03/04/17)  R:   ABI: ,   PT: mono  DP: mono  TBI:  0.33  L:   ABI: 0.48,   PT: mono  DP: mono  TBI: ND   Medical Decision Making   Leonard [name] is a 67 y.o. male who presents with: BLE critical limb ischemia with L foot gangrene   Based on the patient's vascular studies and examination, I have offered the patient: Aortogram with carbon dioxide, Left leg runoff.  This is scheduled for 7 FEB 19 I discussed with the patient the nature of angiographic procedures, especially the limited patencies of any endovascular intervention.   The patient is aware of that the risks of an angiographic procedure include but are not limited to: bleeding, infection, access site complications, renal failure, embolization, rupture of vessel, dissection, arteriovenous fistula, possible need for emergent surgical intervention, possible need for surgical procedures to treat the patient's pathology, anaphylactic reaction to contrast, and stroke and death.   The patient is aware of the risks and agrees to proceed.  I discussed in depth with the patient the nature of  atherosclerosis, and emphasized the importance of maximal medical management including strict control of blood pressure, blood glucose, and lipid levels, antiplatelet agents, obtaining regular exercise, and cessation of smoking.    The patient is aware that without maximal medical management the underlying atherosclerotic disease process will progress, limiting the benefit of any interventions. The patient is currently on a statin: Lipitor.  The patient is currently on an anti-platelet: ASA.  Thank you for allowing [place] to participate in this patient's care.   Adele Barthel, MD, FACS [facility] Office: [phone] Pager: [phone]

## 2017-03-02 NOTE — H&P (View-Only) (Signed)
  Established Critical Limb Ischemia Patient   History of Present Illness   Leonard [name] is a 66 y.o. (01/21/1951) male who presents with chief complaint: L foot gangrene.    The patient has previously undergone: 1.  OA+PTA R SFA (01/13/13) 2.  Open amp R 4th and 5th toe by Dr. Early (01/28/13)  Reportedly the patient was being followed in his cardiology office.  In May/June some darkening of the left foot was noted but was felt to be more gout symptoms.  Over the last 2 weeks, family notes marked change in the left foot.  Initially he had gangrenous changes to the L 3rd toe.  This has progressed and now he has drainage from the foot.  The patient has no rest pain.  The patient's treatment regimen currently included: maximal medical management and wound care with Dr. Duda.  Past Medical History:  Diagnosis Date  . Cancer (HCC)    skin cancer-  . CHF (congestive heart failure) (HCC)   . Chronic kidney disease    not on dialysis  . Coronary artery disease   . Gangrene of toe (HCC)    right 5th/notes 01/04/2013   . High cholesterol   . Hypertension   . Iron deficiency anemia 08/03/2015  . 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 PAD (peripheral artery disease) (HCC)   . Shortness of breath dyspnea    with exertion  . Type II diabetes mellitus (HCC)    Type II    Past Surgical History:  Procedure Laterality Date  . AMPUTATION Right 01/28/2013   Procedure: RAY AMPUTATION RIGHT  4th & 5th TOE;  Surgeon: Todd F Early, MD;  [facility];  Service: Vascular;  Laterality: Right;  . ANGIOPLASTY / STENTING FEMORAL Right 01/13/2013   superficial femoral artery x 2 (3 mm x 60 mm, 4 mm x 60 mm)  . AV FISTULA PLACEMENT Left 02/11/2016   Procedure: LEFT UPPER ARM BRACHIAL CEPHALIC ARTERIOVENOUS (AV) FISTULA CREATION;  Surgeon: Demaya Hardge L Aibhlinn Kalmar, MD;  Location: [facility];  Service: Vascular;  Laterality: Left;  . COLONOSCOPY W/ POLYPECTOMY    . CORONARY ARTERY BYPASS  GRAFT N/A 04/07/2013   Procedure: CORONARY ARTERY BYPASS GRAFTING (CABG);  Surgeon: Peter Van Trigt, MD;  Location: [facility];  Service: Open Heart Surgery;  Laterality: N/A;  . INTRAOPERATIVE TRANSESOPHAGEAL ECHOCARDIOGRAM N/A 04/07/2013   Procedure: INTRAOPERATIVE TRANSESOPHAGEAL ECHOCARDIOGRAM;  Surgeon: Peter Van Trigt, MD;  Location[facility];  Service: Open Heart Surgery;  Laterality: N/A;  . LEFT HEART CATHETERIZATION WITH CORONARY ANGIOGRAM N/A 04/05/2013   Procedure: LEFT HEART CATHETERIZATION WITH CORONARY ANGIOGRAM;  Surgeon: Peter M Jordan, MD;  [facility]: [facility];  Service: Cardiovascular;  Laterality: N/A;  . LOWER EXTREMITY ANGIOGRAM Bilateral 01/06/2013   Procedure: LOWER EXTREMITY ANGIOGRAM;  Surgeon: Baylee Mccorkel L Taino Maertens, MD;  [facility];  Service: Cardiovascular;  Laterality: Bilateral;  . LOWER EXTREMITY ANGIOGRAM Right 01/13/2013   Procedure: LOWER EXTREMITY ANGIOGRAM;  Surgeon: Yoshito Gaza L Kellon Chalk, MD;  [facility];  Service: Cardiovascular;  Laterality: Right;  . SKIN CANCER EXCISION  2017  . THROAT SURGERY  1983   polyps  . TONSILLECTOMY AND ADENOIDECTOMY  ~ 1965    Social History   Socioeconomic History  . Marital status: Single    Spouse name: Not on file  . Number of children: Not on file  . Years of education: Not on file  . Highest education level: Not on file    Social Needs  . Financial resource strain: Not on file  . Food insecurity - worry: Not on file  . Food insecurity - inability: Not on file  . Transportation needs - medical: Not on file  . Transportation needs - non-medical: Not on file  Occupational History  . Not on file  Tobacco Use  . Smoking status: Former Smoker    Packs/day: 1.00    Years: 46.00    Pack years: 46.00    Types: Cigarettes    Start date: 02/04/1967    Last attempt to quit: 02/03/2013    Years since quitting: 4.0  . Smokeless tobacco: Never Used  . Tobacco comment: Doing well with quitting.  Substance and Sexual Activity  .  Alcohol use: No    Alcohol/week: 0.0 oz  . Drug use: No  . Sexual activity: Not Currently  Other Topics Concern  . Not on file  Social History Narrative   Lives with sister, Betty in [place].  Retired - former [employer] employee.    Family History  Problem Relation Age of Onset  . Cancer Mother        lung cancer  . Hypertension Mother   . Cancer Sister        patient thinks it was uterine cancer  . Hypertension Sister   . Heart attack Sister   . Stroke Neg Hx     Current Outpatient Medications  Medication Sig Dispense Refill  . allopurinol (ZYLOPRIM) 100 MG tablet Take 100 mg by mouth daily.    . amLODipine (NORVASC) 10 MG tablet Take 1 tablet (10 mg total) by mouth daily. 30 tablet 6  . aspirin 81 MG chewable tablet Chew 81 mg by mouth daily.    . atorvastatin (LIPITOR) 40 MG tablet TAKE ONE (1) TABLET BY MOUTH EVERY DAY AT 6:00PM (Patient taking differently: Take 40 mg by mouth once a day at 6 PM) 90 tablet 3  . Blood Glucose Monitoring Suppl (ONETOUCH VERIO FLEX SYSTEM) w/Device KIT 1 kit by Does not apply route daily. 1 kit 3  . carvedilol (COREG) 25 MG tablet TAKE TWO (2) TABLETS BY MOUTH 2 TIMES DAILY WITH A MEAL (Patient taking differently: TAKE TWO TABLETS (50mg) BY MOUTH 2 TIMES DAILY WITH A MEAL) 120 tablet 3  . cloNIDine (CATAPRES) 0.2 MG tablet TAKE TWO (2) TABLETS BY MOUTH 2 TIMES DAILY (Patient taking differently: Take 0.4 mg by mouth two times a day) 120 tablet 3  . Darbepoetin Alfa (ARANESP) 500 MCG/ML SOSY injection Inject 500 mcg into the skin every 14 (fourteen) days.     . doxazosin (CARDURA) 8 MG tablet Take 1 tablet (8 mg total) by mouth every evening.    . FEROSUL 325 (65 Fe) MG tablet TAKE ONE (1) TABLET BY MOUTH TWO (2) TIMES DAILY WITH A MEAL 60 tablet 3  . ferumoxytol 510 mg in sodium chloride 0.9 % 100 mL Inject into the vein every 7 (seven) days.     . glucose blood (ONETOUCH VERIO) test strip Use as instructed to test three times daily. ICD-10  code: E11.22 100 each 12  . hydrALAZINE (APRESOLINE) 100 MG tablet TAKE ONE TABLET BY MOUTH EVERY EIGHT HOURS (Patient taking differently: Take 100 mg by mouth every 8 hours) 90 tablet 1  . insulin aspart (NOVOLOG FLEXPEN) 100 UNIT/ML FlexPen Inject 15 Units 2 (two) times daily before lunch and supper into the skin. 15 mL 5  . Insulin Glargine (LANTUS SOLOSTAR) 100 UNIT/ML Solostar Pen Inject   50 Units into the skin every morning. (Patient taking differently: Inject 50 Units into the skin daily after breakfast. ) 15 mL 3  . isosorbide mononitrate (IMDUR) 60 MG 24 hr tablet TAKE ONE (1) TABLET BY MOUTH EVERY DAY (Patient taking differently: TAKE ONE TABLET (60mg) BY MOUTH EVERY DAY) 30 tablet 3  . levothyroxine (SYNTHROID, LEVOTHROID) 75 MCG tablet Take 1 tablet (75 mcg total) by mouth daily. 90 tablet 1  . nitroGLYCERIN (NITROSTAT) 0.4 MG SL tablet Place 1 tablet (0.4 mg total) under the tongue every 5 (five) minutes as needed for chest pain. 30 tablet 12  . ONETOUCH DELICA LANCETS 33G MISC 1 each by Does not apply route 3 (three) times daily. ICD-10 code: E11.22 100 each 12  . pantoprazole (PROTONIX) 40 MG tablet TAKE ONE (1) TABLET BY MOUTH EVERY DAY (Patient taking differently: TAKE ONE TABLET (40mg) BY MOUTH EVERY DAY) 90 tablet 0  . potassium chloride SA (K-DUR,KLOR-CON) 20 MEQ tablet Take 20 mEq by mouth 2 (two) times daily.     . torsemide (DEMADEX) 20 MG tablet Take 3 tablets (60 mg total) by mouth daily. May take an extra 20 mg Daily for weight gain above 212 lbs (Patient taking differently: Take 20 mg by mouth 3 (three) times daily. And may take an extra 20 mg daily for weight gain above 212 lbs) 90 tablet 6  . traMADol (ULTRAM) 50 MG tablet Take 1 tablet (50 mg total) by mouth every 6 (six) hours as needed for moderate pain. 60 tablet 0  . warfarin (COUMADIN) 5 MG tablet Take 2.5 mg (1/2 tablet) daily except 5 mg (1 tablet) on Monday and Friday 18 tablet 1  . enoxaparin (LOVENOX) 100 MG/ML  injection Inject 0.9 mLs (90 mg total) into the skin daily. Please inject ONLY 0.9 mLs (90mg total) on 12/04/16. Do NOT use whole syringe 0.9 mL 0   No current facility-administered medications for this visit.    Facility-Administered Medications Ordered in Other Visits  Medication Dose Route Frequency Provider Last Rate Last Dose  . sodium chloride flush (NS) 0.9 % injection 10 mL  10 mL Intracatheter PRN Gorsuch, Ni, MD         No Known Allergies  REVIEW OF SYSTEMS (negative unless checked):   Cardiac:  [] Chest pain or chest pressure? [] Shortness of breath upon activity? [] Shortness of breath when lying flat? [] Irregular heart rhythm?  Vascular:  [x] Pain in calf, thigh, or hip brought on by walking? [] Pain in feet at night that wakes you up from your sleep? [] Blood clot in your veins? [x] Leg swelling?  Pulmonary:  [] Oxygen at home? [] Productive cough? [] Wheezing?  Neurologic:  [] Sudden weakness in arms or legs? [] Sudden numbness in arms or legs? [] Sudden onset of difficult speaking or slurred speech? [] Temporary loss of vision in one eye? [] Problems with dizziness?  Gastrointestinal:  [] Blood in stool? [] Vomited blood?  Genitourinary:  [] Burning when urinating? [] Blood in urine?  Psychiatric:  [] Major depression  Hematologic:  [] Bleeding problems? [] Problems with blood clotting?  Dermatologic:  [] Rashes or ulcers?  Constitutional:  [] Fever or chills?  Ear/Nose/Throat:  [] Change in hearing? [] Nose bleeds? [] Sore throat?  Musculoskeletal:  [] Back pain? [] Joint pain? [] Muscle pain?   Physical Examination   Vitals:   03/06/17 0945  BP: (!) 154/67  Pulse: 78  Resp: 20    Temp: 98.4 F (36.9 C)  TempSrc: Oral  SpO2: 100%  Weight: 182 lb (82.6 kg)  Height: 5' 8" (1.727 m)   Body mass index is 27.67 kg/m.  General Alert, O x 3, WD, Ill appearing  Pulmonary Sym exp, good B air movt, CTA B  Cardiac RRR, Nl S1,  S2, no Murmurs, No rubs, No S3,S4  Vascular Vessel Right Left  Radial Palpable Faintly palpable  Brachial Palpable Palpable  Carotid Palpable, No Bruit Palpable, No Bruit  Aorta Not palpable N/A  Femoral Palpable Palpable  Popliteal Not palpable Not palpable  PT Palpable Palpable  DP Palpable Palpable    Gastro- intestinal soft, non-distended, non-tender to palpation, No guarding or rebound, no HSM, no masses, no CVAT B, No palpable prominent aortic pulse,    Musculo- skeletal M/S 5/5 throughout  , R foot with healed R 4th-5th amputations, L foot swollen, no TTP, L 2-4th toe with dry gangrene, L 5th toe ischemic with drainage between L 4/5th toes, L BC AVF w/ thrill and bruit  Neurologic Pain and light touch intact in extremities decreased in L foot, Motor exam as listed above    Non-Invasive Vascular Imaging   ABI (03/04/17)  R:   ABI: [place],   PT: mono  DP: mono  TBI:  0.33  L:   ABI: 0.48,   PT: mono  DP: mono  TBI: ND   Medical Decision Making   Leonard [name] is a 66 y.o. male who presents with: BLE critical limb ischemia with L foot gangrene   Based on the patient's vascular studies and examination, I have offered the patient: Aortogram with carbon dioxide, Left leg runoff.  This is scheduled for 7 FEB 19 I discussed with the patient the nature of angiographic procedures, especially the limited patencies of any endovascular intervention.   The patient is aware of that the risks of an angiographic procedure include but are not limited to: bleeding, infection, access site complications, renal failure, embolization, rupture of vessel, dissection, arteriovenous fistula, possible need for emergent surgical intervention, possible need for surgical procedures to treat the patient's pathology, anaphylactic reaction to contrast, and stroke and death.   The patient is aware of the risks and agrees to proceed.  I discussed in depth with the patient the nature of  atherosclerosis, and emphasized the importance of maximal medical management including strict control of blood pressure, blood glucose, and lipid levels, antiplatelet agents, obtaining regular exercise, and cessation of smoking.    The patient is aware that without maximal medical management the underlying atherosclerotic disease process will progress, limiting the benefit of any interventions. The patient is currently on a statin: Lipitor.  The patient is currently on an anti-platelet: ASA.  Thank you for allowing us to participate in this patient's care.   Carlinda Ohlson, MD, FACS [facility] Office: [phone] Pager: [phone]  

## 2017-03-03 ENCOUNTER — Other Ambulatory Visit (HOSPITAL_COMMUNITY): Payer: Self-pay

## 2017-03-03 ENCOUNTER — Ambulatory Visit (INDEPENDENT_AMBULATORY_CARE_PROVIDER_SITE_OTHER): Payer: Medicare Other | Admitting: *Deleted

## 2017-03-03 ENCOUNTER — Other Ambulatory Visit: Payer: Self-pay | Admitting: Family Medicine

## 2017-03-03 ENCOUNTER — Encounter (HOSPITAL_COMMUNITY): Payer: Self-pay

## 2017-03-03 DIAGNOSIS — Z7901 Long term (current) use of anticoagulants: Secondary | ICD-10-CM

## 2017-03-03 DIAGNOSIS — I82512 Chronic embolism and thrombosis of left femoral vein: Secondary | ICD-10-CM | POA: Diagnosis not present

## 2017-03-03 LAB — POCT INR: INR: 2.3

## 2017-03-03 NOTE — Patient Instructions (Signed)
Description   Continue taking 2.5 mg (1/2 tablet) daily except 5 mg(1 tablet) on Fridays. Recheck in 2 weeks. Call if placed on any new medications or if scheduled for any procedures or surgery [phone].

## 2017-03-03 NOTE — Progress Notes (Signed)
Paramedicine Encounter    Patient ID: Leonard [name], male    DOB: 1950/11/17, 67 y.o.   MRN: [id number]    Patient Care Team: [name], MD as PCP - General (Family Medicine) [name], MD as Consulting Physician (Nephrology)  Patient Active Problem List   Diagnosis Date Noted  . Atherosclerotic peripheral vascular disease with gangrene ([place]) 02/14/2017  . Encounter for vitamin deficiency screening 02/11/2017  . Collapse of left talus 01/14/2017  . Diabetic polyneuropathy associated with type 2 diabetes mellitus ([place]) 01/14/2017  . Type 2 diabetes mellitus with pressure callus ([place]) 12/29/2016  . Idiopathic chronic gout of left foot without tophus   . Left foot pain 10/29/2016  . Long term (current) use of anticoagulants [Z79.01] 09/09/2016  . Chronic deep vein thrombosis (DVT) of femoral vein of left lower extremity ([place]) 09/03/2016  . Morbid obesity due to excess calories ([place]) 03/17/2016  . Anemia due to stage 4 chronic kidney disease treated with darbepoetin ([place]) 02/28/2016  . Fatigue associated with anemia 01/21/2016  . Diabetic retinopathy ([place]) 11/13/2015  . CHF (congestive heart failure) ([place]) 08/27/2015  . Iron deficiency anemia 08/03/2015  . Claudication ([place])   . Diabetes ([place])   . Hematuria 06/27/2015  . Skin lesion of face 02/22/2015  . 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 Deficiency anemia 01/15/2015  . CKD (chronic kidney disease) stage 4, GFR 15-29 ml/min (HCC) 01/15/2015  . Hypothyroidism 11/22/2014  . COPD GOLD III  02/16/2014  . History of tobacco use 08/23/2013  . 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 CAD (coronary artery disease) 04/06/2013  . PVD - hx of Rt SFA PTA and s/p Rt 4-5th toe amp Dec 2014 04/05/2013  . Essential hypertension 11/25/2012  . Mixed hyperlipidemia 07/17/2008    Current Outpatient Medications:  .  allopurinol (ZYLOPRIM) 100 MG tablet, TAKE ONE TABLET TWICE DAILY (Patient taking  differently: TAKE ONE TABLET (139m) TWICE DAILY), Disp: 60 tablet, Rfl: 0 .  amLODipine (NORVASC) 10 MG tablet, Take 1 tablet (10 mg total) by mouth daily., Disp: 30 tablet, Rfl: 6 .  atorvastatin (LIPITOR) 40 MG tablet, TAKE ONE (1) TABLET BY MOUTH EVERY DAY AT 6:00PM (Patient taking differently: Take 40 mg by mouth once a day at 6 PM), Disp: 90 tablet, Rfl: 3 .  carvedilol (COREG) 25 MG tablet, TAKE TWO (2) TABLETS BY MOUTH 2 TIMES DAILY WITH A MEAL (Patient taking differently: TAKE TWO TABLETS (553m BY MOUTH 2 TIMES DAILY WITH A MEAL), Disp: 120 tablet, Rfl: 3 .  cloNIDine (CATAPRES) 0.2 MG tablet, TAKE TWO (2) TABLETS BY MOUTH 2 TIMES DAILY (Patient taking differently: Take 0.4 mg by mouth two times a day), Disp: 120 tablet, Rfl: 3 .  doxazosin (CARDURA) 8 MG tablet, Take 1 tablet (8 mg total) by mouth every evening., Disp: , Rfl:  .  ferrous sulfate 325 (65 FE) MG tablet, TAKE ONE (1) TABLET BY MOUTH TWO (2) TIMES DAILY WITH A MEAL (Patient taking differently: Take 325 mg by mouth two times a day with a meal), Disp: 60 tablet, Rfl: 3 .  hydrALAZINE (APRESOLINE) 100 MG tablet, TAKE ONE TABLET BY MOUTH EVERY EIGHT HOURS (Patient taking differently: Take 100 mg by mouth every 8 hours), Disp: 90 tablet, Rfl: 1 .  insulin aspart (NOVOLOG FLEXPEN) 100 UNIT/ML FlexPen, Inject 15 Units 2 (two) times daily before lunch and supper into the skin., Disp: 15 mL, Rfl: 5 .  Insulin Glargine (LANTUS SOLOSTAR) 100 UNIT/ML Solostar  Pen, Inject 50 Units into the skin every morning. (Patient taking differently: Inject 50 Units into the skin daily after breakfast. ), Disp: 15 mL, Rfl: 3 .  isosorbide mononitrate (IMDUR) 60 MG 24 hr tablet, TAKE ONE (1) TABLET BY MOUTH EVERY DAY (Patient taking differently: TAKE ONE TABLET (75m) BY MOUTH EVERY DAY), Disp: 30 tablet, Rfl: 3 .  levothyroxine (SYNTHROID, LEVOTHROID) 75 MCG tablet, Take 1 tablet (75 mcg total) by mouth daily., Disp: 90 tablet, Rfl: 1 .  pantoprazole  (PROTONIX) 40 MG tablet, TAKE ONE (1) TABLET BY MOUTH EVERY DAY (Patient taking differently: TAKE ONE TABLET (458m BY MOUTH EVERY DAY), Disp: 90 tablet, Rfl: 0 .  potassium chloride SA (K-DUR,KLOR-CON) 20 MEQ tablet, Take 20 mEq by mouth 2 (two) times daily. , Disp: , Rfl:  .  torsemide (DEMADEX) 20 MG tablet, Take 3 tablets (60 mg total) by mouth daily. May take an extra 20 mg Daily for weight gain above 212 lbs (Patient taking differently: Take 20 mg by mouth 3 (three) times daily. And may take an extra 20 mg daily for weight gain above 212 lbs), Disp: 90 tablet, Rfl: 6 .  warfarin (COUMADIN) 5 MG tablet, Take 2.5 mg (1/2 tablet) daily except 5 mg (1 tablet) on Monday and Friday, Disp: 18 tablet, Rfl: 1 .  aspirin 81 MG chewable tablet, Chew 81 mg by mouth daily., Disp: , Rfl:  .  Blood Glucose Monitoring Suppl (ONETOUCH VERIO FLEX SYSTEM) w/Device KIT, 1 kit by Does not apply route daily., Disp: 1 kit, Rfl: 3 .  Colchicine 0.6 MG CAPS, TAKE ONE (1) CAPSULE BY MOUTH 2 TIMES DAILY, Disp: 30 capsule, Rfl: 0 .  Darbepoetin Alfa (ARANESP) 500 MCG/ML SOSY injection, Inject 500 mcg into the skin every 14 (fourteen) days. , Disp: , Rfl:  .  enoxaparin (LOVENOX) 100 MG/ML injection, Inject 0.9 mLs (90 mg total) into the skin daily. Please inject ONLY 0.9 mLs (9093motal) on 12/04/16. Do NOT use whole syringe, Disp: 0.9 mL, Rfl: 0 .  ferumoxytol 510 mg in sodium chloride 0.9 % 100 mL, Inject into the vein every 7 (seven) days. , Disp: , Rfl:  .  glucose blood (ONETOUCH VERIO) test strip, Use as instructed to test three times daily. ICD-10 code: E11.22, Disp: 100 each, Rfl: 12 .  nitroGLYCERIN (NITROSTAT) 0.4 MG SL tablet, Place 1 tablet (0.4 mg total) under the tongue every 5 (five) minutes as needed for chest pain., Disp: 30 tablet, Rfl: 12 .  ONETOUCH DELICA LANCETS 33G17OSC, 1 each by Does not apply route 3 (three) times daily. ICD-10 code: E11.22, Disp: 100 each, Rfl: 12 .  traMADol (ULTRAM) 50 MG tablet,  Take 1 tablet (50 mg total) by mouth every 6 (six) hours as needed for moderate pain., Disp: 60 tablet, Rfl: 0 No current facility-administered medications for this visit.   Facility-Administered Medications Ordered in Other Visits:  .  sodium chloride flush (NS) 0.9 % injection 10 mL, 10 mL, Intracatheter, PRN, GorAlvy Bimleri, MD No Known Allergies   Social History   Socioeconomic History  . Marital status: Single    Spouse name: Not on file  . Number of children: Not on file  . Years of education: Not on file  . Highest education level: Not on file  Social Needs  . Financial resource strain: Not on file  . Food insecurity - worry: Not on file  . Food insecurity - inability: Not on file  . Transportation needs - medical:  Not on file  . Transportation needs - non-medical: Not on file  Occupational History  . Not on file  Tobacco Use  . Smoking status: Former Smoker    Packs/day: 1.00    Years: 46.00    Pack years: 46.00    Types: Cigarettes    Start date: 02/04/1967    Last attempt to quit: 02/03/2013    Years since quitting: 4.0  . Smokeless tobacco: Never Used  . Tobacco comment: Doing well with quitting.  Substance and Sexual Activity  . Alcohol use: No    Alcohol/week: 0.0 oz  . Drug use: No  . Sexual activity: Not Currently  Other Topics Concern  . Not on file  Social History Narrative   Lives with sister, [name] in [place].  Retired - former Sports coach.    Physical Exam  Pulmonary/Chest: No respiratory distress.  Abdominal: He exhibits no distension.  Musculoskeletal: He exhibits edema.  Pain and swelling right foot and ankles  Skin: Skin is warm and dry. He is not diaphoretic.        Future Appointments  Date Time Provider [place]  03/04/2017  3:30 PM [facility] VASC 1 [facility]  03/06/2017  9:30 AM Conrad [place], MD [facility]  03/13/2017 11:30 AM [facility] ONLY [facility] None  03/13/2017 12:00 PM [facility] B6 [facility] None   03/13/2017  3:30 PM [facility] INJ NURSE [facility] None  03/16/2017  1:45 PM Newt Minion, MD [facility] None  03/17/2017  1:30 PM [facility]  03/26/2017 12:00 PM [facility] LAB 6 [facility] None  03/26/2017 12:30 PM Alvy Bimler, Ni, MD [facility] None  03/26/2017  1:30 PM [facility] A3 [facility] None  03/27/2017  4:00 PM [facility] INJ NURSE [facility] None  04/10/2017 11:30 AM [facility] ONLY [facility] None  04/10/2017 12:00 PM [facility] A1 [facility] None  04/10/2017  3:30 PM [facility] INJ NURSE [facility] None  04/24/2017 11:30 AM [facility] LAB 5 [facility] None  04/24/2017 12:00 PM [facility] A3 [facility] None  04/24/2017  4:00 PM [facility] J32 DNS [facility] None  05/06/2017  2:45 PM Gardiner Barefoot, DPM [facility]    ATF pt CAO x4 sitting in recliner sleeping.  Pt just came in from getting lab work done.  He stated that he has a couple of appoinitments coming up for his right foot.  He stated that they are discussing amputating 3 of his toes.  Pt has taken all of his meds besides Friday morning and all of the afternoon hydralazine.  Pt was eating a sausage, egg and cheese biscuit from [employer] while I was refilling his pill box.  Although pt and I has had several conversations about his diet and the foods he eats, pt continues to eat them.  On the table was potted meat and several cans of vienna sauages.  He stated that it wasn't he's and he was cleaning out the cabinets.  Pt stated that he has taken all of his meds today including the insulin.  Pt vitals noted.  rx bottles verified and pill box refilled.    **warafin placed in the box according to today's INR check   **rx called in: Allopurinol Ferrous sulfate Out of refills for both of them and pharmacist will contact physician  BP 130/80 (BP Location: Right Arm, Patient Position: Sitting, Cuff Size: Normal)   Pulse 68   Resp 16   Wt 182 lb (82.6 kg)   SpO2 98%   BMI  27.67 kg/m  cbg 183  Weight yesterday-cant remember Last visit weight-188    Audi Conover, EMT Paramedic 03/03/2017    ACTION: Home visit completed

## 2017-03-04 ENCOUNTER — Ambulatory Visit (HOSPITAL_COMMUNITY)
Admission: RE | Admit: 2017-03-04 | Discharge: 2017-03-04 | Disposition: A | Discharge status: Home / Self Care | Payer: Medicare Other | Source: Ambulatory Visit | Attending: Vascular Surgery | Admitting: Vascular Surgery

## 2017-03-04 DIAGNOSIS — Z87891 Personal history of nicotine dependence: Secondary | ICD-10-CM | POA: Insufficient documentation

## 2017-03-04 DIAGNOSIS — I779 Disorder of arteries and arterioles, unspecified: Secondary | ICD-10-CM | POA: Insufficient documentation

## 2017-03-04 DIAGNOSIS — Z9862 Peripheral vascular angioplasty status: Secondary | ICD-10-CM | POA: Diagnosis not present

## 2017-03-06 ENCOUNTER — Encounter: Payer: Self-pay | Admitting: *Deleted

## 2017-03-06 ENCOUNTER — Ambulatory Visit (INDEPENDENT_AMBULATORY_CARE_PROVIDER_SITE_OTHER): Payer: Medicare Other | Admitting: Vascular Surgery

## 2017-03-06 ENCOUNTER — Telehealth: Payer: Self-pay | Admitting: Pharmacist

## 2017-03-06 ENCOUNTER — Encounter: Payer: Self-pay | Admitting: Vascular Surgery

## 2017-03-06 ENCOUNTER — Ambulatory Visit (HOSPITAL_COMMUNITY)
Admission: RE | Admit: 2017-03-06 | Discharge: 2017-03-06 | Disposition: A | Discharge status: Home / Self Care | Payer: Medicaid Other | Source: Ambulatory Visit | Attending: Hematology and Oncology | Admitting: Hematology and Oncology

## 2017-03-06 ENCOUNTER — Other Ambulatory Visit: Payer: Self-pay | Admitting: *Deleted

## 2017-03-06 ENCOUNTER — Telehealth: Payer: Self-pay

## 2017-03-06 ENCOUNTER — Ambulatory Visit (INDEPENDENT_AMBULATORY_CARE_PROVIDER_SITE_OTHER): Payer: Medicare Other | Admitting: *Deleted

## 2017-03-06 VITALS — BP 154/67 | HR 78 | Temp 98.4°F | Resp 20 | Ht 68.0 in | Wt 182.0 lb

## 2017-03-06 DIAGNOSIS — I779 Disorder of arteries and arterioles, unspecified: Secondary | ICD-10-CM

## 2017-03-06 DIAGNOSIS — I82512 Chronic embolism and thrombosis of left femoral vein: Secondary | ICD-10-CM | POA: Diagnosis not present

## 2017-03-06 DIAGNOSIS — Z5181 Encounter for therapeutic drug level monitoring: Secondary | ICD-10-CM | POA: Diagnosis not present

## 2017-03-06 DIAGNOSIS — Z7901 Long term (current) use of anticoagulants: Secondary | ICD-10-CM

## 2017-03-06 DIAGNOSIS — I70269 Atherosclerosis of native arteries of extremities with gangrene, unspecified extremity: Secondary | ICD-10-CM

## 2017-03-06 DIAGNOSIS — Z951 Presence of aortocoronary bypass graft: Secondary | ICD-10-CM

## 2017-03-06 LAB — POCT INR: INR: 3.4

## 2017-03-06 MED ORDER — ENOXAPARIN SODIUM 80 MG/0.8ML ~~LOC~~ SOLN: 80.0000 mg | Freq: Two times a day (BID) | SUBCUTANEOUS | 0 refills | Status: DC

## 2017-03-06 NOTE — Progress Notes (Signed)
Went over pre-procedure in GREAT detail with family. Made a chart for medications that patient should take am of procedure and A chart for Insulin coverage for the pm of 03/11/17 and am of 03/12/17 per [facility] Protocol. NPO past 8am of 03/12/17. Instructed to hold Coumadin and scheduled appointment at Coumadin clinic for today at 1 pm where they will instruct family re:Lovenox. (spoke to [employer]).

## 2017-03-06 NOTE — Telephone Encounter (Signed)
Leonard [name] left message on nurse line asking for call back from Dr [employer] assistant. Stated they were to call and ask for the assistant if they had any problems. Routed to PCP and white team. Danley Danker, RN [facility] RN)

## 2017-03-06 NOTE — Patient Instructions (Signed)
03/06/2017 no coumadin as INR 3.4   03/07/2017: No Coumadin or Lovenox.  03/08/2017 : Inject Lovenox 80mg  in the fatty abdominal tissue at least 2 inches from the belly button twice a day about 12 hours apart, 8am and 8pm rotate sites. No Coumadin.  03/09/2017: Inject Lovenox in the fatty tissue every 12 hours, 8am and 8pm. No Coumadin.  03/10/2017: Inject Lovenox in the fatty tissue every 12 hours, 8am and 8pm. No Coumadin.  03/11/2017: Inject Lovenox in the fatty tissue in the morning at 8 am (No PM dose). No Coumadin.  03/12/2017: Procedure Day - No Lovenox - Resume Coumadin in the evening or as directed by doctor (take an extra half tablet with usual dose for 2 days then resume normal dose).  03/13/2017: Resume Lovenox inject in the fatty tissue every 12 hours and take Coumadin.  03/14/2017: Inject Lovenox in the fatty tissue every 12 hours and take Coumadin.  03/15/2017: Inject Lovenox in the fatty tissue every 12 hours and take Coumadin.  03/16/2017: Inject Lovenox in the fatty tissue every 12 hours and take Coumadin.  03/17/2017: Inject Lovenox in the fatty tissue every 12 hours and take Coumadin.  03/18/2017: Coumadin appt to check INR.

## 2017-03-06 NOTE — Telephone Encounter (Signed)
Received fax for clearance of warfarin for aortogram with possible intervention. Pt takes warfarin with history of DVT last DVT in 09/2016. Ok to hold warfarin for 5 days with lovenox bridge. Clearance faxed back to Dr. Lianne Moris office.

## 2017-03-06 NOTE — Telephone Encounter (Signed)
Patient's sister, Ms Vira Agar, stopped by office. Spoke with Ms Vira Agar who wanted to request stat skilled nursing visits be entered for wound care. Stated patient saw Dr Bridgett Larsson the vascular doctor today and his wound has gotten much worse and is draining. Wound was dressed at Dr Lianne Moris but needs help in tending to it. Also stated patient's orders for personal care services for ADL's have expired.  Spoke with preceptor, Dr Gwendlyn Deutscher, about possibility of entering [facility] orders. Preceptor suggested contacting Dr. Lianne Moris office to find out his plans for the dressing and then we can possibly proceed.  Spoke with Becky at Dr Lianne Moris office and he has already entered orders for [facility] for wound care. Relayed this to patient's sister and told her to keep dressing clean and dry until she hears from [facility]. If she does not hear from [facility] by Monday she will contact Jennings at Dr Lianne Moris office. She was appreciative. Danley Danker, RN [facility] RN)

## 2017-03-10 ENCOUNTER — Telehealth: Payer: Self-pay

## 2017-03-10 ENCOUNTER — Telehealth: Payer: Self-pay | Admitting: Family Medicine

## 2017-03-10 ENCOUNTER — Other Ambulatory Visit (HOSPITAL_COMMUNITY): Payer: Self-pay

## 2017-03-10 NOTE — Telephone Encounter (Signed)
Pt needs incontinence supplies and [employer] has been faxed a few times for requests for this. They are supposed to be faxing again just to be safe. Please advise

## 2017-03-10 NOTE — Telephone Encounter (Signed)
Representative from [employer] [phone], ext [phone]) left message on nurse line stating that an order form for incontinence supplies had been faxed on 02/12/17 and 02/17/17 and has not been received back. Unable to locate chart note regarding this or form in the faxes from January. Please advise.Danley Danker, RN [facility] RN)

## 2017-03-10 NOTE — Progress Notes (Signed)
Paramedicine Encounter    Patient ID: Leonard [name], male    DOB: Mar 28, 1950, 67 y.o.   MRN: [id number]    Patient Care Team: [name], MD as PCP - General (Family Medicine) [name], MD as Consulting Physician (Nephrology)  Patient Active Problem List   Diagnosis Date Noted  . Atherosclerotic peripheral vascular disease with gangrene ([place]) 02/14/2017  . Encounter for vitamin deficiency screening 02/11/2017  . Collapse of left talus 01/14/2017  . Diabetic polyneuropathy associated with type 2 diabetes mellitus ([place]) 01/14/2017  . 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 Idiopathic chronic gout of left foot without tophus   . Left foot pain 10/29/2016  . Long term (current) use of anticoagulants [Z79.01] 09/09/2016  . Chronic deep vein thrombosis (DVT) of femoral vein of left lower extremity ([place]) 09/03/2016  . Morbid obesity due to excess calories ([place]) 03/17/2016  . Anemia due to stage 4 chronic kidney disease treated with darbepoetin ([place]) 02/28/2016  . Fatigue associated with anemia 01/21/2016  . Diabetic retinopathy ([place]) 11/13/2015  . CHF (congestive heart failure) ([place]) 08/27/2015  . Iron deficiency anemia 08/03/2015  . Claudication ([place])   . Diabetes ([place])   . Hematuria 06/27/2015  . Skin lesion of face 02/22/2015  . 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 Deficiency anemia 01/15/2015  . CKD (chronic kidney disease) stage 4, GFR 15-29 ml/min (HCC) 01/15/2015  . Hypothyroidism 11/22/2014  . COPD GOLD III  02/16/2014  . History of tobacco use 08/23/2013  . 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 CAD (coronary artery disease) 04/06/2013  . PVD - hx of Rt SFA PTA and s/p Rt 4-5th toe amp Dec 2014 04/05/2013  . Essential hypertension 11/25/2012  . Mixed hyperlipidemia 07/17/2008    Current Outpatient Medications:  .  allopurinol (ZYLOPRIM) 100 MG tablet, Take 100 mg by mouth daily., Disp: , Rfl:  .   amLODipine (NORVASC) 10 MG tablet, Take 1 tablet (10 mg total) by mouth daily., Disp: 30 tablet, Rfl: 6 .  atorvastatin (LIPITOR) 40 MG tablet, TAKE ONE (1) TABLET BY MOUTH EVERY DAY AT 6:00PM (Patient taking differently: Take 40 mg by mouth once a day at 6 PM), Disp: 90 tablet, Rfl: 3 .  carvedilol (COREG) 25 MG tablet, TAKE TWO (2) TABLETS BY MOUTH 2 TIMES DAILY WITH A MEAL (Patient taking differently: TAKE TWO TABLETS (22m) BY MOUTH 2 TIMES DAILY WITH A MEAL), Disp: 120 tablet, Rfl: 3 .  cloNIDine (CATAPRES) 0.2 MG tablet, TAKE TWO (2) TABLETS BY MOUTH 2 TIMES DAILY (Patient taking differently: Take 0.4 mg by mouth two times a day), Disp: 120 tablet, Rfl: 3 .  doxazosin (CARDURA) 8 MG tablet, Take 1 tablet (8 mg total) by mouth every evening. (Patient taking differently: Take 8 mg by mouth at bedtime. ), Disp: , Rfl:  .  enoxaparin (LOVENOX) 80 MG/0.8ML injection, Inject 0.8 mLs (80 mg total) into the skin every 12 (twelve) hours., Disp: 20 Syringe, Rfl: 0 .  FEROSUL 325 (65 Fe) MG tablet, TAKE ONE (1) TABLET BY MOUTH TWO (2) TIMES DAILY WITH A MEAL (Patient taking differently: TAKE 325 MG BY MOUTH TWO (2) TIMES DAILY WITH A MEAL), Disp: 60 tablet, Rfl: 3 .  glucose blood (ONETOUCH VERIO) test strip, Use as instructed to test three times daily. ICD-10 code: E11.22, Disp: 100 each, Rfl: 12 .  hydrALAZINE (APRESOLINE) 100 MG tablet, TAKE ONE TABLET BY MOUTH EVERY EIGHT HOURS (Patient taking differently:  TAKE 1 TABLET (100 MG) BY MOUTH TWICE DAILY), Disp: 90 tablet, Rfl: 1 .  insulin aspart (NOVOLOG FLEXPEN) 100 UNIT/ML FlexPen, Inject 15 Units 2 (two) times daily before lunch and supper into the skin., Disp: 15 mL, Rfl: 5 .  Insulin Glargine (LANTUS SOLOSTAR) 100 UNIT/ML Solostar Pen, Inject 50 Units into the skin every morning. (Patient taking differently: Inject 50 Units into the skin daily after breakfast. ), Disp: 15 mL, Rfl: 3 .  isosorbide mononitrate (IMDUR) 60 MG 24 hr tablet, TAKE ONE (1)  TABLET BY MOUTH EVERY DAY (Patient taking differently: TAKE ONE TABLET (36m) BY MOUTH EVERY DAY), Disp: 30 tablet, Rfl: 3 .  levothyroxine (SYNTHROID, LEVOTHROID) 75 MCG tablet, Take 1 tablet (75 mcg total) by mouth daily., Disp: 90 tablet, Rfl: 1 .  pantoprazole (PROTONIX) 40 MG tablet, TAKE ONE (1) TABLET BY MOUTH EVERY DAY (Patient taking differently: TAKE ONE TABLET (435m BY MOUTH EVERY DAY), Disp: 90 tablet, Rfl: 0 .  potassium chloride SA (K-DUR,KLOR-CON) 20 MEQ tablet, Take 20 mEq by mouth 2 (two) times daily. , Disp: , Rfl:  .  torsemide (DEMADEX) 20 MG tablet, Take 3 tablets (60 mg total) by mouth daily. May take an extra 20 mg Daily for weight gain above 212 lbs (Patient taking differently: Take 60 mg by mouth daily. And may take an extra 20 mg daily for weight gain above 212 lbs), Disp: 90 tablet, Rfl: 6 .  warfarin (COUMADIN) 5 MG tablet, Take 2.5 mg (1/2 tablet) daily except 5 mg (1 tablet) on Monday and Friday (Patient taking differently: Take 2.5-5 mg by mouth See admin instructions. Take 5 mg by mouth daily on Friday. Take 2.5 mg by mouth daily on all other days), Disp: 18 tablet, Rfl: 1 .  Blood Glucose Monitoring Suppl (ONETOUCH VERIO FLEX SYSTEM) w/Device KIT, 1 kit by Does not apply route daily. (Patient not taking: Reported on 03/10/2017), Disp: 1 kit, Rfl: 3 .  nitroGLYCERIN (NITROSTAT) 0.4 MG SL tablet, Place 1 tablet (0.4 mg total) under the tongue every 5 (five) minutes as needed for chest pain., Disp: 30 tablet, Rfl: 12 .  ONETOUCH DELICA LANCETS 3335KISC, 1 each by Does not apply route 3 (three) times daily. ICD-10 code: E11.22 (Patient not taking: Reported on 03/10/2017), Disp: 100 each, Rfl: 12 .  traMADol (ULTRAM) 50 MG tablet, Take 1 tablet (50 mg total) by mouth every 6 (six) hours as needed for moderate pain., Disp: 60 tablet, Rfl: 0 No current facility-administered medications for this visit.   Facility-Administered Medications Ordered in Other Visits:  .  sodium chloride  flush (NS) 0.9 % injection 10 mL, 10 mL, Intracatheter, PRN, GoAlvy BimlerNi, MD No Known Allergies   Social History   Socioeconomic History  . Marital status: Single    Spouse name: Not on file  . Number of children: Not on file  . Years of education: Not on file  . Highest education level: Not on file  Social Needs  . Financial resource strain: Not on file  . Food insecurity - worry: Not on file  . Food insecurity - inability: Not on file  . Transportation needs - medical: Not on file  . Transportation needs - non-medical: Not on file  Occupational History  . Not on file  Tobacco Use  . Smoking status: Former Smoker    Packs/day: 1.00    Years: 46.00    Pack years: 46.00    Types: Cigarettes    Start date: 02/04/1967  Last attempt to quit: 02/03/2013    Years since quitting: 4.0  . Smokeless tobacco: Never Used  . Tobacco comment: Doing well with quitting.  Substance and Sexual Activity  . Alcohol use: No    Alcohol/week: 0.0 oz  . Drug use: No  . Sexual activity: Not Currently  Other Topics Concern  . Not on file  Social History Narrative   Lives with sister, [name] in [place].  Retired - former Sports coach.    Physical Exam  Pulmonary/Chest: No respiratory distress. He has no wheezes.  Abdominal: He exhibits no distension. There is no tenderness.  Musculoskeletal: He exhibits edema.  Left foot still swollen and discolored  Skin: Skin is warm and dry. He is not diaphoretic.        Future Appointments  Date Time Provider [place]  03/13/2017 11:30 AM [facility] ONLY [facility] None  03/13/2017 12:00 PM [facility] B6 [facility] None  03/13/2017  3:30 PM [facility] INJ NURSE [facility] None  03/16/2017  1:45 PM Newt Minion, MD [facility] None  03/18/2017  8:45 AM [facility]  03/26/2017 12:00 PM [facility] LAB 6 [facility] None  03/26/2017 12:30 PM Gorsuch, Ni, MD [facility] None  03/26/2017  1:30 PM  [facility] A3 [facility] None  03/27/2017  4:00 PM [facility] INJ NURSE [facility] None  04/10/2017 11:30 AM [facility] ONLY [facility] None  04/10/2017 12:00 PM [facility] A1 [facility] None  04/10/2017  3:30 PM [facility] INJ NURSE [facility] None  04/24/2017 11:30 AM [facility] LAB 5 [facility] None  04/24/2017 12:00 PM [facility] A3 [facility] None  04/24/2017  4:00 PM [facility] J32 DNS [facility] None  05/06/2017  2:45 PM Gardiner Barefoot, DPM [facility]    ATF pt CAO x4 sitting in the living room talking with his sister.  Pts sister called and updated me on his current medication revision.  Pt has a procedure this week to remove 3 toes.  Pt is no longer taking warfarin until after the procedure.  Pt's niece is coming over to give the lovonox shots.  His sister also advised me that he hasn't been taking his insulin due to him being too weak.  It's unknown if he's weaker due to his current heart condition, pain in his foot or that he's taking tramadol for the pain.  Pt denies sob, chest pain and dizziness today.  Pt took his morning meds this morning meds prior to my arrival around 8am.  He took one hydralazine fr the afternoon during our visit.  He hasn't taken any afternoon hydralazine.  He also missed fri/sat morning meds.  His sister also advised me that she took the warfarin out of his pill box for the week.  rx bottles verified and pill box refilled.  Pt has not eaten breakfast or lunch for the day but he keeps requesting to eat some candy.  I advised him to hold off on the candy and eat food.  He hasn't taken his insulin dose for this am.  Spoke sam with [facility] pharmacy during this visit about pt current meds.  We were able to verify his meds  BP (!) 100/42 (BP Location: Right Arm, Patient Position: Sitting, Cuff Size: Normal)   Resp 16   SpO2 97%    CBG 82 (hasn't taken insulin yet)  **no warfarin placed in box until on 03/15/17 due to upcoming procedure Thursday.  Pt is  getting lovenox shots given by his niece according to the instructions given on 03/06/17. His  sister took the warfarin out of the box last week after the appointment.    **rx called in: atorvastin Levothyroxine  [facility] pharmacy is no longer delivering.  Pt request [facility] to take over his meds Pt can not stand on the scale to weigh due to the foot pain and swelling.    Saige Canton, EMT Paramedic 03/10/2017    ACTION: Home visit completed

## 2017-03-11 NOTE — Telephone Encounter (Signed)
Will look out for paperwork.

## 2017-03-11 NOTE — Telephone Encounter (Signed)
Am covering Dr. Thad Ranger inbox and have not seen the order forms either. Please ask agency to refax and that they have right fax number.

## 2017-03-12 ENCOUNTER — Other Ambulatory Visit: Payer: Self-pay

## 2017-03-12 ENCOUNTER — Encounter (HOSPITAL_COMMUNITY): Payer: Self-pay

## 2017-03-12 ENCOUNTER — Inpatient Hospital Stay (HOSPITAL_COMMUNITY)
Admission: RE | Admit: 2017-03-12 | Discharge: 2017-03-18 | DRG: 240 | Disposition: A | Discharge status: Skilled Nursing Facility | Payer: Medicare Other | Source: Ambulatory Visit | Attending: Vascular Surgery | Admitting: Vascular Surgery

## 2017-03-12 ENCOUNTER — Inpatient Hospital Stay (HOSPITAL_COMMUNITY)
Admission: RE | Disposition: A | Discharge status: Skilled Nursing Facility | Payer: Self-pay | Source: Ambulatory Visit | Attending: Vascular Surgery

## 2017-03-12 DIAGNOSIS — I96 Gangrene, not elsewhere classified: Secondary | ICD-10-CM | POA: Diagnosis present

## 2017-03-12 DIAGNOSIS — Z87891 Personal history of nicotine dependence: Secondary | ICD-10-CM

## 2017-03-12 DIAGNOSIS — I509 Heart failure, unspecified: Secondary | ICD-10-CM | POA: Diagnosis not present

## 2017-03-12 DIAGNOSIS — Z951 Presence of aortocoronary bypass graft: Secondary | ICD-10-CM | POA: Diagnosis not present

## 2017-03-12 DIAGNOSIS — Z7982 Long term (current) use of aspirin: Secondary | ICD-10-CM | POA: Diagnosis not present

## 2017-03-12 DIAGNOSIS — D509 Iron deficiency anemia, unspecified: Secondary | ICD-10-CM | POA: Diagnosis present

## 2017-03-12 DIAGNOSIS — E1122 Type 2 diabetes mellitus with diabetic chronic kidney disease: Secondary | ICD-10-CM | POA: Diagnosis not present

## 2017-03-12 DIAGNOSIS — I2510 Atherosclerotic heart disease of native coronary artery without angina pectoris: Secondary | ICD-10-CM | POA: Diagnosis not present

## 2017-03-12 DIAGNOSIS — Z7901 Long term (current) use of anticoagulants: Secondary | ICD-10-CM | POA: Diagnosis not present

## 2017-03-12 DIAGNOSIS — I130 Hypertensive heart and chronic kidney disease with heart failure and stage 1 through stage 4 chronic kidney disease, or unspecified chronic kidney disease: Secondary | ICD-10-CM | POA: Diagnosis not present

## 2017-03-12 DIAGNOSIS — E1152 Type 2 diabetes mellitus with diabetic peripheral angiopathy with gangrene: Secondary | ICD-10-CM | POA: Diagnosis not present

## 2017-03-12 DIAGNOSIS — N184 Chronic kidney disease, stage 4 (severe): Secondary | ICD-10-CM | POA: Diagnosis not present

## 2017-03-12 DIAGNOSIS — Z85828 Personal history of other malignant neoplasm of skin: Secondary | ICD-10-CM | POA: Diagnosis not present

## 2017-03-12 DIAGNOSIS — J449 Chronic obstructive pulmonary disease, unspecified: Secondary | ICD-10-CM | POA: Diagnosis present

## 2017-03-12 DIAGNOSIS — F0390 Unspecified dementia without behavioral disturbance: Secondary | ICD-10-CM | POA: Diagnosis present

## 2017-03-12 DIAGNOSIS — Z79899 Other long term (current) drug therapy: Secondary | ICD-10-CM | POA: Diagnosis not present

## 2017-03-12 DIAGNOSIS — Z419 Encounter for procedure for purposes other than remedying health state, unspecified: Secondary | ICD-10-CM

## 2017-03-12 DIAGNOSIS — Z794 Long term (current) use of insulin: Secondary | ICD-10-CM

## 2017-03-12 DIAGNOSIS — E7800 Pure hypercholesterolemia, unspecified: Secondary | ICD-10-CM | POA: Diagnosis not present

## 2017-03-12 DIAGNOSIS — I700 Atherosclerosis of aorta: Secondary | ICD-10-CM | POA: Diagnosis present

## 2017-03-12 DIAGNOSIS — M109 Gout, unspecified: Secondary | ICD-10-CM | POA: Diagnosis present

## 2017-03-12 DIAGNOSIS — I70269 Atherosclerosis of native arteries of extremities with gangrene, unspecified extremity: Secondary | ICD-10-CM | POA: Diagnosis present

## 2017-03-12 HISTORY — PX: ABDOMINAL AORTOGRAM W/LOWER EXTREMITY: CATH118223

## 2017-03-12 LAB — POCT I-STAT, CHEM 8
BUN: 65 mg/dL — ABNORMAL HIGH (ref 6–20)
Calcium, Ion: 1.15 mmol/L (ref 1.15–1.40)
Chloride: 98 mmol/L — ABNORMAL LOW (ref 101–111)
Creatinine, Ser: 3 mg/dL — ABNORMAL HIGH (ref 0.61–1.24)
Glucose, Bld: 225 mg/dL — ABNORMAL HIGH (ref 65–99)
HCT: 26 % — ABNORMAL LOW (ref 39.0–52.0)
Hemoglobin: 8.8 g/dL — ABNORMAL LOW (ref 13.0–17.0)
Potassium: 5 mmol/L (ref 3.5–5.1)
Sodium: 132 mmol/L — ABNORMAL LOW (ref 135–145)
TCO2: 22 mmol/L (ref 22–32)

## 2017-03-12 LAB — PROTIME-INR
INR: 2.29
Prothrombin Time: 25 seconds — ABNORMAL HIGH (ref 11.4–15.2)

## 2017-03-12 LAB — CBC
HCT: 24.4 % — ABNORMAL LOW (ref 39.0–52.0)
Hemoglobin: 7.3 g/dL — ABNORMAL LOW (ref 13.0–17.0)
MCH: 21.2 pg — AB (ref 26.0–34.0)
MCHC: 29.9 g/dL — ABNORMAL LOW (ref 30.0–36.0)
MCV: 70.7 fL — ABNORMAL LOW (ref 78.0–100.0)
PLATELETS: 334 10*3/uL (ref 150–400)
RBC: 3.45 MIL/uL — AB (ref 4.22–5.81)
RDW: 19.8 % — ABNORMAL HIGH (ref 11.5–15.5)
WBC: 11 10*3/uL — ABNORMAL HIGH (ref 4.0–10.5)

## 2017-03-12 LAB — GLUCOSE, CAPILLARY
Glucose-Capillary: 196 mg/dL — ABNORMAL HIGH (ref 65–99)
Glucose-Capillary: 232 mg/dL — ABNORMAL HIGH (ref 65–99)

## 2017-03-12 LAB — CREATININE, SERUM
CREATININE: 2.9 mg/dL — AB (ref 0.61–1.24)
GFR, EST AFRICAN AMERICAN: 25 mL/min — AB (ref >60)
GFR, EST NON AFRICAN AMERICAN: 21 mL/min — AB (ref >60)

## 2017-03-12 SURGERY — ABDOMINAL AORTOGRAM W/LOWER EXTREMITY
Anesthesia: LOCAL

## 2017-03-12 MED ORDER — IOPAMIDOL (ISOVUE-370) INJECTION 76%
INTRAVENOUS | Status: DC | PRN
  Administered 2017-03-12: 35 mL via INTRAVENOUS

## 2017-03-12 MED ORDER — ALLOPURINOL 100 MG PO TABS
100.0000 mg | ORAL_TABLET | Freq: Every day | ORAL | Status: DC
  Administered 2017-03-13 – 2017-03-18 (×5): 100 mg via ORAL
  Filled 2017-03-12 (×7): qty 1

## 2017-03-12 MED ORDER — LIDOCAINE HCL 1 % IJ SOLN
INTRAMUSCULAR | Status: AC
  Filled 2017-03-12: qty 20

## 2017-03-12 MED ORDER — INSULIN ASPART 100 UNIT/ML ~~LOC~~ SOLN
0.0000 [IU] | Freq: Three times a day (TID) | SUBCUTANEOUS | Status: DC
  Administered 2017-03-13 (×2): 3 [IU] via SUBCUTANEOUS
  Administered 2017-03-13: 5 [IU] via SUBCUTANEOUS
  Administered 2017-03-14: 2 [IU] via SUBCUTANEOUS
  Administered 2017-03-14 – 2017-03-15 (×3): 5 [IU] via SUBCUTANEOUS
  Administered 2017-03-15: 3 [IU] via SUBCUTANEOUS
  Administered 2017-03-15: 5 [IU] via SUBCUTANEOUS
  Administered 2017-03-16: 8 [IU] via SUBCUTANEOUS
  Administered 2017-03-16: 5 [IU] via SUBCUTANEOUS
  Administered 2017-03-16: 8 [IU] via SUBCUTANEOUS
  Administered 2017-03-17 – 2017-03-18 (×4): 5 [IU] via SUBCUTANEOUS
  Administered 2017-03-18: 3 [IU] via SUBCUTANEOUS

## 2017-03-12 MED ORDER — HEPARIN SODIUM (PORCINE) 5000 UNIT/ML IJ SOLN
5000.0000 [IU] | Freq: Three times a day (TID) | INTRAMUSCULAR | Status: DC
  Administered 2017-03-12 – 2017-03-18 (×16): 5000 [IU] via SUBCUTANEOUS
  Filled 2017-03-12 (×16): qty 1

## 2017-03-12 MED ORDER — CARVEDILOL 25 MG PO TABS
50.0000 mg | ORAL_TABLET | Freq: Two times a day (BID) | ORAL | Status: DC
  Administered 2017-03-13 – 2017-03-18 (×11): 50 mg via ORAL
  Filled 2017-03-12 (×12): qty 2

## 2017-03-12 MED ORDER — OXYCODONE HCL 5 MG PO TABS
5.0000 mg | ORAL_TABLET | ORAL | Status: DC | PRN
  Administered 2017-03-12 – 2017-03-16 (×9): 10 mg via ORAL
  Administered 2017-03-17: 5 mg via ORAL
  Administered 2017-03-17: 10 mg via ORAL
  Filled 2017-03-12 (×11): qty 2
  Filled 2017-03-12: qty 1

## 2017-03-12 MED ORDER — SODIUM CHLORIDE 0.9 % IV SOLN: INTRAVENOUS | Status: DC

## 2017-03-12 MED ORDER — MORPHINE SULFATE (PF) 2 MG/ML IV SOLN
2.0000 mg | INTRAVENOUS | Status: DC | PRN
  Administered 2017-03-12 – 2017-03-15 (×4): 2 mg via INTRAVENOUS
  Filled 2017-03-12 (×4): qty 1

## 2017-03-12 MED ORDER — SODIUM CHLORIDE 0.9% FLUSH
3.0000 mL | Freq: Two times a day (BID) | INTRAVENOUS | Status: DC
  Administered 2017-03-12 – 2017-03-14 (×3): 3 mL via INTRAVENOUS

## 2017-03-12 MED ORDER — AMLODIPINE BESYLATE 10 MG PO TABS
10.0000 mg | ORAL_TABLET | Freq: Every day | ORAL | Status: DC
  Administered 2017-03-13 – 2017-03-18 (×5): 10 mg via ORAL
  Filled 2017-03-12 (×6): qty 1

## 2017-03-12 MED ORDER — SODIUM CHLORIDE 0.9 % IV SOLN
250.0000 mL | INTRAVENOUS | Status: DC | PRN
  Administered 2017-03-15: 07:00:00 via INTRAVENOUS

## 2017-03-12 MED ORDER — HEPARIN (PORCINE) IN NACL 2-0.9 UNIT/ML-% IJ SOLN
INTRAMUSCULAR | Status: AC | PRN
  Administered 2017-03-12: 1000 mL

## 2017-03-12 MED ORDER — HYDRALAZINE HCL 20 MG/ML IJ SOLN
5.0000 mg | INTRAMUSCULAR | Status: DC | PRN
  Filled 2017-03-12: qty 1

## 2017-03-12 MED ORDER — CEFAZOLIN SODIUM-DEXTROSE 2-4 GM/100ML-% IV SOLN
INTRAVENOUS | Status: AC
  Filled 2017-03-12: qty 100

## 2017-03-12 MED ORDER — SODIUM CHLORIDE 0.9 % WEIGHT BASED INFUSION
1.0000 mL/kg/h | INTRAVENOUS | Status: AC
  Administered 2017-03-12: 1 mL/kg/h via INTRAVENOUS

## 2017-03-12 MED ORDER — LABETALOL HCL 5 MG/ML IV SOLN: 10.0000 mg | INTRAVENOUS | Status: DC | PRN

## 2017-03-12 MED ORDER — ONDANSETRON HCL 4 MG/2ML IJ SOLN
4.0000 mg | Freq: Four times a day (QID) | INTRAMUSCULAR | Status: DC | PRN
  Administered 2017-03-15 – 2017-03-16 (×2): 4 mg via INTRAVENOUS
  Filled 2017-03-12: qty 2

## 2017-03-12 MED ORDER — HEPARIN (PORCINE) IN NACL 2-0.9 UNIT/ML-% IJ SOLN
INTRAMUSCULAR | Status: AC
  Filled 2017-03-12: qty 1000

## 2017-03-12 MED ORDER — DOXAZOSIN MESYLATE 2 MG PO TABS
8.0000 mg | ORAL_TABLET | Freq: Every evening | ORAL | Status: DC
  Administered 2017-03-13 – 2017-03-17 (×5): 8 mg via ORAL
  Filled 2017-03-12 (×5): qty 4

## 2017-03-12 MED ORDER — HYDRALAZINE HCL 50 MG PO TABS
100.0000 mg | ORAL_TABLET | Freq: Two times a day (BID) | ORAL | Status: DC
  Administered 2017-03-12 – 2017-03-18 (×11): 100 mg via ORAL
  Filled 2017-03-12 (×11): qty 2

## 2017-03-12 MED ORDER — OXYCODONE HCL 5 MG PO TABS
ORAL_TABLET | ORAL | Status: AC
  Filled 2017-03-12: qty 2

## 2017-03-12 MED ORDER — SODIUM CHLORIDE 0.9% FLUSH: 3.0000 mL | INTRAVENOUS | Status: DC | PRN

## 2017-03-12 MED ORDER — CEFAZOLIN SODIUM-DEXTROSE 2-4 GM/100ML-% IV SOLN
2.0000 g | Freq: Once | INTRAVENOUS | Status: AC
  Administered 2017-03-12: 2 g via INTRAVENOUS

## 2017-03-12 MED ORDER — CLONIDINE HCL 0.2 MG PO TABS
0.2000 mg | ORAL_TABLET | Freq: Two times a day (BID) | ORAL | Status: DC
  Administered 2017-03-12 – 2017-03-18 (×11): 0.2 mg via ORAL
  Filled 2017-03-12 (×12): qty 1

## 2017-03-12 MED ORDER — ACETAMINOPHEN 325 MG PO TABS: 650.0000 mg | ORAL_TABLET | ORAL | Status: DC | PRN

## 2017-03-12 MED ORDER — ATORVASTATIN CALCIUM 40 MG PO TABS
40.0000 mg | ORAL_TABLET | Freq: Every day | ORAL | Status: DC
  Administered 2017-03-13 – 2017-03-17 (×5): 40 mg via ORAL
  Filled 2017-03-12 (×5): qty 1

## 2017-03-12 SURGICAL SUPPLY — 12 items
CATH OMNI FLUSH 5F 65CM (CATHETERS) ×1 IMPLANT
COVER PRB 48X5XTLSCP FOLD TPE (BAG) IMPLANT
COVER PROBE 5X48 (BAG) ×2
FILTER CO2 0.2 MICRON (VASCULAR PRODUCTS) ×1 IMPLANT
KIT PV (KITS) ×2 IMPLANT
RESERVOIR CO2 (VASCULAR PRODUCTS) ×1 IMPLANT
SET FLUSH CO2 (MISCELLANEOUS) ×1 IMPLANT
SHEATH PINNACLE 5F 10CM (SHEATH) ×1 IMPLANT
SYR MEDRAD MARK V 150ML (SYRINGE) ×2 IMPLANT
TRANSDUCER W/STOPCOCK (MISCELLANEOUS) ×2 IMPLANT
TRAY PV CATH (CUSTOM PROCEDURE TRAY) ×2 IMPLANT
WIRE BENTSON .035X145CM (WIRE) ×1 IMPLANT

## 2017-03-12 NOTE — Progress Notes (Signed)
   Daily Progress Note   I had a extended conversation with this patient's family.  His functional status has been in decline for the last few months and they don't think he is capable of caring for himself at home anymore.  Intra-procedure today, he demonstrates altered mental status, which per the family seems to be more consistent with dementia rather than delirium.  - We discussed he needed a L AKA.  I suspect a L BKA might in the short term heal but in the long term, he likely would need conversion to an AKA.  - Will get PT/OT/Social work evaluations  - Will be by in the AM to discuss possible amputation  - Keep NPO after midnight   Adele Barthel, MD, [facility] [place] Office: [phone] Pager: [phone]  03/12/2017, 5:52 PM

## 2017-03-12 NOTE — Op Note (Signed)
OPERATIVE NOTE   PROCEDURE: 1.  Right common femoral artery cannulation under ultrasound guidance 2.  Placement of catheter in aorta 3.  Aortogram with carbon dioxide 4.  Second order arterial selection 5.  Left leg runoff via catheter  PRE-OPERATIVE DIAGNOSIS: left foot gangrene  POST-OPERATIVE DIAGNOSIS: same as above   SURGEON: Adele Barthel, MD  ANESTHESIA: conscious sedation  ESTIMATED BLOOD LOSS: 50 cc  CONTRAST: 127 cc carbon dioxide, 35 cc IV contrast  FINDING(S):  Aorta and iliac arteries cannot be well visualized due to bowel gas and poor patient cooperation: suggestion of abdominal aortic aneurysm   Left common femoral artery patent with patent profunda femoral artery  Patent left superficial femoral artery with proximal stenosis >90%  Distal left superficial femoral artery with serial stenoses: 50-75% and >90%.  Patent left popliteal artery with 50% stenosis at the knee  Essential obliteration of all tibial arteries with no named vessel patent: extensive collaterals thoughout   SPECIMEN(S):  none  INDICATIONS:   Leonard [name] is a 67 y.o. male with chronic kidney disease stage III-IV who presents with left foot gangrene.  The patient presents for: aortogram with carbon dioxide, left leg runoff and possible intervention.  I discussed with the patient the nature of angiographic procedures, especially the limited patencies of any endovascular intervention.  The patient is aware of that the risks of an angiographic procedure include but are not limited to: bleeding, infection, access site complications, renal failure, embolization, rupture of vessel, dissection, possible need for emergent surgical intervention, possible need for surgical procedures to treat the patient's pathology, and stroke and death.  The patient is aware of the risks and agrees to proceed.  DESCRIPTION: After full informed consent was obtained from the patient, the patient was brought back  to the angiography suite.  The patient was placed supine upon the angiography table and connected to cardiopulmonary monitoring equipment.  The patient was then given conscious sedation, the amounts of which are documented in the patient's chart.  A circulating radiologic technician maintained continuous monitoring of the patient's cardiopulmonary status.  Additionally, the control room radiologic technician provided backup monitoring throughout the procedure.  The patient was prepped and drape in the standard fashion for an angiographic procedure.  At this point, attention was turned to the right groin.  Under ultrasound guidance,  The subcutaneous tissue surrounding the right common femoral artery was anesthesized with 1% lidocaine with epinephrine.  The artery was then cannulated with a 18 gauge needle.  The Bentson wire was passed up into the aorta.  The needle was exchanged for a 5-Fr sheath, which was advanced over the wire into the common femoral artery.  The dilator was then removed.  I loaded the Omniflush catheter over the wire to assist with traversing the iliac arterial segment.  The Omniflush catheter was then loaded over the wire up to the level of L1.  The catheter was connected to the carbon dioxide circuit.  A carbon dioxide aortogram was completed.  This was essentially non-diagnostic due to the bowel gas and poor patient cooperation.  I pulled the catheter into the distal aorta.  Another carbon dioxide injection to image the iliac segments was completed.  Again the imaging was poor quality likely due to the presence of the likely abdominal aortic aneurysm.    Using a Bentson wire and Omniflush catheter, the left common iliac artery was selected.  The catheter and wire were advanced into the external iliac artery.  I removed  the wire and connected the catheter to the power injection.  An automated left leg runoff was completed.  The findings are listed above.  Based on the images, the left  foot is likely not salvageable.  The patient will need to consider left above-knee amputation.   COMPLICATIONS: none  CONDITION: stable   Adele Barthel, MD, [facility] [place] Office: [phone] Pager: [phone]  03/12/2017, 5:09 PM

## 2017-03-12 NOTE — Progress Notes (Addendum)
Site area: rt groin fa sheath pulled and pressure held  by [employer] Site Prior to Removal:  Level 0 Pressure Applied For: 20 minutes Manual:   yes Patient Status During Pull:  stable Post Pull Site:  Level  0 Post Pull Instructions Given:  yes Post Pull Pulses Present: dopplered rt peroneal Dressing Applied:  Gauze and tegaderm Bedrest begins @ 8242 Comments:

## 2017-03-12 NOTE — Interval H&P Note (Signed)
History and Physical Interval Note:  03/12/2017 2:43 PM  Leonard [name]  has presented today for surgery, with the diagnosis of pvd  The various methods of treatment have been discussed with the patient and family. After consideration of risks, benefits and other options for treatment, the patient has consented to  Procedure(s): ABDOMINAL AORTOGRAM W/LOWER EXTREMITY (N/A) as a surgical intervention .  The patient's history has been reviewed, patient examined, no change in status, stable for surgery.  I have reviewed the patient's chart and labs.  Questions were answered to the patient's satisfaction.     Adele Barthel

## 2017-03-13 ENCOUNTER — Telehealth: Payer: Self-pay | Admitting: Vascular Surgery

## 2017-03-13 ENCOUNTER — Encounter (HOSPITAL_COMMUNITY): Payer: Self-pay | Admitting: Vascular Surgery

## 2017-03-13 ENCOUNTER — Inpatient Hospital Stay: Payer: Medicare Other

## 2017-03-13 ENCOUNTER — Other Ambulatory Visit: Payer: Medicare Other

## 2017-03-13 DIAGNOSIS — Z794 Long term (current) use of insulin: Secondary | ICD-10-CM | POA: Diagnosis not present

## 2017-03-13 DIAGNOSIS — Z85828 Personal history of other malignant neoplasm of skin: Secondary | ICD-10-CM | POA: Diagnosis not present

## 2017-03-13 DIAGNOSIS — E1122 Type 2 diabetes mellitus with diabetic chronic kidney disease: Secondary | ICD-10-CM | POA: Diagnosis not present

## 2017-03-13 DIAGNOSIS — D509 Iron deficiency anemia, unspecified: Secondary | ICD-10-CM | POA: Diagnosis not present

## 2017-03-13 DIAGNOSIS — I700 Atherosclerosis of aorta: Secondary | ICD-10-CM | POA: Diagnosis not present

## 2017-03-13 DIAGNOSIS — N184 Chronic kidney disease, stage 4 (severe): Secondary | ICD-10-CM | POA: Diagnosis not present

## 2017-03-13 DIAGNOSIS — E1152 Type 2 diabetes mellitus with diabetic peripheral angiopathy with gangrene: Secondary | ICD-10-CM | POA: Diagnosis not present

## 2017-03-13 DIAGNOSIS — E7800 Pure hypercholesterolemia, unspecified: Secondary | ICD-10-CM | POA: Diagnosis not present

## 2017-03-13 DIAGNOSIS — M109 Gout, unspecified: Secondary | ICD-10-CM | POA: Diagnosis not present

## 2017-03-13 DIAGNOSIS — F0390 Unspecified dementia without behavioral disturbance: Secondary | ICD-10-CM | POA: Diagnosis present

## 2017-03-13 DIAGNOSIS — I130 Hypertensive heart and chronic kidney disease with heart failure and stage 1 through stage 4 chronic kidney disease, or unspecified chronic kidney disease: Secondary | ICD-10-CM | POA: Diagnosis not present

## 2017-03-13 DIAGNOSIS — Z7982 Long term (current) use of aspirin: Secondary | ICD-10-CM | POA: Diagnosis not present

## 2017-03-13 DIAGNOSIS — Z87891 Personal history of nicotine dependence: Secondary | ICD-10-CM | POA: Diagnosis not present

## 2017-03-13 DIAGNOSIS — Z951 Presence of aortocoronary bypass graft: Secondary | ICD-10-CM | POA: Diagnosis not present

## 2017-03-13 DIAGNOSIS — I509 Heart failure, unspecified: Secondary | ICD-10-CM | POA: Diagnosis not present

## 2017-03-13 DIAGNOSIS — J449 Chronic obstructive pulmonary disease, unspecified: Secondary | ICD-10-CM | POA: Diagnosis present

## 2017-03-13 DIAGNOSIS — Z7901 Long term (current) use of anticoagulants: Secondary | ICD-10-CM | POA: Diagnosis not present

## 2017-03-13 DIAGNOSIS — Z79899 Other long term (current) drug therapy: Secondary | ICD-10-CM | POA: Diagnosis not present

## 2017-03-13 DIAGNOSIS — I96 Gangrene, not elsewhere classified: Secondary | ICD-10-CM | POA: Diagnosis not present

## 2017-03-13 DIAGNOSIS — I2510 Atherosclerotic heart disease of native coronary artery without angina pectoris: Secondary | ICD-10-CM | POA: Diagnosis not present

## 2017-03-13 LAB — BASIC METABOLIC PANEL
ANION GAP: 13 (ref 5–15)
BUN: 63 mg/dL — ABNORMAL HIGH (ref 6–20)
CHLORIDE: 100 mmol/L — AB (ref 101–111)
CO2: 18 mmol/L — AB (ref 22–32)
Calcium: 8.2 mg/dL — ABNORMAL LOW (ref 8.9–10.3)
Creatinine, Ser: 2.76 mg/dL — ABNORMAL HIGH (ref 0.61–1.24)
GFR calc Af Amer: 26 mL/min — ABNORMAL LOW (ref >60)
GFR, EST NON AFRICAN AMERICAN: 23 mL/min — AB (ref >60)
GLUCOSE: 200 mg/dL — AB (ref 65–99)
POTASSIUM: 4.5 mmol/L (ref 3.5–5.1)
Sodium: 131 mmol/L — ABNORMAL LOW (ref 135–145)

## 2017-03-13 LAB — GLUCOSE, CAPILLARY
GLUCOSE-CAPILLARY: 196 mg/dL — AB (ref 65–99)
GLUCOSE-CAPILLARY: 204 mg/dL — AB (ref 65–99)
GLUCOSE-CAPILLARY: 193 mg/dL — AB (ref 65–99)
GLUCOSE-CAPILLARY: 149 mg/dL — AB (ref 65–99)

## 2017-03-13 LAB — SURGICAL PCR SCREEN
MRSA, PCR: NEGATIVE
Staphylococcus aureus: NEGATIVE

## 2017-03-13 MED ORDER — VANCOMYCIN HCL IN DEXTROSE 750-5 MG/150ML-% IV SOLN
750.0000 mg | INTRAVENOUS | Status: DC
  Administered 2017-03-13 – 2017-03-16 (×3): 750 mg via INTRAVENOUS
  Filled 2017-03-13 (×5): qty 150

## 2017-03-13 MED ORDER — PIPERACILLIN-TAZOBACTAM 3.375 G IVPB
3.3750 g | Freq: Three times a day (TID) | INTRAVENOUS | Status: DC
  Administered 2017-03-13 – 2017-03-17 (×11): 3.375 g via INTRAVENOUS
  Filled 2017-03-13 (×14): qty 50

## 2017-03-13 MED ORDER — DARBEPOETIN ALFA 500 MCG/ML IJ SOSY
PREFILLED_SYRINGE | INTRAMUSCULAR | Status: AC
  Filled 2017-03-13: qty 1

## 2017-03-13 MED FILL — Heparin Sodium (Porcine) 2 Unit/ML in Sodium Chloride 0.9%: INTRAMUSCULAR | Qty: 1000 | Status: AC

## 2017-03-13 MED FILL — Lidocaine HCl Local Inj 1%: INTRAMUSCULAR | Qty: 20 | Status: AC

## 2017-03-13 NOTE — Evaluation (Signed)
Physical Therapy Evaluation Patient Details Name: Leonard [name] MRN: [id number] DOB: [birth year] Today's Date: 03/13/2017   History of Present Illness  Pt adm with lt foot gangrene. MD reports possible amputation vs palliative care. PMH - PAD, HTN, CAD, rt toe amputations, chf, ckd, possible dementia  Clinical Impression  Pt admitted with above diagnosis and presents to PT with functional limitations due to deficits listed below (See PT problem list). Pt needs skilled PT to maximize independence and safety to allow discharge to SNF. Pt unable to perform basic mobility without assistance and doubt family can provide this incr level of care at home.     Follow Up Recommendations SNF    Equipment Recommendations  None recommended by PT    Recommendations for Other Services       Precautions / Restrictions Precautions Precautions: Fall Restrictions Weight Bearing Restrictions: No      Mobility  Bed Mobility Overal bed mobility: Needs Assistance Bed Mobility: Supine to Sit;Sit to Supine     Supine to sit: Mod assist Sit to supine: Min guard   General bed mobility comments: Assist to bring legs off of bed, elevate trunk into sitting, and bring hips to EOB. Incr assist needed due to pt with decreased intitiation on movement. Assist for safety when returning to supine  Transfers                 General transfer comment: Did not attempt due to pain in lt foot.  Ambulation/Gait             General Gait Details: Did not attempt due to pain in lt foot  Stairs            Wheelchair Mobility    Modified Rankin (Stroke Patients Only)       Balance Overall balance assessment: Needs assistance Sitting-balance support: Bilateral upper extremity supported;Feet supported Sitting balance-Leahy Scale: Poor Sitting balance - Comments: UE support. Pt sat EOB x 6-7 minutes before requesting to lie back down due to fatigue and pain.                                      Pertinent Vitals/Pain Pain Assessment: Faces Faces Pain Scale: Hurts even more Pain Location: lt foot Pain Descriptors / Indicators: Guarding;Grimacing Pain Intervention(s): Limited activity within patient's tolerance;Monitored during session;Premedicated before session;Repositioned    Home Living Family/patient expects to be discharged to:: Skilled nursing facility Living Arrangements: Other relatives(sister) Available Help at Discharge: Family;Available PRN/intermittently Type of Home: Apartment Home Access: Level entry     Home Layout: One level Home Equipment: Walker - 2 wheels;Cane - quad;Shower seat;Adaptive equipment Additional Comments: Information from prior encounter    Prior Function Level of Independence: Needs assistance   Gait / Transfers Assistance Needed: Pt reports amb with walker but could not state if needed assist           Hand Dominance   Dominant Hand: Right    Extremity/Trunk Assessment   Upper Extremity Assessment Upper Extremity Assessment: Defer to OT evaluation    Lower Extremity Assessment Lower Extremity Assessment: Generalized weakness;LLE deficits/detail LLE Deficits / Details: Limited by painful lt foot.        Communication   Communication: No difficulties  Cognition Arousal/Alertness: Awake/alert Behavior During Therapy: Flat affect Overall Cognitive Status: No family/caregiver present to determine baseline cognitive functioning Area of Impairment: Orientation;Memory;Following commands;Safety/judgement;Problem solving  Orientation Level: Disoriented to;Time;Situation   Memory: Decreased short-term memory Following Commands: Follows one step commands with increased time Safety/Judgement: Decreased awareness of safety;Decreased awareness of deficits   Problem Solving: Slow processing;Decreased initiation;Requires verbal cues;Requires tactile cues        General Comments General  comments (skin integrity, edema, etc.): VSS stable    Exercises     Assessment/Plan    PT Assessment Patient needs continued PT services  PT Problem List Decreased strength;Decreased activity tolerance;Decreased balance;Decreased mobility;Decreased cognition;Pain       PT Treatment Interventions DME instruction;Gait training;Functional mobility training;Therapeutic activities;Therapeutic exercise;Balance training;Patient/family education    PT Goals (Current goals can be found in the Care Plan section)  Acute Rehab PT Goals Patient Stated Goal: Not stated PT Goal Formulation: Patient unable to participate in goal setting Time For Goal Achievement: 03/27/17 Potential to Achieve Goals: Fair    Frequency Min 2X/week   Barriers to discharge Decreased caregiver support      Co-evaluation               AM-PAC PT "6 Clicks" Daily Activity  Outcome Measure Difficulty turning over in bed (including adjusting bedclothes, sheets and blankets)?: Unable Difficulty moving from lying on back to sitting on the side of the bed? : Unable Difficulty sitting down on and standing up from a chair with arms (e.g., wheelchair, bedside commode, etc,.)?: Unable Help needed moving to and from a bed to chair (including a wheelchair)?: Total Help needed walking in hospital room?: Total Help needed climbing 3-5 steps with a railing? : Total 6 Click Score: 6    End of Session   Activity Tolerance: Patient limited by fatigue;Patient limited by pain Patient left: in bed;with call bell/phone within reach;with bed alarm set Nurse Communication: Mobility status PT Visit Diagnosis: Other abnormalities of gait and mobility (R26.89);Muscle weakness (generalized) (M62.81);Pain Pain - Right/Left: Left Pain - part of body: Ankle and joints of foot    Time: 7867-5449 PT Time Calculation (min) (ACUTE ONLY): 14 min   Charges:   PT Evaluation $PT Eval Moderate Complexity: 1 Mod     PT G CodesMarland Kitchen         [facility] [place] 03/13/2017, 1:55 PM

## 2017-03-13 NOTE — Progress Notes (Addendum)
   Daily Progress Note   Assessment/Planning:   POD #1 s/p Ao, LRo for L foot gangrene, CKD stage 3, AMS   Discussed the findings of the angiogram with the patient  He essentially has three options:  Palliative care: abx, wound, pain control  L AKA: BKA might heal but suspect would convert to AKA with time  L toe amputations vs TMA: unlikely to heal  Family noted decreased cognitive function at home, so suspect early onset dementia.  PT/OT/Social work C/S entered.  SNF placement likely needed given family's inability to assist the patient.  Dry dressing to L foot daily  Start broad spectrum antibiotics empirically for bactermia   Subjective  - 1 Day Post-Op   No complaints   Objective   Vitals:   03/12/17 1845 03/12/17 1913 03/12/17 2344 03/13/17 0412  BP: (!) 149/52 (!) 135/57 137/61 (!) 145/58  Pulse: 75 75 73 73  Resp: 17 17 18 18   Temp:   98.8 F (37.1 C) 99.5 F (37.5 C)  TempSrc:   Oral Oral  SpO2: 100% 100% 100% 100%  Weight:    174 lb 6.1 oz (79.1 kg)  Height:         Intake/Output Summary (Last 24 hours) at 03/13/2017 0813 Last data filed at 03/13/2017 0425 Gross per 24 hour  Intake 819.12 ml  Output 350 ml  Net 469.12 ml    PULM  CTAB  CV  RRR  GI  soft, NTND  VASC R groin without hematoma, no thrill  NEURO Alert, oriented to self but confused    Laboratory   CBC CBC Latest Ref Rng & Units 03/12/2017 03/12/2017 02/27/2017  WBC 4.0 - 10.5 K/uL 11.0(H) - 6.5  Hemoglobin 13.0 - 17.0 g/dL 7.3(L) 8.8(L) 8.5(L)  Hematocrit 39.0 - 52.0 % 24.4(L) 26.0(L) 28.0(L)  Platelets 150 - 400 K/uL 334 - 249    BMET    Component Value Date/Time   NA 131 (L) 03/13/2017 0339   NA 139 02/11/2017 1220   NA 140 08/16/2015 1554   K 4.5 03/13/2017 0339   K 3.8 08/16/2015 1554   CL 100 (L) 03/13/2017 0339   CO2 18 (L) 03/13/2017 0339   CO2 21 (L) 08/16/2015 1554   GLUCOSE 200 (H) 03/13/2017 0339   GLUCOSE 133 08/16/2015 1554   BUN 63 (H) 03/13/2017 0339    BUN 54 (H) 02/11/2017 1220   BUN 51.0 (H) 08/16/2015 1554   CREATININE 2.76 (H) 03/13/2017 0339   CREATININE 3.64 (H) 01/09/2016 1606   CREATININE 2.7 (H) 08/16/2015 1554   CALCIUM 8.2 (L) 03/13/2017 0339   CALCIUM NOPLAS 10/24/2015 1415   CALCIUM 8.9 08/16/2015 1554   GFRNONAA 23 (L) 03/13/2017 0339   GFRNONAA 27 (L) 07/20/2015 0949   GFRAA 26 (L) 03/13/2017 0339   GFRAA 31 (L) 07/20/2015 2620     Adele Barthel, MD, FACS [facility] Office: [phone] Pager: [phone]  03/13/2017, 8:13 AM

## 2017-03-13 NOTE — Telephone Encounter (Signed)
-----   Message from Mena Goes, RN sent at 03/13/2017 10:18 AM EST ----- Regarding: 2 weeks follow up on aortogram   ----- Message ----- From: Conrad Taylor Mill, MD Sent: 03/12/2017   5:16 PM To: [address]  Leonard [name] [id number] [birth year]  PROCEDURE: 1.  Right common femoral artery cannulation under ultrasound guidance 2.  Placement of catheter in aorta 3.  Aortogram with carbon dioxide 4.  Second order arterial selection 5.  Left leg runoff via catheter  F/U: 2 weeks

## 2017-03-13 NOTE — Telephone Encounter (Signed)
Sched appt 03/25/17 at 2:00. Spoke to pt's sister to inform them of appt.

## 2017-03-13 NOTE — Evaluation (Signed)
Occupational Therapy Evaluation Patient Details Name: Leonard [name] MRN: [id number] DOB: 1950-05-18 Today's Date: 03/13/2017    History of Present Illness Pt adm with lt foot gangrene. MD reports possible amputation vs palliative care. PMH - PAD, HTN, CAD, rt toe amputations, chf, ckd, possible dementia   Clinical Impression   Pt with decline in function and safety with ADLs and ADL mobility with decreased strength, balance, endurance and cognition. Pt's sister reports that he has hx of cognitive deficits, however it seem worse than PTA. Pt's sister provided PLOF and home environment info as pt not an accurate historian and eyes closed 50% of session. Pt required total A with ADLs/selfcare at this time. Pt would benefit from acute OT services to address impairments to maximize level of function and safety    Follow Up Recommendations  SNF;Supervision/Assistance - 24 hour    Equipment Recommendations  None recommended by OT;Other (comment)(TBD at next venue of care)    Recommendations for Other Services       Precautions / Restrictions Precautions Precautions: Fall Restrictions Weight Bearing Restrictions: No      Mobility Bed Mobility Overal bed mobility: Needs Assistance Bed Mobility: Supine to Sit;Sit to Supine     Supine to sit: Mod assist Sit to supine: Min assist   General bed mobility comments: Assist with multimodal cues to bring LEs to EOB and to elevate trunk  Transfers                 General transfer comment: Did not attempt due to L foot pain, will need +2 assist    Balance Overall balance assessment: Needs assistance Sitting-balance support: Bilateral upper extremity supported;Feet supported Sitting balance-Leahy Scale: Poor Sitting balance - Comments: required max A with balance/support. Sat EOB 3-4 minutes before requesting to return to supine due to pain and fatigue                                   ADL either performed or  assessed with clinical judgement   ADL Overall ADL's : Needs assistance/impaired Eating/Feeding: NPO Eating/Feeding Details (indicate cue type and reason): scheduled for surgery Grooming: Total assistance;Bed level   Upper Body Bathing: Total assistance;Bed level   Lower Body Bathing: Total assistance;Bed level   Upper Body Dressing : Total assistance;Bed level   Lower Body Dressing: Total assistance;Bed level   Toilet Transfer: Total assistance Toilet Transfer Details (indicate cue type and reason): would need + 2 assist Toileting- Clothing Manipulation and Hygiene: Total assistance;Bed level         General ADL Comments: pt with eyes opended 50% of time, but answering questions. Pt's sister pressent and able to provide PLOF and home environment info. She states that pt's cogntion worse than PTA and that he has assist at home with ADLs, had a good appetite PTA     Vision Patient Visual Report: (unable to properly assess at this time)       Perception     Praxis      Pertinent Vitals/Pain Pain Assessment: Faces Faces Pain Scale: Hurts even more Pain Location: L foot. Pt states that pain is 5/10, the states it's 10/10 Pain Descriptors / Indicators: Grimacing;Sore Pain Intervention(s): Limited activity within patient's tolerance;Monitored during session     Hand Dominance Right   Extremity/Trunk Assessment Upper Extremity Assessment Upper Extremity Assessment: Generalized weakness   Lower Extremity Assessment Lower Extremity Assessment: Defer to PT evaluation  LLE Deficits / Details: Limited by painful lt foot.        Communication Communication Communication: No difficulties   Cognition Arousal/Alertness: Awake/alert;Lethargic Behavior During Therapy: Flat affect Overall Cognitive Status: Difficult to assess(sister present and states that pt has cogntive deficits PTA, but is worse at this time) Area of Impairment: Orientation;Memory;Following  commands;Safety/judgement;Problem solving                 Orientation Level: Disoriented to;Time;Situation   Memory: Decreased short-term memory Following Commands: Follows one step commands with increased time Safety/Judgement: Decreased awareness of safety;Decreased awareness of deficits   Problem Solving: Slow processing;Decreased initiation;Requires verbal cues;Requires tactile cues     General Comments  VSS stable    Exercises     Shoulder Instructions      Home Living Family/patient expects to be discharged to:: Skilled nursing facility Living Arrangements: Other relatives(sister) Available Help at Discharge: Family;Available PRN/intermittently Type of Home: Apartment Home Access: Level entry     Home Layout: One level     Bathroom Shower/Tub: Teacher, early years/pre: Handicapped height     Home Equipment: Environmental consultant - 2 wheels;Cane - quad;Shower seat;Adaptive equipment Adaptive Equipment: Reacher;Sock aid;Long-handled shoe horn;Long-handled sponge Additional Comments: Information from prior encounter      Prior Functioning/Environment Level of Independence: Needs assistance  Gait / Transfers Assistance Needed: Pt reports amb with walker but could not state if needed assist ADL's / Homemaking Assistance Needed: pt has assist with bathing and dressing x 1 year per his sister, needs cues for dressing ans fatigues easily            OT Problem List: Decreased strength;Decreased activity tolerance;Decreased cognition;Decreased knowledge of use of DME or AE;Impaired balance (sitting and/or standing);Decreased coordination;Decreased safety awareness;Pain      OT Treatment/Interventions: Self-care/ADL training;DME and/or AE instruction;Therapeutic activities;Balance training;Therapeutic exercise;Patient/family education    OT Goals(Current goals can be found in the care plan section) Acute Rehab OT Goals Patient Stated Goal: go to rehab and get better  per pt sister, none stated by pt OT Goal Formulation: With family Time For Goal Achievement: 03/27/17 Potential to Achieve Goals: Good ADL Goals Pt Will Perform Grooming: with mod assist;sitting Pt Will Perform Upper Body Bathing: with mod assist;sitting Pt Will Perform Upper Body Dressing: with mod assist;sitting Pt Will Transfer to Toilet: with mod assist;with max assist;with +2 assist;stand pivot transfer;bedside commode Additional ADL Goal #1: pt will complete bed mobility with min A to sit EOB in prep for selfcare  OT Frequency: Min 2X/week   Barriers to D/C: Decreased caregiver support          Co-evaluation              AM-PAC PT "6 Clicks" Daily Activity     Outcome Measure Help from another person eating meals?: Total(NPO) Help from another person taking care of personal grooming?: Total Help from another person toileting, which includes using toliet, bedpan, or urinal?: Total Help from another person bathing (including washing, rinsing, drying)?: Total Help from another person to put on and taking off regular upper body clothing?: Total Help from another person to put on and taking off regular lower body clothing?: Total 6 Click Score: 6   End of Session    Activity Tolerance: Patient limited by fatigue;Patient limited by pain Patient left: in bed;with call bell/phone within reach;with bed alarm set  OT Visit Diagnosis: Other abnormalities of gait and mobility (R26.89);Muscle weakness (generalized) (M62.81);Other symptoms and signs involving cognitive function;Pain  Pain - Right/Left: Left Pain - part of body: Ankle and joints of foot                Time: 7076-1518 OT Time Calculation (min): 25 min Charges:  OT General Charges $OT Visit: 1 Visit OT Evaluation $OT Eval Moderate Complexity: 1 Mod OT Treatments $Therapeutic Activity: 8-22 mins G-Codes: OT G-codes **NOT FOR INPATIENT CLASS** Functional Assessment Tool Used: AM-PAC 6 Clicks Daily Activity      Britt Bottom 03/13/2017, 2:34 PM

## 2017-03-13 NOTE — Progress Notes (Signed)
Pharmacy Antibiotic Note  Leonard [name] is a 67 y.o. male starting on Vancomycin and Zosyn for bacteremia coverage with left foot gangrene. S/p aortogram on 2/7.  Treatment options being discussed.   Tmax 99.5, WBC 11.0.  No cultures. CKD3.  Plan:  Vancomycin 750 mg IV q24hrs  Zosyn 3.375 gm IV q8hrs (each over 4 hours)  Target Vanc troughs 15-20 mcg/ml  Follow renal function, progress and treatment decision.  Coumadin has been on old for aortogram and possible surgery.  Height: 5\' 8"  (172.7 cm) Weight: 174 lb 6.1 oz (79.1 kg) IBW/kg (Calculated) : 68.4  Temp (24hrs), Avg:99 F (37.2 C), Min:98.6 F (37 C), Max:99.5 F (37.5 C)  Recent Labs  Lab 03/12/17 1242 03/12/17 2129 03/13/17 0339  WBC  --  11.0*  --   CREATININE 3.00* 2.90* 2.76*    Estimated Creatinine Clearance: 25.8 mL/min (A) (by C-G formula based on SCr of 2.76 mg/dL (H)).    No Known Allergies  Antimicrobials this admission:   Cefazolin 2 gm X 1 on 2/7 prior to aortogram   Vanc 2/8>>   Zosyn 2/8>>  Dose adjustments this admission:  n/a  Microbiology results:  2/7 MRSA PCR - negative  Thank you for allowing pharmacy to be a part of this patient's care.  Arty Baumgartner, [place] Pager: [phone], or [phone] 03/13/2017 11:23 AM

## 2017-03-14 DIAGNOSIS — Z85828 Personal history of other malignant neoplasm of skin: Secondary | ICD-10-CM | POA: Diagnosis not present

## 2017-03-14 DIAGNOSIS — I130 Hypertensive heart and chronic kidney disease with heart failure and stage 1 through stage 4 chronic kidney disease, or unspecified chronic kidney disease: Secondary | ICD-10-CM | POA: Diagnosis not present

## 2017-03-14 DIAGNOSIS — E1152 Type 2 diabetes mellitus with diabetic peripheral angiopathy with gangrene: Secondary | ICD-10-CM | POA: Diagnosis not present

## 2017-03-14 DIAGNOSIS — I700 Atherosclerosis of aorta: Secondary | ICD-10-CM | POA: Diagnosis not present

## 2017-03-14 DIAGNOSIS — N184 Chronic kidney disease, stage 4 (severe): Secondary | ICD-10-CM | POA: Diagnosis not present

## 2017-03-14 DIAGNOSIS — I96 Gangrene, not elsewhere classified: Secondary | ICD-10-CM | POA: Diagnosis not present

## 2017-03-14 LAB — GLUCOSE, CAPILLARY
GLUCOSE-CAPILLARY: 211 mg/dL — AB (ref 65–99)
GLUCOSE-CAPILLARY: 216 mg/dL — AB (ref 65–99)
GLUCOSE-CAPILLARY: 220 mg/dL — AB (ref 65–99)
Glucose-Capillary: 194 mg/dL — ABNORMAL HIGH (ref 65–99)

## 2017-03-14 MED ORDER — DEXTROSE 5 % IV SOLN
1.5000 g | INTRAVENOUS | Status: AC
  Administered 2017-03-15: 1.5 g via INTRAVENOUS
  Filled 2017-03-14: qty 1.5

## 2017-03-14 NOTE — Plan of Care (Signed)
  Progressing Education: Knowledge of General Education information will improve 03/14/2017 2154 - Progressing by Drenda Freeze, RN Health Behavior/Discharge Planning: Ability to manage health-related needs will improve 03/14/2017 2154 - Progressing by Drenda Freeze, RN Clinical Measurements: Ability to maintain clinical measurements within normal limits will improve 03/14/2017 2154 - Progressing by Drenda Freeze, RN

## 2017-03-14 NOTE — Progress Notes (Addendum)
[facility] proceed with amputation.  Sister and brother in law in room.  They will check with the rest of the family as far a planned date that will work for them.   Objective (!) 126/50 75 99.5 F (37.5 C) (Axillary) 20 97%  Intake/Output Summary (Last 24 hours) at 03/14/2017 0905 Last data filed at 03/14/2017 0300 Gross per 24 hour  Intake 380 ml  Output 775 ml  Net -395 ml    Left foot gangrene dry dressing in place, no viable signals of blood flow to the left foot.  Malodorous left foot wound. Right groin without hematoma Heart RRR Lungs non labored breathing Gen NAD  Assessment/Planning: POD # 2 post angiogram   No viable revascularization options.  The left LE is not salvageable.  We will plan left AKA tomorrow with Dr. Donzetta Matters.  If the family wants to wait until Mon or Tuesday they will let the nursing staff know later today. NPO past MN.  Roxy Horseman 03/14/2017 9:05 AM --  Laboratory Lab Results: Recent Labs    03/12/17 1242 03/12/17 2129  WBC  --  11.0*  HGB 8.8* 7.3*  HCT 26.0* 24.4*  PLT  --  334   BMET Recent Labs    03/12/17 1242 03/12/17 2129 03/13/17 0339  NA 132*  --  131*  K 5.0  --  4.5  CL 98*  --  100*  CO2  --   --  18*  GLUCOSE 225*  --  200*  BUN 65*  --  63*  CREATININE 3.00* 2.90* 2.76*  CALCIUM  --   --  8.2*    COAG Lab Results  Component Value Date   INR 2.29 03/12/2017   INR 3.4 03/06/2017   INR 2.3 03/03/2017   No results found for: PTT   I have interviewed and examined patient with PA and agree with assessment and plan above. Plan for left AKA tomorrow pending family discussion.  Brandon C. Donzetta Matters, MD [facility] Office: [phone] Pager: [phone]

## 2017-03-15 ENCOUNTER — Inpatient Hospital Stay (HOSPITAL_COMMUNITY): Payer: Medicare Other | Admitting: Certified Registered"

## 2017-03-15 ENCOUNTER — Encounter (HOSPITAL_COMMUNITY)
Admission: RE | Disposition: A | Discharge status: Skilled Nursing Facility | Payer: Self-pay | Source: Ambulatory Visit | Attending: Vascular Surgery

## 2017-03-15 DIAGNOSIS — E1122 Type 2 diabetes mellitus with diabetic chronic kidney disease: Secondary | ICD-10-CM | POA: Diagnosis not present

## 2017-03-15 DIAGNOSIS — M109 Gout, unspecified: Secondary | ICD-10-CM | POA: Diagnosis not present

## 2017-03-15 DIAGNOSIS — I2510 Atherosclerotic heart disease of native coronary artery without angina pectoris: Secondary | ICD-10-CM | POA: Diagnosis not present

## 2017-03-15 DIAGNOSIS — I96 Gangrene, not elsewhere classified: Secondary | ICD-10-CM | POA: Diagnosis not present

## 2017-03-15 DIAGNOSIS — E1152 Type 2 diabetes mellitus with diabetic peripheral angiopathy with gangrene: Secondary | ICD-10-CM | POA: Diagnosis not present

## 2017-03-15 DIAGNOSIS — I509 Heart failure, unspecified: Secondary | ICD-10-CM | POA: Diagnosis not present

## 2017-03-15 DIAGNOSIS — N184 Chronic kidney disease, stage 4 (severe): Secondary | ICD-10-CM | POA: Diagnosis not present

## 2017-03-15 DIAGNOSIS — Z85828 Personal history of other malignant neoplasm of skin: Secondary | ICD-10-CM | POA: Diagnosis not present

## 2017-03-15 DIAGNOSIS — D509 Iron deficiency anemia, unspecified: Secondary | ICD-10-CM | POA: Diagnosis not present

## 2017-03-15 DIAGNOSIS — I70262 Atherosclerosis of native arteries of extremities with gangrene, left leg: Secondary | ICD-10-CM | POA: Diagnosis not present

## 2017-03-15 DIAGNOSIS — I130 Hypertensive heart and chronic kidney disease with heart failure and stage 1 through stage 4 chronic kidney disease, or unspecified chronic kidney disease: Secondary | ICD-10-CM | POA: Diagnosis not present

## 2017-03-15 DIAGNOSIS — E7800 Pure hypercholesterolemia, unspecified: Secondary | ICD-10-CM | POA: Diagnosis not present

## 2017-03-15 DIAGNOSIS — I700 Atherosclerosis of aorta: Secondary | ICD-10-CM | POA: Diagnosis not present

## 2017-03-15 HISTORY — PX: AMPUTATION: SHX166

## 2017-03-15 LAB — BASIC METABOLIC PANEL
Anion gap: 12 (ref 5–15)
BUN: 66 mg/dL — ABNORMAL HIGH (ref 6–20)
CHLORIDE: 100 mmol/L — AB (ref 101–111)
CO2: 19 mmol/L — AB (ref 22–32)
CREATININE: 2.79 mg/dL — AB (ref 0.61–1.24)
Calcium: 8.2 mg/dL — ABNORMAL LOW (ref 8.9–10.3)
GFR calc non Af Amer: 22 mL/min — ABNORMAL LOW (ref >60)
GFR, EST AFRICAN AMERICAN: 26 mL/min — AB (ref >60)
Glucose, Bld: 219 mg/dL — ABNORMAL HIGH (ref 65–99)
Potassium: 4.4 mmol/L (ref 3.5–5.1)
Sodium: 131 mmol/L — ABNORMAL LOW (ref 135–145)

## 2017-03-15 LAB — GLUCOSE, CAPILLARY
GLUCOSE-CAPILLARY: 203 mg/dL — AB (ref 65–99)
GLUCOSE-CAPILLARY: 169 mg/dL — AB (ref 65–99)
Glucose-Capillary: 195 mg/dL — ABNORMAL HIGH (ref 65–99)
Glucose-Capillary: 229 mg/dL — ABNORMAL HIGH (ref 65–99)
Glucose-Capillary: 202 mg/dL — ABNORMAL HIGH (ref 65–99)

## 2017-03-15 LAB — PREPARE RBC (CROSSMATCH)

## 2017-03-15 LAB — CBC
HCT: 23.6 % — ABNORMAL LOW (ref 39.0–52.0)
Hemoglobin: 7.2 g/dL — ABNORMAL LOW (ref 13.0–17.0)
MCH: 21.5 pg — AB (ref 26.0–34.0)
MCHC: 30.5 g/dL (ref 30.0–36.0)
MCV: 70.4 fL — AB (ref 78.0–100.0)
PLATELETS: 327 10*3/uL (ref 150–400)
RBC: 3.35 MIL/uL — AB (ref 4.22–5.81)
RDW: 19.8 % — ABNORMAL HIGH (ref 11.5–15.5)
WBC: 10.6 10*3/uL — ABNORMAL HIGH (ref 4.0–10.5)

## 2017-03-15 LAB — PROTIME-INR
INR: 1.97
Prothrombin Time: 22.2 seconds — ABNORMAL HIGH (ref 11.4–15.2)

## 2017-03-15 SURGERY — AMPUTATION, ABOVE KNEE
Anesthesia: General | Site: Leg Upper | Laterality: Left

## 2017-03-15 MED ORDER — NEOSTIGMINE METHYLSULFATE 10 MG/10ML IV SOLN
INTRAVENOUS | Status: DC | PRN
  Administered 2017-03-15: 4 mg via INTRAVENOUS

## 2017-03-15 MED ORDER — MIDAZOLAM HCL 2 MG/2ML IJ SOLN
INTRAMUSCULAR | Status: AC
  Filled 2017-03-15: qty 2

## 2017-03-15 MED ORDER — PHENYLEPHRINE 40 MCG/ML (10ML) SYRINGE FOR IV PUSH (FOR BLOOD PRESSURE SUPPORT)
PREFILLED_SYRINGE | INTRAVENOUS | Status: AC
  Filled 2017-03-15: qty 10

## 2017-03-15 MED ORDER — FENTANYL CITRATE (PF) 100 MCG/2ML IJ SOLN: 25.0000 ug | INTRAMUSCULAR | Status: DC | PRN

## 2017-03-15 MED ORDER — FENTANYL CITRATE (PF) 100 MCG/2ML IJ SOLN
INTRAMUSCULAR | Status: AC
  Administered 2017-03-15: 50 ug via INTRAVENOUS
  Filled 2017-03-15: qty 2

## 2017-03-15 MED ORDER — HYDROMORPHONE HCL 1 MG/ML IJ SOLN
0.2500 mg | INTRAMUSCULAR | Status: DC | PRN
  Administered 2017-03-15 (×4): 0.5 mg via INTRAVENOUS

## 2017-03-15 MED ORDER — PHENYLEPHRINE HCL 10 MG/ML IJ SOLN
INTRAVENOUS | Status: DC | PRN
  Administered 2017-03-15: 25 ug/min via INTRAVENOUS

## 2017-03-15 MED ORDER — FENTANYL CITRATE (PF) 100 MCG/2ML IJ SOLN
INTRAMUSCULAR | Status: DC | PRN
  Administered 2017-03-15: 100 ug via INTRAVENOUS
  Administered 2017-03-15: 50 ug via INTRAVENOUS

## 2017-03-15 MED ORDER — GLYCOPYRROLATE 0.2 MG/ML IJ SOLN
INTRAMUSCULAR | Status: DC | PRN
  Administered 2017-03-15: 0.6 mg via INTRAVENOUS

## 2017-03-15 MED ORDER — ROCURONIUM BROMIDE 100 MG/10ML IV SOLN
INTRAVENOUS | Status: DC | PRN
  Administered 2017-03-15: 20 mg via INTRAVENOUS

## 2017-03-15 MED ORDER — PROPOFOL 10 MG/ML IV BOLUS
INTRAVENOUS | Status: AC
  Filled 2017-03-15: qty 20

## 2017-03-15 MED ORDER — ALBUMIN HUMAN 5 % IV SOLN
INTRAVENOUS | Status: DC | PRN
  Administered 2017-03-15: 08:00:00 via INTRAVENOUS

## 2017-03-15 MED ORDER — FENTANYL CITRATE (PF) 250 MCG/5ML IJ SOLN
INTRAMUSCULAR | Status: AC
  Filled 2017-03-15: qty 5

## 2017-03-15 MED ORDER — 0.9 % SODIUM CHLORIDE (POUR BTL) OPTIME
TOPICAL | Status: DC | PRN
  Administered 2017-03-15: 1000 mL

## 2017-03-15 MED ORDER — FENTANYL CITRATE (PF) 100 MCG/2ML IJ SOLN
25.0000 ug | INTRAMUSCULAR | Status: DC | PRN
Start: 2017-03-15 — End: 2017-03-15
  Administered 2017-03-15 (×2): 50 ug via INTRAVENOUS

## 2017-03-15 MED ORDER — LIDOCAINE HCL (CARDIAC) 20 MG/ML IV SOLN
INTRAVENOUS | Status: DC | PRN
  Administered 2017-03-15: 60 mg via INTRAVENOUS

## 2017-03-15 MED ORDER — HYDROMORPHONE HCL 1 MG/ML IJ SOLN
INTRAMUSCULAR | Status: AC
  Administered 2017-03-15: 0.5 mg via INTRAVENOUS
  Filled 2017-03-15: qty 1

## 2017-03-15 MED ORDER — SUCCINYLCHOLINE CHLORIDE 20 MG/ML IJ SOLN
INTRAMUSCULAR | Status: DC | PRN
  Administered 2017-03-15: 80 mg via INTRAVENOUS

## 2017-03-15 MED ORDER — SODIUM CHLORIDE 0.9 % IV SOLN: Freq: Once | INTRAVENOUS | Status: DC

## 2017-03-15 MED ORDER — PROPOFOL 10 MG/ML IV BOLUS
INTRAVENOUS | Status: DC | PRN
  Administered 2017-03-15: 100 mg via INTRAVENOUS

## 2017-03-15 SURGICAL SUPPLY — 49 items
BANDAGE ACE 4X5 VEL STRL LF (GAUZE/BANDAGES/DRESSINGS) ×3 IMPLANT
BANDAGE ACE 6X5 VEL STRL LF (GAUZE/BANDAGES/DRESSINGS) ×3 IMPLANT
BLADE SAW GIGLI 510 (BLADE) ×2 IMPLANT
BLADE SAW GIGLI 510MM (BLADE) ×1
BNDG COHESIVE 6X5 TAN STRL LF (GAUZE/BANDAGES/DRESSINGS) ×3 IMPLANT
BNDG GAUZE ELAST 4 BULKY (GAUZE/BANDAGES/DRESSINGS) ×3 IMPLANT
CANISTER SUCT 3000ML PPV (MISCELLANEOUS) ×3 IMPLANT
CLIP VESOCCLUDE MED 6/CT (CLIP) ×3 IMPLANT
COVER SURGICAL LIGHT HANDLE (MISCELLANEOUS) ×3 IMPLANT
DRAIN CHANNEL 19F RND (DRAIN) IMPLANT
DRAPE HALF SHEET 40X57 (DRAPES) ×3 IMPLANT
DRAPE ORTHO SPLIT 77X108 STRL (DRAPES) ×6
DRAPE SURG ORHT 6 SPLT 77X108 (DRAPES) ×2 IMPLANT
DRSG ADAPTIC 3X8 NADH LF (GAUZE/BANDAGES/DRESSINGS) ×3 IMPLANT
ELECT CAUTERY BLADE 6.4 (BLADE) ×3 IMPLANT
ELECT REM PT RETURN 9FT ADLT (ELECTROSURGICAL) ×3
ELECTRODE REM PT RTRN 9FT ADLT (ELECTROSURGICAL) ×1 IMPLANT
EVACUATOR SILICONE 100CC (DRAIN) IMPLANT
GAUZE SPONGE 4X4 12PLY STRL (GAUZE/BANDAGES/DRESSINGS) ×3 IMPLANT
GAUZE SPONGE 4X4 12PLY STRL LF (GAUZE/BANDAGES/DRESSINGS) ×2 IMPLANT
GLOVE BIO SURGEON STRL SZ7.5 (GLOVE) ×3 IMPLANT
GLOVE BIOGEL PI IND STRL 7.0 (GLOVE) IMPLANT
GLOVE BIOGEL PI IND STRL 7.5 (GLOVE) IMPLANT
GLOVE BIOGEL PI INDICATOR 7.0 (GLOVE) ×2
GLOVE BIOGEL PI INDICATOR 7.5 (GLOVE) ×4
GLOVE ECLIPSE 7.0 STRL STRAW (GLOVE) ×2 IMPLANT
GLOVE SURG SS PI 7.5 STRL IVOR (GLOVE) ×2 IMPLANT
GOWN STRL REUS W/ TWL LRG LVL3 (GOWN DISPOSABLE) ×2 IMPLANT
GOWN STRL REUS W/ TWL XL LVL3 (GOWN DISPOSABLE) ×1 IMPLANT
GOWN STRL REUS W/TWL LRG LVL3 (GOWN DISPOSABLE) ×6
GOWN STRL REUS W/TWL XL LVL3 (GOWN DISPOSABLE) ×3
KIT BASIN OR (CUSTOM PROCEDURE TRAY) ×3 IMPLANT
KIT ROOM TURNOVER OR (KITS) ×3 IMPLANT
NS IRRIG 1000ML POUR BTL (IV SOLUTION) ×3 IMPLANT
PACK GENERAL/GYN (CUSTOM PROCEDURE TRAY) ×3 IMPLANT
PAD ABD 8X10 STRL (GAUZE/BANDAGES/DRESSINGS) ×2 IMPLANT
PAD ARMBOARD 7.5X6 YLW CONV (MISCELLANEOUS) ×6 IMPLANT
STAPLER VISISTAT 35W (STAPLE) ×3 IMPLANT
STOCKINETTE IMPERVIOUS LG (DRAPES) ×3 IMPLANT
SUT ETHILON 3 0 PS 1 (SUTURE) IMPLANT
SUT SILK 0 TIES 10X30 (SUTURE) ×3 IMPLANT
SUT SILK 2 0 (SUTURE) ×3
SUT SILK 2-0 18XBRD TIE 12 (SUTURE) ×1 IMPLANT
SUT SILK 3 0 (SUTURE)
SUT SILK 3-0 18XBRD TIE 12 (SUTURE) IMPLANT
SUT VIC AB 2-0 CT1 18 (SUTURE) ×6 IMPLANT
TOWEL GREEN STERILE (TOWEL DISPOSABLE) ×6 IMPLANT
UNDERPAD 30X30 (UNDERPADS AND DIAPERS) ×3 IMPLANT
WATER STERILE IRR 1000ML POUR (IV SOLUTION) ×3 IMPLANT

## 2017-03-15 NOTE — Transfer of Care (Signed)
Immediate Anesthesia Transfer of Care Note  Patient: Leonard [name]  Procedure(s) Performed: AMPUTATION ABOVE KNEE (Left Leg Upper)  Patient Location: PACU  Anesthesia Type:General  Level of Consciousness: awake, alert  and oriented  Airway & Oxygen Therapy: Patient connected to nasal cannula oxygen  Post-op Assessment: Post -op Vital signs reviewed and stable  Post vital signs: stable  Last Vitals:  Vitals:   03/15/17 0025 03/15/17 0524  BP: (!) 134/53 (!) 135/55  Pulse: 71 68  Resp: 18 14  Temp: 36.9 C 36.9 C  SpO2: 96% 99%    Last Pain:  Vitals:   03/15/17 0524  TempSrc: Oral  PainSc:          Complications: No apparent anesthesia complications

## 2017-03-15 NOTE — Anesthesia Postprocedure Evaluation (Signed)
Anesthesia Post Note  Patient: Leonard [name]  Procedure(s) Performed: AMPUTATION ABOVE KNEE (Left Leg Upper)     Patient location during evaluation: PACU Anesthesia Type: General Level of consciousness: awake Pain management: pain level controlled Vital Signs Assessment: post-procedure vital signs reviewed and stable Respiratory status: spontaneous breathing Cardiovascular status: stable Anesthetic complications: no    Last Vitals:  Vitals:   03/15/17 1003 03/15/17 1018  BP: (!) 140/57 (!) 135/55  Pulse: 62 61  Resp: (!) 24 20  Temp: 36.4 C 36.4 C  SpO2: 100% 100%    Last Pain:  Vitals:   03/15/17 0848  TempSrc: Temporal  PainSc: 9                  Lorree Millar

## 2017-03-15 NOTE — Anesthesia Procedure Notes (Signed)
Procedure Name: Intubation Date/Time: 03/15/2017 7:35 AM Performed by: Lavell Luster, CRNA Pre-anesthesia Checklist: Patient identified, Emergency Drugs available, Suction available, Patient being monitored and Timeout performed Patient Re-evaluated:Patient Re-evaluated prior to induction Oxygen Delivery Method: Circle system utilized Preoxygenation: Pre-oxygenation with 100% oxygen Induction Type: IV induction Ventilation: Mask ventilation without difficulty Laryngoscope Size: Mac and 4 Grade View: Grade II Tube type: Oral Tube size: 7.5 mm Number of attempts: 1 Airway Equipment and Method: Stylet Placement Confirmation: ETT inserted through vocal cords under direct vision,  positive ETCO2 and breath sounds checked- equal and bilateral Secured at: 22 cm Tube secured with: Tape Dental Injury: Teeth and Oropharynx as per pre-operative assessment

## 2017-03-15 NOTE — NC FL2 (Signed)
[place] MEDICAID FL2 LEVEL OF CARE SCREENING TOOL     IDENTIFICATION  [name] Birthdate: [birth year] Sex: male Admission Date (Current Location): 03/12/2017  [facility] and [place] Number:  Herbalist and Address:  [facility] [place] [address] [place], [place] [address]      Provider Number: [id number]  Attending Physician Name and Address:  Conrad Eldon, MD  Relative Name and Phone Number:       Current Level of Care: Hospital Recommended Level of Care: [place] Prior Approval Number:    Date Approved/Denied:   PASRR Number: [id number]  Discharge Plan: SNF    Current Diagnoses: Patient Active Problem List   Diagnosis Date Noted  . Atherosclerosis of aorta with gangrene ([place]) 03/12/2017  . Atherosclerotic peripheral vascular disease with gangrene ([place]) 02/14/2017  . Encounter for vitamin deficiency screening 02/11/2017  . Collapse of left talus 01/14/2017  . Diabetic polyneuropathy associated with type 2 diabetes mellitus ([place]) 01/14/2017  . 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 Idiopathic chronic gout of left foot without tophus   . Left foot pain 10/29/2016  . Long term (current) use of anticoagulants [Z79.01] 09/09/2016  . Chronic deep vein thrombosis (DVT) of femoral vein of left lower extremity ([place]) 09/03/2016  . Morbid obesity due to excess calories ([place]) 03/17/2016  . Anemia due to stage 4 chronic kidney disease treated with darbepoetin ([place]) 02/28/2016  . Fatigue associated with anemia 01/21/2016  . Diabetic retinopathy ([place]) 11/13/2015  . CHF (congestive heart failure) ([place]) 08/27/2015  . Iron deficiency anemia 08/03/2015  . Claudication ([place])   . Diabetes ([place])   . Hematuria 06/27/2015  . Skin lesion of face 02/22/2015  . 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 Deficiency anemia 01/15/2015  . CKD (chronic kidney disease) stage  4, GFR 15-29 ml/min (HCC) 01/15/2015  . Hypothyroidism 11/22/2014  . COPD GOLD III  02/16/2014  . History of tobacco use 08/23/2013  . 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 CAD (coronary artery disease) 04/06/2013  . PVD - hx of Rt SFA PTA and s/p Rt 4-5th toe amp Dec 2014 04/05/2013  . Essential hypertension 11/25/2012  . Mixed hyperlipidemia 07/17/2008    Orientation RESPIRATION BLADDER Height & Weight     Self, Place, Situation  Normal Incontinent, External catheter Weight: 179 lb 10.8 oz (81.5 kg) Height:  5\' 8"  (172.7 cm)  BEHAVIORAL SYMPTOMS/MOOD NEUROLOGICAL BOWEL NUTRITION STATUS      Continent Diet(see DC summary)  AMBULATORY STATUS COMMUNICATION OF NEEDS Skin   Extensive Assist Verbally Surgical wounds(left AKA site- compression wrap)                       Personal Care Assistance Level of Assistance  Bathing, Dressing Bathing Assistance: Maximum assistance   Dressing Assistance: Maximum assistance     Functional Limitations Info             SPECIAL CARE FACTORS FREQUENCY  PT (By licensed PT), OT (By licensed OT)     PT Frequency: 5/wk OT Frequency: 5/wk            Contractures      Additional Factors Info  Code Status, Allergies, Insulin Sliding Scale Code Status Info: FULL Allergies Info: NKA   Insulin Sliding Scale Info: 3/day       Current Medications (03/15/2017):  This is the current hospital active medication list  Current Facility-Administered Medications  Medication Dose Route Frequency Provider Last Rate Last Dose  . 0.9 %  sodium chloride infusion  250 mL Intravenous PRN Conrad [place], MD      . 0.9 %  sodium chloride infusion   Intravenous Once Conrad [place], MD      . acetaminophen (TYLENOL) tablet 650 mg  650 mg Oral Q4H PRN Conrad Renick, MD      . allopurinol (ZYLOPRIM) tablet 100 mg  100 mg Oral Daily Conrad [place], MD   100 mg at 03/14/17 0848  . amLODipine (NORVASC) tablet 10 mg  10 mg Oral Daily Conrad [place], MD   10 mg at  03/14/17 0847  . atorvastatin (LIPITOR) tablet 40 mg  40 mg Oral q1800 Conrad Howard, MD   40 mg at 03/14/17 1821  . carvedilol (COREG) tablet 50 mg  50 mg Oral BID WC Conrad Alpine, MD   50 mg at 03/15/17 1146  . cloNIDine (CATAPRES) tablet 0.2 mg  0.2 mg Oral BID Conrad [place], MD   0.2 mg at 03/14/17 2215  . doxazosin (CARDURA) tablet 8 mg  8 mg Oral QPM Conrad [place], MD   8 mg at 03/14/17 1822  . heparin injection 5,000 Units  5,000 Units Subcutaneous Q8H Conrad Montmore[place], MD   5,000 Units at 03/15/17 [phone]  . hydrALAZINE (APRESOLINE) injection 5 mg  5 mg Intravenous Q20 Min PRN Conrad [place], MD      . hydrALAZINE (APRESOLINE) tablet 100 mg  100 mg Oral BID Conrad [place], MD   100 mg at 03/14/17 2215  . insulin aspart (novoLOG) injection 0-15 Units  0-15 Units Subcutaneous TID WC Conrad [place], MD   5 Units at 03/15/17 1146  . labetalol (NORMODYNE,TRANDATE) injection 10 mg  10 mg Intravenous Q10 min PRN Conrad [place], MD      . morphine 2 MG/ML injection 2 mg  2 mg Intravenous Q3H PRN Conrad [place], MD   2 mg at 03/14/17 0906  . ondansetron (ZOFRAN) injection 4 mg  4 mg Intravenous Q6H PRN Conrad [address], MD   4 mg at 03/15/17 6720  . oxyCODONE (Oxy IR/ROXICODONE) immediate release tablet 5-10 mg  5-10 mg Oral Q4H PRN Conrad [place], MD   10 mg at 03/15/17 1146  . piperacillin-tazobactam (ZOSYN) IVPB 3.375 g  3.375 g Intravenous Q8H Skeet Simmer, [facility]   Stopped at 03/15/17 9470  . sodium chloride flush (NS) 0.9 % injection 3 mL  3 mL Intravenous Q12H Conrad McGehee, MD   3 mL at 03/14/17 2216  . sodium chloride flush (NS) 0.9 % injection 3 mL  3 mL Intravenous PRN Conrad , MD      . vancomycin (VANCOCIN) IVPB 750 mg/150 ml premix  750 mg Intravenous Q24H Skeet Simmer, [facility]   Stopped at 03/14/17 1114   Facility-Administered Medications Ordered in Other Encounters  Medication Dose Route Frequency Provider Last Rate Last Dose  . sodium chloride flush (NS) 0.9 % injection 10 mL  10 mL  Intracatheter PRN Heath Lark, MD         Discharge Medications: Please see discharge summary for a list of discharge medications.  Relevant Imaging Results:  Relevant Lab Results:   Additional Information SS#: [id number]  Jorge Ny, LCSW

## 2017-03-15 NOTE — Op Note (Signed)
    [name] MRN: [id number] DOB: 03-20-1950 Sex: male  03/15/2017 Pre-operative Diagnosis: necrotic left foot Post-operative diagnosis:  Same Surgeon:  Erlene Quan C. Donzetta Matters, MD Assistant: Gerri Lins, PA Procedure Performed: Left above knee amputation  Indications: 67 year old male with progressive dry necrosis of his left foot.  He is undergone angiogram demonstrates no vascular reconstruction options.  Family has elected to proceed with above-knee amputation.  Findings: The patient had adequate bleeding from both the SFA and surrounding tissues.  I completion there was a floppy above-knee amputation.    Procedure:  The patient was identified in the holding area and taken to the operating room where he was placed supine on the operating table and general anesthesia was induced.  He was sterilely prepped and draped in the left lower extremity given antibiotics and timeout called.  We began with a fishmouth type incision just above the knee.  Dissected down through the subcutaneous tissue and the muscle with electrocautery to the level of the bone.  Periosteum was elevated and the bone was transected with a Gigli saw.  The rotation knife was used to create a posterior flap.  Blood vessels were clamped and suture-ligated.  The wound was irrigated and hemostasis obtained.  We smoothed the bone with a rasp.  Flaps were then approximated with 2-0 Vicryl suture in interrupted fashion.  Skin was then reapproximated with staples.  A dry dressing was placed.  Patient tolerated procedure well without immediate complication.  All counts were correct at completion.  Next  EBL 500 cc.   Brandon C. Donzetta Matters, MD [facility] Office: [phone] Pager: [phone]

## 2017-03-15 NOTE — Clinical Social Work Note (Signed)
Clinical Social Work Assessment  Patient Details  Name: Leonard [name] MRN: [id number] Date of Birth: 01/08/1951  Date of referral:  03/15/17               Reason for consult:  Facility Placement                Permission sought to share information with:  Facility Art therapist granted to share information::  Yes, Verbal Permission Granted  Name::        Agency::  SNF  Relationship::  nephew  Contact Information:     Housing/Transportation Living arrangements for the past 2 months:  Apartment Source of Information:  Patient Patient Interpreter Needed:  None Criminal Activity/Legal Involvement Pertinent to Current Situation/Hospitalization:  No - Comment as needed Significant Relationships:  Siblings, Other Family Members Lives with:  Siblings Do you feel safe going back to the place where you live?  No Need for family participation in patient care:  No (Coment)  Care giving concerns:  Pt lives at home with sister who cannot provide current level of assistance needed.   Social Worker assessment / plan:  CSW spoke with pt and pt nephew at bedside concerning DC planning.  Pt alert and oriented after surgery but somewhat lethargic.  CSW spoke with pt about likely need for increased assistance following amputation and medical team belief that pt would benefit from SNF stay after discharged.  Pt reports never having been to SNF- CSW explained SNF set up and referral process.  Employment status:  Retired Forensic scientist:  Medicare PT Recommendations:  [place] / Referral to community resources:  [place]  Patient/Family's Response to care:  Pt believes SNF would be the best option for him at this time and is agreeable to go for rehab to attempt to regain as much independence as possible.  Patient/Family's Understanding of and Emotional Response to Diagnosis, Current Treatment, and Prognosis:  Pt appeared to be  tired yet motivated to make the right decision and go into rehab to give him the best chance of making a meaningful recovery.  Emotional Assessment Appearance:  Appears stated age Attitude/Demeanor/Rapport:  Lethargic Affect (typically observed):  Appropriate Orientation:  Oriented to Self, Oriented to Place, Oriented to Situation Alcohol / Substance use:  Not Applicable Psych involvement (Current and /or in the community):  No (Comment)  Discharge Needs  Concerns to be addressed:  Care Coordination Readmission within the last 30 days:  No Current discharge risk:  Physical Impairment Barriers to Discharge:  Continued Medical Work up   Leonard Ny, LCSW 03/15/2017, 1:07 PM

## 2017-03-15 NOTE — Progress Notes (Signed)
  Progress Note    03/15/2017 7:24 AM Day of Surgery  Subjective:  No new complaints  Vitals:   03/15/17 0025 03/15/17 0524  BP: (!) 134/53 (!) 135/55  Pulse: 71 68  Resp: 18 14  Temp: 98.5 F (36.9 C) 98.4 F (36.9 C)  SpO2: 96% 99%    Physical Exam: Awake and alert Non labored respirations Left leg dressing cdi, foul smelling  CBC    Component Value Date/Time   WBC 10.6 (H) 03/15/2017 0406   RBC 3.35 (L) 03/15/2017 0406   HGB 7.2 (L) 03/15/2017 0406   HGB 8.0 (L) 02/11/2017 1220   HGB 7.3 (L) 01/30/2017 1223   HCT 23.6 (L) 03/15/2017 0406   HCT 26.4 (L) 02/11/2017 1220   HCT 24.1 (L) 01/30/2017 1223   PLT 327 03/15/2017 0406   PLT 368 02/11/2017 1220   MCV 70.4 (L) 03/15/2017 0406   MCV 73 (L) 02/11/2017 1220   MCV 77.0 (L) 01/30/2017 1223   MCH 21.5 (L) 03/15/2017 0406   MCHC 30.5 03/15/2017 0406   RDW 19.8 (H) 03/15/2017 0406   RDW 19.2 (H) 02/11/2017 1220   RDW 19.9 (H) 01/30/2017 1223   LYMPHSABS 0.8 (L) 02/27/2017 1123   LYMPHSABS 0.8 (L) 01/30/2017 1223   MONOABS 0.6 02/27/2017 1123   MONOABS 0.4 01/30/2017 1223   EOSABS 0.1 02/27/2017 1123   EOSABS 0.1 01/30/2017 1223   EOSABS 0.2 12/29/2016 1547   BASOSABS 0.0 02/27/2017 1123   BASOSABS 0.0 01/30/2017 1223    BMET    Component Value Date/Time   NA 131 (L) 03/15/2017 0406   NA 139 02/11/2017 1220   NA 140 08/16/2015 1554   K 4.4 03/15/2017 0406   K 3.8 08/16/2015 1554   CL 100 (L) 03/15/2017 0406   CO2 19 (L) 03/15/2017 0406   CO2 21 (L) 08/16/2015 1554   GLUCOSE 219 (H) 03/15/2017 0406   GLUCOSE 133 08/16/2015 1554   BUN 66 (H) 03/15/2017 0406   BUN 54 (H) 02/11/2017 1220   BUN 51.0 (H) 08/16/2015 1554   CREATININE 2.79 (H) 03/15/2017 0406   CREATININE 3.64 (H) 01/09/2016 1606   CREATININE 2.7 (H) 08/16/2015 1554   CALCIUM 8.2 (L) 03/15/2017 0406   CALCIUM NOPLAS 10/24/2015 1415   CALCIUM 8.9 08/16/2015 1554   GFRNONAA 22 (L) 03/15/2017 0406   GFRNONAA 27 (L) 07/20/2015 0949   GFRAA 26 (L) 03/15/2017 0406   GFRAA 31 (L) 07/20/2015 0949    INR    Component Value Date/Time   INR 1.97 03/15/2017 0406     Intake/Output Summary (Last 24 hours) at 03/15/2017 0724 Last data filed at 03/14/2017 2054 Gross per 24 hour  Intake -  Output 675 ml  Net -675 ml     Assessment:  67 y.o. male is here with gangrenous left foot, no vascular reconstruction options Plan: OR today for left aka. I have spoken extensively with patient and his family about the risks and alternatives of palliative care and they want to proceed with left aka.   Ryelle Ruvalcaba C. Donzetta Matters, MD [facility] [place] [place] Office: [phone] Pager: [phone]  03/15/2017 7:24 AM

## 2017-03-15 NOTE — Progress Notes (Signed)
1 st unit of PRBC's started by CRNA at [phone]- -rapid rate/see OR record - 2nd unit to be infused on floor - see VS record - no adverse reactions noted - infusion completed at 0955- no blood consent on chart

## 2017-03-15 NOTE — Anesthesia Preprocedure Evaluation (Addendum)
Anesthesia Evaluation  Patient identified by MRN, date of birth, ID band Patient awake    Reviewed: Allergy & Precautions, NPO status , Patient's Chart, lab work & pertinent test results  Airway Mallampati: II  TM Distance: >3 FB     Dental  (+) Dental Advisory Given, Edentulous Upper, Edentulous Lower   Pulmonary shortness of breath, COPD, former smoker,    breath sounds clear to auscultation       Cardiovascular hypertension, + CAD, + Peripheral Vascular Disease and +CHF   Rhythm:Regular Rate:Normal     Neuro/Psych    GI/Hepatic negative GI ROS, Neg liver ROS,   Endo/Other  diabetesHypothyroidism   Renal/GU Renal disease     Musculoskeletal   Abdominal   Peds  Hematology  (+) anemia ,   Anesthesia Other Findings   Reproductive/Obstetrics                            Anesthesia Physical Anesthesia Plan  ASA: III  Anesthesia Plan: General   Post-op Pain Management:    Induction: Intravenous  PONV Risk Score and Plan: 2 and Treatment may vary due to age or medical condition  Airway Management Planned: Oral ETT  Additional Equipment:   Intra-op Plan:   Post-operative Plan:   Informed Consent: I have reviewed the patients History and Physical, chart, labs and discussed the procedure including the risks, benefits and alternatives for the proposed anesthesia with the patient or authorized representative who has indicated his/her understanding and acceptance.   Dental advisory given  Plan Discussed with: CRNA and Anesthesiologist  Anesthesia Plan Comments:         Anesthesia Quick Evaluation

## 2017-03-16 ENCOUNTER — Ambulatory Visit (INDEPENDENT_AMBULATORY_CARE_PROVIDER_SITE_OTHER): Payer: Medicare Other | Admitting: Orthopedic Surgery

## 2017-03-16 ENCOUNTER — Encounter (HOSPITAL_COMMUNITY): Payer: Self-pay | Admitting: Vascular Surgery

## 2017-03-16 DIAGNOSIS — Z85828 Personal history of other malignant neoplasm of skin: Secondary | ICD-10-CM | POA: Diagnosis not present

## 2017-03-16 DIAGNOSIS — E1152 Type 2 diabetes mellitus with diabetic peripheral angiopathy with gangrene: Secondary | ICD-10-CM | POA: Diagnosis not present

## 2017-03-16 DIAGNOSIS — I700 Atherosclerosis of aorta: Secondary | ICD-10-CM | POA: Diagnosis not present

## 2017-03-16 DIAGNOSIS — I130 Hypertensive heart and chronic kidney disease with heart failure and stage 1 through stage 4 chronic kidney disease, or unspecified chronic kidney disease: Secondary | ICD-10-CM | POA: Diagnosis not present

## 2017-03-16 DIAGNOSIS — N184 Chronic kidney disease, stage 4 (severe): Secondary | ICD-10-CM | POA: Diagnosis not present

## 2017-03-16 DIAGNOSIS — I96 Gangrene, not elsewhere classified: Secondary | ICD-10-CM | POA: Diagnosis not present

## 2017-03-16 LAB — BPAM RBC
Blood Product Expiration Date: 201902282359
Blood Product Expiration Date: 201903022359
ISSUE DATE / TIME: 201902100825
ISSUE DATE / TIME: 201902101108
UNIT TYPE AND RH: 1700
Unit Type and Rh: 1700

## 2017-03-16 LAB — CBC WITH DIFFERENTIAL/PLATELET
BASOS ABS: 0 10*3/uL (ref 0.0–0.1)
Basophils Relative: 0 %
EOS PCT: 0 %
Eosinophils Absolute: 0 10*3/uL (ref 0.0–0.7)
HCT: 26.9 % — ABNORMAL LOW (ref 39.0–52.0)
Hemoglobin: 8.4 g/dL — ABNORMAL LOW (ref 13.0–17.0)
LYMPHS PCT: 7 %
Lymphs Abs: 1 10*3/uL (ref 0.7–4.0)
MCH: 22.5 pg — AB (ref 26.0–34.0)
MCHC: 31.2 g/dL (ref 30.0–36.0)
MCV: 72.1 fL — AB (ref 78.0–100.0)
MONO ABS: 0.2 10*3/uL (ref 0.1–1.0)
MONOS PCT: 2 %
Neutro Abs: 12.6 10*3/uL — ABNORMAL HIGH (ref 1.7–7.7)
Neutrophils Relative %: 91 %
PLATELETS: 325 10*3/uL (ref 150–400)
RBC: 3.73 MIL/uL — ABNORMAL LOW (ref 4.22–5.81)
RDW: 19 % — AB (ref 11.5–15.5)
WBC: 13.8 10*3/uL — ABNORMAL HIGH (ref 4.0–10.5)

## 2017-03-16 LAB — TYPE AND SCREEN
ABO/RH(D): B NEG
ANTIBODY SCREEN: NEGATIVE
Unit division: 0
Unit division: 0

## 2017-03-16 LAB — GLUCOSE, CAPILLARY
GLUCOSE-CAPILLARY: 257 mg/dL — AB (ref 65–99)
Glucose-Capillary: 254 mg/dL — ABNORMAL HIGH (ref 65–99)
Glucose-Capillary: 247 mg/dL — ABNORMAL HIGH (ref 65–99)
Glucose-Capillary: 196 mg/dL — ABNORMAL HIGH (ref 65–99)

## 2017-03-16 MED ORDER — MAGNESIUM HYDROXIDE 400 MG/5ML PO SUSP: 30.0000 mL | Freq: Every day | ORAL | Status: DC | PRN

## 2017-03-16 NOTE — Progress Notes (Addendum)
[facility]  Subjective  - Doing OK over.  Dressing was saturated with urine and blood.   Objective 140/60 77 98.4 F (36.9 C) (Oral) (!) 23 100%  Intake/Output Summary (Last 24 hours) at 03/16/2017 0730 Last data filed at 03/15/2017 1349 Gross per 24 hour  Intake 1945 ml  Output 525 ml  Net 1420 ml    Left stump healing well incision without active bleeding Dry dressing reapplied patient tolerated this well Heart RRR Lungs non labored breathing Gen NAD  Assessment/Planning: POD # 1 Left AKA   Stump appears viable. Will have nurse place condom cath to keep dressing clean and dry. Pending PT/OT SNF verses CIR.    Roxy Horseman 03/16/2017 7:30 AM --  Laboratory Lab Results: Recent Labs    03/15/17 0406 03/16/17 0345  WBC 10.6* 13.8*  HGB 7.2* 8.4*  HCT 23.6* 26.9*  PLT 327 325   BMET Recent Labs    03/15/17 0406  NA 131*  K 4.4  CL 100*  CO2 19*  GLUCOSE 219*  BUN 66*  CREATININE 2.79*  CALCIUM 8.2*    COAG Lab Results  Component Value Date   INR 1.97 03/15/2017   INR 2.29 03/12/2017   INR 3.4 03/06/2017   No results found for: PTT   I have independently interviewed and examined patient and agree with PA assessment and plan above.   Jewelia Bocchino C. Donzetta Matters, MD [facility] Office: [phone] Pager: [phone]

## 2017-03-16 NOTE — Progress Notes (Signed)
Pharmacy Antibiotic Note  Leonard [name] is a 67 y.o. male starting on Vancomycin and Zosyn for bacteremia coverage with left foot gangrene. S/p aortogram on 2/7.  Treatment options being discussed. Afebrile, WBC up to 13.8. No cultures. SCr down to 2.79 on 2/10. Vancomycin dose missed 2/10 while in OR.  Plan: Vancomycin 750 mg IV q24hrs Zosyn 3.375 gm IV q8hrs (each over 4 hours) Monitor clinical progress, c/s, renal function F/u de-escalation plan/LOT, vancomycin trough as indicated F/u LOT post-amputation - d/c soon?   Height: 5\' 8"  (172.7 cm) Weight: 168 lb 3.4 oz (76.3 kg) IBW/kg (Calculated) : 68.4  Temp (24hrs), Avg:98.2 F (36.8 C), Min:97.6 F (36.4 C), Max:98.6 F (37 C)  Recent Labs  Lab 03/12/17 1242 03/12/17 2129 03/13/17 0339 03/15/17 0406 03/16/17 0345  WBC  --  11.0*  --  10.6* 13.8*  CREATININE 3.00* 2.90* 2.76* 2.79*  --     Estimated Creatinine Clearance: 25.5 mL/min (A) (by C-G formula based on SCr of 2.79 mg/dL (H)).    No Known Allergies  Antimicrobials this admission: Cefazolin 2 gm X 1 on 2/7 prior to aortogram Vanc 2/8>> Zosyn 2/8>>  Dose adjustments this admission: n/a  Microbiology results: 2/7 MRSA PCR - negative  Elicia Lamp, PharmD, BCPS Clinical Pharmacist Clinical phone for 03/16/2017 until 3:30pm: [phone] If after 3:30pm, please call main pharmacy at: [phone] 03/16/2017 1:15 PM

## 2017-03-16 NOTE — Telephone Encounter (Signed)
Form completed and placed in "to be faxed" pile

## 2017-03-17 DIAGNOSIS — E1152 Type 2 diabetes mellitus with diabetic peripheral angiopathy with gangrene: Secondary | ICD-10-CM | POA: Diagnosis not present

## 2017-03-17 DIAGNOSIS — I700 Atherosclerosis of aorta: Secondary | ICD-10-CM | POA: Diagnosis not present

## 2017-03-17 DIAGNOSIS — I130 Hypertensive heart and chronic kidney disease with heart failure and stage 1 through stage 4 chronic kidney disease, or unspecified chronic kidney disease: Secondary | ICD-10-CM | POA: Diagnosis not present

## 2017-03-17 DIAGNOSIS — Z85828 Personal history of other malignant neoplasm of skin: Secondary | ICD-10-CM | POA: Diagnosis not present

## 2017-03-17 DIAGNOSIS — I96 Gangrene, not elsewhere classified: Secondary | ICD-10-CM | POA: Diagnosis not present

## 2017-03-17 DIAGNOSIS — N184 Chronic kidney disease, stage 4 (severe): Secondary | ICD-10-CM | POA: Diagnosis not present

## 2017-03-17 LAB — BASIC METABOLIC PANEL
Anion gap: 15 (ref 5–15)
BUN: 57 mg/dL — AB (ref 6–20)
CALCIUM: 8.1 mg/dL — AB (ref 8.9–10.3)
CO2: 17 mmol/L — ABNORMAL LOW (ref 22–32)
CREATININE: 2.56 mg/dL — AB (ref 0.61–1.24)
Chloride: 102 mmol/L (ref 101–111)
GFR, EST AFRICAN AMERICAN: 29 mL/min — AB (ref >60)
GFR, EST NON AFRICAN AMERICAN: 25 mL/min — AB (ref >60)
Glucose, Bld: 271 mg/dL — ABNORMAL HIGH (ref 65–99)
Potassium: 4.4 mmol/L (ref 3.5–5.1)
SODIUM: 134 mmol/L — AB (ref 135–145)

## 2017-03-17 LAB — GLUCOSE, CAPILLARY
Glucose-Capillary: 231 mg/dL — ABNORMAL HIGH (ref 65–99)
Glucose-Capillary: 241 mg/dL — ABNORMAL HIGH (ref 65–99)
Glucose-Capillary: 220 mg/dL — ABNORMAL HIGH (ref 65–99)
Glucose-Capillary: 180 mg/dL — ABNORMAL HIGH (ref 65–99)

## 2017-03-17 MED ORDER — OXYCODONE HCL 5 MG PO TABS: 5.0000 mg | ORAL_TABLET | Freq: Four times a day (QID) | ORAL | 0 refills | Status: DC | PRN

## 2017-03-17 MED ORDER — ENOXAPARIN SODIUM 80 MG/0.8ML ~~LOC~~ SOLN: 80.0000 mg | SUBCUTANEOUS | 0 refills | Status: DC

## 2017-03-17 NOTE — Care Management Important Message (Signed)
Important Message  Patient Details  Name: Leonard [name] MRN: [id number] Date of Birth: Nov 20, 1950   Medicare Important Message Given:  Yes    Orbie Pyo 03/17/2017, 11:17 AM

## 2017-03-17 NOTE — Telephone Encounter (Signed)
Per Dr. Juanito Doom these forms have been faxed back.Leonard [name], [name], [name]

## 2017-03-17 NOTE — Consult Note (Signed)
   [facility] CM Inpatient Consult   03/17/2017  Leonard [name] 28-Nov-1950 [id number]   Chart reviewed for multiple ED visits and 2 admissions in the Medicare ACO.Marland Kitchen  Patient has had a left amputation above the knee.  Patient is listed to discharge to [facility] for skilled rehab.  Patient remains lethargic at time according to PT notes and unable to stand.  Natividad Brood, RN BSN [name] Liaison  [phone] business mobile phone Toll free office [phone]

## 2017-03-17 NOTE — Progress Notes (Signed)
Patient not being discharged today, see SW note. Leonard [name] [facility] made aware. Patient placed back on telemetry and Per PA ok to leave IV out. Will monitor patient .Jazzmin Newbold, Bettina Gavia RN

## 2017-03-17 NOTE — Discharge Summary (Addendum)
[facility] Discharge Summary   Patient ID:  Leonard [name] MRN: [id number] DOB/AGE: 06-27-1950 67 y.o.  Admit date: 03/12/2017 Discharge date: 03/18/2017 Date of Surgery: 03/15/2017 Surgeon: Surgeon(s): Waynetta Sandy, MD  Admission Diagnosis: Atherosclerosis of aorta with gangrene ([place]) [I70.0, I96]  Discharge Diagnoses:  Atherosclerosis of aorta with gangrene ([place]) [I70.0, I96]  Secondary Diagnoses: Past Medical History:  Diagnosis Date  . Cancer ([place])    skin cancer-  . CHF (congestive heart failure) ([place])   . Chronic kidney disease    not on dialysis  . Coronary artery disease   . Gangrene of toe ([place])    right 5th/notes 01/04/2013   . High cholesterol   . Hypertension   . Iron deficiency anemia 08/03/2015  . 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 PAD (peripheral artery disease) ([place])   . Shortness of breath dyspnea    with exertion  . Type II diabetes mellitus ([facility])    Type II    Procedure(s): AMPUTATION ABOVE KNEE  Discharged Condition: stable  HPI:  67 y/o male with Known PAD B LE.   In May/June some darkening of the left foot was noted but was felt to be more gout symptoms.  Over the last 2 weeks, family notes marked change in the left foot.  Initially he had gangrenous changes to the L 3rd toe.  This has progressed and now he has drainage from the foot. He was followed by DR. Duda for wound care of the left foot.  Angiogram performed 03/12/2017 by Dr. Bridgett Larsson revealed extensive collaterals and occluded tibial arteries.  He had no revascularization options.  Dr. Donzetta Matters spoke to the family and it was decided that the best treatment was left LE AKA.    Hospital Course:  Leonard [name] is a 67 y.o. male is S/P Left Procedure(s): AMPUTATION ABOVE KNEE  HBG pre op was 7.2 transfused 2 units PRBC.  Known iron deficiency anemia and CKD.  Left AKA stump viable and healing well. PT/OT for mobility.  Antibiotics  were continue IV vancomycin and Zosyn for 24 hours post op.  Plan to discharge to SNF and f/u in our office in 4 weeks for staple removal.  Disposition stable.  Known tibial disease in the right LE will need future ABI's.  This will be scheduled at his post op visit.  Pending insurance he will be discharged to SNF.     Consults:  Treatment Team:  Waynetta Sandy, MD  Significant Diagnostic Studies: CBC Lab Results  Component Value Date   WBC 13.8 (H) 03/16/2017   HGB 8.4 (L) 03/16/2017   HCT 26.9 (L) 03/16/2017   MCV 72.1 (L) 03/16/2017   PLT 325 03/16/2017    BMET    Component Value Date/Time   NA 134 (L) 03/17/2017 0312   NA 139 02/11/2017 1220   NA 140 08/16/2015 1554   K 4.4 03/17/2017 0312   K 3.8 08/16/2015 1554   CL 102 03/17/2017 0312   CO2 17 (L) 03/17/2017 0312   CO2 21 (L) 08/16/2015 1554   GLUCOSE 271 (H) 03/17/2017 0312   GLUCOSE 133 08/16/2015 1554   BUN 57 (H) 03/17/2017 0312   BUN 54 (H) 02/11/2017 1220   BUN 51.0 (H) 08/16/2015 1554   CREATININE 2.56 (H) 03/17/2017 0312   CREATININE 3.64 (H) 01/09/2016 1606   CREATININE 2.7 (H) 08/16/2015 1554   CALCIUM 8.1 (L) 03/17/2017 0312   CALCIUM NOPLAS 10/24/2015 1415  CALCIUM 8.9 08/16/2015 1554   GFRNONAA 25 (L) 03/17/2017 0312   GFRNONAA 27 (L) 07/20/2015 0949   GFRAA 29 (L) 03/17/2017 0312   GFRAA 31 (L) 07/20/2015 0949   COAG Lab Results  Component Value Date   INR 2.62 03/18/2017   INR 1.97 03/15/2017   INR 2.29 03/12/2017     Disposition:  Discharge to :Skilled nursing facility Discharge Instructions    Call MD for:  redness, tenderness, or signs of infection (pain, swelling, bleeding, redness, odor or green/yellow discharge around incision site)   Complete by:  As directed    Call MD for:  severe or increased pain, loss or decreased feeling  in affected limb(s)   Complete by:  As directed    Call MD for:  temperature >100.5   Complete by:  As directed    Resume previous diet    Complete by:  As directed      Allergies as of 03/18/2017   No Known Allergies     Medication List    STOP taking these medications   enoxaparin 80 MG/0.8ML injection Commonly known as:  LOVENOX     TAKE these medications   allopurinol 100 MG tablet Commonly known as:  ZYLOPRIM Take 100 mg by mouth daily.   amLODipine 10 MG tablet Commonly known as:  NORVASC Take 1 tablet (10 mg total) by mouth daily.   atorvastatin 40 MG tablet Commonly known as:  LIPITOR TAKE ONE (1) TABLET BY MOUTH EVERY DAY AT 6:00PM What changed:  See the new instructions.   carvedilol 25 MG tablet Commonly known as:  COREG TAKE TWO (2) TABLETS BY MOUTH 2 TIMES DAILY WITH A MEAL What changed:  See the new instructions.   cloNIDine 0.2 MG tablet Commonly known as:  CATAPRES TAKE TWO (2) TABLETS BY MOUTH 2 TIMES DAILY What changed:  See the new instructions.   doxazosin 8 MG tablet Commonly known as:  CARDURA Take 1 tablet (8 mg total) by mouth every evening. What changed:  when to take this   FEROSUL 325 (65 FE) MG tablet Generic drug:  ferrous sulfate TAKE ONE (1) TABLET BY MOUTH TWO (2) TIMES DAILY WITH A MEAL What changed:  See the new instructions.   glucose blood test strip Commonly known as:  ONETOUCH VERIO Use as instructed to test three times daily. ICD-10 code: E11.22   hydrALAZINE 100 MG tablet Commonly known as:  APRESOLINE TAKE ONE TABLET BY MOUTH EVERY EIGHT HOURS What changed:  See the new instructions.   insulin aspart 100 UNIT/ML FlexPen Commonly known as:  NOVOLOG FLEXPEN Inject 15 Units 2 (two) times daily before lunch and supper into the skin.   Insulin Glargine 100 UNIT/ML Solostar Pen Commonly known as:  LANTUS SOLOSTAR Inject 50 Units into the skin every morning. What changed:  when to take this   isosorbide mononitrate 60 MG 24 hr tablet Commonly known as:  IMDUR TAKE ONE (1) TABLET BY MOUTH EVERY DAY What changed:  See the new instructions.    levothyroxine 75 MCG tablet Commonly known as:  SYNTHROID, LEVOTHROID Take 1 tablet (75 mcg total) by mouth daily.   nitroGLYCERIN 0.4 MG SL tablet Commonly known as:  NITROSTAT Place 1 tablet (0.4 mg total) under the tongue every 5 (five) minutes as needed for chest pain.   ONETOUCH DELICA LANCETS 81W Misc 1 each by Does not apply route 3 (three) times daily. ICD-10 code: E11.22   EXHBZJIR CVELF FLEX SYSTEM w/Device Kit 1  kit by Does not apply route daily.   oxyCODONE 5 MG immediate release tablet Commonly known as:  Oxy IR/ROXICODONE Take 1 tablet (5 mg total) by mouth every 6 (six) hours as needed for moderate pain.   pantoprazole 40 MG tablet Commonly known as:  PROTONIX TAKE ONE (1) TABLET BY MOUTH EVERY DAY What changed:  See the new instructions.   potassium chloride SA 20 MEQ tablet Commonly known as:  K-DUR,KLOR-CON Take 20 mEq by mouth 2 (two) times daily.   torsemide 20 MG tablet Commonly known as:  DEMADEX Take 3 tablets (60 mg total) by mouth daily. May take an extra 20 mg Daily for weight gain above 212 lbs What changed:  additional instructions   traMADol 50 MG tablet Commonly known as:  ULTRAM Take 1 tablet (50 mg total) by mouth every 6 (six) hours as needed for moderate pain.   warfarin 5 MG tablet Commonly known as:  COUMADIN Take as directed. If you are unsure how to take this medication, talk to your nurse or doctor. Original instructions:  Take 2.5 mg (1/2 tablet) daily except 5 mg (1 tablet) on Monday and Friday What changed:    how much to take  how to take this  when to take this  additional instructions      Verbal and written Discharge instructions given to the patient. Wound care per Discharge AVS  Contact information for follow-up providers    Waynetta Sandy, MD Follow up in 4 week(s).   Specialties:  Vascular Surgery, Cardiology Contact information: [address] [place] [address] [phone]             Contact information for after-discharge care    Destination    [facility] .   Service:  Skilled Nursing Contact information: [address] [place] [phone]                  Signed: Roxy Horseman 03/18/2017, 8:37 AM

## 2017-03-17 NOTE — Progress Notes (Signed)
Physical Therapy Treatment Patient Details Name: Leonard [name] MRN: [id number] DOB: 09-10-1950 Today's Date: 03/17/2017    History of Present Illness Pt adm with lt foot gangrene. MD reports possible amputation vs palliative care. s/p L AKA 03/15/17.  PMH - PAD, HTN, CAD, rt toe amputations, chf, ckd, possible dementia    PT Comments    Pt performed bed mobility with decreased alertness.  RN reports giving pain meds pre tx.  Pt lethargic and required max VCs to maintain focus on task.  Pt remains to present with decreased strength and unable to achieve standing.  PTA opted for maximove from bed to chair to promote OOB mobility and improve sitting tolerance.  Pt presents with R knee flexion and external rotation of R hip.  Pt would benefit from [facility] to R foot to prevent contracture of R knee and heel cord.  Plan for SNF remains appropriate.    Follow Up Recommendations  SNF     Equipment Recommendations  None recommended by PT    Recommendations for Other Services       Precautions / Restrictions Precautions Precautions: Fall Restrictions Weight Bearing Restrictions: No Other Position/Activity Restrictions: No orders in chart but treated LLE residual limb as NWB.      Mobility  Bed Mobility Overal bed mobility: Needs Assistance Bed Mobility: Supine to Sit;Sit to Supine     Supine to sit: Max assist;Total assist     General bed mobility comments: Assist with multimodal cues to bring LEs to EOB and to elevate trunk, patient required total assistance for LE advancement to edge of bed and use of bed pad to advance hips to edge of bed.  Pt required max assistance to elevate trunk into sitting.  Once in sitting patient able to maintain trunk with B UE support.    Transfers Overall transfer level: Needs assistance Equipment used: (performed via maximove after several unsuccessful attempts to achieve standing with sara stedy.  ) Transfers: (maximove from bed to chair.  )            General transfer comment: Pt unable to stand, attempted to shift weight forward but unable to clear bottom to achieve standing despite use of stedy for UE use to pull into standing.  After innability to stand maximove lift pad placed in seated position for OOB mobility.    Ambulation/Gait           Gait velocity interpretation: Below normal speed for age/gender General Gait Details: Pt remains unable.     Stairs            Wheelchair Mobility    Modified Rankin (Stroke Patients Only)       Balance     Sitting balance-Leahy Scale: Poor Sitting balance - Comments: Pt able to sit edge of bed with B UEs supported.                                      Cognition Arousal/Alertness: Lethargic;Suspect due to medications Behavior During Therapy: Flat affect Overall Cognitive Status: Difficult to assess Area of Impairment: Attention;Following commands;Safety/judgement;Awareness;Problem solving                     Memory: Decreased recall of precautions Following Commands: Follows one step commands with increased time Safety/Judgement: Decreased awareness of safety;Decreased awareness of deficits   Problem Solving: Slow processing;Decreased initiation;Requires verbal cues;Requires tactile cues  Exercises      General Comments        Pertinent Vitals/Pain Pain Assessment: Faces Faces Pain Scale: Hurts even more Pain Location: L residual limb pain.   Pain Descriptors / Indicators: Grimacing;Sore Pain Intervention(s): Monitored during session;Repositioned    Home Living                      Prior Function            PT Goals (current goals can now be found in the care plan section) Acute Rehab PT Goals Patient Stated Goal: None stated.   Potential to Achieve Goals: Fair Progress towards PT goals: Progressing toward goals    Frequency    Min 2X/week      PT Plan Current plan remains appropriate     Co-evaluation              AM-PAC PT "6 Clicks" Daily Activity  Outcome Measure  Difficulty turning over in bed (including adjusting bedclothes, sheets and blankets)?: Unable Difficulty moving from lying on back to sitting on the side of the bed? : Unable Difficulty sitting down on and standing up from a chair with arms (e.g., wheelchair, bedside commode, etc,.)?: Unable Help needed moving to and from a bed to chair (including a wheelchair)?: Total Help needed walking in hospital room?: Total Help needed climbing 3-5 steps with a railing? : Total 6 Click Score: 6    End of Session Equipment Utilized During Treatment: Gait belt Activity Tolerance: Patient limited by fatigue;Patient limited by pain Patient left: in chair;with call bell/phone within reach;with chair alarm set Nurse Communication: Mobility status;Need for lift equipment(use of maximove for back to bed transfer.  ) PT Visit Diagnosis: Other abnormalities of gait and mobility (R26.89);Muscle weakness (generalized) (M62.81);Pain Pain - Right/Left: Left Pain - part of body: Ankle and joints of foot     Time: 0931-1010 PT Time Calculation (min) (ACUTE ONLY): 39 min  Charges:  $Therapeutic Activity: 38-52 mins                    G Codes:       Governor Rooks, PTA pager [phone]    Cristela Blue 03/17/2017, 10:30 AM

## 2017-03-17 NOTE — Care Management Note (Signed)
Case Management Note Marvetta Gibbons RN, BSN Unit 4E-Case Manager [phone]  Patient Details  Name: RAKIM MOONE MRN: [id number] Date of Birth: 1950/07/15  Subjective/Objective:   Pt admitted with PVD, progressive dry necrosis of left foot, s/p AKA on 2/10           Action/Plan: PTA pt lived at home, d/c planning for SNF, CSW following for placement needs-   Expected Discharge Date:  03/17/17               Expected Discharge Plan:  [place]  In-House Referral:  Clinical Social Work  Discharge planning Services  CM Consult, [place]  Post Acute Care Choice:    Choice offered to:     DME Arranged:    DME Agency:     HH Arranged:    [place] Agency:     Status of Service:  Completed, signed off  If discussed at [employer], dates discussed:   Discharge Disposition: skilled facility    Additional Comments:  03/17/17- 32- Tierre Gerard RN, CM- pt for d/c today- plan for [facility] per CSW.   Dawayne Patricia, RN 03/17/2017, 11:01 AM

## 2017-03-17 NOTE — Progress Notes (Addendum)
[facility] [place]  Subjective  - Doing OK over all.  Pain in stump when raised for dressing changes.   Objective 134/69 80 98.2 F (36.8 C) (Oral) 16 100%  Intake/Output Summary (Last 24 hours) at 03/17/2017 6314 Last data filed at 03/17/2017 0413 Gross per 24 hour  Intake 940 ml  Output 1425 ml  Net -485 ml    Left AKA healing very well.  No erythema or drainage. Heart RRR Lungs non labored breathing. Gen NAD  Assessment/Planning: POD # 2 Left AKA  Left stump viable and healing well. HGB 8.4 s/p pre-op transfusion 2 units iron anemia and CKD. Pending SNF F/U in our office with Dr. Donzetta Matters in 4 weeks from surgery for staple removal.   We will d/c antibiotics today post amputation   Roxy Horseman 03/17/2017 7:12 AM --  Laboratory Lab Results: Recent Labs    03/15/17 0406 03/16/17 0345  WBC 10.6* 13.8*  HGB 7.2* 8.4*  HCT 23.6* 26.9*  PLT 327 325   BMET Recent Labs    03/15/17 0406 03/17/17 0312  NA 131* 134*  K 4.4 4.4  CL 100* 102  CO2 19* 17*  GLUCOSE 219* 271*  BUN 66* 57*  CREATININE 2.79* 2.56*  CALCIUM 8.2* 8.1*    COAG Lab Results  Component Value Date   INR 1.97 03/15/2017   INR 2.29 03/12/2017   INR 3.4 03/06/2017   No results found for: PTT  I have independently interviewed and examined patient and agree with PA assessment and plan above.   Maciel Kegg C. Donzetta Matters, MD [facility] [place] Office: [phone] Pager: [phone]

## 2017-03-17 NOTE — Telephone Encounter (Signed)
[employer] lm on nurse line.  #'s 19 and 20 were not completed.  They are afxing back and this will need to be completed and faxed back before they can proceed. Yazmen Briones, Salome Spotted, CMA

## 2017-03-17 NOTE — Progress Notes (Signed)
CSW following patient for discharge needs. Patient was scheduled for discharge today and had a bed available at [facility]. Prior to pt discharge facility called and stated they will have to resend offer due to facility realizing that patient only has Medicare B and not part A. In pt facesheet it states that he has Medicare B and Medicaid as a secondary. CSW spoke with admissions coordinator at [facility] and asked if they would be able/willing to take patient under his secondary policy. [facility] stated they could take patient but family would have to pay out of pocket for patients stay at facility which would cost 265/day   CSW spoke with patients sister Leonard [name] via phone and explained to her what [facility] had stated. Leonard [name] stated they will be unable to pay out of pocket and expressed why "cant social worker" apply for medicare . CSW explained to [place] that hospital social worker do not have the ability to sign patients up for extra insurance. CSW also met a niece at patients bedside and explained to her the situation.CSW spoke to family and reiterated to them the five SNF that made an offer on patient and ask if they would be willing to go to any of those facilities if CSW is able to have facility take patient under Medicaid. Family stated they want a facility that has 5 stars and they were only willing to look into [facility] (they are full) and [facility] (family would have to pay out of pocket). CSW reached out to [facility] and [facility] and admissions coordinator stated they will be able to take patient today under Medicaid if they agree to have patient stay for 30 days or more.   CSW received a 3 way call from family and [employer] representative Leonard [name]). Family stated they found out that patient has [employer] and according to the representative from [place] that if patient has [employer] then he would have Medicare A and B. CSW explained to family that the SNF would need proof  of insurance card. CSW explained to family that it is there responsibility to know and find the insurance card and give it to SNF and hospital because without insurance card patients chart states he just has Medicare B. Family is still wanting [facility] and facility reached out to [facility] [employer] to figure out what was going on with insurance . Family very addiment that patient go to rehab at [facility] and they are also addiment that patient has Medicare A and B but has no proof.   Leonard [name], MSW,  [place]

## 2017-03-18 ENCOUNTER — Telehealth: Payer: Self-pay | Admitting: Vascular Surgery

## 2017-03-18 DIAGNOSIS — I130 Hypertensive heart and chronic kidney disease with heart failure and stage 1 through stage 4 chronic kidney disease, or unspecified chronic kidney disease: Secondary | ICD-10-CM | POA: Diagnosis not present

## 2017-03-18 DIAGNOSIS — I96 Gangrene, not elsewhere classified: Secondary | ICD-10-CM | POA: Diagnosis not present

## 2017-03-18 DIAGNOSIS — E1152 Type 2 diabetes mellitus with diabetic peripheral angiopathy with gangrene: Secondary | ICD-10-CM | POA: Diagnosis not present

## 2017-03-18 DIAGNOSIS — N184 Chronic kidney disease, stage 4 (severe): Secondary | ICD-10-CM | POA: Diagnosis not present

## 2017-03-18 DIAGNOSIS — Z85828 Personal history of other malignant neoplasm of skin: Secondary | ICD-10-CM | POA: Diagnosis not present

## 2017-03-18 DIAGNOSIS — S78119A Complete traumatic amputation at level between unspecified hip and knee, initial encounter: Secondary | ICD-10-CM | POA: Diagnosis not present

## 2017-03-18 DIAGNOSIS — I999 Unspecified disorder of circulatory system: Secondary | ICD-10-CM | POA: Diagnosis not present

## 2017-03-18 DIAGNOSIS — I700 Atherosclerosis of aorta: Secondary | ICD-10-CM | POA: Diagnosis not present

## 2017-03-18 LAB — PROTIME-INR
INR: 2.62
Prothrombin Time: 27.8 seconds — ABNORMAL HIGH (ref 11.4–15.2)

## 2017-03-18 LAB — GLUCOSE, CAPILLARY
GLUCOSE-CAPILLARY: 167 mg/dL — AB (ref 65–99)
Glucose-Capillary: 232 mg/dL — ABNORMAL HIGH (ref 65–99)

## 2017-03-18 MED ORDER — WARFARIN SODIUM 2.5 MG PO TABS: 2.5000 mg | ORAL_TABLET | Freq: Once | ORAL | Status: DC

## 2017-03-18 MED ORDER — LEVOTHYROXINE SODIUM 75 MCG PO TABS
75.0000 ug | ORAL_TABLET | Freq: Every day | ORAL | Status: DC
  Administered 2017-03-18: 75 ug via ORAL
  Filled 2017-03-18: qty 1

## 2017-03-18 MED ORDER — WARFARIN SODIUM 5 MG PO TABS: 5.0000 mg | ORAL_TABLET | Freq: Once | ORAL | Status: DC

## 2017-03-18 MED ORDER — WARFARIN - PHARMACIST DOSING INPATIENT: Freq: Every day | Status: DC

## 2017-03-18 NOTE — Telephone Encounter (Signed)
-----   Message from Mena Goes, RN sent at 03/17/2017  1:18 PM EST ----- Regarding: 4 weeks for AKA staple removal   ----- Message ----- From: Ulyses Amor, PA-C Sent: 03/17/2017   7:50 AM To: Vvs Charge Pool  S/P left AKA f/u with Dr. Donzetta Matters in 4 weeks

## 2017-03-18 NOTE — Progress Notes (Signed)
Orthopedic Tech Progress Note Patient Details:  Leonard [name] 1950/03/11 [id number]  Patient ID: [name], male   DOB: 06-15-1950, 67 y.o.   MRN: [id number]   [name] 03/18/2017, 9:31 AM Called in bio-tech brace order; spoke with Bella Kennedy

## 2017-03-18 NOTE — Progress Notes (Signed)
ANTICOAGULATION CONSULT NOTE - Initial Consult  Pharmacy Consult for Coumadin Indication: chronic DVT  No Known Allergies  Patient Measurements: Height: '5\' 8"'  (172.7 cm) Weight: 163 lb 5.8 oz (74.1 kg) IBW/kg (Calculated) : 68.4  Vital Signs: Temp: 97.7 F (36.5 C) (02/13 0851) Temp Source: Axillary (02/13 0851) BP: 136/56 (02/13 0851) Pulse Rate: 64 (02/13 0851)  Labs: Recent Labs    03/16/17 0345 03/17/17 0312 03/18/17 0738  HGB 8.4*  --   --   HCT 26.9*  --   --   PLT 325  --   --   LABPROT  --   --  27.8*  INR  --   --  2.62  CREATININE  --  2.56*  --     Estimated Creatinine Clearance: 27.8 mL/min (A) (by C-G formula based on SCr of 2.56 mg/dL (H)).   Medical History: Past Medical History:  Diagnosis Date  . Cancer ([place])    skin cancer-  . CHF (congestive heart failure) ([place])   . Chronic kidney disease    not on dialysis  . Coronary artery disease   . Gangrene of toe ([place])    right 5th/notes 01/04/2013   . High cholesterol   . Hypertension   . Iron deficiency anemia 08/03/2015  . 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 PAD (peripheral artery disease) ([place])   . Shortness of breath dyspnea    with exertion  . Type II diabetes mellitus (HCC)    Type II    Medications:  Medications Prior to Admission  Medication Sig Dispense Refill Last Dose  . allopurinol (ZYLOPRIM) 100 MG tablet Take 100 mg by mouth daily.   03/11/2017 at Unknown time  . amLODipine (NORVASC) 10 MG tablet Take 1 tablet (10 mg total) by mouth daily. 30 tablet 6 03/12/2017 at 0800  . atorvastatin (LIPITOR) 40 MG tablet TAKE ONE (1) TABLET BY MOUTH EVERY DAY AT 6:00PM (Patient taking differently: Take 40 mg by mouth once a day at 6 PM) 90 tablet 3 Past Week at Unknown time  . carvedilol (COREG) 25 MG tablet TAKE TWO (2) TABLETS BY MOUTH 2 TIMES DAILY WITH A MEAL (Patient taking differently: TAKE TWO TABLETS (2m) BY MOUTH 2 TIMES DAILY WITH A MEAL) 120 tablet 3  03/12/2017 at 0800  . cloNIDine (CATAPRES) 0.2 MG tablet TAKE TWO (2) TABLETS BY MOUTH 2 TIMES DAILY (Patient taking differently: Take 0.4 mg by mouth two times a day) 120 tablet 3 03/12/2017 at 0800  . doxazosin (CARDURA) 8 MG tablet Take 1 tablet (8 mg total) by mouth every evening. (Patient taking differently: Take 8 mg by mouth at bedtime. )   03/12/2017 at 0800  . FEROSUL 325 (65 Fe) MG tablet TAKE ONE (1) TABLET BY MOUTH TWO (2) TIMES DAILY WITH A MEAL (Patient taking differently: TAKE 325 MG BY MOUTH TWO (2) TIMES DAILY WITH A MEAL) 60 tablet 3 Past Week at Unknown time  . hydrALAZINE (APRESOLINE) 100 MG tablet TAKE ONE TABLET BY MOUTH EVERY EIGHT HOURS (Patient taking differently: TAKE 1 TABLET (100 MG) BY MOUTH TWICE DAILY) 90 tablet 1 03/12/2017 at 0800  . insulin aspart (NOVOLOG FLEXPEN) 100 UNIT/ML FlexPen Inject 15 Units 2 (two) times daily before lunch and supper into the skin. 15 mL 5 03/11/2017 at 0800  . Insulin Glargine (LANTUS SOLOSTAR) 100 UNIT/ML Solostar Pen Inject 50 Units into the skin every morning. (Patient taking differently: Inject 50 Units into the skin daily after breakfast. )  15 mL 3 03/11/2017 at 0800  . isosorbide mononitrate (IMDUR) 60 MG 24 hr tablet TAKE ONE (1) TABLET BY MOUTH EVERY DAY (Patient taking differently: TAKE ONE TABLET (7m) BY MOUTH EVERY DAY) 30 tablet 3 03/12/2017 at 0800  . levothyroxine (SYNTHROID, LEVOTHROID) 75 MCG tablet Take 1 tablet (75 mcg total) by mouth daily. 90 tablet 1 03/12/2017 at 0800  . nitroGLYCERIN (NITROSTAT) 0.4 MG SL tablet Place 1 tablet (0.4 mg total) under the tongue every 5 (five) minutes as needed for chest pain. 30 tablet 12 Taking  . pantoprazole (PROTONIX) 40 MG tablet TAKE ONE (1) TABLET BY MOUTH EVERY DAY (Patient taking differently: TAKE ONE TABLET (466m BY MOUTH EVERY DAY) 90 tablet 0 Past Week at Unknown time  . potassium chloride SA (K-DUR,KLOR-CON) 20 MEQ tablet Take 20 mEq by mouth 2 (two) times daily.    Past Week at Unknown time   . torsemide (DEMADEX) 20 MG tablet Take 3 tablets (60 mg total) by mouth daily. May take an extra 20 mg Daily for weight gain above 212 lbs (Patient taking differently: Take 60 mg by mouth daily. And may take an extra 20 mg daily for weight gain above 212 lbs) 90 tablet 6 03/11/2017 at Unknown time  . traMADol (ULTRAM) 50 MG tablet Take 1 tablet (50 mg total) by mouth every 6 (six) hours as needed for moderate pain. 60 tablet 0 Taking  . warfarin (COUMADIN) 5 MG tablet Take 2.5 mg (1/2 tablet) daily except 5 mg (1 tablet) on Monday and Friday (Patient taking differently: Take 2.5-5 mg by mouth See admin instructions. Take 5 mg by mouth daily on Friday. Take 2.5 mg by mouth daily on all other days) 18 tablet 1 03/05/2017 at 2000  . Blood Glucose Monitoring Suppl (ONETOUCH VERIO FLEX SYSTEM) w/Device KIT 1 kit by Does not apply route daily. (Patient not taking: Reported on 03/10/2017) 1 kit 3 Not Taking at Unknown time  . glucose blood (ONETOUCH VERIO) test strip Use as instructed to test three times daily. ICD-10 code: E11.22 100 each 12 Taking  . ONETOUCH DELICA LANCETS 3393ZISC 1 each by Does not apply route 3 (three) times daily. ICD-10 code: E11.22 (Patient not taking: Reported on 03/10/2017) 100 each 12 Not Taking at Unknown time   Scheduled:  . allopurinol  100 mg Oral Daily  . amLODipine  10 mg Oral Daily  . atorvastatin  40 mg Oral q1800  . carvedilol  50 mg Oral BID WC  . cloNIDine  0.2 mg Oral BID  . doxazosin  8 mg Oral QPM  . hydrALAZINE  100 mg Oral BID  . insulin aspart  0-15 Units Subcutaneous TID WC  . sodium chloride flush  3 mL Intravenous Q12H  . warfarin  5 mg Oral ONCE-1800  . Warfarin - Pharmacist Dosing Inpatient   Does not apply q1800   Infusions:  . sodium chloride    . sodium chloride      Assessment: [id number] on Coumadin PTA for chronic DVT, held for arteriogram and then AKA, now cleared by surgery to resume Coumadin  Goal of Therapy:  INR 2-3   Plan:  Will give  Coumadin 41m86mo x1 today and monitor INR for dose adjustments; will begin Coumadin re-education.  VerWynona NeatharmD, BCPS  03/18/2017,7:48 AM  ADDENDUM:  INR resulted as 2.62 despite no warfarin since 1/31 and last INR was down to 1.97 on 2/10. PTA warfarin dose is 2.41mg56mily except 41mg 72mFridays.  CBC stable, no bleed documented.  Plan: Will reduce today's dose to 2.84m PO x 1 with INR up to 2.62 Daily INR Monitor CBC, s/sx bleeding  HElicia Lamp PharmD, BCPS Clinical Pharmacist Clinical phone for 03/18/2017 until 3:30pm: [phone] after 3:30pm, please call main pharmacy at: [phone] 03/18/2017 10:34 AM

## 2017-03-18 NOTE — Progress Notes (Signed)
Inpatient Diabetes Program Recommendations  AACE/ADA: New Consensus Statement on Inpatient Glycemic Control (2015)  Target Ranges:  Prepandial:   less than 140 mg/dL      Peak postprandial:   less than 180 mg/dL (1-2 hours)      Critically ill patients:  140 - 180 mg/dL   Lab Results  Component Value Date   GLUCAP 167 (H) 03/18/2017   HGBA1C 6.9 01/05/2017    Review of Glycemic ControlResults for ZACKARIAH, VANDERPOL (MRN [id number]) as of 03/18/2017 10:28  Ref. Range 03/17/2017 11:14 03/17/2017 16:36 03/17/2017 20:59 03/18/2017 06:06  Glucose-Capillary Latest Ref Range: 65 - 99 mg/dL 241 (H) 220 (H) 180 (H) 167 (H)    Diabetes history: Type 2 DM Outpatient Diabetes medications: Novolog 15 units bid, Lantus 50 units q AM Current orders for Inpatient glycemic control:  Novolog moderate tid with meals  Inpatient Diabetes Program Recommendations:    If appropriate, consider adding Lantus 10 units daily.   Thanks,  Adah Perl, RN, BC-ADM Inpatient Diabetes Coordinator Pager [phone] (8a-5p)

## 2017-03-18 NOTE — Clinical Social Work Placement (Signed)
   CLINICAL SOCIAL WORK PLACEMENT  NOTE  Date:  03/18/2017  Patient Details  Name: Leonard [name] MRN: [id number] Date of Birth: 10/19/1950  Clinical Social Work is seeking post-discharge placement for this patient at the [place] level of care (*CSW will initial, date and re-position this form in  chart as items are completed):  Yes   Patient/family provided with [place] Work Department's list of facilities offering this level of care within the geographic area requested by the patient (or if unable, by the patient's family).  Yes   Patient/family informed of their freedom to choose among providers that offer the needed level of care, that participate in Medicare, Medicaid or managed care program needed by the patient, have an available bed and are willing to accept the patient.  Yes   Patient/family informed of 's ownership interest in [facility] and [facility], as well as of the fact that they are under no obligation to receive care at these facilities.  PASRR submitted to EDS on       PASRR number received on       Existing PASRR number confirmed on 03/15/17     FL2 transmitted to all facilities in geographic area requested by pt/family on 03/15/17     FL2 transmitted to all facilities within larger geographic area on       Patient informed that his/her managed care company has contracts with or will negotiate with certain facilities, including the following:            Patient/family informed of bed offers received.  Patient chooses bed at [facility]     Physician recommends and patient chooses bed at      Patient to be transferred to [facility] on 03/18/17.  Patient to be transferred to facility by [facility]     Patient family notified on 03/18/17 of transfer.  Name of family member notified:  Rosalind     PHYSICIAN Please sign FL2     Additional  Comment:    _______________________________________________ Jorge Ny, LCSW 03/18/2017, 2:08 PM

## 2017-03-18 NOTE — Progress Notes (Deleted)
[employer] Advance Directive Forms and gave original and 4 copies to family and one copy to nurse secretary for the chart. Conard Novak, Chaplain   03/18/17 1200  Clinical Encounter Type  Visited With Patient and family together  Visit Type Follow-up;Other (Comment) (complete advance directive forms)  Referral From Family  Consult/Referral To [place];Other (Comment) (got witnesses and notary)  Advance Directives (For Healthcare)  Does Patient Have a Medical Advance Directive? Yes  Does patient want to make changes to medical advance directive? No - Patient declined  Type of Paramedic of Attorney

## 2017-03-18 NOTE — Progress Notes (Signed)
Patient will discharge to [facility] ([place]) Anticipated discharge date: 2/13 Family notified: Mackie Pai  Transportation by [employer]- scheduled for 2:30pm  CSW signing off.  Jorge Ny, LCSW Clinical Social Worker [phone]

## 2017-03-18 NOTE — Progress Notes (Addendum)
[facility]  Subjective  - Comfortable, no new issues.   Objective 138/65 64 97.6 F (36.4 C) (Oral) 11 99%  Intake/Output Summary (Last 24 hours) at 03/18/2017 0713 Last data filed at 03/17/2017 2327 Gross per 24 hour  Intake 120 ml  Output 450 ml  Net -330 ml    Left stump dressing clean and dry, removed left open to air.  Heart RRR Lungs non labored breathing Gen NAD  Assessment/Planning: POD # 3 left AKA  Stump is viable.  I will order sock from [employer] today.  Leave current dressing until sock arrives.  INR 03/15/2017 1.97 will restart coumadin left femoral vein DVT.  Ordered INR lab today. Pending insurance approval discharge to SNF. Disposition stable.    Roxy Horseman 03/18/2017 7:13 AM --  Laboratory Lab Results: Recent Labs    03/16/17 0345  WBC 13.8*  HGB 8.4*  HCT 26.9*  PLT 325   BMET Recent Labs    03/17/17 0312  NA 134*  K 4.4  CL 102  CO2 17*  GLUCOSE 271*  BUN 57*  CREATININE 2.56*  CALCIUM 8.1*    COAG Lab Results  Component Value Date   INR 1.97 03/15/2017   INR 2.29 03/12/2017   INR 3.4 03/06/2017   No results found for: PTT   I have interviewed and examined patient with PA and agree with assessment and plan above. inr is therapeutic. Aka site healing well. Dispo will be snf when available.   Dierdra Salameh C. Donzetta Matters, MD [facility] [place] Office: [phone] Pager: [phone]

## 2017-03-18 NOTE — Telephone Encounter (Signed)
Cxl'd old 03/25/17 appt. Sched post op 04/17/17 at 9:45. Called pt's #, spoke to pt's sister this morning, she asked me to call her back later in the afternoon, called her back at 3:45, pt said that she will just call back "one day next week" and hung up.

## 2017-03-18 NOTE — Progress Notes (Signed)
ANTICOAGULATION CONSULT NOTE - Initial Consult  Pharmacy Consult for Coumadin Indication: chronic DVT  No Known Allergies  Patient Measurements: Height: '5\' 8"'  (172.7 cm) Weight: 163 lb 5.8 oz (74.1 kg) IBW/kg (Calculated) : 68.4  Vital Signs: Temp: 97.6 F (36.4 C) (02/13 0540) Temp Source: Oral (02/13 0540) BP: 138/65 (02/13 0540) Pulse Rate: 64 (02/13 0540)  Labs: Recent Labs    03/16/17 0345 03/17/17 0312  HGB 8.4*  --   HCT 26.9*  --   PLT 325  --   CREATININE  --  2.56*    Estimated Creatinine Clearance: 27.8 mL/min (A) (by C-G formula based on SCr of 2.56 mg/dL (H)).   Medical History: Past Medical History:  Diagnosis Date  . Cancer ([place])    skin cancer-  . CHF (congestive heart failure) ([place])   . Chronic kidney disease    not on dialysis  . Coronary artery disease   . Gangrene of toe ([place])    right 5th/notes 01/04/2013   . High cholesterol   . Hypertension   . Iron deficiency anemia 08/03/2015  . 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 PAD (peripheral artery disease) ([place])   . Shortness of breath dyspnea    with exertion  . Type II diabetes mellitus (HCC)    Type II    Medications:  Medications Prior to Admission  Medication Sig Dispense Refill Last Dose  . allopurinol (ZYLOPRIM) 100 MG tablet Take 100 mg by mouth daily.   03/11/2017 at Unknown time  . amLODipine (NORVASC) 10 MG tablet Take 1 tablet (10 mg total) by mouth daily. 30 tablet 6 03/12/2017 at 0800  . atorvastatin (LIPITOR) 40 MG tablet TAKE ONE (1) TABLET BY MOUTH EVERY DAY AT 6:00PM (Patient taking differently: Take 40 mg by mouth once a day at 6 PM) 90 tablet 3 Past Week at Unknown time  . carvedilol (COREG) 25 MG tablet TAKE TWO (2) TABLETS BY MOUTH 2 TIMES DAILY WITH A MEAL (Patient taking differently: TAKE TWO TABLETS (74m) BY MOUTH 2 TIMES DAILY WITH A MEAL) 120 tablet 3 03/12/2017 at 0800  . cloNIDine (CATAPRES) 0.2 MG tablet TAKE TWO (2) TABLETS BY MOUTH 2  TIMES DAILY (Patient taking differently: Take 0.4 mg by mouth two times a day) 120 tablet 3 03/12/2017 at 0800  . doxazosin (CARDURA) 8 MG tablet Take 1 tablet (8 mg total) by mouth every evening. (Patient taking differently: Take 8 mg by mouth at bedtime. )   03/12/2017 at 0800  . FEROSUL 325 (65 Fe) MG tablet TAKE ONE (1) TABLET BY MOUTH TWO (2) TIMES DAILY WITH A MEAL (Patient taking differently: TAKE 325 MG BY MOUTH TWO (2) TIMES DAILY WITH A MEAL) 60 tablet 3 Past Week at Unknown time  . hydrALAZINE (APRESOLINE) 100 MG tablet TAKE ONE TABLET BY MOUTH EVERY EIGHT HOURS (Patient taking differently: TAKE 1 TABLET (100 MG) BY MOUTH TWICE DAILY) 90 tablet 1 03/12/2017 at 0800  . insulin aspart (NOVOLOG FLEXPEN) 100 UNIT/ML FlexPen Inject 15 Units 2 (two) times daily before lunch and supper into the skin. 15 mL 5 03/11/2017 at 0800  . Insulin Glargine (LANTUS SOLOSTAR) 100 UNIT/ML Solostar Pen Inject 50 Units into the skin every morning. (Patient taking differently: Inject 50 Units into the skin daily after breakfast. ) 15 mL 3 03/11/2017 at 0800  . isosorbide mononitrate (IMDUR) 60 MG 24 hr tablet TAKE ONE (1) TABLET BY MOUTH EVERY DAY (Patient taking differently: TAKE ONE TABLET (676m BY  MOUTH EVERY DAY) 30 tablet 3 03/12/2017 at 0800  . levothyroxine (SYNTHROID, LEVOTHROID) 75 MCG tablet Take 1 tablet (75 mcg total) by mouth daily. 90 tablet 1 03/12/2017 at 0800  . nitroGLYCERIN (NITROSTAT) 0.4 MG SL tablet Place 1 tablet (0.4 mg total) under the tongue every 5 (five) minutes as needed for chest pain. 30 tablet 12 Taking  . pantoprazole (PROTONIX) 40 MG tablet TAKE ONE (1) TABLET BY MOUTH EVERY DAY (Patient taking differently: TAKE ONE TABLET (83m) BY MOUTH EVERY DAY) 90 tablet 0 Past Week at Unknown time  . potassium chloride SA (K-DUR,KLOR-CON) 20 MEQ tablet Take 20 mEq by mouth 2 (two) times daily.    Past Week at Unknown time  . torsemide (DEMADEX) 20 MG tablet Take 3 tablets (60 mg total) by mouth daily. May  take an extra 20 mg Daily for weight gain above 212 lbs (Patient taking differently: Take 60 mg by mouth daily. And may take an extra 20 mg daily for weight gain above 212 lbs) 90 tablet 6 03/11/2017 at Unknown time  . traMADol (ULTRAM) 50 MG tablet Take 1 tablet (50 mg total) by mouth every 6 (six) hours as needed for moderate pain. 60 tablet 0 Taking  . warfarin (COUMADIN) 5 MG tablet Take 2.5 mg (1/2 tablet) daily except 5 mg (1 tablet) on Monday and Friday (Patient taking differently: Take 2.5-5 mg by mouth See admin instructions. Take 5 mg by mouth daily on Friday. Take 2.5 mg by mouth daily on all other days) 18 tablet 1 03/05/2017 at 2000  . Blood Glucose Monitoring Suppl (ONETOUCH VERIO FLEX SYSTEM) w/Device KIT 1 kit by Does not apply route daily. (Patient not taking: Reported on 03/10/2017) 1 kit 3 Not Taking at Unknown time  . glucose blood (ONETOUCH VERIO) test strip Use as instructed to test three times daily. ICD-10 code: E11.22 100 each 12 Taking  . ONETOUCH DELICA LANCETS 333IMISC 1 each by Does not apply route 3 (three) times daily. ICD-10 code: E11.22 (Patient not taking: Reported on 03/10/2017) 100 each 12 Not Taking at Unknown time   Scheduled:  . allopurinol  100 mg Oral Daily  . amLODipine  10 mg Oral Daily  . atorvastatin  40 mg Oral q1800  . carvedilol  50 mg Oral BID WC  . cloNIDine  0.2 mg Oral BID  . doxazosin  8 mg Oral QPM  . heparin  5,000 Units Subcutaneous Q8H  . hydrALAZINE  100 mg Oral BID  . insulin aspart  0-15 Units Subcutaneous TID WC  . sodium chloride flush  3 mL Intravenous Q12H   Infusions:  . sodium chloride    . sodium chloride      Assessment: 89yomale on Coumadin PTA for chronic DVT, held for arteriogram and then AKA, now cleared by surgery to resume Coumadin  Goal of Therapy:  INR 2-3   Plan:  Will give Coumadin 519mpo x1 today and monitor INR for dose adjustments; will begin Coumadin re-education.  VeWynona NeatPharmD, BCPS  03/18/2017,7:48  AM

## 2017-03-18 NOTE — Progress Notes (Signed)
Visited with niece and patient. Completed Ad forms and placed one with unit secretary for his file and original and 4 copies to family. Conard Novak, Chaplain   03/18/17 1100  Clinical Encounter Type  Visited With Patient and family together  Visit Type Initial;Other (Comment) (Complete AD)  Referral From Family  Consult/Referral To [place] (For Healthcare)  Does Patient Have a Medical Advance Directive? Yes

## 2017-03-19 DIAGNOSIS — I739 Peripheral vascular disease, unspecified: Secondary | ICD-10-CM | POA: Diagnosis not present

## 2017-03-19 DIAGNOSIS — I509 Heart failure, unspecified: Secondary | ICD-10-CM | POA: Diagnosis not present

## 2017-03-19 DIAGNOSIS — E039 Hypothyroidism, unspecified: Secondary | ICD-10-CM | POA: Diagnosis not present

## 2017-03-19 DIAGNOSIS — I150 Renovascular hypertension: Secondary | ICD-10-CM | POA: Diagnosis not present

## 2017-03-20 DIAGNOSIS — R2689 Other abnormalities of gait and mobility: Secondary | ICD-10-CM | POA: Diagnosis not present

## 2017-03-20 DIAGNOSIS — E785 Hyperlipidemia, unspecified: Secondary | ICD-10-CM | POA: Diagnosis not present

## 2017-03-20 DIAGNOSIS — E119 Type 2 diabetes mellitus without complications: Secondary | ICD-10-CM | POA: Diagnosis not present

## 2017-03-20 DIAGNOSIS — Z89612 Acquired absence of left leg above knee: Secondary | ICD-10-CM | POA: Diagnosis not present

## 2017-03-20 DIAGNOSIS — M79605 Pain in left leg: Secondary | ICD-10-CM | POA: Diagnosis not present

## 2017-03-20 DIAGNOSIS — E088 Diabetes mellitus due to underlying condition with unspecified complications: Secondary | ICD-10-CM | POA: Diagnosis not present

## 2017-03-20 DIAGNOSIS — D649 Anemia, unspecified: Secondary | ICD-10-CM | POA: Diagnosis not present

## 2017-03-23 NOTE — Telephone Encounter (Signed)
Completed and placed in "to be faxed" pile  Smitty Cords, MD [name], PGY-3

## 2017-03-25 ENCOUNTER — Ambulatory Visit: Payer: Medicare Other | Admitting: Vascular Surgery

## 2017-03-26 ENCOUNTER — Other Ambulatory Visit: Payer: Medicare Other

## 2017-03-26 ENCOUNTER — Ambulatory Visit: Payer: Medicare Other | Admitting: Hematology and Oncology

## 2017-03-26 DIAGNOSIS — I739 Peripheral vascular disease, unspecified: Secondary | ICD-10-CM | POA: Diagnosis not present

## 2017-03-26 DIAGNOSIS — D500 Iron deficiency anemia secondary to blood loss (chronic): Secondary | ICD-10-CM | POA: Diagnosis not present

## 2017-03-26 DIAGNOSIS — M109 Gout, unspecified: Secondary | ICD-10-CM | POA: Diagnosis not present

## 2017-03-27 ENCOUNTER — Ambulatory Visit: Payer: Medicare Other

## 2017-03-27 ENCOUNTER — Other Ambulatory Visit: Payer: Medicare Other

## 2017-03-27 ENCOUNTER — Ambulatory Visit: Payer: Medicare Other | Admitting: Nurse Practitioner

## 2017-03-27 DIAGNOSIS — M79605 Pain in left leg: Secondary | ICD-10-CM | POA: Diagnosis not present

## 2017-03-27 DIAGNOSIS — Z89612 Acquired absence of left leg above knee: Secondary | ICD-10-CM | POA: Diagnosis not present

## 2017-03-27 DIAGNOSIS — R2689 Other abnormalities of gait and mobility: Secondary | ICD-10-CM | POA: Diagnosis not present

## 2017-03-30 DIAGNOSIS — I509 Heart failure, unspecified: Secondary | ICD-10-CM | POA: Diagnosis not present

## 2017-03-30 DIAGNOSIS — I739 Peripheral vascular disease, unspecified: Secondary | ICD-10-CM | POA: Diagnosis not present

## 2017-03-30 DIAGNOSIS — I150 Renovascular hypertension: Secondary | ICD-10-CM | POA: Diagnosis not present

## 2017-03-30 DIAGNOSIS — E088 Diabetes mellitus due to underlying condition with unspecified complications: Secondary | ICD-10-CM | POA: Diagnosis not present

## 2017-04-01 DIAGNOSIS — I739 Peripheral vascular disease, unspecified: Secondary | ICD-10-CM | POA: Diagnosis not present

## 2017-04-01 DIAGNOSIS — D500 Iron deficiency anemia secondary to blood loss (chronic): Secondary | ICD-10-CM | POA: Diagnosis not present

## 2017-04-01 DIAGNOSIS — D649 Anemia, unspecified: Secondary | ICD-10-CM | POA: Diagnosis not present

## 2017-04-02 DIAGNOSIS — Z89612 Acquired absence of left leg above knee: Secondary | ICD-10-CM | POA: Diagnosis not present

## 2017-04-02 DIAGNOSIS — R2689 Other abnormalities of gait and mobility: Secondary | ICD-10-CM | POA: Diagnosis not present

## 2017-04-02 DIAGNOSIS — M79605 Pain in left leg: Secondary | ICD-10-CM | POA: Diagnosis not present

## 2017-04-03 ENCOUNTER — Ambulatory Visit (HOSPITAL_COMMUNITY)
Admission: RE | Admit: 2017-04-03 | Discharge: 2017-04-03 | Disposition: A | Discharge status: Home / Self Care | Payer: Medicaid Other | Source: Ambulatory Visit | Attending: Hematology and Oncology | Admitting: Hematology and Oncology

## 2017-04-03 DIAGNOSIS — Z7901 Long term (current) use of anticoagulants: Secondary | ICD-10-CM | POA: Diagnosis not present

## 2017-04-03 DIAGNOSIS — I4891 Unspecified atrial fibrillation: Secondary | ICD-10-CM | POA: Diagnosis not present

## 2017-04-03 LAB — PROTIME-INR

## 2017-04-04 DIAGNOSIS — E088 Diabetes mellitus due to underlying condition with unspecified complications: Secondary | ICD-10-CM | POA: Diagnosis not present

## 2017-04-04 DIAGNOSIS — D649 Anemia, unspecified: Secondary | ICD-10-CM | POA: Diagnosis not present

## 2017-04-04 DIAGNOSIS — I509 Heart failure, unspecified: Secondary | ICD-10-CM | POA: Diagnosis not present

## 2017-04-04 DIAGNOSIS — I480 Paroxysmal atrial fibrillation: Secondary | ICD-10-CM | POA: Diagnosis not present

## 2017-04-06 ENCOUNTER — Other Ambulatory Visit (HOSPITAL_COMMUNITY): Payer: Self-pay | Admitting: Adult Health

## 2017-04-06 ENCOUNTER — Other Ambulatory Visit: Payer: Self-pay | Admitting: Hematology and Oncology

## 2017-04-06 ENCOUNTER — Encounter: Payer: Self-pay | Admitting: Gastroenterology

## 2017-04-06 DIAGNOSIS — I10 Essential (primary) hypertension: Secondary | ICD-10-CM | POA: Diagnosis not present

## 2017-04-06 DIAGNOSIS — I150 Renovascular hypertension: Secondary | ICD-10-CM | POA: Diagnosis not present

## 2017-04-06 DIAGNOSIS — I509 Heart failure, unspecified: Secondary | ICD-10-CM | POA: Diagnosis not present

## 2017-04-06 DIAGNOSIS — D500 Iron deficiency anemia secondary to blood loss (chronic): Secondary | ICD-10-CM | POA: Diagnosis not present

## 2017-04-06 DIAGNOSIS — D631 Anemia in chronic kidney disease: Secondary | ICD-10-CM

## 2017-04-06 DIAGNOSIS — N184 Chronic kidney disease, stage 4 (severe): Principal | ICD-10-CM

## 2017-04-06 DIAGNOSIS — D539 Nutritional anemia, unspecified: Secondary | ICD-10-CM

## 2017-04-06 DIAGNOSIS — I739 Peripheral vascular disease, unspecified: Secondary | ICD-10-CM | POA: Diagnosis not present

## 2017-04-06 DIAGNOSIS — D649 Anemia, unspecified: Secondary | ICD-10-CM | POA: Diagnosis not present

## 2017-04-07 ENCOUNTER — Telehealth: Payer: Self-pay | Admitting: *Deleted

## 2017-04-07 DIAGNOSIS — D649 Anemia, unspecified: Secondary | ICD-10-CM | POA: Diagnosis not present

## 2017-04-07 DIAGNOSIS — I10 Essential (primary) hypertension: Secondary | ICD-10-CM | POA: Diagnosis not present

## 2017-04-07 NOTE — Telephone Encounter (Signed)
Per cover my meds lantus is not covered.  Printed list of alternatives and placed in MDs box.  Let RN clinic know if PA is to be attempted.  [place]eger, Salome Spotted, CMA

## 2017-04-08 DIAGNOSIS — M79605 Pain in left leg: Secondary | ICD-10-CM | POA: Diagnosis not present

## 2017-04-08 DIAGNOSIS — R2689 Other abnormalities of gait and mobility: Secondary | ICD-10-CM | POA: Diagnosis not present

## 2017-04-08 DIAGNOSIS — Z89612 Acquired absence of left leg above knee: Secondary | ICD-10-CM | POA: Diagnosis not present

## 2017-04-09 ENCOUNTER — Ambulatory Visit (INDEPENDENT_AMBULATORY_CARE_PROVIDER_SITE_OTHER): Payer: Medicare Other | Admitting: Gastroenterology

## 2017-04-09 ENCOUNTER — Encounter: Payer: Self-pay | Admitting: Gastroenterology

## 2017-04-09 VITALS — BP 128/68 | HR 62 | Ht 68.0 in | Wt 163.0 lb

## 2017-04-09 DIAGNOSIS — D509 Iron deficiency anemia, unspecified: Secondary | ICD-10-CM | POA: Diagnosis not present

## 2017-04-09 DIAGNOSIS — I779 Disorder of arteries and arterioles, unspecified: Secondary | ICD-10-CM

## 2017-04-09 NOTE — Patient Instructions (Signed)
We will contact  You with a follow up 6 week  Appointment. At that appointment we will discuss with you an endoscopy, and colonoscopy.

## 2017-04-09 NOTE — Progress Notes (Signed)
04/09/2017 Leonard [name] [id number] 23-Jul-1950   HISTORY OF PRESENT ILLNESS:  This is a 67 year old male who is new to our office and has been referred here by Dr. Juanito Doom for evaluation of anemia.  He is here today with his sister.  According to his chart he has long-standing iron deficiency anemia.  No sign of overt GI bleeding as he denies dark and bloody stools, and I do not see any stool for occult blood test.  Has never had a colonoscopy in the past.  Moves his bowels well without issues.  He has Stage 4 chronic kidney disease as well and is on coumadin for chronic DVT.  Hgb 7.1 grams recently.  This was also likely recently worsened with surgery for AKA.  He had this surgery just  2 weeks ago and is just starting rehab.  At this point he is not able to transfer on his own.  He is on ferrous sulfate 325 mg BID currently, but they are unsure how long he has been on this.  Follows with [name] for anemia, which she says is due to CKD as well.  Also ? MGUS.  Of note, he was previously seen by [facility] as an inpatient and was supposed to follow-up as an outpatient in order to have EGD and colonoscopy, which he apparently never did.   Past Medical History:  Diagnosis Date  . Cancer ([place])    skin cancer-  . CHF (congestive heart failure) ([place])   . Chronic kidney disease    not on dialysis  . Coronary artery disease   . Gangrene of toe ([place])    right 5th/notes 01/04/2013   . High cholesterol   . Hypertension   . Iron deficiency anemia 08/03/2015  . 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 PAD (peripheral artery disease) ([place])   . Shortness of breath dyspnea    with exertion  . Type II diabetes mellitus ([place])    Type II   Past Surgical History:  Procedure Laterality Date  . ABDOMINAL AORTOGRAM W/LOWER EXTREMITY N/A 03/12/2017   Procedure: ABDOMINAL AORTOGRAM W/LOWER EXTREMITY;  Surgeon: Conrad Whispering Pines, MD;  Location: [place] [facility];  Service:  Cardiovascular;  Laterality: N/A;  . AMPUTATION Right 01/28/2013   Procedure: RAY AMPUTATION RIGHT  4th & 5th TOE;  Surgeon: Rosetta Posner, MD;  Location: [place];  Service: Vascular;  Laterality: Right;  . AMPUTATION Left 03/15/2017   Procedure: AMPUTATION ABOVE KNEE;  Surgeon: Waynetta Sandy, MD;  Location: [place];  Service: Vascular;  Laterality: Left;  . ANGIOPLASTY / STENTING FEMORAL Right 01/13/2013   superficial femoral artery x 2 (3 mm x 60 mm, 4 mm x 60 mm)  . AV FISTULA PLACEMENT Left 02/11/2016   Procedure: LEFT UPPER ARM BRACHIAL CEPHALIC ARTERIOVENOUS (AV) FISTULA CREATION;  Surgeon: Conrad [place]stown, MD;  Location: [place];  Service: Vascular;  Laterality: Left;  . COLONOSCOPY W/ POLYPECTOMY    . CORONARY ARTERY BYPASS GRAFT N/A 04/07/2013   Procedure: CORONARY ARTERY BYPASS GRAFTING (CABG);  Surgeon: Ivin Poot, MD;  Location: [place];  Service: Open Heart Surgery;  Laterality: N/A;  . INTRAOPERATIVE TRANSESOPHAGEAL ECHOCARDIOGRAM N/A 04/07/2013   Procedure: INTRAOPERATIVE TRANSESOPHAGEAL ECHOCARDIOGRAM;  Surgeon: Ivin Poot, MD;  Location: [place];  Service: Open Heart Surgery;  Laterality: N/A;  . LEFT HEART CATHETERIZATION WITH CORONARY ANGIOGRAM N/A 04/05/2013   Procedure: LEFT HEART CATHETERIZATION WITH CORONARY ANGIOGRAM;  Surgeon: Peter M Martinique, MD;  Location: [facility] CATH LAB;  Service: Cardiovascular;  Laterality: N/A;  . LOWER EXTREMITY ANGIOGRAM Bilateral 01/06/2013   Procedure: LOWER EXTREMITY ANGIOGRAM;  Surgeon: Conrad Medulla, MD;  Location: [facility] CATH LAB;  Service: Cardiovascular;  Laterality: Bilateral;  . LOWER EXTREMITY ANGIOGRAM Right 01/13/2013   Procedure: LOWER EXTREMITY ANGIOGRAM;  Surgeon: Conrad Kerrick, MD;  Location: [facility] CATH LAB;  Service: Cardiovascular;  Laterality: Right;  . SKIN CANCER EXCISION  2017  . THROAT SURGERY  1983   polyps  . TONSILLECTOMY AND ADENOIDECTOMY  ~ 1965    reports that he quit smoking about 4 years ago. His smoking use included  cigarettes. He started smoking about 50 years ago. He has a 46.00 pack-year smoking history. he has never used smokeless tobacco. He reports that he does not drink alcohol or use drugs. family history includes Cancer in his mother and sister; Heart attack in his sister; Hypertension in his mother and sister. No Known Allergies    Outpatient Encounter Medications as of 04/09/2017  Medication Sig  . amLODipine (NORVASC) 10 MG tablet Take 1 tablet (10 mg total) by mouth daily. Needs office visit for further refills  . atorvastatin (LIPITOR) 40 MG tablet TAKE ONE (1) TABLET BY MOUTH EVERY DAY AT 6:00PM (Patient taking differently: Take 40 mg by mouth once a day at 6 PM)  . Blood Glucose Monitoring Suppl (ONETOUCH VERIO FLEX SYSTEM) w/Device KIT 1 kit by Does not apply route daily.  . carvedilol (COREG) 25 MG tablet TAKE TWO (2) TABLETS BY MOUTH 2 TIMES DAILY WITH A MEAL (Patient taking differently: TAKE TWO TABLETS (31m) BY MOUTH 2 TIMES DAILY WITH A MEAL)  . cloNIDine (CATAPRES) 0.2 MG tablet TAKE TWO (2) TABLETS BY MOUTH 2 TIMES DAILY (Patient taking differently: Take 0.4 mg by mouth two times a day)  . doxazosin (CARDURA) 8 MG tablet Take 1 tablet (8 mg total) by mouth every evening. (Patient taking differently: Take 8 mg by mouth at bedtime. )  . FEROSUL 325 (65 Fe) MG tablet TAKE ONE (1) TABLET BY MOUTH TWO (2) TIMES DAILY WITH A MEAL (Patient taking differently: TAKE 325 MG BY MOUTH TWO (2) TIMES DAILY WITH A MEAL)  . glucose blood (ONETOUCH VERIO) test strip Use as instructed to test three times daily. ICD-10 code: E11.22  . hydrALAZINE (APRESOLINE) 100 MG tablet TAKE ONE TABLET BY MOUTH EVERY EIGHT HOURS (Patient taking differently: TAKE 1 TABLET (100 MG) BY MOUTH TWICE DAILY)  . insulin aspart (NOVOLOG FLEXPEN) 100 UNIT/ML FlexPen Inject 15 Units 2 (two) times daily before lunch and supper into the skin.  . Insulin Glargine (LANTUS SOLOSTAR) 100 UNIT/ML Solostar Pen Inject 50 Units into the skin  every morning. (Patient taking differently: Inject 50 Units into the skin daily after breakfast. )  . isosorbide mononitrate (IMDUR) 60 MG 24 hr tablet TAKE ONE (1) TABLET BY MOUTH EVERY DAY (Patient taking differently: TAKE ONE TABLET (659m BY MOUTH EVERY DAY)  . levothyroxine (SYNTHROID, LEVOTHROID) 75 MCG tablet Take 1 tablet (75 mcg total) by mouth daily.  . nitroGLYCERIN (NITROSTAT) 0.4 MG SL tablet Place 1 tablet (0.4 mg total) under the tongue every 5 (five) minutes as needed for chest pain.  . Glory RosebushELICA LANCETS 3325KISC 1 each by Does not apply route 3 (three) times daily. ICD-10 code: E11.22  . oxyCODONE (OXY IR/ROXICODONE) 5 MG immediate release tablet Take 1 tablet (5 mg total) by mouth every 6 (six) hours as  needed for moderate pain.  . pantoprazole (PROTONIX) 40 MG tablet TAKE ONE (1) TABLET BY MOUTH EVERY DAY (Patient taking differently: TAKE ONE TABLET (70m) BY MOUTH EVERY DAY)  . potassium chloride SA (K-DUR,KLOR-CON) 20 MEQ tablet Take 20 mEq by mouth 2 (two) times daily.   .Marland Kitchentorsemide (DEMADEX) 20 MG tablet Take 3 tablets (60 mg total) by mouth daily. May take an extra 20 mg Daily for weight gain above 212 lbs (Patient taking differently: Take 60 mg by mouth daily. And may take an extra 20 mg daily for weight gain above 212 lbs)  . traMADol (ULTRAM) 50 MG tablet Take 1 tablet (50 mg total) by mouth every 6 (six) hours as needed for moderate pain.  .Marland Kitchenwarfarin (COUMADIN) 5 MG tablet Take 2.5 mg (1/2 tablet) daily except 5 mg (1 tablet) on Monday and Friday (Patient taking differently: Take 2.5-5 mg by mouth See admin instructions. Take 5 mg by mouth daily on Friday. Take 2.5 mg by mouth daily on all other days)  . [DISCONTINUED] allopurinol (ZYLOPRIM) 100 MG tablet Take 100 mg by mouth daily.   Facility-Administered Encounter Medications as of 04/09/2017  Medication  . sodium chloride flush (NS) 0.9 % injection 10 mL     REVIEW OF SYSTEMS  : All other systems reviewed and  negative except where noted in the History of Present Illness.   PHYSICAL EXAM: BP 128/68   Pulse 62   Ht _0  (1.727 m)   Wt 163 lb (73.9 kg)   BMI 24.78 kg/m  General: Well developed black male in no acute distress Head: Normocephalic and atraumatic Eyes:  Sclerae anicteric, conjunctiva pink. Ears: Normal auditory acuity Lungs: Clear throughout to auscultation; no increased WOB. Heart: Regular rate and rhythm; no M/R/G. Abdomen: Soft, non-distended.  BS present.  Non-tender. Rectal:  Will be done at the time of colonoscopy. Skin: No lesions on visible extremities Extremities: Left AKA. Neurological: Alert oriented x 4, grossly non-focal Psychological:  Alert and cooperative. Normal mood and affect  ASSESSMENT AND PLAN: *67year old male who has long-standing iron deficiency anemia.  No sign of overt GI bleeding and I do not see any stool for occult blood test.  Has never had a colonoscopy in the past.  He certainly has multifactorial anemia partly due to chronic kidney disease as well.  This was also likely recently worsened with surgery for AKA.  He had this surgery just  2 weeks ago and is just starting rehab.  At this point he is not able to transfer on his own.  Due to this he will need these procedures performed at the hospital setting.  Unfortunately we did not have any availability for hospital time until June.  As stated above this has been an ongoing issue for years so a couple of months certainly will not make a difference in the grand scheme of things particularly while he is recovering from his surgery.  That being said, I am going to see him back in about 6-8 weeks to reassess his progress.  If he is able to transfer at that time then we may be able to schedule the procedure a little sooner in our outpatient endoscopy center.  He is also on Coumadin for chronic DVT, which will need to be addressed as well.  Until then his hemoglobin should be monitored closely.  This was  discussed with the patient and his sister and they are in agreement and understanding.   CC:  Gambino,  Arlie Solomons, MD

## 2017-04-10 ENCOUNTER — Inpatient Hospital Stay: Payer: Medicare Other | Attending: Hematology and Oncology

## 2017-04-10 ENCOUNTER — Ambulatory Visit: Payer: Medicare Other | Admitting: Hematology and Oncology

## 2017-04-10 ENCOUNTER — Encounter: Payer: Self-pay | Admitting: Hematology and Oncology

## 2017-04-10 ENCOUNTER — Inpatient Hospital Stay (HOSPITAL_BASED_OUTPATIENT_CLINIC_OR_DEPARTMENT_OTHER): Payer: Medicare Other | Admitting: Hematology and Oncology

## 2017-04-10 ENCOUNTER — Telehealth: Payer: Self-pay | Admitting: Hematology and Oncology

## 2017-04-10 ENCOUNTER — Inpatient Hospital Stay: Payer: Medicare Other

## 2017-04-10 VITALS — BP 151/57 | HR 73 | Temp 98.2°F | Resp 18 | Ht 68.0 in

## 2017-04-10 DIAGNOSIS — N184 Chronic kidney disease, stage 4 (severe): Secondary | ICD-10-CM

## 2017-04-10 DIAGNOSIS — Z794 Long term (current) use of insulin: Secondary | ICD-10-CM | POA: Diagnosis not present

## 2017-04-10 DIAGNOSIS — I5030 Unspecified diastolic (congestive) heart failure: Secondary | ICD-10-CM | POA: Insufficient documentation

## 2017-04-10 DIAGNOSIS — Z79899 Other long term (current) drug therapy: Secondary | ICD-10-CM | POA: Insufficient documentation

## 2017-04-10 DIAGNOSIS — Z7901 Long term (current) use of anticoagulants: Secondary | ICD-10-CM | POA: Diagnosis not present

## 2017-04-10 DIAGNOSIS — I739 Peripheral vascular disease, unspecified: Secondary | ICD-10-CM | POA: Diagnosis not present

## 2017-04-10 DIAGNOSIS — D472 Monoclonal gammopathy: Secondary | ICD-10-CM | POA: Insufficient documentation

## 2017-04-10 DIAGNOSIS — R803 Bence Jones proteinuria: Secondary | ICD-10-CM | POA: Diagnosis not present

## 2017-04-10 DIAGNOSIS — I129 Hypertensive chronic kidney disease with stage 1 through stage 4 chronic kidney disease, or unspecified chronic kidney disease: Secondary | ICD-10-CM | POA: Insufficient documentation

## 2017-04-10 DIAGNOSIS — Z89519 Acquired absence of unspecified leg below knee: Secondary | ICD-10-CM

## 2017-04-10 DIAGNOSIS — D509 Iron deficiency anemia, unspecified: Secondary | ICD-10-CM

## 2017-04-10 DIAGNOSIS — R040 Epistaxis: Secondary | ICD-10-CM | POA: Insufficient documentation

## 2017-04-10 DIAGNOSIS — Z862 Personal history of diseases of the blood and blood-forming organs and certain disorders involving the immune mechanism: Secondary | ICD-10-CM | POA: Insufficient documentation

## 2017-04-10 DIAGNOSIS — D631 Anemia in chronic kidney disease: Secondary | ICD-10-CM

## 2017-04-10 DIAGNOSIS — E1122 Type 2 diabetes mellitus with diabetic chronic kidney disease: Secondary | ICD-10-CM | POA: Diagnosis not present

## 2017-04-10 DIAGNOSIS — D539 Nutritional anemia, unspecified: Secondary | ICD-10-CM

## 2017-04-10 LAB — CBC WITH DIFFERENTIAL/PLATELET
Basophils Absolute: 0 10*3/uL (ref 0.0–0.1)
Basophils Relative: 1 %
EOS ABS: 0.2 10*3/uL (ref 0.0–0.5)
EOS PCT: 4 %
HCT: 22.9 % — ABNORMAL LOW (ref 38.4–49.9)
Hemoglobin: 7.2 g/dL — ABNORMAL LOW (ref 13.0–17.1)
LYMPHS ABS: 0.8 10*3/uL — AB (ref 0.9–3.3)
LYMPHS PCT: 13 %
MCH: 22.8 pg — ABNORMAL LOW (ref 27.2–33.4)
MCHC: 31.2 g/dL — ABNORMAL LOW (ref 32.0–36.0)
MCV: 73 fL — ABNORMAL LOW (ref 79.3–98.0)
MONO ABS: 0.4 10*3/uL (ref 0.1–0.9)
Monocytes Relative: 7 %
Neutro Abs: 4.8 10*3/uL (ref 1.5–6.5)
Neutrophils Relative %: 75 %
PLATELETS: 193 10*3/uL (ref 140–400)
RBC: 3.14 MIL/uL — AB (ref 4.20–5.82)
RDW: 22.1 % — AB (ref 11.0–14.6)
WBC: 6.3 10*3/uL (ref 4.0–10.3)

## 2017-04-10 LAB — IRON AND TIBC
IRON: 34 ug/dL — AB (ref 42–163)
SATURATION RATIOS: 16 % — AB (ref 42–163)
TIBC: 211 ug/dL (ref 202–409)
UIBC: 177 ug/dL

## 2017-04-10 LAB — PREPARE RBC (CROSSMATCH)

## 2017-04-10 LAB — ABO/RH: ABO/RH(D): B NEG

## 2017-04-10 LAB — FERRITIN: FERRITIN: 505 ng/mL — AB (ref 22–316)

## 2017-04-10 MED ORDER — ACETAMINOPHEN 325 MG PO TABS
650.0000 mg | ORAL_TABLET | Freq: Once | ORAL | Status: AC
  Administered 2017-04-10: 650 mg via ORAL

## 2017-04-10 MED ORDER — DIPHENHYDRAMINE HCL 25 MG PO CAPS
25.0000 mg | ORAL_CAPSULE | Freq: Once | ORAL | Status: AC
  Administered 2017-04-10: 25 mg via ORAL

## 2017-04-10 MED ORDER — DARBEPOETIN ALFA 500 MCG/ML IJ SOSY
PREFILLED_SYRINGE | INTRAMUSCULAR | Status: AC
  Filled 2017-04-10: qty 1

## 2017-04-10 MED ORDER — ACETAMINOPHEN 325 MG PO TABS
ORAL_TABLET | ORAL | Status: AC
  Filled 2017-04-10: qty 2

## 2017-04-10 MED ORDER — SODIUM CHLORIDE 0.9 % IV SOLN
250.0000 mL | Freq: Once | INTRAVENOUS | Status: AC
  Administered 2017-04-10: 250 mL via INTRAVENOUS

## 2017-04-10 MED ORDER — DIPHENHYDRAMINE HCL 25 MG PO CAPS
ORAL_CAPSULE | ORAL | Status: AC
  Filled 2017-04-10: qty 1

## 2017-04-10 NOTE — Progress Notes (Signed)
Reviewed and agree with initial management plan.  Leilani Cespedes T. Gervase Colberg, MD FACG 

## 2017-04-10 NOTE — Assessment & Plan Note (Signed)
He had history of recurrent iron deficiency anemia Iron studies are pending We will replete as needed

## 2017-04-10 NOTE — Patient Instructions (Signed)

## 2017-04-10 NOTE — Progress Notes (Signed)
[facility] OFFICE PROGRESS NOTE  Patient Care Team: Gambino, Christina M, MD as PCP - General (Family Medicine) Coladonato, Joseph, MD as Consulting Physician (Nephrology)  ASSESSMENT & PLAN:  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) The patient has significant recurrent anemia Previously, we discussed performing a bone marrow aspirate and biopsy but the patient missed the appointment due to recurrent hospitalization He will return here on every 2 weeks basis for close blood, monitoring and transfusion support as needed We discussed some of the risks, benefits, and alternatives of blood transfusions. The patient is symptomatic from anemia and the hemoglobin level is critically low.  Some of the side-effects to be expected including risks of transfusion reactions, chills, infection, syndrome of volume overload and risk of hospitalization from various reasons and the patient is willing to proceed and went ahead to sign consent today. He will receive 2 units of blood transfusion today He will also continue to receive darbepoetin injection as needed to keep hemoglobin greater than 11  MGUS (monoclonal gammopathy of unknown significance) The patient was originally referred here because of detectable M spike in his blood which subsequently become undetectable. 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 Extensive workup in the past few months did not take up any abnormal M spike. Although his serum light chains were high, this is likely related to renal disease and the ratio was just barely above the upper limit of normal. Repeat myeloma panel in December 2018 is consistent with the pattern related to chronic renal failure In any case, I do not believe his renal failure and anemia is related to multiple myeloma.   Iron deficiency anemia He had history of recurrent iron deficiency anemia Iron studies are pending We will  replete as needed   Orders Placed This Encounter  Procedures  . Type and screen    Standing Status:   Future    Standing Expiration Date:   04/11/2018  . Prepare RBC    Standing Status:   Standing    Number of Occurrences:   1    Order Specific Question:   # of Units    Answer:   2 units    Order Specific Question:   Transfusion Indications    Answer:   Symptomatic Anemia    Order Specific Question:   If emergent release call blood bank    Answer:   Not emergent release  . Type and screen    INTERVAL HISTORY: Please see below for problem oriented charting. He returns for further follow-up The patient had recent below-knee amputation due to gangrene Since surgery, he feels well Denies chest pain or shortness of breath No dizziness The patient denies any recent signs or symptoms of bleeding such as spontaneous epistaxis, hematuria or hematochezia.  SUMMARY OF ONCOLOGIC HISTORY: Leonard [name] is here because of progressive anemia, renal failure and MGUS. I reviewed reports from his nephrologist office. The patient have significant peripheral vascular disease, diabetes and hypertension. Early this year, he had baseline creatinine of around 1.3 and subsequently developed heart failure. He was placed on aggressive medication/regimen and subsequently was noted to have progressive renal failure and anemia. According to his blood work dated 01/03/2015, white blood cell count is at 6.6, hemoglobin 7.1, platelet count of 153, creatinine of 2.4, calcium of 8.9, MCV 76, iron saturation 15%, ferritin 180, serum vitamin B-12 of 360. Serum protein electrophoresis initially detected M spike but subsequently not quantifiable. Urine immunofixation   showed Bence-Jones Proteinuria. Echocardiogram show preserved ejection fraction 55-60% but with evidence of diastolic heart failure. He denies history of abnormal bone pain or bone fracture. Patient denies history of recurrent infection or atypical  infections such as shingles of meningitis. Denies chills, night sweats, anorexia or abnormal weight loss. He complained of fatigue and shortness of breath on minimal exertion. The patient denies any recent signs or symptoms of bleeding such as spontaneous hematuria or hematochezia. He has occasional epistaxis when he blows his nose. He does not have spontaneous epistaxis Evaluation in December 2016 show he had anemia chronic disease and MGUS  On 02/01/2015, he is started on Aranesp, 200 g every 2 weeks to keep hemoglobin greater than 11 Between December to June 2017, the dose of Aranesp is titrated to maximum 500 g From February 2018 till present, he received intermittent intravenous iron infusion along with several units of blood transfusion Repeat myeloma panel in December 2018 was unremarkable  REVIEW OF SYSTEMS:   Constitutional: Denies fevers, chills or abnormal weight loss Eyes: Denies blurriness of vision Ears, nose, mouth, throat, and face: Denies mucositis or sore throat Respiratory: Denies cough, dyspnea or wheezes Cardiovascular: Denies palpitation, chest discomfort or lower extremity swelling Gastrointestinal:  Denies nausea, heartburn or change in bowel habits Skin: Denies abnormal skin rashes Lymphatics: Denies new lymphadenopathy or easy bruising Neurological:Denies numbness, tingling or new weaknesses Behavioral/Psych: Mood is stable, no new changes  All other systems were reviewed with the patient and are negative.  I have reviewed the past medical history, past surgical history, social history and family history with the patient and they are unchanged from previous note.  ALLERGIES:  has No Known Allergies.  MEDICATIONS:  Current Outpatient Medications  Medication Sig Dispense Refill  . amLODipine (NORVASC) 10 MG tablet Take 1 tablet (10 mg total) by mouth daily. Needs office visit for further refills 30 tablet 0  . atorvastatin (LIPITOR) 40 MG tablet TAKE ONE (1)  TABLET BY MOUTH EVERY DAY AT 6:00PM (Patient taking differently: Take 40 mg by mouth once a day at 6 PM) 90 tablet 3  . Blood Glucose Monitoring Suppl (ONETOUCH VERIO FLEX SYSTEM) w/Device KIT 1 kit by Does not apply route daily. 1 kit 3  . carvedilol (COREG) 25 MG tablet TAKE TWO (2) TABLETS BY MOUTH 2 TIMES DAILY WITH A MEAL (Patient taking differently: TAKE TWO TABLETS (63m) BY MOUTH 2 TIMES DAILY WITH A MEAL) 120 tablet 3  . cloNIDine (CATAPRES) 0.2 MG tablet TAKE TWO (2) TABLETS BY MOUTH 2 TIMES DAILY (Patient taking differently: Take 0.4 mg by mouth two times a day) 120 tablet 3  . doxazosin (CARDURA) 8 MG tablet Take 1 tablet (8 mg total) by mouth every evening. (Patient taking differently: Take 8 mg by mouth at bedtime. )    . FEROSUL 325 (65 Fe) MG tablet TAKE ONE (1) TABLET BY MOUTH TWO (2) TIMES DAILY WITH A MEAL (Patient taking differently: TAKE 325 MG BY MOUTH TWO (2) TIMES DAILY WITH A MEAL) 60 tablet 3  . glucose blood (ONETOUCH VERIO) test strip Use as instructed to test three times daily. ICD-10 code: E11.22 100 each 12  . hydrALAZINE (APRESOLINE) 100 MG tablet TAKE ONE TABLET BY MOUTH EVERY EIGHT HOURS (Patient taking differently: TAKE 1 TABLET (100 MG) BY MOUTH TWICE DAILY) 90 tablet 1  . insulin aspart (NOVOLOG FLEXPEN) 100 UNIT/ML FlexPen Inject 15 Units 2 (two) times daily before lunch and supper into the skin. 15 mL 5  .  Insulin Glargine (LANTUS SOLOSTAR) 100 UNIT/ML Solostar Pen Inject 50 Units into the skin every morning. (Patient taking differently: Inject 50 Units into the skin daily after breakfast. ) 15 mL 3  . isosorbide mononitrate (IMDUR) 60 MG 24 hr tablet TAKE ONE (1) TABLET BY MOUTH EVERY DAY (Patient taking differently: TAKE ONE TABLET (60mg) BY MOUTH EVERY DAY) 30 tablet 3  . levothyroxine (SYNTHROID, LEVOTHROID) 75 MCG tablet Take 1 tablet (75 mcg total) by mouth daily. 90 tablet 1  . nitroGLYCERIN (NITROSTAT) 0.4 MG SL tablet Place 1 tablet (0.4 mg total) under the  tongue every 5 (five) minutes as needed for chest pain. 30 tablet 12  . ONETOUCH DELICA LANCETS 33G MISC 1 each by Does not apply route 3 (three) times daily. ICD-10 code: E11.22 100 each 12  . oxyCODONE (OXY IR/ROXICODONE) 5 MG immediate release tablet Take 1 tablet (5 mg total) by mouth every 6 (six) hours as needed for moderate pain. 30 tablet 0  . pantoprazole (PROTONIX) 40 MG tablet TAKE ONE (1) TABLET BY MOUTH EVERY DAY (Patient taking differently: TAKE ONE TABLET (40mg) BY MOUTH EVERY DAY) 90 tablet 0  . potassium chloride SA (K-DUR,KLOR-CON) 20 MEQ tablet Take 20 mEq by mouth 2 (two) times daily.     . torsemide (DEMADEX) 20 MG tablet Take 3 tablets (60 mg total) by mouth daily. May take an extra 20 mg Daily for weight gain above 212 lbs (Patient taking differently: Take 60 mg by mouth daily. And may take an extra 20 mg daily for weight gain above 212 lbs) 90 tablet 6  . traMADol (ULTRAM) 50 MG tablet Take 1 tablet (50 mg total) by mouth every 6 (six) hours as needed for moderate pain. 60 tablet 0  . warfarin (COUMADIN) 5 MG tablet Take 2.5 mg (1/2 tablet) daily except 5 mg (1 tablet) on Monday and Friday (Patient taking differently: Take 2.5-5 mg by mouth See admin instructions. Take 5 mg by mouth daily on Friday. Take 2.5 mg by mouth daily on all other days) 18 tablet 1   No current facility-administered medications for this visit.    Facility-Administered Medications Ordered in Other Visits  Medication Dose Route Frequency Provider Last Rate Last Dose  . sodium chloride flush (NS) 0.9 % injection 10 mL  10 mL Intracatheter PRN , , MD        PHYSICAL EXAMINATION: ECOG PERFORMANCE STATUS: 2 - Symptomatic, <50% confined to bed  Vitals:   04/10/17 1158  BP: (!) 151/57  Pulse: 73  Resp: 18  Temp: 98.2 F (36.8 C)  SpO2: 100%   There were no vitals filed for this visit.  GENERAL:alert, no distress and comfortable SKIN: skin color, texture, turgor are normal, no rashes or  significant lesions EYES: normal, Conjunctiva are pink and non-injected, sclera clear OROPHARYNX:no exudate, no erythema and lips, buccal mucosa, and tongue normal  NECK: supple, thyroid normal size, non-tender, without nodularity LYMPH:  no palpable lymphadenopathy in the cervical, axillary or inguinal LUNGS: clear to auscultation and percussion with normal breathing effort HEART: regular rate & rhythm and no murmurs and no lower extremity edema ABDOMEN:abdomen soft, non-tender and normal bowel sounds Musculoskeletal:no cyanosis of digits and no clubbing  NEURO: alert & oriented x 3 with fluent speech, no focal motor/sensory deficits  LABORATORY DATA:  I have reviewed the data as listed    Component Value Date/Time   NA 134 (L) 03/17/2017 0312   NA 139 02/11/2017 1220   NA 140 08/16/2015   1554   K 4.4 03/17/2017 0312   K 3.8 08/16/2015 1554   CL 102 03/17/2017 0312   CO2 17 (L) 03/17/2017 0312   CO2 21 (L) 08/16/2015 1554   GLUCOSE 271 (H) 03/17/2017 0312   GLUCOSE 133 08/16/2015 1554   BUN 57 (H) 03/17/2017 0312   BUN 54 (H) 02/11/2017 1220   BUN 51.0 (H) 08/16/2015 1554   CREATININE 2.56 (H) 03/17/2017 0312   CREATININE 3.64 (H) 01/09/2016 1606   CREATININE 2.7 (H) 08/16/2015 1554   CALCIUM 8.1 (L) 03/17/2017 0312   CALCIUM NOPLAS 10/24/2015 1415   CALCIUM 8.9 08/16/2015 1554   PROT 6.3 (L) 02/07/2017 1847   PROT 6.6 01/16/2017 1339   PROT 7.0 08/16/2015 1554   ALBUMIN 2.4 (L) 02/07/2017 1847   ALBUMIN 3.4 (L) 08/16/2015 1554   AST 26 02/07/2017 1847   AST 17 08/16/2015 1554   ALT 12 (L) 02/07/2017 1847   ALT 13 08/16/2015 1554   ALKPHOS 93 02/07/2017 1847   ALKPHOS 89 08/16/2015 1554   BILITOT 0.4 02/07/2017 1847   BILITOT 0.30 08/16/2015 1554   GFRNONAA 25 (L) 03/17/2017 0312   GFRNONAA 27 (L) 07/20/2015 0949   GFRAA 29 (L) 03/17/2017 0312   GFRAA 31 (L) 07/20/2015 0949    No results found for: SPEP, UPEP  Lab Results  Component Value Date   WBC 6.3  04/10/2017   NEUTROABS 4.8 04/10/2017   HGB 7.2 (L) 04/10/2017   HCT 22.9 (L) 04/10/2017   MCV 73.0 (L) 04/10/2017   PLT 193 04/10/2017      Chemistry      Component Value Date/Time   NA 134 (L) 03/17/2017 0312   NA 139 02/11/2017 1220   NA 140 08/16/2015 1554   K 4.4 03/17/2017 0312   K 3.8 08/16/2015 1554   CL 102 03/17/2017 0312   CO2 17 (L) 03/17/2017 0312   CO2 21 (L) 08/16/2015 1554   BUN 57 (H) 03/17/2017 0312   BUN 54 (H) 02/11/2017 1220   BUN 51.0 (H) 08/16/2015 1554   CREATININE 2.56 (H) 03/17/2017 0312   CREATININE 3.64 (H) 01/09/2016 1606   CREATININE 2.7 (H) 08/16/2015 1554      Component Value Date/Time   CALCIUM 8.1 (L) 03/17/2017 0312   CALCIUM NOPLAS 10/24/2015 1415   CALCIUM 8.9 08/16/2015 1554   ALKPHOS 93 02/07/2017 1847   ALKPHOS 89 08/16/2015 1554   AST 26 02/07/2017 1847   AST 17 08/16/2015 1554   ALT 12 (L) 02/07/2017 1847   ALT 13 08/16/2015 1554   BILITOT 0.4 02/07/2017 1847   BILITOT 0.30 08/16/2015 1554      All questions were answered. The patient knows to call the clinic with any problems, questions or concerns. No barriers to learning was detected.  I spent 15 minutes counseling the patient face to face. The total time spent in the appointment was 20 minutes and more than 50% was on counseling and review of test results  Heath Lark, MD 04/10/2017 12:50 PM

## 2017-04-10 NOTE — Telephone Encounter (Signed)
Gave avs and calendar for April  °

## 2017-04-10 NOTE — Assessment & Plan Note (Addendum)
The patient has significant recurrent anemia Previously, we discussed performing a bone marrow aspirate and biopsy but the patient missed the appointment due to recurrent hospitalization He will return here on every 2 weeks basis for close blood, monitoring and transfusion support as needed We discussed some of the risks, benefits, and alternatives of blood transfusions. The patient is symptomatic from anemia and the hemoglobin level is critically low.  Some of the side-effects to be expected including risks of transfusion reactions, chills, infection, syndrome of volume overload and risk of hospitalization from various reasons and the patient is willing to proceed and went ahead to sign consent today. He will receive 2 units of blood transfusion today He will also continue to receive darbepoetin injection as needed to keep hemoglobin greater than 11

## 2017-04-10 NOTE — Assessment & Plan Note (Signed)
The patient was originally referred here because of detectable M spike in his blood which subsequently become undetectable. 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 Extensive workup in the past few months did not take up any abnormal M spike. Although his serum light chains were high, this is likely related to renal disease and the ratio was just barely above the upper limit of normal. Repeat myeloma panel in December 2018 is consistent with the pattern related to chronic renal failure In any case, I do not believe his renal failure and anemia is related to multiple myeloma.

## 2017-04-11 LAB — TYPE AND SCREEN
ABO/RH(D): B NEG
ANTIBODY SCREEN: NEGATIVE
Unit division: 0
Unit division: 0

## 2017-04-11 LAB — BPAM RBC
BLOOD PRODUCT EXPIRATION DATE: 201903132359
BLOOD PRODUCT EXPIRATION DATE: 201903152359
ISSUE DATE / TIME: 201903081319
ISSUE DATE / TIME: 201903081319
Unit Type and Rh: 1700
Unit Type and Rh: 1700

## 2017-04-13 DIAGNOSIS — I150 Renovascular hypertension: Secondary | ICD-10-CM | POA: Diagnosis not present

## 2017-04-13 DIAGNOSIS — I509 Heart failure, unspecified: Secondary | ICD-10-CM | POA: Diagnosis not present

## 2017-04-13 DIAGNOSIS — D649 Anemia, unspecified: Secondary | ICD-10-CM | POA: Diagnosis not present

## 2017-04-13 DIAGNOSIS — D500 Iron deficiency anemia secondary to blood loss (chronic): Secondary | ICD-10-CM | POA: Diagnosis not present

## 2017-04-13 DIAGNOSIS — I10 Essential (primary) hypertension: Secondary | ICD-10-CM | POA: Diagnosis not present

## 2017-04-13 DIAGNOSIS — I739 Peripheral vascular disease, unspecified: Secondary | ICD-10-CM | POA: Diagnosis not present

## 2017-04-14 DIAGNOSIS — Z7901 Long term (current) use of anticoagulants: Secondary | ICD-10-CM | POA: Diagnosis not present

## 2017-04-14 DIAGNOSIS — A0472 Enterocolitis due to Clostridium difficile, not specified as recurrent: Secondary | ICD-10-CM | POA: Diagnosis not present

## 2017-04-15 ENCOUNTER — Other Ambulatory Visit (HOSPITAL_COMMUNITY): Payer: Self-pay

## 2017-04-15 DIAGNOSIS — A0472 Enterocolitis due to Clostridium difficile, not specified as recurrent: Secondary | ICD-10-CM | POA: Diagnosis not present

## 2017-04-16 ENCOUNTER — Ambulatory Visit: Payer: Medicare Other | Admitting: Hematology and Oncology

## 2017-04-16 NOTE — Progress Notes (Signed)
CHP visit with pt at the rehab facility to check on him and his current status.  Pt had an above the knee amputation several weeks ago.  I asked him how much longer does he expect to be in the facility, he stated that he doesn't know. He stated that he has to be able to get around better with the wheelchair before they will allow him to go home.    Pt appeared to be in good spirits and eager to go home.  He asked me for a honey bun but I advised him to stick with the foods that the facility is giving him.  He stated that he has been taking his meds as they give them to him.  I will f/u with pt in a few weeks.

## 2017-04-17 ENCOUNTER — Ambulatory Visit (INDEPENDENT_AMBULATORY_CARE_PROVIDER_SITE_OTHER): Payer: Medicare Other | Admitting: Vascular Surgery

## 2017-04-17 ENCOUNTER — Telehealth: Payer: Self-pay

## 2017-04-17 ENCOUNTER — Encounter: Payer: Self-pay | Admitting: Vascular Surgery

## 2017-04-17 VITALS — BP 140/72 | HR 79 | Temp 99.0°F | Resp 20 | Ht 68.0 in | Wt 163.0 lb

## 2017-04-17 DIAGNOSIS — I70269 Atherosclerosis of native arteries of extremities with gangrene, unspecified extremity: Secondary | ICD-10-CM

## 2017-04-17 DIAGNOSIS — N184 Chronic kidney disease, stage 4 (severe): Secondary | ICD-10-CM

## 2017-04-17 NOTE — Telephone Encounter (Signed)
Kathlee Nations with medical supply called, received order for incontinence supplies- needs MD who is [employer] certified. Give info of Dr. Andria Frames. Will be faxing new order for Dr. Andria Frames to sign. Wallace Cullens, RN

## 2017-04-17 NOTE — Progress Notes (Signed)
Subjective:     Patient ID: Leonard [name], male   DOB: [birth year], 67 y.o.   MRN: [id number]  HPI 67 year old male follows up from recent left BKA for extensive left lower extremity wounds.  He has chronic kidney disease right lower extremity was not evaluated at recent angiogram.  He does have previous right fourth and fifth toe amputations in 2014.  He remains in a snf amputation is healed well.  No complaints today.  Review of Systems No issues today    Objective:   Physical Exam Awake and alert Well healing left aka with staples in place Healed right 4/5 toes amputation site Strong thrill in left upper arm    Assessment/plan     68 year old male follows up from recent left AKA during hospitalization.  He remains in a nursing facility regaining strength.  He has chronic kidney disease has a left arm fistula that is working well has not needed to use it yet.  We will have him follow-up in 3 months we can evaluate him for prosthesis and also yet right lower extremity duplex and ABIs to follow his chronic arterial insufficiency.  Demonstrates good understanding the presence of his family.  Ranvir Renovato C. Donzetta Matters, MD [facility] Office: [phone] Pager: [phone]

## 2017-04-20 ENCOUNTER — Other Ambulatory Visit: Payer: Self-pay

## 2017-04-20 DIAGNOSIS — I739 Peripheral vascular disease, unspecified: Secondary | ICD-10-CM

## 2017-04-20 DIAGNOSIS — I150 Renovascular hypertension: Secondary | ICD-10-CM | POA: Diagnosis not present

## 2017-04-20 DIAGNOSIS — D500 Iron deficiency anemia secondary to blood loss (chronic): Secondary | ICD-10-CM | POA: Diagnosis not present

## 2017-04-20 DIAGNOSIS — I70269 Atherosclerosis of native arteries of extremities with gangrene, unspecified extremity: Secondary | ICD-10-CM

## 2017-04-20 DIAGNOSIS — I509 Heart failure, unspecified: Secondary | ICD-10-CM | POA: Diagnosis not present

## 2017-04-21 ENCOUNTER — Other Ambulatory Visit: Payer: Self-pay | Admitting: Hematology and Oncology

## 2017-04-21 DIAGNOSIS — D539 Nutritional anemia, unspecified: Secondary | ICD-10-CM

## 2017-04-21 DIAGNOSIS — D631 Anemia in chronic kidney disease: Secondary | ICD-10-CM

## 2017-04-21 DIAGNOSIS — N184 Chronic kidney disease, stage 4 (severe): Principal | ICD-10-CM

## 2017-04-23 DIAGNOSIS — E088 Diabetes mellitus due to underlying condition with unspecified complications: Secondary | ICD-10-CM | POA: Diagnosis not present

## 2017-04-23 DIAGNOSIS — A0472 Enterocolitis due to Clostridium difficile, not specified as recurrent: Secondary | ICD-10-CM | POA: Diagnosis not present

## 2017-04-23 DIAGNOSIS — K30 Functional dyspepsia: Secondary | ICD-10-CM | POA: Diagnosis not present

## 2017-04-23 DIAGNOSIS — E039 Hypothyroidism, unspecified: Secondary | ICD-10-CM | POA: Diagnosis not present

## 2017-04-23 NOTE — Addendum Note (Signed)
Addended by: [name] on: 04/23/2017 09:15 AM   Modules accepted: Orders

## 2017-04-23 NOTE — Telephone Encounter (Signed)
Called patient with no answer. Spoke to his sister [name] on the phone however, she notes that patient is a SNF right now after a leg amputation. Apparently insurance is not covering Lantus right now, it looks like he has been on Levemir while in the SNF, unclear which dose. Will await for patient to follow up with me after his SNF stay for diabetes management.   Smitty Cords, MD [place], PGY-3

## 2017-04-23 NOTE — Telephone Encounter (Signed)
Opened in error. Deseree Blount, CMA 

## 2017-04-24 ENCOUNTER — Inpatient Hospital Stay: Payer: Medicare Other

## 2017-04-24 VITALS — BP 126/57 | HR 62 | Temp 98.1°F | Resp 16

## 2017-04-24 DIAGNOSIS — R803 Bence Jones proteinuria: Secondary | ICD-10-CM | POA: Diagnosis not present

## 2017-04-24 DIAGNOSIS — Z79899 Other long term (current) drug therapy: Secondary | ICD-10-CM | POA: Diagnosis not present

## 2017-04-24 DIAGNOSIS — D631 Anemia in chronic kidney disease: Secondary | ICD-10-CM

## 2017-04-24 DIAGNOSIS — D539 Nutritional anemia, unspecified: Secondary | ICD-10-CM

## 2017-04-24 DIAGNOSIS — N189 Chronic kidney disease, unspecified: Secondary | ICD-10-CM

## 2017-04-24 DIAGNOSIS — N184 Chronic kidney disease, stage 4 (severe): Secondary | ICD-10-CM | POA: Diagnosis not present

## 2017-04-24 DIAGNOSIS — D472 Monoclonal gammopathy: Secondary | ICD-10-CM | POA: Diagnosis not present

## 2017-04-24 DIAGNOSIS — I129 Hypertensive chronic kidney disease with stage 1 through stage 4 chronic kidney disease, or unspecified chronic kidney disease: Secondary | ICD-10-CM | POA: Diagnosis not present

## 2017-04-24 LAB — CBC WITH DIFFERENTIAL/PLATELET
Basophils Absolute: 0 10*3/uL (ref 0.0–0.1)
Basophils Relative: 0 %
Eosinophils Absolute: 0.1 10*3/uL (ref 0.0–0.5)
Eosinophils Relative: 1 %
HCT: 25.2 % — ABNORMAL LOW (ref 38.4–49.9)
Hemoglobin: 8.1 g/dL — ABNORMAL LOW (ref 13.0–17.1)
Lymphocytes Relative: 7 %
Lymphs Abs: 0.7 10*3/uL — ABNORMAL LOW (ref 0.9–3.3)
MCH: 24.1 pg — ABNORMAL LOW (ref 27.2–33.4)
MCHC: 32.2 g/dL (ref 32.0–36.0)
MCV: 74.7 fL — ABNORMAL LOW (ref 79.3–98.0)
Monocytes Absolute: 0.7 10*3/uL (ref 0.1–0.9)
Monocytes Relative: 7 %
Neutro Abs: 8.1 10*3/uL — ABNORMAL HIGH (ref 1.5–6.5)
Neutrophils Relative %: 85 %
Platelets: 209 10*3/uL (ref 140–400)
RBC: 3.37 MIL/uL — ABNORMAL LOW (ref 4.20–5.82)
RDW: 22.1 % — ABNORMAL HIGH (ref 11.0–14.6)
WBC: 9.6 10*3/uL (ref 4.0–10.3)

## 2017-04-24 LAB — FERRITIN: Ferritin: 599 ng/mL — ABNORMAL HIGH (ref 22–316)

## 2017-04-24 LAB — IRON AND TIBC
Iron: 21 ug/dL — ABNORMAL LOW (ref 42–163)
Saturation Ratios: 12 % — ABNORMAL LOW (ref 42–163)
TIBC: 175 ug/dL — ABNORMAL LOW (ref 202–409)
UIBC: 154 ug/dL

## 2017-04-24 LAB — PREPARE RBC (CROSSMATCH)

## 2017-04-24 MED ORDER — ACETAMINOPHEN 325 MG PO TABS
650.0000 mg | ORAL_TABLET | Freq: Once | ORAL | Status: AC
  Administered 2017-04-24: 650 mg via ORAL

## 2017-04-24 MED ORDER — DIPHENHYDRAMINE HCL 25 MG PO CAPS
25.0000 mg | ORAL_CAPSULE | Freq: Once | ORAL | Status: AC
  Administered 2017-04-24: 25 mg via ORAL

## 2017-04-24 MED ORDER — DARBEPOETIN ALFA 500 MCG/ML IJ SOSY
PREFILLED_SYRINGE | INTRAMUSCULAR | Status: AC
  Filled 2017-04-24: qty 1

## 2017-04-24 MED ORDER — DIPHENHYDRAMINE HCL 25 MG PO CAPS
ORAL_CAPSULE | ORAL | Status: AC
  Filled 2017-04-24: qty 1

## 2017-04-24 MED ORDER — DARBEPOETIN ALFA 500 MCG/ML IJ SOSY
500.0000 ug | PREFILLED_SYRINGE | Freq: Once | INTRAMUSCULAR | Status: AC
  Administered 2017-04-24: 500 ug via SUBCUTANEOUS

## 2017-04-24 MED ORDER — ACETAMINOPHEN 325 MG PO TABS
ORAL_TABLET | ORAL | Status: AC
  Filled 2017-04-24: qty 2

## 2017-04-24 NOTE — Patient Instructions (Signed)

## 2017-04-24 NOTE — Progress Notes (Signed)
Aranesp injection was given in Infusion Room today.

## 2017-04-25 LAB — TYPE AND SCREEN
ABO/RH(D): B NEG
Antibody Screen: NEGATIVE
Unit division: 0

## 2017-04-25 LAB — BPAM RBC
Blood Product Expiration Date: 201904152359
ISSUE DATE / TIME: 201903221238
Unit Type and Rh: 1700

## 2017-04-27 ENCOUNTER — Telehealth: Payer: Self-pay

## 2017-04-27 ENCOUNTER — Telehealth: Payer: Self-pay | Admitting: *Deleted

## 2017-04-27 ENCOUNTER — Other Ambulatory Visit: Payer: Self-pay | Admitting: Hematology and Oncology

## 2017-04-27 DIAGNOSIS — D649 Anemia, unspecified: Secondary | ICD-10-CM | POA: Diagnosis not present

## 2017-04-27 DIAGNOSIS — I5020 Unspecified systolic (congestive) heart failure: Secondary | ICD-10-CM | POA: Diagnosis not present

## 2017-04-27 NOTE — Telephone Encounter (Signed)
Received message to contact pt for IV iron- called mobile number, male answered and said pt was on [place] at a [place] for the last 2 weeks. Will route to Dr Alvy Bimler nurse Tammi RN

## 2017-04-27 NOTE — Telephone Encounter (Signed)
Appt scheduled for IV Iron on Friday @ 0900. [facility] notified.

## 2017-04-27 NOTE — Telephone Encounter (Signed)
-----   Message from Heath Lark, MD sent at 04/27/2017  8:53 AM EDT ----- Regarding: need IV iron His iron saturation is low. He will benefit with IV iron He needs advanced notice to get here. Can you see if he can come in this Friday for IV iron. If so, please send scheduling msg. No need to see me

## 2017-05-01 ENCOUNTER — Inpatient Hospital Stay: Payer: Medicare Other

## 2017-05-01 VITALS — BP 156/64 | HR 72 | Temp 98.5°F | Resp 16

## 2017-05-01 DIAGNOSIS — D472 Monoclonal gammopathy: Secondary | ICD-10-CM | POA: Diagnosis not present

## 2017-05-01 DIAGNOSIS — N183 Chronic kidney disease, stage 3 unspecified: Secondary | ICD-10-CM

## 2017-05-01 DIAGNOSIS — R803 Bence Jones proteinuria: Secondary | ICD-10-CM | POA: Diagnosis not present

## 2017-05-01 DIAGNOSIS — I129 Hypertensive chronic kidney disease with stage 1 through stage 4 chronic kidney disease, or unspecified chronic kidney disease: Secondary | ICD-10-CM | POA: Diagnosis not present

## 2017-05-01 DIAGNOSIS — Z79899 Other long term (current) drug therapy: Secondary | ICD-10-CM | POA: Diagnosis not present

## 2017-05-01 DIAGNOSIS — N184 Chronic kidney disease, stage 4 (severe): Secondary | ICD-10-CM | POA: Diagnosis not present

## 2017-05-01 DIAGNOSIS — D631 Anemia in chronic kidney disease: Secondary | ICD-10-CM

## 2017-05-01 MED ORDER — SODIUM CHLORIDE 0.9 % IV SOLN
510.0000 mg | Freq: Once | INTRAVENOUS | Status: AC
  Administered 2017-05-01: 510 mg via INTRAVENOUS
  Filled 2017-05-01: qty 17

## 2017-05-01 NOTE — Patient Instructions (Signed)

## 2017-05-04 DIAGNOSIS — R41841 Cognitive communication deficit: Secondary | ICD-10-CM | POA: Diagnosis not present

## 2017-05-04 DIAGNOSIS — I509 Heart failure, unspecified: Secondary | ICD-10-CM | POA: Diagnosis not present

## 2017-05-04 DIAGNOSIS — M6281 Muscle weakness (generalized): Secondary | ICD-10-CM | POA: Diagnosis not present

## 2017-05-04 DIAGNOSIS — E088 Diabetes mellitus due to underlying condition with unspecified complications: Secondary | ICD-10-CM | POA: Diagnosis not present

## 2017-05-04 DIAGNOSIS — M109 Gout, unspecified: Secondary | ICD-10-CM | POA: Diagnosis not present

## 2017-05-05 DIAGNOSIS — R41841 Cognitive communication deficit: Secondary | ICD-10-CM | POA: Diagnosis not present

## 2017-05-05 DIAGNOSIS — E11621 Type 2 diabetes mellitus with foot ulcer: Secondary | ICD-10-CM | POA: Diagnosis not present

## 2017-05-05 DIAGNOSIS — M6281 Muscle weakness (generalized): Secondary | ICD-10-CM | POA: Diagnosis not present

## 2017-05-05 DIAGNOSIS — Z89612 Acquired absence of left leg above knee: Secondary | ICD-10-CM | POA: Diagnosis not present

## 2017-05-05 DIAGNOSIS — M109 Gout, unspecified: Secondary | ICD-10-CM | POA: Diagnosis not present

## 2017-05-05 DIAGNOSIS — R2689 Other abnormalities of gait and mobility: Secondary | ICD-10-CM | POA: Diagnosis not present

## 2017-05-05 DIAGNOSIS — E088 Diabetes mellitus due to underlying condition with unspecified complications: Secondary | ICD-10-CM | POA: Diagnosis not present

## 2017-05-05 DIAGNOSIS — I509 Heart failure, unspecified: Secondary | ICD-10-CM | POA: Diagnosis not present

## 2017-05-06 ENCOUNTER — Ambulatory Visit (INDEPENDENT_AMBULATORY_CARE_PROVIDER_SITE_OTHER): Payer: Medicare Other | Admitting: Podiatry

## 2017-05-06 ENCOUNTER — Encounter: Payer: Self-pay | Admitting: Podiatry

## 2017-05-06 ENCOUNTER — Other Ambulatory Visit: Payer: Self-pay | Admitting: Hematology and Oncology

## 2017-05-06 DIAGNOSIS — I509 Heart failure, unspecified: Secondary | ICD-10-CM | POA: Diagnosis not present

## 2017-05-06 DIAGNOSIS — E088 Diabetes mellitus due to underlying condition with unspecified complications: Secondary | ICD-10-CM | POA: Diagnosis not present

## 2017-05-06 DIAGNOSIS — L84 Corns and callosities: Secondary | ICD-10-CM

## 2017-05-06 DIAGNOSIS — B351 Tinea unguium: Secondary | ICD-10-CM

## 2017-05-06 DIAGNOSIS — Z89432 Acquired absence of left foot: Secondary | ICD-10-CM

## 2017-05-06 DIAGNOSIS — M109 Gout, unspecified: Secondary | ICD-10-CM | POA: Diagnosis not present

## 2017-05-06 DIAGNOSIS — M79675 Pain in left toe(s): Secondary | ICD-10-CM

## 2017-05-06 DIAGNOSIS — E1140 Type 2 diabetes mellitus with diabetic neuropathy, unspecified: Secondary | ICD-10-CM | POA: Diagnosis not present

## 2017-05-06 DIAGNOSIS — R41841 Cognitive communication deficit: Secondary | ICD-10-CM | POA: Diagnosis not present

## 2017-05-06 DIAGNOSIS — M6281 Muscle weakness (generalized): Secondary | ICD-10-CM | POA: Diagnosis not present

## 2017-05-06 DIAGNOSIS — M79674 Pain in right toe(s): Secondary | ICD-10-CM

## 2017-05-06 NOTE — Progress Notes (Signed)
Patient ID: Leonard [name], male   DOB: 02/06/1950, 67 y.o.   MRN: [id number] Complaint:  Visit Type: Patient returns to my office for continued preventative foot care services. Complaint: Patient states" my nails have grown long and thick and become painful to walk and wear shoes" Patient has been diagnosed with DM with amputation 4,5 rays right foot .Patient also says that he had his left leg amputated 4 weeks  ago.   The patient presents for preventative foot care services.   Patient also has callus under the ball of his right foot.  He presents for preventative foot care services.  Podiatric Exam: Vascular: dorsalis pedis and posterior tibial pulses are not  palpable right . Capillary return is immediate.  Cold right foot.   Absence of hair.  Sensorium: Absent  Semmes Weinstein monofilament test. Normal tactile sensation bilaterally. Nail Exam: Pt has thick disfigured discolored nails with subungual debris noted 1,2,3 right foot. Ulcer Exam: There is no evidence of ulcer or pre-ulcerative changes or infection. Orthopedic Exam: Muscle tone and strength are WNL. No limitations in general ROM. No crepitus or effusions noted. Foot type and digits show no abnormalities. Bony prominences are unremarkable .Amputation 4,5 digits right foot. Skin: Asymptomatic  Porokeratosis left foot.. No infection or ulcers.  Dry scaly skin plantar aspect right foot.  Preulcerous callus sub 1st MPJ right foot.  Hemorrhagic lesion noted in center callus right foot.  Diagnosis:  Onychomycosis, , Pain in right toes .  Diabetic angiopathy and neuropathy with amputation  Treatment & Plan Procedures and Treatment: Consent by patient was obtained for treatment procedures. The patient understood the discussion of treatment and procedures well. All questions were answered thoroughly reviewed. Debridement of mycotic nails 1-3 right foot.  No ulceration, no infection noted. Requests no drill usage.  Debridement of preulcerous  lesion right foot.  Prescribed lac-hydrin for his severe plantar skinj. Return Visit-Office Procedure: Patient instructed to return to the office for a follow up visit 3 months for continued evaluation and treatment.    Gardiner Barefoot DPM

## 2017-05-07 DIAGNOSIS — I509 Heart failure, unspecified: Secondary | ICD-10-CM | POA: Diagnosis not present

## 2017-05-07 DIAGNOSIS — M109 Gout, unspecified: Secondary | ICD-10-CM | POA: Diagnosis not present

## 2017-05-07 DIAGNOSIS — E088 Diabetes mellitus due to underlying condition with unspecified complications: Secondary | ICD-10-CM | POA: Diagnosis not present

## 2017-05-07 DIAGNOSIS — M6281 Muscle weakness (generalized): Secondary | ICD-10-CM | POA: Diagnosis not present

## 2017-05-07 DIAGNOSIS — R41841 Cognitive communication deficit: Secondary | ICD-10-CM | POA: Diagnosis not present

## 2017-05-08 ENCOUNTER — Inpatient Hospital Stay: Payer: Medicare Other

## 2017-05-08 ENCOUNTER — Inpatient Hospital Stay: Payer: Medicare Other | Attending: Hematology and Oncology

## 2017-05-08 VITALS — BP 144/52 | HR 66 | Temp 98.3°F | Resp 17

## 2017-05-08 DIAGNOSIS — D631 Anemia in chronic kidney disease: Secondary | ICD-10-CM | POA: Diagnosis not present

## 2017-05-08 DIAGNOSIS — N184 Chronic kidney disease, stage 4 (severe): Principal | ICD-10-CM

## 2017-05-08 DIAGNOSIS — Z79899 Other long term (current) drug therapy: Secondary | ICD-10-CM | POA: Diagnosis not present

## 2017-05-08 DIAGNOSIS — D539 Nutritional anemia, unspecified: Secondary | ICD-10-CM

## 2017-05-08 DIAGNOSIS — I129 Hypertensive chronic kidney disease with stage 1 through stage 4 chronic kidney disease, or unspecified chronic kidney disease: Secondary | ICD-10-CM | POA: Insufficient documentation

## 2017-05-08 LAB — CBC WITH DIFFERENTIAL/PLATELET
BASOS PCT: 1 %
Basophils Absolute: 0 10*3/uL (ref 0.0–0.1)
EOS ABS: 0.2 10*3/uL (ref 0.0–0.5)
EOS PCT: 3 %
HCT: 28.3 % — ABNORMAL LOW (ref 38.4–49.9)
Hemoglobin: 9 g/dL — ABNORMAL LOW (ref 13.0–17.1)
LYMPHS ABS: 0.7 10*3/uL — AB (ref 0.9–3.3)
Lymphocytes Relative: 11 %
MCH: 25 pg — AB (ref 27.2–33.4)
MCHC: 31.7 g/dL — AB (ref 32.0–36.0)
MCV: 78.9 fL — ABNORMAL LOW (ref 79.3–98.0)
Monocytes Absolute: 0.7 10*3/uL (ref 0.1–0.9)
Monocytes Relative: 10 %
Neutro Abs: 5.3 10*3/uL (ref 1.5–6.5)
Neutrophils Relative %: 75 %
PLATELETS: 194 10*3/uL (ref 140–400)
RBC: 3.59 MIL/uL — ABNORMAL LOW (ref 4.20–5.82)
RDW: 23.2 % — ABNORMAL HIGH (ref 11.0–14.6)
WBC: 7 10*3/uL (ref 4.0–10.3)

## 2017-05-08 LAB — SAMPLE TO BLOOD BANK

## 2017-05-08 MED ORDER — DARBEPOETIN ALFA 500 MCG/ML IJ SOSY
PREFILLED_SYRINGE | INTRAMUSCULAR | Status: AC
  Filled 2017-05-08: qty 1

## 2017-05-08 MED ORDER — DARBEPOETIN ALFA 500 MCG/ML IJ SOSY
500.0000 ug | PREFILLED_SYRINGE | Freq: Once | INTRAMUSCULAR | Status: AC
  Administered 2017-05-08: 500 ug via SUBCUTANEOUS

## 2017-05-08 NOTE — Patient Instructions (Signed)

## 2017-05-10 DIAGNOSIS — I509 Heart failure, unspecified: Secondary | ICD-10-CM | POA: Diagnosis not present

## 2017-05-10 DIAGNOSIS — R41841 Cognitive communication deficit: Secondary | ICD-10-CM | POA: Diagnosis not present

## 2017-05-10 DIAGNOSIS — E088 Diabetes mellitus due to underlying condition with unspecified complications: Secondary | ICD-10-CM | POA: Diagnosis not present

## 2017-05-10 DIAGNOSIS — M6281 Muscle weakness (generalized): Secondary | ICD-10-CM | POA: Diagnosis not present

## 2017-05-10 DIAGNOSIS — M109 Gout, unspecified: Secondary | ICD-10-CM | POA: Diagnosis not present

## 2017-05-11 DIAGNOSIS — R41841 Cognitive communication deficit: Secondary | ICD-10-CM | POA: Diagnosis not present

## 2017-05-11 DIAGNOSIS — I509 Heart failure, unspecified: Secondary | ICD-10-CM | POA: Diagnosis not present

## 2017-05-11 DIAGNOSIS — M109 Gout, unspecified: Secondary | ICD-10-CM | POA: Diagnosis not present

## 2017-05-11 DIAGNOSIS — E088 Diabetes mellitus due to underlying condition with unspecified complications: Secondary | ICD-10-CM | POA: Diagnosis not present

## 2017-05-11 DIAGNOSIS — M6281 Muscle weakness (generalized): Secondary | ICD-10-CM | POA: Diagnosis not present

## 2017-05-12 DIAGNOSIS — R41841 Cognitive communication deficit: Secondary | ICD-10-CM | POA: Diagnosis not present

## 2017-05-12 DIAGNOSIS — E088 Diabetes mellitus due to underlying condition with unspecified complications: Secondary | ICD-10-CM | POA: Diagnosis not present

## 2017-05-12 DIAGNOSIS — M6281 Muscle weakness (generalized): Secondary | ICD-10-CM | POA: Diagnosis not present

## 2017-05-12 DIAGNOSIS — M109 Gout, unspecified: Secondary | ICD-10-CM | POA: Diagnosis not present

## 2017-05-12 DIAGNOSIS — I509 Heart failure, unspecified: Secondary | ICD-10-CM | POA: Diagnosis not present

## 2017-05-18 DIAGNOSIS — I150 Renovascular hypertension: Secondary | ICD-10-CM | POA: Diagnosis not present

## 2017-05-18 DIAGNOSIS — E039 Hypothyroidism, unspecified: Secondary | ICD-10-CM | POA: Diagnosis not present

## 2017-05-18 DIAGNOSIS — E088 Diabetes mellitus due to underlying condition with unspecified complications: Secondary | ICD-10-CM | POA: Diagnosis not present

## 2017-05-18 DIAGNOSIS — I739 Peripheral vascular disease, unspecified: Secondary | ICD-10-CM | POA: Diagnosis not present

## 2017-05-19 DIAGNOSIS — E1152 Type 2 diabetes mellitus with diabetic peripheral angiopathy with gangrene: Secondary | ICD-10-CM | POA: Diagnosis not present

## 2017-05-19 DIAGNOSIS — I129 Hypertensive chronic kidney disease with stage 1 through stage 4 chronic kidney disease, or unspecified chronic kidney disease: Secondary | ICD-10-CM | POA: Diagnosis not present

## 2017-05-19 DIAGNOSIS — M6281 Muscle weakness (generalized): Secondary | ICD-10-CM | POA: Diagnosis not present

## 2017-05-19 DIAGNOSIS — E088 Diabetes mellitus due to underlying condition with unspecified complications: Secondary | ICD-10-CM | POA: Diagnosis not present

## 2017-05-19 DIAGNOSIS — F17201 Nicotine dependence, unspecified, in remission: Secondary | ICD-10-CM | POA: Diagnosis not present

## 2017-05-19 DIAGNOSIS — I2510 Atherosclerotic heart disease of native coronary artery without angina pectoris: Secondary | ICD-10-CM | POA: Diagnosis not present

## 2017-05-19 DIAGNOSIS — N184 Chronic kidney disease, stage 4 (severe): Secondary | ICD-10-CM | POA: Diagnosis not present

## 2017-05-19 DIAGNOSIS — I739 Peripheral vascular disease, unspecified: Secondary | ICD-10-CM | POA: Diagnosis not present

## 2017-05-19 DIAGNOSIS — N179 Acute kidney failure, unspecified: Secondary | ICD-10-CM | POA: Diagnosis not present

## 2017-05-19 DIAGNOSIS — D631 Anemia in chronic kidney disease: Secondary | ICD-10-CM | POA: Diagnosis not present

## 2017-05-19 DIAGNOSIS — N2581 Secondary hyperparathyroidism of renal origin: Secondary | ICD-10-CM | POA: Diagnosis not present

## 2017-05-19 DIAGNOSIS — I5030 Unspecified diastolic (congestive) heart failure: Secondary | ICD-10-CM | POA: Diagnosis not present

## 2017-05-19 DIAGNOSIS — J449 Chronic obstructive pulmonary disease, unspecified: Secondary | ICD-10-CM | POA: Diagnosis not present

## 2017-05-19 DIAGNOSIS — I509 Heart failure, unspecified: Secondary | ICD-10-CM | POA: Diagnosis not present

## 2017-05-19 DIAGNOSIS — E1122 Type 2 diabetes mellitus with diabetic chronic kidney disease: Secondary | ICD-10-CM | POA: Diagnosis not present

## 2017-05-19 DIAGNOSIS — R41841 Cognitive communication deficit: Secondary | ICD-10-CM | POA: Diagnosis not present

## 2017-05-19 DIAGNOSIS — M109 Gout, unspecified: Secondary | ICD-10-CM | POA: Diagnosis not present

## 2017-05-20 DIAGNOSIS — M109 Gout, unspecified: Secondary | ICD-10-CM | POA: Diagnosis not present

## 2017-05-20 DIAGNOSIS — E088 Diabetes mellitus due to underlying condition with unspecified complications: Secondary | ICD-10-CM | POA: Diagnosis not present

## 2017-05-20 DIAGNOSIS — I509 Heart failure, unspecified: Secondary | ICD-10-CM | POA: Diagnosis not present

## 2017-05-20 DIAGNOSIS — M6281 Muscle weakness (generalized): Secondary | ICD-10-CM | POA: Diagnosis not present

## 2017-05-20 DIAGNOSIS — R41841 Cognitive communication deficit: Secondary | ICD-10-CM | POA: Diagnosis not present

## 2017-05-21 DIAGNOSIS — I739 Peripheral vascular disease, unspecified: Secondary | ICD-10-CM | POA: Diagnosis not present

## 2017-05-21 DIAGNOSIS — R41841 Cognitive communication deficit: Secondary | ICD-10-CM | POA: Diagnosis not present

## 2017-05-21 DIAGNOSIS — M6281 Muscle weakness (generalized): Secondary | ICD-10-CM | POA: Diagnosis not present

## 2017-05-21 DIAGNOSIS — M109 Gout, unspecified: Secondary | ICD-10-CM | POA: Diagnosis not present

## 2017-05-21 DIAGNOSIS — E039 Hypothyroidism, unspecified: Secondary | ICD-10-CM | POA: Diagnosis not present

## 2017-05-21 DIAGNOSIS — I509 Heart failure, unspecified: Secondary | ICD-10-CM | POA: Diagnosis not present

## 2017-05-21 DIAGNOSIS — D500 Iron deficiency anemia secondary to blood loss (chronic): Secondary | ICD-10-CM | POA: Diagnosis not present

## 2017-05-21 DIAGNOSIS — E088 Diabetes mellitus due to underlying condition with unspecified complications: Secondary | ICD-10-CM | POA: Diagnosis not present

## 2017-05-22 ENCOUNTER — Inpatient Hospital Stay: Payer: Medicare Other

## 2017-05-22 VITALS — BP 161/71 | HR 65 | Temp 97.8°F | Resp 17

## 2017-05-22 DIAGNOSIS — I129 Hypertensive chronic kidney disease with stage 1 through stage 4 chronic kidney disease, or unspecified chronic kidney disease: Secondary | ICD-10-CM | POA: Diagnosis not present

## 2017-05-22 DIAGNOSIS — D631 Anemia in chronic kidney disease: Secondary | ICD-10-CM | POA: Diagnosis not present

## 2017-05-22 DIAGNOSIS — N184 Chronic kidney disease, stage 4 (severe): Secondary | ICD-10-CM

## 2017-05-22 DIAGNOSIS — N189 Chronic kidney disease, unspecified: Principal | ICD-10-CM

## 2017-05-22 DIAGNOSIS — D539 Nutritional anemia, unspecified: Secondary | ICD-10-CM

## 2017-05-22 DIAGNOSIS — Z79899 Other long term (current) drug therapy: Secondary | ICD-10-CM | POA: Diagnosis not present

## 2017-05-22 LAB — CBC WITH DIFFERENTIAL/PLATELET
Basophils Absolute: 0 10*3/uL (ref 0.0–0.1)
Basophils Relative: 1 %
EOS PCT: 2 %
Eosinophils Absolute: 0.1 10*3/uL (ref 0.0–0.5)
HEMATOCRIT: 28.2 % — AB (ref 38.4–49.9)
Hemoglobin: 8.9 g/dL — ABNORMAL LOW (ref 13.0–17.1)
LYMPHS ABS: 0.8 10*3/uL — AB (ref 0.9–3.3)
LYMPHS PCT: 15 %
MCH: 25.3 pg — AB (ref 27.2–33.4)
MCHC: 31.5 g/dL — ABNORMAL LOW (ref 32.0–36.0)
MCV: 80.5 fL (ref 79.3–98.0)
MONO ABS: 0.3 10*3/uL (ref 0.1–0.9)
Monocytes Relative: 6 %
NEUTROS ABS: 4.2 10*3/uL (ref 1.5–6.5)
Neutrophils Relative %: 76 %
Platelets: 190 10*3/uL (ref 140–400)
RBC: 3.5 MIL/uL — ABNORMAL LOW (ref 4.20–5.82)
RDW: 24.1 % — AB (ref 11.0–14.6)
WBC: 5.5 10*3/uL (ref 4.0–10.3)

## 2017-05-22 LAB — IRON AND TIBC
IRON: 44 ug/dL (ref 42–163)
SATURATION RATIOS: 21 % — AB (ref 42–163)
TIBC: 210 ug/dL (ref 202–409)
UIBC: 166 ug/dL

## 2017-05-22 LAB — SAMPLE TO BLOOD BANK

## 2017-05-22 LAB — FERRITIN: FERRITIN: 565 ng/mL — AB (ref 22–316)

## 2017-05-22 MED ORDER — DARBEPOETIN ALFA 500 MCG/ML IJ SOSY
PREFILLED_SYRINGE | INTRAMUSCULAR | Status: AC
  Filled 2017-05-22: qty 1

## 2017-05-22 MED ORDER — DARBEPOETIN ALFA 500 MCG/ML IJ SOSY
500.0000 ug | PREFILLED_SYRINGE | Freq: Once | INTRAMUSCULAR | Status: AC
  Administered 2017-05-22: 500 ug via SUBCUTANEOUS

## 2017-05-22 NOTE — Progress Notes (Signed)
Pt confirmed that he does have his BP meds at home and will take on a regular basis.

## 2017-05-22 NOTE — Patient Instructions (Signed)

## 2017-05-25 DIAGNOSIS — I509 Heart failure, unspecified: Secondary | ICD-10-CM | POA: Diagnosis not present

## 2017-05-25 DIAGNOSIS — M109 Gout, unspecified: Secondary | ICD-10-CM | POA: Diagnosis not present

## 2017-05-25 DIAGNOSIS — M6281 Muscle weakness (generalized): Secondary | ICD-10-CM | POA: Diagnosis not present

## 2017-05-25 DIAGNOSIS — R41841 Cognitive communication deficit: Secondary | ICD-10-CM | POA: Diagnosis not present

## 2017-05-25 DIAGNOSIS — E088 Diabetes mellitus due to underlying condition with unspecified complications: Secondary | ICD-10-CM | POA: Diagnosis not present

## 2017-05-27 DIAGNOSIS — I5033 Acute on chronic diastolic (congestive) heart failure: Secondary | ICD-10-CM | POA: Diagnosis not present

## 2017-05-27 DIAGNOSIS — I509 Heart failure, unspecified: Secondary | ICD-10-CM | POA: Diagnosis not present

## 2017-05-27 DIAGNOSIS — M109 Gout, unspecified: Secondary | ICD-10-CM | POA: Diagnosis not present

## 2017-05-27 DIAGNOSIS — I480 Paroxysmal atrial fibrillation: Secondary | ICD-10-CM | POA: Diagnosis not present

## 2017-05-27 DIAGNOSIS — R41841 Cognitive communication deficit: Secondary | ICD-10-CM | POA: Diagnosis not present

## 2017-05-27 DIAGNOSIS — I10 Essential (primary) hypertension: Secondary | ICD-10-CM | POA: Diagnosis not present

## 2017-05-27 DIAGNOSIS — M6281 Muscle weakness (generalized): Secondary | ICD-10-CM | POA: Diagnosis not present

## 2017-05-27 DIAGNOSIS — D649 Anemia, unspecified: Secondary | ICD-10-CM | POA: Diagnosis not present

## 2017-05-27 DIAGNOSIS — E088 Diabetes mellitus due to underlying condition with unspecified complications: Secondary | ICD-10-CM | POA: Diagnosis not present

## 2017-05-28 DIAGNOSIS — M109 Gout, unspecified: Secondary | ICD-10-CM | POA: Diagnosis not present

## 2017-05-28 DIAGNOSIS — I509 Heart failure, unspecified: Secondary | ICD-10-CM | POA: Diagnosis not present

## 2017-05-28 DIAGNOSIS — E088 Diabetes mellitus due to underlying condition with unspecified complications: Secondary | ICD-10-CM | POA: Diagnosis not present

## 2017-05-28 DIAGNOSIS — R41841 Cognitive communication deficit: Secondary | ICD-10-CM | POA: Diagnosis not present

## 2017-05-28 DIAGNOSIS — M6281 Muscle weakness (generalized): Secondary | ICD-10-CM | POA: Diagnosis not present

## 2017-06-01 DIAGNOSIS — I509 Heart failure, unspecified: Secondary | ICD-10-CM | POA: Diagnosis not present

## 2017-06-01 DIAGNOSIS — E088 Diabetes mellitus due to underlying condition with unspecified complications: Secondary | ICD-10-CM | POA: Diagnosis not present

## 2017-06-01 DIAGNOSIS — M109 Gout, unspecified: Secondary | ICD-10-CM | POA: Diagnosis not present

## 2017-06-01 DIAGNOSIS — M6281 Muscle weakness (generalized): Secondary | ICD-10-CM | POA: Diagnosis not present

## 2017-06-01 DIAGNOSIS — R41841 Cognitive communication deficit: Secondary | ICD-10-CM | POA: Diagnosis not present

## 2017-06-03 DIAGNOSIS — E088 Diabetes mellitus due to underlying condition with unspecified complications: Secondary | ICD-10-CM | POA: Diagnosis not present

## 2017-06-03 DIAGNOSIS — R41841 Cognitive communication deficit: Secondary | ICD-10-CM | POA: Diagnosis not present

## 2017-06-03 DIAGNOSIS — I509 Heart failure, unspecified: Secondary | ICD-10-CM | POA: Diagnosis not present

## 2017-06-04 DIAGNOSIS — I509 Heart failure, unspecified: Secondary | ICD-10-CM | POA: Diagnosis not present

## 2017-06-04 DIAGNOSIS — R41841 Cognitive communication deficit: Secondary | ICD-10-CM | POA: Diagnosis not present

## 2017-06-04 DIAGNOSIS — E088 Diabetes mellitus due to underlying condition with unspecified complications: Secondary | ICD-10-CM | POA: Diagnosis not present

## 2017-06-08 DIAGNOSIS — R41841 Cognitive communication deficit: Secondary | ICD-10-CM | POA: Diagnosis not present

## 2017-06-08 DIAGNOSIS — E088 Diabetes mellitus due to underlying condition with unspecified complications: Secondary | ICD-10-CM | POA: Diagnosis not present

## 2017-06-08 DIAGNOSIS — I509 Heart failure, unspecified: Secondary | ICD-10-CM | POA: Diagnosis not present

## 2017-06-10 DIAGNOSIS — R41841 Cognitive communication deficit: Secondary | ICD-10-CM | POA: Diagnosis not present

## 2017-06-10 DIAGNOSIS — E088 Diabetes mellitus due to underlying condition with unspecified complications: Secondary | ICD-10-CM | POA: Diagnosis not present

## 2017-06-10 DIAGNOSIS — I509 Heart failure, unspecified: Secondary | ICD-10-CM | POA: Diagnosis not present

## 2017-06-11 DIAGNOSIS — R41841 Cognitive communication deficit: Secondary | ICD-10-CM | POA: Diagnosis not present

## 2017-06-11 DIAGNOSIS — I509 Heart failure, unspecified: Secondary | ICD-10-CM | POA: Diagnosis not present

## 2017-06-11 DIAGNOSIS — E088 Diabetes mellitus due to underlying condition with unspecified complications: Secondary | ICD-10-CM | POA: Diagnosis not present

## 2017-06-12 DIAGNOSIS — F4320 Adjustment disorder, unspecified: Secondary | ICD-10-CM | POA: Diagnosis not present

## 2017-06-15 DIAGNOSIS — I739 Peripheral vascular disease, unspecified: Secondary | ICD-10-CM | POA: Diagnosis not present

## 2017-06-15 DIAGNOSIS — I509 Heart failure, unspecified: Secondary | ICD-10-CM | POA: Diagnosis not present

## 2017-06-15 DIAGNOSIS — E088 Diabetes mellitus due to underlying condition with unspecified complications: Secondary | ICD-10-CM | POA: Diagnosis not present

## 2017-06-15 DIAGNOSIS — I150 Renovascular hypertension: Secondary | ICD-10-CM | POA: Diagnosis not present

## 2017-06-15 DIAGNOSIS — E039 Hypothyroidism, unspecified: Secondary | ICD-10-CM | POA: Diagnosis not present

## 2017-06-15 DIAGNOSIS — R41841 Cognitive communication deficit: Secondary | ICD-10-CM | POA: Diagnosis not present

## 2017-06-16 DIAGNOSIS — E039 Hypothyroidism, unspecified: Secondary | ICD-10-CM | POA: Diagnosis not present

## 2017-06-16 DIAGNOSIS — I480 Paroxysmal atrial fibrillation: Secondary | ICD-10-CM | POA: Diagnosis not present

## 2017-06-16 DIAGNOSIS — I10 Essential (primary) hypertension: Secondary | ICD-10-CM | POA: Diagnosis not present

## 2017-06-16 DIAGNOSIS — E119 Type 2 diabetes mellitus without complications: Secondary | ICD-10-CM | POA: Diagnosis not present

## 2017-06-16 DIAGNOSIS — E088 Diabetes mellitus due to underlying condition with unspecified complications: Secondary | ICD-10-CM | POA: Diagnosis not present

## 2017-06-16 DIAGNOSIS — E785 Hyperlipidemia, unspecified: Secondary | ICD-10-CM | POA: Diagnosis not present

## 2017-06-16 DIAGNOSIS — D649 Anemia, unspecified: Secondary | ICD-10-CM | POA: Diagnosis not present

## 2017-06-17 DIAGNOSIS — R41841 Cognitive communication deficit: Secondary | ICD-10-CM | POA: Diagnosis not present

## 2017-06-17 DIAGNOSIS — I509 Heart failure, unspecified: Secondary | ICD-10-CM | POA: Diagnosis not present

## 2017-06-17 DIAGNOSIS — E088 Diabetes mellitus due to underlying condition with unspecified complications: Secondary | ICD-10-CM | POA: Diagnosis not present

## 2017-06-18 DIAGNOSIS — I509 Heart failure, unspecified: Secondary | ICD-10-CM | POA: Diagnosis not present

## 2017-06-18 DIAGNOSIS — E088 Diabetes mellitus due to underlying condition with unspecified complications: Secondary | ICD-10-CM | POA: Diagnosis not present

## 2017-06-18 DIAGNOSIS — I739 Peripheral vascular disease, unspecified: Secondary | ICD-10-CM | POA: Diagnosis not present

## 2017-06-18 DIAGNOSIS — D500 Iron deficiency anemia secondary to blood loss (chronic): Secondary | ICD-10-CM | POA: Diagnosis not present

## 2017-06-18 DIAGNOSIS — R41841 Cognitive communication deficit: Secondary | ICD-10-CM | POA: Diagnosis not present

## 2017-06-18 DIAGNOSIS — M109 Gout, unspecified: Secondary | ICD-10-CM | POA: Diagnosis not present

## 2017-06-19 ENCOUNTER — Other Ambulatory Visit (HOSPITAL_COMMUNITY): Payer: Self-pay

## 2017-06-19 NOTE — Progress Notes (Signed)
CHP visit to rehab to see when pt may be discharged.  Pt is currently using [place] oxygen due to "fluid retention".  He's using the wheelchair to get around.  The nursing staff stated that they could not give me any information on when pt may be discharged and pt doesn't know.  I will f/u next month for the same.

## 2017-06-22 DIAGNOSIS — I509 Heart failure, unspecified: Secondary | ICD-10-CM | POA: Diagnosis not present

## 2017-06-22 DIAGNOSIS — R41841 Cognitive communication deficit: Secondary | ICD-10-CM | POA: Diagnosis not present

## 2017-06-22 DIAGNOSIS — E088 Diabetes mellitus due to underlying condition with unspecified complications: Secondary | ICD-10-CM | POA: Diagnosis not present

## 2017-06-24 DIAGNOSIS — R41841 Cognitive communication deficit: Secondary | ICD-10-CM | POA: Diagnosis not present

## 2017-06-24 DIAGNOSIS — I509 Heart failure, unspecified: Secondary | ICD-10-CM | POA: Diagnosis not present

## 2017-06-24 DIAGNOSIS — E088 Diabetes mellitus due to underlying condition with unspecified complications: Secondary | ICD-10-CM | POA: Diagnosis not present

## 2017-06-25 DIAGNOSIS — I509 Heart failure, unspecified: Secondary | ICD-10-CM | POA: Diagnosis not present

## 2017-06-25 DIAGNOSIS — R41841 Cognitive communication deficit: Secondary | ICD-10-CM | POA: Diagnosis not present

## 2017-06-25 DIAGNOSIS — E088 Diabetes mellitus due to underlying condition with unspecified complications: Secondary | ICD-10-CM | POA: Diagnosis not present

## 2017-06-26 DIAGNOSIS — E088 Diabetes mellitus due to underlying condition with unspecified complications: Secondary | ICD-10-CM | POA: Diagnosis not present

## 2017-06-26 DIAGNOSIS — I509 Heart failure, unspecified: Secondary | ICD-10-CM | POA: Diagnosis not present

## 2017-06-26 DIAGNOSIS — R41841 Cognitive communication deficit: Secondary | ICD-10-CM | POA: Diagnosis not present

## 2017-07-05 ENCOUNTER — Emergency Department (HOSPITAL_COMMUNITY): Payer: Medicare Other

## 2017-07-05 ENCOUNTER — Inpatient Hospital Stay (HOSPITAL_COMMUNITY)
Admission: EM | Admit: 2017-07-05 | Discharge: 2017-08-07 | DRG: 291 | Disposition: A | Discharge status: Skilled Nursing Facility | Payer: Medicare Other | Attending: Family Medicine | Admitting: Family Medicine

## 2017-07-05 ENCOUNTER — Encounter (HOSPITAL_COMMUNITY): Payer: Self-pay | Admitting: Emergency Medicine

## 2017-07-05 ENCOUNTER — Other Ambulatory Visit: Payer: Self-pay

## 2017-07-05 DIAGNOSIS — R195 Other fecal abnormalities: Secondary | ICD-10-CM | POA: Diagnosis not present

## 2017-07-05 DIAGNOSIS — N179 Acute kidney failure, unspecified: Secondary | ICD-10-CM | POA: Diagnosis present

## 2017-07-05 DIAGNOSIS — G9341 Metabolic encephalopathy: Secondary | ICD-10-CM | POA: Diagnosis not present

## 2017-07-05 DIAGNOSIS — J9600 Acute respiratory failure, unspecified whether with hypoxia or hypercapnia: Secondary | ICD-10-CM | POA: Diagnosis not present

## 2017-07-05 DIAGNOSIS — R042 Hemoptysis: Secondary | ICD-10-CM | POA: Diagnosis not present

## 2017-07-05 DIAGNOSIS — M1A0720 Idiopathic chronic gout, left ankle and foot, without tophus (tophi): Secondary | ICD-10-CM | POA: Diagnosis present

## 2017-07-05 DIAGNOSIS — E870 Hyperosmolality and hypernatremia: Secondary | ICD-10-CM | POA: Diagnosis not present

## 2017-07-05 DIAGNOSIS — Z4682 Encounter for fitting and adjustment of non-vascular catheter: Secondary | ICD-10-CM | POA: Diagnosis not present

## 2017-07-05 DIAGNOSIS — R040 Epistaxis: Secondary | ICD-10-CM | POA: Diagnosis not present

## 2017-07-05 DIAGNOSIS — R0902 Hypoxemia: Secondary | ICD-10-CM | POA: Diagnosis not present

## 2017-07-05 DIAGNOSIS — J449 Chronic obstructive pulmonary disease, unspecified: Secondary | ICD-10-CM

## 2017-07-05 DIAGNOSIS — J189 Pneumonia, unspecified organism: Secondary | ICD-10-CM

## 2017-07-05 DIAGNOSIS — J9691 Respiratory failure, unspecified with hypoxia: Secondary | ICD-10-CM

## 2017-07-05 DIAGNOSIS — E782 Mixed hyperlipidemia: Secondary | ICD-10-CM | POA: Diagnosis present

## 2017-07-05 DIAGNOSIS — E1142 Type 2 diabetes mellitus with diabetic polyneuropathy: Secondary | ICD-10-CM | POA: Diagnosis not present

## 2017-07-05 DIAGNOSIS — N183 Chronic kidney disease, stage 3 unspecified: Secondary | ICD-10-CM

## 2017-07-05 DIAGNOSIS — J80 Acute respiratory distress syndrome: Secondary | ICD-10-CM | POA: Diagnosis not present

## 2017-07-05 DIAGNOSIS — R001 Bradycardia, unspecified: Secondary | ICD-10-CM | POA: Diagnosis not present

## 2017-07-05 DIAGNOSIS — E1165 Type 2 diabetes mellitus with hyperglycemia: Secondary | ICD-10-CM | POA: Diagnosis present

## 2017-07-05 DIAGNOSIS — Z951 Presence of aortocoronary bypass graft: Secondary | ICD-10-CM

## 2017-07-05 DIAGNOSIS — Z0189 Encounter for other specified special examinations: Secondary | ICD-10-CM

## 2017-07-05 DIAGNOSIS — I2510 Atherosclerotic heart disease of native coronary artery without angina pectoris: Secondary | ICD-10-CM | POA: Diagnosis present

## 2017-07-05 DIAGNOSIS — E1122 Type 2 diabetes mellitus with diabetic chronic kidney disease: Secondary | ICD-10-CM | POA: Diagnosis not present

## 2017-07-05 DIAGNOSIS — I472 Ventricular tachycardia: Secondary | ICD-10-CM | POA: Diagnosis not present

## 2017-07-05 DIAGNOSIS — I2721 Secondary pulmonary arterial hypertension: Secondary | ICD-10-CM | POA: Diagnosis not present

## 2017-07-05 DIAGNOSIS — E1151 Type 2 diabetes mellitus with diabetic peripheral angiopathy without gangrene: Secondary | ICD-10-CM | POA: Diagnosis not present

## 2017-07-05 DIAGNOSIS — E11319 Type 2 diabetes mellitus with unspecified diabetic retinopathy without macular edema: Secondary | ICD-10-CM | POA: Diagnosis present

## 2017-07-05 DIAGNOSIS — J90 Pleural effusion, not elsewhere classified: Secondary | ICD-10-CM | POA: Diagnosis not present

## 2017-07-05 DIAGNOSIS — E039 Hypothyroidism, unspecified: Secondary | ICD-10-CM | POA: Diagnosis present

## 2017-07-05 DIAGNOSIS — Z79899 Other long term (current) drug therapy: Secondary | ICD-10-CM

## 2017-07-05 DIAGNOSIS — F419 Anxiety disorder, unspecified: Secondary | ICD-10-CM | POA: Diagnosis not present

## 2017-07-05 DIAGNOSIS — R1310 Dysphagia, unspecified: Secondary | ICD-10-CM | POA: Diagnosis not present

## 2017-07-05 DIAGNOSIS — D649 Anemia, unspecified: Secondary | ICD-10-CM | POA: Diagnosis not present

## 2017-07-05 DIAGNOSIS — I130 Hypertensive heart and chronic kidney disease with heart failure and stage 1 through stage 4 chronic kidney disease, or unspecified chronic kidney disease: Principal | ICD-10-CM | POA: Diagnosis present

## 2017-07-05 DIAGNOSIS — J9690 Respiratory failure, unspecified, unspecified whether with hypoxia or hypercapnia: Secondary | ICD-10-CM

## 2017-07-05 DIAGNOSIS — Z87891 Personal history of nicotine dependence: Secondary | ICD-10-CM

## 2017-07-05 DIAGNOSIS — G467 Other lacunar syndromes: Secondary | ICD-10-CM | POA: Diagnosis not present

## 2017-07-05 DIAGNOSIS — Z85828 Personal history of other malignant neoplasm of skin: Secondary | ICD-10-CM

## 2017-07-05 DIAGNOSIS — R0603 Acute respiratory distress: Secondary | ICD-10-CM

## 2017-07-05 DIAGNOSIS — Z89612 Acquired absence of left leg above knee: Secondary | ICD-10-CM | POA: Diagnosis not present

## 2017-07-05 DIAGNOSIS — R4182 Altered mental status, unspecified: Secondary | ICD-10-CM | POA: Diagnosis not present

## 2017-07-05 DIAGNOSIS — Z4659 Encounter for fitting and adjustment of other gastrointestinal appliance and device: Secondary | ICD-10-CM

## 2017-07-05 DIAGNOSIS — J441 Chronic obstructive pulmonary disease with (acute) exacerbation: Secondary | ICD-10-CM | POA: Diagnosis not present

## 2017-07-05 DIAGNOSIS — D472 Monoclonal gammopathy: Secondary | ICD-10-CM | POA: Diagnosis present

## 2017-07-05 DIAGNOSIS — I361 Nonrheumatic tricuspid (valve) insufficiency: Secondary | ICD-10-CM | POA: Diagnosis not present

## 2017-07-05 DIAGNOSIS — I259 Chronic ischemic heart disease, unspecified: Secondary | ICD-10-CM | POA: Diagnosis not present

## 2017-07-05 DIAGNOSIS — D631 Anemia in chronic kidney disease: Secondary | ICD-10-CM | POA: Diagnosis not present

## 2017-07-05 DIAGNOSIS — R0602 Shortness of breath: Secondary | ICD-10-CM

## 2017-07-05 DIAGNOSIS — Z794 Long term (current) use of insulin: Secondary | ICD-10-CM

## 2017-07-05 DIAGNOSIS — I5033 Acute on chronic diastolic (congestive) heart failure: Secondary | ICD-10-CM | POA: Diagnosis not present

## 2017-07-05 DIAGNOSIS — Z89421 Acquired absence of other right toe(s): Secondary | ICD-10-CM

## 2017-07-05 DIAGNOSIS — I700 Atherosclerosis of aorta: Secondary | ICD-10-CM | POA: Diagnosis present

## 2017-07-05 DIAGNOSIS — D62 Acute posthemorrhagic anemia: Secondary | ICD-10-CM | POA: Diagnosis not present

## 2017-07-05 DIAGNOSIS — L8940 Pressure ulcer of contiguous site of back, buttock and hip, unspecified stage: Secondary | ICD-10-CM | POA: Diagnosis not present

## 2017-07-05 DIAGNOSIS — N184 Chronic kidney disease, stage 4 (severe): Secondary | ICD-10-CM | POA: Diagnosis present

## 2017-07-05 DIAGNOSIS — L8990 Pressure ulcer of unspecified site, unspecified stage: Secondary | ICD-10-CM | POA: Diagnosis present

## 2017-07-05 DIAGNOSIS — Z66 Do not resuscitate: Secondary | ICD-10-CM | POA: Diagnosis not present

## 2017-07-05 DIAGNOSIS — D6489 Other specified anemias: Secondary | ICD-10-CM | POA: Diagnosis present

## 2017-07-05 DIAGNOSIS — J9601 Acute respiratory failure with hypoxia: Secondary | ICD-10-CM

## 2017-07-05 DIAGNOSIS — E874 Mixed disorder of acid-base balance: Secondary | ICD-10-CM | POA: Diagnosis present

## 2017-07-05 DIAGNOSIS — Z452 Encounter for adjustment and management of vascular access device: Secondary | ICD-10-CM | POA: Diagnosis not present

## 2017-07-05 DIAGNOSIS — E639 Nutritional deficiency, unspecified: Secondary | ICD-10-CM | POA: Diagnosis present

## 2017-07-05 DIAGNOSIS — E11649 Type 2 diabetes mellitus with hypoglycemia without coma: Secondary | ICD-10-CM | POA: Diagnosis not present

## 2017-07-05 DIAGNOSIS — R0689 Other abnormalities of breathing: Secondary | ICD-10-CM | POA: Diagnosis not present

## 2017-07-05 DIAGNOSIS — J690 Pneumonitis due to inhalation of food and vomit: Secondary | ICD-10-CM | POA: Diagnosis not present

## 2017-07-05 DIAGNOSIS — E876 Hypokalemia: Secondary | ICD-10-CM | POA: Diagnosis not present

## 2017-07-05 DIAGNOSIS — I10 Essential (primary) hypertension: Secondary | ICD-10-CM | POA: Diagnosis not present

## 2017-07-05 DIAGNOSIS — R451 Restlessness and agitation: Secondary | ICD-10-CM | POA: Diagnosis not present

## 2017-07-05 DIAGNOSIS — Z7901 Long term (current) use of anticoagulants: Secondary | ICD-10-CM

## 2017-07-05 DIAGNOSIS — I509 Heart failure, unspecified: Secondary | ICD-10-CM | POA: Diagnosis not present

## 2017-07-05 DIAGNOSIS — Z7401 Bed confinement status: Secondary | ICD-10-CM | POA: Diagnosis not present

## 2017-07-05 DIAGNOSIS — D696 Thrombocytopenia, unspecified: Secondary | ICD-10-CM | POA: Diagnosis not present

## 2017-07-05 DIAGNOSIS — M2550 Pain in unspecified joint: Secondary | ICD-10-CM | POA: Diagnosis not present

## 2017-07-05 LAB — CBC WITH DIFFERENTIAL/PLATELET
ABS IMMATURE GRANULOCYTES: 0.1 10*3/uL (ref 0.0–0.1)
BASOS ABS: 0 10*3/uL (ref 0.0–0.1)
BASOS PCT: 0 %
Eosinophils Absolute: 0 10*3/uL (ref 0.0–0.7)
Eosinophils Relative: 0 %
HCT: 26.4 % — ABNORMAL LOW (ref 39.0–52.0)
Hemoglobin: 7.8 g/dL — ABNORMAL LOW (ref 13.0–17.0)
IMMATURE GRANULOCYTES: 1 %
Lymphocytes Relative: 4 %
Lymphs Abs: 0.5 10*3/uL — ABNORMAL LOW (ref 0.7–4.0)
MCH: 26.2 pg (ref 26.0–34.0)
MCHC: 29.5 g/dL — ABNORMAL LOW (ref 30.0–36.0)
MCV: 88.6 fL (ref 78.0–100.0)
Monocytes Absolute: 0.8 10*3/uL (ref 0.1–1.0)
Monocytes Relative: 7 %
NEUTROS ABS: 10.7 10*3/uL — AB (ref 1.7–7.7)
NEUTROS PCT: 88 %
PLATELETS: 113 10*3/uL — AB (ref 150–400)
RBC: 2.98 MIL/uL — ABNORMAL LOW (ref 4.22–5.81)
RDW: 18.6 % — ABNORMAL HIGH (ref 11.5–15.5)
WBC: 12.2 10*3/uL — AB (ref 4.0–10.5)

## 2017-07-05 LAB — BASIC METABOLIC PANEL
ANION GAP: 9 (ref 5–15)
BUN: 85 mg/dL — ABNORMAL HIGH (ref 6–20)
CALCIUM: 8.2 mg/dL — AB (ref 8.9–10.3)
CO2: 19 mmol/L — ABNORMAL LOW (ref 22–32)
Chloride: 112 mmol/L — ABNORMAL HIGH (ref 101–111)
Creatinine, Ser: 2.39 mg/dL — ABNORMAL HIGH (ref 0.61–1.24)
GFR calc Af Amer: 31 mL/min — ABNORMAL LOW (ref >60)
GFR calc non Af Amer: 27 mL/min — ABNORMAL LOW (ref >60)
Glucose, Bld: 307 mg/dL — ABNORMAL HIGH (ref 65–99)
Potassium: 4.5 mmol/L (ref 3.5–5.1)
SODIUM: 140 mmol/L (ref 135–145)

## 2017-07-05 LAB — HEMOGLOBIN A1C
Hgb A1c MFr Bld: 6.9 % — ABNORMAL HIGH (ref 4.8–5.6)
Mean Plasma Glucose: 151.33 mg/dL

## 2017-07-05 LAB — I-STAT TROPONIN, ED: TROPONIN I, POC: 0.02 ng/mL (ref 0.00–0.08)

## 2017-07-05 LAB — GLUCOSE, CAPILLARY
Glucose-Capillary: 234 mg/dL — ABNORMAL HIGH (ref 65–99)
Glucose-Capillary: 237 mg/dL — ABNORMAL HIGH (ref 65–99)

## 2017-07-05 LAB — PROTIME-INR
INR: 1.37
PROTHROMBIN TIME: 16.8 s — AB (ref 11.4–15.2)

## 2017-07-05 LAB — BRAIN NATRIURETIC PEPTIDE: B NATRIURETIC PEPTIDE 5: 595.9 pg/mL — AB (ref 0.0–100.0)

## 2017-07-05 MED ORDER — DOXAZOSIN MESYLATE 8 MG PO TABS
8.0000 mg | ORAL_TABLET | Freq: Every day | ORAL | Status: DC
  Administered 2017-07-07: 8 mg via ORAL
  Filled 2017-07-05 (×4): qty 1

## 2017-07-05 MED ORDER — AMLODIPINE BESYLATE 10 MG PO TABS
10.0000 mg | ORAL_TABLET | Freq: Every day | ORAL | Status: DC
  Administered 2017-07-07 – 2017-07-08 (×2): 10 mg via ORAL
  Filled 2017-07-05 (×3): qty 1

## 2017-07-05 MED ORDER — ATORVASTATIN CALCIUM 40 MG PO TABS
40.0000 mg | ORAL_TABLET | Freq: Every day | ORAL | Status: DC
  Administered 2017-07-07 – 2017-07-08 (×2): 40 mg via ORAL
  Filled 2017-07-05 (×2): qty 1

## 2017-07-05 MED ORDER — SODIUM CHLORIDE 0.9% FLUSH: 3.0000 mL | INTRAVENOUS | Status: DC | PRN

## 2017-07-05 MED ORDER — ACETAMINOPHEN 325 MG PO TABS: 650.0000 mg | ORAL_TABLET | ORAL | Status: DC | PRN

## 2017-07-05 MED ORDER — CLONIDINE HCL 0.2 MG/24HR TD PTWK
0.2000 mg | MEDICATED_PATCH | TRANSDERMAL | Status: DC
  Administered 2017-07-06 – 2017-08-02 (×5): 0.2 mg via TRANSDERMAL
  Filled 2017-07-05 (×5): qty 1

## 2017-07-05 MED ORDER — LEVOTHYROXINE SODIUM 100 MCG PO TABS
100.0000 ug | ORAL_TABLET | Freq: Every day | ORAL | Status: DC
Start: 2017-07-05 — End: 2017-07-08
  Administered 2017-07-07 – 2017-07-08 (×2): 100 ug via ORAL
  Filled 2017-07-05 (×2): qty 1

## 2017-07-05 MED ORDER — PANTOPRAZOLE SODIUM 40 MG PO TBEC
40.0000 mg | DELAYED_RELEASE_TABLET | Freq: Every day | ORAL | Status: DC
  Filled 2017-07-05: qty 1

## 2017-07-05 MED ORDER — IPRATROPIUM-ALBUTEROL 0.5-2.5 (3) MG/3ML IN SOLN
3.0000 mL | Freq: Four times a day (QID) | RESPIRATORY_TRACT | Status: DC
  Administered 2017-07-05: 3 mL via RESPIRATORY_TRACT
  Filled 2017-07-05: qty 3

## 2017-07-05 MED ORDER — APIXABAN 2.5 MG PO TABS: 2.5000 mg | ORAL_TABLET | Freq: Two times a day (BID) | ORAL | Status: DC

## 2017-07-05 MED ORDER — SODIUM CHLORIDE 0.9% FLUSH
3.0000 mL | Freq: Two times a day (BID) | INTRAVENOUS | Status: DC
  Administered 2017-07-05 – 2017-07-07 (×4): 3 mL via INTRAVENOUS
  Administered 2017-07-08: 9 mL via INTRAVENOUS
  Administered 2017-07-09 – 2017-07-11 (×5): 3 mL via INTRAVENOUS
  Administered 2017-07-11: 10 mL via INTRAVENOUS
  Administered 2017-07-12 – 2017-07-26 (×19): 3 mL via INTRAVENOUS

## 2017-07-05 MED ORDER — SODIUM CHLORIDE 0.9 % IV SOLN
250.0000 mL | INTRAVENOUS | Status: DC | PRN
  Administered 2017-07-22: 10 mL via INTRAVENOUS

## 2017-07-05 MED ORDER — HYDRALAZINE HCL 20 MG/ML IJ SOLN
10.0000 mg | INTRAMUSCULAR | Status: DC | PRN
  Administered 2017-07-06 (×3): 10 mg via INTRAVENOUS
  Filled 2017-07-05 (×3): qty 1

## 2017-07-05 MED ORDER — IPRATROPIUM-ALBUTEROL 0.5-2.5 (3) MG/3ML IN SOLN: 3.0000 mL | Freq: Four times a day (QID) | RESPIRATORY_TRACT | Status: DC | PRN

## 2017-07-05 MED ORDER — ALLOPURINOL 100 MG PO TABS
100.0000 mg | ORAL_TABLET | Freq: Every day | ORAL | Status: DC
  Administered 2017-07-07 – 2017-07-08 (×2): 100 mg via ORAL
  Filled 2017-07-05 (×3): qty 1

## 2017-07-05 MED ORDER — ONDANSETRON HCL 4 MG/2ML IJ SOLN: 4.0000 mg | Freq: Four times a day (QID) | INTRAMUSCULAR | Status: DC | PRN

## 2017-07-05 MED ORDER — FUROSEMIDE 10 MG/ML IJ SOLN
80.0000 mg | Freq: Once | INTRAMUSCULAR | Status: AC
  Administered 2017-07-05: 80 mg via INTRAVENOUS
  Filled 2017-07-05: qty 8

## 2017-07-05 MED ORDER — FUROSEMIDE 10 MG/ML IJ SOLN
80.0000 mg | Freq: Two times a day (BID) | INTRAMUSCULAR | Status: DC
  Administered 2017-07-05 – 2017-07-07 (×4): 80 mg via INTRAVENOUS
  Filled 2017-07-05 (×5): qty 8

## 2017-07-05 MED ORDER — INSULIN ASPART 100 UNIT/ML ~~LOC~~ SOLN
0.0000 [IU] | Freq: Three times a day (TID) | SUBCUTANEOUS | Status: DC
  Administered 2017-07-05 – 2017-07-06 (×2): 3 [IU] via SUBCUTANEOUS
  Administered 2017-07-06 – 2017-07-07 (×3): 5 [IU] via SUBCUTANEOUS
  Administered 2017-07-07: 2 [IU] via SUBCUTANEOUS
  Administered 2017-07-07: 7 [IU] via SUBCUTANEOUS
  Administered 2017-07-08 (×2): 2 [IU] via SUBCUTANEOUS
  Administered 2017-07-08: 3 [IU] via SUBCUTANEOUS
  Administered 2017-07-09: 2 [IU] via SUBCUTANEOUS

## 2017-07-05 MED ORDER — HYDRALAZINE HCL 50 MG PO TABS
100.0000 mg | ORAL_TABLET | Freq: Three times a day (TID) | ORAL | Status: DC
  Administered 2017-07-07 – 2017-07-08 (×4): 100 mg via ORAL
  Filled 2017-07-05 (×6): qty 2

## 2017-07-05 MED ORDER — CARVEDILOL 25 MG PO TABS
25.0000 mg | ORAL_TABLET | Freq: Two times a day (BID) | ORAL | Status: DC
  Administered 2017-07-07 – 2017-07-08 (×4): 25 mg via ORAL
  Filled 2017-07-05 (×4): qty 1

## 2017-07-05 MED ORDER — ISOSORBIDE MONONITRATE ER 60 MG PO TB24
60.0000 mg | ORAL_TABLET | Freq: Every day | ORAL | Status: DC
  Administered 2017-07-07: 60 mg via ORAL
  Filled 2017-07-05 (×2): qty 1

## 2017-07-05 MED ORDER — CLONIDINE HCL 0.2 MG PO TABS: 0.2000 mg | ORAL_TABLET | Freq: Two times a day (BID) | ORAL | Status: DC

## 2017-07-05 MED ORDER — FERROUS SULFATE 325 (65 FE) MG PO TABS
325.0000 mg | ORAL_TABLET | Freq: Two times a day (BID) | ORAL | Status: DC
  Administered 2017-07-07 – 2017-07-08 (×4): 325 mg via ORAL
  Filled 2017-07-05 (×4): qty 1

## 2017-07-05 NOTE — ED Triage Notes (Signed)
To ED via [facility] from nsg home on [address]-- pt in resp distress-- lungs crackles throughout-- pt is predialysis with graft in left upper arm.  Hx AKA left leg-

## 2017-07-05 NOTE — H&P (Signed)
[facility] Admission History and Physical Service Pager: [phone]  [name] Medical record number: [id number] Date of birth: 05/04/1950 Age: 67 y.o. Gender: male  Primary Care Provider: [name], MD Consultants: none Code Status: Full  Chief Complaint: shortness of breath  Assessment and Plan: Leonard [name] is a 67 y.o. male presenting with SOB. PMH is significant for COPD, CKD stage 4, CAD s/p CABG, T2DM, HTN, HLD.  Acute hypoxic respiratory failure with shortness of breath. Patient requiring BiPAP. Likely secondary to CHF exacerbation.  Last echo 06/05/2015 showed EF of 60 to 65% with grade 2 diastolic dysfunction.  BNP elevated at 595.9.   Unknown dry weight as 175 pounds on admission, was 163 in March, and was in the 200s prior to that.  Differential includes pneumonia given WBC 12.2 however afebrile and no cough.  Chest x-ray showing pulmonary edema with small to moderate right effusion more consistent with CHF etiology however does have worsening bilateral mid to lower lung opacities that could be an infiltrate. Also possible that COPD exacerbation however patient did not report wheezing. Will monitor off antibiotics at this time.  - Admit to SDU, attending Dr. Gwendlyn Deutscher - monitor on telemetry - strict I/Os, daily weights - IV lasix 98m BID - am EKG - echo - coreg 259mBID, imdur 6050md, hydralazine 100m46m - prn duonebs  T2DM. Last a1c 6.9 on 01/05/17 - sSSI - monitor CBGs - a1c pending  Hypertension. BP on admit 167/66. At home on amlodipine 10mg42mly, Coreg 50 mg twice daily, clonidine 0.2 mg twice daily, doxazosin 8 mg daily at bedtime, hydralazine 100 mg every 8 hours, Imdur 60 mg daily, torsemide 20 mg twice daily. - Continue home amlodipine, Coreg, clonidine, doxazosin, hydralazine, Imdur. - IV diuresis as per above - monitor BPs  CKD IV. SCr 2.39 (baseline 2.7). Follows with Dr. ColadMarval Regal been  undergoing preparations to start dialysis but both patient and sister report that he has not needed to and continues to make urine. - Monitor kidney fuction  Hypothyroidism. Last TSH 7.4 on 02/11/17 - Continue home levothyroxine 100mcg8m- check TSH  HLD. Last LDL 46 on 09/28/15 - continue home atorvastatin 40 mg daily -Lipid panel pending  H/o DVT on Eliquis at home - continue eliquis   FEN/GI: NPO while on BiPAP Prophylaxis: eliquis  Disposition: admit to SDU  History of Present Illness:  Leonard LYBRAND67 y.o49male presenting with shortness of breath. Patient and sister are historians.  Patient states that over the last couple of weeks he has noticed some increased swelling in his legs despite taking his home torsemide.  His sister states that she noticed he started becoming a little short of breath over the weekend.  He became acutely short of breath this morning and was brought into the emergency room via EMS in respiratory distress.  History is limited as patient is on BiPAP and only able to speak in short sentences.  He denies fever or cough.  He states that this feels very different from the last time he had pneumonia.  He has difficulty laying flat due to his breathing.  No recent illnesses, no missed medication doses of his diuretic.  No recent increased salt intake.  Denies chest pain, palpitations, abdominal pain, urinary symptoms.  He says that his torsemide makes him urinate large amounts.  He is in the process of getting ready for dialysis but has not needed  to start.  Review Of Systems: Per HPI with the following additions:   Review of Systems  Constitutional: Negative for chills and fever.  Eyes: Negative for blurred vision and double vision.  Respiratory: Positive for shortness of breath. Negative for cough and sputum production.   Cardiovascular: Positive for orthopnea and leg swelling. Negative for chest pain and palpitations.  Gastrointestinal: Negative for  abdominal pain, constipation, diarrhea, nausea and vomiting.  Genitourinary: Negative for dysuria, frequency and urgency.  Neurological: Negative for focal weakness.    Patient Active Problem List   Diagnosis Date Noted  . Acute exacerbation of CHF (congestive heart failure) ([place]) 07/05/2017  . Atherosclerosis of aorta with gangrene ([place]) 03/12/2017  . Atherosclerotic peripheral vascular disease with gangrene ([place]) 02/14/2017  . Encounter for vitamin deficiency screening 02/11/2017  . Collapse of left talus 01/14/2017  . Diabetic polyneuropathy associated with type 2 diabetes mellitus ([place]) 01/14/2017  . Type 2 diabetes mellitus with pressure callus ([place]) 12/29/2016  . Idiopathic chronic gout of left foot without tophus   . Left foot pain 10/29/2016  . Long term (current) use of anticoagulants [Z79.01] 09/09/2016  . Chronic deep vein thrombosis (DVT) of femoral vein of left lower extremity ([place]) 09/03/2016  . Morbid obesity due to excess calories ([place]) 03/17/2016  . Anemia due to stage 4 chronic kidney disease treated with darbepoetin ([place]) 02/28/2016  . Fatigue associated with anemia 01/21/2016  . Diabetic retinopathy ([place]) 11/13/2015  . CHF (congestive heart failure) ([place]) 08/27/2015  . Iron deficiency anemia 08/03/2015  . Claudication ([place])   . Diabetes ([place])   . Hematuria 06/27/2015  . Skin lesion of face 02/22/2015  . 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 Deficiency anemia 01/15/2015  . CKD (chronic kidney disease) stage 4, GFR 15-29 ml/min (H[facility]) 01/15/2015  . Hypothyroidism 11/22/2014  . COPD GOLD III  02/16/2014  . History of tobacco use 08/23/2013  . 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 CAD (coronary artery disease) 04/06/2013  . PVD - hx of Rt SFA PTA and s/p Rt 4-5th toe amp Dec 2014 04/05/2013  . Essential hypertension 11/25/2012  . Mixed hyperlipidemia 07/17/2008    Past Medical History: Past Medical History:  Diagnosis Date  . Cancer ([place])     skin cancer-  . CHF (congestive heart failure) ([place])   . Chronic kidney disease    not on dialysis  . Coronary artery disease   . Gangrene of toe ([place])    right 5th/notes 01/04/2013   . High cholesterol   . Hypertension   . Iron deficiency anemia 08/03/2015  . 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 PAD (peripheral artery disease) ([place])   . Shortness of breath dyspnea    with exertion  . Type II diabetes mellitus ([place])    Type II    Past Surgical History: Past Surgical History:  Procedure Laterality Date  . ABDOMINAL AORTOGRAM W/LOWER EXTREMITY N/A 03/12/2017   Procedure: ABDOMINAL AORTOGRAM W/LOWER EXTREMITY;  Surgeon: Conrad Marion, MD;  Location: [facility];  Service: Cardiovascular;  Laterality: N/A;  . AMPUTATION Right 01/28/2013   Procedure: RAY AMPUTATION RIGHT  4th & 5th TOE;  Surgeon: Rosetta Posner, MD;  Location: [facility];  Service: Vascular;  Laterality: Right;  . AMPUTATION Left 03/15/2017   Procedure: AMPUTATION ABOVE KNEE;  Surgeon: Waynetta Sandy, MD;  Location: [facility];  Service: Vascular;  Laterality: Left;  . ANGIOPLASTY / STENTING FEMORAL Right 01/13/2013  superficial femoral artery x 2 (3 mm x 60 mm, 4 mm x 60 mm)  . AV FISTULA PLACEMENT Left 02/11/2016   Procedure: LEFT UPPER ARM BRACHIAL CEPHALIC ARTERIOVENOUS (AV) FISTULA CREATION;  Surgeon: Conrad Mathews, MD;  Location: [facility];  Service: Vascular;  Laterality: Left;  . COLONOSCOPY W/ POLYPECTOMY    . CORONARY ARTERY BYPASS GRAFT N/A 04/07/2013   Procedure: CORONARY ARTERY BYPASS GRAFTING (CABG);  Surgeon: Ivin Poot, MD;  Location: [facility];  Service: Open Heart Surgery;  Laterality: N/A;  . INTRAOPERATIVE TRANSESOPHAGEAL ECHOCARDIOGRAM N/A 04/07/2013   Procedure: INTRAOPERATIVE TRANSESOPHAGEAL ECHOCARDIOGRAM;  Surgeon: Ivin Poot, MD;  Location: [facility];  Service: Open Heart Surgery;  Laterality: N/A;  . LEFT HEART CATHETERIZATION WITH CORONARY ANGIOGRAM N/A 04/05/2013    Procedure: LEFT HEART CATHETERIZATION WITH CORONARY ANGIOGRAM;  Surgeon: Peter M Martinique, MD;  Location: [facility] CATH LAB;  Service: Cardiovascular;  Laterality: N/A;  . LOWER EXTREMITY ANGIOGRAM Bilateral 01/06/2013   Procedure: LOWER EXTREMITY ANGIOGRAM;  Surgeon: Conrad Ewa Gentry, MD;  Location: [facility] CATH LAB;  Service: Cardiovascular;  Laterality: Bilateral;  . LOWER EXTREMITY ANGIOGRAM Right 01/13/2013   Procedure: LOWER EXTREMITY ANGIOGRAM;  Surgeon: Conrad Colleton, MD;  Location: [facility] CATH LAB;  Service: Cardiovascular;  Laterality: Right;  . SKIN CANCER EXCISION  2017  . THROAT SURGERY  1983   polyps  . TONSILLECTOMY AND ADENOIDECTOMY  ~ 1965    Social History: Social History   Tobacco Use  . Smoking status: Former Smoker    Packs/day: 1.00    Years: 46.00    Pack years: 46.00    Types: Cigarettes    Start date: 02/04/1967    Last attempt to quit: 02/03/2013    Years since quitting: 4.4  . Smokeless tobacco: Never Used  . Tobacco comment: Doing well with quitting.  Substance Use Topics  . Alcohol use: No    Alcohol/week: 0.0 oz  . Drug use: No   Additional social history: Lives at [place]. Former smoker quit 5-6 years ago. No EtOH or recreational drug use.  Please also refer to relevant sections of EMR.  Family History: Family History  Problem Relation Age of Onset  . Cancer Mother        lung cancer  . Hypertension Mother   . Cancer Sister        patient thinks it was uterine cancer  . Hypertension Sister   . Heart attack Sister   . Stroke Neg Hx     Allergies and Medications: No Known Allergies Current Facility-Administered Medications on File Prior to Encounter  Medication Dose Route Frequency Provider Last Rate Last Dose  . sodium chloride flush (NS) 0.9 % injection 10 mL  10 mL Intracatheter PRN Heath Lark, MD       Current Outpatient Medications on File Prior to Encounter  Medication Sig Dispense Refill  . amLODipine (NORVASC) 10 MG tablet Take 1 tablet (10 mg total)  by mouth daily. Needs office visit for further refills 30 tablet 0  . atorvastatin (LIPITOR) 40 MG tablet TAKE ONE (1) TABLET BY MOUTH EVERY DAY AT 6:00PM (Patient taking differently: Take 40 mg by mouth once a day at 6 PM) 90 tablet 3  . Blood Glucose Monitoring Suppl (ONETOUCH VERIO FLEX SYSTEM) w/Device KIT 1 kit by Does not apply route daily. 1 kit 3  . carvedilol (COREG) 25 MG tablet TAKE TWO (2) TABLETS BY MOUTH 2 TIMES DAILY WITH A MEAL (Patient taking differently: TAKE TWO  TABLETS (21m) BY MOUTH 2 TIMES DAILY WITH A MEAL) 120 tablet 3  . cloNIDine (CATAPRES) 0.2 MG tablet TAKE TWO (2) TABLETS BY MOUTH 2 TIMES DAILY 120 tablet 3  . doxazosin (CARDURA) 8 MG tablet Take 1 tablet (8 mg total) by mouth every evening. (Patient taking differently: Take 8 mg by mouth at bedtime. )    . FEROSUL 325 (65 Fe) MG tablet TAKE ONE (1) TABLET BY MOUTH TWO (2) TIMES DAILY WITH A MEAL (Patient taking differently: TAKE 325 MG BY MOUTH TWO (2) TIMES DAILY WITH A MEAL) 60 tablet 3  . glucose blood (ONETOUCH VERIO) test strip Use as instructed to test three times daily. ICD-10 code: E11.22 100 each 12  . hydrALAZINE (APRESOLINE) 100 MG tablet TAKE ONE TABLET BY MOUTH EVERY EIGHT HOURS (Patient taking differently: TAKE 1 TABLET (100 MG) BY MOUTH TWICE DAILY) 90 tablet 1  . insulin aspart (NOVOLOG FLEXPEN) 100 UNIT/ML FlexPen Inject 15 Units 2 (two) times daily before lunch and supper into the skin. 15 mL 5  . isosorbide mononitrate (IMDUR) 60 MG 24 hr tablet TAKE ONE (1) TABLET BY MOUTH EVERY DAY (Patient taking differently: TAKE ONE TABLET (634m BY MOUTH EVERY DAY) 30 tablet 3  . levothyroxine (SYNTHROID, LEVOTHROID) 75 MCG tablet Take 1 tablet (75 mcg total) by mouth daily. 90 tablet 1  . nitroGLYCERIN (NITROSTAT) 0.4 MG SL tablet Place 1 tablet (0.4 mg total) under the tongue every 5 (five) minutes as needed for chest pain. 30 tablet 12  . ONETOUCH DELICA LANCETS 3370WISC 1 each by Does not apply route 3 (three)  times daily. ICD-10 code: E11.22 100 each 12  . oxyCODONE (OXY IR/ROXICODONE) 5 MG immediate release tablet Take 1 tablet (5 mg total) by mouth every 6 (six) hours as needed for moderate pain. 30 tablet 0  . pantoprazole (PROTONIX) 40 MG tablet TAKE ONE (1) TABLET BY MOUTH EVERY DAY (Patient taking differently: TAKE ONE TABLET (4067mBY MOUTH EVERY DAY) 90 tablet 0  . potassium chloride SA (K-DUR,KLOR-CON) 20 MEQ tablet Take 20 mEq by mouth 2 (two) times daily.     . tMarland Kitchenrsemide (DEMADEX) 20 MG tablet Take 3 tablets (60 mg total) by mouth daily. May take an extra 20 mg Daily for weight gain above 212 lbs (Patient taking differently: Take 60 mg by mouth daily. And may take an extra 20 mg daily for weight gain above 212 lbs) 90 tablet 6  . traMADol (ULTRAM) 50 MG tablet Take 1 tablet (50 mg total) by mouth every 6 (six) hours as needed for moderate pain. 60 tablet 0  . warfarin (COUMADIN) 5 MG tablet Take 2.5 mg (1/2 tablet) daily except 5 mg (1 tablet) on Monday and Friday (Patient taking differently: Take 2.5-5 mg by mouth See admin instructions. Take 5 mg by mouth daily on Friday. Take 2.5 mg by mouth daily on all other days) 18 tablet 1  . [DISCONTINUED] Insulin Glargine (LANTUS SOLOSTAR) 100 UNIT/ML Solostar Pen Inject 50 Units into the skin every morning. (Patient taking differently: Inject 50 Units into the skin daily after breakfast. ) 15 mL 3    Objective: BP (!) 167/66 (BP Location: Right Arm)   Pulse 61   Temp (!) 96.3 F (35.7 C) (Rectal)   Resp 18   Ht '5\' 8"'  (1.727 m)   Wt 175 lb (79.4 kg)   SpO2 98%   BMI 26.61 kg/m  Exam: General:  Sitting up in bed, drowsy but easily arousable to  voice Eyes: PERRL, EOMI. ENTM: BiPAP in place Neck: supple, no lymphadenopathy Cardiovascular: regular, normal S1 and S2. No murmurs.  Respiratory: coarse breath sounds throughout with crackles, tachypneic on bipap Gastrointestinal: soft, nontender, nondistended, + bowel sounds MSK: L AKA, 2+ pitting  edema to hips bilaterally. 2+ DP and PT pulses in R foot Derm: warm and dry. R foot with plantar callus. Neuro: drowsy but easily arousable to voice, follows commands, responds appropriately, moving all limbs equally Psych: appropriate affect   Labs and Imaging: CBC BMET  Recent Labs  Lab 07/05/17 1036  WBC 12.2*  HGB 7.8*  HCT 26.4*  PLT 113*   Recent Labs  Lab 07/05/17 1036  NA 140  K 4.5  CL 112*  CO2 19*  BUN 85*  CREATININE 2.39*  GLUCOSE 307*  CALCIUM 8.2*     Troponin (Point of Care Test) Recent Labs    07/05/17 1048  TROPIPOC 0.02    BNP    Component Value Date/Time   BNP 595.9 (H) 07/05/2017 1036   BNP 224.5 (H) 01/09/2016 1606    ProBNP    Component Value Date/Time   PROBNP 2,795.0 (H) 10-10-2013 2105    Dg Chest Port 1 View  Result Date: 07/05/2017 CLINICAL DATA:  Shortness of breath EXAM: PORTABLE CHEST 1 VIEW COMPARISON:  03/21/2016; 01/21/2016; 02/05/2015 FINDINGS: Grossly unchanged enlarged cardiac silhouette and mediastinal contours post median sternotomy and CABG. The pulmonary vasculature appears indistinct with cephalization of flow. Small to moderate-sized right-sided pleural effusion with associated right basilar consolidative opacities. Slight worsening of left mid and lower lung heterogeneous potential airspace opacities. No definite left-sided pleural effusion. No pneumothorax. No definite acute osseus abnormalities. Vascular calcifications overlie right axilla and lower neck bilaterally. IMPRESSION: Constellation of findings most worrisome for pulmonary edema with small to moderate-sized right-sided effusion and associated worsening bilateral mid and lower lung heterogeneous/consolidative opacities, atelectasis versus infiltrate. Electronically Signed   By: Sandi Mariscal M.D.   On: 07/05/2017 10:40    Bufford Lope, DO 07/05/2017, 12:06 PM PGY-2, [place] Intern pager: [phone], text pages welcome

## 2017-07-05 NOTE — Progress Notes (Signed)
Pt transported to 6E26 via v60 bipap w/ no apparent complications.

## 2017-07-05 NOTE — ED Provider Notes (Signed)
[facility] EMERGENCY DEPARTMENT Provider Note   CSN: [id number] Arrival date & time: 07/05/17  1021     History   Chief Complaint Chief Complaint  Patient presents with  . Respiratory Distress    HPI Leonard [name] is a 67 y.o. male.  Pt presents to the ED today with sob.  The pt is at a SNF and is on home O2.  He has CKD, but is not yet on dialysis.   He does have an AV fistula in anticipation for the need of dialysis.  The sob was worse than usual today with his o2 sats low on his normal oxygen.  Pt is a poor historian due to the sob.  No hx of fever.     Past Medical History:  Diagnosis Date  . Cancer ([place])    skin cancer-  . CHF (congestive heart failure) ([place])   . Chronic kidney disease    not on dialysis  . Coronary artery disease   . Gangrene of toe ([place])    right 5th/notes 01/04/2013   . High cholesterol   . Hypertension   . Iron deficiency anemia 08/03/2015  . 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 PAD (peripheral artery disease) ([place])   . Shortness of breath dyspnea    with exertion  . Type II diabetes mellitus ([place])    Type II    Patient Active Problem List   Diagnosis Date Noted  . Atherosclerosis of aorta with gangrene ([place]) 03/12/2017  . Atherosclerotic peripheral vascular disease with gangrene ([place]) 02/14/2017  . Encounter for vitamin deficiency screening 02/11/2017  . Collapse of left talus 01/14/2017  . Diabetic polyneuropathy associated with type 2 diabetes mellitus ([place]) 01/14/2017  . Type 2 diabetes mellitus with pressure callus ([place]) 12/29/2016  . Idiopathic chronic gout of left foot without tophus   . Left foot pain 10/29/2016  . Long term (current) use of anticoagulants [Z79.01] 09/09/2016  . Chronic deep vein thrombosis (DVT) of femoral vein of left lower extremity ([place]) 09/03/2016  . Morbid obesity due to excess calories ([place]) 03/17/2016  . Anemia due to stage 4 chronic kidney disease  treated with darbepoetin ([place]) 02/28/2016  . Fatigue associated with anemia 01/21/2016  . Diabetic retinopathy ([place]) 11/13/2015  . CHF (congestive heart failure) ([place]) 08/27/2015  . Iron deficiency anemia 08/03/2015  . Claudication ([place])   . Diabetes ([place])   . Hematuria 06/27/2015  . Skin lesion of face 02/22/2015  . 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 Deficiency anemia 01/15/2015  . CKD (chronic kidney disease) stage 4, GFR 15-29 ml/min (HCC) 01/15/2015  . Hypothyroidism 11/22/2014  . COPD GOLD III  02/16/2014  . History of tobacco use 08/23/2013  . 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 CAD (coronary artery disease) 04/06/2013  . PVD - hx of Rt SFA PTA and s/p Rt 4-5th toe amp Dec 2014 04/05/2013  . Essential hypertension 11/25/2012  . Mixed hyperlipidemia 07/17/2008    Past Surgical History:  Procedure Laterality Date  . ABDOMINAL AORTOGRAM W/LOWER EXTREMITY N/A 03/12/2017   Procedure: ABDOMINAL AORTOGRAM W/LOWER EXTREMITY;  Surgeon: Conrad Hazen, MD;  Location: [facility];  Service: Cardiovascular;  Laterality: N/A;  . AMPUTATION Right 01/28/2013   Procedure: RAY AMPUTATION RIGHT  4th & 5th TOE;  Surgeon: Rosetta Posner, MD;  Location: [facility];  Service: Vascular;  Laterality: Right;  . AMPUTATION Left 03/15/2017   Procedure: AMPUTATION ABOVE KNEE;  Surgeon: Waynetta Sandy, MD;  Location: [facility] OR;  Service: Vascular;  Laterality: Left;  . ANGIOPLASTY / STENTING FEMORAL Right 01/13/2013   superficial femoral artery x 2 (3 mm x 60 mm, 4 mm x 60 mm)  . AV FISTULA PLACEMENT Left 02/11/2016   Procedure: LEFT UPPER ARM BRACHIAL CEPHALIC ARTERIOVENOUS (AV) FISTULA CREATION;  Surgeon: Conrad Sharon Hill, MD;  Location: [facility];  Service: Vascular;  Laterality: Left;  . COLONOSCOPY W/ POLYPECTOMY    . CORONARY ARTERY BYPASS GRAFT N/A 04/07/2013   Procedure: CORONARY ARTERY BYPASS GRAFTING (CABG);  Surgeon: Ivin Poot, MD;  Location: [facility];  Service: Open Heart  Surgery;  Laterality: N/A;  . INTRAOPERATIVE TRANSESOPHAGEAL ECHOCARDIOGRAM N/A 04/07/2013   Procedure: INTRAOPERATIVE TRANSESOPHAGEAL ECHOCARDIOGRAM;  Surgeon: Ivin Poot, MD;  Location: [facility];  Service: Open Heart Surgery;  Laterality: N/A;  . LEFT HEART CATHETERIZATION WITH CORONARY ANGIOGRAM N/A 04/05/2013   Procedure: LEFT HEART CATHETERIZATION WITH CORONARY ANGIOGRAM;  Surgeon: Peter M Martinique, MD;  Location: [facility] CATH LAB;  Service: Cardiovascular;  Laterality: N/A;  . LOWER EXTREMITY ANGIOGRAM Bilateral 01/06/2013   Procedure: LOWER EXTREMITY ANGIOGRAM;  Surgeon: Conrad Lyles, MD;  Location[facility];  Service: Cardiovascular;  Laterality: Bilateral;  . LOWER EXTREMITY ANGIOGRAM Right 01/13/2013   Procedure: LOWER EXTREMITY ANGIOGRAM;  Surgeon: Conrad [place], MD;  Location: [facility] CATH LAB;  Service: Cardiovascular;  Laterality: Right;  . SKIN CANCER EXCISION  2017  . THROAT SURGERY  1983   polyps  . TONSILLECTOMY AND ADENOIDECTOMY  ~ 1965        Home Medications    Prior to Admission medications   Medication Sig Start Date End Date Taking? Authorizing Provider  amLODipine (NORVASC) 10 MG tablet Take 1 tablet (10 mg total) by mouth daily. Needs office visit for further refills 04/08/17   Bensimhon, Shaune Pascal, MD  atorvastatin (LIPITOR) 40 MG tablet TAKE ONE (1) TABLET BY MOUTH EVERY DAY AT 6:00PM Patient taking differently: Take 40 mg by mouth once a day at 6 PM 11/17/16   Larey Dresser, MD  Blood Glucose Monitoring Suppl ([place]) w/Device KIT 1 kit by Does not apply route daily. 10/10/16   McDiarmid, Blane Ohara, MD  carvedilol (COREG) 25 MG tablet TAKE TWO (2) TABLETS BY MOUTH 2 TIMES DAILY WITH A MEAL Patient taking differently: TAKE TWO TABLETS (15m) BY MOUTH 2 TIMES DAILY WITH A MEAL 01/14/17   Bensimhon, DShaune Pascal MD  cloNIDine (CATAPRES) 0.2 MG tablet TAKE TWO (2) TABLETS BY MOUTH 2 TIMES DAILY 11/11/16   Bensimhon, DShaune Pascal MD  doxazosin (CARDURA) 8 MG tablet  Take 1 tablet (8 mg total) by mouth every evening. Patient taking differently: Take 8 mg by mouth at bedtime.  12/01/16   HZenia Resides MD  FEROSUL 325 (65 Fe) MG tablet TAKE ONE (1) TABLET BY MOUTH TWO (2) TIMES DAILY WITH A MEAL Patient taking differently: TAKE 325 MG BY MOUTH TWO (2) TIMES DAILY WITH A MEAL 03/03/17   GCarlyle Dolly MD  glucose blood (ONETOUCH VERIO) test strip Use as instructed to test three times daily. ICD-10 code: E11.22 10/15/16   McDiarmid, TBlane Ohara MD  hydrALAZINE (APRESOLINE) 100 MG tablet TAKE ONE TABLET BY MOUTH EVERY EIGHT HOURS Patient taking differently: TAKE 1 TABLET (100 MG) BY MOUTH TWICE DAILY 11/14/16   GCarlyle Dolly MD  insulin aspart (NOVOLOG FLEXPEN) 100 UNIT/ML FlexPen Inject 15 Units 2 (two) times daily before lunch  and supper into the skin. 12/15/16   Zenia Resides, MD  isosorbide mononitrate (IMDUR) 60 MG 24 hr tablet TAKE ONE (1) TABLET BY MOUTH EVERY DAY Patient taking differently: TAKE ONE TABLET (18m) BY MOUTH EVERY DAY 01/14/17   Bensimhon, DShaune Pascal MD  levothyroxine (SYNTHROID, LEVOTHROID) 75 MCG tablet Take 1 tablet (75 mcg total) by mouth daily. 12/31/16   GCarlyle Dolly MD  nitroGLYCERIN (NITROSTAT) 0.4 MG SL tablet Place 1 tablet (0.4 mg total) under the tongue every 5 (five) minutes as needed for chest pain. 11/14/16   GCarlyle Dolly MD  OStat[facility]Con[facility] DivDELICA LANCETS 394BMISC 1 each by Does not apply route 3 (three) times daily. ICD-10 code: E11.22 10/15/16   McDiarmid, TBlane Ohara MD  oxyCODONE (OXY IR/ROXICODONE) 5 MG immediate release tablet Take 1 tablet (5 mg total) by mouth every 6 (six) hours as needed for moderate pain. 03/17/17   CUlyses Amor PA-C  pantoprazole (PROTONIX) 40 MG tablet TAKE ONE (1) TABLET BY MOUTH EVERY DAY Patient taking differently: TAKE ONE TABLET (428m BY MOUTH EVERY DAY 01/13/17   GaCarlyle DollyMD  potassium chloride SA (K-DUR,KLOR-CON) 20 MEQ tablet Take 20 mEq by mouth 2 (two)  times daily.     [provider]  torsemide (DEMADEX) 20 MG tablet Take 3 tablets (60 mg total) by mouth daily. May take an extra 20 mg Daily for weight gain above 212 lbs Patient taking differently: Take 60 mg by mouth daily. And may take an extra 20 mg daily for weight gain above 212 lbs 09/17/16   SmArbutus LeasNP  traMADol (ULTRAM) 50 MG tablet Take 1 tablet (50 mg total) by mouth every 6 (six) hours as needed for moderate pain. 02/05/17   DuNewt MinionMD  warfarin (COUMADIN) 5 MG tablet Take 2.5 mg (1/2 tablet) daily except 5 mg (1 tablet) on Monday and Friday Patient taking differently: Take 2.5-5 mg by mouth See admin instructions. Take 5 mg by mouth daily on Friday. Take 2.5 mg by mouth daily on all other days 02/25/17   McLarey DresserMD  Insulin Glargine (LANTUS SOLOSTAR) 100 UNIT/ML Solostar Pen Inject 50 Units into the skin every morning. Patient taking differently: Inject 50 Units into the skin daily after breakfast.  10/10/16 04/23/17  GaCarlyle DollyMD    Family History Family History  Problem Relation Age of Onset  . Cancer Mother        lung cancer  . Hypertension Mother   . Cancer Sister        patient thinks it was uterine cancer  . Hypertension Sister   . Heart attack Sister   . Stroke Neg Hx     Social History Social History   Tobacco Use  . Smoking status: Former Smoker    Packs/day: 1.00    Years: 46.00    Pack years: 46.00    Types: Cigarettes    Start date: 02/04/1967    Last attempt to quit: 02/03/2013    Years since quitting: 4.4  . Smokeless tobacco: Never Used  . Tobacco comment: Doing well with quitting.  Substance Use Topics  . Alcohol use: No    Alcohol/week: 0.0 oz  . Drug use: No     Allergies   Patient has no known allergies.   Review of Systems Review of Systems  Respiratory: Positive for shortness of breath.   All other systems reviewed and are negative.    Physical Exam  Updated Vital Signs BP (!) 167/66 (BP  Location: Right Arm)   Pulse 61   Temp (!) 96.3 F (35.7 C) (Rectal)   Resp 18   Ht _0  (1.727 m)   Wt 79.4 kg (175 lb)   SpO2 98%   BMI 26.61 kg/m   Physical Exam  Constitutional: He is oriented to person, place, and time. He appears well-developed. He appears distressed.  HENT:  Head: Normocephalic and atraumatic.  Right Ear: External ear normal.  Left Ear: External ear normal.  Nose: Nose normal.  Mouth/Throat: Oropharynx is clear and moist.  Eyes: Pupils are equal, round, and reactive to light. Conjunctivae and EOM are normal.  Neck: Normal range of motion. Neck supple.  Cardiovascular: Normal rate, regular rhythm, normal heart sounds and intact distal pulses.  Pulmonary/Chest: Tachypnea noted. He is in respiratory distress. He has rhonchi. He has rales.  Abdominal: Soft. Bowel sounds are normal.  Musculoskeletal: He exhibits edema.  Chronic wounds right foot Left aka  Left ac fossa av fistula with good thrill  Neurological: He is alert and oriented to person, place, and time.  Skin: Skin is warm. Capillary refill takes less than 2 seconds.  Psychiatric: He has a normal mood and affect. His behavior is normal. Judgment and thought content normal.  Nursing note and vitals reviewed.    ED Treatments / Results  Labs (all labs ordered are listed, but only abnormal results are displayed) Labs Reviewed  BASIC METABOLIC PANEL - Abnormal; Notable for the following components:      Result Value   Chloride 112 (*)    CO2 19 (*)    Glucose, Bld 307 (*)    BUN 85 (*)    Creatinine, Ser 2.39 (*)    Calcium 8.2 (*)    GFR calc non Af Amer 27 (*)    GFR calc Af Amer 31 (*)    All other components within normal limits  CBC WITH DIFFERENTIAL/PLATELET - Abnormal; Notable for the following components:   WBC 12.2 (*)    RBC 2.98 (*)    Hemoglobin 7.8 (*)    HCT 26.4 (*)    MCHC 29.5 (*)    RDW 18.6 (*)    Platelets 113 (*)    Neutro Abs 10.7 (*)    Lymphs Abs 0.5 (*)     All other components within normal limits  BRAIN NATRIURETIC PEPTIDE - Abnormal; Notable for the following components:   B Natriuretic Peptide 595.9 (*)    All other components within normal limits  PROTIME-INR - Abnormal; Notable for the following components:   Prothrombin Time 16.8 (*)    All other components within normal limits  I-STAT TROPONIN, ED    EKG EKG Interpretation  Date/Time:  Sunday July 05 2017 10:32:39 EDT Ventricular Rate:  64 PR Interval:    QRS Duration: 118 QT Interval:  446 QTC Calculation: 453 R Axis:   35 Text Interpretation:  Sinus rhythm Incomplete left bundle branch block ST elevation suggests acute pericarditis Baseline wander in lead(s) V3 Poor data quality Confirmed by Isla Pence [phone]) on 07/05/2017 10:40:34 AM   Radiology Dg Chest Port 1 View  Result Date: 07/05/2017 CLINICAL DATA:  Shortness of breath EXAM: PORTABLE CHEST 1 VIEW COMPARISON:  03/21/2016; 01/21/2016; 02/05/2015 FINDINGS: Grossly unchanged enlarged cardiac silhouette and mediastinal contours post median sternotomy and CABG. The pulmonary vasculature appears indistinct with cephalization of flow. Small to moderate-sized right-sided pleural effusion with associated right basilar consolidative opacities. Slight worsening  of left mid and lower lung heterogeneous potential airspace opacities. No definite left-sided pleural effusion. No pneumothorax. No definite acute osseus abnormalities. Vascular calcifications overlie right axilla and lower neck bilaterally. IMPRESSION: Constellation of findings most worrisome for pulmonary edema with small to moderate-sized right-sided effusion and associated worsening bilateral mid and lower lung heterogeneous/consolidative opacities, atelectasis versus infiltrate. Electronically Signed   By: Sandi Mariscal M.D.   On: 07/05/2017 10:40    Procedures Procedures (including critical care time)  Medications Ordered in ED Medications  furosemide (LASIX)  injection 80 mg (has no administration in time range)     Initial Impression / Assessment and Plan / ED Course  I have reviewed the triage vital signs and the nursing notes.  Pertinent labs & imaging results that were available during my care of the patient were reviewed by me and considered in my medical decision making (see chart for details).    CRITICAL CARE Performed by: Isla Pence   Total critical care time: 30 minutes  Critical care time was exclusive of separately billable procedures and treating other patients.  Critical care was necessary to treat or prevent imminent or life-threatening deterioration.  Critical care was time spent personally by me on the following activities: development of treatment plan with patient and/or surrogate as well as nursing, discussions with consultants, evaluation of patient's response to treatment, examination of patient, obtaining history from patient or surrogate, ordering and performing treatments and interventions, ordering and review of laboratory studies, ordering and review of radiographic studies, pulse oximetry and re-evaluation of patient's condition.   Pt was very sob upon arrival and was immediately placed on bipap.  The pt has improved with bipap.  He was d/w [facility] resident for admission.  Final Clinical Impressions(s) / ED Diagnoses   Final diagnoses:  Acute respiratory failure with hypoxia (HCC)  Acute on chronic congestive heart failure, unspecified heart failure type (HCC)  CKD (chronic kidney disease) stage 3, GFR 30-59 ml/min (HCC)  Chronic anemia    ED Discharge Orders    None       Isla Pence, MD 07/05/17 1147

## 2017-07-05 NOTE — ED Notes (Signed)
Attempted Report 

## 2017-07-06 ENCOUNTER — Inpatient Hospital Stay (HOSPITAL_COMMUNITY): Payer: Medicare Other

## 2017-07-06 DIAGNOSIS — I509 Heart failure, unspecified: Secondary | ICD-10-CM

## 2017-07-06 LAB — BASIC METABOLIC PANEL
ANION GAP: 9 (ref 5–15)
BUN: 87 mg/dL — AB (ref 6–20)
CO2: 19 mmol/L — AB (ref 22–32)
Calcium: 8.3 mg/dL — ABNORMAL LOW (ref 8.9–10.3)
Chloride: 116 mmol/L — ABNORMAL HIGH (ref 101–111)
Creatinine, Ser: 2.43 mg/dL — ABNORMAL HIGH (ref 0.61–1.24)
GFR calc Af Amer: 30 mL/min — ABNORMAL LOW (ref >60)
GFR calc non Af Amer: 26 mL/min — ABNORMAL LOW (ref >60)
GLUCOSE: 267 mg/dL — AB (ref 65–99)
POTASSIUM: 4.5 mmol/L (ref 3.5–5.1)
Sodium: 144 mmol/L (ref 135–145)

## 2017-07-06 LAB — BLOOD GAS, ARTERIAL
ACID-BASE DEFICIT: 5.2 mmol/L — AB (ref 0.0–2.0)
BICARBONATE: 19 mmol/L — AB (ref 20.0–28.0)
DELIVERY SYSTEMS: POSITIVE
Drawn by: 519031
EXPIRATORY PAP: 6
FIO2: 60
Inspiratory PAP: 10
LHR: 8 {breaths}/min
O2 Saturation: 95.6 %
PH ART: 7.384 (ref 7.350–7.450)
Patient temperature: 98.6
pCO2 arterial: 32.5 mmHg (ref 32.0–48.0)
pO2, Arterial: 78.5 mmHg — ABNORMAL LOW (ref 83.0–108.0)

## 2017-07-06 LAB — URINALYSIS, COMPLETE (UACMP) WITH MICROSCOPIC
Bilirubin Urine: NEGATIVE
Glucose, UA: NEGATIVE mg/dL
KETONES UR: NEGATIVE mg/dL
LEUKOCYTES UA: NEGATIVE
NITRITE: NEGATIVE
Protein, ur: 100 mg/dL — AB
Specific Gravity, Urine: 1.011 (ref 1.005–1.030)
pH: 5 (ref 5.0–8.0)

## 2017-07-06 LAB — CBC
HCT: 24.4 % — ABNORMAL LOW (ref 39.0–52.0)
Hemoglobin: 7.3 g/dL — ABNORMAL LOW (ref 13.0–17.0)
MCH: 26 pg (ref 26.0–34.0)
MCHC: 29.9 g/dL — AB (ref 30.0–36.0)
MCV: 86.8 fL (ref 78.0–100.0)
Platelets: 109 10*3/uL — ABNORMAL LOW (ref 150–400)
RBC: 2.81 MIL/uL — ABNORMAL LOW (ref 4.22–5.81)
RDW: 18.8 % — ABNORMAL HIGH (ref 11.5–15.5)
WBC: 12.1 10*3/uL — ABNORMAL HIGH (ref 4.0–10.5)

## 2017-07-06 LAB — LIPID PANEL
Cholesterol: 101 mg/dL (ref 0–200)
HDL: 51 mg/dL (ref >40)
LDL CALC: 37 mg/dL (ref 0–99)
TRIGLYCERIDES: 66 mg/dL (ref <150)
Total CHOL/HDL Ratio: 2 RATIO
VLDL: 13 mg/dL (ref 0–40)

## 2017-07-06 LAB — ECHOCARDIOGRAM COMPLETE
HEIGHTINCHES: 68 in
Weight: 3484.8 oz

## 2017-07-06 LAB — HIV ANTIBODY (ROUTINE TESTING W REFLEX): HIV SCREEN 4TH GENERATION: NONREACTIVE

## 2017-07-06 LAB — TSH: TSH: 2.202 u[IU]/mL (ref 0.350–4.500)

## 2017-07-06 LAB — APTT
APTT: 37 s — AB (ref 24–36)
aPTT: 65 seconds — ABNORMAL HIGH (ref 24–36)

## 2017-07-06 LAB — GLUCOSE, CAPILLARY
GLUCOSE-CAPILLARY: 259 mg/dL — AB (ref 65–99)
GLUCOSE-CAPILLARY: 235 mg/dL — AB (ref 65–99)
GLUCOSE-CAPILLARY: 248 mg/dL — AB (ref 65–99)
Glucose-Capillary: 278 mg/dL — ABNORMAL HIGH (ref 65–99)

## 2017-07-06 LAB — HEPARIN LEVEL (UNFRACTIONATED): HEPARIN UNFRACTIONATED: 1.16 [IU]/mL — AB (ref 0.30–0.70)

## 2017-07-06 MED ORDER — HYDRALAZINE HCL 20 MG/ML IJ SOLN
20.0000 mg | INTRAMUSCULAR | Status: DC | PRN
  Administered 2017-07-06 – 2017-07-21 (×9): 20 mg via INTRAVENOUS
  Filled 2017-07-06 (×13): qty 1

## 2017-07-06 MED ORDER — LORAZEPAM 2 MG/ML IJ SOLN
0.5000 mg | Freq: Once | INTRAMUSCULAR | Status: AC
  Administered 2017-07-06: 0.5 mg via INTRAVENOUS
  Filled 2017-07-06: qty 1

## 2017-07-06 MED ORDER — ORAL CARE MOUTH RINSE
15.0000 mL | Freq: Two times a day (BID) | OROMUCOSAL | Status: DC
  Administered 2017-07-07 – 2017-07-08 (×3): 15 mL via OROMUCOSAL

## 2017-07-06 MED ORDER — ORAL CARE MOUTH RINSE
15.0000 mL | Freq: Two times a day (BID) | OROMUCOSAL | Status: DC
  Administered 2017-07-08: 15 mL via OROMUCOSAL

## 2017-07-06 MED ORDER — HEPARIN (PORCINE) IN NACL 100-0.45 UNIT/ML-% IJ SOLN
1050.0000 [IU]/h | INTRAMUSCULAR | Status: DC
  Administered 2017-07-06: 1050 [IU]/h via INTRAVENOUS
  Administered 2017-07-07 – 2017-07-09 (×3): 1150 [IU]/h via INTRAVENOUS
  Administered 2017-07-10: 1050 [IU]/h via INTRAVENOUS
  Filled 2017-07-06 (×5): qty 250

## 2017-07-06 MED ORDER — CHLORHEXIDINE GLUCONATE 0.12 % MT SOLN
15.0000 mL | Freq: Two times a day (BID) | OROMUCOSAL | Status: DC
  Administered 2017-07-06 – 2017-07-11 (×11): 15 mL via OROMUCOSAL
  Filled 2017-07-06 (×6): qty 15

## 2017-07-06 NOTE — Progress Notes (Signed)
ANTICOAGULATION CONSULT NOTE   Pharmacy Consult for Heparin Indication: h/o chronic DVT  No Known Allergies  Patient Measurements: Height: '5\' 8"'  (172.7 cm) Weight: 217 lb 12.8 oz (98.8 kg) IBW/kg (Calculated) : 68.4 Heparin Dosing Weight: 89.5 kg  Vital Signs: Temp: 97.7 F (36.5 C) (06/03 2314) Temp Source: Oral (06/03 2314) BP: 175/64 (06/03 2332) Pulse Rate: 84 (06/03 2332)  Labs: Recent Labs    07/05/17 1036 07/06/17 0347 07/06/17 1028 07/06/17 1500 07/06/17 2306  HGB 7.8*  --  7.3*  --   --   HCT 26.4*  --  24.4*  --   --   PLT 113*  --  109*  --   --   APTT  --   --   --  37* 65*  LABPROT 16.8*  --   --   --   --   INR 1.37  --   --   --   --   HEPARINUNFRC  --   --   --  1.16*  --   CREATININE 2.39* 2.43*  --   --   --     Estimated Creatinine Clearance: 34.1 mL/min (A) (by C-G formula based on SCr of 2.43 mg/dL (H)).   Medical History: Past Medical History:  Diagnosis Date  . Cancer ([place])    skin cancer-  . CHF (congestive heart failure) ([place])   . Chronic kidney disease    not on dialysis  . Coronary artery disease   . Gangrene of toe ([place])    right 5th/notes 01/04/2013   . High cholesterol   . Hypertension   . Iron deficiency anemia 08/03/2015  . 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 PAD (peripheral artery disease) ([place])   . Shortness of breath dyspnea    with exertion  . Type II diabetes mellitus (HCC)    Type II    Medications:  Medications Prior to Admission  Medication Sig Dispense Refill Last Dose  . allopurinol (ZYLOPRIM) 100 MG tablet Take 100 mg by mouth daily.   07/04/2017 at Unknown time  . amLODipine (NORVASC) 10 MG tablet Take 1 tablet (10 mg total) by mouth daily. Needs office visit for further refills 30 tablet 0 07/04/2017 at Unknown time  . atorvastatin (LIPITOR) 40 MG tablet TAKE ONE (1) TABLET BY MOUTH EVERY DAY AT 6:00PM (Patient taking differently: Take 40 mg by mouth once a day at 6 PM) 90 tablet  3 07/04/2017 at Unknown time  . carvedilol (COREG) 25 MG tablet TAKE TWO (2) TABLETS BY MOUTH 2 TIMES DAILY WITH A MEAL (Patient taking differently: TAKE TWO TABLETS (78m) BY MOUTH 2 TIMES DAILY WITH A MEAL) 120 tablet 3 07/04/2017 at 1800  . cloNIDine (CATAPRES) 0.2 MG tablet TAKE TWO (2) TABLETS BY MOUTH 2 TIMES DAILY 120 tablet 3 07/04/2017 at 1800  . doxazosin (CARDURA) 8 MG tablet Take 1 tablet (8 mg total) by mouth every evening. (Patient taking differently: Take 8 mg by mouth at bedtime. )   07/04/2017 at Unknown time  . ELIQUIS 2.5 MG TABS tablet Take 2.5 mg by mouth 2 (two) times daily.   07/04/2017 at 0800  . FEROSUL 325 (65 Fe) MG tablet TAKE ONE (1) TABLET BY MOUTH TWO (2) TIMES DAILY WITH A MEAL (Patient taking differently: TAKE 325 MG BY MOUTH TWO (2) TIMES DAILY WITH A MEAL) 60 tablet 3 07/04/2017 at Unknown time  . hydrALAZINE (APRESOLINE) 100 MG tablet TAKE ONE TABLET BY MOUTH EVERY EIGHT  HOURS (Patient taking differently: No sig reported) 90 tablet 1 07/04/2017 at 1400  . insulin aspart (NOVOLOG FLEXPEN) 100 UNIT/ML FlexPen Inject 15 Units 2 (two) times daily before lunch and supper into the skin. 15 mL 5 07/04/2017 at 1800  . ipratropium-albuterol (DUONEB) 0.5-2.5 (3) MG/3ML SOLN Take 3 mLs by nebulization 4 (four) times daily.   07/04/2017 at 1700  . isosorbide mononitrate (IMDUR) 60 MG 24 hr tablet TAKE ONE (1) TABLET BY MOUTH EVERY DAY (Patient taking differently: TAKE ONE TABLET (67m) BY MOUTH EVERY DAY) 30 tablet 3 07/04/2017 at Unknown time  . levothyroxine (SYNTHROID, LEVOTHROID) 100 MCG tablet Take 100 mcg by mouth daily before breakfast.   07/04/2017 at Unknown time  . nitroGLYCERIN (NITROSTAT) 0.4 MG SL tablet Place 1 tablet (0.4 mg total) under the tongue every 5 (five) minutes as needed for chest pain. 30 tablet 12 unk at prn  . pantoprazole (PROTONIX) 40 MG tablet TAKE ONE (1) TABLET BY MOUTH EVERY DAY (Patient taking differently: TAKE ONE TABLET (42m BY MOUTH EVERY DAY) 90 tablet 0  07/04/2017 at Unknown time  . potassium chloride SA (K-DUR,KLOR-CON) 20 MEQ tablet Take 20 mEq by mouth 2 (two) times daily.    07/04/2017 at Unknown time  . predniSONE (DELTASONE) 20 MG tablet Take 40 mg by mouth daily with breakfast.   07/04/2017 at Unknown time  . torsemide (DEMADEX) 20 MG tablet Take 20 mg by mouth 2 (two) times daily.   07/04/2017 at Unknown time  . Blood Glucose Monitoring Suppl (ONETOUCH VERIO FLEX SYSTEM) w/Device KIT 1 kit by Does not apply route daily. 1 kit 3 Taking  . glucose blood (ONETOUCH VERIO) test strip Use as instructed to test three times daily. ICD-10 code: E11.22 100 each 12 Taking  . ONETOUCH DELICA LANCETS [id number] 1 each by Does not apply route 3 (three) times daily. ICD-10 code: E11.22 100 each 12 Taking  . oxyCODONE (OXY IR/ROXICODONE) 5 MG immediate release tablet Take 1 tablet (5 mg total) by mouth every 6 (six) hours as needed for moderate pain. (Patient not taking: Reported on 07/05/2017) 30 tablet 0 Not Taking at Unknown time  . traMADol (ULTRAM) 50 MG tablet Take 1 tablet (50 mg total) by mouth every 6 (six) hours as needed for moderate pain. (Patient not taking: Reported on 07/05/2017) 60 tablet 0 Not Taking at Unknown time    Assessment: [age].o male with h/o chronic DVT on apixaban PTA, last taken pta on 07/04/17 at 0800 per SNF medication records.  Pharmacy consulted to change to IV heparin. Hgb 7.3, pltc 109K, watch.  No bleeding reported and confirmed with nurse.  Initial aPTT 65 sec  Goal of Therapy:  aPTT 66-102 seconds Monitor platelets by anticoagulation protocol: Yes   Plan:  Increase heparin drip 1150 units/hr Monitor aPTT due to likely effect of apixaban on heparin level.  Daily  APTT and heparin level, and CBC  LoExcell SeltzerPharmD Clinical Pharmacist 07/06/2017,11:56 PM

## 2017-07-06 NOTE — Progress Notes (Signed)
Contacted MD- Pt not tolerating removal of bipap- requesting IV meds. New orders received will continue to monitor

## 2017-07-06 NOTE — Plan of Care (Signed)
  Problem: Clinical Measurements: Goal: Respiratory complications will improve Outcome: Not Progressing   

## 2017-07-06 NOTE — Progress Notes (Signed)
Inpatient Diabetes Program Recommendations  AACE/ADA: New Consensus Statement on Inpatient Glycemic Control (2015)  Target Ranges:  Prepandial:   less than 140 mg/dL      Peak postprandial:   less than 180 mg/dL (1-2 hours)      Critically ill patients:  140 - 180 mg/dL   Results for Leonard [name], Leonard [name] (MRN [id number]) as of 07/06/2017 11:08  Ref. Range 07/05/2017 18:20 07/05/2017 19:38 07/06/2017 07:49  Glucose-Capillary Latest Ref Range: 65 - 99 mg/dL 234 (H) Novolog 3 units given 237 (H) 259 (H) Novolog 5 units given   Review of Glycemic Control  Diabetes history: DM 2 Outpatient Diabetes medications: Novolog 15 units with lunch and supper Current orders for Inpatient glycemic control: Novolog Sensitive Correction 0-9 units tid  A1c 6.9% on 6/2 this admission  Inpatient Diabetes Program Recommendations:    Glucose consistently in the 200's. Consider increasing Novolog Correction scale to Moderate 0-15 units and add Novolog HS scale 0-5 units.  Thanks,  Tama Headings RN, MSN, BC-ADM, [facility] Inpatient Diabetes Coordinator Team Pager [phone] (8a-5p)

## 2017-07-06 NOTE — Progress Notes (Signed)
Patient was unable to tolerate being off Bipap. O2 sats decline to low 80s within minutes on RA. Patient unable to receive oral medications. Notified MD to see if they would switch IMDUR to IV nitro, stated will look at it. Will continue to monitor.

## 2017-07-06 NOTE — Progress Notes (Addendum)
ANTICOAGULATION CONSULT NOTE - Initial Consult  Pharmacy Consult for Heparin Indication: h/o chronic DVT  No Known Allergies  Patient Measurements: Height: '5\' 8"'  (172.7 cm) Weight: 217 lb 12.8 oz (98.8 kg) IBW/kg (Calculated) : 68.4 Heparin Dosing Weight: 89.5 kg  Vital Signs: Temp: 97.8 F (36.6 C) (06/03 0644) Temp Source: Axillary (06/03 0644) BP: 185/70 (06/03 1501) Pulse Rate: 74 (06/03 1330)  Labs: Recent Labs    07/05/17 1036 07/06/17 0347 07/06/17 1028  HGB 7.8*  --  7.3*  HCT 26.4*  --  24.4*  PLT 113*  --  109*  LABPROT 16.8*  --   --   INR 1.37  --   --   CREATININE 2.39* 2.43*  --     Estimated Creatinine Clearance: 34.1 mL/min (A) (by C-G formula based on SCr of 2.43 mg/dL (H)).   Medical History: Past Medical History:  Diagnosis Date  . Cancer ([place])    skin cancer-  . CHF (congestive heart failure) ([place])   . Chronic kidney disease    not on dialysis  . Coronary artery disease   . Gangrene of toe ([place])    right 5th/notes 01/04/2013   . High cholesterol   . Hypertension   . Iron deficiency anemia 08/03/2015  . 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 PAD (peripheral artery disease) ([place])   . Shortness of breath dyspnea    with exertion  . Type II diabetes mellitus (HCC)    Type II    Medications:  Medications Prior to Admission  Medication Sig Dispense Refill Last Dose  . allopurinol (ZYLOPRIM) 100 MG tablet Take 100 mg by mouth daily.   07/04/2017 at Unknown time  . amLODipine (NORVASC) 10 MG tablet Take 1 tablet (10 mg total) by mouth daily. Needs office visit for further refills 30 tablet 0 07/04/2017 at Unknown time  . atorvastatin (LIPITOR) 40 MG tablet TAKE ONE (1) TABLET BY MOUTH EVERY DAY AT 6:00PM (Patient taking differently: Take 40 mg by mouth once a day at 6 PM) 90 tablet 3 07/04/2017 at Unknown time  . carvedilol (COREG) 25 MG tablet TAKE TWO (2) TABLETS BY MOUTH 2 TIMES DAILY WITH A MEAL (Patient taking  differently: TAKE TWO TABLETS (2m) BY MOUTH 2 TIMES DAILY WITH A MEAL) 120 tablet 3 07/04/2017 at 1800  . cloNIDine (CATAPRES) 0.2 MG tablet TAKE TWO (2) TABLETS BY MOUTH 2 TIMES DAILY 120 tablet 3 07/04/2017 at 1800  . doxazosin (CARDURA) 8 MG tablet Take 1 tablet (8 mg total) by mouth every evening. (Patient taking differently: Take 8 mg by mouth at bedtime. )   07/04/2017 at Unknown time  . ELIQUIS 2.5 MG TABS tablet Take 2.5 mg by mouth 2 (two) times daily.   07/04/2017 at 0800  . FEROSUL 325 (65 Fe) MG tablet TAKE ONE (1) TABLET BY MOUTH TWO (2) TIMES DAILY WITH A MEAL (Patient taking differently: TAKE 325 MG BY MOUTH TWO (2) TIMES DAILY WITH A MEAL) 60 tablet 3 07/04/2017 at Unknown time  . hydrALAZINE (APRESOLINE) 100 MG tablet TAKE ONE TABLET BY MOUTH EVERY EIGHT HOURS (Patient taking differently: No sig reported) 90 tablet 1 07/04/2017 at 1400  . insulin aspart (NOVOLOG FLEXPEN) 100 UNIT/ML FlexPen Inject 15 Units 2 (two) times daily before lunch and supper into the skin. 15 mL 5 07/04/2017 at 1800  . ipratropium-albuterol (DUONEB) 0.5-2.5 (3) MG/3ML SOLN Take 3 mLs by nebulization 4 (four) times daily.   07/04/2017 at 1700  .  isosorbide mononitrate (IMDUR) 60 MG 24 hr tablet TAKE ONE (1) TABLET BY MOUTH EVERY DAY (Patient taking differently: TAKE ONE TABLET (22m) BY MOUTH EVERY DAY) 30 tablet 3 07/04/2017 at Unknown time  . levothyroxine (SYNTHROID, LEVOTHROID) 100 MCG tablet Take 100 mcg by mouth daily before breakfast.   07/04/2017 at Unknown time  . nitroGLYCERIN (NITROSTAT) 0.4 MG SL tablet Place 1 tablet (0.4 mg total) under the tongue every 5 (five) minutes as needed for chest pain. 30 tablet 12 unk at prn  . pantoprazole (PROTONIX) 40 MG tablet TAKE ONE (1) TABLET BY MOUTH EVERY DAY (Patient taking differently: TAKE ONE TABLET (457m BY MOUTH EVERY DAY) 90 tablet 0 07/04/2017 at Unknown time  . potassium chloride SA (K-DUR,KLOR-CON) 20 MEQ tablet Take 20 mEq by mouth 2 (two) times daily.    07/04/2017 at  Unknown time  . predniSONE (DELTASONE) 20 MG tablet Take 40 mg by mouth daily with breakfast.   07/04/2017 at Unknown time  . torsemide (DEMADEX) 20 MG tablet Take 20 mg by mouth 2 (two) times daily.   07/04/2017 at Unknown time  . Blood Glucose Monitoring Suppl (ONETOUCH VERIO FLEX SYSTEM) w/Device KIT 1 kit by Does not apply route daily. 1 kit 3 Taking  . glucose blood (ONETOUCH VERIO) test strip Use as instructed to test three times daily. ICD-10 code: E11.22 100 each 12 Taking  . ONETOUCH DELICA LANCETS 3346XISC 1 each by Does not apply route 3 (three) times daily. ICD-10 code: E11.22 100 each 12 Taking  . oxyCODONE (OXY IR/ROXICODONE) 5 MG immediate release tablet Take 1 tablet (5 mg total) by mouth every 6 (six) hours as needed for moderate pain. (Patient not taking: Reported on 07/05/2017) 30 tablet 0 Not Taking at Unknown time  . traMADol (ULTRAM) 50 MG tablet Take 1 tablet (50 mg total) by mouth every 6 (six) hours as needed for moderate pain. (Patient not taking: Reported on 07/05/2017) 60 tablet 0 Not Taking at Unknown time    Assessment: [age].o male with h/o chronic DVT on apixaban PTA, last taken pta on 07/04/17 at 0800 per SNF medication records.  Pharmacy consulted to change to IV heparin. Hgb 7.3, pltc 109K, watch.  No bleeding reported and confirmed with nurse.   Goal of Therapy:  aPTT 66-102 seconds Monitor platelets by anticoagulation protocol: Yes   Plan:  STAT baseline aPTT and heparin level Heparin drip 1050 units/hr Monitor aPTT due to likely effect of apixaban on heparin level.  6 hour aPTT and heparin level Daily  APTT and heparin level, and CBC  RuNicole CellaRPh Clinical Pharmacist 07/06/2017,3:17 PM

## 2017-07-06 NOTE — Progress Notes (Signed)
Family Medicine progress update  Paged about some increased agitation, and patient trying to take off mask more frequently. Will try ativan 0.5mg  and see how he does.  Guadalupe Dawn MD PGY-1 Family Medicine Resident

## 2017-07-06 NOTE — Progress Notes (Signed)
Family Medicine Teaching Service Daily Progress Note Intern Pager: [phone]  [name] Medical record number: [id number] Date of birth: 14-Jun-1950 Age: 67 y.o. Gender: male  Primary Care Provider: [name], MD Consultants: none Code Status: full  Pt Overview and Major Events to Date:  Admitted 6/2 Remains on BiPap 6/3  Assessment and Plan:  Leonard [name] is a 67 y.o. male presenting with SOB. PMH is significant for COPD, CKD stage 4, CAD s/p CABG, T2DM, HTN, HLD.  Acute hypoxic respiratory failure with shortness of breath. Patient requiring BiPAP at admission and continues to require it on 6/3 am.  Differential includes HFpEF exacerbation (most likely) with possible PNA or COPD exacerbation.  Treated for HFpEF exacerbation currently since this best fits clinical picture.  Weights measured in the hospital unreliable since ranged from 175 lb in ED to 217 on floor; will continue to trend as daily floor measurements may be more comparable.  Urine output vigorous with 3.3 L output over past 24 hours. - monitor on telemetry - strict I/Os, daily weights - continue IV lasix 80mg  BID d/t continue hypertension and volume overload - echo - coreg 25mg  BID, imdur 60mg  qd, hydralazine 100mg  q8 (not taking currently since NPO) - prn duonebs - ativan 0.5 mg PRN for discomfort while on bipap (can give if nurse or patient requests) - possibility of PNA; will obtain repeat CXR once patient has diuresed and monitor for fevers  T2DM. A1c 6.9 on 07/05/17. CBGs have been in mid-200s with patient NPO over last 24 hours; could be due to stress response or infection since A1c is not consistent with sugars this high. - change from sSSI to mSSI - evaluate need for Lantus on 6/4 am - monitor CBGs - obtain UA to rule out UTI as cause for elevated WBC and elevated CBGs  Hypertension. BP on admit 167/66, elevated to 195/70 on 6/3. On home medications and IV Lasix 80 mg BID. -  Continue home amlodipine, Coreg, clonidine, doxazosin, hydralazine, Imdur when able to take PO. - PRN hydralazine 20 mg Q4H for SBP >160, DBP >100 - IV diuresis as per above - monitor BPs  CKD IV. creatinine around baseline at 2.43 on 6/3. Follows with Dr. Marval Regal. Has been undergoing preparations to start dialysis but both patient and sister report that he has not needed to and continues to make urine. - Monitor kidney fuction  Hypothyroidism. TSH wnl at 2.202 on 6/3 - Continue home levothyroxine 196mcg qd  HLD. Last LDL 37 on 07/06/17; other elements of lipid panel wnl. - continue home atorvastatin 40 mg daily  H/o chronic DVT on Eliquis at home - continue eliquis when able to take NPO - heparin at treatment dose for chronic DVT, dosed by pharmacy  FEN/GI: NPO while on Bipap PPx: Heparin  Disposition: continue in stepdown  Subjective:  Patient denies pain but is uncomfortable wearing Bipap.  Would like Ativan to help ease his discomfort.  Objective: Temp:  [96.3 F (35.7 C)-98.2 F (36.8 C)] 97.8 F (36.6 C) (06/03 0644) Pulse Rate:  [61-84] 83 (06/03 0644) Resp:  [14-28] 28 (06/03 0644) BP: (143-195)/(61-99) 195/70 (06/03 0709) SpO2:  [75 %-99 %] 95 % (06/03 0644) FiO2 (%):  [50 %-60 %] 60 % (06/03 0644) Weight:  [175 lb (79.4 kg)-217 lb 12.8 oz (98.8 kg)] 217 lb 12.8 oz (98.8 kg) (06/03 2778) Physical Exam: General: lying in bed, bipap in place, appears uncomfortable Cardiovascular: RRR, no MRG Respiratory: coarse transmitted sounds  from bipap, lung sounds masked by transmitted sounds Abdomen: soft, nontender to palpation Extremities: normal ROM, L AKA  Laboratory: Recent Labs  Lab 07/05/17 1036  WBC 12.2*  HGB 7.8*  HCT 26.4*  PLT 113*   Recent Labs  Lab 07/05/17 1036 07/06/17 0347  NA 140 144  K 4.5 4.5  CL 112* 116*  CO2 19* 19*  BUN 85* 87*  CREATININE 2.39* 2.43*  CALCIUM 8.2* 8.3*  GLUCOSE 307* 267*    Imaging/Diagnostic Tests: Dg  Chest Port 1 View  Result Date: 07/05/2017 CLINICAL DATA:  Shortness of breath EXAM: PORTABLE CHEST 1 VIEW COMPARISON:  03/21/2016; 01/21/2016; 02/05/2015 FINDINGS: Grossly unchanged enlarged cardiac silhouette and mediastinal contours post median sternotomy and CABG. The pulmonary vasculature appears indistinct with cephalization of flow. Small to moderate-sized right-sided pleural effusion with associated right basilar consolidative opacities. Slight worsening of left mid and lower lung heterogeneous potential airspace opacities. No definite left-sided pleural effusion. No pneumothorax. No definite acute osseus abnormalities. Vascular calcifications overlie right axilla and lower neck bilaterally. IMPRESSION: Constellation of findings most worrisome for pulmonary edema with small to moderate-sized right-sided effusion and associated worsening bilateral mid and lower lung heterogeneous/consolidative opacities, atelectasis versus infiltrate. Electronically Signed   By: Sandi Mariscal M.D.   On: 07/05/2017 10:40     Kathrene Alu, MD 07/06/2017, 7:16 AM PGY-1, [facility] Intern pager: [phone], text pages welcome

## 2017-07-06 NOTE — Progress Notes (Signed)
  Echocardiogram 2D Echocardiogram has been performed.  Leonard [name] 07/06/2017, 2:49 PM

## 2017-07-06 NOTE — Progress Notes (Signed)
FPTS Interim Progress Note  S: Patient lying in bed comfortably with BiPAP in place.  No increased work of breathing.  No complaints except for dry mouth and thirst.  No chest pain.  O: BP (!) 180/78   Pulse 86   Temp 98.5 F (36.9 C) (Axillary)   Resp (!) 33   Ht 5\' 8"  (1.727 m)   Wt 217 lb 12.8 oz (98.8 kg)   SpO2 97%   BMI 33.12 kg/m    Gen: Lying in bed with BiPAP in place. CV: regular rate and rythm. S1 & S2 audible, no murmurs. Resp: BiPAP in place.  Bilateral air movement.  Coarse lung sounds. MSK: Awake, alert and oriented appropriately   A/P: Acute respiratory failure: Likely due to CHF exacerbation.  Tolerating BiPAP well.  Protecting his airway well.  ABG not impressive. -Continue BiPAP -Continue diuretics -Oral toilet  Mercy Riding, MD 07/06/2017, 9:33 PM PGY-3, [facility] Medicine Service pager [phone]

## 2017-07-07 ENCOUNTER — Encounter (HOSPITAL_COMMUNITY): Payer: Self-pay | Admitting: *Deleted

## 2017-07-07 ENCOUNTER — Inpatient Hospital Stay (HOSPITAL_COMMUNITY): Payer: Medicare Other

## 2017-07-07 DIAGNOSIS — J449 Chronic obstructive pulmonary disease, unspecified: Secondary | ICD-10-CM

## 2017-07-07 DIAGNOSIS — I10 Essential (primary) hypertension: Secondary | ICD-10-CM

## 2017-07-07 DIAGNOSIS — J9601 Acute respiratory failure with hypoxia: Secondary | ICD-10-CM

## 2017-07-07 DIAGNOSIS — I259 Chronic ischemic heart disease, unspecified: Secondary | ICD-10-CM

## 2017-07-07 DIAGNOSIS — I472 Ventricular tachycardia: Secondary | ICD-10-CM

## 2017-07-07 LAB — APTT: aPTT: 67 seconds — ABNORMAL HIGH (ref 24–36)

## 2017-07-07 LAB — BLOOD GAS, ARTERIAL
ACID-BASE DEFICIT: 8.4 mmol/L — AB (ref 0.0–2.0)
ACID-BASE DEFICIT: 7.9 mmol/L — AB (ref 0.0–2.0)
Acid-base deficit: 9.3 mmol/L — ABNORMAL HIGH (ref 0.0–2.0)
BICARBONATE: 16.5 mmol/L — AB (ref 20.0–28.0)
BICARBONATE: 16.7 mmol/L — AB (ref 20.0–28.0)
Bicarbonate: 16.3 mmol/L — ABNORMAL LOW (ref 20.0–28.0)
DELIVERY SYSTEMS: POSITIVE
DRAWN BY: 36277
DRAWN BY: 36277
Delivery systems: POSITIVE
Delivery systems: POSITIVE
Drawn by: 12971
Expiratory PAP: 8
Expiratory PAP: 8
Expiratory PAP: 8
FIO2: 50
FIO2: 100
FIO2: 75
INSPIRATORY PAP: 14
Inspiratory PAP: 12
Inspiratory PAP: 16
MODE: POSITIVE
Mode: POSITIVE
O2 SAT: 87.3 %
O2 SAT: 95.9 %
O2 Saturation: 99.2 %
PATIENT TEMPERATURE: 98.6
PCO2 ART: 31.4 mmHg — AB (ref 32.0–48.0)
PCO2 ART: 32 mmHg (ref 32.0–48.0)
PH ART: 7.336 — AB (ref 7.350–7.450)
PO2 ART: 57.1 mmHg — AB (ref 83.0–108.0)
PO2 ART: 82.9 mmHg — AB (ref 83.0–108.0)
Patient temperature: 97.5
Patient temperature: 97.5
pCO2 arterial: 35.5 mmHg (ref 32.0–48.0)
pH, Arterial: 7.279 — ABNORMAL LOW (ref 7.350–7.450)
pH, Arterial: 7.338 — ABNORMAL LOW (ref 7.350–7.450)
pO2, Arterial: 152 mmHg — ABNORMAL HIGH (ref 83.0–108.0)

## 2017-07-07 LAB — GLUCOSE, CAPILLARY
GLUCOSE-CAPILLARY: 308 mg/dL — AB (ref 65–99)
Glucose-Capillary: 191 mg/dL — ABNORMAL HIGH (ref 65–99)
Glucose-Capillary: 164 mg/dL — ABNORMAL HIGH (ref 65–99)

## 2017-07-07 LAB — URINALYSIS, ROUTINE W REFLEX MICROSCOPIC
Bilirubin Urine: NEGATIVE
Glucose, UA: 50 mg/dL — AB
Ketones, ur: 5 mg/dL — AB
Leukocytes, UA: NEGATIVE
NITRITE: NEGATIVE
PH: 5 (ref 5.0–8.0)
Protein, ur: 100 mg/dL — AB
Specific Gravity, Urine: 1.01 (ref 1.005–1.030)

## 2017-07-07 LAB — POCT I-STAT 3, ART BLOOD GAS (G3+)
ACID-BASE DEFICIT: 2 mmol/L (ref 0.0–2.0)
Acid-base deficit: 3 mmol/L — ABNORMAL HIGH (ref 0.0–2.0)
BICARBONATE: 22 mmol/L (ref 20.0–28.0)
Bicarbonate: 21.7 mmol/L (ref 20.0–28.0)
O2 SAT: 88 %
O2 Saturation: 90 %
PCO2 ART: 33.5 mmHg (ref 32.0–48.0)
PH ART: 7.411 (ref 7.350–7.450)
PO2 ART: 50 mmHg — AB (ref 83.0–108.0)
Patient temperature: 95.7
TCO2: 23 mmol/L (ref 22–32)
TCO2: 23 mmol/L (ref 22–32)
pCO2 arterial: 31.3 mmHg — ABNORMAL LOW (ref 32.0–48.0)
pH, Arterial: 7.453 — ABNORMAL HIGH (ref 7.350–7.450)
pO2, Arterial: 53 mmHg — ABNORMAL LOW (ref 83.0–108.0)

## 2017-07-07 LAB — BASIC METABOLIC PANEL
Anion gap: 15 (ref 5–15)
BUN: 91 mg/dL — AB (ref 6–20)
CHLORIDE: 115 mmol/L — AB (ref 101–111)
CO2: 16 mmol/L — AB (ref 22–32)
Calcium: 8.5 mg/dL — ABNORMAL LOW (ref 8.9–10.3)
Creatinine, Ser: 2.51 mg/dL — ABNORMAL HIGH (ref 0.61–1.24)
GFR calc non Af Amer: 25 mL/min — ABNORMAL LOW (ref >60)
GFR, EST AFRICAN AMERICAN: 29 mL/min — AB (ref >60)
Glucose, Bld: 342 mg/dL — ABNORMAL HIGH (ref 65–99)
POTASSIUM: 4.7 mmol/L (ref 3.5–5.1)
SODIUM: 146 mmol/L — AB (ref 135–145)

## 2017-07-07 LAB — MAGNESIUM: Magnesium: 2.4 mg/dL (ref 1.7–2.4)

## 2017-07-07 LAB — CBC
HEMATOCRIT: 23.7 % — AB (ref 39.0–52.0)
Hemoglobin: 7 g/dL — ABNORMAL LOW (ref 13.0–17.0)
MCH: 26 pg (ref 26.0–34.0)
MCHC: 29.5 g/dL — ABNORMAL LOW (ref 30.0–36.0)
MCV: 88.1 fL (ref 78.0–100.0)
Platelets: 107 10*3/uL — ABNORMAL LOW (ref 150–400)
RBC: 2.69 MIL/uL — AB (ref 4.22–5.81)
RDW: 19.4 % — ABNORMAL HIGH (ref 11.5–15.5)
WBC: 11.4 10*3/uL — AB (ref 4.0–10.5)

## 2017-07-07 LAB — HEPARIN LEVEL (UNFRACTIONATED): Heparin Unfractionated: 1.02 IU/mL — ABNORMAL HIGH (ref 0.30–0.70)

## 2017-07-07 LAB — MRSA PCR SCREENING: MRSA BY PCR: NEGATIVE

## 2017-07-07 LAB — TROPONIN I: TROPONIN I: 0.04 ng/mL — AB (ref <0.03)

## 2017-07-07 LAB — BRAIN NATRIURETIC PEPTIDE: B Natriuretic Peptide: 464.2 pg/mL — ABNORMAL HIGH (ref 0.0–100.0)

## 2017-07-07 LAB — POTASSIUM: Potassium: 4.2 mmol/L (ref 3.5–5.1)

## 2017-07-07 MED ORDER — LABETALOL HCL 5 MG/ML IV SOLN
20.0000 mg | INTRAVENOUS | Status: DC | PRN
  Administered 2017-07-07 (×4): 20 mg via INTRAVENOUS
  Administered 2017-07-13: 10 mg via INTRAVENOUS
  Administered 2017-07-13: 20 mg via INTRAVENOUS
  Administered 2017-07-13: 10 mg via INTRAVENOUS
  Administered 2017-07-14 – 2017-07-20 (×10): 20 mg via INTRAVENOUS
  Filled 2017-07-07 (×16): qty 4

## 2017-07-07 MED ORDER — SODIUM BICARBONATE 8.4 % IV SOLN
INTRAVENOUS | Status: AC
  Filled 2017-07-07: qty 50

## 2017-07-07 MED ORDER — SODIUM BICARBONATE 8.4 % IV SOLN
50.0000 meq | Freq: Once | INTRAVENOUS | Status: AC
  Administered 2017-07-07: 50 meq via INTRAVENOUS

## 2017-07-07 MED ORDER — BUMETANIDE 0.25 MG/ML IJ SOLN
2.0000 mg | Freq: Once | INTRAMUSCULAR | Status: AC
  Administered 2017-07-07: 2 mg via INTRAVENOUS
  Filled 2017-07-07 (×3): qty 8

## 2017-07-07 MED ORDER — MORPHINE SULFATE (PF) 2 MG/ML IV SOLN
1.0000 mg | INTRAVENOUS | Status: DC | PRN
  Administered 2017-07-07: 1 mg via INTRAVENOUS
  Filled 2017-07-07: qty 1

## 2017-07-07 MED ORDER — MORPHINE SULFATE (PF) 4 MG/ML IV SOLN
4.0000 mg | INTRAVENOUS | Status: DC | PRN
Start: 2017-07-07 — End: 2017-07-13
  Administered 2017-07-07 (×5): 4 mg via INTRAVENOUS
  Filled 2017-07-07 (×5): qty 1

## 2017-07-07 MED ORDER — INSULIN GLARGINE 100 UNIT/ML ~~LOC~~ SOLN
16.0000 [IU] | Freq: Every day | SUBCUTANEOUS | Status: DC
  Administered 2017-07-07 – 2017-07-12 (×6): 16 [IU] via SUBCUTANEOUS
  Filled 2017-07-07 (×6): qty 0.16

## 2017-07-07 MED ORDER — NITROGLYCERIN 0.4 MG SL SUBL
0.4000 mg | SUBLINGUAL_TABLET | SUBLINGUAL | Status: DC | PRN
  Administered 2017-07-07: 0.4 mg via SUBLINGUAL
  Filled 2017-07-07: qty 1

## 2017-07-07 MED ORDER — FUROSEMIDE 10 MG/ML IJ SOLN
100.0000 mg | Freq: Two times a day (BID) | INTRAVENOUS | Status: DC
  Administered 2017-07-07 – 2017-07-11 (×9): 100 mg via INTRAVENOUS
  Filled 2017-07-07 (×10): qty 10

## 2017-07-07 MED ORDER — IPRATROPIUM-ALBUTEROL 0.5-2.5 (3) MG/3ML IN SOLN
3.0000 mL | Freq: Four times a day (QID) | RESPIRATORY_TRACT | Status: DC
  Administered 2017-07-07 – 2017-07-09 (×9): 3 mL via RESPIRATORY_TRACT
  Filled 2017-07-07 (×9): qty 3

## 2017-07-07 MED ORDER — FAMOTIDINE IN NACL 20-0.9 MG/50ML-% IV SOLN
20.0000 mg | INTRAVENOUS | Status: DC
  Administered 2017-07-07 – 2017-07-16 (×10): 20 mg via INTRAVENOUS
  Filled 2017-07-07 (×10): qty 50

## 2017-07-07 MED ORDER — FUROSEMIDE 10 MG/ML IJ SOLN
40.0000 mg | Freq: Once | INTRAMUSCULAR | Status: AC
  Administered 2017-07-07: 40 mg via INTRAVENOUS
  Filled 2017-07-07: qty 4

## 2017-07-07 NOTE — Progress Notes (Signed)
[place] for Heparin Indication: h/o chronic DVT  No Known Allergies  Patient Measurements: Height: _0  (172.7 cm) Weight: 208 lb 8.9 oz (94.6 kg) IBW/kg (Calculated) : 68.4 Heparin Dosing Weight: 89.5 kg  Vital Signs: Temp: 97.5 F (36.4 C) (06/04 0322) Temp Source: Axillary (06/04 0322) BP: 176/68 (06/04 0645) Pulse Rate: 76 (06/04 0645)  Labs: Recent Labs    07/05/17 1036 07/06/17 0347 07/06/17 1028 07/06/17 1500 07/06/17 2306 07/07/17 0432  HGB 7.8*  --  7.3*  --   --  7.0*  HCT 26.4*  --  24.4*  --   --  23.7*  PLT 113*  --  109*  --   --  107*  APTT  --   --   --  37* 65* 67*  LABPROT 16.8*  --   --   --   --   --   INR 1.37  --   --   --   --   --   HEPARINUNFRC  --   --   --  1.16*  --  1.02*  CREATININE 2.39* 2.43*  --   --   --  2.51*    Estimated Creatinine Clearance: 32.3 mL/min (A) (by C-G formula based on SCr of 2.51 mg/dL (H)).   Medical History: Past Medical History:  Diagnosis Date  . Cancer ([place])    skin cancer-  . CHF (congestive heart failure) ([place])   . Chronic kidney disease    not on dialysis  . Coronary artery disease   . Gangrene of toe ([place])    right 5th/notes 01/04/2013   . High cholesterol   . Hypertension   . Iron deficiency anemia 08/03/2015  . 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 PAD (peripheral artery disease) ([place])   . Shortness of breath dyspnea    with exertion  . Type II diabetes mellitus (H[facility])    Type II    Medications:  Medications Prior to Admission  Medication Sig Dispense Refill Last Dose  . allopurinol (ZYLOPRIM) 100 MG tablet Take 100 mg by mouth daily.   07/04/2017 at Unknown time  . amLODipine (NORVASC) 10 MG tablet Take 1 tablet (10 mg total) by mouth daily. Needs office visit for further refills 30 tablet 0 07/04/2017 at Unknown time  . atorvastatin (LIPITOR) 40 MG tablet TAKE ONE (1) TABLET BY MOUTH EVERY DAY AT 6:00PM (Patient taking  differently: Take 40 mg by mouth once a day at 6 PM) 90 tablet 3 07/04/2017 at Unknown time  . carvedilol (COREG) 25 MG tablet TAKE TWO (2) TABLETS BY MOUTH 2 TIMES DAILY WITH A MEAL (Patient taking differently: TAKE TWO TABLETS (31m) BY MOUTH 2 TIMES DAILY WITH A MEAL) 120 tablet 3 07/04/2017 at 1800  . cloNIDine (CATAPRES) 0.2 MG tablet TAKE TWO (2) TABLETS BY MOUTH 2 TIMES DAILY 120 tablet 3 07/04/2017 at 1800  . doxazosin (CARDURA) 8 MG tablet Take 1 tablet (8 mg total) by mouth every evening. (Patient taking differently: Take 8 mg by mouth at bedtime. )   07/04/2017 at Unknown time  . ELIQUIS 2.5 MG TABS tablet Take 2.5 mg by mouth 2 (two) times daily.   07/04/2017 at 0800  . FEROSUL 325 (65 Fe) MG tablet TAKE ONE (1) TABLET BY MOUTH TWO (2) TIMES DAILY WITH A MEAL (Patient taking differently: TAKE 325 MG BY MOUTH TWO (2) TIMES DAILY WITH A MEAL) 60 tablet 3 07/04/2017 at Unknown time  .  hydrALAZINE (APRESOLINE) 100 MG tablet TAKE ONE TABLET BY MOUTH EVERY EIGHT HOURS (Patient taking differently: No sig reported) 90 tablet 1 07/04/2017 at 1400  . insulin aspart (NOVOLOG FLEXPEN) 100 UNIT/ML FlexPen Inject 15 Units 2 (two) times daily before lunch and supper into the skin. 15 mL 5 07/04/2017 at 1800  . ipratropium-albuterol (DUONEB) 0.5-2.5 (3) MG/3ML SOLN Take 3 mLs by nebulization 4 (four) times daily.   07/04/2017 at 1700  . isosorbide mononitrate (IMDUR) 60 MG 24 hr tablet TAKE ONE (1) TABLET BY MOUTH EVERY DAY (Patient taking differently: TAKE ONE TABLET (56m) BY MOUTH EVERY DAY) 30 tablet 3 07/04/2017 at Unknown time  . levothyroxine (SYNTHROID, LEVOTHROID) 100 MCG tablet Take 100 mcg by mouth daily before breakfast.   07/04/2017 at Unknown time  . nitroGLYCERIN (NITROSTAT) 0.4 MG SL tablet Place 1 tablet (0.4 mg total) under the tongue every 5 (five) minutes as needed for chest pain. 30 tablet 12 unk at prn  . pantoprazole (PROTONIX) 40 MG tablet TAKE ONE (1) TABLET BY MOUTH EVERY DAY (Patient taking differently:  TAKE ONE TABLET (452m BY MOUTH EVERY DAY) 90 tablet 0 07/04/2017 at Unknown time  . potassium chloride SA (K-DUR,KLOR-CON) 20 MEQ tablet Take 20 mEq by mouth 2 (two) times daily.    07/04/2017 at Unknown time  . predniSONE (DELTASONE) 20 MG tablet Take 40 mg by mouth daily with breakfast.   07/04/2017 at Unknown time  . torsemide (DEMADEX) 20 MG tablet Take 20 mg by mouth 2 (two) times daily.   07/04/2017 at Unknown time  . Blood Glucose Monitoring Suppl (ONETOUCH VERIO FLEX SYSTEM) w/Device KIT 1 kit by Does not apply route daily. 1 kit 3 Taking  . glucose blood (ONETOUCH VERIO) test strip Use as instructed to test three times daily. ICD-10 code: E11.22 100 each 12 Taking  . ONETOUCH DELICA LANCETS 3317CISC 1 each by Does not apply route 3 (three) times daily. ICD-10 code: E11.22 100 each 12 Taking  . oxyCODONE (OXY IR/ROXICODONE) 5 MG immediate release tablet Take 1 tablet (5 mg total) by mouth every 6 (six) hours as needed for moderate pain. (Patient not taking: Reported on 07/05/2017) 30 tablet 0 Not Taking at Unknown time  . traMADol (ULTRAM) 50 MG tablet Take 1 tablet (50 mg total) by mouth every 6 (six) hours as needed for moderate pain. (Patient not taking: Reported on 07/05/2017) 60 tablet 0 Not Taking at Unknown time    Assessment: [age].o male with h/o chronic DVT on apixaban PTA, last taken pta on 07/04/17 at 0800 per SNF medication records.  Pharmacy consulted to change to IV heparin. Hgb 7.3, pltc 109K, watch.  No bleeding reported and confirmed with nurse.  Am aPTT 67 sec  Goal of Therapy:  aPTT 66-102 seconds Monitor platelets by anticoagulation protocol: Yes   Plan:  Continue heparin drip 1150 units/hr Monitor aPTT due to likely effect of apixaban on heparin level.  Daily  APTT and heparin level, and CBC  CaAlanda SlimPharmD, FCLaser And Surgical Eye Center LLClinical Pharmacist 07/07/2017,7:28 AM

## 2017-07-07 NOTE — Plan of Care (Signed)
  Problem: Education: Goal: Knowledge of General Education information will improve Outcome: Progressing   Problem: Clinical Measurements: Goal: Ability to maintain clinical measurements within normal limits will improve Outcome: Progressing   

## 2017-07-07 NOTE — Progress Notes (Signed)
[facility]  [name] MRN: [id number] DOB: 1950-10-20    ADMISSION DATE:  07/05/2017 CONSULTATION DATE:  07/07/2017  REFERRING MD:  Guadalupe Dawn, MD  REASON FOR CONSULTATION:  Respiratory distress   HISTORY OF PRESENT ILLNESS  This 67 y.o. African American male is seen in consultation at the request of Dr. Guadalupe Dawn for recommendations on further evaluation and management of respiratory distress. The patient was admitted on 07/05/2017 with a clinical impression of CHF exacerbation. He has been BiPAP dependent for about 36 hours or so. Rapid response team was called earlier this evening for increased work of breathing with use of accessory muscles, tachypnea (40s) and increasing FiO2 requirements. The patient was treated with adjustment of BiPAP settings and an additional furosemide dose (40 mg). At the time of clinical interview, the patient remains on BiPAP 12/8, FiO2 50%. RR is 28-30.  SPO2 90-91%.  He is awake, alert and follows commands. He is desperately asking for water; however, the patient precipitously desaturates with removal of the BiPAP mask.  Aside from being thirsty, the patient is tolerating BiPAP therapy quite well.  The service was consulted for consideration of endotracheal intubation and/or transfer to ICU.     VITAL SIGNS: BP (!) 179/68   Pulse 78   Temp 98 F (36.7 C) (Axillary)   Resp (!) 21   Ht 5\' 8"  (1.727 m)   Wt 94.6 kg (208 lb 8.9 oz)   SpO2 100%   BMI 31.71 kg/m   VENTILATOR SETTINGS: FiO2 (%):  [50 %-80 %] 80 %  INTAKE / OUTPUT:  Intake/Output Summary (Last 24 hours) at 07/07/2017 0809 Last data filed at 07/07/2017 0800 Gross per 24 hour  Intake 222.15 ml  Output 3175 ml  Net -2952.85 ml   PHYSICAL EXAMINATION: General: Ill-appearing male currently on full noninvasive mechanical ventilatory support but awake alert HEENT: Full facemask in place Neuro: Follows commands able to verbalize CV: Currently in  sinus rhythm PULM: Coarse rhonchi upper lobes, crackles lower bases IO:NGEX, non-tender, bsx4 active  Extremities: warm/dry, 1+ right lower extremity edema, left AKA with well-healed scar Skin: no rashes or lesions   LABS:  ABG Recent Labs  Lab 07/06/17 1625 07/07/17 0400 07/07/17 0520  PHART 7.384 7.336* 7.279*  PCO2ART 32.5 31.4* 35.5  PO2ART 78.5* 57.1* 152*    Cardiac Enzymes Lab Results  Component Value Date   TROPIPOC 0.02 07/05/2017   Lab Results  Component Value Date   BNP 464.2 (H) 07/07/2017   BNP 595.9 (H) 07/05/2017   BNP 202.1 (H) 52/84/1324    BASIC METABOLIC PROFILE Recent Labs  Lab 07/05/17 1036 07/06/17 0347 07/07/17 0432  NA 140 144 146*  K 4.5 4.5 4.7  CL 112* 116* 115*  CO2 19* 19* 16*  BUN 85* 87* 91*  CREATININE 2.39* 2.43* 2.51*  GLUCOSE 307* 267* 342*  CALCIUM 8.2* 8.3* 8.5*    Glucose Recent Labs  Lab 07/05/17 1820 07/05/17 1938 07/06/17 0749 07/06/17 1119 07/06/17 1648 07/06/17 1956  GLUCAP 234* 237* 259* 235* 278* 248*    CBC Recent Labs  Lab 07/05/17 1036 07/06/17 1028 07/07/17 0432  WBC 12.2* 12.1* 11.4*  HGB 7.8* 7.3* 7.0*  HCT 26.4* 24.4* 23.7*  PLT 113* 109* 107*    COAGULATION STUDIES Recent Labs  Lab 07/05/17 1036 07/06/17 1500 07/06/17 2306 07/07/17 0432  APTT  --  37* 65* 67*  INR 1.37  --   --   --  CULTURES: Results for orders placed or performed during the hospital encounter of 07/05/17  MRSA PCR Screening     Status: None   Collection Time: 07/07/17  5:45 AM  Result Value Ref Range Status   MRSA by PCR NEGATIVE NEGATIVE Final    Comment:        The GeneXpert MRSA Assay (FDA approved for NASAL specimens only), is one component of a comprehensive MRSA colonization surveillance program. It is not intended to diagnose MRSA infection nor to guide or monitor treatment for MRSA infections. Performed at [facility], [place] [address] [place], [place] [address]    *Note: Due to  a large number of results and/or encounters for the requested time period, some results have not been displayed. A complete set of results can be found in Results Review.    IMAGING: Dg Chest Port 1 View  Result Date: 07/07/2017 CLINICAL DATA:  Increasing shortness of breath, respiratory distress. EXAM: PORTABLE CHEST 1 VIEW COMPARISON:  Chest radiograph July 05, 2017 FINDINGS: Cardiac silhouette is mildly enlarged. Calcified aortic knob. Status post median sternotomy. Worsening interstitial and alveolar airspace opacities with small to moderate right pleural effusion, left costophrenic angle out of field of view. No pneumothorax. Subclavian vascular calcifications. Osseous structures are unchanged. IMPRESSION: Stable cardiomegaly. Worsening interstitial and alveolar airspace opacities concerning for pulmonary edema and/or ARDS. Small to moderate right pleural effusion. Aortic Atherosclerosis (ICD10-I70.0). Electronically Signed   By: Elon Alas M.D.   On: 07/07/2017 03:51    SIGNIFICANT EVENTS: 6/2: admitted with increased dyspnea and started on diuresis and BiPAP support for CHF exacerbation. 6/4: rapid response called for increased work of breathing. Remains BiPAP dependent. Transfer to ICU for closer monitoring. May require intubation. 07/07/2017 0800 hrs. tolerate noninvasive mechanical ventilatory support FiO2 is now down to 80% from 100 his pulmonary status remains tenuous.   ASSESSMENT / PLAN: Principal Problem:   Acute respiratory failure with hypoxia (HCC) Active Problems:   Essential hypertension   COPD GOLD III    CKD (chronic kidney disease) stage 3, GFR 30-59 ml/min (HCC)   Chronic anemia   Acute exacerbation of CHF (congestive heart failure) (HCC)   PULMONARY  ACUTE HYPOXEMIC RESPIRATORY FAILURE  ABNORMAL CHEST X-RAY/PULMONARY EDEMA  R PLEURAL EFFUSION  GOLD STAGE III COPD secondary to tobacco abuse history -07/07/2017 transferred to ICU -Continue  BiPAP -Aggressive diuresis -Monitor renal function -May need intubation.  CARDIOVASCULAR  CHF EXACERBATION  VOLUME OVERLOAD  Intake/Output Summary (Last 24 hours) at 07/07/2017 0805 Last data filed at 07/07/2017 0700 Gross per 24 hour  Intake 160.65 ml  Output 2750 ml  Net -2589.35 ml    Filed Weights   07/05/17 1028 07/06/17 0644 07/07/17 0322  Weight: 79.4 kg (175 lb) 98.8 kg (217 lb 12.8 oz) 94.6 kg (208 lb 8.9 oz)    -Attempt diuresis -Strict I&O -Daily weight -2D echo demonstrates grade 2 diastolic dysfunction  RENAL Lab Results  Component Value Date   CREATININE 2.51 (H) 07/07/2017   CREATININE 2.43 (H) 07/06/2017   CREATININE 2.39 (H) 07/05/2017   CREATININE 3.64 (H) 01/09/2016   CREATININE 2.33 (H) 09/25/2015   CREATININE 2.64 (H) 08/20/2015   CREATININE 2.7 (H) 08/16/2015   CREATININE 3.2 (HH) 07/05/2015   CREATININE 2.7 (H) 01/15/2015   Recent Labs  Lab 07/05/17 1036 07/06/17 0347 07/07/17 0432  K 4.5 4.5 4.7     CHRONIC KIDNEY DISEASE, STAGE III  PARTIALLY COMPENSATED NON-ANION GAP METABOLIC ACIDOSIS -One half of bicarb given  on 07/07/2017 -Continue to follow renal function -Nephrotoxins  HEMATOLOGIC Recent Labs    07/06/17 1028 07/07/17 0432  HGB 7.3* 7.0*     CHRONIC NORMOCYTIC ANEMIA/ANEMIA OF CHRONIC DISEASE  MONOCLONAL GAMMOPATHY OF UNKNOWN SIGNIFICANCE -On heparin drip history of Eliquis use for history of DVT. -Transfusion per protocol -No overt bleeding but hemoglobin noted to be 7  INFECTIOUS  LEUKOCYTOSIS, unknown clinical significance -Panculture -Not currently on antibiotics  ENDOCRINE CBG (last 3)  Recent Labs    07/06/17 1119 07/06/17 1648 07/06/17 1956  GLUCAP 235* 278* 248*     TYPE 2 DIABETES MELLITUS with NEPHROPATHY, NEUROPATHY, VASCULAR DISEASE -ssi may need increase, not currently on steroids  DVT PROPHYLAXIS: Currently on heparin drip GI PROPHYLAXIS: Pepcid IV  NUTRITION: Currently n.p.o.  while in acute noninvasive mechanical ventilatory support, if intubated will require tube feeding   FAMILY  - Updates: no family at bedside  - Inter-disciplinary family meet or Palliative Care meeting due by: 07/13/2017  App cct 35 min  Richardson Landry Minor ACNP Maryanna Shape PCCM Pager [phone] till 1 pm If no answer page [phone] 07/07/2017, 7:58 AM

## 2017-07-07 NOTE — Progress Notes (Signed)
This note also relates to the following rows which could not be included: Pulse Rate - Cannot attach notes to unvalidated device data SpO2 - Cannot attach notes to unvalidated device data  RT entered to make bipap check and pt was worsening.  RT called Rapid to assess pt.  Rapid in room with RT now.  Changes based on his status at this moment.  RT will continue to monitor.

## 2017-07-07 NOTE — Consult Note (Addendum)
Initial Pulmonary/Critical Care Consultation  [name] MRN: [id number] DOB: August 31, 1950    ADMISSION DATE:  07/05/2017 CONSULTATION DATE:  07/07/2017  REFERRING MD:  Guadalupe Dawn, MD  REASON FOR CONSULTATION:  Respiratory distress   HISTORY OF PRESENT ILLNESS  This 67 y.o. African American male is seen in consultation at the request of Dr. Guadalupe Dawn for recommendations on further evaluation and management of respiratory distress. The patient was admitted on 07/05/2017 with a clinical impression of CHF exacerbation. He has been BiPAP dependent for about 36 hours or so. Rapid response team was called earlier this evening for increased work of breathing with use of accessory muscles, tachypnea (40s) and increasing FiO2 requirements. The patient was treated with adjustment of BiPAP settings and an additional furosemide dose (40 mg). At the time of clinical interview, the patient remains on BiPAP 12/8, FiO2 50%. RR is 28-30.  SPO2 90-91%.  He is awake, alert and follows commands. He is desperately asking for water; however, the patient precipitously desaturates with removal of the BiPAP mask.  Aside from being thirsty, the patient is tolerating BiPAP therapy quite well.  The service was consulted for consideration of endotracheal intubation and/or transfer to ICU.   REVIEW OF SYSTEMS Constitutional: No weight loss. No night sweats. No fever. No chills. No fatigue. HEENT: No headaches, dysphagia, sore throat, otalgia, nasal congestion, PND CV:  Orthopnea. R LE edema. No chest pain, PND, palpitations GI:  No abdominal pain, nausea, vomiting, diarrhea, change in bowel pattern, anorexia Resp: BiPAP dependent. Dyspnea at rest. No cough, mucus, hemoptysis, wheezing  GU: No dysuria, change in color of urine, no urgency or frequency.  No flank pain. MS:  Vasculopath. L AKA. No joint pain or swelling. No myalgias,  No decreased range of motion.  Psych:  No change in mood or affect.  No memory loss. Skin: no rash or lesions.   PAST MEDICAL/SURGICAL/SOCIAL/FAMILY HISTORIES   Past Medical History:  Diagnosis Date  . Cancer ([place])    skin cancer-  . CHF (congestive heart failure) ([place])   . Chronic kidney disease    not on dialysis  . Coronary artery disease   . Gangrene of toe ([place])    right 5th/notes 01/04/2013   . High cholesterol   . Hypertension   . Iron deficiency anemia 08/03/2015  . 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 PAD (peripheral artery disease) ([place])   . Shortness of breath dyspnea    with exertion  . Type II diabetes mellitus ([place])    Type II    Past Surgical History:  Procedure Laterality Date  . ABDOMINAL AORTOGRAM W/LOWER EXTREMITY N/A 03/12/2017   Procedure: ABDOMINAL AORTOGRAM W/LOWER EXTREMITY;  Surgeon: Conrad Mercersburg, MD;  Location: [facility];  Service: Cardiovascular;  Laterality: N/A;  . AMPUTATION Right 01/28/2013   Procedure: RAY AMPUTATION RIGHT  4th & 5th TOE;  Surgeon: Rosetta Posner, MD;  Location: [facility];  Service: Vascular;  Laterality: Right;  . AMPUTATION Left 03/15/2017   Procedure: AMPUTATION ABOVE KNEE;  Surgeon: Waynetta Sandy, MD;  Location: [facility];  Service: Vascular;  Laterality: Left;  . ANGIOPLASTY / STENTING FEMORAL Right 01/13/2013   superficial femoral artery x 2 (3 mm x 60 mm, 4 mm x 60 mm)  . AV FISTULA PLACEMENT Left 02/11/2016   Procedure: LEFT UPPER ARM BRACHIAL CEPHALIC ARTERIOVENOUS (AV) FISTULA CREATION;  Surgeon: Conrad [place]awk, MD;  Location: [facility];  Service: Vascular;  Laterality: Left;  . COLONOSCOPY W/ POLYPECTOMY    . CORONARY ARTERY BYPASS GRAFT N/A 04/07/2013   Procedure: CORONARY ARTERY BYPASS GRAFTING (CABG);  Surgeon: Ivin Poot, MD;  Location: [name];  Service: Open Heart Surgery;  Laterality: N/A;  . INTRAOPERATIVE TRANSESOPHAGEAL ECHOCARDIOGRAM N/A 04/07/2013   Procedure: INTRAOPERATIVE TRANSESOPHAGEAL ECHOCARDIOGRAM;  Surgeon: Ivin Poot, MD;   Location: [facility];  Service: Open Heart Surgery;  Laterality: N/A;  . LEFT HEART CATHETERIZATION WITH CORONARY ANGIOGRAM N/A 04/05/2013   Procedure: LEFT HEART CATHETERIZATION WITH CORONARY ANGIOGRAM;  Surgeon: Peter M Martinique, MD;  Location: [facility] CATH LAB;  Service: Cardiovascular;  Laterality: N/A;  . LOWER EXTREMITY ANGIOGRAM Bilateral 01/06/2013   Procedure: LOWER EXTREMITY ANGIOGRAM;  Surgeon: Conrad Comptche, MD;  Location: [facility] CATH LAB;  Service: Cardiovascular;  Laterality: Bilateral;  . LOWER EXTREMITY ANGIOGRAM Right 01/13/2013   Procedure: LOWER EXTREMITY ANGIOGRAM;  Surgeon: Conrad Markham, MD;  Location: [facility] CATH LAB;  Service: Cardiovascular;  Laterality: Right;  . SKIN CANCER EXCISION  2017  . THROAT SURGERY  1983   polyps  . TONSILLECTOMY AND ADENOIDECTOMY  ~ 1965    Social History   Tobacco Use  . Smoking status: Former Smoker    Packs/day: 1.00    Years: 46.00    Pack years: 46.00    Types: Cigarettes    Start date: 02/04/1967    Last attempt to quit: 02/03/2013    Years since quitting: 4.4  . Smokeless tobacco: Never Used  . Tobacco comment: Doing well with quitting.  Substance Use Topics  . Alcohol use: No    Alcohol/week: 0.0 oz    Family History  Problem Relation Age of Onset  . Cancer Mother        lung cancer  . Hypertension Mother   . Cancer Sister        patient thinks it was uterine cancer  . Hypertension Sister   . Heart attack Sister   . Stroke Neg Hx     No Known Allergies   Prior to Admission medications   Medication Sig Start Date End Date Taking? Authorizing Provider  allopurinol (ZYLOPRIM) 100 MG tablet Take 100 mg by mouth daily. 05/04/17  Yes [provider]  amLODipine (NORVASC) 10 MG tablet Take 1 tablet (10 mg total) by mouth daily. Needs office visit for further refills 04/08/17  Yes Bensimhon, Shaune Pascal, MD  atorvastatin (LIPITOR) 40 MG tablet TAKE ONE (1) TABLET BY MOUTH EVERY DAY AT 6:00PM Patient taking differently: Take 40 mg by mouth  once a day at 6 PM 11/17/16  Yes Larey Dresser, MD  carvedilol (COREG) 25 MG tablet TAKE TWO (2) TABLETS BY MOUTH 2 TIMES DAILY WITH A MEAL Patient taking differently: TAKE TWO TABLETS (100m) BY MOUTH 2 TIMES DAILY WITH A MEAL 01/14/17  Yes Bensimhon, DShaune Pascal MD  cloNIDine (CATAPRES) 0.2 MG tablet TAKE TWO (2) TABLETS BY MOUTH 2 TIMES DAILY 11/11/16  Yes Bensimhon, DShaune Pascal MD  doxazosin (CARDURA) 8 MG tablet Take 1 tablet (8 mg total) by mouth every evening. Patient taking differently: Take 8 mg by mouth at bedtime.  12/01/16  Yes Hensel, WJamal Collin MD  ELIQUIS 2.5 MG TABS tablet Take 2.5 mg by mouth 2 (two) times daily. 05/04/17  Yes [provider]  FEROSUL 325 (65 Fe) MG tablet TAKE ONE (1) TABLET BY MOUTH TWO (2) TIMES DAILY WITH A MEAL Patient taking differently: TAKE 325 MG BY MOUTH TWO (2) TIMES DAILY  WITH A MEAL 03/03/17  Yes [name], MD  hydrALAZINE (APRESOLINE) 100 MG tablet TAKE ONE TABLET BY MOUTH EVERY EIGHT HOURS Patient taking differently: No sig reported 11/14/16  Yes Gambino, Arlie Solomons, MD  insulin aspart (NOVOLOG FLEXPEN) 100 UNIT/ML FlexPen Inject 15 Units 2 (two) times daily before lunch and supper into the skin. 12/15/16  Yes Hensel, Jamal Collin, MD  ipratropium-albuterol (DUONEB) 0.5-2.5 (3) MG/3ML SOLN Take 3 mLs by nebulization 4 (four) times daily.   Yes [provider]  isosorbide mononitrate (IMDUR) 60 MG 24 hr tablet TAKE ONE (1) TABLET BY MOUTH EVERY DAY Patient taking differently: TAKE ONE TABLET (36m) BY MOUTH EVERY DAY 01/14/17  Yes Bensimhon, DShaune Pascal MD  levothyroxine (SYNTHROID, LEVOTHROID) 100 MCG tablet Take 100 mcg by mouth daily before breakfast.   Yes [provider]  nitroGLYCERIN (NITROSTAT) 0.4 MG SL tablet Place 1 tablet (0.4 mg total) under the tongue every 5 (five) minutes as needed for chest pain. 11/14/16  Yes GCarlyle Dolly MD  pantoprazole (PROTONIX) 40 MG tablet TAKE ONE (1) TABLET BY MOUTH EVERY  DAY Patient taking differently: TAKE ONE TABLET (427m BY MOUTH EVERY DAY 01/13/17  Yes GaCarlyle DollyMD  potassium chloride SA (K-DUR,KLOR-CON) 20 MEQ tablet Take 20 mEq by mouth 2 (two) times daily.    Yes [provider]  predniSONE (DELTASONE) 20 MG tablet Take 40 mg by mouth daily with breakfast.   Yes [provider]  torsemide (DEMADEX) 20 MG tablet Take 20 mg by mouth 2 (two) times daily.   Yes [provider]  Blood Glucose Monitoring Suppl (ONOkanoganw/Device KIT 1 kit by Does not apply route daily. 10/10/16   McDiarmid, ToBlane OharaMD  glucose blood (ONETOUCH VERIO) test strip Use as instructed to test three times daily. ICD-10 code: E11.22 10/15/16   McDiarmid, ToBlane OharaMD  ONHemet EndoscopyELICA LANCETS 3304VISC 1 each by Does not apply route 3 (three) times daily. ICD-10 code: E11.22 10/15/16   McDiarmid, ToBlane OharaMD  oxyCODONE (OXY IR/ROXICODONE) 5 MG immediate release tablet Take 1 tablet (5 mg total) by mouth every 6 (six) hours as needed for moderate pain. Patient not taking: Reported on 07/05/2017 03/17/17   CoUlyses AmorPA-C  traMADol (ULTRAM) 50 MG tablet Take 1 tablet (50 mg total) by mouth every 6 (six) hours as needed for moderate pain. Patient not taking: Reported on 07/05/2017 02/05/17   DuNewt MinionMD  Insulin Glargine (LANTUS SOLOSTAR) 100 UNIT/ML Solostar Pen Inject 50 Units into the skin every morning. Patient taking differently: Inject 50 Units into the skin daily after breakfast.  10/10/16 04/23/17  GaCarlyle DollyMD    Current Facility-Administered Medications  Medication Dose Route Frequency Provider Last Rate Last Dose  . 0.9 %  sodium chloride infusion  250 mL Intravenous PRN YoBufford LopeDO      . acetaminophen (TYLENOL) tablet 650 mg  650 mg Oral Q4H PRN YoBufford LopeDO      . allopurinol (ZYLOPRIM) tablet 100 mg  100 mg Oral Daily Yoo, Elsia J, DO      . amLODipine (NORVASC) tablet 10 mg  10 mg Oral Daily Yoo,  Elsia J, DO      . atorvastatin (LIPITOR) tablet 40 mg  40 mg Oral q1800 YoOrson Eva, DO      . carvedilol (COREG) tablet 25 mg  25 mg Oral BID WC Yoo, Elsia J, DO      .  chlorhexidine (PERIDEX) 0.12 % solution 15 mL  15 mL Mouth Rinse BID Zenia Resides, MD   15 mL at 07/06/17 2239  . cloNIDine (CATAPRES - Dosed in mg/24 hr) patch 0.2 mg  0.2 mg Transdermal Weekly Orson Eva J, DO   0.2 mg at 07/06/17 0000  . doxazosin (CARDURA) tablet 8 mg  8 mg Oral QHS Shawna Orleans, Elsia J, DO      . ferrous sulfate tablet 325 mg  325 mg Oral BID WC Shawna Orleans, Elsia J, DO      . furosemide (LASIX) injection 80 mg  80 mg Intravenous BID Orson Eva J, DO   80 mg at 07/06/17 1826  . heparin ADULT infusion 100 units/mL (25000 units/282m sodium chloride 0.45%)  1,150 Units/hr Intravenous Continuous HZenia Resides MD 11.5 mL/hr at 07/07/17 0103 1,150 Units/hr at 07/07/17 0103  . hydrALAZINE (APRESOLINE) injection 20 mg  20 mg Intravenous Q4H PRN WKathrene Alu MD   20 mg at 07/06/17 2235  . hydrALAZINE (APRESOLINE) tablet 100 mg  100 mg Oral Q8H Yoo, Elsia J, DO      . insulin aspart (novoLOG) injection 0-9 Units  0-9 Units Subcutaneous TID WC YOrson EvaJ, DO   5 Units at 07/06/17 1825  . ipratropium-albuterol (DUONEB) 0.5-2.5 (3) MG/3ML nebulizer solution 3 mL  3 mL Nebulization Q6H PRN YBufford Lope DO      . isosorbide mononitrate (IMDUR) 24 hr tablet 60 mg  60 mg Oral Daily Yoo, Elsia J, DO      . levothyroxine (SYNTHROID, LEVOTHROID) tablet 100 mcg  100 mcg Oral QAC breakfast YShawna Orleans Elsia J, DO      . MEDLINE mouth rinse  15 mL Mouth Rinse q12n4p Hensel, WJamal Collin MD      . MEDLINE mouth rinse  15 mL Mouth Rinse q12n4p Hensel, WJamal Collin MD      . ondansetron (ZOFRAN) injection 4 mg  4 mg Intravenous Q6H PRN YBufford Lope DO      . pantoprazole (PROTONIX) EC tablet 40 mg  40 mg Oral Daily Yoo, Elsia J, DO      . sodium chloride flush (NS) 0.9 % injection 3 mL  3 mL Intravenous Q12H Yoo, Elsia J, DO   3 mL at  07/06/17 1119  . sodium chloride flush (NS) 0.9 % injection 3 mL  3 mL Intravenous PRN YBufford Lope DO       Facility-Administered Medications Ordered in Other Encounters  Medication Dose Route Frequency Provider Last Rate Last Dose  . sodium chloride flush (NS) 0.9 % injection 10 mL  10 mL Intracatheter PRN GAlvy Bimler Ni, MD         VITAL SIGNS: BP (!) 155/67   Pulse 76   Temp (!) 97.5 F (36.4 C) (Axillary)   Resp (!) 41   Ht '5\' 8"'  (1.727 m)   Wt 94.6 kg (208 lb 8.9 oz)   SpO2 93%   BMI 31.71 kg/m   VENTILATOR SETTINGS: FiO2 (%):  [50 %-70 %] 70 %  INTAKE / OUTPUT: I/O last 3 completed shifts: In: 28.9 [I.V.:28.9] Out: 33662[Urine:3655]  PHYSICAL EXAMINATION: GENERAL: alert, oriented to person, place, and time, affect appropriate to mood. On BiPAP 12/8. HEAD: normocephalic, atraumatic EYE: PERRLA, EOM intact, no scleral icterus, no pallor. NOSE: nares are patent. No exudate. No sinus tenderness. THROAT/ORAL CAVITY: Normal dentition. No oral thrush. No exudate. Mucous membranes are moist. No tonsillar enlargement. Mallampati class cannot be determined at this  time due to BiPAP dependence.  NECK: supple, no thyromegaly, no JVD, no lymphadenopathy. Trachea midline. CHEST/LUNG: symmetric in development and expansion. Coarse breath sounds. Bilateral rales and rhonchi. HEART: Regular S1 and S2 without murmur, rub or gallop. ABDOMEN: soft, nontender, nondistended. Normoactive bowel sounds. No rebound. No guarding. No hepatosplenomegaly. EXTREMITIES: Edema: 3+. No cyanosis. No clubbing. L AKA. L UE AV graft with good thrill. LYMPHATIC: no cervical/axiallary/inguinal lymph nodes appreciated MUSCULOSKELETAL: No point tenderness. No bulk atrophy.  SKIN:  No rash or lesion. NEUROLOGIC: Doll's eyes intact. Corneal reflex intact. Spontaneous respirations intact. Cranial nerves II-XII are grossly symmetric and physiologic. R LE paresthesia.  LABS:  ABG Recent Labs  Lab  07/06/17 1625 07/07/17 0400  PHART 7.384 7.336*  PCO2ART 32.5 31.4*  PO2ART 78.5* 57.1*    Cardiac Enzymes Lab Results  Component Value Date   TROPIPOC 0.02 07/05/2017   Lab Results  Component Value Date   BNP 464.2 (H) 07/07/2017   BNP 595.9 (H) 07/05/2017   BNP 202.1 (H) 76/22/6333    BASIC METABOLIC PROFILE Recent Labs  Lab 07/05/17 1036 07/06/17 0347  NA 140 144  K 4.5 4.5  CL 112* 116*  CO2 19* 19*  BUN 85* 87*  CREATININE 2.39* 2.43*  GLUCOSE 307* 267*  CALCIUM 8.2* 8.3*    Glucose Recent Labs  Lab 07/05/17 1820 07/05/17 1938 07/06/17 0749 07/06/17 1119 07/06/17 1648 07/06/17 1956  GLUCAP 234* 237* 259* 235* 278* 248*    CBC Recent Labs  Lab 07/05/17 1036 07/06/17 1028 07/07/17 0432  WBC 12.2* 12.1* 11.4*  HGB 7.8* 7.3* 7.0*  HCT 26.4* 24.4* 23.7*  PLT 113* 109* 107*    COAGULATION STUDIES Recent Labs  Lab 07/05/17 1036 07/06/17 1500 07/06/17 2306 07/07/17 0432  APTT  --  37* 65* 67*  INR 1.37  --   --   --     CULTURES: Results for orders placed or performed during the hospital encounter of 03/12/17  Surgical PCR screen     Status: None   Collection Time: 03/12/17 11:50 PM  Result Value Ref Range Status   MRSA, PCR NEGATIVE NEGATIVE Final   Staphylococcus aureus NEGATIVE NEGATIVE Final    Comment: (NOTE) The Xpert SA Assay (FDA approved for NASAL specimens in patients 1 years of age and older), is one component of a comprehensive surveillance program. It is not intended to diagnose infection nor to guide or monitor treatment. Performed at [facility], [place] [address], [place], [place] [address]    *Note: Due to a large number of results and/or encounters for the requested time period, some results have not been displayed. A complete set of results can be found in Results Review.    IMAGING: Dg Chest Port 1 View  Result Date: 07/07/2017 CLINICAL DATA:  Increasing shortness of breath, respiratory distress. EXAM:  PORTABLE CHEST 1 VIEW COMPARISON:  Chest radiograph July 05, 2017 FINDINGS: Cardiac silhouette is mildly enlarged. Calcified aortic knob. Status post median sternotomy. Worsening interstitial and alveolar airspace opacities with small to moderate right pleural effusion, left costophrenic angle out of field of view. No pneumothorax. Subclavian vascular calcifications. Osseous structures are unchanged. IMPRESSION: Stable cardiomegaly. Worsening interstitial and alveolar airspace opacities concerning for pulmonary edema and/or ARDS. Small to moderate right pleural effusion. Aortic Atherosclerosis (ICD10-I70.0). Electronically Signed   By: Elon Alas M.D.   On: 07/07/2017 03:51    SIGNIFICANT EVENTS: 6/2: admitted with increased dyspnea and started on diuresis and BiPAP support for CHF  exacerbation. 6/4: rapid response called for increased work of breathing. Remains BiPAP dependent. Transfer to ICU for closer monitoring. May require intubation.   ASSESSMENT / PLAN: Principal Problem:   Acute respiratory failure with hypoxia (HCC) Active Problems:   Acute exacerbation of CHF (congestive heart failure) (HCC)   Essential hypertension   CKD (chronic kidney disease) stage 3, GFR 30-59 ml/min (HCC)   Chronic anemia   COPD GOLD III    PULMONARY  ACUTE HYPOXEMIC RESPIRATORY FAILURE  ABNORMAL CHEST X-RAY/PULMONARY EDEMA  R PLEURAL EFFUSION  GOLD STAGE III COPD secondary to tobacco abuse history Transfer to ICU (2H). Change DuoNeb to q 6 hr scheduled Repeat ABG 1 hr after sodium bicarbonate administration Check procalcitonin Morphine 1 mg IV q 4hr PRN for air hunger/pulmonary edema.   CARDIOVASCULAR  CHF EXACERBATION  VOLUME OVERLOAD Strict I/O. Insert Foley catheter. Continue furosemide 80 mg IV BID. Will give additional Bumex 2 mg IV single dose. Off PO antihypertensives due to BiPAP dependence. Will use hydralazine PRN, labetalol PRN.  RENAL  CHRONIC KIDNEY DISEASE, STAGE  III  PARTIALLY COMPENSATED NON-ANION GAP METABOLIC ACIDOSIS Will try bicarbonate administration to help alleviate work of breathing, but will avoid continuous infusion for now due to concerns for volume overload. Monitor U/O. Follow serum Cr.  HEMATOLOGIC  CHRONIC NORMOCYTIC ANEMIA/ANEMIA OF CHRONIC DISEASE  MONOCLONAL GAMMOPATHY OF UNKNOWN SIGNIFICANCE On heparin gtt (instead of Eliquis) for H/O DVT. Monitor Hgb. Transfuse 2 U PRBC if Hgb falls under 7.  INFECTIOUS  LEUKOCYTOSIS, unknown clinical significance Check U/A, blood cultures.  ENDOCRINE  TYPE 2 DIABETES MELLITUS with NEPHROPATHY, NEUROPATHY, VASCULAR DISEASE Sliding scale insulin  DVT PROPHYLAXIS: on heparin gtt for secondary prophylaxis  GI PROPHYLAXIS: change Protonix PO to famotidine IV (renally dosed)  NUTRITION: NPO at this time due to BiPAP dependence.   FAMILY  - Updates: no family at bedside  - Inter-disciplinary family meet or Palliative Care meeting due by: 07/13/2017  Renee Pain, MD Board Certified by the ABIM, [place] Pager: [phone]  07/07/2017, 5:16 AM  Critical care time: 75 minutes. The treatment and management of the patient's condition was required based on the threat of imminent deterioration. This time reflects time spent by the physician evaluating, providing care and managing the critically ill patient's care. The time was spent at the immediate bedside (or on the same floor/unit and dedicated to this patient's care). Time involved in separately billable procedures is NOT included int he critical care time indicated above. Family meeting and update time may be included above if and only if the patient is unable/incompetent to participate in clinical interview and/or decision making, and the discussion was necessary to determining treatment decisions.  Renee Pain, MD

## 2017-07-07 NOTE — Progress Notes (Signed)
FPTS Social Note  Leonard [name] was transferred to CCM overnight on 6/3 due to worsening respiratory status.  On 6/4, he remains on Bipap but continues to appear uncomfortable.  FTPS will continue to follow and resume care when patient is transferred back to the floor.  Appreciate excellent care provided by CCM.  Leonard Melchior [name], Leonard [name] PGY-1, [facility] 07/07/2017 8:55 AM

## 2017-07-07 NOTE — Progress Notes (Signed)
Placed patient on [place] per MD at bedside. Currently on 60L, 100% FIO2. Repeat ABG in one hour. Spo2 maintaing at 95%

## 2017-07-07 NOTE — Progress Notes (Signed)
Rapid Response from 6E. Arrived on BIPAP 16/8 BUR 8 FIO2. MD at bedside. Weaned to 80%, 14/8.

## 2017-07-07 NOTE — Plan of Care (Signed)
Patient still has frequent episodes of agitation and pulls off bipap mask, still attempts to get OOB, does not follow instructions

## 2017-07-07 NOTE — Progress Notes (Signed)
Inpatient Diabetes Program Recommendations  AACE/ADA: New Consensus Statement on Inpatient Glycemic Control (2015)  Target Ranges:  Prepandial:   less than 140 mg/dL      Peak postprandial:   less than 180 mg/dL (1-2 hours)      Critically ill patients:  140 - 180 mg/dL   Lab Results  Component Value Date   GLUCAP 308 (H) 07/07/2017   HGBA1C 6.9 (H) 07/05/2017    Review of Glycemic Control Results for Leonard [name], Leonard [name] (MRN [id number]) as of 07/07/2017 09:07  Ref. Range 07/06/2017 11:19 07/06/2017 16:48 07/06/2017 19:56 07/07/2017 07:46  Glucose-Capillary Latest Ref Range: 65 - 99 mg/dL 235 (H) 278 (H) 248 (H) 308 (H)   Diabetes history: Type 2 DM Outpatient Diabetes medications: Novolog 15 units at lunch and supper Current orders for Inpatient glycemic control: Novolog 0-9 units TID  Inpatient Diabetes Program Recommendations:    Recommending adding Lantus 18 units QD and Novolog 0-5 units QHS.   Thanks, Bronson Curb, MSN, RNC-OB Diabetes Coordinator [phone] (8a-5p)

## 2017-07-07 NOTE — Progress Notes (Addendum)
OVERNIGHT COVERAGE CRITICAL CARE PROGRESS NOTE  SUBJECTIVE:  CTSP re: chest pain (8/10). Spontaneously resolved. No hemodynamic changes. Continues to complain of dry mouth. 4-beat run of NSVT observed at the bedside. On increased diuretics. K 4.7 this am. Mg level was not checked.   OBJECTIVE:  [P/E] Awake and alert. Continues to have coarse breath sounds but improved from earlier today.  [DATA] 12-lead EKG: No significant change from previous EKG obtained on 6/2. ABG @2001  7.45/31/50 on BiPAP 14/8, FiO2 60%.  ASSESSMENT/PLAN CHEST PAIN  Start SL NTG PRN for chest pain (may also help with pulmonary edema).  STAT troponin. NSVT  Check K, Mg. ACUTE HYPOXEMIC RESPIRATORY FAILURE, slowly improving  Will try transitioning to heated, high-flow cannula. Repeat ABG in 1 hour.  Family at bedside updated.  Critical care time: 60 minutes. The treatment and management of the patient's condition was required based on the threat of imminent deterioration. This time reflects time spent by the physician evaluating, providing care and managing the critically ill patient's care. The time was spent at the immediate bedside (or on the same floor/unit and dedicated to this patient's care). Time involved in separately billable procedures is NOT included int he critical care time indicated above. Family meeting and update time may be included above if and only if the patient is unable/incompetent to participate in clinical interview and/or decision making, and the discussion was necessary to determining treatment decisions.  Renee Pain, MD Board Certified by the ABIM, [place] ([id number]) Troponin 0.04. Will trend troponin. Already on heparin gtt for secondary prophylaxis of DVT. Restarting carvedilol 25 mg PO BID (home med). Restart Lipitor 40 mg PO daily (home med). No ACE-I/ARB at this time due to CKD. Will need to re-assess prior to discharge.

## 2017-07-07 NOTE — Progress Notes (Signed)
Patient arrived from [place] on Bipap in respiratory distress. CCM MD at bedside giving orders.

## 2017-07-07 NOTE — Progress Notes (Signed)
Family Medicine progress note  Met CCM provider at bedside. CCM provider increased IPAP and increased O2% to 100. Patient with slightly improved work of breathing, but did intermittently fall asleep while in room. Decision made to transfer patient to ICU ([place]) due to tenuous nature of his respiratory status. Repeat ABG performed with BiPAP setting changes as above.  ABG: 7.28/35/152/16. Worsening metabolic acidosis. After transfer to ICU rest of care per CCM, their consult note to follow.  Guadalupe Dawn MD PGY-1 Family Medicine Resident

## 2017-07-07 NOTE — Significant Event (Signed)
Rapid Response Event Note  Overview: Called by RT d/t respiratory distress Time Called: 0316 Arrival Time: 0320 Event Type: Respiratory  Initial Focused Assessment: Pt sitting in bed on Bipap with RR 38 and accessory muscle use noted. VS: HR SR 76, BP 175/57, O2 Sats 98% on 70% (spO2 pta RRT 86% on Bipap 50%). Lungs rales/rhonchi throughout. Pt A&Ox4 however making strange requests.   Interventions: CXR- pulmonary edema and/or ARDS Abg- 7.3/31.4/57.1/16.5 Dr Kris Mouton at bedside. 40mg  Lasix IV given. CCM consulted, adjustments made to Bipap, repeat Abg 7.27/35.5/152/16.3  Plan of Care (if not transferred): Pt transferred to [facility]   Event Summary: Name of Physician Notified: Dr Kris Mouton at 0400  Name of Consulting Physician Notified: Dr Carson Myrtle CCMD at 0500  Outcome: Transferred (Comment)(2H16)  Event End Time: 9276  Dillard Essex

## 2017-07-07 NOTE — Progress Notes (Signed)
Family Medicine progress update  Called to beside for worsening respiratory status. Rapid response already and bedside, appreciate their help. Reviewed chest xray which was acutely worsened from 6/2, with much more opacity consistent with worsening pulmonary edema vs ards.  When at bedside patient working much harder to breath and having trouble maintaining consciousness. ABG draw which was  7.336/31/57/16, worse from earlier especially PO2. Patient with crackles present in all lung fields which is worse from previous exam. Given patient's prolonged BiPAP worsening respiratory status on BiPAP, will ask PCCM to come and evaluate patient. Believe that patient has very little reserve at this point and will likely tire out very quickly based on current work of breathing.   Will give 1 dose 40mg  IV lasix in the meantime to help with continued diuresis. Patient has had good diuresis up to this point having a total of about 5L out. Appreciate PCCM recs.  Guadalupe Dawn MD PGY-1 Family Medicine Resident

## 2017-07-08 ENCOUNTER — Inpatient Hospital Stay (HOSPITAL_COMMUNITY): Payer: Medicare Other

## 2017-07-08 ENCOUNTER — Telehealth (HOSPITAL_COMMUNITY): Payer: Self-pay | Admitting: Surgery

## 2017-07-08 LAB — GLUCOSE, CAPILLARY
GLUCOSE-CAPILLARY: 264 mg/dL — AB (ref 65–99)
GLUCOSE-CAPILLARY: 233 mg/dL — AB (ref 65–99)
GLUCOSE-CAPILLARY: 150 mg/dL — AB (ref 65–99)
Glucose-Capillary: 189 mg/dL — ABNORMAL HIGH (ref 65–99)

## 2017-07-08 LAB — CBC WITH DIFFERENTIAL/PLATELET
Abs Immature Granulocytes: 0.1 10*3/uL (ref 0.0–0.1)
BASOS ABS: 0 10*3/uL (ref 0.0–0.1)
BASOS PCT: 0 %
EOS PCT: 0 %
Eosinophils Absolute: 0 10*3/uL (ref 0.0–0.7)
HCT: 21 % — ABNORMAL LOW (ref 39.0–52.0)
Hemoglobin: 6.3 g/dL — CL (ref 13.0–17.0)
Immature Granulocytes: 1 %
Lymphocytes Relative: 3 %
Lymphs Abs: 0.3 10*3/uL — ABNORMAL LOW (ref 0.7–4.0)
MCH: 26.1 pg (ref 26.0–34.0)
MCHC: 30 g/dL (ref 30.0–36.0)
MCV: 87.1 fL (ref 78.0–100.0)
MONO ABS: 0.4 10*3/uL (ref 0.1–1.0)
MONOS PCT: 5 %
Neutro Abs: 8.9 10*3/uL — ABNORMAL HIGH (ref 1.7–7.7)
Neutrophils Relative %: 91 %
PLATELETS: 107 10*3/uL — AB (ref 150–400)
RBC: 2.41 MIL/uL — ABNORMAL LOW (ref 4.22–5.81)
RDW: 19.3 % — ABNORMAL HIGH (ref 11.5–15.5)
WBC: 9.7 10*3/uL (ref 4.0–10.5)

## 2017-07-08 LAB — TRIGLYCERIDES: Triglycerides: 67 mg/dL (ref <150)

## 2017-07-08 LAB — POCT I-STAT 3, ART BLOOD GAS (G3+)
ACID-BASE DEFICIT: 5 mmol/L — AB (ref 0.0–2.0)
Bicarbonate: 21.5 mmol/L (ref 20.0–28.0)
O2 SAT: 98 %
PH ART: 7.304 — AB (ref 7.350–7.450)
TCO2: 23 mmol/L (ref 22–32)
pCO2 arterial: 42.7 mmHg (ref 32.0–48.0)
pO2, Arterial: 110 mmHg — ABNORMAL HIGH (ref 83.0–108.0)

## 2017-07-08 LAB — BASIC METABOLIC PANEL
Anion gap: 10 (ref 5–15)
BUN: 95 mg/dL — AB (ref 6–20)
CALCIUM: 8.4 mg/dL — AB (ref 8.9–10.3)
CO2: 21 mmol/L — ABNORMAL LOW (ref 22–32)
CREATININE: 2.37 mg/dL — AB (ref 0.61–1.24)
Chloride: 120 mmol/L — ABNORMAL HIGH (ref 101–111)
GFR, EST AFRICAN AMERICAN: 31 mL/min — AB (ref >60)
GFR, EST NON AFRICAN AMERICAN: 27 mL/min — AB (ref >60)
Glucose, Bld: 190 mg/dL — ABNORMAL HIGH (ref 65–99)
Potassium: 4.3 mmol/L (ref 3.5–5.1)
SODIUM: 151 mmol/L — AB (ref 135–145)

## 2017-07-08 LAB — TROPONIN I
TROPONIN I: 0.04 ng/mL — AB (ref <0.03)
TROPONIN I: 0.03 ng/mL — AB (ref <0.03)
Troponin I: 0.03 ng/mL (ref <0.03)

## 2017-07-08 LAB — PHOSPHORUS: Phosphorus: 4.9 mg/dL — ABNORMAL HIGH (ref 2.5–4.6)

## 2017-07-08 LAB — MAGNESIUM: MAGNESIUM: 2.5 mg/dL — AB (ref 1.7–2.4)

## 2017-07-08 LAB — POTASSIUM: Potassium: 4 mmol/L (ref 3.5–5.1)

## 2017-07-08 LAB — HEPARIN LEVEL (UNFRACTIONATED): HEPARIN UNFRACTIONATED: 1.12 [IU]/mL — AB (ref 0.30–0.70)

## 2017-07-08 LAB — PREPARE RBC (CROSSMATCH)

## 2017-07-08 LAB — APTT: APTT: 99 s — AB (ref 24–36)

## 2017-07-08 MED ORDER — DOXAZOSIN MESYLATE 8 MG PO TABS
8.0000 mg | ORAL_TABLET | Freq: Every day | ORAL | Status: DC
  Administered 2017-07-09 – 2017-07-15 (×8): 8 mg
  Filled 2017-07-08 (×9): qty 1

## 2017-07-08 MED ORDER — MIDAZOLAM HCL 2 MG/2ML IJ SOLN
INTRAMUSCULAR | Status: AC
  Filled 2017-07-08: qty 4

## 2017-07-08 MED ORDER — HYDRALAZINE HCL 50 MG PO TABS
100.0000 mg | ORAL_TABLET | Freq: Three times a day (TID) | ORAL | Status: DC
  Administered 2017-07-08 – 2017-07-16 (×23): 100 mg
  Filled 2017-07-08 (×23): qty 2

## 2017-07-08 MED ORDER — WHITE PETROLATUM EX OINT
TOPICAL_OINTMENT | CUTANEOUS | Status: AC
  Administered 2017-07-08: 0.2
  Filled 2017-07-08: qty 28.35

## 2017-07-08 MED ORDER — MIDAZOLAM HCL 2 MG/2ML IJ SOLN
2.0000 mg | Freq: Once | INTRAMUSCULAR | Status: AC
  Administered 2017-07-08: 2 mg via INTRAVENOUS

## 2017-07-08 MED ORDER — FENTANYL 2500MCG IN NS 250ML (10MCG/ML) PREMIX INFUSION
0.0000 ug/h | INTRAVENOUS | Status: DC
  Administered 2017-07-08: 50 ug/h via INTRAVENOUS
  Administered 2017-07-09: 100 ug/h via INTRAVENOUS
  Filled 2017-07-08 (×2): qty 250

## 2017-07-08 MED ORDER — ALLOPURINOL 100 MG PO TABS
100.0000 mg | ORAL_TABLET | Freq: Every day | ORAL | Status: DC
  Administered 2017-07-09 – 2017-07-15 (×7): 100 mg
  Filled 2017-07-08 (×7): qty 1

## 2017-07-08 MED ORDER — MIDAZOLAM HCL 2 MG/2ML IJ SOLN
INTRAMUSCULAR | Status: AC
  Filled 2017-07-08: qty 2

## 2017-07-08 MED ORDER — VANCOMYCIN HCL IN DEXTROSE 750-5 MG/150ML-% IV SOLN
750.0000 mg | INTRAVENOUS | Status: DC
  Administered 2017-07-09 – 2017-07-10 (×2): 750 mg via INTRAVENOUS
  Filled 2017-07-08 (×3): qty 150

## 2017-07-08 MED ORDER — CARVEDILOL 25 MG PO TABS
25.0000 mg | ORAL_TABLET | Freq: Two times a day (BID) | ORAL | Status: DC
Start: 2017-07-09 — End: 2017-07-16
  Administered 2017-07-09 – 2017-07-16 (×14): 25 mg
  Filled 2017-07-08 (×15): qty 1

## 2017-07-08 MED ORDER — ISOSORBIDE DINITRATE 10 MG PO TABS
20.0000 mg | ORAL_TABLET | Freq: Three times a day (TID) | ORAL | Status: DC
  Administered 2017-07-08 – 2017-07-15 (×22): 20 mg
  Filled 2017-07-08 (×22): qty 2

## 2017-07-08 MED ORDER — PROPOFOL 1000 MG/100ML IV EMUL
INTRAVENOUS | Status: AC
  Filled 2017-07-08: qty 100

## 2017-07-08 MED ORDER — PROPOFOL 1000 MG/100ML IV EMUL
5.0000 ug/kg/min | INTRAVENOUS | Status: DC
  Administered 2017-07-08: 20 ug/kg/min via INTRAVENOUS
  Administered 2017-07-08: 30 ug/kg/min via INTRAVENOUS
  Administered 2017-07-09: 40 ug/kg/min via INTRAVENOUS
  Filled 2017-07-08 (×4): qty 100

## 2017-07-08 MED ORDER — ACETAMINOPHEN 160 MG/5ML PO SOLN
650.0000 mg | ORAL | Status: DC | PRN
  Administered 2017-07-08 – 2017-07-18 (×2): 650 mg
  Filled 2017-07-08 (×2): qty 20.3

## 2017-07-08 MED ORDER — FERROUS SULFATE 300 (60 FE) MG/5ML PO SYRP
300.0000 mg | ORAL_SOLUTION | Freq: Two times a day (BID) | ORAL | Status: DC
  Administered 2017-07-09 – 2017-07-16 (×15): 300 mg
  Filled 2017-07-08 (×15): qty 5

## 2017-07-08 MED ORDER — LEVOTHYROXINE SODIUM 100 MCG PO TABS
100.0000 ug | ORAL_TABLET | Freq: Every day | ORAL | Status: DC
  Administered 2017-07-09: 100 ug
  Filled 2017-07-08: qty 1

## 2017-07-08 MED ORDER — FENTANYL CITRATE (PF) 100 MCG/2ML IJ SOLN
100.0000 ug | Freq: Once | INTRAMUSCULAR | Status: AC
  Administered 2017-07-08: 100 ug via INTRAVENOUS

## 2017-07-08 MED ORDER — SODIUM CHLORIDE 0.9 % IV SOLN
Freq: Once | INTRAVENOUS | Status: DC
Start: 2017-07-08 — End: 2017-07-10

## 2017-07-08 MED ORDER — MIDAZOLAM HCL 2 MG/2ML IJ SOLN
2.0000 mg | Freq: Once | INTRAMUSCULAR | Status: AC
Start: 2017-07-08 — End: 2017-07-08
  Administered 2017-07-08: 2 mg via INTRAVENOUS

## 2017-07-08 MED ORDER — ETOMIDATE 2 MG/ML IV SOLN
0.3000 mg/kg | Freq: Once | INTRAVENOUS | Status: AC
  Administered 2017-07-08: 20 mg via INTRAVENOUS

## 2017-07-08 MED ORDER — FENTANYL CITRATE (PF) 100 MCG/2ML IJ SOLN
INTRAMUSCULAR | Status: AC
  Filled 2017-07-08: qty 2

## 2017-07-08 MED ORDER — PIPERACILLIN-TAZOBACTAM 3.375 G IVPB 30 MIN
3.3750 g | Freq: Once | INTRAVENOUS | Status: DC
  Filled 2017-07-08: qty 50

## 2017-07-08 MED ORDER — ATORVASTATIN CALCIUM 40 MG PO TABS
40.0000 mg | ORAL_TABLET | Freq: Every day | ORAL | Status: DC
  Administered 2017-07-09 – 2017-07-15 (×7): 40 mg
  Filled 2017-07-08 (×7): qty 1

## 2017-07-08 MED ORDER — PIPERACILLIN-TAZOBACTAM 3.375 G IVPB
3.3750 g | Freq: Three times a day (TID) | INTRAVENOUS | Status: DC
  Administered 2017-07-08 – 2017-07-13 (×15): 3.375 g via INTRAVENOUS
  Filled 2017-07-08 (×15): qty 50

## 2017-07-08 MED ORDER — VANCOMYCIN HCL IN DEXTROSE 1-5 GM/200ML-% IV SOLN
1000.0000 mg | Freq: Once | INTRAVENOUS | Status: AC
  Administered 2017-07-08: 1000 mg via INTRAVENOUS
  Filled 2017-07-08: qty 200

## 2017-07-08 MED ORDER — ORAL CARE MOUTH RINSE
15.0000 mL | OROMUCOSAL | Status: DC
  Administered 2017-07-08 – 2017-07-12 (×36): 15 mL via OROMUCOSAL

## 2017-07-08 MED ORDER — AMLODIPINE BESYLATE 10 MG PO TABS
10.0000 mg | ORAL_TABLET | Freq: Every day | ORAL | Status: DC
  Administered 2017-07-09 – 2017-07-15 (×7): 10 mg
  Filled 2017-07-08 (×8): qty 1

## 2017-07-08 NOTE — Progress Notes (Signed)
Patient co 8/10 chest pain. PRN morphine given. EKG completed. CCM at bedside to assess. Orders for SL nitro, troponin level, potassium level, mag level, and ABG.

## 2017-07-08 NOTE — Progress Notes (Addendum)
CRITICAL VALUE ALERT  Critical Value:  6.3, Hgb  Date & Time Notied:  07/08/2017, 0400  Provider Notified: Elink MD  Orders Received/Actions taken: 1 unit PRBCs

## 2017-07-08 NOTE — Progress Notes (Signed)
ANTICOAGULATION CONSULT NOTE   Pharmacy Consult for Heparin Indication: h/o chronic DVT  No Known Allergies  Patient Measurements: Height: 5' 8" (172.7 cm) Weight: 200 lb 6.4 oz (90.9 kg) IBW/kg (Calculated) : 68.4 Heparin Dosing Weight: 89.5 kg  Vital Signs: Temp: 97.6 F (36.4 C) (06/05 0332) Temp Source: Oral (06/05 0332) BP: 155/64 (06/05 0600) Pulse Rate: 65 (06/05 0600)  Labs: Recent Labs    07/05/17 1036 07/06/17 0347 07/06/17 1028  07/06/17 1500 07/06/17 2306 07/07/17 0432 07/07/17 2106 07/08/17 0256  HGB 7.8*  --  7.3*  --   --   --  7.0*  --  6.3*  HCT 26.4*  --  24.4*  --   --   --  23.7*  --  21.0*  PLT 113*  --  109*  --   --   --  107*  --  107*  APTT  --   --   --    < > 37* 65* 67*  --  99*  LABPROT 16.8*  --   --   --   --   --   --   --   --   INR 1.37  --   --   --   --   --   --   --   --   HEPARINUNFRC  --   --   --   --  1.16*  --  1.02*  --  1.12*  CREATININE 2.39* 2.43*  --   --   --   --  2.51*  --  2.37*  TROPONINI  --   --   --   --   --   --   --  0.04* 0.04*   < > = values in this interval not displayed.    Estimated Creatinine Clearance: 33.6 mL/min (A) (by C-G formula based on SCr of 2.37 mg/dL (H)).   Medical History: Past Medical History:  Diagnosis Date  . Cancer ([place])    skin cancer-  . CHF (congestive heart failure) ([place])   . Chronic kidney disease    not on dialysis  . Coronary artery disease   . Gangrene of toe ([place])    right 5th/notes 01/04/2013   . High cholesterol   . Hypertension   . Iron deficiency anemia 08/03/2015  . 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 PAD (peripheral artery disease) ([place])   . Shortness of breath dyspnea    with exertion  . Type II diabetes mellitus (HCC)    Type II    Medications:  Medications Prior to Admission  Medication Sig Dispense Refill Last Dose  . allopurinol (ZYLOPRIM) 100 MG tablet Take 100 mg by mouth daily.   07/04/2017 at Unknown time  .  amLODipine (NORVASC) 10 MG tablet Take 1 tablet (10 mg total) by mouth daily. Needs office visit for further refills 30 tablet 0 07/04/2017 at Unknown time  . atorvastatin (LIPITOR) 40 MG tablet TAKE ONE (1) TABLET BY MOUTH EVERY DAY AT 6:00PM (Patient taking differently: Take 40 mg by mouth once a day at 6 PM) 90 tablet 3 07/04/2017 at Unknown time  . carvedilol (COREG) 25 MG tablet TAKE TWO (2) TABLETS BY MOUTH 2 TIMES DAILY WITH A MEAL (Patient taking differently: TAKE TWO TABLETS (45m) BY MOUTH 2 TIMES DAILY WITH A MEAL) 120 tablet 3 07/04/2017 at 1800  . cloNIDine (CATAPRES) 0.2 MG tablet TAKE TWO (2) TABLETS BY MOUTH 2 TIMES DAILY 120 tablet  3 07/04/2017 at 1800  . doxazosin (CARDURA) 8 MG tablet Take 1 tablet (8 mg total) by mouth every evening. (Patient taking differently: Take 8 mg by mouth at bedtime. )   07/04/2017 at Unknown time  . ELIQUIS 2.5 MG TABS tablet Take 2.5 mg by mouth 2 (two) times daily.   07/04/2017 at 0800  . FEROSUL 325 (65 Fe) MG tablet TAKE ONE (1) TABLET BY MOUTH TWO (2) TIMES DAILY WITH A MEAL (Patient taking differently: TAKE 325 MG BY MOUTH TWO (2) TIMES DAILY WITH A MEAL) 60 tablet 3 07/04/2017 at Unknown time  . hydrALAZINE (APRESOLINE) 100 MG tablet TAKE ONE TABLET BY MOUTH EVERY EIGHT HOURS (Patient taking differently: No sig reported) 90 tablet 1 07/04/2017 at 1400  . insulin aspart (NOVOLOG FLEXPEN) 100 UNIT/ML FlexPen Inject 15 Units 2 (two) times daily before lunch and supper into the skin. 15 mL 5 07/04/2017 at 1800  . ipratropium-albuterol (DUONEB) 0.5-2.5 (3) MG/3ML SOLN Take 3 mLs by nebulization 4 (four) times daily.   07/04/2017 at 1700  . isosorbide mononitrate (IMDUR) 60 MG 24 hr tablet TAKE ONE (1) TABLET BY MOUTH EVERY DAY (Patient taking differently: TAKE ONE TABLET (6m) BY MOUTH EVERY DAY) 30 tablet 3 07/04/2017 at Unknown time  . levothyroxine (SYNTHROID, LEVOTHROID) 100 MCG tablet Take 100 mcg by mouth daily before breakfast.   07/04/2017 at Unknown time  .  nitroGLYCERIN (NITROSTAT) 0.4 MG SL tablet Place 1 tablet (0.4 mg total) under the tongue every 5 (five) minutes as needed for chest pain. 30 tablet 12 unk at prn  . pantoprazole (PROTONIX) 40 MG tablet TAKE ONE (1) TABLET BY MOUTH EVERY DAY (Patient taking differently: TAKE ONE TABLET (440m BY MOUTH EVERY DAY) 90 tablet 0 07/04/2017 at Unknown time  . potassium chloride SA (K-DUR,KLOR-CON) 20 MEQ tablet Take 20 mEq by mouth 2 (two) times daily.    07/04/2017 at Unknown time  . predniSONE (DELTASONE) 20 MG tablet Take 40 mg by mouth daily with breakfast.   07/04/2017 at Unknown time  . torsemide (DEMADEX) 20 MG tablet Take 20 mg by mouth 2 (two) times daily.   07/04/2017 at Unknown time  . Blood Glucose Monitoring Suppl (ONETOUCH VERIO FLEX SYSTEM) w/Device KIT 1 kit by Does not apply route daily. 1 kit 3 Taking  . glucose blood (ONETOUCH VERIO) test strip Use as instructed to test three times daily. ICD-10 code: E11.22 100 each 12 Taking  . ONETOUCH DELICA LANCETS 3333AISC 1 each by Does not apply route 3 (three) times daily. ICD-10 code: E11.22 100 each 12 Taking  . oxyCODONE (OXY IR/ROXICODONE) 5 MG immediate release tablet Take 1 tablet (5 mg total) by mouth every 6 (six) hours as needed for moderate pain. (Patient not taking: Reported on 07/05/2017) 30 tablet 0 Not Taking at Unknown time  . traMADol (ULTRAM) 50 MG tablet Take 1 tablet (50 mg total) by mouth every 6 (six) hours as needed for moderate pain. (Patient not taking: Reported on 07/05/2017) 60 tablet 0 Not Taking at Unknown time    Assessment: [age].o male with h/o chronic DVT on apixaban PTA, last taken pta on 07/04/17 at 0800 per SNF medication records.  Pharmacy consulted to change to IV heparin. Hgb 6.3, pltc 107K, watch.  No bleeding reported and confirmed with nurse.  Am aPTT 99 sec  Goal of Therapy:  aPTT 66-102 seconds Monitor platelets by anticoagulation protocol: Yes   Plan:  Continue heparin drip 1150 units/hr Monitor aPTT due  to  likely effect of apixaban on heparin level.  Daily  APTT and heparin level, and CBC  Alanda Slim, PharmD, [facility] Clinical Pharmacist 07/08/2017,7:02 AM

## 2017-07-08 NOTE — Progress Notes (Signed)
Pharmacy Antibiotic Note  Leonard [name] is a 67 y.o. male admitted on 07/05/2017 with pneumonia.  Pharmacy has been consulted for Vancomycin and Zosyn dosing.  Plan: Vancomycin 1000 mg IV x 1, then Vancomycin 750 IV every 24 hours.  Goal trough 15-20 mcg/mL. Zosyn 3.375g IV q8h (4 hour infusion).  Monitor renal function and C&S. Will draw Vanc trough when appropriate.  Height: 5\' 8"  (172.7 cm) Weight: 200 lb 6.4 oz (90.9 kg) IBW/kg (Calculated) : 68.4  Temp (24hrs), Avg:95.7 F (35.4 C), Min:93.7 F (34.3 C), Max:97.6 F (36.4 C)  Recent Labs  Lab 07/05/17 1036 07/06/17 0347 07/06/17 1028 07/07/17 0432 07/08/17 0256  WBC 12.2*  --  12.1* 11.4* 9.7  CREATININE 2.39* 2.43*  --  2.51* 2.37*    Estimated Creatinine Clearance: 33.6 mL/min (A) (by C-G formula based on SCr of 2.37 mg/dL (H)).    No Known Allergies  Antimicrobials this admission: Vancomycin 6/5 >>  Zosyn 6/5 >>   Microbiology results: 6/4 BCx: ngtd  Thank you for allowing pharmacy to be a part of this patient's care.  Corinda Gubler, PharmD, [facility] 07/08/2017 9:06 AM

## 2017-07-08 NOTE — Progress Notes (Signed)
Pt on HFNC 70L 100% FIO2 sats 86-91%. Patient co SOB, difficulty breathing. Elink camera in to assess patient. No new orders placed.

## 2017-07-08 NOTE — Progress Notes (Addendum)
8177 Emergent intubation at bedside performed.   1015 central line inserted

## 2017-07-08 NOTE — Progress Notes (Signed)
[facility]  [name] MRN: [id number] DOB: 01/16/1951    ADMISSION DATE:  07/05/2017 CONSULTATION DATE:  07/07/2017  REFERRING MD:  Guadalupe Dawn, MD  REASON FOR CONSULTATION:  Respiratory distress  Interval Hx: Patient is off the BIPAP and on highflow nasal canula. He looks in moderate distress and hypothermic. He is negative 1.8 litre.     HISTORY OF PRESENT ILLNESS  This 67 y.o. African American male is seen in consultation at the request of Dr. Guadalupe Dawn for recommendations on further evaluation and management of respiratory distress. The patient was admitted on 07/05/2017 with a clinical impression of CHF exacerbation. He has been BiPAP dependent for about 36 hours or so. Rapid response team was called earlier this evening for increased work of breathing with use of accessory muscles, tachypnea (40s) and increasing FiO2 requirements. The patient was treated with adjustment of BiPAP settings and an additional furosemide dose (40 mg). At the time of clinical interview, the patient remains on BiPAP 12/8, FiO2 50%. RR is 28-30.  SPO2 90-91%.  He is awake, alert and follows commands. He is desperately asking for water; however, the patient precipitously desaturates with removal of the BiPAP mask.  Aside from being thirsty, the patient is tolerating BiPAP therapy quite well.  The service was consulted for consideration of endotracheal intubation and/or transfer to ICU.     VITAL SIGNS: BP (!) 145/59   Pulse 67   Temp (!) 93.9 F (34.4 C)   Resp 17   Ht 5\' 8"  (1.727 m)   Wt 90.9 kg (200 lb 6.4 oz)   SpO2 (!) 89%   BMI 30.47 kg/m   VENTILATOR SETTINGS: FiO2 (%):  [60 %-100 %] 100 %  INTAKE / OUTPUT:  Intake/Output Summary (Last 24 hours) at 07/08/2017 0900 Last data filed at 07/08/2017 0800 Gross per 24 hour  Intake 404.5 ml  Output 1815 ml  Net -1410.5 ml   PHYSICAL EXAMINATION: General: Ill-appearing male currently on highflow nasal  canula looks in distress HEENT: Full facemask in place Neuro: Follows commands able to verbalize CV: Currently in sinus rhythm PULM: Coarse rhonchi upper lobes, crackles bilaterally  NW:GNFA, non-tender, bsx4 active  Extremities: cold/dry, 1+ right lower extremity edema, left AKA with well-healed scar Skin: no rashes or lesions   LABS:  ABG Recent Labs  Lab 07/07/17 0820 07/07/17 2001 07/07/17 2229  PHART 7.338* 7.453* 7.411  PCO2ART 32.0 31.3* 33.5  PO2ART 82.9* 50.0* 53.0*    Cardiac Enzymes Lab Results  Component Value Date   TROPIPOC 0.02 07/05/2017   Lab Results  Component Value Date   BNP 464.2 (H) 07/07/2017   BNP 595.9 (H) 07/05/2017   BNP 202.1 (H) 21/30/8657    BASIC METABOLIC PROFILE Recent Labs  Lab 07/06/17 0347 07/07/17 0432 07/07/17 2106 07/08/17 0256  NA 144 146*  --  151*  K 4.5 4.7 4.2 4.3  CL 116* 115*  --  120*  CO2 19* 16*  --  21*  BUN 87* 91*  --  95*  CREATININE 2.43* 2.51*  --  2.37*  GLUCOSE 267* 342*  --  190*  CALCIUM 8.3* 8.5*  --  8.4*  MG  --   --  2.4 2.5*  PHOS  --   --   --  4.9*    Glucose Recent Labs  Lab 07/06/17 1648 07/06/17 1956 07/07/17 0746 07/07/17 1547 07/07/17 2133 07/08/17 0728  GLUCAP 278* 248* 308* 191* 164* 233*  CBC Recent Labs  Lab 07/06/17 1028 07/07/17 0432 07/08/17 0256  WBC 12.1* 11.4* 9.7  HGB 7.3* 7.0* 6.3*  HCT 24.4* 23.7* 21.0*  PLT 109* 107* 107*    COAGULATION STUDIES Recent Labs  Lab 07/05/17 1036  07/06/17 2306 07/07/17 0432 07/08/17 0256  APTT  --    < > 65* 67* 99*  INR 1.37  --   --   --   --    < > = values in this interval not displayed.    CULTURES: Results for orders placed or performed during the hospital encounter of 07/05/17  MRSA PCR Screening     Status: None   Collection Time: 07/07/17  5:45 AM  Result Value Ref Range Status   MRSA by PCR NEGATIVE NEGATIVE Final    Comment:        The GeneXpert MRSA Assay (FDA approved for NASAL  specimens only), is one component of a comprehensive MRSA colonization surveillance program. It is not intended to diagnose MRSA infection nor to guide or monitor treatment for MRSA infections. Performed at [facility], [place] [address], [place], [place] [address]    *Note: Due to a large number of results and/or encounters for the requested time period, some results have not been displayed. A complete set of results can be found in Results Review.    IMAGING: No results found.  SIGNIFICANT EVENTS: 6/2: admitted with increased dyspnea and started on diuresis and BiPAP support for CHF exacerbation. 6/4: rapid response called for increased work of breathing. Remains BiPAP dependent. Transfer to ICU for closer monitoring. May require intubation. 07/07/2017 0800 hrs. tolerate noninvasive mechanical ventilatory support FiO2 is now down to 80% from 100 his pulmonary status remains tenuous.   ASSESSMENT / PLAN: Principal Problem:   Acute respiratory failure with hypoxia (HCC) Active Problems:   Essential hypertension   COPD GOLD III    CKD (chronic kidney disease) stage 3, GFR 30-59 ml/min (HCC)   Chronic anemia   Acute exacerbation of CHF (congestive heart failure) (HCC)   PULMONARY  ACUTE HYPOXEMIC RESPIRATORY FAILURE  ABNORMAL CHEST X-RAY/PULMONARY EDEMA  R PLEURAL EFFUSION  ARDS  Suspected Aspiration PNA  GOLD STAGE III COPD secondary to tobacco abuse history -07/07/2017 transferred to ICU -patient is looking in distress and desaturating on the highflow I will switch to  BiPAP - start empiric ABx since hypothermic and CXR not improving as expected  -Aggressive diuresis -Monitor renal function -May need intubation.  CARDIOVASCULAR  CHF EXACERBATION  VOLUME OVERLOAD  Intake/Output Summary (Last 24 hours) at 07/08/2017 0900 Last data filed at 07/08/2017 0800 Gross per 24 hour  Intake 404.5 ml  Output 1815 ml  Net -1410.5 ml   Filed Weights   07/06/17 0644  07/07/17 0322 07/08/17 0500  Weight: 98.8 kg (217 lb 12.8 oz) 94.6 kg (208 lb 8.9 oz) 90.9 kg (200 lb 6.4 oz)   -continue aggressive diuresis -Strict I&O -Daily weight -2D echo demonstrates grade 2 diastolic dysfunction  RENAL Lab Results  Component Value Date   CREATININE 2.37 (H) 07/08/2017   CREATININE 2.51 (H) 07/07/2017   CREATININE 2.43 (H) 07/06/2017   CREATININE 3.64 (H) 01/09/2016   CREATININE 2.33 (H) 09/25/2015   CREATININE 2.64 (H) 08/20/2015   CREATININE 2.7 (H) 08/16/2015   CREATININE 3.2 (HH) 07/05/2015   CREATININE 2.7 (H) 01/15/2015   Recent Labs  Lab 07/07/17 0432 07/07/17 2106 07/08/17 0256  K 4.7 4.2 4.3     CHRONIC KIDNEY DISEASE,  STAGE III  PARTIALLY COMPENSATED NON-ANION GAP METABOLIC ACIDOSIS -Continue to follow renal function -Nephrotoxins  HEMATOLOGIC Recent Labs    07/07/17 0432 07/08/17 0256  HGB 7.0* 6.3*     CHRONIC NORMOCYTIC ANEMIA/ANEMIA OF CHRONIC DISEASE  MONOCLONAL GAMMOPATHY OF UNKNOWN SIGNIFICANCE -On heparin drip history of Eliquis use for history of DVT. - I will transfuse with 1 PRBC - no signs of bleeding  -Transfusion per protocol -No overt bleeding but hemoglobin noted to be 7  INFECTIOUS  LEUKOCYTOSIS, unknown clinical significance -Panculture -due to hypothermia and bilateral infiltrates we will start zosyn and vanc   ENDOCRINE CBG (last 3)  Recent Labs    07/07/17 1547 07/07/17 2133 07/08/17 0728  GLUCAP 191* 164* 233*     TYPE 2 DIABETES MELLITUS with NEPHROPATHY, NEUROPATHY, VASCULAR DISEASE -ssi may need increase, not currently on steroids  DVT PROPHYLAXIS: Currently on heparin drip GI PROPHYLAXIS: Pepcid IV  NUTRITION: Currently n.p.o. while in acute noninvasive mechanical ventilatory support, if intubated will require tube feeding   FAMILY  - Updates: no family at bedside  - Inter-disciplinary family meet or Palliative Care meeting due by: 07/13/2017  I have spent 34 mins of CC  time bedside or in the unit exclusive of billable procedures.   Silver Huguenin  page [phone] 07/08/2017, 9:00 AM

## 2017-07-08 NOTE — Progress Notes (Signed)
CRITICAL VALUE ALERT  Critical Value:  0.04 troponin  Date & Time Notied:  07/07/2017 2237  Provider Notified: Warren Lacy MD  Orders Received/Actions taken: no new orders at this time.

## 2017-07-08 NOTE — Progress Notes (Signed)
This note also relates to the following rows which could not be included: SpO2 - Cannot attach notes to unvalidated device data  [name] increased to 10 per MD.

## 2017-07-08 NOTE — Procedures (Signed)
Central Venous Catheter Insertion Procedure Note JAYEN BROMWELL [id number] 07/08/1950  Procedure: Insertion of Central Venous Catheter Indications: Drug and/or fluid administration  Procedure Details Consent: Unable to obtain consent because of emergent medical necessity. Time Out: Verified patient identification, verified procedure, site/side was marked, verified correct patient position, special equipment/implants available, medications/allergies/relevent history reviewed, required imaging and test results available.  Performed  Maximum sterile technique was used including antiseptics, cap, gloves, gown, hand hygiene, mask and sheet. Skin prep: Chlorhexidine; local anesthetic administered A antimicrobial bonded/coated triple lumen catheter was placed in the right femoral vein   using the Seldinger technique. due to emergent situation no ultrasound and after an attempt in the left IJ which looked venous but was pulsating with coughing so I did not want to dilate not having a real time ultrasound. No hematomas.   Evaluation Blood flow good Complications: No apparent complications Patient did tolerate procedure well. Chest X-ray ordered to verify placement.    Wael Z Aljishi 07/08/2017, 10:27 AM

## 2017-07-08 NOTE — Progress Notes (Addendum)
OG advanced per Xray report.  No repeat abd Xray per Dr. Lucile Shutters.

## 2017-07-08 NOTE — Progress Notes (Signed)
Initial Nutrition Assessment  DOCUMENTATION CODES:   Not applicable  INTERVENTION:  Vital HP @ 10 ml/hr + 8 Prostat. Total regimen with propofol providing 1328 kcal, 141 g protein, and 202 ml H2O; meeting 106% kcal needs and 100% of protein needs.  RD to monitor renal labs and TF tolerance.   NUTRITION DIAGNOSIS:   Inadequate oral intake related to inability to eat as evidenced by NPO status.  GOAL:   Patient will meet greater than or equal to 90% of their needs  MONITOR:   Vent status, Skin, TF tolerance, Weight trends, Labs, I & O's  REASON FOR ASSESSMENT:   Ventilator    ASSESSMENT:   67 y.o. M admitted on 07/05/17 for acute respiratory failure with hypoxia and CHF exacerbation. PMH of HTN, PAD, CHF, CAD, hx of skin cancer, T2DM, hx of gangrene of toe 2014, CKD stg IV, and anemia. 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 Pt intubate 6/5.   Pt NPO since admission 6/2 Transferred to ICU 6/4 Intubated 6/5  Per MD H&P note, pt was getting ready for dialysis.  Per MD note, if pt intubated, start TF. Per MD note aggressively trying to diurese and monitoring renal function. Spoke with MD, ok to order TF.  Per pharmacist note dosing weight 89.5 kg.   Pt asleep on vent with family at bedside. Family reports that [facility] at [facility] home" [facility] months. Family reports pt has a very good appetite and will eat a lot, but is unsure exactly how much and what is eaten at the facility. Family reports no known weight loss; unable to give UBW as family reports his weight fluctuates due to fluid; EDW unknown.   Medications reviewed: coreg, catapres, ferrous sulfate, novolog 0-9 units, lantus, levothyroxine, pepcid, fentanyl, lasix, heparin, zosyn, propofol, vancocin.  Propofol @ 10.9 ml/hr = 288 kcal  MVe: 10.2  Temp (24hrs), Avg:96.1 F (35.6 C), Min:93.7 F (34.3 C), Max:98.1 F (36.7 C)  Labs reviewed: Na 151 (H), Cl 120 (H), CO2 21 (L), BG 190 (H), BUN 95 (H), creatinine 2.37 (H), phosphorus 4.9 (H),  magnesium 2.5 (H), GFR 27 (L), RBC 2.41 (L), hemoglobin 6.3 (L), HCT 21 (L), platelets 107 (L).   Net I/O since admit: -7.4 L  NUTRITION - FOCUSED PHYSICAL EXAM:    Most Recent Value  Orbital Region  No depletion  Upper Arm Region  No depletion  Thoracic and Lumbar Region  No depletion  Buccal Region  No depletion  Temple Region  No depletion  Clavicle Bone Region  No depletion  Clavicle and Acromion Bone Region  No depletion  Scapular Bone Region  Unable to assess  Dorsal Hand  No depletion  Patellar Region  No depletion  Anterior Thigh Region  No depletion  Posterior Calf Region  No depletion [R Leg (L leg with AKA)]  Edema (RD Assessment)  Mild [pitting on LE]  Hair  Reviewed  Skin  Reviewed  Nails  Reviewed     Diet Order:   Diet Order           Diet NPO time specified Except for: Sips with Meds  Diet effective now          EDUCATION NEEDS:   Not appropriate for education at this time  Skin:  Skin Assessment: Reviewed RN Assessment  Last BM:  07/05/17  Height:   Ht Readings from Last 1 Encounters:  07/05/17 5\' 8"  (1.727 m)    Weight:   Wt Readings from Last 1  Encounters:  07/08/17 200 lb 6.4 oz (90.9 kg)    Ideal Body Weight:  64.4 kg  BMI:  Body mass index is 30.47 kg/m.  Estimated Nutritional Needs:   Kcal:  [phone] kcal  Protein:  130-145 grams  Fluid:  1 L + UOP or per MD   Hope Budds, Dietetic Intern

## 2017-07-08 NOTE — Progress Notes (Signed)
Patient on HFNC 60L, 100 FIO2.  Patient sats 86-88, patient more lethargic.  Elink MD paged, verbal to increase to 70L.  Goal to keep 02 sats 90 and above.  No other interventions stated.

## 2017-07-08 NOTE — Progress Notes (Signed)
FPTS Social Note  Mr. Belue was placed back on Bipap this morning after being on high flow overnight.  He continues to require ICU level care.  We will continue to follow and resume care after being transferred back to the floor.  We appreciate the excellent care provided by CCM.

## 2017-07-08 NOTE — Procedures (Signed)
Intubation Procedure Note Leonard [name] [id number] [birth year]  Procedure: Intubation Indications: Respiratory insufficiency  Procedure Details Consent: Unable to obtain consent because of emergent medical necessity. Time Out: Verified patient identification, verified procedure, site/side was marked, verified correct patient position, special equipment/implants available, medications/allergies/relevent history reviewed, required imaging and test results available.  Performed  Maximum sterile technique was used including gloves and mask. MAC 3used and size 8 ETT from first attempt     Evaluation Hemodynamic Status: BP stable throughout; O2 sats: stable throughout Patient's Current Condition: stable Complications: No apparent complications Patient did tolerate procedure well. Chest X-ray ordered to verify placement.  CXR: pending.   Estill Batten Z Taela Charbonneau 07/08/2017

## 2017-07-08 NOTE — Telephone Encounter (Signed)
Patient previously enrolled in [facility].  He is currently admitted as inpatient however was in a SNF prior to that time.  He will be discharged from the [facility] program at this time secondary to SNF placement.   He could resume Paramedic home visits after discharge if needed.

## 2017-07-08 NOTE — Plan of Care (Signed)
RN educated pt's sister about POC

## 2017-07-09 ENCOUNTER — Inpatient Hospital Stay (HOSPITAL_COMMUNITY): Payer: Medicare Other

## 2017-07-09 DIAGNOSIS — J80 Acute respiratory distress syndrome: Secondary | ICD-10-CM

## 2017-07-09 DIAGNOSIS — E870 Hyperosmolality and hypernatremia: Secondary | ICD-10-CM

## 2017-07-09 LAB — HEPARIN LEVEL (UNFRACTIONATED): Heparin Unfractionated: 0.82 IU/mL — ABNORMAL HIGH (ref 0.30–0.70)

## 2017-07-09 LAB — CBC WITH DIFFERENTIAL/PLATELET
Abs Immature Granulocytes: 0 10*3/uL (ref 0.0–0.1)
BASOS ABS: 0 10*3/uL (ref 0.0–0.1)
Basophils Relative: 0 %
EOS PCT: 2 %
Eosinophils Absolute: 0.1 10*3/uL (ref 0.0–0.7)
HCT: 21.1 % — ABNORMAL LOW (ref 39.0–52.0)
HEMOGLOBIN: 6.3 g/dL — AB (ref 13.0–17.0)
IMMATURE GRANULOCYTES: 1 %
Lymphocytes Relative: 9 %
Lymphs Abs: 0.5 10*3/uL — ABNORMAL LOW (ref 0.7–4.0)
MCH: 26.7 pg (ref 26.0–34.0)
MCHC: 29.9 g/dL — AB (ref 30.0–36.0)
MCV: 89.4 fL (ref 78.0–100.0)
Monocytes Absolute: 0.3 10*3/uL (ref 0.1–1.0)
Monocytes Relative: 5 %
NEUTROS PCT: 83 %
Neutro Abs: 4.5 10*3/uL (ref 1.7–7.7)
Platelets: 84 10*3/uL — ABNORMAL LOW (ref 150–400)
RBC: 2.36 MIL/uL — AB (ref 4.22–5.81)
RDW: 19.2 % — AB (ref 11.5–15.5)
WBC: 5.4 10*3/uL (ref 4.0–10.5)

## 2017-07-09 LAB — GLUCOSE, CAPILLARY
GLUCOSE-CAPILLARY: 189 mg/dL — AB (ref 65–99)
Glucose-Capillary: 188 mg/dL — ABNORMAL HIGH (ref 65–99)
Glucose-Capillary: 154 mg/dL — ABNORMAL HIGH (ref 65–99)
Glucose-Capillary: 177 mg/dL — ABNORMAL HIGH (ref 65–99)
Glucose-Capillary: 157 mg/dL — ABNORMAL HIGH (ref 65–99)
Glucose-Capillary: 134 mg/dL — ABNORMAL HIGH (ref 65–99)

## 2017-07-09 LAB — BRAIN NATRIURETIC PEPTIDE: B Natriuretic Peptide: 175.3 pg/mL — ABNORMAL HIGH (ref 0.0–100.0)

## 2017-07-09 LAB — BASIC METABOLIC PANEL
ANION GAP: 9 (ref 5–15)
BUN: 93 mg/dL — ABNORMAL HIGH (ref 6–20)
CALCIUM: 8.1 mg/dL — AB (ref 8.9–10.3)
CHLORIDE: 119 mmol/L — AB (ref 101–111)
CO2: 25 mmol/L (ref 22–32)
Creatinine, Ser: 2.59 mg/dL — ABNORMAL HIGH (ref 0.61–1.24)
GFR calc Af Amer: 28 mL/min — ABNORMAL LOW (ref >60)
GFR calc non Af Amer: 24 mL/min — ABNORMAL LOW (ref >60)
Glucose, Bld: 189 mg/dL — ABNORMAL HIGH (ref 65–99)
POTASSIUM: 3.9 mmol/L (ref 3.5–5.1)
Sodium: 153 mmol/L — ABNORMAL HIGH (ref 135–145)

## 2017-07-09 LAB — PREPARE RBC (CROSSMATCH)

## 2017-07-09 LAB — MAGNESIUM
MAGNESIUM: 2.4 mg/dL (ref 1.7–2.4)
Magnesium: 2.5 mg/dL — ABNORMAL HIGH (ref 1.7–2.4)

## 2017-07-09 LAB — APTT: APTT: 85 s — AB (ref 24–36)

## 2017-07-09 LAB — PHOSPHORUS: PHOSPHORUS: 4.4 mg/dL (ref 2.5–4.6)

## 2017-07-09 MED ORDER — IPRATROPIUM-ALBUTEROL 0.5-2.5 (3) MG/3ML IN SOLN
3.0000 mL | RESPIRATORY_TRACT | Status: DC
  Administered 2017-07-09 – 2017-07-17 (×51): 3 mL via RESPIRATORY_TRACT
  Filled 2017-07-09 (×50): qty 3

## 2017-07-09 MED ORDER — SODIUM CHLORIDE 0.9% FLUSH: 10.0000 mL | INTRAVENOUS | Status: DC | PRN

## 2017-07-09 MED ORDER — PRO-STAT SUGAR FREE PO LIQD
30.0000 mL | Freq: Every day | ORAL | Status: DC
  Administered 2017-07-09: 30 mL
  Filled 2017-07-09 (×2): qty 30

## 2017-07-09 MED ORDER — FENTANYL 2500MCG IN NS 250ML (10MCG/ML) PREMIX INFUSION
100.0000 ug/h | INTRAVENOUS | Status: DC
  Administered 2017-07-09 – 2017-07-11 (×4): 200 ug/h via INTRAVENOUS
  Administered 2017-07-12: 100 ug/h via INTRAVENOUS
  Filled 2017-07-09 (×6): qty 250

## 2017-07-09 MED ORDER — FENTANYL CITRATE (PF) 100 MCG/2ML IJ SOLN: 100.0000 ug | Freq: Once | INTRAMUSCULAR | Status: DC | PRN

## 2017-07-09 MED ORDER — SODIUM CHLORIDE 0.9 % IV SOLN
3.0000 ug/kg/min | INTRAVENOUS | Status: DC
  Administered 2017-07-09: 3 ug/kg/min via INTRAVENOUS
  Administered 2017-07-09 – 2017-07-10 (×2): 4 ug/kg/min via INTRAVENOUS
  Administered 2017-07-10 – 2017-07-11 (×2): 3 ug/kg/min via INTRAVENOUS
  Filled 2017-07-09 (×4): qty 20

## 2017-07-09 MED ORDER — PROPOFOL 1000 MG/100ML IV EMUL
25.0000 ug/kg/min | INTRAVENOUS | Status: DC
  Administered 2017-07-09: 50 ug/kg/min via INTRAVENOUS
  Administered 2017-07-09: 40 ug/kg/min via INTRAVENOUS
  Administered 2017-07-10 (×6): 50 ug/kg/min via INTRAVENOUS
  Administered 2017-07-11: 45 ug/kg/min via INTRAVENOUS
  Administered 2017-07-11: 50 ug/kg/min via INTRAVENOUS
  Administered 2017-07-11: 35 ug/kg/min via INTRAVENOUS
  Administered 2017-07-11: 50 ug/kg/min via INTRAVENOUS
  Administered 2017-07-12: 25 ug/kg/min via INTRAVENOUS
  Administered 2017-07-12: 10 ug/kg/min via INTRAVENOUS
  Filled 2017-07-09 (×17): qty 100

## 2017-07-09 MED ORDER — CISATRACURIUM BOLUS VIA INFUSION
2.5000 mg | Freq: Once | INTRAVENOUS | Status: AC
  Administered 2017-07-09: 2.5 mg via INTRAVENOUS
  Filled 2017-07-09: qty 3

## 2017-07-09 MED ORDER — FREE WATER
200.0000 mL | Status: DC
  Administered 2017-07-09 – 2017-07-10 (×7): 200 mL

## 2017-07-09 MED ORDER — ARTIFICIAL TEARS OPHTHALMIC OINT
1.0000 "application " | TOPICAL_OINTMENT | Freq: Three times a day (TID) | OPHTHALMIC | Status: DC
  Administered 2017-07-09 – 2017-07-12 (×8): 1 via OPHTHALMIC
  Filled 2017-07-09: qty 3.5

## 2017-07-09 MED ORDER — LEVOTHYROXINE SODIUM 100 MCG IV SOLR
50.0000 ug | Freq: Every day | INTRAVENOUS | Status: DC
  Administered 2017-07-10 – 2017-07-18 (×9): 50 ug via INTRAVENOUS
  Filled 2017-07-09 (×9): qty 5

## 2017-07-09 MED ORDER — SODIUM CHLORIDE 0.9 % IV SOLN
0.5000 ug/kg/min | INTRAVENOUS | Status: DC
Start: 2017-07-09 — End: 2017-07-09

## 2017-07-09 MED ORDER — INSULIN ASPART 100 UNIT/ML ~~LOC~~ SOLN
0.0000 [IU] | SUBCUTANEOUS | Status: DC
  Administered 2017-07-09 (×2): 2 [IU] via SUBCUTANEOUS
  Administered 2017-07-09 – 2017-07-10 (×2): 1 [IU] via SUBCUTANEOUS
  Administered 2017-07-11 (×2): 2 [IU] via SUBCUTANEOUS
  Administered 2017-07-11: 1 [IU] via SUBCUTANEOUS
  Administered 2017-07-12 (×4): 3 [IU] via SUBCUTANEOUS

## 2017-07-09 MED ORDER — FENTANYL CITRATE (PF) 100 MCG/2ML IJ SOLN
100.0000 ug | Freq: Once | INTRAMUSCULAR | Status: AC
  Administered 2017-07-09: 100 ug via INTRAVENOUS

## 2017-07-09 MED ORDER — SODIUM CHLORIDE 0.9% FLUSH
10.0000 mL | Freq: Two times a day (BID) | INTRAVENOUS | Status: DC
  Administered 2017-07-10 (×2): 10 mL
  Administered 2017-07-11 – 2017-07-12 (×2): 30 mL
  Administered 2017-07-12 – 2017-07-14 (×4): 10 mL
  Administered 2017-07-14: 30 mL

## 2017-07-09 MED ORDER — FENTANYL BOLUS VIA INFUSION
50.0000 ug | INTRAVENOUS | Status: DC | PRN
  Filled 2017-07-09: qty 50

## 2017-07-09 MED ORDER — VITAL HIGH PROTEIN PO LIQD: 1000.0000 mL | ORAL | Status: DC

## 2017-07-09 MED ORDER — CHLORHEXIDINE GLUCONATE CLOTH 2 % EX PADS
6.0000 | MEDICATED_PAD | Freq: Every day | CUTANEOUS | Status: DC
  Administered 2017-07-09 – 2017-07-15 (×7): 6 via TOPICAL

## 2017-07-09 MED ORDER — IPRATROPIUM-ALBUTEROL 0.5-2.5 (3) MG/3ML IN SOLN
RESPIRATORY_TRACT | Status: AC
  Filled 2017-07-09: qty 3

## 2017-07-09 MED ORDER — SODIUM CHLORIDE 0.9 % IV SOLN: Freq: Once | INTRAVENOUS | Status: DC

## 2017-07-09 NOTE — Progress Notes (Addendum)
ANTICOAGULATION CONSULT NOTE   Pharmacy Consult for Heparin Indication: h/o chronic DVT  No Known Allergies  Patient Measurements: Height: 5' 8" (172.7 cm) Weight: 191 lb 12.8 oz (87 kg) IBW/kg (Calculated) : 68.4 Heparin Dosing Weight: 89.5 kg  Vital Signs: Temp: 98.2 F (36.8 C) (06/06 0843) Temp Source: Rectal (06/06 0843) BP: 141/53 (06/06 0843) Pulse Rate: 66 (06/06 0732)  Labs: Recent Labs    07/07/17 0432  07/08/17 0256 07/08/17 1112 07/08/17 1722 07/09/17 0210  HGB 7.0*  --  6.3*  --   --  6.3*  HCT 23.7*  --  21.0*  --   --  21.1*  PLT 107*  --  107*  --   --  84*  APTT 67*  --  99*  --   --  85*  HEPARINUNFRC 1.02*  --  1.12*  --   --  0.82*  CREATININE 2.51*  --  2.37*  --   --  2.59*  TROPONINI  --    < > 0.04* 0.03* 0.03*  --    < > = values in this interval not displayed.    Estimated Creatinine Clearance: 30.1 mL/min (A) (by C-G formula based on SCr of 2.59 mg/dL (H)).   Medical History: Past Medical History:  Diagnosis Date  . Cancer ([place])    skin cancer-  . CHF (congestive heart failure) ([place])   . Chronic kidney disease    not on dialysis  . Coronary artery disease   . Gangrene of toe ([place])    right 5th/notes 01/04/2013   . High cholesterol   . Hypertension   . Iron deficiency anemia 08/03/2015  . 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 PAD (peripheral artery disease) ([place])   . Shortness of breath dyspnea    with exertion  . Type II diabetes mellitus (H[facility])    Type II    Medications:  Medications Prior to Admission  Medication Sig Dispense Refill Last Dose  . allopurinol (ZYLOPRIM) 100 MG tablet Take 100 mg by mouth daily.   07/04/2017 at Unknown time  . amLODipine (NORVASC) 10 MG tablet Take 1 tablet (10 mg total) by mouth daily. Needs office visit for further refills 30 tablet 0 07/04/2017 at Unknown time  . atorvastatin (LIPITOR) 40 MG tablet TAKE ONE (1) TABLET BY MOUTH EVERY DAY AT 6:00PM (Patient taking  differently: Take 40 mg by mouth once a day at 6 PM) 90 tablet 3 07/04/2017 at Unknown time  . carvedilol (COREG) 25 MG tablet TAKE TWO (2) TABLETS BY MOUTH 2 TIMES DAILY WITH A MEAL (Patient taking differently: TAKE TWO TABLETS (66m) BY MOUTH 2 TIMES DAILY WITH A MEAL) 120 tablet 3 07/04/2017 at 1800  . cloNIDine (CATAPRES) 0.2 MG tablet TAKE TWO (2) TABLETS BY MOUTH 2 TIMES DAILY 120 tablet 3 07/04/2017 at 1800  . doxazosin (CARDURA) 8 MG tablet Take 1 tablet (8 mg total) by mouth every evening. (Patient taking differently: Take 8 mg by mouth at bedtime. )   07/04/2017 at Unknown time  . ELIQUIS 2.5 MG TABS tablet Take 2.5 mg by mouth 2 (two) times daily.   07/04/2017 at 0800  . FEROSUL 325 (65 Fe) MG tablet TAKE ONE (1) TABLET BY MOUTH TWO (2) TIMES DAILY WITH A MEAL (Patient taking differently: TAKE 325 MG BY MOUTH TWO (2) TIMES DAILY WITH A MEAL) 60 tablet 3 07/04/2017 at Unknown time  . hydrALAZINE (APRESOLINE) 100 MG tablet TAKE ONE TABLET BY MOUTH EVERY  EIGHT HOURS (Patient taking differently: No sig reported) 90 tablet 1 07/04/2017 at 1400  . insulin aspart (NOVOLOG FLEXPEN) 100 UNIT/ML FlexPen Inject 15 Units 2 (two) times daily before lunch and supper into the skin. 15 mL 5 07/04/2017 at 1800  . ipratropium-albuterol (DUONEB) 0.5-2.5 (3) MG/3ML SOLN Take 3 mLs by nebulization 4 (four) times daily.   07/04/2017 at 1700  . isosorbide mononitrate (IMDUR) 60 MG 24 hr tablet TAKE ONE (1) TABLET BY MOUTH EVERY DAY (Patient taking differently: TAKE ONE TABLET (82m) BY MOUTH EVERY DAY) 30 tablet 3 07/04/2017 at Unknown time  . levothyroxine (SYNTHROID, LEVOTHROID) 100 MCG tablet Take 100 mcg by mouth daily before breakfast.   07/04/2017 at Unknown time  . nitroGLYCERIN (NITROSTAT) 0.4 MG SL tablet Place 1 tablet (0.4 mg total) under the tongue every 5 (five) minutes as needed for chest pain. 30 tablet 12 unk at prn  . pantoprazole (PROTONIX) 40 MG tablet TAKE ONE (1) TABLET BY MOUTH EVERY DAY (Patient taking differently:  TAKE ONE TABLET (422m BY MOUTH EVERY DAY) 90 tablet 0 07/04/2017 at Unknown time  . potassium chloride SA (K-DUR,KLOR-CON) 20 MEQ tablet Take 20 mEq by mouth 2 (two) times daily.    07/04/2017 at Unknown time  . predniSONE (DELTASONE) 20 MG tablet Take 40 mg by mouth daily with breakfast.   07/04/2017 at Unknown time  . torsemide (DEMADEX) 20 MG tablet Take 20 mg by mouth 2 (two) times daily.   07/04/2017 at Unknown time  . Blood Glucose Monitoring Suppl (ONETOUCH VERIO FLEX SYSTEM) w/Device KIT 1 kit by Does not apply route daily. 1 kit 3 Taking  . glucose blood (ONETOUCH VERIO) test strip Use as instructed to test three times daily. ICD-10 code: E11.22 100 each 12 Taking  . ONETOUCH DELICA LANCETS 3370YISC 1 each by Does not apply route 3 (three) times daily. ICD-10 code: E11.22 100 each 12 Taking  . oxyCODONE (OXY IR/ROXICODONE) 5 MG immediate release tablet Take 1 tablet (5 mg total) by mouth every 6 (six) hours as needed for moderate pain. (Patient not taking: Reported on 07/05/2017) 30 tablet 0 Not Taking at Unknown time  . traMADol (ULTRAM) 50 MG tablet Take 1 tablet (50 mg total) by mouth every 6 (six) hours as needed for moderate pain. (Patient not taking: Reported on 07/05/2017) 60 tablet 0 Not Taking at Unknown time    Assessment: [age].o male with h/o chronic DVT on apixaban PTA, last taken pta on 07/04/17 at 0800 per SNF medication records.  Pharmacy consulted to change to IV heparin in setting of respiratory distress with VDRF.   -hg= 6.3 (trend down; admit was 7.8), plt= 84 -aPTT= 95  Goal of Therapy:  APTT goal 66-85 Monitor platelets by anticoagulation protocol: Yes   Plan:  -Change heparin rate to 1050 units/hr to keep on the low end of goal -Will adjust aPTT goal to 66-85 due to low hg/plt -Daily heparin level, aPTT and CBC  AnHildred LaserPharmD Clinical Pharmacist Clinical phone from 8:30-4:00 is [phone] 4pm, please call Main [facility] [phone]) for assistance. 07/09/2017 9:36  AM

## 2017-07-09 NOTE — Progress Notes (Deleted)
Patient Precedex and Esmolol gtt increased patients HR dropped to 40s.  Bridgett Larsson MD paged. Verbal order for HR to be 50s if patient not symptomatic but ideally HR in 60s.

## 2017-07-09 NOTE — Progress Notes (Signed)
RT called to room by RN as patient keeps desating into the low 80's. RT suctioned patient and got moderate amount of bloody secretions. Sats continued to stay in the 80's. RT increased FIO2 to 100% and sats are now 92%. RT will notify RN and contact MD of change and increase in FIO2. Vitals are stable. RT will continue to monitor.

## 2017-07-09 NOTE — Progress Notes (Signed)
FPTS Social Note  Examined Leonard [name] and spoke with this sister at bedside.  We will continue to follow and will resume care after he is transferred back to the floor.  We appreciate the care given by CCM.

## 2017-07-09 NOTE — Progress Notes (Signed)
[facility]  [name] MRN: [id number] DOB: January 18, 1951    ADMISSION DATE:  07/05/2017 CONSULTATION DATE:  07/07/2017  REFERRING MD:  Guadalupe Dawn, MD  REASON FOR CONSULTATION:  Respiratory distress  Interval Hx: Patient was intubated yesterday due to worsening hypoxemia and respiratory distress despite BIPAP. He was transfused with 1 U PRBC yesterday before intubation and CVC insertion and today he is dropping his Hb again. No signs of bleeding. Sedated and not on any pressors. On 60% FIO2 on the ventilator.      HISTORY OF PRESENT ILLNESS  This 67 y.o. African American male is seen in consultation at the request of Dr. Guadalupe Dawn for recommendations on further evaluation and management of respiratory distress. The patient was admitted on 07/05/2017 with a clinical impression of CHF exacerbation. He has been BiPAP dependent for about 36 hours or so. Rapid response team was called earlier this evening for increased work of breathing with use of accessory muscles, tachypnea (40s) and increasing FiO2 requirements. The patient was treated with adjustment of BiPAP settings and an additional furosemide dose (40 mg). At the time of clinical interview, the patient remains on BiPAP 12/8, FiO2 50%. RR is 28-30.  SPO2 90-91%.  He is awake, alert and follows commands. He is desperately asking for water; however, the patient precipitously desaturates with removal of the BiPAP mask.  Aside from being thirsty, the patient is tolerating BiPAP therapy quite well.  The service was consulted for consideration of endotracheal intubation and/or transfer to ICU.     VITAL SIGNS: BP (!) 131/53   Pulse 66   Temp 98.4 F (36.9 C)   Resp 19   Ht 5\' 8"  (1.727 m)   Wt 87 kg (191 lb 12.8 oz)   SpO2 100%   BMI 29.16 kg/m   VENTILATOR SETTINGS: Vent Mode: PRVC FiO2 (%):  [60 %-100 %] 60 % Set Rate:  [14 bmp] 14 bmp Vt Set:  [550 mL] 550 mL [name]:  [5 cmH20-10 cmH20]  10 cmH20 Plateau Pressure:  [21 cmH20-27 cmH20] 25 cmH20  INTAKE / OUTPUT:  Intake/Output Summary (Last 24 hours) at 07/09/2017 0816 Last data filed at 07/09/2017 0655 Gross per 24 hour  Intake 1594.06 ml  Output 2295 ml  Net -700.94 ml   PHYSICAL EXAMINATION: General: Ill-appearing intubated sedated   HEENT: ETT in place  Neuro: sedated not following commands  CV: Currently in sinus rhythm PULM: Coarse rhonchi upper lobes, crackles bilaterally  RW:ERXV, non-tender, bsx4 active  Extremities: cold/dry, 1+ right lower extremity edema, left AKA with well-healed scar Skin: no rashes or lesions   LABS:  ABG Recent Labs  Lab 07/07/17 2001 07/07/17 2229 07/08/17 1121  PHART 7.453* 7.411 7.304*  PCO2ART 31.3* 33.5 42.7  PO2ART 50.0* 53.0* 110.0*    Cardiac Enzymes Lab Results  Component Value Date   TROPIPOC 0.02 07/05/2017   Lab Results  Component Value Date   BNP 175.3 (H) 07/09/2017   BNP 464.2 (H) 07/07/2017   BNP 595.9 (H) 40/09/6759    BASIC METABOLIC PROFILE Recent Labs  Lab 07/07/17 0432 07/07/17 2106 07/08/17 0256 07/08/17 1722 07/09/17 0210  NA 146*  --  151*  --  153*  K 4.7 4.2 4.3 4.0 3.9  CL 115*  --  120*  --  119*  CO2 16*  --  21*  --  25  BUN 91*  --  95*  --  93*  CREATININE 2.51*  --  2.37*  --  2.59*  GLUCOSE 342*  --  190*  --  189*  CALCIUM 8.5*  --  8.4*  --  8.1*  MG  --  2.4 2.5*  --  2.4  PHOS  --   --  4.9*  --   --     Glucose Recent Labs  Lab 07/07/17 1547 07/07/17 2133 07/08/17 0728 07/08/17 1147 07/08/17 2120 07/09/17 0745  GLUCAP 191* 164* 233* 189* 150* 154*    CBC Recent Labs  Lab 07/07/17 0432 07/08/17 0256 07/09/17 0210  WBC 11.4* 9.7 5.4  HGB 7.0* 6.3* 6.3*  HCT 23.7* 21.0* 21.1*  PLT 107* 107* 84*    COAGULATION STUDIES Recent Labs  Lab 07/05/17 1036  07/07/17 0432 07/08/17 0256 07/09/17 0210  APTT  --    < > 67* 99* 85*  INR 1.37  --   --   --   --    < > = values in this interval not  displayed.    CULTURES: Results for orders placed or performed during the hospital encounter of 07/05/17  MRSA PCR Screening     Status: None   Collection Time: 07/07/17  5:45 AM  Result Value Ref Range Status   MRSA by PCR NEGATIVE NEGATIVE Final    Comment:        The GeneXpert MRSA Assay (FDA approved for NASAL specimens only), is one component of a comprehensive MRSA colonization surveillance program. It is not intended to diagnose MRSA infection nor to guide or monitor treatment for MRSA infections. Performed at [facility], [place] [address], [place], [place] [address]   Culture, blood (routine x 2)     Status: None (Preliminary result)   Collection Time: 07/07/17  7:57 AM  Result Value Ref Range Status   Specimen Description BLOOD RIGHT HAND PINKY SIDE  Final   Special Requests   Final    BOTTLES DRAWN AEROBIC ONLY Blood Culture results may not be optimal due to an inadequate volume of blood received in culture bottles   Culture   Final    NO GROWTH 1 DAY Performed at [facility], [place] [address], [place], [place] [address]    Report Status PENDING  Incomplete  Culture, blood (routine x 2)     Status: None (Preliminary result)   Collection Time: 07/07/17  8:05 AM  Result Value Ref Range Status   Specimen Description BLOOD RIGHT HAND THUMB SIDE  Final   Special Requests   Final    BOTTLES DRAWN AEROBIC ONLY Blood Culture results may not be optimal due to an inadequate volume of blood received in culture bottles   Culture   Final    NO GROWTH 1 DAY Performed at [facility], [place] [address] [place], [place] [address]    Report Status PENDING  Incomplete  Culture, respiratory (NON-Expectorated)     Status: None (Preliminary result)   Collection Time: 07/08/17 11:12 AM  Result Value Ref Range Status   Specimen Description TRACHEAL ASPIRATE  Final   Special Requests NONE  Final   Gram Stain   Final    FEW WBC PRESENT, PREDOMINANTLY PMN NO ORGANISMS  SEEN Performed at [facility], [place] [address] [place],  [address]    Culture PENDING  Incomplete   Report Status PENDING  Incomplete   *Note: Due to a large number of results and/or encounters for the requested time period, some results have not been displayed. A complete set  of results can be found in Results Review.    IMAGING: Dg Chest 1 View  Result Date: 07/08/2017 CLINICAL DATA:  Assess positioning of the orogastric tube. EXAM: CHEST  1 VIEW COMPARISON:  Portable chest x-ray of earlier today FINDINGS: The orogastric tubes proximal port lies at or just below the GE junction with the tip in the gastric cardia. The endotracheal tube lies approximately 4 cm above the carina. There is confluent airspace opacity in the left lung and to a lesser extent on the right. IMPRESSION: Somewhat high positioning of the orogastric tube. Advancement by 5-10 cm would assure that the proximal port is positioned below the GE junction. Electronically Signed   By: David  Martinique M.D.   On: 07/08/2017 11:21   Dg Abd Portable 1v  Result Date: 07/08/2017 CLINICAL DATA:  OG tube placement. EXAM: PORTABLE ABDOMEN - 1 VIEW COMPARISON:  None. FINDINGS: The tip of the enteric tube is below the diaphragm in the stomach, side-port just beyond the gastroesophageal junction. Air-filled bowel in the central abdomen without obstruction. IMPRESSION: Tip of the enteric tube below the diaphragm, side-port just beyond the gastroesophageal junction. Recommend advancement of at least 3 cm for optimal placement. Electronically Signed   By: Jeb Levering M.D.   On: 07/08/2017 22:24    SIGNIFICANT EVENTS: 6/2: admitted with increased dyspnea and started on diuresis and BiPAP support for CHF exacerbation. 6/4: rapid response called for increased work of breathing. Remains BiPAP dependent. Transfer to ICU for closer monitoring. May require intubation. 07/07/2017 0800 hrs. tolerate noninvasive mechanical ventilatory support  FiO2 is now down to 80% from 100 his pulmonary status remains tenuous.   ASSESSMENT / PLAN: Principal Problem:   Acute respiratory failure with hypoxia (HCC) Active Problems:   Essential hypertension   COPD GOLD III    CKD (chronic kidney disease) stage 3, GFR 30-59 ml/min (HCC)   Chronic anemia   Acute exacerbation of CHF (congestive heart failure) (HCC)   PULMONARY  ACUTE HYPOXEMIC RESPIRATORY FAILURE  ABNORMAL CHEST X-RAY/PULMONARY EDEMA  R PLEURAL EFFUSION  ARDS  Suspected Aspiration PNA  GOLD STAGE III COPD secondary to tobacco abuse history -07/07/2017 transferred to ICU - adjust ventilator settings and try to wean down FIO2. Now on 60% fio2 and [name] 10  - continue  empiric ABx since hypothermic and CXR was not  improving as expected  -Aggressive diuresis -Monitor renal function   CARDIOVASCULAR  CHF EXACERBATION  VOLUME OVERLOAD  Intake/Output Summary (Last 24 hours) at 07/09/2017 0816 Last data filed at 07/09/2017 0655 Gross per 24 hour  Intake 1594.06 ml  Output 2295 ml  Net -700.94 ml   Filed Weights   07/07/17 0322 07/08/17 0500 07/09/17 0500  Weight: 94.6 kg (208 lb 8.9 oz) 90.9 kg (200 lb 6.4 oz) 87 kg (191 lb 12.8 oz)   -continue aggressive diuresis -Strict I&O -Daily weight -2D echo demonstrates grade 2 diastolic dysfunction  RENAL Lab Results  Component Value Date   CREATININE 2.59 (H) 07/09/2017   CREATININE 2.37 (H) 07/08/2017   CREATININE 2.51 (H) 07/07/2017   CREATININE 3.64 (H) 01/09/2016   CREATININE 2.33 (H) 09/25/2015   CREATININE 2.64 (H) 08/20/2015   CREATININE 2.7 (H) 08/16/2015   CREATININE 3.2 (HH) 07/05/2015   CREATININE 2.7 (H) 01/15/2015   Recent Labs  Lab 07/08/17 0256 07/08/17 1722 07/09/17 0210  K 4.3 4.0 3.9     CHRONIC KIDNEY DISEASE, STAGE III  PARTIALLY COMPENSATED NON-ANION GAP METABOLIC ACIDOSIS  Hypernatremia    -  Continue to follow renal function -avoid Nephrotoxins - start freee water 228ml q  4hr  HEMATOLOGIC Recent Labs    07/08/17 0256 07/09/17 0210  HGB 6.3* 6.3*     CHRONIC NORMOCYTIC ANEMIA/ANEMIA OF CHRONIC DISEASE  MONOCLONAL GAMMOPATHY OF UNKNOWN SIGNIFICANCE -On heparin drip history of Eliquis use for history of DVT. - I will transfuse with another  1 PRBC (he was also transfused yesterday) - no signs of bleeding  - will need to be investigated further if pateint continues to drop his Hb  -Transfusion per protocol   GI - I will consult dietician to start tube feeds    INFECTIOUS  LEUKOCYTOSIS, unknown clinical significance -follow Panculture NEGATIVE so far  -due to hypothermia and bilateral infiltrates he was started on zosyn and vanc   ENDOCRINE CBG (last 3)  Recent Labs    07/08/17 1147 07/08/17 2120 07/09/17 0745  GLUCAP 189* 150* 154*     TYPE 2 DIABETES MELLITUS with NEPHROPATHY, NEUROPATHY, VASCULAR DISEASE -ssi may need increase, not currently on steroids  DVT PROPHYLAXIS: Currently on heparin drip GI PROPHYLAXIS: Pepcid IV  NUTRITION: Currently n.p.o. while in acute noninvasive mechanical ventilatory support, if intubated will require tube feeding   FAMILY  - Updates: no family at bedside  - Inter-disciplinary family meet or Palliative Care meeting due by: 07/13/2017  I have spent 36 mins of CC time bedside or in the unit exclusive of billable procedures.   Silver Huguenin  page [phone] 07/09/2017, 8:16 AM

## 2017-07-09 NOTE — Progress Notes (Signed)
75 MD called pt desaturating, resp at bedside, bagging pt and decompressing. Pt with high peak pressures, sedation/pain increased. WCTM

## 2017-07-09 NOTE — Progress Notes (Signed)
Titrating down based on sats

## 2017-07-09 NOTE — Progress Notes (Signed)
95 RN to bedside, pt keeps desaturating, on 60% FiO2, now on 100% O2 sats in low 90s. MD called, nimbex ordered. WCTM.

## 2017-07-09 NOTE — Progress Notes (Signed)
eLink Physician-Brief Progress Note [name] DOB: September 11, 1950 MRN: [id number]   Date of Service  07/09/2017  HPI/Events of Note  Acute blood loss anemia  eICU Interventions  Transfuse one unit PRBC        Okoronkwo U Ogan 07/09/2017, 6:31 AM

## 2017-07-09 NOTE — Progress Notes (Signed)
Elink aware of Hgb 6.3

## 2017-07-09 NOTE — Progress Notes (Addendum)
Nutrition Follow-up  DOCUMENTATION CODES:   Not applicable  INTERVENTION:  Vital HP @ 60 ml/hr + 1 Prostat. Total regimen including propofol is 1973 kcal, 141 g protein, and 1210 ml H2O; meeting 100% of protein and 102% of kcal needs.   RD to do new NFPE when pt not holding fluid.  NUTRITION DIAGNOSIS:   Inadequate oral intake related to inability to eat as evidenced by NPO status.  Ongoing  GOAL:   Patient will meet greater than or equal to 90% of their needs  Ongoing - progressing  MONITOR:   Vent status, Skin, TF tolerance, Weight trends, Labs, I & O's  REASON FOR ASSESSMENT:   Ventilator    ASSESSMENT:   67 y.o. M admitted on 07/05/17 for acute respiratory failure with hypoxia and CHF exacerbation. PMH of HTN, PAD, CHF, CAD, hx of skin cancer, T2DM, hx of gangrene of toe 2014, CKD stg IV, and anemia. 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 Pt intubate 6/5.   Spoke with pt sister at bedside who reports his dry weight is typically 190 lbs and that he was eating well. Pt with some mild fast and muscle depletions that were previously masked by fluid; expect pt with more depletions as pt still with fluid retention.   Spoke with RN regarding propofol, rate to stay where it is for now, may fluctuate between 20 and 30 mcg/kg/min. Told RN will update TF rate.  Propofol: 16.4 ml/hr (30 mcg/kg/min) = 433 kcal  MVe: 10  Temp (24hrs), Avg:98.6 F (37 C), Min:97.2 F (36.2 C), Max:99.7 F (37.6 C)  Medications reviewed: coreg, catapres, ferrous sulfate, apresoline, novolog 0-9 units, lantus, free water 200 ml Q4H, lebothyroxine, pepcid, lasix, zoysn, propofol, vamcocin.   Labs reviewed: Na 153 (H), Cl 119 (H), BG 189 (H), BUN 93 (H), creatinine 2.59 (H), GFR 24 (L), RBC 2.36 (L), hemoglobin 6.3 (L), HCT 21.1 (L), platelets 84 (L).   Net I/O since admit: -8.5 L  Diet Order:   Diet Order           Diet NPO time specified Except for: Sips with Meds  Diet effective now           EDUCATION NEEDS:   Not appropriate for education at this time  Skin:  Skin Assessment: Reviewed RN Assessment  Last BM:  07/05/17  Height:   Ht Readings from Last 1 Encounters:  07/05/17 5\' 8"  (1.727 m)    Weight:   Wt Readings from Last 1 Encounters:  07/09/17 191 lb 12.8 oz (87 kg)    Ideal Body Weight:  64.4 kg  BMI:  Body mass index is 29.16 kg/m.  Estimated Nutritional Needs:   Kcal:  1941 kcal (PS eq: MSJ 1628, MVe 10, tmax 37.6)  Protein:  130-145 grams  Fluid:  >/= 1.75 L or per MD    Hope Budds, Dietetic Intern

## 2017-07-09 NOTE — Progress Notes (Signed)
RT called to patient room due to patient ventilator alarm ringing off as Regulation pressure limited.  On arrival, noted peak pressures were reading in the high 40s.  Suctioned patient and obtained a copious amount of thick, frank red blood.  Patient peak pressures still noted to be high.  Also noticed that patient was dyssynchronous with ventilator and had a longer expiratory time than ventilator was allowing.  Check patient auto-peep and patient noted to have an auto peep of 16 total.  Took patient off and bagged patient and noted that expiratory time was 1 minute and 6 seconds. MD was paged and came to room. Bagged patient for 8 minutes and placed patient back on ventilator and rechecked auto-peep and it was noted to be an auto-peep of 4.  Peak pressures also noted to be 36.  Decreased inspiratory time from 0.8 seconds to 0.7 seconds.  Chest xray was obtained.  Per MD, increased nebulizer treatments to Q4.  Will continue to monitor.

## 2017-07-10 ENCOUNTER — Inpatient Hospital Stay (HOSPITAL_COMMUNITY): Payer: Medicare Other

## 2017-07-10 ENCOUNTER — Ambulatory Visit: Payer: Medicare Other | Admitting: Hematology and Oncology

## 2017-07-10 DIAGNOSIS — N183 Chronic kidney disease, stage 3 (moderate): Secondary | ICD-10-CM

## 2017-07-10 DIAGNOSIS — J9601 Acute respiratory failure with hypoxia: Secondary | ICD-10-CM

## 2017-07-10 DIAGNOSIS — J80 Acute respiratory distress syndrome: Secondary | ICD-10-CM

## 2017-07-10 DIAGNOSIS — E870 Hyperosmolality and hypernatremia: Secondary | ICD-10-CM

## 2017-07-10 LAB — RESPIRATORY PANEL BY PCR
Adenovirus: NOT DETECTED
BORDETELLA PERTUSSIS-RVPCR: NOT DETECTED
CHLAMYDOPHILA PNEUMONIAE-RVPPCR: NOT DETECTED
CORONAVIRUS 229E-RVPPCR: NOT DETECTED
CORONAVIRUS HKU1-RVPPCR: NOT DETECTED
Coronavirus NL63: NOT DETECTED
Coronavirus OC43: NOT DETECTED
Influenza A: NOT DETECTED
Influenza B: NOT DETECTED
Metapneumovirus: NOT DETECTED
Mycoplasma pneumoniae: NOT DETECTED
Parainfluenza Virus 1: NOT DETECTED
Parainfluenza Virus 2: NOT DETECTED
Parainfluenza Virus 3: NOT DETECTED
Parainfluenza Virus 4: NOT DETECTED
Respiratory Syncytial Virus: NOT DETECTED
Rhinovirus / Enterovirus: NOT DETECTED

## 2017-07-10 LAB — GLUCOSE, CAPILLARY
GLUCOSE-CAPILLARY: 113 mg/dL — AB (ref 65–99)
GLUCOSE-CAPILLARY: 109 mg/dL — AB (ref 65–99)
GLUCOSE-CAPILLARY: 119 mg/dL — AB (ref 65–99)
GLUCOSE-CAPILLARY: 121 mg/dL — AB (ref 65–99)
Glucose-Capillary: 108 mg/dL — ABNORMAL HIGH (ref 65–99)
Glucose-Capillary: 109 mg/dL — ABNORMAL HIGH (ref 65–99)
Glucose-Capillary: 113 mg/dL — ABNORMAL HIGH (ref 65–99)

## 2017-07-10 LAB — CBC WITH DIFFERENTIAL/PLATELET
ABS IMMATURE GRANULOCYTES: 0 10*3/uL (ref 0.0–0.1)
BASOS ABS: 0 10*3/uL (ref 0.0–0.1)
BASOS PCT: 0 %
EOS ABS: 0.2 10*3/uL (ref 0.0–0.7)
Eosinophils Relative: 5 %
HCT: 21.3 % — ABNORMAL LOW (ref 39.0–52.0)
Hemoglobin: 6.5 g/dL — CL (ref 13.0–17.0)
Immature Granulocytes: 1 %
Lymphocytes Relative: 17 %
Lymphs Abs: 0.7 10*3/uL (ref 0.7–4.0)
MCH: 27.1 pg (ref 26.0–34.0)
MCHC: 30.5 g/dL (ref 30.0–36.0)
MCV: 88.8 fL (ref 78.0–100.0)
Monocytes Absolute: 0.2 10*3/uL (ref 0.1–1.0)
Monocytes Relative: 6 %
NEUTROS PCT: 71 %
Neutro Abs: 2.8 10*3/uL (ref 1.7–7.7)
PLATELETS: 88 10*3/uL — AB (ref 150–400)
RBC: 2.4 MIL/uL — AB (ref 4.22–5.81)
RDW: 18.3 % — AB (ref 11.5–15.5)
WBC: 3.9 10*3/uL — AB (ref 4.0–10.5)

## 2017-07-10 LAB — LACTATE DEHYDROGENASE: LDH: 223 U/L — AB (ref 98–192)

## 2017-07-10 LAB — BASIC METABOLIC PANEL
ANION GAP: 8 (ref 5–15)
BUN: 89 mg/dL — ABNORMAL HIGH (ref 6–20)
CO2: 25 mmol/L (ref 22–32)
CREATININE: 2.66 mg/dL — AB (ref 0.61–1.24)
Calcium: 7.8 mg/dL — ABNORMAL LOW (ref 8.9–10.3)
Chloride: 118 mmol/L — ABNORMAL HIGH (ref 101–111)
GFR, EST AFRICAN AMERICAN: 27 mL/min — AB (ref >60)
GFR, EST NON AFRICAN AMERICAN: 23 mL/min — AB (ref >60)
Glucose, Bld: 114 mg/dL — ABNORMAL HIGH (ref 65–99)
Potassium: 3.8 mmol/L (ref 3.5–5.1)
SODIUM: 151 mmol/L — AB (ref 135–145)

## 2017-07-10 LAB — HEPARIN LEVEL (UNFRACTIONATED): Heparin Unfractionated: 0.67 IU/mL (ref 0.30–0.70)

## 2017-07-10 LAB — STREP PNEUMONIAE URINARY ANTIGEN: STREP PNEUMO URINARY ANTIGEN: NEGATIVE

## 2017-07-10 LAB — PROCALCITONIN: Procalcitonin: 0.28 ng/mL

## 2017-07-10 LAB — OCCULT BLOOD X 1 CARD TO LAB, STOOL: Fecal Occult Bld: POSITIVE — AB

## 2017-07-10 LAB — HEMOGLOBIN AND HEMATOCRIT, BLOOD
HEMATOCRIT: 25.1 % — AB (ref 39.0–52.0)
HEMOGLOBIN: 7.7 g/dL — AB (ref 13.0–17.0)

## 2017-07-10 LAB — TSH: TSH: 3.338 u[IU]/mL (ref 0.350–4.500)

## 2017-07-10 LAB — PREPARE RBC (CROSSMATCH)

## 2017-07-10 LAB — CULTURE, RESPIRATORY: CULTURE: NORMAL

## 2017-07-10 LAB — PHOSPHORUS
PHOSPHORUS: 4.1 mg/dL (ref 2.5–4.6)
Phosphorus: 4.2 mg/dL (ref 2.5–4.6)

## 2017-07-10 LAB — SEDIMENTATION RATE: SED RATE: 75 mm/h — AB (ref 0–16)

## 2017-07-10 LAB — MAGNESIUM
MAGNESIUM: 2.3 mg/dL (ref 1.7–2.4)
Magnesium: 2.6 mg/dL — ABNORMAL HIGH (ref 1.7–2.4)

## 2017-07-10 LAB — APTT: aPTT: 74 seconds — ABNORMAL HIGH (ref 24–36)

## 2017-07-10 LAB — CULTURE, RESPIRATORY W GRAM STAIN

## 2017-07-10 MED ORDER — VITAL HIGH PROTEIN PO LIQD
1000.0000 mL | ORAL | Status: DC
  Administered 2017-07-10: 1000 mL
  Administered 2017-07-11: 14:00:00

## 2017-07-10 MED ORDER — MAGNESIUM SULFATE 2 GM/50ML IV SOLN
INTRAVENOUS | Status: AC
  Filled 2017-07-10: qty 50

## 2017-07-10 MED ORDER — FREE WATER
100.0000 mL | Status: DC
  Administered 2017-07-10 – 2017-07-16 (×36): 100 mL

## 2017-07-10 MED ORDER — PRO-STAT SUGAR FREE PO LIQD
30.0000 mL | Freq: Two times a day (BID) | ORAL | Status: DC
  Administered 2017-07-10 – 2017-07-13 (×6): 30 mL
  Filled 2017-07-10 (×6): qty 30

## 2017-07-10 MED ORDER — HEPARIN SODIUM (PORCINE) 5000 UNIT/ML IJ SOLN
5000.0000 [IU] | Freq: Three times a day (TID) | INTRAMUSCULAR | Status: DC
  Administered 2017-07-10 – 2017-07-12 (×6): 5000 [IU] via SUBCUTANEOUS
  Filled 2017-07-10 (×6): qty 1

## 2017-07-10 MED ORDER — SODIUM CHLORIDE 0.9 % IV SOLN
Freq: Once | INTRAVENOUS | Status: AC
  Administered 2017-07-10: 07:00:00 via INTRAVENOUS

## 2017-07-10 MED ORDER — SODIUM CHLORIDE 0.9 % IV SOLN
500.0000 mg | INTRAVENOUS | Status: AC
  Administered 2017-07-10 – 2017-07-12 (×3): 500 mg via INTRAVENOUS
  Filled 2017-07-10 (×3): qty 500

## 2017-07-10 MED ORDER — DEXTROSE 5 % IV SOLN
INTRAVENOUS | Status: DC
  Administered 2017-07-10 – 2017-07-13 (×6): via INTRAVENOUS

## 2017-07-10 NOTE — Progress Notes (Signed)
FPTS Social Note  Patient remains intubated and sedated.  We will continue to follow and resume care when he is extubated and transferred to the floor.  We appreciate the excellent care provided by CCM.

## 2017-07-10 NOTE — Progress Notes (Addendum)
Nutrition Follow-up  DOCUMENTATION CODES:   Not applicable  INTERVENTION:  Vital HP @ 25 ml/hr (600 ml/24 hrs) + 6 Prostat. Total regimen with propofol providing 1889 kcal, 142.5 g protein, and 1587 ml H2O; meeting 100% of kcal and protein needs.   NUTRITION DIAGNOSIS:   Inadequate oral intake related to inability to eat as evidenced by NPO status.  Ongoing  GOAL:   Patient will meet greater than or equal to 90% of their needs  Ongoing - Met with TF  MONITOR:   Vent status, Skin, TF tolerance, Weight trends, Labs, I & O's  REASON FOR ASSESSMENT:   Ventilator    ASSESSMENT:   67 y.o. M admitted on 07/05/17 for acute respiratory failure with hypoxia and CHF exacerbation. PMH of HTN, PAD, CHF, CAD, hx of skin cancer, T2DM, hx of gangrene of toe 2014, CKD stg IV, and anemia. 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 Pt intubate 6/5.   Cortrak placement 6/7.  RN reports that pt propofol rate to stay at 20 ml/hr for now.  Pt on vent with no family at bedside.   Propofol @ 26.1 ml/hr = 689 kcal MVe: 8.5  Temp (24hrs), Avg:97.8 F (36.6 C), Min:96.6 F (35.9 C), Max:99.7 F (37.6 C)  Medications reviewed: coreg, peridex, catapres, cardura, PS BID, vital HP @ 40 ml/hr, ferrous sulfate, heparin, apresoline, novolog 0-9 untis, lantus, levothyroxine, nimbex, D5, pepcid, fentanyl, lasix, propofol, vancocin.   Labs reviewed: Na 151 (H), Cl 118 (H), BG 114 (H), BUN 89 (H), creatinine 2.66 (H), magnesium 2.6 (H), GFR 23 (L), WBC 3.9 (L), RBC 2.4 (L), hemoglobin 6.5 (L), HCT 21.3 (L), platelets 88 (L).   Net I/O since admit: - 5.5 L  Diet Order:   Diet Order           Diet NPO time specified Except for: Sips with Meds  Diet effective now          EDUCATION NEEDS:   Not appropriate for education at this time  Skin:  Skin Assessment: Reviewed RN Assessment  Last BM:  07/05/17  Height:   Ht Readings from Last 1 Encounters:  07/05/17 _0  (1.727 m)    Weight:   Wt Readings from  Last 1 Encounters:  07/10/17 200 lb 2.8 oz (90.8 kg)    Ideal Body Weight:  64.4 kg  BMI:  Body mass index is 30.44 kg/m.  Estimated Nutritional Needs:   Kcal:  1893 kcal (PS eq: MSJ 1628, MVe 8.5, tmax 37.6)  Protein:  130-145 grams  Fluid:  >/= 1.75 L or per MD    Hope Budds, Dietetic Intern

## 2017-07-10 NOTE — Progress Notes (Signed)
eLink Physician-Brief Progress Note [name] DOB: March 30, 1950 MRN: [id number]   Date of Service  07/10/2017  HPI/Events of Note  Hemoglobin 6.3 gm %  eICU Interventions  Transfuse 1 unit of PRBC        Lissete Maestas U Marks Scalera 07/10/2017, 5:33 AM

## 2017-07-10 NOTE — Progress Notes (Signed)
Positive FOB reported to Dr. Pearline Cables. No orders at this time.

## 2017-07-10 NOTE — Progress Notes (Signed)
Marni Griffon, NP updated on pt's Hg of 7.7. No new orders at this time.

## 2017-07-10 NOTE — Progress Notes (Addendum)
[name]  [name] MRN: [id number] DOB: January 06, 1951    ADMISSION DATE:  07/05/2017 CONSULTATION DATE:  07/07/2017  REFERRING MD:  Leonard Dawn, MD  REASON FOR CONSULTATION:  Respiratory distress  HISTORY OF PRESENT ILLNESS  This 67 y.o. African American male is seen in consultation at the request of Dr. Guadalupe [name] for recommendations on further evaluation and management of respiratory distress. The patient was admitted on 07/05/2017 with a clinical impression of CHF exacerbation. He has been BiPAP dependent for about 36 hours or so. Rapid response team was called earlier this evening for increased work of breathing with use of accessory muscles, tachypnea (40s) and increasing FiO2 requirements. The patient was treated with adjustment of BiPAP settings and an additional furosemide dose (40 mg). At the time of clinical interview, the patient remains on BiPAP 12/8, FiO2 50%. RR is 28-30.  SPO2 90-91%.  He is awake, alert and follows commands. He is desperately asking for water; however, the patient precipitously desaturates with removal of the BiPAP mask.  Aside from being thirsty, the patient is tolerating BiPAP therapy quite well.  The service was consulted for consideration of endotracheal intubation and/or transfer to ICU.  Interval Hx/subjective  6/2: admitted with increased dyspnea and started on diuresis and BiPAP support for CHF exacerbation. 6/4: rapid response called for increased work of breathing. Remains BiPAP dependent. Transfer to ICU for closer monitoring. May require intubation. 07/07/2017 0800 hrs. tolerate noninvasive mechanical ventilatory support FiO2 is now down to 80% from 100 his pulmonary status remains tenuous 6/5 intubated due to worsening hypoxemia and respiratory distress despite BIPAP. He was transfused with 1 U PRBC 6/5 before intubation and CVC insertion  6/6 transfused again. Required escalating [name]/FIO2. dyssynchronous w/  vent, eventually placed on IV NMB infusion 6/7: better over night. Coming back down off high [name] and FIO2. On-going anemia w/out evidence of bleeding.   VITAL SIGNS: Blood Pressure (Abnormal) 117/57   Pulse (Abnormal) 56   Temperature (Abnormal) 96.8 F (36 C) (Rectal)   Respiration 14   Height 5\' 8"  (1.727 m)   Weight 200 lb 2.8 oz (90.8 kg)   Oxygen Saturation 99%   Body Mass Index 30.44 kg/m   VENTILATOR SETTINGS: Vent Mode: PRVC FiO2 (%):  [50 %-100 %] 50 % Set Rate:  [14 bmp] 14 bmp Vt Set:  [550 mL] 550 mL [name]:  [10 cmH20] 10 cmH20 Plateau Pressure:  [21 cmH20-29 cmH20] 24 cmH20  INTAKE / OUTPUT:  Intake/Output Summary (Last 24 hours) at 07/10/2017 0942 Last data filed at 07/10/2017 9323 Gross per 24 hour  Intake 4682.46 ml  Output 1880 ml  Net 2802.46 ml   PHYSICAL EXAMINATION: General: 67 year old African-American male currently heavily sedated on ventilator.  Currently on neuromuscular blockade HEENT normocephalic atraumatic no jugular venous distention orally intubated Pulmonary: Clear mechanically assisted breath equal chest rise no wheezing Cardiac: Regular rate and rhythm Abdomen: Soft nontender hypoactive Extremities: Left AKA.  Right lower extremity warm and dry no significant edema Neuro: Heavily sedated, currently on neuromuscular blockade infusion GU: Clear yellow  LABS:  ABG Recent Labs  Lab 07/07/17 2001 07/07/17 2229 07/08/17 1121  PHART 7.453* 7.411 7.304*  PCO2ART 31.3* 33.5 42.7  PO2ART 50.0* 53.0* 110.0*    Cardiac Enzymes Lab Results  Component Value Date   TROPIPOC 0.02 07/05/2017   Lab Results  Component Value Date   BNP 175.3 (H) 07/09/2017   BNP 464.2 (H) 07/07/2017   BNP 595.9 (H)  81/15/7262    BASIC METABOLIC PROFILE Recent Labs  Lab 07/08/17 0256 07/08/17 1722 07/09/17 0210 07/09/17 1457 07/10/17 0409  NA 151*  --  153*  --  151*  K 4.3 4.0 3.9  --  3.8  CL 120*  --  119*  --  118*  CO2 21*  --  25  --  25    BUN 95*  --  93*  --  89*  CREATININE 2.37*  --  2.59*  --  2.66*  GLUCOSE 190*  --  189*  --  114*  CALCIUM 8.4*  --  8.1*  --  7.8*  MG 2.5*  --  2.4 2.5* 2.6*  PHOS 4.9*  --   --  4.4 4.2    Glucose Recent Labs  Lab 07/09/17 1143 07/09/17 1619 07/09/17 1945 07/09/17 2355 07/10/17 0337 07/10/17 0721  GLUCAP 177* 157* 134* 108* 113* 109*    CBC Recent Labs  Lab 07/08/17 0256 07/09/17 0210 07/10/17 0409  WBC 9.7 5.4 3.9*  HGB 6.3* 6.3* 6.5*  HCT 21.0* 21.1* 21.3*  PLT 107* 84* 88*    COAGULATION STUDIES Recent Labs  Lab 07/05/17 1036  07/08/17 0256 07/09/17 0210 07/10/17 0409  APTT  --    < > 99* 85* 74*  INR 1.37  --   --   --   --    < > = values in this interval not displayed.      IMAGING: Dg Chest Port 1 View  Result Date: 07/10/2017 CLINICAL DATA:  Respiratory failure EXAM: PORTABLE CHEST 1 VIEW COMPARISON:  07/09/2017 FINDINGS: Endotracheal tube and NG tube are unchanged. Bilateral airspace opacities, most confluent in the left mid and lower lung, stable since prior study. Heart is upper limits normal in size. No visible effusions. IMPRESSION: Bilateral asymmetric airspace disease, left greater than right could reflect asymmetric edema or infection. No real change. Electronically Signed   By: Leonard [name] M.D.   On: 07/10/2017 09:02   Dg Chest Port 1 View  Result Date: 07/09/2017 CLINICAL DATA:  67 year old male with history of shortness of breath. EXAM: PORTABLE CHEST 1 VIEW COMPARISON:  Chest x-ray 07/09/2017. FINDINGS: An endotracheal tube is in place with tip 3.2 cm above the carina. A nasogastric tube is seen extending into the stomach, however, the tip of the nasogastric tube extends below the lower margin of the image. There is cephalization of the pulmonary vasculature, indistinctness of the interstitial markings, and patchy airspace disease throughout the lungs bilaterally suggestive of moderate pulmonary edema. Trace left pleural effusion. Heart  size is mildly enlarged. Upper mediastinal contours are distorted by patient's rotation to the right. Aortic atherosclerosis. Status post median sternotomy for CABG. IMPRESSION: 1. The appearance of the chest suggests worsening congestive heart failure, as above. 2. Aortic atherosclerosis. Electronically Signed   By: Vinnie Langton M.D.   On: 07/09/2017 12:44   Dg Chest Port 1 View  Result Date: 07/09/2017 CLINICAL DATA:  Check gastric catheter placement EXAM: PORTABLE CHEST 1 VIEW COMPARISON:  Film from earlier in the same day. FINDINGS: Cardiac shadow is stable. Postsurgical changes are again seen. The patchy infiltrates identified in both lungs are again noted and stable. Nasogastric catheter is again seen. There is again a loop identified in the hypopharynx. The previously seen kink is been reduced although some redundant catheter is present. Endotracheal tube is again noted. IMPRESSION: Redundancy of the gastric catheter within the hypopharynx. The previously seen kink has been reduced although continued  withdrawal is recommended. Electronically Signed   By: [name] M.D.   On: 07/09/2017 10:14    SIGNIFICANT EVENTS:  CULTURES: Blood cultures 6/4>>> Respiratory cultures 6/5>>> RVP 6/6>>> u strep 6/7>>> u legionella 6/7>>>  abx vanc 6/5>>> Zosyn 6/5>>> azith 6/7>>>  ASSESSMENT / PLAN:   ACUTE HYPOXEMIC RESPIRATORY FAILURE in setting of edema, c/b aspiration PNA and ARDS.  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 Aeration has improved some.  -on 6/6 required increased Oxygen and [name]. Placed on on NIPPV d/t vent asynchrony Sats now stable. FIO2 needs  Improving as of 6/7. Aeration improved on CXR Plan Cont IV NMB for another 24 -48 hrs Cont Low tidal vol ventilation and titrate [name]/FIO2 per protocol PAD protocol RAS -4 while on NMB F/u ab cxr Cont lasix Day # 3 vanc and zosyn Day # 1 azithro F/u RVP,  send U strep & legionella antigen F/u BAL Trend PCT  Acute on chronic diastolic HF w/ pulmonary edema  CHF EXACERBATION Bradycardia -2D echo demonstrates grade 2 diastolic dysfunction Plan Cont lasix BB hold orders Cont norvasc, cont catapres patch Cont lipitor  Cont tele monitoring  CHRONIC KIDNEY DISEASE, STAGE III PARTIALLY COMPENSATED NON-ANION GAP METABOLIC ACIDOSIS Hypernatremia  On-going hypernatremia; water def still at 3.56 liters Cr continues to Climb (looks like baseline scr ~ 2.3 to 2.8; now > 5 liters neg Plan Cont current lasix dosing 100mg  IV q12 Change free water to D5W at 70 cc/hr  BMP daily   CHRONIC NORMOCYTIC ANEMIA/ANEMIA OF CHRONIC DISEASE MONOCLONAL GAMMOPATHY OF UNKNOWN SIGNIFICANCE hgb remains low. Got 1 unit PRBC on 6/6 and now completing second unit am 6/7 Plan Stop heparin gtt Change to [place] heparin Trend CBC Transfuse per protocol  Ck FOB Ck LDH  Remote DVT august 2018 Plan Checking LE [place] as we are stopping IV heparin w/ ongoing anemia  Mild Thrombocytopenia  -mild drop Plan Trend while on heparin.    TYPE 2 DIABETES MELLITUS with NEPHROPATHY, NEUROPATHY, VASCULAR DISEASE Plan ssi     DVT prophylaxis: IV heparin to [place] heparin  SUP: pepcid Diet: start tubefeeds 6/7 Activity: BR Disposition : ICU   FAMILY  - Updates: no family at bedside  - Inter-disciplinary family meet or Palliative Care meeting due by: 07/13/2017   Erick Colace ACNP-BC [place] Pager # [phone] OR # [phone] if no answer

## 2017-07-10 NOTE — Progress Notes (Signed)
HgB 6.5 reported to [facility], MD. Orders to transfuse 1 unit of PRBC was given.

## 2017-07-10 NOTE — Progress Notes (Signed)
Cortrak Tube Team Note:  Consult received to place a Cortrak feeding tube.   A 10 F Cortrak tube was placed in the RIGHT nare and secured with a nasal bridle at 100 cm. Per the Cortrak monitor reading the tube tip is post-pyloric.    X-ray is required, abdominal x-ray has been ordered by the Cortrak team. Please confirm tube placement before using the Cortrak tube.   If the tube becomes dislodged please keep the tube and contact the Cortrak team at [email or web address] (password [email or web address]) for replacement.  If after hours and replacement cannot be delayed, place a NG tube and confirm placement with an abdominal x-ray.   Kerman Passey MS, RD, [place], [place] [phone] Pager  [phone] Weekend/On-Call Pager

## 2017-07-11 ENCOUNTER — Inpatient Hospital Stay (HOSPITAL_COMMUNITY): Payer: Medicare Other

## 2017-07-11 DIAGNOSIS — J9601 Acute respiratory failure with hypoxia: Secondary | ICD-10-CM

## 2017-07-11 LAB — BPAM RBC
Blood Product Expiration Date: 201906262359
Blood Product Expiration Date: 201907012359
Blood Product Expiration Date: 201907112359
ISSUE DATE / TIME: 201906050744
ISSUE DATE / TIME: 201906060836
ISSUE DATE / TIME: 201906070619
Unit Type and Rh: 1700
Unit Type and Rh: 1700
Unit Type and Rh: 1700

## 2017-07-11 LAB — COMPREHENSIVE METABOLIC PANEL
ALBUMIN: 2.3 g/dL — AB (ref 3.5–5.0)
ALT: 7 U/L — ABNORMAL LOW (ref 17–63)
ANION GAP: 10 (ref 5–15)
AST: 7 U/L — AB (ref 15–41)
Alkaline Phosphatase: 60 U/L (ref 38–126)
BILIRUBIN TOTAL: 0.8 mg/dL (ref 0.3–1.2)
BUN: 85 mg/dL — AB (ref 6–20)
CO2: 23 mmol/L (ref 22–32)
Calcium: 7.8 mg/dL — ABNORMAL LOW (ref 8.9–10.3)
Chloride: 117 mmol/L — ABNORMAL HIGH (ref 101–111)
Creatinine, Ser: 2.91 mg/dL — ABNORMAL HIGH (ref 0.61–1.24)
GFR calc Af Amer: 24 mL/min — ABNORMAL LOW (ref >60)
GFR calc non Af Amer: 21 mL/min — ABNORMAL LOW (ref >60)
GLUCOSE: 130 mg/dL — AB (ref 65–99)
POTASSIUM: 3.6 mmol/L (ref 3.5–5.1)
Sodium: 150 mmol/L — ABNORMAL HIGH (ref 135–145)
Total Protein: 5.1 g/dL — ABNORMAL LOW (ref 6.5–8.1)

## 2017-07-11 LAB — LEGIONELLA PNEUMOPHILA SEROGP 1 UR AG: L. pneumophila Serogp 1 Ur Ag: NEGATIVE

## 2017-07-11 LAB — GLUCOSE, CAPILLARY
GLUCOSE-CAPILLARY: 106 mg/dL — AB (ref 65–99)
GLUCOSE-CAPILLARY: 110 mg/dL — AB (ref 65–99)
GLUCOSE-CAPILLARY: 160 mg/dL — AB (ref 65–99)
Glucose-Capillary: 147 mg/dL — ABNORMAL HIGH (ref 65–99)
Glucose-Capillary: 168 mg/dL — ABNORMAL HIGH (ref 65–99)

## 2017-07-11 LAB — TYPE AND SCREEN
ABO/RH(D): B NEG
Antibody Screen: NEGATIVE
Unit division: 0
Unit division: 0
Unit division: 0

## 2017-07-11 LAB — POCT I-STAT 3, ART BLOOD GAS (G3+)
Acid-base deficit: 3 mmol/L — ABNORMAL HIGH (ref 0.0–2.0)
Bicarbonate: 21.6 mmol/L (ref 20.0–28.0)
O2 SAT: 91 %
PCO2 ART: 38.7 mmHg (ref 32.0–48.0)
PH ART: 7.357 (ref 7.350–7.450)
TCO2: 23 mmol/L (ref 22–32)
pO2, Arterial: 66 mmHg — ABNORMAL LOW (ref 83.0–108.0)

## 2017-07-11 LAB — PHOSPHORUS
PHOSPHORUS: 5 mg/dL — AB (ref 2.5–4.6)
Phosphorus: 4.3 mg/dL (ref 2.5–4.6)

## 2017-07-11 LAB — MAGNESIUM
MAGNESIUM: 2.3 mg/dL (ref 1.7–2.4)
Magnesium: 2.4 mg/dL (ref 1.7–2.4)

## 2017-07-11 LAB — TRIGLYCERIDES: Triglycerides: 165 mg/dL — ABNORMAL HIGH (ref <150)

## 2017-07-11 LAB — HIV ANTIBODY (ROUTINE TESTING W REFLEX): HIV SCREEN 4TH GENERATION: NONREACTIVE

## 2017-07-11 MED ORDER — FUROSEMIDE 10 MG/ML IJ SOLN
120.0000 mg | Freq: Once | INTRAMUSCULAR | Status: AC
  Administered 2017-07-12: 120 mg via INTRAVENOUS
  Filled 2017-07-11: qty 12

## 2017-07-11 MED ORDER — VITAL HIGH PROTEIN PO LIQD
1000.0000 mL | ORAL | Status: DC
  Administered 2017-07-12 – 2017-07-13 (×2): 1000 mL

## 2017-07-11 NOTE — Plan of Care (Signed)
  Problem: Clinical Measurements: Goal: Diagnostic test results will improve Outcome: Progressing Goal: Cardiovascular complication will be avoided Outcome: Progressing   Problem: Nutrition: Goal: Adequate nutrition will be maintained Outcome: Progressing   Problem: Elimination: Goal: Will not experience complications related to bowel motility Outcome: Progressing   Problem: Pain Managment: Goal: General experience of comfort will improve Outcome: Progressing   Problem: Skin Integrity: Goal: Risk for impaired skin integrity will decrease Outcome: Progressing

## 2017-07-11 NOTE — Progress Notes (Signed)
FPTS Social Note Following along socially. Will resume care when patient is stable for floor. Appreciate excellent care from CCM.  Bufford Lope, DO 07/11/2017, 8:09 AM PGY-2, [place] [facility] pager [phone]

## 2017-07-11 NOTE — Progress Notes (Addendum)
Late Entry  Patient with NMB, consistently having TOF 0/4. Per policy, paralytic was stopped at 1310 and began to retest for twitches to determine baseline amps. Patient continues to have TOF 0/4 despite being off paralytic for 4.5 hours. Dr. Pearline Cables with CCM aware and stated it was okay to leave paralytic off as long as patient was tolerating it well. Patient appears comfortable and is synchronous with the ventilator. He remains sedated. Will continue to monitor.  [name], RN  Addendum: Prior to stopping paralytic, TOF was tested on a second location. Both locations remained 0/4 throughout.

## 2017-07-11 NOTE — Progress Notes (Signed)
Patient transported to CT and back to room 2H16.

## 2017-07-11 NOTE — Progress Notes (Signed)
LE venous duplex prelim: negative for DVT in visualized veins.  Jahlani Lorentz Eunice, RDMS, RVT  

## 2017-07-11 NOTE — Progress Notes (Signed)
Cortrak Tube Team Note:  RD consulted due to partially occluded cortrak tube; recent ct notes tube coiled in stomach.   Bridle was found to also be partially occluding tube. RD reinserted stylet, retracted tube, eliminated coil. Tube tip then re-progressed to postpyloric location, approximately D2. Rebridled tube at 93 cm mark. RD adjusted LDA info. Restarted TF.   No x-ray is required. RN may begin using tube.   If the tube becomes dislodged please keep the tube and contact the Cortrak team at [email or web address] (password [email or web address]) for replacement.  If after hours and replacement cannot be delayed, place a NG tube and confirm placement with an abdominal x-ray.   Burtis Junes RD, LDN, CNSC Clinical Nutrition Available Tues-Sat via Pager: [phone] 07/11/2017 12:43 PM

## 2017-07-11 NOTE — Progress Notes (Signed)
[facility]  Patient Name: HUBERT RAATZ MRN: [id number] DOB: 1950/06/19    ADMISSION DATE:  07/05/2017 CONSULTATION DATE:  07/07/2017  REFERRING MD:  Guadalupe Dawn, MD  REASON FOR CONSULTATION:  Respiratory distress  HISTORY OF PRESENT ILLNESS  This 67 y.o. African American male is seen in consultation at the request of Dr. Guadalupe Dawn for recommendations on further evaluation and management of respiratory distress. The patient was admitted on 07/05/2017 with a clinical impression of CHF exacerbation. He has been BiPAP dependent for about 36 hours or so. Rapid response team was called earlier this evening for increased work of breathing with use of accessory muscles, tachypnea (40s) and increasing FiO2 requirements. The patient was treated with adjustment of BiPAP settings and an additional furosemide dose (40 mg). At the time of clinical interview, the patient remains on BiPAP 12/8, FiO2 50%. RR is 28-30.  SPO2 90-91%.  He is awake, alert and follows commands. He is desperately asking for water; however, the patient precipitously desaturates with removal of the BiPAP mask.  Aside from being thirsty, the patient is tolerating BiPAP therapy quite well.  The service was consulted for consideration of endotracheal intubation and/or transfer to ICU.  Interval Hx/subjective  6/2: admitted with increased dyspnea and started on diuresis and BiPAP support for CHF exacerbation. 6/4: rapid response called for increased work of breathing. Remains BiPAP dependent. Transfer to ICU for closer monitoring. May require intubation. 07/07/2017 0800 hrs. tolerate noninvasive mechanical ventilatory support FiO2 is now down to 80% from 100 his pulmonary status remains tenuous 6/5 intubated due to worsening hypoxemia and respiratory distress despite BIPAP. He was transfused with 1 U PRBC 6/5 before intubation and CVC insertion  6/6 transfused again. Required escalating [name]/FIO2. dyssynchronous w/  vent, eventually placed on IV NMB infusion 6/7: better over night. Coming back down off high [name] and FIO2. On-going anemia w/out evidence of bleeding.  There is been no subjective change overnight.  He did have a bowel movement which was brown.  Stool yesterday was guaiac positive.  He is sedated and pharmacologically paralyzed.  There have been some bloody respiratory secretions.  VITAL SIGNS: BP (!) 120/53   Pulse 62   Temp 98.8 F (37.1 C)   Resp 16   Ht 5\' 8"  (1.727 m)   Wt 198 lb 13.7 oz (90.2 kg)   SpO2 96%   BMI 30.24 kg/m   VENTILATOR SETTINGS: Vent Mode: PRVC FiO2 (%):  [40 %-50 %] 40 % Set Rate:  [14 bmp] 14 bmp Vt Set:  [550 mL] 550 mL [name]:  [8 cmH20-10 cmH20] 8 cmH20 Plateau Pressure:  [21 cmH20-26 cmH20] 23 cmH20  INTAKE / OUTPUT:  Intake/Output Summary (Last 24 hours) at 07/11/2017 0849 Last data filed at 07/11/2017 0600 Gross per 24 hour  Intake 4222.73 ml  Output 3210 ml  Net 1012.73 ml   PHYSICAL EXAMINATION: General: This is a thin elderly male who is orally intubated form and pharmacologically paralyzed.   HEENT normocephalic atraumatic no jugular venous distention orally intubated Pulmonary: Respirations are unlabored, there is symmetric air movement, no wheezes no dependent rales and very few scattered rhonchi.   Cardiac: S1 and S2 are regular without murmur rub or gallop.  He has no JVD, he has no dependent edema.   Abdomen: There are no masses.   Extremities: Left AKA.  Right lower extremity warm and dry no significant edema Neuro: Heavily sedated, currently on neuromuscular blockade infusion GU: Clear yellow  LABS:  ABG Recent Labs  Lab 07/07/17 2001 07/07/17 2229 07/08/17 1121  PHART 7.453* 7.411 7.304*  PCO2ART 31.3* 33.5 42.7  PO2ART 50.0* 53.0* 110.0*    Cardiac Enzymes Lab Results  Component Value Date   TROPIPOC 0.02 07/05/2017   Lab Results  Component Value Date   BNP 175.3 (H) 07/09/2017   BNP 464.2 (H) 07/07/2017   BNP  595.9 (H) 22/29/7989    BASIC METABOLIC PROFILE Recent Labs  Lab 07/09/17 0210  07/10/17 0409 07/10/17 1117 07/11/17 0428  NA 153*  --  151*  --  150*  K 3.9  --  3.8  --  3.6  CL 119*  --  118*  --  117*  CO2 25  --  25  --  23  BUN 93*  --  89*  --  85*  CREATININE 2.59*  --  2.66*  --  2.91*  GLUCOSE 189*  --  114*  --  130*  CALCIUM 8.1*  --  7.8*  --  7.8*  MG 2.4   < > 2.6* 2.3 2.3  PHOS  --    < > 4.2 4.1 4.3   < > = values in this interval not displayed.    Glucose Recent Labs  Lab 07/10/17 1136 07/10/17 1520 07/10/17 1937 07/10/17 2338 07/11/17 0438 07/11/17 0827  GLUCAP 119* 121* 113* 109* 106* 110*    CBC Recent Labs  Lab 07/08/17 0256 07/09/17 0210 07/10/17 0409 07/10/17 1117  WBC 9.7 5.4 3.9*  --   HGB 6.3* 6.3* 6.5* 7.7*  HCT 21.0* 21.1* 21.3* 25.1*  PLT 107* 84* 88*  --     COAGULATION STUDIES Recent Labs  Lab 07/05/17 1036  07/08/17 0256 07/09/17 0210 07/10/17 0409  APTT  --    < > 99* 85* 74*  INR 1.37  --   --   --   --    < > = values in this interval not displayed.      IMAGING: Dg Chest Port 1 View  Result Date: 07/11/2017 CLINICAL DATA:  Pneumonia EXAM: PORTABLE CHEST 1 VIEW COMPARISON:  Yesterday FINDINGS: Endotracheal tube tip 2 cm above the carina. A feeding tube reaches the stomach at least. Bilateral interstitial and airspace opacity with interval increase. No definite pleural fluid. No pneumothorax. Stable postoperative heart size. There are changes of CABG. IMPRESSION: 1. Worsening bilateral lung opacity with symmetry favoring edema over infection. 2. Lower endotracheal tube, tip 2 cm above the carina. Electronically Signed   By: Monte Fantasia M.D.   On: 07/11/2017 08:07   Dg Abd Portable 1v  Result Date: 07/10/2017 CLINICAL DATA:  Feeding tube insertion. EXAM: PORTABLE ABDOMEN - 1 VIEW COMPARISON:  07/08/2017 FINDINGS: A feeding tube has been inserted. The tip is in the region of proximal duodenum. Bowel gas pattern is  normal. IMPRESSION: Feeding tube tip in the proximal duodenum. Electronically Signed   By: Lorriane Shire M.D.   On: 07/10/2017 13:08    SIGNIFICANT EVENTS:  CULTURES: Blood cultures 6/4>>> Respiratory cultures 6/5>>> RVP 6/6>>> u strep 6/7>>> u legionella 6/7>>>  abx vanc 6/5>>> Zosyn 6/5>>> azith 6/7>>>  ASSESSMENT / PLAN:   ACUTE HYPOXEMIC RESPIRATORY FAILURE in setting of edema, c/b aspiration PNA and ARDS.  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  Clearly he has diastolic dysfunction and initial films suggested CHF however chest x-ray yesterday was really more remarkable for consolidation of the left  lower lobe.  Today we again have a CHF appearance on the chest x-ray.  I have ordered a noncontrast CT to better define the pathology.  In the interim we are continuing to treat for atypical pneumonia and continue his current diuretics and afterload reducing agents. Plan Cont IV NMB for another 24 -48 hrs Cont Low tidal vol ventilation and titrate [name]/FIO2 per protocol PAD protocol RAS -4 while on NMB F/u ab cxr Cont lasix Day # 4 vanc and zosyn Day # 2 azithro F/u RVP, send U strep & legionella antigen F/u BAL Trend PCT  Acute on chronic diastolic HF w/ pulmonary edema  CHF EXACERBATION Bradycardia -2D echo demonstrates grade 2 diastolic dysfunction Plan Cont lasix BB hold orders Cont norvasc, cont catapres patch Cont lipitor  Cont tele monitoring  CHRONIC KIDNEY DISEASE, STAGE III PARTIALLY COMPENSATED NON-ANION GAP METABOLIC ACIDOSIS Hypernatremia  On-going hypernatremia; water def still at 3.56 liters Cr continues to Climb (looks like baseline scr ~ 2.3 to 2.8; now > 5 liters neg Plan Cont current lasix dosing 100mg  IV q12 Change free water to D5W at 70 cc/hr  BMP daily   CHRONIC NORMOCYTIC ANEMIA/ANEMIA OF CHRONIC DISEASE MONOCLONAL GAMMOPATHY OF UNKNOWN SIGNIFICANCE I will be  repeating a serum protein electrophoresis to determine whether or not he has had a progression digestive of overt myeloma.   Plan Stop heparin gtt Change to [place] heparin Trend CBC Transfuse per protocol  Ck FOB Ck LDH  Remote DVT august 2018 Plan Checking LE [place] as we are stopping IV heparin w/ ongoing anemia  Mild Thrombocytopenia  -mild drop Plan Trend while on heparin.    TYPE 2 DIABETES MELLITUS with NEPHROPATHY, NEUROPATHY, VASCULAR DISEASE Plan ssi     DVT prophylaxis: IV heparin to [place] heparin  SUP: pepcid Diet: start tubefeeds 6/7 Activity: BR Disposition : ICU   FAMILY  - Updates: no family at bedside  Greater than 32 minutes has been spent in the care of this unstable patient today who is requiring mechanical ventilatory support for life support.  Lars Masson, MD Critical Care

## 2017-07-12 LAB — CBC WITH DIFFERENTIAL/PLATELET
ABS IMMATURE GRANULOCYTES: 0 10*3/uL (ref 0.0–0.1)
BASOS PCT: 0 %
Basophils Absolute: 0 10*3/uL (ref 0.0–0.1)
Eosinophils Absolute: 0.3 10*3/uL (ref 0.0–0.7)
Eosinophils Relative: 4 %
HCT: 29.3 % — ABNORMAL LOW (ref 39.0–52.0)
Hemoglobin: 8.5 g/dL — ABNORMAL LOW (ref 13.0–17.0)
IMMATURE GRANULOCYTES: 1 %
LYMPHS ABS: 0.2 10*3/uL — AB (ref 0.7–4.0)
Lymphocytes Relative: 3 %
MCH: 26.5 pg (ref 26.0–34.0)
MCHC: 29 g/dL — ABNORMAL LOW (ref 30.0–36.0)
MCV: 91.3 fL (ref 78.0–100.0)
MONOS PCT: 5 %
Monocytes Absolute: 0.4 10*3/uL (ref 0.1–1.0)
NEUTROS ABS: 6.1 10*3/uL (ref 1.7–7.7)
NEUTROS PCT: 87 %
PLATELETS: 98 10*3/uL — AB (ref 150–400)
RBC: 3.21 MIL/uL — AB (ref 4.22–5.81)
RDW: 17.4 % — ABNORMAL HIGH (ref 11.5–15.5)
WBC: 7 10*3/uL (ref 4.0–10.5)

## 2017-07-12 LAB — POCT I-STAT 3, ART BLOOD GAS (G3+)
ACID-BASE DEFICIT: 4 mmol/L — AB (ref 0.0–2.0)
Bicarbonate: 22.1 mmol/L (ref 20.0–28.0)
O2 SAT: 96 %
PCO2 ART: 42.9 mmHg (ref 32.0–48.0)
PH ART: 7.319 — AB (ref 7.350–7.450)
PO2 ART: 85 mmHg (ref 83.0–108.0)
Patient temperature: 97.8
TCO2: 23 mmol/L (ref 22–32)

## 2017-07-12 LAB — BASIC METABOLIC PANEL
Anion gap: 9 (ref 5–15)
BUN: 85 mg/dL — AB (ref 6–20)
CHLORIDE: 113 mmol/L — AB (ref 101–111)
CO2: 25 mmol/L (ref 22–32)
CREATININE: 2.75 mg/dL — AB (ref 0.61–1.24)
Calcium: 8.1 mg/dL — ABNORMAL LOW (ref 8.9–10.3)
GFR calc Af Amer: 26 mL/min — ABNORMAL LOW (ref >60)
GFR calc non Af Amer: 22 mL/min — ABNORMAL LOW (ref >60)
GLUCOSE: 264 mg/dL — AB (ref 65–99)
POTASSIUM: 3.9 mmol/L (ref 3.5–5.1)
SODIUM: 147 mmol/L — AB (ref 135–145)

## 2017-07-12 LAB — CULTURE, BLOOD (ROUTINE X 2)
CULTURE: NO GROWTH
Culture: NO GROWTH

## 2017-07-12 LAB — GLUCOSE, CAPILLARY
GLUCOSE-CAPILLARY: 230 mg/dL — AB (ref 65–99)
GLUCOSE-CAPILLARY: 245 mg/dL — AB (ref 65–99)
Glucose-Capillary: 201 mg/dL — ABNORMAL HIGH (ref 65–99)
Glucose-Capillary: 238 mg/dL — ABNORMAL HIGH (ref 65–99)
Glucose-Capillary: 254 mg/dL — ABNORMAL HIGH (ref 65–99)

## 2017-07-12 LAB — SEDIMENTATION RATE: Sed Rate: 98 mm/hr — ABNORMAL HIGH (ref 0–16)

## 2017-07-12 MED ORDER — INSULIN ASPART 100 UNIT/ML ~~LOC~~ SOLN
0.0000 [IU] | Freq: Three times a day (TID) | SUBCUTANEOUS | Status: DC
Start: 2017-07-12 — End: 2017-07-12

## 2017-07-12 MED ORDER — ORAL CARE MOUTH RINSE
15.0000 mL | OROMUCOSAL | Status: DC
  Administered 2017-07-12 – 2017-07-16 (×37): 15 mL via OROMUCOSAL

## 2017-07-12 MED ORDER — CHLORHEXIDINE GLUCONATE 0.12% ORAL RINSE (MEDLINE KIT)
15.0000 mL | Freq: Two times a day (BID) | OROMUCOSAL | Status: DC
  Administered 2017-07-12 – 2017-07-15 (×8): 15 mL via OROMUCOSAL

## 2017-07-12 MED ORDER — INSULIN ASPART 100 UNIT/ML ~~LOC~~ SOLN
0.0000 [IU] | Freq: Four times a day (QID) | SUBCUTANEOUS | Status: DC
  Administered 2017-07-12: 8 [IU] via SUBCUTANEOUS
  Administered 2017-07-12 – 2017-07-13 (×5): 5 [IU] via SUBCUTANEOUS
  Administered 2017-07-14: 8 [IU] via SUBCUTANEOUS
  Administered 2017-07-14: 5 [IU] via SUBCUTANEOUS

## 2017-07-12 MED ORDER — INSULIN GLARGINE 100 UNIT/ML ~~LOC~~ SOLN
20.0000 [IU] | Freq: Every day | SUBCUTANEOUS | Status: DC
  Administered 2017-07-13 – 2017-07-14 (×2): 20 [IU] via SUBCUTANEOUS
  Filled 2017-07-12 (×2): qty 0.2

## 2017-07-12 MED ORDER — AZITHROMYCIN 200 MG/5ML PO SUSR
250.0000 mg | Freq: Every day | ORAL | Status: DC
  Filled 2017-07-12: qty 10

## 2017-07-12 NOTE — Progress Notes (Signed)
[facility]  [name] MRN: [id number] DOB: 10/14/1950    ADMISSION DATE:  07/05/2017 CONSULTATION DATE:  07/07/2017  REFERRING MD:  Guadalupe Dawn, MD  REASON FOR CONSULTATION:  Respiratory distress  HISTORY OF PRESENT ILLNESS  This 66 y.o. African American male is seen in consultation at the request of Dr. Guadalupe Dawn for recommendations on further evaluation and management of respiratory distress. The patient was admitted on 07/05/2017 with a clinical impression of CHF exacerbation. He has been BiPAP dependent for about 36 hours or so. Rapid response team was called earlier this evening for increased work of breathing with use of accessory muscles, tachypnea (40s) and increasing FiO2 requirements. The patient was treated with adjustment of BiPAP settings and an additional furosemide dose (40 mg). At the time of clinical interview, the patient remains on BiPAP 12/8, FiO2 50%. RR is 28-30.  SPO2 90-91%.   Interval Hx/subjective  6/2: admitted with increased dyspnea and started on diuresis and BiPAP support for CHF exacerbation. 6/4: rapid response called for increased work of breathing. Remains BiPAP dependent. Transfer to ICU for closer monitoring. May require intubation. 07/07/2017 0800 hrs. tolerate noninvasive mechanical ventilatory support FiO2 is now down to 80% from 100 his pulmonary status remains tenuous 6/5 intubated due to worsening hypoxemia and respiratory distress despite BIPAP. He was transfused with 1 U PRBC 6/5 before intubation and CVC insertion  6/6 transfused again. Required escalating [name]/FIO2. dyssynchronous w/ vent, eventually placed on IV NMB infusion 6/7: better over night. Coming back down off high [name] and FIO2. On-going anemia w/out evidence of bleeding.  He is now off of Nimbex but intermittently desaturating and still requiring a high FiO2 on 10 of [name].  He continues to have some dark bloody secretions.  He was sedated with  combination of fentanyl and propofol during my examination.  VITAL SIGNS: BP (!) 142/55   Pulse 66   Temp 97.6 F (36.4 C) (Oral)   Resp 15   Ht 5\' 8"  (1.727 m)   Wt 203 lb 7.8 oz (92.3 kg)   SpO2 96%   BMI 30.94 kg/m   VENTILATOR SETTINGS: Vent Mode: PRVC FiO2 (%):  [60 %-100 %] 70 % Set Rate:  [14 bmp] 14 bmp Vt Set:  [550 mL] 550 mL [name]:  [8 cmH20-10 cmH20] 10 cmH20 Plateau Pressure:  [24 HBZ16-96 cmH20] 25 cmH20  INTAKE / OUTPUT:  Intake/Output Summary (Last 24 hours) at 07/12/2017 1352 Last data filed at 07/12/2017 1200 Gross per 24 hour  Intake 4186.05 ml  Output 2072 ml  Net 2114.05 ml   PHYSICAL EXAMINATION: General: This is a thin elderly male who is orally intubated mechanically ventilated.  He is sedated and not interactive    HEENT normocephalic atraumatic no jugular venous distention orally intubated Pulmonary: Abrasions are unlabored, he is not breathing above the set ventilator rate despite a respiratory acidosis.  There is symmetric air movement scattered rhonchi and no wheezes .   Cardiac: S1 and S2 are regular and somewhat distant without murmur rub or gallop.  He does not have JVD or edema at present    Abdomen: The abdomen is soft without any overt organomegaly masses or tenderness Etremities: Left AKA.  Right lower extremity warm and dry no significant edema Neuro: He was on only 15 and propofol and a modest dose of fentanyl at the time of my examination.  He only partially eye open to noxious stimuli and move the right upper extremity purposefully  I could not get him to withdraw the left.  Nursing tells me that when his sedation is held he does move both upper extremities but loses ventilator synchrony GU: Clear yellow  LABS:  ABG Recent Labs  Lab 07/08/17 1121 07/11/17 0908 07/12/17 0510  PHART 7.304* 7.357 7.279*  PCO2ART 42.7 38.7 51.0*  PO2ART 110.0* 66.0* 72.9*    Cardiac Enzymes Lab Results  Component Value Date   TROPIPOC 0.02 07/05/2017    Lab Results  Component Value Date   BNP 175.3 (H) 07/09/2017   BNP 464.2 (H) 07/07/2017   BNP 595.9 (H) 22/29/7989    BASIC METABOLIC PROFILE Recent Labs  Lab 07/10/17 0409 07/10/17 1117 07/11/17 0428 07/11/17 1615 07/12/17 0440  NA 151*  --  150*  --  147*  K 3.8  --  3.6  --  3.9  CL 118*  --  117*  --  113*  CO2 25  --  23  --  25  BUN 89*  --  85*  --  85*  CREATININE 2.66*  --  2.91*  --  2.75*  GLUCOSE 114*  --  130*  --  264*  CALCIUM 7.8*  --  7.8*  --  8.1*  MG 2.6* 2.3 2.3 2.4  --   PHOS 4.2 4.1 4.3 5.0*  --     Glucose Recent Labs  Lab 07/11/17 1617 07/11/17 1925 07/11/17 2359 07/12/17 0439 07/12/17 0812 07/12/17 1214  GLUCAP 147* 168* 201* 230* 245* 238*    CBC Recent Labs  Lab 07/09/17 0210 07/10/17 0409 07/10/17 1117 07/12/17 0440  WBC 5.4 3.9*  --  7.0  HGB 6.3* 6.5* 7.7* 8.5*  HCT 21.1* 21.3* 25.1* 29.3*  PLT 84* 88*  --  98*    COAGULATION STUDIES Recent Labs  Lab 07/08/17 0256 07/09/17 0210 07/10/17 0409  APTT 99* 85* 74*      IMAGING: No results found.  SIGNIFICANT EVENTS:  CULTURES: Blood cultures 6/4>>> Respiratory cultures 6/5>>> RVP 6/6>>> u strep 6/7>>> u legionella 6/7>>>  abx vanc 6/5>>> Zosyn 6/5>>> azith 6/7>>>  ASSESSMENT / PLAN:   ACUTE HYPOXEMIC RESPIRATORY FAILURE in setting of edema, c/b aspiration PNA and ARDS.  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  Clearly he has diastolic dysfunction and initial films suggested CHF however chest x-ray subsequently showed a left lower lobe process and a CT scan which was a poor quality due to motion artifact suggested that we had dependent infiltrates in addition to CHF.  We have isolated nothing from cultures and his sputum pathogen PCR was negative  Plan I am going to continue his Zosyn and empiric azithromycin for potential aspiration and atypicals not detected by culture.  I have  resumed fairly aggressive diuresis today.  Acute on chronic diastolic HF w/ pulmonary edema  CHF EXACERBATION Bradycardia -2D echo demonstrates grade 2 diastolic dysfunction Plan Cont lasix Continue his Coreg.   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  In addition his platelet count dropped from approximately 190 in admission to roughly 100 the following day.  I have sent a HIT antibody and suspended his heparin starting today, 6/9 Plan Trend CBC Transfuse per protocol  Ck FOB Ck LDH  Remote DVT august 2018 Plan Limited Doppler of the lower extremities shows no persistent DVT   Mild Thrombocytopenia  See above     TYPE 2 DIABETES MELLITUS with NEPHROPATHY, NEUROPATHY, VASCULAR DISEASE Plan ssi     DVT prophylaxis: SCDs only   SUP: pepcid Diet: start tubefeeds 6/7 Activity: BR Disposition : ICU  Lars Masson, MD Critical Care Medicine

## 2017-07-12 NOTE — Plan of Care (Signed)
  Problem: Nutrition: Goal: Adequate nutrition will be maintained Outcome: Progressing   Problem: Elimination: Goal: Will not experience complications related to bowel motility Outcome: Progressing   Problem: Pain Managment: Goal: General experience of comfort will improve Outcome: Progressing   Problem: Clinical Measurements: Goal: Respiratory complications will improve Outcome: Not Progressing   Problem: Activity: Goal: Risk for activity intolerance will decrease Outcome: Not Progressing   Problem: Elimination: Goal: Will not experience complications related to urinary retention Outcome: Not Progressing

## 2017-07-12 NOTE — Progress Notes (Signed)
Appreciate excellent care provided by PCCM.  FM following along socially and will resume care when pt stable for floor.   [place] PGY-2

## 2017-07-12 NOTE — Progress Notes (Signed)
[name] increased from [name] to [name] due to pt continuing to have episodes of desaturation. Dr. Jimmy Footman notified of vent change. ABG scheduled for am. RT will continue to monitor.

## 2017-07-13 ENCOUNTER — Inpatient Hospital Stay (HOSPITAL_COMMUNITY): Payer: Medicare Other

## 2017-07-13 LAB — GLUCOSE, CAPILLARY
GLUCOSE-CAPILLARY: 240 mg/dL — AB (ref 65–99)
Glucose-Capillary: 209 mg/dL — ABNORMAL HIGH (ref 65–99)
Glucose-Capillary: 214 mg/dL — ABNORMAL HIGH (ref 65–99)
Glucose-Capillary: 228 mg/dL — ABNORMAL HIGH (ref 65–99)
Glucose-Capillary: 249 mg/dL — ABNORMAL HIGH (ref 65–99)

## 2017-07-13 LAB — CBC WITH DIFFERENTIAL/PLATELET
Abs Immature Granulocytes: 0 10*3/uL (ref 0.0–0.1)
Basophils Absolute: 0 10*3/uL (ref 0.0–0.1)
Basophils Relative: 0 %
Eosinophils Absolute: 0.2 10*3/uL (ref 0.0–0.7)
Eosinophils Relative: 3 %
HEMATOCRIT: 26.5 % — AB (ref 39.0–52.0)
Hemoglobin: 7.9 g/dL — ABNORMAL LOW (ref 13.0–17.0)
IMMATURE GRANULOCYTES: 1 %
LYMPHS ABS: 0.4 10*3/uL — AB (ref 0.7–4.0)
Lymphocytes Relative: 6 %
MCH: 26.6 pg (ref 26.0–34.0)
MCHC: 29.8 g/dL — ABNORMAL LOW (ref 30.0–36.0)
MCV: 89.2 fL (ref 78.0–100.0)
MONOS PCT: 6 %
Monocytes Absolute: 0.4 10*3/uL (ref 0.1–1.0)
NEUTROS PCT: 84 %
Neutro Abs: 5.4 10*3/uL (ref 1.7–7.7)
Platelets: 93 10*3/uL — ABNORMAL LOW (ref 150–400)
RBC: 2.97 MIL/uL — ABNORMAL LOW (ref 4.22–5.81)
RDW: 16.9 % — AB (ref 11.5–15.5)
WBC: 6.4 10*3/uL (ref 4.0–10.5)

## 2017-07-13 LAB — PROTEIN ELECTROPHORESIS, SERUM
A/G Ratio: 1 (ref 0.7–1.7)
ALBUMIN ELP: 2.3 g/dL — AB (ref 2.9–4.4)
Alpha-1-Globulin: 0.3 g/dL (ref 0.0–0.4)
Alpha-2-Globulin: 0.6 g/dL (ref 0.4–1.0)
Beta Globulin: 0.8 g/dL (ref 0.7–1.3)
GLOBULIN, TOTAL: 2.2 g/dL (ref 2.2–3.9)
Gamma Globulin: 0.4 g/dL (ref 0.4–1.8)
TOTAL PROTEIN ELP: 4.5 g/dL — AB (ref 6.0–8.5)

## 2017-07-13 LAB — BLOOD GAS, ARTERIAL
ACID-BASE DEFICIT: 2.6 mmol/L — AB (ref 0.0–2.0)
ACID-BASE DEFICIT: 2.6 mmol/L — AB (ref 0.0–2.0)
BICARBONATE: 23.3 mmol/L (ref 20.0–28.0)
BICARBONATE: 22.6 mmol/L (ref 20.0–28.0)
DRAWN BY: 330991
Drawn by: 330991
FIO2: 100
FIO2: 50
LHR: 14 {breaths}/min
LHR: 16 {breaths}/min
O2 SAT: 94.4 %
O2 SAT: 92.9 %
PEEP/CPAP: 10 cmH2O
PEEP/CPAP: 10 cmH2O
PH ART: 7.279 — AB (ref 7.350–7.450)
Patient temperature: 97.8
Patient temperature: 97.5
VT: 550 mL
VT: 550 mL
pCO2 arterial: 51 mmHg — ABNORMAL HIGH (ref 32.0–48.0)
pCO2 arterial: 43.5 mmHg (ref 32.0–48.0)
pH, Arterial: 7.331 — ABNORMAL LOW (ref 7.350–7.450)
pO2, Arterial: 72.9 mmHg — ABNORMAL LOW (ref 83.0–108.0)
pO2, Arterial: 65 mmHg — ABNORMAL LOW (ref 83.0–108.0)

## 2017-07-13 LAB — CULTURE, BLOOD (ROUTINE X 2)
CULTURE: NO GROWTH
Culture: NO GROWTH
SPECIAL REQUESTS: ADEQUATE
Special Requests: ADEQUATE

## 2017-07-13 LAB — BASIC METABOLIC PANEL
ANION GAP: 8 (ref 5–15)
BUN: 86 mg/dL — ABNORMAL HIGH (ref 6–20)
CALCIUM: 7.9 mg/dL — AB (ref 8.9–10.3)
CO2: 23 mmol/L (ref 22–32)
Chloride: 112 mmol/L — ABNORMAL HIGH (ref 101–111)
Creatinine, Ser: 2.77 mg/dL — ABNORMAL HIGH (ref 0.61–1.24)
GFR calc Af Amer: 26 mL/min — ABNORMAL LOW (ref >60)
GFR calc non Af Amer: 22 mL/min — ABNORMAL LOW (ref >60)
GLUCOSE: 237 mg/dL — AB (ref 65–99)
POTASSIUM: 3.6 mmol/L (ref 3.5–5.1)
Sodium: 143 mmol/L (ref 135–145)

## 2017-07-13 MED ORDER — VITAL AF 1.2 CAL PO LIQD
1000.0000 mL | ORAL | Status: DC
  Administered 2017-07-13 – 2017-07-15 (×2): 1000 mL

## 2017-07-13 MED ORDER — FENTANYL CITRATE (PF) 100 MCG/2ML IJ SOLN
25.0000 ug | INTRAMUSCULAR | Status: DC | PRN
  Administered 2017-07-14: 25 ug via INTRAVENOUS
  Filled 2017-07-13: qty 2

## 2017-07-13 MED ORDER — PRO-STAT SUGAR FREE PO LIQD
30.0000 mL | Freq: Three times a day (TID) | ORAL | Status: DC
  Administered 2017-07-13 – 2017-07-15 (×8): 30 mL
  Filled 2017-07-13 (×8): qty 30

## 2017-07-13 MED ORDER — FUROSEMIDE 10 MG/ML IJ SOLN
100.0000 mg | Freq: Once | INTRAVENOUS | Status: AC
  Administered 2017-07-13: 100 mg via INTRAVENOUS
  Filled 2017-07-13: qty 10

## 2017-07-13 NOTE — Progress Notes (Addendum)
Nutrition Follow-up  DOCUMENTATION CODES:   Not applicable  INTERVENTION:  Vital 1.2 @ 50 ml/hr (1200 ml/24 hrs) + Prostat TID. Total regimen providing 1740 kcal, 135 g protein, and 972 ml H2O; meeting 98% of kcal and 100% protein needs.  NUTRITION DIAGNOSIS:   Inadequate oral intake related to inability to eat as evidenced by NPO status.  Ongoing  GOAL:   Patient will meet greater than or equal to 90% of their needs  Ongoing - met with TF  MONITOR:   Vent status, Skin, TF tolerance, Weight trends, Labs, I & O's  REASON FOR ASSESSMENT:   Ventilator    ASSESSMENT:   67 y.o. M admitted on 07/05/17 for acute respiratory failure with hypoxia and CHF exacerbation. PMH of HTN, PAD, CHF, CAD, hx of skin cancer, T2DM, hx of gangrene of toe 2014, CKD stg IV, and anemia. 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 Pt intubate 6/5.   Pt off propofol.   MVe: 8.8 Temp (24hrs), Avg:97.7 F (36.5 C), Min:97.5 F (36.4 C), Max:98.2 F (36.8 C)  Medications reviewed: zythromax, coreg, catapres, cardura, PS BID, vital HP @ 25 ml/hr - not given yet (restarted 6/9), ferrous sulfate, 100 ml FW Q4H, apresoline, novolog 0-15 units, lantus, levothyroxine, D5 @ 70 ml/hr, pepcid, fentanyl, zosyn, propofol (stopped 0745 6/10).  Labs reviewed: CL 112 (H), BG 237 (H), BUN 86 (H), creatinine 2.77 (H), GFR 22 (L), RBC 2.97 (L), hemoglobin 7.9 (L), HCT 26.5 (L), platelets 93 (L).   Diet Order:   Diet Order           Diet NPO time specified Except for: Sips with Meds  Diet effective now          EDUCATION NEEDS:   Not appropriate for education at this time  Skin:  Skin Assessment: Reviewed RN Assessment  Last BM:  07/13/17 (200 ml rectal tube)  Height:   Ht Readings from Last 1 Encounters:  07/05/17 '5\' 8"'  (1.727 m)    Weight:   Wt Readings from Last 1 Encounters:  07/13/17 205 lb 11 oz (93.3 kg)    Ideal Body Weight:  64.4 kg  BMI:  Body mass index is 31.27 kg/m.  Estimated Nutritional  Needs:   Kcal:  1769 kcal (PS eq: MSJ 1628, MVe 8.8, tmax 36.8)  Protein:  130-145 grams  Fluid:  >/= 1.75 L or per MD    Hope Budds, Dietetic Intern

## 2017-07-13 NOTE — Progress Notes (Signed)
Follow up - Critical Care Medicine Note  Patient Details:    Leonard [name] is an 67 y.o. male.  He was originally admitted for acute hypoxic respiratory failure secondary to acute decompensated diastolic heart failure.  He continued to worsen despite BiPAP.  He was eventually intubated and required significant levels of [name] to improve oxygenation and neuromuscular blockade to improve ventilator synchrony.  Lines, Airways, Drains: Airway 8 mm (Active)  Secured at (cm) 26 cm 07/13/2017 12:00 PM  Measured From Lips 07/13/2017 12:00 PM  Secured Location Right 07/13/2017 12:00 PM  Secured By [employer] 07/13/2017 11:15 AM  Tube Holder Repositioned Yes 07/13/2017 11:15 AM  Cuff Pressure (cm H2O) 26 cm H2O 07/13/2017  8:01 AM  Site Condition Dry 07/13/2017 11:15 AM     CVC Triple Lumen 07/08/17 Right Femoral (Active)  Indication for Insertion or Continuance of Line Vasoactive infusions 07/13/2017  8:00 AM  Site Assessment Clean;Dry;Intact 07/13/2017  8:00 AM  Proximal Lumen Status Infusing 07/13/2017  8:00 AM  Medial Lumen Status Infusing 07/13/2017  8:00 AM  Distal Lumen Status Infusing 07/13/2017  8:00 AM  Dressing Type Transparent;Occlusive 07/13/2017  8:00 AM  Dressing Status Clean;Dry;Intact;Antimicrobial disc in place 07/13/2017  8:00 AM  Line Care Connections checked and tightened 07/13/2017  8:00 AM  Dressing Intervention Dressing changed;Antimicrobial disc changed 07/08/2017  4:00 PM  Dressing Change Due 07/15/17 07/13/2017  8:00 AM     Rectal Tube/Pouch (Active)  Output (mL) 200 mL 07/13/2017  5:35 AM  Intake (mL) 100 mL 07/11/2017  9:00 PM     Urethral Catheter [place], RN Straight-tip 16 Fr. (Active)  Indication for Insertion or Continuance of Catheter Other (comment) 07/13/2017  7:29 AM  Site Assessment Clean;Intact 07/12/2017  8:00 PM  Catheter Maintenance Bag below level of bladder;Catheter secured;Drainage bag/tubing not touching floor;Seal intact;No dependent loops;Insertion date  on drainage bag 07/13/2017  8:00 AM  Collection Container Standard drainage bag 07/12/2017  8:00 PM  Securement Method Securing device (Describe) 07/12/2017  8:00 PM  Urinary Catheter Interventions Unclamped 07/12/2017  8:00 PM  Output (mL) 150 mL 07/13/2017  2:00 PM    Anti-infectives:  Anti-infectives (From admission, onward)   Start     Dose/Rate Route Frequency Ordered Stop   07/13/17 1000  azithromycin (ZITHROMAX) 200 MG/5ML suspension 250 mg  Status:  Discontinued     250 mg Per Tube Daily 07/12/17 1337 07/13/17 1011   07/10/17 1015  azithromycin (ZITHROMAX) 500 mg in sodium chloride 0.9 % 250 mL IVPB     500 mg 250 mL/hr over 60 Minutes Intravenous Every 24 hours 07/10/17 1014 07/12/17 1038   07/09/17 0930  vancomycin (VANCOCIN) IVPB 750 mg/150 ml premix  Status:  Discontinued     750 mg 150 mL/hr over 60 Minutes Intravenous Every 24 hours 07/08/17 0906 07/11/17 0837   07/08/17 1500  piperacillin-tazobactam (ZOSYN) IVPB 3.375 g  Status:  Discontinued     3.375 g 12.5 mL/hr over 240 Minutes Intravenous Every 8 hours 07/08/17 0906 07/13/17 1011   07/08/17 0930  vancomycin (VANCOCIN) IVPB 1000 mg/200 mL premix     1,000 mg 200 mL/hr over 60 Minutes Intravenous  Once 07/08/17 0906 07/08/17 1312   07/08/17 0930  piperacillin-tazobactam (ZOSYN) IVPB 3.375 g  Status:  Discontinued     3.375 g 100 mL/hr over 30 Minutes Intravenous  Once 07/08/17 0906 07/10/17 1051      Microbiology: Results for orders placed or performed during the hospital encounter of 07/05/17  MRSA PCR Screening     Status: None   Collection Time: 07/07/17  5:45 AM  Result Value Ref Range Status   MRSA by PCR NEGATIVE NEGATIVE Final    Comment:        The GeneXpert MRSA Assay (FDA approved for NASAL specimens only), is one component of a comprehensive MRSA colonization surveillance program. It is not intended to diagnose MRSA infection nor to guide or monitor treatment for MRSA infections. Performed at [facility], [place] [address], [place], [place] [address]   Culture, blood (routine x 2)     Status: None   Collection Time: 07/07/17  7:57 AM  Result Value Ref Range Status   Specimen Description BLOOD RIGHT HAND PINKY SIDE  Final   Special Requests   Final    BOTTLES DRAWN AEROBIC ONLY Blood Culture results may not be optimal due to an inadequate volume of blood received in culture bottles   Culture   Final    NO GROWTH 5 DAYS Performed at [facility], [place] [address], [place], [place] [address]    Report Status 07/12/2017 FINAL  Final  Culture, blood (routine x 2)     Status: None   Collection Time: 07/07/17  8:05 AM  Result Value Ref Range Status   Specimen Description BLOOD RIGHT HAND THUMB SIDE  Final   Special Requests   Final    BOTTLES DRAWN AEROBIC ONLY Blood Culture results may not be optimal due to an inadequate volume of blood received in culture bottles   Culture   Final    NO GROWTH 5 DAYS Performed at [facility], [place] [address], [place], [place] [address]    Report Status 07/12/2017 FINAL  Final  Culture, respiratory (NON-Expectorated)     Status: None   Collection Time: 07/08/17 11:12 AM  Result Value Ref Range Status   Specimen Description TRACHEAL ASPIRATE  Final   Special Requests NONE  Final   Gram Stain   Final    FEW WBC PRESENT, PREDOMINANTLY PMN NO ORGANISMS SEEN    Culture   Final    Consistent with normal respiratory flora. Performed at [facility], [place] [address], [place], [place] [address]    Report Status 07/10/2017 FINAL  Final  Culture, blood (routine x 2)     Status: None   Collection Time: 07/08/17 11:18 AM  Result Value Ref Range Status   Specimen Description BLOOD RIGHT HAND  Final   Special Requests   Final    BOTTLES DRAWN AEROBIC ONLY Blood Culture adequate volume   Culture   Final    NO GROWTH 5 DAYS Performed at [name], [place] [address] [place], [place] [address]    Report Status 07/13/2017 FINAL   Final  Culture, blood (routine x 2)     Status: None   Collection Time: 07/08/17 11:28 AM  Result Value Ref Range Status   Specimen Description BLOOD RIGHT HAND  Final   Special Requests   Final    BOTTLES DRAWN AEROBIC ONLY Blood Culture adequate volume   Culture   Final    NO GROWTH 5 DAYS Performed at [facility], [place] [address], [place], [place] [address]    Report Status 07/13/2017 FINAL  Final  Respiratory Panel by PCR     Status: None   Collection Time: 07/10/17 11:17 AM  Result Value Ref Range Status   Adenovirus NOT DETECTED NOT  DETECTED Final   Coronavirus 229E NOT DETECTED NOT DETECTED Final   Coronavirus HKU1 NOT DETECTED NOT DETECTED Final   Coronavirus NL63 NOT DETECTED NOT DETECTED Final   Coronavirus OC43 NOT DETECTED NOT DETECTED Final   Metapneumovirus NOT DETECTED NOT DETECTED Final   Rhinovirus / Enterovirus NOT DETECTED NOT DETECTED Final   Influenza A NOT DETECTED NOT DETECTED Final   Influenza B NOT DETECTED NOT DETECTED Final   Parainfluenza Virus 1 NOT DETECTED NOT DETECTED Final   Parainfluenza Virus 2 NOT DETECTED NOT DETECTED Final   Parainfluenza Virus 3 NOT DETECTED NOT DETECTED Final   Parainfluenza Virus 4 NOT DETECTED NOT DETECTED Final   Respiratory Syncytial Virus NOT DETECTED NOT DETECTED Final   Bordetella pertussis NOT DETECTED NOT DETECTED Final   Chlamydophila pneumoniae NOT DETECTED NOT DETECTED Final   Mycoplasma pneumoniae NOT DETECTED NOT DETECTED Final    Comment: Performed at [facility], [place] [address], [place], [place] [address]   *Note: Due to a large number of results and/or encounters for the requested time period, some results have not been displayed. A complete set of results can be found in Results Review.    Best Practice/Protocols:  VTE Prophylaxis: Heparin (drip) Which has been stopped due to bleeding. ARDS  Events:  With initiation of diuresis, oxygenation has improved.  Studies: Dg Chest 1  View  Result Date: 07/08/2017 CLINICAL DATA:  Assess positioning of the orogastric tube. EXAM: CHEST  1 VIEW COMPARISON:  Portable chest x-ray of earlier today FINDINGS: The orogastric tubes proximal port lies at or just below the GE junction with the tip in the gastric cardia. The endotracheal tube lies approximately 4 cm above the carina. There is confluent airspace opacity in the left lung and to a lesser extent on the right. IMPRESSION: Somewhat high positioning of the orogastric tube. Advancement by 5-10 cm would assure that the proximal port is positioned below the GE junction. Electronically Signed   By: David  Martinique M.D.   On: 07/08/2017 11:21   Ct Chest Wo Contrast  Result Date: 07/11/2017 CLINICAL DATA:  Respiratory decline, acute respiratory illness, intubated EXAM: CT CHEST WITHOUT CONTRAST TECHNIQUE: Multidetector CT imaging of the chest was performed following the standard protocol without IV contrast. COMPARISON:  07/11/2017 FINDINGS: Cardiovascular: Mild cardiomegaly. No pericardial effusion. Postop changes of coronary bypass. Extensive atherosclerosis of the major branch vessels, thoracic aorta and native coronary vasculature. Exam is limited without contrast. Mediastinum/Nodes: Feeding tube within the esophagus extending into the stomach. Tube is coiled in the stomach. Prominent scattered mediastinal and suspect hilar lymph nodes likely reactive. Limited assessment without contrast. Lungs/Pleura: Respiratory motion artifact noted. Vascular congestion and central interstitial edema pattern noted. Dense consolidation/collapse of both lower lobes but also airspace disease and consolidation in the dependent upper lobes, lingula and right middle lobe inferiorly. Pneumonia and/or aspiration could have this appearance. Pleural effusions noted bilaterally, larger on the right. Upper Abdomen: Limited without contrast. Splenic granulomata noted. Feeding tube coiled in the stomach. Musculoskeletal: No  acute osseous finding. IMPRESSION: Extensive bilateral airspace opacities dependently in the upper lobes but also in the lingula and right middle lobe. Dense bibasilar collapse/consolidation. Pneumonia and/or aspiration is favored with a component of mild superimposed interstitial edema centrally. Associated pleural effusions, larger on the right Mild cardiomegaly without pericardial effusion Limited without IV contrast Aortic Atherosclerosis (ICD10-I70.0). Electronically Signed   By: Jerilynn Mages.  Shick M.D.   On: 07/11/2017 11:16   Dg Chest [facility]  Result Date: 07/13/2017 CLINICAL DATA:  CHF EXAM: PORTABLE CHEST 1 VIEW COMPARISON:  07/11/2017 FINDINGS: Support devices are stable. Cardiomegaly with vascular congestion and interstitial prominence, likely interstitial edema. No visible effusions or acute bony abnormality. IMPRESSION: Continued interstitial edema/CHF, slightly improved since prior study. Electronically Signed   By: Rolm Baptise M.D.   On: 07/13/2017 08:51   Dg Chest Port 1 View  Result Date: 07/11/2017 CLINICAL DATA:  Pneumonia EXAM: PORTABLE CHEST 1 VIEW COMPARISON:  Yesterday FINDINGS: Endotracheal tube tip 2 cm above the carina. A feeding tube reaches the stomach at least. Bilateral interstitial and airspace opacity with interval increase. No definite pleural fluid. No pneumothorax. Stable postoperative heart size. There are changes of CABG. IMPRESSION: 1. Worsening bilateral lung opacity with symmetry favoring edema over infection. 2. Lower endotracheal tube, tip 2 cm above the carina. Electronically Signed   By: Monte Fantasia M.D.   On: 07/11/2017 08:07   Dg Chest Port 1 View  Result Date: 07/10/2017 CLINICAL DATA:  Respiratory failure EXAM: PORTABLE CHEST 1 VIEW COMPARISON:  07/09/2017 FINDINGS: Endotracheal tube and NG tube are unchanged. Bilateral airspace opacities, most confluent in the left mid and lower lung, stable since prior study. Heart is upper limits normal in size. No visible  effusions. IMPRESSION: Bilateral asymmetric airspace disease, left greater than right could reflect asymmetric edema or infection. No real change. Electronically Signed   By: Rolm Baptise M.D.   On: 07/10/2017 09:02   Dg Chest Port 1 View  Result Date: 07/09/2017 CLINICAL DATA:  67 year old male with history of shortness of breath. EXAM: PORTABLE CHEST 1 VIEW COMPARISON:  Chest x-ray 07/09/2017. FINDINGS: An endotracheal tube is in place with tip 3.2 cm above the carina. A nasogastric tube is seen extending into the stomach, however, the tip of the nasogastric tube extends below the lower margin of the image. There is cephalization of the pulmonary vasculature, indistinctness of the interstitial markings, and patchy airspace disease throughout the lungs bilaterally suggestive of moderate pulmonary edema. Trace left pleural effusion. Heart size is mildly enlarged. Upper mediastinal contours are distorted by patient's rotation to the right. Aortic atherosclerosis. Status post median sternotomy for CABG. IMPRESSION: 1. The appearance of the chest suggests worsening congestive heart failure, as above. 2. Aortic atherosclerosis. Electronically Signed   By: Vinnie Langton M.D.   On: 07/09/2017 12:44   Dg Chest Port 1 View  Result Date: 07/09/2017 CLINICAL DATA:  Check gastric catheter placement EXAM: PORTABLE CHEST 1 VIEW COMPARISON:  Film from earlier in the same day. FINDINGS: Cardiac shadow is stable. Postsurgical changes are again seen. The patchy infiltrates identified in both lungs are again noted and stable. Nasogastric catheter is again seen. There is again a loop identified in the hypopharynx. The previously seen kink is been reduced although some redundant catheter is present. Endotracheal tube is again noted. IMPRESSION: Redundancy of the gastric catheter within the hypopharynx. The previously seen kink has been reduced although continued withdrawal is recommended. Electronically Signed   By: [name] M.D.   On: 07/09/2017 10:14   Dg Chest Port 1 View  Result Date: 07/09/2017 CLINICAL DATA:  Respiratory failure EXAM: PORTABLE CHEST 1 VIEW COMPARISON:  07/08/2017 FINDINGS: Cardiac shadow is mildly enlarged but stable. Postsurgical changes are again seen. Endotracheal tube and nasogastric catheter are noted. The nasogastric catheter is looped within the hypopharynx with some kinking new from the prior exam. The proximal side port lies at the gastroesophageal junction. Vascular congestion and mild interstitial  edema is noted although the overall appearance is improved when compared with the prior study. No sizable effusion is noted. IMPRESSION: Persistent but improving opacities bilaterally. Tubes and lines as described. A nasogastric catheter appears kinked in the hypopharynx due to redundancy. Electronically Signed   By: [name] M.D.   On: 07/09/2017 09:04   Dg Chest Port 1 View  Result Date: 07/08/2017 CLINICAL DATA:  Acute respiratory failure. EXAM: PORTABLE CHEST 1 VIEW COMPARISON:  07/07/2017 FINDINGS: Heart size is stable.  Prior CABG again noted. Low lung volumes are again demonstrated. Diffuse bilateral pulmonary airspace disease shows no significant change. IMPRESSION: Diffuse bilateral pulmonary airspace disease, without significant change. Electronically Signed   By: Earle Gell M.D.   On: 07/08/2017 09:33   Dg Chest Port 1 View  Result Date: 07/07/2017 CLINICAL DATA:  Increasing shortness of breath, respiratory distress. EXAM: PORTABLE CHEST 1 VIEW COMPARISON:  Chest radiograph July 05, 2017 FINDINGS: Cardiac silhouette is mildly enlarged. Calcified aortic knob. Status post median sternotomy. Worsening interstitial and alveolar airspace opacities with small to moderate right pleural effusion, left costophrenic angle out of field of view. No pneumothorax. Subclavian vascular calcifications. Osseous structures are unchanged. IMPRESSION: Stable cardiomegaly. Worsening interstitial and  alveolar airspace opacities concerning for pulmonary edema and/or ARDS. Small to moderate right pleural effusion. Aortic Atherosclerosis (ICD10-I70.0). Electronically Signed   By: Elon Alas M.D.   On: 07/07/2017 03:51   Dg Chest Port 1 View  Result Date: 07/05/2017 CLINICAL DATA:  Shortness of breath EXAM: PORTABLE CHEST 1 VIEW COMPARISON:  03/21/2016; 01/21/2016; 02/05/2015 FINDINGS: Grossly unchanged enlarged cardiac silhouette and mediastinal contours post median sternotomy and CABG. The pulmonary vasculature appears indistinct with cephalization of flow. Small to moderate-sized right-sided pleural effusion with associated right basilar consolidative opacities. Slight worsening of left mid and lower lung heterogeneous potential airspace opacities. No definite left-sided pleural effusion. No pneumothorax. No definite acute osseus abnormalities. Vascular calcifications overlie right axilla and lower neck bilaterally. IMPRESSION: Constellation of findings most worrisome for pulmonary edema with small to moderate-sized right-sided effusion and associated worsening bilateral mid and lower lung heterogeneous/consolidative opacities, atelectasis versus infiltrate. Electronically Signed   By: Sandi Mariscal M.D.   On: 07/05/2017 10:40   Dg Abd Portable 1v  Result Date: 07/10/2017 CLINICAL DATA:  Feeding tube insertion. EXAM: PORTABLE ABDOMEN - 1 VIEW COMPARISON:  07/08/2017 FINDINGS: A feeding tube has been inserted. The tip is in the region of proximal duodenum. Bowel gas pattern is normal. IMPRESSION: Feeding tube tip in the proximal duodenum. Electronically Signed   By: Lorriane Shire M.D.   On: 07/10/2017 13:08   Dg Abd Portable 1v  Result Date: 07/08/2017 CLINICAL DATA:  OG tube placement. EXAM: PORTABLE ABDOMEN - 1 VIEW COMPARISON:  None. FINDINGS: The tip of the enteric tube is below the diaphragm in the stomach, side-port just beyond the gastroesophageal junction. Air-filled bowel in the central  abdomen without obstruction. IMPRESSION: Tip of the enteric tube below the diaphragm, side-port just beyond the gastroesophageal junction. Recommend advancement of at least 3 cm for optimal placement. Electronically Signed   By: Jeb Levering M.D.   On: 07/08/2017 22:24    Consults:    Subjective:    Overnight Issues:   Objective:  Vital signs for last 24 hours: Temp:  [97.5 F (36.4 C)-98.2 F (36.8 C)] 98.2 F (36.8 C) (06/10 0745) Pulse Rate:  [61-72] 69 (06/10 1400) Resp:  [0-19] 18 (06/10 1115) BP: (128-162)/(50-67) 158/57 (06/10 1400) SpO2:  [95 %-  100 %] 100 % (06/10 1115) FiO2 (%):  [40 %-70 %] 40 % (06/10 1115) Weight:  [205 lb 11 oz (93.3 kg)] 205 lb 11 oz (93.3 kg) (06/10 0500)  Hemodynamic parameters for last 24 hours:    Intake/Output from previous day: 06/09 0701 - 06/10 0700 In: 3540.5 [I.V.:1890.5; NG/GT:1200; IV Piggyback:450] Out: 1840 [Urine:1290; Stool:550]  Intake/Output this shift: Total I/O In: 822.2 [I.V.:167.2; Other:210; NG/GT:335; IV Piggyback:110] Out: 375 [Urine:375]  Vent settings for last 24 hours: Vent Mode: PRVC FiO2 (%):  [40 %-70 %] 40 % Set Rate:  [16 bmp] 16 bmp Vt Set:  [550 mL] 550 mL [name]:  [10 cmH20] 10 cmH20 Plateau Pressure:  [22 cmH20-31 cmH20] 22 cmH20  Physical Exam:  General: no respiratory distress and Somnolent, obese Neuro: nonfocal exam, RASS -1 and No focal weakness.  Patient able to follow commands. HEENT/Neck: ETT WNL  and JVD marked Resp: diminished breath sounds bibasilar CVS: regular rate and rhythm, S1, S2 normal, no murmur, click, rub or gallop and S1, S2 are unremarkable.  S4 is positive GI: soft, nontender, BS WNL, no r/g Skin: no rash Extremities: no edema, no erythema, pulses WNL and edema 1+  Assessment/Plan:   NEURO  Altered Mental Status:  encephalopathy and Eckley sedation related.   Plan: Transition to intermittent sedation  PULM  Acute Respiratory Failure (Due to CHF)   Plan: Continue to  wean from ventilator as tolerated.  SBT and extubation tomorrow if possible.  CARDIO  Acute Pulmonary Edema (due to left ventricular dysfunction) from diastolic heart   Plan: Continue to diuresis  RENAL  Acute on chronic renal failure secondary to uncontrolled hypertension.   Plan: Continue to diarrhea  GI  Nutritional Deficiency Moderate   Plan: Continue enteral nutrition until extubated.  The swallowing evaluation at that time.  ID  No signs of pulmonary infection.   Plan: We will discontinue antibiotics.  HEME  Anemia anemia of critical illness)   Plan: Continue to monitor.  ENDO Hyperglycemia (likely due to dextrose infusion.)   Plan: Reassess glucose level after D5W stopped.  Global Issues   Improving respiratory failure with ongoing diuresis.  Should be ready for extubation tomorrow.  Full code.   LOS: 8 days   Additional comments:None  Critical Care Total Time*: 30 Minutes  Aurora Rody 07/13/2017  *Care during the described time interval was provided by me and/or other providers on the critical care team.  I have reviewed this patient's available data, including medical history, events of note, physical examination and test results as part of my evaluation.

## 2017-07-13 NOTE — Progress Notes (Signed)
Inpatient Diabetes Program Recommendations  AACE/ADA: New Consensus Statement on Inpatient Glycemic Control (2015)  Target Ranges:  Prepandial:   less than 140 mg/dL      Peak postprandial:   less than 180 mg/dL (1-2 hours)      Critically ill patients:  140 - 180 mg/dL   Lab Results  Component Value Date   GLUCAP 209 (H) 07/13/2017   HGBA1C 6.9 (H) 07/05/2017    Review of Glycemic Control Results for Leonard [name], Leonard [name] (MRN [id number]) as of 07/13/2017 10:56  Ref. Range 07/12/2017 12:14 07/12/2017 18:03 07/12/2017 23:33 07/13/2017 05:50  Glucose-Capillary Latest Ref Range: 65 - 99 mg/dL 238 (H) 254 (H) 214 (H) 209 (H)   Diabetes history: Type 2 DM Outpatient Diabetes medications: Novolog 15 units at lunch and supper Current orders for Inpatient glycemic control: Novolog 0-15 units Q6H, Lantus 20 units QD   Inpatient Diabetes Program Recommendations:    Given the start of tube feeds on 6/7, recommend adding Novolog 4 units Q6H for tube feed coverage.   Thanks, Bronson Curb, MSN, RNC-OB Diabetes Coordinator [phone] (8a-5p)

## 2017-07-13 NOTE — Progress Notes (Signed)
Pharmacy Antibiotic Note  Leonard [name] is a 67 y.o. male admitted on 07/05/2017 with pneumonia.  Pharmacy has been consulted for Zosyn dosing.  Patient also continues on azithromycin per MD for atypical coverage. He remains vented and there is no growth from cultures. Strep pneumo and legionella urinary antigens negative on 6/70   Plan: Zosyn 3.375g IV q8h EI Azithromycin 500mg  IV q24h per MD Follow clinical progression, repeat cultures, LOT, renal function  Height: 5\' 8"  (172.7 cm) Weight: 205 lb 11 oz (93.3 kg) IBW/kg (Calculated) : 68.4  Temp (24hrs), Avg:97.7 F (36.5 C), Min:97.5 F (36.4 C), Max:98.2 F (36.8 C)  Recent Labs  Lab 07/08/17 0256 07/09/17 0210 07/10/17 0409 07/11/17 0428 07/12/17 0440 07/13/17 0508  WBC 9.7 5.4 3.9*  --  7.0 6.4  CREATININE 2.37* 2.59* 2.66* 2.91* 2.75* 2.77*    Estimated Creatinine Clearance: 29.1 mL/min (A) (by C-G formula based on SCr of 2.77 mg/dL (H)).    No Known Allergies   Vancomycin 6/5 >>6/9 Zosyn 6/5 >> Azithromycin 6/7 >>  6/7 RVP- neg 6/5 blood-ngtd 6/5 resp-negative 6/4 blood- neg 6/4 MRSA PCR- neg  Thank you for allowing pharmacy to be a part of this patient's care.  Leonard [name] D. Kaly Mcquary, PharmD, BCPS Clinical Pharmacist [phone] Please check AMION for all Reader numbers 07/13/2017 9:30 AM

## 2017-07-13 NOTE — Progress Notes (Signed)
FPTS Social Note   Leonard [name] remains intubated and sedated.  We appreciate the excellent care given by CCM and will resume care after he is transferred to the floor.  Leonard Reamy [name], MD PGY-1, [facility] 07/13/2017 1:44 PM

## 2017-07-14 ENCOUNTER — Inpatient Hospital Stay: Payer: Self-pay

## 2017-07-14 LAB — CBC WITH DIFFERENTIAL/PLATELET
Abs Immature Granulocytes: 0.1 10*3/uL (ref 0.0–0.1)
Basophils Absolute: 0 10*3/uL (ref 0.0–0.1)
Basophils Relative: 0 %
EOS ABS: 0.1 10*3/uL (ref 0.0–0.7)
EOS PCT: 2 %
HCT: 25.3 % — ABNORMAL LOW (ref 39.0–52.0)
HEMOGLOBIN: 7.7 g/dL — AB (ref 13.0–17.0)
Immature Granulocytes: 2 %
LYMPHS ABS: 0.4 10*3/uL — AB (ref 0.7–4.0)
LYMPHS PCT: 6 %
MCH: 26.6 pg (ref 26.0–34.0)
MCHC: 30.4 g/dL (ref 30.0–36.0)
MCV: 87.2 fL (ref 78.0–100.0)
MONO ABS: 0.4 10*3/uL (ref 0.1–1.0)
MONOS PCT: 6 %
Neutro Abs: 4.7 10*3/uL (ref 1.7–7.7)
Neutrophils Relative %: 84 %
Platelets: 94 10*3/uL — ABNORMAL LOW (ref 150–400)
RBC: 2.9 MIL/uL — ABNORMAL LOW (ref 4.22–5.81)
RDW: 16.7 % — AB (ref 11.5–15.5)
WBC: 5.6 10*3/uL (ref 4.0–10.5)

## 2017-07-14 LAB — GLUCOSE, CAPILLARY
GLUCOSE-CAPILLARY: 216 mg/dL — AB (ref 65–99)
GLUCOSE-CAPILLARY: 174 mg/dL — AB (ref 65–99)
GLUCOSE-CAPILLARY: 110 mg/dL — AB (ref 65–99)
Glucose-Capillary: 230 mg/dL — ABNORMAL HIGH (ref 65–99)
Glucose-Capillary: 270 mg/dL — ABNORMAL HIGH (ref 65–99)
Glucose-Capillary: 285 mg/dL — ABNORMAL HIGH (ref 65–99)
Glucose-Capillary: 225 mg/dL — ABNORMAL HIGH (ref 65–99)
Glucose-Capillary: 249 mg/dL — ABNORMAL HIGH (ref 65–99)
Glucose-Capillary: 194 mg/dL — ABNORMAL HIGH (ref 65–99)

## 2017-07-14 LAB — KAPPA/LAMBDA LIGHT CHAINS
KAPPA, LAMDA LIGHT CHAIN RATIO: 2.42 — AB (ref 0.26–1.65)
Kappa free light chain: 98 mg/L — ABNORMAL HIGH (ref 3.3–19.4)
Lambda free light chains: 40.5 mg/L — ABNORMAL HIGH (ref 5.7–26.3)

## 2017-07-14 LAB — BASIC METABOLIC PANEL
Anion gap: 9 (ref 5–15)
BUN: 95 mg/dL — AB (ref 6–20)
CALCIUM: 8 mg/dL — AB (ref 8.9–10.3)
CHLORIDE: 111 mmol/L (ref 101–111)
CO2: 23 mmol/L (ref 22–32)
CREATININE: 2.77 mg/dL — AB (ref 0.61–1.24)
GFR calc Af Amer: 26 mL/min — ABNORMAL LOW (ref >60)
GFR calc non Af Amer: 22 mL/min — ABNORMAL LOW (ref >60)
GLUCOSE: 222 mg/dL — AB (ref 65–99)
Potassium: 3.4 mmol/L — ABNORMAL LOW (ref 3.5–5.1)
Sodium: 143 mmol/L (ref 135–145)

## 2017-07-14 LAB — HEPARIN INDUCED PLATELET AB (HIT ANTIBODY): HEPARIN INDUCED PLT AB: 0.149 {OD_unit} (ref 0.000–0.400)

## 2017-07-14 LAB — MAGNESIUM: Magnesium: 2.2 mg/dL (ref 1.7–2.4)

## 2017-07-14 MED ORDER — INSULIN GLARGINE 100 UNIT/ML ~~LOC~~ SOLN
40.0000 [IU] | Freq: Every day | SUBCUTANEOUS | Status: DC
  Administered 2017-07-15 – 2017-07-18 (×4): 40 [IU] via SUBCUTANEOUS
  Filled 2017-07-14 (×5): qty 0.4

## 2017-07-14 MED ORDER — ALBUTEROL SULFATE (2.5 MG/3ML) 0.083% IN NEBU
2.5000 mg | INHALATION_SOLUTION | RESPIRATORY_TRACT | Status: DC | PRN
  Administered 2017-07-21: 2.5 mg via RESPIRATORY_TRACT
  Filled 2017-07-14: qty 3

## 2017-07-14 MED ORDER — FUROSEMIDE 10 MG/ML IJ SOLN
1.0000 mg/h | INTRAVENOUS | Status: DC
  Administered 2017-07-14: 5 mg/h via INTRAVENOUS
  Administered 2017-07-15 – 2017-07-16 (×2): 10 mg/h via INTRAVENOUS
  Administered 2017-07-17: 8 mg/h via INTRAVENOUS
  Filled 2017-07-14 (×3): qty 25
  Filled 2017-07-14: qty 21
  Filled 2017-07-14 (×3): qty 25

## 2017-07-14 MED ORDER — INSULIN ASPART 100 UNIT/ML ~~LOC~~ SOLN
3.0000 [IU] | SUBCUTANEOUS | Status: DC
  Administered 2017-07-14: 9 [IU] via SUBCUTANEOUS
  Administered 2017-07-14 – 2017-07-15 (×2): 6 [IU] via SUBCUTANEOUS
  Administered 2017-07-15 – 2017-07-16 (×3): 9 [IU] via SUBCUTANEOUS
  Administered 2017-07-16: 6 [IU] via SUBCUTANEOUS
  Administered 2017-07-16: 9 [IU] via SUBCUTANEOUS
  Administered 2017-07-16: 6 [IU] via SUBCUTANEOUS

## 2017-07-14 MED ORDER — SODIUM CHLORIDE 0.9 % IV SOLN
INTRAVENOUS | Status: DC
  Administered 2017-07-14: 1.7 [IU]/h via INTRAVENOUS
  Filled 2017-07-14 (×2): qty 1

## 2017-07-14 NOTE — Progress Notes (Signed)
Follow up - Critical Care Medicine Note  Patient Details:    Leonard [name] is an 67 y.o. male.  He was originally admitted for acute hypoxic respiratory failure secondary to acute decompensated diastolic heart failure.  He continued to worsen despite BiPAP.  He was eventually intubated and required significant levels of [name] to improve oxygenation and neuromuscular blockade to improve ventilator synchrony.  Lines, Airways, Drains: Airway 8 mm (Active)  Secured at (cm) 26 cm 07/13/2017 12:00 PM  Measured From Lips 07/13/2017 12:00 PM  Secured Location Right 07/13/2017 12:00 PM  Secured By [employer] 07/13/2017 11:15 AM  Tube Holder Repositioned Yes 07/13/2017 11:15 AM  Cuff Pressure (cm H2O) 26 cm H2O 07/13/2017  8:01 AM  Site Condition Dry 07/13/2017 11:15 AM     CVC Triple Lumen 07/08/17 Right Femoral (Active)  Indication for Insertion or Continuance of Line Vasoactive infusions 07/13/2017  8:00 AM  Site Assessment Clean;Dry;Intact 07/13/2017  8:00 AM  Proximal Lumen Status Infusing 07/13/2017  8:00 AM  Medial Lumen Status Infusing 07/13/2017  8:00 AM  Distal Lumen Status Infusing 07/13/2017  8:00 AM  Dressing Type Transparent;Occlusive 07/13/2017  8:00 AM  Dressing Status Clean;Dry;Intact;Antimicrobial disc in place 07/13/2017  8:00 AM  Line Care Connections checked and tightened 07/13/2017  8:00 AM  Dressing Intervention Dressing changed;Antimicrobial disc changed 07/08/2017  4:00 PM  Dressing Change Due 07/15/17 07/13/2017  8:00 AM     Rectal Tube/Pouch (Active)  Output (mL) 200 mL 07/13/2017  5:35 AM  Intake (mL) 100 mL 07/11/2017  9:00 PM     Urethral Catheter [place], RN Straight-tip 16 Fr. (Active)  Indication for Insertion or Continuance of Catheter Other (comment) 07/13/2017  7:29 AM  Site Assessment Clean;Intact 07/12/2017  8:00 PM  Catheter Maintenance Bag below level of bladder;Catheter secured;Drainage bag/tubing not touching floor;Seal intact;No dependent loops;Insertion date  on drainage bag 07/13/2017  8:00 AM  Collection Container Standard drainage bag 07/12/2017  8:00 PM  Securement Method Securing device (Describe) 07/12/2017  8:00 PM  Urinary Catheter Interventions Unclamped 07/12/2017  8:00 PM  Output (mL) 150 mL 07/13/2017  2:00 PM    Anti-infectives:  Anti-infectives (From admission, onward)   Start     Dose/Rate Route Frequency Ordered Stop   07/13/17 1000  azithromycin (ZITHROMAX) 200 MG/5ML suspension 250 mg  Status:  Discontinued     250 mg Per Tube Daily 07/12/17 1337 07/13/17 1011   07/10/17 1015  azithromycin (ZITHROMAX) 500 mg in sodium chloride 0.9 % 250 mL IVPB     500 mg 250 mL/hr over 60 Minutes Intravenous Every 24 hours 07/10/17 1014 07/12/17 1038   07/09/17 0930  vancomycin (VANCOCIN) IVPB 750 mg/150 ml premix  Status:  Discontinued     750 mg 150 mL/hr over 60 Minutes Intravenous Every 24 hours 07/08/17 0906 07/11/17 0837   07/08/17 1500  piperacillin-tazobactam (ZOSYN) IVPB 3.375 g  Status:  Discontinued     3.375 g 12.5 mL/hr over 240 Minutes Intravenous Every 8 hours 07/08/17 0906 07/13/17 1011   07/08/17 0930  vancomycin (VANCOCIN) IVPB 1000 mg/200 mL premix     1,000 mg 200 mL/hr over 60 Minutes Intravenous  Once 07/08/17 0906 07/08/17 1312   07/08/17 0930  piperacillin-tazobactam (ZOSYN) IVPB 3.375 g  Status:  Discontinued     3.375 g 100 mL/hr over 30 Minutes Intravenous  Once 07/08/17 0906 07/10/17 1051      Microbiology: Results for orders placed or performed during the hospital encounter of 07/05/17  MRSA PCR Screening     Status: None   Collection Time: 07/07/17  5:45 AM  Result Value Ref Range Status   MRSA by PCR NEGATIVE NEGATIVE Final    Comment:        The GeneXpert MRSA Assay (FDA approved for NASAL specimens only), is one component of a comprehensive MRSA colonization surveillance program. It is not intended to diagnose MRSA infection nor to guide or monitor treatment for MRSA infections. Performed at [facility], [place] [address], [place], [place] [address]   Culture, blood (routine x 2)     Status: None   Collection Time: 07/07/17  7:57 AM  Result Value Ref Range Status   Specimen Description BLOOD RIGHT HAND PINKY SIDE  Final   Special Requests   Final    BOTTLES DRAWN AEROBIC ONLY Blood Culture results may not be optimal due to an inadequate volume of blood received in culture bottles   Culture   Final    NO GROWTH 5 DAYS Performed at [facility], [place] [address], [place], [place] [address]    Report Status 07/12/2017 FINAL  Final  Culture, blood (routine x 2)     Status: None   Collection Time: 07/07/17  8:05 AM  Result Value Ref Range Status   Specimen Description BLOOD RIGHT HAND THUMB SIDE  Final   Special Requests   Final    BOTTLES DRAWN AEROBIC ONLY Blood Culture results may not be optimal due to an inadequate volume of blood received in culture bottles   Culture   Final    NO GROWTH 5 DAYS Performed at [facility], [place] [address] [place], [place] [address]    Report Status 07/12/2017 FINAL  Final  Culture, respiratory (NON-Expectorated)     Status: None   Collection Time: 07/08/17 11:12 AM  Result Value Ref Range Status   Specimen Description TRACHEAL ASPIRATE  Final   Special Requests NONE  Final   Gram Stain   Final    FEW WBC PRESENT, PREDOMINANTLY PMN NO ORGANISMS SEEN    Culture   Final    Consistent with normal respiratory flora. Performed at [facility], [place] [address], [place], [place] [address]    Report Status 07/10/2017 FINAL  Final  Culture, blood (routine x 2)     Status: None   Collection Time: 07/08/17 11:18 AM  Result Value Ref Range Status   Specimen Description BLOOD RIGHT HAND  Final   Special Requests   Final    BOTTLES DRAWN AEROBIC ONLY Blood Culture adequate volume   Culture   Final    NO GROWTH 5 DAYS Performed at [facility], [place] [address], [place], [place] [address]    Report Status 07/13/2017 FINAL   Final  Culture, blood (routine x 2)     Status: None   Collection Time: 07/08/17 11:28 AM  Result Value Ref Range Status   Specimen Description BLOOD RIGHT HAND  Final   Special Requests   Final    BOTTLES DRAWN AEROBIC ONLY Blood Culture adequate volume   Culture   Final    NO GROWTH 5 DAYS Performed at [facility], [place] [address], [place],  [address]    Report Status 07/13/2017 FINAL  Final  Respiratory Panel by PCR     Status: None   Collection Time: 07/10/17 11:17 AM  Result Value Ref Range Status   Adenovirus NOT DETECTED NOT  DETECTED Final   Coronavirus 229E NOT DETECTED NOT DETECTED Final   Coronavirus HKU1 NOT DETECTED NOT DETECTED Final   Coronavirus NL63 NOT DETECTED NOT DETECTED Final   Coronavirus OC43 NOT DETECTED NOT DETECTED Final   Metapneumovirus NOT DETECTED NOT DETECTED Final   Rhinovirus / Enterovirus NOT DETECTED NOT DETECTED Final   Influenza A NOT DETECTED NOT DETECTED Final   Influenza B NOT DETECTED NOT DETECTED Final   Parainfluenza Virus 1 NOT DETECTED NOT DETECTED Final   Parainfluenza Virus 2 NOT DETECTED NOT DETECTED Final   Parainfluenza Virus 3 NOT DETECTED NOT DETECTED Final   Parainfluenza Virus 4 NOT DETECTED NOT DETECTED Final   Respiratory Syncytial Virus NOT DETECTED NOT DETECTED Final   Bordetella pertussis NOT DETECTED NOT DETECTED Final   Chlamydophila pneumoniae NOT DETECTED NOT DETECTED Final   Mycoplasma pneumoniae NOT DETECTED NOT DETECTED Final    Comment: Performed at [facility], [place] [address] [place], [place] [address]   *Note: Due to a large number of results and/or encounters for the requested time period, some results have not been displayed. A complete set of results can be found in Results Review.   BMET BMET    Component Value Date/Time   NA 143 07/14/2017 0343   NA 139 02/11/2017 1220   NA 140 08/16/2015 1554   K 3.4 (L) 07/14/2017 0343   K 3.8 08/16/2015 1554   CL 111 07/14/2017 0343   CO2 23  07/14/2017 0343   CO2 21 (L) 08/16/2015 1554   GLUCOSE 222 (H) 07/14/2017 0343   GLUCOSE 133 08/16/2015 1554   BUN 95 (H) 07/14/2017 0343   BUN 54 (H) 02/11/2017 1220   BUN 51.0 (H) 08/16/2015 1554   CREATININE 2.77 (H) 07/14/2017 0343   CREATININE 3.64 (H) 01/09/2016 1606   CREATININE 2.7 (H) 08/16/2015 1554   CALCIUM 8.0 (L) 07/14/2017 0343   CALCIUM NOPLAS 10/24/2015 1415   CALCIUM 8.9 08/16/2015 1554   GFRNONAA 22 (L) 07/14/2017 0343   GFRNONAA 27 (L) 07/20/2015 0949   GFRAA 26 (L) 07/14/2017 0343   GFRAA 31 (L) 07/20/2015 0949   CBC    Component Value Date/Time   WBC 5.6 07/14/2017 0343   RBC 2.90 (L) 07/14/2017 0343   HGB 7.7 (L) 07/14/2017 0343   HGB 8.0 (L) 02/11/2017 1220   HGB 7.3 (L) 01/30/2017 1223   HCT 25.3 (L) 07/14/2017 0343   HCT 26.4 (L) 02/11/2017 1220   HCT 24.1 (L) 01/30/2017 1223   PLT 94 (L) 07/14/2017 0343   PLT 368 02/11/2017 1220   MCV 87.2 07/14/2017 0343   MCV 73 (L) 02/11/2017 1220   MCV 77.0 (L) 01/30/2017 1223   MCH 26.6 07/14/2017 0343   MCHC 30.4 07/14/2017 0343   RDW 16.7 (H) 07/14/2017 0343   RDW 19.2 (H) 02/11/2017 1220   RDW 19.9 (H) 01/30/2017 1223   LYMPHSABS 0.4 (L) 07/14/2017 0343   LYMPHSABS 0.8 (L) 01/30/2017 1223   MONOABS 0.4 07/14/2017 0343   MONOABS 0.4 01/30/2017 1223   EOSABS 0.1 07/14/2017 0343   EOSABS 0.1 01/30/2017 1223   EOSABS 0.2 12/29/2016 1547   BASOSABS 0.0 07/14/2017 0343   BASOSABS 0.0 01/30/2017 1223    Best Practice/Protocols:  VTE Prophylaxis: Heparin (drip) Which has been stopped due to bleeding. ARDS  Events:  With initiation of diuresis, oxygenation has improved.  Continuous sedation discontinued 6/10.   Studies: Echo 07/06/17 Study Conclusions  - Left ventricle: The cavity size was normal.  Wall thickness was   increased in a pattern of mild LVH. Systolic function was   vigorous. The estimated ejection fraction was in the range of 65%   to 70%. Wall motion was normal; there were no  regional wall   motion abnormalities. Features are consistent with a pseudonormal   left ventricular filling pattern, with concomitant abnormal   relaxation and increased filling pressure (grade 2 diastolic   dysfunction). - Aortic valve: Valve area (VTI): 2.52 cm^2. Valve area (Vmax):   2.77 cm^2. Valve area (Vmean): 2.69 cm^2. - Mitral valve: Valve area by continuity equation (using LVOT   flow): 2.58 cm^2. - Tricuspid valve: There was mild regurgitation. - Pulmonary arteries: Systolic pressure was mildly to moderately   increased. PA peak pressure: 46 mm Hg (S).  Consults:    Subjective:    Overnight Issues: remains somnolent and follows commands intermittently.  Objective:  Vital signs for last 24 hours: Temp:  [97.8 F (36.6 C)-99.9 F (37.7 C)] 98.3 F (36.8 C) (06/11 1211) Pulse Rate:  [68-85] 76 (06/11 1400) Resp:  [11-29] 18 (06/11 1400) BP: (151-183)/(53-62) 155/53 (06/11 1400) SpO2:  [97 %-100 %] 99 % (06/11 1400) FiO2 (%):  [40 %] 40 % (06/11 1400) Weight:  [208 lb 8.9 oz (94.6 kg)] 208 lb 8.9 oz (94.6 kg) (06/11 0332)  Hemodynamic parameters for last 24 hours:  Hypertensive  Intake/Output from previous day: 06/10 0701 - 06/11 0700 In: 1876.4 [I.V.:167.2; CZ/YS:0630.1; IV Piggyback:110] Out: 1315 [Urine:1215; Stool:100]  Intake/Output this shift: Total I/O In: 479.4 [I.V.:29.4; NG/GT:450] Out: 465 [Urine:465]  Vent settings for last 24 hours: Vent Mode: PRVC FiO2 (%):  [40 %] 40 % Set Rate:  [16 bmp] 16 bmp Vt Set:  [550 mL] 550 mL [name]:  [5 cmH20-10 cmH20] 5 cmH20 Plateau Pressure:  [13 cmH20-25 cmH20] 18 cmH20  Physical Exam:  General: no respiratory distress and Somnolent, obese Neuro: nonfocal exam, RASS -1 and No focal weakness.  Patient able to follow commands. HEENT/Neck: ETT WNL  and JVD marked Resp: diminished breath sounds bibasilar CVS: regular rate and rhythm, S1, S2 normal, no murmur, click, rub or gallop and S1, S2 are unremarkable.   S4 is positive GI: soft, nontender, BS WNL, no r/g Skin: no rash Extremities: no edema, no erythema, pulses WNL and edema 1+  Assessment/Plan:   NEURO  Altered Mental Status:  encephalopathy and likely sedation related.   Plan: Transition to intermittent sedation  PULM  Acute Respiratory Failure (Due to CHF) has tolerated decrease in [name]   Plan: Continue to wean from ventilator as tolerated.  SBT and extubation once more awake  CARDIO  Acute Pulmonary Edema (due to left ventricular dysfunction) from diastolic heart failure.  Remains in positive fluid balance.   Plan: Continue to diuresis.  start furosemide infusion for more aggressive diuresis.  RENAL  Acute on chronic renal failure secondary to uncontrolled hypertension.   Plan: Continue to diurese.  Continue home antihypertensives  GI  Nutritional Deficiency Moderate   Plan: Continue enteral nutrition until extubated.  The swallowing evaluation at that time.  ID  No signs of pulmonary infection.   Plan: We will discontinue antibiotics.  HEME  Anemia anemia of critical illness)   Plan: Continue to monitor.  ENDO Hyperglycemia (likely due to dextrose infusion.).  Blood sugar remains uncontrolled off of D5W.   Plan: Increase basal insulin and correction scale.  Global Issues   Improving respiratory failure with ongoing diuresis.  Should be ready for once  more awake.  Full code.   LOS: 9 days   Additional comments:None  Critical Care Total Time*: 30 Minutes  Amberlea Spagnuolo 07/14/2017  *Care during the described time interval was provided by me and/or other providers on the critical care team.  I have reviewed this patient's available data, including medical history, events of note, physical examination and test results as part of my evaluation.

## 2017-07-14 NOTE — Progress Notes (Signed)
eLink Physician-Brief Progress Note [name] DOB: [birth year] MRN: [id number]   Date of Service  07/14/2017  HPI/Events of Note  K+ = 3.4 and Creatinine = 2.77.  eICU Interventions  Will defer on K+ replacement at this time d/t renal dysfunction.         Sommer,Steven Eugene 07/14/2017, 5:07 AM

## 2017-07-14 NOTE — Progress Notes (Signed)
FPTS Social Note  Mr. Decaire remains intubated and sedated.  Appreciate care given by CCM and will resume care after patient returns to the floor.    Adonis Yim [name], MD PGY-1, [facility] 07/14/2017 8:34 AM

## 2017-07-15 LAB — CBC WITH DIFFERENTIAL/PLATELET
Abs Immature Granulocytes: 0.1 10*3/uL (ref 0.0–0.1)
BASOS ABS: 0 10*3/uL (ref 0.0–0.1)
Basophils Relative: 0 %
EOS ABS: 0.1 10*3/uL (ref 0.0–0.7)
EOS PCT: 1 %
HCT: 24.3 % — ABNORMAL LOW (ref 39.0–52.0)
Hemoglobin: 7.4 g/dL — ABNORMAL LOW (ref 13.0–17.0)
IMMATURE GRANULOCYTES: 1 %
LYMPHS ABS: 0.4 10*3/uL — AB (ref 0.7–4.0)
Lymphocytes Relative: 6 %
MCH: 26.9 pg (ref 26.0–34.0)
MCHC: 30.5 g/dL (ref 30.0–36.0)
MCV: 88.4 fL (ref 78.0–100.0)
Monocytes Absolute: 0.5 10*3/uL (ref 0.1–1.0)
Monocytes Relative: 7 %
Neutro Abs: 5.6 10*3/uL (ref 1.7–7.7)
Neutrophils Relative %: 85 %
PLATELETS: 100 10*3/uL — AB (ref 150–400)
RBC: 2.75 MIL/uL — AB (ref 4.22–5.81)
RDW: 16.8 % — AB (ref 11.5–15.5)
WBC: 6.6 10*3/uL (ref 4.0–10.5)

## 2017-07-15 LAB — GLUCOSE, CAPILLARY
GLUCOSE-CAPILLARY: 88 mg/dL (ref 65–99)
GLUCOSE-CAPILLARY: 115 mg/dL — AB (ref 65–99)
GLUCOSE-CAPILLARY: 226 mg/dL — AB (ref 65–99)
GLUCOSE-CAPILLARY: 203 mg/dL — AB (ref 65–99)
GLUCOSE-CAPILLARY: 151 mg/dL — AB (ref 65–99)
GLUCOSE-CAPILLARY: 101 mg/dL — AB (ref 65–99)
GLUCOSE-CAPILLARY: 112 mg/dL — AB (ref 65–99)
GLUCOSE-CAPILLARY: 168 mg/dL — AB (ref 65–99)
Glucose-Capillary: 179 mg/dL — ABNORMAL HIGH (ref 65–99)
Glucose-Capillary: 118 mg/dL — ABNORMAL HIGH (ref 65–99)
Glucose-Capillary: 180 mg/dL — ABNORMAL HIGH (ref 65–99)
Glucose-Capillary: 203 mg/dL — ABNORMAL HIGH (ref 65–99)
Glucose-Capillary: 182 mg/dL — ABNORMAL HIGH (ref 65–99)
Glucose-Capillary: 183 mg/dL — ABNORMAL HIGH (ref 65–99)
Glucose-Capillary: 149 mg/dL — ABNORMAL HIGH (ref 65–99)
Glucose-Capillary: 207 mg/dL — ABNORMAL HIGH (ref 65–99)

## 2017-07-15 LAB — POCT I-STAT 3, ART BLOOD GAS (G3+)
ACID-BASE DEFICIT: 3 mmol/L — AB (ref 0.0–2.0)
Bicarbonate: 21.5 mmol/L (ref 20.0–28.0)
O2 SAT: 97 %
PO2 ART: 90 mmHg (ref 83.0–108.0)
Patient temperature: 98.6
TCO2: 23 mmol/L (ref 22–32)
pCO2 arterial: 37 mmHg (ref 32.0–48.0)
pH, Arterial: 7.374 (ref 7.350–7.450)

## 2017-07-15 LAB — BASIC METABOLIC PANEL
Anion gap: 12 (ref 5–15)
BUN: 105 mg/dL — ABNORMAL HIGH (ref 6–20)
CHLORIDE: 110 mmol/L (ref 101–111)
CO2: 22 mmol/L (ref 22–32)
CREATININE: 2.64 mg/dL — AB (ref 0.61–1.24)
Calcium: 8.1 mg/dL — ABNORMAL LOW (ref 8.9–10.3)
GFR calc Af Amer: 27 mL/min — ABNORMAL LOW (ref >60)
GFR, EST NON AFRICAN AMERICAN: 24 mL/min — AB (ref >60)
Glucose, Bld: 242 mg/dL — ABNORMAL HIGH (ref 65–99)
Potassium: 3.1 mmol/L — ABNORMAL LOW (ref 3.5–5.1)
SODIUM: 144 mmol/L (ref 135–145)

## 2017-07-15 MED ORDER — POTASSIUM CHLORIDE 20 MEQ PO PACK
40.0000 meq | PACK | Freq: Two times a day (BID) | ORAL | Status: DC
  Administered 2017-07-15 (×2): 40 meq via ORAL
  Filled 2017-07-15 (×3): qty 2

## 2017-07-15 MED ORDER — POTASSIUM CHLORIDE 10 MEQ/50ML IV SOLN
10.0000 meq | INTRAVENOUS | Status: AC
  Administered 2017-07-15 (×4): 10 meq via INTRAVENOUS
  Filled 2017-07-15 (×4): qty 50

## 2017-07-15 MED ORDER — MINOXIDIL 2.5 MG PO TABS
2.5000 mg | ORAL_TABLET | Freq: Every day | ORAL | Status: DC
  Administered 2017-07-15: 2.5 mg
  Filled 2017-07-15 (×2): qty 1

## 2017-07-15 NOTE — Progress Notes (Signed)
Pt self extubated at this time. Pt was placed on NRB and SPo2 rose from 89% to 99%. RR 28, minimal cough. Pt able to whisper name. Dr. Lynetta Mare paged and stated in ED and unable to come assess patient at this time but to sit patient up in bed and encourage to cough until someone can come to bedside.

## 2017-07-15 NOTE — Progress Notes (Signed)
eLink Physician-Brief Progress Note [name] DOB: 1950/12/31 MRN: [id number]   Date of Service  07/15/2017  HPI/Events of Note  K+ = 3.1 and Creatinine = 2.64. Patient is on a Lasix IV infusion.   eICU Interventions  Will cautiously replace K+.     Intervention Category Major Interventions: Electrolyte abnormality - evaluation and management  Weber Monnier Eugene 07/15/2017, 5:53 AM

## 2017-07-15 NOTE — Progress Notes (Signed)
PCCM Interval Note   Called to bedside for evaluation.  Patient self extubated around 1900 and placed on NRB.   Remains on lasix gtt at 10mg /hr with net -2.6 L.  Patient had been tolerating PSV 5/5, 30% throughout the day, however extubation held off secondary to somnolence and generalized weakness.  Family states his nickname is "Candy".  Sister, [name] [phone]) and niece, Rosyln [phone]) at bedside.   General:  Adult male resting upright in bed in NAD HEENT:  Left nare cortrak Neuro: Weak but awake, verbalizes softly, answers some questions appriopiately CV: RRR PULM: non-labored, rr low 20's, coarse throughout, slight rales left base, no significant secretions, weak to medium cough, congested at times HQ:PRFF, bs active  Extremities: warm/dry, generalized edema  Skin: no rashes   Blood pressure (!) 155/54, pulse 73, temperature 98.4 F (36.9 C), temperature source Oral, resp. rate (!) 24, height 5\' 8"  (1.727 m), weight 210 lb 15.7 oz (95.7 kg), SpO2 100 %/ NRB.  ABG on NRB noted for 7.374/37/90/21.5  Patient switched to HFNC tolerating well at 97% on 15L I think he is protecting his airway at this point without significant WOB.    P:  Remains high risk for intubation, will continue to closely monitor NTS prn  Wean O2 for sats > 92% CXR in am Ongoing aggressive pulm hygiene   Kennieth Rad, AGACNP-BC [facility] Pgr: [phone] or if no answer [phone] 07/15/2017, 9:12 PM

## 2017-07-15 NOTE — Progress Notes (Signed)
Visited with this patient along with sister, and the neighbor who has been helping to look in on Leonard [name].  Family requested prayer as soon as I visited with them and shared who I was.  They are happy with the progress he is making and are trusting in their faith to see him restored.    07/15/17 1358  Clinical Encounter Type  Visited With Patient and family together  Visit Type Initial;Spiritual support

## 2017-07-15 NOTE — Progress Notes (Signed)
Follow up - Critical Care Medicine Note  Patient Details:    Leonard [name] is an 67 y.o. male.  He was originally admitted for acute hypoxic respiratory failure secondary to acute decompensated diastolic heart failure.  He continued to worsen despite BiPAP.  He was eventually intubated and required significant levels of [name] to improve oxygenation and neuromuscular blockade to improve ventilator synchrony.  Lines, Airways, Drains: Airway 8 mm (Active)  Secured at (cm) 26 cm 07/13/2017 12:00 PM  Measured From Lips 07/13/2017 12:00 PM  Secured Location Right 07/13/2017 12:00 PM  Secured By [employer] 07/13/2017 11:15 AM  Tube Holder Repositioned Yes 07/13/2017 11:15 AM  Cuff Pressure (cm H2O) 26 cm H2O 07/13/2017  8:01 AM  Site Condition Dry 07/13/2017 11:15 AM     CVC Triple Lumen 07/08/17 Right Femoral (Active)  Indication for Insertion or Continuance of Line Vasoactive infusions 07/13/2017  8:00 AM  Site Assessment Clean;Dry;Intact 07/13/2017  8:00 AM  Proximal Lumen Status Infusing 07/13/2017  8:00 AM  Medial Lumen Status Infusing 07/13/2017  8:00 AM  Distal Lumen Status Infusing 07/13/2017  8:00 AM  Dressing Type Transparent;Occlusive 07/13/2017  8:00 AM  Dressing Status Clean;Dry;Intact;Antimicrobial disc in place 07/13/2017  8:00 AM  Line Care Connections checked and tightened 07/13/2017  8:00 AM  Dressing Intervention Dressing changed;Antimicrobial disc changed 07/08/2017  4:00 PM  Dressing Change Due 07/15/17 07/13/2017  8:00 AM     Rectal Tube/Pouch (Active)  Output (mL) 200 mL 07/13/2017  5:35 AM  Intake (mL) 100 mL 07/11/2017  9:00 PM     Urethral Catheter [place], RN Straight-tip 16 Fr. (Active)  Indication for Insertion or Continuance of Catheter Other (comment) 07/13/2017  7:29 AM  Site Assessment Clean;Intact 07/12/2017  8:00 PM  Catheter Maintenance Bag below level of bladder;Catheter secured;Drainage bag/tubing not touching floor;Seal intact;No dependent loops;Insertion date  on drainage bag 07/13/2017  8:00 AM  Collection Container Standard drainage bag 07/12/2017  8:00 PM  Securement Method Securing device (Describe) 07/12/2017  8:00 PM  Urinary Catheter Interventions Unclamped 07/12/2017  8:00 PM  Output (mL) 150 mL 07/13/2017  2:00 PM    Anti-infectives:  Anti-infectives (From admission, onward)   Start     Dose/Rate Route Frequency Ordered Stop   07/13/17 1000  azithromycin (ZITHROMAX) 200 MG/5ML suspension 250 mg  Status:  Discontinued     250 mg Per Tube Daily 07/12/17 1337 07/13/17 1011   07/10/17 1015  azithromycin (ZITHROMAX) 500 mg in sodium chloride 0.9 % 250 mL IVPB     500 mg 250 mL/hr over 60 Minutes Intravenous Every 24 hours 07/10/17 1014 07/12/17 1038   07/09/17 0930  vancomycin (VANCOCIN) IVPB 750 mg/150 ml premix  Status:  Discontinued     750 mg 150 mL/hr over 60 Minutes Intravenous Every 24 hours 07/08/17 0906 07/11/17 0837   07/08/17 1500  piperacillin-tazobactam (ZOSYN) IVPB 3.375 g  Status:  Discontinued     3.375 g 12.5 mL/hr over 240 Minutes Intravenous Every 8 hours 07/08/17 0906 07/13/17 1011   07/08/17 0930  vancomycin (VANCOCIN) IVPB 1000 mg/200 mL premix     1,000 mg 200 mL/hr over 60 Minutes Intravenous  Once 07/08/17 0906 07/08/17 1312   07/08/17 0930  piperacillin-tazobactam (ZOSYN) IVPB 3.375 g  Status:  Discontinued     3.375 g 100 mL/hr over 30 Minutes Intravenous  Once 07/08/17 0906 07/10/17 1051      Microbiology: Results for orders placed or performed during the hospital encounter of 07/05/17  MRSA PCR Screening     Status: None   Collection Time: 07/07/17  5:45 AM  Result Value Ref Range Status   MRSA by PCR NEGATIVE NEGATIVE Final    Comment:        The GeneXpert MRSA Assay (FDA approved for NASAL specimens only), is one component of a comprehensive MRSA colonization surveillance program. It is not intended to diagnose MRSA infection nor to guide or monitor treatment for MRSA infections. Performed at [facility], [place] [address] [place], [place] [address]   Culture, blood (routine x 2)     Status: None   Collection Time: 07/07/17  7:57 AM  Result Value Ref Range Status   Specimen Description BLOOD RIGHT HAND PINKY SIDE  Final   Special Requests   Final    BOTTLES DRAWN AEROBIC ONLY Blood Culture results may not be optimal due to an inadequate volume of blood received in culture bottles   Culture   Final    NO GROWTH 5 DAYS Performed at [facility], [place] [address] [place], [place] [address]    Report Status 07/12/2017 FINAL  Final  Culture, blood (routine x 2)     Status: None   Collection Time: 07/07/17  8:05 AM  Result Value Ref Range Status   Specimen Description BLOOD RIGHT HAND THUMB SIDE  Final   Special Requests   Final    BOTTLES DRAWN AEROBIC ONLY Blood Culture results may not be optimal due to an inadequate volume of blood received in culture bottles   Culture   Final    NO GROWTH 5 DAYS Performed at [facility], [place] [address] [place], [place] [address]    Report Status 07/12/2017 FINAL  Final  Culture, respiratory (NON-Expectorated)     Status: None   Collection Time: 07/08/17 11:12 AM  Result Value Ref Range Status   Specimen Description TRACHEAL ASPIRATE  Final   Special Requests NONE  Final   Gram Stain   Final    FEW WBC PRESENT, PREDOMINANTLY PMN NO ORGANISMS SEEN    Culture   Final    Consistent with normal respiratory flora. Performed at [facility], [place] [address] [place], [place] [address]    Report Status 07/10/2017 FINAL  Final  Culture, blood (routine x 2)     Status: None   Collection Time: 07/08/17 11:18 AM  Result Value Ref Range Status   Specimen Description BLOOD RIGHT HAND  Final   Special Requests   Final    BOTTLES DRAWN AEROBIC ONLY Blood Culture adequate volume   Culture   Final    NO GROWTH 5 DAYS Performed at [facility], [place] [address], [place], [place] [address]    Report Status 07/13/2017 FINAL   Final  Culture, blood (routine x 2)     Status: None   Collection Time: 07/08/17 11:28 AM  Result Value Ref Range Status   Specimen Description BLOOD RIGHT HAND  Final   Special Requests   Final    BOTTLES DRAWN AEROBIC ONLY Blood Culture adequate volume   Culture   Final    NO GROWTH 5 DAYS Performed at [facility], [place] [address], [place], [place] [address]    Report Status 07/13/2017 FINAL  Final  Respiratory Panel by PCR     Status: None   Collection Time: 07/10/17 11:17 AM  Result Value Ref Range Status   Adenovirus NOT DETECTED NOT  DETECTED Final   Coronavirus 229E NOT DETECTED NOT DETECTED Final   Coronavirus HKU1 NOT DETECTED NOT DETECTED Final   Coronavirus NL63 NOT DETECTED NOT DETECTED Final   Coronavirus OC43 NOT DETECTED NOT DETECTED Final   Metapneumovirus NOT DETECTED NOT DETECTED Final   Rhinovirus / Enterovirus NOT DETECTED NOT DETECTED Final   Influenza A NOT DETECTED NOT DETECTED Final   Influenza B NOT DETECTED NOT DETECTED Final   Parainfluenza Virus 1 NOT DETECTED NOT DETECTED Final   Parainfluenza Virus 2 NOT DETECTED NOT DETECTED Final   Parainfluenza Virus 3 NOT DETECTED NOT DETECTED Final   Parainfluenza Virus 4 NOT DETECTED NOT DETECTED Final   Respiratory Syncytial Virus NOT DETECTED NOT DETECTED Final   Bordetella pertussis NOT DETECTED NOT DETECTED Final   Chlamydophila pneumoniae NOT DETECTED NOT DETECTED Final   Mycoplasma pneumoniae NOT DETECTED NOT DETECTED Final    Comment: Performed at [facility], [place] [address], [place], [place] [address]   *Note: Due to a large number of results and/or encounters for the requested time period, some results have not been displayed. A complete set of results can be found in Results Review.   BMET BMET    Component Value Date/Time   NA 144 07/15/2017 0300   NA 139 02/11/2017 1220   NA 140 08/16/2015 1554   K 3.1 (L) 07/15/2017 0300   K 3.8 08/16/2015 1554   CL 110 07/15/2017 0300   CO2 22  07/15/2017 0300   CO2 21 (L) 08/16/2015 1554   GLUCOSE 242 (H) 07/15/2017 0300   GLUCOSE 133 08/16/2015 1554   BUN 105 (H) 07/15/2017 0300   BUN 54 (H) 02/11/2017 1220   BUN 51.0 (H) 08/16/2015 1554   CREATININE 2.64 (H) 07/15/2017 0300   CREATININE 3.64 (H) 01/09/2016 1606   CREATININE 2.7 (H) 08/16/2015 1554   CALCIUM 8.1 (L) 07/15/2017 0300   CALCIUM NOPLAS 10/24/2015 1415   CALCIUM 8.9 08/16/2015 1554   GFRNONAA 24 (L) 07/15/2017 0300   GFRNONAA 27 (L) 07/20/2015 0949   GFRAA 27 (L) 07/15/2017 0300   GFRAA 31 (L) 07/20/2015 0949   CBC    Component Value Date/Time   WBC 6.6 07/15/2017 0300   RBC 2.75 (L) 07/15/2017 0300   HGB 7.4 (L) 07/15/2017 0300   HGB 8.0 (L) 02/11/2017 1220   HGB 7.3 (L) 01/30/2017 1223   HCT 24.3 (L) 07/15/2017 0300   HCT 26.4 (L) 02/11/2017 1220   HCT 24.1 (L) 01/30/2017 1223   PLT 100 (L) 07/15/2017 0300   PLT 368 02/11/2017 1220   MCV 88.4 07/15/2017 0300   MCV 73 (L) 02/11/2017 1220   MCV 77.0 (L) 01/30/2017 1223   MCH 26.9 07/15/2017 0300   MCHC 30.5 07/15/2017 0300   RDW 16.8 (H) 07/15/2017 0300   RDW 19.2 (H) 02/11/2017 1220   RDW 19.9 (H) 01/30/2017 1223   LYMPHSABS 0.4 (L) 07/15/2017 0300   LYMPHSABS 0.8 (L) 01/30/2017 1223   MONOABS 0.5 07/15/2017 0300   MONOABS 0.4 01/30/2017 1223   EOSABS 0.1 07/15/2017 0300   EOSABS 0.1 01/30/2017 1223   EOSABS 0.2 12/29/2016 1547   BASOSABS 0.0 07/15/2017 0300   BASOSABS 0.0 01/30/2017 1223    Best Practice/Protocols:  VTE Prophylaxis: Heparin (drip) Which has been stopped due to bleeding. ARDS  Events:  With initiation of diuresis, oxygenation has improved.  Continuous sedation discontinued 6/10.   Studies: Echo 07/06/17 Study Conclusions  - Left ventricle: The cavity size was normal.  Wall thickness was   increased in a pattern of mild LVH. Systolic function was   vigorous. The estimated ejection fraction was in the range of 65%   to 70%. Wall motion was normal; there were no  regional wall   motion abnormalities. Features are consistent with a pseudonormal   left ventricular filling pattern, with concomitant abnormal   relaxation and increased filling pressure (grade 2 diastolic   dysfunction). - Aortic valve: Valve area (VTI): 2.52 cm^2. Valve area (Vmax):   2.77 cm^2. Valve area (Vmean): 2.69 cm^2. - Mitral valve: Valve area by continuity equation (using LVOT   flow): 2.58 cm^2. - Tricuspid valve: There was mild regurgitation. - Pulmonary arteries: Systolic pressure was mildly to moderately   increased. PA peak pressure: 46 mm Hg (S).     Subjective:    Overnight Issues: No acute events.   Remains hypertensive.  Good UOP, currently net -1.78 L.  Objective:  Vital signs for last 24 hours: Temp:  [98.3 F (36.8 C)-100 F (37.8 C)] 99.1 F (37.3 C) (06/12 0730) Pulse Rate:  [68-88] 80 (06/12 0824) Resp:  [13-29] 21 (06/12 0824) BP: (153-181)/(49-62) 180/62 (06/12 0824) SpO2:  [95 %-100 %] 97 % (06/12 0826) FiO2 (%):  [30 %-40 %] 30 % (06/12 0826) Weight:  [95.7 kg (210 lb 15.7 oz)] 95.7 kg (210 lb 15.7 oz) (06/12 0316)  Hemodynamic parameters for last 24 hours:  Hypertensive  Intake/Output from previous day: 06/11 0701 - 06/12 0700 In: 1625.1 [I.V.:212.6; NG/GT:1412.5] Out: 2272 [DJMEQ:6834; Stool:725]  Intake/Output this shift: Total I/O In: -  Out: 115 [Urine:115]  Vent settings for last 24 hours: Vent Mode: CPAP;PSV FiO2 (%):  [30 %-40 %] 30 % Set Rate:  [16 bmp] 16 bmp Vt Set:  [550 mL] 550 mL [name]:  [5 cmH20] 5 cmH20 Pressure Support:  [5 cmH20] 5 cmH20 Plateau Pressure:  [18 cmH20-22 cmH20] 22 cmH20  Physical Exam:  General: Adult male, resting in bed, in NAD. Neuro: Somnolent despite no sedation.  Opens eyes to voice and can intermittently follow some basic commands. HEENT: [employer]/AT. Sclerae anicteric. Cardiovascular: RRR, no M/R/G.  Lungs: Respirations even and unlabored.  Faint crackles in bases. Abdomen: BS x 4, soft,  NT/ND.  Musculoskeletal: No gross deformities, no edema.  Skin: Intact, warm, no rashes.   Assessment/Plan:   NEURO  Altered Mental Status:  encephalopathy and likely sedation related.   Plan: Transition to intermittent sedation with daily WUA RASS goal: 0.  PULM  Acute Respiratory Failure (Due to CHF) has tolerated SBT for past 30 minutes at PS 5/5.   Plan: Continue to wean.  Would like mental status to clear more before extubation - will likely revisit tomorrow AM 6/13.  CARDIO  Acute Pulmonary Edema (due to left ventricular dysfunction) from diastolic heart failure.  HTN.   Plan: Continue to diuresis with furosemide infusion for more aggressive diuresis. Continue antihypertensives.  Will add low dose minoxidil, assess response and adjust dose as necessary.  RENAL  Acute on chronic renal failure secondary to uncontrolled hypertension.  Hypokalemia - s/p repletion.    Plan: Continue to diurese.  Continue home antihypertensives Follow BMP.  GI  Nutritional Deficiency Moderate   Plan: Continue enteral nutrition until extubated.  The swallowing evaluation at that time.  ID  No indication of infection.   Plan: We will discontinue antibiotics.  HEME  Anemia anemia of critical illness)   Plan: Transfuse for Hgb < 7.  ENDO Hyperglycemia (likely due to dextrose  infusion.).  Blood sugar remains uncontrolled off of D5W. Hypothyroidism.   Plan: Increase basal insulin and correction scale. Continue synthroid.  Global Issues   Improving respiratory failure with ongoing diuresis.  Should be ready for SBT once more awake.  Full code.   LOS: 10 days   Additional comments:None  Critical Care Total Time: 30 minutes.   Montey Hora, [facility] Pager: [phone]  or [phone] 07/15/2017, 9:07 AM

## 2017-07-15 NOTE — Progress Notes (Signed)
Patient self extubated.  Placed on non-rebreathing mask.  MD notified.  SaO2 - 100 %

## 2017-07-15 NOTE — Progress Notes (Signed)
Arterial blood gas drawn and given to CCM. Patient was removed from the 100% NRB and placed on 12 lpm high flow cannula.

## 2017-07-16 ENCOUNTER — Inpatient Hospital Stay (HOSPITAL_COMMUNITY): Payer: Medicare Other

## 2017-07-16 LAB — CBC WITH DIFFERENTIAL/PLATELET
Abs Immature Granulocytes: 0.1 10*3/uL (ref 0.0–0.1)
BASOS ABS: 0 10*3/uL (ref 0.0–0.1)
BASOS PCT: 0 %
EOS ABS: 0.1 10*3/uL (ref 0.0–0.7)
EOS PCT: 2 %
HCT: 26.4 % — ABNORMAL LOW (ref 39.0–52.0)
HEMOGLOBIN: 8 g/dL — AB (ref 13.0–17.0)
Immature Granulocytes: 1 %
Lymphocytes Relative: 6 %
Lymphs Abs: 0.5 10*3/uL — ABNORMAL LOW (ref 0.7–4.0)
MCH: 26.8 pg (ref 26.0–34.0)
MCHC: 30.3 g/dL (ref 30.0–36.0)
MCV: 88.3 fL (ref 78.0–100.0)
MONO ABS: 0.4 10*3/uL (ref 0.1–1.0)
Monocytes Relative: 6 %
Neutro Abs: 6 10*3/uL (ref 1.7–7.7)
Neutrophils Relative %: 85 %
PLATELETS: 122 10*3/uL — AB (ref 150–400)
RBC: 2.99 MIL/uL — ABNORMAL LOW (ref 4.22–5.81)
RDW: 16.7 % — AB (ref 11.5–15.5)
WBC: 7.1 10*3/uL (ref 4.0–10.5)

## 2017-07-16 LAB — BASIC METABOLIC PANEL
ANION GAP: 7 (ref 5–15)
BUN: 113 mg/dL — ABNORMAL HIGH (ref 6–20)
CALCIUM: 8.3 mg/dL — AB (ref 8.9–10.3)
CO2: 23 mmol/L (ref 22–32)
Chloride: 116 mmol/L — ABNORMAL HIGH (ref 101–111)
Creatinine, Ser: 2.33 mg/dL — ABNORMAL HIGH (ref 0.61–1.24)
GFR, EST AFRICAN AMERICAN: 32 mL/min — AB (ref >60)
GFR, EST NON AFRICAN AMERICAN: 27 mL/min — AB (ref >60)
GLUCOSE: 222 mg/dL — AB (ref 65–99)
POTASSIUM: 3.7 mmol/L (ref 3.5–5.1)
SODIUM: 146 mmol/L — AB (ref 135–145)

## 2017-07-16 LAB — PHOSPHORUS: PHOSPHORUS: 3.4 mg/dL (ref 2.5–4.6)

## 2017-07-16 LAB — GLUCOSE, CAPILLARY
GLUCOSE-CAPILLARY: 139 mg/dL — AB (ref 65–99)
GLUCOSE-CAPILLARY: 141 mg/dL — AB (ref 65–99)
GLUCOSE-CAPILLARY: 146 mg/dL — AB (ref 65–99)
GLUCOSE-CAPILLARY: 183 mg/dL — AB (ref 65–99)
GLUCOSE-CAPILLARY: 196 mg/dL — AB (ref 65–99)
GLUCOSE-CAPILLARY: 250 mg/dL — AB (ref 65–99)
Glucose-Capillary: 211 mg/dL — ABNORMAL HIGH (ref 65–99)

## 2017-07-16 LAB — MAGNESIUM: MAGNESIUM: 2.3 mg/dL (ref 1.7–2.4)

## 2017-07-16 MED ORDER — POTASSIUM CHLORIDE 20 MEQ/15ML (10%) PO SOLN: 40.0000 meq | Freq: Two times a day (BID) | ORAL | Status: DC

## 2017-07-16 MED ORDER — CHLORHEXIDINE GLUCONATE 0.12 % MT SOLN: 15.0000 mL | Freq: Two times a day (BID) | OROMUCOSAL | Status: DC

## 2017-07-16 MED ORDER — ALLOPURINOL 100 MG PO TABS
100.0000 mg | ORAL_TABLET | Freq: Every day | ORAL | Status: DC
  Administered 2017-07-16 – 2017-07-21 (×6): 100 mg via ORAL
  Filled 2017-07-16 (×8): qty 1

## 2017-07-16 MED ORDER — ATORVASTATIN CALCIUM 40 MG PO TABS
40.0000 mg | ORAL_TABLET | Freq: Every day | ORAL | Status: DC
  Administered 2017-07-16 – 2017-07-21 (×6): 40 mg via ORAL
  Filled 2017-07-16 (×7): qty 1

## 2017-07-16 MED ORDER — ORAL CARE MOUTH RINSE: 15.0000 mL | Freq: Two times a day (BID) | OROMUCOSAL | Status: DC

## 2017-07-16 MED ORDER — FERROUS SULFATE 300 (60 FE) MG/5ML PO SYRP
300.0000 mg | ORAL_SOLUTION | Freq: Two times a day (BID) | ORAL | Status: DC
  Administered 2017-07-16 – 2017-07-22 (×12): 300 mg via ORAL
  Filled 2017-07-16 (×15): qty 5

## 2017-07-16 MED ORDER — CARVEDILOL 25 MG PO TABS
25.0000 mg | ORAL_TABLET | Freq: Two times a day (BID) | ORAL | Status: DC
  Administered 2017-07-16 – 2017-07-22 (×12): 25 mg via ORAL
  Filled 2017-07-16 (×13): qty 1

## 2017-07-16 MED ORDER — INSULIN ASPART 100 UNIT/ML ~~LOC~~ SOLN
0.0000 [IU] | SUBCUTANEOUS | Status: DC
  Administered 2017-07-16 – 2017-07-17 (×3): 3 [IU] via SUBCUTANEOUS
  Administered 2017-07-17: 4 [IU] via SUBCUTANEOUS
  Administered 2017-07-17 – 2017-07-20 (×3): 3 [IU] via SUBCUTANEOUS
  Administered 2017-07-20: 4 [IU] via SUBCUTANEOUS
  Administered 2017-07-20: 3 [IU] via SUBCUTANEOUS
  Administered 2017-07-21: 4 [IU] via SUBCUTANEOUS
  Administered 2017-07-22: 3 [IU] via SUBCUTANEOUS
  Administered 2017-07-22 (×2): 4 [IU] via SUBCUTANEOUS
  Administered 2017-07-23 (×2): 7 [IU] via SUBCUTANEOUS
  Administered 2017-07-23: 4 [IU] via SUBCUTANEOUS
  Administered 2017-07-23: 7 [IU] via SUBCUTANEOUS
  Administered 2017-07-23 (×3): 4 [IU] via SUBCUTANEOUS
  Administered 2017-07-24: 7 [IU] via SUBCUTANEOUS
  Administered 2017-07-24: 3 [IU] via SUBCUTANEOUS
  Administered 2017-07-24 – 2017-07-25 (×7): 4 [IU] via SUBCUTANEOUS
  Administered 2017-07-25: 7 [IU] via SUBCUTANEOUS
  Administered 2017-07-25 – 2017-07-26 (×3): 11 [IU] via SUBCUTANEOUS
  Administered 2017-07-26: 15 [IU] via SUBCUTANEOUS
  Administered 2017-07-26: 11 [IU] via SUBCUTANEOUS
  Administered 2017-07-26: 15 [IU] via SUBCUTANEOUS
  Administered 2017-07-26: 7 [IU] via SUBCUTANEOUS
  Administered 2017-07-27 (×2): 4 [IU] via SUBCUTANEOUS
  Administered 2017-07-27: 11 [IU] via SUBCUTANEOUS
  Administered 2017-07-27 – 2017-07-28 (×4): 4 [IU] via SUBCUTANEOUS
  Administered 2017-07-28: 7 [IU] via SUBCUTANEOUS
  Administered 2017-07-28 (×3): 4 [IU] via SUBCUTANEOUS
  Administered 2017-07-29 (×2): 3 [IU] via SUBCUTANEOUS
  Administered 2017-07-29: 4 [IU] via SUBCUTANEOUS
  Administered 2017-07-29 – 2017-07-30 (×2): 3 [IU] via SUBCUTANEOUS

## 2017-07-16 MED ORDER — AMLODIPINE BESYLATE 10 MG PO TABS
10.0000 mg | ORAL_TABLET | Freq: Every day | ORAL | Status: DC
  Administered 2017-07-17 – 2017-07-22 (×6): 10 mg via ORAL
  Filled 2017-07-16 (×6): qty 1

## 2017-07-16 MED ORDER — HEPARIN SODIUM (PORCINE) 5000 UNIT/ML IJ SOLN
5000.0000 [IU] | Freq: Three times a day (TID) | INTRAMUSCULAR | Status: DC
  Administered 2017-07-16 – 2017-07-20 (×14): 5000 [IU] via SUBCUTANEOUS
  Filled 2017-07-16 (×14): qty 1

## 2017-07-16 MED ORDER — ISOSORBIDE DINITRATE 20 MG PO TABS
20.0000 mg | ORAL_TABLET | Freq: Three times a day (TID) | ORAL | Status: DC
  Administered 2017-07-16 – 2017-07-22 (×18): 20 mg via ORAL
  Filled 2017-07-16 (×5): qty 1
  Filled 2017-07-16: qty 2
  Filled 2017-07-16 (×4): qty 1
  Filled 2017-07-16: qty 2
  Filled 2017-07-16 (×4): qty 1
  Filled 2017-07-16: qty 2
  Filled 2017-07-16 (×3): qty 1

## 2017-07-16 MED ORDER — MINOXIDIL 2.5 MG PO TABS
5.0000 mg | ORAL_TABLET | Freq: Every day | ORAL | Status: DC
  Administered 2017-07-16 – 2017-07-22 (×7): 5 mg via ORAL
  Filled 2017-07-16 (×7): qty 2

## 2017-07-16 MED ORDER — DOXAZOSIN MESYLATE 8 MG PO TABS
8.0000 mg | ORAL_TABLET | Freq: Every day | ORAL | Status: DC
  Administered 2017-07-16 – 2017-07-21 (×6): 8 mg via ORAL
  Filled 2017-07-16: qty 4
  Filled 2017-07-16: qty 1
  Filled 2017-07-16: qty 4
  Filled 2017-07-16 (×2): qty 1
  Filled 2017-07-16: qty 4

## 2017-07-16 MED ORDER — HYDRALAZINE HCL 50 MG PO TABS
100.0000 mg | ORAL_TABLET | Freq: Three times a day (TID) | ORAL | Status: DC
  Administered 2017-07-16 – 2017-07-22 (×14): 100 mg via ORAL
  Filled 2017-07-16 (×18): qty 2

## 2017-07-16 NOTE — Progress Notes (Signed)
Follow up - Critical Care Medicine Note  Patient Details:    Leonard [name] is an 67 y.o. male.  He was originally admitted for acute hypoxic respiratory failure secondary to acute decompensated diastolic heart failure.  He continued to worsen despite BiPAP.  He was eventually intubated and required significant levels of [name] to improve oxygenation and neuromuscular blockade to improve ventilator synchrony.  Lines, Airways, Drains: Airway 8 mm (Active)  Secured at (cm) 26 cm 07/13/2017 12:00 PM  Measured From Lips 07/13/2017 12:00 PM  Secured Location Right 07/13/2017 12:00 PM  Secured By [employer] 07/13/2017 11:15 AM  Tube Holder Repositioned Yes 07/13/2017 11:15 AM  Cuff Pressure (cm H2O) 26 cm H2O 07/13/2017  8:01 AM  Site Condition Dry 07/13/2017 11:15 AM     CVC Triple Lumen 07/08/17 Right Femoral (Active)  Indication for Insertion or Continuance of Line Vasoactive infusions 07/13/2017  8:00 AM  Site Assessment Clean;Dry;Intact 07/13/2017  8:00 AM  Proximal Lumen Status Infusing 07/13/2017  8:00 AM  Medial Lumen Status Infusing 07/13/2017  8:00 AM  Distal Lumen Status Infusing 07/13/2017  8:00 AM  Dressing Type Transparent;Occlusive 07/13/2017  8:00 AM  Dressing Status Clean;Dry;Intact;Antimicrobial disc in place 07/13/2017  8:00 AM  Line Care Connections checked and tightened 07/13/2017  8:00 AM  Dressing Intervention Dressing changed;Antimicrobial disc changed 07/08/2017  4:00 PM  Dressing Change Due 07/15/17 07/13/2017  8:00 AM     Rectal Tube/Pouch (Active)  Output (mL) 200 mL 07/13/2017  5:35 AM  Intake (mL) 100 mL 07/11/2017  9:00 PM     Urethral Catheter [place], RN Straight-tip 16 Fr. (Active)  Indication for Insertion or Continuance of Catheter Other (comment) 07/13/2017  7:29 AM  Site Assessment Clean;Intact 07/12/2017  8:00 PM  Catheter Maintenance Bag below level of bladder;Catheter secured;Drainage bag/tubing not touching floor;Seal intact;No dependent loops;Insertion date  on drainage bag 07/13/2017  8:00 AM  Collection Container Standard drainage bag 07/12/2017  8:00 PM  Securement Method Securing device (Describe) 07/12/2017  8:00 PM  Urinary Catheter Interventions Unclamped 07/12/2017  8:00 PM  Output (mL) 150 mL 07/13/2017  2:00 PM    Anti-infectives:  Anti-infectives (From admission, onward)   Start     Dose/Rate Route Frequency Ordered Stop   07/13/17 1000  azithromycin (ZITHROMAX) 200 MG/5ML suspension 250 mg  Status:  Discontinued     250 mg Per Tube Daily 07/12/17 1337 07/13/17 1011   07/10/17 1015  azithromycin (ZITHROMAX) 500 mg in sodium chloride 0.9 % 250 mL IVPB     500 mg 250 mL/hr over 60 Minutes Intravenous Every 24 hours 07/10/17 1014 07/12/17 1038   07/09/17 0930  vancomycin (VANCOCIN) IVPB 750 mg/150 ml premix  Status:  Discontinued     750 mg 150 mL/hr over 60 Minutes Intravenous Every 24 hours 07/08/17 0906 07/11/17 0837   07/08/17 1500  piperacillin-tazobactam (ZOSYN) IVPB 3.375 g  Status:  Discontinued     3.375 g 12.5 mL/hr over 240 Minutes Intravenous Every 8 hours 07/08/17 0906 07/13/17 1011   07/08/17 0930  vancomycin (VANCOCIN) IVPB 1000 mg/200 mL premix     1,000 mg 200 mL/hr over 60 Minutes Intravenous  Once 07/08/17 0906 07/08/17 1312   07/08/17 0930  piperacillin-tazobactam (ZOSYN) IVPB 3.375 g  Status:  Discontinued     3.375 g 100 mL/hr over 30 Minutes Intravenous  Once 07/08/17 0906 07/10/17 1051      Microbiology: Results for orders placed or performed during the hospital encounter of 07/05/17  MRSA PCR Screening     Status: None   Collection Time: 07/07/17  5:45 AM  Result Value Ref Range Status   MRSA by PCR NEGATIVE NEGATIVE Final    Comment:        The GeneXpert MRSA Assay (FDA approved for NASAL specimens only), is one component of a comprehensive MRSA colonization surveillance program. It is not intended to diagnose MRSA infection nor to guide or monitor treatment for MRSA infections. Performed at [facility], [place] [address] [place], [place] [address]   Culture, blood (routine x 2)     Status: None   Collection Time: 07/07/17  7:57 AM  Result Value Ref Range Status   Specimen Description BLOOD RIGHT HAND PINKY SIDE  Final   Special Requests   Final    BOTTLES DRAWN AEROBIC ONLY Blood Culture results may not be optimal due to an inadequate volume of blood received in culture bottles   Culture   Final    NO GROWTH 5 DAYS Performed at [facility], [place] [address], [place], [place] [address]    Report Status 07/12/2017 FINAL  Final  Culture, blood (routine x 2)     Status: None   Collection Time: 07/07/17  8:05 AM  Result Value Ref Range Status   Specimen Description BLOOD RIGHT HAND THUMB SIDE  Final   Special Requests   Final    BOTTLES DRAWN AEROBIC ONLY Blood Culture results may not be optimal due to an inadequate volume of blood received in culture bottles   Culture   Final    NO GROWTH 5 DAYS Performed at [facility], [place] [address] [place], [place] [address]    Report Status 07/12/2017 FINAL  Final  Culture, respiratory (NON-Expectorated)     Status: None   Collection Time: 07/08/17 11:12 AM  Result Value Ref Range Status   Specimen Description TRACHEAL ASPIRATE  Final   Special Requests NONE  Final   Gram Stain   Final    FEW WBC PRESENT, PREDOMINANTLY PMN NO ORGANISMS SEEN    Culture   Final    Consistent with normal respiratory flora. Performed at [facility], [place] [address], [place], [place] [address]    Report Status 07/10/2017 FINAL  Final  Culture, blood (routine x 2)     Status: None   Collection Time: 07/08/17 11:18 AM  Result Value Ref Range Status   Specimen Description BLOOD RIGHT HAND  Final   Special Requests   Final    BOTTLES DRAWN AEROBIC ONLY Blood Culture adequate volume   Culture   Final    NO GROWTH 5 DAYS Performed at [facility], [place] [address], [place], [place] [address]    Report Status 07/13/2017 FINAL   Final  Culture, blood (routine x 2)     Status: None   Collection Time: 07/08/17 11:28 AM  Result Value Ref Range Status   Specimen Description BLOOD RIGHT HAND  Final   Special Requests   Final    BOTTLES DRAWN AEROBIC ONLY Blood Culture adequate volume   Culture   Final    NO GROWTH 5 DAYS Performed at [facility], [place] [address], [place],  [address]    Report Status 07/13/2017 FINAL  Final  Respiratory Panel by PCR     Status: None   Collection Time: 07/10/17 11:17 AM  Result Value Ref Range Status   Adenovirus NOT DETECTED NOT  DETECTED Final   Coronavirus 229E NOT DETECTED NOT DETECTED Final   Coronavirus HKU1 NOT DETECTED NOT DETECTED Final   Coronavirus NL63 NOT DETECTED NOT DETECTED Final   Coronavirus OC43 NOT DETECTED NOT DETECTED Final   Metapneumovirus NOT DETECTED NOT DETECTED Final   Rhinovirus / Enterovirus NOT DETECTED NOT DETECTED Final   Influenza A NOT DETECTED NOT DETECTED Final   Influenza B NOT DETECTED NOT DETECTED Final   Parainfluenza Virus 1 NOT DETECTED NOT DETECTED Final   Parainfluenza Virus 2 NOT DETECTED NOT DETECTED Final   Parainfluenza Virus 3 NOT DETECTED NOT DETECTED Final   Parainfluenza Virus 4 NOT DETECTED NOT DETECTED Final   Respiratory Syncytial Virus NOT DETECTED NOT DETECTED Final   Bordetella pertussis NOT DETECTED NOT DETECTED Final   Chlamydophila pneumoniae NOT DETECTED NOT DETECTED Final   Mycoplasma pneumoniae NOT DETECTED NOT DETECTED Final    Comment: Performed at [facility], [place] [address] [place], [place] [address]   *Note: Due to a large number of results and/or encounters for the requested time period, some results have not been displayed. A complete set of results can be found in Results Review.   BMET BMET    Component Value Date/Time   NA 146 (H) 07/16/2017 0303   NA 139 02/11/2017 1220   NA 140 08/16/2015 1554   K 3.7 07/16/2017 0303   K 3.8 08/16/2015 1554   CL 116 (H) 07/16/2017 0303    CO2 23 07/16/2017 0303   CO2 21 (L) 08/16/2015 1554   GLUCOSE 222 (H) 07/16/2017 0303   GLUCOSE 133 08/16/2015 1554   BUN 113 (H) 07/16/2017 0303   BUN 54 (H) 02/11/2017 1220   BUN 51.0 (H) 08/16/2015 1554   CREATININE 2.33 (H) 07/16/2017 0303   CREATININE 3.64 (H) 01/09/2016 1606   CREATININE 2.7 (H) 08/16/2015 1554   CALCIUM 8.3 (L) 07/16/2017 0303   CALCIUM NOPLAS 10/24/2015 1415   CALCIUM 8.9 08/16/2015 1554   GFRNONAA 27 (L) 07/16/2017 0303   GFRNONAA 27 (L) 07/20/2015 0949   GFRAA 32 (L) 07/16/2017 0303   GFRAA 31 (L) 07/20/2015 0949   CBC    Component Value Date/Time   WBC 7.1 07/16/2017 0303   RBC 2.99 (L) 07/16/2017 0303   HGB 8.0 (L) 07/16/2017 0303   HGB 8.0 (L) 02/11/2017 1220   HGB 7.3 (L) 01/30/2017 1223   HCT 26.4 (L) 07/16/2017 0303   HCT 26.4 (L) 02/11/2017 1220   HCT 24.1 (L) 01/30/2017 1223   PLT 122 (L) 07/16/2017 0303   PLT 368 02/11/2017 1220   MCV 88.3 07/16/2017 0303   MCV 73 (L) 02/11/2017 1220   MCV 77.0 (L) 01/30/2017 1223   MCH 26.8 07/16/2017 0303   MCHC 30.3 07/16/2017 0303   RDW 16.7 (H) 07/16/2017 0303   RDW 19.2 (H) 02/11/2017 1220   RDW 19.9 (H) 01/30/2017 1223   LYMPHSABS 0.5 (L) 07/16/2017 0303   LYMPHSABS 0.8 (L) 01/30/2017 1223   MONOABS 0.4 07/16/2017 0303   MONOABS 0.4 01/30/2017 1223   EOSABS 0.1 07/16/2017 0303   EOSABS 0.1 01/30/2017 1223   EOSABS 0.2 12/29/2016 1547   BASOSABS 0.0 07/16/2017 0303   BASOSABS 0.0 01/30/2017 1223    Best Practice/Protocols:  VTE Prophylaxis: Heparin (drip) Which has been stopped due to bleeding. ARDS  Studies: Echo 07/06/17 Study Conclusions  - Left ventricle: The cavity size was normal. Wall thickness was   increased in a pattern of mild LVH. Systolic function was  vigorous. The estimated ejection fraction was in the range of 65%   to 70%. Wall motion was normal; there were no regional wall   motion abnormalities. Features are consistent with a pseudonormal   left ventricular  filling pattern, with concomitant abnormal   relaxation and increased filling pressure (grade 2 diastolic   dysfunction). - Aortic valve: Valve area (VTI): 2.52 cm^2. Valve area (Vmax):   2.77 cm^2. Valve area (Vmean): 2.69 cm^2. - Mitral valve: Valve area by continuity equation (using LVOT   flow): 2.58 cm^2. - Tricuspid valve: There was mild regurgitation. - Pulmonary arteries: Systolic pressure was mildly to moderately   increased. PA peak pressure: 46 mm Hg (S).     Subjective:    Overnight Issues: Self extubated yesterday evening, tolerated well.  Remains on lasix gtt at 25mcg, good UOP at -2.7L past 24 hours and net -3.1L.  Objective:  Vital signs for last 24 hours: Temp:  [97.6 F (36.4 C)-98.5 F (36.9 C)] 98 F (36.7 C) (06/13 0740) Pulse Rate:  [71-86] 78 (06/13 0600) Resp:  [17-31] 26 (06/13 0600) BP: (150-179)/(51-69) 168/60 (06/13 0600) SpO2:  [85 %-100 %] 93 % (06/13 0757) FiO2 (%):  [30 %] 30 % (06/12 1530) Weight:  [94.7 kg (208 lb 12.4 oz)] 94.7 kg (208 lb 12.4 oz) (06/13 0452)  Hemodynamic parameters for last 24 hours: CVP:  [12 mmHg-13 mmHg] 13 mmHgHypertensive  Intake/Output from previous day: 06/12 0701 - 06/13 0700 In: 1616.6 [I.V.:252.5; NG/GT:1150; IV Piggyback:214.2] Out: 3040 [Urine:2765; Stool:275]  Intake/Output this shift: Total I/O In: 100 [NG/GT:100] Out: 350 [Urine:350]  Vent settings for last 24 hours: Vent Mode: CPAP;PSV FiO2 (%):  [30 %] 30 % [name]:  [5 cmH20] 5 cmH20 Pressure Support:  [5 cmH20] 5 cmH20  Physical Exam:  General: Adult male, resting in bed, in NAD. Neuro: Awake, answers questions appropriately and follows basic commands appropriately. HEENT: [place]/AT. Sclerae anicteric. Cardiovascular: RRR, no M/R/G.  Lungs: Respirations even and unlabored.  CTAB.  On HFNC at [facility]. Abdomen: BS x 4, soft, NT/ND.  Musculoskeletal: L AKA, no edema.  Skin: Intact, warm, no rashes.   Assessment/Plan:   NEURO  Somnolence. Acute  encephalopathy due to sedation - resolved.   Plan: Avoid sedating meds. Lights on during day, etc.  PULM  Acute Respiratory Failure (Due to CHF) - due to pulmonary edema from [facility].  Required intubation then self extubated 6/12.   Plan: Continue to wean O2 as able (currently on HF).  Push bronchial hygiene.  CARDIO  Acute Pulmonary Edema (due to left ventricular dysfunction) from diastolic heart failure.  HTN.   Plan: Continue to diuresis with furosemide infusion for more aggressive diuresis, will continue given good response.  Can transition to intermittent pushes maybe Friday 6/14. Continue antihypertensives.  Minoxidil added 6/12, will increase dose today 6/13 as need tighter control still.   RENAL  Acute on chronic renal failure secondary to uncontrolled hypertension.  Hypokalemia - s/p repletion.    Plan: Continue to diurese.  Continue home antihypertensives Follow BMP.  GI  Nutritional Deficiency Moderate   Plan: Swallow eval.  ID  No indication of infection.   Plan: Monitor clinically.  HEME  Anemia anemia of critical illness)   Plan: Transfuse for Hgb < 7.  ENDO Hyperglycemia - initially felt due to dextrose infusion; however, blood sugar remains uncontrolled off of D5W. Hypothyroidism.   Plan: Continue SSI and lantus. Continue synthroid.  Global Issues   S/p self extubation 6/12.  Has hypoxia  but slowly improving with ongoing diuresis.  Continue HFNC and wean as able.  Continue lasix infusion and transition to pushes over next 24 hours.  Can likely transfer out of ICU 6/14 if no acute events and vitals remain stable.  Full code.   LOS: 11 days   Additional comments:None   Montey Hora, [facility] Pager: [phone]  or [phone] 07/16/2017, 9:38 AM

## 2017-07-16 NOTE — Progress Notes (Addendum)
Nutrition Follow-up  DOCUMENTATION CODES:   Not applicable  INTERVENTION:  If SLP does not recommend diet, recommend: Cortrak placement Re-initiate Vital 1.2 @ 75 ml/hr (1800 ml/24 hrs) via cortrak. Total regimen providing 2160 kcal, 135 g protein, and 1458 ml H2O; meeting 100% of kcal and protein needs.  NUTRITION DIAGNOSIS:   Inadequate oral intake related to inability to eat as evidenced by NPO status.  Ongoing  GOAL:   Patient will meet greater than or equal to 90% of their needs  Ongoing - previously met with TF  MONITOR:   Vent status, Skin, TF tolerance, Weight trends, Labs, I & O's  REASON FOR ASSESSMENT:   Ventilator    ASSESSMENT:   67 y.o. M admitted on 07/05/17 for acute respiratory failure with hypoxia and CHF exacerbation. PMH of HTN, PAD, CHF, CAD, hx of skin cancer, T2DM, hx of gangrene of toe 2014, CKD stg IV, and anemia. 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 Pt intubate 6/5.   Per chart review weight stable since admission.  Pt self-extubated 6/12. Per Critical Care note, pt high risk for intubation.  Per RN pt ripped out cortrak in am, SLP to assess.  Pt awake, but lethargic. Sister at bedside.   Medications reviewed: coreg, catapres, cardura, ferrous sulfate, a[resoline, novolog 3-9 units, lantus 40 units, levothyroxine, lasix.   Labs reviewed: Na 146 (H), Cl 116 (H), BG 222 (H), BUN 113 (H), creatinine 2.33 (H), GFR 27 (L), RBC 2.99 (L), hemoglobin 8 (L), HCT 26.4 (L), platelets 122 (L).    NUTRITION - FOCUSED PHYSICAL EXAM:  No changes in fat/muscle wasting, no edema.   Diet Order:   Diet Order           Diet NPO time specified Except for: Sips with Meds  Diet effective now          EDUCATION NEEDS:   Not appropriate for education at this time  Skin:  Skin Assessment: Reviewed RN Assessment  Last BM:  6/12 (275 ml rectal tube)  Height:   Ht Readings from Last 1 Encounters:  07/05/17 '5\' 8"'  (1.727 m)    Weight:   Wt Readings from Last 1  Encounters:  07/16/17 208 lb 12.4 oz (94.7 kg)    Ideal Body Weight:  64.4 kg  BMI:  Body mass index is 31.74 kg/m.  Estimated Nutritional Needs:   Kcal:  2100-2300 kcal  Protein:  120-135 g  Fluid:  >/= 2.1 L or per MD    Hope Budds, Dietetic Intern

## 2017-07-16 NOTE — Progress Notes (Addendum)
Inpatient Diabetes Program Recommendations  AACE/ADA: New Consensus Statement on Inpatient Glycemic Control (2015)  Target Ranges:  Prepandial:   less than 140 mg/dL      Peak postprandial:   less than 180 mg/dL (1-2 hours)      Critically ill patients:  140 - 180 mg/dL   Lab Results  Component Value Date   GLUCAP 183 (H) 07/16/2017   HGBA1C 6.9 (H) 07/05/2017    Review of Glycemic Control Results for BRANNAN, CASSEDY (MRN [id number]) as of 07/16/2017 12:38  Ref. Range 07/16/2017 04:08 07/16/2017 07:41 07/16/2017 12:11  Glucose-Capillary Latest Ref Range: 65 - 99 mg/dL 211 (H) 250 (H) 183 (H)    Diabetes history:Type 2 DM Outpatient Diabetes medications:Novolog 15 units at lunch and supper Current orders for Inpatient glycemic control:Novolog 3-9 units Q4H, Lantus 40 units QD   Inpatient Diabetes Program Recommendations:    Recommend adding Novolog 3 units Q4H for tube feed coverage.   Thanks, Bronson Curb, MSN, RNC-OB Diabetes Coordinator [phone] (8a-5p)

## 2017-07-16 NOTE — Evaluation (Signed)
Clinical/Bedside Swallow Evaluation Patient Details  Name: Leonard [name] MRN: [id number] Date of Birth: [birth year]  Today's Date: 07/16/2017 Time: SLP Start Time (ACUTE ONLY): 1440 SLP Stop Time (ACUTE ONLY): 1455 SLP Time Calculation (min) (ACUTE ONLY): 15 min  Past Medical History:  Past Medical History:  Diagnosis Date  . Cancer ([place])    skin cancer-  . CHF (congestive heart failure) ([place])   . Chronic kidney disease    not on dialysis  . Coronary artery disease   . Gangrene of toe ([place])    right 5th/notes 01/04/2013   . High cholesterol   . Hypertension   . Iron deficiency anemia 08/03/2015  . 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 PAD (peripheral artery disease) ([place])   . Shortness of breath dyspnea    with exertion  . Type II diabetes mellitus ([place])    Type II   Past Surgical History:  Past Surgical History:  Procedure Laterality Date  . ABDOMINAL AORTOGRAM W/LOWER EXTREMITY N/A 03/12/2017   Procedure: ABDOMINAL AORTOGRAM W/LOWER EXTREMITY;  Surgeon: Conrad Canovanas, MD;  Location: [facility];  Service: Cardiovascular;  Laterality: N/A;  . AMPUTATION Right 01/28/2013   Procedure: RAY AMPUTATION RIGHT  4th & 5th TOE;  Surgeon: Rosetta Posner, MD;  Location: [facility];  Service: Vascular;  Laterality: Right;  . AMPUTATION Left 03/15/2017   Procedure: AMPUTATION ABOVE KNEE;  Surgeon: Waynetta Sandy, MD;  Location: [place] [facility];  Service: Vascular;  Laterality: Left;  . ANGIOPLASTY / STENTING FEMORAL Right 01/13/2013   superficial femoral artery x 2 (3 mm x 60 mm, 4 mm x 60 mm)  . AV FISTULA PLACEMENT Left 02/11/2016   Procedure: LEFT UPPER ARM BRACHIAL CEPHALIC ARTERIOVENOUS (AV) FISTULA CREATION;  Surgeon: Conrad Berlin, MD;  Location: [place];  Service: Vascular;  Laterality: Left;  . COLONOSCOPY W/ POLYPECTOMY    . CORONARY ARTERY BYPASS GRAFT N/A 04/07/2013   Procedure: CORONARY ARTERY BYPASS GRAFTING (CABG);  Surgeon: Ivin Poot, MD;   Location: [facility];  Service: Open Heart Surgery;  Laterality: N/A;  . INTRAOPERATIVE TRANSESOPHAGEAL ECHOCARDIOGRAM N/A 04/07/2013   Procedure: INTRAOPERATIVE TRANSESOPHAGEAL ECHOCARDIOGRAM;  Surgeon: Ivin Poot, MD;  Location: [place];  Service: Open Heart Surgery;  Laterality: N/A;  . LEFT HEART CATHETERIZATION WITH CORONARY ANGIOGRAM N/A 04/05/2013   Procedure: LEFT HEART CATHETERIZATION WITH CORONARY ANGIOGRAM;  Surgeon: Peter M Martinique, MD;  Location[facility] LAB;  Service: Cardiovascular;  Laterality: N/A;  . LOWER EXTREMITY ANGIOGRAM Bilateral 01/06/2013   Procedure: LOWER EXTREMITY ANGIOGRAM;  Surgeon: Conrad [place], MD;  Location: [facility] LAB;  Service: Cardiovascular;  Laterality: Bilateral;  . LOWER EXTREMITY ANGIOGRAM Right 01/13/2013   Procedure: LOWER EXTREMITY ANGIOGRAM;  Surgeon: Conrad Liberty, MD;  Location: [facility] CATH LAB;  Service: Cardiovascular;  Laterality: Right;  . SKIN CANCER EXCISION  2017  . THROAT SURGERY  1983   polyps  . TONSILLECTOMY AND ADENOIDECTOMY  ~ 19645   HPI:  67 year old man with a history of diabetes, peripheral vascular disease, hypertension and chronic kidney disease.  He was admitted with orthopnea that progressed quickly to fulminant respiratory failure.  He required mechanical ventilation 6/5 and for a period of time was on high [name], neuromuscular blockade.  He has been aggressively diuresed and blood pressure tightly controlled.  He self extubated and 6/12 and has tolerated. Pulled Cortrak.    Assessment / Plan / Recommendation Clinical Impression  Pt demonstrates signs concerning for  aspiration following sips of thin water. Swallow subjectively appears delayed and is followed by delayed, wet coughing. Pt was also given 2 oz of puree without resulting in coughing or wet vocal quality. Pt was mildly dyspneic during exam though sats and RR are WNL; breathing was effortful and pt often needed to take a breath with PO in his mouth. Pt is also observed to have  slightly breathy vocal quality, though cough was strong. Expect that pt may make rapid improvement in airway protection with a little more time s/p extubation. Pt should remain NPO but may take medications in puree, whole or crushed; SLP will f/u for further trials for PO readiness or need for objective testing.  SLP Visit Diagnosis: Dysphagia, oropharyngeal phase (R13.12)    Aspiration Risk  Moderate aspiration risk    Diet Recommendation NPO except meds   Medication Administration: Whole meds with liquid Supervision: Staff to assist with self feeding Compensations: Slow rate;Small sips/bites Postural Changes: Seated upright at 90 degrees    Other  Recommendations     Follow up Recommendations Skilled Nursing facility      Frequency and Duration min 2x/week  2 weeks       Prognosis Prognosis for Safe Diet Advancement: Good      Swallow Study   General HPI: 67 year old man with a history of diabetes, peripheral vascular disease, hypertension and chronic kidney disease.  He was admitted with orthopnea that progressed quickly to fulminant respiratory failure.  He required mechanical ventilation 6/5 and for a period of time was on high [name], neuromuscular blockade.  He has been aggressively diuresed and blood pressure tightly controlled.  He self extubated and 6/12 and has tolerated. Pulled Cortrak.  Type of Study: Bedside Swallow Evaluation Previous Swallow Assessment: none Diet Prior to this Study: NPO Temperature Spikes Noted: No Respiratory Status: Nasal cannula History of Recent Intubation: Yes Length of Intubations (days): 8 days Date extubated: 07/15/17 Behavior/Cognition: Cooperative;Alert;Requires cueing Oral Cavity Assessment: Within Functional Limits Oral Care Completed by SLP: Recent completion by staff Oral Cavity - Dentition: Edentulous Vision: Functional for self-feeding Self-Feeding Abilities: Needs assist Patient Positioning: Upright in bed Baseline Vocal  Quality: Low vocal intensity;Hoarse;Breathy Volitional Cough: Strong Volitional Swallow: Able to elicit    Oral/Motor/Sensory Function Overall Oral Motor/Sensory Function: Within functional limits   Ice Chips Ice chips: Impaired Presentation: Spoon Pharyngeal Phase Impairments: Cough - Delayed(did not initiate swallow)   Thin Liquid Thin Liquid: Impaired Presentation: Cup Pharyngeal  Phase Impairments: Suspected delayed Swallow;Cough - Immediate    Nectar Thick Nectar Thick Liquid: Not tested   Honey Thick Honey Thick Liquid: Not tested   Puree Puree: Within functional limits Presentation: Finger Lemmie Vanlanen, [place] CCC-SLP [phone]  Lynann Beaver 07/16/2017,4:00 PM

## 2017-07-16 NOTE — Care Management Note (Addendum)
Case Management Note Marvetta Gibbons RN,BSN Unit [facility] Case Manager  [phone]  Patient Details  Name: Leonard [name] MRN: [id number] Date of Birth: 1950-12-07  Subjective/Objective:  Pt admitted with acute hypoxic respiratory failure secondary to acute decompensated diastolic heart failure. Pt continued to worsen despite BiPAP.  He was eventually intubated, self ext. 6/12- on HFNC                Action/Plan: PTA pt lived at home, CM to follow for transition of care needs, PT eval ordered and pending on 6/13  Expected Discharge Date:                  Expected Discharge Plan:     In-House Referral:     Discharge planning Services  CM Consult  Post Acute Care Choice:    Choice offered to:     DME Arranged:    DME Agency:     HH Arranged:    [place] Agency:     Status of Service:  In process, will continue to follow  If discussed at Long Length of Stay Meetings, dates discussed:    Discharge Disposition:   Additional Comments:  Dawayne Patricia, RN 07/16/2017, 11:20 AM

## 2017-07-16 NOTE — Progress Notes (Addendum)
Pt pulled out cortrak feeding tube. Awaiting speech evaluation today before attempting to insert NG tube. Montey Hora, PA aware.

## 2017-07-17 ENCOUNTER — Inpatient Hospital Stay (HOSPITAL_COMMUNITY): Payer: Medicare Other

## 2017-07-17 LAB — BASIC METABOLIC PANEL
Anion gap: 9 (ref 5–15)
BUN: 106 mg/dL — AB (ref 6–20)
CO2: 22 mmol/L (ref 22–32)
Calcium: 8.7 mg/dL — ABNORMAL LOW (ref 8.9–10.3)
Chloride: 118 mmol/L — ABNORMAL HIGH (ref 101–111)
Creatinine, Ser: 2.18 mg/dL — ABNORMAL HIGH (ref 0.61–1.24)
GFR calc Af Amer: 35 mL/min — ABNORMAL LOW (ref >60)
GFR calc non Af Amer: 30 mL/min — ABNORMAL LOW (ref >60)
GLUCOSE: 104 mg/dL — AB (ref 65–99)
POTASSIUM: 3.6 mmol/L (ref 3.5–5.1)
Sodium: 149 mmol/L — ABNORMAL HIGH (ref 135–145)

## 2017-07-17 LAB — CBC WITH DIFFERENTIAL/PLATELET
Abs Immature Granulocytes: 0.1 10*3/uL (ref 0.0–0.1)
Basophils Absolute: 0 10*3/uL (ref 0.0–0.1)
Basophils Relative: 0 %
EOS ABS: 0.2 10*3/uL (ref 0.0–0.7)
Eosinophils Relative: 4 %
HEMATOCRIT: 26.2 % — AB (ref 39.0–52.0)
Hemoglobin: 8.2 g/dL — ABNORMAL LOW (ref 13.0–17.0)
IMMATURE GRANULOCYTES: 2 %
LYMPHS ABS: 0.7 10*3/uL (ref 0.7–4.0)
Lymphocytes Relative: 11 %
MCH: 26.9 pg (ref 26.0–34.0)
MCHC: 31.3 g/dL (ref 30.0–36.0)
MCV: 85.9 fL (ref 78.0–100.0)
MONO ABS: 0.4 10*3/uL (ref 0.1–1.0)
MONOS PCT: 7 %
NEUTROS PCT: 76 %
Neutro Abs: 4.6 10*3/uL (ref 1.7–7.7)
Platelets: 146 10*3/uL — ABNORMAL LOW (ref 150–400)
RBC: 3.05 MIL/uL — ABNORMAL LOW (ref 4.22–5.81)
RDW: 16.7 % — AB (ref 11.5–15.5)
WBC: 6 10*3/uL (ref 4.0–10.5)

## 2017-07-17 LAB — GLUCOSE, CAPILLARY
GLUCOSE-CAPILLARY: 90 mg/dL (ref 65–99)
GLUCOSE-CAPILLARY: 125 mg/dL — AB (ref 65–99)
Glucose-Capillary: 122 mg/dL — ABNORMAL HIGH (ref 65–99)
Glucose-Capillary: 169 mg/dL — ABNORMAL HIGH (ref 65–99)
Glucose-Capillary: 86 mg/dL (ref 65–99)

## 2017-07-17 LAB — MAGNESIUM: Magnesium: 2.4 mg/dL (ref 1.7–2.4)

## 2017-07-17 LAB — PHOSPHORUS: Phosphorus: 3.3 mg/dL (ref 2.5–4.6)

## 2017-07-17 MED ORDER — VITAL AF 1.2 CAL PO LIQD
1000.0000 mL | ORAL | Status: DC
  Administered 2017-07-17: 1000 mL
  Filled 2017-07-17 (×3): qty 1000

## 2017-07-17 MED ORDER — IPRATROPIUM-ALBUTEROL 0.5-2.5 (3) MG/3ML IN SOLN
3.0000 mL | Freq: Three times a day (TID) | RESPIRATORY_TRACT | Status: DC
  Administered 2017-07-18: 3 mL via RESPIRATORY_TRACT
  Filled 2017-07-17: qty 3

## 2017-07-17 MED ORDER — JEVITY 1.2 CAL PO LIQD: 1000.0000 mL | ORAL | Status: DC

## 2017-07-17 MED ORDER — FUROSEMIDE 10 MG/ML IJ SOLN
40.0000 mg | Freq: Once | INTRAMUSCULAR | Status: AC
  Administered 2017-07-17: 40 mg via INTRAVENOUS
  Filled 2017-07-17: qty 4

## 2017-07-17 MED ORDER — FREE WATER
250.0000 mL | Freq: Three times a day (TID) | Status: DC
  Administered 2017-07-17 – 2017-07-18 (×3): 250 mL

## 2017-07-17 NOTE — Progress Notes (Signed)
Called [facility] for report, charge RN to call back when she is not with a patient. Modena Morrow E, RN 07/17/2017 1:02 PM

## 2017-07-17 NOTE — Progress Notes (Signed)
Attempt to call report again, left on hold for multiple minutes. Modena Morrow E, RN 07/17/2017 1:39 PM

## 2017-07-17 NOTE — Progress Notes (Addendum)
Nutrition Follow-up  DOCUMENTATION CODES:   Not applicable  INTERVENTION:  Re-initiate Vital1.2@ 33m/hr (18030m24 hrs) via cortrak. Total regimen providing 2160kcal, 135g protein, and 145836m2O; meeting 100%of kcal and protein needs.  Provide 250m13mH to meet fluid needs.   NUTRITION DIAGNOSIS:   Inadequate oral intake related to inability to eat as evidenced by NPO status.  Ongoing  GOAL:   Patient will meet greater than or equal to 90% of their needs  Ongoing- previously met by TF  MONITOR:   Vent status, Skin, TF tolerance, Weight trends, Labs, I & O's  REASON FOR ASSESSMENT:   Ventilator    ASSESSMENT:   67 y57. M admitted on 07/05/17 for acute respiratory failure with hypoxia and CHF exacerbation. PMH of HTN, PAD, CHF, CAD, hx of skin cancer, T2DM, hx of gangrene of toe 2014, CKD stg IV, and anemia. 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 Pt intubate 6/5.   6/12 pt self-extubated 6/14 cortrak consult for tube replacement  Per SLP note and MBS; pt at risk for aspiration, NPO except for meds.  Spoke with NP; can restart TF and put in order when cortrak replaced.   Pt awake and alert, reports feeling ok today, no N/V. Hypernatremia: Na 149 (H); FW deficit 3.5 L  Per redo NFPE 6/13, no new depletions found (pt with some mild depletions), pt with significantly less edema. Per chart pt weight stable since admission with some fluctuations; suspect due to fluid changes. Pt weight on 6/13 -208 lbs, today 199 lbs.  Medications reviewed: coreg, catapres, cardura, ferrous sulfate, heparin, apresoline, novolog 0-20 units, lantus 40 units, isordil, levothyroxine, loniten, lasix.   Labs reviewed: Na 149 (H), CL 118 (H), BG 104 (H), BUN 106 (H), creatinine 2.18 (H), GFR 30 (L), RBC 3.05 (L), hemoglobin 8.2 (L), HCT 26.2 (L), platelets 146 (L).   Diet Order:   Diet Order           Diet NPO time specified  Diet effective now          EDUCATION NEEDS:   Not appropriate  for education at this time  Skin:  Skin Assessment: Reviewed RN Assessment  Last BM:  6/12 (275 ml rectal tube)  Height:   Ht Readings from Last 1 Encounters:  07/05/17 '5\' 8"'  (1.727 m)    Weight:   Wt Readings from Last 1 Encounters:  07/17/17 199 lb 4.7 oz (90.4 kg)    Ideal Body Weight:  64.4 kg  BMI:  Body mass index is 30.3 kg/m.  Estimated Nutritional Needs:   Kcal:  2100-2300 kcal  Protein:  120-135 g  Fluid:  >/= 2.1 L or per MD   MeliHope [employer]setetic Intern

## 2017-07-17 NOTE — Progress Notes (Signed)
Called Sister, [name], and updated her on the patent's transfer to another unit and gave her a summary of the patient's day. Report also called to [facility] [place] and SWOT to take to new room after Modified Barium Swallow test is finished. Modena Morrow E, RN 07/17/2017 1:52 PM

## 2017-07-17 NOTE — Progress Notes (Signed)
FPTS Social Note  Mr. Boyte is on HFNC today with some improvement in mental status.  We appreciate the excellent care provided by CCM and will resume care after patient is transferred to the floor.

## 2017-07-17 NOTE — Progress Notes (Addendum)
Patient arrived to unit at approximately 1414 via 1 nurse transport via bed. Patient alert and verbal. O2 @ 15 L/min via nasal cannula. HOB elevated to 30 degrees. Soft mitts in place due to patient attempts and successful removal of tubes. Patient assessed via 2 nurses for skin integrity issues. Sacral foam dressing in place to sacrum with old scar tissue noted to sacrum/coccyx area. No broken skin issues. Red bruise noted to left lower abdomen. Left AKA (February 2019) noted with well healed incision. F/C intact with penile edema noted and draining yellow straw colored urine. Flexiseal in place to rectal area with soft/liquid stool noted in tubing. No acute distress. B/P checked following placement of Core track with 665 systolic noted. MD called to confirm medications with NPO status following patient failed Modified Barium Swallow study. MD asked this nurse to contact pharmacy to adjust medication orders to be administered via Core track tubing. Pharmacy notified [facility]). Tube feeding of Vital 1.2 infusing via Core track @ 75 mL/hour with free water flushes set at 250 mL Q 8 hours. HOB elevated to 45 degrees. Patient tolerated well. AV fistula assessed to left arm/AC area with positive Bruit/Thrill.

## 2017-07-17 NOTE — Evaluation (Signed)
Physical Therapy Evaluation Patient Details Name: Leonard [name] MRN: [id number] DOB: 06/26/1950 Today's Date: 07/17/2017   History of Present Illness  Leonard [name] is a 67 y.o. male presenting with SOB, acute hypoxic respiratory failure secondary to acute decompensated diastolic heart failure. Intubated, extubated on 6/12, on high flow O2 currently.  PMH is significant for L AKA, COPD, CKD stage 4, CAD s/p CABG, T2DM, HTN, HLD.   Clinical Impression  Pt admitted with above diagnosis. Pt currently with functional limitations due to the deficits listed below (see PT Problem List). Pt unable to provide history, per RN patient [facility] at [facility]. Upon eval pt presents with lethargy, confusion, generalized weakness. Pt denies walking PTA. Patient is currently max A for all bed mobility and unable to safely transfer without life equipment at this time. PT will cont to follow and progress as tolerated.  Pt will benefit from skilled PT to increase their independence and safety with mobility to allow discharge to the venue listed below.       Follow Up Recommendations SNF;Supervision/Assistance - 24 hour    Equipment Recommendations  (TBD next venue)    Recommendations for Other Services OT consult     Precautions / Restrictions Precautions Precautions: Fall Restrictions Weight Bearing Restrictions: No      Mobility  Bed Mobility Overal bed mobility: Needs Assistance Bed Mobility: Rolling;Sidelying to Sit;Supine to Sit Rolling: Max assist Sidelying to sit: Max assist Supine to sit: Max assist     General bed mobility comments: Max A to assist patient to sitting   Transfers Overall transfer level: Needs assistance Equipment used: 2 person hand held assist Transfers: Sit to/from [facility] Sit to Stand: Total assist Stand pivot transfers: Total assist       General transfer comment: Pt unable to safely follow commands and intiate standing at this time due to  confusion.   Ambulation/Gait             General Gait Details: unable   Stairs            Wheelchair Mobility    Modified Rankin (Stroke Patients Only)       Balance Overall balance assessment: Needs assistance   Sitting balance-Leahy Scale: Poor Sitting balance - Comments: patient unable to hold himself up in sitting for longer than 5 seconds, lateral and posterior leaning  Postural control: Posterior lean;Right lateral lean;Left lateral lean   Standing balance-Leahy Scale: Zero                               Pertinent Vitals/Pain Pain Assessment: No/denies pain    Home Living Family/patient expects to be discharged to:: Skilled nursing facility                      Prior Function Level of Independence: (pet RN, patient at SNF PTA, pt unable to provide history)               Hand Dominance        Extremity/Trunk Assessment   Upper Extremity Assessment Upper Extremity Assessment: Generalized weakness    Lower Extremity Assessment Lower Extremity Assessment: Generalized weakness(L AKA)       Communication   Communication: No difficulties  Cognition Arousal/Alertness: Lethargic Behavior During Therapy: Flat affect;Impulsive Overall Cognitive Status: No family/caregiver present to determine baseline cognitive functioning  General Comments: Patient confused, impulsive, following commands 50% of the time.       General Comments      Exercises     Assessment/Plan    PT Assessment Patient needs continued PT services  PT Problem List Decreased strength;Decreased range of motion;Decreased activity tolerance;Decreased balance;Decreased mobility;Decreased coordination;Decreased cognition;Decreased knowledge of use of DME;Decreased safety awareness;Pain       PT Treatment Interventions DME instruction;Gait training;Stair training;Functional mobility training;Therapeutic  activities;Therapeutic exercise;Balance training    PT Goals (Current goals can be found in the Care Plan section)  Acute Rehab PT Goals Patient Stated Goal: non stated PT Goal Formulation: Patient unable to participate in goal setting Time For Goal Achievement: 07/31/17 Potential to Achieve Goals: Fair    Frequency Min 2X/week   Barriers to discharge        Co-evaluation               AM-PAC PT "6 Clicks" Daily Activity  Outcome Measure Difficulty turning over in bed (including adjusting bedclothes, sheets and blankets)?: Unable Difficulty moving from lying on back to sitting on the side of the bed? : Unable Difficulty sitting down on and standing up from a chair with arms (e.g., wheelchair, bedside commode, etc,.)?: Unable Help needed moving to and from a bed to chair (including a wheelchair)?: Total Help needed walking in hospital room?: Total Help needed climbing 3-5 steps with a railing? : Total 6 Click Score: 6    End of Session Equipment Utilized During Treatment: Gait belt;Oxygen Activity Tolerance: Patient tolerated treatment well Patient left: in bed;with call bell/phone within reach;with nursing/sitter in room Nurse Communication: Mobility status PT Visit Diagnosis: Muscle weakness (generalized) (M62.81);Unsteadiness on feet (R26.81)    Time: 1040-1110 PT Time Calculation (min) (ACUTE ONLY): 30 min   Charges:   PT Evaluation $PT Eval Moderate Complexity: 1 Mod PT Treatments $Therapeutic Activity: 8-22 mins   PT G Codes:        Reinaldo Berber, PT, DPT Acute Rehab Services Pager: [phone]    Reinaldo Berber 07/17/2017, 2:12 PM

## 2017-07-17 NOTE — Progress Notes (Signed)
Cortrak Tube Team Note:  Consult received to place a Cortrak feeding tube.   A 10 F Cortrak tube was placed in the RIGHT nare and secured with a nasal bridle at 90 cm. Per the Cortrak monitor reading the tube tip is post-pyloric, D-1.   No x-ray is required. RN may begin using tube.   If the tube becomes dislodged please keep the tube and contact the Cortrak team at [email or web address] (password [email or web address]) for replacement.  If after hours and replacement cannot be delayed, place a NG tube and confirm placement with an abdominal x-ray.    Gaynell Face, MS, RD, LDN Pager: [phone] Weekend/After Hours: [phone]

## 2017-07-17 NOTE — Progress Notes (Signed)
  Speech Language Pathology  Patient Details Name: Leonard [name] MRN: [id number] DOB: [birth year] Today's Date: 07/17/2017 Time:  -      Question if pt needed MBS following yesterday's BSE. SLP arrived and dietitian about to place Cortrak . Therapist reviewed chart, viewed pt to determine MBS is needed which can be completed today at 1330. RN in agreement with plan to hold off on Cortrak, perform MBS to determine swallow ability. If Cortrak needed, will page dietitian. RN also stated pt coughing some with meds crushed in applesauce.              GO                [name] 07/17/2017, 9:45 AM   [name] Caroli.[employer] [phone]

## 2017-07-17 NOTE — Progress Notes (Signed)
Follow up - Critical Care Medicine Note  Patient Details:    Leonard [name] is an 67 y.o. male.  He was originally admitted for acute hypoxic respiratory failure secondary to acute decompensated diastolic heart failure.  He continued to worsen despite BiPAP.  He was eventually intubated and required significant levels of [name] to improve oxygenation and neuromuscular blockade to improve ventilator synchrony. Self extubated on 6/12, has tolerated. Remains on lasix gtt, high flow O2 6/14  Lines, Airways, Drains: Airway 8 mm (Active)  Secured at (cm) 26 cm 07/13/2017 12:00 PM  Measured From Lips 07/13/2017 12:00 PM  Secured Location Right 07/13/2017 12:00 PM  Secured By [employer] 07/13/2017 11:15 AM  Tube Holder Repositioned Yes 07/13/2017 11:15 AM  Cuff Pressure (cm H2O) 26 cm H2O 07/13/2017  8:01 AM  Site Condition Dry 07/13/2017 11:15 AM     CVC Triple Lumen 07/08/17 Right Femoral (Active)  Indication for Insertion or Continuance of Line Vasoactive infusions 07/13/2017  8:00 AM  Site Assessment Clean;Dry;Intact 07/13/2017  8:00 AM  Proximal Lumen Status Infusing 07/13/2017  8:00 AM  Medial Lumen Status Infusing 07/13/2017  8:00 AM  Distal Lumen Status Infusing 07/13/2017  8:00 AM  Dressing Type Transparent;Occlusive 07/13/2017  8:00 AM  Dressing Status Clean;Dry;Intact;Antimicrobial disc in place 07/13/2017  8:00 AM  Line Care Connections checked and tightened 07/13/2017  8:00 AM  Dressing Intervention Dressing changed;Antimicrobial disc changed 07/08/2017  4:00 PM  Dressing Change Due 07/15/17 07/13/2017  8:00 AM     Rectal Tube/Pouch (Active)  Output (mL) 200 mL 07/13/2017  5:35 AM  Intake (mL) 100 mL 07/11/2017  9:00 PM     Urethral Catheter [place], RN Straight-tip 16 Fr. (Active)  Indication for Insertion or Continuance of Catheter Other (comment) 07/13/2017  7:29 AM  Site Assessment Clean;Intact 07/12/2017  8:00 PM  Catheter Maintenance Bag below level of bladder;Catheter  secured;Drainage bag/tubing not touching floor;Seal intact;No dependent loops;Insertion date on drainage bag 07/13/2017  8:00 AM  Collection Container Standard drainage bag 07/12/2017  8:00 PM  Securement Method Securing device (Describe) 07/12/2017  8:00 PM  Urinary Catheter Interventions Unclamped 07/12/2017  8:00 PM  Output (mL) 150 mL 07/13/2017  2:00 PM    Anti-infectives:  Anti-infectives (From admission, onward)   Start     Dose/Rate Route Frequency Ordered Stop   07/13/17 1000  azithromycin (ZITHROMAX) 200 MG/5ML suspension 250 mg  Status:  Discontinued     250 mg Per Tube Daily 07/12/17 1337 07/13/17 1011   07/10/17 1015  azithromycin (ZITHROMAX) 500 mg in sodium chloride 0.9 % 250 mL IVPB     500 mg 250 mL/hr over 60 Minutes Intravenous Every 24 hours 07/10/17 1014 07/12/17 1038   07/09/17 0930  vancomycin (VANCOCIN) IVPB 750 mg/150 ml premix  Status:  Discontinued     750 mg 150 mL/hr over 60 Minutes Intravenous Every 24 hours 07/08/17 0906 07/11/17 0837   07/08/17 1500  piperacillin-tazobactam (ZOSYN) IVPB 3.375 g  Status:  Discontinued     3.375 g 12.5 mL/hr over 240 Minutes Intravenous Every 8 hours 07/08/17 0906 07/13/17 1011   07/08/17 0930  vancomycin (VANCOCIN) IVPB 1000 mg/200 mL premix     1,000 mg 200 mL/hr over 60 Minutes Intravenous  Once 07/08/17 0906 07/08/17 1312   07/08/17 0930  piperacillin-tazobactam (ZOSYN) IVPB 3.375 g  Status:  Discontinued     3.375 g 100 mL/hr over 30 Minutes Intravenous  Once 07/08/17 0906 07/10/17 1051  Microbiology: Results for orders placed or performed during the hospital encounter of 07/05/17  MRSA PCR Screening     Status: None   Collection Time: 07/07/17  5:45 AM  Result Value Ref Range Status   MRSA by PCR NEGATIVE NEGATIVE Final    Comment:        The GeneXpert MRSA Assay (FDA approved for NASAL specimens only), is one component of a comprehensive MRSA colonization surveillance program. It is not intended to diagnose  MRSA infection nor to guide or monitor treatment for MRSA infections. Performed at [facility], [place] [address] [place], [place] [address]   Culture, blood (routine x 2)     Status: None   Collection Time: 07/07/17  7:57 AM  Result Value Ref Range Status   Specimen Description BLOOD RIGHT HAND PINKY SIDE  Final   Special Requests   Final    BOTTLES DRAWN AEROBIC ONLY Blood Culture results may not be optimal due to an inadequate volume of blood received in culture bottles   Culture   Final    NO GROWTH 5 DAYS Performed at [facility], [place] [address], [place], [place] [address]    Report Status 07/12/2017 FINAL  Final  Culture, blood (routine x 2)     Status: None   Collection Time: 07/07/17  8:05 AM  Result Value Ref Range Status   Specimen Description BLOOD RIGHT HAND THUMB SIDE  Final   Special Requests   Final    BOTTLES DRAWN AEROBIC ONLY Blood Culture results may not be optimal due to an inadequate volume of blood received in culture bottles   Culture   Final    NO GROWTH 5 DAYS Performed at [facility], [place] [address] [place], [place] [address]    Report Status 07/12/2017 FINAL  Final  Culture, respiratory (NON-Expectorated)     Status: None   Collection Time: 07/08/17 11:12 AM  Result Value Ref Range Status   Specimen Description TRACHEAL ASPIRATE  Final   Special Requests NONE  Final   Gram Stain   Final    FEW WBC PRESENT, PREDOMINANTLY PMN NO ORGANISMS SEEN    Culture   Final    Consistent with normal respiratory flora. Performed at [facility], [place] [address] [place], [place] [address]    Report Status 07/10/2017 FINAL  Final  Culture, blood (routine x 2)     Status: None   Collection Time: 07/08/17 11:18 AM  Result Value Ref Range Status   Specimen Description BLOOD RIGHT HAND  Final   Special Requests   Final    BOTTLES DRAWN AEROBIC ONLY Blood Culture adequate volume   Culture   Final    NO GROWTH 5 DAYS Performed at [facility], [place] [address], [place], [place] [address]    Report Status 07/13/2017 FINAL  Final  Culture, blood (routine x 2)     Status: None   Collection Time: 07/08/17 11:28 AM  Result Value Ref Range Status   Specimen Description BLOOD RIGHT HAND  Final   Special Requests   Final    BOTTLES DRAWN AEROBIC ONLY Blood Culture adequate volume   Culture   Final    NO GROWTH 5 DAYS Performed at [facility], [place] [address] [place], [place] [address]    Report Status 07/13/2017 FINAL  Final  Respiratory Panel by PCR     Status: None   Collection Time: 07/10/17  11:17 AM  Result Value Ref Range Status   Adenovirus NOT DETECTED NOT DETECTED Final   Coronavirus 229E NOT DETECTED NOT DETECTED Final   Coronavirus HKU1 NOT DETECTED NOT DETECTED Final   Coronavirus NL63 NOT DETECTED NOT DETECTED Final   Coronavirus OC43 NOT DETECTED NOT DETECTED Final   Metapneumovirus NOT DETECTED NOT DETECTED Final   Rhinovirus / Enterovirus NOT DETECTED NOT DETECTED Final   Influenza A NOT DETECTED NOT DETECTED Final   Influenza B NOT DETECTED NOT DETECTED Final   Parainfluenza Virus 1 NOT DETECTED NOT DETECTED Final   Parainfluenza Virus 2 NOT DETECTED NOT DETECTED Final   Parainfluenza Virus 3 NOT DETECTED NOT DETECTED Final   Parainfluenza Virus 4 NOT DETECTED NOT DETECTED Final   Respiratory Syncytial Virus NOT DETECTED NOT DETECTED Final   Bordetella pertussis NOT DETECTED NOT DETECTED Final   Chlamydophila pneumoniae NOT DETECTED NOT DETECTED Final   Mycoplasma pneumoniae NOT DETECTED NOT DETECTED Final    Comment: Performed at [facility], [place] [address], [place], [place] [address]   *Note: Due to a large number of results and/or encounters for the requested time period, some results have not been displayed. A complete set of results can be found in Results Review.   BMET BMET    Component Value Date/Time   NA 149 (H) 07/17/2017 0415   NA 139 02/11/2017 1220   NA 140  08/16/2015 1554   K 3.6 07/17/2017 0415   K 3.8 08/16/2015 1554   CL 118 (H) 07/17/2017 0415   CO2 22 07/17/2017 0415   CO2 21 (L) 08/16/2015 1554   GLUCOSE 104 (H) 07/17/2017 0415   GLUCOSE 133 08/16/2015 1554   BUN 106 (H) 07/17/2017 0415   BUN 54 (H) 02/11/2017 1220   BUN 51.0 (H) 08/16/2015 1554   CREATININE 2.18 (H) 07/17/2017 0415   CREATININE 3.64 (H) 01/09/2016 1606   CREATININE 2.7 (H) 08/16/2015 1554   CALCIUM 8.7 (L) 07/17/2017 0415   CALCIUM NOPLAS 10/24/2015 1415   CALCIUM 8.9 08/16/2015 1554   GFRNONAA 30 (L) 07/17/2017 0415   GFRNONAA 27 (L) 07/20/2015 0949   GFRAA 35 (L) 07/17/2017 0415   GFRAA 31 (L) 07/20/2015 0949   CBC    Component Value Date/Time   WBC 6.0 07/17/2017 0415   RBC 3.05 (L) 07/17/2017 0415   HGB 8.2 (L) 07/17/2017 0415   HGB 8.0 (L) 02/11/2017 1220   HGB 7.3 (L) 01/30/2017 1223   HCT 26.2 (L) 07/17/2017 0415   HCT 26.4 (L) 02/11/2017 1220   HCT 24.1 (L) 01/30/2017 1223   PLT 146 (L) 07/17/2017 0415   PLT 368 02/11/2017 1220   MCV 85.9 07/17/2017 0415   MCV 73 (L) 02/11/2017 1220   MCV 77.0 (L) 01/30/2017 1223   MCH 26.9 07/17/2017 0415   MCHC 31.3 07/17/2017 0415   RDW 16.7 (H) 07/17/2017 0415   RDW 19.2 (H) 02/11/2017 1220   RDW 19.9 (H) 01/30/2017 1223   LYMPHSABS 0.7 07/17/2017 0415   LYMPHSABS 0.8 (L) 01/30/2017 1223   MONOABS 0.4 07/17/2017 0415   MONOABS 0.4 01/30/2017 1223   EOSABS 0.2 07/17/2017 0415   EOSABS 0.1 01/30/2017 1223   EOSABS 0.2 12/29/2016 1547   BASOSABS 0.0 07/17/2017 0415   BASOSABS 0.0 01/30/2017 1223    Best Practice/Protocols:  VTE Prophylaxis: Heparin (drip) Which has been stopped due to bleeding. ARDS  Studies: Echo 07/06/17 Study Conclusions  - Left ventricle: The cavity size was normal. Wall thickness  was   increased in a pattern of mild LVH. Systolic function was   vigorous. The estimated ejection fraction was in the range of 65%   to 70%. Wall motion was normal; there were no regional  wall   motion abnormalities. Features are consistent with a pseudonormal   left ventricular filling pattern, with concomitant abnormal   relaxation and increased filling pressure (grade 2 diastolic   dysfunction). - Aortic valve: Valve area (VTI): 2.52 cm^2. Valve area (Vmax):   2.77 cm^2. Valve area (Vmean): 2.69 cm^2. - Mitral valve: Valve area by continuity equation (using LVOT   flow): 2.58 cm^2. - Tricuspid valve: There was mild regurgitation. - Pulmonary arteries: Systolic pressure was mildly to moderately   increased. PA peak pressure: 46 mm Hg (S).     Subjective:    Overnight Issues:  Has remained extubated.  Some improving mental status and secretion clearance.  Remains on high flow oxygen, Lasix drip.  Now 8.7 L negative   Objective:  Vital signs for last 24 hours: Temp:  [97.9 F (36.6 C)-98.8 F (37.1 C)] 98.6 F (37 C) (06/14 0740) Pulse Rate:  [70-97] 81 (06/14 1030) Resp:  [23-40] 24 (06/14 1030) BP: (147-182)/(55-67) 147/58 (06/14 1030) SpO2:  [90 %-100 %] 96 % (06/14 1030) Weight:  [90.4 kg (199 lb 4.7 oz)] 90.4 kg (199 lb 4.7 oz) (06/14 0600)  Hemodynamic parameters for last 24 hours:  Intake/Output from previous day: 06/13 0701 - 06/14 0700 In: 483.3 [I.V.:130; NG/GT:300; IV Piggyback:53.3] Out: 5835 [Urine:5610; Stool:225]  Intake/Output this shift: Total I/O In: 150 [I.V.:150] Out: 700 [Urine:700]  Vent settings for last 24 hours:  NA  Physical Exam:  General: Awake, chronically ill-appearing, comfortable in bed, no distress Neuro: Wakes to voice, answers questions, follows basic commands HEENT: No oral lesions, poor dentition, peoples equal, reasonable cough but he does not appear to be clearing his secretions completely Cardiovascular: Regular, no murmur Lungs: No accessory muscle use, mostly clear, no wheezing Abdomen: Soft, nontender, positive bowel sounds Musculoskeletal: Left AKA, no significant edema or deformity Skin: No  rash   Assessment/Plan:   NEURO  Somnolence. Acute encephalopathy due to sedation, metabolic status-improved 5/72   Plan: Avoid sedating medications Encourage good sleep-wake cycle, lights on during the day, etc.  PULM  Acute Respiratory Failure (Due to CHF) - due to pulmonary edema from [facility].  Required intubation, then self extubated 6/12.   Plan:  Push pulmonary hygiene Diuresis as blood pressure and renal function will tolerate Blood pressure control as below Follow chest x-ray on 6/15  CARDIO  Acute Pulmonary Edema (due to left ventricular dysfunction) from diastolic heart failure.  HTN.   Plan: Transition Lasix drip to intermittent dosing, 40 mg twice daily Continue current antihypertensive regimen.  Need him to have enteral access so that we can continue his minoxidil, carvedilol, amlodipine, isosorbide Labetalol ordered as needed  RENAL  Acute on chronic renal failure secondary to uncontrolled hypertension.  Slight improvement last 24 hours based on serum creatinine Hypokalemia -improved Hypernatremia    Plan: Diuresis as renal function will tolerate as outlined above Replace electrolyte as indicated Likely will need to start some free water next 24 hours once he is able to take p.o. or has enteral access  GI  Nutritional Deficiency Moderate   Plan: Appreciate SLP input, scheduled for MBS today. If he does not pass a swallowing evaluation then we will place an NG tube, core track  ID  No indication of infection.  Plan: Monitor clinically.  HEME  Anemia anemia of critical illness)   Plan: Agree, transfuse for Hgb < 7.  ENDO Hyperglycemia - initially felt due to dextrose infusion; however, blood sugar remains uncontrolled off of D5W. Hypothyroidism.   Plan: Lantus 40 Sliding scale insulin as ordered Continue levothyroxine  Global Issues   Gaining some strength since self extubation on 6/12.  Remains on high flow oxygen, attempting to wean.  Needs to pass a  swallowing evaluation so that he can get his scheduled antihypertensives.  Should be able to back off his Lasix drip and convert to intermittent Lasix on 6/14.  Transition to stepdown status 6/14, ask TRH to assume his care as of 6/15 Full code.   LOS: 12 days   Additional comments:None   Baltazar Apo, MD, PhD 07/17/2017, 10:57 AM [facility] [phone] or if no answer [phone]

## 2017-07-17 NOTE — Plan of Care (Signed)
  Problem: Education: Goal: Knowledge of General Education information will improve Outcome: Progressing   Problem: Clinical Measurements: Goal: Ability to maintain clinical measurements within normal limits will improve Outcome: Progressing   Problem: Pain Managment: Goal: General experience of comfort will improve Outcome: Progressing   Problem: Health Behavior/Discharge Planning: Goal: Ability to manage health-related needs will improve Outcome: Not Progressing   Problem: Activity: Goal: Risk for activity intolerance will decrease Outcome: Not Progressing   Problem: Nutrition: Goal: Adequate nutrition will be maintained Outcome: Not Progressing

## 2017-07-17 NOTE — Progress Notes (Signed)
Modified Barium Swallow Progress Note  Patient Details  Name: JAQUIN COY MRN: [id number] Date of Birth: [birth year]  Today's Date: 07/17/2017  Modified Barium Swallow completed.  Full report located under Chart Review in the Imaging Section.  Brief recommendations include the following:  Clinical Impression  Pt is not safe for po's at this time following MBS given laryngeal penetration with honey thick and thin barium. Laryngeal elevation and epiglottic deflection decreased in addition to mildly delayed swallow initiation leading to mild-moderate vallecular residue that penetrated to vocal cords silently after the swallow. Thin barium also penetrated to the vocal cords without sensation and cued coughs/throat clears given throught the study were ineffective to clear vestibule. Reduced oral cohesion with delayed transit. Pt with decreased endurance, excessive O2 requirement and needs to obtain nutrition via Cortrak at present. ST will continue to work with pt to improve oral manipulation, strengthen cough to facilitate vocal cord adduction and clearance.     Swallow Evaluation Recommendations       SLP Diet Recommendations: NPO       Medication Administration: Via alternative means               Oral Care Recommendations: Oral care QID        [name] 07/17/2017,2:41 PM   [name] Caroli.Ed [employer] [phone]

## 2017-07-18 ENCOUNTER — Inpatient Hospital Stay (HOSPITAL_COMMUNITY): Payer: Medicare Other

## 2017-07-18 LAB — BASIC METABOLIC PANEL
Anion gap: 10 (ref 5–15)
BUN: 94 mg/dL — ABNORMAL HIGH (ref 6–20)
CALCIUM: 8.7 mg/dL — AB (ref 8.9–10.3)
CHLORIDE: 117 mmol/L — AB (ref 101–111)
CO2: 27 mmol/L (ref 22–32)
CREATININE: 2.11 mg/dL — AB (ref 0.61–1.24)
GFR calc non Af Amer: 31 mL/min — ABNORMAL LOW (ref >60)
GFR, EST AFRICAN AMERICAN: 36 mL/min — AB (ref >60)
Glucose, Bld: 120 mg/dL — ABNORMAL HIGH (ref 65–99)
Potassium: 3.1 mmol/L — ABNORMAL LOW (ref 3.5–5.1)
SODIUM: 154 mmol/L — AB (ref 135–145)

## 2017-07-18 LAB — GLUCOSE, CAPILLARY
GLUCOSE-CAPILLARY: 122 mg/dL — AB (ref 65–99)
GLUCOSE-CAPILLARY: 145 mg/dL — AB (ref 65–99)
GLUCOSE-CAPILLARY: 66 mg/dL (ref 65–99)
GLUCOSE-CAPILLARY: 66 mg/dL (ref 65–99)
GLUCOSE-CAPILLARY: 78 mg/dL (ref 65–99)
Glucose-Capillary: 118 mg/dL — ABNORMAL HIGH (ref 65–99)
Glucose-Capillary: 86 mg/dL (ref 65–99)
Glucose-Capillary: 83 mg/dL (ref 65–99)

## 2017-07-18 LAB — BLOOD GAS, ARTERIAL
Acid-Base Excess: 3.7 mmol/L — ABNORMAL HIGH (ref 0.0–2.0)
BICARBONATE: 27.1 mmol/L (ref 20.0–28.0)
Drawn by: 23703
O2 CONTENT: 15 L/min
O2 SAT: 87.4 %
PATIENT TEMPERATURE: 98.6
PO2 ART: 50.4 mmHg — AB (ref 83.0–108.0)
pCO2 arterial: 35.9 mmHg (ref 32.0–48.0)
pH, Arterial: 7.49 — ABNORMAL HIGH (ref 7.350–7.450)

## 2017-07-18 LAB — CBC
HEMATOCRIT: 26.5 % — AB (ref 39.0–52.0)
Hemoglobin: 8 g/dL — ABNORMAL LOW (ref 13.0–17.0)
MCH: 26.2 pg (ref 26.0–34.0)
MCHC: 30.2 g/dL (ref 30.0–36.0)
MCV: 86.9 fL (ref 78.0–100.0)
PLATELETS: 180 10*3/uL (ref 150–400)
RBC: 3.05 MIL/uL — ABNORMAL LOW (ref 4.22–5.81)
RDW: 16.9 % — ABNORMAL HIGH (ref 11.5–15.5)
WBC: 5.1 10*3/uL (ref 4.0–10.5)

## 2017-07-18 LAB — TROPONIN I: TROPONIN I: 0.04 ng/mL — AB (ref <0.03)

## 2017-07-18 LAB — BRAIN NATRIURETIC PEPTIDE: B Natriuretic Peptide: 386.7 pg/mL — ABNORMAL HIGH (ref 0.0–100.0)

## 2017-07-18 LAB — MAGNESIUM: MAGNESIUM: 2.5 mg/dL — AB (ref 1.7–2.4)

## 2017-07-18 LAB — PROCALCITONIN: Procalcitonin: 0.21 ng/mL

## 2017-07-18 LAB — LACTIC ACID, PLASMA: LACTIC ACID, VENOUS: 0.7 mmol/L (ref 0.5–1.9)

## 2017-07-18 LAB — AMMONIA: AMMONIA: 42 umol/L — AB (ref 9–35)

## 2017-07-18 MED ORDER — FUROSEMIDE 10 MG/ML IJ SOLN
20.0000 mg | Freq: Once | INTRAMUSCULAR | Status: AC
  Administered 2017-07-18: 20 mg via INTRAVENOUS

## 2017-07-18 MED ORDER — FREE WATER
200.0000 mL | Status: DC
  Administered 2017-07-19 – 2017-07-22 (×21): 200 mL

## 2017-07-18 MED ORDER — FUROSEMIDE 10 MG/ML IJ SOLN
INTRAMUSCULAR | Status: AC
  Filled 2017-07-18: qty 2

## 2017-07-18 MED ORDER — DEXTROSE 5 % IV SOLN
INTRAVENOUS | Status: DC
  Administered 2017-07-18 – 2017-07-23 (×4): via INTRAVENOUS

## 2017-07-18 MED ORDER — LEVOTHYROXINE SODIUM 100 MCG PO TABS
100.0000 ug | ORAL_TABLET | Freq: Every day | ORAL | Status: DC
  Administered 2017-07-19 – 2017-07-30 (×12): 100 ug
  Filled 2017-07-18 (×13): qty 1

## 2017-07-18 MED ORDER — POTASSIUM CHLORIDE 10 MEQ/100ML IV SOLN
10.0000 meq | INTRAVENOUS | Status: AC
  Administered 2017-07-18 – 2017-07-19 (×3): 10 meq via INTRAVENOUS
  Filled 2017-07-18 (×3): qty 100

## 2017-07-18 MED ORDER — SODIUM CHLORIDE 0.9 % IV SOLN
2.0000 g | Freq: Two times a day (BID) | INTRAVENOUS | Status: AC
  Administered 2017-07-18 – 2017-07-22 (×8): 2 g via INTRAVENOUS
  Filled 2017-07-18 (×10): qty 2

## 2017-07-18 MED ORDER — DEXTROSE 5 % IV SOLN
10.0000 mg/h | INTRAVENOUS | Status: DC
  Administered 2017-07-18: 10 mg/h via INTRAVENOUS
  Filled 2017-07-18: qty 20

## 2017-07-18 NOTE — Progress Notes (Signed)
FPTS Social Note  Leonard [name] on HFNC and is somnolent this morning.  Overnight had desaturations into the 70s.  Continuing to require intensive care.  Appreciate excellent care provided by CCM.  Will resume care after patient is transferred to floor.  Leonard Waymire [name], MD PGY-1, [facility] 07/18/2017 8:23 AM

## 2017-07-18 NOTE — Progress Notes (Signed)
I was called by RN to evaluate this patient for progressive hypoxia. At time of initial call, PCCM MD and NP both tied up with other critically ill patients. Therefore Rapid response RN presented to bedside and helped stabilize patient until PCCM could arrive. On chart review this is a 19ACZ with Diastolic CHF, DM, PAD, HTN, CKD, who was admitted with diastolic CHF exacerbation, requiring intubation. He was extubated 6/12 and transferred out of the ICU. Now patient more hypoxic was 88-91% on 15L O2. Previously patient had been on 15L O2 as well but Pox 93-95%.   CXR performed showed pulmonary edema. ABG shows no hypercapnea but did reveal hypoxia. Rapid response RN gave Lasix 20mg  IV once and started Hiflo.   At time of my exam patient sleeping, Pox 97% on 40% FIO2 40L Hiflo. Rhonchi b/l (worse L>R). He was very somnolent difficult to arouse but finally did wake up and answered questions. CXR on my review shows pulmonary edema and questionable bibasilar infiltrates vs atelectasis. It is not much changed compared to 6/13 but worse compared to 6/10. He is afebrile with no leukocytosis, so favor this is likely atelectasis. Start Chest PT. Had been on lasix gtt until yesterday which was then discontinued. Resume lasix gtt now. Review of MAR does not show that he has received any sedating medications, and ABG shows no hypercapnea. Will obtain EKG. Will check Ammonia, BNP, Procal, Lactate, and Trop. Hold tube feeds for now given worsening hypoxia. Patient stable to stay on stepdown unit for now. Day team consider consulting Cardiology heart failure team. PCCM will continue to follow.  60 minutes critical care time  Vernie Murders, MD Pulmonary & Critical Care Medicine Pager: [phone]

## 2017-07-18 NOTE — Significant Event (Addendum)
Rapid Response Event Note  Overview: Hypoxia  Initial Focused Assessment: Called by bedside RN about patient having oxygen saturations in the 80s. Per RN, PCCM MD was already called and asked that RR RN assess patient.   Upon arrival, patient's oxygen saturations were 86%-88% on 15L HFNC. Patient was not in respiratory distress, RR 18-22, lung sounds diminished throughout, coarse crackles on the bases, no increased work of breathing and no shortness of breath. HR in the 90s, SBP 160-170s, skin warm and dry, good capillary refill, + UO. MD placed orders for STAT ABG and CXR  Interventions: -- CXR >  Worsening basilar aeration with increasing pleural effusions and bibasilar opacities/atelectasis. Equivocal worsening of perihilar and upper lung zone airspace process that may represent pulmonary edema, pneumonia, ARDS or combination thereof. -- ABG > 7.49/35.9/50.4/27.1/87.4 -- MD updated on CXR and ABG, orders received for Lasix 20mg  IV and Heated HFNC. RT placed patient on HHFNC 70% on 40L and saturations improved to 95-97%. RT was able to wean this down to 40% on 40L. -- SBP in the 170s, RN to treat with PRN.  -- TF held for now. RN to check blood sugars as ordered.   Plan of Care: -- Monitor oxygen saturations  Event Summary:  Call Time: 243 Arrival Time: 245 End Time: 119

## 2017-07-18 NOTE — Progress Notes (Signed)
PCCM Progress Note  Admission date: 07/05/2017 CC: short of breath  HPI: 67 yo male presented with shortness of breath from CHF exacerbation, pleural effusion, aspiration pneumonia.  Tried on Bipap and high flow [place].    PMHx: CKD 4, CAD s/p CABG, DM, HTN, HLD, DVT on eliquis.  Subjective: Remains on high flow oxygen.  Noted to have fever overnight.  Vital signs: BP (!) 155/51   Pulse 79   Temp 99.8 F (37.7 C) (Axillary)   Resp (!) 26   Ht 5\' 8"  (1.727 m)   Wt 189 lb 13.1 oz (86.1 kg)   SpO2 96%   BMI 28.86 kg/m   Intake/output: I/O last 3 completed shifts: In: 1022 [I.V.:167; NG/GT:855] Out: 6710 [Urine:6410; Stool:300]  Physical exam:  General - somnolent Eyes - pupils reactive ENT - no stridor, cortrak in place Cardiac - regular, no murmur Chest - basilar crackls Abd - soft, non tender Ext - Lt AKA, no edema Skin - no rashes Neuro - follows simple commands  Labs: CBC Recent Labs    07/16/17 0303 07/17/17 0415 07/18/17 0507  WBC 7.1 6.0 5.1  HGB 8.0* 8.2* 8.0*  HCT 26.4* 26.2* 26.5*  PLT 122* 146* 180    Coag's No results for input(s): APTT, INR in the last 72 hours.  BMET Recent Labs    07/16/17 0303 07/17/17 0415 07/18/17 0507  NA 146* 149* 154*  K 3.7 3.6 3.1*  CL 116* 118* 117*  CO2 23 22 27   BUN 113* 106* 94*  CREATININE 2.33* 2.18* 2.11*  GLUCOSE 222* 104* 120*    Electrolytes Recent Labs    07/16/17 0303 07/17/17 0415 07/18/17 0507  CALCIUM 8.3* 8.7* 8.7*  MG 2.3 2.4 2.5*  PHOS 3.4 3.3  --     Sepsis Markers Recent Labs    07/18/17 0828  PROCALCITON 0.21    ABG Recent Labs    07/15/17 2046 07/18/17 0239  PHART 7.374 7.490*  PCO2ART 37.0 35.9  PO2ART 90.0 50.4*    Liver Enzymes No results for input(s): AST, ALT, ALKPHOS, BILITOT, ALBUMIN in the last 72 hours.  Cardiac Enzymes Recent Labs    07/18/17 0828  TROPONINI 0.04*    Glucose Recent Labs    07/17/17 1636 07/17/17 2028 07/18/17 0016  07/18/17 0415 07/18/17 0819 07/18/17 1126  GLUCAP 90 125* 145* 122* 118* 86    Imaging Dg Chest Port 1 View  Result Date: 07/18/2017 CLINICAL DATA:  Acute respiratory failure with hypoxia. EXAM: PORTABLE CHEST 1 VIEW COMPARISON:  Radiograph 07/16/2017 FINDINGS: Enteric tube in place tip below the diaphragm not included in the field of view. Post median sternotomy with cardiomegaly. Worsening hazy opacity at the lung bases likely combination of pleural fluid and atelectasis. Diffuse perihilar and upper lobe opacities with equivocal progression from prior exam. No pneumothorax. IMPRESSION: Worsening basilar aeration with increasing pleural effusions and bibasilar opacities/atelectasis. Stable cardiomegaly. Equivocal worsening of perihilar and upper lung zone airspace process that may represent pulmonary edema, pneumonia, ARDS or combination thereof. Electronically Signed   By: Jeb Levering M.D.   On: 07/18/2017 03:04   Dg Swallowing Func-speech Pathology  Result Date: 07/17/2017 Objective Swallowing Evaluation: Type of Study: MBS-Modified Barium Swallow Study  Patient Details Name: Leonard [name] MRN: [id number] Date of Birth: [birth year] Today's Date: 07/17/2017 Time: SLP Start Time (ACUTE ONLY): 1340 -SLP Stop Time (ACUTE ONLY): 1400 SLP Time Calculation (min) (ACUTE ONLY): 20 min Past Medical History: Past Medical History: Diagnosis Date . Cancer ([place])  skin cancer- . CHF (congestive heart failure) ([place])  . Chronic kidney disease   not on dialysis . Coronary artery disease  . Gangrene of toe ([place])   right 5th/notes 01/04/2013  . High cholesterol  . Hypertension  . Iron deficiency anemia 08/03/2015 . 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 PAD (peripheral artery disease) ([place])  . Shortness of breath dyspnea   with exertion . Type II diabetes mellitus ([place])   Type II Past Surgical History: Past Surgical History: Procedure Laterality Date . ABDOMINAL AORTOGRAM W/LOWER  EXTREMITY N/A 03/12/2017  Procedure: ABDOMINAL AORTOGRAM W/LOWER EXTREMITY;  Surgeon: Conrad Solon, MD;  Location: [facility];  Service: Cardiovascular;  Laterality: N/A; . AMPUTATION Right 01/28/2013  Procedure: RAY AMPUTATION RIGHT  4th & 5th TOE;  Surgeon: Rosetta Posner, MD;  Location: [facility];  Service: Vascular;  Laterality: Right; . AMPUTATION Left 03/15/2017  Procedure: AMPUTATION ABOVE KNEE;  Surgeon: Waynetta Sandy, MD;  Location: [facility];  Service: Vascular;  Laterality: Left; . ANGIOPLASTY / STENTING FEMORAL Right 01/13/2013  superficial femoral artery x 2 (3 mm x 60 mm, 4 mm x 60 mm) . AV FISTULA PLACEMENT Left 02/11/2016  Procedure: LEFT UPPER ARM BRACHIAL CEPHALIC ARTERIOVENOUS (AV) FISTULA CREATION;  Surgeon: Conrad [place], MD;  Location: [facility];  Service: Vascular;  Laterality: Left; . COLONOSCOPY W/ POLYPECTOMY   . CORONARY ARTERY BYPASS GRAFT N/A 04/07/2013  Procedure: CORONARY ARTERY BYPASS GRAFTING (CABG);  Surgeon: Ivin Poot, MD;  Location: [facility] [place];  Service: Open Heart Surgery;  Laterality: N/A; . INTRAOPERATIVE TRANSESOPHAGEAL ECHOCARDIOGRAM N/A 04/07/2013  Procedure: INTRAOPERATIVE TRANSESOPHAGEAL ECHOCARDIOGRAM;  Surgeon: Ivin Poot, MD;  Location: [facility];  Service: Open Heart Surgery;  Laterality: N/A; . LEFT HEART CATHETERIZATION WITH CORONARY ANGIOGRAM N/A 04/05/2013  Procedure: LEFT HEART CATHETERIZATION WITH CORONARY ANGIOGRAM;  Surgeon: Peter M Martinique, MD;  Location: [facility] C[facility] LAB;  Service: Cardiovascular;  Laterality: N/A; . LOWER EXTREMITY ANGIOGRAM Bilateral 01/06/2013  Procedure: LOWER EXTREMITY ANGIOGRAM;  Surgeon: Conrad De[place], MD;  Location: [facility] CATH LAB;  Service: Cardiovascular;  Laterality: Bilateral; . LOWER EXTREMITY ANGIOGRAM Right 01/13/2013  Procedure: LOWER EXTREMITY ANGIOGRAM;  Surgeon: Conrad [place], MD;  Location: [facility] CATH LAB;  Service: Cardiovascular;  Laterality: Right; . SKIN CANCER EXCISION  2017 . THROAT SURGERY  1983  polyps . TONSILLECTOMY AND  ADENOIDECTOMY  ~ 6013 HPI: 67 year old man with a history of diabetes, peripheral vascular disease, hypertension and chronic kidney disease.  He was admitted with orthopnea that progressed quickly to fulminant respiratory failure.  He required mechanical ventilation 6/5 and for a period of time was on high [name], neuromuscular blockade.  He has been aggressively diuresed and blood pressure tightly controlled.  He self extubated and 6/12 and has tolerated. Pulled Cortrak.  No data recorded Assessment / Plan / Recommendation CHL IP CLINICAL IMPRESSIONS 07/17/2017 Clinical Impression Pt is not safe for po's at this time following MBS given laryngeal penetration with honey thick and thin barium. Laryngeal elevation and epiglottic deflection decreased in addition to mildly delayed swallow initiation leading to mild-moderate vallecular residue that penetrated to vocal cords silently after the swallow. Thin barium also penetrated to the vocal cords without sensation and cued coughs/throat clears given throught the study were ineffective to clear vestibule. Reduced oral cohesion with delayed transit. Pt with decreased endurance, excessive O2 requirement and needs to obtain nutrition via Cortrak at present. ST will continue to work with pt to improve oral manipulation, strengthen cough  to facilitate vocal cord adduction and clearance.   SLP Visit Diagnosis Dysphagia, oropharyngeal phase (R13.12) Attention and concentration deficit following -- Frontal lobe and executive function deficit following -- Impact on safety and function Moderate aspiration risk;Severe aspiration risk   CHL IP TREATMENT RECOMMENDATION 07/17/2017 Treatment Recommendations Therapy as outlined in treatment plan below   Prognosis 07/17/2017 Prognosis for Safe Diet Advancement Good Barriers to Reach Goals -- Barriers/Prognosis Comment -- CHL IP DIET RECOMMENDATION 07/17/2017 SLP Diet Recommendations NPO Liquid Administration via -- Medication Administration  Via alternative means Compensations -- Postural Changes --   CHL IP OTHER RECOMMENDATIONS 07/17/2017 Recommended Consults -- Oral Care Recommendations Oral care QID Other Recommendations --   CHL IP FOLLOW UP RECOMMENDATIONS 07/17/2017 Follow up Recommendations Skilled Nursing facility   [facility] IP FREQUENCY AND DURATION 07/17/2017 Speech Therapy Frequency (ACUTE ONLY) min 2x/week Treatment Duration 2 weeks      CHL IP ORAL PHASE 07/17/2017 Oral Phase Impaired Oral - Pudding Teaspoon -- Oral - Pudding Cup -- Oral - Honey Teaspoon Delayed oral transit;Weak lingual manipulation Oral - Honey Cup Weak lingual manipulation;Delayed oral transit Oral - Nectar Teaspoon -- Oral - Nectar Cup -- Oral - Nectar Straw -- Oral - Thin Teaspoon -- Oral - Thin Cup Decreased bolus cohesion Oral - Thin Straw -- Oral - Puree Weak lingual manipulation Oral - Mech Soft -- Oral - Regular -- Oral - Multi-Consistency -- Oral - Pill -- Oral Phase - Comment --  CHL IP PHARYNGEAL PHASE 07/17/2017 Pharyngeal Phase Impaired Pharyngeal- Pudding Teaspoon -- Pharyngeal -- Pharyngeal- Pudding Cup -- Pharyngeal -- Pharyngeal- Honey Teaspoon Pharyngeal residue - valleculae;Pharyngeal residue - pyriform;Reduced laryngeal elevation;Delayed swallow initiation-pyriform sinuses Pharyngeal -- Pharyngeal- Honey Cup Pharyngeal residue - valleculae;Pharyngeal residue - pyriform;Penetration/Apiration after swallow;Reduced laryngeal elevation;Reduced epiglottic inversion Pharyngeal Material enters airway, CONTACTS cords and not ejected out Pharyngeal- Nectar Teaspoon -- Pharyngeal -- Pharyngeal- Nectar Cup -- Pharyngeal -- Pharyngeal- Nectar Straw -- Pharyngeal -- Pharyngeal- Thin Teaspoon -- Pharyngeal -- Pharyngeal- Thin Cup Pharyngeal residue - valleculae;Pharyngeal residue - pyriform;Penetration/Aspiration during swallow Pharyngeal Material enters airway, CONTACTS cords and not ejected out Pharyngeal- Thin Straw -- Pharyngeal -- Pharyngeal- Puree Pharyngeal residue -  valleculae;Pharyngeal residue - pyriform;Reduced laryngeal elevation;Reduced epiglottic inversion Pharyngeal -- Pharyngeal- Mechanical Soft -- Pharyngeal -- Pharyngeal- Regular -- Pharyngeal -- Pharyngeal- Multi-consistency -- Pharyngeal -- Pharyngeal- Pill -- Pharyngeal -- Pharyngeal Comment --  CHL IP CERVICAL ESOPHAGEAL PHASE 07/17/2017 Cervical Esophageal Phase WFL Pudding Teaspoon -- Pudding Cup -- Honey Teaspoon -- Honey Cup -- Nectar Teaspoon -- Nectar Cup -- Nectar Straw -- Thin Teaspoon -- Thin Cup -- Thin Straw -- Puree -- Mechanical Soft -- Regular -- Multi-consistency -- Pill -- Cervical Esophageal Comment -- No flowsheet data found. [name] 07/17/2017, 2:40 PM [name] Caroli.Ed CCC-SLP Pager [phone]               Studies: PFT 03/17/16 >> FEV1 2.25 (81%), FEV1% 88, TLC 3.70 (55%), DLCO 46% Echo 07/06/17 >> mild LVH, EF 65 to 70%, grade 2 DD, PAS 46 mmHg CT chest 07/11/17 >> interstitial edema, basilar consolidation, b/l effusions  Cultures: Blood 6/04 >> negative Sputum 6/05 >> negative Blood 6/05 >> negative Respiratory viral panel 6/07 >> negative  Antibiotics: Vancomycin 6/05 >> 6/09 Zosyn 6/05 >> 6/09 Tressie Ellis 6/15 >>   Discussion: 67 yo male with acute hypoxic respiratory failure.  Most of this seems to be related to aspiration pneumonia.  He has been diuresed, and now Na increasing.  Reported to  have COPD, but PFT from February 2018 not consistent with this.  Assessment/plan:  Acute hypoxic respiratory failure. - oxygen to keep SpO2 > 92%  HCAP. - restart ABx - f/u CXR  Dysphagia. - hold tube feedings for now - f/u with speech therapy  HFpEF, HLD. - hold lasix with increasing Na - norvasc, lipitor, coreg, catapres, cardura, hydralazine, minoxidil  Hypernatremia. CKD 4. - f/u BMET  Anemia of critical illness and chronic disease. - f/u CBC  DM type II. Hypothyroidism. - SSI with lantus - synthroid  Altered mental status. - CT head w/o  contrast  DVT prophylaxis - SQ heparin SUP - not indicated Nutrition - NPO except meds Goals of care - full code  Updated family at bedside  Chesley Mires, MD [place] 07/18/2017, 2:10 PM

## 2017-07-18 NOTE — Progress Notes (Signed)
Received critical lab Troponin 0.04, BNP 386.7. Tympanic temperature 100.2. Text page sent to MD for patient. Patient not exhibiting any acute distress.

## 2017-07-18 NOTE — Progress Notes (Signed)
Received call from Central telemetry @0210 . Patient's O2 sats were 79. Upon assessment, found the nasal cannula out of pt's nose. Readjusted the [place], applied new pulseox, repositioned pt in bed, sats remained in 80's. Notified Elink. Received call from Dr. Phylliss Bob, CXR and ABG ordered stat. MD requested rapid at bedside to assess patient. 20 mg IV lasixs and heated HFNC given. Patient's sats are now in mid 52s. labetalol given d/t elevated BP. Will continue to monitor

## 2017-07-18 NOTE — Progress Notes (Signed)
eLink Physician-Brief Progress Note [name] DOB: 03-04-1950 MRN: [id number]   Date of Service  07/18/2017  HPI/Events of Note  Hypoglycemia - Blood glucose = 78 --> 66.   eICU Interventions  Will order: 1. D5W to run IV at 50 mL/hour.      Intervention Category Major Interventions: Other:  Lysle Dingwall 07/18/2017, 10:38 PM

## 2017-07-18 NOTE — Progress Notes (Signed)
Hypernatremia worsening this AM (Na 149>>154); increase free water from 250cc q8hrs to 200cc q4hrs. Hypokalemia with K 3.1; conservatively replete potassium with 30 MEQ IV. I/O recorded as net negative 255cc so far this calendar day. Lasix on hold given increasing sodium. Unable to eval CVP as no CVC present. Will order new limited TTE to eval RV pressure and respiratory variation of IVC collapsibility.

## 2017-07-18 NOTE — Progress Notes (Signed)
Pharmacy Antibiotic Note  Leonard [name] is a 67 y.o. male admitted on 07/05/2017 with acute hypoxic respiratory failure with shortness of breath from CHF exacerbation. Noted to have fever overnight.  Pharmacy has been consulted for Ceftazidime dosing for HCAP. H/o CKD stage III SCr 2.11, estimated CrCl ~ 37 ml/min. UOP 1.6 ml/kg/hr WBC wnl and Tm 100.2, Tc =99.8  Plan: Ceftazidime 2 gm IV q12h Monitor clinical progress including renal function and cultures.  Height: 5\' 8"  (172.7 cm) Weight: 189 lb 13.1 oz (86.1 kg) IBW/kg (Calculated) : 68.4  Temp (24hrs), Avg:99.5 F (37.5 C), Min:98.4 F (36.9 C), Max:100.2 F (37.9 C)  Recent Labs  Lab 07/14/17 0343 07/15/17 0300 07/16/17 0303 07/17/17 0415 07/18/17 0507 07/18/17 0828  WBC 5.6 6.6 7.1 6.0 5.1  --   CREATININE 2.77* 2.64* 2.33* 2.18* 2.11*  --   LATICACIDVEN  --   --   --   --   --  0.7    Estimated Creatinine Clearance: 36.8 mL/min (A) (by C-G formula based on SCr of 2.11 mg/dL (H)).    No Known Allergies  Antimicrobials this admission:  Vanc 6/5 >>6/9 Zosyn 6/5 >>6/10 Azith 6/7 >>6/9 Ceftazidime 6/15>>   Microbiology results:  6/7 RVP neg 6/5 blood-neg 6/5 resp-negative 6/4 blood- neg 6/4 MRSA PCR- neg  Thank you for allowing pharmacy to be a part of this patient's care. Nicole Cella, RPh Clinical Pharmacist [place] [phone] 07/18/2017 3:01 PM

## 2017-07-19 ENCOUNTER — Inpatient Hospital Stay (HOSPITAL_COMMUNITY): Payer: Medicare Other

## 2017-07-19 DIAGNOSIS — I361 Nonrheumatic tricuspid (valve) insufficiency: Secondary | ICD-10-CM

## 2017-07-19 LAB — CBC
HCT: 26 % — ABNORMAL LOW (ref 39.0–52.0)
Hemoglobin: 7.9 g/dL — ABNORMAL LOW (ref 13.0–17.0)
MCH: 26.8 pg (ref 26.0–34.0)
MCHC: 30.4 g/dL (ref 30.0–36.0)
MCV: 88.1 fL (ref 78.0–100.0)
PLATELETS: 163 10*3/uL (ref 150–400)
RBC: 2.95 MIL/uL — AB (ref 4.22–5.81)
RDW: 17.2 % — AB (ref 11.5–15.5)
WBC: 5.7 10*3/uL (ref 4.0–10.5)

## 2017-07-19 LAB — COMPREHENSIVE METABOLIC PANEL
ALBUMIN: 2.6 g/dL — AB (ref 3.5–5.0)
ALK PHOS: 70 U/L (ref 38–126)
ALT: 11 U/L — AB (ref 17–63)
AST: 15 U/L (ref 15–41)
Anion gap: 11 (ref 5–15)
BUN: 81 mg/dL — ABNORMAL HIGH (ref 6–20)
CALCIUM: 8.2 mg/dL — AB (ref 8.9–10.3)
CO2: 24 mmol/L (ref 22–32)
CREATININE: 2.03 mg/dL — AB (ref 0.61–1.24)
Chloride: 117 mmol/L — ABNORMAL HIGH (ref 101–111)
GFR calc Af Amer: 38 mL/min — ABNORMAL LOW (ref >60)
GFR calc non Af Amer: 32 mL/min — ABNORMAL LOW (ref >60)
Glucose, Bld: 180 mg/dL — ABNORMAL HIGH (ref 65–99)
Potassium: 3.3 mmol/L — ABNORMAL LOW (ref 3.5–5.1)
SODIUM: 152 mmol/L — AB (ref 135–145)
Total Bilirubin: 0.7 mg/dL (ref 0.3–1.2)
Total Protein: 5.5 g/dL — ABNORMAL LOW (ref 6.5–8.1)

## 2017-07-19 LAB — BLOOD GAS, ARTERIAL
ACID-BASE EXCESS: 0.1 mmol/L (ref 0.0–2.0)
BICARBONATE: 23.9 mmol/L (ref 20.0–28.0)
DRAWN BY: 347621
FIO2: 0.8
O2 CONTENT: 40 L/min
O2 SAT: 97.4 %
PATIENT TEMPERATURE: 98.6
PO2 ART: 93.2 mmHg (ref 83.0–108.0)
pCO2 arterial: 36.4 mmHg (ref 32.0–48.0)
pH, Arterial: 7.432 (ref 7.350–7.450)

## 2017-07-19 LAB — PROCALCITONIN: Procalcitonin: 0.17 ng/mL

## 2017-07-19 LAB — GLUCOSE, CAPILLARY
GLUCOSE-CAPILLARY: 100 mg/dL — AB (ref 65–99)
GLUCOSE-CAPILLARY: 142 mg/dL — AB (ref 65–99)
GLUCOSE-CAPILLARY: 79 mg/dL (ref 65–99)
Glucose-Capillary: 90 mg/dL (ref 65–99)
Glucose-Capillary: 107 mg/dL — ABNORMAL HIGH (ref 65–99)
Glucose-Capillary: 116 mg/dL — ABNORMAL HIGH (ref 65–99)

## 2017-07-19 LAB — MAGNESIUM: Magnesium: 2.3 mg/dL (ref 1.7–2.4)

## 2017-07-19 MED ORDER — INSULIN GLARGINE 100 UNIT/ML ~~LOC~~ SOLN
20.0000 [IU] | Freq: Every day | SUBCUTANEOUS | Status: DC
  Administered 2017-07-19 – 2017-07-26 (×7): 20 [IU] via SUBCUTANEOUS
  Filled 2017-07-19 (×10): qty 0.2

## 2017-07-19 MED ORDER — VITAL HIGH PROTEIN PO LIQD
1000.0000 mL | ORAL | Status: DC
  Administered 2017-07-19: 1000 mL
  Filled 2017-07-19 (×3): qty 1000

## 2017-07-19 MED ORDER — POTASSIUM CHLORIDE 10 MEQ/100ML IV SOLN
10.0000 meq | INTRAVENOUS | Status: AC
  Administered 2017-07-19 (×3): 10 meq via INTRAVENOUS
  Filled 2017-07-19 (×3): qty 100

## 2017-07-19 NOTE — Progress Notes (Signed)
Patient restless and agitated. Patient continues to attempt to slide to edge of bed. Soft mitts in place. Patient noted to be grabbing at Core track and High flow nasal cannula. Patient repositioned by staff Q 2 hours and PRN. Patient informed of treatment and need for soft mitts. Education to patient given for reduction of use of mitts. Patient stated understanding, but continues to fidget with tubing. Text page to family medicine services. MD on route to unit to assess patient. Will continue to monitor behaviors and offer distraction. Television on and remote in reach.

## 2017-07-19 NOTE — Progress Notes (Signed)
Family Medicine Teaching Service Daily Progress Note Intern Pager: [phone]  [name] Medical record number: [id number] Date of birth: [birth year] Age: 66 y.o. Gender: male  Primary Care Provider: [name], MD Consultants: none Code Status: full  Pt Overview and Major Events to Date:  Admitted 6/2 Remained on BiPap 6/3 Transferred to [facility]CM for respiratory distress 6/4 Intubated, started on vanc/zosyn for aspiration pna 6/5  Received blood transfusions for anemia of chronic disease/critical illness 6/5 and 6/6 Azithromycin added 6/7; vanc discontinued Azithromycin and zosyn discontinued 6/9; diuresis with Lasix gtt continued Self-extubated 6/12 Started on Ceftazidime 6/15 for presumed aspiration pna; Lasix held due to hypernatremia Transferred to [facility] 6/16  Assessment and Plan:  Leonard [name] is a 68 y.o. male presenting with SOB. PMH is significant for COPD, CKD stage 4, CAD s/p CABG, T2DM, HTN, HLD.  Acute hypoxic respiratory failure. Likely a combination of HFpEF exacerbation and aspiration pneumonia.  Has been on vanc/zosyn (6/5-6/9), azithromycin (6/7-6/9), and now ceftazidime (6/15-).  CXR on 6/15 concerning for airspace process due to pulmonary edema, pneumonia, and/or ARDS.  Lasix was stopped on 6/15 due to hypernatremia.  Continues to be on HFNC at 40 L/min flow rate, 50% FiO2.  CXR does show improved aeration on 6/16. - daily CXR - continue ceftazidime 2g BID - f/u procalcitonin - strict I/Os, daily weights - prn albuterol - trickle feeds through NG tube, can advance as tolerated  Hypernatremia.  Sodium has been steadily increasing, [id number] on 6/15, now 152 on 6/16.  Lasix is being held in response to this increase, and he is receiving free water 200 mL Q4H.  It is possible that patient's hypernatremia is due to diabetes insipidus due to his critical illness. - consider obtaining urine sodium, urine osmolality, serum osmolality -  continue to hold Lasix - continue free water - daily BMP  Altered mental status.  Patient has been somnolent and has pulled his NG tube and self-extubated.  CT performed on 6/15 was negative for causes of AMS.  Likely delirium due to critical illness and long hospitalization, although other causes cannot be excluded. - reorient frequently, establish regular sleep cycle  T2DM. A1c 6.9 on 07/05/17. On Lantus 40 units daily and rSSI.  Was hypoglycemic overnight to 66 and received D5W at 50 ml/hr.  Most recent CBG was 90 - monitor CBGs - continue D5W until CBGs normalize - decrease Lantus to 20 units given trickle feeds and hypoglycemia overnight  Hypertension. Has been intermittently hypertensive throughout admission.  BP on 6/16 is 169/66. - Continue home amlodipine 10 mg, Coreg 25 mg BID, clonidine patch 0.2 mg q week, doxazosin 8 mg QHS, hydralazine 100 mg Q8H, Isordil 20 mg TID, minoxidil 5 mg daily  - PRN hydralazine 20 mg Q4H for SBP >160, DBP >100 - PRN labetalol 20 mg Q2H PRN for SBP >130, hold for HR<60  CKD IV. creatinine at baseline on 6/16 at 2.03.  Follows with Dr. Marval Regal. Has been undergoing preparations to start dialysis but both patient and sister report that he has not needed to and continues to make urine. - Monitor kidney fuction  Anemia of chronic disease. Has required multiple transfusions during admission.  Hemoglobin 7.9 on 6/16, transfusion threshold is 7.0.  Hypokalemia.  K of 3.3 on 6/16, magnesium 2.3 - replete with KCl 10 mEq in 100 ml IVPB 10 mEq x 3  Hypothyroidism. TSH wnl at 2.202 on 6/3 - Continue home levothyroxine 163mcg qd  HLD. Last  LDL 37 on 07/06/17; other elements of lipid panel wnl. - continue home atorvastatin 40 mg daily  H/o chronic DVT.  Eliquis discontinued by CCM; currently on prophylactic dose of heparin.  May not be important to treat currently since DVTs were remote.  Gout.  Allopurinol started on 6/13 by CCM.  No recent uric acid  level. - continue allopurinol 100 mg daily - consider obtaining uric acid level  FEN/GI: NG tube in place; no nutrition running currently d/t aspiration risk PPx: Heparin  Disposition: continue in stepdown  Subjective:  Patient denies pain and does not have any questions this morning.  Objective: Temp:  [98.4 F (36.9 C)-100.2 F (37.9 C)] 98.4 F (36.9 C) (06/16 0800) Pulse Rate:  [72-95] 94 (06/16 0800) Resp:  [23-32] 29 (06/16 0800) BP: (128-173)/(39-66) 162/61 (06/16 0800) SpO2:  [86 %-100 %] 97 % (06/16 0800) FiO2 (%):  [40 %] 40 % (06/16 0114) Weight:  [182 lb 12.2 oz (82.9 kg)] 182 lb 12.2 oz (82.9 kg) (06/16 0430) Physical Exam: General: drowsy but arousable, lying in bed with HFNC and NG in place Cardiovascular: RRR, no MRG Respiratory: coarse lung sounds, no rales but scattered rhonchi Abdomen: soft, nontender to palpation Extremities: normal ROM, L AKA  Laboratory: Recent Labs  Lab 07/17/17 0415 07/18/17 0507 07/19/17 0326  WBC 6.0 5.1 5.7  HGB 8.2* 8.0* 7.9*  HCT 26.2* 26.5* 26.0*  PLT 146* 180 163   Recent Labs  Lab 07/17/17 0415 07/18/17 0507 07/19/17 0326  NA 149* 154* 152*  K 3.6 3.1* 3.3*  CL 118* 117* 117*  CO2 22 27 24   BUN 106* 94* 81*  CREATININE 2.18* 2.11* 2.03*  CALCIUM 8.7* 8.7* 8.2*  PROT  --   --  5.5*  BILITOT  --   --  0.7  ALKPHOS  --   --  70  ALT  --   --  11*  AST  --   --  15  GLUCOSE 104* 120* 180*    Imaging/Diagnostic Tests: Dg Chest 1 View  Result Date: 07/08/2017 CLINICAL DATA:  Assess positioning of the orogastric tube. EXAM: CHEST  1 VIEW COMPARISON:  Portable chest x-ray of earlier today FINDINGS: The orogastric tubes proximal port lies at or just below the GE junction with the tip in the gastric cardia. The endotracheal tube lies approximately 4 cm above the carina. There is confluent airspace opacity in the left lung and to a lesser extent on the right. IMPRESSION: Somewhat high positioning of the orogastric  tube. Advancement by 5-10 cm would assure that the proximal port is positioned below the GE junction. Electronically Signed   By: David  Martinique M.D.   On: 07/08/2017 11:21   Ct Head Wo Contrast  Result Date: 07/18/2017 CLINICAL DATA:  Altered mental status and hypoxia EXAM: CT HEAD WITHOUT CONTRAST TECHNIQUE: Contiguous axial images were obtained from the base of the skull through the vertex without intravenous contrast. COMPARISON:  Head CT dated 02/07/2017. FINDINGS: Brain: Ventricles are stable in size and configuration. Again noted is generalized parenchymal atrophy with commensurate dilatation of the ventricles and sulci. Mild chronic small vessel ischemic changes again noted within the periventricular white matter. There is no mass, hemorrhage, edema or other evidence of acute parenchymal abnormality. No extra-axial hemorrhage. Vascular: There are extensive calcified atherosclerotic changes of the large vessels at the skull base, advanced for age. No unexpected hyperdense vessel. Skull: Normal. Negative for fracture or focal lesion. Sinuses/Orbits: No acute finding. Other: None. IMPRESSION:  1. No acute findings.  No intracranial mass, hemorrhage or edema. 2. Mild chronic small vessel ischemic change within the white matter. 3. Advanced atherosclerotic changes of the large vessels at the skull base. Electronically Signed   By: Franki Cabot M.D.   On: 07/18/2017 19:19   Ct Chest Wo Contrast  Result Date: 07/11/2017 CLINICAL DATA:  Respiratory decline, acute respiratory illness, intubated EXAM: CT CHEST WITHOUT CONTRAST TECHNIQUE: Multidetector CT imaging of the chest was performed following the standard protocol without IV contrast. COMPARISON:  07/11/2017 FINDINGS: Cardiovascular: Mild cardiomegaly. No pericardial effusion. Postop changes of coronary bypass. Extensive atherosclerosis of the major branch vessels, thoracic aorta and native coronary vasculature. Exam is limited without contrast.  Mediastinum/Nodes: Feeding tube within the esophagus extending into the stomach. Tube is coiled in the stomach. Prominent scattered mediastinal and suspect hilar lymph nodes likely reactive. Limited assessment without contrast. Lungs/Pleura: Respiratory motion artifact noted. Vascular congestion and central interstitial edema pattern noted. Dense consolidation/collapse of both lower lobes but also airspace disease and consolidation in the dependent upper lobes, lingula and right middle lobe inferiorly. Pneumonia and/or aspiration could have this appearance. Pleural effusions noted bilaterally, larger on the right. Upper Abdomen: Limited without contrast. Splenic granulomata noted. Feeding tube coiled in the stomach. Musculoskeletal: No acute osseous finding. IMPRESSION: Extensive bilateral airspace opacities dependently in the upper lobes but also in the lingula and right middle lobe. Dense bibasilar collapse/consolidation. Pneumonia and/or aspiration is favored with a component of mild superimposed interstitial edema centrally. Associated pleural effusions, larger on the right Mild cardiomegaly without pericardial effusion Limited without IV contrast Aortic Atherosclerosis (ICD10-I70.0). Electronically Signed   By: Jerilynn Mages.  Shick M.D.   On: 07/11/2017 11:16   Dg Chest Port 1 View  Result Date: 07/19/2017 CLINICAL DATA:  Pneumonia.  Diabetes.  Hypertension. EXAM: PORTABLE CHEST 1 VIEW COMPARISON:  One day prior FINDINGS: Feeding tube extends beyond the inferior aspect of the film. Prior median sternotomy. Midline trachea. Cardiomegaly accentuated by AP portable technique. Small right pleural effusion. No pneumothorax. Minimal improvement in perihilar and basilar predominant interstitial and airspace disease. No new pulmonary opacities. IMPRESSION: Slightly improved aeration which could represent decreased pulmonary edema and/or infection. Small right pleural effusion persists. Electronically Signed   By: Abigail Miyamoto  M.D.   On: 07/19/2017 07:34   Dg Chest Port 1 View  Result Date: 07/18/2017 CLINICAL DATA:  Acute respiratory failure with hypoxia. EXAM: PORTABLE CHEST 1 VIEW COMPARISON:  Radiograph 07/16/2017 FINDINGS: Enteric tube in place tip below the diaphragm not included in the field of view. Post median sternotomy with cardiomegaly. Worsening hazy opacity at the lung bases likely combination of pleural fluid and atelectasis. Diffuse perihilar and upper lobe opacities with equivocal progression from prior exam. No pneumothorax. IMPRESSION: Worsening basilar aeration with increasing pleural effusions and bibasilar opacities/atelectasis. Stable cardiomegaly. Equivocal worsening of perihilar and upper lung zone airspace process that may represent pulmonary edema, pneumonia, ARDS or combination thereof. Electronically Signed   By: Jeb Levering M.D.   On: 07/18/2017 03:04   Dg Chest Port 1 View  Result Date: 07/16/2017 CLINICAL DATA:  Shortness of breath. EXAM: PORTABLE CHEST 1 VIEW COMPARISON:  07/13/2017.  07/11/2017.  CT 07/11/2017. FINDINGS: Interim extubation. Feeding tube in stable position. Prior CABG. Cardiomegaly. Progressive diffuse bilateral pulmonary infiltrates most consistent pulmonary edema. Bibasilar atelectasis. Small bilateral pleural effusions. No pneumothorax. IMPRESSION: 1. Interim extubation. Feeding tube noted with tip below left hemidiaphragm. 2. Prior CABG. Cardiomegaly. Progressive diffuse bilateral pulmonary infiltrates most consistent  pulmonary edema. Bibasilar atelectasis. Small bilateral pleural effusions. Electronically Signed   By: Marcello Moores  Register   On: 07/16/2017 07:39   Dg Chest Port 1 View  Result Date: 07/13/2017 CLINICAL DATA:  CHF EXAM: PORTABLE CHEST 1 VIEW COMPARISON:  07/11/2017 FINDINGS: Support devices are stable. Cardiomegaly with vascular congestion and interstitial prominence, likely interstitial edema. No visible effusions or acute bony abnormality. IMPRESSION:  Continued interstitial edema/CHF, slightly improved since prior study. Electronically Signed   By: Rolm Baptise M.D.   On: 07/13/2017 08:51   Dg Chest Port 1 View  Result Date: 07/11/2017 CLINICAL DATA:  Pneumonia EXAM: PORTABLE CHEST 1 VIEW COMPARISON:  Yesterday FINDINGS: Endotracheal tube tip 2 cm above the carina. A feeding tube reaches the stomach at least. Bilateral interstitial and airspace opacity with interval increase. No definite pleural fluid. No pneumothorax. Stable postoperative heart size. There are changes of CABG. IMPRESSION: 1. Worsening bilateral lung opacity with symmetry favoring edema over infection. 2. Lower endotracheal tube, tip 2 cm above the carina. Electronically Signed   By: Monte Fantasia M.D.   On: 07/11/2017 08:07   Dg Chest Port 1 View  Result Date: 07/10/2017 CLINICAL DATA:  Respiratory failure EXAM: PORTABLE CHEST 1 VIEW COMPARISON:  07/09/2017 FINDINGS: Endotracheal tube and NG tube are unchanged. Bilateral airspace opacities, most confluent in the left mid and lower lung, stable since prior study. Heart is upper limits normal in size. No visible effusions. IMPRESSION: Bilateral asymmetric airspace disease, left greater than right could reflect asymmetric edema or infection. No real change. Electronically Signed   By: Rolm Baptise M.D.   On: 07/10/2017 09:02   Dg Chest Port 1 View  Result Date: 07/09/2017 CLINICAL DATA:  67 year old male with history of shortness of breath. EXAM: PORTABLE CHEST 1 VIEW COMPARISON:  Chest x-ray 07/09/2017. FINDINGS: An endotracheal tube is in place with tip 3.2 cm above the carina. A nasogastric tube is seen extending into the stomach, however, the tip of the nasogastric tube extends below the lower margin of the image. There is cephalization of the pulmonary vasculature, indistinctness of the interstitial markings, and patchy airspace disease throughout the lungs bilaterally suggestive of moderate pulmonary edema. Trace left pleural  effusion. Heart size is mildly enlarged. Upper mediastinal contours are distorted by patient's rotation to the right. Aortic atherosclerosis. Status post median sternotomy for CABG. IMPRESSION: 1. The appearance of the chest suggests worsening congestive heart failure, as above. 2. Aortic atherosclerosis. Electronically Signed   By: Vinnie Langton M.D.   On: 07/09/2017 12:44   Dg Chest Port 1 View  Result Date: 07/09/2017 CLINICAL DATA:  Check gastric catheter placement EXAM: PORTABLE CHEST 1 VIEW COMPARISON:  Film from earlier in the same day. FINDINGS: Cardiac shadow is stable. Postsurgical changes are again seen. The patchy infiltrates identified in both lungs are again noted and stable. Nasogastric catheter is again seen. There is again a loop identified in the hypopharynx. The previously seen kink is been reduced although some redundant catheter is present. Endotracheal tube is again noted. IMPRESSION: Redundancy of the gastric catheter within the hypopharynx. The previously seen kink has been reduced although continued withdrawal is recommended. Electronically Signed   By: [name] M.D.   On: 07/09/2017 10:14   Dg Chest Port 1 View  Result Date: 07/09/2017 CLINICAL DATA:  Respiratory failure EXAM: PORTABLE CHEST 1 VIEW COMPARISON:  07/08/2017 FINDINGS: Cardiac shadow is mildly enlarged but stable. Postsurgical changes are again seen. Endotracheal tube and nasogastric catheter are noted. The  nasogastric catheter is looped within the hypopharynx with some kinking new from the prior exam. The proximal side port lies at the gastroesophageal junction. Vascular congestion and mild interstitial edema is noted although the overall appearance is improved when compared with the prior study. No sizable effusion is noted. IMPRESSION: Persistent but improving opacities bilaterally. Tubes and lines as described. A nasogastric catheter appears kinked in the hypopharynx due to redundancy. Electronically Signed    By: [name] M.D.   On: 07/09/2017 09:04   Dg Chest Port 1 View  Result Date: 07/08/2017 CLINICAL DATA:  Acute respiratory failure. EXAM: PORTABLE CHEST 1 VIEW COMPARISON:  07/07/2017 FINDINGS: Heart size is stable.  Prior CABG again noted. Low lung volumes are again demonstrated. Diffuse bilateral pulmonary airspace disease shows no significant change. IMPRESSION: Diffuse bilateral pulmonary airspace disease, without significant change. Electronically Signed   By: Earle Gell M.D.   On: 07/08/2017 09:33   Dg Chest Port 1 View  Result Date: 07/07/2017 CLINICAL DATA:  Increasing shortness of breath, respiratory distress. EXAM: PORTABLE CHEST 1 VIEW COMPARISON:  Chest radiograph July 05, 2017 FINDINGS: Cardiac silhouette is mildly enlarged. Calcified aortic knob. Status post median sternotomy. Worsening interstitial and alveolar airspace opacities with small to moderate right pleural effusion, left costophrenic angle out of field of view. No pneumothorax. Subclavian vascular calcifications. Osseous structures are unchanged. IMPRESSION: Stable cardiomegaly. Worsening interstitial and alveolar airspace opacities concerning for pulmonary edema and/or ARDS. Small to moderate right pleural effusion. Aortic Atherosclerosis (ICD10-I70.0). Electronically Signed   By: Elon Alas M.D.   On: 07/07/2017 03:51   Dg Chest Port 1 View  Result Date: 07/05/2017 CLINICAL DATA:  Shortness of breath EXAM: PORTABLE CHEST 1 VIEW COMPARISON:  03/21/2016; 01/21/2016; 02/05/2015 FINDINGS: Grossly unchanged enlarged cardiac silhouette and mediastinal contours post median sternotomy and CABG. The pulmonary vasculature appears indistinct with cephalization of flow. Small to moderate-sized right-sided pleural effusion with associated right basilar consolidative opacities. Slight worsening of left mid and lower lung heterogeneous potential airspace opacities. No definite left-sided pleural effusion. No pneumothorax. No definite  acute osseus abnormalities. Vascular calcifications overlie right axilla and lower neck bilaterally. IMPRESSION: Constellation of findings most worrisome for pulmonary edema with small to moderate-sized right-sided effusion and associated worsening bilateral mid and lower lung heterogeneous/consolidative opacities, atelectasis versus infiltrate. Electronically Signed   By: Sandi Mariscal M.D.   On: 07/05/2017 10:40   Dg Abd Portable 1v  Result Date: 07/10/2017 CLINICAL DATA:  Feeding tube insertion. EXAM: PORTABLE ABDOMEN - 1 VIEW COMPARISON:  07/08/2017 FINDINGS: A feeding tube has been inserted. The tip is in the region of proximal duodenum. Bowel gas pattern is normal. IMPRESSION: Feeding tube tip in the proximal duodenum. Electronically Signed   By: Lorriane Shire M.D.   On: 07/10/2017 13:08   Dg Abd Portable 1v  Result Date: 07/08/2017 CLINICAL DATA:  OG tube placement. EXAM: PORTABLE ABDOMEN - 1 VIEW COMPARISON:  None. FINDINGS: The tip of the enteric tube is below the diaphragm in the stomach, side-port just beyond the gastroesophageal junction. Air-filled bowel in the central abdomen without obstruction. IMPRESSION: Tip of the enteric tube below the diaphragm, side-port just beyond the gastroesophageal junction. Recommend advancement of at least 3 cm for optimal placement. Electronically Signed   By: Jeb Levering M.D.   On: 07/08/2017 22:24   Dg Swallowing Func-speech Pathology  Result Date: 07/17/2017 Objective Swallowing Evaluation: Type of Study: MBS-Modified Barium Swallow Study  Patient Details Name: Leonard [name] MRN: [id number] Date  of Birth: 1950-03-21 Today's Date: 07/17/2017 Time: SLP Start Time (ACUTE ONLY): 1340 -SLP Stop Time (ACUTE ONLY): 1400 SLP Time Calculation (min) (ACUTE ONLY): 20 min Past Medical History: Past Medical History: Diagnosis Date . Cancer ([place])   skin cancer- . CHF (congestive heart failure) ([place])  . Chronic kidney disease   not on dialysis . Coronary artery  disease  . Gangrene of toe ([place])   right 5th/notes 01/04/2013  . High cholesterol  . Hypertension  . Iron deficiency anemia 08/03/2015 . 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 PAD (peripheral artery disease) ([place])  . Shortness of breath dyspnea   with exertion . Type II diabetes mellitus ([place])   Type II Past Surgical History: Past Surgical History: Procedure Laterality Date . ABDOMINAL AORTOGRAM W/LOWER EXTREMITY N/A 03/12/2017  Procedure: ABDOMINAL AORTOGRAM W/LOWER EXTREMITY;  Surgeon: Conrad Madisonville, MD;  Location: [facility];  Service: Cardiovascular;  Laterality: N/A; . AMPUTATION Right 01/28/2013  Procedure: RAY AMPUTATION RIGHT  4th & 5th TOE;  Surgeon: Rosetta Posner, MD;  Location: [facility];  Service: Vascular;  Laterality: Right; . AMPUTATION Left 03/15/2017  Procedure: AMPUTATION ABOVE KNEE;  Surgeon: Waynetta Sandy, MD;  Location: [facility];  Service: Vascular;  Laterality: Left; . ANGIOPLASTY / STENTING FEMORAL Right 01/13/2013  superficial femoral artery x 2 (3 mm x 60 mm, 4 mm x 60 mm) . AV FISTULA PLACEMENT Left 02/11/2016  Procedure: LEFT UPPER ARM BRACHIAL CEPHALIC ARTERIOVENOUS (AV) FISTULA CREATION;  Surgeon: Conrad Hawthorne, MD;  Location: [facility];  Service: Vascular;  Laterality: Left; . COLONOSCOPY W/ POLYPECTOMY   . CORONARY ARTERY BYPASS GRAFT N/A 04/07/2013  Procedure: CORONARY ARTERY BYPASS GRAFTING (CABG);  Surgeon: Ivin Poot, MD;  Location: [facility];  Service: Open Heart Surgery;  Laterality: N/A; . INTRAOPERATIVE TRANSESOPHAGEAL ECHOCARDIOGRAM N/A 04/07/2013  Procedure: INTRAOPERATIVE TRANSESOPHAGEAL ECHOCARDIOGRAM;  Surgeon: Ivin Poot, MD;  Location: [facility];  Service: Open Heart Surgery;  Laterality: N/A; . LEFT HEART CATHETERIZATION WITH CORONARY ANGIOGRAM N/A 04/05/2013  Procedure: LEFT HEART CATHETERIZATION WITH CORONARY ANGIOGRAM;  Surgeon: Peter M Martinique, MD;  Location: [facility];  Service: Cardiovascular;  Laterality: N/A; . LOWER EXTREMITY ANGIOGRAM  Bilateral 01/06/2013  Procedure: LOWER EXTREMITY ANGIOGRAM;  Surgeon: Conrad Birch Run, MD;  Location: [facility];  Service: Cardiovascular;  Laterality: Bilateral; . LOWER EXTREMITY ANGIOGRAM Right 01/13/2013  Procedure: LOWER EXTREMITY ANGIOGRAM;  Surgeon: Conrad Buck Run, MD;  Location: [facility] CATH LAB;  Service: Cardiovascular;  Laterality: Right; . SKIN CANCER EXCISION  2017 . THROAT SURGERY  1983  polyps . TONSILLECTOMY AND ADENOIDECTOMY  ~ 1141 HPI: 67 year old man with a history of diabetes, peripheral vascular disease, hypertension and chronic kidney disease.  He was admitted with orthopnea that progressed quickly to fulminant respiratory failure.  He required mechanical ventilation 6/5 and for a period of time was on high [name], neuromuscular blockade.  He has been aggressively diuresed and blood pressure tightly controlled.  He self extubated and 6/12 and has tolerated. Pulled Cortrak.  No data recorded Assessment / Plan / Recommendation CHL IP CLINICAL IMPRESSIONS 07/17/2017 Clinical Impression Pt is not safe for po's at this time following MBS given laryngeal penetration with honey thick and thin barium. Laryngeal elevation and epiglottic deflection decreased in addition to mildly delayed swallow initiation leading to mild-moderate vallecular residue that penetrated to vocal cords silently after the swallow. Thin barium also penetrated to the vocal cords without sensation and cued coughs/throat clears given throught the study  were ineffective to clear vestibule. Reduced oral cohesion with delayed transit. Pt with decreased endurance, excessive O2 requirement and needs to obtain nutrition via Cortrak at present. ST will continue to work with pt to improve oral manipulation, strengthen cough to facilitate vocal cord adduction and clearance.   SLP Visit Diagnosis Dysphagia, oropharyngeal phase (R13.12) Attention and concentration deficit following -- Frontal lobe and executive function deficit following -- Impact on  safety and function Moderate aspiration risk;Severe aspiration risk   CHL IP TREATMENT RECOMMENDATION 07/17/2017 Treatment Recommendations Therapy as outlined in treatment plan below   Prognosis 07/17/2017 Prognosis for Safe Diet Advancement Good Barriers to Reach Goals -- Barriers/Prognosis Comment -- CHL IP DIET RECOMMENDATION 07/17/2017 SLP Diet Recommendations NPO Liquid Administration via -- Medication Administration Via alternative means Compensations -- Postural Changes --   CHL IP OTHER RECOMMENDATIONS 07/17/2017 Recommended Consults -- Oral Care Recommendations Oral care QID Other Recommendations --   CHL IP FOLLOW UP RECOMMENDATIONS 07/17/2017 Follow up Recommendations Skilled Nursing facility   [facility] IP FREQUENCY AND DURATION 07/17/2017 Speech Therapy Frequency (ACUTE ONLY) min 2x/week Treatment Duration 2 weeks      CHL IP ORAL PHASE 07/17/2017 Oral Phase Impaired Oral - Pudding Teaspoon -- Oral - Pudding Cup -- Oral - Honey Teaspoon Delayed oral transit;Weak lingual manipulation Oral - Honey Cup Weak lingual manipulation;Delayed oral transit Oral - Nectar Teaspoon -- Oral - Nectar Cup -- Oral - Nectar Straw -- Oral - Thin Teaspoon -- Oral - Thin Cup Decreased bolus cohesion Oral - Thin Straw -- Oral - Puree Weak lingual manipulation Oral - Mech Soft -- Oral - Regular -- Oral - Multi-Consistency -- Oral - Pill -- Oral Phase - Comment --  CHL IP PHARYNGEAL PHASE 07/17/2017 Pharyngeal Phase Impaired Pharyngeal- Pudding Teaspoon -- Pharyngeal -- Pharyngeal- Pudding Cup -- Pharyngeal -- Pharyngeal- Honey Teaspoon Pharyngeal residue - valleculae;Pharyngeal residue - pyriform;Reduced laryngeal elevation;Delayed swallow initiation-pyriform sinuses Pharyngeal -- Pharyngeal- Honey Cup Pharyngeal residue - valleculae;Pharyngeal residue - pyriform;Penetration/Apiration after swallow;Reduced laryngeal elevation;Reduced epiglottic inversion Pharyngeal Material enters airway, CONTACTS cords and not ejected out Pharyngeal-  Nectar Teaspoon -- Pharyngeal -- Pharyngeal- Nectar Cup -- Pharyngeal -- Pharyngeal- Nectar Straw -- Pharyngeal -- Pharyngeal- Thin Teaspoon -- Pharyngeal -- Pharyngeal- Thin Cup Pharyngeal residue - valleculae;Pharyngeal residue - pyriform;Penetration/Aspiration during swallow Pharyngeal Material enters airway, CONTACTS cords and not ejected out Pharyngeal- Thin Straw -- Pharyngeal -- Pharyngeal- Puree Pharyngeal residue - valleculae;Pharyngeal residue - pyriform;Reduced laryngeal elevation;Reduced epiglottic inversion Pharyngeal -- Pharyngeal- Mechanical Soft -- Pharyngeal -- Pharyngeal- Regular -- Pharyngeal -- Pharyngeal- Multi-consistency -- Pharyngeal -- Pharyngeal- Pill -- Pharyngeal -- Pharyngeal Comment --  CHL IP CERVICAL ESOPHAGEAL PHASE 07/17/2017 Cervical Esophageal Phase WFL Pudding Teaspoon -- Pudding Cup -- Honey Teaspoon -- Honey Cup -- Nectar Teaspoon -- Nectar Cup -- Nectar Straw -- Thin Teaspoon -- Thin Cup -- Thin Straw -- Puree -- Mechanical Soft -- Regular -- Multi-consistency -- Pill -- Cervical Esophageal Comment -- No flowsheet data found. [name] 07/17/2017, 2:40 PM [name] Caroli.Ed CCC-SLP Pager [phone]              [place] Ekg Site Rite  Result Date: 07/14/2017 If Site Rite image not attached, placement could not be confirmed due to current cardiac rhythm.    Kathrene Alu, MD 07/19/2017, 8:15 AM PGY-1, [place] [facility] pager: [phone], text pages welcome

## 2017-07-19 NOTE — Progress Notes (Signed)
Called emergently to patient's bedside for worsening hypoxia. Had previously been on Hiflo 40% FIO2 40L but now is on 90% FIO2 50L with Pox 94% and RR 32. Lungs CTA b/l. Patient reports no SOB to me but RN reports he was c/o SOB just prior to my arriving. Denies CP. He appears more dyspneic than my last evaluation of him yesterday morning.   ABG shows 7.43 / 36 / 93 / 23 / 97% (performed on 80% FIO2 40L Hiflo PLUS 100% NRB mask)  Will obtain stat CXR and labs. Question if should resume diuresis (had been held due to increasing sodium level which is now still high this AM but improved slightly 154>>152). PCCM NP has called patient's family to ask about goals of care; awaiting a call back from them.   45 minutes critical care time  Vernie Murders, MD Pulmonary & Critical Care Medicine Pager: [phone]

## 2017-07-19 NOTE — Progress Notes (Addendum)
  RT called to assess patient desaturation on HHFNC, 40L 40% FIO2, SPO@ 71%.  Placed on NRB. SP02 @ 93%. RT will continue to monitor. RN at bedside. CPT held due to condition.

## 2017-07-19 NOTE — Progress Notes (Signed)
PCCM Progress Note  Admission date: 07/05/2017 CC: short of breath  HPI: 67 yo male presented with shortness of breath from CHF exacerbation, pleural effusion, aspiration pneumonia.  Tried on Bipap and high flow [place].    PMHx: CKD 4, CAD s/p CABG, DM, HTN, HLD, DVT on eliquis.  Subjective: More alert.  Not as much cough.  Vital signs: BP (!) 150/64 (BP Location: Right Arm)   Pulse 86   Temp 97.8 F (36.6 C) (Axillary)   Resp (!) 30   Ht 5\' 8"  (1.727 m)   Wt 182 lb 12.2 oz (82.9 kg)   SpO2 100%   BMI 27.79 kg/m   Intake/output: I/O last 3 completed shifts: In: 2105.5 [I.V.:477.2; NG/GT:1255; IV Piggyback:373.3] Out: 2450 [Urine:2150; Stool:300]  Physical exam:  General - alert Eyes - pupils reactive ENT - no stridor Cardiac - regular, no murmur Chest - faint crackles b/l Abd - soft, non tender Ext - no edema Skin - no rashes Neuro - follows commands appropriately  Labs: CBC Recent Labs    07/17/17 0415 07/18/17 0507 07/19/17 0326  WBC 6.0 5.1 5.7  HGB 8.2* 8.0* 7.9*  HCT 26.2* 26.5* 26.0*  PLT 146* 180 163    Coag's No results for input(s): APTT, INR in the last 72 hours.  BMET Recent Labs    07/17/17 0415 07/18/17 0507 07/19/17 0326  NA 149* 154* 152*  K 3.6 3.1* 3.3*  CL 118* 117* 117*  CO2 22 27 24   BUN 106* 94* 81*  CREATININE 2.18* 2.11* 2.03*  GLUCOSE 104* 120* 180*    Electrolytes Recent Labs    07/17/17 0415 07/18/17 0507 07/19/17 0326  CALCIUM 8.7* 8.7* 8.2*  MG 2.4 2.5* 2.3  PHOS 3.3  --   --     Sepsis Markers Recent Labs    07/18/17 0828 07/19/17 0326  PROCALCITON 0.21 0.17    ABG Recent Labs    07/18/17 0239  PHART 7.490*  PCO2ART 35.9  PO2ART 50.4*    Liver Enzymes Recent Labs    07/19/17 0326  AST 15  ALT 11*  ALKPHOS 70  BILITOT 0.7  ALBUMIN 2.6*    Cardiac Enzymes Recent Labs    07/18/17 0828  TROPONINI 0.04*    Glucose Recent Labs    07/18/17 2016 07/18/17 2229 07/19/17 0003  07/19/17 0420 07/19/17 0757 07/19/17 1132  GLUCAP 78 66 79 90 100* 107*    Imaging Ct Head Wo Contrast  Result Date: 07/18/2017 CLINICAL DATA:  Altered mental status and hypoxia EXAM: CT HEAD WITHOUT CONTRAST TECHNIQUE: Contiguous axial images were obtained from the base of the skull through the vertex without intravenous contrast. COMPARISON:  Head CT dated 02/07/2017. FINDINGS: Brain: Ventricles are stable in size and configuration. Again noted is generalized parenchymal atrophy with commensurate dilatation of the ventricles and sulci. Mild chronic small vessel ischemic changes again noted within the periventricular white matter. There is no mass, hemorrhage, edema or other evidence of acute parenchymal abnormality. No extra-axial hemorrhage. Vascular: There are extensive calcified atherosclerotic changes of the large vessels at the skull base, advanced for age. No unexpected hyperdense vessel. Skull: Normal. Negative for fracture or focal lesion. Sinuses/Orbits: No acute finding. Other: None. IMPRESSION: 1. No acute findings.  No intracranial mass, hemorrhage or edema. 2. Mild chronic small vessel ischemic change within the white matter. 3. Advanced atherosclerotic changes of the large vessels at the skull base. Electronically Signed   By: Franki Cabot M.D.   On: 07/18/2017 19:19  Dg Chest Port 1 View  Result Date: 07/19/2017 CLINICAL DATA:  Pneumonia.  Diabetes.  Hypertension. EXAM: PORTABLE CHEST 1 VIEW COMPARISON:  One day prior FINDINGS: Feeding tube extends beyond the inferior aspect of the film. Prior median sternotomy. Midline trachea. Cardiomegaly accentuated by AP portable technique. Small right pleural effusion. No pneumothorax. Minimal improvement in perihilar and basilar predominant interstitial and airspace disease. No new pulmonary opacities. IMPRESSION: Slightly improved aeration which could represent decreased pulmonary edema and/or infection. Small right pleural effusion persists.  Electronically Signed   By: Abigail Miyamoto M.D.   On: 07/19/2017 07:34   Dg Chest Port 1 View  Result Date: 07/18/2017 CLINICAL DATA:  Acute respiratory failure with hypoxia. EXAM: PORTABLE CHEST 1 VIEW COMPARISON:  Radiograph 07/16/2017 FINDINGS: Enteric tube in place tip below the diaphragm not included in the field of view. Post median sternotomy with cardiomegaly. Worsening hazy opacity at the lung bases likely combination of pleural fluid and atelectasis. Diffuse perihilar and upper lobe opacities with equivocal progression from prior exam. No pneumothorax. IMPRESSION: Worsening basilar aeration with increasing pleural effusions and bibasilar opacities/atelectasis. Stable cardiomegaly. Equivocal worsening of perihilar and upper lung zone airspace process that may represent pulmonary edema, pneumonia, ARDS or combination thereof. Electronically Signed   By: Jeb Levering M.D.   On: 07/18/2017 03:04    Studies: PFT 03/17/16 >> FEV1 2.25 (81%), FEV1% 88, TLC 3.70 (55%), DLCO 46% Echo 07/06/17 >> mild LVH, EF 65 to 70%, grade 2 DD, PAS 46 mmHg CT chest 07/11/17 >> interstitial edema, basilar consolidation, b/l effusions CT chest 6/15 >> chronic small vessel ischemic changes, advanced atherosclerosis of large vessels  Cultures: Blood 6/04 >> negative Sputum 6/05 >> negative Blood 6/05 >> negative Respiratory viral panel 6/07 >> negative  Antibiotics: Vancomycin 6/05 >> 6/09 Zosyn 6/05 >> 6/09 Tressie Ellis 6/15 >>   Discussion: 67 yo male with acute hypoxic respiratory failure.  Most of this seems to be related to aspiration pneumonia.  He has been diuresed, and now Na increasing.  Reported to have COPD, but PFT from February 2018 not consistent with this.  Assessment/plan:  Acute hypoxic respiratory failure. - oxygen to keep SpO2 > 92%  HCAP. - day 2 of ABx - f/u CXR intermittently  Dysphagia. - can resume tube feeds - f/u with speech therapy  HFpEF, HLD. - continue to hold lasix -  norvasc, lipitor, coreg, catapres, cardura, hydralazine, minoxidil  Hypernatremia. CKD 4. - f/u BMET  Anemia of critical illness and chronic disease. - f/u CBC  DM type II. Hypothyroidism. - SSI with lantus - synthroid  Altered mental status. - improved 6/16 - monitor mental status  DVT prophylaxis - SQ heparin SUP - not indicated Nutrition - NPO except meds Goals of care - full code   Chesley Mires, MD [place] 07/19/2017, 2:18 PM

## 2017-07-19 NOTE — Progress Notes (Signed)
  Echocardiogram 2D Echocardiogram has been performed.  Johny Chess 07/19/2017, 4:16 PM

## 2017-07-19 NOTE — Plan of Care (Signed)
  Problem: Clinical Measurements: Goal: Respiratory complications will improve Outcome: Progressing Goal: Cardiovascular complication will be avoided Outcome: Progressing   Problem: Health Behavior/Discharge Planning: Goal: Ability to manage health-related needs will improve Outcome: Not Progressing   Problem: Activity: Goal: Risk for activity intolerance will decrease Outcome: Not Progressing   Problem: Nutrition: Goal: Adequate nutrition will be maintained Outcome: Not Progressing

## 2017-07-19 NOTE — Plan of Care (Signed)
  Problem: Education: Goal: Knowledge of General Education information will improve Outcome: Not Progressing   Problem: Health Behavior/Discharge Planning: Goal: Ability to manage health-related needs will improve Outcome: Not Progressing   Problem: Clinical Measurements: Goal: Respiratory complications will improve Outcome: Not Progressing   Problem: Nutrition: Goal: Adequate nutrition will be maintained Outcome: Not Progressing   Problem: Coping: Goal: Level of anxiety will decrease Outcome: Not Progressing

## 2017-07-19 NOTE — Progress Notes (Addendum)
PCCM Interval Note  Extensive GOC Conversation with Family and Patient. Understand that despite aggressive treatment patient has not improved significantly over the last 14 days. Patient states that he would not want to go back on the ventilator. Family also agrees that intubation not be in his best interest. Also agree to no CPR/Defib/ACLS however would like to continue medical management and BIPAP if needed. Currently patient is on 70/50 on High Flow with saturations >92, however with movement oxygenation drops to 60-70%. CXR with increasing vascular congestion and pulmonary edema. Given need for closer monitoring will transfer to ICU.    Leonard [name], AGACNP-BC [place] [facility]  Pgr: [phone]  PCCM Pgr: [phone]

## 2017-07-20 DIAGNOSIS — L8990 Pressure ulcer of unspecified site, unspecified stage: Secondary | ICD-10-CM | POA: Diagnosis present

## 2017-07-20 LAB — TROPONIN I: Troponin I: 0.05 ng/mL (ref <0.03)

## 2017-07-20 LAB — BASIC METABOLIC PANEL
Anion gap: 12 (ref 5–15)
Anion gap: 10 (ref 5–15)
BUN: 72 mg/dL — AB (ref 6–20)
BUN: 68 mg/dL — AB (ref 6–20)
CO2: 22 mmol/L (ref 22–32)
CO2: 25 mmol/L (ref 22–32)
CREATININE: 1.95 mg/dL — AB (ref 0.61–1.24)
Calcium: 8 mg/dL — ABNORMAL LOW (ref 8.9–10.3)
Calcium: 8.3 mg/dL — ABNORMAL LOW (ref 8.9–10.3)
Chloride: 114 mmol/L — ABNORMAL HIGH (ref 101–111)
Chloride: 118 mmol/L — ABNORMAL HIGH (ref 101–111)
Creatinine, Ser: 1.93 mg/dL — ABNORMAL HIGH (ref 0.61–1.24)
GFR calc Af Amer: 39 mL/min — ABNORMAL LOW (ref >60)
GFR, EST AFRICAN AMERICAN: 40 mL/min — AB (ref >60)
GFR, EST NON AFRICAN AMERICAN: 35 mL/min — AB (ref >60)
GFR, EST NON AFRICAN AMERICAN: 34 mL/min — AB (ref >60)
Glucose, Bld: 181 mg/dL — ABNORMAL HIGH (ref 65–99)
Glucose, Bld: 113 mg/dL — ABNORMAL HIGH (ref 65–99)
POTASSIUM: 3.6 mmol/L (ref 3.5–5.1)
Potassium: 3.7 mmol/L (ref 3.5–5.1)
SODIUM: 148 mmol/L — AB (ref 135–145)
SODIUM: 153 mmol/L — AB (ref 135–145)

## 2017-07-20 LAB — ECHOCARDIOGRAM LIMITED
Height: 68 in
Weight: 2924.18 oz

## 2017-07-20 LAB — CBC
HEMATOCRIT: 24.7 % — AB (ref 39.0–52.0)
Hemoglobin: 7.2 g/dL — ABNORMAL LOW (ref 13.0–17.0)
MCH: 25.8 pg — AB (ref 26.0–34.0)
MCHC: 29.1 g/dL — ABNORMAL LOW (ref 30.0–36.0)
MCV: 88.5 fL (ref 78.0–100.0)
Platelets: 160 10*3/uL (ref 150–400)
RBC: 2.79 MIL/uL — AB (ref 4.22–5.81)
RDW: 17.2 % — ABNORMAL HIGH (ref 11.5–15.5)
WBC: 4.5 10*3/uL (ref 4.0–10.5)

## 2017-07-20 LAB — GLUCOSE, CAPILLARY
GLUCOSE-CAPILLARY: 113 mg/dL — AB (ref 65–99)
GLUCOSE-CAPILLARY: 125 mg/dL — AB (ref 65–99)
GLUCOSE-CAPILLARY: 116 mg/dL — AB (ref 65–99)
GLUCOSE-CAPILLARY: 138 mg/dL — AB (ref 65–99)
Glucose-Capillary: 111 mg/dL — ABNORMAL HIGH (ref 65–99)
Glucose-Capillary: 117 mg/dL — ABNORMAL HIGH (ref 65–99)
Glucose-Capillary: 164 mg/dL — ABNORMAL HIGH (ref 65–99)

## 2017-07-20 LAB — BRAIN NATRIURETIC PEPTIDE: B NATRIURETIC PEPTIDE 5: 357.5 pg/mL — AB (ref 0.0–100.0)

## 2017-07-20 LAB — MAGNESIUM
MAGNESIUM: 2.4 mg/dL (ref 1.7–2.4)
Magnesium: 2.5 mg/dL — ABNORMAL HIGH (ref 1.7–2.4)

## 2017-07-20 LAB — PHOSPHORUS
PHOSPHORUS: 3.7 mg/dL (ref 2.5–4.6)
Phosphorus: 3.7 mg/dL (ref 2.5–4.6)

## 2017-07-20 LAB — PROCALCITONIN: Procalcitonin: 0.19 ng/mL

## 2017-07-20 MED ORDER — FUROSEMIDE 10 MG/ML IJ SOLN
10.0000 mg/h | INTRAVENOUS | Status: DC
  Administered 2017-07-20 – 2017-07-23 (×4): 10 mg/h via INTRAVENOUS
  Filled 2017-07-20 (×9): qty 25

## 2017-07-20 MED ORDER — ORAL CARE MOUTH RINSE
15.0000 mL | Freq: Two times a day (BID) | OROMUCOSAL | Status: DC
  Administered 2017-07-20 – 2017-08-07 (×31): 15 mL via OROMUCOSAL

## 2017-07-20 NOTE — Progress Notes (Signed)
PCCM Progress Note  Admission date: 07/05/2017 CC: short of breath  HPI: 67 yo male presented with shortness of breath from CHF exacerbation, pleural effusion, aspiration pneumonia.  Tried on Bipap and high flow [place].    PMHx: CKD 4, CAD s/p CABG, DM, HTN, HLD, DVT on eliquis.  Subjective: tx ICU overnight for worsening respiratory failure/ hypoxia.  Now on HFNC.  Goals of care discussion from overnight noted.  DNI, DNR, bipap only.   Vital signs: BP (!) 152/61   Pulse 84   Temp 99.3 F (37.4 C) (Oral)   Resp (!) 29   Ht 5\' 8"  (1.727 m)   Wt 84.9 kg (187 lb 2.7 oz)   SpO2 95%   BMI 28.46 kg/m   Intake/output: I/O last 3 completed shifts: In: 3052.5 [I.V.:1666.8; NG/GT:859; IV Piggyback:526.7] Out: 0973 [Urine:2410; Stool:50]  Physical exam:  General - chronically ill appearing male, NAD  HEENT - mm moist, no JVD  Cardiac -s1s2 rrr Chest - resps even non labored on HFNC Abd - soft, non tender Ext - scant BLE edema  Skin - no rashes Neuro - awake, alert, interactive, confused at times.   Labs: CBC Recent Labs    07/18/17 0507 07/19/17 0326 07/20/17 0458  WBC 5.1 5.7 4.5  HGB 8.0* 7.9* 7.2*  HCT 26.5* 26.0* 24.7*  PLT 180 163 160    Coag's No results for input(s): APTT, INR in the last 72 hours.  BMET Recent Labs    07/19/17 0326 07/19/17 2229 07/20/17 0458  NA 152* 148* 153*  K 3.3* 3.7 3.6  CL 117* 114* 118*  CO2 24 22 25   BUN 81* 72* 68*  CREATININE 2.03* 1.93* 1.95*  GLUCOSE 180* 181* 113*    Electrolytes Recent Labs    07/19/17 0326 07/19/17 2229 07/20/17 0458  CALCIUM 8.2* 8.0* 8.3*  MG 2.3 2.4 2.5*  PHOS  --  3.7 3.7    Sepsis Markers Recent Labs    07/18/17 0828 07/19/17 0326 07/20/17 0458  PROCALCITON 0.21 0.17 0.19    ABG Recent Labs    07/18/17 0239 07/19/17 2143  PHART 7.490* 7.432  PCO2ART 35.9 36.4  PO2ART 50.4* 93.2    Liver Enzymes Recent Labs    07/19/17 0326  AST 15  ALT 11*  ALKPHOS 70  BILITOT  0.7  ALBUMIN 2.6*    Cardiac Enzymes Recent Labs    07/18/17 0828 07/19/17 2229  TROPONINI 0.04* 0.05*    Glucose Recent Labs    07/19/17 1132 07/19/17 1613 07/19/17 2009 07/20/17 0003 07/20/17 0357 07/20/17 0735  GLUCAP 107* 116* 142* 164* 117* 113*    Imaging Ct Head Wo Contrast  Result Date: 07/18/2017 CLINICAL DATA:  Altered mental status and hypoxia EXAM: CT HEAD WITHOUT CONTRAST TECHNIQUE: Contiguous axial images were obtained from the base of the skull through the vertex without intravenous contrast. COMPARISON:  Head CT dated 02/07/2017. FINDINGS: Brain: Ventricles are stable in size and configuration. Again noted is generalized parenchymal atrophy with commensurate dilatation of the ventricles and sulci. Mild chronic small vessel ischemic changes again noted within the periventricular white matter. There is no mass, hemorrhage, edema or other evidence of acute parenchymal abnormality. No extra-axial hemorrhage. Vascular: There are extensive calcified atherosclerotic changes of the large vessels at the skull base, advanced for age. No unexpected hyperdense vessel. Skull: Normal. Negative for fracture or focal lesion. Sinuses/Orbits: No acute finding. Other: None. IMPRESSION: 1. No acute findings.  No intracranial mass, hemorrhage or edema. 2. Mild  chronic small vessel ischemic change within the white matter. 3. Advanced atherosclerotic changes of the large vessels at the skull base. Electronically Signed   By: Franki Cabot M.D.   On: 07/18/2017 19:19   Dg Chest Port 1 View  Result Date: 07/19/2017 CLINICAL DATA:  Acute respiratory failure EXAM: PORTABLE CHEST 1 VIEW COMPARISON:  07/19/2017, 07/18/2017, 07/16/2017 FINDINGS: Post sternotomy changes. Esophageal tube tip is below the diaphragm but non included. Cardiomegaly with worsening vascular congestion and pulmonary edema. Small bilateral pleural effusions, right greater than left. No pneumothorax. IMPRESSION: Cardiomegaly  with increasing vascular congestion and pulmonary edema. Small bilateral right greater than left pleural effusions. Electronically Signed   By: Donavan Foil M.D.   On: 07/19/2017 23:00   Dg Chest Port 1 View  Result Date: 07/19/2017 CLINICAL DATA:  Pneumonia.  Diabetes.  Hypertension. EXAM: PORTABLE CHEST 1 VIEW COMPARISON:  One day prior FINDINGS: Feeding tube extends beyond the inferior aspect of the film. Prior median sternotomy. Midline trachea. Cardiomegaly accentuated by AP portable technique. Small right pleural effusion. No pneumothorax. Minimal improvement in perihilar and basilar predominant interstitial and airspace disease. No new pulmonary opacities. IMPRESSION: Slightly improved aeration which could represent decreased pulmonary edema and/or infection. Small right pleural effusion persists. Electronically Signed   By: Abigail Miyamoto M.D.   On: 07/19/2017 07:34    Studies: PFT 03/17/16 >> FEV1 2.25 (81%), FEV1% 88, TLC 3.70 (55%), DLCO 46% Echo 07/06/17 >> mild LVH, EF 65 to 70%, grade 2 DD, PAS 46 mmHg CT chest 07/11/17 >> interstitial edema, basilar consolidation, b/l effusions CT chest 6/15 >> chronic small vessel ischemic changes, advanced atherosclerosis of large vessels  Cultures: Blood 6/04 >> negative Sputum 6/05 >> negative Blood 6/05 >> negative Respiratory viral panel 6/07 >> negative  Antibiotics: Vancomycin 6/05 >> 6/09 Zosyn 6/05 >> 6/09 Tressie Ellis 6/15 >>   Discussion: 67 yo male with acute hypoxic respiratory failure.  Most of this seems to be related to aspiration pneumonia.  He has been diuresed, and now Na increasing.  Reported to have COPD, but PFT from February 2018 not consistent with this.  Assessment/plan:  Acute hypoxic respiratory failure. PLAN -  Continue HFNC and titrate as able to keep sats 88-92% DNI  bipap PRN  abx as below    HCAP. PLAN -  day 3 of ABx f/u CXR intermittently  Dysphagia. Some ??aspiration of TF overnight PLAN -  Ok for  free water/meds per tube  Hold off on resuming TF for now  Speech f/u   HFpEF, HLD. PLAN -  Continue lasix gtt  norvasc, lipitor, coreg, catapres, cardura, hydralazine, minoxidil  Hypernatremia. CKD 4. PLAN -  F/u chem  Lasix as above   Anemia of critical illness and chronic disease. PLAN -  SQ heparin  F/u CBC   DM type II. Hypothyroidism. PLAN -  Continue lantus, SSI  Synthroid   Altered mental status. - improving slowly.  PLAN -  Supportive care    DVT prophylaxis - SQ heparin SUP - not indicated Nutrition - NPO except meds Goals of care - full code  Sister updated at bedside 6/17.  Overnight goals of care discussion reviewed. Continue current rx, wean O2 as able.  May need to discuss further, consider transition to comfort if he worsens or does not show significant improvement over next 24-48 hrs.    Nickolas Madrid, NP 07/20/2017  10:22 AM Pager: [phone] or [phone]

## 2017-07-20 NOTE — Progress Notes (Signed)
Nutrition Follow-up  DOCUMENTATION CODES:   Not applicable  INTERVENTION:   Pending improvement in medical status and ability to resume TF, recommend:  Vital AF 1.2 at 75 ml/h (1800 ml per day) to provide 2160 kcal, 135 gm protein, 1460 ml free water daily  NUTRITION DIAGNOSIS:   Inadequate oral intake related to inability to eat as evidenced by NPO status.  Ongoing  GOAL:   Patient will meet greater than or equal to 90% of their needs  Unmet  MONITOR:   Skin, I & O's, Labs   ASSESSMENT:   67 y.o. M admitted on 07/05/17 for acute respiratory failure with hypoxia and CHF exacerbation. PMH of HTN, PAD, CHF, CAD, hx of skin cancer, T2DM, hx of gangrene of toe 2014, CKD stg IV, and anemia. 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 Pt intubate 6/5.   Patient was transferred back to the ICU last night for worsening respiratory failure and hypoxia. Currently on HFNC. Goals of care discussion occurred overnight. Patient does not want to be re-intubated. Changed to DNI, DNR, bipap only. May be transitioned to comfort care.  Received consult for TF management, however, TF is currently on hold due to suspected aspiration of TF overnight. Cortrak was replaced on 6/14, tip is post-pyloric. Receiving free water and medications per tube.  Labs and medications reviewed.  SLP following.  Diet Order:   Diet Order           Diet NPO time specified  Diet effective now          EDUCATION NEEDS:   Not appropriate for education at this time  Skin:  Skin Assessment: Skin Integrity Issues: Skin Integrity Issues:: Other (Comment), Stage II Stage II: coccyx Other: R foot wound  Last BM:  6/17 (rectal tube)  Height:   Ht Readings from Last 1 Encounters:  07/05/17 5\' 8"  (1.727 m)    Weight:   Wt Readings from Last 1 Encounters:  07/20/17 187 lb 2.7 oz (84.9 kg)    Ideal Body Weight:  64.4 kg  BMI:  Body mass index is 28.46 kg/m.  Estimated Nutritional Needs:   Kcal:  2100-2300  kcal  Protein:  120-135 g  Fluid:  >/= 2.1 L or per MD    Molli Barrows, RD, LDN, [place] Pager [phone] After Hours Pager [phone]

## 2017-07-20 NOTE — Progress Notes (Signed)
  Speech Language Pathology Treatment: Dysphagia  Patient Details Name: Leonard [name] MRN: [id number] DOB: November 03, 1950 Today's Date: 07/20/2017 Time: 5093-2671 SLP Time Calculation (min) (ACUTE ONLY): 8 min  Assessment / Plan / Recommendation Clinical Impression  Patient seen for dysphagia treatment with focus on facilitation of improved swallowing musculature and po readiness. Noted events of early this am with decompensation of O2. Patient now with increased O2 requirements, declining intubation. Per SLP judgement given increased WOB and acute decompensation, no po trials provided today. SLP completed oral care as patient arouses easily. With max SLP instruction (verbal and visual), patient able to perform effortful dry swallows to facilitate use of swallowing musculature x 5. Note that patient is lethargic, falling asleep easily in between attempts. Education complete with patient's sister in room regarding current status and SLP plan which includes facilitation of restoration of functional swallowing abilities as patient stabilizes from a respiratory standpoint. SLP will continue to follow up.     HPI HPI: 67 year old man with a history of diabetes, peripheral vascular disease, hypertension and chronic kidney disease.  He was admitted with orthopnea that progressed quickly to fulminant respiratory failure.  He required mechanical ventilation 6/5 and for a period of time was on high [name], neuromuscular blockade.  He has been aggressively diuresed and blood pressure tightly controlled.  He self extubated and 6/12 and has tolerated. Pulled Cortrak.       SLP Plan  Continue with current plan of care      Recommendations  Diet recommendations: NPO Medication Administration: Via alternative means               Oral Care Recommendations: Oral care QID Follow up Recommendations: Skilled Nursing facility SLP Visit Diagnosis: Dysphagia, oropharyngeal phase (R13.12) Plan: Continue with  current plan of care       [facility] [place], [place] [phone]                 Malique Driskill Meryl 07/20/2017, 11:27 AM

## 2017-07-20 NOTE — Progress Notes (Signed)
FPTS Social Note  Leonard [name]'s mental status was improved this morning, but he remains on 30 L/min oxygen flow rate with 60% FiO2.  Family Medicine will continue to follow and resume care when transferred from ICU.  Leonard Gosa [name], MD PGY-1, [facility] 07/20/2017 9:28 AM

## 2017-07-20 NOTE — Progress Notes (Signed)
I was paged by RN after patient O2 sat dropped into the 80's requiring increase Fio2 from 40 to 80% while on 40L high flow. Patient was not able to maintain sats and NRB mask was added with minimal improvement. Patient was also using accesory muscles to breath. PCCM was consulted for acute decompensation in respiratory status. Team was sent for evaluation. Patient continue to be hypoxic requiring higher level of care and was transferred to the MICU for further management. Goals of care discussed with family members as patient decline reintubation and being placed on ventilator. FMTS will continue to follow while in the ICU and will assume care upon discharge from the unit.  Marjie Skiff, MD [place], PGY-2

## 2017-07-20 NOTE — Significant Event (Signed)
Rapid Response Event Note  Patient needed to be transferred back to ICU. Patient transferred to 2M05.  Of note, I was not made aware of the change in patient's condition, I was on the floor rounding and made aware.

## 2017-07-20 NOTE — Progress Notes (Addendum)
Review of patient chart shows he has required SSI a few times today. No episodes of hypoglycemia today. Will decrease D5W gtt from 50cc/hr down to 20cc/hr. Discussed with bedside RN.   Hypernatremia mildly worsened this AM but missed 2 doses of his free water flushes overnight; continue free water flushes at 200cc q4hrs

## 2017-07-20 NOTE — Progress Notes (Signed)
Patient transported to 2M05 via NRB.  Placed on [place] 50% FIO2. RN, CCM at bedside.

## 2017-07-21 ENCOUNTER — Inpatient Hospital Stay (HOSPITAL_COMMUNITY): Payer: Medicare Other

## 2017-07-21 LAB — GLUCOSE, CAPILLARY
GLUCOSE-CAPILLARY: 110 mg/dL — AB (ref 65–99)
Glucose-Capillary: 105 mg/dL — ABNORMAL HIGH (ref 65–99)
Glucose-Capillary: 164 mg/dL — ABNORMAL HIGH (ref 65–99)
Glucose-Capillary: 93 mg/dL (ref 65–99)
Glucose-Capillary: 90 mg/dL (ref 65–99)

## 2017-07-21 LAB — BASIC METABOLIC PANEL WITH GFR
Anion gap: 13 (ref 5–15)
BUN: 58 mg/dL — ABNORMAL HIGH (ref 6–20)
CO2: 22 mmol/L (ref 22–32)
Calcium: 8.3 mg/dL — ABNORMAL LOW (ref 8.9–10.3)
Chloride: 116 mmol/L — ABNORMAL HIGH (ref 101–111)
Creatinine, Ser: 2.03 mg/dL — ABNORMAL HIGH (ref 0.61–1.24)
GFR calc Af Amer: 38 mL/min — ABNORMAL LOW
GFR calc non Af Amer: 32 mL/min — ABNORMAL LOW
Glucose, Bld: 114 mg/dL — ABNORMAL HIGH (ref 65–99)
Potassium: 3.5 mmol/L (ref 3.5–5.1)
Sodium: 151 mmol/L — ABNORMAL HIGH (ref 135–145)

## 2017-07-21 LAB — APTT: aPTT: 34 s (ref 24–36)

## 2017-07-21 LAB — CBC
HCT: 24.7 % — ABNORMAL LOW (ref 39.0–52.0)
HEMOGLOBIN: 7.3 g/dL — AB (ref 13.0–17.0)
MCH: 26.6 pg (ref 26.0–34.0)
MCHC: 29.6 g/dL — AB (ref 30.0–36.0)
MCV: 90.1 fL (ref 78.0–100.0)
Platelets: 171 10*3/uL (ref 150–400)
RBC: 2.74 MIL/uL — ABNORMAL LOW (ref 4.22–5.81)
RDW: 17.4 % — ABNORMAL HIGH (ref 11.5–15.5)
WBC: 5.2 10*3/uL (ref 4.0–10.5)

## 2017-07-21 LAB — MAGNESIUM: MAGNESIUM: 2.4 mg/dL (ref 1.7–2.4)

## 2017-07-21 LAB — PROTIME-INR
INR: 1.25
PROTHROMBIN TIME: 15.6 s — AB (ref 11.4–15.2)

## 2017-07-21 LAB — PHOSPHORUS: Phosphorus: 3.5 mg/dL (ref 2.5–4.6)

## 2017-07-21 MED ORDER — VITAL AF 1.2 CAL PO LIQD
1000.0000 mL | ORAL | Status: DC
  Administered 2017-07-22: 1000 mL

## 2017-07-21 MED ORDER — VITAL HIGH PROTEIN PO LIQD
1000.0000 mL | ORAL | Status: DC
  Administered 2017-07-21: 1000 mL

## 2017-07-21 MED ORDER — ORAL CARE MOUTH RINSE: 15.0000 mL | Freq: Two times a day (BID) | OROMUCOSAL | Status: DC

## 2017-07-21 MED ORDER — POTASSIUM CHLORIDE 20 MEQ/15ML (10%) PO SOLN
40.0000 meq | Freq: Two times a day (BID) | ORAL | Status: DC
  Administered 2017-07-21 – 2017-07-25 (×10): 40 meq via ORAL
  Filled 2017-07-21 (×10): qty 30

## 2017-07-21 NOTE — Progress Notes (Signed)
Pharmacy Antibiotic Note  Leonard [name] is a 67 y.o. male admitted on 07/05/2017 with acute hypoxic respiratory failure with shortness of breath from CHF exacerbation. Noted to have fever overnight.  Pharmacy dosing Ceftazidime dosing for HCAP. On day #5 of abx. UOP improved on lasix drip and SCr remains stable.   Plan: Continue Ceftazidime 2 gm IV q12h. Likely can stop tomorrow given clinical progress  Monitor clinical progress including renal function and cultures.  Height: 5\' 8"  (172.7 cm) Weight: 184 lb 1.4 oz (83.5 kg) IBW/kg (Calculated) : 68.4  Temp (24hrs), Avg:99.1 F (37.3 C), Min:98.2 F (36.8 C), Max:99.7 F (37.6 C)  Recent Labs  Lab 07/17/17 0415 07/18/17 0507 07/18/17 0828 07/19/17 0326 07/19/17 2229 07/20/17 0458 07/21/17 0420  WBC 6.0 5.1  --  5.7  --  4.5 5.2  CREATININE 2.18* 2.11*  --  2.03* 1.93* 1.95* 2.03*  LATICACIDVEN  --   --  0.7  --   --   --   --     Estimated Creatinine Clearance: 37.7 mL/min (A) (by C-G formula based on SCr of 2.03 mg/dL (H)).    No Known Allergies  Antimicrobials this admission:  Vanc 6/5 >>6/9 Zosyn 6/5 >>6/10 Azith 6/7 >>6/9 Ceftazidime 6/15>>   Microbiology results:  6/7 RVP neg 6/5 blood-neg 6/5 resp-negative 6/4 blood- neg 6/4 MRSA PCR- neg  Albertina Parr, PharmD., BCPS Clinical Pharmacist Clinical phone for 07/21/17 until 3:30pm: [id number] If after 3:30pm, please call main pharmacy at: [phone]

## 2017-07-21 NOTE — Progress Notes (Signed)
eLink Physician-Brief Progress Note Patient Name: [name] DOB: 1950-02-20 MRN: [id number]   Date of Service  07/21/2017  HPI/Events of Note  Episode of hemoptysis - Currently on Heparin [place] and SCD's. CBC already pending for AM.   eICU Interventions  Will order: 1. D/C Heparin [place].  2. Continue SCD's. 3. PT/INR and PTT STAT.      Intervention Category Major Interventions: Other:  Lysle Dingwall 07/21/2017, 5:14 AM

## 2017-07-21 NOTE — Progress Notes (Signed)
Nutrition Follow-up / Consult  DOCUMENTATION CODES:   Not applicable  INTERVENTION:    Resume trickle TF with Vital AF 1.2 at 20 ml/h via Cortrak tube  As tolerance established, increase to goal rate of 75 ml/h (1800 ml per day) to provide 2160 kcal, 135 gm protein, 1460 ml free water daily  NUTRITION DIAGNOSIS:   Inadequate oral intake related to inability to eat as evidenced by NPO status.  Ongoing  GOAL:   Patient will meet greater than or equal to 90% of their needs  Unmet  MONITOR:   Skin, I & O's, Labs  REASON FOR ASSESSMENT:   Consult Enteral/tube feeding initiation and management  ASSESSMENT:   67 y.o. M admitted on 07/05/17 for acute respiratory failure with hypoxia and CHF exacerbation. PMH of HTN, PAD, CHF, CAD, hx of skin cancer, T2DM, hx of gangrene of toe 2014, CKD stg IV, and anemia. 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 Pt intubate 6/5.    Hemoptysis and bloody nose last night.  Received MD Consult for TF initiation and management. Post-pyloric Cortrak tube in place. Plans for trickle feeding to start today. SLP following for ability to complete swallow evaluation when lethargy improves. Labs reviewed. Sodium 151 (H)--receiving free water flushes 200 ml every 4 hours CBG's: [phone] Medications reviewed and include ferrous sulfate.   Diet Order:   Diet Order           Diet NPO time specified  Diet effective now          EDUCATION NEEDS:   Not appropriate for education at this time  Skin:  Skin Assessment: Skin Integrity Issues: Skin Integrity Issues:: Other (Comment), Stage II Stage II: coccyx Other: R foot wound  Last BM:  6/18 (rectal tube)  Height:   Ht Readings from Last 1 Encounters:  07/05/17 5\' 8"  (1.727 m)    Weight:   Wt Readings from Last 1 Encounters:  07/21/17 184 lb 1.4 oz (83.5 kg)    Ideal Body Weight:  64.4 kg  BMI:  Body mass index is 27.99 kg/m.  Estimated Nutritional Needs:   Kcal:  2100-2300 kcal  Protein:   120-135 g  Fluid:  2.1 L    Molli Barrows, RD, LDN, CNSC Pager [phone] After Hours Pager [phone]

## 2017-07-21 NOTE — Progress Notes (Signed)
SLP Cancellation Note  Patient Details Name: Leonard [name] MRN: [id number] DOB: 09/05/1950   Cancelled treatment:       Reason Eval/Treat Not Completed: Fatigue/lethargy limiting ability to participate per discussion with RN. Will f/u as schedule allows when more alert.   [name] 07/21/2017, 10:30 AM  [name], M.A. CCC-SLP [phone]

## 2017-07-21 NOTE — Clinical Social Work Note (Signed)
Clinical Social Work Assessment  Patient Details  Name: Leonard [name] MRN: [id number] Date of Birth: February 09, 1950  Date of referral:  07/21/17               Reason for consult:  Facility Placement(from [place] )                Permission sought to share information with:  Family Supports Permission granted to share information::  Yes, Release of Information Signed  Name::     Elwin Sleight   Agency::  family  Relationship::  sister   Contact Information:  Elwin Sleight [phone]  Housing/Transportation Living arrangements for the past 2 months:  [place] of Information:  Patient Patient Interpreter Needed:  None Criminal Activity/Legal Involvement Pertinent to Current Situation/Hospitalization:  No - Comment as needed Significant Relationships:  Neighbor, Siblings Lives with:  Facility Resident Do you feel safe going back to the place where you live?  Yes Need for family participation in patient care:  Yes (Comment)  Care giving concerns:  CSW spoke with pt at bedside. At this time pt denies having any concerns to CSW.    Social Worker assessment / plan:  CSW spoke with pt at bedside. CSW was informed that pt is from [facility]. Pt unsure of how long pt has been at this facility. Pt expressed that sisters are a source of support for pt during this time as well as when pt needs anything. Pt expressed that after discharge from the hospital pt plans to be discharged home. CSW spoke with pt and expressed that CSW was not sure if pt could go home after discharge as pt may need more rehab from the facility. Pt expressed being agreeable to this.   During this assessment pt was lying in bed and made good eye contact with CSW. Pt answered questions appropriately and to the best of pt's knowledge.   Employment status:  Retired Forensic scientist:  Other (Comment Required)(Medicare Part B) PT Recommendations:    Information / Referral to  community resources:  Skilled Nursing Facility(spoke with pt about being from [facility])  Patient/Family's Response to care:  Pt's response to care was appropriate and understanding for diagnosis given.  Patient/Family's Understanding of and Emotional Response to Diagnosis, Current Treatment, and Prognosis:  No further questions or concerns have been presented to CSW at this time. Emotional response included having a positive thought process to barriers that may limit pt from going directly home at the time of discharge.   Emotional Assessment Appearance:  Appears stated age Attitude/Demeanor/Rapport:  Engaged Affect (typically observed):  Appropriate, Pleasant Orientation:  Oriented to Self, Oriented to Place, Oriented to  Time, Oriented to Situation, Fluctuating Orientation (Suspected and/or reported Sundowners) Alcohol / Substance use:  Not Applicable Psych involvement (Current and /or in the community):  No (Comment)  Discharge Needs  Concerns to be addressed:  Denies Needs/Concerns at this time Readmission within the last 30 days:  No Current discharge risk:  Dependent with Mobility Barriers to Discharge:  Continued Medical Work up   [employer], [place] 07/21/2017, 11:29 AM

## 2017-07-21 NOTE — Care Management Note (Signed)
Case Management Note Previous Note Created by Marvetta Gibbons RN,BSN Unit [facility] Case Manager  [phone]  Patient Details  Name: MARIBEL LUIS MRN: [id number] Date of Birth: [birth year]  Subjective/Objective:  Pt admitted with acute hypoxic respiratory failure secondary to acute decompensated diastolic heart failure. Pt continued to worsen despite BiPAP.  He was eventually intubated, self ext. 6/12- on HFNC                Action/Plan: PTA pt lived at home, CM to follow for transition of care needs, PT eval ordered and pending on 6/13  Expected Discharge Date:                  Expected Discharge Plan:     In-House Referral:     Discharge planning Services  CM Consult  Post Acute Care Choice:    Choice offered to:     DME Arranged:    DME Agency:     HH Arranged:    [place] Agency:     Status of Service:  In process, will continue to follow  If discussed at Long Length of Stay Meetings, dates discussed:    Discharge Disposition:   Additional Comments: 07/21/2017 Pt transferred to ICU in resp distress.  CM will continue to follow.  SNF recommended - CSW following  Maryclare Labrador, RN 07/21/2017, 4:19 PM

## 2017-07-21 NOTE — Progress Notes (Addendum)
PCCM Progress Note  Admission date: 07/05/2017 CC: short of breath  HPI: 67 yo male presented with shortness of breath from CHF exacerbation, pleural effusion, aspiration pneumonia.  Tried on Bipap and high flow [place].    PMHx: CKD 4, CAD s/p CABG, DM, HTN, HLD, DVT on eliquis.  Subjective: tx ICU overnight for worsening respiratory failure/ hypoxia.  Now on HFNC.  .  DNI, DNR, bipap only per goals of care conference.Remains on 25L HFNC ( 40%) Hemoptysis over night Negative 4 L , but HF worse on CXR  Vital signs: BP (!) 125/59   Pulse 77   Temp 98.2 F (36.8 C) (Oral)   Resp (!) 26   Ht 5\' 8"  (1.727 m)   Wt 184 lb 1.4 oz (83.5 kg)   SpO2 96%   BMI 27.99 kg/m   Intake/output: I/O last 3 completed shifts: In: 3526.3 [I.V.:2067.3; NG/GT:1259; IV Piggyback:200] Out: 1478 [Urine:3595; Stool:50]  Physical exam:  General - chronically ill appearing male, NAD, on HFNC  HEENT - NCAT,  mm moist, no JVD, No LAD Cardiac -s1s2 RRR, No RMG Chest - resps even non labored on HFNC, crackles per bases, few rhonchi Abd - soft, non tender, ND, BS + Ext - scant BLE edema  Skin - no rashes, warm dry and intact Neuro - awake, alert, interactive, confused at times.   Labs: CBC Recent Labs    07/19/17 0326 07/20/17 0458 07/21/17 0420  WBC 5.7 4.5 5.2  HGB 7.9* 7.2* 7.3*  HCT 26.0* 24.7* 24.7*  PLT 163 160 171    Coag's Recent Labs    07/21/17 0801  APTT 34  INR 1.25    BMET Recent Labs    07/19/17 2229 07/20/17 0458 07/21/17 0420  NA 148* 153* 151*  K 3.7 3.6 3.5  CL 114* 118* 116*  CO2 22 25 22   BUN 72* 68* 58*  CREATININE 1.93* 1.95* 2.03*  GLUCOSE 181* 113* 114*    Electrolytes Recent Labs    07/19/17 2229 07/20/17 0458 07/21/17 0420  CALCIUM 8.0* 8.3* 8.3*  MG 2.4 2.5* 2.4  PHOS 3.7 3.7 3.5    Sepsis Markers Recent Labs    07/19/17 0326 07/20/17 0458  PROCALCITON 0.17 0.19    ABG Recent Labs    07/19/17 2143  PHART 7.432  PCO2ART 36.4   PO2ART 93.2    Liver Enzymes Recent Labs    07/19/17 0326  AST 15  ALT 11*  ALKPHOS 70  BILITOT 0.7  ALBUMIN 2.6*    Cardiac Enzymes Recent Labs    07/19/17 2229  TROPONINI 0.05*    Glucose Recent Labs    07/20/17 1148 07/20/17 1523 07/20/17 2014 07/20/17 2324 07/21/17 0336 07/21/17 0732  GLUCAP 111* 125* 116* 138* 105* 164*    Imaging Dg Chest Portable 1 View  Result Date: 07/21/2017 CLINICAL DATA:  Respiratory failure. EXAM: PORTABLE CHEST 1 VIEW COMPARISON:  07/19/2017. FINDINGS: Prior CABG. Persistent cardiomegaly. Vascular calcification. Progressive diffuse bilateral pulmonary infiltrates/edema and bilateral pleural effusions. Findings suggest CHF. No pneumothorax. IMPRESSION: Prior CABG. Persistent cardiomegaly. Progressive diffuse bilateral pulmonary infiltrates/edema bilateral pleural effusions suggesting progression of CHF. Electronically Signed   By: Marcello Moores  Register   On: 07/21/2017 06:24   Dg Chest Port 1 View  Result Date: 07/19/2017 CLINICAL DATA:  Acute respiratory failure EXAM: PORTABLE CHEST 1 VIEW COMPARISON:  07/19/2017, 07/18/2017, 07/16/2017 FINDINGS: Post sternotomy changes. Esophageal tube tip is below the diaphragm but non included. Cardiomegaly with worsening vascular congestion and pulmonary edema.  Small bilateral pleural effusions, right greater than left. No pneumothorax. IMPRESSION: Cardiomegaly with increasing vascular congestion and pulmonary edema. Small bilateral right greater than left pleural effusions. Electronically Signed   By: Donavan Foil M.D.   On: 07/19/2017 23:00    Studies: PFT 03/17/16 >> FEV1 2.25 (81%), FEV1% 88, TLC 3.70 (55%), DLCO 46% Echo 07/06/17 >> mild LVH, EF 65 to 70%, grade 2 DD, PAS 46 mmHg CT chest 07/11/17 >> interstitial edema, basilar consolidation, b/l effusions CT chest 6/15 >> chronic small vessel ischemic changes, advanced atherosclerosis of large vessels  Cultures: Blood 6/04 >> negative Sputum 6/05  >> negative Blood 6/05 >> negative Respiratory viral panel 6/07 >> negative  Antibiotics: Vancomycin 6/05 >> 6/09 Zosyn 6/05 >> 6/09 Tressie Ellis 6/15 >>   Discussion: 67 yo male with acute hypoxic respiratory failure.  Most of this seems to be related to aspiration pneumonia.  He has been diuresed, and now Na increasing.  Reported to have COPD, but PFT from February 2018 not consistent with this.  Assessment/plan:  Acute hypoxic respiratory failure. CXR 6/18>>cardiomegally, progressive bilateral pulmonary infiltrates, edema 2/2 worsening CHF PLAN -  Continue HFNC and titrate/ wean  as able to keep sats 88-92% DNI  bipap PRN  abx as below    HCAP. PLAN -  day 4 of ABX>> Consider 5 day course as patient is afebrile and WBC normal f/u CXR intermittently Re culture as is clinically indicated  Dysphagia. Some ??aspiration of TF overnight PLAN -  NPO Hold off on resuming TF for now  Speech f/u   HFpEF, HLD. Worsening HF per CXR 6/18 PLAN -  Continue lasix gtt  norvasc, lipitor, coreg, catapres, cardura, hydralazine, minoxidil Trend CXR Trend output Trend BNP  Hypernatremia. Hypokalemia CKD 4. Creatinine up trending PLAN -  Continue D10 Free H2O , 200 cc every 4 hours F/u chem  Lasix as above as renal function allows  Anemia of critical illness and chronic disease. Episode of Hemoptysis 6/18 early am PLAN -  SQ heparin  F/u CBC  Transfuse for HGB < 7 Coags prn  DM type II. Hypothyroidism. PLAN -  Continue lantus, SSI  Synthroid   Altered mental status. - improving slowly.  PLAN -  Supportive care    DVT prophylaxis - SQ heparin SUP - not indicated Nutrition - NPO except meds Goals of care - full code  No family at bedside 6/18.. Goals of care discussion reviewed. Continue current rx, wean O2 as able.  May need to discuss further, consider transition to comfort if he worsens or does not show significant improvement over next few days   Magdalen Spatz, AGACNP-BC 07/21/2017  10:16 AM Pager:  [phone]

## 2017-07-22 ENCOUNTER — Encounter (HOSPITAL_COMMUNITY): Payer: Medicare Other

## 2017-07-22 ENCOUNTER — Ambulatory Visit: Payer: Medicare Other | Admitting: Vascular Surgery

## 2017-07-22 LAB — GLUCOSE, CAPILLARY
GLUCOSE-CAPILLARY: 105 mg/dL — AB (ref 65–99)
GLUCOSE-CAPILLARY: 107 mg/dL — AB (ref 65–99)
GLUCOSE-CAPILLARY: 128 mg/dL — AB (ref 65–99)
GLUCOSE-CAPILLARY: 174 mg/dL — AB (ref 65–99)
GLUCOSE-CAPILLARY: 185 mg/dL — AB (ref 65–99)
Glucose-Capillary: 122 mg/dL — ABNORMAL HIGH (ref 65–99)
Glucose-Capillary: 155 mg/dL — ABNORMAL HIGH (ref 65–99)

## 2017-07-22 LAB — BRAIN NATRIURETIC PEPTIDE
B NATRIURETIC PEPTIDE 5: 340.8 pg/mL — AB (ref 0.0–100.0)
B Natriuretic Peptide: 372.8 pg/mL — ABNORMAL HIGH (ref 0.0–100.0)

## 2017-07-22 LAB — CBC
HCT: 25.6 % — ABNORMAL LOW (ref 39.0–52.0)
HEMOGLOBIN: 7.6 g/dL — AB (ref 13.0–17.0)
MCH: 26.9 pg (ref 26.0–34.0)
MCHC: 29.7 g/dL — ABNORMAL LOW (ref 30.0–36.0)
MCV: 90.5 fL (ref 78.0–100.0)
Platelets: 201 10*3/uL (ref 150–400)
RBC: 2.83 MIL/uL — ABNORMAL LOW (ref 4.22–5.81)
RDW: 17.4 % — ABNORMAL HIGH (ref 11.5–15.5)
WBC: 5.6 10*3/uL (ref 4.0–10.5)

## 2017-07-22 LAB — BASIC METABOLIC PANEL
ANION GAP: 9 (ref 5–15)
BUN: 50 mg/dL — ABNORMAL HIGH (ref 6–20)
CHLORIDE: 116 mmol/L — AB (ref 101–111)
CO2: 24 mmol/L (ref 22–32)
Calcium: 8.4 mg/dL — ABNORMAL LOW (ref 8.9–10.3)
Creatinine, Ser: 2.08 mg/dL — ABNORMAL HIGH (ref 0.61–1.24)
GFR calc non Af Amer: 32 mL/min — ABNORMAL LOW (ref >60)
GFR, EST AFRICAN AMERICAN: 37 mL/min — AB (ref >60)
GLUCOSE: 137 mg/dL — AB (ref 65–99)
Potassium: 4 mmol/L (ref 3.5–5.1)
Sodium: 149 mmol/L — ABNORMAL HIGH (ref 135–145)

## 2017-07-22 LAB — PROTIME-INR
INR: 1.17
Prothrombin Time: 14.8 seconds (ref 11.4–15.2)

## 2017-07-22 MED ORDER — HYDRALAZINE HCL 50 MG PO TABS
100.0000 mg | ORAL_TABLET | Freq: Three times a day (TID) | ORAL | Status: DC
  Administered 2017-07-22 – 2017-07-30 (×23): 100 mg
  Filled 2017-07-22 (×26): qty 2

## 2017-07-22 MED ORDER — METHYLPREDNISOLONE SODIUM SUCC 125 MG IJ SOLR
80.0000 mg | Freq: Two times a day (BID) | INTRAMUSCULAR | Status: DC
  Administered 2017-07-22 – 2017-07-24 (×5): 80 mg via INTRAVENOUS
  Filled 2017-07-22 (×5): qty 2

## 2017-07-22 MED ORDER — FERROUS SULFATE 300 (60 FE) MG/5ML PO SYRP
300.0000 mg | ORAL_SOLUTION | Freq: Two times a day (BID) | ORAL | Status: DC
  Administered 2017-07-22 – 2017-07-30 (×16): 300 mg
  Filled 2017-07-22 (×18): qty 5

## 2017-07-22 MED ORDER — AMLODIPINE BESYLATE 10 MG PO TABS
10.0000 mg | ORAL_TABLET | Freq: Every day | ORAL | Status: DC
  Administered 2017-07-23 – 2017-07-30 (×8): 10 mg
  Filled 2017-07-22 (×8): qty 1

## 2017-07-22 MED ORDER — CARVEDILOL 25 MG PO TABS
25.0000 mg | ORAL_TABLET | Freq: Two times a day (BID) | ORAL | Status: DC
  Administered 2017-07-22 – 2017-07-30 (×16): 25 mg
  Filled 2017-07-22 (×17): qty 1

## 2017-07-22 MED ORDER — ALLOPURINOL 100 MG PO TABS
100.0000 mg | ORAL_TABLET | Freq: Every day | ORAL | Status: DC
  Administered 2017-07-23 – 2017-07-30 (×8): 100 mg
  Filled 2017-07-22 (×8): qty 1

## 2017-07-22 MED ORDER — DOXAZOSIN MESYLATE 8 MG PO TABS
8.0000 mg | ORAL_TABLET | Freq: Every day | ORAL | Status: DC
  Administered 2017-07-22 – 2017-07-29 (×8): 8 mg
  Filled 2017-07-22 (×8): qty 1

## 2017-07-22 MED ORDER — MINOXIDIL 2.5 MG PO TABS
5.0000 mg | ORAL_TABLET | Freq: Every day | ORAL | Status: DC
  Administered 2017-07-23 – 2017-07-30 (×8): 5 mg
  Filled 2017-07-22 (×8): qty 2

## 2017-07-22 MED ORDER — ISOSORBIDE DINITRATE 20 MG PO TABS
20.0000 mg | ORAL_TABLET | Freq: Three times a day (TID) | ORAL | Status: DC
  Administered 2017-07-22 – 2017-07-30 (×23): 20 mg
  Filled 2017-07-22 (×26): qty 1

## 2017-07-22 MED ORDER — ATORVASTATIN CALCIUM 40 MG PO TABS
40.0000 mg | ORAL_TABLET | Freq: Every day | ORAL | Status: DC
  Administered 2017-07-22 – 2017-07-29 (×8): 40 mg
  Filled 2017-07-22 (×8): qty 1

## 2017-07-22 MED ORDER — TRAZODONE HCL 50 MG PO TABS
50.0000 mg | ORAL_TABLET | Freq: Every day | ORAL | Status: DC
  Administered 2017-07-22 – 2017-07-27 (×5): 50 mg via ORAL
  Filled 2017-07-22 (×6): qty 1

## 2017-07-22 NOTE — Progress Notes (Signed)
PCCM Progress Note  Admission date: 07/05/2017 CC: short of breath  HPI: 67 yo male presented with shortness of breath from CHF exacerbation, pleural effusion, aspiration pneumonia.  Tried on Bipap and high flow [place].    PMHx: CKD 4, CAD s/p CABG, DM, HTN, HLD, DVT on eliquis.  Subjective: No sig change overnight.  Remains on lasix gtt. Scr stable   Vital signs: BP (!) 143/61 (BP Location: Right Arm)   Pulse 86   Temp 98.7 F (37.1 C) (Oral)   Resp (!) 32   Ht 5\' 8"  (1.727 m)   Wt 83.1 kg (183 lb 3.2 oz)   SpO2 (!) 87%   BMI 27.86 kg/m   Intake/output: I/O last 3 completed shifts: In: 2640 [I.V.:1074.3; NG/GT:1365.7; IV Piggyback:200] Out: 3154 [Urine:3540; Stool:500]  Physical exam:  General - chronically ill appearing male, NAD, on HFNC  HEENT - NCAT,  mm moist, no JVD, No LAD Cardiac -s1s2 RRR, No RMG Chest - resps even non labored on HFNC, crackles per bases, few rhonchi Abd - soft, non tender, ND, BS + Ext - scant BLE edema  Skin - no rashes, warm dry and intact Neuro - awake, alert, interactive, confused at times.   Labs: CBC Recent Labs    07/20/17 0458 07/21/17 0420 07/22/17 0444  WBC 4.5 5.2 5.6  HGB 7.2* 7.3* 7.6*  HCT 24.7* 24.7* 25.6*  PLT 160 171 201    Coag's Recent Labs    07/21/17 0801 07/22/17 0444  APTT 34  --   INR 1.25 1.17    BMET Recent Labs    07/20/17 0458 07/21/17 0420 07/22/17 0444  NA 153* 151* 149*  K 3.6 3.5 4.0  CL 118* 116* 116*  CO2 25 22 24   BUN 68* 58* 50*  CREATININE 1.95* 2.03* 2.08*  GLUCOSE 113* 114* 137*    Electrolytes Recent Labs    07/19/17 2229 07/20/17 0458 07/21/17 0420 07/22/17 0444  CALCIUM 8.0* 8.3* 8.3* 8.4*  MG 2.4 2.5* 2.4  --   PHOS 3.7 3.7 3.5  --     Sepsis Markers Recent Labs    07/20/17 0458  PROCALCITON 0.19    ABG Recent Labs    07/19/17 2143  PHART 7.432  PCO2ART 36.4  PO2ART 93.2    Liver Enzymes No results for input(s): AST, ALT, ALKPHOS, BILITOT,  ALBUMIN in the last 72 hours.  Cardiac Enzymes Recent Labs    07/19/17 2229  TROPONINI 0.05*    Glucose Recent Labs    07/21/17 1147 07/21/17 1625 07/21/17 2008 07/22/17 0013 07/22/17 0352 07/22/17 0801  GLUCAP 93 90 110* 107* 128* 122*    Imaging Dg Chest Portable 1 View  Result Date: 07/21/2017 CLINICAL DATA:  Respiratory failure. EXAM: PORTABLE CHEST 1 VIEW COMPARISON:  07/19/2017. FINDINGS: Prior CABG. Persistent cardiomegaly. Vascular calcification. Progressive diffuse bilateral pulmonary infiltrates/edema and bilateral pleural effusions. Findings suggest CHF. No pneumothorax. IMPRESSION: Prior CABG. Persistent cardiomegaly. Progressive diffuse bilateral pulmonary infiltrates/edema bilateral pleural effusions suggesting progression of CHF. Electronically Signed   By: Marcello Moores  Register   On: 07/21/2017 06:24    Studies: PFT 03/17/16 >> FEV1 2.25 (81%), FEV1% 88, TLC 3.70 (55%), DLCO 46% Echo 07/06/17 >> mild LVH, EF 65 to 70%, grade 2 DD, PAS 46 mmHg CT chest 07/11/17 >> interstitial edema, basilar consolidation, b/l effusions CT chest 6/15 >> chronic small vessel ischemic changes, advanced atherosclerosis of large vessels  Cultures: Blood 6/04 >> negative Sputum 6/05 >> negative Blood 6/05 >> negative Respiratory  viral panel 6/07 >> negative  Antibiotics: Vancomycin 6/05 >> 6/09 Zosyn 6/05 >> 6/09 Tressie Ellis 6/15 >>   Discussion: 67 yo male with acute hypoxic respiratory failure r/t recurrent aspiration and pulmonary edema. Diuresing. NPO.  S/p 5 days abx.   Assessment/plan:  Acute hypoxic respiratory failure r/t aspiration, pulmonary edema CXR 6/18>>cardiomegally, progressive bilateral pulmonary infiltrates, edema 2/2 worsening CHF PLAN -  Continue HFNC and titrate/ wean  as able to keep sats 88-92% DNI  bipap PRN  abx as below    HCAP. PLAN -  day 5/5 abx - will d/c after today's dose f/u CXR intermittently   Dysphagia. ??aspiration of TF  PLAN -   NPO Tolerating trickle TF  Speech f/u   HFpEF, HLD. Worsening HF per CXR 6/18 PLAN -  Continue lasix gtt @10mg /hr  norvasc, lipitor, coreg, catapres, cardura, hydralazine, minoxidil Trend CXR Trend output Trend BNP  Hypernatremia. Hypokalemia CKD 4. Creatinine up trending PLAN -  Continue D5W @20m /hr  Free H2O , 200 cc every 4 hours F/u chem  Lasix gtt as above as renal function allows  Anemia of critical illness and chronic disease. Episode of Hemoptysis 6/18 early am PLAN -  SQ heparin  F/u CBC  Transfuse for HGB < 7 Coags prn  DM type II. Hypothyroidism. PLAN -  Continue lantus, SSI  Synthroid   Altered mental status. - improving slowly.  PLAN -  Supportive care    DVT prophylaxis - SQ heparin SUP - not indicated Nutrition - NPO except meds Goals of care - full code  No family at bedside 6/19.  Need to continue goals of care discussions.  He is fairly unchanged despite aggressive diuresis (neg 3L today), 5 days abx.  Ongoing hypoxia despite HFNC.    Nickolas Madrid, NP 07/22/2017  9:44 AM Pager: [phone] or [phone]

## 2017-07-22 NOTE — Progress Notes (Signed)
  Speech Language Pathology Treatment: Dysphagia  Patient Details Name: Leonard [name] MRN: [id number] DOB: 1950-11-12 Today's Date: 07/22/2017 Time: 0017-4944 SLP Time Calculation (min) (ACUTE ONLY): 11 min  Assessment / Plan / Recommendation Clinical Impression  Pt remains drowsy, needing Mod cues for alertness and sustained attention to swallowing exercises, drifting off to sleep quickly without stimulation. He performed 5 effortful swallows with Mod-Max cues and use of oral care/minimal amounts of water via swab to stimulate a swallow response. Other POs were held given lethargy and persistent high O2 needs. SLP also introduced CTAR today with Mod cues provided for pt to complete 10 repetitions. Would continue NPO status for now pending improved mentation, respirations.    HPI HPI: 67 year old man with a history of diabetes, peripheral vascular disease, hypertension and chronic kidney disease.  He was admitted with orthopnea that progressed quickly to fulminant respiratory failure.  He required mechanical ventilation 6/5 and for a period of time was on high [name], neuromuscular blockade.  He has been aggressively diuresed and blood pressure tightly controlled.  He self extubated and 6/12 and has tolerated. Pulled Cortrak.       SLP Plan  Continue with current plan of care       Recommendations  Diet recommendations: NPO Medication Administration: Via alternative means                Oral Care Recommendations: Oral care QID Follow up Recommendations: Skilled Nursing facility SLP Visit Diagnosis: Dysphagia, oropharyngeal phase (R13.12) Plan: Continue with current plan of care       GO                Leonard [name] 07/22/2017, 11:47 AM  Leonard [name], M.A. CCC-SLP [phone]

## 2017-07-23 ENCOUNTER — Inpatient Hospital Stay (HOSPITAL_COMMUNITY): Payer: Medicare Other

## 2017-07-23 LAB — GLUCOSE, CAPILLARY
GLUCOSE-CAPILLARY: 180 mg/dL — AB (ref 65–99)
GLUCOSE-CAPILLARY: 202 mg/dL — AB (ref 65–99)
GLUCOSE-CAPILLARY: 176 mg/dL — AB (ref 65–99)
GLUCOSE-CAPILLARY: 208 mg/dL — AB (ref 65–99)
Glucose-Capillary: 188 mg/dL — ABNORMAL HIGH (ref 65–99)
Glucose-Capillary: 210 mg/dL — ABNORMAL HIGH (ref 65–99)

## 2017-07-23 LAB — BASIC METABOLIC PANEL
Anion gap: 14 (ref 5–15)
BUN: 48 mg/dL — ABNORMAL HIGH (ref 6–20)
CHLORIDE: 115 mmol/L — AB (ref 101–111)
CO2: 20 mmol/L — ABNORMAL LOW (ref 22–32)
CREATININE: 2.14 mg/dL — AB (ref 0.61–1.24)
Calcium: 8.7 mg/dL — ABNORMAL LOW (ref 8.9–10.3)
GFR calc non Af Amer: 30 mL/min — ABNORMAL LOW (ref >60)
GFR, EST AFRICAN AMERICAN: 35 mL/min — AB (ref >60)
Glucose, Bld: 199 mg/dL — ABNORMAL HIGH (ref 65–99)
Potassium: 4.6 mmol/L (ref 3.5–5.1)
SODIUM: 149 mmol/L — AB (ref 135–145)

## 2017-07-23 LAB — CBC
HCT: 26.7 % — ABNORMAL LOW (ref 39.0–52.0)
HEMOGLOBIN: 7.9 g/dL — AB (ref 13.0–17.0)
MCH: 26.8 pg (ref 26.0–34.0)
MCHC: 29.6 g/dL — AB (ref 30.0–36.0)
MCV: 90.5 fL (ref 78.0–100.0)
Platelets: 202 10*3/uL (ref 150–400)
RBC: 2.95 MIL/uL — AB (ref 4.22–5.81)
RDW: 17.4 % — ABNORMAL HIGH (ref 11.5–15.5)
WBC: 5.3 10*3/uL (ref 4.0–10.5)

## 2017-07-23 MED ORDER — VITAL AF 1.2 CAL PO LIQD
1000.0000 mL | ORAL | Status: DC
  Administered 2017-07-23 – 2017-07-28 (×6): 1000 mL
  Filled 2017-07-23 (×8): qty 1000

## 2017-07-23 MED ORDER — VITAL AF 1.2 CAL PO LIQD: 1000.0000 mL | ORAL | Status: DC

## 2017-07-23 NOTE — Progress Notes (Signed)
FPTS Social Note  Family Medicine Teach Service continues to follow Mr. Pothier in the ICU.  Examined Mr. Horace at bedside.  We appreciate the care given to him by CCM and will resume care if Mr. Skoda comes out to the floor.  Amanda [name], MD PGY-1, [facility] 07/23/2017 9:24 AM

## 2017-07-23 NOTE — Progress Notes (Signed)
PCCM Progress Note  Admission date: 07/05/2017 CC: short of breath  HPI: 67 yo male presented with shortness of breath from CHF exacerbation, pleural effusion, aspiration pneumonia.  Tried on Bipap and high flow [place].    PMHx: CKD 4, CAD s/p CABG, DM, HTN, HLD, DVT on eliquis.  Subjective: Looks a bit better this am.  Remains on HFNC but perhaps a bit less dyspneic.   Vital signs: BP (!) 151/58   Pulse 93   Temp 99.5 F (37.5 C) (Oral)   Resp (!) 26   Ht 5\' 8"  (1.727 m)   Wt 80.7 kg (177 lb 14.6 oz)   SpO2 95%   BMI 27.05 kg/m   Intake/output: I/O last 3 completed shifts: In: 3631.8 [I.V.:2259.9; NG/GT:1259.3; IV Piggyback:112.6] Out: 1062 [Urine:3235; Stool:1000]  Physical exam:  General - chronically ill appearing male, NAD HEENT - NCAT,  mm moist, no JVD, No LAD Cardiac -s1s2 rrr  Chest - resps even non labored on HFNC, bibasilar crackles, few scattered rhonchi, no audible wheeze  Abd - soft, non tender, ND, BS + Ext - scant BLE edema  Skin - no rashes, warm dry and intact Neuro - sleepy this am but wakes easily, slightly confused, follows commands   Labs: CBC Recent Labs    07/21/17 0420 07/22/17 0444 07/23/17 0323  WBC 5.2 5.6 5.3  HGB 7.3* 7.6* 7.9*  HCT 24.7* 25.6* 26.7*  PLT 171 201 202    Coag's Recent Labs    07/21/17 0801 07/22/17 0444  APTT 34  --   INR 1.25 1.17    BMET Recent Labs    07/21/17 0420 07/22/17 0444 07/23/17 0323  NA 151* 149* 149*  K 3.5 4.0 4.6  CL 116* 116* 115*  CO2 22 24 20*  BUN 58* 50* 48*  CREATININE 2.03* 2.08* 2.14*  GLUCOSE 114* 137* 199*    Electrolytes Recent Labs    07/21/17 0420 07/22/17 0444 07/23/17 0323  CALCIUM 8.3* 8.4* 8.7*  MG 2.4  --   --   PHOS 3.5  --   --     Sepsis Markers No results for input(s): PROCALCITON, O2SATVEN in the last 72 hours.  Invalid input(s): LACTICACIDVEN  ABG No results for input(s): PHART, PCO2ART, PO2ART in the last 72 hours.  Liver Enzymes No  results for input(s): AST, ALT, ALKPHOS, BILITOT, ALBUMIN in the last 72 hours.  Cardiac Enzymes No results for input(s): TROPONINI, PROBNP in the last 72 hours.  Glucose Recent Labs    07/22/17 1117 07/22/17 1521 07/22/17 1939 07/22/17 2323 07/23/17 0317 07/23/17 0739  GLUCAP 155* 105* 185* 174* 188* 180*    Imaging Dg Chest Portable 1 View  Result Date: 07/23/2017 CLINICAL DATA:  Respiratory failure EXAM: PORTABLE CHEST 1 VIEW COMPARISON:  07/21/2017 FINDINGS: Cardiomegaly is stable. Nasal intestinal feeding tube is stable throughout its course in the esophagus. Diffuse bilateral airspace disease likely related to edema is stable. Right pleural effusion is stable. No pneumothorax. IMPRESSION: Stable pulmonary edema and right pleural effusion. Electronically Signed   By: Marybelle Killings M.D.   On: 07/23/2017 08:23    Studies: PFT 03/17/16 >> FEV1 2.25 (81%), FEV1% 88, TLC 3.70 (55%), DLCO 46% Echo 07/06/17 >> mild LVH, EF 65 to 70%, grade 2 DD, PAS 46 mmHg CT chest 07/11/17 >> interstitial edema, basilar consolidation, b/l effusions CT chest 6/15 >> chronic small vessel ischemic changes, advanced atherosclerosis of large vessels  Cultures: Blood 6/04 >> negative Sputum 6/05 >> negative Blood 6/05 >>  negative Respiratory viral panel 6/07 >> negative  Antibiotics: Vancomycin 6/05 >> 6/09 Zosyn 6/05 >> 6/09 Tressie Ellis 6/15 >>6/19   Discussion: 67 yo male with acute hypoxic respiratory failure r/t recurrent aspiration and pulmonary edema. Diuresing. NPO.  S/p 5 days abx.   Assessment/plan:  Acute hypoxic respiratory failure r/t aspiration, pulmonary edema, HCAP ??eosinophilic PNA ?NSIP - steroids added 6/19 PLAN -  Continue HFNC  NPO  DNI  Bipap PRN - has not needed  S/p 5 days abx  Continue IV steroids -- seems to have had improvement with this + diuresis  Continue diuresis as below  Mobilize  Pulmonary hygiene   HCAP. PLAN -  S/p 5 day course abx  f/u CXR  intermittently   Dysphagia. ??aspiration of TF  PLAN -  NPO Speech f/u   HFpEF, HLD. Worsening HF per CXR 6/18 PLAN -  Continue lasix gtt @10mg /hr - Scr elevated but stable - now net NEG 3L - may need to back off a bit over next 24-48 hrs  norvasc, lipitor, coreg, catapres, cardura, hydralazine, minoxidil Trend CXR Trend output Trend BNP  Hypernatremia. Hypokalemia CKD 4. Creatinine up trending PLAN -  Continue D5W Continue free water  F/u chem  Continue lasix as above    Anemia of critical illness and chronic disease. Episode of Hemoptysis 6/18 early am PLAN -  SQ heparin  F/u CBC    DM type II. Hypothyroidism. PLAN -  Lantus, SSI  Continue synthroid  Watch with addition steroids - may need to increase lantus - hold off for now  Altered mental status. - improving slowly.  PLAN -  Supportive care  Mobilize    DVT prophylaxis - SQ heparin SUP - not indicated Nutrition - NPO except meds Goals of care - DNR, DNI - bipap only   No family at bedside 6/20.  Dr. Lake Bells updated sister via phone 6/19.    Nickolas Madrid, NP 07/23/2017  9:26 AM Pager: [phone] or [phone]

## 2017-07-23 NOTE — Progress Notes (Signed)
  Speech Language Pathology Treatment: Dysphagia  Patient Details Name: Leonard [name] MRN: [id number] DOB: 1950-04-28 Today's Date: 07/23/2017 Time: 7871-8367 SLP Time Calculation (min) (ACUTE ONLY): 9 min  Assessment / Plan / Recommendation Clinical Impression  Pt is more lethargic today, only performing two effortful swallows despite Max multimodal cueing. He does open his mouth to stimulation and allows oral care with minimal resistance. Given his level of lethargy and high O2 needs, swabs were used for oral stimulation but POs were not given. Will continue to follow for readiness to try POs versus repeat objective testing as alertness improves and supplemental O2 needs decline.    HPI HPI: 67 year old man with a history of diabetes, peripheral vascular disease, hypertension and chronic kidney disease.  He was admitted with orthopnea that progressed quickly to fulminant respiratory failure.  He required mechanical ventilation 6/5 and for a period of time was on high [name], neuromuscular blockade.  He has been aggressively diuresed and blood pressure tightly controlled.  He self extubated and 6/12 and has tolerated. Pulled Cortrak.       SLP Plan  Continue with current plan of care       Recommendations  Diet recommendations: NPO Medication Administration: Via alternative means                Oral Care Recommendations: Oral care QID Follow up Recommendations: Skilled Nursing facility SLP Visit Diagnosis: Dysphagia, oropharyngeal phase (R13.12) Plan: Continue with current plan of care       GO                [name] 07/23/2017, 10:16 AM  [name], M.A. CCC-SLP [phone]

## 2017-07-24 ENCOUNTER — Inpatient Hospital Stay (HOSPITAL_COMMUNITY): Payer: Medicare Other

## 2017-07-24 LAB — GLUCOSE, CAPILLARY
GLUCOSE-CAPILLARY: 189 mg/dL — AB (ref 65–99)
GLUCOSE-CAPILLARY: 192 mg/dL — AB (ref 65–99)
GLUCOSE-CAPILLARY: 151 mg/dL — AB (ref 65–99)
Glucose-Capillary: 222 mg/dL — ABNORMAL HIGH (ref 65–99)
Glucose-Capillary: 146 mg/dL — ABNORMAL HIGH (ref 65–99)

## 2017-07-24 LAB — BASIC METABOLIC PANEL
Anion gap: 11 (ref 5–15)
BUN: 57 mg/dL — ABNORMAL HIGH (ref 6–20)
CO2: 21 mmol/L — ABNORMAL LOW (ref 22–32)
CREATININE: 2.54 mg/dL — AB (ref 0.61–1.24)
Calcium: 8.6 mg/dL — ABNORMAL LOW (ref 8.9–10.3)
Chloride: 115 mmol/L — ABNORMAL HIGH (ref 101–111)
GFR calc non Af Amer: 25 mL/min — ABNORMAL LOW (ref >60)
GFR, EST AFRICAN AMERICAN: 29 mL/min — AB (ref >60)
Glucose, Bld: 199 mg/dL — ABNORMAL HIGH (ref 65–99)
Potassium: 4.1 mmol/L (ref 3.5–5.1)
SODIUM: 147 mmol/L — AB (ref 135–145)

## 2017-07-24 LAB — EOSINOPHIL COUNT: EOS ABS: 0 10*3/uL (ref 0.0–0.7)

## 2017-07-24 MED ORDER — INSULIN ASPART 100 UNIT/ML ~~LOC~~ SOLN
3.0000 [IU] | SUBCUTANEOUS | Status: DC
  Administered 2017-07-24 – 2017-07-29 (×30): 3 [IU] via SUBCUTANEOUS

## 2017-07-24 MED ORDER — METHYLPREDNISOLONE SODIUM SUCC 125 MG IJ SOLR
60.0000 mg | Freq: Two times a day (BID) | INTRAMUSCULAR | Status: DC
  Administered 2017-07-25: 15:00:00 via INTRAVENOUS
  Administered 2017-07-25 – 2017-07-28 (×6): 60 mg via INTRAVENOUS
  Filled 2017-07-24 (×7): qty 2

## 2017-07-24 NOTE — Progress Notes (Signed)
Pt put on 4L [place] at 1330. Pt has maintained O2 sat at 100% without any issues.

## 2017-07-24 NOTE — Progress Notes (Addendum)
PCCM Progress Note  Admission date: 07/05/2017 CC: short of breath  HPI: 67 yo male presented with shortness of breath from CHF exacerbation, pleural effusion, aspiration pneumonia.  Tried on BiPAP and HFNC. PMHx: CKD 4, CAD s/p CABG, DM, HTN, HLD, DVT on eliquis.  Subjective: Responded well to NPO and steroids.  Remains on HFNC. Had to increase  high flow [place] to 15L last night. Weaned back to 10 L this am and tolerating well.  Net negative 5 L since admission   Asking to be taken to the store for a soda.  Vital signs: BP (!) 137/55   Pulse 77   Temp 98.8 F (37.1 C) (Oral)   Resp (!) 23   Ht 5\' 8"  (1.727 m)   Wt 176 lb 5.9 oz (80 kg)   SpO2 95%   BMI 26.82 kg/m   Intake/output: I/O last 3 completed shifts: In: 0938 [I.V.:1032; NG/GT:140] Out: 2475 [Urine:2075; Stool:400]  Physical exam:  General - chronically ill appearing male, NAD, awake and pleasantly confused HEENT - NCAT,  mm moist, no JVD, No LAD Cardiac - S1. S2, RRR, No MRG  Chest - resps even non labored on HFNC, few bibasilar crackles, few scattered rhonchi, no audible wheeze  Abd - soft, non tender, ND, BS + Ext - scant LE edema  Skin - no rashes, warm dry and intact Neuro - watching TV,  slightly confused, follows commands   Labs: CBC Recent Labs    07/22/17 0444 07/23/17 0323  WBC 5.6 5.3  HGB 7.6* 7.9*  HCT 25.6* 26.7*  PLT 201 202    Coag's Recent Labs    07/22/17 0444  INR 1.17    BMET Recent Labs    07/22/17 0444 07/23/17 0323  NA 149* 149*  K 4.0 4.6  CL 116* 115*  CO2 24 20*  BUN 50* 48*  CREATININE 2.08* 2.14*  GLUCOSE 137* 199*    Electrolytes Recent Labs    07/22/17 0444 07/23/17 0323  CALCIUM 8.4* 8.7*    Sepsis Markers No results for input(s): PROCALCITON, O2SATVEN in the last 72 hours.  Invalid input(s): LACTICACIDVEN  ABG No results for input(s): PHART, PCO2ART, PO2ART in the last 72 hours.  Liver Enzymes No results for input(s): AST, ALT, ALKPHOS,  BILITOT, ALBUMIN in the last 72 hours.  Cardiac Enzymes No results for input(s): TROPONINI, PROBNP in the last 72 hours.  Glucose Recent Labs    07/23/17 1207 07/23/17 1537 07/23/17 1930 07/23/17 2316 07/24/17 0401 07/24/17 0750  GLUCAP 202* 176* 210* 208* 222* 189*    Imaging Dg Chest Portable 1 View  Result Date: 07/23/2017 CLINICAL DATA:  Respiratory failure EXAM: PORTABLE CHEST 1 VIEW COMPARISON:  07/21/2017 FINDINGS: Cardiomegaly is stable. Nasal intestinal feeding tube is stable throughout its course in the esophagus. Diffuse bilateral airspace disease likely related to edema is stable. Right pleural effusion is stable. No pneumothorax. IMPRESSION: Stable pulmonary edema and right pleural effusion. Electronically Signed   By: Marybelle Killings M.D.   On: 07/23/2017 08:23    Studies: PFT 03/17/16 >> FEV1 2.25 (81%), FEV1% 88, TLC 3.70 (55%), DLCO 46% Echo 07/06/17 >> mild LVH, EF 65 to 70%, grade 2 DD, PAS 46 mmHg CT chest 07/11/17 >> interstitial edema, basilar consolidation, b/l effusions CT chest 6/15 >> chronic small vessel ischemic changes, advanced atherosclerosis of large vessels  Cultures: Blood 6/04 >> negative Sputum 6/05 >> negative Blood 6/05 >> negative Respiratory viral panel 6/07 >> negative  Antibiotics: Vancomycin 6/05 >>  6/09 Zosyn 6/05 >> 6/09 Tressie Ellis 6/15 >>6/19   Discussion: 67 yo male with acute hypoxic respiratory failure r/t recurrent aspiration and pulmonary edema. Diuresing. NPO.     Assessment/plan:  Acute hypoxic respiratory failure r/t aspiration, pulmonary edema, HCAP ??eosinophilic PNA ?NSIP - steroids added 6/19 PLAN -  Continue HFNC  NPO  DNI  Bipap PRN - has not needed  S/p  abx tx x 5 days Continue IV steroids -- seems to have had improvement with this + diuresis  Diuresis as renal function allows Mobilize  Pulmonary hygiene   HCAP. T Max 100.8 last 24 WBC 5.3 PLAN -  Monitoring off ABX after 5 day tx with  Ceftazidine Consider PCT if continued fever/ change in CXR f/u CXR 6/22   Dysphagia. ??aspiration of TF  PLAN -  NPO Continued Speech f/u   HFpEF, HLD. Worsening HF per CXR 6/18 PLAN -  Stop Lasix gtt- Scr elevated to 2.5   last 24 hours - now net NEG 5.5 L - CXR now to eval failure norvasc, lipitor, coreg, catapres, cardura, hydralazine, minoxidil Trend CXR 6/22 Trend output Trend BNP  Hypernatremia. Hypokalemia>> resolved CKD 4. Creatinine up trending on Lasix gtt Nursing holding free water x 2 days  PLAN -  Continue D5W Hold Free water for now as he is trending down without it Trend chem  Hold lasix x 24 hours and watch renal function   Anemia of critical illness and chronic disease. Episode of Hemoptysis 6/18 early am PLAN -  SQ heparin  F/u CBC  Monitor for bleeding   DM type II. Hypothyroidism. CBG's > 150>. Most likely steroid related PLAN -  Lantus, SSI  Continue synthroid  Will add Tf coverage  Altered mental status. - improving slowly.  PLAN -  Supportive care  Mobilize    DVT prophylaxis - SQ heparin SUP - not indicated Nutrition - NPO except meds Goals of care - DNR, DNI - bipap only   No family at bedside 6/21.    Magdalen Spatz, AGACNP-BC 07/24/2017  9:23 AM Pager:  [phone]

## 2017-07-24 NOTE — Progress Notes (Signed)
Inpatient Diabetes Program Recommendations  AACE/ADA: New Consensus Statement on Inpatient Glycemic Control (2015)  Target Ranges:  Prepandial:   less than 140 mg/dL      Peak postprandial:   less than 180 mg/dL (1-2 hours)      Critically ill patients:  140 - 180 mg/dL   Results for LYNDEL, SARATE (MRN [id number]) as of 07/24/2017 09:31  Ref. Range 07/23/2017 07:39 07/23/2017 12:07 07/23/2017 15:37 07/23/2017 19:30 07/23/2017 23:16 07/24/2017 04:01 07/24/2017 07:50  Glucose-Capillary Latest Ref Range: 65 - 99 mg/dL 180 (H) 202 (H) 176 (H) 210 (H) 208 (H) 222 (H) 189 (H)    Diabetes history:Type 2 DM Outpatient Diabetes medications:Novolog 15 units at lunch and supper Current orders for Inpatient glycemic control:Novolog 0-20 units Q4H, Lantus 20 units QD  IV Solumedrol 80 mg Q12 hours  Inpatient Diabetes Program Recommendations:    Recommend adding Novolog 3 units Q4H for tube feed coverage.   Thanks, Tama Headings RN, MSN, BC-ADM, [facility] Inpatient Diabetes Coordinator Team Pager [phone] (8a-5p)

## 2017-07-25 ENCOUNTER — Inpatient Hospital Stay (HOSPITAL_COMMUNITY): Payer: Medicare Other

## 2017-07-25 DIAGNOSIS — L8940 Pressure ulcer of contiguous site of back, buttock and hip, unspecified stage: Secondary | ICD-10-CM

## 2017-07-25 DIAGNOSIS — I2721 Secondary pulmonary arterial hypertension: Secondary | ICD-10-CM

## 2017-07-25 DIAGNOSIS — J189 Pneumonia, unspecified organism: Secondary | ICD-10-CM

## 2017-07-25 HISTORY — DX: Secondary pulmonary arterial hypertension: I27.21

## 2017-07-25 LAB — GLUCOSE, CAPILLARY
GLUCOSE-CAPILLARY: 193 mg/dL — AB (ref 65–99)
GLUCOSE-CAPILLARY: 200 mg/dL — AB (ref 65–99)
GLUCOSE-CAPILLARY: 222 mg/dL — AB (ref 65–99)
GLUCOSE-CAPILLARY: 276 mg/dL — AB (ref 65–99)
Glucose-Capillary: 174 mg/dL — ABNORMAL HIGH (ref 65–99)
Glucose-Capillary: 279 mg/dL — ABNORMAL HIGH (ref 65–99)

## 2017-07-25 LAB — CBC
HCT: 25.5 % — ABNORMAL LOW (ref 39.0–52.0)
HEMOGLOBIN: 7.6 g/dL — AB (ref 13.0–17.0)
MCH: 27 pg (ref 26.0–34.0)
MCHC: 29.8 g/dL — AB (ref 30.0–36.0)
MCV: 90.4 fL (ref 78.0–100.0)
Platelets: 198 10*3/uL (ref 150–400)
RBC: 2.82 MIL/uL — ABNORMAL LOW (ref 4.22–5.81)
RDW: 17.6 % — ABNORMAL HIGH (ref 11.5–15.5)
WBC: 7.4 10*3/uL (ref 4.0–10.5)

## 2017-07-25 LAB — BASIC METABOLIC PANEL
Anion gap: 11 (ref 5–15)
BUN: 64 mg/dL — ABNORMAL HIGH (ref 6–20)
CHLORIDE: 118 mmol/L — AB (ref 101–111)
CO2: 20 mmol/L — AB (ref 22–32)
CREATININE: 2.59 mg/dL — AB (ref 0.61–1.24)
Calcium: 8.8 mg/dL — ABNORMAL LOW (ref 8.9–10.3)
GFR calc Af Amer: 28 mL/min — ABNORMAL LOW (ref >60)
GFR calc non Af Amer: 24 mL/min — ABNORMAL LOW (ref >60)
Glucose, Bld: 227 mg/dL — ABNORMAL HIGH (ref 65–99)
Potassium: 4.6 mmol/L (ref 3.5–5.1)
SODIUM: 149 mmol/L — AB (ref 135–145)

## 2017-07-25 LAB — VITAMIN B12: Vitamin B-12: 871 pg/mL (ref 180–914)

## 2017-07-25 LAB — IRON AND TIBC
IRON: 53 ug/dL (ref 45–182)
Saturation Ratios: 19 % (ref 17.9–39.5)
TIBC: 279 ug/dL (ref 250–450)
UIBC: 226 ug/dL

## 2017-07-25 LAB — RETICULOCYTES
RBC.: 2.91 MIL/uL — AB (ref 4.22–5.81)
RETIC COUNT ABSOLUTE: 69.8 10*3/uL (ref 19.0–186.0)
Retic Ct Pct: 2.4 % (ref 0.4–3.1)

## 2017-07-25 LAB — FERRITIN: Ferritin: 486 ng/mL — ABNORMAL HIGH (ref 24–336)

## 2017-07-25 LAB — MAGNESIUM: MAGNESIUM: 2.5 mg/dL — AB (ref 1.7–2.4)

## 2017-07-25 LAB — FOLATE: FOLATE: 6.7 ng/mL (ref >5.9)

## 2017-07-25 MED ORDER — HEPARIN SODIUM (PORCINE) 5000 UNIT/ML IJ SOLN
5000.0000 [IU] | Freq: Three times a day (TID) | INTRAMUSCULAR | Status: DC
  Administered 2017-07-25 – 2017-08-07 (×40): 5000 [IU] via SUBCUTANEOUS
  Filled 2017-07-25 (×40): qty 1

## 2017-07-25 NOTE — Progress Notes (Signed)
BS clear.  Pt refused flutter

## 2017-07-25 NOTE — Progress Notes (Signed)
Family Medicine Teaching Service Daily Progress Note Intern Pager: [phone]  [name] Medical record number: [id number] Date of birth: [birth year] Age: 67 y.o. Gender: male  Primary Care Provider: [name], MD Consultants: CCM Code Status: DNR/DNI, BiPAP or NIPPV only  Pt Overview and Major Events to Date:  07/05/17 admitted for acute respiratory failure  07/07/17 transferred to ICU for resp distress 07/25/17 out to FPTS  Assessment and Plan: Leonard [name] is a 67 y.o. male who presented with SOB and developed acute respiratory failure. PMH is significant for COPD, CKD stage 4, CAD s/p CABG, T2DM, HTN, HLD.  Acute hypoxic respiratory failure with hypoxemia.  Improving.  Uncertain etiology.  Most likely a combination of CHF exacerbation as well as a steroid responsive pneumonitis.  Per CCM unknown specific pathology.  Absolute eosinophil count 0.0.  Does have a large right pleural effusion that may be contributing.  Patient is afebrile and no leukocytosis. Euvolemic today. TTE done 07/06/17 and 07/19/17 showing HFpEF 65-70%, mild LVH, PA peak pressure 63 mmHg, G2DD. A bit somnolent this morning, will recheck on him and decide if needs ABG this afternoon to check for hypercapnia.  -Per CCM, continue Solu-Medrol and repeat 2-view CXRon 6/24, if still present then order therapeutic and diagnostic thoracentesis -Continue Solu-Medrol 60 mg q12 hours (6/22-) -s/p ceftaz (6/15-6/19) for HCAP, now monitoring off ABX -BiPAP prn, has not needed -diuresis as renal function allows, lasix gtt stopped 6/21, given respiratory status improving will hold off restarting -monitor fluid status, strict I/Os, daily wts -monitor on tele   HTN. BP 150/55 this morning, stable. - monitor vitals - continue amlodipine 10mg  qd, Coreg 25mg  BID, clonidine 0.2 mg patch, doxazosin 8mg  qhs, hydralazine 100mg  q8h, isosorbide dinitrate 20mg  TID, minoxidil 5mg  QD - prn IV hydralazine for SBP>160  or DBP>100 - prn labetalol for SBP>130  Hypernatremia. Na 151>149>149>147>149> today pending. Was previously on free water per tube but stopped given improvement by CCM. - continue D5W20cc/hr - monitor BMP  Hypokalemia, resolved.  - continue KCl 7mQq BID  - monitor BMP  CKD IV. Cr 2.59 today from 2.1 yesterday. Baseline ~2.5. Follows with Dr. Marval Regal and has been undergoing preparations to start HD. - monitor kidney fucntion  Anemia of chronic disease. Hgb today pending, stable through admission has been 7.9-7.2 - monitor CBC - anemia panel  T2DM. a1c 6.9. CBGs 146-222 - rSSI - Lantus 20U qd - monitor CBGs  HLD. - continue atorvastatin  Hypothyroidism. TSH 3.338 - continue levothyroxine  Gout - continue allopurinol  FEN/GI: NPO with Tf coverage PPx: heparin SQ  Disposition: pending medical management  Subjective:  Very sleepy this morning and difficult to arouse. Woke up once briefly to answer his name and then went back to sleep  Objective: Temp:  [98 F (36.7 C)-99.5 F (37.5 C)] 98.7 F (37.1 C) (06/23 0819) Pulse Rate:  [79-87] 80 (06/23 0527) Resp:  [18-26] 18 (06/23 0527) BP: (128-173)/(51-67) 158/66 (06/23 0527) SpO2:  [91 %-99 %] 96 % (06/23 0527) Weight:  [79.9 kg (176 lb 2.4 oz)] 79.9 kg (176 lb 2.4 oz) (06/23 0603) Physical Exam: General: Ill-appearing male sleeping, in no acute distress, very sleepy but arousable Cardiovascular: Regular rate and rhythm, normal S1-S2, no murmurs, slight JVD Respiratory: Normal effort on 2 L via nasal cannula, few scattered rhonchi, diminished breath sounds on right lower base with egophony, no wheezes.  Abdomen: Soft, nondistended, nontender, positive bowel sounds Extremities: no LE edema, L AKA Neuro: sleepy  and difficult to arouse. Oriented to self. No focal deficits.  Laboratory: Recent Labs  Lab 07/22/17 0444 07/23/17 0323 07/25/17 0420  WBC 5.6 5.3 7.4  HGB 7.6* 7.9* 7.6*  HCT 25.6* 26.7* 25.5*   PLT 201 202 198   Recent Labs  Lab 07/23/17 0323 07/24/17 0811 07/25/17 0420  NA 149* 147* 149*  K 4.6 4.1 4.6  CL 115* 115* 118*  CO2 20* 21* 20*  BUN 48* 57* 64*  CREATININE 2.14* 2.54* 2.59*  CALCIUM 8.7* 8.6* 8.8*  GLUCOSE 199* 199* 227*   Mag 07/25/17: 2.5  Eosinophil count absolute 07/24/17: 0.0   Imaging/Diagnostic Tests: Dg Chest Port 1 View  Result Date: 07/25/2017 CLINICAL DATA:  CHF. EXAM: PORTABLE CHEST 1 VIEW COMPARISON:  07/24/2017 FINDINGS: A feeding tube courses into the left upper abdomen with tip not imaged. 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 The cardiac silhouette remains enlarged. Pulmonary vascular congestion and interstitial edema are similar to the prior study. A large right pleural effusion also has not significantly changed, and there is associated right lung atelectasis. No pneumothorax is identified. IMPRESSION: Unchanged pulmonary edema and large right pleural effusion. Electronically Signed   By: Logan Bores M.D.   On: 07/25/2017 08:50   Dg Chest Port 1 View  Result Date: 07/24/2017 CLINICAL DATA:  Respiratory failure, CHF, hypertension EXAM: PORTABLE CHEST 1 VIEW COMPARISON:  Portable exam 1322 hours compared to 07/23/2017 FINDINGS: Feeding tube traverses chest into stomach. Enlargement of cardiac silhouette post CABG. Pulmonary vascular congestion. Persistent pulmonary edema. Persistent large RIGHT pleural effusion with significant atelectasis of the RIGHT lung. No pneumothorax. Bones unremarkable. Atherosclerotic calcifications aorta and RIGHT axillary artery. IMPRESSION: Persistent CHF with pulmonary edema, large RIGHT pleural effusion and significant RIGHT lung atelectasis. Electronically Signed   By: Lavonia Dana M.D.   On: 07/24/2017 13:43   Dg Chest Portable 1 View  Result Date: 07/23/2017 CLINICAL DATA:  Respiratory failure EXAM: PORTABLE CHEST 1 VIEW COMPARISON:  07/21/2017 FINDINGS: Cardiomegaly is stable. Nasal intestinal feeding tube  is stable throughout its course in the esophagus. Diffuse bilateral airspace disease likely related to edema is stable. Right pleural effusion is stable. No pneumothorax. IMPRESSION: Stable pulmonary edema and right pleural effusion. Electronically Signed   By: Marybelle Killings M.D.   On: 07/23/2017 08:23    Bufford Lope, DO 07/26/2017, 8:23 AM PGY-2, [place] Intern pager: [phone], text pages welcome

## 2017-07-25 NOTE — Progress Notes (Signed)
Family Medicine Teaching Service Daily Progress Note Intern Pager: [phone]  [name] Medical record number: [id number] Date of birth: 1950-04-23 Age: 67 y.o. Gender: male  Primary Care Provider: [name], MD Consultants: CCM Code Status: DNR/DNI, BiPAP or NIPPV only  Pt Overview and Major Events to Date:  07/05/17 admitted for acute respiratory failure  07/07/17 transferred to ICU for resp distress 07/25/17 out to FPTS  Assessment and Plan: Leonard [name] is a 67 y.o. male who presented with SOB and developed acute respiratory failure. PMH is significant for COPD, CKD stage 4, CAD s/p CABG, T2DM, HTN, HLD.  Acute hypoxic respiratory failure with hypoxemia.  Improving.  Uncertain etiology.  Most likely a combination of CHF exacerbation as well as a steroid responsive pneumonitis.  Per CCM unknown specific pathology.  Absolute eosinophil count 0.0.  Does have a large right pleural effusion that may be contributing.  Patient is afebrile and WBC 7.4.  No increased work of breathing on 2 L via nasal cannula. Weight 178#, was 175 on admission, was 163 in March but unknown dry weight.  Overall appears euvolemic but with some slight JVD.  TTE done 07/06/17 and 07/19/17 showing HFpEF 65-70%, mild LVH, PA peak pressure 63 mmHg, G2DD -Per CCM, continue Solu-Medrol and repeat 2-view CXRon 6/24, if still present then order therapeutic and diagnostic thoracentesis -Continue Solu-Medrol 60 mg q12 hours (6/22-) -s/p ceftaz (6/15-6/19) for HCAP, now monitoring off ABX -BiPAP prn, has not needed -diuresis as renal function allows, lasix gtt stopped 6/21, given respiratory status improving will hold off restarting -monitor fluid status, strict I/Os, daily wts -monitor on tele  HTN. BP 150/55 this morning, stable. - monitor vitals - continue amlodipine 10mg  qd, Coreg 25mg  BID, clonidine 0.2 mg patch, doxazosin 8mg  qhs, hydralazine 100mg  q8h, isosorbide dinitrate 20mg  TID,  minoxidil 5mg  QD - prn IV hydralazine for SBP>160 or DBP>100 - prn labetalol for SBP>130  Hypernatremia. Na 151>149>149>147>149 today. Was previously on free water per tube but stopped given improvement by CCM. - continue D5W20cc/hr - monitor BMP  Hypokalemia, resolved. K 4.6 today - continue KCl 11mQq BID  - monitor BMP  CKD IV. Cr 2.59 today from 2.1 yesterday. Baseline ~2.5. Follows with Dr. Marval Regal and has been undergoing preparations to start HD. - monitor kidney fucntion  Anemia of chronic disease. Hgb 7.6 today, stable through admission has been 7.9-7.2 - monitor CBC - anemia panel  T2DM. a1c 6.9. CBGs 146-222 - rSSI - Lantus 20U qd - monitor CBGs  HLD. - continue atorvastatin  Hypothyroidism. TSH 3.338 - continue levothyroxine  Gout - continue allopurinol  FEN/GI: NPO with Tf coverage PPx: heparin SQ  Disposition: pending medical management  Subjective:   States that his breathing is may be a little bit better today but overall still short of breath.  Is tired.  Not able to talk much.  Objective: Temp:  [97.8 F (36.6 C)-98.9 F (37.2 C)] 98.8 F (37.1 C) (06/22 0758) Pulse Rate:  [67-82] 81 (06/22 0655) Resp:  [16-27] 24 (06/22 0655) BP: (98-162)/(36-68) 150/55 (06/22 0625) SpO2:  [93 %-100 %] 96 % (06/22 0655) FiO2 (%):  [30 %-100 %] 100 % (06/21 1425) Weight:  [178 lb 2.1 oz (80.8 kg)] 178 lb 2.1 oz (80.8 kg) (06/22 0631) Physical Exam: General: Ill-appearing male lying in bed, in no acute distress Cardiovascular: Regular rate and rhythm, normal S1-S2, no murmurs, slight JVD Respiratory: Normal effort on 2 L via nasal cannula, few scattered rhonchi,  diminished breath sounds on right lower base with egophony, no wheezes.  Abdomen: Soft, nondistended, nontender, positive bowel sounds Extremities: no LE edema, L AKA Neuro: sleepy but easily arousable. Oriented x3 but needed much prompting, follows commands.  Laboratory: Recent Labs  Lab  07/22/17 0444 07/23/17 0323 07/25/17 0420  WBC 5.6 5.3 7.4  HGB 7.6* 7.9* 7.6*  HCT 25.6* 26.7* 25.5*  PLT 201 202 198   Recent Labs  Lab 07/19/17 0326  07/23/17 0323 07/24/17 0811 07/25/17 0420  NA 152*   < > 149* 147* 149*  K 3.3*   < > 4.6 4.1 4.6  CL 117*   < > 115* 115* 118*  CO2 24   < > 20* 21* 20*  BUN 81*   < > 48* 57* 64*  CREATININE 2.03*   < > 2.14* 2.54* 2.59*  CALCIUM 8.2*   < > 8.7* 8.6* 8.8*  PROT 5.5*  --   --   --   --   BILITOT 0.7  --   --   --   --   ALKPHOS 70  --   --   --   --   ALT 11*  --   --   --   --   AST 15  --   --   --   --   GLUCOSE 180*   < > 199* 199* 227*   < > = values in this interval not displayed.   Mag 07/25/17: 2.5  Eosinophil count absolute 07/24/17: 0.0   Imaging/Diagnostic Tests: Dg Chest Port 1 View  Result Date: 07/25/2017 CLINICAL DATA:  CHF. EXAM: PORTABLE CHEST 1 VIEW COMPARISON:  07/24/2017 FINDINGS: A feeding tube courses into the left upper abdomen with tip not imaged. 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 The cardiac silhouette remains enlarged. Pulmonary vascular congestion and interstitial edema are similar to the prior study. A large right pleural effusion also has not significantly changed, and there is associated right lung atelectasis. No pneumothorax is identified. IMPRESSION: Unchanged pulmonary edema and large right pleural effusion. Electronically Signed   By: Logan Bores M.D.   On: 07/25/2017 08:50   Dg Chest Port 1 View  Result Date: 07/24/2017 CLINICAL DATA:  Respiratory failure, CHF, hypertension EXAM: PORTABLE CHEST 1 VIEW COMPARISON:  Portable exam 1322 hours compared to 07/23/2017 FINDINGS: Feeding tube traverses chest into stomach. Enlargement of cardiac silhouette post CABG. Pulmonary vascular congestion. Persistent pulmonary edema. Persistent large RIGHT pleural effusion with significant atelectasis of the RIGHT lung. No pneumothorax. Bones unremarkable. Atherosclerotic calcifications aorta and  RIGHT axillary artery. IMPRESSION: Persistent CHF with pulmonary edema, large RIGHT pleural effusion and significant RIGHT lung atelectasis. Electronically Signed   By: Lavonia Dana M.D.   On: 07/24/2017 13:43    Bufford Lope, DO 07/25/2017, 9:02 AM PGY-2, [facility] Intern pager: [phone], text pages welcome

## 2017-07-26 DIAGNOSIS — J189 Pneumonia, unspecified organism: Secondary | ICD-10-CM | POA: Diagnosis present

## 2017-07-26 DIAGNOSIS — J984 Other disorders of lung: Secondary | ICD-10-CM

## 2017-07-26 HISTORY — DX: Pneumonia, unspecified organism: J18.9

## 2017-07-26 HISTORY — DX: Other disorders of lung: J98.4

## 2017-07-26 LAB — BASIC METABOLIC PANEL
Anion gap: 10 (ref 5–15)
BUN: 64 mg/dL — AB (ref 6–20)
CHLORIDE: 120 mmol/L — AB (ref 101–111)
CO2: 21 mmol/L — AB (ref 22–32)
Calcium: 8.8 mg/dL — ABNORMAL LOW (ref 8.9–10.3)
Creatinine, Ser: 2.35 mg/dL — ABNORMAL HIGH (ref 0.61–1.24)
GFR calc Af Amer: 32 mL/min — ABNORMAL LOW (ref >60)
GFR calc non Af Amer: 27 mL/min — ABNORMAL LOW (ref >60)
Glucose, Bld: 250 mg/dL — ABNORMAL HIGH (ref 65–99)
POTASSIUM: 5.4 mmol/L — AB (ref 3.5–5.1)
Sodium: 151 mmol/L — ABNORMAL HIGH (ref 135–145)

## 2017-07-26 LAB — CBC
HEMATOCRIT: 28 % — AB (ref 39.0–52.0)
Hemoglobin: 8.3 g/dL — ABNORMAL LOW (ref 13.0–17.0)
MCH: 26.9 pg (ref 26.0–34.0)
MCHC: 29.6 g/dL — AB (ref 30.0–36.0)
MCV: 90.9 fL (ref 78.0–100.0)
PLATELETS: 230 10*3/uL (ref 150–400)
RBC: 3.08 MIL/uL — ABNORMAL LOW (ref 4.22–5.81)
RDW: 17.8 % — AB (ref 11.5–15.5)
WBC: 7.9 10*3/uL (ref 4.0–10.5)

## 2017-07-26 LAB — GLUCOSE, CAPILLARY
GLUCOSE-CAPILLARY: 229 mg/dL — AB (ref 65–99)
GLUCOSE-CAPILLARY: 325 mg/dL — AB (ref 65–99)
GLUCOSE-CAPILLARY: 287 mg/dL — AB (ref 65–99)
GLUCOSE-CAPILLARY: 302 mg/dL — AB (ref 65–99)
Glucose-Capillary: 268 mg/dL — ABNORMAL HIGH (ref 65–99)
Glucose-Capillary: 270 mg/dL — ABNORMAL HIGH (ref 65–99)

## 2017-07-26 MED ORDER — SODIUM CHLORIDE 0.9 % IV SOLN
INTRAVENOUS | Status: DC
  Administered 2017-07-26: 16:00:00 via INTRAVENOUS

## 2017-07-26 MED ORDER — FREE WATER
200.0000 mL | Freq: Three times a day (TID) | Status: DC
  Administered 2017-07-26 (×2): 200 mL

## 2017-07-26 MED ORDER — SODIUM CHLORIDE 0.45 % IV SOLN: INTRAVENOUS | Status: DC

## 2017-07-27 ENCOUNTER — Inpatient Hospital Stay (HOSPITAL_COMMUNITY): Payer: Medicare Other

## 2017-07-27 DIAGNOSIS — R1310 Dysphagia, unspecified: Secondary | ICD-10-CM

## 2017-07-27 DIAGNOSIS — J189 Pneumonia, unspecified organism: Secondary | ICD-10-CM

## 2017-07-27 DIAGNOSIS — R0902 Hypoxemia: Secondary | ICD-10-CM

## 2017-07-27 DIAGNOSIS — J90 Pleural effusion, not elsewhere classified: Secondary | ICD-10-CM

## 2017-07-27 LAB — BASIC METABOLIC PANEL
Anion gap: 7 (ref 5–15)
BUN: 80 mg/dL — ABNORMAL HIGH (ref 6–20)
CALCIUM: 8.5 mg/dL — AB (ref 8.9–10.3)
CO2: 23 mmol/L (ref 22–32)
Chloride: 120 mmol/L — ABNORMAL HIGH (ref 101–111)
Creatinine, Ser: 2.56 mg/dL — ABNORMAL HIGH (ref 0.61–1.24)
GFR, EST AFRICAN AMERICAN: 28 mL/min — AB (ref >60)
GFR, EST NON AFRICAN AMERICAN: 25 mL/min — AB (ref >60)
Glucose, Bld: 208 mg/dL — ABNORMAL HIGH (ref 65–99)
Potassium: 5.3 mmol/L — ABNORMAL HIGH (ref 3.5–5.1)
Sodium: 150 mmol/L — ABNORMAL HIGH (ref 135–145)

## 2017-07-27 LAB — GLUCOSE, CAPILLARY
GLUCOSE-CAPILLARY: 286 mg/dL — AB (ref 65–99)
GLUCOSE-CAPILLARY: 170 mg/dL — AB (ref 65–99)
GLUCOSE-CAPILLARY: 182 mg/dL — AB (ref 65–99)
GLUCOSE-CAPILLARY: 198 mg/dL — AB (ref 65–99)
Glucose-Capillary: 194 mg/dL — ABNORMAL HIGH (ref 65–99)
Glucose-Capillary: 198 mg/dL — ABNORMAL HIGH (ref 65–99)

## 2017-07-27 LAB — CBC
HCT: 24.8 % — ABNORMAL LOW (ref 39.0–52.0)
Hemoglobin: 7.4 g/dL — ABNORMAL LOW (ref 13.0–17.0)
MCH: 27.1 pg (ref 26.0–34.0)
MCHC: 29.8 g/dL — ABNORMAL LOW (ref 30.0–36.0)
MCV: 90.8 fL (ref 78.0–100.0)
PLATELETS: 213 10*3/uL (ref 150–400)
RBC: 2.73 MIL/uL — ABNORMAL LOW (ref 4.22–5.81)
RDW: 18 % — AB (ref 11.5–15.5)
WBC: 6.9 10*3/uL (ref 4.0–10.5)

## 2017-07-27 LAB — LACTATE DEHYDROGENASE: LDH: 283 U/L — ABNORMAL HIGH (ref 98–192)

## 2017-07-27 LAB — PROTEIN, TOTAL: Total Protein: 5.2 g/dL — ABNORMAL LOW (ref 6.5–8.1)

## 2017-07-27 MED ORDER — INSULIN GLARGINE 100 UNIT/ML ~~LOC~~ SOLN
28.0000 [IU] | Freq: Every day | SUBCUTANEOUS | Status: DC
  Administered 2017-07-27 – 2017-07-29 (×3): 28 [IU] via SUBCUTANEOUS
  Filled 2017-07-27 (×4): qty 0.28

## 2017-07-27 MED ORDER — FREE WATER
30.0000 mL | Status: DC
  Administered 2017-07-27 – 2017-07-28 (×8): 30 mL

## 2017-07-27 NOTE — Progress Notes (Signed)
  Speech Language Pathology Treatment: Dysphagia  Patient Details Name: Leonard [name] MRN: [id number] DOB: Oct 28, 1950 Today's Date: 07/27/2017 Time: 8315-1761 SLP Time Calculation (min) (ACUTE ONLY): 18 min  Assessment / Plan / Recommendation Clinical Impression  Dysphagia treatment provided for exercises and PO readiness. Pt continues to demonstrate lethargy but is able to answer simple questions and follow some simple commands. Today pt was unable to initiate pharyngeal swallow or exercises without bolus; however once offered, pt consumed large amount of thin liquid impulsively (at least 6 oz) without demonstrating any overt s/s of aspiration. Pt did have MBS on 6/14 and subsequently recommended NPO status but given the length of time it has been, recommend a repeat MBS to again objectively evaluate swallow function to determine readiness for PO diet. Until then continue NPO with meds via alternative means.   HPI HPI: HPI: 67 year old man with a history of diabetes, peripheral vascular disease, hypertension and chronic kidney disease.  He was admitted with orthopnea that progressed quickly to fulminant respiratory failure.  He required mechanical ventilation 6/5 and for a period of time was on high [name], neuromuscular blockade.  He has been aggressively diuresed and blood pressure tightly controlled.  He self extubated and 6/12 and has tolerated. Pulled Cortrak.       SLP Plan  MBS       Recommendations  Diet recommendations: NPO Medication Administration: Via alternative means                Oral Care Recommendations: Oral care QID SLP Visit Diagnosis: Dysphagia, pharyngeal phase (R13.13) Plan: [place], [place], CCC-SLP 07/27/2017, 10:02 AM  [id number]

## 2017-07-27 NOTE — Care Management Important Message (Signed)
Important Message  Patient Details  Name: Leonard [name] MRN: [id number] Date of Birth: 1950/04/23   Medicare Important Message Given:  Yes    Orbie Pyo 07/27/2017, 4:20 PM

## 2017-07-27 NOTE — Progress Notes (Signed)
PCCM Progress Note  Admission date: 07/05/2017 CC: short of breath  HPI: 67 yo male presented with shortness of breath from CHF exacerbation, pleural effusion, aspiration pneumonia.  Tried on BiPAP and HFNC. PMHx: CKD 4, CAD s/p CABG, DM, HTN, HLD, DVT on eliquis.  Subjective: No events overnight, no new complaints  Vital signs: BP (!) 149/59 (BP Location: Right Arm)   Pulse 79   Temp 99 F (37.2 C) (Oral)   Resp 20   Ht 5\' 8"  (1.727 m)   Wt 175 lb 11.3 oz (79.7 kg)   SpO2 99%   BMI 26.72 kg/m   Intake/output: I/O last 3 completed shifts: In: 1748.5 [I.V.:466.8; NG/GT:1281.7] Out: 1200 [Urine:1200]  Physical exam:  General - Chronically ill appearing male, NAD HEENT - [place]/AT, PERRL, EOM-I and MMM Cardiac - RRR, Nl S1/S2 and -M/R/G Chest - Down to , decreased BS on the right base Abd - Soft, NT, ND and +BS Ext - -edema and -tenderness Skin - Intact Neuro - Awake and following commands  Labs: CBC Recent Labs    07/25/17 0420 07/26/17 0757 07/27/17 0524  WBC 7.4 7.9 6.9  HGB 7.6* 8.3* 7.4*  HCT 25.5* 28.0* 24.8*  PLT 198 230 213    Coag's No results for input(s): APTT, INR in the last 72 hours.  BMET Recent Labs    07/25/17 0420 07/26/17 0757 07/27/17 0524  NA 149* 151* 150*  K 4.6 5.4* 5.3*  CL 118* 120* 120*  CO2 20* 21* 23  BUN 64* 64* 80*  CREATININE 2.59* 2.35* 2.56*  GLUCOSE 227* 250* 208*    Electrolytes Recent Labs    07/25/17 0420 07/26/17 0757 07/27/17 0524  CALCIUM 8.8* 8.8* 8.5*  MG 2.5*  --   --     Sepsis Markers No results for input(s): PROCALCITON, O2SATVEN in the last 72 hours.  Invalid input(s): LACTICACIDVEN  ABG No results for input(s): PHART, PCO2ART, PO2ART in the last 72 hours.  Liver Enzymes No results for input(s): AST, ALT, ALKPHOS, BILITOT, ALBUMIN in the last 72 hours.  Cardiac Enzymes No results for input(s): TROPONINI, PROBNP in the last 72 hours.  Glucose Recent Labs    07/26/17 1559  07/26/17 2115 07/26/17 2356 07/27/17 0544 07/27/17 0729 07/27/17 1143  GLUCAP 287* 302* 270* 194* 198* 170*    Imaging Dg Chest 2 View  Result Date: 07/27/2017 CLINICAL DATA:  Pleural effusion. EXAM: CHEST - 2 VIEW COMPARISON:  Chest x-rays dated 07/25/2017 and 03/21/2016 and chest CT dated 07/11/2017 FINDINGS: Feeding tube tip below the diaphragm. Large right pleural effusion is essentially unchanged considering different patient position. Pulmonary edema has significantly improved. Minimal left effusion. Heart size is normal. No acute bone abnormality. IMPRESSION: Pulmonary edema has significantly improved since the prior study with minimal residual edema. No significant change in the large right pleural effusion considering differences in position. Persistent small left effusion. Electronically Signed   By: Lorriane Shire M.D.   On: 07/27/2017 08:30    Studies: PFT 03/17/16 >> FEV1 2.25 (81%), FEV1% 88, TLC 3.70 (55%), DLCO 46% Echo 07/06/17 >> mild LVH, EF 65 to 70%, grade 2 DD, PAS 46 mmHg CT chest 07/11/17 >> interstitial edema, basilar consolidation, b/l effusions CT chest 6/15 >> chronic small vessel ischemic changes, advanced atherosclerosis of large vessels  Cultures: Blood 6/04 >> negative Sputum 6/05 >> negative Blood 6/05 >> negative Respiratory viral panel 6/07 >> negative  Antibiotics: Vancomycin 6/05 >> 6/09 Zosyn 6/05 >> 6/09 Tressie Ellis 6/15 >>6/19  I reviewed CXR myself, improving edema noted, pleural effusion noted  Discussion: 67 yo male with acute hypoxic respiratory failure r/t recurrent aspiration and pulmonary edema. Diuresing. NPO.   NGT in place.  Discussed with PCCM-NP  Assessment/plan:  Hypoxemia: improving  - Titrate O2 for sat of 88-92%  Pleural effusion:   - F/U on cultures  - Continue steroids for now until pleural fluid analysis results  - Will need an ambulatory desat study if able prior to discharge for home O2 dosing  - Ambulate  -  PT  HCAP:  - Monitor off abx  - If fever again will consider recultures  - F/U on pleural fluid cultures  Dysphagia:  - NPO  - NGT in place  - SLP following  - May need to consider PEG placement  PCCM will f/u  Rush Farmer, M.D. [facility]. Pager: [phone]. After hours pager: [phone].

## 2017-07-27 NOTE — Progress Notes (Signed)
Modified Barium Swallow Progress Note  Patient Details  Name: Leonard [name] MRN: [id number] Date of Birth: 07-Oct-1950  Today's Date: 07/27/2017  Modified Barium Swallow completed.  Full report located under Chart Review in the Imaging Section.  Brief recommendations include the following:  Clinical Impression  Pt presenting with a moderate oropharyngeal dysphagia which appears improved in comparison to previous MBS. Oral phase characterized by lingual incoordination especially with cup sips along with piecemeal swallowing and premature spillage, decreased base of tongue retraction resulting in silent penetration to the level of the vocal folds with cup sip of thin liquid; cued cough somewhat effective in clearing residuals. With sips by straw, oral coordination is improved and while pt continues to have premature spillage to the pyriform sinuses (bolus sits in pyriforms for ~1 second prior to swallow initiation), pt demonstrated good airway protection. With puree consistency, pt had delayed and effortful oral transit but good airway protection and trace residuals in the vallecula/ pyriform sinuses. The greatest concern is the pt's impulsivity as he needed verbal/ tactile cues to take small sips/ 1 sip at a time by straw. Recommend cautiously initiating full liquid diet, meds crushed in puree, full supervision- only feed when alert, cue small sips- 1 sip at a time. SLP will continue to follow closely for diet tolerance/ consider advancement as mentation improves.   Swallow Evaluation Recommendations       SLP Diet Recommendations: Thin liquid;Other (Comment)(Full liquid)   Liquid Administration via: Straw   Medication Administration: Crushed with puree   Supervision: Staff to assist with self feeding;Full supervision/cueing for compensatory strategies   Compensations: Minimize environmental distractions;Slow rate;Small sips/bites   Postural Changes: Seated upright at 90 degrees   Oral  Care Recommendations: Oral care BID        Kern Reap, MA, CCC-SLP 07/27/2017,3:42 PM  [phone]

## 2017-07-27 NOTE — Progress Notes (Addendum)
Foley catheter and rectal pouch were removed at 1740. Will continue to monitor pt. Pericare was done prior to foley removal. Removed 10cc of fluids prior to removal from the balloon. Removed 45cc from the rectal tube prior to removing rectal tube. Sacrum was cleaned post removal as well.

## 2017-07-27 NOTE — Progress Notes (Addendum)
Family Medicine Teaching Service Daily Progress Note Intern Pager: [phone]  [name] Medical record number: [id number] Date of birth: 1950-03-05 Age: 67 y.o. Gender: male  Primary Care Provider: [name], MD Consultants: CCM Code Status: DNR/DNI, BiPAP or NIPPV only  Pt Overview and Major Events to Date:  07/05/17 admitted for acute respiratory failure  07/07/17 transferred to ICU for resp distress 07/25/17 out to FPTS  Assessment and Plan: Leonard [name] is a 67 y.o. male who presented with SOB and developed acute respiratory failure. PMH is significant for COPD, CKD stage 4, CAD s/p CABG, T2DM, HTN, HLD.  Acute hypoxic respiratory failure with hypoxemia.  Improving.  Uncertain etiology.  Most likely a combination of CHF exacerbation as well as a steroid responsive pneumonitis.  Per CCM unknown specific pathology.  Does have a large right pleural effusion that may be contributing.  Patient is afebrile and WBC is wnl.  Repeat CXR on 6/24 showed no change in R sided pleural effusion, so dx/tx thoracentesis was ordered per CCM recommendations - repeat CXR 6/25 - follow up on thoracentesis results -Continue Solu-Medrol 60 mg q12 hours (6/22-) -s/p ceftaz (6/15-6/19) for HCAP, now monitoring off ABX -BiPAP prn, has not needed -diuresis as renal function allows, although respiratory status has improved after stopping Lasix gtt on 6/21 -monitor fluid status, strict I/Os, daily wts -monitor on telemetry -f/u MBS -d/c foley and rectal tube  HTN. BP 138/52 this morning, stable. - monitor vitals - continue amlodipine 10mg  qd, Coreg 25mg  BID, clonidine 0.2 mg patch, doxazosin 8mg  qhs, hydralazine 100mg  q8h, isosorbide dinitrate 20mg  TID, minoxidil 5mg  QD - prn IV hydralazine for SBP>160 or DBP>100 - prn labetalol for SBP>130  Hypernatremia. Na 151>149>149>147>149>150 on 6/24. Free water was restarted on 6/23. - continue D5W20cc/hr - continue free water per  tube 30 ml Q4H, may need to increase - monitor BMP  Hypokalemia, resolved. K 5.3 on 6/24 - potassium supplementation stopped on 6/23 - monitor BMP  CKD IV. Cr 2.56 on 6/24, appears to be at baseline ~2.5. Follows with Dr. Marval Regal and has been undergoing preparations to start HD. - monitor kidney function  Anemia of chronic disease. Hgb today pending, stable through admission has been 7.9-7.2, although patient has received blood transfusions during admission when hemoglobin <7.0. - monitor CBC  T2DM. a1c 6.9. CBGs mid 200s-mid 300s over last 24 hours, likely d/t D5W and steroids.  Received 81 units of insulin aspart over last 24 hours. - rSSI - increase Lantus to 28U qd - monitor CBGs  HLD. - continue atorvastatin  Hypothyroidism. TSH 3.338 - continue levothyroxine  Gout - continue allopurinol  FEN/GI: NPO with Tf coverage PPx: heparin SQ  Disposition: pending medical management  Subjective:  Says he is feeling "alright" this morning and answered questions appropriate.  He is not in any pain.  Objective: Temp:  [97.9 F (36.6 C)-99.7 F (37.6 C)] 99 F (37.2 C) (06/24 0359) Pulse Rate:  [74-82] 74 (06/24 0359) Resp:  [14-29] 29 (06/24 0359) BP: (124-151)/(48-68) 138/52 (06/24 0359) SpO2:  [92 %-99 %] 92 % (06/24 0359) Weight:  [175 lb 11.3 oz (79.7 kg)] 175 lb 11.3 oz (79.7 kg) (06/24 0545) Physical Exam: General: Ill-appearing male lying in bed, alert, oriented x 2, answering questions appropriately Cardiovascular: Regular rate and rhythm, normal S1-S2, no murmurs Respiratory: Normal effort on 2 L via nasal cannula, clear breath sounds, diminished breath sounds on right lower base, no crackles or rhonchi Abdomen: Soft, nondistended,  nontender, positive bowel sounds Extremities: no LE edema, L AKA Neuro: alert, oriented to self and place  Laboratory: Recent Labs  Lab 07/25/17 0420 07/26/17 0757 07/27/17 0524  WBC 7.4 7.9 6.9  HGB 7.6* 8.3* 7.4*  HCT  25.5* 28.0* 24.8*  PLT 198 230 213   Recent Labs  Lab 07/24/17 0811 07/25/17 0420 07/26/17 0757  NA 147* 149* 151*  K 4.1 4.6 5.4*  CL 115* 118* 120*  CO2 21* 20* 21*  BUN 57* 64* 64*  CREATININE 2.54* 2.59* 2.35*  CALCIUM 8.6* 8.8* 8.8*  GLUCOSE 199* 227* 250*   Mag 07/25/17: 2.5  Eosinophil count absolute 07/24/17: 0.0   Imaging/Diagnostic Tests: Dg Chest Port 1 View  Result Date: 07/25/2017 CLINICAL DATA:  CHF. EXAM: PORTABLE CHEST 1 VIEW COMPARISON:  07/24/2017 FINDINGS: A feeding tube courses into the left upper abdomen with tip not imaged. 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 The cardiac silhouette remains enlarged. Pulmonary vascular congestion and interstitial edema are similar to the prior study. A large right pleural effusion also has not significantly changed, and there is associated right lung atelectasis. No pneumothorax is identified. IMPRESSION: Unchanged pulmonary edema and large right pleural effusion. Electronically Signed   By: Logan Bores M.D.   On: 07/25/2017 08:50   Dg Chest Port 1 View  Result Date: 07/24/2017 CLINICAL DATA:  Respiratory failure, CHF, hypertension EXAM: PORTABLE CHEST 1 VIEW COMPARISON:  Portable exam 1322 hours compared to 07/23/2017 FINDINGS: Feeding tube traverses chest into stomach. Enlargement of cardiac silhouette post CABG. Pulmonary vascular congestion. Persistent pulmonary edema. Persistent large RIGHT pleural effusion with significant atelectasis of the RIGHT lung. No pneumothorax. Bones unremarkable. Atherosclerotic calcifications aorta and RIGHT axillary artery. IMPRESSION: Persistent CHF with pulmonary edema, large RIGHT pleural effusion and significant RIGHT lung atelectasis. Electronically Signed   By: Lavonia Dana M.D.   On: 07/24/2017 13:43    Chrisandra Wiemers, Alcario Drought, MD 07/27/2017, 6:38 AM PGY-1, [place] Intern pager: [phone], text pages welcome

## 2017-07-28 LAB — GLUCOSE, CAPILLARY
GLUCOSE-CAPILLARY: 173 mg/dL — AB (ref 70–99)
GLUCOSE-CAPILLARY: 162 mg/dL — AB (ref 70–99)
Glucose-Capillary: 164 mg/dL — ABNORMAL HIGH (ref 70–99)
Glucose-Capillary: 178 mg/dL — ABNORMAL HIGH (ref 70–99)
Glucose-Capillary: 183 mg/dL — ABNORMAL HIGH (ref 70–99)
Glucose-Capillary: 208 mg/dL — ABNORMAL HIGH (ref 70–99)

## 2017-07-28 LAB — CBC
HEMATOCRIT: 25.6 % — AB (ref 39.0–52.0)
HEMOGLOBIN: 7.9 g/dL — AB (ref 13.0–17.0)
MCH: 27.3 pg (ref 26.0–34.0)
MCHC: 30.9 g/dL (ref 30.0–36.0)
MCV: 88.6 fL (ref 78.0–100.0)
Platelets: 199 10*3/uL (ref 150–400)
RBC: 2.89 MIL/uL — ABNORMAL LOW (ref 4.22–5.81)
RDW: 18.1 % — AB (ref 11.5–15.5)
WBC: 9.4 10*3/uL (ref 4.0–10.5)

## 2017-07-28 LAB — BASIC METABOLIC PANEL
ANION GAP: 7 (ref 5–15)
BUN: 84 mg/dL — AB (ref 8–23)
CALCIUM: 8.4 mg/dL — AB (ref 8.9–10.3)
CO2: 22 mmol/L (ref 22–32)
Chloride: 118 mmol/L — ABNORMAL HIGH (ref 98–111)
Creatinine, Ser: 2.31 mg/dL — ABNORMAL HIGH (ref 0.61–1.24)
GFR calc Af Amer: 32 mL/min — ABNORMAL LOW (ref >60)
GFR, EST NON AFRICAN AMERICAN: 28 mL/min — AB (ref >60)
Glucose, Bld: 181 mg/dL — ABNORMAL HIGH (ref 70–99)
POTASSIUM: 4.4 mmol/L (ref 3.5–5.1)
SODIUM: 147 mmol/L — AB (ref 135–145)

## 2017-07-28 MED ORDER — TRAZODONE HCL 50 MG PO TABS
25.0000 mg | ORAL_TABLET | Freq: Every day | ORAL | Status: DC
  Administered 2017-07-28: 25 mg via ORAL
  Filled 2017-07-28: qty 1

## 2017-07-28 MED ORDER — FREE WATER
100.0000 mL | Status: DC
  Administered 2017-07-28 – 2017-07-29 (×6): 100 mL

## 2017-07-28 NOTE — Progress Notes (Addendum)
Family Medicine Teaching Service Daily Progress Note Intern Pager: [phone]  [name] Medical record number: [id number] Date of birth: 10/19/1950 Age: 67 y.o. Gender: male  Primary Care Provider: [name], MD Consultants: CCM Code Status: DNR/DNI, BiPAP or NIPPV only  Pt Overview and Major Events to Date:  07/05/17 admitted for acute respiratory failure  07/07/17 transferred to ICU for resp distress 07/25/17 out to FPTS  Assessment and Plan: Leonard [name] is a 67 y.o. male who presented with SOB and developed acute respiratory failure. PMH is significant for COPD, CKD stage 4, CAD s/p CABG, T2DM, HTN, HLD.  Acute hypoxic respiratory failure with hypoxemia.  Improving.  Uncertain etiology.  Most likely a combination of CHF exacerbation as well as a steroid responsive pneumonitis.  Per CCM unknown specific pathology.  Does have a large right pleural effusion that may be contributing.  Patient is afebrile and WBC is wnl.  Repeat CXR on 6/24 showed no change in R sided pleural effusion so are continuing to monitor with CXR. - hold off on thoracentesis, will obtain CXR on 6/26 or 6/27 to monitor progress - stop solumedrol -s/p ceftaz (6/15-6/19) for HCAP, now monitoring off ABX -BiPAP prn, has not needed -diuresis as renal function allows, although respiratory status has improved after stopping Lasix gtt on 6/21 -monitor fluid status, strict I/Os, daily wts -monitor on telemetry  HTN. BP 158/83 this morning, stable. - monitor vitals - continue amlodipine 10mg  qd, Coreg 25mg  BID, clonidine 0.2 mg patch, doxazosin 8mg  qhs, hydralazine 100mg  q8h, isosorbide dinitrate 20mg  TID, minoxidil 5mg  QD - prn IV hydralazine for SBP>160 or DBP>100 - prn labetalol for SBP>130  Hypernatremia. Na 151>149>149>147>149>150>147 on 6/25. Free water was restarted on 6/23. - continue D5W20cc/hr - increase free water per tube to 100 ml Q4H since no reported PO intake in last 24  hours, can titrate down as patient takes in more fluids PO - monitor BMP  Hypokalemia, resolved. K 4.4 on 6/25 - potassium supplementation stopped on 6/23 - monitor BMP  CKD IV. Cr 2.31 on 6/24, appears to be at baseline ~2.5. Follows with Dr. Marval Regal and has been undergoing preparations to start HD. - monitor kidney function  Anemia of chronic disease. Hgb today pending, stable through admission has been 7.9-7.2, although patient has received blood transfusions during admission when hemoglobin <7.0. - monitor CBC  T2DM. a1c 6.9. CBGs mid 200s-mid 300s over last 24 hours, likely d/t D5W and steroids.  Received 81 units of insulin aspart over last 24 hours. - rSSI - increase Lantus to 28U qd - monitor CBGs  HLD. - continue atorvastatin  Hypothyroidism. TSH 3.338 - continue levothyroxine  Gout - continue allopurinol  Drowsiness - cut trazodone dose to 25 mg QHS  FEN/GI: full liquid diet PPx: heparin SQ  Disposition: pending medical management  Subjective:  Says he is feeling "alright" this morning and was oriented x 3.  Objective: Temp:  [97.9 F (36.6 C)-99 F (37.2 C)] 98.9 F (37.2 C) (06/25 0619) Pulse Rate:  [75-79] 79 (06/25 0619) Resp:  [12-21] 20 (06/25 0619) BP: (135-158)/(53-83) 158/83 (06/25 0619) SpO2:  [88 %-99 %] 98 % (06/25 7829) Physical Exam: General: Ill-appearing male lying in bed, alert, oriented x 3 Cardiovascular: Regular rate and rhythm, normal S1-S2, no murmurs Respiratory: Normal effort on 2 L via nasal cannula, clear breath sounds, diminished breath sounds on right lower base, no crackles or rhonchi Abdomen: Soft, nondistended, nontender, positive bowel sounds Extremities: no LE  edema, L AKA Neuro: alert, oriented to self, place, and time  Laboratory: Recent Labs  Lab 07/25/17 0420 07/26/17 0757 07/27/17 0524  WBC 7.4 7.9 6.9  HGB 7.6* 8.3* 7.4*  HCT 25.5* 28.0* 24.8*  PLT 198 230 213   Recent Labs  Lab 07/25/17 0420  07/26/17 0757 07/27/17 0524  NA 149* 151* 150*  K 4.6 5.4* 5.3*  CL 118* 120* 120*  CO2 20* 21* 23  BUN 64* 64* 80*  CREATININE 2.59* 2.35* 2.56*  CALCIUM 8.8* 8.8* 8.5*  PROT  --   --  5.2*  GLUCOSE 227* 250* 208*   Mag 07/25/17: 2.5  Eosinophil count absolute 07/24/17: 0.0   Imaging/Diagnostic Tests: Dg Chest 2 View  Result Date: 07/27/2017 CLINICAL DATA:  Pleural effusion. EXAM: CHEST - 2 VIEW COMPARISON:  Chest x-rays dated 07/25/2017 and 03/21/2016 and chest CT dated 07/11/2017 FINDINGS: Feeding tube tip below the diaphragm. Large right pleural effusion is essentially unchanged considering different patient position. Pulmonary edema has significantly improved. Minimal left effusion. Heart size is normal. No acute bone abnormality. IMPRESSION: Pulmonary edema has significantly improved since the prior study with minimal residual edema. No significant change in the large right pleural effusion considering differences in position. Persistent small left effusion. Electronically Signed   By: Lorriane Shire M.D.   On: 07/27/2017 08:30   Dg Chest Port 1 View  Result Date: 07/25/2017 CLINICAL DATA:  CHF. EXAM: PORTABLE CHEST 1 VIEW COMPARISON:  07/24/2017 FINDINGS: A feeding tube courses into the left upper abdomen with tip not imaged. 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 The cardiac silhouette remains enlarged. Pulmonary vascular congestion and interstitial edema are similar to the prior study. A large right pleural effusion also has not significantly changed, and there is associated right lung atelectasis. No pneumothorax is identified. IMPRESSION: Unchanged pulmonary edema and large right pleural effusion. Electronically Signed   By: Logan Bores M.D.   On: 07/25/2017 08:50   Dg Chest Port 1 View  Result Date: 07/24/2017 CLINICAL DATA:  Respiratory failure, CHF, hypertension EXAM: PORTABLE CHEST 1 VIEW COMPARISON:  Portable exam 1322 hours compared to 07/23/2017 FINDINGS: Feeding  tube traverses chest into stomach. Enlargement of cardiac silhouette post CABG. Pulmonary vascular congestion. Persistent pulmonary edema. Persistent large RIGHT pleural effusion with significant atelectasis of the RIGHT lung. No pneumothorax. Bones unremarkable. Atherosclerotic calcifications aorta and RIGHT axillary artery. IMPRESSION: Persistent CHF with pulmonary edema, large RIGHT pleural effusion and significant RIGHT lung atelectasis. Electronically Signed   By: Lavonia Dana M.D.   On: 07/24/2017 13:43   Dg Swallowing Func-speech Pathology  Result Date: 07/27/2017 Objective Swallowing Evaluation: Type of Study: MBS-Modified Barium Swallow Study  Patient Details Name: MOSES ODOHERTY MRN: [id number] Date of Birth: 24-Apr-1950 Today's Date: 07/27/2017 Time: SLP Start Time (ACUTE ONLY): 1440 -SLP Stop Time (ACUTE ONLY): 1505 SLP Time Calculation (min) (ACUTE ONLY): 25 min Past Medical History: Past Medical History: Diagnosis Date . Cancer ([place])   skin cancer- . CHF (congestive heart failure) ([place])  . Chronic kidney disease   not on dialysis . Coronary artery disease  . Gangrene of toe ([place])   right 5th/notes 01/04/2013  . High cholesterol  . Hypertension  . Iron deficiency anemia 08/03/2015 . 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 PAD (peripheral artery disease) ([place])  . Shortness of breath dyspnea   with exertion . Type II diabetes mellitus ([place])   Type II Past Surgical History: Past Surgical History: Procedure Laterality Date . ABDOMINAL  AORTOGRAM W/LOWER EXTREMITY N/A 03/12/2017  Procedure: ABDOMINAL AORTOGRAM W/LOWER EXTREMITY;  Surgeon: Conrad [place] Isabella, MD;  Location: [facility];  Service: Cardiovascular;  Laterality: N/A; . AMPUTATION Right 01/28/2013  Procedure: RAY AMPUTATION RIGHT  4th & 5th TOE;  Surgeon: Rosetta Posner, MD;  Location: [facility];  Service: Vascular;  Laterality: Right; . AMPUTATION Left 03/15/2017  Procedure: AMPUTATION ABOVE KNEE;  Surgeon: Waynetta Sandy, MD;  Location: [facility];  Service: Vascular;  Laterality: Left; . ANGIOPLASTY / STENTING FEMORAL Right 01/13/2013  superficial femoral artery x 2 (3 mm x 60 mm, 4 mm x 60 mm) . AV FISTULA PLACEMENT Left 02/11/2016  Procedure: LEFT UPPER ARM BRACHIAL CEPHALIC ARTERIOVENOUS (AV) FISTULA CREATION;  Surgeon: Conrad Elgin, MD;  Location: [facility];  Service: Vascular;  Laterality: Left; . COLONOSCOPY W/ POLYPECTOMY   . CORONARY ARTERY BYPASS GRAFT N/A 04/07/2013  Procedure: CORONARY ARTERY BYPASS GRAFTING (CABG);  Surgeon: Ivin Poot, MD;  Location: [facility];  Service: Open Heart Surgery;  Laterality: N/A; . INTRAOPERATIVE TRANSESOPHAGEAL ECHOCARDIOGRAM N/A 04/07/2013  Procedure: INTRAOPERATIVE TRANSESOPHAGEAL ECHOCARDIOGRAM;  Surgeon: Ivin Poot, MD;  Location: [facility];  Service: Open Heart Surgery;  Laterality: N/A; . LEFT HEART CATHETERIZATION WITH CORONARY ANGIOGRAM N/A 04/05/2013  Procedure: LEFT HEART CATHETERIZATION WITH CORONARY ANGIOGRAM;  Surgeon: Peter M Martinique, MD;  Location: [facility];  Service: Cardiovascular;  Laterality: N/A; . LOWER EXTREMITY ANGIOGRAM Bilateral 01/06/2013  Procedure: LOWER EXTREMITY ANGIOGRAM;  Surgeon: Conrad Leetsdale, MD;  Location: [facility] C[facility];  Service: Cardiovascular;  Laterality: Bilateral; . LOWER EXTREMITY ANGIOGRAM Right 01/13/2013  Procedure: LOWER EXTREMITY ANGIOGRAM;  Surgeon: Conrad [place], MD;  Location: [facility] CATH LAB;  Service: Cardiovascular;  Laterality: Right; . SKIN CANCER EXCISION  2017 . THROAT SURGERY  1983  polyps . TONSILLECTOMY AND ADENOIDECTOMY  ~ 1965 HPI: HPI: 67 year old man with a history of diabetes, peripheral vascular disease, hypertension and chronic kidney disease.  He was admitted with orthopnea that progressed quickly to fulminant respiratory failure.  He required mechanical ventilation 6/5 and for a period of time was on high [name], neuromuscular blockade.  He has been aggressively diuresed and blood pressure tightly controlled.  He self extubated and  6/12 and has tolerated. Pulled Cortrak.  No data recorded Assessment / Plan / Recommendation CHL IP CLINICAL IMPRESSIONS 07/27/2017 Clinical Impression Pt presenting with a moderate oropharyngeal dysphagia which appears improved in comparison to previous MBS. Oral phase characterized by lingual incoordination especially with cup sips, along with piecemeal swallowing and premature spillage, decreased base of tongue retraction and silent penetration to the level of the vocal folds with cup sip of thin liquid; cued cough somewhat effective in clearing residuals. With sips by straw, oral coordination is improved and while pt continues to have premature spillage to the pyriform sinuses (bolus sits in pyriforms for ~1 second prior to swallow initiation), pt consistently demonstrated good airway protection and no penetration/ aspiration by straw. With puree consistency, pt had delayed and effortful oral transit but good airway protection and trace residuals in the vallecula/ pyriform sinuses. The greatest concern is the pt's impulsivity as he needed verbal/ tactile cues to take small sips/ 1 sip at a time by straw. Recommend cautiously initiating full liquid diet, meds crushed in puree, full supervision- only feed when alert, cue small sips- 1 sip at a time. SLP will continue to follow closely for diet tolerance/ consider advancement as mentation improves. SLP Visit Diagnosis Dysphagia, oropharyngeal phase (R13.12) Attention  and concentration deficit following -- Frontal lobe and executive function deficit following -- Impact on safety and function Moderate aspiration risk   CHL IP TREATMENT RECOMMENDATION 07/27/2017 Treatment Recommendations Therapy as outlined in treatment plan below   Prognosis 07/27/2017 Prognosis for Safe Diet Advancement Good Barriers to Reach Goals Cognitive deficits;Time post onset Barriers/Prognosis Comment -- CHL IP DIET RECOMMENDATION 07/27/2017 SLP Diet Recommendations Thin liquid;Other (Comment)  Liquid Administration via Straw Medication Administration Crushed with puree Compensations Minimize environmental distractions;Slow rate;Small sips/bites Postural Changes Seated upright at 90 degrees   CHL IP OTHER RECOMMENDATIONS 07/27/2017 Recommended Consults -- Oral Care Recommendations Oral care BID Other Recommendations --   CHL IP FOLLOW UP RECOMMENDATIONS 07/27/2017 Follow up Recommendations Skilled Nursing facility   [facility] IP FREQUENCY AND DURATION 07/27/2017 Speech Therapy Frequency (ACUTE ONLY) min 2x/week Treatment Duration 2 weeks      CHL IP ORAL PHASE 07/27/2017 Oral Phase Impaired Oral - Pudding Teaspoon -- Oral - Pudding Cup -- Oral - Honey Teaspoon -- Oral - Honey Cup -- Oral - Nectar Teaspoon -- Oral - Nectar Cup -- Oral - Nectar Straw -- Oral - Thin Teaspoon -- Oral - Thin Cup Reduced posterior propulsion;Piecemeal swallowing;Delayed oral transit;Premature spillage Oral - Thin Straw Reduced posterior propulsion;Piecemeal swallowing;Delayed oral transit;Premature spillage Oral - Puree Reduced posterior propulsion;Delayed oral transit Oral - Mech Soft -- Oral - Regular -- Oral - Multi-Consistency -- Oral - Pill -- Oral Phase - Comment --  CHL IP PHARYNGEAL PHASE 07/27/2017 Pharyngeal Phase Impaired Pharyngeal- Pudding Teaspoon -- Pharyngeal -- Pharyngeal- Pudding Cup -- Pharyngeal -- Pharyngeal- Honey Teaspoon -- Pharyngeal -- Pharyngeal- Honey Cup -- Pharyngeal -- Pharyngeal- Nectar Teaspoon -- Pharyngeal -- Pharyngeal- Nectar Cup -- Pharyngeal -- Pharyngeal- Nectar Straw -- Pharyngeal -- Pharyngeal- Thin Teaspoon -- Pharyngeal -- Pharyngeal- Thin Cup Delayed swallow initiation-pyriform sinuses;Reduced tongue base retraction;Penetration/Aspiration before swallow Pharyngeal Material enters airway, CONTACTS cords and not ejected out Pharyngeal- Thin Straw Delayed swallow initiation-pyriform sinuses;Reduced tongue base retraction Pharyngeal -- Pharyngeal- Puree Pharyngeal residue - valleculae Pharyngeal --  Pharyngeal- Mechanical Soft -- Pharyngeal -- Pharyngeal- Regular -- Pharyngeal -- Pharyngeal- Multi-consistency -- Pharyngeal -- Pharyngeal- Pill -- Pharyngeal -- Pharyngeal Comment --  CHL IP CERVICAL ESOPHAGEAL PHASE 07/27/2017 Cervical Esophageal Phase WFL Pudding Teaspoon -- Pudding Cup -- Honey Teaspoon -- Honey Cup -- Nectar Teaspoon -- Nectar Cup -- Nectar Straw -- Thin Teaspoon -- Thin Cup -- Thin Straw -- Puree -- Mechanical Soft -- Regular -- Multi-consistency -- Pill -- Cervical Esophageal Comment -- No flowsheet data found. Kern Reap, MA, CCC-SLP 07/27/2017, 3:40 PM [id number]               Kathrene Alu, MD 07/28/2017, 7:25 AM PGY-1, [place] Intern pager: [phone], text pages welcome

## 2017-07-28 NOTE — Progress Notes (Signed)
PT Cancellation Note  Patient Details Name: Leonard [name] MRN: [id number] DOB: 02/05/1950   Cancelled Treatment:    Reason Eval/Treat Not Completed: Patient's level of consciousness;Fatigue/lethargy limiting ability to participate. Chart reviewed, RN consulted. Arrived, assisted RN with bed and gown changes of patient. Pt remains somnolent throughout, intermittently interactive, responding <25% of verbal cues. Pt requires TotalA +2 for rolling in bed. PT attempted bed level exercises, pt not consistently following commands. Caregiver in room reports patient is at his functional baseline, total care PTA, not participating with mobility or daily care. Being that patient is currently total care, he is at his functional baseline level. No additional skilled PT services needed at this time, PT signing off. PT recommends daily mobility in bed ad lib or with nursing staff as needed to prevent deconditioning and maintain daily activity consistent to PTA.   2:21 PM, 07/28/17 Etta Grandchild, PT, DPT Physical Therapist - [place] [phone] (Pager)  [phone] (Office)    Shalona Harbour C 07/28/2017, 2:18 PM

## 2017-07-28 NOTE — Progress Notes (Signed)
  Speech Language Pathology Treatment: Dysphagia  Patient Details Name: Leonard [name] MRN: [id number] DOB: [birth year] Today's Date: 07/28/2017 Time: 5051-8335 SLP Time Calculation (min) (ACUTE ONLY): 12 min  Assessment / Plan / Recommendation Clinical Impression  Dysphagia treatment provided for diet tolerance check. Pt continues to present as lethargic/ somnolent although he did respond to simple questions and commands. Pt continues to show impulsivity with drinking thin liquids and would not follow verbal/ tactile cues to take small sips, but no overt s/s of aspiration noted and subjectively appeared to be swallowing thin liquids and puree consistency timely. Pt does appear slightly SOB after consuming large amounts of thin liquid. Respiratory status improving as pt was off O2 during tx session. RN indicated pt consumed less than half of lunch today and pt does continue to have NGT. Due to lethargy and impulsivity, recommend continuing full liquid diet with meds crushed in puree. Will continue to follow closely for diet tolerance.  HPI HPI: HPI: 67 year old man with a history of diabetes, peripheral vascular disease, hypertension and chronic kidney disease.  He was admitted with orthopnea that progressed quickly to fulminant respiratory failure.  He required mechanical ventilation 6/5 and for a period of time was on high [name], neuromuscular blockade.  He has been aggressively diuresed and blood pressure tightly controlled.  He self extubated and 6/12 and has tolerated. Pulled Cortrak.       SLP Plan  Continue with current plan of care       Recommendations  Diet recommendations: Thin liquid(full liquid) Liquids provided via: Straw Medication Administration: Crushed with puree Supervision: Staff to assist with self feeding;Full supervision/cueing for compensatory strategies Compensations: Minimize environmental distractions;Slow rate;Small sips/bites Postural Changes and/or Swallow  Maneuvers: Seated upright 90 degrees                Oral Care Recommendations: Oral care BID Follow up Recommendations: Skilled Nursing facility SLP Visit Diagnosis: Dysphagia, oropharyngeal phase (R13.12) Plan: Continue with current plan of care       [place], [place], [place] 07/28/2017, 2:29 PM [phone]

## 2017-07-28 NOTE — Progress Notes (Signed)
[facility] PCCM Progress Note    Admission Date:  07/05/17 CC:  Shortness of Breath    Brief Summary:  [name] admitted on 6/2 with dyspnea, cough, orthopnea, and lower extremity swelling.  Work up concerning for decompensated CHF +/- pulmonary infiltrate.  Failed bipap and required intubation.  Prolonged ICU stay due to respiratory failure, aspiration and volume overload.  Empirically treated with IV steroids for possible pneumonitis.   Transferred out of ICU to [place].  CXR improving, eos negative.  Steroid stopped 6/25.       PMH:  CKD 4, CAD s/p CABG, DM, HTN, HLD, MGUS, DVT on Eliquis    S: No events overnight, lethargic   O: Blood pressure (!) 158/83, pulse 79, temperature 98.9 F (37.2 C), temperature source Oral, resp. rate 20, height 5\' 8"  (1.727 m), weight 175 lb 11.3 oz (79.7 kg), SpO2 98 %.  General:  Chronically ill appearing male, lethargic HEENT: MM pink/moist Neuro: Arousable and responds but lethargic CV: s1s2 rrr, no m/r/g PULM: even/non-labored, lungs bilaterally with decreased BS on the right. GU:YQIH, non-tender, bsx4 active  Extremities: warm/dry, 1+edema  Skin: no rashes or lesions  CBC Latest Ref Rng & Units 07/28/2017 07/27/2017 07/26/2017  WBC 4.0 - 10.5 K/uL 9.4 6.9 7.9  Hemoglobin 13.0 - 17.0 g/dL 7.9(L) 7.4(L) 8.3(L)  Hematocrit 39.0 - 52.0 % 25.6(L) 24.8(L) 28.0(L)  Platelets 150 - 400 K/uL 199 213 230    BMP Latest Ref Rng & Units 07/28/2017 07/27/2017 07/26/2017  Glucose 70 - 99 mg/dL 181(H) 208(H) 250(H)  BUN 8 - 23 mg/dL 84(H) 80(H) 64(H)  Creatinine 0.61 - 1.24 mg/dL 2.31(H) 2.56(H) 2.35(H)  BUN/Creat Ratio 10 - 24 - - -  Sodium 135 - 145 mmol/L 147(H) 150(H) 151(H)  Potassium 3.5 - 5.1 mmol/L 4.4 5.3(H) 5.4(H)  Chloride 98 - 111 mmol/L 118(H) 120(H) 120(H)  CO2 22 - 32 mmol/L 22 23 21(L)  Calcium 8.9 - 10.3 mg/dL 8.4(L) 8.5(L) 8.8(L)   Studies: PFT 03/17/16 >> FEV1 2.25 (81%), FEV1% 88, TLC 3.70 (55%), DLCO 46% Echo 07/06/17 >> mild LVH, EF 65  to 70%, grade 2 DD, PAS 46 mmHg CT chest 07/11/17 >> interstitial edema, basilar consolidation, b/l effusions CT chest 6/15 >> chronic small vessel ischemic changes, advanced atherosclerosis of large vessels  Cultures: Blood 6/04 >> negative Sputum 6/05 >> negative Blood 6/05 >> negative Respiratory viral panel 6/07 >> negative  Antibiotics: Vancomycin 6/05 >> 6/09 Zosyn 6/05 >> 6/09 Fortaz 6/15 >> 6/19   Events: 6/02  Admit  6/04  ICU for resp distress  6/19  Steroids added empirically for possible eosinophilic pneumonia / NSIP 6/22  Tx to floor  6/25  O2 needs improved  A: Acute Hypoxic Respiratory Failure - in setting of aspiration/HCAP, pulmonary edema, possible steroid responsive pneumonitis of unclear etiology (steroids added 6/19, eosinophils 0) Right Pleural Effusion  Dysphagia with concern for Aspiration GOLD III COPD  PAH  HCAP  HFpEF   P: Continue SLP efforts, appreciate dietary recommendations Follow pleural effusion size on serial CXR Negative to even fluid balance as renal function / BP permit  Discontinue steroids with negative eosinophil count Wean O2 to off for sats 88-95% Pulmonary hygiene- IS, mobilize out of bed BID Completed abx- follow fever curve / WBC trend  Outpatient pulmonary follow up at discharge for COPD  Leonard Gens, NP-C [place] Pgr: [phone] or if no answer [phone] 07/28/2017, 9:59 AM  Attending Note:  67 year old male  with extensive PMH who presents to PCCM with pulmonary infiltrate, pleural effusion, aspiration pneumonia and hypoxemic respiratory failure.  On exam, diffuse crackles with decreased BS at the bases.  I reviewed CXR myself, pleural effusions are improving and pulmonary edema is slowly improving.  O2 demand is decreasing on exam, down to 2L [place].  Discussed with PCCM-NP.  Pulmonary infiltrate: likely pleural effusion and aspiration.  Very unlikely to be eosinophilic pneumonia or pneumonitis              - Completed course of abx             - Keep dry as able             - Strict I/O             - Stop steroids  Pleural effusion: CRI and CHF related             - Keep dry as renal function allows             - Strict I/O             - Treat CHF  - Likely not useful to thora but will defer to primary  Aspiration pneumonia:             - Aspiration precautions.             - Abx course complete  Hypoxemia:             - Will need an ambulatory desaturation study for dose of home O2             - Titrate O2 for sat of 88-92%.  PCCM will sign off, please call back if needed.  Patient seen and examined, agree with above note.  I dictated the care and orders written for this patient under my direction.  Leonard [name], [place]

## 2017-07-28 NOTE — Progress Notes (Signed)
Nutrition Follow-up  INTERVENTION:   - Continue trickle TF via Cortrak with Vital AF 1.2 @ 20 ml/hr  - As tolerance established, increase to goal rate of 75 ml/hr (1800 ml/day) to provide 2160 kcal, 135 grams protein, and 1460 ml free water daily  - Magic cup TID with meals, each supplement provides 290 kcal and 9 grams of protein  - Recommend feeding assistance with all meals.  NUTRITION DIAGNOSIS:   Inadequate oral intake related to inability to eat as evidenced by NPO status.  Progressing, pt is now on full liquid diet  GOAL:   Patient will meet greater than or equal to 90% of their needs  Unmet at this time  MONITOR:   TF tolerance, PO intake, Weight trends, Skin, I & O's, Labs  REASON FOR ASSESSMENT:   Consult Enteral/tube feeding initiation and management  ASSESSMENT:   67 y.o. M admitted on 07/05/17 for acute respiratory failure with hypoxia and CHF exacerbation. PMH of HTN, PAD, CHF, CAD, hx of skin cancer, T2DM, hx of gangrene of toe 2014, CKD stg IV, and anemia. 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 Pt intubated 6/5.   Pt's weight remains trending down since admission. Suspect related to ongoing negative fluid balance.  Spoke with pt and caregiver at bedside.  Discussed pt with RN. RN reports pt has been tolerating his tube feeding well.  RD noted Vital AF 1.2 infusing via Cortrak @ 20 ml/hr at time of visit. Cortrak was temporarily "clogged" due to pt's position in bed. Once pt was readjusted, TF began infusing appropriately. Also noted untouched lunch meal tray. Caregiver opening packages for pt with plans to feed him.  RD to order Magic Cup with all meal trays to maximize kcal and protein intake as pt's current TF rate is not meeting his nutritional needs.  Medications reviewed and include: Vital AF 1.2, 300 mg ferrous sulfate BID, 100 ml free water flushes q 4 hours, sliding scale Novolog, 3 units Novolog q 4 hours, 28 units daily, 100 mcg levothyroxine  Labs  reviewed: sodium 147 (H), chloride 118 (H), BUN 84 (H), creatinine 2.31 (H), hemoglobin 7.9 (L), HCT 25.6 (L) CBG's: 178, 183, 173, 208, 198, 182 x 24 hours  UOP: 1050 x 24 hours I/O's: -10.6 L since 07/14/17   Diet Order:   Diet Order           Diet full liquid Room service appropriate? Yes; Fluid consistency: Thin  Diet effective now          EDUCATION NEEDS:   Not appropriate for education at this time  Skin:  Skin Assessment: Skin Integrity Issues: Skin Integrity Issues:: Other (Comment), Stage II Stage II: coccyx Other: R foot wound  Last BM:  07/28/17 medium type 7 (rectal tube removed 6/24)  Height:   Ht Readings from Last 1 Encounters:  07/05/17 5\' 8"  (1.727 m)    Weight:   Wt Readings from Last 1 Encounters:  07/27/17 175 lb 11.3 oz (79.7 kg)    Ideal Body Weight:  64.4 kg  BMI:  Body mass index is 26.72 kg/m.  Estimated Nutritional Needs:   Kcal:  2100-2300 kcal  Protein:  120-135 g  Fluid:  2.1 L    Gaynell Face, MS, RD, LDN Pager: [phone] Weekend/After Hours: [phone]

## 2017-07-28 NOTE — Progress Notes (Signed)
CSW continuing to follow for discharge needs.  Percell Locus Rosali Augello LCSW [phone]

## 2017-07-29 ENCOUNTER — Inpatient Hospital Stay (HOSPITAL_COMMUNITY): Payer: Medicare Other

## 2017-07-29 LAB — CBC
HEMATOCRIT: 28.7 % — AB (ref 39.0–52.0)
HEMOGLOBIN: 8.6 g/dL — AB (ref 13.0–17.0)
MCH: 26.7 pg (ref 26.0–34.0)
MCHC: 30 g/dL (ref 30.0–36.0)
MCV: 89.1 fL (ref 78.0–100.0)
Platelets: 174 10*3/uL (ref 150–400)
RBC: 3.22 MIL/uL — ABNORMAL LOW (ref 4.22–5.81)
RDW: 18.1 % — ABNORMAL HIGH (ref 11.5–15.5)
WBC: 6.2 10*3/uL (ref 4.0–10.5)

## 2017-07-29 LAB — BASIC METABOLIC PANEL
ANION GAP: 10 (ref 5–15)
BUN: 84 mg/dL — ABNORMAL HIGH (ref 8–23)
CALCIUM: 8.2 mg/dL — AB (ref 8.9–10.3)
CHLORIDE: 117 mmol/L — AB (ref 98–111)
CO2: 19 mmol/L — ABNORMAL LOW (ref 22–32)
Creatinine, Ser: 2.04 mg/dL — ABNORMAL HIGH (ref 0.61–1.24)
GFR calc Af Amer: 37 mL/min — ABNORMAL LOW (ref >60)
GFR calc non Af Amer: 32 mL/min — ABNORMAL LOW (ref >60)
GLUCOSE: 139 mg/dL — AB (ref 70–99)
POTASSIUM: 4.4 mmol/L (ref 3.5–5.1)
Sodium: 146 mmol/L — ABNORMAL HIGH (ref 135–145)

## 2017-07-29 LAB — GLUCOSE, CAPILLARY
GLUCOSE-CAPILLARY: 131 mg/dL — AB (ref 70–99)
Glucose-Capillary: 112 mg/dL — ABNORMAL HIGH (ref 70–99)
Glucose-Capillary: 114 mg/dL — ABNORMAL HIGH (ref 70–99)
Glucose-Capillary: 128 mg/dL — ABNORMAL HIGH (ref 70–99)
Glucose-Capillary: 133 mg/dL — ABNORMAL HIGH (ref 70–99)
Glucose-Capillary: 143 mg/dL — ABNORMAL HIGH (ref 70–99)
Glucose-Capillary: 166 mg/dL — ABNORMAL HIGH (ref 70–99)

## 2017-07-29 LAB — TROPONIN I: TROPONIN I: 0.04 ng/mL — AB (ref <0.03)

## 2017-07-29 NOTE — Progress Notes (Signed)
At 0430 pt was slouched down in the bed after receiving a bath and he began to complain of chest pain on the left side of his chest at the nipple. After repositioning the patient, patient stated his pain was gone. When asked again he said the pain was still present and he rated his chest pain 3/10 and was falling asleep as he was stating this. BP became elevated with systolic in the 497N, HR 30Y-51T, and pulse ox 97%-100%. RN notified Winfrey,MD. She placed order for a STAT EKG and ordered for a STAT troponin to be drawn. EKG was WNL noted by MD and troponin lab is pending. Patient BP normalized after receiving scheduled 0600 dose. Patient is resting at this time. Will continue to monitor and treat per MD orders.

## 2017-07-29 NOTE — Progress Notes (Signed)
  Speech Language Pathology Treatment: Dysphagia  Patient Details Name: Leonard [name] MRN: [id number] DOB: 1950/03/23 Today's Date: 07/29/2017 Time: 5379-4327 SLP Time Calculation (min) (ACUTE ONLY): 20 min  Assessment / Plan / Recommendation Clinical Impression  Dysphagia treatment provided to check diet tolerance. Continued lethargy/ decreased alertness but does awaken to PO trials, simple questions and commands. Pt consumed thin liquids and puree consistency without overt s/s of aspiration. Continues to appear to have slowed bolus formation/ oral transit of puree material. Continued impulsivity impacting swallow safety- today did well with very simple verbal cue- "stop"- when taking sips by straw to take 1 sip at a time. Aspiration continues to be increased due to cognitive status. Recommend continuing full liquid diet, meds crushed in puree. Will continue to follow for tolerance/ consider advancement.  HPI HPI: HPI: 67 year old man with a history of diabetes, peripheral vascular disease, hypertension and chronic kidney disease.  He was admitted with orthopnea that progressed quickly to fulminant respiratory failure.  He required mechanical ventilation 6/5 and for a period of time was on high [name], neuromuscular blockade.  He has been aggressively diuresed and blood pressure tightly controlled.  He self extubated and 6/12 and has tolerated. Pulled Cortrak.       SLP Plan  Continue with current plan of care       Recommendations  Diet recommendations: Other(comment)(full liquid) Liquids provided via: Straw Medication Administration: Crushed with puree Supervision: Staff to assist with self feeding;Full supervision/cueing for compensatory strategies Compensations: Minimize environmental distractions;Slow rate;Small sips/bites Postural Changes and/or Swallow Maneuvers: Seated upright 90 degrees                Oral Care Recommendations: Oral care BID Follow up Recommendations:  Skilled Nursing facility SLP Visit Diagnosis: Dysphagia, oropharyngeal phase (R13.12) Plan: Continue with current plan of care       [place], [place], [place] 07/29/2017, 11:23 AM  [id number]

## 2017-07-29 NOTE — Progress Notes (Signed)
CRITICAL VALUE ALERT  Critical Value:  Troponin 0.04  Date & Time Notied:  07/29/17 at 0700  Provider Notified: Winfrey,MD  Orders Received/Actions taken: MD returned page and said this has been the patient's baseline. No new orders placed at this time. Will let oncoming dayshift RN know.

## 2017-07-29 NOTE — Progress Notes (Addendum)
Family Medicine Teaching Service Daily Progress Note Intern Pager: [phone]  [name] Medical record number: [id number] Date of birth: 10-01-1950 Age: 66 y.o. Gender: male  Primary Care Provider: [name], MD Consultants: CCM Code Status: DNR/DNI, BiPAP or NIPPV only  Pt Overview and Major Events to Date:  07/05/17 admitted for acute respiratory failure  07/07/17 transferred to ICU for resp distress 07/25/17 out to FPTS  Assessment and Plan: Leonard [name] is a 67 y.o. male who presented with SOB and developed acute respiratory failure. PMH is significant for COPD, CKD stage 4, CAD s/p CABG, T2DM, HTN, HLD.  Acute hypoxic respiratory failure with hypoxemia.  Improving.  Uncertain etiology.  Most likely a combination of CHF exacerbation as well as a steroid responsive pneumonitis.  Per CCM unknown specific pathology.  Does have a large right pleural effusion that may be contributing.  Patient is afebrile and WBC is wnl.  Repeat CXR on 6/26 showed no change in R sided pleural effusion so are continuing to monitor with weekly CXR, next on 7/3. - hold off on thoracentesis, will obtain CXR on 6/26 to monitor progress - stop solumedrol -s/p ceftaz (6/15-6/19) for HCAP, now monitoring off ABX -BiPAP prn, has not needed -diuresis as renal function allows, although respiratory status has improved after stopping Lasix gtt on 6/21 -monitor fluid status, strict I/Os, daily wts -monitor on telemetry -clamp NG tube to decrease risk of aspiration, see if patient can take PO  Chest pain, mild, acute. Patient reported 3/10 chest pain around left nipple early morning of 6/26.  EKG was unchanged from previous and had no ST changes, troponin unchanged from baseline at 0.04.  Patient was sleeping soon after reporting chest pain.   - can repeat EKG and troponin if pain becomes more severe  HTN. BP 158/83 this morning, stable. - monitor vitals - continue amlodipine 10mg  qd,  Coreg 25mg  BID, clonidine 0.2 mg patch, doxazosin 8mg  qhs, hydralazine 100mg  q8h, isosorbide dinitrate 20mg  TID, minoxidil 5mg  QD - prn IV hydralazine for SBP>160 or DBP>100 - prn labetalol for SBP>130  Hypernatremia, resolving. Na [id number] on 6/26. Free water was restarted on 6/23. - continue D5W20cc/hr - clamp NG tube to encourage PO intake of fluids - monitor BMP  Hypokalemia, resolved. K 4.4 on 6/26 - potassium supplementation stopped on 6/23 - monitor BMP  CKD IV. Cr 2.04 on 6/26, appears to be at baseline ~2.5. Follows with Dr. Marval Regal and has been undergoing preparations to start HD. - monitor kidney function  Anemia of chronic disease. Hgb today 8.6, stable through admission has been 7.9-7.2, although patient has received blood transfusions during admission when hemoglobin <7.0. - monitor CBC  T2DM. a1c 6.9. CBGs mid 100s over last 24 hours, likely d/t D5W and steroids.  On 28 units Lantus and rSSI. - watch sugars closely as patient just stopped steroids - rSSI - continue Lantus 28U qd - monitor CBGs  HLD. - continue atorvastatin  Hypothyroidism. TSH 3.338 - continue levothyroxine  Gout - continue allopurinol  Drowsiness Some clinic notes indicate that he would frequently fall asleep during clinic visits, but he is usually more alert than he has been lately.  Will stop any iatrogenic causes (trazodone, perhaps clonidine if blood pressure normalizes) and obtain early morning ABG tomorrow as well as ammonia. - stop trazodone  FEN/GI: full liquid diet PPx: heparin SQ  Disposition: pending medical management  Subjective:  Very drowsy this morning and only opened eyes with sternal rub.  Objective: Temp:  [98 F (36.7 C)-99.1 F (37.3 C)] 98 F (36.7 C) (06/26 0515) Pulse Rate:  [78-81] 79 (06/26 0636) Resp:  [16-24] 18 (06/26 0636) BP: (140-185)/(61-98) 154/98 (06/26 0636) SpO2:  [94 %-100 %] 100 % (06/26 0636) Weight:  [176 lb 5.9 oz  (80 kg)] 176 lb 5.9 oz (80 kg) (06/26 0410) Physical Exam: General: asleep in bed, only responded to yelling first name and sternal rub Cardiovascular: Regular rate and rhythm, normal S1-S2, no murmurs Respiratory: Normal effort on 2 L via nasal cannula, clear breath sounds  Abdomen: Soft, nondistended, nontender, positive bowel sounds Extremities: no LE edema, L AKA Neuro: minimally alert  Laboratory: Recent Labs  Lab 07/27/17 0524 07/28/17 0631 07/29/17 0529  WBC 6.9 9.4 6.2  HGB 7.4* 7.9* 8.6*  HCT 24.8* 25.6* 28.7*  PLT 213 199 174   Recent Labs  Lab 07/27/17 0524 07/28/17 0631 07/29/17 0529  NA 150* 147* 146*  K 5.3* 4.4 4.4  CL 120* 118* 117*  CO2 23 22 19*  BUN 80* 84* 84*  CREATININE 2.56* 2.31* 2.04*  CALCIUM 8.5* 8.4* 8.2*  PROT 5.2*  --   --   GLUCOSE 208* 181* 139*   Mag 07/25/17: 2.5  Eosinophil count absolute 07/24/17: 0.0   Imaging/Diagnostic Tests: Dg Chest 2 View  Result Date: 07/27/2017 CLINICAL DATA:  Pleural effusion. EXAM: CHEST - 2 VIEW COMPARISON:  Chest x-rays dated 07/25/2017 and 03/21/2016 and chest CT dated 07/11/2017 FINDINGS: Feeding tube tip below the diaphragm. Large right pleural effusion is essentially unchanged considering different patient position. Pulmonary edema has significantly improved. Minimal left effusion. Heart size is normal. No acute bone abnormality. IMPRESSION: Pulmonary edema has significantly improved since the prior study with minimal residual edema. No significant change in the large right pleural effusion considering differences in position. Persistent small left effusion. Electronically Signed   By: Lorriane Shire M.D.   On: 07/27/2017 08:30   Dg Chest Port 1 View  Result Date: 07/25/2017 CLINICAL DATA:  CHF. EXAM: PORTABLE CHEST 1 VIEW COMPARISON:  07/24/2017 FINDINGS: A feeding tube courses into the left upper abdomen with tip not imaged. 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 The cardiac silhouette remains  enlarged. Pulmonary vascular congestion and interstitial edema are similar to the prior study. A large right pleural effusion also has not significantly changed, and there is associated right lung atelectasis. No pneumothorax is identified. IMPRESSION: Unchanged pulmonary edema and large right pleural effusion. Electronically Signed   By: Logan Bores M.D.   On: 07/25/2017 08:50   Dg Swallowing Func-speech Pathology  Result Date: 07/27/2017 Objective Swallowing Evaluation: Type of Study: MBS-Modified Barium Swallow Study  Patient Details Name: DAUNTAE DERUSHA MRN: [id number] Date of Birth: April 12, 1950 Today's Date: 07/27/2017 Time: SLP Start Time (ACUTE ONLY): 1440 -SLP Stop Time (ACUTE ONLY): 1505 SLP Time Calculation (min) (ACUTE ONLY): 25 min Past Medical History: Past Medical History: Diagnosis Date . Cancer ([place])   skin cancer- . CHF (congestive heart failure) ([place])  . Chronic kidney disease   not on dialysis . Coronary artery disease  . Gangrene of toe ([place])   right 5th/notes 01/04/2013  . High cholesterol  . Hypertension  . Iron deficiency anemia 08/03/2015 . 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 PAD (peripheral artery disease) ([place])  . Shortness of breath dyspnea   with exertion . Type II diabetes mellitus ([place])   Type II Past Surgical History: Past Surgical History: Procedure Laterality Date . ABDOMINAL AORTOGRAM W/LOWER  EXTREMITY N/A 03/12/2017  Procedure: ABDOMINAL AORTOGRAM W/LOWER EXTREMITY;  Surgeon: Conrad Crescent, MD;  Location: [facility];  Service: Cardiovascular;  Laterality: N/A; . AMPUTATION Right 01/28/2013  Procedure: RAY AMPUTATION RIGHT  4th & 5th TOE;  Surgeon: Rosetta Posner, MD;  Location: [facility];  Service: Vascular;  Laterality: Right; . AMPUTATION Left 03/15/2017  Procedure: AMPUTATION ABOVE KNEE;  Surgeon: Waynetta Sandy, MD;  Location: [facility];  Service: Vascular;  Laterality: Left; . ANGIOPLASTY / STENTING FEMORAL Right 01/13/2013  superficial  femoral artery x 2 (3 mm x 60 mm, 4 mm x 60 mm) . AV FISTULA PLACEMENT Left 02/11/2016  Procedure: LEFT UPPER ARM BRACHIAL CEPHALIC ARTERIOVENOUS (AV) FISTULA CREATION;  Surgeon: Conrad Spencer, MD;  Location[facility];  Service: Vascular;  Laterality: Left; . COLONOSCOPY W/ POLYPECTOMY   . CORONARY ARTERY BYPASS GRAFT N/A 04/07/2013  Procedure: CORONARY ARTERY BYPASS GRAFTING (CABG);  Surgeon: Ivin Poot, MD;  Location: [facility];  Service: Open Heart Surgery;  Laterality: N/A; . INTRAOPERATIVE TRANSESOPHAGEAL ECHOCARDIOGRAM N/A 04/07/2013  Procedure: INTRAOPERATIVE TRANSESOPHAGEAL ECHOCARDIOGRAM;  Surgeon: Ivin Poot, MD;  Location: [facility];  Service: Open Heart Surgery;  Laterality: N/A; . LEFT HEART CATHETERIZATION WITH CORONARY ANGIOGRAM N/A 04/05/2013  Procedure: LEFT HEART CATHETERIZATION WITH CORONARY ANGIOGRAM;  Surgeon: Peter M Martinique, MD;  Location: [facility] C[facility] LAB;  Service: Cardiovascular;  Laterality: N/A; . LOWER EXTREMITY ANGIOGRAM Bilateral 01/06/2013  Procedure: LOWER EXTREMITY ANGIOGRAM;  Surgeon: Conrad Jamul, MD;  Location: [facility] CATH LAB;  Service: Cardiovascular;  Laterality: Bilateral; . LOWER EXTREMITY ANGIOGRAM Right 01/13/2013  Procedure: LOWER EXTREMITY ANGIOGRAM;  Surgeon: Conrad [place], MD;  Location: [facility] C[facility] LAB;  Service: Cardiovascular;  Laterality: Right; . SKIN CANCER EXCISION  2017 . THROAT SURGERY  1983  polyps . TONSILLECTOMY AND ADENOIDECTOMY  ~ 1965 HPI: HPI: 67 year old man with a history of diabetes, peripheral vascular disease, hypertension and chronic kidney disease.  He was admitted with orthopnea that progressed quickly to fulminant respiratory failure.  He required mechanical ventilation 6/5 and for a period of time was on high [name], neuromuscular blockade.  He has been aggressively diuresed and blood pressure tightly controlled.  He self extubated and 6/12 and has tolerated. Pulled Cortrak.  No data recorded Assessment / Plan / Recommendation CHL IP CLINICAL IMPRESSIONS 07/27/2017  Clinical Impression Pt presenting with a moderate oropharyngeal dysphagia which appears improved in comparison to previous MBS. Oral phase characterized by lingual incoordination especially with cup sips, along with piecemeal swallowing and premature spillage, decreased base of tongue retraction and silent penetration to the level of the vocal folds with cup sip of thin liquid; cued cough somewhat effective in clearing residuals. With sips by straw, oral coordination is improved and while pt continues to have premature spillage to the pyriform sinuses (bolus sits in pyriforms for ~1 second prior to swallow initiation), pt consistently demonstrated good airway protection and no penetration/ aspiration by straw. With puree consistency, pt had delayed and effortful oral transit but good airway protection and trace residuals in the vallecula/ pyriform sinuses. The greatest concern is the pt's impulsivity as he needed verbal/ tactile cues to take small sips/ 1 sip at a time by straw. Recommend cautiously initiating full liquid diet, meds crushed in puree, full supervision- only feed when alert, cue small sips- 1 sip at a time. SLP will continue to follow closely for diet tolerance/ consider advancement as mentation improves. SLP Visit Diagnosis Dysphagia, oropharyngeal phase (R13.12) Attention and concentration  deficit following -- Frontal lobe and executive function deficit following -- Impact on safety and function Moderate aspiration risk   CHL IP TREATMENT RECOMMENDATION 07/27/2017 Treatment Recommendations Therapy as outlined in treatment plan below   Prognosis 07/27/2017 Prognosis for Safe Diet Advancement Good Barriers to Reach Goals Cognitive deficits;Time post onset Barriers/Prognosis Comment -- CHL IP DIET RECOMMENDATION 07/27/2017 SLP Diet Recommendations Thin liquid;Other (Comment) Liquid Administration via Straw Medication Administration Crushed with puree Compensations Minimize environmental distractions;Slow  rate;Small sips/bites Postural Changes Seated upright at 90 degrees   CHL IP OTHER RECOMMENDATIONS 07/27/2017 Recommended Consults -- Oral Care Recommendations Oral care BID Other Recommendations --   CHL IP FOLLOW UP RECOMMENDATIONS 07/27/2017 Follow up Recommendations Skilled Nursing facility   [facility] IP FREQUENCY AND DURATION 07/27/2017 Speech Therapy Frequency (ACUTE ONLY) min 2x/week Treatment Duration 2 weeks      CHL IP ORAL PHASE 07/27/2017 Oral Phase Impaired Oral - Pudding Teaspoon -- Oral - Pudding Cup -- Oral - Honey Teaspoon -- Oral - Honey Cup -- Oral - Nectar Teaspoon -- Oral - Nectar Cup -- Oral - Nectar Straw -- Oral - Thin Teaspoon -- Oral - Thin Cup Reduced posterior propulsion;Piecemeal swallowing;Delayed oral transit;Premature spillage Oral - Thin Straw Reduced posterior propulsion;Piecemeal swallowing;Delayed oral transit;Premature spillage Oral - Puree Reduced posterior propulsion;Delayed oral transit Oral - Mech Soft -- Oral - Regular -- Oral - Multi-Consistency -- Oral - Pill -- Oral Phase - Comment --  CHL IP PHARYNGEAL PHASE 07/27/2017 Pharyngeal Phase Impaired Pharyngeal- Pudding Teaspoon -- Pharyngeal -- Pharyngeal- Pudding Cup -- Pharyngeal -- Pharyngeal- Honey Teaspoon -- Pharyngeal -- Pharyngeal- Honey Cup -- Pharyngeal -- Pharyngeal- Nectar Teaspoon -- Pharyngeal -- Pharyngeal- Nectar Cup -- Pharyngeal -- Pharyngeal- Nectar Straw -- Pharyngeal -- Pharyngeal- Thin Teaspoon -- Pharyngeal -- Pharyngeal- Thin Cup Delayed swallow initiation-pyriform sinuses;Reduced tongue base retraction;Penetration/Aspiration before swallow Pharyngeal Material enters airway, CONTACTS cords and not ejected out Pharyngeal- Thin Straw Delayed swallow initiation-pyriform sinuses;Reduced tongue base retraction Pharyngeal -- Pharyngeal- Puree Pharyngeal residue - valleculae Pharyngeal -- Pharyngeal- Mechanical Soft -- Pharyngeal -- Pharyngeal- Regular -- Pharyngeal -- Pharyngeal- Multi-consistency -- Pharyngeal --  Pharyngeal- Pill -- Pharyngeal -- Pharyngeal Comment --  CHL IP CERVICAL ESOPHAGEAL PHASE 07/27/2017 Cervical Esophageal Phase WFL Pudding Teaspoon -- Pudding Cup -- Honey Teaspoon -- Honey Cup -- Nectar Teaspoon -- Nectar Cup -- Nectar Straw -- Thin Teaspoon -- Thin Cup -- Thin Straw -- Puree -- Mechanical Soft -- Regular -- Multi-consistency -- Pill -- Cervical Esophageal Comment -- No flowsheet data found. Kern Reap, MA, CCC-SLP 07/27/2017, 3:40 PM [id number]               Kathrene Alu, MD 07/29/2017, 7:05 AM PGY-1, [place] Intern pager: [phone], text pages welcome

## 2017-07-29 NOTE — NC FL2 (Signed)
[place] MEDICAID FL2 LEVEL OF CARE SCREENING TOOL     IDENTIFICATION  [name] Birthdate: 05-12-1950 Sex: male Admission Date (Current Location): 07/05/2017  [facility] and [place] Number:  Herbalist and Address:  [facility], [place] [address], [place], [place] [address]      Provider Number: [id number]  Attending Physician Name and Address:  Lind Covert, MD  Relative Name and Phone Number:  [name] sister, [phone]    Current Level of Care: Hospital Recommended Level of Care: [place] Prior Approval Number:    Date Approved/Denied:   PASRR Number: [id number]  Discharge Plan: SNF    Current Diagnoses: Patient Active Problem List   Diagnosis Date Noted  . Pneumonitis 07/26/2017  . Pulmonary artery hypertension ([place]) 07/25/2017  . HCAP (healthcare-associated pneumonia)   . Pressure injury of skin 07/20/2017  . Hypernatremia   . Acute exacerbation of CHF (congestive heart failure) ([place]) 07/05/2017  . Atherosclerosis of aorta with gangrene ([place]) 03/12/2017  . Atherosclerotic peripheral vascular disease with gangrene ([place]) 02/14/2017  . Encounter for vitamin deficiency screening 02/11/2017  . Collapse of left talus 01/14/2017  . Diabetic polyneuropathy associated with type 2 diabetes mellitus ([place]) 01/14/2017  . 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 Idiopathic chronic gout of left foot without tophus   . Left foot pain 10/29/2016  . Long term (current) use of anticoagulants [Z79.01] 09/09/2016  . Chronic deep vein thrombosis (DVT) of femoral vein of left lower extremity ([place]) 09/03/2016  . Pleural effusion   . Morbid obesity due to excess calories ([place]) 03/17/2016  . Chronic anemia 02/28/2016  . Fatigue associated with anemia 01/21/2016  . Diabetic retinopathy ([place]) 11/13/2015  . CHF (congestive heart failure) ([place]) 08/27/2015  . Iron deficiency anemia  08/03/2015  . Claudication ([place])   . Diabetes ([place])   . Hematuria 06/27/2015  . CKD (chronic kidney disease) stage 3, GFR 30-59 ml/min (HCC)   . Skin lesion of face 02/22/2015  . 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 Deficiency anemia 01/15/2015  . CKD (chronic kidney disease) stage 4, GFR 15-29 ml/min (HCC) 01/15/2015  . Hypothyroidism 11/22/2014  . Acute on chronic diastolic congestive heart failure ([place]) 07/04/2014  . COPD GOLD III  02/16/2014  . Acute respiratory failure with hypoxia ([place])   . ARDS (adult respiratory distress syndrome) ([place]) 02/10/2014  . History of tobacco use 08/23/2013  . 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 CAD (coronary artery disease) 04/06/2013  . PVD - hx of Rt SFA PTA and s/p Rt 4-5th toe amp Dec 2014 04/05/2013  . Essential hypertension 11/25/2012  . Mixed hyperlipidemia 07/17/2008    Orientation RESPIRATION BLADDER Height & Weight     Self, Place, Time  O2(Nasal cannula 2L) Incontinent, External catheter Weight: 80 kg (176 lb 5.9 oz) Height:  5\' 8"  (172.7 cm)  BEHAVIORAL SYMPTOMS/MOOD NEUROLOGICAL BOWEL NUTRITION STATUS      Incontinent Diet(Please see DC Summary)  AMBULATORY STATUS COMMUNICATION OF NEEDS Skin   Extensive Assist Verbally PU Stage and Appropriate Care(Stage II on coccyx; wound on foot)                       Personal Care Assistance Level of Assistance  Bathing, Feeding, Dressing Bathing Assistance: Maximum assistance Feeding assistance: Maximum assistance Dressing Assistance: Maximum assistance     Functional Limitations Info  Sight, Hearing, Speech Sight Info:  Adequate Hearing Info: Adequate Speech Info: Adequate    SPECIAL CARE FACTORS FREQUENCY                       Contractures      Additional Factors Info  Code Status, Allergies, Psychotropic, Insulin Sliding Scale Code Status Info: Partial Allergies Info: NKA Psychotropic Info: Clonidine Insulin Sliding Scale Info: Every 4  horus       Current Medications (07/29/2017):  This is the current hospital active medication list Current Facility-Administered Medications  Medication Dose Route Frequency Provider Last Rate Last Dose  . 0.9 %  sodium chloride infusion   Intravenous Continuous Guadalupe Dawn, MD   Stopped at 07/26/17 2213  . acetaminophen (TYLENOL) solution 650 mg  650 mg Per Tube Q4H PRN Simonne Maffucci B, MD   650 mg at 07/18/17 1217  . albuterol (PROVENTIL) (2.5 MG/3ML) 0.083% nebulizer solution 2.5 mg  2.5 mg Nebulization Q4H PRN Simonne Maffucci B, MD   2.5 mg at 07/21/17 0724  . allopurinol (ZYLOPRIM) tablet 100 mg  100 mg Per Tube Daily Simonne Maffucci B, MD   100 mg at 07/29/17 0903  . amLODipine (NORVASC) tablet 10 mg  10 mg Per Tube Daily Juanito Doom, MD   10 mg at 07/29/17 0904  . atorvastatin (LIPITOR) tablet 40 mg  40 mg Per Tube q1800 Simonne Maffucci B, MD   40 mg at 07/28/17 1756  . carvedilol (COREG) tablet 25 mg  25 mg Per Tube BID WC Simonne Maffucci B, MD   25 mg at 07/29/17 0859  . cloNIDine (CATAPRES - Dosed in mg/24 hr) patch 0.2 mg  0.2 mg Transdermal Weekly Simonne Maffucci B, MD   0.2 mg at 07/26/17 2347  . dextrose 5 % solution   Intravenous Continuous Juanito Doom, MD 20 mL/hr at 07/25/17 2100    . doxazosin (CARDURA) tablet 8 mg  8 mg Per Tube QHS Juanito Doom, MD   8 mg at 07/28/17 2115  . feeding supplement (VITAL AF 1.2 CAL) liquid 1,000 mL  1,000 mL Per Tube Q24H Juanito Doom, MD   Stopped at 07/29/17 1140  . ferrous sulfate 300 (60 Fe) MG/5ML syrup 300 mg  300 mg Per Tube BID WC Simonne Maffucci B, MD   300 mg at 07/29/17 0858  . heparin injection 5,000 Units  5,000 Units Subcutaneous Q8H Yoo, Elsia J, DO   5,000 Units at 07/29/17 1312  . hydrALAZINE (APRESOLINE) injection 20 mg  20 mg Intravenous Q4H PRN Simonne Maffucci B, MD   20 mg at 07/21/17 1450  . hydrALAZINE (APRESOLINE) tablet 100 mg  100 mg Per Tube Q8H Simonne Maffucci B, MD   100 mg at  07/29/17 1528  . insulin aspart (novoLOG) injection 0-20 Units  0-20 Units Subcutaneous Q4H Juanito Doom, MD   4 Units at 07/29/17 1309  . insulin aspart (novoLOG) injection 3 Units  3 Units Subcutaneous Q4H Juanito Doom, MD   3 Units at 07/29/17 [phone]  . insulin glargine (LANTUS) injection 28 Units  28 Units Subcutaneous Daily Kathrene Alu, MD   28 Units at 07/29/17 0908  . isosorbide dinitrate (ISORDIL) tablet 20 mg  20 mg Per Tube TID Simonne Maffucci B, MD   20 mg at 07/29/17 1529  . labetalol (NORMODYNE,TRANDATE) injection 20 mg  20 mg Intravenous Q2H PRN Juanito Doom, MD   20 mg at 07/20/17 0543  . levothyroxine (SYNTHROID, LEVOTHROID) tablet  100 mcg  100 mcg Per Tube QAC breakfast Simonne Maffucci B, MD   100 mcg at 07/29/17 0859  . MEDLINE mouth rinse  15 mL Mouth Rinse BID Simonne Maffucci B, MD   15 mL at 07/29/17 1000  . minoxidil (LONITEN) tablet 5 mg  5 mg Per Tube Daily Juanito Doom, MD   5 mg at 07/29/17 0904  . ondansetron (ZOFRAN) injection 4 mg  4 mg Intravenous Q6H PRN Juanito Doom, MD       Facility-Administered Medications Ordered in Other Encounters  Medication Dose Route Frequency Provider Last Rate Last Dose  . sodium chloride flush (NS) 0.9 % injection 10 mL  10 mL Intracatheter PRN Heath Lark, MD         Discharge Medications: Please see discharge summary for a list of discharge medications.  Relevant Imaging Results:  Relevant Lab Results:   Additional Information SS#: [id number]  Benard Halsted, LCSWA

## 2017-07-30 LAB — BASIC METABOLIC PANEL
Anion gap: 7 (ref 5–15)
BUN: 78 mg/dL — ABNORMAL HIGH (ref 8–23)
CO2: 21 mmol/L — AB (ref 22–32)
CREATININE: 1.73 mg/dL — AB (ref 0.61–1.24)
Calcium: 7.5 mg/dL — ABNORMAL LOW (ref 8.9–10.3)
Chloride: 115 mmol/L — ABNORMAL HIGH (ref 98–111)
GFR calc Af Amer: 46 mL/min — ABNORMAL LOW (ref >60)
GFR calc non Af Amer: 39 mL/min — ABNORMAL LOW (ref >60)
GLUCOSE: 105 mg/dL — AB (ref 70–99)
Potassium: 4.3 mmol/L (ref 3.5–5.1)
Sodium: 143 mmol/L (ref 135–145)

## 2017-07-30 LAB — GLUCOSE, CAPILLARY
GLUCOSE-CAPILLARY: 117 mg/dL — AB (ref 70–99)
Glucose-Capillary: 106 mg/dL — ABNORMAL HIGH (ref 70–99)
Glucose-Capillary: 110 mg/dL — ABNORMAL HIGH (ref 70–99)
Glucose-Capillary: 133 mg/dL — ABNORMAL HIGH (ref 70–99)
Glucose-Capillary: 145 mg/dL — ABNORMAL HIGH (ref 70–99)
Glucose-Capillary: 88 mg/dL (ref 70–99)
Glucose-Capillary: 96 mg/dL (ref 70–99)

## 2017-07-30 LAB — BLOOD GAS, ARTERIAL
Acid-base deficit: 2.3 mmol/L — ABNORMAL HIGH (ref 0.0–2.0)
Bicarbonate: 21.3 mmol/L (ref 20.0–28.0)
Drawn by: 330991
O2 CONTENT: 2 L/min
O2 SAT: 95.6 %
PCO2 ART: 32.8 mmHg (ref 32.0–48.0)
PH ART: 7.429 (ref 7.350–7.450)
PO2 ART: 78.2 mmHg — AB (ref 83.0–108.0)
Patient temperature: 99.1

## 2017-07-30 LAB — AMMONIA: Ammonia: 29 umol/L (ref 9–35)

## 2017-07-30 LAB — CBC
HCT: 25.6 % — ABNORMAL LOW (ref 39.0–52.0)
Hemoglobin: 7.8 g/dL — ABNORMAL LOW (ref 13.0–17.0)
MCH: 27 pg (ref 26.0–34.0)
MCHC: 30.5 g/dL (ref 30.0–36.0)
MCV: 88.6 fL (ref 78.0–100.0)
PLATELETS: 164 10*3/uL (ref 150–400)
RBC: 2.89 MIL/uL — ABNORMAL LOW (ref 4.22–5.81)
RDW: 17.7 % — ABNORMAL HIGH (ref 11.5–15.5)
WBC: 4.8 10*3/uL (ref 4.0–10.5)

## 2017-07-30 MED ORDER — FERROUS SULFATE 325 (65 FE) MG PO TABS
300.0000 mg | ORAL_TABLET | Freq: Two times a day (BID) | ORAL | Status: DC
  Administered 2017-07-31 – 2017-08-07 (×14): 325 mg via ORAL
  Filled 2017-07-30 (×17): qty 1

## 2017-07-30 MED ORDER — ALLOPURINOL 100 MG PO TABS
100.0000 mg | ORAL_TABLET | Freq: Every day | ORAL | Status: DC
  Administered 2017-07-31 – 2017-08-07 (×8): 100 mg via ORAL
  Filled 2017-07-30 (×9): qty 1

## 2017-07-30 MED ORDER — HYDRALAZINE HCL 50 MG PO TABS
100.0000 mg | ORAL_TABLET | Freq: Three times a day (TID) | ORAL | Status: DC
  Administered 2017-07-30 – 2017-08-07 (×24): 100 mg via ORAL
  Filled 2017-07-30 (×26): qty 2

## 2017-07-30 MED ORDER — ACETAMINOPHEN 160 MG/5ML PO SOLN
650.0000 mg | ORAL | Status: DC | PRN
  Administered 2017-08-06: 650 mg via ORAL
  Filled 2017-07-30: qty 20.3

## 2017-07-30 MED ORDER — LEVOTHYROXINE SODIUM 100 MCG PO TABS
100.0000 ug | ORAL_TABLET | Freq: Every day | ORAL | Status: DC
  Administered 2017-07-31 – 2017-08-07 (×8): 100 ug via ORAL
  Filled 2017-07-30 (×8): qty 1

## 2017-07-30 MED ORDER — TORSEMIDE 20 MG PO TABS
20.0000 mg | ORAL_TABLET | Freq: Every day | ORAL | Status: DC
  Administered 2017-07-30 – 2017-08-04 (×6): 20 mg via ORAL
  Filled 2017-07-30 (×6): qty 1

## 2017-07-30 MED ORDER — MINOXIDIL 2.5 MG PO TABS
5.0000 mg | ORAL_TABLET | Freq: Every day | ORAL | Status: DC
  Administered 2017-07-31 – 2017-08-07 (×8): 5 mg via ORAL
  Filled 2017-07-30 (×8): qty 2

## 2017-07-30 MED ORDER — INSULIN GLARGINE 100 UNIT/ML ~~LOC~~ SOLN
24.0000 [IU] | Freq: Every day | SUBCUTANEOUS | Status: DC
  Administered 2017-07-30 – 2017-08-07 (×9): 24 [IU] via SUBCUTANEOUS
  Filled 2017-07-30 (×9): qty 0.24

## 2017-07-30 MED ORDER — ISOSORBIDE DINITRATE 20 MG PO TABS
20.0000 mg | ORAL_TABLET | Freq: Three times a day (TID) | ORAL | Status: DC
  Administered 2017-07-30 – 2017-08-07 (×22): 20 mg via ORAL
  Filled 2017-07-30 (×26): qty 1

## 2017-07-30 MED ORDER — ENSURE ENLIVE PO LIQD
237.0000 mL | Freq: Three times a day (TID) | ORAL | Status: DC
  Administered 2017-07-30 – 2017-08-07 (×19): 237 mL via ORAL

## 2017-07-30 MED ORDER — AMLODIPINE BESYLATE 10 MG PO TABS
10.0000 mg | ORAL_TABLET | Freq: Every day | ORAL | Status: DC
  Administered 2017-07-31 – 2017-08-07 (×8): 10 mg via ORAL
  Filled 2017-07-30 (×8): qty 1

## 2017-07-30 MED ORDER — ATORVASTATIN CALCIUM 40 MG PO TABS
40.0000 mg | ORAL_TABLET | Freq: Every day | ORAL | Status: DC
  Administered 2017-07-31 – 2017-08-06 (×6): 40 mg via ORAL
  Filled 2017-07-30 (×8): qty 1

## 2017-07-30 MED ORDER — CARVEDILOL 25 MG PO TABS
25.0000 mg | ORAL_TABLET | Freq: Two times a day (BID) | ORAL | Status: DC
Start: 2017-07-30 — End: 2017-08-07
  Administered 2017-07-31 – 2017-08-07 (×14): 25 mg via ORAL
  Filled 2017-07-30 (×16): qty 1

## 2017-07-30 MED ORDER — DOXAZOSIN MESYLATE 8 MG PO TABS
8.0000 mg | ORAL_TABLET | Freq: Every day | ORAL | Status: DC
  Administered 2017-07-30 – 2017-08-06 (×8): 8 mg via ORAL
  Filled 2017-07-30 (×8): qty 1

## 2017-07-30 MED ORDER — INSULIN ASPART 100 UNIT/ML ~~LOC~~ SOLN
0.0000 [IU] | Freq: Three times a day (TID) | SUBCUTANEOUS | Status: DC
  Administered 2017-07-31 (×3): 3 [IU] via SUBCUTANEOUS
  Administered 2017-08-01: 7 [IU] via SUBCUTANEOUS
  Administered 2017-08-01: 3 [IU] via SUBCUTANEOUS
  Administered 2017-08-01 – 2017-08-02 (×2): 4 [IU] via SUBCUTANEOUS
  Administered 2017-08-02: 7 [IU] via SUBCUTANEOUS
  Administered 2017-08-02: 3 [IU] via SUBCUTANEOUS
  Administered 2017-08-03: 11 [IU] via SUBCUTANEOUS
  Administered 2017-08-04 – 2017-08-05 (×3): 4 [IU] via SUBCUTANEOUS
  Administered 2017-08-05 – 2017-08-06 (×2): 3 [IU] via SUBCUTANEOUS
  Administered 2017-08-07: 4 [IU] via SUBCUTANEOUS

## 2017-07-30 NOTE — Progress Notes (Addendum)
Family Medicine Teaching Service Daily Progress Note Intern Pager: [phone]  [name] Medical record number: [id number] Date of birth: 05/21/1950 Age: 67 y.o. Gender: male  Primary Care Provider: [name], MD Consultants: CCM Code Status: DNR/DNI, BiPAP or NIPPV only  Pt Overview and Major Events to Date:  07/05/17 admitted for acute respiratory failure  07/07/17 transferred to ICU for resp distress 07/25/17 out to FPTS  Assessment and Plan: Leonard [name] is a 67 y.o. male who presented with SOB and developed acute respiratory failure. PMH is significant for COPD, CKD stage 4, CAD s/p CABG, T2DM, HTN, HLD.  Acute hypoxic respiratory failure with hypoxemia.  Improving.  Uncertain etiology.  Most likely a combination of CHF exacerbation as well as a steroid responsive pneumonitis.  Per CCM unknown specific pathology.  Does have a large right pleural effusion that may be contributing.  Patient is afebrile and WBC is wnl.  Repeat CXR on 6/26 showed no change in R sided pleural effusion so we are continuing to monitor with weekly CXR, next on 7/3. - hold off on thoracentesis, will obtain CXR on 7/3 to monitor progress -s/p ceftaz (6/15-6/19) for HCAP, now monitoring off ABX -monitor fluid status, strict I/Os, daily wts -monitor on telemetry -NG clamped yesterday, discontinue NG today - wean O2 as tolerated - start Torsemide 20 mg PO daily  Chest pain, mild, acute. Patient reported 3/10 chest pain around left nipple early morning of 6/26.  EKG was unchanged from previous and had no ST changes, troponin unchanged from baseline at 0.04.  Patient was sleeping soon after reporting chest pain.   - can repeat EKG and troponin if pain becomes more severe  HTN. BP 158/83 this morning, stable. - monitor vitals - continue amlodipine 10mg  qd, Coreg 25mg  BID, clonidine 0.2 mg patch, doxazosin 8mg  qhs, hydralazine 100mg  q8h, isosorbide dinitrate 20mg  TID, minoxidil 5mg   QD - prn IV hydralazine for SBP>160 or DBP>100 - prn labetalol for SBP>130  Hypernatremia, resolved. Na 151 on 6/19 >143 on 6/27. Free water was restarted on 6/23. - continue D5W20cc/hr - clamp NG tube to encourage PO intake of fluids - monitor BMP  Hypokalemia, resolved. K 4.3 on 6/27 - potassium supplementation stopped on 6/23 - monitor BMP  CKD IV. Cr 1.73 on 6/27, appears to be at baseline ~2.5. Follows with Dr. Marval Regal and has been undergoing preparations to start HD. - monitor kidney function  Anemia of chronic disease. Hgb today 8.6, stable through admission has been 7.9-7.2, although patient has received blood transfusions during admission when hemoglobin <7.0. - monitor CBC  T2DM. a1c 6.9. CBGs mid 100s over last 24 hours, likely d/t D5W and steroids.  On 28 units Lantus and rSSI. - watch sugars closely as patient just stopped steroids - rSSI - decrease Lantus to 28U qd - monitor CBGs  HLD. - continue atorvastatin  Hypothyroidism. TSH 3.338 - continue levothyroxine  Gout - continue allopurinol  Drowsiness Some clinic notes indicate that he would frequently fall asleep during clinic visits, but he is usually more alert than he has been lately.  Will stop any iatrogenic causes (trazodone, perhaps clonidine if blood pressure normalizes).  Normal ammonia and CO2 level on ABG on 6/27.  More alert on 6/27 - stop trazodone  FEN/GI: full liquid diet PPx: heparin SQ  Disposition: pending medical management  Subjective:  Much more alert today, wanting water.  Objective: Temp:  [98.1 F (36.7 C)-99.1 F (37.3 C)] 99.1 F (37.3 C) (  06/27 0330) Pulse Rate:  [73-80] 75 (06/27 0330) Resp:  [11-20] 17 (06/27 0330) BP: (144-166)/(57-74) 164/70 (06/27 0551) SpO2:  [97 %-100 %] 98 % (06/27 0330) Weight:  [177 lb 11.1 oz (80.6 kg)] 177 lb 11.1 oz (80.6 kg) (06/27 0328) Physical Exam: General: sitting up in bed, alert and oriented x 3 Cardiovascular: Regular rate and  rhythm, normal S1-S2, no murmurs Respiratory: Normal effort on 2 L via nasal cannula, clear breath sounds, reduced air movement on R side Abdomen: Soft, nondistended, nontender, positive bowel sounds Extremities: no LE edema, L AKA Neuro: alert and oriented, drinking water from straw  Laboratory: Recent Labs  Lab 07/28/17 0631 07/29/17 0529 07/30/17 0442  WBC 9.4 6.2 4.8  HGB 7.9* 8.6* 7.8*  HCT 25.6* 28.7* 25.6*  PLT 199 174 164   Recent Labs  Lab 07/27/17 0524 07/28/17 0631 07/29/17 0529 07/30/17 0442  NA 150* 147* 146* 143  K 5.3* 4.4 4.4 4.3  CL 120* 118* 117* 115*  CO2 23 22 19* 21*  BUN 80* 84* 84* 78*  CREATININE 2.56* 2.31* 2.04* 1.73*  CALCIUM 8.5* 8.4* 8.2* 7.5*  PROT 5.2*  --   --   --   GLUCOSE 208* 181* 139* 105*   Mag 07/25/17: 2.5  Eosinophil count absolute 07/24/17: 0.0   Imaging/Diagnostic Tests: Dg Chest 1 View  Result Date: 07/29/2017 CLINICAL DATA:  Pleural effusion.  Short of breath. EXAM: CHEST  1 VIEW COMPARISON:  07/27/2017 FINDINGS: Bibasilar airspace disease unchanged. Right pleural effusion unchanged. Postop CABG. Feeding tube enters the stomach with the tip not visualized. IMPRESSION: Bibasilar airspace disease and right pleural effusion unchanged. Electronically Signed   By: Franchot Gallo M.D.   On: 07/29/2017 10:54   Dg Chest 2 View  Result Date: 07/27/2017 CLINICAL DATA:  Pleural effusion. EXAM: CHEST - 2 VIEW COMPARISON:  Chest x-rays dated 07/25/2017 and 03/21/2016 and chest CT dated 07/11/2017 FINDINGS: Feeding tube tip below the diaphragm. Large right pleural effusion is essentially unchanged considering different patient position. Pulmonary edema has significantly improved. Minimal left effusion. Heart size is normal. No acute bone abnormality. IMPRESSION: Pulmonary edema has significantly improved since the prior study with minimal residual edema. No significant change in the large right pleural effusion considering differences in  position. Persistent small left effusion. Electronically Signed   By: Lorriane Shire M.D.   On: 07/27/2017 08:30   Dg Swallowing Func-speech Pathology  Result Date: 07/27/2017 Objective Swallowing Evaluation: Type of Study: MBS-Modified Barium Swallow Study  Patient Details Name: DAELEN BELVEDERE MRN: [id number] Date of Birth: [birth year] Today's Date: 07/27/2017 Time: SLP Start Time (ACUTE ONLY): 1440 -SLP Stop Time (ACUTE ONLY): 1505 SLP Time Calculation (min) (ACUTE ONLY): 25 min Past Medical History: Past Medical History: Diagnosis Date . Cancer ([place])   skin cancer- . CHF (congestive heart failure) ([place])  . Chronic kidney disease   not on dialysis . Coronary artery disease  . Gangrene of toe ([place])   right 5th/notes 01/04/2013  . High cholesterol  . Hypertension  . Iron deficiency anemia 08/03/2015 . 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 PAD (peripheral artery disease) ([place])  . Shortness of breath dyspnea   with exertion . Type II diabetes mellitus ([place])   Type II Past Surgical History: Past Surgical History: Procedure Laterality Date . ABDOMINAL AORTOGRAM W/LOWER EXTREMITY N/A 03/12/2017  Procedure: ABDOMINAL AORTOGRAM W/LOWER EXTREMITY;  Surgeon: Conrad Belle Fontaine, MD;  Location: [place] [facility];  Service: Cardiovascular;  Laterality: N/A; .  AMPUTATION Right 01/28/2013  Procedure: RAY AMPUTATION RIGHT  4th & 5th TOE;  Surgeon: Rosetta Posner, MD;  Location: [place];  Service: Vascular;  Laterality: Right; . AMPUTATION Left 03/15/2017  Procedure: AMPUTATION ABOVE KNEE;  Surgeon: Waynetta Sandy, MD;  Location: [place];  Service: Vascular;  Laterality: Left; . ANGIOPLASTY / STENTING FEMORAL Right 01/13/2013  superficial femoral artery x 2 (3 mm x 60 mm, 4 mm x 60 mm) . AV FISTULA PLACEMENT Left 02/11/2016  Procedure: LEFT UPPER ARM BRACHIAL CEPHALIC ARTERIOVENOUS (AV) FISTULA CREATION;  Surgeon: Conrad Warwick, MD;  Location: [place];  Service: Vascular;  Laterality: Left; . COLONOSCOPY W/  POLYPECTOMY   . CORONARY ARTERY BYPASS GRAFT N/A 04/07/2013  Procedure: CORONARY ARTERY BYPASS GRAFTING (CABG);  Surgeon: Ivin Poot, MD;  Location: [place];  Service: Open Heart Surgery;  Laterality: N/A; . INTRAOPERATIVE TRANSESOPHAGEAL ECHOCARDIOGRAM N/A 04/07/2013  Procedure: INTRAOPERATIVE TRANSESOPHAGEAL ECHOCARDIOGRAM;  Surgeon: Ivin Poot, MD;  Location: [place];  Service: Open Heart Surgery;  Laterality: N/A; . LEFT HEART CATHETERIZATION WITH CORONARY ANGIOGRAM N/A 04/05/2013  Procedure: LEFT HEART CATHETERIZATION WITH CORONARY ANGIOGRAM;  Surgeon: Peter M Martinique, MD;  Location: [facility] CATH LAB;  Service: Cardiovascular;  Laterality: N/A; . LOWER EXTREMITY ANGIOGRAM Bilateral 01/06/2013  Procedure: LOWER EXTREMITY ANGIOGRAM;  Surgeon: Conrad Chandler, MD;  Location: [facility] CATH LAB;  Service: Cardiovascular;  Laterality: Bilateral; . LOWER EXTREMITY ANGIOGRAM Right 01/13/2013  Procedure: LOWER EXTREMITY ANGIOGRAM;  Surgeon: Conrad , MD;  Location: [facility] CATH LAB;  Service: Cardiovascular;  Laterality: Right; . SKIN CANCER EXCISION  2017 . THROAT SURGERY  1983  polyps . TONSILLECTOMY AND ADENOIDECTOMY  ~ 1965 HPI: HPI: 67 year old man with a history of diabetes, peripheral vascular disease, hypertension and chronic kidney disease.  He was admitted with orthopnea that progressed quickly to fulminant respiratory failure.  He required mechanical ventilation 6/5 and for a period of time was on high [name], neuromuscular blockade.  He has been aggressively diuresed and blood pressure tightly controlled.  He self extubated and 6/12 and has tolerated. Pulled Cortrak.  No data recorded Assessment / Plan / Recommendation CHL IP CLINICAL IMPRESSIONS 07/27/2017 Clinical Impression Pt presenting with a moderate oropharyngeal dysphagia which appears improved in comparison to previous MBS. Oral phase characterized by lingual incoordination especially with cup sips, along with piecemeal swallowing and premature spillage, decreased base  of tongue retraction and silent penetration to the level of the vocal folds with cup sip of thin liquid; cued cough somewhat effective in clearing residuals. With sips by straw, oral coordination is improved and while pt continues to have premature spillage to the pyriform sinuses (bolus sits in pyriforms for ~1 second prior to swallow initiation), pt consistently demonstrated good airway protection and no penetration/ aspiration by straw. With puree consistency, pt had delayed and effortful oral transit but good airway protection and trace residuals in the vallecula/ pyriform sinuses. The greatest concern is the pt's impulsivity as he needed verbal/ tactile cues to take small sips/ 1 sip at a time by straw. Recommend cautiously initiating full liquid diet, meds crushed in puree, full supervision- only feed when alert, cue small sips- 1 sip at a time. SLP will continue to follow closely for diet tolerance/ consider advancement as mentation improves. SLP Visit Diagnosis Dysphagia, oropharyngeal phase (R13.12) Attention and concentration deficit following -- Frontal lobe and executive function deficit following -- Impact on safety and function Moderate aspiration risk   CHL IP TREATMENT RECOMMENDATION 07/27/2017 Treatment Recommendations  Therapy as outlined in treatment plan below   Prognosis 07/27/2017 Prognosis for Safe Diet Advancement Good Barriers to Reach Goals Cognitive deficits;Time post onset Barriers/Prognosis Comment -- CHL IP DIET RECOMMENDATION 07/27/2017 SLP Diet Recommendations Thin liquid;Other (Comment) Liquid Administration via Straw Medication Administration Crushed with puree Compensations Minimize environmental distractions;Slow rate;Small sips/bites Postural Changes Seated upright at 90 degrees   CHL IP OTHER RECOMMENDATIONS 07/27/2017 Recommended Consults -- Oral Care Recommendations Oral care BID Other Recommendations --   CHL IP FOLLOW UP RECOMMENDATIONS 07/27/2017 Follow up Recommendations Skilled  Nursing facility   [facility] IP FREQUENCY AND DURATION 07/27/2017 Speech Therapy Frequency (ACUTE ONLY) min 2x/week Treatment Duration 2 weeks      CHL IP ORAL PHASE 07/27/2017 Oral Phase Impaired Oral - Pudding Teaspoon -- Oral - Pudding Cup -- Oral - Honey Teaspoon -- Oral - Honey Cup -- Oral - Nectar Teaspoon -- Oral - Nectar Cup -- Oral - Nectar Straw -- Oral - Thin Teaspoon -- Oral - Thin Cup Reduced posterior propulsion;Piecemeal swallowing;Delayed oral transit;Premature spillage Oral - Thin Straw Reduced posterior propulsion;Piecemeal swallowing;Delayed oral transit;Premature spillage Oral - Puree Reduced posterior propulsion;Delayed oral transit Oral - Mech Soft -- Oral - Regular -- Oral - Multi-Consistency -- Oral - Pill -- Oral Phase - Comment --  CHL IP PHARYNGEAL PHASE 07/27/2017 Pharyngeal Phase Impaired Pharyngeal- Pudding Teaspoon -- Pharyngeal -- Pharyngeal- Pudding Cup -- Pharyngeal -- Pharyngeal- Honey Teaspoon -- Pharyngeal -- Pharyngeal- Honey Cup -- Pharyngeal -- Pharyngeal- Nectar Teaspoon -- Pharyngeal -- Pharyngeal- Nectar Cup -- Pharyngeal -- Pharyngeal- Nectar Straw -- Pharyngeal -- Pharyngeal- Thin Teaspoon -- Pharyngeal -- Pharyngeal- Thin Cup Delayed swallow initiation-pyriform sinuses;Reduced tongue base retraction;Penetration/Aspiration before swallow Pharyngeal Material enters airway, CONTACTS cords and not ejected out Pharyngeal- Thin Straw Delayed swallow initiation-pyriform sinuses;Reduced tongue base retraction Pharyngeal -- Pharyngeal- Puree Pharyngeal residue - valleculae Pharyngeal -- Pharyngeal- Mechanical Soft -- Pharyngeal -- Pharyngeal- Regular -- Pharyngeal -- Pharyngeal- Multi-consistency -- Pharyngeal -- Pharyngeal- Pill -- Pharyngeal -- Pharyngeal Comment --  CHL IP CERVICAL ESOPHAGEAL PHASE 07/27/2017 Cervical Esophageal Phase WFL Pudding Teaspoon -- Pudding Cup -- Honey Teaspoon -- Honey Cup -- Nectar Teaspoon -- Nectar Cup -- Nectar Straw -- Thin Teaspoon -- Thin Cup -- Thin  Straw -- Puree -- Mechanical Soft -- Regular -- Multi-consistency -- Pill -- Cervical Esophageal Comment -- No flowsheet data found. Kern Reap, MA, CCC-SLP 07/27/2017, 3:40 PM [id number]               Kathrene Alu, MD 07/30/2017, 7:10 AM PGY-1, [place] Intern pager: [phone], text pages welcome

## 2017-07-30 NOTE — Progress Notes (Signed)
Nutrition Follow-up  DOCUMENTATION CODES:   Not applicable  INTERVENTION:    Ensure Enlive po TID, each supplement provides 350 kcal and 20 grams of protein  Magic cup TID with meals, each supplement provides 290 kcal and 9 grams of protein  NUTRITION DIAGNOSIS:   Inadequate oral intake related to inability to eat as evidenced by NPO status.  Ongoing  GOAL:   Patient will meet greater than or equal to 90% of their needs  Not meeting  MONITOR:   TF tolerance, PO intake, Weight trends, Skin, I & O's, Labs  REASON FOR ASSESSMENT:   Consult Enteral/tube feeding initiation and management  ASSESSMENT:   67 y.o. M admitted on 07/05/17 for acute respiratory failure with hypoxia and CHF exacerbation. PMH of HTN, PAD, CHF, CAD, hx of skin cancer, T2DM, hx of gangrene of toe 2014, CKD stg IV, and anemia. 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 Pt intubate 6/5.    6/5- intubated 6/12- self extubated 6/14- post pyloric cortrak tube placed 6/24- diet advanced to full liquids 6/27- cortrak removed  RD consulted for calorie and protein estimates. Please refer to the bottom of the page.   Pt unable to elaborate on recent intake. He's more alert but remains lethargic. Per records, it looks like pt had 50% of his full liquid meal this morning. Spoke RN who reports his intake has been minimal since being advanced to fulls. RD to provide supplements to maximize calories and protein for future HD treatments. Will attempt to speak with pt regarding intake once mental status improves.   Weight noted to increase 2 lb since last RD visit on 6/25 (175 lb to 177 lb).   Medications reviewed and include: ferrous sulfate Labs reviewed: Cl 115 (H)  Diet Order:   Diet Order           Diet full liquid Room service appropriate? Yes; Fluid consistency: Thin  Diet effective now          EDUCATION NEEDS:   Not appropriate for education at this time  Skin:  Skin Assessment: Skin Integrity Issues: Skin  Integrity Issues:: Other (Comment), Stage II Stage II: coccyx Other: R foot wound  Last BM:  07/29/17  Height:   Ht Readings from Last 1 Encounters:  07/05/17 5\' 8"  (1.727 m)    Weight:   Wt Readings from Last 1 Encounters:  07/30/17 177 lb 11.1 oz (80.6 kg)    Ideal Body Weight:  64.4 kg  BMI:  Body mass index is 27.02 kg/m.  Estimated Nutritional Needs:   Kcal:  2100-2300 kcal  Protein:  120-135 g  Fluid:  2.1 L  Mariana Single RD, LDN Clinical Nutrition Pager # [phone]

## 2017-07-30 NOTE — Plan of Care (Signed)
  Problem: Education: Goal: Knowledge of General Education information will improve Outcome: Progressing Note:  POC reviewed with pt.; pt. seem to understand some.

## 2017-07-31 LAB — GLUCOSE, CAPILLARY
GLUCOSE-CAPILLARY: 138 mg/dL — AB (ref 70–99)
GLUCOSE-CAPILLARY: 143 mg/dL — AB (ref 70–99)
Glucose-Capillary: 149 mg/dL — ABNORMAL HIGH (ref 70–99)
Glucose-Capillary: 128 mg/dL — ABNORMAL HIGH (ref 70–99)

## 2017-07-31 NOTE — Progress Notes (Signed)
  Speech Language Pathology Treatment: Dysphagia  Patient Details Name: RUMALDO DIFATTA MRN: [id number] DOB: 07-12-1950 Today's Date: 07/31/2017 Time: 4034-7425 SLP Time Calculation (min) (ACUTE ONLY): 12 min  Assessment / Plan / Recommendation Clinical Impression  Pt is sleepy but does not need cues for alertness or sustained attention to PO trials this afternoon. Bolus formation is moderately prolonged with soft solids, which may not be far from baseline given that he is edentulous, but could pose more risk as his mentation is altered. Mod cues were provided for slower rate of intake. No overt signs of aspiration were noted, although aspiration was silent on MBS. He remains afebrile and RN reports good intake with full liquid diet so far. Recommend advancement to Dys 2 diet, continuing thin liquids via straw. Will f/u for tolerance and potential to advance.    HPI HPI: HPI: 67 year old man with a history of diabetes, peripheral vascular disease, hypertension and chronic kidney disease.  He was admitted with orthopnea that progressed quickly to fulminant respiratory failure.  He required mechanical ventilation 6/5 and for a period of time was on high [name], neuromuscular blockade.  He has been aggressively diuresed and blood pressure tightly controlled.  He self extubated and 6/12 and has tolerated. Pulled Cortrak.       SLP Plan  Continue with current plan of care       Recommendations  Diet recommendations: Dysphagia 2 (fine chop);Thin liquid Liquids provided via: Straw Medication Administration: Whole meds with puree Supervision: Patient able to self feed;Full supervision/cueing for compensatory strategies Compensations: Minimize environmental distractions;Slow rate;Small sips/bites Postural Changes and/or Swallow Maneuvers: Seated upright 90 degrees                Oral Care Recommendations: Oral care BID Follow up Recommendations: Skilled Nursing facility SLP Visit Diagnosis:  Dysphagia, oropharyngeal phase (R13.12) Plan: Continue with current plan of care       GO                [name] 07/31/2017, 1:57 PM  [name], M.A. CCC-SLP [phone]

## 2017-07-31 NOTE — Evaluation (Signed)
Physical Therapy Evaluation Patient Details Name: Leonard [name] MRN: [id number] DOB: 11-Jul-1950 Today's Date: 07/31/2017   History of Present Illness   Pt is a 67 y/o male with PMH significant for COPD, CKD, CADs/p CABG, DM, and HTN admitted with pneumonitis  Clinical Impression  Pt presents with decreased functional mobility in the setting of prolonged recovery from L AKA in February.  Wife reports steady decline over last 4 months with pt requiring increasing levels of assist and multiple hospitalizations.  Recommend acute PT f/u for progression of functional mobility in preparation for next level of care, and d/c to SNF for further rehab to maximize independence, decrease fall risk, and decrease burden of care prior to returning home with wife.     Follow Up Recommendations SNF;Supervision/Assistance - 24 hour    Equipment Recommendations  None recommended by PT    Recommendations for Other Services       Precautions / Restrictions Precautions Precautions: Fall Restrictions Weight Bearing Restrictions: No Other Position/Activity Restrictions: chronic L AKA      Mobility  Bed Mobility Overal bed mobility: Needs Assistance Bed Mobility: Sidelying to Sit;Rolling Rolling: Min assist Sidelying to sit: Max assist          Transfers Overall transfer level: Needs assistance   Transfers: Squat Pivot Transfers     Squat pivot transfers: +2 physical assistance     General transfer comment: able to scoot towards EOB and demo forward weight shift, reaching for chair, and push through RLE but due to weakness unable to transfer without +2 assist  Ambulation/Gait                Stairs            Wheelchair Mobility    Modified Rankin (Stroke Patients Only)       Balance Overall balance assessment: Needs assistance Sitting-balance support: Single extremity supported Sitting balance-Leahy Scale: Poor                                        Pertinent Vitals/Pain Pain Assessment: No/denies pain    Home Living Family/patient expects to be discharged to:: Skilled nursing facility                      Prior Function Level of Independence: Needs assistance         Comments: Pt's wife reports following amputation in February pt was sent home with little f/u from therapy and declined in mobility until he was recently requiring heavy assist     Hand Dominance        Extremity/Trunk Assessment        Lower Extremity Assessment Lower Extremity Assessment: Generalized weakness       Communication   Communication: Other (comment)(cognition)  Cognition Arousal/Alertness: Lethargic Behavior During Therapy: Flat affect;Impulsive Overall Cognitive Status: Difficult to assess                                 General Comments: inconsistent following commands, requires multiple cues for mobility      General Comments      Exercises     Assessment/Plan    PT Assessment Patient needs continued PT services  PT Problem List Decreased strength;Decreased range of motion;Decreased activity tolerance;Decreased balance;Decreased mobility;Decreased coordination;Decreased cognition;Decreased knowledge of use of DME;Decreased safety awareness  PT Treatment Interventions DME instruction;Gait training;Functional mobility training;Therapeutic activities;Therapeutic exercise;Balance training;Patient/family education    PT Goals (Current goals can be found in the Care Plan section)  Acute Rehab PT Goals Patient Stated Goal: non stated PT Goal Formulation: Patient unable to participate in goal setting Time For Goal Achievement: 08/14/17 Potential to Achieve Goals: Fair    Frequency Min 2X/week   Barriers to discharge Decreased caregiver support pt's wife unable to care for pt at [facility] PT "6 Clicks" Daily Activity  Outcome Measure Difficulty  turning over in bed (including adjusting bedclothes, sheets and blankets)?: A Little Difficulty moving from lying on back to sitting on the side of the bed? : A Lot Difficulty sitting down on and standing up from a chair with arms (e.g., wheelchair, bedside commode, etc,.)?: Unable Help needed moving to and from a bed to chair (including a wheelchair)?: Total Help needed walking in hospital room?: Total Help needed climbing 3-5 steps with a railing? : Total 6 Click Score: 9    End of Session Equipment Utilized During Treatment: Gait belt;Oxygen Activity Tolerance: Patient tolerated treatment well Patient left: in chair;with call bell/phone within reach;with chair alarm set;with family/visitor present Nurse Communication: Mobility status;Need for lift equipment PT Visit Diagnosis: Muscle weakness (generalized) (M62.81);Difficulty in walking, not elsewhere classified (R26.2)    Time: 7289-7915 PT Time Calculation (min) (ACUTE ONLY): 37 min   Charges:   PT Evaluation $PT Eval Moderate Complexity: 1 Mod PT Treatments $Therapeutic Activity: 8-22 mins   PT G Codes:          Michel Santee 07/31/2017, 1:24 PM

## 2017-07-31 NOTE — Progress Notes (Signed)
Family Medicine Teaching Service Daily Progress Note Intern Pager: [phone]  [name] Medical record number: [id number] Date of birth: 10/28/1950 Age: 67 y.o. Gender: male  Primary Care Provider: [name], MD Consultants: CCM Code Status: DNR/DNI, BiPAP or NIPPV only  Pt Overview and Major Events to Date:  07/05/17 admitted for acute respiratory failure  07/07/17 transferred to ICU for resp distress 07/25/17 out to FPTS  Assessment and Plan: Leonard [name] is a 67 y.o. male who presented with SOB and developed acute respiratory failure. PMH is significant for COPD, CKD stage 4, CAD s/p CABG, T2DM, HTN, HLD.  Acute hypoxic respiratory failure with hypoxemia.  Improving.  Uncertain etiology.  Most likely a combination of CHF exacerbation as well as a steroid responsive pneumonitis.  Per CCM unknown specific pathology.  Does have a large right pleural effusion that may be contributing.  Patient is afebrile and WBC is wnl.  Repeat CXR on 6/26 showed no change in R sided pleural effusion so we are continuing to monitor with weekly CXR, next on 7/3.  Took in 1.7 ml, UOP 2 L in last 24 hours. - hold off on thoracentesis, will obtain CXR on 7/3 to monitor progress -s/p ceftaz (6/15-6/19) for HCAP, now monitoring off ABX -monitor fluid status, strict I/Os, daily wts -monitor on telemetry - wean O2 as tolerated - on 1L currently - continue Torsemide 20 mg PO daily  HTN. BP 148/59 this morning, stable. - monitor vitals - continue amlodipine 10mg  qd, Coreg 25mg  BID, clonidine 0.2 mg patch, doxazosin 8mg  qhs, hydralazine 100mg  q8h, isosorbide dinitrate 20mg  TID, minoxidil 5mg  QD - prn IV hydralazine for SBP>160 or DBP>100 - prn labetalol for SBP>130  CKD IV. Cr 1.73 on 6/27, appears to be at baseline ~2.5. Follows with Dr. Marval Regal and has been undergoing preparations to start HD. - monitor kidney function  Anemia of chronic disease. Hgb today 8.6, stable  through admission has been 7.9-7.2, although patient has received blood transfusions during admission when hemoglobin <7.0. - monitor CBC  T2DM. a1c 6.9. CBGs mid 100s over last 24 hours, likely d/t D5W and steroids.  On 28 units Lantus and rSSI. - watch sugars closely as patient just stopped steroids - rSSI - decrease Lantus to 28U qd - monitor CBGs  HLD. - continue atorvastatin  Hypothyroidism. TSH 3.338 - continue levothyroxine  Gout - continue allopurinol  Drowsiness Some clinic notes indicate that he would frequently fall asleep during clinic visits, but he is usually more alert than he has been lately.  Will stop any iatrogenic causes (trazodone, perhaps clonidine if blood pressure normalizes).  Normal ammonia and CO2 level on ABG on 6/27.  More alert on 6/27 - stop trazodone  FEN/GI: full liquid diet PPx: heparin SQ  Disposition: pending medical management  Subjective:  Says he feels "alright" and denies pain.  Objective: Temp:  [98.1 F (36.7 C)-99.4 F (37.4 C)] 98.1 F (36.7 C) (06/28 0500) Pulse Rate:  [71-77] 77 (06/28 0408) Resp:  [14-27] 14 (06/28 0408) BP: (134-165)/(50-66) 148/59 (06/28 0550) SpO2:  [92 %-97 %] 97 % (06/28 0408) Physical Exam: General: sitting up in bed, alert and oriented x 3 Cardiovascular: Regular rate and rhythm, normal S1-S2, no murmurs Respiratory: Normal effort on 1 L via nasal cannula, clear breath sounds, reduced air movement on R side, stable Abdomen: Soft, nondistended, nontender, positive bowel sounds Extremities: no LE edema, L AKA Neuro: alert and oriented x 3  Laboratory: Recent Labs  Lab 07/28/17 0631 07/29/17 0529 07/30/17 0442  WBC 9.4 6.2 4.8  HGB 7.9* 8.6* 7.8*  HCT 25.6* 28.7* 25.6*  PLT 199 174 164   Recent Labs  Lab 07/27/17 0524 07/28/17 0631 07/29/17 0529 07/30/17 0442  NA 150* 147* 146* 143  K 5.3* 4.4 4.4 4.3  CL 120* 118* 117* 115*  CO2 23 22 19* 21*  BUN 80* 84* 84* 78*  CREATININE 2.56*  2.31* 2.04* 1.73*  CALCIUM 8.5* 8.4* 8.2* 7.5*  PROT 5.2*  --   --   --   GLUCOSE 208* 181* 139* 105*   Mag 07/25/17: 2.5  Eosinophil count absolute 07/24/17: 0.0   Imaging/Diagnostic Tests: Dg Chest 1 View  Result Date: 07/29/2017 CLINICAL DATA:  Pleural effusion.  Short of breath. EXAM: CHEST  1 VIEW COMPARISON:  07/27/2017 FINDINGS: Bibasilar airspace disease unchanged. Right pleural effusion unchanged. Postop CABG. Feeding tube enters the stomach with the tip not visualized. IMPRESSION: Bibasilar airspace disease and right pleural effusion unchanged. Electronically Signed   By: Franchot Gallo M.D.   On: 07/29/2017 10:54   Dg Chest 2 View  Result Date: 07/27/2017 CLINICAL DATA:  Pleural effusion. EXAM: CHEST - 2 VIEW COMPARISON:  Chest x-rays dated 07/25/2017 and 03/21/2016 and chest CT dated 07/11/2017 FINDINGS: Feeding tube tip below the diaphragm. Large right pleural effusion is essentially unchanged considering different patient position. Pulmonary edema has significantly improved. Minimal left effusion. Heart size is normal. No acute bone abnormality. IMPRESSION: Pulmonary edema has significantly improved since the prior study with minimal residual edema. No significant change in the large right pleural effusion considering differences in position. Persistent small left effusion. Electronically Signed   By: Lorriane Shire M.D.   On: 07/27/2017 08:30   Dg Swallowing Func-speech Pathology  Result Date: 07/27/2017 Objective Swallowing Evaluation: Type of Study: MBS-Modified Barium Swallow Study  Patient Details Name: Leonard [name] MRN: [id number] Date of Birth: [birth year] Today's Date: 07/27/2017 Time: SLP Start Time (ACUTE ONLY): 1440 -SLP Stop Time (ACUTE ONLY): 1505 SLP Time Calculation (min) (ACUTE ONLY): 25 min Past Medical History: Past Medical History: Diagnosis Date . Cancer ([place])   skin cancer- . CHF (congestive heart failure) ([place])  . Chronic kidney disease   not on dialysis .  Coronary artery disease  . Gangrene of toe ([place])   right 5th/notes 01/04/2013  . High cholesterol  . Hypertension  . Iron deficiency anemia 08/03/2015 . 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 PAD (peripheral artery disease) ([place])  . Shortness of breath dyspnea   with exertion . Type II diabetes mellitus ([place])   Type II Past Surgical History: Past Surgical History: Procedure Laterality Date . ABDOMINAL AORTOGRAM W/LOWER EXTREMITY N/A 03/12/2017  Procedure: ABDOMINAL AORTOGRAM W/LOWER EXTREMITY;  Surgeon: Conrad Simpson, MD;  Location: [facility];  Service: Cardiovascular;  Laterality: N/A; . AMPUTATION Right 01/28/2013  Procedure: RAY AMPUTATION RIGHT  4th & 5th TOE;  Surgeon: Rosetta Posner, MD;  Location: [facility];  Service: Vascular;  Laterality: Right; . AMPUTATION Left 03/15/2017  Procedure: AMPUTATION ABOVE KNEE;  Surgeon: Waynetta Sandy, MD;  Location: [facility];  Service: Vascular;  Laterality: Left; . ANGIOPLASTY / STENTING FEMORAL Right 01/13/2013  superficial femoral artery x 2 (3 mm x 60 mm, 4 mm x 60 mm) . AV FISTULA PLACEMENT Left 02/11/2016  Procedure: LEFT UPPER ARM BRACHIAL CEPHALIC ARTERIOVENOUS (AV) FISTULA CREATION;  Surgeon: Conrad Nielsville, MD;  Location: [facility];  Service: Vascular;  Laterality: Left; .  COLONOSCOPY W/ POLYPECTOMY   . CORONARY ARTERY BYPASS GRAFT N/A 04/07/2013  Procedure: CORONARY ARTERY BYPASS GRAFTING (CABG);  Surgeon: Ivin Poot, MD;  Location: [facility];  Service: Open Heart Surgery;  Laterality: N/A; . INTRAOPERATIVE TRANSESOPHAGEAL ECHOCARDIOGRAM N/A 04/07/2013  Procedure: INTRAOPERATIVE TRANSESOPHAGEAL ECHOCARDIOGRAM;  Surgeon: Ivin Poot, MD;  Location: [facility];  Service: Open Heart Surgery;  Laterality: N/A; . LEFT HEART CATHETERIZATION WITH CORONARY ANGIOGRAM N/A 04/05/2013  Procedure: LEFT HEART CATHETERIZATION WITH CORONARY ANGIOGRAM;  Surgeon: Peter M Martinique, MD;  Location[facility] C[facility] LAB;  Service: Cardiovascular;  Laterality: N/A; . LOWER  EXTREMITY ANGIOGRAM Bilateral 01/06/2013  Procedure: LOWER EXTREMITY ANGIOGRAM;  Surgeon: Conrad Silt, MD;  Location: [facility] CATH LAB;  Service: Cardiovascular;  Laterality: Bilateral; . LOWER EXTREMITY ANGIOGRAM Right 01/13/2013  Procedure: LOWER EXTREMITY ANGIOGRAM;  Surgeon: Conrad , MD;  Location: [facility] CATH LAB;  Service: Cardiovascular;  Laterality: Right; . SKIN CANCER EXCISION  2017 . THROAT SURGERY  1983  polyps . TONSILLECTOMY AND ADENOIDECTOMY  ~ 1965 HPI: HPI: 67 year old man with a history of diabetes, peripheral vascular disease, hypertension and chronic kidney disease.  He was admitted with orthopnea that progressed quickly to fulminant respiratory failure.  He required mechanical ventilation 6/5 and for a period of time was on high [name], neuromuscular blockade.  He has been aggressively diuresed and blood pressure tightly controlled.  He self extubated and 6/12 and has tolerated. Pulled Cortrak.  No data recorded Assessment / Plan / Recommendation CHL IP CLINICAL IMPRESSIONS 07/27/2017 Clinical Impression Pt presenting with a moderate oropharyngeal dysphagia which appears improved in comparison to previous MBS. Oral phase characterized by lingual incoordination especially with cup sips, along with piecemeal swallowing and premature spillage, decreased base of tongue retraction and silent penetration to the level of the vocal folds with cup sip of thin liquid; cued cough somewhat effective in clearing residuals. With sips by straw, oral coordination is improved and while pt continues to have premature spillage to the pyriform sinuses (bolus sits in pyriforms for ~1 second prior to swallow initiation), pt consistently demonstrated good airway protection and no penetration/ aspiration by straw. With puree consistency, pt had delayed and effortful oral transit but good airway protection and trace residuals in the vallecula/ pyriform sinuses. The greatest concern is the pt's impulsivity as he needed verbal/  tactile cues to take small sips/ 1 sip at a time by straw. Recommend cautiously initiating full liquid diet, meds crushed in puree, full supervision- only feed when alert, cue small sips- 1 sip at a time. SLP will continue to follow closely for diet tolerance/ consider advancement as mentation improves. SLP Visit Diagnosis Dysphagia, oropharyngeal phase (R13.12) Attention and concentration deficit following -- Frontal lobe and executive function deficit following -- Impact on safety and function Moderate aspiration risk   CHL IP TREATMENT RECOMMENDATION 07/27/2017 Treatment Recommendations Therapy as outlined in treatment plan below   Prognosis 07/27/2017 Prognosis for Safe Diet Advancement Good Barriers to Reach Goals Cognitive deficits;Time post onset Barriers/Prognosis Comment -- CHL IP DIET RECOMMENDATION 07/27/2017 SLP Diet Recommendations Thin liquid;Other (Comment) Liquid Administration via Straw Medication Administration Crushed with puree Compensations Minimize environmental distractions;Slow rate;Small sips/bites Postural Changes Seated upright at 90 degrees   CHL IP OTHER RECOMMENDATIONS 07/27/2017 Recommended Consults -- Oral Care Recommendations Oral care BID Other Recommendations --   CHL IP FOLLOW UP RECOMMENDATIONS 07/27/2017 Follow up Recommendations Skilled Nursing facility   [facility] IP FREQUENCY AND DURATION 07/27/2017 Speech Therapy Frequency (ACUTE ONLY) min 2x/week Treatment Duration  2 weeks      CHL IP ORAL PHASE 07/27/2017 Oral Phase Impaired Oral - Pudding Teaspoon -- Oral - Pudding Cup -- Oral - Honey Teaspoon -- Oral - Honey Cup -- Oral - Nectar Teaspoon -- Oral - Nectar Cup -- Oral - Nectar Straw -- Oral - Thin Teaspoon -- Oral - Thin Cup Reduced posterior propulsion;Piecemeal swallowing;Delayed oral transit;Premature spillage Oral - Thin Straw Reduced posterior propulsion;Piecemeal swallowing;Delayed oral transit;Premature spillage Oral - Puree Reduced posterior propulsion;Delayed oral transit Oral  - Mech Soft -- Oral - Regular -- Oral - Multi-Consistency -- Oral - Pill -- Oral Phase - Comment --  CHL IP PHARYNGEAL PHASE 07/27/2017 Pharyngeal Phase Impaired Pharyngeal- Pudding Teaspoon -- Pharyngeal -- Pharyngeal- Pudding Cup -- Pharyngeal -- Pharyngeal- Honey Teaspoon -- Pharyngeal -- Pharyngeal- Honey Cup -- Pharyngeal -- Pharyngeal- Nectar Teaspoon -- Pharyngeal -- Pharyngeal- Nectar Cup -- Pharyngeal -- Pharyngeal- Nectar Straw -- Pharyngeal -- Pharyngeal- Thin Teaspoon -- Pharyngeal -- Pharyngeal- Thin Cup Delayed swallow initiation-pyriform sinuses;Reduced tongue base retraction;Penetration/Aspiration before swallow Pharyngeal Material enters airway, CONTACTS cords and not ejected out Pharyngeal- Thin Straw Delayed swallow initiation-pyriform sinuses;Reduced tongue base retraction Pharyngeal -- Pharyngeal- Puree Pharyngeal residue - valleculae Pharyngeal -- Pharyngeal- Mechanical Soft -- Pharyngeal -- Pharyngeal- Regular -- Pharyngeal -- Pharyngeal- Multi-consistency -- Pharyngeal -- Pharyngeal- Pill -- Pharyngeal -- Pharyngeal Comment --  CHL IP CERVICAL ESOPHAGEAL PHASE 07/27/2017 Cervical Esophageal Phase WFL Pudding Teaspoon -- Pudding Cup -- Honey Teaspoon -- Honey Cup -- Nectar Teaspoon -- Nectar Cup -- Nectar Straw -- Thin Teaspoon -- Thin Cup -- Thin Straw -- Puree -- Mechanical Soft -- Regular -- Multi-consistency -- Pill -- Cervical Esophageal Comment -- No flowsheet data found. Kern Reap, MA, CCC-SLP 07/27/2017, 3:40 PM [id number]               Kathrene Alu, MD 07/31/2017, 6:52 AM PGY-1, [place] Intern pager: [phone], text pages welcome

## 2017-07-31 NOTE — Progress Notes (Signed)
Due to patient being intermittently confused, CSW updated patient's HCPOA, Karie Kirks, that CSW would be assisting in patient's transition back to [facility].    Percell Locus Derek Laughter LCSW [phone]

## 2017-08-01 LAB — GLUCOSE, CAPILLARY
GLUCOSE-CAPILLARY: 241 mg/dL — AB (ref 70–99)
GLUCOSE-CAPILLARY: 227 mg/dL — AB (ref 70–99)
Glucose-Capillary: 144 mg/dL — ABNORMAL HIGH (ref 70–99)
Glucose-Capillary: 156 mg/dL — ABNORMAL HIGH (ref 70–99)

## 2017-08-01 MED ORDER — POLYETHYLENE GLYCOL 3350 17 G PO PACK: 17.0000 g | PACK | Freq: Every day | ORAL | Status: DC | PRN

## 2017-08-01 MED ORDER — POLYETHYLENE GLYCOL 3350 17 G PO PACK: 17.0000 g | PACK | Freq: Every day | ORAL | Status: DC

## 2017-08-01 NOTE — Progress Notes (Signed)
Family Medicine Teaching Service Daily Progress Note Intern Pager: [phone]  [name] Medical record number: [id number] Date of birth: 1950-10-03 Age: 67 y.o. Gender: male  Primary Care Provider: [name], MD Consultants: CCM Code Status: DNR/DNI, BiPAP or NIPPV only  Pt Overview and Major Events to Date:  07/05/17 admitted for acute respiratory failure  07/07/17 transferred to ICU for resp distress 07/25/17 out to [facility] 6/22-6/29 weaning down O2, discontinued NG tube, advancing diet  Assessment and Plan: Leonard [name] is a 67 y.o. male who presented with SOB and developed acute respiratory failure. PMH is significant for COPD, CKD stage 4, CAD s/p CABG, T2DM, HTN, HLD.  Acute hypoxic respiratory failure with hypoxemia, improving.  Uncertain etiology.  Most likely a combination of CHF exacerbation as well as a steroid responsive pneumonitis.  Per CCM unknown specific pathology.  Does have a large right pleural effusion that may be contributing.  Patient is afebrile and WBC is wnl.  Repeat CXR on 6/26 showed no change in R sided pleural effusion so we are continuing to monitor with weekly CXR, next on 7/3.  Took in 1.2 L, UOP 1 L in last 24 hours. - hold off on thoracentesis, will obtain CXR on 7/3 to monitor progress -s/p ceftaz (6/15-6/19) for HCAP, now monitoring off ABX -monitor fluid status, strict I/Os, daily wts -monitor on telemetry - wean O2 as tolerated - on 1L currently - continue Torsemide 20 mg PO daily - diet advanced to dysphagia 2 on 6/28  HTN. BP 138/56 this morning, stable. - monitor vitals - continue amlodipine 10mg  qd, Coreg 25mg  BID, clonidine 0.2 mg patch, doxazosin 8mg  qhs, hydralazine 100mg  q8h, isosorbide dinitrate 20mg  TID, minoxidil 5mg  QD - prn IV hydralazine for SBP>160 or DBP>100  CKD IV. Cr 1.73 on 6/27, appears to be at baseline ~2.5. Follows with Dr. Marval Regal and has been undergoing preparations to start HD. - monitor  kidney function  Anemia of chronic disease. Hgb stable over last week, has been 7.9-7.2, although patient has received blood transfusions during admission when hemoglobin <7.0.  T2DM. a1c 6.9. CBGs mid 100s over last 24 hours, likely d/t D5W and steroids.  On 24 units Lantus and rSSI. - watch sugars closely as patient just stopped steroids - rSSI - continue Lantus 24U qd - monitor CBGs  HLD. - continue atorvastatin  Hypothyroidism. TSH 3.338 - continue levothyroxine  Gout - continue allopurinol  Drowsiness, improved  Avoid sedating medications. - trazodone discontinued  FEN/GI: dysphagia 2 PPx: heparin SQ  Disposition: pending medical management  Subjective:  Says he wants water and that his lower abdomen hurts.  Objective: Temp:  [98.5 F (36.9 C)-98.8 F (37.1 C)] 98.8 F (37.1 C) (06/29 0451) Pulse Rate:  [72-76] 72 (06/28 1743) Resp:  [20] 20 (06/28 1340) BP: (124-149)/(53-62) 138/56 (06/29 0817) SpO2:  [98 %-99 %] 98 % (06/28 1743) Weight:  [182 lb 5.1 oz (82.7 kg)] 182 lb 5.1 oz (82.7 kg) (06/29 0142) Physical Exam: General: lying in bed, alert and oriented x 3.  On 5L O2 satting 100%, I turned this back down to 1 L with continued normal saturations. Cardiovascular: Regular rate and rhythm, normal S1-S2, no murmurs Respiratory: Normal effort on 1 L via nasal cannula, clear breath sounds, reduced air movement on R side, stable Abdomen: Soft, nondistended, nontender in lower abdomen, positive bowel sounds, some liquid stool on bed Extremities: no LE edema, L AKA Neuro: alert and oriented x 3  Laboratory: Recent  Labs  Lab 07/28/17 0631 07/29/17 0529 07/30/17 0442  WBC 9.4 6.2 4.8  HGB 7.9* 8.6* 7.8*  HCT 25.6* 28.7* 25.6*  PLT 199 174 164   Recent Labs  Lab 07/27/17 0524 07/28/17 0631 07/29/17 0529 07/30/17 0442  NA 150* 147* 146* 143  K 5.3* 4.4 4.4 4.3  CL 120* 118* 117* 115*  CO2 23 22 19* 21*  BUN 80* 84* 84* 78*  CREATININE 2.56* 2.31*  2.04* 1.73*  CALCIUM 8.5* 8.4* 8.2* 7.5*  PROT 5.2*  --   --   --   GLUCOSE 208* 181* 139* 105*   Mag 07/25/17: 2.5  Eosinophil count absolute 07/24/17: 0.0   Imaging/Diagnostic Tests: Dg Chest 1 View  Result Date: 07/29/2017 CLINICAL DATA:  Pleural effusion.  Short of breath. EXAM: CHEST  1 VIEW COMPARISON:  07/27/2017 FINDINGS: Bibasilar airspace disease unchanged. Right pleural effusion unchanged. Postop CABG. Feeding tube enters the stomach with the tip not visualized. IMPRESSION: Bibasilar airspace disease and right pleural effusion unchanged. Electronically Signed   By: Franchot Gallo M.D.   On: 07/29/2017 10:54    Kathrene Alu, MD 08/01/2017, 8:53 AM PGY-1, [place] Intern pager: [phone], text pages welcome

## 2017-08-01 NOTE — Discharge Summary (Signed)
[facility] Discharge Summary  [name] Medical record number: [id number] Date of birth: 1951/01/01 Age: 67 y.o. Gender: male Date of Admission: 07/05/2017  Date of Discharge: 08/07/2017 Admitting Physician: Kinnie Feil, MD  Primary Care Provider: Kathrene Alu, MD Consultants: CCM  Indication for Hospitalization: acute on chronic respiratory failure  Discharge Diagnoses/Problem List:  COPD CHF HTN CKD IV Anemia of chronic disease T2DM HLD Hypothyroidism Gout  Disposition: SNF  Discharge Condition: Stable   Discharge Exam:  Physical Exam: General: 67yo male in NAD, sitting up in bed HEENT: NCAT Cardio: RRR no m/r/g Lungs: mild bibasilar crackles R>L, normal work of breathing Abdomen: soft, non-tender to palpation, + bowel sounds MSK: No edema, L AKA Skin: Warm and dry    Brief Hospital Course:  Leonard [name] was admitted on 07/05/17 with acute hypoxic respiratory failure likely due to CHF.  He continued to deteriorate while on continuous BiPAP, so he was transferred to the ICU on 6/4.  He was then intubated and treated for possible HCAP with vancomycin and zosyn until 6/9.  He was also transfused three units of pRBCs from 6/5-6/7 for hemoglobin below 7.0 (patient has anemia of chronic disease.)  He continued to need diuresis and was given lasix and eventually put on a lasix drip.  Patient self-extubated on 6/12 but was able to tolerate this extubation, with his oxygenation supported with HFNC and PRN BiPAP.  CXR shows bibasilar consolidations and continued pulmonary edema on 6/15, so patient was started on ceftazidime for possible aspiration pneumonia (patient was on NG feeds at the time).  When his oxygenation continued to deteriorate, he was started on empiric steroids on 3/26 for eosinophilic pneumonia vs pneumonitis.    Once steroids were started, his hypoxemia began to improve, and he was rapidly weaned down on his oxygen  requirement.  Ceftazidime and Lasix were discontinued on 6/21, and he was transferred out of the ICU on 6/22.  Steroids were continued until 6/25.  After Leonard [name] NG tube was removed on 6/27, and his PO intake improved daily.  Het continued to have a R-sided pleural effusion, but pulmonology recommended against thoracentesis.  Torsemide 20 mg daily was started on 6/27 to reduce the pleural effusion. He was also transfused 1 unit pRBC on 7/1 for Hgb of 7.3, as he has a history of CAD and threshold for transfusion should be <8 (anemia of chronic disease).  Repeat CXR was ordered for follow up of right sided pleural effusion on 7/2, revealing bilateral pleural effusions and questionable nodule in the left mid lung, at this time patient was also experiencing increased shortness of breath while lying flat and IV Lasix was given on 7/2, 7/3, and twice on 7/4.  Patient diuresed well and SOB did not occur again.  Patient maintained O2 sats at an appropriate level throughout the rest of his stay.  Due to somnolence, CT head was ordered, but revealed no acute pathology.  After sustained improvement in PO intake, mental status, and oxygen requirement, he was discharged back to his long term care facility on 2L.  Issues for Follow Up:  1. Monitor Hgb, as was 7.7 at discharge, stable for two days and patient asymptomatic. Started on ASA 41m QD. Eliquis discontinued due to history of only 1 DVT with adequate treatment. 2. Recommend monitoring BMP for kidney function. 3. Consider outpatient sleep study due to somnolence. 5. Recommend repeat chest x-ray in 1 week for follow up of pleural  effusions and questionable left mid lung nodule. 5. O2 to be titrated as appropriate, goal O2 sats are 88-92% due to COPD.  Was prescribed 2L for use at SNF.   Significant Procedures: intubation, 4 blood transfusions  Significant Labs and Imaging:  Recent Labs  Lab 08/05/17 0915 08/06/17 0441 08/07/17 0542  WBC 5.9 5.3 5.8   HGB 8.4* 7.7* 7.7*  HCT 27.1* 25.0* 25.1*  PLT 149* 152 160   Recent Labs  Lab 08/03/17 0509 08/04/17 0014 08/05/17 0915 08/06/17 0441 08/07/17 0542  NA 136 137 139 138 139  K 4.7 4.7 4.6 4.4 4.1  CL 108 107 108 110 107  CO2 21* 19* 21* 23 25  GLUCOSE 85 154* 155* 95 102*  BUN 56* 56* 51* 51* 49*  CREATININE 1.79* 1.81* 1.69* 1.67* 1.64*  CALCIUM 7.9* 8.1* 8.3* 8.0* 8.2*   Imaging/Diagnostic Tests: Dg Chest 2 View  Result Date: 08/04/2017 CLINICAL DATA:  History of pleural effusion, follow-up EXAM: CHEST - 2 VIEW COMPARISON:  Portable chest x-ray of 07/29/2017, and CT chest of 07/11/2017 FINDINGS: The lungs are very poorly aerated. There are bilateral pleural effusions present with volume loss at the lung bases and cardiomegaly. There does appear to be a degree of pulmonary vascular congestion, findings consistent with CHF. There is a questionable nodule in left mid lung on this portable film. I did review the CT chest of 07/11/2017 and do not see a nodule although at that time there was considerable volume loss with left effusion present which could obscure a nodule. Attention to this area on follow-up chest x-rays is recommended. IMPRESSION: 1. Poor aeration with moderate-sized bilateral pleural effusions and probable CHF. 2. Questionable nodule in the left mid lung not definitely seen on CT chest 07/11/2017. Recommend attention to this area on follow-up chest x-rays. Electronically Signed   By: Ivar Drape M.D.   On: 08/04/2017 10:05    Ct Head Wo Contrast  Result Date: 08/05/2017 CLINICAL DATA:  67 y/o  M; concerns for somnolence. EXAM: CT HEAD WITHOUT CONTRAST TECHNIQUE: Contiguous axial images were obtained from the base of the skull through the vertex without intravenous contrast. COMPARISON:  07/18/2017 CT head. FINDINGS: Brain: No evidence of acute infarction, hemorrhage, hydrocephalus, extra-axial collection or mass lesion/mass effect. Stable chronic microvascular ischemic  changes and parenchymal volume loss of the brain. 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 Vascular: Calcific atherosclerosis of carotid siphons and the vertebral arteries. Skull: Normal. Negative for fracture or focal lesion. Sinuses/Orbits: Stable trace bilateral mastoid effusions. Left sphenoid sinus opacification. Bilateral intra-ocular lens replacement. Other: None. IMPRESSION: 1. No acute intracranial abnormality identified. 2. Stable chronic microvascular ischemic changes and parenchymal volume loss of the brain. 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 Electronically Signed   By: Kristine Garbe M.D.   On: 08/05/2017 01:28   Dg Chest 1 View  Result Date: 07/29/2017 CLINICAL DATA:  Pleural effusion.  Short of breath. EXAM: CHEST  1 VIEW COMPARISON:  07/27/2017 FINDINGS: Bibasilar airspace disease unchanged. Right pleural effusion unchanged. Postop CABG. Feeding tube enters the stomach with the tip not visualized. IMPRESSION: Bibasilar airspace disease and right pleural effusion unchanged. Electronically Signed   By: Franchot Gallo M.D.   On: 07/29/2017 10:54   CT Chest WO Contrast CLINICAL DATA:  Respiratory decline, acute respiratory illness, intubated  EXAM: CT CHEST WITHOUT CONTRAST  TECHNIQUE: Multidetector CT imaging of the chest was performed following the standard protocol without IV contrast.  COMPARISON:  07/11/2017  FINDINGS: Cardiovascular: Mild cardiomegaly. No pericardial effusion. Postop changes of coronary bypass. Extensive atherosclerosis of the major branch vessels, thoracic aorta and native coronary vasculature. Exam is limited without contrast.  Mediastinum/Nodes: Feeding tube within the esophagus extending into the stomach. Tube is coiled in the stomach. Prominent scattered mediastinal and suspect hilar lymph nodes likely reactive. Limited assessment without contrast.  Lungs/Pleura: Respiratory  motion artifact noted. Vascular congestion and central interstitial edema pattern noted. Dense consolidation/collapse of both lower lobes but also airspace disease and consolidation in the dependent upper lobes, lingula and right middle lobe inferiorly. Pneumonia and/or aspiration could have this appearance. Pleural effusions noted bilaterally, larger on the right.  Upper Abdomen: Limited without contrast. Splenic granulomata noted. Feeding tube coiled in the stomach.  Musculoskeletal: No acute osseous finding.  IMPRESSION: Extensive bilateral airspace opacities dependently in the upper lobes but also in the lingula and right middle lobe. Dense bibasilar collapse/consolidation. Pneumonia and/or aspiration is favored with a component of mild superimposed interstitial edema centrally.  Associated pleural effusions, larger on the right  Mild cardiomegaly without pericardial effusion  Limited without IV contrast  Aortic Atherosclerosis (ICD10-I70.0). Electronically Signed   By: Jerilynn Mages.  Shick M.D.   On: 07/11/2017 11:16   Results/Tests Pending at Time of Discharge: None  Discharge Medications:  Allergies as of 08/07/2017   No Known Allergies     Medication List    STOP taking these medications   cloNIDine 0.2 MG tablet Commonly known as:  CATAPRES   ELIQUIS 2.5 MG Tabs tablet Generic drug:  apixaban   oxyCODONE 5 MG immediate release tablet Commonly known as:  Oxy IR/ROXICODONE   potassium chloride SA 20 MEQ tablet Commonly known as:  K-DUR,KLOR-CON   predniSONE 20 MG tablet Commonly known as:  DELTASONE   traMADol 50 MG tablet Commonly known as:  ULTRAM     TAKE these medications   allopurinol 100 MG tablet Commonly known as:  ZYLOPRIM Take 100 mg by mouth daily.   amLODipine 10 MG tablet Commonly known as:  NORVASC Take 1 tablet (10 mg total) by mouth daily. Needs office visit for further refills   aspirin EC 81 MG tablet Take 1 tablet (81 mg total)  by mouth daily.   atorvastatin 40 MG tablet Commonly known as:  LIPITOR TAKE ONE (1) TABLET BY MOUTH EVERY DAY AT 6:00PM What changed:  See the new instructions.   carvedilol 25 MG tablet Commonly known as:  COREG TAKE TWO (2) TABLETS BY MOUTH 2 TIMES DAILY WITH A MEAL What changed:  See the new instructions.   cloNIDine 0.2 mg/24hr patch Commonly known as:  CATAPRES - Dosed in mg/24 hr Place 1 patch (0.2 mg total) onto the skin once a week. Start taking on:  08/09/2017   doxazosin 8 MG tablet Commonly known as:  CARDURA Take 1 tablet (8 mg total) by mouth every evening. What changed:  when to take this   feeding supplement (ENSURE ENLIVE) Liqd Take 237 mLs by mouth 3 (three) times daily between meals.   FEROSUL 325 (65 FE) MG tablet Generic drug:  ferrous sulfate TAKE ONE (1) TABLET BY MOUTH TWO (2) TIMES DAILY WITH A MEAL What changed:  See the new instructions.   glucose blood test strip Commonly known as:  ONETOUCH VERIO Use as instructed to test three times daily. ICD-10 code: E11.22   hydrALAZINE 100 MG tablet Commonly known as:  APRESOLINE TAKE ONE TABLET BY MOUTH EVERY EIGHT HOURS What changed:  See the new instructions.   insulin aspart 100 UNIT/ML FlexPen Commonly known as:  NOVOLOG FLEXPEN Inject 15 Units 2 (two) times daily before lunch and supper into the skin.   insulin glargine 100 UNIT/ML injection Commonly known as:  LANTUS Inject 0.24 mLs (24 Units total) into the skin daily. Start taking on:  08/08/2017   ipratropium-albuterol 0.5-2.5 (3) MG/3ML Soln Commonly known as:  DUONEB Take 3 mLs by nebulization 4 (four) times daily.   isosorbide mononitrate 60 MG 24 hr tablet Commonly known as:  IMDUR TAKE ONE (1) TABLET BY MOUTH EVERY DAY What changed:  See the new instructions.   levothyroxine 100 MCG tablet Commonly known as:  SYNTHROID, LEVOTHROID Take 100 mcg by mouth daily before breakfast.   minoxidil 10 MG tablet Commonly known as:   LONITEN Take 0.5 tablets (5 mg total) by mouth daily. Start taking on:  08/08/2017   nitroGLYCERIN 0.4 MG SL tablet Commonly known as:  NITROSTAT Place 1 tablet (0.4 mg total) under the tongue every 5 (five) minutes as needed for chest pain.   ONETOUCH DELICA LANCETS 40X Misc 1 each by Does not apply route 3 (three) times daily. ICD-10 code: E11.22   [facility] VERIO FLEX SYSTEM w/Device Kit 1 kit by Does not apply route daily.   pantoprazole 40 MG tablet Commonly known as:  PROTONIX TAKE ONE (1) TABLET BY MOUTH EVERY DAY What changed:  See the new instructions.   polyethylene glycol packet Commonly known as:  MIRALAX / GLYCOLAX Take 17 g by mouth daily as needed for mild constipation or moderate constipation.   torsemide 20 MG tablet Commonly known as:  DEMADEX Take 1 tablet (20 mg total) by mouth daily. Start taking on:  08/08/2017 What changed:  when to take this            Durable Medical Equipment  (From admission, onward)        Start     Ordered   08/07/17 0000  For home use only DME oxygen    Comments:  Goal O2 sats 88-92  Question Answer Comment  Mode or (Route) Nasal cannula   Liters per Minute 2   Frequency Continuous (stationary and portable oxygen unit needed)   Oxygen delivery system Gas      08/07/17 1353      Discharge Instructions: Please refer to Patient Instructions section of EMR for full details.  Patient was counseled important signs and symptoms that should prompt return to medical care, changes in medications, dietary instructions, activity restrictions, and follow up appointments.   Follow-Up Appointments: Contact information for after-discharge care    Destination    [facility] .   Service:  Skilled Nursing Contact information: [place], [place], DO 08/07/2017, 1:54 PM PGY-1, [place]

## 2017-08-02 LAB — GLUCOSE, CAPILLARY
GLUCOSE-CAPILLARY: 205 mg/dL — AB (ref 70–99)
Glucose-Capillary: 147 mg/dL — ABNORMAL HIGH (ref 70–99)
Glucose-Capillary: 148 mg/dL — ABNORMAL HIGH (ref 70–99)

## 2017-08-02 NOTE — Progress Notes (Signed)
Family Medicine Teaching Service Daily Progress Note Intern Pager: [phone]  [name] Medical record number: [id number] Date of birth: April 13, 1950 Age: 67 y.o. Gender: male  Primary Care Provider: [name], MD Consultants: CCM Code Status: DNR/DNI, BiPAP or NIPPV only    Pt Overview and Major Events to Date:  07/05/17 admitted for acute respiratory failure  07/07/17 transferred to ICU for resp distress 07/25/17 out to [facility] 6/22-6/29 weaning down O2, discontinued NG tube, advancing diet   Assessment and Plan: Leonard [name] is a 67 y.o. male who presented with SOB and developed acute respiratory failure. PMH is significant for COPD, CKD stage 4, CAD s/p CABG, T2DM, HTN, HLD.  Acute hypoxic respiratory failure, improving.  Likely 2/2 CHF exacerbation and steroid responsive pneumonitis of unknown etiology.  Per CCM unknown specific pathology.  Does have a large right pleural effusion that may be contributing.  Patient has remained afebrile and WBC is wnl.  Repeat CXR on 6/26 showed no change in R sided pleural effusion so we are continuing to monitor with weekly CXR, next on 7/3.  Took in 600 cc, UOP 550 cc in last 24 hours. On 3L per [place] currently, does not have home O2 requirement.  - hold off on thoracentesis, will obtain CXR on 7/3 to monitor progress.  -s/p ceftaz (6/15-6/19) for HCAP, now monitoring off ABX -monitor fluid status, strict I/Os, daily wts -monitor on telemetry - wean O2 as tolerated - on 3L currently  - continue Torsemide 20 mg PO daily  HTN. BP 144/58 this morning, stable. - continue amlodipine 10mg  qd, Coreg 25mg  BID, clonidine 0.2 mg patch, doxazosin 8mg  qhs, hydralazine 100mg  q8h, isosorbide dinitrate 20mg  TID, minoxidil 5mg  QD - prn IV hydralazine for SBP>160 or DBP>100  CKD IV. Cr 1.73 on 6/27, appears to be at baseline ~2.5. Follows with Dr. Marval Regal and has been undergoing preparations to start HD. Improved to 1.73 this AM.   -  monitor kidney function  Anemia of chronic disease. Hgb stable over last week, has been 7.9-7.2, although patient has received blood transfusions during admission when hemoglobin <7.0.  T2DM. a1c 6.9.  On 24 units Lantus and rSSI. - rSSI - continue Lantus 24U qd - monitor CBGs  HLD. - continue atorvastatin  Hypothyroidism. TSH 3.338 - continue levothyroxine  Gout - continue allopurinol  Drowsiness, improved  Avoid sedating medications.  - trazodone discontinued  FEN/GI: dysphagia 2 PPx: heparin SQ  Disposition: pending clinical improvement   Subjective:  Pt not very cooperative this morning.  Niece in room with him.  Denies SOB, CP, leg swelling.  No acute overnight events.   Objective: Temp:  [98 F (36.7 C)-99.5 F (37.5 C)] 99 F (37.2 C) (06/30 0804) Pulse Rate:  [71-76] 76 (06/30 1034) Resp:  [14-28] 24 (06/30 1034) BP: (131-148)/(58-61) 144/58 (06/30 0804) SpO2:  [84 %-92 %] 90 % (06/30 1034) Weight:  [182 lb 12.2 oz (82.9 kg)] 182 lb 12.2 oz (82.9 kg) (06/30 0348)   Physical Exam: General: 67 yo male, lying in bed, [place] in place Cardiovascular: RRR no MRG  Respiratory: Normal effort, clear lungs on exam  Abdomen: Soft, NTND, +bs  Extremities: no LE edema, L AKA Neuro: alert and oriented x 3  Laboratory: Recent Labs  Lab 07/28/17 0631 07/29/17 0529 07/30/17 0442  WBC 9.4 6.2 4.8  HGB 7.9* 8.6* 7.8*  HCT 25.6* 28.7* 25.6*  PLT 199 174 164   Recent Labs  Lab 07/27/17 0524 07/28/17 0631  07/29/17 0529 07/30/17 0442  NA 150* 147* 146* 143  K 5.3* 4.4 4.4 4.3  CL 120* 118* 117* 115*  CO2 23 22 19* 21*  BUN 80* 84* 84* 78*  CREATININE 2.56* 2.31* 2.04* 1.73*  CALCIUM 8.5* 8.4* 8.2* 7.5*  PROT 5.2*  --   --   --   GLUCOSE 208* 181* 139* 105*   Mag 07/25/17: 2.5  Eosinophil count absolute 07/24/17: 0.0   Imaging/Diagnostic Tests: No results found.  Lovenia Kim, MD PGY-2, [facility]

## 2017-08-03 LAB — BASIC METABOLIC PANEL
Anion gap: 7 (ref 5–15)
BUN: 56 mg/dL — AB (ref 8–23)
CO2: 21 mmol/L — ABNORMAL LOW (ref 22–32)
CREATININE: 1.79 mg/dL — AB (ref 0.61–1.24)
Calcium: 7.9 mg/dL — ABNORMAL LOW (ref 8.9–10.3)
Chloride: 108 mmol/L (ref 98–111)
GFR calc Af Amer: 44 mL/min — ABNORMAL LOW (ref >60)
GFR, EST NON AFRICAN AMERICAN: 38 mL/min — AB (ref >60)
GLUCOSE: 85 mg/dL (ref 70–99)
POTASSIUM: 4.7 mmol/L (ref 3.5–5.1)
Sodium: 136 mmol/L (ref 135–145)

## 2017-08-03 LAB — GLUCOSE, CAPILLARY
GLUCOSE-CAPILLARY: 126 mg/dL — AB (ref 70–99)
Glucose-Capillary: 197 mg/dL — ABNORMAL HIGH (ref 70–99)
Glucose-Capillary: 117 mg/dL — ABNORMAL HIGH (ref 70–99)
Glucose-Capillary: 254 mg/dL — ABNORMAL HIGH (ref 70–99)
Glucose-Capillary: 149 mg/dL — ABNORMAL HIGH (ref 70–99)

## 2017-08-03 LAB — CBC
HCT: 23.9 % — ABNORMAL LOW (ref 39.0–52.0)
Hemoglobin: 7.3 g/dL — ABNORMAL LOW (ref 13.0–17.0)
MCH: 27.1 pg (ref 26.0–34.0)
MCHC: 30.5 g/dL (ref 30.0–36.0)
MCV: 88.8 fL (ref 78.0–100.0)
PLATELETS: 123 10*3/uL — AB (ref 150–400)
RBC: 2.69 MIL/uL — AB (ref 4.22–5.81)
RDW: 18.5 % — ABNORMAL HIGH (ref 11.5–15.5)
WBC: 9 10*3/uL (ref 4.0–10.5)

## 2017-08-03 LAB — PREPARE RBC (CROSSMATCH)

## 2017-08-03 MED ORDER — FUROSEMIDE 10 MG/ML IJ SOLN
40.0000 mg | Freq: Once | INTRAMUSCULAR | Status: AC
  Administered 2017-08-03: 40 mg via INTRAVENOUS
  Filled 2017-08-03: qty 4

## 2017-08-03 MED ORDER — SODIUM CHLORIDE 0.9% IV SOLUTION
Freq: Once | INTRAVENOUS | Status: AC
  Administered 2017-08-03: 17:00:00 via INTRAVENOUS

## 2017-08-03 NOTE — Progress Notes (Signed)
Patient pulled out IV while blood was transfusing. IV found out at 1720. RN attempted IV placement and failed. IV team consulted. New Ultra sound guided IV placed and blood restarted at [place]

## 2017-08-03 NOTE — Progress Notes (Signed)
  Speech Language Pathology Treatment: Dysphagia  Patient Details Name: Leonard [name] MRN: [id number] DOB: [birth year] Today's Date: 08/03/2017 Time: 1400-1409 SLP Time Calculation (min) (ACUTE ONLY): 9 min  Assessment / Plan / Recommendation Clinical Impression  SLP repositioned pt to try to maximize arousal and safe positioning for intake, although he still remains moderately drowsy. He was eager for water and initial bites of cracker, needing Mod cues due to impulsivity and mildly reduced  oral clearance. Additional trials were held as pt could not sustain his alertness. Would continue with current diet (Dys 2 textures, thin liquids) only when fully alert.   HPI HPI: HPI: 67 year old man with a history of diabetes, peripheral vascular disease, hypertension and chronic kidney disease.  He was admitted with orthopnea that progressed quickly to fulminant respiratory failure.  He required mechanical ventilation 6/5 and for a period of time was on high [name], neuromuscular blockade.  He has been aggressively diuresed and blood pressure tightly controlled.  He self extubated and 6/12 and has tolerated. Pulled Cortrak.       SLP Plan  Continue with current plan of care       Recommendations  Diet recommendations: Dysphagia 2 (fine chop);Thin liquid Liquids provided via: Straw Medication Administration: Whole meds with puree Supervision: Patient able to self feed;Full supervision/cueing for compensatory strategies Compensations: Minimize environmental distractions;Slow rate;Small sips/bites Postural Changes and/or Swallow Maneuvers: Seated upright 90 degrees                Oral Care Recommendations: Oral care BID Follow up Recommendations: Skilled Nursing facility SLP Visit Diagnosis: Dysphagia, oropharyngeal phase (R13.12) Plan: Continue with current plan of care       GO                [name] 08/03/2017, 2:42 PM  [name], M.A.  CCC-SLP [phone]

## 2017-08-03 NOTE — Progress Notes (Signed)
CSW to continue to follow for discharge needs.   Percell Locus Vivica Dobosz LCSW [phone]

## 2017-08-03 NOTE — Progress Notes (Signed)
PT Cancellation Note  Patient Details Name: Leonard [name] MRN: [id number] DOB: [birth year]   Cancelled Treatment:    Reason Eval/Treat Not Completed: Medical issues which prohibited therapy(Hgb 7.3)   Michel Santee 08/03/2017, 12:04 PM

## 2017-08-03 NOTE — Progress Notes (Addendum)
Family Medicine Teaching Service Daily Progress Note Intern Pager: [phone]  [name] Medical record number: [id number] Date of birth: [birth year] Age: 67 y.o. Gender: male  Primary Care Provider: Kathrene Alu, MD Consultants: CCM Code Status: DNR/DNI, BiPAP or NIPPV only    Pt Overview and Major Events to Date:  07/05/17 admitted for acute respiratory failure  07/07/17 transferred to ICU for resp distress 07/25/17 out to [place] 6/22-6/29 weaning down O2, discontinued NG tube, advancing diet   Assessment and Plan: Leonard [name] is a 67 y.o. male who presented with SOB and developed acute respiratory failure. PMH is significant for COPD, CKD stage 4, CAD s/p CABG, T2DM, HTN, HLD.  Acute hypoxic respiratory failure, improving.  Likely 2/2 CHF exacerbation and steroid responsive pneumonitis of unknown etiology.  Per CCM unknown specific pathology.  Does have a large right pleural effusion that may be contributing.  Patient has remained afebrile and WBC is wnl.  Repeat CXR on 6/26 showed no change in R sided pleural effusion so we are continuing to monitor with weekly CXR, next on 7/2.  Took in 210 cc, UOP 500 cc in last 24 hours. On 2L per [place] currently, does not have home O2 requirement.  - hold off on thoracentesis, will obtain CXR on 7/2 to monitor progress.  -s/p ceftaz (6/15-6/19) for HCAP, now monitoring off ABX -monitor fluid status, strict I/Os, daily wts, chart reveals dry weight around 180# -monitor on telemetry - wean O2 as tolerated - on 2L currently  - continue Torsemide 20 mg PO daily  HTN. BP 135/52 this morning, stable. - continue amlodipine 10mg  qd, Coreg 25mg  BID, clonidine 0.2 mg patch, doxazosin 8mg  qhs, hydralazine 100mg  q8h, isosorbide dinitrate 20mg  TID, minoxidil 5mg  QD - prn IV hydralazine for SBP>160 or DBP>100  CKD IV. Cr 2.04 on 6/27, appears to be at baseline ~2.5. Follows with Dr. Marval Regal and has been undergoing preparations to start  HD. Improved to 1.79 this AM.   - monitor kidney function  Anemia of chronic disease. Hgb down to 7.3 today, has been 7.4-8.6, although patient has received blood transfusions during admission when hemoglobin <7.0.  Patient has history of CAD and therefore threshold to transfuse pRBC should be <8.0. - Transfuse 1 unit pRBC today  T2DM. a1c 6.9.  On 24 units Lantus and rSSI. - rSSI - continue Lantus 24U qd - monitor CBGs  HLD. - continue atorvastatin  Hypothyroidism. TSH 3.338 - continue levothyroxine  Gout - continue allopurinol  Drowsiness, improved  Avoid sedating medications.  - trazodone discontinued  FEN/GI: dysphagia 2 PPx: heparin SQ  Disposition: pending clinical improvement   Subjective:  Pt sleepy this AM and not cooperative with exam.  Denies SOB, CP, leg swelling.  No acute overnight events.   Objective: Temp:  [98.1 F (36.7 C)-98.6 F (37 C)] 98.2 F (36.8 C) (07/01 1700) Pulse Rate:  [65-70] 67 (07/01 1700) Resp:  [21-33] 24 (07/01 1700) BP: (118-146)/(48-60) 121/52 (07/01 1700) SpO2:  [94 %-100 %] 96 % (07/01 1700) Weight:  [82.2 kg (181 lb 3.5 oz)] 82.2 kg (181 lb 3.5 oz) (07/01 0401)   Physical Exam  Constitutional: He is oriented to person, place, and time.  67 yo male, in no acute distress, lying in bed  HENT:  Head: Normocephalic and atraumatic.  Cardiovascular: Normal rate and regular rhythm. Exam reveals friction rub. Exam reveals no gallop.  No murmur heard. Pulmonary/Chest: Effort normal and breath sounds normal. No respiratory distress. He  has no wheezes. He has no rales.  Abdominal: Soft. Bowel sounds are normal. There is no tenderness.  Musculoskeletal: He exhibits no edema.  L AKA  Neurological: He is alert and oriented to person, place, and time.  Skin: Skin is warm and dry.    Laboratory: Recent Labs  Lab 07/29/17 0529 07/30/17 0442 08/03/17 0509  WBC 6.2 4.8 9.0  HGB 8.6* 7.8* 7.3*  HCT 28.7* 25.6* 23.9*  PLT 174 164  123*   Recent Labs  Lab 07/29/17 0529 07/30/17 0442 08/03/17 0509  NA 146* 143 136  K 4.4 4.3 4.7  CL 117* 115* 108  CO2 19* 21* 21*  BUN 84* 78* 56*  CREATININE 2.04* 1.73* 1.79*  CALCIUM 8.2* 7.5* 7.9*  GLUCOSE 139* 105* 85   Mag 07/25/17: 2.5  Eosinophil count absolute 07/24/17: 0.0   Imaging/Diagnostic Tests: No results found.  Rittberger, Bernita Raisin, DO PGY-1, [place]

## 2017-08-04 ENCOUNTER — Inpatient Hospital Stay (HOSPITAL_COMMUNITY): Payer: Medicare Other

## 2017-08-04 LAB — BPAM RBC
BLOOD PRODUCT EXPIRATION DATE: 201907182359
ISSUE DATE / TIME: 201907011637
Unit Type and Rh: 1700

## 2017-08-04 LAB — BLOOD GAS, ARTERIAL
ACID-BASE DEFICIT: 2.3 mmol/L — AB (ref 0.0–2.0)
Acid-Base Excess: 0.3 mmol/L (ref 0.0–2.0)
BICARBONATE: 21 mmol/L (ref 20.0–28.0)
BICARBONATE: 23.2 mmol/L (ref 20.0–28.0)
DRAWN BY: 519031
DRAWN BY: 519031
O2 CONTENT: 3 L/min
O2 Content: 2 L/min
O2 SAT: 89.8 %
O2 Saturation: 89.4 %
PATIENT TEMPERATURE: 98.6
PATIENT TEMPERATURE: 98.6
PH ART: 7.454 — AB (ref 7.350–7.450)
pCO2 arterial: 30.4 mmHg — ABNORMAL LOW (ref 32.0–48.0)
pCO2 arterial: 30 mmHg — ABNORMAL LOW (ref 32.0–48.0)
pH, Arterial: 7.5 — ABNORMAL HIGH (ref 7.350–7.450)
pO2, Arterial: 56.1 mmHg — ABNORMAL LOW (ref 83.0–108.0)
pO2, Arterial: 52.9 mmHg — ABNORMAL LOW (ref 83.0–108.0)

## 2017-08-04 LAB — CBC
HCT: 27.4 % — ABNORMAL LOW (ref 39.0–52.0)
HEMOGLOBIN: 8.4 g/dL — AB (ref 13.0–17.0)
MCH: 27.6 pg (ref 26.0–34.0)
MCHC: 30.7 g/dL (ref 30.0–36.0)
MCV: 90.1 fL (ref 78.0–100.0)
PLATELETS: 120 10*3/uL — AB (ref 150–400)
RBC: 3.04 MIL/uL — AB (ref 4.22–5.81)
RDW: 18.3 % — ABNORMAL HIGH (ref 11.5–15.5)
WBC: 7.2 10*3/uL (ref 4.0–10.5)

## 2017-08-04 LAB — BASIC METABOLIC PANEL
ANION GAP: 11 (ref 5–15)
BUN: 56 mg/dL — ABNORMAL HIGH (ref 8–23)
CHLORIDE: 107 mmol/L (ref 98–111)
CO2: 19 mmol/L — ABNORMAL LOW (ref 22–32)
Calcium: 8.1 mg/dL — ABNORMAL LOW (ref 8.9–10.3)
Creatinine, Ser: 1.81 mg/dL — ABNORMAL HIGH (ref 0.61–1.24)
GFR calc Af Amer: 43 mL/min — ABNORMAL LOW (ref >60)
GFR, EST NON AFRICAN AMERICAN: 37 mL/min — AB (ref >60)
Glucose, Bld: 154 mg/dL — ABNORMAL HIGH (ref 70–99)
POTASSIUM: 4.7 mmol/L (ref 3.5–5.1)
SODIUM: 137 mmol/L (ref 135–145)

## 2017-08-04 LAB — TYPE AND SCREEN
ABO/RH(D): B NEG
Antibody Screen: NEGATIVE
Unit division: 0

## 2017-08-04 LAB — GLUCOSE, CAPILLARY
GLUCOSE-CAPILLARY: 197 mg/dL — AB (ref 70–99)
GLUCOSE-CAPILLARY: 162 mg/dL — AB (ref 70–99)
Glucose-Capillary: 102 mg/dL — ABNORMAL HIGH (ref 70–99)
Glucose-Capillary: 193 mg/dL — ABNORMAL HIGH (ref 70–99)

## 2017-08-04 MED ORDER — FUROSEMIDE 10 MG/ML IJ SOLN
40.0000 mg | Freq: Once | INTRAMUSCULAR | Status: AC
  Administered 2017-08-04: 40 mg via INTRAVENOUS
  Filled 2017-08-04: qty 4

## 2017-08-04 NOTE — Progress Notes (Signed)
Physical Therapy Treatment Patient Details Name: Leonard [name] MRN: [id number] DOB: 08-Dec-1950 Today's Date: 08/04/2017    History of Present Illness  Pt is a 67 y/o male with PMH significant for COPD, CKD, CADs/p CABG, DM, and HTN admitted with pneumonitis    PT Comments    Patient received supine in bed. Difficult PT session. Patient able to express desire to transfer to recliner with PT, however, limited participation by patient. Max A +2 for rolling in bed for peri care and general hygiene. Also requiring Max A +2/3 for sidelying to sit EOB with patient unable to maintain upright seated posture without Max A from PT with patient leaning in all planes. Transfer deferred due to patient safety concerns and limited effort demonstrated. PT recommendations continue to be appropriate.      Follow Up Recommendations  SNF;Supervision/Assistance - 24 hour     Equipment Recommendations  None recommended by PT    Recommendations for Other Services       Precautions / Restrictions Precautions Precautions: Fall Restrictions Weight Bearing Restrictions: No Other Position/Activity Restrictions: chronic L AKA    Mobility  Bed Mobility Overal bed mobility: Needs Assistance Bed Mobility: Rolling;Sidelying to Sit;Sit to Sidelying Rolling: Max assist;+2 for physical assistance Sidelying to sit: Max assist;+2 for physical assistance     Sit to sidelying: Max assist;+2 for physical assistance General bed mobility comments: very little participation/effort given by patient  Transfers                 General transfer comment: deferred due to limited patient participation  Ambulation/Gait                 Stairs             Wheelchair Mobility    Modified Rankin (Stroke Patients Only)       Balance Overall balance assessment: Needs assistance Sitting-balance support: Bilateral upper extremity supported;Feet supported Sitting balance-Leahy Scale:  Poor Sitting balance - Comments: patient unable to maintain upright seated posture for more than a few seconds; Max A to maintain; leaning in all planes Postural control: Posterior lean;Right lateral lean;Left lateral lean                                  Cognition Arousal/Alertness: Lethargic Behavior During Therapy: Flat affect Overall Cognitive Status: No family/caregiver present to determine baseline cognitive functioning                                        Exercises      General Comments        Pertinent Vitals/Pain Pain Assessment: Faces Faces Pain Scale: No hurt    Home Living                      Prior Function            PT Goals (current goals can now be found in the care plan section) Acute Rehab PT Goals Patient Stated Goal: non stated Progress towards PT goals: Progressing toward goals    Frequency    Min 2X/week      PT Plan Current plan remains appropriate    Co-evaluation              AM-PAC PT "6 Clicks" Daily Activity  Outcome Measure  Difficulty turning over in bed (including adjusting bedclothes, sheets and blankets)?: Unable Difficulty moving from lying on back to sitting on the side of the bed? : Unable Difficulty sitting down on and standing up from a chair with arms (e.g., wheelchair, bedside commode, etc,.)?: Unable Help needed moving to and from a bed to chair (including a wheelchair)?: Total Help needed walking in hospital room?: Total Help needed climbing 3-5 steps with a railing? : Total 6 Click Score: 6    End of Session Equipment Utilized During Treatment: Oxygen Activity Tolerance: Patient limited by fatigue;Patient limited by lethargy Patient left: in bed;with call bell/phone within reach;with bed alarm set;with nursing/sitter in room Nurse Communication: Mobility status PT Visit Diagnosis: Muscle weakness (generalized) (M62.81);Difficulty in walking, not elsewhere classified  (R26.2)     Time: 9122-5834 PT Time Calculation (min) (ACUTE ONLY): 31 min  Charges:  $Therapeutic Activity: 23-37 mins                    G Codes:       Lanney Gins, PT, DPT 08/04/17 2:02 PM

## 2017-08-04 NOTE — Progress Notes (Signed)
  Interim Progress Note  Called by RN due to concerns for somnolence, increased RR. Went to assess patient. He is sleeping, hard to awaken initially but does response to sternal rub and state his name and location. He denies pain, he denies SOB. He has increased work of breathing. He moves all extremities. Does not appear to have focal deficit. No LE Edema. Lungs with crackles in bases. Will give more IV lasix to help with pulmonary congestion and order head CT as we discussed in rounds to r/o any acute event that may explain increased somnolence over past 24-48 hours. Will follow.  Lucila Maine, DO PGY-2, [facility] 08/04/2017 6:32 PM

## 2017-08-04 NOTE — Progress Notes (Signed)
Family Medicine Teaching Service Daily Progress Note Intern Pager: [phone]  [name] Medical record number: [id number] Date of birth: 1950-03-31 Age: 67 y.o. Gender: male  Primary Care Provider: Kathrene Alu, MD Consultants: CCM Code Status: DNR/DNI, BiPAP or NIPPV only    Pt Overview and Major Events to Date:  07/05/17 admitted for acute respiratory failure  07/07/17 transferred to ICU for resp distress 07/25/17 out to [place] 6/22-6/29 weaning down O2, discontinued NG tube, advancing diet   Assessment and Plan: Leonard [name] is a 67 y.o. male who presented with SOB and developed acute respiratory failure. PMH is significant for COPD, CKD stage 4, CAD s/p CABG, T2DM, HTN, HLD.  Acute hypoxic respiratory failure, improving.  Likely 2/2 CHF exacerbation and steroid responsive pneumonitis of unknown etiology.  Per CCM unknown specific pathology.  Does have a large right pleural effusion that may be contributing.  Patient has remained afebrile and WBC is wnl.  Repeat CXR on 6/26 showed no change in R sided pleural effusion so we are continuing to monitor with weekly CXR, next on 7/2.  Took in 210 cc, UOP 500 cc in last 24 hours. Reports increased difficulty breathing overnight with sats in mid 80s.  Nurse reports that prior to examination, patient sats again dropped to mid 80s, was sat up and O2 increased to 5L.  Pulse Ox at 93.  On 5L per [place] currently, does not have home O2 requirement.  - hold off on thoracentesis - ABG this AM shows Respiratory Alkalosis. - F/U CXR this AM showed poor aeration with moderate-sized bilateral pleural effusions and probable CHF, Questionable nodule in the left mid lung not definitely seen on CT chest 07/11/2017. Recommend attention to this area on follow-up chest x-rays. -s/p ceftaz (6/15-6/19) for HCAP, now monitoring off ABX -monitor fluid status, strict I/Os (on condom cath), daily wts, chart reveals dry weight around 180# -monitor on  telemetry - wean O2 as tolerated - on 5L currently  - d/c Torsemide 20 mg PO daily - Lasix IV 40mg  once ordered - f/u PT for ambulating with O2  Mental Status Changes Patient presented with AMS and has waxed and waned throughout admission.  This AM he was A&Ox3, but rather somnolent and dozed off while being asked questions.  CT head on 07/18/17 showed no acute findings with mild chronic small vessel ischemic change within the white matter and advanced atherosclerotic changes of the large vessels at the skull base.   - continue to monitor mental status - if worsens, will consider repeat CT of head   HTN. BP 153/57 this morning.  160/106 at time of exam, but 122/52 at 1109. - continue amlodipine 10mg  qd, Coreg 25mg  BID, clonidine 0.2 mg patch, doxazosin 8mg  qhs, hydralazine 100mg  q8h, isosorbide dinitrate 20mg  TID, minoxidil 5mg  QD - prn IV hydralazine for SBP>160 or DBP>100  CKD IV. Cr 2.04 on 6/27, appears to be at baseline ~2.5. Follows with Dr. Marval Regal and has been undergoing preparations to start HD. Continues to improve, 1.81 this AM.   - monitor kidney function  Anemia of chronic disease. Hgb down to 7.3 today, has been 7.4-8.6, although patient has received blood transfusions during admission when hemoglobin <7.0.  Patient has history of CAD and therefore threshold to transfuse pRBC should be <8.0. S/P 1 unit pRBC 7/1.  Hgb this AM 8.4. - Continue to monitor Hgb - Weight risks/benefits of discharging patient with ASA.  T2DM. a1c 6.9.  On 24 units Lantus  and rSSI. CBG 154 at 0014. - rSSI - continue Lantus 24U qd - monitor CBGs  HLD. - continue atorvastatin  Hypothyroidism. TSH 3.338 - continue levothyroxine  Gout - continue allopurinol  Drowsiness, improved  Avoid sedating medications.  - trazodone discontinued  FEN/GI: dysphagia 2 PPx: heparin SQ  Disposition: pending clinical improvement, back to [facility]  Subjective:  Patient admits to increased difficulty  in breathing since last PM.  Also admits to some chills and productive cough, but unsure what sputum looks like.  Objective: Temp:  [98.2 F (36.8 C)-99.4 F (37.4 C)] 98.2 F (36.8 C) (07/02 1252) Pulse Rate:  [67-76] 67 (07/02 1252) Resp:  [19-31] 20 (07/02 1252) BP: (118-160)/(48-106) 132/58 (07/02 1252) SpO2:  [76 %-96 %] 93 % (07/02 1252)   Physical Exam  General: 67 yo male, in NAD, sitting with bed propped up HEENT: NCAT Cardio: RRR no m/r/g Lungs: Normal effort, no respiratory distress.  No wheezes, crackles L>R bases. Abdomen: Soft, non-tender, +bowel sounds MSK: No edema, L AKA Skin: Warm and Dry  Laboratory: Recent Labs  Lab 07/30/17 0442 08/03/17 0509 08/04/17 0014  WBC 4.8 9.0 7.2  HGB 7.8* 7.3* 8.4*  HCT 25.6* 23.9* 27.4*  PLT 164 123* 120*   Recent Labs  Lab 07/30/17 0442 08/03/17 0509 08/04/17 0014  NA 143 136 137  K 4.3 4.7 4.7  CL 115* 108 107  CO2 21* 21* 19*  BUN 78* 56* 56*  CREATININE 1.73* 1.79* 1.81*  CALCIUM 7.5* 7.9* 8.1*  GLUCOSE 105* 85 154*   Mag 07/25/17: 2.5  Eosinophil count absolute 07/24/17: 0.0   Imaging/Diagnostic Tests: Dg Chest 2 View  Result Date: 08/04/2017 CLINICAL DATA:  History of pleural effusion, follow-up EXAM: CHEST - 2 VIEW COMPARISON:  Portable chest x-ray of 07/29/2017, and CT chest of 07/11/2017 FINDINGS: The lungs are very poorly aerated. There are bilateral pleural effusions present with volume loss at the lung bases and cardiomegaly. There does appear to be a degree of pulmonary vascular congestion, findings consistent with CHF. There is a questionable nodule in left mid lung on this portable film. I did review the CT chest of 07/11/2017 and do not see a nodule although at that time there was considerable volume loss with left effusion present which could obscure a nodule. Attention to this area on follow-up chest x-rays is recommended. IMPRESSION: 1. Poor aeration with moderate-sized bilateral pleural effusions  and probable CHF. 2. Questionable nodule in the left mid lung not definitely seen on CT chest 07/11/2017. Recommend attention to this area on follow-up chest x-rays. Electronically Signed   By: Ivar Drape M.D.   On: 08/04/2017 10:05    Rittberger, Bernita Raisin, DO PGY-1, [place]

## 2017-08-04 NOTE — Progress Notes (Signed)
Patient noted to be more lethargic than earlier in the day.  Only briefly opening his eyes from sternal rub before falling back asleep.  Unable to follow commands or respond to questions.  VSS: BP 140/60 (MAP 85), O2 Saturation 95% on 2L [place], RR: 22, temp 98.27F, HR 71.  MD notified and came to bedside with orders for ABG.  Patient noted to be more alert.  Will continue to monitor.

## 2017-08-05 ENCOUNTER — Ambulatory Visit: Payer: Medicare Other | Admitting: Podiatry

## 2017-08-05 ENCOUNTER — Inpatient Hospital Stay (HOSPITAL_COMMUNITY): Payer: Medicare Other

## 2017-08-05 LAB — CBC
HCT: 27.1 % — ABNORMAL LOW (ref 39.0–52.0)
Hemoglobin: 8.4 g/dL — ABNORMAL LOW (ref 13.0–17.0)
MCH: 27.9 pg (ref 26.0–34.0)
MCHC: 31 g/dL (ref 30.0–36.0)
MCV: 90 fL (ref 78.0–100.0)
PLATELETS: 149 10*3/uL — AB (ref 150–400)
RBC: 3.01 MIL/uL — AB (ref 4.22–5.81)
RDW: 18.4 % — AB (ref 11.5–15.5)
WBC: 5.9 10*3/uL (ref 4.0–10.5)

## 2017-08-05 LAB — BASIC METABOLIC PANEL
Anion gap: 10 (ref 5–15)
BUN: 51 mg/dL — AB (ref 8–23)
CHLORIDE: 108 mmol/L (ref 98–111)
CO2: 21 mmol/L — AB (ref 22–32)
CREATININE: 1.69 mg/dL — AB (ref 0.61–1.24)
Calcium: 8.3 mg/dL — ABNORMAL LOW (ref 8.9–10.3)
GFR calc Af Amer: 47 mL/min — ABNORMAL LOW (ref >60)
GFR calc non Af Amer: 41 mL/min — ABNORMAL LOW (ref >60)
Glucose, Bld: 155 mg/dL — ABNORMAL HIGH (ref 70–99)
Potassium: 4.6 mmol/L (ref 3.5–5.1)
Sodium: 139 mmol/L (ref 135–145)

## 2017-08-05 LAB — GLUCOSE, CAPILLARY
Glucose-Capillary: 148 mg/dL — ABNORMAL HIGH (ref 70–99)
Glucose-Capillary: 130 mg/dL — ABNORMAL HIGH (ref 70–99)
Glucose-Capillary: 148 mg/dL — ABNORMAL HIGH (ref 70–99)
Glucose-Capillary: 118 mg/dL — ABNORMAL HIGH (ref 70–99)

## 2017-08-05 MED ORDER — FUROSEMIDE 10 MG/ML IJ SOLN
80.0000 mg | Freq: Once | INTRAMUSCULAR | Status: AC
  Administered 2017-08-05: 80 mg via INTRAVENOUS
  Filled 2017-08-05: qty 8

## 2017-08-05 NOTE — Progress Notes (Signed)
Nutrition Follow-up  DOCUMENTATION CODES:   Not applicable  INTERVENTION:  Continue Ensure Enlive po TID, each supplement provides 350 kcal and 20 grams of protein  Magic cup TID with meals, each supplement provides 290 kcal and 9 grams of protein   NUTRITION DIAGNOSIS:   Inadequate oral intake related to poor appetite as evidenced by meal completion < 50%. -ongoing  GOAL:   Patient will meet greater than or equal to 90% of their needs -unmet  MONITOR:   PO intake, Supplement acceptance, Weight trends  ASSESSMENT:   67 y.o. M admitted on 07/05/17 for acute respiratory failure with hypoxia and CHF exacerbation. PMH of HTN, PAD, CHF, CAD, hx of skin cancer, T2DM, hx of gangrene of toe 2014, CKD stg IV, and anemia. 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 Pt intubate 6/5.    Attempted to speak with patient at bedside but he would not arouse for this RD today. Had lunch tray and open ensure at bedside; he had eaten 50% and may have had a few sips of ensure.  Currently being diuresed for fluid overload.   1337mL UOP last shift, 1.8L fluid negative  Labs reviewed:  BUN/Cr 51/1.69  Medications reviewed and include:  Iron, Insulin, Synthroid   Diet Order:   Diet Order           DIET DYS 2 Room service appropriate? Yes; Fluid consistency: Thin  Diet effective now          EDUCATION NEEDS:   Not appropriate for education at this time  Skin:  Skin Assessment: Skin Integrity Issues: Skin Integrity Issues:: Other (Comment), Stage II Stage II: coccyx Other: R foot wound  Last BM:  08/02/17  Height:   Ht Readings from Last 1 Encounters:  07/05/17 5\' 8"  (1.727 m)    Weight:   Wt Readings from Last 1 Encounters:  08/03/17 181 lb 3.5 oz (82.2 kg)    Ideal Body Weight:  64.4 kg  BMI:  Body mass index is 27.55 kg/m.  Estimated Nutritional Needs:   Kcal:  2100-2300 kcal  Protein:  120-135 g  Fluid:  2.1 L    Satira Anis. Shelvie Salsberry, MS, RD LDN Inpatient Clinical  Dietitian Pager [phone]

## 2017-08-05 NOTE — Progress Notes (Signed)
Family Medicine Teaching Service Daily Progress Note Intern Pager: [phone]  [name] Medical record number: [id number] Date of birth: December 15, 1950 Age: 67 y.o. Gender: male  Primary Care Provider: Kathrene Alu, MD Consultants: CCM Code Status: DNR/DNI, BiPAP or NIPPV only    Pt Overview and Major Events to Date:  07/05/17 admitted for acute respiratory failure  07/07/17 transferred to ICU for resp distress 07/25/17 out to [place] 6/22-6/29 weaning down O2, discontinued NG tube, advancing diet   Assessment and Plan: Leonard [name] is a 67 y.o. male who presented with SOB and developed acute respiratory failure. PMH is significant for COPD, CKD stage 4, CAD s/p CABG, T2DM, HTN, HLD.  Acute hypoxic respiratory failure.  Likely 2/2 CHF exacerbation and steroid responsive pneumonitis of unknown etiology.  Per CCM unknown specific pathology.  Patient had known large right sided pleural effusion that is being followed by weekly CXR.  Had remained stable until follow up CXR on 7/2 showed moderate sized bilateral pleural effusions and questionable nodule in left mid lung, next follow up CXR on 7/9. On 7/2 patient had reported difficulty breathing, with sats in low 80s, required 5L O2, was given IV Lasix 40, Torsemide D/Cd, and head of patient's bed was raised. Yesterday evening, patient was somnolent and had increased work of breathing.  Another dose of IV Lasix 40 was given and CT head ordered.  Diuresed well with net -1112 cc over last 24 hours.  This AM, upon entering room, patient was lying in bed with pulse ox at 73 on 3L O2 and breathing was labored.  Head of patient's bed was raised and O2 increased to 5L, sats increased to 99.  O2 was decreased to 3L again while conversing with patient and patient was transferred to chair.  Patient resting comforably on 3 L with O2 sats at 93.  - hold off on thoracentesis - F/U CXR 7/9, monitor bilateral pleural effusions and nodule -s/p  ceftaz (6/15-6/19) for HCAP, now monitoring off ABX -monitor fluid status, strict I/Os (on condom cath), daily wts, chart reveals dry weight around 180# -monitor on telemetry - wean O2 as tolerated - on 3L currently - d/c Torsemide 20 mg PO daily - Lasix IV 80mg  once ordered, can consider repeat as needed - f/u PT for ambulating with O2  Drowsiness Patient presented with AMS.  Drowsiness has continued waxed and waned throughout admission.  This AM patient appeared somnolent while O2 saturation was in low 70s, but improved with improvement of respiratory status and repositioning.  CT head ordered last PM due to increased somnolence, showed no acute findings.   - continue to monitor  - start CPAP at night - avoid sedating medications   HTN. BP 144/53 this morning. Stable. - continue amlodipine 10mg  qd, Coreg 25mg  BID, clonidine 0.2 mg patch, doxazosin 8mg  qhs, hydralazine 100mg  q8h, isosorbide dinitrate 20mg  TID, minoxidil 5mg  QD - prn IV hydralazine for SBP>160 or DBP>100  CKD IV. Cr 2.04 on 6/27, appears to be at baseline ~2.5. Follows with Dr. Marval Regal and has been undergoing preparations to start HD. Continues to improve, 1.69 on 7/3.   - monitor kidney function  Anemia of chronic disease. Hgb stable at 8.4 this AM, has been 7.4-8.6, although patient has received blood transfusions during admission when hemoglobin <7.0.  Patient has history of CAD and therefore threshold to transfuse pRBC should be <8.0. - Continue to monitor Hgb  - Weigh risks/benefits of discharging patient with ASA.  T2DM.  a1c 6.9.  On 24 units Lantus and rSSI. CBG 130 at 0740. - rSSI - continue Lantus 24U qd - monitor CBGs  HLD. - continue atorvastatin  Hypothyroidism. TSH 3.338 - continue levothyroxine  Gout - continue allopurinol   FEN/GI: dysphagia 2 PPx: heparin SQ  Disposition: pending clinical improvement, back to [facility]  Subjective:  Patient continues to note difficulty breathing  while lying flat.  States that he breathing is better when he is able to sit up.  Denies chest pain.  Objective: Temp:  [98.2 F (36.8 C)-99 F (37.2 C)] 98.3 F (36.8 C) (07/03 0445) Pulse Rate:  [67-74] 74 (07/03 0445) Resp:  [20-23] 23 (07/03 0445) BP: (122-160)/(52-106) 144/53 (07/03 0445) SpO2:  [86 %-95 %] 87 % (07/03 0445)    Physical Exam  General: 67 yo male, in some distress while lying in bed, that improved with sitting patient up and increasing O2 from 3L to 5L HEENT: NCAT Cardio: RRR no m/r/g Lungs: Labored breathing while lying in bed, normal effort while sitting up, bibasilar crackles noted Abdomen: Soft, non-tender, +bowel sounds MSK: No edema in RLE, L AKA Skin: Warm and Dry  Laboratory: Recent Labs  Lab 07/30/17 0442 08/03/17 0509 08/04/17 0014  WBC 4.8 9.0 7.2  HGB 7.8* 7.3* 8.4*  HCT 25.6* 23.9* 27.4*  PLT 164 123* 120*   Recent Labs  Lab 07/30/17 0442 08/03/17 0509 08/04/17 0014  NA 143 136 137  K 4.3 4.7 4.7  CL 115* 108 107  CO2 21* 21* 19*  BUN 78* 56* 56*  CREATININE 1.73* 1.79* 1.81*  CALCIUM 7.5* 7.9* 8.1*  GLUCOSE 105* 85 154*   Mag 07/25/17: 2.5  Eosinophil count absolute 07/24/17: 0.0   Imaging/Diagnostic Tests: Dg Chest 2 View  Result Date: 08/04/2017 CLINICAL DATA:  History of pleural effusion, follow-up EXAM: CHEST - 2 VIEW COMPARISON:  Portable chest x-ray of 07/29/2017, and CT chest of 07/11/2017 FINDINGS: The lungs are very poorly aerated. There are bilateral pleural effusions present with volume loss at the lung bases and cardiomegaly. There does appear to be a degree of pulmonary vascular congestion, findings consistent with CHF. There is a questionable nodule in left mid lung on this portable film. I did review the CT chest of 07/11/2017 and do not see a nodule although at that time there was considerable volume loss with left effusion present which could obscure a nodule. Attention to this area on follow-up chest x-rays is  recommended. IMPRESSION: 1. Poor aeration with moderate-sized bilateral pleural effusions and probable CHF. 2. Questionable nodule in the left mid lung not definitely seen on CT chest 07/11/2017. Recommend attention to this area on follow-up chest x-rays. Electronically Signed   By: Ivar Drape M.D.   On: 08/04/2017 10:05   Ct Head Wo Contrast  Result Date: 08/05/2017 CLINICAL DATA:  67 y/o  M; concerns for somnolence. EXAM: CT HEAD WITHOUT CONTRAST TECHNIQUE: Contiguous axial images were obtained from the base of the skull through the vertex without intravenous contrast. COMPARISON:  07/18/2017 CT head. FINDINGS: Brain: No evidence of acute infarction, hemorrhage, hydrocephalus, extra-axial collection or mass lesion/mass effect. Stable chronic microvascular ischemic changes and parenchymal volume loss of the brain. 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 Vascular: Calcific atherosclerosis of carotid siphons and the vertebral arteries. Skull: Normal. Negative for fracture or focal lesion. Sinuses/Orbits: Stable trace bilateral mastoid effusions. Left sphenoid sinus opacification. Bilateral intra-ocular lens replacement. Other: None. IMPRESSION: 1. No acute intracranial abnormality identified.  2. Stable chronic microvascular ischemic changes and parenchymal volume loss of the brain. 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 Electronically Signed   By: Kristine Garbe M.D.   On: 08/05/2017 01:28    Rittberger, Leonard Raisin, DO PGY-1, [place]

## 2017-08-06 LAB — CBC
HEMATOCRIT: 25 % — AB (ref 39.0–52.0)
Hemoglobin: 7.7 g/dL — ABNORMAL LOW (ref 13.0–17.0)
MCH: 27.8 pg (ref 26.0–34.0)
MCHC: 30.8 g/dL (ref 30.0–36.0)
MCV: 90.3 fL (ref 78.0–100.0)
Platelets: 152 10*3/uL (ref 150–400)
RBC: 2.77 MIL/uL — ABNORMAL LOW (ref 4.22–5.81)
RDW: 18 % — AB (ref 11.5–15.5)
WBC: 5.3 10*3/uL (ref 4.0–10.5)

## 2017-08-06 LAB — GLUCOSE, CAPILLARY
GLUCOSE-CAPILLARY: 120 mg/dL — AB (ref 70–99)
Glucose-Capillary: 117 mg/dL — ABNORMAL HIGH (ref 70–99)
Glucose-Capillary: 156 mg/dL — ABNORMAL HIGH (ref 70–99)

## 2017-08-06 LAB — BASIC METABOLIC PANEL
Anion gap: 5 (ref 5–15)
BUN: 51 mg/dL — AB (ref 8–23)
CHLORIDE: 110 mmol/L (ref 98–111)
CO2: 23 mmol/L (ref 22–32)
Calcium: 8 mg/dL — ABNORMAL LOW (ref 8.9–10.3)
Creatinine, Ser: 1.67 mg/dL — ABNORMAL HIGH (ref 0.61–1.24)
GFR calc Af Amer: 48 mL/min — ABNORMAL LOW (ref >60)
GFR calc non Af Amer: 41 mL/min — ABNORMAL LOW (ref >60)
Glucose, Bld: 95 mg/dL (ref 70–99)
POTASSIUM: 4.4 mmol/L (ref 3.5–5.1)
Sodium: 138 mmol/L (ref 135–145)

## 2017-08-06 MED ORDER — FUROSEMIDE 10 MG/ML IJ SOLN
80.0000 mg | Freq: Once | INTRAMUSCULAR | Status: AC
  Administered 2017-08-06: 80 mg via INTRAVENOUS
  Filled 2017-08-06: qty 8

## 2017-08-06 NOTE — Progress Notes (Signed)
Family Medicine Teaching Service Daily Progress Note Intern Pager: [phone]  [name] Medical record number: [id number] Date of birth: 03/30/1950 Age: 67 y.o. Gender: male  Primary Care Provider: Kathrene Alu, MD Consultants: CCM Code Status: DNR/DNI, BiPAP or NIPPV only    Pt Overview and Major Events to Date:  07/05/17 admitted for acute respiratory failure  07/07/17 transferred to ICU for resp distress 07/25/17 out to [place] 6/22-6/29 weaning down O2, discontinued NG tube, advancing diet   Assessment and Plan: Leonard [name] is a 67 y.o. male who presented with SOB and developed acute respiratory failure. PMH is significant for COPD, CKD stage 4, CAD s/p CABG, T2DM, HTN, HLD.  Acute hypoxic respiratory failure.  Likely 2/2 CHF exacerbation and steroid responsive pneumonitis of unknown etiology.  Per CCM unknown specific pathology.  Patient had known large right sided pleural effusion that is being followed by weekly CXR.  Had remained stable until follow up CXR on 7/2 showed moderate sized bilateral pleural effusions and questionable nodule in left mid lung, next follow up CXR on 7/9. On 7/2 and 7/3 patient had episodes in the AM when O2 sats dropped to 70s-low 80s.  Corrected with sitting up patient and increasing O2.  Given IV Lasix 80mg  on 7/2 and 7/3. Nurse reports that this AM, patient's pulse ox again decreased into the 70s, but corrected with increasing O2 to 5L.  Patient resting comfortable and sitting up while examined. Pulse ox reading 100 on 5L. Decreased to 4L and patient continued to sat at 100, without increased work of breathing. Patient denies CP, SOB.  Bibasilar crackles on exam.  Net I/O over last 24 hrs is 0, with one unmeasured urine output, therefore appears to be diuresing, but not accurate. - hold off on thoracentesis - F/U CXR 7/9, monitor bilateral pleural effusions and nodule -s/p ceftaz (6/15-6/19) for HCAP, now monitoring off ABX -monitor  fluid status, strict I/Os (on condom cath), daily wts, chart reveals dry weight around 180# -monitor on telemetry - wean O2 as tolerated - on 4L currently, nurse educated on O2 goal of 88-92% - Holding Torsemide 20 mg PO daily -Repeat Lasix IV 80mg  today - f/u PT for ambulating with O2  Drowsiness Patient presented with AMS.  Drowsiness has continued waxed and waned throughout admission.  This AM patient appeared mildly somnolent, but remains A&O x3.  CT head 7/2 showed no acute findings. CPAP ordered, did not receive last night. - continue to monitor  - CPAP ordered, monitor for improvement following CPAP therapy - avoid sedating medications   HTN. BP 139/60 this morning. Stable. - continue amlodipine 10mg  qd, Coreg 25mg  BID, clonidine 0.2 mg patch, doxazosin 8mg  qhs, hydralazine 100mg  q8h, isosorbide dinitrate 20mg  TID, minoxidil 5mg  QD - prn IV hydralazine for SBP>160 or DBP>100  CKD IV. Cr 2.04 on 6/27, appears to be at baseline ~2.5. Follows with Dr. Marval Regal and has been undergoing preparations to start HD. Continues to improve, 1.67 on 7/4.   - monitor kidney function  Anemia of chronic disease. Hgb trending down at 7.7 this Am from 8.4, has been 7.4-8.6, although patient has received blood transfusions during admission when hemoglobin <7.0.  Patient has history of CAD and therefore threshold to transfuse pRBC should be <8.0. - Continue to monitor Hgb, will hold off on transfusing due to fluid overload and only slight decrease, patient asymptomatic  - Weigh risks/benefits of discharging patient with ASA.  T2DM. a1c 6.9.  On 24 units  Lantus and rSSI. CBG 95 at 0441. - rSSI - continue Lantus 24U qd - monitor CBGs  HLD. - continue atorvastatin  Hypothyroidism. TSH 3.338 - continue levothyroxine  Gout - continue allopurinol   FEN/GI: dysphagia 2 PPx: heparin SQ  Disposition: pending clinical improvement, back to [facility]  Subjective:  Patient sitting up in bed,  resting comfortably.  Denies complaints.  Denies SOB, CP.  Objective: Temp:  [98.7 F (37.1 C)-99 F (37.2 C)] 98.7 F (37.1 C) (07/04 0532) Pulse Rate:  [67-73] 71 (07/04 0532) Resp:  [18-20] 18 (07/04 0532) BP: (132-139)/(56-61) 139/60 (07/04 0532) SpO2:  [91 %-93 %] 93 % (07/04 0532) Weight:  [175 lb 0.7 oz (79.4 kg)] 175 lb 0.7 oz (79.4 kg) (07/04 0232)   Physical Exam: General: 67 yo male in NAD, resting comfortably, slightly somnolent HEENT: NCAT Cardio: RRR no m/r/g Lungs: Normal work of breathing, bibasilar crackles, no wheezing Abdomen: Soft, non-tender, bowel sounds normotensive, non-pitting edema MSK: No edema in RLE, L AKA Skin: Warm and dry   Laboratory: Recent Labs  Lab 08/04/17 0014 08/05/17 0915 08/06/17 0441  WBC 7.2 5.9 5.3  HGB 8.4* 8.4* 7.7*  HCT 27.4* 27.1* 25.0*  PLT 120* 149* 152   Recent Labs  Lab 08/04/17 0014 08/05/17 0915 08/06/17 0441  NA 137 139 138  K 4.7 4.6 4.4  CL 107 108 110  CO2 19* 21* 23  BUN 56* 51* 51*  CREATININE 1.81* 1.69* 1.67*  CALCIUM 8.1* 8.3* 8.0*  GLUCOSE 154* 155* 95   Mag 07/25/17: 2.5  Eosinophil count absolute 07/24/17: 0.0   Imaging/Diagnostic Tests: Dg Chest 2 View  Result Date: 08/04/2017 CLINICAL DATA:  History of pleural effusion, follow-up EXAM: CHEST - 2 VIEW COMPARISON:  Portable chest x-ray of 07/29/2017, and CT chest of 07/11/2017 FINDINGS: The lungs are very poorly aerated. There are bilateral pleural effusions present with volume loss at the lung bases and cardiomegaly. There does appear to be a degree of pulmonary vascular congestion, findings consistent with CHF. There is a questionable nodule in left mid lung on this portable film. I did review the CT chest of 07/11/2017 and do not see a nodule although at that time there was considerable volume loss with left effusion present which could obscure a nodule. Attention to this area on follow-up chest x-rays is recommended. IMPRESSION: 1. Poor aeration  with moderate-sized bilateral pleural effusions and probable CHF. 2. Questionable nodule in the left mid lung not definitely seen on CT chest 07/11/2017. Recommend attention to this area on follow-up chest x-rays. Electronically Signed   By: Ivar Drape M.D.   On: 08/04/2017 10:05   Ct Head Wo Contrast  Result Date: 08/05/2017 CLINICAL DATA:  67 y/o  M; concerns for somnolence. EXAM: CT HEAD WITHOUT CONTRAST TECHNIQUE: Contiguous axial images were obtained from the base of the skull through the vertex without intravenous contrast. COMPARISON:  07/18/2017 CT head. FINDINGS: Brain: No evidence of acute infarction, hemorrhage, hydrocephalus, extra-axial collection or mass lesion/mass effect. Stable chronic microvascular ischemic changes and parenchymal volume loss of the brain. 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 Vascular: Calcific atherosclerosis of carotid siphons and the vertebral arteries. Skull: Normal. Negative for fracture or focal lesion. Sinuses/Orbits: Stable trace bilateral mastoid effusions. Left sphenoid sinus opacification. Bilateral intra-ocular lens replacement. Other: None. IMPRESSION: 1. No acute intracranial abnormality identified. 2. Stable chronic microvascular ischemic changes and parenchymal volume loss of the brain. Stable small chronic lacunar infarct in the left  caudate head. Electronically Signed   By: Kristine Garbe M.D.   On: 08/05/2017 01:28    Rittberger, Bernita Raisin, DO  PGY-1, [place]

## 2017-08-06 NOTE — Progress Notes (Signed)
Physical Therapy Treatment Patient Details Name: NATALIE LECLAIRE MRN: [id number] DOB: Aug 15, 1950 Today's Date: 08/06/2017    History of Present Illness  Pt is a 67 y/o male with PMH significant for COPD, CKD, CADs/p CABG, DM, and HTN admitted with pneumonitis    PT Comments    Patient with much improved clinical presentation today. Patient alert when speaking to him and demonstrating much more participation with PT today. Requires Min/Mod A for bed mobility today, but actively assisting with mobility and repositioning in bed. O2 on 3.5 L via [place] with O2 sat 84-97%. Attempted to sit EOB without UE support but patient unable/refusing. PT to continue to follow.    Follow Up Recommendations  SNF;Supervision/Assistance - 24 hour     Equipment Recommendations  None recommended by PT    Recommendations for Other Services       Precautions / Restrictions Precautions Precautions: Fall Restrictions Weight Bearing Restrictions: No LLE Weight Bearing: Non weight bearing Other Position/Activity Restrictions: chronic L AKA    Mobility  Bed Mobility Overal bed mobility: Needs Assistance Bed Mobility: Rolling;Sidelying to Sit;Sit to Sidelying Rolling: Min assist Sidelying to sit: Mod assist;+2 for physical assistance     Sit to sidelying: Min assist;+2 for physical assistance General bed mobility comments: patient with much different presentation today - much improved participation with bed mobility  Transfers                 General transfer comment: deferred per patient request  Ambulation/Gait                 Stairs             Wheelchair Mobility    Modified Rankin (Stroke Patients Only)       Balance Overall balance assessment: Needs assistance Sitting-balance support: Bilateral upper extremity supported;Feet supported Sitting balance-Leahy Scale: Fair Sitting balance - Comments: able to sit EOB with B UE dupport for ~5 min                                     Cognition Arousal/Alertness: Awake/alert;Lethargic Behavior During Therapy: Flat affect Overall Cognitive Status: No family/caregiver present to determine baseline cognitive functioning                                 General Comments: does require cueing and motivation to perform activities; seemingly only performing when he wants to      Exercises      General Comments        Pertinent Vitals/Pain Pain Assessment: No/denies pain    Home Living                      Prior Function            PT Goals (current goals can now be found in the care plan section) Acute Rehab PT Goals Patient Stated Goal: none stated Progress towards PT goals: Progressing toward goals    Frequency    Min 2X/week      PT Plan Current plan remains appropriate    Co-evaluation              AM-PAC PT "6 Clicks" Daily Activity  Outcome Measure  Difficulty turning over in bed (including adjusting bedclothes, sheets and blankets)?: Unable Difficulty moving from lying on back to sitting  on the side of the bed? : Unable Difficulty sitting down on and standing up from a chair with arms (e.g., wheelchair, bedside commode, etc,.)?: Unable Help needed moving to and from a bed to chair (including a wheelchair)?: Total Help needed walking in hospital room?: Total Help needed climbing 3-5 steps with a railing? : Total 6 Click Score: 6    End of Session Equipment Utilized During Treatment: Oxygen Activity Tolerance: Patient tolerated treatment well Patient left: in bed;with call bell/phone within reach;with bed alarm set Nurse Communication: Mobility status PT Visit Diagnosis: Muscle weakness (generalized) (M62.81);Difficulty in walking, not elsewhere classified (R26.2)     Time: 0923-3007 PT Time Calculation (min) (ACUTE ONLY): 12 min  Charges:  $Therapeutic Activity: 8-22 mins                    G Codes:       Lanney Gins, PT, DPT 08/06/17 12:55 PM

## 2017-08-07 LAB — BASIC METABOLIC PANEL
Anion gap: 7 (ref 5–15)
BUN: 49 mg/dL — AB (ref 8–23)
CALCIUM: 8.2 mg/dL — AB (ref 8.9–10.3)
CO2: 25 mmol/L (ref 22–32)
CREATININE: 1.64 mg/dL — AB (ref 0.61–1.24)
Chloride: 107 mmol/L (ref 98–111)
GFR calc Af Amer: 49 mL/min — ABNORMAL LOW (ref >60)
GFR, EST NON AFRICAN AMERICAN: 42 mL/min — AB (ref >60)
Glucose, Bld: 102 mg/dL — ABNORMAL HIGH (ref 70–99)
Potassium: 4.1 mmol/L (ref 3.5–5.1)
SODIUM: 139 mmol/L (ref 135–145)

## 2017-08-07 LAB — GLUCOSE, CAPILLARY
Glucose-Capillary: 115 mg/dL — ABNORMAL HIGH (ref 70–99)
Glucose-Capillary: 174 mg/dL — ABNORMAL HIGH (ref 70–99)

## 2017-08-07 LAB — CBC
HEMATOCRIT: 25.1 % — AB (ref 39.0–52.0)
Hemoglobin: 7.7 g/dL — ABNORMAL LOW (ref 13.0–17.0)
MCH: 27.8 pg (ref 26.0–34.0)
MCHC: 30.7 g/dL (ref 30.0–36.0)
MCV: 90.6 fL (ref 78.0–100.0)
PLATELETS: 160 10*3/uL (ref 150–400)
RBC: 2.77 MIL/uL — ABNORMAL LOW (ref 4.22–5.81)
RDW: 18.3 % — AB (ref 11.5–15.5)
WBC: 5.8 10*3/uL (ref 4.0–10.5)

## 2017-08-07 MED ORDER — MINOXIDIL 10 MG PO TABS: 5.0000 mg | ORAL_TABLET | Freq: Every day | ORAL | 0 refills | Status: DC

## 2017-08-07 MED ORDER — ASPIRIN EC 81 MG PO TBEC: 81.0000 mg | DELAYED_RELEASE_TABLET | Freq: Every day | ORAL | 0 refills | Status: DC

## 2017-08-07 MED ORDER — CLONIDINE 0.2 MG/24HR TD PTWK: 0.2000 mg | MEDICATED_PATCH | TRANSDERMAL | 0 refills | Status: DC

## 2017-08-07 MED ORDER — TORSEMIDE 20 MG PO TABS
20.0000 mg | ORAL_TABLET | Freq: Every day | ORAL | Status: DC
  Administered 2017-08-07: 20 mg via ORAL
  Filled 2017-08-07: qty 1

## 2017-08-07 MED ORDER — TORSEMIDE 20 MG PO TABS: 20.0000 mg | ORAL_TABLET | Freq: Every day | ORAL | 0 refills | Status: DC

## 2017-08-07 MED ORDER — ENSURE ENLIVE PO LIQD: 237.0000 mL | Freq: Three times a day (TID) | ORAL | 12 refills | Status: AC

## 2017-08-07 MED ORDER — INSULIN GLARGINE 100 UNIT/ML ~~LOC~~ SOLN: 24.0000 [IU] | Freq: Every day | SUBCUTANEOUS | 0 refills | Status: DC

## 2017-08-07 MED ORDER — POLYETHYLENE GLYCOL 3350 17 G PO PACK: 17.0000 g | PACK | Freq: Every day | ORAL | 0 refills | Status: AC | PRN

## 2017-08-07 NOTE — Consult Note (Signed)
   [facility] CM Inpatient Consult   08/07/2017  Leonard [name] [birth year] [id number]   Patient screened for extreme high risk for unplanned readmission [place] [facility] services. Patient is in the [place] of the [place] [facility] services under patient's Medicare plan. Spoke with the patient's sister, Leonard [name],[phone],  HIPAA verified.  Spoke with her regarding post hospital needs.  She states the patient has been in the hospital for 5 weeks and he is supposed to return to [facility].  She states she has concerns for him being strong enough to return home.  She provided verbal consent for post hospital follow up for possible transition of care for home needs.  Will have a [employer] Education officer, museum for assistance at the facility.  Sister also state that [name] his niece can also be contacted at [phone] For questions contact:   Natividad Brood, RN BSN [facility] Liaison  [phone] business mobile phone Toll free office [phone]

## 2017-08-07 NOTE — Progress Notes (Signed)
Patient will discharge to [facility]  Anticipated discharge date:7/5 Family notified: left message for pt sister Diplomatic Services operational officer by [employer]- called 2:10pm Report #: [phone]  [place] signing off.  Jorge Ny, LCSW Clinical Social Worker [phone]

## 2017-08-07 NOTE — Plan of Care (Signed)
  Problem: Education: Goal: Knowledge of General Education information will improve Note:  POC reviewed with pt.- pt. seem to understand some information; attention span is short.

## 2017-08-07 NOTE — Progress Notes (Signed)
SATURATION QUALIFICATIONS: (This note is used to comply with regulatory documentation for home oxygen)  Patient Saturations on Room Air at Rest = 87%  Patient Saturations on 2 Liters of oxygen while Ambulating (moving around in bed) = 92%  Please briefly explain why patient needs home oxygen:  Saturations 87% on room air.

## 2017-08-07 NOTE — Progress Notes (Signed)
Leonard [name] to be D/C'd to [facility] per MD order.Report called to Leonard [name] at [facility]. VVS, Skin clean, dry and intact without evidence of skin break down, no evidence of skin tears noted.  IV catheter discontinued intact. Site without signs and symptoms of complications. Dressing and pressure applied.  An After Visit Summary was printed and given to [facility].  Patient escorted via stretcher, and D/C  To [facility] via [facility].  Leonard [name]  08/07/2017 4:31 PM

## 2017-08-07 NOTE — Progress Notes (Signed)
Family Medicine Teaching Service Daily Progress Note Intern Pager: [phone]  [name] Medical record number: [id number] Date of birth: December 23, 1950 Age: 67 y.o. Gender: male  Primary Care Provider: Kathrene Alu, MD Consultants: CCM Code Status: DNR/DNI, BiPAP or NIPPV only    Pt Overview and Major Events to Date:  07/05/17 admitted for acute respiratory failure  07/07/17 transferred to ICU for resp distress 07/25/17 out to [facility] 6/22-6/29 weaning down O2, discontinued NG tube, advancing diet   Assessment and Plan: Leonard [name] is a 67 y.o. male who presented with SOB and developed acute respiratory failure. PMH is significant for COPD, CKD stage 4, CAD s/p CABG, T2DM, HTN, HLD.  Acute hypoxic respiratory failure.  Likely 2/2 CHF exacerbation and steroid responsive pneumonitis of unknown etiology.  Per CCM unknown specific pathology.  Patient had known large right sided pleural effusion that is being followed by weekly CXR.  Had remained stable until follow up CXR on 7/2 showed moderate sized bilateral pleural effusions and questionable nodule in left mid lung, next follow up CXR on 7/9. 7/2-7/4 patient had episodes in the AM when O2 sats dropped to 70s-low 80s.  Corrected with sitting up patient and increasing O2.  Given IV Lasix each day and twice on 7/4.  Given IV Lasix 80mg  x2 on 7/4. Patient continues to diurese well with net I/O over last 24hrs, -1000cc, and Cr continues to improve, 1.64 on 7/5.  Patient has continued to maintain adequate oxygenation overnight and is resting comfortably on 2L with O2 sats at 92. - hold off on thoracentesis - F/U CXR 7/9, monitor bilateral pleural effusions and nodule -s/p ceftaz (6/15-6/19) for HCAP, now monitoring off ABX -monitor fluid status, strict I/Os (on condom cath), daily wts, chart reveals dry weight around 180# -monitor on telemetry - wean O2 as tolerated - on 2L currently, at saturation goal - Restart Torsemide 20 mg  PO daily - f/u ambulating with O2, order placed - D/C back to SNF today  Drowsiness Patient presented with AMS.  Drowsiness has continued to wax and wane throughout admission.  This AM A&O x3.  CT head 7/2 showed no acute findings. CPAP not given last PM.   - continue to monitor  - CPAP ordered, monitor for improvement following CPAP therapy - consider outpatient sleep study - avoid sedating medications   HTN. BP this morning 140/58. Stable. - continue amlodipine 10mg  qd, Coreg 25mg  BID, clonidine 0.2 mg patch, doxazosin 8mg  qhs, hydralazine 100mg  q8h, isosorbide dinitrate 20mg  TID, minoxidil 5mg  QD - prn IV hydralazine for SBP>160 or DBP>100  CKD IV. Cr 2.04 on 6/27, appears to be at baseline ~2.5. Follows with Dr. Marval Regal and has been undergoing preparations to start HD. Continues to improve, 1.64 this AM.   - monitor kidney function  Anemia of chronic disease. Hgb stable at 7.7, has been 7.4-8.6, although patient has received blood transfusions during admission when hemoglobin <7.0.  Patient has history of CAD and therefore threshold to transfuse pRBC should be <8.0. - Continue to monitor Hgb, will hold off on transfusing due to fluid overload and remains stable, patient asymptomatic  - Take ASA 81mg  QD, d/c eliquis. Patient had hx DVT and was adequately treated.  Appropriate to continue on ASA therapy only.  T2DM. a1c 6.9.  On 24 units Lantus and rSSI. CBG 115 this AM. - rSSI - continue Lantus 24U qd - monitor CBGs  HLD. - continue atorvastatin  Hypothyroidism. TSH 3.338 - continue levothyroxine  Gout - continue allopurinol   FEN/GI: dysphagia 2 PPx: heparin SQ  Disposition: pending clinical improvement, back to [facility]  Subjective:  Patient sitting up in bed this AM, sleeping, but easily arousable.  Denies complaints.  Denies CP, SOB.  Objective: Temp:  [98.2 F (36.8 C)-98.6 F (37 C)] 98.4 F (36.9 C) (07/05 0501) Pulse Rate:  [66-70] 69 (07/05  0501) Resp:  [16-18] 16 (07/05 0501) BP: (129-140)/(52-60) 133/52 (07/05 0802) SpO2:  [93 %-98 %] 93 % (07/05 0501)   Physical Exam: General: 67yo male in NAD, sitting up in bed HEENT: NCAT Cardio: RRR no m/r/g Lungs: mild bibasilar crackles R>L, normal work of breathing Abdomen: soft, non-tender to palpation, + bowel sounds MSK: No edema, L AKA Skin: Warm and dry    Laboratory: Recent Labs  Lab 08/05/17 0915 08/06/17 0441 08/07/17 0542  WBC 5.9 5.3 5.8  HGB 8.4* 7.7* 7.7*  HCT 27.1* 25.0* 25.1*  PLT 149* 152 160   Recent Labs  Lab 08/05/17 0915 08/06/17 0441 08/07/17 0542  NA 139 138 139  K 4.6 4.4 4.1  CL 108 110 107  CO2 21* 23 25  BUN 51* 51* 49*  CREATININE 1.69* 1.67* 1.64*  CALCIUM 8.3* 8.0* 8.2*  GLUCOSE 155* 95 102*   Mag 07/25/17: 2.5  Eosinophil count absolute 07/24/17: 0.0   Imaging/Diagnostic Tests: Dg Chest 2 View  Result Date: 08/04/2017 CLINICAL DATA:  History of pleural effusion, follow-up EXAM: CHEST - 2 VIEW COMPARISON:  Portable chest x-ray of 07/29/2017, and CT chest of 07/11/2017 FINDINGS: The lungs are very poorly aerated. There are bilateral pleural effusions present with volume loss at the lung bases and cardiomegaly. There does appear to be a degree of pulmonary vascular congestion, findings consistent with CHF. There is a questionable nodule in left mid lung on this portable film. I did review the CT chest of 07/11/2017 and do not see a nodule although at that time there was considerable volume loss with left effusion present which could obscure a nodule. Attention to this area on follow-up chest x-rays is recommended. IMPRESSION: 1. Poor aeration with moderate-sized bilateral pleural effusions and probable CHF. 2. Questionable nodule in the left mid lung not definitely seen on CT chest 07/11/2017. Recommend attention to this area on follow-up chest x-rays. Electronically Signed   By: Ivar Drape M.D.   On: 08/04/2017 10:05   Ct Head Wo  Contrast  Result Date: 08/05/2017 CLINICAL DATA:  67 y/o  M; concerns for somnolence. EXAM: CT HEAD WITHOUT CONTRAST TECHNIQUE: Contiguous axial images were obtained from the base of the skull through the vertex without intravenous contrast. COMPARISON:  07/18/2017 CT head. FINDINGS: Brain: No evidence of acute infarction, hemorrhage, hydrocephalus, extra-axial collection or mass lesion/mass effect. Stable chronic microvascular ischemic changes and parenchymal volume loss of the brain. 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 Vascular: Calcific atherosclerosis of carotid siphons and the vertebral arteries. Skull: Normal. Negative for fracture or focal lesion. Sinuses/Orbits: Stable trace bilateral mastoid effusions. Left sphenoid sinus opacification. Bilateral intra-ocular lens replacement. Other: None. IMPRESSION: 1. No acute intracranial abnormality identified. 2. Stable chronic microvascular ischemic changes and parenchymal volume loss of the brain. 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 Electronically Signed   By: Kristine Garbe M.D.   On: 08/05/2017 01:28    Rittberger, Bernita Raisin, DO  PGY-1, [place]

## 2017-08-10 ENCOUNTER — Other Ambulatory Visit: Payer: Self-pay | Admitting: Licensed Clinical Social Worker

## 2017-08-10 DIAGNOSIS — I5032 Chronic diastolic (congestive) heart failure: Secondary | ICD-10-CM | POA: Diagnosis not present

## 2017-08-10 DIAGNOSIS — D500 Iron deficiency anemia secondary to blood loss (chronic): Secondary | ICD-10-CM | POA: Diagnosis not present

## 2017-08-10 DIAGNOSIS — E088 Diabetes mellitus due to underlying condition with unspecified complications: Secondary | ICD-10-CM | POA: Diagnosis not present

## 2017-08-10 DIAGNOSIS — I150 Renovascular hypertension: Secondary | ICD-10-CM | POA: Diagnosis not present

## 2017-08-10 NOTE — Patient Outreach (Addendum)
[facility]) Care Management  08/10/2017  ASTOR GENTLE [birth year] [id number]   [facility] CSW received new referral on 08/07/17 with request to follow patient at [facility] during his short term SNF stay. [facility] CSW arrived at patient's room but was informed by roommate that patient was relocated to a different room. [facility] CSW asked a nurse at the nursing station where patient's new room was but was informed that patient was at the ED. [facility] CSW completed chart review and was unable to locate patient in the ED. [facility] CSW completed call to patient's sister [name] later in the day to gain assistance with finding patient. HIPPA verifications provided during phone call. [facility] CSW introduced self, reason for call and of [place]. Sister reports that they did relocate patient to a new room but she is not sure where. She reports that patient did not go to the ED and she is not sure why a nurse informed [facility] CSW that today. [facility] CSW will re-attempt PAC Consult within two weeks and will find out patient's new room number. Sister reports patient has been at [facility] under long term care from February until his ED visit on 07/05/17. Patient lives with his disabled sister who is not mobile as well. Sister reports that family visits patient 2-4 times per week. Sister shares that patient had stable transportation to medical appointments by family members and another transportation service possibly [employer] technical [employer] (family not sure of the name but reports that they use a [employer] for transportation services.) Sister shares that she is unsure as to what patient's current discharge plan will be and look like but that she does not want patient to return home until he is stable. Sister reports that she contacted SNF today to schedule a meeting but has not heard back yet. Sister reports that she feels that patient have to stay at facility under long term care after his short term Medicare days run out as  he may not be safe to go home yet. [name] agreeable to [facility] CSW following patient during his stay at [facility]. [facility] CSW will re-attempt PAC Consult with patient within two weeks.  Eula Fried, BSW, MSW, [email or web address] Phone: [phone] Fax: [phone]

## 2017-08-11 DIAGNOSIS — E039 Hypothyroidism, unspecified: Secondary | ICD-10-CM | POA: Diagnosis not present

## 2017-08-11 DIAGNOSIS — R1312 Dysphagia, oropharyngeal phase: Secondary | ICD-10-CM | POA: Diagnosis not present

## 2017-08-11 DIAGNOSIS — J9621 Acute and chronic respiratory failure with hypoxia: Secondary | ICD-10-CM | POA: Diagnosis not present

## 2017-08-11 DIAGNOSIS — K219 Gastro-esophageal reflux disease without esophagitis: Secondary | ICD-10-CM | POA: Diagnosis not present

## 2017-08-11 DIAGNOSIS — D649 Anemia, unspecified: Secondary | ICD-10-CM | POA: Diagnosis not present

## 2017-08-11 DIAGNOSIS — E088 Diabetes mellitus due to underlying condition with unspecified complications: Secondary | ICD-10-CM | POA: Diagnosis not present

## 2017-08-11 DIAGNOSIS — I480 Paroxysmal atrial fibrillation: Secondary | ICD-10-CM | POA: Diagnosis not present

## 2017-08-11 DIAGNOSIS — I509 Heart failure, unspecified: Secondary | ICD-10-CM | POA: Diagnosis not present

## 2017-08-11 DIAGNOSIS — R41841 Cognitive communication deficit: Secondary | ICD-10-CM | POA: Diagnosis not present

## 2017-08-11 DIAGNOSIS — I10 Essential (primary) hypertension: Secondary | ICD-10-CM | POA: Diagnosis not present

## 2017-08-12 ENCOUNTER — Other Ambulatory Visit: Payer: Self-pay | Admitting: Licensed Clinical Social Worker

## 2017-08-12 DIAGNOSIS — R41841 Cognitive communication deficit: Secondary | ICD-10-CM | POA: Diagnosis not present

## 2017-08-12 DIAGNOSIS — J9621 Acute and chronic respiratory failure with hypoxia: Secondary | ICD-10-CM | POA: Diagnosis not present

## 2017-08-12 DIAGNOSIS — J156 Pneumonia due to other aerobic Gram-negative bacteria: Secondary | ICD-10-CM | POA: Diagnosis not present

## 2017-08-12 DIAGNOSIS — I5033 Acute on chronic diastolic (congestive) heart failure: Secondary | ICD-10-CM | POA: Diagnosis not present

## 2017-08-12 DIAGNOSIS — Z79899 Other long term (current) drug therapy: Secondary | ICD-10-CM | POA: Diagnosis not present

## 2017-08-12 DIAGNOSIS — I509 Heart failure, unspecified: Secondary | ICD-10-CM | POA: Diagnosis not present

## 2017-08-12 DIAGNOSIS — D500 Iron deficiency anemia secondary to blood loss (chronic): Secondary | ICD-10-CM | POA: Diagnosis not present

## 2017-08-12 DIAGNOSIS — D649 Anemia, unspecified: Secondary | ICD-10-CM | POA: Diagnosis not present

## 2017-08-12 DIAGNOSIS — E039 Hypothyroidism, unspecified: Secondary | ICD-10-CM | POA: Diagnosis not present

## 2017-08-12 DIAGNOSIS — R1312 Dysphagia, oropharyngeal phase: Secondary | ICD-10-CM | POA: Diagnosis not present

## 2017-08-12 DIAGNOSIS — K219 Gastro-esophageal reflux disease without esophagitis: Secondary | ICD-10-CM | POA: Diagnosis not present

## 2017-08-12 NOTE — Patient Outreach (Signed)
[facility]) Care Management  08/12/2017  PAWEL SOULES 07/21/1950 [id number]  [facility] CSW arrived at [facility] SNF and was able to find patient in new room #113. Patient able to provide HIPPA verifications successfully but had to be asked several times. Patient not to be very interactive during visit today and had trouble responding to questions asked. Patient shares that his appetite is "good." He reported that he was currently experiencing pain but later stated he wasn't when asked by patient's sister. Patient now has a thick mat by his bed to assist with fall prevention. [facility] CSW completed call to patient's sister with patient's request and was able to reach her successfully. HIPPA verifications provided. Sister reports that she visited patient yesterday and feels like he's condition is declining at SNF instead of improving and feels that patient may have to be re-admitted. Sister reports that patient is usually more interactive than he is lately which is concerning to her as well. Sister reports that she will be coming to visit patient at SNF later in the afternoon to check on him and his needs. She has agreed to update [facility] CSW in the case that patient is re-admitted to hospital. Discussion with daughter on patient's long term plan and the fact that [facility] would get back involved if his plans were to discharge back home eventually. [facility] CSW will follow up with SNF within two weeks.  Eula Fried, BSW, MSW, [email or web address] Phone: [phone] Fax: [phone]

## 2017-08-13 ENCOUNTER — Encounter (HOSPITAL_COMMUNITY): Payer: Self-pay

## 2017-08-13 ENCOUNTER — Emergency Department (HOSPITAL_COMMUNITY): Payer: Medicare Other

## 2017-08-13 ENCOUNTER — Other Ambulatory Visit: Payer: Self-pay

## 2017-08-13 ENCOUNTER — Inpatient Hospital Stay (HOSPITAL_COMMUNITY)
Admission: EM | Admit: 2017-08-13 | Discharge: 2017-08-14 | DRG: 194 | Disposition: A | Discharge status: Skilled Nursing Facility | Payer: Medicare Other | Source: Skilled Nursing Facility | Attending: Family Medicine | Admitting: Family Medicine

## 2017-08-13 DIAGNOSIS — I10 Essential (primary) hypertension: Secondary | ICD-10-CM | POA: Diagnosis not present

## 2017-08-13 DIAGNOSIS — E1151 Type 2 diabetes mellitus with diabetic peripheral angiopathy without gangrene: Secondary | ICD-10-CM | POA: Diagnosis not present

## 2017-08-13 DIAGNOSIS — E11319 Type 2 diabetes mellitus with unspecified diabetic retinopathy without macular edema: Secondary | ICD-10-CM | POA: Diagnosis not present

## 2017-08-13 DIAGNOSIS — J189 Pneumonia, unspecified organism: Secondary | ICD-10-CM | POA: Diagnosis not present

## 2017-08-13 DIAGNOSIS — Z79890 Hormone replacement therapy (postmenopausal): Secondary | ICD-10-CM | POA: Diagnosis not present

## 2017-08-13 DIAGNOSIS — I130 Hypertensive heart and chronic kidney disease with heart failure and stage 1 through stage 4 chronic kidney disease, or unspecified chronic kidney disease: Secondary | ICD-10-CM | POA: Diagnosis not present

## 2017-08-13 DIAGNOSIS — J440 Chronic obstructive pulmonary disease with acute lower respiratory infection: Secondary | ICD-10-CM | POA: Diagnosis present

## 2017-08-13 DIAGNOSIS — Z87891 Personal history of nicotine dependence: Secondary | ICD-10-CM

## 2017-08-13 DIAGNOSIS — Z89612 Acquired absence of left leg above knee: Secondary | ICD-10-CM | POA: Diagnosis not present

## 2017-08-13 DIAGNOSIS — R0902 Hypoxemia: Secondary | ICD-10-CM | POA: Diagnosis present

## 2017-08-13 DIAGNOSIS — N184 Chronic kidney disease, stage 4 (severe): Secondary | ICD-10-CM | POA: Diagnosis not present

## 2017-08-13 DIAGNOSIS — N400 Enlarged prostate without lower urinary tract symptoms: Secondary | ICD-10-CM | POA: Diagnosis present

## 2017-08-13 DIAGNOSIS — Y95 Nosocomial condition: Secondary | ICD-10-CM | POA: Diagnosis present

## 2017-08-13 DIAGNOSIS — J9690 Respiratory failure, unspecified, unspecified whether with hypoxia or hypercapnia: Secondary | ICD-10-CM | POA: Diagnosis present

## 2017-08-13 DIAGNOSIS — R0602 Shortness of breath: Secondary | ICD-10-CM

## 2017-08-13 DIAGNOSIS — D472 Monoclonal gammopathy: Secondary | ICD-10-CM | POA: Diagnosis not present

## 2017-08-13 DIAGNOSIS — Z8249 Family history of ischemic heart disease and other diseases of the circulatory system: Secondary | ICD-10-CM

## 2017-08-13 DIAGNOSIS — E1122 Type 2 diabetes mellitus with diabetic chronic kidney disease: Secondary | ICD-10-CM | POA: Diagnosis present

## 2017-08-13 DIAGNOSIS — J9621 Acute and chronic respiratory failure with hypoxia: Secondary | ICD-10-CM | POA: Diagnosis not present

## 2017-08-13 DIAGNOSIS — Z951 Presence of aortocoronary bypass graft: Secondary | ICD-10-CM

## 2017-08-13 DIAGNOSIS — R41841 Cognitive communication deficit: Secondary | ICD-10-CM | POA: Diagnosis not present

## 2017-08-13 DIAGNOSIS — Z85828 Personal history of other malignant neoplasm of skin: Secondary | ICD-10-CM | POA: Diagnosis not present

## 2017-08-13 DIAGNOSIS — M2550 Pain in unspecified joint: Secondary | ICD-10-CM | POA: Diagnosis not present

## 2017-08-13 DIAGNOSIS — E1142 Type 2 diabetes mellitus with diabetic polyneuropathy: Secondary | ICD-10-CM | POA: Diagnosis not present

## 2017-08-13 DIAGNOSIS — I2721 Secondary pulmonary arterial hypertension: Secondary | ICD-10-CM | POA: Diagnosis not present

## 2017-08-13 DIAGNOSIS — E039 Hypothyroidism, unspecified: Secondary | ICD-10-CM | POA: Diagnosis not present

## 2017-08-13 DIAGNOSIS — I509 Heart failure, unspecified: Secondary | ICD-10-CM | POA: Diagnosis not present

## 2017-08-13 DIAGNOSIS — Z794 Long term (current) use of insulin: Secondary | ICD-10-CM | POA: Diagnosis not present

## 2017-08-13 DIAGNOSIS — I2510 Atherosclerotic heart disease of native coronary artery without angina pectoris: Secondary | ICD-10-CM | POA: Diagnosis present

## 2017-08-13 DIAGNOSIS — Z880 Allergy status to penicillin: Secondary | ICD-10-CM

## 2017-08-13 DIAGNOSIS — I5032 Chronic diastolic (congestive) heart failure: Secondary | ICD-10-CM | POA: Diagnosis not present

## 2017-08-13 DIAGNOSIS — E782 Mixed hyperlipidemia: Secondary | ICD-10-CM | POA: Diagnosis not present

## 2017-08-13 DIAGNOSIS — Z7982 Long term (current) use of aspirin: Secondary | ICD-10-CM | POA: Diagnosis not present

## 2017-08-13 DIAGNOSIS — R062 Wheezing: Secondary | ICD-10-CM | POA: Diagnosis not present

## 2017-08-13 DIAGNOSIS — Z7401 Bed confinement status: Secondary | ICD-10-CM | POA: Diagnosis not present

## 2017-08-13 DIAGNOSIS — K219 Gastro-esophageal reflux disease without esophagitis: Secondary | ICD-10-CM | POA: Diagnosis not present

## 2017-08-13 DIAGNOSIS — R1312 Dysphagia, oropharyngeal phase: Secondary | ICD-10-CM | POA: Diagnosis not present

## 2017-08-13 HISTORY — DX: Chronic kidney disease, stage 4 (severe): N18.4

## 2017-08-13 HISTORY — DX: Respiratory failure, unspecified, unspecified whether with hypoxia or hypercapnia: J96.90

## 2017-08-13 LAB — CBC WITH DIFFERENTIAL/PLATELET
ABS IMMATURE GRANULOCYTES: 0 10*3/uL (ref 0.0–0.1)
BASOS ABS: 0 10*3/uL (ref 0.0–0.1)
BASOS PCT: 0 %
EOS ABS: 0.1 10*3/uL (ref 0.0–0.7)
Eosinophils Relative: 2 %
HCT: 27.2 % — ABNORMAL LOW (ref 39.0–52.0)
Hemoglobin: 8.3 g/dL — ABNORMAL LOW (ref 13.0–17.0)
IMMATURE GRANULOCYTES: 0 %
Lymphocytes Relative: 10 %
Lymphs Abs: 0.5 10*3/uL — ABNORMAL LOW (ref 0.7–4.0)
MCH: 27.6 pg (ref 26.0–34.0)
MCHC: 30.5 g/dL (ref 30.0–36.0)
MCV: 90.4 fL (ref 78.0–100.0)
Monocytes Absolute: 0.3 10*3/uL (ref 0.1–1.0)
Monocytes Relative: 6 %
NEUTROS PCT: 82 %
Neutro Abs: 4.2 10*3/uL (ref 1.7–7.7)
PLATELETS: 113 10*3/uL — AB (ref 150–400)
RBC: 3.01 MIL/uL — ABNORMAL LOW (ref 4.22–5.81)
RDW: 17.5 % — AB (ref 11.5–15.5)
WBC: 5.1 10*3/uL (ref 4.0–10.5)

## 2017-08-13 LAB — COMPREHENSIVE METABOLIC PANEL
ALBUMIN: 3 g/dL — AB (ref 3.5–5.0)
ALT: 11 U/L (ref 0–44)
AST: 13 U/L — AB (ref 15–41)
Alkaline Phosphatase: 79 U/L (ref 38–126)
Anion gap: 10 (ref 5–15)
BUN: 23 mg/dL (ref 8–23)
CHLORIDE: 110 mmol/L (ref 98–111)
CO2: 22 mmol/L (ref 22–32)
Calcium: 8.5 mg/dL — ABNORMAL LOW (ref 8.9–10.3)
Creatinine, Ser: 1.93 mg/dL — ABNORMAL HIGH (ref 0.61–1.24)
GFR calc Af Amer: 40 mL/min — ABNORMAL LOW (ref >60)
GFR, EST NON AFRICAN AMERICAN: 35 mL/min — AB (ref >60)
GLUCOSE: 216 mg/dL — AB (ref 70–99)
POTASSIUM: 4.3 mmol/L (ref 3.5–5.1)
SODIUM: 142 mmol/L (ref 135–145)
Total Bilirubin: 0.5 mg/dL (ref 0.3–1.2)
Total Protein: 5.6 g/dL — ABNORMAL LOW (ref 6.5–8.1)

## 2017-08-13 LAB — I-STAT ARTERIAL BLOOD GAS, ED
ACID-BASE DEFICIT: 2 mmol/L (ref 0.0–2.0)
Bicarbonate: 21.8 mmol/L (ref 20.0–28.0)
O2 SAT: 77 %
PCO2 ART: 33.5 mmHg (ref 32.0–48.0)
PO2 ART: 39 mmHg — AB (ref 83.0–108.0)
Patient temperature: 97.8
TCO2: 23 mmol/L (ref 22–32)
pH, Arterial: 7.421 (ref 7.350–7.450)

## 2017-08-13 LAB — URINALYSIS, ROUTINE W REFLEX MICROSCOPIC
BILIRUBIN URINE: NEGATIVE
Glucose, UA: NEGATIVE mg/dL
KETONES UR: NEGATIVE mg/dL
Nitrite: NEGATIVE
PH: 5 (ref 5.0–8.0)
Protein, ur: 100 mg/dL — AB
SPECIFIC GRAVITY, URINE: 1.011 (ref 1.005–1.030)

## 2017-08-13 LAB — BRAIN NATRIURETIC PEPTIDE: B NATRIURETIC PEPTIDE 5: 334.1 pg/mL — AB (ref 0.0–100.0)

## 2017-08-13 LAB — MRSA PCR SCREENING: MRSA BY PCR: NEGATIVE

## 2017-08-13 LAB — GLUCOSE, CAPILLARY
GLUCOSE-CAPILLARY: 243 mg/dL — AB (ref 70–99)
Glucose-Capillary: 276 mg/dL — ABNORMAL HIGH (ref 70–99)

## 2017-08-13 LAB — I-STAT TROPONIN, ED: Troponin i, poc: 0.02 ng/mL (ref 0.00–0.08)

## 2017-08-13 MED ORDER — ATORVASTATIN CALCIUM 40 MG PO TABS
40.0000 mg | ORAL_TABLET | Freq: Every day | ORAL | Status: DC
  Administered 2017-08-13 – 2017-08-14 (×2): 40 mg via ORAL
  Filled 2017-08-13 (×3): qty 1

## 2017-08-13 MED ORDER — ALBUTEROL SULFATE (2.5 MG/3ML) 0.083% IN NEBU
5.0000 mg | INHALATION_SOLUTION | Freq: Once | RESPIRATORY_TRACT | Status: AC
  Administered 2017-08-13: 5 mg via RESPIRATORY_TRACT
  Filled 2017-08-13: qty 6

## 2017-08-13 MED ORDER — IPRATROPIUM-ALBUTEROL 0.5-2.5 (3) MG/3ML IN SOLN
3.0000 mL | Freq: Four times a day (QID) | RESPIRATORY_TRACT | Status: DC
  Administered 2017-08-13: 3 mL via RESPIRATORY_TRACT
  Filled 2017-08-13: qty 3

## 2017-08-13 MED ORDER — INSULIN ASPART 100 UNIT/ML ~~LOC~~ SOLN
0.0000 [IU] | Freq: Three times a day (TID) | SUBCUTANEOUS | Status: DC
  Administered 2017-08-13: 3 [IU] via SUBCUTANEOUS

## 2017-08-13 MED ORDER — ISOSORBIDE MONONITRATE ER 60 MG PO TB24
60.0000 mg | ORAL_TABLET | Freq: Every day | ORAL | Status: DC
  Administered 2017-08-13 – 2017-08-14 (×2): 60 mg via ORAL
  Filled 2017-08-13 (×2): qty 1

## 2017-08-13 MED ORDER — NITROGLYCERIN 2 % TD OINT
0.5000 [in_us] | TOPICAL_OINTMENT | Freq: Once | TRANSDERMAL | Status: AC
  Administered 2017-08-13: 0.5 [in_us] via TOPICAL
  Filled 2017-08-13: qty 1

## 2017-08-13 MED ORDER — AMLODIPINE BESYLATE 10 MG PO TABS
10.0000 mg | ORAL_TABLET | Freq: Every day | ORAL | Status: DC
  Administered 2017-08-13 – 2017-08-14 (×2): 10 mg via ORAL
  Filled 2017-08-13 (×2): qty 1

## 2017-08-13 MED ORDER — ALLOPURINOL 100 MG PO TABS
100.0000 mg | ORAL_TABLET | Freq: Every day | ORAL | Status: DC
  Administered 2017-08-13 – 2017-08-14 (×2): 100 mg via ORAL
  Filled 2017-08-13 (×2): qty 1

## 2017-08-13 MED ORDER — IPRATROPIUM-ALBUTEROL 0.5-2.5 (3) MG/3ML IN SOLN
3.0000 mL | Freq: Three times a day (TID) | RESPIRATORY_TRACT | Status: DC
  Administered 2017-08-14 (×2): 3 mL via RESPIRATORY_TRACT
  Filled 2017-08-13 (×2): qty 3

## 2017-08-13 MED ORDER — ASPIRIN EC 81 MG PO TBEC
81.0000 mg | DELAYED_RELEASE_TABLET | Freq: Every day | ORAL | Status: DC
  Administered 2017-08-13 – 2017-08-14 (×2): 81 mg via ORAL
  Filled 2017-08-13 (×2): qty 1

## 2017-08-13 MED ORDER — LEVOFLOXACIN IN D5W 750 MG/150ML IV SOLN
750.0000 mg | INTRAVENOUS | Status: DC
  Filled 2017-08-13: qty 150

## 2017-08-13 MED ORDER — PANTOPRAZOLE SODIUM 40 MG PO TBEC
40.0000 mg | DELAYED_RELEASE_TABLET | Freq: Every day | ORAL | Status: DC
  Administered 2017-08-13 – 2017-08-14 (×2): 40 mg via ORAL
  Filled 2017-08-13 (×2): qty 1

## 2017-08-13 MED ORDER — IPRATROPIUM-ALBUTEROL 0.5-2.5 (3) MG/3ML IN SOLN: 3.0000 mL | Freq: Four times a day (QID) | RESPIRATORY_TRACT | Status: DC

## 2017-08-13 MED ORDER — LEVOFLOXACIN IN D5W 750 MG/150ML IV SOLN: 750.0000 mg | INTRAVENOUS | Status: DC

## 2017-08-13 MED ORDER — NITROGLYCERIN 0.4 MG SL SUBL: 0.4000 mg | SUBLINGUAL_TABLET | SUBLINGUAL | Status: DC | PRN

## 2017-08-13 MED ORDER — ACETAMINOPHEN 325 MG PO TABS: 650.0000 mg | ORAL_TABLET | Freq: Four times a day (QID) | ORAL | Status: DC | PRN

## 2017-08-13 MED ORDER — METHYLPREDNISOLONE SODIUM SUCC 125 MG IJ SOLR
125.0000 mg | Freq: Two times a day (BID) | INTRAMUSCULAR | Status: DC
  Administered 2017-08-13 – 2017-08-14 (×2): 125 mg via INTRAVENOUS
  Filled 2017-08-13 (×3): qty 2

## 2017-08-13 MED ORDER — POLYETHYLENE GLYCOL 3350 17 G PO PACK: 17.0000 g | PACK | Freq: Every day | ORAL | Status: DC | PRN

## 2017-08-13 MED ORDER — INSULIN ASPART 100 UNIT/ML ~~LOC~~ SOLN
0.0000 [IU] | Freq: Every day | SUBCUTANEOUS | Status: DC
  Administered 2017-08-13: 3 [IU] via SUBCUTANEOUS

## 2017-08-13 MED ORDER — FUROSEMIDE 10 MG/ML IJ SOLN
40.0000 mg | Freq: Once | INTRAMUSCULAR | Status: AC
  Administered 2017-08-13: 40 mg via INTRAVENOUS
  Filled 2017-08-13: qty 4

## 2017-08-13 MED ORDER — TORSEMIDE 20 MG PO TABS
20.0000 mg | ORAL_TABLET | Freq: Two times a day (BID) | ORAL | Status: DC
  Administered 2017-08-13 – 2017-08-14 (×3): 20 mg via ORAL
  Filled 2017-08-13 (×4): qty 1

## 2017-08-13 MED ORDER — CARVEDILOL 25 MG PO TABS
50.0000 mg | ORAL_TABLET | Freq: Two times a day (BID) | ORAL | Status: DC
  Administered 2017-08-13 – 2017-08-14 (×2): 50 mg via ORAL
  Filled 2017-08-13 (×2): qty 2

## 2017-08-13 MED ORDER — INSULIN ASPART 100 UNIT/ML ~~LOC~~ SOLN
0.0000 [IU] | Freq: Three times a day (TID) | SUBCUTANEOUS | Status: DC
  Administered 2017-08-14 (×3): 3 [IU] via SUBCUTANEOUS

## 2017-08-13 MED ORDER — DOXAZOSIN MESYLATE 2 MG PO TABS
8.0000 mg | ORAL_TABLET | Freq: Every evening | ORAL | Status: DC
  Administered 2017-08-13 – 2017-08-14 (×2): 8 mg via ORAL
  Filled 2017-08-13 (×3): qty 4

## 2017-08-13 MED ORDER — ENOXAPARIN SODIUM 40 MG/0.4ML ~~LOC~~ SOLN
40.0000 mg | SUBCUTANEOUS | Status: DC
  Administered 2017-08-13 – 2017-08-14 (×2): 40 mg via SUBCUTANEOUS
  Filled 2017-08-13 (×2): qty 0.4

## 2017-08-13 MED ORDER — ALBUTEROL SULFATE (2.5 MG/3ML) 0.083% IN NEBU: 2.5000 mg | INHALATION_SOLUTION | RESPIRATORY_TRACT | Status: DC | PRN

## 2017-08-13 MED ORDER — LEVOTHYROXINE SODIUM 25 MCG PO TABS
125.0000 ug | ORAL_TABLET | Freq: Every day | ORAL | Status: DC
  Administered 2017-08-14: 125 ug via ORAL
  Filled 2017-08-13: qty 1

## 2017-08-13 NOTE — ED Triage Notes (Signed)
Pt from nursing home via EMS; staff noticed labored breathing this am, satting 83% on normal 3L; duoneb given at 0830, no improvement; recent diagnosis pneumonia; hx  diabetes, HTN, CHF; dialysis T, TH, Sat, has not gone today; pt  given 125 solu medrol and additional breathing treatment PTA  CBG 119 BP 190/100 96% w/ duoneb at 8L resp 22 HR 88

## 2017-08-13 NOTE — ED Notes (Signed)
MD notified of upward trending blood pressures

## 2017-08-13 NOTE — ED Notes (Signed)
ABG results given to Dr. Francia Greaves

## 2017-08-13 NOTE — Progress Notes (Signed)
PHARMACY NOTE:  ANTIMICROBIAL RENAL DOSAGE ADJUSTMENT  Current antimicrobial regimen includes a mismatch between antimicrobial dosage and estimated renal function.  As per policy approved by the Pharmacy & Therapeutics and Medical Executive Committees, the antimicrobial dosage will be adjusted accordingly.  Current antimicrobial dosage:  Levofloxacin 750 mg IV q24hr  Indication: Started as an outpatient  Renal Function:  Estimated Creatinine Clearance: 36.4 mL/min (A) (by C-G formula based on SCr of 1.93 mg/dL (H)). []      On intermittent HD, scheduled: []      On CRRT    Antimicrobial dosage has been changed to:  Levofloxacin 750 mg IV q48hr   Thank you for allowing pharmacy to be a part of this patient's care.  Corinda Gubler, [facility] 08/13/2017 4:44 PM

## 2017-08-13 NOTE — ED Notes (Signed)
Pt returned from x-ray; RT at bedside

## 2017-08-13 NOTE — ED Notes (Signed)
Attempted report 

## 2017-08-13 NOTE — ED Notes (Signed)
Patient transported to X-ray 

## 2017-08-13 NOTE — ED Provider Notes (Signed)
[facility] EMERGENCY DEPARTMENT Provider Note   CSN: [id number] Arrival date & time: 08/13/17  1017     History   Chief Complaint Chief Complaint  Patient presents with  . Shortness of Breath    HPI Leonard [name] is a 67 y.o. male.  67 year old male with prior history significant for chronic kidney disease, CAD, CHF, hypertension, COPD presents with complaint of shortness of breath.  Patient reportedly had increased shortness of breath earlier this morning.  EMS reports that his saturations on 2 L nasal cannula were in the low 80s.  EMS gave 125 mg of Solu-Medrol and 1 DuoNeb treatment in route to the ED.  Patient reports that he feels improved upon arrival to the ED.  His sats on 4 L nasal cannula are in the low 90s.  He does not appear to be in significant respiratory distress.  He denies associated chest pain or fever.  Of note, patient appears to be currently on antibiotics for possible pneumonia.  The history is provided by the patient and medical records.  Shortness of Breath  This is a recurrent problem. The problem occurs frequently.The current episode started 3 to 5 hours ago. The problem has been gradually improving. He has tried beta-agonist inhalers for the symptoms. He has had prior hospitalizations. He has had prior ED visits. He has had prior ICU admissions.    Past Medical History:  Diagnosis Date  . Cancer ([place])    skin cancer-  . CHF (congestive heart failure) ([place])   . Chronic kidney disease    not on dialysis  . Coronary artery disease   . Gangrene of toe ([place])    right 5th/notes 01/04/2013   . High cholesterol   . Hypertension   . Iron deficiency anemia 08/03/2015  . 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 PAD (peripheral artery disease) ([place])   . Shortness of breath dyspnea    with exertion  . Type II diabetes mellitus ([place])    Type II    Patient Active Problem List   Diagnosis Date Noted  .  Respiratory failure ([place]) 08/13/2017  . Pneumonitis 07/26/2017  . Pulmonary artery hypertension ([place]) 07/25/2017  . HCAP (healthcare-associated pneumonia)   . Pressure injury of skin 07/20/2017  . Hypernatremia   . Acute exacerbation of CHF (congestive heart failure) ([place]) 07/05/2017  . Atherosclerosis of aorta with gangrene ([place]) 03/12/2017  . Atherosclerotic peripheral vascular disease with gangrene ([place]) 02/14/2017  . Encounter for vitamin deficiency screening 02/11/2017  . Collapse of left talus 01/14/2017  . Diabetic polyneuropathy associated with type 2 diabetes mellitus ([place]) 01/14/2017  . 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 Idiopathic chronic gout of left foot without tophus   . Left foot pain 10/29/2016  . Long term (current) use of anticoagulants [Z79.01] 09/09/2016  . Chronic deep vein thrombosis (DVT) of femoral vein of left lower extremity ([place]) 09/03/2016  . Pleural effusion   . Morbid obesity due to excess calories ([place]) 03/17/2016  . Chronic anemia 02/28/2016  . Fatigue associated with anemia 01/21/2016  . Diabetic retinopathy ([place]) 11/13/2015  . CHF (congestive heart failure) ([place]) 08/27/2015  . Iron deficiency anemia 08/03/2015  . Claudication ([place])   . Diabetes ([place])   . Hematuria 06/27/2015  . CKD (chronic kidney disease) stage 3, GFR 30-59 ml/min (HCC)   . Skin lesion of face 02/22/2015  . 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 Deficiency anemia  01/15/2015  . CKD (chronic kidney disease) stage 4, GFR 15-29 ml/min (HCC) 01/15/2015  . Hypothyroidism 11/22/2014  . Acute on chronic diastolic congestive heart failure ([place]) 07/04/2014  . COPD GOLD III  02/16/2014  . Acute respiratory failure with hypoxia ([place])   . ARDS (adult respiratory distress syndrome) ([place]) 02/10/2014  . History of tobacco use 08/23/2013  . 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 CAD (coronary artery disease) 04/06/2013  . PVD - hx of Rt SFA PTA  and s/p Rt 4-5th toe amp Dec 2014 04/05/2013  . Essential hypertension 11/25/2012  . Mixed hyperlipidemia 07/17/2008    Past Surgical History:  Procedure Laterality Date  . ABDOMINAL AORTOGRAM W/LOWER EXTREMITY N/A 03/12/2017   Procedure: ABDOMINAL AORTOGRAM W/LOWER EXTREMITY;  Surgeon: Conrad Surfside, MD;  Location: [facility];  Service: Cardiovascular;  Laterality: N/A;  . AMPUTATION Right 01/28/2013   Procedure: RAY AMPUTATION RIGHT  4th & 5th TOE;  Surgeon: Rosetta Posner, MD;  Location: [facility];  Service: Vascular;  Laterality: Right;  . AMPUTATION Left 03/15/2017   Procedure: AMPUTATION ABOVE KNEE;  Surgeon: Waynetta Sandy, MD;  Location: [facility];  Service: Vascular;  Laterality: Left;  . ANGIOPLASTY / STENTING FEMORAL Right 01/13/2013   superficial femoral artery x 2 (3 mm x 60 mm, 4 mm x 60 mm)  . AV FISTULA PLACEMENT Left 02/11/2016   Procedure: LEFT UPPER ARM BRACHIAL CEPHALIC ARTERIOVENOUS (AV) FISTULA CREATION;  Surgeon: Conrad [place]os, MD;  Location: [facility];  Service: Vascular;  Laterality: Left;  . COLONOSCOPY W/ POLYPECTOMY    . CORONARY ARTERY BYPASS GRAFT N/A 04/07/2013   Procedure: CORONARY ARTERY BYPASS GRAFTING (CABG);  Surgeon: Ivin Poot, MD;  Location: [facility];  Service: Open Heart Surgery;  Laterality: N/A;  . INTRAOPERATIVE TRANSESOPHAGEAL ECHOCARDIOGRAM N/A 04/07/2013   Procedure: INTRAOPERATIVE TRANSESOPHAGEAL ECHOCARDIOGRAM;  Surgeon: Ivin Poot, MD;  Location: [facility];  Service: Open Heart Surgery;  Laterality: N/A;  . LEFT HEART CATHETERIZATION WITH CORONARY ANGIOGRAM N/A 04/05/2013   Procedure: LEFT HEART CATHETERIZATION WITH CORONARY ANGIOGRAM;  Surgeon: Peter M Martinique, MD;  Location: [facility] CATH LAB;  Service: Cardiovascular;  Laterality: N/A;  . LOWER EXTREMITY ANGIOGRAM Bilateral 01/06/2013   Procedure: LOWER EXTREMITY ANGIOGRAM;  Surgeon: Conrad Lochsloy, MD;  Location[facility] CATH LAB;  Service: Cardiovascular;  Laterality: Bilateral;  . LOWER EXTREMITY  ANGIOGRAM Right 01/13/2013   Procedure: LOWER EXTREMITY ANGIOGRAM;  Surgeon: Conrad [place], MD;  Location: [facility] CATH LAB;  Service: Cardiovascular;  Laterality: Right;  . SKIN CANCER EXCISION  2017  . THROAT SURGERY  1983   polyps  . TONSILLECTOMY AND ADENOIDECTOMY  ~ 1965        Home Medications    Prior to Admission medications   Medication Sig Start Date End Date Taking? Authorizing Provider  allopurinol (ZYLOPRIM) 100 MG tablet Take 100 mg by mouth daily. 05/04/17   [provider]  amLODipine (NORVASC) 10 MG tablet Take 1 tablet (10 mg total) by mouth daily. Needs office visit for further refills 04/08/17   Bensimhon, Shaune Pascal, MD  aspirin EC 81 MG tablet Take 1 tablet (81 mg total) by mouth daily. 08/07/17 08/07/18  Steve Rattler, DO  atorvastatin (LIPITOR) 40 MG tablet TAKE ONE (1) TABLET BY MOUTH EVERY DAY AT 6:00PM Patient taking differently: Take 40 mg by mouth once a day at 6 PM 11/17/16   Larey Dresser, MD  Blood Glucose Monitoring Suppl ([place])  w/Device KIT 1 kit by Does not apply route daily. 10/10/16   McDiarmid, Blane Ohara, MD  carvedilol (COREG) 25 MG tablet TAKE TWO (2) TABLETS BY MOUTH 2 TIMES DAILY WITH A MEAL Patient taking differently: TAKE TWO TABLETS (70m) BY MOUTH 2 TIMES DAILY WITH A MEAL 01/14/17   Bensimhon, DShaune Pascal MD  cloNIDine (CATAPRES - DOSED IN MG/24 HR) 0.2 mg/24hr patch Place 1 patch (0.2 mg total) onto the skin once a week. 08/09/17   RSteve Rattler DO  doxazosin (CARDURA) 8 MG tablet Take 1 tablet (8 mg total) by mouth every evening. Patient taking differently: Take 8 mg by mouth at bedtime.  12/01/16   HZenia Resides MD  feeding supplement, ENSURE ENLIVE, (ENSURE ENLIVE) LIQD Take 237 mLs by mouth 3 (three) times daily between meals. 08/07/17   Riccio, Angela C, DO  FEROSUL 325 (65 Fe) MG tablet TAKE ONE (1) TABLET BY MOUTH TWO (2) TIMES DAILY WITH A MEAL Patient taking differently: TAKE 325 MG BY MOUTH TWO (2) TIMES DAILY  WITH A MEAL 03/03/17   GCarlyle Dolly MD  glucose blood (ONETOUCH VERIO) test strip Use as instructed to test three times daily. ICD-10 code: E11.22 10/15/16   McDiarmid, TBlane Ohara MD  hydrALAZINE (APRESOLINE) 100 MG tablet TAKE ONE TABLET BY MOUTH EVERY EIGHT HOURS Patient taking differently: No sig reported 11/14/16   GCarlyle Dolly MD  insulin aspart (NOVOLOG FLEXPEN) 100 UNIT/ML FlexPen Inject 15 Units 2 (two) times daily before lunch and supper into the skin. 12/15/16   HZenia Resides MD  insulin glargine (LANTUS) 100 UNIT/ML injection Inject 0.24 mLs (24 Units total) into the skin daily. 08/08/17   RSteve Rattler DO  ipratropium-albuterol (DUONEB) 0.5-2.5 (3) MG/3ML SOLN Take 3 mLs by nebulization 4 (four) times daily.    [provider]  isosorbide mononitrate (IMDUR) 60 MG 24 hr tablet TAKE ONE (1) TABLET BY MOUTH EVERY DAY Patient taking differently: TAKE ONE TABLET (635m BY MOUTH EVERY DAY 01/14/17   Bensimhon, DaShaune PascalMD  levothyroxine (SYNTHROID, LEVOTHROID) 100 MCG tablet Take 100 mcg by mouth daily before breakfast.    [provider]  minoxidil (LONITEN) 10 MG tablet Take 0.5 tablets (5 mg total) by mouth daily. 08/08/17   RiSteve RattlerDO  nitroGLYCERIN (NITROSTAT) 0.4 MG SL tablet Place 1 tablet (0.4 mg total) under the tongue every 5 (five) minutes as needed for chest pain. 11/14/16   GaCarlyle DollyMD  ONMercy Regional Medical CenterELICA LANCETS 3374QISC 1 each by Does not apply route 3 (three) times daily. ICD-10 code: E11.22 10/15/16   McDiarmid, ToBlane OharaMD  pantoprazole (PROTONIX) 40 MG tablet TAKE ONE (1) TABLET BY MOUTH EVERY DAY Patient taking differently: TAKE ONE TABLET (4067mBY MOUTH EVERY DAY 01/13/17   GamCarlyle DollyD  polyethylene glycol (MIRALAX / GLYCOLAX) packet Take 17 g by mouth daily as needed for mild constipation or moderate constipation. 08/07/17   RicSteve RattlerO  torsemide (DEMADEX) 20 MG tablet Take 1 tablet (20 mg total)  by mouth daily. 08/08/17   RicSteve RattlerO    Family History Family History  Problem Relation Age of Onset  . Cancer Mother        lung cancer  . Hypertension Mother   . Cancer Sister        patient thinks it was uterine cancer  . Hypertension Sister   . Heart attack Sister   . Stroke Neg  Hx     Social History Social History   Tobacco Use  . Smoking status: Former Smoker    Packs/day: 1.00    Years: 46.00    Pack years: 46.00    Types: Cigarettes    Start date: 02/04/1967    Last attempt to quit: 02/03/2013    Years since quitting: 4.5  . Smokeless tobacco: Never Used  . Tobacco comment: Doing well with quitting.  Substance Use Topics  . Alcohol use: No    Alcohol/week: 0.0 oz  . Drug use: No     Allergies   Penicillins   Review of Systems Review of Systems  Respiratory: Positive for shortness of breath.   All other systems reviewed and are negative.    Physical Exam Updated Vital Signs BP (!) 155/68   Pulse 84   Temp 97.8 F (36.6 C) (Rectal)   Resp 18   Ht '5\' 8"'  (1.727 m)   Wt 79.4 kg (175 lb)   SpO2 98%   BMI 26.61 kg/m   Physical Exam  Constitutional: He is oriented to person, place, and time. He appears well-developed and well-nourished. No distress.  HENT:  Head: Normocephalic and atraumatic.  Mouth/Throat: Oropharynx is clear and moist.  Eyes: Pupils are equal, round, and reactive to light. Conjunctivae and EOM are normal.  Neck: Normal range of motion. Neck supple.  Cardiovascular: Normal rate, regular rhythm and normal heart sounds.  Pulmonary/Chest: Effort normal. No respiratory distress.  Decreased breath sounds at bilateral bases.  Abdominal: Soft. He exhibits no distension. There is no tenderness.  Musculoskeletal: Normal range of motion. He exhibits no edema or deformity.  Neurological: He is alert and oriented to person, place, and time.  Skin: Skin is warm and dry.  Psychiatric: He has a normal mood and affect.  Nursing note  and vitals reviewed.    ED Treatments / Results  Labs (all labs ordered are listed, but only abnormal results are displayed) Labs Reviewed  CBC WITH DIFFERENTIAL/PLATELET - Abnormal; Notable for the following components:      Result Value   RBC 3.01 (*)    Hemoglobin 8.3 (*)    HCT 27.2 (*)    RDW 17.5 (*)    Platelets 113 (*)    Lymphs Abs 0.5 (*)    All other components within normal limits  COMPREHENSIVE METABOLIC PANEL - Abnormal; Notable for the following components:   Glucose, Bld 216 (*)    Creatinine, Ser 1.93 (*)    Calcium 8.5 (*)    Total Protein 5.6 (*)    Albumin 3.0 (*)    AST 13 (*)    GFR calc non Af Amer 35 (*)    GFR calc Af Amer 40 (*)    All other components within normal limits  BRAIN NATRIURETIC PEPTIDE - Abnormal; Notable for the following components:   B Natriuretic Peptide 334.1 (*)    All other components within normal limits  URINALYSIS, ROUTINE W REFLEX MICROSCOPIC - Abnormal; Notable for the following components:   APPearance HAZY (*)    Hgb urine dipstick SMALL (*)    Protein, ur 100 (*)    Leukocytes, UA LARGE (*)    WBC, UA >50 (*)    Bacteria, UA FEW (*)    All other components within normal limits  I-STAT ARTERIAL BLOOD GAS, ED - Abnormal; Notable for the following components:   pO2, Arterial 39.0 (*)    All other components within normal limits  CULTURE, BLOOD (  ROUTINE X 2)  CULTURE, BLOOD (ROUTINE X 2)  BLOOD GAS, ARTERIAL  I-STAT TROPONIN, ED    EKG EKG Interpretation  Date/Time:  Thursday August 13 2017 10:28:01 EDT Ventricular Rate:  96 PR Interval:    QRS Duration: 95 QT Interval:  353 QTC Calculation: 447 R Axis:   66 Text Interpretation:  Sinus rhythm Nonspecific repol abnormality, diffuse leads Baseline wander in lead(s) V4 Confirmed by Dene Gentry [phone]) on 08/13/2017 10:34:25 AM Also confirmed by Dene Gentry [phone]), editor Hattie Perch [phone])  on 08/13/2017 11:39:03 AM   Radiology Dg Chest 2  View  Result Date: 08/13/2017 CLINICAL DATA:  Short of breath EXAM: CHEST - 2 VIEW COMPARISON:  08/04/2017 FINDINGS: Diffuse bilateral airspace disease with mild improvement. Improvement in bibasilar atelectasis and bilateral effusions right greater than left. Prior CABG with cardiac enlargement. IMPRESSION: Congestive heart failure with edema with mild interval improvement. Electronically Signed   By: Franchot Gallo M.D.   On: 08/13/2017 11:07    Procedures Procedures (including critical care time)  Medications Ordered in ED Medications  albuterol (PROVENTIL) (2.5 MG/3ML) 0.083% nebulizer solution 5 mg (5 mg Nebulization Given 08/13/17 1105)  furosemide (LASIX) injection 40 mg (40 mg Intravenous Given 08/13/17 1150)  nitroGLYCERIN (NITROGLYN) 2 % ointment 0.5 inch (0.5 inches Topical Given 08/13/17 1300)     Initial Impression / Assessment and Plan / ED Course  I have reviewed the triage vital signs and the nursing notes.  Pertinent labs & imaging results that were available during my care of the patient were reviewed by me and considered in my medical decision making (see chart for details).     MDM  Screen complete  Patient is presenting for shortness of breath.  Symptoms seem to be improved upon arrival to the ED.  I suspect that this may be a combination of COPD and CHF.  Patient is currently on antibiotics.  Patient will be given a dose of Lasix for diuresis.  Patient requires admission for further work-up and observation.  Family medicine is contacted and made aware of the case they will evaluate for admission.   Final Clinical Impressions(s) / ED Diagnoses   Final diagnoses:  SOB (shortness of breath)    ED Discharge Orders    None       Valarie Merino, MD 08/13/17 1424

## 2017-08-13 NOTE — ED Notes (Signed)
Condom cath applied

## 2017-08-13 NOTE — H&P (Addendum)
[facility] Admission History and Physical Service Pager: [phone]  [name] Medical record number: [id number] Date of birth: October 15, 1950 Age: 67 y.o. Gender: male  Primary Care Provider: Kathrene Alu, MD Consultants: Kathaleen Bury Code Status: Mechele Dawley  Chief Complaint: hypoxia  Assessment and Plan: RAMESES OU is a 67 y.o. male presenting with hypoxia from the nursing home with desats to low 80s. Facility PMH is significant for COPD, CKD stage 4, CAD s/p CABG, T2DM, HTN, Hypothyroidism, and HLD.  Hypoxia secondary to HCAP vs CHF exacerbation: patient reported to be satting around 83% on 2L [place] at rest in the facility with a recent diagnosis of pneumonia via CXR by facility ([facility]) physician, for which the patient was started on Levaquin and Clindamycin (either 7/10 or 7/11).  Patient was given duo nebs and Solu-Medrol on the way to the hospital, and oxygen requirement was raised to 5%. On presentation in the ED patient was resting comfortably, satting 99% on 2.5 L nasal cannula, down from 4 L nasal cannula. Patient was afebrile and without cough, however it is believed he has been taking Tylenol. Chest x-ray showed marked improvement from 7/2 at last admission. No wheezing on auscultation, but mild crackles were appreciated throughout lower lobes bilaterally. DDx initially included PE, ACS, and COPD exacerbation; however patient remains without tachypnea, without cough producing phlegm, and trop neg with BNP near baseline. - Admit to med-surg, attending Dr. Erin Hearing - Levofloxacin will be continued for HCAP coverage. Clindamycin was initially prescribed by facility physician, but discontinued due to risk of C. diff. If patient's condition worsens consider adding Flagyl for additional coverage, as well as c.diff coverage. - Albuterol prn, scheduled DuoNebs to improve oxygenation  - SoluMedrol (7/11- ) -home  Carvedilol, and Imdur - Tylenol  655m for fevers, headaches - CPAP at night - Baseline oxygen 2L [place], but was between 4-5L on admit. Wean O2 as tolerated.  -cont pulse ox  Hypertension, chronic : BP 176/70 and 163/67 on admission - continue home Torsemide, Carvedilol, and Imdur - daily Vitals  CHF - HFpEF: last recorded EF 65-70% (07/06/2017). Appears mildly hypervolemic with 1+ pitting edema in leg and minor crackles ot lower lungs fields bilaterally, no need to aggressively diurese.  - Continue Amlodipine, Carvedilol, and Torsemide  Chronic Kidney Disease, Stage 4: stable at 1.93; baseline appears 1.6-1.8 - avoid nephrotoxic medications - home Torsemide - Track BMP  T2DM: blood sugar on admit 243. - sSSI - CBG checks qACHS  History of DVT, PAD: was taken off Eliquis at last admission, discharged on aspirin 81 only  History of Left Leg AKA: wound appropriately healed, no intervention needed   Hypothyroidism: stable; most recent TSH 3.38 (07/10/2017) - continue Levothyroxine 1220m  Hyperlipidemia: Lipitor 4049mGout: no acute flare - Allopurinol  BPH:  - Doxazosin  FEN/GI: Modified cardiac diet, Pantoprazole, Miralax Prophylaxis: lovenox  Disposition: Discharged to SNF pending clinical improvement  History of Present Illness:  Leonard [name] a 67 52o. male presenting with an episode of hypoxia with O2 sats dropping down to 84% on 2 L nasal cannula. The patient is somnolent in the ED, so the majority of the history was acquired from his sister. His sister reports that he was recently diagnosed with a "double pneumonia" and was started on two antibiotics, but cannot recall which ones.  The patient's SNF was contacted and they report he was spearing seeing the O2 sats between 85 and 88%, was lethargic, and  had a fever of 101.5 F.  The doctor covering care for the SNF ordered a chest x-ray, which revealed a pneumonia.  The physician started the patient on Levaquin and clindamycin on August 11, 2017. The patient  continued to be weak, lethargic, and hypoxic. He continued to have a dry cough, but no fever.  Patient then desatted down to 83% on August 13, 2017.  The facility gave him a DuoNeb to which he was minimally responsive, so they made the decision to have him sent to the hospital.  He was also complaining last night of "chest hurting" last nigth per his sister.   She said he would cough if he tried to drink something.  Patient was sleepy on ED admission exam and too drowsy to be reliably conversational.  He would seem to wake up, grumble a few syllables and drift back to sleep.  Rehab contact: Jerold Coombe, [phone] [facility] [phone]  Review Of Systems:   Review of Systems  Constitutional: Negative for chills, diaphoresis and fever.       Increased somnolence  HENT: Negative.   Eyes: Negative.   Respiratory: Positive for shortness of breath. Negative for cough, hemoptysis, sputum production and wheezing.   Cardiovascular: Negative for chest pain, palpitations, claudication and PND.  Gastrointestinal: Positive for abdominal pain. Negative for blood in stool, constipation and diarrhea.  Genitourinary: Negative.   Musculoskeletal: Negative.   Skin: Negative.     Patient Active Problem List   Diagnosis Date Noted  . Respiratory failure ([place]) 08/13/2017  . Pneumonitis 07/26/2017  . Pulmonary artery hypertension ([place]) 07/25/2017  . HCAP (healthcare-associated pneumonia)   . Pressure injury of skin 07/20/2017  . Hypernatremia   . Acute exacerbation of CHF (congestive heart failure) ([place]) 07/05/2017  . Atherosclerosis of aorta with gangrene ([place]) 03/12/2017  . Atherosclerotic peripheral vascular disease with gangrene ([place]) 02/14/2017  . Encounter for vitamin deficiency screening 02/11/2017  . Collapse of left talus 01/14/2017  . Diabetic polyneuropathy associated with type 2 diabetes mellitus ([place]) 01/14/2017  . Type 2 diabetes mellitus with pressure callus ([place]) 12/29/2016   . Idiopathic chronic gout of left foot without tophus   . Left foot pain 10/29/2016  . Long term (current) use of anticoagulants [Z79.01] 09/09/2016  . Chronic deep vein thrombosis (DVT) of femoral vein of left lower extremity ([place]) 09/03/2016  . Pleural effusion   . Morbid obesity due to excess calories ([place]) 03/17/2016  . Chronic anemia 02/28/2016  . Fatigue associated with anemia 01/21/2016  . Diabetic retinopathy ([place]) 11/13/2015  . CHF (congestive heart failure) ([place]) 08/27/2015  . Iron deficiency anemia 08/03/2015  . Claudication ([place])   . Diabetes ([place])   . Hematuria 06/27/2015  . CKD (chronic kidney disease) stage 3, GFR 30-59 ml/min (HCC)   . Skin lesion of face 02/22/2015  . 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 Deficiency anemia 01/15/2015  . CKD (chronic kidney disease) stage 4, GFR 15-29 ml/min (HCC) 01/15/2015  . Hypothyroidism 11/22/2014  . Acute on chronic diastolic congestive heart failure ([place]) 07/04/2014  . COPD GOLD III  02/16/2014  . Acute respiratory failure with hypoxia ([place])   . ARDS (adult respiratory distress syndrome) ([place]) 02/10/2014  . History of tobacco use 08/23/2013  . 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 CAD (coronary artery disease) 04/06/2013  . PVD - hx of Rt SFA PTA and s/p Rt 4-5th toe amp Dec 2014 04/05/2013  . Essential hypertension 11/25/2012  . Mixed hyperlipidemia 07/17/2008  Past Medical History: Past Medical History:  Diagnosis Date  . Cancer ([place])    skin cancer-  . CHF (congestive heart failure) ([place])   . Chronic kidney disease    not on dialysis  . Coronary artery disease   . Gangrene of toe ([place])    right 5th/notes 01/04/2013   . High cholesterol   . Hypertension   . Iron deficiency anemia 08/03/2015  . 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 PAD (peripheral artery disease) ([place])   . Shortness of breath dyspnea    with exertion  . Type II diabetes mellitus ([place])    Type  II    Past Surgical History: Past Surgical History:  Procedure Laterality Date  . ABDOMINAL AORTOGRAM W/LOWER EXTREMITY N/A 03/12/2017   Procedure: ABDOMINAL AORTOGRAM W/LOWER EXTREMITY;  Surgeon: Conrad Eagle, MD;  Location: [facility];  Service: Cardiovascular;  Laterality: N/A;  . AMPUTATION Right 01/28/2013   Procedure: RAY AMPUTATION RIGHT  4th & 5th TOE;  Surgeon: Rosetta Posner, MD;  Location: [place];  Service: Vascular;  Laterality: Right;  . AMPUTATION Left 03/15/2017   Procedure: AMPUTATION ABOVE KNEE;  Surgeon: Waynetta Sandy, MD;  Location: [facility];  Service: Vascular;  Laterality: Left;  . ANGIOPLASTY / STENTING FEMORAL Right 01/13/2013   superficial femoral artery x 2 (3 mm x 60 mm, 4 mm x 60 mm)  . AV FISTULA PLACEMENT Left 02/11/2016   Procedure: LEFT UPPER ARM BRACHIAL CEPHALIC ARTERIOVENOUS (AV) FISTULA CREATION;  Surgeon: Conrad Uniondale, MD;  Location: [facility];  Service: Vascular;  Laterality: Left;  . COLONOSCOPY W/ POLYPECTOMY    . CORONARY ARTERY BYPASS GRAFT N/A 04/07/2013   Procedure: CORONARY ARTERY BYPASS GRAFTING (CABG);  Surgeon: Ivin Poot, MD;  Location: [facility];  Service: Open Heart Surgery;  Laterality: N/A;  . INTRAOPERATIVE TRANSESOPHAGEAL ECHOCARDIOGRAM N/A 04/07/2013   Procedure: INTRAOPERATIVE TRANSESOPHAGEAL ECHOCARDIOGRAM;  Surgeon: Ivin Poot, MD;  Location: [place];  Service: Open Heart Surgery;  Laterality: N/A;  . LEFT HEART CATHETERIZATION WITH CORONARY ANGIOGRAM N/A 04/05/2013   Procedure: LEFT HEART CATHETERIZATION WITH CORONARY ANGIOGRAM;  Surgeon: Peter M Martinique, MD;  Location[facility];  Service: Cardiovascular;  Laterality: N/A;  . LOWER EXTREMITY ANGIOGRAM Bilateral 01/06/2013   Procedure: LOWER EXTREMITY ANGIOGRAM;  Surgeon: Conrad Sisters, MD;  Location: [facility] CATH LAB;  Service: Cardiovascular;  Laterality: Bilateral;  . LOWER EXTREMITY ANGIOGRAM Right 01/13/2013   Procedure: LOWER EXTREMITY ANGIOGRAM;  Surgeon: Conrad Crossnore, MD;   Location: [facility] CATH LAB;  Service: Cardiovascular;  Laterality: Right;  . SKIN CANCER EXCISION  2017  . THROAT SURGERY  1983   polyps  . TONSILLECTOMY AND ADENOIDECTOMY  ~ 1965    Social History: Social History   Tobacco Use  . Smoking status: Former Smoker    Packs/day: 1.00    Years: 46.00    Pack years: 46.00    Types: Cigarettes    Start date: 02/04/1967    Last attempt to quit: 02/03/2013    Years since quitting: 4.5  . Smokeless tobacco: Never Used  . Tobacco comment: Doing well with quitting.  Substance Use Topics  . Alcohol use: No    Alcohol/week: 0.0 oz  . Drug use: No   Additional social history:   Please also refer to relevant sections of EMR.  Family History: Family History  Problem Relation Age of Onset  . Cancer Mother  lung cancer  . Hypertension Mother   . Cancer Sister        patient thinks it was uterine cancer  . Hypertension Sister   . Heart attack Sister   . Stroke Neg Hx    (If not completed, MUST add something in)  Allergies and Medications: Allergies  Allergen Reactions  . Penicillins    Current Facility-Administered Medications on File Prior to Encounter  Medication Dose Route Frequency Provider Last Rate Last Dose  . sodium chloride flush (NS) 0.9 % injection 10 mL  10 mL Intracatheter PRN Heath Lark, MD       Current Outpatient Medications on File Prior to Encounter  Medication Sig Dispense Refill  . allopurinol (ZYLOPRIM) 100 MG tablet Take 100 mg by mouth daily.    Marland Kitchen amLODipine (NORVASC) 10 MG tablet Take 1 tablet (10 mg total) by mouth daily. Needs office visit for further refills 30 tablet 0  . aspirin EC 81 MG tablet Take 1 tablet (81 mg total) by mouth daily. 150 tablet 0  . atorvastatin (LIPITOR) 40 MG tablet TAKE ONE (1) TABLET BY MOUTH EVERY DAY AT 6:00PM (Patient taking differently: Take 40 mg by mouth once a day at 6 PM) 90 tablet 3  . Blood Glucose Monitoring Suppl (ONETOUCH VERIO FLEX SYSTEM) w/Device KIT 1 kit by  Does not apply route daily. 1 kit 3  . carvedilol (COREG) 25 MG tablet TAKE TWO (2) TABLETS BY MOUTH 2 TIMES DAILY WITH A MEAL (Patient taking differently: TAKE TWO TABLETS (69m) BY MOUTH 2 TIMES DAILY WITH A MEAL) 120 tablet 3  . cloNIDine (CATAPRES - DOSED IN MG/24 HR) 0.2 mg/24hr patch Place 1 patch (0.2 mg total) onto the skin once a week. 4 patch 0  . doxazosin (CARDURA) 8 MG tablet Take 1 tablet (8 mg total) by mouth every evening. (Patient taking differently: Take 8 mg by mouth at bedtime. )    . feeding supplement, ENSURE ENLIVE, (ENSURE ENLIVE) LIQD Take 237 mLs by mouth 3 (three) times daily between meals. 237 mL 12  . FEROSUL 325 (65 Fe) MG tablet TAKE ONE (1) TABLET BY MOUTH TWO (2) TIMES DAILY WITH A MEAL (Patient taking differently: TAKE 325 MG BY MOUTH TWO (2) TIMES DAILY WITH A MEAL) 60 tablet 3  . glucose blood (ONETOUCH VERIO) test strip Use as instructed to test three times daily. ICD-10 code: E11.22 100 each 12  . hydrALAZINE (APRESOLINE) 100 MG tablet TAKE ONE TABLET BY MOUTH EVERY EIGHT HOURS (Patient taking differently: No sig reported) 90 tablet 1  . insulin aspart (NOVOLOG FLEXPEN) 100 UNIT/ML FlexPen Inject 15 Units 2 (two) times daily before lunch and supper into the skin. 15 mL 5  . insulin glargine (LANTUS) 100 UNIT/ML injection Inject 0.24 mLs (24 Units total) into the skin daily. 10 mL 0  . ipratropium-albuterol (DUONEB) 0.5-2.5 (3) MG/3ML SOLN Take 3 mLs by nebulization 4 (four) times daily.    . isosorbide mononitrate (IMDUR) 60 MG 24 hr tablet TAKE ONE (1) TABLET BY MOUTH EVERY DAY (Patient taking differently: TAKE ONE TABLET (639m BY MOUTH EVERY DAY) 30 tablet 3  . levothyroxine (SYNTHROID, LEVOTHROID) 100 MCG tablet Take 100 mcg by mouth daily before breakfast.    . minoxidil (LONITEN) 10 MG tablet Take 0.5 tablets (5 mg total) by mouth daily. 15 tablet 0  . nitroGLYCERIN (NITROSTAT) 0.4 MG SL tablet Place 1 tablet (0.4 mg total) under the tongue every 5 (five)  minutes as needed for chest  pain. 30 tablet 12  . ONETOUCH DELICA LANCETS 17O MISC 1 each by Does not apply route 3 (three) times daily. ICD-10 code: E11.22 100 each 12  . pantoprazole (PROTONIX) 40 MG tablet TAKE ONE (1) TABLET BY MOUTH EVERY DAY (Patient taking differently: TAKE ONE TABLET (34m) BY MOUTH EVERY DAY) 90 tablet 0  . polyethylene glycol (MIRALAX / GLYCOLAX) packet Take 17 g by mouth daily as needed for mild constipation or moderate constipation. 14 each 0  . torsemide (DEMADEX) 20 MG tablet Take 1 tablet (20 mg total) by mouth daily. 30 tablet 0    Objective: BP (!) 174/71   Pulse 84   Temp 97.8 F (36.6 C) (Rectal)   Resp (!) 23   Ht '5\' 8"'  (1.727 m)   Wt 175 lb (79.4 kg)   SpO2 98%   BMI 26.61 kg/m  Physical  General: somnolent, frail Eyes: too somnolent for reliable eye exam ENTM: Moist mucous membranes Cardiovascular: RRR, 2/6 murmur, 1+ pitting edema Respiratory: decreased lung sounds and crackles bilaterally in lower lung fields, satting 99 on 4L Gastrointestinal: soft, nontender, nondistended MSK: left leg BKA Derm: no rashes appreciated Neuro: somnolent on exam Psych: somnolent on exam  Labs and Imaging: CBC BMET  Recent Labs  Lab 08/13/17 1050  WBC 5.1  HGB 8.3*  HCT 27.2*  PLT 113*   Recent Labs  Lab 08/13/17 1050  NA 142  K 4.3  CL 110  CO2 22  BUN 23  CREATININE 1.93*  GLUCOSE 216*  CALCIUM 8.5*     Dg Chest 2 View  Result Date: 08/13/2017 CLINICAL DATA:  Short of breath EXAM: CHEST - 2 VIEW COMPARISON:  08/04/2017 FINDINGS: Diffuse bilateral airspace disease with mild improvement. Improvement in bibasilar atelectasis and bilateral effusions right greater than left. Prior CABG with cardiac enlargement. IMPRESSION: Congestive heart failure with edema with mild interval improvement. Electronically Signed   By: CFranchot GalloM.D.   On: 08/13/2017 11:07    HSkipper Cliche7/12/2017, 2:00 PM PGY-1, [place] Intern pager: [phone] text pages welcome  FPTS Upper-Level Resident Addendum   I have independently interviewed and examined the patient. I have discussed the above with the original author and agree with their documentation. My edits for correction/addition/clarification are in blue. Please see also any attending notes.    SSherene Sires DO  PGY-2, CArgentineFamily Medicine 08/13/2017 6:27 PM  FPTS Service pager: [phone](text pages welcome through [facility]

## 2017-08-14 LAB — CBC
HEMATOCRIT: 25 % — AB (ref 39.0–52.0)
Hemoglobin: 7.5 g/dL — ABNORMAL LOW (ref 13.0–17.0)
MCH: 27.2 pg (ref 26.0–34.0)
MCHC: 30 g/dL (ref 30.0–36.0)
MCV: 90.6 fL (ref 78.0–100.0)
Platelets: 131 10*3/uL — ABNORMAL LOW (ref 150–400)
RBC: 2.76 MIL/uL — AB (ref 4.22–5.81)
RDW: 17.4 % — ABNORMAL HIGH (ref 11.5–15.5)
WBC: 3.3 10*3/uL — ABNORMAL LOW (ref 4.0–10.5)

## 2017-08-14 LAB — GLUCOSE, CAPILLARY
GLUCOSE-CAPILLARY: 210 mg/dL — AB (ref 70–99)
Glucose-Capillary: 250 mg/dL — ABNORMAL HIGH (ref 70–99)
Glucose-Capillary: 221 mg/dL — ABNORMAL HIGH (ref 70–99)

## 2017-08-14 LAB — BASIC METABOLIC PANEL
Anion gap: 8 (ref 5–15)
BUN: 28 mg/dL — ABNORMAL HIGH (ref 8–23)
CALCIUM: 8.4 mg/dL — AB (ref 8.9–10.3)
CHLORIDE: 109 mmol/L (ref 98–111)
CO2: 21 mmol/L — AB (ref 22–32)
CREATININE: 1.93 mg/dL — AB (ref 0.61–1.24)
GFR calc Af Amer: 40 mL/min — ABNORMAL LOW (ref >60)
GFR calc non Af Amer: 35 mL/min — ABNORMAL LOW (ref >60)
GLUCOSE: 243 mg/dL — AB (ref 70–99)
Potassium: 4.4 mmol/L (ref 3.5–5.1)
Sodium: 138 mmol/L (ref 135–145)

## 2017-08-14 MED ORDER — LEVOFLOXACIN IN D5W 500 MG/100ML IV SOLN
500.0000 mg | INTRAVENOUS | Status: DC
  Filled 2017-08-14: qty 100

## 2017-08-14 MED ORDER — LEVOFLOXACIN 750 MG PO TABS: 750.0000 mg | ORAL_TABLET | ORAL | 0 refills | Status: AC

## 2017-08-14 MED ORDER — LEVOFLOXACIN 500 MG PO TABS
500.0000 mg | ORAL_TABLET | Freq: Every day | ORAL | Status: DC
  Administered 2017-08-14: 500 mg via ORAL
  Filled 2017-08-14: qty 1

## 2017-08-14 MED ORDER — GLUCERNA SHAKE PO LIQD: 237.0000 mL | Freq: Three times a day (TID) | ORAL | Status: DC

## 2017-08-14 MED ORDER — ORAL CARE MOUTH RINSE: 15.0000 mL | Freq: Two times a day (BID) | OROMUCOSAL | Status: DC

## 2017-08-14 NOTE — Progress Notes (Signed)
Initial Nutrition Assessment  DOCUMENTATION CODES:   Not applicable  INTERVENTION:    Glucerna Shake po TID, each supplement provides 220 kcal and 10 grams of protein  NUTRITION DIAGNOSIS:   Increased nutrient needs related to chronic illness as evidenced by estimated needs  GOAL:   Patient will meet greater than or equal to 90% of their needs  MONITOR:   PO intake, Supplement acceptance, Labs, Skin, Weight trends, I & O's  REASON FOR ASSESSMENT:   Malnutrition Screening Tool  ASSESSMENT:   67 y.o. Male presented with hypoxia from the nursing home with desats to low 80s /w recent Dx of HCAP. Facility PMH is significant for COPD, CKD stage 4, CAD s/p CABG, T2DM, HTN, Hypothyroidism, and HLD.  RD spoke with patient at bedside. Reports his appetite is good but he doesn't like the food. He is on a Dysphagia 1 diet, however, denies swallowing difficulty.  PO intake poor at 20% per flowsheets given dislike of diet texture. Pt is asking RD if he can have a cheeseburger with fries for lunch. Labs and medications reviewed. CBG's [id number].  Verbal order with Read Back received per Dr. Ouida Sills to change pt's diet to Regular.  NUTRITION - FOCUSED PHYSICAL EXAM:  Unable to complete at this time.  Diet Order:   Diet Order           Diet regular Room service appropriate? Yes; Fluid consistency: Thin  Diet effective now         EDUCATION NEEDS:   No education needs have been identified at this time  Skin:  Skin Assessment: Skin Integrity Issues: Skin Integrity Issues:: Stage II, Other (Comment) Stage II: coccyx Other: R foot wound  Last BM:  7/11  Height:   Ht Readings from Last 1 Encounters:  08/13/17 5\' 8"  (1.727 m)   Weight:   Wt Readings from Last 1 Encounters:  08/13/17 175 lb (79.4 kg)   Ideal Body Weight:  70 kg  BMI:  Estimated Nutritional Needs:   Kcal:  2100-2300  Protein:  100-115 gm  Fluid:  2.1 L  Arthur Holms, RD, LDN Pager #:  [phone] After-Hours Pager #: [phone]

## 2017-08-14 NOTE — Progress Notes (Addendum)
Family Medicine Teaching Service Daily Progress Note Intern Pager: [phone]  [name] Medical record number: [id number] Date of birth: 1950/07/15 Age: 67 y.o. Gender: male  Primary Care Provider: Lind Covert, MD Consultants: Kathaleen Bury  Code Status: Full  Pt Overview and Major Events to Date:  7/11 - admitted  Assessment and Plan: Leonard [name] is a 67 y.o. male presenting with hypoxia from the nursing home with desats to low 80s /w recent Dx of HCAP. Facility PMH is significant for COPD, CKD stage 4, CAD s/p CABG, T2DM, HTN, Hypothyroidism, and HLD.  Hypoxia secondary to HCAP vs CHF exacerbation:  - Admit totele, attending Dr. Erin Hearing - Levofloxacin for HCAP coverage. D/c'ed Clindamycin  - Albuterol prn, scheduled DuoNebs - SoluMedrol (7/11- ) - home Carvedilol, and Imdur - Tylenol 650mg  for fevers, headaches - CPAP at night - Baseline oxygen 2L [place]; wean as tolerated - Aim for 88-92% O2. - continue pulse ox  Hypertension, chronic : BP 141/51 (7/12) - continue home Torsemide, Carvedilol, and Imdur - daily Vitals  CHF - HFpEF: last recorded EF 65-70% (07/06/2017). Appears mildly hypervolemic with 1+ edema in leg, minor crackles to lower lungs fields bilaterally, no need to aggressively diurese.  - Continue Amlodipine, Carvedilol, and Torsemide  Chronic Kidney Disease, Stage 4: Cr 1.93 (7/12); baseline appears 1.6-1.8 - avoid nephrotoxic medications - home Torsemide - Track BMP  T2DM: blood sugar on admit 243. - sSSI - CBG checks qACHS - per DM Ed: Lantus 20 units daily and continuing Novolog SENSITIVE correction scale TID  & HS.   Hypothyroidism: stable; most recent TSH 3.38 (07/10/2017) - continue Levothyroxine 117mcg  History of DVT, PAD: was taken off Eliquis at last admission, discharged on aspirin 81 only  History of Left Leg AKA: wound appropriately healed, no intervention needed   Hyperlipidemia: Lipitor 40mg   Gout: no  acute flare - Allopurinol  BPH:  - Doxazosin  FEN/GI: Modified cardiac diet, Pantoprazole, Miralax Prophylaxis: lovenox  Disposition: Discharged to SNF pending clinical improvement   Subjective: Patient is resting comfortable in bed this morning alert and oriented. Denies chest pain, shortness of breath, nausea, vomiting, worsening fatigue and weakness. He reports feeling "alright".  Objective: Temp:  [97.8 F (36.6 C)-99.7 F (37.6 C)] 98.4 F (36.9 C) (07/12 0002) Pulse Rate:  [76-93] 76 (07/12 0002) Resp:  [11-27] 18 (07/11 2200) BP: (141-180)/(51-78) 141/51 (07/12 0002) SpO2:  [88 %-100 %] 92 % (07/12 0400) Weight:  [175 lb (79.4 kg)] 175 lb (79.4 kg) (07/11 1030) Physical Exam: General: awake and alert Eyes: EOMI ENTM: Moist mucous membranes Cardiovascular: RRR, 2/6 murmur, trace edema in right leg Respiratory: crackles bilaterally in lower lung fields, but improved from previous presentation, satting 92 on 3L Gastrointestinal: soft, nontender, nondistended, bowel sounds in 4 quadrants MSK: left leg AKA* Derm: no rashes appreciated Neuro:  Grossly intact Psych:  Normal behavior, minimally agitated  Laboratory: Recent Labs  Lab 08/13/17 1050 08/14/17 0254  WBC 5.1 3.3*  HGB 8.3* 7.5*  HCT 27.2* 25.0*  PLT 113* 131*   Recent Labs  Lab 08/13/17 1050 08/14/17 0254  NA 142 138  K 4.3 4.4  CL 110 109  CO2 22 21*  BUN 23 28*  CREATININE 1.93* 1.93*  CALCIUM 8.5* 8.4*  PROT 5.6*  --   BILITOT 0.5  --   ALKPHOS 79  --   ALT 11  --   AST 13*  --   GLUCOSE 216* 243*    Imaging/Diagnostic  Tests: Dg Chest 1 View Result Date: 07/29/2017 FINDINGS: Bibasilar airspace disease unchanged. Right pleural effusion unchanged. Postop CABG. Feeding tube enters the stomach with the tip not visualized. IMPRESSION: Bibasilar airspace disease and right pleural effusion unchanged.   Dg Chest 2 View Result Date: 08/13/2017 FINDINGS: Diffuse bilateral airspace  disease with mild improvement. Improvement in bibasilar atelectasis and bilateral effusions right greater than left. Prior CABG with cardiac enlargement.  IMPRESSION: Congestive heart failure with edema with mild interval improvement.   Daisy Floro, DO 08/14/2017, 6:51 AM PGY-1, [place] Intern pager: [phone], text pages welcome

## 2017-08-14 NOTE — Consult Note (Signed)
   [facility] CM Inpatient Consult   08/14/2017  Leonard [name] 1950/06/02 [id number]   Patient just recently outreach by our [facility] CSW while at [facility] in the Medicare [place].  Our community based plan of care has focused on potential transition to community for resource support.  Patient will receive a post discharge transition of care call and will be evaluated for monthly home visits for assessments and disease process education.  Made Inpatient Case Manager aware that [facility] Management following. Patient's current plan is to return to skilled nursing facility.  Of note, [facility] Care Management services does not replace or interfere with any services that are needed or arranged by inpatient case management or social work.  For additional questions or referrals please contact:  Natividad Brood, RN BSN [facility] Liaison  [phone] business mobile phone Toll free office [phone]

## 2017-08-14 NOTE — Social Work (Signed)
Clinical Social Worker facilitated patient discharge including contacting patient family and facility to confirm patient discharge plans.  Clinical information faxed to facility and family agreeable with plan.    CSW arranged ambulance transport via [facility] to [place].    RN to call [phone] to give report prior to discharge.  Clinical Social Worker will sign off for now as social work intervention is no longer needed. Please consult [place] again if new need arises.  Elissa Hefty, LCSW Clinical Social Worker [phone]

## 2017-08-14 NOTE — Care Management Note (Signed)
Case Management Note  Patient Details  Name: Leonard [name] MRN: [id number] Date of Birth: 1950-08-11  Subjective/Objective:   From [facility], presents with resp failure secondary to HCAP vs CHF ex, has hx of L AKA.  conts on iv solumedrol and iv abx.                  Action/Plan: DC back to SNF when medically ready.  Expected Discharge Date:                  Expected Discharge Plan:  Skilled Nursing Facility  In-House Referral:  Clinical Social Work  Discharge planning Services  CM Consult  Post Acute Care Choice:    Choice offered to:     DME Arranged:    DME Agency:     HH Arranged:    [place] Agency:     Status of Service:  In process, will continue to follow  If discussed at Long Length of Stay Meetings, dates discussed:    Additional Comments:  Zenon Mayo, RN 08/14/2017, 8:40 AM

## 2017-08-14 NOTE — Discharge Summary (Addendum)
[facility] Discharge Summary  [name] Medical record number: [id number] Date of birth: Dec 10, 1950 Age: 67 y.o. Gender: male Date of Admission: 08/13/2017  Date of Discharge: 08/14/2017 Admitting Physician: Lind Covert, MD  Primary Care Provider: Lind Covert, MD Consultants: none  Indication for Hospitalization: Hypoxia  Discharge Diagnoses/Problem List:  Respiratory depression secondary to excessive oxygen and use COPD Hypertension CHF - HFpEF CKD Stage 4 T2DM Hypothyroidism Left leg above-knee-amputation DVT PAD Hyperlipidemia Gout Benign Prostatic Hyperplasia  Disposition: Discharged to SNF ([facility])   Discharge Condition: Stable  Discharge Exam:  General:alert and oriented, patient resting comfortably, not in any apparent distress Head: Normocephalic, atraumatic Eyes:EOMI  ENTM: Patent nares, moist mucous membranes, cervical range of motion normal Cardiovascular: RRR,2/6 murmur,trace edema in right leg improving Respiratory:Minimal crackles in lobe, otherwise clear to auscultation bilaterally, not in respiratory distress, satting 97% on 2 L Gastrointestinal: soft, nontender, nondistended, bowel sounds in 4 quadrants YIA:XKPV leg AKA, right leg normal and without deformity Derm: no rashes appreciated Neuro: Grossly intact Psych: Normal behavior, minimally agitated  Brief Hospital Course:  Leonard [name] is a 67 y.o. male presenting with an episode of hypoxia with O2 sats dropping down to 84% on 2 L nasal cannula, increased lethargy, and a recent diagnosis of HCAP.   In route to the hospital the patient was given solumedrol and duonebs. Blood Oxygen was raised to between 4 and 5 L nasal cannula, and the patient began having blood oxygen saturations up to 99%.  The patient's lungs presented with decreased breath sounds and crackles throughout the bilateral lower lobes.  Patient was  administered to the telemetry.  Levaquin was continued and clindamycin was discontinued for treatment of hospital acquired pneumonia, as well as potential COPD exacerbation.  His presentation continued to improve and his lungs became clear.  He was weaned to his normal saturation on 2 L nasal cannula, remains without fevers, and is stable to go home.  Issues for Follow Up:  1. Maintain blood oxygen saturation between 88-92% O2. 2. Continue Levaquin 725m q48 hours for COPD and HCAP. 3. Continue working with PT/OT for overall health improvement.  Significant Procedures: None  Significant Labs and Imaging:  Recent Labs  Lab 08/13/17 1050 08/14/17 0254  WBC 5.1 3.3*  HGB 8.3* 7.5*  HCT 27.2* 25.0*  PLT 113* 131*   Recent Labs  Lab 08/13/17 1050 08/14/17 0254  NA 142 138  K 4.3 4.4  CL 110 109  CO2 22 21*  GLUCOSE 216* 243*  BUN 23 28*  CREATININE 1.93* 1.93*  CALCIUM 8.5* 8.4*  ALKPHOS 79  --   AST 13*  --   ALT 11  --   ALBUMIN 3.0*  --     Dg Chest 2 View  Result Date: 08/13/2017 CLINICAL DATA:  Short of breath EXAM: CHEST - 2 VIEW COMPARISON:  08/04/2017 FINDINGS: Diffuse bilateral airspace disease with mild improvement. Improvement in bibasilar atelectasis and bilateral effusions right greater than left. Prior CABG with cardiac enlargement. IMPRESSION: Congestive heart failure with edema with mild interval improvement. Electronically Signed   By: CFranchot GalloM.D.   On: 08/13/2017 11:07     Results/Tests Pending at Time of Discharge: none Discharge Medications:  Allergies as of 08/14/2017      Reactions   Penicillins Other (See Comments)   Listed on MAR      Medication List    STOP taking these medications   clindamycin 300 MG  capsule Commonly known as:  CLEOCIN   insulin glargine 100 UNIT/ML injection Commonly known as:  LANTUS     TAKE these medications   acetaminophen 325 MG tablet Commonly known as:  TYLENOL Take 650 mg by mouth every 6 (six) hours  as needed for mild pain or fever.   allopurinol 100 MG tablet Commonly known as:  ZYLOPRIM Take 100 mg by mouth daily.   amLODipine 10 MG tablet Commonly known as:  NORVASC Take 1 tablet (10 mg total) by mouth daily. Needs office visit for further refills   aspirin EC 81 MG tablet Take 1 tablet (81 mg total) by mouth daily.   atorvastatin 40 MG tablet Commonly known as:  LIPITOR TAKE ONE (1) TABLET BY MOUTH EVERY DAY AT 6:00PM What changed:  See the new instructions.   carvedilol 25 MG tablet Commonly known as:  COREG TAKE TWO (2) TABLETS BY MOUTH 2 TIMES DAILY WITH A MEAL What changed:  See the new instructions.   cloNIDine 0.2 mg/24hr patch Commonly known as:  CATAPRES - Dosed in mg/24 hr Place 1 patch (0.2 mg total) onto the skin once a week.   doxazosin 8 MG tablet Commonly known as:  CARDURA Take 1 tablet (8 mg total) by mouth every evening.   ENSURE Take 237 mLs by mouth 3 (three) times daily between meals.   feeding supplement (ENSURE ENLIVE) Liqd Take 237 mLs by mouth 3 (three) times daily between meals.   FEROSUL 325 (65 FE) MG tablet Generic drug:  ferrous sulfate TAKE ONE (1) TABLET BY MOUTH TWO (2) TIMES DAILY WITH A MEAL What changed:  See the new instructions.   glucose blood test strip Commonly known as:  ONETOUCH VERIO Use as instructed to test three times daily. ICD-10 code: E11.22   hydrALAZINE 100 MG tablet Commonly known as:  APRESOLINE TAKE ONE TABLET BY MOUTH EVERY EIGHT HOURS   insulin aspart 100 UNIT/ML FlexPen Commonly known as:  NOVOLOG FLEXPEN Inject 15 Units 2 (two) times daily before lunch and supper into the skin.   insulin detemir 100 UNIT/ML injection Commonly known as:  LEVEMIR Inject 50 Units into the skin at bedtime.   ipratropium-albuterol 0.5-2.5 (3) MG/3ML Soln Commonly known as:  DUONEB Take 3 mLs by nebulization 4 (four) times daily.   isosorbide mononitrate 60 MG 24 hr tablet Commonly known as:  IMDUR TAKE ONE  (1) TABLET BY MOUTH EVERY DAY What changed:  See the new instructions.   levofloxacin 750 MG tablet Commonly known as:  LEVAQUIN Take 1 tablet (750 mg total) by mouth every other day for 2 doses. Start taking on:  08/15/2017 What changed:  when to take this   levothyroxine 125 MCG tablet Commonly known as:  SYNTHROID, LEVOTHROID Take 125 mcg by mouth daily before breakfast.   minoxidil 10 MG tablet Commonly known as:  LONITEN Take 0.5 tablets (5 mg total) by mouth daily.   nitroGLYCERIN 0.4 MG SL tablet Commonly known as:  NITROSTAT Place 1 tablet (0.4 mg total) under the tongue every 5 (five) minutes as needed for chest pain.   ONETOUCH DELICA LANCETS 51O Misc 1 each by Does not apply route 3 (three) times daily. ICD-10 code: E11.22   [facility] VERIO FLEX SYSTEM w/Device Kit 1 kit by Does not apply route daily.   pantoprazole 40 MG tablet Commonly known as:  PROTONIX TAKE ONE (1) TABLET BY MOUTH EVERY DAY What changed:  See the new instructions.   polyethylene glycol packet Commonly  known as:  MIRALAX / GLYCOLAX Take 17 g by mouth daily as needed for mild constipation or moderate constipation.   torsemide 20 MG tablet Commonly known as:  DEMADEX Take 1 tablet (20 mg total) by mouth daily. What changed:  when to take this      Discharge Instructions: Please refer to Patient Instructions section of EMR for full details.  Patient was counseled important signs and symptoms that should prompt return to medical care, changes in medications, dietary instructions, activity restrictions, and follow up appointments.   Follow-Up Appointments: Contact information for after-discharge care    Destination    [facility] .   Service:  Skilled Nursing Contact information: [address] [place] [phone]              Daisy Floro, DO 08/14/2017, 3:49 PM PGY-1, [place]

## 2017-08-14 NOTE — NC FL2 (Signed)
MEDICAID FL2 LEVEL OF CARE SCREENING TOOL     IDENTIFICATION  [name] Birthdate: April 27, 1950 Sex: male Admission Date (Current Location): 08/13/2017  [facility] and [place] Number:  Herbalist and Address:  The . [name], [place] [address], [place], [place] [address]      Provider Number: [id number]  Attending Physician Name and Address:  Lind Covert, MD  Relative Name and Phone Number:  [name] sister, [phone]    Current Level of Care: Hospital Recommended Level of Care: [place] Prior Approval Number:    Date Approved/Denied:   PASRR Number: [id number]  Discharge Plan: SNF    Current Diagnoses: Patient Active Problem List   Diagnosis Date Noted  . Respiratory failure ([place]) 08/13/2017  . Pneumonitis 07/26/2017  . Pulmonary artery hypertension ([place]) 07/25/2017  . HCAP (healthcare-associated pneumonia)   . Pressure injury of skin 07/20/2017  . Hypernatremia   . Acute exacerbation of CHF (congestive heart failure) ([place]) 07/05/2017  . Atherosclerosis of aorta with gangrene ([place]) 03/12/2017  . Atherosclerotic peripheral vascular disease with gangrene ([place]) 02/14/2017  . Encounter for vitamin deficiency screening 02/11/2017  . Collapse of left talus 01/14/2017  . Diabetic polyneuropathy associated with type 2 diabetes mellitus ([place]) 01/14/2017  . Type 2 diabetes mellitus with pressure callus ([place]) 12/29/2016  . Idiopathic chronic gout of left foot without tophus   . Left foot pain 10/29/2016  . Long term (current) use of anticoagulants [Z79.01] 09/09/2016  . 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 Pleural effusion   . Morbid obesity due to excess calories ([place]) 03/17/2016  . Chronic anemia 02/28/2016  . Fatigue associated with anemia 01/21/2016  . Diabetic retinopathy ([place]) 11/13/2015  . CHF (congestive heart failure) ([place])  08/27/2015  . Iron deficiency anemia 08/03/2015  . Claudication ([place])   . Diabetes ([place])   . Hematuria 06/27/2015  . CKD (chronic kidney disease) stage 3, GFR 30-59 ml/min (HCC)   . Skin lesion of face 02/22/2015  . 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 Deficiency anemia 01/15/2015  . CKD (chronic kidney disease) stage 4, GFR 15-29 ml/min (HCC) 01/15/2015  . Hypothyroidism 11/22/2014  . Acute on chronic diastolic congestive heart failure ([place]) 07/04/2014  . COPD GOLD III  02/16/2014  . Acute respiratory failure with hypoxia ([place])   . SOB (shortness of breath)   . ARDS (adult respiratory distress syndrome) ([place]) 02/10/2014  . History of tobacco use 08/23/2013  . 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 CAD (coronary artery disease) 04/06/2013  . PVD - hx of Rt SFA PTA and s/p Rt 4-5th toe amp Dec 2014 04/05/2013  . Essential hypertension 11/25/2012  . Mixed hyperlipidemia 07/17/2008    Orientation RESPIRATION BLADDER Height & Weight     Self, Place, Time  O2(Nasal cannula 2L) Incontinent, External catheter Weight: 175 lb (79.4 kg) Height:  5\' 8"  (172.7 cm)  BEHAVIORAL SYMPTOMS/MOOD NEUROLOGICAL BOWEL NUTRITION STATUS      Incontinent Diet(Please see DC Summary)  AMBULATORY STATUS COMMUNICATION OF NEEDS Skin   Extensive Assist Verbally PU Stage and Appropriate Care(Stage II on coccyx; wound on foot)                       Personal Care Assistance Level of Assistance  Bathing, Feeding, Dressing Bathing Assistance: Maximum assistance Feeding assistance: Maximum assistance Dressing Assistance: Maximum assistance  Functional Limitations Info  Sight, Hearing, Speech Sight Info: Adequate Hearing Info: Adequate Speech Info: Adequate    SPECIAL CARE FACTORS FREQUENCY  OT (By licensed OT), PT (By licensed PT)                    Contractures Contractures Info: Not present    Additional Factors Info  Code Status, Allergies, Insulin Sliding Scale  Code Status Info: Full code Allergies Info: PENICILLINS  Psychotropic Info: n/a Insulin Sliding Scale Info: INsulin DAily       Current Medications (08/14/2017):  This is the current hospital active medication list Current Facility-Administered Medications  Medication Dose Route Frequency Provider Last Rate Last Dose  . acetaminophen (TYLENOL) tablet 650 mg  650 mg Oral Q6H PRN Matilde Haymaker, MD      . albuterol (PROVENTIL) (2.5 MG/3ML) 0.083% nebulizer solution 2.5 mg  2.5 mg Nebulization Q2H PRN Matilde Haymaker, MD      . allopurinol (ZYLOPRIM) tablet 100 mg  100 mg Oral Daily Matilde Haymaker, MD   100 mg at 08/14/17 1109  . amLODipine (NORVASC) tablet 10 mg  10 mg Oral Daily Matilde Haymaker, MD   10 mg at 08/14/17 1057  . aspirin EC tablet 81 mg  81 mg Oral Daily Matilde Haymaker, MD   81 mg at 08/14/17 1057  . atorvastatin (LIPITOR) tablet 40 mg  40 mg Oral q1800 Matilde Haymaker, MD   40 mg at 08/13/17 1712  . carvedilol (COREG) tablet 50 mg  50 mg Oral BID Matilde Haymaker, MD   50 mg at 08/14/17 0817  . doxazosin (CARDURA) tablet 8 mg  8 mg Oral QPM Matilde Haymaker, MD   8 mg at 08/13/17 1712  . enoxaparin (LOVENOX) injection 40 mg  40 mg Subcutaneous Q24H Matilde Haymaker, MD   40 mg at 08/13/17 1713  . feeding supplement (GLUCERNA SHAKE) (GLUCERNA SHAKE) liquid 237 mL  237 mL Oral TID BM Chambliss, Marshall L, MD      . insulin aspart (novoLOG) injection 0-5 Units  0-5 Units Subcutaneous QHS Matilde Haymaker, MD   3 Units at 08/13/17 2300  . insulin aspart (novoLOG) injection 0-9 Units  0-9 Units Subcutaneous TID WC Matilde Haymaker, MD   3 Units at 08/14/17 1257  . ipratropium-albuterol (DUONEB) 0.5-2.5 (3) MG/3ML nebulizer solution 3 mL  3 mL Nebulization TID Matilde Haymaker, MD   3 mL at 08/14/17 1448  . isosorbide mononitrate (IMDUR) 24 hr tablet 60 mg  60 mg Oral Daily Matilde Haymaker, MD   60 mg at 08/14/17 1057  . levofloxacin (LEVAQUIN) tablet 500 mg  500 mg Oral Daily Reginia Naas, RPH   500 mg at 08/14/17  1057  . levothyroxine (SYNTHROID, LEVOTHROID) tablet 125 mcg  125 mcg Oral QAC breakfast Matilde Haymaker, MD   125 mcg at 08/14/17 0544  . MEDLINE mouth rinse  15 mL Mouth Rinse BID Chambliss, Marshall L, MD      . methylPREDNISolone sodium succinate (SOLU-MEDROL) 125 mg/2 mL injection 125 mg  125 mg Intravenous Q12H Matilde Haymaker, MD   125 mg at 08/14/17 0400  . nitroGLYCERIN (NITROSTAT) SL tablet 0.4 mg  0.4 mg Sublingual Q5 min PRN Matilde Haymaker, MD      . pantoprazole (PROTONIX) EC tablet 40 mg  40 mg Oral Daily Matilde Haymaker, MD   40 mg at 08/14/17 1057  . polyethylene glycol (MIRALAX / GLYCOLAX) packet 17 g  17 g Oral Daily PRN Matilde Haymaker, MD      .  torsemide (DEMADEX) tablet 20 mg  20 mg Oral BID Matilde Haymaker, MD   20 mg at 08/14/17 2446   Facility-Administered Medications Ordered in Other Encounters  Medication Dose Route Frequency Provider Last Rate Last Dose  . sodium chloride flush (NS) 0.9 % injection 10 mL  10 mL Intracatheter PRN Heath Lark, MD         Discharge Medications: Please see discharge summary for a list of discharge medications.  Relevant Imaging Results:  Relevant Lab Results:   Additional Information SS#: [id number]  Normajean Baxter, LCSW

## 2017-08-14 NOTE — Clinical Social Work Placement (Signed)
   CLINICAL SOCIAL WORK PLACEMENT  NOTE  Date:  08/14/2017  Patient Details  Name: Leonard [name] MRN: [id number] Date of Birth: [birth year]  Clinical Social Work is seeking post-discharge placement for this patient at the [place] level of care (*CSW will initial, date and re-position this form in  chart as items are completed):  Yes   Patient/family provided with [place] Work Department's list of facilities offering this level of care within the geographic area requested by the patient (or if unable, by the patient's family).  Yes   Patient/family informed of their freedom to choose among providers that offer the needed level of care, that participate in Medicare, Medicaid or managed care program needed by the patient, have an available bed and are willing to accept the patient.  Yes   Patient/family informed of [facility]'s ownership interest in [facility] and [facility], as well as of the fact that they are under no obligation to receive care at these facilities.  PASRR submitted to EDS on       PASRR number received on       Existing PASRR number confirmed on 08/14/17     FL2 transmitted to all facilities in geographic area requested by pt/family on       FL2 transmitted to all facilities within larger geographic area on 08/14/17     Patient informed that his/her managed care company has contracts with or will negotiate with certain facilities, including the following:        Yes   Patient/family informed of bed offers received.  Patient [facility] bed at [facility]     Physician recommends and patient chooses bed at      Patient to be transferred to [facility] on 08/14/17.  Patient to be transferred to facility by PTAR     Patient family notified on 08/14/17 of transfer.  Name of family member notified:  sister joyce contacted     PHYSICIAN        Additional Comment:    _______________________________________________ Normajean Baxter, LCSW 08/14/2017, 4:04 PM

## 2017-08-14 NOTE — Clinical Social Work Note (Signed)
Clinical Social Work Assessment  Patient Details  Name: Leonard [name] MRN: [id number] Date of Birth: 18-Sep-1950  Date of referral:  08/14/17               Reason for consult:  Discharge Planning                Permission sought to share information with:  Facility Sport and exercise psychologist, Family Supports Permission granted to share information::  Yes, Verbal Permission Granted  Name::     Leonard [name]  Agency::  [facility]  Relationship::  Sister  Contact Information:  [phone]  Housing/Transportation Living arrangements for the past 2 months:  [place] of Information:  Patient Patient Interpreter Needed:  None Criminal Activity/Legal Involvement Pertinent to Current Situation/Hospitalization:  No - Comment as needed Significant Relationships:  Other Family Members Lives with:  Facility Resident Do you feel safe going back to the place where you live?  Yes Need for family participation in patient care:  Yes (Comment)  Care giving concerns: CSW received consult for discharge. CSW to continue to follow and assist with discharge planning needs.    Social Worker assessment / plan:  CSW spoke with patient's sister and niece concerning return to [place].  Employment status:  Retired Forensic scientist:  Medicare PT Recommendations:  Not assessed at this time Information / Referral to community resources:  [place]  Patient/Family's Response to care:  Patient's family report understanding of discharge plan and request PTAR.  Patient/Family's Understanding of and Emotional Response to Diagnosis, Current Treatment, and Prognosis:  Patient/family is realistic regarding therapy needs and expressed being hopeful for return to SNF placement. Patient expressed understanding of CSW role and discharge process as well as medical condition. No questions/concerns about plan or treatment.    Emotional Assessment Appearance:  Appears stated  age Attitude/Demeanor/Rapport:  Engaged Affect (typically observed):  Accepting, Appropriate Orientation:  Oriented to Self, Oriented to Place, Oriented to  Time, Oriented to Situation Alcohol / Substance use:  Not Applicable Psych involvement (Current and /or in the community):  No (Comment)  Discharge Needs  Concerns to be addressed:  Care Coordination Readmission within the last 30 days:  Yes Current discharge risk:  Dependent with Mobility Barriers to Discharge:  No Barriers Identified   Benard Halsted, [place] 08/14/2017, 3:59 PM

## 2017-08-14 NOTE — Progress Notes (Signed)
  Inpatient Diabetes Program Recommendations  AACE/ADA: New Consensus Statement on Inpatient Glycemic Control (2015)  Target Ranges:  Prepandial:   less than 140 mg/dL      Peak postprandial:   less than 180 mg/dL (1-2 hours)      Critically ill patients:  140 - 180 mg/dL   Lab Results  Component Value Date   GLUCAP 210 (H) 08/14/2017   HGBA1C 6.9 (H) 07/05/2017     Review of Glycemic Control  Diabetes history: Type 2 Outpatient Diabetes medications: Lantus 24 units daily, Novolog 15 units BID at lunch and supper Current orders for Inpatient glycemic control: Novolog SENSITIVE TID & HS  Inpatient Diabetes Program Recommendations:   Recommend starting Lantus 20 units daily and continuing Novolog SENSITIVE correction scale TID  & HS.  Titrate dosage as needed.   Harvel Ricks RN BSN CDE Diabetes Coordinator Pager: [phone]  8am-5pm

## 2017-08-14 NOTE — Progress Notes (Signed)
Patient was alert and oriented times 4 this morning, but found mid morning in his bed on his right side with stool incontinence. He remained oriented to the day, time, place, and situation. Patient had a hand full of stool and had thrown two lumps of stool on the floor next the bed.  There was stool throughout his bed linens and on the railings as well.  Patient admitted that he had to go and asked for a diaper.  I explained that we do not use diapers because of the possible skin issues, reviewed several times the use of the call button and he demonstrated which button to call if he had to void or have another bowel movement.  Patient had a condom cath on also today at which point he discontinued it on his own.  This was replaced and the patient has been asking and demanding to get out of here.  Discharge orders are now written, notification to social worker to arrange transport has been made, report has been called to [place] at [facility] (Skilled).  Patient has been 100% on 3 Liters, he refused CPAP last night, there are no CPAP dc orders.

## 2017-08-17 ENCOUNTER — Other Ambulatory Visit: Payer: Self-pay | Admitting: Licensed Clinical Social Worker

## 2017-08-17 DIAGNOSIS — K219 Gastro-esophageal reflux disease without esophagitis: Secondary | ICD-10-CM | POA: Diagnosis not present

## 2017-08-17 DIAGNOSIS — I509 Heart failure, unspecified: Secondary | ICD-10-CM | POA: Diagnosis not present

## 2017-08-17 DIAGNOSIS — J156 Pneumonia due to other aerobic Gram-negative bacteria: Secondary | ICD-10-CM | POA: Diagnosis not present

## 2017-08-17 DIAGNOSIS — E088 Diabetes mellitus due to underlying condition with unspecified complications: Secondary | ICD-10-CM | POA: Diagnosis not present

## 2017-08-17 DIAGNOSIS — I150 Renovascular hypertension: Secondary | ICD-10-CM | POA: Diagnosis not present

## 2017-08-17 DIAGNOSIS — I5032 Chronic diastolic (congestive) heart failure: Secondary | ICD-10-CM | POA: Diagnosis not present

## 2017-08-17 DIAGNOSIS — J9621 Acute and chronic respiratory failure with hypoxia: Secondary | ICD-10-CM | POA: Diagnosis not present

## 2017-08-17 DIAGNOSIS — F0150 Vascular dementia without behavioral disturbance: Secondary | ICD-10-CM | POA: Diagnosis not present

## 2017-08-17 DIAGNOSIS — R41841 Cognitive communication deficit: Secondary | ICD-10-CM | POA: Diagnosis not present

## 2017-08-17 DIAGNOSIS — M2450 Contracture, unspecified joint: Secondary | ICD-10-CM | POA: Diagnosis not present

## 2017-08-17 DIAGNOSIS — R1312 Dysphagia, oropharyngeal phase: Secondary | ICD-10-CM | POA: Diagnosis not present

## 2017-08-17 DIAGNOSIS — R293 Abnormal posture: Secondary | ICD-10-CM | POA: Diagnosis not present

## 2017-08-17 DIAGNOSIS — M109 Gout, unspecified: Secondary | ICD-10-CM | POA: Diagnosis not present

## 2017-08-17 NOTE — Patient Outreach (Signed)
[facility]) Care Management  08/17/2017  DYKE WEIBLE April 11, 1950 [id number]  [facility] CSW received notification that patient has discharged from hospital back to [facility]F. [facility] CSW sent secure email to SNF social work to coordinate care. [facility] CSW will follow up at SNF within two weeks.  Eula Fried, BSW, MSW, [email or web address] Phone: [phone] Fax: [phone]

## 2017-08-18 ENCOUNTER — Other Ambulatory Visit: Payer: Self-pay | Admitting: Licensed Clinical Social Worker

## 2017-08-18 DIAGNOSIS — R293 Abnormal posture: Secondary | ICD-10-CM | POA: Diagnosis not present

## 2017-08-18 DIAGNOSIS — R1312 Dysphagia, oropharyngeal phase: Secondary | ICD-10-CM | POA: Diagnosis not present

## 2017-08-18 DIAGNOSIS — K219 Gastro-esophageal reflux disease without esophagitis: Secondary | ICD-10-CM | POA: Diagnosis not present

## 2017-08-18 DIAGNOSIS — E088 Diabetes mellitus due to underlying condition with unspecified complications: Secondary | ICD-10-CM | POA: Diagnosis not present

## 2017-08-18 DIAGNOSIS — J9621 Acute and chronic respiratory failure with hypoxia: Secondary | ICD-10-CM | POA: Diagnosis not present

## 2017-08-18 DIAGNOSIS — I509 Heart failure, unspecified: Secondary | ICD-10-CM | POA: Diagnosis not present

## 2017-08-18 LAB — CULTURE, BLOOD (ROUTINE X 2)
CULTURE: NO GROWTH
Culture: NO GROWTH

## 2017-08-18 NOTE — Patient Outreach (Signed)
[facility] Care Management  08/18/2017  Leonard [name] 28-Oct-1950 [id number]  [facility] CSW arrived at [name] and met with patient successfully and completed PAC Consult. Patient was in the middle of speech therapy but speech therapist encouraged [facility] CSW to stay and patient was agreeable to joint visit. Speech therapist reports that patient has been on a puree diet since he returned back from the hospital but will start on a mechanical soft diet today. Speech therapist reports that patient was very sleepy since returning back from hospital and has not been very alert and responsive until yesterday. Patient is very alert today compared to [facility] CSW's last visit. Patient answers all questions when prompted. Patient reports that he plans to remain at facility long term. Speech therapist reports that when she walked in, patient was not using oxygen. Speech therapist reported that she completed a mini-mental exam with patient and he scored a 27 out of 43. Patient reports feeling a lot better now that he is back from hospital and settled. He denies being in any pain or discomfort and shares that his appetite has been good for the most part. [facility] CSW met with SNF social worker after visit and was informed that patient's plan is to transition to long term care once his skilled days are completed. [facility] CSW will continue to follow patient and provide social work support and assistance as needed.  Eula Fried, BSW, MSW, [email or web address] Phone: [phone] Fax: [phone]

## 2017-08-19 DIAGNOSIS — K219 Gastro-esophageal reflux disease without esophagitis: Secondary | ICD-10-CM | POA: Diagnosis not present

## 2017-08-19 DIAGNOSIS — E088 Diabetes mellitus due to underlying condition with unspecified complications: Secondary | ICD-10-CM | POA: Diagnosis not present

## 2017-08-19 DIAGNOSIS — I509 Heart failure, unspecified: Secondary | ICD-10-CM | POA: Diagnosis not present

## 2017-08-19 DIAGNOSIS — R293 Abnormal posture: Secondary | ICD-10-CM | POA: Diagnosis not present

## 2017-08-19 DIAGNOSIS — J9621 Acute and chronic respiratory failure with hypoxia: Secondary | ICD-10-CM | POA: Diagnosis not present

## 2017-08-19 DIAGNOSIS — R1312 Dysphagia, oropharyngeal phase: Secondary | ICD-10-CM | POA: Diagnosis not present

## 2017-08-20 DIAGNOSIS — K219 Gastro-esophageal reflux disease without esophagitis: Secondary | ICD-10-CM | POA: Diagnosis not present

## 2017-08-20 DIAGNOSIS — F0150 Vascular dementia without behavioral disturbance: Secondary | ICD-10-CM | POA: Diagnosis not present

## 2017-08-20 DIAGNOSIS — R1312 Dysphagia, oropharyngeal phase: Secondary | ICD-10-CM | POA: Diagnosis not present

## 2017-08-20 DIAGNOSIS — J9621 Acute and chronic respiratory failure with hypoxia: Secondary | ICD-10-CM | POA: Diagnosis not present

## 2017-08-20 DIAGNOSIS — R293 Abnormal posture: Secondary | ICD-10-CM | POA: Diagnosis not present

## 2017-08-20 DIAGNOSIS — J156 Pneumonia due to other aerobic Gram-negative bacteria: Secondary | ICD-10-CM | POA: Diagnosis not present

## 2017-08-20 DIAGNOSIS — E088 Diabetes mellitus due to underlying condition with unspecified complications: Secondary | ICD-10-CM | POA: Diagnosis not present

## 2017-08-20 DIAGNOSIS — I5032 Chronic diastolic (congestive) heart failure: Secondary | ICD-10-CM | POA: Diagnosis not present

## 2017-08-20 DIAGNOSIS — I509 Heart failure, unspecified: Secondary | ICD-10-CM | POA: Diagnosis not present

## 2017-08-21 DIAGNOSIS — I509 Heart failure, unspecified: Secondary | ICD-10-CM | POA: Diagnosis not present

## 2017-08-21 DIAGNOSIS — K219 Gastro-esophageal reflux disease without esophagitis: Secondary | ICD-10-CM | POA: Diagnosis not present

## 2017-08-21 DIAGNOSIS — R293 Abnormal posture: Secondary | ICD-10-CM | POA: Diagnosis not present

## 2017-08-21 DIAGNOSIS — J9621 Acute and chronic respiratory failure with hypoxia: Secondary | ICD-10-CM | POA: Diagnosis not present

## 2017-08-21 DIAGNOSIS — E088 Diabetes mellitus due to underlying condition with unspecified complications: Secondary | ICD-10-CM | POA: Diagnosis not present

## 2017-08-21 DIAGNOSIS — R1312 Dysphagia, oropharyngeal phase: Secondary | ICD-10-CM | POA: Diagnosis not present

## 2017-08-23 DIAGNOSIS — R293 Abnormal posture: Secondary | ICD-10-CM | POA: Diagnosis not present

## 2017-08-23 DIAGNOSIS — R1312 Dysphagia, oropharyngeal phase: Secondary | ICD-10-CM | POA: Diagnosis not present

## 2017-08-23 DIAGNOSIS — E088 Diabetes mellitus due to underlying condition with unspecified complications: Secondary | ICD-10-CM | POA: Diagnosis not present

## 2017-08-23 DIAGNOSIS — K219 Gastro-esophageal reflux disease without esophagitis: Secondary | ICD-10-CM | POA: Diagnosis not present

## 2017-08-23 DIAGNOSIS — J9621 Acute and chronic respiratory failure with hypoxia: Secondary | ICD-10-CM | POA: Diagnosis not present

## 2017-08-23 DIAGNOSIS — I509 Heart failure, unspecified: Secondary | ICD-10-CM | POA: Diagnosis not present

## 2017-08-24 DIAGNOSIS — R1312 Dysphagia, oropharyngeal phase: Secondary | ICD-10-CM | POA: Diagnosis not present

## 2017-08-24 DIAGNOSIS — J9621 Acute and chronic respiratory failure with hypoxia: Secondary | ICD-10-CM | POA: Diagnosis not present

## 2017-08-24 DIAGNOSIS — R293 Abnormal posture: Secondary | ICD-10-CM | POA: Diagnosis not present

## 2017-08-24 DIAGNOSIS — I509 Heart failure, unspecified: Secondary | ICD-10-CM | POA: Diagnosis not present

## 2017-08-24 DIAGNOSIS — K219 Gastro-esophageal reflux disease without esophagitis: Secondary | ICD-10-CM | POA: Diagnosis not present

## 2017-08-24 DIAGNOSIS — E088 Diabetes mellitus due to underlying condition with unspecified complications: Secondary | ICD-10-CM | POA: Diagnosis not present

## 2017-08-25 ENCOUNTER — Inpatient Hospital Stay (HOSPITAL_COMMUNITY)
Admission: EM | Admit: 2017-08-25 | Discharge: 2017-08-30 | DRG: 291 | Disposition: A | Discharge status: Skilled Nursing Facility | Payer: Medicare Other | Attending: Family Medicine | Admitting: Family Medicine

## 2017-08-25 ENCOUNTER — Other Ambulatory Visit: Payer: Self-pay

## 2017-08-25 ENCOUNTER — Emergency Department (HOSPITAL_COMMUNITY): Payer: Medicare Other

## 2017-08-25 ENCOUNTER — Encounter (HOSPITAL_COMMUNITY): Payer: Self-pay | Admitting: Internal Medicine

## 2017-08-25 DIAGNOSIS — I2722 Pulmonary hypertension due to left heart disease: Secondary | ICD-10-CM | POA: Diagnosis present

## 2017-08-25 DIAGNOSIS — R0902 Hypoxemia: Secondary | ICD-10-CM | POA: Diagnosis not present

## 2017-08-25 DIAGNOSIS — J181 Lobar pneumonia, unspecified organism: Secondary | ICD-10-CM | POA: Diagnosis not present

## 2017-08-25 DIAGNOSIS — I10 Essential (primary) hypertension: Secondary | ICD-10-CM

## 2017-08-25 DIAGNOSIS — J9811 Atelectasis: Secondary | ICD-10-CM | POA: Diagnosis present

## 2017-08-25 DIAGNOSIS — N189 Chronic kidney disease, unspecified: Secondary | ICD-10-CM

## 2017-08-25 DIAGNOSIS — N400 Enlarged prostate without lower urinary tract symptoms: Secondary | ICD-10-CM | POA: Diagnosis not present

## 2017-08-25 DIAGNOSIS — J90 Pleural effusion, not elsewhere classified: Secondary | ICD-10-CM | POA: Diagnosis not present

## 2017-08-25 DIAGNOSIS — E11319 Type 2 diabetes mellitus with unspecified diabetic retinopathy without macular edema: Secondary | ICD-10-CM | POA: Diagnosis not present

## 2017-08-25 DIAGNOSIS — E1142 Type 2 diabetes mellitus with diabetic polyneuropathy: Secondary | ICD-10-CM | POA: Diagnosis not present

## 2017-08-25 DIAGNOSIS — E782 Mixed hyperlipidemia: Secondary | ICD-10-CM | POA: Diagnosis present

## 2017-08-25 DIAGNOSIS — Z89612 Acquired absence of left leg above knee: Secondary | ICD-10-CM | POA: Diagnosis not present

## 2017-08-25 DIAGNOSIS — J9611 Chronic respiratory failure with hypoxia: Secondary | ICD-10-CM | POA: Diagnosis not present

## 2017-08-25 DIAGNOSIS — I132 Hypertensive heart and chronic kidney disease with heart failure and with stage 5 chronic kidney disease, or end stage renal disease: Secondary | ICD-10-CM | POA: Diagnosis not present

## 2017-08-25 DIAGNOSIS — J449 Chronic obstructive pulmonary disease, unspecified: Secondary | ICD-10-CM | POA: Diagnosis not present

## 2017-08-25 DIAGNOSIS — R0689 Other abnormalities of breathing: Secondary | ICD-10-CM | POA: Diagnosis not present

## 2017-08-25 DIAGNOSIS — Z86718 Personal history of other venous thrombosis and embolism: Secondary | ICD-10-CM

## 2017-08-25 DIAGNOSIS — Y95 Nosocomial condition: Secondary | ICD-10-CM | POA: Diagnosis present

## 2017-08-25 DIAGNOSIS — E118 Type 2 diabetes mellitus with unspecified complications: Secondary | ICD-10-CM | POA: Diagnosis present

## 2017-08-25 DIAGNOSIS — J9601 Acute respiratory failure with hypoxia: Secondary | ICD-10-CM | POA: Diagnosis not present

## 2017-08-25 DIAGNOSIS — Z85828 Personal history of other malignant neoplasm of skin: Secondary | ICD-10-CM

## 2017-08-25 DIAGNOSIS — E039 Hypothyroidism, unspecified: Secondary | ICD-10-CM | POA: Diagnosis not present

## 2017-08-25 DIAGNOSIS — Z794 Long term (current) use of insulin: Secondary | ICD-10-CM

## 2017-08-25 DIAGNOSIS — D631 Anemia in chronic kidney disease: Secondary | ICD-10-CM | POA: Diagnosis present

## 2017-08-25 DIAGNOSIS — Z7982 Long term (current) use of aspirin: Secondary | ICD-10-CM

## 2017-08-25 DIAGNOSIS — Z880 Allergy status to penicillin: Secondary | ICD-10-CM | POA: Diagnosis not present

## 2017-08-25 DIAGNOSIS — Z9981 Dependence on supplemental oxygen: Secondary | ICD-10-CM

## 2017-08-25 DIAGNOSIS — I2721 Secondary pulmonary arterial hypertension: Secondary | ICD-10-CM

## 2017-08-25 DIAGNOSIS — I2510 Atherosclerotic heart disease of native coronary artery without angina pectoris: Secondary | ICD-10-CM | POA: Diagnosis not present

## 2017-08-25 DIAGNOSIS — E1151 Type 2 diabetes mellitus with diabetic peripheral angiopathy without gangrene: Secondary | ICD-10-CM | POA: Diagnosis not present

## 2017-08-25 DIAGNOSIS — Z951 Presence of aortocoronary bypass graft: Secondary | ICD-10-CM | POA: Diagnosis not present

## 2017-08-25 DIAGNOSIS — I5033 Acute on chronic diastolic (congestive) heart failure: Secondary | ICD-10-CM | POA: Diagnosis present

## 2017-08-25 DIAGNOSIS — Z79899 Other long term (current) drug therapy: Secondary | ICD-10-CM

## 2017-08-25 DIAGNOSIS — E1122 Type 2 diabetes mellitus with diabetic chronic kidney disease: Secondary | ICD-10-CM | POA: Diagnosis present

## 2017-08-25 DIAGNOSIS — E876 Hypokalemia: Secondary | ICD-10-CM | POA: Diagnosis present

## 2017-08-25 DIAGNOSIS — M1A0720 Idiopathic chronic gout, left ankle and foot, without tophus (tophi): Secondary | ICD-10-CM | POA: Diagnosis not present

## 2017-08-25 DIAGNOSIS — Z87891 Personal history of nicotine dependence: Secondary | ICD-10-CM

## 2017-08-25 DIAGNOSIS — J440 Chronic obstructive pulmonary disease with acute lower respiratory infection: Secondary | ICD-10-CM | POA: Diagnosis not present

## 2017-08-25 DIAGNOSIS — J189 Pneumonia, unspecified organism: Secondary | ICD-10-CM | POA: Diagnosis not present

## 2017-08-25 DIAGNOSIS — R0602 Shortness of breath: Secondary | ICD-10-CM

## 2017-08-25 DIAGNOSIS — I959 Hypotension, unspecified: Secondary | ICD-10-CM | POA: Diagnosis not present

## 2017-08-25 DIAGNOSIS — I159 Secondary hypertension, unspecified: Secondary | ICD-10-CM | POA: Diagnosis present

## 2017-08-25 DIAGNOSIS — N184 Chronic kidney disease, stage 4 (severe): Secondary | ICD-10-CM | POA: Diagnosis not present

## 2017-08-25 DIAGNOSIS — N179 Acute kidney failure, unspecified: Secondary | ICD-10-CM | POA: Diagnosis not present

## 2017-08-25 DIAGNOSIS — E440 Moderate protein-calorie malnutrition: Secondary | ICD-10-CM | POA: Diagnosis present

## 2017-08-25 DIAGNOSIS — N185 Chronic kidney disease, stage 5: Secondary | ICD-10-CM | POA: Diagnosis present

## 2017-08-25 HISTORY — DX: Type 2 diabetes mellitus with other skin complications: E11.628

## 2017-08-25 HISTORY — DX: Type 2 diabetes mellitus with unspecified complications: E11.8

## 2017-08-25 HISTORY — DX: Chronic kidney disease, stage 4 (severe): N18.4

## 2017-08-25 HISTORY — DX: Secondary pulmonary arterial hypertension: I27.21

## 2017-08-25 HISTORY — DX: Long term (current) use of anticoagulants: Z79.01

## 2017-08-25 HISTORY — DX: Type 2 diabetes mellitus with diabetic polyneuropathy: E11.42

## 2017-08-25 HISTORY — DX: Presence of aortocoronary bypass graft: Z95.1

## 2017-08-25 HISTORY — DX: Chronic obstructive pulmonary disease, unspecified: J44.9

## 2017-08-25 HISTORY — DX: Hypothyroidism, unspecified: E03.9

## 2017-08-25 HISTORY — DX: Other acquired deformities of left foot: M21.6X2

## 2017-08-25 HISTORY — DX: Type 2 diabetes mellitus without complications: E11.9

## 2017-08-25 HISTORY — DX: Pneumonia, unspecified organism: J18.9

## 2017-08-25 HISTORY — DX: Chronic respiratory failure with hypoxia: J96.11

## 2017-08-25 HISTORY — DX: Atherosclerosis of native arteries of extremities with gangrene, unspecified extremity: I70.269

## 2017-08-25 HISTORY — DX: Anemia, unspecified: D64.9

## 2017-08-25 HISTORY — DX: Hematuria, unspecified: R31.9

## 2017-08-25 HISTORY — DX: Morbid (severe) obesity due to excess calories: E66.01

## 2017-08-25 HISTORY — DX: Chronic kidney disease, unspecified: N18.9

## 2017-08-25 HISTORY — DX: Mixed hyperlipidemia: E78.2

## 2017-08-25 HISTORY — DX: Respiratory failure, unspecified, unspecified whether with hypoxia or hypercapnia: J96.90

## 2017-08-25 HISTORY — DX: Idiopathic chronic gout, left ankle and foot, without tophus (tophi): M1A.0720

## 2017-08-25 HISTORY — DX: Corns and callosities: L84

## 2017-08-25 HISTORY — DX: Acute on chronic diastolic (congestive) heart failure: I50.33

## 2017-08-25 HISTORY — DX: Essential (primary) hypertension: I10

## 2017-08-25 HISTORY — DX: Pain in left foot: M79.672

## 2017-08-25 HISTORY — DX: Disorder of the skin and subcutaneous tissue, unspecified: L98.9

## 2017-08-25 HISTORY — DX: Type 2 diabetes mellitus with unspecified diabetic retinopathy without macular edema: E11.319

## 2017-08-25 HISTORY — DX: Anemia in chronic kidney disease: D63.1

## 2017-08-25 HISTORY — DX: Personal history of nicotine dependence: Z87.891

## 2017-08-25 HISTORY — DX: Pleural effusion, not elsewhere classified: J90

## 2017-08-25 HISTORY — DX: Atherosclerotic heart disease of native coronary artery without angina pectoris: I25.10

## 2017-08-25 HISTORY — DX: Chronic embolism and thrombosis of left femoral vein: I82.512

## 2017-08-25 HISTORY — DX: Peripheral vascular disease, unspecified: I73.9

## 2017-08-25 HISTORY — DX: Secondary hypertension, unspecified: I15.9

## 2017-08-25 HISTORY — DX: Shortness of breath: R06.02

## 2017-08-25 LAB — I-STAT VENOUS BLOOD GAS, ED
ACID-BASE DEFICIT: 4 mmol/L — AB (ref 0.0–2.0)
BICARBONATE: 21.8 mmol/L (ref 20.0–28.0)
O2 SAT: 55 %
TCO2: 23 mmol/L (ref 22–32)
pCO2, Ven: 41.6 mmHg — ABNORMAL LOW (ref 44.0–60.0)
pH, Ven: 7.327 (ref 7.250–7.430)
pO2, Ven: 31 mmHg — CL (ref 32.0–45.0)

## 2017-08-25 LAB — BASIC METABOLIC PANEL
Anion gap: 10 (ref 5–15)
BUN: 38 mg/dL — AB (ref 8–23)
CALCIUM: 8.6 mg/dL — AB (ref 8.9–10.3)
CHLORIDE: 109 mmol/L (ref 98–111)
CO2: 21 mmol/L — AB (ref 22–32)
CREATININE: 2.91 mg/dL — AB (ref 0.61–1.24)
GFR calc Af Amer: 24 mL/min — ABNORMAL LOW (ref >60)
GFR calc non Af Amer: 21 mL/min — ABNORMAL LOW (ref >60)
Glucose, Bld: 150 mg/dL — ABNORMAL HIGH (ref 70–99)
Potassium: 4.7 mmol/L (ref 3.5–5.1)
Sodium: 140 mmol/L (ref 135–145)

## 2017-08-25 LAB — GLUCOSE, CAPILLARY
Glucose-Capillary: 122 mg/dL — ABNORMAL HIGH (ref 70–99)
Glucose-Capillary: 105 mg/dL — ABNORMAL HIGH (ref 70–99)

## 2017-08-25 LAB — CBC
HEMATOCRIT: 26.6 % — AB (ref 39.0–52.0)
HEMOGLOBIN: 8.1 g/dL — AB (ref 13.0–17.0)
MCH: 27.5 pg (ref 26.0–34.0)
MCHC: 30.5 g/dL (ref 30.0–36.0)
MCV: 90.2 fL (ref 78.0–100.0)
Platelets: 184 10*3/uL (ref 150–400)
RBC: 2.95 MIL/uL — ABNORMAL LOW (ref 4.22–5.81)
RDW: 17 % — AB (ref 11.5–15.5)
WBC: 7.7 10*3/uL (ref 4.0–10.5)

## 2017-08-25 LAB — I-STAT CG4 LACTIC ACID, ED: Lactic Acid, Venous: 0.92 mmol/L (ref 0.5–1.9)

## 2017-08-25 LAB — TROPONIN I: Troponin I: <0.03 ng/mL (ref <0.03)

## 2017-08-25 LAB — BRAIN NATRIURETIC PEPTIDE: B Natriuretic Peptide: 526.9 pg/mL — ABNORMAL HIGH (ref 0.0–100.0)

## 2017-08-25 MED ORDER — PANTOPRAZOLE SODIUM 40 MG PO TBEC: 40.0000 mg | DELAYED_RELEASE_TABLET | Freq: Every day | ORAL | Status: DC

## 2017-08-25 MED ORDER — NITROGLYCERIN 0.4 MG SL SUBL: 0.4000 mg | SUBLINGUAL_TABLET | SUBLINGUAL | Status: DC | PRN

## 2017-08-25 MED ORDER — SODIUM CHLORIDE 0.9% FLUSH
3.0000 mL | Freq: Two times a day (BID) | INTRAVENOUS | Status: DC
  Administered 2017-08-25 – 2017-08-30 (×11): 3 mL via INTRAVENOUS

## 2017-08-25 MED ORDER — ACETAMINOPHEN 325 MG PO TABS: 650.0000 mg | ORAL_TABLET | ORAL | Status: DC | PRN

## 2017-08-25 MED ORDER — ISOSORBIDE MONONITRATE ER 60 MG PO TB24
60.0000 mg | ORAL_TABLET | Freq: Every day | ORAL | Status: DC
  Administered 2017-08-25 – 2017-08-29 (×5): 60 mg via ORAL
  Filled 2017-08-25 (×5): qty 1

## 2017-08-25 MED ORDER — FUROSEMIDE 10 MG/ML IJ SOLN
60.0000 mg | Freq: Once | INTRAMUSCULAR | Status: AC
  Administered 2017-08-25: 60 mg via INTRAVENOUS
  Filled 2017-08-25: qty 6

## 2017-08-25 MED ORDER — ENSURE ENLIVE PO LIQD
237.0000 mL | Freq: Three times a day (TID) | ORAL | Status: DC
  Administered 2017-08-25 – 2017-08-30 (×15): 237 mL via ORAL

## 2017-08-25 MED ORDER — SODIUM CHLORIDE 0.9 % IV SOLN
2.0000 g | Freq: Once | INTRAVENOUS | Status: AC
  Administered 2017-08-25: 2 g via INTRAVENOUS
  Filled 2017-08-25: qty 2

## 2017-08-25 MED ORDER — NITROGLYCERIN 2 % TD OINT: 1.0000 [in_us] | TOPICAL_OINTMENT | Freq: Once | TRANSDERMAL | Status: DC

## 2017-08-25 MED ORDER — ENOXAPARIN SODIUM 30 MG/0.3ML ~~LOC~~ SOLN
30.0000 mg | SUBCUTANEOUS | Status: DC
  Administered 2017-08-25 – 2017-08-29 (×5): 30 mg via SUBCUTANEOUS
  Filled 2017-08-25 (×5): qty 0.3

## 2017-08-25 MED ORDER — CARVEDILOL 25 MG PO TABS
50.0000 mg | ORAL_TABLET | Freq: Two times a day (BID) | ORAL | Status: DC
  Administered 2017-08-25: 50 mg via ORAL
  Filled 2017-08-25: qty 2

## 2017-08-25 MED ORDER — POLYETHYLENE GLYCOL 3350 17 G PO PACK: 17.0000 g | PACK | Freq: Every day | ORAL | Status: DC | PRN

## 2017-08-25 MED ORDER — ONDANSETRON HCL 4 MG/2ML IJ SOLN: 4.0000 mg | Freq: Four times a day (QID) | INTRAMUSCULAR | Status: DC | PRN

## 2017-08-25 MED ORDER — ASPIRIN EC 81 MG PO TBEC
81.0000 mg | DELAYED_RELEASE_TABLET | Freq: Every day | ORAL | Status: DC
  Administered 2017-08-25 – 2017-08-30 (×6): 81 mg via ORAL
  Filled 2017-08-25 (×6): qty 1

## 2017-08-25 MED ORDER — ATORVASTATIN CALCIUM 40 MG PO TABS
40.0000 mg | ORAL_TABLET | Freq: Every day | ORAL | Status: DC
  Administered 2017-08-26 – 2017-08-29 (×4): 40 mg via ORAL
  Filled 2017-08-25 (×4): qty 1

## 2017-08-25 MED ORDER — LEVOTHYROXINE SODIUM 75 MCG PO TABS
75.0000 ug | ORAL_TABLET | Freq: Every day | ORAL | Status: DC
  Administered 2017-08-26 – 2017-08-30 (×5): 75 ug via ORAL
  Filled 2017-08-25 (×6): qty 1

## 2017-08-25 MED ORDER — IPRATROPIUM-ALBUTEROL 0.5-2.5 (3) MG/3ML IN SOLN
3.0000 mL | Freq: Four times a day (QID) | RESPIRATORY_TRACT | Status: DC | PRN
  Administered 2017-08-27: 3 mL via RESPIRATORY_TRACT
  Filled 2017-08-25 (×2): qty 3

## 2017-08-25 MED ORDER — SODIUM CHLORIDE 0.9 % IV SOLN
250.0000 mL | INTRAVENOUS | Status: DC | PRN
  Administered 2017-08-27 – 2017-08-30 (×5): 250 mL via INTRAVENOUS

## 2017-08-25 MED ORDER — INSULIN ASPART 100 UNIT/ML ~~LOC~~ SOLN
0.0000 [IU] | Freq: Three times a day (TID) | SUBCUTANEOUS | Status: DC
  Administered 2017-08-25: 1 [IU] via SUBCUTANEOUS
  Administered 2017-08-26: 3 [IU] via SUBCUTANEOUS
  Administered 2017-08-26 (×2): 1 [IU] via SUBCUTANEOUS
  Administered 2017-08-27: 2 [IU] via SUBCUTANEOUS
  Administered 2017-08-27 (×2): 1 [IU] via SUBCUTANEOUS
  Administered 2017-08-28: 2 [IU] via SUBCUTANEOUS
  Administered 2017-08-28: 1 [IU] via SUBCUTANEOUS
  Administered 2017-08-28: 2 [IU] via SUBCUTANEOUS
  Administered 2017-08-29: 3 [IU] via SUBCUTANEOUS
  Administered 2017-08-29: 1 [IU] via SUBCUTANEOUS
  Administered 2017-08-30: 3 [IU] via SUBCUTANEOUS
  Administered 2017-08-30: 1 [IU] via SUBCUTANEOUS

## 2017-08-25 MED ORDER — CLONIDINE HCL 0.2 MG/24HR TD PTWK
0.2000 mg | MEDICATED_PATCH | TRANSDERMAL | Status: DC
  Administered 2017-08-25: 0.2 mg via TRANSDERMAL
  Filled 2017-08-25: qty 1

## 2017-08-25 MED ORDER — FERROUS SULFATE 325 (65 FE) MG PO TABS
325.0000 mg | ORAL_TABLET | Freq: Every day | ORAL | Status: DC
  Administered 2017-08-26 – 2017-08-30 (×5): 325 mg via ORAL
  Filled 2017-08-25 (×5): qty 1

## 2017-08-25 MED ORDER — SODIUM CHLORIDE 0.9% FLUSH: 3.0000 mL | INTRAVENOUS | Status: DC | PRN

## 2017-08-25 NOTE — ED Provider Notes (Signed)
[facility] DEPT Provider Note MRN:  [id number]  Arrival date & time: 08/25/17     Chief Complaint   Shortness of Breath   History of Present Illness   Leonard [name] is a 67 y.o. year-old male with a history of CHF, CKD, recent pneumonia presenting to the ED with chief complaint of shortness of breath.  Patient was sent here from his facility given concern for respiratory distress.  Patient explains that he is been experiencing progressively worsening shortness of breath for the past week.  Denies chest pain at this time.  Endorsing worsening cough, denies fevers.  The shortness of breath is located in the chest and described as a moderate sensation, inability to catch breath.  Per chart review, patient was recently admitted for a community acquired pneumonia, treated with Levaquin.  Patient denies abdominal pain, dysuria.  I was unable to obtain a clear and accurate HPI, PMH, or ROS due to the patient's respiratory distress.  Review of Systems  Positive for shortness of breath, cough.   Patient's Health History    Past Medical History:  Diagnosis Date  . Cancer ([place])    skin cancer-  . CHF (congestive heart failure) ([place])   . CKD (chronic kidney disease), stage IV ([place])    07/07/2017  . Coronary artery disease   . Gangrene of toe ([place])    right 5th/notes 01/04/2013   . High cholesterol   . Hypertension   . Iron deficiency anemia 08/03/2015  . 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 PAD (peripheral artery disease) ([place])   . Shortness of breath dyspnea    with exertion  . Type II diabetes mellitus ([place])    Type II    Past Surgical History:  Procedure Laterality Date  . ABDOMINAL AORTOGRAM W/LOWER EXTREMITY N/A 03/12/2017   Procedure: ABDOMINAL AORTOGRAM W/LOWER EXTREMITY;  Surgeon: Conrad Chester, MD;  Location: [place] [facility];  Service: Cardiovascular;  Laterality: N/A;  . AMPUTATION Right 01/28/2013   Procedure:  RAY AMPUTATION RIGHT  4th & 5th TOE;  Surgeon: Rosetta Posner, MD;  Location: [facility];  Service: Vascular;  Laterality: Right;  . AMPUTATION Left 03/15/2017   Procedure: AMPUTATION ABOVE KNEE;  Surgeon: Waynetta Sandy, MD;  Location: [place];  Service: Vascular;  Laterality: Left;  . ANGIOPLASTY / STENTING FEMORAL Right 01/13/2013   superficial femoral artery x 2 (3 mm x 60 mm, 4 mm x 60 mm)  . AV FISTULA PLACEMENT Left 02/11/2016   Procedure: LEFT UPPER ARM BRACHIAL CEPHALIC ARTERIOVENOUS (AV) FISTULA CREATION;  Surgeon: Conrad [place], MD;  Location: [place];  Service: Vascular;  Laterality: Left;  . COLONOSCOPY W/ POLYPECTOMY    . CORONARY ARTERY BYPASS GRAFT N/A 04/07/2013   Procedure: CORONARY ARTERY BYPASS GRAFTING (CABG);  Surgeon: Ivin Poot, MD;  Location: [place];  Service: Open Heart Surgery;  Laterality: N/A;  . INTRAOPERATIVE TRANSESOPHAGEAL ECHOCARDIOGRAM N/A 04/07/2013   Procedure: INTRAOPERATIVE TRANSESOPHAGEAL ECHOCARDIOGRAM;  Surgeon: Ivin Poot, MD;  Location: [place];  Service: Open Heart Surgery;  Laterality: N/A;  . LEFT HEART CATHETERIZATION WITH CORONARY ANGIOGRAM N/A 04/05/2013   Procedure: LEFT HEART CATHETERIZATION WITH CORONARY ANGIOGRAM;  Surgeon: Peter M Martinique, MD;  Location[facility] C[facility] LAB;  Service: Cardiovascular;  Laterality: N/A;  . LOWER EXTREMITY ANGIOGRAM Bilateral 01/06/2013   Procedure: LOWER EXTREMITY ANGIOGRAM;  Surgeon: Conrad [place], MD;  Location: [facility] CATH LAB;  Service: Cardiovascular;  Laterality: Bilateral;  .  LOWER EXTREMITY ANGIOGRAM Right 01/13/2013   Procedure: LOWER EXTREMITY ANGIOGRAM;  Surgeon: Conrad Hamler, MD;  Location: [facility] CATH LAB;  Service: Cardiovascular;  Laterality: Right;  . SKIN CANCER EXCISION  2017  . THROAT SURGERY  1983   polyps  . TONSILLECTOMY AND ADENOIDECTOMY  ~ 1965    Family History  Problem Relation Age of Onset  . Cancer Mother        lung cancer  . Hypertension Mother   . Cancer Sister        patient thinks it was  uterine cancer  . Hypertension Sister   . Heart attack Sister   . Stroke Neg Hx     Social History   Socioeconomic History  . Marital status: Single    Spouse name: Not on file  . Number of children: Not on file  . Years of education: Not on file  . Highest education level: Not on file  Occupational History  . Not on file  Social Needs  . Financial resource strain: Not on file  . Food insecurity:    Worry: Not on file    Inability: Not on file  . Transportation needs:    Medical: Not on file    Non-medical: Not on file  Tobacco Use  . Smoking status: Former Smoker    Packs/day: 1.00    Years: 46.00    Pack years: 46.00    Types: Cigarettes    Start date: 02/04/1967    Last attempt to quit: 02/03/2013    Years since quitting: 4.5  . Smokeless tobacco: Never Used  Substance and Sexual Activity  . Alcohol use: No    Alcohol/week: 0.0 oz  . Drug use: No  . Sexual activity: Not Currently  Lifestyle  . Physical activity:    Days per week: Not on file    Minutes per session: Not on file  . Stress: Not on file  Relationships  . Social connections:    Talks on phone: Not on file    Gets together: Not on file    Attends religious service: Not on file    Active member of club or organization: Not on file    Attends meetings of clubs or organizations: Not on file    Relationship status: Not on file  . Intimate partner violence:    Fear of current or ex partner: Not on file    Emotionally abused: Not on file    Physically abused: Not on file    Forced sexual activity: Not on file  Other Topics Concern  . Not on file  Social History Narrative   Lives with sister, [name] in [place].  Retired - former Sports coach.   Patient in skilled nursing facility     Physical Exam  Vital Signs and Nursing Notes reviewed Vitals:   08/25/17 1205 08/25/17 1230  BP:  (!) 117/57  Pulse:  69  Resp:  20  Temp: (!) 96.9 F (36.1 C)   SpO2:  97%    CONSTITUTIONAL:  Ill-appearing, in mild to moderate respiratory distress on nonrebreather. NEURO:  Alert and oriented x 3, no focal deficits EYES:  eyes equal and reactive ENT/NECK:  no LAD, no JVD CARDIO: Regular rate, well-perfused, normal S1 and S2 PULM: Diffuse crackles most prominent in the bases, poor air movement throughout. GI/GU:  normal bowel sounds, non-distended, non-tender MSK/SPINE:  No gross deformities, 1+ pitting edema to the right lower extremity; left above-the-knee amputation, normal-appearing stump SKIN:  no rash, atraumatic PSYCH:  Appropriate speech and behavior  Diagnostic and Interventional Summary    EKG Interpretation  Date/Time:  Tuesday August 25 2017 09:41:20 EDT Ventricular Rate:  78 PR Interval:    QRS Duration: 104 QT Interval:  396 QTC Calculation: 452 R Axis:   69 Text Interpretation:  Sinus rhythm Baseline wander Confirmed by Lajean Saver [phone]) on 08/25/2017 9:55:09 AM      Labs Reviewed  CBC - Abnormal; Notable for the following components:      Result Value   RBC 2.95 (*)    Hemoglobin 8.1 (*)    HCT 26.6 (*)    RDW 17.0 (*)    All other components within normal limits  BASIC METABOLIC PANEL - Abnormal; Notable for the following components:   CO2 21 (*)    Glucose, Bld 150 (*)    BUN 38 (*)    Creatinine, Ser 2.91 (*)    Calcium 8.6 (*)    GFR calc non Af Amer 21 (*)    GFR calc Af Amer 24 (*)    All other components within normal limits  BRAIN NATRIURETIC PEPTIDE - Abnormal; Notable for the following components:   B Natriuretic Peptide 526.9 (*)    All other components within normal limits  I-STAT VENOUS BLOOD GAS, ED - Abnormal; Notable for the following components:   pCO2, Ven 41.6 (*)    pO2, Ven 31.0 (*)    Acid-base deficit 4.0 (*)    All other components within normal limits  CULTURE, BLOOD (SINGLE)  TROPONIN I  I-STAT CG4 LACTIC ACID, ED    DG Chest Port 1 View  Final Result      Medications  ceFEPIme (MAXIPIME) 2 g in sodium  chloride 0.9 % 100 mL IVPB (0 g Intravenous Stopped 08/25/17 1215)     Procedures: None  Critical Care Critical Care Documentation Critical care time provided by me (excluding procedures): 35 minutes  Condition necessitating critical care: Hypoxic respiratory failure  Components of critical care management: reviewing of prior records, CXR/blood gas interpretation, frequent re-examination and reassessment of vital signs, administration of high flow oxygen, IV antibiotics.   ED Course and Medical Decision Making  I have reviewed the triage vital signs and the nursing notes.  Pertinent labs & imaging results that were available during my care of the patient were reviewed by me and considered in my medical decision making (see below for details). Clinical Course as of Aug 25 1344  Tue Aug 26, 2655  [id number] 67 year old male coming from a facility with significant shortness of breath progressively worsening for the past week.  History of CHF.  Satting 79% on room air on arrival, requiring intermittent high flow nasal cannula and/or nonrebreather to maintain saturations above 90%.  Poor air movement and diffuse crackles throughout lungs.  Pitting edema in his right lower extremity, AKA on the left.  Considering CHF exacerbation versus pneumonia.  Mildly somnolent, will obtain VBG and CO2 level, labs, chest x-ray.   [MB]  1003 EKG with no ischemic changes, complaining of no chest pain today.   [MB]  2202 Upon further chart review, patient was admitted to the family medicine service for community acquired pneumonia, treated with Levaquin.  Chest x-ray today concerning for possible recurrence of pneumonia.  Antibiotic coverage broadened with cefepime today, family medicine consulted for admission.   [MB]    Clinical Course User Index [MB] Maudie Flakes, MD   Significant clinical improvement in the emergency department  today, transitioned off of nonrebreather onto 6 L nasal cannula.  With improved  oxygenation, patient's mental status improved.  Appropriate for the floor, to be admitted by family medicine.  Barth Kirks. Sedonia Small, MD [place] [email or web address]  Final Clinical Impressions(s) / ED Diagnoses     ICD-10-CM   1. Acute respiratory failure with hypoxia (HCC) J96.01   2. SOB (shortness of breath) R06.02 DG Chest [facility] 1 View    DG Chest [place] 1 View  3. Recurrent pneumonia J18.9     ED Discharge Orders    None         Maudie Flakes, MD 08/25/17 1348

## 2017-08-25 NOTE — H&P (Addendum)
[facility] Admission History and Physical Service Pager: [phone]  [name] Medical record number: [id number] Date of birth: [birth year] Age: 67 y.o. Gender: male  Primary Care Provider: Lind Covert, MD Consultants: None Code Status: Full  Chief Complaint: O2 desaturation  Assessment and Plan: Leonard [name] is a 67 y.o. male presenting with reported O2 desaturation per SNF. PMH is significant for COPD, CKD Stage 4, CAD s/p CABG, T2DM, HTN, Hypothyroidism, and HLD.  CHF Exacerbation: Acute on Chronic [facility] reported worsening shortness of breath over the last week.  Patient recently treated for HCAP and discharged on 7/12.  His sister notes that he has last night she noticed he was "more restless," but when she entered his room, his oxygen was not in his nose.  Last Echo 65-70% in June.  BNP 526 in ED, BNP's from last admissions in 300s. CXR showed worsening of right greater than left pleural effusions. On exam, crackles right basilar to mid lung, left basilar.  1+ pitting edema RLE.  Patient afebrile, sating at 94% on 2L, mildly tachypnic at 22, nontachycardic.  WBC 7.7.  Lactic Acid WNL, troponin negative x1.  EKG NSR.  Unlikely pneumonia due to lack of fever, white count.  Did receive Cefepime 2g IV once in ED.  No concern for sepsis.  Appears fluid overloaded on exam.   - Admit to medsurg, Dr. McDiarmid's service - Continuous Pulse Ox - continue O2 therapy, goal O2 sats 88-92%, baseline 2L - IV Lasix 34m once, consider more in AM following BMP for Cr monitoring.  Patient diuresed well with IV Lasix 676mon last admission. Will consider increase to BID pending Cr improvement from first dose. - hold home Torsemide while on Lasix - no need for antibiotics at this time, can consider if suspicion for pneumonia  AKI on Chronic Kidney Disease, Stage 4:  Baseline 1.6-1.8, 2.91 on admission. - recheck BMP in AM - giving IV Lasix  6020mnce  - consider consult Nephro as needed - avoid nephrotoxic medications  COPD No wheezing on exam.  Doubt COPD exacerbation. - cont duoneb q6h prn  Anemia: Chronic, Stable Hgb 8.1 on admission, was 7.5 on discharge on 7/12.  Likely anemia of chronic disease due to CKD and normal MCV. - Monitor CBC  Hypertension: Chronic On Torsemide, amlodipine, carvedilol, Imdur, clonidine, doxazosin, hydralazine at home.  BP 111/52 at present.  -hold torsemide as will be diuresed with Lasix -Hold amlodipine due to low-normal BP -Cont home coreg - cont home imdur - cont home clonidine - hold doxazosin - hold hydralazine - continue to monitor BP  T2DM: Controlled Glucose 150 on admission.  Last A1c 6.9 on 07/05/2017.  Home med list potentially innacurate, on Novolog 15 at lunch and dinner and Lantus 24 units QHS. -sSSI -monitor CBG AC/HS  HLD: Chronic On Lipitor 17m82m Cont home lipitor  Hypothyroidism: Stable Last TSH 3.338 on 07/10/2017.  On Synthroid 75mc95m home. -Cont home synthroid  Hx DVT, PAD On Eliquis in the past, discontinued at recent hospitalization.  On ASA 81 QD. - Cont home ASA  Hx Left Leg AKA: Stable - no intervention needed - PT/OT while inpatient  Gout: Chronic, no acute flare On allopurinol at home - hold allopurinol due to Cr increase  BPH On doxazosin at home. -hold doxazosin due to low-normal BP   FEN/GI: Heart Healthy Prophylaxis: Lovenox  Disposition: Admit to medsurg  History of Present Illness:  Leonard [name]  is a 67 y.o. male presenting with O2 desaturation.  The patient presented from [facility] with reported O2 desaturation to the 80s on 4L although on interview with daughter.  ED note also states that the facility noted the patient has had worsening shortness of breath for the past week as well as a worsening cough.  Patient's sister and niece present during examination.  His sister states that when she visited him last night he was  more "restless" than usual and "was having a hard time getting comfortable."  She also stated that when she entered his room, the patient did not have his oxygen in his nose.  Patient was discharged on 7/12 following treatment for HCAP.  Per history, no fevers.  Patient sleeping during most of interview and refusing to answer questions.  Review Of Systems: Per HPI with the following additions: Limited due to sister helping to provide history and patient not participating in history taking.  Review of Systems  Constitutional: Negative for chills and fever.  Respiratory: Positive for cough.   Cardiovascular: Positive for leg swelling.    Patient Active Problem List   Diagnosis Date Noted  . Respiratory failure ([place]) 08/13/2017  . Pneumonitis 07/26/2017  . Pulmonary artery hypertension ([place]) 07/25/2017  . HCAP (healthcare-associated pneumonia)   . Pressure injury of skin 07/20/2017  . Hypernatremia   . Acute exacerbation of CHF (congestive heart failure) ([place]) 07/05/2017  . Atherosclerosis of aorta with gangrene ([place]) 03/12/2017  . Atherosclerotic peripheral vascular disease with gangrene ([place]) 02/14/2017  . Encounter for vitamin deficiency screening 02/11/2017  . Collapse of left talus 01/14/2017  . Diabetic polyneuropathy associated with type 2 diabetes mellitus ([place]) 01/14/2017  . Type 2 diabetes mellitus with pressure callus ([place]) 12/29/2016  . Idiopathic chronic gout of left foot without tophus   . Left foot pain 10/29/2016  . Long term (current) use of anticoagulants [Z79.01] 09/09/2016  . Chronic deep vein thrombosis (DVT) of femoral vein of left lower extremity ([place]) 09/03/2016  . Pleural effusion   . Morbid obesity due to excess calories ([place]) 03/17/2016  . Chronic anemia 02/28/2016  . Fatigue associated with anemia 01/21/2016  . Diabetic retinopathy ([place]) 11/13/2015  . CHF (congestive heart failure) ([place]) 08/27/2015  . Iron deficiency anemia 08/03/2015  . Claudication  ([place])   . Diabetes ([place])   . Hematuria 06/27/2015  . CKD (chronic kidney disease) stage 3, GFR 30-59 ml/min (HCC)   . Skin lesion of face 02/22/2015  . 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 Deficiency anemia 01/15/2015  . CKD (chronic kidney disease) stage 4, GFR 15-29 ml/min (HCC) 01/15/2015  . Hypothyroidism 11/22/2014  . Acute on chronic diastolic congestive heart failure ([place]) 07/04/2014  . COPD GOLD III  02/16/2014  . Acute respiratory failure with hypoxia ([place])   . SOB (shortness of breath)   . ARDS (adult respiratory distress syndrome) ([place]) 02/10/2014  . History of tobacco use 08/23/2013  . 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 CAD (coronary artery disease) 04/06/2013  . PVD - hx of Rt SFA PTA and s/p Rt 4-5th toe amp Dec 2014 04/05/2013  . Essential hypertension 11/25/2012  . Mixed hyperlipidemia 07/17/2008    Past Medical History: Past Medical History:  Diagnosis Date  . Cancer ([place])    skin cancer-  . CHF (congestive heart failure) ([place])   . CKD (chronic kidney disease), stage IV ([place])    07/07/2017  . Coronary artery disease   . Gangrene of toe (  [place])    right 5th/notes 01/04/2013   . High cholesterol   . Hypertension   . Iron deficiency anemia 08/03/2015  . 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 PAD (peripheral artery disease) ([place])   . Shortness of breath dyspnea    with exertion  . Type II diabetes mellitus ([place])    Type II    Past Surgical History: Past Surgical History:  Procedure Laterality Date  . ABDOMINAL AORTOGRAM W/LOWER EXTREMITY N/A 03/12/2017   Procedure: ABDOMINAL AORTOGRAM W/LOWER EXTREMITY;  Surgeon: Conrad Alden, MD;  Location: [facility];  Service: Cardiovascular;  Laterality: N/A;  . AMPUTATION Right 01/28/2013   Procedure: RAY AMPUTATION RIGHT  4th & 5th TOE;  Surgeon: Rosetta Posner, MD;  Location: [facility];  Service: Vascular;  Laterality: Right;  . AMPUTATION Left 03/15/2017   Procedure:  AMPUTATION ABOVE KNEE;  Surgeon: Waynetta Sandy, MD;  Location: [facility];  Service: Vascular;  Laterality: Left;  . ANGIOPLASTY / STENTING FEMORAL Right 01/13/2013   superficial femoral artery x 2 (3 mm x 60 mm, 4 mm x 60 mm)  . AV FISTULA PLACEMENT Left 02/11/2016   Procedure: LEFT UPPER ARM BRACHIAL CEPHALIC ARTERIOVENOUS (AV) FISTULA CREATION;  Surgeon: Conrad Middlesborough, MD;  Location: [facility];  Service: Vascular;  Laterality: Left;  . COLONOSCOPY W/ POLYPECTOMY    . CORONARY ARTERY BYPASS GRAFT N/A 04/07/2013   Procedure: CORONARY ARTERY BYPASS GRAFTING (CABG);  Surgeon: Ivin Poot, MD;  Location: [facility];  Service: Open Heart Surgery;  Laterality: N/A;  . INTRAOPERATIVE TRANSESOPHAGEAL ECHOCARDIOGRAM N/A 04/07/2013   Procedure: INTRAOPERATIVE TRANSESOPHAGEAL ECHOCARDIOGRAM;  Surgeon: Ivin Poot, MD;  Location: [facility];  Service: Open Heart Surgery;  Laterality: N/A;  . LEFT HEART CATHETERIZATION WITH CORONARY ANGIOGRAM N/A 04/05/2013   Procedure: LEFT HEART CATHETERIZATION WITH CORONARY ANGIOGRAM;  Surgeon: Peter M Martinique, MD;  Location: [facility] CATH LAB;  Service: Cardiovascular;  Laterality: N/A;  . LOWER EXTREMITY ANGIOGRAM Bilateral 01/06/2013   Procedure: LOWER EXTREMITY ANGIOGRAM;  Surgeon: Conrad San German, MD;  Location: [facility] CATH LAB;  Service: Cardiovascular;  Laterality: Bilateral;  . LOWER EXTREMITY ANGIOGRAM Right 01/13/2013   Procedure: LOWER EXTREMITY ANGIOGRAM;  Surgeon: Conrad Fort Totten, MD;  Location[facility] C[facility] LAB;  Service: Cardiovascular;  Laterality: Right;  . SKIN CANCER EXCISION  2017  . THROAT SURGERY  1983   polyps  . TONSILLECTOMY AND ADENOIDECTOMY  ~ 1965    Social History: Social History   Tobacco Use  . Smoking status: Former Smoker    Packs/day: 1.00    Years: 46.00    Pack years: 46.00    Types: Cigarettes    Start date: 02/04/1967    Last attempt to quit: 02/03/2013    Years since quitting: 4.5  . Smokeless tobacco: Never Used  Substance Use Topics  . Alcohol use:  No    Alcohol/week: 0.0 oz  . Drug use: No    Please also refer to relevant sections of EMR.  Family History: Family History  Problem Relation Age of Onset  . Cancer Mother        lung cancer  . Hypertension Mother   . Cancer Sister        patient thinks it was uterine cancer  . Hypertension Sister   . Heart attack Sister   . Stroke Neg Hx      Allergies and Medications: Allergies  Allergen Reactions  . Penicillins Other (See Comments)  Has patient had a PCN reaction causing immediate rash, facial/tongue/throat swelling, SOB or lightheadedness with hypotension: Unknown Has patient had a PCN reaction causing severe rash involving mucus membranes or skin necrosis: Unknown Has patient had a PCN reaction that required hospitalization: Unknown Has patient had a PCN reaction occurring within the last 10 years: Unknown If all of the above answers are "NO", then may proceed with Cephalosporin use.    Current Facility-Administered Medications on File Prior to Encounter  Medication Dose Route Frequency Provider Last Rate Last Dose  . sodium chloride flush (NS) 0.9 % injection 10 mL  10 mL Intracatheter PRN Heath Lark, MD       Current Outpatient Medications on File Prior to Encounter  Medication Sig Dispense Refill  . insulin glargine (LANTUS) 100 UNIT/ML injection Inject 24 Units into the skin at bedtime.    Marland Kitchen acetaminophen (TYLENOL) 325 MG tablet Take 650 mg by mouth every 6 (six) hours as needed for mild pain or fever.    Marland Kitchen allopurinol (ZYLOPRIM) 100 MG tablet Take 100 mg by mouth daily.    Marland Kitchen amLODipine (NORVASC) 10 MG tablet Take 1 tablet (10 mg total) by mouth daily. Needs office visit for further refills 30 tablet 0  . aspirin EC 81 MG tablet Take 1 tablet (81 mg total) by mouth daily. (Patient not taking: Reported on 08/13/2017) 150 tablet 0  . atorvastatin (LIPITOR) 40 MG tablet TAKE ONE (1) TABLET BY MOUTH EVERY DAY AT 6:00PM (Patient taking differently: Take 40 mg by  mouth once a day at 6 PM) 90 tablet 3  . Blood Glucose Monitoring Suppl (ONETOUCH VERIO FLEX SYSTEM) w/Device KIT 1 kit by Does not apply route daily. 1 kit 3  . carvedilol (COREG) 25 MG tablet TAKE TWO (2) TABLETS BY MOUTH 2 TIMES DAILY WITH A MEAL (Patient taking differently: TAKE TWO TABLETS (65m) BY MOUTH 2 TIMES DAILY WITH A MEAL) 120 tablet 3  . cloNIDine (CATAPRES - DOSED IN MG/24 HR) 0.2 mg/24hr patch Place 1 patch (0.2 mg total) onto the skin once a week. (Patient taking differently: Place 0.2 mg onto the skin See admin instructions. Apply one patch transdermal one time a day every monday) 4 patch 0  . doxazosin (CARDURA) 8 MG tablet Take 1 tablet (8 mg total) by mouth every evening.    .Marland KitchenENSURE (ENSURE) Take 237 mLs by mouth 3 (three) times daily between meals.    . feeding supplement, ENSURE ENLIVE, (ENSURE ENLIVE) LIQD Take 237 mLs by mouth 3 (three) times daily between meals. (Patient not taking: Reported on 08/13/2017) 237 mL 12  . FEROSUL 325 (65 Fe) MG tablet TAKE ONE (1) TABLET BY MOUTH TWO (2) TIMES DAILY WITH A MEAL (Patient taking differently: TAKE 325 MG BY MOUTH TWO (2) TIMES DAILY WITH A MEAL) 60 tablet 3  . glucose blood (ONETOUCH VERIO) test strip Use as instructed to test three times daily. ICD-10 code: E11.22 100 each 12  . hydrALAZINE (APRESOLINE) 100 MG tablet TAKE ONE TABLET BY MOUTH EVERY EIGHT HOURS 90 tablet 1  . insulin aspart (NOVOLOG FLEXPEN) 100 UNIT/ML FlexPen Inject 15 Units 2 (two) times daily before lunch and supper into the skin. (Patient taking differently: Inject 0-15 Units into the skin See admin instructions. Inject as per sliding scale: 0-150= 0 units 151-400=15 units Call PCP if CBG<70 or >401) 15 mL 5  . insulin detemir (LEVEMIR) 100 UNIT/ML injection Inject 50 Units into the skin at bedtime.    .Marland Kitchenipratropium-albuterol (  DUONEB) 0.5-2.5 (3) MG/3ML SOLN Take 3 mLs by nebulization 4 (four) times daily as needed (shortness of breath and wheezing).     .  isosorbide mononitrate (IMDUR) 60 MG 24 hr tablet TAKE ONE (1) TABLET BY MOUTH EVERY DAY (Patient taking differently: TAKE ONE TABLET (70m) BY MOUTH EVERY at bedtime) 30 tablet 3  . levothyroxine (SYNTHROID, LEVOTHROID) 75 MCG tablet Take 75 mcg by mouth daily before breakfast.     . minoxidil (LONITEN) 10 MG tablet Take 0.5 tablets (5 mg total) by mouth daily. (Patient taking differently: Take 10 mg by mouth daily. ) 15 tablet 0  . nitroGLYCERIN (NITROSTAT) 0.4 MG SL tablet Place 1 tablet (0.4 mg total) under the tongue every 5 (five) minutes as needed for chest pain. 30 tablet 12  . ONETOUCH DELICA LANCETS 302RMISC 1 each by Does not apply route 3 (three) times daily. ICD-10 code: E11.22 100 each 12  . pantoprazole (PROTONIX) 40 MG tablet TAKE ONE (1) TABLET BY MOUTH EVERY DAY (Patient taking differently: TAKE ONE TABLET (454m BY MOUTH EVERY DAY) 90 tablet 0  . polyethylene glycol (MIRALAX / GLYCOLAX) packet Take 17 g by mouth daily as needed for mild constipation or moderate constipation. (Patient taking differently: Take 17 g by mouth every 8 (eight) hours as needed for mild constipation. ) 14 each 0  . torsemide (DEMADEX) 20 MG tablet Take 1 tablet (20 mg total) by mouth daily. 30 tablet 0    Objective: BP (!) 117/57   Pulse 69   Temp (!) 96.9 F (36.1 C) (Rectal)   Resp 20   Ht '5\' 8"'  (1.727 m)   Wt 175 lb (79.4 kg)   SpO2 97%   BMI 26.61 kg/m   Physical Exam  Constitutional: He is well-developed, well-nourished, and in no distress. No distress.  HENT:  Head: Normocephalic and atraumatic.  Cardiovascular: Normal rate and regular rhythm. Exam reveals no gallop and no friction rub.  Murmur heard. Pulmonary/Chest: Effort normal. No respiratory distress. He has no wheezes. He has rales (right base to mid lung, left base).  Abdominal: Soft. Bowel sounds are normal. He exhibits no distension. There is no tenderness.  Musculoskeletal: He exhibits edema (1+ pitting edema RLE).  Left AKA   Neurological:  Somnolent, but arousable, non-cooperative with most of exam  Skin: Skin is warm and dry. He is not diaphoretic.    Labs and Imaging: CBC BMET  Recent Labs  Lab 08/25/17 1002  WBC 7.7  HGB 8.1*  HCT 26.6*  PLT 184   Recent Labs  Lab 08/25/17 1002  NA 140  K 4.7  CL 109  CO2 21*  BUN 38*  CREATININE 2.91*  GLUCOSE 150*  CALCIUM 8.6*     Dg Chest Port 1 View  Result Date: 08/25/2017 CLINICAL DATA:  Shortness of breath for 1 week. Oxygen desaturation. EXAM: PORTABLE CHEST 1 VIEW COMPARISON:  PA and lateral chest 08/13/2017 and 08/04/2017. FINDINGS: Right greater than left pleural effusions and airspace disease have markedly worsened since the most recent examination. There is cardiomegaly. No pneumothorax. No acute or bony abnormality. IMPRESSION: Marked worsening of right greater than left pleural effusions and airspace disease which could be due to pneumonia and/or pulmonary edema. Electronically Signed   By: ThInge Rise.D.   On: 08/25/2017 10:31    Rittberger, BaBernita RaisinDO 08/25/2017, 1:55 PM PGY-1, [facility] pager: [phone] pages welcome  FPStillman Valleypper-Level Resident Addendum   I have  independently interviewed and examined the patient. I have discussed the above with the original author and agree with their documentation. My edits for correction/addition/clarification are in blue. Please see also any attending notes.    Sherene Sires, DO PGY-2, [facility] 08/25/2017 3:50 PM  FPTS Service pager: [phone] (text pages welcome through [facility])

## 2017-08-25 NOTE — ED Notes (Signed)
Multiple warm blankets applied.

## 2017-08-25 NOTE — Care Management Note (Signed)
Case Management Note  Patient Details  Name: LEODAN BOLYARD MRN: [id number] Date of Birth: July 13, 1950  Subjective/Objective:                  CHF, COPD  Action/Plan: Patient resides in a SNF / [facility]; SW following to transition patient back to facility when medically stable; CM will continue to follow for progression of care.  Expected Discharge Date:   possibly 08/30/2017             Expected Discharge Plan:  [place]  Discharge planning Services  CM Consult  Status of Service:  In process, will continue to follow  Sherrilyn Rist [phone] 08/25/2017, 3:21 PM

## 2017-08-25 NOTE — ED Notes (Signed)
Warm blankets applied to pt.  

## 2017-08-25 NOTE — ED Triage Notes (Addendum)
Pt here from [facility] c/o shortness of breath x1 week. Per EMS, staff at facility stated "it's like he can't get oxygen in his lungs." 86% on 4L O2. 95-99% NRB 15L. RR 26-30 with accessory muscle use. Denies chest pain, nausea.

## 2017-08-26 ENCOUNTER — Inpatient Hospital Stay (HOSPITAL_COMMUNITY): Payer: Medicare Other

## 2017-08-26 DIAGNOSIS — J9601 Acute respiratory failure with hypoxia: Secondary | ICD-10-CM | POA: Diagnosis not present

## 2017-08-26 DIAGNOSIS — Z794 Long term (current) use of insulin: Secondary | ICD-10-CM

## 2017-08-26 DIAGNOSIS — N185 Chronic kidney disease, stage 5: Secondary | ICD-10-CM | POA: Diagnosis not present

## 2017-08-26 DIAGNOSIS — J440 Chronic obstructive pulmonary disease with acute lower respiratory infection: Secondary | ICD-10-CM | POA: Diagnosis not present

## 2017-08-26 DIAGNOSIS — N179 Acute kidney failure, unspecified: Secondary | ICD-10-CM | POA: Diagnosis not present

## 2017-08-26 DIAGNOSIS — I5033 Acute on chronic diastolic (congestive) heart failure: Secondary | ICD-10-CM | POA: Diagnosis not present

## 2017-08-26 DIAGNOSIS — E118 Type 2 diabetes mellitus with unspecified complications: Secondary | ICD-10-CM | POA: Diagnosis not present

## 2017-08-26 DIAGNOSIS — R0602 Shortness of breath: Secondary | ICD-10-CM | POA: Diagnosis not present

## 2017-08-26 DIAGNOSIS — J90 Pleural effusion, not elsewhere classified: Secondary | ICD-10-CM | POA: Diagnosis not present

## 2017-08-26 DIAGNOSIS — J181 Lobar pneumonia, unspecified organism: Secondary | ICD-10-CM | POA: Diagnosis not present

## 2017-08-26 DIAGNOSIS — I132 Hypertensive heart and chronic kidney disease with heart failure and with stage 5 chronic kidney disease, or end stage renal disease: Secondary | ICD-10-CM | POA: Diagnosis not present

## 2017-08-26 DIAGNOSIS — J449 Chronic obstructive pulmonary disease, unspecified: Secondary | ICD-10-CM | POA: Diagnosis not present

## 2017-08-26 DIAGNOSIS — N184 Chronic kidney disease, stage 4 (severe): Secondary | ICD-10-CM | POA: Diagnosis not present

## 2017-08-26 DIAGNOSIS — J9611 Chronic respiratory failure with hypoxia: Secondary | ICD-10-CM | POA: Diagnosis not present

## 2017-08-26 LAB — GLUCOSE, CAPILLARY
GLUCOSE-CAPILLARY: 126 mg/dL — AB (ref 70–99)
GLUCOSE-CAPILLARY: 147 mg/dL — AB (ref 70–99)
GLUCOSE-CAPILLARY: 133 mg/dL — AB (ref 70–99)
Glucose-Capillary: 213 mg/dL — ABNORMAL HIGH (ref 70–99)

## 2017-08-26 LAB — CBC
HCT: 23.5 % — ABNORMAL LOW (ref 39.0–52.0)
HEMOGLOBIN: 7.2 g/dL — AB (ref 13.0–17.0)
MCH: 27.2 pg (ref 26.0–34.0)
MCHC: 30.6 g/dL (ref 30.0–36.0)
MCV: 88.7 fL (ref 78.0–100.0)
Platelets: 160 10*3/uL (ref 150–400)
RBC: 2.65 MIL/uL — ABNORMAL LOW (ref 4.22–5.81)
RDW: 16.9 % — AB (ref 11.5–15.5)
WBC: 3.7 10*3/uL — ABNORMAL LOW (ref 4.0–10.5)

## 2017-08-26 LAB — BLOOD CULTURE ID PANEL (REFLEXED)
Acinetobacter baumannii: NOT DETECTED
CANDIDA ALBICANS: NOT DETECTED
CANDIDA GLABRATA: NOT DETECTED
CANDIDA PARAPSILOSIS: NOT DETECTED
CANDIDA TROPICALIS: NOT DETECTED
Candida krusei: NOT DETECTED
ENTEROBACTER CLOACAE COMPLEX: NOT DETECTED
ENTEROBACTERIACEAE SPECIES: NOT DETECTED
Enterococcus species: NOT DETECTED
Escherichia coli: NOT DETECTED
HAEMOPHILUS INFLUENZAE: NOT DETECTED
KLEBSIELLA OXYTOCA: NOT DETECTED
KLEBSIELLA PNEUMONIAE: NOT DETECTED
Listeria monocytogenes: NOT DETECTED
Methicillin resistance: DETECTED — AB
Neisseria meningitidis: NOT DETECTED
PROTEUS SPECIES: NOT DETECTED
Pseudomonas aeruginosa: NOT DETECTED
STAPHYLOCOCCUS SPECIES: DETECTED — AB
STREPTOCOCCUS PYOGENES: NOT DETECTED
Serratia marcescens: NOT DETECTED
Staphylococcus aureus (BCID): NOT DETECTED
Streptococcus agalactiae: NOT DETECTED
Streptococcus pneumoniae: NOT DETECTED
Streptococcus species: NOT DETECTED

## 2017-08-26 LAB — BASIC METABOLIC PANEL
ANION GAP: 8 (ref 5–15)
BUN: 41 mg/dL — ABNORMAL HIGH (ref 8–23)
CALCIUM: 8.4 mg/dL — AB (ref 8.9–10.3)
CO2: 22 mmol/L (ref 22–32)
CREATININE: 3 mg/dL — AB (ref 0.61–1.24)
Chloride: 110 mmol/L (ref 98–111)
GFR calc Af Amer: 23 mL/min — ABNORMAL LOW (ref >60)
GFR, EST NON AFRICAN AMERICAN: 20 mL/min — AB (ref >60)
GLUCOSE: 132 mg/dL — AB (ref 70–99)
Potassium: 4.5 mmol/L (ref 3.5–5.1)
Sodium: 140 mmol/L (ref 135–145)

## 2017-08-26 MED ORDER — ORAL CARE MOUTH RINSE
15.0000 mL | Freq: Two times a day (BID) | OROMUCOSAL | Status: DC
  Administered 2017-08-27 – 2017-08-30 (×6): 15 mL via OROMUCOSAL

## 2017-08-26 MED ORDER — CARVEDILOL 12.5 MG PO TABS
12.5000 mg | ORAL_TABLET | Freq: Two times a day (BID) | ORAL | Status: DC
  Administered 2017-08-26 – 2017-08-30 (×8): 12.5 mg via ORAL
  Filled 2017-08-26 (×8): qty 1

## 2017-08-26 MED ORDER — CARVEDILOL 25 MG PO TABS
25.0000 mg | ORAL_TABLET | Freq: Two times a day (BID) | ORAL | Status: DC
  Administered 2017-08-26: 25 mg via ORAL
  Filled 2017-08-26: qty 1

## 2017-08-26 MED ORDER — DEXTROSE 5 % IV SOLN
120.0000 mg | Freq: Once | INTRAVENOUS | Status: AC
  Administered 2017-08-26: 120 mg via INTRAVENOUS
  Filled 2017-08-26: qty 10

## 2017-08-26 NOTE — Progress Notes (Signed)
Family Medicine Teaching Service Daily Progress Note Intern Pager: [phone]  [name] Medical record number: [id number] Date of birth: October 03, 1950 Age: 67 y.o. Gender: male  Primary Care Provider: Lind Covert, MD Consultants: none Code Status: Full  Pt Overview and Major Events to Date:  7/23 admitted for CHF  Assessment and Plan: Leonard [name] is a 67 y.o. male presenting with reported O2 desaturation per SNF. PMH is significant for COPD, CKD Stage 4, CAD s/p CABG, T2DM, HTN, Hypothyroidism, and HLD.  CHF Exacerbation: Acute on Chronic w/ poss MRSA bacteremia Mild tachypnea overnight 22(7/24), now on 2.5 L [place] . UOP 550 mL (7/24), K 4.5 (7/24). CXR shows significant pulmonary congestion and effusions due to edema and/or pneumonia. BCx with MR coag neg staph (likely contaminant). - continue O2 therapy, goal O2 sats 88-92%, baseline 2L - [facility] ID re BCx - no concerns, repeat culture - hold diuretics - consult Heart failure appreciate recs  AKI on Chronic Kidney Disease, Stage 4: BL ~1.6-1.9, Cr: 3.0 (7/24) - holding loop diuretics  COPD - Stable, no acute issue - cont duoneb q6h prn  Anemia: Chronic, Stable Hgb 8.1 on admission, was 7.5 on discharge on 7/12.  Hemoglobin 7.2 on 7/24.  Likely anemia of chronic disease due to CKD and normal MCV. - Monitor CBC  Hypertension: Chronic On Torsemide, amlodipine, carvedilol, Imdur, clonidine, doxazosin, hydralazine at home. systoics between 120-130 overnight.  -hold torsemide and amlodipine - decrease coreg to 12.5 - cont home imdur - cont home clonidine - hold doxazosin - hold hydralazine - continue to monitor BP  T2DM: Controlled BG well controlled overnight.  105-132. -sSSI -monitor CBG AC/HS  HLD: Chronic On Lipitor 40mg . - Cont home lipitor  Hypothyroidism: Stable Last TSH 3.338 on 07/10/2017.  On Synthroid 69mcg at home. -Cont home synthroid  Hx DVT, PAD On Eliquis in the  past, discontinued at recent hospitalization.  On ASA 81 QD. - Cont home ASA  Hx Left Leg AKA: Stable - no intervention needed - PT/OT while inpatient  Gout: Chronic, no acute flare On allopurinol at home - hold allopurinol due to Cr increase  BPH On doxazosin at home. -hold doxazosin due to low-normal BP  FEN/GI: Heart Healthy Prophylaxis: Lovenox  Disposition: MedSurg for further diruesis  Subjective:  Mr. Borcherding was seen this morning and reports feeling well.  According to patient he was having trouble breathing yesterday but has since improved.  He has no new complaints this morning and feels well.  Objective: Temp:  [96.9 F (36.1 C)-99.1 F (37.3 C)] 98.1 F (36.7 C) (07/24 0439) Pulse Rate:  [67-81] 67 (07/24 0439) Resp:  [18-24] 20 (07/24 0439) BP: (111-155)/(52-97) 126/54 (07/24 0439) SpO2:  [79 %-100 %] 94 % (07/24 0439) Weight:  [175 lb (79.4 kg)-181 lb 7 oz (82.3 kg)] 181 lb 3.5 oz (82.2 kg) (07/24 0439) Physical Exam: General: Alert and cooperative and appears to be in no acute distress Cardio: Distant heart sounds.  Regular rate and rhythm.   Pulm: Crack crackles in middle and lower lobes bilaterally, patient on 2.5 L [place], normal respiratory effort  abdomen: Bowel sounds normal. Abdomen soft and non-tender.  Extremities: No peripheral edema. Warm/ well perfused.  Strong radial and pedal pulses. Neuro: Cranial nerves grossly intact  Laboratory: Recent Labs  Lab 08/25/17 1002 08/26/17 0417  WBC 7.7 3.7*  HGB 8.1* 7.2*  HCT 26.6* 23.5*  PLT 184 160   Recent Labs  Lab 08/25/17 1002 08/26/17 0417  NA 140 140  K 4.7 4.5  CL 109 110  CO2 21* 22  BUN 38* 41*  CREATININE 2.91* 3.00*  CALCIUM 8.6* 8.4*  GLUCOSE 150* 132*      Imaging/Diagnostic Tests: Dg Chest Port 1 View  Result Date: 08/25/2017 CLINICAL DATA:  Shortness of breath for 1 week. Oxygen desaturation. EXAM: PORTABLE CHEST 1 VIEW COMPARISON:  PA and lateral chest 08/13/2017 and  08/04/2017. FINDINGS: Right greater than left pleural effusions and airspace disease have markedly worsened since the most recent examination. There is cardiomegaly. No pneumothorax. No acute or bony abnormality. IMPRESSION: Marked worsening of right greater than left pleural effusions and airspace disease which could be due to pneumonia and/or pulmonary edema. Electronically Signed   By: Inge Rise M.D.   On: 08/25/2017 10:31     Matilde Haymaker, MD 08/26/2017, 5:55 AM PGY-1, [place] Intern pager: [phone], text pages welcome

## 2017-08-26 NOTE — Clinical Social Work Note (Signed)
Clinical Social Work Assessment  Patient Details  Name: Leonard [name] MRN: [id number] Date of Birth: 01-14-1951  Date of referral:  08/26/17               Reason for consult:  Discharge Planning                Permission sought to share information with:  Facility Art therapist granted to share information::  Yes, Verbal Permission Granted  Name::        Agency::  [facility]  Relationship::     Contact Information:     Housing/Transportation Living arrangements for the past 2 months:  [place] of Information:  Patient, Medical Team, Facility Patient Interpreter Needed:  None Criminal Activity/Legal Involvement Pertinent to Current Situation/Hospitalization:  No - Comment as needed Significant Relationships:  Siblings, Other Family Members, Neighbor Lives with:  Facility Resident Do you feel safe going back to the place where you live?  Yes Need for family participation in patient care:  Yes (Comment)  Care giving concerns:  Patient is a long-term resident at [facility].   Social Worker assessment / plan:  CSW met with patient. No supports at bedside. CSW introduced role and explained that discharge planning would be discussed. Patient confirmed he is a resident at [facility] and plans to return at discharge. No further concerns. CSW encouraged patient to contact CSW as needed. CSW will continue to follow patient for support and facilitate discharge back to SNF once medically stable.  Employment status:  Retired Forensic scientist:  Information systems manager, Medicaid In [place] PT Recommendations:  Not assessed at this time [place] / Referral to community resources:  [place]  Patient/Family's Response to care:  Patient agreeable to return to SNF. Patient's family supportive and involved in patient's care. Patient appreciated social work intervention.  Patient/Family's Understanding of and Emotional Response to Diagnosis,  Current Treatment, and Prognosis:  Patient has a good understanding of the reason for admission and plan to return to SNF at discharge. Patient appears pleased with hospital care.  Emotional Assessment Appearance:  Appears stated age Attitude/Demeanor/Rapport:  Engaged, Gracious Affect (typically observed):  Accepting, Appropriate, Calm, Pleasant Orientation:  Oriented to Self, Oriented to Place, Oriented to  Time, Oriented to Situation Alcohol / Substance use:  Tobacco Use(History) Psych involvement (Current and /or in the community):  No (Comment)  Discharge Needs  Concerns to be addressed:  Care Coordination Readmission within the last 30 days:  Yes Current discharge risk:  None Barriers to Discharge:  Continued Medical Work up   Candie Chroman, LCSW 08/26/2017, 1:05 PM

## 2017-08-26 NOTE — Progress Notes (Signed)
PHARMACY - PHYSICIAN COMMUNICATION CRITICAL VALUE ALERT - BLOOD CULTURE IDENTIFICATION (BCID)  Leonard [name] is an 67 y.o. male who presented to [facility] on 08/25/2017 with a chief complaint of shortness of breath. He was recently treated for HCAP and discharged on 7/12. Now receiving treatment for AoC CHF exacerbation. BCx now positive for CoNS - both bottles positive, but only one set drawn.  Name of physician (or Provider) Contacted: Pilar Plate  Current antibiotics: cefepime x1 in the ED, no current abx  Changes to prescribed antibiotics recommended: None. Likely contaminant, continue to monitor off antibiotics. Recommendations accepted by provider.  Results for orders placed or performed during the hospital encounter of 08/25/17  Blood Culture ID Panel (Reflexed) (Collected: 08/25/2017 10:55 AM)  Result Value Ref Range   Enterococcus species NOT DETECTED NOT DETECTED   Listeria monocytogenes NOT DETECTED NOT DETECTED   Staphylococcus species DETECTED (A) NOT DETECTED   Staphylococcus aureus NOT DETECTED NOT DETECTED   Methicillin resistance DETECTED (A) NOT DETECTED   Streptococcus species NOT DETECTED NOT DETECTED   Streptococcus agalactiae NOT DETECTED NOT DETECTED   Streptococcus pneumoniae NOT DETECTED NOT DETECTED   Streptococcus pyogenes NOT DETECTED NOT DETECTED   Acinetobacter baumannii NOT DETECTED NOT DETECTED   Enterobacteriaceae species NOT DETECTED NOT DETECTED   Enterobacter cloacae complex NOT DETECTED NOT DETECTED   Escherichia coli NOT DETECTED NOT DETECTED   Klebsiella oxytoca NOT DETECTED NOT DETECTED   Klebsiella pneumoniae NOT DETECTED NOT DETECTED   Proteus species NOT DETECTED NOT DETECTED   Serratia marcescens NOT DETECTED NOT DETECTED   Haemophilus influenzae NOT DETECTED NOT DETECTED   Neisseria meningitidis NOT DETECTED NOT DETECTED   Pseudomonas aeruginosa NOT DETECTED NOT DETECTED   Candida albicans NOT DETECTED NOT DETECTED   Candida glabrata NOT  DETECTED NOT DETECTED   Candida krusei NOT DETECTED NOT DETECTED   Candida parapsilosis NOT DETECTED NOT DETECTED   Candida tropicalis NOT DETECTED NOT DETECTED   Erin N. Gerarda Fraction, PharmD PGY2 Infectious Diseases Pharmacy Resident Phone: [phone] 08/26/2017  10:49 AM

## 2017-08-26 NOTE — Progress Notes (Signed)
Pt is pretty much bed bound, vitals stable, denies pain, pt is alert and oriented but he talks to himself while dozing off, one dose IV lasix piggy bag given, will continue to monitor the patient  Palma Holter, RN

## 2017-08-26 NOTE — Plan of Care (Signed)
  Problem: Education: Goal: Knowledge of General Education information will improve Description: Including pain rating scale, medication(s)/side effects and non-pharmacologic comfort measures Outcome: Progressing   Problem: Health Behavior/Discharge Planning: Goal: Ability to manage health-related needs will improve Outcome: Progressing   Problem: Clinical Measurements: Goal: Respiratory complications will improve Outcome: Progressing   

## 2017-08-26 NOTE — Discharge Summary (Addendum)
[facility] Discharge Summary  [name] Medical record number: [id number] Date of birth: Nov 19, 1950 Age: 67 y.o. Gender: male Date of Admission: 08/25/2017  Date of Discharge: 08/30/2017 Admitting Physician: Blane Ohara McDiarmid, MD  Primary Care Provider: Kathrene Alu, MD Consultants: Heart failure, Pulmonology  Indication for Hospitalization: CHF exacerbation  Discharge Diagnoses/Problem List:  CHF exacerbation AKI on CKD, Stage 4 Hypokalemia COPD Anemia Hypertension T2DM HLD Hypothyroidism Hx DVT, PAD Hx Left Leg AKA Gout BPH  Disposition: back to [facility]  Discharge Condition: Stable  Discharge Exam:   General: 67 y.o. y.o. male in NAD, on 2L Cardio: RRR no m/r/g Lungs: Minimal crackles right lung base, no wheezing, no IWB Abdomen: Soft, non-tender to palpation, positive bowel sounds Skin: warm and dry Extremities: No edema RLE    Brief Hospital Course:  Ms. Leonard [name] is a 67 year old man presented with report of O2 desaturation and shortness of breath for 1 week.  His medical history is significant for Diastolic heart failure, CAD status post CABG, HLD, COPD, CKD stage IV, T2 DM, hypertension, hypothyroidism.  His chest x-ray on admission was remarkable for significant bilateral pleural effusions with the possibility of pneumonia.  He was admitted for acute decompensated heart failure and we began to diuresis.  On 7/24 his creatinine is significantly increasing with poor UOP and minimal improvement on physical exam.  At that time heart failure was consulted.  He was continued on Lasix 120 mg IV twice daily.  He was also given metolazone twice and continued to diurese well.  He returned to his home requirement of 2L O2 prior to discharge.  His creatinine had also returned it baseline at the time of discharge.  He was transitioned to oral torsemide and his home dose was increased.  He should continue this medication as  an outpatient and follow up with his PCP for management.  At the time of discharge, the patient was stable and breathing was at baseline.  Pulmonology was also consulted in regards to the patient's pleural effusion.  They did not recommend treatment for pneumonia as the patient did not show clinical signs of pneumonia or infection, his procalcitonin and WBC count were within normal limits.  The pulmonologist believes that his shortness of breath is somewhat related to positioning in bed, as he mostly lays on his right side.  They recommended continued diuresis, which was completed by the heart failure team. A follow up chest x-ray is recommended if the patient begins to experience shortness of breath or desaturation.  It is also recommended that the patient follow up with pulmonology as an outpatient as his right pleural effusion may require a tap at some point.  As this patient is a COPD patient, it is recommended that his O2 be titrated to allow his O2 saturation to stay between 88-92%.  Saturations at a higher level could cause his respiratory drive to decrease.  His hypertension was also addressed during his hospitalization.  He had a lower BP on admission and his home meds were held.  As his clinical status improved, his blood pressure became hypertensive.  Hydralazine was gradually added back and the patient was discharged on his home dose or hydralazine 179m TID.  His doxazosin was also restarted for BPH.  Patient also has chronic anemia.  During his hospitalization he was given one dose of IV Feraheme and was started on Aranesp.  He will need follow up CBC and further management as outpatient.  He was hemodynamically stable at the time of discharge.     Issues for Follow Up:  1. Follow up with pulmonology for possible tap of right pleural effusion 2. Monitor diuresis, patient on Torsemide 54m QD 3. Monitor Cr, recheck BMP in 1 week.  Consider Nephro consult if Cr worsening. 4. Monitor  K on  repeat BMP, patient had a mild hypokalemia that was repleated prior to discharge. 5. Monitor CBC as patient has chronic anemia, was started on Aranesp. 6. Will need follow up of BP, as was elevated during his hospitalization and patient's HTN meds were started again. Can add back minoxidil as needed. Holding amlodipine. 7. He will eventually need Spironolactone for CHF, but held off due to AKI. 8. Allopurinol was held for AKI.  Consider adding back.  No acute flare at the time of discharge.  Significant Procedures: IV Feraheme x1  Significant Labs and Imaging:  Recent Labs  Lab 08/28/17 0540 08/29/17 0418 08/30/17 0840  WBC 5.4 6.4 6.2  HGB 7.4* 7.3* 7.9*  HCT 24.2* 24.1* 26.2*  PLT 183 182 199   Recent Labs  Lab 08/26/17 0417 08/27/17 0556 08/28/17 0540 08/29/17 0418 08/30/17 0840  NA 140 140 140 136 141  K 4.5 4.3 4.2 4.1 3.3*  CL 110 107 104 99 100  CO2 _0 GLUCOSE 132* 165* 124* 186* 171*  BUN 41* 42* 42* 43* 44*  CREATININE 3.00* 2.85* 2.49* 2.02* 1.76*  CALCIUM 8.4* 8.4* 8.5* 8.5* 8.6*    Ct Chest Wo Contrast  Result Date: 08/26/2017 CLINICAL DATA:  Pleural effusion and shortness of breath. Possible pneumonia. EXAM: CT CHEST WITHOUT CONTRAST TECHNIQUE: Multidetector CT imaging of the chest was performed following the standard protocol without IV contrast. COMPARISON:  Chest radiograph 08/25/2017.  CT chest 07/11/2017 FINDINGS: Cardiovascular: Diffuse cardiac enlargement. Coronary artery calcifications with postoperative changes consistent with coronary bypass. No pericardial effusion. Normal caliber thoracic aorta with calcification. Mediastinum/Nodes: Moderately prominent lymph nodes throughout the mediastinum and axillary regions are probably reactive. Esophagus is decompressed. Surgical clips. Lungs/Pleura: Large right and small to moderate left pleural effusions. Atelectasis in the lung bases. Focal consolidation with air bronchograms suggested in the right  middle lung. This may reflect pneumonia. Hazy airspace and interstitial opacities likely to represent edema. No pneumothorax. Appearances are similar to previous study with interval development of the right middle lobe process. Airways are patent. Upper Abdomen: Limited visualization. No acute abnormality suggested. Musculoskeletal: Postoperative median sternotomy with sternotomy wires present. IMPRESSION: 1. Large right and small to moderate left pleural effusions. Atelectasis in the lung bases. Focal consolidation with air bronchograms in the right middle lobe likely representing pneumonia. Appearances are similar to previous study except for interval development of the right middle lobe process. 2. Diffuse airspace and interstitial infiltration in the remaining lungs likely indicates edema. 3. Cardiac enlargement. Aortic atherosclerosis. Postoperative coronary bypass. Aortic Atherosclerosis (ICD10-I70.0). Electronically Signed   By: WLucienne CapersM.D.   On: 08/26/2017 22:04   Dg Chest Port 1 View  Result Date: 08/25/2017 CLINICAL DATA:  Shortness of breath for 1 week. Oxygen desaturation. EXAM: PORTABLE CHEST 1 VIEW COMPARISON:  PA and lateral chest 08/13/2017 and 08/04/2017. FINDINGS: Right greater than left pleural effusions and airspace disease have markedly worsened since the most recent examination. There is cardiomegaly. No pneumothorax. No acute or bony abnormality. IMPRESSION: Marked worsening of right greater than left pleural effusions and airspace disease which could be due to pneumonia and/or pulmonary edema. Electronically  Signed   By: Inge Rise M.D.   On: 08/25/2017 10:31   Results/Tests Pending at Time of Discharge: None  Discharge Medications:  Allergies as of 08/30/2017      Reactions   Penicillins Other (See Comments)   Has patient had a PCN reaction causing immediate rash, facial/tongue/throat swelling, SOB or lightheadedness with hypotension: Unknown Has patient had a PCN  reaction causing severe rash involving mucus membranes or skin necrosis: Unknown Has patient had a PCN reaction that required hospitalization: Unknown Has patient had a PCN reaction occurring within the last 10 years: Unknown If all of the above answers are "NO", then may proceed with Cephalosporin use.      Medication List    STOP taking these medications   allopurinol 100 MG tablet Commonly known as:  ZYLOPRIM   amLODipine 10 MG tablet Commonly known as:  NORVASC   levofloxacin 750 MG tablet Commonly known as:  LEVAQUIN   minoxidil 10 MG tablet Commonly known as:  LONITEN     TAKE these medications   acetaminophen 325 MG tablet Commonly known as:  TYLENOL Take 650 mg by mouth every 8 (eight) hours as needed for mild pain or fever.   aspirin EC 81 MG tablet Take 1 tablet (81 mg total) by mouth daily.   atorvastatin 40 MG tablet Commonly known as:  LIPITOR TAKE ONE (1) TABLET BY MOUTH EVERY DAY AT 6:00PM What changed:  See the new instructions.   carvedilol 12.5 MG tablet Commonly known as:  COREG Take 1 tablet (12.5 mg total) by mouth 2 (two) times daily with a meal. What changed:    medication strength  See the new instructions.   cloNIDine 0.2 mg/24hr patch Commonly known as:  CATAPRES - Dosed in mg/24 hr Place 1 patch (0.2 mg total) onto the skin once a week. What changed:  when to take this   Darbepoetin Alfa 40 MCG/0.4ML Sosy injection Commonly known as:  ARANESP Inject 0.4 mLs (40 mcg total) into the skin every 30 (thirty) days. Start taking on:  09/28/2017   doxazosin 8 MG tablet Commonly known as:  CARDURA Take 1 tablet (8 mg total) by mouth every evening.   feeding supplement (ENSURE ENLIVE) Liqd Take 237 mLs by mouth 3 (three) times daily between meals.   ferrous sulfate 325 (65 FE) MG tablet Commonly known as:  FEROSUL Take 1 tablet (325 mg total) by mouth daily at 12 noon. Start taking on:  08/31/2017 What changed:    medication  strength  See the new instructions.   glucose blood test strip Commonly known as:  ONETOUCH VERIO Use as instructed to test three times daily. ICD-10 code: E11.22   guaiFENesin 600 MG 12 hr tablet Commonly known as:  MUCINEX Take 2 tablets (1,200 mg total) by mouth 2 (two) times daily for 2 days.   hydrALAZINE 100 MG tablet Commonly known as:  APRESOLINE TAKE ONE TABLET BY MOUTH EVERY EIGHT HOURS   insulin aspart 100 UNIT/ML FlexPen Commonly known as:  NOVOLOG FLEXPEN Inject 15 Units 2 (two) times daily before lunch and supper into the skin. What changed:    how much to take  when to take this  additional instructions   insulin glargine 100 UNIT/ML injection Commonly known as:  LANTUS Inject 24 Units into the skin at bedtime.   ipratropium-albuterol 0.5-2.5 (3) MG/3ML Soln Commonly known as:  DUONEB Take 3 mLs by nebulization 4 (four) times daily as needed (shortness of breath and wheezing).  isosorbide mononitrate 60 MG 24 hr tablet Commonly known as:  IMDUR TAKE ONE (1) TABLET BY MOUTH EVERY DAY What changed:  See the new instructions.   levothyroxine 75 MCG tablet Commonly known as:  SYNTHROID, LEVOTHROID Take 75 mcg by mouth daily before breakfast.   nitroGLYCERIN 0.4 MG SL tablet Commonly known as:  NITROSTAT Place 1 tablet (0.4 mg total) under the tongue every 5 (five) minutes as needed for chest pain.   ONETOUCH DELICA LANCETS 68S Misc 1 each by Does not apply route 3 (three) times daily. ICD-10 code: E11.22   [facility] VERIO FLEX SYSTEM w/Device Kit 1 kit by Does not apply route daily.   pantoprazole 40 MG tablet Commonly known as:  PROTONIX Take 40 mg by mouth daily.   polyethylene glycol packet Commonly known as:  MIRALAX / GLYCOLAX Take 17 g by mouth daily as needed for mild constipation or moderate constipation. What changed:    when to take this  reasons to take this   torsemide 20 MG tablet Commonly known as:  DEMADEX Take 2 tablets  (40 mg total) by mouth daily. Start taking on:  08/31/2017 What changed:  how much to take            Durable Medical Equipment  (From admission, onward)        Start     Ordered   08/30/17 1447  DME Oxygen  Once    Comments:  Goal O2 sats 88-92%  Question Answer Comment  Mode or (Route) Nasal cannula   Liters per Minute 2   Oxygen delivery system Gas      08/30/17 1449      Discharge Instructions: Please refer to Patient Instructions section of EMR for full details.  Patient was counseled important signs and symptoms that should prompt return to medical care, changes in medications, dietary instructions, activity restrictions, and follow up appointments.   Follow-Up Appointments:  Contact information for follow-up providers    Rittberger, Bernita Raisin, DO. Go on 09/04/2017.   Why:  1:45 pm Contact information: [address] [address] [facility]. Schedule an appointment as soon as possible for a visit in 2 week(s).   Specialty:  Pulmonology Contact information: [place] [phone]           Contact information for after-discharge care    Destination    [facility] Follow up.   Service:  Skilled Nursing Contact information: [place], [place], DO 08/30/2017, 2:50 PM PGY-1, [place]

## 2017-08-26 NOTE — NC FL2 (Signed)
[place] MEDICAID FL2 LEVEL OF CARE SCREENING TOOL     IDENTIFICATION  [name] Birthdate: 11-05-1950 Sex: male Admission Date (Current Location): 08/25/2017  [facility] and [place] Number:  Herbalist and Address:  [facility] [place], [place] [address], [place], [place] [address]      Provider Number: [id number]  Attending Physician Name and Address:  McDiarmid, Blane Ohara, MD  Relative Name and Phone Number:       Current Level of Care: Hospital Recommended Level of Care: [place] Prior Approval Number:    Date Approved/Denied:   PASRR Number: [id number]  Discharge Plan: SNF    Current Diagnoses: Patient Active Problem List   Diagnosis Date Noted  . Chronic respiratory failure with hypoxia ([place]) 08/25/2017  . Pulmonary artery hypertension ([place]) 07/25/2017  . Pressure injury of skin 07/20/2017  . Diabetic polyneuropathy associated with type 2 diabetes mellitus ([place]) 01/14/2017  . Pleural effusion   . Chronic anemia 02/28/2016  . Diabetic retinopathy ([place]) 11/13/2015  . Iron deficiency anemia 08/03/2015  . Type II diabetes mellitus with complication ([place])   . 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 Deficiency anemia 01/15/2015  . CKD (chronic kidney disease) stage 4, GFR 15-29 ml/min (HCC) 01/15/2015  . Hypothyroidism 11/22/2014  . Acute on chronic diastolic heart failure ([place]) 07/04/2014  . COPD GOLD III  02/16/2014  . Anemia of renal disease   . History of tobacco use 08/23/2013  . 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 CAD (coronary artery disease) 04/06/2013  . PVD - hx of Rt SFA PTA and s/p Rt 4-5th toe amp Dec 2014 04/05/2013  . 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 Mixed hyperlipidemia 07/17/2008    Orientation RESPIRATION BLADDER Height & Weight     Self, Time, Situation, Place  O2(Nasal Canula 2 L) Incontinent, External catheter Weight: 181 lb 3.5 oz (82.2 kg) Height:  5\' 8"   (172.7 cm)  BEHAVIORAL SYMPTOMS/MOOD NEUROLOGICAL BOWEL NUTRITION STATUS  (None) (None) Incontinent Diet(2 gram sodium)  AMBULATORY STATUS COMMUNICATION OF NEEDS Skin     Verbally Bruising, Other (Comment)(Amputation, skin tear.)                       Personal Care Assistance Level of Assistance              Functional Limitations Info  Sight, Hearing, Speech Sight Info: Adequate Hearing Info: Adequate Speech Info: Adequate    SPECIAL CARE FACTORS FREQUENCY  Blood pressure                    Contractures Contractures Info: Not present    Additional Factors Info  Code Status, Allergies Code Status Info: Full code Allergies Info: Penicillins           Current Medications (08/26/2017):  This is the current hospital active medication list Current Facility-Administered Medications  Medication Dose Route Frequency Provider Last Rate Last Dose  . 0.9 %  sodium chloride infusion  250 mL Intravenous PRN Sherene Sires, DO      . acetaminophen (TYLENOL) tablet 650 mg  650 mg Oral Q4H PRN Sherene Sires, DO      . aspirin EC tablet 81 mg  81 mg Oral Daily Bland, Scott, DO   81 mg at 08/26/17 0844  . atorvastatin (LIPITOR) tablet 40 mg  40 mg Oral q1800 Bland, Scott, DO      . carvedilol (COREG) tablet  12.5 mg  12.5 mg Oral BID WC Riccio, Angela C, DO      . cloNIDine (CATAPRES - Dosed in mg/24 hr) patch 0.2 mg  0.2 mg Transdermal Weekly Bland, Scott, DO   0.2 mg at 08/25/17 2229  . enoxaparin (LOVENOX) injection 30 mg  30 mg Subcutaneous Q24H Bland, Scott, DO   30 mg at 08/25/17 1639  . feeding supplement (ENSURE ENLIVE) (ENSURE ENLIVE) liquid 237 mL  237 mL Oral TID BM Bland, Scott, DO   237 mL at 08/26/17 0845  . ferrous sulfate tablet 325 mg  325 mg Oral Q1200 Sherene Sires, DO   325 mg at 08/26/17 1218  . insulin aspart (novoLOG) injection 0-9 Units  0-9 Units Subcutaneous TID WC Bland, Scott, DO   1 Units at 08/26/17 1218  . ipratropium-albuterol (DUONEB) 0.5-2.5 (3)  MG/3ML nebulizer solution 3 mL  3 mL Nebulization QID PRN Sherene Sires, DO      . isosorbide mononitrate (IMDUR) 24 hr tablet 60 mg  60 mg Oral QHS Bland, Scott, DO   60 mg at 08/25/17 2229  . levothyroxine (SYNTHROID, LEVOTHROID) tablet 75 mcg  75 mcg Oral QAC breakfast Sherene Sires, DO   75 mcg at 08/26/17 0524  . [START ON 08/27/2017] MEDLINE mouth rinse  15 mL Mouth Rinse BID McDiarmid, Blane Ohara, MD      . nitroGLYCERIN (NITROSTAT) SL tablet 0.4 mg  0.4 mg Sublingual Q5 min PRN Sherene Sires, DO      . ondansetron (ZOFRAN) injection 4 mg  4 mg Intravenous Q6H PRN Bland, Scott, DO      . polyethylene glycol (MIRALAX / GLYCOLAX) packet 17 g  17 g Oral Daily PRN Bland, Scott, DO      . sodium chloride flush (NS) 0.9 % injection 3 mL  3 mL Intravenous Q12H Bland, Scott, DO   3 mL at 08/26/17 0845  . sodium chloride flush (NS) 0.9 % injection 3 mL  3 mL Intravenous PRN Sherene Sires, DO       Facility-Administered Medications Ordered in Other Encounters  Medication Dose Route Frequency Provider Last Rate Last Dose  . sodium chloride flush (NS) 0.9 % injection 10 mL  10 mL Intracatheter PRN Heath Lark, MD         Discharge Medications: Please see discharge summary for a list of discharge medications.  Relevant Imaging Results:  Relevant Lab Results:   Additional Information SS#: [id number]  Candie Chroman, LCSW

## 2017-08-26 NOTE — Consult Note (Addendum)
Advanced Heart Failure Team Consult Note   Primary Physician: Lind Covert, MD PCP-Cardiologist:  No primary care provider on file.  Reason for Consultation: Acute on chronic diastolic CHF  HPI:    Leonard [name] is seen today for evaluation of Acute on chronic diastolic CHF at the request of Dr. McDiarmid.   Leonard [name] is a 67 y.o. male with history of CAD s/p CABG 2015, extensive PAD s/p left knee AKA, chronic diastolic CHF, CKD stage IV, COPD on home oxygen, HTN, and DM2.  Last seen in CHF clinic 10/01/16. Weight and volume were stable/improved since switching him to torsemide. He denies SOB with ADLs or stairs, but continued to eat a high salt diet. Was in paramedicine program at that time. No changes made at that visit.   Over past several months, pt has had 3 admission for respiratory failure (this being his third). He presented to [facility] 08/25/17 with worsening SOB. Most recently was admitted for HCAP and d/c on 08/14/17. Pertinent labs on admission include BNP 526, WBC 7.7, Lactic acid WNL, CR 2.9 on admission up from baseline 1.6-1.8. Hgb 8.1. CXR showed worsening of R > L pleural effusions. Admitted for further evaluation and treatment. Given IV lasix cautiously with AKI. Blood cultures sent.    HF team consulted to assist in assessment and management of his volume in setting of A/C diastolic CHF and pulmonary HTN.   He states he has been feeling worse over the past week or so. He has been in SNF since his admission in May. Meds and meals are prepared for him.  He is SOB with minimal activity. Somewhat limited from his L AKA. He is not very active overall. Has LUE AVF, but does not get dialysis. He has some orthopnea. Pt dozes in and out during my assessment.   Weight unchanged with IV lasix. Cr 3.00 this am. (this is up from 1.9 2 weeks ago)  Echo 07/06/2017 LVEF 65-70%, Grade 2 DD, Mild TR, PA peak pressure 46 mm/Hg Echo 07/20/17 Repeated limited echo to re-assess  RV and PA pressure. PA peak pressure 63 mm Hg. RV OK.   Review of systems complete and found to be negative unless listed in HPI.    Home Medications Prior to Admission medications   Medication Sig Start Date End Date Taking? Authorizing Provider  acetaminophen (TYLENOL) 325 MG tablet Take 650 mg by mouth every 8 (eight) hours as needed for mild pain or fever.    Yes [provider]  allopurinol (ZYLOPRIM) 100 MG tablet Take 100 mg by mouth daily. 05/04/17  Yes [provider]  amLODipine (NORVASC) 10 MG tablet Take 1 tablet (10 mg total) by mouth daily. Needs office visit for further refills 04/08/17  Yes Bensimhon, Shaune Pascal, MD  aspirin EC 81 MG tablet Take 1 tablet (81 mg total) by mouth daily. 08/07/17 08/07/18 Yes Riccio, Angela C, DO  atorvastatin (LIPITOR) 40 MG tablet TAKE ONE (1) TABLET BY MOUTH EVERY DAY AT 6:00PM Patient taking differently: Take 40 mg by mouth once a day at 6 PM 11/17/16  Yes Larey Dresser, MD  carvedilol (COREG) 25 MG tablet TAKE TWO (2) TABLETS BY MOUTH 2 TIMES DAILY WITH A MEAL Patient taking differently: TAKE TWO TABLETS (28m) BY MOUTH 2 TIMES DAILY WITH A MEAL 01/14/17  Yes Bensimhon, DShaune Pascal MD  cloNIDine (CATAPRES - DOSED IN MG/24 HR) 0.2 mg/24hr patch Place 1 patch (0.2 mg total) onto the skin once  a week. Patient taking differently: Place 0.2 mg onto the skin every Monday.  08/09/17  Yes Lucila Maine C, DO  doxazosin (CARDURA) 8 MG tablet Take 1 tablet (8 mg total) by mouth every evening. 12/01/16  Yes Hensel, Jamal Collin, MD  feeding supplement, ENSURE ENLIVE, (ENSURE ENLIVE) LIQD Take 237 mLs by mouth 3 (three) times daily between meals. 08/07/17  Yes Riccio, Angela C, DO  FEROSUL 325 (65 Fe) MG tablet TAKE ONE (1) TABLET BY MOUTH TWO (2) TIMES DAILY WITH A MEAL Patient taking differently: TAKE 325 MG BY MOUTH TWO (2) TIMES DAILY WITH A MEAL 03/03/17  Yes [name], MD  hydrALAZINE (APRESOLINE) 100 MG tablet TAKE ONE TABLET BY MOUTH  EVERY EIGHT HOURS 11/14/16  Yes Gambino, Arlie Solomons, MD  insulin aspart (NOVOLOG FLEXPEN) 100 UNIT/ML FlexPen Inject 15 Units 2 (two) times daily before lunch and supper into the skin. Patient taking differently: Inject 0-15 Units into the skin See admin instructions. Inject as per sliding scale: 0-150= 0 units 151-400=15 units Call PCP if CBG<70 or >401 12/15/16  Yes Hensel, Jamal Collin, MD  insulin glargine (LANTUS) 100 UNIT/ML injection Inject 24 Units into the skin at bedtime.   Yes [provider]  ipratropium-albuterol (DUONEB) 0.5-2.5 (3) MG/3ML SOLN Take 3 mLs by nebulization 4 (four) times daily as needed (shortness of breath and wheezing).    Yes [provider]  isosorbide mononitrate (IMDUR) 60 MG 24 hr tablet TAKE ONE (1) TABLET BY MOUTH EVERY DAY Patient taking differently: TAKE ONE (1) TABLET BY MOUTH AT BEDTIME 01/14/17  Yes Bensimhon, Shaune Pascal, MD  levothyroxine (SYNTHROID, LEVOTHROID) 75 MCG tablet Take 75 mcg by mouth daily before breakfast.    Yes [provider]  minoxidil (LONITEN) 10 MG tablet Take 0.5 tablets (5 mg total) by mouth daily. Patient taking differently: Take 10 mg by mouth daily.  08/08/17  Yes Riccio, Angela C, DO  nitroGLYCERIN (NITROSTAT) 0.4 MG SL tablet Place 1 tablet (0.4 mg total) under the tongue every 5 (five) minutes as needed for chest pain. 11/14/16  Yes [name], MD  pantoprazole (PROTONIX) 40 MG tablet Take 40 mg by mouth daily.   Yes [provider]  polyethylene glycol (MIRALAX / GLYCOLAX) packet Take 17 g by mouth daily as needed for mild constipation or moderate constipation. Patient taking differently: Take 17 g by mouth every 8 (eight) hours as needed for mild constipation.  08/07/17  Yes Riccio, Angela C, DO  torsemide (DEMADEX) 20 MG tablet Take 1 tablet (20 mg total) by mouth daily. 08/08/17  Yes Riccio, Angela C, DO  Blood Glucose Monitoring Suppl (ONETOUCH VERIO FLEX SYSTEM) w/Device KIT 1 kit by  Does not apply route daily. 10/10/16   McDiarmid, Blane Ohara, MD  glucose blood (ONETOUCH VERIO) test strip Use as instructed to test three times daily. ICD-10 code: E11.22 10/15/16   McDiarmid, Blane Ohara, MD  levofloxacin (LEVAQUIN) 750 MG tablet Take 750 mg by mouth every other day. Respiratory depression secondary to excessive o2 use for 2 Administrations ended on 08/18/17 08/16/17   [provider]  [facility] DELICA LANCETS 25D MISC 1 each by Does not apply route 3 (three) times daily. ICD-10 code: E11.22 10/15/16   McDiarmid, Blane Ohara, MD    Past Medical History: Past Medical History:  Diagnosis Date  . Acute on chronic diastolic heart failure ([place]) 07/04/2014  . Anemia of renal disease   . Atherosclerotic peripheral vascular disease with gangrene ([place]) 02/14/2017  .  CAD (coronary artery disease) 04/06/2013  . Cancer ([place])    skin cancer-  . CHF (congestive heart failure) ([place])   . Chronic anemia 02/28/2016  . Chronic deep vein thrombosis (DVT) of femoral vein of left lower extremity ([place]) 09/03/2016  . Chronic respiratory failure with hypoxia ([place]) 08/25/2017  . CKD (chronic kidney disease) stage 4, GFR 15-29 ml/min (HCC) 01/15/2015   Follows with [facility].  Last visits:  07/23/16: No change in meds. Worried that if we increase diuretics could lead to another AKI  . CKD (chronic kidney disease), stage IV ([place])    07/07/2017  . Claudication ([place])   . Collapse of left talus 01/14/2017  . COPD GOLD III  02/16/2014   Quit smoking 2014  - PFT's  04/06/13   FEV1 1.28 (44 % ) ratio 48  p no % improvement from saba p ?  prior to study with DLCO  43/54 % corrects to 97  % for alv volume - 01/31/2016  After extensive coaching HFA effectiveness =    75% from baseline 0 > changed to sprivia respimat   - PFT's  03/17/2016  FEV1 2.25  (81 % ) ratio 88  p 2 % improvement from saba p spiriva respimat x 2  prior to study with  . Coronary artery disease   . Diabetes ([place])   . Diabetic polyneuropathy associated  with type 2 diabetes mellitus ([place]) 01/14/2017  . Diabetic retinopathy ([place]) 11/13/2015   Per Dr. Erenest Rasher eye exam in 10/17/15, mild background diabetic retinopathy  . Fatigue associated with anemia 01/21/2016  . Gangrene of toe ([place])    right 5th/notes 01/04/2013   . HCAP (healthcare-associated pneumonia)   . Hematuria 06/27/2015  . High cholesterol   . History of tobacco use 08/23/2013   Smoked pack per day for 46 years Quit smoking - 02/03/2013   . Hypertension   . Hypothyroidism 11/22/2014  . Idiopathic chronic gout of left foot without tophus   . Iron deficiency anemia 08/03/2015  . Left foot pain 10/29/2016  . Long term (current) use of anticoagulants [Z79.01] 09/09/2016  . 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 Mixed hyperlipidemia 07/17/2008  . Morbid obesity due to excess calories ([place]) 03/17/2016  . PAD (peripheral artery disease) ([place])   . Pleural effusion   . Pneumonitis 07/26/2017  . Pulmonary artery hypertension ([place]) 07/25/2017  . PVD - hx of Rt SFA PTA and s/p Rt 4-5th toe amp Dec 2014 04/05/2013  . 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 Respiratory failure ([place]) 08/13/2017  . 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 Secondary hypertension   . Shortness of breath dyspnea    with exertion  . Skin lesion of face 02/22/2015  . SOB (shortness of breath)   . Type 2 diabetes mellitus with pressure callus ([place]) 12/29/2016  . Type II diabetes mellitus (HCC)    Type II  . Type II diabetes mellitus with complication [facility])     Past Surgical History: Past Surgical History:  Procedure Laterality Date  . ABDOMINAL AORTOGRAM W/LOWER EXTREMITY N/A 03/12/2017   Procedure: ABDOMINAL AORTOGRAM W/LOWER EXTREMITY;  Surgeon: Conrad Tamarack, MD;  Location: [place] CV [facility];  Service: Cardiovascular;  Laterality: N/A;  . AMPUTATION Right 01/28/2013   Procedure: RAY AMPUTATION RIGHT  4th & 5th TOE;  Surgeon: Rosetta Posner, MD;  Location: [place];  Service: Vascular;  Laterality: Right;  .  AMPUTATION Left 03/15/2017   Procedure: AMPUTATION ABOVE KNEE;  Surgeon:  Waynetta Sandy, MD;  Location: [place];  Service: Vascular;  Laterality: Left;  . ANGIOPLASTY / STENTING FEMORAL Right 01/13/2013   superficial femoral artery x 2 (3 mm x 60 mm, 4 mm x 60 mm)  . AV FISTULA PLACEMENT Left 02/11/2016   Procedure: LEFT UPPER ARM BRACHIAL CEPHALIC ARTERIOVENOUS (AV) FISTULA CREATION;  Surgeon: Conrad Swink, MD;  Location: [place];  Service: Vascular;  Laterality: Left;  . COLONOSCOPY W/ POLYPECTOMY    . CORONARY ARTERY BYPASS GRAFT N/A 04/07/2013   Procedure: CORONARY ARTERY BYPASS GRAFTING (CABG);  Surgeon: Ivin Poot, MD;  Location: [place];  Service: Open Heart Surgery;  Laterality: N/A;  . INTRAOPERATIVE TRANSESOPHAGEAL ECHOCARDIOGRAM N/A 04/07/2013   Procedure: INTRAOPERATIVE TRANSESOPHAGEAL ECHOCARDIOGRAM;  Surgeon: Ivin Poot, MD;  Location: [place];  Service: Open Heart Surgery;  Laterality: N/A;  . LEFT HEART CATHETERIZATION WITH CORONARY ANGIOGRAM N/A 04/05/2013   Procedure: LEFT HEART CATHETERIZATION WITH CORONARY ANGIOGRAM;  Surgeon: Peter M Martinique, MD;  Location: [facility] CATH LAB;  Service: Cardiovascular;  Laterality: N/A;  . LOWER EXTREMITY ANGIOGRAM Bilateral 01/06/2013   Procedure: LOWER EXTREMITY ANGIOGRAM;  Surgeon: Conrad Bunkerville, MD;  Location: [facility] CATH LAB;  Service: Cardiovascular;  Laterality: Bilateral;  . LOWER EXTREMITY ANGIOGRAM Right 01/13/2013   Procedure: LOWER EXTREMITY ANGIOGRAM;  Surgeon: Conrad [place], MD;  Location: [facility] CATH LAB;  Service: Cardiovascular;  Laterality: Right;  . SKIN CANCER EXCISION  2017  . THROAT SURGERY  1983   polyps  . TONSILLECTOMY AND ADENOIDECTOMY  ~ 1965    Family History: Family History  Problem Relation Age of Onset  . Cancer Mother        lung cancer  . Hypertension Mother   . Cancer Sister        patient thinks it was uterine cancer  . Hypertension Sister   . Heart attack Sister   . Stroke Neg Hx     Social  History: Social History   Socioeconomic History  . Marital status: Single    Spouse name: Not on file  . Number of children: Not on file  . Years of education: Not on file  . Highest education level: Not on file  Occupational History  . Not on file  Social Needs  . Financial resource strain: Not on file  . Food insecurity:    Worry: Not on file    Inability: Not on file  . Transportation needs:    Medical: Not on file    Non-medical: Not on file  Tobacco Use  . Smoking status: Former Smoker    Packs/day: 1.00    Years: 46.00    Pack years: 46.00    Types: Cigarettes    Start date: 02/04/1967    Last attempt to quit: 02/03/2013    Years since quitting: 4.5  . Smokeless tobacco: Never Used  Substance and Sexual Activity  . Alcohol use: No    Alcohol/week: 0.0 oz  . Drug use: No  . Sexual activity: Not Currently  Lifestyle  . Physical activity:    Days per week: Not on file    Minutes per session: Not on file  . Stress: Not on file  Relationships  . Social connections:    Talks on phone: Not on file    Gets together: Not on file    Attends religious service: Not on file    Active member of club or organization: Not on file    Attends meetings of clubs  or organizations: Not on file    Relationship status: Not on file  Other Topics Concern  . Not on file  Social History Narrative   Lives with sister, [name] in [place].  Retired - former Sports coach.   Patient in skilled nursing facility    Allergies:  Allergies  Allergen Reactions  . Penicillins Other (See Comments)    Has patient had a PCN reaction causing immediate rash, facial/tongue/throat swelling, SOB or lightheadedness with hypotension: Unknown Has patient had a PCN reaction causing severe rash involving mucus membranes or skin necrosis: Unknown Has patient had a PCN reaction that required hospitalization: Unknown Has patient had a PCN reaction occurring within the last 10 years: Unknown If all of  the above answers are "NO", then may proceed with Cephalosporin use.     Objective:    Vital Signs:   Temp:  [98.1 F (36.7 C)-99.1 F (37.3 C)] 98.8 F (37.1 C) (07/24 1227) Pulse Rate:  [67-81] 67 (07/24 1227) Resp:  [18-24] 20 (07/24 1227) BP: (111-133)/(52-97) 131/57 (07/24 1227) SpO2:  [91 %-100 %] 94 % (07/24 1227) Weight:  [181 lb 3.5 oz (82.2 kg)-181 lb 7 oz (82.3 kg)] 181 lb 3.5 oz (82.2 kg) (07/24 0439) Last BM Date: 08/24/17  Weight change: Filed Weights   08/25/17 0942 08/25/17 1456 08/26/17 0439  Weight: 175 lb (79.4 kg) 181 lb 7 oz (82.3 kg) 181 lb 3.5 oz (82.2 kg)    Intake/Output:   Intake/Output Summary (Last 24 hours) at 08/26/2017 1305 Last data filed at 08/26/2017 1149 Gross per 24 hour  Intake 1140 ml  Output 950 ml  Net 190 ml    Physical Exam    General:  Chronically ill appearing. SOB with conversation.  HEENT: Normal Neck: supple. JVP elevated on exam. Carotids 2+ bilat; no bruits. No lymphadenopathy or thyromegaly appreciated. Cor: PMI nondisplaced. Regular rate & rhythm. No rubs, gallops or murmurs. Lungs: Diminished basilar sounds.  Abdomen: soft, nontender, nondistended. No hepatosplenomegaly. No bruits or masses. Good bowel sounds. Extremities: no cyanosis, clubbing, or rash. No edema.  Neuro: Alert to person and place, cranial nerves grossly intact. moves all 4 extremities w/o difficulty. Affect pleasant but drifts out of conversation.   Telemetry   NSR 70-80s, personally reviewed.   EKG    NSR 78 bpm, personally reviewed  Labs   Basic Metabolic Panel: Recent Labs  Lab 08/25/17 1002 08/26/17 0417  NA 140 140  K 4.7 4.5  CL 109 110  CO2 21* 22  GLUCOSE 150* 132*  BUN 38* 41*  CREATININE 2.91* 3.00*  CALCIUM 8.6* 8.4*    Liver Function Tests: No results for input(s): AST, ALT, ALKPHOS, BILITOT, PROT, ALBUMIN in the last 168 hours. No results for input(s): LIPASE, AMYLASE in the last 168 hours. No results for input(s):  AMMONIA in the last 168 hours.  CBC: Recent Labs  Lab 08/25/17 1002 08/26/17 0417  WBC 7.7 3.7*  HGB 8.1* 7.2*  HCT 26.6* 23.5*  MCV 90.2 88.7  PLT 184 160    Cardiac Enzymes: Recent Labs  Lab 08/25/17 1002  TROPONINI <0.03    BNP: BNP (last 3 results) Recent Labs    07/22/17 1044 08/13/17 1050 08/25/17 1001  BNP 372.8* 334.1* 526.9*    ProBNP (last 3 results) No results for input(s): PROBNP in the last 8760 hours.   CBG: Recent Labs  Lab 08/25/17 1629 08/25/17 2111 08/26/17 0735 08/26/17 1131  GLUCAP 122* 105* 126* 147*    Coagulation  Studies: No results for input(s): LABPROT, INR in the last 72 hours.   Imaging    No results found.   Medications:     Current Medications: . aspirin EC  81 mg Oral Daily  . atorvastatin  40 mg Oral q1800  . carvedilol  12.5 mg Oral BID WC  . cloNIDine  0.2 mg Transdermal Weekly  . enoxaparin (LOVENOX) injection  30 mg Subcutaneous Q24H  . feeding supplement (ENSURE ENLIVE)  237 mL Oral TID BM  . ferrous sulfate  325 mg Oral Q1200  . insulin aspart  0-9 Units Subcutaneous TID WC  . isosorbide mononitrate  60 mg Oral QHS  . levothyroxine  75 mcg Oral QAC breakfast  . [START ON 08/27/2017] mouth rinse  15 mL Mouth Rinse BID  . sodium chloride flush  3 mL Intravenous Q12H     Infusions: . sodium chloride        Patient Profile   Leonard [name] is a 67 y.o. male with history of CAD s/p CABG, PAD, chronic diastolic CHF, CKD stage IV, COPD on home oxygen, and HTN.  Admitted 0/07/12 with A/C diastolic CHF  Assessment/Plan   1. Acute on chronic diastolic CHF:  - Echo 02/11/7586 LVEF 65-70%, Grade 2 DD, Mild TR, PA peak pressure 46 mm/Hg - Echo 07/20/17 Repeated limited echo to re-assess RV and PA pressure. PA peak pressure 63 mm Hg. RV OK.  - Volume status appears elevated on exam.  - Would continue IV diuresis for now and follow creatinine.   2. ARF on CKD V - He has a LUE AV fistula. Would involve  renal if cr worsens further.  - Follow closely with attempts to diuresis.   3. Pulmonary HTN - PA peak pressure 63 mmHg on Echo 07/20/17 - Suspect primarily WHO Group III and WHO Group II pulmonary HTN. Suspect he has mild/mod Pulm Hypertension at baseline and pressures were elevated in the setting of volume overload.  - May need RHC to confirm, especially if diuresis limited by ARF.   4. CAD s.p CABG 2015 - No s/s of ischemia.    - Does not appear to have had ischemic work up since.  - Continue ASA and statin.      5. Chronic resp failure on home O2 in setting of COPD: Gold stage III - Per primary. Has had multiple admission over past 2 months.  - Continue Home O2.   6. h/o DVT - Diagnosed 08/30/16. Now off anticoagulation.    7. DM - Hgb A1C 6.9 07/05/2017. Has been under better control lately.  - Per Primary.   8. Extensive PAD - s/p L AKA for extensive LLE wounds with no re-vascularization options in 03/2017 by Dr. Donzetta Matters.   9. + Blood Cultures - Suspected contamination with S. Epidermidis. No ABX  - Repeat BCX pending  Medication concerns reviewed with patient and pharmacy team. Barriers identified: None at this time. Pt in SNF.   Length of Stay: 1  Annamaria Helling  08/26/2017, 1:05 PM  Advanced Heart Failure Team Pager [phone] (M-F; 7a - 4p)  Please contact [facility] for night-coverage after hours (4p -7a ) and weekends on [email or web address]  Patient seen with PA, agree with the above note.    He was admitted from SNF with CHF exacerbation and AKI, creatinine up to 3 from baseline 1.8-1.9.   ECG with NSR, CXR with pulmonary edema + bilateral effusions.  Also of note, he had a blood culture  with MRSE.   On exam, JVP 14-16 cm. 1+ right ankle edema, s/p L AKA.  Decreased breath sounds at bases.  Regular S1S2.   1.  Acute on chronic diastolic CHF: Echo 7/91 with EF 65-70%, PASP 63 mmHg, RV ok.  Suspect diastolic CHF with group 2/3 pulmonary hypertension.  On  exam, he is volume overloaded and CXR suggests volume overload as well.  This is complicated by creatinine up to 3.  - Will attempt diuresis, start with Lasix 120 mg IV bid and will have to follow UOP and creatinine.  If we can lower renal venous pressure, creatinine may improve.  2. AKI on CKD stage 4: He has a fistula already.  As above, he is clearly volume overloaded and short of breath so will attempt to diurese him.  3. Pulmonary HTN: Noted by echo.  Suspect combined group 2 (pulmonary venous hypertension) and group 3 (severe COPD) PH.  No indication for selective pulmonary vasodilators.  4. PAD: s/p left AKA.  Follows with [facility].  5. MRSE on blood cultures: Not sure why these were ordered, I do not see that he was febrile.  Primary service to discuss significance with ID.  6. CAD: s/p CABG 2015.  No chest pain.  - Continue statin, ASA 81.  7. Hypertension: BP stable on home regimen.  8. COPD: Severe obstruction on prior PFTs.  Uses home oxygen.   Loralie Champagne 08/26/2017 2:13 PM

## 2017-08-27 ENCOUNTER — Other Ambulatory Visit: Payer: Self-pay | Admitting: Licensed Clinical Social Worker

## 2017-08-27 DIAGNOSIS — R0902 Hypoxemia: Secondary | ICD-10-CM

## 2017-08-27 DIAGNOSIS — I5033 Acute on chronic diastolic (congestive) heart failure: Secondary | ICD-10-CM | POA: Diagnosis not present

## 2017-08-27 DIAGNOSIS — R0602 Shortness of breath: Secondary | ICD-10-CM | POA: Diagnosis not present

## 2017-08-27 DIAGNOSIS — N189 Chronic kidney disease, unspecified: Secondary | ICD-10-CM | POA: Diagnosis not present

## 2017-08-27 DIAGNOSIS — J9811 Atelectasis: Secondary | ICD-10-CM

## 2017-08-27 DIAGNOSIS — D631 Anemia in chronic kidney disease: Secondary | ICD-10-CM | POA: Diagnosis not present

## 2017-08-27 DIAGNOSIS — J189 Pneumonia, unspecified organism: Secondary | ICD-10-CM

## 2017-08-27 DIAGNOSIS — E440 Moderate protein-calorie malnutrition: Secondary | ICD-10-CM | POA: Diagnosis present

## 2017-08-27 DIAGNOSIS — N179 Acute kidney failure, unspecified: Secondary | ICD-10-CM | POA: Diagnosis not present

## 2017-08-27 DIAGNOSIS — I132 Hypertensive heart and chronic kidney disease with heart failure and with stage 5 chronic kidney disease, or end stage renal disease: Secondary | ICD-10-CM | POA: Diagnosis not present

## 2017-08-27 DIAGNOSIS — N185 Chronic kidney disease, stage 5: Secondary | ICD-10-CM | POA: Diagnosis not present

## 2017-08-27 DIAGNOSIS — J9611 Chronic respiratory failure with hypoxia: Secondary | ICD-10-CM | POA: Diagnosis not present

## 2017-08-27 DIAGNOSIS — N184 Chronic kidney disease, stage 4 (severe): Secondary | ICD-10-CM | POA: Diagnosis not present

## 2017-08-27 DIAGNOSIS — J90 Pleural effusion, not elsewhere classified: Secondary | ICD-10-CM | POA: Diagnosis not present

## 2017-08-27 DIAGNOSIS — J9601 Acute respiratory failure with hypoxia: Secondary | ICD-10-CM | POA: Diagnosis not present

## 2017-08-27 DIAGNOSIS — J181 Lobar pneumonia, unspecified organism: Secondary | ICD-10-CM | POA: Diagnosis not present

## 2017-08-27 DIAGNOSIS — J440 Chronic obstructive pulmonary disease with acute lower respiratory infection: Secondary | ICD-10-CM | POA: Diagnosis not present

## 2017-08-27 LAB — BASIC METABOLIC PANEL
ANION GAP: 11 (ref 5–15)
BUN: 42 mg/dL — AB (ref 8–23)
CO2: 22 mmol/L (ref 22–32)
Calcium: 8.4 mg/dL — ABNORMAL LOW (ref 8.9–10.3)
Chloride: 107 mmol/L (ref 98–111)
Creatinine, Ser: 2.85 mg/dL — ABNORMAL HIGH (ref 0.61–1.24)
GFR, EST AFRICAN AMERICAN: 25 mL/min — AB (ref >60)
GFR, EST NON AFRICAN AMERICAN: 22 mL/min — AB (ref >60)
Glucose, Bld: 165 mg/dL — ABNORMAL HIGH (ref 70–99)
POTASSIUM: 4.3 mmol/L (ref 3.5–5.1)
SODIUM: 140 mmol/L (ref 135–145)

## 2017-08-27 LAB — VITAMIN B12: Vitamin B-12: 346 pg/mL (ref 180–914)

## 2017-08-27 LAB — CBC
HCT: 24.2 % — ABNORMAL LOW (ref 39.0–52.0)
Hemoglobin: 7.4 g/dL — ABNORMAL LOW (ref 13.0–17.0)
MCH: 27.2 pg (ref 26.0–34.0)
MCHC: 30.6 g/dL (ref 30.0–36.0)
MCV: 89 fL (ref 78.0–100.0)
Platelets: 180 10*3/uL (ref 150–400)
RBC: 2.72 MIL/uL — ABNORMAL LOW (ref 4.22–5.81)
RDW: 17 % — ABNORMAL HIGH (ref 11.5–15.5)
WBC: 4.5 10*3/uL (ref 4.0–10.5)

## 2017-08-27 LAB — GLUCOSE, CAPILLARY
GLUCOSE-CAPILLARY: 141 mg/dL — AB (ref 70–99)
GLUCOSE-CAPILLARY: 152 mg/dL — AB (ref 70–99)
GLUCOSE-CAPILLARY: 164 mg/dL — AB (ref 70–99)
Glucose-Capillary: 150 mg/dL — ABNORMAL HIGH (ref 70–99)

## 2017-08-27 LAB — IRON AND TIBC
Iron: 37 ug/dL — ABNORMAL LOW (ref 45–182)
Saturation Ratios: 14 % — ABNORMAL LOW (ref 17.9–39.5)
TIBC: 259 ug/dL (ref 250–450)
UIBC: 222 ug/dL

## 2017-08-27 LAB — PROCALCITONIN: PROCALCITONIN: 0.15 ng/mL

## 2017-08-27 LAB — FOLATE: Folate: 9.8 ng/mL (ref >5.9)

## 2017-08-27 LAB — FERRITIN: FERRITIN: 296 ng/mL (ref 24–336)

## 2017-08-27 MED ORDER — METOLAZONE 5 MG PO TABS
5.0000 mg | ORAL_TABLET | Freq: Once | ORAL | Status: AC
  Administered 2017-08-27: 5 mg via ORAL
  Filled 2017-08-27: qty 1

## 2017-08-27 MED ORDER — GUAIFENESIN ER 600 MG PO TB12
1200.0000 mg | ORAL_TABLET | Freq: Two times a day (BID) | ORAL | Status: DC
  Administered 2017-08-27 – 2017-08-30 (×7): 1200 mg via ORAL
  Filled 2017-08-27 (×7): qty 2

## 2017-08-27 MED ORDER — SODIUM CHLORIDE 3 % IN NEBU
4.0000 mL | INHALATION_SOLUTION | Freq: Two times a day (BID) | RESPIRATORY_TRACT | Status: AC
  Administered 2017-08-27 – 2017-08-30 (×6): 4 mL via RESPIRATORY_TRACT
  Filled 2017-08-27 (×7): qty 4

## 2017-08-27 MED ORDER — FUROSEMIDE 10 MG/ML IJ SOLN
120.0000 mg | Freq: Two times a day (BID) | INTRAVENOUS | Status: DC
  Administered 2017-08-27 – 2017-08-30 (×7): 120 mg via INTRAVENOUS
  Filled 2017-08-27 (×2): qty 12
  Filled 2017-08-27: qty 2
  Filled 2017-08-27 (×5): qty 10

## 2017-08-27 NOTE — Patient Outreach (Signed)
[facility] [place]) Care Management  08/27/2017  [place] March 25, 1950 [id number]  [facility] CSW completed difficult case discussion with [facility] clinical team and presented patient's case and received feedback. [facility] CSW will continue to follow case and will await for patient's SNF discharge back to [facility].  Eula Fried, BSW, MSW, [email or web address] Phone: [phone] Fax: [phone]

## 2017-08-27 NOTE — Consult Note (Addendum)
Name: Leonard [name] MRN: [id number] DOB: [birth year]    ADMISSION DATE:  08/25/2017 CONSULTATION DATE:  08/27/17  REFERRING MD :  Dr. McDiarmid / FPTS   CHIEF COMPLAINT:  Shortness of breath   HISTORY OF PRESENT ILLNESS:  [name] who presented to [facility] ER via EMS from his skilled nursing facility on 7/23 with reports of shortness of breath.   The patient carries a medical history of chronic dCHF (LVEF 65-60% 07/2017), HTN, CAD, 2L O2 dependent GOLD III COPD, pulmonary hypertension (PA peak 43 on ECHO 07/2017), CKD (baseline sr cr ~1.6), DM & hypothyroidism.   He reported on admit he had been experiencing one week of shortness of breath and "like he can not get enough oxygen in". Initial labs notable for AKI with sr cr of 2.91, CO2 21, BNP 526 (up from 334 7/11) and hgb of 8.1 (7.5 on 7/12).  Initial CXR showed R>L pleural effusion.  He required O2 at 2L to maintain saturations >90%.  The patient was admitted per FPTS for concern of acute on chronic diastolic CHF.  Patients dry weight 163 lbs (per 04/2017 office visit) and on admit weighed 181 lbs. He was diuresed with 60 mg IV lasix.  I.O - 1L UOP in last 24 hours but remains positive balance.  Heart Failure team consulted 7/24.  He had a CT of the chest w/o contrast completed 7/24 which showed large right, moderate left pleural effusions, bibasilar atelectasis, diffuse airspace / interstitial infiltrates worrisome for edema and new RML consolidation with air bronchograms.    PCCM consulted for evaluation of effusion, RML airspace disease.    PAST MEDICAL HISTORY :   has a past medical history of Acute on chronic diastolic heart failure ([place]) (07/04/2014), Anemia of renal disease, Atherosclerotic peripheral vascular disease with gangrene ([place]) (02/14/2017), CAD (coronary artery disease) (04/06/2013), Cancer ([place]), CHF (congestive heart failure) ([place]), Chronic anemia (02/28/2016), Chronic deep vein thrombosis (DVT) of femoral vein of left lower  extremity ([place]) (09/03/2016), Chronic respiratory failure with hypoxia ([place]) (08/25/2017), CKD (chronic kidney disease) stage 4, GFR 15-29 ml/min ([place]) (01/15/2015), CKD (chronic kidney disease), stage IV ([place]), Claudication ([place]), Collapse of left talus (01/14/2017), COPD GOLD III  (02/16/2014), Coronary artery disease, Diabetes ([place]), Diabetic polyneuropathy associated with type 2 diabetes mellitus ([place]) (01/14/2017), Diabetic retinopathy ([place]) (11/13/2015), Fatigue associated with anemia (01/21/2016), Gangrene of toe ([place]), HCAP (healthcare-associated pneumonia), Hematuria (06/27/2015), High cholesterol, History of tobacco use (08/23/2013), Hypertension, Hypothyroidism (11/22/2014), Idiopathic chronic gout of left foot without tophus, Iron deficiency anemia (08/03/2015), Left foot pain (10/29/2016), Long term (current) use of anticoagulants [Z79.01] (09/09/2016), MGUS (monoclonal gammopathy of unknown significance) (01/15/2015), Mixed hyperlipidemia (07/17/2008), Morbid obesity due to excess calories ([place]) (03/17/2016), PAD (peripheral artery disease) ([place]), Pleural effusion, Pneumonitis (07/26/2017), Pulmonary artery hypertension ([place]) (07/25/2017), PVD - hx of Rt SFA PTA and s/p Rt 4-5th toe amp Dec 2014 (04/05/2013), Resistant hypertension (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   has a past surgical history that includes Throat surgery (1983); Angioplasty / stenting femoral (Right, 01/13/2013); Tonsillectomy and adenoidectomy (~ 1965); Amputation (Right, 01/28/2013); Coronary artery bypass graft (N/A, 04/07/2013); Intraoprative transesophageal echocardiogram (N/A, 04/07/2013); lower extremity angiogram (Bilateral, 01/06/2013);  lower extremity angiogram (Right, 01/13/2013); left heart catheterization with coronary angiogram (N/A,  04/05/2013); Skin cancer excision (2017); Colonoscopy w/ polypectomy; AV fistula placement (Left, 02/11/2016); ABDOMINAL AORTOGRAM W/LOWER EXTREMITY (N/A, 03/12/2017); and Amputation (Left, 03/15/2017).  Prior to Admission medications   Medication Sig Start Date End Date Taking? Authorizing Provider  acetaminophen (TYLENOL) 325 MG tablet Take 650 mg by mouth every 8 (eight) hours as needed for mild pain or fever.    Yes [provider]  allopurinol (ZYLOPRIM) 100 MG tablet Take 100 mg by mouth daily. 05/04/17  Yes [provider]  amLODipine (NORVASC) 10 MG tablet Take 1 tablet (10 mg total) by mouth daily. Needs office visit for further refills 04/08/17  Yes Bensimhon, Shaune Pascal, MD  aspirin EC 81 MG tablet Take 1 tablet (81 mg total) by mouth daily. 08/07/17 08/07/18 Yes Riccio, Angela C, DO  atorvastatin (LIPITOR) 40 MG tablet TAKE ONE (1) TABLET BY MOUTH EVERY DAY AT 6:00PM Patient taking differently: Take 40 mg by mouth once a day at 6 PM 11/17/16  Yes Larey Dresser, MD  carvedilol (COREG) 25 MG tablet TAKE TWO (2) TABLETS BY MOUTH 2 TIMES DAILY WITH A MEAL Patient taking differently: TAKE TWO TABLETS (8m) BY MOUTH 2 TIMES DAILY WITH A MEAL 01/14/17  Yes Bensimhon, DShaune Pascal MD  cloNIDine (CATAPRES - DOSED IN MG/24 HR) 0.2 mg/24hr patch Place 1 patch (0.2 mg total) onto the skin once a week. Patient taking differently: Place 0.2 mg onto the skin every Monday.  08/09/17  Yes RLucila MaineC, DO  doxazosin (CARDURA) 8 MG tablet Take 1 tablet (8 mg total) by mouth every evening. 12/01/16  Yes Hensel, WJamal Collin MD  feeding supplement, ENSURE ENLIVE, (ENSURE ENLIVE) LIQD Take 237 mLs by mouth 3 (three) times daily between meals. 08/07/17  Yes Riccio, Angela C, DO  FEROSUL 325 (65 Fe) MG tablet TAKE ONE (1) TABLET BY MOUTH TWO (2) TIMES DAILY WITH A MEAL Patient taking differently: TAKE 325 MG  BY MOUTH TWO (2) TIMES DAILY WITH A MEAL 03/03/17  Yes GCarlyle Dolly MD  hydrALAZINE (APRESOLINE) 100 MG tablet TAKE ONE TABLET BY MOUTH EVERY EIGHT HOURS 11/14/16  Yes Gambino, CArlie Solomons MD  insulin aspart (NOVOLOG FLEXPEN) 100 UNIT/ML FlexPen Inject 15 Units 2 (two) times daily before lunch and supper into the skin. Patient taking differently: Inject 0-15 Units into the skin See admin instructions. Inject as per sliding scale: 0-150= 0 units 151-400=15 units Call PCP if CBG<70 or >401 12/15/16  Yes Hensel, WJamal Collin MD  insulin glargine (LANTUS) 100 UNIT/ML injection Inject 24 Units into the skin at bedtime.   Yes [provider]  ipratropium-albuterol (DUONEB) 0.5-2.5 (3) MG/3ML SOLN Take 3 mLs by nebulization 4 (four) times daily as needed (shortness of breath and wheezing).    Yes [provider]  isosorbide mononitrate (IMDUR) 60 MG 24 hr tablet TAKE ONE (1) TABLET BY MOUTH EVERY DAY Patient taking differently: TAKE ONE (1) TABLET BY MOUTH AT BEDTIME 01/14/17  Yes Bensimhon, DShaune Pascal MD  levothyroxine (SYNTHROID, LEVOTHROID) 75 MCG tablet Take 75 mcg by mouth daily before breakfast.    Yes [provider]  minoxidil (LONITEN) 10 MG tablet Take 0.5 tablets (5 mg total) by mouth daily. Patient taking differently: Take 10 mg by mouth daily.  08/08/17  Yes Riccio, Angela C, DO  nitroGLYCERIN (NITROSTAT) 0.4 MG SL tablet Place 1 tablet (0.4 mg total) under the tongue every 5 (five) minutes as needed for chest pain. 11/14/16  Yes GCarlyle Dolly MD  pantoprazole ([place]  40 MG tablet Take 40 mg by mouth daily.   Yes [provider]  polyethylene glycol (MIRALAX / GLYCOLAX) packet Take 17 g by mouth daily as needed for mild constipation or moderate constipation. Patient taking differently: Take 17 g by mouth every 8 (eight) hours as needed for mild constipation.  08/07/17  Yes Riccio, Angela C, DO  torsemide (DEMADEX) 20 MG tablet Take 1 tablet (20  mg total) by mouth daily. 08/08/17  Yes Riccio, Angela C, DO  Blood Glucose Monitoring Suppl (ONETOUCH VERIO FLEX SYSTEM) w/Device KIT 1 kit by Does not apply route daily. 10/10/16   McDiarmid, Blane Ohara, MD  glucose blood (ONETOUCH VERIO) test strip Use as instructed to test three times daily. ICD-10 code: E11.22 10/15/16   McDiarmid, Blane Ohara, MD  levofloxacin (LEVAQUIN) 750 MG tablet Take 750 mg by mouth every other day. Respiratory depression secondary to excessive o2 use for 2 Administrations ended on 08/18/17 08/16/17   [provider]  [facility] DELICA LANCETS 90W MISC 1 each by Does not apply route 3 (three) times daily. ICD-10 code: E11.22 10/15/16   McDiarmid, Blane Ohara, MD    Allergies  Allergen Reactions  . Penicillins Other (See Comments)    Has patient had a PCN reaction causing immediate rash, facial/tongue/throat swelling, SOB or lightheadedness with hypotension: Unknown Has patient had a PCN reaction causing severe rash involving mucus membranes or skin necrosis: Unknown Has patient had a PCN reaction that required hospitalization: Unknown Has patient had a PCN reaction occurring within the last 10 years: Unknown If all of the above answers are "NO", then may proceed with Cephalosporin use.     FAMILY HISTORY:  family history includes Cancer in his mother and sister; Heart attack in his sister; Hypertension in his mother and sister.  SOCIAL HISTORY:  reports that he quit smoking about 4 years ago. His smoking use included cigarettes. He started smoking about 50 years ago. He has a 46.00 pack-year smoking history. He has never used smokeless tobacco. He reports that he does not drink alcohol or use drugs.  REVIEW OF SYSTEMS:  POSITIVES IN BOLD Constitutional: Negative for fever, chills, weight loss, malaise/fatigue and diaphoresis.  HENT: Negative for hearing loss, ear pain, nosebleeds, congestion, sore throat, neck pain, tinnitus and ear discharge.   Eyes: Negative for blurred  vision, double vision, photophobia, pain, discharge and redness.  Respiratory: Negative for cough, hemoptysis, sputum production, shortness of breath, wheezing and stridor.   Cardiovascular: Negative for chest pain, palpitations, orthopnea, claudication, leg swelling and PND.  Gastrointestinal: Negative for heartburn, nausea, vomiting, abdominal pain, diarrhea, constipation, blood in stool and melena.  Genitourinary: Negative for dysuria, urgency, frequency, hematuria and flank pain.  Musculoskeletal: Negative for myalgias, back pain, joint pain and falls.  Skin: Negative for itching and rash.  Neurological: Negative for dizziness, tingling, tremors, sensory change, speech change, focal weakness, seizures, loss of consciousness, weakness and headaches.  Endo/Heme/Allergies: Negative for environmental allergies and polydipsia. Does not bruise/bleed easily.   SUBJECTIVE:   VITAL SIGNS: Temp:  [97.6 F (36.4 C)-98 F (36.7 C)] 97.6 F (36.4 C) (07/25 0520) Pulse Rate:  [63-71] 71 (07/25 1030) Resp:  [14-22] 14 (07/25 0805) BP: (122-152)/(41-59) 122/41 (07/25 0805) SpO2:  [90 %-95 %] 95 % (07/25 1100) Weight:  [184 lb 4.9 oz (83.6 kg)] 184 lb 4.9 oz (83.6 kg) (07/25 0520)  PHYSICAL EXAMINATION: General:  Chronically ill appearing male in NAD lying in bed on his right side  Neuro:  Awakens  to voice, AAOx4, speech clear, MAE HEENT:  MM pink/moist, JVP+ Cardiovascular:  s1s2 rrr, no m/r/g Lungs:  Even/non-labored, diminished bases, coarse with occasional rhonchi  Abdomen:  Obese/soft, bsx4 active  Musculoskeletal:  No acute deformities  Skin:  Warm/dry, RLE trace edema, L AKA  Recent Labs  Lab 08/25/17 1002 08/26/17 0417 08/27/17 0556  NA 140 140 140  K 4.7 4.5 4.3  CL 109 110 107  CO2 21* 22 22  BUN 38* 41* 42*  CREATININE 2.91* 3.00* 2.85*  GLUCOSE 150* 132* 165*    Recent Labs  Lab 08/25/17 1002 08/26/17 0417 08/27/17 0556  HGB 8.1* 7.2* 7.4*  HCT 26.6* 23.5* 24.2*    WBC 7.7 3.7* 4.5  PLT 184 160 180    Ct Chest Wo Contrast  Result Date: 08/26/2017 CLINICAL DATA:  Pleural effusion and shortness of breath. Possible pneumonia. EXAM: CT CHEST WITHOUT CONTRAST TECHNIQUE: Multidetector CT imaging of the chest was performed following the standard protocol without IV contrast. COMPARISON:  Chest radiograph 08/25/2017.  CT chest 07/11/2017 FINDINGS: Cardiovascular: Diffuse cardiac enlargement. Coronary artery calcifications with postoperative changes consistent with coronary bypass. No pericardial effusion. Normal caliber thoracic aorta with calcification. Mediastinum/Nodes: Moderately prominent lymph nodes throughout the mediastinum and axillary regions are probably reactive. Esophagus is decompressed. Surgical clips. Lungs/Pleura: Large right and small to moderate left pleural effusions. Atelectasis in the lung bases. Focal consolidation with air bronchograms suggested in the right middle lung. This may reflect pneumonia. Hazy airspace and interstitial opacities likely to represent edema. No pneumothorax. Appearances are similar to previous study with interval development of the right middle lobe process. Airways are patent. Upper Abdomen: Limited visualization. No acute abnormality suggested. Musculoskeletal: Postoperative median sternotomy with sternotomy wires present. IMPRESSION: 1. Large right and small to moderate left pleural effusions. Atelectasis in the lung bases. Focal consolidation with air bronchograms in the right middle lobe likely representing pneumonia. Appearances are similar to previous study except for interval development of the right middle lobe process. 2. Diffuse airspace and interstitial infiltration in the remaining lungs likely indicates edema. 3. Cardiac enlargement. Aortic atherosclerosis. Postoperative coronary bypass. Aortic Atherosclerosis (ICD10-I70.0). Electronically Signed   By: Lucienne Capers M.D.   On: 08/26/2017 22:04    SIGNIFICANT  EVENTS  7/23  Admit with SOB 7/24  CHF consulted 7/25  PCCM consulted  STUDIES CT Chest 7/24 >> CT of the chest w/o contrast completed 7/24 which showed large right, moderate left pleural effusions, bibasilar atelectasis, diffuse airspace / interstitial infiltrates worrisome for edema and new RML consolidation with air bronchograms.  CULTURES BCID 7/23 >> coag neg staph detected BCx1 7/23 >> 1/2 with coag negative staph  BCx2 7/24 >>  ANTIBIOTICS     ASSESSMENT / PLAN:  Discussion:  [name] admitted on 7/23 with increased shortness.  On admit, he had elevated BNP and significant elevation of weight above dry weight (161lbs).  He has had frequent admissions in the last few months.  In addition to concerns for decompensated dCHF, he had RML consolidation with air bronchograms.  However, he did have any infectious symptoms (afebrile, normal WBC, no increase in baseline O2 needs, no cough or sputum production).  Suspect this is related to his positioning in bed > he typically lays to the right side.     RML Consolidation / Atelectasis Decompensated dCHF  Pulmonary Edema  Bilateral, R>L Pleural Effusions  Chronic Hypoxic Respiratory Failure  COPD III   Plan: Aggressive pulmonary hygiene - flutter valve Q2  hours (hopeful this will open up his RML 3% NS neb x48 hours to induce cough / secretion clearance Chest PT with vibra vest  Mucinex  Follow CXR intermittently  Diuresis as renal function / BP permit  If evidence of new infectious concerns, consider coverage for HCAP Trend PCT  O2 at baseline, titrate 88-92%   Attending to Follow.   Noe Gens, NP-C [facility] Pgr: [phone] or if no answer [phone] 08/27/2017, 2:02 PM  Attending Note:  67 year old male with an extensive PMH who presents to PCCM with a pleural effusion, RML atelectasis, SOB and hypoxemia.  On exam, decreased BS on the right but the patient is always laying on the bed favoring the  right had side.  I reviewed chest CT myself, pleural effusion noted with air bronchograms.  The patient is displaying no signs of infection however, afebrile, no purulent sputum production and no WBC.  Discussed with PCCM-NP.  Pleural effusion: dependent effusion and fluid overload related most likely given wt increase by 14 lbs.             - Diureses as able             - No thora             - If there are signs of him becoming toxic such as fever, WBC or vital instability will worry about an empyema and then perform a thora.  Atelectasis: body position dependent             - Mobilization is most important             - IS per RT protocol             - Flutter valve             - PT             - OOB  Hypoxemia: likely a combination of pulmonary edema and atelectasis             - Titrate O2 for a sat of 88-92%             - Atelectasis treatment as above             - Diureses as able  HCAP: I am not concerned about that at this point             - PCT, if elevated will reconsider  PCCM will sign off, please call back if needed.  Patient seen and examined, agree with above note.  I dictated the care and orders written for this patient under my direction.  Rush Farmer, [place]

## 2017-08-27 NOTE — Progress Notes (Addendum)
Family Medicine Teaching Service Daily Progress Note Intern Pager: [phone]  [name] Medical record number: [id number] Date of birth: Mar 05, 1950 Age: 67 y.o. Gender: male  Primary Care Provider: Kathrene Alu, MD Consultants: none Code Status: Full  Pt Overview and Major Events to Date:  7/23 admitted for CHF  Assessment and Plan: Leonard [name] is a 67 y.o. male presenting with reported O2 desaturation per SNF. PMH is significant for COPD, CKD Stage 4, CAD s/p CABG, T2DM, HTN, Hypothyroidism, and HLD.  CHF Exacerbation: Acute on chronic UOP 2000cc on 7/25. Receiving IV Lasix 120mg  BID. Also received metolazone 5mg  once yesterday.  Per cards, may need RHC in the future to confirm pulmonary HTN.  Will also receive another dose of metolazone today.  Pulmonology recommends increase in patient's activity, flutter valve q2hrs, induction of cough and secretion clearance with 3% NS nebs x 48hrs, Chest PT, mucinex, and following CXR's intermittently. Doubt pneumonia due to lack of fever and WBC is WNL. Procalcitonin 0.15. Patient states that his breathing is improved today.  Denies chest pain.  Crackles basilar to midlung on right and basilar on left. Trace pitting edema right foot, no edema of right leg. Nurse had reported desaturation overnight, increasing the patient's O2 requirement to 4L, there is no documentation of a desaturation in the chart. - continue O2 therapy, goal O2 sats 88-92%, baseline 2L - cont continuous pulse ox - cont chest PT, flutter valve, 3% NS nebs, mucinex  - will repeat CXR prn for worsening SOB, fever, increased WBC - f/u repeat blood culture - NGTD - diuresis per Heart failure team - f/u Heart failure recs, appreciate consultation - f/u PT/OT  AKI on Chronic Kidney Disease, Stage 4: BL ~1.6-1.9, 2.49 this AM, improving. - on IV Lasix 120mg  BID per cards - consider Nephro consult if Cr worsens - patient has fistula in LUE  COPD -  Stable No wheezing on exam.  O2 sats 100% on 4L.   -O2 goal 88-92%, spoke with nurse and asked her to titrate O2 accordingly - on continuous pulse ox - cont duoneb q6h prn  Anemia: Chronic, stable Hgb this AM 7.4. Cardiology recommends transfusion threshold of <7. Iron 37, rest of anemia panel WNL. - IV iron per cards - Monitor CBC  Hypertension: Chronic, controlled 151/59 this AM.      -hold torsemide and amlodipine - start hydralazine 25mg  TID, per cards, will plan to titrate up to home 100mg  QID as able - cont IV Lasix per cards - cont coreg 12.5 BID - cont home imdur - cont home clonidine - cont to hold doxazosin - continue to monitor BP  T2DM: Controlled CBG 139 this AM. -sSSI -monitor CBG AC/HS  HLD: Chronic On Lipitor 40mg . - Cont home lipitor  Hypothyroidism: Chronic, stable Last TSH 3.338 on 07/10/2017.  On Synthroid 21mcg at home. -Cont home synthroid  Hx DVT, PAD On Eliquis in the past, discontinued at recent hospitalization.  On ASA 81 QD. - Cont home ASA  Hx Left Leg AKA: Stable - no intervention needed - PT/OT while inpatient  Gout: Chronic, no acute flare On allopurinol at home - cont to hold allopurinol due to Cr increase  BPH: Chronic On doxazosin at home. -hold doxazosin due to low-normal BP  FEN/GI: Heart Healthy Prophylaxis: Lovenox  Disposition: pending clinical improvement will return to SNF  Subjective:  Patient states that his breathing is better today.  Denies chest pain.  Denies any other complaints.  Objective: Temp:  [98.1 F (36.7 C)-99.4 F (37.4 C)] 99.4 F (37.4 C) (07/26 0424) Pulse Rate:  [70-71] 70 (07/26 0424) Resp:  [16-20] 16 (07/26 0424) BP: (151-155)/(59-60) 151/59 (07/26 0424) SpO2:  [90 %-95 %] 94 % (07/26 0424) Weight:  [180 lb 5.4 oz (81.8 kg)] 180 lb 5.4 oz (81.8 kg) (07/26 0148)   Physical Exam: General: 67 y.o. y.o. male in NAD Cardio: RRR no m/r/g Lungs: Crackles in right lung base to  mid-lung, left lung basilar, no increased work of breathing, on 4L O2 Abdomen: Soft, non-tender to palpation, positive bowel sounds Skin: warm and dry Extremities: Trace edema right foot, no edema R leg.  L AKA. Psych: A&Ox3   Laboratory: Recent Labs  Lab 08/26/17 0417 08/27/17 0556 08/28/17 0540  WBC 3.7* 4.5 5.4  HGB 7.2* 7.4* 7.4*  HCT 23.5* 24.2* 24.2*  PLT 160 180 183   Recent Labs  Lab 08/26/17 0417 08/27/17 0556 08/28/17 0540  NA 140 140 140  K 4.5 4.3 4.2  CL 110 107 104  CO2 22 22 25   BUN 41* 42* 42*  CREATININE 3.00* 2.85* 2.49*  CALCIUM 8.4* 8.4* 8.5*  GLUCOSE 132* 165* 124*      Imaging/Diagnostic Tests: No results found.   Meccariello, Bernita Raisin, DO 08/28/2017, 8:36 AM PGY-1, [place] Intern pager: [phone], text pages welcome

## 2017-08-27 NOTE — Progress Notes (Addendum)
Family Medicine Teaching Service Daily Progress Note Intern Pager: [phone]  [name] Medical record number: [id number] Date of birth: March 12, 1950 Age: 67 y.o. Gender: male  Primary Care Provider: Kathrene Alu, MD Consultants: none Code Status: Full  Pt Overview and Major Events to Date:  7/23 admitted for CHF  Assessment and Plan: CADIN Leonard [name] is a 67 y.o. male presenting with reported O2 desaturation per SNF. PMH is significant for COPD, CKD Stage 4, CAD s/p CABG, T2DM, HTN, Hypothyroidism, and HLD.  CHF Exacerbation: Acute on Chronic, improving UOP on 7/24 900cc following one dose IV Lasix 120.  On IV Lasix 120mg  BID.  This AM crackles bibasilar to mid lung bilaterally.  No edema RLE. Blood culture yesterday possible contaminant per pharm. ID recommends repeat culture. CT on 7/24 showed large right and small to moderate left pleural effusions, focal consolidation with air bronchograms in the right middle lobe likely representing pneumonia, cardiac enlargement. - continue O2 therapy, goal O2 sats 88-92%, baseline 2L - ordered continuous pulse ox - consult pulmonology - f/u repeat blood culture - diuresis per Heart failure team - f/u Heart failure recs, appreciate consultation  AKI on Chronic Kidney Disease, Stage 4: BL ~1.6-1.9, Cr this AM 2.85, improved from 3.00 yesterday. - received IV Lasix 120 once yesterday per cards - consider Nephro consult if Cr worsens - patient has fistula in LUE  COPD - Stable No wheezing on exam.  On 2.5L O2, home O2 requirement 2L. - cont duoneb q6h prn  Anemia: Chronic, stable Hgb this AM  7.4 - Monitor CBC  Hypertension: Chronic, controlled BP 153/51 this AM.    -hold torsemide and amlodipine - cont IV Lasix per cards - cont coreg 12.5 BID - cont home imdur - cont home clonidine - cont to hold doxazosin - cont to hold hydralazine - continue to monitor BP  T2DM: Controlled CBG 150 this  AM. -sSSI -monitor CBG AC/HS  HLD: Chronic On Lipitor 40mg . - Cont home lipitor  Hypothyroidism: Chronic, stable Last TSH 3.338 on 07/10/2017.  On Synthroid 38mcg at home. -Cont home synthroid  Hx DVT, PAD On Eliquis in the past, discontinued at recent hospitalization.  On ASA 81 QD. - Conthome ASA  Hx Left Leg AKA: Stable - no intervention needed - PT/OT while inpatient  Gout: Chronic, no acute flare On allopurinol at home - cont to hold allopurinol due to Cr increase  BPH: Chronic On doxazosin at home. -hold doxazosin due to low-normal BP  FEN/GI: Heart Healthy Prophylaxis: Lovenox  Disposition: MedSurg for further diruesis  Subjective:  Patient states that he is feeling "alright" today.  Denies chest pain, denies SOB. No current complaints.  Objective: Temp:  [97.6 F (36.4 C)-98.8 F (37.1 C)] 97.6 F (36.4 C) (07/25 0520) Pulse Rate:  [63-70] 69 (07/25 0805) Resp:  [14-22] 14 (07/25 0805) BP: (122-152)/(41-59) 122/41 (07/25 0805) SpO2:  [90 %-94 %] 90 % (07/25 0520) Weight:  [184 lb 4.9 oz (83.6 kg)] 184 lb 4.9 oz (83.6 kg) (07/25 0520)  Physical Exam: General: 67 y.o. y.o. male in NAD Cardio: RRR no m/r/g Lungs: Crackles basilar to mid-lung bilaterally, no increased work of breathing, on 2.5L Abdomen: Soft, non-tender to palpation, positive bowel sounds Skin: warm and dry Extremities: No edema RLE.  L AKA. Psych: A&Ox3   Laboratory: Recent Labs  Lab 08/25/17 1002 08/26/17 0417 08/27/17 0556  WBC 7.7 3.7* 4.5  HGB 8.1* 7.2* 7.4*  HCT 26.6* 23.5* 24.2*  PLT  184 160 180   Recent Labs  Lab 08/25/17 1002 08/26/17 0417 08/27/17 0556  NA 140 140 140  K 4.7 4.5 4.3  CL 109 110 107  CO2 21* 22 22  BUN 38* 41* 42*  CREATININE 2.91* 3.00* 2.85*  CALCIUM 8.6* 8.4* 8.4*  GLUCOSE 150* 132* 165*      Imaging/Diagnostic Tests: Ct Chest Wo Contrast  Result Date: 08/26/2017 CLINICAL DATA:  Pleural effusion and shortness of breath.  Possible pneumonia. EXAM: CT CHEST WITHOUT CONTRAST TECHNIQUE: Multidetector CT imaging of the chest was performed following the standard protocol without IV contrast. COMPARISON:  Chest radiograph 08/25/2017.  CT chest 07/11/2017 FINDINGS: Cardiovascular: Diffuse cardiac enlargement. Coronary artery calcifications with postoperative changes consistent with coronary bypass. No pericardial effusion. Normal caliber thoracic aorta with calcification. Mediastinum/Nodes: Moderately prominent lymph nodes throughout the mediastinum and axillary regions are probably reactive. Esophagus is decompressed. Surgical clips. Lungs/Pleura: Large right and small to moderate left pleural effusions. Atelectasis in the lung bases. Focal consolidation with air bronchograms suggested in the right middle lung. This may reflect pneumonia. Hazy airspace and interstitial opacities likely to represent edema. No pneumothorax. Appearances are similar to previous study with interval development of the right middle lobe process. Airways are patent. Upper Abdomen: Limited visualization. No acute abnormality suggested. Musculoskeletal: Postoperative median sternotomy with sternotomy wires present. IMPRESSION: 1. Large right and small to moderate left pleural effusions. Atelectasis in the lung bases. Focal consolidation with air bronchograms in the right middle lobe likely representing pneumonia. Appearances are similar to previous study except for interval development of the right middle lobe process. 2. Diffuse airspace and interstitial infiltration in the remaining lungs likely indicates edema. 3. Cardiac enlargement. Aortic atherosclerosis. Postoperative coronary bypass. Aortic Atherosclerosis (ICD10-I70.0). Electronically Signed   By: Lucienne Capers M.D.   On: 08/26/2017 22:04     Rittberger, Bernita Raisin, DO 08/27/2017, 8:44 AM PGY-1, [place] Intern pager: [phone], text pages welcome

## 2017-08-27 NOTE — Progress Notes (Signed)
PT instructed on use of acapella by RT per MD order, Pt tolerated well.

## 2017-08-27 NOTE — Plan of Care (Signed)
  Problem: Nutrition: Goal: Adequate nutrition will be maintained Outcome: Completed/Met   Problem: Elimination: Goal: Will not experience complications related to bowel motility Outcome: Completed/Met   Problem: Pain Managment: Goal: General experience of comfort will improve Outcome: Completed/Met   Problem: Safety: Goal: Ability to remain free from injury will improve Outcome: Completed/Met   Problem: Cardiac: Goal: Ability to achieve and maintain adequate cardiopulmonary perfusion will improve Outcome: Completed/Met

## 2017-08-27 NOTE — Consult Note (Signed)
   [facility] Inpatient Consult   08/27/2017  Leonard [name] 12-25-1950 [id number]  Patient screened for multiple admissions and extreme high risk for unplanned readmissions.  Patient is recently long term care [name].  Spoke with inpatient CSW to confirm information as [facility] social worker had worked with case. Current plan is for the patient to return to LTC at post hospital. For questions contact:   Natividad Brood, RN BSN [facility] Liaison  [phone] business mobile phone Toll free office [phone]

## 2017-08-27 NOTE — Plan of Care (Signed)
  Problem: Activity: Goal: Risk for activity intolerance will decrease Outcome: Completed/Met   Problem: Nutrition: Goal: Adequate nutrition will be maintained Outcome: Completed/Met   Problem: Coping: Goal: Level of anxiety will decrease Outcome: Completed/Met   Problem: Elimination: Goal: Will not experience complications related to bowel motility Outcome: Completed/Met Goal: Will not experience complications related to urinary retention Outcome: Completed/Met   Problem: Pain Managment: Goal: General experience of comfort will improve Outcome: Completed/Met   Problem: Safety: Goal: Ability to remain free from injury will improve Outcome: Completed/Met   Problem: Skin Integrity: Goal: Risk for impaired skin integrity will decrease Outcome: Completed/Met   Problem: Education: Goal: Ability to demonstrate management of disease process will improve Outcome: Completed/Met   Problem: Activity: Goal: Capacity to carry out activities will improve Outcome: Completed/Met   Problem: Cardiac: Goal: Ability to achieve and maintain adequate cardiopulmonary perfusion will improve Outcome: Completed/Met

## 2017-08-27 NOTE — Progress Notes (Addendum)
Advanced Heart Failure Rounding Note  PCP-Cardiologist: No primary care provider on file.   Subjective:    Creatinine improving with 120 mg IV lasix x1. 3.0 > 2.85. I/Os about even, but improved UOP yesterday. Weight is up 3 lbs (bed weight). SBP 120-150s. Afebrile.   Hemoglobin 7.2 > 7.4.   SOB is the same. Denies CP, dizziness, or bleeding. No fever or chills.   Chest CT 7/24:  1. Large right and small to moderate left pleural effusions. Atelectasis in the lung bases. Focal consolidation with air bronchograms in the right middle lobe likely representing pneumonia. Appearances are similar to previous study except for interval development of the right middle lobe process.  2. Diffuse airspace and interstitial infiltration in the remaining lungs likely indicates edema.  3. Cardiac enlargement. Aortic atherosclerosis. Postoperative coronary bypass.  Objective:   Weight Range: 184 lb 4.9 oz (83.6 kg) Body mass index is 28.02 kg/m.   Vital Signs:   Temp:  [97.6 F (36.4 C)-98.8 F (37.1 C)] 97.6 F (36.4 C) (07/25 0520) Pulse Rate:  [63-70] 69 (07/25 0805) Resp:  [14-22] 14 (07/25 0805) BP: (122-152)/(41-59) 122/41 (07/25 0805) SpO2:  [90 %-94 %] 90 % (07/25 0520) Weight:  [184 lb 4.9 oz (83.6 kg)] 184 lb 4.9 oz (83.6 kg) (07/25 0520) Last BM Date: 08/26/17  Weight change: Filed Weights   08/25/17 1456 08/26/17 0439 08/27/17 0520  Weight: 181 lb 7 oz (82.3 kg) 181 lb 3.5 oz (82.2 kg) 184 lb 4.9 oz (83.6 kg)    Intake/Output:   Intake/Output Summary (Last 24 hours) at 08/27/2017 0832 Last data filed at 08/27/2017 0430 Gross per 24 hour  Intake 1017 ml  Output 1000 ml  Net 17 ml      Physical Exam    General:  Chronically ill appearing. SOB with talking.  HEENT: Normal Neck: Supple. JVP to jaw. Carotids 2+ bilat; no bruits. No lymphadenopathy or thyromegaly appreciated. Cor: PMI nondisplaced. Regular rate & rhythm. No rubs, gallops or murmurs. Lungs:  diminished in bases.  Abdomen: Soft, nontender, nondistended. No hepatosplenomegaly. No bruits or masses. Good bowel sounds. Extremities: No cyanosis, clubbing, rash, Left AKA. Trace RLE edema Neuro: Alert & orientedx3, cranial nerves grossly intact. moves all 4 extremities w/o difficulty. Affect pleasant   Telemetry   NSR 60-70s. Personally reviewed.   EKG    No new tracings.   Labs    CBC Recent Labs    08/26/17 0417 08/27/17 0556  WBC 3.7* 4.5  HGB 7.2* 7.4*  HCT 23.5* 24.2*  MCV 88.7 89.0  PLT 160 166   Basic Metabolic Panel Recent Labs    08/26/17 0417 08/27/17 0556  NA 140 140  K 4.5 4.3  CL 110 107  CO2 22 22  GLUCOSE 132* 165*  BUN 41* 42*  CREATININE 3.00* 2.85*  CALCIUM 8.4* 8.4*   Liver Function Tests No results for input(s): AST, ALT, ALKPHOS, BILITOT, PROT, ALBUMIN in the last 72 hours. No results for input(s): LIPASE, AMYLASE in the last 72 hours. Cardiac Enzymes Recent Labs    08/25/17 1002  TROPONINI <0.03    BNP: BNP (last 3 results) Recent Labs    07/22/17 1044 08/13/17 1050 08/25/17 1001  BNP 372.8* 334.1* 526.9*    ProBNP (last 3 results) No results for input(s): PROBNP in the last 8760 hours.   D-Dimer No results for input(s): DDIMER in the last 72 hours. Hemoglobin A1C No results for input(s): HGBA1C in the last 72 hours.  Fasting Lipid Panel No results for input(s): CHOL, HDL, LDLCALC, TRIG, CHOLHDL, LDLDIRECT in the last 72 hours. Thyroid Function Tests No results for input(s): TSH, T4TOTAL, T3FREE, THYROIDAB in the last 72 hours.  Invalid input(s): FREET3  Other results:   Imaging    Ct Chest Wo Contrast  Result Date: 08/26/2017 CLINICAL DATA:  Pleural effusion and shortness of breath. Possible pneumonia. EXAM: CT CHEST WITHOUT CONTRAST TECHNIQUE: Multidetector CT imaging of the chest was performed following the standard protocol without IV contrast. COMPARISON:  Chest radiograph 08/25/2017.  CT chest  07/11/2017 FINDINGS: Cardiovascular: Diffuse cardiac enlargement. Coronary artery calcifications with postoperative changes consistent with coronary bypass. No pericardial effusion. Normal caliber thoracic aorta with calcification. Mediastinum/Nodes: Moderately prominent lymph nodes throughout the mediastinum and axillary regions are probably reactive. Esophagus is decompressed. Surgical clips. Lungs/Pleura: Large right and small to moderate left pleural effusions. Atelectasis in the lung bases. Focal consolidation with air bronchograms suggested in the right middle lung. This may reflect pneumonia. Hazy airspace and interstitial opacities likely to represent edema. No pneumothorax. Appearances are similar to previous study with interval development of the right middle lobe process. Airways are patent. Upper Abdomen: Limited visualization. No acute abnormality suggested. Musculoskeletal: Postoperative median sternotomy with sternotomy wires present. IMPRESSION: 1. Large right and small to moderate left pleural effusions. Atelectasis in the lung bases. Focal consolidation with air bronchograms in the right middle lobe likely representing pneumonia. Appearances are similar to previous study except for interval development of the right middle lobe process. 2. Diffuse airspace and interstitial infiltration in the remaining lungs likely indicates edema. 3. Cardiac enlargement. Aortic atherosclerosis. Postoperative coronary bypass. Aortic Atherosclerosis (ICD10-I70.0). Electronically Signed   By: Lucienne Capers M.D.   On: 08/26/2017 22:04      Medications:     Scheduled Medications: . aspirin EC  81 mg Oral Daily  . atorvastatin  40 mg Oral q1800  . carvedilol  12.5 mg Oral BID WC  . cloNIDine  0.2 mg Transdermal Weekly  . enoxaparin (LOVENOX) injection  30 mg Subcutaneous Q24H  . feeding supplement (ENSURE ENLIVE)  237 mL Oral TID BM  . ferrous sulfate  325 mg Oral Q1200  . insulin aspart  0-9 Units  Subcutaneous TID WC  . isosorbide mononitrate  60 mg Oral QHS  . levothyroxine  75 mcg Oral QAC breakfast  . mouth rinse  15 mL Mouth Rinse BID  . sodium chloride flush  3 mL Intravenous Q12H     Infusions: . sodium chloride       PRN Medications:  sodium chloride, acetaminophen, ipratropium-albuterol, nitroGLYCERIN, ondansetron (ZOFRAN) IV, polyethylene glycol, sodium chloride flush    Patient Profile   JAYCEE PELZER is a 67 y.o. male with history of CAD s/p CABG, PAD, chronic diastolic CHF, CKD stage IV, COPD on home oxygen, and HTN.  Admitted 04/23/20 with A/C diastolic CHF  Assessment/Plan   1. Acute on chronic diastolic CHF:  - Echo 05/11/2498 LVEF 65-70%, Grade 2 DD, Mild TR, PA peak pressure 46 mm/Hg - Echo 07/20/17 Repeated limited echo to re-assess RV and PA pressure. PA peak pressure 63 mm Hg. RV OK.  - Volume status remains elevated - Continue lasix 120 mg BID.   2. ARF on CKD V - He has a LUE AV fistula. Would involve renal if cr worsens further.  - Creatinine trending down today. 3.0 > 2.85. Monitor closely  3. Pulmonary HTN - PA peak pressure 63 mmHg on Echo 07/20/17 - Suspect primarily  WHO Group III and WHO Group II pulmonary HTN. Suspect he has mild/mod Pulm Hypertension at baseline and pressures were elevated in the setting of volume overload.  - May need RHC to confirm, especially if diuresis limited by ARF. No change.   4. CAD s.p CABG 2015 - No s/s ischemia.    - Does not appear to have had ischemic work up since.  - Continue ASA and statin.   5. Chronic resp failure on home O2 in setting of COPD: Gold stage III - Per primary. Has had multiple admission over past 2 months.  - Continue Home O2. No change.   6. h/o DVT - Diagnosed 08/30/16. Now off anticoagulation.  No change.   7. DM - Hgb A1C 6.9 07/05/2017. Has been under better control lately.  - Per Primary. No change.   8. Extensive PAD - s/p L AKA for extensive LLE wounds with  no re-vascularization options in 03/2017 by Dr. Donzetta Matters. No change.   9. + Blood Cultures - Suspected contamination with S. Epidermidis. Not on ABX - Repeat BCX still pending. Remains afebrile.   10. Anemia - Hemoglobin 7.2 > 7.4 this am. Transfusion may help with symptoms. Defer to primary team.   Medication concerns reviewed with patient and pharmacy team. Barriers identified: None at this time. Pt in SNF.   Length of Stay: [place], NP  08/27/2017, 8:32 AM  Advanced Heart Failure Team Pager [phone] (M-F; 7a - 4p)  Please contact [facility] for night-coverage after hours (4p -7a ) and weekends on [email or web address]  Patient seen with NP, agree with the above note.    He was admitted from SNF with CHF exacerbation and AKI, creatinine up to 3 from baseline 1.8-1.9.   He is on Lasix 120 mg IV bid but has diuresed poorly.  Creatinine is lower today at 2.85.  CT chest showed bilateral pleural effusions, large on the right, and ?RML PNA.  He is afebrile with normal WBCs.  Hgb low at 7.4.   On exam, JVP remains 14-16 cm. 1+ right ankle edema, s/p L AKA.  Decreased breath sounds at bases.  Regular S1S2.   1.  Acute on chronic diastolic CHF: Echo 5/36 with EF 65-70%, PASP 63 mmHg, RV ok.  Suspect diastolic CHF with group 2/3 pulmonary hypertension.  On exam, he is still volume overloaded.  This is complicated by CKD stage IV and he has diuresed poorly so far.  - Continue Lasix 120 mg IV bid, will give a dose of metolazone 5 mg x 1 today.   2. AKI on CKD stage 4: He has a fistula already.  As above, he is clearly volume overloaded and short of breath so will continue to attempt to diurese him.  3. Pulmonary HTN: Noted by echo.  Suspect combined group 2 (pulmonary venous hypertension) and group 3 (severe COPD) PH.  No indication for selective pulmonary vasodilators.  4. PAD: s/p left AKA.  Follows with [facility].  5. ID: [id number] on blood cultures, suspect contaminant, cultures re-sent.  He does  have ?RML PNA on CT chest but no fever or leukocytosis.  No abx for now.  6. CAD: s/p CABG 2015.  No chest pain.  - Continue statin, ASA 81.  7. Hypertension: BP stable on home regimen.  8. COPD: Severe obstruction on prior PFTs.  Uses home oxygen.  9. Anemia: Hgb 7.4, transfuse < 7.  Send Fe studies, may benefit from IV Fe.   Shahidah Nesbitt [employer]  08/27/2017 3:29 PM

## 2017-08-27 NOTE — Plan of Care (Signed)
  Problem: Activity: Goal: Risk for activity intolerance will decrease Outcome: Completed/Met   Problem: Nutrition: Goal: Adequate nutrition will be maintained Outcome: Completed/Met   Problem: Coping: Goal: Level of anxiety will decrease Outcome: Completed/Met   Problem: Elimination: Goal: Will not experience complications related to bowel motility Outcome: Completed/Met Goal: Will not experience complications related to urinary retention Outcome: Completed/Met   Problem: Pain Managment: Goal: General experience of comfort will improve Outcome: Completed/Met   Problem: Safety: Goal: Ability to remain free from injury will improve Outcome: Completed/Met   Problem: Skin Integrity: Goal: Risk for impaired skin integrity will decrease Outcome: Completed/Met   Problem: Activity: Goal: Capacity to carry out activities will improve Outcome: Completed/Met   Problem: Cardiac: Goal: Ability to achieve and maintain adequate cardiopulmonary perfusion will improve Outcome: Completed/Met

## 2017-08-28 DIAGNOSIS — D631 Anemia in chronic kidney disease: Secondary | ICD-10-CM | POA: Diagnosis not present

## 2017-08-28 DIAGNOSIS — N189 Chronic kidney disease, unspecified: Secondary | ICD-10-CM | POA: Diagnosis not present

## 2017-08-28 DIAGNOSIS — I2510 Atherosclerotic heart disease of native coronary artery without angina pectoris: Secondary | ICD-10-CM

## 2017-08-28 DIAGNOSIS — N185 Chronic kidney disease, stage 5: Secondary | ICD-10-CM | POA: Diagnosis not present

## 2017-08-28 DIAGNOSIS — I132 Hypertensive heart and chronic kidney disease with heart failure and with stage 5 chronic kidney disease, or end stage renal disease: Secondary | ICD-10-CM | POA: Diagnosis not present

## 2017-08-28 DIAGNOSIS — J90 Pleural effusion, not elsewhere classified: Secondary | ICD-10-CM | POA: Diagnosis not present

## 2017-08-28 DIAGNOSIS — R0602 Shortness of breath: Secondary | ICD-10-CM | POA: Diagnosis not present

## 2017-08-28 DIAGNOSIS — J449 Chronic obstructive pulmonary disease, unspecified: Secondary | ICD-10-CM | POA: Diagnosis not present

## 2017-08-28 DIAGNOSIS — I5033 Acute on chronic diastolic (congestive) heart failure: Secondary | ICD-10-CM | POA: Diagnosis not present

## 2017-08-28 DIAGNOSIS — J440 Chronic obstructive pulmonary disease with acute lower respiratory infection: Secondary | ICD-10-CM | POA: Diagnosis not present

## 2017-08-28 DIAGNOSIS — J181 Lobar pneumonia, unspecified organism: Secondary | ICD-10-CM | POA: Diagnosis not present

## 2017-08-28 DIAGNOSIS — N179 Acute kidney failure, unspecified: Secondary | ICD-10-CM | POA: Diagnosis not present

## 2017-08-28 DIAGNOSIS — N184 Chronic kidney disease, stage 4 (severe): Secondary | ICD-10-CM | POA: Diagnosis not present

## 2017-08-28 LAB — CULTURE, BLOOD (SINGLE): Special Requests: ADEQUATE

## 2017-08-28 LAB — CBC
HCT: 24.2 % — ABNORMAL LOW (ref 39.0–52.0)
HEMOGLOBIN: 7.4 g/dL — AB (ref 13.0–17.0)
MCH: 27 pg (ref 26.0–34.0)
MCHC: 30.6 g/dL (ref 30.0–36.0)
MCV: 88.3 fL (ref 78.0–100.0)
PLATELETS: 183 10*3/uL (ref 150–400)
RBC: 2.74 MIL/uL — AB (ref 4.22–5.81)
RDW: 16.7 % — ABNORMAL HIGH (ref 11.5–15.5)
WBC: 5.4 10*3/uL (ref 4.0–10.5)

## 2017-08-28 LAB — BASIC METABOLIC PANEL
ANION GAP: 11 (ref 5–15)
BUN: 42 mg/dL — ABNORMAL HIGH (ref 8–23)
CHLORIDE: 104 mmol/L (ref 98–111)
CO2: 25 mmol/L (ref 22–32)
Calcium: 8.5 mg/dL — ABNORMAL LOW (ref 8.9–10.3)
Creatinine, Ser: 2.49 mg/dL — ABNORMAL HIGH (ref 0.61–1.24)
GFR calc non Af Amer: 25 mL/min — ABNORMAL LOW (ref >60)
GFR, EST AFRICAN AMERICAN: 29 mL/min — AB (ref >60)
Glucose, Bld: 124 mg/dL — ABNORMAL HIGH (ref 70–99)
POTASSIUM: 4.2 mmol/L (ref 3.5–5.1)
SODIUM: 140 mmol/L (ref 135–145)

## 2017-08-28 LAB — RETICULOCYTES
RBC.: 2.74 MIL/uL — ABNORMAL LOW (ref 4.22–5.81)
RETIC COUNT ABSOLUTE: 52.1 10*3/uL (ref 19.0–186.0)
Retic Ct Pct: 1.9 % (ref 0.4–3.1)

## 2017-08-28 LAB — GLUCOSE, CAPILLARY
GLUCOSE-CAPILLARY: 165 mg/dL — AB (ref 70–99)
Glucose-Capillary: 139 mg/dL — ABNORMAL HIGH (ref 70–99)
Glucose-Capillary: 168 mg/dL — ABNORMAL HIGH (ref 70–99)
Glucose-Capillary: 165 mg/dL — ABNORMAL HIGH (ref 70–99)

## 2017-08-28 MED ORDER — HYDRALAZINE HCL 25 MG PO TABS
25.0000 mg | ORAL_TABLET | Freq: Three times a day (TID) | ORAL | Status: DC
  Administered 2017-08-28 – 2017-08-29 (×4): 25 mg via ORAL
  Filled 2017-08-28 (×4): qty 1

## 2017-08-28 MED ORDER — METOLAZONE 5 MG PO TABS
5.0000 mg | ORAL_TABLET | Freq: Once | ORAL | Status: AC
  Administered 2017-08-28: 5 mg via ORAL
  Filled 2017-08-28: qty 1

## 2017-08-28 MED ORDER — FERUMOXYTOL INJECTION 510 MG/17 ML
510.0000 mg | Freq: Once | INTRAVENOUS | Status: AC
Start: 2017-08-28 — End: 2017-08-28
  Administered 2017-08-28: 510 mg via INTRAVENOUS
  Filled 2017-08-28: qty 17

## 2017-08-28 NOTE — Progress Notes (Addendum)
Advanced Heart Failure Rounding Note  PCP-Cardiologist: No primary care provider on file.   Subjective:    Creatinine improving with diuresis. Now 2.49. 120 mg IV lasix BID + metolazone 5 mg yesterday. Improved UOP with -1.2 L out. Weight down 4 lbs.   Hemoglobin 7.4. Iron low at 37.  Denies CP, SOB, orthopnea, or dizziness.  Chest CT 7/24:  1. Large right and small to moderate left pleural effusions. Atelectasis in the lung bases. Focal consolidation with air bronchograms in the right middle lobe likely representing pneumonia. Appearances are similar to previous study except for interval development of the right middle lobe process.  2. Diffuse airspace and interstitial infiltration in the remaining lungs likely indicates edema.  3. Cardiac enlargement. Aortic atherosclerosis. Postoperative coronary bypass.  Objective:   Weight Range: 180 lb 5.4 oz (81.8 kg) Body mass index is 27.42 kg/m.   Vital Signs:   Temp:  [98.1 F (36.7 C)-99.4 F (37.4 C)] 99.4 F (37.4 C) (07/26 0424) Pulse Rate:  [69-71] 70 (07/26 0424) Resp:  [14-20] 16 (07/26 0424) BP: (122-155)/(41-60) 151/59 (07/26 0424) SpO2:  [90 %-95 %] 94 % (07/26 0424) Weight:  [180 lb 5.4 oz (81.8 kg)] 180 lb 5.4 oz (81.8 kg) (07/26 0148) Last BM Date: 08/26/17  Weight change: Filed Weights   08/26/17 0439 08/27/17 0520 08/28/17 0148  Weight: 181 lb 3.5 oz (82.2 kg) 184 lb 4.9 oz (83.6 kg) 180 lb 5.4 oz (81.8 kg)    Intake/Output:   Intake/Output Summary (Last 24 hours) at 08/28/2017 0741 Last data filed at 08/28/2017 0134 Gross per 24 hour  Intake 785 ml  Output 2000 ml  Net -1215 ml      Physical Exam    General: SOB with talking. Appears chronically ill.  HEENT: Normal Neck: Supple. JVP to jaw. Carotids 2+ bilat; no bruits. No thyromegaly or nodule noted. Cor: PMI nondisplaced. RRR, No M/G/R noted Lungs: diminished in bases.  Abdomen: Soft, non-tender, non-distended, no HSM. No bruits  or masses. +BS  Extremities: No cyanosis, clubbing, or rash. Left AKA. Trace RLE edema.  Neuro: Alert & orientedx3, cranial nerves grossly intact. moves all 4 extremities w/o difficulty. Affect pleasant  Telemetry   NSR 60-70s. Personally reviewed.   EKG    No new tracings.   Labs    CBC Recent Labs    08/27/17 0556 08/28/17 0540  WBC 4.5 5.4  HGB 7.4* 7.4*  HCT 24.2* 24.2*  MCV 89.0 88.3  PLT 180 841   Basic Metabolic Panel Recent Labs    08/27/17 0556 08/28/17 0540  NA 140 140  K 4.3 4.2  CL 107 104  CO2 22 25  GLUCOSE 165* 124*  BUN 42* 42*  CREATININE 2.85* 2.49*  CALCIUM 8.4* 8.5*   Liver Function Tests No results for input(s): AST, ALT, ALKPHOS, BILITOT, PROT, ALBUMIN in the last 72 hours. No results for input(s): LIPASE, AMYLASE in the last 72 hours. Cardiac Enzymes Recent Labs    08/25/17 1002  TROPONINI <0.03    BNP: BNP (last 3 results) Recent Labs    07/22/17 1044 08/13/17 1050 08/25/17 1001  BNP 372.8* 334.1* 526.9*    ProBNP (last 3 results) No results for input(s): PROBNP in the last 8760 hours.   D-Dimer No results for input(s): DDIMER in the last 72 hours. Hemoglobin A1C No results for input(s): HGBA1C in the last 72 hours. Fasting Lipid Panel No results for input(s): CHOL, HDL, LDLCALC, TRIG, CHOLHDL, LDLDIRECT in the  last 72 hours. Thyroid Function Tests No results for input(s): TSH, T4TOTAL, T3FREE, THYROIDAB in the last 72 hours.  Invalid input(s): FREET3  Other results:   Imaging    No results found.   Medications:     Scheduled Medications: . aspirin EC  81 mg Oral Daily  . atorvastatin  40 mg Oral q1800  . carvedilol  12.5 mg Oral BID WC  . cloNIDine  0.2 mg Transdermal Weekly  . enoxaparin (LOVENOX) injection  30 mg Subcutaneous Q24H  . feeding supplement (ENSURE ENLIVE)  237 mL Oral TID BM  . ferrous sulfate  325 mg Oral Q1200  . guaiFENesin  1,200 mg Oral BID  . insulin aspart  0-9 Units  Subcutaneous TID WC  . isosorbide mononitrate  60 mg Oral QHS  . levothyroxine  75 mcg Oral QAC breakfast  . mouth rinse  15 mL Mouth Rinse BID  . sodium chloride flush  3 mL Intravenous Q12H  . sodium chloride HYPERTONIC  4 mL Nebulization BID    Infusions: . sodium chloride 250 mL (08/27/17 1742)  . furosemide 120 mg (08/27/17 1743)    PRN Medications: sodium chloride, acetaminophen, ipratropium-albuterol, nitroGLYCERIN, ondansetron (ZOFRAN) IV, polyethylene glycol, sodium chloride flush    Patient Profile   Leonard [name] is a 67 y.o. male with history of CAD s/p CABG, PAD, chronic diastolic CHF, CKD stage IV, COPD on home oxygen, and HTN.  Admitted 7/82/95 with A/C diastolic CHF  Assessment/Plan   1. Acute on chronic diastolic CHF:  - Echo 07/06/1306 LVEF 65-70%, Grade 2 DD, Mild TR, PA peak pressure 46 mm/Hg - Echo 07/20/17 Repeated limited echo to re-assess RV and PA pressure. PA peak pressure 63 mm Hg. RV OK.  - Volume status remains elevated.  - Continue lasix 120 mg BID. May need another dose of metolazone today.  - Hold off on spiro with AKI - BP elevated 120-150s. Start hydralazine 25 mg TID (takes 100 mg TID at home). Continue imdur 60 mg daily  2. ARF on CKD V - He has a LUE AV fistula. Would involve renal if cr worsens further.  - Creatinine trending down today. 3.0 > 2.85 > 2.49. Monitor closely  3. Pulmonary HTN - PA peak pressure 63 mmHg on Echo 07/20/17 - Suspect primarily WHO Group III and WHO Group II pulmonary HTN. Suspect he has mild/mod Pulm Hypertension at baseline and pressures were elevated in the setting of volume overload.  - May need RHC to confirm, especially if diuresis limited by ARF. No change.   4. CAD s.p CABG 2015 - Does not appear to have had ischemic work up since.  - Continue ASA and statin.  - No s/s ischemia.     5. Chronic resp failure on home O2 in setting of COPD: Gold stage III - Per primary. Has had multiple admission  over past 2 months.  - Continue Home O2. No change. On 4 L now.   6. h/o DVT - Diagnosed 08/30/16. Now off anticoagulation.  No change.   7. DM - Hgb A1C 6.9 07/05/2017. Has been under better control lately.  - Per Primary. No change.   8. Extensive PAD - s/p L AKA for extensive LLE wounds with no re-vascularization options in 03/2017 by Dr. Donzetta Matters. No change.   9. + Blood Cultures - Suspected contamination with S. Epidermidis. Not on ABX - [facility]. Remains afebrile.   10. Anemia - Hemoglobin 7.2 > 7.4 > 7.4 this am. Iron  also low at 37.  - May give IV iron. Will discuss with pharmD.   Medication concerns reviewed with patient and pharmacy team. Barriers identified: None at this time. Pt in SNF.   Length of Stay: [place], NP  08/28/2017, 7:41 AM  Advanced Heart Failure Team Pager [phone] (M-F; 7a - 4p)  Please contact [facility] for night-coverage after hours (4p -7a ) and weekends on [email or web address]  Patient seen with NP, agree with the above note.   He was admitted from SNF with CHF exacerbation and AKI, creatinine up to 3 from baseline 1.8-1.9. He is on Lasix 120 mg IV bid and got a dose of metolazone 5 mg yesterday with better diuresis.  Creatinine is lower today at 2.49.  CT chest showed bilateral pleural effusions, large on the right, and ?RML PNA.  He is afebrile with normal WBCs and PCT only 0.15.  Hgb low at 7.4 but stable.   On exam, JVP remains elevated 14 cm. Trace right ankle edema, s/p L AKA. Decreased breath sounds at bases. Regular S1S2.   1. Acute on chronic diastolic CHF: Echo 2/35 with EF 65-70%, PASP 63 mmHg, RV ok. Suspect diastolic CHF with group 2/3 pulmonary hypertension. On exam, he is still volume overloaded.  This is complicated by CKD stage IV.  He diuresed better yesterday and creatinine down.  - Continue Lasix 120 mg IV bid, will give a dose of metolazone 5 mg x 1 again today.   2. AKI on CKD stage 4: He has a fistula already.  As above, he is clearly volume overloaded and short of breath so will continue to attempt to diurese him.  Creatinine better today at 2.49.  3. Pulmonary HTN: Noted by echo. Suspect combined group 2 (pulmonary venous hypertension) and group 3 (severe COPD) PH. No indication for selective pulmonary vasodilators.  4. PAD: s/p left AKA. Follows with [facility].  5. ID: MRSE on blood cultures, suspect contaminant, cultures re-sent.  He does have ?RML PNA on CT chest but no fever or leukocytosis.  PCT not significantly elevated (0.15).  No abx for now.  6. CAD: s/p CABG 2015. No chest pain.  - Continue statin, ASA 81.  7. Hypertension: BP running high, restart hydralazine at 25 mg tid and can titrate up.  8. COPD: Severe obstruction on prior PFTs. Uses home oxygen.  9. Anemia: Hgb 7.4, transfuse < 7.  IV Fe ordered. 10. Pleural effusions: Large on right, moderate on left.  Treat with diuresis for now, doubt parapneumonic/empyema.   Loralie Champagne 08/28/2017 8:39 AM

## 2017-08-28 NOTE — Evaluation (Signed)
Physical Therapy Evaluation Patient Details Name: Leonard [name] MRN: [id number] DOB: May 28, 1950 Today's Date: 08/28/2017   History of Present Illness   Pt is a 67 y/o male with PMH significant for COPD, CKD, CADs/p CABG, DM, LAKA and HTN admitted with CHF  Clinical Impression  Pt admitted with above diagnosis. Pt currently with functional limitations due to the deficits listed below (see PT Problem List). PTA, pt w/c bound, living at [facility]. Pt reports he does not stand or walk and is reliant for most ADLs. Upon eval pt presenting near baseline, decreased activity tolerance due to fatigue and unable to perform squat pivot. transfered back from chair into bed with mod A scooting laterally.  Pt will benefit from skilled PT to increase their independence and safety with mobility to allow discharge to the venue listed below.       Follow Up Recommendations SNF;Supervision/Assistance - 24 hour    Equipment Recommendations  None recommended by PT    Recommendations for Other Services       Precautions / Restrictions Precautions Precautions: Fall Restrictions Weight Bearing Restrictions: No Other Position/Activity Restrictions: L AKA      Mobility  Bed Mobility Overal bed mobility: Needs Assistance Bed Mobility: Rolling;Sidelying to Sit Rolling: Min assist Sidelying to sit: Min assist          Transfers Overall transfer level: Needs assistance Equipment used: None Transfers: Lateral/Scoot Transfers Sit to Stand: Total assist        Lateral/Scoot Transfers: Mod assist General transfer comment: unable to stand or squat pivot with max A and use of RW. lateral scooting with min guard intermittent mod A.   Ambulation/Gait             General Gait Details: unable at baseline  Stairs            Wheelchair Mobility    Modified Rankin (Stroke Patients Only)       Balance Overall balance assessment: Needs assistance Sitting-balance support: No upper extremity  supported;Feet supported Sitting balance-Leahy Scale: Fair                                       Pertinent Vitals/Pain Pain Assessment: Faces Faces Pain Scale: Hurts a little bit Pain Location: mid back Pain Descriptors / Indicators: Aching;Sore Pain Intervention(s): Limited activity within patient's tolerance;Monitored during session    [place] expects to be discharged to:: Skilled nursing facility                      Prior Function Level of Independence: Needs assistance   Gait / Transfers Assistance Needed: Pt reports he does not ambulate, only pivots with A; uses WC for mobility; pt reports he lounges around alot at facility  ADL's / Homemaking Assistance Needed: Pt reports he can do about half of his B/D but that staff at [facility] usally do it all for him  Comments: Per old chart: Pt's wife reports following amputation in February pt was sent home with little f/u from therapy and declined in mobility until he was recently requiring heavy assist     Hand Dominance   Dominant Hand: Right    Extremity/Trunk Assessment   Upper Extremity Assessment Upper Extremity Assessment: Defer to OT evaluation    Lower Extremity Assessment Lower Extremity Assessment: (L AKA, RLE strength 3-/5)       Communication   Communication:  No difficulties  Cognition Arousal/Alertness: Awake/alert Behavior During Therapy: Flat affect Overall Cognitive Status: No family/caregiver present to determine baseline cognitive functioning                                 General Comments: pt RN patient more lethargic in AM, more alert at night. PT following commands with increased time, unable to answer specific questions regarding mobility or PLOF. vauge at times      General Comments      Exercises     Assessment/Plan    PT Assessment Patient needs continued PT services  PT Problem List Decreased strength;Decreased range of  motion;Decreased activity tolerance;Decreased balance;Decreased mobility;Decreased coordination;Decreased cognition;Decreased knowledge of use of DME;Decreased safety awareness       PT Treatment Interventions DME instruction;Gait training;Functional mobility training;Therapeutic activities;Therapeutic exercise;Balance training;Patient/family education    PT Goals (Current goals can be found in the Care Plan section)  Acute Rehab PT Goals Patient Stated Goal: to go back to facility PT Goal Formulation: With patient Time For Goal Achievement: 09/04/17 Potential to Achieve Goals: Good    Frequency Min 2X/week   Barriers to discharge        Co-evaluation               AM-PAC PT "6 Clicks" Daily Activity  Outcome Measure Difficulty turning over in bed (including adjusting bedclothes, sheets and blankets)?: Unable Difficulty moving from lying on back to sitting on the side of the bed? : Unable Difficulty sitting down on and standing up from a chair with arms (e.g., wheelchair, bedside commode, etc,.)?: Unable Help needed moving to and from a bed to chair (including a wheelchair)?: Total Help needed walking in hospital room?: Total Help needed climbing 3-5 steps with a railing? : Total 6 Click Score: 6    End of Session Equipment Utilized During Treatment: Oxygen Activity Tolerance: Patient limited by lethargy;Patient limited by fatigue Patient left: in bed;with call bell/phone within reach;with bed alarm set Nurse Communication: Mobility status PT Visit Diagnosis: Muscle weakness (generalized) (M62.81);Difficulty in walking, not elsewhere classified (R26.2)    Time: 1130-1157 PT Time Calculation (min) (ACUTE ONLY): 27 min   Charges:   PT Evaluation $PT Eval Moderate Complexity: 1 Mod PT Treatments $Therapeutic Activity: 8-22 mins        Reinaldo Berber, PT, DPT Acute Rehab Services Pager: [phone]    Reinaldo Berber 08/28/2017, 12:08 PM

## 2017-08-28 NOTE — Evaluation (Addendum)
Occupational Therapy Evaluation and Discharge Patient Details Name: Leonard [name] MRN: [id number] DOB: Dec 04, 1950 Today's Date: 08/28/2017    History of Present Illness  Pt is a 67 y/o male with PMH significant for COPD, CKD, CADs/p CABG, DM, LAKA and HTN admitted with CHF   Clinical Impression   This 67 yo male admitted with above presents to acute OT with decreased mobility, increased pain, decreased transfers. He reports he needed A for transfers pta and staff A him with all of his basic ADLs. No further acute OT needs, we will defer to OT at SNF as they deem appropriate. Pt's O2 on 3 liters 91-93%. Acute OT will sign off.      Follow Up Recommendations  SNF;Supervision/Assistance - 24 hour    Equipment Recommendations  None recommended by OT       Precautions / Restrictions Precautions Precautions: Fall Restrictions Weight Bearing Restrictions: No      Mobility Bed Mobility Overal bed mobility: Needs Assistance Bed Mobility: Rolling;Sidelying to Sit Rolling: Supervision Sidelying to sit: Min assist          Transfers Overall transfer level: Needs assistance Equipment used: None Transfers: Lateral/Scoot Transfers          Lateral/Scoot Transfers: Mod assist General transfer comment: going to pt's right     Balance Overall balance assessment: Needs assistance Sitting-balance support: No upper extremity supported;Feet supported Sitting balance-Leahy Scale: Fair                                     ADL either performed or assessed with clinical judgement   ADL Overall ADL's : Needs assistance/impaired Eating/Feeding: Independent Eating/Feeding Details (indicate cue type and reason): supported sitting Grooming: Set up;Supervision/safety Grooming Details (indicate cue type and reason): supported sitting Upper Body Bathing: Set up;Supervision/ safety Upper Body Bathing Details (indicate cue type and reason): supported sitting Lower Body  Bathing: Moderate assistance Lower Body Bathing Details (indicate cue type and reason): supported sitting/lateral lean Upper Body Dressing : Minimal assistance Upper Body Dressing Details (indicate cue type and reason): supported sitting Lower Body Dressing: Maximal assistance Lower Body Dressing Details (indicate cue type and reason): supported sitting/lateral lean Toilet Transfer: Moderate assistance Toilet Transfer Details (indicate cue type and reason): lateral scoot to drop arm recliner going to pt's right Toileting- Clothing Manipulation and Hygiene: Total assistance Toileting - Clothing Manipulation Details (indicate cue type and reason): supported sitting/lateral lean             Vision Patient Visual Report: No change from baseline              Pertinent Vitals/Pain Pain Assessment: 0-10 Faces Pain Scale: Hurts little more Pain Location: mid back Pain Descriptors / Indicators: Aching;Sore Pain Intervention(s): Limited activity within patient's tolerance;Monitored during session;Heat applied     Hand Dominance Right   Extremity/Trunk Assessment Upper Extremity Assessment Upper Extremity Assessment: Overall WFL for tasks assessed           Communication Communication Communication: No difficulties   Cognition Arousal/Alertness: Awake/alert Behavior During Therapy: Flat affect Overall Cognitive Status: No family/caregiver present to determine baseline cognitive functioning                                 General Comments: Slow to move v. process what I was asking him to do  Home Living Family/patient expects to be discharged to:: Skilled nursing facility                                        Prior Functioning/Environment Level of Independence: Needs assistance  Gait / Transfers Assistance Needed: Pt reports he does not ambulate, only pivots with A; uses WC for mobility; pt reports he lounges around alot at  facility ADL's / Homemaking Assistance Needed: Pt reports he can do about half of his B/D but that staff at [facility] usally do it all for him   Comments: Per old chart: Pt's wife reports following amputation in February pt was sent home with little f/u from therapy and declined in mobility until he was recently requiring heavy assist        OT Problem List: Impaired balance (sitting and/or standing);Pain         OT Goals(Current goals can be found in the care plan section) Acute Rehab OT Goals Patient Stated Goal: to go back to facility  OT Frequency:                AM-PAC PT "6 Clicks" Daily Activity     Outcome Measure Help from another person eating meals?: None Help from another person taking care of personal grooming?: A Little Help from another person toileting, which includes using toliet, bedpan, or urinal?: A Lot Help from another person bathing (including washing, rinsing, drying)?: A Lot Help from another person to put on and taking off regular upper body clothing?: A Little Help from another person to put on and taking off regular lower body clothing?: A Lot 6 Click Score: 16   End of Session Nurse Communication: (nurse tech)  Activity Tolerance: (pt was tired and fatigued post transfer) Patient left: in chair;with call bell/phone within reach;with chair alarm set  OT Visit Diagnosis: Other abnormalities of gait and mobility (R26.89);Muscle weakness (generalized) (M62.81);Pain Pain - part of body: (mid back)                Time: 2694-8546 OT Time Calculation (min): 48 min Charges:  OT General Charges $OT Visit: 1 Visit OT Evaluation $OT Eval Moderate Complexity: 1 Mod OT Treatments $Self Care/Home Management : 23-37 mins 08/28/2017  [address], OTR/L [phone] 08/28/2017

## 2017-08-29 DIAGNOSIS — I5033 Acute on chronic diastolic (congestive) heart failure: Secondary | ICD-10-CM | POA: Diagnosis not present

## 2017-08-29 DIAGNOSIS — N189 Chronic kidney disease, unspecified: Secondary | ICD-10-CM | POA: Diagnosis not present

## 2017-08-29 DIAGNOSIS — J440 Chronic obstructive pulmonary disease with acute lower respiratory infection: Secondary | ICD-10-CM | POA: Diagnosis not present

## 2017-08-29 DIAGNOSIS — N185 Chronic kidney disease, stage 5: Secondary | ICD-10-CM | POA: Diagnosis not present

## 2017-08-29 DIAGNOSIS — I132 Hypertensive heart and chronic kidney disease with heart failure and with stage 5 chronic kidney disease, or end stage renal disease: Secondary | ICD-10-CM | POA: Diagnosis not present

## 2017-08-29 DIAGNOSIS — J181 Lobar pneumonia, unspecified organism: Secondary | ICD-10-CM | POA: Diagnosis not present

## 2017-08-29 DIAGNOSIS — N179 Acute kidney failure, unspecified: Secondary | ICD-10-CM | POA: Diagnosis not present

## 2017-08-29 DIAGNOSIS — D631 Anemia in chronic kidney disease: Secondary | ICD-10-CM | POA: Diagnosis not present

## 2017-08-29 DIAGNOSIS — R0602 Shortness of breath: Secondary | ICD-10-CM | POA: Diagnosis not present

## 2017-08-29 DIAGNOSIS — E1142 Type 2 diabetes mellitus with diabetic polyneuropathy: Secondary | ICD-10-CM | POA: Diagnosis not present

## 2017-08-29 DIAGNOSIS — I2510 Atherosclerotic heart disease of native coronary artery without angina pectoris: Secondary | ICD-10-CM | POA: Diagnosis not present

## 2017-08-29 DIAGNOSIS — J449 Chronic obstructive pulmonary disease, unspecified: Secondary | ICD-10-CM | POA: Diagnosis not present

## 2017-08-29 LAB — CBC
HCT: 24.1 % — ABNORMAL LOW (ref 39.0–52.0)
HEMOGLOBIN: 7.3 g/dL — AB (ref 13.0–17.0)
MCH: 26.4 pg (ref 26.0–34.0)
MCHC: 30.3 g/dL (ref 30.0–36.0)
MCV: 87.3 fL (ref 78.0–100.0)
Platelets: 182 10*3/uL (ref 150–400)
RBC: 2.76 MIL/uL — ABNORMAL LOW (ref 4.22–5.81)
RDW: 16.5 % — ABNORMAL HIGH (ref 11.5–15.5)
WBC: 6.4 10*3/uL (ref 4.0–10.5)

## 2017-08-29 LAB — GLUCOSE, CAPILLARY
GLUCOSE-CAPILLARY: 130 mg/dL — AB (ref 70–99)
GLUCOSE-CAPILLARY: 115 mg/dL — AB (ref 70–99)
Glucose-Capillary: 210 mg/dL — ABNORMAL HIGH (ref 70–99)
Glucose-Capillary: 253 mg/dL — ABNORMAL HIGH (ref 70–99)

## 2017-08-29 LAB — BASIC METABOLIC PANEL
ANION GAP: 12 (ref 5–15)
BUN: 43 mg/dL — ABNORMAL HIGH (ref 8–23)
CALCIUM: 8.5 mg/dL — AB (ref 8.9–10.3)
CO2: 25 mmol/L (ref 22–32)
Chloride: 99 mmol/L (ref 98–111)
Creatinine, Ser: 2.02 mg/dL — ABNORMAL HIGH (ref 0.61–1.24)
GFR, EST AFRICAN AMERICAN: 38 mL/min — AB (ref >60)
GFR, EST NON AFRICAN AMERICAN: 33 mL/min — AB (ref >60)
Glucose, Bld: 186 mg/dL — ABNORMAL HIGH (ref 70–99)
Potassium: 4.1 mmol/L (ref 3.5–5.1)
Sodium: 136 mmol/L (ref 135–145)

## 2017-08-29 MED ORDER — HYDRALAZINE HCL 50 MG PO TABS
50.0000 mg | ORAL_TABLET | Freq: Three times a day (TID) | ORAL | Status: DC
  Administered 2017-08-29 – 2017-08-30 (×3): 50 mg via ORAL
  Filled 2017-08-29 (×3): qty 1

## 2017-08-29 MED ORDER — DARBEPOETIN ALFA 40 MCG/0.4ML IJ SOSY
40.0000 ug | PREFILLED_SYRINGE | INTRAMUSCULAR | Status: DC
  Administered 2017-08-29: 40 ug via SUBCUTANEOUS
  Filled 2017-08-29: qty 0.4

## 2017-08-29 NOTE — Progress Notes (Signed)
Advanced Heart Failure Rounding Note  PCP-Cardiologist: No primary care provider on file.   Subjective:    Continues on IV diuretics and metolazone. Weight down another 9 pounds overnight (15 pounds total). Creatinine improving 2.5-> 2.0   Not very interactive but says he feels ok. Denies SOB, orthopnea or PND.    Chest CT 7/24:  1. Large right and small to moderate left pleural effusions. Atelectasis in the lung bases. Focal consolidation with air bronchograms in the right middle lobe likely representing pneumonia. Appearances are similar to previous study except for interval development of the right middle lobe process.  2. Diffuse airspace and interstitial infiltration in the remaining lungs likely indicates edema.  3. Cardiac enlargement. Aortic atherosclerosis. Postoperative coronary bypass.  Objective:   Weight Range: 78 kg (171 lb 15.3 oz) Body mass index is 26.15 kg/m.   Vital Signs:   Temp:  [98.9 F (37.2 C)-99.5 F (37.5 C)] 98.9 F (37.2 C) (07/27 1212) Pulse Rate:  [71-75] 71 (07/27 1212) Resp:  [16-20] 20 (07/27 1212) BP: (130-156)/(49-60) 144/53 (07/27 1212) SpO2:  [91 %-97 %] 94 % (07/27 1212) Weight:  [78 kg (171 lb 15.3 oz)] 78 kg (171 lb 15.3 oz) (07/27 0558) Last BM Date: 08/28/17  Weight change: Filed Weights   08/27/17 0520 08/28/17 0148 08/29/17 0558  Weight: 83.6 kg (184 lb 4.9 oz) 81.8 kg (180 lb 5.4 oz) 78 kg (171 lb 15.3 oz)    Intake/Output:   Intake/Output Summary (Last 24 hours) at 08/29/2017 1456 Last data filed at 08/29/2017 1437 Gross per 24 hour  Intake 603 ml  Output 4700 ml  Net -4097 ml      Physical Exam    General:  Lying in bed. Drowsy but arouses and is conversant. No resp difficulty HEENT: normal Neck: supple. JVP 10. Carotids 2+ bilat; no bruits. No lymphadenopathy or thryomegaly appreciated. Cor: PMI nondisplaced. Regular rate & rhythm. No rubs, gallops or murmurs. Lungs: clear Abdomen: soft,  nontender, nondistended. No hepatosplenomegaly. No bruits or masses. Good bowel sounds. Extremities: no cyanosis, clubbing, rash s/p L AKA no edema. LUE AVF  Foley with clear urine  Neuro: alert & orientedx3, cranial nerves grossly intact. moves all 4 extremities w/o difficulty. Affect flat    Telemetry   NSR 60-70s Personally reviewed.   EKG    No new tracings.   Labs    CBC Recent Labs    08/28/17 0540 08/29/17 0418  WBC 5.4 6.4  HGB 7.4* 7.3*  HCT 24.2* 24.1*  MCV 88.3 87.3  PLT 183 119   Basic Metabolic Panel Recent Labs    08/28/17 0540 08/29/17 0418  NA 140 136  K 4.2 4.1  CL 104 99  CO2 25 25  GLUCOSE 124* 186*  BUN 42* 43*  CREATININE 2.49* 2.02*  CALCIUM 8.5* 8.5*   Liver Function Tests No results for input(s): AST, ALT, ALKPHOS, BILITOT, PROT, ALBUMIN in the last 72 hours. No results for input(s): LIPASE, AMYLASE in the last 72 hours. Cardiac Enzymes No results for input(s): CKTOTAL, CKMB, CKMBINDEX, TROPONINI in the last 72 hours.  BNP: BNP (last 3 results) Recent Labs    07/22/17 1044 08/13/17 1050 08/25/17 1001  BNP 372.8* 334.1* 526.9*    ProBNP (last 3 results) No results for input(s): PROBNP in the last 8760 hours.   D-Dimer No results for input(s): DDIMER in the last 72 hours. Hemoglobin A1C No results for input(s): HGBA1C in the last 72 hours. Fasting Lipid Panel  No results for input(s): CHOL, HDL, LDLCALC, TRIG, CHOLHDL, LDLDIRECT in the last 72 hours. Thyroid Function Tests No results for input(s): TSH, T4TOTAL, T3FREE, THYROIDAB in the last 72 hours.  Invalid input(s): FREET3  Other results:   Imaging    No results found.   Medications:     Scheduled Medications: . aspirin EC  81 mg Oral Daily  . atorvastatin  40 mg Oral q1800  . carvedilol  12.5 mg Oral BID WC  . cloNIDine  0.2 mg Transdermal Weekly  . enoxaparin (LOVENOX) injection  30 mg Subcutaneous Q24H  . feeding supplement (ENSURE ENLIVE)  237 mL Oral  TID BM  . ferrous sulfate  325 mg Oral Q1200  . guaiFENesin  1,200 mg Oral BID  . hydrALAZINE  50 mg Oral Q8H  . insulin aspart  0-9 Units Subcutaneous TID WC  . isosorbide mononitrate  60 mg Oral QHS  . levothyroxine  75 mcg Oral QAC breakfast  . mouth rinse  15 mL Mouth Rinse BID  . sodium chloride flush  3 mL Intravenous Q12H  . sodium chloride HYPERTONIC  4 mL Nebulization BID    Infusions: . sodium chloride 250 mL (08/29/17 0820)  . furosemide 120 mg (08/29/17 0822)    PRN Medications: sodium chloride, acetaminophen, ipratropium-albuterol, nitroGLYCERIN, ondansetron (ZOFRAN) IV, polyethylene glycol, sodium chloride flush    Patient Profile   Leonard [name] is a 67 y.o. male with history of CAD s/p CABG, PAD, chronic diastolic CHF, CKD stage IV, COPD on home oxygen, and HTN.  Admitted 8/50/27 with A/C diastolic CHF  Assessment/Plan   1. Acute on chronic diastolic CHF:  - Echo 08/06/1285 LVEF 65-70%, Grade 2 DD, Mild TR, PA peak pressure 46 mm/Hg - Echo 07/20/17 Repeated limited echo to re-assess RV and PA pressure. PA peak pressure 63 mm Hg. RV OK.  - Volume status improving on IV lasix and metoalzone with brisk diuresis overnight. Still with mild volume overload. Will continue lasix for one more day then switch to po. No metolazone  - Hold off on spiro with AKI - BP elevated 120-150s. Increase hydralazine 50 mg TID (takes 100 mg TID at home). Continue imdur 60 mg daily  2. ARF on CKD V - Creatinine continues to improve with diuresis . 3.0 > 2.85 > 2.49 > 2.0. Monitor closely - He has a LUE AV fistula. Would involve renal if cr worsens further.   3. Pulmonary HTN - PA peak pressure 63 mmHg on Echo 07/20/17 - Suspect primarily WHO Group III and WHO Group II pulmonary HTN. Suspect he has mild/mod Pulm Hypertension at baseline and pressures were elevated in the setting of volume overload.   4. CAD s.p CABG 2015 - no s/s ischemia - Continue ASA and statin.   5.  Chronic resp failure on home O2 in setting of COPD: Gold stage III - Per primary. - Severe obstruction on prior PFTs. Uses home oxygen.  - Has had multiple admission over past 2 months.  - Continue Home O2. No change. On 4 L now.   6. h/o DVT - Diagnosed 08/30/16. Now off anticoagulation.  No change.   7. DM - Hgb A1C 6.9 07/05/2017. Has been under better control lately.  - Per Primary. No change.   8. Extensive PAD - s/p L AKA for extensive LLE wounds with no re-vascularization options in 03/2017 by Dr. Donzetta Matters. No change.   9. + Blood Cultures - Suspected contamination with S. Epidermidis. Not on ABX -  [place]. Remains afebrile.   10. Anemia - Hemoglobin 7.2 > 7.4 > 7.4 >7.3  this am. Iron also low at 37.  - Received Feraheme 7/26 - Start Aranesp. Discussed dosing with PharmD personally. - Will leave management to primary team. Consider RBC transfusion as needed  11. Pleural effusions: Large on right, moderate on left.  Treat with diuresis for now, doubt parapneumonic/empyema. - repeat CXR in am   54. HTN - SBP improving with diuresis. Now 140-150 range. Increase hydralazine to 50 tid  Medication concerns reviewed with patient and pharmacy team. Barriers identified: None at this time. Pt in SNF.   Length of Stay: [place], MD  08/29/2017, 2:56 PM  Advanced Heart Failure Team Pager [phone] (M-F; 7a - 4p)  Please contact [facility] for night-coverage after hours (4p -7a ) and weekends on [email or web address]

## 2017-08-29 NOTE — Plan of Care (Signed)
  Problem: Clinical Measurements: Goal: Ability to maintain clinical measurements within normal limits will improve Outcome: Progressing   Problem: Clinical Measurements: Goal: Respiratory complications will improve Outcome: Progressing   Problem: Clinical Measurements: Goal: Cardiovascular complication will be avoided Outcome: Progressing   

## 2017-08-29 NOTE — Progress Notes (Signed)
Family Medicine Teaching Service Daily Progress Note Intern Pager: [phone]  [name] Medical record number: [id number] Date of birth: 1950/07/08 Age: 67 y.o. Gender: male  Primary Care Provider: Kathrene Alu, MD Consultants: none Code Status: Full  Pt Overview and Major Events to Date:  7/23 admitted for CHF  Assessment and Plan: Leonard [name] is a 67 y.o. male presenting with reported O2 desaturation per SNF. PMH is significant for COPD, CKD Stage 4, CAD s/p CABG, T2DM, HTN, Hypothyroidism, and HLD.  CHF Exacerbation: Acute on chronic but improving, stable on home 2L UOP 5400cc on 7/25. Receiving IV Lasix 120mg  BID.  Per cards, may need RHC in the future to confirm pulmonary HTN.   Procalcitonin 0.15. Patient states that his breathing is improved today. No pitting edema right foot, 1-2+on upper thigh/hip. PT/OT rec SNF - f/u Heart failure recs, appreciate consultation - continue O2 therapy, goal O2 sats 88-92%, baseline 2L - cont continuous pulse ox - cont chest PT, flutter valve, 3% NS nebs, mucinex  - will repeat CXR prn for worsening SOB, fever, increased WBC - f/u repeat blood culture - NGx2days  AKI on Chronic Kidney Disease, Stage 4: improving BL ~1.6-1.9, 2.02 7/27 - on IV Lasix 120mg  BID per cards - consider Nephro consult if Cr worsens - patient has fistula in LUE  COPD - Stable on home 2L No wheezing on exam -O2 goal 88-92%, nurse to titrate O2 accordingly - on continuous pulse ox - cont duoneb q6h prn  Anemia: Chronic, stable Hgb 7/27 AM 7.3. Cardiology recommends transfusion threshold of <7. Iron 37, rest of anemia panel WNL. - IV iron per cards - Monitor CBC  Hypertension: Chronic, not urgent but uncontrolled on 25mg  TID hydralazine  -hold torsemide and amlodipine - increase hydralazine to 50mg  TID.   Per cards, will plan to titrate up to home 100mg  QID as needed - cont IV Lasix per cards - cont coreg 12.5 BID - cont home  imdur - cont home clonidine - cont to hold doxazosin - continue to monitor BP  T2DM: Controlled.  CBGs mid 100s -sSSI -monitor CBG AC/HS  HLD: Chronic On Lipitor 40mg . - Cont home lipitor  Hypothyroidism: Chronic, stable Last TSH 3.338 on 07/10/2017.  On Synthroid 20mcg at home. -Cont home synthroid  Hx DVT, PAD On Eliquis in the past, discontinued at recent hospitalization.  On ASA 81 QD. - Cont home ASA  Hx Left Leg AKA: Stable - no intervention needed - PT/OT while inpatient  Gout: Chronic, no acute flare On allopurinol at home - cont to hold allopurinol due to Cr increase  BPH: Chronic, will add doxazosin back if still HTN 7/28 On doxazosin at home. -hold doxazosin due to low-normal BP  FEN/GI: Heart Healthy Prophylaxis: Lovenox  Disposition: to SNF once cleared by cards  Subjective:  Feeling 7/10 from baseline per patient.  No major complaints other than wanting popcorn and candy.  No pain, no SOB  Objective: Temp:  [99 F (37.2 C)-99.5 F (37.5 C)] 99.5 F (37.5 C) (07/27 0551) Pulse Rate:  [70-75] 75 (07/27 0827) Resp:  [16-18] 16 (07/27 0827) BP: (130-157)/(49-61) 144/51 (07/27 0827) SpO2:  [91 %-97 %] 91 % (07/27 0827) Weight:  [171 lb 15.3 oz (78 kg)] 171 lb 15.3 oz (78 kg) (07/27 0558)   Physical Exam: General: pleasant, NAD on 2L Lungs: no crackles on exam, some minimal light wheeze. No IWB Card: RRR, no murmur Abdomen: soft, no belly pain Skin:  no lesions to exposed skin Extremities: no edema to foot, 1-2+ to femur/hip Psych: A&Ox3, pleasant   Laboratory: Recent Labs  Lab 08/27/17 0556 08/28/17 0540 08/29/17 0418  WBC 4.5 5.4 6.4  HGB 7.4* 7.4* 7.3*  HCT 24.2* 24.2* 24.1*  PLT 180 183 182   Recent Labs  Lab 08/27/17 0556 08/28/17 0540 08/29/17 0418  NA 140 140 136  K 4.3 4.2 4.1  CL 107 104 99  CO2 22 25 25   BUN 42* 42* 43*  CREATININE 2.85* 2.49* 2.02*  CALCIUM 8.4* 8.5* 8.5*  GLUCOSE 165* 124* 186*       Imaging/Diagnostic Tests: No results found.   Sherene Sires, DO 08/29/2017, 8:54 AM PGY-2, [place] Intern pager: [phone], text pages welcome

## 2017-08-30 ENCOUNTER — Inpatient Hospital Stay (HOSPITAL_COMMUNITY): Payer: Medicare Other

## 2017-08-30 DIAGNOSIS — J181 Lobar pneumonia, unspecified organism: Secondary | ICD-10-CM | POA: Diagnosis not present

## 2017-08-30 DIAGNOSIS — N185 Chronic kidney disease, stage 5: Secondary | ICD-10-CM | POA: Diagnosis not present

## 2017-08-30 DIAGNOSIS — J440 Chronic obstructive pulmonary disease with acute lower respiratory infection: Secondary | ICD-10-CM | POA: Diagnosis not present

## 2017-08-30 DIAGNOSIS — J90 Pleural effusion, not elsewhere classified: Secondary | ICD-10-CM | POA: Diagnosis not present

## 2017-08-30 DIAGNOSIS — I959 Hypotension, unspecified: Secondary | ICD-10-CM | POA: Diagnosis not present

## 2017-08-30 DIAGNOSIS — I132 Hypertensive heart and chronic kidney disease with heart failure and with stage 5 chronic kidney disease, or end stage renal disease: Secondary | ICD-10-CM | POA: Diagnosis not present

## 2017-08-30 DIAGNOSIS — N184 Chronic kidney disease, stage 4 (severe): Secondary | ICD-10-CM | POA: Diagnosis not present

## 2017-08-30 DIAGNOSIS — I2510 Atherosclerotic heart disease of native coronary artery without angina pectoris: Secondary | ICD-10-CM | POA: Diagnosis not present

## 2017-08-30 DIAGNOSIS — J449 Chronic obstructive pulmonary disease, unspecified: Secondary | ICD-10-CM | POA: Diagnosis not present

## 2017-08-30 DIAGNOSIS — R0602 Shortness of breath: Secondary | ICD-10-CM | POA: Diagnosis not present

## 2017-08-30 DIAGNOSIS — I5033 Acute on chronic diastolic (congestive) heart failure: Secondary | ICD-10-CM | POA: Diagnosis not present

## 2017-08-30 DIAGNOSIS — R531 Weakness: Secondary | ICD-10-CM | POA: Diagnosis not present

## 2017-08-30 DIAGNOSIS — Z7401 Bed confinement status: Secondary | ICD-10-CM | POA: Diagnosis not present

## 2017-08-30 DIAGNOSIS — N179 Acute kidney failure, unspecified: Secondary | ICD-10-CM | POA: Diagnosis not present

## 2017-08-30 DIAGNOSIS — M2550 Pain in unspecified joint: Secondary | ICD-10-CM | POA: Diagnosis not present

## 2017-08-30 DIAGNOSIS — J9601 Acute respiratory failure with hypoxia: Secondary | ICD-10-CM | POA: Diagnosis not present

## 2017-08-30 LAB — CBC
HEMATOCRIT: 26.2 % — AB (ref 39.0–52.0)
Hemoglobin: 7.9 g/dL — ABNORMAL LOW (ref 13.0–17.0)
MCH: 27 pg (ref 26.0–34.0)
MCHC: 30.2 g/dL (ref 30.0–36.0)
MCV: 89.4 fL (ref 78.0–100.0)
Platelets: 199 10*3/uL (ref 150–400)
RBC: 2.93 MIL/uL — ABNORMAL LOW (ref 4.22–5.81)
RDW: 16.3 % — ABNORMAL HIGH (ref 11.5–15.5)
WBC: 6.2 10*3/uL (ref 4.0–10.5)

## 2017-08-30 LAB — BASIC METABOLIC PANEL
Anion gap: 10 (ref 5–15)
BUN: 44 mg/dL — ABNORMAL HIGH (ref 8–23)
CALCIUM: 8.6 mg/dL — AB (ref 8.9–10.3)
CO2: 31 mmol/L (ref 22–32)
CREATININE: 1.76 mg/dL — AB (ref 0.61–1.24)
Chloride: 100 mmol/L (ref 98–111)
GFR calc Af Amer: 45 mL/min — ABNORMAL LOW (ref >60)
GFR, EST NON AFRICAN AMERICAN: 39 mL/min — AB (ref >60)
Glucose, Bld: 171 mg/dL — ABNORMAL HIGH (ref 70–99)
Potassium: 3.3 mmol/L — ABNORMAL LOW (ref 3.5–5.1)
Sodium: 141 mmol/L (ref 135–145)

## 2017-08-30 LAB — GLUCOSE, CAPILLARY
GLUCOSE-CAPILLARY: 216 mg/dL — AB (ref 70–99)
Glucose-Capillary: 139 mg/dL — ABNORMAL HIGH (ref 70–99)

## 2017-08-30 MED ORDER — GUAIFENESIN ER 600 MG PO TB12: 1200.0000 mg | ORAL_TABLET | Freq: Two times a day (BID) | ORAL | 0 refills | Status: AC

## 2017-08-30 MED ORDER — FERROUS SULFATE 325 (65 FE) MG PO TABS: 325.0000 mg | ORAL_TABLET | Freq: Every day | ORAL | 0 refills | Status: AC

## 2017-08-30 MED ORDER — POTASSIUM CHLORIDE CRYS ER 20 MEQ PO TBCR
40.0000 meq | EXTENDED_RELEASE_TABLET | Freq: Once | ORAL | Status: AC
  Administered 2017-08-30: 40 meq via ORAL
  Filled 2017-08-30: qty 2

## 2017-08-30 MED ORDER — TORSEMIDE 20 MG PO TABS: 40.0000 mg | ORAL_TABLET | Freq: Every day | ORAL | Status: DC

## 2017-08-30 MED ORDER — TORSEMIDE 20 MG PO TABS: 40.0000 mg | ORAL_TABLET | Freq: Every day | ORAL | 0 refills | Status: DC

## 2017-08-30 MED ORDER — HYDRALAZINE HCL 50 MG PO TABS
100.0000 mg | ORAL_TABLET | Freq: Three times a day (TID) | ORAL | Status: DC
  Administered 2017-08-30: 100 mg via ORAL
  Filled 2017-08-30: qty 2

## 2017-08-30 MED ORDER — CARVEDILOL 12.5 MG PO TABS: 12.5000 mg | ORAL_TABLET | Freq: Two times a day (BID) | ORAL | 0 refills | Status: AC

## 2017-08-30 MED ORDER — DARBEPOETIN ALFA 40 MCG/0.4ML IJ SOSY: 40.0000 ug | PREFILLED_SYRINGE | INTRAMUSCULAR | 2 refills | Status: AC

## 2017-08-30 MED ORDER — DOXAZOSIN MESYLATE 8 MG PO TABS: 8.0000 mg | ORAL_TABLET | Freq: Every evening | ORAL | Status: DC

## 2017-08-30 NOTE — Progress Notes (Addendum)
Family Medicine Teaching Service Daily Progress Note Intern Pager: [phone]  [name] Medical record number: [id number] Date of birth: Mar 04, 1950 Age: 67 y.o. Gender: male  Primary Care Provider: Kathrene Alu, MD Consultants: none Code Status: Full  Pt Overview and Major Events to Date:  7/23 admitted for CHF  Assessment and Plan: Leonard [name] is a 67 y.o. male presenting with reported O2 desaturation per SNF. PMH is significant for COPD, CKD Stage 4, CAD s/p CABG, T2DM, HTN, Hypothyroidism, and HLD.  CHF Exacerbation: Acute on chronic, Improving UOP 2650 cc on 7/27.  Per cards, no further metolazone and IV Lasix. Will restart torsemide at increased dose of 40mg  QD. On exam this AM, no edema RLE, minimal right basilar crackles.  O2 on home 2L with 99% O2 sats. - f/u Heart failure recs, appreciate consultation - continue O2 therapy, goal O2 sats 88-92%, baseline 2L, can go below home O2 if within sats goal - holding off on spironolactone due to AKI, per cards - cont continuous pulse ox - cont chest PT, flutter valve, 3% NS nebs, mucinex  - will repeat CXR prn for worsening SOB, fever, increased WBC - f/u repeat blood culture - NGTD  AKI on Chronic Kidney Disease, Stage 4: Improving, at baseline BL ~1.6-1.9, Cr pending 1.76. - cont to monitor BMP - on Torsemide 40mg  QD per cards - consider Nephro consult if Cr worsens - patient has fistula in LUE  Hypokalemia: K 3.3 Decreased K to 3.3 this AM - give K-dur 40mEq once - continue to monitor BMP  COPD - Stable on home 2L  O2 sats 99% on 2L.  No wheezing on exam. -O2 goal 88-92%, nurse to titrate O2 accordingly - on continuous pulse ox - cont duoneb q6h prn  Anemia: Chronic, stable Hgb 7.9 this AM. Cardiology recommends transfusion threshold of <7. - s/p Feraheme 7/26 - Started on Aranesp per cards - Monitor CBC  Hypertension: Chronic BP 160/63 this AM.  -hold torsemide and amlodipine -  inc hydralazine to 100mg  TID, patient's home dose - cont IV Lasix per cards - cont coreg 12.5 BID - cont home imdur - cont home clonidine - restart doxazosin today - continue to monitor BP  T2DM: Chronic, controlled.   CBG 139 this AM. -sSSI -monitor CBG AC/HS  HLD: Chronic On Lipitor 40mg . - Cont home lipitor  Hypothyroidism: Chronic, stable Last TSH 3.338 on 07/10/2017.  On Synthroid 47mcg at home. -Cont home synthroid  Hx DVT, PAD: Chronic, stable On Eliquis in the past, discontinued at recent hospitalization.  On ASA 81 QD. - Cont home ASA  Hx Left Leg AKA: stable - no intervention needed - PT/OT while inpatient  Gout: Chronic, no acute flare On allopurinol at home.  Cr pending this AM, can consider restarting Allopurinol if AKI resolved. - cont to hold allopurinol due to Cr increase  BPH: Chronic, restarting home doxazosin today On doxazosin at home. BP 160/63 this AM. -restart home doxazosin - cont to monitor BP  FEN/GI: Heart Healthy Prophylaxis: Lovenox  Disposition: to SNF once cleared by cards  Subjective:  Patient denies new complaints today.  Says that his breathing has improved. Denies chest pain.  Objective: Temp:  [98 F (36.7 C)-99.6 F (37.6 C)] 98 F (36.7 C) (07/28 0521) Pulse Rate:  [69-75] 70 (07/28 0848) Resp:  [16-20] 18 (07/28 0521) BP: (130-160)/(53-63) 130/57 (07/28 0848) SpO2:  [93 %-99 %] 99 % (07/28 0931) Weight:  [164 lb 10.9 oz (  74.7 kg)] 164 lb 10.9 oz (74.7 kg) (07/28 0521)   Physical Exam: General: 67 y.o. y.o. male in NAD, on 2L Cardio: RRR no m/r/g Lungs: Minimal crackles right lung base, no wheezing, no IWB Abdomen: Soft, non-tender to palpation, positive bowel sounds Skin: warm and dry Extremities: No edema RLE   Laboratory: Recent Labs  Lab 08/28/17 0540 08/29/17 0418 08/30/17 0840  WBC 5.4 6.4 6.2  HGB 7.4* 7.3* 7.9*  HCT 24.2* 24.1* 26.2*  PLT 183 182 199   Recent Labs  Lab 08/28/17 0540  08/29/17 0418 08/30/17 0840  NA 140 136 141  K 4.2 4.1 3.3*  CL 104 99 100  CO2 25 25 31   BUN 42* 43* 44*  CREATININE 2.49* 2.02* 1.76*  CALCIUM 8.5* 8.5* 8.6*  GLUCOSE 124* 186* 171*      Imaging/Diagnostic Tests: Dg Chest Port 1 View  Result Date: 08/30/2017 CLINICAL DATA:  Follow-up pleural effusion EXAM: PORTABLE CHEST 1 VIEW COMPARISON:  08/26/2017 FINDINGS: Cardiac shadow remains enlarged. Postsurgical changes are again seen. Vascular congestion consistent with CHF is noted. Large right-sided pleural effusion is again seen and stable. Some underlying infiltrative changes are likely present similar to that seen on prior CT. IMPRESSION: Stable large right pleural effusion. Changes of CHF. Electronically Signed   By: [name] M.D.   On: 08/30/2017 08:42     Annette Liotta, Bernita Raisin, DO 08/30/2017, 10:17 AM PGY-1, [place] Intern pager: [phone], text pages welcome

## 2017-08-30 NOTE — Progress Notes (Addendum)
Advanced Heart Failure Rounding Note  PCP-Cardiologist: No primary care provider on file.   Subjective:    Continues on IV diuretics and metolazone. Weight down another 7 pounds overnight (20 pounds total).   Creatinine down to 1.7 K 3.3  Not very interactive but says he feels better. No SOB, orthopnea or PND. SBP 130-160   Chest CT 7/24:  1. Large right and small to moderate left pleural effusions. Atelectasis in the lung bases. Focal consolidation with air bronchograms in the right middle lobe likely representing pneumonia. Appearances are similar to previous study except for interval development of the right middle lobe process.  2. Diffuse airspace and interstitial infiltration in the remaining lungs likely indicates edema.  3. Cardiac enlargement. Aortic atherosclerosis. Postoperative coronary bypass.  Objective:   Weight Range: 74.7 kg (164 lb 10.9 oz) Body mass index is 25.04 kg/m.   Vital Signs:   Temp:  [98 F (36.7 C)-99.6 F (37.6 C)] 98 F (36.7 C) (07/28 0521) Pulse Rate:  [69-75] 70 (07/28 0848) Resp:  [16-20] 18 (07/28 0521) BP: (130-160)/(53-63) 130/57 (07/28 0848) SpO2:  [93 %-99 %] 99 % (07/28 0931) Weight:  [74.7 kg (164 lb 10.9 oz)] 74.7 kg (164 lb 10.9 oz) (07/28 0521) Last BM Date: 08/29/17  Weight change: Filed Weights   08/28/17 0148 08/29/17 0558 08/30/17 0521  Weight: 81.8 kg (180 lb 5.4 oz) 78 kg (171 lb 15.3 oz) 74.7 kg (164 lb 10.9 oz)    Intake/Output:   Intake/Output Summary (Last 24 hours) at 08/30/2017 1004 Last data filed at 08/30/2017 0903 Gross per 24 hour  Intake 960 ml  Output 2650 ml  Net -1690 ml      Physical Exam    General:  Lying in bed  Awakens to talk then closes his eyes. No resp difficulty HEENT: normal Neck: supple. JVP 7 Carotids 2+ bilat; no bruits. No lymphadenopathy or thryomegaly appreciated. Cor: PMI nondisplaced. Regular rate & rhythm. No rubs, gallops or murmurs. Lungs: clear Abdomen:  soft, nontender, nondistended. No hepatosplenomegaly. No bruits or masses. Good bowel sounds. Extremities: no cyanosis, clubbing, rash, edema L AKA + condom cath LUE AVF Neuro: alert & orientedx3, cranial nerves grossly intact. moves all 4 extremities w/o difficulty. Affect flat   Telemetry   NSR 60-70s Personally reviewed   EKG    No new tracings.   Labs    CBC Recent Labs    08/29/17 0418 08/30/17 0840  WBC 6.4 6.2  HGB 7.3* 7.9*  HCT 24.1* 26.2*  MCV 87.3 89.4  PLT 182 324   Basic Metabolic Panel Recent Labs    08/28/17 0540 08/29/17 0418  NA 140 136  K 4.2 4.1  CL 104 99  CO2 25 25  GLUCOSE 124* 186*  BUN 42* 43*  CREATININE 2.49* 2.02*  CALCIUM 8.5* 8.5*   Liver Function Tests No results for input(s): AST, ALT, ALKPHOS, BILITOT, PROT, ALBUMIN in the last 72 hours. No results for input(s): LIPASE, AMYLASE in the last 72 hours. Cardiac Enzymes No results for input(s): CKTOTAL, CKMB, CKMBINDEX, TROPONINI in the last 72 hours.  BNP: BNP (last 3 results) Recent Labs    07/22/17 1044 08/13/17 1050 08/25/17 1001  BNP 372.8* 334.1* 526.9*    ProBNP (last 3 results) No results for input(s): PROBNP in the last 8760 hours.   D-Dimer No results for input(s): DDIMER in the last 72 hours. Hemoglobin A1C No results for input(s): HGBA1C in the last 72 hours. Fasting Lipid Panel  No results for input(s): CHOL, HDL, LDLCALC, TRIG, CHOLHDL, LDLDIRECT in the last 72 hours. Thyroid Function Tests No results for input(s): TSH, T4TOTAL, T3FREE, THYROIDAB in the last 72 hours.  Invalid input(s): FREET3  Other results:   Imaging    Dg Chest Port 1 View  Result Date: 08/30/2017 CLINICAL DATA:  Follow-up pleural effusion EXAM: PORTABLE CHEST 1 VIEW COMPARISON:  08/26/2017 FINDINGS: Cardiac shadow remains enlarged. Postsurgical changes are again seen. Vascular congestion consistent with CHF is noted. Large right-sided pleural effusion is again seen and stable.  Some underlying infiltrative changes are likely present similar to that seen on prior CT. IMPRESSION: Stable large right pleural effusion. Changes of CHF. Electronically Signed   By: [name] M.D.   On: 08/30/2017 08:42     Medications:     Scheduled Medications: . aspirin EC  81 mg Oral Daily  . atorvastatin  40 mg Oral q1800  . carvedilol  12.5 mg Oral BID WC  . cloNIDine  0.2 mg Transdermal Weekly  . darbepoetin (ARANESP) injection - NON-DIALYSIS  40 mcg Subcutaneous Q30 days  . doxazosin  8 mg Oral QPM  . enoxaparin (LOVENOX) injection  30 mg Subcutaneous Q24H  . feeding supplement (ENSURE ENLIVE)  237 mL Oral TID BM  . ferrous sulfate  325 mg Oral Q1200  . guaiFENesin  1,200 mg Oral BID  . hydrALAZINE  50 mg Oral Q8H  . insulin aspart  0-9 Units Subcutaneous TID WC  . isosorbide mononitrate  60 mg Oral QHS  . levothyroxine  75 mcg Oral QAC breakfast  . mouth rinse  15 mL Mouth Rinse BID  . sodium chloride flush  3 mL Intravenous Q12H    Infusions: . sodium chloride 250 mL (08/30/17 0856)  . furosemide 120 mg (08/30/17 0857)    PRN Medications: sodium chloride, acetaminophen, ipratropium-albuterol, nitroGLYCERIN, ondansetron (ZOFRAN) IV, polyethylene glycol, sodium chloride flush    Patient Profile   Leonard [name] is a 67 y.o. male with history of CAD s/p CABG, PAD, chronic diastolic CHF, CKD stage IV, COPD on home oxygen, and HTN.  Admitted 0/99/83 with A/C diastolic CHF  Assessment/Plan   1. Acute on chronic diastolic CHF:  - Echo 04/11/2503 LVEF 65-70%, Grade 2 DD, Mild TR, PA peak pressure 46 mm/Hg - Echo 07/20/17 Repeated limited echo to re-assess RV and PA pressure. PA peak pressure 63 mm Hg. RV OK.  - Volume status looks good. Now euvolemic. Will stop IV lasix and metoalzone. - Switch back to torsemide. Was on 20 daily. Will increase to 40 daily (may need more) - Hold off on spiro with AKI - BP elevated 130-160s. Increase hydralazine 100 mg TID  (home dose). Continue imdur 60 mg daily  2. ARF on CKD V - Creatinine continues to improve with diuresis . 3.0 > 2.85 > 2.49 > 2.0. Monitor closely - He has a LUE AV fistula. Would involve renal if cr worsens further.   3. Pulmonary HTN - PA peak pressure 63 mmHg on Echo 07/20/17 - Suspect primarily WHO Group III and WHO Group II pulmonary HTN. Suspect he has mild/mod Pulm Hypertension at baseline and pressures were elevated in the setting of volume overload.   4. CAD s.p CABG 2015 - no s/s ischemia - Continue ASA and statin.   5. Chronic resp failure on home O2 in setting of COPD: Gold stage III - Per primary. - Severe obstruction on prior PFTs. Uses home oxygen.  - Has had multiple admission  over past 2 months.  - Continue Home O2. No change. On 4 L now.   6. h/o DVT - Diagnosed 08/30/16. Now off anticoagulation.  No change.   7. DM - Hgb A1C 6.9 07/05/2017. Has been under better control lately.  - Per Primary. No change.   8. Extensive PAD - s/p L AKA for extensive LLE wounds with no re-vascularization options in 03/2017 by Dr. Donzetta Matters. No change.   9. + Blood Cultures - Suspected contamination with S. Epidermidis. Not on ABX - Leonard [name]. Remains afebrile.   10. Anemia - Hemoglobin 7.2 > 7.4 > 7.4 >7.3> 7.9  this am. Iron also low at 37.  - Received Feraheme 7/26 - I started Aranesp yesterday. Discussed dosing with PharmD personally. - Will leave management to primary team.   11. Pleural effusions: Large on right, moderate on left.  Treat with diuresis for now, doubt parapneumonic/empyema. - Large right pleural effusion persists after diuresis. Would ask IR to tap.   12. HTN - SBP improving with diuresis. Now 130-160 range. Increase hydralazine to 100 tid. Can add back minoxidil as needed per primary.   He has been fully diuresed. Now back on oral diuretics.    Heart failure team will sign off as of 08/30/17  HF Medication Recommendations for Home: As per note  above  Other recommendations (Labs,testing, etc): Consider tap of R pleural effusion   Follow up as an outpatient: Will arrange f/u in HF Clinic.    Medication concerns reviewed with patient and pharmacy team. Barriers identified: None at this time. Pt in SNF.   Length of Stay: [place], MD  08/30/2017, 10:04 AM  Advanced Heart Failure Team Pager [phone] (M-F; 7a - 4p)  Please contact [facility] for night-coverage after hours (4p -7a ) and weekends on [email or web address]

## 2017-08-30 NOTE — Progress Notes (Signed)
Called to give report to SNF- [facility] Spoke to nurse - gave report.  Will send a copy of AVS with [facility] to take with them.

## 2017-08-30 NOTE — Progress Notes (Signed)
Clinical Social Worker facilitated patient discharge including contacting patient family and facility to confirm patient discharge plans.  Clinical information faxed to facility and family agreeable with plan.  CSW arranged ambulance transport via [facility] to [facility].  RN to call for report prior to discharge [phone] Room 113A.  Clinical Social Worker will sign off for now as social work intervention is no longer needed. Please consult [place] again if new need arises.  Mahamad [employer] [phone]

## 2017-08-31 DIAGNOSIS — J9620 Acute and chronic respiratory failure, unspecified whether with hypoxia or hypercapnia: Secondary | ICD-10-CM | POA: Diagnosis not present

## 2017-08-31 DIAGNOSIS — I5032 Chronic diastolic (congestive) heart failure: Secondary | ICD-10-CM | POA: Diagnosis not present

## 2017-08-31 DIAGNOSIS — N184 Chronic kidney disease, stage 4 (severe): Secondary | ICD-10-CM | POA: Diagnosis not present

## 2017-08-31 DIAGNOSIS — J431 Panlobular emphysema: Secondary | ICD-10-CM | POA: Diagnosis not present

## 2017-08-31 DIAGNOSIS — R1312 Dysphagia, oropharyngeal phase: Secondary | ICD-10-CM | POA: Diagnosis not present

## 2017-08-31 DIAGNOSIS — R41841 Cognitive communication deficit: Secondary | ICD-10-CM | POA: Diagnosis not present

## 2017-08-31 DIAGNOSIS — I10 Essential (primary) hypertension: Secondary | ICD-10-CM | POA: Diagnosis not present

## 2017-08-31 DIAGNOSIS — I509 Heart failure, unspecified: Secondary | ICD-10-CM | POA: Diagnosis not present

## 2017-08-31 DIAGNOSIS — D500 Iron deficiency anemia secondary to blood loss (chronic): Secondary | ICD-10-CM | POA: Diagnosis not present

## 2017-08-31 DIAGNOSIS — I480 Paroxysmal atrial fibrillation: Secondary | ICD-10-CM | POA: Diagnosis not present

## 2017-08-31 DIAGNOSIS — D649 Anemia, unspecified: Secondary | ICD-10-CM | POA: Diagnosis not present

## 2017-08-31 DIAGNOSIS — K219 Gastro-esophageal reflux disease without esophagitis: Secondary | ICD-10-CM | POA: Diagnosis not present

## 2017-08-31 DIAGNOSIS — F0150 Vascular dementia without behavioral disturbance: Secondary | ICD-10-CM | POA: Diagnosis not present

## 2017-08-31 LAB — CULTURE, BLOOD (ROUTINE X 2)
Culture: NO GROWTH
Culture: NO GROWTH
Special Requests: ADEQUATE
Special Requests: ADEQUATE

## 2017-09-01 ENCOUNTER — Telehealth: Payer: Self-pay | Admitting: *Deleted

## 2017-09-01 ENCOUNTER — Other Ambulatory Visit: Payer: Self-pay | Admitting: Licensed Clinical Social Worker

## 2017-09-01 DIAGNOSIS — F0150 Vascular dementia without behavioral disturbance: Secondary | ICD-10-CM | POA: Diagnosis not present

## 2017-09-01 DIAGNOSIS — K219 Gastro-esophageal reflux disease without esophagitis: Secondary | ICD-10-CM | POA: Diagnosis not present

## 2017-09-01 DIAGNOSIS — J9620 Acute and chronic respiratory failure, unspecified whether with hypoxia or hypercapnia: Secondary | ICD-10-CM | POA: Diagnosis not present

## 2017-09-01 DIAGNOSIS — R41841 Cognitive communication deficit: Secondary | ICD-10-CM | POA: Diagnosis not present

## 2017-09-01 DIAGNOSIS — R1312 Dysphagia, oropharyngeal phase: Secondary | ICD-10-CM | POA: Diagnosis not present

## 2017-09-01 NOTE — Patient Outreach (Signed)
[facility]) Care Management  09/01/2017  EDWARDS MCKELVIE 23-May-1950 [id number]  [facility] CSW arrived at [facility] in order to complete routine [facility] Consult with patient. [facility] CSW was unable to locate patient in his room, dining area or PT room. [facility] CSW was able to meet successfully with SNF social worker. [facility] CSW was informed that patient's plan is to continue receiving therapy until his skilled days are completed and then he will transition back to long term care. [facility] CSW will follow up within two weeks.  Eula Fried, BSW, MSW, [email or web address] Phone: [phone] Fax: [phone]

## 2017-09-01 NOTE — Telephone Encounter (Signed)
Pt's sister is requesting Dr. Hughes Better to give her a call.  She has a few questions before his appt on Friday.  She did not want to give me anymore information. Fleeger, Salome Spotted, CMA

## 2017-09-01 NOTE — Telephone Encounter (Signed)
Appt canceled . Rayane Gallardo, Salome Spotted, CMA

## 2017-09-01 NOTE — Telephone Encounter (Signed)
Spoke with patient's sister.  Discussed her concerns that patient's PCP is now at [facility] and Medicare will not pay for him to have a hospital follow up at our clinic.  She is very concerned as she would prefer for him to follow up at [facility] for continuity of care and she is not pleased with his care at [facility].  Advised her that Medicare will not pay for him to see two PCP's.  He is supposed to follow up at Heart Failure clinic as outpatient.  I provided her with their phone number and the names of the doctors who saw him at the hospital for her to arrange follow up with them.  Will need to cancel appointment with me on Friday.

## 2017-09-03 DIAGNOSIS — N184 Chronic kidney disease, stage 4 (severe): Secondary | ICD-10-CM | POA: Diagnosis not present

## 2017-09-03 DIAGNOSIS — R1312 Dysphagia, oropharyngeal phase: Secondary | ICD-10-CM | POA: Diagnosis not present

## 2017-09-03 DIAGNOSIS — E088 Diabetes mellitus due to underlying condition with unspecified complications: Secondary | ICD-10-CM | POA: Diagnosis not present

## 2017-09-03 DIAGNOSIS — J9620 Acute and chronic respiratory failure, unspecified whether with hypoxia or hypercapnia: Secondary | ICD-10-CM | POA: Diagnosis not present

## 2017-09-03 DIAGNOSIS — F0150 Vascular dementia without behavioral disturbance: Secondary | ICD-10-CM | POA: Diagnosis not present

## 2017-09-03 DIAGNOSIS — I5032 Chronic diastolic (congestive) heart failure: Secondary | ICD-10-CM | POA: Diagnosis not present

## 2017-09-03 DIAGNOSIS — I5033 Acute on chronic diastolic (congestive) heart failure: Secondary | ICD-10-CM | POA: Diagnosis not present

## 2017-09-03 DIAGNOSIS — R293 Abnormal posture: Secondary | ICD-10-CM | POA: Diagnosis not present

## 2017-09-03 DIAGNOSIS — R41841 Cognitive communication deficit: Secondary | ICD-10-CM | POA: Diagnosis not present

## 2017-09-03 DIAGNOSIS — K219 Gastro-esophageal reflux disease without esophagitis: Secondary | ICD-10-CM | POA: Diagnosis not present

## 2017-09-04 ENCOUNTER — Inpatient Hospital Stay: Payer: Medicare Other | Admitting: Family Medicine

## 2017-09-04 DIAGNOSIS — J9620 Acute and chronic respiratory failure, unspecified whether with hypoxia or hypercapnia: Secondary | ICD-10-CM | POA: Diagnosis not present

## 2017-09-04 DIAGNOSIS — E088 Diabetes mellitus due to underlying condition with unspecified complications: Secondary | ICD-10-CM | POA: Diagnosis not present

## 2017-09-04 DIAGNOSIS — K219 Gastro-esophageal reflux disease without esophagitis: Secondary | ICD-10-CM | POA: Diagnosis not present

## 2017-09-04 DIAGNOSIS — R1312 Dysphagia, oropharyngeal phase: Secondary | ICD-10-CM | POA: Diagnosis not present

## 2017-09-04 DIAGNOSIS — F0150 Vascular dementia without behavioral disturbance: Secondary | ICD-10-CM | POA: Diagnosis not present

## 2017-09-04 DIAGNOSIS — R293 Abnormal posture: Secondary | ICD-10-CM | POA: Diagnosis not present

## 2017-09-05 DIAGNOSIS — E088 Diabetes mellitus due to underlying condition with unspecified complications: Secondary | ICD-10-CM | POA: Diagnosis not present

## 2017-09-05 DIAGNOSIS — K219 Gastro-esophageal reflux disease without esophagitis: Secondary | ICD-10-CM | POA: Diagnosis not present

## 2017-09-05 DIAGNOSIS — R293 Abnormal posture: Secondary | ICD-10-CM | POA: Diagnosis not present

## 2017-09-05 DIAGNOSIS — J9620 Acute and chronic respiratory failure, unspecified whether with hypoxia or hypercapnia: Secondary | ICD-10-CM | POA: Diagnosis not present

## 2017-09-05 DIAGNOSIS — R1312 Dysphagia, oropharyngeal phase: Secondary | ICD-10-CM | POA: Diagnosis not present

## 2017-09-05 DIAGNOSIS — F0150 Vascular dementia without behavioral disturbance: Secondary | ICD-10-CM | POA: Diagnosis not present

## 2017-09-07 DIAGNOSIS — J9620 Acute and chronic respiratory failure, unspecified whether with hypoxia or hypercapnia: Secondary | ICD-10-CM | POA: Diagnosis not present

## 2017-09-07 DIAGNOSIS — K219 Gastro-esophageal reflux disease without esophagitis: Secondary | ICD-10-CM | POA: Diagnosis not present

## 2017-09-07 DIAGNOSIS — R1312 Dysphagia, oropharyngeal phase: Secondary | ICD-10-CM | POA: Diagnosis not present

## 2017-09-07 DIAGNOSIS — R293 Abnormal posture: Secondary | ICD-10-CM | POA: Diagnosis not present

## 2017-09-07 DIAGNOSIS — F0150 Vascular dementia without behavioral disturbance: Secondary | ICD-10-CM | POA: Diagnosis not present

## 2017-09-07 DIAGNOSIS — E088 Diabetes mellitus due to underlying condition with unspecified complications: Secondary | ICD-10-CM | POA: Diagnosis not present

## 2017-09-08 DIAGNOSIS — K219 Gastro-esophageal reflux disease without esophagitis: Secondary | ICD-10-CM | POA: Diagnosis not present

## 2017-09-08 DIAGNOSIS — E088 Diabetes mellitus due to underlying condition with unspecified complications: Secondary | ICD-10-CM | POA: Diagnosis not present

## 2017-09-08 DIAGNOSIS — J9620 Acute and chronic respiratory failure, unspecified whether with hypoxia or hypercapnia: Secondary | ICD-10-CM | POA: Diagnosis not present

## 2017-09-08 DIAGNOSIS — R1312 Dysphagia, oropharyngeal phase: Secondary | ICD-10-CM | POA: Diagnosis not present

## 2017-09-08 DIAGNOSIS — F0150 Vascular dementia without behavioral disturbance: Secondary | ICD-10-CM | POA: Diagnosis not present

## 2017-09-08 DIAGNOSIS — R293 Abnormal posture: Secondary | ICD-10-CM | POA: Diagnosis not present

## 2017-09-09 ENCOUNTER — Other Ambulatory Visit: Payer: Self-pay | Admitting: Licensed Clinical Social Worker

## 2017-09-09 DIAGNOSIS — R1312 Dysphagia, oropharyngeal phase: Secondary | ICD-10-CM | POA: Diagnosis not present

## 2017-09-09 DIAGNOSIS — F0150 Vascular dementia without behavioral disturbance: Secondary | ICD-10-CM | POA: Diagnosis not present

## 2017-09-09 DIAGNOSIS — K219 Gastro-esophageal reflux disease without esophagitis: Secondary | ICD-10-CM | POA: Diagnosis not present

## 2017-09-09 DIAGNOSIS — E088 Diabetes mellitus due to underlying condition with unspecified complications: Secondary | ICD-10-CM | POA: Diagnosis not present

## 2017-09-09 DIAGNOSIS — R293 Abnormal posture: Secondary | ICD-10-CM | POA: Diagnosis not present

## 2017-09-09 DIAGNOSIS — J9620 Acute and chronic respiratory failure, unspecified whether with hypoxia or hypercapnia: Secondary | ICD-10-CM | POA: Diagnosis not present

## 2017-09-09 NOTE — Patient Outreach (Signed)
[facility]) Care Management  09/09/2017  THORNTON DOHRMANN September 15, 1950 [id number]  [facility] CSW spoke with SNF social worker and received confirmation that patient has transitioned from short term rehabilitation to long term rehabilitation. [facility] CSW will present patient's case during [facility]'s difficult case discussion on 09/10/17. [facility] CSW will complete case closure at this time.  Eula Fried, BSW, MSW, [email or web address] Phone: [phone] Fax: [phone]

## 2017-09-10 DIAGNOSIS — R1312 Dysphagia, oropharyngeal phase: Secondary | ICD-10-CM | POA: Diagnosis not present

## 2017-09-10 DIAGNOSIS — K219 Gastro-esophageal reflux disease without esophagitis: Secondary | ICD-10-CM | POA: Diagnosis not present

## 2017-09-10 DIAGNOSIS — R293 Abnormal posture: Secondary | ICD-10-CM | POA: Diagnosis not present

## 2017-09-10 DIAGNOSIS — E088 Diabetes mellitus due to underlying condition with unspecified complications: Secondary | ICD-10-CM | POA: Diagnosis not present

## 2017-09-10 DIAGNOSIS — J9620 Acute and chronic respiratory failure, unspecified whether with hypoxia or hypercapnia: Secondary | ICD-10-CM | POA: Diagnosis not present

## 2017-09-10 DIAGNOSIS — F0150 Vascular dementia without behavioral disturbance: Secondary | ICD-10-CM | POA: Diagnosis not present

## 2017-09-11 DIAGNOSIS — R293 Abnormal posture: Secondary | ICD-10-CM | POA: Diagnosis not present

## 2017-09-11 DIAGNOSIS — E088 Diabetes mellitus due to underlying condition with unspecified complications: Secondary | ICD-10-CM | POA: Diagnosis not present

## 2017-09-11 DIAGNOSIS — F0150 Vascular dementia without behavioral disturbance: Secondary | ICD-10-CM | POA: Diagnosis not present

## 2017-09-11 DIAGNOSIS — J9620 Acute and chronic respiratory failure, unspecified whether with hypoxia or hypercapnia: Secondary | ICD-10-CM | POA: Diagnosis not present

## 2017-09-11 DIAGNOSIS — R1312 Dysphagia, oropharyngeal phase: Secondary | ICD-10-CM | POA: Diagnosis not present

## 2017-09-11 DIAGNOSIS — K219 Gastro-esophageal reflux disease without esophagitis: Secondary | ICD-10-CM | POA: Diagnosis not present

## 2017-09-12 DIAGNOSIS — R293 Abnormal posture: Secondary | ICD-10-CM | POA: Diagnosis not present

## 2017-09-12 DIAGNOSIS — J9620 Acute and chronic respiratory failure, unspecified whether with hypoxia or hypercapnia: Secondary | ICD-10-CM | POA: Diagnosis not present

## 2017-09-12 DIAGNOSIS — E088 Diabetes mellitus due to underlying condition with unspecified complications: Secondary | ICD-10-CM | POA: Diagnosis not present

## 2017-09-12 DIAGNOSIS — R1312 Dysphagia, oropharyngeal phase: Secondary | ICD-10-CM | POA: Diagnosis not present

## 2017-09-12 DIAGNOSIS — F0150 Vascular dementia without behavioral disturbance: Secondary | ICD-10-CM | POA: Diagnosis not present

## 2017-09-12 DIAGNOSIS — K219 Gastro-esophageal reflux disease without esophagitis: Secondary | ICD-10-CM | POA: Diagnosis not present

## 2017-09-14 DIAGNOSIS — R293 Abnormal posture: Secondary | ICD-10-CM | POA: Diagnosis not present

## 2017-09-14 DIAGNOSIS — R1312 Dysphagia, oropharyngeal phase: Secondary | ICD-10-CM | POA: Diagnosis not present

## 2017-09-14 DIAGNOSIS — E088 Diabetes mellitus due to underlying condition with unspecified complications: Secondary | ICD-10-CM | POA: Diagnosis not present

## 2017-09-14 DIAGNOSIS — E039 Hypothyroidism, unspecified: Secondary | ICD-10-CM | POA: Diagnosis not present

## 2017-09-14 DIAGNOSIS — N184 Chronic kidney disease, stage 4 (severe): Secondary | ICD-10-CM | POA: Diagnosis not present

## 2017-09-14 DIAGNOSIS — I150 Renovascular hypertension: Secondary | ICD-10-CM | POA: Diagnosis not present

## 2017-09-14 DIAGNOSIS — K219 Gastro-esophageal reflux disease without esophagitis: Secondary | ICD-10-CM | POA: Diagnosis not present

## 2017-09-14 DIAGNOSIS — I5032 Chronic diastolic (congestive) heart failure: Secondary | ICD-10-CM | POA: Diagnosis not present

## 2017-09-14 DIAGNOSIS — F0150 Vascular dementia without behavioral disturbance: Secondary | ICD-10-CM | POA: Diagnosis not present

## 2017-09-14 DIAGNOSIS — J9620 Acute and chronic respiratory failure, unspecified whether with hypoxia or hypercapnia: Secondary | ICD-10-CM | POA: Diagnosis not present

## 2017-09-14 NOTE — Progress Notes (Signed)
Advanced Heart Failure Clinic Note  PCP: Dr. Juanito Doom Cardiology: Dr. Aundra Dubin Nephrology: Dr Marval Regal.   Leonard [name] is a 67 y.o. male with history of CAD s/p CABG, PAD, chronic diastolic CHF, CKD stage IV, COPD on home oxygen, and HTN presents for cardiology followup.  He has had multiple admissions this year for diastolic CHF.  Last echo in 5/17 showed EF 60-65% with grade II diastolic dysfunction.  He also has COPD with PFTs showing severe obstruction.    Admitted 7/23-7/28/19 with A/C HF. Initial poor response to IV lasix with worsening creatinine. HF team consulted. Diuresed 20 lbs with high dose lasix and metolazone. AKI resolved prior to discharge. Transitioned to torsemide 40 mg daily (previously on 20 mg daily). Chest CT showed large right pleural effusion. Did not get thoracentesis. He was also started on aranesp for anemia. DC weight: 164 lbs. DCd to SNF.   He presents today for post hospital follow up. Overall doing okay. He is now at [facility] for rehab. Denies SOB, but is not very active. Denies orthopnea. Has mild RLE edema. Denies cough, fever, chills. No CP or dizziness. Wearing 2 L O2 at all times. Appetite and energy level okay. Weight is down 3 lbs on our scale. Getting all meds through SNF. Wife is present with him and says she wishes he was getting more PT at SNF.  Labs (8/17): K 3.9, creatinine 2.33, HCT 28.5, LDL 46 Labs (11/17): hgb 9, K 4.4, creatinine 3, BNP 64 Labs 03/24/2016: K 3.5 Creatinine 2.48.  Labs 05/09/2016: K 4.3 Creatinine 3.13 Hgb 9.0   PMH: 1. Chronic diastolic CHF: Multiple recent admissions.  Echo (5/17) with EF 60-65%, grade II diastolic dysfunction, normal RV size and systolic function.  2. PAD: Right femoral PCI in 12/14. Peripheral arterial dopplers in 6/17 with occluded right SFA.  He follows with [facility].  3. HTN: Renal artery dopplers negative in 5/17.  4. Hypothyroidism 5. CKD stage IV: Follows with Dr. Marval Regal. 6. CAD: s/p CABG x 3 in  3/15.  7. OSA: Mild, not on CPAP.  8. COPD: PFTs (3/15) with FVC 77%, FEV1 47%, ratio 59%, TLC 55% => severe obstruction.  Prior smoker. He is on 2 L home O2.  9. 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 Hyperlipidemia 12. Anemia of renal disease   Social History   Socioeconomic History  . Marital status: Single    Spouse name: Not on file  . Number of children: Not on file  . Years of education: Not on file  . Highest education level: Not on file  Occupational History  . Not on file  Social Needs  . Financial resource strain: Not on file  . Food insecurity:    Worry: Not on file    Inability: Not on file  . Transportation needs:    Medical: Not on file    Non-medical: Not on file  Tobacco Use  . Smoking status: Former Smoker    Packs/day: 1.00    Years: 46.00    Pack years: 46.00    Types: Cigarettes    Start date: 02/04/1967    Last attempt to quit: 02/03/2013    Years since quitting: 4.6  . Smokeless tobacco: Never Used  Substance and Sexual Activity  . Alcohol use: No    Alcohol/week: 0.0 standard drinks  . Drug use: No  . Sexual activity: Not Currently  Lifestyle  . Physical activity:    Days per week: Not on file    Minutes  per session: Not on file  . Stress: Not on file  Relationships  . Social connections:    Talks on phone: Not on file    Gets together: Not on file    Attends religious service: Not on file    Active member of club or organization: Not on file    Attends meetings of clubs or organizations: Not on file    Relationship status: Not on file  . Intimate partner violence:    Fear of current or ex partner: Not on file    Emotionally abused: Not on file    Physically abused: Not on file    Forced sexual activity: Not on file  Other Topics Concern  . Not on file  Social History Narrative   Lives with sister, [name] in [place].  Retired - former Sports coach.   Patient in skilled nursing facility   Family History  Problem Relation Age of  Onset  . Cancer Mother        lung cancer  . Hypertension Mother   . Cancer Sister        patient thinks it was uterine cancer  . Hypertension Sister   . Heart attack Sister   . Stroke Neg Hx    Review of systems complete and found to be negative unless listed in HPI.   Current Outpatient Medications  Medication Sig Dispense Refill  . acetaminophen (TYLENOL) 325 MG tablet Take 650 mg by mouth every 8 (eight) hours as needed for mild pain or fever.     Marland Kitchen aspirin EC 81 MG tablet Take 1 tablet (81 mg total) by mouth daily. 150 tablet 0  . atorvastatin (LIPITOR) 40 MG tablet TAKE ONE (1) TABLET BY MOUTH EVERY DAY AT 6:00PM (Patient taking differently: Take 40 mg by mouth once a day at 6 PM) 90 tablet 3  . carvedilol (COREG) 12.5 MG tablet Take 1 tablet (12.5 mg total) by mouth 2 (two) times daily with a meal. 60 tablet 0  . cloNIDine (CATAPRES - DOSED IN MG/24 HR) 0.2 mg/24hr patch Place 1 patch (0.2 mg total) onto the skin once a week. (Patient taking differently: Place 0.2 mg onto the skin every Monday. ) 4 patch 0  . [START ON 09/28/2017] Darbepoetin Alfa (ARANESP) 40 MCG/0.4ML SOSY injection Inject 0.4 mLs (40 mcg total) into the skin every 30 (thirty) days. 8.4 mL 2  . doxazosin (CARDURA) 8 MG tablet Take 1 tablet (8 mg total) by mouth every evening.    . ferrous sulfate (FEROSUL) 325 (65 FE) MG tablet Take 1 tablet (325 mg total) by mouth daily at 12 noon. 30 tablet 0  . hydrALAZINE (APRESOLINE) 100 MG tablet TAKE ONE TABLET BY MOUTH EVERY EIGHT HOURS 90 tablet 1  . insulin aspart (NOVOLOG FLEXPEN) 100 UNIT/ML FlexPen Inject 15 Units 2 (two) times daily before lunch and supper into the skin. (Patient taking differently: Inject 0-15 Units into the skin See admin instructions. Inject as per sliding scale: 0-150= 0 units 151-400=15 units Call PCP if CBG<70 or >401) 15 mL 5  . insulin glargine (LANTUS) 100 UNIT/ML injection Inject 24 Units into the skin at bedtime.    . isosorbide  mononitrate (IMDUR) 60 MG 24 hr tablet TAKE ONE (1) TABLET BY MOUTH EVERY DAY (Patient taking differently: TAKE ONE (1) TABLET BY MOUTH AT BEDTIME) 30 tablet 3  . levothyroxine (SYNTHROID, LEVOTHROID) 75 MCG tablet Take 75 mcg by mouth daily before breakfast.     . pantoprazole (  PROTONIX) 40 MG tablet Take 40 mg by mouth daily.    . polyethylene glycol (MIRALAX / GLYCOLAX) packet Take 17 g by mouth daily as needed for mild constipation or moderate constipation. (Patient taking differently: Take 17 g by mouth every 8 (eight) hours as needed for mild constipation. ) 14 each 0  . Blood Glucose Monitoring Suppl (ONETOUCH VERIO FLEX SYSTEM) w/Device KIT 1 kit by Does not apply route daily. 1 kit 3  . feeding supplement, ENSURE ENLIVE, (ENSURE ENLIVE) LIQD Take 237 mLs by mouth 3 (three) times daily between meals. 237 mL 12  . glucose blood (ONETOUCH VERIO) test strip Use as instructed to test three times daily. ICD-10 code: E11.22 100 each 12  . ipratropium-albuterol (DUONEB) 0.5-2.5 (3) MG/3ML SOLN Take 3 mLs by nebulization 4 (four) times daily as needed (shortness of breath and wheezing).     . nitroGLYCERIN (NITROSTAT) 0.4 MG SL tablet Place 1 tablet (0.4 mg total) under the tongue every 5 (five) minutes as needed for chest pain. (Patient not taking: Reported on 09/15/2017) 30 tablet 12  . ONETOUCH DELICA LANCETS 60Y MISC 1 each by Does not apply route 3 (three) times daily. ICD-10 code: E11.22 100 each 12  . torsemide (DEMADEX) 20 MG tablet Take 2 tablets (40 mg total) by mouth daily. 60 tablet 0   No current facility-administered medications for this encounter.    Facility-Administered Medications Ordered in Other Encounters  Medication Dose Route Frequency Provider Last Rate Last Dose  . sodium chloride flush (NS) 0.9 % injection 10 mL  10 mL Intracatheter PRN Alvy Bimler, Ni, MD         BP (!) 160/82   Pulse 72   Wt 73 kg (161 lb)   SpO2 100% Comment: on 2L  BMI 24.48 kg/m    Filed Weights     09/15/17 1012  Weight: 73 kg (161 lb)    Wt Readings from Last 3 Encounters:  09/15/17 73 kg (161 lb)  08/30/17 74.7 kg (164 lb 10.9 oz)  08/13/17 79.4 kg (175 lb)   General: Well appearing. No resp difficulty. HEENT: Normal Neck: Supple. JVP 5-6. Carotids 2+ bilat; no bruits. No thyromegaly or nodule noted. Cor: PMI nondisplaced. RRR, No M/G/R noted Lungs: CTAB, normal effort. Abdomen: Soft, non-tender, non-distended, no HSM. No bruits or masses. +BS  Extremities: No cyanosis, clubbing, or rash. RLE trace ankle edema. Left AKA Neuro: Alert & orientedx3, cranial nerves grossly intact. moves all 4 extremities w/o difficulty. Affect pleasant   Assessment/Plan: 1. Chronic diastolic CHF: Echo 04/158: EF 65-70%, grade 2 DD, mild TR, PA peak pressure 46 mmHg - NYHA II - Volume status stable.  - Continue torsemide 40 mg daily.  - Not on spiro with CKD. - Reinforced dietary restrictions and fluid restrictions.  - Add TED hose to RLE  2. HTN:  - On hydralazine, imdur, cardura, and clonidine.  - Elevated today, but wife says he is normally SBP 90s.  - Will have SNF monitor BP and let us know if BP remains elevated.   3. CAD s.p CABG - No s/s ischemia.  - Continue ASA, statin and Coreg.      4. COPD: Gold stage III   - Severe obstruction on prior PFTs. Uses 2L home oxygen.  - Follows with Dr. Melvyn Novas. Sees 8/26.    5. CKD: Stage V.-  L AVF. Follows with Dr Marval Regal.  - Recent AKI. Creatinine 1.76 on 7/28.   6. Hx of DVT - Diagnosed  08/30/16 - No longer on AC.    7. Pleural effusions R>L - Did not receive thoracentesis while inpatient. Sees pulmonary 8/26 - RLL sounds clear today  8. Extensive PAD - s/p L AKA for extensive LLE wounds with no re-vascularization options in 03/2017 by Dr. Donzetta Matters.  9. Anemia - Received IV iron while inpatient. Started on aranesp last admission. Check CBC today.   10. Pulmonary HTN - PA peak pressure 63 mmHg on Echo 07/20/17 - Suspect  primarily WHO Group III and WHO Group II pulmonary HTN. Suspect he has mild/modPulm Hypertensionat baselineand pressures wereelevated in the setting of volume overload.  11. Deconditioning - Encourage increased activity with PT at SNF. Limited by Left AKA  BMET, CBC today Add TED hose to RLE Have SNF monitor BP and let us know if consistently high.  Follow up in 3 weeks  Georgiana Shore, NP 09/15/2017   Greater than 50% of the 25 minute visit was spent in counseling/coordination of care regarding disease state education, salt/fluid restriction, sliding scale diuretics, and medication compliance.

## 2017-09-15 ENCOUNTER — Ambulatory Visit (HOSPITAL_COMMUNITY)
Admission: RE | Admit: 2017-09-15 | Discharge: 2017-09-15 | Disposition: A | Discharge status: Home / Self Care | Payer: Medicare Other | Source: Ambulatory Visit | Attending: Internal Medicine | Admitting: Internal Medicine

## 2017-09-15 VITALS — BP 160/82 | HR 72 | Wt 161.0 lb

## 2017-09-15 DIAGNOSIS — Z9981 Dependence on supplemental oxygen: Secondary | ICD-10-CM | POA: Insufficient documentation

## 2017-09-15 DIAGNOSIS — Z89612 Acquired absence of left leg above knee: Secondary | ICD-10-CM | POA: Diagnosis not present

## 2017-09-15 DIAGNOSIS — Z7982 Long term (current) use of aspirin: Secondary | ICD-10-CM | POA: Diagnosis not present

## 2017-09-15 DIAGNOSIS — G4733 Obstructive sleep apnea (adult) (pediatric): Secondary | ICD-10-CM | POA: Diagnosis not present

## 2017-09-15 DIAGNOSIS — Z8249 Family history of ischemic heart disease and other diseases of the circulatory system: Secondary | ICD-10-CM | POA: Diagnosis not present

## 2017-09-15 DIAGNOSIS — J90 Pleural effusion, not elsewhere classified: Secondary | ICD-10-CM | POA: Diagnosis not present

## 2017-09-15 DIAGNOSIS — D649 Anemia, unspecified: Secondary | ICD-10-CM

## 2017-09-15 DIAGNOSIS — J449 Chronic obstructive pulmonary disease, unspecified: Secondary | ICD-10-CM | POA: Diagnosis not present

## 2017-09-15 DIAGNOSIS — N185 Chronic kidney disease, stage 5: Secondary | ICD-10-CM | POA: Insufficient documentation

## 2017-09-15 DIAGNOSIS — E039 Hypothyroidism, unspecified: Secondary | ICD-10-CM | POA: Insufficient documentation

## 2017-09-15 DIAGNOSIS — E088 Diabetes mellitus due to underlying condition with unspecified complications: Secondary | ICD-10-CM | POA: Diagnosis not present

## 2017-09-15 DIAGNOSIS — R1312 Dysphagia, oropharyngeal phase: Secondary | ICD-10-CM | POA: Diagnosis not present

## 2017-09-15 DIAGNOSIS — R5381 Other malaise: Secondary | ICD-10-CM

## 2017-09-15 DIAGNOSIS — Z79899 Other long term (current) drug therapy: Secondary | ICD-10-CM | POA: Insufficient documentation

## 2017-09-15 DIAGNOSIS — I272 Pulmonary hypertension, unspecified: Secondary | ICD-10-CM

## 2017-09-15 DIAGNOSIS — I739 Peripheral vascular disease, unspecified: Secondary | ICD-10-CM | POA: Diagnosis not present

## 2017-09-15 DIAGNOSIS — I10 Essential (primary) hypertension: Secondary | ICD-10-CM

## 2017-09-15 DIAGNOSIS — E1122 Type 2 diabetes mellitus with diabetic chronic kidney disease: Secondary | ICD-10-CM | POA: Insufficient documentation

## 2017-09-15 DIAGNOSIS — E119 Type 2 diabetes mellitus without complications: Secondary | ICD-10-CM | POA: Diagnosis not present

## 2017-09-15 DIAGNOSIS — K219 Gastro-esophageal reflux disease without esophagitis: Secondary | ICD-10-CM | POA: Diagnosis not present

## 2017-09-15 DIAGNOSIS — Z87891 Personal history of nicotine dependence: Secondary | ICD-10-CM | POA: Insufficient documentation

## 2017-09-15 DIAGNOSIS — J9620 Acute and chronic respiratory failure, unspecified whether with hypoxia or hypercapnia: Secondary | ICD-10-CM | POA: Diagnosis not present

## 2017-09-15 DIAGNOSIS — R293 Abnormal posture: Secondary | ICD-10-CM | POA: Diagnosis not present

## 2017-09-15 DIAGNOSIS — I132 Hypertensive heart and chronic kidney disease with heart failure and with stage 5 chronic kidney disease, or end stage renal disease: Secondary | ICD-10-CM | POA: Insufficient documentation

## 2017-09-15 DIAGNOSIS — Z794 Long term (current) use of insulin: Secondary | ICD-10-CM | POA: Diagnosis not present

## 2017-09-15 DIAGNOSIS — Z951 Presence of aortocoronary bypass graft: Secondary | ICD-10-CM

## 2017-09-15 DIAGNOSIS — Z86718 Personal history of other venous thrombosis and embolism: Secondary | ICD-10-CM | POA: Diagnosis not present

## 2017-09-15 DIAGNOSIS — N184 Chronic kidney disease, stage 4 (severe): Secondary | ICD-10-CM

## 2017-09-15 DIAGNOSIS — E785 Hyperlipidemia, unspecified: Secondary | ICD-10-CM | POA: Diagnosis not present

## 2017-09-15 DIAGNOSIS — F0150 Vascular dementia without behavioral disturbance: Secondary | ICD-10-CM | POA: Diagnosis not present

## 2017-09-15 DIAGNOSIS — I5022 Chronic systolic (congestive) heart failure: Secondary | ICD-10-CM

## 2017-09-15 DIAGNOSIS — I2510 Atherosclerotic heart disease of native coronary artery without angina pectoris: Secondary | ICD-10-CM | POA: Insufficient documentation

## 2017-09-15 DIAGNOSIS — Z79890 Hormone replacement therapy (postmenopausal): Secondary | ICD-10-CM | POA: Diagnosis not present

## 2017-09-15 DIAGNOSIS — I5032 Chronic diastolic (congestive) heart failure: Secondary | ICD-10-CM | POA: Diagnosis not present

## 2017-09-15 DIAGNOSIS — I1A0 Resistant hypertension: Secondary | ICD-10-CM

## 2017-09-15 LAB — CBC
HCT: 24.2 % — ABNORMAL LOW (ref 39.0–52.0)
HEMOGLOBIN: 7.2 g/dL — AB (ref 13.0–17.0)
MCH: 26.8 pg (ref 26.0–34.0)
MCHC: 29.8 g/dL — ABNORMAL LOW (ref 30.0–36.0)
MCV: 90 fL (ref 78.0–100.0)
RBC: 2.69 MIL/uL — ABNORMAL LOW (ref 4.22–5.81)
RDW: 15.9 % — ABNORMAL HIGH (ref 11.5–15.5)
WBC: 5.2 10*3/uL (ref 4.0–10.5)

## 2017-09-15 LAB — BASIC METABOLIC PANEL
ANION GAP: 7 (ref 5–15)
BUN: 38 mg/dL — ABNORMAL HIGH (ref 8–23)
CO2: 27 mmol/L (ref 22–32)
CREATININE: 1.53 mg/dL — AB (ref 0.61–1.24)
Calcium: 8.4 mg/dL — ABNORMAL LOW (ref 8.9–10.3)
Chloride: 109 mmol/L (ref 98–111)
GFR calc Af Amer: 53 mL/min — ABNORMAL LOW (ref >60)
GFR, EST NON AFRICAN AMERICAN: 46 mL/min — AB (ref >60)
Glucose, Bld: 165 mg/dL — ABNORMAL HIGH (ref 70–99)
Potassium: 3.7 mmol/L (ref 3.5–5.1)
SODIUM: 143 mmol/L (ref 135–145)

## 2017-09-15 NOTE — Patient Instructions (Signed)
Routine lab work today. Will notify you of abnormal results, otherwise no news is good news!  Apply compression hose to right lower extremity.  Monitor BP at home.  Follow up 3 weeks with Lillia Mountain NP-C.  ___________________________________________________________________________ Leonard [name] Code: 8333  Take all medication as prescribed the day of your appointment. Bring all medications with you to your appointment.  Do the following things EVERYDAY: 1) Weigh yourself in the morning before breakfast. Write it down and keep it in a log. 2) Take your medicines as prescribed 3) Eat low salt foods-Limit salt (sodium) to 2000 mg per day.  4) Stay as active as you can everyday 5) Limit all fluids for the day to less than 2 liters

## 2017-09-16 DIAGNOSIS — J9620 Acute and chronic respiratory failure, unspecified whether with hypoxia or hypercapnia: Secondary | ICD-10-CM | POA: Diagnosis not present

## 2017-09-16 DIAGNOSIS — R1312 Dysphagia, oropharyngeal phase: Secondary | ICD-10-CM | POA: Diagnosis not present

## 2017-09-16 DIAGNOSIS — D649 Anemia, unspecified: Secondary | ICD-10-CM | POA: Diagnosis not present

## 2017-09-16 DIAGNOSIS — K219 Gastro-esophageal reflux disease without esophagitis: Secondary | ICD-10-CM | POA: Diagnosis not present

## 2017-09-16 DIAGNOSIS — R293 Abnormal posture: Secondary | ICD-10-CM | POA: Diagnosis not present

## 2017-09-16 DIAGNOSIS — I509 Heart failure, unspecified: Secondary | ICD-10-CM | POA: Diagnosis not present

## 2017-09-16 DIAGNOSIS — I10 Essential (primary) hypertension: Secondary | ICD-10-CM | POA: Diagnosis not present

## 2017-09-16 DIAGNOSIS — E088 Diabetes mellitus due to underlying condition with unspecified complications: Secondary | ICD-10-CM | POA: Diagnosis not present

## 2017-09-16 DIAGNOSIS — F0150 Vascular dementia without behavioral disturbance: Secondary | ICD-10-CM | POA: Diagnosis not present

## 2017-09-17 DIAGNOSIS — D696 Thrombocytopenia, unspecified: Secondary | ICD-10-CM | POA: Diagnosis not present

## 2017-09-17 DIAGNOSIS — E1122 Type 2 diabetes mellitus with diabetic chronic kidney disease: Secondary | ICD-10-CM | POA: Diagnosis not present

## 2017-09-17 DIAGNOSIS — D631 Anemia in chronic kidney disease: Secondary | ICD-10-CM | POA: Diagnosis not present

## 2017-09-17 DIAGNOSIS — R1312 Dysphagia, oropharyngeal phase: Secondary | ICD-10-CM | POA: Diagnosis not present

## 2017-09-17 DIAGNOSIS — N179 Acute kidney failure, unspecified: Secondary | ICD-10-CM | POA: Diagnosis not present

## 2017-09-17 DIAGNOSIS — J449 Chronic obstructive pulmonary disease, unspecified: Secondary | ICD-10-CM | POA: Diagnosis not present

## 2017-09-17 DIAGNOSIS — E1152 Type 2 diabetes mellitus with diabetic peripheral angiopathy with gangrene: Secondary | ICD-10-CM | POA: Diagnosis not present

## 2017-09-17 DIAGNOSIS — F0150 Vascular dementia without behavioral disturbance: Secondary | ICD-10-CM | POA: Diagnosis not present

## 2017-09-17 DIAGNOSIS — I129 Hypertensive chronic kidney disease with stage 1 through stage 4 chronic kidney disease, or unspecified chronic kidney disease: Secondary | ICD-10-CM | POA: Diagnosis not present

## 2017-09-17 DIAGNOSIS — I5030 Unspecified diastolic (congestive) heart failure: Secondary | ICD-10-CM | POA: Diagnosis not present

## 2017-09-17 DIAGNOSIS — K219 Gastro-esophageal reflux disease without esophagitis: Secondary | ICD-10-CM | POA: Diagnosis not present

## 2017-09-17 DIAGNOSIS — F17201 Nicotine dependence, unspecified, in remission: Secondary | ICD-10-CM | POA: Diagnosis not present

## 2017-09-17 DIAGNOSIS — I2510 Atherosclerotic heart disease of native coronary artery without angina pectoris: Secondary | ICD-10-CM | POA: Diagnosis not present

## 2017-09-17 DIAGNOSIS — J9620 Acute and chronic respiratory failure, unspecified whether with hypoxia or hypercapnia: Secondary | ICD-10-CM | POA: Diagnosis not present

## 2017-09-17 DIAGNOSIS — R293 Abnormal posture: Secondary | ICD-10-CM | POA: Diagnosis not present

## 2017-09-17 DIAGNOSIS — N184 Chronic kidney disease, stage 4 (severe): Secondary | ICD-10-CM | POA: Diagnosis not present

## 2017-09-17 DIAGNOSIS — N2581 Secondary hyperparathyroidism of renal origin: Secondary | ICD-10-CM | POA: Diagnosis not present

## 2017-09-17 DIAGNOSIS — E088 Diabetes mellitus due to underlying condition with unspecified complications: Secondary | ICD-10-CM | POA: Diagnosis not present

## 2017-09-18 DIAGNOSIS — K219 Gastro-esophageal reflux disease without esophagitis: Secondary | ICD-10-CM | POA: Diagnosis not present

## 2017-09-18 DIAGNOSIS — E088 Diabetes mellitus due to underlying condition with unspecified complications: Secondary | ICD-10-CM | POA: Diagnosis not present

## 2017-09-18 DIAGNOSIS — R1312 Dysphagia, oropharyngeal phase: Secondary | ICD-10-CM | POA: Diagnosis not present

## 2017-09-18 DIAGNOSIS — F0150 Vascular dementia without behavioral disturbance: Secondary | ICD-10-CM | POA: Diagnosis not present

## 2017-09-18 DIAGNOSIS — R293 Abnormal posture: Secondary | ICD-10-CM | POA: Diagnosis not present

## 2017-09-18 DIAGNOSIS — J9620 Acute and chronic respiratory failure, unspecified whether with hypoxia or hypercapnia: Secondary | ICD-10-CM | POA: Diagnosis not present

## 2017-09-21 ENCOUNTER — Telehealth (HOSPITAL_COMMUNITY): Payer: Self-pay | Admitting: *Deleted

## 2017-09-21 DIAGNOSIS — R1312 Dysphagia, oropharyngeal phase: Secondary | ICD-10-CM | POA: Diagnosis not present

## 2017-09-21 DIAGNOSIS — E088 Diabetes mellitus due to underlying condition with unspecified complications: Secondary | ICD-10-CM | POA: Diagnosis not present

## 2017-09-21 DIAGNOSIS — F0150 Vascular dementia without behavioral disturbance: Secondary | ICD-10-CM | POA: Diagnosis not present

## 2017-09-21 DIAGNOSIS — J9620 Acute and chronic respiratory failure, unspecified whether with hypoxia or hypercapnia: Secondary | ICD-10-CM | POA: Diagnosis not present

## 2017-09-21 DIAGNOSIS — K219 Gastro-esophageal reflux disease without esophagitis: Secondary | ICD-10-CM | POA: Diagnosis not present

## 2017-09-21 DIAGNOSIS — R293 Abnormal posture: Secondary | ICD-10-CM | POA: Diagnosis not present

## 2017-09-21 NOTE — Telephone Encounter (Signed)
Received office note from [facility].  Patient's Torsemide increased to 60 mg Daily, limit fluid intake to 1254ml/day, and sodium restriction of less than 2.5gm daily.  Torsemide dosage updated and office note will be scanned to patient's electronic medical record.

## 2017-09-22 DIAGNOSIS — R1312 Dysphagia, oropharyngeal phase: Secondary | ICD-10-CM | POA: Diagnosis not present

## 2017-09-22 DIAGNOSIS — F0150 Vascular dementia without behavioral disturbance: Secondary | ICD-10-CM | POA: Diagnosis not present

## 2017-09-22 DIAGNOSIS — R293 Abnormal posture: Secondary | ICD-10-CM | POA: Diagnosis not present

## 2017-09-22 DIAGNOSIS — K219 Gastro-esophageal reflux disease without esophagitis: Secondary | ICD-10-CM | POA: Diagnosis not present

## 2017-09-22 DIAGNOSIS — E088 Diabetes mellitus due to underlying condition with unspecified complications: Secondary | ICD-10-CM | POA: Diagnosis not present

## 2017-09-22 DIAGNOSIS — J9620 Acute and chronic respiratory failure, unspecified whether with hypoxia or hypercapnia: Secondary | ICD-10-CM | POA: Diagnosis not present

## 2017-09-23 ENCOUNTER — Other Ambulatory Visit: Payer: Self-pay

## 2017-09-23 ENCOUNTER — Emergency Department (HOSPITAL_COMMUNITY): Payer: Medicare Other

## 2017-09-23 ENCOUNTER — Inpatient Hospital Stay (HOSPITAL_COMMUNITY)
Admission: EM | Admit: 2017-09-23 | Discharge: 2017-10-01 | DRG: 280 | Disposition: A | Discharge status: Skilled Nursing Facility | Payer: Medicare Other | Attending: Family Medicine | Admitting: Family Medicine

## 2017-09-23 ENCOUNTER — Encounter (HOSPITAL_COMMUNITY): Payer: Self-pay | Admitting: Emergency Medicine

## 2017-09-23 DIAGNOSIS — I2721 Secondary pulmonary arterial hypertension: Secondary | ICD-10-CM | POA: Diagnosis present

## 2017-09-23 DIAGNOSIS — I82512 Chronic embolism and thrombosis of left femoral vein: Secondary | ICD-10-CM | POA: Diagnosis present

## 2017-09-23 DIAGNOSIS — R404 Transient alteration of awareness: Secondary | ICD-10-CM | POA: Diagnosis not present

## 2017-09-23 DIAGNOSIS — Z794 Long term (current) use of insulin: Secondary | ICD-10-CM | POA: Diagnosis not present

## 2017-09-23 DIAGNOSIS — Z7189 Other specified counseling: Secondary | ICD-10-CM

## 2017-09-23 DIAGNOSIS — J9601 Acute respiratory failure with hypoxia: Secondary | ICD-10-CM | POA: Diagnosis not present

## 2017-09-23 DIAGNOSIS — E1142 Type 2 diabetes mellitus with diabetic polyneuropathy: Secondary | ICD-10-CM | POA: Diagnosis present

## 2017-09-23 DIAGNOSIS — E1152 Type 2 diabetes mellitus with diabetic peripheral angiopathy with gangrene: Secondary | ICD-10-CM | POA: Diagnosis not present

## 2017-09-23 DIAGNOSIS — F0390 Unspecified dementia without behavioral disturbance: Secondary | ICD-10-CM | POA: Diagnosis present

## 2017-09-23 DIAGNOSIS — E440 Moderate protein-calorie malnutrition: Secondary | ICD-10-CM | POA: Diagnosis present

## 2017-09-23 DIAGNOSIS — I2722 Pulmonary hypertension due to left heart disease: Secondary | ICD-10-CM | POA: Diagnosis present

## 2017-09-23 DIAGNOSIS — I214 Non-ST elevation (NSTEMI) myocardial infarction: Secondary | ICD-10-CM | POA: Diagnosis present

## 2017-09-23 DIAGNOSIS — Z9981 Dependence on supplemental oxygen: Secondary | ICD-10-CM

## 2017-09-23 DIAGNOSIS — N183 Chronic kidney disease, stage 3 (moderate): Secondary | ICD-10-CM | POA: Diagnosis not present

## 2017-09-23 DIAGNOSIS — J9691 Respiratory failure, unspecified with hypoxia: Secondary | ICD-10-CM

## 2017-09-23 DIAGNOSIS — Z89612 Acquired absence of left leg above knee: Secondary | ICD-10-CM

## 2017-09-23 DIAGNOSIS — D631 Anemia in chronic kidney disease: Secondary | ICD-10-CM | POA: Diagnosis present

## 2017-09-23 DIAGNOSIS — R0602 Shortness of breath: Secondary | ICD-10-CM

## 2017-09-23 DIAGNOSIS — R0902 Hypoxemia: Secondary | ICD-10-CM | POA: Diagnosis not present

## 2017-09-23 DIAGNOSIS — E872 Acidosis: Secondary | ICD-10-CM | POA: Diagnosis present

## 2017-09-23 DIAGNOSIS — E876 Hypokalemia: Secondary | ICD-10-CM | POA: Diagnosis present

## 2017-09-23 DIAGNOSIS — I509 Heart failure, unspecified: Secondary | ICD-10-CM | POA: Diagnosis not present

## 2017-09-23 DIAGNOSIS — N400 Enlarged prostate without lower urinary tract symptoms: Secondary | ICD-10-CM | POA: Diagnosis present

## 2017-09-23 DIAGNOSIS — I132 Hypertensive heart and chronic kidney disease with heart failure and with stage 5 chronic kidney disease, or end stage renal disease: Secondary | ICD-10-CM | POA: Diagnosis present

## 2017-09-23 DIAGNOSIS — N179 Acute kidney failure, unspecified: Secondary | ICD-10-CM | POA: Diagnosis present

## 2017-09-23 DIAGNOSIS — N185 Chronic kidney disease, stage 5: Secondary | ICD-10-CM | POA: Diagnosis present

## 2017-09-23 DIAGNOSIS — E11319 Type 2 diabetes mellitus with unspecified diabetic retinopathy without macular edema: Secondary | ICD-10-CM | POA: Diagnosis present

## 2017-09-23 DIAGNOSIS — E039 Hypothyroidism, unspecified: Secondary | ICD-10-CM | POA: Diagnosis present

## 2017-09-23 DIAGNOSIS — Z79899 Other long term (current) drug therapy: Secondary | ICD-10-CM

## 2017-09-23 DIAGNOSIS — E1122 Type 2 diabetes mellitus with diabetic chronic kidney disease: Secondary | ICD-10-CM | POA: Diagnosis present

## 2017-09-23 DIAGNOSIS — J449 Chronic obstructive pulmonary disease, unspecified: Secondary | ICD-10-CM | POA: Diagnosis present

## 2017-09-23 DIAGNOSIS — R1310 Dysphagia, unspecified: Secondary | ICD-10-CM | POA: Diagnosis not present

## 2017-09-23 DIAGNOSIS — Z89422 Acquired absence of other left toe(s): Secondary | ICD-10-CM

## 2017-09-23 DIAGNOSIS — R69 Illness, unspecified: Secondary | ICD-10-CM

## 2017-09-23 DIAGNOSIS — I2510 Atherosclerotic heart disease of native coronary artery without angina pectoris: Secondary | ICD-10-CM | POA: Diagnosis present

## 2017-09-23 DIAGNOSIS — Z515 Encounter for palliative care: Secondary | ICD-10-CM | POA: Diagnosis not present

## 2017-09-23 DIAGNOSIS — N189 Chronic kidney disease, unspecified: Secondary | ICD-10-CM

## 2017-09-23 DIAGNOSIS — I5033 Acute on chronic diastolic (congestive) heart failure: Secondary | ICD-10-CM | POA: Diagnosis present

## 2017-09-23 DIAGNOSIS — E1151 Type 2 diabetes mellitus with diabetic peripheral angiopathy without gangrene: Secondary | ICD-10-CM | POA: Diagnosis present

## 2017-09-23 DIAGNOSIS — J9600 Acute respiratory failure, unspecified whether with hypoxia or hypercapnia: Secondary | ICD-10-CM

## 2017-09-23 DIAGNOSIS — Z951 Presence of aortocoronary bypass graft: Secondary | ICD-10-CM | POA: Diagnosis not present

## 2017-09-23 DIAGNOSIS — N184 Chronic kidney disease, stage 4 (severe): Secondary | ICD-10-CM

## 2017-09-23 DIAGNOSIS — J42 Unspecified chronic bronchitis: Secondary | ICD-10-CM

## 2017-09-23 DIAGNOSIS — Z7982 Long term (current) use of aspirin: Secondary | ICD-10-CM

## 2017-09-23 DIAGNOSIS — R8271 Bacteriuria: Secondary | ICD-10-CM | POA: Diagnosis present

## 2017-09-23 DIAGNOSIS — R4020 Unspecified coma: Secondary | ICD-10-CM | POA: Diagnosis not present

## 2017-09-23 DIAGNOSIS — J189 Pneumonia, unspecified organism: Secondary | ICD-10-CM | POA: Diagnosis not present

## 2017-09-23 DIAGNOSIS — Z79890 Hormone replacement therapy (postmenopausal): Secondary | ICD-10-CM

## 2017-09-23 DIAGNOSIS — I159 Secondary hypertension, unspecified: Secondary | ICD-10-CM | POA: Diagnosis present

## 2017-09-23 DIAGNOSIS — J9621 Acute and chronic respiratory failure with hypoxia: Secondary | ICD-10-CM | POA: Diagnosis present

## 2017-09-23 DIAGNOSIS — Z880 Allergy status to penicillin: Secondary | ICD-10-CM

## 2017-09-23 DIAGNOSIS — Z7401 Bed confinement status: Secondary | ICD-10-CM | POA: Diagnosis not present

## 2017-09-23 DIAGNOSIS — I110 Hypertensive heart disease with heart failure: Secondary | ICD-10-CM | POA: Diagnosis not present

## 2017-09-23 DIAGNOSIS — M2550 Pain in unspecified joint: Secondary | ICD-10-CM | POA: Diagnosis not present

## 2017-09-23 DIAGNOSIS — E782 Mixed hyperlipidemia: Secondary | ICD-10-CM | POA: Diagnosis present

## 2017-09-23 DIAGNOSIS — I739 Peripheral vascular disease, unspecified: Secondary | ICD-10-CM | POA: Diagnosis not present

## 2017-09-23 DIAGNOSIS — Z959 Presence of cardiac and vascular implant and graft, unspecified: Secondary | ICD-10-CM

## 2017-09-23 DIAGNOSIS — Z89421 Acquired absence of other right toe(s): Secondary | ICD-10-CM

## 2017-09-23 HISTORY — DX: Respiratory failure, unspecified with hypoxia: J96.91

## 2017-09-23 LAB — CBC WITH DIFFERENTIAL/PLATELET
Basophils Absolute: 0 10*3/uL (ref 0.0–0.1)
Basophils Relative: 0 %
Eosinophils Absolute: 0 10*3/uL (ref 0.0–0.7)
Eosinophils Relative: 0 %
HCT: 25.7 % — ABNORMAL LOW (ref 39.0–52.0)
Hemoglobin: 7.3 g/dL — ABNORMAL LOW (ref 13.0–17.0)
Lymphocytes Relative: 10 %
Lymphs Abs: 0.7 10*3/uL (ref 0.7–4.0)
MCH: 26.4 pg (ref 26.0–34.0)
MCHC: 28.4 g/dL — ABNORMAL LOW (ref 30.0–36.0)
MCV: 92.8 fL (ref 78.0–100.0)
Monocytes Absolute: 0.5 10*3/uL (ref 0.1–1.0)
Monocytes Relative: 7 %
Neutro Abs: 5.7 10*3/uL (ref 1.7–7.7)
Neutrophils Relative %: 83 %
Platelets: UNDETERMINED 10*3/uL (ref 150–400)
RBC: 2.77 MIL/uL — ABNORMAL LOW (ref 4.22–5.81)
RDW: 16.1 % — ABNORMAL HIGH (ref 11.5–15.5)
WBC: 6.9 10*3/uL (ref 4.0–10.5)

## 2017-09-23 LAB — I-STAT CHEM 8, ED
BUN: 33 mg/dL — ABNORMAL HIGH (ref 8–23)
Calcium, Ion: 1.11 mmol/L — ABNORMAL LOW (ref 1.15–1.40)
Chloride: 110 mmol/L (ref 98–111)
Creatinine, Ser: 2 mg/dL — ABNORMAL HIGH (ref 0.61–1.24)
Glucose, Bld: 220 mg/dL — ABNORMAL HIGH (ref 70–99)
HCT: 23 % — ABNORMAL LOW (ref 39.0–52.0)
Hemoglobin: 7.8 g/dL — ABNORMAL LOW (ref 13.0–17.0)
Potassium: 3.6 mmol/L (ref 3.5–5.1)
Sodium: 145 mmol/L (ref 135–145)
TCO2: 19 mmol/L — ABNORMAL LOW (ref 22–32)

## 2017-09-23 LAB — I-STAT CG4 LACTIC ACID, ED
Lactic Acid, Venous: 3.93 mmol/L (ref 0.5–1.9)
Lactic Acid, Venous: 1.88 mmol/L (ref 0.5–1.9)

## 2017-09-23 LAB — TROPONIN I
TROPONIN I: 3.77 ng/mL — AB (ref <0.03)
TROPONIN I: 3.98 ng/mL — AB (ref <0.03)

## 2017-09-23 LAB — I-STAT ARTERIAL BLOOD GAS, ED
Acid-base deficit: 6 mmol/L — ABNORMAL HIGH (ref 0.0–2.0)
Bicarbonate: 19.1 mmol/L — ABNORMAL LOW (ref 20.0–28.0)
O2 Saturation: 99 %
TCO2: 20 mmol/L — AB (ref 22–32)
pCO2 arterial: 34.3 mmHg (ref 32.0–48.0)
pH, Arterial: 7.352 (ref 7.350–7.450)
pO2, Arterial: 163 mmHg — ABNORMAL HIGH (ref 83.0–108.0)

## 2017-09-23 LAB — COMPREHENSIVE METABOLIC PANEL
ALT: 23 U/L (ref 0–44)
AST: 57 U/L — ABNORMAL HIGH (ref 15–41)
Albumin: 2.6 g/dL — ABNORMAL LOW (ref 3.5–5.0)
Alkaline Phosphatase: 145 U/L — ABNORMAL HIGH (ref 38–126)
Anion gap: 17 — ABNORMAL HIGH (ref 5–15)
BUN: 33 mg/dL — ABNORMAL HIGH (ref 8–23)
CO2: 18 mmol/L — ABNORMAL LOW (ref 22–32)
Calcium: 8.2 mg/dL — ABNORMAL LOW (ref 8.9–10.3)
Chloride: 111 mmol/L (ref 98–111)
Creatinine, Ser: 2.31 mg/dL — ABNORMAL HIGH (ref 0.61–1.24)
GFR calc Af Amer: 32 mL/min — ABNORMAL LOW (ref >60)
GFR calc non Af Amer: 28 mL/min — ABNORMAL LOW (ref >60)
Glucose, Bld: 227 mg/dL — ABNORMAL HIGH (ref 70–99)
Potassium: 3.7 mmol/L (ref 3.5–5.1)
Sodium: 146 mmol/L — ABNORMAL HIGH (ref 135–145)
Total Bilirubin: 1.4 mg/dL — ABNORMAL HIGH (ref 0.3–1.2)
Total Protein: 5.6 g/dL — ABNORMAL LOW (ref 6.5–8.1)

## 2017-09-23 LAB — RAPID URINE DRUG SCREEN, HOSP PERFORMED
Amphetamines: NOT DETECTED
Barbiturates: NOT DETECTED
Benzodiazepines: NOT DETECTED
Cocaine: NOT DETECTED
OPIATES: NOT DETECTED
Tetrahydrocannabinol: NOT DETECTED

## 2017-09-23 LAB — URINALYSIS, ROUTINE W REFLEX MICROSCOPIC
Bilirubin Urine: NEGATIVE
GLUCOSE, UA: NEGATIVE mg/dL
KETONES UR: NEGATIVE mg/dL
Nitrite: NEGATIVE
Protein, ur: >=300 mg/dL — AB
Specific Gravity, Urine: 1.012 (ref 1.005–1.030)
pH: 5 (ref 5.0–8.0)

## 2017-09-23 LAB — CBG MONITORING, ED: Glucose-Capillary: 174 mg/dL — ABNORMAL HIGH (ref 70–99)

## 2017-09-23 LAB — BRAIN NATRIURETIC PEPTIDE: B Natriuretic Peptide: 1815.8 pg/mL — ABNORMAL HIGH (ref 0.0–100.0)

## 2017-09-23 LAB — I-STAT TROPONIN, ED: Troponin i, poc: 2.11 ng/mL (ref 0.00–0.08)

## 2017-09-23 LAB — LACTIC ACID, PLASMA
Lactic Acid, Venous: 1.9 mmol/L (ref 0.5–1.9)
Lactic Acid, Venous: 1.5 mmol/L (ref 0.5–1.9)

## 2017-09-23 LAB — GLUCOSE, CAPILLARY
GLUCOSE-CAPILLARY: 159 mg/dL — AB (ref 70–99)
GLUCOSE-CAPILLARY: 148 mg/dL — AB (ref 70–99)

## 2017-09-23 LAB — AMMONIA: Ammonia: 10 umol/L (ref 9–35)

## 2017-09-23 LAB — PROTIME-INR
INR: 1.38
Prothrombin Time: 16.9 seconds — ABNORMAL HIGH (ref 11.4–15.2)

## 2017-09-23 LAB — MRSA PCR SCREENING: MRSA by PCR: NEGATIVE

## 2017-09-23 LAB — HEPARIN LEVEL (UNFRACTIONATED): HEPARIN UNFRACTIONATED: 0.16 [IU]/mL — AB (ref 0.30–0.70)

## 2017-09-23 MED ORDER — POTASSIUM CHLORIDE CRYS ER 20 MEQ PO TBCR: 40.0000 meq | EXTENDED_RELEASE_TABLET | Freq: Once | ORAL | Status: DC

## 2017-09-23 MED ORDER — ALBUTEROL (5 MG/ML) CONTINUOUS INHALATION SOLN
10.0000 mg/h | INHALATION_SOLUTION | RESPIRATORY_TRACT | Status: DC
  Administered 2017-09-23: 10 mg/h via RESPIRATORY_TRACT
  Filled 2017-09-23: qty 20

## 2017-09-23 MED ORDER — DOXAZOSIN MESYLATE 2 MG PO TABS
8.0000 mg | ORAL_TABLET | Freq: Every evening | ORAL | Status: DC
  Administered 2017-09-24 – 2017-10-01 (×8): 8 mg via ORAL
  Filled 2017-09-23 (×8): qty 4

## 2017-09-23 MED ORDER — INSULIN ASPART 100 UNIT/ML ~~LOC~~ SOLN
0.0000 [IU] | Freq: Three times a day (TID) | SUBCUTANEOUS | Status: DC
  Administered 2017-09-23 – 2017-09-24 (×2): 2 [IU] via SUBCUTANEOUS
  Administered 2017-09-24 – 2017-09-25 (×4): 1 [IU] via SUBCUTANEOUS
  Administered 2017-09-26 – 2017-09-28 (×5): 2 [IU] via SUBCUTANEOUS
  Administered 2017-09-28: 1 [IU] via SUBCUTANEOUS
  Administered 2017-09-29 (×2): 2 [IU] via SUBCUTANEOUS
  Administered 2017-09-29 – 2017-09-30 (×2): 1 [IU] via SUBCUTANEOUS
  Administered 2017-09-30 (×2): 2 [IU] via SUBCUTANEOUS
  Administered 2017-10-01: 1 [IU] via SUBCUTANEOUS
  Administered 2017-10-01: 3 [IU] via SUBCUTANEOUS

## 2017-09-23 MED ORDER — GLYCOPYRROLATE 25 MCG/ML IN SOLN: 1.0000 mL | Freq: Two times a day (BID) | RESPIRATORY_TRACT | Status: DC

## 2017-09-23 MED ORDER — IPRATROPIUM-ALBUTEROL 0.5-2.5 (3) MG/3ML IN SOLN: 3.0000 mL | Freq: Four times a day (QID) | RESPIRATORY_TRACT | Status: DC | PRN

## 2017-09-23 MED ORDER — ASPIRIN 300 MG RE SUPP
300.0000 mg | Freq: Once | RECTAL | Status: AC
  Administered 2017-09-23: 300 mg via RECTAL
  Filled 2017-09-23: qty 1

## 2017-09-23 MED ORDER — HYDRALAZINE HCL 100 MG PO TABS: 100.0000 mg | ORAL_TABLET | Freq: Three times a day (TID) | ORAL | Status: DC

## 2017-09-23 MED ORDER — HYDRALAZINE HCL 50 MG PO TABS
100.0000 mg | ORAL_TABLET | Freq: Three times a day (TID) | ORAL | Status: DC
  Administered 2017-09-24 – 2017-10-01 (×23): 100 mg via ORAL
  Filled 2017-09-23 (×24): qty 2

## 2017-09-23 MED ORDER — HEPARIN BOLUS VIA INFUSION
2000.0000 [IU] | Freq: Once | INTRAVENOUS | Status: AC
  Administered 2017-09-23: 2000 [IU] via INTRAVENOUS
  Filled 2017-09-23: qty 2000

## 2017-09-23 MED ORDER — POLYETHYLENE GLYCOL 3350 17 G PO PACK: 17.0000 g | PACK | Freq: Every day | ORAL | Status: DC | PRN

## 2017-09-23 MED ORDER — PANTOPRAZOLE SODIUM 40 MG PO TBEC
40.0000 mg | DELAYED_RELEASE_TABLET | Freq: Every day | ORAL | Status: DC
  Administered 2017-09-24 – 2017-09-25 (×2): 40 mg via ORAL
  Filled 2017-09-23 (×2): qty 1

## 2017-09-23 MED ORDER — ISOSORBIDE MONONITRATE ER 60 MG PO TB24
60.0000 mg | ORAL_TABLET | Freq: Every day | ORAL | Status: DC
  Administered 2017-09-24: 60 mg via ORAL
  Filled 2017-09-23: qty 1

## 2017-09-23 MED ORDER — ASPIRIN 81 MG PO CHEW
81.0000 mg | CHEWABLE_TABLET | Freq: Every day | ORAL | Status: DC
  Administered 2017-09-24 – 2017-10-01 (×8): 81 mg via ORAL
  Filled 2017-09-23 (×8): qty 1

## 2017-09-23 MED ORDER — FERROUS SULFATE 325 (65 FE) MG PO TABS
325.0000 mg | ORAL_TABLET | Freq: Every day | ORAL | Status: DC
  Administered 2017-09-24 – 2017-10-01 (×8): 325 mg via ORAL
  Filled 2017-09-23 (×9): qty 1

## 2017-09-23 MED ORDER — NITROGLYCERIN 0.4 MG SL SUBL: 0.4000 mg | SUBLINGUAL_TABLET | SUBLINGUAL | Status: DC | PRN

## 2017-09-23 MED ORDER — IPRATROPIUM BROMIDE 0.02 % IN SOLN
1.0000 mg | Freq: Once | RESPIRATORY_TRACT | Status: AC
  Administered 2017-09-23: 08:00:00 via RESPIRATORY_TRACT
  Administered 2017-09-23: 1 mg via RESPIRATORY_TRACT
  Filled 2017-09-23: qty 5

## 2017-09-23 MED ORDER — ATORVASTATIN CALCIUM 40 MG PO TABS
40.0000 mg | ORAL_TABLET | Freq: Every day | ORAL | Status: DC
  Administered 2017-09-24 – 2017-10-01 (×8): 40 mg via ORAL
  Filled 2017-09-23 (×8): qty 1

## 2017-09-23 MED ORDER — LEVOTHYROXINE SODIUM 100 MCG PO TABS
100.0000 ug | ORAL_TABLET | Freq: Every day | ORAL | Status: DC
  Administered 2017-09-24 – 2017-10-01 (×8): 100 ug via ORAL
  Filled 2017-09-23 (×8): qty 1

## 2017-09-23 MED ORDER — HEPARIN BOLUS VIA INFUSION
4000.0000 [IU] | Freq: Once | INTRAVENOUS | Status: AC
  Administered 2017-09-23: 4000 [IU] via INTRAVENOUS
  Filled 2017-09-23: qty 4000

## 2017-09-23 MED ORDER — CLONIDINE HCL 0.2 MG/24HR TD PTWK
0.2000 mg | MEDICATED_PATCH | TRANSDERMAL | Status: DC
  Administered 2017-09-28: 0.2 mg via TRANSDERMAL
  Filled 2017-09-23: qty 1

## 2017-09-23 MED ORDER — FUROSEMIDE 10 MG/ML IJ SOLN
60.0000 mg | Freq: Once | INTRAMUSCULAR | Status: DC
  Filled 2017-09-23: qty 6

## 2017-09-23 MED ORDER — HYDRALAZINE HCL 20 MG/ML IJ SOLN: 5.0000 mg | INTRAMUSCULAR | Status: DC | PRN

## 2017-09-23 MED ORDER — IPRATROPIUM BROMIDE 0.02 % IN SOLN
RESPIRATORY_TRACT | Status: AC
  Filled 2017-09-23: qty 2.5

## 2017-09-23 MED ORDER — HEPARIN (PORCINE) IN NACL 100-0.45 UNIT/ML-% IJ SOLN
1300.0000 [IU]/h | INTRAMUSCULAR | Status: DC
  Administered 2017-09-23: 900 [IU]/h via INTRAVENOUS
  Administered 2017-09-24: 1200 [IU]/h via INTRAVENOUS
  Filled 2017-09-23 (×2): qty 250

## 2017-09-23 MED ORDER — POTASSIUM CHLORIDE 10 MEQ/100ML IV SOLN
10.0000 meq | INTRAVENOUS | Status: AC
  Administered 2017-09-23 (×4): 10 meq via INTRAVENOUS
  Filled 2017-09-23 (×4): qty 100

## 2017-09-23 MED ORDER — ACETAMINOPHEN 325 MG PO TABS: 650.0000 mg | ORAL_TABLET | Freq: Four times a day (QID) | ORAL | Status: DC | PRN

## 2017-09-23 MED ORDER — ACETAMINOPHEN 650 MG RE SUPP: 650.0000 mg | Freq: Four times a day (QID) | RECTAL | Status: DC | PRN

## 2017-09-23 MED ORDER — FUROSEMIDE 10 MG/ML IJ SOLN
80.0000 mg | Freq: Two times a day (BID) | INTRAMUSCULAR | Status: DC
  Administered 2017-09-23 – 2017-09-24 (×4): 80 mg via INTRAVENOUS
  Filled 2017-09-23 (×4): qty 8

## 2017-09-23 NOTE — ED Provider Notes (Signed)
[facility] EMERGENCY DEPARTMENT Provider Note   CSN: [id number] Arrival date & time: 09/23/17  6256     History   Chief Complaint Chief Complaint  Patient presents with  . Altered Mental Status    HPI Leonard [name] is a 67 y.o. male.  HPI Patient is a poor historian.  He answers questions with limited 1 or 2 word responses.  He denies any pain, specifically including chest pain.  He is oriented to person and the year.  Pt arrives via [facility] from a nursing facility, pt was found unresponsive with yellow froth at the mouth. O2 sats initially in the 70s on RA, ems assisted with ventilations, O2 sats improved and pt placed on NRB with sats maintaining in the high 90's. Pt arousable to verbal stimuli at this time cbg 270, bp 128/76, HR 110. His medical history is significant for Diastolic heart failure, CAD status post CABG, HLD, COPD, CKD stage IV, T2 DM, hypertension, hypothyroidism. Past Medical History:  Diagnosis Date  . Acute on chronic diastolic heart failure ([place]) 07/04/2014  . Anemia of renal disease   . Atherosclerotic peripheral vascular disease with gangrene ([place]) 02/14/2017  . CAD (coronary artery disease) 04/06/2013  . Cancer ([place])    skin cancer-  . CHF (congestive heart failure) ([place])   . Chronic anemia 02/28/2016  . Chronic deep vein thrombosis (DVT) of femoral vein of left lower extremity ([place]) 09/03/2016  . Chronic respiratory failure with hypoxia ([place]) 08/25/2017  . CKD (chronic kidney disease) stage 4, GFR 15-29 ml/min (HCC) 01/15/2015   Follows with [facility].  Last visits:  07/23/16: No change in meds. Worried that if we increase diuretics could lead to another AKI  . CKD (chronic kidney disease), stage IV ([place])    07/07/2017  . Claudication ([place])   . Collapse of left talus 01/14/2017  . COPD GOLD III  02/16/2014   Quit smoking 2014  - PFT's  04/06/13   FEV1 1.28 (44 % ) ratio 48  p no % improvement from saba p ?  prior to study with DLCO  43/54  % corrects to 97  % for alv volume - 01/31/2016  After extensive coaching HFA effectiveness =    75% from baseline 0 > changed to sprivia respimat   - PFT's  03/17/2016  FEV1 2.25  (81 % ) ratio 88  p 2 % improvement from saba p spiriva respimat x 2  prior to study with  . Coronary artery disease   . Diabetes ([place])   . Diabetic polyneuropathy associated with type 2 diabetes mellitus ([place]) 01/14/2017  . Diabetic retinopathy ([place]) 11/13/2015   Per Dr. Erenest Rasher eye exam in 10/17/15, mild background diabetic retinopathy  . Fatigue associated with anemia 01/21/2016  . Gangrene of toe ([place])    right 5th/notes 01/04/2013   . HCAP (healthcare-associated pneumonia)   . Hematuria 06/27/2015  . High cholesterol   . History of tobacco use 08/23/2013   Smoked pack per day for 46 years Quit smoking - 02/03/2013   . Hypertension   . Hypothyroidism 11/22/2014  . Idiopathic chronic gout of left foot without tophus   . Iron deficiency anemia 08/03/2015  . Left foot pain 10/29/2016  . Long term (current) use of anticoagulants [Z79.01] 09/09/2016  . 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 Mixed hyperlipidemia 07/17/2008  . Morbid obesity due to excess calories ([place]) 03/17/2016  . PAD (peripheral artery disease) ([place])   . Pleural  effusion   . Pneumonitis 07/26/2017  . Pulmonary artery hypertension ([place]) 07/25/2017  . PVD - hx of Rt SFA PTA and s/p Rt 4-5th toe amp Dec 2014 04/05/2013  . 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 Respiratory failure ([place]) 08/13/2017  . 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 Secondary hypertension   . Shortness of breath dyspnea    with exertion  . Skin lesion of face 02/22/2015  . SOB (shortness of breath)   . Type 2 diabetes mellitus with pressure callus ([place]) 12/29/2016  . Type II diabetes mellitus (HCC)    Type II  . Type II diabetes mellitus with complication [facility])     Patient Active Problem List   Diagnosis Date Noted  . Respiratory failure with hypoxia ([place])  09/23/2017  . Moderate malnutrition ([place]) 08/27/2017  . Hypoxemia   . Atelectasis   . HCAP (healthcare-associated pneumonia)   . Chronic respiratory failure with hypoxia ([place]) 08/25/2017  . Pulmonary artery hypertension ([place]) 07/25/2017  . Pressure injury of skin 07/20/2017  . Diabetic polyneuropathy associated with type 2 diabetes mellitus ([place]) 01/14/2017  . Pleural effusion   . Chronic anemia 02/28/2016  . Diabetic retinopathy ([place]) 11/13/2015  . Iron deficiency anemia 08/03/2015  . Type II diabetes mellitus with complication ([place])   . 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 Deficiency anemia 01/15/2015  . CKD (chronic kidney disease) stage 4, GFR 15-29 ml/min (HCC) 01/15/2015  . Hypothyroidism 11/22/2014  . Acute on chronic diastolic heart failure ([place]) 07/04/2014  . COPD GOLD III  02/16/2014  . Acute respiratory failure with hypoxia ([place])   . Anemia of renal disease   . History of tobacco use 08/23/2013  . 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 CAD (coronary artery disease) 04/06/2013  . PVD - hx of Rt SFA PTA and s/p Rt 4-5th toe amp Dec 2014 04/05/2013  . 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 Mixed hyperlipidemia 07/17/2008    Past Surgical History:  Procedure Laterality Date  . ABDOMINAL AORTOGRAM W/LOWER EXTREMITY N/A 03/12/2017   Procedure: ABDOMINAL AORTOGRAM W/LOWER EXTREMITY;  Surgeon: Conrad Burket, MD;  Location: [facility];  Service: Cardiovascular;  Laterality: N/A;  . AMPUTATION Right 01/28/2013   Procedure: RAY AMPUTATION RIGHT  4th & 5th TOE;  Surgeon: Rosetta Posner, MD;  Location: [facility];  Service: Vascular;  Laterality: Right;  . AMPUTATION Left 03/15/2017   Procedure: AMPUTATION ABOVE KNEE;  Surgeon: Waynetta Sandy, MD;  Location: [facility];  Service: Vascular;  Laterality: Left;  . ANGIOPLASTY / STENTING FEMORAL Right 01/13/2013   superficial femoral artery x 2 (3 mm x 60 mm, 4 mm x 60 mm)  . AV FISTULA PLACEMENT Left 02/11/2016    Procedure: LEFT UPPER ARM BRACHIAL CEPHALIC ARTERIOVENOUS (AV) FISTULA CREATION;  Surgeon: Conrad [place], MD;  Location: [facility];  Service: Vascular;  Laterality: Left;  . COLONOSCOPY W/ POLYPECTOMY    . CORONARY ARTERY BYPASS GRAFT N/A 04/07/2013   Procedure: CORONARY ARTERY BYPASS GRAFTING (CABG);  Surgeon: Ivin Poot, MD;  Location: [facility];  Service: Open Heart Surgery;  Laterality: N/A;  . INTRAOPERATIVE TRANSESOPHAGEAL ECHOCARDIOGRAM N/A 04/07/2013   Procedure: INTRAOPERATIVE TRANSESOPHAGEAL ECHOCARDIOGRAM;  Surgeon: Ivin Poot, MD;  Location: [facility];  Service: Open Heart Surgery;  Laterality: N/A;  . LEFT HEART CATHETERIZATION WITH CORONARY ANGIOGRAM N/A 04/05/2013   Procedure: LEFT HEART CATHETERIZATION WITH CORONARY ANGIOGRAM;  Surgeon: Peter M Martinique, MD;  Location[facility] CATH LAB;  Service:  Cardiovascular;  Laterality: N/A;  . LOWER EXTREMITY ANGIOGRAM Bilateral 01/06/2013   Procedure: LOWER EXTREMITY ANGIOGRAM;  Surgeon: Conrad [place], MD;  Location[facility] C[facility] LAB;  Service: Cardiovascular;  Laterality: Bilateral;  . LOWER EXTREMITY ANGIOGRAM Right 01/13/2013   Procedure: LOWER EXTREMITY ANGIOGRAM;  Surgeon: Conrad [place], MD;  Location: [facility] CATH LAB;  Service: Cardiovascular;  Laterality: Right;  . SKIN CANCER EXCISION  2017  . THROAT SURGERY  1983   polyps  . TONSILLECTOMY AND ADENOIDECTOMY  ~ 1965        Home Medications    Prior to Admission medications   Medication Sig Start Date End Date Taking? Authorizing Provider  acetaminophen (TYLENOL) 325 MG tablet Take 650 mg by mouth every 8 (eight) hours as needed for mild pain or fever.    Yes [provider]  aspirin EC 81 MG tablet Take 1 tablet (81 mg total) by mouth daily. 08/07/17 08/07/18 Yes Riccio, Angela C, DO  atorvastatin (LIPITOR) 40 MG tablet TAKE ONE (1) TABLET BY MOUTH EVERY DAY AT 6:00PM Patient taking differently: Take 40 mg by mouth daily at 6 PM.  11/17/16  Yes Larey Dresser, MD  carvedilol (COREG) 12.5 MG  tablet Take 1 tablet (12.5 mg total) by mouth 2 (two) times daily with a meal. 08/30/17  Yes Meccariello, Bernita Raisin, DO  cloNIDine (CATAPRES - DOSED IN MG/24 HR) 0.2 mg/24hr patch Place 1 patch (0.2 mg total) onto the skin once a week. Patient taking differently: Place 0.2 mg onto the skin every Monday.  08/09/17  Yes Steve Rattler, DO  Darbepoetin Alfa (ARANESP) 40 MCG/0.4ML SOSY injection Inject 0.4 mLs (40 mcg total) into the skin every 30 (thirty) days. 09/28/17  Yes Meccariello, Bernita Raisin, DO  doxazosin (CARDURA) 8 MG tablet Take 1 tablet (8 mg total) by mouth every evening. 12/01/16  Yes Hensel, Jamal Collin, MD  feeding supplement, ENSURE ENLIVE, (ENSURE ENLIVE) LIQD Take 237 mLs by mouth 3 (three) times daily between meals. 08/07/17  Yes Riccio, Angela C, DO  ferrous sulfate (FEROSUL) 325 (65 FE) MG tablet Take 1 tablet (325 mg total) by mouth daily at 12 noon. 08/31/17  Yes Meccariello, Bernita Raisin, DO  hydrALAZINE (APRESOLINE) 100 MG tablet TAKE ONE TABLET BY MOUTH EVERY EIGHT HOURS Patient taking differently: Take 100 mg by mouth every 8 (eight) hours.  11/14/16  Yes [name], MD  insulin aspart (NOVOLOG FLEXPEN) 100 UNIT/ML FlexPen Inject 15 Units 2 (two) times daily before lunch and supper into the skin. Patient taking differently: Inject 0-15 Units into the skin See admin instructions. Inject as per sliding scale: 0-150= 0 units 151-400=15 units Call PCP if CBG<70 or >401 12/15/16  Yes Hensel, Jamal Collin, MD  insulin glargine (LANTUS) 100 UNIT/ML injection Inject 24 Units into the skin at bedtime.   Yes [provider]  ipratropium-albuterol (DUONEB) 0.5-2.5 (3) MG/3ML SOLN Take 3 mLs by nebulization 4 (four) times daily as needed (shortness of breath and wheezing).    Yes [provider]  isosorbide mononitrate (IMDUR) 60 MG 24 hr tablet TAKE ONE (1) TABLET BY MOUTH EVERY DAY Patient taking differently: Take 60 mg by mouth daily.  01/14/17  Yes Bensimhon, Shaune Pascal, MD   levothyroxine (SYNTHROID, LEVOTHROID) 100 MCG tablet Take 100 mcg by mouth daily before breakfast.    Yes [provider]  LONHALA MAGNAIR STARTER KIT 25 MCG/ML SOLN Inhale 1 mL into the lungs 2 (two) times daily. 09/21/17  Yes [provider]  nitroGLYCERIN (NITROSTAT) 0.4 MG SL tablet Place 1 tablet (0.4 mg total) under the tongue every 5 (five) minutes as needed for chest pain. 11/14/16  Yes [name], MD  pantoprazole (PROTONIX) 40 MG tablet Take 40 mg by mouth daily.   Yes [provider]  polyethylene glycol (MIRALAX / GLYCOLAX) packet Take 17 g by mouth daily as needed for mild constipation or moderate constipation. Patient taking differently: Take 17 g by mouth every 8 (eight) hours as needed for mild constipation.  08/07/17  Yes Riccio, Angela C, DO  torsemide (DEMADEX) 20 MG tablet Take 60 mg by mouth daily.   Yes [provider]  Blood Glucose Monitoring Suppl ([place]) w/Device KIT 1 kit by Does not apply route daily. 10/10/16   McDiarmid, Blane Ohara, MD  glucose blood (ONETOUCH VERIO) test strip Use as instructed to test three times daily. ICD-10 code: E11.22 10/15/16   McDiarmid, Blane Ohara, MD  [facility] DELICA LANCETS 16R MISC 1 each by Does not apply route 3 (three) times daily. ICD-10 code: E11.22 10/15/16   McDiarmid, Blane Ohara, MD    Family History Family History  Problem Relation Age of Onset  . Cancer Mother        lung cancer  . Hypertension Mother   . Cancer Sister        patient thinks it was uterine cancer  . Hypertension Sister   . Heart attack Sister   . Stroke Neg Hx     Social History Social History   Tobacco Use  . Smoking status: Former Smoker    Packs/day: 1.00    Years: 46.00    Pack years: 46.00    Types: Cigarettes    Start date: 02/04/1967    Last attempt to quit: 02/03/2013    Years since quitting: 4.6  . Smokeless tobacco: Never Used  Substance Use Topics  . Alcohol use: No    Alcohol/week: 0.0  standard drinks  . Drug use: No     Allergies   Penicillins   Review of Systems Review of Systems Cannot obtain review of systems level 5 caveat patient confusion and somnolence.  Physical Exam Updated Vital Signs BP (!) 156/81   Pulse (!) 105   Temp 97.8 F (36.6 C) (Temporal)   Resp (!) 21   Ht '5\' 8"'  (1.727 m)   Wt 73 kg   SpO2 97%   BMI 24.47 kg/m   Physical Exam  Constitutional:  Patient is slightly somnolent.  He does awaken to light stimulus and will interact.  He drifts back asleep when he is not being stimulated.  Moderate increased work of breathing.  Patient pale in appearance.  HENT:  Mouth/Throat: Oropharynx is clear and moist.  Eyes: Pupils are equal, round, and reactive to light. EOM are normal.  Neck: Neck supple.  Cardiovascular:  Heart borderline tachycardia no gross rub murmur gallop.  Pulmonary/Chest:  Moderate increased work of breathing.  Crackles bilateral mid lung fields to bases.  Abdominal: Soft. Bowel sounds are normal. He exhibits no distension. There is no tenderness. There is no guarding.  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  Trace edema of the right lower extremity.  Calf is soft and nontender.  No wounds, erythema or appearance of cellulitis.  Neurological:  Patient is somnolent.  He awakens to light stimulus.  He answer simple questions.  When asked [facility] he [facility] he responded [facility]?",  He correctly  identified the year.  He can move all extremities to commands but is generally weak.  Skin: Skin is warm and dry. There is pallor.     ED Treatments / Results  Labs (all labs ordered are listed, but only abnormal results are displayed) Labs Reviewed  COMPREHENSIVE METABOLIC PANEL - Abnormal; Notable for the following components:      Result Value   Sodium 146 (*)    CO2 18 (*)    Glucose, Bld 227 (*)    BUN 33 (*)    Creatinine, Ser 2.31 (*)    Calcium 8.2 (*)    Total Protein 5.6 (*)    Albumin  2.6 (*)    AST 57 (*)    Alkaline Phosphatase 145 (*)    Total Bilirubin 1.4 (*)    GFR calc non Af Amer 28 (*)    GFR calc Af Amer 32 (*)    Anion gap 17 (*)    All other components within normal limits  BRAIN NATRIURETIC PEPTIDE - Abnormal; Notable for the following components:   B Natriuretic Peptide 1,815.8 (*)    All other components within normal limits  CBC WITH DIFFERENTIAL/PLATELET - Abnormal; Notable for the following components:   RBC 2.77 (*)    Hemoglobin 7.3 (*)    HCT 25.7 (*)    MCHC 28.4 (*)    RDW 16.1 (*)    All other components within normal limits  PROTIME-INR - Abnormal; Notable for the following components:   Prothrombin Time 16.9 (*)    All other components within normal limits  I-STAT CHEM 8, ED - Abnormal; Notable for the following components:   BUN 33 (*)    Creatinine, Ser 2.00 (*)    Glucose, Bld 220 (*)    Calcium, Ion 1.11 (*)    TCO2 19 (*)    Hemoglobin 7.8 (*)    HCT 23.0 (*)    All other components within normal limits  I-STAT CG4 LACTIC ACID, ED - Abnormal; Notable for the following components:   Lactic Acid, Venous 3.93 (*)    All other components within normal limits  I-STAT TROPONIN, ED - Abnormal; Notable for the following components:   Troponin i, poc 2.11 (*)    All other components within normal limits  I-STAT ARTERIAL BLOOD GAS, ED - Abnormal; Notable for the following components:   pO2, Arterial 163.0 (*)    Bicarbonate 19.1 (*)    TCO2 20 (*)    Acid-base deficit 6.0 (*)    All other components within normal limits  CBG MONITORING, ED - Abnormal; Notable for the following components:   Glucose-Capillary 174 (*)    All other components within normal limits  URINE CULTURE  CULTURE, BLOOD (ROUTINE X 2)  CULTURE, BLOOD (ROUTINE X 2)  AMMONIA  BLOOD GAS, ARTERIAL  URINALYSIS, ROUTINE W REFLEX MICROSCOPIC  TROPONIN I  TROPONIN I  RAPID URINE DRUG SCREEN, HOSP PERFORMED  HEPARIN LEVEL (UNFRACTIONATED)  I-STAT CG4 LACTIC  ACID, ED  CBG MONITORING, ED    EKG EKG Interpretation  Date/Time:  Wednesday September 23 2017 07:36:06 EDT Ventricular Rate:  111 PR Interval:    QRS Duration: 104 QT Interval:  364 QTC Calculation: 495 R Axis:   46 Text Interpretation:  Sinus tachycardia Borderline repolarization abnormality Borderline prolonged QT interval agree. no sig change from previous except rate. Confirmed by Charlesetta Shanks [phone]) on 09/23/2017 8:24:07 AM   Radiology Ct Head Wo Contrast  Result Date: 09/23/2017 CLINICAL  DATA:  Altered level of consciousness. EXAM: CT HEAD WITHOUT CONTRAST TECHNIQUE: Contiguous axial images were obtained from the base of the skull through the vertex without intravenous contrast. COMPARISON:  08/05/2017 FINDINGS: Brain: No evidence of acute infarction, hemorrhage, extra-axial collection, ventriculomegaly, or mass effect. Generalized cerebral atrophy. Periventricular white matter low attenuation likely secondary to microangiopathy. Vascular: Cerebrovascular atherosclerotic calcifications are noted. Skull: Negative for fracture or focal lesion. Sinuses/Orbits: Visualized portions of the orbits are unremarkable. Mild left mastoid effusion. Bilateral maxillary sinus mucosal thickening. Other: Periapical cyst of maxillary central incisor. IMPRESSION: 1. No acute intracranial pathology. 2. Chronic microvascular disease and cerebral atrophy. Electronically Signed   By: Kathreen Devoid   On: 09/23/2017 10:30   Dg Chest Port 1 View  Result Date: 09/23/2017 CLINICAL DATA:  Patient found unresponsive at nursing home. EXAM: PORTABLE CHEST 1 VIEW COMPARISON:  08/30/2017 FINDINGS: Sternotomy wires are present. Lungs are adequately inflated with hazy bilateral perihilar opacification likely moderate interstitial edema. No definite effusion. Stable cardiomegaly. Remainder of the exam is unchanged. IMPRESSION: Hazy bilateral perihilar opacification likely interstitial edema with stable cardiomegaly.  Electronically Signed   By: Marin Olp M.D.   On: 09/23/2017 08:18    Procedures Procedures (including critical care time) CRITICAL CARE Performed by: Charlesetta Shanks   Total critical care time: 45 minutes  Critical care time was exclusive of separately billable procedures and treating other patients.  Critical care was necessary to treat or prevent imminent or life-threatening deterioration.  Critical care was time spent personally by me on the following activities: development of treatment plan with patient and/or surrogate as well as nursing, discussions with consultants, evaluation of patient's response to treatment, examination of patient, obtaining history from patient or surrogate, ordering and performing treatments and interventions, ordering and review of laboratory studies, ordering and review of radiographic studies, pulse oximetry and re-evaluation of patient's condition. Medications Ordered in ED Medications  albuterol (PROVENTIL,VENTOLIN) solution continuous neb (10 mg/hr Nebulization New Bag/Given 09/23/17 0813)  furosemide (LASIX) injection 80 mg (80 mg Intravenous Given 09/23/17 1042)  atorvastatin (LIPITOR) tablet 40 mg (has no administration in time range)  aspirin chewable tablet 81 mg (81 mg Oral Not Given 09/23/17 1246)  heparin ADULT infusion 100 units/mL (25000 units/231m sodium chloride 0.45%) (900 Units/hr Intravenous New Bag/Given 09/23/17 1133)  potassium chloride 10 mEq in 100 mL IVPB (10 mEq Intravenous New Bag/Given 09/23/17 1246)  hydrALAZINE (APRESOLINE) injection 5 mg (has no administration in time range)  ipratropium (ATROVENT) nebulizer solution 1 mg ( Nebulization Given 09/23/17 0815)  aspirin suppository 300 mg (300 mg Rectal Given 09/23/17 1131)  heparin bolus via infusion 4,000 Units (4,000 Units Intravenous Bolus from Bag 09/23/17 1133)     Initial Impression / Assessment and Plan / ED Course  I have reviewed the triage vital signs and the nursing  notes.  Pertinent labs & imaging results that were available during my care of the patient were reviewed by me and considered in my medical decision making (see chart for details).  Clinical Course as of Sep 23 1329  Wed Sep 23, 2017  [id number] CT scan to rule out intracranial injury or bleed before initiating heparin as discussed with cardiology.  Currently consultation is being done by heart failure clinic.   [MP]  [id number] condition   [MP]  [id number] condition unchanged.  He remains breathing calmly on BiPAP.  Heart rate 90s to low 100.  Blood pressure stable.  Oxygen saturation stable on BiPAP.  Patient  is very drowsy but awakens briefly to stimulus    [MP]  8 Consulted family medicine and reviewed HPI and diagnostic results.  They will be down to see the patient in the emergency department for admission.   [MP]    Clinical Course User Index [MP] Charlesetta Shanks, MD      Final Clinical Impressions(s) / ED Diagnoses   Final diagnoses:  Acute on chronic diastolic congestive heart failure ([place])  Acute respiratory failure, unspecified whether with hypoxia or hypercapnia ([place])  Severe comorbid illness   Present as outlined above with mental status change and respiratory failure.  Is remained stable on BiPAP with good oxygenation and stable blood pressure.  CT head did not show any acute findings.  Etiology of somnolence remains unclear.  Patient did have significantly elevated troponin and may be presenting with atypical MI acute congestive heart failure.  ABG however does not show patient to be hypercapnic or severely hypoxic.  Patient does not have evident signs of pneumonia.  He does not have leukocytosis, fever or focal infiltrates.  Immediate history is limited but there is no history provided of developing cough or signs of pneumonia.  Consultation with cardiology recommendation is for heparin until rule out and further evaluation.  This plan has been reviewed with  family practice admitting team. ED Discharge Orders    None       Charlesetta Shanks, MD 09/23/17 1335

## 2017-09-23 NOTE — Progress Notes (Signed)
Patient placed on BiPAP 12/5 tolerating well at this time. ABG will be obtained .

## 2017-09-23 NOTE — ED Notes (Signed)
ED Provider at bedside. 

## 2017-09-23 NOTE — ED Notes (Signed)
Pt transported to CT with this RN and Lonn Georgia, RRT

## 2017-09-23 NOTE — ED Notes (Signed)
Critical I-stat troponin resulted to pfieffer, md and chelsea c,rn

## 2017-09-23 NOTE — ED Notes (Signed)
Critical I-stat lactic results reported to Colvin Caroli, MD and Honor Loh, RN

## 2017-09-23 NOTE — H&P (Addendum)
Leonard [name] Service Pager: [phone]  [name] Medical record number: [id number] Date of birth: July 23, 1950 Age: 67 y.o. Gender: male  Primary Care Provider: Kathrene Alu, MD Consultants: heart failure Code Status: full  Chief Complaint: AMS  Assessment and Plan: Leonard [name] is a 67 y.o. male presenting with AMS. PMH is significant for HFpEF, COPD on home oxygen, CAD s/p CABG, PAD, CKD IV, HTN, T2DM, HLD, hypothyroidism.   Acute Hypoxic Respiratory Failure. Most likely 2/2 CHF exacerbation given BNP 1815 with interstitial edema on CXR. Also possible that CHF exacerbation could have been caused by NSTEMI given trop 2.11. Last echo June 2019 with EF 65-70% and PA peak pressure 63 mmHg. Unlikely PNA as afebrile and no leukocytosis though does have lactic acid 3.93. Will monitor off antibiotics. DDx also includes COPD however ABG does not show CO2 retention. Unlikely hypertensive emergency as BP not acutely elevated. - admit to stepdown, attending Dr. Owens Shark - monitor on telemetry - trend lactate - follow BCx and Ux - continue BiPAP, anticipate need for aggressive pulm toliet - HF cardiology following, appreciate recommendations - IV lasix 80 BID  - IV KCl 40 mEq x1 - Keep O2 sat 88-92% given COPD GOLD III and high O2 sat can depress respiratory drive   - continue home  glucopyrrolate BID - duonebs q4prn - daily wts, strict I/Os  AMS. Likely due to respiratory issue as CT head negative - ammonia, UA, UDS pending  Elevated troponin in setting of CAD s/p CABG. Likely NSTEMI vs demand from respiratory distress. Trop poc 2.11 without ST changes on EKG. Patient without chest pain - ASA 359m suppository x1 - continue home ASA81 and atorvastatin - starting heparin gtt since head CT neg - trend troponins - am EKG  HTN. At home on coreg, hydralazine, imdur, doxazosin, clonidine. BP 168/92 - continue  home medications - hydralazine [id number]  for SBP>200 or DBP>120 - monitor vitals  T2DM. Last a1c 6.9 on 07/05/17 - monitor CBGs - sSSI  CKD with anemia, stable. Baseline Cr 1.7-2.3. Baseline Hgb 7.5, on aranesp monthly - monitor BMP, CBC - continue home ferrous sulfate  BPH - continue home doxazosin  Hypothyroidism last TSH 3.338 on 07/10/17 - continue home synthroid   FEN/GI: NPO, protonix Prophylaxis: heparin gtt   Disposition: admit to step down   History of Present Illness:  [name] a [age].o. male presenting with altered mental status. History obtained per chart review and from family member at bedside as patient is unable to give history due to respiratory distress and mental status change.  He was found to be unresponsive at nursing facility with yellow froth around his mouth and O2 sats in 70 requiring NRB. Niece at bedside said that he has slowly been having increased LE edema since last hospital admission that they thought was due to diet noncompliance at SNF as patient had been drinking many sodas and eating high salt food. She had visited him on Sunday and he had been doing well without any respiratory symptoms. No wheeze or cough or shortness of breath at that time.  She states that the SNF had seen patient last night and he appeared to be in his usual state of self and then was found at 7am this morning in respiratory distress. Patient denies pain anywhere, inparticular chest pain.   In the ED he was placed on bipap with some improvement but found to  have elevated BNP and troponin.  HF Cardiology was consulted and started diuresis with IV lasix 80 BID. Given the elevated troponin, the ED checked a head CT which was negative and plans to start him on a heparin gtt.   Review Of Systems: Per HPI with the following additions:    Review of Systems  Unable to perform ROS: Mental status change  Cardiovascular: Negative for chest pain.    Patient Active Problem List    Diagnosis Date Noted  . Moderate malnutrition ([place]) 08/27/2017  . Hypoxemia   . Atelectasis   . HCAP (healthcare-associated pneumonia)   . Chronic respiratory failure with hypoxia ([place]) 08/25/2017  . Pulmonary artery hypertension ([place]) 07/25/2017  . Pressure injury of skin 07/20/2017  . Diabetic polyneuropathy associated with type 2 diabetes mellitus ([place]) 01/14/2017  . Pleural effusion   . Chronic anemia 02/28/2016  . Diabetic retinopathy ([place]) 11/13/2015  . Iron deficiency anemia 08/03/2015  . Type II diabetes mellitus with complication ([place])   . 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 Deficiency anemia 01/15/2015  . CKD (chronic kidney disease) stage 4, GFR 15-29 ml/min (HCC) 01/15/2015  . Hypothyroidism 11/22/2014  . Acute on chronic diastolic heart failure ([place]) 07/04/2014  . COPD GOLD III  02/16/2014  . Acute respiratory failure with hypoxia ([place])   . Anemia of renal disease   . History of tobacco use 08/23/2013  . 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 CAD (coronary artery disease) 04/06/2013  . PVD - hx of Rt SFA PTA and s/p Rt 4-5th toe amp Dec 2014 04/05/2013  . 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 Mixed hyperlipidemia 07/17/2008    Past Medical History: Past Medical History:  Diagnosis Date  . Acute on chronic diastolic heart failure ([place]) 07/04/2014  . Anemia of renal disease   . Atherosclerotic peripheral vascular disease with gangrene ([place]) 02/14/2017  . CAD (coronary artery disease) 04/06/2013  . Cancer ([place])    skin cancer-  . CHF (congestive heart failure) ([place])   . Chronic anemia 02/28/2016  . Chronic deep vein thrombosis (DVT) of femoral vein of left lower extremity ([place]) 09/03/2016  . Chronic respiratory failure with hypoxia ([place]) 08/25/2017  . CKD (chronic kidney disease) stage 4, GFR 15-29 ml/min (HCC) 01/15/2015   Follows with [facility].  Last visits:  07/23/16: No change in meds. Worried that if we increase diuretics could lead to  another AKI  . CKD (chronic kidney disease), stage IV ([place])    07/07/2017  . Claudication ([place])   . Collapse of left talus 01/14/2017  . COPD GOLD III  02/16/2014   Quit smoking 2014  - PFT's  04/06/13   FEV1 1.28 (44 % ) ratio 48  p no % improvement from saba p ?  prior to study with DLCO  43/54 % corrects to 97  % for alv volume - 01/31/2016  After extensive coaching HFA effectiveness =    75% from baseline 0 > changed to sprivia respimat   - PFT's  03/17/2016  FEV1 2.25  (81 % ) ratio 88  p 2 % improvement from saba p spiriva respimat x 2  prior to study with  . Coronary artery disease   . Diabetes ([place])   . Diabetic polyneuropathy associated with type 2 diabetes mellitus ([place]) 01/14/2017  . Diabetic retinopathy ([place]) 11/13/2015   Per Dr. Erenest Rasher eye exam in 10/17/15, mild background diabetic retinopathy  . Fatigue associated with anemia 01/21/2016  . Gangrene of  toe ([place])    right 5th/notes 01/04/2013   . HCAP (healthcare-associated pneumonia)   . Hematuria 06/27/2015  . High cholesterol   . History of tobacco use 08/23/2013   Smoked pack per day for 46 years Quit smoking - 02/03/2013   . Hypertension   . Hypothyroidism 11/22/2014  . Idiopathic chronic gout of left foot without tophus   . Iron deficiency anemia 08/03/2015  . Left foot pain 10/29/2016  . Long term (current) use of anticoagulants [Z79.01] 09/09/2016  . 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 Mixed hyperlipidemia 07/17/2008  . Morbid obesity due to excess calories ([place]) 03/17/2016  . PAD (peripheral artery disease) ([place])   . Pleural effusion   . Pneumonitis 07/26/2017  . Pulmonary artery hypertension ([place]) 07/25/2017  . PVD - hx of Rt SFA PTA and s/p Rt 4-5th toe amp Dec 2014 04/05/2013  . 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 Respiratory failure ([place]) 08/13/2017  . 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 Secondary hypertension   . Shortness of breath dyspnea    with exertion  . Skin lesion of face 02/22/2015  . SOB  (shortness of breath)   . Type 2 diabetes mellitus with pressure callus ([place]) 12/29/2016  . Type II diabetes mellitus (HCC)    Type II  . Type II diabetes mellitus with complication [facility])     Past Surgical History: Past Surgical History:  Procedure Laterality Date  . ABDOMINAL AORTOGRAM W/LOWER EXTREMITY N/A 03/12/2017   Procedure: ABDOMINAL AORTOGRAM W/LOWER EXTREMITY;  Surgeon: Conrad LeRoy, MD;  Location: [place];  Service: Cardiovascular;  Laterality: N/A;  . AMPUTATION Right 01/28/2013   Procedure: RAY AMPUTATION RIGHT  4th & 5th TOE;  Surgeon: Rosetta Posner, MD;  Location: [place];  Service: Vascular;  Laterality: Right;  . AMPUTATION Left 03/15/2017   Procedure: AMPUTATION ABOVE KNEE;  Surgeon: Waynetta Sandy, MD;  Location: [facility];  Service: Vascular;  Laterality: Left;  . ANGIOPLASTY / STENTING FEMORAL Right 01/13/2013   superficial femoral artery x 2 (3 mm x 60 mm, 4 mm x 60 mm)  . AV FISTULA PLACEMENT Left 02/11/2016   Procedure: LEFT UPPER ARM BRACHIAL CEPHALIC ARTERIOVENOUS (AV) FISTULA CREATION;  Surgeon: Conrad Hutchinson, MD;  Location: [facility];  Service: Vascular;  Laterality: Left;  . COLONOSCOPY W/ POLYPECTOMY    . CORONARY ARTERY BYPASS GRAFT N/A 04/07/2013   Procedure: CORONARY ARTERY BYPASS GRAFTING (CABG);  Surgeon: Ivin Poot, MD;  Location: [facility];  Service: Open Heart Surgery;  Laterality: N/A;  . INTRAOPERATIVE TRANSESOPHAGEAL ECHOCARDIOGRAM N/A 04/07/2013   Procedure: INTRAOPERATIVE TRANSESOPHAGEAL ECHOCARDIOGRAM;  Surgeon: Ivin Poot, MD;  Location: [place];  Service: Open Heart Surgery;  Laterality: N/A;  . LEFT HEART CATHETERIZATION WITH CORONARY ANGIOGRAM N/A 04/05/2013   Procedure: LEFT HEART CATHETERIZATION WITH CORONARY ANGIOGRAM;  Surgeon: Peter M Martinique, MD;  Location[facility] C[facility] LAB;  Service: Cardiovascular;  Laterality: N/A;  . LOWER EXTREMITY ANGIOGRAM Bilateral 01/06/2013   Procedure: LOWER EXTREMITY ANGIOGRAM;  Surgeon: Conrad [place], MD;   Location: [facility] CATH LAB;  Service: Cardiovascular;  Laterality: Bilateral;  . LOWER EXTREMITY ANGIOGRAM Right 01/13/2013   Procedure: LOWER EXTREMITY ANGIOGRAM;  Surgeon: Conrad Concord, MD;  Location: [facility] CATH LAB;  Service: Cardiovascular;  Laterality: Right;  . SKIN CANCER EXCISION  2017  . THROAT SURGERY  1983   polyps  . TONSILLECTOMY AND ADENOIDECTOMY  ~ 1965    Social History: Social History  Tobacco Use  . Smoking status: Former Smoker    Packs/day: 1.00    Years: 46.00    Pack years: 46.00    Types: Cigarettes    Start date: 02/04/1967    Last attempt to quit: 02/03/2013    Years since quitting: 4.6  . Smokeless tobacco: Never Used  Substance Use Topics  . Alcohol use: No    Alcohol/week: 0.0 standard drinks  . Drug use: No   Additional social history:  Lives at [facility] Please also refer to relevant sections of EMR.  Family History: Family History  Problem Relation Age of Onset  . Cancer Mother        lung cancer  . Hypertension Mother   . Cancer Sister        patient thinks it was uterine cancer  . Hypertension Sister   . Heart attack Sister   . Stroke Neg Hx     Allergies and Medications: Allergies  Allergen Reactions  . Penicillins Other (See Comments)    Has patient had a PCN reaction causing immediate rash, facial/tongue/throat swelling, SOB or lightheadedness with hypotension: Unknown Has patient had a PCN reaction causing severe rash involving mucus membranes or skin necrosis: Unknown Has patient had a PCN reaction that required hospitalization: Unknown Has patient had a PCN reaction occurring within the last 10 years: Unknown If all of the above answers are "NO", then may proceed with Cephalosporin use.    Current Facility-Administered Medications on File Prior to Encounter  Medication Dose Route Frequency Provider Last Rate Last Dose  . sodium chloride flush (NS) 0.9 % injection 10 mL  10 mL Intracatheter PRN Heath Lark, MD       Current  Outpatient Medications on File Prior to Encounter  Medication Sig Dispense Refill  . acetaminophen (TYLENOL) 325 MG tablet Take 650 mg by mouth every 8 (eight) hours as needed for mild pain or fever.     Marland Kitchen aspirin EC 81 MG tablet Take 1 tablet (81 mg total) by mouth daily. 150 tablet 0  . atorvastatin (LIPITOR) 40 MG tablet TAKE ONE (1) TABLET BY MOUTH EVERY DAY AT 6:00PM (Patient taking differently: Take 40 mg by mouth daily at 6 PM. ) 90 tablet 3  . Blood Glucose Monitoring Suppl (ONETOUCH VERIO FLEX SYSTEM) w/Device KIT 1 kit by Does not apply route daily. 1 kit 3  . carvedilol (COREG) 12.5 MG tablet Take 1 tablet (12.5 mg total) by mouth 2 (two) times daily with a meal. 60 tablet 0  . cloNIDine (CATAPRES - DOSED IN MG/24 HR) 0.2 mg/24hr patch Place 1 patch (0.2 mg total) onto the skin once a week. (Patient taking differently: Place 0.2 mg onto the skin every Monday. ) 4 patch 0  . [START ON 09/28/2017] Darbepoetin Alfa (ARANESP) 40 MCG/0.4ML SOSY injection Inject 0.4 mLs (40 mcg total) into the skin every 30 (thirty) days. 8.4 mL 2  . doxazosin (CARDURA) 8 MG tablet Take 1 tablet (8 mg total) by mouth every evening.    . feeding supplement, ENSURE ENLIVE, (ENSURE ENLIVE) LIQD Take 237 mLs by mouth 3 (three) times daily between meals. 237 mL 12  . ferrous sulfate (FEROSUL) 325 (65 FE) MG tablet Take 1 tablet (325 mg total) by mouth daily at 12 noon. 30 tablet 0  . glucose blood (ONETOUCH VERIO) test strip Use as instructed to test three times daily. ICD-10 code: E11.22 100 each 12  . hydrALAZINE (APRESOLINE) 100 MG tablet TAKE ONE TABLET BY MOUTH  EVERY EIGHT HOURS (Patient taking differently: Take 100 mg by mouth every 8 (eight) hours. ) 90 tablet 1  . insulin aspart (NOVOLOG FLEXPEN) 100 UNIT/ML FlexPen Inject 15 Units 2 (two) times daily before lunch and supper into the skin. (Patient taking differently: Inject 0-15 Units into the skin See admin instructions. Inject as per sliding scale: 0-150= 0  units 151-400=15 units Call PCP if CBG<70 or >401) 15 mL 5  . insulin glargine (LANTUS) 100 UNIT/ML injection Inject 24 Units into the skin at bedtime.    Marland Kitchen ipratropium-albuterol (DUONEB) 0.5-2.5 (3) MG/3ML SOLN Take 3 mLs by nebulization 4 (four) times daily as needed (shortness of breath and wheezing).     . isosorbide mononitrate (IMDUR) 60 MG 24 hr tablet TAKE ONE (1) TABLET BY MOUTH EVERY DAY (Patient taking differently: Take 60 mg by mouth daily. ) 30 tablet 3  . levothyroxine (SYNTHROID, LEVOTHROID) 75 MCG tablet Take 75 mcg by mouth daily before breakfast.     . nitroGLYCERIN (NITROSTAT) 0.4 MG SL tablet Place 1 tablet (0.4 mg total) under the tongue every 5 (five) minutes as needed for chest pain. 30 tablet 12  . ONETOUCH DELICA LANCETS 31V MISC 1 each by Does not apply route 3 (three) times daily. ICD-10 code: E11.22 100 each 12  . pantoprazole (PROTONIX) 40 MG tablet Take 40 mg by mouth daily.    . polyethylene glycol (MIRALAX / GLYCOLAX) packet Take 17 g by mouth daily as needed for mild constipation or moderate constipation. (Patient taking differently: Take 17 g by mouth every 8 (eight) hours as needed for mild constipation. ) 14 each 0  . torsemide (DEMADEX) 20 MG tablet Take 60 mg by mouth daily.      Objective: BP (!) 159/74   Pulse (!) 111   Temp 97.8 F (36.6 C) (Temporal)   Resp (!) 27   SpO2 100%  Exam: General: lying in bed with Bipap in place Eyes: EOMI, PERRL ENTM: Bipap in place Neck: supple, no lymphadenopathy. + JVD Cardiovascular: tachycardic, regular, normal S1 and S2. No murmurs. 2+ R DP pulse. Respiratory: diffuse crackles with some accessory muscle use Gastrointestinal: soft, nontender, nondistended, + bowel sounds, no organomegaly MSK: 2+ pitting edema to mid thigh on R leg. 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 Thickened toenails on R foot, no skin breakdown or ulcers Neuro: somnolent but awakes to voice and touch. Able to follow simple commands and  intermittently answers yes/no questions appropriately  Labs and Imaging: CBC BMET  Recent Labs  Lab 09/23/17 0745 09/23/17 0810  WBC 6.9  --   HGB 7.3* 7.8*  HCT 25.7* 23.0*  PLT PLATELET CLUMPS NOTED ON SMEAR, UNABLE TO ESTIMATE  --    Recent Labs  Lab 09/23/17 0745 09/23/17 0810  NA 146* 145  K 3.7 3.6  CL 111 110  CO2 18*  --   BUN 33* 33*  CREATININE 2.31* 2.00*  GLUCOSE 227* 220*  CALCIUM 8.2*  --      BNP    Component Value Date/Time   BNP 1,815.8 (H) 09/23/2017 0745   Lactic Acid, Venous    Component Value Date/Time   LATICACIDVEN 3.93 (HH) 09/23/2017 0811    Troponin (Point of Care Test) Recent Labs    09/23/17 0808  TROPIPOC 2.11*     ABG    Component Value Date/Time   PHART 7.352 09/23/2017 0842   PCO2ART 34.3 09/23/2017 0842   PO2ART 163.0 (H) 09/23/2017 4008  HCO3 19.1 (L) 09/23/2017 0842   TCO2 20 (L) 09/23/2017 0842   ACIDBASEDEF 6.0 (H) 09/23/2017 0842   O2SAT 99.0 09/23/2017 0842     Ct Head Wo Contrast  Result Date: 09/23/2017 CLINICAL DATA:  Altered level of consciousness. EXAM: CT HEAD WITHOUT CONTRAST TECHNIQUE: Contiguous axial images were obtained from the base of the skull through the vertex without intravenous contrast. COMPARISON:  08/05/2017 FINDINGS: Brain: No evidence of acute infarction, hemorrhage, extra-axial collection, ventriculomegaly, or mass effect. Generalized cerebral atrophy. Periventricular white matter low attenuation likely secondary to microangiopathy. Vascular: Cerebrovascular atherosclerotic calcifications are noted. Skull: Negative for fracture or focal lesion. Sinuses/Orbits: Visualized portions of the orbits are unremarkable. Mild left mastoid effusion. Bilateral maxillary sinus mucosal thickening. Other: Periapical cyst of maxillary central incisor. IMPRESSION: 1. No acute intracranial pathology. 2. Chronic microvascular disease and cerebral atrophy. Electronically Signed   By: Kathreen Devoid   On: 09/23/2017  10:30   Dg Chest Port 1 View  Result Date: 09/23/2017 CLINICAL DATA:  Patient found unresponsive at nursing home. EXAM: PORTABLE CHEST 1 VIEW COMPARISON:  08/30/2017 FINDINGS: Sternotomy wires are present. Lungs are adequately inflated with hazy bilateral perihilar opacification likely moderate interstitial edema. No definite effusion. Stable cardiomegaly. Remainder of the exam is unchanged. IMPRESSION: Hazy bilateral perihilar opacification likely interstitial edema with stable cardiomegaly. Electronically Signed   By: Marin Olp M.D.   On: 09/23/2017 08:18    Bufford Lope, DO 09/23/2017, 10:27 AM PGY-3, [place] Intern pager: [phone], text pages welcome

## 2017-09-23 NOTE — ED Notes (Signed)
Report attempted 

## 2017-09-23 NOTE — Progress Notes (Signed)
Patient transported to CT and back to [facility] without complications. RN at bedside.

## 2017-09-23 NOTE — Progress Notes (Signed)
CRITICAL VALUE ALERT  Critical Value:  Troponin 3.77  Date & Time Notied:  09/23/17 1825  Provider Notified: Family Medicine  Orders Received/Actions taken: Dr Ouida Sills called back & recommended to continue monitoring pt at this time

## 2017-09-23 NOTE — Consult Note (Addendum)
Advanced Heart Failure Team Consult Note   Primary Physician: Kathrene Alu, MD PCP-Cardiologist:  No primary care provider on file.  Reason for Consultation: A/C diastolic HF  HPI:    Leonard [name] is seen today for evaluation of A/C diastolic HF at the request of Dr Johnney Killian.   Leonard [name] is a 67 y.o. male with history of CAD s/p CABG, PAD, chronic diastolic CHF, CKD stage IV, COPD on home oxygen, and HTN.   Recent admission 6/06-0/04/59 with A/C diastolic HF. Diuresed 20 lbs with high dose lasix and metolazone. Discharged on higher dose of torsemide. Chest CT showed large right pleural effusion, but he did not get a thoracentesis. DC weight 164 lbs. DCd to SNF.  Last seen in HF clinic 09/15/17. He was doing well. He had mild RLE edema, but otherwise volume was stable. Weight was trending down at 161 lbs.   History obtained from chart and from family member at bedside. She says over the last week, RLE has been getting worse. She last saw him on Sunday and he was alert and interactive with no respiratory problems. He was drinking a lot of fluids as there is a drinking machine in the dining area that he has access to.   He was found unresponsive at SNF this morning with yellow froth at the mouth and O2 sats 70s. Placed on NRB with improvement in sats and was responsive to verbal stimuli. CBG 270. Presented to [facility] and placed in BiPAP.  Pertinent admission labs include: Na 146, K 3.7, creatinine 2.31, BNP 1800 (previously 340-530), hemoglobin 7.3, WBC 6.9, blood cultures pending, troponin 2.11, lactic acid 3.93, ammonia and UA pending. ABG: pH 7.35, pCO2 34, pO2 163, bicarb 19 CXR: Hazy bilateral perihilar opacification likely interstitial edema with stable cardiomegaly.  He is minimally responsive this morning, only opens eyes to voice then drifts back to sleep. He remains on BiPAP with O2 sats 100%. He is afebrile. SBP 150s. Has not received any lasix yet.   I do not see  any changes on EKG. Head CT pending. Heparin for ACS to be started if no bleed. Reportedly no CP, but too drowsy to speak with me right now.   Echo 07/2017: EF 65-70%, mild LVH, grade 2 DD, Aortic valve severely thickened and calcified with no mobility restriction, mild TR, PA peak pressure 46 mm Hg  Review of Systems: [y] = yes, _0  = no   General: Weight gain Blue.Reese ]; Weight loss _1 ; Anorexia _2 ; Fatigue _3 ; Fever _4 ; Chills _5 ; Weakness _6   Cardiac: Chest pain/pressure _7 ; Resting SOB _8 ; Exertional SOB _9 ; Orthopnea _10 ; Pedal Edema [ y]; Palpitations _11 ; Syncope _12 ; Presyncope _13 ; Paroxysmal nocturnal dyspnea_14   Pulmonary: Cough Blue.Reese ]; Wheezing_15 ; Hemoptysis_16 ; Sputum _17 ; Snoring _18   GI: Vomiting_19 ; Dysphagia_20 ; Melena_21 ; Hematochezia _22 ; Heartburn_23 ; Abdominal pain _24 ; Constipation _25 ; Diarrhea _26 ; BRBPR _27   GU: Hematuria_28 ; Dysuria _29 ; Nocturia_30   Vascular: Pain in legs with walking _31 ; Pain in feet with lying flat _32 ; Non-healing sores _33 ; Stroke _34 ; TIA _35 ; Slurred speech _36 ;  Neuro: Headaches_37 ; Vertigo_38 ; Seizures_39 ; Paresthesias_40 ;Blurred vision _41 ; Diplopia _42 ; Vision changes _43   Ortho/Skin: Arthritis _44 ; Joint pain _45 ; Muscle pain _46 ; Joint swelling _47 ; Back  Pain _0 ; Rash _1   Psych: Depression_2 ; Anxiety_3   Heme: Bleeding problems _4 ; Clotting disorders _5 ; Anemia _6   Endocrine: Diabetes [ y]; Thyroid dysfunction_7   Home Medications Prior to Admission medications   Medication Sig Start Date End Date Taking? Authorizing Provider  acetaminophen (TYLENOL) 325 MG tablet Take 650 mg by mouth every 8 (eight) hours as needed for mild pain or fever.     [provider]  aspirin EC 81 MG tablet Take 1 tablet (81 mg total) by mouth daily. 08/07/17 08/07/18  Steve Rattler, DO  atorvastatin (LIPITOR) 40 MG tablet TAKE ONE (1) TABLET BY MOUTH EVERY DAY AT 6:00PM Patient taking differently: Take 40 mg by mouth once a day at 6 PM  11/17/16   Larey Dresser, MD  Blood Glucose Monitoring Suppl ([place]) w/Device KIT 1 kit by Does not apply route daily. 10/10/16   McDiarmid, Blane Ohara, MD  carvedilol (COREG) 12.5 MG tablet Take 1 tablet (12.5 mg total) by mouth 2 (two) times daily with a meal. 08/30/17   Meccariello, Bernita Raisin, DO  cloNIDine (CATAPRES - DOSED IN MG/24 HR) 0.2 mg/24hr patch Place 1 patch (0.2 mg total) onto the skin once a week. Patient taking differently: Place 0.2 mg onto the skin every Monday.  08/09/17   Steve Rattler, DO  Darbepoetin Alfa (ARANESP) 40 MCG/0.4ML SOSY injection Inject 0.4 mLs (40 mcg total) into the skin every 30 (thirty) days. 09/28/17   Meccariello, Bernita Raisin, DO  doxazosin (CARDURA) 8 MG tablet Take 1 tablet (8 mg total) by mouth every evening. 12/01/16   Zenia Resides, MD  feeding supplement, ENSURE ENLIVE, (ENSURE ENLIVE) LIQD Take 237 mLs by mouth 3 (three) times daily between meals. 08/07/17   Steve Rattler, DO  ferrous sulfate (FEROSUL) 325 (65 FE) MG tablet Take 1 tablet (325 mg total) by mouth daily at 12 noon. 08/31/17   Meccariello, Bernita Raisin, DO  glucose blood (ONETOUCH VERIO) test strip Use as instructed to test three times daily. ICD-10 code: E11.22 10/15/16   McDiarmid, Blane Ohara, MD  hydrALAZINE (APRESOLINE) 100 MG tablet TAKE ONE TABLET BY MOUTH EVERY EIGHT HOURS 11/14/16   [name], MD  insulin aspart (NOVOLOG FLEXPEN) 100 UNIT/ML FlexPen Inject 15 Units 2 (two) times daily before lunch and supper into the skin. Patient taking differently: Inject 0-15 Units into the skin See admin instructions. Inject as per sliding scale: 0-150= 0 units 151-400=15 units Call PCP if CBG<70 or >401 12/15/16   Zenia Resides, MD  insulin glargine (LANTUS) 100 UNIT/ML injection Inject 24 Units into the skin at bedtime.    [provider]  ipratropium-albuterol (DUONEB) 0.5-2.5 (3) MG/3ML SOLN Take 3 mLs by nebulization 4 (four) times daily as needed  (shortness of breath and wheezing).     [provider]  isosorbide mononitrate (IMDUR) 60 MG 24 hr tablet TAKE ONE (1) TABLET BY MOUTH EVERY DAY Patient taking differently: TAKE ONE (1) TABLET BY MOUTH AT BEDTIME 01/14/17   Bensimhon, Shaune Pascal, MD  levothyroxine (SYNTHROID, LEVOTHROID) 75 MCG tablet Take 75 mcg by mouth daily before breakfast.     [provider]  nitroGLYCERIN (NITROSTAT) 0.4 MG SL tablet Place 1 tablet (0.4 mg total) under the tongue every 5 (five) minutes as needed for chest pain. Patient not taking: Reported on 09/15/2017 11/14/16   [name], MD  [facility] DELICA LANCETS 21H MISC 1 each by  Does not apply route 3 (three) times daily. ICD-10 code: E11.22 10/15/16   McDiarmid, Blane Ohara, MD  pantoprazole (PROTONIX) 40 MG tablet Take 40 mg by mouth daily.    [provider]  polyethylene glycol (MIRALAX / GLYCOLAX) packet Take 17 g by mouth daily as needed for mild constipation or moderate constipation. Patient taking differently: Take 17 g by mouth every 8 (eight) hours as needed for mild constipation.  08/07/17   Steve Rattler, DO  torsemide (DEMADEX) 20 MG tablet Take 60 mg by mouth daily.    [provider]    Past Medical History: Past Medical History:  Diagnosis Date  . Acute on chronic diastolic heart failure ([place]) 07/04/2014  . Anemia of renal disease   . Atherosclerotic peripheral vascular disease with gangrene ([place]) 02/14/2017  . CAD (coronary artery disease) 04/06/2013  . Cancer ([place])    skin cancer-  . CHF (congestive heart failure) ([place])   . Chronic anemia 02/28/2016  . Chronic deep vein thrombosis (DVT) of femoral vein of left lower extremity ([place]) 09/03/2016  . Chronic respiratory failure with hypoxia ([place]) 08/25/2017  . CKD (chronic kidney disease) stage 4, GFR 15-29 ml/min (HCC) 01/15/2015   Follows with [facility].  Last visits:  07/23/16: No change in meds. Worried that if we increase diuretics could lead to  another AKI  . CKD (chronic kidney disease), stage IV ([place])    07/07/2017  . Claudication ([place])   . Collapse of left talus 01/14/2017  . COPD GOLD III  02/16/2014   Quit smoking 2014  - PFT's  04/06/13   FEV1 1.28 (44 % ) ratio 48  p no % improvement from saba p ?  prior to study with DLCO  43/54 % corrects to 97  % for alv volume - 01/31/2016  After extensive coaching HFA effectiveness =    75% from baseline 0 > changed to sprivia respimat   - PFT's  03/17/2016  FEV1 2.25  (81 % ) ratio 88  p 2 % improvement from saba p spiriva respimat x 2  prior to study with  . Coronary artery disease   . Diabetes ([place])   . Diabetic polyneuropathy associated with type 2 diabetes mellitus ([place]) 01/14/2017  . Diabetic retinopathy ([place]) 11/13/2015   Per Dr. Erenest Rasher eye exam in 10/17/15, mild background diabetic retinopathy  . Fatigue associated with anemia 01/21/2016  . Gangrene of toe ([place])    right 5th/notes 01/04/2013   . HCAP (healthcare-associated pneumonia)   . Hematuria 06/27/2015  . High cholesterol   . History of tobacco use 08/23/2013   Smoked pack per day for 46 years Quit smoking - 02/03/2013   . Hypertension   . Hypothyroidism 11/22/2014  . Idiopathic chronic gout of left foot without tophus   . Iron deficiency anemia 08/03/2015  . Left foot pain 10/29/2016  . Long term (current) use of anticoagulants [Z79.01] 09/09/2016  . 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 Mixed hyperlipidemia 07/17/2008  . Morbid obesity due to excess calories ([place]) 03/17/2016  . PAD (peripheral artery disease) ([place])   . Pleural effusion   . Pneumonitis 07/26/2017  . Pulmonary artery hypertension ([place]) 07/25/2017  . PVD - hx of Rt SFA PTA and s/p Rt 4-5th toe amp Dec 2014 04/05/2013  . 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 Respiratory failure ([place]) 08/13/2017  . 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 Secondary hypertension   . Shortness of breath dyspnea  with exertion  . Skin lesion of face 02/22/2015  . SOB  (shortness of breath)   . Type 2 diabetes mellitus with pressure callus ([place]) 12/29/2016  . Type II diabetes mellitus (HCC)    Type II  . Type II diabetes mellitus with complication [facility])     Past Surgical History: Past Surgical History:  Procedure Laterality Date  . ABDOMINAL AORTOGRAM W/LOWER EXTREMITY N/A 03/12/2017   Procedure: ABDOMINAL AORTOGRAM W/LOWER EXTREMITY;  Surgeon: Conrad [place] Anne, MD;  Location: [facility] CV LAB;  Service: Cardiovascular;  Laterality: N/A;  . AMPUTATION Right 01/28/2013   Procedure: RAY AMPUTATION RIGHT  4th & 5th TOE;  Surgeon: Rosetta Posner, MD;  Location: [place];  Service: Vascular;  Laterality: Right;  . AMPUTATION Left 03/15/2017   Procedure: AMPUTATION ABOVE KNEE;  Surgeon: Waynetta Sandy, MD;  Location: [place];  Service: Vascular;  Laterality: Left;  . ANGIOPLASTY / STENTING FEMORAL Right 01/13/2013   superficial femoral artery x 2 (3 mm x 60 mm, 4 mm x 60 mm)  . AV FISTULA PLACEMENT Left 02/11/2016   Procedure: LEFT UPPER ARM BRACHIAL CEPHALIC ARTERIOVENOUS (AV) FISTULA CREATION;  Surgeon: Conrad [place] Town, MD;  Location: [place];  Service: Vascular;  Laterality: Left;  . COLONOSCOPY W/ POLYPECTOMY    . CORONARY ARTERY BYPASS GRAFT N/A 04/07/2013   Procedure: CORONARY ARTERY BYPASS GRAFTING (CABG);  Surgeon: Ivin Poot, MD;  Location: [place];  Service: Open Heart Surgery;  Laterality: N/A;  . INTRAOPERATIVE TRANSESOPHAGEAL ECHOCARDIOGRAM N/A 04/07/2013   Procedure: INTRAOPERATIVE TRANSESOPHAGEAL ECHOCARDIOGRAM;  Surgeon: Ivin Poot, MD;  Location: [place];  Service: Open Heart Surgery;  Laterality: N/A;  . LEFT HEART CATHETERIZATION WITH CORONARY ANGIOGRAM N/A 04/05/2013   Procedure: LEFT HEART CATHETERIZATION WITH CORONARY ANGIOGRAM;  Surgeon: Peter M Martinique, MD;  Location: [facility] CATH LAB;  Service: Cardiovascular;  Laterality: N/A;  . LOWER EXTREMITY ANGIOGRAM Bilateral 01/06/2013   Procedure: LOWER EXTREMITY ANGIOGRAM;  Surgeon: Conrad Salem, MD;   Location[facility] CATH LAB;  Service: Cardiovascular;  Laterality: Bilateral;  . LOWER EXTREMITY ANGIOGRAM Right 01/13/2013   Procedure: LOWER EXTREMITY ANGIOGRAM;  Surgeon: Conrad Pacific, MD;  Location: [facility] CATH LAB;  Service: Cardiovascular;  Laterality: Right;  . SKIN CANCER EXCISION  2017  . THROAT SURGERY  1983   polyps  . TONSILLECTOMY AND ADENOIDECTOMY  ~ 1965    Family History: Family History  Problem Relation Age of Onset  . Cancer Mother        lung cancer  . Hypertension Mother   . Cancer Sister        patient thinks it was uterine cancer  . Hypertension Sister   . Heart attack Sister   . Stroke Neg Hx     Social History: Social History   Socioeconomic History  . Marital status: Single    Spouse name: Not on file  . Number of children: Not on file  . Years of education: Not on file  . Highest education level: Not on file  Occupational History  . Not on file  Social Needs  . Financial resource strain: Not on file  . Food insecurity:    Worry: Not on file    Inability: Not on file  . Transportation needs:    Medical: Not on file    Non-medical: Not on file  Tobacco Use  . Smoking status: Former Smoker    Packs/day: 1.00    Years: 46.00    Pack years:  46.00    Types: Cigarettes    Start date: 02/04/1967    Last attempt to quit: 02/03/2013    Years since quitting: 4.6  . Smokeless tobacco: Never Used  Substance and Sexual Activity  . Alcohol use: No    Alcohol/week: 0.0 standard drinks  . Drug use: No  . Sexual activity: Not Currently  Lifestyle  . Physical activity:    Days per week: Not on file    Minutes per session: Not on file  . Stress: Not on file  Relationships  . Social connections:    Talks on phone: Not on file    Gets together: Not on file    Attends religious service: Not on file    Active member of club or organization: Not on file    Attends meetings of clubs or organizations: Not on file    Relationship status: Not on file  Other Topics  Concern  . Not on file  Social History Narrative   Lives with sister, [name] in [place].  Retired - former Sports coach.   Patient in skilled nursing facility    Allergies:  Allergies  Allergen Reactions  . Penicillins Other (See Comments)    Has patient had a PCN reaction causing immediate rash, facial/tongue/throat swelling, SOB or lightheadedness with hypotension: Unknown Has patient had a PCN reaction causing severe rash involving mucus membranes or skin necrosis: Unknown Has patient had a PCN reaction that required hospitalization: Unknown Has patient had a PCN reaction occurring within the last 10 years: Unknown If all of the above answers are "NO", then may proceed with Cephalosporin use.     Objective:    Vital Signs:   Temp:  [97.8 F (36.6 C)] 97.8 F (36.6 C) (08/21 0739) Pulse Rate:  [111] 111 (08/21 0739) Resp:  [27] 27 (08/21 0739) BP: (159)/(74) 159/74 (08/21 0739) SpO2:  [100 %] 100 % (08/21 0813) FiO2 (%):  [50 %] 50 % (08/21 0813)    Weight change: There were no vitals filed for this visit.  Intake/Output:  No intake or output data in the 24 hours ending 09/23/17 0926    Physical Exam    General:  No resp difficulty. Lying in stretcher on BiPAP. Minimally responsive.  HEENT: normal Neck: supple. JVP 10-12. Carotids 2+ bilat; no bruits. No lymphadenopathy or thyromegaly appreciated. Cor: PMI nondisplaced. Regular rate & rhythm. No rubs, gallops or murmurs. Lungs: crackles in bases Abdomen: soft, nontender, nondistended. No hepatosplenomegaly. No bruits or masses. Good bowel sounds. Extremities: no cyanosis, clubbing, rash, RLE 2+ edema. Left AKA Neuro: minimally responsive. Tries to open eyes to voice then drifts back to sleep. Affect pleasant   Telemetry   Sinus tach 100s. Personally reviewed.   EKG    Sinus tach 111 bpm. Looks unchanged from last. Personally reviewed.   Labs   Basic Metabolic Panel: Recent Labs  Lab  09/23/17 0745 09/23/17 0810  NA 146* 145  K 3.7 3.6  CL 111 110  CO2 18*  --   GLUCOSE 227* 220*  BUN 33* 33*  CREATININE 2.31* 2.00*  CALCIUM 8.2*  --     Liver Function Tests: Recent Labs  Lab 09/23/17 0745  AST 57*  ALT 23  ALKPHOS 145*  BILITOT 1.4*  PROT 5.6*  ALBUMIN 2.6*   No results for input(s): LIPASE, AMYLASE in the last 168 hours. No results for input(s): AMMONIA in the last 168 hours.  CBC: Recent Labs  Lab 09/23/17 0745 09/23/17 0810  WBC 6.9  --   NEUTROABS 5.7  --   HGB 7.3* 7.8*  HCT 25.7* 23.0*  MCV 92.8  --   PLT PLATELET CLUMPS NOTED ON SMEAR, UNABLE TO ESTIMATE  --     Cardiac Enzymes: No results for input(s): CKTOTAL, CKMB, CKMBINDEX, TROPONINI in the last 168 hours.  BNP: BNP (last 3 results) Recent Labs    07/22/17 1044 08/13/17 1050 08/25/17 1001  BNP 372.8* 334.1* 526.9*    ProBNP (last 3 results) No results for input(s): PROBNP in the last 8760 hours.   CBG: No results for input(s): GLUCAP in the last 168 hours.  Coagulation Studies: Recent Labs    09/23/17 0745  LABPROT 16.9*  INR 1.38     Imaging   Dg Chest Port 1 View  Result Date: 09/23/2017 CLINICAL DATA:  Patient found unresponsive at nursing home. EXAM: PORTABLE CHEST 1 VIEW COMPARISON:  08/30/2017 FINDINGS: Sternotomy wires are present. Lungs are adequately inflated with hazy bilateral perihilar opacification likely moderate interstitial edema. No definite effusion. Stable cardiomegaly. Remainder of the exam is unchanged. IMPRESSION: Hazy bilateral perihilar opacification likely interstitial edema with stable cardiomegaly. Electronically Signed   By: Marin Olp M.D.   On: 09/23/2017 08:18      Medications:     Current Medications: . furosemide  60 mg Intravenous Once     Infusions: . albuterol 10 mg/hr (09/23/17 0813)       Patient Profile   Leonard [name] is a 67 y.o. male with history of CAD s/p CABG, PAD, chronic diastolic CHF,  CKD stage IV, COPD on home oxygen, and HTN.   Admitted from SNF with hypoxic respiratory distress.   Assessment/Plan   1. Acute respiratory failure with hypoxia - Now on BiPAP with O2 sats 100% - Lactic acid 3.93. Blood cultures pending. Afebrile. WBC 6.9. - CXR with bilateral interstitial edema.  2. AMS - Head CT pending - Minimally responsive. O2 sats now stable 100% on BiPAP. - Ammonia and UA pending  3. Positive troponin with hx of CAD and CABGx3 2015.  - Last LHC prior to CABG. Unable to assess for s/s ischemia due to AMS. Per primary, had no CP - Troponin 2.11. Trend.    - EKG looks unchanged from previous.   - Primary is starting heparin for ACS pending negative head CT.  - On ASA, statin, and coreg at home. AST elevated to 57, ALT normal. Head CT pending to r/o bleed.   - Continue ASA (scheduled for after head CT) and statin for now. Hold off on BB.    4. Acute on chronic diastolic CHF: Echo 10/3808: EF 65-70%, grade 2 DD, mild TR, PA peak pressure 46 mmHg - Volume status elevated. BNP 1800 with fluid on CXR. - Start 80 mg IV lasix BID.   - Not on spiro with CKD.  5. HTN:  - On hydralazine, imdur, cardura, and clonidine at home.  - SBP 150s this morning. Has not received any medications yet.   6. COPD: Gold stage III   - Severe obstruction on prior PFTs. Uses 2L home oxygen.  - Follows with Dr. Melvyn Novas outpatient.    7. CKD: Stage V.-  has L AVF. Follows with Dr Marval Regal. Baseline 1.6-2.2 - [place] 2.0 this am. Family requests that nephrology be notified of his admission. Defer to primary team.   8. Hx of DVT - Diagnosed 08/30/16 - No longer on AC.    9. Hx of pleural effusions R>L -  Did not receive thoracentesis while inpatient.  10. Extensive PAD - s/p L AKA for extensive LLE wounds with no re-vascularization options in 03/2017 by Dr. Donzetta Matters.   11. Anemia - Hemoglobin 7.8 this am. Received IV iron last admission. No obvious source of bleeding.   12  Pulmonary HTN - PA peak pressure 63 mmHg on Echo 07/20/17 - Suspect primarily WHO Group III and WHO Group II pulmonary HTN. Suspect he has mild/modPulm Hypertensionat baselineand pressures wereelevated in the setting of volume overload. - May need RHC to further evaluate  13. Deconditioning - Living at SNF. Limited by Left AKA.   Medication concerns reviewed with patient and pharmacy team. Barriers identified: none at this time  Length of Stay: [place], NP  09/23/2017, 9:26 AM  Advanced Heart Failure Team Pager [phone] (M-F; 7a - 4p)  Please contact [facility] for night-coverage after hours (4p -7a ) and weekends on [email or web address]  Patient seen and examined with the above-signed Advanced Practice Provider and/or Housestaff. I personally reviewed laboratory data, imaging studies and relevant notes. I independently examined the patient and formulated the important aspects of the plan. I have edited the note to reflect any of my changes or salient points. I have personally discussed the plan with the patient and/or family.  67 y/o male with HTN, COPD, diastolic HF, CKD and CAD s/p CABG 2015 admitted with acute respiratory failure, HTN and AMS.  CXR, BNP and exam /w with volume overload. POC trop 2.11. Denies CP. SBP 160-180. ECG ok. Lactate elevated initially now improved. Responding well to BIPAP and IV diuresis.   On exam On bipap  Comfortable JVP to jaw Cor Reg tachy Lungs + crackles Ab obese Ext warm mild edema LUE AVF  Acute on chronic diastolic HF with hypoxic respiratory failure likely due to HTN crisis. Agree with BIPAP and IV diuresis. Suspect trop elevation due to HF and HTN/demand ischemia. Will cycle.Try to avoid cath if possible with CKD. Repeat echo. Continue heparin for now  Glori Bickers, MD  1:45 PM

## 2017-09-23 NOTE — Progress Notes (Signed)
ANTICOAGULATION CONSULT NOTE - Initial Consult  Pharmacy Consult for heparin Indication: chest pain/ACS  Allergies  Allergen Reactions  . Penicillins Other (See Comments)    Has patient had a PCN reaction causing immediate rash, facial/tongue/throat swelling, SOB or lightheadedness with hypotension: Unknown Has patient had a PCN reaction causing severe rash involving mucus membranes or skin necrosis: Unknown Has patient had a PCN reaction that required hospitalization: Unknown Has patient had a PCN reaction occurring within the last 10 years: Unknown If all of the above answers are "NO", then may proceed with Cephalosporin use.     Patient Measurements: Height: 5\' 8"  (172.7 cm) Weight: 160 lb 15 oz (73 kg) IBW/kg (Calculated) : 68.4 Heparin Dosing Weight: 73kg  Vital Signs: Temp: 97.8 F (36.6 C) (08/21 0739) Temp Source: Temporal (08/21 0739) BP: 156/75 (08/21 1030) Pulse Rate: 101 (08/21 1030)  Labs: Recent Labs    09/23/17 0745 09/23/17 0810  HGB 7.3* 7.8*  HCT 25.7* 23.0*  PLT PLATELET CLUMPS NOTED ON SMEAR, UNABLE TO ESTIMATE  --   LABPROT 16.9*  --   INR 1.38  --   CREATININE 2.31* 2.00*    Estimated Creatinine Clearance: 35.2 mL/min (A) (by C-G formula based on SCr of 2 mg/dL (H)).   Medical History: Past Medical History:  Diagnosis Date  . Acute on chronic diastolic heart failure ([place]) 07/04/2014  . Anemia of renal disease   . Atherosclerotic peripheral vascular disease with gangrene ([place]) 02/14/2017  . CAD (coronary artery disease) 04/06/2013  . Cancer ([place])    skin cancer-  . CHF (congestive heart failure) ([place])   . Chronic anemia 02/28/2016  . Chronic deep vein thrombosis (DVT) of femoral vein of left lower extremity ([place]) 09/03/2016  . Chronic respiratory failure with hypoxia ([place]) 08/25/2017  . CKD (chronic kidney disease) stage 4, GFR 15-29 ml/min (HCC) 01/15/2015   Follows with [facility].  Last visits:  07/23/16: No change in meds. Worried that  if we increase diuretics could lead to another AKI  . CKD (chronic kidney disease), stage IV ([place])    07/07/2017  . Claudication ([place])   . Collapse of left talus 01/14/2017  . COPD GOLD III  02/16/2014   Quit smoking 2014  - PFT's  04/06/13   FEV1 1.28 (44 % ) ratio 48  p no % improvement from saba p ?  prior to study with DLCO  43/54 % corrects to 97  % for alv volume - 01/31/2016  After extensive coaching HFA effectiveness =    75% from baseline 0 > changed to sprivia respimat   - PFT's  03/17/2016  FEV1 2.25  (81 % ) ratio 88  p 2 % improvement from saba p spiriva respimat x 2  prior to study with  . Coronary artery disease   . Diabetes ([place])   . Diabetic polyneuropathy associated with type 2 diabetes mellitus ([place]) 01/14/2017  . Diabetic retinopathy ([place]) 11/13/2015   Per Dr. Erenest Rasher eye exam in 10/17/15, mild background diabetic retinopathy  . Fatigue associated with anemia 01/21/2016  . Gangrene of toe ([place])    right 5th/notes 01/04/2013   . HCAP (healthcare-associated pneumonia)   . Hematuria 06/27/2015  . High cholesterol   . History of tobacco use 08/23/2013   Smoked pack per day for 46 years Quit smoking - 02/03/2013   . Hypertension   . Hypothyroidism 11/22/2014  . Idiopathic chronic gout of left foot without tophus   . Iron deficiency anemia 08/03/2015  .  Left foot pain 10/29/2016  . Long term (current) use of anticoagulants [Z79.01] 09/09/2016  . 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 Mixed hyperlipidemia 07/17/2008  . Morbid obesity due to excess calories ([place]) 03/17/2016  . PAD (peripheral artery disease) ([place])   . Pleural effusion   . Pneumonitis 07/26/2017  . Pulmonary artery hypertension ([place]) 07/25/2017  . PVD - hx of Rt SFA PTA and s/p Rt 4-5th toe amp Dec 2014 04/05/2013  . 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 Respiratory failure ([place]) 08/13/2017  . 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 Secondary hypertension   . Shortness of breath dyspnea    with exertion  .  Skin lesion of face 02/22/2015  . SOB (shortness of breath)   . Type 2 diabetes mellitus with pressure callus ([place]) 12/29/2016  . Type II diabetes mellitus (HCC)    Type II  . Type II diabetes mellitus with complication (HCC)     Medications:  Infusions:  . albuterol 10 mg/hr (09/23/17 0813)  . heparin      Assessment: 89 yom presented to the ED with AMS. Troponin elevated and now starting IV heparin. Baseline Hgb is low at 7.8. No bleeding noted. Head CT negative for bleeding. He is no longer on anticoagulation PTA.  Goal of Therapy:  Heparin level 0.3-0.7 units/ml Monitor platelets by anticoagulation protocol: Yes   Plan:  Heparin bolus 4000 units IV x 1 Heparin gtt 900 units/hr Check a 6 hr heparin level Daily HL and CBC  Leonard [name], [name] 09/23/2017,11:10 AM

## 2017-09-23 NOTE — ED Triage Notes (Signed)
Pt arrives via [facility] from a nursing facility, pt was found unresponsive with yellow froth at the mouth. O2 sats initially in the 70s on RA, ems assisted with ventilations, O2 sats improved and pt placed on NRB with sats maintaining in the high 90's. Pt arousable to verbal stimuli at this time cbg 270, bp 128/76, HR 110.

## 2017-09-23 NOTE — Progress Notes (Signed)
Patient transported to 2W22 on Bipap without complications. RN at bedside.

## 2017-09-23 NOTE — ED Notes (Signed)
Patient being placed on bipap by RRT per Dr. Samson Frederic request

## 2017-09-23 NOTE — Progress Notes (Signed)
Spoke with Cardiology fellow on call to let them know of increasing troponins.  Because patient is still not endorsing chest pain and is on heparin drip, there would be nothing different to do for treatment.  Will continue to monitor.  [place] Constable, D.O.  PGY-1 Family Medicine  09/23/2017 8:37 PM

## 2017-09-23 NOTE — Progress Notes (Addendum)
ANTICOAGULATION CONSULT NOTE - Follow-up Consult  Pharmacy Consult for heparin Indication: chest pain/ACS  Allergies  Allergen Reactions  . Penicillins Other (See Comments)    Has patient had a PCN reaction causing immediate rash, facial/tongue/throat swelling, SOB or lightheadedness with hypotension: Unknown Has patient had a PCN reaction causing severe rash involving mucus membranes or skin necrosis: Unknown Has patient had a PCN reaction that required hospitalization: Unknown Has patient had a PCN reaction occurring within the last 10 years: Unknown If all of the above answers are "NO", then may proceed with Cephalosporin use.     Patient Measurements: Height: 5\' 8"  (172.7 cm) Weight: 160 lb 15 oz (73 kg) IBW/kg (Calculated) : 68.4 Heparin Dosing Weight: 73kg  Vital Signs: BP: 164/81 (08/21 1545) Pulse Rate: 116 (08/21 1618)  Labs: Recent Labs    09/23/17 0745 09/23/17 0810 09/23/17 1639 09/23/17 1847  HGB 7.3* 7.8*  --   --   HCT 25.7* 23.0*  --   --   PLT PLATELET CLUMPS NOTED ON SMEAR, UNABLE TO ESTIMATE  --   --   --   LABPROT 16.9*  --   --   --   INR 1.38  --   --   --   HEPARINUNFRC  --   --   --  0.16*  CREATININE 2.31* 2.00*  --   --   TROPONINI  --   --  3.77*  --     Estimated Creatinine Clearance: 35.2 mL/min (A) (by C-G formula based on SCr of 2 mg/dL (H)).   Medical History: Past Medical History:  Diagnosis Date  . Acute on chronic diastolic heart failure ([place]) 07/04/2014  . Anemia of renal disease   . Atherosclerotic peripheral vascular disease with gangrene ([place]) 02/14/2017  . CAD (coronary artery disease) 04/06/2013  . Cancer ([place])    skin cancer-  . CHF (congestive heart failure) ([place])   . Chronic anemia 02/28/2016  . 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 Chronic respiratory failure with hypoxia ([place]) 08/25/2017  . CKD (chronic kidney disease) stage 4, GFR 15-29 ml/min (HCC) 01/15/2015   Follows with [facility].  Last visits:  07/23/16: No change in meds. Worried that if we increase diuretics could lead to another AKI  . CKD (chronic kidney disease), stage IV ([place])    07/07/2017  . Claudication ([place])   . Collapse of left talus 01/14/2017  . COPD GOLD III  02/16/2014   Quit smoking 2014  - PFT's  04/06/13   FEV1 1.28 (44 % ) ratio 48  p no % improvement from saba p ?  prior to study with DLCO  43/54 % corrects to 97  % for alv volume - 01/31/2016  After extensive coaching HFA effectiveness =    75% from baseline 0 > changed to sprivia respimat   - PFT's  03/17/2016  FEV1 2.25  (81 % ) ratio 88  p 2 % improvement from saba p spiriva respimat x 2  prior to study with  . Coronary artery disease   . Diabetes ([place])   . Diabetic polyneuropathy associated with type 2 diabetes mellitus ([place]) 01/14/2017  . Diabetic retinopathy ([place]) 11/13/2015   Per Dr. Erenest Rasher eye exam in 10/17/15, mild background diabetic retinopathy  . Fatigue associated with anemia 01/21/2016  . Gangrene of toe ([place])    right 5th/notes 01/04/2013   . HCAP (healthcare-associated pneumonia)   . Hematuria 06/27/2015  .  High cholesterol   . History of tobacco use 08/23/2013   Smoked pack per day for 46 years Quit smoking - 02/03/2013   . Hypertension   . Hypothyroidism 11/22/2014  . Idiopathic chronic gout of left foot without tophus   . Iron deficiency anemia 08/03/2015  . Left foot pain 10/29/2016  . Long term (current) use of anticoagulants [Z79.01] 09/09/2016  . 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 Mixed hyperlipidemia 07/17/2008  . Morbid obesity due to excess calories ([place]) 03/17/2016  . PAD (peripheral artery disease) ([place])   . Pleural effusion   . Pneumonitis 07/26/2017  . Pulmonary artery hypertension ([place]) 07/25/2017  . PVD - hx of Rt SFA PTA and s/p Rt 4-5th toe amp Dec 2014 04/05/2013  . 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 Respiratory failure ([place]) 08/13/2017  . 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   . Secondary hypertension   . Shortness of breath dyspnea    with exertion  . Skin lesion of face 02/22/2015  . SOB (shortness of breath)   . Type 2 diabetes mellitus with pressure callus ([place]) 12/29/2016  . Type II diabetes mellitus (HCC)    Type II  . Type II diabetes mellitus with complication (HCC)     Medications:  Infusions:  . albuterol Stopped (09/23/17 1414)  . heparin 900 Units/hr (09/23/17 1133)    Assessment: 15 yom on heparin for ACS. Heparin level subtherapeutic (0.16) on gtt at 900 units/hr. No bleeding noted.   Goal of Therapy:  Heparin level 0.3-0.7 units/ml Monitor platelets by anticoagulation protocol: Yes   Plan:  Rebolus heparin 2000 units IV Increase heparin gtt to 1100 units/hr Check a 6 hr heparin level Daily HL and CBC  Sherlon Handing, PharmD, BCPS Clinical pharmacist 09/23/2017,7:44 PM

## 2017-09-23 NOTE — ED Notes (Signed)
Pt requesting to eat/drink

## 2017-09-23 NOTE — ED Notes (Addendum)
Family updated at bedside .

## 2017-09-24 ENCOUNTER — Inpatient Hospital Stay (HOSPITAL_COMMUNITY): Payer: Medicare Other

## 2017-09-24 DIAGNOSIS — R69 Illness, unspecified: Secondary | ICD-10-CM | POA: Diagnosis present

## 2017-09-24 LAB — GLUCOSE, CAPILLARY
GLUCOSE-CAPILLARY: 128 mg/dL — AB (ref 70–99)
Glucose-Capillary: 131 mg/dL — ABNORMAL HIGH (ref 70–99)
Glucose-Capillary: 151 mg/dL — ABNORMAL HIGH (ref 70–99)
Glucose-Capillary: 160 mg/dL — ABNORMAL HIGH (ref 70–99)

## 2017-09-24 LAB — BLOOD CULTURE ID PANEL (REFLEXED)
Acinetobacter baumannii: NOT DETECTED
CANDIDA ALBICANS: NOT DETECTED
CANDIDA PARAPSILOSIS: NOT DETECTED
CANDIDA TROPICALIS: NOT DETECTED
Candida glabrata: NOT DETECTED
Candida krusei: NOT DETECTED
ENTEROBACTERIACEAE SPECIES: NOT DETECTED
Enterobacter cloacae complex: NOT DETECTED
Enterococcus species: NOT DETECTED
Escherichia coli: NOT DETECTED
HAEMOPHILUS INFLUENZAE: NOT DETECTED
KLEBSIELLA OXYTOCA: NOT DETECTED
KLEBSIELLA PNEUMONIAE: NOT DETECTED
Listeria monocytogenes: NOT DETECTED
METHICILLIN RESISTANCE: DETECTED — AB
Neisseria meningitidis: NOT DETECTED
PROTEUS SPECIES: NOT DETECTED
Pseudomonas aeruginosa: NOT DETECTED
SERRATIA MARCESCENS: NOT DETECTED
STREPTOCOCCUS PYOGENES: NOT DETECTED
Staphylococcus aureus (BCID): NOT DETECTED
Staphylococcus species: DETECTED — AB
Streptococcus agalactiae: NOT DETECTED
Streptococcus pneumoniae: NOT DETECTED
Streptococcus species: NOT DETECTED

## 2017-09-24 LAB — ECHOCARDIOGRAM COMPLETE
Height: 68 in
WEIGHTICAEL: 2574.97 [oz_av]

## 2017-09-24 LAB — HEPARIN LEVEL (UNFRACTIONATED)
Heparin Unfractionated: 0.29 IU/mL — ABNORMAL LOW (ref 0.30–0.70)
Heparin Unfractionated: 0.29 IU/mL — ABNORMAL LOW (ref 0.30–0.70)

## 2017-09-24 LAB — CBC
HCT: 23.2 % — ABNORMAL LOW (ref 39.0–52.0)
Hemoglobin: 7 g/dL — ABNORMAL LOW (ref 13.0–17.0)
MCH: 26.7 pg (ref 26.0–34.0)
MCHC: 30.2 g/dL (ref 30.0–36.0)
MCV: 88.5 fL (ref 78.0–100.0)
PLATELETS: UNDETERMINED 10*3/uL (ref 150–400)
RBC: 2.62 MIL/uL — AB (ref 4.22–5.81)
RDW: 16.2 % — AB (ref 11.5–15.5)
WBC: 6 10*3/uL (ref 4.0–10.5)

## 2017-09-24 LAB — TROPONIN I
TROPONIN I: 2.93 ng/mL — AB (ref <0.03)
Troponin I: 4 ng/mL (ref <0.03)

## 2017-09-24 LAB — BASIC METABOLIC PANEL
ANION GAP: 12 (ref 5–15)
BUN: 40 mg/dL — ABNORMAL HIGH (ref 8–23)
CALCIUM: 8.2 mg/dL — AB (ref 8.9–10.3)
CO2: 22 mmol/L (ref 22–32)
Chloride: 111 mmol/L (ref 98–111)
Creatinine, Ser: 2.05 mg/dL — ABNORMAL HIGH (ref 0.61–1.24)
GFR calc Af Amer: 37 mL/min — ABNORMAL LOW (ref >60)
GFR, EST NON AFRICAN AMERICAN: 32 mL/min — AB (ref >60)
Glucose, Bld: 158 mg/dL — ABNORMAL HIGH (ref 70–99)
POTASSIUM: 3.7 mmol/L (ref 3.5–5.1)
SODIUM: 145 mmol/L (ref 135–145)

## 2017-09-24 MED ORDER — IPRATROPIUM-ALBUTEROL 0.5-2.5 (3) MG/3ML IN SOLN
3.0000 mL | Freq: Three times a day (TID) | RESPIRATORY_TRACT | Status: DC
  Administered 2017-09-24 – 2017-09-25 (×3): 3 mL via RESPIRATORY_TRACT
  Filled 2017-09-24 (×3): qty 3

## 2017-09-24 MED ORDER — ENSURE ENLIVE PO LIQD
237.0000 mL | Freq: Two times a day (BID) | ORAL | Status: DC
  Administered 2017-09-26 – 2017-10-01 (×11): 237 mL via ORAL

## 2017-09-24 MED ORDER — ENOXAPARIN SODIUM 40 MG/0.4ML ~~LOC~~ SOLN
40.0000 mg | SUBCUTANEOUS | Status: DC
  Administered 2017-09-24: 40 mg via SUBCUTANEOUS
  Filled 2017-09-24: qty 0.4

## 2017-09-24 MED ORDER — TIOTROPIUM BROMIDE MONOHYDRATE 18 MCG IN CAPS
18.0000 ug | ORAL_CAPSULE | Freq: Every day | RESPIRATORY_TRACT | Status: DC
  Administered 2017-09-24: 18 ug via RESPIRATORY_TRACT
  Filled 2017-09-24: qty 5

## 2017-09-24 MED ORDER — IPRATROPIUM-ALBUTEROL 0.5-2.5 (3) MG/3ML IN SOLN
3.0000 mL | Freq: Four times a day (QID) | RESPIRATORY_TRACT | Status: DC
  Administered 2017-09-24: 3 mL via RESPIRATORY_TRACT
  Filled 2017-09-24: qty 3

## 2017-09-24 NOTE — Progress Notes (Addendum)
Advanced Heart Failure Rounding Note  PCP-Cardiologist: No primary care provider on file.   Subjective:    Creatinine 2.05, K 3.7. Weight 160 lbs (below recent DC weight of 164 lbs).   Hemoglobin 7.0. Remains on heparin drip. No Denies bleeding.   Troponin 2.11 (POC) > 3.77 > 3.98. Pending add on this am. SBP 150-160s but has only received hydralazine once so far. No other BP meds yet.  Blood cultures positive for coag negative staph (probably contaminant). Not on any abx. WBC normal. Tmax 99.7  Denies CP. SOB is better. +orthopnea. Denies bleeding. Alert and oriented this morning.   Objective:   Weight Range: 73 kg Body mass index is 24.47 kg/m.   Vital Signs:   Temp:  [98.9 F (37.2 C)-99.6 F (37.6 C)] 98.9 F (37.2 C) (08/22 0334) Pulse Rate:  [86-116] 87 (08/22 0352) Resp:  [15-32] 23 (08/22 0352) BP: (144-176)/(67-92) 168/71 (08/22 0352) SpO2:  [92 %-100 %] 92 % (08/22 0619) FiO2 (%):  [30 %] 30 % (08/21 1618) Weight:  [73 kg] 73 kg (08/21 1100)    Weight change: Filed Weights   09/23/17 1100  Weight: 73 kg    Intake/Output:   Intake/Output Summary (Last 24 hours) at 09/24/2017 0924 Last data filed at 09/24/2017 0353 Gross per 24 hour  Intake 591.24 ml  Output 1000 ml  Net -408.76 ml      Physical Exam    General:  Sitting up in bed. No resp difficulty HEENT: Normal anicteric  Neck: Supple. JVP to jaw. Carotids 2+ bilat; no bruits. No lymphadenopathy or thyromegaly appreciated. Cor: PMI nondisplaced. Regular rate & rhythm. 2/6 SEM LUSB Lungs: ronchi throughout  Abdomen: Soft, nontender, nondistended. No hepatosplenomegaly. No bruits or masses. Good bowel sounds. Extremities: No cyanosis, clubbing, rash, RLE 1+ edema, Left AKA Neuro: alert & oriented x 3, cranial nerves grossly intact. moves all 4 extremities w/o difficulty. Affect pleasant    Telemetry   NSR/sinus tach 100s. Personally reviewed.   EKG    No new tracings.   Labs      CBC Recent Labs    09/23/17 0745 09/23/17 0810 09/24/17 0259  WBC 6.9  --  6.0  NEUTROABS 5.7  --   --   HGB 7.3* 7.8* 7.0*  HCT 25.7* 23.0* 23.2*  MCV 92.8  --  88.5  PLT PLATELET CLUMPS NOTED ON SMEAR, UNABLE TO ESTIMATE  --  PLATELET CLUMPS NOTED ON SMEAR, UNABLE TO ESTIMATE   Basic Metabolic Panel Recent Labs    09/23/17 0745 09/23/17 0810 09/24/17 0259  NA 146* 145 145  K 3.7 3.6 3.7  CL 111 110 111  CO2 18*  --  22  GLUCOSE 227* 220* 158*  BUN 33* 33* 40*  CREATININE 2.31* 2.00* 2.05*  CALCIUM 8.2*  --  8.2*   Liver Function Tests Recent Labs    09/23/17 0745  AST 57*  ALT 23  ALKPHOS 145*  BILITOT 1.4*  PROT 5.6*  ALBUMIN 2.6*   No results for input(s): LIPASE, AMYLASE in the last 72 hours. Cardiac Enzymes Recent Labs    09/23/17 1639 09/23/17 1847  TROPONINI 3.77* 3.98*    BNP: BNP (last 3 results) Recent Labs    08/13/17 1050 08/25/17 1001 09/23/17 0745  BNP 334.1* 526.9* 1,815.8*    ProBNP (last 3 results) No results for input(s): PROBNP in the last 8760 hours.   D-Dimer No results for input(s): DDIMER in the last 72 hours. Hemoglobin A1C No  results for input(s): HGBA1C in the last 72 hours. Fasting Lipid Panel No results for input(s): CHOL, HDL, LDLCALC, TRIG, CHOLHDL, LDLDIRECT in the last 72 hours. Thyroid Function Tests No results for input(s): TSH, T4TOTAL, T3FREE, THYROIDAB in the last 72 hours.  Invalid input(s): FREET3  Other results:   Imaging    Ct Head Wo Contrast  Result Date: 09/23/2017 CLINICAL DATA:  Altered level of consciousness. EXAM: CT HEAD WITHOUT CONTRAST TECHNIQUE: Contiguous axial images were obtained from the base of the skull through the vertex without intravenous contrast. COMPARISON:  08/05/2017 FINDINGS: Brain: No evidence of acute infarction, hemorrhage, extra-axial collection, ventriculomegaly, or mass effect. Generalized cerebral atrophy. Periventricular white matter low attenuation likely  secondary to microangiopathy. Vascular: Cerebrovascular atherosclerotic calcifications are noted. Skull: Negative for fracture or focal lesion. Sinuses/Orbits: Visualized portions of the orbits are unremarkable. Mild left mastoid effusion. Bilateral maxillary sinus mucosal thickening. Other: Periapical cyst of maxillary central incisor. IMPRESSION: 1. No acute intracranial pathology. 2. Chronic microvascular disease and cerebral atrophy. Electronically Signed   By: Kathreen Devoid   On: 09/23/2017 10:30      Medications:     Scheduled Medications: . aspirin  81 mg Oral Daily  . atorvastatin  40 mg Oral q1800  . [START ON 09/28/2017] cloNIDine  0.2 mg Transdermal Weekly  . doxazosin  8 mg Oral QPM  . ferrous sulfate  325 mg Oral Q1200  . furosemide  80 mg Intravenous BID  . hydrALAZINE  100 mg Oral Q8H  . insulin aspart  0-9 Units Subcutaneous TID WC  . isosorbide mononitrate  60 mg Oral Daily  . levothyroxine  100 mcg Oral QAC breakfast  . pantoprazole  40 mg Oral Daily     Infusions: . albuterol Stopped (09/23/17 1414)  . heparin 1,200 Units/hr (09/24/17 0618)     PRN Medications:  acetaminophen **OR** acetaminophen, hydrALAZINE, ipratropium-albuterol, nitroGLYCERIN, polyethylene glycol    Patient Profile   Leonard Loudermilk Poseyis a 67 y.o.malewith history of CAD s/p CABG, PAD, chronic diastolic CHF, CKD stage IV, COPD on home oxygen, and HTN.   Admitted from SNF with hypoxic respiratory distress.   Assessment/Plan   1. Acute respiratory failure with hypoxia - Now on baseline O2.  - Lactic acid 3.93 > 1.5.  - Blood cultures positive for coag negative staph (probable contaminate per ID pharmacist). Tmax 99.6 WBC 6.0. Not on any antibiotics. - CXR 8/21 with bilateral interstitial edema.  2. AMS - Head CT negative - Ammonia normal, UA negative, UDS negative - Resolved.   3. Positive troponin with hx of CAD and CABGx3 2015.  - Last LHC prior to CABG.  - Denies s/s  ischemia.  - Troponin 2.11 (POC) > 3.77 > 3.98. Continue to trend until trending down.  - EKG looks unchanged from previous.   - Continue heparin for ? ACS for now. Would like to avoid LHC if possible with CKD. - Continue ASA and statin for now. Hold off on BB for now.    4. Acute on chronic diastolic CHF: [id number]/7408: EF 65-70%, grade 2 DD, mild TR, PA peak pressure 46 mmHg - Volume statusremains elevated.  - Continue 80 mg IV lasix BID.   - Not on spiro with CKD. - Palliative consulted by primary team.  - Repeat echo pending.   5. HTN: - On hydralazine, imdur, cardura, and clonidine at home. -SBP 150-160s. Did not receive any BP meds yesterday and has only received hydralazine so far today. Monitor today. May need  to increase. Has PRN hydral.  6. COPD: Gold stage III -Severe obstruction on prior PFTs. He is back home 2 L [place] this morning.  - Follows with Dr. Melvyn Novas outpatient.   7. CKD: Stage V.- has L AVF. Follows withDr [facility]. Baseline 1.6-2.2 - [place] 2.05 this am. Family requests that nephrology be notified of his admission. Defer to primary team.   8.Hx ofDVT - Diagnosed 08/30/16 - No longer on AC. No change.   9. Hx of pleural effusions R>L  10. Extensive PAD - s/p L AKA for extensive LLE wounds with no re-vascularization options in 03/2017 by Dr. Donzetta Matters. No change.    11. Anemia - Hemoglobin 7.0 this am. Received IV iron last admission. No obvious source of bleeding. Per primary.  12Pulmonary HTN - PA peak pressure 63 mmHg on Echo 07/20/17 - Suspect primarily WHO Group III and WHO Group II pulmonary HTN. Suspect he has mild/modPulm Hypertensionat baselineand pressures wereelevated in the setting of volume overload.  13. Deconditioning - Living at SNF. Limited by Left AKA.   Medication concerns reviewed with patient and pharmacy team. Barriers identified: none at this time  Length of Stay: [place], NP  09/24/2017, 9:24  AM  Advanced Heart Failure Team Pager [phone] (M-F; 7a - 4p)  Please contact [facility] for night-coverage after hours (4p -7a ) and weekends on [email or web address]  Patient seen and examined with the above-signed Advanced Practice Provider and/or Housestaff. I personally reviewed laboratory data, imaging studies and relevant notes. I independently examined the patient and formulated the important aspects of the plan. I have edited the note to reflect any of my changes or salient points. I have personally discussed the plan with the patient and/or family.  Respiratory status much improved with diuresis. BP improving. Troponin peaked at 4.0. Denies CP. Suspect demand ischemia in setting of severe HTN, respiratory distress and HF. EF stable at 50-55%. Creatinine up to 2.0 (not far from baseline. Will continue IV diuresis and BP control. Plan Myoview to evaluate for ischemia. Would only cath if high risk. Continue ASA and statin. Likely start carvedilol tomorrow when respiratory status stable.  Can stop heparin. Start DVT lovenox.  Glori Bickers, MD  9:49 PM

## 2017-09-24 NOTE — Progress Notes (Addendum)
Initial Nutrition Assessment  DOCUMENTATION CODES:   Not applicable  INTERVENTION:    Ensure Enlive po BID, each supplement provides 350 kcal and 20 grams of protein  NUTRITION DIAGNOSIS:   Increased nutrient needs related to chronic illness, wound healing as evidenced by estimated needs  GOAL:   Patient will meet greater than or equal to 90% of their needs  MONITOR:   PO intake, Supplement acceptance, Labs, Skin, Weight trends, I & O's  REASON FOR ASSESSMENT:   Malnutrition Screening Tool  ASSESSMENT:   67 yo Male with PMH of CAD s/p CABG, CKD Stage IV, PAD s/p AKA, and type II diabetes; presented with AMS and respiratory distress, likely due to exacerbation of his significant heart failure.   RD spoke with patient at bedside. He is known to Nutrition team from most recent hospital admission. Hx of intubation on vent support needing enteral/tube feeding.  Pt states his appetite is good but he didn't receive what he ordered for lunch. S/p bedside swallow this AM. SLP recommending Dys 2-thin liquid diet. He does enjoy drinking Ensure Enlive nutrition supplements.  Hx of being followed by [facility] for coccyx wound. Pt confirms wound presence.  Medications include ferrous sulfate, lasix, levothyroxine and protonix. Labs reviewed. BUN 40 (H). Cr 2.05 (H). CBG's [id number].  Verbal with Read Back order received per Dr. Ouida Sills for [facility] consult.  NUTRITION - FOCUSED PHYSICAL EXAM:  Completed. No muscle or fat depletions noticed.  Diet Order:   Diet Order            DIET DYS 2 Room service appropriate? Yes; Fluid consistency: Thin  Diet effective now             EDUCATION NEEDS:   No education needs have been identified at this time  Skin:  Skin Assessment: Skin Integrity Issues: Skin Integrity Issues:: Other (Comment) Other: coccyx wound >> CWOCN assessment pending  Last BM:  PTA  Height:   Ht Readings from Last 1 Encounters:  09/23/17 5\' 8"  (1.727 m)    Weight:   Wt Readings from Last 1 Encounters:  09/23/17 73 kg   BMI:  27.1 kg/m2 >> adjusted for AKA  Estimated Nutritional Needs:   Kcal:  2100-2300  Protein:  100-115 gm  Fluid:  2.1-2.3 L/day  Arthur Holms, RD, LDN Pager #: [phone] After-Hours Pager #: [phone]

## 2017-09-24 NOTE — Clinical Social Work Note (Signed)
Clinical Social Work Assessment  Patient Details  Name: Leonard [name] MRN: [id number] Date of Birth: 1950/12/18  Date of referral:  09/24/17               Reason for consult:  Facility Placement                Permission sought to share information with:  Facility Art therapist granted to share information::  Yes, Verbal Permission Granted  Name::        Agency::  [facility]  Relationship::     Contact Information:     Housing/Transportation Living arrangements for the past 2 months:  [place] of Information:  Patient Patient Interpreter Needed:  None Criminal Activity/Legal Involvement Pertinent to Current Situation/Hospitalization:  No - Comment as needed Significant Relationships:  Siblings, Other Family Members Lives with:  Facility Resident Do you feel safe going back to the place where you live?  Yes Need for family participation in patient care:  No (Coment)  Care giving concerns:  Pt is LTC resident at SNF no caregiving concerns at this time.   Social Worker assessment / plan:  CSW met with pt and confirmed that he is LTC resident at [facility].  CSW discussed plan for time of DC.  Employment status:  Retired Forensic scientist:  Information systems manager, Medicaid In [place] PT Recommendations:  Not assessed at this time [place] / Referral to community resources:  [place]  Patient/Family's Response to care:  Pt is agreeable to return to SNF at time of DC.  Patient/Family's Understanding of and Emotional Response to Diagnosis, Current Treatment, and Prognosis:  No questions or concerns- hopeful that he will be well enough to return to SNF soon.  Emotional Assessment Appearance:  Appears stated age Attitude/Demeanor/Rapport:    Affect (typically observed):  Appropriate Orientation:  Oriented to Self, Oriented to Place, Oriented to  Time, Oriented to Situation Alcohol / Substance use:  Not  Applicable Psych involvement (Current and /or in the community):  No (Comment)  Discharge Needs  Concerns to be addressed:  Care Coordination, Discharge Planning Concerns Readmission within the last 30 days:  Yes Current discharge risk:  Physical Impairment Barriers to Discharge:  Continued Medical Work up   Leonard Ny, LCSW 09/24/2017, 3:17 PM

## 2017-09-24 NOTE — Progress Notes (Signed)
ANTICOAGULATION CONSULT NOTE - Follow-up Consult  Pharmacy Consult for heparin Indication: chest pain/ACS  Allergies  Allergen Reactions  . Penicillins Other (See Comments)    Has patient had a PCN reaction causing immediate rash, facial/tongue/throat swelling, SOB or lightheadedness with hypotension: Unknown Has patient had a PCN reaction causing severe rash involving mucus membranes or skin necrosis: Unknown Has patient had a PCN reaction that required hospitalization: Unknown Has patient had a PCN reaction occurring within the last 10 years: Unknown If all of the above answers are "NO", then may proceed with Cephalosporin use.     Patient Measurements: Height: 5\' 8"  (172.7 cm) Weight: 160 lb 15 oz (73 kg) IBW/kg (Calculated) : 68.4 Heparin Dosing Weight: 73kg  Vital Signs: Temp: 98.9 F (37.2 C) (08/22 0334) Temp Source: Tympanic (08/22 0334) BP: 168/71 (08/22 0352) Pulse Rate: 87 (08/22 0352)  Labs: Recent Labs    09/23/17 0745 09/23/17 0810 09/23/17 1639 09/23/17 1847 09/24/17 0259  HGB 7.3* 7.8*  --   --  7.0*  HCT 25.7* 23.0*  --   --  23.2*  PLT PLATELET CLUMPS NOTED ON SMEAR, UNABLE TO ESTIMATE  --   --   --  PLATELET CLUMPS NOTED ON SMEAR, UNABLE TO ESTIMATE  LABPROT 16.9*  --   --   --   --   INR 1.38  --   --   --   --   HEPARINUNFRC  --   --   --  0.16* 0.29*  CREATININE 2.31* 2.00*  --   --  2.05*  TROPONINI  --   --  3.77* 3.98*  --     Estimated Creatinine Clearance: 34.3 mL/min (A) (by C-G formula based on SCr of 2.05 mg/dL (H)).     Medications:  Infusions:  . albuterol Stopped (09/23/17 1414)  . heparin 1,100 Units/hr (09/23/17 2016)    Assessment: 53 yom on heparin for ACS. Heparin level 0.29 units/ml  Goal of Therapy:  Heparin level 0.3-0.7 units/ml Monitor platelets by anticoagulation protocol: Yes   Plan:  Increase heparin gtt to 1200 units/hr Check a 6 hr heparin level Daily HL and CBC  Excell Seltzer, PharmD Clinical  pharmacist 09/24/2017,4:51 AM

## 2017-09-24 NOTE — Progress Notes (Signed)
ANTICOAGULATION CONSULT NOTE - Initial Consult  Pharmacy Consult for heparin Indication: chest pain/ACS  Allergies  Allergen Reactions  . Penicillins Other (See Comments)    Has patient had a PCN reaction causing immediate rash, facial/tongue/throat swelling, SOB or lightheadedness with hypotension: Unknown Has patient had a PCN reaction causing severe rash involving mucus membranes or skin necrosis: Unknown Has patient had a PCN reaction that required hospitalization: Unknown Has patient had a PCN reaction occurring within the last 10 years: Unknown If all of the above answers are "NO", then may proceed with Cephalosporin use.     Patient Measurements: Height: 5\' 8"  (172.7 cm) Weight: 160 lb 15 oz (73 kg) IBW/kg (Calculated) : 68.4 Heparin Dosing Weight: 73kg  Vital Signs: Temp: 99.3 F (37.4 C) (08/22 1100) Temp Source: Oral (08/22 1100) BP: 152/65 (08/22 1100) Pulse Rate: 107 (08/22 1100)  Labs: Recent Labs    09/23/17 0745 09/23/17 0810 09/23/17 1639 09/23/17 1847 09/24/17 0259 09/24/17 1005  HGB 7.3* 7.8*  --   --  7.0*  --   HCT 25.7* 23.0*  --   --  23.2*  --   PLT PLATELET CLUMPS NOTED ON SMEAR, UNABLE TO ESTIMATE  --   --   --  PLATELET CLUMPS NOTED ON SMEAR, UNABLE TO ESTIMATE  --   LABPROT 16.9*  --   --   --   --   --   INR 1.38  --   --   --   --   --   HEPARINUNFRC  --   --   --  0.16* 0.29* 0.29*  CREATININE 2.31* 2.00*  --   --  2.05*  --   TROPONINI  --   --  3.77* 3.98*  --  4.00*    Estimated Creatinine Clearance: 34.3 mL/min (A) (by C-G formula based on SCr of 2.05 mg/dL (H)).   Medical History: Past Medical History:  Diagnosis Date  . Acute on chronic diastolic heart failure ([place]) 07/04/2014  . Anemia of renal disease   . Atherosclerotic peripheral vascular disease with gangrene ([place]) 02/14/2017  . CAD (coronary artery disease) 04/06/2013  . Cancer ([place])    skin cancer-  . CHF (congestive heart failure) ([place])   . Chronic anemia 02/28/2016   . Chronic deep vein thrombosis (DVT) of femoral vein of left lower extremity ([place]) 09/03/2016  . Chronic respiratory failure with hypoxia ([place]) 08/25/2017  . CKD (chronic kidney disease) stage 4, GFR 15-29 ml/min (HCC) 01/15/2015   Follows with [facility].  Last visits:  07/23/16: No change in meds. Worried that if we increase diuretics could lead to another AKI  . CKD (chronic kidney disease), stage IV ([employer])    07/07/2017  . Claudication ([place])   . Collapse of left talus 01/14/2017  . COPD GOLD III  02/16/2014   Quit smoking 2014  - PFT's  04/06/13   FEV1 1.28 (44 % ) ratio 48  p no % improvement from saba p ?  prior to study with DLCO  43/54 % corrects to 97  % for alv volume - 01/31/2016  After extensive coaching HFA effectiveness =    75% from baseline 0 > changed to sprivia respimat   - PFT's  03/17/2016  FEV1 2.25  (81 % ) ratio 88  p 2 % improvement from saba p spiriva respimat x 2  prior to study with  . Coronary artery disease   . Diabetes ([place])   . Diabetic polyneuropathy associated with  type 2 diabetes mellitus ([place]) 01/14/2017  . Diabetic retinopathy ([place]) 11/13/2015   Per Dr. Erenest Rasher eye exam in 10/17/15, mild background diabetic retinopathy  . Fatigue associated with anemia 01/21/2016  . Gangrene of toe ([place])    right 5th/notes 01/04/2013   . HCAP (healthcare-associated pneumonia)   . Hematuria 06/27/2015  . High cholesterol   . History of tobacco use 08/23/2013   Smoked pack per day for 46 years Quit smoking - 02/03/2013   . Hypertension   . Hypothyroidism 11/22/2014  . Idiopathic chronic gout of left foot without tophus   . Iron deficiency anemia 08/03/2015  . Left foot pain 10/29/2016  . Long term (current) use of anticoagulants [Z79.01] 09/09/2016  . 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 Mixed hyperlipidemia 07/17/2008  . Morbid obesity due to excess calories ([place]) 03/17/2016  . PAD (peripheral artery disease) ([place])   . Pleural effusion   . Pneumonitis  07/26/2017  . Pulmonary artery hypertension ([place]) 07/25/2017  . PVD - hx of Rt SFA PTA and s/p Rt 4-5th toe amp Dec 2014 04/05/2013  . 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 Respiratory failure ([place]) 08/13/2017  . 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 Secondary hypertension   . Shortness of breath dyspnea    with exertion  . Skin lesion of face 02/22/2015  . SOB (shortness of breath)   . Type 2 diabetes mellitus with pressure callus ([place]) 12/29/2016  . Type II diabetes mellitus (HCC)    Type II  . Type II diabetes mellitus with complication (HCC)     Medications:  Infusions:  . albuterol Stopped (09/23/17 1414)  . heparin 1,200 Units/hr (09/24/17 9826)    Assessment: 46 yom presented to the ED with AMS. Troponin elevated and remains on IV heparin for possible ACS. Baseline Hgb is low, now down to 7. No bleeding noted. Head CT negative for bleeding in the ED. He is no longer on anticoagulation PTA.  Heparin level slightly subtherapeutic at 0.29 on 1,200 units/hr  Goal of Therapy:  Heparin level 0.3-0.7 units/ml Monitor platelets by anticoagulation protocol: Yes   Plan:  Increase heparin drip to 1300 units/hr Check a 6 hr heparin level Daily heparin level and CBC  Vertis Kelch, PharmD PGY1 Pharmacy Resident Phone [phone] 09/24/2017       11:52 AM

## 2017-09-24 NOTE — NC FL2 (Signed)
[place] MEDICAID FL2 LEVEL OF CARE SCREENING TOOL     IDENTIFICATION  [name] Birthdate: 09/19/1950 Sex: male Admission Date (Current Location): 09/23/2017  [facility] and [place] Number:  Herbalist and Address:  [facility] [place] [address] [place], [place] [address]      Provider Number: [id number]  Attending Physician Name and Address:  Martyn Malay, MD  Relative Name and Phone Number:  [name], sister, [phone]    Current Level of Care: Hospital Recommended Level of Care: [place] Prior Approval Number:    Date Approved/Denied:   PASRR Number: [id number])  Discharge Plan: SNF    Current Diagnoses: Patient Active Problem List   Diagnosis Date Noted  . Severe comorbid illness   . Respiratory failure with hypoxia ([place]) 09/23/2017  . Moderate malnutrition ([place]) 08/27/2017  . Hypoxemia   . Atelectasis   . HCAP (healthcare-associated pneumonia)   . Chronic respiratory failure with hypoxia ([place]) 08/25/2017  . Pulmonary artery hypertension ([place]) 07/25/2017  . Pressure injury of skin 07/20/2017  . Diabetic polyneuropathy associated with type 2 diabetes mellitus ([place]) 01/14/2017  . Pleural effusion   . Chronic anemia 02/28/2016  . Diabetic retinopathy ([place]) 11/13/2015  . Iron deficiency anemia 08/03/2015  . Type II diabetes mellitus with complication ([place])   . 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 Deficiency anemia 01/15/2015  . CKD (chronic kidney disease) stage 4, GFR 15-29 ml/min (HCC) 01/15/2015  . Hypothyroidism 11/22/2014  . Acute on chronic diastolic congestive heart failure ([place]) 07/04/2014  . COPD GOLD III  02/16/2014  . Acute respiratory failure with hypoxia ([place])   . Anemia of renal disease   . History of tobacco use 08/23/2013  . 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 CAD (coronary artery disease) 04/06/2013  . Acute respiratory failure ([place]) 04/06/2013  .  PVD - hx of Rt SFA PTA and s/p Rt 4-5th toe amp Dec 2014 04/05/2013  . 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 Mixed hyperlipidemia 07/17/2008    Orientation RESPIRATION BLADDER Height & Weight     Self, Time, Situation, Place  O2(2 liters [place]) Incontinent Weight: 160 lb 15 oz (73 kg) Height:  5\' 8"  (172.7 cm)  BEHAVIORAL SYMPTOMS/MOOD NEUROLOGICAL BOWEL NUTRITION STATUS      Incontinent Diet(see DC summary)  AMBULATORY STATUS COMMUNICATION OF NEEDS Skin   Extensive Assist Verbally Normal                       Personal Care Assistance Level of Assistance  Bathing, Feeding, Dressing Bathing Assistance: Maximum assistance Feeding assistance: Limited assistance Dressing Assistance: Maximum assistance     Functional Limitations Info             SPECIAL CARE FACTORS FREQUENCY        PT Frequency: (5/wk) OT Frequency: (5/wk)            Contractures Contractures Info: Not present    Additional Factors Info  Code Status, Allergies Code Status Info: (Full) Allergies Info: ( Penicillins)   Insulin Sliding Scale Info: (insullin daily)       Current Medications (09/24/2017):  This is the current hospital active medication list Current Facility-Administered Medications  Medication Dose Route Frequency Provider Last Rate Last Dose  . acetaminophen (TYLENOL) tablet 650 mg  650 mg Oral Q6H PRN Orson Eva J, DO       Or  . acetaminophen (TYLENOL) suppository 650  mg  650 mg Rectal Q6H PRN Bufford Lope, DO      . albuterol (PROVENTIL,VENTOLIN) solution continuous neb  10 mg/hr Nebulization Continuous Bufford Lope, DO   Stopped at 09/23/17 1414  . aspirin chewable tablet 81 mg  81 mg Oral Daily Bufford Lope, DO   81 mg at 09/24/17 0851  . atorvastatin (LIPITOR) tablet 40 mg  40 mg Oral q1800 Bufford Lope, DO      . [START ON 09/28/2017] cloNIDine (CATAPRES - Dosed in mg/24 hr) patch 0.2 mg  0.2 mg Transdermal Weekly Shawna Orleans, Elsia J, DO      . doxazosin (CARDURA) tablet 8 mg  8 mg  Oral QPM Shawna Orleans, Elsia J, DO      . ferrous sulfate tablet 325 mg  325 mg Oral Q1200 Orson Eva J, DO   325 mg at 09/24/17 1314  . furosemide (LASIX) injection 80 mg  80 mg Intravenous BID Orson Eva J, DO   80 mg at 09/24/17 0851  . heparin ADULT infusion 100 units/mL (25000 units/271mL sodium chloride 0.45%)  1,300 Units/hr Intravenous Continuous Ronna Polio, RPH 13 mL/hr at 09/24/17 1320 1,300 Units/hr at 09/24/17 1320  . hydrALAZINE (APRESOLINE) injection 5 mg  5 mg Intravenous Q4H PRN Orson Eva J, DO      . hydrALAZINE (APRESOLINE) tablet 100 mg  100 mg Oral Q8H Martyn Malay, MD   100 mg at 09/24/17 1314  . insulin aspart (novoLOG) injection 0-9 Units  0-9 Units Subcutaneous TID WC Orson Eva J, DO   1 Units at 09/24/17 1314  . ipratropium-albuterol (DUONEB) 0.5-2.5 (3) MG/3ML nebulizer solution 3 mL  3 mL Nebulization TID Martyn Malay, MD   3 mL at 09/24/17 1438  . isosorbide mononitrate (IMDUR) 24 hr tablet 60 mg  60 mg Oral Daily Shawna Orleans, Elsia J, DO   60 mg at 09/24/17 0850  . levothyroxine (SYNTHROID, LEVOTHROID) tablet 100 mcg  100 mcg Oral QAC breakfast Bufford Lope, DO   100 mcg at 09/24/17 0615  . nitroGLYCERIN (NITROSTAT) SL tablet 0.4 mg  0.4 mg Sublingual Q5 min PRN Bufford Lope, DO      . pantoprazole (PROTONIX) EC tablet 40 mg  40 mg Oral Daily Orson Eva J, DO   40 mg at 09/24/17 0850  . polyethylene glycol (MIRALAX / GLYCOLAX) packet 17 g  17 g Oral Daily PRN Bufford Lope, DO       Facility-Administered Medications Ordered in Other Encounters  Medication Dose Route Frequency Provider Last Rate Last Dose  . sodium chloride flush (NS) 0.9 % injection 10 mL  10 mL Intracatheter PRN Heath Lark, MD         Discharge Medications: Please see discharge summary for a list of discharge medications.  Relevant Imaging Results:  Relevant Lab Results:   Additional Information SS#: [id number]  Jorge Ny, LCSW

## 2017-09-24 NOTE — Progress Notes (Signed)
PHARMACY - PHYSICIAN COMMUNICATION CRITICAL VALUE ALERT - BLOOD CULTURE IDENTIFICATION (BCID)  Leonard [name] is an 67 y.o. male who presented to [facility] on 09/23/2017 with a chief complaint of AMS  Assessment:  1/2 BC with MRSE  Name of physician (or Provider) Contacted: Intern pager [phone] Current antibiotics: none  Changes to prescribed antibiotics recommended:  No changes recommended.  Observe off antibiotics  Results for orders placed or performed during the hospital encounter of 09/23/17  Blood Culture ID Panel (Reflexed) (Collected: 09/23/2017  8:02 AM)  Result Value Ref Range   Enterococcus species NOT DETECTED NOT DETECTED   Listeria monocytogenes NOT DETECTED NOT DETECTED   Staphylococcus species DETECTED (A) NOT DETECTED   Staphylococcus aureus NOT DETECTED NOT DETECTED   Methicillin resistance DETECTED (A) NOT DETECTED   Streptococcus species NOT DETECTED NOT DETECTED   Streptococcus agalactiae NOT DETECTED NOT DETECTED   Streptococcus pneumoniae NOT DETECTED NOT DETECTED   Streptococcus pyogenes NOT DETECTED NOT DETECTED   Acinetobacter baumannii NOT DETECTED NOT DETECTED   Enterobacteriaceae species NOT DETECTED NOT DETECTED   Enterobacter cloacae complex NOT DETECTED NOT DETECTED   Escherichia coli NOT DETECTED NOT DETECTED   Klebsiella oxytoca NOT DETECTED NOT DETECTED   Klebsiella pneumoniae NOT DETECTED NOT DETECTED   Proteus species NOT DETECTED NOT DETECTED   Serratia marcescens NOT DETECTED NOT DETECTED   Haemophilus influenzae NOT DETECTED NOT DETECTED   Neisseria meningitidis NOT DETECTED NOT DETECTED   Pseudomonas aeruginosa NOT DETECTED NOT DETECTED   Candida albicans NOT DETECTED NOT DETECTED   Candida glabrata NOT DETECTED NOT DETECTED   Candida krusei NOT DETECTED NOT DETECTED   Candida parapsilosis NOT DETECTED NOT DETECTED   Candida tropicalis NOT DETECTED NOT DETECTED    [name] 09/24/2017  4:03 AM

## 2017-09-24 NOTE — Evaluation (Signed)
Clinical/Bedside Swallow Evaluation Patient Details  Name: Leonard [name] MRN: [id number] Date of Birth: 04-20-1950  Today's Date: 09/24/2017 Time: SLP Start Time (ACUTE ONLY): 1133 SLP Stop Time (ACUTE ONLY): 1149 SLP Time Calculation (min) (ACUTE ONLY): 16 min  Past Medical History:  Past Medical History:  Diagnosis Date  . Acute on chronic diastolic heart failure ([place]) 07/04/2014  . Anemia of renal disease   . Atherosclerotic peripheral vascular disease with gangrene ([place]) 02/14/2017  . CAD (coronary artery disease) 04/06/2013  . Cancer ([place])    skin cancer-  . CHF (congestive heart failure) ([place])   . Chronic anemia 02/28/2016  . Chronic deep vein thrombosis (DVT) of femoral vein of left lower extremity ([place]) 09/03/2016  . Chronic respiratory failure with hypoxia ([place]) 08/25/2017  . CKD (chronic kidney disease) stage 4, GFR 15-29 ml/min (HCC) 01/15/2015   Follows with [facility].  Last visits:  07/23/16: No change in meds. Worried that if we increase diuretics could lead to another AKI  . CKD (chronic kidney disease), stage IV ([place])    07/07/2017  . Claudication ([place])   . Collapse of left talus 01/14/2017  . COPD GOLD III  02/16/2014   Quit smoking 2014  - PFT's  04/06/13   FEV1 1.28 (44 % ) ratio 48  p no % improvement from saba p ?  prior to study with DLCO  43/54 % corrects to 97  % for alv volume - 01/31/2016  After extensive coaching HFA effectiveness =    75% from baseline 0 > changed to sprivia respimat   - PFT's  03/17/2016  FEV1 2.25  (81 % ) ratio 88  p 2 % improvement from saba p spiriva respimat x 2  prior to study with  . Coronary artery disease   . Diabetes ([place])   . Diabetic polyneuropathy associated with type 2 diabetes mellitus ([place]) 01/14/2017  . Diabetic retinopathy ([place]) 11/13/2015   Per Dr. Erenest Rasher eye exam in 10/17/15, mild background diabetic retinopathy  . Fatigue associated with anemia 01/21/2016  . Gangrene of toe ([place])    right 5th/notes 01/04/2013   . HCAP  (healthcare-associated pneumonia)   . Hematuria 06/27/2015  . High cholesterol   . History of tobacco use 08/23/2013   Smoked pack per day for 46 years Quit smoking - 02/03/2013   . Hypertension   . Hypothyroidism 11/22/2014  . Idiopathic chronic gout of left foot without tophus   . Iron deficiency anemia 08/03/2015  . Left foot pain 10/29/2016  . Long term (current) use of anticoagulants [Z79.01] 09/09/2016  . 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 Mixed hyperlipidemia 07/17/2008  . Morbid obesity due to excess calories ([place]) 03/17/2016  . PAD (peripheral artery disease) ([place])   . Pleural effusion   . Pneumonitis 07/26/2017  . Pulmonary artery hypertension ([place]) 07/25/2017  . PVD - hx of Rt SFA PTA and s/p Rt 4-5th toe amp Dec 2014 04/05/2013  . 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 Respiratory failure ([place]) 08/13/2017  . 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 Secondary hypertension   . Shortness of breath dyspnea    with exertion  . Skin lesion of face 02/22/2015  . SOB (shortness of breath)   . 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 Type II diabetes mellitus (HCC)    Type II  . Type II diabetes mellitus with complication [facility])    Past Surgical History:  Past Surgical History:  Procedure  Laterality Date  . ABDOMINAL AORTOGRAM W/LOWER EXTREMITY N/A 03/12/2017   Procedure: ABDOMINAL AORTOGRAM W/LOWER EXTREMITY;  Surgeon: Conrad Libertyville, MD;  Location: [facility] CV LAB;  Service: Cardiovascular;  Laterality: N/A;  . AMPUTATION Right 01/28/2013   Procedure: RAY AMPUTATION RIGHT  4th & 5th TOE;  Surgeon: Rosetta Posner, MD;  Location: [place];  Service: Vascular;  Laterality: Right;  . AMPUTATION Left 03/15/2017   Procedure: AMPUTATION ABOVE KNEE;  Surgeon: Waynetta Sandy, MD;  Location: [place];  Service: Vascular;  Laterality: Left;  . ANGIOPLASTY / STENTING FEMORAL Right 01/13/2013   superficial femoral artery x 2 (3 mm x 60 mm, 4 mm x 60 mm)  .  AV FISTULA PLACEMENT Left 02/11/2016   Procedure: LEFT UPPER ARM BRACHIAL CEPHALIC ARTERIOVENOUS (AV) FISTULA CREATION;  Surgeon: Conrad Trevose, MD;  Location: [place];  Service: Vascular;  Laterality: Left;  . COLONOSCOPY W/ POLYPECTOMY    . CORONARY ARTERY BYPASS GRAFT N/A 04/07/2013   Procedure: CORONARY ARTERY BYPASS GRAFTING (CABG);  Surgeon: Ivin Poot, MD;  Location: [place];  Service: Open Heart Surgery;  Laterality: N/A;  . INTRAOPERATIVE TRANSESOPHAGEAL ECHOCARDIOGRAM N/A 04/07/2013   Procedure: INTRAOPERATIVE TRANSESOPHAGEAL ECHOCARDIOGRAM;  Surgeon: Ivin Poot, MD;  Location: [place];  Service: Open Heart Surgery;  Laterality: N/A;  . LEFT HEART CATHETERIZATION WITH CORONARY ANGIOGRAM N/A 04/05/2013   Procedure: LEFT HEART CATHETERIZATION WITH CORONARY ANGIOGRAM;  Surgeon: Peter M Martinique, MD;  Location: [facility] CATH LAB;  Service: Cardiovascular;  Laterality: N/A;  . LOWER EXTREMITY ANGIOGRAM Bilateral 01/06/2013   Procedure: LOWER EXTREMITY ANGIOGRAM;  Surgeon: Conrad Wedgefield, MD;  Location: [facility] CATH LAB;  Service: Cardiovascular;  Laterality: Bilateral;  . LOWER EXTREMITY ANGIOGRAM Right 01/13/2013   Procedure: LOWER EXTREMITY ANGIOGRAM;  Surgeon: Conrad Anton, MD;  Location: [facility] CATH LAB;  Service: Cardiovascular;  Laterality: Right;  . SKIN CANCER EXCISION  2017  . THROAT SURGERY  1983   polyps  . TONSILLECTOMY AND ADENOIDECTOMY  ~ 1965   HPI:  Pt is a 67 y.o. male admitted with AMS, acute respiratory failure initially requiring BiPAP, and elevated troponin. CXR suggestive of interstitial edema; CT head negative. Pt was seen by SLP during recent admission in June 2019, initially NPO after self-extubation but advancing to Dys 2 diet, thin liquids prior to discharge. PMH: CAD s/p CABG, PAD, chronic diastolic CHF, CKD stage IV, COPD on home oxygen, and HTN   Assessment / Plan / Recommendation Clinical Impression  Pt is impulsive, requiring assistance with feeding to slow his rate. Even with  larger amounts of intake, the only time he coughed was when he was trying to clear a piece of cracker from his mouth, using a liquid wash. Mastication is moderately prolonged in the setting of no dentition. Recommend Dys 2 diet, which is most consistent with what pt/sister describe that he eats at SNF. Thin liquids are okay, but would avoid mixing solids and liquids together. SLP will f/u for tolerance. SLP Visit Diagnosis: Dysphagia, unspecified (R13.10)    Aspiration Risk  Mild aspiration risk;Moderate aspiration risk    Diet Recommendation Dysphagia 2 (Fine chop);Thin liquid   Liquid Administration via: Cup;Straw Medication Administration: Whole meds with puree Supervision: Patient able to self feed;Full supervision/cueing for compensatory strategies Compensations: Slow rate;Small sips/bites;Minimize environmental distractions Postural Changes: Seated upright at 90 degrees    Other  Recommendations Oral Care Recommendations: Oral care BID   Follow up Recommendations Skilled Nursing facility  Frequency and Duration min 2x/week  2 weeks       Prognosis Prognosis for Safe Diet Advancement: (baseline diet)      Swallow Study   General HPI: Pt is a 67 y.o. male admitted with AMS, acute respiratory failure initially requiring BiPAP, and elevated troponin. CXR suggestive of interstitial edema; CT head negative. Pt was seen by SLP during recent admission in June 2019, initially NPO after self-extubation but advancing to Dys 2 diet, thin liquids prior to discharge. PMH: CAD s/p CABG, PAD, chronic diastolic CHF, CKD stage IV, COPD on home oxygen, and HTN Type of Study: Bedside Swallow Evaluation Previous Swallow Assessment: see HPI Diet Prior to this Study: Regular;Thin liquids Temperature Spikes Noted: No Respiratory Status: Nasal cannula History of Recent Intubation: No Behavior/Cognition: Alert;Cooperative;Pleasant mood;Impulsive;Requires cueing Oral Cavity Assessment: Within  Functional Limits Oral Care Completed by SLP: No Oral Cavity - Dentition: Edentulous Vision: Functional for self-feeding Self-Feeding Abilities: Able to feed self;Needs assist(due to impulsivity) Patient Positioning: Upright in bed Baseline Vocal Quality: Normal Volitional Cough: Strong Volitional Swallow: Unable to elicit(coughs instead of swallowing)    Oral/Motor/Sensory Function Overall Oral Motor/Sensory Function: Within functional limits   Ice Chips Ice chips: Not tested   Thin Liquid Thin Liquid: Impaired Presentation: Cup;Self Fed;Straw Pharyngeal  Phase Impairments: Cough - Immediate(x1)    Nectar Thick Nectar Thick Liquid: Not tested   Honey Thick Honey Thick Liquid: Not tested   Puree Puree: Within functional limits Presentation: Spoon   Solid     Solid: Impaired Presentation: Self Fed Oral Phase Impairments: Impaired mastication Oral Phase Functional Implications: Prolonged oral transit      [name] 09/24/2017,12:08 PM  [name], M.A. CCC-SLP [phone]

## 2017-09-24 NOTE — Progress Notes (Signed)
  Echocardiogram 2D Echocardiogram has been performed.  Leonard [name] M 09/24/2017, 10:57 AM

## 2017-09-24 NOTE — Progress Notes (Signed)
Family Medicine Teaching Service Daily Progress Note Intern Pager: [phone]  [name] Medical record number: [id number] Date of birth: July 24, 1950 Age: 67 y.o. Gender: male  Primary Care Provider: Kathrene Alu, MD  Code Status: full Consultations: heart failure Admission date: 8/21  Pt Overview and Major Events to Date:  8/21 admitted for acute respiratory failure 2/2 CHF exacerbation NSTEMI 8/22 off BiPAP  Subjective: Overnight: troponins continued to rise to 3.98, cards fellow consulted Today: Patient taken off BiPAP. No respiratory distress. He states he feels like he's at his baseline. He is on 2L [place] oxygen which is his baseline. He had one BM this morning in bed and another prior to my exam which he did not realize. It looked loose with some redness, possible blood.  Objective: Temp:  [97.8 F (36.6 C)-99.6 F (37.6 C)] 98.9 F (37.2 C) (08/22 0334) Pulse Rate:  [86-116] 87 (08/22 0352) Resp:  [15-32] 23 (08/22 0352) BP: (144-176)/(67-92) 168/71 (08/22 0352) SpO2:  [92 %-100 %] 92 % (08/22 0619) FiO2 (%):  [30 %-50 %] 30 % (08/21 1618) Weight:  [73 kg] 73 kg (08/21 1100)  Physical Exam: General: resting comfortably, NAD Cardiovascular: fast rate, abnormal rhythm- possible S3 gallop, sternal surgical scar Respiratory: clear to auscultation bilateral with occasional mild rhonchi on right upper lobe Abdomen: soft, nontender to palpation Extremities: right leg well perfused, trace edema. Left leg BKA with moderate edema, good perfusion GI: patient had stool in bed on exam- watery, possible blood Psych: alert and oriented GU: cath in place- yellow urine in bag  Assessment and Plan: Leonard [name] is a 67 y.o. male presenting with AMS. PMH is significant for HFpEF, COPD on home oxygen, CAD s/p CABG, PAD, CKD IV, HTN, T2DM, HLD, hypothyroidism.   Acute Hypoxic Respiratory Failure 2/2 CHF exacerbation and NSTEMI s/p CABG Oxygen saturation high  90's on BiPAP, HR 111, resp 23, 168/71 BNP 1815 CXR showed interstitial edema. Echo on admission unchanged from previous. Last echo June 2019 with EF 65-70% and PA peak pressure 63 mmHg. lactic acid 3.93>1.5 trending down.troponins elevating 3.98. Heart failure consulted - monitor on telemetry - follow BCx and Ux - HF cardiology following, appreciate recommendations - IV lasix 80 BID  - IV KCl 40 mEq x1 -heparin drip 100units/mL -continue ASA and statin, hold BB - Keep O2 sat 88-92% given COPD GOLD III and high O2 sat can depress respiratory drive   - continue home  glucopyrrolate BID - duonebs q4prn - daily wts, strict I/Os  AMS. Resolved. CT head negative for acute intracranial pathology. Patient is back to baseline today. He does not remember the event at all. Last thing he recalls was feeling well and laying in bed. Ammonia 10. Urinalysis showed >300 protein, trace leukocytes, negative nitrites, many bacteria. UDS was negative - continue to monitor  Elevated troponin in setting of CAD s/p CABG. Likely NSTEMI vs demand from respiratory distress. Trop increased to 3.98 without ST changes on EKG. Denies chest pain - ASA 300mg  suppository x1 - continue home ASA81 and atorvastatin - heparin drip, monitoring levels daily - trend troponins - am EKG  HTN. At home on coreg, hydralazine, imdur, doxazosin, clonidine. BP 168/71 - continue home medications - monitor vitals  T2DM. Last a1c 6.9 on 07/05/17. 158 this morning - monitor CBGs - sSSI  CKD with anemia, stable. Baseline Cr 1.7-2.3. Baseline Hgb 7.5. Today: Hgb 7.0, MCV 88.5, Cr 2.05, asymptomatic - monitor BMP, CBC - continue home ferrous  sulfate  BPH - continue home doxazosin  Hypothyroidism last TSH 3.338 on 07/10/17 - continue home synthroid   FEN/GI: NPO, protonix Prophylaxis: heparin gtt   Disposition: admit to step down   Laboratory: Recent Labs  Lab 09/23/17 0745 09/23/17 0810 09/24/17 0259  WBC 6.9   --  6.0  HGB 7.3* 7.8* 7.0*  HCT 25.7* 23.0* 23.2*  PLT PLATELET CLUMPS NOTED ON SMEAR, UNABLE TO ESTIMATE  --  PLATELET CLUMPS NOTED ON SMEAR, UNABLE TO ESTIMATE   Recent Labs  Lab 09/23/17 0745 09/23/17 0810 09/24/17 0259  NA 146* 145 145  K 3.7 3.6 3.7  CL 111 110 111  CO2 18*  --  22  BUN 33* 33* 40*  CREATININE 2.31* 2.00* 2.05*  CALCIUM 8.2*  --  8.2*  PROT 5.6*  --   --   BILITOT 1.4*  --   --   ALKPHOS 145*  --   --   ALT 23  --   --   AST 57*  --   --   GLUCOSE 227* 220* 158*  heparin level 0.29   Imaging/Diagnostic Tests: Head CT- no acute intracranial pathology  Leonard Osmond, DO 09/24/2017, 6:43 AM PGY-1, [place] Intern pager: [phone], text pages welcome

## 2017-09-25 ENCOUNTER — Inpatient Hospital Stay (HOSPITAL_COMMUNITY): Payer: Medicare Other

## 2017-09-25 DIAGNOSIS — I5033 Acute on chronic diastolic (congestive) heart failure: Secondary | ICD-10-CM

## 2017-09-25 LAB — NM MYOCAR MULTI W/SPECT W/WALL MOTION / EF
CHL CUP RESTING HR STRESS: 86 {beats}/min
CSEPEW: 1 METS
Exercise duration (min): 5 min
MPHR: 154 {beats}/min
Peak HR: 92 {beats}/min
Percent HR: 59 %

## 2017-09-25 LAB — CBC
HCT: 21.4 % — ABNORMAL LOW (ref 39.0–52.0)
HCT: 22.5 % — ABNORMAL LOW (ref 39.0–52.0)
HEMOGLOBIN: 7 g/dL — AB (ref 13.0–17.0)
Hemoglobin: 6.6 g/dL — CL (ref 13.0–17.0)
MCH: 26.9 pg (ref 26.0–34.0)
MCH: 27.2 pg (ref 26.0–34.0)
MCHC: 30.8 g/dL (ref 30.0–36.0)
MCHC: 31.1 g/dL (ref 30.0–36.0)
MCV: 87.3 fL (ref 78.0–100.0)
MCV: 87.5 fL (ref 78.0–100.0)
PLATELETS: UNDETERMINED 10*3/uL (ref 150–400)
Platelets: UNDETERMINED 10*3/uL (ref 150–400)
RBC: 2.45 MIL/uL — AB (ref 4.22–5.81)
RBC: 2.57 MIL/uL — AB (ref 4.22–5.81)
RDW: 16.2 % — ABNORMAL HIGH (ref 11.5–15.5)
RDW: 15.8 % — ABNORMAL HIGH (ref 11.5–15.5)
WBC: 3.9 10*3/uL — ABNORMAL LOW (ref 4.0–10.5)
WBC: 4 10*3/uL (ref 4.0–10.5)

## 2017-09-25 LAB — GLUCOSE, CAPILLARY
GLUCOSE-CAPILLARY: 136 mg/dL — AB (ref 70–99)
GLUCOSE-CAPILLARY: 153 mg/dL — AB (ref 70–99)
Glucose-Capillary: 150 mg/dL — ABNORMAL HIGH (ref 70–99)

## 2017-09-25 LAB — URINE CULTURE

## 2017-09-25 LAB — BASIC METABOLIC PANEL
Anion gap: 8 (ref 5–15)
BUN: 34 mg/dL — AB (ref 8–23)
CO2: 25 mmol/L (ref 22–32)
Calcium: 8.1 mg/dL — ABNORMAL LOW (ref 8.9–10.3)
Chloride: 110 mmol/L (ref 98–111)
Creatinine, Ser: 1.7 mg/dL — ABNORMAL HIGH (ref 0.61–1.24)
GFR calc Af Amer: 47 mL/min — ABNORMAL LOW (ref >60)
GFR, EST NON AFRICAN AMERICAN: 40 mL/min — AB (ref >60)
GLUCOSE: 146 mg/dL — AB (ref 70–99)
POTASSIUM: 3.2 mmol/L — AB (ref 3.5–5.1)
Sodium: 143 mmol/L (ref 135–145)

## 2017-09-25 LAB — PREPARE RBC (CROSSMATCH)

## 2017-09-25 MED ORDER — REGADENOSON 0.4 MG/5ML IV SOLN
INTRAVENOUS | Status: AC
  Filled 2017-09-25: qty 5

## 2017-09-25 MED ORDER — TECHNETIUM TC 99M TETROFOSMIN IV KIT
30.0000 | PACK | Freq: Once | INTRAVENOUS | Status: AC | PRN
  Administered 2017-09-25: 30 via INTRAVENOUS

## 2017-09-25 MED ORDER — TECHNETIUM TC 99M TETROFOSMIN IV KIT
10.0000 | PACK | Freq: Once | INTRAVENOUS | Status: AC | PRN
  Administered 2017-09-25: 10 via INTRAVENOUS

## 2017-09-25 MED ORDER — ISOSORBIDE MONONITRATE ER 60 MG PO TB24
120.0000 mg | ORAL_TABLET | Freq: Every day | ORAL | Status: DC
  Administered 2017-09-25 – 2017-10-01 (×7): 120 mg via ORAL
  Filled 2017-09-25 (×7): qty 2

## 2017-09-25 MED ORDER — IPRATROPIUM-ALBUTEROL 0.5-2.5 (3) MG/3ML IN SOLN
3.0000 mL | Freq: Four times a day (QID) | RESPIRATORY_TRACT | Status: DC
  Administered 2017-09-25: 3 mL via RESPIRATORY_TRACT
  Filled 2017-09-25 (×2): qty 3

## 2017-09-25 MED ORDER — POTASSIUM CHLORIDE CRYS ER 20 MEQ PO TBCR
40.0000 meq | EXTENDED_RELEASE_TABLET | Freq: Two times a day (BID) | ORAL | Status: DC
  Administered 2017-09-25 – 2017-09-29 (×9): 40 meq via ORAL
  Filled 2017-09-25 (×11): qty 2

## 2017-09-25 MED ORDER — SODIUM CHLORIDE 0.9% IV SOLUTION: Freq: Once | INTRAVENOUS | Status: DC

## 2017-09-25 MED ORDER — REGADENOSON 0.4 MG/5ML IV SOLN
0.4000 mg | Freq: Once | INTRAVENOUS | Status: AC
  Administered 2017-09-25: 0.4 mg via INTRAVENOUS
  Filled 2017-09-25: qty 5

## 2017-09-25 MED ORDER — CARVEDILOL 6.25 MG PO TABS
6.2500 mg | ORAL_TABLET | Freq: Two times a day (BID) | ORAL | Status: DC
  Administered 2017-09-26 – 2017-09-27 (×4): 6.25 mg via ORAL
  Filled 2017-09-25 (×4): qty 1

## 2017-09-25 MED ORDER — FUROSEMIDE 10 MG/ML IJ SOLN
80.0000 mg | Freq: Three times a day (TID) | INTRAMUSCULAR | Status: DC
  Administered 2017-09-25 – 2017-09-27 (×7): 80 mg via INTRAVENOUS
  Filled 2017-09-25 (×7): qty 8

## 2017-09-25 MED ORDER — PANTOPRAZOLE SODIUM 40 MG PO TBEC
40.0000 mg | DELAYED_RELEASE_TABLET | Freq: Two times a day (BID) | ORAL | Status: DC
  Administered 2017-09-25 – 2017-10-01 (×12): 40 mg via ORAL
  Filled 2017-09-25 (×12): qty 1

## 2017-09-25 NOTE — Progress Notes (Addendum)
Advanced Heart Failure Rounding Note  PCP-Cardiologist: No primary care provider on file.   Subjective:   Yesterday diuresed with IV lasix. I/O not accurate. No weight yesterday.   Denies SOB/chest pain.   Creatinine peaked 2.05>1.7  Hemoglobin down to 6.6 . Receiving blood.    Troponin Peak 4> 2.9. Denies CP or SOB. SBP still 130-160 range  Blood cultures positive for coag negative staph (probably contaminant). Not on any abx. WBC normal. Tmax 99.7   Objective:   Weight Range: 80.6 kg Body mass index is 27.02 kg/m.   Vital Signs:   Temp:  [98.4 F (36.9 C)-99.3 F (37.4 C)] 98.4 F (36.9 C) (08/23 0500) Pulse Rate:  [87-107] 93 (08/23 0500) Resp:  [18-28] 25 (08/23 0500) BP: (151-172)/(65-72) 153/65 (08/23 0500) SpO2:  [93 %-100 %] 99 % (08/23 0600) Weight:  [80.6 kg] 80.6 kg (08/23 0500)    Weight change: Filed Weights   09/23/17 1100 09/25/17 0500  Weight: 73 kg 80.6 kg    Intake/Output:   Intake/Output Summary (Last 24 hours) at 09/25/2017 0642 Last data filed at 09/25/2017 0500 Gross per 24 hour  Intake 720 ml  Output 801 ml  Net -81 ml      Physical Exam   General:  Appears chronically ill. No resp difficulty HEENT: normal Neck: supple. JVP to jaw.  Carotids 2+ bilat; no bruits. No lymphadenopathy or thryomegaly appreciated. Cor: PMI nondisplaced. Regular rate & rhythm. No rubs, gallops. 2/6 SEM LUSB. Lungs: Rhonchi throughout Abdomen: soft, nontender, nondistended. No hepatosplenomegaly. No bruits or masses. Good bowel sounds. Extremities: no cyanosis, clubbing, rash, edema LAKA RLE 1+ edema.  Neuro: alert & orientedx3, cranial nerves grossly intact. moves all 4 extremities w/o difficulty. Affect pleasant  Telemetry   NSR 80s   EKG    No new tracings.   Labs    CBC Recent Labs    09/23/17 0745  09/24/17 0259 09/25/17 0246  WBC 6.9  --  6.0 3.9*  NEUTROABS 5.7  --   --   --   HGB 7.3*   < > 7.0* 6.6*  HCT 25.7*   < > 23.2*  21.4*  MCV 92.8  --  88.5 87.3  PLT PLATELET CLUMPS NOTED ON SMEAR, UNABLE TO ESTIMATE  --  PLATELET CLUMPS NOTED ON SMEAR, UNABLE TO ESTIMATE PLATELET CLUMPS NOTED ON SMEAR, UNABLE TO ESTIMATE   < > = values in this interval not displayed.   Basic Metabolic Panel Recent Labs    09/24/17 0259 09/25/17 0246  NA 145 143  K 3.7 3.2*  CL 111 110  CO2 22 25  GLUCOSE 158* 146*  BUN 40* 34*  CREATININE 2.05* 1.70*  CALCIUM 8.2* 8.1*   Liver Function Tests Recent Labs    09/23/17 0745  AST 57*  ALT 23  ALKPHOS 145*  BILITOT 1.4*  PROT 5.6*  ALBUMIN 2.6*   No results for input(s): LIPASE, AMYLASE in the last 72 hours. Cardiac Enzymes Recent Labs    09/23/17 1847 09/24/17 1005 09/24/17 1603  TROPONINI 3.98* 4.00* 2.93*    BNP: BNP (last 3 results) Recent Labs    08/13/17 1050 08/25/17 1001 09/23/17 0745  BNP 334.1* 526.9* 1,815.8*    ProBNP (last 3 results) No results for input(s): PROBNP in the last 8760 hours.   D-Dimer No results for input(s): DDIMER in the last 72 hours. Hemoglobin A1C No results for input(s): HGBA1C in the last 72 hours. Fasting Lipid Panel No results for input(s):  CHOL, HDL, LDLCALC, TRIG, CHOLHDL, LDLDIRECT in the last 72 hours. Thyroid Function Tests No results for input(s): TSH, T4TOTAL, T3FREE, THYROIDAB in the last 72 hours.  Invalid input(s): FREET3  Other results:   Imaging    No results found.   Medications:     Scheduled Medications: . sodium chloride   Intravenous Once  . aspirin  81 mg Oral Daily  . atorvastatin  40 mg Oral q1800  . [START ON 09/28/2017] cloNIDine  0.2 mg Transdermal Weekly  . doxazosin  8 mg Oral QPM  . enoxaparin (LOVENOX) injection  40 mg Subcutaneous Q24H  . feeding supplement (ENSURE ENLIVE)  237 mL Oral BID BM  . ferrous sulfate  325 mg Oral Q1200  . furosemide  80 mg Intravenous BID  . hydrALAZINE  100 mg Oral Q8H  . insulin aspart  0-9 Units Subcutaneous TID WC  .  ipratropium-albuterol  3 mL Nebulization TID  . isosorbide mononitrate  60 mg Oral Daily  . levothyroxine  100 mcg Oral QAC breakfast  . pantoprazole  40 mg Oral Daily    Infusions: . albuterol Stopped (09/23/17 1414)    PRN Medications: acetaminophen **OR** acetaminophen, hydrALAZINE, nitroGLYCERIN, polyethylene glycol    Patient Profile   Leonard Sedeno Poseyis a 67 y.o.malewith history of CAD s/p CABG, PAD, chronic diastolic CHF, CKD stage IV, COPD on home oxygen, and HTN.   Admitted from SNF with hypoxic respiratory distress.   Assessment/Plan   1. Acute respiratory failure with hypoxia - Now on baseline O2. O2 sats stable.. - Lactic acid 3.93 > 1.5.  - Blood cultures positive for coag negative staph (probable contaminate per ID pharmacist). Not on any antibiotics. WBC 7>3.9  - CXR 8/21 with bilateral interstitial edema. -Add incentive spirometer.   2. AMS - Head CT negative - Ammonia normal, UA negative, UDS negative - Resolved.   3. Positive troponin with hx of CAD and CABGx3 2015.  - Last LHC prior to CABG. Suspect demand ischemia.   - Denies chest pain.  - Troponin 2.11 (POC) > 3.77 > 3.98>2.9  - EKG looks unchanged from previous.   - Heparin stopped yesterday.  -Would like to avoid LHC if possible with CKD. Will need Myoview once he improves.  - Continue ASA and statin for now. Hold off on BB for now.    4. Acute on chronic diastolic CHF: [id number]: EF 65-70%, grade 2 DD, mild TR, PA peak pressure 46 mmHg ECHO repeated. EF 50-55% Left Atrium dilated. RV normal.  I/O not accurate. Weights all over the place. Needs to be weighed the same way daily.  - Volume status elevated. Increase lasix to 80 mg three times a day.  - Add 40 meq potassium twice daily.  - Not on spiro with CKD. -Add ted hose.  - 5. HTN: -- On hydralazine, imdur, cardura, and clonidine at home. -Hopefull will improve with diuresis and will iIncrease imdur to 120 mg daily.  -May  need to increase cardura tomorrow.    6. COPD: Gold stage III -Severe obstruction on prior PFTs. He is back home 2 L [place] this morning.  - Follows with Dr. Melvyn Novas outpatient.   7. CKD: Stage V.- has L AVF. Follows withDr [facility]. Baseline 1.6-2.2 - Creatinine coming down from 2.05>1.7 Family requests that nephrology be notified of his admission. Defer to primary team.   8.Hx ofDVT - Diagnosed 08/30/16   9. Hx of pleural effusions R>L  10. Extensive PAD - s/p L AKA for  extensive LLE wounds with no re-vascularization options in 03/2017 by Dr. Donzetta Matters. No change.    11. Anemia - Hemoglobin down to 6.6 . Receiving blood this morning.  Received IV iron last admission. No obvious source of bleeding. Per primary.  12Pulmonary HTN - PA peak pressure 63 mmHg on Echo 07/20/17 - Suspect primarily WHO Group III and WHO Group II pulmonary HTN. Suspect he has mild/modPulm Hypertensionat baselineand pressures wereelevated in the setting of volume overload.  13. Deconditioning - Living at SNF. Limited by Left AKA.   14. Hypokalemia  supp K.   Medication concerns reviewed with patient and pharmacy team. Barriers identified: none at this time  Length of Stay: 2  Darrick Grinder, NP  09/25/2017, 6:42 AM  Advanced Heart Failure Team Pager [phone] (M-F; 7a - 4p)  Please contact [facility] for night-coverage after hours (4p -7a ) and weekends on [email or web address]  Patient seen and examined with Darrick Grinder, NP. We discussed all aspects of the encounter. I agree with the assessment and plan as stated above.   Improving slowly but still with multiple issues. Volume status still mildly elevated. BP high. No CP.  Creatinine improving. Hgb low.   Had Myoview today for + troponin. EF 45% no ischemia (Personally reviewed). Would continue IV diuresis one more day. Then can switch back to po. Add coreg in setting of CAD and HTN. Can add amlodipine as needed.   We will see again Monday. Please  call over the weekend with any questions.   Glori Bickers, MD  8:25 PM

## 2017-09-25 NOTE — Progress Notes (Signed)
SLP Cancellation Note  Patient Details Name: Leonard [name] MRN: [id number] DOB: 17-Mar-1950   Cancelled treatment:       Reason Eval/Treat Not Completed: Patient at procedure or test/unavailable   [name] 09/25/2017, 1:04 PM  [name], M.A. CCC-SLP [phone]

## 2017-09-25 NOTE — Care Management Important Message (Signed)
Important Message  Patient Details  Name: Leonard [name] MRN: [id number] Date of Birth: 03/19/1950 Precaution in place did not go into the room.   Medicare Important Message Given:  No    Praneel Haisley 09/25/2017, 3:01 PM

## 2017-09-25 NOTE — Progress Notes (Signed)
CRITICAL VALUE ALERT  Critical Value:  Hemoglobin 6.6  Date & Time Notied:  09/25/17   Provider Notified: FMTS

## 2017-09-25 NOTE — Progress Notes (Addendum)
Family Medicine Teaching Service Daily Progress Note Intern Pager: [phone]  [name] Medical record number: [id number] Date of birth: 1950-03-30 Age: 67 y.o. Gender: male  Primary Care Provider: Kathrene Alu, MD  Code Status: full Consultations: heart failure Admission date: 8/21  Pt Overview and Major Events to Date:  8/21 admitted for acute respiratory failure 2/2 CHF exacerbation NSTEMI 8/22 off BiPAP  Subjective: Overnight: hgb dropped to 6.6- transfused 1 unit pRBC  Today: Patient is still receiving transfusion this morning. He has no complaints. His O2 was at 5L when I went to see him. I turned it down to 2L before my exam and watched his saturation which stayed above 94%. He did not complain of any dyspnea. His heparin drip has been d/ced  Objective: Temp:  [98.4 F (36.9 C)-99.3 F (37.4 C)] 98.4 F (36.9 C) (08/23 0500) Pulse Rate:  [87-107] 93 (08/23 0500) Resp:  [18-28] 25 (08/23 0500) BP: (151-172)/(65-72) 153/65 (08/23 0500) SpO2:  [93 %-100 %] 99 % (08/23 0600) Weight:  [80.6 kg] 80.6 kg (08/23 0500)  Physical Exam: General: resting comfortably, NAD Cardiovascular: fast rate, abnormal rhythm- possible S3 gallop, sternal surgical scar. He had some dried blood on the right side of his neck and he has no recollection why it's there Respiratory: clear to auscultation bilateral with occasional mild rhonchi on right upper lobe Abdomen: soft, nontender to palpation Extremities: right leg well perfused, trace edema. Left leg BKA with moderate edema, good perfusion GI: patient had no stool in bed on exam today Psych: alert and oriented GU: cath in place- yellow urine in bag  Assessment and Plan: Leonard [name] is a 67 y.o. male presenting with AMS. PMH is significant for HFpEF, COPD on home oxygen, CAD s/p CABG, PAD, CKD IV, HTN, T2DM, HLD, hypothyroidism.   Acute Hypoxic Respiratory Failure 2/2 CHF exacerbation and NSTEMI s/p CABG BNP  1815 CXR showed interstitial edema. Echo on admission unchanged from previous. Last echo June 2019 with EF 65-70% and PA peak pressure 63 mmHg. lactic acid 3.93>1.5 trending down.troponins elevated to 4.0 but has trended down to 2.93 so stopped stending. Heart failure consulted. Vitals stable-slight tachycardia and hypertension.  - monitor on telemetry -d/c troponin trending  -follow BCx and Ux - HF cardiology following, appreciate recommendations - IV lasix 80 BID  - IV KCl 40 mEq x1 -heparin drip 100units/mL -continue ASA and statin, hold BB - Keep O2 sat 88-92% given COPD GOLD III and high O2 sat can depress respiratory drive -wean oxygen orders because his oxygen keeps getting turned back up - continue home  glucopyrrolate BID - duonebs q4prn -started Spiriva yesterday - daily wts, strict I/Os  AMS. Resolved. CT head negative for acute intracranial pathology. Patient is back to baseline. He does not remember the event at all. Last thing he recalls was feeling well and laying in bed. Ammonia 10. Urinalysis showed >300 protein, trace leukocytes, negative nitrites, many bacteria. UDS was negative. Urine culture showed >100,000 colonies/mL Klebsiella pneumonia, susceptibility pending, asymptomatic - continue to monitor -consulted palliative- appreciate recs  Elevated troponin in setting of CAD s/p CABG. Likely NSTEMI vs demand from respiratory distress. Trop increased to 4.00 without ST changes on EKG and has trended down to 2.93. Denies chest pain - ASA 300mg  suppository x1 - continue home ASA81 and atorvastatin - heparin drip discontinued - stop trend troponins - am EKG  HTN. At home on coreg, hydralazine, imdur, doxazosin, clonidine patch. BP 162/70 today -  continue home medications - monitor vitals  T2DM. Last a1c 6.9 on 07/05/17. 136 this morning - monitor CBGs - sSSI  CKD with anemia, unstable. Baseline Cr 1.7-2.3. Baseline Hgb 7.5. Today: Hgb 6.6, asymptomatic -continue  transfusion of 1 unit pRBC -repeat post transfusion CBC - monitor BMP, CBC - continue home ferrous sulfate  BPH - continue home doxazosin  Hypothyroidism last TSH 3.338 on 07/10/17 - continue home synthroid   FEN/GI: NPO, protonix Prophylaxis: heparin gtt   Disposition: admit to step down   Laboratory: Recent Labs  Lab 09/23/17 0745 09/23/17 0810 09/24/17 0259 09/25/17 0246  WBC 6.9  --  6.0 3.9*  HGB 7.3* 7.8* 7.0* 6.6*  HCT 25.7* 23.0* 23.2* 21.4*  PLT PLATELET CLUMPS NOTED ON SMEAR, UNABLE TO ESTIMATE  --  PLATELET CLUMPS NOTED ON SMEAR, UNABLE TO ESTIMATE PLATELET CLUMPS NOTED ON SMEAR, UNABLE TO ESTIMATE   Recent Labs  Lab 09/23/17 0745 09/23/17 0810 09/24/17 0259 09/25/17 0246  NA 146* 145 145 143  K 3.7 3.6 3.7 3.2*  CL 111 110 111 110  CO2 18*  --  22 25  BUN 33* 33* 40* 34*  CREATININE 2.31* 2.00* 2.05* 1.70*  CALCIUM 8.2*  --  8.2* 8.1*  PROT 5.6*  --   --   --   BILITOT 1.4*  --   --   --   ALKPHOS 145*  --   --   --   ALT 23  --   --   --   AST 57*  --   --   --   GLUCOSE 227* 220* 158* 146*    Imaging/Diagnostic Tests: Head CT- no acute intracranial pathology  Richarda Osmond, DO 09/25/2017, 6:41 AM PGY-1, [place] Intern pager: [phone], text pages welcome

## 2017-09-25 NOTE — Consult Note (Signed)
[facility] Nurse wound consult note Reason for Consult: Consult requested for sacrum Wound type: Pt has patchy area of moisture associated skin damage scattered across bilat upper buttocks and sacrum. There is a partial thickness fissure in the gluteal fold; appearance and location is consistent with moisture, NOT a pressure injury. Pt is frequently incontinent of loose stools. Measurement:  .5X.1X.1cm, pink and moist, surrounded for 3X3cm area of macerated skin; no odor or draiange Dressing procedure/placement/frequency: Barrier cream to protect and promote healing.  Discussed plan of care with patient and family members at the bedside. Please re-consult if further assistance is needed.  Thank-you,  Julien Girt MSN, [place], [place], [place], [place]

## 2017-09-25 NOTE — Progress Notes (Signed)
Patient states he does not want to wear his BIPAP tonight. Patient is on 3L[place] with 02 saturations at 98%. BIPAP is on standby at bedside if needed. RT will continue to monitor.

## 2017-09-25 NOTE — Progress Notes (Signed)
   Stress portion of NST completed. Images pending. Final interpretation pending. [facility] to read.   Lyda Jester, PA-C 09/25/2017

## 2017-09-26 DIAGNOSIS — R0602 Shortness of breath: Secondary | ICD-10-CM

## 2017-09-26 DIAGNOSIS — Z515 Encounter for palliative care: Secondary | ICD-10-CM

## 2017-09-26 DIAGNOSIS — Z7189 Other specified counseling: Secondary | ICD-10-CM

## 2017-09-26 LAB — BASIC METABOLIC PANEL
Anion gap: 8 (ref 5–15)
BUN: 30 mg/dL — AB (ref 8–23)
CHLORIDE: 107 mmol/L (ref 98–111)
CO2: 27 mmol/L (ref 22–32)
CREATININE: 1.64 mg/dL — AB (ref 0.61–1.24)
Calcium: 8.3 mg/dL — ABNORMAL LOW (ref 8.9–10.3)
GFR calc Af Amer: 49 mL/min — ABNORMAL LOW (ref >60)
GFR calc non Af Amer: 42 mL/min — ABNORMAL LOW (ref >60)
GLUCOSE: 143 mg/dL — AB (ref 70–99)
Potassium: 3.6 mmol/L (ref 3.5–5.1)
Sodium: 142 mmol/L (ref 135–145)

## 2017-09-26 LAB — CBC
HEMATOCRIT: 23.8 % — AB (ref 39.0–52.0)
HEMOGLOBIN: 7.4 g/dL — AB (ref 13.0–17.0)
MCH: 27.2 pg (ref 26.0–34.0)
MCHC: 31.1 g/dL (ref 30.0–36.0)
MCV: 87.5 fL (ref 78.0–100.0)
Platelets: UNDETERMINED 10*3/uL (ref 150–400)
RBC: 2.72 MIL/uL — ABNORMAL LOW (ref 4.22–5.81)
RDW: 15.6 % — ABNORMAL HIGH (ref 11.5–15.5)
WBC: 3.9 10*3/uL — ABNORMAL LOW (ref 4.0–10.5)

## 2017-09-26 LAB — GLUCOSE, CAPILLARY
GLUCOSE-CAPILLARY: 157 mg/dL — AB (ref 70–99)
Glucose-Capillary: 111 mg/dL — ABNORMAL HIGH (ref 70–99)
Glucose-Capillary: 164 mg/dL — ABNORMAL HIGH (ref 70–99)
Glucose-Capillary: 177 mg/dL — ABNORMAL HIGH (ref 70–99)

## 2017-09-26 LAB — HEMOGLOBIN AND HEMATOCRIT, BLOOD
HEMATOCRIT: 26.9 % — AB (ref 39.0–52.0)
HEMOGLOBIN: 8.5 g/dL — AB (ref 13.0–17.0)

## 2017-09-26 LAB — CULTURE, BLOOD (ROUTINE X 2): SPECIAL REQUESTS: ADEQUATE

## 2017-09-26 LAB — PREPARE RBC (CROSSMATCH)

## 2017-09-26 MED ORDER — SODIUM CHLORIDE 0.9% IV SOLUTION
Freq: Once | INTRAVENOUS | Status: AC
  Administered 2017-09-26: 13:00:00 via INTRAVENOUS

## 2017-09-26 MED ORDER — ENOXAPARIN SODIUM 40 MG/0.4ML ~~LOC~~ SOLN
40.0000 mg | SUBCUTANEOUS | Status: DC
  Administered 2017-09-26 – 2017-09-30 (×5): 40 mg via SUBCUTANEOUS
  Filled 2017-09-26 (×5): qty 0.4

## 2017-09-26 MED ORDER — IPRATROPIUM-ALBUTEROL 0.5-2.5 (3) MG/3ML IN SOLN
3.0000 mL | Freq: Three times a day (TID) | RESPIRATORY_TRACT | Status: DC
  Administered 2017-09-26 – 2017-10-01 (×16): 3 mL via RESPIRATORY_TRACT
  Filled 2017-09-26 (×15): qty 3

## 2017-09-26 NOTE — Progress Notes (Signed)
Family Medicine Teaching Service Daily Progress Note Intern Pager: [phone]  [name] Medical record number: [id number] Date of birth: 1950/04/19 Age: 67 y.o. Gender: male  Primary Care Provider: Kathrene Alu, MD  Code Status: FULL  Consultations: HF Admission date: 8/21  Pt Overview and Major Events to Date:  8/21 admitted for acute respiratory failure 2/2 CHF exacerbation NSTEMI 8/22 off BiPAP 8/22 off heparin gtt  Assessment and Plan: Leonard [name] is a 66 y.o. male presenting with AMS. PMH is significant for HFpEF, COPD on home oxygen, CAD s/p CABG, PAD, CKD IV, HTN, T2DM, HLD, hypothyroidism.    Acute Hypoxic Respiratory Failure 2/2 CHF exacerbation and NSTEMI s/p CABG Improving, s/p heparin gtt (off 8/22).  Echo on admission minimally changed with EF 50-55% from previous in June 2019 with EF 65-70%. Lactic acidosis resolved.    Wt ~17lbs since admission with diuresis, currently with IV Lasix 80 mg TID.   Heart failure following, appreciate recs.  This morning he is alert with comfortable work of breathing on 3L per [place] and satting 93%.  Home O2 requirement is 2L. Exam notable for mild rhonchi, RLE with some edema.   S/p stress test 8/23- results pending.   -monitor on tele   - f/u HF recs  - IV lasix 80 TID   -continue ASA, statin, coreg  -on 3L [place], stable and satting  -follow BCx - 1/2 MRSE likely contaminant  - continue home  glucopyrrolate BID - duonebs q4prn -started Spiriva yesterday - daily wts, strict I/Os  Asymptomatic bacteriuria  Patient denies urinary symptoms.   Ucx growing >100,000 colonies of Klebsiella pneumonia, confirmed ESBL.  Likely colonization, will hold off on treatment at this time unless symptomatic.   AMS. Resolved. CT head negative for acute intracranial pathology. Patient is back to baseline.  - continue to monitor -consulted palliative- appreciate recs  Elevated troponin in setting of CAD s/p CABG. NSTEMI.  Trop  increased to 4.00 without ST changes on EKG and has trended down to 2.93. Denies chest pain at this time.  - continue ASA, statin, BB - continue to monitor   HTN. At home on coreg, hydralazine, imdur, doxazosin, clonidine patch. BP 177/76 today - continue home medications - monitor vitals  T2DM. Last A1c 6.9 on 07/05/17. 111 this morning.   - monitor CBGs - sSSI  CKD with anemia, unstable.  Baseline Cr 1.7-2.3. Baseline Hgb 7.5. S/p 1u prbc transfusion 8/23.  Transfusion threshold is 8.0.   Today: Hgb 7.4, will proceed with additional unit of blood.  - f/u post trans [place] - continue home ferrous sulfate  BPH - continue home doxazosin  Hypothyroidism last TSH 3.338 on 07/10/17 - continue home synthroid   FEN/GI: DYS 2, protonix Prophylaxis: ppx held as he is being transfused    Disposition: continue to monitor in stepdown   Subjective: Pt in good spirits, did not want to wear bipap overnight.  Is on 3L O2 per [place] satting  93%.  Denies SOB, CP, palpitations.  No acute concerns.   Objective: Temp:  [98.1 F (36.7 C)-98.8 F (37.1 C)] 98.7 F (37.1 C) (08/24 0737) Pulse Rate:  [78-92] 84 (08/24 0737) Resp:  [16-22] 16 (08/24 0737) BP: (136-177)/(59-78) 177/76 (08/24 0737) SpO2:  [90 %-99 %] 93 % (08/24 0805) Weight:  [80.5 kg] 80.5 kg (08/24 0500)  Physical Exam: General: pleasant 67 yo male, resting comfortably, NAD  Cardiovascular: RRR no MRG, sternal surgical scar  Respiratory: CTAB Abdomen:  soft, NTND, +bs  Extremities: right leg well perfused, trace edema, left leg BKA, RLE with 1+ pitting edema, good perfusion Psych: alert and oriented   Laboratory: Recent Labs  Lab 09/25/17 0246 09/25/17 1454 09/26/17 0316  WBC 3.9* 4.0 3.9*  HGB 6.6* 7.0* 7.4*  HCT 21.4* 22.5* 23.8*  PLT PLATELET CLUMPS NOTED ON SMEAR, UNABLE TO ESTIMATE PLATELET CLUMPS NOTED ON SMEAR, UNABLE TO ESTIMATE PLATELET CLUMPS NOTED ON SMEAR, UNABLE TO ESTIMATE   Recent Labs  Lab  09/23/17 0745  09/24/17 0259 09/25/17 0246 09/26/17 0316  NA 146*   < > 145 143 142  K 3.7   < > 3.7 3.2* 3.6  CL 111   < > 111 110 107  CO2 18*  --  22 25 27   BUN 33*   < > 40* 34* 30*  CREATININE 2.31*   < > 2.05* 1.70* 1.64*  CALCIUM 8.2*  --  8.2* 8.1* 8.3*  PROT 5.6*  --   --   --   --   BILITOT 1.4*  --   --   --   --   ALKPHOS 145*  --   --   --   --   ALT 23  --   --   --   --   AST 57*  --   --   --   --   GLUCOSE 227*   < > 158* 146* 143*   < > = values in this interval not displayed.    Imaging/Diagnostic Tests: Head CT- no acute intracranial pathology  Lovenia Kim, MD 09/26/2017, 9:05 AM PGY-3, [facility] Intern pager: [phone], text pages welcome

## 2017-09-26 NOTE — Consult Note (Signed)
Consultation Note Date: 09/26/2017   [name]  DOB: 09/04/1950  MRN: [id number]  Age / Sex: 67 y.o., male  PCP: Leonard Alu, MD Referring Physician: Martyn Malay, MD  Reason for Consultation: Establishing goals of care and Psychosocial/spiritual support  HPI/Patient Profile: 67 y.o. 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 Pt was found to have CHF exacerbation. BNP 1,815; troponin 2.22. Pt placed on BIPAP and mental status improved. Felt to have had NSTEMI ( troponin peaked 2.93). Pt has had multiple admissions 7/23, 7/11, 6/2, 2/7, 2/1.   Consult ordered for [name] and code status discussion  Clinical Assessment and Goals of Care: Pt seen, chart reviewed. He is a/0 x 3 but verbalizing SHOB. 02 sats 88% on 3 L. He refused BIPAP last night and states he does not have BIPAP or CPAP at home ( no c02 retention on initial ABG ).   Pt at this point Mr [facility] [name] can speak for himself. His health care proxy in the event he were unable to do so is his sister Leonard [name] [phone].  Mr. [name] is unmarried with no children.  He has multiple siblings here in town and describes them as very supportive.  It is not clear to Mr. Leonard [name] whether [facility] [name] has been designated legally to be his healthcare power of attorney but he does describe it as being understood within the family dynamics  Patient shares he is been living in [name] for about 4 to 5 months.  He would like to undergo rehab with the goal of getting back home which is at home he shares with his sister [name] in [place].  He states he needs help to pivot to wheelchair and has not been fitted with a prosthesis since his amputation in February  When asked about  advanced care planning and specifically CODE STATUS patient was unaware of the terms full code and DNR.  I defined these terms and patient replied "I want to live".  I described what a full code was in terms of CPR ,defibrillation, and more than likely need for life support/ventilator support.  I also described what DNR stood for specifically that it does not stand for do not treat, but sets care limits.  He verbalized that he wished to continue to be in a full code at this point and was unable to articulate a place where perhaps that might change.    SUMMARY OF RECOMMENDATIONS    Full code by choice Would pt benefit from sleep study in order to obtain BIPAP/CPAP to have as support and improve cardiac/respiratory status at facility?  He is willing to undergo a sleep study Patient would clearly benefit from further goals of care as well as disease education.  He appears to have limited understanding of the complexity of his health conditions, multisystem  involvement.  He is stating that he would go forward with hemodialysis when presented with that Community-based palliative care can be accessed through either care connection at [phone] or hospice and palliative care of [facility]'s palliative medicine division at [phone] Code Status/Advance Care Planning:  Full code    Symptom Management:   Dyspnea: Cont targeted pulmonary treatments: nebs, 02, intermittent BIPAP, nebs; maximize CHF medical management  Palliative Prophylaxis:   Aspiration, Bowel Regimen, Delirium Protocol, Eye Care, Frequent Pain Assessment, Oral Care and Turn Reposition  Additional Recommendations (Limitations, Scope, Preferences):  Full Scope Treatment  Psycho-social/Spiritual:   Desire for further Chaplaincy support:no  Additional Recommendations: Referral to Community Resources   Prognosis:   Unable to determine  Discharge Planning: [facility] for rehab with Palliative care service  follow-up      Primary Diagnoses: Present on Admission: . Respiratory failure with hypoxia ([place])   I have reviewed the medical record, interviewed the patient and family, and examined the patient. The following aspects are pertinent.  Past Medical History:  Diagnosis Date  . Acute on chronic diastolic heart failure ([place] [name]) 07/04/2014  . Anemia of renal disease   . Atherosclerotic peripheral vascular disease with gangrene ([name]) 02/14/2017  . CAD (coronary artery disease) 04/06/2013  . Cancer ([name])    skin cancer-  . CHF (congestive heart failure) ([name])   . Chronic anemia 02/28/2016  . Chronic deep vein thrombosis (DVT) of femoral vein of left lower extremity ([name]) 09/03/2016  . Chronic respiratory failure with hypoxia ([name]) 08/25/2017  . CKD (chronic kidney disease) stage 4, GFR 15-29 ml/min ([name]) 01/15/2015   Follows with [name].  Last visits:  07/23/16: No change in meds. Worried that if we increase diuretics could lead to another AKI  . CKD (chronic kidney disease), stage IV ([name])    07/07/2017  . Claudication ([name])   . Collapse of left talus 01/14/2017  . COPD GOLD III  02/16/2014   Quit smoking 2014  - PFT's  04/06/13   FEV1 1.28 (44 % ) ratio 48  p no % improvement from saba p ?  prior to study with DLCO  43/54 % corrects to 97  % for alv volume - 01/31/2016  After extensive coaching HFA effectiveness =    75% from baseline 0 > changed to sprivia respimat   - PFT's  03/17/2016  FEV1 2.25  (81 % ) ratio 88  p 2 % improvement from saba p spiriva respimat x 2  prior to study with  . Coronary artery disease   . Diabetes ([name])   . Diabetic polyneuropathy associated with type 2 diabetes mellitus ([place]) 01/14/2017  . Diabetic retinopathy ([name]) 11/13/2015   Per Dr. Erenest Rasher eye exam in 10/17/15, mild background diabetic retinopathy  . Fatigue associated with anemia 01/21/2016  . Gangrene of toe ([name])    right 5th/notes 01/04/2013   . HCAP (healthcare-associated pneumonia)   . Hematuria  06/27/2015  . High cholesterol   . History of tobacco use 08/23/2013   Smoked pack per day for 46 years Quit smoking - 02/03/2013   . Hypertension   . Hypothyroidism 11/22/2014  . Idiopathic chronic gout of left foot without tophus   . Iron deficiency anemia 08/03/2015  . Left foot pain 10/29/2016  . Long term (current) use of anticoagulants [Z79.01] 09/09/2016  . 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 Mixed hyperlipidemia 07/17/2008  . Morbid obesity due to excess calories ([place]) 03/17/2016  . PAD (  peripheral artery disease) ([place])   . Pleural effusion   . Pneumonitis 07/26/2017  . Pulmonary artery hypertension ([place]) 07/25/2017  . PVD - hx of Rt SFA PTA and s/p Rt 4-5th toe amp Dec 2014 04/05/2013  . 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 Respiratory failure ([place]) 08/13/2017  . 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 Secondary hypertension   . Shortness of breath dyspnea    with exertion  . Skin lesion of face 02/22/2015  . SOB (shortness of breath)   . Type 2 diabetes mellitus with pressure callus ([place]) 12/29/2016  . Type II diabetes mellitus ([name])    Type II  . Type II diabetes mellitus with complication [facility])    Social History   Socioeconomic History  . Marital status: Single    Spouse name: Not on file  . Number of children: Not on file  . Years of education: Not on file  . Highest education level: Not on file  Occupational History  . Not on file  Social Needs  . Financial resource strain: Not on file  . Food insecurity:    Worry: Not on file    Inability: Not on file  . Transportation needs:    Medical: Not on file    Non-medical: Not on file  Tobacco Use  . Smoking status: Former Smoker    Packs/day: 1.00    Years: 46.00    Pack years: 46.00    Types: Cigarettes    Start date: 02/04/1967    Last attempt to quit: 02/03/2013    Years since quitting: 4.6  . Smokeless tobacco: Never Used  Substance and Sexual Activity  . Alcohol use: No    Alcohol/week: 0.0  standard drinks  . Drug use: No  . Sexual activity: Not Currently  Lifestyle  . Physical activity:    Days per week: Not on file    Minutes per session: Not on file  . Stress: Not on file  Relationships  . Social connections:    Talks on phone: Not on file    Gets together: Not on file    Attends religious service: Not on file    Active member of club or organization: Not on file    Attends meetings of clubs or organizations: Not on file    Relationship status: Not on file  Other Topics Concern  . Not on file  Social History Narrative   Lives with sister, [name] in [place].  Retired - former Sports coach.   Patient in skilled nursing facility   Family History  Problem Relation Age of Onset  . Cancer Mother        lung cancer  . Hypertension Mother   . Cancer Sister        patient thinks it was uterine cancer  . Hypertension Sister   . Heart attack Sister   . Stroke Neg Hx    Scheduled Meds: . sodium chloride   Intravenous Once  . aspirin  81 mg Oral Daily  . atorvastatin  40 mg Oral q1800  . carvedilol  6.25 mg Oral BID WC  . [START ON 09/28/2017] cloNIDine  0.2 mg Transdermal Weekly  . doxazosin  8 mg Oral QPM  . feeding supplement (ENSURE ENLIVE)  237 mL Oral BID BM  . ferrous sulfate  325 mg Oral Q1200  . furosemide  80 mg Intravenous TID  . hydrALAZINE  100 mg Oral Q8H  . insulin aspart  0-9 Units Subcutaneous TID WC  .  ipratropium-albuterol  3 mL Nebulization TID  . isosorbide mononitrate  120 mg Oral Daily  . levothyroxine  100 mcg Oral QAC breakfast  . pantoprazole  40 mg Oral BID  . potassium chloride  40 mEq Oral BID   Continuous Infusions: . albuterol Stopped (09/23/17 1414)   PRN Meds:.acetaminophen **OR** acetaminophen, hydrALAZINE, nitroGLYCERIN, polyethylene glycol Medications Prior to Admission:  Prior to Admission medications   Medication Sig Start Date End Date Taking? Authorizing Provider  acetaminophen (TYLENOL) 325 MG tablet Take  650 mg by mouth every 8 (eight) hours as needed for mild pain or fever.    Yes [provider]  aspirin EC 81 MG tablet Take 1 tablet (81 mg total) by mouth daily. 08/07/17 08/07/18 Yes Riccio, Angela C, DO  atorvastatin (LIPITOR) 40 MG tablet TAKE ONE (1) TABLET BY MOUTH EVERY DAY AT 6:00PM Patient taking differently: Take 40 mg by mouth daily at 6 PM.  11/17/16  Yes Larey Dresser, MD  carvedilol (COREG) 12.5 MG tablet Take 1 tablet (12.5 mg total) by mouth 2 (two) times daily with a meal. 08/30/17  Yes Meccariello, Bernita Raisin, DO  cloNIDine (CATAPRES - DOSED IN MG/24 HR) 0.2 mg/24hr patch Place 1 patch (0.2 mg total) onto the skin once a week. Patient taking differently: Place 0.2 mg onto the skin every Monday.  08/09/17  Yes Lucila Maine C, DO  doxazosin (CARDURA) 8 MG tablet Take 1 tablet (8 mg total) by mouth every evening. 12/01/16  Yes Hensel, Jamal Collin, MD  feeding supplement, ENSURE ENLIVE, (ENSURE ENLIVE) LIQD Take 237 mLs by mouth 3 (three) times daily between meals. 08/07/17  Yes Riccio, Angela C, DO  ferrous sulfate (FEROSUL) 325 (65 FE) MG tablet Take 1 tablet (325 mg total) by mouth daily at 12 noon. 08/31/17  Yes Meccariello, Bernita Raisin, DO  hydrALAZINE (APRESOLINE) 100 MG tablet TAKE ONE TABLET BY MOUTH EVERY EIGHT HOURS Patient taking differently: Take 100 mg by mouth every 8 (eight) hours.  11/14/16  Yes [name], MD  insulin aspart (NOVOLOG FLEXPEN) 100 UNIT/ML FlexPen Inject 15 Units 2 (two) times daily before lunch and supper into the skin. Patient taking differently: Inject 0-15 Units into the skin See admin instructions. Inject as per sliding scale: 0-150= 0 units 151-400=15 units Call PCP if CBG<70 or >401 12/15/16  Yes Hensel, Jamal Collin, MD  insulin glargine (LANTUS) 100 UNIT/ML injection Inject 24 Units into the skin at bedtime.   Yes [provider]  ipratropium-albuterol (DUONEB) 0.5-2.5 (3) MG/3ML SOLN Take 3 mLs by nebulization 4 (four) times  daily as needed (shortness of breath and wheezing).    Yes [provider]  isosorbide mononitrate (IMDUR) 60 MG 24 hr tablet TAKE ONE (1) TABLET BY MOUTH EVERY DAY Patient taking differently: Take 60 mg by mouth daily.  01/14/17  Yes Bensimhon, Shaune Pascal, MD  levothyroxine (SYNTHROID, LEVOTHROID) 100 MCG tablet Take 100 mcg by mouth daily before breakfast.    Yes [provider]  LONHALA MAGNAIR STARTER KIT 25 MCG/ML SOLN Inhale 1 mL into the lungs 2 (two) times daily. 09/21/17  Yes [provider]  nitroGLYCERIN (NITROSTAT) 0.4 MG SL tablet Place 1 tablet (0.4 mg total) under the tongue every 5 (five) minutes as needed for chest pain. 11/14/16  Yes [name], MD  pantoprazole (PROTONIX) 40 MG tablet Take 40 mg by mouth daily.   Yes [provider]  polyethylene glycol (MIRALAX / GLYCOLAX) packet Take 17 g by mouth  daily as needed for mild constipation or moderate constipation. Patient taking differently: Take 17 g by mouth every 8 (eight) hours as needed for mild constipation.  08/07/17  Yes Riccio, Angela C, DO  torsemide (DEMADEX) 20 MG tablet Take 60 mg by mouth daily.   Yes [provider]  Blood Glucose Monitoring Suppl ([place]) w/Device KIT 1 kit by Does not apply route daily. 10/10/16   McDiarmid, Blane Ohara, MD  Darbepoetin Alfa (ARANESP) 40 MCG/0.4ML SOSY injection Inject 0.4 mLs (40 mcg total) into the skin every 30 (thirty) days. 09/28/17   Meccariello, Bernita Raisin, DO  glucose blood (ONETOUCH VERIO) test strip Use as instructed to test three times daily. ICD-10 code: E11.22 10/15/16   McDiarmid, Blane Ohara, MD  [facility] DELICA LANCETS 55D MISC 1 each by Does not apply route 3 (three) times daily. ICD-10 code: E11.22 10/15/16   McDiarmid, Blane Ohara, MD   Allergies  Allergen Reactions  . Penicillins Other (See Comments)    Has patient had a PCN reaction causing immediate rash, facial/tongue/throat swelling, SOB or lightheadedness with  hypotension: Unknown Has patient had a PCN reaction causing severe rash involving mucus membranes or skin necrosis: Unknown Has patient had a PCN reaction that required hospitalization: Unknown Has patient had a PCN reaction occurring within the last 10 years: Unknown If all of the above answers are "NO", then may proceed with Cephalosporin use.    Review of Systems  Unable to perform ROS: Other    Physical Exam  Constitutional: He is oriented to person, place, and time.  Ill appearing older man; a/o x3 , increased work of breathing at rest  HENT:  Head: Normocephalic and atraumatic.  Neck: Normal range of motion.  Pulmonary/Chest:  Increased work of breathing at rest 02 sats 88% on 3L. Refused BIPAP last night  Abdominal: Soft.  Musculoskeletal: Normal range of motion.  weak  Neurological: He is alert and oriented to person, place, and time.  Skin: Skin is warm and dry.  Psychiatric: He has a normal mood and affect. His behavior is normal. Judgment and thought content normal.  Nursing note and vitals reviewed.   Vital Signs: BP (!) 177/76 (BP Location: Right Arm)   Pulse 84   Temp 98.7 F (37.1 C) (Oral)   Resp 16   Ht _0  (1.727 m)   Wt 80.5 kg   SpO2 93%   BMI 26.98 kg/m  Pain Scale: 0-10 POSS *See Group Information*: 1-Acceptable,Awake and alert Pain Score: 0-No pain   SpO2: SpO2: 93 % O2 Device:SpO2: 93 % O2 Flow Rate: .O2 Flow Rate (L/min): 3 L/min  IO: Intake/output summary:   Intake/Output Summary (Last 24 hours) at 09/26/2017 0902 Last data filed at 09/25/2017 2000 Gross per 24 hour  Intake 855 ml  Output 1800 ml  Net -945 ml    LBM: Last BM Date: 09/25/17 Baseline Weight: Weight: 73 kg Most recent weight: Weight: 80.5 kg     Palliative Assessment/Data:   Flowsheet Rows     Most Recent Value  Intake Tab  Referral Department  Hospitalist  Unit at Time of Referral  Med/Surg Unit  Palliative Care Primary Diagnosis  Pulmonary  Date Notified   09/24/17  Palliative Care Type  [facility]  Reason for referral  Clarify Goals of Care, Psychosocial or Spiritual support, Advance Care Planning  Date of Admission  09/23/17  Date first seen by Palliative Care  09/26/17  # of days Palliative referral response  time  2 Day(s)  # of days IP prior to Palliative referral  1  Clinical Assessment  Palliative Performance Scale Score  40%  Pain Max last 24 hours  Not able to report  Pain Min Last 24 hours  Not able to report  Dyspnea Max Last 24 Hours  Not able to report  Dyspnea Min Last 24 hours  Not able to report  Nausea Max Last 24 Hours  Not able to report  Nausea Min Last 24 Hours  Not able to report  Anxiety Max Last 24 Hours  Not able to report  Anxiety Min Last 24 Hours  Not able to report  Other Max Last 24 Hours  Not able to report  Psychosocial & Spiritual Assessment  Palliative Care Outcomes  Patient/Family meeting held?  Yes  Who was at the meeting?  pt  Palliative Care Outcomes  Provided psychosocial or spiritual support, Provided advance care planning      Time In: 0830 Time Out: 0940 Time Total: 70 min Greater than 50%  of this time was spent counseling and coordinating care related to the above assessment and plan.  Signed by: Dory Horn, NP   Please contact Palliative Medicine Team phone at [phone] for questions and concerns.  For individual provider: See Shea Evans

## 2017-09-27 LAB — BPAM RBC
BLOOD PRODUCT EXPIRATION DATE: 201908302359
BLOOD PRODUCT EXPIRATION DATE: 201909072359
ISSUE DATE / TIME: 201908230655
ISSUE DATE / TIME: 201908241254
UNIT TYPE AND RH: 1700
Unit Type and Rh: 1700

## 2017-09-27 LAB — CBC
HEMATOCRIT: 25.6 % — AB (ref 39.0–52.0)
Hemoglobin: 8 g/dL — ABNORMAL LOW (ref 13.0–17.0)
MCH: 27.1 pg (ref 26.0–34.0)
MCHC: 31.3 g/dL (ref 30.0–36.0)
MCV: 86.8 fL (ref 78.0–100.0)
Platelets: 104 10*3/uL — ABNORMAL LOW (ref 150–400)
RBC: 2.95 MIL/uL — AB (ref 4.22–5.81)
RDW: 15.6 % — AB (ref 11.5–15.5)
WBC: 4.6 10*3/uL (ref 4.0–10.5)

## 2017-09-27 LAB — BASIC METABOLIC PANEL
ANION GAP: 12 (ref 5–15)
BUN: 28 mg/dL — ABNORMAL HIGH (ref 8–23)
CALCIUM: 8.5 mg/dL — AB (ref 8.9–10.3)
CO2: 27 mmol/L (ref 22–32)
Chloride: 101 mmol/L (ref 98–111)
Creatinine, Ser: 1.59 mg/dL — ABNORMAL HIGH (ref 0.61–1.24)
GFR calc non Af Amer: 44 mL/min — ABNORMAL LOW (ref >60)
GFR, EST AFRICAN AMERICAN: 51 mL/min — AB (ref >60)
Glucose, Bld: 148 mg/dL — ABNORMAL HIGH (ref 70–99)
POTASSIUM: 4.2 mmol/L (ref 3.5–5.1)
Sodium: 140 mmol/L (ref 135–145)

## 2017-09-27 LAB — TYPE AND SCREEN
ABO/RH(D): B NEG
ANTIBODY SCREEN: NEGATIVE
UNIT DIVISION: 0
Unit division: 0

## 2017-09-27 LAB — GLUCOSE, CAPILLARY
GLUCOSE-CAPILLARY: 163 mg/dL — AB (ref 70–99)
GLUCOSE-CAPILLARY: 178 mg/dL — AB (ref 70–99)
GLUCOSE-CAPILLARY: 146 mg/dL — AB (ref 70–99)
Glucose-Capillary: 122 mg/dL — ABNORMAL HIGH (ref 70–99)

## 2017-09-27 MED ORDER — FUROSEMIDE 80 MG PO TABS
80.0000 mg | ORAL_TABLET | Freq: Three times a day (TID) | ORAL | Status: DC
  Administered 2017-09-27 – 2017-09-28 (×3): 80 mg via ORAL
  Filled 2017-09-27 (×3): qty 1

## 2017-09-27 NOTE — Progress Notes (Signed)
Family Medicine Teaching Service Daily Progress Note Intern Pager: [phone]  Patient name: MENDEL BINSFELD Medical record number: [id number] Date of birth: [birth year] Age: 67 y.o. Gender: male  Primary Care Provider: Kathrene Alu, MD  Code Status: FULL  Consultations: HF Admission date: 8/21   Pt Overview and Major Events to Date:  8/21 admitted for acute respiratory failure 2/2 CHF exacerbation NSTEMI 8/22 off BiPAP  8/22 off heparin gtt  Assessment and Plan: KALADIN NOSEWORTHY is a 67 y.o. male presenting with AMS. PMH is significant for HFpEF, COPD on home oxygen, CAD s/p CABG, PAD, CKD IV, HTN, T2DM, HLD, hypothyroidism.    Acute Hypoxic Respiratory Failure 2/2 CHF exacerbation and NSTEMI s/p CABG Improved, stable on 3L per [place] overnight and satting 94%. Home O2 requirement is 2L.   S/p heparin gtt (off 8/22).  Echo on admission minimally changed with EF 50-55% from previous in June 2019 with EF 65-70%.  Wt  -7 lbs since yesterday with diuresis, currently with IV Lasix 80 mg TID.  UOP 4L over the course of the last 24h.   Heart failure following, appreciate recs - will see pt again Monday.  Exam notable for mild rhonchi, RLE with some edema.   S/p stress test 8/23- results pending.   -monitor on tele   - f/u HF recs  - IV lasix 80 TID > transition to po lasix 80 mg TID today  -continue ASA, statin, coreg  -on 3L [place], wean to home 2L as tolerated -follow BCx - 1/2 MRSE likely contaminant  - continue home  glucopyrrolate BID - duonebs q4prn -started Spiriva yesterday - daily wts, strict I/Os  Asymptomatic bacteriuria  Patient denies urinary symptoms.   Ucx growing >100,000 colonies of Klebsiella pneumonia, confirmed ESBL.  Likely colonization, will hold off on treatment at this time unless symptomatic.    AMS. Resolved. CT head negative for acute intracranial pathology. Patient is back to baseline.  Seen by palliative 8/24 - pt is full code by choice, appears to have limited  understanding of the complexity of his health conditions and would benefit from further goals of care, phone number of community-based palliative care divisions are in palliative note.  - continue to monitor  Elevated troponin in setting of CAD s/p CABG. Stable, s/p NSTEMI and heparin gtt.  Trop have peaked at 4.00 without ST changes on EKG and has trended down to 2.93. No longer trending.  Denies chest pain at this time.  - continue ASA, statin, BB - continue to monitor   HTN. At home on coreg, hydralazine, imdur, doxazosin, clonidine patch. BP 178/71 today - continue home meds - monitor vitals  T2DM. Last A1c 6.9 on 07/05/17. 122 this morning.   - monitor CBGs - sSSI  CKD with anemia, unstable.  Baseline Cr 1.7-2.3, baseline Hgb 7.5. S/p 1u prbc transfusion on 8/23 and 1u 8/24.  Transfusion threshold is 8.0.  Post trans H/H on 8/24 was 8.5.  Hgb stable this morning at 8.0. - continue home ferrous sulfate  BPH - continue home doxazosin  Hypothyroidism last TSH 3.338 on 07/10/17 - continue home synthroid   FEN/GI: DYS 2, protonix   Prophylaxis: lovenox  Disposition: dispo pending clinical improvement   Subjective: Patient alert and awake this morning, no acute overnight events.  Did not wear bipap overnight, currently comfortable on 3L per [place].  No CP, SOB, leg swelling or palpitations.   Objective: Temp:  [98.3 F (36.8 C)-99.1 F (37.3 C)] 98.7 F (  37.1 C) (08/25 0750) Pulse Rate:  [77-100] 80 (08/25 0750) Resp:  [16-20] 20 (08/25 0750) BP: (144-178)/(65-82) 178/71 (08/25 0750) SpO2:  [90 %-100 %] 97 % (08/25 0750) Weight:  [77.2 kg] 77.2 kg (08/25 0435)  Physical Exam: General: 67 yo male, awake, NAD   Cardiovascular: RRR no MRG, sternal surgical scar  Respiratory: CTAB, comfortable WOB on 3L per [place]  Abdomen: soft, NTND, +bs  Extremities: right leg well perfused, RLE with trace edema,  left leg BKA  Psych: normal mood and affect   Laboratory: Recent Labs   Lab 09/25/17 1454 09/26/17 0316 09/26/17 1837 09/27/17 0323  WBC 4.0 3.9*  --  4.6  HGB 7.0* 7.4* 8.5* 8.0*  HCT 22.5* 23.8* 26.9* 25.6*  PLT PLATELET CLUMPS NOTED ON SMEAR, UNABLE TO ESTIMATE PLATELET CLUMPS NOTED ON SMEAR, UNABLE TO ESTIMATE  --  104*   Recent Labs  Lab 09/23/17 0745  09/25/17 0246 09/26/17 0316 09/27/17 0323  NA 146*   < > 143 142 140  K 3.7   < > 3.2* 3.6 4.2  CL 111   < > 110 107 101  CO2 18*   < > 25 27 27   BUN 33*   < > 34* 30* 28*  CREATININE 2.31*   < > 1.70* 1.64* 1.59*  CALCIUM 8.2*   < > 8.1* 8.3* 8.5*  PROT 5.6*  --   --   --   --   BILITOT 1.4*  --   --   --   --   ALKPHOS 145*  --   --   --   --   ALT 23  --   --   --   --   AST 57*  --   --   --   --   GLUCOSE 227*   < > 146* 143* 148*   < > = values in this interval not displayed.    Imaging/Diagnostic Tests: Head CT- no acute intracranial pathology  Lovenia Kim, MD 09/27/2017, 7:52 AM PGY-3, [facility] Intern pager: [phone], text pages welcome

## 2017-09-28 ENCOUNTER — Inpatient Hospital Stay: Payer: Medicare Other | Admitting: Internal Medicine

## 2017-09-28 LAB — BASIC METABOLIC PANEL
ANION GAP: 10 (ref 5–15)
BUN: 28 mg/dL — AB (ref 8–23)
CO2: 28 mmol/L (ref 22–32)
Calcium: 8.6 mg/dL — ABNORMAL LOW (ref 8.9–10.3)
Chloride: 100 mmol/L (ref 98–111)
Creatinine, Ser: 1.66 mg/dL — ABNORMAL HIGH (ref 0.61–1.24)
GFR calc Af Amer: 48 mL/min — ABNORMAL LOW (ref >60)
GFR, EST NON AFRICAN AMERICAN: 41 mL/min — AB (ref >60)
GLUCOSE: 135 mg/dL — AB (ref 70–99)
Potassium: 4.6 mmol/L (ref 3.5–5.1)
Sodium: 138 mmol/L (ref 135–145)

## 2017-09-28 LAB — GLUCOSE, CAPILLARY
GLUCOSE-CAPILLARY: 170 mg/dL — AB (ref 70–99)
Glucose-Capillary: 125 mg/dL — ABNORMAL HIGH (ref 70–99)
Glucose-Capillary: 148 mg/dL — ABNORMAL HIGH (ref 70–99)
Glucose-Capillary: 179 mg/dL — ABNORMAL HIGH (ref 70–99)

## 2017-09-28 LAB — CBC
HEMATOCRIT: 27.7 % — AB (ref 39.0–52.0)
Hemoglobin: 8.7 g/dL — ABNORMAL LOW (ref 13.0–17.0)
MCH: 27.4 pg (ref 26.0–34.0)
MCHC: 31.4 g/dL (ref 30.0–36.0)
MCV: 87.1 fL (ref 78.0–100.0)
PLATELETS: 137 10*3/uL — AB (ref 150–400)
RBC: 3.18 MIL/uL — ABNORMAL LOW (ref 4.22–5.81)
RDW: 15.3 % (ref 11.5–15.5)
WBC: 4.5 10*3/uL (ref 4.0–10.5)

## 2017-09-28 LAB — CULTURE, BLOOD (ROUTINE X 2)
Culture: NO GROWTH
Special Requests: ADEQUATE

## 2017-09-28 MED ORDER — CARVEDILOL 12.5 MG PO TABS
12.5000 mg | ORAL_TABLET | Freq: Two times a day (BID) | ORAL | Status: DC
  Administered 2017-09-28 – 2017-10-01 (×8): 12.5 mg via ORAL
  Filled 2017-09-28 (×8): qty 1

## 2017-09-28 MED ORDER — TORSEMIDE 20 MG PO TABS
80.0000 mg | ORAL_TABLET | Freq: Every day | ORAL | Status: DC
  Administered 2017-09-29 – 2017-09-30 (×2): 80 mg via ORAL
  Filled 2017-09-28 (×2): qty 4

## 2017-09-28 MED ORDER — FUROSEMIDE 80 MG PO TABS
80.0000 mg | ORAL_TABLET | Freq: Three times a day (TID) | ORAL | Status: AC
  Administered 2017-09-28 (×2): 80 mg via ORAL
  Filled 2017-09-28 (×2): qty 1

## 2017-09-28 NOTE — Progress Notes (Addendum)
Advanced Heart Failure Rounding Note  PCP-Cardiologist: No primary care provider on file.   Subjective:    Transitioned to PO lasix yesterday. Brisk UOP with -1.8 L. Weight down ?10 lbs. Creatinine stable 1.66.  Hemoglobin 8.7. Last transfusion 8/24.   Palliative saw him Saturday. He remains full code.   Troponin Peak 4> 2.9. Denies CP. SBP 140-160s  Blood cultures positive for coag negative staph (probably contaminant). Not on any abx. WBC normal. Afebrile.  Denies CP, SOB, bleeding. Not very interactive.   Myoview 09/25/17: EF 45% no ischemia (Reviewed by Dr Haroldine Laws)  Objective:   Weight Range: 72.6 kg Body mass index is 24.34 kg/m.   Vital Signs:   Temp:  [97.6 F (36.4 C)-98.9 F (37.2 C)] 97.6 F (36.4 C) (08/26 0757) Pulse Rate:  [75-84] 84 (08/26 0757) Resp:  [18-22] 19 (08/26 0757) BP: (145-177)/(61-89) 166/72 (08/26 0757) SpO2:  [92 %-100 %] 96 % (08/26 0757) Weight:  [72.6 kg] 72.6 kg (08/26 0500) Last BM Date: 09/27/17  Weight change: Filed Weights   09/26/17 0500 09/27/17 0435 09/28/17 0500  Weight: 80.5 kg 77.2 kg 72.6 kg    Intake/Output:   Intake/Output Summary (Last 24 hours) at 09/28/2017 0813 Last data filed at 09/28/2017 0300 Gross per 24 hour  Intake 960 ml  Output 3175 ml  Net -2215 ml      Physical Exam  General: Appears chronically ill. No resp difficulty. HEENT: Normal Neck: Supple. JVP ~7. Carotids 2+ bilat; no bruits. No thyromegaly or nodule noted. Cor: PMI nondisplaced. RRR, 2/6 SEM LUSB Lungs: CTAB, normal effort. Abdomen: Soft, non-tender, non-distended, no HSM. No bruits or masses. +BS  Extremities: No cyanosis, clubbing, or rash. RLE no edema. Left AKA Neuro: Alert & orientedx3, cranial nerves grossly intact. moves all 4 extremities w/o difficulty. Affect pleasant   Telemetry   NSR 80s. Personally reviewed.    EKG    No new tracings.   Labs    CBC Recent Labs    09/27/17 0323 09/28/17 0310  WBC 4.6  4.5  HGB 8.0* 8.7*  HCT 25.6* 27.7*  MCV 86.8 87.1  PLT 104* 761*   Basic Metabolic Panel Recent Labs    09/27/17 0323 09/28/17 0310  NA 140 138  K 4.2 4.6  CL 101 100  CO2 27 28  GLUCOSE 148* 135*  BUN 28* 28*  CREATININE 1.59* 1.66*  CALCIUM 8.5* 8.6*   Liver Function Tests No results for input(s): AST, ALT, ALKPHOS, BILITOT, PROT, ALBUMIN in the last 72 hours. No results for input(s): LIPASE, AMYLASE in the last 72 hours. Cardiac Enzymes No results for input(s): CKTOTAL, CKMB, CKMBINDEX, TROPONINI in the last 72 hours.  BNP: BNP (last 3 results) Recent Labs    08/13/17 1050 08/25/17 1001 09/23/17 0745  BNP 334.1* 526.9* 1,815.8*    ProBNP (last 3 results) No results for input(s): PROBNP in the last 8760 hours.   D-Dimer No results for input(s): DDIMER in the last 72 hours. Hemoglobin A1C No results for input(s): HGBA1C in the last 72 hours. Fasting Lipid Panel No results for input(s): CHOL, HDL, LDLCALC, TRIG, CHOLHDL, LDLDIRECT in the last 72 hours. Thyroid Function Tests No results for input(s): TSH, T4TOTAL, T3FREE, THYROIDAB in the last 72 hours.  Invalid input(s): FREET3  Other results:   Imaging    No results found.   Medications:     Scheduled Medications: . aspirin  81 mg Oral Daily  . atorvastatin  40 mg Oral q1800  .  carvedilol  6.25 mg Oral BID WC  . cloNIDine  0.2 mg Transdermal Weekly  . doxazosin  8 mg Oral QPM  . enoxaparin (LOVENOX) injection  40 mg Subcutaneous Q24H  . feeding supplement (ENSURE ENLIVE)  237 mL Oral BID BM  . ferrous sulfate  325 mg Oral Q1200  . furosemide  80 mg Oral TID  . hydrALAZINE  100 mg Oral Q8H  . insulin aspart  0-9 Units Subcutaneous TID WC  . ipratropium-albuterol  3 mL Nebulization TID  . isosorbide mononitrate  120 mg Oral Daily  . levothyroxine  100 mcg Oral QAC breakfast  . pantoprazole  40 mg Oral BID  . potassium chloride  40 mEq Oral BID    Infusions: . albuterol Stopped  (09/23/17 1414)    PRN Medications: acetaminophen **OR** acetaminophen, hydrALAZINE, nitroGLYCERIN, polyethylene glycol    Patient Profile   Leonard Aston Poseyis a 67 y.o.malewith history of CAD s/p CABG, PAD, chronic diastolic CHF, CKD stage IV, COPD on home oxygen, and HTN.   Admitted from SNF with hypoxic respiratory distress.   Assessment/Plan   1. Acute respiratory failure with hypoxia - Now on baseline O2. O2 sats stable.. - Lactic acid 3.93 > 1.5.  - Blood cultures positive for coag negative staph (probable contaminate per ID pharmacist). Not on any antibiotics. WBC 7>3.9  - CXR 8/21 with bilateral interstitial edema. - Continue incentive spirometer.   2. AMS - Head CT negative - Ammonia normal, UA negative, UDS negative - Resolved.   3. Positive troponin with hx of CAD and CABGx3 2015.  - Last LHC prior to CABG. Suspect demand ischemia.   - Denies chest pain.  - Troponin 2.11 (POC) > 3.77 > 3.98>2.9  - EKG looks unchanged from previous.   - Heparin stopped yesterday.  - Would like to avoid LHC if possible with CKD.  - Continue ASA and statin for now.  - Continue coreg 12.5 mg BID (increased by primary this am) - Myoview 09/25/17: EF 45% no ischemia (Reviewed by Dr Haroldine Laws)   4. Acute on chronic diastolic CHF: WCBJ6/2831: EF 65-70%, grade 2 DD, mild TR, PA peak pressure 46 mmHg ECHO repeated. EF 50-55% Left Atrium dilated. RV normal.  I/O not accurate. Weights all over the place. Needs to be weighed the same way daily.  - Volume status stable. Continue lasix 80 mg TID. He was on torsemide 60 mg daily PTA. Creatinine stable 1.66. - Continue 40 meq potassium twice daily.  - Not on spiro with CKD. - Continue ted hose.   5. HTN: - On hydralazine 100 mg TID, imdur 120 mg daily, cardura 8 mg daily, and clonidine patch 0.2 mg weekly at home. - SBP remains elevated 140-160s. - Coreg increased this am. Consider amlodipine if remains elevated after increased  coreg dose.   6. COPD: Gold stage III -Severe obstruction on prior PFTs. He is back home 2 L [place] this morning.  - Follows with Dr. Melvyn Novas outpatient. No change.   7. CKD: Stage V.- has L AVF. Follows withDr [facility]. Baseline 1.6-2.2 - Creatinine stable 1.66 - Family requests that nephrology be notified of his admission. Defer to primary team.   8.Hx ofDVT - Diagnosed 08/30/16. No change.   9. Hx of pleural effusions R>L  10. Extensive PAD - s/p L AKA for extensive LLE wounds with no re-vascularization options in 03/2017 by Dr. Donzetta Matters. No change.    11. Anemia - Hemoglobin 8.7 this am. Last transfusion 8/24. Received IV  iron last admission. No obvious source of bleeding. Per primary.  12Pulmonary HTN - PA peak pressure 63 mmHg on Echo 07/20/17 - Suspect primarily WHO Group III and WHO Group II pulmonary HTN. Suspect he has mild/modPulm Hypertensionat baselineand pressures wereelevated in the setting of volume overload. No change.   13. Deconditioning - Living at SNF. Limited by Left AKA. No change.   14. Hypokalemia  - K 4.6. Resolved.    Medication concerns reviewed with patient and pharmacy team. Barriers identified: none at this time  Will need to discuss with MD whether he should be on lasix or torsemide. He has follow up scheduled for next week.   Addendum: Spoke with Dr Aundra Dubin regarding pt's diuretics. Will switch PO lasix to torsemide at increased dose tomorrow.  Length of Stay: [place], NP  09/28/2017, 8:13 AM  Advanced Heart Failure Team Pager [phone] (M-F; 7a - 4p)  Please contact [facility] for night-coverage after hours (4p -7a ) and weekends on [email or web address]  Patient seen with NP, agree with the above note.  No ischemia on Cardiolite over the weekend.  On exam, he does not appear volume overloaded.   - Would transition diuretic to torsemide 80 mg daily.  - Hold off on cath with nonischemic Cardiolite.  - From cardiology  standpoint, should be ready to return to facility.  - He has somewhat limited understanding of his medical condition.   Loralie Champagne 09/28/2017 3:56 PM

## 2017-09-28 NOTE — Progress Notes (Addendum)
Family Medicine Teaching Service Daily Progress Note Intern Pager: [phone]  [name] Medical record number: [id number] Date of birth: 11-Jun-1950 Age: 67 y.o. Gender: male  Primary Care Provider: Kathrene Alu, MD  Code Status: FULL  Consultations: HF Admission date: 8/21   Pt Overview and Major Events to Date:  8/21 admitted for acute respiratory failure 2/2 CHF exacerbation NSTEMI 8/22 off BiPAP  8/22 off heparin gtt   Assessment and Plan: VEARL ALLBAUGH is a 67 y.o. male presenting with AMS. PMH is significant for HFpEF, COPD on home oxygen, CAD s/p CABG, PAD, CKD IV, HTN, T2DM, HLD, hypothyroidism.    Acute Hypoxic Respiratory Failure 2/2 CHF exacerbation and NSTEMI s/p CABG Improved, stable on home 2L per [place] overnight and satting 92%. VSS.   Wt  -10 lbs since yesterday with diuresis, currently with po Lasix 80 mg TID, transitioned from IV lasix yesterday. Home diuretic is torsemide 60 mg daily.   UOP 3.1L over the course of the last 24h.  SCr stable at 1.66. Heart failure following, appreciate recs.  On exam this morning pt appears euvolemic with clear lungs and no edema.   S/p heparin gtt (off 8/22).  Echo on admission with EF 50-55%.  S/p myoview stress test 8/23- results: no ST segment deviation noted during stress test, intermediate risk study, LV EF mildly decreased at 45-54%.   -monitor on tele    - f/u HF recs  - po lasix 80 mg TID  -continue ASA, statin, coreg  - O2 supplement, on home 2L per [place]  - continue home  glucopyrrolate BID - duonebs q4prn - continue Spiriva - daily wts, strict I/Os  Asymptomatic bacteriuria  Patient denies urinary symptoms.   Ucx growing >100,000 colonies of Klebsiella pneumonia, confirmed ESBL.  Likely colonization, will hold off on treatment at this time unless symptomatic.    AMS. Resolved. CT head negative for acute intracranial pathology. Patient is back to baseline.  Seen by palliative 8/24 - pt is full code  by choice, appears to have limited understanding of the complexity of his health conditions and would benefit from further goals of care, phone number of community-based palliative care divisions are in palliative note.  - continue to monitor  Elevated troponin in setting of CAD s/p CABG. Stable, s/p NSTEMI and heparin gtt.  Trop have peaked at 4.00 without ST changes on EKG and has trended down to 2.93. No longer trending.  Denies chest pain at this time.  - continue ASA, statin, BB - continue to monitor   HTN. At home on coreg, hydralazine, imdur, doxazosin, clonidine patch. BP 160/70 today - continue home meds - monitor vitals  T2DM. Last A1c 6.9 on 07/05/17. 146 this morning.   - monitor CBGs - sSSI  CKD with anemia, unstable.  Baseline Cr 1.7-2.3, baseline Hgb 7.5. S/p 1u prbc transfusion on 8/23 and 1u 8/24.  Transfusion threshold is 8.0.  Post trans H/H on 8/24 was 8.5.  Hgb stable this morning at 8.7. - continue home ferrous sulfate  BPH - continue home doxazosin  Hypothyroidism last TSH 3.338 on 07/10/17 - continue home synthroid   FEN/GI: DYS 2, protonix   Prophylaxis: lovenox  Disposition: dispo pending clinical improvement   Subjective: Patient feels well, no acute events overnight.  Currently eating breakfast.  He is breathing comfortably on his home 2L O2.  Has no current complaints. Denies CP, palpitations, leg swelling.   Objective: Temp:  [97.7 F (36.5 C)-98.9  F (37.2 C)] 98.9 F (37.2 C) (08/26 0300) Pulse Rate:  [75-81] 81 (08/26 0300) Resp:  [18-22] 18 (08/26 0300) BP: (145-178)/(61-89) 160/70 (08/26 0300) SpO2:  [92 %-100 %] 92 % (08/26 0300) Weight:  [72.6 kg] 72.6 kg (08/26 0500)  Physical Exam: General: 67 yo male, awake, NAD  Cardiovascular: RRR no MRG, sternal surgical scar  Respiratory: CTAB,  No increased work of breathing, on 2L per [place]  Abdomen: soft, NTND, +bs  Extremities: right leg well perfused, RLE with trace edema,  left leg  BKA  Psych: normal mood and affect   Laboratory: Recent Labs  Lab 09/26/17 0316 09/26/17 1837 09/27/17 0323 09/28/17 0310  WBC 3.9*  --  4.6 4.5  HGB 7.4* 8.5* 8.0* 8.7*  HCT 23.8* 26.9* 25.6* 27.7*  PLT PLATELET CLUMPS NOTED ON SMEAR, UNABLE TO ESTIMATE  --  104* 137*   Recent Labs  Lab 09/23/17 0745  09/26/17 0316 09/27/17 0323 09/28/17 0310  NA 146*   < > 142 140 138  K 3.7   < > 3.6 4.2 4.6  CL 111   < > 107 101 100  CO2 18*   < > 27 27 28   BUN 33*   < > 30* 28* 28*  CREATININE 2.31*   < > 1.64* 1.59* 1.66*  CALCIUM 8.2*   < > 8.3* 8.5* 8.6*  PROT 5.6*  --   --   --   --   BILITOT 1.4*  --   --   --   --   ALKPHOS 145*  --   --   --   --   ALT 23  --   --   --   --   AST 57*  --   --   --   --   GLUCOSE 227*   < > 143* 148* 135*   < > = values in this interval not displayed.    Imaging/Diagnostic Tests: Head CT- no acute intracranial pathology  Lovenia Kim, MD 09/28/2017, 7:13 AM PGY-3, [place] Intern pager: [phone], text pages welcome

## 2017-09-28 NOTE — Progress Notes (Signed)
  Speech Language Pathology Treatment: Dysphagia  Patient Details Name: Leonard [name] MRN: [id number] DOB: 09/20/1950 Today's Date: 09/28/2017 Time: 1131-1140 SLP Time Calculation (min) (ACUTE ONLY): 9 min  Assessment / Plan / Recommendation Clinical Impression  Pt continues to consume liquids at a rapid rate, with SLP providing Min cues for pacing. He has mild residuals given extra time. Thin liquids washes facilitate clearance. No overt signs of aspiration are observed. Recommend continuing current diet, which is pt's baseline textures. Will f/u briefly for tolerance and implementation of swallowing strategies.   HPI HPI: Pt is a 67 y.o. male admitted with AMS, acute respiratory failure initially requiring BiPAP, and elevated troponin. CXR suggestive of interstitial edema; CT head negative. Pt was seen by SLP during recent admission in June 2019, initially NPO after self-extubation but advancing to Dys 2 diet, thin liquids prior to discharge. PMH: CAD s/p CABG, PAD, chronic diastolic CHF, CKD stage IV, COPD on home oxygen, and HTN      SLP Plan  Continue with current plan of care       Recommendations  Diet recommendations: Dysphagia 2 (fine chop);Thin liquid Liquids provided via: Cup;Straw Medication Administration: Whole meds with puree Supervision: Patient able to self feed;Full supervision/cueing for compensatory strategies Compensations: Slow rate;Small sips/bites;Minimize environmental distractions Postural Changes and/or Swallow Maneuvers: Seated upright 90 degrees                Oral Care Recommendations: Oral care BID Follow up Recommendations: Skilled Nursing facility SLP Visit Diagnosis: Dysphagia, unspecified (R13.10) Plan: Continue with current plan of care       GO                [name] 09/28/2017, 11:46 AM  [name], M.A. CCC-SLP [phone]

## 2017-09-29 DIAGNOSIS — N183 Chronic kidney disease, stage 3 (moderate): Secondary | ICD-10-CM

## 2017-09-29 LAB — CBC
HCT: 27.1 % — ABNORMAL LOW (ref 39.0–52.0)
HEMOGLOBIN: 8.4 g/dL — AB (ref 13.0–17.0)
MCH: 27.1 pg (ref 26.0–34.0)
MCHC: 31 g/dL (ref 30.0–36.0)
MCV: 87.4 fL (ref 78.0–100.0)
Platelets: UNDETERMINED 10*3/uL (ref 150–400)
RBC: 3.1 MIL/uL — AB (ref 4.22–5.81)
RDW: 15.5 % (ref 11.5–15.5)
WBC: 5 10*3/uL (ref 4.0–10.5)

## 2017-09-29 LAB — BASIC METABOLIC PANEL
Anion gap: 11 (ref 5–15)
BUN: 34 mg/dL — AB (ref 8–23)
CHLORIDE: 99 mmol/L (ref 98–111)
CO2: 30 mmol/L (ref 22–32)
CREATININE: 1.84 mg/dL — AB (ref 0.61–1.24)
Calcium: 9.1 mg/dL (ref 8.9–10.3)
GFR calc Af Amer: 42 mL/min — ABNORMAL LOW (ref >60)
GFR calc non Af Amer: 37 mL/min — ABNORMAL LOW (ref >60)
Glucose, Bld: 122 mg/dL — ABNORMAL HIGH (ref 70–99)
POTASSIUM: 5 mmol/L (ref 3.5–5.1)
SODIUM: 140 mmol/L (ref 135–145)

## 2017-09-29 LAB — GLUCOSE, CAPILLARY
GLUCOSE-CAPILLARY: 128 mg/dL — AB (ref 70–99)
GLUCOSE-CAPILLARY: 130 mg/dL — AB (ref 70–99)
Glucose-Capillary: 193 mg/dL — ABNORMAL HIGH (ref 70–99)
Glucose-Capillary: 151 mg/dL — ABNORMAL HIGH (ref 70–99)

## 2017-09-29 MED ORDER — TIOTROPIUM BROMIDE MONOHYDRATE 18 MCG IN CAPS
18.0000 ug | ORAL_CAPSULE | Freq: Every day | RESPIRATORY_TRACT | Status: DC
  Administered 2017-09-30 – 2017-10-01 (×2): 18 ug via RESPIRATORY_TRACT
  Filled 2017-09-29: qty 5

## 2017-09-29 MED ORDER — POTASSIUM CHLORIDE CRYS ER 20 MEQ PO TBCR
40.0000 meq | EXTENDED_RELEASE_TABLET | Freq: Every day | ORAL | Status: DC
  Administered 2017-09-30 – 2017-10-01 (×2): 40 meq via ORAL
  Filled 2017-09-29 (×2): qty 2

## 2017-09-29 NOTE — Progress Notes (Signed)
Family Medicine Teaching Service Daily Progress Note Intern Pager: [phone]  [name] Medical record number: [id number] Date of birth: [birth year] Age: 67 y.o. Gender: male  Primary Care Provider: Kathrene Alu, MD  Code Status: FULL  Consultations: HF Admission date: 8/21    Pt Overview and Major Events to Date:  8/21 admitted for acute respiratory failure 2/2 CHF exacerbation NSTEMI 8/22 off BiPAP  8/22 off heparin gtt   Assessment and Plan: CORTLIN MARANO is a 67 y.o. male presenting with AMS. PMH is significant for HFpEF, COPD on home oxygen, CAD s/p CABG, PAD, CKD IV, HTN, T2DM, HLD, hypothyroidism.    Acute Hypoxic Respiratory Failure 2/2 CHF exacerbation and NSTEMI s/p CABG Improved, stable on home 2L per [place] overnight and satting 100%. VSS overnight except he is hypertensive to 161/72 this morning.   Wt charted as +3 lbs since yesterday with po diuresis (now on torsemide 80 q daily). Home torsemide dose is 60 mg daily.  Unsure of accuracy of weights as he has continued to have good  UOP 3.6L over the course of the last 24h.  SCr with mild bump with diuretic change at 1.66>1.84. Heart failure following, appreciate recs.  On exam this morning pt continues to appear euvolemic with clear lungs and no lower extremity edema. He is medically stable for discharge back to SNF facility today, will reach out to CSW to facilitate this.  -monitor on tele    - f/u HF recs  - po torsemide 80 mg q daily -continue ASA, statin, coreg  - O2 supplement, on home 2L per [place]  - continue home  glucopyrrolate BID - duonebs q4prn - continue Spiriva - daily wts, strict I/Os  Asymptomatic bacteriuria  Patient denies urinary symptoms.   Ucx growing >100,000 colonies of Klebsiella pneumonia, confirmed ESBL.  Likely colonization, will hold off on treatment at this time unless symptomatic.    AMS. Resolved. Patient is at baseline. - continue to monitor  Elevated troponin in  setting of CAD s/p CABG. Stable, s/p NSTEMI and heparin gtt.  Trop have peaked at 4.00 without ST changes on EKG and has trended down to 2.93. No longer trending.  Denies chest pain at this time.  - continue ASA, statin, BB - continue to monitor   HTN. At home on coreg, hydralazine, imdur, doxazosin, clonidine patch. BP 160/70 today - continue home meds - monitor vitals  T2DM. Last A1c 6.9 on 07/05/17. 128 this morning.   - monitor CBGs - sSSI  CKD with anemia, stable.   Hgb 8.4 this AM. - continue home ferrous sulfate  BPH - continue home doxazosin  Hypothyroidism last TSH 3.338 on 07/10/17 - continue home synthroid   FEN/GI: DYS 2, protonix   Prophylaxis: lovenox  Disposition: medically stable for discharge back to SNF facility today  Subjective: Patient in good spirits, niece is in the room and updated as well.  Appears comfortable on home 2L O2. No acute overnight events.  Denies CP, palpitations, leg swelling.   Objective: Temp:  [98 F (36.7 C)-98.4 F (36.9 C)] 98.4 F (36.9 C) (08/27 0733) Pulse Rate:  [68-103] 74 (08/27 0733) Resp:  [18-20] 20 (08/27 0733) BP: (131-161)/(51-79) 161/72 (08/27 0733) SpO2:  [93 %-100 %] 100 % (08/27 0733) Weight:  [74.1 kg] 74.1 kg (08/27 0400)  Physical Exam: General: 67 yo male, NAD  Cardiovascular: RRR no MRG, 2+ pedal pulses Respiratory: CTAB, no crackles, 2L O2 per   Abdomen: soft, NTND, +  bs  Extremities: right leg well perfused, RLE-no edema,  left leg BKA  Psych: normal mood and affect   Laboratory: Recent Labs  Lab 09/27/17 0323 09/28/17 0310 09/29/17 0319  WBC 4.6 4.5 5.0  HGB 8.0* 8.7* 8.4*  HCT 25.6* 27.7* 27.1*  PLT 104* 137* PLATELET CLUMPS NOTED ON SMEAR, UNABLE TO ESTIMATE   Recent Labs  Lab 09/23/17 0745  09/27/17 0323 09/28/17 0310 09/29/17 0319  NA 146*   < > 140 138 140  K 3.7   < > 4.2 4.6 5.0  CL 111   < > 101 100 99  CO2 18*   < > 27 28 30   BUN 33*   < > 28* 28* 34*  CREATININE  2.31*   < > 1.59* 1.66* 1.84*  CALCIUM 8.2*   < > 8.5* 8.6* 9.1  PROT 5.6*  --   --   --   --   BILITOT 1.4*  --   --   --   --   ALKPHOS 145*  --   --   --   --   ALT 23  --   --   --   --   AST 57*  --   --   --   --   GLUCOSE 227*   < > 148* 135* 122*   < > = values in this interval not displayed.    Imaging/Diagnostic Tests: Head CT- no acute intracranial pathology  Lovenia Kim, MD 09/29/2017, 8:06 AM PGY-3, [place] Intern pager: [phone], text pages welcome

## 2017-09-29 NOTE — Progress Notes (Addendum)
Advanced Heart Failure Rounding Note  PCP-Cardiologist: No primary care provider on file.   Subjective:    Great UOP on PO lasix yesterday. -3.4 L. Weight up ?3 lbs. Creatinine trending up 1.66 > 1.84  Hemoglobin 8.4. Last transfusion 8/24.   Palliative saw him Saturday. He remains full code.   Troponin Peak 4> 2.9.  Myoview 09/25/17: EF 45% no ischemia (Reviewed by Dr Haroldine Laws). SBP slightly better 130-150s  Blood cultures positive for coag negative staph (probably contaminant). Not on any abx. WBC normal. Afebrile.   Feels good this morning. No swelling. Has productive cough with clear sputum. Denies SOB, orthopnea, or CP. Family wants him to go to a different SNF.   Objective:   Weight Range: 74.1 kg Body mass index is 24.84 kg/m.   Vital Signs:   Temp:  [98 F (36.7 C)-98.4 F (36.9 C)] 98.4 F (36.9 C) (08/27 0733) Pulse Rate:  [68-103] 74 (08/27 0733) Resp:  [18-20] 20 (08/27 0733) BP: (131-161)/(51-79) 161/72 (08/27 0733) SpO2:  [93 %-100 %] 99 % (08/27 0839) Weight:  [74.1 kg] 74.1 kg (08/27 0400) Last BM Date: 09/27/17  Weight change: Filed Weights   09/27/17 0435 09/28/17 0500 09/29/17 0400  Weight: 77.2 kg 72.6 kg 74.1 kg    Intake/Output:   Intake/Output Summary (Last 24 hours) at 09/29/2017 0858 Last data filed at 09/29/2017 0734 Gross per 24 hour  Intake 240 ml  Output 4050 ml  Net -3810 ml      Physical Exam   General: Appears chronically ill. No resp difficulty. HEENT: Normal Neck: Supple. JVP 5-6. Carotids 2+ bilat; no bruits. No thyromegaly or nodule noted. Cor: PMI nondisplaced. RRR, 2/6 SEM LUSB Lungs: CTAB, normal effort. Abdomen: Soft, non-tender, non-distended, no HSM. No bruits or masses. +BS  Extremities: No cyanosis, clubbing, or rash. RLE no edema. Left AKA.  Neuro: Alert & orientedx3, cranial nerves grossly intact. moves all 4 extremities w/o difficulty. Affect pleasant   Telemetry   NSR 70-80s. Personally reviewed.     EKG    No new tracings.   Labs    CBC Recent Labs    09/28/17 0310 09/29/17 0319  WBC 4.5 5.0  HGB 8.7* 8.4*  HCT 27.7* 27.1*  MCV 87.1 87.4  PLT 137* PLATELET CLUMPS NOTED ON SMEAR, UNABLE TO ESTIMATE   Basic Metabolic Panel Recent Labs    09/28/17 0310 09/29/17 0319  NA 138 140  K 4.6 5.0  CL 100 99  CO2 28 30  GLUCOSE 135* 122*  BUN 28* 34*  CREATININE 1.66* 1.84*  CALCIUM 8.6* 9.1   Liver Function Tests No results for input(s): AST, ALT, ALKPHOS, BILITOT, PROT, ALBUMIN in the last 72 hours. No results for input(s): LIPASE, AMYLASE in the last 72 hours. Cardiac Enzymes No results for input(s): CKTOTAL, CKMB, CKMBINDEX, TROPONINI in the last 72 hours.  BNP: BNP (last 3 results) Recent Labs    08/13/17 1050 08/25/17 1001 09/23/17 0745  BNP 334.1* 526.9* 1,815.8*    ProBNP (last 3 results) No results for input(s): PROBNP in the last 8760 hours.   D-Dimer No results for input(s): DDIMER in the last 72 hours. Hemoglobin A1C No results for input(s): HGBA1C in the last 72 hours. Fasting Lipid Panel No results for input(s): CHOL, HDL, LDLCALC, TRIG, CHOLHDL, LDLDIRECT in the last 72 hours. Thyroid Function Tests No results for input(s): TSH, T4TOTAL, T3FREE, THYROIDAB in the last 72 hours.  Invalid input(s): FREET3  Other results:   Imaging  No results found.   Medications:     Scheduled Medications: . aspirin  81 mg Oral Daily  . atorvastatin  40 mg Oral q1800  . carvedilol  12.5 mg Oral BID WC  . cloNIDine  0.2 mg Transdermal Weekly  . doxazosin  8 mg Oral QPM  . enoxaparin (LOVENOX) injection  40 mg Subcutaneous Q24H  . feeding supplement (ENSURE ENLIVE)  237 mL Oral BID BM  . ferrous sulfate  325 mg Oral Q1200  . hydrALAZINE  100 mg Oral Q8H  . insulin aspart  0-9 Units Subcutaneous TID WC  . ipratropium-albuterol  3 mL Nebulization TID  . isosorbide mononitrate  120 mg Oral Daily  . levothyroxine  100 mcg Oral QAC breakfast   . pantoprazole  40 mg Oral BID  . potassium chloride  40 mEq Oral BID  . torsemide  80 mg Oral Daily    Infusions: . albuterol Stopped (09/23/17 1414)    PRN Medications: acetaminophen **OR** acetaminophen, hydrALAZINE, nitroGLYCERIN, polyethylene glycol    Patient Profile   Leonard Goodrich Poseyis a 67 y.o.malewith history of CAD s/p CABG, PAD, chronic diastolic CHF, CKD stage IV, COPD on home oxygen, and HTN.   Admitted from SNF with hypoxic respiratory distress.   Assessment/Plan   1. Acute respiratory failure with hypoxia - Now on baseline O2. O2 sats stable. No change.  - Lactic acid 3.93 > 1.5.  - Blood cultures positive for coag negative staph (probable contaminate per ID pharmacist). Not on any antibiotics. WBC 7>3.9  - CXR 8/21 with bilateral interstitial edema. - Continue incentive spirometer.   2. AMS - Head CT negative - Ammonia normal, UA negative, UDS negative - Resolved.    3. Positive troponin with hx of CAD and CABGx3 2015.  - Last LHC prior to CABG. Suspect demand ischemia.   - Troponin 2.11 (POC) > 3.77 > 3.98>2.9  - EKG looks unchanged from previous.   - Heparin stopped yesterday.  - Would like to avoid LHC if possible with CKD.  - Continue ASA and statin for now.  - Continue coreg 12.5 mg BID (increased by primary this am) - Myoview 09/25/17: EF 45% no ischemia  - No CP   4. Acute on chronic diastolic CHF: LMBE6/7544: EF 65-70%, grade 2 DD, mild TR, PA peak pressure 46 mmHg ECHO repeated. EF 50-55% Left Atrium dilated. RV normal.  I/O not accurate. Weights all over the place. Needs to be weighed the same way daily.  - Volume status stable. Creatinine trending up 1.66 > 1.84. Already received torsemide 80 mg this morning (previously only 60 mg daily).  - K 5.0. Decrease potassium supp to 40 meq daily.  - Not on spiro with CKD. - Continue ted hose.   5. HTN: - On hydralazine 100 mg TID, imdur 120 mg daily, cardura 8 mg daily, coreg 12.5  mg BID, and clonidine patch 0.2 mg weekly.  - SBP slightly better 130-150s  6. COPD: Gold stage III -Severe obstruction on prior PFTs. He is back home 2 L [place] this morning.  - Follows with Dr. Melvyn Novas outpatient. No change.   7. CKD: Stage V.- has L AVF. Follows withDr [facility]. Baseline 1.6-2.2 - Creatinine trending up 1.66 > 1.84 - Family requests that nephrology be notified of his admission. Defer to primary team.   8.Hx ofDVT - Diagnosed 08/30/16. No change.   9. Hx of pleural effusions R>L  10. Extensive PAD - s/p L AKA for extensive LLE wounds with  no re-vascularization options in 03/2017 by Dr. Donzetta Matters. No change.     11. Anemia - Hemoglobin 8.4 this am. Last transfusion 8/24. Received IV iron last admission. No obvious source of bleeding. Per primary.  12Pulmonary HTN - PA peak pressure 63 mmHg on Echo 07/20/17 - Suspect primarily WHO Group III and WHO Group II pulmonary HTN. Suspect he has mild/modPulm Hypertensionat baselineand pressures wereelevated in the setting of volume overload. No change.   13. Deconditioning - Living at SNF. Limited by Left AKA. Family would like to change facilities. Case manager working on this.   14. Hypokalemia  - K 5.0. Decrease supp as above.   Medication concerns reviewed with patient and pharmacy team. Barriers identified: none at this time  Seems stable for discharge from HF perspective.   Heart failure team will sign off as of 09/29/17  HF Medication Recommendations for Home: (Please wait for confirmation from Dr Aundra Dubin) ASA 81 mg daily Atorvastatin 40 mg daily Carvedilol 12.5 mg BID Clonidine 0.2 mg TD weekly Doxazosin 8 mg qHS Hydralazine 100 mg TID Imdur 120 mg daily Torsemide 80 mg daily Potassium 40 meq daily  Other recommendations (Labs,testing, etc): BMET at follow up  HF follow up as an outpatient: 10/06/17 10 am   Length of Stay: [place], NP  09/29/2017, 8:58 AM  Advanced Heart Failure  Team Pager [phone] (M-F; [place])  Please contact [facility] for night-coverage after hours (4p -7a ) and weekends on [email or web address]  Patient seen with NP, agree with the above note.  No ischemia on Cardiolite over the weekend.  On exam, he does not appear volume overloaded.   - Continue torsemide 80 mg daily for home.  - Hold off on cath with nonischemic Cardiolite.  - From cardiology standpoint, should be ready to return to facility on meds as above.  Will set up HF followup.  - He has somewhat limited understanding of his medical condition.   Loralie Champagne 09/29/2017 1:10 PM

## 2017-09-29 NOTE — Clinical Social Work Note (Addendum)
Discussed discharge plan with patient. He asked that CSW call his sister, [name]. Unable to reach her. Left voicemail for niece, Karie Kirks.   Dayton Scrape, [place] [phone]  2:20 pm CSW spoke with patient's sister, [name]. Provided bed offers. She will review with her niece, Karie Kirks. Patient's sister stated that in the past they had tried to get into [facility] for long-term care. CSW spoke with admissions coordinator and there is a waiting list. Patient's sister stated they are considering [facility].  Dayton Scrape, CSW [phone]  3:16 pm CSW provided update to patient's sister. She is interested in getting patient on waiting list at [facility]. CSW left message for admissions coordinator to see how that needed to be done. Patient's sister and niece are considering patient returning to [facility] at discharge.   Dayton Scrape, [place] [phone]  4:17 pm Patient's niece will tour [facility] today after work.  Dayton Scrape, [place]

## 2017-09-30 LAB — CBC
HEMATOCRIT: 27.6 % — AB (ref 39.0–52.0)
HEMOGLOBIN: 8.4 g/dL — AB (ref 13.0–17.0)
MCH: 26.8 pg (ref 26.0–34.0)
MCHC: 30.4 g/dL (ref 30.0–36.0)
MCV: 87.9 fL (ref 78.0–100.0)
Platelets: 146 10*3/uL — ABNORMAL LOW (ref 150–400)
RBC: 3.14 MIL/uL — AB (ref 4.22–5.81)
RDW: 15.7 % — ABNORMAL HIGH (ref 11.5–15.5)
WBC: 5.9 10*3/uL (ref 4.0–10.5)

## 2017-09-30 LAB — BASIC METABOLIC PANEL
Anion gap: 9 (ref 5–15)
BUN: 37 mg/dL — AB (ref 8–23)
CHLORIDE: 97 mmol/L — AB (ref 98–111)
CO2: 32 mmol/L (ref 22–32)
Calcium: 9.1 mg/dL (ref 8.9–10.3)
Creatinine, Ser: 2.11 mg/dL — ABNORMAL HIGH (ref 0.61–1.24)
GFR calc Af Amer: 36 mL/min — ABNORMAL LOW (ref >60)
GFR calc non Af Amer: 31 mL/min — ABNORMAL LOW (ref >60)
Glucose, Bld: 171 mg/dL — ABNORMAL HIGH (ref 70–99)
POTASSIUM: 4.7 mmol/L (ref 3.5–5.1)
Sodium: 138 mmol/L (ref 135–145)

## 2017-09-30 LAB — GLUCOSE, CAPILLARY
GLUCOSE-CAPILLARY: 143 mg/dL — AB (ref 70–99)
Glucose-Capillary: 198 mg/dL — ABNORMAL HIGH (ref 70–99)
Glucose-Capillary: 159 mg/dL — ABNORMAL HIGH (ref 70–99)
Glucose-Capillary: 157 mg/dL — ABNORMAL HIGH (ref 70–99)

## 2017-09-30 MED ORDER — SODIUM CHLORIDE 0.9 % IV BOLUS
500.0000 mL | Freq: Once | INTRAVENOUS | Status: AC
  Administered 2017-09-30: 500 mL via INTRAVENOUS

## 2017-09-30 MED ORDER — TORSEMIDE 20 MG PO TABS: 60.0000 mg | ORAL_TABLET | Freq: Every day | ORAL | Status: DC

## 2017-09-30 NOTE — Progress Notes (Signed)
CSW following patient for support and discharge need. CSW received phone call from family stating that niece has decided to send patient back to [place].   Rhea Pink, MSW,  [place]

## 2017-09-30 NOTE — Discharge Summary (Signed)
[facility] Discharge Summary  [name] Medical record number: [id number] Date of birth: August 29, 1950 Age: 67 y.o. Gender: male Date of Admission: 09/23/2017  Date of Discharge: 8/29 Admitting Physician: Martyn Malay, MD  Primary Care Provider: Kathrene Alu, MD Consultants: PT/OT, speech, HF team  Indication for Hospitalization: respiratory failure 2/2 CHF exacerbation  Discharge Diagnoses/Problem List:  Respiratory distress CHF CAD S/p CABG HTN T2DM CKD Anemia BPH hypothyroidism  Disposition: discharge to SNF  Discharge Condition: stable  Discharge Exam:  General: resting comfortably, NAD Card: RRR Pulm: CTAB, patient had [place] 1L not in nostrils so I removed the oxygen Abdomen: soft, non-tender to palpation, non-distended Extremities: no edema to right LE, Left LE AKA mild non-pitting edema  Brief Hospital Course:  Patient admitted from SNF for respiratory distress 2/2 CHF exacerbation. Patient was anemic on admission with no source of active bleeding. Patient received blood transfusion and returned to baseline and remained stable. Consulted HF, medically managed treatment for discharge with Torsemide 3m every other day, asa, statin, coreg 12.517mBID, hydralazine 10056mID, imdur 120m73mily, cardura 8mg 60mly, clonidine patch 0.2mg w41mly. Decreased potassium supp to 40mEq 39my. Patient's respiratory status improved to stable ORA.  Issues for Follow Up:  1. Follow up with PCP for potassium level monitoring 2. Follow up with cardiology for managing HTN, heart failure refractory to treatment 3. Follow up with palliative new consult to discuss GOC  [facility] Procedures: none  Significant Labs and Imaging:  Recent Labs  Lab 09/28/17 0310 09/29/17 0319 09/30/17 0316  WBC 4.5 5.0 5.9  HGB 8.7* 8.4* 8.4*  HCT 27.7* 27.1* 27.6*  PLT 137* PLATELET CLUMPS NOTED ON SMEAR, UNABLE TO ESTIMATE 146*   Recent Labs   Lab 09/26/17 0316 09/27/17 0323 09/28/17 0310 09/29/17 0319 09/30/17 0316  NA 142 140 138 140 138  K 3.6 4.2 4.6 5.0 4.7  CL 107 101 100 99 97*  CO2 '27 27 28 30 ' 32  GLUCOSE 143* 148* 135* 122* 171*  BUN 30* 28* 28* 34* 37*  CREATININE 1.64* 1.59* 1.66* 1.84* 2.11*  CALCIUM 8.3* 8.5* 8.6* 9.1 9.1    Results/Tests Pending at Time of Discharge: none  Discharge Medications:  Allergies as of 10/01/2017      Reactions   Penicillins Other (See Comments)   Has patient had a PCN reaction causing immediate rash, facial/tongue/throat swelling, SOB or lightheadedness with hypotension: Unknown Has patient had a PCN reaction causing severe rash involving mucus membranes or skin necrosis: Unknown Has patient had a PCN reaction that required hospitalization: Unknown Has patient had a PCN reaction occurring within the last 10 years: Unknown If all of the above answers are "NO", then may proceed with Cephalosporin use.      Medication List    STOP taking these medications   insulin aspart 100 UNIT/ML FlexPen Commonly known as:  NOVOLOG   insulin glargine 100 UNIT/ML injection Commonly known as:  LANTUS     TAKE these medications   acetaminophen 325 MG tablet Commonly known as:  TYLENOL Take 650 mg by mouth every 8 (eight) hours as needed for mild pain or fever.   aspirin EC 81 MG tablet Take 1 tablet (81 mg total) by mouth daily.   atorvastatin 40 MG tablet Commonly known as:  LIPITOR TAKE ONE (1) TABLET BY MOUTH EVERY DAY AT 6:00PM What changed:  See the new instructions.   carvedilol 12.5 MG tablet Commonly known as:  COREG Take  1 tablet (12.5 mg total) by mouth 2 (two) times daily with a meal.   cloNIDine 0.2 mg/24hr patch Commonly known as:  CATAPRES - Dosed in mg/24 hr Place 1 patch (0.2 mg total) onto the skin once a week. What changed:  when to take this   Darbepoetin Alfa 40 MCG/0.4ML Sosy injection Commonly known as:  ARANESP Inject 0.4 mLs (40 mcg total) into  the skin every 30 (thirty) days.   doxazosin 8 MG tablet Commonly known as:  CARDURA Take 1 tablet (8 mg total) by mouth every evening.   feeding supplement (ENSURE ENLIVE) Liqd Take 237 mLs by mouth 3 (three) times daily between meals.   ferrous sulfate 325 (65 FE) MG tablet Take 1 tablet (325 mg total) by mouth daily at 12 noon.   glucose blood test strip Use as instructed to test three times daily. ICD-10 code: E11.22   hydrALAZINE 100 MG tablet Commonly known as:  APRESOLINE TAKE ONE TABLET BY MOUTH EVERY EIGHT HOURS What changed:  See the new instructions.   ipratropium-albuterol 0.5-2.5 (3) MG/3ML Soln Commonly known as:  DUONEB Take 3 mLs by nebulization 4 (four) times daily as needed (shortness of breath and wheezing).   isosorbide mononitrate 120 MG 24 hr tablet Commonly known as:  IMDUR Take 1 tablet (120 mg total) by mouth daily. Start taking on:  10/02/2017 What changed:    medication strength  See the new instructions.   levothyroxine 100 MCG tablet Commonly known as:  SYNTHROID, LEVOTHROID Take 100 mcg by mouth daily before breakfast.   LONHALA MAGNAIR STARTER KIT 25 MCG/ML Soln Generic drug:  Glycopyrrolate Inhale 1 mL into the lungs 2 (two) times daily.   nitroGLYCERIN 0.4 MG SL tablet Commonly known as:  NITROSTAT Place 1 tablet (0.4 mg total) under the tongue every 5 (five) minutes as needed for chest pain.   ONETOUCH DELICA LANCETS 23N Misc 1 each by Does not apply route 3 (three) times daily. ICD-10 code: E11.22   [facility] VERIO FLEX SYSTEM w/Device Kit 1 kit by Does not apply route daily.   pantoprazole 40 MG tablet Commonly known as:  PROTONIX Take 40 mg by mouth daily.   polyethylene glycol packet Commonly known as:  MIRALAX / GLYCOLAX Take 17 g by mouth daily as needed for mild constipation or moderate constipation. What changed:    when to take this  reasons to take this   potassium chloride SA 20 MEQ tablet Commonly known as:   K-DUR,KLOR-CON Take 2 tablets (40 mEq total) by mouth daily. Start taking on:  10/02/2017   torsemide 20 MG tablet Commonly known as:  DEMADEX Take 4 tablets (80 mg total) by mouth every other day. Start taking on:  10/02/2017 What changed:    how much to take  when to take this       Discharge Instructions: Please refer to Patient Instructions section of EMR for full details.  Patient was counseled important signs and symptoms that should prompt return to medical care, changes in medications, dietary instructions, activity restrictions, and follow up appointments.   Follow-Up Appointments: Follow-up Information    [facility] Follow up on 10/06/2017.   Specialty:  Cardiology Why:  10:00 am. Heart failure follow up. Garage code: [id number]. Contact information: [address] [id number] mc [place], [place], DO 09/30/2017, 8:52 AM PGY-1, [place]

## 2017-09-30 NOTE — Progress Notes (Signed)
  Speech Language Pathology Treatment: Dysphagia  Patient Details Name: Leonard [name] MRN: [id number] DOB: 12/14/1950 Today's Date: 09/30/2017 Time: 2633-3545 SLP Time Calculation (min) (ACUTE ONLY): 10 min  Assessment / Plan / Recommendation Clinical Impression  SLP provided skilled observation and Min cues for slower rate during PO intake. Pt remains afebrile with lung sounds mostly clear/diminshed. He denies any subjective complaints, and nursing reports no obvious difficulties. Recommend continuing current diet; SLP to sign off.   HPI HPI: Pt is a 67 y.o. male admitted with AMS, acute respiratory failure initially requiring BiPAP, and elevated troponin. CXR suggestive of interstitial edema; CT head negative. Pt was seen by SLP during recent admission in June 2019, initially NPO after self-extubation but advancing to Dys 2 diet, thin liquids prior to discharge. PMH: CAD s/p CABG, PAD, chronic diastolic CHF, CKD stage IV, COPD on home oxygen, and HTN      SLP Plan  All goals met       Recommendations  Diet recommendations: Dysphagia 2 (fine chop);Thin liquid Liquids provided via: Cup;Straw Medication Administration: Whole meds with puree Supervision: Patient able to self feed;Full supervision/cueing for compensatory strategies Compensations: Slow rate;Small sips/bites;Minimize environmental distractions Postural Changes and/or Swallow Maneuvers: Seated upright 90 degrees                Oral Care Recommendations: Oral care BID Follow up Recommendations: Skilled Nursing facility SLP Visit Diagnosis: Dysphagia, unspecified (R13.10) Plan: All goals met       GO                [name] 09/30/2017, 10:53 AM  [name], M.A. CCC-SLP [phone]

## 2017-09-30 NOTE — Progress Notes (Signed)
Family Medicine Teaching Service Daily Progress Note Intern Pager: [phone]  [name] Medical record number: [id number] Date of birth: 1950-11-12 Age: 67 y.o. Gender: male  Primary Care Provider: Kathrene Alu, MD  Code Status: FULL  Consultations: HF Admission date: 8/21    Pt Overview and Major Events to Date:  8/21 admitted for acute respiratory failure 2/2 CHF exacerbation NSTEMI 8/22 off BiPAP  8/22 off heparin gtt   Assessment and Plan: Leonard [name] is a 67 y.o. male presenting with AMS. PMH is significant for HFpEF, COPD on home oxygen, CAD s/p CABG, PAD, CKD IV, HTN, T2DM, HLD, hypothyroidism.    Acute Hypoxic Respiratory Failure 2/2 CHF exacerbation and NSTEMI s/p CABG Improved, stable on 1.0L per [place] and satting 100%. VSS overnight except he is hypertensive to 162/75 this morning.Patient on PO torsemide 80mg  daily. SCr with mild bump with diuretic change at 1.66>1.84. Heart failure following, appreciate recs.  On exam this morning pt continues to appear euvolemic with clear lungs and no lower extremity edema. He is medically stable for discharge back to SNF facility today, will reach out to CSW to facilitate this. Patient's family has been researching and working with CSW for a SNF placement.  -monitor on tele  - f/u HF recs - po torsemide 80 mg q daily -continue ASA, statin, coreg  - O2 supplement, on home 1.0L per [place]  - continue home  glucopyrrolate BID - duonebs q4prn - continue Spiriva - daily wts, strict I/Os -PRN torsemide for outpatient with weight increase of 2lbs  Asymptomatic bacteriuria  Patient denies urinary symptoms.   Ucx growing >100,000 colonies of Klebsiella pneumonia, confirmed ESBL.  Likely colonization, will hold off on treatment at this time unless symptomatic.    AMS. Resolved. Patient is at baseline. - continue to monitor  Elevated troponin in setting of CAD s/p CABG. Stable, s/p NSTEMI and heparin gtt.  Trop have  peaked at 4.00 without ST changes on EKG and has trended down to 2.93. No longer trending.  Denies chest pain at this time.  - continue ASA, statin, BB, coreg - continue to monitor   HTN. At home on coreg, hydralazine, imdur, doxazosin, clonidine patch. BP 162/75 today - continue home meds - monitor vitals  T2DM. Last A1c 6.9 on 07/05/17. 143 this morning.   - monitor CBGs - sSSI  CKD with anemia, stable.   Hgb 8.4 this AM. - continue home ferrous sulfate  BPH - continue home doxazosin  Hypothyroidism last TSH 3.338 on 07/10/17 - continue home synthroid  FEN/GI: DYS 2, protonix   Prophylaxis: lovenox  Disposition: medically stable for discharge back to SNF facility today. CSW coordinating with family members and facilities to find patient a placement at [facility], [facility], or return to [place].   Subjective: Overnight: no acute events. Patient's family has been working with CSW for new SNF placement. VSS on 1L  Today: patient states he is doing well. He had a normal BM yesterday. He denies any pain or concerns today. He is thirsty this morning.  Objective: Temp:  [98 F (36.7 C)-98.6 F (37 C)] 98.2 F (36.8 C) (08/28 0300) Pulse Rate:  [74-100] 91 (08/28 0300) Resp:  [19-22] 19 (08/28 0300) BP: (112-161)/(53-81) 151/73 (08/28 0400) SpO2:  [97 %-100 %] 98 % (08/28 0400)  Physical Exam: General: 67 yo male, NAD  Cardiovascular: RRR no MRG, 2+ pedal pulses Respiratory: CTAB, no crackles, 2L O2 per   Abdomen: soft, NTND, +  bs  Extremities: right leg well perfused, RLE-no edema,  left leg BKA  Psych: normal mood and affect   Laboratory: Recent Labs  Lab 09/28/17 0310 09/29/17 0319 09/30/17 0316  WBC 4.5 5.0 5.9  HGB 8.7* 8.4* 8.4*  HCT 27.7* 27.1* 27.6*  PLT 137* PLATELET CLUMPS NOTED ON SMEAR, UNABLE TO ESTIMATE 146*   Recent Labs  Lab 09/23/17 0745  09/28/17 0310 09/29/17 0319 09/30/17 0316  NA 146*   < > 138 140 138  K 3.7   < > 4.6 5.0  4.7  CL 111   < > 100 99 97*  CO2 18*   < > 28 30 32  BUN 33*   < > 28* 34* 37*  CREATININE 2.31*   < > 1.66* 1.84* 2.11*  CALCIUM 8.2*   < > 8.6* 9.1 9.1  PROT 5.6*  --   --   --   --   BILITOT 1.4*  --   --   --   --   ALKPHOS 145*  --   --   --   --   ALT 23  --   --   --   --   AST 57*  --   --   --   --   GLUCOSE 227*   < > 135* 122* 171*   < > = values in this interval not displayed.    Imaging/Diagnostic Tests: Head CT- no acute intracranial pathology  Richarda Osmond, DO 09/30/2017, 5:27 AM PGY-1, [place] Intern pager: [phone], text pages welcome

## 2017-10-01 ENCOUNTER — Encounter (HOSPITAL_COMMUNITY): Payer: Self-pay | Admitting: Family Medicine

## 2017-10-01 ENCOUNTER — Telehealth: Payer: Self-pay | Admitting: Family Medicine

## 2017-10-01 DIAGNOSIS — E440 Moderate protein-calorie malnutrition: Secondary | ICD-10-CM

## 2017-10-01 DIAGNOSIS — E1142 Type 2 diabetes mellitus with diabetic polyneuropathy: Secondary | ICD-10-CM

## 2017-10-01 DIAGNOSIS — J449 Chronic obstructive pulmonary disease, unspecified: Secondary | ICD-10-CM

## 2017-10-01 DIAGNOSIS — R1310 Dysphagia, unspecified: Secondary | ICD-10-CM

## 2017-10-01 LAB — BASIC METABOLIC PANEL
ANION GAP: 10 (ref 5–15)
BUN: 39 mg/dL — ABNORMAL HIGH (ref 8–23)
CALCIUM: 8.8 mg/dL — AB (ref 8.9–10.3)
CHLORIDE: 97 mmol/L — AB (ref 98–111)
CO2: 30 mmol/L (ref 22–32)
Creatinine, Ser: 2.06 mg/dL — ABNORMAL HIGH (ref 0.61–1.24)
GFR calc non Af Amer: 32 mL/min — ABNORMAL LOW (ref >60)
GFR, EST AFRICAN AMERICAN: 37 mL/min — AB (ref >60)
Glucose, Bld: 131 mg/dL — ABNORMAL HIGH (ref 70–99)
POTASSIUM: 4.7 mmol/L (ref 3.5–5.1)
Sodium: 137 mmol/L (ref 135–145)

## 2017-10-01 LAB — CBC
HEMATOCRIT: 27.9 % — AB (ref 39.0–52.0)
HEMOGLOBIN: 8.5 g/dL — AB (ref 13.0–17.0)
MCH: 26.8 pg (ref 26.0–34.0)
MCHC: 30.5 g/dL (ref 30.0–36.0)
MCV: 88 fL (ref 78.0–100.0)
Platelets: 173 10*3/uL (ref 150–400)
RBC: 3.17 MIL/uL — AB (ref 4.22–5.81)
RDW: 15.5 % (ref 11.5–15.5)
WBC: 5.1 10*3/uL (ref 4.0–10.5)

## 2017-10-01 LAB — GLUCOSE, CAPILLARY
GLUCOSE-CAPILLARY: 174 mg/dL — AB (ref 70–99)
Glucose-Capillary: 148 mg/dL — ABNORMAL HIGH (ref 70–99)

## 2017-10-01 MED ORDER — TORSEMIDE 20 MG PO TABS: 80.0000 mg | ORAL_TABLET | ORAL | 0 refills | Status: DC

## 2017-10-01 MED ORDER — TORSEMIDE 20 MG PO TABS: 80.0000 mg | ORAL_TABLET | ORAL | Status: DC

## 2017-10-01 MED ORDER — POTASSIUM CHLORIDE CRYS ER 20 MEQ PO TBCR: 40.0000 meq | EXTENDED_RELEASE_TABLET | Freq: Every day | ORAL | 0 refills | Status: DC

## 2017-10-01 MED ORDER — IPRATROPIUM-ALBUTEROL 0.5-2.5 (3) MG/3ML IN SOLN: 3.0000 mL | Freq: Four times a day (QID) | RESPIRATORY_TRACT | Status: DC | PRN

## 2017-10-01 MED ORDER — ISOSORBIDE MONONITRATE ER 120 MG PO TB24: 120.0000 mg | ORAL_TABLET | Freq: Every day | ORAL | 0 refills | Status: AC

## 2017-10-01 NOTE — Progress Notes (Signed)
Family Medicine Teaching Service Daily Progress Note Intern Pager: [phone]  [name] Medical record number: [id number] Date of birth: 14-Jul-1950 Age: 67 y.o. Gender: male  Primary Care Provider: Kathrene Alu, MD  Code Status: FULL  Consultations: HF Admission date: 8/21    Pt Overview and Major Events to Date:  8/21 admitted for acute respiratory failure 2/2 CHF exacerbation NSTEMI 8/22 off BiPAP  8/22 off heparin gtt   Assessment and Plan: Leonard [name] is a 67 y.o. male presenting with AMS. PMH is significant for HFpEF, COPD on home oxygen, CAD s/p CABG, PAD, CKD IV, HTN, T2DM, HLD, hypothyroidism.    Acute Hypoxic Respiratory Failure 2/2 CHF exacerbation and NSTEMI s/p CABG Improved- patient was satting above 97% on 1L oxygen [place] and when I examined him this morning, the [place] was only half in one nostril so I removed the oxygen. He was able to speak in full sentences with no dyspnea. Will continue to monitor ORA. Patient on PO torsemide 80mg  daily. Holding torsemide today for slight increase in Cr. Will recheck today. He is medically stable for discharge back to SNF facility today, will reach out to CSW to facilitate this. -monitor on tele  - f/u HF recs - po torsemide 80 mg q daily, holding today -continue ASA, statin, coreg  - discontinued oxygen today, continue to monitor O2 sats ORA - continue home  glucopyrrolate BID - duonebs q4prn - continue Spiriva - daily wts, strict I/Os -PRN torsemide for outpatient with weight increase of 2lbs  Asymptomatic bacteriuria  Patient denies urinary symptoms.   Ucx growing >100,000 colonies of Klebsiella pneumonia, confirmed ESBL.  Likely colonization, will hold off on treatment at this time unless symptomatic.    AMS. Resolved. Patient is at baseline. - continue to monitor  Elevated troponin in setting of CAD s/p CABG. Stable, s/p NSTEMI and heparin gtt.  Trop have peaked at 4.00 without ST changes on EKG  and has trended down to 2.93. No longer trending.  Denies chest pain at this time.  - continue ASA, statin, BB, coreg - continue to monitor   HTN. At home on coreg, hydralazine, imdur, doxazosin, clonidine patch. BP 131/71 today - continue home meds - monitor vitals  T2DM. Last A1c 6.9 on 07/05/17. 148 this morning.   - monitor CBGs - sSSI  CKD with anemia, stable.   Hgb 8.5 this AM. - continue home ferrous sulfate  BPH - continue home doxazosin  Hypothyroidism last TSH 3.338 on 07/10/17 - continue home synthroid  FEN/GI: DYS 2, protonix   Prophylaxis: lovenox  Disposition: medically stable for discharge back to SNF facility today. CSW coordinating with family members and facilities to find patient a placement at [facility].   Subjective: Overnight: no acute events. Patient's family has been working with CSW for SNF placement at [facility]. VSS on 1L  Today: patient states he is doing well. He denies any pain or concerns today.  Objective: Temp:  [98 F (36.7 C)-98.5 F (36.9 C)] 98 F (36.7 C) (08/28 2340) Pulse Rate:  [68-99] 68 (08/28 2340) Resp:  [16-17] 17 (08/28 2340) BP: (109-163)/(61-80) 126/62 (08/28 2340) SpO2:  [95 %-100 %] 99 % (08/28 2340)  Physical Exam: General: 67 yo male, NAD  Cardiovascular: RRR Respiratory: CTAB, no crackles,  was not in nose on exam- just removed and patient was breathing fine  Abdomen: soft, non tender to palpation, +bs  Extremities: right leg well perfused, RLE-no edema,  left leg BKA  Psych: normal mood and affect   Laboratory: Recent Labs  Lab 09/29/17 0319 09/30/17 0316 10/01/17 0505  WBC 5.0 5.9 5.1  HGB 8.4* 8.4* 8.5*  HCT 27.1* 27.6* 27.9*  PLT PLATELET CLUMPS NOTED ON SMEAR, UNABLE TO ESTIMATE 146* 173   Recent Labs  Lab 09/28/17 0310 09/29/17 0319 09/30/17 0316  NA 138 140 138  K 4.6 5.0 4.7  CL 100 99 97*  CO2 28 30 32  BUN 28* 34* 37*  CREATININE 1.66* 1.84* 2.11*  CALCIUM 8.6* 9.1 9.1   GLUCOSE 135* 122* 171*    Imaging/Diagnostic Tests: Head CT- no acute intracranial pathology  Leonard Osmond, DO 10/01/2017, 5:57 AM PGY-1, [place] Intern pager: [phone], text pages welcome

## 2017-10-01 NOTE — Progress Notes (Signed)
Nutrition Follow Up  DOCUMENTATION CODES:   Not applicable  INTERVENTION:    Ensure Enlive po BID, each supplement provides 350 kcal and 20 grams of protein  NUTRITION DIAGNOSIS:   Increased nutrient needs related to chronic illness, wound healing as evidenced by estimated needs, ongoing  GOAL:   Patient will meet greater than or equal to 90% of their needs, progressing  MONITOR:   PO intake, Supplement acceptance, Labs, Skin, Weight trends, I & O's  ASSESSMENT:   67 yo Male with PMH of CAD s/p CABG, CKD Stage IV, PAD s/p AKA, and type II diabetes; presented with AMS and respiratory distress, likely due to exacerbation of his significant heart failure.   RD spoke with patient at bedside. He states his appetite is fair. PO intake at 25% per flowsheet records. Ensure Enlive supplement present on his tray table. He reports he is drinking.  CWOCN note 8/23 reviewed. Skin breakdown is not a pressure injury. Labs and medications reviewed. BUN 39 (H). Cr 2.06 (H). CBG's [phone].  Palliative Medicine Team note reviewed.  Wants full scope treatment.  Diet Order:   Diet Order            DIET DYS 2 Room service appropriate? Yes; Fluid consistency: Thin  Diet effective now             EDUCATION NEEDS:   No education needs have been identified at this time  Skin:  Skin Assessment: Skin Integrity Issues: Skin Integrity Issues:: Other (Comment) Other: MASD to bilat upper buttocks and sacrum  Last BM:  8/28  Height:   Ht Readings from Last 1 Encounters:  09/23/17 5\' 8"  (1.727 m)   Weight:   Wt Readings from Last 1 Encounters:  10/01/17 72.5 kg   BMI:  27.1 kg/m2 >> adjusted for AKA  Estimated Nutritional Needs:   Kcal:  2100-2300  Protein:  100-115 gm  Fluid:  2.1-2.3 L/day  Arthur Holms, RD, LDN Pager #: [phone] After-Hours Pager #: [phone]

## 2017-10-01 NOTE — Progress Notes (Addendum)
   Report called to [place] at [facility].   Awaiting PTAR.

## 2017-10-01 NOTE — Care Management Important Message (Signed)
Important Message  Patient Details  Name: Leonard [name] MRN: [id number] Date of Birth: 1950-11-27   Medicare Important Message Given:  Yes    Zenon Mayo, RN 10/01/2017, 4:21 PM

## 2017-10-01 NOTE — Progress Notes (Signed)
Patient is set to discharge back to [facility] at [facility] today. Patient & daughter, [name], aware. Discharge packet given to RN,Bill. PTAR called for transport. Please call report to [place], [place] Worker [phone]

## 2017-10-01 NOTE — Telephone Encounter (Signed)
Gillis Santa the pt's sister is calling and would like to talk to someone concerning his discharge from the hospital. He is still there but she has questions about some things listed on his discharge paperwork. The best number to contact her is [phone].

## 2017-10-01 NOTE — Telephone Encounter (Signed)
Will let inpatient team know to call his sister since they have been taking care of him while he's been in the hospital.

## 2017-10-01 NOTE — Discharge Instructions (Signed)
You were treated for congestive heart failure that lead to difficulty breathing. You will continue to be treated for this outpatient with medications you were prescribed while admitted. Please follow up with your heart doctor and PCP.

## 2017-10-01 NOTE — Progress Notes (Signed)
Advanced Heart Failure Clinic Note  PCP: Dr. Juanito Doom Cardiology: Dr. Aundra Dubin Nephrology: Dr Marval Regal.   Leonard [name] is a 67 y.o. male with history of CAD s/p CABG, PAD, chronic diastolic CHF, CKD stage IV, COPD on home oxygen, and HTN presents for cardiology followup.  He has had multiple admissions this year for diastolic CHF.  Last echo in 5/17 showed EF 60-65% with grade II diastolic dysfunction.  He also has COPD with PFTs showing severe obstruction.    Admitted 7/23-7/28/19 with A/C HF. Initial poor response to IV lasix with worsening creatinine. HF team consulted. Diuresed 20 lbs with high dose lasix and metolazone. AKI resolved prior to discharge. Transitioned to torsemide 40 mg daily (previously on 20 mg daily). Chest CT showed large right pleural effusion. Did not get thoracentesis. He was also started on aranesp for anemia. DC weight: 164 lbs. DCd to SNF.   Admitted 8/21-8/29/19 with A/C HF. He was initially hypoxic with AMS, but improved with BiPAP and diuresis. Diuresed with IV lasix. He was initially transitioned to torsemide 80 mg daily, but had mild AKI, so was decreased to torsemide 80 mg every other day He had positive troponins (peaked at 4). Myoview showed no ischemia. Palliative consulted and he remained full code. He received blood for anemia with no obvious source of bleeding. DC weight: 159 lbs. DCd to [facility].  He presents today for post hospital follow up. Overall doing fine. Denies SOB, but is not very active. Denies orthopnea, PND, or edema. He has a productive cough with yellow sputum. No fever or chills. Energy and appetite are good. He is doing a much better job limiting his fluid intake. Meals and meds through SNF. He has not been getting weighed.   Labs (8/17): K 3.9, creatinine 2.33, HCT 28.5, LDL 46 Labs (11/17): hgb 9, K 4.4, creatinine 3, BNP 64 Labs 03/24/2016: K 3.5 Creatinine 2.48.  Labs 05/09/2016: K 4.3 Creatinine 3.13 Hgb 9.0   PMH: 1. Chronic  diastolic CHF: Multiple recent admissions.  Echo (5/17) with EF 60-65%, grade II diastolic dysfunction, normal RV size and systolic function.  2. PAD: Right femoral PCI in 12/14. Peripheral arterial dopplers in 6/17 with occluded right SFA.  He follows with [facility].  3. HTN: Renal artery dopplers negative in 5/17.  4. Hypothyroidism 5. CKD stage IV: Follows with Dr. Marval Regal. 6. CAD: s/p CABG x 3 in 3/15.  7. OSA: Mild, not on CPAP.  8. COPD: PFTs (3/15) with FVC 77%, FEV1 47%, ratio 59%, TLC 55% => severe obstruction.  Prior smoker. He is on 2 L home O2.  9. 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 Hyperlipidemia 12. Anemia of renal disease  Review of systems complete and found to be negative unless listed in HPI.   Social History   Socioeconomic History  . Marital status: Single    Spouse name: Not on file  . Number of children: Not on file  . Years of education: Not on file  . Highest education level: Not on file  Occupational History  . Not on file  Social Needs  . Financial resource strain: Not on file  . Food insecurity:    Worry: Not on file    Inability: Not on file  . Transportation needs:    Medical: Not on file    Non-medical: Not on file  Tobacco Use  . Smoking status: Former Smoker    Packs/day: 1.00    Years: 46.00    Pack years: 46.00  Types: Cigarettes    Start date: 02/04/1967    Last attempt to quit: 02/03/2013    Years since quitting: 4.6  . Smokeless tobacco: Never Used  Substance and Sexual Activity  . Alcohol use: No    Alcohol/week: 0.0 standard drinks  . Drug use: No  . Sexual activity: Not Currently  Lifestyle  . Physical activity:    Days per week: Not on file    Minutes per session: Not on file  . Stress: Not on file  Relationships  . Social connections:    Talks on phone: Not on file    Gets together: Not on file    Attends religious service: Not on file    Active member of club or organization: Not on file    Attends meetings of clubs or  organizations: Not on file    Relationship status: Not on file  . Intimate partner violence:    Fear of current or ex partner: Not on file    Emotionally abused: Not on file    Physically abused: Not on file    Forced sexual activity: Not on file  Other Topics Concern  . Not on file  Social History Narrative   Lives with sister, Leonard [name] in [place].  Retired - former Sports coach.   Patient in skilled nursing facility   Family History  Problem Relation Age of Onset  . Cancer Mother        lung cancer  . Hypertension Mother   . Cancer Sister        patient thinks it was uterine cancer  . Hypertension Sister   . Heart attack Sister   . Stroke Neg Hx     Current Outpatient Medications  Medication Sig Dispense Refill  . acetaminophen (TYLENOL) 325 MG tablet Take 650 mg by mouth every 8 (eight) hours as needed for mild pain or fever.     Marland Kitchen aspirin EC 81 MG tablet Take 1 tablet (81 mg total) by mouth daily. 150 tablet 0  . atorvastatin (LIPITOR) 40 MG tablet TAKE ONE (1) TABLET BY MOUTH EVERY DAY AT 6:00PM (Patient taking differently: Take 40 mg by mouth daily at 6 PM. ) 90 tablet 3  . Blood Glucose Monitoring Suppl (ONETOUCH VERIO FLEX SYSTEM) w/Device KIT 1 kit by Does not apply route daily. 1 kit 3  . carvedilol (COREG) 12.5 MG tablet Take 1 tablet (12.5 mg total) by mouth 2 (two) times daily with a meal. 60 tablet 0  . cloNIDine (CATAPRES - DOSED IN MG/24 HR) 0.2 mg/24hr patch Place 1 patch (0.2 mg total) onto the skin once a week. (Patient taking differently: Place 0.2 mg onto the skin every Monday. ) 4 patch 0  . Darbepoetin Alfa (ARANESP) 40 MCG/0.4ML SOSY injection Inject 0.4 mLs (40 mcg total) into the skin every 30 (thirty) days. 8.4 mL 2  . doxazosin (CARDURA) 8 MG tablet Take 1 tablet (8 mg total) by mouth every evening.    . feeding supplement, ENSURE ENLIVE, (ENSURE ENLIVE) LIQD Take 237 mLs by mouth 3 (three) times daily between meals. 237 mL 12  . ferrous sulfate  (FEROSUL) 325 (65 FE) MG tablet Take 1 tablet (325 mg total) by mouth daily at 12 noon. 30 tablet 0  . glucose blood (ONETOUCH VERIO) test strip Use as instructed to test three times daily. ICD-10 code: E11.22 100 each 12  . hydrALAZINE (APRESOLINE) 100 MG tablet TAKE ONE TABLET BY MOUTH EVERY EIGHT HOURS (Patient taking differently:  Take 100 mg by mouth every 8 (eight) hours. ) 90 tablet 1  . ipratropium-albuterol (DUONEB) 0.5-2.5 (3) MG/3ML SOLN Take 3 mLs by nebulization 4 (four) times daily as needed (shortness of breath and wheezing).     . isosorbide mononitrate (IMDUR) 120 MG 24 hr tablet Take 1 tablet (120 mg total) by mouth daily. 30 tablet 0  . levothyroxine (SYNTHROID, LEVOTHROID) 100 MCG tablet Take 100 mcg by mouth daily before breakfast.     . LONHALA MAGNAIR STARTER KIT 25 MCG/ML SOLN Inhale 1 mL into the lungs 2 (two) times daily.    . nitroGLYCERIN (NITROSTAT) 0.4 MG SL tablet Place 1 tablet (0.4 mg total) under the tongue every 5 (five) minutes as needed for chest pain. 30 tablet 12  . ONETOUCH DELICA LANCETS 46N MISC 1 each by Does not apply route 3 (three) times daily. ICD-10 code: E11.22 100 each 12  . pantoprazole (PROTONIX) 40 MG tablet Take 40 mg by mouth daily.    . polyethylene glycol (MIRALAX / GLYCOLAX) packet Take 17 g by mouth daily as needed for mild constipation or moderate constipation. (Patient taking differently: Take 17 g by mouth every 8 (eight) hours as needed for mild constipation. ) 14 each 0  . potassium chloride SA (K-DUR,KLOR-CON) 20 MEQ tablet Take 40 mEq by mouth daily.    Marland Kitchen torsemide (DEMADEX) 20 MG tablet Take 4 tablets (80 mg total) by mouth every other day. 15 tablet 0   No current facility-administered medications for this encounter.    Facility-Administered Medications Ordered in Other Encounters  Medication Dose Route Frequency Provider Last Rate Last Dose  . sodium chloride flush (NS) 0.9 % injection 10 mL  10 mL Intracatheter PRN Alvy Bimler, Ni, MD          BP (!) 142/78   Pulse 72   Wt 70.5 kg (155 lb 6.4 oz)   SpO2 98%   BMI 23.63 kg/m    Filed Weights   10/06/17 0951  Weight: 70.5 kg (155 lb 6.4 oz)    Wt Readings from Last 3 Encounters:  10/06/17 70.5 kg (155 lb 6.4 oz)  10/01/17 72.5 kg (159 lb 13.3 oz)  09/15/17 73 kg (161 lb)   General: No resp difficulty. Arrived in wheelchair.  HEENT: Normal Neck: Supple. JVP ~10. Carotids 2+ bilat; no bruits. No thyromegaly or nodule noted. Cor: PMI nondisplaced. RRR, No M/G/R noted Lungs: diminished in bases Abdomen: Soft, non-tender, non-distended, no HSM. No bruits or masses. +BS  Extremities: No cyanosis, clubbing, or rash. Left AKA. RLE no edema.  Neuro: Alert & orientedx3, cranial nerves grossly intact. moves all 4 extremities w/o difficulty. Affect pleasant   Assessment/Plan: 1. Chronic diastolic CHF:  - Echo 07/2950: EF 65-70%, grade 2 DD, mild TR, PA peak pressure 46 mmHg - Echo 09/2017: EF 50-55%, LA dilated, RV normal - NYHA II, but not very active - Volume status trending back up. ReDS vest 40%. - Change torsemide from 80 mg every other day back to torsemide 60 mg daily - Not on spiro with CKD. - Reinforced dietary restrictions and fluid restrictions.  - Continue TED hose to RLE - Start daily weights at [place]. He can stand with assistance. Instructed them to contact us for weight gain >2 lbs in 24 hours or > 5 lbs in 7 days.   2. HTN:  - On hydralazine 100 mg TID, imdur 120 mg daily, cardura 8 mg daily, coreg 12.5 mg BID, and clonidine patch 0.2 mg weekly.  -  Elevated today. Increase torsemide as above.  3. CAD s.p CABG - No s/s ischemia - Continue ASA, statin and Coreg.  - Elevated troponin (peak 3.98) during recent admission. Myoview 09/25/17: EF 45%, no ischemia     4. COPD: Gold stage III   - Severe obstruction on prior PFTs. No longer on O2 at home. O2 sats 98% today.  - Follows with Dr. Melvyn Novas. Weaned off O2 while inpatient. Encouraged him to  follow up with pulmonary.    5. CKD: Stage V.-  L AVF. Follows with Dr Marval Regal.  - Baseline creatinine unclear - lots of variation - Check BMET today  6. Hx of DVT - Diagnosed 08/30/16 - No longer on AC.  No change.   7. Hx of pleural effusions R>L  8. Extensive PAD - s/p L AKA for extensive LLE wounds with no re-vascularization options in 03/2017 by Dr. Donzetta Matters. No change.   9. Anemia - Received blood while admitted. Denies bleeding.  10. Pulmonary HTN - PA peak pressure 63 mmHg on Echo 07/20/17 - Suspect primarily WHO Group III and WHO Group II pulmonary HTN. Suspect he has mild/modPulm Hypertensionat baselineand pressures wereelevated in the setting of volume overload. No change.   11. Deconditioning - Remains at [place]. PT/OT as tolerated  BMET today  Change torsemide back to 60 mg daily BMET in 1 week at [place] Follow up in 2 weeks  Georgiana Shore, NP 10/06/2017   Greater than 50% of the 25 minute visit was spent in counseling/coordination of care regarding disease state education, salt/fluid restriction, sliding scale diuretics, and medication compliance.

## 2017-10-02 DIAGNOSIS — F4320 Adjustment disorder, unspecified: Secondary | ICD-10-CM | POA: Diagnosis not present

## 2017-10-03 LAB — GLUCOSE, CAPILLARY: GLUCOSE-CAPILLARY: 202 mg/dL — AB (ref 70–99)

## 2017-10-04 DIAGNOSIS — K219 Gastro-esophageal reflux disease without esophagitis: Secondary | ICD-10-CM | POA: Diagnosis not present

## 2017-10-04 DIAGNOSIS — R1312 Dysphagia, oropharyngeal phase: Secondary | ICD-10-CM | POA: Diagnosis not present

## 2017-10-06 ENCOUNTER — Encounter (HOSPITAL_COMMUNITY): Payer: Self-pay

## 2017-10-06 ENCOUNTER — Ambulatory Visit (HOSPITAL_COMMUNITY)
Admission: RE | Admit: 2017-10-06 | Discharge: 2017-10-06 | Disposition: A | Discharge status: Home / Self Care | Payer: Medicare Other | Source: Ambulatory Visit | Attending: Cardiology | Admitting: Cardiology

## 2017-10-06 VITALS — BP 142/78 | HR 72 | Wt 155.4 lb

## 2017-10-06 DIAGNOSIS — Z9981 Dependence on supplemental oxygen: Secondary | ICD-10-CM | POA: Diagnosis not present

## 2017-10-06 DIAGNOSIS — J90 Pleural effusion, not elsewhere classified: Secondary | ICD-10-CM | POA: Insufficient documentation

## 2017-10-06 DIAGNOSIS — Z951 Presence of aortocoronary bypass graft: Secondary | ICD-10-CM | POA: Diagnosis not present

## 2017-10-06 DIAGNOSIS — N184 Chronic kidney disease, stage 4 (severe): Secondary | ICD-10-CM

## 2017-10-06 DIAGNOSIS — I132 Hypertensive heart and chronic kidney disease with heart failure and with stage 5 chronic kidney disease, or end stage renal disease: Secondary | ICD-10-CM | POA: Insufficient documentation

## 2017-10-06 DIAGNOSIS — I5032 Chronic diastolic (congestive) heart failure: Secondary | ICD-10-CM | POA: Diagnosis not present

## 2017-10-06 DIAGNOSIS — E785 Hyperlipidemia, unspecified: Secondary | ICD-10-CM | POA: Insufficient documentation

## 2017-10-06 DIAGNOSIS — E1122 Type 2 diabetes mellitus with diabetic chronic kidney disease: Secondary | ICD-10-CM | POA: Insufficient documentation

## 2017-10-06 DIAGNOSIS — Z09 Encounter for follow-up examination after completed treatment for conditions other than malignant neoplasm: Secondary | ICD-10-CM | POA: Insufficient documentation

## 2017-10-06 DIAGNOSIS — D649 Anemia, unspecified: Secondary | ICD-10-CM | POA: Insufficient documentation

## 2017-10-06 DIAGNOSIS — G4733 Obstructive sleep apnea (adult) (pediatric): Secondary | ICD-10-CM | POA: Diagnosis not present

## 2017-10-06 DIAGNOSIS — I2510 Atherosclerotic heart disease of native coronary artery without angina pectoris: Secondary | ICD-10-CM | POA: Diagnosis not present

## 2017-10-06 DIAGNOSIS — J449 Chronic obstructive pulmonary disease, unspecified: Secondary | ICD-10-CM

## 2017-10-06 DIAGNOSIS — Z86718 Personal history of other venous thrombosis and embolism: Secondary | ICD-10-CM | POA: Insufficient documentation

## 2017-10-06 DIAGNOSIS — Z7982 Long term (current) use of aspirin: Secondary | ICD-10-CM | POA: Diagnosis not present

## 2017-10-06 DIAGNOSIS — Z823 Family history of stroke: Secondary | ICD-10-CM | POA: Diagnosis not present

## 2017-10-06 DIAGNOSIS — Z8249 Family history of ischemic heart disease and other diseases of the circulatory system: Secondary | ICD-10-CM | POA: Diagnosis not present

## 2017-10-06 DIAGNOSIS — E039 Hypothyroidism, unspecified: Secondary | ICD-10-CM | POA: Diagnosis not present

## 2017-10-06 DIAGNOSIS — Z89612 Acquired absence of left leg above knee: Secondary | ICD-10-CM | POA: Insufficient documentation

## 2017-10-06 DIAGNOSIS — Z87891 Personal history of nicotine dependence: Secondary | ICD-10-CM | POA: Diagnosis not present

## 2017-10-06 DIAGNOSIS — N185 Chronic kidney disease, stage 5: Secondary | ICD-10-CM | POA: Diagnosis not present

## 2017-10-06 DIAGNOSIS — I10 Essential (primary) hypertension: Secondary | ICD-10-CM

## 2017-10-06 DIAGNOSIS — D500 Iron deficiency anemia secondary to blood loss (chronic): Secondary | ICD-10-CM | POA: Diagnosis not present

## 2017-10-06 DIAGNOSIS — R1312 Dysphagia, oropharyngeal phase: Secondary | ICD-10-CM | POA: Diagnosis not present

## 2017-10-06 DIAGNOSIS — K219 Gastro-esophageal reflux disease without esophagitis: Secondary | ICD-10-CM | POA: Diagnosis not present

## 2017-10-06 DIAGNOSIS — Z79899 Other long term (current) drug therapy: Secondary | ICD-10-CM | POA: Insufficient documentation

## 2017-10-06 DIAGNOSIS — Z79890 Hormone replacement therapy (postmenopausal): Secondary | ICD-10-CM | POA: Insufficient documentation

## 2017-10-06 DIAGNOSIS — F0150 Vascular dementia without behavioral disturbance: Secondary | ICD-10-CM | POA: Diagnosis not present

## 2017-10-06 DIAGNOSIS — Z801 Family history of malignant neoplasm of trachea, bronchus and lung: Secondary | ICD-10-CM | POA: Insufficient documentation

## 2017-10-06 LAB — BASIC METABOLIC PANEL
Anion gap: 9 (ref 5–15)
BUN: 42 mg/dL — ABNORMAL HIGH (ref 8–23)
CHLORIDE: 105 mmol/L (ref 98–111)
CO2: 24 mmol/L (ref 22–32)
Calcium: 9.1 mg/dL (ref 8.9–10.3)
Creatinine, Ser: 2.18 mg/dL — ABNORMAL HIGH (ref 0.61–1.24)
GFR calc non Af Amer: 30 mL/min — ABNORMAL LOW (ref >60)
GFR, EST AFRICAN AMERICAN: 35 mL/min — AB (ref >60)
Glucose, Bld: 195 mg/dL — ABNORMAL HIGH (ref 70–99)
POTASSIUM: 5 mmol/L (ref 3.5–5.1)
SODIUM: 138 mmol/L (ref 135–145)

## 2017-10-06 MED ORDER — TORSEMIDE 20 MG PO TABS: 60.0000 mg | ORAL_TABLET | Freq: Every day | ORAL | 3 refills | Status: DC

## 2017-10-06 NOTE — Patient Instructions (Signed)
Labs today (will call for abnormal results, otherwise no news is good news)  INCREASE Torsemide to 60 mg Daily  Labs in 1 week(bmet)  Follow up in 2 weeks.

## 2017-10-07 DIAGNOSIS — K219 Gastro-esophageal reflux disease without esophagitis: Secondary | ICD-10-CM | POA: Diagnosis not present

## 2017-10-07 DIAGNOSIS — R1312 Dysphagia, oropharyngeal phase: Secondary | ICD-10-CM | POA: Diagnosis not present

## 2017-10-07 DIAGNOSIS — E039 Hypothyroidism, unspecified: Secondary | ICD-10-CM | POA: Diagnosis not present

## 2017-10-07 DIAGNOSIS — I739 Peripheral vascular disease, unspecified: Secondary | ICD-10-CM | POA: Diagnosis not present

## 2017-10-07 DIAGNOSIS — I5032 Chronic diastolic (congestive) heart failure: Secondary | ICD-10-CM | POA: Diagnosis not present

## 2017-10-07 DIAGNOSIS — I150 Renovascular hypertension: Secondary | ICD-10-CM | POA: Diagnosis not present

## 2017-10-08 DIAGNOSIS — K219 Gastro-esophageal reflux disease without esophagitis: Secondary | ICD-10-CM | POA: Diagnosis not present

## 2017-10-08 DIAGNOSIS — R1312 Dysphagia, oropharyngeal phase: Secondary | ICD-10-CM | POA: Diagnosis not present

## 2017-10-09 DIAGNOSIS — K219 Gastro-esophageal reflux disease without esophagitis: Secondary | ICD-10-CM | POA: Diagnosis not present

## 2017-10-09 DIAGNOSIS — R1312 Dysphagia, oropharyngeal phase: Secondary | ICD-10-CM | POA: Diagnosis not present

## 2017-10-11 DIAGNOSIS — R1312 Dysphagia, oropharyngeal phase: Secondary | ICD-10-CM | POA: Diagnosis not present

## 2017-10-11 DIAGNOSIS — K219 Gastro-esophageal reflux disease without esophagitis: Secondary | ICD-10-CM | POA: Diagnosis not present

## 2017-10-12 DIAGNOSIS — E0829 Diabetes mellitus due to underlying condition with other diabetic kidney complication: Secondary | ICD-10-CM | POA: Diagnosis not present

## 2017-10-12 DIAGNOSIS — K219 Gastro-esophageal reflux disease without esophagitis: Secondary | ICD-10-CM | POA: Diagnosis not present

## 2017-10-12 DIAGNOSIS — D649 Anemia, unspecified: Secondary | ICD-10-CM | POA: Diagnosis not present

## 2017-10-12 DIAGNOSIS — I509 Heart failure, unspecified: Secondary | ICD-10-CM | POA: Diagnosis not present

## 2017-10-12 DIAGNOSIS — R1312 Dysphagia, oropharyngeal phase: Secondary | ICD-10-CM | POA: Diagnosis not present

## 2017-10-12 DIAGNOSIS — I10 Essential (primary) hypertension: Secondary | ICD-10-CM | POA: Diagnosis not present

## 2017-10-13 DIAGNOSIS — K219 Gastro-esophageal reflux disease without esophagitis: Secondary | ICD-10-CM | POA: Diagnosis not present

## 2017-10-13 DIAGNOSIS — R1312 Dysphagia, oropharyngeal phase: Secondary | ICD-10-CM | POA: Diagnosis not present

## 2017-10-13 DIAGNOSIS — D649 Anemia, unspecified: Secondary | ICD-10-CM | POA: Diagnosis not present

## 2017-10-13 DIAGNOSIS — Z79899 Other long term (current) drug therapy: Secondary | ICD-10-CM | POA: Diagnosis not present

## 2017-10-14 DIAGNOSIS — K219 Gastro-esophageal reflux disease without esophagitis: Secondary | ICD-10-CM | POA: Diagnosis not present

## 2017-10-14 DIAGNOSIS — R1312 Dysphagia, oropharyngeal phase: Secondary | ICD-10-CM | POA: Diagnosis not present

## 2017-10-15 DIAGNOSIS — R1312 Dysphagia, oropharyngeal phase: Secondary | ICD-10-CM | POA: Diagnosis not present

## 2017-10-15 DIAGNOSIS — K219 Gastro-esophageal reflux disease without esophagitis: Secondary | ICD-10-CM | POA: Diagnosis not present

## 2017-10-19 DIAGNOSIS — K219 Gastro-esophageal reflux disease without esophagitis: Secondary | ICD-10-CM | POA: Diagnosis not present

## 2017-10-19 DIAGNOSIS — R1312 Dysphagia, oropharyngeal phase: Secondary | ICD-10-CM | POA: Diagnosis not present

## 2017-10-20 ENCOUNTER — Ambulatory Visit (HOSPITAL_COMMUNITY)
Admission: RE | Admit: 2017-10-20 | Discharge: 2017-10-20 | Disposition: A | Discharge status: Home / Self Care | Payer: Medicare Other | Source: Ambulatory Visit | Attending: Internal Medicine | Admitting: Internal Medicine

## 2017-10-20 ENCOUNTER — Encounter (HOSPITAL_COMMUNITY): Payer: Self-pay

## 2017-10-20 VITALS — BP 124/64 | HR 82 | Wt 156.0 lb

## 2017-10-20 DIAGNOSIS — J449 Chronic obstructive pulmonary disease, unspecified: Secondary | ICD-10-CM | POA: Insufficient documentation

## 2017-10-20 DIAGNOSIS — E1122 Type 2 diabetes mellitus with diabetic chronic kidney disease: Secondary | ICD-10-CM | POA: Insufficient documentation

## 2017-10-20 DIAGNOSIS — R5381 Other malaise: Secondary | ICD-10-CM

## 2017-10-20 DIAGNOSIS — I10 Essential (primary) hypertension: Secondary | ICD-10-CM | POA: Diagnosis not present

## 2017-10-20 DIAGNOSIS — Z87891 Personal history of nicotine dependence: Secondary | ICD-10-CM | POA: Insufficient documentation

## 2017-10-20 DIAGNOSIS — E1151 Type 2 diabetes mellitus with diabetic peripheral angiopathy without gangrene: Secondary | ICD-10-CM | POA: Insufficient documentation

## 2017-10-20 DIAGNOSIS — Z89612 Acquired absence of left leg above knee: Secondary | ICD-10-CM | POA: Diagnosis not present

## 2017-10-20 DIAGNOSIS — E039 Hypothyroidism, unspecified: Secondary | ICD-10-CM | POA: Diagnosis not present

## 2017-10-20 DIAGNOSIS — I5032 Chronic diastolic (congestive) heart failure: Secondary | ICD-10-CM | POA: Diagnosis not present

## 2017-10-20 DIAGNOSIS — I2510 Atherosclerotic heart disease of native coronary artery without angina pectoris: Secondary | ICD-10-CM | POA: Diagnosis not present

## 2017-10-20 DIAGNOSIS — I5033 Acute on chronic diastolic (congestive) heart failure: Secondary | ICD-10-CM | POA: Diagnosis not present

## 2017-10-20 DIAGNOSIS — Z79890 Hormone replacement therapy (postmenopausal): Secondary | ICD-10-CM | POA: Insufficient documentation

## 2017-10-20 DIAGNOSIS — Z9981 Dependence on supplemental oxygen: Secondary | ICD-10-CM | POA: Diagnosis not present

## 2017-10-20 DIAGNOSIS — Z8249 Family history of ischemic heart disease and other diseases of the circulatory system: Secondary | ICD-10-CM | POA: Insufficient documentation

## 2017-10-20 DIAGNOSIS — Z79899 Other long term (current) drug therapy: Secondary | ICD-10-CM | POA: Insufficient documentation

## 2017-10-20 DIAGNOSIS — Z951 Presence of aortocoronary bypass graft: Secondary | ICD-10-CM | POA: Insufficient documentation

## 2017-10-20 DIAGNOSIS — G4733 Obstructive sleep apnea (adult) (pediatric): Secondary | ICD-10-CM | POA: Diagnosis not present

## 2017-10-20 DIAGNOSIS — N184 Chronic kidney disease, stage 4 (severe): Secondary | ICD-10-CM | POA: Diagnosis not present

## 2017-10-20 DIAGNOSIS — Z86718 Personal history of other venous thrombosis and embolism: Secondary | ICD-10-CM | POA: Diagnosis not present

## 2017-10-20 DIAGNOSIS — D649 Anemia, unspecified: Secondary | ICD-10-CM | POA: Diagnosis not present

## 2017-10-20 DIAGNOSIS — Z7982 Long term (current) use of aspirin: Secondary | ICD-10-CM | POA: Insufficient documentation

## 2017-10-20 DIAGNOSIS — N185 Chronic kidney disease, stage 5: Secondary | ICD-10-CM | POA: Diagnosis not present

## 2017-10-20 DIAGNOSIS — E785 Hyperlipidemia, unspecified: Secondary | ICD-10-CM | POA: Diagnosis not present

## 2017-10-20 DIAGNOSIS — I132 Hypertensive heart and chronic kidney disease with heart failure and with stage 5 chronic kidney disease, or end stage renal disease: Secondary | ICD-10-CM | POA: Insufficient documentation

## 2017-10-20 LAB — BASIC METABOLIC PANEL
Anion gap: 12 (ref 5–15)
BUN: 42 mg/dL — ABNORMAL HIGH (ref 8–23)
CHLORIDE: 103 mmol/L (ref 98–111)
CO2: 23 mmol/L (ref 22–32)
Calcium: 9.2 mg/dL (ref 8.9–10.3)
Creatinine, Ser: 2.1 mg/dL — ABNORMAL HIGH (ref 0.61–1.24)
GFR calc Af Amer: 36 mL/min — ABNORMAL LOW (ref >60)
GFR, EST NON AFRICAN AMERICAN: 31 mL/min — AB (ref >60)
Glucose, Bld: 192 mg/dL — ABNORMAL HIGH (ref 70–99)
POTASSIUM: 5.4 mmol/L — AB (ref 3.5–5.1)
SODIUM: 138 mmol/L (ref 135–145)

## 2017-10-20 LAB — BRAIN NATRIURETIC PEPTIDE: B Natriuretic Peptide: 176.2 pg/mL — ABNORMAL HIGH (ref 0.0–100.0)

## 2017-10-20 NOTE — Progress Notes (Signed)
Advanced Heart Failure Clinic Note  PCP: Dr. Juanito Doom Cardiology: Dr. Aundra Dubin Nephrology: Dr Marval Regal.   Leonard [name] is a 67 y.o. male with history of CAD s/p CABG, PAD, chronic diastolic CHF, CKD stage IV, COPD on home oxygen, and HTN presents for cardiology followup.  He has had multiple admissions this year for diastolic CHF.  Last echo in 5/17 showed EF 60-65% with grade II diastolic dysfunction.  He also has COPD with PFTs showing severe obstruction.    Admitted 7/23-7/28/19 with A/C HF. Initial poor response to IV lasix with worsening creatinine. HF team consulted. Diuresed 20 lbs with high dose lasix and metolazone. AKI resolved prior to discharge. Transitioned to torsemide 40 mg daily (previously on 20 mg daily). Chest CT showed large right pleural effusion. Did not get thoracentesis. He was also started on aranesp for anemia. DC weight: 164 lbs. DCd to SNF.   Admitted 8/21-8/29/19 with A/C HF. He was initially hypoxic with AMS, but improved with BiPAP and diuresis. Diuresed with IV lasix. He was initially transitioned to torsemide 80 mg daily, but had mild AKI, so was decreased to torsemide 80 mg every other day He had positive troponins (peaked at 4). Myoview showed no ischemia. Palliative consulted and he remained full code. He received blood for anemia with no obvious source of bleeding. DC weight: 159 lbs. DCd to [facility].  He presents today for regular of follow up. Feels about the same since last visit. He notes improved UOP . Cough improved. No fevers or chills. Energy and appetite are stable. Watching fluid and salt, though meals and meds provided through SNF. Denies peripheral edema. Continues to work with PT. Their goal for him is independent in toileting. No complaints today.   Labs (8/17): K 3.9, creatinine 2.33, HCT 28.5, LDL 46 Labs (11/17): hgb 9, K 4.4, creatinine 3, BNP 64 Labs 03/24/2016: K 3.5 Creatinine 2.48.  Labs 05/09/2016: K 4.3 Creatinine 3.13 Hgb 9.0    PMH: 1. Chronic diastolic CHF: Multiple recent admissions.  Echo (5/17) with EF 60-65%, grade II diastolic dysfunction, normal RV size and systolic function.  2. PAD: Right femoral PCI in 12/14. Peripheral arterial dopplers in 6/17 with occluded right SFA.  He follows with [facility].  3. HTN: Renal artery dopplers negative in 5/17.  4. Hypothyroidism 5. CKD stage IV: Follows with Dr. Marval Regal. 6. CAD: s/p CABG x 3 in 3/15.  7. OSA: Mild, not on CPAP.  8. COPD: PFTs (3/15) with FVC 77%, FEV1 47%, ratio 59%, TLC 55% => severe obstruction.  Prior smoker. He is on 2 L home O2.  9. 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 Hyperlipidemia 12. Anemia of renal disease  Review of systems complete and found to be negative unless listed in HPI.    Social History   Socioeconomic History  . Marital status: Single    Spouse name: Not on file  . Number of children: Not on file  . Years of education: Not on file  . Highest education level: Not on file  Occupational History  . Not on file  Social Needs  . Financial resource strain: Not on file  . Food insecurity:    Worry: Not on file    Inability: Not on file  . Transportation needs:    Medical: Not on file    Non-medical: Not on file  Tobacco Use  . Smoking status: Former Smoker    Packs/day: 1.00    Years: 46.00    Pack years: 46.00  Types: Cigarettes    Start date: 02/04/1967    Last attempt to quit: 02/03/2013    Years since quitting: 4.7  . Smokeless tobacco: Never Used  Substance and Sexual Activity  . Alcohol use: No    Alcohol/week: 0.0 standard drinks  . Drug use: No  . Sexual activity: Not Currently  Lifestyle  . Physical activity:    Days per week: Not on file    Minutes per session: Not on file  . Stress: Not on file  Relationships  . Social connections:    Talks on phone: Not on file    Gets together: Not on file    Attends religious service: Not on file    Active member of club or organization: Not on file    Attends  meetings of clubs or organizations: Not on file    Relationship status: Not on file  . Intimate partner violence:    Fear of current or ex partner: Not on file    Emotionally abused: Not on file    Physically abused: Not on file    Forced sexual activity: Not on file  Other Topics Concern  . Not on file  Social History Narrative   Lives with sister, [name] in [place].  Retired - former Sports coach.   Patient in skilled nursing facility   Family History  Problem Relation Age of Onset  . Cancer Mother        lung cancer  . Hypertension Mother   . Cancer Sister        patient thinks it was uterine cancer  . Hypertension Sister   . Heart attack Sister   . Stroke Neg Hx     Current Outpatient Medications  Medication Sig Dispense Refill  . acetaminophen (TYLENOL) 325 MG tablet Take 650 mg by mouth every 8 (eight) hours as needed for mild pain or fever.     Marland Kitchen aspirin EC 81 MG tablet Take 1 tablet (81 mg total) by mouth daily. 150 tablet 0  . atorvastatin (LIPITOR) 40 MG tablet TAKE ONE (1) TABLET BY MOUTH EVERY DAY AT 6:00PM (Patient taking differently: Take 40 mg by mouth daily at 6 PM. ) 90 tablet 3  . Blood Glucose Monitoring Suppl (ONETOUCH VERIO FLEX SYSTEM) w/Device KIT 1 kit by Does not apply route daily. 1 kit 3  . carvedilol (COREG) 12.5 MG tablet Take 1 tablet (12.5 mg total) by mouth 2 (two) times daily with a meal. 60 tablet 0  . cloNIDine (CATAPRES - DOSED IN MG/24 HR) 0.2 mg/24hr patch Place 1 patch (0.2 mg total) onto the skin once a week. (Patient taking differently: Place 0.2 mg onto the skin every Monday. ) 4 patch 0  . Darbepoetin Alfa (ARANESP) 40 MCG/0.4ML SOSY injection Inject 0.4 mLs (40 mcg total) into the skin every 30 (thirty) days. 8.4 mL 2  . doxazosin (CARDURA) 8 MG tablet Take 1 tablet (8 mg total) by mouth every evening.    . feeding supplement, ENSURE ENLIVE, (ENSURE ENLIVE) LIQD Take 237 mLs by mouth 3 (three) times daily between meals. 237 mL 12   . ferrous sulfate (FEROSUL) 325 (65 FE) MG tablet Take 1 tablet (325 mg total) by mouth daily at 12 noon. 30 tablet 0  . glucose blood (ONETOUCH VERIO) test strip Use as instructed to test three times daily. ICD-10 code: E11.22 100 each 12  . hydrALAZINE (APRESOLINE) 100 MG tablet TAKE ONE TABLET BY MOUTH EVERY EIGHT HOURS (Patient taking differently:  Take 100 mg by mouth every 8 (eight) hours. ) 90 tablet 1  . ipratropium-albuterol (DUONEB) 0.5-2.5 (3) MG/3ML SOLN Take 3 mLs by nebulization 4 (four) times daily as needed (shortness of breath and wheezing).     . isosorbide mononitrate (IMDUR) 120 MG 24 hr tablet Take 1 tablet (120 mg total) by mouth daily. 30 tablet 0  . levothyroxine (SYNTHROID, LEVOTHROID) 100 MCG tablet Take 100 mcg by mouth daily before breakfast.     . LONHALA MAGNAIR STARTER KIT 25 MCG/ML SOLN Inhale 1 mL into the lungs 2 (two) times daily.    . nitroGLYCERIN (NITROSTAT) 0.4 MG SL tablet Place 1 tablet (0.4 mg total) under the tongue every 5 (five) minutes as needed for chest pain. 30 tablet 12  . ONETOUCH DELICA LANCETS 38G MISC 1 each by Does not apply route 3 (three) times daily. ICD-10 code: E11.22 100 each 12  . pantoprazole (PROTONIX) 40 MG tablet Take 40 mg by mouth daily.    . polyethylene glycol (MIRALAX / GLYCOLAX) packet Take 17 g by mouth daily as needed for mild constipation or moderate constipation. (Patient taking differently: Take 17 g by mouth every 8 (eight) hours as needed for mild constipation. ) 14 each 0  . potassium chloride SA (K-DUR,KLOR-CON) 20 MEQ tablet Take 40 mEq by mouth daily.    Marland Kitchen torsemide (DEMADEX) 20 MG tablet Take 3 tablets (60 mg total) by mouth daily. 90 tablet 3   No current facility-administered medications for this encounter.    Facility-Administered Medications Ordered in Other Encounters  Medication Dose Route Frequency Provider Last Rate Last Dose  . sodium chloride flush (NS) 0.9 % injection 10 mL  10 mL Intracatheter PRN  Alvy Bimler, Ni, MD       Vitals:   10/20/17 1218  BP: 124/64  Pulse: 82  SpO2: 98%  Weight: 70.8 kg (156 lb)     Wt Readings from Last 3 Encounters:  10/20/17 70.8 kg (156 lb)  10/06/17 70.5 kg (155 lb 6.4 oz)  10/01/17 72.5 kg (159 lb 13.3 oz)   General: Well appearing. No resp difficulty. HEENT: Normal Neck: Supple. JVP 5-6. Carotids 2+ bilat; no bruits. No thyromegaly or nodule noted. Cor: PMI nondisplaced. RRR, No M/G/R noted Lungs: CTAB, normal effort. Abdomen: Soft, non-tender, non-distended, no HSM. No bruits or masses. +BS  Extremities: No cyanosis, clubbing, or rash. Left AKA. RLE trace ankle edema.   Neuro: Alert & orientedx3, cranial nerves grossly intact. moves all 4 extremities w/o difficulty. Affect pleasant   Assessment/Plan: 1. Chronic diastolic CHF:  - Echo 07/6597: EF 65-70%, grade 2 DD, mild TR, PA peak pressure 46 mmHg - Echo 09/2017: EF 50-55%, LA dilated, RV normal - NYHA III symptoms. Confounded by deconditioning and Left AKA.  - Volume status stable on exam.   - Continue torsemide 60 mg daily.  - Not on spiro with CKD. - Continue TED hose to RLE - Reinforced fluid restriction to < 2 L daily, sodium restriction to less than 2000 mg daily, and the importance of daily weights.    2. HTN:  - On hydralazine 100 mg TID, imdur 120 mg daily, cardura 8 mg daily, coreg 12.5 mg BID, and clonidine patch 0.2 mg weekly.  - Stable.   3. CAD s.p CABG No s/s of ischemia.    - Continue ASA, statin and Coreg.  - Elevated troponin (peak 3.98) during recent admission. Myoview 09/25/17: EF 45%, no ischemia     4. COPD: Gold stage  III   - Severe obstruction on prior PFTs. No longer on O2 at home.  - Stable. No change.  - Follows with Dr. Melvyn Novas. Weaned off O2 while inpatient. Encouraged him to follow up with pulmonary.    5. CKD: Stage V.-  L AVF.  - Follows with Dr Marval Regal.  - Baseline creatinine 2.0 - 2.2 (?) - BMET today.   6. Hx of DVT - Diagnosed 08/30/16 -  No longer on AC.  No change.   7. Hx of pleural effusions R>L - Stable on exam.   8. Extensive PAD - s/p L AKA for extensive LLE wounds with no re-vascularization options in 03/2017 by Dr. Donzetta Matters. No change.   9. Anemia - Denies bleeding.   10. Pulmonary HTN - PA peak pressure 63 mmHg on Echo 07/20/17 - Suspect primarily WHO Group III and WHO Group II pulmonary HTN. Suspect he has mild/modPulm Hypertensionat baselineand pressures wereelevated in the setting of volume overload.  - No change.   11. Deconditioning - PT/OT as tolerated.   Doing well overall. Labs today. RTC 3 weeks. Sooner with symptoms.   Shirley Friar, PA-C 10/20/2017   Greater than 50% of the 25 minute visit was spent in counseling/coordination of care regarding disease state education, salt/fluid restriction, sliding scale diuretics, and medication compliance.

## 2017-10-20 NOTE — Patient Instructions (Signed)
No medication changes  Labs today We will only contact you if something comes back abnormal or we need to make some changes. Otherwise no news is good news!  Your physician recommends that you schedule a follow-up appointment in: 3 weeks with Rebecca Eaton   Do the following things EVERYDAY: 1) Weigh yourself in the morning before breakfast. Write it down and keep it in a log. 2) Take your medicines as prescribed 3) Eat low salt foods-Limit salt (sodium) to 2000 mg per day.  4) Stay as active as you can everyday 5) Limit all fluids for the day to less than 2 liters

## 2017-10-29 DIAGNOSIS — L089 Local infection of the skin and subcutaneous tissue, unspecified: Secondary | ICD-10-CM | POA: Diagnosis not present

## 2017-10-29 DIAGNOSIS — L723 Sebaceous cyst: Secondary | ICD-10-CM | POA: Diagnosis not present

## 2017-10-30 DIAGNOSIS — R319 Hematuria, unspecified: Secondary | ICD-10-CM | POA: Diagnosis not present

## 2017-10-30 DIAGNOSIS — D649 Anemia, unspecified: Secondary | ICD-10-CM | POA: Diagnosis not present

## 2017-10-30 DIAGNOSIS — Z79899 Other long term (current) drug therapy: Secondary | ICD-10-CM | POA: Diagnosis not present

## 2017-10-30 DIAGNOSIS — N390 Urinary tract infection, site not specified: Secondary | ICD-10-CM | POA: Diagnosis not present

## 2017-11-02 ENCOUNTER — Other Ambulatory Visit (HOSPITAL_COMMUNITY): Payer: Self-pay | Admitting: Family

## 2017-11-02 DIAGNOSIS — I150 Renovascular hypertension: Secondary | ICD-10-CM | POA: Diagnosis not present

## 2017-11-02 DIAGNOSIS — B9689 Other specified bacterial agents as the cause of diseases classified elsewhere: Secondary | ICD-10-CM | POA: Diagnosis not present

## 2017-11-02 DIAGNOSIS — E039 Hypothyroidism, unspecified: Secondary | ICD-10-CM | POA: Diagnosis not present

## 2017-11-02 DIAGNOSIS — B999 Unspecified infectious disease: Secondary | ICD-10-CM

## 2017-11-02 DIAGNOSIS — I5032 Chronic diastolic (congestive) heart failure: Secondary | ICD-10-CM | POA: Diagnosis not present

## 2017-11-03 ENCOUNTER — Other Ambulatory Visit: Payer: Self-pay | Admitting: Student

## 2017-11-04 ENCOUNTER — Other Ambulatory Visit (HOSPITAL_COMMUNITY): Payer: Self-pay | Admitting: Family

## 2017-11-04 ENCOUNTER — Encounter (HOSPITAL_COMMUNITY): Payer: Self-pay | Admitting: Interventional Radiology

## 2017-11-04 ENCOUNTER — Ambulatory Visit (HOSPITAL_COMMUNITY)
Admission: RE | Admit: 2017-11-04 | Discharge: 2017-11-04 | Disposition: A | Discharge status: Home / Self Care | Payer: Medicare Other | Source: Ambulatory Visit | Attending: Family | Admitting: Family

## 2017-11-04 DIAGNOSIS — J449 Chronic obstructive pulmonary disease, unspecified: Secondary | ICD-10-CM | POA: Insufficient documentation

## 2017-11-04 DIAGNOSIS — E039 Hypothyroidism, unspecified: Secondary | ICD-10-CM | POA: Diagnosis not present

## 2017-11-04 DIAGNOSIS — Z951 Presence of aortocoronary bypass graft: Secondary | ICD-10-CM | POA: Insufficient documentation

## 2017-11-04 DIAGNOSIS — Z87891 Personal history of nicotine dependence: Secondary | ICD-10-CM | POA: Diagnosis not present

## 2017-11-04 DIAGNOSIS — I2510 Atherosclerotic heart disease of native coronary artery without angina pectoris: Secondary | ICD-10-CM | POA: Diagnosis not present

## 2017-11-04 DIAGNOSIS — B999 Unspecified infectious disease: Secondary | ICD-10-CM | POA: Diagnosis not present

## 2017-11-04 DIAGNOSIS — N184 Chronic kidney disease, stage 4 (severe): Secondary | ICD-10-CM | POA: Insufficient documentation

## 2017-11-04 DIAGNOSIS — Z79890 Hormone replacement therapy (postmenopausal): Secondary | ICD-10-CM | POA: Diagnosis not present

## 2017-11-04 DIAGNOSIS — I130 Hypertensive heart and chronic kidney disease with heart failure and stage 1 through stage 4 chronic kidney disease, or unspecified chronic kidney disease: Secondary | ICD-10-CM | POA: Diagnosis not present

## 2017-11-04 DIAGNOSIS — Z452 Encounter for adjustment and management of vascular access device: Secondary | ICD-10-CM | POA: Diagnosis not present

## 2017-11-04 DIAGNOSIS — D631 Anemia in chronic kidney disease: Secondary | ICD-10-CM | POA: Insufficient documentation

## 2017-11-04 DIAGNOSIS — Z8249 Family history of ischemic heart disease and other diseases of the circulatory system: Secondary | ICD-10-CM | POA: Diagnosis not present

## 2017-11-04 DIAGNOSIS — G4733 Obstructive sleep apnea (adult) (pediatric): Secondary | ICD-10-CM | POA: Diagnosis not present

## 2017-11-04 DIAGNOSIS — E785 Hyperlipidemia, unspecified: Secondary | ICD-10-CM | POA: Insufficient documentation

## 2017-11-04 DIAGNOSIS — Z86718 Personal history of other venous thrombosis and embolism: Secondary | ICD-10-CM | POA: Insufficient documentation

## 2017-11-04 DIAGNOSIS — E1122 Type 2 diabetes mellitus with diabetic chronic kidney disease: Secondary | ICD-10-CM | POA: Insufficient documentation

## 2017-11-04 DIAGNOSIS — Z79899 Other long term (current) drug therapy: Secondary | ICD-10-CM | POA: Diagnosis not present

## 2017-11-04 DIAGNOSIS — Z7982 Long term (current) use of aspirin: Secondary | ICD-10-CM | POA: Insufficient documentation

## 2017-11-04 DIAGNOSIS — I272 Pulmonary hypertension, unspecified: Secondary | ICD-10-CM | POA: Diagnosis not present

## 2017-11-04 DIAGNOSIS — Z823 Family history of stroke: Secondary | ICD-10-CM | POA: Diagnosis not present

## 2017-11-04 DIAGNOSIS — N189 Chronic kidney disease, unspecified: Secondary | ICD-10-CM | POA: Diagnosis not present

## 2017-11-04 DIAGNOSIS — I5032 Chronic diastolic (congestive) heart failure: Secondary | ICD-10-CM | POA: Insufficient documentation

## 2017-11-04 HISTORY — PX: IR FLUORO GUIDE CV LINE RIGHT: IMG2283

## 2017-11-04 HISTORY — PX: IR US GUIDE VASC ACCESS RIGHT: IMG2390

## 2017-11-04 MED ORDER — LIDOCAINE HCL (PF) 1 % IJ SOLN
INTRAMUSCULAR | Status: AC | PRN
  Administered 2017-11-04: 5 mL

## 2017-11-04 MED ORDER — HEPARIN SOD (PORK) LOCK FLUSH 100 UNIT/ML IV SOLN
INTRAVENOUS | Status: AC
  Filled 2017-11-04: qty 5

## 2017-11-04 MED ORDER — LIDOCAINE HCL 1 % IJ SOLN
INTRAMUSCULAR | Status: AC
  Filled 2017-11-04: qty 20

## 2017-11-04 NOTE — Procedures (Signed)
Pre procedural Diagnosis: Poor venous access Post Procedural Diagnosis: Same  Successful placement of right IJ approach single lumen tunneled CVC with tip at the superior caval-atrial junction.    EBL: None  No immediate post procedural complication.  The CVC is ready for immediate use.  Ronny Bacon, MD Pager #: [phone]

## 2017-11-05 DIAGNOSIS — N390 Urinary tract infection, site not specified: Secondary | ICD-10-CM | POA: Diagnosis not present

## 2017-11-05 DIAGNOSIS — E088 Diabetes mellitus due to underlying condition with unspecified complications: Secondary | ICD-10-CM | POA: Diagnosis not present

## 2017-11-05 DIAGNOSIS — I5032 Chronic diastolic (congestive) heart failure: Secondary | ICD-10-CM | POA: Diagnosis not present

## 2017-11-05 DIAGNOSIS — L089 Local infection of the skin and subcutaneous tissue, unspecified: Secondary | ICD-10-CM | POA: Diagnosis not present

## 2017-11-06 DIAGNOSIS — D649 Anemia, unspecified: Secondary | ICD-10-CM | POA: Diagnosis not present

## 2017-11-06 DIAGNOSIS — Z79899 Other long term (current) drug therapy: Secondary | ICD-10-CM | POA: Diagnosis not present

## 2017-11-09 DIAGNOSIS — Z23 Encounter for immunization: Secondary | ICD-10-CM | POA: Diagnosis not present

## 2017-11-10 ENCOUNTER — Encounter (HOSPITAL_COMMUNITY): Payer: Self-pay

## 2017-11-10 ENCOUNTER — Ambulatory Visit (HOSPITAL_COMMUNITY)
Admission: RE | Admit: 2017-11-10 | Discharge: 2017-11-10 | Disposition: A | Discharge status: Home / Self Care | Payer: Medicare Other | Source: Ambulatory Visit | Attending: Cardiology | Admitting: Cardiology

## 2017-11-10 VITALS — BP 170/78 | HR 74 | Wt 175.6 lb

## 2017-11-10 DIAGNOSIS — I2722 Pulmonary hypertension due to left heart disease: Secondary | ICD-10-CM | POA: Diagnosis not present

## 2017-11-10 DIAGNOSIS — I2510 Atherosclerotic heart disease of native coronary artery without angina pectoris: Secondary | ICD-10-CM | POA: Diagnosis not present

## 2017-11-10 DIAGNOSIS — E1122 Type 2 diabetes mellitus with diabetic chronic kidney disease: Secondary | ICD-10-CM | POA: Diagnosis not present

## 2017-11-10 DIAGNOSIS — Z79899 Other long term (current) drug therapy: Secondary | ICD-10-CM | POA: Insufficient documentation

## 2017-11-10 DIAGNOSIS — Z9981 Dependence on supplemental oxygen: Secondary | ICD-10-CM | POA: Diagnosis not present

## 2017-11-10 DIAGNOSIS — Z79890 Hormone replacement therapy (postmenopausal): Secondary | ICD-10-CM | POA: Diagnosis not present

## 2017-11-10 DIAGNOSIS — J90 Pleural effusion, not elsewhere classified: Secondary | ICD-10-CM | POA: Diagnosis not present

## 2017-11-10 DIAGNOSIS — Z8249 Family history of ischemic heart disease and other diseases of the circulatory system: Secondary | ICD-10-CM | POA: Insufficient documentation

## 2017-11-10 DIAGNOSIS — J449 Chronic obstructive pulmonary disease, unspecified: Secondary | ICD-10-CM | POA: Diagnosis not present

## 2017-11-10 DIAGNOSIS — I10 Essential (primary) hypertension: Secondary | ICD-10-CM | POA: Diagnosis not present

## 2017-11-10 DIAGNOSIS — D649 Anemia, unspecified: Secondary | ICD-10-CM | POA: Diagnosis not present

## 2017-11-10 DIAGNOSIS — E785 Hyperlipidemia, unspecified: Secondary | ICD-10-CM | POA: Insufficient documentation

## 2017-11-10 DIAGNOSIS — Z86718 Personal history of other venous thrombosis and embolism: Secondary | ICD-10-CM | POA: Diagnosis not present

## 2017-11-10 DIAGNOSIS — N184 Chronic kidney disease, stage 4 (severe): Secondary | ICD-10-CM

## 2017-11-10 DIAGNOSIS — Z7982 Long term (current) use of aspirin: Secondary | ICD-10-CM | POA: Insufficient documentation

## 2017-11-10 DIAGNOSIS — E039 Hypothyroidism, unspecified: Secondary | ICD-10-CM | POA: Insufficient documentation

## 2017-11-10 DIAGNOSIS — I5032 Chronic diastolic (congestive) heart failure: Secondary | ICD-10-CM

## 2017-11-10 DIAGNOSIS — N185 Chronic kidney disease, stage 5: Secondary | ICD-10-CM | POA: Diagnosis not present

## 2017-11-10 DIAGNOSIS — I739 Peripheral vascular disease, unspecified: Secondary | ICD-10-CM | POA: Diagnosis not present

## 2017-11-10 DIAGNOSIS — Z89612 Acquired absence of left leg above knee: Secondary | ICD-10-CM | POA: Diagnosis not present

## 2017-11-10 DIAGNOSIS — G4733 Obstructive sleep apnea (adult) (pediatric): Secondary | ICD-10-CM | POA: Diagnosis not present

## 2017-11-10 DIAGNOSIS — Z951 Presence of aortocoronary bypass graft: Secondary | ICD-10-CM

## 2017-11-10 DIAGNOSIS — I132 Hypertensive heart and chronic kidney disease with heart failure and with stage 5 chronic kidney disease, or end stage renal disease: Secondary | ICD-10-CM | POA: Diagnosis not present

## 2017-11-10 DIAGNOSIS — Z87891 Personal history of nicotine dependence: Secondary | ICD-10-CM | POA: Insufficient documentation

## 2017-11-10 LAB — CBC
HCT: 25.2 % — ABNORMAL LOW (ref 39.0–52.0)
HEMOGLOBIN: 7.6 g/dL — AB (ref 13.0–17.0)
MCH: 27.1 pg (ref 26.0–34.0)
MCHC: 30.2 g/dL (ref 30.0–36.0)
MCV: 90 fL (ref 80.0–100.0)
Platelets: 109 10*3/uL — ABNORMAL LOW (ref 150–400)
RBC: 2.8 MIL/uL — ABNORMAL LOW (ref 4.22–5.81)
RDW: 15.7 % — ABNORMAL HIGH (ref 11.5–15.5)
WBC: 3.9 10*3/uL — AB (ref 4.0–10.5)
nRBC: 0 % (ref 0.0–0.2)

## 2017-11-10 LAB — BASIC METABOLIC PANEL
ANION GAP: 13 (ref 5–15)
BUN: 26 mg/dL — AB (ref 8–23)
CHLORIDE: 110 mmol/L (ref 98–111)
CO2: 24 mmol/L (ref 22–32)
Calcium: 8.9 mg/dL (ref 8.9–10.3)
Creatinine, Ser: 1.81 mg/dL — ABNORMAL HIGH (ref 0.61–1.24)
GFR calc Af Amer: 43 mL/min — ABNORMAL LOW (ref >60)
GFR calc non Af Amer: 37 mL/min — ABNORMAL LOW (ref >60)
Glucose, Bld: 173 mg/dL — ABNORMAL HIGH (ref 70–99)
POTASSIUM: 3.4 mmol/L — AB (ref 3.5–5.1)
SODIUM: 147 mmol/L — AB (ref 135–145)

## 2017-11-10 MED ORDER — TORSEMIDE 20 MG PO TABS: ORAL_TABLET | ORAL | 3 refills | Status: DC

## 2017-11-10 NOTE — Patient Instructions (Signed)
INCREASE Torsemide to 80 mg twice a day for 2 days, then resume normal dose of 80 mg daily thereafter  Labs today We will only contact you if something comes back abnormal or we need to make some changes. Otherwise no news is good news!  Your physician recommends that you schedule a follow-up appointment in: 3 weeks  in the [facility]     Do the following things EVERYDAY: 1) Weigh yourself in the morning before breakfast. Write it down and keep it in a log. 2) Take your medicines as prescribed 3) Eat low salt foods-Limit salt (sodium) to 2000 mg per day.  4) Stay as active as you can everyday 5) Limit all fluids for the day to less than 2 liters

## 2017-11-10 NOTE — Progress Notes (Signed)
Advanced Heart Failure Clinic Note  PCP: Dr. Juanito Doom Cardiology: Dr. Aundra Dubin Nephrology: Dr Marval Regal.   Leonard [name] is a 67 y.o. male with history of CAD s/p CABG, PAD, chronic diastolic CHF, CKD stage IV, COPD on home oxygen, and HTN presents for cardiology followup.  He has had multiple admissions this year for diastolic CHF.  Last echo in 5/17 showed EF 60-65% with grade II diastolic dysfunction.  He also has COPD with PFTs showing severe obstruction.    Admitted 7/23-7/28/19 with A/C HF. Initial poor response to IV lasix with worsening creatinine. HF team consulted. Diuresed 20 lbs with high dose lasix and metolazone. AKI resolved prior to discharge. Transitioned to torsemide 40 mg daily (previously on 20 mg daily). Chest CT showed large right pleural effusion. Did not get thoracentesis. He was also started on aranesp for anemia. DC weight: 164 lbs. DCd to SNF.   Admitted 8/21-8/29/19 with A/C HF. He was initially hypoxic with AMS, but improved with BiPAP and diuresis. Diuresed with IV lasix. He was initially transitioned to torsemide 80 mg daily, but had mild AKI, so was decreased to torsemide 80 mg every other day He had positive troponins (peaked at 4). Myoview showed no ischemia. Palliative consulted and he remained full code. He received blood for anemia with no obvious source of bleeding. DC weight: 159 lbs. DCd to [facility].  He presents today for regular follow up. Has been feeling OK overall. Being treated for UTI at facility. Watching salt and fluid, though meds and meals provided by SNF. He has not had any medicines today due to "early" appointment.  He has mild peripheral edema. Continues to work with PT. Denies fevers or chills. No lightheadedness or dizziness. No orthopnea or PND, though weight is up ~ 20 lbs in the past month. Denies HA, unilateral weakness, visual changes, or slurred speech.   Labs (8/17): K 3.9, creatinine 2.33, HCT 28.5, LDL 46 Labs (11/17): hgb 9, K  4.4, creatinine 3, BNP 64 Labs 03/24/2016: K 3.5 Creatinine 2.48.  Labs 05/09/2016: K 4.3 Creatinine 3.13 Hgb 9.0   PMH: 1. Chronic diastolic CHF: Multiple recent admissions.  Echo (5/17) with EF 60-65%, grade II diastolic dysfunction, normal RV size and systolic function.  2. PAD: Right femoral PCI in 12/14. Peripheral arterial dopplers in 6/17 with occluded right SFA.  He follows with [facility].  3. HTN: Renal artery dopplers negative in 5/17.  4. Hypothyroidism 5. CKD stage IV: Follows with Dr. Marval Regal. 6. CAD: s/p CABG x 3 in 3/15.  7. OSA: Mild, not on CPAP.  8. COPD: PFTs (3/15) with FVC 77%, FEV1 47%, ratio 59%, TLC 55% => severe obstruction.  Prior smoker. He is on 2 L home O2.  9. 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 Hyperlipidemia 12. Anemia of renal disease  Review of systems complete and found to be negative unless listed in HPI.    Social History   Socioeconomic History  . Marital status: Single    Spouse name: Not on file  . Number of children: Not on file  . Years of education: Not on file  . Highest education level: Not on file  Occupational History  . Not on file  Social Needs  . Financial resource strain: Not on file  . Food insecurity:    Worry: Not on file    Inability: Not on file  . Transportation needs:    Medical: Not on file    Non-medical: Not on file  Tobacco Use  .  Smoking status: Former Smoker    Packs/day: 1.00    Years: 46.00    Pack years: 46.00    Types: Cigarettes    Start date: 02/04/1967    Last attempt to quit: 02/03/2013    Years since quitting: 4.7  . Smokeless tobacco: Never Used  Substance and Sexual Activity  . Alcohol use: No    Alcohol/week: 0.0 standard drinks  . Drug use: No  . Sexual activity: Not Currently  Lifestyle  . Physical activity:    Days per week: Not on file    Minutes per session: Not on file  . Stress: Not on file  Relationships  . Social connections:    Talks on phone: Not on file    Gets together: Not on file     Attends religious service: Not on file    Active member of club or organization: Not on file    Attends meetings of clubs or organizations: Not on file    Relationship status: Not on file  . Intimate partner violence:    Fear of current or ex partner: Not on file    Emotionally abused: Not on file    Physically abused: Not on file    Forced sexual activity: Not on file  Other Topics Concern  . Not on file  Social History Narrative   Lives with sister, [name] in [place].  Retired - former Sports coach.   Patient in skilled nursing facility   Family History  Problem Relation Age of Onset  . Cancer Mother        lung cancer  . Hypertension Mother   . Cancer Sister        patient thinks it was uterine cancer  . Hypertension Sister   . Heart attack Sister   . Stroke Neg Hx     Current Outpatient Medications  Medication Sig Dispense Refill  . acetaminophen (TYLENOL) 325 MG tablet Take 650 mg by mouth every 8 (eight) hours as needed for mild pain or fever.     Marland Kitchen aspirin EC 81 MG tablet Take 1 tablet (81 mg total) by mouth daily. 150 tablet 0  . atorvastatin (LIPITOR) 40 MG tablet TAKE ONE (1) TABLET BY MOUTH EVERY DAY AT 6:00PM (Patient taking differently: Take 40 mg by mouth daily at 6 PM. ) 90 tablet 3  . Blood Glucose Monitoring Suppl (ONETOUCH VERIO FLEX SYSTEM) w/Device KIT 1 kit by Does not apply route daily. 1 kit 3  . carvedilol (COREG) 12.5 MG tablet Take 1 tablet (12.5 mg total) by mouth 2 (two) times daily with a meal. 60 tablet 0  . cloNIDine (CATAPRES - DOSED IN MG/24 HR) 0.2 mg/24hr patch Place 1 patch (0.2 mg total) onto the skin once a week. (Patient taking differently: Place 0.2 mg onto the skin every Monday. ) 4 patch 0  . doxazosin (CARDURA) 8 MG tablet Take 1 tablet (8 mg total) by mouth every evening.    . feeding supplement, ENSURE ENLIVE, (ENSURE ENLIVE) LIQD Take 237 mLs by mouth 3 (three) times daily between meals. 237 mL 12  . ferrous sulfate (FEROSUL)  325 (65 FE) MG tablet Take 1 tablet (325 mg total) by mouth daily at 12 noon. 30 tablet 0  . glucose blood (ONETOUCH VERIO) test strip Use as instructed to test three times daily. ICD-10 code: E11.22 100 each 12  . hydrALAZINE (APRESOLINE) 100 MG tablet TAKE ONE TABLET BY MOUTH EVERY EIGHT HOURS (Patient taking differently: Take 100  mg by mouth every 8 (eight) hours. ) 90 tablet 1  . ipratropium-albuterol (DUONEB) 0.5-2.5 (3) MG/3ML SOLN Take 3 mLs by nebulization 4 (four) times daily as needed (shortness of breath and wheezing).     . isosorbide mononitrate (IMDUR) 120 MG 24 hr tablet Take 1 tablet (120 mg total) by mouth daily. 30 tablet 0  . levothyroxine (SYNTHROID, LEVOTHROID) 100 MCG tablet Take 100 mcg by mouth daily before breakfast.     . LONHALA MAGNAIR STARTER KIT 25 MCG/ML SOLN Inhale 1 mL into the lungs 2 (two) times daily.    Glory Rosebush DELICA LANCETS 46K MISC 1 each by Does not apply route 3 (three) times daily. ICD-10 code: E11.22 100 each 12  . pantoprazole (PROTONIX) 40 MG tablet Take 40 mg by mouth daily.    . polyethylene glycol (MIRALAX / GLYCOLAX) packet Take 17 g by mouth daily as needed for mild constipation or moderate constipation. (Patient taking differently: Take 17 g by mouth every 8 (eight) hours as needed for mild constipation. ) 14 each 0  . potassium chloride SA (K-DUR,KLOR-CON) 20 MEQ tablet Take 40 mEq by mouth daily.    Marland Kitchen torsemide (DEMADEX) 20 MG tablet Take 4 tablets (80 mg total) by mouth 2 (two) times daily for 2 days, THEN 4 tablets (80 mg total) daily. 90 tablet 3  . Darbepoetin Alfa (ARANESP) 40 MCG/0.4ML SOSY injection Inject 0.4 mLs (40 mcg total) into the skin every 30 (thirty) days. (Patient not taking: Reported on 11/10/2017) 8.4 mL 2  . nitroGLYCERIN (NITROSTAT) 0.4 MG SL tablet Place 1 tablet (0.4 mg total) under the tongue every 5 (five) minutes as needed for chest pain. (Patient not taking: Reported on 11/10/2017) 30 tablet 12   No current  facility-administered medications for this encounter.    Facility-Administered Medications Ordered in Other Encounters  Medication Dose Route Frequency Provider Last Rate Last Dose  . sodium chloride flush (NS) 0.9 % injection 10 mL  10 mL Intracatheter PRN Heath Lark, MD       Vitals:   11/10/17 0844 11/10/17 0900  BP: (!) 180/80 (!) 170/78  Pulse: 74   SpO2: 97%   Weight: 79.7 kg (175 lb 9.6 oz)      Wt Readings from Last 3 Encounters:  11/10/17 79.7 kg (175 lb 9.6 oz)  10/20/17 70.8 kg (156 lb)  10/06/17 70.5 kg (155 lb 6.4 oz)   General: Fatigued appearing.  No resp difficulty. HEENT: Normal Neck: Supple. JVP to jaw. Carotids 2+ bilat; no bruits. No thyromegaly or nodule noted. Cor: PMI nondisplaced. RRR, No M/G/R noted Lungs: CTAB, normal effort. Abdomen: Soft, non-tender, non-distended, no HSM. No bruits or masses. +BS  Extremities: No cyanosis, clubbing, or rash. Left AKA. RLE with 1-2+ edema. Neuro: Alert & orientedx3, cranial nerves grossly intact. moves all 4 extremities w/o difficulty. Affect pleasant   Assessment/Plan: 1. Chronic diastolic CHF:  - Echo 06/9933: EF 65-70%, grade 2 DD, mild TR, PA peak pressure 46 mmHg - Echo 09/2017: EF 50-55%, LA dilated, RV normal - NYHA III symptoms chronically. Confounded by deconditioning and Left AKA.  - Volume status elevated on exam.  - Increase torsemide to 80 mg BID x 2 days, then 80 mg daily. BMET today.  - Not on spiro with CKD. - Continue TED hose to RLE - Reinforced fluid restriction to < 2 L daily, sodium restriction to less than 2000 mg daily, and the importance of daily weights.    2. HTN:  -  Continue on hydralazine 100 mg TID, imdur 120 mg daily, cardura 8 mg daily, coreg 12.5 mg BID, and clonidine patch 0.2 mg weekly.  - Elevated on arrival to clinic in absence of am meds and with volume overload.  - Have asked facility to check BP BID and fax to CHF clinic.   3. CAD s.p CABG - No s/s of ischemia.    -  Continue ASA, statin and Coreg.  - Elevated troponin (peak 3.98) during recent admission. Myoview 09/25/17: EF 45%, no ischemia     4. COPD: Gold stage III   - Severe obstruction on prior PFTs. No longer on O2 at home.  - Stable. No change.  - Follows with Dr. Melvyn Novas. Weaned off O2 while inpatient. Encouraged him to follow up with pulmonary.    5. CKD: Stage V.-  L AVF.  - Follows with Dr Marval Regal.  - Baseline creatinine 2.0 - 2.2 (?) - BMET today.   6. Hx of DVT - Diagnosed 08/30/16 - No longer on AC.  No change.   7. Hx of pleural effusions R>L - Stable on exam.   8. Extensive PAD - s/p L AKA for extensive LLE wounds with no re-vascularization options in 03/2017 by Dr. Donzetta Matters. No change.   9. Anemia - No bleeding.   10. Pulmonary HTN - PA peak pressure 63 mmHg on Echo 07/20/17 - Suspect primarily WHO Group III and WHO Group II pulmonary HTN. Suspect he has mild/modPulm Hypertensionat baselineand pressures wereelevated in the setting of volume overload.  - No change to current plan.    11. Deconditioning - PT/OT as tolerated.   Meds as above. Volume elevated on exam. RTC 3 weeks. Sooner with symptoms. Will decide on repeat bloodwork based on BMET today.   Shirley Friar, PA-C 11/10/2017   Greater than 50% of the 25 minute visit was spent in counseling/coordination of care regarding disease state education, salt/fluid restriction, sliding scale diuretics, and medication compliance.

## 2017-11-19 DIAGNOSIS — N179 Acute kidney failure, unspecified: Secondary | ICD-10-CM | POA: Diagnosis not present

## 2017-11-19 DIAGNOSIS — I129 Hypertensive chronic kidney disease with stage 1 through stage 4 chronic kidney disease, or unspecified chronic kidney disease: Secondary | ICD-10-CM | POA: Diagnosis not present

## 2017-11-19 DIAGNOSIS — E1152 Type 2 diabetes mellitus with diabetic peripheral angiopathy with gangrene: Secondary | ICD-10-CM | POA: Diagnosis not present

## 2017-11-19 DIAGNOSIS — Z23 Encounter for immunization: Secondary | ICD-10-CM | POA: Diagnosis not present

## 2017-11-19 DIAGNOSIS — E1122 Type 2 diabetes mellitus with diabetic chronic kidney disease: Secondary | ICD-10-CM | POA: Diagnosis not present

## 2017-11-19 DIAGNOSIS — N184 Chronic kidney disease, stage 4 (severe): Secondary | ICD-10-CM | POA: Diagnosis not present

## 2017-11-19 DIAGNOSIS — N2581 Secondary hyperparathyroidism of renal origin: Secondary | ICD-10-CM | POA: Diagnosis not present

## 2017-11-19 DIAGNOSIS — I739 Peripheral vascular disease, unspecified: Secondary | ICD-10-CM | POA: Diagnosis not present

## 2017-11-19 DIAGNOSIS — I5030 Unspecified diastolic (congestive) heart failure: Secondary | ICD-10-CM | POA: Diagnosis not present

## 2017-11-19 DIAGNOSIS — I2510 Atherosclerotic heart disease of native coronary artery without angina pectoris: Secondary | ICD-10-CM | POA: Diagnosis not present

## 2017-11-19 DIAGNOSIS — D631 Anemia in chronic kidney disease: Secondary | ICD-10-CM | POA: Diagnosis not present

## 2017-11-19 DIAGNOSIS — J449 Chronic obstructive pulmonary disease, unspecified: Secondary | ICD-10-CM | POA: Diagnosis not present

## 2017-11-20 DIAGNOSIS — N184 Chronic kidney disease, stage 4 (severe): Secondary | ICD-10-CM | POA: Diagnosis not present

## 2017-11-20 DIAGNOSIS — D649 Anemia, unspecified: Secondary | ICD-10-CM | POA: Diagnosis not present

## 2017-11-20 DIAGNOSIS — D509 Iron deficiency anemia, unspecified: Secondary | ICD-10-CM | POA: Diagnosis not present

## 2017-11-20 DIAGNOSIS — E559 Vitamin D deficiency, unspecified: Secondary | ICD-10-CM | POA: Diagnosis not present

## 2017-11-20 DIAGNOSIS — E088 Diabetes mellitus due to underlying condition with unspecified complications: Secondary | ICD-10-CM | POA: Diagnosis not present

## 2017-11-20 DIAGNOSIS — M6281 Muscle weakness (generalized): Secondary | ICD-10-CM | POA: Diagnosis not present

## 2017-11-20 DIAGNOSIS — I5032 Chronic diastolic (congestive) heart failure: Secondary | ICD-10-CM | POA: Diagnosis not present

## 2017-11-24 ENCOUNTER — Other Ambulatory Visit (HOSPITAL_COMMUNITY): Payer: Self-pay | Admitting: Internal Medicine

## 2017-11-24 ENCOUNTER — Other Ambulatory Visit: Payer: Self-pay | Admitting: Internal Medicine

## 2017-11-24 DIAGNOSIS — B999 Unspecified infectious disease: Secondary | ICD-10-CM

## 2017-11-27 ENCOUNTER — Encounter (HOSPITAL_COMMUNITY): Payer: Self-pay | Admitting: Physician Assistant

## 2017-11-27 ENCOUNTER — Ambulatory Visit (HOSPITAL_COMMUNITY)
Admission: RE | Admit: 2017-11-27 | Discharge: 2017-11-27 | Disposition: A | Discharge status: Home / Self Care | Payer: Medicare Other | Source: Ambulatory Visit | Attending: Internal Medicine | Admitting: Internal Medicine

## 2017-11-27 DIAGNOSIS — B999 Unspecified infectious disease: Secondary | ICD-10-CM

## 2017-11-27 DIAGNOSIS — Z452 Encounter for adjustment and management of vascular access device: Secondary | ICD-10-CM | POA: Diagnosis not present

## 2017-11-27 DIAGNOSIS — Z8619 Personal history of other infectious and parasitic diseases: Secondary | ICD-10-CM | POA: Diagnosis not present

## 2017-11-27 HISTORY — PX: IR REMOVAL TUN CV CATH W/O FL: IMG2289

## 2017-11-27 MED ORDER — CHLORHEXIDINE GLUCONATE 4 % EX LIQD
CUTANEOUS | Status: AC
  Filled 2017-11-27: qty 15

## 2017-11-27 MED ORDER — LIDOCAINE HCL 1 % IJ SOLN
INTRAMUSCULAR | Status: AC
  Filled 2017-11-27: qty 20

## 2017-11-27 NOTE — Procedures (Signed)
Successful removal of tunneled PICC.   EBL: None No immediate complications.  Please see imaging section in Epic for full dictation.  Candiss Norse, PA-C

## 2017-11-30 DIAGNOSIS — I150 Renovascular hypertension: Secondary | ICD-10-CM | POA: Diagnosis not present

## 2017-11-30 DIAGNOSIS — I5032 Chronic diastolic (congestive) heart failure: Secondary | ICD-10-CM | POA: Diagnosis not present

## 2017-11-30 DIAGNOSIS — E039 Hypothyroidism, unspecified: Secondary | ICD-10-CM | POA: Diagnosis not present

## 2017-11-30 DIAGNOSIS — N184 Chronic kidney disease, stage 4 (severe): Secondary | ICD-10-CM | POA: Diagnosis not present

## 2017-12-01 DIAGNOSIS — D649 Anemia, unspecified: Secondary | ICD-10-CM | POA: Diagnosis not present

## 2017-12-01 DIAGNOSIS — Z79899 Other long term (current) drug therapy: Secondary | ICD-10-CM | POA: Diagnosis not present

## 2017-12-03 ENCOUNTER — Telehealth (HOSPITAL_COMMUNITY): Payer: Self-pay

## 2017-12-03 ENCOUNTER — Ambulatory Visit (HOSPITAL_COMMUNITY)
Admission: RE | Admit: 2017-12-03 | Discharge: 2017-12-03 | Disposition: A | Discharge status: Home / Self Care | Payer: Medicare Other | Source: Ambulatory Visit | Attending: Internal Medicine | Admitting: Internal Medicine

## 2017-12-03 ENCOUNTER — Encounter (HOSPITAL_COMMUNITY): Payer: Self-pay

## 2017-12-03 VITALS — BP 158/62 | HR 69 | Wt 180.6 lb

## 2017-12-03 DIAGNOSIS — I10 Essential (primary) hypertension: Secondary | ICD-10-CM

## 2017-12-03 DIAGNOSIS — Z951 Presence of aortocoronary bypass graft: Secondary | ICD-10-CM | POA: Diagnosis not present

## 2017-12-03 DIAGNOSIS — R5381 Other malaise: Secondary | ICD-10-CM

## 2017-12-03 DIAGNOSIS — N184 Chronic kidney disease, stage 4 (severe): Secondary | ICD-10-CM

## 2017-12-03 DIAGNOSIS — J90 Pleural effusion, not elsewhere classified: Secondary | ICD-10-CM | POA: Insufficient documentation

## 2017-12-03 DIAGNOSIS — Z79890 Hormone replacement therapy (postmenopausal): Secondary | ICD-10-CM | POA: Diagnosis not present

## 2017-12-03 DIAGNOSIS — E1122 Type 2 diabetes mellitus with diabetic chronic kidney disease: Secondary | ICD-10-CM | POA: Diagnosis not present

## 2017-12-03 DIAGNOSIS — N185 Chronic kidney disease, stage 5: Secondary | ICD-10-CM | POA: Insufficient documentation

## 2017-12-03 DIAGNOSIS — E088 Diabetes mellitus due to underlying condition with unspecified complications: Secondary | ICD-10-CM | POA: Diagnosis not present

## 2017-12-03 DIAGNOSIS — Z87891 Personal history of nicotine dependence: Secondary | ICD-10-CM | POA: Diagnosis not present

## 2017-12-03 DIAGNOSIS — I5032 Chronic diastolic (congestive) heart failure: Secondary | ICD-10-CM | POA: Diagnosis not present

## 2017-12-03 DIAGNOSIS — E039 Hypothyroidism, unspecified: Secondary | ICD-10-CM | POA: Insufficient documentation

## 2017-12-03 DIAGNOSIS — Z7982 Long term (current) use of aspirin: Secondary | ICD-10-CM | POA: Insufficient documentation

## 2017-12-03 DIAGNOSIS — I2510 Atherosclerotic heart disease of native coronary artery without angina pectoris: Secondary | ICD-10-CM

## 2017-12-03 DIAGNOSIS — G4733 Obstructive sleep apnea (adult) (pediatric): Secondary | ICD-10-CM | POA: Insufficient documentation

## 2017-12-03 DIAGNOSIS — Z79899 Other long term (current) drug therapy: Secondary | ICD-10-CM | POA: Diagnosis not present

## 2017-12-03 DIAGNOSIS — Z86718 Personal history of other venous thrombosis and embolism: Secondary | ICD-10-CM | POA: Insufficient documentation

## 2017-12-03 DIAGNOSIS — I132 Hypertensive heart and chronic kidney disease with heart failure and with stage 5 chronic kidney disease, or end stage renal disease: Secondary | ICD-10-CM | POA: Insufficient documentation

## 2017-12-03 DIAGNOSIS — E785 Hyperlipidemia, unspecified: Secondary | ICD-10-CM | POA: Insufficient documentation

## 2017-12-03 DIAGNOSIS — I739 Peripheral vascular disease, unspecified: Secondary | ICD-10-CM | POA: Diagnosis not present

## 2017-12-03 DIAGNOSIS — J449 Chronic obstructive pulmonary disease, unspecified: Secondary | ICD-10-CM

## 2017-12-03 DIAGNOSIS — I509 Heart failure, unspecified: Secondary | ICD-10-CM | POA: Diagnosis not present

## 2017-12-03 LAB — BASIC METABOLIC PANEL
Anion gap: 7 (ref 5–15)
BUN: 31 mg/dL — ABNORMAL HIGH (ref 8–23)
CHLORIDE: 113 mmol/L — AB (ref 98–111)
CO2: 23 mmol/L (ref 22–32)
CREATININE: 2.02 mg/dL — AB (ref 0.61–1.24)
Calcium: 8.2 mg/dL — ABNORMAL LOW (ref 8.9–10.3)
GFR calc non Af Amer: 33 mL/min — ABNORMAL LOW (ref >60)
GFR, EST AFRICAN AMERICAN: 38 mL/min — AB (ref >60)
Glucose, Bld: 200 mg/dL — ABNORMAL HIGH (ref 70–99)
POTASSIUM: 3 mmol/L — AB (ref 3.5–5.1)
SODIUM: 143 mmol/L (ref 135–145)

## 2017-12-03 LAB — BRAIN NATRIURETIC PEPTIDE: B NATRIURETIC PEPTIDE 5: 436.1 pg/mL — AB (ref 0.0–100.0)

## 2017-12-03 MED ORDER — TORSEMIDE 20 MG PO TABS: ORAL_TABLET | ORAL | 3 refills | Status: DC

## 2017-12-03 NOTE — Telephone Encounter (Signed)
Needs to start potassium at 40 meq daily. Needs repeat BMET 1 week. [facility] called spoke with pt nurse script for potassium faxed to [facility]

## 2017-12-03 NOTE — Progress Notes (Signed)
Advanced Heart Failure Clinic Note  PCP: Dr. Juanito Doom Cardiology: Dr. Aundra Dubin Nephrology: Dr Marval Regal.   Leonard [name] is a 67 y.o. male with history of CAD s/p CABG, PAD, chronic diastolic CHF, CKD stage IV, COPD on home oxygen, and HTN presents for cardiology followup.  He has had multiple admissions this year for diastolic CHF.  Last echo in 5/17 showed EF 60-65% with grade II diastolic dysfunction.  He also has COPD with PFTs showing severe obstruction.    Admitted 7/23-7/28/19 with A/C HF. Initial poor response to IV lasix with worsening creatinine. HF team consulted. Diuresed 20 lbs with high dose lasix and metolazone. AKI resolved prior to discharge. Transitioned to torsemide 40 mg daily (previously on 20 mg daily). Chest CT showed large right pleural effusion. Did not get thoracentesis. He was also started on aranesp for anemia. DC weight: 164 lbs. DCd to SNF.   Admitted 8/21-8/29/19 with A/C HF. He was initially hypoxic with AMS, but improved with BiPAP and diuresis. Diuresed with IV lasix. He was initially transitioned to torsemide 80 mg daily, but had mild AKI, so was decreased to torsemide 80 mg every other day He had positive troponins (peaked at 4). Myoview showed no ischemia. Palliative consulted and he remained full code. He received blood for anemia with no obvious source of bleeding. DC weight: 159 lbs. DCd to [place].  He presents today for regular follow up. Last visit Torsemide increased. Despite this, weight up 5 lbs. Feeling OK overall. Remains at SNF. Meds and meals provided. Denies fevers or chills. No lightheadedness or dizziness. Not currently working with PT/OT. Weight remains up about 15 lbs from August. Denies orthopnea or PND. Denies SOB bathing or getting dressed. Wife thinks he has been more fatigued lately. Saw Renal 2 weeks ago and he states everything "looked good" and no changes for now.   Labs (8/17): K 3.9, creatinine 2.33, HCT 28.5, LDL 46 Labs (11/17):  hgb 9, K 4.4, creatinine 3, BNP 64 Labs 03/24/2016: K 3.5 Creatinine 2.48.  Labs 05/09/2016: K 4.3 Creatinine 3.13 Hgb 9.0   PMH: 1. Chronic diastolic CHF: Multiple recent admissions.  Echo (5/17) with EF 60-65%, grade II diastolic dysfunction, normal RV size and systolic function.  2. PAD: Right femoral PCI in 12/14. Peripheral arterial dopplers in 6/17 with occluded right SFA.  He follows with [facility].  3. HTN: Renal artery dopplers negative in 5/17.  4. Hypothyroidism 5. CKD stage IV: Follows with Dr. Marval Regal. 6. CAD: s/p CABG x 3 in 3/15.  7. OSA: Mild, not on CPAP.  8. COPD: PFTs (3/15) with FVC 77%, FEV1 47%, ratio 59%, TLC 55% => severe obstruction.  Prior smoker. He is on 2 L home O2.  9. 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 Hyperlipidemia 12. Anemia of renal disease  Review of systems complete and found to be negative unless listed in HPI.    Social History   Socioeconomic History  . Marital status: Single    Spouse name: Not on file  . Number of children: Not on file  . Years of education: Not on file  . Highest education level: Not on file  Occupational History  . Not on file  Social Needs  . Financial resource strain: Not on file  . Food insecurity:    Worry: Not on file    Inability: Not on file  . Transportation needs:    Medical: Not on file    Non-medical: Not on file  Tobacco Use  .  Smoking status: Former Smoker    Packs/day: 1.00    Years: 46.00    Pack years: 46.00    Types: Cigarettes    Start date: 02/04/1967    Last attempt to quit: 02/03/2013    Years since quitting: 4.8  . Smokeless tobacco: Never Used  Substance and Sexual Activity  . Alcohol use: No    Alcohol/week: 0.0 standard drinks  . Drug use: No  . Sexual activity: Not Currently  Lifestyle  . Physical activity:    Days per week: Not on file    Minutes per session: Not on file  . Stress: Not on file  Relationships  . Social connections:    Talks on phone: Not on file    Gets together: Not  on file    Attends religious service: Not on file    Active member of club or organization: Not on file    Attends meetings of clubs or organizations: Not on file    Relationship status: Not on file  . Intimate partner violence:    Fear of current or ex partner: Not on file    Emotionally abused: Not on file    Physically abused: Not on file    Forced sexual activity: Not on file  Other Topics Concern  . Not on file  Social History Narrative   Lives with sister, Leonard [name] in [place].  Retired - former Sports coach.   Patient in skilled nursing facility   Family History  Problem Relation Age of Onset  . Cancer Mother        lung cancer  . Hypertension Mother   . Cancer Sister        patient thinks it was uterine cancer  . Hypertension Sister   . Heart attack Sister   . Stroke Neg Hx     Current Outpatient Medications  Medication Sig Dispense Refill  . acetaminophen (TYLENOL) 325 MG tablet Take 650 mg by mouth every 8 (eight) hours as needed for mild pain or fever.     Marland Kitchen aspirin EC 81 MG tablet Take 1 tablet (81 mg total) by mouth daily. 150 tablet 0  . atorvastatin (LIPITOR) 40 MG tablet TAKE ONE (1) TABLET BY MOUTH EVERY DAY AT 6:00PM (Patient taking differently: Take 40 mg by mouth daily at 6 PM. ) 90 tablet 3  . Blood Glucose Monitoring Suppl (ONETOUCH VERIO FLEX SYSTEM) w/Device KIT 1 kit by Does not apply route daily. 1 kit 3  . carvedilol (COREG) 12.5 MG tablet Take 1 tablet (12.5 mg total) by mouth 2 (two) times daily with a meal. 60 tablet 0  . cloNIDine (CATAPRES - DOSED IN MG/24 HR) 0.2 mg/24hr patch Place 1 patch (0.2 mg total) onto the skin once a week. (Patient taking differently: Place 0.2 mg onto the skin every Monday. ) 4 patch 0  . Darbepoetin Alfa (ARANESP) 40 MCG/0.4ML SOSY injection Inject 0.4 mLs (40 mcg total) into the skin every 30 (thirty) days. 8.4 mL 2  . doxazosin (CARDURA) 8 MG tablet Take 1 tablet (8 mg total) by mouth every evening.    . feeding  supplement, ENSURE ENLIVE, (ENSURE ENLIVE) LIQD Take 237 mLs by mouth 3 (three) times daily between meals. 237 mL 12  . ferrous sulfate (FEROSUL) 325 (65 FE) MG tablet Take 1 tablet (325 mg total) by mouth daily at 12 noon. 30 tablet 0  . glucose blood (ONETOUCH VERIO) test strip Use as instructed to test three times daily. ICD-10  code: E11.22 100 each 12  . hydrALAZINE (APRESOLINE) 100 MG tablet TAKE ONE TABLET BY MOUTH EVERY EIGHT HOURS (Patient taking differently: Take 100 mg by mouth every 8 (eight) hours. ) 90 tablet 1  . ipratropium-albuterol (DUONEB) 0.5-2.5 (3) MG/3ML SOLN Take 3 mLs by nebulization 4 (four) times daily as needed (shortness of breath and wheezing).     . isosorbide mononitrate (IMDUR) 120 MG 24 hr tablet Take 1 tablet (120 mg total) by mouth daily. 30 tablet 0  . levothyroxine (SYNTHROID, LEVOTHROID) 100 MCG tablet Take 100 mcg by mouth daily before breakfast.     . LONHALA MAGNAIR STARTER KIT 25 MCG/ML SOLN Inhale 1 mL into the lungs 2 (two) times daily.    . nitroGLYCERIN (NITROSTAT) 0.4 MG SL tablet Place 1 tablet (0.4 mg total) under the tongue every 5 (five) minutes as needed for chest pain. 30 tablet 12  . NOVOLOG 100 UNIT/ML injection     . omeprazole (PRILOSEC) 20 MG capsule     . ONETOUCH DELICA LANCETS 03O MISC 1 each by Does not apply route 3 (three) times daily. ICD-10 code: E11.22 100 each 12  . polyethylene glycol (MIRALAX / GLYCOLAX) packet Take 17 g by mouth daily as needed for mild constipation or moderate constipation. (Patient taking differently: Take 17 g by mouth every 8 (eight) hours as needed for mild constipation. ) 14 each 0   No current facility-administered medications for this encounter.    Facility-Administered Medications Ordered in Other Encounters  Medication Dose Route Frequency Provider Last Rate Last Dose  . sodium chloride flush (NS) 0.9 % injection 10 mL  10 mL Intracatheter PRN Heath Lark, MD       Vitals:   12/03/17 0916  BP: (!)  158/62  Pulse: 69  SpO2: 92%  Weight: 81.9 kg (180 lb 9.6 oz)    Wt Readings from Last 3 Encounters:  12/03/17 81.9 kg (180 lb 9.6 oz)  11/10/17 79.7 kg (175 lb 9.6 oz)  10/20/17 70.8 kg (156 lb)   Physical Exam General: Fatigued appearing. No resp difficulty. HEENT: Normal Neck: Supple. JVP to jaw. Carotids 2+ bilat; no bruits. No thyromegaly or nodule noted. Cor: PMI nondisplaced. RRR, No M/G/R noted Lungs: CTAB, normal effort. Abdomen: Soft, non-tender, non-distended, no HSM. No bruits or masses. +BS  Extremities: No cyanosis, clubbing, or rash. Left AKA. RLE with 1+ edema above socks.  Neuro: Alert & orientedx3, cranial nerves grossly intact. moves all 4 extremities w/o difficulty. Affect pleasant   Assessment/Plan: 1. Chronic diastolic CHF:  - Echo 02/2246: EF 65-70%, grade 2 DD, mild TR, PA peak pressure 46 mmHg - Echo 09/2017: EF 50-55%, LA dilated, RV normal - NYHA III symptoms chronically. Confounded by deconditioning and Left AKA.  - Volume status elevated on exam - Increase torsemide to 80 mg BID x 2 days, then 80 mg q am and 40 mg q pm.  - Continue torsemide 80 mg daily.  - Not on spiro with CKD. - Continue TED hose to RLE - Reinforced fluid restriction to < 2 L daily, sodium restriction to less than 2000 mg daily, and the importance of daily weights.   2. HTN:  - Continue on hydralazine 100 mg TID, imdur 120 mg daily, cardura 8 mg daily, coreg 12.5 mg BID, and clonidine patch 0.2 mg weekly.  - With volume overload, will not adjust BP meds at this time.  3. CAD s.p CABG - No s/s of ischemia.      -  Continue ASA, statin and Coreg.  - Elevated troponin (peak 3.98) during recent admission. Myoview 09/25/17: EF 45%, no ischemia 4. COPD: Gold stage III   - Severe obstruction on prior PFTs. No longer on O2 at home.  - Stable. No change.  - Follows with Dr. Melvyn Novas. Weaned off O2 while inpatient. Encouraged him to follow up with pulmonary.  5. CKD: Stage V.-  L AVF.  -  Follows with Dr Marval Regal.  - Baseline creatinine 2.0 - 2.2 (?) - BMET and BNP today.  6. Hx of DVT - Diagnosed 08/30/16 - No longer on AC.  No change.  7. Hx of pleural effusions R>L - Stable on exam.  8. Extensive PAD - s/p L AKA for extensive LLE wounds with no re-vascularization options in 03/2017 by Dr. Donzetta Matters. No change.  9. Anemia - Denies bleeding. 10. Pulmonary HTN - PA peak pressure 63 mmHg on Echo 07/20/17 - Suspect primarily WHO Group III and WHO Group II pulmonary HTN. Suspect he has mild/modPulm Hypertensionat baselineand pressures wereelevated in the setting of volume overload.  - No change to current plan.   11. Deconditioning - PT/OT as tolerated  Worry that he will end up needing dialysis sooner rather than later with continued volume overload. Will adjust meds. Facility has instructions to call if weight gain of 3 lbs overnight, or 5 lbs within one week. RTC 6-8 weeks. Sooner with symptoms.  Shirley Friar, PA-C 12/03/2017   Greater than 50% of the 25 minute visit was spent in counseling/coordination of care regarding disease state education, salt/fluid restriction, sliding scale diuretics, and medication compliance.

## 2017-12-03 NOTE — Patient Instructions (Addendum)
Today you have been seen at the Heart failure clinic at [facility]   Medication changes: Take extra Torsemide 80 mg twice a day for 2 days Then start torsemide 80 mg in the morning and 40mg  in the PM   You had Lab work done today:  Scheduled Lab work: needs repeat BMET in 2 weeks  We will call you if your lab work is abnormal.. No news is good news!!   Follow up with Dr. Aundra Dubin 02/16/18 at 9:00 AM   Do the following things EVERYDAY: 1) Weigh yourself in the morning before breakfast. Write it down and keep it in a log. 2) Take your medicines as prescribed 3) Eat low salt foods-Limit salt (sodium) to 2000 mg per day.  4) Stay as active as you can everyday 5) Limit all fluids for the day to less than 2 liters

## 2017-12-10 DIAGNOSIS — D649 Anemia, unspecified: Secondary | ICD-10-CM | POA: Diagnosis not present

## 2017-12-10 DIAGNOSIS — I509 Heart failure, unspecified: Secondary | ICD-10-CM | POA: Diagnosis not present

## 2017-12-11 DIAGNOSIS — Z79899 Other long term (current) drug therapy: Secondary | ICD-10-CM | POA: Diagnosis not present

## 2017-12-11 DIAGNOSIS — D649 Anemia, unspecified: Secondary | ICD-10-CM | POA: Diagnosis not present

## 2017-12-18 DIAGNOSIS — Z79899 Other long term (current) drug therapy: Secondary | ICD-10-CM | POA: Diagnosis not present

## 2017-12-18 DIAGNOSIS — I509 Heart failure, unspecified: Secondary | ICD-10-CM | POA: Diagnosis not present

## 2017-12-18 DIAGNOSIS — D649 Anemia, unspecified: Secondary | ICD-10-CM | POA: Diagnosis not present

## 2017-12-22 DIAGNOSIS — Z79899 Other long term (current) drug therapy: Secondary | ICD-10-CM | POA: Diagnosis not present

## 2017-12-22 DIAGNOSIS — D649 Anemia, unspecified: Secondary | ICD-10-CM | POA: Diagnosis not present

## 2017-12-29 DIAGNOSIS — I509 Heart failure, unspecified: Secondary | ICD-10-CM | POA: Diagnosis not present

## 2017-12-29 DIAGNOSIS — E039 Hypothyroidism, unspecified: Secondary | ICD-10-CM | POA: Diagnosis not present

## 2017-12-29 DIAGNOSIS — I150 Renovascular hypertension: Secondary | ICD-10-CM | POA: Diagnosis not present

## 2017-12-29 DIAGNOSIS — E088 Diabetes mellitus due to underlying condition with unspecified complications: Secondary | ICD-10-CM | POA: Diagnosis not present

## 2017-12-30 DIAGNOSIS — N184 Chronic kidney disease, stage 4 (severe): Secondary | ICD-10-CM | POA: Diagnosis not present

## 2017-12-30 DIAGNOSIS — I509 Heart failure, unspecified: Secondary | ICD-10-CM | POA: Diagnosis not present

## 2017-12-30 DIAGNOSIS — D649 Anemia, unspecified: Secondary | ICD-10-CM | POA: Diagnosis not present

## 2017-12-30 DIAGNOSIS — E039 Hypothyroidism, unspecified: Secondary | ICD-10-CM | POA: Diagnosis not present

## 2017-12-30 DIAGNOSIS — E1165 Type 2 diabetes mellitus with hyperglycemia: Secondary | ICD-10-CM | POA: Diagnosis not present

## 2017-12-30 DIAGNOSIS — I150 Renovascular hypertension: Secondary | ICD-10-CM | POA: Diagnosis not present

## 2017-12-30 DIAGNOSIS — D500 Iron deficiency anemia secondary to blood loss (chronic): Secondary | ICD-10-CM | POA: Diagnosis not present

## 2018-01-05 DIAGNOSIS — I739 Peripheral vascular disease, unspecified: Secondary | ICD-10-CM | POA: Diagnosis not present

## 2018-01-05 DIAGNOSIS — R600 Localized edema: Secondary | ICD-10-CM | POA: Diagnosis not present

## 2018-01-05 DIAGNOSIS — Q845 Enlarged and hypertrophic nails: Secondary | ICD-10-CM | POA: Diagnosis not present

## 2018-01-05 DIAGNOSIS — L603 Nail dystrophy: Secondary | ICD-10-CM | POA: Diagnosis not present

## 2018-01-05 DIAGNOSIS — B351 Tinea unguium: Secondary | ICD-10-CM | POA: Diagnosis not present

## 2018-01-18 DIAGNOSIS — N189 Chronic kidney disease, unspecified: Secondary | ICD-10-CM | POA: Diagnosis not present

## 2018-01-18 DIAGNOSIS — E1152 Type 2 diabetes mellitus with diabetic peripheral angiopathy with gangrene: Secondary | ICD-10-CM | POA: Diagnosis not present

## 2018-01-18 DIAGNOSIS — D696 Thrombocytopenia, unspecified: Secondary | ICD-10-CM | POA: Diagnosis not present

## 2018-01-18 DIAGNOSIS — I5030 Unspecified diastolic (congestive) heart failure: Secondary | ICD-10-CM | POA: Diagnosis not present

## 2018-01-18 DIAGNOSIS — F17201 Nicotine dependence, unspecified, in remission: Secondary | ICD-10-CM | POA: Diagnosis not present

## 2018-01-18 DIAGNOSIS — J449 Chronic obstructive pulmonary disease, unspecified: Secondary | ICD-10-CM | POA: Diagnosis not present

## 2018-01-18 DIAGNOSIS — I2510 Atherosclerotic heart disease of native coronary artery without angina pectoris: Secondary | ICD-10-CM | POA: Diagnosis not present

## 2018-01-18 DIAGNOSIS — D631 Anemia in chronic kidney disease: Secondary | ICD-10-CM | POA: Diagnosis not present

## 2018-01-18 DIAGNOSIS — N2581 Secondary hyperparathyroidism of renal origin: Secondary | ICD-10-CM | POA: Diagnosis not present

## 2018-01-18 DIAGNOSIS — I129 Hypertensive chronic kidney disease with stage 1 through stage 4 chronic kidney disease, or unspecified chronic kidney disease: Secondary | ICD-10-CM | POA: Diagnosis not present

## 2018-01-18 DIAGNOSIS — N184 Chronic kidney disease, stage 4 (severe): Secondary | ICD-10-CM | POA: Diagnosis not present

## 2018-01-18 DIAGNOSIS — E1122 Type 2 diabetes mellitus with diabetic chronic kidney disease: Secondary | ICD-10-CM | POA: Diagnosis not present

## 2018-01-18 DIAGNOSIS — N179 Acute kidney failure, unspecified: Secondary | ICD-10-CM | POA: Diagnosis not present

## 2018-01-21 ENCOUNTER — Other Ambulatory Visit: Payer: Self-pay | Admitting: Hematology and Oncology

## 2018-01-21 ENCOUNTER — Telehealth: Payer: Self-pay | Admitting: *Deleted

## 2018-01-21 NOTE — Telephone Encounter (Signed)
Telephone call to SNF spoke to nurse. Patient unavailable. Aranesp injections were given at the facility but discontinued following last hospital admission. Per last provider note patient was to follow up with hematology hgb 7.4. Appt scheduled for 12/30 at 1130/lab 12/NG. Message left on transportation schedulers voicemail.

## 2018-01-26 DIAGNOSIS — D500 Iron deficiency anemia secondary to blood loss (chronic): Secondary | ICD-10-CM | POA: Diagnosis not present

## 2018-01-26 DIAGNOSIS — I150 Renovascular hypertension: Secondary | ICD-10-CM | POA: Diagnosis not present

## 2018-01-26 DIAGNOSIS — E039 Hypothyroidism, unspecified: Secondary | ICD-10-CM | POA: Diagnosis not present

## 2018-01-26 DIAGNOSIS — I739 Peripheral vascular disease, unspecified: Secondary | ICD-10-CM | POA: Diagnosis not present

## 2018-01-28 DIAGNOSIS — E039 Hypothyroidism, unspecified: Secondary | ICD-10-CM | POA: Diagnosis not present

## 2018-01-28 DIAGNOSIS — E088 Diabetes mellitus due to underlying condition with unspecified complications: Secondary | ICD-10-CM | POA: Diagnosis not present

## 2018-01-28 DIAGNOSIS — I150 Renovascular hypertension: Secondary | ICD-10-CM | POA: Diagnosis not present

## 2018-01-28 DIAGNOSIS — I509 Heart failure, unspecified: Secondary | ICD-10-CM | POA: Diagnosis not present

## 2018-02-01 ENCOUNTER — Inpatient Hospital Stay: Payer: Medicare Other | Attending: Hematology and Oncology | Admitting: Hematology and Oncology

## 2018-02-01 ENCOUNTER — Inpatient Hospital Stay: Payer: Medicare Other

## 2018-02-01 ENCOUNTER — Encounter: Payer: Self-pay | Admitting: Hematology and Oncology

## 2018-02-03 DIAGNOSIS — M6281 Muscle weakness (generalized): Secondary | ICD-10-CM | POA: Diagnosis not present

## 2018-02-03 DIAGNOSIS — I509 Heart failure, unspecified: Secondary | ICD-10-CM | POA: Diagnosis not present

## 2018-02-03 DIAGNOSIS — E088 Diabetes mellitus due to underlying condition with unspecified complications: Secondary | ICD-10-CM | POA: Diagnosis not present

## 2018-02-03 DIAGNOSIS — N184 Chronic kidney disease, stage 4 (severe): Secondary | ICD-10-CM | POA: Diagnosis not present

## 2018-02-03 DIAGNOSIS — I5032 Chronic diastolic (congestive) heart failure: Secondary | ICD-10-CM | POA: Diagnosis not present

## 2018-02-03 DIAGNOSIS — R293 Abnormal posture: Secondary | ICD-10-CM | POA: Diagnosis not present

## 2018-02-03 DIAGNOSIS — R262 Difficulty in walking, not elsewhere classified: Secondary | ICD-10-CM | POA: Diagnosis not present

## 2018-02-03 DIAGNOSIS — J09X2 Influenza due to identified novel influenza A virus with other respiratory manifestations: Secondary | ICD-10-CM | POA: Diagnosis not present

## 2018-02-04 DIAGNOSIS — R262 Difficulty in walking, not elsewhere classified: Secondary | ICD-10-CM | POA: Diagnosis not present

## 2018-02-04 DIAGNOSIS — I5032 Chronic diastolic (congestive) heart failure: Secondary | ICD-10-CM | POA: Diagnosis not present

## 2018-02-04 DIAGNOSIS — I509 Heart failure, unspecified: Secondary | ICD-10-CM | POA: Diagnosis not present

## 2018-02-04 DIAGNOSIS — E088 Diabetes mellitus due to underlying condition with unspecified complications: Secondary | ICD-10-CM | POA: Diagnosis not present

## 2018-02-04 DIAGNOSIS — M6281 Muscle weakness (generalized): Secondary | ICD-10-CM | POA: Diagnosis not present

## 2018-02-04 DIAGNOSIS — J09X2 Influenza due to identified novel influenza A virus with other respiratory manifestations: Secondary | ICD-10-CM | POA: Diagnosis not present

## 2018-02-05 DIAGNOSIS — E088 Diabetes mellitus due to underlying condition with unspecified complications: Secondary | ICD-10-CM | POA: Diagnosis not present

## 2018-02-05 DIAGNOSIS — M6281 Muscle weakness (generalized): Secondary | ICD-10-CM | POA: Diagnosis not present

## 2018-02-05 DIAGNOSIS — R262 Difficulty in walking, not elsewhere classified: Secondary | ICD-10-CM | POA: Diagnosis not present

## 2018-02-05 DIAGNOSIS — I5032 Chronic diastolic (congestive) heart failure: Secondary | ICD-10-CM | POA: Diagnosis not present

## 2018-02-05 DIAGNOSIS — I509 Heart failure, unspecified: Secondary | ICD-10-CM | POA: Diagnosis not present

## 2018-02-05 DIAGNOSIS — J09X2 Influenza due to identified novel influenza A virus with other respiratory manifestations: Secondary | ICD-10-CM | POA: Diagnosis not present

## 2018-02-06 DIAGNOSIS — J09X2 Influenza due to identified novel influenza A virus with other respiratory manifestations: Secondary | ICD-10-CM | POA: Diagnosis not present

## 2018-02-06 DIAGNOSIS — I5032 Chronic diastolic (congestive) heart failure: Secondary | ICD-10-CM | POA: Diagnosis not present

## 2018-02-06 DIAGNOSIS — R262 Difficulty in walking, not elsewhere classified: Secondary | ICD-10-CM | POA: Diagnosis not present

## 2018-02-06 DIAGNOSIS — E088 Diabetes mellitus due to underlying condition with unspecified complications: Secondary | ICD-10-CM | POA: Diagnosis not present

## 2018-02-06 DIAGNOSIS — I509 Heart failure, unspecified: Secondary | ICD-10-CM | POA: Diagnosis not present

## 2018-02-06 DIAGNOSIS — M6281 Muscle weakness (generalized): Secondary | ICD-10-CM | POA: Diagnosis not present

## 2018-02-09 DIAGNOSIS — I509 Heart failure, unspecified: Secondary | ICD-10-CM | POA: Diagnosis not present

## 2018-02-09 DIAGNOSIS — R262 Difficulty in walking, not elsewhere classified: Secondary | ICD-10-CM | POA: Diagnosis not present

## 2018-02-09 DIAGNOSIS — E088 Diabetes mellitus due to underlying condition with unspecified complications: Secondary | ICD-10-CM | POA: Diagnosis not present

## 2018-02-09 DIAGNOSIS — M6281 Muscle weakness (generalized): Secondary | ICD-10-CM | POA: Diagnosis not present

## 2018-02-09 DIAGNOSIS — I5032 Chronic diastolic (congestive) heart failure: Secondary | ICD-10-CM | POA: Diagnosis not present

## 2018-02-09 DIAGNOSIS — J09X2 Influenza due to identified novel influenza A virus with other respiratory manifestations: Secondary | ICD-10-CM | POA: Diagnosis not present

## 2018-02-10 DIAGNOSIS — J09X2 Influenza due to identified novel influenza A virus with other respiratory manifestations: Secondary | ICD-10-CM | POA: Diagnosis not present

## 2018-02-10 DIAGNOSIS — I5032 Chronic diastolic (congestive) heart failure: Secondary | ICD-10-CM | POA: Diagnosis not present

## 2018-02-10 DIAGNOSIS — I509 Heart failure, unspecified: Secondary | ICD-10-CM | POA: Diagnosis not present

## 2018-02-10 DIAGNOSIS — R262 Difficulty in walking, not elsewhere classified: Secondary | ICD-10-CM | POA: Diagnosis not present

## 2018-02-10 DIAGNOSIS — E088 Diabetes mellitus due to underlying condition with unspecified complications: Secondary | ICD-10-CM | POA: Diagnosis not present

## 2018-02-10 DIAGNOSIS — M6281 Muscle weakness (generalized): Secondary | ICD-10-CM | POA: Diagnosis not present

## 2018-02-11 DIAGNOSIS — I509 Heart failure, unspecified: Secondary | ICD-10-CM | POA: Diagnosis not present

## 2018-02-11 DIAGNOSIS — R262 Difficulty in walking, not elsewhere classified: Secondary | ICD-10-CM | POA: Diagnosis not present

## 2018-02-11 DIAGNOSIS — E088 Diabetes mellitus due to underlying condition with unspecified complications: Secondary | ICD-10-CM | POA: Diagnosis not present

## 2018-02-11 DIAGNOSIS — J09X2 Influenza due to identified novel influenza A virus with other respiratory manifestations: Secondary | ICD-10-CM | POA: Diagnosis not present

## 2018-02-11 DIAGNOSIS — I5032 Chronic diastolic (congestive) heart failure: Secondary | ICD-10-CM | POA: Diagnosis not present

## 2018-02-11 DIAGNOSIS — M6281 Muscle weakness (generalized): Secondary | ICD-10-CM | POA: Diagnosis not present

## 2018-02-12 DIAGNOSIS — M6281 Muscle weakness (generalized): Secondary | ICD-10-CM | POA: Diagnosis not present

## 2018-02-12 DIAGNOSIS — I5032 Chronic diastolic (congestive) heart failure: Secondary | ICD-10-CM | POA: Diagnosis not present

## 2018-02-12 DIAGNOSIS — J09X2 Influenza due to identified novel influenza A virus with other respiratory manifestations: Secondary | ICD-10-CM | POA: Diagnosis not present

## 2018-02-12 DIAGNOSIS — I509 Heart failure, unspecified: Secondary | ICD-10-CM | POA: Diagnosis not present

## 2018-02-12 DIAGNOSIS — R262 Difficulty in walking, not elsewhere classified: Secondary | ICD-10-CM | POA: Diagnosis not present

## 2018-02-12 DIAGNOSIS — E088 Diabetes mellitus due to underlying condition with unspecified complications: Secondary | ICD-10-CM | POA: Diagnosis not present

## 2018-02-13 DIAGNOSIS — E088 Diabetes mellitus due to underlying condition with unspecified complications: Secondary | ICD-10-CM | POA: Diagnosis not present

## 2018-02-13 DIAGNOSIS — R262 Difficulty in walking, not elsewhere classified: Secondary | ICD-10-CM | POA: Diagnosis not present

## 2018-02-13 DIAGNOSIS — M6281 Muscle weakness (generalized): Secondary | ICD-10-CM | POA: Diagnosis not present

## 2018-02-13 DIAGNOSIS — J09X2 Influenza due to identified novel influenza A virus with other respiratory manifestations: Secondary | ICD-10-CM | POA: Diagnosis not present

## 2018-02-13 DIAGNOSIS — I509 Heart failure, unspecified: Secondary | ICD-10-CM | POA: Diagnosis not present

## 2018-02-13 DIAGNOSIS — I5032 Chronic diastolic (congestive) heart failure: Secondary | ICD-10-CM | POA: Diagnosis not present

## 2018-02-15 ENCOUNTER — Telehealth (HOSPITAL_COMMUNITY): Payer: Self-pay

## 2018-02-15 NOTE — Telephone Encounter (Signed)
Open in error

## 2018-02-16 ENCOUNTER — Encounter (HOSPITAL_COMMUNITY): Payer: Self-pay | Admitting: Cardiology

## 2018-02-16 ENCOUNTER — Ambulatory Visit (HOSPITAL_COMMUNITY)
Admission: RE | Admit: 2018-02-16 | Discharge: 2018-02-16 | Disposition: A | Discharge status: Home / Self Care | Payer: Medicare Other | Source: Ambulatory Visit | Attending: Cardiology | Admitting: Cardiology

## 2018-02-16 ENCOUNTER — Telehealth (HOSPITAL_COMMUNITY): Payer: Self-pay

## 2018-02-16 VITALS — BP 184/90 | HR 92 | Wt 175.6 lb

## 2018-02-16 DIAGNOSIS — I509 Heart failure, unspecified: Secondary | ICD-10-CM | POA: Diagnosis not present

## 2018-02-16 DIAGNOSIS — Z89612 Acquired absence of left leg above knee: Secondary | ICD-10-CM | POA: Insufficient documentation

## 2018-02-16 DIAGNOSIS — E1151 Type 2 diabetes mellitus with diabetic peripheral angiopathy without gangrene: Secondary | ICD-10-CM | POA: Insufficient documentation

## 2018-02-16 DIAGNOSIS — F17210 Nicotine dependence, cigarettes, uncomplicated: Secondary | ICD-10-CM | POA: Insufficient documentation

## 2018-02-16 DIAGNOSIS — E785 Hyperlipidemia, unspecified: Secondary | ICD-10-CM | POA: Diagnosis not present

## 2018-02-16 DIAGNOSIS — J09X2 Influenza due to identified novel influenza A virus with other respiratory manifestations: Secondary | ICD-10-CM | POA: Diagnosis not present

## 2018-02-16 DIAGNOSIS — R0989 Other specified symptoms and signs involving the circulatory and respiratory systems: Secondary | ICD-10-CM | POA: Diagnosis not present

## 2018-02-16 DIAGNOSIS — Z8249 Family history of ischemic heart disease and other diseases of the circulatory system: Secondary | ICD-10-CM | POA: Diagnosis not present

## 2018-02-16 DIAGNOSIS — Z79890 Hormone replacement therapy (postmenopausal): Secondary | ICD-10-CM | POA: Insufficient documentation

## 2018-02-16 DIAGNOSIS — Z7982 Long term (current) use of aspirin: Secondary | ICD-10-CM | POA: Diagnosis not present

## 2018-02-16 DIAGNOSIS — N184 Chronic kidney disease, stage 4 (severe): Secondary | ICD-10-CM | POA: Diagnosis not present

## 2018-02-16 DIAGNOSIS — E1122 Type 2 diabetes mellitus with diabetic chronic kidney disease: Secondary | ICD-10-CM | POA: Insufficient documentation

## 2018-02-16 DIAGNOSIS — E088 Diabetes mellitus due to underlying condition with unspecified complications: Secondary | ICD-10-CM | POA: Diagnosis not present

## 2018-02-16 DIAGNOSIS — R262 Difficulty in walking, not elsewhere classified: Secondary | ICD-10-CM | POA: Diagnosis not present

## 2018-02-16 DIAGNOSIS — Z86718 Personal history of other venous thrombosis and embolism: Secondary | ICD-10-CM | POA: Diagnosis not present

## 2018-02-16 DIAGNOSIS — I130 Hypertensive heart and chronic kidney disease with heart failure and stage 1 through stage 4 chronic kidney disease, or unspecified chronic kidney disease: Secondary | ICD-10-CM | POA: Insufficient documentation

## 2018-02-16 DIAGNOSIS — G4733 Obstructive sleep apnea (adult) (pediatric): Secondary | ICD-10-CM | POA: Diagnosis not present

## 2018-02-16 DIAGNOSIS — Z794 Long term (current) use of insulin: Secondary | ICD-10-CM | POA: Insufficient documentation

## 2018-02-16 DIAGNOSIS — I2510 Atherosclerotic heart disease of native coronary artery without angina pectoris: Secondary | ICD-10-CM | POA: Insufficient documentation

## 2018-02-16 DIAGNOSIS — I272 Pulmonary hypertension, unspecified: Secondary | ICD-10-CM | POA: Insufficient documentation

## 2018-02-16 DIAGNOSIS — Z951 Presence of aortocoronary bypass graft: Secondary | ICD-10-CM | POA: Diagnosis not present

## 2018-02-16 DIAGNOSIS — I739 Peripheral vascular disease, unspecified: Secondary | ICD-10-CM

## 2018-02-16 DIAGNOSIS — Z79899 Other long term (current) drug therapy: Secondary | ICD-10-CM | POA: Diagnosis not present

## 2018-02-16 DIAGNOSIS — E039 Hypothyroidism, unspecified: Secondary | ICD-10-CM | POA: Insufficient documentation

## 2018-02-16 DIAGNOSIS — I5032 Chronic diastolic (congestive) heart failure: Secondary | ICD-10-CM | POA: Diagnosis not present

## 2018-02-16 DIAGNOSIS — J449 Chronic obstructive pulmonary disease, unspecified: Secondary | ICD-10-CM | POA: Diagnosis not present

## 2018-02-16 DIAGNOSIS — M6281 Muscle weakness (generalized): Secondary | ICD-10-CM | POA: Diagnosis not present

## 2018-02-16 LAB — LIPID PANEL
CHOLESTEROL: 95 mg/dL (ref 0–200)
HDL: 23 mg/dL — ABNORMAL LOW (ref >40)
LDL Cholesterol: 37 mg/dL (ref 0–99)
Total CHOL/HDL Ratio: 4.1 RATIO
Triglycerides: 173 mg/dL — ABNORMAL HIGH (ref <150)
VLDL: 35 mg/dL (ref 0–40)

## 2018-02-16 LAB — BASIC METABOLIC PANEL
Anion gap: 10 (ref 5–15)
BUN: 66 mg/dL — AB (ref 8–23)
CO2: 23 mmol/L (ref 22–32)
Calcium: 8.8 mg/dL — ABNORMAL LOW (ref 8.9–10.3)
Chloride: 108 mmol/L (ref 98–111)
Creatinine, Ser: 3.36 mg/dL — ABNORMAL HIGH (ref 0.61–1.24)
GFR calc Af Amer: 21 mL/min — ABNORMAL LOW (ref >60)
GFR calc non Af Amer: 18 mL/min — ABNORMAL LOW (ref >60)
Glucose, Bld: 205 mg/dL — ABNORMAL HIGH (ref 70–99)
Potassium: 4.4 mmol/L (ref 3.5–5.1)
SODIUM: 141 mmol/L (ref 135–145)

## 2018-02-16 MED ORDER — TORSEMIDE 20 MG PO TABS: 80.0000 mg | ORAL_TABLET | Freq: Every day | ORAL | 11 refills | Status: DC

## 2018-02-16 MED ORDER — TORSEMIDE 20 MG PO TABS: 40.0000 mg | ORAL_TABLET | Freq: Every day | ORAL | 11 refills | Status: DC

## 2018-02-16 MED ORDER — POTASSIUM CHLORIDE CRYS ER 20 MEQ PO TBCR: 40.0000 meq | EXTENDED_RELEASE_TABLET | Freq: Every day | ORAL | 11 refills | Status: DC

## 2018-02-16 NOTE — Telephone Encounter (Signed)
Called to speak to pt nurse no answer, Creatinine higher. Decrease torsemide down to 40 mg daily (do not increase as asked to do today). BMET 1 week  Will fax new script to [facility]

## 2018-02-16 NOTE — Addendum Note (Signed)
Encounter addended by: Larey Dresser, MD on: 02/16/2018 10:33 PM  Actions taken: Clinical Note Signed, Visit diagnoses modified, LOS modified

## 2018-02-16 NOTE — Progress Notes (Signed)
Advanced Heart Failure Clinic Note  PCP: Dr. Juanito Doom Cardiology: Dr. Aundra Dubin Nephrology: Dr Marval Regal.   Leonard [name] is a 68 y.o. male with history of CAD s/p CABG, PAD with left AKA in 1/61, chronic diastolic CHF, CKD stage IV, COPD on home oxygen, and HTN presents for cardiology followup.  He has had multiple admissions this year for diastolic CHF.  Last echo in 5/17 showed EF 60-65% with grade II diastolic dysfunction.  He also has COPD with PFTs showing severe obstruction.    Admitted 7/23-7/28/19 with A/C HF. Initial poor response to IV lasix with worsening creatinine. HF team consulted. Diuresed 20 lbs with high dose lasix and metolazone. AKI resolved prior to discharge. Transitioned to torsemide 40 mg daily (previously on 20 mg daily). Chest CT showed large right pleural effusion. Did not get thoracentesis. He was also started on aranesp for anemia. DC weight: 164 lbs. DCd to SNF.   Admitted 8/21-8/29/19 with A/C HF. He was initially hypoxic with AMS, but improved with BiPAP and diuresis. Diuresed with IV lasix. He was initially transitioned to torsemide 80 mg daily, but had mild AKI, so was decreased to torsemide 80 mg every other day He had positive troponins (peaked at 4). Myoview showed no ischemia. Palliative consulted and he remained full code. He received blood for anemia with no obvious source of bleeding. DC weight: 159 lbs. Discharged to SNF and remains in SNF currently.   He presents today for followup of CHF and CAD. No chest pain. He is not walking as his left leg prosthesis is too heavy for him to wear.  He is doing PT.  He wheels his wheelchair and transfers without dyspnea. He does get short of breath with heavy workout with PT.  No orthopnea/PND.   Labs (8/17): K 3.9, creatinine 2.33, HCT 28.5, LDL 46 Labs (11/17): hgb 9, K 4.4, creatinine 3, BNP 64 Labs 03/24/2016: K 3.5 Creatinine 2.48.  Labs 05/09/2016: K 4.3 Creatinine 3.13 Hgb 9.0  Labs (10/19): K 3, creatinine  2.02  PMH: 1. Chronic diastolic CHF: Multiple recent admissions.  Echo (5/17) with EF 60-65%, grade II diastolic dysfunction, normal RV size and systolic function.  - Echo (8/19): EF 50-55%, moderate-severe LVH.  2. PAD: Right femoral PCI in 12/14. Peripheral arterial dopplers in 6/17 with occluded right SFA.  He follows with [facility].  - Left AKA in 2/19.  3. HTN: Renal artery dopplers negative in 5/17.  4. Hypothyroidism 5. CKD stage IV: Follows with Dr. Marval Regal. 6. CAD: s/p CABG x 3 in 3/15.  - Myoview 09/25/17: EF 45%, no ischemia 7. OSA: Mild, not on CPAP.  8. COPD: PFTs (3/15) with FVC 77%, FEV1 47%, ratio 59%, TLC 55% => severe obstruction.  Still smokes about 3 cigarettes/day.   9. 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 Hyperlipidemia 12. Anemia of renal disease  Review of systems complete and found to be negative unless listed in HPI.    Social History   Socioeconomic History  . Marital status: Single    Spouse name: Not on file  . Number of children: Not on file  . Years of education: Not on file  . Highest education level: Not on file  Occupational History  . Not on file  Social Needs  . Financial resource strain: Not on file  . Food insecurity:    Worry: Not on file    Inability: Not on file  . Transportation needs:    Medical: Not on file  Non-medical: Not on file  Tobacco Use  . Smoking status: Former Smoker    Packs/day: 1.00    Years: 46.00    Pack years: 46.00    Types: Cigarettes    Start date: 02/04/1967    Last attempt to quit: 02/03/2013    Years since quitting: 5.0  . Smokeless tobacco: Never Used  Substance and Sexual Activity  . Alcohol use: No    Alcohol/week: 0.0 standard drinks  . Drug use: No  . Sexual activity: Not Currently  Lifestyle  . Physical activity:    Days per week: Not on file    Minutes per session: Not on file  . Stress: Not on file  Relationships  . Social connections:    Talks on phone: Not on file    Gets together: Not on  file    Attends religious service: Not on file    Active member of club or organization: Not on file    Attends meetings of clubs or organizations: Not on file    Relationship status: Not on file  . Intimate partner violence:    Fear of current or ex partner: Not on file    Emotionally abused: Not on file    Physically abused: Not on file    Forced sexual activity: Not on file  Other Topics Concern  . Not on file  Social History Narrative   Lives with sister, [name] in [place].  Retired - former Sports coach.   Patient in skilled nursing facility   Family History  Problem Relation Age of Onset  . Cancer Mother        lung cancer  . Hypertension Mother   . Cancer Sister        patient thinks it was uterine cancer  . Hypertension Sister   . Heart attack Sister   . Stroke Neg Hx     Current Outpatient Medications  Medication Sig Dispense Refill  . acetaminophen (TYLENOL) 325 MG tablet Take 650 mg by mouth every 8 (eight) hours as needed for mild pain or fever.     Marland Kitchen aspirin EC 81 MG tablet Take 1 tablet (81 mg total) by mouth daily. 150 tablet 0  . atorvastatin (LIPITOR) 40 MG tablet TAKE ONE (1) TABLET BY MOUTH EVERY DAY AT 6:00PM (Patient taking differently: Take 40 mg by mouth daily at 6 PM. ) 90 tablet 3  . Blood Glucose Monitoring Suppl (ONETOUCH VERIO FLEX SYSTEM) w/Device KIT 1 kit by Does not apply route daily. 1 kit 3  . carvedilol (COREG) 12.5 MG tablet Take 1 tablet (12.5 mg total) by mouth 2 (two) times daily with a meal. 60 tablet 0  . cloNIDine (CATAPRES - DOSED IN MG/24 HR) 0.2 mg/24hr patch Place 1 patch (0.2 mg total) onto the skin once a week. (Patient taking differently: Place 0.2 mg onto the skin every Monday. ) 4 patch 0  . Darbepoetin Alfa (ARANESP) 40 MCG/0.4ML SOSY injection Inject 0.4 mLs (40 mcg total) into the skin every 30 (thirty) days. 8.4 mL 2  . doxazosin (CARDURA) 8 MG tablet Take 1 tablet (8 mg total) by mouth every evening.    . feeding  supplement, ENSURE ENLIVE, (ENSURE ENLIVE) LIQD Take 237 mLs by mouth 3 (three) times daily between meals. 237 mL 12  . ferrous sulfate (FEROSUL) 325 (65 FE) MG tablet Take 1 tablet (325 mg total) by mouth daily at 12 noon. 30 tablet 0  . glucose blood (ONETOUCH VERIO) test strip  Use as instructed to test three times daily. ICD-10 code: E11.22 100 each 12  . hydrALAZINE (APRESOLINE) 100 MG tablet TAKE ONE TABLET BY MOUTH EVERY EIGHT HOURS (Patient taking differently: Take 100 mg by mouth every 8 (eight) hours. ) 90 tablet 1  . ipratropium-albuterol (DUONEB) 0.5-2.5 (3) MG/3ML SOLN Take 3 mLs by nebulization 4 (four) times daily as needed (shortness of breath and wheezing).     . isosorbide mononitrate (IMDUR) 120 MG 24 hr tablet Take 1 tablet (120 mg total) by mouth daily. 30 tablet 0  . levothyroxine (SYNTHROID, LEVOTHROID) 100 MCG tablet Take 100 mcg by mouth daily before breakfast.     . LONHALA MAGNAIR STARTER KIT 25 MCG/ML SOLN Inhale 1 mL into the lungs 2 (two) times daily.    Marland Kitchen NOVOLOG 100 UNIT/ML injection     . omeprazole (PRILOSEC) 20 MG capsule     . ONETOUCH DELICA LANCETS 59F MISC 1 each by Does not apply route 3 (three) times daily. ICD-10 code: E11.22 100 each 12  . polyethylene glycol (MIRALAX / GLYCOLAX) packet Take 17 g by mouth daily as needed for mild constipation or moderate constipation. (Patient taking differently: Take 17 g by mouth every 8 (eight) hours as needed for mild constipation. ) 14 each 0  . nitroGLYCERIN (NITROSTAT) 0.4 MG SL tablet Place 1 tablet (0.4 mg total) under the tongue every 5 (five) minutes as needed for chest pain. (Patient not taking: Reported on 02/16/2018) 30 tablet 12  . potassium chloride SA (K-DUR,KLOR-CON) 20 MEQ tablet Take 2 tablets (40 mEq total) by mouth daily. 60 tablet 11  . torsemide (DEMADEX) 20 MG tablet Take 2 tablets (40 mg total) by mouth daily. 120 tablet 11   No current facility-administered medications for this encounter.     Facility-Administered Medications Ordered in Other Encounters  Medication Dose Route Frequency Provider Last Rate Last Dose  . sodium chloride flush (NS) 0.9 % injection 10 mL  10 mL Intracatheter PRN Heath Lark, MD       Vitals:   02/16/18 0905  BP: (!) 184/90  Pulse: 92  SpO2: 99%  Weight: 79.7 kg (175 lb 9.6 oz)    Wt Readings from Last 3 Encounters:  02/16/18 79.7 kg (175 lb 9.6 oz)  12/03/17 81.9 kg (180 lb 9.6 oz)  11/10/17 79.7 kg (175 lb 9.6 oz)   Physical Exam General: NAD Neck: JVP 8 cm, no thyromegaly or thyroid nodule.  Lungs: Clear to auscultation bilaterally with normal respiratory effort. CV: Nondisplaced PMI.  Heart regular S1/S2, no S3/S4, no murmur.  1+ right ankle edema.  Left carotid bruit.  Unable to palpate right PT pulse.   Abdomen: Soft, nontender, no hepatosplenomegaly, no distention.  Skin: Intact without lesions or rashes.  Neurologic: Alert and oriented x 3.  Psych: Normal affect. Extremities: No clubbing or cyanosis.  Left AKA.   HEENT: Normal.    Assessment/Plan: 1. Chronic diastolic CHF:  Echo (6/38) with EF 50-55%, moderate-severe LVH.  NYHA class II symptoms though not very active.  Possible mild volume overload on exam.  Weight is actually down about 5 lbs.  - BMET today to assess K and creatinine (K was low in 10/19).  Needs to start 40 mEq KCl (was asked to do this in the fall but apparently the order was not followed at [facility]).  - If BMET is stable, would increase torsemide to 80 mg daily with repeat BMET in 10 days.  2. HTN: BP high today but  has not taken any meds yet.  HTN probably explains his LVH.  - Continue on hydralazine 100 mg TID, imdur 120 mg daily, cardura 8 mg daily, coreg 12.5 mg BID, and clonidine patch 0.2 mg weekly.  3. CAD s.p CABG: No chest pain.  Myoview 09/25/17: EF 45%, no ischemia.  - Continue ASA 81 daily.  - Continue atorvastatin, check lipids today.  4. COPD: Gold stage III. Severe obstruction on prior PFTs. No  longer on O2 at home.  Still smokes about 3 cigarettes/day.  - I strongly encouraged him to quit smoking.   5. CKD: Stage IV. Has L arm AVF. Follows with Dr Marval Regal.  - BMET today.  6. Hx of DVT: Diagnosed 08/30/16.  No longer on anticoagulation.   7. PAD: s/p L AKA for extensive LLE wounds with no re-vascularization options in 03/2017 by Dr. Donzetta Matters.  - I will arrange for peripheral arterial doppler evaluation of the right leg.  8. Carotid bruit: I will arrange for carotid dopplers.  9. Pulmonary HTN:  PA peak pressure 63 mmHg on Echo 07/20/17. Suspect primarily WHO Group III and WHO Group II pulmonary HTN.   Followup in 6 wks with NP/PA.   Loralie Champagne, MD 02/16/2018

## 2018-02-16 NOTE — Patient Instructions (Addendum)
INCREASE Torsemide to 80mg  daily  START Potassium 40 meq daily  USE Nicotine Gum to aid in quitting smoking  Our office recommends that you have Carotid Dopplers and Peripheral arterial dopplers (an ultrasound of your neck).  Radiology will call you to schedule these appointments.   Labs today We will only contact you if something comes back abnormal or we need to make some changes. Otherwise no news is good news!  Repeat BMET in 10 days. Fax results to [phone]  Your physician recommends that you schedule a follow-up appointment in: 6 weeks with NP/PA clinic

## 2018-02-17 ENCOUNTER — Telehealth (HOSPITAL_COMMUNITY): Payer: Self-pay

## 2018-02-17 DIAGNOSIS — M6281 Muscle weakness (generalized): Secondary | ICD-10-CM | POA: Diagnosis not present

## 2018-02-17 DIAGNOSIS — I509 Heart failure, unspecified: Secondary | ICD-10-CM | POA: Diagnosis not present

## 2018-02-17 DIAGNOSIS — R262 Difficulty in walking, not elsewhere classified: Secondary | ICD-10-CM | POA: Diagnosis not present

## 2018-02-17 DIAGNOSIS — I5032 Chronic diastolic (congestive) heart failure: Secondary | ICD-10-CM | POA: Diagnosis not present

## 2018-02-17 DIAGNOSIS — J09X2 Influenza due to identified novel influenza A virus with other respiratory manifestations: Secondary | ICD-10-CM | POA: Diagnosis not present

## 2018-02-17 DIAGNOSIS — E088 Diabetes mellitus due to underlying condition with unspecified complications: Secondary | ICD-10-CM | POA: Diagnosis not present

## 2018-02-17 NOTE — Telephone Encounter (Signed)
Called [facility] spoke with administrator she stated that they received new medication and lab order and she gave it to the day nurse and that she will follow up to make sure that the changes will be made

## 2018-02-18 DIAGNOSIS — E088 Diabetes mellitus due to underlying condition with unspecified complications: Secondary | ICD-10-CM | POA: Diagnosis not present

## 2018-02-18 DIAGNOSIS — R262 Difficulty in walking, not elsewhere classified: Secondary | ICD-10-CM | POA: Diagnosis not present

## 2018-02-18 DIAGNOSIS — I5032 Chronic diastolic (congestive) heart failure: Secondary | ICD-10-CM | POA: Diagnosis not present

## 2018-02-18 DIAGNOSIS — I509 Heart failure, unspecified: Secondary | ICD-10-CM | POA: Diagnosis not present

## 2018-02-18 DIAGNOSIS — J09X2 Influenza due to identified novel influenza A virus with other respiratory manifestations: Secondary | ICD-10-CM | POA: Diagnosis not present

## 2018-02-18 DIAGNOSIS — M6281 Muscle weakness (generalized): Secondary | ICD-10-CM | POA: Diagnosis not present

## 2018-02-19 DIAGNOSIS — R262 Difficulty in walking, not elsewhere classified: Secondary | ICD-10-CM | POA: Diagnosis not present

## 2018-02-19 DIAGNOSIS — I5032 Chronic diastolic (congestive) heart failure: Secondary | ICD-10-CM | POA: Diagnosis not present

## 2018-02-19 DIAGNOSIS — F4320 Adjustment disorder, unspecified: Secondary | ICD-10-CM | POA: Diagnosis not present

## 2018-02-19 DIAGNOSIS — J09X2 Influenza due to identified novel influenza A virus with other respiratory manifestations: Secondary | ICD-10-CM | POA: Diagnosis not present

## 2018-02-19 DIAGNOSIS — I509 Heart failure, unspecified: Secondary | ICD-10-CM | POA: Diagnosis not present

## 2018-02-19 DIAGNOSIS — E088 Diabetes mellitus due to underlying condition with unspecified complications: Secondary | ICD-10-CM | POA: Diagnosis not present

## 2018-02-19 DIAGNOSIS — M6281 Muscle weakness (generalized): Secondary | ICD-10-CM | POA: Diagnosis not present

## 2018-02-22 ENCOUNTER — Other Ambulatory Visit (HOSPITAL_COMMUNITY): Payer: Self-pay | Admitting: Cardiology

## 2018-02-22 DIAGNOSIS — I739 Peripheral vascular disease, unspecified: Secondary | ICD-10-CM

## 2018-02-22 DIAGNOSIS — I10 Essential (primary) hypertension: Secondary | ICD-10-CM | POA: Diagnosis not present

## 2018-02-23 DIAGNOSIS — R600 Localized edema: Secondary | ICD-10-CM | POA: Diagnosis not present

## 2018-02-23 DIAGNOSIS — Q845 Enlarged and hypertrophic nails: Secondary | ICD-10-CM | POA: Diagnosis not present

## 2018-02-23 DIAGNOSIS — I509 Heart failure, unspecified: Secondary | ICD-10-CM | POA: Diagnosis not present

## 2018-02-23 DIAGNOSIS — J101 Influenza due to other identified influenza virus with other respiratory manifestations: Secondary | ICD-10-CM | POA: Diagnosis not present

## 2018-02-23 DIAGNOSIS — L603 Nail dystrophy: Secondary | ICD-10-CM | POA: Diagnosis not present

## 2018-02-23 DIAGNOSIS — B351 Tinea unguium: Secondary | ICD-10-CM | POA: Diagnosis not present

## 2018-02-23 DIAGNOSIS — I739 Peripheral vascular disease, unspecified: Secondary | ICD-10-CM | POA: Diagnosis not present

## 2018-02-23 DIAGNOSIS — E088 Diabetes mellitus due to underlying condition with unspecified complications: Secondary | ICD-10-CM | POA: Diagnosis not present

## 2018-02-24 DIAGNOSIS — E039 Hypothyroidism, unspecified: Secondary | ICD-10-CM | POA: Diagnosis not present

## 2018-02-24 DIAGNOSIS — E1165 Type 2 diabetes mellitus with hyperglycemia: Secondary | ICD-10-CM | POA: Diagnosis not present

## 2018-02-24 DIAGNOSIS — Z79899 Other long term (current) drug therapy: Secondary | ICD-10-CM | POA: Diagnosis not present

## 2018-02-24 DIAGNOSIS — E785 Hyperlipidemia, unspecified: Secondary | ICD-10-CM | POA: Diagnosis not present

## 2018-02-24 DIAGNOSIS — I480 Paroxysmal atrial fibrillation: Secondary | ICD-10-CM | POA: Diagnosis not present

## 2018-02-24 DIAGNOSIS — D649 Anemia, unspecified: Secondary | ICD-10-CM | POA: Diagnosis not present

## 2018-02-24 DIAGNOSIS — I10 Essential (primary) hypertension: Secondary | ICD-10-CM | POA: Diagnosis not present

## 2018-02-25 DIAGNOSIS — I509 Heart failure, unspecified: Secondary | ICD-10-CM | POA: Diagnosis not present

## 2018-02-25 DIAGNOSIS — J449 Chronic obstructive pulmonary disease, unspecified: Secondary | ICD-10-CM | POA: Diagnosis not present

## 2018-02-25 DIAGNOSIS — E088 Diabetes mellitus due to underlying condition with unspecified complications: Secondary | ICD-10-CM | POA: Diagnosis not present

## 2018-02-25 DIAGNOSIS — E039 Hypothyroidism, unspecified: Secondary | ICD-10-CM | POA: Diagnosis not present

## 2018-02-26 DIAGNOSIS — D649 Anemia, unspecified: Secondary | ICD-10-CM | POA: Diagnosis not present

## 2018-02-26 DIAGNOSIS — N184 Chronic kidney disease, stage 4 (severe): Secondary | ICD-10-CM | POA: Diagnosis not present

## 2018-02-26 DIAGNOSIS — I10 Essential (primary) hypertension: Secondary | ICD-10-CM | POA: Diagnosis not present

## 2018-02-28 DIAGNOSIS — R262 Difficulty in walking, not elsewhere classified: Secondary | ICD-10-CM | POA: Diagnosis not present

## 2018-02-28 DIAGNOSIS — M6281 Muscle weakness (generalized): Secondary | ICD-10-CM | POA: Diagnosis not present

## 2018-02-28 DIAGNOSIS — I509 Heart failure, unspecified: Secondary | ICD-10-CM | POA: Diagnosis not present

## 2018-02-28 DIAGNOSIS — I5032 Chronic diastolic (congestive) heart failure: Secondary | ICD-10-CM | POA: Diagnosis not present

## 2018-02-28 DIAGNOSIS — E088 Diabetes mellitus due to underlying condition with unspecified complications: Secondary | ICD-10-CM | POA: Diagnosis not present

## 2018-02-28 DIAGNOSIS — J09X2 Influenza due to identified novel influenza A virus with other respiratory manifestations: Secondary | ICD-10-CM | POA: Diagnosis not present

## 2018-03-01 ENCOUNTER — Ambulatory Visit (HOSPITAL_COMMUNITY)
Admission: RE | Admit: 2018-03-01 | Discharge: 2018-03-01 | Disposition: A | Discharge status: Home / Self Care | Payer: Medicare Other | Source: Ambulatory Visit | Attending: Cardiovascular Disease | Admitting: Cardiovascular Disease

## 2018-03-01 ENCOUNTER — Ambulatory Visit (HOSPITAL_BASED_OUTPATIENT_CLINIC_OR_DEPARTMENT_OTHER)
Admission: RE | Admit: 2018-03-01 | Discharge: 2018-03-01 | Disposition: A | Discharge status: Home / Self Care | Payer: Medicare Other | Source: Ambulatory Visit | Attending: Cardiology | Admitting: Cardiology

## 2018-03-01 DIAGNOSIS — R262 Difficulty in walking, not elsewhere classified: Secondary | ICD-10-CM | POA: Diagnosis not present

## 2018-03-01 DIAGNOSIS — E088 Diabetes mellitus due to underlying condition with unspecified complications: Secondary | ICD-10-CM | POA: Diagnosis not present

## 2018-03-01 DIAGNOSIS — R0989 Other specified symptoms and signs involving the circulatory and respiratory systems: Secondary | ICD-10-CM

## 2018-03-01 DIAGNOSIS — I739 Peripheral vascular disease, unspecified: Secondary | ICD-10-CM | POA: Insufficient documentation

## 2018-03-01 DIAGNOSIS — M6281 Muscle weakness (generalized): Secondary | ICD-10-CM | POA: Diagnosis not present

## 2018-03-01 DIAGNOSIS — I509 Heart failure, unspecified: Secondary | ICD-10-CM | POA: Diagnosis not present

## 2018-03-01 DIAGNOSIS — I5032 Chronic diastolic (congestive) heart failure: Secondary | ICD-10-CM | POA: Diagnosis not present

## 2018-03-01 DIAGNOSIS — J09X2 Influenza due to identified novel influenza A virus with other respiratory manifestations: Secondary | ICD-10-CM | POA: Diagnosis not present

## 2018-03-02 DIAGNOSIS — R262 Difficulty in walking, not elsewhere classified: Secondary | ICD-10-CM | POA: Diagnosis not present

## 2018-03-02 DIAGNOSIS — E088 Diabetes mellitus due to underlying condition with unspecified complications: Secondary | ICD-10-CM | POA: Diagnosis not present

## 2018-03-02 DIAGNOSIS — I5032 Chronic diastolic (congestive) heart failure: Secondary | ICD-10-CM | POA: Diagnosis not present

## 2018-03-02 DIAGNOSIS — I509 Heart failure, unspecified: Secondary | ICD-10-CM | POA: Diagnosis not present

## 2018-03-02 DIAGNOSIS — M6281 Muscle weakness (generalized): Secondary | ICD-10-CM | POA: Diagnosis not present

## 2018-03-02 DIAGNOSIS — J09X2 Influenza due to identified novel influenza A virus with other respiratory manifestations: Secondary | ICD-10-CM | POA: Diagnosis not present

## 2018-03-03 ENCOUNTER — Telehealth: Payer: Self-pay | Admitting: Family Medicine

## 2018-03-03 DIAGNOSIS — R262 Difficulty in walking, not elsewhere classified: Secondary | ICD-10-CM | POA: Diagnosis not present

## 2018-03-03 DIAGNOSIS — M6281 Muscle weakness (generalized): Secondary | ICD-10-CM | POA: Diagnosis not present

## 2018-03-03 DIAGNOSIS — I5032 Chronic diastolic (congestive) heart failure: Secondary | ICD-10-CM | POA: Diagnosis not present

## 2018-03-03 DIAGNOSIS — I509 Heart failure, unspecified: Secondary | ICD-10-CM | POA: Diagnosis not present

## 2018-03-03 DIAGNOSIS — E088 Diabetes mellitus due to underlying condition with unspecified complications: Secondary | ICD-10-CM | POA: Diagnosis not present

## 2018-03-03 DIAGNOSIS — J09X2 Influenza due to identified novel influenza A virus with other respiratory manifestations: Secondary | ICD-10-CM | POA: Diagnosis not present

## 2018-03-03 NOTE — Telephone Encounter (Signed)
Opened in error

## 2018-03-04 DIAGNOSIS — M6281 Muscle weakness (generalized): Secondary | ICD-10-CM | POA: Diagnosis not present

## 2018-03-04 DIAGNOSIS — J09X2 Influenza due to identified novel influenza A virus with other respiratory manifestations: Secondary | ICD-10-CM | POA: Diagnosis not present

## 2018-03-04 DIAGNOSIS — I509 Heart failure, unspecified: Secondary | ICD-10-CM | POA: Diagnosis not present

## 2018-03-04 DIAGNOSIS — E088 Diabetes mellitus due to underlying condition with unspecified complications: Secondary | ICD-10-CM | POA: Diagnosis not present

## 2018-03-04 DIAGNOSIS — R262 Difficulty in walking, not elsewhere classified: Secondary | ICD-10-CM | POA: Diagnosis not present

## 2018-03-04 DIAGNOSIS — I5032 Chronic diastolic (congestive) heart failure: Secondary | ICD-10-CM | POA: Diagnosis not present

## 2018-03-08 DIAGNOSIS — M6281 Muscle weakness (generalized): Secondary | ICD-10-CM | POA: Diagnosis not present

## 2018-03-08 DIAGNOSIS — I509 Heart failure, unspecified: Secondary | ICD-10-CM | POA: Diagnosis not present

## 2018-03-08 DIAGNOSIS — R293 Abnormal posture: Secondary | ICD-10-CM | POA: Diagnosis not present

## 2018-03-08 DIAGNOSIS — J09X2 Influenza due to identified novel influenza A virus with other respiratory manifestations: Secondary | ICD-10-CM | POA: Diagnosis not present

## 2018-03-09 ENCOUNTER — Encounter (HOSPITAL_BASED_OUTPATIENT_CLINIC_OR_DEPARTMENT_OTHER): Payer: Self-pay | Admitting: Nephrology

## 2018-03-09 ENCOUNTER — Other Ambulatory Visit (HOSPITAL_COMMUNITY): Payer: Self-pay | Admitting: *Deleted

## 2018-03-09 DIAGNOSIS — I5032 Chronic diastolic (congestive) heart failure: Secondary | ICD-10-CM | POA: Diagnosis not present

## 2018-03-09 DIAGNOSIS — M6281 Muscle weakness (generalized): Secondary | ICD-10-CM | POA: Diagnosis not present

## 2018-03-09 DIAGNOSIS — R293 Abnormal posture: Secondary | ICD-10-CM | POA: Diagnosis not present

## 2018-03-09 DIAGNOSIS — J09X2 Influenza due to identified novel influenza A virus with other respiratory manifestations: Secondary | ICD-10-CM | POA: Diagnosis not present

## 2018-03-09 DIAGNOSIS — D649 Anemia, unspecified: Secondary | ICD-10-CM | POA: Diagnosis not present

## 2018-03-09 DIAGNOSIS — I509 Heart failure, unspecified: Secondary | ICD-10-CM | POA: Diagnosis not present

## 2018-03-10 ENCOUNTER — Ambulatory Visit (HOSPITAL_COMMUNITY)
Admission: RE | Admit: 2018-03-10 | Discharge: 2018-03-10 | Disposition: A | Discharge status: Home / Self Care | Payer: Medicare Other | Source: Ambulatory Visit | Attending: Nephrology | Admitting: Nephrology

## 2018-03-10 VITALS — BP 175/78 | HR 104 | Temp 98.6°F | Resp 20

## 2018-03-10 DIAGNOSIS — N184 Chronic kidney disease, stage 4 (severe): Secondary | ICD-10-CM

## 2018-03-10 DIAGNOSIS — I509 Heart failure, unspecified: Secondary | ICD-10-CM | POA: Diagnosis not present

## 2018-03-10 DIAGNOSIS — R293 Abnormal posture: Secondary | ICD-10-CM | POA: Diagnosis not present

## 2018-03-10 DIAGNOSIS — E118 Type 2 diabetes mellitus with unspecified complications: Secondary | ICD-10-CM

## 2018-03-10 DIAGNOSIS — M6281 Muscle weakness (generalized): Secondary | ICD-10-CM | POA: Diagnosis not present

## 2018-03-10 DIAGNOSIS — J09X2 Influenza due to identified novel influenza A virus with other respiratory manifestations: Secondary | ICD-10-CM | POA: Diagnosis not present

## 2018-03-10 LAB — POCT HEMOGLOBIN-HEMACUE: Hemoglobin: 7.9 g/dL — ABNORMAL LOW (ref 13.0–17.0)

## 2018-03-10 MED ORDER — DARBEPOETIN ALFA 40 MCG/0.4ML IJ SOSY
40.0000 ug | PREFILLED_SYRINGE | INTRAMUSCULAR | Status: DC
  Administered 2018-03-10: 40 ug via SUBCUTANEOUS

## 2018-03-10 MED ORDER — SODIUM CHLORIDE 0.9 % IV SOLN
510.0000 mg | INTRAVENOUS | Status: DC
  Administered 2018-03-10: 510 mg via INTRAVENOUS
  Filled 2018-03-10: qty 510

## 2018-03-10 MED ORDER — DARBEPOETIN ALFA 40 MCG/0.4ML IJ SOSY
PREFILLED_SYRINGE | INTRAMUSCULAR | Status: AC
  Filled 2018-03-10: qty 0.4

## 2018-03-11 DIAGNOSIS — M6281 Muscle weakness (generalized): Secondary | ICD-10-CM | POA: Diagnosis not present

## 2018-03-11 DIAGNOSIS — R293 Abnormal posture: Secondary | ICD-10-CM | POA: Diagnosis not present

## 2018-03-11 DIAGNOSIS — I509 Heart failure, unspecified: Secondary | ICD-10-CM | POA: Diagnosis not present

## 2018-03-11 DIAGNOSIS — J09X2 Influenza due to identified novel influenza A virus with other respiratory manifestations: Secondary | ICD-10-CM | POA: Diagnosis not present

## 2018-03-12 DIAGNOSIS — J09X2 Influenza due to identified novel influenza A virus with other respiratory manifestations: Secondary | ICD-10-CM | POA: Diagnosis not present

## 2018-03-12 DIAGNOSIS — M6281 Muscle weakness (generalized): Secondary | ICD-10-CM | POA: Diagnosis not present

## 2018-03-12 DIAGNOSIS — I509 Heart failure, unspecified: Secondary | ICD-10-CM | POA: Diagnosis not present

## 2018-03-12 DIAGNOSIS — R293 Abnormal posture: Secondary | ICD-10-CM | POA: Diagnosis not present

## 2018-03-15 DIAGNOSIS — M6281 Muscle weakness (generalized): Secondary | ICD-10-CM | POA: Diagnosis not present

## 2018-03-15 DIAGNOSIS — I509 Heart failure, unspecified: Secondary | ICD-10-CM | POA: Diagnosis not present

## 2018-03-15 DIAGNOSIS — R293 Abnormal posture: Secondary | ICD-10-CM | POA: Diagnosis not present

## 2018-03-15 DIAGNOSIS — J09X2 Influenza due to identified novel influenza A virus with other respiratory manifestations: Secondary | ICD-10-CM | POA: Diagnosis not present

## 2018-03-16 DIAGNOSIS — R293 Abnormal posture: Secondary | ICD-10-CM | POA: Diagnosis not present

## 2018-03-16 DIAGNOSIS — M6281 Muscle weakness (generalized): Secondary | ICD-10-CM | POA: Diagnosis not present

## 2018-03-16 DIAGNOSIS — J09X2 Influenza due to identified novel influenza A virus with other respiratory manifestations: Secondary | ICD-10-CM | POA: Diagnosis not present

## 2018-03-16 DIAGNOSIS — I509 Heart failure, unspecified: Secondary | ICD-10-CM | POA: Diagnosis not present

## 2018-03-17 ENCOUNTER — Ambulatory Visit (HOSPITAL_COMMUNITY)
Admission: RE | Admit: 2018-03-17 | Discharge: 2018-03-17 | Disposition: A | Discharge status: Home / Self Care | Payer: Medicare Other | Source: Ambulatory Visit | Attending: Nephrology | Admitting: Nephrology

## 2018-03-17 DIAGNOSIS — D631 Anemia in chronic kidney disease: Secondary | ICD-10-CM | POA: Diagnosis not present

## 2018-03-17 DIAGNOSIS — J09X2 Influenza due to identified novel influenza A virus with other respiratory manifestations: Secondary | ICD-10-CM | POA: Diagnosis not present

## 2018-03-17 DIAGNOSIS — M6281 Muscle weakness (generalized): Secondary | ICD-10-CM | POA: Diagnosis not present

## 2018-03-17 DIAGNOSIS — N189 Chronic kidney disease, unspecified: Secondary | ICD-10-CM | POA: Insufficient documentation

## 2018-03-17 DIAGNOSIS — I509 Heart failure, unspecified: Secondary | ICD-10-CM | POA: Diagnosis not present

## 2018-03-17 DIAGNOSIS — R293 Abnormal posture: Secondary | ICD-10-CM | POA: Diagnosis not present

## 2018-03-17 MED ORDER — SODIUM CHLORIDE 0.9 % IV SOLN
510.0000 mg | INTRAVENOUS | Status: AC
  Administered 2018-03-17: 510 mg via INTRAVENOUS
  Filled 2018-03-17: qty 510

## 2018-03-18 DIAGNOSIS — J09X2 Influenza due to identified novel influenza A virus with other respiratory manifestations: Secondary | ICD-10-CM | POA: Diagnosis not present

## 2018-03-18 DIAGNOSIS — M6281 Muscle weakness (generalized): Secondary | ICD-10-CM | POA: Diagnosis not present

## 2018-03-18 DIAGNOSIS — R293 Abnormal posture: Secondary | ICD-10-CM | POA: Diagnosis not present

## 2018-03-18 DIAGNOSIS — I509 Heart failure, unspecified: Secondary | ICD-10-CM | POA: Diagnosis not present

## 2018-03-19 DIAGNOSIS — R293 Abnormal posture: Secondary | ICD-10-CM | POA: Diagnosis not present

## 2018-03-19 DIAGNOSIS — M6281 Muscle weakness (generalized): Secondary | ICD-10-CM | POA: Diagnosis not present

## 2018-03-19 DIAGNOSIS — I509 Heart failure, unspecified: Secondary | ICD-10-CM | POA: Diagnosis not present

## 2018-03-19 DIAGNOSIS — J09X2 Influenza due to identified novel influenza A virus with other respiratory manifestations: Secondary | ICD-10-CM | POA: Diagnosis not present

## 2018-03-22 DIAGNOSIS — R293 Abnormal posture: Secondary | ICD-10-CM | POA: Diagnosis not present

## 2018-03-22 DIAGNOSIS — M6281 Muscle weakness (generalized): Secondary | ICD-10-CM | POA: Diagnosis not present

## 2018-03-22 DIAGNOSIS — J09X2 Influenza due to identified novel influenza A virus with other respiratory manifestations: Secondary | ICD-10-CM | POA: Diagnosis not present

## 2018-03-22 DIAGNOSIS — I509 Heart failure, unspecified: Secondary | ICD-10-CM | POA: Diagnosis not present

## 2018-03-23 DIAGNOSIS — I509 Heart failure, unspecified: Secondary | ICD-10-CM | POA: Diagnosis not present

## 2018-03-23 DIAGNOSIS — M6281 Muscle weakness (generalized): Secondary | ICD-10-CM | POA: Diagnosis not present

## 2018-03-23 DIAGNOSIS — E088 Diabetes mellitus due to underlying condition with unspecified complications: Secondary | ICD-10-CM | POA: Diagnosis not present

## 2018-03-23 DIAGNOSIS — J09X2 Influenza due to identified novel influenza A virus with other respiratory manifestations: Secondary | ICD-10-CM | POA: Diagnosis not present

## 2018-03-23 DIAGNOSIS — R293 Abnormal posture: Secondary | ICD-10-CM | POA: Diagnosis not present

## 2018-03-23 DIAGNOSIS — J449 Chronic obstructive pulmonary disease, unspecified: Secondary | ICD-10-CM | POA: Diagnosis not present

## 2018-03-23 DIAGNOSIS — D500 Iron deficiency anemia secondary to blood loss (chronic): Secondary | ICD-10-CM | POA: Diagnosis not present

## 2018-03-24 DIAGNOSIS — I509 Heart failure, unspecified: Secondary | ICD-10-CM | POA: Diagnosis not present

## 2018-03-24 DIAGNOSIS — J9620 Acute and chronic respiratory failure, unspecified whether with hypoxia or hypercapnia: Secondary | ICD-10-CM | POA: Diagnosis not present

## 2018-03-24 DIAGNOSIS — J09X2 Influenza due to identified novel influenza A virus with other respiratory manifestations: Secondary | ICD-10-CM | POA: Diagnosis not present

## 2018-03-24 DIAGNOSIS — D649 Anemia, unspecified: Secondary | ICD-10-CM | POA: Diagnosis not present

## 2018-03-24 DIAGNOSIS — M6281 Muscle weakness (generalized): Secondary | ICD-10-CM | POA: Diagnosis not present

## 2018-03-24 DIAGNOSIS — R293 Abnormal posture: Secondary | ICD-10-CM | POA: Diagnosis not present

## 2018-03-25 DIAGNOSIS — J09X2 Influenza due to identified novel influenza A virus with other respiratory manifestations: Secondary | ICD-10-CM | POA: Diagnosis not present

## 2018-03-25 DIAGNOSIS — J449 Chronic obstructive pulmonary disease, unspecified: Secondary | ICD-10-CM | POA: Diagnosis not present

## 2018-03-25 DIAGNOSIS — I509 Heart failure, unspecified: Secondary | ICD-10-CM | POA: Diagnosis not present

## 2018-03-25 DIAGNOSIS — R293 Abnormal posture: Secondary | ICD-10-CM | POA: Diagnosis not present

## 2018-03-25 DIAGNOSIS — M6281 Muscle weakness (generalized): Secondary | ICD-10-CM | POA: Diagnosis not present

## 2018-03-25 DIAGNOSIS — E088 Diabetes mellitus due to underlying condition with unspecified complications: Secondary | ICD-10-CM | POA: Diagnosis not present

## 2018-03-25 DIAGNOSIS — I150 Renovascular hypertension: Secondary | ICD-10-CM | POA: Diagnosis not present

## 2018-03-26 DIAGNOSIS — J09X2 Influenza due to identified novel influenza A virus with other respiratory manifestations: Secondary | ICD-10-CM | POA: Diagnosis not present

## 2018-03-26 DIAGNOSIS — I509 Heart failure, unspecified: Secondary | ICD-10-CM | POA: Diagnosis not present

## 2018-03-26 DIAGNOSIS — M6281 Muscle weakness (generalized): Secondary | ICD-10-CM | POA: Diagnosis not present

## 2018-03-26 DIAGNOSIS — R293 Abnormal posture: Secondary | ICD-10-CM | POA: Diagnosis not present

## 2018-03-27 DIAGNOSIS — J09X2 Influenza due to identified novel influenza A virus with other respiratory manifestations: Secondary | ICD-10-CM | POA: Diagnosis not present

## 2018-03-27 DIAGNOSIS — M6281 Muscle weakness (generalized): Secondary | ICD-10-CM | POA: Diagnosis not present

## 2018-03-27 DIAGNOSIS — I509 Heart failure, unspecified: Secondary | ICD-10-CM | POA: Diagnosis not present

## 2018-03-27 DIAGNOSIS — R293 Abnormal posture: Secondary | ICD-10-CM | POA: Diagnosis not present

## 2018-03-28 ENCOUNTER — Emergency Department (HOSPITAL_COMMUNITY): Payer: Medicare Other

## 2018-03-28 ENCOUNTER — Encounter (HOSPITAL_COMMUNITY): Payer: Self-pay

## 2018-03-28 ENCOUNTER — Other Ambulatory Visit: Payer: Self-pay

## 2018-03-28 ENCOUNTER — Inpatient Hospital Stay (HOSPITAL_COMMUNITY)
Admission: EM | Admit: 2018-03-28 | Discharge: 2018-04-03 | DRG: 375 | Disposition: A | Discharge status: Skilled Nursing Facility | Payer: Medicare Other | Source: Skilled Nursing Facility | Attending: Family Medicine | Admitting: Family Medicine

## 2018-03-28 DIAGNOSIS — N184 Chronic kidney disease, stage 4 (severe): Secondary | ICD-10-CM | POA: Diagnosis present

## 2018-03-28 DIAGNOSIS — I2510 Atherosclerotic heart disease of native coronary artery without angina pectoris: Secondary | ICD-10-CM | POA: Diagnosis present

## 2018-03-28 DIAGNOSIS — R293 Abnormal posture: Secondary | ICD-10-CM | POA: Diagnosis present

## 2018-03-28 DIAGNOSIS — R531 Weakness: Secondary | ICD-10-CM | POA: Diagnosis not present

## 2018-03-28 DIAGNOSIS — K922 Gastrointestinal hemorrhage, unspecified: Secondary | ICD-10-CM | POA: Diagnosis not present

## 2018-03-28 DIAGNOSIS — N189 Chronic kidney disease, unspecified: Secondary | ICD-10-CM

## 2018-03-28 DIAGNOSIS — E1122 Type 2 diabetes mellitus with diabetic chronic kidney disease: Secondary | ICD-10-CM | POA: Diagnosis present

## 2018-03-28 DIAGNOSIS — R1013 Epigastric pain: Secondary | ICD-10-CM | POA: Diagnosis not present

## 2018-03-28 DIAGNOSIS — E782 Mixed hyperlipidemia: Secondary | ICD-10-CM | POA: Diagnosis present

## 2018-03-28 DIAGNOSIS — Z794 Long term (current) use of insulin: Secondary | ICD-10-CM

## 2018-03-28 DIAGNOSIS — Z89612 Acquired absence of left leg above knee: Secondary | ICD-10-CM | POA: Diagnosis not present

## 2018-03-28 DIAGNOSIS — G4700 Insomnia, unspecified: Secondary | ICD-10-CM | POA: Diagnosis present

## 2018-03-28 DIAGNOSIS — I129 Hypertensive chronic kidney disease with stage 1 through stage 4 chronic kidney disease, or unspecified chronic kidney disease: Secondary | ICD-10-CM | POA: Diagnosis not present

## 2018-03-28 DIAGNOSIS — N281 Cyst of kidney, acquired: Secondary | ICD-10-CM | POA: Diagnosis not present

## 2018-03-28 DIAGNOSIS — Z85828 Personal history of other malignant neoplasm of skin: Secondary | ICD-10-CM

## 2018-03-28 DIAGNOSIS — I714 Abdominal aortic aneurysm, without rupture: Secondary | ICD-10-CM | POA: Diagnosis present

## 2018-03-28 DIAGNOSIS — E039 Hypothyroidism, unspecified: Secondary | ICD-10-CM | POA: Diagnosis present

## 2018-03-28 DIAGNOSIS — D62 Acute posthemorrhagic anemia: Secondary | ICD-10-CM | POA: Diagnosis present

## 2018-03-28 DIAGNOSIS — K259 Gastric ulcer, unspecified as acute or chronic, without hemorrhage or perforation: Secondary | ICD-10-CM | POA: Diagnosis not present

## 2018-03-28 DIAGNOSIS — D631 Anemia in chronic kidney disease: Secondary | ICD-10-CM | POA: Diagnosis not present

## 2018-03-28 DIAGNOSIS — K31811 Angiodysplasia of stomach and duodenum with bleeding: Secondary | ICD-10-CM | POA: Diagnosis present

## 2018-03-28 DIAGNOSIS — D002 Carcinoma in situ of stomach: Secondary | ICD-10-CM | POA: Diagnosis present

## 2018-03-28 DIAGNOSIS — Z993 Dependence on wheelchair: Secondary | ICD-10-CM

## 2018-03-28 DIAGNOSIS — I5032 Chronic diastolic (congestive) heart failure: Secondary | ICD-10-CM | POA: Diagnosis present

## 2018-03-28 DIAGNOSIS — R109 Unspecified abdominal pain: Secondary | ICD-10-CM

## 2018-03-28 DIAGNOSIS — Z66 Do not resuscitate: Secondary | ICD-10-CM | POA: Diagnosis not present

## 2018-03-28 DIAGNOSIS — K2971 Gastritis, unspecified, with bleeding: Secondary | ICD-10-CM | POA: Diagnosis not present

## 2018-03-28 DIAGNOSIS — J9620 Acute and chronic respiratory failure, unspecified whether with hypoxia or hypercapnia: Secondary | ICD-10-CM | POA: Diagnosis present

## 2018-03-28 DIAGNOSIS — Z7982 Long term (current) use of aspirin: Secondary | ICD-10-CM

## 2018-03-28 DIAGNOSIS — R1111 Vomiting without nausea: Secondary | ICD-10-CM | POA: Diagnosis not present

## 2018-03-28 DIAGNOSIS — F0150 Vascular dementia without behavioral disturbance: Secondary | ICD-10-CM | POA: Diagnosis present

## 2018-03-28 DIAGNOSIS — D472 Monoclonal gammopathy: Secondary | ICD-10-CM | POA: Diagnosis present

## 2018-03-28 DIAGNOSIS — D509 Iron deficiency anemia, unspecified: Secondary | ICD-10-CM | POA: Diagnosis not present

## 2018-03-28 DIAGNOSIS — Z951 Presence of aortocoronary bypass graft: Secondary | ICD-10-CM

## 2018-03-28 DIAGNOSIS — Z801 Family history of malignant neoplasm of trachea, bronchus and lung: Secondary | ICD-10-CM | POA: Diagnosis not present

## 2018-03-28 DIAGNOSIS — D696 Thrombocytopenia, unspecified: Secondary | ICD-10-CM | POA: Diagnosis present

## 2018-03-28 DIAGNOSIS — Z7401 Bed confinement status: Secondary | ICD-10-CM | POA: Diagnosis not present

## 2018-03-28 DIAGNOSIS — Z87891 Personal history of nicotine dependence: Secondary | ICD-10-CM | POA: Diagnosis not present

## 2018-03-28 DIAGNOSIS — M2550 Pain in unspecified joint: Secondary | ICD-10-CM | POA: Diagnosis not present

## 2018-03-28 DIAGNOSIS — Z515 Encounter for palliative care: Secondary | ICD-10-CM

## 2018-03-28 DIAGNOSIS — M6281 Muscle weakness (generalized): Secondary | ICD-10-CM | POA: Diagnosis present

## 2018-03-28 DIAGNOSIS — K920 Hematemesis: Secondary | ICD-10-CM | POA: Diagnosis present

## 2018-03-28 DIAGNOSIS — R1012 Left upper quadrant pain: Secondary | ICD-10-CM | POA: Diagnosis not present

## 2018-03-28 DIAGNOSIS — K5900 Constipation, unspecified: Secondary | ICD-10-CM | POA: Diagnosis present

## 2018-03-28 DIAGNOSIS — J449 Chronic obstructive pulmonary disease, unspecified: Secondary | ICD-10-CM | POA: Diagnosis present

## 2018-03-28 DIAGNOSIS — J431 Panlobular emphysema: Secondary | ICD-10-CM | POA: Diagnosis present

## 2018-03-28 DIAGNOSIS — D649 Anemia, unspecified: Secondary | ICD-10-CM

## 2018-03-28 DIAGNOSIS — Z86718 Personal history of other venous thrombosis and embolism: Secondary | ICD-10-CM | POA: Diagnosis not present

## 2018-03-28 DIAGNOSIS — C163 Malignant neoplasm of pyloric antrum: Secondary | ICD-10-CM | POA: Diagnosis not present

## 2018-03-28 DIAGNOSIS — R41841 Cognitive communication deficit: Secondary | ICD-10-CM | POA: Diagnosis present

## 2018-03-28 DIAGNOSIS — M109 Gout, unspecified: Secondary | ICD-10-CM | POA: Diagnosis present

## 2018-03-28 DIAGNOSIS — K3189 Other diseases of stomach and duodenum: Secondary | ICD-10-CM | POA: Diagnosis not present

## 2018-03-28 DIAGNOSIS — Z8249 Family history of ischemic heart disease and other diseases of the circulatory system: Secondary | ICD-10-CM

## 2018-03-28 DIAGNOSIS — C164 Malignant neoplasm of pylorus: Secondary | ICD-10-CM | POA: Diagnosis present

## 2018-03-28 DIAGNOSIS — R0602 Shortness of breath: Secondary | ICD-10-CM | POA: Diagnosis not present

## 2018-03-28 DIAGNOSIS — E1151 Type 2 diabetes mellitus with diabetic peripheral angiopathy without gangrene: Secondary | ICD-10-CM | POA: Diagnosis present

## 2018-03-28 DIAGNOSIS — R262 Difficulty in walking, not elsewhere classified: Secondary | ICD-10-CM | POA: Diagnosis present

## 2018-03-28 DIAGNOSIS — E11319 Type 2 diabetes mellitus with unspecified diabetic retinopathy without macular edema: Secondary | ICD-10-CM | POA: Diagnosis present

## 2018-03-28 DIAGNOSIS — I130 Hypertensive heart and chronic kidney disease with heart failure and stage 1 through stage 4 chronic kidney disease, or unspecified chronic kidney disease: Secondary | ICD-10-CM | POA: Diagnosis present

## 2018-03-28 DIAGNOSIS — C801 Malignant (primary) neoplasm, unspecified: Secondary | ICD-10-CM

## 2018-03-28 DIAGNOSIS — R110 Nausea: Secondary | ICD-10-CM | POA: Diagnosis not present

## 2018-03-28 DIAGNOSIS — C169 Malignant neoplasm of stomach, unspecified: Secondary | ICD-10-CM | POA: Diagnosis not present

## 2018-03-28 DIAGNOSIS — I959 Hypotension, unspecified: Secondary | ICD-10-CM | POA: Diagnosis not present

## 2018-03-28 DIAGNOSIS — N179 Acute kidney failure, unspecified: Secondary | ICD-10-CM | POA: Diagnosis present

## 2018-03-28 DIAGNOSIS — D500 Iron deficiency anemia secondary to blood loss (chronic): Secondary | ICD-10-CM | POA: Diagnosis present

## 2018-03-28 DIAGNOSIS — Z79890 Hormone replacement therapy (postmenopausal): Secondary | ICD-10-CM

## 2018-03-28 DIAGNOSIS — I10 Essential (primary) hypertension: Secondary | ICD-10-CM | POA: Diagnosis present

## 2018-03-28 DIAGNOSIS — K219 Gastro-esophageal reflux disease without esophagitis: Secondary | ICD-10-CM | POA: Diagnosis present

## 2018-03-28 DIAGNOSIS — Z7189 Other specified counseling: Secondary | ICD-10-CM

## 2018-03-28 LAB — CBC WITH DIFFERENTIAL/PLATELET
Abs Immature Granulocytes: 0.03 10*3/uL (ref 0.00–0.07)
Basophils Absolute: 0 10*3/uL (ref 0.0–0.1)
Basophils Relative: 0 %
Eosinophils Absolute: 0.2 10*3/uL (ref 0.0–0.5)
Eosinophils Relative: 2 %
HEMATOCRIT: 20 % — AB (ref 39.0–52.0)
Hemoglobin: 5.9 g/dL — CL (ref 13.0–17.0)
Immature Granulocytes: 0 %
LYMPHS ABS: 0.5 10*3/uL — AB (ref 0.7–4.0)
Lymphocytes Relative: 8 %
MCH: 26.8 pg (ref 26.0–34.0)
MCHC: 29.5 g/dL — ABNORMAL LOW (ref 30.0–36.0)
MCV: 90.9 fL (ref 80.0–100.0)
Monocytes Absolute: 0.7 10*3/uL (ref 0.1–1.0)
Monocytes Relative: 10 %
Neutro Abs: 5.3 10*3/uL (ref 1.7–7.7)
Neutrophils Relative %: 80 %
Platelets: 93 10*3/uL — ABNORMAL LOW (ref 150–400)
RBC: 2.2 MIL/uL — ABNORMAL LOW (ref 4.22–5.81)
RDW: 17.1 % — ABNORMAL HIGH (ref 11.5–15.5)
WBC: 6.7 10*3/uL (ref 4.0–10.5)
nRBC: 0 % (ref 0.0–0.2)

## 2018-03-28 LAB — COMPREHENSIVE METABOLIC PANEL
ALT: 11 U/L (ref 0–44)
AST: 11 U/L — AB (ref 15–41)
Albumin: 3 g/dL — ABNORMAL LOW (ref 3.5–5.0)
Alkaline Phosphatase: 72 U/L (ref 38–126)
Anion gap: 8 (ref 5–15)
BUN: 110 mg/dL — ABNORMAL HIGH (ref 8–23)
CO2: 16 mmol/L — ABNORMAL LOW (ref 22–32)
Calcium: 8.3 mg/dL — ABNORMAL LOW (ref 8.9–10.3)
Chloride: 113 mmol/L — ABNORMAL HIGH (ref 98–111)
Creatinine, Ser: 3.63 mg/dL — ABNORMAL HIGH (ref 0.61–1.24)
GFR calc Af Amer: 19 mL/min — ABNORMAL LOW (ref >60)
GFR calc non Af Amer: 16 mL/min — ABNORMAL LOW (ref >60)
Glucose, Bld: 187 mg/dL — ABNORMAL HIGH (ref 70–99)
Potassium: 5.3 mmol/L — ABNORMAL HIGH (ref 3.5–5.1)
Sodium: 137 mmol/L (ref 135–145)
Total Bilirubin: 0.5 mg/dL (ref 0.3–1.2)
Total Protein: 5.6 g/dL — ABNORMAL LOW (ref 6.5–8.1)

## 2018-03-28 LAB — TROPONIN I

## 2018-03-28 LAB — PROTIME-INR
INR: 1.19
Prothrombin Time: 15 seconds (ref 11.4–15.2)

## 2018-03-28 LAB — BRAIN NATRIURETIC PEPTIDE: B Natriuretic Peptide: 171.7 pg/mL — ABNORMAL HIGH (ref 0.0–100.0)

## 2018-03-28 LAB — AMMONIA: Ammonia: 33 umol/L (ref 9–35)

## 2018-03-28 LAB — PREPARE RBC (CROSSMATCH)

## 2018-03-28 MED ORDER — PANTOPRAZOLE SODIUM 40 MG IV SOLR: 40.0000 mg | Freq: Once | INTRAVENOUS | Status: DC

## 2018-03-28 MED ORDER — PANTOPRAZOLE SODIUM 40 MG IV SOLR
40.0000 mg | Freq: Once | INTRAVENOUS | Status: AC
  Administered 2018-03-28: 40 mg via INTRAVENOUS
  Filled 2018-03-28: qty 40

## 2018-03-28 MED ORDER — SODIUM CHLORIDE 0.9 % IV SOLN: 1.0000 g | INTRAVENOUS | Status: DC

## 2018-03-28 MED ORDER — METRONIDAZOLE IN NACL 5-0.79 MG/ML-% IV SOLN
500.0000 mg | Freq: Once | INTRAVENOUS | Status: DC
  Administered 2018-03-29: 500 mg via INTRAVENOUS
  Filled 2018-03-28: qty 100

## 2018-03-28 MED ORDER — SODIUM CHLORIDE 0.9 % IV SOLN
2.0000 g | Freq: Once | INTRAVENOUS | Status: AC
  Administered 2018-03-28: 2 g via INTRAVENOUS
  Filled 2018-03-28: qty 2

## 2018-03-28 MED ORDER — ACETAMINOPHEN 500 MG PO TABS
1000.0000 mg | ORAL_TABLET | Freq: Once | ORAL | Status: AC
  Administered 2018-03-28: 1000 mg via ORAL
  Filled 2018-03-28: qty 2

## 2018-03-28 MED ORDER — SODIUM CHLORIDE 0.9% IV SOLUTION
Freq: Once | INTRAVENOUS | Status: AC
  Administered 2018-03-29: via INTRAVENOUS

## 2018-03-28 MED ORDER — SODIUM CHLORIDE 0.9 % IV SOLN
8.0000 mg/h | INTRAVENOUS | Status: AC
  Administered 2018-03-28 – 2018-03-31 (×3): 8 mg/h via INTRAVENOUS
  Filled 2018-03-28 (×10): qty 80

## 2018-03-28 NOTE — ED Provider Notes (Signed)
[facility] EMERGENCY DEPARTMENT Provider Note   CSN: [id number] Arrival date & time:       History   Chief Complaint Chief Complaint  Patient presents with  . Abnormal Lab    HPI Leonard [name] is a 68 y.o. male.     HPI   68 yo M with h/o CHF, CAD, chronic anemia on IV iron infusions here with weakness. Per report from the facility, pt has had progressively worsening weakness and hemoglobin levels x 1 week. Earlier today, he began to c/o mild epigastric pain and began vomiting. He vomited dark red/black emesis x 3 and was sent here. He denies any pain currently. He states he feels "tired" but denies other complaints. He has had GIB in past per report, but no recent EGD. He is no longer on coumadin per records. He states he feels like he just wants to sleep, no pain. No CP. He has mild lightheadedness when sitting up- is not ambulatory 2/2 AKA. No known fevers that he recalls.  Past Medical History:  Diagnosis Date  . Acute on chronic diastolic heart failure ([place]) 07/04/2014  . Anemia of renal disease   . Atherosclerotic peripheral vascular disease with gangrene ([place]) 02/14/2017  . CAD (coronary artery disease) 04/06/2013  . Cancer ([place])    skin cancer-  . CHF (congestive heart failure) ([place])   . Chronic anemia 02/28/2016  . Chronic deep vein thrombosis (DVT) of femoral vein of left lower extremity ([place]) 09/03/2016  . Chronic respiratory failure with hypoxia ([place]) 08/25/2017  . CKD (chronic kidney disease) stage 4, GFR 15-29 ml/min (HCC) 01/15/2015   Follows with [facility].  Last visits:  07/23/16: No change in meds. Worried that if we increase diuretics could lead to another AKI  . CKD (chronic kidney disease), stage IV ([place])    07/07/2017  . Claudication ([place])   . Collapse of left talus 01/14/2017  . COPD GOLD III  02/16/2014   Quit smoking 2014  - PFT's  04/06/13   FEV1 1.28 (44 % ) ratio 48  p no % improvement from saba p ?  prior to study with DLCO  43/54  % corrects to 97  % for alv volume - 01/31/2016  After extensive coaching HFA effectiveness =    75% from baseline 0 > changed to sprivia respimat   - PFT's  03/17/2016  FEV1 2.25  (81 % ) ratio 88  p 2 % improvement from saba p spiriva respimat x 2  prior to study with  . Coronary artery disease   . Diabetes ([place])   . Diabetic polyneuropathy associated with type 2 diabetes mellitus ([place]) 01/14/2017  . Diabetic retinopathy ([place]) 11/13/2015   Per Dr. Erenest Rasher eye exam in 10/17/15, mild background diabetic retinopathy  . Fatigue associated with anemia 01/21/2016  . Gangrene of toe ([place])    right 5th/notes 01/04/2013   . HCAP (healthcare-associated pneumonia)   . Hematuria 06/27/2015  . High cholesterol   . History of tobacco use 08/23/2013   Smoked pack per day for 46 years Quit smoking - 02/03/2013   . Hypertension   . Hypothyroidism 11/22/2014  . Idiopathic chronic gout of left foot without tophus   . Iron deficiency anemia 08/03/2015  . Left foot pain 10/29/2016  . Long term (current) use of anticoagulants [Z79.01] 09/09/2016  . 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 Mixed hyperlipidemia 07/17/2008  . Morbid obesity due to excess calories ([place]) 03/17/2016  .  PAD (peripheral artery disease) ([place])   . Pleural effusion   . Pneumonitis 07/26/2017  . Pulmonary artery hypertension ([place]) 07/25/2017  . PVD - hx of Rt SFA PTA and s/p Rt 4-5th toe amp Dec 2014 04/05/2013  . 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 Respiratory failure ([place]) 08/13/2017  . Respiratory failure with hypoxia ([place]) 09/23/2017  . 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 Secondary hypertension   . Shortness of breath dyspnea    with exertion  . Skin lesion of face 02/22/2015  . SOB (shortness of breath)   . Type 2 diabetes mellitus with pressure callus ([place]) 12/29/2016  . Type II diabetes mellitus (HCC)    Type II  . Type II diabetes mellitus with complication [facility])     Patient Active Problem List   Diagnosis Date Noted   . Dysphagia 10/01/2017  . Palliative care by specialist   . Severe comorbid illness   . Moderate malnutrition ([place]) 08/27/2017  . Hypoxemia   . Atelectasis   . Chronic respiratory failure with hypoxia ([place]) 08/25/2017  . Pulmonary artery hypertension ([place]) 07/25/2017  . Pressure injury of skin 07/20/2017  . Goals of care, counseling/discussion 02/11/2017  . Diabetic polyneuropathy associated with type 2 diabetes mellitus ([place]) 01/14/2017  . Pleural effusion   . Chronic anemia 02/28/2016  . Diabetic retinopathy ([place]) 11/13/2015  . Iron deficiency anemia 08/03/2015  . Type II diabetes mellitus with complication ([place])   . Chronic kidney disease (CKD), stage III (moderate) (HCC)   . 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 Deficiency anemia 01/15/2015  . CKD (chronic kidney disease) stage 4, GFR 15-29 ml/min (HCC) 01/15/2015  . Hypothyroidism 11/22/2014  . COPD GOLD III  02/16/2014  . Anemia of renal disease   . Chronic diastolic CHF (congestive heart failure) ([place])   . History of tobacco use 08/23/2013  . 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 CAD (coronary artery disease) 04/06/2013  . PVD - hx of Rt SFA PTA and s/p Rt 4-5th toe amp Dec 2014 04/05/2013  . 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 Mixed hyperlipidemia 07/17/2008    Past Surgical History:  Procedure Laterality Date  . ABDOMINAL AORTOGRAM W/LOWER EXTREMITY N/A 03/12/2017   Procedure: ABDOMINAL AORTOGRAM W/LOWER EXTREMITY;  Surgeon: Conrad Eldorado, MD;  Location: [facility];  Service: Cardiovascular;  Laterality: N/A;  . AMPUTATION Right 01/28/2013   Procedure: RAY AMPUTATION RIGHT  4th & 5th TOE;  Surgeon: Rosetta Posner, MD;  Location: [facility];  Service: Vascular;  Laterality: Right;  . AMPUTATION Left 03/15/2017   Procedure: AMPUTATION ABOVE KNEE;  Surgeon: Waynetta Sandy, MD;  Location: [facility];  Service: Vascular;  Laterality: Left;  . ANGIOPLASTY / STENTING FEMORAL Right 01/13/2013    superficial femoral artery x 2 (3 mm x 60 mm, 4 mm x 60 mm)  . AV FISTULA PLACEMENT Left 02/11/2016   Procedure: LEFT UPPER ARM BRACHIAL CEPHALIC ARTERIOVENOUS (AV) FISTULA CREATION;  Surgeon: Conrad Tobaccoville, MD;  Location: [facility];  Service: Vascular;  Laterality: Left;  . COLONOSCOPY W/ POLYPECTOMY    . CORONARY ARTERY BYPASS GRAFT N/A 04/07/2013   Procedure: CORONARY ARTERY BYPASS GRAFTING (CABG);  Surgeon: Ivin Poot, MD;  Location: [facility];  Service: Open Heart Surgery;  Laterality: N/A;  . INTRAOPERATIVE TRANSESOPHAGEAL ECHOCARDIOGRAM N/A 04/07/2013   Procedure: INTRAOPERATIVE TRANSESOPHAGEAL ECHOCARDIOGRAM;  Surgeon: Ivin Poot, MD;  Location: [facility];  Service: Open Heart Surgery;  Laterality: N/A;  . IR  FLUORO GUIDE CV LINE RIGHT  11/04/2017  . IR REMOVAL TUN CV CATH W/O FL  11/27/2017  . IR US GUIDE VASC ACCESS RIGHT  11/04/2017  . LEFT HEART CATHETERIZATION WITH CORONARY ANGIOGRAM N/A 04/05/2013   Procedure: LEFT HEART CATHETERIZATION WITH CORONARY ANGIOGRAM;  Surgeon: Peter M Martinique, MD;  Location: [facility] CATH LAB;  Service: Cardiovascular;  Laterality: N/A;  . LOWER EXTREMITY ANGIOGRAM Bilateral 01/06/2013   Procedure: LOWER EXTREMITY ANGIOGRAM;  Surgeon: Conrad Norcatur, MD;  Location: [facility] CATH LAB;  Service: Cardiovascular;  Laterality: Bilateral;  . LOWER EXTREMITY ANGIOGRAM Right 01/13/2013   Procedure: LOWER EXTREMITY ANGIOGRAM;  Surgeon: Conrad Evergreen, MD;  Location: [facility] CATH LAB;  Service: Cardiovascular;  Laterality: Right;  . SKIN CANCER EXCISION  2017  . THROAT SURGERY  1983   polyps  . TONSILLECTOMY AND ADENOIDECTOMY  ~ 1965        Home Medications    Prior to Admission medications   Medication Sig Start Date End Date Taking? Authorizing Provider  acetaminophen (TYLENOL) 325 MG tablet Take 650 mg by mouth every 8 (eight) hours as needed for mild pain or fever.     [provider]  aspirin EC 81 MG tablet Take 1 tablet (81 mg total) by mouth daily. 08/07/17 08/07/18  Steve Rattler, DO  atorvastatin (LIPITOR) 40 MG tablet TAKE ONE (1) TABLET BY MOUTH EVERY DAY AT 6:00PM Patient taking differently: Take 40 mg by mouth daily at 6 PM.  11/17/16   Larey Dresser, MD  Blood Glucose Monitoring Suppl ([place]) w/Device KIT 1 kit by Does not apply route daily. 10/10/16   McDiarmid, Blane Ohara, MD  carvedilol (COREG) 12.5 MG tablet Take 1 tablet (12.5 mg total) by mouth 2 (two) times daily with a meal. 08/30/17   Meccariello, Bernita Raisin, DO  cloNIDine (CATAPRES - DOSED IN MG/24 HR) 0.2 mg/24hr patch Place 1 patch (0.2 mg total) onto the skin once a week. Patient taking differently: Place 0.2 mg onto the skin every Monday.  08/09/17   Steve Rattler, DO  Darbepoetin Alfa (ARANESP) 40 MCG/0.4ML SOSY injection Inject 0.4 mLs (40 mcg total) into the skin every 30 (thirty) days. 09/28/17   Meccariello, Bernita Raisin, DO  doxazosin (CARDURA) 8 MG tablet Take 1 tablet (8 mg total) by mouth every evening. 12/01/16   Zenia Resides, MD  feeding supplement, ENSURE ENLIVE, (ENSURE ENLIVE) LIQD Take 237 mLs by mouth 3 (three) times daily between meals. 08/07/17   Steve Rattler, DO  ferrous sulfate (FEROSUL) 325 (65 FE) MG tablet Take 1 tablet (325 mg total) by mouth daily at 12 noon. 08/31/17   Meccariello, Bernita Raisin, DO  glucose blood (ONETOUCH VERIO) test strip Use as instructed to test three times daily. ICD-10 code: E11.22 10/15/16   McDiarmid, Blane Ohara, MD  hydrALAZINE (APRESOLINE) 100 MG tablet TAKE ONE TABLET BY MOUTH EVERY EIGHT HOURS Patient taking differently: Take 100 mg by mouth every 8 (eight) hours.  11/14/16   [name], MD  ipratropium-albuterol (DUONEB) 0.5-2.5 (3) MG/3ML SOLN Take 3 mLs by nebulization 4 (four) times daily as needed (shortness of breath and wheezing).     [provider]  isosorbide mononitrate (IMDUR) 120 MG 24 hr tablet Take 1 tablet (120 mg total) by mouth daily. 10/02/17   Anderson, Chelsey L, DO  levothyroxine (SYNTHROID,  LEVOTHROID) 100 MCG tablet Take 100 mcg by mouth daily before breakfast.     [provider]  LONHALA MAGNAIR STARTER KIT 25 MCG/ML SOLN Inhale 1 mL into the lungs 2 (two) times daily. 09/21/17   [provider]  nitroGLYCERIN (NITROSTAT) 0.4 MG SL tablet Place 1 tablet (0.4 mg total) under the tongue every 5 (five) minutes as needed for chest pain. Patient not taking: Reported on 02/16/2018 11/14/16   [name], MD  NOVOLOG 100 UNIT/ML injection  11/13/17   [provider]  omeprazole (PRILOSEC) 20 MG capsule  10/02/17   [provider]  [facility] DELICA LANCETS 37S MISC 1 each by Does not apply route 3 (three) times daily. ICD-10 code: E11.22 10/15/16   McDiarmid, Blane Ohara, MD  polyethylene glycol [facility] Floria Raveling) packet Take 17 g by mouth daily as needed for mild constipation or moderate constipation. Patient taking differently: Take 17 g by mouth every 8 (eight) hours as needed for mild constipation.  08/07/17   Steve Rattler, DO  potassium chloride SA (K-DUR,KLOR-CON) 20 MEQ tablet Take 2 tablets (40 mEq total) by mouth daily. 02/16/18   Larey Dresser, MD  torsemide (DEMADEX) 20 MG tablet Take 2 tablets (40 mg total) by mouth daily. 02/16/18   Larey Dresser, MD    Family History Family History  Problem Relation Age of Onset  . Cancer Mother        lung cancer  . Hypertension Mother   . Cancer Sister        patient thinks it was uterine cancer  . Hypertension Sister   . Heart attack Sister   . Stroke Neg Hx     Social History Social History   Tobacco Use  . Smoking status: Former Smoker    Packs/day: 1.00    Years: 46.00    Pack years: 46.00    Types: Cigarettes    Start date: 02/04/1967    Last attempt to quit: 02/03/2013    Years since quitting: 5.1  . Smokeless tobacco: Never Used  Substance Use Topics  . Alcohol use: No    Alcohol/week: 0.0 standard drinks  . Drug use: No     Allergies   Penicillins   Review of  Systems Review of Systems  Constitutional: Positive for fatigue. Negative for chills and fever.  HENT: Negative for congestion and rhinorrhea.   Eyes: Negative for visual disturbance.  Respiratory: Negative for cough, shortness of breath and wheezing.   Cardiovascular: Negative for chest pain and leg swelling.  Gastrointestinal: Positive for nausea. Negative for abdominal pain, diarrhea and vomiting.  Genitourinary: Negative for dysuria and flank pain.  Musculoskeletal: Negative for neck pain and neck stiffness.  Skin: Negative for rash and wound.  Allergic/Immunologic: Negative for immunocompromised state.  Neurological: Positive for weakness and light-headedness. Negative for syncope and headaches.  All other systems reviewed and are negative.    Physical Exam Updated Vital Signs BP (!) 149/59   Pulse 73   Temp 99.2 F (37.3 C) (Oral)   Resp 19   Ht '5\' 8"'  (1.727 m)   Wt 78 kg   SpO2 100%   BMI 26.15 kg/m   Physical Exam Constitutional:      Appearance: He is ill-appearing.     Comments: Appears older than stated age  HENT:     Head: Normocephalic.     Nose: Nose normal.     Mouth/Throat:     Mouth: Mucous membranes are dry.  Eyes:     Conjunctiva/sclera: Conjunctivae normal.     Pupils: Pupils are equal, round,  and reactive to light.  Neck:     Musculoskeletal: Neck supple.  Cardiovascular:     Rate and Rhythm: Normal rate.     Pulses: Normal pulses.  Pulmonary:     Effort: Pulmonary effort is normal.     Breath sounds: Normal breath sounds. No stridor.  Abdominal:     General: Abdomen is flat. There is no distension.     Tenderness: There is no abdominal tenderness. There is no guarding or rebound.  Musculoskeletal:     Right lower leg: Edema present.     Comments: S/p left AKA  Skin:    General: Skin is warm.     Capillary Refill: Capillary refill takes less than 2 seconds.      ED Treatments / Results  Labs (all labs ordered are listed, but only  abnormal results are displayed) Labs Reviewed  CBC WITH DIFFERENTIAL/PLATELET - Abnormal; Notable for the following components:      Result Value   RBC 2.20 (*)    Hemoglobin 5.9 (*)    HCT 20.0 (*)    MCHC 29.5 (*)    RDW 17.1 (*)    Platelets 93 (*)    Lymphs Abs 0.5 (*)    All other components within normal limits  COMPREHENSIVE METABOLIC PANEL - Abnormal; Notable for the following components:   Potassium 5.3 (*)    Chloride 113 (*)    CO2 16 (*)    Glucose, Bld 187 (*)    BUN 110 (*)    Creatinine, Ser 3.63 (*)    Calcium 8.3 (*)    Total Protein 5.6 (*)    Albumin 3.0 (*)    AST 11 (*)    GFR calc non Af Amer 16 (*)    GFR calc Af Amer 19 (*)    All other components within normal limits  BRAIN NATRIURETIC PEPTIDE - Abnormal; Notable for the following components:   B Natriuretic Peptide 171.7 (*)    All other components within normal limits  CULTURE, BLOOD (ROUTINE X 2)  CULTURE, BLOOD (ROUTINE X 2)  PROTIME-INR  TROPONIN I  AMMONIA  TYPE AND SCREEN  PREPARE RBC (CROSSMATCH)    EKG EKG Interpretation  Date/Time:  Sunday March 28 2018 17:54:03 EST Ventricular Rate:  86 PR Interval:    QRS Duration: 93 QT Interval:  366 QTC Calculation: 438 R Axis:   63 Text Interpretation:  Sinus rhythm Borderline T abnormalities, lateral leads No significant change since last tracing Confirmed by Duffy Bruce [phone]) on 03/28/2018 6:29:52 PM   Radiology Ct Abdomen Pelvis Wo Contrast  Result Date: 03/28/2018 CLINICAL DATA:  Nausea, vomiting EXAM: CT ABDOMEN AND PELVIS WITHOUT CONTRAST TECHNIQUE: Multidetector CT imaging of the abdomen and pelvis was performed following the standard protocol without IV contrast. COMPARISON:  None. FINDINGS: Lower chest: No confluent opacities or effusions. Hepatobiliary: No focal hepatic abnormality. Gallbladder unremarkable. Pancreas: No focal abnormality or ductal dilatation. Spleen: Calcifications in the spleen.  Normal size.  Adrenals/Urinary Tract: Renovascular calcifications. No urinary tract stones or hydronephrosis. Exophytic low-density lesion off the lower pole of the left kidney likely reflects a cyst although this cannot be fully characterized on this unenhanced study. This measures 2 cm. Adrenal glands and urinary bladder unremarkable. Stomach/Bowel: Large stool burden throughout the colon. Normal appendix. Stomach and small bowel grossly unremarkable unenhanced appearance. Vascular/Lymphatic: 3.3 cm infrarenal abdominal aortic aneurysm extending into both common iliac arteries which measure 2.4 cm on the right and 2.2 cm on the  left. No adenopathy. Reproductive: No visible focal abnormality. Other: No free fluid or free air. Musculoskeletal: No acute bony abnormality. IMPRESSION: Infrarenal abdominal aortic aneurysm, 3.3 cm maximally. Recommend followup by ultrasound in 3 years. This recommendation follows ACR consensus guidelines: White Paper of the ACR Incidental Findings Committee II on Vascular Findings. J Am Coll Radiol 2013; 10:789-794. Aortic aneurysm NOS (ICD10-I71.9) Large stool burden throughout the colon. No acute findings in the abdomen or pelvis. Electronically Signed   By: Rolm Baptise M.D.   On: 03/28/2018 21:47   Dg Chest 2 View  Result Date: 03/28/2018 CLINICAL DATA:  Shortness of breath EXAM: CHEST - 2 VIEW COMPARISON:  09/23/2017 FINDINGS: Cardiomegaly. Prior CABG. No confluent airspace opacities, effusions or edema. No acute bony abnormality. IMPRESSION: Cardiomegaly.  No active disease. Electronically Signed   By: Rolm Baptise M.D.   On: 03/28/2018 20:07    Procedures .Critical Care Performed by: Duffy Bruce, MD Authorized by: Duffy Bruce, MD   Critical care provider statement:    Critical care time (minutes):  45   Critical care time was exclusive of:  Separately billable procedures and treating other patients and teaching time   Critical care was necessary to treat or prevent  imminent or life-threatening deterioration of the following conditions:  Cardiac failure, circulatory failure and sepsis   Critical care was time spent personally by me on the following activities:  Development of treatment plan with patient or surrogate, discussions with consultants, evaluation of patient's response to treatment, examination of patient, obtaining history from patient or surrogate, ordering and performing treatments and interventions, ordering and review of laboratory studies, ordering and review of radiographic studies, pulse oximetry, re-evaluation of patient's condition and review of old charts   I assumed direction of critical care for this patient from another provider in my specialty: no     (including critical care time)  Medications Ordered in ED Medications  0.9 %  sodium chloride infusion (Manually program via Guardrails IV Fluids) (has no administration in time range)  pantoprazole (PROTONIX) 80 mg in sodium chloride 0.9 % 250 mL (0.32 mg/mL) infusion (8 mg/hr Intravenous New Bag/Given 03/28/18 2030)  pantoprazole (PROTONIX) injection 40 mg (0 mg Intravenous Hold 03/28/18 1937)  metroNIDAZOLE (FLAGYL) IVPB 500 mg (has no administration in time range)  ceFEPIme (MAXIPIME) 2 g in sodium chloride 0.9 % 100 mL IVPB (has no administration in time range)  ceFEPIme (MAXIPIME) 1 g in sodium chloride 0.9 % 100 mL IVPB (has no administration in time range)  pantoprazole (PROTONIX) injection 40 mg (40 mg Intravenous Given 03/28/18 1846)  acetaminophen (TYLENOL) tablet 1,000 mg (1,000 mg Oral Given 03/28/18 2030)     Initial Impression / Assessment and Plan / ED Course  I have reviewed the triage vital signs and the nursing notes.  Pertinent labs & imaging results that were available during my care of the patient were reviewed by me and considered in my medical decision making (see chart for details).        68 year old male with extensive past medical history as above here  with generalized weakness and drowsiness.  Lab work shows significant acute on chronic anemia, as well as acute on chronic kidney injury with market elevation in BUN.  Bicarb decreased at 16.  Exam, history is concerning for peptic ulcer disease with possible active bleed.  He is otherwise hemodynamically stable however, which is reassuring.  Abdomen is soft without signs of peritonitis.  Given this is a new symptom and onset  for him, CT scan obtained which fortunately shows no evidence of perforation or other complication.  I see no ascites or evidence of cirrhosis.  Do not suspect variceal bleed.  Patient given PPI bolus and drip, and case discussed with Dr. Havery Moros.  Will admit to medicine.  Final Clinical Impressions(s) / ED Diagnoses   Final diagnoses:  Symptomatic anemia  Gastrointestinal hemorrhage, unspecified gastrointestinal hemorrhage type  Acute renal failure superimposed on chronic kidney disease, unspecified CKD stage, unspecified acute renal failure type [facility])    ED Discharge Orders    None       Duffy Bruce, MD 03/28/18 2158

## 2018-03-28 NOTE — ED Triage Notes (Addendum)
Pt BIB [facility] for eval of low Hgb. Pt is coming from [place] where staff reports his hgb has been gradually dropping since Jan. EMS reports today had 1 episode of dark red bloody vomit w/ clots prompting staff to activate EMS. Pt w/ no acute complaints on arrival, EMS reports he has complained to staff about "feeling weak"; pt is supposed start dialysis soon, but has not yet.LUE fistula. NAD on arrival

## 2018-03-28 NOTE — ED Notes (Signed)
In to begin prep for blood transfusion. Pt noted to have oral temp of 100.5. MD Ellender Hose made aware of temp. At this time, will admin tylenol and re-eval temp s/p antipyretic. Hold blood at this time d/t fever per MD Ellender Hose. Pt hemodynamically stable at this time, no change in status. MD Isaacs aware of plan

## 2018-03-28 NOTE — Progress Notes (Addendum)
Pharmacy Antibiotic Note  Leonard [name] is a 68 y.o. male admitted on 03/28/2018 with anemia for blood transfusion, noted to be febrile. Pharmacy has been consulted for cefepime dosing for possible sepsis. Pt has PCN allergy but appears to have tolerated cephalosporins in the past.  Plan: -Cefepime 2g x1 then 1g IV q24h -Metronidazole x1 ordered by EDP -Follow cultures, LOT, renal funx  Height: 5\' 8"  (172.7 cm) Weight: 171 lb 15.3 oz (78 kg) IBW/kg (Calculated) : 68.4  Temp (24hrs), Avg:100 F (37.8 C), Min:99.5 F (37.5 C), Max:100.5 F (38.1 C)  Recent Labs  Lab 03/28/18 1758  WBC 6.7  CREATININE 3.63*    Estimated Creatinine Clearance: 19.4 mL/min (A) (by C-G formula based on SCr of 3.63 mg/dL (H)).    Allergies  Allergen Reactions  . Penicillins Other (See Comments)    Has patient had a PCN reaction causing immediate rash, facial/tongue/throat swelling, SOB or lightheadedness with hypotension: Unknown Has patient had a PCN reaction causing severe rash involving mucus membranes or skin necrosis: Unknown Has patient had a PCN reaction that required hospitalization: Unknown Has patient had a PCN reaction occurring within the last 10 years: Unknown If all of the above answers are "NO", then may proceed with Cephalosporin use.     Antimicrobials this admission: Cefepime 2/23 >>  Metronidazole 2/23 x1  Dose adjustments this admission: none  Microbiology results: sent  Thank you for allowing pharmacy to be a part of this patient's care.  Arrie Senate, PharmD, BCPS Clinical Pharmacist Please check AMION for all [place] [place] numbers 03/28/2018

## 2018-03-28 NOTE — ED Notes (Signed)
Dr Ellender Hose aware of Hgb 5.9

## 2018-03-28 NOTE — ED Notes (Signed)
Pt brief noted to be clean and dry. Pt resting comfortably in be. NARD, resps even and unlabored.

## 2018-03-29 ENCOUNTER — Encounter (HOSPITAL_COMMUNITY): Payer: Self-pay | Admitting: Internal Medicine

## 2018-03-29 ENCOUNTER — Inpatient Hospital Stay (HOSPITAL_COMMUNITY): Payer: Medicare Other

## 2018-03-29 ENCOUNTER — Inpatient Hospital Stay (HOSPITAL_COMMUNITY): Payer: Medicare Other | Admitting: Certified Registered Nurse Anesthetist

## 2018-03-29 ENCOUNTER — Encounter (HOSPITAL_COMMUNITY)
Admission: EM | Disposition: A | Discharge status: Skilled Nursing Facility | Payer: Self-pay | Source: Skilled Nursing Facility | Attending: Family Medicine

## 2018-03-29 DIAGNOSIS — R1012 Left upper quadrant pain: Secondary | ICD-10-CM

## 2018-03-29 DIAGNOSIS — K3189 Other diseases of stomach and duodenum: Secondary | ICD-10-CM

## 2018-03-29 DIAGNOSIS — K259 Gastric ulcer, unspecified as acute or chronic, without hemorrhage or perforation: Secondary | ICD-10-CM

## 2018-03-29 DIAGNOSIS — Z951 Presence of aortocoronary bypass graft: Secondary | ICD-10-CM

## 2018-03-29 DIAGNOSIS — K920 Hematemesis: Secondary | ICD-10-CM

## 2018-03-29 DIAGNOSIS — D649 Anemia, unspecified: Secondary | ICD-10-CM

## 2018-03-29 HISTORY — PX: BIOPSY: SHX5522

## 2018-03-29 HISTORY — PX: ESOPHAGOGASTRODUODENOSCOPY (EGD) WITH PROPOFOL: SHX5813

## 2018-03-29 LAB — BASIC METABOLIC PANEL
Anion gap: 9 (ref 5–15)
BUN: 106 mg/dL — ABNORMAL HIGH (ref 8–23)
CO2: 16 mmol/L — ABNORMAL LOW (ref 22–32)
Calcium: 8.8 mg/dL — ABNORMAL LOW (ref 8.9–10.3)
Chloride: 114 mmol/L — ABNORMAL HIGH (ref 98–111)
Creatinine, Ser: 3.57 mg/dL — ABNORMAL HIGH (ref 0.61–1.24)
GFR calc Af Amer: 19 mL/min — ABNORMAL LOW (ref >60)
GFR, EST NON AFRICAN AMERICAN: 17 mL/min — AB (ref >60)
Glucose, Bld: 166 mg/dL — ABNORMAL HIGH (ref 70–99)
Potassium: 5.2 mmol/L — ABNORMAL HIGH (ref 3.5–5.1)
Sodium: 139 mmol/L (ref 135–145)

## 2018-03-29 LAB — HEPATIC FUNCTION PANEL
ALK PHOS: 74 U/L (ref 38–126)
ALT: 12 U/L (ref 0–44)
AST: 12 U/L — ABNORMAL LOW (ref 15–41)
Albumin: 3.1 g/dL — ABNORMAL LOW (ref 3.5–5.0)
BILIRUBIN TOTAL: 0.8 mg/dL (ref 0.3–1.2)
Bilirubin, Direct: 0.1 mg/dL (ref 0.0–0.2)
Indirect Bilirubin: 0.7 mg/dL (ref 0.3–0.9)
Total Protein: 5.9 g/dL — ABNORMAL LOW (ref 6.5–8.1)

## 2018-03-29 LAB — GLUCOSE, CAPILLARY
GLUCOSE-CAPILLARY: 190 mg/dL — AB (ref 70–99)
Glucose-Capillary: 172 mg/dL — ABNORMAL HIGH (ref 70–99)
Glucose-Capillary: 158 mg/dL — ABNORMAL HIGH (ref 70–99)
Glucose-Capillary: 127 mg/dL — ABNORMAL HIGH (ref 70–99)
Glucose-Capillary: 129 mg/dL — ABNORMAL HIGH (ref 70–99)
Glucose-Capillary: 156 mg/dL — ABNORMAL HIGH (ref 70–99)

## 2018-03-29 LAB — CBC
HCT: 25.9 % — ABNORMAL LOW (ref 39.0–52.0)
HEMOGLOBIN: 8.2 g/dL — AB (ref 13.0–17.0)
MCH: 27.6 pg (ref 26.0–34.0)
MCHC: 31.7 g/dL (ref 30.0–36.0)
MCV: 87.2 fL (ref 80.0–100.0)
Platelets: 88 10*3/uL — ABNORMAL LOW (ref 150–400)
RBC: 2.97 MIL/uL — ABNORMAL LOW (ref 4.22–5.81)
RDW: 17 % — ABNORMAL HIGH (ref 11.5–15.5)
WBC: 5.9 10*3/uL (ref 4.0–10.5)
nRBC: 0 % (ref 0.0–0.2)

## 2018-03-29 LAB — MRSA PCR SCREENING: MRSA by PCR: NEGATIVE

## 2018-03-29 SURGERY — ESOPHAGOGASTRODUODENOSCOPY (EGD) WITH PROPOFOL
Anesthesia: Monitor Anesthesia Care

## 2018-03-29 MED ORDER — INSULIN ASPART 100 UNIT/ML ~~LOC~~ SOLN
0.0000 [IU] | SUBCUTANEOUS | Status: DC
  Administered 2018-03-29 (×3): 2 [IU] via SUBCUTANEOUS
  Administered 2018-03-29: 1 [IU] via SUBCUTANEOUS
  Administered 2018-03-29 – 2018-03-30 (×2): 2 [IU] via SUBCUTANEOUS
  Administered 2018-03-30 (×2): 1 [IU] via SUBCUTANEOUS
  Administered 2018-03-30 – 2018-03-31 (×4): 2 [IU] via SUBCUTANEOUS
  Administered 2018-03-31: 1 [IU] via SUBCUTANEOUS
  Administered 2018-03-31: 3 [IU] via SUBCUTANEOUS
  Administered 2018-03-31 (×2): 2 [IU] via SUBCUTANEOUS
  Administered 2018-04-01 (×3): 1 [IU] via SUBCUTANEOUS
  Administered 2018-04-01: 2 [IU] via SUBCUTANEOUS
  Administered 2018-04-01: 3 [IU] via SUBCUTANEOUS
  Administered 2018-04-01 – 2018-04-02 (×4): 2 [IU] via SUBCUTANEOUS
  Administered 2018-04-02 (×2): 1 [IU] via SUBCUTANEOUS
  Administered 2018-04-02: 2 [IU] via SUBCUTANEOUS
  Administered 2018-04-03 (×2): 1 [IU] via SUBCUTANEOUS

## 2018-03-29 MED ORDER — IPRATROPIUM-ALBUTEROL 0.5-2.5 (3) MG/3ML IN SOLN: 3.0000 mL | Freq: Four times a day (QID) | RESPIRATORY_TRACT | Status: DC | PRN

## 2018-03-29 MED ORDER — PROPOFOL 10 MG/ML IV BOLUS
INTRAVENOUS | Status: DC | PRN
  Administered 2018-03-29: 40 mg via INTRAVENOUS

## 2018-03-29 MED ORDER — LEVOTHYROXINE SODIUM 100 MCG PO TABS
100.0000 ug | ORAL_TABLET | Freq: Every day | ORAL | Status: DC
  Administered 2018-03-29 – 2018-04-03 (×6): 100 ug via ORAL
  Filled 2018-03-29 (×6): qty 1

## 2018-03-29 MED ORDER — PROPOFOL 500 MG/50ML IV EMUL
INTRAVENOUS | Status: DC | PRN
  Administered 2018-03-29: 125 ug/kg/min via INTRAVENOUS

## 2018-03-29 MED ORDER — CARVEDILOL 12.5 MG PO TABS
12.5000 mg | ORAL_TABLET | Freq: Two times a day (BID) | ORAL | Status: DC
  Administered 2018-03-29 – 2018-04-03 (×10): 12.5 mg via ORAL
  Filled 2018-03-29 (×10): qty 1

## 2018-03-29 MED ORDER — ONDANSETRON HCL 4 MG PO TABS: 4.0000 mg | ORAL_TABLET | Freq: Four times a day (QID) | ORAL | Status: DC | PRN

## 2018-03-29 MED ORDER — CLONIDINE HCL 0.2 MG/24HR TD PTWK
0.2000 mg | MEDICATED_PATCH | TRANSDERMAL | Status: DC
  Administered 2018-03-29: 0.2 mg via TRANSDERMAL
  Filled 2018-03-29: qty 1

## 2018-03-29 MED ORDER — ONDANSETRON HCL 4 MG/2ML IJ SOLN: 4.0000 mg | Freq: Four times a day (QID) | INTRAMUSCULAR | Status: DC | PRN

## 2018-03-29 MED ORDER — ACETAMINOPHEN 650 MG RE SUPP: 650.0000 mg | Freq: Four times a day (QID) | RECTAL | Status: DC | PRN

## 2018-03-29 MED ORDER — ACETAMINOPHEN 325 MG PO TABS
650.0000 mg | ORAL_TABLET | Freq: Four times a day (QID) | ORAL | Status: DC | PRN
  Administered 2018-03-29 – 2018-04-01 (×4): 650 mg via ORAL
  Filled 2018-03-29 (×5): qty 2

## 2018-03-29 MED ORDER — SODIUM CHLORIDE 0.9 % IV SOLN
INTRAVENOUS | Status: DC
  Administered 2018-03-29: 12:00:00 via INTRAVENOUS

## 2018-03-29 MED ORDER — HYDRALAZINE HCL 20 MG/ML IJ SOLN
10.0000 mg | INTRAMUSCULAR | Status: DC | PRN
  Administered 2018-03-29 – 2018-03-31 (×3): 10 mg via INTRAVENOUS
  Filled 2018-03-29 (×3): qty 1

## 2018-03-29 MED ORDER — PHENYLEPHRINE HCL 10 MG/ML IJ SOLN
INTRAMUSCULAR | Status: DC | PRN
  Administered 2018-03-29: 80 ug via INTRAVENOUS

## 2018-03-29 SURGICAL SUPPLY — 15 items

## 2018-03-29 NOTE — Clinical Social Work Note (Signed)
Clinical Social Work Assessment  Patient Details  Name: Leonard [name] MRN: [id number] Date of Birth: 19-Feb-1950  Date of referral:  03/29/18               Reason for consult:  Facility Placement, Discharge Planning                Permission sought to share information with:  Facility Sport and exercise psychologist, Family Supports Permission granted to share information::  Yes, Verbal Permission Granted  Name::     Gillis Santa  Agency::  [facility]  Relationship::  sister  Contact Information:  [phone]  Housing/Transportation Living arrangements for the past 2 months:  [place] of Information:  Patient Patient Interpreter Needed:  None Criminal Activity/Legal Involvement Pertinent to Current Situation/Hospitalization:  No - Comment as needed Significant Relationships:  Warehouse manager, Siblings, Industrial/product designer Lives with:  Facility Resident Do you feel safe going back to the place where you live?  Yes Need for family participation in patient care:  Yes (Comment)  Care giving concerns:  Pt LTC resident at [facility], he states he has no concerns about returning.    Social Worker assessment / plan:  CSW spoke with pt at bedside. Pt from [facility] where he has been residing for more than 8 months. Pt states he has family in [place] that are active in his life. Pt expresses no concerns with facility and states he plans on returning when discharged from hospital.   CSW will continue to follow.   Employment status:  Disabled (Comment on whether or not currently receiving Disability) Insurance information:  Medicare, Medicaid In [place] PT Recommendations:  Not assessed at this time Information / Referral to community resources:  [place]  Patient/Family's Response to care:  Pt amenable to speaking with CSW. His disposition is to return to SNF at [facility].  Patient/Family's Understanding of and Emotional Response to  Diagnosis, Current Treatment, and Prognosis: CSW spoke with pt at bedside. He appears to understand his diagnosis, current treatment and prognosis; although per RN report pt denies some of the symptoms reported by SNF. Pt friendly and emotionally appropriate during assessment, chatted with this Probation officer about [employer]. He appears happy with care here at hospital and at [facility].   Emotional Assessment Appearance:  Appears stated age Attitude/Demeanor/Rapport:  Engaged, Gracious, Charismatic Affect (typically observed):  Accepting, Adaptable, Appropriate, Pleasant Orientation:  Oriented to Place, Oriented to  Time, Oriented to Situation, Oriented to Self Alcohol / Substance use:  Not Applicable Psych involvement (Current and /or in the community):  No (Comment)  Discharge Needs  Concerns to be addressed:  Care Coordination Readmission within the last 30 days:  No Current discharge risk:  Dependent with Mobility, Physical Impairment Barriers to Discharge:  Continued Medical Work up   [employer], [place] 03/29/2018, 11:02 AM

## 2018-03-29 NOTE — Transfer of Care (Signed)
Immediate Anesthesia Transfer of Care Note  Patient: Leonard [name]  Procedure(s) Performed: ESOPHAGOGASTRODUODENOSCOPY (EGD) WITH PROPOFOL (N/A ) BIOPSY  Patient Location: Endoscopy Unit  Anesthesia Type:MAC  Level of Consciousness: awake, alert , oriented and patient cooperative  Airway & Oxygen Therapy: Patient Spontanous Breathing and Patient connected to nasal cannula oxygen  Post-op Assessment: Report given to RN and Post -op Vital signs reviewed and stable  Post vital signs: Reviewed and stable  Last Vitals:  Vitals Value Taken Time  BP    Temp    Pulse    Resp    SpO2      Last Pain:  Vitals:   03/29/18 1157  TempSrc: Oral  PainSc: 0-No pain         Complications: No apparent anesthesia complications

## 2018-03-29 NOTE — Op Note (Signed)
[facility] [name] Procedure Date : 03/29/2018 MRN: [id number] Attending MD: Justice Britain , MD Date of Birth: 12/24/1950 CSN: [id number] Age: 68 Admit Type: Inpatient Procedure:                Upper GI endoscopy Indications:              Epigastric abdominal pain, Unexplained iron                            deficiency anemia, Hematemesis Providers:                Justice Britain, MD, Elna Breslow, RN, Elspeth Cho Tech., Technician, Tawni Carnes, CRNA Referring MD:             Mariel Aloe Medicines:                Monitored Anesthesia Care Complications:            No immediate complications. Estimated Blood Loss:     Estimated blood loss was minimal. Procedure:                Pre-Anesthesia Assessment:                           - Prior to the procedure, a History and Physical                            was performed, and patient medications and                            allergies were reviewed. The patient's tolerance of                            previous anesthesia was also reviewed. The risks                            and benefits of the procedure and the sedation                            options and risks were discussed with the patient.                            All questions were answered, and informed consent                            was obtained. Prior Anticoagulants: The patient has                            taken aspirin. ASA Grade Assessment: III - A                            patient with severe systemic disease. After  reviewing the risks and benefits, the patient was                            deemed in satisfactory condition to undergo the                            procedure.                           After obtaining informed consent, the endoscope was                            passed under direct vision. Throughout the                             procedure, the patient's blood pressure, pulse, and                            oxygen saturations were monitored continuously. The                            GIF-H190 ([id number] Olympus gastroscope was                            introduced through the mouth, and advanced to the                            second part of duodenum. The upper GI endoscopy was                            accomplished without difficulty. The patient                            tolerated the procedure. Scope In: Scope Out: Findings:      No gross lesions were noted in the proximal esophagus, in the mid       esophagus and in the distal esophagus.      Multiple dispersed, small non-bleeding erosions were found in the       gastric body. There were no stigmata of recent bleeding.      Diffuse severe mucosal changes characterized by congestion, erythema,       friability (with contact bleeding), granularity, inflammation and       altered texture were found in the prepyloric region of the stomach and       at the pylorus.      One partially obstructing (about 80% obstructed) non-bleeding cratered       gastric ulcer of significant severity with a flat pigmented spot       (Forrest Class IIc) was found at the pylorus. The lesion was at least 25       mm in largest dimension and encircled the pylorsu. Perforation of the       bowel wall cannot be ruled out but he was maintaining good air during       endoscopy and had a soft abdomen at completion of procedure. Biopsies       were taken with a cold forceps for  histology to rule out malignancy.      No other gross lesions were noted in the entire examined stomach.       Biopsies were taken with a cold forceps for histology and Helicobacter       pylori testing.      After slow manipulation, the pylorus was traversed into the duodenum.       Localized mildly erythematous mucosa and melanosis without active       bleeding and with no stigmata of bleeding was found in the  duodenal bulb.      Normal mucosa was found in the second portion of the duodenum. Impression:               - No gross lesions in esophagus.                           - Non-bleeding erosive gastropathy.                           - Congested, erythematous, friable (with contact                            bleeding), granular, inflamed and texture changed                            mucosa in the prepyloric region of the stomach and                            pylorus.                           - Currently non-bleeding gastric ulcer with a                            possible flat pigmented spot (Forrest Class IIc)                            that encircled the entire pylorus. Biopsied.                           - No other gross lesions in the stomach. Biopsied                            for HP.                           - Erythematous duodenopathy and melanosis.                           - Normal mucosa was found in the second portion of                            the duodenum. Recommendation:           - The patient will be observed post-procedure,                            until all discharge criteria  are met.                           - Return patient to hospital ward for ongoing care.                           - Continue IV PPI BID for 72-hours.                           - Await pathology results. If they return negative                            for malignancy then I would plan to repeat EGD for                            repeat biopsies to be obtained.                           - Patient is going to be high risk of stricturing                            disease if this ulcer heals and turns out to be                            non-malignant.                           - KUB 2-view to be performed later today (order                            placed).                           - Ideally would get a contrasted CT-Abdomen                            however, unable to do so because of his  CRI.                           - Full liquid diet for next 48-hours and dependent                            on how he does may consider a soft, low-residue,                            low-fiber pureed diet to monitor closely.                           - If patient manifests transfusion depedent anemia                            with overt melena, then repeat EGD is reasonable  but there is a likelihood that he may require an IR                            intervention for possible GDA embolization or                            selective embolization.                           - The findings and recommendations were discussed                            with the patient.                           - The findings and recommendations were discussed                            with the referring physician. Procedure Code(s):        --- Professional ---                           [id number], Esophagogastroduodenoscopy, flexible,                            transoral; with biopsy, single or multiple Diagnosis Code(s):        --- Professional ---                           K31.89, Other diseases of stomach and duodenum                           K92.2, Gastrointestinal hemorrhage, unspecified                           K29.70, Gastritis, unspecified, without bleeding                           K25.9, Gastric ulcer, unspecified as acute or                            chronic, without hemorrhage or perforation                           R10.13, Epigastric pain                           D50.9, Iron deficiency anemia, unspecified                           K92.0, Hematemesis CPT copyright 2018 [employer]. All rights reserved. The codes documented in this report are preliminary and upon coder review may  be revised to meet current compliance requirements. Justice Britain, MD 03/29/2018 3:17:26 PM Number of Addenda: 0

## 2018-03-29 NOTE — Consult Note (Signed)
Consultation  Referring Provider: Dr. Lonny Prude      Primary Care Physician:  Kathrene Alu, MD Primary Gastroenterologist: Althia Forts        Reason for Consultation: Anemia, hematemesis             HPI:   Leonard [name] is a 68 y.o. male with a past medical history of CAD status post CABG, peripheral vascular disease status post left AKA, diabetes, diastolic CHF, hypertension, and others listed below who was brought to the ER on 03/28/2018 because he was feeling weak and labs revealed decreasing hemoglobin.    Today, patient is a very poor historian, describes that on 03/27/2018 he started with some epigastric discomfort rated as a 5-6/10 and had 3 episodes of hematemesis, per the people around him, though he does not remember.  Tells me that now his abdominal pain is better and he has had no further vomiting.  He thinks back and tells me that he vaguely remembers nursing staff telling him that he had darker than normal stools starting about a week ago, but cannot tell me the last time he had a bowel movement here or what color it was or what consistency.  He is aware of plans for likely EGD today and denies previous GI history.    Denies fever, chills, weight loss, anorexia, continued nausea, continued abdominal pain or symptoms that awaken him at night.  ER course: In the ER hemoglobin was 5.9 (drop of 2 g from October 2019), CT abdomen pelvis unremarkable, mildly febrile, started on Protonix infusion  Past Medical History:  Diagnosis Date  . Acute on chronic diastolic heart failure ([place]) 07/04/2014  . Anemia of renal disease   . Atherosclerotic peripheral vascular disease with gangrene ([place]) 02/14/2017  . CAD (coronary artery disease) 04/06/2013  . Cancer ([place])    skin cancer-  . CHF (congestive heart failure) ([place])   . Chronic anemia 02/28/2016  . Chronic deep vein thrombosis (DVT) of femoral vein of left lower extremity ([place]) 09/03/2016  . Chronic respiratory failure with hypoxia  ([place]) 08/25/2017  . CKD (chronic kidney disease) stage 4, GFR 15-29 ml/min (HCC) 01/15/2015   Follows with [facility].  Last visits:  07/23/16: No change in meds. Worried that if we increase diuretics could lead to another AKI  . CKD (chronic kidney disease), stage IV ([place])    07/07/2017  . Claudication ([place])   . Collapse of left talus 01/14/2017  . COPD GOLD III  02/16/2014   Quit smoking 2014  - PFT's  04/06/13   FEV1 1.28 (44 % ) ratio 48  p no % improvement from saba p ?  prior to study with DLCO  43/54 % corrects to 97  % for alv volume - 01/31/2016  After extensive coaching HFA effectiveness =    75% from baseline 0 > changed to sprivia respimat   - PFT's  03/17/2016  FEV1 2.25  (81 % ) ratio 88  p 2 % improvement from saba p spiriva respimat x 2  prior to study with  . Coronary artery disease   . Diabetes ([place])   . Diabetic polyneuropathy associated with type 2 diabetes mellitus ([place]) 01/14/2017  . Diabetic retinopathy ([place]) 11/13/2015   Per Dr. Erenest Rasher eye exam in 10/17/15, mild background diabetic retinopathy  . Fatigue associated with anemia 01/21/2016  . Gangrene of toe ([place])    right 5th/notes 01/04/2013   . HCAP (healthcare-associated pneumonia)   . Hematuria 06/27/2015  .  High cholesterol   . History of tobacco use 08/23/2013   Smoked pack per day for 46 years Quit smoking - 02/03/2013   . Hypertension   . Hypothyroidism 11/22/2014  . Idiopathic chronic gout of left foot without tophus   . Iron deficiency anemia 08/03/2015  . Left foot pain 10/29/2016  . Long term (current) use of anticoagulants [Z79.01] 09/09/2016  . 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 Mixed hyperlipidemia 07/17/2008  . Morbid obesity due to excess calories ([place]) 03/17/2016  . PAD (peripheral artery disease) ([place])   . Pleural effusion   . Pneumonitis 07/26/2017  . Pulmonary artery hypertension ([place]) 07/25/2017  . PVD - hx of Rt SFA PTA and s/p Rt 4-5th toe amp Dec 2014 04/05/2013  .  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 Respiratory failure ([place]) 08/13/2017  . Respiratory failure with hypoxia ([place]) 09/23/2017  . 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 Secondary hypertension   . Shortness of breath dyspnea    with exertion  . Skin lesion of face 02/22/2015  . SOB (shortness of breath)   . Type 2 diabetes mellitus with pressure callus ([place]) 12/29/2016  . Type II diabetes mellitus (HCC)    Type II  . Type II diabetes mellitus with complication [name])     Past Surgical History:  Procedure Laterality Date  . ABDOMINAL AORTOGRAM W/LOWER EXTREMITY N/A 03/12/2017   Procedure: ABDOMINAL AORTOGRAM W/LOWER EXTREMITY;  Surgeon: Conrad Boyce, MD;  Location: [place] CV LAB;  Service: Cardiovascular;  Laterality: N/A;  . AMPUTATION Right 01/28/2013   Procedure: RAY AMPUTATION RIGHT  4th & 5th TOE;  Surgeon: Rosetta Posner, MD;  Location: [place];  Service: Vascular;  Laterality: Right;  . AMPUTATION Left 03/15/2017   Procedure: AMPUTATION ABOVE KNEE;  Surgeon: Waynetta Sandy, MD;  Location: [place];  Service: Vascular;  Laterality: Left;  . ANGIOPLASTY / STENTING FEMORAL Right 01/13/2013   superficial femoral artery x 2 (3 mm x 60 mm, 4 mm x 60 mm)  . AV FISTULA PLACEMENT Left 02/11/2016   Procedure: LEFT UPPER ARM BRACHIAL CEPHALIC ARTERIOVENOUS (AV) FISTULA CREATION;  Surgeon: Conrad Molino, MD;  Location: [place];  Service: Vascular;  Laterality: Left;  . COLONOSCOPY W/ POLYPECTOMY    . CORONARY ARTERY BYPASS GRAFT N/A 04/07/2013   Procedure: CORONARY ARTERY BYPASS GRAFTING (CABG);  Surgeon: Ivin Poot, MD;  Location: [place];  Service: Open Heart Surgery;  Laterality: N/A;  . INTRAOPERATIVE TRANSESOPHAGEAL ECHOCARDIOGRAM N/A 04/07/2013   Procedure: INTRAOPERATIVE TRANSESOPHAGEAL ECHOCARDIOGRAM;  Surgeon: Ivin Poot, MD;  Location: [place];  Service: Open Heart Surgery;  Laterality: N/A;  . IR FLUORO GUIDE CV LINE RIGHT  11/04/2017  . IR REMOVAL TUN CV CATH W/O FL  11/27/2017  . IR US GUIDE  VASC ACCESS RIGHT  11/04/2017  . LEFT HEART CATHETERIZATION WITH CORONARY ANGIOGRAM N/A 04/05/2013   Procedure: LEFT HEART CATHETERIZATION WITH CORONARY ANGIOGRAM;  Surgeon: Peter M Martinique, MD;  Location: [place] CATH LAB;  Service: Cardiovascular;  Laterality: N/A;  . LOWER EXTREMITY ANGIOGRAM Bilateral 01/06/2013   Procedure: LOWER EXTREMITY ANGIOGRAM;  Surgeon: Conrad Ayr, MD;  Location: [facility] CATH LAB;  Service: Cardiovascular;  Laterality: Bilateral;  . LOWER EXTREMITY ANGIOGRAM Right 01/13/2013   Procedure: LOWER EXTREMITY ANGIOGRAM;  Surgeon: Conrad Green Lake, MD;  Location: [facility] CATH LAB;  Service: Cardiovascular;  Laterality: Right;  . SKIN CANCER EXCISION  2017  . THROAT SURGERY  1983   polyps  .  TONSILLECTOMY AND ADENOIDECTOMY  ~ 1965    Family History  Problem Relation Age of Onset  . Cancer Mother        lung cancer  . Hypertension Mother   . Cancer Sister        patient thinks it was uterine cancer  . Hypertension Sister   . Heart attack Sister   . Stroke Neg Hx     Social History   Tobacco Use  . Smoking status: Former Smoker    Packs/day: 1.00    Years: 46.00    Pack years: 46.00    Types: Cigarettes    Start date: 02/04/1967    Last attempt to quit: 02/03/2013    Years since quitting: 5.1  . Smokeless tobacco: Never Used  Substance Use Topics  . Alcohol use: No    Alcohol/week: 0.0 standard drinks  . Drug use: No    Prior to Admission medications   Medication Sig Start Date End Date Taking? Authorizing Provider  acetaminophen (TYLENOL) 325 MG tablet Take 650 mg by mouth every 8 (eight) hours as needed for mild pain or fever.     [provider]  aspirin EC 81 MG tablet Take 1 tablet (81 mg total) by mouth daily. 08/07/17 08/07/18  Steve Rattler, DO  atorvastatin (LIPITOR) 40 MG tablet TAKE ONE (1) TABLET BY MOUTH EVERY DAY AT 6:00PM Patient taking differently: Take 40 mg by mouth daily at 6 PM.  11/17/16   Larey Dresser, MD  Blood Glucose Monitoring Suppl  ([place]) w/Device KIT 1 kit by Does not apply route daily. 10/10/16   McDiarmid, Blane Ohara, MD  carvedilol (COREG) 12.5 MG tablet Take 1 tablet (12.5 mg total) by mouth 2 (two) times daily with a meal. 08/30/17   Meccariello, Bernita Raisin, DO  cloNIDine (CATAPRES - DOSED IN MG/24 HR) 0.2 mg/24hr patch Place 1 patch (0.2 mg total) onto the skin once a week. Patient taking differently: Place 0.2 mg onto the skin every Monday.  08/09/17   Steve Rattler, DO  Darbepoetin Alfa (ARANESP) 40 MCG/0.4ML SOSY injection Inject 0.4 mLs (40 mcg total) into the skin every 30 (thirty) days. 09/28/17   Meccariello, Bernita Raisin, DO  doxazosin (CARDURA) 8 MG tablet Take 1 tablet (8 mg total) by mouth every evening. 12/01/16   Zenia Resides, MD  feeding supplement, ENSURE ENLIVE, (ENSURE ENLIVE) LIQD Take 237 mLs by mouth 3 (three) times daily between meals. 08/07/17   Steve Rattler, DO  ferrous sulfate (FEROSUL) 325 (65 FE) MG tablet Take 1 tablet (325 mg total) by mouth daily at 12 noon. 08/31/17   Meccariello, Bernita Raisin, DO  glucose blood (ONETOUCH VERIO) test strip Use as instructed to test three times daily. ICD-10 code: E11.22 10/15/16   McDiarmid, Blane Ohara, MD  hydrALAZINE (APRESOLINE) 100 MG tablet TAKE ONE TABLET BY MOUTH EVERY EIGHT HOURS Patient taking differently: Take 100 mg by mouth every 8 (eight) hours.  11/14/16   [name], MD  ipratropium-albuterol (DUONEB) 0.5-2.5 (3) MG/3ML SOLN Take 3 mLs by nebulization 4 (four) times daily as needed (shortness of breath and wheezing).     [provider]  isosorbide mononitrate (IMDUR) 120 MG 24 hr tablet Take 1 tablet (120 mg total) by mouth daily. 10/02/17   Anderson, Chelsey L, DO  levothyroxine (SYNTHROID, LEVOTHROID) 100 MCG tablet Take 100 mcg by mouth daily before breakfast.     [provider]  [place]  KIT 25 MCG/ML SOLN Inhale 1 mL into the lungs 2 (two) times daily. 09/21/17   [provider]    nitroGLYCERIN (NITROSTAT) 0.4 MG SL tablet Place 1 tablet (0.4 mg total) under the tongue every 5 (five) minutes as needed for chest pain. Patient not taking: Reported on 02/16/2018 11/14/16   [name], MD  NOVOLOG 100 UNIT/ML injection  11/13/17   [provider]  omeprazole (PRILOSEC) 20 MG capsule  10/02/17   [provider]  [facility] DELICA LANCETS 42P MISC 1 each by Does not apply route 3 (three) times daily. ICD-10 code: E11.22 10/15/16   McDiarmid, Blane Ohara, MD  polyethylene glycol [facility] Floria Raveling) packet Take 17 g by mouth daily as needed for mild constipation or moderate constipation. Patient taking differently: Take 17 g by mouth every 8 (eight) hours as needed for mild constipation.  08/07/17   Steve Rattler, DO  potassium chloride SA (K-DUR,KLOR-CON) 20 MEQ tablet Take 2 tablets (40 mEq total) by mouth daily. 02/16/18   Larey Dresser, MD  torsemide (DEMADEX) 20 MG tablet Take 2 tablets (40 mg total) by mouth daily. 02/16/18   Larey Dresser, MD    Current Facility-Administered Medications  Medication Dose Route Frequency Provider Last Rate Last Dose  . acetaminophen (TYLENOL) tablet 650 mg  650 mg Oral Q6H PRN Rise Patience, MD       Or  . acetaminophen (TYLENOL) suppository 650 mg  650 mg Rectal Q6H PRN Rise Patience, MD      . carvedilol (COREG) tablet 12.5 mg  12.5 mg Oral BID WC Rise Patience, MD      . cloNIDine (CATAPRES - Dosed in mg/24 hr) patch 0.2 mg  0.2 mg Transdermal Q Mon Kakrakandy, [place] N, MD      . hydrALAZINE (APRESOLINE) injection 10 mg  10 mg Intravenous Q4H PRN Rise Patience, MD      . insulin aspart (novoLOG) injection 0-9 Units  0-9 Units Subcutaneous Q4H Rise Patience, MD   2 Units at 03/29/18 [phone]  . ipratropium-albuterol (DUONEB) 0.5-2.5 (3) MG/3ML nebulizer solution 3 mL  3 mL Nebulization QID PRN Rise Patience, MD      . levothyroxine (SYNTHROID, LEVOTHROID) tablet 100 mcg  100  mcg Oral Q0600 Rise Patience, MD   100 mcg at 03/29/18 0556  . ondansetron (ZOFRAN) tablet 4 mg  4 mg Oral Q6H PRN Rise Patience, MD       Or  . ondansetron [facility]) injection 4 mg  4 mg Intravenous Q6H PRN Rise Patience, MD      . pantoprazole (PROTONIX) 80 mg in sodium chloride 0.9 % 250 mL (0.32 mg/mL) infusion  8 mg/hr Intravenous Continuous Rise Patience, MD   Stopped at 03/28/18 2202  . pantoprazole (PROTONIX) injection 40 mg  40 mg Intravenous Once Rise Patience, MD   Stopped at 03/28/18 1937   Facility-Administered Medications Ordered in Other Encounters  Medication Dose Route Frequency Provider Last Rate Last Dose  . sodium chloride flush (NS) 0.9 % injection 10 mL  10 mL Intracatheter PRN Heath Lark, MD        Allergies as of 03/28/2018 - Review Complete 03/28/2018  Allergen Reaction Noted  . Penicillins Other (See Comments) 08/13/2017     Review of Systems:    Constitutional: No weight loss, fever or chills Skin: No rash  Cardiovascular: No chest pain Respiratory: No SOB  Gastrointestinal: See HPI  and otherwise negative Genitourinary: No dysuria  Neurological: No headache, dizziness or syncope Musculoskeletal: No new muscle or joint pain Hematologic: No bruising Psychiatric: No history of depression or anxiety    Physical Exam:  Vital signs in last 24 hours: Temp:  [98 F (36.7 C)-100.5 F (38.1 C)] 98.7 F (37.1 C) (02/24 0315) Pulse Rate:  [73-103] 98 (02/24 0910) Resp:  [15-21] 21 (02/24 0910) BP: (107-155)/(53-112) 136/112 (02/24 0910) SpO2:  [91 %-100 %] 100 % (02/24 0910) Weight:  [78 kg] 78 kg (02/23 1819) Last BM Date: 03/29/18 General:   Pleasant AA male appears to be in NAD, Well developed, Well nourished, alert and cooperative Head:  Normocephalic and atraumatic. Eyes:   PEERL, EOMI. No icterus. Conjunctiva pink. Ears:  Normal auditory acuity. Neck:  Supple Throat: Oral cavity and pharynx without inflammation,  swelling or lesion.  Lungs: Respirations even and unlabored. Lungs clear to auscultation bilaterally.   No wheezes, crackles, or rhonchi. +O2 via [place] Heart: Normal S1, S2. No MRG. Regular rate and rhythm. No peripheral edema, cyanosis or pallor.  Abdomen:  Soft, nondistended, nontender. No rebound or guarding. Normal bowel sounds. No appreciable masses or hepatomegaly. Rectal:  Not performed.  Msk:  Symmetrical without gross deformities. Peripheral pulses intact.  Extremities:  Without edema, no deformity or joint abnormality. Neurologic:  Alert and  oriented x4;  grossly normal neurologically.  Skin:   Dry and intact without significant lesions or rashes. Psychiatric: Demonstrates good judgement and reason without abnormal affect or behaviors.  LAB RESULTS: Recent Labs    03/28/18 1758 03/29/18 0500  WBC 6.7 5.9  HGB 5.9* 8.2*  HCT 20.0* 25.9*  PLT 93* 88*   BMET Recent Labs    03/28/18 1758 03/29/18 0500  NA 137 139  K 5.3* 5.2*  CL 113* 114*  CO2 16* 16*  GLUCOSE 187* 166*  BUN 110* 106*  CREATININE 3.63* 3.57*  CALCIUM 8.3* 8.8*   LFT Recent Labs    03/29/18 0500  PROT 5.9*  ALBUMIN 3.1*  AST 12*  ALT 12  ALKPHOS 74  BILITOT 0.8  BILIDIR 0.1  IBILI 0.7   PT/INR Recent Labs    03/28/18 1758  LABPROT 15.0  INR 1.19    STUDIES: Ct Abdomen Pelvis Wo Contrast  Result Date: 03/28/2018 CLINICAL DATA:  Nausea, vomiting EXAM: CT ABDOMEN AND PELVIS WITHOUT CONTRAST TECHNIQUE: Multidetector CT imaging of the abdomen and pelvis was performed following the standard protocol without IV contrast. COMPARISON:  None. FINDINGS: Lower chest: No confluent opacities or effusions. Hepatobiliary: No focal hepatic abnormality. Gallbladder unremarkable. Pancreas: No focal abnormality or ductal dilatation. Spleen: Calcifications in the spleen.  Normal size. Adrenals/Urinary Tract: Renovascular calcifications. No urinary tract stones or hydronephrosis. Exophytic low-density lesion  off the lower pole of the left kidney likely reflects a cyst although this cannot be fully characterized on this unenhanced study. This measures 2 cm. Adrenal glands and urinary bladder unremarkable. Stomach/Bowel: Large stool burden throughout the colon. Normal appendix. Stomach and small bowel grossly unremarkable unenhanced appearance. Vascular/Lymphatic: 3.3 cm infrarenal abdominal aortic aneurysm extending into both common iliac arteries which measure 2.4 cm on the right and 2.2 cm on the left. No adenopathy. Reproductive: No visible focal abnormality. Other: No free fluid or free air. Musculoskeletal: No acute bony abnormality. IMPRESSION: Infrarenal abdominal aortic aneurysm, 3.3 cm maximally. Recommend followup by ultrasound in 3 years. This recommendation follows ACR consensus guidelines: White Paper of the ACR Incidental Findings Committee II on Vascular Findings. J  Am Coll Radiol 2013; 10:789-794. Aortic aneurysm NOS (ICD10-I71.9) Large stool burden throughout the colon. No acute findings in the abdomen or pelvis. Electronically Signed   By: Rolm Baptise M.D.   On: 03/28/2018 21:47   Dg Chest 2 View  Result Date: 03/28/2018 CLINICAL DATA:  Shortness of breath EXAM: CHEST - 2 VIEW COMPARISON:  09/23/2017 FINDINGS: Cardiomegaly. Prior CABG. No confluent airspace opacities, effusions or edema. No acute bony abnormality. IMPRESSION: Cardiomegaly.  No active disease. Electronically Signed   By: Rolm Baptise M.D.   On: 03/28/2018 20:07    Impression / Plan:   Impression: 1.  Hematemesis: X3 episodes per witnesses at his living facility, none since admission, no further nausea; consider PUD versus gastritis versus Mallory-Weiss tear 2.  Anemia: Hemoglobin 5.9 (from 7.9 in Oct 2019)-->2 u prbcs-->8.2; consider relation to above 3.  CAD status post CABG 4.  Left AKA due to PVD  Plan: 1.  Plan for EGD today with Dr. Rush Landmark.  Did discuss risks, benefits, limitations and alternatives and the  patient agrees to proceed.  This is scheduled at 1245. 2.  Patient to remain n.p.o. until after time of procedure. 3.  Agree with IV PPI twice daily 4.  Continue to monitor hemoglobin and transfuse as needed less than 7 5.  Please await any further recommendations from Dr. Rush Landmark after time of procedure today.  Thank you for your kind consultation, we will continue to follow.  Lavone Nian [facility]  03/29/2018, 10:31 AM

## 2018-03-29 NOTE — H&P (Addendum)
History and Physical    Leonard [name] FTD:[id number] DOB: 02-Nov-1950 DOA: 03/28/2018  PCP: Kathrene Alu, MD  Patient coming from: Skilled nursing facility.  Chief Complaint: Low hemoglobin.  HPI: Leonard [name] is a 68 y.o. male with history of CAD status post CABG, peripheral vascular disease status post left AKA, diabetes mellitus, diastolic CHF, hypertension, hypothyroidism, diabetes mellitus was brought to the ER the patient has been feeling weak and labs revealed decreasing hemoglobin.  Patient states that yesterday patient had some epigastric discomfort and had at least 3 episodes of hematemesis.  Denies any diarrhea.  ED Course: In the ER hemoglobin was 5.9 drop of about 2 g from October 2019.  CT abdomen pelvis was unremarkable.  Mildly febrile on arrival but subsequent vital signs were normal.  Patient was started on Protonix infusion and Dr. Enis Gash gastroenterologist on-call was consulted.  Review of Systems: As per HPI, rest all negative.   Past Medical History:  Diagnosis Date  . Acute on chronic diastolic heart failure ([place]) 07/04/2014  . Anemia of renal disease   . Atherosclerotic peripheral vascular disease with gangrene ([place]) 02/14/2017  . CAD (coronary artery disease) 04/06/2013  . Cancer ([place])    skin cancer-  . CHF (congestive heart failure) ([place])   . Chronic anemia 02/28/2016  . Chronic deep vein thrombosis (DVT) of femoral vein of left lower extremity ([place]) 09/03/2016  . Chronic respiratory failure with hypoxia ([place]) 08/25/2017  . CKD (chronic kidney disease) stage 4, GFR 15-29 ml/min (HCC) 01/15/2015   Follows with [facility].  Last visits:  07/23/16: No change in meds. Worried that if we increase diuretics could lead to another AKI  . CKD (chronic kidney disease), stage IV ([place])    07/07/2017  . Claudication ([place])   . Collapse of left talus 01/14/2017  . COPD GOLD III  02/16/2014   Quit smoking 2014  - PFT's  04/06/13   FEV1 1.28 (44 % ) ratio 48  p no  % improvement from saba p ?  prior to study with DLCO  43/54 % corrects to 97  % for alv volume - 01/31/2016  After extensive coaching HFA effectiveness =    75% from baseline 0 > changed to sprivia respimat   - PFT's  03/17/2016  FEV1 2.25  (81 % ) ratio 88  p 2 % improvement from saba p spiriva respimat x 2  prior to study with  . Coronary artery disease   . Diabetes ([place])   . Diabetic polyneuropathy associated with type 2 diabetes mellitus ([place]) 01/14/2017  . Diabetic retinopathy ([place]) 11/13/2015   Per Dr. Erenest Rasher eye exam in 10/17/15, mild background diabetic retinopathy  . Fatigue associated with anemia 01/21/2016  . Gangrene of toe ([place])    right 5th/notes 01/04/2013   . HCAP (healthcare-associated pneumonia)   . Hematuria 06/27/2015  . High cholesterol   . History of tobacco use 08/23/2013   Smoked pack per day for 46 years Quit smoking - 02/03/2013   . Hypertension   . Hypothyroidism 11/22/2014  . Idiopathic chronic gout of left foot without tophus   . Iron deficiency anemia 08/03/2015  . Left foot pain 10/29/2016  . Long term (current) use of anticoagulants [Z79.01] 09/09/2016  . 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 Mixed hyperlipidemia 07/17/2008  . Morbid obesity due to excess calories ([place]) 03/17/2016  . PAD (peripheral artery disease) ([place])   . Pleural effusion   . Pneumonitis 07/26/2017  .  Pulmonary artery hypertension ([place]) 07/25/2017  . PVD - hx of Rt SFA PTA and s/p Rt 4-5th toe amp Dec 2014 04/05/2013  . 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 Respiratory failure ([place]) 08/13/2017  . Respiratory failure with hypoxia ([place]) 09/23/2017  . 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 Secondary hypertension   . Shortness of breath dyspnea    with exertion  . Skin lesion of face 02/22/2015  . SOB (shortness of breath)   . Type 2 diabetes mellitus with pressure callus ([place]) 12/29/2016  . Type II diabetes mellitus (HCC)    Type II  . Type II diabetes mellitus with complication  [facility])     Past Surgical History:  Procedure Laterality Date  . ABDOMINAL AORTOGRAM W/LOWER EXTREMITY N/A 03/12/2017   Procedure: ABDOMINAL AORTOGRAM W/LOWER EXTREMITY;  Surgeon: Conrad Ripley, MD;  Location: [place] CV LAB;  Service: Cardiovascular;  Laterality: N/A;  . AMPUTATION Right 01/28/2013   Procedure: RAY AMPUTATION RIGHT  4th & 5th TOE;  Surgeon: Rosetta Posner, MD;  Location: [place];  Service: Vascular;  Laterality: Right;  . AMPUTATION Left 03/15/2017   Procedure: AMPUTATION ABOVE KNEE;  Surgeon: Waynetta Sandy, MD;  Location: [place];  Service: Vascular;  Laterality: Left;  . ANGIOPLASTY / STENTING FEMORAL Right 01/13/2013   superficial femoral artery x 2 (3 mm x 60 mm, 4 mm x 60 mm)  . AV FISTULA PLACEMENT Left 02/11/2016   Procedure: LEFT UPPER ARM BRACHIAL CEPHALIC ARTERIOVENOUS (AV) FISTULA CREATION;  Surgeon: Conrad Gosport, MD;  Location: [place];  Service: Vascular;  Laterality: Left;  . COLONOSCOPY W/ POLYPECTOMY    . CORONARY ARTERY BYPASS GRAFT N/A 04/07/2013   Procedure: CORONARY ARTERY BYPASS GRAFTING (CABG);  Surgeon: Ivin Poot, MD;  Location: [place];  Service: Open Heart Surgery;  Laterality: N/A;  . INTRAOPERATIVE TRANSESOPHAGEAL ECHOCARDIOGRAM N/A 04/07/2013   Procedure: INTRAOPERATIVE TRANSESOPHAGEAL ECHOCARDIOGRAM;  Surgeon: Ivin Poot, MD;  Location: [place];  Service: Open Heart Surgery;  Laterality: N/A;  . IR FLUORO GUIDE CV LINE RIGHT  11/04/2017  . IR REMOVAL TUN CV CATH W/O FL  11/27/2017  . IR US GUIDE VASC ACCESS RIGHT  11/04/2017  . LEFT HEART CATHETERIZATION WITH CORONARY ANGIOGRAM N/A 04/05/2013   Procedure: LEFT HEART CATHETERIZATION WITH CORONARY ANGIOGRAM;  Surgeon: Peter M Martinique, MD;  Location[facility] C[facility] LAB;  Service: Cardiovascular;  Laterality: N/A;  . LOWER EXTREMITY ANGIOGRAM Bilateral 01/06/2013   Procedure: LOWER EXTREMITY ANGIOGRAM;  Surgeon: Conrad Ferrelview, MD;  Location: [facility] CATH LAB;  Service: Cardiovascular;  Laterality: Bilateral;  .  LOWER EXTREMITY ANGIOGRAM Right 01/13/2013   Procedure: LOWER EXTREMITY ANGIOGRAM;  Surgeon: Conrad Monango, MD;  Location: [facility] CATH LAB;  Service: Cardiovascular;  Laterality: Right;  . SKIN CANCER EXCISION  2017  . THROAT SURGERY  1983   polyps  . TONSILLECTOMY AND ADENOIDECTOMY  ~ 1965     reports that he quit smoking about 5 years ago. His smoking use included cigarettes. He started smoking about 51 years ago. He has a 46.00 pack-year smoking history. He has never used smokeless tobacco. He reports that he does not drink alcohol or use drugs.  Allergies  Allergen Reactions  . Penicillins Other (See Comments)    Has patient had a PCN reaction causing immediate rash, facial/tongue/throat swelling, SOB or lightheadedness with hypotension: Unknown Has patient had a PCN reaction causing severe rash involving mucus membranes or skin necrosis: Unknown Has patient had a PCN  reaction that required hospitalization: Unknown Has patient had a PCN reaction occurring within the last 10 years: Unknown If all of the above answers are "NO", then may proceed with Cephalosporin use.     Family History  Problem Relation Age of Onset  . Cancer Mother        lung cancer  . Hypertension Mother   . Cancer Sister        patient thinks it was uterine cancer  . Hypertension Sister   . Heart attack Sister   . Stroke Neg Hx     Prior to Admission medications   Medication Sig Start Date End Date Taking? Authorizing Provider  acetaminophen (TYLENOL) 325 MG tablet Take 650 mg by mouth every 8 (eight) hours as needed for mild pain or fever.     [provider]  aspirin EC 81 MG tablet Take 1 tablet (81 mg total) by mouth daily. 08/07/17 08/07/18  Steve Rattler, DO  atorvastatin (LIPITOR) 40 MG tablet TAKE ONE (1) TABLET BY MOUTH EVERY DAY AT 6:00PM Patient taking differently: Take 40 mg by mouth daily at 6 PM.  11/17/16   Larey Dresser, MD  Blood Glucose Monitoring Suppl ([place]) w/Device KIT 1 kit by Does not apply route daily. 10/10/16   McDiarmid, Blane Ohara, MD  carvedilol (COREG) 12.5 MG tablet Take 1 tablet (12.5 mg total) by mouth 2 (two) times daily with a meal. 08/30/17   Meccariello, Bernita Raisin, DO  cloNIDine (CATAPRES - DOSED IN MG/24 HR) 0.2 mg/24hr patch Place 1 patch (0.2 mg total) onto the skin once a week. Patient taking differently: Place 0.2 mg onto the skin every Monday.  08/09/17   Steve Rattler, DO  Darbepoetin Alfa (ARANESP) 40 MCG/0.4ML SOSY injection Inject 0.4 mLs (40 mcg total) into the skin every 30 (thirty) days. 09/28/17   Meccariello, Bernita Raisin, DO  doxazosin (CARDURA) 8 MG tablet Take 1 tablet (8 mg total) by mouth every evening. 12/01/16   Zenia Resides, MD  feeding supplement, ENSURE ENLIVE, (ENSURE ENLIVE) LIQD Take 237 mLs by mouth 3 (three) times daily between meals. 08/07/17   Steve Rattler, DO  ferrous sulfate (FEROSUL) 325 (65 FE) MG tablet Take 1 tablet (325 mg total) by mouth daily at 12 noon. 08/31/17   Meccariello, Bernita Raisin, DO  glucose blood (ONETOUCH VERIO) test strip Use as instructed to test three times daily. ICD-10 code: E11.22 10/15/16   McDiarmid, Blane Ohara, MD  hydrALAZINE (APRESOLINE) 100 MG tablet TAKE ONE TABLET BY MOUTH EVERY EIGHT HOURS Patient taking differently: Take 100 mg by mouth every 8 (eight) hours.  11/14/16   [name], MD  ipratropium-albuterol (DUONEB) 0.5-2.5 (3) MG/3ML SOLN Take 3 mLs by nebulization 4 (four) times daily as needed (shortness of breath and wheezing).     [provider]  isosorbide mononitrate (IMDUR) 120 MG 24 hr tablet Take 1 tablet (120 mg total) by mouth daily. 10/02/17   Anderson, Chelsey L, DO  levothyroxine (SYNTHROID, LEVOTHROID) 100 MCG tablet Take 100 mcg by mouth daily before breakfast.     [provider]  [facility] MAGNAIR STARTER KIT 25 MCG/ML SOLN Inhale 1 mL into the lungs 2 (two) times daily. 09/21/17   [provider]  nitroGLYCERIN  (NITROSTAT) 0.4 MG SL tablet Place 1 tablet (0.4 mg total) under the tongue every 5 (five) minutes as needed for chest pain. Patient not taking: Reported on 02/16/2018 11/14/16   Smitty Cords  M, MD  NOVOLOG 100 UNIT/ML injection  11/13/17   [provider]  omeprazole (PRILOSEC) 20 MG capsule  10/02/17   [provider]  [facility] DELICA LANCETS 22Q MISC 1 each by Does not apply route 3 (three) times daily. ICD-10 code: E11.22 10/15/16   McDiarmid, Blane Ohara, MD  polyethylene glycol [facility] / Floria Raveling) packet Take 17 g by mouth daily as needed for mild constipation or moderate constipation. Patient taking differently: Take 17 g by mouth every 8 (eight) hours as needed for mild constipation.  08/07/17   Steve Rattler, DO  potassium chloride SA (K-DUR,KLOR-CON) 20 MEQ tablet Take 2 tablets (40 mEq total) by mouth daily. 02/16/18   Larey Dresser, MD  torsemide (DEMADEX) 20 MG tablet Take 2 tablets (40 mg total) by mouth daily. 02/16/18   Larey Dresser, MD    Physical Exam: Vitals:   03/28/18 2215 03/28/18 2245 03/28/18 2344 03/29/18 0026  BP: (!) 155/60 (!) 152/73 117/78 (!) 127/53  Pulse: 73 (!) 103 80 100  Resp: _0 Temp:   98.4 F (36.9 C) 98 F (36.7 C)  TempSrc:   Oral Oral  SpO2: 100% 100% 100% 100%  Weight:      Height:          Constitutional: Moderately built and nourished. Vitals:   03/28/18 2215 03/28/18 2245 03/28/18 2344 03/29/18 0026  BP: (!) 155/60 (!) 152/73 117/78 (!) 127/53  Pulse: 73 (!) 103 80 100  Resp: _1 Temp:   98.4 F (36.9 C) 98 F (36.7 C)  TempSrc:   Oral Oral  SpO2: 100% 100% 100% 100%  Weight:      Height:       Eyes: Anicteric no pallor. ENMT: No discharge from the ears eyes nose and mouth. Neck: No mass felt.  No neck rigidity.  No JVD appreciated. Respiratory: No rhonchi or crepitations. Cardiovascular: S1-S2 heard. Abdomen: Soft nontender bowel sounds present. Musculoskeletal: No edema.  No joint  effusion. Skin: No rash. Neurologic: Patient was initially mildly lethargic but soon became more alert and awake. Psychiatric: Initial lethargy became more alert awake subsequently.   Labs on Admission: I have personally reviewed following labs and imaging studies  CBC: Recent Labs  Lab 03/28/18 1758  WBC 6.7  NEUTROABS 5.3  HGB 5.9*  HCT 20.0*  MCV 90.9  PLT 93*   Basic Metabolic Panel: Recent Labs  Lab 03/28/18 1758  NA 137  K 5.3*  CL 113*  CO2 16*  GLUCOSE 187*  BUN 110*  CREATININE 3.63*  CALCIUM 8.3*   GFR: Estimated Creatinine Clearance: 19.4 mL/min (A) (by C-G formula based on SCr of 3.63 mg/dL (H)). Liver Function Tests: Recent Labs  Lab 03/28/18 1758  AST 11*  ALT 11  ALKPHOS 72  BILITOT 0.5  PROT 5.6*  ALBUMIN 3.0*   No results for input(s): LIPASE, AMYLASE in the last 168 hours. Recent Labs  Lab 03/28/18 1758  AMMONIA 33   Coagulation Profile: Recent Labs  Lab 03/28/18 1758  INR 1.19   Cardiac Enzymes: Recent Labs  Lab 03/28/18 1758  TROPONINI <0.03   BNP (last 3 results) No results for input(s): PROBNP in the last 8760 hours. HbA1C: No results for input(s): HGBA1C in the last 72 hours. CBG: No results for input(s): GLUCAP in the last 168 hours. Lipid Profile: No results for input(s): CHOL, HDL, LDLCALC, TRIG, CHOLHDL, LDLDIRECT in the last 72 hours. Thyroid Function Tests:  No results for input(s): TSH, T4TOTAL, FREET4, T3FREE, THYROIDAB in the last 72 hours. Anemia Panel: No results for input(s): VITAMINB12, FOLATE, FERRITIN, TIBC, IRON, RETICCTPCT in the last 72 hours. Urine analysis:    Component Value Date/Time   COLORURINE YELLOW 09/23/2017 1658   APPEARANCEUR CLOUDY (A) 09/23/2017 1658   LABSPEC 1.012 09/23/2017 1658   PHURINE 5.0 09/23/2017 1658   GLUCOSEU NEGATIVE 09/23/2017 1658   HGBUR SMALL (A) 09/23/2017 1658   BILIRUBINUR NEGATIVE 09/23/2017 1658   BILIRUBINUR NEG 06/26/2015 1540   KETONESUR NEGATIVE  09/23/2017 1658   PROTEINUR >=300 (A) 09/23/2017 1658   UROBILINOGEN 1.0 06/26/2015 1540   UROBILINOGEN 0.2 Nov 03, 202015 2156   NITRITE NEGATIVE 09/23/2017 1658   LEUKOCYTESUR TRACE (A) 09/23/2017 1658   Sepsis Labs: _0 (procalcitonin:4,lacticidven:4) )No results found for this or any previous visit (from the past 240 hour(s)).   Radiological Exams on Admission: Ct Abdomen Pelvis Wo Contrast  Result Date: 03/28/2018 CLINICAL DATA:  Nausea, vomiting EXAM: CT ABDOMEN AND PELVIS WITHOUT CONTRAST TECHNIQUE: Multidetector CT imaging of the abdomen and pelvis was performed following the standard protocol without IV contrast. COMPARISON:  None. FINDINGS: Lower chest: No confluent opacities or effusions. Hepatobiliary: No focal hepatic abnormality. Gallbladder unremarkable. Pancreas: No focal abnormality or ductal dilatation. Spleen: Calcifications in the spleen.  Normal size. Adrenals/Urinary Tract: Renovascular calcifications. No urinary tract stones or hydronephrosis. Exophytic low-density lesion off the lower pole of the left kidney likely reflects a cyst although this cannot be fully characterized on this unenhanced study. This measures 2 cm. Adrenal glands and urinary bladder unremarkable. Stomach/Bowel: Large stool burden throughout the colon. Normal appendix. Stomach and small bowel grossly unremarkable unenhanced appearance. Vascular/Lymphatic: 3.3 cm infrarenal abdominal aortic aneurysm extending into both common iliac arteries which measure 2.4 cm on the right and 2.2 cm on the left. No adenopathy. Reproductive: No visible focal abnormality. Other: No free fluid or free air. Musculoskeletal: No acute bony abnormality. IMPRESSION: Infrarenal abdominal aortic aneurysm, 3.3 cm maximally. Recommend followup by ultrasound in 3 years. This recommendation follows ACR consensus guidelines: White Paper of the ACR Incidental Findings Committee II on Vascular Findings. J Am Coll Radiol 2013; 10:789-794.  Aortic aneurysm NOS (ICD10-I71.9) Large stool burden throughout the colon. No acute findings in the abdomen or pelvis. Electronically Signed   By: Rolm Baptise M.D.   On: 03/28/2018 21:47   Dg Chest 2 View  Result Date: 03/28/2018 CLINICAL DATA:  Shortness of breath EXAM: CHEST - 2 VIEW COMPARISON:  09/23/2017 FINDINGS: Cardiomegaly. Prior CABG. No confluent airspace opacities, effusions or edema. No acute bony abnormality. IMPRESSION: Cardiomegaly.  No active disease. Electronically Signed   By: Rolm Baptise M.D.   On: 03/28/2018 20:07    EKG: Independently reviewed -normal sinus rhythm.  Assessment/Plan Principal Problem:   Acute GI bleeding Active Problems:   S/P CABG x 3   Chronic diastolic CHF (congestive heart failure) (HCC)   COPD GOLD III    CKD (chronic kidney disease) stage 4, GFR 15-29 ml/min (HCC)   Symptomatic anemia    1. Acute GI bleeding -patient has been placed on Protonix infusion kept n.p.o. except medications for now and gastroenterologist has been consulted.  2 units of PRBC transfusion has been ordered repeat CBC after transfusion. 2. Hypertension on clonidine patch will continue Coreg PRN IV hydralazine. 3. Chronic diastolic CHF usually takes torsemide on hold for now.  Follow respiratory status closely. 4. COPD not actively wheezing at this time. 5. Chronic disease stage IV creatinine appears  to be at baseline. 6. History of CAD status post CABG denies any chest pain did have some epigastric pain. 7. Diabetes mellitus type 2 we will keep patient on sliding scale coverage. 8. Peripheral vascular disease.  9. Infrarenal aortic aneurysm will need follow.  Patient was mildly lethargic on arrival has become more alert awake later.   DVT prophylaxis: SCDs. Code Status: Full code. Family Communication: Discussed with patient. Disposition Plan: Back to the facility when stable. Consults called: Copywriter, advertising. Admission status: Inpatient.   Rise Patience MD [facility] Pager [phone].  If 7PM-7AM, please contact night-coverage [email or web address] Password [email or web address]  03/29/2018, 1:06 AM

## 2018-03-29 NOTE — Care Management Note (Signed)
Case Management Note  Patient Details  Name: Leonard [name] MRN: [id number] Date of Birth: 06-06-1950  Subjective/Objective:  Pt admitted on 03/28/2018 with anemia and hematemesis.  PTA, pt resided at [facility], where he is in long term care.  He is essentially wheelchair bound.                  Action/Plan: CSW following to facilitate return to SNF upon medical stability.  Will follow to assist with dc planning as needed.  Expected Discharge Date:                  Expected Discharge Plan:  Skilled Nursing Facility  In-House Referral:  Clinical Social Work  Discharge planning Services  CM Consult  Post Acute Care Choice:    Choice offered to:     DME Arranged:    DME Agency:     HH Arranged:    [place] Agency:     Status of Service:  In process, will continue to follow  If discussed at Long Length of Stay Meetings, dates discussed:    Additional Comments:  Reinaldo Raddle, RN, BSN  Trauma/Neuro ICU Case Manager [phone]

## 2018-03-29 NOTE — Progress Notes (Signed)
Patient seen and examined at bedside, patient admitted after midnight, please see earlier detailed admission note by Rise Patience, MD. Briefly, patient presented with anemia with evidence of hematemesis. Patient received 2 units of PRBC. GI consulted with plan for EG. Started on Protonix IV.   Cordelia Poche, MD [facility] 03/29/2018, 10:34 AM

## 2018-03-29 NOTE — Anesthesia Preprocedure Evaluation (Signed)
Anesthesia Evaluation  Patient identified by MRN, date of birth, ID band Patient awake    Reviewed: Allergy & Precautions, NPO status , Patient's Chart, lab work & pertinent test results, reviewed documented beta blocker date and time   History of Anesthesia Complications Negative for: history of anesthetic complications  Airway Mallampati: II  TM Distance: >3 FB     Dental  (+) Dental Advisory Given, Edentulous Upper, Edentulous Lower,    Pulmonary shortness of breath, COPD, former smoker,    breath sounds clear to auscultation       Cardiovascular hypertension, Pt. on home beta blockers and Pt. on medications + CAD, + CABG, + Peripheral Vascular Disease and +CHF   Rhythm:Regular Rate:Tachycardia     Neuro/Psych  Neuromuscular disease    GI/Hepatic Neg liver ROS, ? GI bleed   Endo/Other  diabetesHypothyroidism   Renal/GU CRFRenal disease     Musculoskeletal  (+) Arthritis ,   Abdominal   Peds  Hematology  (+) Blood dyscrasia, anemia ,   Anesthesia Other Findings Left ventricle: Abnormal global LV strain. The cavity size was   normal. Wall thickness was increased in a pattern of moderate to   severe LVH. Systolic function was normal. The estimated ejection   fraction was in the range of 50% to 55%. Wall motion was normal;   there were no regional wall motion abnormalities. - Aortic root: The aortic root was mildly dilated. - Mitral valve: Mildly calcified annulus. There was mild   regurgitation. - Left atrium: The atrium was moderately dilated.  Reproductive/Obstetrics                             Anesthesia Physical Anesthesia Plan  ASA: IV and emergent  Anesthesia Plan: MAC   Post-op Pain Management:    Induction:   PONV Risk Score and Plan: 1 and Treatment may vary due to age or medical condition and Propofol infusion  Airway Management Planned: Nasal Cannula  Additional  Equipment:   Intra-op Plan:   Post-operative Plan:   Informed Consent: I have reviewed the patients History and Physical, chart, labs and discussed the procedure including the risks, benefits and alternatives for the proposed anesthesia with the patient or authorized representative who has indicated his/her understanding and acceptance.     Dental advisory given  Plan Discussed with: CRNA and Surgeon  Anesthesia Plan Comments:         Anesthesia Quick Evaluation

## 2018-03-29 NOTE — [place] FL2 (Signed)
[place] MEDICAID FL2 LEVEL OF CARE SCREENING TOOL     IDENTIFICATION  [name] Birthdate: [birth year] Sex: male Admission Date (Current Location): 03/28/2018  [place] and [place] Number:  Herbalist and Address:  The . [facility], [place] [address], [place], [place] [address]      Provider Number: [id number]  Attending Physician Name and Address:  Mariel Aloe, MD  Relative Name and Phone Number:  Gillis Santa; sister; [phone]    Current Level of Care: Hospital Recommended Level of Care: [place] Prior Approval Number:    Date Approved/Denied:   PASRR Number: [id number]  Discharge Plan: SNF    Current Diagnoses: Patient Active Problem List   Diagnosis Date Noted  . Symptomatic anemia 03/29/2018  . Acute GI bleeding 03/28/2018  . Dysphagia 10/01/2017  . Palliative care by specialist   . Severe comorbid illness   . Moderate malnutrition ([place]) 08/27/2017  . Hypoxemia   . Atelectasis   . Chronic respiratory failure with hypoxia ([place]) 08/25/2017  . Pulmonary artery hypertension ([place]) 07/25/2017  . Pressure injury of skin 07/20/2017  . Goals of care, counseling/discussion 02/11/2017  . Diabetic polyneuropathy associated with type 2 diabetes mellitus ([place]) 01/14/2017  . Pleural effusion   . Chronic anemia 02/28/2016  . Diabetic retinopathy ([place]) 11/13/2015  . Iron deficiency anemia 08/03/2015  . Type II diabetes mellitus with complication ([place])   . Chronic kidney disease (CKD), stage III (moderate) (HCC)   . 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 Deficiency anemia 01/15/2015  . CKD (chronic kidney disease) stage 4, GFR 15-29 ml/min (HCC) 01/15/2015  . Hypothyroidism 11/22/2014  . COPD GOLD III  02/16/2014  . Anemia of renal disease   . Chronic diastolic CHF (congestive heart failure) ([place])   . History of tobacco use 08/23/2013  . 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 CAD  (coronary artery disease) 04/06/2013  . PVD - hx of Rt SFA PTA and s/p Rt 4-5th toe amp Dec 2014 04/05/2013  . 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)                       Personal Care Assistance Level of Assistance  Bathing, Feeding, Dressing Bathing Assistance: Maximum assistance Feeding assistance: Independent Dressing Assistance: Maximum assistance     Functional Limitations Info  Sight, Hearing, Speech Sight Info: Adequate Hearing Info: Adequate Speech Info: Adequate    SPECIAL CARE FACTORS FREQUENCY  PT (By licensed PT), OT (By licensed OT)     PT Frequency: 5x week OT Frequency: 5x week            Contractures Contractures Info: Not present    Additional Factors Info  Code Status, Allergies, Insulin Sliding Scale Code Status Info: Full Code Allergies Info: PENICILLINS    Insulin Sliding Scale Info: insulin aspart (novoLOG) injection 0-9 Units every 4 hours       Current Medications (03/29/2018):  This is the current hospital active medication list  Current Facility-Administered Medications  Medication Dose Route Frequency Provider Last Rate Last Dose  . acetaminophen (TYLENOL) tablet 650 mg  650 mg Oral Q6H PRN Rise Patience, MD       Or  . acetaminophen (TYLENOL) suppository 650 mg  650 mg Rectal Q6H PRN Rise Patience, MD      . carvedilol (COREG) tablet 12.5 mg  12.5 mg Oral BID WC Rise Patience, MD      . cloNIDine (CATAPRES - Dosed in mg/24 hr) patch 0.2 mg  0.2 mg  Transdermal Q Mon Kakrakandy, Varnell N, MD      . hydrALAZINE (APRESOLINE) injection 10 mg  10 mg Intravenous Q4H PRN Rise Patience, MD      . insulin aspart (novoLOG) injection 0-9 Units  0-9 Units Subcutaneous Q4H Rise Patience, MD   2 Units at 03/29/18 [phone]  . ipratropium-albuterol (DUONEB) 0.5-2.5 (3) MG/3ML nebulizer solution 3 mL  3 mL Nebulization QID PRN Rise Patience, MD      . levothyroxine (SYNTHROID, LEVOTHROID) tablet 100 mcg  100 mcg Oral Q0600 Rise Patience, MD   100 mcg at 03/29/18 0556  . ondansetron (ZOFRAN) tablet 4 mg  4 mg Oral Q6H PRN Rise Patience, MD       Or  . ondansetron [facility]) injection 4 mg  4 mg Intravenous Q6H PRN Rise Patience, MD      . pantoprazole (PROTONIX) 80 mg in sodium chloride 0.9 % 250 mL (0.32 mg/mL) infusion  8 mg/hr Intravenous Continuous Rise Patience, MD   Stopped at 03/28/18 2202  . pantoprazole (PROTONIX) injection 40 mg  40 mg Intravenous Once Rise Patience, MD   Stopped at 03/28/18 1937   Facility-Administered Medications Ordered in Other Encounters  Medication Dose Route Frequency Provider Last Rate Last Dose  . sodium chloride flush (NS) 0.9 % injection 10 mL  10 mL Intracatheter PRN Heath Lark, MD         Discharge Medications: Please see discharge summary for a list of discharge medications.  Relevant Imaging Results:  Relevant Lab Results:   Additional Information SS#: [place], [place]

## 2018-03-30 ENCOUNTER — Encounter (HOSPITAL_COMMUNITY): Payer: Medicare Other

## 2018-03-30 ENCOUNTER — Telehealth: Payer: Self-pay | Admitting: Family Medicine

## 2018-03-30 DIAGNOSIS — C169 Malignant neoplasm of stomach, unspecified: Secondary | ICD-10-CM

## 2018-03-30 LAB — CBC
HCT: 21.7 % — ABNORMAL LOW (ref 39.0–52.0)
Hemoglobin: 6.8 g/dL — CL (ref 13.0–17.0)
MCH: 27.4 pg (ref 26.0–34.0)
MCHC: 31.3 g/dL (ref 30.0–36.0)
MCV: 87.5 fL (ref 80.0–100.0)
Platelets: 97 10*3/uL — ABNORMAL LOW (ref 150–400)
RBC: 2.48 MIL/uL — ABNORMAL LOW (ref 4.22–5.81)
RDW: 16.9 % — ABNORMAL HIGH (ref 11.5–15.5)
WBC: 4.6 10*3/uL (ref 4.0–10.5)
nRBC: 0 % (ref 0.0–0.2)

## 2018-03-30 LAB — GLUCOSE, CAPILLARY
Glucose-Capillary: 122 mg/dL — ABNORMAL HIGH (ref 70–99)
Glucose-Capillary: 144 mg/dL — ABNORMAL HIGH (ref 70–99)
Glucose-Capillary: 157 mg/dL — ABNORMAL HIGH (ref 70–99)
Glucose-Capillary: 169 mg/dL — ABNORMAL HIGH (ref 70–99)
Glucose-Capillary: 169 mg/dL — ABNORMAL HIGH (ref 70–99)
Glucose-Capillary: 184 mg/dL — ABNORMAL HIGH (ref 70–99)

## 2018-03-30 LAB — HEMOGLOBIN AND HEMATOCRIT, BLOOD
HCT: 24.2 % — ABNORMAL LOW (ref 39.0–52.0)
Hemoglobin: 8 g/dL — ABNORMAL LOW (ref 13.0–17.0)

## 2018-03-30 LAB — PREPARE RBC (CROSSMATCH)

## 2018-03-30 MED ORDER — SODIUM CHLORIDE 0.9% IV SOLUTION
Freq: Once | INTRAVENOUS | Status: AC
  Administered 2018-03-30: 12:00:00 via INTRAVENOUS

## 2018-03-30 NOTE — Telephone Encounter (Signed)
Opened in error

## 2018-03-30 NOTE — Progress Notes (Addendum)
Progress Note   Subjective  Chief Complaint: Epigastric pain, hematemesis, anemia, status post EGD 03/29/2018  Today, the patient tells me he is doing well, he has had no further nausea or vomiting and is tolerating his diet.  No abdominal pain.  Per nursing the sister would like to be contacted in regards to results from yesterday.   Objective   Vital signs in last 24 hours: Temp:  [98 F (36.7 C)-98.6 F (37 C)] 98.2 F (36.8 C) (02/25 0807) Pulse Rate:  [95-103] 102 (02/25 0807) Resp:  [15-25] 18 (02/25 0807) BP: (130-184)/(62-84) 178/76 (02/25 0807) SpO2:  [99 %-100 %] 100 % (02/25 0807) Last BM Date: 03/30/18 General:   AA male in NAD Heart:  Regular rate and rhythm; no murmurs Lungs: Respirations even and unlabored, lungs CTA bilaterally Abdomen:  Soft, nontender and nondistended. Normal bowel sounds. Extremities:  Without edema. Neurologic:  Alert and oriented,  grossly normal neurologically. Psych:  Cooperative. Normal mood and affect.  Intake/Output from previous day: 02/24 0701 - 02/25 0700 In: 707.1 [P.O.:120; I.V.:587.1] Out: 700 [Urine:700] Intake/Output this shift: Total I/O In: 240 [P.O.:240] Out: -   Lab Results: Recent Labs    03/28/18 1758 03/29/18 0500  WBC 6.7 5.9  HGB 5.9* 8.2*  HCT 20.0* 25.9*  PLT 93* 88*   BMET Recent Labs    03/28/18 1758 03/29/18 0500  NA 137 139  K 5.3* 5.2*  CL 113* 114*  CO2 16* 16*  GLUCOSE 187* 166*  BUN 110* 106*  CREATININE 3.63* 3.57*  CALCIUM 8.3* 8.8*   LFT Recent Labs    03/29/18 0500  PROT 5.9*  ALBUMIN 3.1*  AST 12*  ALT 12  ALKPHOS 74  BILITOT 0.8  BILIDIR 0.1  IBILI 0.7   PT/INR Recent Labs    03/28/18 1758  LABPROT 15.0  INR 1.19    Studies/Results: Ct Abdomen Pelvis Wo Contrast  Result Date: 03/28/2018 CLINICAL DATA:  Nausea, vomiting EXAM: CT ABDOMEN AND PELVIS WITHOUT CONTRAST TECHNIQUE: Multidetector CT imaging of the abdomen and pelvis was performed following the  standard protocol without IV contrast. COMPARISON:  None. FINDINGS: Lower chest: No confluent opacities or effusions. Hepatobiliary: No focal hepatic abnormality. Gallbladder unremarkable. Pancreas: No focal abnormality or ductal dilatation. Spleen: Calcifications in the spleen.  Normal size. Adrenals/Urinary Tract: Renovascular calcifications. No urinary tract stones or hydronephrosis. Exophytic low-density lesion off the lower pole of the left kidney likely reflects a cyst although this cannot be fully characterized on this unenhanced study. This measures 2 cm. Adrenal glands and urinary bladder unremarkable. Stomach/Bowel: Large stool burden throughout the colon. Normal appendix. Stomach and small bowel grossly unremarkable unenhanced appearance. Vascular/Lymphatic: 3.3 cm infrarenal abdominal aortic aneurysm extending into both common iliac arteries which measure 2.4 cm on the right and 2.2 cm on the left. No adenopathy. Reproductive: No visible focal abnormality. Other: No free fluid or free air. Musculoskeletal: No acute bony abnormality. IMPRESSION: Infrarenal abdominal aortic aneurysm, 3.3 cm maximally. Recommend followup by ultrasound in 3 years. This recommendation follows ACR consensus guidelines: White Paper of the ACR Incidental Findings Committee II on Vascular Findings. J Am Coll Radiol 2013; 10:789-794. Aortic aneurysm NOS (ICD10-I71.9) Large stool burden throughout the colon. No acute findings in the abdomen or pelvis. Electronically Signed   By: Rolm Baptise M.D.   On: 03/28/2018 21:47   Dg Chest 2 View  Result Date: 03/28/2018 CLINICAL DATA:  Shortness of breath EXAM: CHEST - 2 VIEW COMPARISON:  09/23/2017 FINDINGS:  Cardiomegaly. Prior CABG. No confluent airspace opacities, effusions or edema. No acute bony abnormality. IMPRESSION: Cardiomegaly.  No active disease. Electronically Signed   By: Rolm Baptise M.D.   On: 03/28/2018 20:07   Dg Abd 2 Views  Result Date: 03/29/2018 CLINICAL DATA:   Acute left upper quadrant abdominal pain. EXAM: ABDOMEN - 2 VIEW COMPARISON:  CT scan of March 28, 2018. Radiograph of July 10, 2017. FINDINGS: The bowel gas pattern is normal. There is no evidence of free air. No radio-opaque calculi or other significant radiographic abnormality is seen. IMPRESSION: No evidence of bowel obstruction or ileus. Electronically Signed   By: Marijo Conception, M.D.   On: 03/29/2018 16:38    EGD 03/29/2018 Dr. Rush Landmark: Impression:               - No gross lesions in esophagus.                           - Non-bleeding erosive gastropathy.                           - Congested, erythematous, friable (with contact                            bleeding), granular, inflamed and texture changed                            mucosa in the prepyloric region of the stomach and                            pylorus.                           - Currently non-bleeding gastric ulcer with a                            possible flat pigmented spot (Forrest Class IIc)                            that encircled the entire pylorus. Biopsied.                           - No other gross lesions in the stomach. Biopsied                            for HP.                           - Erythematous duodenopathy and melanosis.                           - Normal mucosa was found in the second portion of                            the duodenum. Recommendation:           - The patient will be observed post-procedure,  until all discharge criteria are met.                           - Return patient to hospital ward for ongoing care.                           - Continue IV PPI BID for 72-hours.                           - Await pathology results. If they return negative                            for malignancy then I would plan to repeat EGD for                            repeat biopsies to be obtained.                           - Patient is going to be high risk of  stricturing                            disease if this ulcer heals and turns out to be                            non-malignant.                           - KUB 2-view to be performed later today (order                            placed).                           - Ideally would get a contrasted CT-Abdomen                            however, unable to do so because of his CRI.                           - Full liquid diet for next 48-hours and dependent                            on how he does may consider a soft, low-residue,                            low-fiber pureed diet to monitor closely.                           - If patient manifests transfusion depedent anemia                            with overt melena, then repeat EGD is reasonable  but there is a likelihood that he may require an IR                            intervention for possible GDA embolization or                            selective embolization.                           - The findings and recommendations were discussed                            with the patient.                           - The findings and recommendations were discussed                            with the referring physician.   Assessment / Plan:   Assessment: 1.  Hematemesis: 3 episodes witnessed at living facility, none since admission, no further nausea, epigastric pain better today, EGD yesterday with results as above including nonbleeding gastric ulcer and erosive gastropathy, KUB yesterday unremarkable 2.  Anemia: With above, yesterday hemoglobin 8.2 after 2 units PRBCs, CBC pending today 3.  CAD status post CABG 4.  Left AKA due to PVD  Plan: 1.  Ordered repeat CBC today. 2.  Continue IV PPI twice daily x72 hours total 3.  Again will await biopsy results for further recommendations 4.  Patient to continue on full liquid diet for at least 48 hours total, then may consider a soft/low residue/low fiber pured  diet 5.  See further recommendations after EGD as above 6.  Spoke with Sister Gillis Santa on the phone, as approved per patient, and discussed findings of EGD and what the plan is for now.  Answered all of her questions.  She does tell me she will be in the room after about 1:00 this afternoon if we have any other news to share. 7.  Please await any further recommendations from Dr. Rush Landmark later today  Thank you for your kind consultation, we will continue to follow.    LOS: 2 days   Levin Erp  03/30/2018, 10:17 AM

## 2018-03-30 NOTE — Progress Notes (Signed)
Spoke with Dr. Lonny Prude this morning about patient case.  Leonard [name] is a family medicine patient, sees Dr. Shan Levans in our clinic.  We will be happy to assume his care the morning of 2/26.  Appreciate the communication and warm handoff by Dr. Lonny Prude.  Patriciaann Clan, DO

## 2018-03-30 NOTE — Anesthesia Postprocedure Evaluation (Signed)
Anesthesia Post Note  Patient: Leonard [name]  Procedure(s) Performed: ESOPHAGOGASTRODUODENOSCOPY (EGD) WITH PROPOFOL (N/A ) BIOPSY     Patient location during evaluation: Endoscopy Anesthesia Type: MAC Level of consciousness: awake and alert Pain management: pain level controlled Vital Signs Assessment: post-procedure vital signs reviewed and stable Respiratory status: spontaneous breathing, nonlabored ventilation, respiratory function stable and patient connected to nasal cannula oxygen Cardiovascular status: stable and blood pressure returned to baseline Postop Assessment: no apparent nausea or vomiting Anesthetic complications: no    Last Vitals:  Vitals:   03/30/18 1631 03/30/18 1914  BP: (!) 170/77 (!) 161/76  Pulse: (!) 103 98  Resp: 18 17  Temp: 36.9 C 36.8 C  SpO2: 100% 100%    Last Pain:  Vitals:   03/30/18 1914  TempSrc: Oral  PainSc:                  Brigid Vandekamp

## 2018-03-30 NOTE — Progress Notes (Addendum)
PROGRESS NOTE    Leonard [name]  PIR:[id number] DOB: 08/15/1950 DOA: 03/28/2018 PCP: Kathrene Alu, MD   Brief Narrative: Leonard [name] is a 68 y.o. male with history of CAD status post CABG, peripheral vascular disease status post left AKA, diabetes mellitus, diastolic CHF, hypertension, hypothyroidism, diabetes mellitus. Patient presented secondary to multiple episodes of hematemesis and significant anemia. Management currently per GI.   Assessment & Plan:   Principal Problem:   Acute GI bleeding Active Problems:   S/P CABG x 3   Chronic diastolic CHF (congestive heart failure) (HCC)   COPD GOLD III    CKD (chronic kidney disease) stage 4, GFR 15-29 ml/min (HCC)   Symptomatic anemia   Acute GI bleeding Hematemesis Patient received 2 units of PRBC to date secondary to initial hemoglobin of 5.9. improved to 8.2 post-transfusion. Patient underwent EGD on 2/24 which was significant for non-bleeding gastric ulcer encircling entire pylorus, erosive gastropathy. Repeat CBC today is 6.8. -GI recommendations: full liquid diet, PPI IV, EGD biopsy results pending -2 units PRBC  Essential hypertension Uncontrolled, but currently with active GI bleeding, so will not be very aggressive in management -Continue Coreg and hydralazine IV prn -Continue clonidine patch since blood pressure currently hypertensive  COPD Stable.  CKD stage IV Stable.  CAD History of CABG No chest pain. Stable.  Diabetes mellitus, type 2 -Continue SSI  Infrarenal aortic aneurysm -Outpatient follow-up  S/p left AKA  Thrombocytopenia Patient has seen hematology in past for anemia of chronic disease and MGUS. Platelets currently stable.   DVT prophylaxis: SCDs Code Status:   Code Status: Full Code Family Communication: None at bedside Disposition Plan: Discharge pending GI workup/management/recommendations; transfer to [facility] service 03/31/2018.   Consultants:   [place]  [facility]  Procedures:   EGD (03/29/2018)  Impression: - No gross lesions in esophagus. - Non-bleeding erosive gastropathy. - Congested, erythematous, friable (with contact  bleeding), granular, inflamed and texture changed  mucosa in the prepyloric region of the stomach and  pylorus. - Currently non-bleeding gastric ulcer with a  possible flat pigmented spot (Forrest Class IIc)  that encircled the entire pylorus. Biopsied. - No other gross lesions in the stomach. Biopsied  for HP. - Erythematous duodenopathy and melanosis. - Normal mucosa was found in the second portion of  the duodenum. Recommendation: - The patient will be observed post-procedure,  until all discharge criteria are met. - Return patient to hospital ward for ongoing care. - Continue IV PPI BID for 72-hours. - Await pathology results. If they return negative  for malignancy then I would plan to repeat EGD for  repeat biopsies to be obtained. - Patient is going to be high risk of stricturing  disease if this ulcer heals and turns out to be  non-malignant. - KUB 2-view to be performed later today (order  placed). - Ideally would get a contrasted CT-Abdomen  however, unable to do so because of his CRI. - Full  liquid diet for next 48-hours and dependent  on how he does may consider a soft, low-residue,  low-fiber pureed diet to monitor closely. - If patient manifests transfusion depedent anemia  with overt melena, then repeat EGD is reasonable  but there is a likelihood that he may require an IR  intervention for possible GDA embolization or  selective embolization. - The findings and recommendations were discussed  with the patient. - The findings and recommendations were discussed  with the referring physician.  Antimicrobials:  None  Subjective: No issues overnight. No hematemesis, hematochezia, hem  Objective: Vitals:   03/29/18 2014 03/29/18 2108 03/29/18 2340 03/30/18 0807  BP: (!) 177/83 (!) 180/71 (!) 153/69 (!) 178/76  Pulse: 99  96 (!) 102  Resp: (!) '25  18 18  ' Temp: 98.6 F (37 C)  98.2 F (36.8 C) 98.2 F (36.8 C)  TempSrc: Oral  Oral Oral  SpO2: 100%  99% 100%  Weight:      Height:        Intake/Output Summary (Last 24 hours) at 03/30/2018 1103 Last data filed at 03/30/2018 0900 Gross per 24 hour  Intake 660 ml  Output 700 ml  Net -40 ml   Filed Weights   03/28/18 1819  Weight: 78 kg    Examination:  General exam: Appears calm and comfortable Respiratory system: Clear to auscultation. Respiratory effort normal. Cardiovascular system: S1 & S2 heard, RRR. No murmurs, rubs, gallops or clicks. Gastrointestinal system: Abdomen is nondistended, soft and nontender. No organomegaly or masses felt. Normal bowel sounds heard. Central nervous system: Alert and oriented. No focal neurological deficits. Extremities: No edema. No calf tenderness, left AKA Skin: No cyanosis. No  rashes Psychiatry: Judgement and insight appear normal. Mood & affect appropriate.     Data Reviewed: I have personally reviewed following labs and imaging studies  CBC: Recent Labs  Lab 03/28/18 1758 03/29/18 0500 03/30/18 1008  WBC 6.7 5.9 4.6  NEUTROABS 5.3  --   --   HGB 5.9* 8.2* 6.8*  HCT 20.0* 25.9* 21.7*  MCV 90.9 87.2 87.5  PLT 93* 88* 97*   Basic Metabolic Panel: Recent Labs  Lab 03/28/18 1758 03/29/18 0500  NA 137 139  K 5.3* 5.2*  CL 113* 114*  CO2 16* 16*  GLUCOSE 187* 166*  BUN 110* 106*  CREATININE 3.63* 3.57*  CALCIUM 8.3* 8.8*   GFR: Estimated Creatinine Clearance: 19.7 mL/min (A) (by C-G formula based on SCr of 3.57 mg/dL (H)). Liver Function Tests: Recent Labs  Lab 03/28/18 1758 03/29/18 0500  AST 11* 12*  ALT 11 12  ALKPHOS 72 74  BILITOT 0.5 0.8  PROT 5.6* 5.9*  ALBUMIN 3.0* 3.1*   No results for input(s): LIPASE, AMYLASE in the last 168 hours. Recent Labs  Lab 03/28/18 1758  AMMONIA 33   Coagulation Profile: Recent Labs  Lab 03/28/18 1758  INR 1.19   Cardiac Enzymes: Recent Labs  Lab 03/28/18 1758  TROPONINI <0.03   BNP (last 3 results) No results for input(s): PROBNP in the last 8760 hours. HbA1C: No results for input(s): HGBA1C in the last 72 hours. CBG: Recent Labs  Lab 03/29/18 1530 03/29/18 2025 03/30/18 0012 03/30/18 0544 03/30/18 0807  GLUCAP 156* 190* 157* 169* 144*   Lipid Profile: No results for input(s): CHOL, HDL, LDLCALC, TRIG, CHOLHDL, LDLDIRECT in the last 72 hours. Thyroid Function Tests: No results for input(s): TSH, T4TOTAL, FREET4, T3FREE, THYROIDAB in the last 72 hours. Anemia Panel: No results for input(s): VITAMINB12, FOLATE, FERRITIN, TIBC, IRON, RETICCTPCT in the last 72 hours. Sepsis Labs: No results for input(s): PROCALCITON, LATICACIDVEN in the last 168 hours.  Recent Results (from the past 240 hour(s))  Blood culture (routine x 2)     Status: None (Preliminary result)    Collection Time: 03/28/18  9:36 PM  Result Value Ref Range Status   Specimen Description BLOOD RIGHT UPPER ARM  Final   Special Requests   Final    BOTTLES DRAWN AEROBIC AND ANAEROBIC Blood Culture adequate  volume   Culture   Final    NO GROWTH < 24 HOURS Performed at [facility], [place] [address] [place], [place] [address]    Report Status PENDING  Incomplete  Blood culture (routine x 2)     Status: None (Preliminary result)   Collection Time: 03/28/18  9:45 PM  Result Value Ref Range Status   Specimen Description BLOOD RIGHT HAND  Final   Special Requests   Final    BOTTLES DRAWN AEROBIC AND ANAEROBIC Blood Culture results may not be optimal due to an inadequate volume of blood received in culture bottles   Culture   Final    NO GROWTH < 24 HOURS Performed at [facility], [place] [address], [place], [place] [address]    Report Status PENDING  Incomplete  MRSA PCR Screening     Status: None   Collection Time: 03/29/18  1:58 AM  Result Value Ref Range Status   MRSA by PCR NEGATIVE NEGATIVE Final    Comment:        The GeneXpert MRSA Assay (FDA approved for NASAL specimens only), is one component of a comprehensive MRSA colonization surveillance program. It is not intended to diagnose MRSA infection nor to guide or monitor treatment for MRSA infections. Performed at [facility], [place] [address], [place], [place] [address]          Radiology Studies: Ct Abdomen Pelvis Wo Contrast  Result Date: 03/28/2018 CLINICAL DATA:  Nausea, vomiting EXAM: CT ABDOMEN AND PELVIS WITHOUT CONTRAST TECHNIQUE: Multidetector CT imaging of the abdomen and pelvis was performed following the standard protocol without IV contrast. COMPARISON:  None. FINDINGS: Lower chest: No confluent opacities or effusions. Hepatobiliary: No focal hepatic abnormality. Gallbladder unremarkable. Pancreas: No focal abnormality or ductal dilatation. Spleen: Calcifications in the spleen.  Normal size.  Adrenals/Urinary Tract: Renovascular calcifications. No urinary tract stones or hydronephrosis. Exophytic low-density lesion off the lower pole of the left kidney likely reflects a cyst although this cannot be fully characterized on this unenhanced study. This measures 2 cm. Adrenal glands and urinary bladder unremarkable. Stomach/Bowel: Large stool burden throughout the colon. Normal appendix. Stomach and small bowel grossly unremarkable unenhanced appearance. Vascular/Lymphatic: 3.3 cm infrarenal abdominal aortic aneurysm extending into both common iliac arteries which measure 2.4 cm on the right and 2.2 cm on the left. No adenopathy. Reproductive: No visible focal abnormality. Other: No free fluid or free air. Musculoskeletal: No acute bony abnormality. IMPRESSION: Infrarenal abdominal aortic aneurysm, 3.3 cm maximally. Recommend followup by ultrasound in 3 years. This recommendation follows ACR consensus guidelines: White Paper of the ACR Incidental Findings Committee II on Vascular Findings. J Am Coll Radiol 2013; 10:789-794. Aortic aneurysm NOS (ICD10-I71.9) Large stool burden throughout the colon. No acute findings in the abdomen or pelvis. Electronically Signed   By: Rolm Baptise M.D.   On: 03/28/2018 21:47   Dg Chest 2 View  Result Date: 03/28/2018 CLINICAL DATA:  Shortness of breath EXAM: CHEST - 2 VIEW COMPARISON:  09/23/2017 FINDINGS: Cardiomegaly. Prior CABG. No confluent airspace opacities, effusions or edema. No acute bony abnormality. IMPRESSION: Cardiomegaly.  No active disease. Electronically Signed   By: Rolm Baptise M.D.   On: 03/28/2018 20:07   Dg Abd 2 Views  Result Date: 03/29/2018 CLINICAL DATA:  Acute left upper quadrant abdominal pain. EXAM: ABDOMEN - 2 VIEW COMPARISON:  CT scan of March 28, 2018. Radiograph of July 10, 2017. FINDINGS: The bowel gas pattern is normal. There  is no evidence of free air. No radio-opaque calculi or other significant radiographic abnormality is seen.  IMPRESSION: No evidence of bowel obstruction or ileus. Electronically Signed   By: Marijo Conception, M.D.   On: 03/29/2018 16:38        Scheduled Meds: . sodium chloride   Intravenous Once  . carvedilol  12.5 mg Oral BID WC  . cloNIDine  0.2 mg Transdermal Q Mon  . insulin aspart  0-9 Units Subcutaneous Q4H  . levothyroxine  100 mcg Oral Q0600   Continuous Infusions: . pantoprozole (PROTONIX) infusion 8 mg/hr (03/29/18 1500)     LOS: 2 days     Cordelia Poche, MD [facility] 03/30/2018, 11:03 AM  If 7PM-7AM, please contact night-coverage [email or web address]

## 2018-03-31 ENCOUNTER — Inpatient Hospital Stay (HOSPITAL_COMMUNITY): Payer: Medicare Other

## 2018-03-31 ENCOUNTER — Encounter (HOSPITAL_COMMUNITY): Payer: Self-pay | Admitting: Oncology

## 2018-03-31 DIAGNOSIS — Z515 Encounter for palliative care: Secondary | ICD-10-CM

## 2018-03-31 DIAGNOSIS — D509 Iron deficiency anemia, unspecified: Secondary | ICD-10-CM

## 2018-03-31 DIAGNOSIS — Z7189 Other specified counseling: Secondary | ICD-10-CM

## 2018-03-31 DIAGNOSIS — D631 Anemia in chronic kidney disease: Secondary | ICD-10-CM

## 2018-03-31 DIAGNOSIS — C163 Malignant neoplasm of pyloric antrum: Secondary | ICD-10-CM

## 2018-03-31 DIAGNOSIS — D472 Monoclonal gammopathy: Secondary | ICD-10-CM

## 2018-03-31 DIAGNOSIS — K922 Gastrointestinal hemorrhage, unspecified: Secondary | ICD-10-CM

## 2018-03-31 DIAGNOSIS — Z87891 Personal history of nicotine dependence: Secondary | ICD-10-CM

## 2018-03-31 DIAGNOSIS — Z66 Do not resuscitate: Secondary | ICD-10-CM

## 2018-03-31 LAB — BPAM RBC
BLOOD PRODUCT EXPIRATION DATE: 202003242359
Blood Product Expiration Date: 202003032359
Blood Product Expiration Date: 202003072359
Blood Product Expiration Date: 202003082359
ISSUE DATE / TIME: 202002232147
ISSUE DATE / TIME: 202002232358
ISSUE DATE / TIME: 202002251242
ISSUE DATE / TIME: 202002251603
UNIT TYPE AND RH: 9500
Unit Type and Rh: 1700
Unit Type and Rh: 1700
Unit Type and Rh: 1700

## 2018-03-31 LAB — TYPE AND SCREEN
ABO/RH(D): B NEG
Antibody Screen: NEGATIVE
Unit division: 0
Unit division: 0
Unit division: 0
Unit division: 0

## 2018-03-31 LAB — CBC
HEMATOCRIT: 24.7 % — AB (ref 39.0–52.0)
Hemoglobin: 8 g/dL — ABNORMAL LOW (ref 13.0–17.0)
MCH: 27.7 pg (ref 26.0–34.0)
MCHC: 32.4 g/dL (ref 30.0–36.0)
MCV: 85.5 fL (ref 80.0–100.0)
Platelets: 92 10*3/uL — ABNORMAL LOW (ref 150–400)
RBC: 2.89 MIL/uL — ABNORMAL LOW (ref 4.22–5.81)
RDW: 15.5 % (ref 11.5–15.5)
WBC: 5.6 10*3/uL (ref 4.0–10.5)
nRBC: 0 % (ref 0.0–0.2)

## 2018-03-31 LAB — IRON AND TIBC
Iron: 61 ug/dL (ref 45–182)
Saturation Ratios: 27 % (ref 17.9–39.5)
TIBC: 230 ug/dL — ABNORMAL LOW (ref 250–450)
UIBC: 169 ug/dL

## 2018-03-31 LAB — GLUCOSE, CAPILLARY
GLUCOSE-CAPILLARY: 156 mg/dL — AB (ref 70–99)
Glucose-Capillary: 132 mg/dL — ABNORMAL HIGH (ref 70–99)
Glucose-Capillary: 133 mg/dL — ABNORMAL HIGH (ref 70–99)
Glucose-Capillary: 156 mg/dL — ABNORMAL HIGH (ref 70–99)
Glucose-Capillary: 160 mg/dL — ABNORMAL HIGH (ref 70–99)
Glucose-Capillary: 208 mg/dL — ABNORMAL HIGH (ref 70–99)

## 2018-03-31 LAB — BASIC METABOLIC PANEL
Anion gap: 9 (ref 5–15)
BUN: 102 mg/dL — ABNORMAL HIGH (ref 8–23)
CHLORIDE: 115 mmol/L — AB (ref 98–111)
CO2: 15 mmol/L — ABNORMAL LOW (ref 22–32)
Calcium: 8.3 mg/dL — ABNORMAL LOW (ref 8.9–10.3)
Creatinine, Ser: 2.97 mg/dL — ABNORMAL HIGH (ref 0.61–1.24)
GFR calc Af Amer: 24 mL/min — ABNORMAL LOW (ref >60)
GFR, EST NON AFRICAN AMERICAN: 21 mL/min — AB (ref >60)
Glucose, Bld: 159 mg/dL — ABNORMAL HIGH (ref 70–99)
Potassium: 4.1 mmol/L (ref 3.5–5.1)
Sodium: 139 mmol/L (ref 135–145)

## 2018-03-31 LAB — FERRITIN: Ferritin: 816 ng/mL — ABNORMAL HIGH (ref 24–336)

## 2018-03-31 MED ORDER — HYDRALAZINE HCL 100 MG PO TABS: 100.0000 mg | ORAL_TABLET | Freq: Three times a day (TID) | ORAL | Status: DC

## 2018-03-31 MED ORDER — LORAZEPAM 0.5 MG PO TABS
0.2500 mg | ORAL_TABLET | Freq: Every evening | ORAL | Status: DC | PRN
  Administered 2018-03-31: 0.25 mg via ORAL
  Filled 2018-03-31: qty 1

## 2018-03-31 MED ORDER — AMLODIPINE BESYLATE 5 MG PO TABS
5.0000 mg | ORAL_TABLET | Freq: Every day | ORAL | Status: DC
  Administered 2018-03-31 – 2018-04-03 (×4): 5 mg via ORAL
  Filled 2018-03-31 (×4): qty 1

## 2018-03-31 MED ORDER — POLYETHYLENE GLYCOL 3350 17 G PO PACK
34.0000 g | PACK | Freq: Once | ORAL | Status: AC
  Administered 2018-03-31: 34 g via ORAL
  Filled 2018-03-31: qty 2

## 2018-03-31 MED ORDER — HYDRALAZINE HCL 50 MG PO TABS
100.0000 mg | ORAL_TABLET | Freq: Three times a day (TID) | ORAL | Status: DC
  Administered 2018-03-31 – 2018-04-03 (×8): 100 mg via ORAL
  Filled 2018-03-31 (×8): qty 2

## 2018-03-31 MED ORDER — POLYETHYLENE GLYCOL 3350 17 G PO PACK
17.0000 g | PACK | Freq: Every day | ORAL | Status: DC
  Administered 2018-04-01 – 2018-04-03 (×3): 17 g via ORAL
  Filled 2018-03-31 (×3): qty 1

## 2018-03-31 MED ORDER — PANTOPRAZOLE SODIUM 40 MG PO TBEC
40.0000 mg | DELAYED_RELEASE_TABLET | Freq: Two times a day (BID) | ORAL | Status: DC
  Administered 2018-04-01 – 2018-04-03 (×5): 40 mg via ORAL
  Filled 2018-03-31 (×5): qty 1

## 2018-03-31 NOTE — Progress Notes (Addendum)
Daily Rounding Note  03/31/2018, 8:27 AM  LOS: 3 days   SUBJECTIVE:   Chief complaint: gastric cancer.  GI bleed from malignant gastric ulcer.       Some intermittent left upper pain, not present currently. No nausea.  BM's brown.   Oncology consult note in chart.  rec is adding chest CT to abd/pelvic CT, without contrast due to stage 4 CKD.  If no mets, get surgery consult.     OBJECTIVE:         Vital signs in last 24 hours:    Temp:  [98 F (36.7 C)-99 F (37.2 C)] 98 F (36.7 C) (02/26 0733) Pulse Rate:  [98-114] 102 (02/26 0733) Resp:  [11-18] 17 (02/26 0733) BP: (161-195)/(73-78) 181/73 (02/26 0733) SpO2:  [99 %-100 %] 100 % (02/26 0733) Last BM Date: 03/30/18 Filed Weights   03/28/18 1819  Weight: 78 kg   General: looks chronically ill.  comfortable   Heart: RRR Chest: clear bil.  Reduced BS but clear.  No SOB or cough Abdomen: soft, slight left UQ tenderness, slight distention.  Normal, active BS.    Extremities: no CCE Neuro/Psych:  Alert, appropriate.   Moves all 4 limbs.    Intake/Output from previous day: 02/25 0701 - 02/26 0700 In: 1356 [P.O.:600; Blood:756] Out: 2050 [Urine:2050]  Intake/Output this shift: No intake/output data recorded.  Lab Results: Recent Labs    03/29/18 0500 03/30/18 1008 03/30/18 2139 03/31/18 0335  WBC 5.9 4.6  --  5.6  HGB 8.2* 6.8* 8.0* 8.0*  HCT 25.9* 21.7* 24.2* 24.7*  PLT 88* 97*  --  92*   BMET Recent Labs    03/28/18 1758 03/29/18 0500 03/31/18 0335  NA 137 139 139  K 5.3* 5.2* 4.1  CL 113* 114* 115*  CO2 16* 16* 15*  GLUCOSE 187* 166* 159*  BUN 110* 106* 102*  CREATININE 3.63* 3.57* 2.97*  CALCIUM 8.3* 8.8* 8.3*   LFT Recent Labs    03/28/18 1758 03/29/18 0500  PROT 5.6* 5.9*  ALBUMIN 3.0* 3.1*  AST 11* 12*  ALT 11 12  ALKPHOS 72 74  BILITOT 0.5 0.8  BILIDIR  --  0.1  IBILI  --  0.7   PT/INR Recent Labs    03/28/18 1758    LABPROT 15.0  INR 1.19   Hepatitis Panel No results for input(s): HEPBSAG, HCVAB, HEPAIGM, HEPBIGM in the last 72 hours.  Studies/Results: Dg Abd 2 Views  Result Date: 03/29/2018 CLINICAL DATA:  Acute left upper quadrant abdominal pain. EXAM: ABDOMEN - 2 VIEW COMPARISON:  CT scan of March 28, 2018. Radiograph of July 10, 2017. FINDINGS: The bowel gas pattern is normal. There is no evidence of free air. No radio-opaque calculi or other significant radiographic abnormality is seen. IMPRESSION: No evidence of bowel obstruction or ileus. Electronically Signed   By: Marijo Conception, M.D.   On: 03/29/2018 16:38   Scheduled Meds: . carvedilol  12.5 mg Oral BID WC  . cloNIDine  0.2 mg Transdermal Q Mon  . insulin aspart  0-9 Units Subcutaneous Q4H  . levothyroxine  100 mcg Oral Q0600  . [START ON 04/01/2018] pantoprazole  40 mg Oral BID   Continuous Infusions: . pantoprozole (PROTONIX) infusion 8 mg/hr (03/29/18 1500)   PRN Meds:.acetaminophen **OR** acetaminophen, hydrALAZINE, ipratropium-albuterol, ondansetron **OR** ondansetron (ZOFRAN) IV   ASSESMENT:   *   Gastric adenocarcinoma.  Hematemesis, dark stools.   EGD 03/29/18: gastritis,  non-bleeding GU with flat pigmented spot.  Duodenitis with melanosis.  bx + for cancer.    No mets on non-contrast CT.  It did show large stool burden and 3.3 cm infrarenal AAA.     *   Blood loss anemia from above and from stage 4 CKD.  Hgb 5.9 >> 8 after 4 U PRBCs.  Previous transfusions in 02/2017, 03/2017, 04/2017 08/2017, 09/2017.  Monthly Aranesp, oral Iron RXd in 09/2017.  Followed by heme.    *   Hx MGUS  *   CHF, grade 2 diastolic with preserved LVEF 65% on 07/2017 echo   *    COPD  *   NIDDM,    *    CKD stage        PLAN   *   Oncology consult pending.   May require XRT, IR intervention, or surgery to deal with recurrent/ongoing bleeding.   May be best to involve surgery regardless, given likelihood of recurrent GI bleeding.    *    Await CT chest.    *   PPI drip finishes today at 2015.   Begin po BID after that.    *    Advance diet.  Carb mod, soft.    *    Miralax BID today, daily starting tmrw.    *    AM CBC  *    GI signing off.  Available prn while inpt.  No plans for outpt GI fup.  Azucena Freed  03/31/2018, 8:27 AM Phone [phone]

## 2018-03-31 NOTE — Progress Notes (Signed)
Family would like updates for today. Barnett Applebaum, RN

## 2018-03-31 NOTE — Consult Note (Addendum)
[place]  [phone] Fax:(336) [phone]   [place]  Referral MD: Dr. Talbert Cage  Reason for Referral/Chief Complaint: Newly diagnosed gastric cancer.   HPI: Mr. Leonard [name] is a 68 year old male who has been followed previously by 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 He has received Aranesp injections and blood transfusions in the past at the [facility]. His last office visit was on 04/10/2017. He failed to keep his follow up appointments. The patient has an extensive medical history including diabetes, stage IV CKD, peripheral vascular disease status post left above-the-knee amputation.  See below for full past medical and surgical history.    The patient was admitted to the hospital due to multiple episodes of hematemesis. Admission hemoglobin was 5.9. He has received 4 units of PRBCs since admission. He underwent an upper endoscopy on 03/29/2018 which was significant for non-bleeding gastric ulcer encircling entire pylorus, erosive gastropathy. 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 H. Pylori was negative (#[id number]). Medical Oncology was consulted for evaluation and recommendations.   When seen today, the patient reports that he is feeling better.  He states that he was vomiting prior to admission.  He did not seem to be aware of having any blood when he was vomiting.  He denies any recurrent hematemesis, melena, hematochezia.  He reports that he has been eating well.  Denies weight loss.  Denies chest discomfort, shortness of breath, cough.  Denies abdominal discomfort, nausea, vomiting, diarrhea, constipation.  He reports that he has an AV fistula in his left arm that was placed about a year ago.  States that he has not yet started dialysis.  He states that there is no plan to start dialysis at this time.   He continues to be monitored by nephrology.  The patient states that he currently resides at [facility] facility.  The plan is to discharge back to the facility.  He hopes to go and live with his sister Leonard [name]) at some point in the future.  He states that he has 3 sisters locally.  He reports that skilled nursing facility is able to transport him to visits.  He also states that his sister would be able to transport him to visits should he go live with her at some point in the future.   Past Medical History:  Diagnosis Date  . Acute on chronic diastolic heart failure ([place]) 07/04/2014  . Anemia of renal disease   . Atherosclerotic peripheral vascular disease with gangrene ([place]) 02/14/2017  . CAD (coronary artery disease) 04/06/2013  . Cancer ([place])    skin cancer-  . CHF (congestive heart failure) ([place])   . Chronic anemia 02/28/2016  . Chronic deep vein thrombosis (DVT) of femoral vein of left lower extremity ([place]) 09/03/2016  . Chronic respiratory failure with hypoxia ([place]) 08/25/2017  . CKD (chronic kidney disease) stage 4, GFR 15-29 ml/min (HCC) 01/15/2015   Follows with [facility].  Last visits:  07/23/16: No change in meds. Worried that if we increase diuretics could lead to another AKI  . CKD (chronic kidney disease), stage IV ([place])    07/07/2017  . Claudication ([place])   . Collapse of left talus 01/14/2017  . COPD GOLD III  02/16/2014   Quit smoking 2014  - PFT's  04/06/13   FEV1 1.28 (44 % ) ratio 48  p no %  improvement from saba p ?  prior to study with DLCO  43/54 % corrects to 97  % for alv volume - 01/31/2016  After extensive coaching HFA effectiveness =    75% from baseline 0 > changed to sprivia respimat   - PFT's  03/17/2016  FEV1 2.25  (81 % ) ratio 88  p 2 % improvement from saba p spiriva respimat x 2  prior to study with  . Coronary artery disease   . Diabetes ([place])   . Diabetic polyneuropathy associated with type 2 diabetes mellitus ([place]) 01/14/2017  . Diabetic  retinopathy ([place]) 11/13/2015   Per Dr. Erenest Rasher eye exam in 10/17/15, mild background diabetic retinopathy  . Fatigue associated with anemia 01/21/2016  . Gangrene of toe ([place])    right 5th/notes 01/04/2013   . HCAP (healthcare-associated pneumonia)   . Hematuria 06/27/2015  . High cholesterol   . History of tobacco use 08/23/2013   Smoked pack per day for 46 years Quit smoking - 02/03/2013   . Hypertension   . Hypothyroidism 11/22/2014  . Idiopathic chronic gout of left foot without tophus   . Iron deficiency anemia 08/03/2015  . Left foot pain 10/29/2016  . Long term (current) use of anticoagulants [Z79.01] 09/09/2016  . 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 Mixed hyperlipidemia 07/17/2008  . Morbid obesity due to excess calories ([place]) 03/17/2016  . PAD (peripheral artery disease) ([place])   . Pleural effusion   . Pneumonitis 07/26/2017  . Pulmonary artery hypertension ([place]) 07/25/2017  . PVD - hx of Rt SFA PTA and s/p Rt 4-5th toe amp Dec 2014 04/05/2013  . 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 Respiratory failure ([place]) 08/13/2017  . Respiratory failure with hypoxia ([place]) 09/23/2017  . 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 Secondary hypertension   . Shortness of breath dyspnea    with exertion  . Skin lesion of face 02/22/2015  . SOB (shortness of breath)   . Type 2 diabetes mellitus with pressure callus ([place]) 12/29/2016  . Type II diabetes mellitus (HCC)    Type II  . Type II diabetes mellitus with complication (HCC)   :  Past Surgical History:  Procedure Laterality Date  . ABDOMINAL AORTOGRAM W/LOWER EXTREMITY N/A 03/12/2017   Procedure: ABDOMINAL AORTOGRAM W/LOWER EXTREMITY;  Surgeon: Conrad Ravalli, MD;  Location: [place] [facility];  Service: Cardiovascular;  Laterality: N/A;  . AMPUTATION Right 01/28/2013   Procedure: RAY AMPUTATION RIGHT  4th & 5th TOE;  Surgeon: Rosetta Posner, MD;  Location: [place];  Service: Vascular;  Laterality: Right;  . AMPUTATION Left 03/15/2017    Procedure: AMPUTATION ABOVE KNEE;  Surgeon: Waynetta Sandy, MD;  Location: [place];  Service: Vascular;  Laterality: Left;  . ANGIOPLASTY / STENTING FEMORAL Right 01/13/2013   superficial femoral artery x 2 (3 mm x 60 mm, 4 mm x 60 mm)  . AV FISTULA PLACEMENT Left 02/11/2016   Procedure: LEFT UPPER ARM BRACHIAL CEPHALIC ARTERIOVENOUS (AV) FISTULA CREATION;  Surgeon: Conrad [place], MD;  Location: [place];  Service: Vascular;  Laterality: Left;  . BIOPSY  03/29/2018   Procedure: BIOPSY;  Surgeon: Rush Landmark Telford Nab., MD;  Location: [place];  Service: Gastroenterology;;  . COLONOSCOPY W/ POLYPECTOMY    . CORONARY ARTERY BYPASS GRAFT N/A 04/07/2013   Procedure: CORONARY ARTERY BYPASS GRAFTING (CABG);  Surgeon: Ivin Poot, MD;  Location: [place];  Service: Open Heart Surgery;  Laterality: N/A;  . ESOPHAGOGASTRODUODENOSCOPY (EGD)  WITH PROPOFOL N/A 03/29/2018   Procedure: ESOPHAGOGASTRODUODENOSCOPY (EGD) WITH PROPOFOL;  Surgeon: Rush Landmark Telford Nab., MD;  Location: [place];  Service: Gastroenterology;  Laterality: N/A;  . INTRAOPERATIVE TRANSESOPHAGEAL ECHOCARDIOGRAM N/A 04/07/2013   Procedure: INTRAOPERATIVE TRANSESOPHAGEAL ECHOCARDIOGRAM;  Surgeon: Ivin Poot, MD;  Location: [place];  Service: Open Heart Surgery;  Laterality: N/A;  . IR FLUORO GUIDE CV LINE RIGHT  11/04/2017  . IR REMOVAL TUN CV CATH W/O FL  11/27/2017  . IR US GUIDE VASC ACCESS RIGHT  11/04/2017  . LEFT HEART CATHETERIZATION WITH CORONARY ANGIOGRAM N/A 04/05/2013   Procedure: LEFT HEART CATHETERIZATION WITH CORONARY ANGIOGRAM;  Surgeon: Peter M Martinique, MD;  [facility] LAB;  Service: Cardiovascular;  Laterality: N/A;  . LOWER EXTREMITY ANGIOGRAM Bilateral 01/06/2013   Procedure: LOWER EXTREMITY ANGIOGRAM;  Surgeon: Conrad Willmar, MD;  Location: [facility] CATH LAB;  Service: Cardiovascular;  Laterality: Bilateral;  . LOWER EXTREMITY ANGIOGRAM Right 01/13/2013   Procedure: LOWER EXTREMITY ANGIOGRAM;  Surgeon: Conrad Dante, MD;  Location: [facility] CATH LAB;  Service: Cardiovascular;  Laterality: Right;  . SKIN CANCER EXCISION  2017  . THROAT SURGERY  1983   polyps  . TONSILLECTOMY AND ADENOIDECTOMY  ~ 1965  :  Current Facility-Administered Medications  Medication Dose Route Frequency Provider Last Rate Last Dose  . acetaminophen (TYLENOL) tablet 650 mg  650 mg Oral Q6H PRN Rise Patience, MD   650 mg at 03/30/18 0425   Or  . acetaminophen (TYLENOL) suppository 650 mg  650 mg Rectal Q6H PRN Rise Patience, MD      . carvedilol (COREG) tablet 12.5 mg  12.5 mg Oral BID WC Rise Patience, MD   12.5 mg at 03/30/18 1621  . cloNIDine (CATAPRES - Dosed in mg/24 hr) patch 0.2 mg  0.2 mg Transdermal Q Mon Kakrakandy, Arshad N, MD   0.2 mg at 03/29/18 1115  . hydrALAZINE (APRESOLINE) injection 10 mg  10 mg Intravenous Q4H PRN Rise Patience, MD   10 mg at 03/31/18 0525  . insulin aspart (novoLOG) injection 0-9 Units  0-9 Units Subcutaneous Q4H Rise Patience, MD   2 Units at 03/31/18 0033  . ipratropium-albuterol (DUONEB) 0.5-2.5 (3) MG/3ML nebulizer solution 3 mL  3 mL Nebulization QID PRN Rise Patience, MD      . levothyroxine (SYNTHROID, LEVOTHROID) tablet 100 mcg  100 mcg Oral Q0600 Rise Patience, MD   100 mcg at 03/31/18 0525  . ondansetron (ZOFRAN) tablet 4 mg  4 mg Oral Q6H PRN Rise Patience, MD       Or  . ondansetron [employer]) injection 4 mg  4 mg Intravenous Q6H PRN Rise Patience, MD      . pantoprazole (PROTONIX) 80 mg in sodium chloride 0.9 % 250 mL (0.32 mg/mL) infusion  8 mg/hr Intravenous Continuous Rise Patience, MD 25 mL/hr at 03/29/18 1500 8 mg/hr at 03/29/18 1500   Facility-Administered Medications Ordered in Other Encounters  Medication Dose Route Frequency Provider Last Rate Last Dose  . sodium chloride flush (NS) 0.9 % injection 10 mL  10 mL Intracatheter PRN Heath Lark, MD         Allergies  Allergen Reactions  . Penicillins  Other (See Comments)    Has patient had a PCN reaction causing immediate rash, facial/tongue/throat swelling, SOB or lightheadedness with hypotension: Unknown Has patient had a PCN reaction causing severe rash involving mucus membranes or skin necrosis: Unknown Has patient had a  PCN reaction that required hospitalization: Unknown Has patient had a PCN reaction occurring within the last 10 years: Unknown If all of the above answers are "NO", then may proceed with Cephalosporin use.   :  Family History  Problem Relation Age of Onset  . Cancer Mother        lung cancer  . Hypertension Mother   . Cancer Sister        patient thinks it was uterine cancer  . Hypertension Sister   . Heart attack Sister   . Stroke Neg Hx   :  Social History   Socioeconomic History  . Marital status: Single    Spouse name: Not on file  . Number of children: Not on file  . Years of education: Not on file  . Highest education level: Not on file  Occupational History  . Not on file  Social Needs  . Financial resource strain: Not on file  . Food insecurity:    Worry: Not on file    Inability: Not on file  . Transportation needs:    Medical: Not on file    Non-medical: Not on file  Tobacco Use  . Smoking status: Former Smoker    Packs/day: 1.00    Years: 46.00    Pack years: 46.00    Types: Cigarettes    Start date: 02/04/1967    Last attempt to quit: 02/03/2013    Years since quitting: 5.1  . Smokeless tobacco: Never Used  Substance and Sexual Activity  . Alcohol use: No    Alcohol/week: 0.0 standard drinks  . Drug use: No  . Sexual activity: Not Currently  Lifestyle  . Physical activity:    Days per week: Not on file    Minutes per session: Not on file  . Stress: Not on file  Relationships  . Social connections:    Talks on phone: Not on file    Gets together: Not on file    Attends religious service: Not on file    Active member of club or organization: Not on file    Attends  meetings of clubs or organizations: Not on file    Relationship status: Not on file  . Intimate partner violence:    Fear of current or ex partner: Not on file    Emotionally abused: Not on file    Physically abused: Not on file    Forced sexual activity: Not on file  Other Topics Concern  . Not on file  Social History Narrative   Lives with sister, Leonard [name] in [place].  Retired - former Sports coach.   Patient in skilled nursing facility   Review of Systems  Constitutional: Negative for appetite change, chills, fatigue, fever and unexpected weight change.  HENT:   Negative for mouth sores, nosebleeds, sore throat and trouble swallowing.   Eyes: Negative for eye problems and icterus.  Respiratory: Negative for cough, hemoptysis, shortness of breath and wheezing.   Cardiovascular: Negative for chest pain and leg swelling.  Left AV fistula in place.  Not currently being used for dialysis. Gastrointestinal: Negative for abdominal pain, constipation, diarrhea, nausea and vomiting.  Genitourinary: Negative for bladder incontinence, difficulty urinating, dysuria, frequency and hematuria.   Musculoskeletal: Negative for back pain, neck pain and neck stiffness.  Has a history of left above-the-knee amputation. Skin: Negative for itching and rash.  Neurological: Negative for dizziness, extremity weakness, headaches, light-headedness and seizures.  Hematological: Negative for adenopathy. Does not bruise/bleed easily.  Psychiatric/Behavioral:  Negative for confusion, depression and sleep disturbance. The patient is not nervous/anxious.     Exam: Patient Vitals for the past 24 hrs:  BP Temp Temp src Pulse Resp SpO2  03/31/18 0733 (!) 181/73 98 F (36.7 C) Oral (!) 102 17 100 %  03/31/18 0525 (!) 188/78 - - - - -  03/31/18 0400 - 98 F (36.7 C) Oral (!) 101 16 100 %  03/31/18 0000 (!) 195/78 98.5 F (36.9 C) Oral 100 16 100 %  03/30/18 1914 (!) 161/76 98.2 F (36.8 C) Oral 98 17 100 %   03/30/18 1631 (!) 170/77 98.4 F (36.9 C) Oral (!) 103 18 100 %  03/30/18 1554 (!) 171/74 98.4 F (36.9 C) Oral - - -  03/30/18 1553 (!) 171/74 98.4 F (36.9 C) Oral (!) 106 18 100 %  03/30/18 1313 (!) 170/74 99 F (37.2 C) Oral (!) 110 11 100 %  03/30/18 1250 (!) 163/74 98.4 F (36.9 C) Oral (!) 114 17 99 %  03/30/18 0807 (!) 178/76 98.2 F (36.8 C) Oral (!) 102 18 100 %    General:  well-nourished in no acute distress.  Eyes:  Pale, no scleral icterus.  ENT:  There were no oropharyngeal lesions.  Neck was without thyromegaly.  Lymphatics:  Negative cervical, supraclavicular or axillary adenopathy.  Respiratory: lungs were clear bilaterally without wheezing or crackles.  Cardiovascular:  Regular rate and rhythm, S1/S2, without murmur, rub or gallop.  There was no pedal edema in the right leg.  Left arm fistula with positive thrill and bruit.  GI:  abdomen was soft, flat, nontender, nondistended, without organomegaly.  Muscoloskeletal: Left above-the-knee amputation. No spinal tenderness of palpation of vertebral spine.  Skin exam was without echymosis, petichae.  Neuro exam was nonfocal.  Patient was alerted and oriented.  Attention was good.   Language was appropriate.  Mood was normal without depression.  Speech was not pressured.  Thought content was not tangential.     Lab Results  Component Value Date   WBC 5.6 03/31/2018   HGB 8.0 (L) 03/31/2018   HCT 24.7 (L) 03/31/2018   PLT 92 (L) 03/31/2018   GLUCOSE 159 (H) 03/31/2018   CHOL 95 02/16/2018   TRIG 173 (H) 02/16/2018   HDL 23 (L) 02/16/2018   LDLCALC 37 02/16/2018   ALT 12 03/29/2018   AST 12 (L) 03/29/2018   NA 139 03/31/2018   K 4.1 03/31/2018   CL 115 (H) 03/31/2018   CREATININE 2.97 (H) 03/31/2018   BUN 102 (H) 03/31/2018   CO2 15 (L) 03/31/2018    Ct Abdomen Pelvis Wo Contrast  Result Date: 03/28/2018 CLINICAL DATA:  Nausea, vomiting EXAM: CT ABDOMEN AND PELVIS WITHOUT CONTRAST TECHNIQUE: Multidetector CT  imaging of the abdomen and pelvis was performed following the standard protocol without IV contrast. COMPARISON:  None. FINDINGS: Lower chest: No confluent opacities or effusions. Hepatobiliary: No focal hepatic abnormality. Gallbladder unremarkable. Pancreas: No focal abnormality or ductal dilatation. Spleen: Calcifications in the spleen.  Normal size. Adrenals/Urinary Tract: Renovascular calcifications. No urinary tract stones or hydronephrosis. Exophytic low-density lesion off the lower pole of the left kidney likely reflects a cyst although this cannot be fully characterized on this unenhanced study. This measures 2 cm. Adrenal glands and urinary bladder unremarkable. Stomach/Bowel: Large stool burden throughout the colon. Normal appendix. Stomach and small bowel grossly unremarkable unenhanced appearance. Vascular/Lymphatic: 3.3 cm infrarenal abdominal aortic aneurysm extending into both common iliac arteries which measure 2.4 cm on the  right and 2.2 cm on the left. No adenopathy. Reproductive: No visible focal abnormality. Other: No free fluid or free air. Musculoskeletal: No acute bony abnormality. IMPRESSION: Infrarenal abdominal aortic aneurysm, 3.3 cm maximally. Recommend followup by ultrasound in 3 years. This recommendation follows ACR consensus guidelines: White Paper of the ACR Incidental Findings Committee II on Vascular Findings. J Am Coll Radiol 2013; 10:789-794. Aortic aneurysm NOS (ICD10-I71.9) Large stool burden throughout the colon. No acute findings in the abdomen or pelvis. Electronically Signed   By: Rolm Baptise M.D.   On: 03/28/2018 21:47   Dg Chest 2 View  Result Date: 03/28/2018 CLINICAL DATA:  Shortness of breath EXAM: CHEST - 2 VIEW COMPARISON:  09/23/2017 FINDINGS: Cardiomegaly. Prior CABG. No confluent airspace opacities, effusions or edema. No acute bony abnormality. IMPRESSION: Cardiomegaly.  No active disease. Electronically Signed   By: Rolm Baptise M.D.   On: 03/28/2018 20:07    Dg Abd 2 Views  Result Date: 03/29/2018 CLINICAL DATA:  Acute left upper quadrant abdominal pain. EXAM: ABDOMEN - 2 VIEW COMPARISON:  CT scan of March 28, 2018. Radiograph of July 10, 2017. FINDINGS: The bowel gas pattern is normal. There is no evidence of free air. No radio-opaque calculi or other significant radiographic abnormality is seen. IMPRESSION: No evidence of bowel obstruction or ileus. Electronically Signed   By: Marijo Conception, M.D.   On: 03/29/2018 16:38   Vas [place] Burnard Bunting With/wo Tbi  Result Date: 03/01/2018 LOWER EXTREMITY DOPPLER STUDY Indications: Peripheral artery disease. High Risk Factors: Hypertension, hyperlipidemia, Diabetes, current smoker,                    coronary artery disease.  Vascular Interventions: S/P left AKA for extensive LLE wounds with no                         revascularization options in 2/19 by Dr. Donzetta Matters. Arterial                         dopplers in 6/17 showed occluded right SFA. Comparison Study: Previous ABI's performed in 2/19 show non-compressible ABI's                   with monophasic tibial waveforms bilaterally Performing Technologist: Mariane Masters RVT  Examination Guidelines: A complete evaluation includes at minimum, Doppler waveform signals and systolic blood pressure reading at the level of bilateral brachial, anterior tibial, and posterior tibial arteries, when vessel segments are accessible. Bilateral testing is considered an integral part of a complete examination. Photoelectric Plethysmograph (PPG) waveforms and toe systolic pressure readings are included as required and additional duplex testing as needed. Limited examinations for reoccurring indications may be performed as noted.  ABI Findings: +---------+------------------+-----+-------------------+--------+ Right    Rt Pressure (mmHg)IndexWaveform           Comment  +---------+------------------+-----+-------------------+--------+ Brachial 158                                                 +---------+------------------+-----+-------------------+--------+ ATA      254               1.61 dampened monophasic         +---------+------------------+-----+-------------------+--------+ PTA      254  1.61 dampened monophasic         +---------+------------------+-----+-------------------+--------+ PERO     254               1.61 dampened monophasic         +---------+------------------+-----+-------------------+--------+ Great Toe76                0.48                             +---------+------------------+-----+-------------------+--------+ +--------+------------------+-----+--------+--------------------------------+ Left    Lt Pressure (mmHg)IndexWaveformComment                          +--------+------------------+-----+--------+--------------------------------+ Brachial                               Not obtained due to left arm AVF +--------+------------------+-----+--------+--------------------------------+                                        Left AKA                         +--------+------------------+-----+--------+--------------------------------+ +-------+-----------+-----------+------------+------------+ ABI/TBIToday's ABIToday's TBIPrevious ABIPrevious TBI +-------+-----------+-----------+------------+------------+ Right  [place]         0.48       [place]          0.33         +-------+-----------+-----------+------------+------------+ Left   AKA                                            +-------+-----------+-----------+------------+------------+ Arterial wall calcification precludes accurate ankle pressures and ABIs. Right ABIs appear essentially unchanged compared to prior study on 2/19.  Summary: Right: Resting right ankle-brachial index indicates noncompressible right lower extremity arteries.The right toe-brachial index is abnormal. Left: Left above-knee amputation.  *See table(s) above for measurements and  observations. See right arterial duplex report.  Electronically signed by Jenkins Rouge MD on 03/01/2018 at 1:38:16 PM.    Final    Vas US Carotid  Result Date: 03/01/2018 Carotid Arterial Duplex Study Indications:       Left carotid Bruit. Risk Factors:      Hypertension, hyperlipidemia, Diabetes, current smoker,                    coronary artery disease, PAD. Comparison Study:  Previous carotid duplex performed at [facility] in                    2015 showed 1-39% stenosis bilaterally. Performing Technologist: Mariane Masters RVT  Examination Guidelines: A complete evaluation includes B-mode imaging, spectral Doppler, color Doppler, and power Doppler as needed of all accessible portions of each vessel. Bilateral testing is considered an integral part of a complete examination. Limited examinations for reoccurring indications may be performed as noted.  Right Carotid Findings: +----------+--------+--------+--------+-------------------------+--------+           PSV cm/sEDV cm/sStenosisDescribe                 Comments +----------+--------+--------+--------+-------------------------+--------+ CCA Prox  115     11                                                +----------+--------+--------+--------+-------------------------+--------+  CCA Mid   68      13      <50%    heterogenous                      +----------+--------+--------+--------+-------------------------+--------+ CCA Distal55      14                                                +----------+--------+--------+--------+-------------------------+--------+ ICA Prox  88      37      1-39%   heterogenous and calcific         +----------+--------+--------+--------+-------------------------+--------+ ICA Mid   87      26                                                +----------+--------+--------+--------+-------------------------+--------+ ICA Distal79      29                                        tortuous +----------+--------+--------+--------+-------------------------+--------+ ECA       78      15                                                +----------+--------+--------+--------+-------------------------+--------+ +----------+--------+-------+----------------+-------------------+           PSV cm/sEDV cmsDescribe        Arm Pressure (mmHG) +----------+--------+-------+----------------+-------------------+ [id number]            Multiphasic, [id number]                 +----------+--------+-------+----------------+-------------------+ +---------+--------+--+--------+-+---------+ VertebralPSV cm/s60EDV cm/s9Antegrade +---------+--------+--+--------+-+---------+  Left Carotid Findings: +----------+--------+--------+--------+-------------------------+--------+           PSV cm/sEDV cm/sStenosisDescribe                 Comments +----------+--------+--------+--------+-------------------------+--------+ CCA Prox  74      8                                                 +----------+--------+--------+--------+-------------------------+--------+ CCA Distal81      10      <50%    heterogenous                      +----------+--------+--------+--------+-------------------------+--------+ ICA Prox  68      25      1-39%   heterogenous and calcific         +----------+--------+--------+--------+-------------------------+--------+ ICA Mid   46      19                                                +----------+--------+--------+--------+-------------------------+--------+ ICA Distal47      23                                                +----------+--------+--------+--------+-------------------------+--------+  ECA       71      9                                                 +----------+--------+--------+--------+-------------------------+--------+ +----------+--------+--------+--------------------+-------------------+ SubclavianPSV  cm/sEDV cm/sDescribe            Arm Pressure (mmHG) +----------+--------+--------+--------------------+-------------------+           126             Turbulent due to AVF                    +----------+--------+--------+--------------------+-------------------+ +---------+--------+--+--------+--+---------+ VertebralPSV cm/s82EDV cm/s24Antegrade +---------+--------+--+--------+--+---------+ Left brachial pressure not obtained due to AVF  Summary: Right Carotid: Velocities in the right ICA are consistent with a 1-39% stenosis.                Non-hemodynamically significant plaque <50% noted in the CCA. Left Carotid: Velocities in the left ICA are consistent with a 1-39% stenosis.               Non-hemodynamically significant plaque noted in the CCA. Vertebrals:  Bilateral vertebral arteries demonstrate antegrade flow. Subclavians: Left subclavian artery flow was disturbed due to left arm AVF.              Normal flow hemodynamics were seen in the right subclavian artery. *See table(s) above for measurements and observations.  Electronically signed by Jenkins Rouge MD on 03/01/2018 at 1:38:41 PM.    Final    Vas [place] Lower Extremity Arterial Duplex  Result Date: 03/01/2018 LOWER EXTREMITY ARTERIAL DUPLEX STUDY Indications: Peripheral artery disease. 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 High Risk Factors: Hypertension, hyperlipidemia, Diabetes, current smoker,                    coronary artery disease.  Vascular Interventions: S/P left AKA for extensive LLE wounds with no                         revascularization options in 2/19 by Dr. Donzetta Matters. Arterial                         dopplers in 6/17 showed occluded right SFA. Current ABI:            Right ABI is non-compressible. Left AKA Comparison Study: Arterial duplex performed at [facility] in 6/17 noted a short-segment                   occlusion of the right SFA. Performing Technologist:  Mariane Masters RVT  Examination Guidelines: A complete evaluation includes B-mode imaging, spectral Doppler, color Doppler, and power Doppler as needed of all accessible portions of each vessel. Bilateral testing is considered an integral part of a complete examination. Limited examinations for reoccurring indications may be performed as noted.  Right Duplex Findings: +-----------+--------+-----+---------------+----------+---------------------+            PSV cm/sRatioStenosis       Waveform  Comments              +-----------+--------+-----+---------------+----------+---------------------+ CFA Prox   126  triphasic plaque                +-----------+--------+-----+---------------+----------+---------------------+ DFA        174          30-49% stenosisbiphasic  plaque                +-----------+--------+-----+---------------+----------+---------------------+ SFA Prox   62                          biphasic  plaque                +-----------+--------+-----+---------------+----------+---------------------+ SFA Mid    57                          biphasic  calcific plaque       +-----------+--------+-----+---------------+----------+---------------------+ SFA Distal 0            occluded                 dense calcific plaque +-----------+--------+-----+---------------+----------+---------------------+ POP Prox   22                          monophasicreconstituted flow    +-----------+--------+-----+---------------+----------+---------------------+ POP Distal 36                          monophasicplaque                +-----------+--------+-----+---------------+----------+---------------------+ ATA Prox   120                         monophasic                      +-----------+--------+-----+---------------+----------+---------------------+ ATA Mid                                monophasic                       +-----------+--------+-----+---------------+----------+---------------------+ ATA Distal 40                          monophasic                      +-----------+--------+-----+---------------+----------+---------------------+ PTA Prox   23                          monophasic                      +-----------+--------+-----+---------------+----------+---------------------+ PTA Mid    51                          monophasic                      +-----------+--------+-----+---------------+----------+---------------------+ PTA Distal 38                          monophasic                      +-----------+--------+-----+---------------+----------+---------------------+ PERO Prox  0            occluded  absent                          +-----------+--------+-----+---------------+----------+---------------------+ PERO Mid   11                          monophasic                      +-----------+--------+-----+---------------+----------+---------------------+ PERO Distal18                          monophasic                      +-----------+--------+-----+---------------+----------+---------------------+  Summary: Right: Known total occlusion noted in the distal superficial femoral artery with reconstituted flow distally. Total occlusion noted in the proximal peroneal artery with reconstituted flow distally.  See table(s) above for measurements and observations. See ABI report. Vascular consult recommended if clinically indicated, otherwise follow-up PRN. Electronically signed by Jenkins Rouge MD on 03/01/2018 at 2:38:58 PM.    Final     Ct Abdomen Pelvis Wo Contrast  Result Date: 03/28/2018 CLINICAL DATA:  Nausea, vomiting EXAM: CT ABDOMEN AND PELVIS WITHOUT CONTRAST TECHNIQUE: Multidetector CT imaging of the abdomen and pelvis was performed following the standard protocol without IV contrast. COMPARISON:  None. FINDINGS: Lower chest: No confluent opacities or  effusions. Hepatobiliary: No focal hepatic abnormality. Gallbladder unremarkable. Pancreas: No focal abnormality or ductal dilatation. Spleen: Calcifications in the spleen.  Normal size. Adrenals/Urinary Tract: Renovascular calcifications. No urinary tract stones or hydronephrosis. Exophytic low-density lesion off the lower pole of the left kidney likely reflects a cyst although this cannot be fully characterized on this unenhanced study. This measures 2 cm. Adrenal glands and urinary bladder unremarkable. Stomach/Bowel: Large stool burden throughout the colon. Normal appendix. Stomach and small bowel grossly unremarkable unenhanced appearance. Vascular/Lymphatic: 3.3 cm infrarenal abdominal aortic aneurysm extending into both common iliac arteries which measure 2.4 cm on the right and 2.2 cm on the left. No adenopathy. Reproductive: No visible focal abnormality. Other: No free fluid or free air. Musculoskeletal: No acute bony abnormality. IMPRESSION: Infrarenal abdominal aortic aneurysm, 3.3 cm maximally. Recommend followup by ultrasound in 3 years. This recommendation follows ACR consensus guidelines: White Paper of the ACR Incidental Findings Committee II on Vascular Findings. J Am Coll Radiol 2013; 10:789-794. Aortic aneurysm NOS (ICD10-I71.9) Large stool burden throughout the colon. No acute findings in the abdomen or pelvis. Electronically Signed   By: Rolm Baptise M.D.   On: 03/28/2018 21:47   Dg Chest 2 View  Result Date: 03/28/2018 CLINICAL DATA:  Shortness of breath EXAM: CHEST - 2 VIEW COMPARISON:  09/23/2017 FINDINGS: Cardiomegaly. Prior CABG. No confluent airspace opacities, effusions or edema. No acute bony abnormality. IMPRESSION: Cardiomegaly.  No active disease. Electronically Signed   By: Rolm Baptise M.D.   On: 03/28/2018 20:07   Dg Abd 2 Views  Result Date: 03/29/2018 CLINICAL DATA:  Acute left upper quadrant abdominal pain. EXAM: ABDOMEN - 2 VIEW COMPARISON:  CT scan of March 28, 2018. Radiograph of July 10, 2017. FINDINGS: The bowel gas pattern is normal. There is no evidence of free air. No radio-opaque calculi or other significant radiographic abnormality is seen. IMPRESSION: No evidence of bowel obstruction or ileus. Electronically Signed   By: Marijo Conception, M.D.   On: 03/29/2018 16:38   Vas [place] Burnard Bunting With/wo Tbi  Result Date: 03/01/2018 LOWER EXTREMITY DOPPLER STUDY Indications: Peripheral artery disease. High Risk Factors: Hypertension, hyperlipidemia, Diabetes, current smoker,                    coronary artery disease.  Vascular Interventions: S/P left AKA for extensive LLE wounds with no                         revascularization options in 2/19 by Dr. Donzetta Matters. Arterial                         dopplers in 6/17 showed occluded right SFA. Comparison Study: Previous ABI's performed in 2/19 show non-compressible ABI's                   with monophasic tibial waveforms bilaterally Performing Technologist: Mariane Masters RVT  Examination Guidelines: A complete evaluation includes at minimum, Doppler waveform signals and systolic blood pressure reading at the level of bilateral brachial, anterior tibial, and posterior tibial arteries, when vessel segments are accessible. Bilateral testing is considered an integral part of a complete examination. Photoelectric Plethysmograph (PPG) waveforms and toe systolic pressure readings are included as required and additional duplex testing as needed. Limited examinations for reoccurring indications may be performed as noted.  ABI Findings: +---------+------------------+-----+-------------------+--------+ Right    Rt Pressure (mmHg)IndexWaveform           Comment  +---------+------------------+-----+-------------------+--------+ Brachial 158                                                +---------+------------------+-----+-------------------+--------+ ATA      254               1.61 dampened monophasic          +---------+------------------+-----+-------------------+--------+ PTA      254               1.61 dampened monophasic         +---------+------------------+-----+-------------------+--------+ PERO     254               1.61 dampened monophasic         +---------+------------------+-----+-------------------+--------+ Great Toe76                0.48                             +---------+------------------+-----+-------------------+--------+ +--------+------------------+-----+--------+--------------------------------+ Left    Lt Pressure (mmHg)IndexWaveformComment                          +--------+------------------+-----+--------+--------------------------------+ Brachial                               Not obtained due to left arm AVF +--------+------------------+-----+--------+--------------------------------+                                        Left AKA                         +--------+------------------+-----+--------+--------------------------------+ +-------+-----------+-----------+------------+------------+ ABI/TBIToday's ABIToday's TBIPrevious ABIPrevious TBI +-------+-----------+-----------+------------+------------+ Right  [place]  0.48                 0.33         +-------+-----------+-----------+------------+------------+ Left   AKA                                            +-------+-----------+-----------+------------+------------+ Arterial wall calcification precludes accurate ankle pressures and ABIs. Right ABIs appear essentially unchanged compared to prior study on 2/19.  Summary: Right: Resting right ankle-brachial index indicates noncompressible right lower extremity arteries.The right toe-brachial index is abnormal. Left: Left above-knee amputation.  *See table(s) above for measurements and observations. See right arterial duplex report.  Electronically signed by Jenkins Rouge MD on 03/01/2018 at 1:38:16 PM.    Final    Vas US  Carotid  Result Date: 03/01/2018 Carotid Arterial Duplex Study Indications:       Left carotid Bruit. Risk Factors:      Hypertension, hyperlipidemia, Diabetes, current smoker,                    coronary artery disease, PAD. Comparison Study:  Previous carotid duplex performed at [facility] in                    2015 showed 1-39% stenosis bilaterally. Performing Technologist: Mariane Masters RVT  Examination Guidelines: A complete evaluation includes B-mode imaging, spectral Doppler, color Doppler, and power Doppler as needed of all accessible portions of each vessel. Bilateral testing is considered an integral part of a complete examination. Limited examinations for reoccurring indications may be performed as noted.  Right Carotid Findings: +----------+--------+--------+--------+-------------------------+--------+           PSV cm/sEDV cm/sStenosisDescribe                 Comments +----------+--------+--------+--------+-------------------------+--------+ CCA Prox  115     11                                                +----------+--------+--------+--------+-------------------------+--------+ CCA Mid   68      13      <50%    heterogenous                      +----------+--------+--------+--------+-------------------------+--------+ CCA Distal55      14                                                +----------+--------+--------+--------+-------------------------+--------+ ICA Prox  88      37      1-39%   heterogenous and calcific         +----------+--------+--------+--------+-------------------------+--------+ ICA Mid   87      26                                                +----------+--------+--------+--------+-------------------------+--------+ ICA Distal79      29  tortuous +----------+--------+--------+--------+-------------------------+--------+ ECA       78      15                                                 +----------+--------+--------+--------+-------------------------+--------+ +----------+--------+-------+----------------+-------------------+           PSV cm/sEDV cmsDescribe        Arm Pressure (mmHG) +----------+--------+-------+----------------+-------------------+ [id number]            Multiphasic, [id number]                 +----------+--------+-------+----------------+-------------------+ +---------+--------+--+--------+-+---------+ VertebralPSV cm/s60EDV cm/s9Antegrade +---------+--------+--+--------+-+---------+  Left Carotid Findings: +----------+--------+--------+--------+-------------------------+--------+           PSV cm/sEDV cm/sStenosisDescribe                 Comments +----------+--------+--------+--------+-------------------------+--------+ CCA Prox  74      8                                                 +----------+--------+--------+--------+-------------------------+--------+ CCA Distal81      10      <50%    heterogenous                      +----------+--------+--------+--------+-------------------------+--------+ ICA Prox  68      25      1-39%   heterogenous and calcific         +----------+--------+--------+--------+-------------------------+--------+ ICA Mid   46      19                                                +----------+--------+--------+--------+-------------------------+--------+ ICA Distal47      23                                                +----------+--------+--------+--------+-------------------------+--------+ ECA       71      9                                                 +----------+--------+--------+--------+-------------------------+--------+ +----------+--------+--------+--------------------+-------------------+ SubclavianPSV cm/sEDV cm/sDescribe            Arm Pressure (mmHG) +----------+--------+--------+--------------------+-------------------+           126              Turbulent due to AVF                    +----------+--------+--------+--------------------+-------------------+ +---------+--------+--+--------+--+---------+ VertebralPSV cm/s82EDV cm/s24Antegrade +---------+--------+--+--------+--+---------+ Left brachial pressure not obtained due to AVF  Summary: Right Carotid: Velocities in the right ICA are consistent with a 1-39% stenosis.                Non-hemodynamically significant plaque <50% noted in the CCA. Left Carotid: Velocities in the left ICA are consistent with a 1-39% stenosis.  Non-hemodynamically significant plaque noted in the CCA. Vertebrals:  Bilateral vertebral arteries demonstrate antegrade flow. Subclavians: Left subclavian artery flow was disturbed due to left arm AVF.              Normal flow hemodynamics were seen in the right subclavian artery. *See table(s) above for measurements and observations.  Electronically signed by Jenkins Rouge MD on 03/01/2018 at 1:38:41 PM.    Final    Vas [place] Lower Extremity Arterial Duplex  Result Date: 03/01/2018 LOWER EXTREMITY ARTERIAL DUPLEX STUDY Indications: Peripheral artery disease. 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 High Risk Factors: Hypertension, hyperlipidemia, Diabetes, current smoker,                    coronary artery disease.  Vascular Interventions: S/P left AKA for extensive LLE wounds with no                         revascularization options in 2/19 by Dr. Donzetta Matters. Arterial                         dopplers in 6/17 showed occluded right SFA. Current ABI:            Right ABI is non-compressible. Left AKA Comparison Study: Arterial duplex performed at [facility] in 6/17 noted a short-segment                   occlusion of the right SFA. Performing Technologist: Mariane Masters RVT  Examination Guidelines: A complete evaluation includes B-mode imaging, spectral Doppler, color Doppler, and power Doppler as needed of all  accessible portions of each vessel. Bilateral testing is considered an integral part of a complete examination. Limited examinations for reoccurring indications may be performed as noted.  Right Duplex Findings: +-----------+--------+-----+---------------+----------+---------------------+            PSV cm/sRatioStenosis       Waveform  Comments              +-----------+--------+-----+---------------+----------+---------------------+ CFA Prox   126                         triphasic plaque                +-----------+--------+-----+---------------+----------+---------------------+ DFA        174          30-49% stenosisbiphasic  plaque                +-----------+--------+-----+---------------+----------+---------------------+ SFA Prox   62                          biphasic  plaque                +-----------+--------+-----+---------------+----------+---------------------+ SFA Mid    57                          biphasic  calcific plaque       +-----------+--------+-----+---------------+----------+---------------------+ SFA Distal 0            occluded                 dense calcific plaque +-----------+--------+-----+---------------+----------+---------------------+ POP Prox   22  monophasicreconstituted flow    +-----------+--------+-----+---------------+----------+---------------------+ POP Distal 36                          monophasicplaque                +-----------+--------+-----+---------------+----------+---------------------+ ATA Prox   120                         monophasic                      +-----------+--------+-----+---------------+----------+---------------------+ ATA Mid                                monophasic                      +-----------+--------+-----+---------------+----------+---------------------+ ATA Distal 40                          monophasic                       +-----------+--------+-----+---------------+----------+---------------------+ PTA Prox   23                          monophasic                      +-----------+--------+-----+---------------+----------+---------------------+ PTA Mid    51                          monophasic                      +-----------+--------+-----+---------------+----------+---------------------+ PTA Distal 38                          monophasic                      +-----------+--------+-----+---------------+----------+---------------------+ PERO Prox  0            occluded       absent                          +-----------+--------+-----+---------------+----------+---------------------+ PERO Mid   11                          monophasic                      +-----------+--------+-----+---------------+----------+---------------------+ PERO Distal18                          monophasic                      +-----------+--------+-----+---------------+----------+---------------------+  Summary: Right: Known total occlusion noted in the distal superficial femoral artery with reconstituted flow distally. Total occlusion noted in the proximal peroneal artery with reconstituted flow distally.  See table(s) above for measurements and observations. See ABI report. Vascular consult recommended if clinically indicated, otherwise follow-up PRN. Electronically signed by Jenkins Rouge MD on 03/01/2018 at 2:38:58 PM.    Final    PATHOLOGY:  Diagnosis 1. Stomach, biopsy, Gastric - ADENOCARCINOMA. - HIGH GRADE  GLANDULAR DYSPLASIA AND INTESTINAL METAPLASIA. - WARTHIN-STARRY STAIN NEGATIVE FOR HELICOBACTER PYLORI. 2. Stomach, biopsy - ANTRAL AND OXYNTIC MUCOSA WITH SLIGHT INFLAMMATION AND INTESTINAL METAPLASIA. - WARTHIN-STARRY STAIN NEGATIVE FOR HELICOBACTER PYLORI. - NO DYSPLASIA OR MALIGNANCY. Microscopic Comment 1. Dr. Vic Ripper has reviewed part 1 of this case and agrees. Called to Dr. Rush Landmark on  03/30/18. (JDP:gt, 03/30/18) Claudette Laws MD Pathologist, Electronic Signature (Case signed 03/30/2018)  Assessment and Plan:   This is a 68 year old African-American male with:  1.  Newly diagnosed gastric adenocarcinoma.  CT of the abdomen and pelvis has been completed.  Will order a CT of the chest to complete the staging work-up.  This will be done without contrast due to his stage IV CKD.  If there is no evidence of metastatic disease, will consult general surgery for recommendations.   2.  Anemia.  This is multifactorial due to his stage IV CKD and recent hematemesis.  The patient has required Aranesp injections and blood transfusions in the past.  He has received 4 units of packed red blood cells so far in the hospital.  Hemoglobin is stable today at 8.0.  He denies active bleeding.  Will add on iron studies and ferritin today.  Will replete if deficient.  Transfuse if his hemoglobin is less than 7.0 or active bleeding.  3.  MGUS.  Last myeloma panel was performed in June 2019.  M spike was undetectable at that time.  Light chains have been high in the past but likely due to renal disease.  No need for repeat myeloma panel at this time.   Thank you for this referral.   Leonard Bussing, DNP, AGPCNP-BC, AOCNP  I have seen, examined the patient, reviewed his chart and imaging and discussed with the patient with the plan of care Summary of recommendation:  1) CT chest to complete staging. If no signs of disease spread, recommend general surgery consult for consideration for resection 2) GI tumor board discussion next week 3) Overall, poor chemotherapy candidate due to multiple medical co-morbidities and poor medical compliance. Personally, I would not recommend chemotherapy due to high risk of nausea, dehydration and pancytopenia. If he develops chemotherapy complications (almost certainly he would), if could render him with end stage renal failure requiring hemodialysis. I recommend  palliative care consult for goals of care clarification 4) continue close blood count monitoring and transfuse to keep hemoglobin above 8  I will return either tomorrow or Friday to review final recommendation. Please call if questions arise  Heath Lark, MD 03/31/2018

## 2018-03-31 NOTE — Care Management Important Message (Signed)
Important Message  Patient Details  Name: DESHAN HEMMELGARN MRN: [id number] Date of Birth: [birth year]   Medicare Important Message Given:  Yes    Carles Collet, RN 03/31/2018, 3:00 PM

## 2018-03-31 NOTE — Progress Notes (Signed)
Family Medicine Teaching Service Daily Progress Note Intern Pager: [phone]  [name] Medical record number: [id number] Date of birth: 1950/11/05 Age: 68 y.o. Gender: male  Primary Care Provider: Kathrene Alu, MD Consultants: IP CONSULT TO GASTROENTEROLOGY Code Status: Full Code   Pt Overview and Major Events to Date:  Admitted: 04-16-2018 for CC: Abnormal Lab  Hospital Day: 4  Assessment and Plan: Leonard [name] is a 68 y.o. male admitted for multiple episodes of hematemesis and significant anemia.  Patient diagnosed with gastric adenocarcinoma after EGD biopsy on 2/24. His chronic conditions include CAD status post CABG, peripheral vascular disease status post left AKA, diabetes, diastolic CHF, hypertension.   #Acute GI Bleeding in setting of new Gastric Adenocarcinoma  Hgb 8.0 this morning and stable from yesterday, s/p 4 units pRBC. GI on board, for GIB. Bleeding and hemoglobin stable at this time.  Transfusion goal >7.0  Start BID 40mg  PO PTX tomorrow   Mirilax daily starting tomorrow   GI Signed off   Oncology consult pending per GI to assess ongoing bleeding  #Anemia of chronic disease  Multifactorial in setting of CKD stage IV.  Monthly Aranesp injections and hx of several previous blood transfusions.  Follow-up iron studies and ferritin per medical oncology  Hold on restarting iron supplements  #HTN w/ hx of infrarenal aneurysm & Diastolic HF  Patient initially n.p.o. due to GI bleed.  Currently on carvedilol 12.5 mg, clonidine patch q. Monday for hypertension control.  At home, patient on carvedilol, clonidine, doxazosin, hydralazine, torsemide plus Kader for hypertension.  Blood pressures have been in the 829F to 621H systolic.  Goal is to decrease blood pressure in the setting of infrarenal aortic aneurysm, but not to drop in the setting of acute GI bleed and volume loss.  Patient was complaining of mild stomach pain this morning.  But was  not tender to palpation.  Patient on amlodipine previously and dropped off during hospital stay.  Per outpatient notes, can consider adding back.  We will hold at this time.  Continue carvedilol, clonidine patch  Added back hydralazine 100 mg every 8 hours  Continue to monitor abdominal exam   Add back hypertensive medications as able   #CAD status post CABG  Holding ASA 81  Restart atorvastatin  #Thrombocytopenia, chronic   Continue to monitor in setting of GI bleed  CKD 4  Hold nephrotoxic medications   Hypothyroidism  Continue 100 mcg Synthroid  #FEN/GI:   . Fluids: none  . Electrolytes: wnl   . Nutrition: Full liquid diet    Access: Right PIV 04-16-22) VTE prophylaxis: Holding in the setting of GI bleed, SCD on right lower extremity  Disposition: Likely home, pending improvement in anemia and GI bleed    Subjective:  NAEO.   Objective: BP (!) 178/67   Pulse 86   Temp 98 F (36.7 C) (Oral)   Resp 17   Ht 5\' 8"  (1.727 m)   Wt 78 kg   SpO2 100%   BMI 26.15 kg/m   Physical Exam: Physical Exam Gen: NAD, alert, non-toxic, well-appearing, sitting comfortably  Skin: Warm and dry.  HEENT: NCAT.  MMM.  CV: RRR.  Normal S1-S2. No BLEE. Resp: CTAB. No increased WOB Abd: NTND. Pos bowel sounds Extremities: Warm and well perfused, left aka.   Laboratory: I have personally read and reviewed all labs and imaging studies.   CBC: Recent Labs  Lab 04/16/18 1758 03/29/18 0500 03/30/18 1008 03/30/18 2139 03/31/18 0335  WBC 6.7 5.9 4.6  --  5.6  NEUTROABS 5.3  --   --   --   --   HGB 5.9* 8.2* 6.8* 8.0* 8.0*  HCT 20.0* 25.9* 21.7* 24.2* 24.7*  MCV 90.9 87.2 87.5  --  85.5  PLT 93* 88* 97*  --  92*   Basic Metabolic Panel: Recent Labs  Lab 03/28/18 1758 03/29/18 0500 03/31/18 0335  NA 137 139 139  K 5.3* 5.2* 4.1  CL 113* 114* 115*  CO2 16* 16* 15*  GLUCOSE 187* 166* 159*  BUN 110* 106* 102*  CREATININE 3.63* 3.57* 2.97*  CALCIUM 8.3* 8.8* 8.3*    Wilber Oliphant, MD 03/31/2018, 9:49 AM PGY-1, [place] Intern pager: [phone], text pages welcome

## 2018-03-31 NOTE — Consult Note (Signed)
Consultation Note Date: 03/31/2018   [name]  DOB: 17-Nov-1950  MRN: [id number]  Age / Sex: 68 y.o., male  PCP: Leonard Alu, MD Referring Physician: Lind Covert, MD  Reason for Consultation: Establishing goals of care and Psychosocial/spiritual support  HPI/Patient Profile: 68 y.o. 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   Clinical Assessment and Goals of Care:  I have reviewed medical records including EPIC notes, labs and imaging, received report from the care team, talked with his nephrologist Dr. Marval [name], assessed the patient and talked with him at the bedside  to discuss diagnosis, GOC, disposition and options.  After speaking with Leonard [name] I talked with his sister Leonard [name] on the phone.  I introduced Palliative Medicine as specialized medical care for people living with serious illness. It focuses on providing relief from the symptoms and stress of a serious illness. The goal is to improve quality of life for both the patient and the family.  We discussed a brief life review of the patient.  He worked in multiple different jobs at [employer].  He never married or had children, but he has a very supportive family.  He strikes me as a very social person who enjoys being around people.  He lived with his sister Leonard [name] in [place] prior to being placed at SNF 10 months ago.  He wants very much to live with his sister again.  We discussed his current illness and and the process he is going thru. We talked about the results of his recent CT  scans, upcoming surgical consultation, and previous consultation with Dr. Alvy [name] (his long term hematologist). I attempted to elicit values and goals of care important to the patient.  Leonard [name] goal is to get back home to his sister's house.  He does not want to live in SNF.  He tells me "if you tell me I will be able to go live at home if I have surgery, chemo and hemodialysis, then I will go do it".   At the same time he also commented that he really does not want to go thru all of that. He seemed very open to direction from his medical providers.  We discussed code status.  Given his bleeding and his significant medical comorbidities, I made the recommendation that he not be coded.  We discussed receiving full scope treatment including intubation if he had a reversible condition.  Leonard [name] was in agreement with that recommendation.  We discussed symptoms and he complained of not being able to sleep.  We discussed a low dose anti-anxiety medication PRN at bedtime.  I talked with Leonard [name] on the phone and explained that  Leonard [name] has gastric cancer - which she did not understand.  We agreed to meet at 9:00 am on 2/27 for further discussion.  Questions and concerns were addressed.  The family was encouraged to call with questions or concerns.     Primary Decision Maker:  PATIENT.  His health care surrogate decision maker is his sister Leonard [name].    SUMMARY OF RECOMMENDATIONS    I'm concerned that [name] and his family are very simple people and do not understand the upcoming journey should he decide to pursue aggressive treatment.  His is a confusing picture because there is certainly treatment that could be done, but will it be helpful to his overall quality of life?  This very kind gentleman will agree with whatever his medical providers recommend to him.     Per Oncology he is a poor candidate for chemotherapy given likelihood of pancytopenia and complications.  Per Nephrology he was a  marginal candidate for hemodialysis prior to the cancer.  Surgery will weigh in soon.  It would be most beneficial to the patient and family if the multi-specialty medical team considered the overall picture and made 1 recommendation to Leonard [name] and his family.    PMT will follow up on 2/27 at 9:00 am.  Code Status/Advance Care Planning:  DNR if he arrests.  Otherwise full scope treatment including intubation if necessary.   Symptom Management:   Will add ativan 0.25 mg prn insomnia  Additional Recommendations (Limitations, Scope, Preferences):  Full Scope Treatment  Palliative Prophylaxis:   Bowel Regimen  Psycho-social/Spiritual:   Desire for further Chaplaincy support: not discussed today.  Prognosis:  Unable to determine at this point but it would not be surprising if he were to pass away in the next year.   Discharge Planning: [facility] for rehab with Palliative care service follow-up      Primary Diagnoses: Present on Admission: . Acute GI bleeding . COPD GOLD III  . CKD (chronic kidney disease) stage 4, GFR 15-29 ml/min (HCC) . Chronic diastolic CHF (congestive heart failure) ([place])   I have reviewed the medical record, interviewed the patient and family, and examined the patient. The following aspects are pertinent.  Past Medical History:  Diagnosis Date  . Acute on chronic diastolic heart failure ([place]) 07/04/2014  . Anemia of renal disease   . Atherosclerotic peripheral vascular disease with gangrene ([place]) 02/14/2017  . CAD (coronary artery disease) 04/06/2013  . Cancer ([place])    skin cancer-  . CHF (congestive heart failure) ([place])   . Chronic anemia 02/28/2016  . Chronic deep vein thrombosis (DVT) of femoral vein of left lower extremity ([place]) 09/03/2016  . Chronic respiratory failure with hypoxia ([place]) 08/25/2017  . CKD (chronic kidney disease) stage 4, GFR 15-29 ml/min (HCC) 01/15/2015   Follows with [facility].  Last visits:  07/23/16:  No change in meds. Worried that if we increase diuretics could lead to another AKI  . CKD (chronic kidney disease), stage IV ([place])    07/07/2017  . Claudication ([place])   . Collapse of left talus 01/14/2017  . COPD GOLD III  02/16/2014   Quit smoking 2014  - PFT's  04/06/13   FEV1 1.28 (44 % ) ratio 48  p no % improvement from saba p ?  prior to study with DLCO  43/54 % corrects to 97  % for alv volume - 01/31/2016  After extensive coaching HFA effectiveness =    75% from baseline 0 >  changed to sprivia respimat   - PFT's  03/17/2016  FEV1 2.25  (81 % ) ratio 88  p 2 % improvement from saba p spiriva respimat x 2  prior to study with  . Coronary artery disease   . Diabetes ([place])   . Diabetic polyneuropathy associated with type 2 diabetes mellitus ([place]) 01/14/2017  . Diabetic retinopathy ([place]) 11/13/2015   Per Dr. Erenest Rasher eye exam in 10/17/15, mild background diabetic retinopathy  . Fatigue associated with anemia 01/21/2016  . Gangrene of toe ([place])    right 5th/notes 01/04/2013   . HCAP (healthcare-associated pneumonia)   . Hematuria 06/27/2015  . High cholesterol   . History of tobacco use 08/23/2013   Smoked pack per day for 46 years Quit smoking - 02/03/2013   . Hypertension   . Hypothyroidism 11/22/2014  . Idiopathic chronic gout of left foot without tophus   . Iron deficiency anemia 08/03/2015  . Left foot pain 10/29/2016  . Long term (current) use of anticoagulants [Z79.01] 09/09/2016  . 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 Mixed hyperlipidemia 07/17/2008  . Morbid obesity due to excess calories ([place]) 03/17/2016  . PAD (peripheral artery disease) ([place])   . Pleural effusion   . Pneumonitis 07/26/2017  . Pulmonary artery hypertension ([place]) 07/25/2017  . PVD - hx of Rt SFA PTA and s/p Rt 4-5th toe amp Dec 2014 04/05/2013  . 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 Respiratory failure ([place]) 08/13/2017  . Respiratory failure with hypoxia ([place]) 09/23/2017  . 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    . Secondary hypertension   . Shortness of breath dyspnea    with exertion  . Skin lesion of face 02/22/2015  . SOB (shortness of breath)   . Type 2 diabetes mellitus with pressure callus ([place]) 12/29/2016  . Type II diabetes mellitus (HCC)    Type II  . Type II diabetes mellitus with complication [facility])    Social History   Socioeconomic History  . Marital status: Single    Spouse name: Not on file  . Number of children: 0  . Years of education: Not on file  . Highest education level: Not on file  Occupational History  . Not on file  Social Needs  . Financial resource strain: Not on file  . Food insecurity:    Worry: Not on file    Inability: Not on file  . Transportation needs:    Medical: Not on file    Non-medical: Not on file  Tobacco Use  . Smoking status: Former Smoker    Packs/day: 1.00    Years: 46.00    Pack years: 46.00    Types: Cigarettes    Start date: 02/04/1967    Last attempt to quit: 02/03/2013    Years since quitting: 5.1  . Smokeless tobacco: Never Used  Substance and Sexual Activity  . Alcohol use: No    Alcohol/week: 0.0 standard drinks  . Drug use: No  . Sexual activity: Not Currently  Lifestyle  . Physical activity:    Days per week: Not on file    Minutes per session: Not on file  . Stress: Not on file  Relationships  . Social connections:    Talks on phone: Not on file    Gets together: Not on file    Attends religious service: Not on file    Active member of club or organization: Not on file    Attends meetings of clubs or organizations: Not on  file    Relationship status: Not on file  Other Topics Concern  . Not on file  Social History Narrative   Retired - former Sports coach.   Patient in skilled nursing facility.   Family History  Problem Relation Age of Onset  . Cancer Mother        lung cancer  . Hypertension Mother   . Cancer Sister        patient thinks it was uterine cancer  . Hypertension Sister   . Heart attack  Sister   . Stroke Neg Hx    Scheduled Meds: . carvedilol  12.5 mg Oral BID WC  . cloNIDine  0.2 mg Transdermal Q Mon  . hydrALAZINE  100 mg Oral Q8H  . insulin aspart  0-9 Units Subcutaneous Q4H  . levothyroxine  100 mcg Oral Q0600  . [START ON 04/01/2018] pantoprazole  40 mg Oral BID  . [START ON 04/01/2018] polyethylene glycol  17 g Oral Daily  . polyethylene glycol  34 g Oral Once   Continuous Infusions: . pantoprozole (PROTONIX) infusion 8 mg/hr (03/31/18 1158)   PRN Meds:.acetaminophen **OR** acetaminophen, ipratropium-albuterol, ondansetron **OR** ondansetron (ZOFRAN) IV Allergies  Allergen Reactions  . Penicillins Other (See Comments)    Has patient had a PCN reaction causing immediate rash, facial/tongue/throat swelling, SOB or lightheadedness with hypotension: Unknown Has patient had a PCN reaction causing severe rash involving mucus membranes or skin necrosis: Unknown Has patient had a PCN reaction that required hospitalization: Unknown Has patient had a PCN reaction occurring within the last 10 years: Unknown If all of the above answers are "NO", then may proceed with Cephalosporin use.    Review of Systems no constipation, no SOB, no CP.  Complains of some abdominal pain and insomnia.  Physical Exam  Pleasant male, awake, alert, orientated, coherent, appropriate.  Vital Signs: BP (!) 176/63 (BP Location: Right Arm)   Pulse 99   Temp 97.9 F (36.6 C) (Oral)   Resp 17   Ht 5\' 8"  (1.727 m)   Wt 78 kg   SpO2 100%   BMI 26.15 kg/m  Pain Scale: 0-10   Pain Score: 0-No pain   SpO2: SpO2: 100 % O2 Device:SpO2: 100 % O2 Flow Rate: .O2 Flow Rate (L/min): 2 L/min  IO: Intake/output summary:   Intake/Output Summary (Last 24 hours) at 03/31/2018 1630 Last data filed at 03/31/2018 1605 Gross per 24 hour  Intake 1041 ml  Output 2100 ml  Net -1059 ml    LBM: Last BM Date: 03/30/18 Baseline Weight: Weight: 78 kg Most recent weight: Weight: 78 kg     Palliative  Assessment/Data: 60%     Time In: 4:30 Time Out: 5:50 Time Total: 80 min. Greater than 50%  of this time was spent counseling and coordinating care related to the above assessment and plan.  Signed by: Florentina Jenny, PA-C Palliative Medicine Pager: [phone]  Please contact Palliative Medicine Team phone at [phone] for questions and concerns.  For individual provider: See Shea Evans

## 2018-04-01 ENCOUNTER — Other Ambulatory Visit: Payer: Self-pay | Admitting: Hematology and Oncology

## 2018-04-01 ENCOUNTER — Encounter: Payer: Self-pay | Admitting: Gastroenterology

## 2018-04-01 ENCOUNTER — Encounter: Payer: Self-pay | Admitting: Hematology and Oncology

## 2018-04-01 DIAGNOSIS — R109 Unspecified abdominal pain: Secondary | ICD-10-CM

## 2018-04-01 DIAGNOSIS — N189 Chronic kidney disease, unspecified: Secondary | ICD-10-CM

## 2018-04-01 DIAGNOSIS — D649 Anemia, unspecified: Secondary | ICD-10-CM

## 2018-04-01 DIAGNOSIS — I5032 Chronic diastolic (congestive) heart failure: Secondary | ICD-10-CM

## 2018-04-01 DIAGNOSIS — N179 Acute kidney failure, unspecified: Secondary | ICD-10-CM

## 2018-04-01 DIAGNOSIS — N184 Chronic kidney disease, stage 4 (severe): Secondary | ICD-10-CM

## 2018-04-01 DIAGNOSIS — C801 Malignant (primary) neoplasm, unspecified: Secondary | ICD-10-CM

## 2018-04-01 LAB — CBC
HCT: 24.1 % — ABNORMAL LOW (ref 39.0–52.0)
Hemoglobin: 7.6 g/dL — ABNORMAL LOW (ref 13.0–17.0)
MCH: 27.2 pg (ref 26.0–34.0)
MCHC: 31.5 g/dL (ref 30.0–36.0)
MCV: 86.4 fL (ref 80.0–100.0)
Platelets: 92 10*3/uL — ABNORMAL LOW (ref 150–400)
RBC: 2.79 MIL/uL — ABNORMAL LOW (ref 4.22–5.81)
RDW: 15.6 % — ABNORMAL HIGH (ref 11.5–15.5)
WBC: 5.8 10*3/uL (ref 4.0–10.5)
nRBC: 0 % (ref 0.0–0.2)

## 2018-04-01 LAB — HEMOGLOBIN AND HEMATOCRIT, BLOOD
HEMATOCRIT: 27.8 % — AB (ref 39.0–52.0)
Hemoglobin: 8.9 g/dL — ABNORMAL LOW (ref 13.0–17.0)

## 2018-04-01 LAB — GLUCOSE, CAPILLARY
GLUCOSE-CAPILLARY: 142 mg/dL — AB (ref 70–99)
Glucose-Capillary: 115 mg/dL — ABNORMAL HIGH (ref 70–99)
Glucose-Capillary: 134 mg/dL — ABNORMAL HIGH (ref 70–99)
Glucose-Capillary: 135 mg/dL — ABNORMAL HIGH (ref 70–99)
Glucose-Capillary: 142 mg/dL — ABNORMAL HIGH (ref 70–99)
Glucose-Capillary: 177 mg/dL — ABNORMAL HIGH (ref 70–99)
Glucose-Capillary: 233 mg/dL — ABNORMAL HIGH (ref 70–99)

## 2018-04-01 LAB — PREPARE RBC (CROSSMATCH)

## 2018-04-01 LAB — BASIC METABOLIC PANEL
Anion gap: 6 (ref 5–15)
BUN: 70 mg/dL — AB (ref 8–23)
CO2: 17 mmol/L — ABNORMAL LOW (ref 22–32)
Calcium: 8.3 mg/dL — ABNORMAL LOW (ref 8.9–10.3)
Chloride: 117 mmol/L — ABNORMAL HIGH (ref 98–111)
Creatinine, Ser: 2.49 mg/dL — ABNORMAL HIGH (ref 0.61–1.24)
GFR calc Af Amer: 30 mL/min — ABNORMAL LOW (ref >60)
GFR calc non Af Amer: 26 mL/min — ABNORMAL LOW (ref >60)
Glucose, Bld: 146 mg/dL — ABNORMAL HIGH (ref 70–99)
Potassium: 4.3 mmol/L (ref 3.5–5.1)
SODIUM: 140 mmol/L (ref 135–145)

## 2018-04-01 MED ORDER — SENNA 8.6 MG PO TABS: 1.0000 | ORAL_TABLET | Freq: Every evening | ORAL | Status: DC | PRN

## 2018-04-01 MED ORDER — FERROUS SULFATE 325 (65 FE) MG PO TABS: 325.0000 mg | ORAL_TABLET | Freq: Two times a day (BID) | ORAL | Status: DC

## 2018-04-01 MED ORDER — OXYCODONE HCL 5 MG PO TABS: 5.0000 mg | ORAL_TABLET | Freq: Four times a day (QID) | ORAL | Status: DC | PRN

## 2018-04-01 MED ORDER — DOXAZOSIN MESYLATE 8 MG PO TABS
8.0000 mg | ORAL_TABLET | Freq: Every evening | ORAL | Status: DC
  Administered 2018-04-01 – 2018-04-02 (×2): 8 mg via ORAL
  Filled 2018-04-01 (×2): qty 1

## 2018-04-01 MED ORDER — SODIUM CHLORIDE 0.9% IV SOLUTION
Freq: Once | INTRAVENOUS | Status: AC
  Administered 2018-04-01: 12:00:00 via INTRAVENOUS

## 2018-04-01 MED ORDER — MORPHINE SULFATE (PF) 2 MG/ML IV SOLN: 2.0000 mg | INTRAVENOUS | Status: DC | PRN

## 2018-04-01 MED ORDER — DOXAZOSIN MESYLATE 8 MG PO TABS: 8.0000 mg | ORAL_TABLET | Freq: Every evening | ORAL | Status: DC

## 2018-04-01 MED ORDER — MORPHINE SULFATE (CONCENTRATE) 10 MG/0.5ML PO SOLN
5.0000 mg | ORAL | Status: DC | PRN
  Administered 2018-04-01 – 2018-04-02 (×3): 5 mg via ORAL
  Filled 2018-04-01 (×3): qty 0.5

## 2018-04-01 MED ORDER — ATORVASTATIN CALCIUM 40 MG PO TABS
40.0000 mg | ORAL_TABLET | Freq: Every day | ORAL | Status: DC
  Administered 2018-04-01 – 2018-04-02 (×2): 40 mg via ORAL
  Filled 2018-04-01 (×2): qty 1

## 2018-04-01 NOTE — Progress Notes (Addendum)
Daily Progress Note   [name]       Date: 04/01/2018 DOB: 05-Jan-1951  Age: 68 y.o. MRN#: [id number] Attending Physician: Lind Covert, MD Primary Care Physician: Kathrene Alu, MD Admit Date: 03/28/2018  Reason for Consultation/Follow-up: Establishing goals of care and Psychosocial/spiritual support  Subjective: After speaking with the attending team and [name] this morning I met with the patient and his family.  The patient complained of severe stomach pain last night and he is very hungry this morning.  He states the pain felt like a strong aching "it was so bad I almost called the doctor!"  Tylenol did not relieve the pain.  We called an ordered potato soup from the cafeteria which he quickly ate.  "I'm always hungry".   He mentioned not having a bowel movement - it is difficult to have a bowel movement on the bed pan.  At SNF he was able to get himself out of bed to wheel chair with his arms and he was mobile.  He does not want to lose that mobility.    I met Leonard [name] and [name].  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  Even though Leonard [name] is the HCPOA the family makes decisions as a unit.  [name] is more available for phone conversations than Leonard [name] as Leonard [name] works.  The family feels it is important to have good communication with the doctors.  They are concerned that Leonard [name] does not understand everything that is being said.  We discussed the EGD findings of gastric cancer.  We looked at pictures of the stomach and the pylorus.  We reviewed Dr. Calton Dach recommendations (she is in agreement with supportive care including blood transfusions, but does not recommend chemotherapy).   We reviewed the chest CT which does not show metastases.   I explained to the family that a surgical opinion was coming and that they may recommend surgery or not.  The patient has multiple conditions that make him a very poor surgical candidate (heart, kidneys, severe PAD).  The family understood.  They asked about radiation therapy.  I explained that a surgical opinion comes first.  Last night Leonard [name] was presented with a code status discussion and he choose DNR.  The  family was concerned about this.  We talked about his bleeding and the AAA.  I explained to the family that if Mr. Leonard [name] arrested (and died), CPR would not be successful as it would likely cause even more bleeding given his cancer and AAA.  While the family saw the logic medically they asked if we could reverse the DNR decision from last night and post pone the discussion for now.   I agreed.  We discussed the Davidson family's faith.  They are [facility] and derive a great deal of strength from their faith.    The family was very grateful for the conversation and asked that Palliative continue to work with them.   Assessment: Patient with pain overnight, constipation, hungry.  Stable.  Hgb dropped slightly.  He will be transfused another (5th) unit of blood today and Surgery will likely see him.     Patient Profile/HPI:  68 y.o. 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   Length of Stay: 4  Current Medications: Scheduled Meds:  . sodium chloride   Intravenous Once  . amLODipine  5 mg Oral Daily  . atorvastatin  40 mg Oral q1800  . carvedilol  12.5 mg Oral BID WC  . cloNIDine  0.2 mg  Transdermal Q Mon  . doxazosin  8 mg Oral QPM  . hydrALAZINE  100 mg Oral Q8H  . insulin aspart  0-9 Units Subcutaneous Q4H  . levothyroxine  100 mcg Oral Q0600  . pantoprazole  40 mg Oral BID  . polyethylene glycol  17 g Oral Daily    Continuous Infusions:   PRN Meds: acetaminophen **OR** acetaminophen, ipratropium-albuterol, LORazepam, ondansetron **OR** ondansetron (ZOFRAN) IV  Physical Exam        Very pleasant male, sitting up in bed, awake, alert, but sleepy.  He tunes out the conversation.  (would rather talk about football)  Vital Signs: BP (!) 169/61 (BP Location: Right Arm)   Pulse 94   Temp 98.1 F (36.7 C) (Oral)   Resp 16   Ht '5\' 8"'  (1.727 m)   Wt 78 kg   SpO2 100%   BMI 26.15 kg/m  SpO2: SpO2: 100 % O2 Device: O2 Device: Room Air O2 Flow Rate: O2 Flow Rate (L/min): 2 L/min  Intake/output summary:   Intake/Output Summary (Last 24 hours) at 04/01/2018 1049 Last data filed at 04/01/2018 1034 Gross per 24 hour  Intake 847 ml  Output 1525 ml  Net -678 ml   LBM: Last BM Date: 03/30/18 Baseline Weight: Weight: 78 kg Most recent weight: Weight: 78 kg       Palliative Assessment/Data: 50%    Flowsheet Rows     Most Recent Value  Intake Tab  Referral Department  Oncology  Unit at Time of Referral  Intermediate Care Unit  Palliative Care Primary Diagnosis  Cancer  Date Notified  03/31/18  Palliative Care Type  Return patient Palliative Care  Reason for referral  Clarify Goals of Care  Date of Admission  03/28/18  Date first seen by Palliative Care  03/31/18  # of days Palliative referral response time  0 Day(s)  # of days IP  prior to Palliative referral  3  Clinical Assessment  Psychosocial & Spiritual Assessment  Palliative Care Outcomes      Patient Active Problem List   Diagnosis Date Noted  . DNR (do not resuscitate)   . 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[place])   . Palliative care encounter   . Symptomatic anemia 03/29/2018  . Acute GI  bleeding 03/28/2018  . Dysphagia 10/01/2017  . Palliative care by specialist   . Severe comorbid illness   . Moderate malnutrition ([place]) 08/27/2017  . Hypoxemia   . Atelectasis   . Chronic respiratory failure with hypoxia ([place]) 08/25/2017  . Pulmonary artery hypertension ([place]) 07/25/2017  . Pressure injury of skin 07/20/2017  . Goals of care, counseling/discussion 02/11/2017  . Diabetic polyneuropathy associated with type 2 diabetes mellitus ([place]) 01/14/2017  . Pleural effusion   . Chronic anemia 02/28/2016  . Diabetic retinopathy ([place]) 11/13/2015  . Iron deficiency anemia 08/03/2015  . Type II diabetes mellitus with complication ([place])   . Chronic kidney disease (CKD), stage III (moderate) (HCC)   . 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 Deficiency anemia 01/15/2015  . CKD (chronic kidney disease) stage 4, GFR 15-29 ml/min (HCC) 01/15/2015  . Hypothyroidism 11/22/2014  . COPD GOLD III  02/16/2014  . Anemia of renal disease   . Chronic diastolic CHF (congestive heart failure) ([place])   . History of tobacco use 08/23/2013  . 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 CAD (coronary artery disease) 04/06/2013  . PVD - hx of Rt SFA PTA and s/p Rt 4-5th toe amp Dec 2014 04/05/2013  . 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 Mixed hyperlipidemia 07/17/2008    Palliative Care Plan    Recommendations/Plan:  The patient's biggest goal is to go home and live with his sister again.  (He was just about to be d/c'd from SNF)  Change to full code (family's request to postpone DNR for now)  Will order morphine SL for pain as it may absorb better than pills  Will order morphine IV for severe pain.  Will order senna QHS - given constipation and opioid  Will order out of bed to chair (prevent deconditioning)  Will request PT consult patient wants to keep strength up and work on standing, pivoting  Will request chaplain services.  Family requests that [name] be called with  updates from the Doctors.  Goals of Care and Additional Recommendations:  Limitations on Scope of Treatment: Full Scope Treatment  Code Status:  Full code  Prognosis:   Unable to determine   Discharge Planning:  To Be Determined  Care plan was discussed with HCPOA Leonard [name], Sister [name], Attending Team, [name].  Thank you for allowing the Palliative Medicine Team to assist in the care of this patient.  Total time spent:  120 min. Time in 9:00 Time out 11:00     Greater than 50%  of this time was spent counseling and coordinating care related to the above assessment and plan.  Florentina Jenny, PA-C Palliative Medicine  Please contact Palliative MedicineTeam phone at [phone] for questions and concerns between 7 am - 7 pm.   Please see AMION for individual provider pager numbers.

## 2018-04-01 NOTE — Progress Notes (Signed)
Family Medicine Teaching Service Daily Progress Note Intern Pager: [phone]  [name] Medical record number: [id number] Date of birth: November 19, 1950 Age: 68 y.o. Gender: male  Primary Care Provider: Kathrene Alu, MD Consultants: meical oncology, GI, Palliative and Gen surgery  Code Status: Full Code  Pt Overview and Major Events to Date:  Admitted: 03/28/2018 for CC: Abnormal Lab  Hospital Day: 5  Assessment and Plan: Leonard [name] is a 68 y.o. male admitted for multiple episodes of hematemesis and significant anemia.  Patient diagnosed with gastric adenocarcinoma after EGD biopsy on 2/24. His chronic conditions include CAD status post CABG, peripheral vascular disease status post left AKA, diabetes, diastolic CHF, hypertension.   #Gastic adenocarcinoma  Oncology on board. CT chest to complete staging obtained and appears to have no mets. Med Onc would like surgery to weigh in. Appreciate palliative recommendations. Patient would like to continue with full scope treatment.  Palliative recs:   Ativan 0.25 mg as needed insomnia and morphine PRN mod-severe pain.   Per oncology, he is poor candidate for chemotherapy given pancytopenia  Per nephrology, marginal candidate for hemodialysis prior to cancer  Surgery consulted, to see patient today   #Acute GI Bleeding in setting of new Gastric Adenocarcinoma  Status post 4 units packed red blood cells.  Transfusion goal is greater than 8.  Hemoglobin this morning is 7.7. GI signed off yesterday.   Transfusion goal >8.0  Transfuse 1 unit this morning with posttransfusion H&H  Start BID 40mg  PO PTX today  #HTN + #Diastolic HF + #Hx of infrarenal aneurysm  Continued to have hypertensive episodes overnight.  5 mg amlodipine added on. Patient with history of difficult to treat HF exacerbations, consider restarting torsemide sooner than later if changes in volume status. Euvolemic on exam today   Continue  carvedilol, clonidine patch, hydralazine. Restarted amlodipine 5 mg daily overnight. Restart doxazosin 8 mg first dose tonight  Watch volume status closely   #Consitpation  Mirilax daily   Senna QHS PRN    #CKD IV w/ ACD  Multifactorial in setting of CKD stage IV.  Monthly Aranesp injections. Iron studies yesterday with iron 61, TIBC 230, ferritin 816. Follows with  Dr. Janace Litten  - not currently following actively during this hospitalization.   Holding iron supplements  #CAD status post CABG  Holding ASA 81 in setting of GI bleed  Restart atorvastatin  #Diabetes   Continue sSSI   #Thrombocytopenia, chronic   Continue to monitor in setting of GI bleed and transfusions  Hypothyroidism  Continue 100 mcg Synthroid  #Insomnia   Ativan 0.25mg  PRN   #FEN/GI:   . Fluids: none  . Electrolytes: wnl   . Nutrition: Soft  Access: Right PIV (2/23) VTE prophylaxis: Holding in the setting of GI bleed, SCD on right lower extremity  Disposition: Likely home w/ sister, pending improvement in anemia and GI bleed and surgery consultation     Subjective:  NAEO.   Objective: BP (!) 158/71   Pulse 95   Temp 97.9 F (36.6 C) (Oral)   Resp 13   Ht 5\' 8"  (1.727 m)   Wt 78 kg   SpO2 100%   BMI 26.15 kg/m   Physical Exam: Gen: NAD, alert, non-toxic, lying comfortably in bed. Skin: Warm and dry.  HEENT: NCAT.  MMM.  CV: RRR.  Normal S1-S2. No BLEE. Resp: CTAB. No increased WOB Abd: NTND. Pos bowel sounds Extremities: Warm and well perfused, left LE AKA  Laboratory: I  have personally read and reviewed all labs and imaging studies.   CBC: Recent Labs  Lab Apr 01, 2018 1758 03/29/18 0500 03/30/18 1008 03/30/18 2139 03/31/18 0335 04/01/18 0528  WBC 6.7 5.9 4.6  --  5.6 5.8  NEUTROABS 5.3  --   --   --   --   --   HGB 5.9* 8.2* 6.8* 8.0* 8.0* 7.6*  HCT 20.0* 25.9* 21.7* 24.2* 24.7* 24.1*  MCV 90.9 87.2 87.5  --  85.5 86.4  PLT 93* 88* 97*  --  92* 92*   Basic  Metabolic Panel: Recent Labs  Lab Apr 01, 2018 1758 03/29/18 0500 03/31/18 0335 04/01/18 0528  NA 137 139 139 140  K 5.3* 5.2* 4.1 4.3  CL 113* 114* 115* 117*  CO2 16* 16* 15* 17*  GLUCOSE 187* 166* 159* 146*  BUN 110* 106* 102* 70*  CREATININE 3.63* 3.57* 2.97* 2.49*  CALCIUM 8.3* 8.8* 8.3* 8.3*    Imaging:  CT Chest 2/26 IMPRESSION: 1. A posterior left upper lobe 6 mm nodule is indeterminate, but felt to be similar on 07/11/2017 (both prior chest CTs are motion degraded). This could be re-evaluated at follow-up. 2. Otherwise, no evidence of metastatic disease in the chest. 3.  Aortic Atherosclerosis (ICD10-I70.0). 4. Bilateral gynecomastia. 5. Resolved bilateral pleural fluid and airspace disease since 08/26/2017. 6. Pulmonary artery enlargement suggests pulmonary arterial hypertension. 7. Right lateral chest wall lesion is similar to on the prior. Consider physical exam correlation.  Wilber Oliphant, MD 04/01/2018, 8:06 AM PGY-1, [place] Intern pager: [phone], text pages welcome

## 2018-04-01 NOTE — Progress Notes (Signed)
RN discussing with patient DNR status prior to placing DNR bracelet on pt, pt states "I want to be full code! I want them to try to save me!"  MD team made aware.

## 2018-04-01 NOTE — Evaluation (Signed)
Physical Therapy Evaluation Patient Details Name: Leonard [name] MRN: [id number] DOB: 1950/06/04 Today's Date: 04/01/2018   History of Present Illness  Pt is a 68 y/o male admitted from SNF secondary to acute GI bleed. Pt is s/p EGD which revealed gastric adenocarcinoma. Per notes, possibility for surgery, however, pending workup and clearance. PMH includes CAD s/p CABG, COPD, CKD, CHF, COPD, AAA, pulm HTN, and s/p L AKA.   Clinical Impression  Pt admitted secondary to problem above with deficits below. Pt requiring min-mod A +2 to stand using stedy this session. Pt reports he had been working with therapy at [facility] on transfers. Recommend return to SNF at d/c. Will continue to follow acutely to maximize functional mobility independence and safety.     Follow Up Recommendations SNF;Supervision for mobility/OOB    Equipment Recommendations  None recommended by PT    Recommendations for Other Services       Precautions / Restrictions Precautions Precautions: Fall;Other (comment) Precaution Comments: L AKA  Restrictions Weight Bearing Restrictions: No      Mobility  Bed Mobility               General bed mobility comments: Sitting in chair upon arrival.   Transfers Overall transfer level: Needs assistance   Transfers: Sit to/from Stand Sit to Stand: Min assist;Mod assist;+2 physical assistance         General transfer comment: Min-mod A +2 for lift assist to stand using standing. Performed ~30 sec standing trials X2. Pt with heavy reliance on UEs.   Ambulation/Gait                Stairs            Wheelchair Mobility    Modified Rankin (Stroke Patients Only)       Balance Overall balance assessment: Needs assistance Sitting-balance support: No upper extremity supported;Feet supported Sitting balance-Leahy Scale: Fair     Standing balance support: Bilateral upper extremity supported;During functional activity Standing balance-Leahy Scale:  Poor Standing balance comment: Reliant on UE and external support                              Pertinent Vitals/Pain Pain Assessment: No/denies pain    Home Living Family/patient expects to be discharged to:: Skilled nursing facility                      Prior Function Level of Independence: Needs assistance   Gait / Transfers Assistance Needed: Reports he was working with therapy, however, performed transfer WC without assist.   ADL's / Homemaking Assistance Needed: Reports he requires some assist for ADLs         Hand Dominance        Extremity/Trunk Assessment   Upper Extremity Assessment Upper Extremity Assessment: Generalized weakness    Lower Extremity Assessment Lower Extremity Assessment: LLE deficits/detail;Generalized weakness LLE Deficits / Details: L AKA at baseline     Cervical / Trunk Assessment Cervical / Trunk Assessment: Normal  Communication   Communication: No difficulties  Cognition Arousal/Alertness: Awake/alert Behavior During Therapy: WFL for tasks assessed/performed Overall Cognitive Status: Within Functional Limits for tasks assessed                                        General Comments General comments (skin integrity, edema, etc.): Pt's  niece present during session.     Exercises     Assessment/Plan    PT Assessment Patient needs continued PT services  PT Problem List Decreased strength;Decreased balance;Decreased mobility;Decreased knowledge of use of DME;Decreased knowledge of precautions       PT Treatment Interventions DME instruction;Functional mobility training;Therapeutic exercise;Therapeutic activities;Gait training;Balance training;Patient/family education    PT Goals (Current goals can be found in the Care Plan section)  Acute Rehab PT Goals Patient Stated Goal: to return to SNF to work with therapy  PT Goal Formulation: With patient Time For Goal Achievement: 04/15/18 Potential  to Achieve Goals: Good    Frequency Min 2X/week   Barriers to discharge        Co-evaluation               AM-PAC PT "6 Clicks" Mobility  Outcome Measure Help needed turning from your back to your side while in a flat bed without using bedrails?: A Little Help needed moving from lying on your back to sitting on the side of a flat bed without using bedrails?: A Little Help needed moving to and from a bed to a chair (including a wheelchair)?: A Lot Help needed standing up from a chair using your arms (e.g., wheelchair or bedside chair)?: A Lot Help needed to walk in hospital room?: Total Help needed climbing 3-5 steps with a railing? : Total 6 Click Score: 12    End of Session Equipment Utilized During Treatment: Gait belt Activity Tolerance: Patient tolerated treatment well Patient left: in chair;with call bell/phone within reach Nurse Communication: Mobility status PT Visit Diagnosis: Unsteadiness on feet (R26.81);Muscle weakness (generalized) (M62.81);Difficulty in walking, not elsewhere classified (R26.2)    Time: 7096-2836 PT Time Calculation (min) (ACUTE ONLY): 20 min   Charges:   PT Evaluation $PT Eval Moderate Complexity: [place], PT, DPT  Acute Rehabilitation Services  Pager: [phone] Office: [phone]   Rudean Hitt 04/01/2018, 5:19 PM

## 2018-04-01 NOTE — Progress Notes (Signed)
Patient occupied. Will pass on to unit chaplain to continue support .   Jaclynn Major, [employer], [facility], Pager [phone]

## 2018-04-01 NOTE — Consult Note (Addendum)
Reason for Consult: Gastric cancer Referring physician: Elayne Snare  Family practice Oncology: Dr. Heath Lark GI: Dr. Justice Britain  Leonard [name] is an 68 y.o. male.   HPI: Patient is a 68 year old male came patient transferred from a skilled nursing facility to the ED at [facility].  He presented to the ED with complaints of a low hemoglobin on 03/29/2018, he was admitted.  He has a history of anemia.  He complained of feeling weak on admission.  He complained of some epigastric pain, and also some episodes of hematemesis.  He also reports a nurse at the skilled nursing facility told him the stools were dark, prior to transfer to the ED.  He has a history of coronary artery disease prior, CABG, peripheral vascular disease with a left AKA, type 2 diabetes, diastolic congestive heart failure, stage IV chronic kidney disease, COPD Gold stage III, hypertension, monoclonal gammopathy of unknown significance, pulmonary hypertension, diabetic retinopathy, and hypothyroidism.  Work-up on admission showed a potassium of 5.2, chloride of 114, CO2 of 16, BUN of 106, creatinine at 3.57, LFTs were normal.  BNP 171, hemoglobin on admission was 5.9, hematocrit 20, platelets 93,000.  He was transfused with 4 units of packed cells.  He was seen by GI and Dr. Rush Landmark.  He was placed on a PPI and scheduled for EGD that day.    EGD revealed:No gross lesions in esophagus. - Non-bleeding erosive gastropathy. - Congested, erythematous, friable (with contact bleeding), granular, inflamed and texture changed mucosa in the prepyloric region of the stomach and pylorus. - Currently non-bleeding gastric ulcer with a possible flat pigmented spot (Forrest Class IIc) that encircled the entire pylorus. Biopsied. - No other gross lesions in the stomach. Biopsied for HP. - Erythematous duodenopathy and melanosis. - Normal mucosa was found in the second portion of the duodenum.  Pathology from the biopsies: 1.  Stomach, biopsy, Gastric - ADENOCARCINOMA. - HIGH GRADE GLANDULAR DYSPLASIA AND INTESTINAL METAPLASIA. - WARTHIN-STARRY STAIN NEGATIVE FOR HELICOBACTER PYLORI. 2. Stomach, biopsy - ANTRAL AND OXYNTIC MUCOSA WITH SLIGHT INFLAMMATION AND INTESTINAL METAPLASIA. - WARTHIN-STARRY STAIN NEGATIVE FOR HELICOBACTER PYLORI. - NO DYSPLASIA OR MALIGNANCY  Oncology evaluation by [name] is in process.  A CT the chest has been obtained as part of a metastatic work-up.  She plans to present the patient to the tumor board next week.  It was her opinion the patient is overall a poor chemotherapy candidate due to the multiple medical comorbidities and history of poor medical compliance.  She was concerned that with chemotherapy would most certainly develop end-stage renal disease.  She recommended a general surgery and palliative medicine consult.  He was seen by Palliative Care Medicine.  They were very concerned the patient and family were not understanding the severity of his condition and difficulty of any proposed therapy.  He was initially made a DNR but the family has subsequently rescinded that.  He is still having some occasional abdominal discomfort and is being transfused again today.  We are asked to see.   Past Medical History:  Diagnosis Date  . Acute on chronic diastolic heart failure ([place]) 07/04/2014  . Anemia of renal disease   . Atherosclerotic peripheral vascular disease with gangrene ([place]) 02/14/2017  . CAD (coronary artery disease) 04/06/2013  . Cancer ([place])    skin cancer-  . CHF (congestive heart failure) ([place])   . Chronic anemia 02/28/2016  . Chronic deep vein thrombosis (DVT) of femoral vein of left lower extremity (  [place]) 09/03/2016  . Chronic respiratory failure with hypoxia ([place]) 08/25/2017  . CKD (chronic kidney disease) stage 4, GFR 15-29 ml/min (HCC) 01/15/2015   Follows with [facility].  Last visits:  07/23/16: No change in meds. Worried that if we increase diuretics could lead  to another AKI  . CKD (chronic kidney disease), stage IV ([place])    07/07/2017  . Claudication ([place])   . Collapse of left talus 01/14/2017  . COPD GOLD III  02/16/2014   Quit smoking 2014  - PFT's  04/06/13   FEV1 1.28 (44 % ) ratio 48  p no % improvement from saba p ?  prior to study with DLCO  43/54 % corrects to 97  % for alv volume - 01/31/2016  After extensive coaching HFA effectiveness =    75% from baseline 0 > changed to sprivia respimat   - PFT's  03/17/2016  FEV1 2.25  (81 % ) ratio 88  p 2 % improvement from saba p spiriva respimat x 2  prior to study with  . Coronary artery disease   . Diabetes ([place])   . Diabetic polyneuropathy associated with type 2 diabetes mellitus ([place]) 01/14/2017  . Diabetic retinopathy ([place]) 11/13/2015   Per Dr. Erenest Rasher eye exam in 10/17/15, mild background diabetic retinopathy  . Fatigue associated with anemia 01/21/2016  . Gangrene of toe ([place])    right 5th/notes 01/04/2013   . HCAP (healthcare-associated pneumonia)   . Hematuria 06/27/2015  . High cholesterol   . History of tobacco use 08/23/2013   Smoked pack per day for 46 years Quit smoking - 02/03/2013   . Hypertension   . Hypothyroidism 11/22/2014  . Idiopathic chronic gout of left foot without tophus   . Iron deficiency anemia 08/03/2015  . Left foot pain 10/29/2016  . Long term (current) use of anticoagulants [Z79.01] 09/09/2016  . 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 Mixed hyperlipidemia 07/17/2008  . Morbid obesity due to excess calories ([place]) 03/17/2016  . PAD (peripheral artery disease) ([place])   . Pleural effusion   . Pneumonitis 07/26/2017  . Pulmonary artery hypertension ([place]) 07/25/2017  . PVD - hx of Rt SFA PTA and s/p Rt 4-5th toe amp Dec 2014 04/05/2013  . 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 Respiratory failure ([place]) 08/13/2017  . Respiratory failure with hypoxia ([place]) 09/23/2017  . 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 Secondary hypertension   . Shortness of breath dyspnea    with  exertion  . Skin lesion of face 02/22/2015  . SOB (shortness of breath)   . Type 2 diabetes mellitus with pressure callus ([place]) 12/29/2016  . Type II diabetes mellitus (HCC)    Type II  . Type II diabetes mellitus with complication [facility])     Past Surgical History:  Procedure Laterality Date  . ABDOMINAL AORTOGRAM W/LOWER EXTREMITY N/A 03/12/2017   Procedure: ABDOMINAL AORTOGRAM W/LOWER EXTREMITY;  Surgeon: Conrad [place], MD;  Location: [facility];  Service: Cardiovascular;  Laterality: N/A;  . AMPUTATION Right 01/28/2013   Procedure: RAY AMPUTATION RIGHT  4th & 5th TOE;  Surgeon: Rosetta Posner, MD;  Location: [facility];  Service: Vascular;  Laterality: Right;  . AMPUTATION Left 03/15/2017   Procedure: AMPUTATION ABOVE KNEE;  Surgeon: Waynetta Sandy, MD;  Location: [facility];  Service: Vascular;  Laterality: Left;  . ANGIOPLASTY / STENTING FEMORAL Right 01/13/2013   superficial femoral artery x 2 (3 mm x 60 mm, 4 mm  x 60 mm)  . AV FISTULA PLACEMENT Left 02/11/2016   Procedure: LEFT UPPER ARM BRACHIAL CEPHALIC ARTERIOVENOUS (AV) FISTULA CREATION;  Surgeon: Conrad Stratton, MD;  Location: [facility];  Service: Vascular;  Laterality: Left;  . BIOPSY  03/29/2018   Procedure: BIOPSY;  Surgeon: Rush Landmark Telford Nab., MD;  Location: [facility];  Service: Gastroenterology;;  . COLONOSCOPY W/ POLYPECTOMY    . CORONARY ARTERY BYPASS GRAFT N/A 04/07/2013   Procedure: CORONARY ARTERY BYPASS GRAFTING (CABG);  Surgeon: Ivin Poot, MD;  Location: [facility];  Service: Open Heart Surgery;  Laterality: N/A;  . ESOPHAGOGASTRODUODENOSCOPY (EGD) WITH PROPOFOL N/A 03/29/2018   Procedure: ESOPHAGOGASTRODUODENOSCOPY (EGD) WITH PROPOFOL;  Surgeon: Rush Landmark Telford Nab., MD;  Location: [facility];  Service: Gastroenterology;  Laterality: N/A;  . INTRAOPERATIVE TRANSESOPHAGEAL ECHOCARDIOGRAM N/A 04/07/2013   Procedure: INTRAOPERATIVE TRANSESOPHAGEAL ECHOCARDIOGRAM;  Surgeon: Ivin Poot, MD;  Location: [facility];   Service: Open Heart Surgery;  Laterality: N/A;  . IR FLUORO GUIDE CV LINE RIGHT  11/04/2017  . IR REMOVAL TUN CV CATH W/O FL  11/27/2017  . IR US GUIDE VASC ACCESS RIGHT  11/04/2017  . LEFT HEART CATHETERIZATION WITH CORONARY ANGIOGRAM N/A 04/05/2013   Procedure: LEFT HEART CATHETERIZATION WITH CORONARY ANGIOGRAM;  Surgeon: Peter M Martinique, MD;  Location: [facility] CATH LAB;  Service: Cardiovascular;  Laterality: N/A;  . LOWER EXTREMITY ANGIOGRAM Bilateral 01/06/2013   Procedure: LOWER EXTREMITY ANGIOGRAM;  Surgeon: Conrad [place], MD;  Location: [facility] CATH LAB;  Service: Cardiovascular;  Laterality: Bilateral;  . LOWER EXTREMITY ANGIOGRAM Right 01/13/2013   Procedure: LOWER EXTREMITY ANGIOGRAM;  Surgeon: Conrad [place], MD;  Location: [facility] CATH LAB;  Service: Cardiovascular;  Laterality: Right;  . SKIN CANCER EXCISION  2017  . THROAT SURGERY  1983   polyps  . TONSILLECTOMY AND ADENOIDECTOMY  ~ 1965    Family History  Problem Relation Age of Onset  . Cancer Mother        lung cancer  . Hypertension Mother   . Cancer Sister        patient thinks it was uterine cancer  . Hypertension Sister   . Heart attack Sister   . Stroke Neg Hx     Social History:  reports that he quit smoking about 5 years ago. His smoking use included cigarettes. He started smoking about 51 years ago. He has a 46.00 pack-year smoking history. He has never used smokeless tobacco. He reports that he does not drink alcohol or use drugs.  Allergies:  Allergies  Allergen Reactions  . Penicillins Other (See Comments)    Has patient had a PCN reaction causing immediate rash, facial/tongue/throat swelling, SOB or lightheadedness with hypotension: Unknown Has patient had a PCN reaction causing severe rash involving mucus membranes or skin necrosis: Unknown Has patient had a PCN reaction that required hospitalization: Unknown Has patient had a PCN reaction occurring within the last 10 years: Unknown If all of the above answers are "NO",  then may proceed with Cephalosporin use.     Medications:  Prior to Admission:  Medications Prior to Admission  Medication Sig Dispense Refill Last Dose  . acetaminophen (TYLENOL) 325 MG tablet Take 650 mg by mouth every 8 (eight) hours as needed for mild pain or fever.    UNK  . aspirin EC 81 MG tablet Take 1 tablet (81 mg total) by mouth daily. 150 tablet 0 03/28/2018 at Unknown time  . atorvastatin (LIPITOR) 40 MG tablet TAKE ONE (1) TABLET BY MOUTH  EVERY DAY AT 6:00PM (Patient taking differently: Take 40 mg by mouth daily at 6 PM. ) 90 tablet 3 Past Week at Unknown time  . Blood Glucose Monitoring Suppl (ONETOUCH VERIO FLEX SYSTEM) w/Device KIT 1 kit by Does not apply route daily. 1 kit 3 UNK  . carvedilol (COREG) 12.5 MG tablet Take 1 tablet (12.5 mg total) by mouth 2 (two) times daily with a meal. 60 tablet 0 03/28/2018 at 0800  . cloNIDine (CATAPRES - DOSED IN MG/24 HR) 0.2 mg/24hr patch Place 1 patch (0.2 mg total) onto the skin once a week. (Patient taking differently: Place 0.2 mg onto the skin every Saturday. ) 4 patch 0 03/27/2018  . Darbepoetin Alfa (ARANESP) 40 MCG/0.4ML SOSY injection Inject 0.4 mLs (40 mcg total) into the skin every 30 (thirty) days. 8.4 mL 2 03/11/2018  . doxazosin (CARDURA) 8 MG tablet Take 1 tablet (8 mg total) by mouth every evening.   Past Week at Unknown time  . feeding supplement, ENSURE ENLIVE, (ENSURE ENLIVE) LIQD Take 237 mLs by mouth 3 (three) times daily between meals. (Patient taking differently: Take 237 mLs by mouth 2 (two) times daily between meals. ) 237 mL 12 03/28/2018 at Unknown time  . ferrous sulfate (FEROSUL) 325 (65 FE) MG tablet Take 1 tablet (325 mg total) by mouth daily at 12 noon. (Patient taking differently: Take 325 mg by mouth 2 (two) times daily with a meal. ) 30 tablet 0 03/28/2018 at Unknown time  . glucose blood (ONETOUCH VERIO) test strip Use as instructed to test three times daily. ICD-10 code: E11.22 100 each 12 UNK  . hydrALAZINE  (APRESOLINE) 100 MG tablet TAKE ONE TABLET BY MOUTH EVERY EIGHT HOURS (Patient taking differently: Take 100 mg by mouth every 8 (eight) hours. ) 90 tablet 1 03/28/2018 at Unknown time  . insulin aspart (NOVOLOG) 100 UNIT/ML injection Inject 0-10 Units into the skin See admin instructions. Three times daily per sliding scale: <150 = 0 units 151-200 = 2 units 201-250 = 4 units 251-300 = 6 units 301-350 = 8 units 351-400 = 10 units Call PCP if CBG <70 or >400   03/28/2018 at Unknown time  . ipratropium-albuterol (DUONEB) 0.5-2.5 (3) MG/3ML SOLN Take 3 mLs by nebulization every 6 (six) hours as needed (shortness of breath and wheezing).    UNK  . isosorbide mononitrate (IMDUR) 120 MG 24 hr tablet Take 1 tablet (120 mg total) by mouth daily. 30 tablet 0 03/28/2018 at Unknown time  . levothyroxine (SYNTHROID, LEVOTHROID) 100 MCG tablet Take 100 mcg by mouth daily before breakfast.    03/28/2018 at Unknown time  . LONHALA MAGNAIR STARTER KIT 25 MCG/ML SOLN Inhale 1 mL into the lungs 2 (two) times daily.   03/28/2018 at Unknown time  . nitroGLYCERIN (NITROSTAT) 0.4 MG SL tablet Place 1 tablet (0.4 mg total) under the tongue every 5 (five) minutes as needed for chest pain. 30 tablet 12 UNK  . omeprazole (PRILOSEC) 20 MG capsule Take 20 mg by mouth daily.    03/28/2018 at Unknown time  . ONETOUCH DELICA LANCETS 78L MISC 1 each by Does not apply route 3 (three) times daily. ICD-10 code: E11.22 100 each 12 UNK  . polyethylene glycol (MIRALAX / GLYCOLAX) packet Take 17 g by mouth daily as needed for mild constipation or moderate constipation. (Patient taking differently: Take 17 g by mouth daily as needed for mild constipation. ) 14 each 0 UNK  . potassium chloride SA (K-DUR,KLOR-CON) 20 MEQ tablet  Take 2 tablets (40 mEq total) by mouth daily. 60 tablet 11 03/28/2018 at Unknown time  . torsemide (DEMADEX) 20 MG tablet Take 2 tablets (40 mg total) by mouth daily. (Patient taking differently: Take 80 mg by mouth daily.  ) 120 tablet 11 03/28/2018 at Unknown time   Scheduled: . amLODipine  5 mg Oral Daily  . atorvastatin  40 mg Oral q1800  . carvedilol  12.5 mg Oral BID WC  . cloNIDine  0.2 mg Transdermal Q Mon  . doxazosin  8 mg Oral QPM  . hydrALAZINE  100 mg Oral Q8H  . insulin aspart  0-9 Units Subcutaneous Q4H  . levothyroxine  100 mcg Oral Q0600  . pantoprazole  40 mg Oral BID  . polyethylene glycol  17 g Oral Daily   Continuous:  Anti-infectives (From admission, onward)   Start     Dose/Rate Route Frequency Ordered Stop   03/29/18 2200  ceFEPIme (MAXIPIME) 1 g in sodium chloride 0.9 % 100 mL IVPB  Status:  Discontinued     1 g 200 mL/hr over 30 Minutes Intravenous Every 24 hours 03/28/18 2106 03/29/18 0109   03/28/18 2115  ceFEPIme (MAXIPIME) 2 g in sodium chloride 0.9 % 100 mL IVPB     2 g 200 mL/hr over 30 Minutes Intravenous  Once 03/28/18 2106 03/29/18 0015   03/28/18 2100  metroNIDAZOLE (FLAGYL) IVPB 500 mg  Status:  Discontinued     500 mg 100 mL/hr over 60 Minutes Intravenous  Once 03/28/18 2059 03/29/18 0403      Results for orders placed or performed during the hospital encounter of 03/28/18 (from the past 48 hour(s))  Glucose, capillary     Status: Abnormal   Collection Time: 03/30/18 11:43 AM  Result Value Ref Range   Glucose-Capillary 169 (H) 70 - 99 mg/dL  Glucose, capillary     Status: Abnormal   Collection Time: 03/30/18  4:20 PM  Result Value Ref Range   Glucose-Capillary 184 (H) 70 - 99 mg/dL  Glucose, capillary     Status: Abnormal   Collection Time: 03/30/18  8:42 PM  Result Value Ref Range   Glucose-Capillary 122 (H) 70 - 99 mg/dL  Hemoglobin and hematocrit, blood     Status: Abnormal   Collection Time: 03/30/18  9:39 PM  Result Value Ref Range   Hemoglobin 8.0 (L) 13.0 - 17.0 g/dL   HCT 24.2 (L) 39.0 - 52.0 %    Comment: Performed at [facility], [address], [place], [place] [address]  Glucose, capillary     Status: Abnormal   Collection Time:  03/31/18 12:23 AM  Result Value Ref Range   Glucose-Capillary 160 (H) 70 - 99 mg/dL  CBC     Status: Abnormal   Collection Time: 03/31/18  3:35 AM  Result Value Ref Range   WBC 5.6 4.0 - 10.5 K/uL   RBC 2.89 (L) 4.22 - 5.81 MIL/uL   Hemoglobin 8.0 (L) 13.0 - 17.0 g/dL   HCT 24.7 (L) 39.0 - 52.0 %   MCV 85.5 80.0 - 100.0 fL   MCH 27.7 26.0 - 34.0 pg   MCHC 32.4 30.0 - 36.0 g/dL   RDW 15.5 11.5 - 15.5 %   Platelets 92 (L) 150 - 400 K/uL    Comment: REPEATED TO VERIFY Immature Platelet Fraction may be clinically indicated, consider ordering this additional test [id number] CONSISTENT WITH PREVIOUS RESULT    nRBC 0.0 0.0 - 0.2 %  Comment: Performed at [facility], [place] [address], [place], [place] [address]  Basic metabolic panel     Status: Abnormal   Collection Time: 03/31/18  3:35 AM  Result Value Ref Range   Sodium 139 135 - 145 mmol/L   Potassium 4.1 3.5 - 5.1 mmol/L    Comment: DELTA CHECK NOTED   Chloride 115 (H) 98 - 111 mmol/L   CO2 15 (L) 22 - 32 mmol/L   Glucose, Bld 159 (H) 70 - 99 mg/dL   BUN 102 (H) 8 - 23 mg/dL   Creatinine, Ser 2.97 (H) 0.61 - 1.24 mg/dL   Calcium 8.3 (L) 8.9 - 10.3 mg/dL   GFR calc non Af Amer 21 (L) >60 mL/min   GFR calc Af Amer 24 (L) >60 mL/min   Anion gap 9 5 - 15    Comment: Performed at [facility] [address] [place], [place] [address]  Glucose, capillary     Status: Abnormal   Collection Time: 03/31/18  5:06 AM  Result Value Ref Range   Glucose-Capillary 156 (H) 70 - 99 mg/dL  Glucose, capillary     Status: Abnormal   Collection Time: 03/31/18  7:36 AM  Result Value Ref Range   Glucose-Capillary 133 (H) 70 - 99 mg/dL  Ferritin     Status: Abnormal   Collection Time: 03/31/18  9:03 AM  Result Value Ref Range   Ferritin 816 (H) 24 - 336 ng/mL    Comment: Performed at [facility], [place] [address] [place], [place] [address]  Iron and TIBC     Status: Abnormal   Collection Time: 03/31/18  9:03 AM  Result  Value Ref Range   Iron 61 45 - 182 ug/dL   TIBC 230 (L) 250 - 450 ug/dL   Saturation Ratios 27 17.9 - 39.5 %   UIBC 169 ug/dL    Comment: Performed at [facility], [place] [address], [place], [place] [address]  Glucose, capillary     Status: Abnormal   Collection Time: 03/31/18 11:38 AM  Result Value Ref Range   Glucose-Capillary 156 (H) 70 - 99 mg/dL  Glucose, capillary     Status: Abnormal   Collection Time: 03/31/18  4:08 PM  Result Value Ref Range   Glucose-Capillary 208 (H) 70 - 99 mg/dL  Glucose, capillary     Status: Abnormal   Collection Time: 03/31/18  7:30 PM  Result Value Ref Range   Glucose-Capillary 132 (H) 70 - 99 mg/dL  Glucose, capillary     Status: Abnormal   Collection Time: 04/01/18  1:14 AM  Result Value Ref Range   Glucose-Capillary 177 (H) 70 - 99 mg/dL  Glucose, capillary     Status: Abnormal   Collection Time: 04/01/18  4:05 AM  Result Value Ref Range   Glucose-Capillary 134 (H) 70 - 99 mg/dL  CBC     Status: Abnormal   Collection Time: 04/01/18  5:28 AM  Result Value Ref Range   WBC 5.8 4.0 - 10.5 K/uL   RBC 2.79 (L) 4.22 - 5.81 MIL/uL   Hemoglobin 7.6 (L) 13.0 - 17.0 g/dL   HCT 24.1 (L) 39.0 - 52.0 %   MCV 86.4 80.0 - 100.0 fL   MCH 27.2 26.0 - 34.0 pg   MCHC 31.5 30.0 - 36.0 g/dL   RDW 15.6 (H) 11.5 - 15.5 %   Platelets 92 (L) 150 - 400 K/uL    Comment: REPEATED  TO VERIFY Immature Platelet Fraction may be clinically indicated, consider ordering this additional test [id number] CONSISTENT WITH PREVIOUS RESULT    nRBC 0.0 0.0 - 0.2 %    Comment: Performed at [facility], [place] [address] [place], [place] [address]  Basic metabolic panel     Status: Abnormal   Collection Time: 04/01/18  5:28 AM  Result Value Ref Range   Sodium 140 135 - 145 mmol/L   Potassium 4.3 3.5 - 5.1 mmol/L   Chloride 117 (H) 98 - 111 mmol/L   CO2 17 (L) 22 - 32 mmol/L   Glucose, Bld 146 (H) 70 - 99 mg/dL   BUN 70 (H) 8 - 23 mg/dL   Creatinine, Ser 2.49 (H)  0.61 - 1.24 mg/dL   Calcium 8.3 (L) 8.9 - 10.3 mg/dL   GFR calc non Af Amer 26 (L) >60 mL/min   GFR calc Af Amer 30 (L) >60 mL/min   Anion gap 6 5 - 15    Comment: Performed at [facility] [address] [place], [place] [address]  Glucose, capillary     Status: Abnormal   Collection Time: 04/01/18  7:45 AM  Result Value Ref Range   Glucose-Capillary 142 (H) 70 - 99 mg/dL  Prepare RBC     Status: None   Collection Time: 04/01/18  9:12 AM  Result Value Ref Range   Order Confirmation      ORDER PROCESSED BY BLOOD BANK Performed at [facility], [place] [address] [place], [place] [address]   Type and screen [place]     Status: None (Preliminary result)   Collection Time: 04/01/18  9:30 AM  Result Value Ref Range   ABO/RH(D) B NEG    Antibody Screen NEG    Sample Expiration      04/04/2018 Performed at [facility], [place] [address] [place],  [address]    Unit Number [id number]    Blood Component Type RED CELLS,LR    Unit division 00    Status of Unit ALLOCATED    Transfusion Status OK TO TRANSFUSE    Crossmatch Result Compatible   Glucose, capillary     Status: Abnormal   Collection Time: 04/01/18 11:28 AM  Result Value Ref Range   Glucose-Capillary 233 (H) 70 - 99 mg/dL   *Note: Due to a large number of results and/or encounters for the requested time period, some results have not been displayed. A complete set of results can be found in Results Review.    Ct Chest Wo Contrast  Result Date: 03/31/2018 CLINICAL DATA:  Gastric cancer. Staging. Asymptomatic. COPD. Ex-smoker, quitting 5 years ago. EXAM: CT CHEST WITHOUT CONTRAST TECHNIQUE: Multidetector CT imaging of the chest was performed following the standard protocol without IV contrast. COMPARISON:  Plain film chest 03/28/2018. Most recent chest CT 08/26/2017. FINDINGS: Cardiovascular: Advanced aortic and branch vessel atherosclerosis. Mild cardiomegaly, without pericardial  effusion. Native coronary artery calcification, status post median sternotomy for CABG. Pulmonary artery enlargement, outflow tract 3.2 cm. Mediastinum/Nodes: No mediastinal or definite hilar adenopathy, given limitations of unenhanced CT. Lungs/Pleura: Resolved pleural fluid since the prior CT. Minimal motion degradation inferiorly. Left upper lobe 6 mm pulmonary nodule on image 43/4. The most recent exam was motion degraded. On a mildly motion degraded exam of 07/11/2017, this felt to be similar (image 32 of that exam). Upper Abdomen: Normal imaged portions of the liver, spleen, stomach, pancreas, gallbladder, adrenal glands, right kidney.  Musculoskeletal: Marked bilateral gynecomastia. A right chest wall skin lesion of 9 mm on image 11/3 is similar on 08/26/17. No acute osseous abnormality. IMPRESSION: 1. A posterior left upper lobe 6 mm nodule is indeterminate, but felt to be similar on 07/11/2017 (both prior chest CTs are motion degraded). This could be re-evaluated at follow-up. 2. Otherwise, no evidence of metastatic disease in the chest. 3.  Aortic Atherosclerosis (ICD10-I70.0). 4. Bilateral gynecomastia. 5. Resolved bilateral pleural fluid and airspace disease since 08/26/2017. 6. Pulmonary artery enlargement suggests pulmonary arterial hypertension. 7. Right lateral chest wall lesion is similar to on the prior. Consider physical exam correlation. Electronically Signed   By: Abigail Miyamoto M.D.   On: 03/31/2018 14:42    Review of Systems  Constitutional: Negative.   HENT: Negative.   Eyes: Negative.   Respiratory: Positive for cough and wheezing.        He says he has some cough that is productive and some wheezing at home.  He is not on oxygen at home.  Cardiovascular: Positive for leg swelling.  Gastrointestinal: Positive for abdominal pain, blood in stool (He is not sure about blood but he said the nurse at the skilled nursing facility said it was very dark.), nausea (Nausea vomiting and  abdominal pain all started Sunday last week.) and vomiting. Negative for constipation and diarrhea.  Genitourinary: Negative.   Musculoskeletal:       Limited mobility mostly from bed to the wheelchair.  He does not have a prosthesis.  Skin: Negative.   Endo/Heme/Allergies: Negative.   Psychiatric/Behavioral: Negative.    Blood pressure (!) 179/72, pulse 89, temperature 97.9 F (36.6 C), temperature source Oral, resp. rate 12, height '5\' 8"'$  (1.727 m), weight 78 kg, SpO2 100 %. Physical Exam  Constitutional: He is oriented to person, place, and time.  Chronically ill-appearing man in no distress.  Comfortable up in the chair and watching TV.  HENT:  Head: Normocephalic and atraumatic.  Mouth/Throat: Oropharynx is clear and moist. No oropharyngeal exudate.  Eyes: Right eye exhibits no discharge. Left eye exhibits no discharge. No scleral icterus.  Pupils are equal  Neck: Normal range of motion. Neck supple. No JVD present. No tracheal deviation present. No thyromegaly present.  Cardiovascular: Normal rate, regular rhythm and intact distal pulses. Exam reveals gallop.  Respiratory: Effort normal and breath sounds normal. No respiratory distress. He has no wheezes. He has no rales. He exhibits no tenderness.  GI: Soft. Bowel sounds are normal. He exhibits no distension and no mass. There is no abdominal tenderness. There is no rebound and no guarding.  Musculoskeletal: Normal range of motion.        General: Edema (trace RLE) present.  Lymphadenopathy:    He has no cervical adenopathy.  Neurological: He is alert and oriented to person, place, and time. A cranial nerve deficit is present.  Skin: Skin is warm and dry. No rash noted. No erythema. No pallor.  Psychiatric: He has a normal mood and affect. His behavior is normal. Judgment and thought content normal.   Echocardiogram 09/24/2017: EF 50-2 5%, moderate to severe LVH, mild MR moderate LA dilatation CT of the chest 03/31/2018: Left  posterior 6 mm nodule indeterminate similar to prior CTs.  No other evidence of metastatic disease, aortic atherosclerosis, bilateral gynecomastia.  Resolved bilateral pleural effusions pulmonary artery enlargement suggestive of atrial hypertension right lateral 9 mm chest wall lesion, similar to 08/26/2017. Hemoglobin:: 5.9>> 8.2>> 6.8>>8.0>> 7.6 this a.m. he is receiving fifth unit of  packed red cells since admission today. He is currently on a regular soft diet.    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 Sabino Snipes. Barry Dienes.  Patient has multiple medical comorbidities.  If lesion was deemed resectable, he would need to undergo medical and cardiac clearance.  We will add pre-albumin to AM labs.  Patient was just transfused this afternoon.  A unit PRBC's is currently finishing now so he still bleeding some.    Torrence Branagan 04/01/2018, 11:42 AM

## 2018-04-01 NOTE — Discharge Summary (Signed)
[facility] Discharge Summary  [name] Medical record number: [id number] Date of birth: 1950-08-02 Age: 68 y.o. Gender: male Date of Admission: 03/28/2018  Date of Discharge: 03/14/2018 Admitting Physician: Rise Patience, MD  Primary Care Provider: Kathrene Alu, MD Consultants: Surgery, Palliative Care, Case Management, Spiritual Care, Med Oncology, GI  Indication for Hospitalization: Acute GI bleed  Discharge Diagnoses/Problem List:   Gastric adenocarcinoma Symptomatic anemia CAD s/p CABG x3 Chronic diastolic CHF COPD Gold 3 CKD stage IV Peripheral vascular disease s/p left AKA Type 2 diabetes mellitus Thyroidism MGUS  Disposition: [place]   Discharge Condition: Stable   Discharge Exam:  Gen: NAD, resting in bed Cardio: RRR, S1-S2 present, no murmurs, rubs, gallops Pulmo-: CTA bilaterally no apparent distress Abdomen: Soft, nontender, bowel sounds in 4 quadrants Extremities: Left AKA, no evidence of edema or DVT in right lower extremity  Brief Hospital Course:  The patient presented to the emergency department from skilled nursing facility with history of feeling weak, episodic gastric discomfort, history of 3 recent episodes of hematemesis, and lab revealed decreasing hemoglobin to 5.9.  GI on-call was consulted and placed on Protonix infusion.  He was transfused 4 units of PRBCs total.  EGD was performed 2/24 and  revealed gastric adenocarcinoma.  Medical oncology was consulted and ordered CT chest/abdomen to complete staging work-up, which appeared to have no mets.  It was decided he was a poor chemotherapy candidate due to multiple medical comorbidities and poor medical compliance. Oncology recommended palliative care consult for goals of care clarification and recommended surgical intervention as patient did not have evidence of metastasis.  Surgery was consulted and agreed to further  evaluation for operative management; however, patient would need to undergo medical and cardiac clearance, which he could obtain as outpatient.  Patient's hemoglobin remained stable and he was discharged back to [facility] on 04/03/18.   Issues for Follow Up:  1. Follow up with Surgery outpatient for further tumor evaluation and will need pre-op clearance  2. Patient to continue with PO Protonix 40 mg.  3. Resume ASA 81 on 04/10/18 as GIB precaution   Significant Procedures:  03/29/2018 - EGD Impression: - No gross lesions in esophagus. - Non-bleeding erosive gastropathy. - Congested, erythematous, friable (with contact bleeding), granular, inflamed and texture changed mucosa in the prepyloric region of the stomach and pylorus. - Currently non-bleeding gastric ulcer with a possible flat pigmented spot (Forrest Class IIc) that encircled the entire pylorus. Biopsied. - No other gross lesions in the stomach. Biopsied for HP. - Erythematous duodenopathy and melanosis. - Normal mucosa was found in the second portion of the duodenum.   Significant Labs and Imaging:  Recent Labs  Lab 03/31/18 0335 04/01/18 0528 04/01/18 1813 04/02/18 0458  WBC 5.6 5.8  --  5.2  HGB 8.0* 7.6* 8.9* 8.8*  HCT 24.7* 24.1* 27.8* 26.4*  PLT 92* 92*  --  100*   Recent Labs  Lab 03/28/18 1758 03/29/18 0500 03/31/18 0335 04/01/18 0528 04/02/18 0458  NA 137 139 139 140 141  K 5.3* 5.2* 4.1 4.3 4.2  CL 113* 114* 115* 117* 115*  CO2 16* 16* 15* 17* 18*  GLUCOSE 187* 166* 159* 146* 131*  BUN 110* 106* 102* 70* 51*  CREATININE 3.63* 3.57* 2.97* 2.49* 2.54*  CALCIUM 8.3* 8.8* 8.3* 8.3* 8.1*  ALKPHOS 72 74  --   --   --   AST 11* 12*  --   --   --  ALT 11 12  --   --   --   ALBUMIN 3.0* 3.1*  --   --   --    Ct Abdomen Pelvis Wo Contrast  Result Date: 03/28/2018 CLINICAL DATA:  Nausea, vomiting EXAM: CT ABDOMEN AND PELVIS WITHOUT CONTRAST TECHNIQUE: Multidetector CT imaging of the abdomen and pelvis was  performed following the standard protocol without IV contrast. COMPARISON:  None. FINDINGS: Lower chest: No confluent opacities or effusions. Hepatobiliary: No focal hepatic abnormality. Gallbladder unremarkable. Pancreas: No focal abnormality or ductal dilatation. Spleen: Calcifications in the spleen.  Normal size. Adrenals/Urinary Tract: Renovascular calcifications. No urinary tract stones or hydronephrosis. Exophytic low-density lesion off the lower pole of the left kidney likely reflects a cyst although this cannot be fully characterized on this unenhanced study. This measures 2 cm. Adrenal glands and urinary bladder unremarkable. Stomach/Bowel: Large stool burden throughout the colon. Normal appendix. Stomach and small bowel grossly unremarkable unenhanced appearance. Vascular/Lymphatic: 3.3 cm infrarenal abdominal aortic aneurysm extending into both common iliac arteries which measure 2.4 cm on the right and 2.2 cm on the left. No adenopathy. Reproductive: No visible focal abnormality. Other: No free fluid or free air. Musculoskeletal: No acute bony abnormality. IMPRESSION: Infrarenal abdominal aortic aneurysm, 3.3 cm maximally. Recommend followup by ultrasound in 3 years. This recommendation follows ACR consensus guidelines: White Paper of the ACR Incidental Findings Committee II on Vascular Findings. J Am Coll Radiol 2013; 10:789-794. Aortic aneurysm NOS (ICD10-I71.9) Large stool burden throughout the colon. No acute findings in the abdomen or pelvis. Electronically Signed   By: Rolm Baptise M.D.   On: 03/28/2018 21:47   Dg Chest 2 View  Result Date: 03/28/2018 CLINICAL DATA:  Shortness of breath EXAM: CHEST - 2 VIEW COMPARISON:  09/23/2017 FINDINGS: Cardiomegaly. Prior CABG. No confluent airspace opacities, effusions or edema. No acute bony abnormality. IMPRESSION: Cardiomegaly.  No active disease. Electronically Signed   By: Rolm Baptise M.D.   On: 03/28/2018 20:07   Ct Chest Wo Contrast  Result  Date: 03/31/2018 CLINICAL DATA:  Gastric cancer. Staging. Asymptomatic. COPD. Ex-smoker, quitting 5 years ago. EXAM: CT CHEST WITHOUT CONTRAST TECHNIQUE: Multidetector CT imaging of the chest was performed following the standard protocol without IV contrast. COMPARISON:  Plain film chest 03/28/2018. Most recent chest CT 08/26/2017. FINDINGS: Cardiovascular: Advanced aortic and branch vessel atherosclerosis. Mild cardiomegaly, without pericardial effusion. Native coronary artery calcification, status post median sternotomy for CABG. Pulmonary artery enlargement, outflow tract 3.2 cm. Mediastinum/Nodes: No mediastinal or definite hilar adenopathy, given limitations of unenhanced CT. Lungs/Pleura: Resolved pleural fluid since the prior CT. Minimal motion degradation inferiorly. Left upper lobe 6 mm pulmonary nodule on image 43/4. The most recent exam was motion degraded. On a mildly motion degraded exam of 07/11/2017, this felt to be similar (image 32 of that exam). Upper Abdomen: Normal imaged portions of the liver, spleen, stomach, pancreas, gallbladder, adrenal glands, right kidney. Musculoskeletal: Marked bilateral gynecomastia. A right chest wall skin lesion of 9 mm on image 11/3 is similar on 08/26/17. No acute osseous abnormality. IMPRESSION: 1. A posterior left upper lobe 6 mm nodule is indeterminate, but felt to be similar on 07/11/2017 (both prior chest CTs are motion degraded). This could be re-evaluated at follow-up. 2. Otherwise, no evidence of metastatic disease in the chest. 3.  Aortic Atherosclerosis (ICD10-I70.0). 4. Bilateral gynecomastia. 5. Resolved bilateral pleural fluid and airspace disease since 08/26/2017. 6. Pulmonary artery enlargement suggests pulmonary arterial hypertension. 7. Right lateral chest wall lesion is similar to on  the prior. Consider physical exam correlation. Electronically Signed   By: Abigail Miyamoto M.D.   On: 03/31/2018 14:42   Dg Abd 2 Views  Result Date:  03/29/2018 CLINICAL DATA:  Acute left upper quadrant abdominal pain. EXAM: ABDOMEN - 2 VIEW COMPARISON:  CT scan of March 28, 2018. Radiograph of July 10, 2017. FINDINGS: The bowel gas pattern is normal. There is no evidence of free air. No radio-opaque calculi or other significant radiographic abnormality is seen. IMPRESSION: No evidence of bowel obstruction or ileus. Electronically Signed   By: Marijo Conception, M.D.   On: 03/29/2018 16:38    Results/Tests Pending at Time of Discharge: None  Discharge Medications:  Allergies as of 04/03/2018      Reactions   Penicillins Other (See Comments)   Has patient had a PCN reaction causing immediate rash, facial/tongue/throat swelling, SOB or lightheadedness with hypotension: Unknown Has patient had a PCN reaction causing severe rash involving mucus membranes or skin necrosis: Unknown Has patient had a PCN reaction that required hospitalization: Unknown Has patient had a PCN reaction occurring within the last 10 years: Unknown If all of the above answers are "NO", then may proceed with Cephalosporin use.      Medication List    STOP taking these medications   omeprazole 20 MG capsule Commonly known as:  PRILOSEC     TAKE these medications   acetaminophen 325 MG tablet Commonly known as:  TYLENOL Take 650 mg by mouth every 8 (eight) hours as needed for mild pain or fever.   amLODipine 5 MG tablet Commonly known as:  NORVASC Take 1 tablet (5 mg total) by mouth daily.   aspirin EC 81 MG tablet Take 1 tablet (81 mg total) by mouth daily. Start taking on:  April 07, 2018 What changed:  These instructions start on April 07, 2018. If you are unsure what to do until then, ask your doctor or other care provider.   atorvastatin 40 MG tablet Commonly known as:  LIPITOR TAKE ONE (1) TABLET BY MOUTH EVERY DAY AT 6:00PM What changed:  See the new instructions.   carvedilol 12.5 MG tablet Commonly known as:  COREG Take 1 tablet (12.5 mg total) by  mouth 2 (two) times daily with a meal.   cloNIDine 0.2 mg/24hr patch Commonly known as:  CATAPRES - Dosed in mg/24 hr Place 1 patch (0.2 mg total) onto the skin once a week. What changed:  when to take this   Darbepoetin Alfa 40 MCG/0.4ML Sosy injection Commonly known as:  ARANESP Inject 0.4 mLs (40 mcg total) into the skin every 30 (thirty) days.   doxazosin 8 MG tablet Commonly known as:  CARDURA Take 1 tablet (8 mg total) by mouth every evening.   feeding supplement (ENSURE ENLIVE) Liqd Take 237 mLs by mouth 3 (three) times daily between meals. What changed:  when to take this   ferrous sulfate 325 (65 FE) MG tablet Commonly known as:  FEROSUL Take 1 tablet (325 mg total) by mouth daily at 12 noon. What changed:  when to take this   glucose blood test strip Commonly known as:  ONETOUCH VERIO Use as instructed to test three times daily. ICD-10 code: E11.22   hydrALAZINE 100 MG tablet Commonly known as:  APRESOLINE TAKE ONE TABLET BY MOUTH EVERY EIGHT HOURS What changed:  See the new instructions.   insulin aspart 100 UNIT/ML injection Commonly known as:  novoLOG Inject 0-10 Units into the skin See admin instructions.  Three times daily per sliding scale: <150 = 0 units 151-200 = 2 units 201-250 = 4 units 251-300 = 6 units 301-350 = 8 units 351-400 = 10 units Call PCP if CBG <70 or >400   ipratropium-albuterol 0.5-2.5 (3) MG/3ML Soln Commonly known as:  DUONEB Take 3 mLs by nebulization every 6 (six) hours as needed (shortness of breath and wheezing).   isosorbide mononitrate 120 MG 24 hr tablet Commonly known as:  IMDUR Take 1 tablet (120 mg total) by mouth daily.   levothyroxine 100 MCG tablet Commonly known as:  SYNTHROID, LEVOTHROID Take 100 mcg by mouth daily before breakfast.   LONHALA MAGNAIR STARTER KIT 25 MCG/ML Soln Generic drug:  Glycopyrrolate Inhale 1 mL into the lungs 2 (two) times daily.   nitroGLYCERIN 0.4 MG SL tablet Commonly known as:   NITROSTAT Place 1 tablet (0.4 mg total) under the tongue every 5 (five) minutes as needed for chest pain.   ONETOUCH DELICA LANCETS 36G Misc 1 each by Does not apply route 3 (three) times daily. ICD-10 code: E11.22   [facility] VERIO FLEX SYSTEM w/Device Kit 1 kit by Does not apply route daily.   pantoprazole 40 MG tablet Commonly known as:  PROTONIX Take 1 tablet (40 mg total) by mouth 2 (two) times daily.   polyethylene glycol packet Commonly known as:  MIRALAX / GLYCOLAX Take 17 g by mouth daily as needed for mild constipation or moderate constipation. What changed:  reasons to take this   potassium chloride SA 20 MEQ tablet Commonly known as:  K-DUR,KLOR-CON Take 2 tablets (40 mEq total) by mouth daily.   torsemide 20 MG tablet Commonly known as:  DEMADEX Take 2 tablets (40 mg total) by mouth daily. What changed:  how much to take       Discharge Instructions: Please refer to Patient Instructions section of EMR for full details.  Patient was counseled important signs and symptoms that should prompt return to medical care, changes in medications, dietary instructions, activity restrictions, and follow up appointments.   Follow-Up Appointments: 04/07/2018 12:45pm - [facility] [place], Injection Room  Follow-up Information    [facility].   Specialty:  Emergency Medicine Why:  As needed, If symptoms worsen Contact information: [address] [id number] [place] [phone]         D/c summary provided by Dr. Zettie Cooley, MD.   Patriciaann Clan, DO  04/03/2018, 12:36 PM PGY-1, [place]

## 2018-04-01 NOTE — Progress Notes (Signed)
Spoke with the patient regarding his CT scan of the chest which was negative for metastatic disease.  No family was at the bedside.  Recommend surgical consult for his gastric adenocarcinoma.  I have spoken with his primary care team who is already in process of requesting a surgical consult.  Will await recommendations from surgery.

## 2018-04-01 NOTE — Progress Notes (Signed)
Notified by RN that patient wished to be full code and did not understand DNR discussion yesterday. Will make patient full code currently until we can have further clarification with patient this afternoon.   Patriciaann Clan, DO

## 2018-04-02 LAB — CBC
HCT: 26.4 % — ABNORMAL LOW (ref 39.0–52.0)
Hemoglobin: 8.8 g/dL — ABNORMAL LOW (ref 13.0–17.0)
MCH: 28.7 pg (ref 26.0–34.0)
MCHC: 33.3 g/dL (ref 30.0–36.0)
MCV: 86 fL (ref 80.0–100.0)
NRBC: 0 % (ref 0.0–0.2)
Platelets: 100 10*3/uL — ABNORMAL LOW (ref 150–400)
RBC: 3.07 MIL/uL — ABNORMAL LOW (ref 4.22–5.81)
RDW: 15.6 % — ABNORMAL HIGH (ref 11.5–15.5)
WBC: 5.2 10*3/uL (ref 4.0–10.5)

## 2018-04-02 LAB — GLUCOSE, CAPILLARY
Glucose-Capillary: 151 mg/dL — ABNORMAL HIGH (ref 70–99)
Glucose-Capillary: 137 mg/dL — ABNORMAL HIGH (ref 70–99)
Glucose-Capillary: 194 mg/dL — ABNORMAL HIGH (ref 70–99)
Glucose-Capillary: 179 mg/dL — ABNORMAL HIGH (ref 70–99)
Glucose-Capillary: 127 mg/dL — ABNORMAL HIGH (ref 70–99)

## 2018-04-02 LAB — CULTURE, BLOOD (ROUTINE X 2)
Culture: NO GROWTH
Culture: NO GROWTH
Special Requests: ADEQUATE

## 2018-04-02 LAB — BASIC METABOLIC PANEL
Anion gap: 8 (ref 5–15)
BUN: 51 mg/dL — ABNORMAL HIGH (ref 8–23)
CO2: 18 mmol/L — ABNORMAL LOW (ref 22–32)
Calcium: 8.1 mg/dL — ABNORMAL LOW (ref 8.9–10.3)
Chloride: 115 mmol/L — ABNORMAL HIGH (ref 98–111)
Creatinine, Ser: 2.54 mg/dL — ABNORMAL HIGH (ref 0.61–1.24)
GFR calc Af Amer: 29 mL/min — ABNORMAL LOW (ref >60)
GFR calc non Af Amer: 25 mL/min — ABNORMAL LOW (ref >60)
Glucose, Bld: 131 mg/dL — ABNORMAL HIGH (ref 70–99)
Potassium: 4.2 mmol/L (ref 3.5–5.1)
Sodium: 141 mmol/L (ref 135–145)

## 2018-04-02 LAB — TYPE AND SCREEN
ABO/RH(D): B NEG
Antibody Screen: NEGATIVE
UNIT DIVISION: 0

## 2018-04-02 LAB — BPAM RBC
Blood Product Expiration Date: 202003242359
ISSUE DATE / TIME: 202002271222
Unit Type and Rh: 1700

## 2018-04-02 LAB — PREALBUMIN: PREALBUMIN: 28 mg/dL (ref 18–38)

## 2018-04-02 MED ORDER — POTASSIUM CHLORIDE CRYS ER 20 MEQ PO TBCR
40.0000 meq | EXTENDED_RELEASE_TABLET | Freq: Every day | ORAL | Status: DC
  Administered 2018-04-02 – 2018-04-03 (×2): 40 meq via ORAL
  Filled 2018-04-02 (×2): qty 2

## 2018-04-02 MED ORDER — ISOSORBIDE MONONITRATE ER 60 MG PO TB24: 120.0000 mg | ORAL_TABLET | Freq: Every day | ORAL | Status: DC

## 2018-04-02 MED ORDER — TORSEMIDE 20 MG PO TABS
80.0000 mg | ORAL_TABLET | Freq: Every day | ORAL | Status: DC
  Administered 2018-04-02 – 2018-04-03 (×2): 80 mg via ORAL
  Filled 2018-04-02 (×2): qty 4

## 2018-04-02 NOTE — Progress Notes (Signed)
Family Medicine Teaching Service Daily Progress Note Intern Pager: [phone]  [name] Medical record number: [id number] Date of birth: [birth year] Age: 68 y.o. Gender: male  Primary Care Provider: Kathrene Alu, MD Consultants: meical oncology, GI, Palliative and Gen surgery  Code Status: Full Code  Pt Overview and Major Events to Date:  Admitted: 2018-03-30 for CC: Abnormal Lab  Hospital Day: 6  Assessment and Plan: Leonard [name] is a 68 y.o. male admitted for multiple episodes of hematemesis and significant anemia.  Patient diagnosed with gastric adenocarcinoma after EGD biopsy on 2/24. His chronic conditions include CAD status post CABG, peripheral vascular disease status post left AKA, diabetes, diastolic CHF, hypertension.   #Gastic adenocarcinoma  Surgery consulted and to present case at GI tumor board this coming week. Recs to d/c w/ f/u for cardiac pre-op a/p.   F/u o/p for further planning   #Acute GI Bleeding  resolved  Hgb 8.8  Transfusion goal >8.0  Continue BID 40mg  PO PTX   Continue to hold ASA 81  #HTN + #Diastolic HF + #Hx of infrarenal aneurysm  Continued to be hypertensive overnight. Patient is euvolemic on exam. Imdur on medication list; however, no refills since Summer 2019- will have to check on SNF med list.   Continue carvedilol, clonidine patch, hydralazine, amlodipine 5 mg, doxazosin 8 mg   Add back torsemide 80 mg + KDUR 40  #Consitpation  Mirilax daily   Senna QHS PRN    #CKD IV w/ ACD  Multifactorial in setting of CKD stage IV. Follows with  Dr. Janace Litten. On aranesp at home.   Holding iron supplements  #CAD status post CABG  Holding ASA 81 in setting of GI bleed  Continue atorvastatin  #Diabetes   Continue sSSI   #Thrombocytopenia, chronic   Continue to monitor in setting of GI bleed and transfusions  Hypothyroidism  Continue 100 mcg Synthroid  #Insomnia   Ativan 0.25mg  PRN (started during  this admission by palliative care).   #FEN/GI:   . Fluids: none  . Electrolytes: wnl   . Nutrition: Soft  Access: Right PIV 03/30/2022) VTE prophylaxis: Holding in the setting of GI bleed, SCD on right lower extremity  Disposition: Patient medically stable for discharge today.   SW to call [name] - per SW, if patient needs new bed, dispo would be prolonged.   Will talk to family about SNF placement as they did not like previous SNF. Patient is longterm placement.     Subjective:  NAEO.   Objective: BP (!) 176/81 (BP Location: Right Arm)   Pulse 71   Temp 97.9 F (36.6 C) (Oral)   Resp 17   Ht 5\' 8"  (1.727 m)   Wt 78 kg   SpO2 99%   BMI 26.15 kg/m   Physical Exam: Gen: NAD, alert, non-toxic, lying comfortably in bed. Skin: Warm and dry.  HEENT: NCAT.  MMM.  CV: RRR.  Normal S1-S2. No BLEE. Resp: CTAB. No increased WOB Abd: NTND. Pos bowel sounds Extremities: Warm and well perfused, left LE AKA  Laboratory: I have personally read and reviewed all labs and imaging studies.   CBC: Recent Labs  Lab 30-Mar-2018 1758 03/29/18 0500 03/30/18 1008 03/30/18 2139 03/31/18 0335 04/01/18 0528 04/01/18 1813 04/02/18 0458  WBC 6.7 5.9 4.6  --  5.6 5.8  --  5.2  NEUTROABS 5.3  --   --   --   --   --   --   --  HGB 5.9* 8.2* 6.8* 8.0* 8.0* 7.6* 8.9* 8.8*  HCT 20.0* 25.9* 21.7* 24.2* 24.7* 24.1* 27.8* 26.4*  MCV 90.9 87.2 87.5  --  85.5 86.4  --  86.0  PLT 93* 88* 97*  --  92* 92*  --  670*   Basic Metabolic Panel: Recent Labs  Lab 03/28/18 1758 03/29/18 0500 03/31/18 0335 04/01/18 0528 04/02/18 0458  NA 137 139 139 140 141  K 5.3* 5.2* 4.1 4.3 4.2  CL 113* 114* 115* 117* 115*  CO2 16* 16* 15* 17* 18*  GLUCOSE 187* 166* 159* 146* 131*  BUN 110* 106* 102* 70* 51*  CREATININE 3.63* 3.57* 2.97* 2.49* 2.54*  CALCIUM 8.3* 8.8* 8.3* 8.3* 8.1*   Wilber Oliphant, MD 04/02/2018, 12:03 PM PGY-1, [place] Intern pager: [phone], text pages welcome

## 2018-04-02 NOTE — Social Work (Signed)
Clinical Social Worker facilitated patient discharge including contacting patient family and facility to confirm patient discharge plans.  Clinical information faxed to facility and family agreeable with plan.  CSW will arrange PTAR  to [place] on 04/03/2018 RN to call [phone] with report prior to discharge on 04/03/2018.  Clinical Social Worker will sign off for now as social work intervention is no longer needed. Please consult [place] again if new need arises.  Westley Hummer, MSW, [place] Social Worker [phone]

## 2018-04-02 NOTE — Progress Notes (Signed)
Chart reviewed. Plan is to review patient case at GI tumor board next week. Spoke with sister - Leonard [name] - regarding plan to review his case at GI tumor board next week to determine best treatment plan for him. [name] will have her office call to arrange follow up next week. Sister states it is best to call her on her mobile number ([phone]) or niece Mathan Darroch [phone]) in addition to SNF to communicate appointment date and time. I encouraged family to go to follow up appointments so that they understand the plan.   Mikey Bussing, DNP, AGPCNP-BC, AOCNP

## 2018-04-02 NOTE — Clinical Social Work Placement (Signed)
   CLINICAL SOCIAL WORK PLACEMENT  NOTE [facility] on 04/03/2018 RN to call report to [phone] tomorrow prior to discharge.  Date:  04/02/2018  Patient Details  Name: Leonard [name] MRN: [id number] Date of Birth: January 27, 1951  Clinical Social Work is seeking post-discharge placement for this patient at the [place] level of care (*CSW will initial, date and re-position this form in  chart as items are completed):  Yes   Patient/family provided with [place] Work Department's list of facilities offering this level of care within the geographic area requested by the patient (or if unable, by the patient's family).  Yes   Patient/family informed of their freedom to choose among providers that offer the needed level of care, that participate in Medicare, Medicaid or managed care program needed by the patient, have an available bed and are willing to accept the patient.  Yes   Patient/family informed of [facility] ownership interest in [facility] and [facility], as well as of the fact that they are under no obligation to receive care at these facilities.  PASRR submitted to EDS on       PASRR number received on 03/29/18     Existing PASRR number confirmed on       FL2 transmitted to all facilities in geographic area requested by pt/family on 03/29/18     FL2 transmitted to all facilities within larger geographic area on       Patient informed that his/her managed care company has contracts with or will negotiate with certain facilities, including the following:        Yes   Patient/family informed of bed offers received.  Patient chooses bed at Other - please specify in the comment section below:     Physician recommends and patient chooses bed at      Patient to be transferred to Other - please specify in the comment section below: on 04/03/18.  Patient to be transferred to facility by PTAR     Patient family notified  on 04/02/18 of transfer.  Name of family member notified:  pt sister Leonard [name]     PHYSICIAN Please prepare priority discharge summary, including medications, Please prepare prescriptions     Additional Comment:    _______________________________________________ Alexander Mt, LCSWA 04/02/2018, 4:13 PM

## 2018-04-02 NOTE — Progress Notes (Addendum)
Patient ID: Leonard [name], male   DOB: 09-21-1950, 68 y.o.   MRN: [id number] 4 Days Post-Op   Subjective: No complaints this morning.  Up out of bed.  Objective: Vital signs in last 24 hours: Temp:  [97.7 F (36.5 C)-98 F (36.7 C)] 97.9 F (36.6 C) (02/28 0756) Pulse Rate:  [63-89] 63 (02/28 0756) Resp:  [12-19] 16 (02/27 2000) BP: (132-197)/(52-155) 197/60 (02/28 0756) SpO2:  [98 %-100 %] 99 % (02/28 0756) Last BM Date: 03/30/18  Intake/Output from previous day: 02/27 0701 - 02/28 0700 In: 1430.3 [P.O.:600; I.V.:515.3; Blood:315] Out: 2125 [Urine:2125] Intake/Output this shift: No intake/output data recorded.  General appearance: alert, cooperative and no distress Resp: clear to auscultation bilaterally GI: Soft and nontender and nondistended  Lab Results:  Recent Labs    04/01/18 0528 04/01/18 1813 04/02/18 0458  WBC 5.8  --  5.2  HGB 7.6* 8.9* 8.8*  HCT 24.1* 27.8* 26.4*  PLT 92*  --  100*   BMET Recent Labs    04/01/18 0528 04/02/18 0458  NA 140 141  K 4.3 4.2  CL 117* 115*  CO2 17* 18*  GLUCOSE 146* 131*  BUN 70* 51*  CREATININE 2.49* 2.54*  CALCIUM 8.3* 8.1*     Studies/Results: Ct Chest Wo Contrast  Result Date: 03/31/2018 CLINICAL DATA:  Gastric cancer. Staging. Asymptomatic. COPD. Ex-smoker, quitting 5 years ago. EXAM: CT CHEST WITHOUT CONTRAST TECHNIQUE: Multidetector CT imaging of the chest was performed following the standard protocol without IV contrast. COMPARISON:  Plain film chest 03/28/2018. Most recent chest CT 08/26/2017. FINDINGS: Cardiovascular: Advanced aortic and branch vessel atherosclerosis. Mild cardiomegaly, without pericardial effusion. Native coronary artery calcification, status post median sternotomy for CABG. Pulmonary artery enlargement, outflow tract 3.2 cm. Mediastinum/Nodes: No mediastinal or definite hilar adenopathy, given limitations of unenhanced CT. Lungs/Pleura: Resolved pleural fluid since the prior CT. Minimal  motion degradation inferiorly. Left upper lobe 6 mm pulmonary nodule on image 43/4. The most recent exam was motion degraded. On a mildly motion degraded exam of 07/11/2017, this felt to be similar (image 32 of that exam). Upper Abdomen: Normal imaged portions of the liver, spleen, stomach, pancreas, gallbladder, adrenal glands, right kidney. Musculoskeletal: Marked bilateral gynecomastia. A right chest wall skin lesion of 9 mm on image 11/3 is similar on 08/26/17. No acute osseous abnormality. IMPRESSION: 1. A posterior left upper lobe 6 mm nodule is indeterminate, but felt to be similar on 07/11/2017 (both prior chest CTs are motion degraded). This could be re-evaluated at follow-up. 2. Otherwise, no evidence of metastatic disease in the chest. 3.  Aortic Atherosclerosis (ICD10-I70.0). 4. Bilateral gynecomastia. 5. Resolved bilateral pleural fluid and airspace disease since 08/26/2017. 6. Pulmonary artery enlargement suggests pulmonary arterial hypertension. 7. Right lateral chest wall lesion is similar to on the prior. Consider physical exam correlation. Electronically Signed   By: Abigail Miyamoto M.D.   On: 03/31/2018 14:42    Anti-infectives: Anti-infectives (From admission, onward)   Start     Dose/Rate Route Frequency Ordered Stop   03/29/18 2200  ceFEPIme (MAXIPIME) 1 g in sodium chloride 0.9 % 100 mL IVPB  Status:  Discontinued     1 g 200 mL/hr over 30 Minutes Intravenous Every 24 hours 03/28/18 2106 03/29/18 0109   03/28/18 2115  ceFEPIme (MAXIPIME) 2 g in sodium chloride 0.9 % 100 mL IVPB     2 g 200 mL/hr over 30 Minutes Intravenous  Once 03/28/18 2106 03/29/18 0015   03/28/18 2100  metroNIDAZOLE (FLAGYL)  IVPB 500 mg  Status:  Discontinued     500 mg 100 mL/hr over 60 Minutes Intravenous  Once 03/28/18 2059 03/29/18 0403      Assessment/Plan: Gastric adenocarcinoma, 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  Deemed not a candidate for chemotherapy by medical oncology.  Attentionally resectable but high risk patient with at least twice average risk of serious complication or death on ACS risk calculator as detailed in previous notes.  Other option might include palliative radiotherapy.  Discussed all this again with the patient and his sister in the room today.  They are currently weighing options and are undecided.  I personally would not rule out surgery if they wish to be aggressive.  Patient is to be presented at GI tumor board this coming week.  If he is stable I would think the best approach would be discharge, cardiac preoperative assessment as an outpatient and follow-up in our office for decision making regarding surgery.  Would try to have him follow-up with [name] who does the majority of our gastric cancer surgery.  We will see as needed over this weekend and check back again Monday if he is still hospitalized.    LOS: 5 days    [name] 04/02/2018

## 2018-04-02 NOTE — Progress Notes (Signed)
Physical Therapy Treatment Patient Details Name: Leonard [name] MRN: [id number] DOB: [birth year] Today's Date: 04/02/2018    History of Present Illness Pt is a 68 y/o male admitted from SNF secondary to acute GI bleed. Pt is s/p EGD which revealed gastric adenocarcinoma. Per notes, possibility for surgery, however, pending workup and clearance. PMH includes CAD s/p CABG, COPD, CKD, CHF, COPD, AAA, pulm HTN, and s/p L AKA.     PT Comments    Patient progressing well towards PT goals. Tolerated standing from chair x5, working on powering up and standing balance for strengthening. Able to hop forwards/backwards and sideways with WB through BUEs and Min A for balance. Also tolerated squat pivot transfer from chair to/from bed with Min A. Pt with + emesis post activity. Secretary notified as Rn not available.  Plans to d/c back to SNF. Will follow.   Follow Up Recommendations  SNF;Supervision for mobility/OOB     Equipment Recommendations  None recommended by PT    Recommendations for Other Services       Precautions / Restrictions Precautions Precautions: Fall;Other (comment) Precaution Comments: L AKA  Restrictions Weight Bearing Restrictions: No    Mobility  Bed Mobility               General bed mobility comments: Sitting in chair upon arrival.   Transfers Overall transfer level: Needs assistance Equipment used: Rolling walker (2 wheeled) Transfers: Sit to/from [employer] Sit to Stand: Mod assist;+2 physical assistance   Squat pivot transfers: Min assist     General transfer comment: Assist to power to standing with cues for use of momentum and hand placement; Able to stand ~45 sec, ~30 sec, ~35 sec with heavy reliance on UEs. Squat pivot transfer chair to/from bed x2.  Ambulation/Gait Ambulation/Gait assistance: Min assist Gait Distance (Feet): 4 Feet Assistive device: Rolling walker (2 wheeled) Gait Pattern/deviations: Step-to pattern      General Gait Details: Able to perform hopping forward/backward and to the side with Min A for balance. Fatigues. + emesis post activity.    Stairs             Wheelchair Mobility    Modified Rankin (Stroke Patients Only)       Balance Overall balance assessment: Needs assistance Sitting-balance support: No upper extremity supported;Feet supported Sitting balance-Leahy Scale: Fair     Standing balance support: Bilateral upper extremity supported;During functional activity Standing balance-Leahy Scale: Poor Standing balance comment: Reliant on UE and external support                             Cognition Arousal/Alertness: Awake/alert Behavior During Therapy: WFL for tasks assessed/performed Overall Cognitive Status: Within Functional Limits for tasks assessed                                        Exercises      General Comments General comments (skin integrity, edema, etc.): Pt's sister present during session.      Pertinent Vitals/Pain Pain Assessment: No/denies pain    Home Living                      Prior Function            PT Goals (current goals can now be found in the care plan section) Progress towards PT goals: Progressing  toward goals    Frequency    Min 2X/week      PT Plan Current plan remains appropriate    Co-evaluation              AM-PAC PT "6 Clicks" Mobility   Outcome Measure  Help needed turning from your back to your side while in a flat bed without using bedrails?: A Little Help needed moving from lying on your back to sitting on the side of a flat bed without using bedrails?: A Little Help needed moving to and from a bed to a chair (including a wheelchair)?: A Little Help needed standing up from a chair using your arms (e.g., wheelchair or bedside chair)?: A Lot Help needed to walk in hospital room?: A Lot Help needed climbing 3-5 steps with a railing? : Total 6 Click Score:  14    End of Session Equipment Utilized During Treatment: Gait belt Activity Tolerance: Other (comment)(emesis) Patient left: in chair;with call bell/phone within reach;with family/visitor present Nurse Communication: Mobility status;Other (comment)(emesis) PT Visit Diagnosis: Unsteadiness on feet (R26.81);Muscle weakness (generalized) (M62.81);Difficulty in walking, not elsewhere classified (R26.2)     Time: 2956-2130 PT Time Calculation (min) (ACUTE ONLY): 27 min  Charges:  $Gait Training: 8-22 mins $Therapeutic Activity: 8-22 mins                     Wray Kearns, [place], DPT Acute Rehabilitation Services Pager [phone] Office [place] 04/02/2018, 11:25 AM

## 2018-04-02 NOTE — Social Work (Signed)
CSW continuing to follow for support with disposition when medically appropriate. Pt from [facility]. Westley Hummer, MSW, [place] Work [phone]

## 2018-04-02 NOTE — Social Work (Signed)
Spoke with [name], pt sister on telephone at request of MD team. Pt sister states that she would like pt to return to [facility] but her concern is with him going this afternoon/tonight with staffing and her not being off work and able to accompany pt to SNF. She cites her concerns over his hemoglobin levels and transfusions and discharging today with less oversight at the SNF.   She feels comfortable with pt returning to SNF tomorrow morning so that she will be off work as will his other family and they can help support and monitor pt.   Paged and spoke with Dr. Maudie Mercury about this and we will have everything arranged for pt to d/c tomorrow.  Westley Hummer, MSW, [place] Work [phone]

## 2018-04-03 ENCOUNTER — Encounter: Payer: Self-pay | Admitting: Family Medicine

## 2018-04-03 DIAGNOSIS — I10 Essential (primary) hypertension: Secondary | ICD-10-CM | POA: Diagnosis present

## 2018-04-03 DIAGNOSIS — Z85828 Personal history of other malignant neoplasm of skin: Secondary | ICD-10-CM | POA: Diagnosis not present

## 2018-04-03 DIAGNOSIS — C169 Malignant neoplasm of stomach, unspecified: Secondary | ICD-10-CM | POA: Diagnosis not present

## 2018-04-03 DIAGNOSIS — N184 Chronic kidney disease, stage 4 (severe): Secondary | ICD-10-CM | POA: Diagnosis not present

## 2018-04-03 DIAGNOSIS — D472 Monoclonal gammopathy: Secondary | ICD-10-CM | POA: Diagnosis present

## 2018-04-03 DIAGNOSIS — E1151 Type 2 diabetes mellitus with diabetic peripheral angiopathy without gangrene: Secondary | ICD-10-CM | POA: Diagnosis not present

## 2018-04-03 DIAGNOSIS — C801 Malignant (primary) neoplasm, unspecified: Secondary | ICD-10-CM | POA: Diagnosis present

## 2018-04-03 DIAGNOSIS — Z801 Family history of malignant neoplasm of trachea, bronchus and lung: Secondary | ICD-10-CM | POA: Diagnosis not present

## 2018-04-03 DIAGNOSIS — E1122 Type 2 diabetes mellitus with diabetic chronic kidney disease: Secondary | ICD-10-CM | POA: Diagnosis not present

## 2018-04-03 DIAGNOSIS — Z79899 Other long term (current) drug therapy: Secondary | ICD-10-CM | POA: Diagnosis not present

## 2018-04-03 DIAGNOSIS — D509 Iron deficiency anemia, unspecified: Secondary | ICD-10-CM | POA: Diagnosis not present

## 2018-04-03 DIAGNOSIS — J90 Pleural effusion, not elsewhere classified: Secondary | ICD-10-CM | POA: Diagnosis not present

## 2018-04-03 DIAGNOSIS — K922 Gastrointestinal hemorrhage, unspecified: Secondary | ICD-10-CM | POA: Diagnosis not present

## 2018-04-03 DIAGNOSIS — I714 Abdominal aortic aneurysm, without rupture: Secondary | ICD-10-CM | POA: Diagnosis not present

## 2018-04-03 DIAGNOSIS — J449 Chronic obstructive pulmonary disease, unspecified: Secondary | ICD-10-CM | POA: Diagnosis not present

## 2018-04-03 DIAGNOSIS — Z86718 Personal history of other venous thrombosis and embolism: Secondary | ICD-10-CM | POA: Diagnosis not present

## 2018-04-03 DIAGNOSIS — I700 Atherosclerosis of aorta: Secondary | ICD-10-CM | POA: Diagnosis not present

## 2018-04-03 DIAGNOSIS — Z87891 Personal history of nicotine dependence: Secondary | ICD-10-CM | POA: Diagnosis not present

## 2018-04-03 DIAGNOSIS — Z794 Long term (current) use of insulin: Secondary | ICD-10-CM | POA: Diagnosis not present

## 2018-04-03 DIAGNOSIS — R911 Solitary pulmonary nodule: Secondary | ICD-10-CM | POA: Diagnosis not present

## 2018-04-03 DIAGNOSIS — I129 Hypertensive chronic kidney disease with stage 1 through stage 4 chronic kidney disease, or unspecified chronic kidney disease: Secondary | ICD-10-CM | POA: Diagnosis not present

## 2018-04-03 DIAGNOSIS — M109 Gout, unspecified: Secondary | ICD-10-CM | POA: Diagnosis present

## 2018-04-03 DIAGNOSIS — M2550 Pain in unspecified joint: Secondary | ICD-10-CM | POA: Diagnosis not present

## 2018-04-03 DIAGNOSIS — R1012 Left upper quadrant pain: Secondary | ICD-10-CM | POA: Diagnosis not present

## 2018-04-03 DIAGNOSIS — N62 Hypertrophy of breast: Secondary | ICD-10-CM | POA: Diagnosis not present

## 2018-04-03 DIAGNOSIS — Z7401 Bed confinement status: Secondary | ICD-10-CM | POA: Diagnosis not present

## 2018-04-03 DIAGNOSIS — E1140 Type 2 diabetes mellitus with diabetic neuropathy, unspecified: Secondary | ICD-10-CM | POA: Diagnosis not present

## 2018-04-03 DIAGNOSIS — F0150 Vascular dementia without behavioral disturbance: Secondary | ICD-10-CM | POA: Diagnosis present

## 2018-04-03 DIAGNOSIS — D631 Anemia in chronic kidney disease: Secondary | ICD-10-CM | POA: Diagnosis not present

## 2018-04-03 DIAGNOSIS — R262 Difficulty in walking, not elsewhere classified: Secondary | ICD-10-CM | POA: Diagnosis present

## 2018-04-03 DIAGNOSIS — J9620 Acute and chronic respiratory failure, unspecified whether with hypoxia or hypercapnia: Secondary | ICD-10-CM | POA: Diagnosis present

## 2018-04-03 DIAGNOSIS — D62 Acute posthemorrhagic anemia: Secondary | ICD-10-CM | POA: Diagnosis not present

## 2018-04-03 DIAGNOSIS — E118 Type 2 diabetes mellitus with unspecified complications: Secondary | ICD-10-CM | POA: Diagnosis not present

## 2018-04-03 DIAGNOSIS — D002 Carcinoma in situ of stomach: Secondary | ICD-10-CM | POA: Diagnosis present

## 2018-04-03 DIAGNOSIS — L989 Disorder of the skin and subcutaneous tissue, unspecified: Secondary | ICD-10-CM | POA: Diagnosis not present

## 2018-04-03 DIAGNOSIS — E782 Mixed hyperlipidemia: Secondary | ICD-10-CM | POA: Diagnosis not present

## 2018-04-03 DIAGNOSIS — J431 Panlobular emphysema: Secondary | ICD-10-CM | POA: Diagnosis present

## 2018-04-03 DIAGNOSIS — C163 Malignant neoplasm of pyloric antrum: Secondary | ICD-10-CM | POA: Diagnosis not present

## 2018-04-03 DIAGNOSIS — M6281 Muscle weakness (generalized): Secondary | ICD-10-CM | POA: Diagnosis present

## 2018-04-03 DIAGNOSIS — I5032 Chronic diastolic (congestive) heart failure: Secondary | ICD-10-CM | POA: Diagnosis present

## 2018-04-03 DIAGNOSIS — Z7982 Long term (current) use of aspirin: Secondary | ICD-10-CM | POA: Diagnosis not present

## 2018-04-03 DIAGNOSIS — I509 Heart failure, unspecified: Secondary | ICD-10-CM | POA: Diagnosis not present

## 2018-04-03 DIAGNOSIS — K219 Gastro-esophageal reflux disease without esophagitis: Secondary | ICD-10-CM | POA: Diagnosis present

## 2018-04-03 DIAGNOSIS — I2721 Secondary pulmonary arterial hypertension: Secondary | ICD-10-CM | POA: Diagnosis not present

## 2018-04-03 DIAGNOSIS — E039 Hypothyroidism, unspecified: Secondary | ICD-10-CM | POA: Diagnosis not present

## 2018-04-03 DIAGNOSIS — R293 Abnormal posture: Secondary | ICD-10-CM | POA: Diagnosis present

## 2018-04-03 DIAGNOSIS — I2510 Atherosclerotic heart disease of native coronary artery without angina pectoris: Secondary | ICD-10-CM | POA: Diagnosis not present

## 2018-04-03 DIAGNOSIS — I130 Hypertensive heart and chronic kidney disease with heart failure and stage 1 through stage 4 chronic kidney disease, or unspecified chronic kidney disease: Secondary | ICD-10-CM | POA: Diagnosis not present

## 2018-04-03 DIAGNOSIS — R918 Other nonspecific abnormal finding of lung field: Secondary | ICD-10-CM | POA: Diagnosis not present

## 2018-04-03 DIAGNOSIS — K31811 Angiodysplasia of stomach and duodenum with bleeding: Secondary | ICD-10-CM | POA: Diagnosis present

## 2018-04-03 DIAGNOSIS — R41841 Cognitive communication deficit: Secondary | ICD-10-CM | POA: Diagnosis present

## 2018-04-03 DIAGNOSIS — Z7901 Long term (current) use of anticoagulants: Secondary | ICD-10-CM | POA: Diagnosis not present

## 2018-04-03 DIAGNOSIS — D649 Anemia, unspecified: Secondary | ICD-10-CM | POA: Diagnosis present

## 2018-04-03 LAB — GLUCOSE, CAPILLARY
Glucose-Capillary: 132 mg/dL — ABNORMAL HIGH (ref 70–99)
Glucose-Capillary: 132 mg/dL — ABNORMAL HIGH (ref 70–99)
Glucose-Capillary: 160 mg/dL — ABNORMAL HIGH (ref 70–99)

## 2018-04-03 MED ORDER — AMLODIPINE BESYLATE 5 MG PO TABS: 5.0000 mg | ORAL_TABLET | Freq: Every day | ORAL | 0 refills | Status: DC

## 2018-04-03 MED ORDER — PANTOPRAZOLE SODIUM 40 MG PO TBEC: 40.0000 mg | DELAYED_RELEASE_TABLET | Freq: Two times a day (BID) | ORAL | 1 refills | Status: DC

## 2018-04-03 MED ORDER — ASPIRIN EC 81 MG PO TBEC: 81.0000 mg | DELAYED_RELEASE_TABLET | Freq: Every day | ORAL | 0 refills | Status: DC

## 2018-04-03 MED ORDER — PANTOPRAZOLE SODIUM 40 MG PO TBEC: 40.0000 mg | DELAYED_RELEASE_TABLET | Freq: Two times a day (BID) | ORAL | Status: AC

## 2018-04-03 NOTE — Progress Notes (Signed)
Patient will discharge too: [facility]  Discharge date: 04/03/2018 Family notified: Elwin Sleight, sister Transport by: Corey Harold  Per MD patient is appropriate for discharge and will discharge too [facility] in room 113A. RN, patient, patient's family, and facility have been notified of discharge. Assessment, FL-2, PASRR, and discharge summary sent to facillity. RN was provided with the following number for report: [phone]. PTAR was arranged to transfer patient to the facility. CSW is signing off.   Lamonte Richer, LCSW, [place] Worker II [phone]

## 2018-04-03 NOTE — Progress Notes (Addendum)
Discharge orders to [facility] facility. Patient is aware of impending transfer. Social Worker called to touch base and will contact transportation services for around 1300 pm. IV's discontinued. Vital Signs are stable. AVS summary prepared to be given to transport. Simmie Davies RN  Addendum: Pt ready and waiting on transport. Family in with patient at the moment. Dressed in street clothes, belongings gathered. Pt watch on Left Wrist. No other belongings according to patient. Report given to Jessica at [facility] at 1300. [facility] to transport. Simmie Davies RN

## 2018-04-03 NOTE — Progress Notes (Signed)
He denies any concern today.  No new findings on physical exam.  BP however remains elevated. It was 173/60 this morning when I examined him.  Increase Norvasc to 10 mg today. Looks like he already got his Norvasc 5 mg this morning in addition to all other meds. We can give additional 5 mg now and start 10 mg qd tomorrow. Monitor BP closely.  Other conditions stable. I will cosign resident's progress note for today once it is done.

## 2018-04-03 NOTE — Progress Notes (Signed)
Family Medicine Teaching Service Daily Progress Note Intern Pager: [phone]  [name] Medical record number: [id number] Date of birth: May 15, 1950 Age: 68 y.o. Gender: male  Primary Care Provider: Kathrene Alu, MD Consultants: meical oncology, GI, Palliative and Gen surgery  Code Status: Full Code  Pt Overview and Major Events to Date:  Admitted: 03/28/2018 for CC: Abnormal Lab  Hospital Day: 6  Assessment and Plan: CLEOFAS HUDGINS is a 68 y.o. male admitted for multiple episodes of hematemesis and significant anemia found to have gastric adenocarcinoma. His chronic conditions include CAD status post CABG, peripheral vascular disease status post left AKA, diabetes, diastolic CHF, hypertension.   Gastic adenocarcinoma  Surgery consulted and to present case at GI tumor board this coming week. Recs to d/c w/ f/u for cardiac pre-op a/p.  -F/u o/p for further planning  -Will need cardiac clearance outpatient for possible surgery  Acute GI Bleeding  resolved  Hgb 8.9>8.8 -Transfusion goal >8.0 d/t cardiac clearance -Continue BID 40mg  PO PTX  -Continue to hold ASA 81  HTN + Diastolic HF + Hx of infrarenal aneurysm  Hypertensive to 150/90 overnight. Patient is euvolemic on exam. Imdur on medication list; however, no refills since Summer 2019 - will have to check on SNF med list.  -Continue carvedilol, clonidine patch, hydralazine, doxazosin 8 mg. Increase amlodipine 5 mg up to Amlodipine 10mg . -Add back torsemide 80 mg + KDUR 40  Constipation -Mirilax daily  -Senna QHS PRN    CKD IV w/ ACD  Multifactorial in setting of CKD stage IV. Follows with Dr. Janace Litten. On aranesp at home.  -Holding iron supplements inpatient, can continue outpatient  CAD status post CABG -Holding ASA 81 in setting of GI bleed -Continue atorvastatin  Diabetes  -Continue sSSI   Thrombocytopenia, chronic  -Continue to monitor in setting of GI bleed and  transfusions  Hypothyroidism -Continue 100 mcg Synthroid  Insomnia  -Ativan 0.25mg  PRN (started during this admission by palliative care).   FEN/GI:   Fluids: none  Electrolytes: wnl   Nutrition: Soft  Access: Right PIV (2/23) VTE prophylaxis: Holding in the setting of GI bleed, SCD on right lower extremity  Disposition: Patient medically stable for discharge   Subjective:  Patient reports feeling better today and feels ready to leave the hospital.   Objective: Temp:  [97.5 F (36.4 C)-98 F (36.7 C)] 97.6 F (36.4 C) (02/29 0800) Pulse Rate:  [62-87] 80 (02/29 0916) Resp:  [10-21] 18 (02/29 0916) BP: (142-189)/(59-81) 170/72 (02/29 0916) SpO2:  [98 %-100 %] 98 % (02/29 0916) Physical Exam: General: NAD, resting in bed relaxing Cardiovascular: RRR, S1S2 present, no murmurs, rubs or gallops Respiratory: CTA bilaterally, no respiratory distress Abdomen: soft, nontender, bowel sounds in 4 quadrants Extremities: L BKA, R lower extremity without deformity, 2+ pedal pulses, no edema or evidence of DVT  Laboratory: Recent Labs  Lab 03/31/18 0335 04/01/18 0528 04/01/18 1813 04/02/18 0458  WBC 5.6 5.8  --  5.2  HGB 8.0* 7.6* 8.9* 8.8*  HCT 24.7* 24.1* 27.8* 26.4*  PLT 92* 92*  --  100*   Recent Labs  Lab 03/28/18 1758 03/29/18 0500 03/31/18 0335 04/01/18 0528 04/02/18 0458  NA 137 139 139 140 141  K 5.3* 5.2* 4.1 4.3 4.2  CL 113* 114* 115* 117* 115*  CO2 16* 16* 15* 17* 18*  BUN 110* 106* 102* 70* 51*  CREATININE 3.63* 3.57* 2.97* 2.49* 2.54*  CALCIUM 8.3* 8.8* 8.3* 8.3* 8.1*  PROT 5.6* 5.9*  --   --   --  BILITOT 0.5 0.8  --   --   --   ALKPHOS 72 74  --   --   --   ALT 11 12  --   --   --   AST 11* 12*  --   --   --   GLUCOSE 187* 166* 159* 146* 131*   Imaging/Diagnostic Tests: No new imaging.  Daisy Floro, DO 04/03/2018, 11:06 AM PGY-1, [place] Intern pager: [phone], text pages welcome

## 2018-04-03 NOTE — Discharge Instructions (Signed)
Abdominal Pain, Adult  Many things can cause belly (abdominal) pain. Most times, belly pain is not dangerous. Many cases of belly pain can be watched and treated at home. Sometimes belly pain is serious, though. Your doctor will try to find the cause of your belly pain. Follow these instructions at home:  Take over-the-counter and prescription medicines only as told by your doctor. Do not take medicines that help you poop (laxatives) unless told to by your doctor.  Drink enough fluid to keep your pee (urine) clear or pale yellow.  Watch your belly pain for any changes.  Keep all follow-up visits as told by your doctor. This is important. Contact a doctor if:  Your belly pain changes or gets worse.  You are not hungry, or you lose weight without trying.  You are having trouble pooping (constipated) or have watery poop (diarrhea) for more than 2-3 days.  You have pain when you pee or poop.  Your belly pain wakes you up at night.  Your pain gets worse with meals, after eating, or with certain foods.  You are throwing up and cannot keep anything down.  You have a fever. Get help right away if:  Your pain does not go away as soon as your doctor says it should.  You cannot stop throwing up.  Your pain is only in areas of your belly, such as the right side or the left lower part of the belly.  You have bloody or black poop, or poop that looks like tar.  You have very bad pain, cramping, or bloating in your belly.  You have signs of not having enough fluid or water in your body (dehydration), such as: ? Dark pee, very little pee, or no pee. ? Cracked lips. ? Dry mouth. ? Sunken eyes. ? Sleepiness. ? Weakness. This information is not intended to replace advice given to you by your health care provider. Make sure you discuss any questions you have with your health care provider. Document Released: 07/09/2007 Document Revised: 08/10/2015 Document Reviewed: 07/04/2015 Elsevier  Interactive Patient Education  2019 [employer].   Acute Pain, Adult Acute pain is a type of pain that may last for just a few days or as long as six months. It is often related to an illness, injury, or medical procedure. Acute pain may be mild, moderate, or severe. It usually goes away once your injury has healed or you are no longer ill. Pain can make it hard for you to do daily activities. It can cause anxiety and lead to other problems if left untreated. Treatment depends on the cause and severity of your acute pain. Follow these instructions at home:  Check your pain level as told by your health care provider.  Take over-the-counter and prescription medicines only as told by your health care provider.  If you are taking prescription pain medicine: ? Ask your health care provider about taking a stool softener or laxative to prevent constipation. ? Do not stop taking the medicine suddenly. Talk to your health care provider about how and when to discontinue prescription pain medicine. ? If your pain is severe, do not take more pills than instructed by your health care provider. ? Do not take other over-the-counter pain medicines in addition to this medicine unless told by your health care provider. ? Do not drive or operate heavy machinery while taking prescription pain medicine.  Apply ice or heat as told by your health care provider. These may reduce swelling and  pain.  Ask your health care provider if other strategies such as distraction, relaxation, or physical therapies can help your pain.  Keep all follow-up visits as told by your health care provider. This is important. Contact a health care provider if:  You have pain that is not controlled by medicine.  Your pain does not improve or gets worse.  You have side effects from pain medicines, such as vomitingor confusion. Get help right away if:  You have severe pain.  You have trouble breathing.  You lose  consciousness.  You have chest pain or pressure that lasts for more than a few minutes. Along with the chest pain you may: ? Have pain or discomfort in one or both arms, your back, neck, jaw, or stomach. ? Have shortness of breath. ? Break out in a cold sweat. ? Feel nauseous. ? Become light-headed. These symptoms may represent a serious problem that is an emergency. Do not wait to see if the symptoms will go away. Get medical help right away. Call your local emergency services (911 in the [place] Do not drive yourself to the hospital. This information is not intended to replace advice given to you by your health care provider. Make sure you discuss any questions you have with your health care provider. Document Released: 02/04/2015 Document Revised: 06/29/2015 Document Reviewed: 02/04/2015 Elsevier Interactive Patient Education  2019 Elsevier Inc.   Acute Kidney Injury, Adult  Acute kidney injury is a sudden worsening of kidney function. The kidneys are organs that have several jobs. They filter the blood to remove waste products and extra fluid. They also maintain a healthy balance of minerals and hormones in the body, which helps control blood pressure and keep bones strong. With this condition, your kidneys do not do their jobs as well as they should. This condition ranges from mild to severe. Over time it may develop into long-lasting (chronic) kidney disease. Early detection and treatment may prevent acute kidney injury from developing into a chronic condition. What are the causes? Common causes of this condition include:  A problem with blood flow to the kidneys. This may be caused by: ? Low blood pressure (hypotension) or shock. ? Blood loss. ? Heart and blood vessel (cardiovascular) disease. ? Severe burns. ? Liver disease.  Direct damage to the kidneys. This may be caused by: ? Certain medicines. ? A kidney infection. ? Poisoning. ? Being around or in contact with toxic  substances. ? A surgical wound. ? A hard, direct hit to the kidney area.  A sudden blockage of urine flow. This may be caused by: ? Cancer. ? Kidney stones. ? An enlarged prostate in males. What are the signs or symptoms? Symptoms of this condition may not be obvious until the condition becomes severe. Symptoms of this condition can include:  Tiredness (lethargy), or difficulty staying awake.  Nausea or vomiting.  Swelling (edema) of the face, legs, ankles, or feet.  Problems with urination, such as: ? Abdominal pain, or pain along the side of your stomach (flank). ? Decreased urine production. ? Decrease in the force of urine flow.  Muscle twitches and cramps, especially in the legs.  Confusion or trouble concentrating.  Loss of appetite.  Fever. How is this diagnosed? This condition may be diagnosed with tests, including:  Blood tests.  Urine tests.  Imaging tests.  A test in which a sample of tissue is removed from the kidneys to be examined under a microscope (kidney biopsy). How is this treated? Treatment  for this condition depends on the cause and how severe the condition is. In mild cases, treatment may not be needed. The kidneys may heal on their own. In more severe cases, treatment will involve:  Treating the cause of the kidney injury. This may involve changing any medicines you are taking or adjusting your dosage.  Fluids. You may need specialized IV fluids to balance your body's needs.  Having a catheter placed to drain urine and prevent blockages.  Preventing problems from occurring. This may mean avoiding certain medicines or procedures that can cause further injury to the kidneys. In some cases treatment may also require:  A procedure to remove toxic wastes from the body (dialysis or continuous renal replacement therapy - CRRT).  Surgery. This may be done to repair a torn kidney, or to remove the blockage from the urinary system. Follow these  instructions at home: Medicines  Take over-the-counter and prescription medicines only as told by your health care provider.  Do not take any new medicines without your health care provider's approval. Many medicines can worsen your kidney damage.  Do not take any vitamin and mineral supplements without your health care provider's approval. Many nutritional supplements can worsen your kidney damage. Lifestyle  If your health care provider prescribed changes to your diet, follow them. You may need to decrease the amount of protein you eat.  Achieve and maintain a healthy weight. If you need help with this, ask your health care provider.  Start or continue an exercise plan. Try to exercise at least 30 minutes a day, 5 days a week.  Do not use any tobacco products, such as cigarettes, chewing tobacco, and e-cigarettes. If you need help quitting, ask your health care provider. General instructions  Keep track of your blood pressure. Report changes in your blood pressure as told by your health care provider.  Stay up to date with immunizations. Ask your health care provider which immunizations you need.  Keep all follow-up visits as told by your health care provider. This is important. Where to find more information  American Association of Kidney Patients: BombTimer.gl  National Kidney Foundation: www.kidney.[place]: https://mathis.com/  [facility]: ? www.lifeoptions.org ? www.kidneyschool.org Contact a health care provider if:  Your symptoms get worse.  You develop new symptoms. Get help right away if:  You develop symptoms of worsening kidney disease, which include: ? Headaches. ? Abnormally dark or light skin. ? Easy bruising. ? Frequent hiccups. ? Chest pain. ? Shortness of breath. ? End of menstruation in women. ? Seizures. ? Confusion or altered mental status. ? Abdominal or back pain. ? Itchiness.  You have a  fever.  Your body is producing less urine.  You have pain or bleeding when you urinate. Summary  Acute kidney injury is a sudden worsening of kidney function.  Acute kidney injury can be caused by problems with blood flow to the kidneys, direct damage to the kidneys, and sudden blockage of urine flow.  Symptoms of this condition may not be obvious until it becomes severe. Symptoms may include edema, lethargy, confusion, nausea or vomiting, and problems passing urine.  This condition can usually be diagnosed with blood tests, urine tests, and imaging tests. Sometimes a kidney biopsy is done to diagnose this condition.  Treatment for this condition often involves treating the underlying cause. It is treated with fluids, medicines, dialysis, diet changes, or surgery. This information is not intended to replace advice given to you by your  health care provider. Make sure you discuss any questions you have with your health care provider. Document Released: 08/05/2010 Document Revised: 05/22/2016 Document Reviewed: 01/11/2016 Elsevier Interactive Patient Education  2019 [employer].

## 2018-04-05 DIAGNOSIS — J449 Chronic obstructive pulmonary disease, unspecified: Secondary | ICD-10-CM | POA: Diagnosis not present

## 2018-04-05 DIAGNOSIS — C169 Malignant neoplasm of stomach, unspecified: Secondary | ICD-10-CM | POA: Diagnosis not present

## 2018-04-05 DIAGNOSIS — D62 Acute posthemorrhagic anemia: Secondary | ICD-10-CM | POA: Diagnosis not present

## 2018-04-05 DIAGNOSIS — N184 Chronic kidney disease, stage 4 (severe): Secondary | ICD-10-CM | POA: Diagnosis not present

## 2018-04-05 LAB — GLUCOSE, CAPILLARY: Glucose-Capillary: 168 mg/dL — ABNORMAL HIGH (ref 70–99)

## 2018-04-07 ENCOUNTER — Other Ambulatory Visit: Payer: Self-pay

## 2018-04-07 ENCOUNTER — Ambulatory Visit (HOSPITAL_COMMUNITY)
Admission: RE | Admit: 2018-04-07 | Discharge: 2018-04-07 | Disposition: A | Discharge status: Home / Self Care | Payer: No Typology Code available for payment source | Source: Ambulatory Visit | Attending: Nephrology | Admitting: Nephrology

## 2018-04-07 VITALS — BP 129/51 | HR 76 | Temp 99.0°F | Resp 20

## 2018-04-07 DIAGNOSIS — N184 Chronic kidney disease, stage 4 (severe): Secondary | ICD-10-CM | POA: Insufficient documentation

## 2018-04-07 DIAGNOSIS — E118 Type 2 diabetes mellitus with unspecified complications: Secondary | ICD-10-CM | POA: Insufficient documentation

## 2018-04-07 LAB — IRON AND TIBC
IRON: 20 ug/dL — AB (ref 45–182)
Saturation Ratios: 9 % — ABNORMAL LOW (ref 17.9–39.5)
TIBC: 225 ug/dL — ABNORMAL LOW (ref 250–450)
UIBC: 205 ug/dL

## 2018-04-07 LAB — CBC WITH DIFFERENTIAL/PLATELET
Abs Immature Granulocytes: 0.05 10*3/uL (ref 0.00–0.07)
Basophils Absolute: 0 10*3/uL (ref 0.0–0.1)
Basophils Relative: 0 %
Eosinophils Absolute: 0.4 10*3/uL (ref 0.0–0.5)
Eosinophils Relative: 6 %
HCT: 23.5 % — ABNORMAL LOW (ref 39.0–52.0)
Hemoglobin: 7 g/dL — ABNORMAL LOW (ref 13.0–17.0)
IMMATURE GRANULOCYTES: 1 %
LYMPHS PCT: 11 %
Lymphs Abs: 0.8 10*3/uL (ref 0.7–4.0)
MCH: 26.7 pg (ref 26.0–34.0)
MCHC: 29.8 g/dL — ABNORMAL LOW (ref 30.0–36.0)
MCV: 89.7 fL (ref 80.0–100.0)
Monocytes Absolute: 0.8 10*3/uL (ref 0.1–1.0)
Monocytes Relative: 12 %
Neutro Abs: 5.1 10*3/uL (ref 1.7–7.7)
Neutrophils Relative %: 70 %
Platelets: 205 10*3/uL (ref 150–400)
RBC: 2.62 MIL/uL — ABNORMAL LOW (ref 4.22–5.81)
RDW: 14.7 % (ref 11.5–15.5)
WBC: 7.2 10*3/uL (ref 4.0–10.5)
nRBC: 0 % (ref 0.0–0.2)

## 2018-04-07 LAB — FERRITIN: Ferritin: 748 ng/mL — ABNORMAL HIGH (ref 24–336)

## 2018-04-07 MED ORDER — DARBEPOETIN ALFA 60 MCG/0.3ML IJ SOSY
PREFILLED_SYRINGE | INTRAMUSCULAR | Status: AC
  Filled 2018-04-07: qty 0.3

## 2018-04-07 MED ORDER — DARBEPOETIN ALFA 40 MCG/0.4ML IJ SOSY: 40.0000 ug | PREFILLED_SYRINGE | INTRAMUSCULAR | Status: DC

## 2018-04-07 MED ORDER — DARBEPOETIN ALFA 60 MCG/0.3ML IJ SOSY
60.0000 ug | PREFILLED_SYRINGE | Freq: Once | INTRAMUSCULAR | Status: AC
  Administered 2018-04-07: 60 ug via SUBCUTANEOUS

## 2018-04-07 NOTE — Progress Notes (Signed)
Pt's hemocue is 7.1.  Office notified. Orders received from Dr Anwar Pronto.  Aranesp dose increased to 20mcg every 2 weeks.  Fe studies obtained.  Pt and wife verbalize understanding

## 2018-04-08 ENCOUNTER — Telehealth: Payer: Self-pay

## 2018-04-08 ENCOUNTER — Other Ambulatory Visit: Payer: Self-pay | Admitting: Hematology and Oncology

## 2018-04-08 DIAGNOSIS — N189 Chronic kidney disease, unspecified: Secondary | ICD-10-CM

## 2018-04-08 DIAGNOSIS — N184 Chronic kidney disease, stage 4 (severe): Secondary | ICD-10-CM | POA: Diagnosis not present

## 2018-04-08 DIAGNOSIS — J449 Chronic obstructive pulmonary disease, unspecified: Secondary | ICD-10-CM | POA: Diagnosis not present

## 2018-04-08 DIAGNOSIS — C169 Malignant neoplasm of stomach, unspecified: Secondary | ICD-10-CM | POA: Diagnosis not present

## 2018-04-08 DIAGNOSIS — C801 Malignant (primary) neoplasm, unspecified: Secondary | ICD-10-CM

## 2018-04-08 DIAGNOSIS — C163 Malignant neoplasm of pyloric antrum: Secondary | ICD-10-CM

## 2018-04-08 DIAGNOSIS — N179 Acute kidney failure, unspecified: Secondary | ICD-10-CM

## 2018-04-08 DIAGNOSIS — I509 Heart failure, unspecified: Secondary | ICD-10-CM | POA: Diagnosis not present

## 2018-04-08 LAB — POCT HEMOGLOBIN-HEMACUE: Hemoglobin: 7.1 g/dL — ABNORMAL LOW (ref 13.0–17.0)

## 2018-04-08 NOTE — Telephone Encounter (Signed)
Called and talked with Mardene Celeste with transportation at [facility]. They will transport patient on 3/11 and arrive at 1030 for appts.

## 2018-04-08 NOTE — Telephone Encounter (Signed)
-----   Message from Heath Lark, MD sent at 04/08/2018  9:41 AM EST ----- Regarding: return appt He is recently diagnosed with stomach cancer I can see him next Tuesday or Wednesday morning with labs Needs 30 mins appt He needs to come in with his sister. Patient has very poor understanding

## 2018-04-08 NOTE — Telephone Encounter (Signed)
Called sister with below message. She verbalized understanding. Leonard [name] is at [place] and rehab. Given appt at 1045 on 3/11 for and lab then see [name]. Sister will come to appt.  Called [facility] at [phone] and arranged transport for 3/11. Left a message with appt details and ask for a call back.

## 2018-04-14 ENCOUNTER — Inpatient Hospital Stay: Payer: No Typology Code available for payment source

## 2018-04-14 ENCOUNTER — Other Ambulatory Visit: Payer: Self-pay

## 2018-04-14 ENCOUNTER — Telehealth: Payer: Self-pay | Admitting: Hematology and Oncology

## 2018-04-14 ENCOUNTER — Encounter: Payer: Self-pay | Admitting: Hematology and Oncology

## 2018-04-14 ENCOUNTER — Inpatient Hospital Stay
Payer: No Typology Code available for payment source | Attending: Hematology and Oncology | Admitting: Hematology and Oncology

## 2018-04-14 VITALS — BP 155/53 | HR 91 | Temp 98.1°F | Resp 18

## 2018-04-14 DIAGNOSIS — I129 Hypertensive chronic kidney disease with stage 1 through stage 4 chronic kidney disease, or unspecified chronic kidney disease: Secondary | ICD-10-CM | POA: Diagnosis not present

## 2018-04-14 DIAGNOSIS — I2510 Atherosclerotic heart disease of native coronary artery without angina pectoris: Secondary | ICD-10-CM | POA: Diagnosis not present

## 2018-04-14 DIAGNOSIS — L989 Disorder of the skin and subcutaneous tissue, unspecified: Secondary | ICD-10-CM | POA: Insufficient documentation

## 2018-04-14 DIAGNOSIS — R1012 Left upper quadrant pain: Secondary | ICD-10-CM | POA: Diagnosis not present

## 2018-04-14 DIAGNOSIS — Z87891 Personal history of nicotine dependence: Secondary | ICD-10-CM

## 2018-04-14 DIAGNOSIS — N189 Chronic kidney disease, unspecified: Secondary | ICD-10-CM

## 2018-04-14 DIAGNOSIS — R918 Other nonspecific abnormal finding of lung field: Secondary | ICD-10-CM | POA: Insufficient documentation

## 2018-04-14 DIAGNOSIS — C163 Malignant neoplasm of pyloric antrum: Secondary | ICD-10-CM

## 2018-04-14 DIAGNOSIS — I714 Abdominal aortic aneurysm, without rupture: Secondary | ICD-10-CM | POA: Diagnosis not present

## 2018-04-14 DIAGNOSIS — D509 Iron deficiency anemia, unspecified: Secondary | ICD-10-CM | POA: Diagnosis not present

## 2018-04-14 DIAGNOSIS — Z794 Long term (current) use of insulin: Secondary | ICD-10-CM

## 2018-04-14 DIAGNOSIS — C801 Malignant (primary) neoplasm, unspecified: Secondary | ICD-10-CM

## 2018-04-14 DIAGNOSIS — N184 Chronic kidney disease, stage 4 (severe): Secondary | ICD-10-CM | POA: Diagnosis not present

## 2018-04-14 DIAGNOSIS — E1122 Type 2 diabetes mellitus with diabetic chronic kidney disease: Secondary | ICD-10-CM | POA: Insufficient documentation

## 2018-04-14 DIAGNOSIS — N62 Hypertrophy of breast: Secondary | ICD-10-CM | POA: Diagnosis not present

## 2018-04-14 DIAGNOSIS — K922 Gastrointestinal hemorrhage, unspecified: Secondary | ICD-10-CM

## 2018-04-14 DIAGNOSIS — D539 Nutritional anemia, unspecified: Secondary | ICD-10-CM

## 2018-04-14 DIAGNOSIS — Z7982 Long term (current) use of aspirin: Secondary | ICD-10-CM | POA: Insufficient documentation

## 2018-04-14 DIAGNOSIS — D631 Anemia in chronic kidney disease: Secondary | ICD-10-CM | POA: Insufficient documentation

## 2018-04-14 DIAGNOSIS — Z79899 Other long term (current) drug therapy: Secondary | ICD-10-CM | POA: Insufficient documentation

## 2018-04-14 LAB — CBC WITH DIFFERENTIAL/PLATELET
Abs Immature Granulocytes: 0.06 10*3/uL (ref 0.00–0.07)
Basophils Absolute: 0 10*3/uL (ref 0.0–0.1)
Basophils Relative: 0 %
EOS ABS: 0.5 10*3/uL (ref 0.0–0.5)
Eosinophils Relative: 8 %
HCT: 23.5 % — ABNORMAL LOW (ref 39.0–52.0)
Hemoglobin: 7.2 g/dL — ABNORMAL LOW (ref 13.0–17.0)
Immature Granulocytes: 1 %
Lymphocytes Relative: 12 %
Lymphs Abs: 0.7 10*3/uL (ref 0.7–4.0)
MCH: 28.1 pg (ref 26.0–34.0)
MCHC: 30.6 g/dL (ref 30.0–36.0)
MCV: 91.8 fL (ref 80.0–100.0)
Monocytes Absolute: 0.6 10*3/uL (ref 0.1–1.0)
Monocytes Relative: 10 %
NEUTROS ABS: 4.3 10*3/uL (ref 1.7–7.7)
Neutrophils Relative %: 69 %
Platelets: 194 10*3/uL (ref 150–400)
RBC: 2.56 MIL/uL — ABNORMAL LOW (ref 4.22–5.81)
RDW: 15.6 % — AB (ref 11.5–15.5)
WBC: 6.2 10*3/uL (ref 4.0–10.5)
nRBC: 0 % (ref 0.0–0.2)

## 2018-04-14 LAB — VITAMIN B12: Vitamin B-12: 236 pg/mL (ref 180–914)

## 2018-04-14 LAB — IRON AND TIBC
Iron: 35 ug/dL — ABNORMAL LOW (ref 42–163)
Saturation Ratios: 18 % — ABNORMAL LOW (ref 20–55)
TIBC: 195 ug/dL — ABNORMAL LOW (ref 202–409)
UIBC: 160 ug/dL (ref 117–376)

## 2018-04-14 LAB — FERRITIN: Ferritin: 535 ng/mL — ABNORMAL HIGH (ref 24–336)

## 2018-04-14 LAB — SAMPLE TO BLOOD BANK

## 2018-04-14 NOTE — Assessment & Plan Note (Signed)
I have reviewed CT imaging and pathology report with the patient and family The patient has no signs of metastatic spread of cancer His case was recently discussed at GI tumor board The surgeon who saw him in the hospital was concerned about comorbidities and high risk of mortality We discussed potential referral to a different surgeon or second opinion at tertiary center He has appointment to meet with radiation oncologist tomorrow to discuss the role of radiation therapy I will not prescribe chemotherapy for him I explained to the patient and family that his risk of complications from chemotherapy is very high and could potentially cause permanent renal failure. I will continue to provide supportive care and transfusion only as needed.

## 2018-04-14 NOTE — Assessment & Plan Note (Signed)
He has multifactorial anemia, combination of anemia of chronic disease, chronic kidney disease and GI bleed Due to the severity of his anemia, I recommend blood transfusion We discussed some of the risks, benefits, and alternatives of blood transfusions. The patient is symptomatic from anemia and the hemoglobin level is critically low.  Some of the side-effects to be expected including risks of transfusion reactions, chills, infection, syndrome of volume overload and risk of hospitalization from various reasons and the patient is willing to proceed and went ahead to sign consent today. I recommend 2 units of blood within the next 2 days.

## 2018-04-14 NOTE — Assessment & Plan Note (Signed)
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 In the meantime, he will continue to come back here for close blood count monitoring, ESA and transfusion as needed He will continue close nephrology follow-up

## 2018-04-14 NOTE — Progress Notes (Signed)
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  Right lateral chest wall lesion is similar too on the prior exam.  Upper GI endoscopy 03/29/2018: No gross lesions in esophagus, non-bleeding erosive gastropathy.  Congested, erythematous, friable, granular inflamed and texture changed mucosa in the prepyloric region of the stomach and pylorus.  Currently non-bleeding gastric ulcer with a possible flat pigmented spot that encircled the entire pylorus.  Biopsies of Stomach 03/29/2018   Past/Anticipated interventions by surgeon, if any:   Past/Anticipated interventions by medical oncology, if any:  [name] 04/14/2018   Weight changes, if any: No, gained some.  Bowel/Bladder complaints, if any: No  Nausea / Vomiting, if any: No  Pain issues, if any: No  Appetite is getting better per patient.  BP (!) 126/56 (BP Location: Right Arm, Patient Position: Sitting)   Pulse 66   Temp 98.7 F (37.1 C) (Oral)   Resp 20   Ht 5\' 8"  (1.727 m)   SpO2 100%   BMI 26.15 kg/m    Wt Readings from Last 3 Encounters:  03/28/18 171 lb 15.3 oz (78 kg)  03/17/18 171 lb (77.6 kg)  02/16/18 175 lb 9.6 oz (79.7 kg)   SAFETY ISSUES:  Prior radiation? No  Pacemaker/ICD? No  Possible current pregnancy? No  Is the patient on methotrexate? No  Current Complaints/Details: - Lives at [facility] and rehab [phone]

## 2018-04-14 NOTE — Progress Notes (Signed)
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[place]) I have reviewed CT imaging and pathology report with the patient and family The patient has no signs of metastatic spread of cancer His case was recently discussed at GI tumor board The surgeon who saw him in the hospital was concerned about comorbidities and high risk of mortality We discussed potential referral to a different surgeon or second opinion at tertiary center He has appointment to meet with radiation oncologist tomorrow to discuss the role of radiation therapy I will not prescribe chemotherapy for him I explained to the patient and family that his risk of complications from chemotherapy is very high and could potentially cause permanent renal failure. I will continue to provide supportive care and transfusion only as needed.   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 The patient is symptomatic from anemia and the hemoglobin level is critically low.  Some of the side-effects to be expected including risks of transfusion reactions, chills, infection, syndrome of volume overload and risk of hospitalization from various reasons and the patient is willing to proceed and went ahead to sign consent today. I recommend 2 units of blood within the next 2 days.  CKD (chronic kidney disease) stage 4, GFR 15-29 ml/min (HCC) The patient has significant fluctuation of his serum creatinine, between chronic kidney disease stage III to stage IV. In the meantime, he will continue to come back here for close blood count  monitoring, ESA and transfusion as needed He will continue close nephrology follow-up   No orders of the defined types were placed in this encounter.   INTERVAL HISTORY: Please see below for problem oriented charting. The patient is well-known to me. His niece, Karie Kirks and his sister, [name] are present.  He lives with [name], another sister He was found to have stomach cancer from recent hospitalization.  The patient had background history of diabetes, severe peripheral vascular disease leading to left lower extremity amputation and chronic kidney disease stage IV After recent hospitalization, his case was discussed at the GI tumor board last week He returns today for further discussion about plan of care His appetite has improved He denies abdominal pain no nausea The patient denies any recent signs or symptoms of bleeding such as spontaneous epistaxis, hematuria or hematochezia.   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 Recommend followup by ultrasound in 3 years.  Large stool burden throughout the colon. No acute findings in the abdomen or pelvis.    03/29/2018 Procedure    EGD - No gross lesions in esophagus. - Non-bleeding erosive gastropathy. - Congested, erythematous, friable (with contact bleeding), granular, inflamed and texture changed mucosa in the prepyloric region of the stomach and pylorus. - Currently non-bleeding gastric ulcer with a possible flat pigmented spot (Forrest Class IIc) that encircled the entire pylorus. Biopsied. - No other gross lesions in the stomach. Biopsied for HP. - Erythematous duodenopathy and melanosis. - Normal mucosa was found in the second portion of the duodenum.  03/29/2018 Pathology Results    1. Stomach, biopsy, Gastric - ADENOCARCINOMA. - HIGH GRADE GLANDULAR DYSPLASIA AND INTESTINAL METAPLASIA. -  WARTHIN-STARRY STAIN NEGATIVE FOR HELICOBACTER PYLORI. 2. Stomach, biopsy - ANTRAL AND OXYNTIC MUCOSA WITH SLIGHT INFLAMMATION AND INTESTINAL METAPLASIA. - WARTHIN-STARRY STAIN NEGATIVE FOR HELICOBACTER PYLORI. - NO DYSPLASIA OR MALIGNANCY.    03/31/2018 Imaging    Ct chest 1. A posterior left upper lobe 6 mm nodule is indeterminate, but felt to be similar on 07/11/2017 (both prior chest CTs are motion degraded). This could be re-evaluated at follow-up. 2. Otherwise, no evidence of metastatic disease in the chest. 3.  Aortic Atherosclerosis (ICD10-I70.0). 4. Bilateral gynecomastia. 5. Resolved bilateral pleural fluid and airspace disease since 08/26/2017. 6. Pulmonary artery enlargement suggests pulmonary arterial hypertension. 7. Right lateral chest wall lesion is similar to on the prior. Consider physical exam correlation.    04/14/2018 Cancer Staging    Staging form: Stomach, AJCC 8th Edition - Clinical: Stage I (cT1, cN0, cM0) - Signed by Heath Lark, MD on 04/14/2018     REVIEW OF SYSTEMS:   Constitutional: Denies fevers, chills or abnormal weight loss Eyes: Denies blurriness of vision Ears, nose, mouth, throat, and face: Denies mucositis or sore throat Respiratory: Denies cough, dyspnea or wheezes Cardiovascular: Denies palpitation, chest discomfort or lower extremity swelling Gastrointestinal:  Denies nausea, heartburn or change in bowel habits Skin: Denies abnormal skin rashes Lymphatics: Denies new lymphadenopathy or easy bruising Neurological:Denies numbness, tingling or new weaknesses Behavioral/Psych: Mood is stable, no new changes  All other systems were reviewed with the patient and are negative.  I have reviewed the past medical history, past surgical history, social history and family history with the patient and they are unchanged from previous note.  ALLERGIES:  is allergic to penicillins.  MEDICATIONS:  Current Outpatient Medications  Medication Sig Dispense  Refill  . acetaminophen (TYLENOL) 325 MG tablet Take 650 mg by mouth every 8 (eight) hours as needed for mild pain or fever.     Marland Kitchen amLODipine (NORVASC) 5 MG tablet Take 1 tablet (5 mg total) by mouth daily. 30 tablet 0  . aspirin EC 81 MG tablet Take 1 tablet (81 mg total) by mouth daily. 150 tablet 0  . atorvastatin (LIPITOR) 40 MG tablet TAKE ONE (1) TABLET BY MOUTH EVERY DAY AT 6:00PM (Patient taking differently: Take 40 mg by mouth daily at 6 PM. ) 90 tablet 3  . Blood Glucose Monitoring Suppl (ONETOUCH VERIO FLEX SYSTEM) w/Device KIT 1 kit by Does not apply route daily. 1 kit 3  . carvedilol (COREG) 12.5 MG tablet Take 1 tablet (12.5 mg total) by mouth 2 (two) times daily with a meal. 60 tablet 0  . cloNIDine (CATAPRES - DOSED IN MG/24 HR) 0.2 mg/24hr patch Place 1 patch (0.2 mg total) onto the skin once a week. (Patient taking differently: Place 0.2 mg onto the skin every Saturday. ) 4 patch 0  . Darbepoetin Alfa (ARANESP) 40 MCG/0.4ML SOSY injection Inject 0.4 mLs (40 mcg total) into the skin every 30 (thirty) days. 8.4 mL 2  . doxazosin (CARDURA) 8 MG tablet Take 1 tablet (8 mg total) by mouth every evening.    . feeding supplement, ENSURE ENLIVE, (ENSURE ENLIVE) LIQD Take 237 mLs by mouth 3 (three) times daily between meals. (Patient taking differently: Take 237 mLs by mouth 2 (two) times daily between meals. ) 237 mL 12  . ferrous sulfate (FEROSUL) 325 (65 FE) MG tablet Take 1 tablet (325 mg total) by  mouth daily at 12 noon. (Patient taking differently: Take 325 mg by mouth 2 (two) times daily with a meal. ) 30 tablet 0  . glucose blood (ONETOUCH VERIO) test strip Use as instructed to test three times daily. ICD-10 code: E11.22 100 each 12  . hydrALAZINE (APRESOLINE) 100 MG tablet TAKE ONE TABLET BY MOUTH EVERY EIGHT HOURS (Patient taking differently: Take 100 mg by mouth every 8 (eight) hours. ) 90 tablet 1  . insulin aspart (NOVOLOG) 100 UNIT/ML injection Inject 0-10 Units into the skin See  admin instructions. Three times daily per sliding scale: <150 = 0 units 151-200 = 2 units 201-250 = 4 units 251-300 = 6 units 301-350 = 8 units 351-400 = 10 units Call PCP if CBG <70 or >400    . ipratropium-albuterol (DUONEB) 0.5-2.5 (3) MG/3ML SOLN Take 3 mLs by nebulization every 6 (six) hours as needed (shortness of breath and wheezing).     . isosorbide mononitrate (IMDUR) 120 MG 24 hr tablet Take 1 tablet (120 mg total) by mouth daily. 30 tablet 0  . levothyroxine (SYNTHROID, LEVOTHROID) 100 MCG tablet Take 100 mcg by mouth daily before breakfast.     . LONHALA MAGNAIR STARTER KIT 25 MCG/ML SOLN Inhale 1 mL into the lungs 2 (two) times daily.    . nitroGLYCERIN (NITROSTAT) 0.4 MG SL tablet Place 1 tablet (0.4 mg total) under the tongue every 5 (five) minutes as needed for chest pain. 30 tablet 12  . ONETOUCH DELICA LANCETS 59D MISC 1 each by Does not apply route 3 (three) times daily. ICD-10 code: E11.22 100 each 12  . pantoprazole (PROTONIX) 40 MG tablet Take 1 tablet (40 mg total) by mouth 2 (two) times daily.    . polyethylene glycol (MIRALAX / GLYCOLAX) packet Take 17 g by mouth daily as needed for mild constipation or moderate constipation. (Patient taking differently: Take 17 g by mouth daily as needed for mild constipation. ) 14 each 0  . potassium chloride SA (K-DUR,KLOR-CON) 20 MEQ tablet Take 2 tablets (40 mEq total) by mouth daily. 60 tablet 11  . torsemide (DEMADEX) 20 MG tablet Take 2 tablets (40 mg total) by mouth daily. (Patient taking differently: Take 80 mg by mouth daily. ) 120 tablet 11   No current facility-administered medications for this visit.    Facility-Administered Medications Ordered in Other Visits  Medication Dose Route Frequency Provider Last Rate Last Dose  . sodium chloride flush (NS) 0.9 % injection 10 mL  10 mL Intracatheter PRN Alvy Bimler, Rateel Beldin, MD        PHYSICAL EXAMINATION: ECOG PERFORMANCE STATUS: 2 - Symptomatic, <50% confined to bed  Vitals:    04/14/18 1128  BP: (!) 155/53  Pulse: 91  Resp: 18  Temp: 98.1 F (36.7 C)  SpO2: 100%   There were no vitals filed for this visit.  GENERAL:alert, no distress and comfortable SKIN: skin color is pale, texture, turgor are normal, no rashes or significant lesions EYES: normal, Conjunctiva are pale and non-injected, sclera clear OROPHARYNX:no exudate, no erythema and lips, buccal mucosa, and tongue normal  NECK: supple, thyroid normal size, non-tender, without nodularity LYMPH:  no palpable lymphadenopathy in the cervical, axillary or inguinal LUNGS: clear to auscultation and percussion with normal breathing effort HEART: regular rate & rhythm and no murmurs and no lower extremity edema ABDOMEN:abdomen soft, non-tender and normal bowel sounds Musculoskeletal:no cyanosis of digits and no clubbing  NEURO: alert & oriented x 3 with fluent speech, no focal motor/sensory deficits  LABORATORY DATA:  I have reviewed the data as listed    Component Value Date/Time   NA 141 04/02/2018 0458   NA 139 02/11/2017 1220   NA 140 08/16/2015 1554   K 4.2 04/02/2018 0458   K 3.8 08/16/2015 1554   CL 115 (H) 04/02/2018 0458   CO2 18 (L) 04/02/2018 0458   CO2 21 (L) 08/16/2015 1554   GLUCOSE 131 (H) 04/02/2018 0458   GLUCOSE 133 08/16/2015 1554   BUN 51 (H) 04/02/2018 0458   BUN 54 (H) 02/11/2017 1220   BUN 51.0 (H) 08/16/2015 1554   CREATININE 2.54 (H) 04/02/2018 0458   CREATININE 3.64 (H) 01/09/2016 1606   CREATININE 2.7 (H) 08/16/2015 1554   CALCIUM 8.1 (L) 04/02/2018 0458   CALCIUM NOPLAS 10/24/2015 1415   CALCIUM 8.9 08/16/2015 1554   PROT 5.9 (L) 03/29/2018 0500   PROT 6.6 01/16/2017 1339   PROT 7.0 08/16/2015 1554   ALBUMIN 3.1 (L) 03/29/2018 0500   ALBUMIN 3.4 (L) 08/16/2015 1554   AST 12 (L) 03/29/2018 0500   AST 17 08/16/2015 1554   ALT 12 03/29/2018 0500   ALT 13 08/16/2015 1554   ALKPHOS 74 03/29/2018 0500   ALKPHOS 89 08/16/2015 1554   BILITOT 0.8 03/29/2018 0500    BILITOT 0.30 08/16/2015 1554   GFRNONAA 25 (L) 04/02/2018 0458   GFRNONAA 27 (L) 07/20/2015 0949   GFRAA 29 (L) 04/02/2018 0458   GFRAA 31 (L) 07/20/2015 0949    No results found for: SPEP, UPEP  Lab Results  Component Value Date   WBC 6.2 04/14/2018   NEUTROABS 4.3 04/14/2018   HGB 7.2 (L) 04/14/2018   HCT 23.5 (L) 04/14/2018   MCV 91.8 04/14/2018   PLT 194 04/14/2018      Chemistry      Component Value Date/Time   NA 141 04/02/2018 0458   NA 139 02/11/2017 1220   NA 140 08/16/2015 1554   K 4.2 04/02/2018 0458   K 3.8 08/16/2015 1554   CL 115 (H) 04/02/2018 0458   CO2 18 (L) 04/02/2018 0458   CO2 21 (L) 08/16/2015 1554   BUN 51 (H) 04/02/2018 0458   BUN 54 (H) 02/11/2017 1220   BUN 51.0 (H) 08/16/2015 1554   CREATININE 2.54 (H) 04/02/2018 0458   CREATININE 3.64 (H) 01/09/2016 1606   CREATININE 2.7 (H) 08/16/2015 1554      Component Value Date/Time   CALCIUM 8.1 (L) 04/02/2018 0458   CALCIUM NOPLAS 10/24/2015 1415   CALCIUM 8.9 08/16/2015 1554   ALKPHOS 74 03/29/2018 0500   ALKPHOS 89 08/16/2015 1554   AST 12 (L) 03/29/2018 0500   AST 17 08/16/2015 1554   ALT 12 03/29/2018 0500   ALT 13 08/16/2015 1554   BILITOT 0.8 03/29/2018 0500   BILITOT 0.30 08/16/2015 1554       RADIOGRAPHIC STUDIES: I have reviewed imaging studies with the patient and family I have personally reviewed the radiological images as listed and agreed with the findings in the report. Ct Abdomen Pelvis Wo Contrast  Result Date: 03/28/2018 CLINICAL DATA:  Nausea, vomiting EXAM: CT ABDOMEN AND PELVIS WITHOUT CONTRAST TECHNIQUE: Multidetector CT imaging of the abdomen and pelvis was performed following the standard protocol without IV contrast. COMPARISON:  None. FINDINGS: Lower chest: No confluent opacities or effusions. Hepatobiliary: No focal hepatic abnormality. Gallbladder unremarkable. Pancreas: No focal abnormality or ductal dilatation. Spleen: Calcifications in the spleen.  Normal size.  Adrenals/Urinary Tract: Renovascular calcifications. No urinary tract stones or  hydronephrosis. Exophytic low-density lesion off the lower pole of the left kidney likely reflects a cyst although this cannot be fully characterized on this unenhanced study. This measures 2 cm. Adrenal glands and urinary bladder unremarkable. Stomach/Bowel: Large stool burden throughout the colon. Normal appendix. Stomach and small bowel grossly unremarkable unenhanced appearance. Vascular/Lymphatic: 3.3 cm infrarenal abdominal aortic aneurysm extending into both common iliac arteries which measure 2.4 cm on the right and 2.2 cm on the left. No adenopathy. Reproductive: No visible focal abnormality. Other: No free fluid or free air. Musculoskeletal: No acute bony abnormality. IMPRESSION: Infrarenal abdominal aortic aneurysm, 3.3 cm maximally. Recommend followup by ultrasound in 3 years. This recommendation follows ACR consensus guidelines: White Paper of the ACR Incidental Findings Committee II on Vascular Findings. J Am Coll Radiol 2013; 10:789-794. Aortic aneurysm NOS (ICD10-I71.9) Large stool burden throughout the colon. No acute findings in the abdomen or pelvis. Electronically Signed   By: Rolm Baptise M.D.   On: 03/28/2018 21:47   Dg Chest 2 View  Result Date: 03/28/2018 CLINICAL DATA:  Shortness of breath EXAM: CHEST - 2 VIEW COMPARISON:  09/23/2017 FINDINGS: Cardiomegaly. Prior CABG. No confluent airspace opacities, effusions or edema. No acute bony abnormality. IMPRESSION: Cardiomegaly.  No active disease. Electronically Signed   By: Rolm Baptise M.D.   On: 03/28/2018 20:07   Ct Chest Wo Contrast  Result Date: 03/31/2018 CLINICAL DATA:  Gastric cancer. Staging. Asymptomatic. COPD. Ex-smoker, quitting 5 years ago. EXAM: CT CHEST WITHOUT CONTRAST TECHNIQUE: Multidetector CT imaging of the chest was performed following the standard protocol without IV contrast. COMPARISON:  Plain film chest 03/28/2018. Most recent chest  CT 08/26/2017. FINDINGS: Cardiovascular: Advanced aortic and branch vessel atherosclerosis. Mild cardiomegaly, without pericardial effusion. Native coronary artery calcification, status post median sternotomy for CABG. Pulmonary artery enlargement, outflow tract 3.2 cm. Mediastinum/Nodes: No mediastinal or definite hilar adenopathy, given limitations of unenhanced CT. Lungs/Pleura: Resolved pleural fluid since the prior CT. Minimal motion degradation inferiorly. Left upper lobe 6 mm pulmonary nodule on image 43/4. The most recent exam was motion degraded. On a mildly motion degraded exam of 07/11/2017, this felt to be similar (image 32 of that exam). Upper Abdomen: Normal imaged portions of the liver, spleen, stomach, pancreas, gallbladder, adrenal glands, right kidney. Musculoskeletal: Marked bilateral gynecomastia. A right chest wall skin lesion of 9 mm on image 11/3 is similar on 08/26/17. No acute osseous abnormality. IMPRESSION: 1. A posterior left upper lobe 6 mm nodule is indeterminate, but felt to be similar on 07/11/2017 (both prior chest CTs are motion degraded). This could be re-evaluated at follow-up. 2. Otherwise, no evidence of metastatic disease in the chest. 3.  Aortic Atherosclerosis (ICD10-I70.0). 4. Bilateral gynecomastia. 5. Resolved bilateral pleural fluid and airspace disease since 08/26/2017. 6. Pulmonary artery enlargement suggests pulmonary arterial hypertension. 7. Right lateral chest wall lesion is similar to on the prior. Consider physical exam correlation. Electronically Signed   By: Abigail Miyamoto M.D.   On: 03/31/2018 14:42   Dg Abd 2 Views  Result Date: 03/29/2018 CLINICAL DATA:  Acute left upper quadrant abdominal pain. EXAM: ABDOMEN - 2 VIEW COMPARISON:  CT scan of March 28, 2018. Radiograph of July 10, 2017. FINDINGS: The bowel gas pattern is normal. There is no evidence of free air. No radio-opaque calculi or other significant radiographic abnormality is seen. IMPRESSION: No  evidence of bowel obstruction or ileus. Electronically Signed   By: Marijo Conception, M.D.   On: 03/29/2018 16:38    All  questions were answered. The patient knows to call the clinic with any problems, questions or concerns. No barriers to learning was detected.  I spent 40 minutes counseling the patient face to face. The total time spent in the appointment was 55 minutes and more than 50% was on counseling and review of test results  Heath Lark, MD 04/14/2018 12:09 PM

## 2018-04-14 NOTE — Telephone Encounter (Signed)
Gave avs and calendar ° °

## 2018-04-15 ENCOUNTER — Ambulatory Visit
Admission: RE | Admit: 2018-04-15 | Discharge: 2018-04-15 | Disposition: A | Discharge status: Home / Self Care | Payer: No Typology Code available for payment source | Source: Ambulatory Visit | Attending: Radiation Oncology | Admitting: Radiation Oncology

## 2018-04-15 ENCOUNTER — Telehealth: Payer: Self-pay | Admitting: *Deleted

## 2018-04-15 ENCOUNTER — Encounter: Payer: Self-pay | Admitting: Radiation Oncology

## 2018-04-15 ENCOUNTER — Other Ambulatory Visit: Payer: Self-pay

## 2018-04-15 VITALS — BP 126/56 | HR 66 | Temp 98.7°F | Resp 20 | Ht 68.0 in

## 2018-04-15 DIAGNOSIS — Z7901 Long term (current) use of anticoagulants: Secondary | ICD-10-CM | POA: Insufficient documentation

## 2018-04-15 DIAGNOSIS — I2721 Secondary pulmonary arterial hypertension: Secondary | ICD-10-CM | POA: Insufficient documentation

## 2018-04-15 DIAGNOSIS — Z86718 Personal history of other venous thrombosis and embolism: Secondary | ICD-10-CM | POA: Insufficient documentation

## 2018-04-15 DIAGNOSIS — I714 Abdominal aortic aneurysm, without rupture: Secondary | ICD-10-CM | POA: Insufficient documentation

## 2018-04-15 DIAGNOSIS — E1151 Type 2 diabetes mellitus with diabetic peripheral angiopathy without gangrene: Secondary | ICD-10-CM | POA: Insufficient documentation

## 2018-04-15 DIAGNOSIS — I5032 Chronic diastolic (congestive) heart failure: Secondary | ICD-10-CM | POA: Diagnosis not present

## 2018-04-15 DIAGNOSIS — J449 Chronic obstructive pulmonary disease, unspecified: Secondary | ICD-10-CM | POA: Insufficient documentation

## 2018-04-15 DIAGNOSIS — Z79899 Other long term (current) drug therapy: Secondary | ICD-10-CM | POA: Insufficient documentation

## 2018-04-15 DIAGNOSIS — Z794 Long term (current) use of insulin: Secondary | ICD-10-CM | POA: Insufficient documentation

## 2018-04-15 DIAGNOSIS — I130 Hypertensive heart and chronic kidney disease with heart failure and stage 1 through stage 4 chronic kidney disease, or unspecified chronic kidney disease: Secondary | ICD-10-CM | POA: Insufficient documentation

## 2018-04-15 DIAGNOSIS — I2510 Atherosclerotic heart disease of native coronary artery without angina pectoris: Secondary | ICD-10-CM | POA: Diagnosis not present

## 2018-04-15 DIAGNOSIS — Z87891 Personal history of nicotine dependence: Secondary | ICD-10-CM | POA: Insufficient documentation

## 2018-04-15 DIAGNOSIS — J90 Pleural effusion, not elsewhere classified: Secondary | ICD-10-CM | POA: Insufficient documentation

## 2018-04-15 DIAGNOSIS — E782 Mixed hyperlipidemia: Secondary | ICD-10-CM | POA: Insufficient documentation

## 2018-04-15 DIAGNOSIS — Z7982 Long term (current) use of aspirin: Secondary | ICD-10-CM | POA: Insufficient documentation

## 2018-04-15 DIAGNOSIS — E039 Hypothyroidism, unspecified: Secondary | ICD-10-CM | POA: Insufficient documentation

## 2018-04-15 DIAGNOSIS — E1140 Type 2 diabetes mellitus with diabetic neuropathy, unspecified: Secondary | ICD-10-CM | POA: Insufficient documentation

## 2018-04-15 DIAGNOSIS — N62 Hypertrophy of breast: Secondary | ICD-10-CM | POA: Insufficient documentation

## 2018-04-15 DIAGNOSIS — N184 Chronic kidney disease, stage 4 (severe): Secondary | ICD-10-CM | POA: Diagnosis not present

## 2018-04-15 DIAGNOSIS — D631 Anemia in chronic kidney disease: Secondary | ICD-10-CM | POA: Insufficient documentation

## 2018-04-15 DIAGNOSIS — R911 Solitary pulmonary nodule: Secondary | ICD-10-CM | POA: Insufficient documentation

## 2018-04-15 DIAGNOSIS — C163 Malignant neoplasm of pyloric antrum: Secondary | ICD-10-CM

## 2018-04-15 DIAGNOSIS — Z85828 Personal history of other malignant neoplasm of skin: Secondary | ICD-10-CM | POA: Insufficient documentation

## 2018-04-15 DIAGNOSIS — Z801 Family history of malignant neoplasm of trachea, bronchus and lung: Secondary | ICD-10-CM | POA: Insufficient documentation

## 2018-04-15 LAB — ERYTHROPOIETIN: Erythropoietin: 50.5 m[IU]/mL — ABNORMAL HIGH (ref 2.6–18.5)

## 2018-04-15 NOTE — Telephone Encounter (Signed)
Called patient's nieceLeshawn Straka to inform of consult with [name] on 05-24-18 - arrival time - 8:30 am, spoke with [facility] and she is aware of this appt.

## 2018-04-15 NOTE — Progress Notes (Signed)
Radiation Oncology         [phone] ________________________________  Name: Leonard [name]        MRN: [id number]  Date of Service: 04/15/2018 DOB: 1950-04-13  CC:Kathrene Alu, MD  Heath Lark, MD     REFERRING PHYSICIAN: Heath Lark, MD   DIAGNOSIS: The encounter diagnosis was 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[place]).   HISTORY OF PRESENT ILLNESS: Leonard [name] is a 68 y.o. male seen at the request of [name] for a newly diagnosed adenocarcinoma of the gastric antrum. The patient has multiple medical comorbidities and has been residing in a SNF given these issues. He presented on 03/28/2018 to [facility] ED after an episode of progressive weakness and hematemesis with epigastric pain. He proceeded with EGD on 03/29/2018 with Dr. Rush Landmark and this revealed a large ulcerated lesion in the gastric antrum at the level of the pylorus measruing about 25 mm. He had stigmata of bleeding noted from this site and this partially obstructed the stomach. A biopsy of the site revealed adenocarcinoma and high grade dysplastic findings and intestinal metaplasia, negative for H Pylori. He had CT imaging of the C/A/P while he was hospitalized that did not reveal concerns for any metastatic disease. He met with oncology and [name] does not feel that he's a good candidate for chemotherapy. He has not met with [name] but may possibly be a candidate for surgery. He has not been scheduled for PET imaging.    PREVIOUS RADIATION THERAPY: No   PAST MEDICAL HISTORY:  Past Medical History:  Diagnosis Date   Acute on chronic diastolic heart failure ([place]) 07/04/2014   Anemia of renal disease    Atherosclerotic peripheral vascular disease with gangrene ([place]) 02/14/2017   CAD (coronary artery disease) 04/06/2013   Cancer (HCC)    skin cancer-   CHF (congestive heart failure) (HCC)    Chronic anemia 02/28/2016   Chronic deep vein thrombosis (DVT) of femoral vein of left lower  extremity ([place]) 09/03/2016   Chronic respiratory failure with hypoxia ([place]) 08/25/2017   CKD (chronic kidney disease) stage 4, GFR 15-29 ml/min ([place]) 01/15/2015   Follows with  [facility].  Last visits:  07/23/16: No change in meds. Worried that if we increase diuretics could lead to another AKI   CKD (chronic kidney disease), stage IV ([place])    07/07/2017   Claudication ([place])    Collapse of left talus 01/14/2017   COPD GOLD III  02/16/2014   Quit smoking 2014  - PFT's  04/06/13   FEV1 1.28 (44 % ) ratio 48  p no % improvement from saba p ?  prior to study with DLCO  43/54 % corrects to 97  % for alv volume - 01/31/2016  After extensive coaching HFA effectiveness =    75% from baseline 0 > changed to sprivia respimat   - PFT's  03/17/2016  FEV1 2.25  (81 % ) ratio 88  p 2 % improvement from saba p spiriva respimat x 2  prior to study with   Coronary artery disease    Diabetes ([place])    Diabetic polyneuropathy associated with type 2 diabetes mellitus ([place]) 01/14/2017   Diabetic retinopathy ([place]) 11/13/2015   Per Dr. Erenest Rasher eye exam in 10/17/15, mild background diabetic retinopathy   Fatigue associated with anemia 01/21/2016   Gangrene of toe ([place])    right 5th/notes 01/04/2013    HCAP (healthcare-associated pneumonia)    Hematuria 06/27/2015   High cholesterol  History of tobacco use 08/23/2013   Smoked pack per day for 46 years Quit smoking - 02/03/2013    Hypertension    Hypothyroidism 11/22/2014   Idiopathic chronic gout of left foot without tophus    Iron deficiency anemia 08/03/2015   Left foot pain 10/29/2016   Long term (current) use of anticoagulants [Z79.01] 09/09/2016   MGUS (monoclonal gammopathy of unknown significance) 01/15/2015   Mixed hyperlipidemia 07/17/2008   Morbid obesity due to excess calories ([place]) 03/17/2016   PAD (peripheral artery disease) (HCC)    Pleural effusion    Pneumonitis 07/26/2017   Pulmonary artery hypertension ([place]) 07/25/2017   PVD  - hx of Rt SFA PTA and s/p Rt 4-5th toe amp Dec 2014 04/05/2013   Resistant hypertension 11/25/2012   Respiratory failure ([place]) 08/13/2017   Respiratory failure with hypoxia ([place]) 09/23/2017   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   Secondary hypertension    Shortness of breath dyspnea    with exertion   Skin lesion of face 02/22/2015   SOB (shortness of breath)    Type 2 diabetes mellitus with pressure callus ([place]) 12/29/2016   Type II diabetes mellitus (HCC)    Type II   Type II diabetes mellitus with complication ([place])        PAST SURGICAL HISTORY: Past Surgical History:  Procedure Laterality Date   ABDOMINAL AORTOGRAM W/LOWER EXTREMITY N/A 03/12/2017   Procedure: ABDOMINAL AORTOGRAM W/LOWER EXTREMITY;  Surgeon: Conrad Biola, MD;  Location: [facility];  Service: Cardiovascular;  Laterality: N/A;   AMPUTATION Right 01/28/2013   Procedure: RAY AMPUTATION RIGHT  4th & 5th TOE;  Surgeon: Rosetta Posner, MD;  Location: [facility];  Service: Vascular;  Laterality: Right;   AMPUTATION Left 03/15/2017   Procedure: AMPUTATION ABOVE KNEE;  Surgeon: Waynetta Sandy, MD;  Location: [facility];  Service: Vascular;  Laterality: Left;   ANGIOPLASTY / STENTING FEMORAL Right 01/13/2013   superficial femoral artery x 2 (3 mm x 60 mm, 4 mm x 60 mm)   AV FISTULA PLACEMENT Left 02/11/2016   Procedure: LEFT UPPER ARM BRACHIAL CEPHALIC ARTERIOVENOUS (AV) FISTULA CREATION;  Surgeon: Conrad [place], MD;  Location: [facility];  Service: Vascular;  Laterality: Left;   BIOPSY  03/29/2018   Procedure: BIOPSY;  Surgeon: Irving Copas., MD;  Location: [facility];  Service: Gastroenterology;;   COLONOSCOPY W/ POLYPECTOMY     CORONARY ARTERY BYPASS GRAFT N/A 04/07/2013   Procedure: CORONARY ARTERY BYPASS GRAFTING (CABG);  Surgeon: Ivin Poot, MD;  Location: [facility];  Service: Open Heart Surgery;  Laterality: N/A;   ESOPHAGOGASTRODUODENOSCOPY (EGD) WITH PROPOFOL N/A 03/29/2018   Procedure:  ESOPHAGOGASTRODUODENOSCOPY (EGD) WITH PROPOFOL;  Surgeon: Rush Landmark Telford Nab., MD;  Location: [facility];  Service: Gastroenterology;  Laterality: N/A;   INTRAOPERATIVE TRANSESOPHAGEAL ECHOCARDIOGRAM N/A 04/07/2013   Procedure: INTRAOPERATIVE TRANSESOPHAGEAL ECHOCARDIOGRAM;  Surgeon: Ivin Poot, MD;  Location: [facility];  Service: Open Heart Surgery;  Laterality: N/A;   IR FLUORO GUIDE CV LINE RIGHT  11/04/2017   IR REMOVAL TUN CV CATH W/O FL  11/27/2017   IR US GUIDE VASC ACCESS RIGHT  11/04/2017   LEFT HEART CATHETERIZATION WITH CORONARY ANGIOGRAM N/A 04/05/2013   Procedure: LEFT HEART CATHETERIZATION WITH CORONARY ANGIOGRAM;  Surgeon: Peter M Martinique, MD;  Location: [facility] CATH LAB;  Service: Cardiovascular;  Laterality: N/A;   LOWER EXTREMITY ANGIOGRAM Bilateral 01/06/2013   Procedure: LOWER EXTREMITY ANGIOGRAM;  Surgeon: Conrad [place], MD;  Location: [facility] CATH  LAB;  Service: Cardiovascular;  Laterality: Bilateral;   LOWER EXTREMITY ANGIOGRAM Right 01/13/2013   Procedure: LOWER EXTREMITY ANGIOGRAM;  Surgeon: Conrad [place], MD;  Location: [facility] CATH LAB;  Service: Cardiovascular;  Laterality: Right;   SKIN CANCER EXCISION  2017   THROAT SURGERY  1983   polyps   TONSILLECTOMY AND ADENOIDECTOMY  ~ 1965     FAMILY HISTORY:  Family History  Problem Relation Age of Onset   Cancer Mother        lung cancer   Hypertension Mother    Cancer Sister        patient thinks it was uterine cancer   Hypertension Sister    Heart attack Sister    Stroke Neg Hx      SOCIAL HISTORY:  reports that he quit smoking about 5 years ago. His smoking use included cigarettes. He started smoking about 51 years ago. He has a 46.00 pack-year smoking history. He has never used smokeless tobacco. He reports that he does not drink alcohol or use drugs. The patient is single and lives in [place]. He's accompanied by his sister and niece.    ALLERGIES: Penicillins   MEDICATIONS:  Current Outpatient  Medications  Medication Sig Dispense Refill   acetaminophen (TYLENOL) 325 MG tablet Take 650 mg by mouth every 8 (eight) hours as needed for mild pain or fever.      amLODipine (NORVASC) 5 MG tablet Take 1 tablet (5 mg total) by mouth daily. 30 tablet 0   aspirin EC 81 MG tablet Take 1 tablet (81 mg total) by mouth daily. 150 tablet 0   atorvastatin (LIPITOR) 40 MG tablet TAKE ONE (1) TABLET BY MOUTH EVERY DAY AT 6:00PM (Patient taking differently: Take 40 mg by mouth daily at 6 PM. ) 90 tablet 3   Blood Glucose Monitoring Suppl (ONETOUCH VERIO FLEX SYSTEM) w/Device KIT 1 kit by Does not apply route daily. 1 kit 3   carvedilol (COREG) 12.5 MG tablet Take 1 tablet (12.5 mg total) by mouth 2 (two) times daily with a meal. 60 tablet 0   cloNIDine (CATAPRES - DOSED IN MG/24 HR) 0.2 mg/24hr patch Place 1 patch (0.2 mg total) onto the skin once a week. (Patient taking differently: Place 0.2 mg onto the skin every Saturday. ) 4 patch 0   Darbepoetin Alfa (ARANESP) 40 MCG/0.4ML SOSY injection Inject 0.4 mLs (40 mcg total) into the skin every 30 (thirty) days. 8.4 mL 2   doxazosin (CARDURA) 8 MG tablet Take 1 tablet (8 mg total) by mouth every evening.     feeding supplement, ENSURE ENLIVE, (ENSURE ENLIVE) LIQD Take 237 mLs by mouth 3 (three) times daily between meals. (Patient taking differently: Take 237 mLs by mouth 2 (two) times daily between meals. ) 237 mL 12   ferrous sulfate (FEROSUL) 325 (65 FE) MG tablet Take 1 tablet (325 mg total) by mouth daily at 12 noon. (Patient taking differently: Take 325 mg by mouth 2 (two) times daily with a meal. ) 30 tablet 0   glucose blood (ONETOUCH VERIO) test strip Use as instructed to test three times daily. ICD-10 code: E11.22 100 each 12   hydrALAZINE (APRESOLINE) 100 MG tablet TAKE ONE TABLET BY MOUTH EVERY EIGHT HOURS (Patient taking differently: Take 100 mg by mouth every 8 (eight) hours. ) 90 tablet 1   insulin aspart (NOVOLOG) 100 UNIT/ML  injection Inject 0-10 Units into the skin See admin instructions. Three times daily per sliding scale: <150 = 0  units 151-200 = 2 units 201-250 = 4 units 251-300 = 6 units 301-350 = 8 units 351-400 = 10 units Call PCP if CBG <70 or >400     ipratropium-albuterol (DUONEB) 0.5-2.5 (3) MG/3ML SOLN Take 3 mLs by nebulization every 6 (six) hours as needed (shortness of breath and wheezing).      isosorbide mononitrate (IMDUR) 120 MG 24 hr tablet Take 1 tablet (120 mg total) by mouth daily. 30 tablet 0   levothyroxine (SYNTHROID, LEVOTHROID) 100 MCG tablet Take 100 mcg by mouth daily before breakfast.      LONHALA MAGNAIR STARTER KIT 25 MCG/ML SOLN Inhale 1 mL into the lungs 2 (two) times daily.     nitroGLYCERIN (NITROSTAT) 0.4 MG SL tablet Place 1 tablet (0.4 mg total) under the tongue every 5 (five) minutes as needed for chest pain. 30 tablet 12   ONETOUCH DELICA LANCETS 16X MISC 1 each by Does not apply route 3 (three) times daily. ICD-10 code: E11.22 100 each 12   pantoprazole (PROTONIX) 40 MG tablet Take 1 tablet (40 mg total) by mouth 2 (two) times daily.     polyethylene glycol (MIRALAX / GLYCOLAX) packet Take 17 g by mouth daily as needed for mild constipation or moderate constipation. (Patient taking differently: Take 17 g by mouth daily as needed for mild constipation. ) 14 each 0   potassium chloride SA (K-DUR,KLOR-CON) 20 MEQ tablet Take 2 tablets (40 mEq total) by mouth daily. 60 tablet 11   torsemide (DEMADEX) 20 MG tablet Take 2 tablets (40 mg total) by mouth daily. (Patient taking differently: Take 80 mg by mouth daily. ) 120 tablet 11   No current facility-administered medications for this encounter.    Facility-Administered Medications Ordered in Other Encounters  Medication Dose Route Frequency Provider Last Rate Last Dose   sodium chloride flush (NS) 0.9 % injection 10 mL  10 mL Intracatheter PRN Heath Lark, MD         REVIEW OF SYSTEMS: On review of systems, the  patient reports that he is doing well overall. He denies any additional episodes of hematemesis, or melena. He  denies any chest pain, shortness of breath, cough, fevers, chills, night sweats, unintended weight changes. He denies any bladder disturbances, and denies abdominal pain, nausea or vomiting. He denies any new musculoskeletal or joint aches or pains. A complete review of systems is obtained and is otherwise negative.     PHYSICAL EXAM:  Wt Readings from Last 3 Encounters:  03/28/18 171 lb 15.3 oz (78 kg)  03/17/18 171 lb (77.6 kg)  02/16/18 175 lb 9.6 oz (79.7 kg)   Temp Readings from Last 3 Encounters:  04/15/18 98.7 F (37.1 C) (Oral)  04/14/18 98.1 F (36.7 C) (Oral)  04/07/18 99 F (37.2 C) (Oral)   BP Readings from Last 3 Encounters:  04/15/18 (!) 126/56  04/14/18 (!) 155/53  04/07/18 (!) 129/51   Pulse Readings from Last 3 Encounters:  04/15/18 66  04/14/18 91  04/07/18 76   Pain Assessment Pain Score: 0-No pain/10  In general this is a well appearing African American male in no acute distress. He is alert and oriented x4 and appropriate throughout the examination. HEENT reveals that the patient is normocephalic, atraumatic. EOMs are intact. Skin is intact without any evidence of gross lesions. Cardiopulmonary assessment is negative for acute distress and he exhibits normal effort. He has a left AKA.     ECOG = 1  0 - Asymptomatic (Fully active, able  to carry on all predisease activities without restriction)  1 - Symptomatic but completely ambulatory (Restricted in physically strenuous activity but ambulatory and able to carry out work of a light or sedentary nature. For example, light housework, office work)  2 - Symptomatic, <50% in bed during the day (Ambulatory and capable of all self care but unable to carry out any work activities. Up and about more than 50% of waking hours)  3 - Symptomatic, >50% in bed, but not bedbound (Capable of only limited  self-care, confined to bed or chair 50% or more of waking hours)  4 - Bedbound (Completely disabled. Cannot carry on any self-care. Totally confined to bed or chair)  5 - Death   Eustace Pen MM, Creech RH, Tormey DC, et al. [phone]). "Toxicity and response criteria of the [facility] Group". [place] Oncol. 5 (6): 649-55    LABORATORY DATA:  Lab Results  Component Value Date   WBC 6.2 04/14/2018   HGB 7.2 (L) 04/14/2018   HCT 23.5 (L) 04/14/2018   MCV 91.8 04/14/2018   PLT 194 04/14/2018   Lab Results  Component Value Date   NA 141 04/02/2018   K 4.2 04/02/2018   CL 115 (H) 04/02/2018   CO2 18 (L) 04/02/2018   Lab Results  Component Value Date   ALT 12 03/29/2018   AST 12 (L) 03/29/2018   ALKPHOS 74 03/29/2018   BILITOT 0.8 03/29/2018      RADIOGRAPHY: Ct Abdomen Pelvis Wo Contrast  Result Date: 03/28/2018 CLINICAL DATA:  Nausea, vomiting EXAM: CT ABDOMEN AND PELVIS WITHOUT CONTRAST TECHNIQUE: Multidetector CT imaging of the abdomen and pelvis was performed following the standard protocol without IV contrast. COMPARISON:  None. FINDINGS: Lower chest: No confluent opacities or effusions. Hepatobiliary: No focal hepatic abnormality. Gallbladder unremarkable. Pancreas: No focal abnormality or ductal dilatation. Spleen: Calcifications in the spleen.  Normal size. Adrenals/Urinary Tract: Renovascular calcifications. No urinary tract stones or hydronephrosis. Exophytic low-density lesion off the lower pole of the left kidney likely reflects a cyst although this cannot be fully characterized on this unenhanced study. This measures 2 cm. Adrenal glands and urinary bladder unremarkable. Stomach/Bowel: Large stool burden throughout the colon. Normal appendix. Stomach and small bowel grossly unremarkable unenhanced appearance. Vascular/Lymphatic: 3.3 cm infrarenal abdominal aortic aneurysm extending into both common iliac arteries which measure 2.4 cm on the right and 2.2 cm on  the left. No adenopathy. Reproductive: No visible focal abnormality. Other: No free fluid or free air. Musculoskeletal: No acute bony abnormality. IMPRESSION: Infrarenal abdominal aortic aneurysm, 3.3 cm maximally. Recommend followup by ultrasound in 3 years. This recommendation follows ACR consensus guidelines: White Paper of the ACR Incidental Findings Committee II on Vascular Findings. J Am Coll Radiol 2013; 10:789-794. Aortic aneurysm NOS (ICD10-I71.9) Large stool burden throughout the colon. No acute findings in the abdomen or pelvis. Electronically Signed   By: Rolm Baptise M.D.   On: 03/28/2018 21:47   Dg Chest 2 View  Result Date: 03/28/2018 CLINICAL DATA:  Shortness of breath EXAM: CHEST - 2 VIEW COMPARISON:  09/23/2017 FINDINGS: Cardiomegaly. Prior CABG. No confluent airspace opacities, effusions or edema. No acute bony abnormality. IMPRESSION: Cardiomegaly.  No active disease. Electronically Signed   By: Rolm Baptise M.D.   On: 03/28/2018 20:07   Ct Chest Wo Contrast  Result Date: 03/31/2018 CLINICAL DATA:  Gastric cancer. Staging. Asymptomatic. COPD. Ex-smoker, quitting 5 years ago. EXAM: CT CHEST WITHOUT CONTRAST TECHNIQUE: Multidetector CT imaging of the chest was  performed following the standard protocol without IV contrast. COMPARISON:  Plain film chest 03/28/2018. Most recent chest CT 08/26/2017. FINDINGS: Cardiovascular: Advanced aortic and branch vessel atherosclerosis. Mild cardiomegaly, without pericardial effusion. Native coronary artery calcification, status post median sternotomy for CABG. Pulmonary artery enlargement, outflow tract 3.2 cm. Mediastinum/Nodes: No mediastinal or definite hilar adenopathy, given limitations of unenhanced CT. Lungs/Pleura: Resolved pleural fluid since the prior CT. Minimal motion degradation inferiorly. Left upper lobe 6 mm pulmonary nodule on image 43/4. The most recent exam was motion degraded. On a mildly motion degraded exam of 07/11/2017, this felt  to be similar (image 32 of that exam). Upper Abdomen: Normal imaged portions of the liver, spleen, stomach, pancreas, gallbladder, adrenal glands, right kidney. Musculoskeletal: Marked bilateral gynecomastia. A right chest wall skin lesion of 9 mm on image 11/3 is similar on 08/26/17. No acute osseous abnormality. IMPRESSION: 1. A posterior left upper lobe 6 mm nodule is indeterminate, but felt to be similar on 07/11/2017 (both prior chest CTs are motion degraded). This could be re-evaluated at follow-up. 2. Otherwise, no evidence of metastatic disease in the chest. 3.  Aortic Atherosclerosis (ICD10-I70.0). 4. Bilateral gynecomastia. 5. Resolved bilateral pleural fluid and airspace disease since 08/26/2017. 6. Pulmonary artery enlargement suggests pulmonary arterial hypertension. 7. Right lateral chest wall lesion is similar to on the prior. Consider physical exam correlation. Electronically Signed   By: Abigail Miyamoto M.D.   On: 03/31/2018 14:42   Dg Abd 2 Views  Result Date: 03/29/2018 CLINICAL DATA:  Acute left upper quadrant abdominal pain. EXAM: ABDOMEN - 2 VIEW COMPARISON:  CT scan of March 28, 2018. Radiograph of July 10, 2017. FINDINGS: The bowel gas pattern is normal. There is no evidence of free air. No radio-opaque calculi or other significant radiographic abnormality is seen. IMPRESSION: No evidence of bowel obstruction or ileus. Electronically Signed   By: Marijo Conception, M.D.   On: 03/29/2018 16:38       IMPRESSION/PLAN: 1. Adenocarcinoma of the gastric antrum. Dr. Lisbeth Renshaw discusses the pathology findings and reviews the nature of gastric cancer and reviews the rationale to complete his staging work up with PET imaging. We will follow up with this when available. He is not a candidate for chemotherapy at this time and we discussed the options of surgery if he has limited disease versus radiotherapy either in the definitive treatment setting or palliative setting. We discussed the risks,  benefits, short, and long term effects of radiotherapy, and the patient is interested in proceeding if surgery is not indicated. Dr. Lisbeth Renshaw discusses the delivery and logistics of radiotherapy and anticipates a course of 5-6 weeks of radiotherapy. We will see him back for further discussion and simulation pending PET results and evaluation with [name].  In a visit lasting 60 minutes, greater than 50% of the time was spent face to face discussing his case, and coordinating the patient's care.   The above documentation reflects my direct findings during this shared patient visit. Please see the separate note by Dr. Lisbeth Renshaw on this date for the remainder of the patient's plan of care.    Carola Rhine, PAC

## 2018-04-15 NOTE — Telephone Encounter (Signed)
CALLED PATIENT'S NIECE - ROSALYN Benegas TO INFORM OF APPT. BEING MOVED UP TO 04-30-18 - ARRIVAL TIME - 11:15 AM @ DR. BYERLY'S OFFICE, SPOKE WITH PATIENT'S NIECE AND SHE IS AWARE OF THIS APPT.

## 2018-04-16 ENCOUNTER — Other Ambulatory Visit: Payer: Self-pay

## 2018-04-16 ENCOUNTER — Inpatient Hospital Stay: Payer: No Typology Code available for payment source

## 2018-04-16 DIAGNOSIS — I129 Hypertensive chronic kidney disease with stage 1 through stage 4 chronic kidney disease, or unspecified chronic kidney disease: Secondary | ICD-10-CM | POA: Diagnosis not present

## 2018-04-16 DIAGNOSIS — D509 Iron deficiency anemia, unspecified: Secondary | ICD-10-CM | POA: Diagnosis not present

## 2018-04-16 DIAGNOSIS — D631 Anemia in chronic kidney disease: Secondary | ICD-10-CM

## 2018-04-16 DIAGNOSIS — K922 Gastrointestinal hemorrhage, unspecified: Secondary | ICD-10-CM

## 2018-04-16 DIAGNOSIS — C163 Malignant neoplasm of pyloric antrum: Secondary | ICD-10-CM | POA: Diagnosis not present

## 2018-04-16 DIAGNOSIS — N184 Chronic kidney disease, stage 4 (severe): Secondary | ICD-10-CM | POA: Diagnosis not present

## 2018-04-16 DIAGNOSIS — N189 Chronic kidney disease, unspecified: Secondary | ICD-10-CM

## 2018-04-16 DIAGNOSIS — E1122 Type 2 diabetes mellitus with diabetic chronic kidney disease: Secondary | ICD-10-CM | POA: Diagnosis not present

## 2018-04-16 LAB — PREPARE RBC (CROSSMATCH)

## 2018-04-16 MED ORDER — SODIUM CHLORIDE 0.9% FLUSH
3.0000 mL | INTRAVENOUS | Status: DC | PRN
  Filled 2018-04-16: qty 10

## 2018-04-16 MED ORDER — SODIUM CHLORIDE 0.9% IV SOLUTION
250.0000 mL | Freq: Once | INTRAVENOUS | Status: AC
  Administered 2018-04-16: 250 mL via INTRAVENOUS
  Filled 2018-04-16: qty 250

## 2018-04-16 MED ORDER — DIPHENHYDRAMINE HCL 25 MG PO CAPS
ORAL_CAPSULE | ORAL | Status: AC
  Filled 2018-04-16: qty 2

## 2018-04-16 MED ORDER — ACETAMINOPHEN 325 MG PO TABS
650.0000 mg | ORAL_TABLET | Freq: Once | ORAL | Status: AC
  Administered 2018-04-16: 650 mg via ORAL

## 2018-04-16 MED ORDER — HEPARIN SOD (PORK) LOCK FLUSH 100 UNIT/ML IV SOLN
500.0000 [IU] | Freq: Every day | INTRAVENOUS | Status: DC | PRN
  Filled 2018-04-16: qty 5

## 2018-04-16 MED ORDER — SODIUM CHLORIDE 0.9% FLUSH
10.0000 mL | INTRAVENOUS | Status: DC | PRN
  Filled 2018-04-16: qty 10

## 2018-04-16 MED ORDER — DIPHENHYDRAMINE HCL 25 MG PO CAPS
25.0000 mg | ORAL_CAPSULE | Freq: Once | ORAL | Status: AC
  Administered 2018-04-16: 25 mg via ORAL

## 2018-04-16 MED ORDER — ACETAMINOPHEN 325 MG PO TABS
ORAL_TABLET | ORAL | Status: AC
  Filled 2018-04-16: qty 2

## 2018-04-16 MED ORDER — HEPARIN SOD (PORK) LOCK FLUSH 100 UNIT/ML IV SOLN
250.0000 [IU] | INTRAVENOUS | Status: DC | PRN
  Filled 2018-04-16: qty 5

## 2018-04-16 NOTE — Progress Notes (Signed)
Per [name] ok to run blood transfusion at 300cc.hr until completion after first 15 minutes.

## 2018-04-16 NOTE — Patient Instructions (Signed)

## 2018-04-19 LAB — BPAM RBC
Blood Product Expiration Date: 202003242359
Blood Product Expiration Date: 202004152359
ISSUE DATE / TIME: 202003131445
ISSUE DATE / TIME: 202003131445
Unit Type and Rh: 1700
Unit Type and Rh: 1700

## 2018-04-19 LAB — TYPE AND SCREEN
ABO/RH(D): B NEG
Antibody Screen: NEGATIVE
Unit division: 0
Unit division: 0

## 2018-04-21 ENCOUNTER — Encounter (HOSPITAL_COMMUNITY)
Admission: RE | Admit: 2018-04-21 | Discharge: 2018-04-21 | Disposition: A | Discharge status: Home / Self Care | Payer: No Typology Code available for payment source | Source: Ambulatory Visit | Attending: Radiation Oncology | Admitting: Radiation Oncology

## 2018-04-21 ENCOUNTER — Inpatient Hospital Stay (HOSPITAL_COMMUNITY): Admission: RE | Admit: 2018-04-21 | Payer: Medicare Other | Source: Ambulatory Visit

## 2018-04-21 ENCOUNTER — Telehealth: Payer: Self-pay | Admitting: Radiation Oncology

## 2018-04-21 ENCOUNTER — Other Ambulatory Visit: Payer: Self-pay

## 2018-04-21 DIAGNOSIS — C163 Malignant neoplasm of pyloric antrum: Secondary | ICD-10-CM | POA: Diagnosis not present

## 2018-04-21 DIAGNOSIS — I2510 Atherosclerotic heart disease of native coronary artery without angina pectoris: Secondary | ICD-10-CM | POA: Insufficient documentation

## 2018-04-21 DIAGNOSIS — C169 Malignant neoplasm of stomach, unspecified: Secondary | ICD-10-CM | POA: Diagnosis not present

## 2018-04-21 DIAGNOSIS — I700 Atherosclerosis of aorta: Secondary | ICD-10-CM | POA: Diagnosis not present

## 2018-04-21 LAB — GLUCOSE, CAPILLARY: Glucose-Capillary: 162 mg/dL — ABNORMAL HIGH (ref 70–99)

## 2018-04-21 MED ORDER — FLUDEOXYGLUCOSE F - 18 (FDG) INJECTION
8.4900 | Freq: Once | INTRAVENOUS | Status: AC | PRN
  Administered 2018-04-21: 8.49 via INTRAVENOUS

## 2018-04-21 NOTE — Telephone Encounter (Addendum)
I spoke with the patient's niece and also left a message with his sister. We reviewed PET imaging and need Dr. Marlowe Aschoff input regarding surgery, and if not then we would proceed with radiotherapy as an alternative.

## 2018-04-23 ENCOUNTER — Other Ambulatory Visit (HOSPITAL_COMMUNITY): Payer: Self-pay | Admitting: *Deleted

## 2018-04-26 ENCOUNTER — Encounter (HOSPITAL_COMMUNITY)
Admission: RE | Admit: 2018-04-26 | Discharge: 2018-04-26 | Disposition: A | Discharge status: Home / Self Care | Payer: Medicare Other | Source: Ambulatory Visit | Attending: Nephrology | Admitting: Nephrology

## 2018-04-26 ENCOUNTER — Other Ambulatory Visit: Payer: Self-pay

## 2018-04-26 ENCOUNTER — Encounter (HOSPITAL_COMMUNITY): Payer: Medicare Other

## 2018-04-26 VITALS — BP 139/58 | HR 60 | Temp 98.6°F | Resp 20 | Ht 68.0 in | Wt 171.0 lb

## 2018-04-26 DIAGNOSIS — E118 Type 2 diabetes mellitus with unspecified complications: Secondary | ICD-10-CM | POA: Diagnosis not present

## 2018-04-26 DIAGNOSIS — N184 Chronic kidney disease, stage 4 (severe): Secondary | ICD-10-CM | POA: Diagnosis not present

## 2018-04-26 LAB — POCT HEMOGLOBIN-HEMACUE: HEMOGLOBIN: 9.1 g/dL — AB (ref 13.0–17.0)

## 2018-04-26 MED ORDER — DARBEPOETIN ALFA 60 MCG/0.3ML IJ SOSY
60.0000 ug | PREFILLED_SYRINGE | INTRAMUSCULAR | Status: DC
  Administered 2018-04-26: 60 ug via SUBCUTANEOUS

## 2018-04-26 MED ORDER — SODIUM CHLORIDE 0.9 % IV SOLN
510.0000 mg | INTRAVENOUS | Status: DC
  Administered 2018-04-26: 510 mg via INTRAVENOUS
  Filled 2018-04-26: qty 510

## 2018-04-26 MED ORDER — DARBEPOETIN ALFA 60 MCG/0.3ML IJ SOSY
PREFILLED_SYRINGE | INTRAMUSCULAR | Status: AC
  Administered 2018-04-26: 60 ug via SUBCUTANEOUS
  Filled 2018-04-26: qty 0.3

## 2018-04-30 ENCOUNTER — Other Ambulatory Visit: Payer: Self-pay

## 2018-04-30 DIAGNOSIS — C163 Malignant neoplasm of pyloric antrum: Secondary | ICD-10-CM | POA: Diagnosis not present

## 2018-05-03 ENCOUNTER — Encounter (HOSPITAL_COMMUNITY)
Admission: RE | Admit: 2018-05-03 | Discharge: 2018-05-03 | Disposition: A | Discharge status: Home / Self Care | Payer: Medicare Other | Source: Ambulatory Visit | Attending: Nephrology | Admitting: Nephrology

## 2018-05-03 ENCOUNTER — Telehealth: Payer: Self-pay

## 2018-05-03 ENCOUNTER — Other Ambulatory Visit: Payer: Self-pay

## 2018-05-03 DIAGNOSIS — N184 Chronic kidney disease, stage 4 (severe): Secondary | ICD-10-CM | POA: Diagnosis not present

## 2018-05-03 DIAGNOSIS — E118 Type 2 diabetes mellitus with unspecified complications: Secondary | ICD-10-CM | POA: Diagnosis not present

## 2018-05-03 MED ORDER — SODIUM CHLORIDE 0.9 % IV SOLN
510.0000 mg | INTRAVENOUS | Status: DC
  Administered 2018-05-03: 510 mg via INTRAVENOUS
  Filled 2018-05-03 (×2): qty 17

## 2018-05-03 NOTE — Telephone Encounter (Signed)
Called and given below message. Sister verbalized understanding.

## 2018-05-03 NOTE — Telephone Encounter (Signed)
-----   Message from Heath Lark, MD sent at 05/03/2018  9:59 AM EDT ----- Regarding: call sister Can you verify he has appointment to see [name]? The nephrologist is managing his anemia Since I am not prescribing chemo, I suggest we cancel his appointment on Wednesday and he just follow-up with his primary doctor

## 2018-05-03 NOTE — Telephone Encounter (Signed)
I am not radiation oncologist Give her the number to call radiation oncology to schedule follow-up. I think he saw Dr. Lisbeth Renshaw

## 2018-05-03 NOTE — Telephone Encounter (Signed)
Called and given below message to sister. She verbalized understanding. Appts canceled for 4/1, sister will contact nursing home. They saw [name] on 3/27, she has not agreed to do the surgery. She wants him to see Cardiology and GI for further evaluation and is ordering more testing. Sister would like Leonard [name] to see [name] in the future to follow up, if he is unable to do surgery then they would possibly want radiation.

## 2018-05-04 ENCOUNTER — Telehealth: Payer: Self-pay | Admitting: Radiation Oncology

## 2018-05-04 DIAGNOSIS — D500 Iron deficiency anemia secondary to blood loss (chronic): Secondary | ICD-10-CM | POA: Diagnosis not present

## 2018-05-04 DIAGNOSIS — I5032 Chronic diastolic (congestive) heart failure: Secondary | ICD-10-CM | POA: Diagnosis not present

## 2018-05-04 DIAGNOSIS — N184 Chronic kidney disease, stage 4 (severe): Secondary | ICD-10-CM | POA: Diagnosis not present

## 2018-05-04 DIAGNOSIS — C169 Malignant neoplasm of stomach, unspecified: Secondary | ICD-10-CM | POA: Diagnosis not present

## 2018-05-04 NOTE — Telephone Encounter (Signed)
I called the niece and we discussed that [name] is asking Leonard [name] to have cardiac clearance and to meet with GI prior to scheduling any surgery. If he is not given clearance, we would offer XRT. I will check back to see how he proceeds. His niece was transferred to med onc for questions about his aranesp and fereheme injections.

## 2018-05-05 ENCOUNTER — Inpatient Hospital Stay: Payer: No Typology Code available for payment source

## 2018-05-05 ENCOUNTER — Encounter (HOSPITAL_COMMUNITY): Payer: Medicare Other

## 2018-05-05 ENCOUNTER — Inpatient Hospital Stay: Payer: No Typology Code available for payment source | Admitting: Hematology and Oncology

## 2018-05-06 DIAGNOSIS — E119 Type 2 diabetes mellitus without complications: Secondary | ICD-10-CM | POA: Diagnosis not present

## 2018-05-06 DIAGNOSIS — E039 Hypothyroidism, unspecified: Secondary | ICD-10-CM | POA: Diagnosis not present

## 2018-05-06 DIAGNOSIS — E559 Vitamin D deficiency, unspecified: Secondary | ICD-10-CM | POA: Diagnosis not present

## 2018-05-06 DIAGNOSIS — C801 Malignant (primary) neoplasm, unspecified: Secondary | ICD-10-CM | POA: Diagnosis not present

## 2018-05-06 DIAGNOSIS — D649 Anemia, unspecified: Secondary | ICD-10-CM | POA: Diagnosis not present

## 2018-05-10 ENCOUNTER — Encounter (HOSPITAL_COMMUNITY): Payer: Medicare Other

## 2018-05-10 ENCOUNTER — Other Ambulatory Visit: Payer: Self-pay

## 2018-05-10 ENCOUNTER — Encounter (HOSPITAL_COMMUNITY)
Admission: RE | Admit: 2018-05-10 | Discharge: 2018-05-10 | Disposition: A | Discharge status: Home / Self Care | Payer: Medicare Other | Source: Ambulatory Visit | Attending: Nephrology | Admitting: Nephrology

## 2018-05-10 VITALS — BP 162/62 | HR 73 | Temp 97.5°F

## 2018-05-10 DIAGNOSIS — E118 Type 2 diabetes mellitus with unspecified complications: Secondary | ICD-10-CM | POA: Insufficient documentation

## 2018-05-10 DIAGNOSIS — N184 Chronic kidney disease, stage 4 (severe): Secondary | ICD-10-CM | POA: Insufficient documentation

## 2018-05-10 DIAGNOSIS — R41841 Cognitive communication deficit: Secondary | ICD-10-CM | POA: Diagnosis not present

## 2018-05-10 DIAGNOSIS — D649 Anemia, unspecified: Secondary | ICD-10-CM | POA: Diagnosis not present

## 2018-05-10 DIAGNOSIS — D002 Carcinoma in situ of stomach: Secondary | ICD-10-CM | POA: Diagnosis not present

## 2018-05-10 DIAGNOSIS — C801 Malignant (primary) neoplasm, unspecified: Secondary | ICD-10-CM | POA: Diagnosis not present

## 2018-05-10 LAB — RENAL FUNCTION PANEL
Albumin: 3.3 g/dL — ABNORMAL LOW (ref 3.5–5.0)
Anion gap: 8 (ref 5–15)
BUN: 40 mg/dL — ABNORMAL HIGH (ref 8–23)
CO2: 19 mmol/L — ABNORMAL LOW (ref 22–32)
Calcium: 8.6 mg/dL — ABNORMAL LOW (ref 8.9–10.3)
Chloride: 111 mmol/L (ref 98–111)
Creatinine, Ser: 3.1 mg/dL — ABNORMAL HIGH (ref 0.61–1.24)
GFR calc Af Amer: 23 mL/min — ABNORMAL LOW (ref >60)
GFR calc non Af Amer: 20 mL/min — ABNORMAL LOW (ref >60)
Glucose, Bld: 221 mg/dL — ABNORMAL HIGH (ref 70–99)
Phosphorus: 3.4 mg/dL (ref 2.5–4.6)
Potassium: 5 mmol/L (ref 3.5–5.1)
Sodium: 138 mmol/L (ref 135–145)

## 2018-05-10 LAB — CBC WITH DIFFERENTIAL/PLATELET
Abs Immature Granulocytes: 0.03 10*3/uL (ref 0.00–0.07)
Basophils Absolute: 0.1 10*3/uL (ref 0.0–0.1)
Basophils Relative: 1 %
Eosinophils Absolute: 0.9 10*3/uL — ABNORMAL HIGH (ref 0.0–0.5)
Eosinophils Relative: 14 %
HCT: 30.6 % — ABNORMAL LOW (ref 39.0–52.0)
Hemoglobin: 9.4 g/dL — ABNORMAL LOW (ref 13.0–17.0)
Immature Granulocytes: 1 %
Lymphocytes Relative: 17 %
Lymphs Abs: 1.1 10*3/uL (ref 0.7–4.0)
MCH: 27.8 pg (ref 26.0–34.0)
MCHC: 30.7 g/dL (ref 30.0–36.0)
MCV: 90.5 fL (ref 80.0–100.0)
Monocytes Absolute: 0.6 10*3/uL (ref 0.1–1.0)
Monocytes Relative: 9 %
Neutro Abs: 3.7 10*3/uL (ref 1.7–7.7)
Neutrophils Relative %: 58 %
Platelets: 136 10*3/uL — ABNORMAL LOW (ref 150–400)
RBC: 3.38 MIL/uL — ABNORMAL LOW (ref 4.22–5.81)
RDW: 15.8 % — ABNORMAL HIGH (ref 11.5–15.5)
WBC: 6.4 10*3/uL (ref 4.0–10.5)
nRBC: 0 % (ref 0.0–0.2)

## 2018-05-10 LAB — POCT HEMOGLOBIN-HEMACUE: Hemoglobin: 9.5 g/dL — ABNORMAL LOW (ref 13.0–17.0)

## 2018-05-10 MED ORDER — DARBEPOETIN ALFA 60 MCG/0.3ML IJ SOSY
PREFILLED_SYRINGE | INTRAMUSCULAR | Status: AC
  Administered 2018-05-10: 60 ug via SUBCUTANEOUS
  Filled 2018-05-10: qty 0.3

## 2018-05-10 MED ORDER — DARBEPOETIN ALFA 60 MCG/0.3ML IJ SOSY
60.0000 ug | PREFILLED_SYRINGE | INTRAMUSCULAR | Status: DC
  Administered 2018-05-10: 11:00:00 60 ug via SUBCUTANEOUS

## 2018-05-11 ENCOUNTER — Telehealth: Payer: Self-pay | Admitting: Radiation Oncology

## 2018-05-11 LAB — PTH, INTACT AND CALCIUM
Calcium, Total (PTH): 8.4 mg/dL — ABNORMAL LOW (ref 8.6–10.2)
PTH: 54 pg/mL (ref 15–65)

## 2018-05-11 NOTE — Telephone Encounter (Addendum)
I spoke with Karie Kirks, the patient's niece and they have not heard from cardiology or GI for clearance and EUS. I gave her the number to Dr. Marlowe Aschoff office to check with the referral coordinator. I will follow up again next week to see what's taken place.

## 2018-05-17 ENCOUNTER — Telehealth: Payer: Self-pay | Admitting: Interventional Cardiology

## 2018-05-17 NOTE — Telephone Encounter (Signed)
Joelene Millin from [facility] called. She wanted to know if the pt had been seen for his clearance yet, and I said no. The pt has an urgent referral in the system for surgical clearance. Surgery has not been scheduled yet.

## 2018-05-17 NOTE — Telephone Encounter (Signed)
I spoke with Leonard [name] at [facility], [phone]. Kim did not know name of cardiologist patient had seen in the past, would have to ask patient's sister. I advised Leonard [name] that I do not find record that  pt has seen provider at [facility], that he has been seen by Dr Aundra Dubin in the [facility]. Leonard [name] has been given the phone number for [facility], advised to contact them for surgical clearance. Leonard [name] knows to call our office back if she finds patient has seen provider at [facility]

## 2018-05-24 ENCOUNTER — Inpatient Hospital Stay (HOSPITAL_COMMUNITY): Admission: RE | Admit: 2018-05-24 | Payer: Medicare Other | Source: Ambulatory Visit

## 2018-05-24 DIAGNOSIS — D649 Anemia, unspecified: Secondary | ICD-10-CM | POA: Diagnosis not present

## 2018-05-24 DIAGNOSIS — C801 Malignant (primary) neoplasm, unspecified: Secondary | ICD-10-CM | POA: Diagnosis not present

## 2018-05-24 DIAGNOSIS — R41841 Cognitive communication deficit: Secondary | ICD-10-CM | POA: Diagnosis not present

## 2018-05-24 DIAGNOSIS — D002 Carcinoma in situ of stomach: Secondary | ICD-10-CM | POA: Diagnosis not present

## 2018-05-27 ENCOUNTER — Other Ambulatory Visit: Payer: Self-pay

## 2018-05-27 ENCOUNTER — Telehealth: Payer: Self-pay

## 2018-05-27 DIAGNOSIS — C169 Malignant neoplasm of stomach, unspecified: Secondary | ICD-10-CM

## 2018-05-27 NOTE — Telephone Encounter (Signed)
I spoke with the patient's POA.  She is notified of EUS [place] on 05/31/18 8:30 arrival and she is asked that he be NPO after midnight.  All questions answered.

## 2018-05-27 NOTE — Telephone Encounter (Signed)
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 Thanks. GM ----- Message ----- From: Marlon Pel, RN Sent: 05/27/2018   1:35 PM EDT To: Irving Copas., MD Subject: RE: Urgent EUS                                 Please advise type of EUS and diagnosis ----- Message ----- From: Irving Copas., MD Sent: 05/27/2018  12:56 PM EDT To: Stark Klein, MD, Timothy Lasso, RN Subject: Urgent EUS                                     Dear Chong Sicilian or covering RN,This patient needs to have an urgent EUS within the next 2 weeks. If he can be fit in next Monday morning after my other procedures vs next Friday morning first case EUS that would be ideal.If neither of these dates work for patient then he needs to be done on May 6. Once we have confirmed a date please reply all so that [name] and I know the patient has confirmed. Thank you.GM

## 2018-05-28 DIAGNOSIS — N184 Chronic kidney disease, stage 4 (severe): Secondary | ICD-10-CM | POA: Diagnosis not present

## 2018-05-28 DIAGNOSIS — I5032 Chronic diastolic (congestive) heart failure: Secondary | ICD-10-CM | POA: Diagnosis not present

## 2018-05-28 DIAGNOSIS — I739 Peripheral vascular disease, unspecified: Secondary | ICD-10-CM | POA: Diagnosis not present

## 2018-05-28 DIAGNOSIS — I2510 Atherosclerotic heart disease of native coronary artery without angina pectoris: Secondary | ICD-10-CM | POA: Diagnosis not present

## 2018-05-28 NOTE — Pre-Procedure Instructions (Signed)
   Leonard [name]  05/28/2018    Mr. Monnig's  procedure is scheduled on Monday, 05/31/2018 at 10:00 AM.   Report to [facility] Entrance "A" Admitting Office at 8:30 AM.   Call this number if you have problems the morning of surgery: [phone]   Remember:  Patient is not to eat or drink after midnight Sunday,  05/30/2018  Take these medicines the morning of surgery with A SIP OF WATER: Amlodipine (Norvasc), Carvedilol (Coreg), Isosorbide Mononitrate (Imdur), Levothyroxine (Synthroid), Pantoprazole (Protonix), Lanhala inhaler, Duo-neb - if needed.   Please check patient's blood sugar when he gets up Monday AM and every 2 hours until he leaves for the hospital. If blood sugar is >220 take 1/2 of usual correction dose of Novolog insulin. If blood sugar is 70 or below, treat with 1/2 cup of clear juice (apple) and recheck blood sugar 15 minutes after drinking juice. If blood sugar continues to be 70 or below, call the Endoscopy Department and ask to speak to a nurse.  Per Instructions Dr. Belinda Block, Anesthesiologist/Tatumn Corbridge Stan Head, RN    Do not wear jewelry.  Do not wear lotions, powders, cologne or deodorant.  Men may shave face and neck.  Do not bring valuables to the hospital.  [facility] is not responsible for any belongings or valuables.  Contacts, dentures or bridgework may not be worn into surgery.  Any questions today, please call Lilia Pro, RN at [phone]

## 2018-05-28 NOTE — Progress Notes (Signed)
Received A1C fax from nursing facility. A1C was 6.4 on 05/06/18.  EKG - 03/28/18 - in Epic Echo - 09/24/17 - in [place] - 10/26/17 - in [place]

## 2018-05-28 NOTE — Progress Notes (Signed)
Pt is a resident at [facility]. Spoke with Verdene Lennert, RN, DON for pre-op call. Pt is alert, oriented and able to speak for himself per [place]. Pt does have a cardiac history with CAD and CHF. Pt's cardiologist is Dr. Aundra Dubin. Pt is a type 2 diabetic. Pt's fasting blood sugar is usually between 144-150 per [place]. She will fax me the [facility] and pt's most recent A1C.  I will fax her pre--procedure instructions to [phone].     Coronavirus Screening  Have you experienced the following symptoms:  Cough no Fever (>100.34F)  No  (They are checking every shift) Runny nose No Sore throat No Difficulty breathing/shortness of breath  No  Have you or a family member traveled in the last 14 days and where? No

## 2018-05-31 ENCOUNTER — Ambulatory Visit (HOSPITAL_COMMUNITY)
Admission: RE | Admit: 2018-05-31 | Discharge: 2018-05-31 | Disposition: A | Discharge status: Skilled Nursing Facility | Payer: Medicare Other | Attending: Gastroenterology | Admitting: Gastroenterology

## 2018-05-31 ENCOUNTER — Encounter (HOSPITAL_COMMUNITY)
Admission: RE | Disposition: A | Discharge status: Skilled Nursing Facility | Payer: Self-pay | Source: Home / Self Care | Attending: Gastroenterology

## 2018-05-31 ENCOUNTER — Ambulatory Visit (HOSPITAL_COMMUNITY): Payer: Medicare Other | Admitting: Anesthesiology

## 2018-05-31 ENCOUNTER — Other Ambulatory Visit: Payer: Self-pay

## 2018-05-31 ENCOUNTER — Encounter (HOSPITAL_COMMUNITY): Payer: Self-pay

## 2018-05-31 DIAGNOSIS — C169 Malignant neoplasm of stomach, unspecified: Secondary | ICD-10-CM

## 2018-05-31 DIAGNOSIS — K254 Chronic or unspecified gastric ulcer with hemorrhage: Secondary | ICD-10-CM | POA: Diagnosis not present

## 2018-05-31 DIAGNOSIS — I2721 Secondary pulmonary arterial hypertension: Secondary | ICD-10-CM | POA: Insufficient documentation

## 2018-05-31 DIAGNOSIS — Z823 Family history of stroke: Secondary | ICD-10-CM | POA: Insufficient documentation

## 2018-05-31 DIAGNOSIS — C164 Malignant neoplasm of pylorus: Secondary | ICD-10-CM | POA: Insufficient documentation

## 2018-05-31 DIAGNOSIS — E1122 Type 2 diabetes mellitus with diabetic chronic kidney disease: Secondary | ICD-10-CM | POA: Diagnosis not present

## 2018-05-31 DIAGNOSIS — I130 Hypertensive heart and chronic kidney disease with heart failure and stage 1 through stage 4 chronic kidney disease, or unspecified chronic kidney disease: Secondary | ICD-10-CM | POA: Insufficient documentation

## 2018-05-31 DIAGNOSIS — Z6826 Body mass index (BMI) 26.0-26.9, adult: Secondary | ICD-10-CM | POA: Diagnosis not present

## 2018-05-31 DIAGNOSIS — Z7901 Long term (current) use of anticoagulants: Secondary | ICD-10-CM | POA: Diagnosis not present

## 2018-05-31 DIAGNOSIS — Z89612 Acquired absence of left leg above knee: Secondary | ICD-10-CM | POA: Insufficient documentation

## 2018-05-31 DIAGNOSIS — K228 Other specified diseases of esophagus: Secondary | ICD-10-CM

## 2018-05-31 DIAGNOSIS — K2970 Gastritis, unspecified, without bleeding: Secondary | ICD-10-CM | POA: Diagnosis not present

## 2018-05-31 DIAGNOSIS — Z85828 Personal history of other malignant neoplasm of skin: Secondary | ICD-10-CM | POA: Diagnosis not present

## 2018-05-31 DIAGNOSIS — I2510 Atherosclerotic heart disease of native coronary artery without angina pectoris: Secondary | ICD-10-CM | POA: Diagnosis not present

## 2018-05-31 DIAGNOSIS — C170 Malignant neoplasm of duodenum: Secondary | ICD-10-CM | POA: Diagnosis not present

## 2018-05-31 DIAGNOSIS — E11319 Type 2 diabetes mellitus with unspecified diabetic retinopathy without macular edema: Secondary | ICD-10-CM | POA: Diagnosis not present

## 2018-05-31 DIAGNOSIS — K449 Diaphragmatic hernia without obstruction or gangrene: Secondary | ICD-10-CM | POA: Insufficient documentation

## 2018-05-31 DIAGNOSIS — Z8049 Family history of malignant neoplasm of other genital organs: Secondary | ICD-10-CM | POA: Insufficient documentation

## 2018-05-31 DIAGNOSIS — D631 Anemia in chronic kidney disease: Secondary | ICD-10-CM | POA: Insufficient documentation

## 2018-05-31 DIAGNOSIS — Z801 Family history of malignant neoplasm of trachea, bronchus and lung: Secondary | ICD-10-CM | POA: Insufficient documentation

## 2018-05-31 DIAGNOSIS — K5710 Diverticulosis of small intestine without perforation or abscess without bleeding: Secondary | ICD-10-CM

## 2018-05-31 DIAGNOSIS — E1142 Type 2 diabetes mellitus with diabetic polyneuropathy: Secondary | ICD-10-CM | POA: Insufficient documentation

## 2018-05-31 DIAGNOSIS — Z87891 Personal history of nicotine dependence: Secondary | ICD-10-CM | POA: Insufficient documentation

## 2018-05-31 DIAGNOSIS — I5032 Chronic diastolic (congestive) heart failure: Secondary | ICD-10-CM | POA: Insufficient documentation

## 2018-05-31 DIAGNOSIS — J449 Chronic obstructive pulmonary disease, unspecified: Secondary | ICD-10-CM | POA: Diagnosis not present

## 2018-05-31 DIAGNOSIS — Z86718 Personal history of other venous thrombosis and embolism: Secondary | ICD-10-CM | POA: Diagnosis not present

## 2018-05-31 DIAGNOSIS — Z880 Allergy status to penicillin: Secondary | ICD-10-CM | POA: Insufficient documentation

## 2018-05-31 DIAGNOSIS — N184 Chronic kidney disease, stage 4 (severe): Secondary | ICD-10-CM | POA: Diagnosis not present

## 2018-05-31 DIAGNOSIS — E7800 Pure hypercholesterolemia, unspecified: Secondary | ICD-10-CM | POA: Insufficient documentation

## 2018-05-31 DIAGNOSIS — E1151 Type 2 diabetes mellitus with diabetic peripheral angiopathy without gangrene: Secondary | ICD-10-CM | POA: Insufficient documentation

## 2018-05-31 DIAGNOSIS — Z951 Presence of aortocoronary bypass graft: Secondary | ICD-10-CM | POA: Insufficient documentation

## 2018-05-31 DIAGNOSIS — K219 Gastro-esophageal reflux disease without esophagitis: Secondary | ICD-10-CM | POA: Insufficient documentation

## 2018-05-31 DIAGNOSIS — Z8249 Family history of ischemic heart disease and other diseases of the circulatory system: Secondary | ICD-10-CM | POA: Insufficient documentation

## 2018-05-31 DIAGNOSIS — K2950 Unspecified chronic gastritis without bleeding: Secondary | ICD-10-CM | POA: Insufficient documentation

## 2018-05-31 DIAGNOSIS — M1990 Unspecified osteoarthritis, unspecified site: Secondary | ICD-10-CM | POA: Insufficient documentation

## 2018-05-31 DIAGNOSIS — K3189 Other diseases of stomach and duodenum: Secondary | ICD-10-CM

## 2018-05-31 DIAGNOSIS — Q399 Congenital malformation of esophagus, unspecified: Secondary | ICD-10-CM | POA: Diagnosis not present

## 2018-05-31 DIAGNOSIS — Z89421 Acquired absence of other right toe(s): Secondary | ICD-10-CM | POA: Insufficient documentation

## 2018-05-31 DIAGNOSIS — K222 Esophageal obstruction: Secondary | ICD-10-CM | POA: Diagnosis not present

## 2018-05-31 HISTORY — PX: BIOPSY: SHX5522

## 2018-05-31 HISTORY — PX: EUS: SHX5427

## 2018-05-31 HISTORY — PX: ESOPHAGOGASTRODUODENOSCOPY (EGD) WITH PROPOFOL: SHX5813

## 2018-05-31 LAB — POCT I-STAT 4, (NA,K, GLUC, HGB,HCT)
Glucose, Bld: 192 mg/dL — ABNORMAL HIGH (ref 70–99)
HCT: 31 % — ABNORMAL LOW (ref 39.0–52.0)
Hemoglobin: 10.5 g/dL — ABNORMAL LOW (ref 13.0–17.0)
Potassium: 4.3 mmol/L (ref 3.5–5.1)
Sodium: 143 mmol/L (ref 135–145)

## 2018-05-31 SURGERY — ESOPHAGOGASTRODUODENOSCOPY (EGD) WITH PROPOFOL
Anesthesia: General

## 2018-05-31 MED ORDER — PROPOFOL 10 MG/ML IV BOLUS
INTRAVENOUS | Status: DC | PRN
  Administered 2018-05-31: 100 mg via INTRAVENOUS

## 2018-05-31 MED ORDER — SODIUM CHLORIDE 0.9 % IV SOLN
INTRAVENOUS | Status: DC
  Administered 2018-05-31: 09:00:00 via INTRAVENOUS

## 2018-05-31 MED ORDER — FENTANYL CITRATE (PF) 100 MCG/2ML IJ SOLN
INTRAMUSCULAR | Status: DC | PRN
  Administered 2018-05-31: 50 ug via INTRAVENOUS

## 2018-05-31 MED ORDER — EPHEDRINE SULFATE-NACL 50-0.9 MG/10ML-% IV SOSY
PREFILLED_SYRINGE | INTRAVENOUS | Status: DC | PRN
  Administered 2018-05-31: 10 mg via INTRAVENOUS
  Administered 2018-05-31: 15 mg via INTRAVENOUS
  Administered 2018-05-31: 10 mg via INTRAVENOUS

## 2018-05-31 MED ORDER — LIDOCAINE 2% (20 MG/ML) 5 ML SYRINGE
INTRAMUSCULAR | Status: DC | PRN
  Administered 2018-05-31: 40 mg via INTRAVENOUS

## 2018-05-31 MED ORDER — PHENYLEPHRINE 40 MCG/ML (10ML) SYRINGE FOR IV PUSH (FOR BLOOD PRESSURE SUPPORT)
PREFILLED_SYRINGE | INTRAVENOUS | Status: DC | PRN
  Administered 2018-05-31 (×2): 120 ug via INTRAVENOUS
  Administered 2018-05-31 (×2): 80 ug via INTRAVENOUS

## 2018-05-31 MED ORDER — ONDANSETRON HCL 4 MG/2ML IJ SOLN
INTRAMUSCULAR | Status: DC | PRN
  Administered 2018-05-31: 4 mg via INTRAVENOUS

## 2018-05-31 MED ORDER — SUCCINYLCHOLINE CHLORIDE 200 MG/10ML IV SOSY
PREFILLED_SYRINGE | INTRAVENOUS | Status: DC | PRN
  Administered 2018-05-31: 100 mg via INTRAVENOUS

## 2018-05-31 SURGICAL SUPPLY — 15 items

## 2018-05-31 NOTE — Discharge Instructions (Signed)
YOU HAD AN ENDOSCOPIC PROCEDURE TODAY: Refer to the procedure report and other information in the discharge instructions given to you for any specific questions about what was found during the examination. If this information does not answer your questions, please call  office at [phone] to clarify.   YOU SHOULD EXPECT: Some feelings of bloating in the abdomen. Passage of more gas than usual. Walking can help get rid of the air that was put into your GI tract during the procedure and reduce the bloating. If you had a lower endoscopy (such as a colonoscopy or flexible sigmoidoscopy) you may notice spotting of blood in your stool or on the toilet paper. Some abdominal soreness may be present for a day or two, also.  DIET: Your first meal following the procedure should be a light meal and then it is ok to progress to your normal diet. A half-sandwich or bowl of soup is an example of a good first meal. Heavy or fried foods are harder to digest and may make you feel nauseous or bloated. Drink plenty of fluids but you should avoid alcoholic beverages for 24 hours. If you had a esophageal dilation, please see attached instructions for diet.    ACTIVITY: Your care partner should take you home directly after the procedure. You should plan to take it easy, moving slowly for the rest of the day. You can resume normal activity the day after the procedure however YOU SHOULD NOT DRIVE, use power tools, machinery or perform tasks that involve climbing or major physical exertion for 24 hours (because of the sedation medicines used during the test).   SYMPTOMS TO REPORT IMMEDIATELY: A gastroenterologist can be reached at any hour. Please call [phone]  for any of the following symptoms:   Following upper endoscopy (EGD, EUS, ERCP, esophageal dilation) Vomiting of blood or coffee ground material  New, significant abdominal pain  New, significant chest pain or pain under the shoulder blades  Painful or  persistently difficult swallowing  New shortness of breath  Black, tarry-looking or red, bloody stools  FOLLOW UP:  If any biopsies were taken you will be contacted by phone or by letter within the next 1-3 weeks. Call [phone]  if you have not heard about the biopsies in 3 weeks.  Please also call with any specific questions about appointments or follow up tests.  

## 2018-05-31 NOTE — Anesthesia Procedure Notes (Signed)
Procedure Name: Intubation Date/Time: 05/31/2018 9:38 AM Performed by: Barrington Ellison, CRNA Pre-anesthesia Checklist: Patient identified, Emergency Drugs available, Suction available and Patient being monitored Patient Re-evaluated:Patient Re-evaluated prior to induction Oxygen Delivery Method: Circle System Utilized Preoxygenation: Pre-oxygenation with 100% oxygen Induction Type: IV induction Ventilation: Mask ventilation without difficulty Laryngoscope Size: Mac and 3 Grade View: Grade I Tube type: Oral Tube size: 7.5 mm Number of attempts: 1 Airway Equipment and Method: Stylet and Oral airway Placement Confirmation: ETT inserted through vocal cords under direct vision,  positive ETCO2 and breath sounds checked- equal and bilateral Secured at: 22 cm Tube secured with: Tape Dental Injury: Teeth and Oropharynx as per pre-operative assessment

## 2018-05-31 NOTE — Transfer of Care (Signed)
Immediate Anesthesia Transfer of Care Note  Patient: Leonard [name]  Procedure(s) Performed: ESOPHAGOGASTRODUODENOSCOPY (EGD) WITH PROPOFOL (N/A ) UPPER ENDOSCOPIC ULTRASOUND (EUS) RADIAL (N/A ) BIOPSY  Patient Location: Endoscopy Unit  Anesthesia Type:General  Level of Consciousness: drowsy  Airway & Oxygen Therapy: Patient Spontanous Breathing  Post-op Assessment: Report given to RN  Post vital signs: Reviewed and stable  Last Vitals:  Vitals Value Taken Time  BP 188/79 05/31/2018 10:55 AM  Temp    Pulse 95 05/31/2018 10:55 AM  Resp 19 05/31/2018 10:55 AM  SpO2 94 % 05/31/2018 10:55 AM  Vitals shown include unvalidated device data.  Last Pain:  Vitals:   05/31/18 0903  TempSrc: Oral  PainSc: 0-No pain         Complications: No apparent anesthesia complications

## 2018-05-31 NOTE — Anesthesia Preprocedure Evaluation (Addendum)
Anesthesia Evaluation  Patient identified by MRN, date of birth, ID band Patient awake    Reviewed: Allergy & Precautions, NPO status , Patient's Chart, lab work & pertinent test results  Airway Mallampati: I  TM Distance: >3 FB Neck ROM: Full    Dental  (+) Edentulous Upper, Edentulous Lower   Pulmonary COPD,  COPD inhaler, former smoker,    Pulmonary exam normal        Cardiovascular hypertension, Pt. on medications and Pt. on home beta blockers + CAD, + CABG, + Peripheral Vascular Disease and +CHF   Rhythm:Regular Rate:Normal     Neuro/Psych negative neurological ROS     GI/Hepatic GERD  Medicated,  Endo/Other  diabetes, Type 2, Insulin DependentHypothyroidism   Renal/GU      Musculoskeletal  (+) Arthritis ,   Abdominal Normal abdominal exam  (+)   Peds  Hematology negative hematology ROS (+)   Anesthesia Other Findings   Reproductive/Obstetrics                            Lab Results  Component Value Date   WBC 6.4 05/10/2018   HGB 9.5 (L) 05/10/2018   HCT 30.6 (L) 05/10/2018   MCV 90.5 05/10/2018   PLT 136 (L) 05/10/2018    Lab Results  Component Value Date   CREATININE 3.10 (H) 05/10/2018   BUN 40 (H) 05/10/2018   NA 138 05/10/2018   K 5.0 05/10/2018   CL 111 05/10/2018   CO2 19 (L) 05/10/2018   Lab Results  Component Value Date   INR 1.19 03/28/2018   INR 1.38 09/23/2017   INR 1.17 07/22/2017   Echo: - Left ventricle: Abnormal global LV strain. The cavity size was   normal. Wall thickness was increased in a pattern of moderate to   severe LVH. Systolic function was normal. The estimated ejection   fraction was in the range of 50% to 55%. Wall motion was normal;   there were no regional wall motion abnormalities. - Aortic root: The aortic root was mildly dilated. - Mitral valve: Mildly calcified annulus. There was mild   regurgitation. - Left atrium: The atrium  was moderately dilated.  Anesthesia Physical Anesthesia Plan  ASA: III  Anesthesia Plan: General   Post-op Pain Management:    Induction: Intravenous  PONV Risk Score and Plan: Ondansetron and Treatment may vary due to age or medical condition  Airway Management Planned: Oral ETT  Additional Equipment: None  Intra-op Plan:   Post-operative Plan: Extubation in OR  Informed Consent: I have reviewed the patients History and Physical, chart, labs and discussed the procedure including the risks, benefits and alternatives for the proposed anesthesia with the patient or authorized representative who has indicated his/her understanding and acceptance.     Dental advisory given  Plan Discussed with: CRNA  Anesthesia Plan Comments:        Anesthesia Quick Evaluation

## 2018-05-31 NOTE — Op Note (Addendum)
[facility] [name] Procedure Date : 05/31/2018 MRN: [id number] Attending MD: Justice Britain , MD Date of Birth: [birth year] CSN: [id number] Age: 68 Admit Type: Outpatient Procedure:                Upper EUS Indications:              Staging of gastric adenocarcinoma Providers:                Justice Britain, MD, Carlyn Reichert, RN, Glori Bickers, RN, Marguerita Merles, Technician, Charolette Child, Technician, Sampson Si, CRNA Referring MD:              Medicines:                General Anesthesia Complications:            No immediate complications. Estimated Blood Loss:     Estimated blood loss was minimal. Procedure:                Pre-Anesthesia Assessment:                           - Prior to the procedure, a History and Physical                            was performed, and patient medications and                            allergies were reviewed. The patient's tolerance of                            previous anesthesia was also reviewed. The risks                            and benefits of the procedure and the sedation                            options and risks were discussed with the patient.                            All questions were answered, and informed consent                            was obtained. Prior Anticoagulants: The patient has                            taken no previous anticoagulant or antiplatelet                            agents. ASA Grade Assessment: III - A patient with  severe systemic disease. After reviewing the risks                            and benefits, the patient was deemed in                            satisfactory condition to undergo the procedure.                           After obtaining informed consent, the endoscope was                            passed under direct vision. Throughout the                             procedure, the patient's blood pressure, pulse, and                            oxygen saturations were monitored continuously. The                            upper EUS was accomplished without difficulty. The                            patient tolerated the procedure. Scope In: Scope Out: Findings:      ENDOSCOPIC FINDING: :      The mid esophagus and distal esophagus were moderately tortuous.      A widely patent and non-obstructing Schatzki ring was found at the       gastroesophageal junction.      A small hiatal hernia was present.      No gross mucosal lesions were noted in the entire esophagus otherwise.      Localized moderately erythematous mucosa without bleeding was found in       the cardia. Biopsies were taken with a cold forceps for histology.      Localized moderately erythematous mucosa without bleeding was found at       the incisura. Biopsies were taken with a cold forceps for histology.      Multiple dispersed, diminutive non-bleeding erosions were found in the       gastric antrum. There were stigmata of recent bleeding. Biopsies were       taken with a cold forceps for histology.      A medium-sized, infiltrative and ulcerated, partially circumferential       (involving one-third of the lumen circumference) mass with no bleeding       and stigmata of recent bleeding was found in the prepyloric region of       the stomach.      A medium-sized infiltrative, polypoid and ulcerated mass with no       bleeding was found in the duodenal bulb.      Diffuse mild mucosal changes characterized by discoloration were found       in the duodenal bulb, in the first portion of the duodenum and in the       second portion of the duodenum - consistent with melanosis.      ENDOSONOGRAPHIC FINDING: :      A  hypoechoic round mass was identified endosonographically in the antrum       of the stomach and in the prepyloric region of the stomach. The mass       measured 45 mm by 22 mm in  maximal cross-sectional diameter. The outer       margins were irregular. There was sonographic evidence suggesting       invasion into the muscularis propria (Layer 4). The mass protruded into       the duodenal bulb and could be seen endoscopically and       ultrasonographically. Within the duodenum, it did not appear to have any       invasion through Layer 4.      Endosonographic images of the stomach in the antrum, body, and cardia       were unremarkable. The wall thickness in the cardia and body was between       1.7-1.9 mm. The wall thickness in the antrum was approximately 2.9 mm in       size until the area of the mass was found in the prepylorus.      No malignant-appearing lymph nodes were visualized in the paracardial       region (level 16), left gastric region (level 17), gastrohepatic       ligament (level 18), celiac region (level 20) and perigastric region.      The celiac region was visualized. Impression:               EGD Impression:                           - Tortuous esophagus. Widely patent and                            non-obstructing Schatzki ring. Small hiatal hernia.                            No other gross mucosal lesions in esophagus.                           - Erythematous mucosa in the cardia. Biopsied.                           - Erythematous mucosa in the incisura. Biopsied.                           - Recently bleeding erosive gastropathy. Biopsied.                           - Malignant gastric tumor in the prepyloric region                            of the stomach invading into the duodenal bulb was                            noted. Previously biopsied.                           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  EUS Impression:                           - A mass was found in the antrum of the stomach and                            in the prepyloric region of the stomach. A tissue                             diagnosis was obtained prior to this exam. This is                            consistent with adenocarcinoma. Based on the EUS                            findings today, there was a loss of interface with                            invasion into the muscularis propria. I could not                            visualize the lesion extending through the                            muscularis mucosa into the adventitia. Thus it was                            staged T2 N0 Mx by endosonographic criteria.                            Previous PET-CT had not found any lymphadenopathy.                           - Endosonographic images of the rest of the stomach                            were unremarkable with very minimal wall thickening                            in the antrum. This does not have the typical                            appearance of a linnitis plastica.                           - No malignant-appearing lymph nodes were                            visualized in the paracardial region (level 16),                            left gastric region (level 17), gastrohepatic  ligament (level 18), celiac region (level 20) and                            perigastric region. Recommendation:           - The patient will be observed post-procedure,                            until all discharge criteria are met.                           - Discharge patient to a nursing home.                           - Patient has a contact number available for                            emergencies. The signs and symptoms of potential                            delayed complications were discussed with the                            patient. Return to normal activities tomorrow.                            Written discharge instructions were provided to the                            patient.                           - Resume previous diet.                           - Observe patient's  clinical course.                           - Await path results.                           - Continue PPI BID.                           - Will discuss case with referring providers in                            effort of determining next steps in therapeutic                            management.                           - The findings and recommendations were discussed                            with the patient. Procedure Code(s):        ---  Professional ---                           [phone], Esophagogastroduodenoscopy, flexible,                            transoral; with endoscopic ultrasound examination                            limited to the esophagus, stomach or duodenum, and                            adjacent structures                           43239, Esophagogastroduodenoscopy, flexible,                            transoral; with biopsy, single or multiple Diagnosis Code(s):        --- Professional ---                           Q39.9, Congenital malformation of esophagus,                            unspecified                           K22.2, Esophageal obstruction                           K44.9, Diaphragmatic hernia without obstruction or                            gangrene                           K31.89, Other diseases of stomach and duodenum                           K92.2, Gastrointestinal hemorrhage, unspecified                           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 All rights reserved. The codes documented in this report are preliminary and upon coder review may  be revised to meet current compliance requirements. Justice Britain, MD 05/31/2018 11:11:41 AM Number of Addenda: 0

## 2018-05-31 NOTE — H&P (Signed)
GASTROENTEROLOGY OUTPATIENT PROCEDURE H&P NOTE   Primary Care Physician: Kathrene Alu, MD  HPI: Leonard [name] is a 68 y.o. male who presents for EGD/EUS.  Past Medical History:  Diagnosis Date  . Acute on chronic diastolic heart failure ([place]) 07/04/2014  . Anemia of renal disease   . Atherosclerotic peripheral vascular disease with gangrene ([place]) 02/14/2017  . CAD (coronary artery disease) 04/06/2013  . Cancer ([place])    skin cancer-  . CHF (congestive heart failure) ([place])   . Chronic anemia 02/28/2016  . Chronic deep vein thrombosis (DVT) of femoral vein of left lower extremity ([place]) 09/03/2016  . Chronic respiratory failure with hypoxia ([place]) 08/25/2017  . CKD (chronic kidney disease) stage 4, GFR 15-29 ml/min (HCC) 01/15/2015   Follows with [facility].  Last visits:  07/23/16: No change in meds. Worried that if we increase diuretics could lead to another AKI  . CKD (chronic kidney disease), stage IV ([place])    07/07/2017  . Claudication ([place])   . Collapse of left talus 01/14/2017  . COPD GOLD III  02/16/2014   Quit smoking 2014  - PFT's  04/06/13   FEV1 1.28 (44 % ) ratio 48  p no % improvement from saba p ?  prior to study with DLCO  43/54 % corrects to 97  % for alv volume - 01/31/2016  After extensive coaching HFA effectiveness =    75% from baseline 0 > changed to sprivia respimat   - PFT's  03/17/2016  FEV1 2.25  (81 % ) ratio 88  p 2 % improvement from saba p spiriva respimat x 2  prior to study with  . Coronary artery disease   . Diabetes ([place])   . Diabetic polyneuropathy associated with type 2 diabetes mellitus ([place]) 01/14/2017  . Diabetic retinopathy ([place]) 11/13/2015   Per Dr. Erenest Rasher eye exam in 10/17/15, mild background diabetic retinopathy  . Fatigue associated with anemia 01/21/2016  . Gangrene of toe ([place])    right 5th/notes 01/04/2013   . HCAP (healthcare-associated pneumonia)   . Hematuria 06/27/2015  . High cholesterol   . History of tobacco use 08/23/2013   Smoked pack per day for 46 years Quit smoking - 02/03/2013   . Hypertension   . Hypothyroidism 11/22/2014  . Idiopathic chronic gout of left foot without tophus   . Iron deficiency anemia 08/03/2015  . Left foot pain 10/29/2016  . Long term (current) use of anticoagulants [Z79.01] 09/09/2016  . 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 Mixed hyperlipidemia 07/17/2008  . Morbid obesity due to excess calories ([place]) 03/17/2016  . PAD (peripheral artery disease) ([place])   . Pleural effusion   . Pneumonitis 07/26/2017  . Pulmonary artery hypertension ([place]) 07/25/2017  . PVD - hx of Rt SFA PTA and s/p Rt 4-5th toe amp Dec 2014 04/05/2013  . 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 Respiratory failure ([place]) 08/13/2017  . Respiratory failure with hypoxia ([place]) 09/23/2017  . 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 Secondary hypertension   . Shortness of breath dyspnea    with exertion  . Skin lesion of face 02/22/2015  . SOB (shortness of breath)   . Type 2 diabetes mellitus with pressure callus ([place]) 12/29/2016  . Type II diabetes mellitus (HCC)    Type II  . Type II diabetes mellitus with complication [facility])    Past Surgical History:  Procedure Laterality Date  . ABDOMINAL AORTOGRAM W/LOWER EXTREMITY N/A 03/12/2017   Procedure:  ABDOMINAL AORTOGRAM W/LOWER EXTREMITY;  Surgeon: Conrad [place], MD;  Location: [place] [facility];  Service: Cardiovascular;  Laterality: N/A;  . AMPUTATION Right 01/28/2013   Procedure: RAY AMPUTATION RIGHT  4th & 5th TOE;  Surgeon: Rosetta Posner, MD;  Location: [place];  Service: Vascular;  Laterality: Right;  . AMPUTATION Left 03/15/2017   Procedure: AMPUTATION ABOVE KNEE;  Surgeon: Waynetta Sandy, MD;  Location: [place];  Service: Vascular;  Laterality: Left;  . ANGIOPLASTY / STENTING FEMORAL Right 01/13/2013   superficial femoral artery x 2 (3 mm x 60 mm, 4 mm x 60 mm)  . AV FISTULA PLACEMENT Left 02/11/2016   Procedure: LEFT UPPER ARM BRACHIAL CEPHALIC  ARTERIOVENOUS (AV) FISTULA CREATION;  Surgeon: Conrad [place], MD;  Location: [place];  Service: Vascular;  Laterality: Left;  . BIOPSY  03/29/2018   Procedure: BIOPSY;  Surgeon: Rush Landmark Telford Nab., MD;  Location: [place];  Service: Gastroenterology;;  . COLONOSCOPY W/ POLYPECTOMY    . CORONARY ARTERY BYPASS GRAFT N/A 04/07/2013   Procedure: CORONARY ARTERY BYPASS GRAFTING (CABG);  Surgeon: Ivin Poot, MD;  Location: [place];  Service: Open Heart Surgery;  Laterality: N/A;  . ESOPHAGOGASTRODUODENOSCOPY (EGD) WITH PROPOFOL N/A 03/29/2018   Procedure: ESOPHAGOGASTRODUODENOSCOPY (EGD) WITH PROPOFOL;  Surgeon: Rush Landmark Telford Nab., MD;  Location: [place];  Service: Gastroenterology;  Laterality: N/A;  . INTRAOPERATIVE TRANSESOPHAGEAL ECHOCARDIOGRAM N/A 04/07/2013   Procedure: INTRAOPERATIVE TRANSESOPHAGEAL ECHOCARDIOGRAM;  Surgeon: Ivin Poot, MD;  Location: [place];  Service: Open Heart Surgery;  Laterality: N/A;  . IR FLUORO GUIDE CV LINE RIGHT  11/04/2017  . IR REMOVAL TUN CV CATH W/O FL  11/27/2017  . IR US GUIDE VASC ACCESS RIGHT  11/04/2017  . LEFT HEART CATHETERIZATION WITH CORONARY ANGIOGRAM N/A 04/05/2013   Procedure: LEFT HEART CATHETERIZATION WITH CORONARY ANGIOGRAM;  Surgeon: Peter M Martinique, MD;  Location: [facility] CATH LAB;  Service: Cardiovascular;  Laterality: N/A;  . LOWER EXTREMITY ANGIOGRAM Bilateral 01/06/2013   Procedure: LOWER EXTREMITY ANGIOGRAM;  Surgeon: Conrad Fellows, MD;  Location: [facility] CATH LAB;  Service: Cardiovascular;  Laterality: Bilateral;  . LOWER EXTREMITY ANGIOGRAM Right 01/13/2013   Procedure: LOWER EXTREMITY ANGIOGRAM;  Surgeon: Conrad [place], MD;  Location: [facility] CATH LAB;  Service: Cardiovascular;  Laterality: Right;  . SKIN CANCER EXCISION  2017  . THROAT SURGERY  1983   polyps  . TONSILLECTOMY AND ADENOIDECTOMY  ~ 1965   Current Facility-Administered Medications  Medication Dose Route Frequency Provider Last Rate Last Dose  . 0.9 %  sodium chloride infusion    Intravenous Continuous Mansouraty, Telford Nab., MD       Facility-Administered Medications Ordered in Other Encounters  Medication Dose Route Frequency Provider Last Rate Last Dose  . sodium chloride flush (NS) 0.9 % injection 10 mL  10 mL Intracatheter PRN Heath Lark, MD       Allergies  Allergen Reactions  . Penicillins Other (See Comments)    Has patient had a PCN reaction causing immediate rash, facial/tongue/throat swelling, SOB or lightheadedness with hypotension: Unknown Has patient had a PCN reaction causing severe rash involving mucus membranes or skin necrosis: Unknown Has patient had a PCN reaction that required hospitalization: Unknown Has patient had a PCN reaction occurring within the last 10 years: Unknown If all of the above answers are "NO", then may proceed with Cephalosporin use.    Family History  Problem Relation Age of Onset  . Cancer Mother  lung cancer  . Hypertension Mother   . Cancer Sister        patient thinks it was uterine cancer  . Hypertension Sister   . Heart attack Sister   . Stroke Neg Hx    Social History   Socioeconomic History  . Marital status: Single    Spouse name: Not on file  . Number of children: 0  . Years of education: Not on file  . Highest education level: Not on file  Occupational History  . Not on file  Social Needs  . Financial resource strain: Not on file  . Food insecurity:    Worry: Not on file    Inability: Not on file  . Transportation needs:    Medical: No    Non-medical: No  Tobacco Use  . Smoking status: Former Smoker    Packs/day: 1.00    Years: 46.00    Pack years: 46.00    Types: Cigarettes    Start date: 02/04/1967    Last attempt to quit: 02/03/2013    Years since quitting: 5.3  . Smokeless tobacco: Never Used  Substance and Sexual Activity  . Alcohol use: No    Alcohol/week: 0.0 standard drinks  . Drug use: No  . Sexual activity: Not Currently  Lifestyle  . Physical activity:    Days per  week: Not on file    Minutes per session: Not on file  . Stress: Not on file  Relationships  . Social connections:    Talks on phone: Not on file    Gets together: Not on file    Attends religious service: Not on file    Active member of club or organization: Not on file    Attends meetings of clubs or organizations: Not on file    Relationship status: Not on file  . Intimate partner violence:    Fear of current or ex partner: Not on file    Emotionally abused: Not on file    Physically abused: Not on file    Forced sexual activity: Not on file  Other Topics Concern  . Not on file  Social History Narrative   Retired - former Sports coach.   Patient in skilled nursing facility.    Physical Exam: Vital signs in last 24 hours: Temp:  [98 F (36.7 C)] 98 F (36.7 C) (04/27 0903) Pulse Rate:  [94] 94 (04/27 0903) Resp:  [16] 16 (04/27 0903) BP: (194)/(83) 194/83 (04/27 0903) SpO2:  [100 %] 100 % (04/27 0903) Weight:  [79.4 kg] 79.4 kg (04/27 0908)   GEN: NAD EYE: Sclerae anicteric ENT: MMM GI: TTP in the midabdomen NEURO:  Alert & Oriented x 3  Lab Results: Recent Labs    05/31/18 0908  HGB 10.5*  HCT 31.0*   BMET Recent Labs    05/31/18 0908  NA 143  K 4.3  GLUCOSE 192*   LFT No results for input(s): PROT, ALBUMIN, AST, ALT, ALKPHOS, BILITOT, BILIDIR, IBILI in the last 72 hours. PT/INR No results for input(s): LABPROT, INR in the last 72 hours.   Impression / Plan: This is a 68 y.o.male who presents for EGD/EUS.   The risks of EUS including bleeding, infection, aspiration pneumonia and intestinal perforation were discussed as was the possibility it may not give a definitive diagnosis.  If a biopsy of the pancreas is done as part of the EUS, there is an additional risk of pancreatitis at the rate of about 1%.  It  was explained that procedure related pancreatitis is typically mild, although can be severe and even life threatening, which is why we do not  perform random pancreatic biopsies and only biopsy a lesion we feel is concerning enough to warrant the risk.  The risks and benefits of endoscopic evaluation were discussed with the patient; these include but are not limited to the risk of perforation, infection, bleeding, missed lesions, lack of diagnosis, severe illness requiring hospitalization, as well as anesthesia and sedation related illnesses.  The patient is agreeable to proceed.    Justice Britain, MD [facility] Office # [phone]

## 2018-06-01 ENCOUNTER — Encounter (HOSPITAL_COMMUNITY): Payer: Self-pay | Admitting: Gastroenterology

## 2018-06-01 NOTE — Anesthesia Postprocedure Evaluation (Signed)
Anesthesia Post Note  Patient: Leonard [name]  Procedure(s) Performed: ESOPHAGOGASTRODUODENOSCOPY (EGD) WITH PROPOFOL (N/A ) UPPER ENDOSCOPIC ULTRASOUND (EUS) RADIAL (N/A ) BIOPSY     Patient location during evaluation: PACU Anesthesia Type: General Level of consciousness: awake and alert Pain management: pain level controlled Vital Signs Assessment: post-procedure vital signs reviewed and stable Respiratory status: spontaneous breathing, nonlabored ventilation, respiratory function stable and patient connected to nasal cannula oxygen Cardiovascular status: blood pressure returned to baseline and stable Postop Assessment: no apparent nausea or vomiting Anesthetic complications: no    Last Vitals:  Vitals:   05/31/18 1105 05/31/18 1115  BP: (!) 179/79 (!) 202/74  Pulse: 74 76  Resp: 17 15  Temp:    SpO2: 94% 96%    Last Pain:  Vitals:   05/31/18 1115  TempSrc:   PainSc: 0-No pain                 Effie Berkshire

## 2018-06-03 ENCOUNTER — Telehealth: Payer: Self-pay | Admitting: Radiation Oncology

## 2018-06-03 ENCOUNTER — Encounter: Payer: Self-pay | Admitting: Gastroenterology

## 2018-06-03 NOTE — Telephone Encounter (Signed)
I spoke with the patient's niece and let her know I left a message at [facility] for referral coordinator to arrange his cardiac clearance evaluation at the heart clinic. If he is not a surgical candidate, we would move forward with chemoRT.

## 2018-06-07 ENCOUNTER — Encounter (HOSPITAL_COMMUNITY): Payer: Medicare Other

## 2018-06-07 DIAGNOSIS — C801 Malignant (primary) neoplasm, unspecified: Secondary | ICD-10-CM | POA: Diagnosis not present

## 2018-06-07 DIAGNOSIS — I739 Peripheral vascular disease, unspecified: Secondary | ICD-10-CM | POA: Diagnosis not present

## 2018-06-07 DIAGNOSIS — I5032 Chronic diastolic (congestive) heart failure: Secondary | ICD-10-CM | POA: Diagnosis not present

## 2018-06-07 DIAGNOSIS — N184 Chronic kidney disease, stage 4 (severe): Secondary | ICD-10-CM | POA: Diagnosis not present

## 2018-06-10 ENCOUNTER — Other Ambulatory Visit: Payer: Self-pay

## 2018-06-10 ENCOUNTER — Ambulatory Visit (HOSPITAL_COMMUNITY)
Admission: RE | Admit: 2018-06-10 | Discharge: 2018-06-10 | Disposition: A | Discharge status: Home / Self Care | Payer: Medicare Other | Source: Ambulatory Visit | Attending: Internal Medicine | Admitting: Internal Medicine

## 2018-06-11 ENCOUNTER — Ambulatory Visit (HOSPITAL_COMMUNITY)
Admission: RE | Admit: 2018-06-11 | Discharge: 2018-06-11 | Disposition: A | Discharge status: Home / Self Care | Payer: Medicare Other | Source: Ambulatory Visit | Attending: Cardiology | Admitting: Cardiology

## 2018-06-11 ENCOUNTER — Other Ambulatory Visit: Payer: Self-pay

## 2018-06-11 DIAGNOSIS — N184 Chronic kidney disease, stage 4 (severe): Secondary | ICD-10-CM

## 2018-06-11 DIAGNOSIS — C801 Malignant (primary) neoplasm, unspecified: Secondary | ICD-10-CM

## 2018-06-11 DIAGNOSIS — I5032 Chronic diastolic (congestive) heart failure: Secondary | ICD-10-CM

## 2018-06-11 DIAGNOSIS — I2510 Atherosclerotic heart disease of native coronary artery without angina pectoris: Secondary | ICD-10-CM

## 2018-06-11 MED ORDER — AMLODIPINE BESYLATE 10 MG PO TABS: 10.0000 mg | ORAL_TABLET | Freq: Every day | ORAL | 3 refills | Status: DC

## 2018-06-11 NOTE — Addendum Note (Signed)
Encounter addended by: Marlise Eves, RN on: 06/11/2018 1:45 PM  Actions taken: Visit diagnoses modified, Diagnosis association updated, Order list changed, Clinical Note Signed

## 2018-06-11 NOTE — Progress Notes (Signed)
Heart Failure TeleHealth Note  Due to national recommendations of social distancing due to [place] 19, Audio/video telehealth visit is felt to be most appropriate for this patient at this time.  See MyChart message from today for patient consent regarding telehealth for [facility].  Date:  06/11/2018   ID:  Leonard [name], DOB July 08, 1950, MRN [id number]  Location: Home  Provider location: [place] [facility] Type of Visit: Established patient   PCP:  Kathrene Alu, MD  Cardiologist:  Dr. Aundra Dubin Chief Complaint: Shortness of breath   History of Present Illness: Leonard [name] is a 68 y.o. male who presents via audio/video conferencing for a telehealth visit today.     he denies symptoms worrisome for COVID 19.   Patient has history of CAD s/p CABG, PAD with left AKA in 8/86, chronic diastolic CHF, CKD stage IV, COPD on home oxygen, and HTN.  He has had multiple admissions for diastolic CHF.  Echo in 5/17 showed EF 60-65% with grade II diastolic dysfunction.  He also has COPD with PFTs showing severe obstruction.  He is not on home oxygen.   Admitted 4/84-7/20/72 with A/C diastolic HF. Initial poor response to IV lasix with worsening creatinine. HF team consulted. Diuresed 20 lbs with high dose lasix and metolazone. AKI resolved prior to discharge. Transitioned to torsemide 40 mg daily (previously on 20 mg daily). Chest CT showed large right pleural effusion. Did not get thoracentesis. He was also started on aranesp for anemia. DC weight: 164 lbs. DC'd to SNF.   Admitted 8/21-8/29/19 with A/C HF. He was initially hypoxic with AMS, but improved with BiPAP and diuresis. Diuresed with IV lasix. He was initially transitioned to torsemide 80 mg daily, but had mild AKI, so was decreased to torsemide 80 mg every other day He had positive troponins (peaked at 4). Myoview showed no ischemia. Palliative consulted and he remained full code. He received blood for anemia with no  obvious source of bleeding. DC weight: 159 lbs. Discharged to SNF and remains in SNF currently ([facility]).   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  Has been seen by GI and surgery, ideally would have distal gastrectomy ([name]).  Cardiology asked to reassess for surgical risk.   He does not walk, does not have a prosthesis that will fit his leg.  However, he seems fairly mobile with wheelchair.  He is able to get himself down the hall to dining room with only mild dyspnea.  Able to do his transfers.  No orthopnea/PND.  No right leg pain or ulcerations.  BP remains high.  Weight has been stable in SNF per his nurse.  No right ankle edema. No chest pain.  He still smokes a few cigarettes/day. He is not on home oxygen. I spoke with both the patient and his nurse at the SNF today.   Labs (8/17): K 3.9, creatinine 2.33, HCT 28.5, LDL 46 Labs (11/17): hgb 9, K 4.4, creatinine 3, BNP 64 Labs 03/24/2016: K 3.5 Creatinine 2.48.  Labs 05/09/2016: K 4.3 Creatinine 3.13 Hgb 9.0  Labs (10/19): K 3, creatinine 2.02 Labs (1/20): LDL 37 Labs (4/20): K 5, creatinine 3.10, hgb 9.4  PMH: 1. Chronic diastolic CHF: Multiple recent admissions.  Echo (5/17) with EF 60-65%, grade II diastolic dysfunction, normal RV size and systolic function.  - Echo (8/19): EF 50-55%, moderate-severe LVH.  2. PAD: Right femoral PCI in 12/14. Peripheral arterial dopplers  in 6/17 with occluded right SFA.  He follows with [facility].  - Left AKA in 2/19.  - Peripheral arterial dopplers (1/20): Occluded right distal SFA and peroneal, no change from past.  3. HTN: Renal artery dopplers negative in 5/17.  4. Hypothyroidism 5. CKD stage IV: Follows with Dr. Marval Regal. 6. CAD: s/p CABG x 3 in 3/15.  - Myoview 09/25/17: EF 45%, no ischemia 7. OSA: Mild, not on CPAP.  8. COPD: PFTs (3/15) with FVC 77%, FEV1 47%, ratio 59%, TLC 55% => severe obstruction.  Still smokes about 3 cigarettes/day.   9. Type  II diabetes 10. MGUS 11. Hyperlipidemia 12. Anemia of renal disease 13. Carotid dopplers (1/20): 1-39% BICA stenosis.  14. Gastric adenocarcinoma.   Current Outpatient Medications  Medication Sig Dispense Refill  . acetaminophen (TYLENOL) 325 MG tablet Take 650 mg by mouth every 8 (eight) hours as needed for mild pain or fever.     Marland Kitchen amLODipine (NORVASC) 5 MG tablet Take 1 tablet (5 mg total) by mouth daily. 30 tablet 0  . aspirin EC 81 MG tablet Take 1 tablet (81 mg total) by mouth daily. 150 tablet 0  . atorvastatin (LIPITOR) 40 MG tablet TAKE ONE (1) TABLET BY MOUTH EVERY DAY AT 6:00PM (Patient taking differently: Take 40 mg by mouth daily at 6 PM. ) 90 tablet 3  . Blood Glucose Monitoring Suppl (ONETOUCH VERIO FLEX SYSTEM) w/Device KIT 1 kit by Does not apply route daily. 1 kit 3  . carvedilol (COREG) 12.5 MG tablet Take 1 tablet (12.5 mg total) by mouth 2 (two) times daily with a meal. 60 tablet 0  . cholecalciferol (VITAMIN D) 25 MCG (1000 UT) tablet Take 2,000 Units by mouth 3 (three) times daily.    . cloNIDine (CATAPRES - DOSED IN MG/24 HR) 0.2 mg/24hr patch Place 1 patch (0.2 mg total) onto the skin once a week. (Patient taking differently: Place 0.2 mg onto the skin every Tuesday. ) 4 patch 0  . Darbepoetin Alfa (ARANESP) 40 MCG/0.4ML SOSY injection Inject 0.4 mLs (40 mcg total) into the skin every 30 (thirty) days. 8.4 mL 2  . doxazosin (CARDURA) 8 MG tablet Take 1 tablet (8 mg total) by mouth every evening.    . feeding supplement, ENSURE ENLIVE, (ENSURE ENLIVE) LIQD Take 237 mLs by mouth 3 (three) times daily between meals. 237 mL 12  . ferrous sulfate (FEROSUL) 325 (65 FE) MG tablet Take 1 tablet (325 mg total) by mouth daily at 12 noon. (Patient taking differently: Take 325 mg by mouth daily. ) 30 tablet 0  . glucose blood (ONETOUCH VERIO) test strip Use as instructed to test three times daily. ICD-10 code: E11.22 100 each 12  . hydrALAZINE (APRESOLINE) 100 MG tablet TAKE ONE  TABLET BY MOUTH EVERY EIGHT HOURS (Patient taking differently: Take 100 mg by mouth every 8 (eight) hours. ) 90 tablet 1  . insulin aspart (NOVOLOG) 100 UNIT/ML injection Inject 0-10 Units into the skin See admin instructions. Three times daily per sliding scale: <150 = 0 units 151-200 = 2 units 201-250 = 4 units 251-300 = 6 units 301-350 = 8 units 351-400 = 10 units Call PCP if CBG <70 or >400    . ipratropium-albuterol (DUONEB) 0.5-2.5 (3) MG/3ML SOLN Take 3 mLs by nebulization every 6 (six) hours as needed (shortness of breath and wheezing).     . isosorbide mononitrate (IMDUR) 120 MG 24 hr tablet Take 1 tablet (120 mg total) by mouth daily. 30 tablet  0  . levothyroxine (SYNTHROID, LEVOTHROID) 100 MCG tablet Take 100 mcg by mouth daily before breakfast.     . LONHALA MAGNAIR STARTER KIT 25 MCG/ML SOLN Inhale 1 mL into the lungs 2 (two) times daily.    . nitroGLYCERIN (NITROSTAT) 0.4 MG SL tablet Place 1 tablet (0.4 mg total) under the tongue every 5 (five) minutes as needed for chest pain. 30 tablet 12  . ONETOUCH DELICA LANCETS 93G MISC 1 each by Does not apply route 3 (three) times daily. ICD-10 code: E11.22 100 each 12  . pantoprazole (PROTONIX) 40 MG tablet Take 1 tablet (40 mg total) by mouth 2 (two) times daily.    . polyethylene glycol (MIRALAX / GLYCOLAX) packet Take 17 g by mouth daily as needed for mild constipation or moderate constipation. (Patient taking differently: Take 17 g by mouth daily as needed for mild constipation. ) 14 each 0  . potassium chloride SA (K-DUR,KLOR-CON) 20 MEQ tablet Take 2 tablets (40 mEq total) by mouth daily. 60 tablet 11  . torsemide (DEMADEX) 20 MG tablet Take 2 tablets (40 mg total) by mouth daily. 120 tablet 11   No current facility-administered medications for this encounter.    Facility-Administered Medications Ordered in Other Encounters  Medication Dose Route Frequency Provider Last Rate Last Dose  . sodium chloride flush (NS) 0.9 % injection  10 mL  10 mL Intracatheter PRN Heath Lark, MD        Allergies:   Penicillins   Social History:  The patient  reports that he quit smoking about 5 years ago. His smoking use included cigarettes. He started smoking about 51 years ago. He has a 46.00 pack-year smoking history. He has never used smokeless tobacco. He reports that he does not drink alcohol or use drugs.   Family History:  The patient's family history includes Cancer in his mother and sister; Heart attack in his sister; Hypertension in his mother and sister.   ROS:  Please see the history of present illness.   All other systems are personally reviewed and negative.   Exam:  (Video/Tele Health Call; Exam is subjective and or/visual.) BP 148/74, HR 82, weight 177-179 lbs range General:  Speaks in full sentences. No resp difficulty. Lungs: Normal respiratory effort with conversation.  Abdomen: Non-distended per patient report Extremities: Pt denies edema. S/p left AKA.  Neuro: Alert & oriented x 3.   Recent Labs: 07/10/2017: TSH 3.338 07/25/2017: Magnesium 2.5 03/28/2018: B Natriuretic Peptide 171.7 03/29/2018: ALT 12 05/10/2018: BUN 40; Creatinine, Ser 3.10; Platelets 136 05/31/2018: Hemoglobin 10.5; Potassium 4.3; Sodium 143  Personally reviewed   Wt Readings from Last 3 Encounters:  05/31/18 79.4 kg (175 lb)  05/03/18 77.6 kg (171 lb)  04/26/18 77.6 kg (171 lb)      ASSESSMENT AND PLAN:  1. Chronic diastolic CHF:  Echo (1/82) with EF 50-55%, moderate-severe LVH.  NYHA class II-III symptoms though not very active.  He is able to wheel his wheelchair around SNF.  Weight has been stable.   - Continue torsemide 40 mg daily with KCl 40 mEq daily.  I will arrange for BMET.  2. HTN: BP remains high.  HTN probably explains his LVH.  - Continue on hydralazine 100 mg TID, imdur 120 mg daily, cardura 8 mg daily,coreg 12.5 mg BID,and clonidine patch 0.2 mg weekly. - Increase amlodipine to 10 mg daily.  3. CAD s.p CABG: No chest  pain.  Myoview 09/25/17 showed EF 45%, no ischemia.  - Continue ASA  81 daily.  - Continue atorvastatin, good lipids in 1/20.   - He needs distal gastrectomy, will be high risk for surgery with CKD stage IV, severe PAD, CAD s/p CABG, severe COPD, and diastolic HF.  However, cancer will be most effectively treated with surgery.  As he is not very active, I think it would be safest to arrange Lexiscan Cardiolite to assess for ischemia.  If this is low risk, think he could proceed with surgery from a cardiac perspective.  4. COPD: Gold stage III. Severe obstruction on prior PFTs. No longer on O2 at home.  Still smokes about 3 cigarettes/day.  - I strongly encouraged him to quit smoking, especially important pre-operatively if he has gastric surgery.   5. CKD: Stage IV. Has L arm AVF. Follows with Dr Marval Regal.  - I will arrange for BMET.  6. Hx of DVT: Diagnosed 08/30/16.  No longer on anticoagulation.   7. PAD: s/p L AKA for extensive LLE wounds with no re-vascularization options in 03/2017 by Dr. Donzetta Matters.  He has severe but stable disease on the right as well based on 1/20 peripheral arterial dopplers.  - Continue [facility] followup.   8. Carotid bruit: Nonobstructive disease on 1/20 dopplers.  9. Pulmonary HTN:  PA peak pressure 63 mmHg on Echo 07/20/17. Suspect primarily WHO Group III and WHO Group II pulmonary HTN.  10. Preoperative evaluation: Patient is at high risk for complications from gastric surgery.  However, surgery would be life-saving with gastric adenocarcinoma.  As above, given poor mobility, CAD, and PAD, I think that risk stratification via Lexiscan Cardiolite would be reasonable (will arrange).  If low risk or normal, I think that he could proceed with surgery from a cardiac perspective.  However, he also has severe COPD and CKD stage IV, both of which will increase his overall risk for surgery.   COVID screen The patient does not have any symptoms that suggest any further testing/ screening at  this time.  Social distancing reinforced today.  Patient Risk: After full review of this patients clinical status, I feel that they are at moderate risk for cardiac decompensation at this time.  Relevant cardiac medications were reviewed at length with the patient today. The patient does not have concerns regarding their medications at this time.   Recommended follow-up:  2 months  Today, I have spent 25 minutes with the patient with telehealth technology discussing the above issues .    Signed, Loralie Champagne, MD  06/11/2018 11:44 AM  [facility] [place] [place] [address] [facility] [place] [address] [phone] (office) [phone] (fax)

## 2018-06-11 NOTE — Progress Notes (Signed)
Attempted to call the nurse case manager to review instructions. Line has no voicemail box. Attempted to call pt's nurse. Per staff member at [facility] pt's nurse is on lunch. Sent fax scripts for meds, labs and f/u.

## 2018-06-11 NOTE — Patient Instructions (Addendum)
Lab work will need to be drawn at your [facility].  INCREASE Amlodipine to 10mg  daily  Your physician has requested that you have a lexiscan myoview. For further information please visit [email or web address]. someone will be in contact with you in order to schedule this.  Please follow up with Dr. Aundra Dubin in 2 months. This is scheduled for ...tbd

## 2018-06-12 DIAGNOSIS — D649 Anemia, unspecified: Secondary | ICD-10-CM | POA: Diagnosis not present

## 2018-06-12 DIAGNOSIS — Z79899 Other long term (current) drug therapy: Secondary | ICD-10-CM | POA: Diagnosis not present

## 2018-06-14 DIAGNOSIS — K920 Hematemesis: Secondary | ICD-10-CM | POA: Diagnosis not present

## 2018-06-14 DIAGNOSIS — D002 Carcinoma in situ of stomach: Secondary | ICD-10-CM | POA: Diagnosis not present

## 2018-06-14 DIAGNOSIS — C801 Malignant (primary) neoplasm, unspecified: Secondary | ICD-10-CM | POA: Diagnosis not present

## 2018-06-21 ENCOUNTER — Telehealth: Payer: Self-pay | Admitting: Radiation Oncology

## 2018-06-21 DIAGNOSIS — Z79899 Other long term (current) drug therapy: Secondary | ICD-10-CM | POA: Diagnosis not present

## 2018-06-21 DIAGNOSIS — E559 Vitamin D deficiency, unspecified: Secondary | ICD-10-CM | POA: Diagnosis not present

## 2018-06-21 NOTE — Telephone Encounter (Signed)
The patient's niece called wanting to know if her uncle's cardiac testing had been performed. It doesn't appear so and I've encouraged her to follow up with the facility and cardiology.

## 2018-06-23 ENCOUNTER — Emergency Department (HOSPITAL_COMMUNITY): Payer: Medicare Other

## 2018-06-23 ENCOUNTER — Inpatient Hospital Stay (HOSPITAL_COMMUNITY)
Admission: EM | Admit: 2018-06-23 | Discharge: 2018-06-27 | DRG: 374 | Disposition: A | Discharge status: Skilled Nursing Facility | Payer: Medicare Other | Attending: Family Medicine | Admitting: Family Medicine

## 2018-06-23 ENCOUNTER — Other Ambulatory Visit: Payer: Self-pay

## 2018-06-23 DIAGNOSIS — D62 Acute posthemorrhagic anemia: Secondary | ICD-10-CM | POA: Diagnosis present

## 2018-06-23 DIAGNOSIS — R4182 Altered mental status, unspecified: Secondary | ICD-10-CM | POA: Diagnosis not present

## 2018-06-23 DIAGNOSIS — I2510 Atherosclerotic heart disease of native coronary artery without angina pectoris: Secondary | ICD-10-CM | POA: Diagnosis present

## 2018-06-23 DIAGNOSIS — Z85828 Personal history of other malignant neoplasm of skin: Secondary | ICD-10-CM

## 2018-06-23 DIAGNOSIS — J9611 Chronic respiratory failure with hypoxia: Secondary | ICD-10-CM | POA: Diagnosis present

## 2018-06-23 DIAGNOSIS — D631 Anemia in chronic kidney disease: Secondary | ICD-10-CM | POA: Diagnosis present

## 2018-06-23 DIAGNOSIS — M2550 Pain in unspecified joint: Secondary | ICD-10-CM | POA: Diagnosis not present

## 2018-06-23 DIAGNOSIS — I130 Hypertensive heart and chronic kidney disease with heart failure and stage 1 through stage 4 chronic kidney disease, or unspecified chronic kidney disease: Secondary | ICD-10-CM | POA: Diagnosis present

## 2018-06-23 DIAGNOSIS — C169 Malignant neoplasm of stomach, unspecified: Secondary | ICD-10-CM | POA: Diagnosis not present

## 2018-06-23 DIAGNOSIS — R404 Transient alteration of awareness: Secondary | ICD-10-CM | POA: Diagnosis not present

## 2018-06-23 DIAGNOSIS — J449 Chronic obstructive pulmonary disease, unspecified: Secondary | ICD-10-CM | POA: Diagnosis present

## 2018-06-23 DIAGNOSIS — Z880 Allergy status to penicillin: Secondary | ICD-10-CM | POA: Diagnosis not present

## 2018-06-23 DIAGNOSIS — E039 Hypothyroidism, unspecified: Secondary | ICD-10-CM | POA: Diagnosis present

## 2018-06-23 DIAGNOSIS — N184 Chronic kidney disease, stage 4 (severe): Secondary | ICD-10-CM | POA: Diagnosis not present

## 2018-06-23 DIAGNOSIS — Z89612 Acquired absence of left leg above knee: Secondary | ICD-10-CM

## 2018-06-23 DIAGNOSIS — E11319 Type 2 diabetes mellitus with unspecified diabetic retinopathy without macular edema: Secondary | ICD-10-CM | POA: Diagnosis present

## 2018-06-23 DIAGNOSIS — J09X2 Influenza due to identified novel influenza A virus with other respiratory manifestations: Secondary | ICD-10-CM | POA: Diagnosis present

## 2018-06-23 DIAGNOSIS — E1142 Type 2 diabetes mellitus with diabetic polyneuropathy: Secondary | ICD-10-CM | POA: Diagnosis present

## 2018-06-23 DIAGNOSIS — Z89421 Acquired absence of other right toe(s): Secondary | ICD-10-CM

## 2018-06-23 DIAGNOSIS — R58 Hemorrhage, not elsewhere classified: Secondary | ICD-10-CM | POA: Diagnosis not present

## 2018-06-23 DIAGNOSIS — C170 Malignant neoplasm of duodenum: Secondary | ICD-10-CM | POA: Diagnosis present

## 2018-06-23 DIAGNOSIS — E782 Mixed hyperlipidemia: Secondary | ICD-10-CM | POA: Diagnosis present

## 2018-06-23 DIAGNOSIS — H9190 Unspecified hearing loss, unspecified ear: Secondary | ICD-10-CM | POA: Diagnosis present

## 2018-06-23 DIAGNOSIS — R1111 Vomiting without nausea: Secondary | ICD-10-CM | POA: Diagnosis not present

## 2018-06-23 DIAGNOSIS — K922 Gastrointestinal hemorrhage, unspecified: Secondary | ICD-10-CM | POA: Diagnosis not present

## 2018-06-23 DIAGNOSIS — I5032 Chronic diastolic (congestive) heart failure: Secondary | ICD-10-CM | POA: Diagnosis present

## 2018-06-23 DIAGNOSIS — R41841 Cognitive communication deficit: Secondary | ICD-10-CM | POA: Diagnosis present

## 2018-06-23 DIAGNOSIS — I739 Peripheral vascular disease, unspecified: Secondary | ICD-10-CM | POA: Diagnosis not present

## 2018-06-23 DIAGNOSIS — Z7401 Bed confinement status: Secondary | ICD-10-CM | POA: Diagnosis not present

## 2018-06-23 DIAGNOSIS — Z86718 Personal history of other venous thrombosis and embolism: Secondary | ICD-10-CM

## 2018-06-23 DIAGNOSIS — R112 Nausea with vomiting, unspecified: Secondary | ICD-10-CM | POA: Diagnosis not present

## 2018-06-23 DIAGNOSIS — Z7984 Long term (current) use of oral hypoglycemic drugs: Secondary | ICD-10-CM

## 2018-06-23 DIAGNOSIS — K254 Chronic or unspecified gastric ulcer with hemorrhage: Secondary | ICD-10-CM | POA: Diagnosis present

## 2018-06-23 DIAGNOSIS — C164 Malignant neoplasm of pylorus: Secondary | ICD-10-CM | POA: Diagnosis not present

## 2018-06-23 DIAGNOSIS — Z01818 Encounter for other preprocedural examination: Secondary | ICD-10-CM | POA: Diagnosis not present

## 2018-06-23 DIAGNOSIS — Z1159 Encounter for screening for other viral diseases: Secondary | ICD-10-CM | POA: Diagnosis not present

## 2018-06-23 DIAGNOSIS — Z66 Do not resuscitate: Secondary | ICD-10-CM | POA: Diagnosis present

## 2018-06-23 DIAGNOSIS — D638 Anemia in other chronic diseases classified elsewhere: Secondary | ICD-10-CM | POA: Diagnosis present

## 2018-06-23 DIAGNOSIS — I159 Secondary hypertension, unspecified: Secondary | ICD-10-CM | POA: Diagnosis present

## 2018-06-23 DIAGNOSIS — Z87891 Personal history of nicotine dependence: Secondary | ICD-10-CM

## 2018-06-23 DIAGNOSIS — I959 Hypotension, unspecified: Secondary | ICD-10-CM | POA: Diagnosis not present

## 2018-06-23 DIAGNOSIS — Z79899 Other long term (current) drug therapy: Secondary | ICD-10-CM

## 2018-06-23 DIAGNOSIS — E1151 Type 2 diabetes mellitus with diabetic peripheral angiopathy without gangrene: Secondary | ICD-10-CM | POA: Diagnosis present

## 2018-06-23 DIAGNOSIS — R1110 Vomiting, unspecified: Secondary | ICD-10-CM | POA: Diagnosis not present

## 2018-06-23 DIAGNOSIS — Z8249 Family history of ischemic heart disease and other diseases of the circulatory system: Secondary | ICD-10-CM

## 2018-06-23 DIAGNOSIS — E1122 Type 2 diabetes mellitus with diabetic chronic kidney disease: Secondary | ICD-10-CM | POA: Diagnosis present

## 2018-06-23 DIAGNOSIS — M6281 Muscle weakness (generalized): Secondary | ICD-10-CM | POA: Diagnosis present

## 2018-06-23 DIAGNOSIS — Z7982 Long term (current) use of aspirin: Secondary | ICD-10-CM

## 2018-06-23 DIAGNOSIS — D649 Anemia, unspecified: Secondary | ICD-10-CM

## 2018-06-23 DIAGNOSIS — Z951 Presence of aortocoronary bypass graft: Secondary | ICD-10-CM

## 2018-06-23 DIAGNOSIS — D472 Monoclonal gammopathy: Secondary | ICD-10-CM | POA: Diagnosis present

## 2018-06-23 DIAGNOSIS — M1A0720 Idiopathic chronic gout, left ankle and foot, without tophus (tophi): Secondary | ICD-10-CM | POA: Diagnosis present

## 2018-06-23 DIAGNOSIS — Z801 Family history of malignant neoplasm of trachea, bronchus and lung: Secondary | ICD-10-CM

## 2018-06-23 DIAGNOSIS — Z794 Long term (current) use of insulin: Secondary | ICD-10-CM

## 2018-06-23 DIAGNOSIS — R29898 Other symptoms and signs involving the musculoskeletal system: Secondary | ICD-10-CM | POA: Diagnosis not present

## 2018-06-23 DIAGNOSIS — I2721 Secondary pulmonary arterial hypertension: Secondary | ICD-10-CM | POA: Diagnosis present

## 2018-06-23 DIAGNOSIS — R531 Weakness: Secondary | ICD-10-CM | POA: Diagnosis not present

## 2018-06-23 DIAGNOSIS — Z20828 Contact with and (suspected) exposure to other viral communicable diseases: Secondary | ICD-10-CM | POA: Diagnosis not present

## 2018-06-23 LAB — CBC WITH DIFFERENTIAL/PLATELET
Abs Immature Granulocytes: 0.06 10*3/uL (ref 0.00–0.07)
Basophils Absolute: 0 10*3/uL (ref 0.0–0.1)
Basophils Relative: 0 %
Eosinophils Absolute: 0.3 10*3/uL (ref 0.0–0.5)
Eosinophils Relative: 4 %
HCT: 13.2 % — ABNORMAL LOW (ref 39.0–52.0)
Hemoglobin: 4 g/dL — CL (ref 13.0–17.0)
Immature Granulocytes: 1 %
Lymphocytes Relative: 11 %
Lymphs Abs: 0.8 10*3/uL (ref 0.7–4.0)
MCH: 29.2 pg (ref 26.0–34.0)
MCHC: 30.3 g/dL (ref 30.0–36.0)
MCV: 96.4 fL (ref 80.0–100.0)
Monocytes Absolute: 0.5 10*3/uL (ref 0.1–1.0)
Monocytes Relative: 7 %
Neutro Abs: 5.5 10*3/uL (ref 1.7–7.7)
Neutrophils Relative %: 77 %
Platelets: 124 10*3/uL — ABNORMAL LOW (ref 150–400)
RBC: 1.37 MIL/uL — ABNORMAL LOW (ref 4.22–5.81)
RDW: 16.7 % — ABNORMAL HIGH (ref 11.5–15.5)
WBC: 7.1 10*3/uL (ref 4.0–10.5)
nRBC: 0 % (ref 0.0–0.2)

## 2018-06-23 LAB — COMPREHENSIVE METABOLIC PANEL
ALT: 8 U/L (ref 0–44)
AST: 10 U/L — ABNORMAL LOW (ref 15–41)
Albumin: 2.7 g/dL — ABNORMAL LOW (ref 3.5–5.0)
Alkaline Phosphatase: 96 U/L (ref 38–126)
Anion gap: 10 (ref 5–15)
BUN: 74 mg/dL — ABNORMAL HIGH (ref 8–23)
CO2: 20 mmol/L — ABNORMAL LOW (ref 22–32)
Calcium: 8.1 mg/dL — ABNORMAL LOW (ref 8.9–10.3)
Chloride: 109 mmol/L (ref 98–111)
Creatinine, Ser: 3.5 mg/dL — ABNORMAL HIGH (ref 0.61–1.24)
GFR calc Af Amer: 20 mL/min — ABNORMAL LOW (ref >60)
GFR calc non Af Amer: 17 mL/min — ABNORMAL LOW (ref >60)
Glucose, Bld: 234 mg/dL — ABNORMAL HIGH (ref 70–99)
Potassium: 4.5 mmol/L (ref 3.5–5.1)
Sodium: 139 mmol/L (ref 135–145)
Total Bilirubin: 0.5 mg/dL (ref 0.3–1.2)
Total Protein: 5.2 g/dL — ABNORMAL LOW (ref 6.5–8.1)

## 2018-06-23 LAB — SARS CORONAVIRUS 2 BY RT PCR (HOSPITAL ORDER, PERFORMED IN ~~LOC~~ HOSPITAL LAB): SARS Coronavirus 2: NEGATIVE

## 2018-06-23 LAB — PROTIME-INR
INR: 1.1 (ref 0.8–1.2)
Prothrombin Time: 14.5 seconds (ref 11.4–15.2)

## 2018-06-23 LAB — TROPONIN I: Troponin I: <0.03 ng/mL (ref <0.03)

## 2018-06-23 LAB — PREPARE RBC (CROSSMATCH)

## 2018-06-23 LAB — BRAIN NATRIURETIC PEPTIDE: B Natriuretic Peptide: 261.3 pg/mL — ABNORMAL HIGH (ref 0.0–100.0)

## 2018-06-23 LAB — CBG MONITORING, ED: Glucose-Capillary: 191 mg/dL — ABNORMAL HIGH (ref 70–99)

## 2018-06-23 MED ORDER — SODIUM CHLORIDE 0.9 % IV SOLN
10.0000 mL/h | Freq: Once | INTRAVENOUS | Status: AC
  Administered 2018-06-23: 22:00:00 10 mL/h via INTRAVENOUS

## 2018-06-23 MED ORDER — PANTOPRAZOLE SODIUM 40 MG IV SOLR
40.0000 mg | Freq: Once | INTRAVENOUS | Status: AC
  Administered 2018-06-23: 22:00:00 40 mg via INTRAVENOUS
  Filled 2018-06-23: qty 40

## 2018-06-23 NOTE — ED Notes (Signed)
Critical hemoglobin 4.0.Marland Kitchen. md aware

## 2018-06-23 NOTE — Progress Notes (Addendum)
Error. Please see filed H&P.

## 2018-06-23 NOTE — Progress Notes (Signed)
Arterial results given to the emergency room physician. Results will not cross over into Epic due to hemoglobin results. Hemoglobin will be obtained from other labs drawn.  Ph=7.43,CO2=30,PO2=122,HCO3=19.7

## 2018-06-23 NOTE — ED Notes (Signed)
ED TO INPATIENT HANDOFF REPORT  ED Nurse Name and Phone #:  Tanikka Bresnan [phone]  S Name/Age/Gender Leonard [name] 68 y.o. male Room/Bed: 022C/022C  Code Status   Code Status: Prior  Home/SNF/Other Nursing Home Patient oriented to: self, place, time and situation Is this baseline? Yes   Triage Complete: Triage complete  Chief Complaint gibleed  Triage Note Per nursing facility patient had 2 episodes of black tarry output. 1 x emesis, 1x stool. Hstry of stomach cancer.Patient also more withdrawn than usual.   Allergies Allergies  Allergen Reactions  . Penicillins Other (See Comments)    Has patient had a PCN reaction causing immediate rash, facial/tongue/throat swelling, SOB or lightheadedness with hypotension: Unknown Has patient had a PCN reaction causing severe rash involving mucus membranes or skin necrosis: Unknown Has patient had a PCN reaction that required hospitalization: Unknown Has patient had a PCN reaction occurring within the last 10 years: Unknown If all of the above answers are "NO", then may proceed with Cephalosporin use.     Level of Care/Admitting Diagnosis ED Disposition    ED Disposition Condition [facility] Area: [place] [[id number]]  Level of Care: Telemetry Medical [104]  Covid Evaluation: Screening Protocol (No Symptoms)  Diagnosis: GI bleed [[id number]]  Admitting Physician: Benay Pike [[id number]]  Attending Physician: Martyn Malay [[id number]]  Estimated length of stay: 3 - 4 days  Certification:: I certify this patient will need inpatient services for at least 2 midnights  PT Class (Do Not Modify): Inpatient [101]  PT Acc Code (Do Not Modify): Private [1]       B Medical/Surgery History Past Medical History:  Diagnosis Date  . Acute on chronic diastolic heart failure ([place]) 07/04/2014  . Anemia of renal disease   . Atherosclerotic peripheral vascular disease with gangrene ([place]) 02/14/2017  . CAD  (coronary artery disease) 04/06/2013  . Cancer ([place])    skin cancer-  . CHF (congestive heart failure) ([place])   . Chronic anemia 02/28/2016  . Chronic deep vein thrombosis (DVT) of femoral vein of left lower extremity ([place]) 09/03/2016  . Chronic respiratory failure with hypoxia ([place]) 08/25/2017  . CKD (chronic kidney disease) stage 4, GFR 15-29 ml/min (HCC) 01/15/2015   Follows with [facility].  Last visits:  07/23/16: No change in meds. Worried that if we increase diuretics could lead to another AKI  . CKD (chronic kidney disease), stage IV ([place])    07/07/2017  . Claudication ([place])   . Collapse of left talus 01/14/2017  . COPD GOLD III  02/16/2014   Quit smoking 2014  - PFT's  04/06/13   FEV1 1.28 (44 % ) ratio 48  p no % improvement from saba p ?  prior to study with DLCO  43/54 % corrects to 97  % for alv volume - 01/31/2016  After extensive coaching HFA effectiveness =    75% from baseline 0 > changed to sprivia respimat   - PFT's  03/17/2016  FEV1 2.25  (81 % ) ratio 88  p 2 % improvement from saba p spiriva respimat x 2  prior to study with  . Coronary artery disease   . Diabetes ([place])   . Diabetic polyneuropathy associated with type 2 diabetes mellitus ([place]) 01/14/2017  . Diabetic retinopathy ([place]) 11/13/2015   Per Dr. Erenest Rasher eye exam in 10/17/15, mild background diabetic retinopathy  . Fatigue associated with anemia 01/21/2016  . Gangrene of toe ([place])  right 5th/notes 01/04/2013   . HCAP (healthcare-associated pneumonia)   . Hematuria 06/27/2015  . High cholesterol   . History of tobacco use 08/23/2013   Smoked pack per day for 46 years Quit smoking - 02/03/2013   . Hypertension   . Hypothyroidism 11/22/2014  . Idiopathic chronic gout of left foot without tophus   . Iron deficiency anemia 08/03/2015  . Left foot pain 10/29/2016  . Long term (current) use of anticoagulants [Z79.01] 09/09/2016  . 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 Mixed hyperlipidemia  07/17/2008  . Morbid obesity due to excess calories ([place]) 03/17/2016  . PAD (peripheral artery disease) ([place])   . Pleural effusion   . Pneumonitis 07/26/2017  . Pulmonary artery hypertension ([place]) 07/25/2017  . PVD - hx of Rt SFA PTA and s/p Rt 4-5th toe amp Dec 2014 04/05/2013  . 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 Respiratory failure ([place]) 08/13/2017  . Respiratory failure with hypoxia ([place]) 09/23/2017  . 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 Secondary hypertension   . Shortness of breath dyspnea    with exertion  . Skin lesion of face 02/22/2015  . SOB (shortness of breath)   . Type 2 diabetes mellitus with pressure callus ([place]) 12/29/2016  . Type II diabetes mellitus (HCC)    Type II  . Type II diabetes mellitus with complication [facility])    Past Surgical History:  Procedure Laterality Date  . ABDOMINAL AORTOGRAM W/LOWER EXTREMITY N/A 03/12/2017   Procedure: ABDOMINAL AORTOGRAM W/LOWER EXTREMITY;  Surgeon: Conrad Tuscola, MD;  Location: [facility];  Service: Cardiovascular;  Laterality: N/A;  . AMPUTATION Right 01/28/2013   Procedure: RAY AMPUTATION RIGHT  4th & 5th TOE;  Surgeon: Rosetta Posner, MD;  Location: [facility];  Service: Vascular;  Laterality: Right;  . AMPUTATION Left 03/15/2017   Procedure: AMPUTATION ABOVE KNEE;  Surgeon: Waynetta Sandy, MD;  Location: [facility];  Service: Vascular;  Laterality: Left;  . ANGIOPLASTY / STENTING FEMORAL Right 01/13/2013   superficial femoral artery x 2 (3 mm x 60 mm, 4 mm x 60 mm)  . AV FISTULA PLACEMENT Left 02/11/2016   Procedure: LEFT UPPER ARM BRACHIAL CEPHALIC ARTERIOVENOUS (AV) FISTULA CREATION;  Surgeon: Conrad [place], MD;  Location: [facility];  Service: Vascular;  Laterality: Left;  . BIOPSY  03/29/2018   Procedure: BIOPSY;  Surgeon: Rush Landmark Telford Nab., MD;  Location: [facility];  Service: Gastroenterology;;  . BIOPSY  05/31/2018   Procedure: BIOPSY;  Surgeon: Irving Copas., MD;  Location: [facility];  Service: Gastroenterology;;   . COLONOSCOPY W/ POLYPECTOMY    . CORONARY ARTERY BYPASS GRAFT N/A 04/07/2013   Procedure: CORONARY ARTERY BYPASS GRAFTING (CABG);  Surgeon: Ivin Poot, MD;  Location: [facility];  Service: Open Heart Surgery;  Laterality: N/A;  . ESOPHAGOGASTRODUODENOSCOPY (EGD) WITH PROPOFOL N/A 03/29/2018   Procedure: ESOPHAGOGASTRODUODENOSCOPY (EGD) WITH PROPOFOL;  Surgeon: Rush Landmark Telford Nab., MD;  Location: [facility];  Service: Gastroenterology;  Laterality: N/A;  . ESOPHAGOGASTRODUODENOSCOPY (EGD) WITH PROPOFOL N/A 05/31/2018   Procedure: ESOPHAGOGASTRODUODENOSCOPY (EGD) WITH PROPOFOL;  Surgeon: Rush Landmark Telford Nab., MD;  Location: [name];  Service: Gastroenterology;  Laterality: N/A;  . EUS N/A 05/31/2018   Procedure: UPPER ENDOSCOPIC ULTRASOUND (EUS) RADIAL;  Surgeon: Rush Landmark Telford Nab., MD;  Location: [facility];  Service: Gastroenterology;  Laterality: N/A;  . INTRAOPERATIVE TRANSESOPHAGEAL ECHOCARDIOGRAM N/A 04/07/2013   Procedure: INTRAOPERATIVE TRANSESOPHAGEAL ECHOCARDIOGRAM;  Surgeon: Ivin Poot, MD;  Location: [facility];  Service:  Open Heart Surgery;  Laterality: N/A;  . IR FLUORO GUIDE CV LINE RIGHT  11/04/2017  . IR REMOVAL TUN CV CATH W/O FL  11/27/2017  . IR US GUIDE VASC ACCESS RIGHT  11/04/2017  . LEFT HEART CATHETERIZATION WITH CORONARY ANGIOGRAM N/A 04/05/2013   Procedure: LEFT HEART CATHETERIZATION WITH CORONARY ANGIOGRAM;  Surgeon: Peter M Martinique, MD;  Location: [facility] C[facility] LAB;  Service: Cardiovascular;  Laterality: N/A;  . LOWER EXTREMITY ANGIOGRAM Bilateral 01/06/2013   Procedure: LOWER EXTREMITY ANGIOGRAM;  Surgeon: Conrad Weeki [place], MD;  Location: [facility] CATH LAB;  Service: Cardiovascular;  Laterality: Bilateral;  . LOWER EXTREMITY ANGIOGRAM Right 01/13/2013   Procedure: LOWER EXTREMITY ANGIOGRAM;  Surgeon: Conrad [place], MD;  Location: [facility] C[facility] LAB;  Service: Cardiovascular;  Laterality: Right;  . SKIN CANCER EXCISION  2017  . THROAT SURGERY  1983   polyps  . TONSILLECTOMY AND  ADENOIDECTOMY  ~ 1965     A IV Location/Drains/Wounds Patient Lines/Drains/Airways Status   Active Line/Drains/Airways    Name:   Placement date:   Placement time:   Site:   Days:   Peripheral IV 06/23/18 Right Antecubital   06/23/18    2056    Antecubital   less than 1   Fistula / Graft Left Upper arm Arteriovenous vein graft   07/03/17    0000    Upper arm   355   Tunneled CVC Single Lumen (Radiology) 11/04/17 Right Internal jugular 24 cm   11/04/17    1009    Internal jugular   231          Intake/Output Last 24 hours No intake or output data in the 24 hours ending 06/23/18 2336  Labs/Imaging Results for orders placed or performed during the hospital encounter of 06/23/18 (from the past 48 hour(s))  Comprehensive metabolic panel     Status: Abnormal   Collection Time: 06/23/18  8:39 PM  Result Value Ref Range   Sodium 139 135 - 145 mmol/L   Potassium 4.5 3.5 - 5.1 mmol/L   Chloride 109 98 - 111 mmol/L   CO2 20 (L) 22 - 32 mmol/L   Glucose, Bld 234 (H) 70 - 99 mg/dL   BUN 74 (H) 8 - 23 mg/dL   Creatinine, Ser 3.50 (H) 0.61 - 1.24 mg/dL   Calcium 8.1 (L) 8.9 - 10.3 mg/dL   Total Protein 5.2 (L) 6.5 - 8.1 g/dL   Albumin 2.7 (L) 3.5 - 5.0 g/dL   AST 10 (L) 15 - 41 U/L   ALT 8 0 - 44 U/L   Alkaline Phosphatase 96 38 - 126 U/L   Total Bilirubin 0.5 0.3 - 1.2 mg/dL   GFR calc non Af Amer 17 (L) >60 mL/min   GFR calc Af Amer 20 (L) >60 mL/min   Anion gap 10 5 - 15    Comment: Performed at [facility], [address] [place], [place] [address]  Brain natriuretic peptide     Status: Abnormal   Collection Time: 06/23/18  8:39 PM  Result Value Ref Range   B Natriuretic Peptide 261.3 (H) 0.0 - 100.0 pg/mL    Comment: Performed at [facility] [address] [place], [place] [address]  Troponin I - Once     Status: None   Collection Time: 06/23/18  8:39 PM  Result Value Ref Range   Troponin I <0.03 <0.03 ng/mL    Comment: Performed at [facility]  [address] [place], [place] [address]  CBC with Differential     Status: Abnormal   Collection Time: 06/23/18  8:39 PM  Result Value Ref Range   WBC 7.1 4.0 - 10.5 K/uL   RBC 1.37 (L) 4.22 - 5.81 MIL/uL   Hemoglobin 4.0 (LL) 13.0 - 17.0 g/dL    Comment: REPEATED TO VERIFY THIS CRITICAL RESULT HAS VERIFIED AND BEEN CALLED TO Shemaiah Round,RN BY TORI FOWLER ON 05 20 2020 AT 2117, AND HAS BEEN READ BACK.     HCT 13.2 (L) 39.0 - 52.0 %   MCV 96.4 80.0 - 100.0 fL   MCH 29.2 26.0 - 34.0 pg   MCHC 30.3 30.0 - 36.0 g/dL   RDW 16.7 (H) 11.5 - 15.5 %   Platelets 124 (L) 150 - 400 K/uL   nRBC 0.0 0.0 - 0.2 %   Neutrophils Relative % 77 %   Neutro Abs 5.5 1.7 - 7.7 K/uL   Lymphocytes Relative 11 %   Lymphs Abs 0.8 0.7 - 4.0 K/uL   Monocytes Relative 7 %   Monocytes Absolute 0.5 0.1 - 1.0 K/uL   Eosinophils Relative 4 %   Eosinophils Absolute 0.3 0.0 - 0.5 K/uL   Basophils Relative 0 %   Basophils Absolute 0.0 0.0 - 0.1 K/uL   Immature Granulocytes 1 %   Abs Immature Granulocytes 0.06 0.00 - 0.07 K/uL    Comment: Performed at [facility] [address] [place], [place] [address]  Protime-INR     Status: None   Collection Time: 06/23/18  8:39 PM  Result Value Ref Range   Prothrombin Time 14.5 11.4 - 15.2 seconds   INR 1.1 0.8 - 1.2    Comment: (NOTE) INR goal varies based on device and disease states. Performed at [facility], [place] [address] [place], [place] [address]   Type and screen [place]     Status: None (Preliminary result)   Collection Time: 06/23/18  8:45 PM  Result Value Ref Range   ABO/RH(D) B NEG    Antibody Screen NEG    Sample Expiration      06/26/2018,2359 Performed at [facility], [place] [address], [place], [place] [address]    Unit Number [id number]    Blood Component Type RBC, LR IRR    Unit division 00    Status of Unit ALLOCATED    Transfusion Status OK TO TRANSFUSE    Crossmatch Result Compatible    Unit  Number [id number]    Blood Component Type RBC, LR IRR    Unit division 00    Status of Unit ALLOCATED    Transfusion Status OK TO TRANSFUSE    Crossmatch Result Compatible   CBG monitoring, ED     Status: Abnormal   Collection Time: 06/23/18  8:59 PM  Result Value Ref Range   Glucose-Capillary 191 (H) 70 - 99 mg/dL  Prepare RBC     Status: None   Collection Time: 06/23/18  9:20 PM  Result Value Ref Range   Order Confirmation      ORDER PROCESSED BY BLOOD BANK Performed at [facility], [place] [address], [name], [place] [address]   SARS Coronavirus 2 (CEPHEID - Performed in [facility] hospital lab), Hosp Order     Status: None   Collection Time: 06/23/18 10:08 PM  Result Value Ref Range   SARS Coronavirus 2 NEGATIVE NEGATIVE    Comment: (NOTE) If result is  NEGATIVE SARS-CoV-2 target nucleic acids are NOT DETECTED. The SARS-CoV-2 RNA is generally detectable in upper and lower  respiratory specimens during the acute phase of infection. The lowest  concentration of SARS-CoV-2 viral copies this assay can detect is 250  copies / mL. A negative result does not preclude SARS-CoV-2 infection  and should not be used as the sole basis for treatment or other  patient management decisions.  A negative result may occur with  improper specimen collection / handling, submission of specimen other  than nasopharyngeal swab, presence of viral mutation(s) within the  areas targeted by this assay, and inadequate number of viral copies  (<250 copies / mL). A negative result must be combined with clinical  observations, patient history, and epidemiological information. If result is POSITIVE SARS-CoV-2 target nucleic acids are DETECTED. The SARS-CoV-2 RNA is generally detectable in upper and lower  respiratory specimens dur ing the acute phase of infection.  Positive  results are indicative of active infection with SARS-CoV-2.  Clinical  correlation with patient history and other diagnostic  information is  necessary to determine patient infection status.  Positive results do  not rule out bacterial infection or co-infection with other viruses. If result is PRESUMPTIVE POSTIVE SARS-CoV-2 nucleic acids MAY BE PRESENT.   A presumptive positive result was obtained on the submitted specimen  and confirmed on repeat testing.  While 2019 novel coronavirus  (SARS-CoV-2) nucleic acids may be present in the submitted sample  additional confirmatory testing may be necessary for epidemiological  and / or clinical management purposes  to differentiate between  SARS-CoV-2 and other Sarbecovirus currently known to infect humans.  If clinically indicated additional testing with an alternate test  methodology [phone]) is advised. The SARS-CoV-2 RNA is generally  detectable in upper and lower respiratory sp ecimens during the acute  phase of infection. The expected result is Negative. Fact Sheet for Patients:  StrictlyIdeas.no Fact Sheet for Healthcare Providers: BankingDealers.co.za This test is not yet approved or cleared by the [place] FDA and has been authorized for detection and/or diagnosis of SARS-CoV-2 by FDA under an Emergency Use Authorization (EUA).  This EUA will remain in effect (meaning this test can be used) for the duration of the COVID-19 declaration under Section 564(b)(1) of the Act, 21 U.S.C. section 360bbb-3(b)(1), unless the authorization is terminated or revoked sooner. Performed at [facility], [place] [address], [place], [place] [address]    *Note: Due to a large number of results and/or encounters for the requested time period, some results have not been displayed. A complete set of results can be found in Results Review.   Dg Chest Portable 1 View  Result Date: 06/23/2018 CLINICAL DATA:  Weakness vomiting EXAM: PORTABLE CHEST 1 VIEW COMPARISON:  03/28/2018, PET-CT 04/21/2018 FINDINGS: Possible hazy  infiltrate at the left base. Mild cardiomegaly. No pleural effusion or pneumothorax. IMPRESSION: Cardiomegaly.  Possible mild hazy infiltrate at the left base. Electronically Signed   By: Donavan Foil M.D.   On: 06/23/2018 21:27    Pending Labs Unresulted Labs (From admission, onward)    Start     Ordered   06/23/18 2103  Urinalysis, Routine w reflex microscopic  ONCE - STAT,   STAT     06/23/18 2102   Signed and Held  Comprehensive metabolic panel  Tomorrow morning,   R     Signed and Held   Signed and Held  CBC  Tomorrow morning,   R     Signed  and Held   Signed and Held  Protime-INR  Tomorrow morning,   R     Signed and Held   Signed and Held  APTT  Tomorrow morning,   R     Signed and Held          Vitals/Pain Today's Vitals   06/23/18 2230 06/23/18 2245 06/23/18 2300 06/23/18 2315  BP: (!) 146/53 (!) 127/50 (!) 134/53 (!) 128/55  Pulse: 73 70 70 70  Resp: 12 14 12 12   Temp:      TempSrc:      SpO2: 100% 100% 100% 100%  PainSc:        Isolation Precautions No active isolations  Medications Medications  pantoprazole (PROTONIX) injection 40 mg (40 mg Intravenous Given 06/23/18 2207)  0.9 %  sodium chloride infusion (10 mL/hr Intravenous New Bag/Given 06/23/18 2204)    Mobility non-ambulatory Low fall risk   Focused Assessments Cardiac Assessment Handoff:  Cardiac Rhythm: Normal sinus rhythm Lab Results  Component Value Date   TROPONINI <0.03 06/23/2018   No results found for: DDIMER Does the Patient currently have chest pain? No     R Recommendations: See Admitting Provider Note  Report given to:   Additional Notes:  Blood Bank has called and said blood is ready.

## 2018-06-23 NOTE — ED Provider Notes (Signed)
[facility] EMERGENCY DEPARTMENT Provider Note   CSN: [id number] Arrival date & time: 06/23/18  2015  LEVEL 5 CAVEAT - ALTERED MENTAL STATUS/VERY HARD OF HEARING  History   Chief Complaint Chief Complaint  Patient presents with  . Melena    HPI Leonard [name] is a 68 y.o. male.     HPI  68 year old male with a history of gastric cancer and recurrent GI bleeds presents with recurrent bleeding and lethargy.  The patient comes from a nursing facility where he reportedly vomited dark and may be black emesis and then had black bowel movement.  Patient has been more lethargic than typical.  I talked to niece over the phone who notes that this typically occurs when his hemoglobin is low.  History from the patient himself is very difficult because of severe difficulty hearing, otherwise he answers no when asked if he is having pain or dizziness.  Past Medical History:  Diagnosis Date  . Acute on chronic diastolic heart failure ([place]) 07/04/2014  . Anemia of renal disease   . Atherosclerotic peripheral vascular disease with gangrene ([place]) 02/14/2017  . CAD (coronary artery disease) 04/06/2013  . Cancer ([place])    skin cancer-  . CHF (congestive heart failure) ([place])   . Chronic anemia 02/28/2016  . Chronic deep vein thrombosis (DVT) of femoral vein of left lower extremity ([place]) 09/03/2016  . Chronic respiratory failure with hypoxia ([place]) 08/25/2017  . CKD (chronic kidney disease) stage 4, GFR 15-29 ml/min (HCC) 01/15/2015   Follows with [facility].  Last visits:  07/23/16: No change in meds. Worried that if we increase diuretics could lead to another AKI  . CKD (chronic kidney disease), stage IV ([place])    07/07/2017  . Claudication ([place])   . Collapse of left talus 01/14/2017  . COPD GOLD III  02/16/2014   Quit smoking 2014  - PFT's  04/06/13   FEV1 1.28 (44 % ) ratio 48  p no % improvement from saba p ?  prior to study with DLCO  43/54 % corrects to 97  % for alv volume -  01/31/2016  After extensive coaching HFA effectiveness =    75% from baseline 0 > changed to sprivia respimat   - PFT's  03/17/2016  FEV1 2.25  (81 % ) ratio 88  p 2 % improvement from saba p spiriva respimat x 2  prior to study with  . Coronary artery disease   . Diabetes ([place])   . Diabetic polyneuropathy associated with type 2 diabetes mellitus ([place]) 01/14/2017  . Diabetic retinopathy ([place]) 11/13/2015   Per Dr. Erenest Rasher eye exam in 10/17/15, mild background diabetic retinopathy  . Fatigue associated with anemia 01/21/2016  . Gangrene of toe ([place])    right 5th/notes 01/04/2013   . HCAP (healthcare-associated pneumonia)   . Hematuria 06/27/2015  . High cholesterol   . History of tobacco use 08/23/2013   Smoked pack per day for 46 years Quit smoking - 02/03/2013   . Hypertension   . Hypothyroidism 11/22/2014  . Idiopathic chronic gout of left foot without tophus   . Iron deficiency anemia 08/03/2015  . Left foot pain 10/29/2016  . Long term (current) use of anticoagulants [Z79.01] 09/09/2016  . 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 Mixed hyperlipidemia 07/17/2008  . Morbid obesity due to excess calories ([place]) 03/17/2016  . PAD (peripheral artery disease) ([place])   . Pleural effusion   . Pneumonitis 07/26/2017  . Pulmonary  artery hypertension ([place]) 07/25/2017  . PVD - hx of Rt SFA PTA and s/p Rt 4-5th toe amp Dec 2014 04/05/2013  . 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 Respiratory failure ([place]) 08/13/2017  . Respiratory failure with hypoxia ([place]) 09/23/2017  . 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 Secondary hypertension   . Shortness of breath dyspnea    with exertion  . Skin lesion of face 02/22/2015  . SOB (shortness of breath)   . Type 2 diabetes mellitus with pressure callus ([place]) 12/29/2016  . Type II diabetes mellitus (HCC)    Type II  . Type II diabetes mellitus with complication [facility])     Patient Active Problem List   Diagnosis Date Noted  . Acute renal failure superimposed  on chronic kidney disease ([place])   . Abdominal pain   . Adenocarcinoma ([place])   . DNR (do not resuscitate)   . 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 Palliative care encounter   . Symptomatic anemia 03/29/2018  . Acute GI bleeding 03/28/2018  . Dysphagia 10/01/2017  . Palliative care by specialist   . Severe comorbid illness   . Moderate malnutrition ([place]) 08/27/2017  . Hypoxemia   . Atelectasis   . Chronic respiratory failure with hypoxia ([place]) 08/25/2017  . Pulmonary artery hypertension ([place]) 07/25/2017  . Pressure injury of skin 07/20/2017  . Goals of care, counseling/discussion 02/11/2017  . Diabetic polyneuropathy associated with type 2 diabetes mellitus ([place]) 01/14/2017  . Pleural effusion   . Chronic anemia 02/28/2016  . Diabetic retinopathy ([place]) 11/13/2015  . Iron deficiency anemia 08/03/2015  . Type II diabetes mellitus with complication ([place])   . Chronic kidney disease (CKD), stage III (moderate) (HCC)   . 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 Deficiency anemia 01/15/2015  . CKD (chronic kidney disease) stage 4, GFR 15-29 ml/min (HCC) 01/15/2015  . Hypothyroidism 11/22/2014  . COPD GOLD III  02/16/2014  . Anemia of renal disease   . Chronic diastolic CHF (congestive heart failure) ([place])   . History of tobacco use 08/23/2013  . 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 CAD (coronary artery disease) 04/06/2013  . PVD - hx of Rt SFA PTA and s/p Rt 4-5th toe amp Dec 2014 04/05/2013  . 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 Mixed hyperlipidemia 07/17/2008    Past Surgical History:  Procedure Laterality Date  . ABDOMINAL AORTOGRAM W/LOWER EXTREMITY N/A 03/12/2017   Procedure: ABDOMINAL AORTOGRAM W/LOWER EXTREMITY;  Surgeon: Conrad Warfield, MD;  Location: [facility] CV LAB;  Service: Cardiovascular;  Laterality: N/A;  . AMPUTATION Right 01/28/2013   Procedure: RAY AMPUTATION RIGHT  4th & 5th TOE;  Surgeon: Rosetta Posner, MD;  Location: [facility];  Service:  Vascular;  Laterality: Right;  . AMPUTATION Left 03/15/2017   Procedure: AMPUTATION ABOVE KNEE;  Surgeon: Waynetta Sandy, MD;  Location: [name];  Service: Vascular;  Laterality: Left;  . ANGIOPLASTY / STENTING FEMORAL Right 01/13/2013   superficial femoral artery x 2 (3 mm x 60 mm, 4 mm x 60 mm)  . AV FISTULA PLACEMENT Left 02/11/2016   Procedure: LEFT UPPER ARM BRACHIAL CEPHALIC ARTERIOVENOUS (AV) FISTULA CREATION;  Surgeon: Conrad Baton Rouge, MD;  Location: [facility];  Service: Vascular;  Laterality: Left;  . BIOPSY  03/29/2018   Procedure: BIOPSY;  Surgeon: Rush Landmark Telford Nab., MD;  Location: [facility];  Service: Gastroenterology;;  . BIOPSY  05/31/2018   Procedure: BIOPSY;  Surgeon: Irving Copas., MD;  Location: [facility];  Service: Gastroenterology;;  . COLONOSCOPY W/ POLYPECTOMY    . CORONARY ARTERY BYPASS GRAFT N/A 04/07/2013   Procedure: CORONARY ARTERY BYPASS GRAFTING (CABG);  Surgeon: Ivin Poot, MD;  Location: [facility];  Service: Open Heart Surgery;  Laterality: N/A;  . ESOPHAGOGASTRODUODENOSCOPY (EGD) WITH PROPOFOL N/A 03/29/2018   Procedure: ESOPHAGOGASTRODUODENOSCOPY (EGD) WITH PROPOFOL;  Surgeon: Rush Landmark Telford Nab., MD;  Location: [facility];  Service: Gastroenterology;  Laterality: N/A;  . ESOPHAGOGASTRODUODENOSCOPY (EGD) WITH PROPOFOL N/A 05/31/2018   Procedure: ESOPHAGOGASTRODUODENOSCOPY (EGD) WITH PROPOFOL;  Surgeon: Rush Landmark Telford Nab., MD;  Location: [facility];  Service: Gastroenterology;  Laterality: N/A;  . EUS N/A 05/31/2018   Procedure: UPPER ENDOSCOPIC ULTRASOUND (EUS) RADIAL;  Surgeon: Rush Landmark Telford Nab., MD;  Location: [facility];  Service: Gastroenterology;  Laterality: N/A;  . INTRAOPERATIVE TRANSESOPHAGEAL ECHOCARDIOGRAM N/A 04/07/2013   Procedure: INTRAOPERATIVE TRANSESOPHAGEAL ECHOCARDIOGRAM;  Surgeon: Ivin Poot, MD;  Location: [facility];  Service: Open Heart Surgery;  Laterality: N/A;  . IR FLUORO GUIDE CV LINE RIGHT  11/04/2017   . IR REMOVAL TUN CV CATH W/O FL  11/27/2017  . IR US GUIDE VASC ACCESS RIGHT  11/04/2017  . LEFT HEART CATHETERIZATION WITH CORONARY ANGIOGRAM N/A 04/05/2013   Procedure: LEFT HEART CATHETERIZATION WITH CORONARY ANGIOGRAM;  Surgeon: Peter M Martinique, MD;  Location: [facility] CATH LAB;  Service: Cardiovascular;  Laterality: N/A;  . LOWER EXTREMITY ANGIOGRAM Bilateral 01/06/2013   Procedure: LOWER EXTREMITY ANGIOGRAM;  Surgeon: Conrad Ossipee, MD;  Location[facility] C[facility];  Service: Cardiovascular;  Laterality: Bilateral;  . LOWER EXTREMITY ANGIOGRAM Right 01/13/2013   Procedure: LOWER EXTREMITY ANGIOGRAM;  Surgeon: Conrad Willard, MD;  Location: [facility] CATH LAB;  Service: Cardiovascular;  Laterality: Right;  . SKIN CANCER EXCISION  2017  . THROAT SURGERY  1983   polyps  . TONSILLECTOMY AND ADENOIDECTOMY  ~ 1965        Home Medications    Prior to Admission medications   Medication Sig Start Date End Date Taking? Authorizing Provider  acetaminophen (TYLENOL) 325 MG tablet Take 650 mg by mouth every 8 (eight) hours as needed for mild pain or fever.     [provider]  amLODipine (NORVASC) 10 MG tablet Take 1 tablet (10 mg total) by mouth daily. 06/11/18   Larey Dresser, MD  aspirin EC 81 MG tablet Take 1 tablet (81 mg total) by mouth daily. 04/07/18 04/07/19  Daisy Floro, DO  atorvastatin (LIPITOR) 40 MG tablet TAKE ONE (1) TABLET BY MOUTH EVERY DAY AT 6:00PM Patient taking differently: Take 40 mg by mouth daily at 6 PM.  11/17/16   Larey Dresser, MD  Blood Glucose Monitoring Suppl (Day) w/Device KIT 1 kit by Does not apply route daily. 10/10/16   McDiarmid, Blane Ohara, MD  carvedilol (COREG) 12.5 MG tablet Take 1 tablet (12.5 mg total) by mouth 2 (two) times daily with a meal. 08/30/17   Meccariello, Bernita Raisin, DO  cholecalciferol (VITAMIN D) 25 MCG (1000 UT) tablet Take 2,000 Units by mouth 3 (three) times daily.    [provider]  cloNIDine (CATAPRES - DOSED IN MG/24  HR) 0.2 mg/24hr patch Place 1 patch (0.2 mg total) onto the skin once a week. Patient taking differently: Place 0.2 mg onto the skin every Tuesday.  08/09/17   Steve Rattler, DO  Darbepoetin Alfa (ARANESP) 40 MCG/0.4ML SOSY injection Inject 0.4 mLs (40 mcg total) into the skin every 30 (thirty) days. 09/28/17  Meccariello, Bernita Raisin, DO  doxazosin (CARDURA) 8 MG tablet Take 1 tablet (8 mg total) by mouth every evening. 12/01/16   Zenia Resides, MD  feeding supplement, ENSURE ENLIVE, (ENSURE ENLIVE) LIQD Take 237 mLs by mouth 3 (three) times daily between meals. 08/07/17   Steve Rattler, DO  ferrous sulfate (FEROSUL) 325 (65 FE) MG tablet Take 1 tablet (325 mg total) by mouth daily at 12 noon. Patient taking differently: Take 325 mg by mouth daily.  08/31/17   Meccariello, Bernita Raisin, DO  glucose blood (ONETOUCH VERIO) test strip Use as instructed to test three times daily. ICD-10 code: E11.22 10/15/16   McDiarmid, Blane Ohara, MD  hydrALAZINE (APRESOLINE) 100 MG tablet TAKE ONE TABLET BY MOUTH EVERY EIGHT HOURS Patient taking differently: Take 100 mg by mouth every 8 (eight) hours.  11/14/16   [name], MD  insulin aspart (NOVOLOG) 100 UNIT/ML injection Inject 0-10 Units into the skin See admin instructions. Three times daily per sliding scale: <150 = 0 units 151-200 = 2 units 201-250 = 4 units 251-300 = 6 units 301-350 = 8 units 351-400 = 10 units Call PCP if CBG <70 or >400    [provider]  ipratropium-albuterol (DUONEB) 0.5-2.5 (3) MG/3ML SOLN Take 3 mLs by nebulization every 6 (six) hours as needed (shortness of breath and wheezing).     [provider]  isosorbide mononitrate (IMDUR) 120 MG 24 hr tablet Take 1 tablet (120 mg total) by mouth daily. 10/02/17   Anderson, Chelsey L, DO  levothyroxine (SYNTHROID, LEVOTHROID) 100 MCG tablet Take 100 mcg by mouth daily before breakfast.     [provider]  [facility] MAGNAIR STARTER KIT 25 MCG/ML SOLN Inhale 1 mL  into the lungs 2 (two) times daily. 09/21/17   [provider]  nitroGLYCERIN (NITROSTAT) 0.4 MG SL tablet Place 1 tablet (0.4 mg total) under the tongue every 5 (five) minutes as needed for chest pain. 11/14/16   [name], MD  [facility] DELICA LANCETS 04U MISC 1 each by Does not apply route 3 (three) times daily. ICD-10 code: E11.22 10/15/16   McDiarmid, Blane Ohara, MD  pantoprazole (PROTONIX) 40 MG tablet Take 1 tablet (40 mg total) by mouth 2 (two) times daily. 04/03/18   Patriciaann Clan, DO  polyethylene glycol (MIRALAX / GLYCOLAX) packet Take 17 g by mouth daily as needed for mild constipation or moderate constipation. Patient taking differently: Take 17 g by mouth daily as needed for mild constipation.  08/07/17   Steve Rattler, DO  potassium chloride SA (K-DUR,KLOR-CON) 20 MEQ tablet Take 2 tablets (40 mEq total) by mouth daily. 02/16/18   Larey Dresser, MD  torsemide (DEMADEX) 20 MG tablet Take 2 tablets (40 mg total) by mouth daily. 02/16/18   Larey Dresser, MD    Family History Family History  Problem Relation Age of Onset  . Cancer Mother        lung cancer  . Hypertension Mother   . Cancer Sister        patient thinks it was uterine cancer  . Hypertension Sister   . Heart attack Sister   . Stroke Neg Hx     Social History Social History   Tobacco Use  . Smoking status: Former Smoker    Packs/day: 1.00    Years: 46.00    Pack years: 46.00    Types: Cigarettes    Start date: 02/04/1967    Last attempt to quit:  02/03/2013    Years since quitting: 5.3  . Smokeless tobacco: Never Used  Substance Use Topics  . Alcohol use: No    Alcohol/week: 0.0 standard drinks  . Drug use: No     Allergies   Penicillins   Review of Systems Review of Systems  Unable to perform ROS: Mental status change     Physical Exam Updated Vital Signs BP (!) 146/53   Pulse 73   Temp 97.8 F (36.6 C) (Rectal)   Resp 12   SpO2 100%   Physical Exam Vitals signs  and nursing note reviewed.  Constitutional:      Appearance: He is well-developed.  HENT:     Head: Normocephalic and atraumatic.     Right Ear: External ear normal.     Left Ear: External ear normal.     Nose: Nose normal.  Eyes:     General:        Right eye: No discharge.        Left eye: No discharge.  Neck:     Musculoskeletal: Neck supple.  Cardiovascular:     Rate and Rhythm: Normal rate and regular rhythm.     Heart sounds: Normal heart sounds.  Pulmonary:     Effort: Pulmonary effort is normal.     Breath sounds: Normal breath sounds.  Abdominal:     General: There is no distension.     Palpations: Abdomen is soft.     Tenderness: There is no abdominal tenderness.  Skin:    General: Skin is warm and dry.  Neurological:     Mental Status: He is lethargic.  Psychiatric:        Mood and Affect: Mood is not anxious.      ED Treatments / Results  Labs (all labs ordered are listed, but only abnormal results are displayed) Labs Reviewed  COMPREHENSIVE METABOLIC PANEL - Abnormal; Notable for the following components:      Result Value   CO2 20 (*)    Glucose, Bld 234 (*)    BUN 74 (*)    Creatinine, Ser 3.50 (*)    Calcium 8.1 (*)    Total Protein 5.2 (*)    Albumin 2.7 (*)    AST 10 (*)    GFR calc non Af Amer 17 (*)    GFR calc Af Amer 20 (*)    All other components within normal limits  BRAIN NATRIURETIC PEPTIDE - Abnormal; Notable for the following components:   B Natriuretic Peptide 261.3 (*)    All other components within normal limits  CBC WITH DIFFERENTIAL/PLATELET - Abnormal; Notable for the following components:   RBC 1.37 (*)    Hemoglobin 4.0 (*)    HCT 13.2 (*)    RDW 16.7 (*)    Platelets 124 (*)    All other components within normal limits  CBG MONITORING, ED - Abnormal; Notable for the following components:   Glucose-Capillary 191 (*)    All other components within normal limits  SARS CORONAVIRUS 2 (HOSPITAL ORDER, [facility])  TROPONIN I  PROTIME-INR  URINALYSIS, ROUTINE W REFLEX MICROSCOPIC  I-STAT ARTERIAL BLOOD GAS, ED  POC OCCULT BLOOD, ED  TYPE AND SCREEN  PREPARE RBC (CROSSMATCH)    EKG EKG Interpretation  Date/Time:  Wednesday Jun 23 2018 20:58:12 EDT Ventricular Rate:  75 PR Interval:    QRS Duration: 100 QT Interval:  419 QTC Calculation: 468 R Axis:   55 Text  Interpretation:  Sinus rhythm Probable left ventricular hypertrophy Nonspecific T abnormalities, lateral leads Confirmed by Sherwood Gambler [phone]) on 06/23/2018 9:01:58 PM   Radiology Dg Chest Portable 1 View  Result Date: 06/23/2018 CLINICAL DATA:  Weakness vomiting EXAM: PORTABLE CHEST 1 VIEW COMPARISON:  03/28/2018, PET-CT 04/21/2018 FINDINGS: Possible hazy infiltrate at the left base. Mild cardiomegaly. No pleural effusion or pneumothorax. IMPRESSION: Cardiomegaly.  Possible mild hazy infiltrate at the left base. Electronically Signed   By: Donavan Foil M.D.   On: 06/23/2018 21:27    Procedures .Critical Care Performed by: Sherwood Gambler, MD Authorized by: Sherwood Gambler, MD   Critical care provider statement:    Critical care time (minutes):  35   Critical care time was exclusive of:  Separately billable procedures and treating other patients   Critical care was necessary to treat or prevent imminent or life-threatening deterioration of the following conditions:  Shock   Critical care was time spent personally by me on the following activities:  Discussions with consultants, evaluation of patient's response to treatment, examination of patient, ordering and performing treatments and interventions, ordering and review of laboratory studies, ordering and review of radiographic studies, pulse oximetry, re-evaluation of patient's condition, obtaining history from patient or surrogate and review of old charts   (including critical care time)  Medications Ordered in ED Medications  pantoprazole (PROTONIX)  injection 40 mg (40 mg Intravenous Given 06/23/18 2207)  0.9 %  sodium chloride infusion (10 mL/hr Intravenous New Bag/Given 06/23/18 2204)     Initial Impression / Assessment and Plan / ED Course  I have reviewed the triage vital signs and the nursing notes.  Pertinent labs & imaging results that were available during my care of the patient were reviewed by me and considered in my medical decision making (see chart for details).        Patient is sleeping and hard to get a good history from but he does wake up to light stimulation.  Vital signs are stable.  With his severe anemia, this probably causing increased fatigue/lethargy.  Blood gas shows pH of 7.4 with no hypercarbia.  He does not appear to need intubation.  He will be given 2 units of blood IV and he will need recheck and close monitoring.  I discussed with niece, Leavy Heatherly, who indicates patient would like a blood transfusion and is full code.  Family practice to admit.  Final Clinical Impressions(s) / ED Diagnoses   Final diagnoses:  Acute GI bleeding  Symptomatic anemia    ED Discharge Orders    None       Sherwood Gambler, MD 06/23/18 2253

## 2018-06-23 NOTE — Progress Notes (Signed)
Arterial blood gas obtained on room air.  

## 2018-06-23 NOTE — ED Triage Notes (Signed)
Per nursing facility patient had 2 episodes of black tarry output. 1 x emesis, 1x stool. Hstry of stomach cancer.Patient also more withdrawn than usual.

## 2018-06-24 ENCOUNTER — Encounter: Payer: Self-pay | Admitting: Cardiology

## 2018-06-24 ENCOUNTER — Telehealth: Payer: Self-pay | Admitting: Radiation Oncology

## 2018-06-24 DIAGNOSIS — E039 Hypothyroidism, unspecified: Secondary | ICD-10-CM

## 2018-06-24 DIAGNOSIS — K922 Gastrointestinal hemorrhage, unspecified: Secondary | ICD-10-CM

## 2018-06-24 DIAGNOSIS — D649 Anemia, unspecified: Secondary | ICD-10-CM

## 2018-06-24 DIAGNOSIS — I5032 Chronic diastolic (congestive) heart failure: Secondary | ICD-10-CM

## 2018-06-24 DIAGNOSIS — C169 Malignant neoplasm of stomach, unspecified: Principal | ICD-10-CM

## 2018-06-24 DIAGNOSIS — N184 Chronic kidney disease, stage 4 (severe): Secondary | ICD-10-CM

## 2018-06-24 DIAGNOSIS — K254 Chronic or unspecified gastric ulcer with hemorrhage: Secondary | ICD-10-CM

## 2018-06-24 LAB — URINALYSIS, ROUTINE W REFLEX MICROSCOPIC
Bilirubin Urine: NEGATIVE
Glucose, UA: NEGATIVE mg/dL
Hgb urine dipstick: NEGATIVE
Ketones, ur: NEGATIVE mg/dL
Nitrite: NEGATIVE
Protein, ur: 100 mg/dL — AB
Specific Gravity, Urine: 1.012 (ref 1.005–1.030)
WBC, UA: >50 WBC/hpf — ABNORMAL HIGH (ref 0–5)
pH: 7 (ref 5.0–8.0)

## 2018-06-24 LAB — GLUCOSE, CAPILLARY
Glucose-Capillary: 209 mg/dL — ABNORMAL HIGH (ref 70–99)
Glucose-Capillary: 190 mg/dL — ABNORMAL HIGH (ref 70–99)
Glucose-Capillary: 144 mg/dL — ABNORMAL HIGH (ref 70–99)
Glucose-Capillary: 177 mg/dL — ABNORMAL HIGH (ref 70–99)

## 2018-06-24 LAB — COMPREHENSIVE METABOLIC PANEL
ALT: 8 U/L (ref 0–44)
AST: 9 U/L — ABNORMAL LOW (ref 15–41)
Albumin: 2.7 g/dL — ABNORMAL LOW (ref 3.5–5.0)
Alkaline Phosphatase: 93 U/L (ref 38–126)
Anion gap: 11 (ref 5–15)
BUN: 67 mg/dL — ABNORMAL HIGH (ref 8–23)
CO2: 20 mmol/L — ABNORMAL LOW (ref 22–32)
Calcium: 8.3 mg/dL — ABNORMAL LOW (ref 8.9–10.3)
Chloride: 111 mmol/L (ref 98–111)
Creatinine, Ser: 3.4 mg/dL — ABNORMAL HIGH (ref 0.61–1.24)
GFR calc Af Amer: 20 mL/min — ABNORMAL LOW (ref >60)
GFR calc non Af Amer: 18 mL/min — ABNORMAL LOW (ref >60)
Glucose, Bld: 206 mg/dL — ABNORMAL HIGH (ref 70–99)
Potassium: 4 mmol/L (ref 3.5–5.1)
Sodium: 142 mmol/L (ref 135–145)
Total Bilirubin: 0.8 mg/dL (ref 0.3–1.2)
Total Protein: 5.2 g/dL — ABNORMAL LOW (ref 6.5–8.1)

## 2018-06-24 LAB — CBC
HCT: 19 % — ABNORMAL LOW (ref 39.0–52.0)
Hemoglobin: 6.1 g/dL — CL (ref 13.0–17.0)
MCH: 29.2 pg (ref 26.0–34.0)
MCHC: 32.1 g/dL (ref 30.0–36.0)
MCV: 90.9 fL (ref 80.0–100.0)
Platelets: 122 10*3/uL — ABNORMAL LOW (ref 150–400)
RBC: 2.09 MIL/uL — ABNORMAL LOW (ref 4.22–5.81)
RDW: 16 % — ABNORMAL HIGH (ref 11.5–15.5)
WBC: 6.3 10*3/uL (ref 4.0–10.5)
nRBC: 0 % (ref 0.0–0.2)

## 2018-06-24 LAB — APTT: aPTT: 31 seconds (ref 24–36)

## 2018-06-24 LAB — HEMOGLOBIN A1C
Hgb A1c MFr Bld: 6.5 % — ABNORMAL HIGH (ref 4.8–5.6)
Mean Plasma Glucose: 139.85 mg/dL

## 2018-06-24 LAB — HEMOGLOBIN AND HEMATOCRIT, BLOOD
HCT: 21.4 % — ABNORMAL LOW (ref 39.0–52.0)
Hemoglobin: 7.1 g/dL — ABNORMAL LOW (ref 13.0–17.0)

## 2018-06-24 LAB — PROTIME-INR
INR: 1.1 (ref 0.8–1.2)
Prothrombin Time: 14.3 seconds (ref 11.4–15.2)

## 2018-06-24 LAB — MRSA PCR SCREENING: MRSA by PCR: NEGATIVE

## 2018-06-24 MED ORDER — LEVOTHYROXINE SODIUM 100 MCG PO TABS
100.0000 ug | ORAL_TABLET | Freq: Every day | ORAL | Status: DC
  Administered 2018-06-24 – 2018-06-27 (×4): 100 ug via ORAL
  Filled 2018-06-24 (×4): qty 1

## 2018-06-24 MED ORDER — CARVEDILOL 12.5 MG PO TABS
12.5000 mg | ORAL_TABLET | Freq: Two times a day (BID) | ORAL | Status: DC
  Administered 2018-06-24 – 2018-06-27 (×7): 12.5 mg via ORAL
  Filled 2018-06-24 (×8): qty 1

## 2018-06-24 MED ORDER — SODIUM CHLORIDE 0.9% FLUSH
3.0000 mL | Freq: Two times a day (BID) | INTRAVENOUS | Status: DC
  Administered 2018-06-24: 3 mL via INTRAVENOUS
  Administered 2018-06-25: 10 mL via INTRAVENOUS
  Administered 2018-06-25 – 2018-06-27 (×4): 3 mL via INTRAVENOUS

## 2018-06-24 MED ORDER — ONDANSETRON HCL 4 MG PO TABS: 4.0000 mg | ORAL_TABLET | Freq: Four times a day (QID) | ORAL | Status: DC | PRN

## 2018-06-24 MED ORDER — SODIUM CHLORIDE 0.9% FLUSH: 3.0000 mL | INTRAVENOUS | Status: DC | PRN

## 2018-06-24 MED ORDER — ISOSORBIDE MONONITRATE ER 60 MG PO TB24
120.0000 mg | ORAL_TABLET | Freq: Every day | ORAL | Status: DC
  Administered 2018-06-24 – 2018-06-27 (×4): 120 mg via ORAL
  Filled 2018-06-24 (×4): qty 2

## 2018-06-24 MED ORDER — IPRATROPIUM-ALBUTEROL 0.5-2.5 (3) MG/3ML IN SOLN: 3.0000 mL | Freq: Four times a day (QID) | RESPIRATORY_TRACT | Status: DC | PRN

## 2018-06-24 MED ORDER — DOXAZOSIN MESYLATE 2 MG PO TABS: 8.0000 mg | ORAL_TABLET | Freq: Every evening | ORAL | Status: DC

## 2018-06-24 MED ORDER — HYDRALAZINE HCL 25 MG PO TABS
100.0000 mg | ORAL_TABLET | Freq: Three times a day (TID) | ORAL | Status: DC
  Administered 2018-06-24 – 2018-06-27 (×11): 100 mg via ORAL
  Filled 2018-06-24 (×11): qty 4

## 2018-06-24 MED ORDER — FERROUS SULFATE 325 (65 FE) MG PO TABS
325.0000 mg | ORAL_TABLET | Freq: Every day | ORAL | Status: DC
  Administered 2018-06-24 – 2018-06-27 (×4): 325 mg via ORAL
  Filled 2018-06-24 (×4): qty 1

## 2018-06-24 MED ORDER — TORSEMIDE 20 MG PO TABS
40.0000 mg | ORAL_TABLET | Freq: Every day | ORAL | Status: DC
  Administered 2018-06-24 – 2018-06-25 (×2): 40 mg via ORAL
  Filled 2018-06-24 (×2): qty 2

## 2018-06-24 MED ORDER — INSULIN ASPART 100 UNIT/ML ~~LOC~~ SOLN
0.0000 [IU] | Freq: Three times a day (TID) | SUBCUTANEOUS | Status: DC
  Administered 2018-06-24: 2 [IU] via SUBCUTANEOUS
  Administered 2018-06-24: 1 [IU] via SUBCUTANEOUS
  Administered 2018-06-24 – 2018-06-27 (×8): 2 [IU] via SUBCUTANEOUS
  Administered 2018-06-27: 1 [IU] via SUBCUTANEOUS

## 2018-06-24 MED ORDER — TORSEMIDE 20 MG PO TABS: 40.0000 mg | ORAL_TABLET | Freq: Every day | ORAL | Status: DC

## 2018-06-24 MED ORDER — ORAL CARE MOUTH RINSE
15.0000 mL | Freq: Two times a day (BID) | OROMUCOSAL | Status: DC
  Administered 2018-06-24 – 2018-06-27 (×5): 15 mL via OROMUCOSAL

## 2018-06-24 MED ORDER — GLYCOPYRROLATE 25 MCG/ML IN SOLN: 1.0000 mL | Freq: Two times a day (BID) | RESPIRATORY_TRACT | Status: DC

## 2018-06-24 MED ORDER — AMLODIPINE BESYLATE 10 MG PO TABS
10.0000 mg | ORAL_TABLET | Freq: Every day | ORAL | Status: DC
  Administered 2018-06-24 – 2018-06-27 (×4): 10 mg via ORAL
  Filled 2018-06-24 (×4): qty 1

## 2018-06-24 MED ORDER — ATORVASTATIN CALCIUM 40 MG PO TABS
40.0000 mg | ORAL_TABLET | Freq: Every day | ORAL | Status: DC
  Administered 2018-06-24 – 2018-06-27 (×4): 40 mg via ORAL
  Filled 2018-06-24 (×4): qty 1

## 2018-06-24 MED ORDER — SODIUM CHLORIDE 0.9 % IV SOLN: 250.0000 mL | INTRAVENOUS | Status: DC | PRN

## 2018-06-24 MED ORDER — FUROSEMIDE 10 MG/ML IJ SOLN: 80.0000 mg | Freq: Once | INTRAMUSCULAR | Status: DC

## 2018-06-24 MED ORDER — PANTOPRAZOLE SODIUM 40 MG IV SOLR
40.0000 mg | Freq: Two times a day (BID) | INTRAVENOUS | Status: DC
  Administered 2018-06-24 – 2018-06-25 (×4): 40 mg via INTRAVENOUS
  Filled 2018-06-24 (×4): qty 40

## 2018-06-24 MED ORDER — ONDANSETRON HCL 4 MG/2ML IJ SOLN: 4.0000 mg | Freq: Four times a day (QID) | INTRAMUSCULAR | Status: DC | PRN

## 2018-06-24 NOTE — H&P (Signed)
[facility] Admission History and Physical Service Pager: [phone]  Patient name: BRONISLAW SWITZER Medical record number: [id number] Date of birth: Jun 29, 1950 Age: 68 y.o. Gender: male  Primary Care Provider: Kathrene Alu, MD Consultants: GI Code Status: FULL  Chief Complaint: fatigue, melena, bloody emesis.   Assessment and Plan: LOTUS GOVER is a 68 y.o. male presenting with symptomatic anemia 2/2 GI bleed . PMH is significant for gastric adenocarcinoma, HFpEF, recurrent GI bleed, CKD, HTN, hypothyroidism, anemia of chronic disease  Symptomatic anemia 2/2 acute on chronic GI bleed - patient currently lethargic and hard of hearing.  Chronicity of these symptoms is uncertain, but he is known to have 1 episode of dark/black emesis and 1 episode of dark tarry stool today.  He has a history of GI bleeds and a recent diagnosis of gastric adenocarcinoma.  He follows with [facility].  He presented with a hemoglobin of 4.0.  Vitals have been stable. BP has remained in the normotensive range. Elevated BUN at 74, bloody emesis, and melanic stool indicates it is likely an upper GI source of bleeding, which is even more likely given his gastric adenocarcinoma. He has been given IV protonix, and transfusion of 2 U pRBC has been started.  We will check H&H after his transfusion is complete and consult [facility] in the AM.  - admit to inpatient, med tele, Dr. Owens Shark attending - consult [facility] in the AM.   - transfuse 2 U pRBC,  - post transfusion H&H - IV protonix 43m BID - NPO. Can give diet if GI does not scope pt.  - IV lasix [id number]  - vitals per routine - strict I/O - daily weights - continuous cardiac monitor and pulse ox -PT/OT  Gastric adenocarcinoma - discovered in February after an admission for anemia. Is being followed by GI, general surgery, and radiation oncology for this. Needs cardiac clearance for surgical distal gastrectomy.  Scheduled for  lexiscan cardiolite which has not been performed yet.  On 4/27 an upper endoscopic ultrasound was performed for staging of his cancer. Some erosions and stigmata of recent bleeding was noted.   - consult to [facility].  - FYI cards for lexiscan when patient improved - consult surg onc  Chronic anemia - multifactorial: CKD, chronic disease, recurrent GI bleed. seen by Dr. GoAlvy Bimlerith Heme/onc. Recent o/p blood transfusion of 2 U for hgb of 7.   - treatment as above for acute GI bleed - continue iron.   HTN - poorly controlled on several antihypertensives. Takes amlodipine 1059m hydral 100m61mD, imdur 120mg40m cardura 8mg q16mly, coreg 12.5mg BI77mand weekly clonidine patch 0.2mg  - 60mtinue home meds  HFpEF - echo in may 2017 shows EF 60-65%, G2DD. Echo on august 2019 showed EF 50-55% w/ moderate to severe LVH. Takes coreg at home 12.5mg BID 41m torsemide 40mg BID.51mt in exacerbation, BNP 261.3 which is lower than has been in the past. But will need close monitoring of fluid status as patient will likely need multiple transfusions. 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 does not have prosthesis for his leg.  Uses wheelchair to ambulate.  - PT/OT consulted  Hypothyroidism - takes 100mcg synth71m daily at home.  - continue home meds  COPD - not on home O2.  Takes lonhala magnair and duonebs PRN at home.  Still smokes about 3 cigarettes a day per most recent cardiology note.   - continue home meds.    CKD-IV - follows with Dr. Marval Regal outpatient.  Has a L AV fistula.  - renally dose meds - avoid nephrotoxic agents - am BMP  Diabetes Mellitus II - last A1c was one year ago 6.9. Takes novolog with meals at home.   - A1c - sSSI  FEN/GI: NPO/ protonix Prophylaxis: SCD  Disposition: med-tele  History of  Present Illness:  RAIF CHACHERE is a 68 y.o. male presenting with fatigue, melena, and bloody emesis.   The patient is hard of hearing and lethargic so obtaining a history was limited.   He endorsed being tired and confirms that he vomited today but cannot describe it.  The patient is currently at a SNF where it was reported he was more lethargic today than usual.  He had an episode of dark/black emesis followed by dark, tarry stool.  Per the patient's niece, who spoke with the ED physician, it is normal for him to become lethargic and hard of hearing when he becomes anemic.    The patient has gastric adenocarcinoma but had not yet begun radiation treatment d/t not yet having received clearance from his cardiologist. Per chart review does not appear to have been on chemo as was not a candidate.   In the ED a cbc revealed a Hgb of 4.0.  He received 60m IV protonix and a blood transfusion was started.   Unable to obtain ROS, PMH, PSH d/t patient's mental status.    Review Of Systems: Per HPI with the following additions:   Review of Systems  Unable to perform ROS: Acuity of condition    Patient Active Problem List   Diagnosis Date Noted  . Acute renal failure superimposed on chronic kidney disease ([facility]ake Viking   . Abdominal pain   . Adenocarcinoma ([facility]   . DNR (do not resuscitate)   . 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[facility]   . Palliative care encounter   . Symptomatic anemia 03/29/2018  . Acute GI bleeding 03/28/2018  . Dysphagia 10/01/2017  . Palliative care by specialist   . Severe comorbid illness   . Moderate malnutrition (HM[place] 08/27/2017  . Hypoxemia   . Atelectasis   . Chronic respiratory failure with hypoxia ([facility] 08/25/2017  . Pulmonary artery hypertension ([facility] 07/25/2017  . Pressure injury of skin 07/20/2017  . Goals of care, counseling/discussion 02/11/2017  . Diabetic polyneuropathy associated with type 2 diabetes mellitus (HL[place] 01/14/2017  . Pleural effusion   .  Chronic anemia 02/28/2016  . Diabetic retinopathy ([facility] 11/13/2015  . Iron deficiency anemia 08/03/2015  . Type II diabetes mellitus with complication (HP[place]   . Chronic kidney disease (CKD), stage III (moderate) (HCC)   . 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 Deficiency anemia 01/15/2015  . CKD (chronic kidney disease) stage 4, GFR 15-29 ml/min (HCC) 01/15/2015  . Hypothyroidism 11/22/2014  . COPD GOLD III  02/16/2014  . Anemia of renal disease   . Chronic diastolic CHF (congestive heart failure) (HGl[place]   . History of tobacco use 08/23/2013  . 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 CAD (coronary artery disease) 04/06/2013  . PVD - hx of Rt SFA PTA and s/p Rt 4-5th toe amp Dec 2014 04/05/2013  . 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 Mixed hyperlipidemia 07/17/2008  Past Medical History: Past Medical History:  Diagnosis Date  . Acute on chronic diastolic heart failure ([place]) 07/04/2014  . Anemia of renal disease   . Atherosclerotic peripheral vascular disease with gangrene ([place]) 02/14/2017  . CAD (coronary artery disease) 04/06/2013  . Cancer ([place])    skin cancer-  . CHF (congestive heart failure) ([place])   . Chronic anemia 02/28/2016  . Chronic deep vein thrombosis (DVT) of femoral vein of left lower extremity ([place]) 09/03/2016  . Chronic respiratory failure with hypoxia ([place]) 08/25/2017  . CKD (chronic kidney disease) stage 4, GFR 15-29 ml/min (HCC) 01/15/2015   Follows with [facility].  Last visits:  07/23/16: No change in meds. Worried that if we increase diuretics could lead to another AKI  . CKD (chronic kidney disease), stage IV ([place])    07/07/2017  . Claudication ([place])   . Collapse of left talus 01/14/2017  . COPD GOLD III  02/16/2014   Quit smoking 2014  - PFT's  04/06/13   FEV1 1.28 (44 % ) ratio 48  p no % improvement from saba p ?  prior to study with DLCO  43/54 % corrects to 97  % for alv volume - 01/31/2016  After extensive coaching HFA effectiveness =    75%  from baseline 0 > changed to sprivia respimat   - PFT's  03/17/2016  FEV1 2.25  (81 % ) ratio 88  p 2 % improvement from saba p spiriva respimat x 2  prior to study with  . Coronary artery disease   . Diabetes ([place])   . Diabetic polyneuropathy associated with type 2 diabetes mellitus ([place]) 01/14/2017  . Diabetic retinopathy ([place]) 11/13/2015   Per Dr. Erenest Rasher eye exam in 10/17/15, mild background diabetic retinopathy  . Fatigue associated with anemia 01/21/2016  . Gangrene of toe ([place])    right 5th/notes 01/04/2013   . HCAP (healthcare-associated pneumonia)   . Hematuria 06/27/2015  . High cholesterol   . History of tobacco use 08/23/2013   Smoked pack per day for 46 years Quit smoking - 02/03/2013   . Hypertension   . Hypothyroidism 11/22/2014  . Idiopathic chronic gout of left foot without tophus   . Iron deficiency anemia 08/03/2015  . Left foot pain 10/29/2016  . Long term (current) use of anticoagulants [Z79.01] 09/09/2016  . 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 Mixed hyperlipidemia 07/17/2008  . Morbid obesity due to excess calories ([place]) 03/17/2016  . PAD (peripheral artery disease) ([place])   . Pleural effusion   . Pneumonitis 07/26/2017  . Pulmonary artery hypertension ([place]) 07/25/2017  . PVD - hx of Rt SFA PTA and s/p Rt 4-5th toe amp Dec 2014 04/05/2013  . 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 Respiratory failure ([place]) 08/13/2017  . Respiratory failure with hypoxia ([place]) 09/23/2017  . 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 Secondary hypertension   . Shortness of breath dyspnea    with exertion  . Skin lesion of face 02/22/2015  . SOB (shortness of breath)   . Type 2 diabetes mellitus with pressure callus ([place]) 12/29/2016  . Type II diabetes mellitus (HCC)    Type II  . Type II diabetes mellitus with complication [facility])     Past Surgical History: Past Surgical History:  Procedure Laterality Date  . ABDOMINAL AORTOGRAM W/LOWER EXTREMITY N/A 03/12/2017   Procedure:  ABDOMINAL AORTOGRAM W/LOWER EXTREMITY;  Surgeon: Conrad Rosedale, MD;  Location: [place] CV LAB;  Service: Cardiovascular;  Laterality:  N/A;  . AMPUTATION Right 01/28/2013   Procedure: RAY AMPUTATION RIGHT  4th & 5th TOE;  Surgeon: Rosetta Posner, MD;  Location: [facility];  Service: Vascular;  Laterality: Right;  . AMPUTATION Left 03/15/2017   Procedure: AMPUTATION ABOVE KNEE;  Surgeon: Waynetta Sandy, MD;  Location: [place];  Service: Vascular;  Laterality: Left;  . ANGIOPLASTY / STENTING FEMORAL Right 01/13/2013   superficial femoral artery x 2 (3 mm x 60 mm, 4 mm x 60 mm)  . AV FISTULA PLACEMENT Left 02/11/2016   Procedure: LEFT UPPER ARM BRACHIAL CEPHALIC ARTERIOVENOUS (AV) FISTULA CREATION;  Surgeon: Conrad Cortland, MD;  Location: [facility] [place];  Service: Vascular;  Laterality: Left;  . BIOPSY  03/29/2018   Procedure: BIOPSY;  Surgeon: Rush Landmark Telford Nab., MD;  Location: [facility];  Service: Gastroenterology;;  . BIOPSY  05/31/2018   Procedure: BIOPSY;  Surgeon: Irving Copas., MD;  Location: [place];  Service: Gastroenterology;;  . COLONOSCOPY W/ POLYPECTOMY    . CORONARY ARTERY BYPASS GRAFT N/A 04/07/2013   Procedure: CORONARY ARTERY BYPASS GRAFTING (CABG);  Surgeon: Ivin Poot, MD;  Location: [place];  Service: Open Heart Surgery;  Laterality: N/A;  . ESOPHAGOGASTRODUODENOSCOPY (EGD) WITH PROPOFOL N/A 03/29/2018   Procedure: ESOPHAGOGASTRODUODENOSCOPY (EGD) WITH PROPOFOL;  Surgeon: Rush Landmark Telford Nab., MD;  Location: [facility];  Service: Gastroenterology;  Laterality: N/A;  . ESOPHAGOGASTRODUODENOSCOPY (EGD) WITH PROPOFOL N/A 05/31/2018   Procedure: ESOPHAGOGASTRODUODENOSCOPY (EGD) WITH PROPOFOL;  Surgeon: Rush Landmark Telford Nab., MD;  Location: [facility];  Service: Gastroenterology;  Laterality: N/A;  . EUS N/A 05/31/2018   Procedure: UPPER ENDOSCOPIC ULTRASOUND (EUS) RADIAL;  Surgeon: Rush Landmark Telford Nab., MD;  Location: [facility];  Service: Gastroenterology;   Laterality: N/A;  . INTRAOPERATIVE TRANSESOPHAGEAL ECHOCARDIOGRAM N/A 04/07/2013   Procedure: INTRAOPERATIVE TRANSESOPHAGEAL ECHOCARDIOGRAM;  Surgeon: Ivin Poot, MD;  Location: [facility];  Service: Open Heart Surgery;  Laterality: N/A;  . IR FLUORO GUIDE CV LINE RIGHT  11/04/2017  . IR REMOVAL TUN CV CATH W/O FL  11/27/2017  . IR US GUIDE VASC ACCESS RIGHT  11/04/2017  . LEFT HEART CATHETERIZATION WITH CORONARY ANGIOGRAM N/A 04/05/2013   Procedure: LEFT HEART CATHETERIZATION WITH CORONARY ANGIOGRAM;  Surgeon: Peter M Martinique, MD;  Location: [facility] CATH LAB;  Service: Cardiovascular;  Laterality: N/A;  . LOWER EXTREMITY ANGIOGRAM Bilateral 01/06/2013   Procedure: LOWER EXTREMITY ANGIOGRAM;  Surgeon: Conrad Millheim, MD;  Location: [facility] CATH LAB;  Service: Cardiovascular;  Laterality: Bilateral;  . LOWER EXTREMITY ANGIOGRAM Right 01/13/2013   Procedure: LOWER EXTREMITY ANGIOGRAM;  Surgeon: Conrad Shattuck, MD;  Location: [facility] CATH LAB;  Service: Cardiovascular;  Laterality: Right;  . SKIN CANCER EXCISION  2017  . THROAT SURGERY  1983   polyps  . TONSILLECTOMY AND ADENOIDECTOMY  ~ 1965    Social History: Social History   Tobacco Use  . Smoking status: Former Smoker    Packs/day: 1.00    Years: 46.00    Pack years: 46.00    Types: Cigarettes    Start date: 02/04/1967    Last attempt to quit: 02/03/2013    Years since quitting: 5.3  . Smokeless tobacco: Never Used  Substance Use Topics  . Alcohol use: No    Alcohol/week: 0.0 standard drinks  . Drug use: No   Additional social history:  Lives at [facility] Please also refer to relevant sections of EMR.  Family History: Family History  Problem Relation Age of Onset  . Cancer Mother  lung cancer  . Hypertension Mother   . Cancer Sister        patient thinks it was uterine cancer  . Hypertension Sister   . Heart attack Sister   . Stroke Neg Hx     Allergies and Medications: Allergies  Allergen Reactions  . Penicillins Other (See  Comments)    Has patient had a PCN reaction causing immediate rash, facial/tongue/throat swelling, SOB or lightheadedness with hypotension: Unknown Has patient had a PCN reaction causing severe rash involving mucus membranes or skin necrosis: Unknown Has patient had a PCN reaction that required hospitalization: Unknown Has patient had a PCN reaction occurring within the last 10 years: Unknown If all of the above answers are "NO", then may proceed with Cephalosporin use.    Current Facility-Administered Medications on File Prior to Encounter  Medication Dose Route Frequency Provider Last Rate Last Dose  . sodium chloride flush (NS) 0.9 % injection 10 mL  10 mL Intracatheter PRN Heath Lark, MD       Current Outpatient Medications on File Prior to Encounter  Medication Sig Dispense Refill  . acetaminophen (TYLENOL) 325 MG tablet Take 650 mg by mouth every 8 (eight) hours as needed for mild pain or fever.     Marland Kitchen amLODipine (NORVASC) 10 MG tablet Take 1 tablet (10 mg total) by mouth daily. 90 tablet 3  . aspirin EC 81 MG tablet Take 1 tablet (81 mg total) by mouth daily. 150 tablet 0  . atorvastatin (LIPITOR) 40 MG tablet TAKE ONE (1) TABLET BY MOUTH EVERY DAY AT 6:00PM (Patient taking differently: Take 40 mg by mouth daily at 6 PM. ) 90 tablet 3  . Blood Glucose Monitoring Suppl (ONETOUCH VERIO FLEX SYSTEM) w/Device KIT 1 kit by Does not apply route daily. 1 kit 3  . carvedilol (COREG) 12.5 MG tablet Take 1 tablet (12.5 mg total) by mouth 2 (two) times daily with a meal. 60 tablet 0  . cholecalciferol (VITAMIN D) 25 MCG (1000 UT) tablet Take 2,000 Units by mouth 3 (three) times daily.    . cloNIDine (CATAPRES - DOSED IN MG/24 HR) 0.2 mg/24hr patch Place 1 patch (0.2 mg total) onto the skin once a week. (Patient taking differently: Place 0.2 mg onto the skin every Tuesday. ) 4 patch 0  . Darbepoetin Alfa (ARANESP) 40 MCG/0.4ML SOSY injection Inject 0.4 mLs (40 mcg total) into the skin every 30  (thirty) days. 8.4 mL 2  . doxazosin (CARDURA) 8 MG tablet Take 1 tablet (8 mg total) by mouth every evening.    . feeding supplement, ENSURE ENLIVE, (ENSURE ENLIVE) LIQD Take 237 mLs by mouth 3 (three) times daily between meals. 237 mL 12  . ferrous sulfate (FEROSUL) 325 (65 FE) MG tablet Take 1 tablet (325 mg total) by mouth daily at 12 noon. (Patient taking differently: Take 325 mg by mouth daily. ) 30 tablet 0  . glucose blood (ONETOUCH VERIO) test strip Use as instructed to test three times daily. ICD-10 code: E11.22 100 each 12  . hydrALAZINE (APRESOLINE) 100 MG tablet TAKE ONE TABLET BY MOUTH EVERY EIGHT HOURS (Patient taking differently: Take 100 mg by mouth every 8 (eight) hours. ) 90 tablet 1  . insulin aspart (NOVOLOG) 100 UNIT/ML injection Inject 0-10 Units into the skin See admin instructions. Three times daily per sliding scale: <150 = 0 units 151-200 = 2 units 201-250 = 4 units 251-300 = 6 units 301-350 = 8 units 351-400 = 10 units Call PCP  if CBG <70 or >400    . ipratropium-albuterol (DUONEB) 0.5-2.5 (3) MG/3ML SOLN Take 3 mLs by nebulization every 6 (six) hours as needed (shortness of breath and wheezing).     . isosorbide mononitrate (IMDUR) 120 MG 24 hr tablet Take 1 tablet (120 mg total) by mouth daily. 30 tablet 0  . levothyroxine (SYNTHROID, LEVOTHROID) 100 MCG tablet Take 100 mcg by mouth daily before breakfast.     . LONHALA MAGNAIR STARTER KIT 25 MCG/ML SOLN Inhale 1 mL into the lungs 2 (two) times daily.    . nitroGLYCERIN (NITROSTAT) 0.4 MG SL tablet Place 1 tablet (0.4 mg total) under the tongue every 5 (five) minutes as needed for chest pain. 30 tablet 12  . ONETOUCH DELICA LANCETS 28M MISC 1 each by Does not apply route 3 (three) times daily. ICD-10 code: E11.22 100 each 12  . pantoprazole (PROTONIX) 40 MG tablet Take 1 tablet (40 mg total) by mouth 2 (two) times daily.    . polyethylene glycol (MIRALAX / GLYCOLAX) packet Take 17 g by mouth daily as needed for mild  constipation or moderate constipation. (Patient taking differently: Take 17 g by mouth daily as needed for mild constipation. ) 14 each 0  . potassium chloride SA (K-DUR,KLOR-CON) 20 MEQ tablet Take 2 tablets (40 mEq total) by mouth daily. 60 tablet 11  . torsemide (DEMADEX) 20 MG tablet Take 2 tablets (40 mg total) by mouth daily. 120 tablet 11    Objective: BP (!) 146/53   Pulse 73   Temp 97.8 F (36.6 C) (Rectal)   Resp 12   SpO2 100%  Exam: General: lethargic.  Awakens to voice and touch.  Hard of hearing and speaks only a few words before falling back asleep.  Eyes: PERRL.  EOMI.  Conjunctiva pale ENTM: moist oral mucosa.   Neck: no cervical LAD. No JVD Cardiovascular: regular rhythm. Normal rate. No murmurs.  Respiratory: LCTAB anteriorly.  No wheezes.  Gastrointestinal: soft, nontender.  Normal bowel sounds.  MSK: unable to assess strength. Trace pitting edema of R LE to mid shin Derm: no rashes on face or extremities.  Neuro: somnolent, awakens to touch. Psych: answers appropriately when is able to answer questions.    Labs and Imaging: CBC BMET  Recent Labs  Lab 06/23/18 2039  WBC 7.1  HGB 4.0*  HCT 13.2*  PLT 124*   Recent Labs  Lab 06/23/18 2039  NA 139  K 4.5  CL 109  CO2 20*  BUN 74*  CREATININE 3.50*  GLUCOSE 234*  CALCIUM 8.1*     CXR: Cardiomegaly.  Possible mild hazy infiltrate at the left base.  EKG: peaked T waves in V2-V4.  QTc 468.   Troponin I <0.03  BNP    Component Value Date/Time   BNP 261.3 (H) 06/23/2018 2039     Benay Pike, MD 06/23/2018, 10:41 PM PGY-1, [place] Intern pager: [phone], text pages welcome  FPTS Upper-Level Resident Addendum  I have independently interviewed and examined the patient. I have discussed the above with the original author and agree with their documentation. My edits for correction/addition/clarification are in blue. Please see also any attending notes.   Bufford Lope, DO PGY-3, [facility] 06/24/2018 6:26 AM  FPTS Service pager: [phone] (text pages welcome through [facility])

## 2018-06-24 NOTE — Progress Notes (Signed)
Report received from ED RN.

## 2018-06-24 NOTE — Progress Notes (Addendum)
Family Medicine Teaching Service Daily Progress Note Intern Pager: [phone]  [name] Medical record number: [id number] Date of birth: 05-01-1950 Age: 68 y.o. Gender: male  Primary Care Provider: Kathrene Alu, MD Consultants: Cards, GI, general surgery Code Status: Full  Pt Overview and Major Events to Date:  5/20: admitted to FPTS, 2UpRBC transfusion  Assessment and Plan: Leonard [name] is a 68 y.o. male presenting with symptomatic anemia 2/2 GI bleed . PMH is significant for gastric adenocarcinoma, HFpEF, recurrent GI bleed, CKD, HTN, hypothyroidism, anemia of chronic disease  Symptomatic anemia 2/2 acute on chronic GI bleed: S/p 1 day history of coffee-ground emesis and melena. Hgb 4.0 on admission likely upper GI bleed 2/2 to gastric adenocarcinoma. S/p 2U pRBC overnight with 3 unit this AM. History of chronic GI bleeds with recent diagnosis of gastric adenocarcinoma. Following with [facility]. Patient has remained hemodynamically stable on room air overnight. UOP overnight 1L without lasix and does not appear volume up.  - GI and general surgery consulted, will follow up recs  - post transfusion H/H  - IV protonix 40mg  BID - NPO pending Gi recs - continuous cardiac monitoring - zofran, tylenol PRN - continue home Torsemide 40mg  QD - if becomes SOB, consider lasix given 3U blood - vitals q4 hrs  Gastric adenocarcinoma:  Recent dx in 03/2018, followed by [facility] with plan to have distal gastrectomy by [name]. Awaiting cardiac clearance. He is high risk given comorbidities. Cardiology arranging lexiscan Cardiolite to assess for ischemia. Upper endoscopic ultrasound on 4/27 notable for erosions and stigmata of recent bleeding and malignant gastric tumor in the prepyloric region of the stomach invading into the duodenal bulb (T2N0). Previous PET-CT without lymphadenopathy finding. - cards consulted concerning Lexiscan  - [facility] and general surgery  consulted, will follow up - Consider surg/onc consult - continue PPI  Chronic anemia:  Multifactorial: CKD, chronic disease, recurrent GI bleed. Followed by [name] with Heme/onc. Recent o/p blood transfusion of 2 U for hgb of 7.  S/p 3U pRBC overnight. - treatment as above for acute GI bleed - continue iron.   HTN: uncontrolled BP this AM 146/60. Home meds: Hydralazine 100mg  TID, Imdur 120mg  QD, Cardura 8mg  QD, Coreg 12.5mg  BID, Norvasc 10mg , and clonidine patch 0.2mg  weekly.  - continue home meds - No PRN Hydral  - hold Cardura given lower diastolic BP - vitals q4  HFpEF -  Echo (06/2017): EF 50-55% with moderate to severe LVH. Home meds: Torsemide 40mg  QD, KCl 39mEq, and Coreg 12.5mg  BID. BNP on admission 261.3. Wt this AM 79.5kg (baseline 77-80kg). UOP ON 1L. Only trace edema on LE  this AM.  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 Does not have prosthesis for his leg.  Uses wheelchair to ambulate.  - follow up PT/OT recs  Hypothyroidism: Home meds: Synthroid 112mcg QD - continue home meds  COPD: stable Does not require O2 at home. Home meds: lonhala magnair and duonebs PRN at home.  Still smokes about 3 cigarettes a day per most recent cardiology note.   - continue home meds.    CKD-IV - follows with Dr. Marval Regal outpatient.  Has a L AV fistula.  - renally dose meds - avoid nephrotoxic agents -  am BMP  Diabetes Mellitus II : AIC 6.5.  Home meds: Novolog with meals.  - sSSI - CBGs  FEN/GI: NPO pending GI recs PPx: SCDs  Disposition: Pending workup  Subjective:  Patient notes feeling better this morning. He notes he is hard of hearing that is started approximately 1 week ago, unsure why.  Denies any nausea, vomiting, abdominal pain, headaches  or dizziness. Denies any further bowel movements.  Objective: Temp:  [97.8 F (36.6 C)-98.7 F (37.1 C)] 98.6 F (37 C) (05/21 0616) Pulse Rate:  [69-76] 69 (05/21 0616) Resp:  [11-22] 19 (05/21 0616) BP: (123-184)/(47-155) 146/60 (05/21 0616) SpO2:  [90 %-100 %] 99 % (05/21 0616) Weight:  [79.5 kg] 79.5 kg (05/21 0105) Physical Exam: General: pleasant older african [place] gentleman, well nourished, well developed, in no acute distress with non-toxic appearance, resting comfortably in bed, very hard of hearing HEENT: normocephalic, atraumatic, moist mucous membranes, PERRL Neck: supple, normal ROM CV: regular rate and rhythm without murmurs, rubs, or gallops, trace LE edema of RLE  Lungs: clear to auscultation bilaterally with normal work of breathing Abdomen: soft, non-tender, non-distended, normoactive bowel sounds Skin: warm, dry Extremities: warm and well perfused, left amputation Neuro: Alert and oriented, speech normal, hard of hearing  Laboratory: Recent Labs  Lab 06/23/18 2039  WBC 7.1  HGB 4.0*  HCT 13.2*  PLT 124*   Recent Labs  Lab 06/23/18 2039  NA 139  K 4.5  CL 109  CO2 20*  BUN 74*  CREATININE 3.50*  CALCIUM 8.1*  PROT 5.2*  BILITOT 0.5  ALKPHOS 96  ALT 8  AST 10*  GLUCOSE 234*   BNP: 261.3 Troponin: <0.03 PT/INR: 14.5/1.1 COVID: negative A1C: 6.5  Urinalysis    Component Value Date/Time   COLORURINE YELLOW 06/24/2018 0628   APPEARANCEUR CLOUDY (A) 06/24/2018 0628   LABSPEC 1.012 06/24/2018 0628   PHURINE 7.0 06/24/2018 0628   GLUCOSEU NEGATIVE 06/24/2018 0628   HGBUR NEGATIVE 06/24/2018 0628   BILIRUBINUR NEGATIVE 06/24/2018 0628   BILIRUBINUR NEG 06/26/2015 1540   KETONESUR NEGATIVE 06/24/2018 0628   PROTEINUR 100 (A) 06/24/2018 0628   UROBILINOGEN 1.0 06/26/2015 1540   UROBILINOGEN 0.2 07-05-202015 2156   NITRITE NEGATIVE 06/24/2018 0628   LEUKOCYTESUR LARGE (A) 06/24/2018 0628    Imaging/Diagnostic Tests: Dg Chest Portable  1 View  Result Date: 06/23/2018 CLINICAL DATA:  Weakness vomiting EXAM: PORTABLE CHEST 1 VIEW COMPARISON:  03/28/2018, PET-CT 04/21/2018 FINDINGS: Possible hazy infiltrate at the left base. Mild cardiomegaly. No pleural effusion or pneumothorax. IMPRESSION: Cardiomegaly.  Possible mild hazy infiltrate at the left base. Electronically Signed   By: Donavan Foil M.D.   On: 06/23/2018 21:27    Danna Hefty, DO 06/24/2018, 6:41 AM PGY-1, [facility] Intern pager: [phone], text pages welcome

## 2018-06-24 NOTE — Consult Note (Signed)
[facility] Consult: 10:31 AM 06/24/2018  LOS: 1 day    Referring Provider: Dr Dorris Singh MD  Primary Care Physician:  Kathrene Alu, MD Primary Gastroenterologist:  Dr. Fuller Plan.      Reason for Consultation: GI bleed in patient with gastric cancer.   HPI: Leonard [name] is a 68 y.o. male.  *Hx CKD 4, has left arm aVF in anticipation of ultimate need for HD.  CHF, diastolic.    COPD, pulmonary hypertension, severe obstruction on PFTs.  Still smoking 3 cigarettes daily.  DVT 08/2016, no longer on anticoagulation.  Severe PAD, status post L AKA.  Anemia, blood transfusions several times in 2019, monthly Aranesp and iron therapy for chronic anemia followed by hematology Dr Alvy Bimler.  Thrombocytopenia dates to at least 07/2017.     Patient diagnosed with gastric adenocarcinoma at EGD 03/29/2018.  He had presented with GI bleed in the form of dark stools, hematemesis.  The endoscopy showed non-bleeding gastric ulcer with flat, pigmented spot and gastritis, duodenitis.  Gastric ulcer biopsy confirmed adenocarcinoma.  There were no mets on a noncontrast CT. He ended up receiving 4 units PRBCs, Hgb 6.8 >> 8.8. Patient discharged to SNF where he remains resident. 05/31/18 EGD/EUS: EGD portion showed tortuous esophagus with patent, nonobstructing Schatzki's ring, small hiatal hernia.  Biopsies obtained of erythematous gastric mucosa, recently bleeding erosive gastropathy showed mild, chronic gastritis.  Dr. Rush Landmark did not repeat biopsy of malignant tumor in the prepyloric gastrum invading into the duodenal bulb.  Melanosis in duodenum.  EUS portion revealed stage III tumor at T2 N0 MX no malignant appearing lymph nodes.    04/21/2018 PET scan revealed hypermetabolic activity in the distal stomach/proximal duodenum  corresponding with the endoscopic findings of gastric cancer.  No other hypermetabolic activity or evidence of distal mets.  Extensive coronary and aortic atherosclerosis noted.  There is been a delay in surgery due to need for cardiac clearance.  Virtual visit with Dr. Aundra Dubin 06/11/2018.  Deemed high risk for surgery given multiple comorbidities.  [place] study planned, not yet completed.  If this is low risk study then pt can proceed to distal gastrectomy with incumbent risk.  If high risk study then xrt/chemo.  Patient presented from SNF with AMS/lethargy, melena, black emesis started yesterday Hgb 4 >> PRBC x >> 6.1 this AM.  Was 10.5 on 05/31/18.   GFR 20, Baseline 23 on 05/10/18.   Surgery has been consulted.   Pt denies abd pain   Past Medical History:  Diagnosis Date  . Acute on chronic diastolic heart failure ([place]) 07/04/2014  . Anemia of renal disease   . Atherosclerotic peripheral vascular disease with gangrene ([place]) 02/14/2017  . CAD (coronary artery disease) 04/06/2013  . Cancer ([place])    skin cancer-  . CHF (congestive heart failure) ([place])   . Chronic anemia 02/28/2016  . Chronic deep vein thrombosis (DVT) of femoral vein of left lower extremity ([place]) 09/03/2016  . Chronic respiratory failure with hypoxia ([place]) 08/25/2017  . CKD (chronic kidney disease) stage 4, GFR 15-29  ml/min ([place]) 01/15/2015   Follows with [facility].  Last visits:  07/23/16: No change in meds. Worried that if we increase diuretics could lead to another AKI  . CKD (chronic kidney disease), stage IV ([place])    07/07/2017  . Claudication ([place])   . Collapse of left talus 01/14/2017  . COPD GOLD III  02/16/2014   Quit smoking 2014  - PFT's  04/06/13   FEV1 1.28 (44 % ) ratio 48  p no % improvement from saba p ?  prior to study with DLCO  43/54 % corrects to 97  % for alv volume - 01/31/2016  After extensive coaching HFA effectiveness =    75% from baseline 0 > changed to sprivia respimat   - PFT's  03/17/2016   FEV1 2.25  (81 % ) ratio 88  p 2 % improvement from saba p spiriva respimat x 2  prior to study with  . Coronary artery disease   . Diabetes ([place])   . Diabetic polyneuropathy associated with type 2 diabetes mellitus ([place]) 01/14/2017  . Diabetic retinopathy ([place]) 11/13/2015   Per Dr. Erenest Rasher eye exam in 10/17/15, mild background diabetic retinopathy  . Fatigue associated with anemia 01/21/2016  . Gangrene of toe ([place])    right 5th/notes 01/04/2013   . HCAP (healthcare-associated pneumonia)   . Hematuria 06/27/2015  . High cholesterol   . History of tobacco use 08/23/2013   Smoked pack per day for 46 years Quit smoking - 02/03/2013   . Hypertension   . Hypothyroidism 11/22/2014  . Idiopathic chronic gout of left foot without tophus   . Iron deficiency anemia 08/03/2015  . Left foot pain 10/29/2016  . Long term (current) use of anticoagulants [Z79.01] 09/09/2016  . 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 Mixed hyperlipidemia 07/17/2008  . Morbid obesity due to excess calories ([place]) 03/17/2016  . PAD (peripheral artery disease) ([place])   . Pleural effusion   . Pneumonitis 07/26/2017  . Pulmonary artery hypertension ([place]) 07/25/2017  . PVD - hx of Rt SFA PTA and s/p Rt 4-5th toe amp Dec 2014 04/05/2013  . 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 Respiratory failure ([place]) 08/13/2017  . Respiratory failure with hypoxia ([place]) 09/23/2017  . 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 Secondary hypertension   . Shortness of breath dyspnea    with exertion  . Skin lesion of face 02/22/2015  . SOB (shortness of breath)   . Type 2 diabetes mellitus with pressure callus ([place]) 12/29/2016  . Type II diabetes mellitus (HCC)    Type II  . Type II diabetes mellitus with complication [facility])     Past Surgical History:  Procedure Laterality Date  . ABDOMINAL AORTOGRAM W/LOWER EXTREMITY N/A 03/12/2017   Procedure: ABDOMINAL AORTOGRAM W/LOWER EXTREMITY;  Surgeon: Conrad Duenweg, MD;  Location: [facility];   Service: Cardiovascular;  Laterality: N/A;  . AMPUTATION Right 01/28/2013   Procedure: RAY AMPUTATION RIGHT  4th & 5th TOE;  Surgeon: Rosetta Posner, MD;  Location: [place];  Service: Vascular;  Laterality: Right;  . AMPUTATION Left 03/15/2017   Procedure: AMPUTATION ABOVE KNEE;  Surgeon: Waynetta Sandy, MD;  Location: [place];  Service: Vascular;  Laterality: Left;  . ANGIOPLASTY / STENTING FEMORAL Right 01/13/2013   superficial femoral artery x 2 (3 mm x 60 mm, 4 mm x 60 mm)  . AV FISTULA PLACEMENT Left 02/11/2016   Procedure: LEFT UPPER ARM BRACHIAL CEPHALIC ARTERIOVENOUS (AV) FISTULA  CREATION;  Surgeon: Conrad Terrebonne, MD;  Location: [place];  Service: Vascular;  Laterality: Left;  . BIOPSY  03/29/2018   Procedure: BIOPSY;  Surgeon: Rush Landmark Telford Nab., MD;  Location: [place];  Service: Gastroenterology;;  . BIOPSY  05/31/2018   Procedure: BIOPSY;  Surgeon: Irving Copas., MD;  Location: [facility];  Service: Gastroenterology;;  . COLONOSCOPY W/ POLYPECTOMY    . CORONARY ARTERY BYPASS GRAFT N/A 04/07/2013   Procedure: CORONARY ARTERY BYPASS GRAFTING (CABG);  Surgeon: Ivin Poot, MD;  Location: [place];  Service: Open Heart Surgery;  Laterality: N/A;  . ESOPHAGOGASTRODUODENOSCOPY (EGD) WITH PROPOFOL N/A 03/29/2018   Procedure: ESOPHAGOGASTRODUODENOSCOPY (EGD) WITH PROPOFOL;  Surgeon: Rush Landmark Telford Nab., MD;  Location: [place];  Service: Gastroenterology;  Laterality: N/A;  . ESOPHAGOGASTRODUODENOSCOPY (EGD) WITH PROPOFOL N/A 05/31/2018   Procedure: ESOPHAGOGASTRODUODENOSCOPY (EGD) WITH PROPOFOL;  Surgeon: Rush Landmark Telford Nab., MD;  Location: [place];  Service: Gastroenterology;  Laterality: N/A;  . EUS N/A 05/31/2018   Procedure: UPPER ENDOSCOPIC ULTRASOUND (EUS) RADIAL;  Surgeon: Rush Landmark Telford Nab., MD;  Location: [place];  Service: Gastroenterology;  Laterality: N/A;  . INTRAOPERATIVE TRANSESOPHAGEAL ECHOCARDIOGRAM N/A 04/07/2013   Procedure:  INTRAOPERATIVE TRANSESOPHAGEAL ECHOCARDIOGRAM;  Surgeon: Ivin Poot, MD;  Location: [place];  Service: Open Heart Surgery;  Laterality: N/A;  . IR FLUORO GUIDE CV LINE RIGHT  11/04/2017  . IR REMOVAL TUN CV CATH W/O FL  11/27/2017  . IR US GUIDE VASC ACCESS RIGHT  11/04/2017  . LEFT HEART CATHETERIZATION WITH CORONARY ANGIOGRAM N/A 04/05/2013   Procedure: LEFT HEART CATHETERIZATION WITH CORONARY ANGIOGRAM;  Surgeon: Peter M Martinique, MD;  Location: [facility] C[facility] LAB;  Service: Cardiovascular;  Laterality: N/A;  . LOWER EXTREMITY ANGIOGRAM Bilateral 01/06/2013   Procedure: LOWER EXTREMITY ANGIOGRAM;  Surgeon: Conrad Summerhaven, MD;  Location: [facility] C[facility] LAB;  Service: Cardiovascular;  Laterality: Bilateral;  . LOWER EXTREMITY ANGIOGRAM Right 01/13/2013   Procedure: LOWER EXTREMITY ANGIOGRAM;  Surgeon: Conrad Benton, MD;  Location: [facility] CATH LAB;  Service: Cardiovascular;  Laterality: Right;  . SKIN CANCER EXCISION  2017  . THROAT SURGERY  1983   polyps  . TONSILLECTOMY AND ADENOIDECTOMY  ~ 1965    Prior to Admission medications   Medication Sig Start Date End Date Taking? Authorizing Provider  acetaminophen (TYLENOL) 325 MG tablet Take 650 mg by mouth every 8 (eight) hours as needed for mild pain or fever.     [provider]  amLODipine (NORVASC) 10 MG tablet Take 1 tablet (10 mg total) by mouth daily. 06/11/18   Larey Dresser, MD  aspirin EC 81 MG tablet Take 1 tablet (81 mg total) by mouth daily. 04/07/18 04/07/19  Daisy Floro, DO  atorvastatin (LIPITOR) 40 MG tablet TAKE ONE (1) TABLET BY MOUTH EVERY DAY AT 6:00PM Patient taking differently: Take 40 mg by mouth daily at 6 PM.  11/17/16   Larey Dresser, MD  Blood Glucose Monitoring Suppl ([place]) w/Device KIT 1 kit by Does not apply route daily. 10/10/16   McDiarmid, Blane Ohara, MD  carvedilol (COREG) 12.5 MG tablet Take 1 tablet (12.5 mg total) by mouth 2 (two) times daily with a meal. 08/30/17   Meccariello, Bernita Raisin, DO   cholecalciferol (VITAMIN D) 25 MCG (1000 UT) tablet Take 2,000 Units by mouth 3 (three) times daily.    [provider]  cloNIDine (CATAPRES - DOSED IN MG/24 HR) 0.2 mg/24hr patch Place 1 patch (0.2 mg total) onto  the skin once a week. Patient taking differently: Place 0.2 mg onto the skin every Tuesday.  08/09/17   Steve Rattler, DO  Darbepoetin Alfa (ARANESP) 40 MCG/0.4ML SOSY injection Inject 0.4 mLs (40 mcg total) into the skin every 30 (thirty) days. 09/28/17   Meccariello, Bernita Raisin, DO  doxazosin (CARDURA) 8 MG tablet Take 1 tablet (8 mg total) by mouth every evening. 12/01/16   Zenia Resides, MD  feeding supplement, ENSURE ENLIVE, (ENSURE ENLIVE) LIQD Take 237 mLs by mouth 3 (three) times daily between meals. 08/07/17   Steve Rattler, DO  ferrous sulfate (FEROSUL) 325 (65 FE) MG tablet Take 1 tablet (325 mg total) by mouth daily at 12 noon. Patient taking differently: Take 325 mg by mouth daily.  08/31/17   Meccariello, Bernita Raisin, DO  glucose blood (ONETOUCH VERIO) test strip Use as instructed to test three times daily. ICD-10 code: E11.22 10/15/16   McDiarmid, Blane Ohara, MD  hydrALAZINE (APRESOLINE) 100 MG tablet TAKE ONE TABLET BY MOUTH EVERY EIGHT HOURS Patient taking differently: Take 100 mg by mouth every 8 (eight) hours.  11/14/16   [name], MD  insulin aspart (NOVOLOG) 100 UNIT/ML injection Inject 0-10 Units into the skin See admin instructions. Three times daily per sliding scale: <150 = 0 units 151-200 = 2 units 201-250 = 4 units 251-300 = 6 units 301-350 = 8 units 351-400 = 10 units Call PCP if CBG <70 or >400    [provider]  ipratropium-albuterol (DUONEB) 0.5-2.5 (3) MG/3ML SOLN Take 3 mLs by nebulization every 6 (six) hours as needed (shortness of breath and wheezing).     [provider]  isosorbide mononitrate (IMDUR) 120 MG 24 hr tablet Take 1 tablet (120 mg total) by mouth daily. 10/02/17   Anderson, Chelsey L, DO  levothyroxine  (SYNTHROID, LEVOTHROID) 100 MCG tablet Take 100 mcg by mouth daily before breakfast.     [provider]  [facility] MAGNAIR STARTER KIT 25 MCG/ML SOLN Inhale 1 mL into the lungs 2 (two) times daily. 09/21/17   [provider]  nitroGLYCERIN (NITROSTAT) 0.4 MG SL tablet Place 1 tablet (0.4 mg total) under the tongue every 5 (five) minutes as needed for chest pain. 11/14/16   [name], MD  [facility] DELICA LANCETS 96E MISC 1 each by Does not apply route 3 (three) times daily. ICD-10 code: E11.22 10/15/16   McDiarmid, Blane Ohara, MD  pantoprazole (PROTONIX) 40 MG tablet Take 1 tablet (40 mg total) by mouth 2 (two) times daily. 04/03/18   Patriciaann Clan, DO  polyethylene glycol (MIRALAX / GLYCOLAX) packet Take 17 g by mouth daily as needed for mild constipation or moderate constipation. Patient taking differently: Take 17 g by mouth daily as needed for mild constipation.  08/07/17   Steve Rattler, DO  potassium chloride SA (K-DUR,KLOR-CON) 20 MEQ tablet Take 2 tablets (40 mEq total) by mouth daily. 02/16/18   Larey Dresser, MD  torsemide (DEMADEX) 20 MG tablet Take 2 tablets (40 mg total) by mouth daily. 02/16/18   Larey Dresser, MD    Scheduled Meds: . amLODipine  10 mg Oral Daily  . atorvastatin  40 mg Oral q1800  . carvedilol  12.5 mg Oral BID WC  . doxazosin  8 mg Oral QPM  . ferrous sulfate  325 mg Oral Q breakfast  . hydrALAZINE  100 mg Oral Q8H  . insulin aspart  0-9 Units Subcutaneous TID WC  . isosorbide  mononitrate  120 mg Oral Daily  . levothyroxine  100 mcg Oral QAC breakfast  . pantoprazole (PROTONIX) IV  40 mg Intravenous Q12H  . sodium chloride flush  3 mL Intravenous Q12H  . torsemide  40 mg Oral Daily   Infusions: . sodium chloride     PRN Meds: sodium chloride, ipratropium-albuterol, ondansetron **OR** ondansetron (ZOFRAN) IV, sodium chloride flush   Allergies as of 06/23/2018 - Review Complete 06/23/2018  Allergen Reaction Noted  .  Penicillins Other (See Comments) 08/13/2017    Family History  Problem Relation Age of Onset  . Cancer Mother        lung cancer  . Hypertension Mother   . Cancer Sister        patient thinks it was uterine cancer  . Hypertension Sister   . Heart attack Sister   . Stroke Neg Hx     Social History   Socioeconomic History  . Marital status: Single    Spouse name: Not on file  . Number of children: 0  . Years of education: Not on file  . Highest education level: Not on file  Occupational History  . Not on file  Social Needs  . Financial resource strain: Not on file  . Food insecurity:    Worry: Not on file    Inability: Not on file  . Transportation needs:    Medical: No    Non-medical: No  Tobacco Use  . Smoking status: Former Smoker    Packs/day: 1.00    Years: 46.00    Pack years: 46.00    Types: Cigarettes    Start date: 02/04/1967    Last attempt to quit: 02/03/2013    Years since quitting: 5.3  . Smokeless tobacco: Never Used  Substance and Sexual Activity  . Alcohol use: No    Alcohol/week: 0.0 standard drinks  . Drug use: No  . Sexual activity: Not Currently  Lifestyle  . Physical activity:    Days per week: Not on file    Minutes per session: Not on file  . Stress: Not on file  Relationships  . Social connections:    Talks on phone: Not on file    Gets together: Not on file    Attends religious service: Not on file    Active member of club or organization: Not on file    Attends meetings of clubs or organizations: Not on file    Relationship status: Not on file  . Intimate partner violence:    Fear of current or ex partner: Not on file    Emotionally abused: Not on file    Physically abused: Not on file    Forced sexual activity: Not on file  Other Topics Concern  . Not on file  Social History Narrative   Retired - former Sports coach.   Patient in skilled nursing facility.    REVIEW OF SYSTEMS: Constitutional:  No fatigue or weakness  ENT:  No nose bleeds Pulm:  No dyspnea, no cough CV:  No palpitations, no LE edema.  GU:  No hematuria, no frequency GI:  Per HPI Heme:  No excessive bleeding or bruising   Transfusions:  Per HPI Neuro:  No headaches, no peripheral tingling or numbness Derm:  No itching, no rash or sores.  Endocrine:  No sweats or chills.  No polyuria or dysuria Immunization:  reviewed Travel:  None    PHYSICAL EXAM: Vital signs in last 24 hours: Vitals:   06/24/18  0849 06/24/18 0913  BP: (!) 152/53 (!) 123/105  Pulse: 67 67  Resp: 18 18  Temp: 98.4 F (36.9 C) 98.2 F (36.8 C)  SpO2: 100% 100%   Wt Readings from Last 3 Encounters:  06/24/18 79.5 kg  05/31/18 79.4 kg  05/03/18 77.6 kg    General: looks ok, comfortable Head:  No assymetry or swelling  Eyes:  No pallor or icterus Ears:  HOH  Nose:  No discharge Mouth:  Moist, clear oral MM.  Tongue midline.  0ne partial tooh remains in lower righ Neck:  No mass or JVD Lungs:  Diminished, clear, no SOB or cough Heart: RRR.  No mrg.  s2 s2 present Abdomen:  Soft, NT, ND.  Active BS.   Rectal: not done   Musc/Skeltl: no joint swelling or redness Extremities:  Left BKA, healthy stump  Neurologic:  Lethargic but arouseable.  Oriented to place, self, not clear on year, date.  Moves all limbs.  No tremor or gross weakness.  HOH Skin:  No rash or sores Nodes:  No cervical adenopathy   Psych:  Cooperative, calm, follows commands  Intake/Output from previous day: 05/20 0701 - 05/21 0700 In: 0  Out: 500 [Urine:500] Intake/Output this shift: No intake/output data recorded.  LAB RESULTS: Recent Labs    06/23/18 2039 06/24/18 0844  WBC 7.1 6.3  HGB 4.0* 6.1*  HCT 13.2* 19.0*  PLT 124* 122*   BMET Lab Results  Component Value Date   NA 142 06/24/2018   NA 139 06/23/2018   NA 143 05/31/2018   K 4.0 06/24/2018   K 4.5 06/23/2018   K 4.3 05/31/2018   CL 111 06/24/2018   CL 109 06/23/2018   CL 111 05/10/2018   CO2 20 (L)  06/24/2018   CO2 20 (L) 06/23/2018   CO2 19 (L) 05/10/2018   GLUCOSE 206 (H) 06/24/2018   GLUCOSE 234 (H) 06/23/2018   GLUCOSE 192 (H) 05/31/2018   BUN 67 (H) 06/24/2018   BUN 74 (H) 06/23/2018   BUN 40 (H) 05/10/2018   CREATININE 3.40 (H) 06/24/2018   CREATININE 3.50 (H) 06/23/2018   CREATININE 3.10 (H) 05/10/2018   CALCIUM 8.3 (L) 06/24/2018   CALCIUM 8.1 (L) 06/23/2018   CALCIUM 8.6 (L) 05/10/2018   CALCIUM 8.4 (L) 05/10/2018   LFT Recent Labs    06/23/18 2039 06/24/18 0844  PROT 5.2* 5.2*  ALBUMIN 2.7* 2.7*  AST 10* 9*  ALT 8 8  ALKPHOS 96 93  BILITOT 0.5 0.8   PT/INR Lab Results  Component Value Date   INR 1.1 06/24/2018   INR 1.1 06/23/2018   INR 1.19 03/28/2018   Hepatitis Panel No results for input(s): HEPBSAG, HCVAB, HEPAIGM, HEPBIGM in the last 72 hours. C-Diff No components found for: CDIFF Lipase     Component Value Date/Time   LIPASE 39 08/06/2015 1730    Drugs of Abuse     Component Value Date/Time   LABOPIA NONE DETECTED 09/23/2017 1658   COCAINSCRNUR NONE DETECTED 09/23/2017 1658   COCAINSCRNUR NEG 12/20/2007 2016   LABBENZ NONE DETECTED 09/23/2017 1658   LABBENZ NEG 12/20/2007 2016   AMPHETMU NONE DETECTED 09/23/2017 1658   THCU NONE DETECTED 09/23/2017 1658   LABBARB NONE DETECTED 09/23/2017 1658     RADIOLOGY STUDIES: Dg Chest Portable 1 View  Result Date: 06/23/2018 CLINICAL DATA:  Weakness vomiting EXAM: PORTABLE CHEST 1 VIEW COMPARISON:  03/28/2018, PET-CT 04/21/2018 FINDINGS: Possible hazy infiltrate at the left base. Mild cardiomegaly. No  pleural effusion or pneumothorax. IMPRESSION: Cardiomegaly.  Possible mild hazy infiltrate at the left base. Electronically Signed   By: Donavan Foil M.D.   On: 06/23/2018 21:27     IMPRESSION:   *   GI bleed from gastric cancer.  No endoscopic options to control bleeding, case d/w Dr Tarri Glenn.   Best option is surgery, gastrectomy.  PA from surgery saw pt, waiting on MD to opine.  Waiting  on cardiology consult, but Dr Aundra Dubin had planned cardiolyte to finalize cardiology clearance in his 5/8 visit note  *  Blood loss anemia and anemia chronic dz from late stage CKD.  Hgb better after transfusions PRBC x 3.    *     Thrombocytopenia, noncritical, chronic.    PLAN:     *   Waiting on surgery and cardiolyte study.  No plans for EGD.    *  Transfuse prn.  Ok to continue BID Iv or po PPI.    *    Globin/hematocrit at 2 PM today.  CBC in the morning.  *  From GI view, ok to have clears or full liquid diet.    Azucena Freed  06/24/2018, 10:31 AM Phone [phone]

## 2018-06-24 NOTE — Progress Notes (Addendum)
Family Medicine Teaching Service Daily Progress Note Intern Pager: [phone]  [name] Medical record number: [id number] Date of birth: [birth year] Age: 68 y.o. Gender: male  Primary Care Provider: Kathrene Alu, MD Consultants: Cards, GI, general surgery Code Status: Full  Pt Overview and Major Events to Date:  5/20: admitted to [facility], 2UpRBC transfusion 5/22: Lexiscan in cardiology  Assessment and Plan: Leonard [name] is a 68 y.o. male presenting with symptomatic anemia 2/2 GI bleed . PMH is significant for gastric adenocarcinoma, HFpEF, recurrent GI bleed, CKD, HTN, hypothyroidism, anemia of chronic disease  Symptomatic anemia 2/2 acute on chronic GI bleed from gastric adenocarcinoma: stable S/p 3u pRBC's yesterday with improvement in Hgb from 4.0>7.1. Hgb this AM 7.0. Patient is procedure this AM. Will evaluate him s/p Lexiscan.  - see plan for gastric adenocarcinoma below - IV protonix 40mg  BID - continue to monitor Hgb and signs of bleeding - continuous cardiac monitoring - zofran, tylenol PRN - vitals q6  Gastric adenocarcinoma:  Recent dx in 03/2018, followed by [facility] with plan to have distal gastrectomy by Leonard [name]. Awaiting cardiac clearance. He is high risk given comorbidities. Patient in [place] this AM for cardiac evaluation. - cards consulted, patient in [place] at time of evaluation  - If low risk or normal, proceed with surgery   - If high risk:, consider chemotherapy/XRT rather than surgery.  Will need to discuss with surgical service in that situation.  - [facility] and general surgery consulted, plan pending cardiac clearance - Per Rad/onc, Leonard [name] has offered radiation if not a candidate for resection, Leonard [name] for chemo - continue IV PPI  Chronic anemia:  Multifactorial: CKD, chronic disease, recurrent GI bleed. Followed by Leonard [name] with Heme/onc. S/p 3U pRBC overnight. - treatment as above for acute GI bleed -  continue iron.   HTN: uncontrolled BP this AM 147/62. Home meds: Hydralazine 100mg  TID, Imdur 120mg  QD, Cardura 8mg  QD, Coreg 12.5mg  BID, Norvasc 10mg , and clonidine patch 0.2mg  weekly.  - continue home meds - No PRN Hydral  - hold Cardura given lower diastolic BP - vitals q4  HFpEF -  Echo (06/2017): EF 50-55% with moderate to severe LVH. Home meds: Torsemide 40mg  QD, KCl 29mEq, and Coreg 12.5mg  BID. BNP on admission 261.3. Wt table at 79.5kg (baseline 77-80kg). UOP ON 2.4L. 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 Does not have prosthesis for his leg. Uses wheelchair to ambulate.  - PT/OT - recommend SNF   Hypothyroidism: Home meds: Synthroid 155mcg QD - continue home meds  COPD: stable Does not require O2 at home. Home meds: lonhala magnair and duonebs PRN at home.  Still smokes about 3 cigarettes a day per most recent cardiology note.   - continue home meds.    CKD-IV - follows with Dr. Marval Regal outpatient.  Has a L AV fistula. Cr 3.40>3.46 (Baseline 2.5-3) - renally dose meds - avoid nephrotoxic agents including NSAIDs and contrast - am BMP  Diabetes Mellitus II : AIC 6.5.  Home meds: Novolog with meals.  - sSSI - CBGs  FEN/GI: NPO pending GI recs PPx: SCDs  Disposition: Pending workup  Subjective:  Unable to evaluate patient as he was already down  for [employer]. Will plan to evaluate patient in PM  Objective: Temp:  [98 F (36.7 C)-98.6 F (37 C)] 98.6 F (37 C) (05/22 0514) Pulse Rate:  [63-70] 70 (05/22 0514) Resp:  [18-20] 19 (05/22 0514) BP: (143-154)/(56-71) 147/62 (05/22 0514) SpO2:  [98 %-100 %] 99 % (05/22 0514) Weight:  [79.5 kg] 79.5 kg (05/22 0500) Physical Exam: **See interim progress note  in PM for physical exam **  Laboratory: Recent Labs  Lab 06/23/18 2039 06/24/18 0844 06/24/18 1415 06/25/18 0438  WBC 7.1 6.3  --  5.4  HGB 4.0* 6.1* 7.1* 7.0*  HCT 13.2* 19.0* 21.4* 21.0*  PLT 124* 122*  --  125*   Recent Labs  Lab 06/23/18 2039 06/24/18 0844 06/25/18 0438  NA 139 142 144  K 4.5 4.0 4.0  CL 109 111 111  CO2 20* 20* 22  BUN 74* 67* 60*  CREATININE 3.50* 3.40* 3.46*  CALCIUM 8.1* 8.3* 8.5*  PROT 5.2* 5.2*  --   BILITOT 0.5 0.8  --   ALKPHOS 96 93  --   ALT 8 8  --   AST 10* 9*  --   GLUCOSE 234* 206* 182*   BNP: 261.3 Troponin: <0.03 PT/INR: 14.5/1.1 COVID: negative A1C: 6.5 Hgb 4.0>6.1>7.1  Urinalysis    Component Value Date/Time   COLORURINE YELLOW 06/24/2018 0628   APPEARANCEUR CLOUDY (A) 06/24/2018 0628   LABSPEC 1.012 06/24/2018 0628   PHURINE 7.0 06/24/2018 0628   GLUCOSEU NEGATIVE 06/24/2018 0628   HGBUR NEGATIVE 06/24/2018 0628   BILIRUBINUR NEGATIVE 06/24/2018 0628   BILIRUBINUR NEG 06/26/2015 1540   KETONESUR NEGATIVE 06/24/2018 0628   PROTEINUR 100 (A) 06/24/2018 0628   UROBILINOGEN 1.0 06/26/2015 1540   UROBILINOGEN 0.2 2020-03-2213 2156   NITRITE NEGATIVE 06/24/2018 0628   LEUKOCYTESUR LARGE (A) 06/24/2018 0628    Imaging/Diagnostic Tests: Dg Chest Portable 1 View  Result Date: 06/23/2018 CLINICAL DATA:  Weakness vomiting EXAM: PORTABLE CHEST 1 VIEW COMPARISON:  03/28/2018, PET-CT 04/21/2018 FINDINGS: Possible hazy infiltrate at the left base. Mild cardiomegaly. No pleural effusion or pneumothorax. IMPRESSION: Cardiomegaly.  Possible mild hazy infiltrate at the left base. Electronically Signed   By: Donavan Foil M.D.   On: 06/23/2018 21:27    Leonard Hefty, DO 06/25/2018, 9:54 AM PGY-1, [facility] Intern pager: [phone], text pages welcome

## 2018-06-24 NOTE — Progress Notes (Signed)
Occupational Therapy Evaluation Patient Details Name: Leonard [name] MRN: [id number] DOB: [birth year] Today's Date: 06/24/2018    History of Present Illness Leonard [name] is a 68 year old male with PMH of CKD IV, gastric adneocarcinoma, hypothyroidism,  CAD s/p CABG, and PVD s/p left AKA presented to the hospital with acute anemia and gastric adenocarcinoma.    Clinical Impression   Pt presented with description of above. PTA pt PLOF requiring some assistance with ADLs and living at a SNF with rehab. Pt reports using a W/C for mobility with scoot. Pt currently requires Min A +2 in bed mobility from supine to EOB, Mod A functional transfers due to weakness and instability with RW. Pt will benefit from continued OT to address strength, safe engagement in ADLs, and functional transfers. DC to SNF for additional rehab. OT willl follow acutely.    Follow Up Recommendations  SNF    Equipment Recommendations       Recommendations for Other Services       Precautions / Restrictions Restrictions Weight Bearing Restrictions: No      Mobility Bed Mobility Overal bed mobility: Needs Assistance Bed Mobility: Supine to Sit     Supine to sit: Min assist;+2 for physical assistance     General bed mobility comments: Pt required 2 hand held assist to power up to sitting at EOB.   Transfers Overall transfer level: Needs assistance Equipment used: Rolling walker (2 wheeled) Transfers: Sit to/from Stand Sit to Stand: Mod assist;+2 physical assistance;+2 safety/equipment         General transfer comment: pt instructed to side step. Unable to perform tasks due to fatigue and low strength in standing tolerance. Pt will require education of safe standing transition and positioning for W/C transfers    Balance Overall balance assessment: Needs assistance Sitting-balance support: Bilateral upper extremity supported Sitting balance-Leahy Scale: Good     Standing balance support: Bilateral  upper extremity supported Standing balance-Leahy Scale: Poor                             ADL either performed or assessed with clinical judgement   ADL Overall ADL's : Needs assistance/impaired Eating/Feeding: Set up;Sitting   Grooming: Set up;Sitting   Upper Body Bathing: Independent   Lower Body Bathing: Moderate assistance   Upper Body Dressing : Independent   Lower Body Dressing: Moderate assistance Lower Body Dressing Details (indicate cue type and reason): t attempted to don sock while seated in bed, demonstrated low core strength to bring leg up to opposite knee. Toilet Transfer: Moderate assistance Toilet Transfer Details (indicate cue type and reason): simulated transfer from EOB to standing with RW. VCs required for safety and hand placement.                 Vision         Perception     Praxis      Pertinent Vitals/Pain Pain Assessment: No/denies pain     Hand Dominance Right   Extremity/Trunk Assessment Upper Extremity Assessment Upper Extremity Assessment: Overall WFL for tasks assessed   Lower Extremity Assessment Lower Extremity Assessment: Defer to PT evaluation       Communication Communication Communication: No difficulties;HOH   Cognition Arousal/Alertness: Awake/alert Behavior During Therapy: WFL for tasks assessed/performed Overall Cognitive Status: Within Functional Limits for tasks assessed  General Comments  Vitals checked after activities, pt demonstrated low SpO2 of 82%, possibly due to low hemoglobin level. BP 122/65    Exercises     Shoulder Instructions      Home Living Family/patient expects to be discharged to:: Skilled nursing facility                                 Additional Comments: Pt reports receiving therapy in SNF setting.      Prior Functioning/Environment Level of Independence: Needs assistance  Gait / Transfers Assistance  Needed: Reports he was working with therapy, however, performed transfer WC without assist.  ADL's / Homemaking Assistance Needed: Reports he requires some assist for ADLs             OT Problem List: Decreased strength;Decreased range of motion;Decreased activity tolerance;Impaired balance (sitting and/or standing);Decreased safety awareness;Decreased knowledge of use of DME or AE      OT Treatment/Interventions: Self-care/ADL training;Therapeutic exercise;DME and/or AE instruction;Therapeutic activities;Balance training;Patient/family education    OT Goals(Current goals can be found in the care plan section) Acute Rehab OT Goals Patient Stated Goal: To regain strength OT Goal Formulation: With patient Time For Goal Achievement: 07/08/18 Potential to Achieve Goals: Good  OT Frequency: Min 2X/week   Barriers to D/C:            Co-evaluation PT/OT/SLP Co-Evaluation/Treatment: Yes Reason for Co-Treatment: Complexity of the patient's impairments (multi-system involvement);To address functional/ADL transfers   OT goals addressed during session: ADL's and self-care;Strengthening/ROM      AM-PAC OT "6 Clicks" Daily Activity     Outcome Measure Help from another person eating meals?: None Help from another person taking care of personal grooming?: None Help from another person toileting, which includes using toliet, bedpan, or urinal?: Total Help from another person bathing (including washing, rinsing, drying)?: A Little Help from another person to put on and taking off regular upper body clothing?: None Help from another person to put on and taking off regular lower body clothing?: A Lot 6 Click Score: 18   End of Session Equipment Utilized During Treatment: Gait belt;Rolling walker Nurse Communication: Mobility status  Activity Tolerance: Patient limited by fatigue Patient left: in bed;with call bell/phone within reach  OT Visit Diagnosis: Unsteadiness on feet  (R26.81);Muscle weakness (generalized) (M62.81)                Time: 1224-8250 OT Time Calculation (min): 13 min Charges:  OT General Charges $OT Visit: 1 Visit OT Evaluation $OT Eval Low Complexity: 1 Low OT Treatments $Self Care/Home Management : 8-22 mins  Leonard [name], MSOT, OTR/L  Supplemental Rehabilitation Services  [phone]  Leonard [name] 06/24/2018, 3:17 PM

## 2018-06-24 NOTE — Consult Note (Signed)
[facility] Surgery Consult/Admission Note  Leonard [name] 1950/02/05  [id number].    Requesting MD: Dr. Mina Marble Chief Complaint/Reason for Consult: UGI bleed, gastric adenocarcinoma   HPI:   Pt is a 68 yo male with a hx of CAD prior CABG, PVD with L AKA, type II DM, CHF, stage IV CKD, COPD Gold stage III, HTN, monoclonal gammopathy of unknown significance, pulmonary HTN, hypothyroidism who was admitted overnight with symptomatic anemia 2/2 GI bleed. 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 At that time he was deemed not a candidate for chemotherapy by medical oncology. He is followed by [name].   Pt had another EGD/EUS on 04/27 by Dr. Rush Landmark which showed Tortuous esophagus. Widely patent and non-obstructing Schatzki ring. Small hiatal hernia. Erythematous mucosa in the cardia, incisura. Biopsied. Malignant gastric tumor in the prepyloric region of the stomach invading into the duodenal bulb was noted. Mucosal melanosis changes in the duodenum. EUS Impression: - A mass was found in the antrum of the stomach and in the prepyloric region of the stomach. A tissue diagnosis was obtained prior to this exam. This is consistent with adenocarcinoma. There was a loss of interface with invasion into the muscularis propria. I could not visualize the lesion extending through the muscularis mucosa into the adventitia. Thus it was staged T2 N0 Mx by endosonographic criteria. Previous PET-CT had not found any lymphadenopathy.  Pt had a telehealth note on 05/08 with Dr. Aundra Dubin who stated: Preoperative evaluation: Patient is at high risk for complications from gastric surgery.  However, surgery would be life-saving with gastric adenocarcinoma.  As above, given poor mobility, CAD, and PAD, I think that risk stratification via Lexiscan Cardiolite would be reasonable (will arrange).  If low risk or normal, I think that he  could proceed with surgery from a cardiac perspective.  However, he also has severe COPD and CKD stage IV, both of which will increase his overall risk for surgery.   Pt was seen by Dr. Excell Seltzer on 04/02/18 while admitted to hospital and he stated [place] of surgeons surgical risk calculator predicts a 31% risk of serious complication and 7% risk of death.  All complications significantly above average. We were asked to see for possible gastrectomy.   Pt states no abdominal pain at this time. He is hard of hearing and is not a great historian. He states no vomiting or BM since arrival to hospital. He states dark emesis and dark stools PTA at nursing facility. Pt had a Hgb of 4.0 yesterday. Today it is 6.1.      ROS:  Review of Systems  Constitutional: Negative for chills, diaphoresis and fever.  HENT: Negative for sore throat.   Respiratory: Negative for cough and shortness of breath.   Cardiovascular: Negative for chest pain.  Gastrointestinal: Positive for melena and vomiting (dark emesis ). Negative for abdominal pain, blood in stool, constipation, diarrhea and nausea.  Genitourinary: Negative for dysuria.  Skin: Negative for rash.  Neurological: Negative for dizziness and loss of consciousness.  All other systems reviewed and are negative.    Family History  Problem Relation Age of Onset  . Cancer Mother        lung cancer  . Hypertension Mother   . Cancer Sister        patient thinks it was uterine cancer  . Hypertension Sister   . Heart attack Sister   . Stroke Neg Hx  Past Medical History:  Diagnosis Date  . Acute on chronic diastolic heart failure ([place]) 07/04/2014  . Anemia of renal disease   . Atherosclerotic peripheral vascular disease with gangrene ([place]) 02/14/2017  . CAD (coronary artery disease) 04/06/2013  . Cancer ([place])    skin cancer-  . CHF (congestive heart failure) ([place])   . Chronic anemia 02/28/2016  . Chronic deep vein thrombosis (DVT) of  femoral vein of left lower extremity ([place]) 09/03/2016  . Chronic respiratory failure with hypoxia ([place]) 08/25/2017  . CKD (chronic kidney disease) stage 4, GFR 15-29 ml/min (HCC) 01/15/2015   Follows with [facility].  Last visits:  07/23/16: No change in meds. Worried that if we increase diuretics could lead to another AKI  . CKD (chronic kidney disease), stage IV ([place])    07/07/2017  . Claudication ([place])   . Collapse of left talus 01/14/2017  . COPD GOLD III  02/16/2014   Quit smoking 2014  - PFT's  04/06/13   FEV1 1.28 (44 % ) ratio 48  p no % improvement from saba p ?  prior to study with DLCO  43/54 % corrects to 97  % for alv volume - 01/31/2016  After extensive coaching HFA effectiveness =    75% from baseline 0 > changed to sprivia respimat   - PFT's  03/17/2016  FEV1 2.25  (81 % ) ratio 88  p 2 % improvement from saba p spiriva respimat x 2  prior to study with  . Coronary artery disease   . Diabetes ([place])   . Diabetic polyneuropathy associated with type 2 diabetes mellitus ([place]) 01/14/2017  . Diabetic retinopathy ([place]) 11/13/2015   Per Dr. Erenest Rasher eye exam in 10/17/15, mild background diabetic retinopathy  . Fatigue associated with anemia 01/21/2016  . Gangrene of toe ([place])    right 5th/notes 01/04/2013   . HCAP (healthcare-associated pneumonia)   . Hematuria 06/27/2015  . High cholesterol   . History of tobacco use 08/23/2013   Smoked pack per day for 46 years Quit smoking - 02/03/2013   . Hypertension   . Hypothyroidism 11/22/2014  . Idiopathic chronic gout of left foot without tophus   . Iron deficiency anemia 08/03/2015  . Left foot pain 10/29/2016  . Long term (current) use of anticoagulants [Z79.01] 09/09/2016  . 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 Mixed hyperlipidemia 07/17/2008  . Morbid obesity due to excess calories ([place]) 03/17/2016  . PAD (peripheral artery disease) ([place])   . Pleural effusion   . Pneumonitis 07/26/2017  . Pulmonary artery  hypertension ([place]) 07/25/2017  . PVD - hx of Rt SFA PTA and s/p Rt 4-5th toe amp Dec 2014 04/05/2013  . 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 Respiratory failure ([place]) 08/13/2017  . Respiratory failure with hypoxia ([place]) 09/23/2017  . 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 Secondary hypertension   . Shortness of breath dyspnea    with exertion  . Skin lesion of face 02/22/2015  . SOB (shortness of breath)   . Type 2 diabetes mellitus with pressure callus ([place]) 12/29/2016  . Type II diabetes mellitus (HCC)    Type II  . Type II diabetes mellitus with complication [facility])     Past Surgical History:  Procedure Laterality Date  . ABDOMINAL AORTOGRAM W/LOWER EXTREMITY N/A 03/12/2017   Procedure: ABDOMINAL AORTOGRAM W/LOWER EXTREMITY;  Surgeon: Conrad [place], MD;  Location: [place] [facility];  Service: Cardiovascular;  Laterality: N/A;  . AMPUTATION Right 01/28/2013  Procedure: RAY AMPUTATION RIGHT  4th & 5th TOE;  Surgeon: Rosetta Posner, MD;  Location: [place];  Service: Vascular;  Laterality: Right;  . AMPUTATION Left 03/15/2017   Procedure: AMPUTATION ABOVE KNEE;  Surgeon: Waynetta Sandy, MD;  Location: [place];  Service: Vascular;  Laterality: Left;  . ANGIOPLASTY / STENTING FEMORAL Right 01/13/2013   superficial femoral artery x 2 (3 mm x 60 mm, 4 mm x 60 mm)  . AV FISTULA PLACEMENT Left 02/11/2016   Procedure: LEFT UPPER ARM BRACHIAL CEPHALIC ARTERIOVENOUS (AV) FISTULA CREATION;  Surgeon: Conrad Alpaugh, MD;  Location: [place];  Service: Vascular;  Laterality: Left;  . BIOPSY  03/29/2018   Procedure: BIOPSY;  Surgeon: Rush Landmark Telford Nab., MD;  Location: [place];  Service: Gastroenterology;;  . BIOPSY  05/31/2018   Procedure: BIOPSY;  Surgeon: Irving Copas., MD;  Location: [place];  Service: Gastroenterology;;  . COLONOSCOPY W/ POLYPECTOMY    . CORONARY ARTERY BYPASS GRAFT N/A 04/07/2013   Procedure: CORONARY ARTERY BYPASS GRAFTING (CABG);  Surgeon: Ivin Poot, MD;  Location: [place];  Service: Open Heart Surgery;  Laterality: N/A;  . ESOPHAGOGASTRODUODENOSCOPY (EGD) WITH PROPOFOL N/A 03/29/2018   Procedure: ESOPHAGOGASTRODUODENOSCOPY (EGD) WITH PROPOFOL;  Surgeon: Rush Landmark Telford Nab., MD;  Location: [place];  Service: Gastroenterology;  Laterality: N/A;  . ESOPHAGOGASTRODUODENOSCOPY (EGD) WITH PROPOFOL N/A 05/31/2018   Procedure: ESOPHAGOGASTRODUODENOSCOPY (EGD) WITH PROPOFOL;  Surgeon: Rush Landmark Telford Nab., MD;  Location: [place];  Service: Gastroenterology;  Laterality: N/A;  . EUS N/A 05/31/2018   Procedure: UPPER ENDOSCOPIC ULTRASOUND (EUS) RADIAL;  Surgeon: Rush Landmark Telford Nab., MD;  Location: [place];  Service: Gastroenterology;  Laterality: N/A;  . INTRAOPERATIVE TRANSESOPHAGEAL ECHOCARDIOGRAM N/A 04/07/2013   Procedure: INTRAOPERATIVE TRANSESOPHAGEAL ECHOCARDIOGRAM;  Surgeon: Ivin Poot, MD;  Location: [place];  Service: Open Heart Surgery;  Laterality: N/A;  . IR FLUORO GUIDE CV LINE RIGHT  11/04/2017  . IR REMOVAL TUN CV CATH W/O FL  11/27/2017  . IR US GUIDE VASC ACCESS RIGHT  11/04/2017  . LEFT HEART CATHETERIZATION WITH CORONARY ANGIOGRAM N/A 04/05/2013   Procedure: LEFT HEART CATHETERIZATION WITH CORONARY ANGIOGRAM;  Surgeon: Peter M Martinique, MD;  Location: [facility] CATH LAB;  Service: Cardiovascular;  Laterality: N/A;  . LOWER EXTREMITY ANGIOGRAM Bilateral 01/06/2013   Procedure: LOWER EXTREMITY ANGIOGRAM;  Surgeon: Conrad [place], MD;  Location: [facility] C[facility] LAB;  Service: Cardiovascular;  Laterality: Bilateral;  . LOWER EXTREMITY ANGIOGRAM Right 01/13/2013   Procedure: LOWER EXTREMITY ANGIOGRAM;  Surgeon: Conrad Moore, MD;  Location: [facility] CATH LAB;  Service: Cardiovascular;  Laterality: Right;  . SKIN CANCER EXCISION  2017  . THROAT SURGERY  1983   polyps  . TONSILLECTOMY AND ADENOIDECTOMY  ~ 1965    Social History:  reports that he quit smoking about 5 years ago. His smoking use included cigarettes. He started smoking about 51 years ago. He has a  46.00 pack-year smoking history. He has never used smokeless tobacco. He reports that he does not drink alcohol or use drugs.  Allergies:  Allergies  Allergen Reactions  . Penicillins Other (See Comments)    Has patient had a PCN reaction causing immediate rash, facial/tongue/throat swelling, SOB or lightheadedness with hypotension: Unknown Has patient had a PCN reaction causing severe rash involving mucus membranes or skin necrosis: Unknown Has patient had a PCN reaction that required hospitalization: Unknown Has patient had a PCN reaction occurring within the last 10 years: Unknown If all of the above  answers are "NO", then may proceed with Cephalosporin use.     Medications Prior to Admission  Medication Sig Dispense Refill  . acetaminophen (TYLENOL) 325 MG tablet Take 650 mg by mouth every 8 (eight) hours as needed for mild pain or fever.     Marland Kitchen amLODipine (NORVASC) 10 MG tablet Take 1 tablet (10 mg total) by mouth daily. 90 tablet 3  . aspirin EC 81 MG tablet Take 1 tablet (81 mg total) by mouth daily. 150 tablet 0  . atorvastatin (LIPITOR) 40 MG tablet TAKE ONE (1) TABLET BY MOUTH EVERY DAY AT 6:00PM (Patient taking differently: Take 40 mg by mouth daily at 6 PM. ) 90 tablet 3  . Blood Glucose Monitoring Suppl (ONETOUCH VERIO FLEX SYSTEM) w/Device KIT 1 kit by Does not apply route daily. 1 kit 3  . carvedilol (COREG) 12.5 MG tablet Take 1 tablet (12.5 mg total) by mouth 2 (two) times daily with a meal. 60 tablet 0  . cholecalciferol (VITAMIN D) 25 MCG (1000 UT) tablet Take 2,000 Units by mouth 3 (three) times daily.    . cloNIDine (CATAPRES - DOSED IN MG/24 HR) 0.2 mg/24hr patch Place 1 patch (0.2 mg total) onto the skin once a week. (Patient taking differently: Place 0.2 mg onto the skin every Tuesday. ) 4 patch 0  . Darbepoetin Alfa (ARANESP) 40 MCG/0.4ML SOSY injection Inject 0.4 mLs (40 mcg total) into the skin every 30 (thirty) days. 8.4 mL 2  . doxazosin (CARDURA) 8 MG tablet Take  1 tablet (8 mg total) by mouth every evening.    . feeding supplement, ENSURE ENLIVE, (ENSURE ENLIVE) LIQD Take 237 mLs by mouth 3 (three) times daily between meals. 237 mL 12  . ferrous sulfate (FEROSUL) 325 (65 FE) MG tablet Take 1 tablet (325 mg total) by mouth daily at 12 noon. (Patient taking differently: Take 325 mg by mouth daily. ) 30 tablet 0  . glucose blood (ONETOUCH VERIO) test strip Use as instructed to test three times daily. ICD-10 code: E11.22 100 each 12  . hydrALAZINE (APRESOLINE) 100 MG tablet TAKE ONE TABLET BY MOUTH EVERY EIGHT HOURS (Patient taking differently: Take 100 mg by mouth every 8 (eight) hours. ) 90 tablet 1  . insulin aspart (NOVOLOG) 100 UNIT/ML injection Inject 0-10 Units into the skin See admin instructions. Three times daily per sliding scale: <150 = 0 units 151-200 = 2 units 201-250 = 4 units 251-300 = 6 units 301-350 = 8 units 351-400 = 10 units Call PCP if CBG <70 or >400    . ipratropium-albuterol (DUONEB) 0.5-2.5 (3) MG/3ML SOLN Take 3 mLs by nebulization every 6 (six) hours as needed (shortness of breath and wheezing).     . isosorbide mononitrate (IMDUR) 120 MG 24 hr tablet Take 1 tablet (120 mg total) by mouth daily. 30 tablet 0  . levothyroxine (SYNTHROID, LEVOTHROID) 100 MCG tablet Take 100 mcg by mouth daily before breakfast.     . LONHALA MAGNAIR STARTER KIT 25 MCG/ML SOLN Inhale 1 mL into the lungs 2 (two) times daily.    . nitroGLYCERIN (NITROSTAT) 0.4 MG SL tablet Place 1 tablet (0.4 mg total) under the tongue every 5 (five) minutes as needed for chest pain. 30 tablet 12  . ONETOUCH DELICA LANCETS 16X MISC 1 each by Does not apply route 3 (three) times daily. ICD-10 code: E11.22 100 each 12  . pantoprazole (PROTONIX) 40 MG tablet Take 1 tablet (40 mg total) by mouth 2 (two) times daily.    Marland Kitchen  polyethylene glycol (MIRALAX / GLYCOLAX) packet Take 17 g by mouth daily as needed for mild constipation or moderate constipation. (Patient taking  differently: Take 17 g by mouth daily as needed for mild constipation. ) 14 each 0  . potassium chloride SA (K-DUR,KLOR-CON) 20 MEQ tablet Take 2 tablets (40 mEq total) by mouth daily. 60 tablet 11  . torsemide (DEMADEX) 20 MG tablet Take 2 tablets (40 mg total) by mouth daily. 120 tablet 11    Blood pressure (!) 123/105, pulse 67, temperature 98.2 F (36.8 C), temperature source Oral, resp. rate 18, weight 79.5 kg, SpO2 100 %.  Physical Exam Vitals signs and nursing note reviewed.  Constitutional:      General: He is not in acute distress.    Appearance: He is well-developed. He is not diaphoretic.  HENT:     Head: Normocephalic and atraumatic.     Mouth/Throat:     Lips: Pink.     Mouth: Mucous membranes are moist.     Pharynx: Oropharynx is clear.  Eyes:     General: Lids are normal.     Conjunctiva/sclera: Conjunctivae normal.     Comments: Pupils are equal and round  Neck:     Musculoskeletal: Full passive range of motion without pain and normal range of motion.  Cardiovascular:     Rate and Rhythm: Normal rate.     Pulses:          Radial pulses are 2+ on the right side and 2+ on the left side.       Dorsalis pedis pulses are 1+ on the right side. Left dorsalis pedis pulse not accessible.       Left posterior tibial pulse not accessible.     Heart sounds: S1 normal and S2 normal.  Pulmonary:     Effort: Pulmonary effort is normal. No respiratory distress.     Breath sounds: Normal breath sounds. No decreased breath sounds, wheezing, rhonchi or rales.  Abdominal:     General: Bowel sounds are normal. There is no distension.     Palpations: Abdomen is soft. There is no hepatomegaly or splenomegaly.     Tenderness: There is no abdominal tenderness.  Musculoskeletal: Normal range of motion.        General: Deformity (L AKA) present. No swelling or tenderness.  Skin:    General: Skin is warm and dry.  Neurological:     General: No focal deficit present.     Mental  Status: He is alert.     Sensory: Sensation is intact.     Coordination: Coordination normal.  Psychiatric:        Mood and Affect: Mood normal.        Behavior: Behavior normal.     Results for orders placed or performed during the hospital encounter of 06/23/18 (from the past 48 hour(s))  Comprehensive metabolic panel     Status: Abnormal   Collection Time: 06/23/18  8:39 PM  Result Value Ref Range   Sodium 139 135 - 145 mmol/L   Potassium 4.5 3.5 - 5.1 mmol/L   Chloride 109 98 - 111 mmol/L   CO2 20 (L) 22 - 32 mmol/L   Glucose, Bld 234 (H) 70 - 99 mg/dL   BUN 74 (H) 8 - 23 mg/dL   Creatinine, Ser 3.50 (H) 0.61 - 1.24 mg/dL   Calcium 8.1 (L) 8.9 - 10.3 mg/dL   Total Protein 5.2 (L) 6.5 - 8.1 g/dL   Albumin 2.7 (L) 3.5 -  5.0 g/dL   AST 10 (L) 15 - 41 U/L   ALT 8 0 - 44 U/L   Alkaline Phosphatase 96 38 - 126 U/L   Total Bilirubin 0.5 0.3 - 1.2 mg/dL   GFR calc non Af Amer 17 (L) >60 mL/min   GFR calc Af Amer 20 (L) >60 mL/min   Anion gap 10 5 - 15    Comment: Performed at [facility] [address], [place], [place] [address]  Brain natriuretic peptide     Status: Abnormal   Collection Time: 06/23/18  8:39 PM  Result Value Ref Range   B Natriuretic Peptide 261.3 (H) 0.0 - 100.0 pg/mL    Comment: Performed at [facility] [address], [place], [place] [address]  Troponin I - Once     Status: None   Collection Time: 06/23/18  8:39 PM  Result Value Ref Range   Troponin I <0.03 <0.03 ng/mL    Comment: Performed at [facility] [address], [place], [place] [address]  CBC with Differential     Status: Abnormal   Collection Time: 06/23/18  8:39 PM  Result Value Ref Range   WBC 7.1 4.0 - 10.5 K/uL   RBC 1.37 (L) 4.22 - 5.81 MIL/uL   Hemoglobin 4.0 (LL) 13.0 - 17.0 g/dL    Comment: REPEATED TO VERIFY THIS CRITICAL RESULT HAS VERIFIED AND BEEN CALLED TO MYLAN BROOKS,RN BY TORI FOWLER ON 05 20 2020 AT 2117, AND HAS BEEN READ BACK.     HCT 13.2 (L)  39.0 - 52.0 %   MCV 96.4 80.0 - 100.0 fL   MCH 29.2 26.0 - 34.0 pg   MCHC 30.3 30.0 - 36.0 g/dL   RDW 16.7 (H) 11.5 - 15.5 %   Platelets 124 (L) 150 - 400 K/uL   nRBC 0.0 0.0 - 0.2 %   Neutrophils Relative % 77 %   Neutro Abs 5.5 1.7 - 7.7 K/uL   Lymphocytes Relative 11 %   Lymphs Abs 0.8 0.7 - 4.0 K/uL   Monocytes Relative 7 %   Monocytes Absolute 0.5 0.1 - 1.0 K/uL   Eosinophils Relative 4 %   Eosinophils Absolute 0.3 0.0 - 0.5 K/uL   Basophils Relative 0 %   Basophils Absolute 0.0 0.0 - 0.1 K/uL   Immature Granulocytes 1 %   Abs Immature Granulocytes 0.06 0.00 - 0.07 K/uL    Comment: Performed at [facility] [address], [place], [place] [address]  Protime-INR     Status: None   Collection Time: 06/23/18  8:39 PM  Result Value Ref Range   Prothrombin Time 14.5 11.4 - 15.2 seconds   INR 1.1 0.8 - 1.2    Comment: (NOTE) INR goal varies based on device and disease states. Performed at [facility], [place] [address] [place], [place] [address]   Type and screen [place]     Status: None (Preliminary result)   Collection Time: 06/23/18  8:45 PM  Result Value Ref Range   ABO/RH(D) B NEG    Antibody Screen NEG    Sample Expiration      06/26/2018,2359 Performed at [facility], [place] [address] [place], [place] [address]    Unit Number [id number]    Blood Component Type RBC, LR IRR    Unit division 00    Status of Unit ISSUED    Transfusion Status OK TO TRANSFUSE  Crossmatch Result Compatible    Unit Number [id number]    Blood Component Type RBC, LR IRR    Unit division 00    Status of Unit ISSUED    Transfusion Status OK TO TRANSFUSE    Crossmatch Result Compatible    Unit Number [id number]    Blood Component Type RBC, LR IRR    Unit division 00    Status of Unit ISSUED    Transfusion Status OK TO TRANSFUSE    Crossmatch Result Compatible    Unit Number [id number]    Blood Component Type RBC, LR IRR    Unit  division 00    Status of Unit ALLOCATED    Transfusion Status OK TO TRANSFUSE    Crossmatch Result Compatible   CBG monitoring, ED     Status: Abnormal   Collection Time: 06/23/18  8:59 PM  Result Value Ref Range   Glucose-Capillary 191 (H) 70 - 99 mg/dL  Prepare RBC     Status: None   Collection Time: 06/23/18  9:20 PM  Result Value Ref Range   Order Confirmation      ORDER PROCESSED BY BLOOD BANK Performed at [facility], [place] [address], [place], [place] [address]   SARS Coronavirus 2 (CEPHEID - Performed in [facility] hospital lab), Hosp Order     Status: None   Collection Time: 06/23/18 10:08 PM  Result Value Ref Range   SARS Coronavirus 2 NEGATIVE NEGATIVE    Comment: (NOTE) If result is NEGATIVE SARS-CoV-2 target nucleic acids are NOT DETECTED. The SARS-CoV-2 RNA is generally detectable in upper and lower  respiratory specimens during the acute phase of infection. The lowest  concentration of SARS-CoV-2 viral copies this assay can detect is 250  copies / mL. A negative result does not preclude SARS-CoV-2 infection  and should not be used as the sole basis for treatment or other  patient management decisions.  A negative result may occur with  improper specimen collection / handling, submission of specimen other  than nasopharyngeal swab, presence of viral mutation(s) within the  areas targeted by this assay, and inadequate number of viral copies  (<250 copies / mL). A negative result must be combined with clinical  observations, patient history, and epidemiological information. If result is POSITIVE SARS-CoV-2 target nucleic acids are DETECTED. The SARS-CoV-2 RNA is generally detectable in upper and lower  respiratory specimens dur ing the acute phase of infection.  Positive  results are indicative of active infection with SARS-CoV-2.  Clinical  correlation with patient history and other diagnostic information is  necessary to determine patient infection status.   Positive results do  not rule out bacterial infection or co-infection with other viruses. If result is PRESUMPTIVE POSTIVE SARS-CoV-2 nucleic acids MAY BE PRESENT.   A presumptive positive result was obtained on the submitted specimen  and confirmed on repeat testing.  While 2019 novel coronavirus  (SARS-CoV-2) nucleic acids may be present in the submitted sample  additional confirmatory testing may be necessary for epidemiological  and / or clinical management purposes  to differentiate between  SARS-CoV-2 and other Sarbecovirus currently known to infect humans.  If clinically indicated additional testing with an alternate test  methodology [phone]) is advised. The SARS-CoV-2 RNA is generally  detectable in upper and lower respiratory sp ecimens during the acute  phase of infection. The expected result is Negative. Fact Sheet for Patients:  StrictlyIdeas.no Fact Sheet for Healthcare Providers: BankingDealers.co.za This test is not yet approved  or cleared by the [place] and has been authorized for detection and/or diagnosis of SARS-CoV-2 by FDA under an Emergency Use Authorization (EUA).  This EUA will remain in effect (meaning this test can be used) for the duration of the COVID-19 declaration under Section 564(b)(1) of the Act, 21 U.S.C. section 360bbb-3(b)(1), unless the authorization is terminated or revoked sooner. Performed at [facility], [place] [address], [place], [place] [address]   Glucose, capillary     Status: Abnormal   Collection Time: 06/24/18  1:07 AM  Result Value Ref Range   Glucose-Capillary 209 (H) 70 - 99 mg/dL  MRSA PCR Screening     Status: None   Collection Time: 06/24/18  2:24 AM  Result Value Ref Range   MRSA by PCR NEGATIVE NEGATIVE    Comment:        The GeneXpert MRSA Assay (FDA approved for NASAL specimens only), is one component of a comprehensive MRSA colonization surveillance  program. It is not intended to diagnose MRSA infection nor to guide or monitor treatment for MRSA infections. Performed at [facility], [place] [address] [place], [place] [address]   Glucose, capillary     Status: Abnormal   Collection Time: 06/24/18  6:22 AM  Result Value Ref Range   Glucose-Capillary 190 (H) 70 - 99 mg/dL  Urinalysis, Routine w reflex microscopic     Status: Abnormal   Collection Time: 06/24/18  6:28 AM  Result Value Ref Range   Color, Urine YELLOW YELLOW   APPearance CLOUDY (A) CLEAR   Specific Gravity, Urine 1.012 1.005 - 1.030   pH 7.0 5.0 - 8.0   Glucose, UA NEGATIVE NEGATIVE mg/dL   Hgb urine dipstick NEGATIVE NEGATIVE   Bilirubin Urine NEGATIVE NEGATIVE   Ketones, ur NEGATIVE NEGATIVE mg/dL   Protein, ur 100 (A) NEGATIVE mg/dL   Nitrite NEGATIVE NEGATIVE   Leukocytes,Ua LARGE (A) NEGATIVE   RBC / HPF 0-5 0 - 5 RBC/hpf   WBC, UA >50 (H) 0 - 5 WBC/hpf   Bacteria, UA MANY (A) NONE SEEN   Squamous Epithelial / LPF 0-5 0 - 5   WBC Clumps PRESENT     Comment: Performed at [facility], [place] [address] [place], [place] [address]  CBC     Status: Abnormal   Collection Time: 06/24/18  8:44 AM  Result Value Ref Range   WBC 6.3 4.0 - 10.5 K/uL   RBC 2.09 (L) 4.22 - 5.81 MIL/uL   Hemoglobin 6.1 (LL) 13.0 - 17.0 g/dL    Comment: REPEATED TO VERIFY POST TRANSFUSION SPECIMEN THIS CRITICAL RESULT HAS VERIFIED AND BEEN CALLED TO HERRERA,N RN BY SHANNON FLEMING ON 05 21 2020 AT 0930, AND HAS BEEN READ BACK.     HCT 19.0 (L) 39.0 - 52.0 %   MCV 90.9 80.0 - 100.0 fL   MCH 29.2 26.0 - 34.0 pg   MCHC 32.1 30.0 - 36.0 g/dL   RDW 16.0 (H) 11.5 - 15.5 %   Platelets 122 (L) 150 - 400 K/uL   nRBC 0.0 0.0 - 0.2 %    Comment: Performed at [facility] [address], [place], [place] [address]  Protime-INR     Status: None   Collection Time: 06/24/18  8:44 AM  Result Value Ref Range   Prothrombin Time 14.3 11.4 - 15.2 seconds   INR 1.1 0.8 - 1.2     Comment: (NOTE) INR goal varies based on  device and disease states. Performed at [facility], [place] [address], [place], [place] [address]   APTT     Status: None   Collection Time: 06/24/18  8:44 AM  Result Value Ref Range   aPTT 31 24 - 36 seconds    Comment: Performed at [facility] [address] [place], [place] [id number]  Hemoglobin A1c     Status: Abnormal   Collection Time: 06/24/18  8:44 AM  Result Value Ref Range   Hgb A1c MFr Bld 6.5 (H) 4.8 - 5.6 %    Comment: (NOTE) Pre diabetes:          5.7%-6.4% Diabetes:              >6.4% Glycemic control for   <7.0% adults with diabetes    Mean Plasma Glucose 139.85 mg/dL    Comment: Performed at [facility] [address], [place], [place] [address]   *Note: Due to a large number of results and/or encounters for the requested time period, some results have not been displayed. A complete set of results can be found in Results Review.   Dg Chest Portable 1 View  Result Date: 06/23/2018 CLINICAL DATA:  Weakness vomiting EXAM: PORTABLE CHEST 1 VIEW COMPARISON:  03/28/2018, PET-CT 04/21/2018 FINDINGS: Possible hazy infiltrate at the left base. Mild cardiomegaly. No pleural effusion or pneumothorax. IMPRESSION: Cardiomegaly.  Possible mild hazy infiltrate at the left base. Electronically Signed   By: Donavan Foil M.D.   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  - pt is not a great surgical candidate due to extensive co morbidities but he mey need gastrectomy due to continued bleeding and severe anemia - will discuss with [name] and Dr. Donne Hazel today  - continue NPO at this time   Thank you for the consult.    Kalman Drape, [facility] Surgery 06/24/2018, 9:34 AM Pager: [phone] Consults: [phone] Mon-Fri 7:00 am-4:30 pm Sat-Sun 7:00 am-11:30 am

## 2018-06-24 NOTE — Progress Notes (Signed)
New Admission Note:  Arrival Method: Via stretcher from the ED.  Mental Orientation: Alert to Voice & Oriented to Person, Place. Telemetry: CCMD verified Assessment: Completed Skin: Refer to flowsheet IV: Right AC Pain: 0/ Safety Measures: Safety Fall Prevention Plan discussed with patient. Admission: Completed [facility] [place] Orientation: Patient has been orientated to the room, unit and the staff.  Orders have been reviewed and are being implemented. Will continue to monitor the patient. Call light has been placed within reach and bed alarm has been activated.   Vassie Moselle, RN  Phone Number: [phone]

## 2018-06-24 NOTE — Telephone Encounter (Signed)
The patient's niece called to let us know that her uncle was in the hospital due to hematemesis. It appears he's having his cardiac work up tomorrow to determine if he is a gastrectomy candidate. We will follow along with his course and Dr. Lisbeth Renshaw has offered chemoRT if he is not a candidate for resection. His medical oncologist is [name].

## 2018-06-24 NOTE — Progress Notes (Signed)
Physical Therapy Evaluation Patient Details Name: Leonard [name] MRN: [id number] DOB: 03/12/1950 Today's Date: 06/24/2018   History of Present Illness  Leonard [name] is a 68 year old male with PMH of CKD IV, gastric adneocarcinoma, hypothyroidism,  CAD s/p CABG, and PVD s/p left AKA presented to the hospital with acute anemia and gastric adenocarcinoma.     Clinical Impression  Pt admitted with above diagnosis. Pt currently with functional limitations due to the deficits listed below (see PT Problem List). PTA, pt living at SNF, working with therapies, able to transfer on and off w/c. Today, weaker than baseline, stood EOB, desat to 84% on RA. Will cont to follow.  Pt will benefit from skilled PT to increase their independence and safety with mobility to allow discharge to the venue listed below.       Follow Up Recommendations SNF;Supervision/Assistance - 24 hour    Equipment Recommendations  (TBD next venue)    Recommendations for Other Services       Precautions / Restrictions Restrictions Weight Bearing Restrictions: No      Mobility  Bed Mobility Overal bed mobility: Needs Assistance Bed Mobility: Supine to Sit     Supine to sit: Min assist;+2 for physical assistance     General bed mobility comments: Pt required 2 hand held assist to power up to sitting at EOB.   Transfers Overall transfer level: Needs assistance Equipment used: Rolling walker (2 wheeled) Transfers: Sit to/from Stand Sit to Stand: Mod assist;+2 physical assistance;+2 safety/equipment         General transfer comment: pt instructed to side step. Unable to perform tasks due to fatigue and low strength in standing tolerance. Pt will require education of safe standing transition and positioning for W/C transfers  Ambulation/Gait             General Gait Details: deferred as patient became drowsy, returned to bed, vitals checked WNL BP and HR, SpO2 84% on RA  Stairs            Wheelchair  Mobility    Modified Rankin (Stroke Patients Only)       Balance Overall balance assessment: Needs assistance Sitting-balance support: Bilateral upper extremity supported Sitting balance-Leahy Scale: Good     Standing balance support: Bilateral upper extremity supported Standing balance-Leahy Scale: Poor                               Pertinent Vitals/Pain Pain Assessment: No/denies pain    Home Living Family/patient expects to be discharged to:: Skilled nursing facility                 Additional Comments: Pt reports receiving therapy in SNF setting.    Prior Function Level of Independence: Needs assistance   Gait / Transfers Assistance Needed: Reports he was working with therapy, however, performed transfer WC without assist.   ADL's / Homemaking Assistance Needed: Reports he requires some assist for ADLs         Hand Dominance   Dominant Hand: Right    Extremity/Trunk Assessment   Upper Extremity Assessment Upper Extremity Assessment: Overall WFL for tasks assessed    Lower Extremity Assessment Lower Extremity Assessment: Overall WFL for tasks assessed(L AKA )       Communication   Communication: No difficulties;HOH  Cognition Arousal/Alertness: Awake/alert Behavior During Therapy: WFL for tasks assessed/performed Overall Cognitive Status: Within Functional Limits for tasks assessed  General Comments General comments (skin integrity, edema, etc.): Vitals checked after activities, pt demonstrated low SpO2 of 82%, possibly due to low hemoglobin level. BP 122/65    Exercises     Assessment/Plan    PT Assessment Patient needs continued PT services  PT Problem List Decreased strength       PT Treatment Interventions DME instruction;Gait training;Stair training;Functional mobility training;Therapeutic activities;Therapeutic exercise;Balance training    PT Goals (Current goals  can be found in the Care Plan section)  Acute Rehab PT Goals Patient Stated Goal: To regain strength PT Goal Formulation: With patient Potential to Achieve Goals: Fair    Frequency Min 3X/week   Barriers to discharge        Co-evaluation PT/OT/SLP Co-Evaluation/Treatment: Yes Reason for Co-Treatment: Complexity of the patient's impairments (multi-system involvement);For patient/therapist safety PT goals addressed during session: Mobility/safety with mobility;Strengthening/ROM;Proper use of DME;Balance OT goals addressed during session: ADL's and self-care;Strengthening/ROM       AM-PAC PT "6 Clicks" Mobility  Outcome Measure Help needed turning from your back to your side while in a flat bed without using bedrails?: A Little Help needed moving from lying on your back to sitting on the side of a flat bed without using bedrails?: A Little Help needed moving to and from a bed to a chair (including a wheelchair)?: A Lot Help needed standing up from a chair using your arms (e.g., wheelchair or bedside chair)?: A Lot Help needed to walk in hospital room?: A Lot Help needed climbing 3-5 steps with a railing? : Total 6 Click Score: 13    End of Session Equipment Utilized During Treatment: Gait belt Activity Tolerance: Patient tolerated treatment well Patient left: in bed Nurse Communication: Mobility status PT Visit Diagnosis: Unsteadiness on feet (R26.81)    Time: 1440-1505 PT Time Calculation (min) (ACUTE ONLY): 25 min   Charges:   PT Evaluation $PT Eval Moderate Complexity: 1 Mod         Reinaldo Berber, PT, DPT Acute Rehabilitation Services Pager: [phone] Office: [phone]     Reinaldo Berber 06/24/2018, 4:59 PM

## 2018-06-24 NOTE — Consult Note (Addendum)
Cardiology Consult    Patient ID: Leonard [name] MRN: [id number], DOB/AGE: [birth year]   Admit date: 06/23/2018 Date of Consult: 06/24/2018  Primary Physician: Kathrene Alu, MD Primary Cardiologist: Loralie Champagne, MD Requesting Provider: Dorris Singh, MD (Internal Medicine) and Jackson Latino, PA-C (General Surgery)  Patient Profile    Leonard [name] is a 68 y.o. male with a history of CAD s/p CABG x3 in 04/2013, PAD s/p left above knee amputation in 03/8766, chronic diastolic CHF, COPD on home O2, CKD stage IV, hypertension, hyperlipidemia, type 2 diabetes mellitus, and recent diagnosis gastric adenocarcinoma in 03/2018 who was admitted on 06/23/2018 acute anemia seondary to chronic GI bleed and hemoglobin of 4. GI and General Surgery following. Cardiology consulted for pre-operative evaluation for possible gastrectomy at the request of Dr. Owens Shark (Internal Medicine) and Jackson Latino, PA-C (General Surgery).  History of Present Illness    Leonard [name] is a 68 year old male with the above history who is followed by Dr. Aundra Dubin. Patient has had multiple hospitalization for acute on chronic diastolic CHF over the last year. He was recently admitted in 03/2018 with anemia and was found to have gastric adenocarcinoma. He has been seen by GI and General Surgery and ideally would have distal gastrectomy with [name].   Patient was seen by Dr. Aundra Dubin on 06/11/2018 via virtual visit for pre-operative evaluation. Per note at that visit, patient does not walk because he does not have a prosthesis that will fit his leg. Although he is fairly mobile with a wheelchair and reported being able to get himself down the hall to the dining room at his SNF with only mild dyspnea and his able to do his transfers. He denies any orthopnea or PND. Weight was remaining stable in SNF per his nurse at that time. Dr. Aundra Dubin felt patient would be high risk for surgery with his CKD stage IV, severe PAD, CAD s/p CABG, severe  COPD, and diastolic CHF. However, the cancer will be most effectively treated with surgery so plan was to get a Lexiscan Myoview to assess for ischemic and if low risk, patient could proceed with surgery from a cardiac standpoint. However, patient has not had this done yet.  Patient admitted yesterday from SNF after presenting with 3 episodes of black tarry stools as well as bloody emesis and being found to have acute anemia with hemoglobin of 4.0. He was admitted for further management. He was started on IV Protonix and has been transfused 3 unit of PRBCs so far. GI and General Surgery have been consulted. Cardiology was consulted to help arrange [facility] as part of pre-operative evaluation.    In an effort to minimize exposure given the current COVID-19 pandemic, I attempted to call into the patient's room to obtain additional history. However, I was unable to reach the patient. Spoke with the RN who said patient was very hard of hearing and would have a hard time speaking on the phone. MD to see and obtain any additional history.  Past Medical History   Past Medical History:  Diagnosis Date  . Acute on chronic diastolic heart failure ([place]) 07/04/2014  . Anemia of renal disease   . Atherosclerotic peripheral vascular disease with gangrene ([place]) 02/14/2017  . CAD (coronary artery disease) 04/06/2013  . Cancer ([place])    skin cancer-  . CHF (congestive heart failure) ([place])   . Chronic anemia 02/28/2016  . Chronic deep vein thrombosis (DVT) of femoral vein of left lower extremity (  [place]) 09/03/2016  . Chronic respiratory failure with hypoxia ([place]) 08/25/2017  . CKD (chronic kidney disease) stage 4, GFR 15-29 ml/min (HCC) 01/15/2015   Follows with [facility].  Last visits:  07/23/16: No change in meds. Worried that if we increase diuretics could lead to another AKI  . CKD (chronic kidney disease), stage IV ([place])    07/07/2017  . Claudication ([place])   . Collapse of left talus 01/14/2017  . COPD  GOLD III  02/16/2014   Quit smoking 2014  - PFT's  04/06/13   FEV1 1.28 (44 % ) ratio 48  p no % improvement from saba p ?  prior to study with DLCO  43/54 % corrects to 97  % for alv volume - 01/31/2016  After extensive coaching HFA effectiveness =    75% from baseline 0 > changed to sprivia respimat   - PFT's  03/17/2016  FEV1 2.25  (81 % ) ratio 88  p 2 % improvement from saba p spiriva respimat x 2  prior to study with  . Coronary artery disease   . Diabetes ([place])   . Diabetic polyneuropathy associated with type 2 diabetes mellitus ([place]) 01/14/2017  . Diabetic retinopathy ([place]) 11/13/2015   Per Dr. Erenest Rasher eye exam in 10/17/15, mild background diabetic retinopathy  . Fatigue associated with anemia 01/21/2016  . Gangrene of toe ([place])    right 5th/notes 01/04/2013   . HCAP (healthcare-associated pneumonia)   . Hematuria 06/27/2015  . High cholesterol   . History of tobacco use 08/23/2013   Smoked pack per day for 46 years Quit smoking - 02/03/2013   . Hypertension   . Hypothyroidism 11/22/2014  . Idiopathic chronic gout of left foot without tophus   . Iron deficiency anemia 08/03/2015  . Left foot pain 10/29/2016  . Long term (current) use of anticoagulants [Z79.01] 09/09/2016  . 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 Mixed hyperlipidemia 07/17/2008  . Morbid obesity due to excess calories ([place]) 03/17/2016  . PAD (peripheral artery disease) ([place])   . Pleural effusion   . Pneumonitis 07/26/2017  . Pulmonary artery hypertension ([place]) 07/25/2017  . PVD - hx of Rt SFA PTA and s/p Rt 4-5th toe amp Dec 2014 04/05/2013  . 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 Respiratory failure ([place]) 08/13/2017  . Respiratory failure with hypoxia ([place]) 09/23/2017  . 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 Secondary hypertension   . Shortness of breath dyspnea    with exertion  . Skin lesion of face 02/22/2015  . SOB (shortness of breath)   . Type 2 diabetes mellitus with pressure callus ([place]) 12/29/2016  .  Type II diabetes mellitus (HCC)    Type II  . Type II diabetes mellitus with complication [facility])     Past Surgical History:  Procedure Laterality Date  . ABDOMINAL AORTOGRAM W/LOWER EXTREMITY N/A 03/12/2017   Procedure: ABDOMINAL AORTOGRAM W/LOWER EXTREMITY;  Surgeon: Conrad [place], MD;  Location: [place] [facility];  Service: Cardiovascular;  Laterality: N/A;  . AMPUTATION Right 01/28/2013   Procedure: RAY AMPUTATION RIGHT  4th & 5th TOE;  Surgeon: Rosetta Posner, MD;  Location: [place];  Service: Vascular;  Laterality: Right;  . AMPUTATION Left 03/15/2017   Procedure: AMPUTATION ABOVE KNEE;  Surgeon: Waynetta Sandy, MD;  Location: [place];  Service: Vascular;  Laterality: Left;  . ANGIOPLASTY / STENTING FEMORAL Right 01/13/2013   superficial femoral artery x 2 (3 mm x 60 mm, 4 mm  x 60 mm)  . AV FISTULA PLACEMENT Left 02/11/2016   Procedure: LEFT UPPER ARM BRACHIAL CEPHALIC ARTERIOVENOUS (AV) FISTULA CREATION;  Surgeon: Conrad [place], MD;  Location: [place];  Service: Vascular;  Laterality: Left;  . BIOPSY  03/29/2018   Procedure: BIOPSY;  Surgeon: Rush Landmark Telford Nab., MD;  Location: [place];  Service: Gastroenterology;;  . BIOPSY  05/31/2018   Procedure: BIOPSY;  Surgeon: Irving Copas., MD;  Location: [place];  Service: Gastroenterology;;  . COLONOSCOPY W/ POLYPECTOMY    . CORONARY ARTERY BYPASS GRAFT N/A 04/07/2013   Procedure: CORONARY ARTERY BYPASS GRAFTING (CABG);  Surgeon: Ivin Poot, MD;  Location: [place];  Service: Open Heart Surgery;  Laterality: N/A;  . ESOPHAGOGASTRODUODENOSCOPY (EGD) WITH PROPOFOL N/A 03/29/2018   Procedure: ESOPHAGOGASTRODUODENOSCOPY (EGD) WITH PROPOFOL;  Surgeon: Rush Landmark Telford Nab., MD;  Location: [place];  Service: Gastroenterology;  Laterality: N/A;  . ESOPHAGOGASTRODUODENOSCOPY (EGD) WITH PROPOFOL N/A 05/31/2018   Procedure: ESOPHAGOGASTRODUODENOSCOPY (EGD) WITH PROPOFOL;  Surgeon: Rush Landmark Telford Nab., MD;  Location: [place];  Service: Gastroenterology;  Laterality: N/A;  . EUS N/A 05/31/2018   Procedure: UPPER ENDOSCOPIC ULTRASOUND (EUS) RADIAL;  Surgeon: Rush Landmark Telford Nab., MD;  Location: [place];  Service: Gastroenterology;  Laterality: N/A;  . INTRAOPERATIVE TRANSESOPHAGEAL ECHOCARDIOGRAM N/A 04/07/2013   Procedure: INTRAOPERATIVE TRANSESOPHAGEAL ECHOCARDIOGRAM;  Surgeon: Ivin Poot, MD;  Location: [place];  Service: Open Heart Surgery;  Laterality: N/A;  . IR FLUORO GUIDE CV LINE RIGHT  11/04/2017  . IR REMOVAL TUN CV CATH W/O FL  11/27/2017  . IR US GUIDE VASC ACCESS RIGHT  11/04/2017  . LEFT HEART CATHETERIZATION WITH CORONARY ANGIOGRAM N/A 04/05/2013   Procedure: LEFT HEART CATHETERIZATION WITH CORONARY ANGIOGRAM;  Surgeon: Peter M Martinique, MD;  Location: [facility] CATH LAB;  Service: Cardiovascular;  Laterality: N/A;  . LOWER EXTREMITY ANGIOGRAM Bilateral 01/06/2013   Procedure: LOWER EXTREMITY ANGIOGRAM;  Surgeon: Conrad [place], MD;  Location: [facility] CATH LAB;  Service: Cardiovascular;  Laterality: Bilateral;  . LOWER EXTREMITY ANGIOGRAM Right 01/13/2013   Procedure: LOWER EXTREMITY ANGIOGRAM;  Surgeon: Conrad [place], MD;  Location: [facility] C[facility] LAB;  Service: Cardiovascular;  Laterality: Right;  . SKIN CANCER EXCISION  2017  . THROAT SURGERY  1983   polyps  . TONSILLECTOMY AND ADENOIDECTOMY  ~ 1965     Allergies  Allergies  Allergen Reactions  . Penicillins Other (See Comments)    Has patient had a PCN reaction causing immediate rash, facial/tongue/throat swelling, SOB or lightheadedness with hypotension: Unknown Has patient had a PCN reaction causing severe rash involving mucus membranes or skin necrosis: Unknown Has patient had a PCN reaction that required hospitalization: Unknown Has patient had a PCN reaction occurring within the last 10 years: Unknown If all of the above answers are "NO", then may proceed with Cephalosporin use.     Inpatient Medications    . amLODipine  10 mg Oral Daily   . atorvastatin  40 mg Oral q1800  . carvedilol  12.5 mg Oral BID WC  . ferrous sulfate  325 mg Oral Q breakfast  . hydrALAZINE  100 mg Oral Q8H  . insulin aspart  0-9 Units Subcutaneous TID WC  . isosorbide mononitrate  120 mg Oral Daily  . levothyroxine  100 mcg Oral QAC breakfast  . pantoprazole (PROTONIX) IV  40 mg Intravenous Q12H  . sodium chloride flush  3 mL Intravenous Q12H  . torsemide  40 mg Oral Daily    Family History  Family History  Problem Relation Age of Onset  . Cancer Mother        lung cancer  . Hypertension Mother   . Cancer Sister        patient thinks it was uterine cancer  . Hypertension Sister   . Heart attack Sister   . Stroke Neg Hx    He indicated that his mother is deceased. He indicated that his father is deceased. He indicated that only one of his two sisters is alive. He indicated that the status of his neg hx is unknown.   Social History    Social History   Socioeconomic History  . Marital status: Single    Spouse name: Not on file  . Number of children: 0  . Years of education: Not on file  . Highest education level: Not on file  Occupational History  . Not on file  Social Needs  . Financial resource strain: Not on file  . Food insecurity:    Worry: Not on file    Inability: Not on file  . Transportation needs:    Medical: No    Non-medical: No  Tobacco Use  . Smoking status: Former Smoker    Packs/day: 1.00    Years: 46.00    Pack years: 46.00    Types: Cigarettes    Start date: 02/04/1967    Last attempt to quit: 02/03/2013    Years since quitting: 5.3  . Smokeless tobacco: Never Used  Substance and Sexual Activity  . Alcohol use: No    Alcohol/week: 0.0 standard drinks  . Drug use: No  . Sexual activity: Not Currently  Lifestyle  . Physical activity:    Days per week: Not on file    Minutes per session: Not on file  . Stress: Not on file  Relationships  . Social connections:    Talks on phone: Not on file     Gets together: Not on file    Attends religious service: Not on file    Active member of club or organization: Not on file    Attends meetings of clubs or organizations: Not on file    Relationship status: Not on file  . Intimate partner violence:    Fear of current or ex partner: Not on file    Emotionally abused: Not on file    Physically abused: Not on file    Forced sexual activity: Not on file  Other Topics Concern  . Not on file  Social History Narrative   Retired - former Sports coach.   Patient in skilled nursing facility.     Review of Systems    All systems reviewed and negative except as per HPI.   Physical Exam    Blood pressure (!) 123/105, pulse 67, temperature 98.2 F (36.8 C), temperature source Oral, resp. rate 18, weight 79.5 kg, SpO2 100 %.   Physical Exam per MD.  Labs    Troponin [facility] Test) No results for input(s): TROPIPOC in the last 72 hours. Recent Labs    06/23/18 2039  TROPONINI <0.03   Lab Results  Component Value Date   WBC 6.3 06/24/2018   HGB 6.1 (LL) 06/24/2018   HCT 19.0 (L) 06/24/2018   MCV 90.9 06/24/2018   PLT 122 (L) 06/24/2018    Recent Labs  Lab 06/24/18 0844  NA 142  K 4.0  CL 111  CO2 20*  BUN 67*  CREATININE 3.40*  CALCIUM 8.3*  PROT  5.2*  BILITOT 0.8  ALKPHOS 93  ALT 8  AST 9*  GLUCOSE 206*   Lab Results  Component Value Date   CHOL 95 02/16/2018   HDL 23 (L) 02/16/2018   LDLCALC 37 02/16/2018   TRIG 173 (H) 02/16/2018   No results found for: [facility]   Radiology Studies    Dg Chest Portable 1 View  Result Date: 06/23/2018 CLINICAL DATA:  Weakness vomiting EXAM: PORTABLE CHEST 1 VIEW COMPARISON:  03/28/2018, PET-CT 04/21/2018 FINDINGS: Possible hazy infiltrate at the left base. Mild cardiomegaly. No pleural effusion or pneumothorax. IMPRESSION: Cardiomegaly.  Possible mild hazy infiltrate at the left base. Electronically Signed   By: Donavan Foil M.D.   On: 06/23/2018 21:27    EKG      EKG: EKG was personally reviewed and demonstrates: NSR, normal.  Telemetry: Telemetry was personally reviewed and demonstrates: NSR 70s.   Cardiac Imaging    Echocardiogram 09/24/2017: Study Conclusions: - Left ventricle: Abnormal global LV strain. The cavity size was   normal. Wall thickness was increased in a pattern of moderate to   severe LVH. Systolic function was normal. The estimated ejection   fraction was in the range of 50% to 55%. Wall motion was normal;   there were no regional wall motion abnormalities. - Aortic root: The aortic root was mildly dilated. - Mitral valve: Mildly calcified annulus. There was mild   regurgitation. - Left atrium: The atrium was moderately dilated. _______________  Leonard [name] 09/24/2017:  There was no ST segment deviation noted during stress.  This is an intermediate risk study.  The left ventricular ejection fraction is mildly decreased (45-54%).   Normal perfusion EF estimated at 45% with moderate LVE diffuse hypokinesis worse in the anterior and apical walls  Assessment & Plan    Pre-Operative Evaluation Patient admitted with acute anemia secondary to chronic GI bleed and hemoglobin of 4. Patient was started on IV Protonix and has received 3 units of PRBCs so far. Repeat hemoglobin this morning 6.1. GI and General Surgery are following. Patient has known gastric adenocarcinoma and is followed by [name]. There have been discussion of possible gastrectomy in the past. Dr. Aundra Dubin saw patient via virtual visit on 06/11/2018 for pre-operative evaluation. Per his note at that time: "Patient is at high risk for complications from gastric surgery. However, surgery would be life-saving with gastric adenocarcinoma. As above, given poor mobility, CAD, and PAD, I think that risk stratification via Lexiscan Cardiolite would be reasonable (will arrange). If low risk or normal, I think that he could proceed with surgery from a cardiac perspective.  However, he also has severe COPD and CKD stage IV, both of which will increase his overall risk for surgery." Lexiscan was ordered but has not been performed yet. Cardiology consulted to help assist with this. Will order Lexiscan for tomorrow. Patient already NPO.  Chronic Diastolic CHF - Most recent Echo from 09/2017 showed LVEF of 50-55% with moderate to severe LVH but no regional wall motion. abnormalities.  - BNP 261.3 in the ED. - Continue home Torsemide 40mg  daily. May need additional diuresis due to blood transfusion but will defer to MD who will assess volume status.  - Continue to closely monitor volume status and renal function.  CAD s/p CABG - Last Myoview from 09/2017 showed normal perfusion. - Troponin negative in the ED. - Will repeat Myoview as part of pre-op evaluation as stated above. - Continue statin and beta blocker. Aspirin currently held due to acute  GI bleed.  Pulmonary Hypertension - PA peak pressure of 63 mmHg on Echo in 07/2017. Felt to be primarily WHO Group III and WHO group II pulmonary hypertension.  COPD  - Management per primary team.   Hypertension - Systolic BP ranging from the 120's to 150's. Most recent BP 123/105. - Continue current medications.  Acute Anemia Secondary to GI Bleed / Gastric Adenocarcinoma - Patient presented with melena and blood emesis. Hemoglobin 4 on admission. Patient started on IV Protonix and has received 3 units of PRBCs so far (last unit was this mroning). Repeat 6.1 this morning. - GI and Surgery following. Plan is for possible gastrectomy due to continued bleeding and severe anemia.  CKD Stage IV - Serum creatinine 3.50 on admission and 3.40 today. - Continue to monitor closely. - Follows with outpatient Nephrology (Dr. Marval Regal).  Otherwise, per primary team.   Signed, Darreld Paiten Boies, PA-C 06/24/2018, 10:43 AM Pager: [phone] For questions or updates, please contact   Please consult [email or web address] for  contact info under Cardiology/STEMI.  Patient seen with PA, agree with the above note.    He was admitted with upper GI bleed from gastric adenocarcinoma, hgb was initially 4 but now up to 7.1.    I saw him in the office recently as pre-operative evaluation for possible distal gastrectomy.  I had planned a Lexiscan Cardiolite for risk stratification (he is not very mobile due to prior amputation and no prosthetic).  However, he was admitted before this was done. Creatinine 3.4, mildly higher than baseline. No chest pain.  Mild dyspnea when he wheels his wheelchair down the hall of his SNF.   General: NAD Neck: No JVD, no thyromegaly or thyroid nodule.  Lungs: Clear to auscultation bilaterally with normal respiratory effort. CV: Nondisplaced PMI.  Heart regular S1/S2, no S3/S4, no murmur.  No peripheral edema.  No carotid bruit.   Abdomen: Soft, nontender, no hepatosplenomegaly, no distention.  Skin: Intact without lesions or rashes.  Neurologic: Alert and oriented x 3.  Psych: Normal affect. Extremities: No clubbing or cyanosis. S/p left AKA.  HEENT: Normal.   Upper GI bleeding from gastric adenocarcinoma is being treated by primary service, hgb up to 7.1.   Volume status looks stable on exam, would continue his home torsemide regimen 40 mg daily.   BP appears reasonably well-controlled on his home regimen.   Patient is at high risk for complications from gastric surgery.  However, surgery would be life-saving with gastric adenocarcinoma.  Given poor mobility, CAD, and PAD, I think that risk stratification via Lexiscan Cardiolite would be reasonable (will arrange for tomorrow).  If low risk or normal, I think that he could proceed with surgery from a cardiac perspective.  However, he also has severe COPD and CKD stage IV, both of which will increase his overall risk for surgery.  If he has a high risk Cardiolite, may need to consider chemotherapy/XRT rather than surgery.  Will need to  discuss with surgical service in that situation.   Loralie Champagne 06/24/2018 4:39 PM

## 2018-06-25 ENCOUNTER — Inpatient Hospital Stay (HOSPITAL_COMMUNITY): Payer: Medicare Other

## 2018-06-25 DIAGNOSIS — Z01818 Encounter for other preprocedural examination: Secondary | ICD-10-CM

## 2018-06-25 LAB — CBC
HCT: 21 % — ABNORMAL LOW (ref 39.0–52.0)
Hemoglobin: 7 g/dL — ABNORMAL LOW (ref 13.0–17.0)
MCH: 29.5 pg (ref 26.0–34.0)
MCHC: 33.3 g/dL (ref 30.0–36.0)
MCV: 88.6 fL (ref 80.0–100.0)
Platelets: 125 10*3/uL — ABNORMAL LOW (ref 150–400)
RBC: 2.37 MIL/uL — ABNORMAL LOW (ref 4.22–5.81)
RDW: 16 % — ABNORMAL HIGH (ref 11.5–15.5)
WBC: 5.4 10*3/uL (ref 4.0–10.5)
nRBC: 0 % (ref 0.0–0.2)

## 2018-06-25 LAB — BASIC METABOLIC PANEL
Anion gap: 11 (ref 5–15)
BUN: 60 mg/dL — ABNORMAL HIGH (ref 8–23)
CO2: 22 mmol/L (ref 22–32)
Calcium: 8.5 mg/dL — ABNORMAL LOW (ref 8.9–10.3)
Chloride: 111 mmol/L (ref 98–111)
Creatinine, Ser: 3.46 mg/dL — ABNORMAL HIGH (ref 0.61–1.24)
GFR calc Af Amer: 20 mL/min — ABNORMAL LOW (ref >60)
GFR calc non Af Amer: 17 mL/min — ABNORMAL LOW (ref >60)
Glucose, Bld: 182 mg/dL — ABNORMAL HIGH (ref 70–99)
Potassium: 4 mmol/L (ref 3.5–5.1)
Sodium: 144 mmol/L (ref 135–145)

## 2018-06-25 LAB — NM MYOCAR MULTI W/SPECT W/WALL MOTION / EF
Peak HR: 75 {beats}/min
Rest HR: 62 {beats}/min

## 2018-06-25 LAB — GLUCOSE, CAPILLARY
Glucose-Capillary: 154 mg/dL — ABNORMAL HIGH (ref 70–99)
Glucose-Capillary: 182 mg/dL — ABNORMAL HIGH (ref 70–99)
Glucose-Capillary: 196 mg/dL — ABNORMAL HIGH (ref 70–99)
Glucose-Capillary: 199 mg/dL — ABNORMAL HIGH (ref 70–99)
Glucose-Capillary: 231 mg/dL — ABNORMAL HIGH (ref 70–99)

## 2018-06-25 LAB — PREPARE RBC (CROSSMATCH)

## 2018-06-25 LAB — PREALBUMIN: Prealbumin: 26.9 mg/dL (ref 18–38)

## 2018-06-25 MED ORDER — REGADENOSON 0.4 MG/5ML IV SOLN
INTRAVENOUS | Status: AC
  Filled 2018-06-25: qty 5

## 2018-06-25 MED ORDER — TECHNETIUM TC 99M TETROFOSMIN IV KIT
30.0000 | PACK | Freq: Once | INTRAVENOUS | Status: AC | PRN
  Administered 2018-06-25: 30 via INTRAVENOUS

## 2018-06-25 MED ORDER — TECHNETIUM TC 99M TETROFOSMIN IV KIT
10.0000 | PACK | Freq: Once | INTRAVENOUS | Status: AC | PRN
  Administered 2018-06-25: 10 via INTRAVENOUS

## 2018-06-25 MED ORDER — REGADENOSON 0.4 MG/5ML IV SOLN
0.4000 mg | Freq: Once | INTRAVENOUS | Status: AC
  Administered 2018-06-25: 0.4 mg via INTRAVENOUS

## 2018-06-25 MED ORDER — UMECLIDINIUM BROMIDE 62.5 MCG/INH IN AEPB
1.0000 | INHALATION_SPRAY | Freq: Every day | RESPIRATORY_TRACT | Status: DC
  Administered 2018-06-25 – 2018-06-27 (×3): 1 via RESPIRATORY_TRACT
  Filled 2018-06-25: qty 7

## 2018-06-25 MED ORDER — TIOTROPIUM BROMIDE MONOHYDRATE 18 MCG IN CAPS: 18.0000 ug | ORAL_CAPSULE | Freq: Every day | RESPIRATORY_TRACT | Status: DC

## 2018-06-25 NOTE — Progress Notes (Signed)
Physical Therapy Treatment Patient Details Name: Leonard [name] MRN: [id number] DOB: [birth year] Today's Date: 06/25/2018    History of Present Illness Mr. Pietrzak is a 68 year old male with PMH of CKD IV, gastric adneocarcinoma, hypothyroidism,  CAD s/p CABG, and PVD s/p left AKA presented to the hospital with acute anemia and gastric adenocarcinoma.     PT Comments    Patient with improved mobility today, practiced sit to stands with +1 assist, at this time moderate. Will cont to follow.     Follow Up Recommendations  SNF;Supervision/Assistance - 24 hour     Equipment Recommendations  (TBD next venue)    Recommendations for Other Services       Precautions / Restrictions Restrictions Weight Bearing Restrictions: No    Mobility  Bed Mobility Overal bed mobility: Needs Assistance Bed Mobility: Supine to Sit     Supine to sit: Min assist;+2 for physical assistance     General bed mobility comments: Pt required 2 hand held assist to power up to sitting at EOB.   Transfers Overall transfer level: Needs assistance Equipment used: Rolling walker (2 wheeled) Transfers: Sit to/from Stand Sit to Stand: Mod assist         General transfer comment: stand<>sit with mod A of 1 x3 times this visit   Ambulation/Gait             General Gait Details: deferred as patient became drowsy, returned to bed, vitals checked WNL BP and HR, SpO2 84% on RA   Stairs             Wheelchair Mobility    Modified Rankin (Stroke Patients Only)       Balance Overall balance assessment: Needs assistance Sitting-balance support: Bilateral upper extremity supported Sitting balance-Leahy Scale: Good     Standing balance support: Bilateral upper extremity supported Standing balance-Leahy Scale: Poor                              Cognition Arousal/Alertness: Awake/alert Behavior During Therapy: WFL for tasks assessed/performed Overall Cognitive Status:  Within Functional Limits for tasks assessed                                        Exercises      General Comments        Pertinent Vitals/Pain      Home Living                      Prior Function            PT Goals (current goals can now be found in the care plan section) Acute Rehab PT Goals Patient Stated Goal: To regain strength PT Goal Formulation: With patient Potential to Achieve Goals: Fair Progress towards PT goals: Progressing toward goals    Frequency    Min 3X/week      PT Plan      Co-evaluation PT/OT/SLP Co-Evaluation/Treatment: Yes            AM-PAC PT "6 Clicks" Mobility   Outcome Measure  Help needed turning from your back to your side while in a flat bed without using bedrails?: A Little Help needed moving from lying on your back to sitting on the side of a flat bed without using bedrails?: A Little Help needed moving to and  from a bed to a chair (including a wheelchair)?: A Lot Help needed standing up from a chair using your arms (e.g., wheelchair or bedside chair)?: A Lot Help needed to walk in hospital room?: A Lot Help needed climbing 3-5 steps with a railing? : Total 6 Click Score: 13    End of Session Equipment Utilized During Treatment: Gait belt Activity Tolerance: Patient tolerated treatment well Patient left: in bed Nurse Communication: Mobility status PT Visit Diagnosis: Unsteadiness on feet (R26.81)     Time: 6893-4068 PT Time Calculation (min) (ACUTE ONLY): 15 min  Charges:  $Therapeutic Activity: 8-22 mins                     Reinaldo Berber, PT, DPT Acute Rehabilitation Services Pager: [phone] Office: [place] 06/25/2018, 5:05 PM

## 2018-06-25 NOTE — Progress Notes (Signed)
[facility] Progress Note     Subjective: CC: no complaints Patient sleeping comfortably but awoke when I entered the room. Reports he is tired today. Denies abdominal pain, nausea or vomiting. Reports he is passing flatus and having bowel function.   Objective: Vital signs in last 24 hours: Temp:  [97.9 F (36.6 C)-98.6 F (37 C)] 97.9 F (36.6 C) (05/22 1106) Pulse Rate:  [63-70] 65 (05/22 1106) Resp:  [18-20] 18 (05/22 1106) BP: (116-149)/(50-71) 133/50 (05/22 1106) SpO2:  [98 %-99 %] 99 % (05/22 1106) Weight:  [79.5 kg] 79.5 kg (05/22 0500) Last BM Date: 06/25/18  Intake/Output from previous day: 05/21 0701 - 05/22 0700 In: 442 [Blood:442] Out: 9935 [Urine:2450] Intake/Output this shift: No intake/output data recorded.  PE: Gen:  Alert, NAD, pleasant Card:  Regular rate and rhythm Pulm:  Normal effort, clear to auscultation bilaterally Abd: Soft, non-tender, non-distended, +BS Skin: warm and dry, no rashes    Lab Results:  Recent Labs    06/24/18 0844 06/24/18 1415 06/25/18 0438  WBC 6.3  --  5.4  HGB 6.1* 7.1* 7.0*  HCT 19.0* 21.4* 21.0*  PLT 122*  --  125*   BMET Recent Labs    06/24/18 0844 06/25/18 0438  NA 142 144  K 4.0 4.0  CL 111 111  CO2 20* 22  GLUCOSE 206* 182*  BUN 67* 60*  CREATININE 3.40* 3.46*  CALCIUM 8.3* 8.5*   PT/INR Recent Labs    06/23/18 2039 06/24/18 0844  LABPROT 14.5 14.3  INR 1.1 1.1   CMP     Component Value Date/Time   NA 144 06/25/2018 0438   NA 139 02/11/2017 1220   NA 140 08/16/2015 1554   K 4.0 06/25/2018 0438   K 3.8 08/16/2015 1554   CL 111 06/25/2018 0438   CO2 22 06/25/2018 0438   CO2 21 (L) 08/16/2015 1554   GLUCOSE 182 (H) 06/25/2018 0438   GLUCOSE 133 08/16/2015 1554   BUN 60 (H) 06/25/2018 0438   BUN 54 (H) 02/11/2017 1220   BUN 51.0 (H) 08/16/2015 1554   CREATININE 3.46 (H) 06/25/2018 0438   CREATININE 3.64 (H) 01/09/2016 1606   CREATININE 2.7 (H) 08/16/2015 1554   CALCIUM  8.5 (L) 06/25/2018 0438   CALCIUM 8.4 (L) 05/10/2018 1050   CALCIUM 8.9 08/16/2015 1554   PROT 5.2 (L) 06/24/2018 0844   PROT 6.6 01/16/2017 1339   PROT 7.0 08/16/2015 1554   ALBUMIN 2.7 (L) 06/24/2018 0844   ALBUMIN 3.4 (L) 08/16/2015 1554   AST 9 (L) 06/24/2018 0844   AST 17 08/16/2015 1554   ALT 8 06/24/2018 0844   ALT 13 08/16/2015 1554   ALKPHOS 93 06/24/2018 0844   ALKPHOS 89 08/16/2015 1554   BILITOT 0.8 06/24/2018 0844   BILITOT 0.30 08/16/2015 1554   GFRNONAA 17 (L) 06/25/2018 0438   GFRNONAA 27 (L) 07/20/2015 0949   GFRAA 20 (L) 06/25/2018 0438   GFRAA 31 (L) 07/20/2015 0949   Lipase     Component Value Date/Time   LIPASE 39 08/06/2015 1730       Studies/Results: Nm Myocar Multi W/spect W/wall Motion / Ef  Result Date: 06/25/2018  There was no ST segment deviation noted during stress.  Nuclear stress EF: 55%. The left ventricular ejection fraction is normal (55-65%).  This is a low risk study. There is no evidence of ischemia and no evidence of previous infarction  The study is normal.    Dg Chest Portable 1  View  Result Date: 06/23/2018 CLINICAL DATA:  Weakness vomiting EXAM: PORTABLE CHEST 1 VIEW COMPARISON:  03/28/2018, PET-CT 04/21/2018 FINDINGS: Possible hazy infiltrate at the left base. Mild cardiomegaly. No pleural effusion or pneumothorax. IMPRESSION: Cardiomegaly.  Possible mild hazy infiltrate at the left base. Electronically Signed   By: Donavan Foil M.D.   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 Hypothyroid  UGI bleed likely 2/2 gastric adenocarcinoma - pt is not a good candidate for chemotherapy - pt had visit with cardiology for preoperative clearance on 05/08 (see in HPI above) they recommended [place] - this was done today and it is felt that patient would be low risk for surgery per cardiology, appreciate input - pt likely needs gastrectomy due to continued bleeding and severe anemia - will discuss with attending to figure out timing of surgery   FEN: FLD VTE: SCDs, no chemical VTE in setting of ABL anemia ID: no abx indicated at this time   LOS: 2 days    Brigid Re , [facility] 06/25/2018, 1:09 PM Pager: [phone] Consults: [phone]

## 2018-06-25 NOTE — Progress Notes (Addendum)
Patient ID: Leonard [name], male   DOB: 07-18-1950, 68 y.o.   MRN: [id number]  Stable this morning, hgb 7 and creatinine 3.46.  Awaiting Cardiolite result.   Loralie Champagne 06/25/2018  Cardiolite reviewed, low risk study with EF 55%.  Somewhat difficult images but no definite ischemia or infarction.  From a cardiac standpoint, he should be of reasonable risk to undergo surgery.  However, also has significant renal and pulmonary issues.    We follow at a distance, call with further questions.   Loralie Champagne 06/25/2018

## 2018-06-25 NOTE — Consult Note (Signed)
   [facility] Inpatient Consult   06/25/2018  Leonard [name] May 30, 1950 [id number]    Patient screened for extreme high risk score[42%] for unplanned readmissionandhospitalizationunder his Medicare NextGen plan, andto check if potential [place] Management services are needed. Patient was engaged by [facility] social worker for community resources support in the past.  Per chart review and MD consult note dated 06/24/18, shows as: Leonard [name] is a 68 y.o. male with a history of CAD s/p CABG x3 in 04/2013, PAD s/p left above knee amputation in 03/4467, chronic diastolic CHF, COPD on home O2, CKD stage IV, hypertension, hyperlipidemia, type 2 diabetes mellitus, and recent diagnosis gastric adenocarcinoma in 03/2018. Patient was admitted from SNF for further management,  after presenting with 3 episodes of black tarry stools as well as bloody emesis and being found to have acute anemia with hemoglobin of 4.0, transfused with 3 unit of PRBCs so far. GI and General Surgery following, Cardiology consulted for pre-operative evaluation for possible gastrectomy. (acute anemia secondary to chronic GI bleed)     Primary Care Provider isDr. Maia Breslow with [facility], listed as providing transition of care.  Current review of dispositionperPT/ OT recommendation shows that patient will likely transition back to SNF (skilled nursing facility). Patient is from [facility].   If there are changes in disposition or needs for community follow-up, please place a [facility] [facility] Management consult as appropriate.   Of note, [facility] Care Management services does not replace or interfere with any services that are arranged by transition of care case management or social work.    For questions and additional information, please call:  Lillionna Nabi A. Harmony Sandell, BSN, RN-BC [facility] Liaison Cell: [phone]

## 2018-06-25 NOTE — Progress Notes (Signed)
    Patient presented for Lexiscan nuclear stress test. Tolerated procedure well. Pending final stress imaging result.  Daune Perch, AGNP-C 06/25/2018  10:12 AM Pager: [phone]

## 2018-06-25 NOTE — Progress Notes (Addendum)
FPTS Interim Progress Note  S: Patient doing well after Lexiscan this AM. Denies any concerns or complaints. Denies any CP or SOB. Endorses being hungry.   O: BP (!) 133/50 (BP Location: Right Arm)   Pulse 65   Temp 97.9 F (36.6 C) (Oral)   Resp 18   Wt 79.5 kg   SpO2 99%   BMI 26.65 kg/m   General: Pleasant older African-American gentleman, well nourished, well developed, in no acute distress with non-toxic appearance, comfortably in bed HEENT: normocephalic, atraumatic Neck: supple, no apparent JVD CV: regular rate and rhythm without murmurs, rubs, or gallops, no lower extremity edema, 2+ radial and pedal pulses bilaterally Lungs: clear to auscultation bilaterally with normal work of breathing room air Abdomen: soft, non-tender, non-distended, normoactive bowel sounds Skin: warm, dry Extremities: warm and well perfused Neuro: Alert and oriented, speech normal   A/P: - hold Torsemide given worsening creatinine and appears euvolemic on exam - follow up lexiscan results and cardiology recs - follow up gen-surg recs - Hgb at 7 this AM. Patient to get 1U pRBC now given he is at threshold and has active bleed. Follow up post transfusion H/H - monitor fluid status, I/O's after blood transfusion given Torsemide held  Danna Hefty, DO 06/25/2018, 12:02 PM PGY-1, [place] Medicine Service pager [phone]

## 2018-06-25 NOTE — Clinical Social Work Note (Signed)
Patient from [facility] following patient progress. CSW will continue to follow, provide SW intervention services as needed and facilitate discharge back to skilled facility once medically stable.  Elio Haden Givens, MSW, LCSW Licensed Clinical Social Worker [place] [phone]

## 2018-06-25 NOTE — Progress Notes (Signed)
Lexiscan complete. Patient denies any complaints. Patient taken back to [facility] to complete testing.

## 2018-06-25 NOTE — Progress Notes (Signed)
Daily Rounding Note  06/25/2018, 11:21 AM  LOS: 2 days   SUBJECTIVE:   Chief complaint: GI bleeding inpatient with gastric adenocarcinoma.  Blood loss anemia.      Nausea, denies abdominal pain.  No BMs.  He is hungry because he has been n.p.o. for more than 24 hours.  OBJECTIVE:         Vital signs in last 24 hours:    Temp:  [97.9 F (36.6 C)-98.6 F (37 C)] 97.9 F (36.6 C) (05/22 1106) Pulse Rate:  [63-70] 65 (05/22 1106) Resp:  [18-20] 18 (05/22 1106) BP: (116-154)/(50-71) 133/50 (05/22 1106) SpO2:  [98 %-100 %] 99 % (05/22 1106) Weight:  [79.5 kg] 79.5 kg (05/22 0500) Last BM Date: 06/24/18 Filed Weights   06/24/18 0105 06/24/18 2056 06/25/18 0500  Weight: 79.5 kg 79.5 kg 79.5 kg   General: Comfortable, NAD. Spoke with patient and observed him but did not physically examine him. Chest: No dyspnea.  No cough. Neuro/Psych: Hard of hearing but fully oriented.  No gross deficits.  Calm, pleasant.  Intake/Output from previous day: 05/21 0701 - 05/22 0700 In: 442 [Blood:442] Out: 9476 [Urine:2450]  Intake/Output this shift: No intake/output data recorded.  Lab Results: Recent Labs    06/23/18 2039 06/24/18 0844 06/24/18 1415 06/25/18 0438  WBC 7.1 6.3  --  5.4  HGB 4.0* 6.1* 7.1* 7.0*  HCT 13.2* 19.0* 21.4* 21.0*  PLT 124* 122*  --  125*   BMET Recent Labs    06/23/18 2039 06/24/18 0844 06/25/18 0438  NA 139 142 144  K 4.5 4.0 4.0  CL 109 111 111  CO2 20* 20* 22  GLUCOSE 234* 206* 182*  BUN 74* 67* 60*  CREATININE 3.50* 3.40* 3.46*  CALCIUM 8.1* 8.3* 8.5*   LFT Recent Labs    06/23/18 2039 06/24/18 0844  PROT 5.2* 5.2*  ALBUMIN 2.7* 2.7*  AST 10* 9*  ALT 8 8  ALKPHOS 96 93  BILITOT 0.5 0.8   PT/INR Recent Labs    06/23/18 2039 06/24/18 0844  LABPROT 14.5 14.3  INR 1.1 1.1   Hepatitis Panel No results for input(s): HEPBSAG, HCVAB, HEPAIGM, HEPBIGM in the last 72 hours.  Studies/Results: Dg Chest Portable 1 View  Result Date: 06/23/2018 CLINICAL DATA:  Weakness vomiting EXAM: PORTABLE CHEST 1 VIEW COMPARISON:  03/28/2018, PET-CT 04/21/2018 FINDINGS: Possible hazy infiltrate at the left base. Mild cardiomegaly. No pleural effusion or pneumothorax. IMPRESSION: Cardiomegaly.  Possible mild hazy infiltrate at the left base. Electronically Signed   By: Donavan Foil M.D.   On: 06/23/2018 21:27    ASSESMENT:   *   Adenocarcinoma of the stomach. Awaiting completion of Cardiolite and cardiac clearance for surgery  *    GI bleeding due to stomach cancer.  *    Blood loss anemia. Hgb 4 >> 3 PRBCs >> 7.1 >> 7.     *    Thrombocytopenia, noncritical.  *    CKD stage IV, AKI   PLAN   *   Would probably go ahead and transfuse another PRBC given the likelihood that with GI bleeding he is going to drop below 7 soon and hopefully will be able to undergo surgery.  *    Allow full liquids.  I spoke with surgical PA and even if the Cardiolite is favorable for surgery.  He would not undergo surgery until at least tomorrow.    Leonard [name]  06/25/2018, 11:21  AM Phone [phone]

## 2018-06-26 LAB — CBC
HCT: 25.2 % — ABNORMAL LOW (ref 39.0–52.0)
Hemoglobin: 8.2 g/dL — ABNORMAL LOW (ref 13.0–17.0)
MCH: 28.4 pg (ref 26.0–34.0)
MCHC: 32.5 g/dL (ref 30.0–36.0)
MCV: 87.2 fL (ref 80.0–100.0)
Platelets: 122 K/uL — ABNORMAL LOW (ref 150–400)
RBC: 2.89 MIL/uL — ABNORMAL LOW (ref 4.22–5.81)
RDW: 16.2 % — ABNORMAL HIGH (ref 11.5–15.5)
WBC: 4.4 K/uL (ref 4.0–10.5)
nRBC: 0 % (ref 0.0–0.2)

## 2018-06-26 LAB — APTT: aPTT: 30 seconds (ref 24–36)

## 2018-06-26 LAB — BASIC METABOLIC PANEL
Anion gap: 9 (ref 5–15)
BUN: 52 mg/dL — ABNORMAL HIGH (ref 8–23)
CO2: 20 mmol/L — ABNORMAL LOW (ref 22–32)
Calcium: 8.1 mg/dL — ABNORMAL LOW (ref 8.9–10.3)
Chloride: 109 mmol/L (ref 98–111)
Creatinine, Ser: 3.33 mg/dL — ABNORMAL HIGH (ref 0.61–1.24)
GFR calc Af Amer: 21 mL/min — ABNORMAL LOW (ref >60)
GFR calc non Af Amer: 18 mL/min — ABNORMAL LOW (ref >60)
Glucose, Bld: 195 mg/dL — ABNORMAL HIGH (ref 70–99)
Potassium: 3.7 mmol/L (ref 3.5–5.1)
Sodium: 138 mmol/L (ref 135–145)

## 2018-06-26 LAB — TYPE AND SCREEN
ABO/RH(D): B NEG
Antibody Screen: NEGATIVE
Unit division: 0
Unit division: 0
Unit division: 0
Unit division: 0

## 2018-06-26 LAB — BPAM RBC
Blood Product Expiration Date: 202005282359
Blood Product Expiration Date: 202006062359
Blood Product Expiration Date: 202006182359
Blood Product Expiration Date: 202006182359
ISSUE DATE / TIME: 202005202356
ISSUE DATE / TIME: 202005210239
ISSUE DATE / TIME: 202005210846
ISSUE DATE / TIME: 202005210846
Unit Type and Rh: 1700
Unit Type and Rh: 1700
Unit Type and Rh: 1700
Unit Type and Rh: 1700

## 2018-06-26 LAB — GLUCOSE, CAPILLARY
Glucose-Capillary: 162 mg/dL — ABNORMAL HIGH (ref 70–99)
Glucose-Capillary: 158 mg/dL — ABNORMAL HIGH (ref 70–99)
Glucose-Capillary: 178 mg/dL — ABNORMAL HIGH (ref 70–99)

## 2018-06-26 LAB — PROTIME-INR
INR: 1.1 (ref 0.8–1.2)
Prothrombin Time: 13.9 s (ref 11.4–15.2)

## 2018-06-26 MED ORDER — PANTOPRAZOLE SODIUM 40 MG PO TBEC
40.0000 mg | DELAYED_RELEASE_TABLET | Freq: Two times a day (BID) | ORAL | Status: DC
  Administered 2018-06-26 – 2018-06-27 (×3): 40 mg via ORAL
  Filled 2018-06-26 (×3): qty 1

## 2018-06-26 NOTE — Progress Notes (Signed)
  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 CAD status post EGBT(06/1759) Hxof diastolic congestive heart failure(09/2017) Hx PVD, s/p left AKA-SNF/wheelchair-bound post left AKA 03/15/2017 CKD stage IV/ Left upper arm AVF COPD, Gold stage III Pulmonary hypretension Monoclonal gammopathy of unknown significance Type II diabetes/diabetic retinopathy Hypothyroid  UGI bleed likely 2/2 gastric adenocarcinoma - pt is not a good candidate for chemotherapy - pt had visit with cardiology for preoperative clearance on 05/08 (see in HPI above) they recommendedLexiscan Cardiolite - this was done today and it is felt that patient would be low risk for surgery per cardiology, appreciate input - surg oncology aware of patient. - ok for patient being discharged from hospital and will follow up outpatient if all other treatment complete -continue full liquid/protein supplements if discharged  FEN: FLD VTE: SCDs, no chemical VTE in setting of ABL anemia ID: no abx indicated at this time    LOS: 3 days  Chief Complaint/Subjective: Some pain, tolerating full liquid diet  Objective: Vital signs in last 24 hours: Temp:  [97.6 F (36.4 C)-98.2 F (36.8 C)] 98 F (36.7 C) (05/23 0414) Pulse Rate:  [59-66] 62 (05/23 0414) Resp:  [18] 18 (05/23 0414) BP: (116-149)/(47-63) 144/63 (05/23 0414) SpO2:  [89 %-100 %] 98 % (05/23 0744) Weight:  [81.4 kg] 81.4 kg (05/22 2227) Last BM Date: 06/25/18  Intake/Output from previous day: 05/22 0701 - 05/23 0700 In: 320 [P.O.:320] Out: 1725 [Urine:1725] Intake/Output this shift: No intake/output data recorded.  Lungs: nonlabored  Cardiovascular: reg rate and rhythm  Abd: soft, NT, ND  Extremities: no edema  Neuro: somnolent but answers questions appropriately when woken  Lab Results: CBC  Recent Labs    06/25/18  0438 06/26/18 0517  WBC 5.4 4.4  HGB 7.0* 8.2*  HCT 21.0* 25.2*  PLT 125* 122*   BMET Recent Labs    06/25/18 0438 06/26/18 0517  NA 144 138  K 4.0 3.7  CL 111 109  CO2 22 20*  GLUCOSE 182* 195*  BUN 60* 52*  CREATININE 3.46* 3.33*  CALCIUM 8.5* 8.1*   PT/INR Recent Labs    06/24/18 0844 06/26/18 0517  LABPROT 14.3 13.9  INR 1.1 1.1   ABG No results for input(s): PHART, HCO3 in the last 72 hours.  Invalid input(s): PCO2, PO2  Studies/Results:  Anti-infectives: Anti-infectives (From admission, onward)   None      Medications: Scheduled Meds: . amLODipine  10 mg Oral Daily  . atorvastatin  40 mg Oral q1800  . carvedilol  12.5 mg Oral BID WC  . ferrous sulfate  325 mg Oral Q breakfast  . hydrALAZINE  100 mg Oral Q8H  . insulin aspart  0-9 Units Subcutaneous TID WC  . isosorbide mononitrate  120 mg Oral Daily  . levothyroxine  100 mcg Oral QAC breakfast  . mouth rinse  15 mL Mouth Rinse BID  . pantoprazole  40 mg Oral BID  . sodium chloride flush  3 mL Intravenous Q12H  . umeclidinium bromide  1 puff Inhalation Daily   Continuous Infusions: . sodium chloride     PRN Meds:.sodium chloride, ipratropium-albuterol, ondansetron **OR** ondansetron (ZOFRAN) IV, sodium chloride flush  Mickeal Skinner, MD [facility]

## 2018-06-26 NOTE — Progress Notes (Signed)
Family Medicine Teaching Service Daily Progress Note Intern Pager: [phone]  [name] Medical record number: [id number] Date of birth: [birth year] Age: 68 y.o. Gender: male  Primary Care Provider: Kathrene Alu, MD Consultants: gen surg, GI, cardiology Code Status: full  Pt Overview and Major Events to Date:  5/20: admitted to [facility], 2UpRBC transfusion 5/22: Lexiscan in cardiology  Assessment and Plan: Karen Kinnard Poseyis a 68 y.o.malepresenting with symptomatic anemia 2/2 GI bleed. PMH is significant for gastric adenocarcinoma, HFpEF, recurrent GI bleed, CKD, HTN, hypothyroidism, anemia of chronic disease  Symptomatic anemia 2/2 acute on chronic GI bleed from gastric adenocarcinoma: stable.  One bloody BM yesterday.  Appetite has increased, per nursing staff.   Has received 3 U pRBC.  Hgb this AM 8.2.    - IV protonix 40mg  BID - continue to monitor Hgb and signs of bleeding - continuous cardiac monitoring - zofran, tylenol PRN - vitals q6  Gastric adenocarcinoma:  Recent dx in 03/2018, followed by [facility] with plan to have distal gastrectomy by [name]. Cleared by cardiology s/p yesterday's lexiscan.   - cards consulted, patient cleared for surgery.  - [facility] and general surgery consulted, plan pending cardiac clearance - Per Rad/onc, Dr. Lisbeth Renshaw has offered radiation if not a candidate for resection, [name] for chemo - continue IV PPI  Chronic anemia:  Multifactorial: CKD, chronic disease, recurrent GI bleed. Followed by [name] with Heme/onc. S/p 3U pRBC . - treatment as above for acute GI bleed - continue iron.   HTN: uncontrolled BP this AM 144/63. Home meds: Hydralazine 100mg  TID, Imdur 120mg  QD, Cardura 8mg  QD, Coreg 12.5mg  BID, Norvasc 10mg , and clonidine patch 0.2mg  weekly.  - continue home meds - No PRN Hydral  - hold Cardura given lower diastolic BP - vitals q4  HFpEF-  Echo (06/2017): EF 50-55% with moderate to  severe LVH. Home meds: Torsemide 40mg  QD, KCl 74mEq, and Coreg 12.5mg  BID. BNP on admission 261.3. Wt 81.4 (baseline 77-80kg). UOP 1525 over past 24hrs.  - continue to monitor fluid status - restart home meds - I/O's, daily weights  CAD s/p CABG: stable Home meds: ASA 81mg  QD and Atorvastatin 40mg  QD - continue atorvastatin - hold ASA in setting of GI bleed  Left AKA- amputated on February 2019. Does not have prosthesis for his leg. Uses wheelchair to ambulate.  - PT/OT - recommend SNF   Hypothyroidism: Home meds: Synthroid 185mcg QD - continue home meds  COPD: stable Does not require O2 at home. Home meds: lonhala magnair and duonebs PRN at home. Still smokes about 3 cigarettes a day per most recent cardiology note.  - continue home meds.   CKD-IV- follows with Dr. Marval Regal outpatient. Has a L AV fistula. Cr 3.40>3.46>3.33 (Baseline 2.5-3) - renally dose meds - avoid nephrotoxic agents including NSAIDs and contrast - am BMP  Diabetes Mellitus II : AIC 6.5.  Home meds: Novolog with meals.  - sSSI - CBGs  FEN/GI: NPO pending GI recs PPx: SCDs  Disposition: likely SNF  Subjective:  Pt feels better today.  No abdominal pain.  His appetite is much improved per nursing staff.  No BM overnight but yesterday's BM was tarry with blood.    Objective: Temp:  [97.6 F (36.4 C)-98.6 F (37 C)] 97.6 F (36.4 C) (05/22 2227) Pulse Rate:  [59-70] 64 (05/22 2227) Resp:  [18-19] 18 (05/22 2227) BP: (116-149)/(47-62) 139/47 (05/22 2227) SpO2:  [89 %-100 %] 99 % (05/22 2227)  Weight:  [79.5 kg-81.4 kg] 81.4 kg (05/22 2227) Physical Exam: General: awake, oriented.  No acute distress Cardiovascular: regular rhythm. Normal rate. No murmurs.  Respiratory: LCTAB. No wheezes or crackles.  Abdomen: soft, nontender. Normal bowel sounds.  Extremities: L AKA.  No pedeal edema.   Laboratory: Recent Labs  Lab 06/23/18 2039 06/24/18 0844 06/24/18 1415 06/25/18 0438  WBC  7.1 6.3  --  5.4  HGB 4.0* 6.1* 7.1* 7.0*  HCT 13.2* 19.0* 21.4* 21.0*  PLT 124* 122*  --  125*   Recent Labs  Lab 06/23/18 2039 06/24/18 0844 06/25/18 0438  NA 139 142 144  K 4.5 4.0 4.0  CL 109 111 111  CO2 20* 20* 22  BUN 74* 67* 60*  CREATININE 3.50* 3.40* 3.46*  CALCIUM 8.1* 8.3* 8.5*  PROT 5.2* 5.2*  --   BILITOT 0.5 0.8  --   ALKPHOS 96 93  --   ALT 8 8  --   AST 10* 9*  --   GLUCOSE 234* 206* 182*    Benay Pike, MD 06/26/2018, 3:51 AM PGY-1, [facility] Intern pager: [phone], text pages welcome

## 2018-06-27 ENCOUNTER — Encounter (HOSPITAL_COMMUNITY): Payer: Self-pay | Admitting: *Deleted

## 2018-06-27 DIAGNOSIS — C163 Malignant neoplasm of pyloric antrum: Secondary | ICD-10-CM | POA: Diagnosis not present

## 2018-06-27 DIAGNOSIS — M2550 Pain in unspecified joint: Secondary | ICD-10-CM | POA: Diagnosis not present

## 2018-06-27 DIAGNOSIS — K920 Hematemesis: Secondary | ICD-10-CM | POA: Diagnosis not present

## 2018-06-27 DIAGNOSIS — I739 Peripheral vascular disease, unspecified: Secondary | ICD-10-CM

## 2018-06-27 DIAGNOSIS — K2901 Acute gastritis with bleeding: Secondary | ICD-10-CM | POA: Diagnosis present

## 2018-06-27 DIAGNOSIS — I959 Hypotension, unspecified: Secondary | ICD-10-CM | POA: Diagnosis not present

## 2018-06-27 DIAGNOSIS — N179 Acute kidney failure, unspecified: Secondary | ICD-10-CM | POA: Diagnosis present

## 2018-06-27 DIAGNOSIS — Z1159 Encounter for screening for other viral diseases: Secondary | ICD-10-CM | POA: Diagnosis not present

## 2018-06-27 DIAGNOSIS — J9611 Chronic respiratory failure with hypoxia: Secondary | ICD-10-CM | POA: Diagnosis present

## 2018-06-27 DIAGNOSIS — I159 Secondary hypertension, unspecified: Secondary | ICD-10-CM | POA: Diagnosis present

## 2018-06-27 DIAGNOSIS — E1142 Type 2 diabetes mellitus with diabetic polyneuropathy: Secondary | ICD-10-CM | POA: Diagnosis present

## 2018-06-27 DIAGNOSIS — D002 Carcinoma in situ of stomach: Secondary | ICD-10-CM | POA: Diagnosis not present

## 2018-06-27 DIAGNOSIS — Z7189 Other specified counseling: Secondary | ICD-10-CM | POA: Diagnosis not present

## 2018-06-27 DIAGNOSIS — E1165 Type 2 diabetes mellitus with hyperglycemia: Secondary | ICD-10-CM | POA: Diagnosis not present

## 2018-06-27 DIAGNOSIS — R29898 Other symptoms and signs involving the musculoskeletal system: Secondary | ICD-10-CM | POA: Diagnosis not present

## 2018-06-27 DIAGNOSIS — I10 Essential (primary) hypertension: Secondary | ICD-10-CM | POA: Diagnosis not present

## 2018-06-27 DIAGNOSIS — R1111 Vomiting without nausea: Secondary | ICD-10-CM | POA: Diagnosis not present

## 2018-06-27 DIAGNOSIS — D500 Iron deficiency anemia secondary to blood loss (chronic): Secondary | ICD-10-CM | POA: Diagnosis not present

## 2018-06-27 DIAGNOSIS — C801 Malignant (primary) neoplasm, unspecified: Secondary | ICD-10-CM | POA: Diagnosis not present

## 2018-06-27 DIAGNOSIS — E11319 Type 2 diabetes mellitus with unspecified diabetic retinopathy without macular edema: Secondary | ICD-10-CM | POA: Diagnosis present

## 2018-06-27 DIAGNOSIS — D62 Acute posthemorrhagic anemia: Secondary | ICD-10-CM

## 2018-06-27 DIAGNOSIS — D649 Anemia, unspecified: Secondary | ICD-10-CM | POA: Diagnosis present

## 2018-06-27 DIAGNOSIS — R112 Nausea with vomiting, unspecified: Secondary | ICD-10-CM | POA: Diagnosis present

## 2018-06-27 DIAGNOSIS — R531 Weakness: Secondary | ICD-10-CM | POA: Diagnosis not present

## 2018-06-27 DIAGNOSIS — Z515 Encounter for palliative care: Secondary | ICD-10-CM | POA: Diagnosis not present

## 2018-06-27 DIAGNOSIS — D696 Thrombocytopenia, unspecified: Secondary | ICD-10-CM | POA: Diagnosis present

## 2018-06-27 DIAGNOSIS — J449 Chronic obstructive pulmonary disease, unspecified: Secondary | ICD-10-CM | POA: Diagnosis present

## 2018-06-27 DIAGNOSIS — D472 Monoclonal gammopathy: Secondary | ICD-10-CM | POA: Diagnosis present

## 2018-06-27 DIAGNOSIS — I2510 Atherosclerotic heart disease of native coronary artery without angina pectoris: Secondary | ICD-10-CM

## 2018-06-27 DIAGNOSIS — E039 Hypothyroidism, unspecified: Secondary | ICD-10-CM | POA: Diagnosis present

## 2018-06-27 DIAGNOSIS — R58 Hemorrhage, not elsewhere classified: Secondary | ICD-10-CM | POA: Diagnosis not present

## 2018-06-27 DIAGNOSIS — R41841 Cognitive communication deficit: Secondary | ICD-10-CM | POA: Diagnosis present

## 2018-06-27 DIAGNOSIS — E1151 Type 2 diabetes mellitus with diabetic peripheral angiopathy without gangrene: Secondary | ICD-10-CM | POA: Diagnosis present

## 2018-06-27 DIAGNOSIS — E1122 Type 2 diabetes mellitus with diabetic chronic kidney disease: Secondary | ICD-10-CM | POA: Diagnosis present

## 2018-06-27 DIAGNOSIS — K922 Gastrointestinal hemorrhage, unspecified: Secondary | ICD-10-CM | POA: Diagnosis not present

## 2018-06-27 DIAGNOSIS — Z794 Long term (current) use of insulin: Secondary | ICD-10-CM | POA: Diagnosis not present

## 2018-06-27 DIAGNOSIS — C169 Malignant neoplasm of stomach, unspecified: Secondary | ICD-10-CM | POA: Diagnosis present

## 2018-06-27 DIAGNOSIS — K921 Melena: Secondary | ICD-10-CM | POA: Diagnosis not present

## 2018-06-27 DIAGNOSIS — I130 Hypertensive heart and chronic kidney disease with heart failure and stage 1 through stage 4 chronic kidney disease, or unspecified chronic kidney disease: Secondary | ICD-10-CM | POA: Diagnosis present

## 2018-06-27 DIAGNOSIS — Z03818 Encounter for observation for suspected exposure to other biological agents ruled out: Secondary | ICD-10-CM | POA: Diagnosis not present

## 2018-06-27 DIAGNOSIS — Z66 Do not resuscitate: Secondary | ICD-10-CM | POA: Diagnosis not present

## 2018-06-27 DIAGNOSIS — M6281 Muscle weakness (generalized): Secondary | ICD-10-CM | POA: Diagnosis present

## 2018-06-27 DIAGNOSIS — C170 Malignant neoplasm of duodenum: Secondary | ICD-10-CM | POA: Diagnosis present

## 2018-06-27 DIAGNOSIS — N400 Enlarged prostate without lower urinary tract symptoms: Secondary | ICD-10-CM | POA: Diagnosis present

## 2018-06-27 DIAGNOSIS — K2900 Acute gastritis without bleeding: Secondary | ICD-10-CM | POA: Diagnosis not present

## 2018-06-27 DIAGNOSIS — I5032 Chronic diastolic (congestive) heart failure: Secondary | ICD-10-CM | POA: Diagnosis present

## 2018-06-27 DIAGNOSIS — N184 Chronic kidney disease, stage 4 (severe): Secondary | ICD-10-CM | POA: Diagnosis present

## 2018-06-27 DIAGNOSIS — J09X2 Influenza due to identified novel influenza A virus with other respiratory manifestations: Secondary | ICD-10-CM | POA: Diagnosis present

## 2018-06-27 DIAGNOSIS — Z951 Presence of aortocoronary bypass graft: Secondary | ICD-10-CM | POA: Diagnosis not present

## 2018-06-27 DIAGNOSIS — Z7401 Bed confinement status: Secondary | ICD-10-CM | POA: Diagnosis not present

## 2018-06-27 DIAGNOSIS — E782 Mixed hyperlipidemia: Secondary | ICD-10-CM | POA: Diagnosis present

## 2018-06-27 LAB — CBC
HCT: 25.2 % — ABNORMAL LOW (ref 39.0–52.0)
Hemoglobin: 8.1 g/dL — ABNORMAL LOW (ref 13.0–17.0)
MCH: 28.4 pg (ref 26.0–34.0)
MCHC: 32.1 g/dL (ref 30.0–36.0)
MCV: 88.4 fL (ref 80.0–100.0)
Platelets: 124 10*3/uL — ABNORMAL LOW (ref 150–400)
RBC: 2.85 MIL/uL — ABNORMAL LOW (ref 4.22–5.81)
RDW: 16.2 % — ABNORMAL HIGH (ref 11.5–15.5)
WBC: 5.1 10*3/uL (ref 4.0–10.5)
nRBC: 0 % (ref 0.0–0.2)

## 2018-06-27 LAB — GLUCOSE, CAPILLARY
Glucose-Capillary: 279 mg/dL — ABNORMAL HIGH (ref 70–99)
Glucose-Capillary: 146 mg/dL — ABNORMAL HIGH (ref 70–99)
Glucose-Capillary: 169 mg/dL — ABNORMAL HIGH (ref 70–99)
Glucose-Capillary: 169 mg/dL — ABNORMAL HIGH (ref 70–99)

## 2018-06-27 LAB — BASIC METABOLIC PANEL
Anion gap: 10 (ref 5–15)
BUN: 41 mg/dL — ABNORMAL HIGH (ref 8–23)
CO2: 21 mmol/L — ABNORMAL LOW (ref 22–32)
Calcium: 8 mg/dL — ABNORMAL LOW (ref 8.9–10.3)
Chloride: 109 mmol/L (ref 98–111)
Creatinine, Ser: 3.02 mg/dL — ABNORMAL HIGH (ref 0.61–1.24)
GFR calc Af Amer: 24 mL/min — ABNORMAL LOW (ref >60)
GFR calc non Af Amer: 20 mL/min — ABNORMAL LOW (ref >60)
Glucose, Bld: 212 mg/dL — ABNORMAL HIGH (ref 70–99)
Potassium: 4.1 mmol/L (ref 3.5–5.1)
Sodium: 140 mmol/L (ref 135–145)

## 2018-06-27 LAB — SARS CORONAVIRUS 2 BY RT PCR (HOSPITAL ORDER, PERFORMED IN ~~LOC~~ HOSPITAL LAB): SARS Coronavirus 2: NEGATIVE

## 2018-06-27 MED ORDER — UMECLIDINIUM BROMIDE 62.5 MCG/INH IN AEPB: 1.0000 | INHALATION_SPRAY | Freq: Every day | RESPIRATORY_TRACT | 0 refills | Status: AC

## 2018-06-27 NOTE — Progress Notes (Signed)
Report called to Guadalupe Maple .

## 2018-06-27 NOTE — Discharge Summary (Signed)
[facility] Discharge Summary  [name] Medical record number: [id number] Date of birth: 10/20/1950 Age: 68 y.o. Gender: male Date of Admission: 06/23/2018  Date of Discharge: 06/27/2018 Admitting Physician: Martyn Malay, MD  Primary Care Provider: Kathrene Alu, MD Consultants: Cardiology, General Surgery, GI  Indication for Hospitalization: Symptomatic Anemia  Discharge Diagnoses/Problem List:  Symptomatic Anemia Gastric Adenocarcinoma Chronic Anemia HTN CAD s/p CABG Left AKA Hypothyroidism COPD CKD-IV T2DM  Disposition: [facility]  Discharge Condition: Improved  Discharge Exam:  General: pleasant elderly african [place] gentleman, well nourished, well developed, in no acute distress with non-toxic appearance, resting comfortably in bed HEENT: normocephalic, atraumatic Neck: supple CV: regular rate and rhythm without murmurs, rubs, or gallops, no lower extremity edema of RLE, 2+ radial and pedal pulses bilaterally Lungs: clear to auscultation bilaterally with normal work of breathing Abdomen: soft, non-tender, non-distended, normoactive bowel sounds Skin: warm, dry Extremities: warm and well perfused Neuro: Alert and oriented, speech normal  Brief Hospital Course:  FERAS GARDELLA is a 68 y.o. male with past medical history significant for gastric adenocarcinoma, HFpEF, recurrent GI bleed, CKD, HTN, hypothyroidism, anemia of chronic disease, who presented with lethargy, melena, and dark emesis and found to have hemoglobin of 4.0 on admission. His symptomatic anemia was expected to be secondary to his known gastric adenocarcinoma. He was transfused 4 units of pRBC's with improvement in his symptoms. At time of discharge his hemoglobin was stable at 8.1 without any further episodes of bleeding.  Patient had an upper endoscopic ultrasound on 4/27 notable for erosions and stigmata of recent bleeding and malignant gastric  tumor in the prepyloric region of the stomach invading into the duodenal bulb (T2N0). Previous PET-CT without lymphadenopathy finding. Per chart review, patient is not a good candidate for chemotherapy. He was awaiting cardiology clearance for distal gastrectomy by [name] prior to admission.  GI, general surgery, and cardiology was consulted during admission. He had a Lexiscan performed on 5/22 which was found to be low risk with EF of 55%. He had no definite ischemia or infarction. General surgery opted to defer further surgical intervention to his surgical-oncologist [name] as an outpatient given he was currently stable.   On day of discharge, patient was hemodynamically stable with no further signs of active bleeding. He was discharged back to [facility] with instruction to have close follow up with [name]. Return precautions were discussed at length.    Issues for Follow Up:  1. Continue to monitor hemoglobin and any further bleeding 2. Ensure patient follows up with [name] concerning distal gastrectomy  3. Monitor Blood pressures and fluid status  Significant Procedures: Lexiscan, 4 units pRBC transfusion  Significant Labs and Imaging:  Recent Labs  Lab 06/25/18 0438 06/26/18 0517 06/27/18 0809  WBC 5.4 4.4 5.1  HGB 7.0* 8.2* 8.1*  HCT 21.0* 25.2* 25.2*  PLT 125* 122* 124*   Recent Labs  Lab 06/23/18 2039 06/24/18 0844 06/25/18 0438 06/26/18 0517 06/27/18 0809  NA 139 142 144 138 140  K 4.5 4.0 4.0 3.7 4.1  CL 109 111 111 109 109  CO2 20* 20* 22 20* 21*  GLUCOSE 234* 206* 182* 195* 212*  BUN 74* 67* 60* 52* 41*  CREATININE 3.50* 3.40* 3.46* 3.33* 3.02*  CALCIUM 8.1* 8.3* 8.5* 8.1* 8.0*  ALKPHOS 96 93  --   --   --   AST 10* 9*  --   --   --  ALT 8 8  --   --   --   ALBUMIN 2.7* 2.7*  --   --   --    BNP: 261.3 Troponin: <0.03 PT/INR: 14.5/1.1 COVID: negative A1C: 6.5  Nm Myocar Multi W/spect W/wall Motion / Ef  Addendum Date: 06/25/2018    There  was no ST segment deviation noted during stress.  Nuclear stress EF: 55%. The left ventricular ejection fraction is normal (55-65%).  This is a low risk study. The stress images are somewhat difficult to assess due to relatively low counts compared to the resting images . There appears to be diffuse attenuation of the entire LV but particularly in the lateral wal  The lateral wall contracts normally and his EF is normal . The patient is asymptomatic.  I think the patient is at low risk for his upcoming gastric surgery    Result Date: 06/25/2018  There was no ST segment deviation noted during stress.  Nuclear stress EF: 55%. The left ventricular ejection fraction is normal (55-65%).  This is a low risk study. There is no evidence of ischemia and no evidence of previous infarction  The study is normal.    Dg Chest Portable 1 View  Result Date: 06/23/2018 CLINICAL DATA:  Weakness vomiting EXAM: PORTABLE CHEST 1 VIEW COMPARISON:  03/28/2018, PET-CT 04/21/2018 FINDINGS: Possible hazy infiltrate at the left base. Mild cardiomegaly. No pleural effusion or pneumothorax. IMPRESSION: Cardiomegaly.  Possible mild hazy infiltrate at the left base. Electronically Signed   By: Donavan Foil M.D.   On: 06/23/2018 21:27    Results/Tests Pending at Time of Discharge: None  Discharge Medications:  Allergies as of 06/27/2018      Reactions   Penicillins Other (See Comments)   Has patient had a PCN reaction causing immediate rash, facial/tongue/throat swelling, SOB or lightheadedness with hypotension: Unknown Has patient had a PCN reaction causing severe rash involving mucus membranes or skin necrosis: Unknown Has patient had a PCN reaction that required hospitalization: Unknown Has patient had a PCN reaction occurring within the last 10 years: Unknown If all of the above answers are "NO", then may proceed with Cephalosporin use.      Medication List    STOP taking these medications   aspirin EC 81 MG  tablet   Lonhala Magnair Starter Kit 25 MCG/ML Soln Generic drug:  Glycopyrrolate     TAKE these medications   acetaminophen 325 MG tablet Commonly known as:  TYLENOL Take 650 mg by mouth every 8 (eight) hours as needed for mild pain or fever.   amLODipine 10 MG tablet Commonly known as:  NORVASC Take 1 tablet (10 mg total) by mouth daily.   atorvastatin 40 MG tablet Commonly known as:  LIPITOR TAKE ONE (1) TABLET BY MOUTH EVERY DAY AT 6:00PM What changed:  See the new instructions.   carvedilol 12.5 MG tablet Commonly known as:  COREG Take 1 tablet (12.5 mg total) by mouth 2 (two) times daily with a meal.   cholecalciferol 25 MCG (1000 UT) tablet Commonly known as:  VITAMIN D Take 2,000 Units by mouth 3 (three) times daily.   cloNIDine 0.2 mg/24hr patch Commonly known as:  CATAPRES - Dosed in mg/24 hr Place 1 patch (0.2 mg total) onto the skin once a week. What changed:  when to take this   Darbepoetin Alfa 40 MCG/0.4ML Sosy injection Commonly known as:  ARANESP Inject 0.4 mLs (40 mcg total) into the skin every 30 (thirty) days.   doxazosin  8 MG tablet Commonly known as:  CARDURA Take 1 tablet (8 mg total) by mouth every evening.   feeding supplement (ENSURE ENLIVE) Liqd Take 237 mLs by mouth 3 (three) times daily between meals.   ferrous sulfate 325 (65 FE) MG tablet Commonly known as:  FeroSul Take 1 tablet (325 mg total) by mouth daily at 12 noon. What changed:  when to take this   glucose blood test strip Commonly known as:  OneTouch Verio Use as instructed to test three times daily. ICD-10 code: E11.22   hydrALAZINE 100 MG tablet Commonly known as:  APRESOLINE TAKE ONE TABLET BY MOUTH EVERY EIGHT HOURS What changed:  See the new instructions.   insulin aspart 100 UNIT/ML injection Commonly known as:  novoLOG Inject 0-10 Units into the skin See admin instructions. Three times daily per sliding scale: <150 = 0 units 151-200 = 2 units 201-250 = 4  units 251-300 = 6 units 301-350 = 8 units 351-400 = 10 units Call PCP if CBG <70 or >400   ipratropium-albuterol 0.5-2.5 (3) MG/3ML Soln Commonly known as:  DUONEB Take 3 mLs by nebulization every 6 (six) hours as needed (shortness of breath and wheezing).   isosorbide mononitrate 120 MG 24 hr tablet Commonly known as:  IMDUR Take 1 tablet (120 mg total) by mouth daily.   levothyroxine 100 MCG tablet Commonly known as:  SYNTHROID Take 100 mcg by mouth daily before breakfast.   nitroGLYCERIN 0.4 MG SL tablet Commonly known as:  NITROSTAT Place 1 tablet (0.4 mg total) under the tongue every 5 (five) minutes as needed for chest pain.   OneTouch Delica Lancets 65L Misc 1 each by Does not apply route 3 (three) times daily. ICD-10 code: E11.22   OneTouch Verio Flex System w/Device Kit 1 kit by Does not apply route daily.   pantoprazole 40 MG tablet Commonly known as:  PROTONIX Take 1 tablet (40 mg total) by mouth 2 (two) times daily.   polyethylene glycol 17 g packet Commonly known as:  MIRALAX / GLYCOLAX Take 17 g by mouth daily as needed for mild constipation or moderate constipation. What changed:  reasons to take this   potassium chloride SA 20 MEQ tablet Commonly known as:  K-DUR Take 2 tablets (40 mEq total) by mouth daily.   torsemide 20 MG tablet Commonly known as:  DEMADEX Take 2 tablets (40 mg total) by mouth daily.   umeclidinium bromide 62.5 MCG/INH Aepb Commonly known as:  INCRUSE ELLIPTA Inhale 1 puff into the lungs daily. Start taking on:  Jun 28, 2018       Discharge Instructions: Please refer to Patient Instructions section of EMR for full details.  Patient was counseled important signs and symptoms that should prompt return to medical care, changes in medications, dietary instructions, activity restrictions, and follow up appointments.   Follow-Up Appointments: Follow-up Information    Stark Klein, MD. Schedule an appointment as soon as possible  for a visit.   Specialty:  General Surgery Contact information: [address] [place] [address] [phone]           Danna Hefty, DO 06/27/2018, 1:09 PM PGY-1, [place]

## 2018-06-27 NOTE — Progress Notes (Signed)
Patient informed of discharge back to SNF.  IV removed, site intact.  Telemetry discontinued.  Patient educated about discharge.  Patient delighted to return to SNF.  Inez Rosato R

## 2018-06-27 NOTE — TOC Transition Note (Signed)
Transition of Care [facility]) - CM/SW Discharge Note   Patient Details  Name: Leonard [name] MRN: [id number] Date of Birth: 1951-02-02  [facility] CM/SW Contact:  Gelene Mink, [place] Phone Number: 06/27/2018, 2:18 PM   Clinical Narrative:     Patient will DC to: [facility] Anticipated DC date: 06/27/2018 Family notified: Yes Transport by: Corey Harold   Per MD patient ready for DC to . RN, patient, patient's family, and facility notified of DC. Discharge Summary and FL2 sent to facility. RN to call report prior to discharge ([phone]). Patient will report to room 162B.  DC packet on chart. Ambulance transport requested for patient.   CSW will sign off for now as social work intervention is no longer needed. Please consult [place] again if new needs arise.  Josel Keo, LCSW-A [place]/Clinical Social Work Department Cell: [phone]    Final next level of care: [place] Barriers to Discharge: No Barriers Identified, Other (comment)(Awaiting rapid negative COVID screen, then will be able to DC)   Patient Goals and CMS Choice Patient states their goals for this hospitalization and ongoing recovery are:: Pt will return back to [facility] and resume therapy CMS Medicare.gov Compare Post Acute Care list provided to:: Other (Comment Required)(Pt returning back to [facility]) Choice offered to / list presented to : NA  Discharge Placement   Existing PASRR number confirmed : 06/27/18          Patient chooses bed at: Other - please specify in the comment section below:([facility]) Patient to be transferred to facility by: [place] Name of family member notified: [name], sister Patient and family notified of of transfer: 06/27/18  Discharge Plan and Services In-house Referral: Clinical Social Work Discharge Planning Services: NA Post Acute Care Choice: NA          DME Arranged: N/A DME Agency: NA       HH Arranged: NA HH Agency: NA         Social Determinants of Health (SDOH) Interventions     Readmission Risk Interventions No flowsheet data found.

## 2018-06-27 NOTE — NC FL2 (Signed)
[place] MEDICAID FL2 LEVEL OF CARE SCREENING TOOL     IDENTIFICATION  [name] Birthdate: 1950/03/18 Sex: male Admission Date (Current Location): 06/23/2018  [facility] and [place] Number:  Herbalist and Address:  [facility], [place] [address] [place], [place] [address]      Provider Number: [id number]  Attending Physician Name and Address:  Martyn Malay, MD  Relative Name and Phone Number:  Elwin Sleight, Sister, [place], Sister, [phone]    Current Level of Care: Hospital Recommended Level of Care: [place] Prior Approval Number:    Date Approved/Denied: 04/12/13 PASRR Number: [id number]  Discharge Plan: SNF    Current Diagnoses: Patient Active Problem List   Diagnosis Date Noted  . GI bleed 06/23/2018  . Acute renal failure superimposed on chronic kidney disease ([place])   . Abdominal pain   . Adenocarcinoma ([place])   . DNR (do not resuscitate)   . 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[place])   . Palliative care encounter   . Symptomatic anemia 03/29/2018  . Acute GI bleeding 03/28/2018  . Dysphagia 10/01/2017  . Palliative care by specialist   . Severe comorbid illness   . Moderate malnutrition ([place]) 08/27/2017  . Hypoxemia   . Atelectasis   . Chronic respiratory failure with hypoxia ([place]) 08/25/2017  . Pulmonary artery hypertension ([place]) 07/25/2017  . Pressure injury of skin 07/20/2017  . Goals of care, counseling/discussion 02/11/2017  . Diabetic polyneuropathy associated with type 2 diabetes mellitus ([place]) 01/14/2017  . Pleural effusion   . Chronic anemia 02/28/2016  . Diabetic retinopathy ([place]) 11/13/2015  . Iron deficiency anemia 08/03/2015  . Type II diabetes mellitus with complication ([place])   . Chronic kidney disease (CKD), stage III (moderate) (HCC)   . 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 Deficiency anemia 01/15/2015  . CKD  (chronic kidney disease) stage 4, GFR 15-29 ml/min (HCC) 01/15/2015  . Hypothyroidism 11/22/2014  . COPD GOLD III  02/16/2014  . Anemia of renal disease   . Chronic diastolic CHF (congestive heart failure) ([place])   . History of tobacco use 08/23/2013  . 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 CAD (coronary artery disease) 04/06/2013  . PVD - hx of Rt SFA PTA and s/p Rt 4-5th toe amp Dec 2014 04/05/2013  . 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 Mixed hyperlipidemia 07/17/2008    Orientation RESPIRATION BLADDER Height & Weight     Self, Place, Time  Normal Incontinent Weight: 175 lb 7.8 oz (79.6 kg) Height:     BEHAVIORAL SYMPTOMS/MOOD NEUROLOGICAL BOWEL NUTRITION STATUS      Incontinent Diet(All liquid diet, thin liquids)  AMBULATORY STATUS COMMUNICATION OF NEEDS Skin   Limited Assist Verbally Normal, Surgical wounds(toe ampuation)                       Personal Care Assistance Level of Assistance  Bathing, Feeding, Dressing, Total care Bathing Assistance: Limited assistance Feeding assistance: Independent Dressing Assistance: Limited assistance Total Care Assistance: Limited assistance   Functional Limitations Info  Sight, Hearing, Speech Sight Info: Adequate Hearing Info: Impaired Speech Info: Adequate    SPECIAL CARE FACTORS FREQUENCY  PT (By licensed PT), OT (By licensed OT)     PT Frequency: 5x/wk OT Frequency: 5x/wk            Contractures Contractures Info: Not present    Additional Factors Info  Code Status, Allergies,  Insulin Sliding Scale Code Status Info: Full Code Allergies Info: Penicillins   Insulin Sliding Scale Info: insulin aspart novolog 0-9 units 3x daily w/meals       Current Medications (06/27/2018):  This is the current hospital active medication list Current Facility-Administered Medications  Medication Dose Route Frequency Provider Last Rate Last Dose  . 0.9 %  sodium chloride infusion  250 mL Intravenous PRN Benay Pike, MD      .  amLODipine (NORVASC) tablet 10 mg  10 mg Oral Daily Benay Pike, MD   10 mg at 06/27/18 0941  . atorvastatin (LIPITOR) tablet 40 mg  40 mg Oral q1800 Benay Pike, MD   40 mg at 06/26/18 1813  . carvedilol (COREG) tablet 12.5 mg  12.5 mg Oral BID WC Benay Pike, MD   12.5 mg at 06/27/18 0855  . ferrous sulfate tablet 325 mg  325 mg Oral Q breakfast Bufford Lope, DO   325 mg at 06/27/18 0855  . hydrALAZINE (APRESOLINE) tablet 100 mg  100 mg Oral Q8H Benay Pike, MD   100 mg at 06/27/18 9509  . insulin aspart (novoLOG) injection 0-9 Units  0-9 Units Subcutaneous TID WC Benay Pike, MD   1 Units at 06/27/18 [phone]  . ipratropium-albuterol (DUONEB) 0.5-2.5 (3) MG/3ML nebulizer solution 3 mL  3 mL Nebulization Q6H PRN Benay Pike, MD      . isosorbide mononitrate (IMDUR) 24 hr tablet 120 mg  120 mg Oral Daily Benay Pike, MD   120 mg at 06/27/18 0941  . levothyroxine (SYNTHROID) tablet 100 mcg  100 mcg Oral QAC breakfast Benay Pike, MD   100 mcg at 06/27/18 [phone]  . MEDLINE mouth rinse  15 mL Mouth Rinse BID Martyn Malay, MD   15 mL at 06/27/18 0941  . ondansetron (ZOFRAN) tablet 4 mg  4 mg Oral Q6H PRN Benay Pike, MD       Or  . ondansetron [facility]) injection 4 mg  4 mg Intravenous Q6H PRN Benay Pike, MD      . pantoprazole (PROTONIX) EC tablet 40 mg  40 mg Oral BID Meccariello, Bernita Raisin, DO   40 mg at 06/27/18 0941  . sodium chloride flush (NS) 0.9 % injection 3 mL  3 mL Intravenous Q12H Benay Pike, MD   3 mL at 06/27/18 0942  . sodium chloride flush (NS) 0.9 % injection 3 mL  3 mL Intravenous PRN Benay Pike, MD      . umeclidinium bromide (INCRUSE ELLIPTA) 62.5 MCG/INH 1 puff  1 puff Inhalation Daily Martyn Malay, MD   1 puff at 06/27/18 8099   Facility-Administered Medications Ordered in Other Encounters  Medication Dose Route Frequency Provider Last Rate Last Dose  . sodium chloride flush (NS) 0.9 % injection 10 mL  10 mL Intracatheter PRN  Heath Lark, MD         Discharge Medications: Please see discharge summary for a list of discharge medications.  Relevant Imaging Results:  Relevant Lab Results:   Additional Information SSN: [id number]  Philippa Chester Wilmary Levit, LCSWA

## 2018-06-27 NOTE — TOC Initial Note (Signed)
Transition of Care [facility]) - Initial/Assessment Note    Patient Details  Name: Leonard [name] MRN: [id number] Date of Birth: Jul 16, 1950  [facility] CM/SW Contact:    Gelene Mink, [place] Phone Number: 06/27/2018, 10:06 AM  Clinical Narrative:                  CSW met with the patient at his bedside. Patient was alert and oriented. CSW explained her role. CSW asked if the patient would like to return back to [facility]. The patient stated that he would like to return back to [facility] once he is medically ready to leave the hospital. The patient did not have any other questions or concerns.    Expected Discharge Plan: Skilled Nursing Facility Barriers to Discharge: Continued Medical Work up   Patient Goals and CMS Choice Patient states their goals for this hospitalization and ongoing recovery are:: Pt would like to return back to [facility] to complete his rehab CMS Medicare.gov Compare Post Acute Care list provided to:: Other (Comment Required)(Pt will return back to his SNF facility) Choice offered to / list presented to : Patient  Expected Discharge Plan and Services Expected Discharge Plan: [place] In-house Referral: Clinical Social Work Discharge Planning Services: NA Post Acute Care Choice: NA Living arrangements for the past 2 months: [place]                 DME Arranged: N/A DME Agency: NA       HH Arranged: NA [place] Agency: NA        Prior Living Arrangements/Services Living arrangements for the past 2 months: [place] Lives with:: Facility Resident Patient language and need for interpreter reviewed:: Yes Do you feel safe going back to the place where you live?: Yes      Need for Family Participation in Patient Care: Yes (Comment) Care giver support system in place?: Yes (comment)   Criminal Activity/Legal Involvement Pertinent to Current Situation/Hospitalization: No - Comment as  needed  Activities of Daily Living      Permission Sought/Granted Permission sought to share information with : Case Manager Permission granted to share information with : Yes, Verbal Permission Granted     Permission granted to share info w AGENCY: [facility]        Emotional Assessment Appearance:: Appears stated age Attitude/Demeanor/Rapport: Engaged Affect (typically observed): Calm Orientation: : Oriented to Self, Oriented to Place, Oriented to  Time Alcohol / Substance Use: Not Applicable Psych Involvement: No (comment)  Admission diagnosis:  Acute GI bleeding [K92.2] Symptomatic anemia [D64.9] Patient Active Problem List   Diagnosis Date Noted  . GI bleed 06/23/2018  . Acute renal failure superimposed on chronic kidney disease ([place])   . Abdominal pain   . Adenocarcinoma ([place])   . DNR (do not resuscitate)   . 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[place])   . Palliative care encounter   . Symptomatic anemia 03/29/2018  . Acute GI bleeding 03/28/2018  . Dysphagia 10/01/2017  . Palliative care by specialist   . Severe comorbid illness   . Moderate malnutrition ([place]) 08/27/2017  . Hypoxemia   . Atelectasis   . Chronic respiratory failure with hypoxia ([place]) 08/25/2017  . Pulmonary artery hypertension ([place]) 07/25/2017  . Pressure injury of skin 07/20/2017  . Goals of care, counseling/discussion 02/11/2017  . Diabetic polyneuropathy associated with type 2 diabetes mellitus ([place]) 01/14/2017  . Pleural effusion   . Chronic anemia 02/28/2016  . Diabetic retinopathy (  [place]) 11/13/2015  . Iron deficiency anemia 08/03/2015  . Type II diabetes mellitus with complication ([place])   . Chronic kidney disease (CKD), stage III (moderate) (HCC)   . 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 Deficiency anemia 01/15/2015  . CKD (chronic kidney disease) stage 4, GFR 15-29 ml/min (HCC) 01/15/2015  . Hypothyroidism 11/22/2014  . COPD GOLD III  02/16/2014  . Anemia  of renal disease   . Chronic diastolic CHF (congestive heart failure) ([place])   . History of tobacco use 08/23/2013  . 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 CAD (coronary artery disease) 04/06/2013  . PVD - hx of Rt SFA PTA and s/p Rt 4-5th toe amp Dec 2014 04/05/2013  . 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 Mixed hyperlipidemia 07/17/2008   PCP:  Kathrene Alu, MD Pharmacy:  No Pharmacies Listed    Social Determinants of Health (SDOH) Interventions    Readmission Risk Interventions No flowsheet data found.

## 2018-06-27 NOTE — Progress Notes (Signed)
DISCHARGE NOTE SNF [facility] KOSKA to be discharged [facility] per MD order. Patient verbalized understanding.  Skin clean, dry and intact without evidence of skin break down, no evidence of skin tears noted. IV catheter discontinued intact. Site without signs and symptoms of complications. Dressing and pressure applied. Pt denies pain at the site currently. No complaints noted.Called report to [facility]  Patient free of lines, drains, and wounds.   Discharge packet assembled. An After Visit Summary (AVS) was printed and given to the EMS personnel. Patient escorted via stretcher and discharged to [facility] via ambulance. Report called to accepting facility; all questions and concerns addressed.   [place], Zenon Mayo, RN

## 2018-06-28 DIAGNOSIS — D002 Carcinoma in situ of stomach: Secondary | ICD-10-CM | POA: Diagnosis not present

## 2018-06-28 DIAGNOSIS — C801 Malignant (primary) neoplasm, unspecified: Secondary | ICD-10-CM | POA: Diagnosis not present

## 2018-06-28 DIAGNOSIS — D649 Anemia, unspecified: Secondary | ICD-10-CM | POA: Diagnosis not present

## 2018-06-28 DIAGNOSIS — I2510 Atherosclerotic heart disease of native coronary artery without angina pectoris: Secondary | ICD-10-CM | POA: Diagnosis not present

## 2018-06-29 ENCOUNTER — Telehealth: Payer: Self-pay | Admitting: Radiation Oncology

## 2018-06-29 NOTE — Telephone Encounter (Signed)
Pt's niece called wanting clarification of discharge plans from Mr. Vandenheuvel's hospitalization. I let her know the information from the discharge summary that he would follow up with [name]. Ms. Uselman plans to call [facility] today to discuss with Dr. Marlowe Aschoff nurse.

## 2018-06-30 DIAGNOSIS — I5032 Chronic diastolic (congestive) heart failure: Secondary | ICD-10-CM | POA: Diagnosis not present

## 2018-06-30 DIAGNOSIS — I739 Peripheral vascular disease, unspecified: Secondary | ICD-10-CM | POA: Diagnosis not present

## 2018-06-30 DIAGNOSIS — D649 Anemia, unspecified: Secondary | ICD-10-CM | POA: Diagnosis not present

## 2018-06-30 DIAGNOSIS — I10 Essential (primary) hypertension: Secondary | ICD-10-CM | POA: Diagnosis not present

## 2018-07-02 ENCOUNTER — Telehealth: Payer: Self-pay | Admitting: Radiation Oncology

## 2018-07-02 DIAGNOSIS — C163 Malignant neoplasm of pyloric antrum: Secondary | ICD-10-CM | POA: Diagnosis not present

## 2018-07-02 NOTE — Telephone Encounter (Signed)
New Message:     LVM for [facility] according to his power of attorney I am to call them and schedule this appt through them.

## 2018-07-05 ENCOUNTER — Telehealth: Payer: Self-pay

## 2018-07-05 NOTE — Telephone Encounter (Signed)
Called pt. VM not set up. No answer. Ottis Stain, CMA

## 2018-07-05 NOTE — Telephone Encounter (Signed)
-----   Message from Kathrene Alu, MD sent at 07/02/2018  2:37 PM EDT ----- Regarding: Calling pt about appointment I got a message from Mr. Greenman's surgeon asking if he could get a CBC checked either every week or every other week, since he will get severe anemia often.  He has actually never seen me in the clinic, but I figured we can at least ask him if he would be okay coming for a visit with me and get the lab done at that time.  After that, we can make lab appointments for him every week or every other week to get a CBC checked.  Would you mind reaching out to him and asking if he would be okay with this?Thank you,  Estill Bamberg

## 2018-07-06 ENCOUNTER — Ambulatory Visit
Admission: RE | Admit: 2018-07-06 | Discharge: 2018-07-06 | Disposition: A | Discharge status: Home / Self Care | Payer: Medicare Other | Source: Ambulatory Visit | Attending: Radiation Oncology | Admitting: Radiation Oncology

## 2018-07-06 ENCOUNTER — Other Ambulatory Visit: Payer: Self-pay

## 2018-07-06 ENCOUNTER — Inpatient Hospital Stay (HOSPITAL_COMMUNITY)
Admission: EM | Admit: 2018-07-06 | Discharge: 2018-07-11 | DRG: 378 | Disposition: A | Discharge status: Home / Self Care | Payer: Medicare Other | Attending: Family Medicine | Admitting: Family Medicine

## 2018-07-06 DIAGNOSIS — Z89612 Acquired absence of left leg above knee: Secondary | ICD-10-CM

## 2018-07-06 DIAGNOSIS — E11319 Type 2 diabetes mellitus with unspecified diabetic retinopathy without macular edema: Secondary | ICD-10-CM | POA: Diagnosis present

## 2018-07-06 DIAGNOSIS — Z951 Presence of aortocoronary bypass graft: Secondary | ICD-10-CM

## 2018-07-06 DIAGNOSIS — J449 Chronic obstructive pulmonary disease, unspecified: Secondary | ICD-10-CM | POA: Insufficient documentation

## 2018-07-06 DIAGNOSIS — K2900 Acute gastritis without bleeding: Secondary | ICD-10-CM | POA: Diagnosis not present

## 2018-07-06 DIAGNOSIS — D649 Anemia, unspecified: Secondary | ICD-10-CM

## 2018-07-06 DIAGNOSIS — K2901 Acute gastritis with bleeding: Principal | ICD-10-CM | POA: Diagnosis present

## 2018-07-06 DIAGNOSIS — Z7982 Long term (current) use of aspirin: Secondary | ICD-10-CM

## 2018-07-06 DIAGNOSIS — Z7901 Long term (current) use of anticoagulants: Secondary | ICD-10-CM | POA: Insufficient documentation

## 2018-07-06 DIAGNOSIS — Z85828 Personal history of other malignant neoplasm of skin: Secondary | ICD-10-CM

## 2018-07-06 DIAGNOSIS — Z66 Do not resuscitate: Secondary | ICD-10-CM | POA: Diagnosis not present

## 2018-07-06 DIAGNOSIS — D509 Iron deficiency anemia, unspecified: Secondary | ICD-10-CM | POA: Insufficient documentation

## 2018-07-06 DIAGNOSIS — D696 Thrombocytopenia, unspecified: Secondary | ICD-10-CM | POA: Diagnosis present

## 2018-07-06 DIAGNOSIS — Z1159 Encounter for screening for other viral diseases: Secondary | ICD-10-CM

## 2018-07-06 DIAGNOSIS — E1142 Type 2 diabetes mellitus with diabetic polyneuropathy: Secondary | ICD-10-CM | POA: Diagnosis present

## 2018-07-06 DIAGNOSIS — D500 Iron deficiency anemia secondary to blood loss (chronic): Secondary | ICD-10-CM | POA: Insufficient documentation

## 2018-07-06 DIAGNOSIS — Z87891 Personal history of nicotine dependence: Secondary | ICD-10-CM | POA: Insufficient documentation

## 2018-07-06 DIAGNOSIS — Z79899 Other long term (current) drug therapy: Secondary | ICD-10-CM | POA: Insufficient documentation

## 2018-07-06 DIAGNOSIS — E1151 Type 2 diabetes mellitus with diabetic peripheral angiopathy without gangrene: Secondary | ICD-10-CM | POA: Insufficient documentation

## 2018-07-06 DIAGNOSIS — Z7189 Other specified counseling: Secondary | ICD-10-CM

## 2018-07-06 DIAGNOSIS — N179 Acute kidney failure, unspecified: Secondary | ICD-10-CM | POA: Diagnosis present

## 2018-07-06 DIAGNOSIS — Z79890 Hormone replacement therapy (postmenopausal): Secondary | ICD-10-CM

## 2018-07-06 DIAGNOSIS — E7800 Pure hypercholesterolemia, unspecified: Secondary | ICD-10-CM | POA: Insufficient documentation

## 2018-07-06 DIAGNOSIS — I130 Hypertensive heart and chronic kidney disease with heart failure and stage 1 through stage 4 chronic kidney disease, or unspecified chronic kidney disease: Secondary | ICD-10-CM | POA: Diagnosis present

## 2018-07-06 DIAGNOSIS — I5032 Chronic diastolic (congestive) heart failure: Secondary | ICD-10-CM | POA: Insufficient documentation

## 2018-07-06 DIAGNOSIS — I2510 Atherosclerotic heart disease of native coronary artery without angina pectoris: Secondary | ICD-10-CM | POA: Diagnosis present

## 2018-07-06 DIAGNOSIS — K922 Gastrointestinal hemorrhage, unspecified: Secondary | ICD-10-CM | POA: Diagnosis present

## 2018-07-06 DIAGNOSIS — N184 Chronic kidney disease, stage 4 (severe): Secondary | ICD-10-CM | POA: Insufficient documentation

## 2018-07-06 DIAGNOSIS — E039 Hypothyroidism, unspecified: Secondary | ICD-10-CM | POA: Diagnosis present

## 2018-07-06 DIAGNOSIS — C163 Malignant neoplasm of pyloric antrum: Secondary | ICD-10-CM | POA: Insufficient documentation

## 2018-07-06 DIAGNOSIS — I2721 Secondary pulmonary arterial hypertension: Secondary | ICD-10-CM | POA: Insufficient documentation

## 2018-07-06 DIAGNOSIS — D62 Acute posthemorrhagic anemia: Secondary | ICD-10-CM | POA: Diagnosis present

## 2018-07-06 DIAGNOSIS — J9611 Chronic respiratory failure with hypoxia: Secondary | ICD-10-CM | POA: Diagnosis present

## 2018-07-06 DIAGNOSIS — N400 Enlarged prostate without lower urinary tract symptoms: Secondary | ICD-10-CM | POA: Diagnosis present

## 2018-07-06 DIAGNOSIS — C169 Malignant neoplasm of stomach, unspecified: Secondary | ICD-10-CM

## 2018-07-06 DIAGNOSIS — R531 Weakness: Secondary | ICD-10-CM

## 2018-07-06 DIAGNOSIS — Z801 Family history of malignant neoplasm of trachea, bronchus and lung: Secondary | ICD-10-CM | POA: Insufficient documentation

## 2018-07-06 DIAGNOSIS — Z03818 Encounter for observation for suspected exposure to other biological agents ruled out: Secondary | ICD-10-CM | POA: Diagnosis not present

## 2018-07-06 DIAGNOSIS — E1122 Type 2 diabetes mellitus with diabetic chronic kidney disease: Secondary | ICD-10-CM | POA: Diagnosis present

## 2018-07-06 DIAGNOSIS — Z8249 Family history of ischemic heart disease and other diseases of the circulatory system: Secondary | ICD-10-CM

## 2018-07-06 DIAGNOSIS — Z794 Long term (current) use of insulin: Secondary | ICD-10-CM

## 2018-07-06 DIAGNOSIS — E782 Mixed hyperlipidemia: Secondary | ICD-10-CM | POA: Diagnosis present

## 2018-07-06 DIAGNOSIS — Z510 Encounter for antineoplastic radiation therapy: Secondary | ICD-10-CM | POA: Insufficient documentation

## 2018-07-06 DIAGNOSIS — D472 Monoclonal gammopathy: Secondary | ICD-10-CM | POA: Diagnosis present

## 2018-07-06 DIAGNOSIS — K921 Melena: Secondary | ICD-10-CM

## 2018-07-06 DIAGNOSIS — I159 Secondary hypertension, unspecified: Secondary | ICD-10-CM | POA: Diagnosis present

## 2018-07-06 DIAGNOSIS — Z86718 Personal history of other venous thrombosis and embolism: Secondary | ICD-10-CM | POA: Insufficient documentation

## 2018-07-06 DIAGNOSIS — Z993 Dependence on wheelchair: Secondary | ICD-10-CM

## 2018-07-06 LAB — CBC WITH DIFFERENTIAL/PLATELET
Abs Immature Granulocytes: 0.13 10*3/uL — ABNORMAL HIGH (ref 0.00–0.07)
Basophils Absolute: 0 10*3/uL (ref 0.0–0.1)
Basophils Relative: 0 %
Eosinophils Absolute: 0.5 10*3/uL (ref 0.0–0.5)
Eosinophils Relative: 7 %
HCT: 15.8 % — ABNORMAL LOW (ref 39.0–52.0)
Hemoglobin: 4.8 g/dL — CL (ref 13.0–17.0)
Immature Granulocytes: 2 %
Lymphocytes Relative: 14 %
Lymphs Abs: 0.9 10*3/uL (ref 0.7–4.0)
MCH: 28.1 pg (ref 26.0–34.0)
MCHC: 30.4 g/dL (ref 30.0–36.0)
MCV: 92.4 fL (ref 80.0–100.0)
Monocytes Absolute: 0.4 10*3/uL (ref 0.1–1.0)
Monocytes Relative: 6 %
Neutro Abs: 4.8 10*3/uL (ref 1.7–7.7)
Neutrophils Relative %: 71 %
Platelets: 136 10*3/uL — ABNORMAL LOW (ref 150–400)
RBC: 1.71 MIL/uL — ABNORMAL LOW (ref 4.22–5.81)
RDW: 15.3 % (ref 11.5–15.5)
WBC: 6.7 10*3/uL (ref 4.0–10.5)
nRBC: 0 % (ref 0.0–0.2)

## 2018-07-06 LAB — COMPREHENSIVE METABOLIC PANEL
ALT: 9 U/L (ref 0–44)
AST: 9 U/L — ABNORMAL LOW (ref 15–41)
Albumin: 2.9 g/dL — ABNORMAL LOW (ref 3.5–5.0)
Alkaline Phosphatase: 81 U/L (ref 38–126)
Anion gap: 9 (ref 5–15)
BUN: 86 mg/dL — ABNORMAL HIGH (ref 8–23)
CO2: 22 mmol/L (ref 22–32)
Calcium: 8.4 mg/dL — ABNORMAL LOW (ref 8.9–10.3)
Chloride: 108 mmol/L (ref 98–111)
Creatinine, Ser: 3.53 mg/dL — ABNORMAL HIGH (ref 0.61–1.24)
GFR calc Af Amer: 20 mL/min — ABNORMAL LOW (ref >60)
GFR calc non Af Amer: 17 mL/min — ABNORMAL LOW (ref >60)
Glucose, Bld: 247 mg/dL — ABNORMAL HIGH (ref 70–99)
Potassium: 4.1 mmol/L (ref 3.5–5.1)
Sodium: 139 mmol/L (ref 135–145)
Total Bilirubin: 0.3 mg/dL (ref 0.3–1.2)
Total Protein: 5.2 g/dL — ABNORMAL LOW (ref 6.5–8.1)

## 2018-07-06 LAB — OCCULT BLOOD GASTRIC / DUODENUM (SPECIMEN CUP): Occult Blood, Gastric: POSITIVE — AB

## 2018-07-06 LAB — PREPARE RBC (CROSSMATCH)

## 2018-07-06 LAB — SARS CORONAVIRUS 2 BY RT PCR (HOSPITAL ORDER, PERFORMED IN ~~LOC~~ HOSPITAL LAB): SARS Coronavirus 2: NEGATIVE

## 2018-07-06 MED ORDER — DOXAZOSIN MESYLATE 8 MG PO TABS
8.0000 mg | ORAL_TABLET | Freq: Every day | ORAL | Status: DC
  Administered 2018-07-07 – 2018-07-11 (×5): 8 mg via ORAL
  Filled 2018-07-06: qty 1
  Filled 2018-07-06 (×3): qty 4
  Filled 2018-07-06 (×4): qty 1
  Filled 2018-07-06 (×2): qty 4

## 2018-07-06 MED ORDER — PANTOPRAZOLE SODIUM 40 MG IV SOLR: 40.0000 mg | Freq: Two times a day (BID) | INTRAVENOUS | Status: DC

## 2018-07-06 MED ORDER — FERROUS SULFATE 325 (65 FE) MG PO TABS
325.0000 mg | ORAL_TABLET | Freq: Every day | ORAL | Status: DC
  Administered 2018-07-07 – 2018-07-11 (×5): 325 mg via ORAL
  Filled 2018-07-06 (×5): qty 1

## 2018-07-06 MED ORDER — SODIUM CHLORIDE 0.9% IV SOLUTION: Freq: Once | INTRAVENOUS | Status: DC

## 2018-07-06 MED ORDER — ATORVASTATIN CALCIUM 40 MG PO TABS
40.0000 mg | ORAL_TABLET | Freq: Every day | ORAL | Status: DC
  Administered 2018-07-07 – 2018-07-10 (×4): 40 mg via ORAL
  Filled 2018-07-06 (×4): qty 1

## 2018-07-06 MED ORDER — CLONIDINE HCL 0.2 MG/24HR TD PTWK: 0.2000 mg | MEDICATED_PATCH | TRANSDERMAL | Status: DC

## 2018-07-06 MED ORDER — ONDANSETRON HCL 4 MG PO TABS: 4.0000 mg | ORAL_TABLET | Freq: Four times a day (QID) | ORAL | Status: DC | PRN

## 2018-07-06 MED ORDER — SODIUM CHLORIDE 0.9 % IV SOLN
8.0000 mg/h | INTRAVENOUS | Status: DC
  Administered 2018-07-06: 8 mg/h via INTRAVENOUS
  Filled 2018-07-06 (×3): qty 80

## 2018-07-06 MED ORDER — IPRATROPIUM-ALBUTEROL 0.5-2.5 (3) MG/3ML IN SOLN: 3.0000 mL | Freq: Four times a day (QID) | RESPIRATORY_TRACT | Status: DC | PRN

## 2018-07-06 MED ORDER — UMECLIDINIUM BROMIDE 62.5 MCG/INH IN AEPB
1.0000 | INHALATION_SPRAY | Freq: Every day | RESPIRATORY_TRACT | Status: DC
  Administered 2018-07-09 – 2018-07-10 (×2): 1 via RESPIRATORY_TRACT
  Filled 2018-07-06: qty 7

## 2018-07-06 MED ORDER — SODIUM CHLORIDE 0.9 % IV SOLN
80.0000 mg | INTRAVENOUS | Status: DC
  Filled 2018-07-06: qty 80

## 2018-07-06 MED ORDER — ENSURE ENLIVE PO LIQD
237.0000 mL | Freq: Three times a day (TID) | ORAL | Status: DC
  Administered 2018-07-06 – 2018-07-11 (×12): 237 mL via ORAL

## 2018-07-06 MED ORDER — LEVOTHYROXINE SODIUM 100 MCG PO TABS
100.0000 ug | ORAL_TABLET | Freq: Every day | ORAL | Status: DC
  Administered 2018-07-07 – 2018-07-11 (×4): 100 ug via ORAL
  Filled 2018-07-06 (×4): qty 1

## 2018-07-06 MED ORDER — ACETAMINOPHEN 325 MG PO TABS: 650.0000 mg | ORAL_TABLET | Freq: Three times a day (TID) | ORAL | Status: DC | PRN

## 2018-07-06 MED ORDER — VITAMIN D3 25 MCG (1000 UNIT) PO TABS
2000.0000 [IU] | ORAL_TABLET | Freq: Three times a day (TID) | ORAL | Status: DC
  Administered 2018-07-06 – 2018-07-11 (×14): 2000 [IU] via ORAL
  Filled 2018-07-06 (×25): qty 2

## 2018-07-06 MED ORDER — SODIUM CHLORIDE 0.9 % IV SOLN
80.0000 mg | Freq: Once | INTRAVENOUS | Status: AC
  Administered 2018-07-06: 80 mg via INTRAVENOUS

## 2018-07-06 MED ORDER — ONDANSETRON HCL 4 MG/2ML IJ SOLN: 4.0000 mg | Freq: Four times a day (QID) | INTRAMUSCULAR | Status: DC | PRN

## 2018-07-06 MED ORDER — ONDANSETRON HCL 4 MG PO TABS: 4.0000 mg | ORAL_TABLET | Freq: Three times a day (TID) | ORAL | Status: DC | PRN

## 2018-07-06 NOTE — ED Notes (Signed)
Attempt to call report to floor x 1.   Number given for nurse to call back.

## 2018-07-06 NOTE — Consult Note (Addendum)
[facility] Consult Note   History Leonard [name] MRN # [id number]  Date of Admission: 07/06/2018 Date of Consultation: 07/06/2018 Referring physician: Dr. Mingo [name], Leonard Memos, MD Primary Care Provider: Kathrene Alu, MD Primary Gastroenterologist: Dr. Justice [name]   Reason for Consultation/Chief Complaint: Melena and acute blood loss anemia  Subjective  HPI:  This is a 68 year old man diagnosed several months ago with locally advanced gastric adenocarcinoma.  He has had recurrent admissions for ongoing GI bleeding from this mass, including as recently as May 20.  GI was consulted, it was determined from previous endoscopic work-up and recent endoscopic ultrasound findings that this mass and its bleeding were not amenable to endoscopic therapy.  According to discharge summary, patient was seen by surgery and oncology.  There were potential plans for surgical therapy after cardiac clearance, but this needed to be done by oncologic surgeon, Dr. Barry [name].  It seems the eventual decision was made to discharge him for outpatient management.  It sounds like he has continued to [name] melena since that discharge.  Earlier today he vomited coffee-ground material at home and came to the ED, with his hemoglobin at 4.8 down from a value of 8.1 on May 24.  It should be noted that Leonard [name] is a very reluctant historian with me today.  Says he feels "okay", keeps rolling over and asking to go back to sleep.  He consented to an examination, but would not tell me any more about his history or review of systems.  I was contacted for consultation by the admitting internal medicine resident.  It appears that Protonix bolus and infusion were ordered, but neither have yet been given.  The patient was ordered for 2 units of PRBCs with a hemoglobin check 2 hours after that.  That blood has also not yet been given. He has not had witnessed hematemesis's arrival, and his vital signs have been  stable.  ROS: Unable to obtain -and uncooperative He seems to deny abdominal pain at present, but says there was some "earlier".  Past Medical History Past Medical History:  Diagnosis Date  . Acute on chronic diastolic heart failure (Leonard [name]) 07/04/2014  . Anemia of renal disease   . Atherosclerotic peripheral vascular disease with gangrene (Leonard [name]) 02/14/2017  . CAD (coronary artery disease) 04/06/2013  . Cancer (Leonard [name])    skin cancer-  . CHF (congestive heart failure) ([place])   . Chronic anemia 02/28/2016  . Chronic deep vein thrombosis (DVT) of femoral vein of left lower extremity (Leonard [name]) 09/03/2016  . Chronic respiratory failure with hypoxia ([place]) 08/25/2017  . CKD (chronic kidney disease) stage 4, GFR 15-29 ml/min (Leonard [name]) 01/15/2015   Follows with [name].  Last visits:  07/23/16: No change in meds. Worried that if we increase diuretics could lead to another AKI  . CKD (chronic kidney disease), stage IV (Leonard [name])    07/07/2017  . Claudication (Leonard [name])   . Collapse of left talus 01/14/2017  . COPD GOLD III  02/16/2014   Quit smoking 2014  - PFT's  04/06/13   FEV1 1.28 (44 % ) ratio 48  p no % improvement from saba p ?  prior to study with DLCO  43/54 % corrects to 97  % for alv volume - 01/31/2016  After extensive coaching HFA effectiveness =    75% from baseline 0 > changed to sprivia respimat   - PFT's  03/17/2016  FEV1 2.25  (81 % ) ratio 88  p 2 % improvement from saba  p spiriva respimat x 2  prior to study with  . Coronary artery disease   . Diabetes ([name])   . Diabetic polyneuropathy associated with type 2 diabetes mellitus ([place]) 01/14/2017  . Diabetic retinopathy ([place]) 11/13/2015   Per Dr. Erenest Rasher eye exam in 10/17/15, mild background diabetic retinopathy  . Fatigue associated with anemia 01/21/2016  . Gangrene of toe ([place])    right 5th/notes 01/04/2013   . HCAP (healthcare-associated pneumonia)   . Hematuria 06/27/2015  . High cholesterol   . History of tobacco use 08/23/2013   Smoked pack per day  for 46 years Quit smoking - 02/03/2013   . Hypertension   . Hypothyroidism 11/22/2014  . Idiopathic chronic gout of left foot without tophus   . Iron deficiency anemia 08/03/2015  . Left foot pain 10/29/2016  . Long term (current) use of anticoagulants [Z79.01] 09/09/2016  . 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 Mixed hyperlipidemia 07/17/2008  . Morbid obesity due to excess calories (Leonard [name]) 03/17/2016  . PAD (peripheral artery disease) ([place])   . Pleural effusion   . Pneumonitis 07/26/2017  . Pulmonary artery hypertension ([place]) 07/25/2017  . PVD - hx of Rt SFA PTA and s/p Rt 4-5th toe amp Dec 2014 04/05/2013  . 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 Respiratory failure ([place] [name]) 08/13/2017  . Respiratory failure with hypoxia ([place]) 09/23/2017  . 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 Secondary hypertension   . Shortness of breath dyspnea    with exertion  . Skin lesion of face 02/22/2015  . SOB (shortness of breath)   . Type 2 diabetes mellitus with pressure callus ([place]) 12/29/2016  . Type II diabetes mellitus (Leonard [name])    Type II  . Type II diabetes mellitus with complication [name])     Past Surgical History Past Surgical History:  Procedure Laterality Date  . ABDOMINAL AORTOGRAM W/LOWER EXTREMITY N/A 03/12/2017   Procedure: ABDOMINAL AORTOGRAM W/LOWER EXTREMITY;  Surgeon: Conrad Tacoma, MD;  Location: [place] [facility];  Service: Cardiovascular;  Laterality: N/A;  . AMPUTATION Right 01/28/2013   Procedure: RAY AMPUTATION RIGHT  4th & 5th TOE;  Surgeon: Rosetta Posner, MD;  Location: [place];  Service: Vascular;  Laterality: Right;  . AMPUTATION Left 03/15/2017   Procedure: AMPUTATION ABOVE KNEE;  Surgeon: Waynetta Sandy, MD;  Location: [place];  Service: Vascular;  Laterality: Left;  . ANGIOPLASTY / STENTING FEMORAL Right 01/13/2013   superficial femoral artery x 2 (3 mm x 60 mm, 4 mm x 60 mm)  . AV FISTULA PLACEMENT Left 02/11/2016   Procedure: LEFT UPPER ARM BRACHIAL CEPHALIC  ARTERIOVENOUS (AV) FISTULA CREATION;  Surgeon: Conrad Loganton, MD;  Location: [place];  Service: Vascular;  Laterality: Left;  . BIOPSY  03/29/2018   Procedure: BIOPSY;  Surgeon: Rush Landmark Telford Nab., MD;  Location: [place];  Service: Gastroenterology;;  . BIOPSY  05/31/2018   Procedure: BIOPSY;  Surgeon: Irving Copas., MD;  Location: [place];  Service: Gastroenterology;;  . COLONOSCOPY W/ POLYPECTOMY    . CORONARY ARTERY BYPASS GRAFT N/A 04/07/2013   Procedure: CORONARY ARTERY BYPASS GRAFTING (CABG);  Surgeon: Ivin Poot, MD;  Location: [place];  Service: Open Heart Surgery;  Laterality: N/A;  . ESOPHAGOGASTRODUODENOSCOPY (EGD) WITH PROPOFOL N/A 03/29/2018   Procedure: ESOPHAGOGASTRODUODENOSCOPY (EGD) WITH PROPOFOL;  Surgeon: Rush Landmark Telford Nab., MD;  Location: [place];  Service: Gastroenterology;  Laterality: N/A;  . ESOPHAGOGASTRODUODENOSCOPY (EGD) WITH PROPOFOL N/A 05/31/2018   Procedure: ESOPHAGOGASTRODUODENOSCOPY (EGD)  WITH PROPOFOL;  Surgeon: Mansouraty, Telford Nab., MD;  Location: [place];  Service: Gastroenterology;  Laterality: N/A;  . EUS N/A 05/31/2018   Procedure: UPPER ENDOSCOPIC ULTRASOUND (EUS) RADIAL;  Surgeon: Rush Landmark Telford Nab., MD;  Location: [place];  Service: Gastroenterology;  Laterality: N/A;  . INTRAOPERATIVE TRANSESOPHAGEAL ECHOCARDIOGRAM N/A 04/07/2013   Procedure: INTRAOPERATIVE TRANSESOPHAGEAL ECHOCARDIOGRAM;  Surgeon: Ivin Poot, MD;  Location: [place];  Service: Open Heart Surgery;  Laterality: N/A;  . IR FLUORO GUIDE CV LINE RIGHT  11/04/2017  . IR REMOVAL TUN CV CATH W/O FL  11/27/2017  . IR US GUIDE VASC ACCESS RIGHT  11/04/2017  . LEFT HEART CATHETERIZATION WITH CORONARY ANGIOGRAM N/A 04/05/2013   Procedure: LEFT HEART CATHETERIZATION WITH CORONARY ANGIOGRAM;  Surgeon: Peter M Martinique, MD;  Location: [facility] CATH LAB;  Service: Cardiovascular;  Laterality: N/A;  . LOWER EXTREMITY ANGIOGRAM Bilateral 01/06/2013   Procedure: LOWER  EXTREMITY ANGIOGRAM;  Surgeon: Conrad [place], MD;  Location: [facility] CATH LAB;  Service: Cardiovascular;  Laterality: Bilateral;  . LOWER EXTREMITY ANGIOGRAM Right 01/13/2013   Procedure: LOWER EXTREMITY ANGIOGRAM;  Surgeon: Conrad [place], MD;  Location: [facility] CATH LAB;  Service: Cardiovascular;  Laterality: Right;  . SKIN CANCER EXCISION  2017  . THROAT SURGERY  1983   polyps  . TONSILLECTOMY AND ADENOIDECTOMY  ~ 1965    Family History Family History  Problem Relation Age of Onset  . Cancer Mother        lung cancer  . Hypertension Mother   . Cancer Sister        patient thinks it was uterine cancer  . Hypertension Sister   . Heart attack Sister   . Stroke Neg Hx     Social History Social History   Socioeconomic History  . Marital status: Single    Spouse name: Not on file  . Number of children: 0  . Years of education: Not on file  . Highest education level: Not on file  Occupational History  . Not on file  Social Needs  . Financial resource strain: Not on file  . Food insecurity:    Worry: Not on file    Inability: Not on file  . Transportation needs:    Medical: No    Non-medical: No  Tobacco Use  . Smoking status: Former Smoker    Packs/day: 1.00    Years: 46.00    Pack years: 46.00    Types: Cigarettes    Start date: 02/04/1967    Last attempt to quit: 02/03/2013    Years since quitting: 5.4  . Smokeless tobacco: Never Used  Substance and Sexual Activity  . Alcohol use: No    Alcohol/week: 0.0 standard drinks  . Drug use: No  . Sexual activity: Not Currently  Lifestyle  . Physical activity:    Days per week: Not on file    Minutes per session: Not on file  . Stress: Not on file  Relationships  . Social connections:    Talks on phone: Not on file    Gets together: Not on file    Attends religious service: Not on file    Active member of club or organization: Not on file    Attends meetings of clubs or organizations: Not on file    Relationship status: Not on  file  Other Topics Concern  . Not on file  Social History Narrative   Retired - former Sports coach.   Patient in skilled nursing facility.  Allergies Allergies  Allergen Reactions  . Penicillins Other (See Comments)    Has patient had a PCN reaction causing immediate rash, facial/tongue/throat swelling, SOB or lightheadedness with hypotension: Unknown Has patient had a PCN reaction causing severe rash involving mucus membranes or skin necrosis: Unknown Has patient had a PCN reaction that required hospitalization: Unknown Has patient had a PCN reaction occurring within the last 10 years: Unknown If all of the above answers are "NO", then may proceed with Cephalosporin use.     Outpatient Meds Home medications from the H+P and/or nursing med reconciliation reviewed.  Inpatient med list reviewed  _____________________________________________________________________ Objective   Exam:  Current vital signs  Patient Vitals for the past 8 hrs:  BP Temp Temp src Pulse Resp SpO2 Height Weight  07/06/18 1815 (!) 141/52 - - - 18 - - -  07/06/18 1800 (!) 135/53 - - - 18 - - -  07/06/18 1745 (!) 142/54 - - - 18 - - -  07/06/18 1730 (!) 133/53 - - - 20 - - -  07/06/18 1715 (!) 134/54 - - - 19 - - -  07/06/18 1700 (!) 118/51 - - - 19 - - -  07/06/18 1645 (!) 134/51 - - - 17 - - -  07/06/18 1615 (!) 108/50 - - 75 - 100 % - -  07/06/18 1600 (!) 117/49 - - 79 - 100 % - -  07/06/18 1545 (!) 111/49 - - 76 - 100 % - -  07/06/18 1422 - - - - - - 5\' 8"  (1.727 m) 78.5 kg  07/06/18 1421 (!) 140/40 98.3 F (36.8 C) Oral 72 16 98 % - -  07/06/18 1420 - - - - - 98 % - -   No intake or output data in the 24 hours ending 07/06/18 2016  Physical Exam:    General: this is a male patient in no acute distress.  He is arousable and conversational, though he is a limited and seemingly reluctant historian.  Eyes: sclera anicteric, no redness.  Conjunctival pallor  ENT: oral mucosa moist  without lesions, no cervical or supraclavicular lymphadenopathy,   CV: RRR without murmur, S1/S2, no JVD,, no peripheral edema  Resp: clear to auscultation bilaterally, normal RR and effort noted  GI: soft, no tenderness, with active bowel sounds. No guarding or palpable organomegaly noted  Skin; warm and dry, no rash or jaundice noted  Neuro: awake, alert and oriented x 3. Normal gross motor function and fluent speech. Ocular: Left forearm AV fistula with palpable thrill Labs:  CBC Latest Ref Rng & Units 07/06/2018 06/27/2018 06/26/2018  WBC 4.0 - 10.5 K/uL 6.7 5.1 4.4  Hemoglobin 13.0 - 17.0 g/dL 4.8(LL) 8.1(L) 8.2(L)  Hematocrit 39.0 - 52.0 % 15.8(L) 25.2(L) 25.2(L)  Platelets 150 - 400 K/uL 136(L) 124(L) 122(L)    CMP Latest Ref Rng & Units 07/06/2018 06/27/2018 06/26/2018  Glucose 70 - 99 mg/dL 247(H) 212(H) 195(H)  BUN 8 - 23 mg/dL 86(H) 41(H) 52(H)  Creatinine 0.61 - 1.24 mg/dL 3.53(H) 3.02(H) 3.33(H)  Sodium 135 - 145 mmol/L 139 140 138  Potassium 3.5 - 5.1 mmol/L 4.1 4.1 3.7  Chloride 98 - 111 mmol/L 108 109 109  CO2 22 - 32 mmol/L 22 21(L) 20(L)  Calcium 8.9 - 10.3 mg/dL 8.4(L) 8.0(L) 8.1(L)  Total Protein 6.5 - 8.1 g/dL 5.2(L) - -  Total Bilirubin 0.3 - 1.2 mg/dL 0.3 - -  Alkaline Phos 38 - 126 U/L 81 - -  AST  15 - 41 U/L 9(L) - -  ALT 0 - 44 U/L 9 - -    No results for input(s): INR in the last 168 hours.  Radiologic studies: Recent EUS report reviewed  @ASSESSMENTPLANBEGIN @ Impression: Hematemesis - self-limited coffee grounds emesis. Melena, which sounds as if it has been ongoing even since recent hospital discharge Anemia of acute on chronic GI blood loss Ulcerated gastric adenocarcinoma as source for ongoing GI blood loss anemia.   It is not clear that this patient has a defined treatment plan at present, and he has clearly failed outpatient monitoring and management of this.  He has had profound GI blood loss in less than 2 weeks.  Surgical and oncology  consultants must be brought on board to help define a clear treatment plan for him.  The bleeding is not amenable to endoscopic therapy, and appears to need resection or radiation, the latter being less likely to control the bleeding in the short-term.  Plan:  He can remain on a regular diet, as there are no current plans for endoscopy. It seems very likely that he will need more than 2 units of PRBCs.  Please watch his hemoglobin closely and transfuse as needed to keep hemoglobin over 7.  I have spoken with the patient's nurse to be sure that he receives the Protonix as ordered.  If he remains hemodynamically stable tomorrow, this can be converted to 40 mg IV twice daily.  These obtain oncologic surgery and oncology consultations to define a treatment plan for this patient.  Dr. Havery Moros will see this patient in follow-up tomorrow.  If overall scenario and recommendations remain the same, GI will most likely sign off.  (Extensive chart review required on this patient)  Thank you for the courtesy of this consult.  Please contact me with any questions or concerns.  Nelida Meuse III Office: [phone]

## 2018-07-06 NOTE — ED Triage Notes (Signed)
X 2 episodes of  emesis starting approx 30 minutes ago, Newly diagnosed with stomach CA per EMS report 1 month ago but has not started chemo treatment.  Pt drowsy, HOH but will answer some questions.  BKA LLE. Pt reports intermittent vomiting since then.

## 2018-07-06 NOTE — Progress Notes (Signed)
Radiation Oncology         ([phone]) [phone] ________________________________  Name: Leonard [name]        MRN: [id number]  Date of Service: 07/06/2018 DOB: 1950/10/25  CC:Leonard Alu, MD  Leonard Klein, MD     REFERRING PHYSICIAN: Stark Klein, MD   DIAGNOSIS: The encounter diagnosis was 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[place]).   HISTORY OF PRESENT ILLNESS: Leonard [name] is a 68 y.o. male seen at the request of [name] for a newly diagnosed adenocarcinoma of the gastric antrum. The patient has multiple medical comorbidities and has been residing in a SNF given these issues. He presented on 03/28/2018 to [facility] ED after an episode of progressive weakness and hematemesis with epigastric pain. He proceeded with EGD on 03/29/2018 with Dr. Rush Landmark and this revealed a large ulcerated lesion in the gastric antrum at the level of the pylorus measruing about 25 mm. He had stigmata of bleeding noted from this site and this partially obstructed the stomach. A biopsy of the site revealed adenocarcinoma and high grade dysplastic findings and intestinal metaplasia, negative for H Pylori. He had CT imaging of the C/A/P while he was hospitalized that did not reveal concerns for any metastatic disease. He met with oncology and [name] does not feel that he's a good candidate for chemotherapy. He did undergo evaluation with PET on 04/21/2018 that did not reveal evidence of metastatic disease. While being considered for surgical resection repeat endoscopy with EUS on 05/31/2018 confirmed T2N0 disease. He met with cardiology and was felt to be somewhat low risk for surgery but given his comorbidities, he and [name] have elected to forgo surgery at this juncture. He has been admitted recently as well for chronic blood loss anemia and it is unclear who plans to follow this. He is seen today via webex telemedicine encounter.  PREVIOUS RADIATION THERAPY: No   PAST MEDICAL HISTORY:  Past Medical  History:  Diagnosis Date  . Acute on chronic diastolic heart failure (Leonard [name]) 07/04/2014  . Anemia of renal disease   . Atherosclerotic peripheral vascular disease with gangrene (Leonard [name]) 02/14/2017  . CAD (coronary artery disease) 04/06/2013  . Cancer ([place])    skin cancer-  . CHF (congestive heart failure) (Leonard [name])   . Chronic anemia 02/28/2016  . Chronic deep vein thrombosis (DVT) of femoral vein of left lower extremity ([place]) 09/03/2016  . Chronic respiratory failure with hypoxia ([place]) 08/25/2017  . CKD (chronic kidney disease) stage 4, GFR 15-29 ml/min (H[facility]) 01/15/2015   Follows with [facility].  Last visits:  07/23/16: No change in meds. Worried that if we increase diuretics could lead to another AKI  . CKD (chronic kidney disease), stage IV (Leonard [name])    07/07/2017  . Claudication (Leonard [name])   . Collapse of left talus 01/14/2017  . COPD GOLD III  02/16/2014   Quit smoking 2014  - PFT's  04/06/13   FEV1 1.28 (44 % ) ratio 48  p no % improvement from saba p ?  prior to study with DLCO  43/54 % corrects to 97  % for alv volume - 01/31/2016  After extensive coaching HFA effectiveness =    75% from baseline 0 > changed to sprivia respimat   - PFT's  03/17/2016  FEV1 2.25  (81 % ) ratio 88  p 2 % improvement from saba p spiriva respimat x 2  prior to study with  . Coronary artery disease   . Diabetes ([place])   .  Diabetic polyneuropathy associated with type 2 diabetes mellitus (Leonard [name]) 01/14/2017  . Diabetic retinopathy (Leonard [name]) 11/13/2015   Per Dr. Erenest [name] eye exam in 10/17/15, mild background diabetic retinopathy  . Fatigue associated with anemia 01/21/2016  . Gangrene of toe (Leonard [name])    right 5th/notes 01/04/2013   . HCAP (healthcare-associated pneumonia)   . Hematuria 06/27/2015  . High cholesterol   . History of tobacco use 08/23/2013   Smoked pack per day for 46 years Quit smoking - 02/03/2013   . Hypertension   . Hypothyroidism 11/22/2014  . Idiopathic chronic gout of left foot without tophus   . Iron deficiency anemia  08/03/2015  . Left foot pain 10/29/2016  . Long term (current) use of anticoagulants [Z79.01] 09/09/2016  . 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 Mixed hyperlipidemia 07/17/2008  . Morbid obesity due to excess calories (Leonard [name]) 03/17/2016  . PAD (peripheral artery disease) (Leonard [name])   . Pleural effusion   . Pneumonitis 07/26/2017  . Pulmonary artery hypertension (Leonard [name]) 07/25/2017  . PVD - hx of Rt SFA PTA and s/p Rt 4-5th toe amp Dec 2014 04/05/2013  . 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 Respiratory failure (Leonard [name]) 08/13/2017  . Respiratory failure with hypoxia (Leonard [name]) 09/23/2017  . 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 Secondary hypertension   . Shortness of breath dyspnea    with exertion  . Skin lesion of face 02/22/2015  . SOB (shortness of breath)   . Type 2 diabetes mellitus with pressure callus ([place]) 12/29/2016  . Type II diabetes mellitus (HCC)    Type II  . Type II diabetes mellitus with complication (Leonard [name])        PAST SURGICAL HISTORY: Past Surgical History:  Procedure Laterality Date  . ABDOMINAL AORTOGRAM W/LOWER EXTREMITY N/A 03/12/2017   Procedure: ABDOMINAL AORTOGRAM W/LOWER EXTREMITY;  Surgeon: Conrad Onaka, MD;  Location: [place] [facility];  Service: Cardiovascular;  Laterality: N/A;  . AMPUTATION Right 01/28/2013   Procedure: RAY AMPUTATION RIGHT  4th & 5th TOE;  Surgeon: Rosetta Posner, MD;  Location: [place];  Service: Vascular;  Laterality: Right;  . AMPUTATION Left 03/15/2017   Procedure: AMPUTATION ABOVE [name];  Surgeon: Waynetta Sandy, MD;  Location: [place];  Service: Vascular;  Laterality: Left;  . ANGIOPLASTY / STENTING FEMORAL Right 01/13/2013   superficial femoral artery x 2 (3 mm x 60 mm, 4 mm x 60 mm)  . AV FISTULA PLACEMENT Left 02/11/2016   Procedure: LEFT UPPER ARM BRACHIAL CEPHALIC ARTERIOVENOUS (AV) FISTULA CREATION;  Surgeon: Conrad Catlettsburg, MD;  Location: [place];  Service: Vascular;  Laterality: Left;  . BIOPSY  03/29/2018   Procedure: BIOPSY;   Surgeon: Rush Landmark Telford Nab., MD;  Location: [facility];  Service: Gastroenterology;;  . BIOPSY  05/31/2018   Procedure: BIOPSY;  Surgeon: Irving Copas., MD;  Location: [place];  Service: Gastroenterology;;  . COLONOSCOPY W/ POLYPECTOMY    . CORONARY ARTERY BYPASS GRAFT N/A 04/07/2013   Procedure: CORONARY ARTERY BYPASS GRAFTING (CABG);  Surgeon: Ivin Poot, MD;  Location: [place];  Service: Open Heart Surgery;  Laterality: N/A;  . ESOPHAGOGASTRODUODENOSCOPY (EGD) WITH PROPOFOL N/A 03/29/2018   Procedure: ESOPHAGOGASTRODUODENOSCOPY (EGD) WITH PROPOFOL;  Surgeon: Rush Landmark Telford Nab., MD;  Location: [place];  Service: Gastroenterology;  Laterality: N/A;  . ESOPHAGOGASTRODUODENOSCOPY (EGD) WITH PROPOFOL N/A 05/31/2018   Procedure: ESOPHAGOGASTRODUODENOSCOPY (EGD) WITH PROPOFOL;  Surgeon: Rush Landmark Telford Nab., MD;  Location: [place];  Service: Gastroenterology;  Laterality: N/A;  .  EUS N/A 05/31/2018   Procedure: UPPER ENDOSCOPIC ULTRASOUND (EUS) RADIAL;  Surgeon: Irving Copas., MD;  Location: [place];  Service: Gastroenterology;  Laterality: N/A;  . INTRAOPERATIVE TRANSESOPHAGEAL ECHOCARDIOGRAM N/A 04/07/2013   Procedure: INTRAOPERATIVE TRANSESOPHAGEAL ECHOCARDIOGRAM;  Surgeon: Ivin Poot, MD;  Location: [place];  Service: Open Heart Surgery;  Laterality: N/A;  . IR FLUORO GUIDE CV LINE RIGHT  11/04/2017  . IR REMOVAL TUN CV CATH W/O FL  11/27/2017  . IR US GUIDE VASC ACCESS RIGHT  11/04/2017  . LEFT HEART CATHETERIZATION WITH CORONARY ANGIOGRAM N/A 04/05/2013   Procedure: LEFT HEART CATHETERIZATION WITH CORONARY ANGIOGRAM;  Surgeon: Peter M Martinique, MD;  Location: [facility] CATH LAB;  Service: Cardiovascular;  Laterality: N/A;  . LOWER EXTREMITY ANGIOGRAM Bilateral 01/06/2013   Procedure: LOWER EXTREMITY ANGIOGRAM;  Surgeon: Conrad Braddock, MD;  Location[facility] CATH LAB;  Service: Cardiovascular;  Laterality: Bilateral;  . LOWER EXTREMITY ANGIOGRAM Right 01/13/2013    Procedure: LOWER EXTREMITY ANGIOGRAM;  Surgeon: Conrad Baton Rouge, MD;  Location: [facility] CATH LAB;  Service: Cardiovascular;  Laterality: Right;  . SKIN CANCER EXCISION  2017  . THROAT SURGERY  1983   polyps  . TONSILLECTOMY AND ADENOIDECTOMY  ~ 1965     FAMILY HISTORY:  Family History  Problem Relation Age of Onset  . Cancer Mother        lung cancer  . Hypertension Mother   . Cancer Sister        patient thinks it was uterine cancer  . Hypertension Sister   . Heart attack Sister   . Stroke Neg Hx      SOCIAL HISTORY:  reports that he quit smoking about 5 years ago. His smoking use included cigarettes. He started smoking about 51 years ago. He has a 46.00 pack-year smoking history. He has never used smokeless tobacco. He reports that he does not drink alcohol or use drugs. The patient is single and lives in [place]. He's accompanied by his sister and niece by webex.    ALLERGIES: Penicillins   MEDICATIONS:  Current Outpatient Medications  Medication Sig Dispense Refill  . acetaminophen (TYLENOL) 325 MG tablet Take 650 mg by mouth every 8 (eight) hours as needed for mild pain or fever.     Marland Kitchen amLODipine (NORVASC) 10 MG tablet Take 1 tablet (10 mg total) by mouth daily. 90 tablet 3  . atorvastatin (LIPITOR) 40 MG tablet TAKE ONE (1) TABLET BY MOUTH EVERY DAY AT 6:00PM (Patient taking differently: Take 40 mg by mouth daily at 6 PM. ) 90 tablet 3  . Blood Glucose Monitoring Suppl (ONETOUCH VERIO FLEX SYSTEM) w/Device KIT 1 kit by Does not apply route daily. 1 kit 3  . carvedilol (COREG) 12.5 MG tablet Take 1 tablet (12.5 mg total) by mouth 2 (two) times daily with a meal. 60 tablet 0  . cholecalciferol (VITAMIN D) 25 MCG (1000 UT) tablet Take 2,000 Units by mouth 3 (three) times daily.    . cloNIDine (CATAPRES - DOSED IN MG/24 HR) 0.2 mg/24hr patch Place 1 patch (0.2 mg total) onto the skin once a week. (Patient taking differently: Place 0.2 mg onto the skin every Tuesday. ) 4 patch 0   . Darbepoetin Alfa (ARANESP) 40 MCG/0.4ML SOSY injection Inject 0.4 mLs (40 mcg total) into the skin every 30 (thirty) days. 8.4 mL 2  . doxazosin (CARDURA) 8 MG tablet Take 1 tablet (8 mg total) by mouth every evening.    . feeding supplement, ENSURE ENLIVE, (  ENSURE ENLIVE) LIQD Take 237 mLs by mouth 3 (three) times daily between meals. 237 mL 12  . ferrous sulfate (FEROSUL) 325 (65 FE) MG tablet Take 1 tablet (325 mg total) by mouth daily at 12 noon. (Patient taking differently: Take 325 mg by mouth daily. ) 30 tablet 0  . glucose blood (ONETOUCH VERIO) test strip Use as instructed to test three times daily. ICD-10 code: E11.22 100 each 12  . hydrALAZINE (APRESOLINE) 100 MG tablet TAKE ONE TABLET BY MOUTH EVERY EIGHT HOURS (Patient taking differently: Take 100 mg by mouth every 8 (eight) hours. ) 90 tablet 1  . insulin aspart (NOVOLOG) 100 UNIT/ML injection Inject 0-10 Units into the skin See admin instructions. Three times daily per sliding scale: <150 = 0 units 151-200 = 2 units 201-250 = 4 units 251-300 = 6 units 301-350 = 8 units 351-400 = 10 units Call PCP if CBG <70 or >400    . ipratropium-albuterol (DUONEB) 0.5-2.5 (3) MG/3ML SOLN Take 3 mLs by nebulization every 6 (six) hours as needed (shortness of breath and wheezing).     . isosorbide mononitrate (IMDUR) 120 MG 24 hr tablet Take 1 tablet (120 mg total) by mouth daily. 30 tablet 0  . levothyroxine (SYNTHROID, LEVOTHROID) 100 MCG tablet Take 100 mcg by mouth daily before breakfast.     . nitroGLYCERIN (NITROSTAT) 0.4 MG SL tablet Place 1 tablet (0.4 mg total) under the tongue every 5 (five) minutes as needed for chest pain. 30 tablet 12  . ONETOUCH DELICA LANCETS 20B MISC 1 each by Does not apply route 3 (three) times daily. ICD-10 code: E11.22 100 each 12  . pantoprazole (PROTONIX) 40 MG tablet Take 1 tablet (40 mg total) by mouth 2 (two) times daily.    . polyethylene glycol (MIRALAX / GLYCOLAX) packet Take 17 g by mouth daily as  needed for mild constipation or moderate constipation. (Patient taking differently: Take 17 g by mouth daily as needed for mild constipation. ) 14 each 0  . potassium chloride SA (K-DUR,KLOR-CON) 20 MEQ tablet Take 2 tablets (40 mEq total) by mouth daily. 60 tablet 11  . torsemide (DEMADEX) 20 MG tablet Take 2 tablets (40 mg total) by mouth daily. 120 tablet 11  . umeclidinium bromide (INCRUSE ELLIPTA) 62.5 MCG/INH AEPB Inhale 1 puff into the lungs daily. 30 each 0   No current facility-administered medications for this encounter.    Facility-Administered Medications Ordered in Other Encounters  Medication Dose Route Frequency Provider Last Rate Last Dose  . sodium chloride flush (NS) 0.9 % injection 10 mL  10 mL Intracatheter PRN Heath Lark, MD         REVIEW OF SYSTEMS: On review of systems, the patient has had problems with fatigue, lethargy, as well as coffee ground emesis for several days. He apparently was wearing oxygen, and was less alert per nursing and family. Mid call he was transferred by ambulance to [facility] ED.    PHYSICAL EXAM:  Unable to assess.   ECOG = 1  0 - Asymptomatic (Fully active, able to carry on all predisease activities without restriction)  1 - Symptomatic but completely ambulatory (Restricted in physically strenuous activity but ambulatory and able to carry out work of a light or sedentary nature. For example, light housework, office work)  2 - Symptomatic, <50% in bed during the day (Ambulatory and capable of all self care but unable to carry out any work activities. Up and about more than 50% of waking hours)  3 - Symptomatic, >50% in bed, but not bedbound (Capable of only limited self-care, confined to bed or chair 50% or more of waking hours)  4 - Bedbound (Completely disabled. Cannot carry on any self-care. Totally confined to bed or chair)  5 - Death   Eustace Pen MM, Creech RH, Tormey DC, et al. [id number]). "Toxicity and response criteria of the [facility] Group". [place] Oncol. 5 (6): 649-55    LABORATORY DATA:  Lab Results  Component Value Date   WBC 5.1 06/27/2018   HGB 8.1 (L) 06/27/2018   HCT 25.2 (L) 06/27/2018   MCV 88.4 06/27/2018   PLT 124 (L) 06/27/2018   Lab Results  Component Value Date   NA 140 06/27/2018   K 4.1 06/27/2018   CL 109 06/27/2018   CO2 21 (L) 06/27/2018   Lab Results  Component Value Date   ALT 8 06/24/2018   AST 9 (L) 06/24/2018   ALKPHOS 93 06/24/2018   BILITOT 0.8 06/24/2018      RADIOGRAPHY: Nm Myocar Multi W/spect W/wall Motion / Ef  Addendum Date: 06/25/2018    There was no ST segment deviation noted during stress.  Nuclear stress EF: 55%. The left ventricular ejection fraction is normal (55-65%).  This is a low risk study. The stress images are somewhat difficult to assess due to relatively low counts compared to the resting images . There appears to be diffuse attenuation of the entire LV but particularly in the lateral wal  The lateral wall contracts normally and his EF is normal . The patient is asymptomatic.  I think the patient is at low risk for his upcoming gastric surgery    Result Date: 06/25/2018  There was no ST segment deviation noted during stress.  Nuclear stress EF: 55%. The left ventricular ejection fraction is normal (55-65%).  This is a low risk study. There is no evidence of ischemia and no evidence of previous infarction  The study is normal.    Dg Chest Portable 1 View  Result Date: 06/23/2018 CLINICAL DATA:  Weakness vomiting EXAM: PORTABLE CHEST 1 VIEW COMPARISON:  03/28/2018, PET-CT 04/21/2018 FINDINGS: Possible hazy infiltrate at the left base. Mild cardiomegaly. No pleural effusion or pneumothorax. IMPRESSION: Cardiomegaly.  Possible mild hazy infiltrate at the left base. Electronically Signed   By: Donavan Foil M.D.   On: 06/23/2018 21:27       IMPRESSION/PLAN: 1. Stage I, cT2N0M0 adenocarcinoma of the stomach. Dr. Lisbeth Renshaw has  previously reviewed pathology findings and reviews the nature of gastric cancer. While he has early disease, he is considered high risk to proceed with surgical intervention. After discussing this further with [name], he has elected to proceed with radiotherapy. [name] did discuss that she would not rule out the options to consider gastrectomy at a later date if he had progressive or recurrent bleeding episodes. We discussed the risks, benefits, short, and long term effects of radiotherapy, and the patient is interested in proceeding. As the patient was taken mid evaluation, I finished the discussion with his sister Zettie Pho and niece Jaleal Schliep by phone. We discussed the the delivery and logistics of radiotherapy and anticipates a course of 5 1/2 weeks of radiotherapy. He will be going to the ED now due to his bleeding. His HCPOAs Rosalyn Dayhoff and Zettie Pho have given verbal consent to proceed. We will plan simulation tomorrow and to begin treatment Thursday.   2. Acute on Chronic blood loss anemia in the  setting of chronic kidney disease.  For now his SNF has been directed to take him to the ED for evaluation. I spoke with Leonard Jock, NP who has agreed to manage his anemia when he returns to the facility as well.  3. Concerns with Diet. [name] told his family that he should be eating low acidity foods, and softer textured foods. I've discussed this with Leonard Jock, NP at the facility to make sure this continues when he returns.  This encounter was provided by telemedicine platform Webex.  The patient has given verbal consent for this type of encounter and has been advised to only accept a meeting of this type in a secure network environment. The time spent during this encounter was 45 minutes, splitting some of that time face to face with telephone discussion. The attendants for this meeting include Blenda Nicely, RN, Hayden Pedro  and [name], along with Zettie Pho his sister, and niece Kirtan Sada.  During the encounter, Blenda Nicely, RN, was  located at [facility] Radiation Oncology Department. I was located remotely at home. GABRIEN MENTINK was located at the facility, and Zettie Pho and Trevontae Lindahl were located at home together.     Carola Rhine, PAC

## 2018-07-06 NOTE — ED Notes (Signed)
ED TO INPATIENT HANDOFF REPORT  ED Nurse Name and Phone #: Celene Squibb RN  S Name/Age/Gender Leonard [name] 68 y.o. male Room/Bed: 018C/018C  Code Status   Code Status: Full Code  Home/SNF/Other Home Patient oriented to: self, place, time and situation Is this baseline? Yes   Triage Complete: Triage complete  Chief Complaint vomiting  Triage Note X 2 episodes of  emesis starting approx 30 minutes ago, Newly diagnosed with stomach CA per EMS report 1 month ago but has not started chemo treatment.  Pt drowsy, HOH but will answer some questions.  BKA LLE. Pt reports intermittent vomiting since then.       Allergies Allergies  Allergen Reactions  . Penicillins Other (See Comments)    Has patient had a PCN reaction causing immediate rash, facial/tongue/throat swelling, SOB or lightheadedness with hypotension: Unknown Has patient had a PCN reaction causing severe rash involving mucus membranes or skin necrosis: Unknown Has patient had a PCN reaction that required hospitalization: Unknown Has patient had a PCN reaction occurring within the last 10 years: Unknown If all of the above answers are "NO", then may proceed with Cephalosporin use.     Level of Care/Admitting Diagnosis ED Disposition    ED Disposition Condition Comment   Admit  Hospital Area: [facility] [[id number]]  Level of Care: Med-Surg [16]  Covid Evaluation: Screening Protocol (No Symptoms)  Diagnosis: GI bleed [937902]  Admitting Physician: Matilde Haymaker [[id number]]  Attending Physician: Mingo Amber, JEFFREY H [[id number]]  PT Class (Do Not Modify): Observation [104]  PT Acc Code (Do Not Modify): Observation [10022]       B Medical/Surgery History Past Medical History:  Diagnosis Date  . Acute on chronic diastolic heart failure ([place]) 07/04/2014  . Anemia of renal disease   . Atherosclerotic peripheral vascular disease with gangrene ([place]) 02/14/2017  . CAD (coronary artery disease) 04/06/2013  .  Cancer ([place])    skin cancer-  . CHF (congestive heart failure) ([place])   . Chronic anemia 02/28/2016  . Chronic deep vein thrombosis (DVT) of femoral vein of left lower extremity ([place]) 09/03/2016  . Chronic respiratory failure with hypoxia ([place]) 08/25/2017  . CKD (chronic kidney disease) stage 4, GFR 15-29 ml/min (HCC) 01/15/2015   Follows with [facility].  Last visits:  07/23/16: No change in meds. Worried that if we increase diuretics could lead to another AKI  . CKD (chronic kidney disease), stage IV ([place])    07/07/2017  . Claudication ([place])   . Collapse of left talus 01/14/2017  . COPD GOLD III  02/16/2014   Quit smoking 2014  - PFT's  04/06/13   FEV1 1.28 (44 % ) ratio 48  p no % improvement from saba p ?  prior to study with DLCO  43/54 % corrects to 97  % for alv volume - 01/31/2016  After extensive coaching HFA effectiveness =    75% from baseline 0 > changed to sprivia respimat   - PFT's  03/17/2016  FEV1 2.25  (81 % ) ratio 88  p 2 % improvement from saba p spiriva respimat x 2  prior to study with  . Coronary artery disease   . Diabetes ([place])   . Diabetic polyneuropathy associated with type 2 diabetes mellitus ([place]) 01/14/2017  . Diabetic retinopathy ([place]) 11/13/2015   Per Dr. Erenest Rasher eye exam in 10/17/15, mild background diabetic retinopathy  . Fatigue associated with anemia 01/21/2016  . Gangrene of toe ([place])    right  5th/notes 01/04/2013   . HCAP (healthcare-associated pneumonia)   . Hematuria 06/27/2015  . High cholesterol   . History of tobacco use 08/23/2013   Smoked pack per day for 46 years Quit smoking - 02/03/2013   . Hypertension   . Hypothyroidism 11/22/2014  . Idiopathic chronic gout of left foot without tophus   . Iron deficiency anemia 08/03/2015  . Left foot pain 10/29/2016  . Long term (current) use of anticoagulants [Z79.01] 09/09/2016  . 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 Mixed hyperlipidemia 07/17/2008  . Morbid obesity due to excess  calories ([place]) 03/17/2016  . PAD (peripheral artery disease) ([place])   . Pleural effusion   . Pneumonitis 07/26/2017  . Pulmonary artery hypertension ([place]) 07/25/2017  . PVD - hx of Rt SFA PTA and s/p Rt 4-5th toe amp Dec 2014 04/05/2013  . 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 Respiratory failure ([place]) 08/13/2017  . Respiratory failure with hypoxia ([place]) 09/23/2017  . 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 Secondary hypertension   . Shortness of breath dyspnea    with exertion  . Skin lesion of face 02/22/2015  . SOB (shortness of breath)   . Type 2 diabetes mellitus with pressure callus ([place]) 12/29/2016  . Type II diabetes mellitus (HCC)    Type II  . Type II diabetes mellitus with complication [facility])    Past Surgical History:  Procedure Laterality Date  . ABDOMINAL AORTOGRAM W/LOWER EXTREMITY N/A 03/12/2017   Procedure: ABDOMINAL AORTOGRAM W/LOWER EXTREMITY;  Surgeon: Conrad [place], MD;  Location: [facility] CV LAB;  Service: Cardiovascular;  Laterality: N/A;  . AMPUTATION Right 01/28/2013   Procedure: RAY AMPUTATION RIGHT  4th & 5th TOE;  Surgeon: Rosetta Posner, MD;  Location: [facility];  Service: Vascular;  Laterality: Right;  . AMPUTATION Left 03/15/2017   Procedure: AMPUTATION ABOVE KNEE;  Surgeon: Waynetta Sandy, MD;  Location: [facility];  Service: Vascular;  Laterality: Left;  . ANGIOPLASTY / STENTING FEMORAL Right 01/13/2013   superficial femoral artery x 2 (3 mm x 60 mm, 4 mm x 60 mm)  . AV FISTULA PLACEMENT Left 02/11/2016   Procedure: LEFT UPPER ARM BRACHIAL CEPHALIC ARTERIOVENOUS (AV) FISTULA CREATION;  Surgeon: Conrad Poteau, MD;  Location: [facility];  Service: Vascular;  Laterality: Left;  . BIOPSY  03/29/2018   Procedure: BIOPSY;  Surgeon: Rush Landmark Telford Nab., MD;  Location: [facility];  Service: Gastroenterology;;  . BIOPSY  05/31/2018   Procedure: BIOPSY;  Surgeon: Irving Copas., MD;  Location: [name];  Service: Gastroenterology;;  . COLONOSCOPY W/ POLYPECTOMY    . CORONARY  ARTERY BYPASS GRAFT N/A 04/07/2013   Procedure: CORONARY ARTERY BYPASS GRAFTING (CABG);  Surgeon: Ivin Poot, MD;  Location: [facility];  Service: Open Heart Surgery;  Laterality: N/A;  . ESOPHAGOGASTRODUODENOSCOPY (EGD) WITH PROPOFOL N/A 03/29/2018   Procedure: ESOPHAGOGASTRODUODENOSCOPY (EGD) WITH PROPOFOL;  Surgeon: Rush Landmark Telford Nab., MD;  Location: [facility];  Service: Gastroenterology;  Laterality: N/A;  . ESOPHAGOGASTRODUODENOSCOPY (EGD) WITH PROPOFOL N/A 05/31/2018   Procedure: ESOPHAGOGASTRODUODENOSCOPY (EGD) WITH PROPOFOL;  Surgeon: Rush Landmark Telford Nab., MD;  Location: [facility];  Service: Gastroenterology;  Laterality: N/A;  . EUS N/A 05/31/2018   Procedure: UPPER ENDOSCOPIC ULTRASOUND (EUS) RADIAL;  Surgeon: Rush Landmark Telford Nab., MD;  Location: [facility];  Service: Gastroenterology;  Laterality: N/A;  . INTRAOPERATIVE TRANSESOPHAGEAL ECHOCARDIOGRAM N/A 04/07/2013   Procedure: INTRAOPERATIVE TRANSESOPHAGEAL ECHOCARDIOGRAM;  Surgeon: Ivin Poot, MD;  Location: [facility];  Service: Open  Heart Surgery;  Laterality: N/A;  . IR FLUORO GUIDE CV LINE RIGHT  11/04/2017  . IR REMOVAL TUN CV CATH W/O FL  11/27/2017  . IR US GUIDE VASC ACCESS RIGHT  11/04/2017  . LEFT HEART CATHETERIZATION WITH CORONARY ANGIOGRAM N/A 04/05/2013   Procedure: LEFT HEART CATHETERIZATION WITH CORONARY ANGIOGRAM;  Surgeon: Peter M Martinique, MD;  Location: [facility] CATH LAB;  Service: Cardiovascular;  Laterality: N/A;  . LOWER EXTREMITY ANGIOGRAM Bilateral 01/06/2013   Procedure: LOWER EXTREMITY ANGIOGRAM;  Surgeon: Conrad Wamac, MD;  Location: [facility] C[facility] LAB;  Service: Cardiovascular;  Laterality: Bilateral;  . LOWER EXTREMITY ANGIOGRAM Right 01/13/2013   Procedure: LOWER EXTREMITY ANGIOGRAM;  Surgeon: Conrad Sesser, MD;  Location: [facility] CATH LAB;  Service: Cardiovascular;  Laterality: Right;  . SKIN CANCER EXCISION  2017  . THROAT SURGERY  1983   polyps  . TONSILLECTOMY AND ADENOIDECTOMY  ~ 1965     A IV  Location/Drains/Wounds Patient Lines/Drains/Airways Status   Active Line/Drains/Airways    Name:   Placement date:   Placement time:   Site:   Days:   Fistula / Graft Left Upper arm Arteriovenous vein graft   07/03/17    0000    Upper arm   368   Tunneled CVC Single Lumen (Radiology) 11/04/17 Right Internal jugular 24 cm   11/04/17    1009    Internal jugular   244          Intake/Output Last 24 hours No intake or output data in the 24 hours ending 07/06/18 1810  Labs/Imaging Results for orders placed or performed during the hospital encounter of 07/06/18 (from the past 48 hour(s))  CBC with Differential/Platelet     Status: Abnormal   Collection Time: 07/06/18  3:17 PM  Result Value Ref Range   WBC 6.7 4.0 - 10.5 K/uL   RBC 1.71 (L) 4.22 - 5.81 MIL/uL   Hemoglobin 4.8 (LL) 13.0 - 17.0 g/dL    Comment: This critical result has verified and been called to Adora Fridge by Elaina Pattee on 06 02 2020 at 1558, and has been read back.    HCT 15.8 (L) 39.0 - 52.0 %   MCV 92.4 80.0 - 100.0 fL   MCH 28.1 26.0 - 34.0 pg   MCHC 30.4 30.0 - 36.0 g/dL   RDW 15.3 11.5 - 15.5 %   Platelets 136 (L) 150 - 400 K/uL   nRBC 0.0 0.0 - 0.2 %   Neutrophils Relative % 71 %   Neutro Abs 4.8 1.7 - 7.7 K/uL   Lymphocytes Relative 14 %   Lymphs Abs 0.9 0.7 - 4.0 K/uL   Monocytes Relative 6 %   Monocytes Absolute 0.4 0.1 - 1.0 K/uL   Eosinophils Relative 7 %   Eosinophils Absolute 0.5 0.0 - 0.5 K/uL   Basophils Relative 0 %   Basophils Absolute 0.0 0.0 - 0.1 K/uL   Immature Granulocytes 2 %   Abs Immature Granulocytes 0.13 (H) 0.00 - 0.07 K/uL    Comment: Performed at [facility] [address], [place], [place] [address]  Comprehensive metabolic panel     Status: Abnormal   Collection Time: 07/06/18  3:17 PM  Result Value Ref Range   Sodium 139 135 - 145 mmol/L   Potassium 4.1 3.5 - 5.1 mmol/L   Chloride 108 98 - 111 mmol/L   CO2 22 22 - 32 mmol/L   Glucose, Bld 247 (H) 70 - 99 mg/dL  BUN 86 (H) 8 - 23 mg/dL   Creatinine, Ser 3.53 (H) 0.61 - 1.24 mg/dL   Calcium 8.4 (L) 8.9 - 10.3 mg/dL   Total Protein 5.2 (L) 6.5 - 8.1 g/dL   Albumin 2.9 (L) 3.5 - 5.0 g/dL   AST 9 (L) 15 - 41 U/L   ALT 9 0 - 44 U/L   Alkaline Phosphatase 81 38 - 126 U/L   Total Bilirubin 0.3 0.3 - 1.2 mg/dL   GFR calc non Af Amer 17 (L) >60 mL/min   GFR calc Af Amer 20 (L) >60 mL/min   Anion gap 9 5 - 15    Comment: Performed at [facility] [address] [place], [place] [address]  Occult bld gastric/duodenum (cup to lab)     Status: Abnormal   Collection Time: 07/06/18  3:30 PM  Result Value Ref Range   pH, Gastric NOT DONE    Occult Blood, Gastric POSITIVE (A) NEGATIVE    Comment: Performed at [facility], [place] [address], [place], [place] [address]   *Note: Due to a large number of results and/or encounters for the requested time period, some results have not been displayed. A complete set of results can be found in Results Review.   No results found.  Pending Labs Unresulted Labs (From admission, onward)    Start     Ordered   07/07/18 0500  CBC  Tomorrow morning,   R     07/06/18 1727   07/07/18 3785  Basic metabolic panel  Tomorrow morning,   R     07/06/18 1727   07/06/18 1606  Prepare RBC  (Adult Blood Administration - Red Blood Cells)  Once,   R    Question Answer Comment  # of Units 2 units   Transfusion Indications Actively Bleeding / GI Bleed   Number of Units to Keep Ahead 2 units ahead   If emergent release call blood bank Not emergent release   Instructions: Transfuse      07/06/18 1605   07/06/18 1605  Type and screen [place]  Once,   R    Comments:  [place]    07/06/18 1605   07/06/18 1518  SARS Coronavirus 2 (CEPHEID - Performed in [facility] hospital lab), Hosp Order  (Asymptomatic Patients Labs)  Once,   R    Question:  Rule Out  Answer:  Yes   07/06/18 1517   07/06/18 1517  Urinalysis, Routine w  reflex microscopic  Once,   R     07/06/18 1517          Vitals/Pain Today's Vitals   07/06/18 1700 07/06/18 1715 07/06/18 1730 07/06/18 1745  BP: (!) 118/51 (!) 134/54 (!) 133/53 (!) 142/54  Pulse:      Resp: 19 19 20 18   Temp:      TempSrc:      SpO2:      Weight:      Height:      PainSc:        Isolation Precautions No active isolations  Medications Medications  0.9 %  sodium chloride infusion (Manually program via Guardrails IV Fluids) (has no administration in time range)  acetaminophen (TYLENOL) tablet 650 mg (has no administration in time range)  atorvastatin (LIPITOR) tablet 40 mg (has no administration in time range)  cloNIDine (CATAPRES - Dosed in mg/24 hr) patch 0.2 mg (has no administration in time range)  doxazosin (  CARDURA) tablet 8 mg (has no administration in time range)  levothyroxine (SYNTHROID) tablet 100 mcg (has no administration in time range)  ferrous sulfate tablet 325 mg (has no administration in time range)  cholecalciferol (VITAMIN D) tablet 2,000 Units (has no administration in time range)  feeding supplement (ENSURE ENLIVE) (ENSURE ENLIVE) liquid 237 mL (has no administration in time range)  ipratropium-albuterol (DUONEB) 0.5-2.5 (3) MG/3ML nebulizer solution 3 mL (has no administration in time range)  Glycopyrrolate SOLN 1 mL (has no administration in time range)  ondansetron (ZOFRAN) tablet 4 mg (has no administration in time range)    Or  ondansetron (ZOFRAN) injection 4 mg (has no administration in time range)  pantoprazole (PROTONIX) 80 mg in sodium chloride 0.9 % 100 mL IVPB (has no administration in time range)    Mobility non-ambulatory High fall risk   Focused Assessments Cardiac Assessment Handoff:    Lab Results  Component Value Date   TROPONINI <0.03 06/23/2018   No results found for: DDIMER Does the Patient currently have chest pain? No     R Recommendations: See Admitting Provider Note  Report given to:    Additional Notes:

## 2018-07-06 NOTE — Progress Notes (Signed)
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  Cancer Staging Staging form: Stomach, AJCC 8th Edition - Clinical: Stage I (cT1, cN0, cM0) - Signed by Heath Lark, MD on 04/14/2018  EUS 05/31/2018: Confirmed T3N0 disease.    PET 6/38/7564: Hypermetabolic activity in the distal stomach and proximal duodenum, presumably corresponding with the recently diagnosed gastric cancer.  No definite surrounding hypermetabolic nodal activity or distal metastases.  CT Chest 03/31/2018: Posterior left upper lobe 6 mm nodule is indeterminate but felt to be similar on 07/11/2017.  Right lateral chest wall lesion is similar too on the prior exam.  Upper GI endoscopy 03/29/2018:Large ulcerated lesion in the gastric antrum at the level of the pylorus measuring about 25 mm.  No gross lesions in esophagus, non-bleeding erosive gastropathy.  Congested, erythematous, friable, granular inflamed and texture changed mucosa in the prepyloric region of the stomach and pylorus.  Currently non-bleeding gastric ulcer with a possible flat pigmented spot that encircled the entire pylorus.  Biopsy of the stomach 05/31/2018   Biopsies of Stomach 03/29/2018   Past/Anticipated interventions by surgeon, if any:  [name] 07/02/2018 -If he has a reasonable cardiac evaluation and does not have extensive cancer based on an endoscopic ultrasound, I do not think it would be unreasonable to do a distal gastrectomy. -I discussed that distal gastrectomy is been much better tolerated, however he is still at least about twice as high risk based on the [employer] of surgeons frailty index. -If he has early stage cancer, he would have the most again from a gastrectomy.  If he has more extensive gastric involvement or lymphatic disease, he would be less likely to benefit. -I think radiation is a  reasonable palliative option if his cardiac risk is prohibitive or if his cancer is more extensive than just the pylorus. -We will make a decision once we have this information back.   Past/Anticipated interventions by medical oncology, if any:  [name] 04/14/2018 -I have reviewed CT imaging and pathology report with the patient and family -The patient has no signs of metastatic spread of cancer.  His case was recently discussed at GI tumor board -The surgeon who saw him in the hospital was concerned about comorbidities and high risk of mortality.  We discussed potential referral to a different surgeon or second opinion at tertiary center. -He has appointment to meet with radiation oncologist tomorrow to discuss the role of radiation therapy. -I will not prescribe chemotherapy for him. -I explained to the patient and family that his risk of complications from chemotherapy is very high and could potentially cause permanent renal failure.   Weight changes, if any:  Bowel/Bladder complaints, if any:   Nausea / Vomiting, if any:   Pain issues, if any:   Appetite is getting better per patient.   SAFETY ISSUES:  Prior radiation? No  Pacemaker/ICD? No  Possible current pregnancy? No  Is the patient on methotrexate? No  Current Complaints/Details: - Lives at [facility] and rehab [phone]

## 2018-07-06 NOTE — ED Provider Notes (Signed)
[facility] EMERGENCY DEPARTMENT Provider Note   CSN: [id number] Arrival date & time: 07/06/18  1415    History   Chief Complaint No chief complaint on file.   HPI Leonard [name] is a 68 y.o. male.     The history is provided by the nursing home. The history is limited by the condition of the patient. No language interpreter was used.  Emesis  Severity:  Moderate Duration:  1 hour Timing:  Constant Number of daily episodes:  2 Quality:  Coffee grounds Chronicity:  New Relieved by:  Nothing Worsened by:  Nothing Associated symptoms: no abdominal pain   Pt is here from a nursing home.  Pt has pancreatic cancer. Nurses at facility report pt has vomited x 2.   Past Medical History:  Diagnosis Date  . Acute on chronic diastolic heart failure ([place]) 07/04/2014  . Anemia of renal disease   . Atherosclerotic peripheral vascular disease with gangrene ([place]) 02/14/2017  . CAD (coronary artery disease) 04/06/2013  . Cancer ([place])    skin cancer-  . CHF (congestive heart failure) ([place])   . Chronic anemia 02/28/2016  . Chronic deep vein thrombosis (DVT) of femoral vein of left lower extremity ([place]) 09/03/2016  . Chronic respiratory failure with hypoxia ([place]) 08/25/2017  . CKD (chronic kidney disease) stage 4, GFR 15-29 ml/min (HCC) 01/15/2015   Follows with [facility].  Last visits:  07/23/16: No change in meds. Worried that if we increase diuretics could lead to another AKI  . CKD (chronic kidney disease), stage IV ([place])    07/07/2017  . Claudication ([place])   . Collapse of left talus 01/14/2017  . COPD GOLD III  02/16/2014   Quit smoking 2014  - PFT's  04/06/13   FEV1 1.28 (44 % ) ratio 48  p no % improvement from saba p ?  prior to study with DLCO  43/54 % corrects to 97  % for alv volume - 01/31/2016  After extensive coaching HFA effectiveness =    75% from baseline 0 > changed to sprivia respimat   - PFT's  03/17/2016  FEV1 2.25  (81 % ) ratio 88  p 2 % improvement from  saba p spiriva respimat x 2  prior to study with  . Coronary artery disease   . Diabetes ([place])   . Diabetic polyneuropathy associated with type 2 diabetes mellitus ([place]) 01/14/2017  . Diabetic retinopathy ([place]) 11/13/2015   Per Dr. Erenest Rasher eye exam in 10/17/15, mild background diabetic retinopathy  . Fatigue associated with anemia 01/21/2016  . Gangrene of toe ([place])    right 5th/notes 01/04/2013   . HCAP (healthcare-associated pneumonia)   . Hematuria 06/27/2015  . High cholesterol   . History of tobacco use 08/23/2013   Smoked pack per day for 46 years Quit smoking - 02/03/2013   . Hypertension   . Hypothyroidism 11/22/2014  . Idiopathic chronic gout of left foot without tophus   . Iron deficiency anemia 08/03/2015  . Left foot pain 10/29/2016  . Long term (current) use of anticoagulants [Z79.01] 09/09/2016  . 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 Mixed hyperlipidemia 07/17/2008  . Morbid obesity due to excess calories ([place]) 03/17/2016  . PAD (peripheral artery disease) ([place])   . Pleural effusion   . Pneumonitis 07/26/2017  . Pulmonary artery hypertension ([place]) 07/25/2017  . PVD - hx of Rt SFA PTA and s/p Rt 4-5th toe amp Dec 2014 04/05/2013  . Resistant hypertension  11/25/2012  . Respiratory failure ([place]) 08/13/2017  . Respiratory failure with hypoxia ([place]) 09/23/2017  . 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 Secondary hypertension   . Shortness of breath dyspnea    with exertion  . Skin lesion of face 02/22/2015  . SOB (shortness of breath)   . Type 2 diabetes mellitus with pressure callus ([place]) 12/29/2016  . Type II diabetes mellitus (HCC)    Type II  . Type II diabetes mellitus with complication [facility])     Patient Active Problem List   Diagnosis Date Noted  . GI bleed 06/23/2018  . Acute renal failure superimposed on chronic kidney disease ([place])   . Abdominal pain   . Adenocarcinoma ([place])   . DNR (do not resuscitate)   . 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)    . Palliative care encounter   . Symptomatic anemia 03/29/2018  . Acute GI bleeding 03/28/2018  . Dysphagia 10/01/2017  . Palliative care by specialist   . Severe comorbid illness   . Moderate malnutrition ([place]) 08/27/2017  . Hypoxemia   . Atelectasis   . Chronic respiratory failure with hypoxia ([place]) 08/25/2017  . Pulmonary artery hypertension ([place]) 07/25/2017  . Pressure injury of skin 07/20/2017  . Goals of care, counseling/discussion 02/11/2017  . Diabetic polyneuropathy associated with type 2 diabetes mellitus ([place]) 01/14/2017  . Pleural effusion   . Chronic anemia 02/28/2016  . Diabetic retinopathy ([place]) 11/13/2015  . Iron deficiency anemia 08/03/2015  . Type II diabetes mellitus with complication ([place])   . Chronic kidney disease (CKD), stage III (moderate) (HCC)   . 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 Deficiency anemia 01/15/2015  . CKD (chronic kidney disease) stage 4, GFR 15-29 ml/min (HCC) 01/15/2015  . Hypothyroidism 11/22/2014  . COPD GOLD III  02/16/2014  . Anemia of renal disease   . Chronic diastolic CHF (congestive heart failure) ([place])   . History of tobacco use 08/23/2013  . 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 CAD (coronary artery disease) 04/06/2013  . PVD - hx of Rt SFA PTA and s/p Rt 4-5th toe amp Dec 2014 04/05/2013  . 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 Mixed hyperlipidemia 07/17/2008    Past Surgical History:  Procedure Laterality Date  . ABDOMINAL AORTOGRAM W/LOWER EXTREMITY N/A 03/12/2017   Procedure: ABDOMINAL AORTOGRAM W/LOWER EXTREMITY;  Surgeon: Conrad [place] Lake, MD;  Location: [facility] CV LAB;  Service: Cardiovascular;  Laterality: N/A;  . AMPUTATION Right 01/28/2013   Procedure: RAY AMPUTATION RIGHT  4th & 5th TOE;  Surgeon: Rosetta Posner, MD;  Location: [facility];  Service: Vascular;  Laterality: Right;  . AMPUTATION Left 03/15/2017   Procedure: AMPUTATION ABOVE KNEE;  Surgeon: Waynetta Sandy, MD;  Location: [facility];   Service: Vascular;  Laterality: Left;  . ANGIOPLASTY / STENTING FEMORAL Right 01/13/2013   superficial femoral artery x 2 (3 mm x 60 mm, 4 mm x 60 mm)  . AV FISTULA PLACEMENT Left 02/11/2016   Procedure: LEFT UPPER ARM BRACHIAL CEPHALIC ARTERIOVENOUS (AV) FISTULA CREATION;  Surgeon: Conrad Wurtland, MD;  Location: [facility];  Service: Vascular;  Laterality: Left;  . BIOPSY  03/29/2018   Procedure: BIOPSY;  Surgeon: Rush Landmark Telford Nab., MD;  Location: [facility];  Service: Gastroenterology;;  . BIOPSY  05/31/2018   Procedure: BIOPSY;  Surgeon: Irving Copas., MD;  Location: [facility];  Service: Gastroenterology;;  . COLONOSCOPY W/ POLYPECTOMY    . CORONARY ARTERY BYPASS GRAFT N/A 04/07/2013  Procedure: CORONARY ARTERY BYPASS GRAFTING (CABG);  Surgeon: Ivin Poot, MD;  Location: [facility];  Service: Open Heart Surgery;  Laterality: N/A;  . ESOPHAGOGASTRODUODENOSCOPY (EGD) WITH PROPOFOL N/A 03/29/2018   Procedure: ESOPHAGOGASTRODUODENOSCOPY (EGD) WITH PROPOFOL;  Surgeon: Rush Landmark Telford Nab., MD;  Location: [facility];  Service: Gastroenterology;  Laterality: N/A;  . ESOPHAGOGASTRODUODENOSCOPY (EGD) WITH PROPOFOL N/A 05/31/2018   Procedure: ESOPHAGOGASTRODUODENOSCOPY (EGD) WITH PROPOFOL;  Surgeon: Rush Landmark Telford Nab., MD;  Location: [facility];  Service: Gastroenterology;  Laterality: N/A;  . EUS N/A 05/31/2018   Procedure: UPPER ENDOSCOPIC ULTRASOUND (EUS) RADIAL;  Surgeon: Rush Landmark Telford Nab., MD;  Location: [facility];  Service: Gastroenterology;  Laterality: N/A;  . INTRAOPERATIVE TRANSESOPHAGEAL ECHOCARDIOGRAM N/A 04/07/2013   Procedure: INTRAOPERATIVE TRANSESOPHAGEAL ECHOCARDIOGRAM;  Surgeon: Ivin Poot, MD;  Location: [facility];  Service: Open Heart Surgery;  Laterality: N/A;  . IR FLUORO GUIDE CV LINE RIGHT  11/04/2017  . IR REMOVAL TUN CV CATH W/O FL  11/27/2017  . IR US GUIDE VASC ACCESS RIGHT  11/04/2017  . LEFT HEART CATHETERIZATION WITH CORONARY ANGIOGRAM N/A  04/05/2013   Procedure: LEFT HEART CATHETERIZATION WITH CORONARY ANGIOGRAM;  Surgeon: Peter M Martinique, MD;  Location: [facility] CATH LAB;  Service: Cardiovascular;  Laterality: N/A;  . LOWER EXTREMITY ANGIOGRAM Bilateral 01/06/2013   Procedure: LOWER EXTREMITY ANGIOGRAM;  Surgeon: Conrad Struble, MD;  Location[facility] C[facility];  Service: Cardiovascular;  Laterality: Bilateral;  . LOWER EXTREMITY ANGIOGRAM Right 01/13/2013   Procedure: LOWER EXTREMITY ANGIOGRAM;  Surgeon: Conrad Pineland, MD;  Location: [facility] CATH LAB;  Service: Cardiovascular;  Laterality: Right;  . SKIN CANCER EXCISION  2017  . THROAT SURGERY  1983   polyps  . TONSILLECTOMY AND ADENOIDECTOMY  ~ 1965        Home Medications    Prior to Admission medications   Medication Sig Start Date End Date Taking? Authorizing Provider  acetaminophen (TYLENOL) 325 MG tablet Take 650 mg by mouth every 8 (eight) hours as needed for mild pain or fever.     [provider]  amLODipine (NORVASC) 10 MG tablet Take 1 tablet (10 mg total) by mouth daily. 06/11/18   Larey Dresser, MD  atorvastatin (LIPITOR) 40 MG tablet TAKE ONE (1) TABLET BY MOUTH EVERY DAY AT 6:00PM Patient taking differently: Take 40 mg by mouth daily at 6 PM.  11/17/16   Larey Dresser, MD  Blood Glucose Monitoring Suppl ([place]) w/Device KIT 1 kit by Does not apply route daily. 10/10/16   McDiarmid, Blane Ohara, MD  carvedilol (COREG) 12.5 MG tablet Take 1 tablet (12.5 mg total) by mouth 2 (two) times daily with a meal. 08/30/17   Meccariello, Bernita Raisin, DO  cholecalciferol (VITAMIN D) 25 MCG (1000 UT) tablet Take 2,000 Units by mouth 3 (three) times daily.    [provider]  cloNIDine (CATAPRES - DOSED IN MG/24 HR) 0.2 mg/24hr patch Place 1 patch (0.2 mg total) onto the skin once a week. Patient taking differently: Place 0.2 mg onto the skin every Tuesday.  08/09/17   Steve Rattler, DO  Darbepoetin Alfa (ARANESP) 40 MCG/0.4ML SOSY injection Inject 0.4 mLs (40  mcg total) into the skin every 30 (thirty) days. 09/28/17   Meccariello, Bernita Raisin, DO  doxazosin (CARDURA) 8 MG tablet Take 1 tablet (8 mg total) by mouth every evening. 12/01/16   Zenia Resides, MD  feeding supplement, ENSURE ENLIVE, (ENSURE ENLIVE) LIQD Take 237 mLs by mouth 3 (three) times  daily between meals. 08/07/17   Steve Rattler, DO  ferrous sulfate (FEROSUL) 325 (65 FE) MG tablet Take 1 tablet (325 mg total) by mouth daily at 12 noon. Patient taking differently: Take 325 mg by mouth daily.  08/31/17   Meccariello, Bernita Raisin, DO  glucose blood (ONETOUCH VERIO) test strip Use as instructed to test three times daily. ICD-10 code: E11.22 10/15/16   McDiarmid, Blane Ohara, MD  hydrALAZINE (APRESOLINE) 100 MG tablet TAKE ONE TABLET BY MOUTH EVERY EIGHT HOURS Patient taking differently: Take 100 mg by mouth every 8 (eight) hours.  11/14/16   [name], MD  insulin aspart (NOVOLOG) 100 UNIT/ML injection Inject 0-10 Units into the skin See admin instructions. Three times daily per sliding scale: <150 = 0 units 151-200 = 2 units 201-250 = 4 units 251-300 = 6 units 301-350 = 8 units 351-400 = 10 units Call PCP if CBG <70 or >400    [provider]  ipratropium-albuterol (DUONEB) 0.5-2.5 (3) MG/3ML SOLN Take 3 mLs by nebulization every 6 (six) hours as needed (shortness of breath and wheezing).     [provider]  isosorbide mononitrate (IMDUR) 120 MG 24 hr tablet Take 1 tablet (120 mg total) by mouth daily. 10/02/17   Anderson, Chelsey L, DO  levothyroxine (SYNTHROID, LEVOTHROID) 100 MCG tablet Take 100 mcg by mouth daily before breakfast.     [provider]  nitroGLYCERIN (NITROSTAT) 0.4 MG SL tablet Place 1 tablet (0.4 mg total) under the tongue every 5 (five) minutes as needed for chest pain. 11/14/16   [name], MD  [facility] DELICA LANCETS 45Y MISC 1 each by Does not apply route 3 (three) times daily. ICD-10 code: E11.22 10/15/16   McDiarmid, Blane Ohara, MD  pantoprazole (PROTONIX) 40 MG tablet Take 1 tablet (40 mg total) by mouth 2 (two) times daily. 04/03/18   Patriciaann Clan, DO  polyethylene glycol (MIRALAX / GLYCOLAX) packet Take 17 g by mouth daily as needed for mild constipation or moderate constipation. Patient taking differently: Take 17 g by mouth daily as needed for mild constipation.  08/07/17   Steve Rattler, DO  potassium chloride SA (K-DUR,KLOR-CON) 20 MEQ tablet Take 2 tablets (40 mEq total) by mouth daily. 02/16/18   Larey Dresser, MD  torsemide (DEMADEX) 20 MG tablet Take 2 tablets (40 mg total) by mouth daily. 02/16/18   Larey Dresser, MD  umeclidinium bromide (INCRUSE ELLIPTA) 62.5 MCG/INH AEPB Inhale 1 puff into the lungs daily. 06/28/18   Mullis, Archie Endo, DO    Family History Family History  Problem Relation Age of Onset  . Cancer Mother        lung cancer  . Hypertension Mother   . Cancer Sister        patient thinks it was uterine cancer  . Hypertension Sister   . Heart attack Sister   . Stroke Neg Hx     Social History Social History   Tobacco Use  . Smoking status: Former Smoker    Packs/day: 1.00    Years: 46.00    Pack years: 46.00    Types: Cigarettes    Start date: 02/04/1967    Last attempt to quit: 02/03/2013    Years since quitting: 5.4  . Smokeless tobacco: Never Used  Substance Use Topics  . Alcohol use: No    Alcohol/week: 0.0 standard drinks  . Drug use: No     Allergies   Penicillins  Review of Systems Review of Systems  Gastrointestinal: Positive for vomiting. Negative for abdominal pain.  All other systems reviewed and are negative.    Physical Exam Updated Vital Signs BP (!) 140/40   Pulse 72   Temp 98.3 F (36.8 C) (Oral)   Resp 16   Ht '5\' 8"'  (1.727 m)   Wt 78.5 kg   SpO2 98%   BMI 26.30 kg/m   Physical Exam Vitals signs and nursing note reviewed.  Constitutional:      Appearance: He is well-developed.  HENT:     Head: Normocephalic and  atraumatic.     Mouth/Throat:     Pharynx: Oropharynx is clear.  Eyes:     Conjunctiva/sclera: Conjunctivae normal.  Neck:     Musculoskeletal: Neck supple.  Cardiovascular:     Rate and Rhythm: Normal rate and regular rhythm.     Heart sounds: No murmur.  Pulmonary:     Effort: Pulmonary effort is normal. No respiratory distress.     Breath sounds: Normal breath sounds.  Abdominal:     General: Abdomen is flat.     Palpations: Abdomen is soft.     Tenderness: There is no abdominal tenderness.  Skin:    General: Skin is warm and dry.  Neurological:     General: No focal deficit present.     Mental Status: He is alert.      ED Treatments / Results  Labs (all labs ordered are listed, but only abnormal results are displayed) Labs Reviewed  SARS CORONAVIRUS 2 ([facility])  CBC WITH DIFFERENTIAL/PLATELET  COMPREHENSIVE METABOLIC PANEL  URINALYSIS, ROUTINE W REFLEX MICROSCOPIC  POCT GASTRIC OCCULT BLOOD (1-CARD TO LAB)    EKG None  Radiology No results found.  Procedures .Critical Care Performed by: Fransico Meadow, PA-C Authorized by: Fransico Meadow, PA-C   Critical care provider statement:    Critical care time (minutes):  45   Critical care start time:  07/06/2018 3:30 PM   Critical care end time:  07/06/2018 5:04 PM   Critical care was necessary to treat or prevent imminent or life-threatening deterioration of the following conditions:  Cardiac failure, circulatory failure and renal failure   Critical care was time spent personally by me on the following activities:  Discussions with consultants, evaluation of patient's response to treatment, examination of patient, ordering and performing treatments and interventions, ordering and review of laboratory studies, ordering and review of radiographic studies, pulse oximetry, re-evaluation of patient's condition, obtaining history from patient or surrogate and review of old charts    I assumed direction of critical care for this patient from another provider in my specialty: no     (including critical care time)  Medications Ordered in ED Medications - No data to display   Initial Impression / Assessment and Plan / ED Course  I have reviewed the triage vital signs and the nursing notes.  Pertinent labs & imaging results that were available during my care of the patient were reviewed by me and considered in my medical decision making (see chart for details).        MDM  Pt arouses to touch.  Vitals stable.  Family practice will see for admission  Final Clinical Impressions(s) / ED Diagnoses   Final diagnoses:  Anemia, unspecified type  Gastrointestinal hemorrhage associated with acute gastritis    ED Discharge Orders    None       Fransico Meadow,  PA-C 07/06/18 1706    Pattricia Boss, MD 07/12/18 1547

## 2018-07-06 NOTE — H&P (Addendum)
[facility] Admission History and Physical Service Pager: [phone]  [name] Medical record number: [id number] Date of birth: 04-08-1950 Age: 68 y.o. Gender: male  Primary Care Provider: Kathrene Alu, MD Consultants: GI Code Status: FULL Preferred contact: Zettie Pho  Chief Complaint: Bloody vomit  Assessment and Plan: Leonard [name] is a 68 y.o. male presenting with bloody emesis and hemoglobin of 4.8.  His previous medical history significant for gastric adenocarcinoma, heart failure with preserved ejection fraction, recurrent GI bleed, CKD, hypertension, hypothyroidism, anemia.  Upper GI bleed likely secondary to gastric cancer [name] has a history of GI bleed secondary to gastric cancer.  He was last admitted to the hospital on 5/20 for GI bleed of similar presentation.  Today he reports 2 episodes of hematemesis and significant lethargy.  His physical exam was remarkable for global pallor and significant fatigue WITHOUT abdominal tenderness.  His vital signs were notable for diastolic blood pressures between 40 and 54, however systolics were within normal limites. He also had a normal heart rate and respiratory rate.  Admission labs were significant for hemoglobin 4.8 (8.1 on 06/27/2018), Hemoccult positive, Cr 3.53.  Given his history of GI bleeding, gastric cancer and hematemesis, it is overwhelmingly likely that he is having an upper GI bleed.  Transfusion was started in the ED and he was admitted to the floor for further management. -Admit to [place] [facility], attending Dr. Mingo Amber -Consult GI -Transfused 2 units PRBCs started in the ED - trend serial hemoglobin -Consider Lasix following second unit -Protonix IV -2 large-bore IVs -Monitor vitals every 4 hours -Holding ASA -N.p.o.  Diet controlled DMT2 HgA1C 6.5 in 06/2018.  - monitor CBGs  Acute on CKD stage IV Baseline creatinine 3.0 (06/27/2018) creatinine on admission  3.53.  Likely mildly worsened in the setting of low circulating volume.  We will continue to monitor daily BMPs. -Daily BMP  Gastric adenocarcinoma Per oncology note on 6/2, stage I, T2N0M0 adenocarcinoma of the stomach.  Per this note, his healthcare power of attorney has agreed to move forward with radiotherapy with oncology. -Continue to follow outpatient with oncology  Heart failure with preserved ejection fraction, chronic Echocardiogram on 09/24/2016 shows an EF of 50 to 55% with moderate to severe LVH.  He does not appear fluid overloaded on exam today.  He has no complaints of shortness of breath or leg swelling.  Is on medication includes carvedilol 12.5 mg twice daily, Imdur 120 mg daily, torsemide 40 mg daily, potassium supplements 40 mEq daily. -Holding Coreg due to low blood pressures -Hold Imdur due to low blood pressures -Holding torsemide due to low volume status and low blood pressures -Holding potassium because the torsemide is being held  Hypertension, chronic Blood pressure on admission was notable for systolic pressures from 027-253 with diastolic pressures from 66-44.  Home medication includes amlodipine 10 mg, clonidine patch 0.2 mg, hydralazine 100 mg every 8 hours. -Continue clonidine to avoid rebound hypertension -Holding amlodipine and hydralazine due to low diastolic pressures  Chronic anemia Chronic anemia is likely related to iron deficiency from blood loss in addition to chronic disease related to his CKD.  He does take iron supplements at home.  Last hemoglobin per chart review was 8.1 on 06/27/2018. -Continue iron supplements once taking p.o.  CAD s/p CABG Her medication includes aspirin 81 mg daily and atorvastatin. -Continue atorvastatin -Hold aspirin in the setting of GI bleed  Hypothyroidism Home medication includes Synthroid 100 mcg daily. -Continue  Synthroid 100 mg daily once taking p.o.  COPD Home medication includes glycopyrrolate, and duo nebs  as needed. -Continue glycopyrrolate -Duo nebs PRN  BPH Medication includes doxazosin. -Continue doxazosin though sitter holding blood pressure remains low  FEN/GI: NPO Prophylaxis: SCD (holding anticoagulation due to GI bleed)  Disposition: Pending evaluation of GI bleed and possible intervention.  History of Present Illness:  Leonard [name] is a 68 y.o. male presenting with bloody emesis and hemoglobin of 4.8.  His previous medical history significant for gastric adenocarcinoma, heart failure with preserved ejection fraction, recurrent GI bleed, CKD, hypertension, hypothyroidism, anemia.  Per ED provider report and chart review, he was seen earlier in the day at his oncology appointment and at that time was found to have had multiple episodes of emesis with blood.  He was encouraged to go to the ED for further evaluation.  In the ED, he is significantly fatigued not interested in a long conversation.  He did verify that he had 2 episodes of bloody emesis earlier in the day.  He denies bloody bowel movements.  He reports no additional complaints at this time.  He specifically denies nausea, abdominal pain, bloody bowel movements, chest pain, shortness of breath.  He is found to have a hemoglobin of 4.8, 2 units RBCs ordered in the ED.  Review Of Systems: Per HPI with the following additions:   Review of Systems  Constitutional: Positive for malaise/fatigue. Negative for fever.  HENT: Negative for congestion and sore throat.   Respiratory: Negative for cough and shortness of breath.   Cardiovascular: Negative for chest pain, palpitations and leg swelling.  Gastrointestinal: Positive for vomiting. Negative for abdominal pain and nausea.  Genitourinary: Negative for frequency.  Skin: Negative for rash.    Patient Active Problem List   Diagnosis Date Noted  . GI bleed 06/23/2018  . Acute renal failure superimposed on chronic kidney disease ([place])   . Abdominal pain   .  Adenocarcinoma ([place])   . DNR (do not resuscitate)   . 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 Palliative care encounter   . Symptomatic anemia 03/29/2018  . Acute GI bleeding 03/28/2018  . Dysphagia 10/01/2017  . Palliative care by specialist   . Severe comorbid illness   . Moderate malnutrition ([place]) 08/27/2017  . Hypoxemia   . Atelectasis   . Chronic respiratory failure with hypoxia ([place]) 08/25/2017  . Pulmonary artery hypertension ([place]) 07/25/2017  . Pressure injury of skin 07/20/2017  . Goals of care, counseling/discussion 02/11/2017  . Diabetic polyneuropathy associated with type 2 diabetes mellitus ([place]) 01/14/2017  . Pleural effusion   . Chronic anemia 02/28/2016  . Diabetic retinopathy ([place]) 11/13/2015  . Iron deficiency anemia 08/03/2015  . Type II diabetes mellitus with complication ([place])   . Chronic kidney disease (CKD), stage III (moderate) (HCC)   . 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 Deficiency anemia 01/15/2015  . CKD (chronic kidney disease) stage 4, GFR 15-29 ml/min (HCC) 01/15/2015  . Hypothyroidism 11/22/2014  . COPD GOLD III  02/16/2014  . Anemia of renal disease   . Chronic diastolic CHF (congestive heart failure) ([place])   . History of tobacco use 08/23/2013  . 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 CAD (coronary artery disease) 04/06/2013  . PVD - hx of Rt SFA PTA and s/p Rt 4-5th toe amp Dec 2014 04/05/2013  . 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 Mixed hyperlipidemia 07/17/2008    Past Medical History: Past Medical  History:  Diagnosis Date  . Acute on chronic diastolic heart failure ([place]) 07/04/2014  . Anemia of renal disease   . Atherosclerotic peripheral vascular disease with gangrene ([place]) 02/14/2017  . CAD (coronary artery disease) 04/06/2013  . Cancer ([place])    skin cancer-  . CHF (congestive heart failure) ([place])   . Chronic anemia 02/28/2016  . Chronic deep vein thrombosis (DVT) of femoral vein of left lower extremity  ([place]) 09/03/2016  . Chronic respiratory failure with hypoxia ([place]) 08/25/2017  . CKD (chronic kidney disease) stage 4, GFR 15-29 ml/min (HCC) 01/15/2015   Follows with [facility].  Last visits:  07/23/16: No change in meds. Worried that if we increase diuretics could lead to another AKI  . CKD (chronic kidney disease), stage IV ([place])    07/07/2017  . Claudication ([place])   . Collapse of left talus 01/14/2017  . COPD GOLD III  02/16/2014   Quit smoking 2014  - PFT's  04/06/13   FEV1 1.28 (44 % ) ratio 48  p no % improvement from saba p ?  prior to study with DLCO  43/54 % corrects to 97  % for alv volume - 01/31/2016  After extensive coaching HFA effectiveness =    75% from baseline 0 > changed to sprivia respimat   - PFT's  03/17/2016  FEV1 2.25  (81 % ) ratio 88  p 2 % improvement from saba p spiriva respimat x 2  prior to study with  . Coronary artery disease   . Diabetes ([place])   . Diabetic polyneuropathy associated with type 2 diabetes mellitus ([place]) 01/14/2017  . Diabetic retinopathy ([place]) 11/13/2015   Per Dr. Erenest Rasher eye exam in 10/17/15, mild background diabetic retinopathy  . Fatigue associated with anemia 01/21/2016  . Gangrene of toe ([employer])    right 5th/notes 01/04/2013   . HCAP (healthcare-associated pneumonia)   . Hematuria 06/27/2015  . High cholesterol   . History of tobacco use 08/23/2013   Smoked pack per day for 46 years Quit smoking - 02/03/2013   . Hypertension   . Hypothyroidism 11/22/2014  . Idiopathic chronic gout of left foot without tophus   . Iron deficiency anemia 08/03/2015  . Left foot pain 10/29/2016  . Long term (current) use of anticoagulants [Z79.01] 09/09/2016  . 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 Mixed hyperlipidemia 07/17/2008  . Morbid obesity due to excess calories ([place]) 03/17/2016  . PAD (peripheral artery disease) ([place])   . Pleural effusion   . Pneumonitis 07/26/2017  . Pulmonary artery hypertension ([place]) 07/25/2017  . PVD - hx of Rt  SFA PTA and s/p Rt 4-5th toe amp Dec 2014 04/05/2013  . 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 Respiratory failure ([place]) 08/13/2017  . Respiratory failure with hypoxia ([place]) 09/23/2017  . 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 Secondary hypertension   . Shortness of breath dyspnea    with exertion  . Skin lesion of face 02/22/2015  . SOB (shortness of breath)   . Type 2 diabetes mellitus with pressure callus ([place]) 12/29/2016  . Type II diabetes mellitus (HCC)    Type II  . Type II diabetes mellitus with complication [facility])     Past Surgical History: Past Surgical History:  Procedure Laterality Date  . ABDOMINAL AORTOGRAM W/LOWER EXTREMITY N/A 03/12/2017   Procedure: ABDOMINAL AORTOGRAM W/LOWER EXTREMITY;  Surgeon: Conrad [place], MD;  Location: [facility];  Service: Cardiovascular;  Laterality: N/A;  . AMPUTATION Right  01/28/2013   Procedure: RAY AMPUTATION RIGHT  4th & 5th TOE;  Surgeon: Rosetta Posner, MD;  Location: [facility];  Service: Vascular;  Laterality: Right;  . AMPUTATION Left 03/15/2017   Procedure: AMPUTATION ABOVE KNEE;  Surgeon: Waynetta Sandy, MD;  Location: [facility];  Service: Vascular;  Laterality: Left;  . ANGIOPLASTY / STENTING FEMORAL Right 01/13/2013   superficial femoral artery x 2 (3 mm x 60 mm, 4 mm x 60 mm)  . AV FISTULA PLACEMENT Left 02/11/2016   Procedure: LEFT UPPER ARM BRACHIAL CEPHALIC ARTERIOVENOUS (AV) FISTULA CREATION;  Surgeon: Conrad [place], MD;  Location: [facility];  Service: Vascular;  Laterality: Left;  . BIOPSY  03/29/2018   Procedure: BIOPSY;  Surgeon: Rush Landmark Telford Nab., MD;  Location: [facility];  Service: Gastroenterology;;  . BIOPSY  05/31/2018   Procedure: BIOPSY;  Surgeon: Irving Copas., MD;  Location: [facility];  Service: Gastroenterology;;  . COLONOSCOPY W/ POLYPECTOMY    . CORONARY ARTERY BYPASS GRAFT N/A 04/07/2013   Procedure: CORONARY ARTERY BYPASS GRAFTING (CABG);  Surgeon: Ivin Poot, MD;  Location: [facility];  Service: Open Heart  Surgery;  Laterality: N/A;  . ESOPHAGOGASTRODUODENOSCOPY (EGD) WITH PROPOFOL N/A 03/29/2018   Procedure: ESOPHAGOGASTRODUODENOSCOPY (EGD) WITH PROPOFOL;  Surgeon: Rush Landmark Telford Nab., MD;  Location: [facility];  Service: Gastroenterology;  Laterality: N/A;  . ESOPHAGOGASTRODUODENOSCOPY (EGD) WITH PROPOFOL N/A 05/31/2018   Procedure: ESOPHAGOGASTRODUODENOSCOPY (EGD) WITH PROPOFOL;  Surgeon: Rush Landmark Telford Nab., MD;  Location: [facility];  Service: Gastroenterology;  Laterality: N/A;  . EUS N/A 05/31/2018   Procedure: UPPER ENDOSCOPIC ULTRASOUND (EUS) RADIAL;  Surgeon: Rush Landmark Telford Nab., MD;  Location: [facility];  Service: Gastroenterology;  Laterality: N/A;  . INTRAOPERATIVE TRANSESOPHAGEAL ECHOCARDIOGRAM N/A 04/07/2013   Procedure: INTRAOPERATIVE TRANSESOPHAGEAL ECHOCARDIOGRAM;  Surgeon: Ivin Poot, MD;  Location: [facility];  Service: Open Heart Surgery;  Laterality: N/A;  . IR FLUORO GUIDE CV LINE RIGHT  11/04/2017  . IR REMOVAL TUN CV CATH W/O FL  11/27/2017  . IR US GUIDE VASC ACCESS RIGHT  11/04/2017  . LEFT HEART CATHETERIZATION WITH CORONARY ANGIOGRAM N/A 04/05/2013   Procedure: LEFT HEART CATHETERIZATION WITH CORONARY ANGIOGRAM;  Surgeon: Peter M Martinique, MD;  Location: [facility] CATH LAB;  Service: Cardiovascular;  Laterality: N/A;  . LOWER EXTREMITY ANGIOGRAM Bilateral 01/06/2013   Procedure: LOWER EXTREMITY ANGIOGRAM;  Surgeon: Conrad [place], MD;  Location: [facility] CATH LAB;  Service: Cardiovascular;  Laterality: Bilateral;  . LOWER EXTREMITY ANGIOGRAM Right 01/13/2013   Procedure: LOWER EXTREMITY ANGIOGRAM;  Surgeon: Conrad L[place], MD;  Location: [facility] CATH LAB;  Service: Cardiovascular;  Laterality: Right;  . SKIN CANCER EXCISION  2017  . THROAT SURGERY  1983   polyps  . TONSILLECTOMY AND ADENOIDECTOMY  ~ 1965    Social History: Social History   Tobacco Use  . Smoking status: Former Smoker    Packs/day: 1.00    Years: 46.00    Pack years: 46.00    Types: Cigarettes    Start  date: 02/04/1967    Last attempt to quit: 02/03/2013    Years since quitting: 5.4  . Smokeless tobacco: Never Used  Substance Use Topics  . Alcohol use: No    Alcohol/week: 0.0 standard drinks  . Drug use: No   Additional social history: Lives at [facility] Please also refer to relevant sections of EMR.  Family History: Family History  Problem Relation Age of Onset  . Cancer Mother  lung cancer  . Hypertension Mother   . Cancer Sister        patient thinks it was uterine cancer  . Hypertension Sister   . Heart attack Sister   . Stroke Neg Hx     Allergies and Medications: Allergies  Allergen Reactions  . Penicillins Other (See Comments)    Has patient had a PCN reaction causing immediate rash, facial/tongue/throat swelling, SOB or lightheadedness with hypotension: Unknown Has patient had a PCN reaction causing severe rash involving mucus membranes or skin necrosis: Unknown Has patient had a PCN reaction that required hospitalization: Unknown Has patient had a PCN reaction occurring within the last 10 years: Unknown If all of the above answers are "NO", then may proceed with Cephalosporin use.    Current Facility-Administered Medications on File Prior to Encounter  Medication Dose Route Frequency Provider Last Rate Last Dose  . sodium chloride flush (NS) 0.9 % injection 10 mL  10 mL Intracatheter PRN Heath Lark, MD       Current Outpatient Medications on File Prior to Encounter  Medication Sig Dispense Refill  . acetaminophen (TYLENOL) 325 MG tablet Take 650 mg by mouth every 8 (eight) hours as needed for mild pain or fever.     Marland Kitchen amLODipine (NORVASC) 10 MG tablet Take 1 tablet (10 mg total) by mouth daily. (Patient taking differently: Take 5 mg by mouth daily. ) 90 tablet 3  . aspirin EC 81 MG tablet Take 81 mg by mouth daily.    Marland Kitchen atorvastatin (LIPITOR) 40 MG tablet TAKE ONE (1) TABLET BY MOUTH EVERY DAY AT 6:00PM (Patient taking differently: Take 40 mg by mouth  daily at 6 PM. ) 90 tablet 3  . carvedilol (COREG) 12.5 MG tablet Take 1 tablet (12.5 mg total) by mouth 2 (two) times daily with a meal. 60 tablet 0  . cholecalciferol (VITAMIN D) 25 MCG (1000 UT) tablet Take 2,000 Units by mouth 3 (three) times daily.    . cloNIDine (CATAPRES - DOSED IN MG/24 HR) 0.2 mg/24hr patch Place 1 patch (0.2 mg total) onto the skin once a week. (Patient taking differently: Place 0.2 mg onto the skin every Tuesday. ) 4 patch 0  . Darbepoetin Alfa (ARANESP) 40 MCG/0.4ML SOSY injection Inject 0.4 mLs (40 mcg total) into the skin every 30 (thirty) days. 8.4 mL 2  . doxazosin (CARDURA) 8 MG tablet Take 1 tablet (8 mg total) by mouth every evening. (Patient taking differently: Take 8 mg by mouth daily. )    . feeding supplement, ENSURE ENLIVE, (ENSURE ENLIVE) LIQD Take 237 mLs by mouth 3 (three) times daily between meals. 237 mL 12  . ferrous sulfate (FEROSUL) 325 (65 FE) MG tablet Take 1 tablet (325 mg total) by mouth daily at 12 noon. (Patient taking differently: Take 325 mg by mouth daily. ) 30 tablet 0  . Glycopyrrolate (LONHALA MAGNAIR STARTER KIT) 25 MCG/ML SOLN Inhale 1 mL into the lungs 2 (two) times a day.    . hydrALAZINE (APRESOLINE) 100 MG tablet TAKE ONE TABLET BY MOUTH EVERY EIGHT HOURS (Patient taking differently: Take 100 mg by mouth every 8 (eight) hours. ) 90 tablet 1  . insulin aspart (NOVOLOG) 100 UNIT/ML injection Inject 0-10 Units into the skin See admin instructions. Three times daily per sliding scale: <150 = 0 units 151-200 = 2 units 201-250 = 4 units 251-300 = 6 units 301-350 = 8 units 351-400 = 10 units Call PCP if CBG <70 or >400    .  ipratropium-albuterol (DUONEB) 0.5-2.5 (3) MG/3ML SOLN Take 3 mLs by nebulization every 6 (six) hours as needed (shortness of breath and wheezing).     . isosorbide mononitrate (IMDUR) 120 MG 24 hr tablet Take 1 tablet (120 mg total) by mouth daily. 30 tablet 0  . levothyroxine (SYNTHROID, LEVOTHROID) 100 MCG tablet  Take 100 mcg by mouth daily before breakfast.     . nitroGLYCERIN (NITROSTAT) 0.4 MG SL tablet Place 1 tablet (0.4 mg total) under the tongue every 5 (five) minutes as needed for chest pain. 30 tablet 12  . ondansetron (ZOFRAN) 4 MG tablet Take 4 mg by mouth every 8 (eight) hours as needed for nausea or vomiting.    . pantoprazole (PROTONIX) 40 MG tablet Take 1 tablet (40 mg total) by mouth 2 (two) times daily.    . polyethylene glycol (MIRALAX / GLYCOLAX) packet Take 17 g by mouth daily as needed for mild constipation or moderate constipation. (Patient taking differently: Take 17 g by mouth daily as needed for mild constipation. ) 14 each 0  . potassium chloride SA (K-DUR,KLOR-CON) 20 MEQ tablet Take 2 tablets (40 mEq total) by mouth daily. 60 tablet 11  . torsemide (DEMADEX) 20 MG tablet Take 2 tablets (40 mg total) by mouth daily. 120 tablet 11  . Blood Glucose Monitoring Suppl (ONETOUCH VERIO FLEX SYSTEM) w/Device KIT 1 kit by Does not apply route daily. 1 kit 3  . glucose blood (ONETOUCH VERIO) test strip Use as instructed to test three times daily. ICD-10 code: E11.22 100 each 12  . ONETOUCH DELICA LANCETS 94R MISC 1 each by Does not apply route 3 (three) times daily. ICD-10 code: E11.22 100 each 12  . umeclidinium bromide (INCRUSE ELLIPTA) 62.5 MCG/INH AEPB Inhale 1 puff into the lungs daily. (Patient not taking: Reported on 07/06/2018) 30 each 0    Objective: BP (!) 135/53   Pulse 75   Temp 98.3 F (36.8 C) (Oral)   Resp 18   Ht '5\' 8"'  (1.727 m)   Wt 78.5 kg   SpO2 100%   BMI 26.30 kg/m  Exam: Physical Exam Constitutional:      Appearance: He is obese. He is not diaphoretic.     Comments: Significantly fatigued on exam.  Easily arousable and able to hold a conversation with constant stimulation.  His arm was constantly rubbed in order to keep him awake.  Quickly falls back asleep without stimulation.  HENT:     Nose: Nose normal.     Mouth/Throat:     Mouth: Mucous membranes are  dry.     Pharynx: Oropharynx is clear. No oropharyngeal exudate or posterior oropharyngeal erythema.     Comments: Tongue pallor. Eyes:     General:        Right eye: No discharge.        Left eye: No discharge.     Extraocular Movements: Extraocular movements intact.     Pupils: Pupils are equal, round, and reactive to light.     Comments: Conjunctival pallor  Cardiovascular:     Rate and Rhythm: Normal rate and regular rhythm.     Heart sounds: Murmur (2/6 systolic murmur at left 2nd intercostal space) present.  Pulmonary:     Effort: Pulmonary effort is normal.     Breath sounds: Normal breath sounds. No wheezing, rhonchi or rales.  Abdominal:     General: Bowel sounds are normal. There is no distension.     Palpations: Abdomen is soft.  Tenderness: There is no abdominal tenderness. There is no guarding.     Comments: Not peritoneal  Skin:    General: Skin is warm and dry.     Coloration: Skin is pale.      Labs and Imaging: CBC BMET  Recent Labs  Lab 07/06/18 1517  WBC 6.7  HGB 4.8*  HCT 15.8*  PLT 136*   Recent Labs  Lab 07/06/18 1517  NA 139  K 4.1  CL 108  CO2 22  BUN 86*  CREATININE 3.53*  GLUCOSE 247*  CALCIUM 8.4*     Nm Myocar Multi W/spect W/wall Motion / Ef  Addendum Date: 06/25/2018    There was no ST segment deviation noted during stress.  Nuclear stress EF: 55%. The left ventricular ejection fraction is normal (55-65%).  This is a low risk study. The stress images are somewhat difficult to assess due to relatively low counts compared to the resting images . There appears to be diffuse attenuation of the entire LV but particularly in the lateral wal  The lateral wall contracts normally and his EF is normal . The patient is asymptomatic.  I think the patient is at low risk for his upcoming gastric surgery    Result Date: 06/25/2018  There was no ST segment deviation noted during stress.  Nuclear stress EF: 55%. The left ventricular  ejection fraction is normal (55-65%).  This is a low risk study. There is no evidence of ischemia and no evidence of previous infarction  The study is normal.    Dg Chest Portable 1 View  Result Date: 06/23/2018 CLINICAL DATA:  Weakness vomiting EXAM: PORTABLE CHEST 1 VIEW COMPARISON:  03/28/2018, PET-CT 04/21/2018 FINDINGS: Possible hazy infiltrate at the left base. Mild cardiomegaly. No pleural effusion or pneumothorax. IMPRESSION: Cardiomegaly.  Possible mild hazy infiltrate at the left base. Electronically Signed   By: Donavan Foil M.D.   On: 06/23/2018 21:27   Matilde Haymaker, MD 07/06/2018, 5:35 PM PGY-1, [place] Intern pager: [phone], text pages welcome  [place]   I have seen and examined this patient.    I have discussed the findings and exam with the intern and agree with the above note, which I have edited appropriately in [place]. I helped develop the management plan that is described in the resident's note, and I agree with the content.    Bonnita Hollow, MD 07/06/2018, 6:28 PM PGY-1, [place] Intern pager: [phone], text pages welcome

## 2018-07-06 NOTE — ED Notes (Signed)
Blood drawn for type and screen.  Pt remains drowsy but wakes for procedure.

## 2018-07-07 ENCOUNTER — Telehealth: Payer: Self-pay | Admitting: *Deleted

## 2018-07-07 ENCOUNTER — Ambulatory Visit
Admission: RE | Admit: 2018-07-07 | Discharge: 2018-07-07 | Disposition: A | Discharge status: Home / Self Care | Payer: Medicare Other | Source: Ambulatory Visit | Attending: Radiation Oncology | Admitting: Radiation Oncology

## 2018-07-07 DIAGNOSIS — J9611 Chronic respiratory failure with hypoxia: Secondary | ICD-10-CM | POA: Diagnosis present

## 2018-07-07 DIAGNOSIS — I5032 Chronic diastolic (congestive) heart failure: Secondary | ICD-10-CM | POA: Diagnosis present

## 2018-07-07 DIAGNOSIS — K922 Gastrointestinal hemorrhage, unspecified: Secondary | ICD-10-CM | POA: Diagnosis present

## 2018-07-07 DIAGNOSIS — I739 Peripheral vascular disease, unspecified: Secondary | ICD-10-CM | POA: Diagnosis present

## 2018-07-07 DIAGNOSIS — Z89612 Acquired absence of left leg above knee: Secondary | ICD-10-CM | POA: Diagnosis not present

## 2018-07-07 DIAGNOSIS — E039 Hypothyroidism, unspecified: Secondary | ICD-10-CM | POA: Diagnosis present

## 2018-07-07 DIAGNOSIS — M2550 Pain in unspecified joint: Secondary | ICD-10-CM | POA: Diagnosis not present

## 2018-07-07 DIAGNOSIS — C170 Malignant neoplasm of duodenum: Secondary | ICD-10-CM | POA: Diagnosis present

## 2018-07-07 DIAGNOSIS — K2901 Acute gastritis with bleeding: Secondary | ICD-10-CM | POA: Diagnosis present

## 2018-07-07 DIAGNOSIS — Z951 Presence of aortocoronary bypass graft: Secondary | ICD-10-CM | POA: Diagnosis not present

## 2018-07-07 DIAGNOSIS — Z741 Need for assistance with personal care: Secondary | ICD-10-CM | POA: Diagnosis present

## 2018-07-07 DIAGNOSIS — Z7401 Bed confinement status: Secondary | ICD-10-CM | POA: Diagnosis not present

## 2018-07-07 DIAGNOSIS — R5381 Other malaise: Secondary | ICD-10-CM | POA: Diagnosis not present

## 2018-07-07 DIAGNOSIS — N184 Chronic kidney disease, stage 4 (severe): Secondary | ICD-10-CM | POA: Diagnosis present

## 2018-07-07 DIAGNOSIS — R2689 Other abnormalities of gait and mobility: Secondary | ICD-10-CM | POA: Diagnosis present

## 2018-07-07 DIAGNOSIS — E1142 Type 2 diabetes mellitus with diabetic polyneuropathy: Secondary | ICD-10-CM | POA: Diagnosis present

## 2018-07-07 DIAGNOSIS — R41841 Cognitive communication deficit: Secondary | ICD-10-CM | POA: Diagnosis present

## 2018-07-07 DIAGNOSIS — Z7189 Other specified counseling: Secondary | ICD-10-CM

## 2018-07-07 DIAGNOSIS — C801 Malignant (primary) neoplasm, unspecified: Secondary | ICD-10-CM | POA: Diagnosis present

## 2018-07-07 DIAGNOSIS — Z66 Do not resuscitate: Secondary | ICD-10-CM | POA: Diagnosis not present

## 2018-07-07 DIAGNOSIS — Z515 Encounter for palliative care: Secondary | ICD-10-CM

## 2018-07-07 DIAGNOSIS — J449 Chronic obstructive pulmonary disease, unspecified: Secondary | ICD-10-CM | POA: Diagnosis present

## 2018-07-07 DIAGNOSIS — E11319 Type 2 diabetes mellitus with unspecified diabetic retinopathy without macular edema: Secondary | ICD-10-CM | POA: Diagnosis present

## 2018-07-07 DIAGNOSIS — R531 Weakness: Secondary | ICD-10-CM

## 2018-07-07 DIAGNOSIS — Z794 Long term (current) use of insulin: Secondary | ICD-10-CM | POA: Diagnosis not present

## 2018-07-07 DIAGNOSIS — I2510 Atherosclerotic heart disease of native coronary artery without angina pectoris: Secondary | ICD-10-CM | POA: Diagnosis present

## 2018-07-07 DIAGNOSIS — E1122 Type 2 diabetes mellitus with diabetic chronic kidney disease: Secondary | ICD-10-CM | POA: Diagnosis present

## 2018-07-07 DIAGNOSIS — I159 Secondary hypertension, unspecified: Secondary | ICD-10-CM | POA: Diagnosis present

## 2018-07-07 DIAGNOSIS — E1151 Type 2 diabetes mellitus with diabetic peripheral angiopathy without gangrene: Secondary | ICD-10-CM | POA: Diagnosis present

## 2018-07-07 DIAGNOSIS — C163 Malignant neoplasm of pyloric antrum: Secondary | ICD-10-CM

## 2018-07-07 DIAGNOSIS — K920 Hematemesis: Secondary | ICD-10-CM | POA: Diagnosis not present

## 2018-07-07 DIAGNOSIS — N400 Enlarged prostate without lower urinary tract symptoms: Secondary | ICD-10-CM | POA: Diagnosis present

## 2018-07-07 DIAGNOSIS — C169 Malignant neoplasm of stomach, unspecified: Secondary | ICD-10-CM | POA: Diagnosis not present

## 2018-07-07 DIAGNOSIS — I130 Hypertensive heart and chronic kidney disease with heart failure and stage 1 through stage 4 chronic kidney disease, or unspecified chronic kidney disease: Secondary | ICD-10-CM | POA: Diagnosis present

## 2018-07-07 DIAGNOSIS — E782 Mixed hyperlipidemia: Secondary | ICD-10-CM | POA: Diagnosis present

## 2018-07-07 DIAGNOSIS — D649 Anemia, unspecified: Secondary | ICD-10-CM | POA: Diagnosis present

## 2018-07-07 DIAGNOSIS — D696 Thrombocytopenia, unspecified: Secondary | ICD-10-CM | POA: Diagnosis present

## 2018-07-07 DIAGNOSIS — D472 Monoclonal gammopathy: Secondary | ICD-10-CM | POA: Diagnosis present

## 2018-07-07 DIAGNOSIS — R278 Other lack of coordination: Secondary | ICD-10-CM | POA: Diagnosis present

## 2018-07-07 DIAGNOSIS — R112 Nausea with vomiting, unspecified: Secondary | ICD-10-CM | POA: Diagnosis present

## 2018-07-07 DIAGNOSIS — D62 Acute posthemorrhagic anemia: Secondary | ICD-10-CM | POA: Diagnosis present

## 2018-07-07 DIAGNOSIS — N179 Acute kidney failure, unspecified: Secondary | ICD-10-CM | POA: Diagnosis present

## 2018-07-07 DIAGNOSIS — Z1159 Encounter for screening for other viral diseases: Secondary | ICD-10-CM | POA: Diagnosis not present

## 2018-07-07 DIAGNOSIS — I959 Hypotension, unspecified: Secondary | ICD-10-CM | POA: Diagnosis not present

## 2018-07-07 DIAGNOSIS — R1312 Dysphagia, oropharyngeal phase: Secondary | ICD-10-CM | POA: Diagnosis present

## 2018-07-07 DIAGNOSIS — M6281 Muscle weakness (generalized): Secondary | ICD-10-CM | POA: Diagnosis present

## 2018-07-07 DIAGNOSIS — D500 Iron deficiency anemia secondary to blood loss (chronic): Secondary | ICD-10-CM | POA: Diagnosis not present

## 2018-07-07 LAB — CBC
HCT: 20.5 % — ABNORMAL LOW (ref 39.0–52.0)
Hemoglobin: 6.6 g/dL — CL (ref 13.0–17.0)
MCH: 28.8 pg (ref 26.0–34.0)
MCHC: 32.2 g/dL (ref 30.0–36.0)
MCV: 89.5 fL (ref 80.0–100.0)
Platelets: 133 10*3/uL — ABNORMAL LOW (ref 150–400)
RBC: 2.29 MIL/uL — ABNORMAL LOW (ref 4.22–5.81)
RDW: 14.8 % (ref 11.5–15.5)
WBC: 9.1 10*3/uL (ref 4.0–10.5)
nRBC: 0 % (ref 0.0–0.2)

## 2018-07-07 LAB — HEMOGLOBIN AND HEMATOCRIT, BLOOD
HCT: 24 % — ABNORMAL LOW (ref 39.0–52.0)
Hemoglobin: 8 g/dL — ABNORMAL LOW (ref 13.0–17.0)

## 2018-07-07 LAB — BASIC METABOLIC PANEL
Anion gap: 14 (ref 5–15)
BUN: 82 mg/dL — ABNORMAL HIGH (ref 8–23)
CO2: 17 mmol/L — ABNORMAL LOW (ref 22–32)
Calcium: 8.3 mg/dL — ABNORMAL LOW (ref 8.9–10.3)
Chloride: 110 mmol/L (ref 98–111)
Creatinine, Ser: 3.38 mg/dL — ABNORMAL HIGH (ref 0.61–1.24)
GFR calc Af Amer: 21 mL/min — ABNORMAL LOW (ref >60)
GFR calc non Af Amer: 18 mL/min — ABNORMAL LOW (ref >60)
Glucose, Bld: 229 mg/dL — ABNORMAL HIGH (ref 70–99)
Potassium: 4.3 mmol/L (ref 3.5–5.1)
Sodium: 141 mmol/L (ref 135–145)

## 2018-07-07 LAB — PREPARE RBC (CROSSMATCH)

## 2018-07-07 MED ORDER — SODIUM CHLORIDE 0.9% IV SOLUTION: Freq: Once | INTRAVENOUS | Status: DC

## 2018-07-07 MED ORDER — FUROSEMIDE 10 MG/ML IJ SOLN
40.0000 mg | Freq: Once | INTRAMUSCULAR | Status: AC
  Administered 2018-07-07: 40 mg via INTRAVENOUS
  Filled 2018-07-07: qty 4

## 2018-07-07 MED ORDER — PANTOPRAZOLE SODIUM 40 MG IV SOLR
40.0000 mg | Freq: Two times a day (BID) | INTRAVENOUS | Status: DC
  Administered 2018-07-07 – 2018-07-09 (×6): 40 mg via INTRAVENOUS
  Filled 2018-07-07 (×6): qty 40

## 2018-07-07 NOTE — Progress Notes (Signed)
Daily Rounding Note  07/07/2018, 8:50 AM  LOS: 0 days   SUBJECTIVE:   Chief complaint: recurrent GIB in pt with gastric adenoca    Pt feels ok, no complaints.  Feels tired.  Not SOB.  No belly pain or nausea.    OBJECTIVE:         Vital signs in last 24 hours:    Temp:  [98.2 F (36.8 C)-100.4 F (38 C)] 99.1 F (37.3 C) (06/03 0808) Pulse Rate:  [71-86] 71 (06/03 0808) Resp:  [16-20] 18 (06/03 0808) BP: (108-164)/(40-109) 134/56 (06/03 0808) SpO2:  [96 %-100 %] 98 % (06/03 0808) Weight:  [77.9 kg-78.5 kg] 77.9 kg (06/02 2341)   Filed Weights   07/06/18 1422 07/06/18 2341  Weight: 78.5 kg 77.9 kg   General: looks generally unwell, not acutely ill.     Heart: RRR Chest: clear in front.  No SOB Abdomen: soft, NT, ND.  BS hypoactive.    Extremities: no CCE Neuro/Psych:  Somnolent, arouseable, follows commands, laconic.    Intake/Output from previous day: 06/02 0701 - 06/03 0700 In: 736.3 [P.O.:500; I.V.:136.3; IV Piggyback:100] Out: -   Intake/Output this shift: No intake/output data recorded.  Lab Results: Recent Labs    07/06/18 1517 07/07/18 0356  WBC 6.7 9.1  HGB 4.8* 6.6*  HCT 15.8* 20.5*  PLT 136* 133*   BMET Recent Labs    07/06/18 1517 07/07/18 0356  NA 139 141  K 4.1 4.3  CL 108 110  CO2 22 17*  GLUCOSE 247* 229*  BUN 86* 82*  CREATININE 3.53* 3.38*  CALCIUM 8.4* 8.3*   LFT Recent Labs    07/06/18 1517  PROT 5.2*  ALBUMIN 2.9*  AST 9*  ALT 9  ALKPHOS 81  BILITOT 0.3   Scheduled Meds: . sodium chloride   Intravenous Once  . sodium chloride   Intravenous Once  . atorvastatin  40 mg Oral q1800  . cholecalciferol  2,000 Units Oral TID  . [START ON 07/13/2018] cloNIDine  0.2 mg Transdermal Q Tue  . doxazosin  8 mg Oral Daily  . feeding supplement (ENSURE ENLIVE)  237 mL Oral TID BM  . ferrous sulfate  325 mg Oral Daily  . levothyroxine  100 mcg Oral QAC breakfast  . [START  ON 07/10/2018] pantoprazole  40 mg Intravenous Q12H  . umeclidinium bromide  1 puff Inhalation Daily   Continuous Infusions: . pantoprozole (PROTONIX) infusion 8 mg/hr (07/06/18 2335)   PRN Meds:.acetaminophen, ipratropium-albuterol, ondansetron **OR** ondansetron (ZOFRAN) IV   ASSESMENT:   *   Advanced gastric adenocarcinoma with GI bleed, admission # 3 for this.   03/29/18 EGD: Non-bleeding GU with pigmented spot, gastritis, duodenitis.   05/31/18 EUS: T2NO.  Surgery initially delayed due to need for cardiac clearance. "Cleared" based on 5/22 Lexiscan but surgery deferred to his surgeon, Dr Barry Dienes, who was not available during 5/20 - 5/24 admission.  Dr Tarri Glenn did not opt to repeat EGD when admitted (#2) with melena, dark emesis and Hgb 4 (received several PRBCs)  Dr Barry Dienes "saw" pt 5/29 and he has appt with her in August  as well.  "-If he has a reasonable cardiac evaluation and does not have extensive cancer based on an endoscopic ultrasound, I do not think it would be unreasonable to do a distal gastrectomy. -I discussed that distal gastrectomy is been much better tolerated, however he is still at least about twice as high risk based  on the [employer] of surgeons frailty index. -If he has early stage cancer, he would have the most again from a gastrectomy.  If he has more extensive gastric involvement or lymphatic disease, he would be less likely to benefit. -I think radiation is a reasonable palliative option if his cardiac risk is prohibitive or if his cancer is more extensive than just the pylorus. -We will make a decision once we have this information back"  Seen in tele visit by rad onc PA on 6/2:  plan is to begin 5.5 weeks of radiation.   However he was sent from that visit to ED for recurrent bleeding.  Timing for starting radiation not known.    *   Recurrent bleeding from above.   *   Blood loss anemia.   Hgb 4.8 >> 2 PRBCs >> 6.6. PRBC #3 transfusing. Was 8.1 on 06/27/18.     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    *   Pulm htn.  COPD.    *   CAD  *   Chronic thrombocytopenia.  Non-critical.    *   DM2.     PLAN   *  Needs to get radiation start date finalized.    *   Complete transfusions.  Suspect he will have recurrent issues with GI bleeding and blood loss anemia going forward.    *   Switch to Protonix 40 mg IV BID when current drip bag finishes.  *   No role for endoscopy, GI signing off, and does not need to follow pt.        *  Transfusions needed in future could be arranged at outpt day hospital but would need to be arranged by a PMD or his oncologist.      Azucena Freed  07/07/2018, 8:50 AM Phone [phone]

## 2018-07-07 NOTE — TOC Initial Note (Signed)
Transition of Care [facility]) - Initial/Assessment Note    Patient Details  Name: Leonard [name] MRN: [id number] Date of Birth: [birth year]  Transition of Care [facility]) CM/SW Contact:    Bartholomew Crews, RN Phone Number: [phone] 07/07/2018, 2:37 PM  Clinical Narrative:                 Spoke with patient's niece - Karie Kirks - via telephone. Verified that patient lives at skilled [facility]. Plan is for patient to return once medically ready. No other transition of care needs identified at this time.   Expected Discharge Plan: Long Term Nursing Home([facility]) Barriers to Discharge: Continued Medical Work up   Patient Goals and CMS Choice Patient states their goals for this hospitalization and ongoing recovery are:: Plan is for patient to return to [place] when medically stable per his niece - Engineer, production      Expected Discharge Plan and Services Expected Discharge Plan: Long Term Nursing Home([facility]) In-house Referral: Clinical Social Work Discharge Planning Services: CM Consult Post Acute Care Choice: Nursing Home Living arrangements for the past 2 months: [place]                 DME Arranged: N/A DME Agency: NA       HH Arranged: NA [place] Agency: NA        Prior Living Arrangements/Services Living arrangements for the past 2 months: [place] Lives with:: Facility Resident Patient language and need for interpreter reviewed:: Yes Do you feel safe going back to the place where you live?: Yes      Need for Family Participation in Patient Care: Yes (Comment) Care giver support system in place?: Yes (comment)   Criminal Activity/Legal Involvement Pertinent to Current Situation/Hospitalization: No - Comment as needed  Activities of Daily Living      Permission Sought/Granted                  Emotional Assessment              Admission diagnosis:  Gastrointestinal  hemorrhage associated with acute gastritis [K29.01] Anemia, unspecified type [D64.9] Patient Active Problem List   Diagnosis Date Noted  . GI bleed 06/23/2018  . Acute renal failure superimposed on chronic kidney disease ([place])   . Abdominal pain   . Gastric adenocarcinoma ([place])   . DNR (do not resuscitate)   . 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 Palliative care encounter   . Anemia 03/29/2018  . Acute GI bleeding 03/28/2018  . Dysphagia 10/01/2017  . Palliative care by specialist   . Severe comorbid illness   . Moderate malnutrition ([place]) 08/27/2017  . Hypoxemia   . Atelectasis   . Chronic respiratory failure with hypoxia ([place]) 08/25/2017  . Pulmonary artery hypertension ([place]) 07/25/2017  . Pressure injury of skin 07/20/2017  . Goals of care, counseling/discussion 02/11/2017  . Diabetic polyneuropathy associated with type 2 diabetes mellitus ([place]) 01/14/2017  . Pleural effusion   . Chronic anemia 02/28/2016  . Diabetic retinopathy ([place]) 11/13/2015  . Iron deficiency anemia 08/03/2015  . Type II diabetes mellitus with complication ([place])   . Chronic kidney disease (CKD), stage III (moderate) (HCC)   . MGUS (monoclonal gammopathy of unknown significance) 01/15/2015  . Deficiency anemia 01/15/2015  . CKD (chronic kidney disease) stage 4, GFR 15-29 ml/min (HCC) 01/15/2015  . Hypothyroidism 11/22/2014  . COPD GOLD III  02/16/2014  . Anemia of  renal disease   . Chronic diastolic CHF (congestive heart failure) ([place])   . History of tobacco use 08/23/2013  . 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 CAD (coronary artery disease) 04/06/2013  . PVD - hx of Rt SFA PTA and s/p Rt 4-5th toe amp Dec 2014 04/05/2013  . 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 Mixed hyperlipidemia 07/17/2008   PCP:  Kathrene Alu, MD Pharmacy:  No Pharmacies Listed    Social Determinants of Health (SDOH) Interventions    Readmission Risk Interventions No flowsheet data found.

## 2018-07-07 NOTE — Progress Notes (Signed)
FPTS Interim Progress Note  S: Went to see patient after blood transfusion.  Patient resting comfortably in bed.  Denies any lightheadedness or dizziness, chest pain, shortness of breath.  Notes he feels better this morning.  Denies any further emesis.   O: BP (!) 145/56 (BP Location: Right Arm)   Pulse 74   Temp 98.2 F (36.8 C) (Oral)   Resp 20   Ht 5\' 8"  (1.727 m)   Wt 78.5 kg   SpO2 97%   BMI 26.30 kg/m   Gen: Resting comfortably, sleepy but arousable, NAD CV: RRR, no m/r/g, 2+ radial pulses bilaterally Lungs: Clear to auscultation bilaterally, normal WOB on RA Abdomen: nontender, nondistended, normoactive bowel sounds Extremities: trace edema to RLE, no edema to left thigh (bka), warm and well perfused Neuro: speech normal   A/P: Post transfusion H&H: 6.6. No further obvious signs of bleeding. Will given another 1U pRBC, transfusion threshold <8 Follow up post transfusion H&H Given 40 mg IV lasix x 1  Monitor I/O's, daily weights   Danna Hefty, DO 07/07/2018, 5:27 AM PGY-1, [place] [facility] Service pager [phone]

## 2018-07-07 NOTE — Progress Notes (Signed)
Patient has returned from [facility] for radiation.  Patient seen in room.  States that he is tired and would like to rest, otherwise no complaints.  Breathing comfortably on room air.  [place] Constable, D.O.  PGY-1 Family Medicine  07/07/2018 3:16 PM

## 2018-07-07 NOTE — Telephone Encounter (Signed)
Spoke with the patients niece Karie Kirks to let her know that Mr. Colter's hemoglobin is trending up.  I let her know that we are planning to have him transferred to [facility] today at 1:15 pm for his CT simulation in radiation oncology dept and then taken back to [facility].  She verbalized understanding.  No further questions asked.  Will continue to follow as necessary.  Gloriajean Dell. Leonie Green, BSN

## 2018-07-07 NOTE — Consult Note (Signed)
Consultation Note Date: 07/07/2018   [name]  DOB: [birth year]  MRN: [id number]  Age / Sex: 68 y.o., male  PCP: Kathrene Alu, MD Referring Physician: Alveda Reasons, MD  Reason for Consultation: Establishing goals of care and Psychosocial/spiritual support  HPI/Patient Profile: 68 y.o. male admitted on 07/06/2018 with  PMX of DM2, Recurrent GI bleed, Gastric Adenocarcinoma, HTN, Hypothyroidism, PAD, MGUS, CAD, S/P CAGB  3, CKD stage IV, HLD presented with a recurrent GI bleed.   Patient is a long-term resident of skilled nursing facility/[facility].   Per oncology the patient has already been evaluated by 2 surgeons and the plan has been for palliative radiation. He will be transferred to [facility] today to initiate therapy with radiation.  Patient and family face treatment option decisions, advanced directive decisions and anticipatory care needs.   Clinical Assessment and Goals of Care:  This NP [name] reviewed medical records, received report from team, assessed the patient and then meet at the patient's bedside then spoke by phone with niece/Rosalyn Muilenburg to discuss diagnosis, prognosis, GOC, EOL wishes disposition and options.  Concept of Hospice and Palliative Care were discussed  A  discussion was had today regarding advanced directives.  Concept specific to code status was had.  The difference between a aggressive medical intervention path  and a palliative comfort care path for this patient at this time was had.  Values and goals of care important to patient and family were attempted to be elicited.  We discussed the seriousness of the situation, the limited treatment options and the likely  long term poor  Prognosis.  Thom Ollinger was adamant that patient and family is open to all offered and available medical interventions to prolong life.  Questions today why this  discussion is being had again, when a similar discussion was had 3 months ago.  I discussed with her the importance of continued conversation with patient and the rest of the family and the medical providers regarding overall plan of care and treatment options ensuring that decisions are within the context of the patient's values, goals of care and his best interest.  Discussed how patient's decisions are impacted by outcomes of disease progression and treatment options.   Questions and concerns addressed.   Family encouraged to call with questions or concerns.    PMT will continue to support holistically.   There is a documented healthcare power of attorney naming Autumn Patty Alemany and Artis Delay as healthcare agents.      SUMMARY OF RECOMMENDATIONS    Code Status/Advance Care Planning:  Full code   Encourage consideration of DNR/DNI knowing poor outcomes in similar patients.   Palliative Prophylaxis:   Aspiration, Bowel Regimen, Delirium Protocol, Frequent Pain Assessment and Oral Care  Additional Recommendations (Limitations, Scope, Preferences):  Full Scope Treatment  Psycho-social/Spiritual:   Desire for further Chaplaincy support:no  Additional Recommendations: Education on Hospice  Prognosis:   Unable to determine  Discharge Planning: To Be Determined  Primary Diagnoses: Present on Admission:  GI bleed   I have reviewed the medical record, interviewed the patient and family, and examined the patient. The following aspects are pertinent.  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  Last visits:  07/23/16: No change in meds. Worried that if we increase diuretics could lead to another AKI   CKD (chronic kidney disease), stage IV ([place])    07/07/2017   Claudication ([place])    Collapse of left talus 01/14/2017   COPD GOLD III  02/16/2014   Quit smoking 2014  - PFT's  04/06/13   FEV1 1.28 (44 % ) ratio 48  p no % improvement from saba p ?  prior to study with DLCO  43/54 % corrects to 97  % for alv volume - 01/31/2016  After extensive coaching HFA effectiveness =    75% from baseline 0 > changed to sprivia respimat   - PFT's  03/17/2016  FEV1 2.25  (81 % ) ratio 88  p 2 % improvement from saba p spiriva respimat x 2  prior to study with   Coronary artery disease    Diabetes ([place])    Diabetic polyneuropathy associated with type 2 diabetes mellitus ([place]) 01/14/2017   Diabetic retinopathy ([place]) 11/13/2015   Per Dr. Erenest Rasher eye exam in 10/17/15, mild background diabetic retinopathy   Fatigue associated with anemia 01/21/2016   Gangrene of toe ([place])    right 5th/notes 01/04/2013    HCAP (healthcare-associated pneumonia)    Hematuria 06/27/2015   High cholesterol    History of tobacco use 08/23/2013   Smoked pack per day for 46 years Quit smoking - 02/03/2013    Hypertension    Hypothyroidism 11/22/2014   Idiopathic chronic gout of left foot without tophus    Iron deficiency anemia 08/03/2015   Left foot pain 10/29/2016   Long term (current) use of anticoagulants [Z79.01] 09/09/2016   MGUS (monoclonal gammopathy of unknown significance) 01/15/2015   Mixed hyperlipidemia 07/17/2008   Morbid obesity due to excess calories ([place]) 03/17/2016   PAD (peripheral artery disease) (HCC)    Pleural effusion    Pneumonitis 07/26/2017   Pulmonary artery hypertension ([place])  07/25/2017   PVD - hx of Rt SFA PTA and s/p Rt 4-5th toe amp Dec 2014 04/05/2013   Resistant hypertension 11/25/2012   Respiratory failure ([place]) 08/13/2017   Respiratory failure with hypoxia ([place]) 09/23/2017   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   Secondary hypertension    Shortness of breath dyspnea    with exertion   Skin lesion of face 02/22/2015   SOB (shortness of breath)    Type 2 diabetes mellitus with pressure callus ([place]) 12/29/2016   Type II diabetes mellitus (HCC)    Type II   Type II diabetes mellitus with complication ([place])    Social History   Socioeconomic History   Marital status: Single    Spouse  name: Not on file   Number of children: 0   Years of education: Not on file   Highest education level: Not on file  Occupational History   Not on file  Social Needs   Financial resource strain: Not on file   Food insecurity:    Worry: Not on file    Inability: Not on file   Transportation needs:    Medical: No    Non-medical: No  Tobacco Use   Smoking status: Former Smoker    Packs/day: 1.00    Years: 46.00    Pack years: 46.00    Types: Cigarettes    Start date: 02/04/1967    Last attempt to quit: 02/03/2013    Years since quitting: 5.4   Smokeless tobacco: Never Used  Substance and Sexual Activity   Alcohol use: No    Alcohol/week: 0.0 standard drinks   Drug use: No   Sexual activity: Not Currently  Lifestyle   Physical activity:    Days per week: Not on file    Minutes per session: Not on file   Stress: Not on file  Relationships   Social connections:    Talks on phone: Not on file    Gets together: Not on file    Attends religious service: Not on file    Active member of club or organization: Not on file    Attends meetings of clubs or organizations: Not on file    Relationship status: Not on file  Other Topics Concern   Not on file  Social History Narrative   Retired - former Sports coach.   Patient in skilled nursing facility.    Family History  Problem Relation Age of Onset   Cancer Mother        lung cancer   Hypertension Mother    Cancer Sister        patient thinks it was uterine cancer   Hypertension Sister    Heart attack Sister    Stroke Neg Hx    Scheduled Meds:  sodium chloride   Intravenous Once   sodium chloride   Intravenous Once   atorvastatin  40 mg Oral q1800   cholecalciferol  2,000 Units Oral TID   [START ON 07/13/2018] cloNIDine  0.2 mg Transdermal Q Tue   doxazosin  8 mg Oral Daily   feeding supplement (ENSURE ENLIVE)  237 mL Oral TID BM   ferrous sulfate  325 mg Oral Daily   levothyroxine  100 mcg Oral QAC breakfast   pantoprazole  40 mg Intravenous Q12H   umeclidinium bromide  1 puff Inhalation Daily   Continuous Infusions: PRN Meds:.acetaminophen, ipratropium-albuterol, ondansetron **OR** ondansetron (ZOFRAN) IV Medications Prior to Admission:  Prior to Admission medications   Medication Sig Start Date End Date Taking? Authorizing Provider  acetaminophen (TYLENOL) 325 MG tablet Take 650 mg by mouth every 8 (eight) hours as needed for mild pain or fever.    Yes [provider]  amLODipine (NORVASC) 10 MG tablet Take 1 tablet (10 mg total) by mouth daily. Patient taking differently: Take 5 mg by mouth daily.  06/11/18  Yes Larey Dresser, MD  aspirin EC 81 MG tablet Take 81 mg by mouth daily.   Yes [provider]  atorvastatin (LIPITOR) 40 MG tablet TAKE ONE (1) TABLET BY MOUTH EVERY DAY AT 6:00PM Patient taking differently: Take 40 mg by mouth daily at 6 PM.  11/17/16  Yes Larey Dresser, MD  carvedilol (COREG) 12.5  MG tablet Take 1 tablet (12.5 mg total) by mouth 2 (two) times daily with a meal. 08/30/17  Yes Meccariello, Bernita Raisin, DO  cholecalciferol (VITAMIN D) 25 MCG (1000 UT) tablet Take 2,000 Units by mouth 3 (three) times daily.   Yes [provider]  cloNIDine (CATAPRES - DOSED IN MG/24 HR) 0.2 mg/24hr patch Place 1 patch (0.2 mg  total) onto the skin once a week. Patient taking differently: Place 0.2 mg onto the skin every Tuesday.  08/09/17  Yes Steve Rattler, DO  Darbepoetin Alfa (ARANESP) 40 MCG/0.4ML SOSY injection Inject 0.4 mLs (40 mcg total) into the skin every 30 (thirty) days. 09/28/17  Yes Meccariello, Bernita Raisin, DO  doxazosin (CARDURA) 8 MG tablet Take 1 tablet (8 mg total) by mouth every evening. Patient taking differently: Take 8 mg by mouth daily.  12/01/16  Yes Hensel, Jamal Collin, MD  feeding supplement, ENSURE ENLIVE, (ENSURE ENLIVE) LIQD Take 237 mLs by mouth 3 (three) times daily between meals. 08/07/17  Yes Riccio, Angela C, DO  ferrous sulfate (FEROSUL) 325 (65 FE) MG tablet Take 1 tablet (325 mg total) by mouth daily at 12 noon. Patient taking differently: Take 325 mg by mouth daily.  08/31/17  Yes Meccariello, Bernita Raisin, DO  Glycopyrrolate (LONHALA MAGNAIR STARTER KIT) 25 MCG/ML SOLN Inhale 1 mL into the lungs 2 (two) times a day.   Yes [provider]  hydrALAZINE (APRESOLINE) 100 MG tablet TAKE ONE TABLET BY MOUTH EVERY EIGHT HOURS Patient taking differently: Take 100 mg by mouth every 8 (eight) hours.  11/14/16  Yes [name], MD  insulin aspart (NOVOLOG) 100 UNIT/ML injection Inject 0-10 Units into the skin See admin instructions. Three times daily per sliding scale: <150 = 0 units 151-200 = 2 units 201-250 = 4 units 251-300 = 6 units 301-350 = 8 units 351-400 = 10 units Call PCP if CBG <70 or >400   Yes [provider]  ipratropium-albuterol (DUONEB) 0.5-2.5 (3) MG/3ML SOLN Take 3 mLs by nebulization every 6 (six) hours as needed (shortness of breath and wheezing).    Yes [provider]  isosorbide mononitrate (IMDUR) 120 MG 24 hr tablet Take 1 tablet (120 mg total) by mouth daily. 10/02/17  Yes Anderson, Chelsey L, DO  levothyroxine (SYNTHROID, LEVOTHROID) 100 MCG tablet Take 100 mcg by mouth daily before breakfast.    Yes [provider]    nitroGLYCERIN (NITROSTAT) 0.4 MG SL tablet Place 1 tablet (0.4 mg total) under the tongue every 5 (five) minutes as needed for chest pain. 11/14/16  Yes [name], MD  ondansetron (ZOFRAN) 4 MG tablet Take 4 mg by mouth every 8 (eight) hours as needed for nausea or vomiting.   Yes [provider]  pantoprazole (PROTONIX) 40 MG tablet Take 1 tablet (40 mg total) by mouth 2 (two) times daily. 04/03/18  Yes Beard, Aldona Bar N, DO  polyethylene glycol (MIRALAX / GLYCOLAX) packet Take 17 g by mouth daily as needed for mild constipation or moderate constipation. Patient taking differently: Take 17 g by mouth daily as needed for mild constipation.  08/07/17  Yes Riccio, Levada Dy C, DO  potassium chloride SA (K-DUR,KLOR-CON) 20 MEQ tablet Take 2 tablets (40 mEq total) by mouth daily. 02/16/18  Yes Larey Dresser, MD  torsemide (DEMADEX) 20 MG tablet Take 2 tablets (40 mg total) by mouth daily. 02/16/18  Yes Larey Dresser, MD  Blood Glucose Monitoring Suppl ([place]) w/Device KIT 1 kit  by Does not apply route daily. 10/10/16   McDiarmid, Blane Ohara, MD  glucose blood (ONETOUCH VERIO) test strip Use as instructed to test three times daily. ICD-10 code: E11.22 10/15/16   McDiarmid, Blane Ohara, MD  [facility] DELICA LANCETS 62I MISC 1 each by Does not apply route 3 (three) times daily. ICD-10 code: E11.22 10/15/16   McDiarmid, Blane Ohara, MD  umeclidinium bromide (INCRUSE ELLIPTA) 62.5 MCG/INH AEPB Inhale 1 puff into the lungs daily. Patient not taking: Reported on 07/06/2018 06/28/18   Mina Marble P, DO   Allergies  Allergen Reactions   Penicillins Other (See Comments)    Has patient had a PCN reaction causing immediate rash, facial/tongue/throat swelling, SOB or lightheadedness with hypotension: Unknown Has patient had a PCN reaction causing severe rash involving mucus membranes or skin necrosis: Unknown Has patient had a PCN reaction that required hospitalization: Unknown Has patient  had a PCN reaction occurring within the last 10 years: Unknown If all of the above answers are "NO", then may proceed with Cephalosporin use.    Review of Systems  Neurological: Positive for weakness.    Physical Exam Constitutional:      Appearance: He is cachectic.  Cardiovascular:     Rate and Rhythm: Normal rate and regular rhythm.     Heart sounds: Normal heart sounds.  Pulmonary:     Breath sounds: Decreased breath sounds present.  Skin:    General: Skin is warm and dry.  Neurological:     Mental Status: He is alert.     Vital Signs: BP (!) 168/65 (BP Location: Right Arm)    Pulse 92    Temp 99.8 F (37.7 C) (Oral)    Resp 18    Ht _0  (1.727 m)    Wt 77.9 kg    SpO2 94%    BMI 26.11 kg/m  Pain Scale: 0-10   Pain Score: 0-No pain   SpO2: SpO2: 94 % O2 Device:SpO2: 94 % O2 Flow Rate: .   IO: Intake/output summary:   Intake/Output Summary (Last 24 hours) at 07/07/2018 1257 Last data filed at 07/07/2018 1155 Gross per 24 hour  Intake 1051.33 ml  Output 1600 ml  Net -548.67 ml    LBM:   Baseline Weight: Weight: 78.5 kg Most recent weight: Weight: 77.9 kg      Palliative Assessment/Data: 30%   PMT will continue to support holistically  Time In: 1345 Time Out: 1500 Time Total: 75 minutes Greater than 50%  of this time was spent counseling and coordinating care related to the above assessment and plan.  Signed by: [name], NP   Please contact Palliative Medicine Team phone at [phone] for questions and concerns.  For individual provider: See Shea Evans

## 2018-07-07 NOTE — Progress Notes (Signed)
The patient is not seen. I was contacted by primary service about the plan of care for this patient. I saw him last in March. Please see my documentation from 04/14/2018. He is not a chemotherapy candidate due to significant co-morbidities.  The patient/family wants further treatment.  He had been seen by 2 surgeons who deemed him high risk for surgery.  He was ultimately referred to get palliative radiation. From the anemia stand point, the patient has been noncompliant with numerous no-shows. I have discussed with radiation oncologist that I will not be managing his anemia. He was last treated by Dr. Marval Regal from nephrology service for anemia. He does not need future follow-up with me. If his cancer does not respond to palliative radiation, I recommend hospice.

## 2018-07-07 NOTE — Progress Notes (Signed)
Spoke with RN Ulice Dash to inform her that [name] has a radiation oncology appointment today at 1:30.  I let her know that she needs to call [facility] and have him here at the cancer center at 1:15 pm.  Will continue to follow as necessary.  Gloriajean Dell. Leonie Green, BSN

## 2018-07-07 NOTE — Progress Notes (Signed)
Patient back from [facility].

## 2018-07-07 NOTE — Progress Notes (Addendum)
Family Medicine Teaching Service Daily Progress Note Intern Pager: [phone]  [name] Medical record number: [id number] Date of birth: 12/17/1950 Age: 68 y.o. Gender: male  Primary Care Provider: Kathrene Alu, MD Consultants: GI Code Status: Full  Pt Overview and Major Events to Date:  6/2 - admit for GI bleed  Assessment and Plan: Leonard [name] is a 68 y.o. male presenting with bloody emesis and hemoglobin of 4.8.  His previous medical history significant for gastric adenocarcinoma, heart failure with preserved ejection fraction, recurrent GI bleed, CKD, hypertension, hypothyroidism, anemia.  Upper GI bleed likely secondary to gastric cancer 2 units RBCs given overnight.  1 additional unit being given this morning.  Temperature up to 100.4 overnight likely related to transfusion reaction, no shortness of breath, angioedema, hives.  GI assessed on 6/2.  Patient is clearly failed outpatient therapy.  GI recommends surgery and oncology be on board for further discussion as this bleeding will not be resolved by endoscopic treatment.  Spoke extensively with oncologist [name] who reported that this patient has been followed closely for a long time by oncology.  She reports that he is not a candidate for chemotherapy nor is she a candidate for surgery.  His best option at this time is palliative radiation.  She understands that he frequently is hospitalized due to GI bleeds, he should be given blood as necessary and seen by palliative care per her recommendations.  Radiation oncologist his arranged to begin radiation therapy today at [facility].  We will arrange transportation to [facility] for therapy and then return to [facility]. -Transfused 2 units PRBCs, third unit ordered - trend serial hemoglobin - Protonix IV - Monitor vitals every 4 hours - Holding ASA - Consult palliative care -Tylenol as needed  Diet controlled DMT2 HgA1C 6.5 in 06/2018.  -  monitor CBGs  Acute on CKD stage IV -Daily BMP  Gastric adenocarcinoma Per oncology note on 6/2, stage I, T2N0M0 adenocarcinoma of the stomach.    See upper GI bleed above for details. -Continue to follow outpatient with oncology  Heart failure with preserved ejection fraction, chronic Assist following second unit of blood.  IV Lasix 40 given x1 for diuresis.  Assessed after second unit of blood, given additional Lasix at that time.  Euvolemic on exam this morning. -Holding Coreg due to low blood pressures -Hold Imdur due to low blood pressures -Holding torsemide due to low volume status and low blood pressures -Holding potassium because the torsemide is being held  Hypertension, chronic Systolic pressures 542-706 overnight, diastolic pressures 23-76 overnight. -Continue clonidine to avoid rebound hypertension -Holding amlodipine and hydralazine due to low diastolic pressures  Chronic anemia -Continue iron supplements once taking p.o.  CAD s/p CABG Her medication includes aspirin 81 mg daily and atorvastatin. -Continue atorvastatin -Hold aspirin in the setting of GI bleed  Hypothyroidism -Synthroid 100 mg daily   COPD -Continue glycopyrrolate -Duo nebs PRN  BPH -doxazosin   FEN/GI: NPO Prophylaxis: SCD (holding anticoagulation due to GI bleed)  Disposition:   Subjective:  Patient reports tolerating his transfusions well overnight.  He has no new complaints this morning.  Fever up to 100.4 reported overnight.  No complaints of fever or new discomfort this morning.  Specifically denies shortness of breath, chest pain, stomach pain.  Objective: Temp:  [98.2 F (36.8 C)-100.4 F (38 C)] 98.2 F (36.8 C) (06/03 0506) Pulse Rate:  [72-86] 74 (06/03 0506) Resp:  [16-20] 20 (06/03 0506) BP: (  108-164)/(40-109) 145/56 (06/03 0506) SpO2:  [96 %-100 %] 97 % (06/03 0506) Weight:  [77.9 kg-78.5 kg] 77.9 kg (06/02 2341)  Physical Exam: General: Fully awake alert  and oriented x4 this morning.  Intact capacity and understanding of medical condition.  He is significantly more awake compared to admission. HEENT: Pale conjunctiva Cardio: Normal S1 and S2, no S3 or S4. Rhythm is regular. No murmurs or rubs.   Pulm: Clear to auscultation bilaterally, no crackles, wheezing, or diminished breath sounds. Normal respiratory effort Abdomen: Bowel sounds normal. Abdomen soft and non-tender.  Extremities: No peripheral edema. Warm/ well perfused.  Strong radial pulse. Neuro: Cranial nerves grossly intact   Laboratory: Recent Labs  Lab 07/06/18 1517 07/07/18 0356  WBC 6.7 9.1  HGB 4.8* 6.6*  HCT 15.8* 20.5*  PLT 136* 133*   Recent Labs  Lab 07/06/18 1517 07/07/18 0356  NA 139 141  K 4.1 4.3  CL 108 110  CO2 22 17*  BUN 86* 82*  CREATININE 3.53* 3.38*  CALCIUM 8.4* 8.3*  PROT 5.2*  --   BILITOT 0.3  --   ALKPHOS 81  --   ALT 9  --   AST 9*  --   GLUCOSE 247* 229*     Imaging/Diagnostic Tests: Nm Myocar Multi W/spect W/wall Motion / Ef  Addendum Date: 06/25/2018    There was no ST segment deviation noted during stress.  Nuclear stress EF: 55%. The left ventricular ejection fraction is normal (55-65%).  This is a low risk study. The stress images are somewhat difficult to assess due to relatively low counts compared to the resting images . There appears to be diffuse attenuation of the entire LV but particularly in the lateral wal  The lateral wall contracts normally and his EF is normal . The patient is asymptomatic.  I think the patient is at low risk for his upcoming gastric surgery    Result Date: 06/25/2018  There was no ST segment deviation noted during stress.  Nuclear stress EF: 55%. The left ventricular ejection fraction is normal (55-65%).  This is a low risk study. There is no evidence of ischemia and no evidence of previous infarction  The study is normal.    Dg Chest Portable 1 View  Result Date: 06/23/2018 CLINICAL DATA:   Weakness vomiting EXAM: PORTABLE CHEST 1 VIEW COMPARISON:  03/28/2018, PET-CT 04/21/2018 FINDINGS: Possible hazy infiltrate at the left base. Mild cardiomegaly. No pleural effusion or pneumothorax. IMPRESSION: Cardiomegaly.  Possible mild hazy infiltrate at the left base. Electronically Signed   By: Donavan Foil M.D.   On: 06/23/2018 21:27     Matilde Haymaker, MD 07/07/2018, 6:10 AM PGY-1, [place] Intern pager: [phone], text pages welcome

## 2018-07-07 NOTE — Progress Notes (Signed)
Pt current Hemoglobin-6.6. On call provider made aware. Awaiting orders. Will continue to monitor.

## 2018-07-08 ENCOUNTER — Ambulatory Visit: Admission: RE | Admit: 2018-07-08 | Payer: Medicare Other | Source: Ambulatory Visit | Admitting: Radiation Oncology

## 2018-07-08 ENCOUNTER — Ambulatory Visit: Payer: Medicare Other | Admitting: Radiation Oncology

## 2018-07-08 ENCOUNTER — Ambulatory Visit: Payer: Medicare Other

## 2018-07-08 DIAGNOSIS — K2901 Acute gastritis with bleeding: Principal | ICD-10-CM

## 2018-07-08 DIAGNOSIS — K920 Hematemesis: Secondary | ICD-10-CM

## 2018-07-08 LAB — BASIC METABOLIC PANEL
Anion gap: 8 (ref 5–15)
BUN: 71 mg/dL — ABNORMAL HIGH (ref 8–23)
CO2: 21 mmol/L — ABNORMAL LOW (ref 22–32)
Calcium: 8.3 mg/dL — ABNORMAL LOW (ref 8.9–10.3)
Chloride: 111 mmol/L (ref 98–111)
Creatinine, Ser: 3.11 mg/dL — ABNORMAL HIGH (ref 0.61–1.24)
GFR calc Af Amer: 23 mL/min — ABNORMAL LOW (ref >60)
GFR calc non Af Amer: 20 mL/min — ABNORMAL LOW (ref >60)
Glucose, Bld: 193 mg/dL — ABNORMAL HIGH (ref 70–99)
Potassium: 4 mmol/L (ref 3.5–5.1)
Sodium: 140 mmol/L (ref 135–145)

## 2018-07-08 LAB — CBC
HCT: 22.4 % — ABNORMAL LOW (ref 39.0–52.0)
Hemoglobin: 7.4 g/dL — ABNORMAL LOW (ref 13.0–17.0)
MCH: 29.4 pg (ref 26.0–34.0)
MCHC: 33 g/dL (ref 30.0–36.0)
MCV: 88.9 fL (ref 80.0–100.0)
Platelets: 132 10*3/uL — ABNORMAL LOW (ref 150–400)
RBC: 2.52 MIL/uL — ABNORMAL LOW (ref 4.22–5.81)
RDW: 15.5 % (ref 11.5–15.5)
WBC: 7 10*3/uL (ref 4.0–10.5)
nRBC: 0 % (ref 0.0–0.2)

## 2018-07-08 MED ORDER — AMLODIPINE BESYLATE 5 MG PO TABS
5.0000 mg | ORAL_TABLET | Freq: Every day | ORAL | Status: DC
  Administered 2018-07-08 – 2018-07-11 (×4): 5 mg via ORAL
  Filled 2018-07-08 (×4): qty 1

## 2018-07-08 MED ORDER — TORSEMIDE 20 MG PO TABS
40.0000 mg | ORAL_TABLET | Freq: Every day | ORAL | Status: DC
  Administered 2018-07-08 – 2018-07-10 (×3): 40 mg via ORAL
  Filled 2018-07-08 (×3): qty 2

## 2018-07-08 NOTE — Progress Notes (Signed)
To clarify, the patient has stage I, cT2N0 adenocarcinoma of the stomach. While he has a curable condition, he is not currently a candidate for surgical resection. He may be at a later date a candidate for surgery. He is getting curative dose radiotherapy starting this afternoon. He would benefit from palliative resources, but has made it clear to family that he wishes to remain full code. Provided that his medical team at the facility are aware of his condition, they will continue to manage his acute on chronic blood loss anemia from his cancer, and include nephrology as needed as well given his anemia of chronic disease. It will take about 2 weeks of radiotherapy before we expect to see his blood counts stabilize, so additional transfusion will likely be necessary to maintain his counts until the effects of radiotherapy are seen clinically. I spoke with his niece to give her an update on his current situation.     Carola Rhine, PAC

## 2018-07-08 NOTE — Consult Note (Signed)
   [facility] CM Inpatient Consult   07/08/2018  SLADE PIERPOINT 1950-02-07 [id number]    Update Note:  Patient reviewed for possible [facility] care management service needs for readmission within 30 days, with 3 hospitalizations in the past 6 months; has 40% extreme high score for unplanned readmission and as benefit of his Medicare/ NextGen plan.  History and physical reveals as follows: Patient is a 68 Y/O M with PMX of DM2, Recurrent GI bleed, Gastric Adenocarcinoma, HTN, Hypothyroidism, PAD, MGUS, CAD, S/P CABG x 3, CKD stage IV, HLD presented with a recurrent GI bleed, multiple episodes of vomiting.  Review of transition of care CM note states that patient lives at skilled [facility]. Plan is for patient to return back once medically ready. Expected Discharge Plan: [facility] ([place]).  No other transition of care needs identified at this time.    For questions and additional information, please call:  Everett Ehrler A. Phillippe Orlick, BSN, RN-BC [facility] Liaison Cell: [phone]

## 2018-07-08 NOTE — Progress Notes (Signed)
Was contacted by [facility]. Pt had not arrived to treatment. This RN contacted [facility], who reported pt was not on their board for transport. Per Dr. Alycia Rossetti 1, Pt to arrive tomorrow am at 0800 for treatment. This RN contacted pt's bedside RN Lanelle Bal to convey need for [facility] transport for 0800 arrival time for radiation treatment. Lanelle Bal verbalized understanding and agreement. [facility] was contacted about 0800 transport as a courtesy. [facility] reinforced that bedside RN would need to request transport. Conveyed to [facility] bedside RN already aware. Loma Sousa, RN BSN

## 2018-07-08 NOTE — Progress Notes (Signed)
Patient ID: Leonard [name], male   DOB: [birth year], 68 y.o.   MRN: [id number]  This NP visited patient at the bedside as a follow up to  yesterday's [place].  Patient is more alert and engaged today.  He is conversant while he enjoys his meal tray.  We spoke to his experience with radiation yesterday and he said it was "not so bad"  Ultimately plan will be for patient to return to his skilled nursing facility when stable, while continuing his radiation treatments.  Placed call to niece/healthcare power of attorney for update.  Discussed with family  the importance of continued conversation with patient  and the medical providers regarding overall plan of care and treatment options,  ensuring decisions are within the context of the patients values and GOCs.  Questions and concerns addressed   Discussed with Dr Pilar Plate   Total time spent on the unit was 25 minutes  Greater than 50% of the time was spent in counseling and coordination of care  [name] NP  Palliative Medicine Team Team Phone # [phone] Pager [phone]

## 2018-07-08 NOTE — Progress Notes (Signed)
Family Medicine Teaching Service Daily Progress Note Intern Pager: [phone]  [name] Medical record number: [id number] Date of birth: Oct 13, 1950 Age: 68 y.o. Gender: male  Primary Care Provider: Kathrene Alu, MD Consultants: GI Code Status: Full  Pt Overview and Major Events to Date:  6/2 - admit for GI bleed  Assessment and Plan: Leonard [name] is a 68 y.o. male presenting with bloody emesis and hemoglobin of 4.8.  His previous medical history significant for gastric adenocarcinoma, heart failure with preserved ejection fraction, recurrent GI bleed, CKD, hypertension, hypothyroidism, anemia.  Upper GI bleed likely secondary to gastric cancer Spoke with the patient this morning and frankly informed him that he is not a surgical or chemotherapeutic candidate.  We are proceeding with palliative care and palliative radiation therapy.  It was explained to Leonard [name] that we do not plan to cure his cancer and only treating his symptoms at this point.  This appeared to be new information to Leonard [name].  He was informed that we would continue to ensure that he is stable regarding his anemia and comfortable with this current radiation therapy before discharging him back to his residence. - S/p 3 units packed RBCs - Daily CBC - Protonix IV - Monitor vitals every 4 hours - Holding ASA -Tylenol as needed  Diet controlled DMT2 HgA1C 6.5 in 06/2018.  - monitor CBGs  Acute on CKD stage IV Baseline creatinine around 3.  Baseline this morning 3.1 -Daily BMP  Gastric adenocarcinoma Per oncology note on 6/2, stage I, T2N0M0 adenocarcinoma of the stomach.    See upper GI bleed above for details.  Patient only candidate for palliative radiation therapy. -Continue to follow outpatient with oncology  Heart failure with preserved ejection fraction, chronic Euvolemic on exam this morning. -Holding Coreg due to low blood pressures -Hold Imdur due to low blood  pressures -Restart torsemide 40  Hypertension, chronic Diastolic pressures in normal range.  Elevated systolic pressures in the 160s and 170s in the past 24 hours. -Continue clonidine to avoid rebound hypertension -Restart amlodipine 5 -Holding  hydralazine due to low diastolic pressures  Chronic anemia -Continue iron supplements once taking p.o.  CAD s/p CABG Her medication includes aspirin 81 mg daily and atorvastatin. -Continue atorvastatin -Hold aspirin in the setting of GI bleed  Hypothyroidism -Synthroid 100 mg daily   COPD -Continue glycopyrrolate -Duo nebs PRN  BPH -doxazosin   FEN/GI: NPO Prophylaxis: SCD (holding anticoagulation due to GI bleed)  Disposition: Anticipate monitoring his vitals and hemoglobin for at least 1 more day before discharge back to his residence.  Subjective:  No acute events overnight.  He has no new complaints this morning generally feels comfortable and reports that his radiation therapy went well yesterday.  He was surprised to hear that he will be getting this radiation therapy again today.  He was told that we will continue to monitor him and ensure that he is stable for discharging him back to his residence.  Objective: Temp:  [98.5 F (36.9 C)-99.8 F (37.7 C)] 98.5 F (36.9 C) (06/04 0515) Pulse Rate:  [71-96] 75 (06/04 0515) Resp:  [18] 18 (06/03 1435) BP: (119-178)/(56-73) 169/62 (06/04 0515) SpO2:  [94 %-100 %] 98 % (06/04 0515)  Physical Exam: General: Alert and cooperative and appears to be in no acute distress.  Resting in bed comfortably drinking his soda. HEENT: Pale conjunctiva and tongue. Cardio: Normal S1 and S2, no S3 or S4. Rhythm is regular.  Pulm: Clear to auscultation bilaterally, no crackles, wheezing, or diminished breath sounds. Normal respiratory effort Abdomen: Bowel sounds normal. Abdomen soft and non-tender.  Extremities: No peripheral edema. Warm/ well perfused.  Strong radial pulses. Neuro:  Cranial nerves grossly intact    Laboratory: Recent Labs  Lab 07/06/18 1517 07/07/18 0356 07/07/18 1220  WBC 6.7 9.1  --   HGB 4.8* 6.6* 8.0*  HCT 15.8* 20.5* 24.0*  PLT 136* 133*  --    Recent Labs  Lab 07/06/18 1517 07/07/18 0356  NA 139 141  K 4.1 4.3  CL 108 110  CO2 22 17*  BUN 86* 82*  CREATININE 3.53* 3.38*  CALCIUM 8.4* 8.3*  PROT 5.2*  --   BILITOT 0.3  --   ALKPHOS 81  --   ALT 9  --   AST 9*  --   GLUCOSE 247* 229*     Imaging/Diagnostic Tests: Nm Myocar Multi W/spect W/wall Motion / Ef  Addendum Date: 06/25/2018    There was no ST segment deviation noted during stress.  Nuclear stress EF: 55%. The left ventricular ejection fraction is normal (55-65%).  This is a low risk study. The stress images are somewhat difficult to assess due to relatively low counts compared to the resting images . There appears to be diffuse attenuation of the entire LV but particularly in the lateral wal  The lateral wall contracts normally and his EF is normal . The patient is asymptomatic.  I think the patient is at low risk for his upcoming gastric surgery    Result Date: 06/25/2018  There was no ST segment deviation noted during stress.  Nuclear stress EF: 55%. The left ventricular ejection fraction is normal (55-65%).  This is a low risk study. There is no evidence of ischemia and no evidence of previous infarction  The study is normal.    Dg Chest Portable 1 View  Result Date: 06/23/2018 CLINICAL DATA:  Weakness vomiting EXAM: PORTABLE CHEST 1 VIEW COMPARISON:  03/28/2018, PET-CT 04/21/2018 FINDINGS: Possible hazy infiltrate at the left base. Mild cardiomegaly. No pleural effusion or pneumothorax. IMPRESSION: Cardiomegaly.  Possible mild hazy infiltrate at the left base. Electronically Signed   By: Donavan Foil M.D.   On: 06/23/2018 21:27     Matilde Haymaker, MD 07/08/2018, 6:24 AM PGY-1, [place] Intern pager: [phone], text pages welcome

## 2018-07-08 NOTE — Progress Notes (Signed)
  Radiation Oncology         [phone] ________________________________  Name: TRAYON KRANTZ MRN: [id number]  Date: 07/07/2018  DOB: 1950-12-12  SIMULATION AND TREATMENT PLANNING NOTE  DIAGNOSIS:     ICD-10-CM   1. 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  Identity was confirmed.  All relevant records and images related to the planned course of therapy were reviewed.   Written consent to proceed with treatment was confirmed which was freely given after reviewing the details related to the planned course of therapy had been reviewed with the patient.  Then, the patient was set-up in a stable reproducible  supine position for radiation therapy.  CT images were obtained.  Surface markings were placed.    Medically necessary complex treatment device(s) for immobilization:  Vac-lock bag.   The CT images were loaded into the planning software.  Then the target and avoidance structures were contoured.  Treatment planning then occurred.  The radiation prescription was entered and confirmed.  A total of 6 complex treatment devices were fabricated which relate to the designed radiation treatment fields. Each of these customized fields/ complex treatment devices will be used on a daily basis during the radiation course. I have requested : 3D Simulation  I have requested a DVH of the following structures: target, left kidney, right kidney, liver, cord.   The patient will undergo daily image guidance to ensure accurate localization of the target, and adequate minimize dose to the normal surrounding structures in close proximity to the target.   PLAN:  The patient will receive 45 Gy in 25 fractions initially. The patient will then receive a 9 Gy boost to yield a final dose of 54 Gy..  ________________________________   Jodelle Gross, MD, PhD

## 2018-07-09 ENCOUNTER — Ambulatory Visit
Admission: RE | Admit: 2018-07-09 | Discharge: 2018-07-09 | Disposition: A | Discharge status: Home / Self Care | Payer: Medicare Other | Source: Ambulatory Visit | Attending: Radiation Oncology | Admitting: Radiation Oncology

## 2018-07-09 DIAGNOSIS — D500 Iron deficiency anemia secondary to blood loss (chronic): Secondary | ICD-10-CM

## 2018-07-09 LAB — BASIC METABOLIC PANEL
Anion gap: 11 (ref 5–15)
BUN: 65 mg/dL — ABNORMAL HIGH (ref 8–23)
CO2: 20 mmol/L — ABNORMAL LOW (ref 22–32)
Calcium: 8.2 mg/dL — ABNORMAL LOW (ref 8.9–10.3)
Chloride: 109 mmol/L (ref 98–111)
Creatinine, Ser: 3.29 mg/dL — ABNORMAL HIGH (ref 0.61–1.24)
GFR calc Af Amer: 21 mL/min — ABNORMAL LOW (ref >60)
GFR calc non Af Amer: 18 mL/min — ABNORMAL LOW (ref >60)
Glucose, Bld: 217 mg/dL — ABNORMAL HIGH (ref 70–99)
Potassium: 3.8 mmol/L (ref 3.5–5.1)
Sodium: 140 mmol/L (ref 135–145)

## 2018-07-09 LAB — CBC
HCT: 21.7 % — ABNORMAL LOW (ref 39.0–52.0)
Hemoglobin: 7.3 g/dL — ABNORMAL LOW (ref 13.0–17.0)
MCH: 29.8 pg (ref 26.0–34.0)
MCHC: 33.6 g/dL (ref 30.0–36.0)
MCV: 88.6 fL (ref 80.0–100.0)
Platelets: 142 10*3/uL — ABNORMAL LOW (ref 150–400)
RBC: 2.45 MIL/uL — ABNORMAL LOW (ref 4.22–5.81)
RDW: 15.1 % (ref 11.5–15.5)
WBC: 6.4 10*3/uL (ref 4.0–10.5)
nRBC: 0 % (ref 0.0–0.2)

## 2018-07-09 LAB — HEMOGLOBIN AND HEMATOCRIT, BLOOD
HCT: 23.9 % — ABNORMAL LOW (ref 39.0–52.0)
Hemoglobin: 8.1 g/dL — ABNORMAL LOW (ref 13.0–17.0)

## 2018-07-09 LAB — PREPARE RBC (CROSSMATCH)

## 2018-07-09 MED ORDER — CARVEDILOL 12.5 MG PO TABS
12.5000 mg | ORAL_TABLET | Freq: Two times a day (BID) | ORAL | Status: DC
  Administered 2018-07-09 – 2018-07-11 (×5): 12.5 mg via ORAL
  Filled 2018-07-09 (×5): qty 1

## 2018-07-09 MED ORDER — HYDRALAZINE HCL 50 MG PO TABS
100.0000 mg | ORAL_TABLET | Freq: Three times a day (TID) | ORAL | Status: DC
  Administered 2018-07-09 – 2018-07-11 (×7): 100 mg via ORAL
  Filled 2018-07-09 (×7): qty 2

## 2018-07-09 MED ORDER — SODIUM CHLORIDE 0.9% IV SOLUTION
Freq: Once | INTRAVENOUS | Status: AC
  Administered 2018-07-09: 11:00:00 via INTRAVENOUS

## 2018-07-09 MED ORDER — ISOSORBIDE MONONITRATE ER 60 MG PO TB24
120.0000 mg | ORAL_TABLET | Freq: Every day | ORAL | Status: DC
  Administered 2018-07-09 – 2018-07-11 (×3): 120 mg via ORAL
  Filled 2018-07-09 (×3): qty 2

## 2018-07-09 NOTE — Progress Notes (Signed)
Family Medicine Teaching Service Daily Progress Note Intern Pager: [phone]  Patient name: NIRANJAN RUFENER Medical record number: [id number] Date of birth: 1951-01-16 Age: 68 y.o. Gender: male  Primary Care Provider: Kathrene Alu, MD Consultants: GI Code Status: Full  Pt Overview and Major Events to Date:  6/2 - admit for GI bleed  Assessment and Plan: RYLEI CODISPOTI is a 68 y.o. male presenting with bloody emesis and hemoglobin of 4.8.  His previous medical history significant for gastric adenocarcinoma, heart failure with preserved ejection fraction, recurrent GI bleed, CKD, hypertension, hypothyroidism, anemia.  Upper GI bleed likely secondary to gastric cancer Per note from radiation oncology on 6/4, radiation is expected to be curative treatment for Mr. Nier.  Radiation oncology does not expect to see significant improvement in his GI bleeding for at least 2 weeks.  In the meantime, Mr. Oquendo does remain at high risk for readmission due to GI bleed.  The plan remains to ensure that he is stable and then return him to SNF where he can continue to receive radiation therapy. - S/p 3 units packed RBCs - transfuse one additional unit RBCs - Daily CBC - Protonix IV, transition to p.o. - Monitor vitals every 4 hours - Holding ASA consider restarting on an outpatient basis -Tylenol as needed  Diet controlled DMT2 HgA1C 6.5 in 06/2018.  Serum glucose 217 on BMP today. - monitor serum glucose  Acute on CKD stage IV Baseline creatinine around 3.  Creatinine 3.1 (6/4)> 3.29 (6/5) -Daily BMP  Gastric adenocarcinoma Per oncology note on 6/2, stage I, T2N0M0 adenocarcinoma of the stomach.    See upper GI bleed above for details.  Patient only candidate for palliative radiation therapy. -Continue to follow outpatient with oncology  Heart failure with preserved ejection fraction, chronic Euvolemic on exam this morning.  Euvolemic on exam this morning.  Blood pressure and heart  rate appear to be back to normal (normal range heart rate, hypertensive off of some home medication). -Restart torsemide 40 -Restart Coreg -Restart Imdur  Hypertension, chronic Systolic pressures 944-967 in the past 24 hours, diastolic pressures 59-16. -Clonidine -Amlodipine 5 mg daily -Restart hydralazine  Chronic anemia -Continue iron supplements once taking p.o.  CAD s/p CABG Her medication includes aspirin 81 mg daily and atorvastatin. -Continue atorvastatin -Hold aspirin in the setting of GI bleed  Hypothyroidism -Synthroid 100 mg daily   COPD -Continue glycopyrrolate -Duo nebs PRN  BPH -doxazosin   FEN/GI: NPO Prophylaxis: SCD (holding anticoagulation due to GI bleed)  Disposition: Plan to DC to [facility] on 6/6  Subjective:  Not present in the room this morning.  Patient had been moved to [facility] long for his radiation therapy.  Objective: Temp:  [98.9 F (37.2 C)-99.6 F (37.6 C)] 99.6 F (37.6 C) (06/05 0414) Pulse Rate:  [72-77] 72 (06/05 0414) Resp:  [18] 18 (06/04 1657) BP: (149-170)/(57-65) 157/65 (06/05 0414) SpO2:  [96 %-98 %] 96 % (06/05 0414) Weight:  [78.5 kg] 78.5 kg (06/04 2007)  Physical Exam: Patient not in the room this morning due to being transferred to [facility] long radiation therapy.   Laboratory: Recent Labs  Lab 07/07/18 0356 07/07/18 1220 07/08/18 0702 07/09/18 0159  WBC 9.1  --  7.0 6.4  HGB 6.6* 8.0* 7.4* 7.3*  HCT 20.5* 24.0* 22.4* 21.7*  PLT 133*  --  132* 142*   Recent Labs  Lab 07/06/18 1517 07/07/18 0356 07/08/18 0702 07/09/18 0159  NA 139 141 140 140  K 4.1 4.3  4.0 3.8  CL 108 110 111 109  CO2 22 17* 21* 20*  BUN 86* 82* 71* 65*  CREATININE 3.53* 3.38* 3.11* 3.29*  CALCIUM 8.4* 8.3* 8.3* 8.2*  PROT 5.2*  --   --   --   BILITOT 0.3  --   --   --   ALKPHOS 81  --   --   --   ALT 9  --   --   --   AST 9*  --   --   --   GLUCOSE 247* 229* 193* 217*     Imaging/Diagnostic Tests: No results  found.    Matilde Haymaker, MD 07/09/2018, 5:30 AM PGY-1, [place] Intern pager: [phone], text pages welcome

## 2018-07-09 NOTE — Progress Notes (Signed)
Spoke with RN Wyvonnia Lora regarding Leonard [name]'s upcoming radiation treatment today at 2:30 pm.  She let me know that the patient is currently receiving blood and it would be cutting it close to treatment time.  I asked that she give [facility] a call to coordinate transport here once his blood is finished.  Will continue to follow as necessary.  Gloriajean Dell. Leonie Green, BSN

## 2018-07-10 ENCOUNTER — Encounter (HOSPITAL_COMMUNITY): Payer: Self-pay | Admitting: *Deleted

## 2018-07-10 DIAGNOSIS — C163 Malignant neoplasm of pyloric antrum: Secondary | ICD-10-CM

## 2018-07-10 LAB — BPAM RBC
Blood Product Expiration Date: 202006122359
Blood Product Expiration Date: 202006122359
Blood Product Expiration Date: 202006232359
Blood Product Expiration Date: 202006262359
ISSUE DATE / TIME: 202006022041
ISSUE DATE / TIME: 202006022352
ISSUE DATE / TIME: 202006030818
ISSUE DATE / TIME: 202006051104
Unit Type and Rh: 9500
Unit Type and Rh: 9500
Unit Type and Rh: 9500
Unit Type and Rh: 9500

## 2018-07-10 LAB — TYPE AND SCREEN
ABO/RH(D): B NEG
Antibody Screen: NEGATIVE
Unit division: 0
Unit division: 0
Unit division: 0
Unit division: 0

## 2018-07-10 LAB — BASIC METABOLIC PANEL
Anion gap: 10 (ref 5–15)
BUN: 67 mg/dL — ABNORMAL HIGH (ref 8–23)
CO2: 21 mmol/L — ABNORMAL LOW (ref 22–32)
Calcium: 8.2 mg/dL — ABNORMAL LOW (ref 8.9–10.3)
Chloride: 107 mmol/L (ref 98–111)
Creatinine, Ser: 3.57 mg/dL — ABNORMAL HIGH (ref 0.61–1.24)
GFR calc Af Amer: 19 mL/min — ABNORMAL LOW (ref >60)
GFR calc non Af Amer: 17 mL/min — ABNORMAL LOW (ref >60)
Glucose, Bld: 192 mg/dL — ABNORMAL HIGH (ref 70–99)
Potassium: 3.9 mmol/L (ref 3.5–5.1)
Sodium: 138 mmol/L (ref 135–145)

## 2018-07-10 LAB — CBC
HCT: 23 % — ABNORMAL LOW (ref 39.0–52.0)
Hemoglobin: 7.7 g/dL — ABNORMAL LOW (ref 13.0–17.0)
MCH: 28.9 pg (ref 26.0–34.0)
MCHC: 33.5 g/dL (ref 30.0–36.0)
MCV: 86.5 fL (ref 80.0–100.0)
Platelets: 140 10*3/uL — ABNORMAL LOW (ref 150–400)
RBC: 2.66 MIL/uL — ABNORMAL LOW (ref 4.22–5.81)
RDW: 15 % (ref 11.5–15.5)
WBC: 5.6 10*3/uL (ref 4.0–10.5)
nRBC: 0 % (ref 0.0–0.2)

## 2018-07-10 LAB — SARS CORONAVIRUS 2 BY RT PCR (HOSPITAL ORDER, PERFORMED IN ~~LOC~~ HOSPITAL LAB): SARS Coronavirus 2: NEGATIVE

## 2018-07-10 MED ORDER — TORSEMIDE 20 MG PO TABS
20.0000 mg | ORAL_TABLET | Freq: Every day | ORAL | Status: DC
  Administered 2018-07-11: 20 mg via ORAL
  Filled 2018-07-10: qty 1

## 2018-07-10 MED ORDER — PANTOPRAZOLE SODIUM 40 MG PO TBEC
40.0000 mg | DELAYED_RELEASE_TABLET | Freq: Two times a day (BID) | ORAL | Status: DC
  Administered 2018-07-10 – 2018-07-11 (×3): 40 mg via ORAL
  Filled 2018-07-10 (×3): qty 1

## 2018-07-10 NOTE — Progress Notes (Signed)
CSW called and spoke with Jackelyn Poling who is on-call for [facility] this weekend. Debbie asked if the patient was ready, she stated that the patient will need a negative COVID screen within 48 hours. CSW asked if the facility needed PT/OT evaluations, Jackelyn Poling stated that they would. The patient would go to room 107 once he is ready for discharge. CSW stated that she would call Debbie back once the patient was ready for discharge.   CSW called and spoke with Dr. Pilar Plate. CSW stated that the facility will need a negative COVID screen and PT/OT evaluations before the facility will be able to accept him back.   CSW will continue to follow and assist with discharge.   Domenic Schwab, MSW, [place]

## 2018-07-10 NOTE — Progress Notes (Signed)
Family Medicine Teaching Service Daily Progress Note Intern Pager: [phone]  [name] Medical record number: [id number] Date of birth: 1950-11-05 Age: 68 y.o. Gender: male  Primary Care Provider: Kathrene Alu, MD Consultants: GI Code Status: Full  Pt Overview and Major Events to Date:  6/2 - admit for GI bleed  Assessment and Plan: Leonard [name] is a 68 y.o. male presenting with bloody emesis and hemoglobin of 4.8.  His previous medical history significant for gastric adenocarcinoma, heart failure with preserved ejection fraction, recurrent GI bleed, CKD, hypertension, hypothyroidism, anemia.  Upper GI bleed likely secondary to gastric cancer Hgb 8.0>7.4>7.3>8.1 (s/p 1 unit)> 7.7.  Hemoglobin appears to continue to downtrend slightly day-to-day.  Will continue to observe for 1 more day before discharge back to residence.  We will continue to receive radiation therapy at [facility] long. - S/p 4 units packed RBCs - Daily CBC - Protonix p.o. - Holding ASA consider restarting on an outpatient basis -Tylenol as needed  Diet controlled DMT2 HgA1C 6.5 in 06/2018.  Serum glucose 192 this morning. - monitor serum glucose  Acute on CKD stage IV Baseline creatinine around 3.  Creatinine 3.1 (6/4)> 3.29 (6/5)> 3.5 -Daily BMP -Reduce torsemide to 20 mg daily  Gastric adenocarcinoma Per oncology note on 6/2, stage I, T2N0M0 adenocarcinoma of the stomach.    See upper GI bleed above for details.  Patient only candidate for palliative radiation therapy. -Continue to follow outpatient with oncology  Heart failure with preserved ejection fraction, chronic Euvolemic on exam this morning.  Euvolemic on exam this morning.  Blood pressure and heart rate appear to be back to normal (normal range heart rate, hypertensive off of some home medication). - torsemide 40 daily - Coreg - Imdur  Hypertension, chronic Systolic pressures 381-829 in the past 24 hours, diastolic  pressures 93-71. -Clonidine -Amlodipine 5 mg daily -hydralazine  Chronic anemia -Continue iron supplements once taking p.o.  CAD s/p CABG Her medication includes aspirin 81 mg daily and atorvastatin. -Continue atorvastatin -Hold aspirin in the setting of GI bleed  Hypothyroidism -Synthroid 100 mg daily   COPD -Continue glycopyrrolate -Duo nebs PRN  BPH -doxazosin   FEN/GI: NPO Prophylaxis: SCD (holding anticoagulation due to GI bleed)  Disposition: Plan to DC to [facility] on 6/6  Subjective:  No acute events overnight.  No new concerns this morning.  Specifically denies stomach pain, nausea, chest pain, shortness of breath.  Objective: Temp:  [98.1 F (36.7 C)-99.8 F (37.7 C)] 98.5 F (36.9 C) (06/06 1002) Pulse Rate:  [67-77] 67 (06/06 1002) Resp:  [18-20] 18 (06/06 0534) BP: (137-172)/(60-68) 153/61 (06/06 1002) SpO2:  [97 %-100 %] 97 % (06/06 1002) Weight:  [78.6 kg] 78.6 kg (06/05 2133)  Physical Exam: General: Alert and cooperative and appears to be in no acute distress. HEENT: Neck non-tender without lymphadenopathy, masses or thyromegaly Cardio: Normal S1 and S2, no S3 or S4. Rhythm is regular.   Pulm: Clear to auscultation bilaterally, no crackles, wheezing, or diminished breath sounds. Normal respiratory effort Abdomen: Bowel sounds normal. Abdomen soft and non-tender.  Extremities: No peripheral edema. Warm/ well perfused.  Strong radial pulse. Neuro: Cranial nerves grossly intact    Laboratory: Recent Labs  Lab 07/08/18 0702 07/09/18 0159 07/09/18 1511 07/10/18 0230  WBC 7.0 6.4  --  5.6  HGB 7.4* 7.3* 8.1* 7.7*  HCT 22.4* 21.7* 23.9* 23.0*  PLT 132* 142*  --  140*   Recent Labs  Lab 07/06/18 1517  07/08/18 0702 07/09/18 0159 07/10/18 0230  NA 139   < > 140 140 138  K 4.1   < > 4.0 3.8 3.9  CL 108   < > 111 109 107  CO2 22   < > 21* 20* 21*  BUN 86*   < > 71* 65* 67*  CREATININE 3.53*   < > 3.11* 3.29* 3.57*  CALCIUM 8.4*    < > 8.3* 8.2* 8.2*  PROT 5.2*  --   --   --   --   BILITOT 0.3  --   --   --   --   ALKPHOS 81  --   --   --   --   ALT 9  --   --   --   --   AST 9*  --   --   --   --   GLUCOSE 247*   < > 193* 217* 192*   < > = values in this interval not displayed.     Imaging/Diagnostic Tests: No results found.    Matilde Haymaker, MD 07/10/2018, 10:47 AM PGY-1, [place] Intern pager: [phone], text pages welcome

## 2018-07-10 NOTE — NC FL2 (Signed)
[place] LEVEL OF CARE SCREENING TOOL     IDENTIFICATION  Patient Name: DEONTAYE CIVELLO Birthdate: 02-01-1951 Sex: male Admission Date (Current Location): 07/06/2018  [place] and [place] Number:  Kathleen Argue [id number] [place] and Address:  [facility], [place] [address] [place], [place] [address]      Provider Number: [id number]  Attending Physician Name and Address:  Alveda Reasons, MD  Relative Name and Phone Number:  Guadlupe Spanish, [phone]    Current Level of Care:   Recommended Level of Care: [place] Prior Approval Number:    Date Approved/Denied:   PASRR Number: [id number]  Discharge Plan: SNF    Current Diagnoses: Patient Active Problem List   Diagnosis Date Noted  . Chronic blood loss anemia   . GI bleed 06/23/2018  . Acute renal failure superimposed on chronic kidney disease ([place])   . Abdominal pain   . DNR (do not resuscitate)   . 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[place])   . DNR (do not resuscitate) discussion   . Anemia 03/29/2018  . Acute GI bleeding 03/28/2018  . Dysphagia 10/01/2017  . Palliative care by specialist   . Severe comorbid illness   . Moderate malnutrition ([place]) 08/27/2017  . Hypoxemia   . Atelectasis   . Chronic respiratory failure with hypoxia ([place]) 08/25/2017  . Pulmonary artery hypertension ([place]) 07/25/2017  . Pressure injury of skin 07/20/2017  . Goals of care, counseling/discussion 02/11/2017  . Diabetic polyneuropathy associated with type 2 diabetes mellitus ([place]) 01/14/2017  . Pleural effusion   . Chronic anemia 02/28/2016  . Weakness generalized 01/21/2016  . Diabetic retinopathy ([place]) 11/13/2015  . Iron deficiency anemia 08/03/2015  . Type II diabetes mellitus with complication ([place])   . Chronic kidney disease (CKD), stage III (moderate) (HCC)   . MGUS (monoclonal gammopathy of unknown significance) 01/15/2015  . Deficiency anemia 01/15/2015  . CKD  (chronic kidney disease) stage 4, GFR 15-29 ml/min (HCC) 01/15/2015  . Hypothyroidism 11/22/2014  . COPD GOLD III  02/16/2014  . Anemia of renal disease   . Chronic diastolic CHF (congestive heart failure) ([place])   . History of tobacco use 08/23/2013  . 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 CAD (coronary artery disease) 04/06/2013  . PVD - hx of Rt SFA PTA and s/p Rt 4-5th toe amp Dec 2014 04/05/2013  . 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 Mixed hyperlipidemia 07/17/2008    Orientation RESPIRATION BLADDER Height & Weight     Self, Time, Situation, Place  Normal Incontinent, External catheter Weight: 173 lb 4.5 oz (78.6 kg) Height:  5\' 8"  (172.7 cm)  BEHAVIORAL SYMPTOMS/MOOD NEUROLOGICAL BOWEL NUTRITION STATUS      Incontinent Diet(regular diet, thin liquids)  AMBULATORY STATUS COMMUNICATION OF NEEDS Skin   Limited Assist Verbally Normal, Other (Comment)(Dry skin, toe amputations)                       Personal Care Assistance Level of Assistance  Bathing, Feeding, Dressing, Total care Bathing Assistance: Limited assistance Feeding assistance: Independent Dressing Assistance: Limited assistance Total Care Assistance: Limited assistance   Functional Limitations Info  Sight, Hearing, Speech Sight Info: Adequate Hearing Info: Impaired Speech Info: Adequate    SPECIAL CARE FACTORS FREQUENCY  PT (By licensed PT), OT (By licensed OT)     PT Frequency: 5x/wk OT Frequency: 5x/wk            Contractures Contractures Info: Not present  Additional Factors Info  Code Status Code Status Info: Full Code Allergies Info: Penicillins           Current Medications (07/10/2018):  This is the current hospital active medication list Current Facility-Administered Medications  Medication Dose Route Frequency Provider Last Rate Last Dose  . acetaminophen (TYLENOL) tablet 650 mg  650 mg Oral Q8H PRN Matilde Haymaker, MD      . amLODipine (NORVASC) tablet 5 mg  5 mg Oral Daily Tammi Klippel,  Sherin, DO   5 mg at 07/10/18 4315  . atorvastatin (LIPITOR) tablet 40 mg  40 mg Oral q1800 Matilde Haymaker, MD   40 mg at 07/09/18 1702  . carvedilol (COREG) tablet 12.5 mg  12.5 mg Oral BID WC Matilde Haymaker, MD   12.5 mg at 07/10/18 0942  . cholecalciferol (VITAMIN D) tablet 2,000 Units  2,000 Units Oral TID Matilde Haymaker, MD   2,000 Units at 07/10/18 [id number]  . [START ON 07/13/2018] cloNIDine (CATAPRES - Dosed in mg/24 hr) patch 0.2 mg  0.2 mg Transdermal Q Arlana Lindau, MD      . doxazosin (CARDURA) tablet 8 mg  8 mg Oral Daily Matilde Haymaker, MD   8 mg at 07/10/18 6761  . feeding supplement (ENSURE ENLIVE) (ENSURE ENLIVE) liquid 237 mL  237 mL Oral TID BM Matilde Haymaker, MD   237 mL at 07/09/18 2246  . ferrous sulfate tablet 325 mg  325 mg Oral Daily Matilde Haymaker, MD   325 mg at 07/10/18 9509  . hydrALAZINE (APRESOLINE) tablet 100 mg  100 mg Oral Q8H Matilde Haymaker, MD   100 mg at 07/09/18 2241  . ipratropium-albuterol (DUONEB) 0.5-2.5 (3) MG/3ML nebulizer solution 3 mL  3 mL Nebulization Q6H PRN Matilde Haymaker, MD      . isosorbide mononitrate (IMDUR) 24 hr tablet 120 mg  120 mg Oral Daily Matilde Haymaker, MD   120 mg at 07/10/18 0942  . levothyroxine (SYNTHROID) tablet 100 mcg  100 mcg Oral QAC breakfast Matilde Haymaker, MD   100 mcg at 07/09/18 3267  . ondansetron (ZOFRAN) tablet 4 mg  4 mg Oral Q6H PRN Matilde Haymaker, MD       Or  . ondansetron [facility]) injection 4 mg  4 mg Intravenous Q6H PRN Matilde Haymaker, MD      . pantoprazole (PROTONIX) injection 40 mg  40 mg Intravenous Q12H Abraham, Sherin, DO   40 mg at 07/09/18 2240  . torsemide (DEMADEX) tablet 40 mg  40 mg Oral Daily Tammi Klippel, Sherin, DO   40 mg at 07/10/18 0943  . umeclidinium bromide (INCRUSE ELLIPTA) 62.5 MCG/INH 1 puff  1 puff Inhalation Daily Matilde Haymaker, MD   1 puff at 07/09/18 1340   Facility-Administered Medications Ordered in Other Encounters  Medication Dose Route Frequency Provider Last Rate Last Dose  . sodium chloride flush (NS) 0.9 %  injection 10 mL  10 mL Intracatheter PRN Heath Lark, MD         Discharge Medications: Please see discharge summary for a list of discharge medications.  Relevant Imaging Results:  Relevant Lab Results:   Additional Information SSN: [id number]  Philippa Chester Emine Lopata, LCSWA

## 2018-07-10 NOTE — Discharge Summary (Signed)
[facility] Discharge Summary  [name] Medical record number: [id number] Date of birth: 1950-11-13 Age: 68 y.o. 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 His previous medical history significant for gastric adenocarcinoma, heart failure with preserved ejection fraction, recurrent GI bleed, CKD, hypertension, hypothyroidism, anemia.  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  GI was consulted and noted that Leonard [name] bleeding is not amenable to endoscopy.  His oncologist noted that he has previously been assessed multiple times and he is not a chemotherapy candidate nor a surgical candidate for resection.  It was decided that the most helpful way forward would be palliative radiation therapy.  During his time in the hospital, he was stabilized with 4 units of packed RBCs while he received daily radiation therapy at [facility].  Once he demonstrated that his hemoglobin is stable, he would be discharged back to his residence with daily  treatments of radiation therapy for 5 and half weeks.  On the day of discharge, he was found to have a stable hemoglobin of 8.1.  As estimated by radiation oncology that he would likely not see benefit from the radiation therapy until about 2 weeks into his treatment.  Until at least 2 weeks into his radiation therapy, he may continue to experience significant GI bleed.  We recommend that he has every other day H/H labs drawn to assess his blood level.  Issues for Follow Up:  1. Every other day H/H lab to assess hemoglobin for at least 2 weeks following hospital discharge. Threshold 8 per cardiac recommendations, 7 per GI recommendations 2. Follow-up with oncology 3. Follow-up with radiation oncology. Will need daily transport to [facility] for radiation therapy.  4. ASA discontinued, consider restarting   Significant Procedures:  Every other day radiation therapy at [facility].  Significant Labs and Imaging:  Recent Labs  Lab 07/09/18 0159 07/09/18 1511 07/10/18 0230 07/11/18 0454  WBC 6.4  --  5.6 5.0  HGB 7.3* 8.1* 7.7* 8.1*  HCT 21.7* 23.9* 23.0* 24.4*  PLT 142*  --  140* 161   Recent Labs  Lab 07/06/18 1517 07/07/18 0356 07/08/18 0702 07/09/18 0159 07/10/18 0230 07/11/18 0454  NA 139 141 140 140 138 139  K 4.1 4.3 4.0 3.8 3.9 4.1  CL 108 110 111 109 107 106  CO2 22 17* 21* 20* 21* 21*  GLUCOSE 247* 229* 193* 217* 192* 222*  BUN 86* 82* 71* 65* 67* 69*  CREATININE 3.53* 3.38* 3.11* 3.29* 3.57* 3.54*  CALCIUM 8.4* 8.3* 8.3* 8.2* 8.2* 8.5*  ALKPHOS 81  --   --   --   --   --   AST 9*  --   --   --   --   --  ALT 9  --   --   --   --   --   ALBUMIN 2.9*  --   --   --   --   --      Results/Tests Pending at Time of Discharge: None  Discharge Medications:  Allergies as of 07/11/2018      Reactions   Penicillins Other (See Comments)   Has patient had a PCN reaction causing immediate rash, facial/tongue/throat swelling, SOB or lightheadedness with hypotension:  Unknown Has patient had a PCN reaction causing severe rash involving mucus membranes or skin necrosis: Unknown Has patient had a PCN reaction that required hospitalization: Unknown Has patient had a PCN reaction occurring within the last 10 years: Unknown If all of the above answers are "NO", then may proceed with Cephalosporin use.      Medication List    STOP taking these medications   aspirin EC 81 MG tablet   insulin aspart 100 UNIT/ML injection Commonly known as:  novoLOG   potassium chloride SA 20 MEQ tablet Commonly known as:  K-DUR     TAKE these medications   acetaminophen 325 MG tablet Commonly known as:  TYLENOL Take 650 mg by mouth every 8 (eight) hours as needed for mild pain or fever.   amLODipine 5 MG tablet Commonly known as:  NORVASC Take 1 tablet (5 mg total) by mouth daily. Start taking on:  July 12, 2018 What changed:    medication strength  how much to take   atorvastatin 40 MG tablet Commonly known as:  LIPITOR TAKE ONE (1) TABLET BY MOUTH EVERY DAY AT 6:00PM What changed:  See the new instructions.   carvedilol 12.5 MG tablet Commonly known as:  COREG Take 1 tablet (12.5 mg total) by mouth 2 (two) times daily with a meal.   cholecalciferol 25 MCG (1000 UT) tablet Commonly known as:  VITAMIN D Take 2,000 Units by mouth 3 (three) times daily.   cloNIDine 0.2 mg/24hr patch Commonly known as:  CATAPRES - Dosed in mg/24 hr Place 1 patch (0.2 mg total) onto the skin once a week. What changed:  when to take this   Darbepoetin Alfa 40 MCG/0.4ML Sosy injection Commonly known as:  ARANESP Inject 0.4 mLs (40 mcg total) into the skin every 30 (thirty) days.   doxazosin 8 MG tablet Commonly known as:  CARDURA Take 1 tablet (8 mg total) by mouth every evening. What changed:  when to take this   feeding supplement (ENSURE ENLIVE) Liqd Take 237 mLs by mouth 3 (three) times daily between meals.   ferrous sulfate 325 (65 FE) MG tablet Commonly  known as:  FeroSul Take 1 tablet (325 mg total) by mouth daily at 12 noon. What changed:  when to take this   glucose blood test strip Commonly known as:  OneTouch Verio Use as instructed to test three times daily. ICD-10 code: E11.22   hydrALAZINE 100 MG tablet Commonly known as:  APRESOLINE TAKE ONE TABLET BY MOUTH EVERY EIGHT HOURS What changed:  See the new instructions.   ipratropium-albuterol 0.5-2.5 (3) MG/3ML Soln Commonly known as:  DUONEB Take 3 mLs by nebulization every 6 (six) hours as needed (shortness of breath and wheezing).   isosorbide mononitrate 120 MG 24 hr tablet Commonly known as:  IMDUR Take 1 tablet (120 mg total) by mouth daily.   levothyroxine 100 MCG tablet Commonly known as:  SYNTHROID Take 100 mcg by mouth daily before breakfast.   Carma Leaven Starter  Kit 25 MCG/ML Soln Generic drug:  Glycopyrrolate Inhale 1 mL into the lungs 2 (two) times a day.   nitroGLYCERIN 0.4 MG SL tablet Commonly known as:  NITROSTAT Place 1 tablet (0.4 mg total) under the tongue every 5 (five) minutes as needed for chest pain.   ondansetron 4 MG tablet Commonly known as:  ZOFRAN Take 4 mg by mouth every 8 (eight) hours as needed for nausea or vomiting.   OneTouch Delica Lancets 94L Misc 1 each by Does not apply route 3 (three) times daily. ICD-10 code: E11.22   OneTouch Verio Flex System w/Device Kit 1 kit by Does not apply route daily.   pantoprazole 40 MG tablet Commonly known as:  PROTONIX Take 1 tablet (40 mg total) by mouth 2 (two) times daily.   polyethylene glycol 17 g packet Commonly known as:  MIRALAX / GLYCOLAX Take 17 g by mouth daily as needed for mild constipation or moderate constipation. What changed:  reasons to take this   torsemide 20 MG tablet Commonly known as:  DEMADEX Take 2 tablets (40 mg total) by mouth daily.   umeclidinium bromide 62.5 MCG/INH Aepb Commonly known as:  INCRUSE ELLIPTA Inhale 1 puff into the lungs daily.        Discharge Instructions: Please refer to Patient Instructions section of EMR for full details.  Patient was counseled important signs and symptoms that should prompt return to medical care, changes in medications, dietary instructions, activity restrictions, and follow up appointments.   Follow-Up Appointments: Follow-up Information    Winfrey, Alcario Drought, MD. Schedule an appointment as soon as possible for a visit in 1 week(s).   Specialty:  Family Medicine Contact information: [address] [place] [place] [address] [place], [place], DO 07/11/2018, 12:10 PM PGY-2, [place]

## 2018-07-10 NOTE — Evaluation (Signed)
Physical Therapy Evaluation Patient Details Name: Leonard [name] DOBOSZ MRN: [id number] DOB: [birth year] Today's Date: 07/10/2018   History of Present Illness  Pt adm with upper GI bleed due to gastic CA. Hgb of 4.3 on admission. PMH of CKD IV, gastric adneocarcinoma, hypothyroidism,  CAD s/p CABG, and PVD s/p left AKA  Clinical Impression  Pt admitted with above diagnosis and presents to PT with functional limitations due to deficits listed below (See PT problem list). Pt needs skilled PT to maximize independence and safety to allow discharge back to SNF to continue rehab.      Follow Up Recommendations SNF    Equipment Recommendations  None recommended by PT    Recommendations for Other Services       Precautions / Restrictions Precautions Precautions: Fall      Mobility  Bed Mobility Overal bed mobility: Needs Assistance Bed Mobility: Supine to Sit;Sit to Supine     Supine to sit: Min assist;HOB elevated Sit to supine: Min guard   General bed mobility comments: Assist to elevate trunk into sitting  Transfers Overall transfer level: Needs assistance Equipment used: Rolling walker (2 wheeled) Transfers: Sit to/from Stand           General transfer comment: Unable to achieve standing with 1 person assist. Rt foot sliding forward on floor  Ambulation/Gait                Stairs            Wheelchair Mobility    Modified Rankin (Stroke Patients Only)       Balance Overall balance assessment: Needs assistance Sitting-balance support: No upper extremity supported;Feet supported Sitting balance-Leahy Scale: Fair                                       Pertinent Vitals/Pain Pain Assessment: No/denies pain    Home Living Family/patient expects to be discharged to:: Skilled nursing facility                      Prior Function Level of Independence: Needs assistance   Gait / Transfers Assistance Needed: Requiring assist for  transfers at SNF           Hand Dominance   Dominant Hand: Right    Extremity/Trunk Assessment   Upper Extremity Assessment Upper Extremity Assessment: Defer to OT evaluation    Lower Extremity Assessment Lower Extremity Assessment: Generalized weakness;LLE deficits/detail LLE Deficits / Details: AKA       Communication   Communication: HOH  Cognition Arousal/Alertness: Awake/alert Behavior During Therapy: WFL for tasks assessed/performed Overall Cognitive Status: No family/caregiver present to determine baseline cognitive functioning Area of Impairment: Problem solving                             Problem Solving: Slow processing;Requires verbal cues;Requires tactile cues        General Comments      Exercises     Assessment/Plan    PT Assessment Patient needs continued PT services  PT Problem List Decreased strength;Decreased activity tolerance;Decreased balance;Decreased mobility       PT Treatment Interventions DME instruction;Therapeutic activities;Therapeutic exercise;Patient/family education;Balance training;Functional mobility training    PT Goals (Current goals can be found in the Care Plan section)  Acute Rehab PT Goals Patient Stated Goal: not stated PT Goal Formulation: With  patient Time For Goal Achievement: 07/24/18 Potential to Achieve Goals: Good    Frequency Min 2X/week   Barriers to discharge        Co-evaluation               AM-PAC PT "6 Clicks" Mobility  Outcome Measure Help needed turning from your back to your side while in a flat bed without using bedrails?: A Little Help needed moving from lying on your back to sitting on the side of a flat bed without using bedrails?: A Little Help needed moving to and from a bed to a chair (including a wheelchair)?: Total Help needed standing up from a chair using your arms (e.g., wheelchair or bedside chair)?: Total Help needed to walk in hospital room?: Total Help  needed climbing 3-5 steps with a railing? : Total 6 Click Score: 10    End of Session Equipment Utilized During Treatment: Gait belt Activity Tolerance: Patient tolerated treatment well Patient left: in bed;with call bell/phone within reach;with bed alarm set   PT Visit Diagnosis: Other abnormalities of gait and mobility (R26.89);Muscle weakness (generalized) (M62.81)    Time: 2919-1660 PT Time Calculation (min) (ACUTE ONLY): 15 min   Charges:   PT Evaluation $PT Eval Moderate Complexity: [place] Pager [phone] Office [place] 07/10/2018, 2:08 PM

## 2018-07-11 DIAGNOSIS — D472 Monoclonal gammopathy: Secondary | ICD-10-CM | POA: Diagnosis present

## 2018-07-11 DIAGNOSIS — E1151 Type 2 diabetes mellitus with diabetic peripheral angiopathy without gangrene: Secondary | ICD-10-CM | POA: Diagnosis present

## 2018-07-11 DIAGNOSIS — D649 Anemia, unspecified: Secondary | ICD-10-CM | POA: Diagnosis not present

## 2018-07-11 DIAGNOSIS — I959 Hypotension, unspecified: Secondary | ICD-10-CM | POA: Diagnosis not present

## 2018-07-11 DIAGNOSIS — Z03818 Encounter for observation for suspected exposure to other biological agents ruled out: Secondary | ICD-10-CM | POA: Diagnosis not present

## 2018-07-11 DIAGNOSIS — E1142 Type 2 diabetes mellitus with diabetic polyneuropathy: Secondary | ICD-10-CM | POA: Diagnosis not present

## 2018-07-11 DIAGNOSIS — C163 Malignant neoplasm of pyloric antrum: Secondary | ICD-10-CM | POA: Diagnosis not present

## 2018-07-11 DIAGNOSIS — E1165 Type 2 diabetes mellitus with hyperglycemia: Secondary | ICD-10-CM | POA: Diagnosis not present

## 2018-07-11 DIAGNOSIS — M2550 Pain in unspecified joint: Secondary | ICD-10-CM | POA: Diagnosis not present

## 2018-07-11 DIAGNOSIS — R7889 Finding of other specified substances, not normally found in blood: Secondary | ICD-10-CM | POA: Diagnosis not present

## 2018-07-11 DIAGNOSIS — R110 Nausea: Secondary | ICD-10-CM | POA: Diagnosis not present

## 2018-07-11 DIAGNOSIS — D500 Iron deficiency anemia secondary to blood loss (chronic): Secondary | ICD-10-CM | POA: Diagnosis present

## 2018-07-11 DIAGNOSIS — Z794 Long term (current) use of insulin: Secondary | ICD-10-CM | POA: Diagnosis not present

## 2018-07-11 DIAGNOSIS — Z515 Encounter for palliative care: Secondary | ICD-10-CM | POA: Diagnosis not present

## 2018-07-11 DIAGNOSIS — Z923 Personal history of irradiation: Secondary | ICD-10-CM | POA: Diagnosis not present

## 2018-07-11 DIAGNOSIS — Z89612 Acquired absence of left leg above knee: Secondary | ICD-10-CM | POA: Diagnosis not present

## 2018-07-11 DIAGNOSIS — N400 Enlarged prostate without lower urinary tract symptoms: Secondary | ICD-10-CM | POA: Diagnosis present

## 2018-07-11 DIAGNOSIS — E039 Hypothyroidism, unspecified: Secondary | ICD-10-CM | POA: Diagnosis present

## 2018-07-11 DIAGNOSIS — R278 Other lack of coordination: Secondary | ICD-10-CM | POA: Diagnosis present

## 2018-07-11 DIAGNOSIS — Z79899 Other long term (current) drug therapy: Secondary | ICD-10-CM | POA: Diagnosis not present

## 2018-07-11 DIAGNOSIS — I739 Peripheral vascular disease, unspecified: Secondary | ICD-10-CM | POA: Diagnosis present

## 2018-07-11 DIAGNOSIS — C170 Malignant neoplasm of duodenum: Secondary | ICD-10-CM | POA: Diagnosis present

## 2018-07-11 DIAGNOSIS — Z87891 Personal history of nicotine dependence: Secondary | ICD-10-CM | POA: Diagnosis not present

## 2018-07-11 DIAGNOSIS — E782 Mixed hyperlipidemia: Secondary | ICD-10-CM | POA: Diagnosis present

## 2018-07-11 DIAGNOSIS — K922 Gastrointestinal hemorrhage, unspecified: Secondary | ICD-10-CM | POA: Diagnosis not present

## 2018-07-11 DIAGNOSIS — I5032 Chronic diastolic (congestive) heart failure: Secondary | ICD-10-CM | POA: Diagnosis present

## 2018-07-11 DIAGNOSIS — I2510 Atherosclerotic heart disease of native coronary artery without angina pectoris: Secondary | ICD-10-CM | POA: Diagnosis not present

## 2018-07-11 DIAGNOSIS — Z8249 Family history of ischemic heart disease and other diseases of the circulatory system: Secondary | ICD-10-CM | POA: Diagnosis not present

## 2018-07-11 DIAGNOSIS — Z741 Need for assistance with personal care: Secondary | ICD-10-CM | POA: Diagnosis present

## 2018-07-11 DIAGNOSIS — J449 Chronic obstructive pulmonary disease, unspecified: Secondary | ICD-10-CM | POA: Diagnosis present

## 2018-07-11 DIAGNOSIS — K920 Hematemesis: Secondary | ICD-10-CM | POA: Diagnosis not present

## 2018-07-11 DIAGNOSIS — Z79890 Hormone replacement therapy (postmenopausal): Secondary | ICD-10-CM | POA: Diagnosis not present

## 2018-07-11 DIAGNOSIS — D62 Acute posthemorrhagic anemia: Secondary | ICD-10-CM | POA: Diagnosis not present

## 2018-07-11 DIAGNOSIS — Z85828 Personal history of other malignant neoplasm of skin: Secondary | ICD-10-CM | POA: Diagnosis not present

## 2018-07-11 DIAGNOSIS — R112 Nausea with vomiting, unspecified: Secondary | ICD-10-CM | POA: Diagnosis present

## 2018-07-11 DIAGNOSIS — N184 Chronic kidney disease, stage 4 (severe): Secondary | ICD-10-CM | POA: Diagnosis not present

## 2018-07-11 DIAGNOSIS — E038 Other specified hypothyroidism: Secondary | ICD-10-CM | POA: Diagnosis not present

## 2018-07-11 DIAGNOSIS — R1111 Vomiting without nausea: Secondary | ICD-10-CM | POA: Diagnosis not present

## 2018-07-11 DIAGNOSIS — D002 Carcinoma in situ of stomach: Secondary | ICD-10-CM | POA: Diagnosis not present

## 2018-07-11 DIAGNOSIS — C801 Malignant (primary) neoplasm, unspecified: Secondary | ICD-10-CM | POA: Diagnosis not present

## 2018-07-11 DIAGNOSIS — Z7951 Long term (current) use of inhaled steroids: Secondary | ICD-10-CM | POA: Diagnosis not present

## 2018-07-11 DIAGNOSIS — R41841 Cognitive communication deficit: Secondary | ICD-10-CM | POA: Diagnosis present

## 2018-07-11 DIAGNOSIS — R5381 Other malaise: Secondary | ICD-10-CM | POA: Diagnosis not present

## 2018-07-11 DIAGNOSIS — Z7189 Other specified counseling: Secondary | ICD-10-CM | POA: Diagnosis not present

## 2018-07-11 DIAGNOSIS — E11319 Type 2 diabetes mellitus with unspecified diabetic retinopathy without macular edema: Secondary | ICD-10-CM | POA: Diagnosis present

## 2018-07-11 DIAGNOSIS — Z951 Presence of aortocoronary bypass graft: Secondary | ICD-10-CM | POA: Diagnosis not present

## 2018-07-11 DIAGNOSIS — I130 Hypertensive heart and chronic kidney disease with heart failure and stage 1 through stage 4 chronic kidney disease, or unspecified chronic kidney disease: Secondary | ICD-10-CM | POA: Diagnosis present

## 2018-07-11 DIAGNOSIS — R2689 Other abnormalities of gait and mobility: Secondary | ICD-10-CM | POA: Diagnosis present

## 2018-07-11 DIAGNOSIS — R1312 Dysphagia, oropharyngeal phase: Secondary | ICD-10-CM | POA: Diagnosis present

## 2018-07-11 DIAGNOSIS — I10 Essential (primary) hypertension: Secondary | ICD-10-CM | POA: Diagnosis not present

## 2018-07-11 DIAGNOSIS — E1122 Type 2 diabetes mellitus with diabetic chronic kidney disease: Secondary | ICD-10-CM | POA: Diagnosis present

## 2018-07-11 DIAGNOSIS — Z7401 Bed confinement status: Secondary | ICD-10-CM | POA: Diagnosis not present

## 2018-07-11 DIAGNOSIS — Z1159 Encounter for screening for other viral diseases: Secondary | ICD-10-CM | POA: Diagnosis not present

## 2018-07-11 DIAGNOSIS — M6281 Muscle weakness (generalized): Secondary | ICD-10-CM | POA: Diagnosis present

## 2018-07-11 LAB — CBC
HCT: 24.4 % — ABNORMAL LOW (ref 39.0–52.0)
Hemoglobin: 8.1 g/dL — ABNORMAL LOW (ref 13.0–17.0)
MCH: 29.2 pg (ref 26.0–34.0)
MCHC: 33.2 g/dL (ref 30.0–36.0)
MCV: 88.1 fL (ref 80.0–100.0)
Platelets: 161 K/uL (ref 150–400)
RBC: 2.77 MIL/uL — ABNORMAL LOW (ref 4.22–5.81)
RDW: 14.5 % (ref 11.5–15.5)
WBC: 5 K/uL (ref 4.0–10.5)
nRBC: 0 % (ref 0.0–0.2)

## 2018-07-11 LAB — BASIC METABOLIC PANEL
Anion gap: 12 (ref 5–15)
BUN: 69 mg/dL — ABNORMAL HIGH (ref 8–23)
CO2: 21 mmol/L — ABNORMAL LOW (ref 22–32)
Calcium: 8.5 mg/dL — ABNORMAL LOW (ref 8.9–10.3)
Chloride: 106 mmol/L (ref 98–111)
Creatinine, Ser: 3.54 mg/dL — ABNORMAL HIGH (ref 0.61–1.24)
GFR calc Af Amer: 19 mL/min — ABNORMAL LOW (ref >60)
GFR calc non Af Amer: 17 mL/min — ABNORMAL LOW (ref >60)
Glucose, Bld: 222 mg/dL — ABNORMAL HIGH (ref 70–99)
Potassium: 4.1 mmol/L (ref 3.5–5.1)
Sodium: 139 mmol/L (ref 135–145)

## 2018-07-11 MED ORDER — AMLODIPINE BESYLATE 5 MG PO TABS: 5.0000 mg | ORAL_TABLET | Freq: Every day | ORAL | Status: AC

## 2018-07-11 NOTE — Evaluation (Signed)
Occupational Therapy Evaluation Patient Details Name: Leonard [name] MRN: [id number] DOB: Sep 08, 1950 Today's Date: 07/11/2018    History of Present Illness Pt adm with upper GI bleed due to gastic CA. Hgb of 4.3 on admission. PMH of CKD IV, gastric adneocarcinoma, hypothyroidism,  CAD s/p CABG, and PVD s/p left AKA   Clinical Impression   PT PTA: from SNF and performing transfers to w/c with assist. No prosthetic for L AKA. Pt currently minA for bed mobility, unable to come to standing with +1. Pt sat EOB for light grooming with fair balance with set-upA. Pt set-upA for UB ADL and LB ADL with minA. Pt donning socks at EOB, but fatigues easily. Pt would benefit from continued OT skilled services for ADL, mobility and HEP for strength. OT following acutely.      Follow Up Recommendations  SNF    Equipment Recommendations  None recommended by OT    Recommendations for Other Services       Precautions / Restrictions Precautions Precautions: Fall;Other (comment) Precaution Comments: contact Restrictions Weight Bearing Restrictions: No      Mobility Bed Mobility Overal bed mobility: Needs Assistance Bed Mobility: Supine to Sit;Sit to Supine     Supine to sit: Min assist;HOB elevated Sit to supine: Min guard   General bed mobility comments: Assist to elevate trunk into sitting  Transfers Overall transfer level: Needs assistance   Transfers: Sit to/from Stand           General transfer comment: Unable to achieve standing with 1 person assist. Rt foot sliding forward on floor and no shoe available.    Balance Overall balance assessment: Needs assistance Sitting-balance support: No upper extremity supported;Feet supported Sitting balance-Leahy Scale: Fair                                     ADL either performed or assessed with clinical judgement   ADL Overall ADL's : Needs assistance/impaired Eating/Feeding: Set up;Sitting   Grooming: Set  up;Sitting   Upper Body Bathing: Set up;Sitting   Lower Body Bathing: Minimal assistance;Sitting/lateral leans;Bed level   Upper Body Dressing : Set up;Sitting   Lower Body Dressing: Minimal assistance;Sitting/lateral leans;Sit to/from stand   Toilet Transfer: Moderate assistance;+2 for physical assistance;+2 for safety/equipment;BSC   Toileting- Clothing Manipulation and Hygiene: Maximal assistance;Bed level       Functional mobility during ADLs: Moderate assistance;+2 for physical assistance;+2 for safety/equipment General ADL Comments: Pt set-upA for UB ADL and LB ADL with minA at EOB or supine.      Vision Baseline Vision/History: No visual deficits Vision Assessment?: No apparent visual deficits     Perception     Praxis      Pertinent Vitals/Pain Pain Assessment: No/denies pain     Hand Dominance Right   Extremity/Trunk Assessment Upper Extremity Assessment Upper Extremity Assessment: Generalized weakness   Lower Extremity Assessment Lower Extremity Assessment: Generalized weakness LLE Deficits / Details: AKA   Cervical / Trunk Assessment Cervical / Trunk Assessment: Normal   Communication Communication Communication: HOH   Cognition Arousal/Alertness: Awake/alert Behavior During Therapy: WFL for tasks assessed/performed Overall Cognitive Status: Within Functional Limits for tasks assessed                                     General Comments  VSS    Exercises  Shoulder Instructions      Home Living Family/patient expects to be discharged to:: Skilled nursing facility                                 Additional Comments: Pt reports receiving therapy in SNF setting.      Prior Functioning/Environment Level of Independence: Needs assistance  Gait / Transfers Assistance Needed: Requiring assist for transfers at SNF ADL's / Homemaking Assistance Needed: Reports he requires some assist for ADLs             OT  Problem List: Decreased strength;Decreased range of motion;Decreased activity tolerance;Impaired balance (sitting and/or standing);Decreased safety awareness;Decreased knowledge of use of DME or AE      OT Treatment/Interventions: Self-care/ADL training;Therapeutic exercise;DME and/or AE instruction;Therapeutic activities;Balance training;Patient/family education    OT Goals(Current goals can be found in the care plan section) Acute Rehab OT Goals Patient Stated Goal: not stated OT Goal Formulation: With patient Time For Goal Achievement: 07/24/18 Potential to Achieve Goals: Good ADL Goals Pt Will Transfer to Toilet: squat pivot transfer;bedside commode;with mod assist Pt/caregiver will Perform Home Exercise Program: Both right and left upper extremity;With Supervision Additional ADL Goal #1: Pt will perform x10 mins of ADL tasks at EOB with good balance  OT Frequency: Min 2X/week   Barriers to D/C:            Co-evaluation              AM-PAC OT "6 Clicks" Daily Activity     Outcome Measure Help from another person eating meals?: None Help from another person taking care of personal grooming?: None Help from another person toileting, which includes using toliet, bedpan, or urinal?: Total Help from another person bathing (including washing, rinsing, drying)?: A Little Help from another person to put on and taking off regular upper body clothing?: None Help from another person to put on and taking off regular lower body clothing?: A Lot 6 Click Score: 18   End of Session Equipment Utilized During Treatment: Gait belt;Other (comment)(BSC) Nurse Communication: Mobility status  Activity Tolerance: Patient tolerated treatment well Patient left: in bed;with call bell/phone within reach;with bed alarm set  OT Visit Diagnosis: Unsteadiness on feet (R26.81);Muscle weakness (generalized) (M62.81)                Time: 0177-9390 OT Time Calculation (min): 20 min Charges:  OT  General Charges $OT Visit: 1 Visit OT Evaluation $OT Eval Moderate Complexity: 1 Mod  Darryl Nestle) Marsa Aris OTR/L Acute Rehabilitation Services Pager: [phone] Office: [place] 07/11/2018, 10:23 AM

## 2018-07-11 NOTE — Progress Notes (Signed)
Family Medicine Teaching Service Daily Progress Note Intern Pager: [phone]  [name] Medical record number: [id number] Date of birth: Jul 14, 1950 Age: 68 y.o. Gender: male  Primary Care Provider: Kathrene Alu, MD Consultants: GI, oncology, rad onc Code Status: Full   Pt Overview and Major Events to Date:  Admitted to FPTS on 6/2 for GI bleed   Assessment and Plan: Leonard Bruni Poseyis a 68 y.o.malepresenting with bloody emesis and hemoglobin of 4.8. His previous medical history significant for gastric adenocarcinoma, heart failure with preserved ejection fraction, recurrent GI bleed, CKD, hypertension, hypothyroidism, anemia.  UpperGI bleedlikely secondary to gastric cancer Hgb stable, and improved at 8.1 from 7.7 on 6/6. S/p 4U PRBCs throughout hospital course, most recently on 6/5. Reassuring that Hgb improved ON without need of any transfusions. Given improvement in Hgb can likely dc to SNF.  - Daily CBC - Protonix p.o. - Holding ASA, can consider restarting on an outpatient basis -Tylenol as needed  Diet controlled DMT2 HgA1C 6.5 in 06/2018.  Home SSI. Serum glucose 222 this morning. - monitor serum glucose  Acute on CKD stage IV Baseline creatinine around 3.  Creatinine 3.54, stable from 3.57 on 6/6 -Daily BMP -torsemide to 20 mg daily  Gastric adenocarcinoma Per oncology note on 6/2, stage I, T2N0M0adenocarcinoma of the stomach. Patient only candidate for palliative radiation therapy. -Continue to follow outpatient with oncology  Heart failure with preserved ejection fraction, chronic Euvolemic on exam this morning. VSS, BP 149/58 this AM.  - torsemide 20mg  daily - Coreg 12.5 bid  - Imdur 120mg  daily   Hypertension, chronic BP 149/58. Home meds norvasc 10mg  (reports 5mg ), carvedilol 12.5mg  bid, clonidine 0.2 mg, hydral 100mg  q8h -Clonidine 0.2mg   -Amlodipine 5 mg daily -hydralazine 100mg  q8h -carvedilol 12.5mg  bid  Chronic  anemia -Continue iron supplements   CAD s/p CABG Her medication includes aspirin 81 mg daily and atorvastatin. -Continue atorvastatin -Hold aspirin in the setting of GI bleed  Hypothyroidism -Synthroid 100 mg daily   COPD -Continue glycopyrrolate -Duo nebs PRN  BPH -doxazosin   FEN/GI:regular diet  Prophylaxis:SCD (holding anticoagulation due to GI bleed)  Disposition: discharge to SNF  Subjective:  Patient states he feels well. Denies CP or SOB. States he feels good to go home. Is frustrated he has to go to radiation treatment daily but understands it is important. Stressed importance of compliance.   Objective: Temp:  [98.3 F (36.8 C)-98.5 F (36.9 C)] 98.3 F (36.8 C) (06/07 0531) Pulse Rate:  [65-70] 65 (06/07 0531) Resp:  [18-19] 19 (06/07 0531) BP: (143-153)/(58-61) 149/58 (06/07 0531) SpO2:  [97 %-100 %] 98 % (06/07 0531) Weight:  [78.8 kg] 78.8 kg (06/06 2203) Physical Exam: General: awake and alert, laying in bed, NAD Cardiovascular: RRR, no MRG  Respiratory: CTAB, no wheezes, rales or rhonchi  Abdomen: soft, non tender, non distended, bowel sounds present  Extremities: no edema in RLE, left BKA, non tender   Laboratory: Recent Labs  Lab 07/09/18 0159 07/09/18 1511 07/10/18 0230 07/11/18 0454  WBC 6.4  --  5.6 5.0  HGB 7.3* 8.1* 7.7* 8.1*  HCT 21.7* 23.9* 23.0* 24.4*  PLT 142*  --  140* 161   Recent Labs  Lab 07/06/18 1517  07/09/18 0159 07/10/18 0230 07/11/18 0454  NA 139   < > 140 138 139  K 4.1   < > 3.8 3.9 4.1  CL 108   < > 109 107 106  CO2 22   < > 20*  21* 21*  BUN 86*   < > 65* 67* 69*  CREATININE 3.53*   < > 3.29* 3.57* 3.54*  CALCIUM 8.4*   < > 8.2* 8.2* 8.5*  PROT 5.2*  --   --   --   --   BILITOT 0.3  --   --   --   --   ALKPHOS 81  --   --   --   --   ALT 9  --   --   --   --   AST 9*  --   --   --   --   GLUCOSE 247*   < > 217* 192* 222*   < > = values in this interval not displayed.     Imaging/Diagnostic  Tests: Nm Myocar Multi W/spect W/wall Motion / Ef  Addendum Date: 06/25/2018    There was no ST segment deviation noted during stress.  Nuclear stress EF: 55%. The left ventricular ejection fraction is normal (55-65%).  This is a low risk study. The stress images are somewhat difficult to assess due to relatively low counts compared to the resting images . There appears to be diffuse attenuation of the entire LV but particularly in the lateral wal  The lateral wall contracts normally and his EF is normal . The patient is asymptomatic.  I think the patient is at low risk for his upcoming gastric surgery    Result Date: 06/25/2018  There was no ST segment deviation noted during stress.  Nuclear stress EF: 55%. The left ventricular ejection fraction is normal (55-65%).  This is a low risk study. There is no evidence of ischemia and no evidence of previous infarction  The study is normal.    Dg Chest Portable 1 View  Result Date: 06/23/2018 CLINICAL DATA:  Weakness vomiting EXAM: PORTABLE CHEST 1 VIEW COMPARISON:  03/28/2018, PET-CT 04/21/2018 FINDINGS: Possible hazy infiltrate at the left base. Mild cardiomegaly. No pleural effusion or pneumothorax. IMPRESSION: Cardiomegaly.  Possible mild hazy infiltrate at the left base. Electronically Signed   By: Donavan Foil M.D.   On: 06/23/2018 21:27     Caroline More, DO 07/11/2018, 9:44 AM PGY-2, [place] Intern pager: [phone], text pages welcome

## 2018-07-11 NOTE — Discharge Instructions (Signed)
Please do every other day hemoglobin checks

## 2018-07-11 NOTE — TOC Initial Note (Signed)
Transition of Care [facility]) - Initial/Assessment Note    Patient Details  Name: Leonard [name] MRN: [id number] Date of Birth: April 23, 1950  [facility]) CM/SW Contact:    Gelene Mink, [place] Phone Number: 07/11/2018, 2:01 PM  Clinical Narrative:                CSW met with the patient at bedside. He is agreeable to returning to [facility]. No further questions or concerns.      Expected Discharge Plan: Long Term Nursing Home([facility]) Barriers to Discharge: No Barriers Identified   Patient Goals and CMS Choice Patient states their goals for this hospitalization and ongoing recovery are:: Pt will go back to [facility]   Choice offered to / list presented to : NA  Expected Discharge Plan and Services Expected Discharge Plan: Long Term Nursing Home([facility]) In-house Referral: Clinical Social Work Discharge Planning Services: CM Consult Post Acute Care Choice: Nursing Home Living arrangements for the past 2 months: [place] Expected Discharge Date: 07/11/18               DME Arranged: N/A DME Agency: NA       HH Arranged: NA HH Agency: NA        Prior Living Arrangements/Services Living arrangements for the past 2 months: [place] Lives with:: Facility Resident Patient language and need for interpreter reviewed:: Yes Do you feel safe going back to the place where you live?: Yes      Need for Family Participation in Patient Care: Yes (Comment) Care giver support system in place?: Yes (comment)   Criminal Activity/Legal Involvement Pertinent to Current Situation/Hospitalization: No - Comment as needed  Activities of Daily Living Home Assistive Devices/Equipment: Wheelchair, Environmental consultant (specify type) ADL Screening (condition at time of admission) Patient's cognitive ability adequate to safely complete daily activities?: Yes Is the patient deaf or have difficulty hearing?: No Does the patient have  difficulty seeing, even when wearing glasses/contacts?: No Does the patient have difficulty concentrating, remembering, or making decisions?: No Patient able to express need for assistance with ADLs?: Yes Does the patient have difficulty dressing or bathing?: Yes Independently performs ADLs?: Yes (appropriate for developmental age) Does the patient have difficulty walking or climbing stairs?: Yes Weakness of Legs: Both Weakness of Arms/Hands: None  Permission Sought/Granted                  Emotional Assessment              Admission diagnosis:  Gastrointestinal hemorrhage associated with acute gastritis [K29.01] Anemia, unspecified type [D64.9] Patient Active Problem List   Diagnosis Date Noted  . Chronic blood loss anemia   . GI bleed 06/23/2018  . Acute renal failure superimposed on chronic kidney disease ([place])   . Abdominal pain   . DNR (do not resuscitate)   . 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[place])   . DNR (do not resuscitate) discussion   . Anemia 03/29/2018  . Acute GI bleeding 03/28/2018  . Dysphagia 10/01/2017  . Palliative care by specialist   . Severe comorbid illness   . Moderate malnutrition ([place]) 08/27/2017  . Hypoxemia   . Atelectasis   . Chronic respiratory failure with hypoxia ([place]) 08/25/2017  . Pulmonary artery hypertension ([place]) 07/25/2017  . Pressure injury of skin 07/20/2017  . Goals of care, counseling/discussion 02/11/2017  . Diabetic polyneuropathy associated with type 2 diabetes mellitus ([place]) 01/14/2017  . Pleural effusion   . Chronic  anemia 02/28/2016  . Weakness generalized 01/21/2016  . Diabetic retinopathy ([place]) 11/13/2015  . Iron deficiency anemia 08/03/2015  . Type II diabetes mellitus with complication ([place])   . Chronic kidney disease (CKD), stage III (moderate) (HCC)   . MGUS (monoclonal gammopathy of unknown significance) 01/15/2015  . Deficiency anemia 01/15/2015  . CKD (chronic kidney disease) stage 4, GFR 15-29  ml/min (HCC) 01/15/2015  . Hypothyroidism 11/22/2014  . COPD GOLD III  02/16/2014  . Anemia of renal disease   . Chronic diastolic CHF (congestive heart failure) ([place])   . History of tobacco use 08/23/2013  . 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 CAD (coronary artery disease) 04/06/2013  . PVD - hx of Rt SFA PTA and s/p Rt 4-5th toe amp Dec 2014 04/05/2013  . 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 Mixed hyperlipidemia 07/17/2008   PCP:  Kathrene Alu, MD Pharmacy:  No Pharmacies Listed    Social Determinants of Health (SDOH) Interventions    Readmission Risk Interventions No flowsheet data found.

## 2018-07-11 NOTE — TOC Transition Note (Signed)
Transition of Care [facility]) - CM/SW Discharge Note   Patient Details  Name: Leonard [name] MRN: [id number] Date of Birth: 08-Feb-1950  [facility]) CM/SW Contact:  Gelene Mink, [place] Phone Number: 07/11/2018, 2:08 PM   Clinical Narrative:     Patient will DC to: Anticipated DC date: Family notified: Transport by:   Per MD patient ready for DC to . RN, patient, patient's family, and facility notified of DC. Discharge Summary and FL2 sent to facility. RN to call report prior to discharge ([phone]). The patient will go to room 107. DC packet on chart. Ambulance transport requested for patient.   CSW will sign off for now as social work intervention is no longer needed. Please consult [place] again if new needs arise.  Alyzabeth Pontillo, LCSW-A [place]/Clinical Social Work Department Cell: [phone]     Final next level of care: [place] Barriers to Discharge: No Barriers Identified   Patient Goals and CMS Choice Patient states their goals for this hospitalization and ongoing recovery are:: Pt will go back to [facility]   Choice offered to / list presented to : NA  Discharge Placement   Existing PASRR number confirmed : 07/10/18          Patient chooses bed at: Other - please specify in the comment section below:([facility]) Patient to be transferred to facility by: [place] Name of family member notified: Elwin Sleight Patient and family notified of of transfer: 07/11/18  Discharge Plan and Services In-house Referral: Clinical Social Work Discharge Planning Services: CM Consult Post Acute Care Choice: Nursing Home          DME Arranged: N/A DME Agency: NA       HH Arranged: NA HH Agency: NA        Social Determinants of Health (SDOH) Interventions     Readmission Risk Interventions No flowsheet data found.

## 2018-07-11 NOTE — Progress Notes (Signed)
Leonard [name] to be discharged [facility] per MD order. Discussed prescriptions and follow up appointments with the patient. Prescriptions given to patient; medication list explained in detail. Patient verbalized understanding.  Skin clean, dry and intact without evidence of skin break down, no evidence of skin tears noted. IV catheter discontinued intact. Site without signs and symptoms of complications. Dressing and pressure applied. Pt denies pain at the site currently. No complaints noted.  Patient free of lines, drains, and wounds.   An After Visit Summary (AVS) was printed and given to the patient. Patient escorted via wheelchair, and discharged home via private auto.  Baldo Ash, RN

## 2018-07-12 ENCOUNTER — Telehealth: Payer: Self-pay | Admitting: Radiation Oncology

## 2018-07-12 ENCOUNTER — Other Ambulatory Visit: Payer: Self-pay

## 2018-07-12 ENCOUNTER — Ambulatory Visit
Admission: RE | Admit: 2018-07-12 | Discharge: 2018-07-12 | Disposition: A | Discharge status: Home / Self Care | Payer: Medicare Other | Source: Ambulatory Visit | Attending: Radiation Oncology | Admitting: Radiation Oncology

## 2018-07-12 DIAGNOSIS — K920 Hematemesis: Secondary | ICD-10-CM | POA: Diagnosis not present

## 2018-07-12 DIAGNOSIS — Z1159 Encounter for screening for other viral diseases: Secondary | ICD-10-CM | POA: Diagnosis not present

## 2018-07-12 DIAGNOSIS — D500 Iron deficiency anemia secondary to blood loss (chronic): Secondary | ICD-10-CM | POA: Diagnosis not present

## 2018-07-12 DIAGNOSIS — Z03818 Encounter for observation for suspected exposure to other biological agents ruled out: Secondary | ICD-10-CM | POA: Diagnosis not present

## 2018-07-12 DIAGNOSIS — D62 Acute posthemorrhagic anemia: Secondary | ICD-10-CM | POA: Diagnosis not present

## 2018-07-12 DIAGNOSIS — I130 Hypertensive heart and chronic kidney disease with heart failure and stage 1 through stage 4 chronic kidney disease, or unspecified chronic kidney disease: Secondary | ICD-10-CM | POA: Diagnosis not present

## 2018-07-12 DIAGNOSIS — N184 Chronic kidney disease, stage 4 (severe): Secondary | ICD-10-CM | POA: Diagnosis not present

## 2018-07-12 DIAGNOSIS — C801 Malignant (primary) neoplasm, unspecified: Secondary | ICD-10-CM | POA: Diagnosis not present

## 2018-07-12 DIAGNOSIS — D649 Anemia, unspecified: Secondary | ICD-10-CM | POA: Diagnosis not present

## 2018-07-12 DIAGNOSIS — K922 Gastrointestinal hemorrhage, unspecified: Secondary | ICD-10-CM | POA: Diagnosis not present

## 2018-07-12 DIAGNOSIS — C163 Malignant neoplasm of pyloric antrum: Secondary | ICD-10-CM | POA: Diagnosis not present

## 2018-07-12 DIAGNOSIS — D002 Carcinoma in situ of stomach: Secondary | ICD-10-CM | POA: Diagnosis not present

## 2018-07-12 NOTE — Telephone Encounter (Signed)
Received call from L1 explaining patient was discharged from hospital to unknown facility. Upon further investigation found patient was discharged to [facility] on [address]. Phone facility, spoke with [place], and she confirms transportation has been arranged for patient to receive radiation therapy. Informed L1 of this finding.

## 2018-07-13 ENCOUNTER — Ambulatory Visit
Admission: RE | Admit: 2018-07-13 | Discharge: 2018-07-13 | Disposition: A | Discharge status: Home / Self Care | Payer: Medicare Other | Source: Ambulatory Visit | Attending: Radiation Oncology | Admitting: Radiation Oncology

## 2018-07-13 ENCOUNTER — Other Ambulatory Visit: Payer: Self-pay

## 2018-07-13 DIAGNOSIS — C163 Malignant neoplasm of pyloric antrum: Secondary | ICD-10-CM | POA: Diagnosis not present

## 2018-07-14 ENCOUNTER — Other Ambulatory Visit: Payer: Self-pay

## 2018-07-14 ENCOUNTER — Ambulatory Visit
Admission: RE | Admit: 2018-07-14 | Discharge: 2018-07-14 | Disposition: A | Discharge status: Home / Self Care | Payer: Medicare Other | Source: Ambulatory Visit | Attending: Radiation Oncology | Admitting: Radiation Oncology

## 2018-07-14 DIAGNOSIS — C163 Malignant neoplasm of pyloric antrum: Secondary | ICD-10-CM | POA: Diagnosis not present

## 2018-07-15 ENCOUNTER — Ambulatory Visit
Admission: RE | Admit: 2018-07-15 | Discharge: 2018-07-15 | Disposition: A | Discharge status: Home / Self Care | Payer: Medicare Other | Source: Ambulatory Visit | Attending: Radiation Oncology | Admitting: Radiation Oncology

## 2018-07-15 ENCOUNTER — Inpatient Hospital Stay (HOSPITAL_COMMUNITY)
Admission: EM | Admit: 2018-07-15 | Discharge: 2018-07-16 | DRG: 812 | Disposition: A | Discharge status: Skilled Nursing Facility | Payer: Medicare Other | Source: Skilled Nursing Facility | Attending: Family Medicine | Admitting: Family Medicine

## 2018-07-15 ENCOUNTER — Telehealth: Payer: Self-pay | Admitting: *Deleted

## 2018-07-15 ENCOUNTER — Other Ambulatory Visit: Payer: Self-pay

## 2018-07-15 ENCOUNTER — Encounter (HOSPITAL_COMMUNITY): Payer: Self-pay

## 2018-07-15 ENCOUNTER — Telehealth: Payer: Self-pay | Admitting: Radiation Oncology

## 2018-07-15 ENCOUNTER — Other Ambulatory Visit: Payer: Self-pay | Admitting: Radiation Oncology

## 2018-07-15 DIAGNOSIS — C163 Malignant neoplasm of pyloric antrum: Secondary | ICD-10-CM | POA: Diagnosis present

## 2018-07-15 DIAGNOSIS — E1122 Type 2 diabetes mellitus with diabetic chronic kidney disease: Secondary | ICD-10-CM | POA: Diagnosis present

## 2018-07-15 DIAGNOSIS — K920 Hematemesis: Secondary | ICD-10-CM | POA: Diagnosis present

## 2018-07-15 DIAGNOSIS — E1142 Type 2 diabetes mellitus with diabetic polyneuropathy: Secondary | ICD-10-CM | POA: Diagnosis present

## 2018-07-15 DIAGNOSIS — M2550 Pain in unspecified joint: Secondary | ICD-10-CM | POA: Diagnosis not present

## 2018-07-15 DIAGNOSIS — Z7401 Bed confinement status: Secondary | ICD-10-CM | POA: Diagnosis not present

## 2018-07-15 DIAGNOSIS — Z951 Presence of aortocoronary bypass graft: Secondary | ICD-10-CM | POA: Diagnosis not present

## 2018-07-15 DIAGNOSIS — Z87891 Personal history of nicotine dependence: Secondary | ICD-10-CM | POA: Diagnosis not present

## 2018-07-15 DIAGNOSIS — J449 Chronic obstructive pulmonary disease, unspecified: Secondary | ICD-10-CM | POA: Diagnosis present

## 2018-07-15 DIAGNOSIS — I5032 Chronic diastolic (congestive) heart failure: Secondary | ICD-10-CM | POA: Diagnosis present

## 2018-07-15 DIAGNOSIS — I130 Hypertensive heart and chronic kidney disease with heart failure and stage 1 through stage 4 chronic kidney disease, or unspecified chronic kidney disease: Secondary | ICD-10-CM | POA: Diagnosis present

## 2018-07-15 DIAGNOSIS — R1111 Vomiting without nausea: Secondary | ICD-10-CM | POA: Diagnosis not present

## 2018-07-15 DIAGNOSIS — E038 Other specified hypothyroidism: Secondary | ICD-10-CM | POA: Diagnosis not present

## 2018-07-15 DIAGNOSIS — N184 Chronic kidney disease, stage 4 (severe): Secondary | ICD-10-CM | POA: Diagnosis not present

## 2018-07-15 DIAGNOSIS — D649 Anemia, unspecified: Secondary | ICD-10-CM | POA: Diagnosis not present

## 2018-07-15 DIAGNOSIS — Z1159 Encounter for screening for other viral diseases: Secondary | ICD-10-CM

## 2018-07-15 DIAGNOSIS — Z8249 Family history of ischemic heart disease and other diseases of the circulatory system: Secondary | ICD-10-CM

## 2018-07-15 DIAGNOSIS — E118 Type 2 diabetes mellitus with unspecified complications: Secondary | ICD-10-CM | POA: Diagnosis present

## 2018-07-15 DIAGNOSIS — E1165 Type 2 diabetes mellitus with hyperglycemia: Secondary | ICD-10-CM | POA: Diagnosis not present

## 2018-07-15 DIAGNOSIS — C801 Malignant (primary) neoplasm, unspecified: Secondary | ICD-10-CM | POA: Diagnosis not present

## 2018-07-15 DIAGNOSIS — E039 Hypothyroidism, unspecified: Secondary | ICD-10-CM | POA: Diagnosis present

## 2018-07-15 DIAGNOSIS — K922 Gastrointestinal hemorrhage, unspecified: Secondary | ICD-10-CM | POA: Diagnosis not present

## 2018-07-15 DIAGNOSIS — R5381 Other malaise: Secondary | ICD-10-CM | POA: Diagnosis not present

## 2018-07-15 DIAGNOSIS — Z03818 Encounter for observation for suspected exposure to other biological agents ruled out: Secondary | ICD-10-CM | POA: Diagnosis not present

## 2018-07-15 DIAGNOSIS — D472 Monoclonal gammopathy: Secondary | ICD-10-CM | POA: Diagnosis present

## 2018-07-15 DIAGNOSIS — D62 Acute posthemorrhagic anemia: Secondary | ICD-10-CM | POA: Diagnosis not present

## 2018-07-15 DIAGNOSIS — R110 Nausea: Secondary | ICD-10-CM | POA: Diagnosis not present

## 2018-07-15 DIAGNOSIS — N400 Enlarged prostate without lower urinary tract symptoms: Secondary | ICD-10-CM | POA: Diagnosis present

## 2018-07-15 DIAGNOSIS — I2510 Atherosclerotic heart disease of native coronary artery without angina pectoris: Secondary | ICD-10-CM | POA: Diagnosis not present

## 2018-07-15 DIAGNOSIS — I10 Essential (primary) hypertension: Secondary | ICD-10-CM | POA: Diagnosis not present

## 2018-07-15 LAB — PREPARE RBC (CROSSMATCH)

## 2018-07-15 LAB — GLUCOSE, CAPILLARY
Glucose-Capillary: 187 mg/dL — ABNORMAL HIGH (ref 70–99)
Glucose-Capillary: 167 mg/dL — ABNORMAL HIGH (ref 70–99)

## 2018-07-15 LAB — COMPREHENSIVE METABOLIC PANEL
ALT: 8 U/L (ref 0–44)
AST: 9 U/L — ABNORMAL LOW (ref 15–41)
Albumin: 2.9 g/dL — ABNORMAL LOW (ref 3.5–5.0)
Alkaline Phosphatase: 72 U/L (ref 38–126)
Anion gap: 11 (ref 5–15)
BUN: 111 mg/dL — ABNORMAL HIGH (ref 8–23)
CO2: 21 mmol/L — ABNORMAL LOW (ref 22–32)
Calcium: 8.1 mg/dL — ABNORMAL LOW (ref 8.9–10.3)
Chloride: 107 mmol/L (ref 98–111)
Creatinine, Ser: 3.26 mg/dL — ABNORMAL HIGH (ref 0.61–1.24)
GFR calc Af Amer: 22 mL/min — ABNORMAL LOW (ref >60)
GFR calc non Af Amer: 19 mL/min — ABNORMAL LOW (ref >60)
Glucose, Bld: 272 mg/dL — ABNORMAL HIGH (ref 70–99)
Potassium: 5.1 mmol/L (ref 3.5–5.1)
Sodium: 139 mmol/L (ref 135–145)
Total Bilirubin: 0.4 mg/dL (ref 0.3–1.2)
Total Protein: 5.8 g/dL — ABNORMAL LOW (ref 6.5–8.1)

## 2018-07-15 LAB — CBC WITH DIFFERENTIAL/PLATELET
Abs Immature Granulocytes: 0.1 10*3/uL — ABNORMAL HIGH (ref 0.00–0.07)
Basophils Absolute: 0 10*3/uL (ref 0.0–0.1)
Basophils Relative: 0 %
Eosinophils Absolute: 0.3 10*3/uL (ref 0.0–0.5)
Eosinophils Relative: 4 %
HCT: 18.1 % — ABNORMAL LOW (ref 39.0–52.0)
Hemoglobin: 5.7 g/dL — CL (ref 13.0–17.0)
Immature Granulocytes: 1 %
Lymphocytes Relative: 6 %
Lymphs Abs: 0.5 10*3/uL — ABNORMAL LOW (ref 0.7–4.0)
MCH: 29.4 pg (ref 26.0–34.0)
MCHC: 31.5 g/dL (ref 30.0–36.0)
MCV: 93.3 fL (ref 80.0–100.0)
Monocytes Absolute: 0.5 10*3/uL (ref 0.1–1.0)
Monocytes Relative: 6 %
Neutro Abs: 5.9 10*3/uL (ref 1.7–7.7)
Neutrophils Relative %: 83 %
Platelets: 193 10*3/uL (ref 150–400)
RBC: 1.94 MIL/uL — ABNORMAL LOW (ref 4.22–5.81)
RDW: 14.1 % (ref 11.5–15.5)
WBC: 7.2 10*3/uL (ref 4.0–10.5)
nRBC: 0 % (ref 0.0–0.2)

## 2018-07-15 LAB — LIPASE, BLOOD: Lipase: 45 U/L (ref 11–51)

## 2018-07-15 MED ORDER — ACETAMINOPHEN 325 MG PO TABS
650.0000 mg | ORAL_TABLET | Freq: Once | ORAL | Status: DC
  Filled 2018-07-15: qty 2

## 2018-07-15 MED ORDER — NITROGLYCERIN 0.4 MG SL SUBL: 0.4000 mg | SUBLINGUAL_TABLET | SUBLINGUAL | Status: DC | PRN

## 2018-07-15 MED ORDER — DOXAZOSIN MESYLATE 8 MG PO TABS
8.0000 mg | ORAL_TABLET | Freq: Every evening | ORAL | Status: DC
  Filled 2018-07-15: qty 1

## 2018-07-15 MED ORDER — SODIUM CHLORIDE 0.9 % IV SOLN: INTRAVENOUS | Status: DC

## 2018-07-15 MED ORDER — ACETAMINOPHEN 325 MG PO TABS: 650.0000 mg | ORAL_TABLET | Freq: Four times a day (QID) | ORAL | Status: DC | PRN

## 2018-07-15 MED ORDER — CLONIDINE HCL 0.2 MG/24HR TD PTWK: 0.2000 mg | MEDICATED_PATCH | TRANSDERMAL | Status: DC

## 2018-07-15 MED ORDER — ONDANSETRON HCL 4 MG PO TABS: 4.0000 mg | ORAL_TABLET | Freq: Four times a day (QID) | ORAL | Status: DC | PRN

## 2018-07-15 MED ORDER — INSULIN ASPART 100 UNIT/ML ~~LOC~~ SOLN
0.0000 [IU] | SUBCUTANEOUS | Status: DC
  Administered 2018-07-15 (×2): 3 [IU] via SUBCUTANEOUS
  Administered 2018-07-16: 2 [IU] via SUBCUTANEOUS
  Administered 2018-07-16: 5 [IU] via SUBCUTANEOUS
  Administered 2018-07-16: 2 [IU] via SUBCUTANEOUS

## 2018-07-15 MED ORDER — CARVEDILOL 12.5 MG PO TABS
12.5000 mg | ORAL_TABLET | Freq: Two times a day (BID) | ORAL | Status: DC
  Administered 2018-07-16: 08:00:00 12.5 mg via ORAL
  Filled 2018-07-15: qty 1

## 2018-07-15 MED ORDER — ATORVASTATIN CALCIUM 40 MG PO TABS: 40.0000 mg | ORAL_TABLET | Freq: Every day | ORAL | Status: DC

## 2018-07-15 MED ORDER — TORSEMIDE 20 MG PO TABS
40.0000 mg | ORAL_TABLET | Freq: Every day | ORAL | Status: DC
  Administered 2018-07-16: 40 mg via ORAL
  Filled 2018-07-15: qty 2

## 2018-07-15 MED ORDER — LEVOTHYROXINE SODIUM 100 MCG PO TABS: 100.0000 ug | ORAL_TABLET | Freq: Every day | ORAL | Status: DC

## 2018-07-15 MED ORDER — ISOSORBIDE MONONITRATE ER 60 MG PO TB24
120.0000 mg | ORAL_TABLET | Freq: Every day | ORAL | Status: DC
  Administered 2018-07-16: 120 mg via ORAL
  Filled 2018-07-15: qty 2

## 2018-07-15 MED ORDER — FERROUS SULFATE 325 (65 FE) MG PO TABS: 325.0000 mg | ORAL_TABLET | Freq: Every day | ORAL | Status: DC

## 2018-07-15 MED ORDER — HYDRALAZINE HCL 50 MG PO TABS
100.0000 mg | ORAL_TABLET | Freq: Three times a day (TID) | ORAL | Status: DC
  Filled 2018-07-15: qty 2

## 2018-07-15 MED ORDER — SODIUM CHLORIDE 0.9% FLUSH: 3.0000 mL | INTRAVENOUS | Status: DC | PRN

## 2018-07-15 MED ORDER — AMLODIPINE BESYLATE 5 MG PO TABS
5.0000 mg | ORAL_TABLET | Freq: Every day | ORAL | Status: DC
  Administered 2018-07-16: 5 mg via ORAL
  Filled 2018-07-15: qty 1

## 2018-07-15 MED ORDER — ACETAMINOPHEN 650 MG RE SUPP: 650.0000 mg | Freq: Four times a day (QID) | RECTAL | Status: DC | PRN

## 2018-07-15 MED ORDER — SODIUM CHLORIDE 0.9% FLUSH
3.0000 mL | Freq: Two times a day (BID) | INTRAVENOUS | Status: DC
  Administered 2018-07-15: 3 mL via INTRAVENOUS

## 2018-07-15 MED ORDER — HYDRALAZINE HCL 20 MG/ML IJ SOLN: 10.0000 mg | Freq: Four times a day (QID) | INTRAMUSCULAR | Status: DC | PRN

## 2018-07-15 MED ORDER — PANTOPRAZOLE SODIUM 40 MG IV SOLR
40.0000 mg | Freq: Two times a day (BID) | INTRAVENOUS | Status: DC
  Administered 2018-07-15 – 2018-07-16 (×2): 40 mg via INTRAVENOUS
  Filled 2018-07-15 (×2): qty 40

## 2018-07-15 MED ORDER — SODIUM CHLORIDE 0.9% IV SOLUTION
250.0000 mL | Freq: Once | INTRAVENOUS | Status: DC
  Filled 2018-07-15: qty 250

## 2018-07-15 MED ORDER — SODIUM CHLORIDE 0.9% IV SOLUTION: Freq: Once | INTRAVENOUS | Status: DC

## 2018-07-15 MED ORDER — ONDANSETRON HCL 4 MG/2ML IJ SOLN: 4.0000 mg | Freq: Four times a day (QID) | INTRAMUSCULAR | Status: DC | PRN

## 2018-07-15 MED ORDER — DIPHENHYDRAMINE HCL 50 MG/ML IJ SOLN
25.0000 mg | Freq: Once | INTRAMUSCULAR | Status: DC
  Filled 2018-07-15: qty 1

## 2018-07-15 NOTE — ED Notes (Signed)
Per [facility]-coming from [facility]-states has an abdominal tumor-states it's bleeding-states is currently undergoing radiation to stop bleed, currently Hgb 6.7-unable to get into clinic for a transfusion-had 3 units recently and bumped Hgb 8.1

## 2018-07-15 NOTE — Telephone Encounter (Signed)
LM for Rosalyn to let her know that her uncle's hgb was 6.7 today (8.1 Sunday prior to dc following 3 units). We were trying to coordinate outpt transfusion, but on his way back to [facility] from his radiation treatment to the stomach, had large episode of coffee ground emesis.  He cannot go outpt today to any of the infusion sites through the [facility]. Rather, he will go to [facility] ED with expected admission to manage his acute on chronic blood loss anemia from his stage I gastric cancer. GI declined EGD last hospitalization last week because we know the source of his bleeding is from his tumor. Hopefully with radiation, in the next week his counts will stabilize. He does have a baseline history of CKD Stage III. The suspicion is also that his regular diet is contributing to the friability of his situation, and per [name] should be on a soft mechanical diet until his hgb stablizes. He is not a surgical candidate currently.      Carola Rhine, PAC

## 2018-07-15 NOTE — Telephone Encounter (Signed)
Spoke with T at [facility] regarding having Mr. Leonard [name] here at the cancer center for labs at [facility] followed by blood transfusion tomorrow 07/16/2018.  She informed that his hemoglobin was 6.7 today.  She verbalized understanding of this.  Will continue to follow as necessary.  Gloriajean Dell. Leonie Green, BSN

## 2018-07-15 NOTE — ED Notes (Addendum)
CRITICAL VALUE STICKER  CRITICAL VALUE: Hgb 5.7  RECEIVER (on-site recipient of call): Ary Rudnick, [place] NOTIFIED: 6/11 1619  MD NOTIFIED: Nuala Alpha, PA  TIME OF NOTIFICATION: 6/11 1619

## 2018-07-15 NOTE — ED Notes (Signed)
ED TO INPATIENT HANDOFF REPORT  ED Nurse Name and Phone #:   S Name/Age/Gender Leonard [name] 68 y.o. male Room/Bed: WA02/WA02  Code Status   Code Status: Prior  Home/SNF/Other Home Patient oriented to: self, place, time and situation Is this baseline? Yes   Triage Complete: Triage complete  Chief Complaint low hgB  Triage Note Pt BIB EMS from nursing facility. Pt had scheduled chemo tx today and had x1 episode of coffee ground emesis on way home.  Pt diagnosed with stomach CA. Pt facility reports low hgb.   Pt gives pain 0/10. Pt denies nausea.    Allergies Allergies  Allergen Reactions  . Penicillins Other (See Comments)    Has patient had a PCN reaction causing immediate rash, facial/tongue/throat swelling, SOB or lightheadedness with hypotension: Unknown Has patient had a PCN reaction causing severe rash involving mucus membranes or skin necrosis: Unknown Has patient had a PCN reaction that required hospitalization: Unknown Has patient had a PCN reaction occurring within the last 10 years: Unknown If all of the above answers are "NO", then may proceed with Cephalosporin use.     Level of Care/Admitting Diagnosis ED Disposition    [facility] [[id number]]  Level of Care: Med-Surg [16]  Covid Evaluation: N/A  Diagnosis: Hematemesis/vomiting blood [329518]  Admitting Physician: Darliss Cheney [[id number]]  Attending Physician: Darliss Cheney [id number]  Estimated length of stay: 3 - 4 days  Certification:: I certify this patient will need inpatient services for at least 2 midnights  PT Class (Do Not Modify): Inpatient [101]  PT Acc Code (Do Not Modify): Private [1]       B Medical/Surgery History Past Medical History:  Diagnosis Date  . Acute on chronic diastolic heart failure ([place]) 07/04/2014  . Anemia of renal disease   . Atherosclerotic peripheral vascular disease with gangrene ([place])  02/14/2017  . CAD (coronary artery disease) 04/06/2013  . Cancer ([place])    skin cancer-  . CHF (congestive heart failure) ([place])   . Chronic anemia 02/28/2016  . 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 Chronic respiratory failure with hypoxia ([place]) 08/25/2017  . CKD (chronic kidney disease) stage 4, GFR 15-29 ml/min (HCC) 01/15/2015   Follows with [facility].  Last visits:  07/23/16: No change in meds. Worried that if we increase diuretics could lead to another AKI  . CKD (chronic kidney disease), stage IV ([place])    07/07/2017  . Claudication ([place])   . Collapse of left talus 01/14/2017  . COPD GOLD III  02/16/2014   Quit smoking 2014  - PFT's  04/06/13   FEV1 1.28 (44 % ) ratio 48  p no % improvement from saba p ?  prior to study with DLCO  43/54 % corrects to 97  % for alv volume - 01/31/2016  After extensive coaching HFA effectiveness =    75% from baseline 0 > changed to sprivia respimat   - PFT's  03/17/2016  FEV1 2.25  (81 % ) ratio 88  p 2 % improvement from saba p spiriva respimat x 2  prior to study with  . Coronary artery disease   . Diabetes ([place])   . Diabetic polyneuropathy associated with type 2 diabetes mellitus ([place]) 01/14/2017  . Diabetic retinopathy ([place]) 11/13/2015   Per Dr. Erenest Rasher eye exam in 10/17/15, mild background diabetic retinopathy  . Fatigue associated with  anemia 01/21/2016  . Gangrene of toe ([place])    right 5th/notes 01/04/2013   . HCAP (healthcare-associated pneumonia)   . Hematuria 06/27/2015  . High cholesterol   . History of tobacco use 08/23/2013   Smoked pack per day for 46 years Quit smoking - 02/03/2013   . Hypertension   . Hypothyroidism 11/22/2014  . Idiopathic chronic gout of left foot without tophus   . Iron deficiency anemia 08/03/2015  . Left foot pain 10/29/2016  . Long term (current) use of anticoagulants [Z79.01] 09/09/2016  . MGUS (monoclonal gammopathy of unknown significance) 01/15/2015  . Mixed  hyperlipidemia 07/17/2008  . Morbid obesity due to excess calories ([place]) 03/17/2016  . PAD (peripheral artery disease) ([place])   . Pleural effusion   . Pneumonitis 07/26/2017  . Pulmonary artery hypertension ([place]) 07/25/2017  . PVD - hx of Rt SFA PTA and s/p Rt 4-5th toe amp Dec 2014 04/05/2013  . 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 Respiratory failure ([place]) 08/13/2017  . Respiratory failure with hypoxia ([place]) 09/23/2017  . 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 Secondary hypertension   . Shortness of breath dyspnea    with exertion  . Skin lesion of face 02/22/2015  . SOB (shortness of breath)   . Type 2 diabetes mellitus with pressure callus ([place]) 12/29/2016  . Type II diabetes mellitus (HCC)    Type II  . Type II diabetes mellitus with complication [facility])    Past Surgical History:  Procedure Laterality Date  . ABDOMINAL AORTOGRAM W/LOWER EXTREMITY N/A 03/12/2017   Procedure: ABDOMINAL AORTOGRAM W/LOWER EXTREMITY;  Surgeon: Conrad Springs, MD;  Location: [place] CV LAB;  Service: Cardiovascular;  Laterality: N/A;  . AMPUTATION Right 01/28/2013   Procedure: RAY AMPUTATION RIGHT  4th & 5th TOE;  Surgeon: Rosetta Posner, MD;  Location: [place];  Service: Vascular;  Laterality: Right;  . AMPUTATION Left 03/15/2017   Procedure: AMPUTATION ABOVE KNEE;  Surgeon: Waynetta Sandy, MD;  Location: [place];  Service: Vascular;  Laterality: Left;  . ANGIOPLASTY / STENTING FEMORAL Right 01/13/2013   superficial femoral artery x 2 (3 mm x 60 mm, 4 mm x 60 mm)  . AV FISTULA PLACEMENT Left 02/11/2016   Procedure: LEFT UPPER ARM BRACHIAL CEPHALIC ARTERIOVENOUS (AV) FISTULA CREATION;  Surgeon: Conrad Meadowlakes, MD;  Location: [place];  Service: Vascular;  Laterality: Left;  . BIOPSY  03/29/2018   Procedure: BIOPSY;  Surgeon: Rush Landmark Telford Nab., MD;  Location: [place];  Service: Gastroenterology;;  . BIOPSY  05/31/2018   Procedure: BIOPSY;  Surgeon: Irving Copas., MD;  Location: [place];  Service:  Gastroenterology;;  . COLONOSCOPY W/ POLYPECTOMY    . CORONARY ARTERY BYPASS GRAFT N/A 04/07/2013   Procedure: CORONARY ARTERY BYPASS GRAFTING (CABG);  Surgeon: Ivin Poot, MD;  Location: [place];  Service: Open Heart Surgery;  Laterality: N/A;  . ESOPHAGOGASTRODUODENOSCOPY (EGD) WITH PROPOFOL N/A 03/29/2018   Procedure: ESOPHAGOGASTRODUODENOSCOPY (EGD) WITH PROPOFOL;  Surgeon: Rush Landmark Telford Nab., MD;  Location: [place];  Service: Gastroenterology;  Laterality: N/A;  . ESOPHAGOGASTRODUODENOSCOPY (EGD) WITH PROPOFOL N/A 05/31/2018   Procedure: ESOPHAGOGASTRODUODENOSCOPY (EGD) WITH PROPOFOL;  Surgeon: Rush Landmark Telford Nab., MD;  Location: [place];  Service: Gastroenterology;  Laterality: N/A;  . EUS N/A 05/31/2018   Procedure: UPPER ENDOSCOPIC ULTRASOUND (EUS) RADIAL;  Surgeon: Rush Landmark Telford Nab., MD;  Location: [place];  Service: Gastroenterology;  Laterality: N/A;  . INTRAOPERATIVE TRANSESOPHAGEAL ECHOCARDIOGRAM N/A 04/07/2013   Procedure: INTRAOPERATIVE TRANSESOPHAGEAL ECHOCARDIOGRAM;  Surgeon: Ivin Poot, MD;  Location: [place];  Service: Open Heart Surgery;  Laterality: N/A;  . IR FLUORO GUIDE CV LINE RIGHT  11/04/2017  . IR REMOVAL TUN CV CATH W/O FL  11/27/2017  . IR US GUIDE VASC ACCESS RIGHT  11/04/2017  . LEFT HEART CATHETERIZATION WITH CORONARY ANGIOGRAM N/A 04/05/2013   Procedure: LEFT HEART CATHETERIZATION WITH CORONARY ANGIOGRAM;  Surgeon: Peter M Martinique, MD;  Location: [facility] CATH LAB;  Service: Cardiovascular;  Laterality: N/A;  . LOWER EXTREMITY ANGIOGRAM Bilateral 01/06/2013   Procedure: LOWER EXTREMITY ANGIOGRAM;  Surgeon: Conrad [place], MD;  Location: [facility] CATH LAB;  Service: Cardiovascular;  Laterality: Bilateral;  . LOWER EXTREMITY ANGIOGRAM Right 01/13/2013   Procedure: LOWER EXTREMITY ANGIOGRAM;  Surgeon: Conrad Marne, MD;  Location: [facility] CATH LAB;  Service: Cardiovascular;  Laterality: Right;  . SKIN CANCER EXCISION  2017  . THROAT SURGERY  1983   polyps  .  TONSILLECTOMY AND ADENOIDECTOMY  ~ 1965     A IV Location/Drains/Wounds Patient Lines/Drains/Airways Status   Active Line/Drains/Airways    Name:   Placement date:   Placement time:   Site:   Days:   Fistula / Graft Left Upper arm Arteriovenous vein graft   07/03/17    0000    Upper arm   377   Fistula / Graft Left Upper arm   -    -    Upper arm      Tunneled CVC Single Lumen (Radiology) 11/04/17 Right Internal jugular 24 cm   11/04/17    1009    Internal jugular   253          Intake/Output Last 24 hours No intake or output data in the 24 hours ending 07/15/18 1733  Labs/Imaging Results for orders placed or performed during the hospital encounter of 07/15/18 (from the past 48 hour(s))  Type and screen [place]     Status: None   Collection Time: 07/15/18  2:55 PM  Result Value Ref Range   ABO/RH(D) B NEG    Antibody Screen NEG    Sample Expiration      07/18/2018,2359 Performed at [facility], [place] [address], [place], [place] [address]   CBC with Differential     Status: Abnormal   Collection Time: 07/15/18  2:55 PM  Result Value Ref Range   WBC 7.2 4.0 - 10.5 K/uL   RBC 1.94 (L) 4.22 - 5.81 MIL/uL   Hemoglobin 5.7 (LL) 13.0 - 17.0 g/dL    Comment: This critical result has verified and been called to [facility], Veona Bittman RN by Karin Golden on 06 11 2020 at 1618, and has been read back. CRITICAL HGB CALLED TO Blakelyn Dinges, REBECCA RN   HCT 18.1 (L) 39.0 - 52.0 %   MCV 93.3 80.0 - 100.0 fL   MCH 29.4 26.0 - 34.0 pg   MCHC 31.5 30.0 - 36.0 g/dL   RDW 14.1 11.5 - 15.5 %   Platelets 193 150 - 400 K/uL   nRBC 0.0 0.0 - 0.2 %   Neutrophils Relative % 83 %   Neutro Abs 5.9 1.7 - 7.7 K/uL   Lymphocytes Relative 6 %   Lymphs Abs 0.5 (L) 0.7 - 4.0 K/uL   Monocytes Relative 6 %   Monocytes Absolute 0.5 0.1 - 1.0 K/uL   Eosinophils Relative 4 %   Eosinophils Absolute 0.3 0.0 - 0.5 K/uL   Basophils Relative 0 %   Basophils Absolute  0.0 0.0 -  0.1 K/uL   Immature Granulocytes 1 %   Abs Immature Granulocytes 0.10 (H) 0.00 - 0.07 K/uL    Comment: Performed at [facility], [place] [address], [place], [place] [address]  Comprehensive metabolic panel     Status: Abnormal   Collection Time: 07/15/18  2:55 PM  Result Value Ref Range   Sodium 139 135 - 145 mmol/L   Potassium 5.1 3.5 - 5.1 mmol/L   Chloride 107 98 - 111 mmol/L   CO2 21 (L) 22 - 32 mmol/L   Glucose, Bld 272 (H) 70 - 99 mg/dL   BUN 111 (H) 8 - 23 mg/dL    Comment: RESULTS CONFIRMED BY MANUAL DILUTION   Creatinine, Ser 3.26 (H) 0.61 - 1.24 mg/dL   Calcium 8.1 (L) 8.9 - 10.3 mg/dL   Total Protein 5.8 (L) 6.5 - 8.1 g/dL   Albumin 2.9 (L) 3.5 - 5.0 g/dL   AST 9 (L) 15 - 41 U/L   ALT 8 0 - 44 U/L   Alkaline Phosphatase 72 38 - 126 U/L   Total Bilirubin 0.4 0.3 - 1.2 mg/dL   GFR calc non Af Amer 19 (L) >60 mL/min   GFR calc Af Amer 22 (L) >60 mL/min   Anion gap 11 5 - 15    Comment: Performed at [facility], [place] [address], [place], [place] [address]  Lipase, blood     Status: None   Collection Time: 07/15/18  2:55 PM  Result Value Ref Range   Lipase 45 11 - 51 U/L    Comment: Performed at [facility], [place] [address] [place], [place] [address]  Prepare RBC     Status: None   Collection Time: 07/15/18  4:44 PM  Result Value Ref Range   Order Confirmation      ORDER PROCESSED BY BLOOD BANK Performed at [place] [address], [place], [place] [address]   Prepare RBC     Status: None   Collection Time: 07/15/18  5:30 PM  Result Value Ref Range   Order Confirmation      ORDER PROCESSED BY BLOOD BANK Performed at [place] [address] [place], [place] [address]    *Note: Due to a large number of results and/or encounters for the requested time period, some results have not been displayed. A complete set of results can be found in Results Review.   No  results found.  Pending Labs Unresulted Labs (From admission, onward)    Start     Ordered   07/15/18 1643  Novel Coronavirus,NAA,(SEND-OUT TO REF LAB - TAT 24-48 hrs); Hosp Order  (Asymptomatic Patients Labs)  Once,   STAT    Question:  Rule Out  Answer:  Yes   07/15/18 1644   Signed and Held  HIV antibody (Routine Testing)  Once,   R     Signed and Held   Signed and Held  Hemoglobin A1c  Once,   R    Comments: To assess prior glycemic control    Signed and Held   Signed and Held  Comprehensive metabolic panel  Tomorrow morning,   R     Signed and Held   Signed and Held  CBC  Tomorrow morning,   R     Signed and Held   Signed and Held  Hemoglobin  Now then every 8 hours,   R     Signed  and Held   Signed and Held  Hematocrit  Now then every 8 hours,   R     Signed and Held          Vitals/Pain Today's Vitals   07/15/18 1530 07/15/18 1600 07/15/18 1630 07/15/18 1700  BP: 138/60 (!) 151/62 (!) 145/58 (!) 153/62  Pulse: 75 73 76 76  Resp: 18 18 18 18   Temp:   97.9 F (36.6 C)   TempSrc:   Oral   SpO2: 100% 100% 100% 100%    Isolation Precautions No active isolations  Medications Medications  0.9 %  sodium chloride infusion (Manually program via Guardrails IV Fluids) (has no administration in time range)    Mobility walks with person assist Low fall risk   R Recommendations: See Admitting Provider Note  Report given to:   Additional Notes:

## 2018-07-15 NOTE — ED Triage Notes (Signed)
Pt BIB EMS from nursing facility. Pt had scheduled chemo tx today and had x1 episode of coffee ground emesis on way home.  Pt diagnosed with stomach CA. Pt facility reports low hgb.   Pt gives pain 0/10. Pt denies nausea.

## 2018-07-15 NOTE — ED Provider Notes (Addendum)
[facility] DEPT Provider Note   CSN: [id number] Arrival date & time: 07/15/18  1427    History   Chief Complaint Chief Complaint  Patient presents with   Abnormal Lab   Emesis    HPI Leonard [name] is a 68 y.o. male with history of gastric cancer currently undergoing palliative radiation therapy, CHF, CKD, COPD, diabetes, hypertension, hyperlipidemia, chronic anemia presents today via EMS for for ground emesis and anemia.  Patient reports 2 episodes of coffee-ground emesis after leaving the cancer center today.  He denies any pain associated with this emesis.  Additionally he was informed today that his hemoglobin was 6.7 and that he needed to be evaluated in the ER.  Patient reports that he is at baseline, reports some fatigue which is not new for him he has no other concerns or complaints.  He denies any history of fever/chills, chest pain/shortness of breath, abdominal pain, diarrhea, dysuria/hematuria or other concerns.  Chart review reveals that patient was admitted on 07/06/2022 anemia and gastrointestinal hemorrhage with gastritis he received 4 units of packed red blood cells.     HPI  Past Medical History:  Diagnosis Date   Acute on chronic diastolic heart failure ([place]) 07/04/2014   Anemia of renal disease    Atherosclerotic peripheral vascular disease with gangrene ([place]) 02/14/2017   CAD (coronary artery disease) 04/06/2013   Cancer (HCC)    skin cancer-   CHF (congestive heart failure) (HCC)    Chronic anemia 02/28/2016   Chronic deep vein thrombosis (DVT) of femoral vein of left lower extremity ([place]) 09/03/2016   Chronic respiratory failure with hypoxia ([place]) 08/25/2017   CKD (chronic kidney disease) stage 4, GFR 15-29 ml/min ([place]) 01/15/2015   Follows with [facility].  Last visits:  07/23/16: No change in meds. Worried that if we increase diuretics could lead to another AKI   CKD (chronic kidney disease), stage IV ([place])    07/07/2017   Claudication ([place])    Collapse of left talus 01/14/2017   COPD GOLD III  02/16/2014   Quit smoking 2014  - PFT's  04/06/13   FEV1 1.28 (44 % ) ratio 48  p no % improvement from saba p ?  prior to study with DLCO  43/54 % corrects to 97  % for alv volume - 01/31/2016  After extensive coaching HFA effectiveness =    75% from baseline 0 > changed to sprivia respimat   - PFT's  03/17/2016  FEV1 2.25  (81 % ) ratio 88  p 2 % improvement from saba p spiriva respimat x 2  prior to study with   Coronary artery disease    Diabetes ([place])    Diabetic polyneuropathy associated with type 2 diabetes mellitus ([place]) 01/14/2017   Diabetic retinopathy ([place]) 11/13/2015   Per Dr. Erenest Rasher eye exam in 10/17/15, mild background diabetic retinopathy   Fatigue associated with anemia 01/21/2016   Gangrene of toe ([place])    right 5th/notes 01/04/2013    HCAP (healthcare-associated pneumonia)    Hematuria 06/27/2015   High cholesterol    History of tobacco use 08/23/2013   Smoked pack per day for 46 years Quit smoking - 02/03/2013    Hypertension    Hypothyroidism 11/22/2014   Idiopathic chronic gout of left foot without tophus    Iron deficiency anemia 08/03/2015   Left foot pain 10/29/2016   Long term (current) use of anticoagulants [Z79.01] 09/09/2016   MGUS (monoclonal gammopathy of unknown significance) 01/15/2015  Mixed hyperlipidemia 07/17/2008   Morbid obesity due to excess calories ([place]) 03/17/2016   PAD (peripheral artery disease) (HCC)    Pleural effusion    Pneumonitis 07/26/2017   Pulmonary artery hypertension ([place]) 07/25/2017   PVD - hx of Rt SFA PTA and s/p Rt 4-5th toe amp Dec 2014 04/05/2013   Resistant hypertension 11/25/2012   Respiratory failure ([place]) 08/13/2017   Respiratory failure with hypoxia ([place]) 09/23/2017   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   Secondary hypertension    Shortness of breath dyspnea    with exertion   Skin lesion of face 02/22/2015   SOB (shortness of  breath)    Type 2 diabetes mellitus with pressure callus ([place]) 12/29/2016   Type II diabetes mellitus ([place])    Type II   Type II diabetes mellitus with complication [facility])     Patient Active Problem List   Diagnosis Date Noted   Hematemesis/vomiting blood 07/15/2018   Acute blood loss anemia 07/15/2018   Chronic blood loss anemia    Upper GI bleed 06/23/2018   Acute renal failure superimposed on chronic kidney disease (HCC)    Abdominal pain    DNR (do not resuscitate)    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[place])    DNR (do not resuscitate) discussion    Anemia 03/29/2018   Acute GI bleeding 03/28/2018   Dysphagia 10/01/2017   Palliative care by specialist    Severe comorbid illness    Moderate malnutrition ([place]) 08/27/2017   Hypoxemia    Atelectasis    Chronic respiratory failure with hypoxia ([place]) 08/25/2017   Pulmonary artery hypertension ([place]) 07/25/2017   Pressure injury of skin 07/20/2017   Goals of care, counseling/discussion 02/11/2017   Diabetic polyneuropathy associated with type 2 diabetes mellitus ([place]) 01/14/2017   Pleural effusion    Chronic anemia 02/28/2016   Weakness generalized 01/21/2016   Diabetic retinopathy ([place]) 11/13/2015   Iron deficiency anemia 08/03/2015   Type II diabetes mellitus with complication (HCC)    Chronic kidney disease (CKD), stage III (moderate) (HCC)    MGUS (monoclonal gammopathy of unknown significance) 01/15/2015   Deficiency anemia 01/15/2015   CKD (chronic kidney disease) stage 4, GFR 15-29 ml/min (HCC) 01/15/2015   Hypothyroidism 11/22/2014   COPD GOLD III  02/16/2014   Anemia of renal disease    Chronic diastolic CHF (congestive heart failure) ([place])    History of tobacco use 08/23/2013   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   CAD (coronary artery disease) 04/06/2013   PVD - hx of Rt SFA PTA and s/p Rt 4-5th toe amp Dec 2014 04/05/2013   Resistant hypertension 11/25/2012   Mixed  hyperlipidemia 07/17/2008    Past Surgical History:  Procedure Laterality Date   ABDOMINAL AORTOGRAM W/LOWER EXTREMITY N/A 03/12/2017   Procedure: ABDOMINAL AORTOGRAM W/LOWER EXTREMITY;  Surgeon: Conrad Markham, MD;  Location: [facility] CV LAB;  Service: Cardiovascular;  Laterality: N/A;   AMPUTATION Right 01/28/2013   Procedure: RAY AMPUTATION RIGHT  4th & 5th TOE;  Surgeon: Rosetta Posner, MD;  Location: [facility];  Service: Vascular;  Laterality: Right;   AMPUTATION Left 03/15/2017   Procedure: AMPUTATION ABOVE KNEE;  Surgeon: Waynetta Sandy, MD;  Location: [facility];  Service: Vascular;  Laterality: Left;   ANGIOPLASTY / STENTING FEMORAL Right 01/13/2013   superficial femoral artery x 2 (3 mm x 60 mm, 4 mm x 60 mm)   AV FISTULA PLACEMENT Left 02/11/2016   Procedure: LEFT UPPER ARM BRACHIAL  CEPHALIC ARTERIOVENOUS (AV) FISTULA CREATION;  Surgeon: Conrad Hatfield, MD;  Location: [facility];  Service: Vascular;  Laterality: Left;   BIOPSY  03/29/2018   Procedure: BIOPSY;  Surgeon: Rush Landmark Telford Nab., MD;  Location: [facility];  Service: Gastroenterology;;   BIOPSY  05/31/2018   Procedure: BIOPSY;  Surgeon: Irving Copas., MD;  Location: [facility];  Service: Gastroenterology;;   COLONOSCOPY W/ POLYPECTOMY     CORONARY ARTERY BYPASS GRAFT N/A 04/07/2013   Procedure: CORONARY ARTERY BYPASS GRAFTING (CABG);  Surgeon: Ivin Poot, MD;  Location: [facility];  Service: Open Heart Surgery;  Laterality: N/A;   ESOPHAGOGASTRODUODENOSCOPY (EGD) WITH PROPOFOL N/A 03/29/2018   Procedure: ESOPHAGOGASTRODUODENOSCOPY (EGD) WITH PROPOFOL;  Surgeon: Rush Landmark Telford Nab., MD;  Location: [facility];  Service: Gastroenterology;  Laterality: N/A;   ESOPHAGOGASTRODUODENOSCOPY (EGD) WITH PROPOFOL N/A 05/31/2018   Procedure: ESOPHAGOGASTRODUODENOSCOPY (EGD) WITH PROPOFOL;  Surgeon: Rush Landmark Telford Nab., MD;  Location: [facility];  Service: Gastroenterology;  Laterality: N/A;   EUS N/A  05/31/2018   Procedure: UPPER ENDOSCOPIC ULTRASOUND (EUS) RADIAL;  Surgeon: Irving Copas., MD;  Location: [place];  Service: Gastroenterology;  Laterality: N/A;   INTRAOPERATIVE TRANSESOPHAGEAL ECHOCARDIOGRAM N/A 04/07/2013   Procedure: INTRAOPERATIVE TRANSESOPHAGEAL ECHOCARDIOGRAM;  Surgeon: Ivin Poot, MD;  Location: [facility];  Service: Open Heart Surgery;  Laterality: N/A;   IR FLUORO GUIDE CV LINE RIGHT  11/04/2017   IR REMOVAL TUN CV CATH W/O FL  11/27/2017   IR US GUIDE VASC ACCESS RIGHT  11/04/2017   LEFT HEART CATHETERIZATION WITH CORONARY ANGIOGRAM N/A 04/05/2013   Procedure: LEFT HEART CATHETERIZATION WITH CORONARY ANGIOGRAM;  Surgeon: Peter M Martinique, MD;  Location: [facility] CATH LAB;  Service: Cardiovascular;  Laterality: N/A;   LOWER EXTREMITY ANGIOGRAM Bilateral 01/06/2013   Procedure: LOWER EXTREMITY ANGIOGRAM;  Surgeon: Conrad Mattawana, MD;  Location: [facility] CATH LAB;  Service: Cardiovascular;  Laterality: Bilateral;   LOWER EXTREMITY ANGIOGRAM Right 01/13/2013   Procedure: LOWER EXTREMITY ANGIOGRAM;  Surgeon: Conrad H[place]ingport, MD;  Location: [facility] CATH LAB;  Service: Cardiovascular;  Laterality: Right;   SKIN CANCER EXCISION  2017   THROAT SURGERY  1983   polyps   TONSILLECTOMY AND ADENOIDECTOMY  ~ [place] Medications    Prior to Admission medications   Medication Sig Start Date End Date Taking? Authorizing Provider  amLODipine (NORVASC) 5 MG tablet Take 1 tablet (5 mg total) by mouth daily. 07/12/18  Yes Tammi Klippel, Sherin, DO  atorvastatin (LIPITOR) 40 MG tablet TAKE ONE (1) TABLET BY MOUTH EVERY DAY AT 6:00PM Patient taking differently: Take 40 mg by mouth daily at 6 PM.  11/17/16  Yes Larey Dresser, MD  carvedilol (COREG) 12.5 MG tablet Take 1 tablet (12.5 mg total) by mouth 2 (two) times daily with a meal. 08/30/17  Yes Meccariello, Bernita Raisin, DO  cholecalciferol (VITAMIN D) 25 MCG (1000 UT) tablet Take 2,000 Units by mouth 3 (three) times daily.   Yes [provider]  Darbepoetin Alfa (ARANESP) 40 MCG/0.4ML SOSY injection Inject 0.4 mLs (40 mcg total) into the skin every 30 (thirty) days. 09/28/17  Yes Meccariello, Bernita Raisin, DO  doxazosin (CARDURA) 8 MG tablet Take 1 tablet (8 mg total) by mouth every evening. Patient taking differently: Take 8 mg by mouth daily.  12/01/16  Yes Hensel, Jamal Collin, MD  feeding supplement, ENSURE ENLIVE, (ENSURE ENLIVE) LIQD Take 237 mLs by mouth 3 (three) times daily between meals. 08/07/17  Yes  Steve Rattler, DO  ferrous sulfate (FEROSUL) 325 (65 FE) MG tablet Take 1 tablet (325 mg total) by mouth daily at 12 noon. Patient taking differently: Take 325 mg by mouth daily.  08/31/17  Yes Meccariello, Bernita Raisin, DO  Glycopyrrolate (LONHALA MAGNAIR STARTER KIT) 25 MCG/ML SOLN Inhale 1 mL into the lungs 2 (two) times a day.   Yes [provider]  hydrALAZINE (APRESOLINE) 100 MG tablet TAKE ONE TABLET BY MOUTH EVERY EIGHT HOURS Patient taking differently: Take 100 mg by mouth every 8 (eight) hours.  11/14/16  Yes [name], MD  isosorbide mononitrate (IMDUR) 120 MG 24 hr tablet Take 1 tablet (120 mg total) by mouth daily. 10/02/17  Yes Anderson, Chelsey L, DO  levothyroxine (SYNTHROID, LEVOTHROID) 100 MCG tablet Take 100 mcg by mouth daily before breakfast.    Yes [provider]  nicotine (NICODERM CQ - DOSED IN MG/24 HOURS) 14 mg/24hr patch Place 14 mg onto the skin daily.   Yes [provider]  pantoprazole (PROTONIX) 40 MG tablet Take 1 tablet (40 mg total) by mouth 2 (two) times daily. 04/03/18  Yes Darrelyn Hillock N, DO  torsemide (DEMADEX) 20 MG tablet Take 2 tablets (40 mg total) by mouth daily. 02/16/18  Yes Larey Dresser, MD  umeclidinium bromide (INCRUSE ELLIPTA) 62.5 MCG/INH AEPB Inhale 1 puff into the lungs daily. 06/28/18  Yes Mullis, Kiersten P, DO  Blood Glucose Monitoring Suppl (ONETOUCH VERIO FLEX SYSTEM) w/Device KIT 1 kit by Does not apply route daily. Patient not  taking: Reported on 07/15/2018 10/10/16   McDiarmid, Blane Ohara, MD  cloNIDine (CATAPRES - DOSED IN MG/24 HR) 0.2 mg/24hr patch Place 1 patch (0.2 mg total) onto the skin once a week. Patient not taking: Reported on 07/15/2018 08/09/17   Lucila Maine C, DO  glucose blood (ONETOUCH VERIO) test strip Use as instructed to test three times daily. ICD-10 code: E11.22 Patient not taking: Reported on 07/15/2018 10/15/16   McDiarmid, Blane Ohara, MD  nitroGLYCERIN (NITROSTAT) 0.4 MG SL tablet Place 1 tablet (0.4 mg total) under the tongue every 5 (five) minutes as needed for chest pain. 11/14/16   [name], MD  [facility] DELICA LANCETS 38V MISC 1 each by Does not apply route 3 (three) times daily. ICD-10 code: E11.22 Patient not taking: Reported on 07/15/2018 10/15/16   McDiarmid, Blane Ohara, MD  polyethylene glycol (MIRALAX / Floria Raveling) packet Take 17 g by mouth daily as needed for mild constipation or moderate constipation. Patient not taking: Reported on 07/15/2018 08/07/17   Steve Rattler, DO    Family History Family History  Problem Relation Age of Onset   Cancer Mother        lung cancer   Hypertension Mother    Cancer Sister        patient thinks it was uterine cancer   Hypertension Sister    Heart attack Sister    Stroke Neg Hx     Social History Social History   Tobacco Use   Smoking status: Former Smoker    Packs/day: 1.00    Years: 46.00    Pack years: 46.00    Types: Cigarettes    Start date: 02/04/1967    Quit date: 02/03/2013    Years since quitting: 5.4   Smokeless tobacco: Never Used  Substance Use Topics   Alcohol use: No    Alcohol/week: 0.0 standard drinks   Drug use: No     Allergies   Penicillins  Review of Systems Review of Systems  Constitutional: Positive for fatigue. Negative for chills and fever.  Respiratory: Negative.  Negative for cough and shortness of breath.   Cardiovascular: Negative.  Negative for chest pain.  Gastrointestinal: Positive for  vomiting. Negative for abdominal pain and diarrhea.  All other systems reviewed and are negative.  Physical Exam Updated Vital Signs BP (!) 153/62    Pulse 76    Temp 97.9 F (36.6 C) (Oral)    Resp 18    SpO2 100%   Physical Exam Constitutional:      General: He is not in acute distress.    Appearance: Normal appearance. He is well-developed. He is not ill-appearing or diaphoretic.  HENT:     Head: Normocephalic and atraumatic.     Right Ear: External ear normal.     Left Ear: External ear normal.     Nose: Nose normal.  Eyes:     General: Vision grossly intact. Gaze aligned appropriately.     Pupils: Pupils are equal, round, and reactive to light.  Neck:     Musculoskeletal: Normal range of motion.     Trachea: Trachea and phonation normal. No tracheal deviation.  Pulmonary:     Effort: Pulmonary effort is normal. No respiratory distress.  Abdominal:     General: There is no distension.     Palpations: Abdomen is soft.     Tenderness: There is no abdominal tenderness. There is no guarding or rebound.  Musculoskeletal: Normal range of motion.  Feet:     Left foot:     Amputation: Left leg is amputated above knee.  Skin:    General: Skin is warm and dry.  Neurological:     Mental Status: He is alert.     GCS: GCS eye subscore is 4. GCS verbal subscore is 5. GCS motor subscore is 6.     Comments: Speech is clear and goal oriented, follows commands Major Cranial nerves without deficit, no facial droop Moves extremities without ataxia, coordination intact  Psychiatric:        Behavior: Behavior normal.      ED Treatments / Results  Labs (all labs ordered are listed, but only abnormal results are displayed) Labs Reviewed  CBC WITH DIFFERENTIAL/PLATELET - Abnormal; Notable for the following components:      Result Value   RBC 1.94 (*)    Hemoglobin 5.7 (*)    HCT 18.1 (*)    Lymphs Abs 0.5 (*)    Abs Immature Granulocytes 0.10 (*)    All other components within  normal limits  COMPREHENSIVE METABOLIC PANEL - Abnormal; Notable for the following components:   CO2 21 (*)    Glucose, Bld 272 (*)    BUN 111 (*)    Creatinine, Ser 3.26 (*)    Calcium 8.1 (*)    Total Protein 5.8 (*)    Albumin 2.9 (*)    AST 9 (*)    GFR calc non Af Amer 19 (*)    GFR calc Af Amer 22 (*)    All other components within normal limits  NOVEL CORONAVIRUS, NAA (HOSPITAL ORDER, SEND-OUT TO REF LAB)  LIPASE, BLOOD  TYPE AND SCREEN  PREPARE RBC (CROSSMATCH)  PREPARE RBC (CROSSMATCH)    EKG None  Radiology No results found.  Procedures .Critical Care Performed by: Deliah Boston, PA-C Authorized by: Deliah Boston, PA-C   Critical care provider statement:    Critical care time (minutes):  35  Critical care was necessary to treat or prevent imminent or life-threatening deterioration of the following conditions:  Circulatory failure (Anemia; 5.7 hemoglobin)   Critical care was time spent personally by me on the following activities:  Discussions with consultants, evaluation of patient's response to treatment, examination of patient, ordering and performing treatments and interventions, ordering and review of laboratory studies, ordering and review of radiographic studies, pulse oximetry, re-evaluation of patient's condition, obtaining history from patient or surrogate, review of old charts and development of treatment plan with patient or surrogate   (including critical care time)  Medications Ordered in ED Medications  0.9 %  sodium chloride infusion (Manually program via Guardrails IV Fluids) (has no administration in time range)     Initial Impression / Assessment and Plan / ED Course  I have reviewed the triage vital signs and the nursing notes.  Pertinent labs & imaging results that were available during my care of the patient were reviewed by me and considered in my medical decision making (see chart for details).  Clinical Course as of Jul 15 1718  Thu Jul 14, 3456  [id number] 68 year old male with history of gastric cancer and prior history of GI bleeds here after having some coffee-ground emesis after leaving his oncology clinic.  His labs show worsening of his hemoglobin.  He appears in no distress and his blood pressures are okay.  He will likely need to be admitted to the hospital for IV transfusion.   [MB]  3419 Discussed with hospitalist, has asked that we increase transfusion from 1 unit to 2 units and consult GI.   [BM]    Clinical Course User Index [BM] Deliah Boston, PA-C [MB] Hayden Rasmussen, MD   CBC with hemoglobin 5.7, decreased from 8.14 days ago Type and screen to be negative CMP with elevated BUN and creatinine appears baseline  Case discussed with Dr. Melina Copa who has seen and evaluated the patient, agrees with transfusion here in the ER.  No indication for imaging at this time. - Consult called to hospitalist service for admission and will see patient in ER.  They have asked that 2 units instead of 1 unit be given here in the ER, order changed.  Consult called to GI.  Reevaluated resting comfortably no acute distress, sleeping easily arousable to voice denies any pain, vital signs stable.  Agreeable to admission. - Patient has been admitted to hospital service for further evaluation and management.   Note: Portions of this report may have been transcribed using voice recognition software. Every effort was made to ensure accuracy; however, inadvertent computerized transcription errors may still be present. Final Clinical Impressions(s) / ED Diagnoses   Final diagnoses:  Symptomatic anemia  Gastrointestinal hemorrhage, unspecified gastrointestinal hemorrhage type    ED Discharge Orders    None       Deliah Boston, PA-C 07/15/18 1721    Gari Crown 07/15/18 1721    Daleen Bo, MD 07/16/18 2217

## 2018-07-15 NOTE — ED Notes (Signed)
Bed: WA02 Expected date:  Expected time:  Means of arrival:  Comments: EMS-needs transfusion

## 2018-07-15 NOTE — H&P (Signed)
History and Physical    Leonard [name] VQM:[id number] DOB: 1950-07-21 DOA: 07/15/2018  PCP: Kathrene Alu, MD  Patient coming from: Home  I have personally briefly reviewed patient's old medical records in [place]  Chief Complaint: Hematemesis  HPI: Leonard [name] is a 68 y.o. male with medical history significant of type 2 diabetes mellitus, recurrent GI bleed, gastric adenocarcinoma, hypertension, hypothyroidism, CAD, PAD status post CABG x3, CKD stage IV, hyperlipidemia who presented to ER with a complaint of hematemesis x2.  Reportedly, patient went to cancer center to get his blood drawn and he was told that his hemoglobin was 6.7.  They were going to arrange outpatient transfusion for him tomorrow however he had 2 episodes of hematemesis while he was going home from cancer center and he was redirected to emergency department.  Patient himself had no other complaint by the time he arrived to the emergency department.  When I talked to the patient, he was sleepy and slightly lethargic and did not want to talk to me much.  I had full PPE including and 95 so he claimed that he was having hard time understanding me and did not want to talk much.  ED Course: Upon arrival to the emergency department, patient's vitals were completely stable.  Repeat CBC showed hemoglobin of 5.7.  Type and unit and transfusion orders were done and hospitalist service was consulted for admission.  Of note, patient was hospitalized at [facility] from 07/06/2018 through 07/11/2018 for GI bleed.  In reading the discharge summary, it sounds like he was seen by GI as well as oncologist and he was deemed to not be a good candidate for surgery or any endoscopy so decision was made for palliative radiation therapy.  He received total of 4 unit of PRBC transfusion during that hospitalization and was discharged home with hemoglobin of 8.1.  Review of Systems: As per HPI otherwise 10 point review of systems  negative.    Past Medical History:  Diagnosis Date  . Acute on chronic diastolic heart failure ([place]) 07/04/2014  . Anemia of renal disease   . Atherosclerotic peripheral vascular disease with gangrene ([place]) 02/14/2017  . CAD (coronary artery disease) 04/06/2013  . Cancer ([place])    skin cancer-  . CHF (congestive heart failure) ([place])   . Chronic anemia 02/28/2016  . Chronic deep vein thrombosis (DVT) of femoral vein of left lower extremity ([place]) 09/03/2016  . Chronic respiratory failure with hypoxia ([place]) 08/25/2017  . CKD (chronic kidney disease) stage 4, GFR 15-29 ml/min (HCC) 01/15/2015   Follows with [facility].  Last visits:  07/23/16: No change in meds. Worried that if we increase diuretics could lead to another AKI  . CKD (chronic kidney disease), stage IV ([place])    07/07/2017  . Claudication ([place])   . Collapse of left talus 01/14/2017  . COPD GOLD III  02/16/2014   Quit smoking 2014  - PFT's  04/06/13   FEV1 1.28 (44 % ) ratio 48  p no % improvement from saba p ?  prior to study with DLCO  43/54 % corrects to 97  % for alv volume - 01/31/2016  After extensive coaching HFA effectiveness =    75% from baseline 0 > changed to sprivia respimat   - PFT's  03/17/2016  FEV1 2.25  (81 % ) ratio 88  p 2 % improvement from saba p spiriva respimat x 2  prior to study with  . Coronary  artery disease   . Diabetes ([place])   . Diabetic polyneuropathy associated with type 2 diabetes mellitus ([place]) 01/14/2017  . Diabetic retinopathy ([place]) 11/13/2015   Per Dr. Erenest Rasher eye exam in 10/17/15, mild background diabetic retinopathy  . Fatigue associated with anemia 01/21/2016  . Gangrene of toe ([place])    right 5th/notes 01/04/2013   . HCAP (healthcare-associated pneumonia)   . Hematuria 06/27/2015  . High cholesterol   . History of tobacco use 08/23/2013   Smoked pack per day for 46 years Quit smoking - 02/03/2013   . Hypertension   . Hypothyroidism 11/22/2014  . Idiopathic chronic gout of left foot without tophus    . Iron deficiency anemia 08/03/2015  . Left foot pain 10/29/2016  . Long term (current) use of anticoagulants [Z79.01] 09/09/2016  . MGUS (monoclonal gammopathy of unknown significance) 01/15/2015  . Mixed hyperlipidemia 07/17/2008  . Morbid obesity due to excess calories ([place]) 03/17/2016  . PAD (peripheral artery disease) ([place])   . Pleural effusion   . Pneumonitis 07/26/2017  . Pulmonary artery hypertension ([place]) 07/25/2017  . PVD - hx of Rt SFA PTA and s/p Rt 4-5th toe amp Dec 2014 04/05/2013  . 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 Respiratory failure ([place]) 08/13/2017  . Respiratory failure with hypoxia ([place]) 09/23/2017  . 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 Secondary hypertension   . Shortness of breath dyspnea    with exertion  . Skin lesion of face 02/22/2015  . SOB (shortness of breath)   . Type 2 diabetes mellitus with pressure callus ([place]) 12/29/2016  . Type II diabetes mellitus (HCC)    Type II  . Type II diabetes mellitus with complication [facility])     Past Surgical History:  Procedure Laterality Date  . ABDOMINAL AORTOGRAM W/LOWER EXTREMITY N/A 03/12/2017   Procedure: ABDOMINAL AORTOGRAM W/LOWER EXTREMITY;  Surgeon: Conrad Skagway, MD;  Location: [place] CV LAB;  Service: Cardiovascular;  Laterality: N/A;  . AMPUTATION Right 01/28/2013   Procedure: RAY AMPUTATION RIGHT  4th & 5th TOE;  Surgeon: Rosetta Posner, MD;  Location: [place];  Service: Vascular;  Laterality: Right;  . AMPUTATION Left 03/15/2017   Procedure: AMPUTATION ABOVE KNEE;  Surgeon: Waynetta Sandy, MD;  Location: [place];  Service: Vascular;  Laterality: Left;  . ANGIOPLASTY / STENTING FEMORAL Right 01/13/2013   superficial femoral artery x 2 (3 mm x 60 mm, 4 mm x 60 mm)  . AV FISTULA PLACEMENT Left 02/11/2016   Procedure: LEFT UPPER ARM BRACHIAL CEPHALIC ARTERIOVENOUS (AV) FISTULA CREATION;  Surgeon: Conrad , MD;  Location: [place];  Service: Vascular;  Laterality: Left;  . BIOPSY  03/29/2018   Procedure: BIOPSY;   Surgeon: Rush Landmark Telford Nab., MD;  Location: [place];  Service: Gastroenterology;;  . BIOPSY  05/31/2018   Procedure: BIOPSY;  Surgeon: Irving Copas., MD;  Location: [place];  Service: Gastroenterology;;  . COLONOSCOPY W/ POLYPECTOMY    . CORONARY ARTERY BYPASS GRAFT N/A 04/07/2013   Procedure: CORONARY ARTERY BYPASS GRAFTING (CABG);  Surgeon: Ivin Poot, MD;  Location: [place];  Service: Open Heart Surgery;  Laterality: N/A;  . ESOPHAGOGASTRODUODENOSCOPY (EGD) WITH PROPOFOL N/A 03/29/2018   Procedure: ESOPHAGOGASTRODUODENOSCOPY (EGD) WITH PROPOFOL;  Surgeon: Rush Landmark Telford Nab., MD;  Location: [place];  Service: Gastroenterology;  Laterality: N/A;  . ESOPHAGOGASTRODUODENOSCOPY (EGD) WITH PROPOFOL N/A 05/31/2018   Procedure: ESOPHAGOGASTRODUODENOSCOPY (EGD) WITH PROPOFOL;  Surgeon: Rush Landmark Telford Nab., MD;  Location: [place];  Service: Gastroenterology;  Laterality: N/A;  . EUS N/A 05/31/2018   Procedure: UPPER ENDOSCOPIC ULTRASOUND (EUS) RADIAL;  Surgeon: Rush Landmark Telford Nab., MD;  Location: [place];  Service: Gastroenterology;  Laterality: N/A;  . INTRAOPERATIVE TRANSESOPHAGEAL ECHOCARDIOGRAM N/A 04/07/2013   Procedure: INTRAOPERATIVE TRANSESOPHAGEAL ECHOCARDIOGRAM;  Surgeon: Ivin Poot, MD;  Location: [place];  Service: Open Heart Surgery;  Laterality: N/A;  . IR FLUORO GUIDE CV LINE RIGHT  11/04/2017  . IR REMOVAL TUN CV CATH W/O FL  11/27/2017  . IR US GUIDE VASC ACCESS RIGHT  11/04/2017  . LEFT HEART CATHETERIZATION WITH CORONARY ANGIOGRAM N/A 04/05/2013   Procedure: LEFT HEART CATHETERIZATION WITH CORONARY ANGIOGRAM;  Surgeon: Peter M Martinique, MD;  Location: [facility] CATH LAB;  Service: Cardiovascular;  Laterality: N/A;  . LOWER EXTREMITY ANGIOGRAM Bilateral 01/06/2013   Procedure: LOWER EXTREMITY ANGIOGRAM;  Surgeon: Conrad [place]walk, MD;  Location: [facility] CATH LAB;  Service: Cardiovascular;  Laterality: Bilateral;  . LOWER EXTREMITY ANGIOGRAM Right 01/13/2013    Procedure: LOWER EXTREMITY ANGIOGRAM;  Surgeon: Conrad [place], MD;  Location: [facility] CATH LAB;  Service: Cardiovascular;  Laterality: Right;  . SKIN CANCER EXCISION  2017  . THROAT SURGERY  1983   polyps  . TONSILLECTOMY AND ADENOIDECTOMY  ~ 1965     reports that he quit smoking about 5 years ago. His smoking use included cigarettes. He started smoking about 51 years ago. He has a 46.00 pack-year smoking history. He has never used smokeless tobacco. He reports that he does not drink alcohol or use drugs.  Allergies  Allergen Reactions  . Penicillins Other (See Comments)    Has patient had a PCN reaction causing immediate rash, facial/tongue/throat swelling, SOB or lightheadedness with hypotension: Unknown Has patient had a PCN reaction causing severe rash involving mucus membranes or skin necrosis: Unknown Has patient had a PCN reaction that required hospitalization: Unknown Has patient had a PCN reaction occurring within the last 10 years: Unknown If all of the above answers are "NO", then may proceed with Cephalosporin use.     Family History  Problem Relation Age of Onset  . Cancer Mother        lung cancer  . Hypertension Mother   . Cancer Sister        patient thinks it was uterine cancer  . Hypertension Sister   . Heart attack Sister   . Stroke Neg Hx     Prior to Admission medications   Medication Sig Start Date End Date Taking? Authorizing Provider  amLODipine (NORVASC) 5 MG tablet Take 1 tablet (5 mg total) by mouth daily. 07/12/18   Caroline More, DO  atorvastatin (LIPITOR) 40 MG tablet TAKE ONE (1) TABLET BY MOUTH EVERY DAY AT 6:00PM Patient taking differently: Take 40 mg by mouth daily at 6 PM.  11/17/16   Larey Dresser, MD  Blood Glucose Monitoring Suppl ([name]) w/Device KIT 1 kit by Does not apply route daily. 10/10/16   McDiarmid, Blane Ohara, MD  carvedilol (COREG) 12.5 MG tablet Take 1 tablet (12.5 mg total) by mouth 2 (two) times daily with a  meal. 08/30/17   Meccariello, Bernita Raisin, DO  cholecalciferol (VITAMIN D) 25 MCG (1000 UT) tablet Take 2,000 Units by mouth 3 (three) times daily.    [provider]  cloNIDine (CATAPRES - DOSED IN MG/24 HR) 0.2 mg/24hr patch Place 1 patch (0.2 mg total) onto the skin once a week. Patient taking differently: Place 0.2 mg onto the skin every Tuesday.  08/09/17   Steve Rattler, DO  Darbepoetin Alfa (ARANESP) 40 MCG/0.4ML SOSY injection Inject 0.4 mLs (40 mcg total) into the skin every 30 (thirty) days. 09/28/17   Meccariello, Bernita Raisin, DO  doxazosin (CARDURA) 8 MG tablet Take 1 tablet (8 mg total) by mouth every evening. Patient taking differently: Take 8 mg by mouth daily.  12/01/16   Zenia Resides, MD  feeding supplement, ENSURE ENLIVE, (ENSURE ENLIVE) LIQD Take 237 mLs by mouth 3 (three) times daily between meals. 08/07/17   Steve Rattler, DO  ferrous sulfate (FEROSUL) 325 (65 FE) MG tablet Take 1 tablet (325 mg total) by mouth daily at 12 noon. Patient taking differently: Take 325 mg by mouth daily.  08/31/17   Meccariello, Bernita Raisin, DO  glucose blood (ONETOUCH VERIO) test strip Use as instructed to test three times daily. ICD-10 code: E11.22 10/15/16   McDiarmid, Blane Ohara, MD  Glycopyrrolate [facility] MAGNAIR STARTER KIT) 25 MCG/ML SOLN Inhale 1 mL into the lungs 2 (two) times a day.    [provider]  hydrALAZINE (APRESOLINE) 100 MG tablet TAKE ONE TABLET BY MOUTH EVERY EIGHT HOURS Patient taking differently: Take 100 mg by mouth every 8 (eight) hours.  11/14/16   [name], MD  isosorbide mononitrate (IMDUR) 120 MG 24 hr tablet Take 1 tablet (120 mg total) by mouth daily. 10/02/17   Anderson, Chelsey L, DO  levothyroxine (SYNTHROID, LEVOTHROID) 100 MCG tablet Take 100 mcg by mouth daily before breakfast.     [provider]  nitroGLYCERIN (NITROSTAT) 0.4 MG SL tablet Place 1 tablet (0.4 mg total) under the tongue every 5 (five) minutes as needed for chest pain.  11/14/16   [name], MD  ondansetron (ZOFRAN) 4 MG tablet Take 4 mg by mouth every 8 (eight) hours as needed for nausea or vomiting.    [provider]  [facility] DELICA LANCETS 72B MISC 1 each by Does not apply route 3 (three) times daily. ICD-10 code: E11.22 10/15/16   McDiarmid, Blane Ohara, MD  pantoprazole (PROTONIX) 40 MG tablet Take 1 tablet (40 mg total) by mouth 2 (two) times daily. 04/03/18   Patriciaann Clan, DO  polyethylene glycol (MIRALAX / GLYCOLAX) packet Take 17 g by mouth daily as needed for mild constipation or moderate constipation. Patient taking differently: Take 17 g by mouth daily as needed for mild constipation.  08/07/17   Steve Rattler, DO  torsemide (DEMADEX) 20 MG tablet Take 2 tablets (40 mg total) by mouth daily. 02/16/18   Larey Dresser, MD  umeclidinium bromide (INCRUSE ELLIPTA) 62.5 MCG/INH AEPB Inhale 1 puff into the lungs daily. Patient not taking: Reported on 07/06/2018 06/28/18   Danna Hefty, DO    Physical Exam: Vitals:   07/15/18 1530 07/15/18 1600 07/15/18 1630 07/15/18 1700  BP: 138/60 (!) 151/62 (!) 145/58 (!) 153/62  Pulse: 75 73 76 76  Resp: '18 18 18 18  ' Temp:   97.9 F (36.6 C)   TempSrc:   Oral   SpO2: 100% 100% 100% 100%    Constitutional: NAD, calm, comfortable Vitals:   07/15/18 1530 07/15/18 1600 07/15/18 1630 07/15/18 1700  BP: 138/60 (!) 151/62 (!) 145/58 (!) 153/62  Pulse: 75 73 76 76  Resp: '18 18 18 18  ' Temp:   97.9 F (36.6 C)   TempSrc:   Oral   SpO2: 100% 100% 100% 100%   Eyes: PERRL, lids and conjunctivae normal ENMT: Mucous membranes are moist.  Posterior pharynx clear of any exudate or lesions.Normal dentition.  Neck: normal, supple, no masses, no thyromegaly Respiratory: clear to auscultation bilaterally, no wheezing, no crackles. Normal respiratory effort. No accessory muscle use.  Cardiovascular: Regular rate and rhythm, no murmurs / rubs / gallops. No extremity edema. 2+ pedal pulses. No carotid  bruits.  Abdomen: no tenderness, no masses palpated. No hepatosplenomegaly. Bowel sounds positive.  Musculoskeletal: no clubbing / cyanosis. No joint deformity upper and lower extremities. Good ROM, no contractures. Normal muscle tone.  Skin: no rashes, lesions, ulcers. No induration Neurologic: CN 2-12 grossly intact. Sensation intact, DTR normal. Strength 5/5 in all 4.  Psychiatric: Normal judgment and insight. Alert and oriented x 3. Normal mood.    Labs on Admission: I have personally reviewed following labs and imaging studies  CBC: Recent Labs  Lab 07/09/18 0159 07/09/18 1511 07/10/18 0230 07/11/18 0454 07/15/18 1455  WBC 6.4  --  5.6 5.0 7.2  NEUTROABS  --   --   --   --  5.9  HGB 7.3* 8.1* 7.7* 8.1* 5.7*  HCT 21.7* 23.9* 23.0* 24.4* 18.1*  MCV 88.6  --  86.5 88.1 93.3  PLT 142*  --  140* 161 381   Basic Metabolic Panel: Recent Labs  Lab 07/09/18 0159 07/10/18 0230 07/11/18 0454 07/15/18 1455  NA 140 138 139 139  K 3.8 3.9 4.1 5.1  CL 109 107 106 107  CO2 20* 21* 21* 21*  GLUCOSE 217* 192* 222* 272*  BUN 65* 67* 69* 111*  CREATININE 3.29* 3.57* 3.54* 3.26*  CALCIUM 8.2* 8.2* 8.5* 8.1*   GFR: Estimated Creatinine Clearance: 21.3 mL/min (A) (by C-G formula based on SCr of 3.26 mg/dL (H)). Liver Function Tests: Recent Labs  Lab 07/15/18 1455  AST 9*  ALT 8  ALKPHOS 72  BILITOT 0.4  PROT 5.8*  ALBUMIN 2.9*   Recent Labs  Lab 07/15/18 1455  LIPASE 45   No results for input(s): AMMONIA in the last 168 hours. Coagulation Profile: No results for input(s): INR, PROTIME in the last 168 hours. Cardiac Enzymes: No results for input(s): CKTOTAL, CKMB, CKMBINDEX, TROPONINI in the last 168 hours. BNP (last 3 results) No results for input(s): PROBNP in the last 8760 hours. HbA1C: No results for input(s): HGBA1C in the last 72 hours. CBG: No results for input(s): GLUCAP in the last 168 hours. Lipid Profile: No results for input(s): CHOL, HDL, LDLCALC,  TRIG, CHOLHDL, LDLDIRECT in the last 72 hours. Thyroid Function Tests: No results for input(s): TSH, T4TOTAL, FREET4, T3FREE, THYROIDAB in the last 72 hours. Anemia Panel: No results for input(s): VITAMINB12, FOLATE, FERRITIN, TIBC, IRON, RETICCTPCT in the last 72 hours. Urine analysis:    Component Value Date/Time   COLORURINE YELLOW 06/24/2018 0628   APPEARANCEUR CLOUDY (A) 06/24/2018 0628   LABSPEC 1.012 06/24/2018 0628   PHURINE 7.0 06/24/2018 0628   GLUCOSEU NEGATIVE 06/24/2018 0628   HGBUR NEGATIVE 06/24/2018 0628   BILIRUBINUR NEGATIVE 06/24/2018 0628   BILIRUBINUR NEG 06/26/2015 1540   KETONESUR NEGATIVE 06/24/2018 0628   PROTEINUR 100 (A) 06/24/2018 0628   UROBILINOGEN 1.0 06/26/2015 1540   UROBILINOGEN 0.2 Jun 14, 202015 2156   NITRITE NEGATIVE 06/24/2018 0628   LEUKOCYTESUR LARGE (A) 06/24/2018 0628    Radiological Exams on Admission: No results found.  EKG: Independently reviewed.   Assessment/Plan Active Problems:   CAD (coronary artery disease)   S/P CABG x 3   Hypothyroidism   MGUS (monoclonal gammopathy of unknown significance)   CKD (chronic  kidney disease) stage 4, GFR 15-29 ml/min (HCC)   Type II diabetes mellitus with complication (HCC)   Diabetic polyneuropathy associated with type 2 diabetes mellitus (HCC)   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HCC)   Upper GI bleed   Hematemesis/vomiting blood   Acute blood loss anemia    Hematemesis/upper GI bleed/acute blood loss anemia/history of gastric adenocarcinoma: ER had ordered 1 unit of transfusion.  I have requested them to add another unit to make it to.  We will admit him under hospitalist service and monitor his H&H every 8 hours and transfuse if hemoglobin less than 7.  Start on Protonix 40 mg twice daily.  I requested ER to consult GI as well so that we can have their opinion again to see if he will require an EGD here.  We will keep him n.p.o. for now.  Type 2 diabetes mellitus with polyneuropathy:  His last hemoglobin A1c was 6.5.  He is only on sliding scale insulin which is what we are going to continue here.  History of chronic diastolic congestive heart failure: Stable.  Resume home diuretics.  Hypothyroidism: Resume Synthroid.  Hypertension: Blood pressure stable.  Resume home medications.  PRN hydralazine.  BPH: Resume doxazosin.  CKD stage IV: At this time.  Continue to watch.  COPD: Stable.  PRN duo nebs if he is negative for COVID-19.  DVT prophylaxis: SCD, avoiding heparin products due to active upper GI bleed Code Status: Full code (previously he has been Family Communication: Discussed with patient.  No family available. Disposition Plan: Admit to [facility] for now and possibly discharged home in next 48 hours. Consults called: GI by ER Admission status: Inpatient, I expect 2 midnights stay.   Darliss Cheney MD [facility] Pager [phone]  If 7PM-7AM, please contact night-coverage [email or web address] Password [email or web address]  07/15/2018, 5:10 PM

## 2018-07-15 NOTE — ED Notes (Signed)
ED Provider at bedside. 

## 2018-07-16 ENCOUNTER — Ambulatory Visit: Payer: Medicare Other

## 2018-07-16 ENCOUNTER — Ambulatory Visit
Admission: RE | Admit: 2018-07-16 | Discharge: 2018-07-16 | Disposition: A | Discharge status: Home / Self Care | Payer: Medicare Other | Source: Ambulatory Visit | Attending: Radiation Oncology | Admitting: Radiation Oncology

## 2018-07-16 ENCOUNTER — Other Ambulatory Visit: Payer: Medicare Other

## 2018-07-16 DIAGNOSIS — C163 Malignant neoplasm of pyloric antrum: Secondary | ICD-10-CM

## 2018-07-16 LAB — TYPE AND SCREEN
ABO/RH(D): B NEG
Antibody Screen: NEGATIVE
Unit division: 0
Unit division: 0

## 2018-07-16 LAB — BPAM RBC
Blood Product Expiration Date: 202007012359
Blood Product Expiration Date: 202007062359
ISSUE DATE / TIME: 202006111810
ISSUE DATE / TIME: 202006112355
Unit Type and Rh: 1700
Unit Type and Rh: 1700

## 2018-07-16 LAB — COMPREHENSIVE METABOLIC PANEL
ALT: 8 U/L (ref 0–44)
AST: 9 U/L — ABNORMAL LOW (ref 15–41)
Albumin: 2.9 g/dL — ABNORMAL LOW (ref 3.5–5.0)
Alkaline Phosphatase: 71 U/L (ref 38–126)
Anion gap: 11 (ref 5–15)
BUN: 103 mg/dL — ABNORMAL HIGH (ref 8–23)
CO2: 21 mmol/L — ABNORMAL LOW (ref 22–32)
Calcium: 8.2 mg/dL — ABNORMAL LOW (ref 8.9–10.3)
Chloride: 111 mmol/L (ref 98–111)
Creatinine, Ser: 3.16 mg/dL — ABNORMAL HIGH (ref 0.61–1.24)
GFR calc Af Amer: 22 mL/min — ABNORMAL LOW (ref >60)
GFR calc non Af Amer: 19 mL/min — ABNORMAL LOW (ref >60)
Glucose, Bld: 160 mg/dL — ABNORMAL HIGH (ref 70–99)
Potassium: 4 mmol/L (ref 3.5–5.1)
Sodium: 143 mmol/L (ref 135–145)
Total Bilirubin: 0.3 mg/dL (ref 0.3–1.2)
Total Protein: 5.7 g/dL — ABNORMAL LOW (ref 6.5–8.1)

## 2018-07-16 LAB — HEMOGLOBIN A1C
Hgb A1c MFr Bld: 6.2 % — ABNORMAL HIGH (ref 4.8–5.6)
Mean Plasma Glucose: 131.24 mg/dL

## 2018-07-16 LAB — NOVEL CORONAVIRUS, NAA (HOSP ORDER, SEND-OUT TO REF LAB; TAT 18-24 HRS): SARS-CoV-2, NAA: NOT DETECTED

## 2018-07-16 LAB — CBC
HCT: 21.4 % — ABNORMAL LOW (ref 39.0–52.0)
Hemoglobin: 7 g/dL — ABNORMAL LOW (ref 13.0–17.0)
MCH: 30.3 pg (ref 26.0–34.0)
MCHC: 32.7 g/dL (ref 30.0–36.0)
MCV: 92.6 fL (ref 80.0–100.0)
Platelets: 191 10*3/uL (ref 150–400)
RBC: 2.31 MIL/uL — ABNORMAL LOW (ref 4.22–5.81)
RDW: 14.2 % (ref 11.5–15.5)
WBC: 6.3 10*3/uL (ref 4.0–10.5)
nRBC: 0 % (ref 0.0–0.2)

## 2018-07-16 LAB — GLUCOSE, CAPILLARY
Glucose-Capillary: 135 mg/dL — ABNORMAL HIGH (ref 70–99)
Glucose-Capillary: 145 mg/dL — ABNORMAL HIGH (ref 70–99)
Glucose-Capillary: 215 mg/dL — ABNORMAL HIGH (ref 70–99)
Glucose-Capillary: 92 mg/dL (ref 70–99)

## 2018-07-16 LAB — HEMATOCRIT
HCT: 22 % — ABNORMAL LOW (ref 39.0–52.0)
HCT: 22.8 % — ABNORMAL LOW (ref 39.0–52.0)
HCT: 20.8 % — ABNORMAL LOW (ref 39.0–52.0)

## 2018-07-16 LAB — HEMOGLOBIN
Hemoglobin: 7 g/dL — ABNORMAL LOW (ref 13.0–17.0)
Hemoglobin: 7.3 g/dL — ABNORMAL LOW (ref 13.0–17.0)
Hemoglobin: 6.6 g/dL — CL (ref 13.0–17.0)

## 2018-07-16 LAB — HIV ANTIBODY (ROUTINE TESTING W REFLEX): HIV Screen 4th Generation wRfx: NONREACTIVE

## 2018-07-16 NOTE — Progress Notes (Signed)
Report called to [facility], [facility] has been arranged for transport. Pts IV removed with a clean and dry dressing intact. Pt denies pain at this time with no s/s of distress noted.

## 2018-07-16 NOTE — [place] FL2 (Signed)
[place] MEDICAID FL2 LEVEL OF CARE SCREENING TOOL     IDENTIFICATION  [name] Birthdate: 1950-05-05 Sex: male Admission Date (Current Location): 07/15/2018  [facility] and [place] Number:  Herbalist and Address:  [facility]  [place] [address] [place]      Provider Number: [id number]  Attending Physician Name and Address:  Darliss Cheney, MD  Relative Name and Phone Number:       Current Level of Care: Hospital Recommended Level of Care: [place] Prior Approval Number:    Date Approved/Denied:   PASRR Number: [id number]  Discharge Plan: SNF    Current Diagnoses: Patient Active Problem List   Diagnosis Date Noted  . Hematemesis/vomiting blood 07/15/2018  . Acute blood loss anemia 07/15/2018  . Chronic blood loss anemia   . Upper GI bleed 06/23/2018  . Acute renal failure superimposed on chronic kidney disease ([place])   . Abdominal pain   . DNR (do not resuscitate)   . 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 DNR (do not resuscitate) discussion   . Anemia 03/29/2018  . Acute GI bleeding 03/28/2018  . Dysphagia 10/01/2017  . Palliative care by specialist   . Severe comorbid illness   . Moderate malnutrition ([place]) 08/27/2017  . Hypoxemia   . Atelectasis   . Chronic respiratory failure with hypoxia ([place]) 08/25/2017  . Pulmonary artery hypertension ([place]) 07/25/2017  . Pressure injury of skin 07/20/2017  . Goals of care, counseling/discussion 02/11/2017  . Diabetic polyneuropathy associated with type 2 diabetes mellitus ([place]) 01/14/2017  . Pleural effusion   . Chronic anemia 02/28/2016  . Weakness generalized 01/21/2016  . Diabetic retinopathy ([place]) 11/13/2015  . Iron deficiency anemia 08/03/2015  . Type II diabetes mellitus with complication ([place])   . Chronic kidney disease (CKD), stage III (moderate) (HCC)   . MGUS (monoclonal gammopathy of unknown significance) 01/15/2015  .  Deficiency anemia 01/15/2015  . CKD (chronic kidney disease) stage 4, GFR 15-29 ml/min (HCC) 01/15/2015  . Hypothyroidism 11/22/2014  . COPD GOLD III  02/16/2014  . Anemia of renal disease   . Chronic diastolic CHF (congestive heart failure) ([place])   . History of tobacco use 08/23/2013  . 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 CAD (coronary artery disease) 04/06/2013  . PVD - hx of Rt SFA PTA and s/p Rt 4-5th toe amp Dec 2014 04/05/2013  . 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 Mixed hyperlipidemia 07/17/2008    Orientation RESPIRATION BLADDER Height & Weight     Self, Time, Situation, Place  Normal Continent Weight: 173 lb 9.6 oz (78.7 kg) Height:     BEHAVIORAL SYMPTOMS/MOOD NEUROLOGICAL BOWEL NUTRITION STATUS      Continent Diet(See dc summary)  AMBULATORY STATUS COMMUNICATION OF NEEDS Skin   Extensive Assist Verbally Normal                       Personal Care Assistance Level of Assistance  Bathing, Feeding, Dressing Bathing Assistance: Limited assistance Feeding assistance: Independent Dressing Assistance: Limited assistance     Functional Limitations Info  Sight, Hearing, Speech Sight Info: Adequate Hearing Info: Adequate Speech Info: Adequate    SPECIAL CARE FACTORS FREQUENCY  PT (By licensed PT), OT (By licensed OT)     PT Frequency: 5x/week OT Frequency: 5x/week            Contractures Contractures Info: Not present    Additional Factors Info  Code  Status, Allergies Code Status Info: Full Allergies Info: Penicillins           Current Medications (07/16/2018):  This is the current hospital active medication list Current Facility-Administered Medications  Medication Dose Route Frequency Provider Last Rate Last Dose  . 0.9 %  sodium chloride infusion (Manually program via Guardrails IV Fluids)   Intravenous Once Nuala Alpha A, PA-C      . 0.9 %  sodium chloride infusion   Intravenous Continuous Darliss Cheney, MD 125 mL/hr at 07/16/18 0243    .  acetaminophen (TYLENOL) tablet 650 mg  650 mg Oral Q6H PRN Darliss Cheney, MD       Or  . acetaminophen (TYLENOL) suppository 650 mg  650 mg Rectal Q6H PRN Darliss Cheney, MD      . amLODipine (NORVASC) tablet 5 mg  5 mg Oral Daily Darliss Cheney, MD   5 mg at 07/16/18 1015  . atorvastatin (LIPITOR) tablet 40 mg  40 mg Oral q1800 Darliss Cheney, MD   Stopped at 07/15/18 2309  . carvedilol (COREG) tablet 12.5 mg  12.5 mg Oral BID WC Darliss Cheney, MD   12.5 mg at 07/16/18 0808  . doxazosin (CARDURA) tablet 8 mg  8 mg Oral QPM Pahwani, Ravi, MD      . ferrous sulfate tablet 325 mg  325 mg Oral Q1200 Pahwani, Ravi, MD      . hydrALAZINE (APRESOLINE) injection 10 mg  10 mg Intravenous Q6H PRN Pahwani, Ravi, MD      . hydrALAZINE (APRESOLINE) tablet 100 mg  100 mg Oral Q8H Darliss Cheney, MD   Stopped at 07/15/18 2309  . insulin aspart (novoLOG) injection 0-15 Units  0-15 Units Subcutaneous Q4H Darliss Cheney, MD   5 Units at 07/16/18 1259  . isosorbide mononitrate (IMDUR) 24 hr tablet 120 mg  120 mg Oral Daily Darliss Cheney, MD   120 mg at 07/16/18 1015  . levothyroxine (SYNTHROID) tablet 100 mcg  100 mcg Oral QAC breakfast Pahwani, Einar Grad, MD      . nitroGLYCERIN (NITROSTAT) SL tablet 0.4 mg  0.4 mg Sublingual Q5 min PRN Darliss Cheney, MD      . ondansetron (ZOFRAN) tablet 4 mg  4 mg Oral Q6H PRN Darliss Cheney, MD       Or  . ondansetron (ZOFRAN) injection 4 mg  4 mg Intravenous Q6H PRN Pahwani, Ravi, MD      . pantoprazole (PROTONIX) injection 40 mg  40 mg Intravenous Q12H Darliss Cheney, MD   40 mg at 07/16/18 1015  . sodium chloride flush (NS) 0.9 % injection 3 mL  3 mL Intravenous Q12H Darliss Cheney, MD   3 mL at 07/15/18 2153  . torsemide (DEMADEX) tablet 40 mg  40 mg Oral Daily Darliss Cheney, MD   40 mg at 07/16/18 1015   Facility-Administered Medications Ordered in Other Encounters  Medication Dose Route Frequency Provider Last Rate Last Dose  . sodium chloride flush (NS) 0.9 % injection 10 mL  10  mL Intracatheter PRN Heath Lark, MD         Discharge Medications: Please see discharge summary for a list of discharge medications.  Relevant Imaging Results:  Relevant Lab Results:   Additional Information SSN: [id number]  Servando Snare, LCSW

## 2018-07-16 NOTE — Discharge Summary (Signed)
Physician Discharge Summary  Leonard [name] OLM:[id number] DOB: April 30, 1950 DOA: 07/15/2018  PCP: Kathrene Alu, MD  Admit date: 07/15/2018 Discharge date: 07/16/2018  Admitted From: Home Disposition: Home  Recommendations for Outpatient Follow-up:  1. Follow up with PCP next week 2. Please obtain BMP/CBC as soon as possible next week 3. Please follow up on the following pending results:  Home Health: None Equipment/Devices: None  Discharge Condition: Stable CODE STATUS: Full code Diet recommendation: Previous home diet  Subjective: Patient seen and examined.  He has no complaints.  He has not had any vomiting since admission and has tolerated regular diet as well.  Brief/Interim Summary: Leonard [name] is a 68 y.o. male with medical history significant of type 2 diabetes mellitus, recurrent GI bleed, gastric adenocarcinoma, hypertension, hypothyroidism, CAD, PAD status post CABG x3, CKD stage IV, hyperlipidemia who presented to ER with a complaint of hematemesis x2.  Reportedly, patient went to cancer center to get his blood drawn and he was told that his hemoglobin was 6.7.  They were going to arrange outpatient transfusion for him tomorrow however he had 2 episodes of hematemesis while he was going home from cancer center and he was redirected to emergency department. Upon arrival to the emergency department, patient's vitals were completely stable.  Repeat CBC showed hemoglobin of 5.7.  2 units of crossmatched and transfusion were ordered and hospital service was consulted to admit the patient for further management.  We consulted GI and I received a call from oncology yesterday stating that patient is very well-known to GI group and has been seen by 3 GIs in the past as well as 2 surgeons and they have deemed him not a surgical candidate and that is why he started having palliative radiation.  He has a history of frequent upper GI bleed leading to anemia and gets frequent blood  transfusion as an outpatient.  After receiving 2 units of blood transfusion, his repeat hemoglobin this morning was 7 and after that it was 7.3.  Patient is feeling much better and has not had any hematemesis since then and wants to go home.  He has tolerated regular diet as well and is hemodynamically stable so he will be discharged home.  Discharge Diagnoses:  Active Problems:   CAD (coronary artery disease)   S/P CABG x 3   Hypothyroidism   MGUS (monoclonal gammopathy of unknown significance)   CKD (chronic kidney disease) stage 4, GFR 15-29 ml/min (HCC)   Type II diabetes mellitus with complication (HCC)   Diabetic polyneuropathy associated with type 2 diabetes mellitus (HCC)   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HCC)   Upper GI bleed   Hematemesis/vomiting blood   Acute blood loss anemia    Discharge Instructions  Discharge Instructions    Discharge patient   Complete by: As directed    Discharge disposition: 01-Home or Self Care   Discharge patient date: 07/16/2018     Allergies as of 07/16/2018      Reactions   Penicillins Other (See Comments)   Has patient had a PCN reaction causing immediate rash, facial/tongue/throat swelling, SOB or lightheadedness with hypotension: Unknown Has patient had a PCN reaction causing severe rash involving mucus membranes or skin necrosis: Unknown Has patient had a PCN reaction that required hospitalization: Unknown Has patient had a PCN reaction occurring within the last 10 years: Unknown If all of the above answers are "NO", then may proceed with Cephalosporin use.      Medication List  TAKE these medications   amLODipine 5 MG tablet Commonly known as: NORVASC Take 1 tablet (5 mg total) by mouth daily.   atorvastatin 40 MG tablet Commonly known as: LIPITOR TAKE ONE (1) TABLET BY MOUTH EVERY DAY AT 6:00PM What changed: See the new instructions.   carvedilol 12.5 MG tablet Commonly known as: COREG Take 1 tablet (12.5 mg  total) by mouth 2 (two) times daily with a meal.   cholecalciferol 25 MCG (1000 UT) tablet Commonly known as: VITAMIN D Take 2,000 Units by mouth 3 (three) times daily.   cloNIDine 0.2 mg/24hr patch Commonly known as: CATAPRES - Dosed in mg/24 hr Place 1 patch (0.2 mg total) onto the skin once a week.   Darbepoetin Alfa 40 MCG/0.4ML Sosy injection Commonly known as: ARANESP Inject 0.4 mLs (40 mcg total) into the skin every 30 (thirty) days.   doxazosin 8 MG tablet Commonly known as: CARDURA Take 1 tablet (8 mg total) by mouth every evening. What changed: when to take this   feeding supplement (ENSURE ENLIVE) Liqd Take 237 mLs by mouth 3 (three) times daily between meals.   ferrous sulfate 325 (65 FE) MG tablet Commonly known as: FeroSul Take 1 tablet (325 mg total) by mouth daily at 12 noon. What changed: when to take this   glucose blood test strip Commonly known as: OneTouch Verio Use as instructed to test three times daily. ICD-10 code: E11.22   hydrALAZINE 100 MG tablet Commonly known as: APRESOLINE TAKE ONE TABLET BY MOUTH EVERY EIGHT HOURS What changed: See the new instructions.   isosorbide mononitrate 120 MG 24 hr tablet Commonly known as: IMDUR Take 1 tablet (120 mg total) by mouth daily.   levothyroxine 100 MCG tablet Commonly known as: SYNTHROID Take 100 mcg by mouth daily before breakfast.   Lonhala Magnair Starter Kit 25 MCG/ML Soln Generic drug: Glycopyrrolate Inhale 1 mL into the lungs 2 (two) times a day.   nicotine 14 mg/24hr patch Commonly known as: NICODERM CQ - dosed in mg/24 hours Place 14 mg onto the skin daily.   nitroGLYCERIN 0.4 MG SL tablet Commonly known as: NITROSTAT Place 1 tablet (0.4 mg total) under the tongue every 5 (five) minutes as needed for chest pain.   OneTouch Delica Lancets 62B Misc 1 each by Does not apply route 3 (three) times daily. ICD-10 code: E11.22   OneTouch Verio Flex System w/Device Kit 1 kit by Does not  apply route daily.   pantoprazole 40 MG tablet Commonly known as: PROTONIX Take 1 tablet (40 mg total) by mouth 2 (two) times daily.   polyethylene glycol 17 g packet Commonly known as: MIRALAX / GLYCOLAX Take 17 g by mouth daily as needed for mild constipation or moderate constipation.   torsemide 20 MG tablet Commonly known as: DEMADEX Take 2 tablets (40 mg total) by mouth daily.   umeclidinium bromide 62.5 MCG/INH Aepb Commonly known as: INCRUSE ELLIPTA Inhale 1 puff into the lungs daily.      Follow-up Information    Winfrey, Alcario Drought, MD Follow up in 1 week(s).   Specialty: Family Medicine Contact information: [address] [address] [phone]          Allergies  Allergen Reactions  . Penicillins Other (See Comments)    Has patient had a PCN reaction causing immediate rash, facial/tongue/throat swelling, SOB or lightheadedness with hypotension: Unknown Has patient had a PCN reaction causing severe rash involving mucus membranes or skin necrosis: Unknown Has patient  had a PCN reaction that required hospitalization: Unknown Has patient had a PCN reaction occurring within the last 10 years: Unknown If all of the above answers are "NO", then may proceed with Cephalosporin use.     Consultations: GI, over the phone   Procedures/Studies: Nm Myocar Multi W/spect W/wall Motion / Ef  Addendum Date: 06/25/2018    There was no ST segment deviation noted during stress.  Nuclear stress EF: 55%. The left ventricular ejection fraction is normal (55-65%).  This is a low risk study. The stress images are somewhat difficult to assess due to relatively low counts compared to the resting images . There appears to be diffuse attenuation of the entire LV but particularly in the lateral wal  The lateral wall contracts normally and his EF is normal . The patient is asymptomatic.  I think the patient is at low risk for his upcoming gastric surgery    Result  Date: 06/25/2018  There was no ST segment deviation noted during stress.  Nuclear stress EF: 55%. The left ventricular ejection fraction is normal (55-65%).  This is a low risk study. There is no evidence of ischemia and no evidence of previous infarction  The study is normal.    Dg Chest Portable 1 View  Result Date: 06/23/2018 CLINICAL DATA:  Weakness vomiting EXAM: PORTABLE CHEST 1 VIEW COMPARISON:  03/28/2018, PET-CT 04/21/2018 FINDINGS: Possible hazy infiltrate at the left base. Mild cardiomegaly. No pleural effusion or pneumothorax. IMPRESSION: Cardiomegaly.  Possible mild hazy infiltrate at the left base. Electronically Signed   By: Donavan Foil M.D.   On: 06/23/2018 21:27      Discharge Exam: Vitals:   07/16/18 0450 07/16/18 0946  BP: 114/86 (!) 148/58  Pulse: 72 69  Resp: 17   Temp: 98.5 F (36.9 C)   SpO2: 100%    Vitals:   07/16/18 0017 07/16/18 0230 07/16/18 0450 07/16/18 0946  BP: (!) 140/51 (!) 153/60 114/86 (!) 148/58  Pulse: 71 73 72 69  Resp: '17 18 17   ' Temp: 98.6 F (37 C) 98.3 F (36.8 C) 98.5 F (36.9 C)   TempSrc: Oral Oral Oral   SpO2: 94% 98% 100%   Weight:   78.7 kg     General: Pt is alert, awake, not in acute distress Cardiovascular: RRR, S1/S2 +, no rubs, no gallops Respiratory: CTA bilaterally, no wheezing, no rhonchi Abdominal: Soft, NT, ND, bowel sounds + Extremities: no edema, no cyanosis    The results of significant diagnostics from this hospitalization (including imaging, microbiology, ancillary and laboratory) are listed below for reference.     Microbiology: Recent Results (from the past 240 hour(s))  SARS Coronavirus 2 (CEPHEID - Performed in [facility] hospital lab), Hosp Order     Status: None   Collection Time: 07/06/18  6:05 PM   Specimen: Nasopharyngeal Swab  Result Value Ref Range Status   SARS Coronavirus 2 NEGATIVE NEGATIVE Final    Comment: (NOTE) If result is NEGATIVE SARS-CoV-2 target nucleic acids are NOT  DETECTED. The SARS-CoV-2 RNA is generally detectable in upper and lower  respiratory specimens during the acute phase of infection. The lowest  concentration of SARS-CoV-2 viral copies this assay can detect is 250  copies / mL. A negative result does not preclude SARS-CoV-2 infection  and should not be used as the sole basis for treatment or other  patient management decisions.  A negative result may occur with  improper specimen collection / handling, submission of specimen other  than nasopharyngeal swab, presence of viral mutation(s) within the  areas targeted by this assay, and inadequate number of viral copies  (<250 copies / mL). A negative result must be combined with clinical  observations, patient history, and epidemiological information. If result is POSITIVE SARS-CoV-2 target nucleic acids are DETECTED. The SARS-CoV-2 RNA is generally detectable in upper and lower  respiratory specimens dur ing the acute phase of infection.  Positive  results are indicative of active infection with SARS-CoV-2.  Clinical  correlation with patient history and other diagnostic information is  necessary to determine patient infection status.  Positive results do  not rule out bacterial infection or co-infection with other viruses. If result is PRESUMPTIVE POSTIVE SARS-CoV-2 nucleic acids MAY BE PRESENT.   A presumptive positive result was obtained on the submitted specimen  and confirmed on repeat testing.  While 2019 novel coronavirus  (SARS-CoV-2) nucleic acids may be present in the submitted sample  additional confirmatory testing may be necessary for epidemiological  and / or clinical management purposes  to differentiate between  SARS-CoV-2 and other Sarbecovirus currently known to infect humans.  If clinically indicated additional testing with an alternate test  methodology [phone]) is advised. The SARS-CoV-2 RNA is generally  detectable in upper and lower respiratory sp ecimens during  the acute  phase of infection. The expected result is Negative. Fact Sheet for Patients:  StrictlyIdeas.no Fact Sheet for Healthcare Providers: BankingDealers.co.za This test is not yet approved or cleared by the [place] FDA and has been authorized for detection and/or diagnosis of SARS-CoV-2 by FDA under an Emergency Use Authorization (EUA).  This EUA will remain in effect (meaning this test can be used) for the duration of the COVID-19 declaration under Section 564(b)(1) of the Act, 21 U.S.C. section 360bbb-3(b)(1), unless the authorization is terminated or revoked sooner. Performed at [facility], [place] [address] [place], [place] [address]   SARS Coronavirus 2 (CEPHEID - Performed in [facility] hospital lab), Hosp Order     Status: None   Collection Time: 07/10/18 10:16 AM   Specimen: Nasopharyngeal Swab  Result Value Ref Range Status   SARS Coronavirus 2 NEGATIVE NEGATIVE Final    Comment: (NOTE) If result is NEGATIVE SARS-CoV-2 target nucleic acids are NOT DETECTED. The SARS-CoV-2 RNA is generally detectable in upper and lower  respiratory specimens during the acute phase of infection. The lowest  concentration of SARS-CoV-2 viral copies this assay can detect is 250  copies / mL. A negative result does not preclude SARS-CoV-2 infection  and should not be used as the sole basis for treatment or other  patient management decisions.  A negative result may occur with  improper specimen collection / handling, submission of specimen other  than nasopharyngeal swab, presence of viral mutation(s) within the  areas targeted by this assay, and inadequate number of viral copies  (<250 copies / mL). A negative result must be combined with clinical  observations, patient history, and epidemiological information. If result is POSITIVE SARS-CoV-2 target nucleic acids are DETECTED. The SARS-CoV-2 RNA is generally detectable in  upper and lower  respiratory specimens dur ing the acute phase of infection.  Positive  results are indicative of active infection with SARS-CoV-2.  Clinical  correlation with patient history and other diagnostic information is  necessary to determine patient infection status.  Positive results do  not rule out bacterial infection or co-infection with other viruses. If result is PRESUMPTIVE POSTIVE SARS-CoV-2 nucleic acids MAY BE PRESENT.  A presumptive positive result was obtained on the submitted specimen  and confirmed on repeat testing.  While 2019 novel coronavirus  (SARS-CoV-2) nucleic acids may be present in the submitted sample  additional confirmatory testing may be necessary for epidemiological  and / or clinical management purposes  to differentiate between  SARS-CoV-2 and other Sarbecovirus currently known to infect humans.  If clinically indicated additional testing with an alternate test  methodology [phone]) is advised. The SARS-CoV-2 RNA is generally  detectable in upper and lower respiratory sp ecimens during the acute  phase of infection. The expected result is Negative. Fact Sheet for Patients:  StrictlyIdeas.no Fact Sheet for Healthcare Providers: BankingDealers.co.za This test is not yet approved or cleared by the [place] FDA and has been authorized for detection and/or diagnosis of SARS-CoV-2 by FDA under an Emergency Use Authorization (EUA).  This EUA will remain in effect (meaning this test can be used) for the duration of the COVID-19 declaration under Section 564(b)(1) of the Act, 21 U.S.C. section 360bbb-3(b)(1), unless the authorization is terminated or revoked sooner. Performed at [facility], [place] [address] [place], [place] [address]      Labs: BNP (last 3 results) Recent Labs    12/03/17 1011 03/28/18 1758 06/23/18 2039  BNP 436.1* 171.7* 762.8*   Basic Metabolic Panel: Recent  Labs  Lab 07/10/18 0230 07/11/18 0454 07/15/18 1455 07/16/18 0727  NA 138 139 139 143  K 3.9 4.1 5.1 4.0  CL 107 106 107 111  CO2 21* 21* 21* 21*  GLUCOSE 192* 222* 272* 160*  BUN 67* 69* 111* 103*  CREATININE 3.57* 3.54* 3.26* 3.16*  CALCIUM 8.2* 8.5* 8.1* 8.2*   Liver Function Tests: Recent Labs  Lab 07/15/18 1455 07/16/18 0727  AST 9* 9*  ALT 8 8  ALKPHOS 72 71  BILITOT 0.4 0.3  PROT 5.8* 5.7*  ALBUMIN 2.9* 2.9*   Recent Labs  Lab 07/15/18 1455  LIPASE 45   No results for input(s): AMMONIA in the last 168 hours. CBC: Recent Labs  Lab 07/10/18 0230 07/11/18 0454 07/15/18 1455 07/16/18 0727 07/16/18 1054  WBC 5.6 5.0 7.2 6.3  --   NEUTROABS  --   --  5.9  --   --   HGB 7.7* 8.1* 5.7* 7.0*  7.0* 7.3*  HCT 23.0* 24.4* 18.1* 21.4*  22.0* 22.8*  MCV 86.5 88.1 93.3 92.6  --   PLT 140* 161 193 191  --    Cardiac Enzymes: No results for input(s): CKTOTAL, CKMB, CKMBINDEX, TROPONINI in the last 168 hours. BNP: Invalid input(s): POCBNP CBG: Recent Labs  Lab 07/15/18 2021 07/16/18 0032 07/16/18 0413 07/16/18 0737 07/16/18 1200  GLUCAP 167* 92 145* 135* 215*   D-Dimer No results for input(s): DDIMER in the last 72 hours. Hgb A1c Recent Labs    07/16/18 0727  HGBA1C 6.2*   Lipid Profile No results for input(s): CHOL, HDL, LDLCALC, TRIG, CHOLHDL, LDLDIRECT in the last 72 hours. Thyroid function studies No results for input(s): TSH, T4TOTAL, T3FREE, THYROIDAB in the last 72 hours.  Invalid input(s): FREET3 Anemia work up No results for input(s): VITAMINB12, FOLATE, FERRITIN, TIBC, IRON, RETICCTPCT in the last 72 hours. Urinalysis    Component Value Date/Time   COLORURINE YELLOW 06/24/2018 0628   APPEARANCEUR CLOUDY (A) 06/24/2018 0628   LABSPEC 1.012 06/24/2018 0628   PHURINE 7.0 06/24/2018 0628   GLUCOSEU NEGATIVE 06/24/2018 0628   HGBUR NEGATIVE 06/24/2018 0628   BILIRUBINUR NEGATIVE 06/24/2018 3151  BILIRUBINUR NEG 06/26/2015 1540    KETONESUR NEGATIVE 06/24/2018 0628   PROTEINUR 100 (A) 06/24/2018 0628   UROBILINOGEN 1.0 06/26/2015 1540   UROBILINOGEN 0.2 April 30, 202015 2156   NITRITE NEGATIVE 06/24/2018 0628   LEUKOCYTESUR LARGE (A) 06/24/2018 0628   Sepsis Labs Invalid input(s): PROCALCITONIN,  WBC,  LACTICIDVEN Microbiology Recent Results (from the past 240 hour(s))  SARS Coronavirus 2 (CEPHEID - Performed in [facility] hospital lab), Hosp Order     Status: None   Collection Time: 07/06/18  6:05 PM   Specimen: Nasopharyngeal Swab  Result Value Ref Range Status   SARS Coronavirus 2 NEGATIVE NEGATIVE Final    Comment: (NOTE) If result is NEGATIVE SARS-CoV-2 target nucleic acids are NOT DETECTED. The SARS-CoV-2 RNA is generally detectable in upper and lower  respiratory specimens during the acute phase of infection. The lowest  concentration of SARS-CoV-2 viral copies this assay can detect is 250  copies / mL. A negative result does not preclude SARS-CoV-2 infection  and should not be used as the sole basis for treatment or other  patient management decisions.  A negative result may occur with  improper specimen collection / handling, submission of specimen other  than nasopharyngeal swab, presence of viral mutation(s) within the  areas targeted by this assay, and inadequate number of viral copies  (<250 copies / mL). A negative result must be combined with clinical  observations, patient history, and epidemiological information. If result is POSITIVE SARS-CoV-2 target nucleic acids are DETECTED. The SARS-CoV-2 RNA is generally detectable in upper and lower  respiratory specimens dur ing the acute phase of infection.  Positive  results are indicative of active infection with SARS-CoV-2.  Clinical  correlation with patient history and other diagnostic information is  necessary to determine patient infection status.  Positive results do  not rule out bacterial infection or co-infection with other viruses. If  result is PRESUMPTIVE POSTIVE SARS-CoV-2 nucleic acids MAY BE PRESENT.   A presumptive positive result was obtained on the submitted specimen  and confirmed on repeat testing.  While 2019 novel coronavirus  (SARS-CoV-2) nucleic acids may be present in the submitted sample  additional confirmatory testing may be necessary for epidemiological  and / or clinical management purposes  to differentiate between  SARS-CoV-2 and other Sarbecovirus currently known to infect humans.  If clinically indicated additional testing with an alternate test  methodology [phone] is advised. The SARS-CoV-2 RNA is generally  detectable in upper and lower respiratory sp ecimens during the acute  phase of infection. The expected result is Negative. Fact Sheet for Patients:  StrictlyIdeas.no Fact Sheet for Healthcare Providers: BankingDealers.co.za This test is not yet approved or cleared by the [place] FDA and has been authorized for detection and/or diagnosis of SARS-CoV-2 by FDA under an Emergency Use Authorization (EUA).  This EUA will remain in effect (meaning this test can be used) for the duration of the COVID-19 declaration under Section 564(b)(1) of the Act, 21 U.S.C. section 360bbb-3(b)(1), unless the authorization is terminated or revoked sooner. Performed at [facility], [place] [address] [place], [place] [address]   SARS Coronavirus 2 (CEPHEID - Performed in [facility] hospital lab), Hosp Order     Status: None   Collection Time: 07/10/18 10:16 AM   Specimen: Nasopharyngeal Swab  Result Value Ref Range Status   SARS Coronavirus 2 NEGATIVE NEGATIVE Final    Comment: (NOTE) If result is NEGATIVE SARS-CoV-2 target nucleic acids are NOT DETECTED. The SARS-CoV-2 RNA is  generally detectable in upper and lower  respiratory specimens during the acute phase of infection. The lowest  concentration of SARS-CoV-2 viral copies this assay can  detect is 250  copies / mL. A negative result does not preclude SARS-CoV-2 infection  and should not be used as the sole basis for treatment or other  patient management decisions.  A negative result may occur with  improper specimen collection / handling, submission of specimen other  than nasopharyngeal swab, presence of viral mutation(s) within the  areas targeted by this assay, and inadequate number of viral copies  (<250 copies / mL). A negative result must be combined with clinical  observations, patient history, and epidemiological information. If result is POSITIVE SARS-CoV-2 target nucleic acids are DETECTED. The SARS-CoV-2 RNA is generally detectable in upper and lower  respiratory specimens dur ing the acute phase of infection.  Positive  results are indicative of active infection with SARS-CoV-2.  Clinical  correlation with patient history and other diagnostic information is  necessary to determine patient infection status.  Positive results do  not rule out bacterial infection or co-infection with other viruses. If result is PRESUMPTIVE POSTIVE SARS-CoV-2 nucleic acids MAY BE PRESENT.   A presumptive positive result was obtained on the submitted specimen  and confirmed on repeat testing.  While 2019 novel coronavirus  (SARS-CoV-2) nucleic acids may be present in the submitted sample  additional confirmatory testing may be necessary for epidemiological  and / or clinical management purposes  to differentiate between  SARS-CoV-2 and other Sarbecovirus currently known to infect humans.  If clinically indicated additional testing with an alternate test  methodology [phone]) is advised. The SARS-CoV-2 RNA is generally  detectable in upper and lower respiratory sp ecimens during the acute  phase of infection. The expected result is Negative. Fact Sheet for Patients:  StrictlyIdeas.no Fact Sheet for Healthcare  Providers: BankingDealers.co.za This test is not yet approved or cleared by the [place] FDA and has been authorized for detection and/or diagnosis of SARS-CoV-2 by FDA under an Emergency Use Authorization (EUA).  This EUA will remain in effect (meaning this test can be used) for the duration of the COVID-19 declaration under Section 564(b)(1) of the Act, 21 U.S.C. section 360bbb-3(b)(1), unless the authorization is terminated or revoked sooner. Performed at [facility], [place] [address] [place], [place] [address]      Time coordinating discharge: 30 minutes  SIGNED:   Darliss Cheney, MD  [facility] 07/16/2018, 2:17 PM Pager [phone]  If 7PM-7AM, please contact night-coverage [email or web address] Password [email or web address]

## 2018-07-16 NOTE — Consult Note (Signed)
   [facility] CM Inpatient Consult   07/16/2018  Leonard [name] [birth year] [id number]   Patient screened for [facility] care management service needs for readmission within 30 days, 4 hospitalizations in the past 6 months, and 44% extreme risk score for unplanned readmission.  Chart review reveals that patient lives at skilled nursing facility - [place]. No THN needs.  Netta Cedars, MSN, [facility] Liaison Nurse Mobile Phone [phone]  Toll free office [phone]

## 2018-07-16 NOTE — TOC Transition Note (Signed)
Transition of Care [facility]) - CM/SW Discharge Note   Patient Details  Name: Leonard [name] MRN: [id number] Date of Birth: [birth year]  Transition of Care [facility]) CM/SW Contact:  Servando Snare, LCSW Phone Number: 07/16/2018, 4:01 PM   Clinical Narrative:   Consulted at dc. Patient returning to [facility]. LCSW faxed dc docs to facility. Packet on chart.   RN report #: [id number]           Patient Goals and CMS Choice        Discharge Placement                       Discharge Plan and Services                                     Social Determinants of Health (SDOH) Interventions     Readmission Risk Interventions No flowsheet data found.

## 2018-07-16 NOTE — Discharge Instructions (Signed)
Gastric Cancer  Gastric cancer is also called stomach cancer. It is an abnormal growth of cancerous (malignant) cells in the stomach. What are the causes? The exact cause of gastric cancer is not known. What increases the risk? You are more likely to develop this condition if you:  Are older than age 67.  Are male.  Are of Asian-American, Pacific Islander, Hispanic, or African-American descent.  Eat a diet that includes a lot of foods that are smoked, salted, or pickled.  Use any tobacco products, including cigarettes, chewing tobacco, or e-cigarettes.  Drink alcohol excessively.  Are overweight.  Have a history of: ? Stomach surgery. ? Chronic gastritis. ? Gastric polyps. ? Pernicious anemia.  Have any of the following: ? An H. pylori stomach infection. ? A family history of gastric cancer. ? Blood type A. ? An Epstein-Barr virus (EBV) infection. ? Common variable immune deficiency (CVID).  Work in conditions that expose you to coal, metal, or rubber. What are the signs or symptoms? Symptoms may include:  Loss of appetite.  Feeling full after eating a small meal.  Trouble swallowing.  Nausea.  Pain in the abdomen, usually above the belly button (navel).  Excessive gas or belching.  Heartburn and indigestion.  Swelling or fluid buildup in the abdomen.  Losing weight without trying (unintentional weight loss).  Vomiting. This may include vomiting blood.  Bloody stool (feces).  Low number of red blood cells (anemia).  Fatigue. How is this diagnosed? This condition may be diagnosed based on:  Your symptoms and medical history.  A physical exam. This may involve having a procedure in which a tube with a light and a camera on the end is inserted through your mouth and moved down your throat to your stomach (endoscopic exam).  Blood tests.  A procedure in which you swallow a solution (barium) before X-rays are done to evaluate the stomach and other  structures (barium swallow). The barium shows up well on X-rays, making it easier for your health care provider to see possible problems.  Imaging tests, such as a CT scan, MRI, X-ray, or PET scan.  Removal of a sample of stomach cells to be tested for cancer (biopsy). Your cancer will be assessed (staged) to determine how severe it is and how much it has spread (metastasized). How is this treated? Treatment for gastric cancer depends on the type and stage of the cancer. Treatment may include one or more of the following:  Surgery to remove as much of the cancer as possible (gastrectomy). This surgery may also involve removing nearby lymph nodes to be checked for cancer cells.  Medicines that kill cancer cells (chemotherapy).  High-energy rays that kill cancer cells (radiation therapy).  Targeted therapy. This targets specific parts of cancer cells and the area around them to block the growth and the spread of the cancer. Targeted therapy can help to limit the damage to healthy cells.  Medicines that help your body's disease-fighting system (immune system) fight cancer cells (immunotherapy). Follow these instructions at home: Eating and drinking  Some of your treatments might affect your appetite and your ability to digest certain foods. If you are having problems eating or if you do not have an appetite, meet with a diet and nutrition specialist (dietitian).  If you have side effects that affect eating, it may help to: ? Eat smaller meals and snacks often. ? Drink high-nutrition and high-calorie shakes or supplements. ? Eat bland and soft foods that are easy to eat. ?  Avoid foods that are hot, spicy, or hard to swallow.  Follow instructions from your health care provider about eating or drinking restrictions. You may need to avoid or eat less of these items: ? Red meat. ? Processed meat, such as deli meat. ? Salty foods. ? Smoked foods. ? Pickled foods.  Do not drink  alcohol. General instructions  Do not use any products that contain nicotine or tobacco, such as cigarettes and e-cigarettes. If you need help quitting, ask your health care provider.  Take over-the-counter and prescription medicines only as told by your health care provider.  Consider joining a support group for people who have been diagnosed with gastric cancer.  Work with your health care provider to manage any side effects of treatment.  Keep all follow-up visits as told by your health care provider. This is important. Where to find more information  American Cancer Society: www.cancer.[place] www.cancer.gov Contact a health care provider if:  You have a fever.  You have difficulty eating.  You have problems with your medicines.  You continue to lose weight without trying.  You have nausea, diarrhea, sweating, and red skin (flushing) after eating (dumping syndrome). Get help right away if:  You have any of the following problems that do not get better with medicine: ? Pain. ? Nausea. ? Vomiting. ? Diarrhea.  You have severe pain.  You vomit blood or black material that looks like coffee grounds.  You have trouble breathing.  You faint. Summary  Gastric cancer is also called stomach cancer. It is an abnormal growth of cancerous (malignant) cells in the stomach.  Your cancer will be assessed (staged) to determine how severe it is and how much it has spread (metastasized).  Work with your health care provider to manage any side effects of treatment.  Consider joining a support group for people who have been diagnosed with gastric cancer. This information is not intended to replace advice given to you by your health care provider. Make sure you discuss any questions you have with your health care provider. Document Released: 10/25/2003 Document Revised: 10/30/2016 Document Reviewed: 10/30/2016 Elsevier Interactive Patient Education   2019 [employer].

## 2018-07-19 ENCOUNTER — Telehealth: Payer: Self-pay

## 2018-07-19 ENCOUNTER — Ambulatory Visit
Admission: RE | Admit: 2018-07-19 | Discharge: 2018-07-19 | Disposition: A | Discharge status: Home / Self Care | Payer: Medicare Other | Source: Ambulatory Visit | Attending: Radiation Oncology | Admitting: Radiation Oncology

## 2018-07-19 ENCOUNTER — Other Ambulatory Visit: Payer: Self-pay

## 2018-07-19 DIAGNOSIS — C801 Malignant (primary) neoplasm, unspecified: Secondary | ICD-10-CM | POA: Diagnosis not present

## 2018-07-19 DIAGNOSIS — C163 Malignant neoplasm of pyloric antrum: Secondary | ICD-10-CM | POA: Diagnosis not present

## 2018-07-19 DIAGNOSIS — D649 Anemia, unspecified: Secondary | ICD-10-CM | POA: Diagnosis not present

## 2018-07-19 DIAGNOSIS — D500 Iron deficiency anemia secondary to blood loss (chronic): Secondary | ICD-10-CM | POA: Diagnosis not present

## 2018-07-19 DIAGNOSIS — D002 Carcinoma in situ of stomach: Secondary | ICD-10-CM | POA: Diagnosis not present

## 2018-07-19 DIAGNOSIS — K922 Gastrointestinal hemorrhage, unspecified: Secondary | ICD-10-CM | POA: Diagnosis not present

## 2018-07-19 DIAGNOSIS — Z1159 Encounter for screening for other viral diseases: Secondary | ICD-10-CM | POA: Diagnosis not present

## 2018-07-19 DIAGNOSIS — E039 Hypothyroidism, unspecified: Secondary | ICD-10-CM | POA: Diagnosis not present

## 2018-07-19 DIAGNOSIS — N184 Chronic kidney disease, stage 4 (severe): Secondary | ICD-10-CM | POA: Diagnosis not present

## 2018-07-19 NOTE — Telephone Encounter (Signed)
08/17/18 at 130 pm appt with Dr Rush Landmark letter mailed with appt info

## 2018-07-19 NOTE — Telephone Encounter (Signed)
-----   Message from Irving Copas., MD sent at 07/12/2018  9:52 AM EDT ----- [name], Thank you for the update. I was away last week but saw that he remains in the hospital. I hope the XRT helps. Orel Hord, please have a follow up in clinic with me in 4-weeks set up in person. Thanks. GM ----- Message ----- From: Hayden Pedro, PA-C Sent: 07/06/2018   2:52 PM EDT To: Irving Copas., MD  Dr. Rush Landmark- he was not a great surgical candidate, and we're going to treat him with definitive XRT. Since medonc won't follow him long term, would you see him in a few months? [name] said if he did better after XRT, could consider surgery at a later time. I have his facility in agreement to manage the anemia for now, finger's crossed.  Thanks, [name]

## 2018-07-20 ENCOUNTER — Telehealth: Payer: Self-pay | Admitting: Radiation Oncology

## 2018-07-20 ENCOUNTER — Ambulatory Visit
Admission: RE | Admit: 2018-07-20 | Discharge: 2018-07-20 | Disposition: A | Discharge status: Home / Self Care | Payer: Medicare Other | Source: Ambulatory Visit | Attending: Radiation Oncology | Admitting: Radiation Oncology

## 2018-07-20 ENCOUNTER — Inpatient Hospital Stay (HOSPITAL_COMMUNITY)
Admission: EM | Admit: 2018-07-20 | Discharge: 2018-07-23 | DRG: 812 | Disposition: A | Discharge status: Skilled Nursing Facility | Payer: Medicare Other | Source: Skilled Nursing Facility | Attending: Internal Medicine | Admitting: Internal Medicine

## 2018-07-20 ENCOUNTER — Other Ambulatory Visit: Payer: Self-pay

## 2018-07-20 ENCOUNTER — Encounter (HOSPITAL_COMMUNITY): Payer: Self-pay

## 2018-07-20 DIAGNOSIS — D649 Anemia, unspecified: Secondary | ICD-10-CM | POA: Diagnosis not present

## 2018-07-20 DIAGNOSIS — Z7951 Long term (current) use of inhaled steroids: Secondary | ICD-10-CM

## 2018-07-20 DIAGNOSIS — D500 Iron deficiency anemia secondary to blood loss (chronic): Secondary | ICD-10-CM | POA: Diagnosis present

## 2018-07-20 DIAGNOSIS — E1122 Type 2 diabetes mellitus with diabetic chronic kidney disease: Secondary | ICD-10-CM | POA: Diagnosis present

## 2018-07-20 DIAGNOSIS — E1151 Type 2 diabetes mellitus with diabetic peripheral angiopathy without gangrene: Secondary | ICD-10-CM | POA: Diagnosis present

## 2018-07-20 DIAGNOSIS — D631 Anemia in chronic kidney disease: Secondary | ICD-10-CM | POA: Diagnosis present

## 2018-07-20 DIAGNOSIS — E1142 Type 2 diabetes mellitus with diabetic polyneuropathy: Secondary | ICD-10-CM | POA: Diagnosis present

## 2018-07-20 DIAGNOSIS — Z85828 Personal history of other malignant neoplasm of skin: Secondary | ICD-10-CM

## 2018-07-20 DIAGNOSIS — E11319 Type 2 diabetes mellitus with unspecified diabetic retinopathy without macular edema: Secondary | ICD-10-CM | POA: Diagnosis present

## 2018-07-20 DIAGNOSIS — Z79899 Other long term (current) drug therapy: Secondary | ICD-10-CM

## 2018-07-20 DIAGNOSIS — C163 Malignant neoplasm of pyloric antrum: Secondary | ICD-10-CM | POA: Diagnosis present

## 2018-07-20 DIAGNOSIS — E782 Mixed hyperlipidemia: Secondary | ICD-10-CM | POA: Diagnosis present

## 2018-07-20 DIAGNOSIS — R41841 Cognitive communication deficit: Secondary | ICD-10-CM | POA: Diagnosis present

## 2018-07-20 DIAGNOSIS — E039 Hypothyroidism, unspecified: Secondary | ICD-10-CM | POA: Diagnosis present

## 2018-07-20 DIAGNOSIS — N189 Chronic kidney disease, unspecified: Secondary | ICD-10-CM | POA: Diagnosis present

## 2018-07-20 DIAGNOSIS — K922 Gastrointestinal hemorrhage, unspecified: Secondary | ICD-10-CM | POA: Diagnosis present

## 2018-07-20 DIAGNOSIS — M2550 Pain in unspecified joint: Secondary | ICD-10-CM | POA: Diagnosis not present

## 2018-07-20 DIAGNOSIS — Z794 Long term (current) use of insulin: Secondary | ICD-10-CM | POA: Diagnosis not present

## 2018-07-20 DIAGNOSIS — E118 Type 2 diabetes mellitus with unspecified complications: Secondary | ICD-10-CM | POA: Diagnosis present

## 2018-07-20 DIAGNOSIS — Z923 Personal history of irradiation: Secondary | ICD-10-CM | POA: Diagnosis not present

## 2018-07-20 DIAGNOSIS — Z79890 Hormone replacement therapy (postmenopausal): Secondary | ICD-10-CM | POA: Diagnosis not present

## 2018-07-20 DIAGNOSIS — N184 Chronic kidney disease, stage 4 (severe): Secondary | ICD-10-CM | POA: Diagnosis present

## 2018-07-20 DIAGNOSIS — J449 Chronic obstructive pulmonary disease, unspecified: Secondary | ICD-10-CM | POA: Diagnosis present

## 2018-07-20 DIAGNOSIS — Z89612 Acquired absence of left leg above knee: Secondary | ICD-10-CM

## 2018-07-20 DIAGNOSIS — R7889 Finding of other specified substances, not normally found in blood: Secondary | ICD-10-CM | POA: Diagnosis not present

## 2018-07-20 DIAGNOSIS — R1312 Dysphagia, oropharyngeal phase: Secondary | ICD-10-CM | POA: Diagnosis present

## 2018-07-20 DIAGNOSIS — I2510 Atherosclerotic heart disease of native coronary artery without angina pectoris: Secondary | ICD-10-CM | POA: Diagnosis present

## 2018-07-20 DIAGNOSIS — Z7189 Other specified counseling: Secondary | ICD-10-CM | POA: Diagnosis not present

## 2018-07-20 DIAGNOSIS — Z1159 Encounter for screening for other viral diseases: Secondary | ICD-10-CM | POA: Diagnosis not present

## 2018-07-20 DIAGNOSIS — R58 Hemorrhage, not elsewhere classified: Secondary | ICD-10-CM | POA: Diagnosis not present

## 2018-07-20 DIAGNOSIS — D509 Iron deficiency anemia, unspecified: Secondary | ICD-10-CM | POA: Diagnosis present

## 2018-07-20 DIAGNOSIS — M6281 Muscle weakness (generalized): Secondary | ICD-10-CM | POA: Diagnosis present

## 2018-07-20 DIAGNOSIS — Z7401 Bed confinement status: Secondary | ICD-10-CM | POA: Diagnosis not present

## 2018-07-20 DIAGNOSIS — E1165 Type 2 diabetes mellitus with hyperglycemia: Secondary | ICD-10-CM | POA: Diagnosis not present

## 2018-07-20 DIAGNOSIS — E088 Diabetes mellitus due to underlying condition with unspecified complications: Secondary | ICD-10-CM | POA: Diagnosis present

## 2018-07-20 DIAGNOSIS — Z951 Presence of aortocoronary bypass graft: Secondary | ICD-10-CM

## 2018-07-20 DIAGNOSIS — C170 Malignant neoplasm of duodenum: Secondary | ICD-10-CM | POA: Diagnosis present

## 2018-07-20 DIAGNOSIS — Z515 Encounter for palliative care: Secondary | ICD-10-CM | POA: Diagnosis not present

## 2018-07-20 DIAGNOSIS — Z87891 Personal history of nicotine dependence: Secondary | ICD-10-CM

## 2018-07-20 LAB — CBC WITH DIFFERENTIAL/PLATELET
Abs Immature Granulocytes: 0.06 10*3/uL (ref 0.00–0.07)
Basophils Absolute: 0 10*3/uL (ref 0.0–0.1)
Basophils Relative: 0 %
Eosinophils Absolute: 0.6 10*3/uL — ABNORMAL HIGH (ref 0.0–0.5)
Eosinophils Relative: 13 %
HCT: 19.2 % — ABNORMAL LOW (ref 39.0–52.0)
Hemoglobin: 6 g/dL — CL (ref 13.0–17.0)
Immature Granulocytes: 1 %
Lymphocytes Relative: 8 %
Lymphs Abs: 0.4 10*3/uL — ABNORMAL LOW (ref 0.7–4.0)
MCH: 30.2 pg (ref 26.0–34.0)
MCHC: 31.3 g/dL (ref 30.0–36.0)
MCV: 96.5 fL (ref 80.0–100.0)
Monocytes Absolute: 0.4 10*3/uL (ref 0.1–1.0)
Monocytes Relative: 8 %
Neutro Abs: 3.4 10*3/uL (ref 1.7–7.7)
Neutrophils Relative %: 70 %
Platelets: 179 10*3/uL (ref 150–400)
RBC: 1.99 MIL/uL — ABNORMAL LOW (ref 4.22–5.81)
RDW: 15 % (ref 11.5–15.5)
WBC: 4.8 10*3/uL (ref 4.0–10.5)
nRBC: 0 % (ref 0.0–0.2)

## 2018-07-20 LAB — COMPREHENSIVE METABOLIC PANEL
ALT: 11 U/L (ref 0–44)
AST: 13 U/L — ABNORMAL LOW (ref 15–41)
Albumin: 3.1 g/dL — ABNORMAL LOW (ref 3.5–5.0)
Alkaline Phosphatase: 81 U/L (ref 38–126)
Anion gap: 9 (ref 5–15)
BUN: 61 mg/dL — ABNORMAL HIGH (ref 8–23)
CO2: 20 mmol/L — ABNORMAL LOW (ref 22–32)
Calcium: 8 mg/dL — ABNORMAL LOW (ref 8.9–10.3)
Chloride: 111 mmol/L (ref 98–111)
Creatinine, Ser: 2.63 mg/dL — ABNORMAL HIGH (ref 0.61–1.24)
GFR calc Af Amer: 28 mL/min — ABNORMAL LOW (ref >60)
GFR calc non Af Amer: 24 mL/min — ABNORMAL LOW (ref >60)
Glucose, Bld: 286 mg/dL — ABNORMAL HIGH (ref 70–99)
Potassium: 3.7 mmol/L (ref 3.5–5.1)
Sodium: 140 mmol/L (ref 135–145)
Total Bilirubin: 0.3 mg/dL (ref 0.3–1.2)
Total Protein: 5.9 g/dL — ABNORMAL LOW (ref 6.5–8.1)

## 2018-07-20 LAB — GLUCOSE, CAPILLARY: Glucose-Capillary: 211 mg/dL — ABNORMAL HIGH (ref 70–99)

## 2018-07-20 LAB — CBC
HCT: 17.1 % — ABNORMAL LOW (ref 39.0–52.0)
Hemoglobin: 5.5 g/dL — CL (ref 13.0–17.0)
MCH: 30.6 pg (ref 26.0–34.0)
MCHC: 32.2 g/dL (ref 30.0–36.0)
MCV: 95 fL (ref 80.0–100.0)
Platelets: 184 10*3/uL (ref 150–400)
RBC: 1.8 MIL/uL — ABNORMAL LOW (ref 4.22–5.81)
RDW: 14.7 % (ref 11.5–15.5)
WBC: 4.9 10*3/uL (ref 4.0–10.5)
nRBC: 0 % (ref 0.0–0.2)

## 2018-07-20 LAB — PREPARE RBC (CROSSMATCH)

## 2018-07-20 MED ORDER — SODIUM CHLORIDE 0.9 % IV SOLN
INTRAVENOUS | Status: DC
  Administered 2018-07-20 – 2018-07-21 (×2): via INTRAVENOUS

## 2018-07-20 MED ORDER — SODIUM CHLORIDE 0.9% IV SOLUTION: Freq: Once | INTRAVENOUS | Status: DC

## 2018-07-20 MED ORDER — INSULIN ASPART 100 UNIT/ML ~~LOC~~ SOLN
0.0000 [IU] | Freq: Every day | SUBCUTANEOUS | Status: DC
  Administered 2018-07-20: 23:00:00 2 [IU] via SUBCUTANEOUS

## 2018-07-20 MED ORDER — ONDANSETRON HCL 4 MG PO TABS: 4.0000 mg | ORAL_TABLET | Freq: Four times a day (QID) | ORAL | Status: DC | PRN

## 2018-07-20 MED ORDER — PANTOPRAZOLE SODIUM 40 MG IV SOLR
40.0000 mg | Freq: Two times a day (BID) | INTRAVENOUS | Status: DC
  Administered 2018-07-20 – 2018-07-21 (×2): 40 mg via INTRAVENOUS
  Filled 2018-07-20 (×3): qty 40

## 2018-07-20 MED ORDER — PANTOPRAZOLE SODIUM 40 MG IV SOLR
40.0000 mg | Freq: Two times a day (BID) | INTRAVENOUS | Status: DC
  Filled 2018-07-20: qty 40

## 2018-07-20 MED ORDER — ONDANSETRON HCL 4 MG/2ML IJ SOLN: 4.0000 mg | Freq: Four times a day (QID) | INTRAMUSCULAR | Status: DC | PRN

## 2018-07-20 MED ORDER — INSULIN ASPART 100 UNIT/ML ~~LOC~~ SOLN
0.0000 [IU] | Freq: Three times a day (TID) | SUBCUTANEOUS | Status: DC
  Administered 2018-07-21: 3 [IU] via SUBCUTANEOUS
  Administered 2018-07-21: 09:00:00 2 [IU] via SUBCUTANEOUS
  Administered 2018-07-21 – 2018-07-22 (×2): 1 [IU] via SUBCUTANEOUS
  Administered 2018-07-22 (×2): 2 [IU] via SUBCUTANEOUS
  Administered 2018-07-23: 08:00:00 1 [IU] via SUBCUTANEOUS
  Administered 2018-07-23: 11:00:00 2 [IU] via SUBCUTANEOUS

## 2018-07-20 MED ORDER — SODIUM CHLORIDE 0.9% IV SOLUTION
Freq: Once | INTRAVENOUS | Status: AC
  Administered 2018-07-20: 23:00:00 via INTRAVENOUS

## 2018-07-20 NOTE — Telephone Encounter (Signed)
I spoke with the patient's niece Karie Kirks and it appears the patient was dc'd to the facility on 07/16/2018 after another unit of blood on 07/15/2018. From what I can tell he received a total of 4 units during his recent admission, and hgb at the time of DC was 6.6. we will continue to work with his facility in making sure hgb is stable to improved. He has not had any additional episodes of hematemesis per family.

## 2018-07-20 NOTE — ED Notes (Signed)
ED TO INPATIENT HANDOFF REPORT  ED Nurse Name and Phone #: Noni Saupe Name/Age/Gender Leonard [name] 68 y.o. male Room/Bed: WA19/WA19  Code Status   Code Status: Prior  Home/SNF/Other given to floor Patient oriented to: self, place, time and situation Is this baseline? Yes   Triage Complete: Triage complete  Chief Complaint Low HGB  Triage Note Per EMS, Pt is coming from [place] at [facility]. EMS was called due to pt reportedly having a HGB of 5.9. Pt has hx of GI bleed, and stomach cancer. Pt has no complaints.    Allergies Allergies  Allergen Reactions  . Penicillins Other (See Comments)    Has patient had a PCN reaction causing immediate rash, facial/tongue/throat swelling, SOB or lightheadedness with hypotension: Unknown Has patient had a PCN reaction causing severe rash involving mucus membranes or skin necrosis: Unknown Has patient had a PCN reaction that required hospitalization: Unknown Has patient had a PCN reaction occurring within the last 10 years: Unknown If all of the above answers are "NO", then may proceed with Cephalosporin use.     Level of Care/Admitting Diagnosis ED Disposition    ED Disposition Condition Comment   Admit  Hospital Area: [place] [[id number]]  Level of Care: Telemetry [5]  Admit to tele based on following criteria: Complex arrhythmia (Bradycardia/Tachycardia)  Covid Evaluation: N/A  Diagnosis: Symptomatic anemia [6378588]  Admitting Physician: Elwyn Reach [[id number]]  Attending Physician: Elwyn Reach [[id number]]  Estimated length of stay: past midnight tomorrow  Certification:: I certify this patient will need inpatient services for at least 2 midnights  PT Class (Do Not Modify): Inpatient [101]  PT Acc Code (Do Not Modify): Private [1]       B Medical/Surgery History Past Medical History:  Diagnosis Date  . Acute on chronic diastolic heart failure ([place]) 07/04/2014  . Anemia of  renal disease   . Atherosclerotic peripheral vascular disease with gangrene ([place]) 02/14/2017  . CAD (coronary artery disease) 04/06/2013  . Cancer ([place])    skin cancer-  . CHF (congestive heart failure) ([place])   . Chronic anemia 02/28/2016  . Chronic deep vein thrombosis (DVT) of femoral vein of left lower extremity ([place]) 09/03/2016  . Chronic respiratory failure with hypoxia ([place]) 08/25/2017  . CKD (chronic kidney disease) stage 4, GFR 15-29 ml/min (HCC) 01/15/2015   Follows with [facility].  Last visits:  07/23/16: No change in meds. Worried that if we increase diuretics could lead to another AKI  . CKD (chronic kidney disease), stage IV ([place])    07/07/2017  . Claudication ([place])   . Collapse of left talus 01/14/2017  . COPD GOLD III  02/16/2014   Quit smoking 2014  - PFT's  04/06/13   FEV1 1.28 (44 % ) ratio 48  p no % improvement from saba p ?  prior to study with DLCO  43/54 % corrects to 97  % for alv volume - 01/31/2016  After extensive coaching HFA effectiveness =    75% from baseline 0 > changed to sprivia respimat   - PFT's  03/17/2016  FEV1 2.25  (81 % ) ratio 88  p 2 % improvement from saba p spiriva respimat x 2  prior to study with  . Coronary artery disease   . Diabetes ([place])   . Diabetic polyneuropathy associated with type 2 diabetes mellitus ([place]) 01/14/2017  . Diabetic retinopathy ([place]) 11/13/2015   Per Dr. Erenest Rasher eye exam in 10/17/15, mild background  diabetic retinopathy  . Fatigue associated with anemia 01/21/2016  . Gangrene of toe ([place])    right 5th/notes 01/04/2013   . HCAP (healthcare-associated pneumonia)   . Hematuria 06/27/2015  . High cholesterol   . History of tobacco use 08/23/2013   Smoked pack per day for 46 years Quit smoking - 02/03/2013   . Hypertension   . Hypothyroidism 11/22/2014  . Idiopathic chronic gout of left foot without tophus   . Iron deficiency anemia 08/03/2015  . Left foot pain 10/29/2016  . Long term (current) use of anticoagulants [Z79.01] 09/09/2016   . MGUS (monoclonal gammopathy of unknown significance) 01/15/2015  . Mixed hyperlipidemia 07/17/2008  . Morbid obesity due to excess calories ([place]) 03/17/2016  . PAD (peripheral artery disease) ([place])   . Pleural effusion   . Pneumonitis 07/26/2017  . Pulmonary artery hypertension ([place]) 07/25/2017  . PVD - hx of Rt SFA PTA and s/p Rt 4-5th toe amp Dec 2014 04/05/2013  . 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 Respiratory failure ([place]) 08/13/2017  . Respiratory failure with hypoxia ([place]) 09/23/2017  . 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 Secondary hypertension   . Shortness of breath dyspnea    with exertion  . Skin lesion of face 02/22/2015  . SOB (shortness of breath)   . Type 2 diabetes mellitus with pressure callus ([place]) 12/29/2016  . Type II diabetes mellitus (HCC)    Type II  . Type II diabetes mellitus with complication [facility])    Past Surgical History:  Procedure Laterality Date  . ABDOMINAL AORTOGRAM W/LOWER EXTREMITY N/A 03/12/2017   Procedure: ABDOMINAL AORTOGRAM W/LOWER EXTREMITY;  Surgeon: Conrad Olathe, MD;  Location: [place] CV [facility];  Service: Cardiovascular;  Laterality: N/A;  . AMPUTATION Right 01/28/2013   Procedure: RAY AMPUTATION RIGHT  4th & 5th TOE;  Surgeon: Rosetta Posner, MD;  Location: [place];  Service: Vascular;  Laterality: Right;  . AMPUTATION Left 03/15/2017   Procedure: AMPUTATION ABOVE KNEE;  Surgeon: Waynetta Sandy, MD;  Location: [place];  Service: Vascular;  Laterality: Left;  . ANGIOPLASTY / STENTING FEMORAL Right 01/13/2013   superficial femoral artery x 2 (3 mm x 60 mm, 4 mm x 60 mm)  . AV FISTULA PLACEMENT Left 02/11/2016   Procedure: LEFT UPPER ARM BRACHIAL CEPHALIC ARTERIOVENOUS (AV) FISTULA CREATION;  Surgeon: Conrad , MD;  Location: [place];  Service: Vascular;  Laterality: Left;  . BIOPSY  03/29/2018   Procedure: BIOPSY;  Surgeon: Rush Landmark Telford Nab., MD;  Location: [place];  Service: Gastroenterology;;  . BIOPSY  05/31/2018   Procedure: BIOPSY;   Surgeon: Irving Copas., MD;  Location: [place];  Service: Gastroenterology;;  . COLONOSCOPY W/ POLYPECTOMY    . CORONARY ARTERY BYPASS GRAFT N/A 04/07/2013   Procedure: CORONARY ARTERY BYPASS GRAFTING (CABG);  Surgeon: Ivin Poot, MD;  Location: [place];  Service: Open Heart Surgery;  Laterality: N/A;  . ESOPHAGOGASTRODUODENOSCOPY (EGD) WITH PROPOFOL N/A 03/29/2018   Procedure: ESOPHAGOGASTRODUODENOSCOPY (EGD) WITH PROPOFOL;  Surgeon: Rush Landmark Telford Nab., MD;  Location: [place];  Service: Gastroenterology;  Laterality: N/A;  . ESOPHAGOGASTRODUODENOSCOPY (EGD) WITH PROPOFOL N/A 05/31/2018   Procedure: ESOPHAGOGASTRODUODENOSCOPY (EGD) WITH PROPOFOL;  Surgeon: Rush Landmark Telford Nab., MD;  Location: [place];  Service: Gastroenterology;  Laterality: N/A;  . EUS N/A 05/31/2018   Procedure: UPPER ENDOSCOPIC ULTRASOUND (EUS) RADIAL;  Surgeon: Rush Landmark Telford Nab., MD;  Location: [place];  Service: Gastroenterology;  Laterality: N/A;  . INTRAOPERATIVE TRANSESOPHAGEAL ECHOCARDIOGRAM N/A 04/07/2013  Procedure: INTRAOPERATIVE TRANSESOPHAGEAL ECHOCARDIOGRAM;  Surgeon: Ivin Poot, MD;  Location: [place];  Service: Open Heart Surgery;  Laterality: N/A;  . IR FLUORO GUIDE CV LINE RIGHT  11/04/2017  . IR REMOVAL TUN CV CATH W/O FL  11/27/2017  . IR US GUIDE VASC ACCESS RIGHT  11/04/2017  . LEFT HEART CATHETERIZATION WITH CORONARY ANGIOGRAM N/A 04/05/2013   Procedure: LEFT HEART CATHETERIZATION WITH CORONARY ANGIOGRAM;  Surgeon: Peter M Martinique, MD;  [facility] C[facility] LAB;  Service: Cardiovascular;  Laterality: N/A;  . LOWER EXTREMITY ANGIOGRAM Bilateral 01/06/2013   Procedure: LOWER EXTREMITY ANGIOGRAM;  Surgeon: Conrad Navesink, MD;  Location: [facility] CATH LAB;  Service: Cardiovascular;  Laterality: Bilateral;  . LOWER EXTREMITY ANGIOGRAM Right 01/13/2013   Procedure: LOWER EXTREMITY ANGIOGRAM;  Surgeon: Conrad [address], MD;  Location: [facility] CATH LAB;  Service: Cardiovascular;  Laterality:  Right;  . SKIN CANCER EXCISION  2017  . THROAT SURGERY  1983   polyps  . TONSILLECTOMY AND ADENOIDECTOMY  ~ 1965     A IV Location/Drains/Wounds Patient Lines/Drains/Airways Status   Active Line/Drains/Airways    Name:   Placement date:   Placement time:   Site:   Days:   Peripheral IV 07/20/18 Right Antecubital   07/20/18    1929    Antecubital   less than 1   Peripheral IV 07/20/18 Right Wrist   07/20/18    1935    Wrist   less than 1   Fistula / Graft Left Upper arm Arteriovenous vein graft   07/03/17    0000    Upper arm   382   Fistula / Graft Left Upper arm   -    -    Upper arm      Tunneled CVC Single Lumen (Radiology) 11/04/17 Right Internal jugular 24 cm   11/04/17    1009    Internal jugular   258          Intake/Output Last 24 hours No intake or output data in the 24 hours ending 07/20/18 2058  Labs/Imaging Results for orders placed or performed during the hospital encounter of 07/20/18 (from the past 48 hour(s))  CBC     Status: Abnormal   Collection Time: 07/20/18  5:48 PM  Result Value Ref Range   WBC 4.9 4.0 - 10.5 K/uL   RBC 1.80 (L) 4.22 - 5.81 MIL/uL   Hemoglobin 5.5 (LL) 13.0 - 17.0 g/dL    Comment: REPEATED TO VERIFY THIS CRITICAL RESULT HAS VERIFIED AND BEEN CALLED TO ZACH Temika Sutphin,RN BY MARVIN SCOTTON ON 06 16 2020 AT 1831, AND HAS BEEN READ BACK.     HCT 17.1 (L) 39.0 - 52.0 %   MCV 95.0 80.0 - 100.0 fL   MCH 30.6 26.0 - 34.0 pg   MCHC 32.2 30.0 - 36.0 g/dL   RDW 14.7 11.5 - 15.5 %   Platelets 184 150 - 400 K/uL   nRBC 0.0 0.0 - 0.2 %    Comment: Performed at [facility], [place] [address], [place], [place] [address]  Comprehensive metabolic panel     Status: Abnormal   Collection Time: 07/20/18  5:48 PM  Result Value Ref Range   Sodium 140 135 - 145 mmol/L   Potassium 3.7 3.5 - 5.1 mmol/L   Chloride 111 98 - 111 mmol/L   CO2 20 (L) 22 - 32 mmol/L   Glucose, Bld 286 (H) 70 - 99 mg/dL   BUN 61 (H) 8 -  23 mg/dL   Creatinine,  Ser 2.63 (H) 0.61 - 1.24 mg/dL   Calcium 8.0 (L) 8.9 - 10.3 mg/dL   Total Protein 5.9 (L) 6.5 - 8.1 g/dL   Albumin 3.1 (L) 3.5 - 5.0 g/dL   AST 13 (L) 15 - 41 U/L   ALT 11 0 - 44 U/L   Alkaline Phosphatase 81 38 - 126 U/L   Total Bilirubin 0.3 0.3 - 1.2 mg/dL   GFR calc non Af Amer 24 (L) >60 mL/min   GFR calc Af Amer 28 (L) >60 mL/min   Anion gap 9 5 - 15    Comment: Performed at [facility], [place] [address] [place], [place] [address]   *Note: Due to a large number of results and/or encounters for the requested time period, some results have not been displayed. A complete set of results can be found in Results Review.   No results found.  Pending Labs [employer] (From admission, onward)    Start     Ordered   07/20/18 1920  Prepare RBC  (Adult Blood Administration - Red Blood Cells)  Once,   R    Question Answer Comment  # of Units 2 units   Transfusion Indications Actively Bleeding / GI Bleed   If emergent release call blood bank Not emergent release   Instructions: Transfuse      07/20/18 1919   Signed and Held  Prepare RBC  (Adult Blood Administration - Red Blood Cells)  Once,   R    Question Answer Comment  # of Units 2 units   Transfusion Indications Symptomatic Anemia   If emergent release call blood bank [facility] [phone]      Signed and Held   Signed and Held  CBC WITH DIFFERENTIAL  Now then every 6 hours,   R     Signed and Held   Signed and Held  Comprehensive metabolic panel  Tomorrow morning,   R     Signed and Held          Vitals/Pain Today's Vitals   07/20/18 1744 07/20/18 1908 07/20/18 1930 07/20/18 2000  BP:  (!) 147/61 (!) 152/68 (!) 163/66  Pulse:  71 78 73  Resp:  12 (!) 21 15  Temp:      TempSrc:      SpO2:  99% 100% 100%  Weight: 78.2 kg     Height: 5\' 8"  (1.727 m)     PainSc: 0-No pain       Isolation Precautions No active isolations  Medications Medications  pantoprazole (PROTONIX) injection 40 mg  (has no administration in time range)  0.9 %  sodium chloride infusion (Manually program via Guardrails IV Fluids) (has no administration in time range)    Mobility non-ambulatory Low fall risk   Focused Assessments Cardiac Assessment Handoff:    Lab Results  Component Value Date   TROPONINI <0.03 06/23/2018   No results found for: DDIMER Does the Patient currently have chest pain? No  , Pulmonary Assessment Handoff:  Lung sounds:   O2 Device: Room Air        R Recommendations: See Admitting Provider Note  Report given to:   Additional Notes:  Using type and screen from previous admission.

## 2018-07-20 NOTE — ED Notes (Addendum)
Pt. Documented in error post transfusion-RN to place order with appropriate draw time.

## 2018-07-20 NOTE — ED Notes (Signed)
Bed: FQ42 Expected date:  Expected time:  Means of arrival:  Comments: EMS 68yo from facility low hgb 5.9

## 2018-07-20 NOTE — ED Notes (Signed)
Date and time results received: 07/20/18 1830  (use smartphrase ".now" to insert current time)  Test: HGB Critical Value: 5.5  Name of Provider Notified: Dr. Marsh Dolly  Orders Received? Or Actions Taken?: Orders Received - See Orders for details

## 2018-07-20 NOTE — ED Notes (Signed)
Consent signed.

## 2018-07-20 NOTE — ED Provider Notes (Signed)
[facility] DEPT Provider Note   CSN: [id number] Arrival date & time: 07/20/18  1734    History   Chief Complaint Chief Complaint  Patient presents with  . Abnormal labs    HPI Leonard [name] is a 68 y.o. male.     68 yo M with a chief complaints of low hemoglobin.  The patient was called at home and told his hemoglobin was 5.9.  He has been having an issue with recurrent low hemoglobin levels.  He is not sure exactly where the bleeding is coming from.  States he is not fatigued not weak no shortness of breath no chest pain.  Denies abdominal pain vomiting or diarrhea.  He denies dark stool or blood in stool.  The history is provided by the patient.  Illness Severity:  Moderate Onset quality:  Gradual Duration:  1 day Timing:  Constant Progression:  Unchanged Chronicity:  New Associated symptoms: no abdominal pain, no chest pain, no congestion, no diarrhea, no fever, no headaches, no myalgias, no rash, no shortness of breath and no vomiting     Past Medical History:  Diagnosis Date  . Acute on chronic diastolic heart failure ([place]) 07/04/2014  . Anemia of renal disease   . Atherosclerotic peripheral vascular disease with gangrene ([place]) 02/14/2017  . CAD (coronary artery disease) 04/06/2013  . Cancer ([place])    skin cancer-  . CHF (congestive heart failure) ([place])   . Chronic anemia 02/28/2016  . Chronic deep vein thrombosis (DVT) of femoral vein of left lower extremity ([place]) 09/03/2016  . Chronic respiratory failure with hypoxia ([place]) 08/25/2017  . CKD (chronic kidney disease) stage 4, GFR 15-29 ml/min (HCC) 01/15/2015   Follows with [facility].  Last visits:  07/23/16: No change in meds. Worried that if we increase diuretics could lead to another AKI  . CKD (chronic kidney disease), stage IV ([place])    07/07/2017  . Claudication ([place])   . Collapse of left talus 01/14/2017  . COPD GOLD III  02/16/2014   Quit smoking 2014  - PFT's  04/06/13   FEV1  1.28 (44 % ) ratio 48  p no % improvement from saba p ?  prior to study with DLCO  43/54 % corrects to 97  % for alv volume - 01/31/2016  After extensive coaching HFA effectiveness =    75% from baseline 0 > changed to sprivia respimat   - PFT's  03/17/2016  FEV1 2.25  (81 % ) ratio 88  p 2 % improvement from saba p spiriva respimat x 2  prior to study with  . Coronary artery disease   . Diabetes ([place])   . Diabetic polyneuropathy associated with type 2 diabetes mellitus ([place]) 01/14/2017  . Diabetic retinopathy ([place]) 11/13/2015   Per Dr. Erenest Rasher eye exam in 10/17/15, mild background diabetic retinopathy  . Fatigue associated with anemia 01/21/2016  . Gangrene of toe ([place])    right 5th/notes 01/04/2013   . HCAP (healthcare-associated pneumonia)   . Hematuria 06/27/2015  . High cholesterol   . History of tobacco use 08/23/2013   Smoked pack per day for 46 years Quit smoking - 02/03/2013   . Hypertension   . Hypothyroidism 11/22/2014  . Idiopathic chronic gout of left foot without tophus   . Iron deficiency anemia 08/03/2015  . Left foot pain 10/29/2016  . Long term (current) use of anticoagulants [Z79.01] 09/09/2016  . MGUS (monoclonal gammopathy of unknown significance) 01/15/2015  . Mixed hyperlipidemia  07/17/2008  . Morbid obesity due to excess calories ([place]) 03/17/2016  . PAD (peripheral artery disease) ([place])   . Pleural effusion   . Pneumonitis 07/26/2017  . Pulmonary artery hypertension ([place]) 07/25/2017  . PVD - hx of Rt SFA PTA and s/p Rt 4-5th toe amp Dec 2014 04/05/2013  . 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 Respiratory failure ([place]) 08/13/2017  . Respiratory failure with hypoxia ([place]) 09/23/2017  . 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 Secondary hypertension   . Shortness of breath dyspnea    with exertion  . Skin lesion of face 02/22/2015  . SOB (shortness of breath)   . Type 2 diabetes mellitus with pressure callus ([place]) 12/29/2016  . Type II diabetes mellitus (HCC)    Type II  . Type II diabetes  mellitus with complication [facility])     Patient Active Problem List   Diagnosis Date Noted  . Hematemesis/vomiting blood 07/15/2018  . Acute blood loss anemia 07/15/2018  . Chronic blood loss anemia   . Upper GI bleed 06/23/2018  . Acute renal failure superimposed on chronic kidney disease ([place])   . Abdominal pain   . DNR (do not resuscitate)   . 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 DNR (do not resuscitate) discussion   . Anemia 03/29/2018  . Acute GI bleeding 03/28/2018  . Dysphagia 10/01/2017  . Palliative care by specialist   . Severe comorbid illness   . Moderate malnutrition ([place]) 08/27/2017  . Hypoxemia   . Atelectasis   . Chronic respiratory failure with hypoxia ([place]) 08/25/2017  . Pulmonary artery hypertension ([place]) 07/25/2017  . Pressure injury of skin 07/20/2017  . Goals of care, counseling/discussion 02/11/2017  . Diabetic polyneuropathy associated with type 2 diabetes mellitus ([place]) 01/14/2017  . Pleural effusion   . Chronic anemia 02/28/2016  . Weakness generalized 01/21/2016  . Diabetic retinopathy ([place]) 11/13/2015  . Iron deficiency anemia 08/03/2015  . Type II diabetes mellitus with complication ([place])   . Chronic kidney disease (CKD), stage III (moderate) (HCC)   . MGUS (monoclonal gammopathy of unknown significance) 01/15/2015  . Deficiency anemia 01/15/2015  . CKD (chronic kidney disease) stage 4, GFR 15-29 ml/min (HCC) 01/15/2015  . Hypothyroidism 11/22/2014  . COPD GOLD III  02/16/2014  . Anemia of renal disease   . Chronic diastolic CHF (congestive heart failure) ([employer])   . History of tobacco use 08/23/2013  . 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 CAD (coronary artery disease) 04/06/2013  . PVD - hx of Rt SFA PTA and s/p Rt 4-5th toe amp Dec 2014 04/05/2013  . 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 Mixed hyperlipidemia 07/17/2008    Past Surgical History:  Procedure Laterality Date  . ABDOMINAL AORTOGRAM W/LOWER EXTREMITY N/A 03/12/2017   Procedure:  ABDOMINAL AORTOGRAM W/LOWER EXTREMITY;  Surgeon: Conrad Biggers, MD;  Location: [facility];  Service: Cardiovascular;  Laterality: N/A;  . AMPUTATION Right 01/28/2013   Procedure: RAY AMPUTATION RIGHT  4th & 5th TOE;  Surgeon: Rosetta Posner, MD;  Location: [facility];  Service: Vascular;  Laterality: Right;  . AMPUTATION Left 03/15/2017   Procedure: AMPUTATION ABOVE KNEE;  Surgeon: Waynetta Sandy, MD;  Location: [place];  Service: Vascular;  Laterality: Left;  . ANGIOPLASTY / STENTING FEMORAL Right 01/13/2013   superficial femoral artery x 2 (3 mm x 60 mm, 4 mm x 60 mm)  . AV FISTULA PLACEMENT Left 02/11/2016   Procedure: LEFT UPPER ARM BRACHIAL CEPHALIC ARTERIOVENOUS (  AV) FISTULA CREATION;  Surgeon: Conrad Luis Llorens Torres, MD;  Location: [facility];  Service: Vascular;  Laterality: Left;  . BIOPSY  03/29/2018   Procedure: BIOPSY;  Surgeon: Rush Landmark Telford Nab., MD;  Location: [facility];  Service: Gastroenterology;;  . BIOPSY  05/31/2018   Procedure: BIOPSY;  Surgeon: Irving Copas., MD;  Location: [place];  Service: Gastroenterology;;  . COLONOSCOPY W/ POLYPECTOMY    . CORONARY ARTERY BYPASS GRAFT N/A 04/07/2013   Procedure: CORONARY ARTERY BYPASS GRAFTING (CABG);  Surgeon: Ivin Poot, MD;  Location: [facility];  Service: Open Heart Surgery;  Laterality: N/A;  . ESOPHAGOGASTRODUODENOSCOPY (EGD) WITH PROPOFOL N/A 03/29/2018   Procedure: ESOPHAGOGASTRODUODENOSCOPY (EGD) WITH PROPOFOL;  Surgeon: Rush Landmark Telford Nab., MD;  Location: [place];  Service: Gastroenterology;  Laterality: N/A;  . ESOPHAGOGASTRODUODENOSCOPY (EGD) WITH PROPOFOL N/A 05/31/2018   Procedure: ESOPHAGOGASTRODUODENOSCOPY (EGD) WITH PROPOFOL;  Surgeon: Rush Landmark Telford Nab., MD;  Location: [place];  Service: Gastroenterology;  Laterality: N/A;  . EUS N/A 05/31/2018   Procedure: UPPER ENDOSCOPIC ULTRASOUND (EUS) RADIAL;  Surgeon: Rush Landmark Telford Nab., MD;  Location: [place];  Service: Gastroenterology;   Laterality: N/A;  . INTRAOPERATIVE TRANSESOPHAGEAL ECHOCARDIOGRAM N/A 04/07/2013   Procedure: INTRAOPERATIVE TRANSESOPHAGEAL ECHOCARDIOGRAM;  Surgeon: Ivin Poot, MD;  Location: [place];  Service: Open Heart Surgery;  Laterality: N/A;  . IR FLUORO GUIDE CV LINE RIGHT  11/04/2017  . IR REMOVAL TUN CV CATH W/O FL  11/27/2017  . IR US GUIDE VASC ACCESS RIGHT  11/04/2017  . LEFT HEART CATHETERIZATION WITH CORONARY ANGIOGRAM N/A 04/05/2013   Procedure: LEFT HEART CATHETERIZATION WITH CORONARY ANGIOGRAM;  Surgeon: Peter M Martinique, MD;  Location: [facility] CATH LAB;  Service: Cardiovascular;  Laterality: N/A;  . LOWER EXTREMITY ANGIOGRAM Bilateral 01/06/2013   Procedure: LOWER EXTREMITY ANGIOGRAM;  Surgeon: Conrad [place], MD;  Location: [facility] C[facility] LAB;  Service: Cardiovascular;  Laterality: Bilateral;  . LOWER EXTREMITY ANGIOGRAM Right 01/13/2013   Procedure: LOWER EXTREMITY ANGIOGRAM;  Surgeon: Conrad Sabinal, MD;  Location: [facility] CATH LAB;  Service: Cardiovascular;  Laterality: Right;  . SKIN CANCER EXCISION  2017  . THROAT SURGERY  1983   polyps  . TONSILLECTOMY AND ADENOIDECTOMY  ~ 1965        Home Medications    Prior to Admission medications   Medication Sig Start Date End Date Taking? Authorizing Provider  amLODipine (NORVASC) 5 MG tablet Take 1 tablet (5 mg total) by mouth daily. 07/12/18   Caroline More, DO  atorvastatin (LIPITOR) 40 MG tablet TAKE ONE (1) TABLET BY MOUTH EVERY DAY AT 6:00PM Patient taking differently: Take 40 mg by mouth daily at 6 PM.  11/17/16   Larey Dresser, MD  Blood Glucose Monitoring Suppl ([place]) w/Device KIT 1 kit by Does not apply route daily. Patient not taking: Reported on 07/15/2018 10/10/16   McDiarmid, Blane Ohara, MD  carvedilol (COREG) 12.5 MG tablet Take 1 tablet (12.5 mg total) by mouth 2 (two) times daily with a meal. 08/30/17   Meccariello, Bernita Raisin, DO  cholecalciferol (VITAMIN D) 25 MCG (1000 UT) tablet Take 2,000 Units by mouth 3 (three) times  daily.    [provider]  cloNIDine (CATAPRES - DOSED IN MG/24 HR) 0.2 mg/24hr patch Place 1 patch (0.2 mg total) onto the skin once a week. Patient not taking: Reported on 07/15/2018 08/09/17   Steve Rattler, DO  Darbepoetin Alfa (ARANESP) 40 MCG/0.4ML SOSY injection Inject 0.4 mLs (40 mcg total) into the skin  every 30 (thirty) days. 09/28/17   Meccariello, Bernita Raisin, DO  doxazosin (CARDURA) 8 MG tablet Take 1 tablet (8 mg total) by mouth every evening. Patient taking differently: Take 8 mg by mouth daily.  12/01/16   Zenia Resides, MD  feeding supplement, ENSURE ENLIVE, (ENSURE ENLIVE) LIQD Take 237 mLs by mouth 3 (three) times daily between meals. 08/07/17   Steve Rattler, DO  ferrous sulfate (FEROSUL) 325 (65 FE) MG tablet Take 1 tablet (325 mg total) by mouth daily at 12 noon. Patient taking differently: Take 325 mg by mouth daily.  08/31/17   Meccariello, Bernita Raisin, DO  glucose blood (ONETOUCH VERIO) test strip Use as instructed to test three times daily. ICD-10 code: E11.22 Patient not taking: Reported on 07/15/2018 10/15/16   McDiarmid, Blane Ohara, MD  Glycopyrrolate [facility] MAGNAIR STARTER KIT) 25 MCG/ML SOLN Inhale 1 mL into the lungs 2 (two) times a day.    [provider]  hydrALAZINE (APRESOLINE) 100 MG tablet TAKE ONE TABLET BY MOUTH EVERY EIGHT HOURS Patient taking differently: Take 100 mg by mouth every 8 (eight) hours.  11/14/16   [name], MD  isosorbide mononitrate (IMDUR) 120 MG 24 hr tablet Take 1 tablet (120 mg total) by mouth daily. 10/02/17   Anderson, Chelsey L, DO  levothyroxine (SYNTHROID, LEVOTHROID) 100 MCG tablet Take 100 mcg by mouth daily before breakfast.     [provider]  nicotine (NICODERM CQ - DOSED IN MG/24 HOURS) 14 mg/24hr patch Place 14 mg onto the skin daily.    [provider]  nitroGLYCERIN (NITROSTAT) 0.4 MG SL tablet Place 1 tablet (0.4 mg total) under the tongue every 5 (five) minutes as needed for chest  pain. 11/14/16   [name], MD  [facility] DELICA LANCETS 27C MISC 1 each by Does not apply route 3 (three) times daily. ICD-10 code: E11.22 Patient not taking: Reported on 07/15/2018 10/15/16   McDiarmid, Blane Ohara, MD  pantoprazole (PROTONIX) 40 MG tablet Take 1 tablet (40 mg total) by mouth 2 (two) times daily. 04/03/18   Patriciaann Clan, DO  polyethylene glycol (MIRALAX / GLYCOLAX) packet Take 17 g by mouth daily as needed for mild constipation or moderate constipation. Patient not taking: Reported on 07/15/2018 08/07/17   Steve Rattler, DO  torsemide (DEMADEX) 20 MG tablet Take 2 tablets (40 mg total) by mouth daily. 02/16/18   Larey Dresser, MD  umeclidinium bromide (INCRUSE ELLIPTA) 62.5 MCG/INH AEPB Inhale 1 puff into the lungs daily. 06/28/18   Mullis, Archie Endo, DO    Family History Family History  Problem Relation Age of Onset  . Cancer Mother        lung cancer  . Hypertension Mother   . Cancer Sister        patient thinks it was uterine cancer  . Hypertension Sister   . Heart attack Sister   . Stroke Neg Hx     Social History Social History   Tobacco Use  . Smoking status: Former Smoker    Packs/day: 1.00    Years: 46.00    Pack years: 46.00    Types: Cigarettes    Start date: 02/04/1967    Quit date: 02/03/2013    Years since quitting: 5.4  . Smokeless tobacco: Never Used  Substance Use Topics  . Alcohol use: No    Alcohol/week: 0.0 standard drinks  . Drug use: No     Allergies   Penicillins   Review  of Systems Review of Systems  Constitutional: Negative for chills and fever.  HENT: Negative for congestion and facial swelling.   Eyes: Negative for discharge and visual disturbance.  Respiratory: Negative for shortness of breath.   Cardiovascular: Negative for chest pain and palpitations.  Gastrointestinal: Negative for abdominal pain, diarrhea and vomiting.  Musculoskeletal: Negative for arthralgias and myalgias.  Skin: Negative for color change  and rash.  Neurological: Negative for tremors, syncope and headaches.  Psychiatric/Behavioral: Negative for confusion and dysphoric mood.     Physical Exam Updated Vital Signs BP (!) 147/61   Pulse 71   Temp 99.3 F (37.4 C) (Oral)   Resp 12   Ht _0  (1.727 m)   Wt 78.2 kg   SpO2 99%   BMI 26.23 kg/m   Physical Exam Vitals signs and nursing note reviewed.  Constitutional:      Appearance: He is well-developed.     Comments: Pallor  HENT:     Head: Normocephalic and atraumatic.  Eyes:     Pupils: Pupils are equal, round, and reactive to light.  Neck:     Musculoskeletal: Normal range of motion and neck supple.     Vascular: No JVD.  Cardiovascular:     Rate and Rhythm: Normal rate and regular rhythm.     Heart sounds: No murmur. No friction rub. No gallop.   Pulmonary:     Effort: No respiratory distress.     Breath sounds: No wheezing.  Abdominal:     General: There is no distension.     Tenderness: There is no guarding or rebound.  Musculoskeletal: Normal range of motion.  Skin:    Coloration: Skin is not pale.     Findings: No rash.  Neurological:     Mental Status: He is alert and oriented to person, place, and time.  Psychiatric:        Behavior: Behavior normal.      ED Treatments / Results  Labs (all labs ordered are listed, but only abnormal results are displayed) Labs Reviewed  CBC - Abnormal; Notable for the following components:      Result Value   RBC 1.80 (*)    Hemoglobin 5.5 (*)    HCT 17.1 (*)    All other components within normal limits  COMPREHENSIVE METABOLIC PANEL - Abnormal; Notable for the following components:   CO2 20 (*)    Glucose, Bld 286 (*)    BUN 61 (*)    Creatinine, Ser 2.63 (*)    Calcium 8.0 (*)    Total Protein 5.9 (*)    Albumin 3.1 (*)    AST 13 (*)    GFR calc non Af Amer 24 (*)    GFR calc Af Amer 28 (*)    All other components within normal limits  PREPARE RBC (CROSSMATCH)    EKG None  Radiology  No results found.  Procedures Procedures (including critical care time)  Medications Ordered in ED Medications  pantoprazole (PROTONIX) injection 40 mg (has no administration in time range)  0.9 %  sodium chloride infusion (Manually program via Guardrails IV Fluids) (has no administration in time range)     Initial Impression / Assessment and Plan / ED Course  I have reviewed the triage vital signs and the nursing notes.  Pertinent labs & imaging results that were available during my care of the patient were reviewed by me and considered in my medical decision making (see chart for details).  68 yo M with a chief complaint of low hemoglobin level in the office.  Patient is asymptomatic.  Has been having issues with recurrent anemia.  Has a history of gastric cancer.   Hemoglobin returned at 5.5.  Renal function mildly better than baseline.  Give 2 units of blood.  Discussed with GI and hospitalist.  I discussed the case with Dr. Hilarie Fredrickson will place on the list for them to see in the morning.  CRITICAL CARE Performed by: Cecilio Asper   Total critical care time: 35 minutes  Critical care time was exclusive of separately billable procedures and treating other patients.  Critical care was necessary to treat or prevent imminent or life-threatening deterioration.  Critical care was time spent personally by me on the following activities: development of treatment plan with patient and/or surrogate as well as nursing, discussions with consultants, evaluation of patient's response to treatment, examination of patient, obtaining history from patient or surrogate, ordering and performing treatments and interventions, ordering and review of laboratory studies, ordering and review of radiographic studies, pulse oximetry and re-evaluation of patient's condition.  The patients results and plan were reviewed and discussed.   Any x-rays performed were independently reviewed by  myself.   Differential diagnosis were considered with the presenting HPI.  Medications  pantoprazole (PROTONIX) injection 40 mg (has no administration in time range)  0.9 %  sodium chloride infusion (Manually program via Guardrails IV Fluids) (has no administration in time range)    Vitals:   07/20/18 1739 07/20/18 1741 07/20/18 1744 07/20/18 1908  BP:  (!) 168/69  (!) 147/61  Pulse:  75  71  Resp:  18  12  Temp:  99.3 F (37.4 C)    TempSrc:  Oral    SpO2: 100% 100%  99%  Weight:   78.2 kg   Height:   _0  (1.727 m)     Final diagnoses:  Upper GI bleed    Admission/ observation were discussed with the admitting physician, patient and/or family and they are comfortable with the plan.   Final Clinical Impressions(s) / ED Diagnoses   Final diagnoses:  Upper GI bleed    ED Discharge Orders    None       Deno Etienne, DO 07/20/18 1937

## 2018-07-20 NOTE — H&P (Signed)
History and Physical   Leonard [name] LHT:[id number] DOB: 07-Apr-1950 DOA: 07/20/2018  Referring MD/NP/PA: Dr. Tyrone Nine  PCP: Kathrene Alu, MD   Outpatient Specialists: Kyung Rudd, MD oncology  Patient coming from: Home  Chief Complaint: Abnormal labs  HPI: Leonard [name] is a 68 y.o. male with medical history significant of cancer of the stomach, diabetes, coronary artery disease, peripheral vascular disease status post left AKA, chronic anemia, chronic kidney disease stage IV, COPD and hyperlipidemia with ongoing tobacco abuse who is undergoing radiation therapy now.  Patient was called at home and told that his hemoglobin is down to 5.9.  He has been feeling weak.  He denied any shortness of breath.  Occasional exertional dyspnea.  He was seen in the ER and hemoglobin is down to 5.5.  Patient has guaiac positive stool but not dark.  Denied any bright red blood per rectum.  Suspected slow GI bleed from his gastric cancer and is being admitted for treatment..  ED Course: Temperature is 99.3, blood pressure is 168/69, pulse 82, respiratory 21 and oxygen sat 99% room air.  White count is 4.8 hemoglobin 5.5 and platelets 179.  Sodium 140 potassium 3.7 chloride 111 CO2 20 BUN 61 creatinine 2.63 and calcium 8.0.  Glucose 286.  Patient being admitted for transfusion and other evaluation  Review of Systems: As per HPI otherwise 10 point review of systems negative.    Past Medical History:  Diagnosis Date  . Acute on chronic diastolic heart failure ([place]) 07/04/2014  . Anemia of renal disease   . Atherosclerotic peripheral vascular disease with gangrene ([place]) 02/14/2017  . CAD (coronary artery disease) 04/06/2013  . Cancer ([place])    skin cancer-  . CHF (congestive heart failure) ([place])   . Chronic anemia 02/28/2016  . Chronic deep vein thrombosis (DVT) of femoral vein of left lower extremity ([place]) 09/03/2016  . Chronic respiratory failure with hypoxia ([place]) 08/25/2017  . CKD (chronic kidney  disease) stage 4, GFR 15-29 ml/min (HCC) 01/15/2015   Follows with [facility].  Last visits:  07/23/16: No change in meds. Worried that if we increase diuretics could lead to another AKI  . CKD (chronic kidney disease), stage IV ([place])    07/07/2017  . Claudication ([place])   . Collapse of left talus 01/14/2017  . COPD GOLD III  02/16/2014   Quit smoking 2014  - PFT's  04/06/13   FEV1 1.28 (44 % ) ratio 48  p no % improvement from saba p ?  prior to study with DLCO  43/54 % corrects to 97  % for alv volume - 01/31/2016  After extensive coaching HFA effectiveness =    75% from baseline 0 > changed to sprivia respimat   - PFT's  03/17/2016  FEV1 2.25  (81 % ) ratio 88  p 2 % improvement from saba p spiriva respimat x 2  prior to study with  . Coronary artery disease   . Diabetes ([place])   . Diabetic polyneuropathy associated with type 2 diabetes mellitus ([place]) 01/14/2017  . Diabetic retinopathy ([place]) 11/13/2015   Per Dr. Erenest Rasher eye exam in 10/17/15, mild background diabetic retinopathy  . Fatigue associated with anemia 01/21/2016  . Gangrene of toe ([place])    right 5th/notes 01/04/2013   . HCAP (healthcare-associated pneumonia)   . Hematuria 06/27/2015  . High cholesterol   . History of tobacco use 08/23/2013   Smoked pack per day for 46 years Quit smoking - 02/03/2013   .  Hypertension   . Hypothyroidism 11/22/2014  . Idiopathic chronic gout of left foot without tophus   . Iron deficiency anemia 08/03/2015  . Left foot pain 10/29/2016  . Long term (current) use of anticoagulants [Z79.01] 09/09/2016  . MGUS (monoclonal gammopathy of unknown significance) 01/15/2015  . Mixed hyperlipidemia 07/17/2008  . Morbid obesity due to excess calories ([place]) 03/17/2016  . PAD (peripheral artery disease) ([place])   . Pleural effusion   . Pneumonitis 07/26/2017  . Pulmonary artery hypertension ([place]) 07/25/2017  . PVD - hx of Rt SFA PTA and s/p Rt 4-5th toe amp Dec 2014 04/05/2013  . 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  Respiratory failure ([place]) 08/13/2017  . Respiratory failure with hypoxia ([place]) 09/23/2017  . 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 Secondary hypertension   . Shortness of breath dyspnea    with exertion  . Skin lesion of face 02/22/2015  . SOB (shortness of breath)   . Type 2 diabetes mellitus with pressure callus ([place]) 12/29/2016  . Type II diabetes mellitus (HCC)    Type II  . Type II diabetes mellitus with complication [facility])     Past Surgical History:  Procedure Laterality Date  . ABDOMINAL AORTOGRAM W/LOWER EXTREMITY N/A 03/12/2017   Procedure: ABDOMINAL AORTOGRAM W/LOWER EXTREMITY;  Surgeon: Conrad Harlowton, MD;  Location: [place];  Service: Cardiovascular;  Laterality: N/A;  . AMPUTATION Right 01/28/2013   Procedure: RAY AMPUTATION RIGHT  4th & 5th TOE;  Surgeon: Rosetta Posner, MD;  Location: [place];  Service: Vascular;  Laterality: Right;  . AMPUTATION Left 03/15/2017   Procedure: AMPUTATION ABOVE KNEE;  Surgeon: Waynetta Sandy, MD;  Location: [place];  Service: Vascular;  Laterality: Left;  . ANGIOPLASTY / STENTING FEMORAL Right 01/13/2013   superficial femoral artery x 2 (3 mm x 60 mm, 4 mm x 60 mm)  . AV FISTULA PLACEMENT Left 02/11/2016   Procedure: LEFT UPPER ARM BRACHIAL CEPHALIC ARTERIOVENOUS (AV) FISTULA CREATION;  Surgeon: Conrad Clarks Green, MD;  Location: [place];  Service: Vascular;  Laterality: Left;  . BIOPSY  03/29/2018   Procedure: BIOPSY;  Surgeon: Rush Landmark Telford Nab., MD;  Location: [place];  Service: Gastroenterology;;  . BIOPSY  05/31/2018   Procedure: BIOPSY;  Surgeon: Irving Copas., MD;  Location: [place];  Service: Gastroenterology;;  . COLONOSCOPY W/ POLYPECTOMY    . CORONARY ARTERY BYPASS GRAFT N/A 04/07/2013   Procedure: CORONARY ARTERY BYPASS GRAFTING (CABG);  Surgeon: Ivin Poot, MD;  Location: [place];  Service: Open Heart Surgery;  Laterality: N/A;  . ESOPHAGOGASTRODUODENOSCOPY (EGD) WITH PROPOFOL N/A 03/29/2018   Procedure:  ESOPHAGOGASTRODUODENOSCOPY (EGD) WITH PROPOFOL;  Surgeon: Rush Landmark Telford Nab., MD;  Location: [place];  Service: Gastroenterology;  Laterality: N/A;  . ESOPHAGOGASTRODUODENOSCOPY (EGD) WITH PROPOFOL N/A 05/31/2018   Procedure: ESOPHAGOGASTRODUODENOSCOPY (EGD) WITH PROPOFOL;  Surgeon: Rush Landmark Telford Nab., MD;  Location: [place];  Service: Gastroenterology;  Laterality: N/A;  . EUS N/A 05/31/2018   Procedure: UPPER ENDOSCOPIC ULTRASOUND (EUS) RADIAL;  Surgeon: Rush Landmark Telford Nab., MD;  Location: [place];  Service: Gastroenterology;  Laterality: N/A;  . INTRAOPERATIVE TRANSESOPHAGEAL ECHOCARDIOGRAM N/A 04/07/2013   Procedure: INTRAOPERATIVE TRANSESOPHAGEAL ECHOCARDIOGRAM;  Surgeon: Ivin Poot, MD;  Location: [place];  Service: Open Heart Surgery;  Laterality: N/A;  . IR FLUORO GUIDE CV LINE RIGHT  11/04/2017  . IR REMOVAL TUN CV CATH W/O FL  11/27/2017  . IR US GUIDE VASC ACCESS RIGHT  11/04/2017  . LEFT HEART  CATHETERIZATION WITH CORONARY ANGIOGRAM N/A 04/05/2013   Procedure: LEFT HEART CATHETERIZATION WITH CORONARY ANGIOGRAM;  Surgeon: Peter M Martinique, MD;  Location: [facility] CATH LAB;  Service: Cardiovascular;  Laterality: N/A;  . LOWER EXTREMITY ANGIOGRAM Bilateral 01/06/2013   Procedure: LOWER EXTREMITY ANGIOGRAM;  Surgeon: Conrad [place], MD;  Location: [facility] CATH LAB;  Service: Cardiovascular;  Laterality: Bilateral;  . LOWER EXTREMITY ANGIOGRAM Right 01/13/2013   Procedure: LOWER EXTREMITY ANGIOGRAM;  Surgeon: Conrad Startex, MD;  Location: [facility] CATH LAB;  Service: Cardiovascular;  Laterality: Right;  . SKIN CANCER EXCISION  2017  . THROAT SURGERY  1983   polyps  . TONSILLECTOMY AND ADENOIDECTOMY  ~ 1965     reports that he quit smoking about 5 years ago. His smoking use included cigarettes. He started smoking about 51 years ago. He has a 46.00 pack-year smoking history. He has never used smokeless tobacco. He reports that he does not drink alcohol or use drugs.  Allergies   Allergen Reactions  . Penicillins Other (See Comments)    Has patient had a PCN reaction causing immediate rash, facial/tongue/throat swelling, SOB or lightheadedness with hypotension: Unknown Has patient had a PCN reaction causing severe rash involving mucus membranes or skin necrosis: Unknown Has patient had a PCN reaction that required hospitalization: Unknown Has patient had a PCN reaction occurring within the last 10 years: Unknown If all of the above answers are "NO", then may proceed with Cephalosporin use.     Family History  Problem Relation Age of Onset  . Cancer Mother        lung cancer  . Hypertension Mother   . Cancer Sister        patient thinks it was uterine cancer  . Hypertension Sister   . Heart attack Sister   . Stroke Neg Hx      Prior to Admission medications   Medication Sig Start Date End Date Taking? Authorizing Provider  amLODipine (NORVASC) 5 MG tablet Take 1 tablet (5 mg total) by mouth daily. 07/12/18   Caroline More, DO  atorvastatin (LIPITOR) 40 MG tablet TAKE ONE (1) TABLET BY MOUTH EVERY DAY AT 6:00PM Patient taking differently: Take 40 mg by mouth daily at 6 PM.  11/17/16   Larey Dresser, MD  Blood Glucose Monitoring Suppl ([place]) w/Device KIT 1 kit by Does not apply route daily. Patient not taking: Reported on 07/15/2018 10/10/16   McDiarmid, Blane Ohara, MD  carvedilol (COREG) 12.5 MG tablet Take 1 tablet (12.5 mg total) by mouth 2 (two) times daily with a meal. 08/30/17   Meccariello, Bernita Raisin, DO  cholecalciferol (VITAMIN D) 25 MCG (1000 UT) tablet Take 2,000 Units by mouth 3 (three) times daily.    [provider]  cloNIDine (CATAPRES - DOSED IN MG/24 HR) 0.2 mg/24hr patch Place 1 patch (0.2 mg total) onto the skin once a week. Patient not taking: Reported on 07/15/2018 08/09/17   Steve Rattler, DO  Darbepoetin Alfa (ARANESP) 40 MCG/0.4ML SOSY injection Inject 0.4 mLs (40 mcg total) into the skin every 30 (thirty) days.  09/28/17   Meccariello, Bernita Raisin, DO  doxazosin (CARDURA) 8 MG tablet Take 1 tablet (8 mg total) by mouth every evening. Patient taking differently: Take 8 mg by mouth daily.  12/01/16   Zenia Resides, MD  feeding supplement, ENSURE ENLIVE, (ENSURE ENLIVE) LIQD Take 237 mLs by mouth 3 (three) times daily between meals. 08/07/17   Steve Rattler, DO  ferrous  sulfate (FEROSUL) 325 (65 FE) MG tablet Take 1 tablet (325 mg total) by mouth daily at 12 noon. Patient taking differently: Take 325 mg by mouth daily.  08/31/17   Meccariello, Bernita Raisin, DO  glucose blood (ONETOUCH VERIO) test strip Use as instructed to test three times daily. ICD-10 code: E11.22 Patient not taking: Reported on 07/15/2018 10/15/16   McDiarmid, Blane Ohara, MD  Glycopyrrolate [facility] MAGNAIR STARTER KIT) 25 MCG/ML SOLN Inhale 1 mL into the lungs 2 (two) times a day.    [provider]  hydrALAZINE (APRESOLINE) 100 MG tablet TAKE ONE TABLET BY MOUTH EVERY EIGHT HOURS Patient taking differently: Take 100 mg by mouth every 8 (eight) hours.  11/14/16   [name], MD  isosorbide mononitrate (IMDUR) 120 MG 24 hr tablet Take 1 tablet (120 mg total) by mouth daily. 10/02/17   Anderson, Chelsey L, DO  levothyroxine (SYNTHROID, LEVOTHROID) 100 MCG tablet Take 100 mcg by mouth daily before breakfast.     [provider]  nicotine (NICODERM CQ - DOSED IN MG/24 HOURS) 14 mg/24hr patch Place 14 mg onto the skin daily.    [provider]  nitroGLYCERIN (NITROSTAT) 0.4 MG SL tablet Place 1 tablet (0.4 mg total) under the tongue every 5 (five) minutes as needed for chest pain. 11/14/16   [name], MD  [facility] DELICA LANCETS 09G MISC 1 each by Does not apply route 3 (three) times daily. ICD-10 code: E11.22 Patient not taking: Reported on 07/15/2018 10/15/16   McDiarmid, Blane Ohara, MD  pantoprazole (PROTONIX) 40 MG tablet Take 1 tablet (40 mg total) by mouth 2 (two) times daily. 04/03/18   Patriciaann Clan,  DO  polyethylene glycol (MIRALAX / GLYCOLAX) packet Take 17 g by mouth daily as needed for mild constipation or moderate constipation. Patient not taking: Reported on 07/15/2018 08/07/17   Steve Rattler, DO  torsemide (DEMADEX) 20 MG tablet Take 2 tablets (40 mg total) by mouth daily. 02/16/18   Larey Dresser, MD  umeclidinium bromide (INCRUSE ELLIPTA) 62.5 MCG/INH AEPB Inhale 1 puff into the lungs daily. 06/28/18   Danna Hefty, DO    Physical Exam: Vitals:   07/20/18 2000 07/20/18 2030 07/20/18 2126 07/20/18 2256  BP: (!) 163/66 (!) 160/63 (!) 145/66 (!) 145/54  Pulse: 73 72 82 71  Resp: _0 Temp:   98.3 F (36.8 C) 98.8 F (37.1 C)  TempSrc:   Oral Oral  SpO2: 100% 99% 100% 100%  Weight:      Height:          Constitutional: NAD, calm, comfortable Vitals:   07/20/18 2000 07/20/18 2030 07/20/18 2126 07/20/18 2256  BP: (!) 163/66 (!) 160/63 (!) 145/66 (!) 145/54  Pulse: 73 72 82 71  Resp: _1 Temp:   98.3 F (36.8 C) 98.8 F (37.1 C)  TempSrc:   Oral Oral  SpO2: 100% 99% 100% 100%  Weight:      Height:       Eyes: PERRL, lids and conjunctivae pale ENMT: Mucous membranes are moist. Posterior pharynx clear of any exudate or lesions.Normal dentition.  Neck: normal, supple, no masses, no thyromegaly Respiratory: clear to auscultation bilaterally, no wheezing, no crackles. Normal respiratory effort. No accessory muscle use.  Cardiovascular: Regular rate and rhythm, no murmurs / rubs / gallops. No extremity edema. 2+ pedal pulses. No carotid bruits.  Abdomen: Mild tenderness, no masses palpated. No hepatosplenomegaly. Bowel sounds positive.  Musculoskeletal: no clubbing / cyanosis.  Left AKA, good ROM, no contractures. Normal muscle tone.  Skin: no rashes, lesions, ulcers. No induration Neurologic: CN 2-12 grossly intact. Sensation intact, DTR normal. Strength 5/5 in all 4.  Psychiatric: Normal judgment and insight. Alert and oriented x 3. Normal  mood.     Labs on Admission: I have personally reviewed following labs and imaging studies  CBC: Recent Labs  Lab 07/15/18 1455 07/16/18 0727 07/16/18 1054 07/16/18 1734 07/20/18 1748 07/20/18 2140  WBC 7.2 6.3  --   --  4.9 4.8  NEUTROABS 5.9  --   --   --   --  3.4  HGB 5.7* 7.0*  7.0* 7.3* 6.6* 5.5* 6.0*  HCT 18.1* 21.4*  22.0* 22.8* 20.8* 17.1* 19.2*  MCV 93.3 92.6  --   --  95.0 96.5  PLT 193 191  --   --  184 494   Basic Metabolic Panel: Recent Labs  Lab 07/15/18 1455 07/16/18 0727 07/20/18 1748  NA 139 143 140  K 5.1 4.0 3.7  CL 107 111 111  CO2 21* 21* 20*  GLUCOSE 272* 160* 286*  BUN 111* 103* 61*  CREATININE 3.26* 3.16* 2.63*  CALCIUM 8.1* 8.2* 8.0*   GFR: Estimated Creatinine Clearance: 26.4 mL/min (A) (by C-G formula based on SCr of 2.63 mg/dL (H)). Liver Function Tests: Recent Labs  Lab 07/15/18 1455 07/16/18 0727 07/20/18 1748  AST 9* 9* 13*  ALT _0 ALKPHOS 72 71 81  BILITOT 0.4 0.3 0.3  PROT 5.8* 5.7* 5.9*  ALBUMIN 2.9* 2.9* 3.1*   Recent Labs  Lab 07/15/18 1455  LIPASE 45   No results for input(s): AMMONIA in the last 168 hours. Coagulation Profile: No results for input(s): INR, PROTIME in the last 168 hours. Cardiac Enzymes: No results for input(s): CKTOTAL, CKMB, CKMBINDEX, TROPONINI in the last 168 hours. BNP (last 3 results) No results for input(s): PROBNP in the last 8760 hours. HbA1C: No results for input(s): HGBA1C in the last 72 hours. CBG: Recent Labs  Lab 07/16/18 0032 07/16/18 0413 07/16/18 0737 07/16/18 1200 07/20/18 2129  GLUCAP 92 145* 135* 215* 211*   Lipid Profile: No results for input(s): CHOL, HDL, LDLCALC, TRIG, CHOLHDL, LDLDIRECT in the last 72 hours. Thyroid Function Tests: No results for input(s): TSH, T4TOTAL, FREET4, T3FREE, THYROIDAB in the last 72 hours. Anemia Panel: No results for input(s): VITAMINB12, FOLATE, FERRITIN, TIBC, IRON, RETICCTPCT in the last 72 hours. Urine analysis:     Component Value Date/Time   COLORURINE YELLOW 06/24/2018 0628   APPEARANCEUR CLOUDY (A) 06/24/2018 0628   LABSPEC 1.012 06/24/2018 0628   PHURINE 7.0 06/24/2018 0628   GLUCOSEU NEGATIVE 06/24/2018 0628   HGBUR NEGATIVE 06/24/2018 0628   BILIRUBINUR NEGATIVE 06/24/2018 0628   BILIRUBINUR NEG 06/26/2015 1540   KETONESUR NEGATIVE 06/24/2018 0628   PROTEINUR 100 (A) 06/24/2018 0628   UROBILINOGEN 1.0 06/26/2015 1540   UROBILINOGEN 0.2 2020/07/1813 2156   NITRITE NEGATIVE 06/24/2018 0628   LEUKOCYTESUR LARGE (A) 06/24/2018 0628   Sepsis Labs: _1 (procalcitonin:4,lacticidven:4) ) Recent Results (from the past 240 hour(s))  Novel Coronavirus,NAA,(SEND-OUT TO REF LAB - TAT 24-48 hrs); Hosp Order     Status: None   Collection Time: 07/15/18  4:48 PM   Specimen: Nasopharyngeal Swab; Respiratory  Result Value Ref Range Status   SARS-CoV-2, NAA NOT DETECTED NOT DETECTED Final    Comment: (NOTE) Testing was performed using the cobas(R) SARS-CoV-2 test. This test was developed and its  performance characteristics determined by [employer]. This test has not been FDA cleared or approved. This test has been authorized by FDA under an Emergency Use Authorization (EUA). This test is only authorized for the duration of time the declaration that circumstances exist justifying the authorization of the emergency use of in vitro diagnostic tests for detection of SARS-CoV-2 virus and/or diagnosis of COVID-19 infection under section 564(b)(1) of the Act, 21 U.S.C. 409WJX-9(J)(4), unless the authorization is terminated or revoked sooner. When diagnostic testing is negative, the possibility of a false negative result should be considered in the context of a patient's recent exposures and the presence of clinical signs and symptoms consistent with COVID-19. An individual without symptoms of COVID-19 and who is not shedding SARS-CoV-2 virus would expect to have  a negative (not detected)  result in this assay. Performed At: [facility] [place], [place] [id number] Rush Farmer MD YQ:[id number]    [place]  Final    Comment: Performed at [facility] [address], [place], [place] [address]     Radiological Exams on Admission: No results found.    Assessment/Plan Principal Problem:   Symptomatic anemia Active Problems:   CAD (coronary artery disease)   Anemia of renal disease   COPD GOLD III    CKD (chronic kidney disease) stage 4, GFR 15-29 ml/min (HCC)   Type II diabetes mellitus with complication (HCC)   Iron deficiency anemia   Acute GI bleeding   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HCC)     #1 symptomatic anemia: Patient will be transfused initial 2 units of packed red blood cells.  May require after 4 units.  Work-up will continue for GI bleed.  GI will see patient in the morning.  Suspected slow leak from his cancer.  #2 diabetes: Sliding scale insulin with home regimen.  #3 coronary artery disease: Stable asymptomatic at this point.  Need to have transfusion to avoid demand ischemia.  #4 cancer of the pyloric antrum: Continue with radiation therapy.  Patient is still a full code  #5 hypertension: Again continue with home regimen.  #6 chronic kidney disease stage IV: Appears to be at baseline.  Continue monitoring.  #7 peripheral vascular disease: Status post left AKA.  Appears stable.   DVT prophylaxis: SCD Code Status: Full code Family Communication: Care discussed with patient no family around Disposition Plan: Home Consults called: Gastroenterology Dr. Hilarie Fredrickson Admission status: Inpatient  Severity of Illness: The appropriate patient status for this patient is INPATIENT. Inpatient status is judged to be reasonable and necessary in order to provide the required intensity of service to ensure the patient's safety. The patient's presenting symptoms, physical exam  findings, and initial radiographic and laboratory data in the context of their chronic comorbidities is felt to place them at high risk for further clinical deterioration. Furthermore, it is not anticipated that the patient will be medically stable for discharge from the hospital within 2 midnights of admission. The following factors support the patient status of inpatient.   " The patient's presenting symptoms include weakness. " The worrisome physical exam findings include pale with left AKA. " The initial radiographic and laboratory data are worrisome because of hemoglobin 5.5. " The chronic co-morbidities include cancer of the gastric antrum.   * I certify that at the point of admission it is my clinical judgment that the patient will require inpatient hospital care spanning beyond 2 midnights from the point of admission due to high intensity  of service, high risk for further deterioration and high frequency of surveillance required.Barbette Merino MD [facility] Pager [phone]  If 7PM-7AM, please contact night-coverage [email or web address] Password [facility]  07/20/2018, 11:17 PM

## 2018-07-20 NOTE — ED Triage Notes (Signed)
Per EMS, Pt is coming from [place] at [facility]. EMS was called due to pt reportedly having a HGB of 5.9. Pt has hx of GI bleed, and stomach cancer. Pt has no complaints.

## 2018-07-21 ENCOUNTER — Ambulatory Visit
Admission: RE | Admit: 2018-07-21 | Discharge: 2018-07-21 | Disposition: A | Discharge status: Home / Self Care | Payer: Medicare Other | Source: Ambulatory Visit | Attending: Radiation Oncology | Admitting: Radiation Oncology

## 2018-07-21 ENCOUNTER — Encounter (HOSPITAL_COMMUNITY): Payer: Self-pay

## 2018-07-21 DIAGNOSIS — Z515 Encounter for palliative care: Secondary | ICD-10-CM

## 2018-07-21 DIAGNOSIS — Z7189 Other specified counseling: Secondary | ICD-10-CM

## 2018-07-21 DIAGNOSIS — C163 Malignant neoplasm of pyloric antrum: Secondary | ICD-10-CM

## 2018-07-21 LAB — CBC WITH DIFFERENTIAL/PLATELET
Abs Immature Granulocytes: 0.05 10*3/uL (ref 0.00–0.07)
Basophils Absolute: 0 10*3/uL (ref 0.0–0.1)
Basophils Relative: 1 %
Eosinophils Absolute: 0.7 10*3/uL — ABNORMAL HIGH (ref 0.0–0.5)
Eosinophils Relative: 12 %
HCT: 23.5 % — ABNORMAL LOW (ref 39.0–52.0)
Hemoglobin: 7.7 g/dL — ABNORMAL LOW (ref 13.0–17.0)
Immature Granulocytes: 1 %
Lymphocytes Relative: 7 %
Lymphs Abs: 0.4 10*3/uL — ABNORMAL LOW (ref 0.7–4.0)
MCH: 30.6 pg (ref 26.0–34.0)
MCHC: 32.8 g/dL (ref 30.0–36.0)
MCV: 93.3 fL (ref 80.0–100.0)
Monocytes Absolute: 0.4 10*3/uL (ref 0.1–1.0)
Monocytes Relative: 7 %
Neutro Abs: 4.2 10*3/uL (ref 1.7–7.7)
Neutrophils Relative %: 72 %
Platelets: 154 10*3/uL (ref 150–400)
RBC: 2.52 MIL/uL — ABNORMAL LOW (ref 4.22–5.81)
RDW: 14.2 % (ref 11.5–15.5)
WBC: 5.8 10*3/uL (ref 4.0–10.5)
nRBC: 0 % (ref 0.0–0.2)

## 2018-07-21 LAB — COMPREHENSIVE METABOLIC PANEL
ALT: 10 U/L (ref 0–44)
AST: 12 U/L — ABNORMAL LOW (ref 15–41)
Albumin: 3.1 g/dL — ABNORMAL LOW (ref 3.5–5.0)
Alkaline Phosphatase: 74 U/L (ref 38–126)
Anion gap: 9 (ref 5–15)
BUN: 55 mg/dL — ABNORMAL HIGH (ref 8–23)
CO2: 18 mmol/L — ABNORMAL LOW (ref 22–32)
Calcium: 7.9 mg/dL — ABNORMAL LOW (ref 8.9–10.3)
Chloride: 118 mmol/L — ABNORMAL HIGH (ref 98–111)
Creatinine, Ser: 2.34 mg/dL — ABNORMAL HIGH (ref 0.61–1.24)
GFR calc Af Amer: 32 mL/min — ABNORMAL LOW (ref >60)
GFR calc non Af Amer: 28 mL/min — ABNORMAL LOW (ref >60)
Glucose, Bld: 162 mg/dL — ABNORMAL HIGH (ref 70–99)
Potassium: 3.8 mmol/L (ref 3.5–5.1)
Sodium: 145 mmol/L (ref 135–145)
Total Bilirubin: 0.2 mg/dL — ABNORMAL LOW (ref 0.3–1.2)
Total Protein: 5.7 g/dL — ABNORMAL LOW (ref 6.5–8.1)

## 2018-07-21 LAB — PREPARE RBC (CROSSMATCH)

## 2018-07-21 LAB — GLUCOSE, CAPILLARY
Glucose-Capillary: 153 mg/dL — ABNORMAL HIGH (ref 70–99)
Glucose-Capillary: 125 mg/dL — ABNORMAL HIGH (ref 70–99)
Glucose-Capillary: 203 mg/dL — ABNORMAL HIGH (ref 70–99)
Glucose-Capillary: 133 mg/dL — ABNORMAL HIGH (ref 70–99)

## 2018-07-21 LAB — SARS CORONAVIRUS 2 BY RT PCR (HOSPITAL ORDER, PERFORMED IN ~~LOC~~ HOSPITAL LAB): SARS Coronavirus 2: NEGATIVE

## 2018-07-21 MED ORDER — AMLODIPINE BESYLATE 5 MG PO TABS
5.0000 mg | ORAL_TABLET | Freq: Every day | ORAL | Status: DC
  Administered 2018-07-21 – 2018-07-23 (×3): 5 mg via ORAL
  Filled 2018-07-21 (×3): qty 1

## 2018-07-21 MED ORDER — ATORVASTATIN CALCIUM 40 MG PO TABS
40.0000 mg | ORAL_TABLET | Freq: Every day | ORAL | Status: DC
  Administered 2018-07-21 – 2018-07-22 (×2): 40 mg via ORAL
  Filled 2018-07-21 (×2): qty 1

## 2018-07-21 MED ORDER — PANTOPRAZOLE SODIUM 40 MG PO TBEC
40.0000 mg | DELAYED_RELEASE_TABLET | Freq: Two times a day (BID) | ORAL | Status: DC
  Administered 2018-07-21 – 2018-07-23 (×4): 40 mg via ORAL
  Filled 2018-07-21 (×4): qty 1

## 2018-07-21 MED ORDER — CLONIDINE HCL 0.2 MG/24HR TD PTWK: 0.2000 mg | MEDICATED_PATCH | TRANSDERMAL | Status: DC

## 2018-07-21 MED ORDER — TORSEMIDE 20 MG PO TABS
40.0000 mg | ORAL_TABLET | Freq: Every day | ORAL | Status: DC
  Administered 2018-07-21 – 2018-07-23 (×3): 40 mg via ORAL
  Filled 2018-07-21 (×3): qty 2

## 2018-07-21 MED ORDER — FERROUS SULFATE 325 (65 FE) MG PO TABS
325.0000 mg | ORAL_TABLET | Freq: Every day | ORAL | Status: DC
  Administered 2018-07-22 – 2018-07-23 (×2): 325 mg via ORAL
  Filled 2018-07-21 (×2): qty 1

## 2018-07-21 MED ORDER — CARVEDILOL 12.5 MG PO TABS
12.5000 mg | ORAL_TABLET | Freq: Two times a day (BID) | ORAL | Status: DC
  Administered 2018-07-21 – 2018-07-23 (×4): 12.5 mg via ORAL
  Filled 2018-07-21 (×4): qty 1

## 2018-07-21 MED ORDER — DOXAZOSIN MESYLATE 8 MG PO TABS
8.0000 mg | ORAL_TABLET | Freq: Every evening | ORAL | Status: DC
  Administered 2018-07-21 – 2018-07-22 (×2): 8 mg via ORAL
  Filled 2018-07-21 (×3): qty 1

## 2018-07-21 MED ORDER — ISOSORBIDE MONONITRATE ER 60 MG PO TB24
120.0000 mg | ORAL_TABLET | Freq: Every day | ORAL | Status: DC
  Administered 2018-07-21 – 2018-07-23 (×3): 120 mg via ORAL
  Filled 2018-07-21 (×3): qty 2

## 2018-07-21 MED ORDER — HYDRALAZINE HCL 50 MG PO TABS
100.0000 mg | ORAL_TABLET | Freq: Three times a day (TID) | ORAL | Status: DC
  Administered 2018-07-21 – 2018-07-23 (×7): 100 mg via ORAL
  Filled 2018-07-21 (×7): qty 2

## 2018-07-21 MED ORDER — LEVOTHYROXINE SODIUM 100 MCG PO TABS
100.0000 ug | ORAL_TABLET | Freq: Every day | ORAL | Status: DC
  Administered 2018-07-22 – 2018-07-23 (×2): 100 ug via ORAL
  Filled 2018-07-21 (×2): qty 1

## 2018-07-21 NOTE — Care Plan (Signed)
Patient has been followed by palliative care on previous admissions. If appropriate please consider placing an inpatient palliative care order during this admission so we can continue to assist the patient and family with goals, decision making and care coordination. Patient has ACP documents with designated HCPOA available in the patient demographic storyboard side bar.   Lane Hacker, DO Palliative Medicine [phone]

## 2018-07-21 NOTE — TOC Initial Note (Signed)
Transition of Care [facility]) - Initial/Assessment Note    Patient Details  Name: Leonard [name] MRN: [id number] Date of Birth: [birth year]  Transition of Care [facility]) CM/SW Contact:    Nila Nephew, LCSW Phone Number: [phone] coverage for [phone] 07/21/2018, 9:57 AM  Clinical Narrative:                 Pt is resident of [facility] and plan to return at DC.  Several involved family members in area who are supportive. Pt has had several hospitalizations in past 2 months with anemia needing blood transfusions.         Patient Goals and CMS Choice    did not participate in goal setting    Expected Discharge Plan and Services        SNF          Prior Living Arrangements/Services      SNF                 Activities of Daily Living Home Assistive Devices/Equipment: Wheelchair ADL Screening (condition at time of admission) Patient's cognitive ability adequate to safely complete daily activities?: Yes Is the patient deaf or have difficulty hearing?: No Does the patient have difficulty seeing, even when wearing glasses/contacts?: No Does the patient have difficulty concentrating, remembering, or making decisions?: No Patient able to express need for assistance with ADLs?: Yes Does the patient have difficulty dressing or bathing?: Yes Independently performs ADLs?: Yes (appropriate for developmental age) Does the patient have difficulty walking or climbing stairs?: Yes Weakness of Legs: Both Weakness of Arms/Hands: None  Permission Sought/Granted                  Emotional Assessment              Admission diagnosis:  Upper GI bleed [K92.2] Patient Active Problem List   Diagnosis Date Noted  . Symptomatic anemia 07/20/2018  . Hematemesis/vomiting blood 07/15/2018  . Acute blood loss anemia 07/15/2018  . Chronic blood loss anemia   . Upper GI bleed 06/23/2018  . Acute renal failure superimposed on chronic kidney disease ([place])    . Abdominal pain   . DNR (do not resuscitate)   . 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 DNR (do not resuscitate) discussion   . Anemia 03/29/2018  . Acute GI bleeding 03/28/2018  . Dysphagia 10/01/2017  . Palliative care by specialist   . Severe comorbid illness   . Moderate malnutrition ([place]) 08/27/2017  . Hypoxemia   . Atelectasis   . Chronic respiratory failure with hypoxia ([place]) 08/25/2017  . Pulmonary artery hypertension ([place]) 07/25/2017  . Pressure injury of skin 07/20/2017  . Goals of care, counseling/discussion 02/11/2017  . Diabetic polyneuropathy associated with type 2 diabetes mellitus ([place]) 01/14/2017  . Pleural effusion   . Chronic anemia 02/28/2016  . Weakness generalized 01/21/2016  . Diabetic retinopathy ([place]) 11/13/2015  . Iron deficiency anemia 08/03/2015  . Type II diabetes mellitus with complication ([place])   . Chronic kidney disease (CKD), stage III (moderate) (HCC)   . MGUS (monoclonal gammopathy of unknown significance) 01/15/2015  . Deficiency anemia 01/15/2015  . CKD (chronic kidney disease) stage 4, GFR 15-29 ml/min (HCC) 01/15/2015  . Hypothyroidism 11/22/2014  . COPD GOLD III  02/16/2014  . Anemia of renal disease   . Chronic diastolic CHF (congestive heart failure) ([place])   . History of tobacco use 08/23/2013  . 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  CAD (coronary artery disease) 04/06/2013  . PVD - hx of Rt SFA PTA and s/p Rt 4-5th toe amp Dec 2014 04/05/2013  . 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 Mixed hyperlipidemia 07/17/2008   PCP:  Kathrene Alu, MD Pharmacy:  No Pharmacies Listed    Social Determinants of Health (SDOH) Interventions    Readmission Risk Interventions No flowsheet data found.

## 2018-07-21 NOTE — Progress Notes (Signed)
PROGRESS NOTE    Leonard [name]  CXK:[id number] DOB: [birth year] DOA: 07/20/2018 PCP: Kathrene Alu, MD     Brief Narrative:  Leonard [name] is a 68 y.o. male with medical history significant of cancer of the stomach, diabetes, coronary artery disease, peripheral vascular disease status post left AKA, chronic anemia, chronic kidney disease stage IV, COPD and hyperlipidemia with ongoing tobacco abuse who is undergoing radiation therapy now.  Patient was called at home and told that his hemoglobin is down to 5.9.  He has been feeling weak.  He denied any shortness of breath.  Occasional exertional dyspnea.  He was seen in the ER and hemoglobin is down to 5.5.  Patient has guaiac positive stool but not dark.  Denied any bright red blood per rectum.  Suspected slow GI bleed from his gastric cancer and is being admitted for treatment.  He was transfused 2 units packed red blood cells on admission.  Discussed with GI briefly, they recommend supportive care with as needed transfusions.  New events last 24 hours / Subjective: No acute events.  Just returned from radiation treatment this morning.  Main complaint is hunger.  Assessment & Plan:   Principal Problem:   Symptomatic anemia Active Problems:   CAD (coronary artery disease)   Anemia of renal disease   COPD GOLD III    CKD (chronic kidney disease) stage 4, GFR 15-29 ml/min (HCC)   Type II diabetes mellitus with complication (HCC)   Iron deficiency anemia   Acute GI bleeding   Anemia   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HCC)   Symptomatic anemia due to chronic blood loss -Patient is transfused 2 units packed red blood cells on admission -Hemoglobin this morning 7.7, continue to trend -Discussed with GI, they recommend PRN transfusion.  No procedures planned unless clinical change  Cancer of the pyloric antrum -Currently undergoing radiation treatment -Patient needs to be on a soft mechanical diet, not a regular diet which  may contribute to the friability of his situation with recurrent bleeding from his gastric cancer -Palliative care consult  Type 2 diabetes -Sliding scale insulin  Coronary artery disease -Stable  Essential hypertension -Blood pressure elevated this afternoon 183/75. Resume home meds.   Chronic kidney disease stage IV -Baseline creatinine around 3.1 -Stable  Peripheral vascular disease -Status post left AKA     DVT prophylaxis: SCD Code Status: Full code Family Communication: None Disposition Plan: Pending further stabilization of hemoglobin, return back to SNF on discharge   Consultants:   Palliative care  Procedures:   None  Antimicrobials:  Anti-infectives (From admission, onward)   None        Objective: Vitals:   07/21/18 0245 07/21/18 0530 07/21/18 0616 07/21/18 1317  BP: (!) 141/44 (!) 186/66 (!) 177/64 (!) 183/75  Pulse: 70 68 68 70  Resp: 20 20 20    Temp: 98.4 F (36.9 C) 98.7 F (37.1 C) 98.3 F (36.8 C) 98.1 F (36.7 C)  TempSrc: Oral Oral Oral   SpO2: 100% 100% 100% 99%  Weight:   79.5 kg   Height:        Intake/Output Summary (Last 24 hours) at 07/21/2018 1405 Last data filed at 07/21/2018 1028 Gross per 24 hour  Intake 1634.62 ml  Output 975 ml  Net 659.62 ml   Filed Weights   07/20/18 1744 07/21/18 0616  Weight: 78.2 kg 79.5 kg    Examination:  General exam: Appears calm and comfortable  Respiratory system: Clear to  auscultation. Respiratory effort normal. Cardiovascular system: S1 & S2 heard, RRR. No JVD, murmurs, rubs, gallops or clicks. No pedal edema. Gastrointestinal system: Abdomen is nondistended, soft and nontender. No organomegaly or masses felt. Normal bowel sounds heard. Central nervous system: Alert and oriented. No focal neurological deficits. Skin: No rashes, lesions or ulcers Psychiatry: Judgement and insight appear normal. Mood & affect appropriate.   Data Reviewed: I have personally reviewed following labs  and imaging studies  CBC: Recent Labs  Lab 07/15/18 1455 07/16/18 0727 07/16/18 1054 07/16/18 1734 07/20/18 1748 07/20/18 2140 07/21/18 0809  WBC 7.2 6.3  --   --  4.9 4.8 5.8  NEUTROABS 5.9  --   --   --   --  3.4 4.2  HGB 5.7* 7.0*  7.0* 7.3* 6.6* 5.5* 6.0* 7.7*  HCT 18.1* 21.4*  22.0* 22.8* 20.8* 17.1* 19.2* 23.5*  MCV 93.3 92.6  --   --  95.0 96.5 93.3  PLT 193 191  --   --  184 179 270   Basic Metabolic Panel: Recent Labs  Lab 07/15/18 1455 07/16/18 0727 07/20/18 1748 07/21/18 0809  NA 139 143 140 145  K 5.1 4.0 3.7 3.8  CL 107 111 111 118*  CO2 21* 21* 20* 18*  GLUCOSE 272* 160* 286* 162*  BUN 111* 103* 61* 55*  CREATININE 3.26* 3.16* 2.63* 2.34*  CALCIUM 8.1* 8.2* 8.0* 7.9*   GFR: Estimated Creatinine Clearance: 29.6 mL/min (A) (by C-G formula based on SCr of 2.34 mg/dL (H)). Liver Function Tests: Recent Labs  Lab 07/15/18 1455 07/16/18 0727 07/20/18 1748 07/21/18 0809  AST 9* 9* 13* 12*  ALT 8 8 11 10   ALKPHOS 72 71 81 74  BILITOT 0.4 0.3 0.3 0.2*  PROT 5.8* 5.7* 5.9* 5.7*  ALBUMIN 2.9* 2.9* 3.1* 3.1*   Recent Labs  Lab 07/15/18 1455  LIPASE 45   No results for input(s): AMMONIA in the last 168 hours. Coagulation Profile: No results for input(s): INR, PROTIME in the last 168 hours. Cardiac Enzymes: No results for input(s): CKTOTAL, CKMB, CKMBINDEX, TROPONINI in the last 168 hours. BNP (last 3 results) No results for input(s): PROBNP in the last 8760 hours. HbA1C: No results for input(s): HGBA1C in the last 72 hours. CBG: Recent Labs  Lab 07/16/18 0737 07/16/18 1200 07/20/18 2129 07/21/18 0739 07/21/18 1136  GLUCAP 135* 215* 211* 153* 125*   Lipid Profile: No results for input(s): CHOL, HDL, LDLCALC, TRIG, CHOLHDL, LDLDIRECT in the last 72 hours. Thyroid Function Tests: No results for input(s): TSH, T4TOTAL, FREET4, T3FREE, THYROIDAB in the last 72 hours. Anemia Panel: No results for input(s): VITAMINB12, FOLATE, FERRITIN, TIBC,  IRON, RETICCTPCT in the last 72 hours. Sepsis Labs: No results for input(s): PROCALCITON, LATICACIDVEN in the last 168 hours.  Recent Results (from the past 240 hour(s))  Novel Coronavirus,NAA,(SEND-OUT TO REF LAB - TAT 24-48 hrs); Hosp Order     Status: None   Collection Time: 07/15/18  4:48 PM   Specimen: Nasopharyngeal Swab; Respiratory  Result Value Ref Range Status   SARS-CoV-2, NAA NOT DETECTED NOT DETECTED Final    Comment: (NOTE) Testing was performed using the cobas(R) SARS-CoV-2 test. This test was developed and its performance characteristics determined by [employer]. This test has not been FDA cleared or approved. This test has been authorized by FDA under an Emergency Use Authorization (EUA). This test is only authorized for the duration of time the declaration that circumstances exist justifying the authorization of the emergency use  of in vitro diagnostic tests for detection of SARS-CoV-2 virus and/or diagnosis of COVID-19 infection under section 564(b)(1) of the Act, 21 U.S.C. 106YIR-4(W)(5), unless the authorization is terminated or revoked sooner. When diagnostic testing is negative, the possibility of a false negative result should be considered in the context of a patient's recent exposures and the presence of clinical signs and symptoms consistent with COVID-19. An individual without symptoms of COVID-19 and who is not shedding SARS-CoV-2 virus would expect to have  a negative (not detected) result in this assay. Performed At: [facility] [place], [place] [id number] Rush Farmer MD XF:[id number]    [place]  Final    Comment: Performed at [facility] [address], [place], [place] [address]  SARS Coronavirus 2 (CEPHEID - Performed in [place] hospital lab), Hosp Order     Status: None   Collection Time: 07/21/18  8:37 AM   Specimen: Nasopharyngeal Swab  Result Value Ref  Range Status   SARS Coronavirus 2 NEGATIVE NEGATIVE Final    Comment: (NOTE) If result is NEGATIVE SARS-CoV-2 target nucleic acids are NOT DETECTED. The SARS-CoV-2 RNA is generally detectable in upper and lower  respiratory specimens during the acute phase of infection. The lowest  concentration of SARS-CoV-2 viral copies this assay can detect is 250  copies / mL. A negative result does not preclude SARS-CoV-2 infection  and should not be used as the sole basis for treatment or other  patient management decisions.  A negative result may occur with  improper specimen collection / handling, submission of specimen other  than nasopharyngeal swab, presence of viral mutation(s) within the  areas targeted by this assay, and inadequate number of viral copies  (<250 copies / mL). A negative result must be combined with clinical  observations, patient history, and epidemiological information. If result is POSITIVE SARS-CoV-2 target nucleic acids are DETECTED. The SARS-CoV-2 RNA is generally detectable in upper and lower  respiratory specimens dur ing the acute phase of infection.  Positive  results are indicative of active infection with SARS-CoV-2.  Clinical  correlation with patient history and other diagnostic information is  necessary to determine patient infection status.  Positive results do  not rule out bacterial infection or co-infection with other viruses. If result is PRESUMPTIVE POSTIVE SARS-CoV-2 nucleic acids MAY BE PRESENT.   A presumptive positive result was obtained on the submitted specimen  and confirmed on repeat testing.  While 2019 novel coronavirus  (SARS-CoV-2) nucleic acids may be present in the submitted sample  additional confirmatory testing may be necessary for epidemiological  and / or clinical management purposes  to differentiate between  SARS-CoV-2 and other Sarbecovirus currently known to infect humans.  If clinically indicated additional testing with an  alternate test  methodology [phone]) is advised. The SARS-CoV-2 RNA is generally  detectable in upper and lower respiratory sp ecimens during the acute  phase of infection. The expected result is Negative. Fact Sheet for Patients:  StrictlyIdeas.no Fact Sheet for Healthcare Providers: BankingDealers.co.za This test is not yet approved or cleared by the [place] FDA and has been authorized for detection and/or diagnosis of SARS-CoV-2 by FDA under an Emergency Use Authorization (EUA).  This EUA will remain in effect (meaning this test can be used) for the duration of the COVID-19 declaration under Section 564(b)(1) of the Act, 21 U.S.C. section 360bbb-3(b)(1), unless the authorization is terminated or revoked sooner. Performed at [facility], [name] [place],  [id number]        Radiology Studies: No results found.    Scheduled Meds: . sodium chloride   Intravenous Once  . insulin aspart  0-5 Units Subcutaneous QHS  . insulin aspart  0-9 Units Subcutaneous TID WC  . pantoprazole (PROTONIX) IV  40 mg Intravenous Q12H   Continuous Infusions: . sodium chloride 100 mL/hr at 07/21/18 1028     LOS: 1 day    Time spent: 35 minutes   Dessa Phi, DO [facility] [email or web address] 07/21/2018, 2:05 PM

## 2018-07-21 NOTE — Progress Notes (Signed)
BP= 183/75. Patient has history of HTN. Medication was given as MD ordered. Will recheck BP in 30 minutes

## 2018-07-21 NOTE — Consult Note (Signed)
Consultation Note Date: 07/21/2018   [name]  DOB: [birth year]  MRN: [id number]  Age / Sex: 68 y.o., male  PCP: Kathrene Alu, MD Referring Physician: Dessa Phi, DO  Reason for Consultation: Establishing goals of care  HPI/Patient Profile: 68 y.o. male  with past medical history of cancer of stomach, CAD, DM, PVD s/p L AKA, chronic anemia, CKD IV, COPD, HLP admitted on 07/20/2018 with weakness and low hgb.  He has had prior admissions for similar.  He was deemed not a candidate for surgical intervention and is undergoing curative dose radiation therapy.  He was seen by palliative care during prior hospitalizations and palliative care consulted to continue discussion on goals of care.    Clinical Assessment and Goals of Care: Chart reviewed including personal review of pertinent labs and imaging.    I saw and examined [name] today.  He is a very pleasant man lying in bed watching TV.  Reports that "nothing is on and I wish I had a newspaper or something."  We discussed his clinical course with focus in the last couple of weeks since he was last seen by our team.  He reports doing well and settling into routine at SNF.  He wants to return as soon as possible (his niece reports he will need to restart quarantine period as he has now been readmitted to he hospital and is very anxious to complete this).  He is clear in his desire to continue with current interventions and is placing his faith in God to cure him.  He asked me to call and update his family and I reviewed conversation and plan with his niece/HCPOA, Rosalyn.  Questions and concerns addressed.   PMT will continue to support holistically.  SUMMARY OF RECOMMENDATIONS   - Full code/Full Scope treatment - [name] continues with his radiation treatments with the hope that these will stop his recurrent bleeding.  He began  radiation approximately 2 weeks ago, which would be minimum amount of time where it would be expected to see change in episodic bleeding.  He would like to continue with radiation and supportive care including further transfusions as necessary.  We did discuss that further conversations may be necessary based upon his clinical course moving forward (such as if he continues to have recurrent bleed after completion of radiation).  He understands that he is currently too weak for any surgical procedures, but is not opposed to consideration for future surgical intervention if it is likely to add time to his life and he becomes strong enough to tolerate it.  - He is anxious to return to skilled facility as soon as possible. - I called and reviewed my conversation with his niece/HCPOA and answered all her questions to the best of my ability   Code Status/Advance Care Planning:  Full code  Additional Recommendations (Limitations, Scope, Preferences):  Full Scope Treatment  Psycho-social/Spiritual:   Desire for further Chaplaincy support:Did not address today  Additional Recommendations: Caregiving  Support/Resources  Prognosis:   Unable to determine  Discharge Planning: SNF once medically ready      Primary Diagnoses: Present on Admission: . Anemia of renal disease . Type II diabetes mellitus with complication ([place]) . Iron deficiency anemia . Acute GI bleeding . 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[place]) . CAD (coronary artery disease) . COPD GOLD III  . Symptomatic anemia . CKD (chronic kidney disease) stage 4, GFR 15-29 ml/min (HCC) . Anemia   I have reviewed the medical record, interviewed the patient and family, and examined the patient. The following aspects are pertinent.  Past Medical History:  Diagnosis Date  . Acute on chronic diastolic heart failure ([place]) 07/04/2014  . Anemia of renal disease   . Atherosclerotic peripheral vascular disease with gangrene ([place]) 02/14/2017   . CAD (coronary artery disease) 04/06/2013  . Cancer ([place])    skin cancer-  . CHF (congestive heart failure) ([place])   . Chronic anemia 02/28/2016  . Chronic deep vein thrombosis (DVT) of femoral vein of left lower extremity ([place]) 09/03/2016  . Chronic respiratory failure with hypoxia ([place]) 08/25/2017  . CKD (chronic kidney disease) stage 4, GFR 15-29 ml/min (HCC) 01/15/2015   Follows with [facility].  Last visits:  07/23/16: No change in meds. Worried that if we increase diuretics could lead to another AKI  . CKD (chronic kidney disease), stage IV ([place])    07/07/2017  . Claudication ([place])   . Collapse of left talus 01/14/2017  . COPD GOLD III  02/16/2014   Quit smoking 2014  - PFT's  04/06/13   FEV1 1.28 (44 % ) ratio 48  p no % improvement from saba p ?  prior to study with DLCO  43/54 % corrects to 97  % for alv volume - 01/31/2016  After extensive coaching HFA effectiveness =    75% from baseline 0 > changed to sprivia respimat   - PFT's  03/17/2016  FEV1 2.25  (81 % ) ratio 88  p 2 % improvement from saba p spiriva respimat x 2  prior to study with  . Coronary artery disease   . Diabetes ([place])   . Diabetic polyneuropathy associated with type 2 diabetes mellitus ([place]) 01/14/2017  . Diabetic retinopathy ([place]) 11/13/2015   Per Dr. Erenest Rasher eye exam in 10/17/15, mild background diabetic retinopathy  . Fatigue associated with anemia 01/21/2016  . Gangrene of toe ([place])    right 5th/notes 01/04/2013   . HCAP (healthcare-associated pneumonia)   . Hematuria 06/27/2015  . High cholesterol   . History of tobacco use 08/23/2013   Smoked pack per day for 46 years Quit smoking - 02/03/2013   . Hypertension   . Hypothyroidism 11/22/2014  . Idiopathic chronic gout of left foot without tophus   . Iron deficiency anemia 08/03/2015  . Left foot pain 10/29/2016  . Long term (current) use of anticoagulants [Z79.01] 09/09/2016  . MGUS (monoclonal gammopathy of unknown significance) 01/15/2015  . Mixed hyperlipidemia  07/17/2008  . Morbid obesity due to excess calories ([place]) 03/17/2016  . PAD (peripheral artery disease) ([place])   . Pleural effusion   . Pneumonitis 07/26/2017  . Pulmonary artery hypertension ([place]) 07/25/2017  . PVD - hx of Rt SFA PTA and s/p Rt 4-5th toe amp Dec 2014 04/05/2013  . 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 Respiratory failure ([place]) 08/13/2017  . Respiratory failure with hypoxia ([place]) 09/23/2017  . 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 Secondary hypertension   . Shortness of breath dyspnea  with exertion  . Skin lesion of face 02/22/2015  . SOB (shortness of breath)   . Type 2 diabetes mellitus with pressure callus ([place]) 12/29/2016  . Type II diabetes mellitus (HCC)    Type II  . Type II diabetes mellitus with complication [facility])    Social History   Socioeconomic History  . Marital status: Single    Spouse name: Not on file  . Number of children: 0  . Years of education: Not on file  . Highest education level: Not on file  Occupational History  . Not on file  Social Needs  . Financial resource strain: Not on file  . Food insecurity    Worry: Not on file    Inability: Not on file  . Transportation needs    Medical: No    Non-medical: No  Tobacco Use  . Smoking status: Former Smoker    Packs/day: 1.00    Years: 46.00    Pack years: 46.00    Types: Cigarettes    Start date: 02/04/1967    Quit date: 02/03/2013    Years since quitting: 5.4  . Smokeless tobacco: Never Used  Substance and Sexual Activity  . Alcohol use: No    Alcohol/week: 0.0 standard drinks  . Drug use: No  . Sexual activity: Not Currently  Lifestyle  . Physical activity    Days per week: Not on file    Minutes per session: Not on file  . Stress: Not on file  Relationships  . Social Herbalist on phone: Patient refused    Gets together: Patient refused    Attends religious service: Patient refused    Active member of club or organization: Patient refused    Attends meetings of clubs or  organizations: Patient refused    Relationship status: Patient refused  Other Topics Concern  . Not on file  Social History Narrative   Retired - former Sports coach.   Patient in skilled nursing facility.   Family History  Problem Relation Age of Onset  . Cancer Mother        lung cancer  . Hypertension Mother   . Cancer Sister        patient thinks it was uterine cancer  . Hypertension Sister   . Heart attack Sister   . Stroke Neg Hx    Scheduled Meds: . amLODipine  5 mg Oral Daily  . atorvastatin  40 mg Oral q1800  . carvedilol  12.5 mg Oral BID WC  . [START ON 07/26/2018] cloNIDine  0.2 mg Transdermal Weekly  . doxazosin  8 mg Oral QPM  . [START ON 07/22/2018] ferrous sulfate  325 mg Oral Q breakfast  . hydrALAZINE  100 mg Oral Q8H  . insulin aspart  0-5 Units Subcutaneous QHS  . insulin aspart  0-9 Units Subcutaneous TID WC  . isosorbide mononitrate  120 mg Oral Daily  . [START ON 07/22/2018] levothyroxine  100 mcg Oral QAC breakfast  . pantoprazole  40 mg Oral BID AC  . torsemide  40 mg Oral Daily   Continuous Infusions: PRN Meds:.ondansetron **OR** ondansetron (ZOFRAN) IV Medications Prior to Admission:  Prior to Admission medications   Medication Sig Start Date End Date Taking? Authorizing Provider  acetaminophen (TYLENOL) 325 MG tablet Take 650 mg by mouth every 6 (six) hours as needed for fever.   Yes [provider]  amLODipine (NORVASC) 5 MG tablet Take 1 tablet (5 mg total) by mouth daily.  07/12/18  Yes Tammi Klippel, Sherin, DO  atorvastatin (LIPITOR) 40 MG tablet TAKE ONE (1) TABLET BY MOUTH EVERY DAY AT 6:00PM Patient taking differently: Take 40 mg by mouth daily at 6 PM.  11/17/16  Yes Larey Dresser, MD  carvedilol (COREG) 12.5 MG tablet Take 1 tablet (12.5 mg total) by mouth 2 (two) times daily with a meal. 08/30/17  Yes Meccariello, Bernita Raisin, DO  cholecalciferol (VITAMIN D) 25 MCG (1000 UT) tablet Take 2,000 Units by mouth 3 (three) times daily.    Yes [provider]  cloNIDine (CATAPRES - DOSED IN MG/24 HR) 0.2 mg/24hr patch Place 1 patch (0.2 mg total) onto the skin once a week. 08/09/17  Yes Steve Rattler, DO  Darbepoetin Alfa (ARANESP) 40 MCG/0.4ML SOSY injection Inject 0.4 mLs (40 mcg total) into the skin every 30 (thirty) days. 09/28/17  Yes Meccariello, Bernita Raisin, DO  doxazosin (CARDURA) 8 MG tablet Take 1 tablet (8 mg total) by mouth every evening. Patient taking differently: Take 8 mg by mouth daily.  12/01/16  Yes Hensel, Jamal Collin, MD  feeding supplement, ENSURE ENLIVE, (ENSURE ENLIVE) LIQD Take 237 mLs by mouth 3 (three) times daily between meals. 08/07/17  Yes Riccio, Angela C, DO  ferrous sulfate (FEROSUL) 325 (65 FE) MG tablet Take 1 tablet (325 mg total) by mouth daily at 12 noon. Patient taking differently: Take 325 mg by mouth daily.  08/31/17  Yes Meccariello, Bernita Raisin, DO  Glycopyrrolate (LONHALA MAGNAIR STARTER KIT) 25 MCG/ML SOLN Inhale 1 mL into the lungs 2 (two) times a day.   Yes [provider]  hydrALAZINE (APRESOLINE) 100 MG tablet TAKE ONE TABLET BY MOUTH EVERY EIGHT HOURS Patient taking differently: Take 100 mg by mouth every 8 (eight) hours.  11/14/16  Yes [name], MD  insulin aspart (NOVOLOG) 100 UNIT/ML injection Inject 0-10 Units into the skin 3 (three) times daily before meals. <150= 0 units, 151-200= 2 units, 201-250= 4 units, 251-300= 6 units, 301-350= 8 units, 351-400= 10 units   Yes [provider]  ipratropium-albuterol (DUONEB) 0.5-2.5 (3) MG/3ML SOLN Take 3 mLs by nebulization every 6 (six) hours as needed (sob).   Yes [provider]  isosorbide mononitrate (IMDUR) 120 MG 24 hr tablet Take 1 tablet (120 mg total) by mouth daily. 10/02/17  Yes Anderson, Chelsey L, DO  levothyroxine (SYNTHROID, LEVOTHROID) 100 MCG tablet Take 100 mcg by mouth daily before breakfast.    Yes [provider]  nicotine (NICODERM CQ - DOSED IN MG/24 HOURS) 14 mg/24hr patch  Place 14 mg onto the skin daily.   Yes [provider]  nitroGLYCERIN (NITROSTAT) 0.4 MG SL tablet Place 1 tablet (0.4 mg total) under the tongue every 5 (five) minutes as needed for chest pain. 11/14/16  Yes [name], MD  ondansetron (ZOFRAN) 4 MG tablet Take 4 mg by mouth every 8 (eight) hours as needed for nausea or vomiting.   Yes [provider]  pantoprazole (PROTONIX) 40 MG tablet Take 1 tablet (40 mg total) by mouth 2 (two) times daily. 04/03/18  Yes Beard, Aldona Bar N, DO  polyethylene glycol (MIRALAX / GLYCOLAX) packet Take 17 g by mouth daily as needed for mild constipation or moderate constipation. 08/07/17  Yes Riccio, Angela C, DO  torsemide (DEMADEX) 20 MG tablet Take 2 tablets (40 mg total) by mouth daily. 02/16/18  Yes Larey Dresser, MD  umeclidinium bromide (INCRUSE ELLIPTA) 62.5 MCG/INH AEPB Inhale 1 puff into the lungs daily.  06/28/18  Yes Mullis, Kiersten P, DO  Blood Glucose Monitoring Suppl (ONETOUCH VERIO FLEX SYSTEM) w/Device KIT 1 kit by Does not apply route daily. Patient not taking: Reported on 07/15/2018 10/10/16   McDiarmid, Blane Ohara, MD  glucose blood (ONETOUCH VERIO) test strip Use as instructed to test three times daily. ICD-10 code: E11.22 Patient not taking: Reported on 07/15/2018 10/15/16   McDiarmid, Blane Ohara, MD  [facility] DELICA LANCETS 54H MISC 1 each by Does not apply route 3 (three) times daily. ICD-10 code: E11.22 Patient not taking: Reported on 07/15/2018 10/15/16   McDiarmid, Blane Ohara, MD   Allergies  Allergen Reactions  . Penicillins Other (See Comments)    Has patient had a PCN reaction causing immediate rash, facial/tongue/throat swelling, SOB or lightheadedness with hypotension: Unknown Has patient had a PCN reaction causing severe rash involving mucus membranes or skin necrosis: Unknown Has patient had a PCN reaction that required hospitalization: Unknown Has patient had a PCN reaction occurring within the last 10 years: Unknown If  all of the above answers are "NO", then may proceed with Cephalosporin use.    Review of Systems  Constitutional: Positive for fatigue.  Gastrointestinal: Positive for blood in stool.  Neurological: Positive for weakness.   Physical Exam General: Alert, awake, in no acute distress.  HEENT: No bruits, no goiter, no JVD Heart: Regular rate and rhythm. No murmur appreciated. Lungs: Good air movement, clear Abdomen: Soft, nontender, nondistended, positive bowel sounds.  Ext: No significant edema. L AKA noted Skin: Warm and dry Neuro: Grossly intact, nonfocal.  Vital Signs: BP (!) 148/82 (BP Location: Right Arm)   Pulse 70   Temp 98.1 F (36.7 C)   Resp 20   Ht '5\' 8"'  (1.727 m)   Wt 79.5 kg   SpO2 99%   BMI 26.65 kg/m  Pain Scale: 0-10   Pain Score: 0-No pain   SpO2: SpO2: 99 % O2 Device:SpO2: 99 % O2 Flow Rate: .   IO: Intake/output summary:   Intake/Output Summary (Last 24 hours) at 07/21/2018 1659 Last data filed at 07/21/2018 1600 Gross per 24 hour  Intake 2346.65 ml  Output 1175 ml  Net 1171.65 ml    LBM: Last BM Date: (unsure) Baseline Weight: Weight: 78.2 kg Most recent weight: Weight: 79.5 kg     Palliative Assessment/Data:   Flowsheet Rows     Most Recent Value  Intake Tab  Referral Department  Hospitalist  Unit at Time of Referral  Med/Surg Unit  Palliative Care Primary Diagnosis  Cancer  Date Notified  07/21/18  Palliative Care Type  Return patient Palliative Care  Reason for referral  Clarify Goals of Care  Date of Admission  07/20/18  Date first seen by Palliative Care  07/21/18  # of days Palliative referral response time  0 Day(s)  # of days IP prior to Palliative referral  1  Clinical Assessment  Palliative Performance Scale Score  40%  Psychosocial & Spiritual Assessment  Palliative Care Outcomes  Patient/Family meeting held?  Yes  Who was at the meeting?  Patient, niece via phone      Time In: 1600  Time Out: 1715 Time Total: 75  Greater than 50%  of this time was spent counseling and coordinating care related to the above assessment and plan.  Signed by: Micheline Rough, MD   Please contact Palliative Medicine Team phone at [phone] for questions and concerns.  For individual provider: See Shea Evans

## 2018-07-22 ENCOUNTER — Ambulatory Visit
Admission: RE | Admit: 2018-07-22 | Discharge: 2018-07-22 | Disposition: A | Discharge status: Home / Self Care | Payer: Medicare Other | Source: Ambulatory Visit | Attending: Radiation Oncology | Admitting: Radiation Oncology

## 2018-07-22 ENCOUNTER — Telehealth (HOSPITAL_COMMUNITY): Payer: Self-pay

## 2018-07-22 DIAGNOSIS — K922 Gastrointestinal hemorrhage, unspecified: Secondary | ICD-10-CM

## 2018-07-22 LAB — BASIC METABOLIC PANEL
Anion gap: 8 (ref 5–15)
BUN: 49 mg/dL — ABNORMAL HIGH (ref 8–23)
CO2: 19 mmol/L — ABNORMAL LOW (ref 22–32)
Calcium: 8.3 mg/dL — ABNORMAL LOW (ref 8.9–10.3)
Chloride: 114 mmol/L — ABNORMAL HIGH (ref 98–111)
Creatinine, Ser: 2.34 mg/dL — ABNORMAL HIGH (ref 0.61–1.24)
GFR calc Af Amer: 32 mL/min — ABNORMAL LOW (ref >60)
GFR calc non Af Amer: 28 mL/min — ABNORMAL LOW (ref >60)
Glucose, Bld: 140 mg/dL — ABNORMAL HIGH (ref 70–99)
Potassium: 3.7 mmol/L (ref 3.5–5.1)
Sodium: 141 mmol/L (ref 135–145)

## 2018-07-22 LAB — CBC WITH DIFFERENTIAL/PLATELET
Abs Immature Granulocytes: 0.04 10*3/uL (ref 0.00–0.07)
Basophils Absolute: 0 10*3/uL (ref 0.0–0.1)
Basophils Relative: 1 %
Eosinophils Absolute: 0.5 10*3/uL (ref 0.0–0.5)
Eosinophils Relative: 10 %
HCT: 24.6 % — ABNORMAL LOW (ref 39.0–52.0)
Hemoglobin: 7.9 g/dL — ABNORMAL LOW (ref 13.0–17.0)
Immature Granulocytes: 1 %
Lymphocytes Relative: 7 %
Lymphs Abs: 0.4 10*3/uL — ABNORMAL LOW (ref 0.7–4.0)
MCH: 29.6 pg (ref 26.0–34.0)
MCHC: 32.1 g/dL (ref 30.0–36.0)
MCV: 92.1 fL (ref 80.0–100.0)
Monocytes Absolute: 0.4 10*3/uL (ref 0.1–1.0)
Monocytes Relative: 8 %
Neutro Abs: 4.2 10*3/uL (ref 1.7–7.7)
Neutrophils Relative %: 73 %
Platelets: 156 10*3/uL (ref 150–400)
RBC: 2.67 MIL/uL — ABNORMAL LOW (ref 4.22–5.81)
RDW: 14.8 % (ref 11.5–15.5)
WBC: 5.6 10*3/uL (ref 4.0–10.5)
nRBC: 0 % (ref 0.0–0.2)

## 2018-07-22 LAB — GLUCOSE, CAPILLARY
Glucose-Capillary: 131 mg/dL — ABNORMAL HIGH (ref 70–99)
Glucose-Capillary: 200 mg/dL — ABNORMAL HIGH (ref 70–99)
Glucose-Capillary: 158 mg/dL — ABNORMAL HIGH (ref 70–99)
Glucose-Capillary: 123 mg/dL — ABNORMAL HIGH (ref 70–99)

## 2018-07-22 NOTE — Telephone Encounter (Signed)
New message   Just an FYI. We have made several attempts to contact this patient including sending a letter to schedule or reschedule their Myocardial Perfusion. We will be removing the patient from the WQ.    6.18. 20 patient admit to hospital - Leonard [name]  5.21.20 mail reminder letter Leonard [name]  5.11.20 @ 2:44pm lm on home vm - Leonard [name]

## 2018-07-22 NOTE — Progress Notes (Signed)
PROGRESS NOTE    Leonard [name]  MGQ:[id number] DOB: Oct 15, 1950 DOA: 07/20/2018 PCP: Kathrene Alu, MD     Brief Narrative:  Leonard [name] is a 68 y.o. male with medical history significant of cancer of the stomach, diabetes, coronary artery disease, peripheral vascular disease status post left AKA, chronic anemia, chronic kidney disease stage IV, COPD and hyperlipidemia with ongoing tobacco abuse who is undergoing radiation therapy now.  Patient was called at home and told that his hemoglobin is down to 5.9.  He has been feeling weak.  He denied any shortness of breath.  Occasional exertional dyspnea.  He was seen in the ER and hemoglobin is down to 5.5.  Patient has guaiac positive stool but not dark.  Denied any bright red blood per rectum.  Suspected slow GI bleed from his gastric cancer and is being admitted for treatment.  He was transfused 2 units packed red blood cells on admission.  Discussed with GI briefly, they recommend supportive care with as needed transfusions.  New events last 24 hours / Subjective: Feeling about the same, will return to radiation treatment later this morning.  Assessment & Plan:   Principal Problem:   Symptomatic anemia Active Problems:   CAD (coronary artery disease)   Anemia of renal disease   COPD GOLD III    CKD (chronic kidney disease) stage 4, GFR 15-29 ml/min (HCC)   Type II diabetes mellitus with complication (HCC)   Iron deficiency anemia   Acute GI bleeding   Anemia   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HCC)   Symptomatic anemia due to chronic blood loss -Patient is transfused 2 units packed red blood cells on admission -Hemoglobin this morning 7.9, continue to trend -Discussed with GI, they recommend PRN transfusion.  No procedures planned unless clinical change -Patient has had numerous ER visits as well as hospitalizations due to his anemia and blood loss.  Due to various factors, patient has had difficult time getting set up  with outpatient blood transfusions as needed, and has utilized ED and hospitalization for his anemia.  May be a good candidate for LTACH while he finishes his radiation treatments and receives as needed blood transfusions  Cancer of the pyloric antrum -Currently undergoing radiation treatment -Patient needs to be on a soft mechanical diet, not a regular diet which may contribute to the friability of his situation with recurrent bleeding from his gastric cancer -Palliative care consult, patient continues full scope of care   Type 2 diabetes -Sliding scale insulin  Coronary artery disease -Stable.  Continue Imdur  Essential hypertension -Continue Norvasc, Coreg, Catapres, hydralazine, Demadex  Hyperlipidemia -Continue Lipitor  Chronic kidney disease stage IV -Baseline creatinine around 3.1 -Stable  Peripheral vascular disease -Status post left AKA  Hypothyroidism -Continue Synthroid    DVT prophylaxis: SCD Code Status: Full code Family Communication: None Disposition Plan: Pending further stabilization of hemoglobin, consideration for LTACH   Consultants:   Palliative care  Procedures:   None  Antimicrobials:  Anti-infectives (From admission, onward)   None       Objective: Vitals:   07/21/18 1317 07/21/18 1600 07/21/18 2156 07/22/18 0436  BP: (!) 183/75 (!) 148/82 (!) 166/67 (!) 160/65  Pulse: 70  63 67  Resp:   16 16  Temp: 98.1 F (36.7 C)  98.1 F (36.7 C) 98 F (36.7 C)  TempSrc:      SpO2: 99%  99% 98%  Weight:      Height:  Intake/Output Summary (Last 24 hours) at 07/22/2018 1219 Last data filed at 07/22/2018 0500 Gross per 24 hour  Intake 712.03 ml  Output 1500 ml  Net -787.97 ml   Filed Weights   07/20/18 1744 07/21/18 0616  Weight: 78.2 kg 79.5 kg    Examination: General exam: Appears calm and comfortable  Respiratory system: Clear to auscultation. Respiratory effort normal. Cardiovascular system: S1 & S2 heard, RRR. No  JVD, murmurs, rubs, gallops or clicks. No pedal edema. Gastrointestinal system: Abdomen is nondistended, soft and nontender. No organomegaly or masses felt. Normal bowel sounds heard. Central nervous system: Alert and oriented. No focal neurological deficits. Extremities: Left AKA Skin: No rashes, lesions or ulcers Psychiatry: Judgement and insight appear normal. Mood & affect appropriate.     Data Reviewed: I have personally reviewed following labs and imaging studies  CBC: Recent Labs  Lab 07/15/18 1455 07/16/18 0727  07/16/18 1734 07/20/18 1748 07/20/18 2140 07/21/18 0809 07/22/18 0541  WBC 7.2 6.3  --   --  4.9 4.8 5.8 5.6  NEUTROABS 5.9  --   --   --   --  3.4 4.2 4.2  HGB 5.7* 7.0*  7.0*   < > 6.6* 5.5* 6.0* 7.7* 7.9*  HCT 18.1* 21.4*  22.0*   < > 20.8* 17.1* 19.2* 23.5* 24.6*  MCV 93.3 92.6  --   --  95.0 96.5 93.3 92.1  PLT 193 191  --   --  184 179 154 156   < > = values in this interval not displayed.   Basic Metabolic Panel: Recent Labs  Lab 07/15/18 1455 07/16/18 0727 07/20/18 1748 07/21/18 0809 07/22/18 0541  NA 139 143 140 145 141  K 5.1 4.0 3.7 3.8 3.7  CL 107 111 111 118* 114*  CO2 21* 21* 20* 18* 19*  GLUCOSE 272* 160* 286* 162* 140*  BUN 111* 103* 61* 55* 49*  CREATININE 3.26* 3.16* 2.63* 2.34* 2.34*  CALCIUM 8.1* 8.2* 8.0* 7.9* 8.3*   GFR: Estimated Creatinine Clearance: 29.6 mL/min (A) (by C-G formula based on SCr of 2.34 mg/dL (H)). Liver Function Tests: Recent Labs  Lab 07/15/18 1455 07/16/18 0727 07/20/18 1748 07/21/18 0809  AST 9* 9* 13* 12*  ALT 8 8 11 10   ALKPHOS 72 71 81 74  BILITOT 0.4 0.3 0.3 0.2*  PROT 5.8* 5.7* 5.9* 5.7*  ALBUMIN 2.9* 2.9* 3.1* 3.1*   Recent Labs  Lab 07/15/18 1455  LIPASE 45   No results for input(s): AMMONIA in the last 168 hours. Coagulation Profile: No results for input(s): INR, PROTIME in the last 168 hours. Cardiac Enzymes: No results for input(s): CKTOTAL, CKMB, CKMBINDEX, TROPONINI in the  last 168 hours. BNP (last 3 results) No results for input(s): PROBNP in the last 8760 hours. HbA1C: No results for input(s): HGBA1C in the last 72 hours. CBG: Recent Labs  Lab 07/21/18 1136 07/21/18 1710 07/21/18 2158 07/22/18 0734 07/22/18 1137  GLUCAP 125* 203* 133* 131* 200*   Lipid Profile: No results for input(s): CHOL, HDL, LDLCALC, TRIG, CHOLHDL, LDLDIRECT in the last 72 hours. Thyroid Function Tests: No results for input(s): TSH, T4TOTAL, FREET4, T3FREE, THYROIDAB in the last 72 hours. Anemia Panel: No results for input(s): VITAMINB12, FOLATE, FERRITIN, TIBC, IRON, RETICCTPCT in the last 72 hours. Sepsis Labs: No results for input(s): PROCALCITON, LATICACIDVEN in the last 168 hours.  Recent Results (from the past 240 hour(s))  Novel Coronavirus,NAA,(SEND-OUT TO REF LAB - TAT 24-48 hrs); Hosp Order  Status: None   Collection Time: 07/15/18  4:48 PM   Specimen: Nasopharyngeal Swab; Respiratory  Result Value Ref Range Status   SARS-CoV-2, NAA NOT DETECTED NOT DETECTED Final    Comment: (NOTE) Testing was performed using the cobas(R) SARS-CoV-2 test. This test was developed and its performance characteristics determined by [employer]. This test has not been FDA cleared or approved. This test has been authorized by FDA under an Emergency Use Authorization (EUA). This test is only authorized for the duration of time the declaration that circumstances exist justifying the authorization of the emergency use of in vitro diagnostic tests for detection of SARS-CoV-2 virus and/or diagnosis of COVID-19 infection under section 564(b)(1) of the Act, 21 U.S.C. 569VXY-8(A)(1), unless the authorization is terminated or revoked sooner. When diagnostic testing is negative, the possibility of a false negative result should be considered in the context of a patient's recent exposures and the presence of clinical signs and symptoms consistent with COVID-19. An individual  without symptoms of COVID-19 and who is not shedding SARS-CoV-2 virus would expect to have  a negative (not detected) result in this assay. Performed At: [facility] [place], [place] [id number] Rush Farmer MD MB:[id number]    [place]  Final    Comment: Performed at [facility] [address], [place], [place] [address]  SARS Coronavirus 2 (CEPHEID - Performed in [place] hospital lab), Hosp Order     Status: None   Collection Time: 07/21/18  8:37 AM   Specimen: Nasopharyngeal Swab  Result Value Ref Range Status   SARS Coronavirus 2 NEGATIVE NEGATIVE Final    Comment: (NOTE) If result is NEGATIVE SARS-CoV-2 target nucleic acids are NOT DETECTED. The SARS-CoV-2 RNA is generally detectable in upper and lower  respiratory specimens during the acute phase of infection. The lowest  concentration of SARS-CoV-2 viral copies this assay can detect is 250  copies / mL. A negative result does not preclude SARS-CoV-2 infection  and should not be used as the sole basis for treatment or other  patient management decisions.  A negative result may occur with  improper specimen collection / handling, submission of specimen other  than nasopharyngeal swab, presence of viral mutation(s) within the  areas targeted by this assay, and inadequate number of viral copies  (<250 copies / mL). A negative result must be combined with clinical  observations, patient history, and epidemiological information. If result is POSITIVE SARS-CoV-2 target nucleic acids are DETECTED. The SARS-CoV-2 RNA is generally detectable in upper and lower  respiratory specimens dur ing the acute phase of infection.  Positive  results are indicative of active infection with SARS-CoV-2.  Clinical  correlation with patient history and other diagnostic information is  necessary to determine patient infection status.  Positive results do  not rule out  bacterial infection or co-infection with other viruses. If result is PRESUMPTIVE POSTIVE SARS-CoV-2 nucleic acids MAY BE PRESENT.   A presumptive positive result was obtained on the submitted specimen  and confirmed on repeat testing.  While 2019 novel coronavirus  (SARS-CoV-2) nucleic acids may be present in the submitted sample  additional confirmatory testing may be necessary for epidemiological  and / or clinical management purposes  to differentiate between  SARS-CoV-2 and other Sarbecovirus currently known to infect humans.  If clinically indicated additional testing with an alternate test  methodology [phone]) is advised. The SARS-CoV-2 RNA is generally  detectable in upper and lower respiratory sp ecimens during the  acute  phase of infection. The expected result is Negative. Fact Sheet for Patients:  StrictlyIdeas.no Fact Sheet for Healthcare Providers: BankingDealers.co.za This test is not yet approved or cleared by the [place] FDA and has been authorized for detection and/or diagnosis of SARS-CoV-2 by FDA under an Emergency Use Authorization (EUA).  This EUA will remain in effect (meaning this test can be used) for the duration of the COVID-19 declaration under Section 564(b)(1) of the Act, 21 U.S.C. section 360bbb-3(b)(1), unless the authorization is terminated or revoked sooner. Performed at [facility], [place] [address], [place], [place] [address]        Radiology Studies: No results found.    Scheduled Meds: . amLODipine  5 mg Oral Daily  . atorvastatin  40 mg Oral q1800  . carvedilol  12.5 mg Oral BID WC  . [START ON 07/26/2018] cloNIDine  0.2 mg Transdermal Weekly  . doxazosin  8 mg Oral QPM  . ferrous sulfate  325 mg Oral Q breakfast  . hydrALAZINE  100 mg Oral Q8H  . insulin aspart  0-5 Units Subcutaneous QHS  . insulin aspart  0-9 Units Subcutaneous TID WC  . isosorbide mononitrate   120 mg Oral Daily  . levothyroxine  100 mcg Oral QAC breakfast  . pantoprazole  40 mg Oral BID AC  . torsemide  40 mg Oral Daily   Continuous Infusions:    LOS: 2 days    Time spent: 35 minutes   Dessa Phi, DO [facility] [email or web address] 07/22/2018, 12:19 PM

## 2018-07-22 NOTE — TOC Initial Note (Addendum)
Transition of Care [facility]) - Initial/Assessment Note    Patient Details  Name: Leonard [name] MRN: [id number] Date of Birth: [birth year]  [facility] CM/SW Contact:    Servando Snare, LCSW Phone Number: 07/22/2018, 1:08 PM  Clinical Narrative:      Patient recently dc 6/12. Patient is from [employer]. LCSW made [facility] referral as requested by attending. Patient has daily radiation treatment and may not qualify. Facility would like to create a plan for patient and transfusions to reduce frequent hospital visits. LCSW followed up with Nathaniel Man regarding possible plan. Per attending radiology is trying to set up out patient transfusions, but has been unsuccessful due backlog of appointments and not being able to get patient in soon enough.       LCSW will continue to follow for dc needs.            1:48 PM Daily radiation is a barrier to [facility]. Last radiation appointment scheduled for 6/30. May be able to reconsider for LTACH if no radiation after 6/30.        Patient Goals and CMS Choice        Expected Discharge Plan and Services                                                Prior Living Arrangements/Services                       Activities of Daily Living Home Assistive Devices/Equipment: Wheelchair ADL Screening (condition at time of admission) Patient's cognitive ability adequate to safely complete daily activities?: Yes Is the patient deaf or have difficulty hearing?: No Does the patient have difficulty seeing, even when wearing glasses/contacts?: No Does the patient have difficulty concentrating, remembering, or making decisions?: No Patient able to express need for assistance with ADLs?: Yes Does the patient have difficulty dressing or bathing?: Yes Independently performs ADLs?: Yes (appropriate for developmental age) Does the patient have difficulty walking or climbing stairs?: Yes Weakness of Legs: Both Weakness of  Arms/Hands: None  Permission Sought/Granted                  Emotional Assessment              Admission diagnosis:  Upper GI bleed [K92.2] Patient Active Problem List   Diagnosis Date Noted  . Symptomatic anemia 07/20/2018  . Hematemesis/vomiting blood 07/15/2018  . Acute blood loss anemia 07/15/2018  . Chronic blood loss anemia   . Upper GI bleed 06/23/2018  . Acute renal failure superimposed on chronic kidney disease ([place])   . Abdominal pain   . DNR (do not resuscitate)   . 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[place])   . DNR (do not resuscitate) discussion   . Anemia 03/29/2018  . Acute GI bleeding 03/28/2018  . Dysphagia 10/01/2017  . Palliative care by specialist   . Severe comorbid illness   . Moderate malnutrition ([place]) 08/27/2017  . Hypoxemia   . Atelectasis   . Chronic respiratory failure with hypoxia ([place]) 08/25/2017  . Pulmonary artery hypertension ([place]) 07/25/2017  . Pressure injury of skin 07/20/2017  . Goals of care, counseling/discussion 02/11/2017  . Diabetic polyneuropathy associated with type 2 diabetes mellitus ([place]) 01/14/2017  . Pleural effusion   . Chronic anemia 02/28/2016  . Weakness generalized 01/21/2016  .  Diabetic retinopathy ([place]) 11/13/2015  . Iron deficiency anemia 08/03/2015  . Type II diabetes mellitus with complication ([place])   . Chronic kidney disease (CKD), stage III (moderate) (HCC)   . MGUS (monoclonal gammopathy of unknown significance) 01/15/2015  . Deficiency anemia 01/15/2015  . CKD (chronic kidney disease) stage 4, GFR 15-29 ml/min (HCC) 01/15/2015  . Hypothyroidism 11/22/2014  . COPD GOLD III  02/16/2014  . Anemia of renal disease   . Chronic diastolic CHF (congestive heart failure) ([place])   . History of tobacco use 08/23/2013  . 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 CAD (coronary artery disease) 04/06/2013  . PVD - hx of Rt SFA PTA and s/p Rt 4-5th toe amp Dec 2014 04/05/2013  . 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 Mixed  hyperlipidemia 07/17/2008   PCP:  Kathrene Alu, MD Pharmacy:  No Pharmacies Listed    Social Determinants of Health (SDOH) Interventions    Readmission Risk Interventions No flowsheet data found.

## 2018-07-22 NOTE — Progress Notes (Signed)
Daily Progress Note   [name]       Date: 07/22/2018 DOB: [birth year]  Age: 68 y.o. MRN#: [id number] Attending Physician: Dessa Phi, DO Primary Care Physician: Kathrene Alu, MD Admit Date: 07/20/2018  Reason for Consultation/Follow-up: Establishing goals of care  Subjective: I met again with Leonard [name] today.  He denies complaints today and remains invested in plan to continue with radiation.  Length of Stay: 2  Current Medications: Scheduled Meds:   amLODipine  5 mg Oral Daily   atorvastatin  40 mg Oral q1800   carvedilol  12.5 mg Oral BID WC   [START ON 07/26/2018] cloNIDine  0.2 mg Transdermal Weekly   doxazosin  8 mg Oral QPM   ferrous sulfate  325 mg Oral Q breakfast   hydrALAZINE  100 mg Oral Q8H   insulin aspart  0-5 Units Subcutaneous QHS   insulin aspart  0-9 Units Subcutaneous TID WC   isosorbide mononitrate  120 mg Oral Daily   levothyroxine  100 mcg Oral QAC breakfast   pantoprazole  40 mg Oral BID AC   torsemide  40 mg Oral Daily    Continuous Infusions:   PRN Meds: ondansetron **OR** ondansetron (ZOFRAN) IV  Physical Exam         General: Alert, awake, in no acute distress.  HEENT: No bruits, no goiter, no JVD Heart: Regular rate and rhythm. No murmur appreciated. Lungs: Good air movement, clear Abdomen: Soft, nontender, nondistended, positive bowel sounds.  Ext: No significant edema, L AKA Skin: Warm and dry Neuro: Grossly intact, nonfocal.   Vital Signs: BP (!) 146/62 (BP Location: Right Arm)    Pulse 65    Temp 98.3 F (36.8 C) (Oral)    Resp 16    Ht 5' 8" (1.727 m)    Wt 79.5 kg    SpO2 99%    BMI 26.65 kg/m  SpO2: SpO2: 99 % O2 Device: O2 Device: Room Air O2 Flow Rate:    Intake/output summary:    Intake/Output Summary (Last 24 hours) at 07/22/2018 1804 Last data filed at 07/22/2018 1400 Gross per 24 hour  Intake --  Output 1800 ml  Net -1800 ml   LBM: Last BM Date: (unsure) Baseline Weight: Weight: 78.2 kg Most recent weight: Weight: 79.5 kg  Palliative Assessment/Data:    Flowsheet Rows     Most Recent Value  Intake Tab  Referral Department  Hospitalist  Unit at Time of Referral  Med/Surg Unit  Palliative Care Primary Diagnosis  Cancer  Date Notified  07/21/18  Palliative Care Type  Return patient Palliative Care  Reason for referral  Clarify Goals of Care  Date of Admission  07/20/18  Date first seen by Palliative Care  07/21/18  # of days Palliative referral response time  0 Day(s)  # of days IP prior to Palliative referral  1  Clinical Assessment  Palliative Performance Scale Score  40%  Psychosocial & Spiritual Assessment  Palliative Care Outcomes  Patient/Family meeting held?  Yes  Who was at the meeting?  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) 08/27/2017   Hypoxemia    Atelectasis    Chronic respiratory failure with hypoxia ([place]) 08/25/2017   Pulmonary artery hypertension ([place]) 07/25/2017   Pressure injury of skin 07/20/2017   Goals of care, counseling/discussion 02/11/2017   Diabetic polyneuropathy associated with type 2 diabetes mellitus ([place])  01/14/2017   Pleural effusion    Chronic anemia 02/28/2016   Weakness generalized 01/21/2016   Diabetic retinopathy ([place]) 11/13/2015   Iron deficiency anemia 08/03/2015   Type II diabetes mellitus with complication (HCC)    Chronic kidney disease (CKD), stage III (moderate) (HCC)    MGUS (monoclonal gammopathy of unknown significance) 01/15/2015   Deficiency anemia 01/15/2015   CKD (chronic kidney disease) stage 4, GFR 15-29 ml/min (HCC) 01/15/2015   Hypothyroidism 11/22/2014   COPD GOLD III  02/16/2014   Anemia of renal disease    Chronic diastolic CHF (congestive heart failure) ([place])    History of tobacco use 08/23/2013   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 male  with past medical history of cancer of stomach, CAD, DM, PVD s/p L AKA, chronic anemia, CKD IV, COPD, HLP admitted on 07/20/2018 with weakness and low hgb.  He has had prior admissions for similar.  He was deemed not a candidate for surgical intervention and is undergoing curative dose radiation therapy.  He was seen by palliative care during prior hospitalizations and palliative care consulted to continue discussion on goals of care.   Recommendations/Plan:    Goals of Care and Additional Recommendations:  Limitations on Scope of Treatment: Full Scope Treatment  Code Status:    Code Status Orders  (From admission, onward)         Start     Ordered   07/21/18 0828  Full code  Continuous     07/21/18 0828        Code Status History    Date Active Date Inactive Code Status Order ID Comments User Context   07/20/2018 2125 07/21/2018 0828 Full Code [id number]  Elwyn Reach, MD Inpatient   07/15/2018 1754 07/16/2018 2202 Full Code [id number]  Darliss Cheney, MD  Inpatient   07/06/2018 1727 07/11/2018 1833 Full Code [id number]  Matilde Haymaker, MD ED   06/24/2018 0048 06/27/2018 2122 Full Code [id number]  Benay Pike, MD Inpatient   04/01/2018 1053 04/03/2018 1739 Full Code [id number]  Dellinger, Bobby Rumpf, PA-C Inpatient   04/01/2018 1049 04/01/2018 1053 Full Code [id number]  Patriciaann Clan, DO Inpatient   03/31/2018 1729 04/01/2018 1049 DNR [id number]  Fuller Canada, PA-C Inpatient   03/29/2018 0106 03/31/2018 1729 Full Code [id number]  Rise Patience, MD Inpatient   09/23/2017 1622 10/01/2017 2344 Full Code [id number]  Bufford Lope, DO Inpatient   08/25/2017 1454 08/30/2017 1930 Full Code [id number]  Sherene Sires, DO Inpatient   08/14/2017 1011 08/14/2017 2318 Partial Code [id number]  Sherene Sires, DO Inpatient   08/13/2017 2207 08/14/2017 1011 Full Code [id number]  Matilde Haymaker, MD Inpatient   08/13/2017 1546 08/13/2017 2207 DNR [id number]  Daisy Floro, DO Inpatient   08/13/2017 1434 08/13/2017 1545 DNR [id number]  Daisy Floro, DO ED   07/19/2017 2342 08/07/2017 1847 Partial Code [id number]  Omar Person, NP Inpatient   07/05/2017 1253 07/19/2017 2342 Full Code [id number]  Bufford Lope, DO ED   03/12/2017 2004 03/18/2017 1814 Full Code [id number]  Conrad Lilly, MD Inpatient   12/01/2016 2050 12/04/2016 1213 Full Code [id number]  Mercy Riding, MD ED   03/22/2016 0119 03/24/2016 1639 Full Code [id number]  Rogue Bussing, MD Inpatient   01/22/2016 0021 01/24/2016 1657 Full Code [id number]  Steve Rattler, DO ED   01/14/2016 0330 01/18/2016 1456 Full Code [id number]  Verner Mould, MD Inpatient   08/27/2015 1501 09/02/2015 1730 Full Code [id number]  Bufford Lope, DO Inpatient   08/07/2015 0136 08/09/2015 2136 Full Code [id number]  Lorna Few, DO ED   06/28/2015 0717 07/02/2015 1750 Full Code [id number]  Sela Hua, MD Inpatient   06/18/2015 1616 06/22/2015 2218 Full Code [id number]  Aquilla Hacker, MD Inpatient   06/03/2015 0531 06/07/2015 2048  Full Code [id number]  Mercy Riding, MD ED   02/10/2014 1452 02/13/2014 2053 Full Code [id number]  Leone Brand, MD Inpatient   12/09/2013 0128 12/12/2013 2150 Full Code [id number]  Coral Spikes, DO ED   04/07/2013 1243 04/20/2013 1701 Full Code [id number]  John Giovanni, PA-C Inpatient   04/05/2013 1537 04/07/2013 1243 Full Code [id number]  [place], Peter M, MD Inpatient   04/04/2013 0935 04/05/2013 1537 Full Code [id number]  Coral Spikes, DO Inpatient   01/25/2013 1617 02/01/2013 2234 Full Code [id number]  Rosemarie Ax, MD Inpatient   01/13/2013 1317 01/14/2013 1426 Full Code [id number]  Conrad Arapahoe, MD Inpatient   01/04/2013 2227 01/07/2013 2005 Full Code [id number]  Angelica Ran, MD Inpatient   Advance Care Planning Activity    Advance Directive Documentation     Most Recent Value  Type of Advance Directive  Healthcare Power of Attorney  Pre-existing out of facility DNR order (yellow form or pink MOST form)  --  "MOST" Form in Place?  --    - Full code/Full Scope treatment - Mr. Harkleroad continues with his  radiation treatments with the hope that these will stop his recurrent bleeding. - He is anxious to return to skilled facility as soon as possible.  He is tired of being in quarantine there and wants to restart his quarantine as soon as possible.  Prognosis:   Unable to determine  Discharge Planning:  SNF  Care plan was discussed with patient  Thank you for allowing the Palliative Medicine Team to assist in the care of this patient.   Total Time 20 Prolonged Time Billed No      Greater than 50%  of this time was spent counseling and coordinating care related to the above assessment and plan.  Micheline Rough, MD  Please contact Palliative Medicine Team phone at [phone] for questions and concerns.

## 2018-07-23 ENCOUNTER — Other Ambulatory Visit: Payer: Self-pay | Admitting: *Deleted

## 2018-07-23 ENCOUNTER — Ambulatory Visit
Admission: RE | Admit: 2018-07-23 | Discharge: 2018-07-23 | Disposition: A | Discharge status: Home / Self Care | Payer: Medicare Other | Source: Ambulatory Visit | Attending: Radiation Oncology | Admitting: Radiation Oncology

## 2018-07-23 ENCOUNTER — Telehealth: Payer: Self-pay | Admitting: Hematology and Oncology

## 2018-07-23 DIAGNOSIS — D002 Carcinoma in situ of stomach: Secondary | ICD-10-CM | POA: Diagnosis not present

## 2018-07-23 DIAGNOSIS — Z89612 Acquired absence of left leg above knee: Secondary | ICD-10-CM | POA: Diagnosis not present

## 2018-07-23 DIAGNOSIS — E088 Diabetes mellitus due to underlying condition with unspecified complications: Secondary | ICD-10-CM | POA: Diagnosis present

## 2018-07-23 DIAGNOSIS — C170 Malignant neoplasm of duodenum: Secondary | ICD-10-CM | POA: Diagnosis present

## 2018-07-23 DIAGNOSIS — N184 Chronic kidney disease, stage 4 (severe): Secondary | ICD-10-CM | POA: Diagnosis not present

## 2018-07-23 DIAGNOSIS — M6281 Muscle weakness (generalized): Secondary | ICD-10-CM | POA: Diagnosis present

## 2018-07-23 DIAGNOSIS — Z87891 Personal history of nicotine dependence: Secondary | ICD-10-CM | POA: Diagnosis not present

## 2018-07-23 DIAGNOSIS — R58 Hemorrhage, not elsewhere classified: Secondary | ICD-10-CM | POA: Diagnosis not present

## 2018-07-23 DIAGNOSIS — J449 Chronic obstructive pulmonary disease, unspecified: Secondary | ICD-10-CM | POA: Diagnosis not present

## 2018-07-23 DIAGNOSIS — D500 Iron deficiency anemia secondary to blood loss (chronic): Secondary | ICD-10-CM | POA: Diagnosis not present

## 2018-07-23 DIAGNOSIS — R41841 Cognitive communication deficit: Secondary | ICD-10-CM | POA: Diagnosis present

## 2018-07-23 DIAGNOSIS — R1312 Dysphagia, oropharyngeal phase: Secondary | ICD-10-CM | POA: Diagnosis present

## 2018-07-23 DIAGNOSIS — I2721 Secondary pulmonary arterial hypertension: Secondary | ICD-10-CM | POA: Diagnosis not present

## 2018-07-23 DIAGNOSIS — E7800 Pure hypercholesterolemia, unspecified: Secondary | ICD-10-CM | POA: Diagnosis not present

## 2018-07-23 DIAGNOSIS — Z510 Encounter for antineoplastic radiation therapy: Secondary | ICD-10-CM | POA: Diagnosis not present

## 2018-07-23 DIAGNOSIS — Z79899 Other long term (current) drug therapy: Secondary | ICD-10-CM | POA: Diagnosis not present

## 2018-07-23 DIAGNOSIS — D509 Iron deficiency anemia, unspecified: Secondary | ICD-10-CM | POA: Diagnosis not present

## 2018-07-23 DIAGNOSIS — D631 Anemia in chronic kidney disease: Secondary | ICD-10-CM | POA: Diagnosis not present

## 2018-07-23 DIAGNOSIS — Z7401 Bed confinement status: Secondary | ICD-10-CM | POA: Diagnosis not present

## 2018-07-23 DIAGNOSIS — Z801 Family history of malignant neoplasm of trachea, bronchus and lung: Secondary | ICD-10-CM | POA: Diagnosis not present

## 2018-07-23 DIAGNOSIS — C163 Malignant neoplasm of pyloric antrum: Secondary | ICD-10-CM | POA: Diagnosis not present

## 2018-07-23 DIAGNOSIS — I5032 Chronic diastolic (congestive) heart failure: Secondary | ICD-10-CM | POA: Diagnosis not present

## 2018-07-23 DIAGNOSIS — E1151 Type 2 diabetes mellitus with diabetic peripheral angiopathy without gangrene: Secondary | ICD-10-CM | POA: Diagnosis not present

## 2018-07-23 DIAGNOSIS — E1122 Type 2 diabetes mellitus with diabetic chronic kidney disease: Secondary | ICD-10-CM | POA: Diagnosis not present

## 2018-07-23 DIAGNOSIS — C801 Malignant (primary) neoplasm, unspecified: Secondary | ICD-10-CM | POA: Diagnosis not present

## 2018-07-23 DIAGNOSIS — M2550 Pain in unspecified joint: Secondary | ICD-10-CM | POA: Diagnosis not present

## 2018-07-23 DIAGNOSIS — I129 Hypertensive chronic kidney disease with stage 1 through stage 4 chronic kidney disease, or unspecified chronic kidney disease: Secondary | ICD-10-CM | POA: Diagnosis not present

## 2018-07-23 DIAGNOSIS — Z86718 Personal history of other venous thrombosis and embolism: Secondary | ICD-10-CM | POA: Diagnosis not present

## 2018-07-23 DIAGNOSIS — D649 Anemia, unspecified: Secondary | ICD-10-CM | POA: Diagnosis not present

## 2018-07-23 DIAGNOSIS — I130 Hypertensive heart and chronic kidney disease with heart failure and stage 1 through stage 4 chronic kidney disease, or unspecified chronic kidney disease: Secondary | ICD-10-CM | POA: Diagnosis not present

## 2018-07-23 DIAGNOSIS — I2510 Atherosclerotic heart disease of native coronary artery without angina pectoris: Secondary | ICD-10-CM | POA: Diagnosis not present

## 2018-07-23 DIAGNOSIS — Z7901 Long term (current) use of anticoagulants: Secondary | ICD-10-CM | POA: Diagnosis not present

## 2018-07-23 LAB — GLUCOSE, CAPILLARY
Glucose-Capillary: 128 mg/dL — ABNORMAL HIGH (ref 70–99)
Glucose-Capillary: 160 mg/dL — ABNORMAL HIGH (ref 70–99)

## 2018-07-23 LAB — CBC
HCT: 24.8 % — ABNORMAL LOW (ref 39.0–52.0)
Hemoglobin: 7.9 g/dL — ABNORMAL LOW (ref 13.0–17.0)
MCH: 29.4 pg (ref 26.0–34.0)
MCHC: 31.9 g/dL (ref 30.0–36.0)
MCV: 92.2 fL (ref 80.0–100.0)
Platelets: 156 10*3/uL (ref 150–400)
RBC: 2.69 MIL/uL — ABNORMAL LOW (ref 4.22–5.81)
RDW: 14.6 % (ref 11.5–15.5)
WBC: 6.2 10*3/uL (ref 4.0–10.5)
nRBC: 0 % (ref 0.0–0.2)

## 2018-07-23 LAB — PREPARE RBC (CROSSMATCH)

## 2018-07-23 MED ORDER — SODIUM CHLORIDE 0.9% IV SOLUTION
Freq: Once | INTRAVENOUS | Status: AC
  Administered 2018-07-23: 10:00:00 via INTRAVENOUS

## 2018-07-23 MED ORDER — SODIUM CHLORIDE 0.9% IV SOLUTION
250.0000 mL | Freq: Once | INTRAVENOUS | Status: DC
  Filled 2018-07-23: qty 250

## 2018-07-23 MED ORDER — SODIUM CHLORIDE 0.9% IV SOLUTION
250.0000 mL | Freq: Once | INTRAVENOUS | Status: AC
  Filled 2018-07-23: qty 250

## 2018-07-23 NOTE — Telephone Encounter (Signed)
Per 6/19 schedule message request from radiation oncology added blood for 6/22 at 11 am after xrt. Spoke with Mardene Celeste at [facility] [phone]).

## 2018-07-23 NOTE — Progress Notes (Signed)
Patient has discharged to SNF [facility] on 07/23/2018. Discharge instruction including medication and appointment was sent in package with [facility]. RN called to give a report to [employer] at 1315. No question at this time. PT[facility] will transfer patient to SNF after 1 unit of RBC was done.

## 2018-07-23 NOTE — Discharge Summary (Signed)
Physician Discharge Summary  LAURENT CARGILE EXB:[id number] DOB: 02/21/1950 DOA: 07/20/2018  PCP: Kathrene Alu, MD  Admit date: 07/20/2018 Discharge date: 07/23/2018  Admitted From: SNF Disposition:  SNF   Recommendations for Outpatient Follow-up:  1. Follow up with Radiation Oncology daily for tx as scheduled. Scheduled for blood transfusion on 6/22 as well 2. Please obtain CBC in 1 week   Discharge Condition: Stable CODE STATUS: Full  Diet recommendation: Mechanical soft diet. Patient needs to be on a soft mechanical diet, not a regular diet which may contribute to the friability of his situation with recurrent bleeding from his gastric cancer  Brief/Interim Summary: Leonard Nesbit Poseyis a 68 y.o.malewith medical history significant ofcancer of the stomach, diabetes, coronary artery disease, peripheral vascular disease status post left AKA, chronic anemia, chronic kidney disease stage IV, COPD and hyperlipidemia with ongoing tobacco abuse who is undergoing radiation therapy now. Patient was called at home and told that his hemoglobin is down to 5.9. He has been feeling weak. He denied any shortness of breath. Occasional exertional dyspnea. He was seen in the ER and hemoglobin is down to 5.5. Patient has guaiac positive stool but not dark. Denied any bright red blood per rectum. Suspected slow GI bleed from his gastric cancer and is being admitted for treatment.  He was transfused 2 units packed red blood cells on admission.  Discussed with GI briefly, they recommend supportive care with as needed transfusions.  Discharge Diagnoses:  Principal Problem:   Symptomatic anemia Active Problems:   CAD (coronary artery disease)   Anemia of renal disease   COPD GOLD III    CKD (chronic kidney disease) stage 4, GFR 15-29 ml/min (HCC)   Type II diabetes mellitus with complication (HCC)   Iron deficiency anemia   Acute GI bleeding   Anemia   Malignant neoplasm of pyloric antrum  (HCC)   Symptomatic anemia due to chronic blood loss -Patient is transfused 2 units packed red blood cells on admission -Hemoglobin this morning 7.9 -Discussed with GI, they recommend PRN transfusion.  No procedures planned unless clinical change -Patient has had numerous ER visits as well as hospitalizations due to his anemia and blood loss.  He is not currently a candidate for LTACH due to daily radiation treatments.  We will give 1 additional unit of blood today, scheduled for outpatient blood transfusion on 6/22.  Hopefully, with radiation treatment, his GI bleed will slow down.  Cancer of the pyloric antrum -Currently undergoing radiation treatment -Patient needs to be on a soft mechanical diet, not a regular diet which may contribute to the friability of his situation with recurrent bleeding from his gastric cancer -Palliative care consult, patient continues full scope of care   Type 2 diabetes -Sliding scale insulin  Coronary artery disease -Stable.  Continue Imdur  Essential hypertension -Continue Norvasc, Coreg, Catapres, hydralazine, Demadex  Hyperlipidemia -Continue Lipitor  Chronic kidney disease stage IV -Baseline creatinine around 3.1 -Stable  Peripheral vascular disease -Status post left AKA  Hypothyroidism -Continue Synthroid  Discharge Instructions  Discharge Instructions    Increase activity slowly   Complete by: As directed      Allergies as of 07/23/2018      Reactions   Penicillins Other (See Comments)   Has patient had a PCN reaction causing immediate rash, facial/tongue/throat swelling, SOB or lightheadedness with hypotension: Unknown Has patient had a PCN reaction causing severe rash involving mucus membranes or skin necrosis: Unknown Has patient had a PCN reaction that  required hospitalization: Unknown Has patient had a PCN reaction occurring within the last 10 years: Unknown If all of the above answers are "NO", then may proceed with  Cephalosporin use.      Medication List    TAKE these medications   acetaminophen 325 MG tablet Commonly known as: TYLENOL Take 650 mg by mouth every 6 (six) hours as needed for fever.   amLODipine 5 MG tablet Commonly known as: NORVASC Take 1 tablet (5 mg total) by mouth daily.   atorvastatin 40 MG tablet Commonly known as: LIPITOR TAKE ONE (1) TABLET BY MOUTH EVERY DAY AT 6:00PM What changed: See the new instructions.   carvedilol 12.5 MG tablet Commonly known as: COREG Take 1 tablet (12.5 mg total) by mouth 2 (two) times daily with a meal.   cholecalciferol 25 MCG (1000 UT) tablet Commonly known as: VITAMIN D Take 2,000 Units by mouth 3 (three) times daily.   cloNIDine 0.2 mg/24hr patch Commonly known as: CATAPRES - Dosed in mg/24 hr Place 1 patch (0.2 mg total) onto the skin once a week.   Darbepoetin Alfa 40 MCG/0.4ML Sosy injection Commonly known as: ARANESP Inject 0.4 mLs (40 mcg total) into the skin every 30 (thirty) days.   doxazosin 8 MG tablet Commonly known as: CARDURA Take 1 tablet (8 mg total) by mouth every evening. What changed: when to take this   feeding supplement (ENSURE ENLIVE) Liqd Take 237 mLs by mouth 3 (three) times daily between meals.   ferrous sulfate 325 (65 FE) MG tablet Commonly known as: FeroSul Take 1 tablet (325 mg total) by mouth daily at 12 noon. What changed: when to take this   glucose blood test strip Commonly known as: OneTouch Verio Use as instructed to test three times daily. ICD-10 code: E11.22   hydrALAZINE 100 MG tablet Commonly known as: APRESOLINE TAKE ONE TABLET BY MOUTH EVERY EIGHT HOURS What changed: See the new instructions.   insulin aspart 100 UNIT/ML injection Commonly known as: novoLOG Inject 0-10 Units into the skin 3 (three) times daily before meals. <150= 0 units, 151-200= 2 units, 201-250= 4 units, 251-300= 6 units, 301-350= 8 units, 351-400= 10 units   ipratropium-albuterol 0.5-2.5 (3) MG/3ML  Soln Commonly known as: DUONEB Take 3 mLs by nebulization every 6 (six) hours as needed (sob).   isosorbide mononitrate 120 MG 24 hr tablet Commonly known as: IMDUR Take 1 tablet (120 mg total) by mouth daily.   levothyroxine 100 MCG tablet Commonly known as: SYNTHROID Take 100 mcg by mouth daily before breakfast.   Lonhala Magnair Starter Kit 25 MCG/ML Soln Generic drug: Glycopyrrolate Inhale 1 mL into the lungs 2 (two) times a day.   nicotine 14 mg/24hr patch Commonly known as: NICODERM CQ - dosed in mg/24 hours Place 14 mg onto the skin daily.   nitroGLYCERIN 0.4 MG SL tablet Commonly known as: NITROSTAT Place 1 tablet (0.4 mg total) under the tongue every 5 (five) minutes as needed for chest pain.   ondansetron 4 MG tablet Commonly known as: ZOFRAN Take 4 mg by mouth every 8 (eight) hours as needed for nausea or vomiting.   OneTouch Delica Lancets 46T Misc 1 each by Does not apply route 3 (three) times daily. ICD-10 code: E11.22   OneTouch Verio Flex System w/Device Kit 1 kit by Does not apply route daily.   pantoprazole 40 MG tablet Commonly known as: PROTONIX Take 1 tablet (40 mg total) by mouth 2 (two) times daily.   polyethylene glycol  17 g packet Commonly known as: MIRALAX / GLYCOLAX Take 17 g by mouth daily as needed for mild constipation or moderate constipation.   torsemide 20 MG tablet Commonly known as: DEMADEX Take 2 tablets (40 mg total) by mouth daily.   umeclidinium bromide 62.5 MCG/INH Aepb Commonly known as: INCRUSE ELLIPTA Inhale 1 puff into the lungs daily.      Follow-up Information    Daily radiation oncology treatment Follow up.          Allergies  Allergen Reactions  . Penicillins Other (See Comments)    Has patient had a PCN reaction causing immediate rash, facial/tongue/throat swelling, SOB or lightheadedness with hypotension: Unknown Has patient had a PCN reaction causing severe rash involving mucus membranes or skin necrosis:  Unknown Has patient had a PCN reaction that required hospitalization: Unknown Has patient had a PCN reaction occurring within the last 10 years: Unknown If all of the above answers are "NO", then may proceed with Cephalosporin use.     Consultations:  Palliative care    Procedures/Studies: None      Discharge Exam: Vitals:   07/23/18 0800 07/23/18 1057  BP:  (!) 126/55  Pulse: 62 61  Resp:  16  Temp:  98 F (36.7 C)  SpO2:  99%    General: Pt is alert, awake, not in acute distress Cardiovascular: RRR, S1/S2 +, no rubs, no gallops Respiratory: CTA bilaterally, no wheezing, no rhonchi Abdominal: Soft, NT, ND, bowel sounds + Extremities: no edema, no cyanosis    The results of significant diagnostics from this hospitalization (including imaging, microbiology, ancillary and laboratory) are listed below for reference.     Microbiology: Recent Results (from the past 240 hour(s))  Novel Coronavirus,NAA,(SEND-OUT TO REF LAB - TAT 24-48 hrs); Hosp Order     Status: None   Collection Time: 07/15/18  4:48 PM   Specimen: Nasopharyngeal Swab; Respiratory  Result Value Ref Range Status   SARS-CoV-2, NAA NOT DETECTED NOT DETECTED Final    Comment: (NOTE) Testing was performed using the cobas(R) SARS-CoV-2 test. This test was developed and its performance characteristics determined by [employer] and [employer]. This test has not been FDA cleared or approved. This test has been authorized by FDA under an Emergency Use Authorization (EUA). This test is only authorized for the duration of time the declaration that circumstances exist justifying the authorization of the emergency use of in vitro diagnostic tests for detection of SARS-CoV-2 virus and/or diagnosis of COVID-19 infection under section 564(b)(1) of the Act, 21 U.S.C. 832PQD-8(Y)(6), unless the authorization is terminated or revoked sooner. When diagnostic testing is negative, the possibility of a false negative result  should be considered in the context of a patient's recent exposures and the presence of clinical signs and symptoms consistent with COVID-19. An individual without symptoms of COVID-19 and who is not shedding SARS-CoV-2 virus would expect to have  a negative (not detected) result in this assay. Performed At: [facility] [place], [place] [id number] Rush Farmer MD HW:[id number]    [place]  Final    Comment: Performed at [facility] [address], [place], [place] [address]  SARS Coronavirus 2 (CEPHEID - Performed in [place] hospital lab), Hosp Order     Status: None   Collection Time: 07/21/18  8:37 AM   Specimen: Nasopharyngeal Swab  Result Value Ref Range Status   SARS Coronavirus 2 NEGATIVE NEGATIVE Final    Comment: (NOTE) If result is NEGATIVE SARS-CoV-2  target nucleic acids are NOT DETECTED. The SARS-CoV-2 RNA is generally detectable in upper and lower  respiratory specimens during the acute phase of infection. The lowest  concentration of SARS-CoV-2 viral copies this assay can detect is 250  copies / mL. A negative result does not preclude SARS-CoV-2 infection  and should not be used as the sole basis for treatment or other  patient management decisions.  A negative result may occur with  improper specimen collection / handling, submission of specimen other  than nasopharyngeal swab, presence of viral mutation(s) within the  areas targeted by this assay, and inadequate number of viral copies  (<250 copies / mL). A negative result must be combined with clinical  observations, patient history, and epidemiological information. If result is POSITIVE SARS-CoV-2 target nucleic acids are DETECTED. The SARS-CoV-2 RNA is generally detectable in upper and lower  respiratory specimens dur ing the acute phase of infection.  Positive  results are indicative of active infection with SARS-CoV-2.  Clinical   correlation with patient history and other diagnostic information is  necessary to determine patient infection status.  Positive results do  not rule out bacterial infection or co-infection with other viruses. If result is PRESUMPTIVE POSTIVE SARS-CoV-2 nucleic acids MAY BE PRESENT.   A presumptive positive result was obtained on the submitted specimen  and confirmed on repeat testing.  While 2019 novel coronavirus  (SARS-CoV-2) nucleic acids may be present in the submitted sample  additional confirmatory testing may be necessary for epidemiological  and / or clinical management purposes  to differentiate between  SARS-CoV-2 and other Sarbecovirus currently known to infect humans.  If clinically indicated additional testing with an alternate test  methodology [phone]) is advised. The SARS-CoV-2 RNA is generally  detectable in upper and lower respiratory sp ecimens during the acute  phase of infection. The expected result is Negative. Fact Sheet for Patients:  StrictlyIdeas.no Fact Sheet for Healthcare Providers: BankingDealers.co.za This test is not yet approved or cleared by the [place] FDA and has been authorized for detection and/or diagnosis of SARS-CoV-2 by FDA under an Emergency Use Authorization (EUA).  This EUA will remain in effect (meaning this test can be used) for the duration of the COVID-19 declaration under Section 564(b)(1) of the Act, 21 U.S.C. section 360bbb-3(b)(1), unless the authorization is terminated or revoked sooner. Performed at [facility], [place] [address], [place], [place] [address]      Labs: BNP (last 3 results) Recent Labs    12/03/17 1011 03/28/18 1758 06/23/18 2039  BNP 436.1* 171.7* 193.7*   Basic Metabolic Panel: Recent Labs  Lab 07/20/18 1748 07/21/18 0809 07/22/18 0541  NA 140 145 141  K 3.7 3.8 3.7  CL 111 118* 114*  CO2 20* 18* 19*  GLUCOSE 286* 162* 140*   BUN 61* 55* 49*  CREATININE 2.63* 2.34* 2.34*  CALCIUM 8.0* 7.9* 8.3*   Liver Function Tests: Recent Labs  Lab 07/20/18 1748 07/21/18 0809  AST 13* 12*  ALT 11 10  ALKPHOS 81 74  BILITOT 0.3 0.2*  PROT 5.9* 5.7*  ALBUMIN 3.1* 3.1*   No results for input(s): LIPASE, AMYLASE in the last 168 hours. No results for input(s): AMMONIA in the last 168 hours. CBC: Recent Labs  Lab 07/20/18 1748 07/20/18 2140 07/21/18 0809 07/22/18 0541 07/23/18 0534  WBC 4.9 4.8 5.8 5.6 6.2  NEUTROABS  --  3.4 4.2 4.2  --   HGB 5.5* 6.0* 7.7* 7.9* 7.9*  HCT 17.1* 19.2*  23.5* 24.6* 24.8*  MCV 95.0 96.5 93.3 92.1 92.2  PLT 184 179 154 156 156   Cardiac Enzymes: No results for input(s): CKTOTAL, CKMB, CKMBINDEX, TROPONINI in the last 168 hours. BNP: Invalid input(s): POCBNP CBG: Recent Labs  Lab 07/22/18 0734 07/22/18 1137 07/22/18 1537 07/22/18 2100 07/23/18 0822  GLUCAP 131* 200* 158* 123* 128*   D-Dimer No results for input(s): DDIMER in the last 72 hours. Hgb A1c No results for input(s): HGBA1C in the last 72 hours. Lipid Profile No results for input(s): CHOL, HDL, LDLCALC, TRIG, CHOLHDL, LDLDIRECT in the last 72 hours. Thyroid function studies No results for input(s): TSH, T4TOTAL, T3FREE, THYROIDAB in the last 72 hours.  Invalid input(s): FREET3 Anemia work up No results for input(s): VITAMINB12, FOLATE, FERRITIN, TIBC, IRON, RETICCTPCT in the last 72 hours. Urinalysis    Component Value Date/Time   COLORURINE YELLOW 06/24/2018 0628   APPEARANCEUR CLOUDY (A) 06/24/2018 0628   LABSPEC 1.012 06/24/2018 0628   PHURINE 7.0 06/24/2018 0628   GLUCOSEU NEGATIVE 06/24/2018 0628   HGBUR NEGATIVE 06/24/2018 0628   BILIRUBINUR NEGATIVE 06/24/2018 0628   BILIRUBINUR NEG 06/26/2015 1540   KETONESUR NEGATIVE 06/24/2018 0628   PROTEINUR 100 (A) 06/24/2018 0628   UROBILINOGEN 1.0 06/26/2015 1540   UROBILINOGEN 0.2 October 30, 202015 2156   NITRITE NEGATIVE 06/24/2018 0628   LEUKOCYTESUR  LARGE (A) 06/24/2018 0628   Sepsis Labs Invalid input(s): PROCALCITONIN,  WBC,  LACTICIDVEN Microbiology Recent Results (from the past 240 hour(s))  Novel Coronavirus,NAA,(SEND-OUT TO REF LAB - TAT 24-48 hrs); Hosp Order     Status: None   Collection Time: 07/15/18  4:48 PM   Specimen: Nasopharyngeal Swab; Respiratory  Result Value Ref Range Status   SARS-CoV-2, NAA NOT DETECTED NOT DETECTED Final    Comment: (NOTE) Testing was performed using the cobas(R) SARS-CoV-2 test. This test was developed and its performance characteristics determined by [employer], [employer] and [employer]. This test has not been FDA cleared or approved. This test has been authorized by FDA under an Emergency Use Authorization (EUA). This test is only authorized for the duration of time the declaration that circumstances exist justifying the authorization of the emergency use of in vitro diagnostic tests for detection of SARS-CoV-2 virus and/or diagnosis of COVID-19 infection under section 564(b)(1) of the Act, 21 U.S.C. 808UPJ-0(R)(1), unless the authorization is terminated or revoked sooner. When diagnostic testing is negative, the possibility of a false negative result should be considered in the context of a patient's recent exposures and the presence of clinical signs and symptoms consistent with COVID-19. An individual without symptoms of COVID-19 and who is not shedding SARS-CoV-2 virus would expect to have  a negative (not detected) result in this assay. Performed At: [facility] [place], [place] [id number] Rush Farmer MD WK:[id number]    [place]  Final    Comment: Performed at [facility] [address], [place], [place] [address]  SARS Coronavirus 2 (CEPHEID - Performed in [place] hospital lab), Hosp Order     Status: None   Collection Time: 07/21/18  8:37 AM   Specimen: Nasopharyngeal Swab  Result Value Ref Range Status   SARS  Coronavirus 2 NEGATIVE NEGATIVE Final    Comment: (NOTE) If result is NEGATIVE SARS-CoV-2 target nucleic acids are NOT DETECTED. The SARS-CoV-2 RNA is generally detectable in upper and lower  respiratory specimens during the acute phase of infection. The lowest  concentration of SARS-CoV-2 viral copies this assay can detect is 250  copies / mL. A negative result does not preclude SARS-CoV-2 infection  and should not be used as the sole basis for treatment or other  patient management decisions.  A negative result may occur with  improper specimen collection / handling, submission of specimen other  than nasopharyngeal swab, presence of viral mutation(s) within the  areas targeted by this assay, and inadequate number of viral copies  (<250 copies / mL). A negative result must be combined with clinical  observations, patient history, and epidemiological information. If result is POSITIVE SARS-CoV-2 target nucleic acids are DETECTED. The SARS-CoV-2 RNA is generally detectable in upper and lower  respiratory specimens dur ing the acute phase of infection.  Positive  results are indicative of active infection with SARS-CoV-2.  Clinical  correlation with patient history and other diagnostic information is  necessary to determine patient infection status.  Positive results do  not rule out bacterial infection or co-infection with other viruses. If result is PRESUMPTIVE POSTIVE SARS-CoV-2 nucleic acids MAY BE PRESENT.   A presumptive positive result was obtained on the submitted specimen  and confirmed on repeat testing.  While 2019 novel coronavirus  (SARS-CoV-2) nucleic acids may be present in the submitted sample  additional confirmatory testing may be necessary for epidemiological  and / or clinical management purposes  to differentiate between  SARS-CoV-2 and other Sarbecovirus currently known to infect humans.  If clinically indicated additional testing with an alternate test   methodology [phone]) is advised. The SARS-CoV-2 RNA is generally  detectable in upper and lower respiratory sp ecimens during the acute  phase of infection. The expected result is Negative. Fact Sheet for Patients:  StrictlyIdeas.no Fact Sheet for Healthcare Providers: BankingDealers.co.za This test is not yet approved or cleared by the [place] FDA and has been authorized for detection and/or diagnosis of SARS-CoV-2 by FDA under an Emergency Use Authorization (EUA).  This EUA will remain in effect (meaning this test can be used) for the duration of the COVID-19 declaration under Section 564(b)(1) of the Act, 21 U.S.C. section 360bbb-3(b)(1), unless the authorization is terminated or revoked sooner. Performed at [facility], [place] [address], [place], [place] [address]      Patient was seen and examined on the day of discharge and was found to be in stable condition. Time coordinating discharge: 45 minutes including assessment and coordination of care, as well as examination of the patient.   SIGNED:  Dessa Phi, DO [facility] [email or web address] 07/23/2018, 11:19 AM

## 2018-07-24 LAB — TYPE AND SCREEN
ABO/RH(D): B NEG
Antibody Screen: NEGATIVE
Unit division: 0
Unit division: 0
Unit division: 0
Unit division: 0

## 2018-07-24 LAB — BPAM RBC
Blood Product Expiration Date: 202007072359
Blood Product Expiration Date: 202007102359
Blood Product Expiration Date: 202007122359
Blood Product Expiration Date: 202007132359
ISSUE DATE / TIME: 202006162306
ISSUE DATE / TIME: 202006170223
ISSUE DATE / TIME: 202006191103
ISSUE DATE / TIME: 202006191528
Unit Type and Rh: 1700
Unit Type and Rh: 1700
Unit Type and Rh: 1700
Unit Type and Rh: 9500

## 2018-07-26 ENCOUNTER — Inpatient Hospital Stay: Payer: No Typology Code available for payment source

## 2018-07-26 ENCOUNTER — Other Ambulatory Visit: Payer: Self-pay

## 2018-07-26 ENCOUNTER — Ambulatory Visit
Admission: RE | Admit: 2018-07-26 | Discharge: 2018-07-26 | Disposition: A | Discharge status: Home / Self Care | Payer: Medicare Other | Source: Ambulatory Visit | Attending: Radiation Oncology | Admitting: Radiation Oncology

## 2018-07-26 ENCOUNTER — Telehealth: Payer: Self-pay | Admitting: Radiation Oncology

## 2018-07-26 ENCOUNTER — Other Ambulatory Visit: Payer: Self-pay | Admitting: Radiation Oncology

## 2018-07-26 ENCOUNTER — Telehealth: Payer: Self-pay | Admitting: *Deleted

## 2018-07-26 DIAGNOSIS — I130 Hypertensive heart and chronic kidney disease with heart failure and stage 1 through stage 4 chronic kidney disease, or unspecified chronic kidney disease: Secondary | ICD-10-CM | POA: Diagnosis not present

## 2018-07-26 DIAGNOSIS — D649 Anemia, unspecified: Secondary | ICD-10-CM | POA: Diagnosis not present

## 2018-07-26 DIAGNOSIS — C163 Malignant neoplasm of pyloric antrum: Secondary | ICD-10-CM | POA: Diagnosis not present

## 2018-07-26 DIAGNOSIS — D500 Iron deficiency anemia secondary to blood loss (chronic): Secondary | ICD-10-CM | POA: Diagnosis not present

## 2018-07-26 DIAGNOSIS — D002 Carcinoma in situ of stomach: Secondary | ICD-10-CM | POA: Diagnosis not present

## 2018-07-26 DIAGNOSIS — C801 Malignant (primary) neoplasm, unspecified: Secondary | ICD-10-CM | POA: Diagnosis not present

## 2018-07-26 DIAGNOSIS — Z510 Encounter for antineoplastic radiation therapy: Secondary | ICD-10-CM | POA: Diagnosis not present

## 2018-07-26 DIAGNOSIS — I5032 Chronic diastolic (congestive) heart failure: Secondary | ICD-10-CM | POA: Diagnosis not present

## 2018-07-26 DIAGNOSIS — N184 Chronic kidney disease, stage 4 (severe): Secondary | ICD-10-CM | POA: Diagnosis not present

## 2018-07-26 LAB — CBC (CANCER CENTER ONLY)
HCT: 27.3 % — ABNORMAL LOW (ref 39.0–52.0)
Hemoglobin: 8.7 g/dL — ABNORMAL LOW (ref 13.0–17.0)
MCH: 29.6 pg (ref 26.0–34.0)
MCHC: 31.9 g/dL (ref 30.0–36.0)
MCV: 92.9 fL (ref 80.0–100.0)
Platelet Count: 132 10*3/uL — ABNORMAL LOW (ref 150–400)
RBC: 2.94 MIL/uL — ABNORMAL LOW (ref 4.22–5.81)
RDW: 14.7 % (ref 11.5–15.5)
WBC Count: 5.7 10*3/uL (ref 4.0–10.5)
nRBC: 0 % (ref 0.0–0.2)

## 2018-07-26 NOTE — Telephone Encounter (Signed)
[facility] TO ARRANGE FOR THIS PATIENT TO BE HERE ON 07-28-18 @ 9:30 AM FOR LABS, SPOKE WITH PATRICIA OF [facility] AND THEY HAVE HIM SCHEDULED. PHONE NUMBER FOR [phone]

## 2018-07-26 NOTE — Telephone Encounter (Signed)
myoview was ordered for cardiac clearance for GI surgery for his cancer, however he has been hospitalized several times since then and d/c summary from 6/7 states:  His oncologist noted that he has previously been assessed multiple times and he is not a chemotherapy candidate nor a surgical candidate for resection.  It was decided that the most helpful way forward would be palliative radiation therapy.  Therefore order cancelled.

## 2018-07-26 NOTE — Telephone Encounter (Signed)
LM that we would check labs again Wednesday and no need for blood today.

## 2018-07-27 ENCOUNTER — Telehealth: Payer: Self-pay | Admitting: Radiation Oncology

## 2018-07-27 ENCOUNTER — Ambulatory Visit
Admission: RE | Admit: 2018-07-27 | Discharge: 2018-07-27 | Disposition: A | Discharge status: Home / Self Care | Payer: Medicare Other | Source: Ambulatory Visit | Attending: Radiation Oncology | Admitting: Radiation Oncology

## 2018-07-27 ENCOUNTER — Other Ambulatory Visit: Payer: Self-pay

## 2018-07-27 DIAGNOSIS — N184 Chronic kidney disease, stage 4 (severe): Secondary | ICD-10-CM | POA: Diagnosis not present

## 2018-07-27 DIAGNOSIS — I130 Hypertensive heart and chronic kidney disease with heart failure and stage 1 through stage 4 chronic kidney disease, or unspecified chronic kidney disease: Secondary | ICD-10-CM | POA: Diagnosis not present

## 2018-07-27 DIAGNOSIS — Z510 Encounter for antineoplastic radiation therapy: Secondary | ICD-10-CM | POA: Diagnosis not present

## 2018-07-27 DIAGNOSIS — D500 Iron deficiency anemia secondary to blood loss (chronic): Secondary | ICD-10-CM | POA: Diagnosis not present

## 2018-07-27 DIAGNOSIS — C163 Malignant neoplasm of pyloric antrum: Secondary | ICD-10-CM | POA: Diagnosis not present

## 2018-07-27 DIAGNOSIS — I5032 Chronic diastolic (congestive) heart failure: Secondary | ICD-10-CM | POA: Diagnosis not present

## 2018-07-27 NOTE — Telephone Encounter (Signed)
Scheduled appt per 6/23 sch message - unable to reach pt - left message with appt date and time for niece - unable to reach pt . Called the main number listed and someone picked up and said I needed to call a different number.

## 2018-07-28 ENCOUNTER — Ambulatory Visit
Admission: RE | Admit: 2018-07-28 | Discharge: 2018-07-28 | Disposition: A | Discharge status: Home / Self Care | Payer: Medicare Other | Source: Ambulatory Visit | Attending: Radiation Oncology | Admitting: Radiation Oncology

## 2018-07-28 ENCOUNTER — Telehealth: Payer: Self-pay | Admitting: Radiation Oncology

## 2018-07-28 ENCOUNTER — Other Ambulatory Visit: Payer: Self-pay

## 2018-07-28 DIAGNOSIS — Z510 Encounter for antineoplastic radiation therapy: Secondary | ICD-10-CM | POA: Diagnosis not present

## 2018-07-28 DIAGNOSIS — I130 Hypertensive heart and chronic kidney disease with heart failure and stage 1 through stage 4 chronic kidney disease, or unspecified chronic kidney disease: Secondary | ICD-10-CM | POA: Diagnosis not present

## 2018-07-28 DIAGNOSIS — N184 Chronic kidney disease, stage 4 (severe): Secondary | ICD-10-CM | POA: Diagnosis not present

## 2018-07-28 DIAGNOSIS — C163 Malignant neoplasm of pyloric antrum: Secondary | ICD-10-CM | POA: Diagnosis not present

## 2018-07-28 DIAGNOSIS — D500 Iron deficiency anemia secondary to blood loss (chronic): Secondary | ICD-10-CM | POA: Diagnosis not present

## 2018-07-28 DIAGNOSIS — I5032 Chronic diastolic (congestive) heart failure: Secondary | ICD-10-CM | POA: Diagnosis not present

## 2018-07-28 LAB — CBC (CANCER CENTER ONLY)
HCT: 28.5 % — ABNORMAL LOW (ref 39.0–52.0)
Hemoglobin: 9.1 g/dL — ABNORMAL LOW (ref 13.0–17.0)
MCH: 29.8 pg (ref 26.0–34.0)
MCHC: 31.9 g/dL (ref 30.0–36.0)
MCV: 93.4 fL (ref 80.0–100.0)
Platelet Count: 115 10*3/uL — ABNORMAL LOW (ref 150–400)
RBC: 3.05 MIL/uL — ABNORMAL LOW (ref 4.22–5.81)
RDW: 14.2 % (ref 11.5–15.5)
WBC Count: 4.7 10*3/uL (ref 4.0–10.5)
nRBC: 0 % (ref 0.0–0.2)

## 2018-07-28 NOTE — Telephone Encounter (Signed)
I called the patient's niece to let her know his Hgb was 9.1 today and no need for blood. Hopefully this is reflecting response to treatment. We will repeat on Friday of this week and next Monday then re-assess.

## 2018-07-29 ENCOUNTER — Ambulatory Visit
Admission: RE | Admit: 2018-07-29 | Discharge: 2018-07-29 | Disposition: A | Discharge status: Home / Self Care | Payer: Medicare Other | Source: Ambulatory Visit | Attending: Radiation Oncology | Admitting: Radiation Oncology

## 2018-07-29 ENCOUNTER — Other Ambulatory Visit: Payer: Self-pay

## 2018-07-29 DIAGNOSIS — D500 Iron deficiency anemia secondary to blood loss (chronic): Secondary | ICD-10-CM | POA: Diagnosis not present

## 2018-07-29 DIAGNOSIS — N184 Chronic kidney disease, stage 4 (severe): Secondary | ICD-10-CM | POA: Diagnosis not present

## 2018-07-29 DIAGNOSIS — I5032 Chronic diastolic (congestive) heart failure: Secondary | ICD-10-CM | POA: Diagnosis not present

## 2018-07-29 DIAGNOSIS — C163 Malignant neoplasm of pyloric antrum: Secondary | ICD-10-CM | POA: Diagnosis not present

## 2018-07-29 DIAGNOSIS — Z510 Encounter for antineoplastic radiation therapy: Secondary | ICD-10-CM | POA: Diagnosis not present

## 2018-07-29 DIAGNOSIS — I130 Hypertensive heart and chronic kidney disease with heart failure and stage 1 through stage 4 chronic kidney disease, or unspecified chronic kidney disease: Secondary | ICD-10-CM | POA: Diagnosis not present

## 2018-07-30 ENCOUNTER — Ambulatory Visit
Admission: RE | Admit: 2018-07-30 | Discharge: 2018-07-30 | Disposition: A | Discharge status: Home / Self Care | Payer: Medicare Other | Source: Ambulatory Visit | Attending: Radiation Oncology | Admitting: Radiation Oncology

## 2018-07-30 ENCOUNTER — Telehealth: Payer: Self-pay | Admitting: Radiation Oncology

## 2018-07-30 ENCOUNTER — Other Ambulatory Visit: Payer: Self-pay

## 2018-07-30 DIAGNOSIS — I130 Hypertensive heart and chronic kidney disease with heart failure and stage 1 through stage 4 chronic kidney disease, or unspecified chronic kidney disease: Secondary | ICD-10-CM | POA: Diagnosis not present

## 2018-07-30 DIAGNOSIS — D500 Iron deficiency anemia secondary to blood loss (chronic): Secondary | ICD-10-CM | POA: Diagnosis not present

## 2018-07-30 DIAGNOSIS — Z510 Encounter for antineoplastic radiation therapy: Secondary | ICD-10-CM | POA: Diagnosis not present

## 2018-07-30 DIAGNOSIS — C801 Malignant (primary) neoplasm, unspecified: Secondary | ICD-10-CM | POA: Diagnosis not present

## 2018-07-30 DIAGNOSIS — C163 Malignant neoplasm of pyloric antrum: Secondary | ICD-10-CM

## 2018-07-30 DIAGNOSIS — D002 Carcinoma in situ of stomach: Secondary | ICD-10-CM | POA: Diagnosis not present

## 2018-07-30 DIAGNOSIS — I5032 Chronic diastolic (congestive) heart failure: Secondary | ICD-10-CM | POA: Diagnosis not present

## 2018-07-30 DIAGNOSIS — D649 Anemia, unspecified: Secondary | ICD-10-CM | POA: Diagnosis not present

## 2018-07-30 DIAGNOSIS — N184 Chronic kidney disease, stage 4 (severe): Secondary | ICD-10-CM | POA: Diagnosis not present

## 2018-07-30 LAB — CBC (CANCER CENTER ONLY)
HCT: 28 % — ABNORMAL LOW (ref 39.0–52.0)
Hemoglobin: 9.3 g/dL — ABNORMAL LOW (ref 13.0–17.0)
MCH: 29.5 pg (ref 26.0–34.0)
MCHC: 33.2 g/dL (ref 30.0–36.0)
MCV: 88.9 fL (ref 80.0–100.0)
Platelet Count: 113 10*3/uL — ABNORMAL LOW (ref 150–400)
RBC: 3.15 MIL/uL — ABNORMAL LOW (ref 4.22–5.81)
RDW: 14 % (ref 11.5–15.5)
WBC Count: 4.7 10*3/uL (ref 4.0–10.5)
nRBC: 0 % (ref 0.0–0.2)

## 2018-07-30 NOTE — Telephone Encounter (Signed)
I called this morning to let Leonard [name]'s niece Karie Kirks know his hgb and no need to transfuse today. We will recheck labs next week and hopefully discontinue routine labs as he appears hemodynamically stable.

## 2018-08-02 ENCOUNTER — Other Ambulatory Visit: Payer: Self-pay

## 2018-08-02 ENCOUNTER — Ambulatory Visit: Payer: Medicare Other

## 2018-08-02 ENCOUNTER — Ambulatory Visit
Admission: RE | Admit: 2018-08-02 | Discharge: 2018-08-02 | Disposition: A | Discharge status: Home / Self Care | Payer: Medicare Other | Source: Ambulatory Visit | Attending: Radiation Oncology | Admitting: Radiation Oncology

## 2018-08-02 DIAGNOSIS — C163 Malignant neoplasm of pyloric antrum: Secondary | ICD-10-CM | POA: Diagnosis not present

## 2018-08-02 DIAGNOSIS — N184 Chronic kidney disease, stage 4 (severe): Secondary | ICD-10-CM | POA: Diagnosis not present

## 2018-08-02 DIAGNOSIS — I5032 Chronic diastolic (congestive) heart failure: Secondary | ICD-10-CM | POA: Diagnosis not present

## 2018-08-02 DIAGNOSIS — I130 Hypertensive heart and chronic kidney disease with heart failure and stage 1 through stage 4 chronic kidney disease, or unspecified chronic kidney disease: Secondary | ICD-10-CM | POA: Diagnosis not present

## 2018-08-02 DIAGNOSIS — D500 Iron deficiency anemia secondary to blood loss (chronic): Secondary | ICD-10-CM | POA: Diagnosis not present

## 2018-08-02 DIAGNOSIS — Z510 Encounter for antineoplastic radiation therapy: Secondary | ICD-10-CM | POA: Diagnosis not present

## 2018-08-03 ENCOUNTER — Ambulatory Visit
Admission: RE | Admit: 2018-08-03 | Discharge: 2018-08-03 | Disposition: A | Discharge status: Home / Self Care | Payer: Medicare Other | Source: Ambulatory Visit | Attending: Radiation Oncology | Admitting: Radiation Oncology

## 2018-08-03 ENCOUNTER — Telehealth: Payer: Self-pay | Admitting: Radiation Oncology

## 2018-08-03 ENCOUNTER — Other Ambulatory Visit: Payer: Self-pay

## 2018-08-03 DIAGNOSIS — C163 Malignant neoplasm of pyloric antrum: Secondary | ICD-10-CM

## 2018-08-03 DIAGNOSIS — I5032 Chronic diastolic (congestive) heart failure: Secondary | ICD-10-CM | POA: Diagnosis not present

## 2018-08-03 DIAGNOSIS — Z510 Encounter for antineoplastic radiation therapy: Secondary | ICD-10-CM | POA: Diagnosis not present

## 2018-08-03 DIAGNOSIS — D500 Iron deficiency anemia secondary to blood loss (chronic): Secondary | ICD-10-CM | POA: Diagnosis not present

## 2018-08-03 DIAGNOSIS — I130 Hypertensive heart and chronic kidney disease with heart failure and stage 1 through stage 4 chronic kidney disease, or unspecified chronic kidney disease: Secondary | ICD-10-CM | POA: Diagnosis not present

## 2018-08-03 DIAGNOSIS — N184 Chronic kidney disease, stage 4 (severe): Secondary | ICD-10-CM | POA: Diagnosis not present

## 2018-08-03 LAB — CBC (CANCER CENTER ONLY)
HCT: 26.5 % — ABNORMAL LOW (ref 39.0–52.0)
Hemoglobin: 8.6 g/dL — ABNORMAL LOW (ref 13.0–17.0)
MCH: 29.2 pg (ref 26.0–34.0)
MCHC: 32.5 g/dL (ref 30.0–36.0)
MCV: 89.8 fL (ref 80.0–100.0)
Platelet Count: 93 10*3/uL — ABNORMAL LOW (ref 150–400)
RBC: 2.95 MIL/uL — ABNORMAL LOW (ref 4.22–5.81)
RDW: 13.6 % (ref 11.5–15.5)
WBC Count: 4.9 10*3/uL (ref 4.0–10.5)
nRBC: 0 % (ref 0.0–0.2)

## 2018-08-03 NOTE — Telephone Encounter (Signed)
I called Leonard [name] to let her know Hgb was 8.6 today. We will recheck on Thursday again and hopefully not need any blood then. I suspect he's stabilizing but we will follow.

## 2018-08-04 ENCOUNTER — Ambulatory Visit
Admission: RE | Admit: 2018-08-04 | Discharge: 2018-08-04 | Disposition: A | Discharge status: Home / Self Care | Payer: Medicare Other | Source: Ambulatory Visit | Attending: Radiation Oncology | Admitting: Radiation Oncology

## 2018-08-04 ENCOUNTER — Other Ambulatory Visit: Payer: Self-pay

## 2018-08-04 DIAGNOSIS — D500 Iron deficiency anemia secondary to blood loss (chronic): Secondary | ICD-10-CM | POA: Diagnosis not present

## 2018-08-04 DIAGNOSIS — I2721 Secondary pulmonary arterial hypertension: Secondary | ICD-10-CM | POA: Insufficient documentation

## 2018-08-04 DIAGNOSIS — E7800 Pure hypercholesterolemia, unspecified: Secondary | ICD-10-CM | POA: Insufficient documentation

## 2018-08-04 DIAGNOSIS — I2510 Atherosclerotic heart disease of native coronary artery without angina pectoris: Secondary | ICD-10-CM | POA: Insufficient documentation

## 2018-08-04 DIAGNOSIS — Z87891 Personal history of nicotine dependence: Secondary | ICD-10-CM | POA: Insufficient documentation

## 2018-08-04 DIAGNOSIS — Z510 Encounter for antineoplastic radiation therapy: Secondary | ICD-10-CM | POA: Diagnosis not present

## 2018-08-04 DIAGNOSIS — Z79899 Other long term (current) drug therapy: Secondary | ICD-10-CM | POA: Insufficient documentation

## 2018-08-04 DIAGNOSIS — J449 Chronic obstructive pulmonary disease, unspecified: Secondary | ICD-10-CM | POA: Insufficient documentation

## 2018-08-04 DIAGNOSIS — I5032 Chronic diastolic (congestive) heart failure: Secondary | ICD-10-CM | POA: Diagnosis not present

## 2018-08-04 DIAGNOSIS — I130 Hypertensive heart and chronic kidney disease with heart failure and stage 1 through stage 4 chronic kidney disease, or unspecified chronic kidney disease: Secondary | ICD-10-CM | POA: Insufficient documentation

## 2018-08-04 DIAGNOSIS — N184 Chronic kidney disease, stage 4 (severe): Secondary | ICD-10-CM | POA: Insufficient documentation

## 2018-08-04 DIAGNOSIS — C163 Malignant neoplasm of pyloric antrum: Secondary | ICD-10-CM | POA: Diagnosis not present

## 2018-08-04 DIAGNOSIS — Z86718 Personal history of other venous thrombosis and embolism: Secondary | ICD-10-CM | POA: Insufficient documentation

## 2018-08-04 DIAGNOSIS — D509 Iron deficiency anemia, unspecified: Secondary | ICD-10-CM | POA: Insufficient documentation

## 2018-08-04 DIAGNOSIS — Z7901 Long term (current) use of anticoagulants: Secondary | ICD-10-CM | POA: Insufficient documentation

## 2018-08-04 DIAGNOSIS — E1151 Type 2 diabetes mellitus with diabetic peripheral angiopathy without gangrene: Secondary | ICD-10-CM | POA: Insufficient documentation

## 2018-08-04 DIAGNOSIS — Z801 Family history of malignant neoplasm of trachea, bronchus and lung: Secondary | ICD-10-CM | POA: Insufficient documentation

## 2018-08-04 DIAGNOSIS — E1122 Type 2 diabetes mellitus with diabetic chronic kidney disease: Secondary | ICD-10-CM | POA: Insufficient documentation

## 2018-08-05 ENCOUNTER — Other Ambulatory Visit: Payer: Self-pay

## 2018-08-05 ENCOUNTER — Ambulatory Visit
Admission: RE | Admit: 2018-08-05 | Discharge: 2018-08-05 | Disposition: A | Discharge status: Home / Self Care | Payer: Medicare Other | Source: Ambulatory Visit | Attending: Radiation Oncology | Admitting: Radiation Oncology

## 2018-08-05 ENCOUNTER — Other Ambulatory Visit: Payer: Self-pay | Admitting: Radiation Oncology

## 2018-08-05 ENCOUNTER — Telehealth: Payer: Self-pay | Admitting: *Deleted

## 2018-08-05 DIAGNOSIS — Z510 Encounter for antineoplastic radiation therapy: Secondary | ICD-10-CM | POA: Diagnosis not present

## 2018-08-05 DIAGNOSIS — C163 Malignant neoplasm of pyloric antrum: Secondary | ICD-10-CM

## 2018-08-05 DIAGNOSIS — I5032 Chronic diastolic (congestive) heart failure: Secondary | ICD-10-CM | POA: Diagnosis not present

## 2018-08-05 DIAGNOSIS — I130 Hypertensive heart and chronic kidney disease with heart failure and stage 1 through stage 4 chronic kidney disease, or unspecified chronic kidney disease: Secondary | ICD-10-CM | POA: Diagnosis not present

## 2018-08-05 DIAGNOSIS — N184 Chronic kidney disease, stage 4 (severe): Secondary | ICD-10-CM | POA: Diagnosis not present

## 2018-08-05 DIAGNOSIS — D500 Iron deficiency anemia secondary to blood loss (chronic): Secondary | ICD-10-CM | POA: Diagnosis not present

## 2018-08-05 LAB — CBC (CANCER CENTER ONLY)
HCT: 28.1 % — ABNORMAL LOW (ref 39.0–52.0)
Hemoglobin: 9.2 g/dL — ABNORMAL LOW (ref 13.0–17.0)
MCH: 29.6 pg (ref 26.0–34.0)
MCHC: 32.7 g/dL (ref 30.0–36.0)
MCV: 90.4 fL (ref 80.0–100.0)
Platelet Count: 93 10*3/uL — ABNORMAL LOW (ref 150–400)
RBC: 3.11 MIL/uL — ABNORMAL LOW (ref 4.22–5.81)
RDW: 14.1 % (ref 11.5–15.5)
WBC Count: 5.4 10*3/uL (ref 4.0–10.5)
nRBC: 0 % (ref 0.0–0.2)

## 2018-08-05 NOTE — Telephone Encounter (Signed)
Left a voicemail with Rosalyn (patient's Niece-POA) with the results of his lab work today.  I let her know that his hemoglobin is trending up and we plan to repeat labs on Tuesday 08/10/2018.  Left my call back number in the even she has further questions [phone]).  Will continue to follow as necessary.  Gloriajean Dell. Leonie Green, BSN

## 2018-08-09 ENCOUNTER — Other Ambulatory Visit: Payer: Self-pay

## 2018-08-09 ENCOUNTER — Ambulatory Visit
Admission: RE | Admit: 2018-08-09 | Discharge: 2018-08-09 | Disposition: A | Discharge status: Home / Self Care | Payer: Medicare Other | Source: Ambulatory Visit | Attending: Radiation Oncology | Admitting: Radiation Oncology

## 2018-08-09 DIAGNOSIS — D002 Carcinoma in situ of stomach: Secondary | ICD-10-CM | POA: Diagnosis not present

## 2018-08-09 DIAGNOSIS — C801 Malignant (primary) neoplasm, unspecified: Secondary | ICD-10-CM | POA: Diagnosis not present

## 2018-08-09 DIAGNOSIS — I130 Hypertensive heart and chronic kidney disease with heart failure and stage 1 through stage 4 chronic kidney disease, or unspecified chronic kidney disease: Secondary | ICD-10-CM | POA: Diagnosis not present

## 2018-08-09 DIAGNOSIS — Z510 Encounter for antineoplastic radiation therapy: Secondary | ICD-10-CM | POA: Diagnosis not present

## 2018-08-09 DIAGNOSIS — C163 Malignant neoplasm of pyloric antrum: Secondary | ICD-10-CM | POA: Diagnosis not present

## 2018-08-09 DIAGNOSIS — D649 Anemia, unspecified: Secondary | ICD-10-CM | POA: Diagnosis not present

## 2018-08-09 DIAGNOSIS — I5032 Chronic diastolic (congestive) heart failure: Secondary | ICD-10-CM | POA: Diagnosis not present

## 2018-08-09 DIAGNOSIS — D500 Iron deficiency anemia secondary to blood loss (chronic): Secondary | ICD-10-CM | POA: Diagnosis not present

## 2018-08-09 DIAGNOSIS — N184 Chronic kidney disease, stage 4 (severe): Secondary | ICD-10-CM | POA: Diagnosis not present

## 2018-08-09 NOTE — Telephone Encounter (Signed)
It appears that this pt is having this checked at the cancer center according to his labs. Leonard [name], CMA

## 2018-08-10 ENCOUNTER — Ambulatory Visit
Admission: RE | Admit: 2018-08-10 | Discharge: 2018-08-10 | Disposition: A | Discharge status: Home / Self Care | Payer: Medicare Other | Source: Ambulatory Visit | Attending: Radiation Oncology | Admitting: Radiation Oncology

## 2018-08-10 ENCOUNTER — Telehealth: Payer: Self-pay | Admitting: *Deleted

## 2018-08-10 ENCOUNTER — Other Ambulatory Visit: Payer: Self-pay

## 2018-08-10 DIAGNOSIS — I5032 Chronic diastolic (congestive) heart failure: Secondary | ICD-10-CM | POA: Diagnosis not present

## 2018-08-10 DIAGNOSIS — I130 Hypertensive heart and chronic kidney disease with heart failure and stage 1 through stage 4 chronic kidney disease, or unspecified chronic kidney disease: Secondary | ICD-10-CM | POA: Diagnosis not present

## 2018-08-10 DIAGNOSIS — N184 Chronic kidney disease, stage 4 (severe): Secondary | ICD-10-CM | POA: Diagnosis not present

## 2018-08-10 DIAGNOSIS — D500 Iron deficiency anemia secondary to blood loss (chronic): Secondary | ICD-10-CM | POA: Diagnosis not present

## 2018-08-10 DIAGNOSIS — C163 Malignant neoplasm of pyloric antrum: Secondary | ICD-10-CM

## 2018-08-10 DIAGNOSIS — C801 Malignant (primary) neoplasm, unspecified: Secondary | ICD-10-CM

## 2018-08-10 DIAGNOSIS — Z510 Encounter for antineoplastic radiation therapy: Secondary | ICD-10-CM | POA: Diagnosis not present

## 2018-08-10 LAB — CBC WITH DIFFERENTIAL/PLATELET
Abs Immature Granulocytes: 0.03 10*3/uL (ref 0.00–0.07)
Basophils Absolute: 0 10*3/uL (ref 0.0–0.1)
Basophils Relative: 1 %
Eosinophils Absolute: 0.9 10*3/uL — ABNORMAL HIGH (ref 0.0–0.5)
Eosinophils Relative: 18 %
HCT: 25.4 % — ABNORMAL LOW (ref 39.0–52.0)
Hemoglobin: 8.4 g/dL — ABNORMAL LOW (ref 13.0–17.0)
Immature Granulocytes: 1 %
Lymphocytes Relative: 4 %
Lymphs Abs: 0.2 10*3/uL — ABNORMAL LOW (ref 0.7–4.0)
MCH: 29.7 pg (ref 26.0–34.0)
MCHC: 33.1 g/dL (ref 30.0–36.0)
MCV: 89.8 fL (ref 80.0–100.0)
Monocytes Absolute: 0.5 10*3/uL (ref 0.1–1.0)
Monocytes Relative: 10 %
Neutro Abs: 3.2 10*3/uL (ref 1.7–7.7)
Neutrophils Relative %: 66 %
Platelets: 83 10*3/uL — ABNORMAL LOW (ref 150–400)
RBC: 2.83 MIL/uL — ABNORMAL LOW (ref 4.22–5.81)
RDW: 13.9 % (ref 11.5–15.5)
WBC: 4.8 10*3/uL (ref 4.0–10.5)
nRBC: 0 % (ref 0.0–0.2)

## 2018-08-10 LAB — SAMPLE TO BLOOD BANK

## 2018-08-10 NOTE — Telephone Encounter (Signed)
Spoke with patients niece Karie Kirks to let her know the results of his lab work today.  Hemoglobin dropped a little and we plan to repeat labs on Thursday 08/12/2018.  Will continue to follow as necessary.  Gloriajean Dell. Leonie Green, BSN

## 2018-08-11 ENCOUNTER — Other Ambulatory Visit: Payer: Self-pay

## 2018-08-11 ENCOUNTER — Ambulatory Visit
Admission: RE | Admit: 2018-08-11 | Discharge: 2018-08-11 | Disposition: A | Discharge status: Home / Self Care | Payer: Medicare Other | Source: Ambulatory Visit | Attending: Radiation Oncology | Admitting: Radiation Oncology

## 2018-08-11 DIAGNOSIS — C163 Malignant neoplasm of pyloric antrum: Secondary | ICD-10-CM | POA: Diagnosis not present

## 2018-08-11 DIAGNOSIS — Z510 Encounter for antineoplastic radiation therapy: Secondary | ICD-10-CM | POA: Diagnosis not present

## 2018-08-11 DIAGNOSIS — D500 Iron deficiency anemia secondary to blood loss (chronic): Secondary | ICD-10-CM | POA: Diagnosis not present

## 2018-08-11 DIAGNOSIS — N184 Chronic kidney disease, stage 4 (severe): Secondary | ICD-10-CM | POA: Diagnosis not present

## 2018-08-11 DIAGNOSIS — I5032 Chronic diastolic (congestive) heart failure: Secondary | ICD-10-CM | POA: Diagnosis not present

## 2018-08-11 DIAGNOSIS — I130 Hypertensive heart and chronic kidney disease with heart failure and stage 1 through stage 4 chronic kidney disease, or unspecified chronic kidney disease: Secondary | ICD-10-CM | POA: Diagnosis not present

## 2018-08-12 ENCOUNTER — Other Ambulatory Visit: Payer: Self-pay

## 2018-08-12 ENCOUNTER — Telehealth: Payer: Self-pay | Admitting: *Deleted

## 2018-08-12 ENCOUNTER — Ambulatory Visit
Admission: RE | Admit: 2018-08-12 | Discharge: 2018-08-12 | Disposition: A | Discharge status: Home / Self Care | Payer: Medicare Other | Source: Ambulatory Visit | Attending: Radiation Oncology | Admitting: Radiation Oncology

## 2018-08-12 ENCOUNTER — Other Ambulatory Visit: Payer: Self-pay | Admitting: Radiation Oncology

## 2018-08-12 DIAGNOSIS — C801 Malignant (primary) neoplasm, unspecified: Secondary | ICD-10-CM

## 2018-08-12 DIAGNOSIS — Z510 Encounter for antineoplastic radiation therapy: Secondary | ICD-10-CM | POA: Diagnosis not present

## 2018-08-12 DIAGNOSIS — I130 Hypertensive heart and chronic kidney disease with heart failure and stage 1 through stage 4 chronic kidney disease, or unspecified chronic kidney disease: Secondary | ICD-10-CM | POA: Diagnosis not present

## 2018-08-12 DIAGNOSIS — C163 Malignant neoplasm of pyloric antrum: Secondary | ICD-10-CM | POA: Diagnosis not present

## 2018-08-12 DIAGNOSIS — N184 Chronic kidney disease, stage 4 (severe): Secondary | ICD-10-CM | POA: Diagnosis not present

## 2018-08-12 DIAGNOSIS — D500 Iron deficiency anemia secondary to blood loss (chronic): Secondary | ICD-10-CM | POA: Diagnosis not present

## 2018-08-12 DIAGNOSIS — I5032 Chronic diastolic (congestive) heart failure: Secondary | ICD-10-CM | POA: Diagnosis not present

## 2018-08-12 LAB — CBC (CANCER CENTER ONLY)
HCT: 24.5 % — ABNORMAL LOW (ref 39.0–52.0)
Hemoglobin: 8 g/dL — ABNORMAL LOW (ref 13.0–17.0)
MCH: 29.1 pg (ref 26.0–34.0)
MCHC: 32.7 g/dL (ref 30.0–36.0)
MCV: 89.1 fL (ref 80.0–100.0)
Platelet Count: 87 10*3/uL — ABNORMAL LOW (ref 150–400)
RBC: 2.75 MIL/uL — ABNORMAL LOW (ref 4.22–5.81)
RDW: 13.7 % (ref 11.5–15.5)
WBC Count: 6 10*3/uL (ref 4.0–10.5)
nRBC: 0 % (ref 0.0–0.2)

## 2018-08-12 NOTE — Telephone Encounter (Signed)
Spoke with Karie Kirks to let her know the results of his lab work done today.  His hemoglobin dropped a little more.  I let her know that we plan to repeat labs on Monday 08/16/2018 and have slotted him a spot for one unit of PRBC if he should need them.  She verbalized understanding.  Will continue to follow as necessary.  Gloriajean Dell. Leonie Green, BSN

## 2018-08-13 ENCOUNTER — Ambulatory Visit
Admission: RE | Admit: 2018-08-13 | Discharge: 2018-08-13 | Disposition: A | Discharge status: Home / Self Care | Payer: Medicare Other | Source: Ambulatory Visit | Attending: Radiation Oncology | Admitting: Radiation Oncology

## 2018-08-13 ENCOUNTER — Other Ambulatory Visit: Payer: Self-pay

## 2018-08-13 DIAGNOSIS — I130 Hypertensive heart and chronic kidney disease with heart failure and stage 1 through stage 4 chronic kidney disease, or unspecified chronic kidney disease: Secondary | ICD-10-CM | POA: Diagnosis not present

## 2018-08-13 DIAGNOSIS — D500 Iron deficiency anemia secondary to blood loss (chronic): Secondary | ICD-10-CM | POA: Diagnosis not present

## 2018-08-13 DIAGNOSIS — C163 Malignant neoplasm of pyloric antrum: Secondary | ICD-10-CM | POA: Diagnosis not present

## 2018-08-13 DIAGNOSIS — N184 Chronic kidney disease, stage 4 (severe): Secondary | ICD-10-CM | POA: Diagnosis not present

## 2018-08-13 DIAGNOSIS — Z510 Encounter for antineoplastic radiation therapy: Secondary | ICD-10-CM | POA: Diagnosis not present

## 2018-08-13 DIAGNOSIS — I5032 Chronic diastolic (congestive) heart failure: Secondary | ICD-10-CM | POA: Diagnosis not present

## 2018-08-16 ENCOUNTER — Other Ambulatory Visit: Payer: Self-pay

## 2018-08-16 ENCOUNTER — Other Ambulatory Visit: Payer: Self-pay | Admitting: Emergency Medicine

## 2018-08-16 ENCOUNTER — Ambulatory Visit
Admission: RE | Admit: 2018-08-16 | Discharge: 2018-08-16 | Disposition: A | Discharge status: Home / Self Care | Payer: Medicare Other | Source: Ambulatory Visit | Attending: Radiation Oncology | Admitting: Radiation Oncology

## 2018-08-16 ENCOUNTER — Inpatient Hospital Stay: Payer: No Typology Code available for payment source | Attending: Radiation Oncology

## 2018-08-16 ENCOUNTER — Other Ambulatory Visit: Payer: Self-pay | Admitting: Radiation Oncology

## 2018-08-16 DIAGNOSIS — I130 Hypertensive heart and chronic kidney disease with heart failure and stage 1 through stage 4 chronic kidney disease, or unspecified chronic kidney disease: Secondary | ICD-10-CM | POA: Diagnosis not present

## 2018-08-16 DIAGNOSIS — C163 Malignant neoplasm of pyloric antrum: Secondary | ICD-10-CM | POA: Diagnosis not present

## 2018-08-16 DIAGNOSIS — Z510 Encounter for antineoplastic radiation therapy: Secondary | ICD-10-CM | POA: Diagnosis not present

## 2018-08-16 DIAGNOSIS — D631 Anemia in chronic kidney disease: Secondary | ICD-10-CM | POA: Insufficient documentation

## 2018-08-16 DIAGNOSIS — N184 Chronic kidney disease, stage 4 (severe): Secondary | ICD-10-CM | POA: Diagnosis not present

## 2018-08-16 DIAGNOSIS — E1122 Type 2 diabetes mellitus with diabetic chronic kidney disease: Secondary | ICD-10-CM | POA: Diagnosis not present

## 2018-08-16 DIAGNOSIS — C801 Malignant (primary) neoplasm, unspecified: Secondary | ICD-10-CM

## 2018-08-16 DIAGNOSIS — D500 Iron deficiency anemia secondary to blood loss (chronic): Secondary | ICD-10-CM | POA: Diagnosis not present

## 2018-08-16 DIAGNOSIS — I5032 Chronic diastolic (congestive) heart failure: Secondary | ICD-10-CM | POA: Diagnosis not present

## 2018-08-16 DIAGNOSIS — I129 Hypertensive chronic kidney disease with stage 1 through stage 4 chronic kidney disease, or unspecified chronic kidney disease: Secondary | ICD-10-CM | POA: Diagnosis not present

## 2018-08-16 DIAGNOSIS — D509 Iron deficiency anemia, unspecified: Secondary | ICD-10-CM | POA: Diagnosis not present

## 2018-08-16 LAB — CBC WITH DIFFERENTIAL (CANCER CENTER ONLY)
Abs Immature Granulocytes: 0.03 10*3/uL (ref 0.00–0.07)
Basophils Absolute: 0 10*3/uL (ref 0.0–0.1)
Basophils Relative: 0 %
Eosinophils Absolute: 1.2 10*3/uL — ABNORMAL HIGH (ref 0.0–0.5)
Eosinophils Relative: 24 %
HCT: 23.9 % — ABNORMAL LOW (ref 39.0–52.0)
Hemoglobin: 7.9 g/dL — ABNORMAL LOW (ref 13.0–17.0)
Immature Granulocytes: 1 %
Lymphocytes Relative: 2 %
Lymphs Abs: 0.1 10*3/uL — ABNORMAL LOW (ref 0.7–4.0)
MCH: 28.8 pg (ref 26.0–34.0)
MCHC: 33.1 g/dL (ref 30.0–36.0)
MCV: 87.2 fL (ref 80.0–100.0)
Monocytes Absolute: 0.7 10*3/uL (ref 0.1–1.0)
Monocytes Relative: 15 %
Neutro Abs: 2.8 10*3/uL (ref 1.7–7.7)
Neutrophils Relative %: 58 %
Platelet Count: 108 10*3/uL — ABNORMAL LOW (ref 150–400)
RBC: 2.74 MIL/uL — ABNORMAL LOW (ref 4.22–5.81)
RDW: 13.9 % (ref 11.5–15.5)
WBC Count: 4.8 10*3/uL (ref 4.0–10.5)
nRBC: 0 % (ref 0.0–0.2)

## 2018-08-16 LAB — PREPARE RBC (CROSSMATCH)

## 2018-08-16 LAB — SAMPLE TO BLOOD BANK

## 2018-08-16 MED ORDER — HEPARIN SOD (PORK) LOCK FLUSH 100 UNIT/ML IV SOLN
500.0000 [IU] | Freq: Every day | INTRAVENOUS | Status: DC | PRN
  Filled 2018-08-16: qty 5

## 2018-08-16 MED ORDER — DIPHENHYDRAMINE HCL 25 MG PO CAPS
25.0000 mg | ORAL_CAPSULE | Freq: Once | ORAL | Status: AC
  Administered 2018-08-16: 25 mg via ORAL

## 2018-08-16 MED ORDER — SODIUM CHLORIDE 0.9% FLUSH: 10.0000 mL | INTRAVENOUS | Status: DC | PRN

## 2018-08-16 MED ORDER — ACETAMINOPHEN 325 MG PO TABS
650.0000 mg | ORAL_TABLET | Freq: Once | ORAL | Status: DC
  Filled 2018-08-16: qty 2

## 2018-08-16 MED ORDER — HEPARIN SOD (PORK) LOCK FLUSH 100 UNIT/ML IV SOLN: 250.0000 [IU] | INTRAVENOUS | Status: DC | PRN

## 2018-08-16 MED ORDER — SODIUM CHLORIDE 0.9% IV SOLUTION
250.0000 mL | Freq: Once | INTRAVENOUS | Status: AC
  Administered 2018-08-16: 250 mL via INTRAVENOUS
  Filled 2018-08-16: qty 250

## 2018-08-16 MED ORDER — SODIUM CHLORIDE 0.9% IV SOLUTION
250.0000 mL | Freq: Once | INTRAVENOUS | Status: DC
  Filled 2018-08-16: qty 250

## 2018-08-16 MED ORDER — ACETAMINOPHEN 325 MG PO TABS
ORAL_TABLET | ORAL | Status: AC
  Filled 2018-08-16: qty 2

## 2018-08-16 MED ORDER — SODIUM CHLORIDE 0.9% FLUSH
10.0000 mL | INTRAVENOUS | Status: DC | PRN
  Filled 2018-08-16: qty 10

## 2018-08-16 MED ORDER — SODIUM CHLORIDE 0.9% FLUSH: 3.0000 mL | INTRAVENOUS | Status: DC | PRN

## 2018-08-16 MED ORDER — DIPHENHYDRAMINE HCL 25 MG PO CAPS
ORAL_CAPSULE | ORAL | Status: AC
  Filled 2018-08-16: qty 1

## 2018-08-16 MED ORDER — DIPHENHYDRAMINE HCL 25 MG PO CAPS
25.0000 mg | ORAL_CAPSULE | Freq: Once | ORAL | Status: DC
  Filled 2018-08-16: qty 1

## 2018-08-16 MED ORDER — ACETAMINOPHEN 325 MG PO TABS
650.0000 mg | ORAL_TABLET | Freq: Once | ORAL | Status: AC
  Administered 2018-08-16: 650 mg via ORAL

## 2018-08-16 MED ORDER — HEPARIN SOD (PORK) LOCK FLUSH 100 UNIT/ML IV SOLN: 500.0000 [IU] | Freq: Every day | INTRAVENOUS | Status: DC | PRN

## 2018-08-16 NOTE — Progress Notes (Signed)
Notified Dezman Granda that Mr. Asby would be having a blood transfusion.  today. Notified [place] that Mr. Dornfeld has an appointment on 08/18/18 @ 900 am for lab work.

## 2018-08-16 NOTE — Progress Notes (Signed)
Pt received 1 unit of PRBCs today, tolerated well.  Ate and drank during transfusion without any issues.  VSS.  A&Ox4.  Escorted via WC to exit with belongings.  Philander (driver) called, on the way to pick patient up.  Pt verbalized understanding of d/c instructions and to f/u as needed with any questions or concerns.

## 2018-08-16 NOTE — Patient Instructions (Signed)
Blood Transfusion, Adult, Care After This sheet gives you information about how to care for yourself after your procedure. Your doctor may also give you more specific instructions. If you have problems or questions, contact your doctor. Follow these instructions at home:   Take over-the-counter and prescription medicines only as told by your doctor.  Go back to your normal activities as told by your doctor.  Follow instructions from your doctor about how to take care of the area where an IV tube was put into your vein (insertion site). Make sure you: ? Wash your hands with soap and water before you change your bandage (dressing). If there is no soap and water, use hand sanitizer. ? Change your bandage as told by your doctor.  Check your IV insertion site every day for signs of infection. Check for: ? More redness, swelling, or pain. ? More fluid or blood. ? Warmth. ? Pus or a bad smell. Contact a doctor if:  You have more redness, swelling, or pain around the IV insertion site.  You have more fluid or blood coming from the IV insertion site.  Your IV insertion site feels warm to the touch.  You have pus or a bad smell coming from the IV insertion site.  Your pee (urine) turns pink, red, or brown.  You feel weak after doing your normal activities. Get help right away if:  You have signs of a serious allergic or body defense (immune) system reaction, including: ? Itchiness. ? Hives. ? Trouble breathing. ? Anxiety. ? Pain in your chest or lower back. ? Fever, flushing, and chills. ? Fast pulse. ? Rash. ? Watery poop (diarrhea). ? Throwing up (vomiting). ? Dark pee. ? Serious headache. ? Dizziness. ? Stiff neck. ? Yellow color in your face or the white parts of your eyes (jaundice). Summary  After a blood transfusion, return to your normal activities as told by your doctor.  Every day, check for signs of infection where the IV tube was put into your vein.  Some  signs of infection are warm skin, more redness and pain, more fluid or blood, and pus or a bad smell where the needle went in.  Contact your doctor if you feel weak or have any unusual symptoms. This information is not intended to replace advice given to you by your health care provider. Make sure you discuss any questions you have with your health care provider. Document Released: 02/10/2014 Document Revised: 05/27/2017 Document Reviewed: 09/14/2015 Elsevier Patient Education  2020 [employer]  

## 2018-08-17 ENCOUNTER — Ambulatory Visit: Payer: Medicare Other | Admitting: Gastroenterology

## 2018-08-17 ENCOUNTER — Other Ambulatory Visit: Payer: Self-pay

## 2018-08-17 ENCOUNTER — Ambulatory Visit
Admission: RE | Admit: 2018-08-17 | Discharge: 2018-08-17 | Disposition: A | Discharge status: Home / Self Care | Payer: Medicare Other | Source: Ambulatory Visit | Attending: Radiation Oncology | Admitting: Radiation Oncology

## 2018-08-17 DIAGNOSIS — C163 Malignant neoplasm of pyloric antrum: Secondary | ICD-10-CM | POA: Diagnosis not present

## 2018-08-17 DIAGNOSIS — Z510 Encounter for antineoplastic radiation therapy: Secondary | ICD-10-CM | POA: Diagnosis not present

## 2018-08-17 DIAGNOSIS — I5032 Chronic diastolic (congestive) heart failure: Secondary | ICD-10-CM | POA: Diagnosis not present

## 2018-08-17 DIAGNOSIS — D500 Iron deficiency anemia secondary to blood loss (chronic): Secondary | ICD-10-CM | POA: Diagnosis not present

## 2018-08-17 DIAGNOSIS — N184 Chronic kidney disease, stage 4 (severe): Secondary | ICD-10-CM | POA: Diagnosis not present

## 2018-08-17 DIAGNOSIS — I130 Hypertensive heart and chronic kidney disease with heart failure and stage 1 through stage 4 chronic kidney disease, or unspecified chronic kidney disease: Secondary | ICD-10-CM | POA: Diagnosis not present

## 2018-08-17 LAB — BPAM RBC
Blood Product Expiration Date: 202007262359
ISSUE DATE / TIME: 202007131212
Unit Type and Rh: 1700

## 2018-08-17 LAB — TYPE AND SCREEN
ABO/RH(D): B NEG
Antibody Screen: NEGATIVE
Unit division: 0

## 2018-08-18 ENCOUNTER — Other Ambulatory Visit: Payer: Self-pay | Admitting: Radiation Oncology

## 2018-08-18 ENCOUNTER — Ambulatory Visit
Admission: RE | Admit: 2018-08-18 | Discharge: 2018-08-18 | Disposition: A | Discharge status: Home / Self Care | Payer: Medicare Other | Source: Ambulatory Visit | Attending: Radiation Oncology | Admitting: Radiation Oncology

## 2018-08-18 ENCOUNTER — Telehealth: Payer: Self-pay | Admitting: *Deleted

## 2018-08-18 ENCOUNTER — Other Ambulatory Visit: Payer: Self-pay

## 2018-08-18 DIAGNOSIS — N184 Chronic kidney disease, stage 4 (severe): Secondary | ICD-10-CM | POA: Diagnosis not present

## 2018-08-18 DIAGNOSIS — C163 Malignant neoplasm of pyloric antrum: Secondary | ICD-10-CM

## 2018-08-18 DIAGNOSIS — Z510 Encounter for antineoplastic radiation therapy: Secondary | ICD-10-CM | POA: Diagnosis not present

## 2018-08-18 DIAGNOSIS — I5032 Chronic diastolic (congestive) heart failure: Secondary | ICD-10-CM | POA: Diagnosis not present

## 2018-08-18 DIAGNOSIS — D500 Iron deficiency anemia secondary to blood loss (chronic): Secondary | ICD-10-CM | POA: Diagnosis not present

## 2018-08-18 DIAGNOSIS — I130 Hypertensive heart and chronic kidney disease with heart failure and stage 1 through stage 4 chronic kidney disease, or unspecified chronic kidney disease: Secondary | ICD-10-CM | POA: Diagnosis not present

## 2018-08-18 LAB — CBC WITH DIFFERENTIAL (CANCER CENTER ONLY)
Abs Immature Granulocytes: 0.04 10*3/uL (ref 0.00–0.07)
Basophils Absolute: 0 10*3/uL (ref 0.0–0.1)
Basophils Relative: 1 %
Eosinophils Absolute: 1.1 10*3/uL — ABNORMAL HIGH (ref 0.0–0.5)
Eosinophils Relative: 21 %
HCT: 25 % — ABNORMAL LOW (ref 39.0–52.0)
Hemoglobin: 8.3 g/dL — ABNORMAL LOW (ref 13.0–17.0)
Immature Granulocytes: 1 %
Lymphocytes Relative: 2 %
Lymphs Abs: 0.1 10*3/uL — ABNORMAL LOW (ref 0.7–4.0)
MCH: 29 pg (ref 26.0–34.0)
MCHC: 33.2 g/dL (ref 30.0–36.0)
MCV: 87.4 fL (ref 80.0–100.0)
Monocytes Absolute: 0.7 10*3/uL (ref 0.1–1.0)
Monocytes Relative: 12 %
Neutro Abs: 3.4 10*3/uL (ref 1.7–7.7)
Neutrophils Relative %: 63 %
Platelet Count: 104 10*3/uL — ABNORMAL LOW (ref 150–400)
RBC: 2.86 MIL/uL — ABNORMAL LOW (ref 4.22–5.81)
RDW: 13.7 % (ref 11.5–15.5)
WBC Count: 5.4 10*3/uL (ref 4.0–10.5)
nRBC: 0 % (ref 0.0–0.2)

## 2018-08-18 NOTE — Telephone Encounter (Signed)
Spoke with Karie Kirks to let her know the results of the patients lab work.  Hemoglobin is trending in the right direction.  Plan to repeat labs on Friday.  She verbalized understanding.  Will continue to follow as necessary.  Gloriajean Dell. Leonie Green, BSN

## 2018-08-19 ENCOUNTER — Other Ambulatory Visit: Payer: Self-pay

## 2018-08-19 ENCOUNTER — Telehealth: Payer: Self-pay | Admitting: *Deleted

## 2018-08-19 ENCOUNTER — Encounter: Payer: Self-pay | Admitting: Radiation Oncology

## 2018-08-19 ENCOUNTER — Ambulatory Visit
Admission: RE | Admit: 2018-08-19 | Discharge: 2018-08-19 | Disposition: A | Discharge status: Home / Self Care | Payer: Medicare Other | Source: Ambulatory Visit | Attending: Radiation Oncology | Admitting: Radiation Oncology

## 2018-08-19 DIAGNOSIS — N184 Chronic kidney disease, stage 4 (severe): Secondary | ICD-10-CM | POA: Diagnosis not present

## 2018-08-19 DIAGNOSIS — Z510 Encounter for antineoplastic radiation therapy: Secondary | ICD-10-CM | POA: Diagnosis not present

## 2018-08-19 DIAGNOSIS — I130 Hypertensive heart and chronic kidney disease with heart failure and stage 1 through stage 4 chronic kidney disease, or unspecified chronic kidney disease: Secondary | ICD-10-CM | POA: Diagnosis not present

## 2018-08-19 DIAGNOSIS — C163 Malignant neoplasm of pyloric antrum: Secondary | ICD-10-CM | POA: Diagnosis not present

## 2018-08-19 DIAGNOSIS — D500 Iron deficiency anemia secondary to blood loss (chronic): Secondary | ICD-10-CM | POA: Diagnosis not present

## 2018-08-19 DIAGNOSIS — I5032 Chronic diastolic (congestive) heart failure: Secondary | ICD-10-CM | POA: Diagnosis not present

## 2018-08-19 NOTE — Telephone Encounter (Signed)
Called Leonard [name] to inform that Leonard [name] has labs for tomorrow (08/20/18) @ 10 am, spoke with Leonard [name] and he verified understanding this and he will tell the patient

## 2018-08-19 NOTE — Telephone Encounter (Signed)
XXXX 

## 2018-08-20 ENCOUNTER — Other Ambulatory Visit: Payer: Self-pay

## 2018-08-20 ENCOUNTER — Ambulatory Visit
Admission: RE | Admit: 2018-08-20 | Discharge: 2018-08-20 | Disposition: A | Discharge status: Home / Self Care | Payer: Medicare Other | Source: Ambulatory Visit | Attending: Radiation Oncology | Admitting: Radiation Oncology

## 2018-08-20 DIAGNOSIS — I5032 Chronic diastolic (congestive) heart failure: Secondary | ICD-10-CM | POA: Diagnosis not present

## 2018-08-20 DIAGNOSIS — N184 Chronic kidney disease, stage 4 (severe): Secondary | ICD-10-CM | POA: Diagnosis not present

## 2018-08-20 DIAGNOSIS — I130 Hypertensive heart and chronic kidney disease with heart failure and stage 1 through stage 4 chronic kidney disease, or unspecified chronic kidney disease: Secondary | ICD-10-CM | POA: Diagnosis not present

## 2018-08-20 DIAGNOSIS — C163 Malignant neoplasm of pyloric antrum: Secondary | ICD-10-CM | POA: Diagnosis not present

## 2018-08-20 DIAGNOSIS — Z510 Encounter for antineoplastic radiation therapy: Secondary | ICD-10-CM | POA: Diagnosis not present

## 2018-08-20 DIAGNOSIS — D500 Iron deficiency anemia secondary to blood loss (chronic): Secondary | ICD-10-CM | POA: Diagnosis not present

## 2018-08-20 LAB — CBC WITH DIFFERENTIAL (CANCER CENTER ONLY)
Abs Immature Granulocytes: 0.02 10*3/uL (ref 0.00–0.07)
Basophils Absolute: 0 10*3/uL (ref 0.0–0.1)
Basophils Relative: 1 %
Eosinophils Absolute: 1 10*3/uL — ABNORMAL HIGH (ref 0.0–0.5)
Eosinophils Relative: 19 %
HCT: 25.7 % — ABNORMAL LOW (ref 39.0–52.0)
Hemoglobin: 8.6 g/dL — ABNORMAL LOW (ref 13.0–17.0)
Immature Granulocytes: 0 %
Lymphocytes Relative: 1 %
Lymphs Abs: 0.1 10*3/uL — ABNORMAL LOW (ref 0.7–4.0)
MCH: 29.3 pg (ref 26.0–34.0)
MCHC: 33.5 g/dL (ref 30.0–36.0)
MCV: 87.4 fL (ref 80.0–100.0)
Monocytes Absolute: 0.6 10*3/uL (ref 0.1–1.0)
Monocytes Relative: 10 %
Neutro Abs: 3.7 10*3/uL (ref 1.7–7.7)
Neutrophils Relative %: 69 %
Platelet Count: 101 10*3/uL — ABNORMAL LOW (ref 150–400)
RBC: 2.94 MIL/uL — ABNORMAL LOW (ref 4.22–5.81)
RDW: 13.8 % (ref 11.5–15.5)
WBC Count: 5.4 10*3/uL (ref 4.0–10.5)
nRBC: 0 % (ref 0.0–0.2)

## 2018-08-23 DIAGNOSIS — C801 Malignant (primary) neoplasm, unspecified: Secondary | ICD-10-CM | POA: Diagnosis not present

## 2018-08-23 DIAGNOSIS — D649 Anemia, unspecified: Secondary | ICD-10-CM | POA: Diagnosis not present

## 2018-08-23 DIAGNOSIS — D002 Carcinoma in situ of stomach: Secondary | ICD-10-CM | POA: Diagnosis not present

## 2018-08-23 DIAGNOSIS — D500 Iron deficiency anemia secondary to blood loss (chronic): Secondary | ICD-10-CM | POA: Diagnosis not present

## 2018-08-24 ENCOUNTER — Ambulatory Visit: Payer: Medicare Other

## 2018-08-24 ENCOUNTER — Other Ambulatory Visit: Payer: Self-pay | Admitting: Radiation Oncology

## 2018-08-24 DIAGNOSIS — C163 Malignant neoplasm of pyloric antrum: Secondary | ICD-10-CM

## 2018-08-25 ENCOUNTER — Other Ambulatory Visit: Payer: Self-pay

## 2018-08-25 ENCOUNTER — Telehealth: Payer: Self-pay | Admitting: *Deleted

## 2018-08-25 ENCOUNTER — Ambulatory Visit
Admission: RE | Admit: 2018-08-25 | Discharge: 2018-08-25 | Disposition: A | Discharge status: Home / Self Care | Payer: Medicare Other | Source: Ambulatory Visit | Attending: Radiation Oncology | Admitting: Radiation Oncology

## 2018-08-25 DIAGNOSIS — C163 Malignant neoplasm of pyloric antrum: Secondary | ICD-10-CM | POA: Diagnosis not present

## 2018-08-25 DIAGNOSIS — D500 Iron deficiency anemia secondary to blood loss (chronic): Secondary | ICD-10-CM | POA: Diagnosis not present

## 2018-08-25 DIAGNOSIS — N184 Chronic kidney disease, stage 4 (severe): Secondary | ICD-10-CM | POA: Diagnosis not present

## 2018-08-25 DIAGNOSIS — I130 Hypertensive heart and chronic kidney disease with heart failure and stage 1 through stage 4 chronic kidney disease, or unspecified chronic kidney disease: Secondary | ICD-10-CM | POA: Diagnosis not present

## 2018-08-25 DIAGNOSIS — Z510 Encounter for antineoplastic radiation therapy: Secondary | ICD-10-CM | POA: Diagnosis not present

## 2018-08-25 DIAGNOSIS — I5032 Chronic diastolic (congestive) heart failure: Secondary | ICD-10-CM | POA: Diagnosis not present

## 2018-08-25 LAB — CBC (CANCER CENTER ONLY)
HCT: 24.3 % — ABNORMAL LOW (ref 39.0–52.0)
Hemoglobin: 7.9 g/dL — ABNORMAL LOW (ref 13.0–17.0)
MCH: 29 pg (ref 26.0–34.0)
MCHC: 32.5 g/dL (ref 30.0–36.0)
MCV: 89.3 fL (ref 80.0–100.0)
Platelet Count: 91 10*3/uL — ABNORMAL LOW (ref 150–400)
RBC: 2.72 MIL/uL — ABNORMAL LOW (ref 4.22–5.81)
RDW: 14.1 % (ref 11.5–15.5)
WBC Count: 7.2 10*3/uL (ref 4.0–10.5)
nRBC: 0 % (ref 0.0–0.2)

## 2018-08-25 NOTE — Telephone Encounter (Signed)
Spoke with Leonard [name] to give her an update on her uncle's status.  I let her know his hemoglobin did drop a little and per PA Perkins we would not resort to giving him blood products until he was symptomatic.  Conversations with his GI doctor, kidney doctor and his surgeon are ongoing to determine who will manage his anemia going forward.  She verbalized understanding.  Will continue to follow as necessary.  Leonard [name]. Leonie Green, BSN

## 2018-08-26 ENCOUNTER — Telehealth: Payer: Self-pay | Admitting: *Deleted

## 2018-08-26 NOTE — Telephone Encounter (Signed)
[place] OF AN APPT. FOR THIS PATIENT WITH DR. COLADONATO OF [facility] ON 09-08-18 - ARRIVAL TIME- 4:15 PM, SPOKE WITH SCHEDULER @ [facility] AND THEY ARE AWARE OF THIS APPT.

## 2018-08-27 DIAGNOSIS — D002 Carcinoma in situ of stomach: Secondary | ICD-10-CM | POA: Diagnosis not present

## 2018-08-27 DIAGNOSIS — D649 Anemia, unspecified: Secondary | ICD-10-CM | POA: Diagnosis not present

## 2018-08-27 DIAGNOSIS — C801 Malignant (primary) neoplasm, unspecified: Secondary | ICD-10-CM | POA: Diagnosis not present

## 2018-08-27 DIAGNOSIS — R41841 Cognitive communication deficit: Secondary | ICD-10-CM | POA: Diagnosis not present

## 2018-08-30 DIAGNOSIS — D464 Refractory anemia, unspecified: Secondary | ICD-10-CM | POA: Diagnosis not present

## 2018-08-30 DIAGNOSIS — D649 Anemia, unspecified: Secondary | ICD-10-CM | POA: Diagnosis not present

## 2018-09-02 ENCOUNTER — Telehealth: Payer: Self-pay | Admitting: Radiation Oncology

## 2018-09-02 DIAGNOSIS — D464 Refractory anemia, unspecified: Secondary | ICD-10-CM | POA: Diagnosis not present

## 2018-09-02 DIAGNOSIS — D631 Anemia in chronic kidney disease: Secondary | ICD-10-CM | POA: Diagnosis not present

## 2018-09-02 DIAGNOSIS — D649 Anemia, unspecified: Secondary | ICD-10-CM | POA: Diagnosis not present

## 2018-09-02 NOTE — Telephone Encounter (Signed)
I called and spoke with Karie Kirks and [name], the [facility] for Mr. Houseworth to let them know our plan to recheck hgb today in the facility, and hopefully go to weekly lab checks if stable. We talked about how his kidney disease plays into the low hgb level, and that his nephrologist, surgeon, and endoscopist have been including in group discussion. Our current plan is weekly cbcs drawn in the facility and if stable repeat EGD with Dr. Rush Landmark q3-6 months, with the hopes of avoiding surgery. Surgery would be in the setting of persistent/progressive disease. If his counts are not stable now requiring transfusion, this could hasten the need for GI and surgery to be more urgently involved.

## 2018-09-03 DIAGNOSIS — D002 Carcinoma in situ of stomach: Secondary | ICD-10-CM | POA: Diagnosis not present

## 2018-09-03 DIAGNOSIS — C801 Malignant (primary) neoplasm, unspecified: Secondary | ICD-10-CM | POA: Diagnosis not present

## 2018-09-03 DIAGNOSIS — D649 Anemia, unspecified: Secondary | ICD-10-CM | POA: Diagnosis not present

## 2018-09-03 DIAGNOSIS — D500 Iron deficiency anemia secondary to blood loss (chronic): Secondary | ICD-10-CM | POA: Diagnosis not present

## 2018-09-06 DIAGNOSIS — D500 Iron deficiency anemia secondary to blood loss (chronic): Secondary | ICD-10-CM | POA: Diagnosis not present

## 2018-09-06 DIAGNOSIS — D649 Anemia, unspecified: Secondary | ICD-10-CM | POA: Diagnosis not present

## 2018-09-06 DIAGNOSIS — D002 Carcinoma in situ of stomach: Secondary | ICD-10-CM | POA: Diagnosis not present

## 2018-09-06 DIAGNOSIS — D464 Refractory anemia, unspecified: Secondary | ICD-10-CM | POA: Diagnosis not present

## 2018-09-06 DIAGNOSIS — C801 Malignant (primary) neoplasm, unspecified: Secondary | ICD-10-CM | POA: Diagnosis not present

## 2018-09-08 DIAGNOSIS — N2581 Secondary hyperparathyroidism of renal origin: Secondary | ICD-10-CM | POA: Diagnosis not present

## 2018-09-08 DIAGNOSIS — D696 Thrombocytopenia, unspecified: Secondary | ICD-10-CM | POA: Diagnosis not present

## 2018-09-08 DIAGNOSIS — J449 Chronic obstructive pulmonary disease, unspecified: Secondary | ICD-10-CM | POA: Diagnosis not present

## 2018-09-08 DIAGNOSIS — F17201 Nicotine dependence, unspecified, in remission: Secondary | ICD-10-CM | POA: Diagnosis not present

## 2018-09-08 DIAGNOSIS — N189 Chronic kidney disease, unspecified: Secondary | ICD-10-CM | POA: Diagnosis not present

## 2018-09-08 DIAGNOSIS — N179 Acute kidney failure, unspecified: Secondary | ICD-10-CM | POA: Diagnosis not present

## 2018-09-08 DIAGNOSIS — E1122 Type 2 diabetes mellitus with diabetic chronic kidney disease: Secondary | ICD-10-CM | POA: Diagnosis not present

## 2018-09-08 DIAGNOSIS — N184 Chronic kidney disease, stage 4 (severe): Secondary | ICD-10-CM | POA: Diagnosis not present

## 2018-09-08 DIAGNOSIS — I5030 Unspecified diastolic (congestive) heart failure: Secondary | ICD-10-CM | POA: Diagnosis not present

## 2018-09-08 DIAGNOSIS — E1152 Type 2 diabetes mellitus with diabetic peripheral angiopathy with gangrene: Secondary | ICD-10-CM | POA: Diagnosis not present

## 2018-09-08 DIAGNOSIS — I2510 Atherosclerotic heart disease of native coronary artery without angina pectoris: Secondary | ICD-10-CM | POA: Diagnosis not present

## 2018-09-08 DIAGNOSIS — I129 Hypertensive chronic kidney disease with stage 1 through stage 4 chronic kidney disease, or unspecified chronic kidney disease: Secondary | ICD-10-CM | POA: Diagnosis not present

## 2018-09-08 DIAGNOSIS — D631 Anemia in chronic kidney disease: Secondary | ICD-10-CM | POA: Diagnosis not present

## 2018-09-13 ENCOUNTER — Telehealth: Payer: Self-pay | Admitting: *Deleted

## 2018-09-13 ENCOUNTER — Other Ambulatory Visit: Payer: Self-pay | Admitting: *Deleted

## 2018-09-13 ENCOUNTER — Other Ambulatory Visit: Payer: Self-pay | Admitting: Radiation Oncology

## 2018-09-13 DIAGNOSIS — C163 Malignant neoplasm of pyloric antrum: Secondary | ICD-10-CM

## 2018-09-13 DIAGNOSIS — D500 Iron deficiency anemia secondary to blood loss (chronic): Secondary | ICD-10-CM | POA: Diagnosis not present

## 2018-09-13 DIAGNOSIS — D002 Carcinoma in situ of stomach: Secondary | ICD-10-CM | POA: Diagnosis not present

## 2018-09-13 DIAGNOSIS — D464 Refractory anemia, unspecified: Secondary | ICD-10-CM | POA: Diagnosis not present

## 2018-09-13 DIAGNOSIS — D631 Anemia in chronic kidney disease: Secondary | ICD-10-CM

## 2018-09-13 DIAGNOSIS — D649 Anemia, unspecified: Secondary | ICD-10-CM | POA: Diagnosis not present

## 2018-09-13 DIAGNOSIS — C801 Malignant (primary) neoplasm, unspecified: Secondary | ICD-10-CM | POA: Diagnosis not present

## 2018-09-13 MED ORDER — SODIUM CHLORIDE 0.9% FLUSH: 10.0000 mL | INTRAVENOUS | Status: DC | PRN

## 2018-09-13 MED ORDER — HEPARIN SOD (PORK) LOCK FLUSH 100 UNIT/ML IV SOLN: 500.0000 [IU] | Freq: Every day | INTRAVENOUS | Status: DC | PRN

## 2018-09-13 MED ORDER — DIPHENHYDRAMINE HCL 25 MG PO CAPS
25.0000 mg | ORAL_CAPSULE | Freq: Once | ORAL | Status: DC
  Filled 2018-09-13: qty 1

## 2018-09-13 MED ORDER — ACETAMINOPHEN 325 MG PO TABS
650.0000 mg | ORAL_TABLET | Freq: Once | ORAL | Status: DC
  Filled 2018-09-13: qty 2

## 2018-09-13 MED ORDER — SODIUM CHLORIDE 0.9% FLUSH: 3.0000 mL | INTRAVENOUS | Status: DC | PRN

## 2018-09-13 MED ORDER — SODIUM CHLORIDE 0.9% IV SOLUTION
250.0000 mL | Freq: Once | INTRAVENOUS | Status: DC
  Filled 2018-09-13: qty 250

## 2018-09-13 MED ORDER — HEPARIN SOD (PORK) LOCK FLUSH 100 UNIT/ML IV SOLN: 250.0000 [IU] | INTRAVENOUS | Status: DC | PRN

## 2018-09-13 NOTE — Telephone Encounter (Signed)
Spoke with Leonard [name] to inform her that Leonard [name]'s hemoglobin is down to 6.6.  I let her know that we plan to give him 2 units of blood tomorrow 8/11.  I let her know that [name] has reached out to general surgery and GI as well.  He has an upcoming appointment with [name].  Will continue to follow as necessary.  Gloriajean Dell. Leonie Green, BSN

## 2018-09-14 ENCOUNTER — Inpatient Hospital Stay: Payer: Medicare Other

## 2018-09-14 ENCOUNTER — Other Ambulatory Visit: Payer: Self-pay

## 2018-09-14 ENCOUNTER — Inpatient Hospital Stay: Payer: Medicare Other | Attending: Radiation Oncology

## 2018-09-14 ENCOUNTER — Telehealth: Payer: Self-pay

## 2018-09-14 DIAGNOSIS — C163 Malignant neoplasm of pyloric antrum: Secondary | ICD-10-CM | POA: Insufficient documentation

## 2018-09-14 DIAGNOSIS — Z79899 Other long term (current) drug therapy: Secondary | ICD-10-CM | POA: Diagnosis not present

## 2018-09-14 DIAGNOSIS — D631 Anemia in chronic kidney disease: Secondary | ICD-10-CM

## 2018-09-14 LAB — PREPARE RBC (CROSSMATCH)

## 2018-09-14 MED ORDER — SODIUM CHLORIDE 0.9 % IV SOLN
INTRAVENOUS | Status: DC
  Administered 2018-09-14: 12:00:00 via INTRAVENOUS
  Filled 2018-09-14: qty 250

## 2018-09-14 MED ORDER — SODIUM CHLORIDE 0.9% IV SOLUTION
250.0000 mL | Freq: Once | INTRAVENOUS | Status: AC
  Administered 2018-09-14: 250 mL via INTRAVENOUS
  Filled 2018-09-14: qty 250

## 2018-09-14 MED ORDER — DIPHENHYDRAMINE HCL 25 MG PO CAPS
ORAL_CAPSULE | ORAL | Status: AC
  Filled 2018-09-14: qty 1

## 2018-09-14 MED ORDER — DIPHENHYDRAMINE HCL 25 MG PO CAPS
25.0000 mg | ORAL_CAPSULE | Freq: Once | ORAL | Status: AC
  Administered 2018-09-14: 25 mg via ORAL

## 2018-09-14 MED ORDER — ACETAMINOPHEN 325 MG PO TABS
ORAL_TABLET | ORAL | Status: AC
  Filled 2018-09-14: qty 2

## 2018-09-14 MED ORDER — ACETAMINOPHEN 325 MG PO TABS
650.0000 mg | ORAL_TABLET | Freq: Once | ORAL | Status: AC
  Administered 2018-09-14: 09:00:00 650 mg via ORAL

## 2018-09-14 NOTE — Telephone Encounter (Signed)
----- Message from Irving Copas., MD sent at 09/14/2018  2:46 PM EDT ----- OK, we'll try and get him in for an EGD. Hopefully he comes into clinic later this week. Leonard [name], let's start looking for an EGD time slot at one of the hospitals in the next 3-weeks or so.  Standard EGD time slot. Thanks. Leonard [name] ----- Message ----- From: Leonard Klein, MD Sent: 09/14/2018   2:25 PM EDT To: Leonard Heinz, MD, Leonard Rudd, MD, #  I concur. Fb ----- Message ----- From: Leonard Rudd, MD Sent: 09/14/2018   8:58 AM EDT To: Leonard Klein, MD, Leonard Heinz, MD, #  Thanks. He received a full course of XRT, so was hopeful things would be stable at least for now. I think EGD would be reasonable to consider - would provide some additional info about what is going on. With limited treatment options, I would also think Oncology/ Palliative would make sense depending on findings and his wishes.  Leonard [name] ----- Message ----- From: Irving Copas., MD Sent: 09/13/2018   2:34 PM EDT To: Leonard Klein, MD, Leonard Heinz, MD, #  He is scheduled to see me in clinic this week if he shows up. Leonard [name] and Leonard [name] and Leonard [name], I can certainly perform an EGD in the coming weeks. I'm not sure what our end-point is however, if radiation has not been enough to decrease his bleeding. If he remains a non-surgical candidate, what should we do.. Shall we get Oncology back in the game and a Palliative medicine goals of care discussion? Thanks. Leonard [name] ----- Message ----- From: Leonard Pedro, PA-C Sent: 09/13/2018   1:59 PM EDT To: Leonard Klein, MD, Leonard Heinz, MD, #  Sorry to have to revisit this- but Leonard [name]'s hgb has been stable in the low 8 range since our last discussions below. Unfortunately today his weekly hgb check was 6.6. He's getting 2 units of blood tomorrow, rechecking hgb on Thursday, then again next Monday if stable following transfusion. Let me know what I can do to help, but it sounds  like he's forcing our hands into procedures?! His last treatment was on 7/16, and that completed his treatment to 54 Gy/28 fxns. Thanks,Leonard [name]  ----- Message ----- From: Irving Copas., MD Sent: 08/26/2018   8:25 AM EDT To: Leonard Klein, MD, Leonard Heinz, MD, #  Happy to be available for relook. Thanks. Leonard [name] ----- Message ----- From: Leonard Pedro, PA-C Sent: 08/26/2018   8:19 AM EDT To: Leonard Klein, MD, Leonard Heinz, MD, #  Under normal circumstances we wouldn't want him scoped for the reasons you point out for at least 3 months. Should we make sure he has follow up with nephrology, and have weekly labs in the facility, and if he's dropping then take a look?  Thanks,Leonard [name]  ----- Message ----- From: Irving Copas., MD Sent: 08/25/2018  12:47 PM EDT To: Leonard Klein, MD, Leonard Heinz, MD, #  Thanks for the udpate. He missed another appointment with [place] in the GI office. Leonard [name] and Leonard [name], since his radiation is now over, I would be happy to talk with him to do a relook EGD, however, I suspect that it will be difficult to discern radiation changes but we can certainly look at whether he may still be having a high-risk lesion that would be causing his progressive anemia. There won't be an endoscopic approach to treating but if it helps [place], we can. When do you think we should relook. Obviously, our thoughts have  been this is all gastric cancer related, but he hasn't had a colonoscopy, and we could certainly consider this for academic purposes, but suspicion remains that it will be from his gastric cancer. Leonard [name] ----- Message ----- From: Leonard Pedro, PA-C Sent: 08/25/2018  10:17 AM EDT To: Leonard Klein, MD, Leonard Heinz, MD, #  Good morning, I wanted to reach out about Leonard [name]. As you know he has a history of chronic kidney disease as well as a recently diagnosed stomach cancer. We finished definitive radiation last week. He did not  get chemo due to his performance status, and oncology is not following him. He had been having acute on chronic blood loss from his tumor and was hospitalized on multiple occasions with multiple transfusions of blood products after hemoglobins dipped into the 4-5 range. He is still in a facility and I've been working with the NP at the facility to keep tabs on his hgb. He did drop about a gram and we gave him 1 unit which seemed to help his numbers, but he's starting to drift back down. He's not having hematemesis anymore like he had been. I suspect his kidney disease is also playing a role in this, but I'm not sure how aggressive we should be at this point about blood products, presuming that he's no longer bleeding from his tumor.   Dr. Marval Regal- would you be able to look at his counts and weigh in? I'm  also unsure of who he is to see in surveillance, but suspect he'd need another EGD in the coming weeks/months to assess response. I just don't want this guy at the mercy of the facility he's in trying to manage his multifactorial anemia, because all they can do is check labs twice a week and send him to the ED if they drop significantly, they don't have a way to order outpt blood products.   Thank you all for your input! Leonard [name]

## 2018-09-14 NOTE — Patient Instructions (Signed)

## 2018-09-15 LAB — TYPE AND SCREEN
ABO/RH(D): B NEG
Antibody Screen: NEGATIVE
Unit division: 0
Unit division: 0

## 2018-09-15 LAB — BPAM RBC
Blood Product Expiration Date: 202008172359
Blood Product Expiration Date: 202008262359
ISSUE DATE / TIME: 202008110946
ISSUE DATE / TIME: 202008110946
Unit Type and Rh: 1700
Unit Type and Rh: 1700

## 2018-09-15 NOTE — Telephone Encounter (Signed)
Thanks Ro you are the best. Note where he has missed a couple of appointments

## 2018-09-16 ENCOUNTER — Encounter: Payer: Self-pay | Admitting: Gastroenterology

## 2018-09-16 ENCOUNTER — Ambulatory Visit (INDEPENDENT_AMBULATORY_CARE_PROVIDER_SITE_OTHER): Payer: Medicare Other | Admitting: Gastroenterology

## 2018-09-16 VITALS — BP 150/60 | HR 72 | Temp 98.4°F | Ht 68.0 in

## 2018-09-16 DIAGNOSIS — D464 Refractory anemia, unspecified: Secondary | ICD-10-CM | POA: Diagnosis not present

## 2018-09-16 DIAGNOSIS — N184 Chronic kidney disease, stage 4 (severe): Secondary | ICD-10-CM | POA: Diagnosis not present

## 2018-09-16 DIAGNOSIS — D500 Iron deficiency anemia secondary to blood loss (chronic): Secondary | ICD-10-CM

## 2018-09-16 DIAGNOSIS — C169 Malignant neoplasm of stomach, unspecified: Secondary | ICD-10-CM | POA: Diagnosis not present

## 2018-09-16 DIAGNOSIS — D631 Anemia in chronic kidney disease: Secondary | ICD-10-CM | POA: Diagnosis not present

## 2018-09-16 DIAGNOSIS — D649 Anemia, unspecified: Secondary | ICD-10-CM | POA: Diagnosis not present

## 2018-09-16 MED ORDER — PEG 3350-KCL-NA BICARB-NACL 420 G PO SOLR: 4000.0000 mL | Freq: Once | ORAL | 0 refills | Status: AC

## 2018-09-16 NOTE — Progress Notes (Signed)
[facility] VISIT   Primary Care Provider Winfrey, Alcario Drought, MD [address] [place] [address] [phone]  Patient Profile: Leonard [name] is a 68 y.o. male with a pmh significant for CHF, CAD, chronic renal insufficiency, PAD (status post AKA), COPD, diabetes, hypothyroidism, hypertension, MGUS, hyperlipidemia, Gastric cancer (status post XRT as currently deemed a non-chemo or surgical candidate).  The patient presents to the [facility] for an evaluation and management of problem(s) noted below:  Problem List 1. Gastric adenocarcinoma ([place])   2. Anemia due to chronic blood loss   3. Anemia due to stage 4 chronic kidney disease (HCC)     History of Present Illness Please see initial consultation note by Dr. Fuller Plan as well as inpatient consultation note and progress note for full details of HPI.  Interval History The patient has completed his palliative XRT for his known gastric adenocarcinoma.  Unfortunately, the patient continues to have recurrent blood loss anemia presumed to be from his gastric ulcer/cancer.  He has remained a nonsurgical candidate based on prior notation however he continues to require blood products.  There is been concern about initiating chemotherapy on him as this could lead him to require hemodialysis if he were to have progressive renal failure developed from chemotherapy and thus chemotherapy was not offered to him either.  The patient feels stuck.  He has to go and get blood just to live but does not enjoy doing this but is willing to do this as he wants to live.  He denies seeing dark stools or melanic stools or hematochezia or maroon stools.  He is not up-to-date from a colonoscopy perspective.  Patient describes some mild early satiety but is able to eat slowly and allow things to pass.  He has pyrosis but denies any dysphagia.  The patient is willing and open to try anything if it allows him to live  longer and would like to have surgery if at all possible.  Our radiation oncologist have been working with the patient's facility and he has been receiving periodic blood transfusions to maintain his hemoglobin and keep him out of the hospital if possible.  GI Review of Systems Positive as above Negative for odynophagia, bloating, nausea, vomiting  Review of Systems General: Denies fevers/chills HEENT: Denies oral lesions Cardiovascular: Denies chest pain Pulmonary: Denies shortness of breath Gastroenterological: See HPI Genitourinary: Denies darkened urine or hematuria Hematological: Denies easy bruising/bleeding Dermatological: Denies jaundice Psychological: Mood is stable   Medications Current Outpatient Medications  Medication Sig Dispense Refill   acetaminophen (TYLENOL) 325 MG tablet Take 650 mg by mouth every 6 (six) hours as needed for fever.     amLODipine (NORVASC) 5 MG tablet Take 1 tablet (5 mg total) by mouth daily.     ascorbic acid (VITAMIN C) 500 MG tablet Take 500 mg by mouth daily.     atorvastatin (LIPITOR) 40 MG tablet TAKE ONE (1) TABLET BY MOUTH EVERY DAY AT 6:00PM (Patient taking differently: Take 40 mg by mouth daily at 6 PM. ) 90 tablet 3   Blood Glucose Monitoring Suppl (ONETOUCH VERIO FLEX SYSTEM) w/Device KIT 1 kit by Does not apply route daily. 1 kit 3   carvedilol (COREG) 12.5 MG tablet Take 1 tablet (12.5 mg total) by mouth 2 (two) times daily with a meal. 60 tablet 0   cholecalciferol (VITAMIN D) 25 MCG (1000 UT) tablet Take 2,000 Units by mouth 3 (three) times daily.  cloNIDine (CATAPRES - DOSED IN MG/24 HR) 0.2 mg/24hr patch Place 1 patch (0.2 mg total) onto the skin once a week. 4 patch 0   Darbepoetin Alfa (ARANESP) 40 MCG/0.4ML SOSY injection Inject 0.4 mLs (40 mcg total) into the skin every 30 (thirty) days. 8.4 mL 2   doxazosin (CARDURA) 8 MG tablet Take 1 tablet (8 mg total) by mouth every evening. (Patient taking differently: Take 8  mg by mouth daily. )     feeding supplement, ENSURE ENLIVE, (ENSURE ENLIVE) LIQD Take 237 mLs by mouth 3 (three) times daily between meals. 237 mL 12   ferrous sulfate (FEROSUL) 325 (65 FE) MG tablet Take 1 tablet (325 mg total) by mouth daily at 12 noon. (Patient taking differently: Take 325 mg by mouth daily. ) 30 tablet 0   glucose blood (ONETOUCH VERIO) test strip Use as instructed to test three times daily. ICD-10 code: E11.22 100 each 12   Glycopyrrolate (LONHALA MAGNAIR STARTER KIT) 25 MCG/ML SOLN Inhale 1 mL into the lungs 2 (two) times a day.     hydrALAZINE (APRESOLINE) 100 MG tablet TAKE ONE TABLET BY MOUTH EVERY EIGHT HOURS (Patient taking differently: Take 100 mg by mouth every 8 (eight) hours. ) 90 tablet 1   insulin aspart (NOVOLOG) 100 UNIT/ML injection Inject 0-10 Units into the skin 3 (three) times daily before meals. <150= 0 units, 151-200= 2 units, 201-250= 4 units, 251-300= 6 units, 301-350= 8 units, 351-400= 10 units     ipratropium-albuterol (DUONEB) 0.5-2.5 (3) MG/3ML SOLN Take 3 mLs by nebulization every 6 (six) hours as needed (sob).     isosorbide mononitrate (IMDUR) 120 MG 24 hr tablet Take 1 tablet (120 mg total) by mouth daily. 30 tablet 0   levothyroxine (SYNTHROID, LEVOTHROID) 100 MCG tablet Take 100 mcg by mouth daily before breakfast.      pantoprazole (PROTONIX) 40 MG tablet Take 1 tablet (40 mg total) by mouth 2 (two) times daily.     polyethylene glycol (MIRALAX / GLYCOLAX) packet Take 17 g by mouth daily as needed for mild constipation or moderate constipation. 14 each 0   torsemide (DEMADEX) 20 MG tablet Take 2 tablets (40 mg total) by mouth daily. 120 tablet 11   umeclidinium bromide (INCRUSE ELLIPTA) 62.5 MCG/INH AEPB Inhale 1 puff into the lungs daily. 30 each 0   nitroGLYCERIN (NITROSTAT) 0.4 MG SL tablet Place 1 tablet (0.4 mg total) under the tongue every 5 (five) minutes as needed for chest pain. (Patient not taking: Reported on 09/16/2018) 30  tablet 12   ondansetron (ZOFRAN) 4 MG tablet Take 4 mg by mouth every 8 (eight) hours as needed for nausea or vomiting.     ONETOUCH DELICA LANCETS 24M MISC 1 each by Does not apply route 3 (three) times daily. ICD-10 code: E11.22 (Patient not taking: Reported on 07/15/2018) 100 each 12   No current facility-administered medications for this visit.    Facility-Administered Medications Ordered in Other Visits  Medication Dose Route Frequency Provider Last Rate Last Dose   0.9 %  sodium chloride infusion (Manually program via Guardrails IV Fluids)  250 mL Intravenous Once Hayden Pedro, PA-C       sodium chloride flush (NS) 0.9 % injection 10 mL  10 mL Intracatheter PRN Heath Lark, MD        Allergies Allergies  Allergen Reactions   Penicillins Other (See Comments)    Has patient had a PCN reaction causing immediate rash, facial/tongue/throat swelling, SOB or lightheadedness  with hypotension: Unknown Has patient had a PCN reaction causing severe rash involving mucus membranes or skin necrosis: Unknown Has patient had a PCN reaction that required hospitalization: Unknown Has patient had a PCN reaction occurring within the last 10 years: Unknown If all of the above answers are "NO", then may proceed with Cephalosporin use.     Histories Past Medical History:  Diagnosis Date   Acute on chronic diastolic heart failure ([place]) 07/04/2014   Anemia of renal disease    Atherosclerotic peripheral vascular disease with gangrene ([place]) 02/14/2017   CAD (coronary artery disease) 04/06/2013   Cancer (HCC)    skin cancer-   CHF (congestive heart failure) (HCC)    Chronic anemia 02/28/2016   Chronic deep vein thrombosis (DVT) of femoral vein of left lower extremity ([place]) 09/03/2016   Chronic respiratory failure with hypoxia ([place]) 08/25/2017   CKD (chronic kidney disease) stage 4, GFR 15-29 ml/min ([place]) 01/15/2015   Follows with [facility].  Last visits:  07/23/16: No change in  meds. Worried that if we increase diuretics could lead to another AKI   CKD (chronic kidney disease), stage IV ([place])    07/07/2017   Claudication ([place])    Collapse of left talus 01/14/2017   COPD GOLD III  02/16/2014   Quit smoking 2014  - PFT's  04/06/13   FEV1 1.28 (44 % ) ratio 48  p no % improvement from saba p ?  prior to study with DLCO  43/54 % corrects to 97  % for alv volume - 01/31/2016  After extensive coaching HFA effectiveness =    75% from baseline 0 > changed to sprivia respimat   - PFT's  03/17/2016  FEV1 2.25  (81 % ) ratio 88  p 2 % improvement from saba p spiriva respimat x 2  prior to study with   Coronary artery disease    Diabetes ([place])    Diabetic polyneuropathy associated with type 2 diabetes mellitus ([place]) 01/14/2017   Diabetic retinopathy ([place]) 11/13/2015   Per Dr. Erenest Rasher eye exam in 10/17/15, mild background diabetic retinopathy   Fatigue associated with anemia 01/21/2016   Gangrene of toe ([place])    right 5th/notes 01/04/2013    HCAP (healthcare-associated pneumonia)    Hematuria 06/27/2015   High cholesterol    History of tobacco use 08/23/2013   Smoked pack per day for 46 years Quit smoking - 02/03/2013    Hypertension    Hypothyroidism 11/22/2014   Idiopathic chronic gout of left foot without tophus    Iron deficiency anemia 08/03/2015   Left foot pain 10/29/2016   Long term (current) use of anticoagulants [Z79.01] 09/09/2016   MGUS (monoclonal gammopathy of unknown significance) 01/15/2015   Mixed hyperlipidemia 07/17/2008   Morbid obesity due to excess calories ([place]) 03/17/2016   PAD (peripheral artery disease) (HCC)    Pleural effusion    Pneumonitis 07/26/2017   Pulmonary artery hypertension ([place]) 07/25/2017   PVD - hx of Rt SFA PTA and s/p Rt 4-5th toe amp Dec 2014 04/05/2013   Resistant hypertension 11/25/2012   Respiratory failure ([place]) 08/13/2017   Respiratory failure with hypoxia ([place]) 09/23/2017   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)  Type II   Type II diabetes mellitus with complication [facility])    Past Surgical History:  Procedure Laterality Date   ABDOMINAL AORTOGRAM W/LOWER EXTREMITY N/A 03/12/2017   Procedure: ABDOMINAL AORTOGRAM W/LOWER EXTREMITY;  Surgeon: Conrad [place] Ridge, MD;  Location: [facility];  Service: Cardiovascular;  Laterality: N/A;   AMPUTATION Right 01/28/2013   Procedure: RAY AMPUTATION RIGHT  4th & 5th TOE;  Surgeon: Rosetta Posner, MD;  Location: [facility];  Service: Vascular;  Laterality: Right;   AMPUTATION Left 03/15/2017   Procedure: AMPUTATION ABOVE KNEE;  Surgeon: Waynetta Sandy, MD;  Location: [place];  Service: Vascular;  Laterality: Left;   ANGIOPLASTY / STENTING FEMORAL Right 01/13/2013   superficial femoral artery x 2 (3 mm x 60 mm, 4 mm x 60 mm)   AV FISTULA PLACEMENT Left 02/11/2016   Procedure: LEFT UPPER ARM BRACHIAL CEPHALIC ARTERIOVENOUS (AV) FISTULA CREATION;  Surgeon: Conrad Timnath, MD;  Location: [facility];  Service: Vascular;  Laterality: Left;   BIOPSY  03/29/2018   Procedure: BIOPSY;  Surgeon: Irving Copas., MD;  Location: [place];  Service: Gastroenterology;;   BIOPSY  05/31/2018   Procedure: BIOPSY;  Surgeon: Irving Copas., MD;  Location: [place];  Service: Gastroenterology;;   COLONOSCOPY W/ POLYPECTOMY     CORONARY ARTERY BYPASS GRAFT N/A 04/07/2013   Procedure: CORONARY ARTERY BYPASS GRAFTING (CABG);  Surgeon: Ivin Poot, MD;  Location: [facility];  Service: Open Heart Surgery;  Laterality: N/A;   ESOPHAGOGASTRODUODENOSCOPY (EGD) WITH PROPOFOL N/A 03/29/2018   Procedure: ESOPHAGOGASTRODUODENOSCOPY (EGD) WITH PROPOFOL;  Surgeon: Rush Landmark Telford Nab., MD;  Location: [place];  Service: Gastroenterology;   Laterality: N/A;   ESOPHAGOGASTRODUODENOSCOPY (EGD) WITH PROPOFOL N/A 05/31/2018   Procedure: ESOPHAGOGASTRODUODENOSCOPY (EGD) WITH PROPOFOL;  Surgeon: Rush Landmark Telford Nab., MD;  Location: [facility];  Service: Gastroenterology;  Laterality: N/A;   EUS N/A 05/31/2018   Procedure: UPPER ENDOSCOPIC ULTRASOUND (EUS) RADIAL;  Surgeon: Irving Copas., MD;  Location: [facility];  Service: Gastroenterology;  Laterality: N/A;   INTRAOPERATIVE TRANSESOPHAGEAL ECHOCARDIOGRAM N/A 04/07/2013   Procedure: INTRAOPERATIVE TRANSESOPHAGEAL ECHOCARDIOGRAM;  Surgeon: Ivin Poot, MD;  Location: [place];  Service: Open Heart Surgery;  Laterality: N/A;   IR FLUORO GUIDE CV LINE RIGHT  11/04/2017   IR REMOVAL TUN CV CATH W/O FL  11/27/2017   IR US GUIDE VASC ACCESS RIGHT  11/04/2017   LEFT HEART CATHETERIZATION WITH CORONARY ANGIOGRAM N/A 04/05/2013   Procedure: LEFT HEART CATHETERIZATION WITH CORONARY ANGIOGRAM;  Surgeon: Peter M Martinique, MD;  Location: [facility] CATH LAB;  Service: Cardiovascular;  Laterality: N/A;   LOWER EXTREMITY ANGIOGRAM Bilateral 01/06/2013   Procedure: LOWER EXTREMITY ANGIOGRAM;  Surgeon: Conrad Walker, MD;  Location: [facility] CATH LAB;  Service: Cardiovascular;  Laterality: Bilateral;   LOWER EXTREMITY ANGIOGRAM Right 01/13/2013   Procedure: LOWER EXTREMITY ANGIOGRAM;  Surgeon: Conrad Wilton, MD;  Location: [facility] CATH LAB;  Service: Cardiovascular;  Laterality: Right;   SKIN CANCER EXCISION  2017   THROAT SURGERY  1983   polyps   TONSILLECTOMY AND ADENOIDECTOMY  ~ 1965   Social History   Socioeconomic History   Marital status: Single    Spouse name: Not on file   Number of children: 0   Years of education: Not on file   Highest education level: Not on file  Occupational History   Occupation: disabled  Social Designer, fashion/clothing strain: Not on file   Food insecurity  Worry: Not on file    Inability: Not on file   Transportation needs    Medical: No     Non-medical: No  Tobacco Use   Smoking status: Current Every Day Smoker    Packs/day: 1.00    Years: 46.00    Pack years: 46.00    Types: Cigarettes    Start date: 02/04/1967   Smokeless tobacco: Never Used  Substance and Sexual Activity   Alcohol use: No    Alcohol/week: 0.0 standard drinks   Drug use: No   Sexual activity: Not Currently  Lifestyle   Physical activity    Days per week: Not on file    Minutes per session: Not on file   Stress: Not on file  Relationships   Social connections    Talks on phone: Patient refused    Gets together: Patient refused    Attends religious service: Patient refused    Active member of club or organization: Patient refused    Attends meetings of clubs or organizations: Patient refused    Relationship status: Patient refused   Intimate partner violence    Fear of current or ex partner: No    Emotionally abused: No    Physically abused: No    Forced sexual activity: No  Other Topics Concern   Not on file  Social History Narrative   Retired - former Sports coach.   Patient in skilled nursing facility.   Family History  Problem Relation Age of Onset   Cancer Mother        lung cancer   Hypertension Mother    Cancer Sister        patient thinks it was uterine cancer   Hypertension Sister    Heart attack Sister    Stroke Neg Hx    Colon cancer Neg Hx    Esophageal cancer Neg Hx    Inflammatory bowel disease Neg Hx    Pancreatic cancer Neg Hx    Liver disease Neg Hx    Rectal cancer Neg Hx    Stomach cancer Neg Hx    I have reviewed his medical, social, and family history in detail and updated the electronic medical record as necessary.    PHYSICAL EXAMINATION  BP (!) 150/60 (BP Location: Right Arm, Patient Position: Sitting, Cuff Size: Normal)    Pulse 72    Temp 98.4 F (36.9 C)    Ht 5' 8" (1.727 m) Comment: wheelchair-unable to obtain   BMI 26.65 kg/m  Wt Readings from Last 3 Encounters:    07/21/18 175 lb 4.3 oz (79.5 kg)  07/16/18 173 lb 9.6 oz (78.7 kg)  07/10/18 173 lb 11.6 oz (78.8 kg)  GEN: NAD, appears stated age, appears chronically ill but better than I see him in the past and more well spirited PSYCH: Cooperative, without pressured speech, he remembers my background from my first time I met him at his first EGD- Mexican/Lebanese EYE: Conjunctivae pink, sclerae anicteric ENT: MMM CV: RR without R/Gs  RESP: Decreased breath sounds at the bases bilaterally GI: NABS, soft, minimal tenderness to palpation in the midepigastrium and left upper quadrant, without rebound or guarding  SKIN: No jaundice NEURO:  Alert & Oriented x 3, no focal deficits   REVIEW OF DATA  I reviewed the following data at the time of this encounter:  GI Procedures and Studies  Previously reviewed  Laboratory Studies  Reviewed in epic  Imaging Studies  No recent cross-sectional imaging since completion  of his XRT   ASSESSMENT  Leonard [name] is a 68 y.o. male with a pmh significant for CHF, CAD, chronic renal insufficiency, PAD (status post AKA), COPD, diabetes, hypothyroidism, hypertension, MGUS, hyperlipidemia, Gastric cancer (status post XRT as currently deemed a non-chemo or surgical candidate).  The patient is seen today for evaluation and management of:  1. Gastric adenocarcinoma ([place])   2. Anemia due to chronic blood loss   3. Anemia due to stage 4 chronic kidney disease ([place])    The patient is hemodynamically stable.  He has been in a very difficult place over the course the last few months.  Deemed by our surgical colleagues to not be a surgical candidate and a non-chemotherapy candidate based on his chronic renal insufficiency a possibility of causing or developing end-stage renal disease has made his overall outlook on life difficult.  He is a full code and wants to do as much as possible as he wants to live he does not want to die.  He is not discussed or seeing his oncologist in a  few months and would be happy to talk with her once again.  He also wants an opportunity to talk with our surgical colleagues as well to see now that he is continuing to have presumed bleeding whether he could be a surgical candidate.  I have been in discussions with our oncology/surgical oncology/radiation oncologist about our patient offline, and we will plan to proceed with an endoscopic evaluation of his previously noted cancer to try to get a better sense of things at this point in time status post XRT.  We had wanted to give them as much information as possible as there is no endoscopic therapy for patient to have bleeding from their cancers that we have available.  Patient is willing to accept that if there is nothing that can be done then he will work with his oncologist and continue to get periodic blood transfusion if that is what it means but he would like an opportunity to try and have a more salient or better portion of life.  If the patient can tolerate a preparation we will plan to proceed with a colonoscopy as well to exonerate any other source for potential GI blood loss.  Agree with previous discussion to see if he would be a candidate for therapies from a chronic renal insufficiency perspective to try and optimize his blood storage.  IV iron infusions may need to be considered as well.  I will alert the team to our discussions and our plans moving forward and the findings thereafter.  Our radiation oncology team has been monitoring his hemoglobin and helping with blood transfusions periodically which will be continued for now.  The risks and benefits of endoscopic evaluation were discussed with the patient; these include but are not limited to the risk of perforation, infection, bleeding, missed lesions, lack of diagnosis, severe illness requiring hospitalization, as well as anesthesia and sedation related illnesses.  The patient is agreeable to proceed.    PLAN  Proceed with scheduling  EGD/colonoscopy in the hospital Periodic blood transfusions as per oncology/radiation oncology Patient would like to discuss with his oncologist and surgical oncology whether he could be a candidate for surgery now, presumably will need repeat imaging after our endoscopies   Orders Placed This Encounter  Procedures   Ambulatory referral to Gastroenterology    New Prescriptions   No medications on file   Modified Medications   No medications on file  Planned Follow Up No follow-ups on file.   [place], MD [facility] Advanced Endoscopy Office # [phone]

## 2018-09-16 NOTE — Patient Instructions (Signed)
If you are age 67 or older, your body mass index should be between 23-30. Your Body mass index is 26.65 kg/m. If this is out of the aforementioned range listed, please consider follow up with your Primary Care Provider.  If you are age 11 or younger, your body mass index should be between 19-25. Your Body mass index is 26.65 kg/m. If this is out of the aformentioned range listed, please consider follow up with your Primary Care Provider.   You have been scheduled for an endoscopy and colonoscopy. Please follow the written instructions given to you at your visit today. Please pick up your prep supplies at the pharmacy within the next 1-3 days. If you use inhalers (even only as needed), please bring them with you on the day of your procedure.  Colon/Endo scheduled at [facility] 10/06/2018 @ 8:30am. Time of arrival is 7:00am  Thank you for choosing me and [facility].  Dr. Rush Landmark

## 2018-09-18 ENCOUNTER — Encounter: Payer: Self-pay | Admitting: Gastroenterology

## 2018-09-18 DIAGNOSIS — C169 Malignant neoplasm of stomach, unspecified: Secondary | ICD-10-CM | POA: Insufficient documentation

## 2018-09-20 DIAGNOSIS — D649 Anemia, unspecified: Secondary | ICD-10-CM | POA: Diagnosis not present

## 2018-09-20 DIAGNOSIS — D464 Refractory anemia, unspecified: Secondary | ICD-10-CM | POA: Diagnosis not present

## 2018-09-20 DIAGNOSIS — N184 Chronic kidney disease, stage 4 (severe): Secondary | ICD-10-CM | POA: Diagnosis not present

## 2018-09-20 DIAGNOSIS — D002 Carcinoma in situ of stomach: Secondary | ICD-10-CM | POA: Diagnosis not present

## 2018-09-20 DIAGNOSIS — D500 Iron deficiency anemia secondary to blood loss (chronic): Secondary | ICD-10-CM | POA: Diagnosis not present

## 2018-09-20 DIAGNOSIS — C801 Malignant (primary) neoplasm, unspecified: Secondary | ICD-10-CM | POA: Diagnosis not present

## 2018-09-22 DIAGNOSIS — I739 Peripheral vascular disease, unspecified: Secondary | ICD-10-CM | POA: Diagnosis not present

## 2018-09-22 DIAGNOSIS — D002 Carcinoma in situ of stomach: Secondary | ICD-10-CM | POA: Diagnosis not present

## 2018-09-22 DIAGNOSIS — C801 Malignant (primary) neoplasm, unspecified: Secondary | ICD-10-CM | POA: Diagnosis not present

## 2018-09-22 DIAGNOSIS — J449 Chronic obstructive pulmonary disease, unspecified: Secondary | ICD-10-CM | POA: Diagnosis not present

## 2018-09-24 DIAGNOSIS — D500 Iron deficiency anemia secondary to blood loss (chronic): Secondary | ICD-10-CM | POA: Diagnosis not present

## 2018-09-24 DIAGNOSIS — F0150 Vascular dementia without behavioral disturbance: Secondary | ICD-10-CM | POA: Diagnosis not present

## 2018-09-24 DIAGNOSIS — C801 Malignant (primary) neoplasm, unspecified: Secondary | ICD-10-CM | POA: Diagnosis not present

## 2018-09-24 DIAGNOSIS — Z20828 Contact with and (suspected) exposure to other viral communicable diseases: Secondary | ICD-10-CM | POA: Diagnosis not present

## 2018-09-24 DIAGNOSIS — D002 Carcinoma in situ of stomach: Secondary | ICD-10-CM | POA: Diagnosis not present

## 2018-09-27 ENCOUNTER — Other Ambulatory Visit: Payer: Self-pay | Admitting: Radiation Oncology

## 2018-09-27 DIAGNOSIS — F0150 Vascular dementia without behavioral disturbance: Secondary | ICD-10-CM | POA: Diagnosis not present

## 2018-09-27 DIAGNOSIS — Q845 Enlarged and hypertrophic nails: Secondary | ICD-10-CM | POA: Diagnosis not present

## 2018-09-27 DIAGNOSIS — C801 Malignant (primary) neoplasm, unspecified: Secondary | ICD-10-CM | POA: Diagnosis not present

## 2018-09-27 DIAGNOSIS — B351 Tinea unguium: Secondary | ICD-10-CM | POA: Diagnosis not present

## 2018-09-27 DIAGNOSIS — D464 Refractory anemia, unspecified: Secondary | ICD-10-CM | POA: Diagnosis not present

## 2018-09-27 DIAGNOSIS — E119 Type 2 diabetes mellitus without complications: Secondary | ICD-10-CM | POA: Diagnosis not present

## 2018-09-27 DIAGNOSIS — I739 Peripheral vascular disease, unspecified: Secondary | ICD-10-CM | POA: Diagnosis not present

## 2018-09-27 DIAGNOSIS — D649 Anemia, unspecified: Secondary | ICD-10-CM | POA: Diagnosis not present

## 2018-09-27 DIAGNOSIS — L603 Nail dystrophy: Secondary | ICD-10-CM | POA: Diagnosis not present

## 2018-09-27 DIAGNOSIS — D500 Iron deficiency anemia secondary to blood loss (chronic): Secondary | ICD-10-CM | POA: Diagnosis not present

## 2018-09-27 DIAGNOSIS — C163 Malignant neoplasm of pyloric antrum: Secondary | ICD-10-CM

## 2018-09-27 DIAGNOSIS — D002 Carcinoma in situ of stomach: Secondary | ICD-10-CM | POA: Diagnosis not present

## 2018-09-27 MED ORDER — SODIUM CHLORIDE 0.9% FLUSH: 3.0000 mL | INTRAVENOUS | Status: DC | PRN

## 2018-09-27 MED ORDER — SODIUM CHLORIDE 0.9% FLUSH: 10.0000 mL | INTRAVENOUS | Status: DC | PRN

## 2018-09-27 MED ORDER — HEPARIN SOD (PORK) LOCK FLUSH 100 UNIT/ML IV SOLN: 250.0000 [IU] | INTRAVENOUS | Status: DC | PRN

## 2018-09-27 MED ORDER — ACETAMINOPHEN 325 MG PO TABS
650.0000 mg | ORAL_TABLET | Freq: Once | ORAL | Status: DC
  Filled 2018-09-27: qty 2

## 2018-09-27 MED ORDER — SODIUM CHLORIDE 0.9% IV SOLUTION
250.0000 mL | Freq: Once | INTRAVENOUS | Status: DC
  Filled 2018-09-27: qty 250

## 2018-09-27 MED ORDER — HEPARIN SOD (PORK) LOCK FLUSH 100 UNIT/ML IV SOLN: 500.0000 [IU] | Freq: Every day | INTRAVENOUS | Status: DC | PRN

## 2018-09-27 MED ORDER — DIPHENHYDRAMINE HCL 25 MG PO CAPS
25.0000 mg | ORAL_CAPSULE | Freq: Once | ORAL | Status: DC
  Filled 2018-09-27: qty 1

## 2018-09-28 ENCOUNTER — Other Ambulatory Visit: Payer: Self-pay | Admitting: Radiation Oncology

## 2018-09-28 ENCOUNTER — Telehealth: Payer: Self-pay

## 2018-09-28 ENCOUNTER — Other Ambulatory Visit: Payer: Self-pay

## 2018-09-28 ENCOUNTER — Inpatient Hospital Stay: Payer: Medicare Other

## 2018-09-28 DIAGNOSIS — C163 Malignant neoplasm of pyloric antrum: Secondary | ICD-10-CM | POA: Diagnosis not present

## 2018-09-28 DIAGNOSIS — Z79899 Other long term (current) drug therapy: Secondary | ICD-10-CM | POA: Diagnosis not present

## 2018-09-28 LAB — SAMPLE TO BLOOD BANK

## 2018-09-28 NOTE — Telephone Encounter (Signed)
The pt appt needs to be rescheduled due to the COVID testing process.  I have seen where the pt is at [facility] in [place].  I tried to call the facility and had to leave a message.  The case is still in the depot waiting to be rescheduled.

## 2018-09-29 ENCOUNTER — Other Ambulatory Visit: Payer: Self-pay

## 2018-09-29 ENCOUNTER — Other Ambulatory Visit: Payer: Self-pay | Admitting: Radiation Oncology

## 2018-09-29 ENCOUNTER — Inpatient Hospital Stay: Payer: Medicare Other

## 2018-09-29 VITALS — BP 156/57 | HR 86 | Temp 98.5°F | Resp 18

## 2018-09-29 DIAGNOSIS — C163 Malignant neoplasm of pyloric antrum: Secondary | ICD-10-CM | POA: Diagnosis not present

## 2018-09-29 DIAGNOSIS — K922 Gastrointestinal hemorrhage, unspecified: Secondary | ICD-10-CM

## 2018-09-29 DIAGNOSIS — Z79899 Other long term (current) drug therapy: Secondary | ICD-10-CM | POA: Diagnosis not present

## 2018-09-29 DIAGNOSIS — D631 Anemia in chronic kidney disease: Secondary | ICD-10-CM

## 2018-09-29 LAB — PREPARE RBC (CROSSMATCH)

## 2018-09-29 MED ORDER — DIPHENHYDRAMINE HCL 25 MG PO CAPS
25.0000 mg | ORAL_CAPSULE | Freq: Once | ORAL | Status: DC
  Filled 2018-09-29: qty 1

## 2018-09-29 MED ORDER — ACETAMINOPHEN 325 MG PO TABS
ORAL_TABLET | ORAL | Status: AC
  Filled 2018-09-29: qty 2

## 2018-09-29 MED ORDER — SODIUM CHLORIDE 0.9% FLUSH: 3.0000 mL | INTRAVENOUS | Status: DC | PRN

## 2018-09-29 MED ORDER — SODIUM CHLORIDE 0.9% IV SOLUTION
250.0000 mL | Freq: Once | INTRAVENOUS | Status: AC
  Administered 2018-09-29: 09:00:00 250 mL via INTRAVENOUS
  Filled 2018-09-29: qty 250

## 2018-09-29 MED ORDER — SODIUM CHLORIDE 0.9% FLUSH
3.0000 mL | INTRAVENOUS | Status: DC | PRN
  Filled 2018-09-29: qty 10

## 2018-09-29 MED ORDER — DIPHENHYDRAMINE HCL 25 MG PO CAPS
ORAL_CAPSULE | ORAL | Status: AC
  Filled 2018-09-29: qty 1

## 2018-09-29 MED ORDER — HEPARIN SOD (PORK) LOCK FLUSH 100 UNIT/ML IV SOLN: 250.0000 [IU] | INTRAVENOUS | Status: DC | PRN

## 2018-09-29 MED ORDER — ACETAMINOPHEN 325 MG PO TABS
650.0000 mg | ORAL_TABLET | Freq: Once | ORAL | Status: DC
  Filled 2018-09-29: qty 2

## 2018-09-29 MED ORDER — SODIUM CHLORIDE 0.9% IV SOLUTION
250.0000 mL | Freq: Once | INTRAVENOUS | Status: DC
  Filled 2018-09-29: qty 250

## 2018-09-29 MED ORDER — SODIUM CHLORIDE 0.9% FLUSH: 10.0000 mL | INTRAVENOUS | Status: DC | PRN

## 2018-09-29 MED ORDER — HEPARIN SOD (PORK) LOCK FLUSH 100 UNIT/ML IV SOLN: 500.0000 [IU] | Freq: Every day | INTRAVENOUS | Status: DC | PRN

## 2018-09-29 MED ORDER — DIPHENHYDRAMINE HCL 25 MG PO TABS
25.0000 mg | ORAL_TABLET | Freq: Once | ORAL | Status: AC
  Administered 2018-09-29: 25 mg via ORAL
  Filled 2018-09-29: qty 1

## 2018-09-29 MED ORDER — ACETAMINOPHEN 325 MG PO TABS
650.0000 mg | ORAL_TABLET | Freq: Once | ORAL | Status: AC
  Administered 2018-09-29: 650 mg via ORAL

## 2018-09-29 NOTE — Telephone Encounter (Signed)
I spoke with Brittney at [facility] she is the nurse taking care of the pt.  She states she will make a note that the 9/2 date will be cancelled and covid will be rescheduled.  I will fax new info to [phone] [facility]

## 2018-09-29 NOTE — Patient Instructions (Signed)

## 2018-09-30 LAB — BPAM RBC
Blood Product Expiration Date: 202009102359
Blood Product Expiration Date: 202009162359
ISSUE DATE / TIME: 202008260937
ISSUE DATE / TIME: 202008260937
Unit Type and Rh: 1700
Unit Type and Rh: 1700

## 2018-09-30 LAB — TYPE AND SCREEN
ABO/RH(D): B NEG
Antibody Screen: NEGATIVE
Unit division: 0
Unit division: 0

## 2018-10-01 ENCOUNTER — Other Ambulatory Visit: Payer: Self-pay

## 2018-10-01 DIAGNOSIS — C169 Malignant neoplasm of stomach, unspecified: Secondary | ICD-10-CM

## 2018-10-01 DIAGNOSIS — D500 Iron deficiency anemia secondary to blood loss (chronic): Secondary | ICD-10-CM

## 2018-10-01 NOTE — Telephone Encounter (Signed)
Colon scheduled for 11/08/18 at [facility] with Dr Jerilynn Mages at 9 am with COVID testing on 9/30.  Information and instructions faxed to [place] at [facility] at [phone]

## 2018-10-02 ENCOUNTER — Other Ambulatory Visit (HOSPITAL_COMMUNITY): Payer: No Typology Code available for payment source

## 2018-10-04 DIAGNOSIS — D002 Carcinoma in situ of stomach: Secondary | ICD-10-CM | POA: Diagnosis not present

## 2018-10-04 DIAGNOSIS — D649 Anemia, unspecified: Secondary | ICD-10-CM | POA: Diagnosis not present

## 2018-10-04 DIAGNOSIS — D464 Refractory anemia, unspecified: Secondary | ICD-10-CM | POA: Diagnosis not present

## 2018-10-04 DIAGNOSIS — D500 Iron deficiency anemia secondary to blood loss (chronic): Secondary | ICD-10-CM | POA: Diagnosis not present

## 2018-10-04 DIAGNOSIS — C801 Malignant (primary) neoplasm, unspecified: Secondary | ICD-10-CM | POA: Diagnosis not present

## 2018-10-06 ENCOUNTER — Telehealth: Payer: Self-pay | Admitting: *Deleted

## 2018-10-06 ENCOUNTER — Encounter (HOSPITAL_COMMUNITY): Admission: RE | Payer: Self-pay | Source: Home / Self Care

## 2018-10-06 ENCOUNTER — Ambulatory Visit (HOSPITAL_COMMUNITY): Admission: RE | Admit: 2018-10-06 | Payer: Medicare Other | Source: Home / Self Care | Admitting: Gastroenterology

## 2018-10-06 SURGERY — COLONOSCOPY WITH PROPOFOL
Anesthesia: Monitor Anesthesia Care

## 2018-10-06 NOTE — Telephone Encounter (Signed)
Spoke with Karie Kirks to let her know that the patient's scheduled procedure today was cancelled because he did not go for his covid test.  I let her know that the procedure has been rescheduled for October 5th and he needs his covid test completed on September 30th.  I informed her that per his most recent labs completed at [facility] his hemoglobin is 9.4.  She verbalized understanding.  Will continue to follow as necessary.  Gloriajean Dell. Leonie Green, BSN

## 2018-10-11 DIAGNOSIS — D649 Anemia, unspecified: Secondary | ICD-10-CM | POA: Diagnosis not present

## 2018-10-11 DIAGNOSIS — D464 Refractory anemia, unspecified: Secondary | ICD-10-CM | POA: Diagnosis not present

## 2018-10-12 ENCOUNTER — Other Ambulatory Visit: Payer: Self-pay | Admitting: Radiation Oncology

## 2018-10-12 DIAGNOSIS — D002 Carcinoma in situ of stomach: Secondary | ICD-10-CM | POA: Diagnosis not present

## 2018-10-12 DIAGNOSIS — C163 Malignant neoplasm of pyloric antrum: Secondary | ICD-10-CM

## 2018-10-12 DIAGNOSIS — D649 Anemia, unspecified: Secondary | ICD-10-CM | POA: Diagnosis not present

## 2018-10-12 DIAGNOSIS — C801 Malignant (primary) neoplasm, unspecified: Secondary | ICD-10-CM | POA: Diagnosis not present

## 2018-10-12 DIAGNOSIS — D500 Iron deficiency anemia secondary to blood loss (chronic): Secondary | ICD-10-CM | POA: Diagnosis not present

## 2018-10-12 MED ORDER — SODIUM CHLORIDE 0.9% IV SOLUTION
250.0000 mL | Freq: Once | INTRAVENOUS | Status: DC
  Filled 2018-10-12: qty 250

## 2018-10-12 MED ORDER — HEPARIN SOD (PORK) LOCK FLUSH 100 UNIT/ML IV SOLN: 250.0000 [IU] | INTRAVENOUS | Status: DC | PRN

## 2018-10-12 MED ORDER — DIPHENHYDRAMINE HCL 25 MG PO CAPS
25.0000 mg | ORAL_CAPSULE | Freq: Once | ORAL | Status: DC
  Filled 2018-10-12: qty 1

## 2018-10-12 MED ORDER — ACETAMINOPHEN 325 MG PO TABS
650.0000 mg | ORAL_TABLET | Freq: Once | ORAL | Status: DC
  Filled 2018-10-12: qty 2

## 2018-10-12 MED ORDER — SODIUM CHLORIDE 0.9% FLUSH: 10.0000 mL | INTRAVENOUS | Status: DC | PRN

## 2018-10-12 MED ORDER — HEPARIN SOD (PORK) LOCK FLUSH 100 UNIT/ML IV SOLN: 500.0000 [IU] | Freq: Every day | INTRAVENOUS | Status: DC | PRN

## 2018-10-12 MED ORDER — SODIUM CHLORIDE 0.9% FLUSH: 3.0000 mL | INTRAVENOUS | Status: DC | PRN

## 2018-10-13 ENCOUNTER — Other Ambulatory Visit: Payer: Self-pay | Admitting: Emergency Medicine

## 2018-10-13 ENCOUNTER — Telehealth: Payer: Self-pay | Admitting: Radiation Oncology

## 2018-10-13 DIAGNOSIS — K922 Gastrointestinal hemorrhage, unspecified: Secondary | ICD-10-CM

## 2018-10-13 DIAGNOSIS — C163 Malignant neoplasm of pyloric antrum: Secondary | ICD-10-CM

## 2018-10-13 DIAGNOSIS — J449 Chronic obstructive pulmonary disease, unspecified: Secondary | ICD-10-CM | POA: Diagnosis not present

## 2018-10-13 DIAGNOSIS — I10 Essential (primary) hypertension: Secondary | ICD-10-CM | POA: Diagnosis not present

## 2018-10-13 DIAGNOSIS — I2510 Atherosclerotic heart disease of native coronary artery without angina pectoris: Secondary | ICD-10-CM | POA: Diagnosis not present

## 2018-10-13 NOTE — Telephone Encounter (Signed)
Called living facility and scheduled appt per 9/8 sch message - spoke with Mardene Celeste and she set up transportation.

## 2018-10-13 NOTE — Progress Notes (Signed)
  Radiation Oncology         [phone] ________________________________  Name: Leonard [name] MRN: [id number]  Date: 08/19/2018  DOB: [birth year]  End of Treatment Note  Diagnosis:   Gastric cancer     Indication for treatment:: Palliative  Radiation treatment dates:   07/08/2018 through 08/19/2018  Site/dose:   The patient was initially treated with a course of radiation treatment corresponding to a 3D conformal treatment.  This was carried out using a 6 field approach to a dose of 45 Gray in 25 fractions.  The patient then received a 9 Gy boost to yield a final dose of 54 Gy.  Narrative: The patient tolerated radiation treatment relatively well.   The patient's blood counts stabilized and trended upward during his course of treatment.  Plan: The patient has completed radiation treatment. The patient will return to radiation oncology clinic for routine followup in one month. I advised the patient to call or return sooner if they have any questions or concerns related to their recovery or treatment. ________________________________  Jodelle Gross, M.D., Ph.D.

## 2018-10-14 ENCOUNTER — Other Ambulatory Visit: Payer: Self-pay | Admitting: Emergency Medicine

## 2018-10-14 ENCOUNTER — Inpatient Hospital Stay: Payer: Medicare Other

## 2018-10-14 ENCOUNTER — Other Ambulatory Visit: Payer: Self-pay

## 2018-10-14 ENCOUNTER — Inpatient Hospital Stay: Payer: Medicare Other | Attending: Radiation Oncology

## 2018-10-14 DIAGNOSIS — K922 Gastrointestinal hemorrhage, unspecified: Secondary | ICD-10-CM

## 2018-10-14 DIAGNOSIS — C163 Malignant neoplasm of pyloric antrum: Secondary | ICD-10-CM | POA: Insufficient documentation

## 2018-10-14 DIAGNOSIS — Z79899 Other long term (current) drug therapy: Secondary | ICD-10-CM | POA: Insufficient documentation

## 2018-10-14 DIAGNOSIS — D649 Anemia, unspecified: Secondary | ICD-10-CM

## 2018-10-14 LAB — CBC WITH DIFFERENTIAL (CANCER CENTER ONLY)
Abs Immature Granulocytes: 0.02 10*3/uL (ref 0.00–0.07)
Basophils Absolute: 0 10*3/uL (ref 0.0–0.1)
Basophils Relative: 0 %
Eosinophils Absolute: 1.5 10*3/uL — ABNORMAL HIGH (ref 0.0–0.5)
Eosinophils Relative: 25 %
HCT: 24.3 % — ABNORMAL LOW (ref 39.0–52.0)
Hemoglobin: 7.7 g/dL — ABNORMAL LOW (ref 13.0–17.0)
Immature Granulocytes: 0 %
Lymphocytes Relative: 9 %
Lymphs Abs: 0.5 10*3/uL — ABNORMAL LOW (ref 0.7–4.0)
MCH: 28.7 pg (ref 26.0–34.0)
MCHC: 31.7 g/dL (ref 30.0–36.0)
MCV: 90.7 fL (ref 80.0–100.0)
Monocytes Absolute: 0.4 10*3/uL (ref 0.1–1.0)
Monocytes Relative: 7 %
Neutro Abs: 3.4 10*3/uL (ref 1.7–7.7)
Neutrophils Relative %: 59 %
Platelet Count: 81 10*3/uL — ABNORMAL LOW (ref 150–400)
RBC: 2.68 MIL/uL — ABNORMAL LOW (ref 4.22–5.81)
RDW: 14.3 % (ref 11.5–15.5)
WBC Count: 5.9 10*3/uL (ref 4.0–10.5)
nRBC: 0 % (ref 0.0–0.2)

## 2018-10-14 LAB — SAMPLE TO BLOOD BANK

## 2018-10-14 LAB — PREPARE RBC (CROSSMATCH)

## 2018-10-14 MED ORDER — ACETAMINOPHEN 325 MG PO TABS
ORAL_TABLET | ORAL | Status: AC
  Filled 2018-10-14: qty 2

## 2018-10-14 MED ORDER — DIPHENHYDRAMINE HCL 25 MG PO CAPS
25.0000 mg | ORAL_CAPSULE | Freq: Once | ORAL | Status: AC
  Administered 2018-10-14: 10:00:00 25 mg via ORAL

## 2018-10-14 MED ORDER — HEPARIN SOD (PORK) LOCK FLUSH 100 UNIT/ML IV SOLN
500.0000 [IU] | Freq: Every day | INTRAVENOUS | Status: DC | PRN
  Filled 2018-10-14: qty 5

## 2018-10-14 MED ORDER — SODIUM CHLORIDE 0.9% FLUSH
10.0000 mL | INTRAVENOUS | Status: DC | PRN
  Filled 2018-10-14: qty 10

## 2018-10-14 MED ORDER — SODIUM CHLORIDE 0.9% IV SOLUTION
250.0000 mL | Freq: Once | INTRAVENOUS | Status: AC
  Administered 2018-10-14: 10:00:00 250 mL via INTRAVENOUS
  Filled 2018-10-14: qty 250

## 2018-10-14 MED ORDER — ACETAMINOPHEN 325 MG PO TABS
650.0000 mg | ORAL_TABLET | Freq: Once | ORAL | Status: AC
  Administered 2018-10-14: 10:00:00 650 mg via ORAL

## 2018-10-14 MED ORDER — DIPHENHYDRAMINE HCL 25 MG PO CAPS
ORAL_CAPSULE | ORAL | Status: AC
  Filled 2018-10-14: qty 1

## 2018-10-14 MED ORDER — HEPARIN SOD (PORK) LOCK FLUSH 100 UNIT/ML IV SOLN
250.0000 [IU] | INTRAVENOUS | Status: DC | PRN
  Filled 2018-10-14: qty 5

## 2018-10-14 NOTE — Patient Instructions (Signed)
Blood Transfusion, Adult, Care After This sheet gives you information about how to care for yourself after your procedure. Your doctor may also give you more specific instructions. If you have problems or questions, contact your doctor. Follow these instructions at home:   Take over-the-counter and prescription medicines only as told by your doctor.  Go back to your normal activities as told by your doctor.  Follow instructions from your doctor about how to take care of the area where an IV tube was put into your vein (insertion site). Make sure you: ? Wash your hands with soap and water before you change your bandage (dressing). If there is no soap and water, use hand sanitizer. ? Change your bandage as told by your doctor.  Check your IV insertion site every day for signs of infection. Check for: ? More redness, swelling, or pain. ? More fluid or blood. ? Warmth. ? Pus or a bad smell. Contact a doctor if:  You have more redness, swelling, or pain around the IV insertion site.  You have more fluid or blood coming from the IV insertion site.  Your IV insertion site feels warm to the touch.  You have pus or a bad smell coming from the IV insertion site.  Your pee (urine) turns pink, red, or brown.  You feel weak after doing your normal activities. Get help right away if:  You have signs of a serious allergic or body defense (immune) system reaction, including: ? Itchiness. ? Hives. ? Trouble breathing. ? Anxiety. ? Pain in your chest or lower back. ? Fever, flushing, and chills. ? Fast pulse. ? Rash. ? Watery poop (diarrhea). ? Throwing up (vomiting). ? Dark pee. ? Serious headache. ? Dizziness. ? Stiff neck. ? Yellow color in your face or the white parts of your eyes (jaundice). Summary  After a blood transfusion, return to your normal activities as told by your doctor.  Every day, check for signs of infection where the IV tube was put into your vein.  Some  signs of infection are warm skin, more redness and pain, more fluid or blood, and pus or a bad smell where the needle went in.  Contact your doctor if you feel weak or have any unusual symptoms. This information is not intended to replace advice given to you by your health care provider. Make sure you discuss any questions you have with your health care provider. Document Released: 02/10/2014 Document Revised: 05/27/2017 Document Reviewed: 09/14/2015 Elsevier Patient Education  2020 [employer]  

## 2018-10-15 LAB — BPAM RBC
Blood Product Expiration Date: 202010082359
Blood Product Expiration Date: 202010082359
ISSUE DATE / TIME: 202009101038
ISSUE DATE / TIME: 202009101038
Unit Type and Rh: 1700
Unit Type and Rh: 1700

## 2018-10-15 LAB — TYPE AND SCREEN
ABO/RH(D): B NEG
Antibody Screen: NEGATIVE
Unit division: 0
Unit division: 0

## 2018-10-18 DIAGNOSIS — D500 Iron deficiency anemia secondary to blood loss (chronic): Secondary | ICD-10-CM | POA: Diagnosis not present

## 2018-10-18 DIAGNOSIS — C801 Malignant (primary) neoplasm, unspecified: Secondary | ICD-10-CM | POA: Diagnosis not present

## 2018-10-18 DIAGNOSIS — D002 Carcinoma in situ of stomach: Secondary | ICD-10-CM | POA: Diagnosis not present

## 2018-10-18 DIAGNOSIS — D649 Anemia, unspecified: Secondary | ICD-10-CM | POA: Diagnosis not present

## 2018-10-18 DIAGNOSIS — D464 Refractory anemia, unspecified: Secondary | ICD-10-CM | POA: Diagnosis not present

## 2018-10-19 ENCOUNTER — Inpatient Hospital Stay (HOSPITAL_COMMUNITY)
Admission: RE | Admit: 2018-10-19 | Discharge: 2018-10-19 | Disposition: A | Discharge status: Home / Self Care | Payer: Medicare Other | Source: Ambulatory Visit | Attending: Nephrology | Admitting: Nephrology

## 2018-10-19 ENCOUNTER — Encounter (HOSPITAL_COMMUNITY): Payer: Self-pay

## 2018-10-25 DIAGNOSIS — D464 Refractory anemia, unspecified: Secondary | ICD-10-CM | POA: Diagnosis not present

## 2018-10-25 DIAGNOSIS — D002 Carcinoma in situ of stomach: Secondary | ICD-10-CM | POA: Diagnosis not present

## 2018-10-25 DIAGNOSIS — C801 Malignant (primary) neoplasm, unspecified: Secondary | ICD-10-CM | POA: Diagnosis not present

## 2018-10-25 DIAGNOSIS — D500 Iron deficiency anemia secondary to blood loss (chronic): Secondary | ICD-10-CM | POA: Diagnosis not present

## 2018-10-25 DIAGNOSIS — D649 Anemia, unspecified: Secondary | ICD-10-CM | POA: Diagnosis not present

## 2018-11-01 ENCOUNTER — Encounter (HOSPITAL_COMMUNITY): Payer: Self-pay

## 2018-11-01 ENCOUNTER — Inpatient Hospital Stay (HOSPITAL_COMMUNITY)
Admission: EM | Admit: 2018-11-01 | Discharge: 2018-11-07 | DRG: 375 | Disposition: A | Discharge status: Skilled Nursing Facility | Payer: Medicare Other | Source: Skilled Nursing Facility | Attending: Student | Admitting: Student

## 2018-11-01 DIAGNOSIS — E118 Type 2 diabetes mellitus with unspecified complications: Secondary | ICD-10-CM | POA: Diagnosis not present

## 2018-11-01 DIAGNOSIS — Z801 Family history of malignant neoplasm of trachea, bronchus and lung: Secondary | ICD-10-CM

## 2018-11-01 DIAGNOSIS — M1A0720 Idiopathic chronic gout, left ankle and foot, without tophus (tophi): Secondary | ICD-10-CM | POA: Diagnosis present

## 2018-11-01 DIAGNOSIS — F17210 Nicotine dependence, cigarettes, uncomplicated: Secondary | ICD-10-CM | POA: Diagnosis present

## 2018-11-01 DIAGNOSIS — J9611 Chronic respiratory failure with hypoxia: Secondary | ICD-10-CM | POA: Diagnosis present

## 2018-11-01 DIAGNOSIS — D62 Acute posthemorrhagic anemia: Secondary | ICD-10-CM | POA: Diagnosis present

## 2018-11-01 DIAGNOSIS — Z03818 Encounter for observation for suspected exposure to other biological agents ruled out: Secondary | ICD-10-CM | POA: Diagnosis not present

## 2018-11-01 DIAGNOSIS — Z85828 Personal history of other malignant neoplasm of skin: Secondary | ICD-10-CM

## 2018-11-01 DIAGNOSIS — I130 Hypertensive heart and chronic kidney disease with heart failure and stage 1 through stage 4 chronic kidney disease, or unspecified chronic kidney disease: Secondary | ICD-10-CM | POA: Diagnosis present

## 2018-11-01 DIAGNOSIS — N184 Chronic kidney disease, stage 4 (severe): Secondary | ICD-10-CM | POA: Diagnosis present

## 2018-11-01 DIAGNOSIS — D509 Iron deficiency anemia, unspecified: Secondary | ICD-10-CM | POA: Diagnosis not present

## 2018-11-01 DIAGNOSIS — Z89612 Acquired absence of left leg above knee: Secondary | ICD-10-CM

## 2018-11-01 DIAGNOSIS — E538 Deficiency of other specified B group vitamins: Secondary | ICD-10-CM | POA: Diagnosis present

## 2018-11-01 DIAGNOSIS — N179 Acute kidney failure, unspecified: Secondary | ICD-10-CM

## 2018-11-01 DIAGNOSIS — Z20828 Contact with and (suspected) exposure to other viral communicable diseases: Secondary | ICD-10-CM | POA: Diagnosis present

## 2018-11-01 DIAGNOSIS — E1122 Type 2 diabetes mellitus with diabetic chronic kidney disease: Secondary | ICD-10-CM | POA: Diagnosis present

## 2018-11-01 DIAGNOSIS — D464 Refractory anemia, unspecified: Secondary | ICD-10-CM | POA: Diagnosis not present

## 2018-11-01 DIAGNOSIS — D002 Carcinoma in situ of stomach: Secondary | ICD-10-CM | POA: Diagnosis not present

## 2018-11-01 DIAGNOSIS — K922 Gastrointestinal hemorrhage, unspecified: Secondary | ICD-10-CM | POA: Diagnosis present

## 2018-11-01 DIAGNOSIS — R7889 Finding of other specified substances, not normally found in blood: Secondary | ICD-10-CM | POA: Diagnosis not present

## 2018-11-01 DIAGNOSIS — I2510 Atherosclerotic heart disease of native coronary artery without angina pectoris: Secondary | ICD-10-CM | POA: Diagnosis present

## 2018-11-01 DIAGNOSIS — E11319 Type 2 diabetes mellitus with unspecified diabetic retinopathy without macular edema: Secondary | ICD-10-CM | POA: Diagnosis present

## 2018-11-01 DIAGNOSIS — Z86718 Personal history of other venous thrombosis and embolism: Secondary | ICD-10-CM

## 2018-11-01 DIAGNOSIS — Z794 Long term (current) use of insulin: Secondary | ICD-10-CM

## 2018-11-01 DIAGNOSIS — C163 Malignant neoplasm of pyloric antrum: Secondary | ICD-10-CM | POA: Diagnosis not present

## 2018-11-01 DIAGNOSIS — E1151 Type 2 diabetes mellitus with diabetic peripheral angiopathy without gangrene: Secondary | ICD-10-CM | POA: Diagnosis present

## 2018-11-01 DIAGNOSIS — D696 Thrombocytopenia, unspecified: Secondary | ICD-10-CM | POA: Diagnosis present

## 2018-11-01 DIAGNOSIS — D500 Iron deficiency anemia secondary to blood loss (chronic): Secondary | ICD-10-CM | POA: Diagnosis not present

## 2018-11-01 DIAGNOSIS — N400 Enlarged prostate without lower urinary tract symptoms: Secondary | ICD-10-CM | POA: Diagnosis present

## 2018-11-01 DIAGNOSIS — Z7901 Long term (current) use of anticoagulants: Secondary | ICD-10-CM

## 2018-11-01 DIAGNOSIS — J449 Chronic obstructive pulmonary disease, unspecified: Secondary | ICD-10-CM | POA: Diagnosis present

## 2018-11-01 DIAGNOSIS — E872 Acidosis: Secondary | ICD-10-CM | POA: Diagnosis present

## 2018-11-01 DIAGNOSIS — Z993 Dependence on wheelchair: Secondary | ICD-10-CM

## 2018-11-01 DIAGNOSIS — E876 Hypokalemia: Secondary | ICD-10-CM | POA: Diagnosis present

## 2018-11-01 DIAGNOSIS — C801 Malignant (primary) neoplasm, unspecified: Secondary | ICD-10-CM | POA: Diagnosis not present

## 2018-11-01 DIAGNOSIS — Z79899 Other long term (current) drug therapy: Secondary | ICD-10-CM

## 2018-11-01 DIAGNOSIS — Z9889 Other specified postprocedural states: Secondary | ICD-10-CM

## 2018-11-01 DIAGNOSIS — I5032 Chronic diastolic (congestive) heart failure: Secondary | ICD-10-CM | POA: Diagnosis present

## 2018-11-01 DIAGNOSIS — J90 Pleural effusion, not elsewhere classified: Secondary | ICD-10-CM | POA: Diagnosis not present

## 2018-11-01 DIAGNOSIS — Z79890 Hormone replacement therapy (postmenopausal): Secondary | ICD-10-CM

## 2018-11-01 DIAGNOSIS — D649 Anemia, unspecified: Secondary | ICD-10-CM | POA: Diagnosis not present

## 2018-11-01 DIAGNOSIS — T502X5A Adverse effect of carbonic-anhydrase inhibitors, benzothiadiazides and other diuretics, initial encounter: Secondary | ICD-10-CM | POA: Diagnosis present

## 2018-11-01 DIAGNOSIS — K921 Melena: Secondary | ICD-10-CM | POA: Diagnosis present

## 2018-11-01 DIAGNOSIS — D472 Monoclonal gammopathy: Secondary | ICD-10-CM | POA: Diagnosis present

## 2018-11-01 DIAGNOSIS — Z923 Personal history of irradiation: Secondary | ICD-10-CM

## 2018-11-01 DIAGNOSIS — D630 Anemia in neoplastic disease: Secondary | ICD-10-CM | POA: Diagnosis present

## 2018-11-01 DIAGNOSIS — I159 Secondary hypertension, unspecified: Secondary | ICD-10-CM | POA: Diagnosis present

## 2018-11-01 DIAGNOSIS — E782 Mixed hyperlipidemia: Secondary | ICD-10-CM | POA: Diagnosis not present

## 2018-11-01 DIAGNOSIS — K219 Gastro-esophageal reflux disease without esophagitis: Secondary | ICD-10-CM | POA: Diagnosis present

## 2018-11-01 DIAGNOSIS — H9190 Unspecified hearing loss, unspecified ear: Secondary | ICD-10-CM | POA: Diagnosis present

## 2018-11-01 DIAGNOSIS — E1142 Type 2 diabetes mellitus with diabetic polyneuropathy: Secondary | ICD-10-CM | POA: Diagnosis present

## 2018-11-01 DIAGNOSIS — R531 Weakness: Secondary | ICD-10-CM | POA: Diagnosis not present

## 2018-11-01 DIAGNOSIS — Z951 Presence of aortocoronary bypass graft: Secondary | ICD-10-CM

## 2018-11-01 DIAGNOSIS — E039 Hypothyroidism, unspecified: Secondary | ICD-10-CM | POA: Diagnosis present

## 2018-11-01 DIAGNOSIS — E1165 Type 2 diabetes mellitus with hyperglycemia: Secondary | ICD-10-CM | POA: Diagnosis not present

## 2018-11-01 LAB — CBC WITH DIFFERENTIAL/PLATELET
Abs Immature Granulocytes: 0.03 10*3/uL (ref 0.00–0.07)
Basophils Absolute: 0 10*3/uL (ref 0.0–0.1)
Basophils Relative: 1 %
Eosinophils Absolute: 0.5 10*3/uL (ref 0.0–0.5)
Eosinophils Relative: 12 %
HCT: 14.8 % — ABNORMAL LOW (ref 39.0–52.0)
Hemoglobin: 4.5 g/dL — CL (ref 13.0–17.0)
Immature Granulocytes: 1 %
Lymphocytes Relative: 13 %
Lymphs Abs: 0.5 10*3/uL — ABNORMAL LOW (ref 0.7–4.0)
MCH: 30 pg (ref 26.0–34.0)
MCHC: 30.4 g/dL (ref 30.0–36.0)
MCV: 98.7 fL (ref 80.0–100.0)
Monocytes Absolute: 0.4 10*3/uL (ref 0.1–1.0)
Monocytes Relative: 9 %
Neutro Abs: 2.6 10*3/uL (ref 1.7–7.7)
Neutrophils Relative %: 64 %
Platelets: 87 10*3/uL — ABNORMAL LOW (ref 150–400)
RBC: 1.5 MIL/uL — ABNORMAL LOW (ref 4.22–5.81)
RDW: 17 % — ABNORMAL HIGH (ref 11.5–15.5)
WBC: 4.1 10*3/uL (ref 4.0–10.5)
nRBC: 0 % (ref 0.0–0.2)

## 2018-11-01 LAB — GLUCOSE, CAPILLARY
Glucose-Capillary: 192 mg/dL — ABNORMAL HIGH (ref 70–99)
Glucose-Capillary: 220 mg/dL — ABNORMAL HIGH (ref 70–99)

## 2018-11-01 LAB — BASIC METABOLIC PANEL
Anion gap: 12 (ref 5–15)
BUN: 49 mg/dL — ABNORMAL HIGH (ref 8–23)
CO2: 19 mmol/L — ABNORMAL LOW (ref 22–32)
Calcium: 8.1 mg/dL — ABNORMAL LOW (ref 8.9–10.3)
Chloride: 112 mmol/L — ABNORMAL HIGH (ref 98–111)
Creatinine, Ser: 2.85 mg/dL — ABNORMAL HIGH (ref 0.61–1.24)
GFR calc Af Amer: 25 mL/min — ABNORMAL LOW (ref >60)
GFR calc non Af Amer: 22 mL/min — ABNORMAL LOW (ref >60)
Glucose, Bld: 242 mg/dL — ABNORMAL HIGH (ref 70–99)
Potassium: 3.2 mmol/L — ABNORMAL LOW (ref 3.5–5.1)
Sodium: 143 mmol/L (ref 135–145)

## 2018-11-01 LAB — PREPARE RBC (CROSSMATCH)

## 2018-11-01 MED ORDER — CHLORHEXIDINE GLUCONATE CLOTH 2 % EX PADS
6.0000 | MEDICATED_PAD | Freq: Every day | CUTANEOUS | Status: DC
  Administered 2018-11-01 – 2018-11-04 (×4): 6 via TOPICAL

## 2018-11-01 MED ORDER — FUROSEMIDE 10 MG/ML IJ SOLN: 40.0000 mg | Freq: Once | INTRAMUSCULAR | Status: DC

## 2018-11-01 MED ORDER — SODIUM CHLORIDE 0.9 % IV SOLN
250.0000 mL | INTRAVENOUS | Status: DC | PRN
  Administered 2018-11-02: 13:00:00 via INTRAVENOUS

## 2018-11-01 MED ORDER — FUROSEMIDE 10 MG/ML IJ SOLN
40.0000 mg | Freq: Once | INTRAMUSCULAR | Status: AC
  Administered 2018-11-01: 23:00:00 40 mg via INTRAVENOUS
  Filled 2018-11-01: qty 4

## 2018-11-01 MED ORDER — CLONIDINE HCL 0.2 MG/24HR TD PTWK
0.2000 mg | MEDICATED_PATCH | TRANSDERMAL | Status: DC
  Administered 2018-11-06: 0.2 mg via TRANSDERMAL
  Filled 2018-11-01: qty 1

## 2018-11-01 MED ORDER — SODIUM CHLORIDE 0.9% FLUSH: 3.0000 mL | INTRAVENOUS | Status: DC | PRN

## 2018-11-01 MED ORDER — SODIUM CHLORIDE 0.9 % IV SOLN
8.0000 mg/h | INTRAVENOUS | Status: DC
  Administered 2018-11-01: 8 mg/h via INTRAVENOUS
  Filled 2018-11-01 (×2): qty 80

## 2018-11-01 MED ORDER — TORSEMIDE 20 MG PO TABS
40.0000 mg | ORAL_TABLET | Freq: Every day | ORAL | Status: DC
  Filled 2018-11-01: qty 2

## 2018-11-01 MED ORDER — POLYETHYLENE GLYCOL 3350 17 G PO PACK: 17.0000 g | PACK | Freq: Every day | ORAL | Status: DC | PRN

## 2018-11-01 MED ORDER — FERROUS SULFATE 325 (65 FE) MG PO TABS
325.0000 mg | ORAL_TABLET | Freq: Every day | ORAL | Status: DC
  Administered 2018-11-02 – 2018-11-07 (×6): 325 mg via ORAL
  Filled 2018-11-01 (×6): qty 1

## 2018-11-01 MED ORDER — AMLODIPINE BESYLATE 5 MG PO TABS
5.0000 mg | ORAL_TABLET | Freq: Every day | ORAL | Status: DC
  Administered 2018-11-02 – 2018-11-07 (×6): 5 mg via ORAL
  Filled 2018-11-01 (×6): qty 1

## 2018-11-01 MED ORDER — ENSURE ENLIVE PO LIQD: 237.0000 mL | Freq: Three times a day (TID) | ORAL | Status: DC

## 2018-11-01 MED ORDER — POTASSIUM CHLORIDE CRYS ER 20 MEQ PO TBCR
30.0000 meq | EXTENDED_RELEASE_TABLET | Freq: Once | ORAL | Status: AC
  Administered 2018-11-01: 30 meq via ORAL
  Filled 2018-11-01: qty 1

## 2018-11-01 MED ORDER — HYDRALAZINE HCL 50 MG PO TABS
100.0000 mg | ORAL_TABLET | Freq: Three times a day (TID) | ORAL | Status: DC
  Administered 2018-11-01 – 2018-11-07 (×14): 100 mg via ORAL
  Filled 2018-11-01 (×14): qty 2

## 2018-11-01 MED ORDER — LEVOTHYROXINE SODIUM 100 MCG PO TABS
100.0000 ug | ORAL_TABLET | Freq: Every day | ORAL | Status: DC
  Administered 2018-11-02 – 2018-11-07 (×6): 100 ug via ORAL
  Filled 2018-11-01 (×6): qty 1

## 2018-11-01 MED ORDER — ISOSORBIDE MONONITRATE ER 60 MG PO TB24
120.0000 mg | ORAL_TABLET | Freq: Every day | ORAL | Status: DC
  Administered 2018-11-02 – 2018-11-07 (×6): 120 mg via ORAL
  Filled 2018-11-01 (×6): qty 2

## 2018-11-01 MED ORDER — ATORVASTATIN CALCIUM 40 MG PO TABS
40.0000 mg | ORAL_TABLET | Freq: Every day | ORAL | Status: DC
  Administered 2018-11-02 – 2018-11-06 (×5): 40 mg via ORAL
  Filled 2018-11-01 (×5): qty 1

## 2018-11-01 MED ORDER — DOXAZOSIN MESYLATE 8 MG PO TABS
8.0000 mg | ORAL_TABLET | Freq: Every day | ORAL | Status: DC
  Administered 2018-11-01 – 2018-11-07 (×7): 8 mg via ORAL
  Filled 2018-11-01 (×4): qty 1
  Filled 2018-11-01: qty 8
  Filled 2018-11-01 (×2): qty 1
  Filled 2018-11-01 (×2): qty 4

## 2018-11-01 MED ORDER — ACETAMINOPHEN 650 MG RE SUPP: 650.0000 mg | Freq: Four times a day (QID) | RECTAL | Status: DC | PRN

## 2018-11-01 MED ORDER — ACETAMINOPHEN 325 MG PO TABS
650.0000 mg | ORAL_TABLET | Freq: Four times a day (QID) | ORAL | Status: DC | PRN
  Administered 2018-11-02: 650 mg via ORAL
  Filled 2018-11-01: qty 2

## 2018-11-01 MED ORDER — SODIUM CHLORIDE 0.9 % IV SOLN
80.0000 mg | Freq: Once | INTRAVENOUS | Status: AC
  Administered 2018-11-01: 80 mg via INTRAVENOUS
  Filled 2018-11-01: qty 80

## 2018-11-01 MED ORDER — SODIUM CHLORIDE 0.9% IV SOLUTION: Freq: Once | INTRAVENOUS | Status: DC

## 2018-11-01 MED ORDER — INSULIN ASPART 100 UNIT/ML ~~LOC~~ SOLN
0.0000 [IU] | SUBCUTANEOUS | Status: DC
  Administered 2018-11-01: 2 [IU] via SUBCUTANEOUS
  Administered 2018-11-02 (×2): 1 [IU] via SUBCUTANEOUS
  Administered 2018-11-02: 2 [IU] via SUBCUTANEOUS
  Administered 2018-11-02 (×2): 1 [IU] via SUBCUTANEOUS
  Administered 2018-11-03 (×2): 2 [IU] via SUBCUTANEOUS
  Administered 2018-11-03 (×2): 1 [IU] via SUBCUTANEOUS
  Administered 2018-11-03: 2 [IU] via SUBCUTANEOUS
  Administered 2018-11-04: 22:00:00 1 [IU] via SUBCUTANEOUS
  Administered 2018-11-04: 01:00:00 2 [IU] via SUBCUTANEOUS
  Administered 2018-11-04: 18:00:00 1 [IU] via SUBCUTANEOUS
  Administered 2018-11-04 – 2018-11-05 (×2): 2 [IU] via SUBCUTANEOUS
  Administered 2018-11-05 – 2018-11-06 (×3): 1 [IU] via SUBCUTANEOUS
  Administered 2018-11-06: 17:00:00 2 [IU] via SUBCUTANEOUS
  Administered 2018-11-07: 1 [IU] via SUBCUTANEOUS
  Filled 2018-11-01: qty 0.09

## 2018-11-01 MED ORDER — CARVEDILOL 12.5 MG PO TABS
12.5000 mg | ORAL_TABLET | Freq: Two times a day (BID) | ORAL | Status: DC
  Administered 2018-11-02 – 2018-11-07 (×11): 12.5 mg via ORAL
  Filled 2018-11-01 (×11): qty 1

## 2018-11-01 MED ORDER — ONDANSETRON HCL 4 MG PO TABS: 4.0000 mg | ORAL_TABLET | Freq: Three times a day (TID) | ORAL | Status: DC | PRN

## 2018-11-01 NOTE — ED Provider Notes (Signed)
[facility] DEPT Provider Note   CSN: [id number] Arrival date & time: 11/01/18  1540     History   Chief Complaint No chief complaint on file.   HPI Leonard [name] is a 68 y.o. male.CHF, CAD, chronic renal insufficiency, PAD (status post AKA), COPD, diabetes, hypothyroidism, hypertension, MGUS, hyperlipidemia, Gastric cancer (status post XRT as currently deemed a non-chemo or surgical candidate).    Presents emerged part with report of low hemoglobin.  Patient had routine labs performed today that showed hemoglobin 4.7 at his facility.  Transferred emergency department for further assessment.  Time of my interview patient has no acute complaints, states that he has not noted any changes in his bowels, notably no bright red blood per rectum, no dark tarry stools.      HPI  Past Medical History:  Diagnosis Date  . Acute on chronic diastolic heart failure ([place]) 07/04/2014  . Anemia of renal disease   . Atherosclerotic peripheral vascular disease with gangrene ([place]) 02/14/2017  . CAD (coronary artery disease) 04/06/2013  . Cancer ([place])    skin cancer-  . CHF (congestive heart failure) ([place])   . Chronic anemia 02/28/2016  . Chronic deep vein thrombosis (DVT) of femoral vein of left lower extremity ([place]) 09/03/2016  . Chronic respiratory failure with hypoxia ([place]) 08/25/2017  . CKD (chronic kidney disease) stage 4, GFR 15-29 ml/min (HCC) 01/15/2015   Follows with [facility].  Last visits:  07/23/16: No change in meds. Worried that if we increase diuretics could lead to another AKI  . CKD (chronic kidney disease), stage IV ([place])    07/07/2017  . Claudication ([place])   . Collapse of left talus 01/14/2017  . COPD GOLD III  02/16/2014   Quit smoking 2014  - PFT's  04/06/13   FEV1 1.28 (44 % ) ratio 48  p no % improvement from saba p ?  prior to study with DLCO  43/54 % corrects to 97  % for alv volume - 01/31/2016  After extensive coaching HFA effectiveness =    75%  from baseline 0 > changed to sprivia respimat   - PFT's  03/17/2016  FEV1 2.25  (81 % ) ratio 88  p 2 % improvement from saba p spiriva respimat x 2  prior to study with  . Coronary artery disease   . Diabetes ([place])   . Diabetic polyneuropathy associated with type 2 diabetes mellitus ([place]) 01/14/2017  . Diabetic retinopathy ([place]) 11/13/2015   Per Dr. Erenest Rasher eye exam in 10/17/15, mild background diabetic retinopathy  . Fatigue associated with anemia 01/21/2016  . Gangrene of toe ([place])    right 5th/notes 01/04/2013   . HCAP (healthcare-associated pneumonia)   . Hematuria 06/27/2015  . High cholesterol   . History of tobacco use 08/23/2013   Smoked pack per day for 46 years Quit smoking - 02/03/2013   . Hypertension   . Hypothyroidism 11/22/2014  . Idiopathic chronic gout of left foot without tophus   . Iron deficiency anemia 08/03/2015  . Left foot pain 10/29/2016  . Long term (current) use of anticoagulants [Z79.01] 09/09/2016  . MGUS (monoclonal gammopathy of unknown significance) 01/15/2015  . Mixed hyperlipidemia 07/17/2008  . Morbid obesity due to excess calories ([place]) 03/17/2016  . PAD (peripheral artery disease) ([place])   . Pleural effusion   . Pneumonitis 07/26/2017  . Pulmonary artery hypertension ([place]) 07/25/2017  . PVD - hx of Rt SFA PTA and s/p Rt 4-5th toe amp  Dec 2014 04/05/2013  . 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 Respiratory failure ([place]) 08/13/2017  . Respiratory failure with hypoxia ([place]) 09/23/2017  . 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 Secondary hypertension   . Shortness of breath dyspnea    with exertion  . Skin lesion of face 02/22/2015  . SOB (shortness of breath)   . Type 2 diabetes mellitus with pressure callus ([place]) 12/29/2016  . Type II diabetes mellitus (HCC)    Type II  . Type II diabetes mellitus with complication [facility])     Patient Active Problem List   Diagnosis Date Noted  . 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 Symptomatic anemia 07/20/2018  .  Hematemesis/vomiting blood 07/15/2018  . Acute blood loss anemia 07/15/2018  . Anemia due to chronic blood loss   . Upper GI bleed 06/23/2018  . Acute renal failure superimposed on chronic kidney disease ([place])   . Abdominal pain   . DNR (do not resuscitate)   . 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 DNR (do not resuscitate) discussion   . Anemia 03/29/2018  . Acute GI bleeding 03/28/2018  . Dysphagia 10/01/2017  . Palliative care by specialist   . Severe comorbid illness   . Moderate malnutrition ([place]) 08/27/2017  . Hypoxemia   . Atelectasis   . Chronic respiratory failure with hypoxia ([place]) 08/25/2017  . Pulmonary artery hypertension ([place]) 07/25/2017  . Pressure injury of skin 07/20/2017  . Goals of care, counseling/discussion 02/11/2017  . Diabetic polyneuropathy associated with type 2 diabetes mellitus ([place]) 01/14/2017  . Pleural effusion   . Chronic anemia 02/28/2016  . Weakness generalized 01/21/2016  . Diabetic retinopathy ([place]) 11/13/2015  . Iron deficiency anemia 08/03/2015  . Type II diabetes mellitus with complication ([place])   . Chronic kidney disease (CKD), stage III (moderate) (HCC)   . MGUS (monoclonal gammopathy of unknown significance) 01/15/2015  . Deficiency anemia 01/15/2015  . CKD (chronic kidney disease) stage 4, GFR 15-29 ml/min (HCC) 01/15/2015  . Hypothyroidism 11/22/2014  . COPD GOLD III  02/16/2014  . Anemia of renal disease   . Chronic diastolic CHF (congestive heart failure) ([place])   . History of tobacco use 08/23/2013  . 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 CAD (coronary artery disease) 04/06/2013  . PVD - hx of Rt SFA PTA and s/p Rt 4-5th toe amp Dec 2014 04/05/2013  . 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 Mixed hyperlipidemia 07/17/2008    Past Surgical History:  Procedure Laterality Date  . ABDOMINAL AORTOGRAM W/LOWER EXTREMITY N/A 03/12/2017   Procedure: ABDOMINAL AORTOGRAM W/LOWER EXTREMITY;  Surgeon: Conrad [place], MD;  Location: [facility]  CV LAB;  Service: Cardiovascular;  Laterality: N/A;  . AMPUTATION Right 01/28/2013   Procedure: RAY AMPUTATION RIGHT  4th & 5th TOE;  Surgeon: Rosetta Posner, MD;  Location: [facility];  Service: Vascular;  Laterality: Right;  . AMPUTATION Left 03/15/2017   Procedure: AMPUTATION ABOVE KNEE;  Surgeon: Waynetta Sandy, MD;  Location: [facility];  Service: Vascular;  Laterality: Left;  . ANGIOPLASTY / STENTING FEMORAL Right 01/13/2013   superficial femoral artery x 2 (3 mm x 60 mm, 4 mm x 60 mm)  . AV FISTULA PLACEMENT Left 02/11/2016   Procedure: LEFT UPPER ARM BRACHIAL CEPHALIC ARTERIOVENOUS (AV) FISTULA CREATION;  Surgeon: Conrad [place], MD;  Location: [facility];  Service: Vascular;  Laterality: Left;  . BIOPSY  03/29/2018   Procedure: BIOPSY;  Surgeon: Rush Landmark Telford Nab., MD;  Location: [facility]  ENDOSCOPY;  Service: Gastroenterology;;  . BIOPSY  05/31/2018   Procedure: BIOPSY;  Surgeon: Irving Copas., MD;  Location: [facility];  Service: Gastroenterology;;  . COLONOSCOPY W/ POLYPECTOMY    . CORONARY ARTERY BYPASS GRAFT N/A 04/07/2013   Procedure: CORONARY ARTERY BYPASS GRAFTING (CABG);  Surgeon: Ivin Poot, MD;  Location: [facility];  Service: Open Heart Surgery;  Laterality: N/A;  . ESOPHAGOGASTRODUODENOSCOPY (EGD) WITH PROPOFOL N/A 03/29/2018   Procedure: ESOPHAGOGASTRODUODENOSCOPY (EGD) WITH PROPOFOL;  Surgeon: Rush Landmark Telford Nab., MD;  Location: [facility];  Service: Gastroenterology;  Laterality: N/A;  . ESOPHAGOGASTRODUODENOSCOPY (EGD) WITH PROPOFOL N/A 05/31/2018   Procedure: ESOPHAGOGASTRODUODENOSCOPY (EGD) WITH PROPOFOL;  Surgeon: Rush Landmark Telford Nab., MD;  Location: [facility];  Service: Gastroenterology;  Laterality: N/A;  . EUS N/A 05/31/2018   Procedure: UPPER ENDOSCOPIC ULTRASOUND (EUS) RADIAL;  Surgeon: Rush Landmark Telford Nab., MD;  Location: [facility];  Service: Gastroenterology;  Laterality: N/A;  . INTRAOPERATIVE TRANSESOPHAGEAL ECHOCARDIOGRAM N/A 04/07/2013    Procedure: INTRAOPERATIVE TRANSESOPHAGEAL ECHOCARDIOGRAM;  Surgeon: Ivin Poot, MD;  Location: [facility];  Service: Open Heart Surgery;  Laterality: N/A;  . IR FLUORO GUIDE CV LINE RIGHT  11/04/2017  . IR REMOVAL TUN CV CATH W/O FL  11/27/2017  . IR US GUIDE VASC ACCESS RIGHT  11/04/2017  . LEFT HEART CATHETERIZATION WITH CORONARY ANGIOGRAM N/A 04/05/2013   Procedure: LEFT HEART CATHETERIZATION WITH CORONARY ANGIOGRAM;  Surgeon: Peter M Martinique, MD;  Location: [facility] CATH LAB;  Service: Cardiovascular;  Laterality: N/A;  . LOWER EXTREMITY ANGIOGRAM Bilateral 01/06/2013   Procedure: LOWER EXTREMITY ANGIOGRAM;  Surgeon: Conrad [place], MD;  Location: [facility] CATH LAB;  Service: Cardiovascular;  Laterality: Bilateral;  . LOWER EXTREMITY ANGIOGRAM Right 01/13/2013   Procedure: LOWER EXTREMITY ANGIOGRAM;  Surgeon: Conrad [place], MD;  Location: [facility] CATH LAB;  Service: Cardiovascular;  Laterality: Right;  . SKIN CANCER EXCISION  2017  . THROAT SURGERY  1983   polyps  . TONSILLECTOMY AND ADENOIDECTOMY  ~ 1965        Home Medications    Prior to Admission medications   Medication Sig Start Date End Date Taking? Authorizing Provider  acetaminophen (TYLENOL) 325 MG tablet Take 650 mg by mouth every 6 (six) hours as needed for fever.    [provider]  amLODipine (NORVASC) 5 MG tablet Take 1 tablet (5 mg total) by mouth daily. 07/12/18   Caroline More, DO  ascorbic acid (VITAMIN C) 500 MG tablet Take 500 mg by mouth daily.    [provider]  atorvastatin (LIPITOR) 40 MG tablet TAKE ONE (1) TABLET BY MOUTH EVERY DAY AT 6:00PM Patient taking differently: Take 40 mg by mouth daily at 6 PM.  11/17/16   Larey Dresser, MD  Blood Glucose Monitoring Suppl ([place]) w/Device KIT 1 kit by Does not apply route daily. 10/10/16   McDiarmid, Blane Ohara, MD  carvedilol (COREG) 12.5 MG tablet Take 1 tablet (12.5 mg total) by mouth 2 (two) times daily with a meal. 08/30/17   Meccariello, Bernita Raisin,  DO  cholecalciferol (VITAMIN D) 25 MCG (1000 UT) tablet Take 2,000 Units by mouth 3 (three) times daily.    [provider]  cloNIDine (CATAPRES - DOSED IN MG/24 HR) 0.2 mg/24hr patch Place 1 patch (0.2 mg total) onto the skin once a week. 08/09/17   Steve Rattler, DO  Darbepoetin Alfa (ARANESP) 40 MCG/0.4ML SOSY injection Inject 0.4 mLs (40 mcg total) into the skin every 30 (thirty) days.  09/28/17   Meccariello, Bernita Raisin, DO  doxazosin (CARDURA) 8 MG tablet Take 1 tablet (8 mg total) by mouth every evening. Patient taking differently: Take 8 mg by mouth daily.  12/01/16   Zenia Resides, MD  feeding supplement, ENSURE ENLIVE, (ENSURE ENLIVE) LIQD Take 237 mLs by mouth 3 (three) times daily between meals. 08/07/17   Steve Rattler, DO  ferrous sulfate (FEROSUL) 325 (65 FE) MG tablet Take 1 tablet (325 mg total) by mouth daily at 12 noon. Patient taking differently: Take 325 mg by mouth daily.  08/31/17   Meccariello, Bernita Raisin, DO  glucose blood (ONETOUCH VERIO) test strip Use as instructed to test three times daily. ICD-10 code: E11.22 10/15/16   McDiarmid, Blane Ohara, MD  Glycopyrrolate [facility] MAGNAIR STARTER KIT) 25 MCG/ML SOLN Inhale 1 mL into the lungs 2 (two) times a day.    [provider]  hydrALAZINE (APRESOLINE) 100 MG tablet TAKE ONE TABLET BY MOUTH EVERY EIGHT HOURS Patient taking differently: Take 100 mg by mouth every 8 (eight) hours.  11/14/16   [name], MD  insulin aspart (NOVOLOG) 100 UNIT/ML injection Inject 0-10 Units into the skin 3 (three) times daily before meals. <150= 0 units, 151-200= 2 units, 201-250= 4 units, 251-300= 6 units, 301-350= 8 units, 351-400= 10 units    [provider]  ipratropium-albuterol (DUONEB) 0.5-2.5 (3) MG/3ML SOLN Take 3 mLs by nebulization every 6 (six) hours as needed (sob).    [provider]  isosorbide mononitrate (IMDUR) 120 MG 24 hr tablet Take 1 tablet (120 mg total) by mouth daily. 10/02/17    Anderson, Chelsey L, DO  levothyroxine (SYNTHROID, LEVOTHROID) 100 MCG tablet Take 100 mcg by mouth daily before breakfast.     [provider]  nitroGLYCERIN (NITROSTAT) 0.4 MG SL tablet Place 1 tablet (0.4 mg total) under the tongue every 5 (five) minutes as needed for chest pain. Patient not taking: Reported on 09/16/2018 11/14/16   [name], MD  ondansetron (ZOFRAN) 4 MG tablet Take 4 mg by mouth every 8 (eight) hours as needed for nausea or vomiting.    [provider]  [facility] DELICA LANCETS 96E MISC 1 each by Does not apply route 3 (three) times daily. ICD-10 code: E11.22 Patient not taking: Reported on 07/15/2018 10/15/16   McDiarmid, Blane Ohara, MD  pantoprazole (PROTONIX) 40 MG tablet Take 1 tablet (40 mg total) by mouth 2 (two) times daily. 04/03/18   Patriciaann Clan, DO  polyethylene glycol (MIRALAX / GLYCOLAX) packet Take 17 g by mouth daily as needed for mild constipation or moderate constipation. 08/07/17   Steve Rattler, DO  torsemide (DEMADEX) 20 MG tablet Take 2 tablets (40 mg total) by mouth daily. 02/16/18   Larey Dresser, MD  umeclidinium bromide (INCRUSE ELLIPTA) 62.5 MCG/INH AEPB Inhale 1 puff into the lungs daily. 06/28/18   Mullis, Archie Endo, DO    Family History Family History  Problem Relation Age of Onset  . Cancer Mother        lung cancer  . Hypertension Mother   . Cancer Sister        patient thinks it was uterine cancer  . Hypertension Sister   . Heart attack Sister   . Stroke Neg Hx   . Colon cancer Neg Hx   . Esophageal cancer Neg Hx   . Inflammatory bowel disease Neg Hx   . Pancreatic cancer Neg Hx   . Liver disease Neg Hx   .  Rectal cancer Neg Hx   . Stomach cancer Neg Hx     Social History Social History   Tobacco Use  . Smoking status: Current Every Day Smoker    Packs/day: 1.00    Years: 46.00    Pack years: 46.00    Types: Cigarettes    Start date: 02/04/1967  . Smokeless tobacco: Never Used  Substance Use  Topics  . Alcohol use: No    Alcohol/week: 0.0 standard drinks  . Drug use: No     Allergies   Penicillins   Review of Systems Review of Systems  Constitutional: Negative for chills and fever.  HENT: Negative for ear pain and sore throat.   Eyes: Negative for pain and visual disturbance.  Respiratory: Negative for cough and shortness of breath.   Cardiovascular: Negative for chest pain and palpitations.  Gastrointestinal: Negative for abdominal pain and vomiting.  Genitourinary: Negative for dysuria and hematuria.  Musculoskeletal: Negative for arthralgias and back pain.  Skin: Negative for color change and rash.  Neurological: Negative for seizures and syncope.  All other systems reviewed and are negative.    Physical Exam Updated Vital Signs BP (!) 138/59   Pulse 70   Resp 16   Wt 81 kg   SpO2 100%   BMI 27.15 kg/m   Physical Exam Constitutional:      Comments: Chronically ill-appearing gentleman in no acute distress  HENT:     Head: Normocephalic.     Nose: Nose normal.     Mouth/Throat:     Mouth: Mucous membranes are moist.  Eyes:     Comments: Pale conjunctiva  Neck:     Musculoskeletal: No neck rigidity or muscular tenderness.  Cardiovascular:     Rate and Rhythm: Normal rate and regular rhythm.  Pulmonary:     Effort: Pulmonary effort is normal.     Breath sounds: Normal breath sounds.  Abdominal:     General: Abdomen is flat.     Palpations: Abdomen is soft.  Musculoskeletal:        General: No swelling or tenderness.  Skin:    General: Skin is warm and dry.     Capillary Refill: Capillary refill takes less than 2 seconds.     Coloration: Skin is pale.  Neurological:     General: No focal deficit present.     Mental Status: He is oriented to person, place, and time.  Psychiatric:        Mood and Affect: Mood normal.        Behavior: Behavior normal.      ED Treatments / Results  Labs (all labs ordered are listed, but only abnormal  results are displayed) Labs Reviewed  CBC WITH DIFFERENTIAL/PLATELET  BASIC METABOLIC PANEL  TYPE AND SCREEN    EKG None  Radiology No results found.  Procedures .Critical Care Performed by: Lucrezia Starch, MD Authorized by: Lucrezia Starch, MD   Critical care provider statement:    Critical care time (minutes):  32   Critical care was necessary to treat or prevent imminent or life-threatening deterioration of the following conditions:  Circulatory failure   Critical care was time spent personally by me on the following activities:  Discussions with consultants, evaluation of patient's response to treatment, examination of patient, ordering and performing treatments and interventions, ordering and review of laboratory studies, ordering and review of radiographic studies, pulse oximetry, re-evaluation of patient's condition, obtaining history from patient or surrogate, review of old charts,  blood draw for specimens and development of treatment plan with patient or surrogate   (including critical care time)  Medications Ordered in ED Medications - No data to display   Initial Impression / Assessment and Plan / ED Course  I have reviewed the triage vital signs and the nursing notes.  Pertinent labs & imaging results that were available during my care of the patient were reviewed by me and considered in my medical decision making (see chart for details).  Clinical Course as of Nov 01 1847  Mon Nov 01, 2018  1650 Completed initial assessment, patient has no acute complaints   [RD]  1749 Reviewed labs, will consult GI and admit to hospitalist service   [RD]  1815 Discussed with GI, given history and prior evaluation, no acute GI intervention needed, transfuse as needed   [RD]  1848 Discussed with hospitalist who will admit patient   [RD]    Clinical Course User Index [RD] Lucrezia Starch, MD       68 year old male with history of gastric adenocarcinoma presents  to ER with low hemoglobin.  Per review of prior GI note patient is not a surgical candidate or a candidate for chemotherapy.  Had received palliative radiation.  Patient had prior issues with GI loss requiring transfusion.  Today though hemoglobin down to 4.5.  High suspicion this is related to GI loss.  Reviewed case with [facility] on-call, given past assessment, ongoing recommendation remains the same.  Will transfuse 2 units, given significant drop, will admit to hospitalist service for further management.  Will admit to telemetry bed.   Final Clinical Impressions(s) / ED Diagnoses   Final diagnoses:  Anemia, unspecified type  Acute blood loss anemia  Acute kidney injury [facility])    ED Discharge Orders    None       Lucrezia Starch, MD 11/01/18 1850

## 2018-11-01 NOTE — ED Triage Notes (Signed)
Pt BIBA from [facility]). Pt sent out for hgb of 4.7 from weekly lab draw. Pt has hx of same. Pt has felt lethargic over the weekend.

## 2018-11-01 NOTE — H&P (Addendum)
TRH H&P    Patient Demographics:    Leonard [name], is a 68 y.o. male  MRN: [id number]  DOB - 09/10/1950  Admit Date - 11/01/2018  Referring MD/NP/PA: Dr. Grayland Jack  Outpatient Primary MD for the patient is Winfrey, Alcario Drought, MD  Patient coming from: [place] at [facility]  Chief complaint-   Symptomatic anemia   HPI:    Leonard [name]  is a 68 y.o. male, w hypertension, hyperlipidemia, Dm2 CAD, PVD s/p L AKA, CKD stage 4, Copd, smoker, w gastic cancer, chronic anemia, hx of XRT apparently recently admitted 07/23/2018 for suspected slow GI bleed from gastric cancer requiring transfusion of 2 units prbc, was at [place] and found to have Hgb on routine labs to be 4.7.  Down from his baseline of 8.1  Pt denies n/v, abd pain, diarrhea, brbpr.  Pt has black stool but taking iron.   In ED,  T 98.5, P 71  R 9  Bp 138/59  Pox 100% on RA Wt 81kg  Wbc 4.1, hgb 4.5, Plt 87 (baseline 81-104) Na 143, K 3.2, Bun 49, Creatinine 2.85 Glucose 242  Pt will be admitted for symptomatic anemia.    Review of systems:    In addition to the HPI above,  No Fever-chills, No Headache, No changes with Vision or hearing, No problems swallowing food or Liquids, No Chest pain, Cough No Abdominal pain, No Nausea or Vomiting, bowel movements are regular, No Blood in stool or Urine, No dysuria, No new skin rashes or bruises, No new joints pains-aches,  No new weakness, tingling, numbness in any extremity, No recent weight gain or loss, No polyuria, polydypsia or polyphagia, No significant Mental Stressors.  All other systems reviewed and are negative.    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  Last visits:  07/23/16: No change in meds. Worried that if we increase diuretics could lead to another AKI   CKD (chronic kidney disease), stage IV ([place])    07/07/2017   Claudication ([place])    Collapse of left talus 01/14/2017   COPD GOLD III  02/16/2014   Quit smoking 2014  - PFT's  04/06/13   FEV1 1.28 (44 % ) ratio 48  p no % improvement from saba p ?  prior to study with DLCO  43/54 % corrects to 97  % for alv volume - 01/31/2016  After extensive coaching HFA  effectiveness =    75% from baseline 0 > changed to sprivia respimat   - PFT's  03/17/2016  FEV1 2.25  (81 % ) ratio 88  p 2 % improvement from saba p spiriva respimat x 2  prior to study with   Coronary artery disease    Diabetes ([place])    Diabetic polyneuropathy associated with type 2 diabetes mellitus ([place]) 01/14/2017   Diabetic retinopathy ([place]) 11/13/2015   Per Dr. Erenest Rasher eye exam in 10/17/15, mild background diabetic retinopathy   Fatigue associated with anemia 01/21/2016   Gangrene of toe ([place])    right 5th/notes 01/04/2013    HCAP (healthcare-associated pneumonia)    Hematuria 06/27/2015   High cholesterol    History of tobacco use 08/23/2013   Smoked pack per day for 46 years Quit smoking - 02/03/2013    Hypertension    Hypothyroidism 11/22/2014   Idiopathic chronic gout of left foot without tophus    Iron deficiency anemia 08/03/2015   Left foot pain 10/29/2016   Long term (current) use of anticoagulants [Z79.01] 09/09/2016   MGUS (monoclonal gammopathy of unknown significance) 01/15/2015   Mixed hyperlipidemia  07/17/2008   Morbid obesity due to excess calories ([place]) 03/17/2016   PAD (peripheral artery disease) (HCC)    Pleural effusion    Pneumonitis 07/26/2017   Pulmonary artery hypertension ([place]) 07/25/2017   PVD - hx of Rt SFA PTA and s/p Rt 4-5th toe amp Dec 2014 04/05/2013   Resistant hypertension 11/25/2012   Respiratory failure ([place]) 08/13/2017   Respiratory failure with hypoxia ([place]) 09/23/2017   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   Secondary hypertension    Shortness of breath dyspnea    with exertion   Skin lesion of face 02/22/2015   SOB (shortness of breath)    Type 2 diabetes mellitus with pressure callus ([place]) 12/29/2016   Type II diabetes mellitus ([place])    Type II   Type II diabetes mellitus with complication [facility])       Past Surgical History:  Procedure Laterality Date   ABDOMINAL AORTOGRAM W/LOWER EXTREMITY N/A 03/12/2017   Procedure: ABDOMINAL AORTOGRAM W/LOWER EXTREMITY;  Surgeon: Conrad [place], MD;  Location: [place] [facility];  Service: Cardiovascular;  Laterality: N/A;   AMPUTATION Right 01/28/2013   Procedure: RAY AMPUTATION RIGHT  4th & 5th TOE;  Surgeon: Rosetta Posner, MD;  Location: [place];  Service: Vascular;  Laterality: Right;   AMPUTATION Left 03/15/2017   Procedure: AMPUTATION ABOVE KNEE;  Surgeon: Waynetta Sandy, MD;  Location: [place];  Service: Vascular;  Laterality: Left;   ANGIOPLASTY / STENTING FEMORAL Right 01/13/2013   superficial femoral artery x 2 (3 mm x 60 mm, 4 mm x 60 mm)   AV FISTULA PLACEMENT Left 02/11/2016   Procedure: LEFT UPPER ARM BRACHIAL CEPHALIC ARTERIOVENOUS (AV) FISTULA CREATION;  Surgeon: Conrad [place], MD;  Location: [place];  Service: Vascular;  Laterality: Left;   BIOPSY  03/29/2018   Procedure: BIOPSY;  Surgeon: Irving Copas., MD;  Location: [place];  Service: Gastroenterology;;   BIOPSY  05/31/2018   Procedure: BIOPSY;  Surgeon: Irving Copas., MD;  Location: [place];  Service:  Gastroenterology;;   COLONOSCOPY W/ POLYPECTOMY     CORONARY ARTERY BYPASS GRAFT N/A 04/07/2013   Procedure: CORONARY ARTERY BYPASS GRAFTING (CABG);  Surgeon: Ivin Poot, MD;  Location: [place];  Service: Open Heart Surgery;  Laterality: N/A;   ESOPHAGOGASTRODUODENOSCOPY (EGD) WITH PROPOFOL N/A 03/29/2018  Procedure: ESOPHAGOGASTRODUODENOSCOPY (EGD) WITH PROPOFOL;  Surgeon: Rush Landmark Telford Nab., MD;  Location: [place];  Service: Gastroenterology;  Laterality: N/A;   ESOPHAGOGASTRODUODENOSCOPY (EGD) WITH PROPOFOL N/A 05/31/2018   Procedure: ESOPHAGOGASTRODUODENOSCOPY (EGD) WITH PROPOFOL;  Surgeon: Rush Landmark Telford Nab., MD;  Location: [place];  Service: Gastroenterology;  Laterality: N/A;   EUS N/A 05/31/2018   Procedure: UPPER ENDOSCOPIC ULTRASOUND (EUS) RADIAL;  Surgeon: Irving Copas., MD;  Location: [place];  Service: Gastroenterology;  Laterality: N/A;   INTRAOPERATIVE TRANSESOPHAGEAL ECHOCARDIOGRAM N/A 04/07/2013   Procedure: INTRAOPERATIVE TRANSESOPHAGEAL ECHOCARDIOGRAM;  Surgeon: Ivin Poot, MD;  Location: [place];  Service: Open Heart Surgery;  Laterality: N/A;   IR FLUORO GUIDE CV LINE RIGHT  11/04/2017   IR REMOVAL TUN CV CATH W/O FL  11/27/2017   IR US GUIDE VASC ACCESS RIGHT  11/04/2017   LEFT HEART CATHETERIZATION WITH CORONARY ANGIOGRAM N/A 04/05/2013   Procedure: LEFT HEART CATHETERIZATION WITH CORONARY ANGIOGRAM;  Surgeon: Peter M Martinique, MD;  Location: [facility] CATH LAB;  Service: Cardiovascular;  Laterality: N/A;   LOWER EXTREMITY ANGIOGRAM Bilateral 01/06/2013   Procedure: LOWER EXTREMITY ANGIOGRAM;  Surgeon: Conrad [place], MD;  Location: [facility] CATH LAB;  Service: Cardiovascular;  Laterality: Bilateral;   LOWER EXTREMITY ANGIOGRAM Right 01/13/2013   Procedure: LOWER EXTREMITY ANGIOGRAM;  Surgeon: Conrad Roland, MD;  Location: [facility] CATH LAB;  Service: Cardiovascular;  Laterality: Right;   SKIN CANCER EXCISION  2017   THROAT SURGERY  1983   polyps    TONSILLECTOMY AND ADENOIDECTOMY  ~ 1965      Social History:      Social History   Tobacco Use   Smoking status: Current Every Day Smoker    Packs/day: 1.00    Years: 46.00    Pack years: 46.00    Types: Cigarettes    Start date: 02/04/1967   Smokeless tobacco: Never Used  Substance Use Topics   Alcohol use: No    Alcohol/week: 0.0 standard drinks       Family History :     Family History  Problem Relation Age of Onset   Cancer Mother        lung cancer   Hypertension Mother    Cancer Sister        patient thinks it was uterine cancer   Hypertension Sister    Heart attack Sister    Stroke Neg Hx    Colon cancer Neg Hx    Esophageal cancer Neg Hx    Inflammatory bowel disease Neg Hx    Pancreatic cancer Neg Hx    Liver disease Neg Hx    Rectal cancer Neg Hx    Stomach cancer Neg Hx        Home Medications:   Prior to Admission medications   Medication Sig Start Date End Date Taking? Authorizing Provider  acetaminophen (TYLENOL) 325 MG tablet Take 650 mg by mouth every 6 (six) hours as needed for fever.   Yes [provider]  amLODipine (NORVASC) 5 MG tablet Take 1 tablet (5 mg total) by mouth daily. 07/12/18  Yes Tammi Klippel, Sherin, DO  ascorbic acid (VITAMIN C) 500 MG tablet Take 500 mg by mouth daily.   Yes [provider]  atorvastatin (LIPITOR) 40 MG tablet TAKE ONE (1) TABLET BY MOUTH EVERY DAY AT 6:00PM Patient taking differently: Take 40 mg by mouth daily at 6 PM.  11/17/16  Yes Larey Dresser, MD  carvedilol (COREG) 12.5 MG tablet Take 1 tablet (12.5 mg  total) by mouth 2 (two) times daily with a meal. 08/30/17  Yes Meccariello, Bernita Raisin, DO  cholecalciferol (VITAMIN D) 25 MCG (1000 UT) tablet Take 2,000 Units by mouth 3 (three) times daily.   Yes [provider]  cloNIDine (CATAPRES - DOSED IN MG/24 HR) 0.2 mg/24hr patch Place 1 patch (0.2 mg total) onto the skin once a week. 08/09/17  Yes [name] Rattler, DO    Darbepoetin Alfa (ARANESP) 40 MCG/0.4ML SOSY injection Inject 0.4 mLs (40 mcg total) into the skin every 30 (thirty) days. 09/28/17  Yes Meccariello, Bernita Raisin, DO  doxazosin (CARDURA) 8 MG tablet Take 1 tablet (8 mg total) by mouth every evening. Patient taking differently: Take 8 mg by mouth daily.  12/01/16  Yes Hensel, Jamal Collin, MD  feeding supplement, ENSURE ENLIVE, (ENSURE ENLIVE) LIQD Take 237 mLs by mouth 3 (three) times daily between meals. 08/07/17  Yes Riccio, Angela C, DO  ferrous sulfate (FEROSUL) 325 (65 FE) MG tablet Take 1 tablet (325 mg total) by mouth daily at 12 noon. Patient taking differently: Take 325 mg by mouth daily.  08/31/17  Yes Meccariello, Bernita Raisin, DO  Glycopyrrolate (LONHALA MAGNAIR STARTER KIT) 25 MCG/ML SOLN Inhale 1 mL into the lungs 2 (two) times a day.   Yes [provider]  hydrALAZINE (APRESOLINE) 100 MG tablet TAKE ONE TABLET BY MOUTH EVERY EIGHT HOURS Patient taking differently: Take 100 mg by mouth every 8 (eight) hours.  11/14/16  Yes [name], MD  insulin aspart (NOVOLOG) 100 UNIT/ML injection Inject 0-10 Units into the skin 3 (three) times daily before meals. <150= 0 units, 151-200= 2 units, 201-250= 4 units, 251-300= 6 units, 301-350= 8 units, 351-400= 10 units   Yes [provider]  isosorbide mononitrate (IMDUR) 120 MG 24 hr tablet Take 1 tablet (120 mg total) by mouth daily. 10/02/17  Yes Anderson, Chelsey L, DO  levothyroxine (SYNTHROID, LEVOTHROID) 100 MCG tablet Take 100 mcg by mouth daily before breakfast.    Yes [provider]  nitroGLYCERIN (NITROSTAT) 0.4 MG SL tablet Place 1 tablet (0.4 mg total) under the tongue every 5 (five) minutes as needed for chest pain. Patient taking differently: Place 0.4 mg under the tongue every 5 (five) minutes as needed for chest pain. Chest pain 11/14/16  Yes [name], MD  ondansetron (ZOFRAN) 4 MG tablet Take 4 mg by mouth every 8 (eight) hours as needed for nausea  or vomiting.   Yes [provider]  pantoprazole (PROTONIX) 40 MG tablet Take 1 tablet (40 mg total) by mouth 2 (two) times daily. 04/03/18  Yes Beard, Aldona Bar N, DO  polyethylene glycol (MIRALAX / GLYCOLAX) packet Take 17 g by mouth daily as needed for mild constipation or moderate constipation. 08/07/17  Yes Riccio, Angela C, DO  torsemide (DEMADEX) 20 MG tablet Take 2 tablets (40 mg total) by mouth daily. 02/16/18  Yes Larey Dresser, MD  umeclidinium bromide (INCRUSE ELLIPTA) 62.5 MCG/INH AEPB Inhale 1 puff into the lungs daily. 06/28/18  Yes Mullis, Kiersten P, DO  Blood Glucose Monitoring Suppl (ONETOUCH VERIO FLEX SYSTEM) w/Device KIT 1 kit by Does not apply route daily. 10/10/16   McDiarmid, Blane Ohara, MD  glucose blood (ONETOUCH VERIO) test strip Use as instructed to test three times daily. ICD-10 code: E11.22 10/15/16   McDiarmid, Blane Ohara, MD  [facility] DELICA LANCETS 54U MISC 1 each by Does not apply route 3 (three) times daily. ICD-10 code: E11.22 Patient not taking: Reported  on 07/15/2018 10/15/16   McDiarmid, Blane Ohara, MD     Allergies:     Allergies  Allergen Reactions   Penicillins Other (See Comments)    Has patient had a PCN reaction causing immediate rash, facial/tongue/throat swelling, SOB or lightheadedness with hypotension: Unknown Has patient had a PCN reaction causing severe rash involving mucus membranes or skin necrosis: Unknown Has patient had a PCN reaction that required hospitalization: Unknown Has patient had a PCN reaction occurring within the last 10 years: Unknown If all of the above answers are "NO", then may proceed with Cephalosporin use.      Physical Exam:   Vitals  Blood pressure (!) 152/64, pulse 73, resp. rate 18, weight 81 kg, SpO2 100 %.  1.  General: axoxo3 (person, place, year)  2. Psychiatric: euthymic  3. Neurologic: cn2-12 intact, reflexes 2+ symmetric in the upper ext Motor 5/5 in all 3 ext  4. HEENMT:  Anicteric, pale  conjunctiva, pupils 1.35m symmetric, direct, consensual, intact  5. Respiratory : prolonged exp phase, no crackles, no wheezing  6. Cardiovascular : rrr s1, s2,   7. Gastrointestinal:  Abd: soft, nt, nd, +bs  8. Skin:  Ext: no c/c/e,  No rash  9.Musculoskeletal:  Good ROM    Data Review:    CBC Recent Labs  Lab 11/01/18 1613  WBC 4.1  HGB 4.5*  HCT 14.8*  PLT 87*  MCV 98.7  MCH 30.0  MCHC 30.4  RDW 17.0*  LYMPHSABS 0.5*  MONOABS 0.4  EOSABS 0.5  BASOSABS 0.0   ------------------------------------------------------------------------------------------------------------------  Results for orders placed or performed during the hospital encounter of 11/01/18 (from the past 48 hour(s))  CBC with Differential     Status: Abnormal   Collection Time: 11/01/18  4:13 PM  Result Value Ref Range   WBC 4.1 4.0 - 10.5 K/uL   RBC 1.50 (L) 4.22 - 5.81 MIL/uL   Hemoglobin 4.5 (LL) 13.0 - 17.0 g/dL    Comment: This critical result has verified and been [facility] RN by KSarita Bottomon 09 28 2020 at 1720, and has been read back. CRITICAL RESULT VERIFIED   HCT 14.8 (L) 39.0 - 52.0 %   MCV 98.7 80.0 - 100.0 fL   MCH 30.0 26.0 - 34.0 pg   MCHC 30.4 30.0 - 36.0 g/dL   RDW 17.0 (H) 11.5 - 15.5 %   Platelets 87 (L) 150 - 400 K/uL    Comment: REPEATED TO VERIFY PLATELET COUNT CONFIRMED BY SMEAR SPECIMEN CHECKED FOR CLOTS Immature Platelet Fraction may be clinically indicated, consider ordering this additional test [id number]   nRBC 0.0 0.0 - 0.2 %   Neutrophils Relative % 64 %   Neutro Abs 2.6 1.7 - 7.7 K/uL   Lymphocytes Relative 13 %   Lymphs Abs 0.5 (L) 0.7 - 4.0 K/uL   Monocytes Relative 9 %   Monocytes Absolute 0.4 0.1 - 1.0 K/uL   Eosinophils Relative 12 %   Eosinophils Absolute 0.5 0.0 - 0.5 K/uL   Basophils Relative 1 %   Basophils Absolute 0.0 0.0 - 0.1 K/uL   Immature Granulocytes 1 %   Abs Immature Granulocytes 0.03 0.00 - 0.07 K/uL    Comment:  Performed [facility] [address] [place] [address] Basic metabolic panel     Status: Abnormal   Collection Time: 11/01/18  4:13 PM  Result Value Ref Range   Sodium 143 135 - 145 mmol/L  Potassium 3.2 (L) 3.5 - 5.1 mmol/L   Chloride 112 (H) 98 - 111 mmol/L   CO2 19 (L) 22 - 32 mmol/L   Glucose, Bld 242 (H) 70 - 99 mg/dL   BUN 49 (H) 8 - 23 mg/dL   Creatinine, Ser 2.85 (H) 0.61 - 1.24 mg/dL   Calcium 8.1 (L) 8.9 - 10.3 mg/dL   GFR calc non Af Amer 22 (L) >60 mL/min   GFR calc Af Amer 25 (L) >60 mL/min   Anion gap 12 5 - 15    Comment: Performed at [facility], [place] [address], [place], [place] [address]  Type and screen [place]     Status: None (Preliminary result)   Collection Time: 11/01/18  4:13 PM  Result Value Ref Range   ABO/RH(D) B NEG    Antibody Screen NEG    Sample Expiration 11/04/2018,2359    Unit Number [id number]    Blood Component Type RBC, LR IRR    Unit division 00    Status of Unit ALLOCATED    Transfusion Status OK TO TRANSFUSE    Crossmatch Result Compatible    Unit Number [id number]    Blood Component Type RBC, LR IRR    Unit division 00    Status of Unit ISSUED    Transfusion Status OK TO TRANSFUSE    Crossmatch Result      Compatible Performed at [facility], [place] [address], [place], [place] [address]   Prepare RBC     Status: None   Collection Time: 11/01/18  6:00 PM  Result Value Ref Range   Order Confirmation      ORDER PROCESSED BY BLOOD BANK Performed at [facility], [place] [address], [place], [place] [address]    *Note: Due to a large number of results and/or encounters for the requested time period, some results have not been displayed. A complete set of results can be found in Results Review.    Chemistries  Recent Labs  Lab 11/01/18 1613  NA 143  K 3.2*  CL 112*  CO2 19*  GLUCOSE 242*  BUN 49*  CREATININE  2.85*  CALCIUM 8.1*   ------------------------------------------------------------------------------------------------------------------  ------------------------------------------------------------------------------------------------------------------ GFR: Estimated Creatinine Clearance: 24.3 mL/min (A) (by C-G formula based on SCr of 2.85 mg/dL (H)). Liver Function Tests: No results for input(s): AST, ALT, ALKPHOS, BILITOT, PROT, ALBUMIN in the last 168 hours. No results for input(s): LIPASE, AMYLASE in the last 168 hours. No results for input(s): AMMONIA in the last 168 hours. Coagulation Profile: No results for input(s): INR, PROTIME in the last 168 hours. Cardiac Enzymes: No results for input(s): CKTOTAL, CKMB, CKMBINDEX, TROPONINI in the last 168 hours. BNP (last 3 results) No results for input(s): PROBNP in the last 8760 hours. HbA1C: No results for input(s): HGBA1C in the last 72 hours. CBG: No results for input(s): GLUCAP in the last 168 hours. Lipid Profile: No results for input(s): CHOL, HDL, LDLCALC, TRIG, CHOLHDL, LDLDIRECT in the last 72 hours. Thyroid Function Tests: No results for input(s): TSH, T4TOTAL, FREET4, T3FREE, THYROIDAB in the last 72 hours. Anemia Panel: No results for input(s): VITAMINB12, FOLATE, FERRITIN, TIBC, IRON, RETICCTPCT in the last 72 hours.  --------------------------------------------------------------------------------------------------------------- Urine analysis:    Component Value Date/Time   COLORURINE YELLOW 06/24/2018 0628   APPEARANCEUR CLOUDY (A) 06/24/2018 0628   LABSPEC 1.012 06/24/2018 0628   PHURINE 7.0 06/24/2018 0628   GLUCOSEU NEGATIVE 06/24/2018 0628   HGBUR NEGATIVE 06/24/2018  [place] 06/24/2018 0628   BILIRUBINUR NEG 06/26/2015 1540   KETONESUR NEGATIVE 06/24/2018 0628   PROTEINUR 100 (A) 06/24/2018 0628   UROBILINOGEN 1.0 06/26/2015 1540   UROBILINOGEN 0.2 2020-06-213 2156   NITRITE NEGATIVE  06/24/2018 0628   LEUKOCYTESUR LARGE (A) 06/24/2018 0628      Imaging Results:    No results found.     Assessment & Plan:    Active Problems:   Mixed hyperlipidemia   CAD (coronary artery disease)   Hypothyroidism   CKD (chronic kidney disease) stage 4, GFR 15-29 ml/min (HCC)   Type II diabetes mellitus with complication (HCC)   Iron deficiency anemia   Diabetic polyneuropathy associated with type 2 diabetes mellitus (HCC)   Symptomatic anemia  Symptomatic anemia likely secondary to gastric cancer pt was previously not a candidate for surgical intervention or chemo, pt received palliative radiation protonix 51m iv x1 then 8361mhr Transfuse 2 units prbc NPO for now Pt is suppose to have EGD on 11/08/2018 per niece Please consult GI in am to have EGD while admitted if possible per niece  Iron deficiency anemia Check cbc after transfusion  Hypertension, PAD Cont Amlodipine 61m20mo qday Cont Carvedilol 12.61mg12m bid Cont Clonidine  Cont Hydralazine Cont Imdur Cont Torsemide  Hyperlipidemia Cont Lipitor 40mg61mqhs  Dm2 w CKD stage4 fsbs ac and qhs, ISS  Hypothyroidism Cont Levothyroxine 100mic84mams po qday  Bph Cont Cardura 8mg po74ms   DVT Prophylaxis-   SCDs   AM Labs Ordered, also please review Full Orders  Family Communication: Admission, patients condition and plan of care including tests being ordered have been discussed with the patient who indicate understanding and agree with the plan and Code Status.  Code Status:  FULL CODE per patient, notified niece that patient admitted to [facility]  [facility] status: Observation: Based on patients clinical presentation and evaluation of above clinical data, I have made determination that patient meets observation criteria at this time.   Time spent in minutes :  70 minutes    KJani Gravel 11/01/2018 at 6:59 PM

## 2018-11-02 ENCOUNTER — Inpatient Hospital Stay (HOSPITAL_COMMUNITY): Payer: Medicare Other | Admitting: Certified Registered Nurse Anesthetist

## 2018-11-02 ENCOUNTER — Encounter (HOSPITAL_COMMUNITY): Payer: Medicare Other

## 2018-11-02 ENCOUNTER — Encounter (HOSPITAL_COMMUNITY): Payer: Self-pay | Admitting: Internal Medicine

## 2018-11-02 ENCOUNTER — Encounter (HOSPITAL_COMMUNITY)
Admission: EM | Disposition: A | Discharge status: Skilled Nursing Facility | Payer: Self-pay | Source: Skilled Nursing Facility | Attending: Student

## 2018-11-02 DIAGNOSIS — E782 Mixed hyperlipidemia: Secondary | ICD-10-CM | POA: Diagnosis present

## 2018-11-02 DIAGNOSIS — R531 Weakness: Secondary | ICD-10-CM | POA: Diagnosis not present

## 2018-11-02 DIAGNOSIS — J9611 Chronic respiratory failure with hypoxia: Secondary | ICD-10-CM | POA: Diagnosis not present

## 2018-11-02 DIAGNOSIS — D509 Iron deficiency anemia, unspecified: Secondary | ICD-10-CM | POA: Diagnosis not present

## 2018-11-02 DIAGNOSIS — R0902 Hypoxemia: Secondary | ICD-10-CM | POA: Diagnosis not present

## 2018-11-02 DIAGNOSIS — E038 Other specified hypothyroidism: Secondary | ICD-10-CM | POA: Diagnosis not present

## 2018-11-02 DIAGNOSIS — C169 Malignant neoplasm of stomach, unspecified: Secondary | ICD-10-CM | POA: Diagnosis not present

## 2018-11-02 DIAGNOSIS — Z801 Family history of malignant neoplasm of trachea, bronchus and lung: Secondary | ICD-10-CM | POA: Diagnosis not present

## 2018-11-02 DIAGNOSIS — N184 Chronic kidney disease, stage 4 (severe): Secondary | ICD-10-CM

## 2018-11-02 DIAGNOSIS — C164 Malignant neoplasm of pylorus: Secondary | ICD-10-CM | POA: Diagnosis not present

## 2018-11-02 DIAGNOSIS — J449 Chronic obstructive pulmonary disease, unspecified: Secondary | ICD-10-CM | POA: Diagnosis not present

## 2018-11-02 DIAGNOSIS — I130 Hypertensive heart and chronic kidney disease with heart failure and stage 1 through stage 4 chronic kidney disease, or unspecified chronic kidney disease: Secondary | ICD-10-CM | POA: Diagnosis present

## 2018-11-02 DIAGNOSIS — I739 Peripheral vascular disease, unspecified: Secondary | ICD-10-CM | POA: Diagnosis present

## 2018-11-02 DIAGNOSIS — I5032 Chronic diastolic (congestive) heart failure: Secondary | ICD-10-CM | POA: Diagnosis present

## 2018-11-02 DIAGNOSIS — I2510 Atherosclerotic heart disease of native coronary artery without angina pectoris: Secondary | ICD-10-CM | POA: Diagnosis not present

## 2018-11-02 DIAGNOSIS — E1142 Type 2 diabetes mellitus with diabetic polyneuropathy: Secondary | ICD-10-CM | POA: Diagnosis present

## 2018-11-02 DIAGNOSIS — Z89612 Acquired absence of left leg above knee: Secondary | ICD-10-CM | POA: Diagnosis not present

## 2018-11-02 DIAGNOSIS — D62 Acute posthemorrhagic anemia: Secondary | ICD-10-CM

## 2018-11-02 DIAGNOSIS — D649 Anemia, unspecified: Secondary | ICD-10-CM | POA: Diagnosis present

## 2018-11-02 DIAGNOSIS — E1151 Type 2 diabetes mellitus with diabetic peripheral angiopathy without gangrene: Secondary | ICD-10-CM | POA: Diagnosis present

## 2018-11-02 DIAGNOSIS — M6281 Muscle weakness (generalized): Secondary | ICD-10-CM | POA: Diagnosis present

## 2018-11-02 DIAGNOSIS — Z79899 Other long term (current) drug therapy: Secondary | ICD-10-CM | POA: Diagnosis not present

## 2018-11-02 DIAGNOSIS — C163 Malignant neoplasm of pyloric antrum: Secondary | ICD-10-CM | POA: Diagnosis not present

## 2018-11-02 DIAGNOSIS — J90 Pleural effusion, not elsewhere classified: Secondary | ICD-10-CM | POA: Diagnosis present

## 2018-11-02 DIAGNOSIS — M2550 Pain in unspecified joint: Secondary | ICD-10-CM | POA: Diagnosis not present

## 2018-11-02 DIAGNOSIS — K922 Gastrointestinal hemorrhage, unspecified: Secondary | ICD-10-CM | POA: Diagnosis not present

## 2018-11-02 DIAGNOSIS — I10 Essential (primary) hypertension: Secondary | ICD-10-CM | POA: Diagnosis not present

## 2018-11-02 DIAGNOSIS — E118 Type 2 diabetes mellitus with unspecified complications: Secondary | ICD-10-CM | POA: Diagnosis not present

## 2018-11-02 DIAGNOSIS — C170 Malignant neoplasm of duodenum: Secondary | ICD-10-CM | POA: Diagnosis present

## 2018-11-02 DIAGNOSIS — Z86718 Personal history of other venous thrombosis and embolism: Secondary | ICD-10-CM | POA: Diagnosis not present

## 2018-11-02 DIAGNOSIS — Z20828 Contact with and (suspected) exposure to other viral communicable diseases: Secondary | ICD-10-CM | POA: Diagnosis not present

## 2018-11-02 DIAGNOSIS — E11319 Type 2 diabetes mellitus with unspecified diabetic retinopathy without macular edema: Secondary | ICD-10-CM | POA: Diagnosis present

## 2018-11-02 DIAGNOSIS — E039 Hypothyroidism, unspecified: Secondary | ICD-10-CM | POA: Diagnosis present

## 2018-11-02 DIAGNOSIS — Z85828 Personal history of other malignant neoplasm of skin: Secondary | ICD-10-CM | POA: Diagnosis not present

## 2018-11-02 DIAGNOSIS — Z923 Personal history of irradiation: Secondary | ICD-10-CM | POA: Diagnosis not present

## 2018-11-02 DIAGNOSIS — Z7401 Bed confinement status: Secondary | ICD-10-CM | POA: Diagnosis not present

## 2018-11-02 DIAGNOSIS — Z951 Presence of aortocoronary bypass graft: Secondary | ICD-10-CM | POA: Diagnosis not present

## 2018-11-02 DIAGNOSIS — K921 Melena: Secondary | ICD-10-CM | POA: Diagnosis present

## 2018-11-02 DIAGNOSIS — E872 Acidosis: Secondary | ICD-10-CM | POA: Diagnosis present

## 2018-11-02 HISTORY — PX: ESOPHAGOGASTRODUODENOSCOPY (EGD) WITH PROPOFOL: SHX5813

## 2018-11-02 LAB — COMPREHENSIVE METABOLIC PANEL
ALT: 11 U/L (ref 0–44)
AST: 14 U/L — ABNORMAL LOW (ref 15–41)
Albumin: 3 g/dL — ABNORMAL LOW (ref 3.5–5.0)
Alkaline Phosphatase: 101 U/L (ref 38–126)
Anion gap: 10 (ref 5–15)
BUN: 48 mg/dL — ABNORMAL HIGH (ref 8–23)
CO2: 21 mmol/L — ABNORMAL LOW (ref 22–32)
Calcium: 8 mg/dL — ABNORMAL LOW (ref 8.9–10.3)
Chloride: 113 mmol/L — ABNORMAL HIGH (ref 98–111)
Creatinine, Ser: 2.69 mg/dL — ABNORMAL HIGH (ref 0.61–1.24)
GFR calc Af Amer: 27 mL/min — ABNORMAL LOW (ref >60)
GFR calc non Af Amer: 23 mL/min — ABNORMAL LOW (ref >60)
Glucose, Bld: 135 mg/dL — ABNORMAL HIGH (ref 70–99)
Potassium: 3 mmol/L — ABNORMAL LOW (ref 3.5–5.1)
Sodium: 144 mmol/L (ref 135–145)
Total Bilirubin: 1.2 mg/dL (ref 0.3–1.2)
Total Protein: 5.4 g/dL — ABNORMAL LOW (ref 6.5–8.1)

## 2018-11-02 LAB — GLUCOSE, CAPILLARY
Glucose-Capillary: 119 mg/dL — ABNORMAL HIGH (ref 70–99)
Glucose-Capillary: 130 mg/dL — ABNORMAL HIGH (ref 70–99)
Glucose-Capillary: 134 mg/dL — ABNORMAL HIGH (ref 70–99)
Glucose-Capillary: 139 mg/dL — ABNORMAL HIGH (ref 70–99)
Glucose-Capillary: 142 mg/dL — ABNORMAL HIGH (ref 70–99)
Glucose-Capillary: 151 mg/dL — ABNORMAL HIGH (ref 70–99)
Glucose-Capillary: 164 mg/dL — ABNORMAL HIGH (ref 70–99)

## 2018-11-02 LAB — CBC
HCT: 20.2 % — ABNORMAL LOW (ref 39.0–52.0)
Hemoglobin: 6.5 g/dL — CL (ref 13.0–17.0)
MCH: 30.8 pg (ref 26.0–34.0)
MCHC: 32.2 g/dL (ref 30.0–36.0)
MCV: 95.7 fL (ref 80.0–100.0)
Platelets: 88 10*3/uL — ABNORMAL LOW (ref 150–400)
RBC: 2.11 MIL/uL — ABNORMAL LOW (ref 4.22–5.81)
RDW: 16.4 % — ABNORMAL HIGH (ref 11.5–15.5)
WBC: 5.3 10*3/uL (ref 4.0–10.5)
nRBC: 0 % (ref 0.0–0.2)

## 2018-11-02 LAB — FOLATE: Folate: 4.6 ng/mL — ABNORMAL LOW (ref >5.9)

## 2018-11-02 LAB — HEMOGLOBIN AND HEMATOCRIT, BLOOD
HCT: 23.7 % — ABNORMAL LOW (ref 39.0–52.0)
Hemoglobin: 7.8 g/dL — ABNORMAL LOW (ref 13.0–17.0)

## 2018-11-02 LAB — RETICULOCYTES
Immature Retic Fract: 25.9 % — ABNORMAL HIGH (ref 2.3–15.9)
RBC.: 2.59 MIL/uL — ABNORMAL LOW (ref 4.22–5.81)
Retic Count, Absolute: 102.6 10*3/uL (ref 19.0–186.0)
Retic Ct Pct: 4 % — ABNORMAL HIGH (ref 0.4–3.1)

## 2018-11-02 LAB — SARS CORONAVIRUS 2 (TAT 6-24 HRS): SARS Coronavirus 2: NEGATIVE

## 2018-11-02 LAB — PREPARE RBC (CROSSMATCH)

## 2018-11-02 SURGERY — ESOPHAGOGASTRODUODENOSCOPY (EGD) WITH PROPOFOL
Anesthesia: Monitor Anesthesia Care

## 2018-11-02 MED ORDER — CHLORHEXIDINE GLUCONATE 0.12 % MT SOLN
15.0000 mL | Freq: Two times a day (BID) | OROMUCOSAL | Status: DC
  Administered 2018-11-02: 15 mL via OROMUCOSAL
  Filled 2018-11-02: qty 15

## 2018-11-02 MED ORDER — SODIUM CHLORIDE 0.9% IV SOLUTION
Freq: Once | INTRAVENOUS | Status: AC
  Administered 2018-11-02: 10:00:00 via INTRAVENOUS

## 2018-11-02 MED ORDER — PROPOFOL 500 MG/50ML IV EMUL
INTRAVENOUS | Status: DC | PRN
  Administered 2018-11-02: 150 ug/kg/min via INTRAVENOUS

## 2018-11-02 MED ORDER — ORAL CARE MOUTH RINSE: 15.0000 mL | Freq: Two times a day (BID) | OROMUCOSAL | Status: DC

## 2018-11-02 MED ORDER — PANTOPRAZOLE SODIUM 40 MG PO TBEC
40.0000 mg | DELAYED_RELEASE_TABLET | Freq: Two times a day (BID) | ORAL | Status: DC
  Administered 2018-11-02 – 2018-11-07 (×10): 40 mg via ORAL
  Filled 2018-11-02 (×10): qty 1

## 2018-11-02 MED ORDER — PANTOPRAZOLE SODIUM 40 MG IV SOLR
40.0000 mg | Freq: Two times a day (BID) | INTRAVENOUS | Status: DC
  Administered 2018-11-02: 40 mg via INTRAVENOUS
  Filled 2018-11-02: qty 40

## 2018-11-02 MED ORDER — TORSEMIDE 20 MG PO TABS
40.0000 mg | ORAL_TABLET | Freq: Every day | ORAL | Status: DC
  Administered 2018-11-03 – 2018-11-07 (×5): 40 mg via ORAL
  Filled 2018-11-02 (×5): qty 2

## 2018-11-02 MED ORDER — POTASSIUM CHLORIDE CRYS ER 20 MEQ PO TBCR
40.0000 meq | EXTENDED_RELEASE_TABLET | Freq: Once | ORAL | Status: AC
  Administered 2018-11-02: 09:00:00 40 meq via ORAL
  Filled 2018-11-02: qty 2

## 2018-11-02 MED ORDER — PROPOFOL 10 MG/ML IV BOLUS
INTRAVENOUS | Status: DC | PRN
  Administered 2018-11-02: 30 mg via INTRAVENOUS

## 2018-11-02 MED ORDER — FUROSEMIDE 10 MG/ML IJ SOLN
60.0000 mg | Freq: Once | INTRAMUSCULAR | Status: AC
  Administered 2018-11-02: 09:00:00 60 mg via INTRAVENOUS
  Filled 2018-11-02: qty 6

## 2018-11-02 MED ORDER — LIDOCAINE 2% (20 MG/ML) 5 ML SYRINGE
INTRAMUSCULAR | Status: DC | PRN
  Administered 2018-11-02: 80 mg via INTRAVENOUS

## 2018-11-02 MED ORDER — SUCRALFATE 1 G PO TABS
1.0000 g | ORAL_TABLET | Freq: Three times a day (TID) | ORAL | Status: DC
  Administered 2018-11-02 – 2018-11-07 (×20): 1 g via ORAL
  Filled 2018-11-02 (×20): qty 1

## 2018-11-02 SURGICAL SUPPLY — 15 items

## 2018-11-02 NOTE — Progress Notes (Signed)
PROGRESS NOTE    Leonard [name]  HAL:[id number] DOB: 02/11/1950 DOA: 11/01/2018 PCP: Kathrene Alu, MD    Brief Narrative:  68 year old male who presented symptomatic anemia.  He does have significant past medical history for hypertension, dyslipidemia, type 2 diabetes mellitus, coronary artery disease, peripheral vascular disease status post left AKA, chronic kidney disease stage IV, COPD, chronic anemia, recent diagnosis of gastric adenocarcinoma cancer (status post radiation as currently deemed non chemo or surgical candidate) who was recently admitted July 23, 2018 due to gastrointestinal bleed.  Then he required 2 units of packed red blood cell transfusion.  On the day of admission outpatient follow-up showed a hemoglobin of 4.7.  On his initial physical examination his blood pressure was 152/64, heart rate 73, respiratory rate 18, oxygen saturation 100%.  He was pale, his lungs had no rales or rhonchi, heart S1-S2 present and rhythmic, the abdomen was soft and no lower extremity edema Sodium 143, potassium 3.2, chloride 112, bicarb 19, glucose 242, BUN 49, creatinine 2.5, white count 4.1, hemoglobin 4.5, hematocrit 14.8, platelets 87.  SARS COVID-19 was negative.  Patient was admitted to the hospital with working diagnosis symptomatic anemia due to gastric cancer.  Patient had 2 units PRBC transfusion with Hgb up to 6.5 and Hct at 20.2. Will need further PRBC transfusion to target Hgb above 7.   Gastroenterology consulted for possible EGD on this admission.   Assessment & Plan:   Principal Problem:   Acute GI bleeding Active Problems:   Mixed hyperlipidemia   CAD (coronary artery disease)   Hypothyroidism   CKD (chronic kidney disease) stage 4, GFR 15-29 ml/min (HCC)   Type II diabetes mellitus with complication (HCC)   Iron deficiency anemia   Diabetic polyneuropathy associated with type 2 diabetes mellitus (HCC)   Symptomatic anemia   1. Symptomatic anemia due to acute  upper GI bleed, in the setting of gastric adenocarcinoma. Hgb this am 6,5 with Hct at 20,2 after 2 units PRBC, continue to have weakness, but no chest pain or dyspnea. Scheduled this am for third [facility] transfusion. Will continue telemetry monitor in the progressive care unit due high risk of worsening anemia.   Patient follows with Dr. Rush Landmark from GI as outpatient, will consult for consult, patient will need EGD on this admission. Follow on H&H post PRBC transfusion, target Hgb above 7. Continue pantoprazole IV, will change from drip to bid dosing for now.   Patient has deemed not candidate for chemotherapy due to CKD stage IV and not candidate for surgery. Patient wants to be treated, plan as outpatient was to repeat endoscopy after radiation therapy to assess therapeutic options. (old records personally reviewed)  2.  CKD stage IV with hypokalemia. Patient has tolerated well PRBC transfusion #2, with no signs of significant volume overload, he received furosemide 40 mg IV. Renal function with serum Cr at 2,69 with K at 3,0 and bicarbonate at 21. Will give second dose of furosemide (60 mg) this am to prevent volume overload. Resume torsemide in am.  K correction with Kcl, follow with renal panel in am.   3. HTN. Blood pressure systolic 735 mmHG, will continue blood pressure control with amlodipine, carvedilol, clonidine, hydralazine.  4. T2DM. Patient is npo for possible procedure, will continue insulin sliding scale for glucose cover and monitoring.   5. Hypothyroid. Continue with levothyroxine.   6. Iron deficiency anemia. Continue with iron supplementation.    DVT prophylaxis: scd   Code Status: full Family Communication:  no family at the bedside  Disposition Plan/ discharge barriers: patient continue to be anemic after 2 units pbrc transfusion continue to be at high risk for bleeding, will need further workup with upper endoscopy, will change to inpatient.   Body mass index is 27.15  kg/m. Malnutrition Type:      Malnutrition Characteristics:      Nutrition Interventions:     RN Pressure Injury Documentation:     Consultants:   GI   Procedures:     Antimicrobials:       Subjective: Patient continue to be very weak and deconditioned, denies any chest pain or dyspnea, no nausea or vomiting. No melena or hematemesis.   Objective: Vitals:   11/02/18 0200 11/02/18 0400 11/02/18 0500 11/02/18 0600  BP: (!) 159/50 (!) 161/52  (!) 187/55  Pulse: 68 73  78  Resp: 12 15  15   Temp:  98.5 F (36.9 C)    TempSrc:  Axillary    SpO2: 97% 99%  97%  Weight:   81 kg   Height:        Intake/Output Summary (Last 24 hours) at 11/02/2018 0802 Last data filed at 11/02/2018 0500 Gross per 24 hour  Intake 997.6 ml  Output 950 ml  Net 47.6 ml   Filed Weights   11/01/18 1555 11/01/18 2100 11/02/18 0500  Weight: 81 kg 81.2 kg 81 kg    Examination:   General: Not in pain or dyspnea, deconditioned  Neurology: Awake and alert, non focal  E ENT: positive pallor, no icterus, oral mucosa moist Cardiovascular: No JVD. S1-S2 present, rhythmic, no gallops, rubs, or murmurs. +++ edema on the right lower extremity edema. Pulmonary: positive breath sounds bilaterally, adequate air movement, no wheezing, rhonchi or rales. Gastrointestinal. Abdomen with no organomegaly, non tender, no rebound or guarding. Mild distended.  Skin. No rashes Musculoskeletal: no joint deformities/ left AKA.      Data Reviewed: I have personally reviewed following labs and imaging studies  CBC: Recent Labs  Lab 11/01/18 1613 11/02/18 0531  WBC 4.1 5.3  NEUTROABS 2.6  --   HGB 4.5* 6.5*  HCT 14.8* 20.2*  MCV 98.7 95.7  PLT 87* 88*   Basic Metabolic Panel: Recent Labs  Lab 11/01/18 1613 11/02/18 0531  NA 143 144  K 3.2* 3.0*  CL 112* 113*  CO2 19* 21*  GLUCOSE 242* 135*  BUN 49* 48*  CREATININE 2.85* 2.69*  CALCIUM 8.1* 8.0*   GFR: Estimated Creatinine  Clearance: 25.8 mL/min (A) (by C-G formula based on SCr of 2.69 mg/dL (H)). Liver Function Tests: Recent Labs  Lab 11/02/18 0531  AST 14*  ALT 11  ALKPHOS 101  BILITOT 1.2  PROT 5.4*  ALBUMIN 3.0*   No results for input(s): LIPASE, AMYLASE in the last 168 hours. No results for input(s): AMMONIA in the last 168 hours. Coagulation Profile: No results for input(s): INR, PROTIME in the last 168 hours. Cardiac Enzymes: No results for input(s): CKTOTAL, CKMB, CKMBINDEX, TROPONINI in the last 168 hours. BNP (last 3 results) No results for input(s): PROBNP in the last 8760 hours. HbA1C: No results for input(s): HGBA1C in the last 72 hours. CBG: Recent Labs  Lab 11/01/18 2108 11/01/18 2112 11/02/18 0052 11/02/18 0354  GLUCAP 220* 192* 142* 119*   Lipid Profile: No results for input(s): CHOL, HDL, LDLCALC, TRIG, CHOLHDL, LDLDIRECT in the last 72 hours. Thyroid Function Tests: No results for input(s): TSH, T4TOTAL, FREET4, T3FREE, THYROIDAB in the last 72 hours. Anemia  Panel: No results for input(s): VITAMINB12, FOLATE, FERRITIN, TIBC, IRON, RETICCTPCT in the last 72 hours.    Radiology Studies: I have reviewed all of the imaging during this hospital visit personally     Scheduled Meds: . sodium chloride   Intravenous Once  . sodium chloride   Intravenous Once  . amLODipine  5 mg Oral Daily  . atorvastatin  40 mg Oral q1800  . carvedilol  12.5 mg Oral BID WC  . Chlorhexidine Gluconate Cloth  6 each Topical Daily  . [START ON 11/06/2018] cloNIDine  0.2 mg Transdermal Weekly  . doxazosin  8 mg Oral Daily  . feeding supplement (ENSURE ENLIVE)  237 mL Oral TID BM  . ferrous sulfate  325 mg Oral Daily  . hydrALAZINE  100 mg Oral Q8H  . insulin aspart  0-9 Units Subcutaneous Q4H  . isosorbide mononitrate  120 mg Oral Daily  . levothyroxine  100 mcg Oral QAC breakfast  . torsemide  40 mg Oral Daily   Continuous Infusions: . sodium chloride    . pantoprozole (PROTONIX)  infusion 8 mg/hr (11/02/18 0500)     LOS: 0 days        Enrrique Mierzwa Gerome Apley, MD

## 2018-11-02 NOTE — Consult Note (Signed)
Consultation  Referring Provider: TRH/ Arrien MD Primary Care Physician:  Kathrene Alu, MD Primary Gastroenterologist:  Dr. Rush Landmark  Reason for Consultation: Profound anemia/gastric cancer  HPI: Leonard [name] is a 68 y.o. male, who was admitted through the emergency room last night with profound anemia and hemoglobin of 4.7.  He has history of congestive heart failure, coronary artery disease, chronic kidney disease, and peripheral arterial disease status post left AKA.  Also with COPD, diabetes, MGUS. He has a known gastric cancer for which he underwent radiation therapy and was deemed not to be a candidate for chemotherapy or surgical resection.  He had initial endoscopy in February 2020 with finding of 1 nonbleeding gastric ulcer and congested erythematous friable and granular mucosa in the prepyloric region pylorus and stomach.  Biopsies were taken and were consistent with adenocarcinoma. Patient has required multiple blood transfusions over the past 7 months.  He was seen in the office on 09/16/2018 by Dr. Rush Landmark ,and plans were made for repeat EGD, and colonoscopy, which were to be done in early October. His baseline hemoglobin has been in the 8 range.  Hemoglobin on 10/14/2018 was 7.7 and he was transfused 1 unit. Labs were repeated yesterday and hemoglobin noted to be 4.5/hematocrit 14.8, and platelets of 87.  He was admitted and is being transfused a total of 3 units of packed RBCs. Patient had reported very dark or black stool on admission but has also been on oral iron. He is a poor historian currently, denies any problems with nausea vomiting or abdominal pain.    Past Medical History:  Diagnosis Date  . Acute on chronic diastolic heart failure ([place]) 07/04/2014  . Anemia of renal disease   . Atherosclerotic peripheral vascular disease with gangrene ([place]) 02/14/2017  . CAD (coronary artery disease) 04/06/2013  . Cancer ([place])    skin cancer-  . CHF (congestive heart  failure) ([place])   . Chronic anemia 02/28/2016  . Chronic deep vein thrombosis (DVT) of femoral vein of left lower extremity ([place]) 09/03/2016  . Chronic respiratory failure with hypoxia ([place]) 08/25/2017  . CKD (chronic kidney disease) stage 4, GFR 15-29 ml/min (HCC) 01/15/2015   Follows with [facility].  Last visits:  07/23/16: No change in meds. Worried that if we increase diuretics could lead to another AKI  . CKD (chronic kidney disease), stage IV ([place])    07/07/2017  . Claudication ([place])   . Collapse of left talus 01/14/2017  . COPD GOLD III  02/16/2014   Quit smoking 2014  - PFT's  04/06/13   FEV1 1.28 (44 % ) ratio 48  p no % improvement from saba p ?  prior to study with DLCO  43/54 % corrects to 97  % for alv volume - 01/31/2016  After extensive coaching HFA effectiveness =    75% from baseline 0 > changed to sprivia respimat   - PFT's  03/17/2016  FEV1 2.25  (81 % ) ratio 88  p 2 % improvement from saba p spiriva respimat x 2  prior to study with  . Coronary artery disease   . Diabetes ([place])   . Diabetic polyneuropathy associated with type 2 diabetes mellitus ([place]) 01/14/2017  . Diabetic retinopathy ([place]) 11/13/2015   Per Dr. Erenest Rasher eye exam in 10/17/15, mild background diabetic retinopathy  . Fatigue associated with anemia 01/21/2016  . Gangrene of toe ([place])    right 5th/notes 01/04/2013   . HCAP (healthcare-associated pneumonia)   .  Hematuria 06/27/2015  . High cholesterol   . History of tobacco use 08/23/2013   Smoked pack per day for 46 years Quit smoking - 02/03/2013   . Hypertension   . Hypothyroidism 11/22/2014  . Idiopathic chronic gout of left foot without tophus   . Iron deficiency anemia 08/03/2015  . Left foot pain 10/29/2016  . Long term (current) use of anticoagulants [Z79.01] 09/09/2016  . MGUS (monoclonal gammopathy of unknown significance) 01/15/2015  . Mixed hyperlipidemia 07/17/2008  . Morbid obesity due to excess calories ([place]) 03/17/2016  . PAD (peripheral artery disease)  ([place])   . Pleural effusion   . Pneumonitis 07/26/2017  . Pulmonary artery hypertension ([place]) 07/25/2017  . PVD - hx of Rt SFA PTA and s/p Rt 4-5th toe amp Dec 2014 04/05/2013  . 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 Respiratory failure ([place]) 08/13/2017  . Respiratory failure with hypoxia ([place]) 09/23/2017  . 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 Secondary hypertension   . Shortness of breath dyspnea    with exertion  . Skin lesion of face 02/22/2015  . SOB (shortness of breath)   . Type 2 diabetes mellitus with pressure callus ([place]) 12/29/2016  . Type II diabetes mellitus (HCC)    Type II  . Type II diabetes mellitus with complication [facility])     Past Surgical History:  Procedure Laterality Date  . ABDOMINAL AORTOGRAM W/LOWER EXTREMITY N/A 03/12/2017   Procedure: ABDOMINAL AORTOGRAM W/LOWER EXTREMITY;  Surgeon: Conrad Laurel Hollow, MD;  Location: [place] CV LAB;  Service: Cardiovascular;  Laterality: N/A;  . AMPUTATION Right 01/28/2013   Procedure: RAY AMPUTATION RIGHT  4th & 5th TOE;  Surgeon: Rosetta Posner, MD;  Location: [place];  Service: Vascular;  Laterality: Right;  . AMPUTATION Left 03/15/2017   Procedure: AMPUTATION ABOVE KNEE;  Surgeon: Waynetta Sandy, MD;  Location: [place];  Service: Vascular;  Laterality: Left;  . ANGIOPLASTY / STENTING FEMORAL Right 01/13/2013   superficial femoral artery x 2 (3 mm x 60 mm, 4 mm x 60 mm)  . AV FISTULA PLACEMENT Left 02/11/2016   Procedure: LEFT UPPER ARM BRACHIAL CEPHALIC ARTERIOVENOUS (AV) FISTULA CREATION;  Surgeon: Conrad Irondale, MD;  Location: [place];  Service: Vascular;  Laterality: Left;  . BIOPSY  03/29/2018   Procedure: BIOPSY;  Surgeon: Rush Landmark Telford Nab., MD;  Location: [place];  Service: Gastroenterology;;  . BIOPSY  05/31/2018   Procedure: BIOPSY;  Surgeon: Irving Copas., MD;  Location: [place];  Service: Gastroenterology;;  . COLONOSCOPY W/ POLYPECTOMY    . CORONARY ARTERY BYPASS GRAFT N/A 04/07/2013   Procedure: CORONARY  ARTERY BYPASS GRAFTING (CABG);  Surgeon: Ivin Poot, MD;  Location: [place];  Service: Open Heart Surgery;  Laterality: N/A;  . ESOPHAGOGASTRODUODENOSCOPY (EGD) WITH PROPOFOL N/A 03/29/2018   Procedure: ESOPHAGOGASTRODUODENOSCOPY (EGD) WITH PROPOFOL;  Surgeon: Rush Landmark Telford Nab., MD;  Location: [place];  Service: Gastroenterology;  Laterality: N/A;  . ESOPHAGOGASTRODUODENOSCOPY (EGD) WITH PROPOFOL N/A 05/31/2018   Procedure: ESOPHAGOGASTRODUODENOSCOPY (EGD) WITH PROPOFOL;  Surgeon: Rush Landmark Telford Nab., MD;  Location: [place];  Service: Gastroenterology;  Laterality: N/A;  . EUS N/A 05/31/2018   Procedure: UPPER ENDOSCOPIC ULTRASOUND (EUS) RADIAL;  Surgeon: Rush Landmark Telford Nab., MD;  Location: [place];  Service: Gastroenterology;  Laterality: N/A;  . INTRAOPERATIVE TRANSESOPHAGEAL ECHOCARDIOGRAM N/A 04/07/2013   Procedure: INTRAOPERATIVE TRANSESOPHAGEAL ECHOCARDIOGRAM;  Surgeon: Ivin Poot, MD;  Location: [place];  Service: Open Heart Surgery;  Laterality: N/A;  . IR FLUORO GUIDE  CV LINE RIGHT  11/04/2017  . IR REMOVAL TUN CV CATH W/O FL  11/27/2017  . IR US GUIDE VASC ACCESS RIGHT  11/04/2017  . LEFT HEART CATHETERIZATION WITH CORONARY ANGIOGRAM N/A 04/05/2013   Procedure: LEFT HEART CATHETERIZATION WITH CORONARY ANGIOGRAM;  Surgeon: Peter M Martinique, MD;  Location[facility] CATH LAB;  Service: Cardiovascular;  Laterality: N/A;  . LOWER EXTREMITY ANGIOGRAM Bilateral 01/06/2013   Procedure: LOWER EXTREMITY ANGIOGRAM;  Surgeon: Conrad Nome, MD;  Location: [facility] CATH LAB;  Service: Cardiovascular;  Laterality: Bilateral;  . LOWER EXTREMITY ANGIOGRAM Right 01/13/2013   Procedure: LOWER EXTREMITY ANGIOGRAM;  Surgeon: Conrad Rushford, MD;  Location: [facility] CATH LAB;  Service: Cardiovascular;  Laterality: Right;  . SKIN CANCER EXCISION  2017  . THROAT SURGERY  1983   polyps  . TONSILLECTOMY AND ADENOIDECTOMY  ~ 1965    Prior to Admission medications   Medication Sig Start Date End Date  Taking? Authorizing Provider  acetaminophen (TYLENOL) 325 MG tablet Take 650 mg by mouth every 6 (six) hours as needed for fever.   Yes [provider]  amLODipine (NORVASC) 5 MG tablet Take 1 tablet (5 mg total) by mouth daily. 07/12/18  Yes Tammi Klippel, Sherin, DO  ascorbic acid (VITAMIN C) 500 MG tablet Take 500 mg by mouth daily.   Yes [provider]  atorvastatin (LIPITOR) 40 MG tablet TAKE ONE (1) TABLET BY MOUTH EVERY DAY AT 6:00PM Patient taking differently: Take 40 mg by mouth daily at 6 PM.  11/17/16  Yes Larey Dresser, MD  carvedilol (COREG) 12.5 MG tablet Take 1 tablet (12.5 mg total) by mouth 2 (two) times daily with a meal. 08/30/17  Yes Meccariello, Bernita Raisin, DO  cholecalciferol (VITAMIN D) 25 MCG (1000 UT) tablet Take 2,000 Units by mouth 3 (three) times daily.   Yes [provider]  cloNIDine (CATAPRES - DOSED IN MG/24 HR) 0.2 mg/24hr patch Place 1 patch (0.2 mg total) onto the skin once a week. 08/09/17  Yes Steve Rattler, DO  Darbepoetin Alfa (ARANESP) 40 MCG/0.4ML SOSY injection Inject 0.4 mLs (40 mcg total) into the skin every 30 (thirty) days. 09/28/17  Yes Meccariello, Bernita Raisin, DO  doxazosin (CARDURA) 8 MG tablet Take 1 tablet (8 mg total) by mouth every evening. Patient taking differently: Take 8 mg by mouth daily.  12/01/16  Yes Hensel, Jamal Collin, MD  feeding supplement, ENSURE ENLIVE, (ENSURE ENLIVE) LIQD Take 237 mLs by mouth 3 (three) times daily between meals. 08/07/17  Yes Riccio, Angela C, DO  ferrous sulfate (FEROSUL) 325 (65 FE) MG tablet Take 1 tablet (325 mg total) by mouth daily at 12 noon. Patient taking differently: Take 325 mg by mouth daily.  08/31/17  Yes Meccariello, Bernita Raisin, DO  Glycopyrrolate (LONHALA MAGNAIR STARTER KIT) 25 MCG/ML SOLN Inhale 1 mL into the lungs 2 (two) times a day.   Yes [provider]  hydrALAZINE (APRESOLINE) 100 MG tablet TAKE ONE TABLET BY MOUTH EVERY EIGHT HOURS Patient taking differently: Take 100  mg by mouth every 8 (eight) hours.  11/14/16  Yes [name], MD  insulin aspart (NOVOLOG) 100 UNIT/ML injection Inject 0-10 Units into the skin 3 (three) times daily before meals. <150= 0 units, 151-200= 2 units, 201-250= 4 units, 251-300= 6 units, 301-350= 8 units, 351-400= 10 units   Yes [provider]  isosorbide mononitrate (IMDUR) 120 MG 24 hr tablet Take 1 tablet (120 mg total) by mouth daily. 10/02/17  Yes Ouida Sills,  Chelsey L, DO  levothyroxine (SYNTHROID, LEVOTHROID) 100 MCG tablet Take 100 mcg by mouth daily before breakfast.    Yes [provider]  nitroGLYCERIN (NITROSTAT) 0.4 MG SL tablet Place 1 tablet (0.4 mg total) under the tongue every 5 (five) minutes as needed for chest pain. Patient taking differently: Place 0.4 mg under the tongue every 5 (five) minutes as needed for chest pain. Chest pain 11/14/16  Yes [name], MD  ondansetron (ZOFRAN) 4 MG tablet Take 4 mg by mouth every 8 (eight) hours as needed for nausea or vomiting.   Yes [provider]  pantoprazole (PROTONIX) 40 MG tablet Take 1 tablet (40 mg total) by mouth 2 (two) times daily. 04/03/18  Yes Beard, Aldona Bar N, DO  polyethylene glycol (MIRALAX / GLYCOLAX) packet Take 17 g by mouth daily as needed for mild constipation or moderate constipation. 08/07/17  Yes Riccio, Angela C, DO  torsemide (DEMADEX) 20 MG tablet Take 2 tablets (40 mg total) by mouth daily. 02/16/18  Yes Larey Dresser, MD  umeclidinium bromide (INCRUSE ELLIPTA) 62.5 MCG/INH AEPB Inhale 1 puff into the lungs daily. 06/28/18  Yes Mullis, Kiersten P, DO  Blood Glucose Monitoring Suppl (ONETOUCH VERIO FLEX SYSTEM) w/Device KIT 1 kit by Does not apply route daily. 10/10/16   McDiarmid, Blane Ohara, MD  glucose blood (ONETOUCH VERIO) test strip Use as instructed to test three times daily. ICD-10 code: E11.22 10/15/16   McDiarmid, Blane Ohara, MD  [facility] DELICA LANCETS 70J MISC 1 each by Does not apply route 3 (three) times  daily. ICD-10 code: E11.22 Patient not taking: Reported on 07/15/2018 10/15/16   McDiarmid, Blane Ohara, MD    Current Facility-Administered Medications  Medication Dose Route Frequency Provider Last Rate Last Dose  . [MAR Hold] 0.9 %  sodium chloride infusion (Manually program via Guardrails IV Fluids)   Intravenous Once Lucrezia Starch, MD   Stopped at 11/01/18 1750  . [MAR Hold] 0.9 %  sodium chloride infusion (Manually program via Guardrails IV Fluids)   Intravenous Once Schorr, Rhetta Mura, NP      . [name] Sou Hold] 0.9 %  sodium chloride infusion  250 mL Intravenous PRN Jani Gravel, MD      . [name] Sou Hold] acetaminophen (TYLENOL) tablet 650 mg  650 mg Oral Q6H PRN Jani Gravel, MD   650 mg at 11/02/18 6283   Or  . [MAR Hold] acetaminophen (TYLENOL) suppository 650 mg  650 mg Rectal Q6H PRN Jani Gravel, MD      . [name] Sou Hold] amLODipine (NORVASC) tablet 5 mg  5 mg Oral Daily Jani Gravel, MD   5 mg at 11/02/18 0908  . [MAR Hold] atorvastatin (LIPITOR) tablet 40 mg  40 mg Oral q1800 Jani Gravel, MD      . [name] Sou Hold] carvedilol (COREG) tablet 12.5 mg  12.5 mg Oral BID WC Jani Gravel, MD   12.5 mg at 11/02/18 0907  . [MAR Hold] chlorhexidine (PERIDEX) 0.12 % solution 15 mL  15 mL Mouth Rinse BID Arrien, Jimmy Picket, MD   15 mL at 11/02/18 1227  . [MAR Hold] Chlorhexidine Gluconate Cloth 2 % PADS 6 each  6 each Topical Daily Jani Gravel, MD   6 each at 11/01/18 2149  . [MAR Hold] cloNIDine (CATAPRES - Dosed in mg/24 hr) patch 0.2 mg  0.2 mg Transdermal Weekly Jani Gravel, MD      . [name] Sou Hold] doxazosin (CARDURA) tablet 8 mg  8 mg Oral Daily Jani Gravel, MD  8 mg at 11/02/18 1029  . [MAR Hold] ferrous sulfate tablet 325 mg  325 mg Oral Daily Jani Gravel, MD   325 mg at 11/02/18 0905  . [MAR Hold] hydrALAZINE (APRESOLINE) tablet 100 mg  100 mg Oral Q8H Jani Gravel, MD   Stopped at 11/02/18 1220  . [MAR Hold] insulin aspart (novoLOG) injection 0-9 Units  0-9 Units Subcutaneous Q4H Jani Gravel, MD   1 Units at 11/02/18  1227  . [MAR Hold] isosorbide mononitrate (IMDUR) 24 hr tablet 120 mg  120 mg Oral Daily Jani Gravel, MD   120 mg at 11/02/18 0906  . [MAR Hold] levothyroxine (SYNTHROID) tablet 100 mcg  100 mcg Oral QAC breakfast Jani Gravel, MD   100 mcg at 11/02/18 0541  . [facility] Hold] MEDLINE mouth rinse  15 mL Mouth Rinse q12n4p Arrien, Jimmy Picket, MD      . [name] Sou Hold] ondansetron [facility]) tablet 4 mg  4 mg Oral Q8H PRN Jani Gravel, MD      . [name] Sou Hold] pantoprazole (PROTONIX) injection 40 mg  40 mg Intravenous Q12H Arrien, Jimmy Picket, MD   40 mg at 11/02/18 1029  . [MAR Hold] polyethylene glycol (MIRALAX / GLYCOLAX) packet 17 g  17 g Oral Daily PRN Jani Gravel, MD      . [name] Sou Hold] sodium chloride flush (NS) 0.9 % injection 3 mL  3 mL Intravenous PRN Jani Gravel, MD      . [name] Sou Hold] torsemide [facility]) tablet 40 mg  40 mg Oral Daily Arrien, Jimmy Picket, MD       Facility-Administered Medications Ordered in Other Encounters  Medication Dose Route Frequency Provider Last Rate Last Dose  . 0.9 %  sodium chloride infusion (Manually program via Guardrails IV Fluids)  250 mL Intravenous Once Hayden Pedro, PA-C      . sodium chloride flush (NS) 0.9 % injection 10 mL  10 mL Intracatheter PRN Heath Lark, MD        Allergies as of 11/01/2018 - Review Complete 11/01/2018  Allergen Reaction Noted  . Penicillins Other (See Comments) 08/13/2017    Family History  Problem Relation Age of Onset  . Cancer Mother        lung cancer  . Hypertension Mother   . Cancer Sister        patient thinks it was uterine cancer  . Hypertension Sister   . Heart attack Sister   . Stroke Neg Hx   . Colon cancer Neg Hx   . Esophageal cancer Neg Hx   . Inflammatory bowel disease Neg Hx   . Pancreatic cancer Neg Hx   . Liver disease Neg Hx   . Rectal cancer Neg Hx   . Stomach cancer Neg Hx     Social History   Socioeconomic History  . Marital status: Single    Spouse name: Not on file  . Number of  children: 0  . Years of education: Not on file  . Highest education level: Not on file  Occupational History  . Occupation: disabled  Social Needs  . Financial resource strain: Not on file  . Food insecurity    Worry: Not on file    Inability: Not on file  . Transportation needs    Medical: No    Non-medical: No  Tobacco Use  . Smoking status: Current Every Day Smoker    Packs/day: 1.00    Years: 46.00    Pack years: 46.00    Types: Cigarettes  Start date: 02/04/1967  . Smokeless tobacco: Never Used  Substance and Sexual Activity  . Alcohol use: No    Alcohol/week: 0.0 standard drinks  . Drug use: No  . Sexual activity: Not Currently  Lifestyle  . Physical activity    Days per week: Not on file    Minutes per session: Not on file  . Stress: Not on file  Relationships  . Social Herbalist on phone: Patient refused    Gets together: Patient refused    Attends religious service: Patient refused    Active member of club or organization: Patient refused    Attends meetings of clubs or organizations: Patient refused    Relationship status: Patient refused  . Intimate partner violence    Fear of current or ex partner: No    Emotionally abused: No    Physically abused: No    Forced sexual activity: No  Other Topics Concern  . Not on file  Social History Narrative   Retired - former Sports coach.   Patient in skilled nursing facility.    Review of Systems: Patient unable to offer any extensive review of systems currently  Physical Exam: Vital signs in last 24 hours: Temp:  [98.4 F (36.9 C)-99.6 F (37.6 C)] 98.4 F (36.9 C) (09/29 1248) Pulse Rate:  [68-78] 69 (09/29 1248) Resp:  [9-22] 12 (09/29 1248) BP: (138-209)/(44-78) 187/63 (09/29 1248) SpO2:  [93 %-100 %] 96 % (09/29 1248) Weight:  [81 kg-81.2 kg] 81 kg (09/29 1248)   General:   Alert,  Well-developed, well-nourished, older African-American male, hard of hearing and sleepy Head:   Normocephalic and atraumatic. Eyes:  Sclera clear, no icterus.   Conjunctiva pale Ears:  Normal auditory acuity. Nose:  No deformity, discharge,  or lesions. Mouth:  No deformity or lesions.   Neck:  Supple; no masses or thyromegaly. Lungs:  Clear throughout to auscultation.   No wheezes, crackles, or rhonchi.  Heart:  Regular rate and rhythm; no murmurs, clicks, rubs,  or gallops. Abdomen:  Soft,nontender, BS active,nonpalp mass or hsm.   Rectal:  Deferred  Msk: Status post left AKA. Pulses:  Normal pulses noted. Extremities: 1+ edema right lower extremity Neurologic:  Alert and  oriented x3, hard of hearing and somnolent Skin:  Intact without significant lesions or rashes.. Psych:  Alert and cooperative.  Intake/Output from previous day: 09/28 0701 - 09/29 0700 In: 997.6 [P.O.:240; I.V.:130.6; Blood:627] Out: 950 [Urine:950] Intake/Output this shift: Total I/O In: 153.4 [I.V.:123.4; Blood:30] Out: -   Lab Results: Recent Labs    11/01/18 1613 11/02/18 0531  WBC 4.1 5.3  HGB 4.5* 6.5*  HCT 14.8* 20.2*  PLT 87* 88*   BMET Recent Labs    11/01/18 1613 11/02/18 0531  NA 143 144  K 3.2* 3.0*  CL 112* 113*  CO2 19* 21*  GLUCOSE 242* 135*  BUN 49* 48*  CREATININE 2.85* 2.69*  CALCIUM 8.1* 8.0*   LFT Recent Labs    11/02/18 0531  PROT 5.4*  ALBUMIN 3.0*  AST 14*  ALT 11  ALKPHOS 101  BILITOT 1.2   PT/INR No results for input(s): LABPROT, INR in the last 72 hours. Hepatitis Panel No results for input(s): HEPBSAG, HCVAB, HEPAIGM, HEPBIGM in the last 72 hours.   IMPRESSION:  #13 68 year old African-American male with gastric adenocarcinoma diagnosed February 2020 and completed a course of radiation therapy.  Patient was deemed not to be a surgical candidate, or candidate for chemotherapy  due to multiple comorbidities. He has had ongoing problems with symptomatic anemia, iron deficiency and requirement for intermittent blood transfusions. Admitted yesterday  with hemoglobin of 4.5  Patient has had persistent dark stools, but no active GI hemorrhage.  #2 chronic kidney disease stage IV #3 adult onset diabetes mellitus, with polyneuropathy #4 peripheral arterial disease status post left AKA #5 hypertension #6 coronary artery disease  PLAN: Patient has been scheduled for EGD with Dr. Hilarie Fredrickson this afternoon to reassess gastric cancer.  Colonoscopy had previously been entertained but do not feel that this will alter his management, in setting of known gastric adenocarcinoma.  Patient was started on PPI infusion on admission, this could be discontinued, and start twice daily PPI  Transfuse to keep hemoglobin in 7-8 range We will check iron studies, and consider IV Feraheme while inpatient  Further recommendations pending findings at EGD.   Caedan Sumler EsterwoodPA-C  11/02/2018, 12:52 PM

## 2018-11-02 NOTE — Anesthesia Postprocedure Evaluation (Signed)
Anesthesia Post Note  Patient: Leonard [name]  Procedure(s) Performed: ESOPHAGOGASTRODUODENOSCOPY (EGD) WITH PROPOFOL (N/A )     Patient location during evaluation: Endoscopy Anesthesia Type: MAC Level of consciousness: awake Pain management: pain level controlled Vital Signs Assessment: post-procedure vital signs reviewed and stable Respiratory status: spontaneous breathing Cardiovascular status: stable Postop Assessment: no apparent nausea or vomiting Anesthetic complications: no    Last Vitals:  Vitals:   11/02/18 1400 11/02/18 1410  BP: (!) 150/45 (!) 141/42  Pulse: 61 60  Resp: 20 16  Temp:    SpO2: 100% 100%    Last Pain:  Vitals:   11/02/18 1324  TempSrc: Oral  PainSc:    Pain Goal:                   Huston Foley

## 2018-11-02 NOTE — Anesthesia Preprocedure Evaluation (Signed)
Anesthesia Evaluation  Patient identified by MRN, date of birth, ID band Patient awake    Reviewed: Allergy & Precautions, NPO status , Patient's Chart, lab work & pertinent test results  Airway Mallampati: I  TM Distance: >3 FB Neck ROM: Full    Dental  (+) Edentulous Upper, Edentulous Lower   Pulmonary COPD,  COPD inhaler, Current Smoker and Patient abstained from smoking., former smoker,    Pulmonary exam normal        Cardiovascular hypertension, Pt. on medications and Pt. on home beta blockers + CAD, + CABG, + Peripheral Vascular Disease and +CHF  Normal cardiovascular exam Rhythm:Regular Rate:Normal     Neuro/Psych negative neurological ROS  negative psych ROS   GI/Hepatic GERD  Medicated,  Endo/Other  diabetes, Type 2, Insulin DependentHypothyroidism   Renal/GU Renal diseaseCKD IV     Musculoskeletal  (+) Arthritis ,   Abdominal Normal abdominal exam  (+)   Peds  Hematology  (+) Blood dyscrasia, anemia ,   Anesthesia Other Findings   Reproductive/Obstetrics                             Lab Results  Component Value Date   WBC 5.3 11/02/2018   HGB 6.5 (LL) 11/02/2018   HCT 20.2 (L) 11/02/2018   MCV 95.7 11/02/2018   PLT 88 (L) 11/02/2018    Lab Results  Component Value Date   CREATININE 2.69 (H) 11/02/2018   BUN 48 (H) 11/02/2018   NA 144 11/02/2018   K 3.0 (L) 11/02/2018   CL 113 (H) 11/02/2018   CO2 21 (L) 11/02/2018   Lab Results  Component Value Date   INR 1.1 06/26/2018   INR 1.1 06/24/2018   INR 1.1 06/23/2018   Echo: - Left ventricle: Abnormal global LV strain. The cavity size was   normal. Wall thickness was increased in a pattern of moderate to   severe LVH. Systolic function was normal. The estimated ejection   fraction was in the range of 50% to 55%. Wall motion was normal;   there were no regional wall motion abnormalities. - Aortic root: The aortic root  was mildly dilated. - Mitral valve: Mildly calcified annulus. There was mild   regurgitation. - Left atrium: The atrium was moderately dilated.  Anesthesia Physical  Anesthesia Plan  ASA: III  Anesthesia Plan: MAC   Post-op Pain Management:    Induction:   PONV Risk Score and Plan: Ondansetron, Propofol infusion and Treatment may vary due to age or medical condition  Airway Management Planned: Nasal Cannula, Natural Airway and Mask  Additional Equipment: None  Intra-op Plan:   Post-operative Plan:   Informed Consent: I have reviewed the patients History and Physical, chart, labs and discussed the procedure including the risks, benefits and alternatives for the proposed anesthesia with the patient or authorized representative who has indicated his/her understanding and acceptance.     Dental advisory given  Plan Discussed with: CRNA  Anesthesia Plan Comments:         Anesthesia Quick Evaluation

## 2018-11-02 NOTE — Transfer of Care (Signed)
Immediate Anesthesia Transfer of Care Note  Patient: Leonard [name]  Procedure(s) Performed: ESOPHAGOGASTRODUODENOSCOPY (EGD) WITH PROPOFOL (N/A )  Patient Location: Endoscopy Unit  Anesthesia Type:MAC  Level of Consciousness: awake, alert , oriented and patient cooperative  Airway & Oxygen Therapy: Patient Spontanous Breathing and Patient connected to face mask oxygen  Post-op Assessment: Report given to RN, Post -op Vital signs reviewed and stable and Patient moving all extremities  Post vital signs: Reviewed and stable  Last Vitals:  Vitals Value Taken Time  BP    Temp    Pulse    Resp    SpO2      Last Pain:  Vitals:   11/02/18 1324  TempSrc: Oral  PainSc:          Complications: No apparent anesthesia complications

## 2018-11-02 NOTE — Op Note (Signed)
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 Hilarie Fredrickson, MD, Josie Dixon, RN, Grace Isaac, RN, William Dalton, Technician Referring MD:             [facility] Medicines:                Monitored Anesthesia Care Complications:            No immediate complications. Estimated Blood Loss:     Estimated blood loss: none. Procedure:                Pre-Anesthesia Assessment:                           - Prior to the procedure, a History and Physical                            was performed, and patient medications and                            allergies were reviewed. The patient's tolerance of                            previous anesthesia was also reviewed. The risks                            and benefits of the procedure and the sedation                            options and risks were discussed with the patient.                            All questions were answered, and informed consent                            was obtained. Prior Anticoagulants: The patient has  taken no previous anticoagulant or antiplatelet                            agents. ASA Grade Assessment: III - A patient with                            severe systemic disease. After reviewing the risks                            and benefits, the patient was deemed in                            satisfactory condition to undergo the procedure.  After obtaining informed consent, the endoscope was                            passed under direct vision. Throughout the                            procedure, the patient's blood pressure, pulse, and                            oxygen saturations were monitored continuously. The                            GIF-H190 ([id number]) Olympus gastroscope was                            introduced through the mouth, and advanced to the                            second part of duodenum. The upper GI endoscopy was                            accomplished without difficulty. The patient                            tolerated the procedure well. Scope In: Scope Out: Findings:      The examined esophagus was normal.      A medium-sized, infiltrative and ulcerated, circumferential mass with       active oozing bleeding was found in the distal gastric antrum, in the       prepyloric region of the stomach and at the pylorus. This mass is not       obstructing. There was adherent clot which was cleared away with       irrigation and lavage. The tumor is active bleeding from multiple sites       none of which were amenable to endoscopic therapy. No endoscopic therapy       available would provide long-lasting hemostasis given the malignant       nature of the pathology. There tumor was ulcerated in the pyloric       channel but the standard adult upper endoscope traversed this area into       the duodenal bulb without difficulty.      The examined duodenum showed  melanosis but was otherwise normal. No       evidence of bleeding or tumor in the examined duodenum. Impression:               - Normal esophagus.                           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 This is the                            source of chronic GI bleeding and recurrent anemia.                            - Normal examined duodenum.                           - No specimens collected. Moderate Sedation:      N/A Recommendation:           - Return patient to hospital ward for ongoing care.                           - BID PPI.                           - No NSAIDs.                           - Carafate may help some.                           - Will need close monitoring of blood counts and                            red cell transfusion to support Hgb when needed.                           - We will check to see if he needs IV iron.                           - Will ask IR if GDA embolization would offer any                            benefit against bleeding. 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 All rights reserved. The codes documented in this report are preliminary and upon coder review may  be revised to meet current compliance requirements. Jerene Bears, MD 11/02/2018 2:15:00 PM This report has been signed electronically. Number of Addenda: 0

## 2018-11-02 NOTE — Progress Notes (Signed)
CRITICAL VALUE STICKER  CRITICAL VALUE: HGB 6.5   RECEIVER Theodoro Doing   DATE & TIME NOTIFIED:   MESSENGER   MD NOTIFIED: Raliegh Ip Schorr   TIME OF NOTIFICATION: 0630   RESPONSE: awaiting orders

## 2018-11-03 ENCOUNTER — Other Ambulatory Visit: Payer: Self-pay

## 2018-11-03 ENCOUNTER — Inpatient Hospital Stay (HOSPITAL_COMMUNITY): Admission: RE | Admit: 2018-11-03 | Payer: Medicare Other | Source: Ambulatory Visit

## 2018-11-03 ENCOUNTER — Encounter (HOSPITAL_COMMUNITY): Payer: Self-pay | Admitting: Internal Medicine

## 2018-11-03 ENCOUNTER — Inpatient Hospital Stay (HOSPITAL_COMMUNITY): Payer: Medicare Other

## 2018-11-03 DIAGNOSIS — I2510 Atherosclerotic heart disease of native coronary artery without angina pectoris: Secondary | ICD-10-CM

## 2018-11-03 DIAGNOSIS — C163 Malignant neoplasm of pyloric antrum: Principal | ICD-10-CM

## 2018-11-03 LAB — CBC WITH DIFFERENTIAL/PLATELET
Abs Immature Granulocytes: 0.05 10*3/uL (ref 0.00–0.07)
Basophils Absolute: 0 10*3/uL (ref 0.0–0.1)
Basophils Relative: 1 %
Eosinophils Absolute: 0.9 10*3/uL — ABNORMAL HIGH (ref 0.0–0.5)
Eosinophils Relative: 15 %
HCT: 23.4 % — ABNORMAL LOW (ref 39.0–52.0)
Hemoglobin: 7.3 g/dL — ABNORMAL LOW (ref 13.0–17.0)
Immature Granulocytes: 1 %
Lymphocytes Relative: 8 %
Lymphs Abs: 0.5 10*3/uL — ABNORMAL LOW (ref 0.7–4.0)
MCH: 29.7 pg (ref 26.0–34.0)
MCHC: 31.2 g/dL (ref 30.0–36.0)
MCV: 95.1 fL (ref 80.0–100.0)
Monocytes Absolute: 0.4 10*3/uL (ref 0.1–1.0)
Monocytes Relative: 7 %
Neutro Abs: 4.1 10*3/uL (ref 1.7–7.7)
Neutrophils Relative %: 68 %
Platelets: 86 10*3/uL — ABNORMAL LOW (ref 150–400)
RBC: 2.46 MIL/uL — ABNORMAL LOW (ref 4.22–5.81)
RDW: 18.1 % — ABNORMAL HIGH (ref 11.5–15.5)
WBC: 5.9 10*3/uL (ref 4.0–10.5)
nRBC: 0 % (ref 0.0–0.2)

## 2018-11-03 LAB — IRON AND TIBC
Iron: 46 ug/dL (ref 45–182)
Saturation Ratios: 20 % (ref 17.9–39.5)
TIBC: 234 ug/dL — ABNORMAL LOW (ref 250–450)
UIBC: 188 ug/dL

## 2018-11-03 LAB — BASIC METABOLIC PANEL
Anion gap: 7 (ref 5–15)
BUN: 44 mg/dL — ABNORMAL HIGH (ref 8–23)
CO2: 21 mmol/L — ABNORMAL LOW (ref 22–32)
Calcium: 7.8 mg/dL — ABNORMAL LOW (ref 8.9–10.3)
Chloride: 114 mmol/L — ABNORMAL HIGH (ref 98–111)
Creatinine, Ser: 2.92 mg/dL — ABNORMAL HIGH (ref 0.61–1.24)
GFR calc Af Amer: 25 mL/min — ABNORMAL LOW (ref >60)
GFR calc non Af Amer: 21 mL/min — ABNORMAL LOW (ref >60)
Glucose, Bld: 115 mg/dL — ABNORMAL HIGH (ref 70–99)
Potassium: 3 mmol/L — ABNORMAL LOW (ref 3.5–5.1)
Sodium: 142 mmol/L (ref 135–145)

## 2018-11-03 LAB — GLUCOSE, CAPILLARY
Glucose-Capillary: 122 mg/dL — ABNORMAL HIGH (ref 70–99)
Glucose-Capillary: 111 mg/dL — ABNORMAL HIGH (ref 70–99)
Glucose-Capillary: 147 mg/dL — ABNORMAL HIGH (ref 70–99)
Glucose-Capillary: 159 mg/dL — ABNORMAL HIGH (ref 70–99)
Glucose-Capillary: 200 mg/dL — ABNORMAL HIGH (ref 70–99)

## 2018-11-03 LAB — VITAMIN B12: Vitamin B-12: 263 pg/mL (ref 180–914)

## 2018-11-03 LAB — FERRITIN: Ferritin: 1065 ng/mL — ABNORMAL HIGH (ref 24–336)

## 2018-11-03 MED ORDER — POTASSIUM CHLORIDE CRYS ER 20 MEQ PO TBCR
40.0000 meq | EXTENDED_RELEASE_TABLET | Freq: Once | ORAL | Status: AC
  Administered 2018-11-03: 13:00:00 40 meq via ORAL
  Filled 2018-11-03: qty 2

## 2018-11-03 MED ORDER — IOHEXOL 300 MG/ML  SOLN: 30.0000 mL | Freq: Once | INTRAMUSCULAR | Status: DC | PRN

## 2018-11-03 MED ORDER — POTASSIUM CHLORIDE 10 MEQ/100ML IV SOLN
10.0000 meq | INTRAVENOUS | Status: AC
  Administered 2018-11-03 (×4): 10 meq via INTRAVENOUS
  Filled 2018-11-03 (×4): qty 100

## 2018-11-03 NOTE — Consult Note (Signed)
[facility] Surgery Consult Note  [name] [birth year]  [id number].    Requesting MD: Dr. Billie Lade Chief Complaint: Bleeding gastric tumor  Reason for Consult: Surgical evaluation Currently living situation: [place] at [facility] PCP: Kathrene Alu, MD   HPI:  Patient is a 68 year old male with gastric cancer who was undergoing routine follow-up labs.  He was found to have a hemoglobin of 4.7 and was transferred to the emergency department by ambulance.  In the ED he had no acute complaints.  He was unaware of any changes in his bowel habits.  He was seen and admitted by Medicine service to the ICU and transfused.  He was seen by yesterday by gastroenterology, and Dr. Zenovia Leonard [name].  He is scheduled for EGD today, but Dr. Hilarie [name] is concerned he would not be able to provide a durable result for bleeding control.  He was originally seen by our service on 04/01/2018.  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  Because of his multiple medical issues he was found to have a surgical risk of score of 31% for serious complications, and a 7% risk of death.  He was evaluated by Dr. Excell [name].  Note on 04/02/2018 by Dr. Excell [name] shows no evidence of metastatic disease.  He was deemed not a candidate for chemotherapy by Medical Oncology, it was their opinion the patient was at twice the risk for serious complication or death as detailed on the previous note.   He was discharged; then  seen again in the hospital on 06/24/18 with recurrent GI bleed.  Cardiology clearance was obtained and he was to follow-up for surgery as an outpatient with Dr. Barry [name].  He was readmitted on 07/06/18 with GI bleed and plans for radiation Rx.  [name] has a note from 07/07/18 that says he was deemed to high risk for surgery by 2 surgeons.  Note by Dr. Barry [name] from our office 07/02/18, said she wanted to try radiation and if that failed wound  reconsider surgery. He was started on radiation Rx in June, 2020. Plan for radiation Rx: receive 45 Gy in 25 fractions initially. The patient will then receive a 9 Gy boost to yield a final dose of 54 Gy.  He completed radiation therapy on 08/19/2018.    He was readmitted again on 07/15/18, & 07/20/2018 with recurrent GI bleeds/anemia. He was transfused and discharged home again.  Note on 09/16/18 from Dr. Donnie [name] gastroenterology notes progression of kidney disease but patient wanted to continue evaluation for all possible methods of treatment.  He was scheduled for endoscopy again but this was put off due to issues with COVID testing in September.  Work-up on 11/01/18 shows he is afebrile, vital signs are stable, WBC 4.1, hemoglobin 4.5, hematocrit 14.8, platelets 87,000.  Creatinine 2.85, glucose 242.  He has been transfused 3 units of packed cells.  His hemoglobin is now 7.3, hematocrit 23.4, platelets 86,000.  Creatinine 2.92, potassium 3.0. He presents again today with symptomatic anemia.  We are asked to see.     ROS: Review of Systems  Unable to perform ROS: Mental acuity  Constitutional:       Pt feels fine, he just knows they sent him here, he does not feel bad.  No pain or discomfort.  He thinks he is here because of cancer, but not really sure.  He cannot really do a ROS.  He thinks he is fine, doesn't know  if he has blood in the stool.  Doesn't know if he is losing weight.      Family History  Problem Relation Age of Onset  . Cancer Mother        lung cancer  . Hypertension Mother   . Cancer Sister        patient thinks it was uterine cancer  . Hypertension Sister   . Heart attack Sister   . Stroke Neg Hx   . Colon cancer Neg Hx   . Esophageal cancer Neg Hx   . Inflammatory bowel disease Neg Hx   . Pancreatic cancer Neg Hx   . Liver disease Neg Hx   . Rectal cancer Neg Hx   . Stomach cancer Neg Hx     Past Medical History:  Diagnosis Date  . Acute on chronic  diastolic heart failure ([place]) 07/04/2014  . Anemia of renal disease   . Atherosclerotic peripheral vascular disease with gangrene ([place]) 02/14/2017  . CAD (coronary artery disease) 04/06/2013  . Cancer ([place])    skin cancer-  . CHF (congestive heart failure) ([place])   . Chronic anemia 02/28/2016  . Chronic deep vein thrombosis (DVT) of femoral vein of left lower extremity ([place]) 09/03/2016  . Chronic respiratory failure with hypoxia ([place]) 08/25/2017  . CKD (chronic kidney disease) stage 4, GFR 15-29 ml/min (HCC) 01/15/2015   Follows with [facility].  Last visits:  07/23/16: No change in meds. Worried that if we increase diuretics could lead to another AKI  . CKD (chronic kidney disease), stage IV ([place])    07/07/2017  . Claudication ([place])   . Collapse of left talus 01/14/2017  . COPD GOLD III  02/16/2014   Quit smoking 2014  - PFT's  04/06/13   FEV1 1.28 (44 % ) ratio 48  p no % improvement from saba p ?  prior to study with DLCO  43/54 % corrects to 97  % for alv volume - 01/31/2016  After extensive coaching HFA effectiveness =    75% from baseline 0 > changed to sprivia respimat   - PFT's  03/17/2016  FEV1 2.25  (81 % ) ratio 88  p 2 % improvement from saba p spiriva respimat x 2  prior to study with  . Coronary artery disease   . Diabetes ([place])   . Diabetic polyneuropathy associated with type 2 diabetes mellitus ([place]) 01/14/2017  . Diabetic retinopathy ([place]) 11/13/2015   Per Dr. Erenest Rasher eye exam in 10/17/15, mild background diabetic retinopathy  . Fatigue associated with anemia 01/21/2016  . Gangrene of toe ([place])    right 5th/notes 01/04/2013   . HCAP (healthcare-associated pneumonia)   . Hematuria 06/27/2015  . High cholesterol   . History of tobacco use 08/23/2013   Smoked pack per day for 46 years Quit smoking - 02/03/2013   . Hypertension   . Hypothyroidism 11/22/2014  . Idiopathic chronic gout of left foot without tophus   . Iron deficiency anemia 08/03/2015  . Left foot pain 10/29/2016  . Long  term (current) use of anticoagulants [Z79.01] 09/09/2016  . MGUS (monoclonal gammopathy of unknown significance) 01/15/2015  . Mixed hyperlipidemia 07/17/2008  . Morbid obesity due to excess calories ([place]) 03/17/2016  . PAD (peripheral artery disease) ([place])   . Pleural effusion   . Pneumonitis 07/26/2017  . Pulmonary artery hypertension ([name]) 07/25/2017  . PVD - hx of Rt SFA PTA and s/p Rt 4-5th toe amp Dec 2014 04/05/2013  . Resistant hypertension  11/25/2012  . Respiratory failure ([place]) 08/13/2017  . Respiratory failure with hypoxia ([place]) 09/23/2017  . 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 Secondary hypertension   . Shortness of breath dyspnea    with exertion  . Skin lesion of face 02/22/2015  . SOB (shortness of breath)   . Type 2 diabetes mellitus with pressure callus ([place]) 12/29/2016  . Type II diabetes mellitus (HCC)    Type II  . Type II diabetes mellitus with complication [facility])     Past Surgical History:  Procedure Laterality Date  . ABDOMINAL AORTOGRAM W/LOWER EXTREMITY N/A 03/12/2017   Procedure: ABDOMINAL AORTOGRAM W/LOWER EXTREMITY;  Surgeon: Conrad Leonore, MD;  Location: [facility] CV [facility];  Service: Cardiovascular;  Laterality: N/A;  . AMPUTATION Right 01/28/2013   Procedure: RAY AMPUTATION RIGHT  4th & 5th TOE;  Surgeon: Rosetta Posner, MD;  Location: [facility];  Service: Vascular;  Laterality: Right;  . AMPUTATION Left 03/15/2017   Procedure: AMPUTATION ABOVE KNEE;  Surgeon: Waynetta Sandy, MD;  Location: [facility];  Service: Vascular;  Laterality: Left;  . ANGIOPLASTY / STENTING FEMORAL Right 01/13/2013   superficial femoral artery x 2 (3 mm x 60 mm, 4 mm x 60 mm)  . AV FISTULA PLACEMENT Left 02/11/2016   Procedure: LEFT UPPER ARM BRACHIAL CEPHALIC ARTERIOVENOUS (AV) FISTULA CREATION;  Surgeon: Conrad Maxville, MD;  Location: [facility];  Service: Vascular;  Laterality: Left;  . BIOPSY  03/29/2018   Procedure: BIOPSY;  Surgeon: Rush Landmark Telford Nab., MD;  Location: [facility];  Service:  Gastroenterology;;  . BIOPSY  05/31/2018   Procedure: BIOPSY;  Surgeon: Irving Copas., MD;  Location: [facility];  Service: Gastroenterology;;  . COLONOSCOPY W/ POLYPECTOMY    . CORONARY ARTERY BYPASS GRAFT N/A 04/07/2013   Procedure: CORONARY ARTERY BYPASS GRAFTING (CABG);  Surgeon: Ivin Poot, MD;  Location: [facility];  Service: Open Heart Surgery;  Laterality: N/A;  . ESOPHAGOGASTRODUODENOSCOPY (EGD) WITH PROPOFOL N/A 03/29/2018   Procedure: ESOPHAGOGASTRODUODENOSCOPY (EGD) WITH PROPOFOL;  Surgeon: Rush Landmark Telford Nab., MD;  Location: [facility];  Service: Gastroenterology;  Laterality: N/A;  . ESOPHAGOGASTRODUODENOSCOPY (EGD) WITH PROPOFOL N/A 05/31/2018   Procedure: ESOPHAGOGASTRODUODENOSCOPY (EGD) WITH PROPOFOL;  Surgeon: Rush Landmark Telford Nab., MD;  Location: [facility];  Service: Gastroenterology;  Laterality: N/A;  . EUS N/A 05/31/2018   Procedure: UPPER ENDOSCOPIC ULTRASOUND (EUS) RADIAL;  Surgeon: Rush Landmark Telford Nab., MD;  Location: [facility];  Service: Gastroenterology;  Laterality: N/A;  . INTRAOPERATIVE TRANSESOPHAGEAL ECHOCARDIOGRAM N/A 04/07/2013   Procedure: INTRAOPERATIVE TRANSESOPHAGEAL ECHOCARDIOGRAM;  Surgeon: Ivin Poot, MD;  Location: [facility];  Service: Open Heart Surgery;  Laterality: N/A;  . IR FLUORO GUIDE CV LINE RIGHT  11/04/2017  . IR REMOVAL TUN CV CATH W/O FL  11/27/2017  . IR US GUIDE VASC ACCESS RIGHT  11/04/2017  . LEFT HEART CATHETERIZATION WITH CORONARY ANGIOGRAM N/A 04/05/2013   Procedure: LEFT HEART CATHETERIZATION WITH CORONARY ANGIOGRAM;  Surgeon: Peter M Martinique, MD;  Location: [facility] CATH LAB;  Service: Cardiovascular;  Laterality: N/A;  . LOWER EXTREMITY ANGIOGRAM Bilateral 01/06/2013   Procedure: LOWER EXTREMITY ANGIOGRAM;  Surgeon: Conrad [place], MD;  Location: [facility] CATH LAB;  Service: Cardiovascular;  Laterality: Bilateral;  . LOWER EXTREMITY ANGIOGRAM Right 01/13/2013   Procedure: LOWER EXTREMITY ANGIOGRAM;  Surgeon: Conrad , MD;   Location: [facility] CATH LAB;  Service: Cardiovascular;  Laterality: Right;  . SKIN CANCER EXCISION  2017  . THROAT SURGERY  1983   polyps  .  TONSILLECTOMY AND ADENOIDECTOMY  ~ 1965    Social History:  reports that he has been smoking cigarettes. He started smoking about 51 years ago. He has a 46.00 pack-year smoking history. He has never used smokeless tobacco. He reports that he does not drink alcohol or use drugs.  Allergies:  Allergies  Allergen Reactions  . Penicillins Other (See Comments)    Has patient had a PCN reaction causing immediate rash, facial/tongue/throat swelling, SOB or lightheadedness with hypotension: Unknown Has patient had a PCN reaction causing severe rash involving mucus membranes or skin necrosis: Unknown Has patient had a PCN reaction that required hospitalization: Unknown Has patient had a PCN reaction occurring within the last 10 years: Unknown If all of the above answers are "NO", then may proceed with Cephalosporin use.     Medications Prior to Admission  Medication Sig Dispense Refill  . acetaminophen (TYLENOL) 325 MG tablet Take 650 mg by mouth every 6 (six) hours as needed for fever.    Marland Kitchen amLODipine (NORVASC) 5 MG tablet Take 1 tablet (5 mg total) by mouth daily.    Marland Kitchen ascorbic acid (VITAMIN C) 500 MG tablet Take 500 mg by mouth daily.    Marland Kitchen atorvastatin (LIPITOR) 40 MG tablet TAKE ONE (1) TABLET BY MOUTH EVERY DAY AT 6:00PM (Patient taking differently: Take 40 mg by mouth daily at 6 PM. ) 90 tablet 3  . carvedilol (COREG) 12.5 MG tablet Take 1 tablet (12.5 mg total) by mouth 2 (two) times daily with a meal. 60 tablet 0  . cholecalciferol (VITAMIN D) 25 MCG (1000 UT) tablet Take 2,000 Units by mouth 3 (three) times daily.    . cloNIDine (CATAPRES - DOSED IN MG/24 HR) 0.2 mg/24hr patch Place 1 patch (0.2 mg total) onto the skin once a week. 4 patch 0  . Darbepoetin Alfa (ARANESP) 40 MCG/0.4ML SOSY injection Inject 0.4 mLs (40 mcg total) into the skin every 30  (thirty) days. 8.4 mL 2  . doxazosin (CARDURA) 8 MG tablet Take 1 tablet (8 mg total) by mouth every evening. (Patient taking differently: Take 8 mg by mouth daily. )    . feeding supplement, ENSURE ENLIVE, (ENSURE ENLIVE) LIQD Take 237 mLs by mouth 3 (three) times daily between meals. 237 mL 12  . ferrous sulfate (FEROSUL) 325 (65 FE) MG tablet Take 1 tablet (325 mg total) by mouth daily at 12 noon. (Patient taking differently: Take 325 mg by mouth daily. ) 30 tablet 0  . Glycopyrrolate (LONHALA MAGNAIR STARTER KIT) 25 MCG/ML SOLN Inhale 1 mL into the lungs 2 (two) times a day.    . hydrALAZINE (APRESOLINE) 100 MG tablet TAKE ONE TABLET BY MOUTH EVERY EIGHT HOURS (Patient taking differently: Take 100 mg by mouth every 8 (eight) hours. ) 90 tablet 1  . insulin aspart (NOVOLOG) 100 UNIT/ML injection Inject 0-10 Units into the skin 3 (three) times daily before meals. <150= 0 units, 151-200= 2 units, 201-250= 4 units, 251-300= 6 units, 301-350= 8 units, 351-400= 10 units    . isosorbide mononitrate (IMDUR) 120 MG 24 hr tablet Take 1 tablet (120 mg total) by mouth daily. 30 tablet 0  . levothyroxine (SYNTHROID, LEVOTHROID) 100 MCG tablet Take 100 mcg by mouth daily before breakfast.     . nitroGLYCERIN (NITROSTAT) 0.4 MG SL tablet Place 1 tablet (0.4 mg total) under the tongue every 5 (five) minutes as needed for chest pain. (Patient taking differently: Place 0.4 mg under the tongue every 5 (five) minutes as  needed for chest pain. Chest pain) 30 tablet 12  . ondansetron (ZOFRAN) 4 MG tablet Take 4 mg by mouth every 8 (eight) hours as needed for nausea or vomiting.    . pantoprazole (PROTONIX) 40 MG tablet Take 1 tablet (40 mg total) by mouth 2 (two) times daily.    . polyethylene glycol (MIRALAX / GLYCOLAX) packet Take 17 g by mouth daily as needed for mild constipation or moderate constipation. 14 each 0  . torsemide (DEMADEX) 20 MG tablet Take 2 tablets (40 mg total) by mouth daily. 120 tablet 11  .  umeclidinium bromide (INCRUSE ELLIPTA) 62.5 MCG/INH AEPB Inhale 1 puff into the lungs daily. 30 each 0  . Blood Glucose Monitoring Suppl (ONETOUCH VERIO FLEX SYSTEM) w/Device KIT 1 kit by Does not apply route daily. 1 kit 3  . glucose blood (ONETOUCH VERIO) test strip Use as instructed to test three times daily. ICD-10 code: E11.22 100 each 12  . ONETOUCH DELICA LANCETS 84Z MISC 1 each by Does not apply route 3 (three) times daily. ICD-10 code: E11.22 (Patient not taking: Reported on 07/15/2018) 100 each 12    Blood pressure (!) 119/58, pulse 84, temperature 98.3 F (36.8 C), temperature source Oral, resp. rate 18, height _0  (1.727 m), weight 84 kg, SpO2 95 %. Physical Exam: Physical Exam Vitals signs reviewed.  Constitutional:      General: He is not in acute distress.    Appearance: He is normal weight. He is not ill-appearing, toxic-appearing or diaphoretic.  HENT:     Head: Normocephalic and atraumatic.     Mouth/Throat:     Mouth: Mucous membranes are moist.     Pharynx: Oropharynx is clear.  Eyes:     General: No scleral icterus.    Comments: Pupils are equal  Neck:     Musculoskeletal: Normal range of motion and neck supple. No neck rigidity or muscular tenderness.     Vascular: No carotid bruit.  Cardiovascular:     Rate and Rhythm: Normal rate and regular rhythm.     Heart sounds: No murmur.     Comments: Left AKA, decreased pulsed RLE Midline sternotomy scar/CT site from CABG Pulmonary:     Effort: Pulmonary effort is normal. No respiratory distress.     Breath sounds: Normal breath sounds. No stridor. No wheezing, rhonchi or rales.  Chest:     Chest wall: No tenderness.  Abdominal:     General: Abdomen is flat. Bowel sounds are normal. There is no distension.     Palpations: Abdomen is soft. There is no mass.     Tenderness: There is no abdominal tenderness. There is no right CVA tenderness, left CVA tenderness, guarding or rebound.     Hernia: No hernia is  present.  Lymphadenopathy:     Cervical: No cervical adenopathy.  Skin:    General: Skin is warm and dry.  Neurological:     General: No focal deficit present.     Mental Status: He is alert. Mental status is at baseline.     Comments: He is really hard of hearing so it's difficult to know if he understands question or just isn't hearing [place].   Psychiatric:        Mood and Affect: Mood normal.        Thought Content: Thought content normal.     Results for orders placed or performed during the hospital encounter of 11/01/18 (from the past 48 hour(s))  CBC with Differential  Status: Abnormal   Collection Time: 11/01/18  4:13 PM  Result Value Ref Range   WBC 4.1 4.0 - 10.5 K/uL   RBC 1.50 (L) 4.22 - 5.81 MIL/uL   Hemoglobin 4.5 (LL) 13.0 - 17.0 g/dL    Comment: This critical result has verified and been called to [facility]. RN by Sarita Bottom on 09 28 2020 at 1720, and has been read back. CRITICAL RESULT VERIFIED   HCT 14.8 (L) 39.0 - 52.0 %   MCV 98.7 80.0 - 100.0 fL   MCH 30.0 26.0 - 34.0 pg   MCHC 30.4 30.0 - 36.0 g/dL   RDW 17.0 (H) 11.5 - 15.5 %   Platelets 87 (L) 150 - 400 K/uL    Comment: REPEATED TO VERIFY PLATELET COUNT CONFIRMED BY SMEAR SPECIMEN CHECKED FOR CLOTS Immature Platelet Fraction may be clinically indicated, consider ordering this additional test [id number]    nRBC 0.0 0.0 - 0.2 %   Neutrophils Relative % 64 %   Neutro Abs 2.6 1.7 - 7.7 K/uL   Lymphocytes Relative 13 %   Lymphs Abs 0.5 (L) 0.7 - 4.0 K/uL   Monocytes Relative 9 %   Monocytes Absolute 0.4 0.1 - 1.0 K/uL   Eosinophils Relative 12 %   Eosinophils Absolute 0.5 0.0 - 0.5 K/uL   Basophils Relative 1 %   Basophils Absolute 0.0 0.0 - 0.1 K/uL   Immature Granulocytes 1 %   Abs Immature Granulocytes 0.03 0.00 - 0.07 K/uL    Comment: Performed at [facility], [place] [address], [place], [place] [address]  Basic metabolic panel     Status: Abnormal   Collection Time: 11/01/18   4:13 PM  Result Value Ref Range   Sodium 143 135 - 145 mmol/L   Potassium 3.2 (L) 3.5 - 5.1 mmol/L   Chloride 112 (H) 98 - 111 mmol/L   CO2 19 (L) 22 - 32 mmol/L   Glucose, Bld 242 (H) 70 - 99 mg/dL   BUN 49 (H) 8 - 23 mg/dL   Creatinine, Ser 2.85 (H) 0.61 - 1.24 mg/dL   Calcium 8.1 (L) 8.9 - 10.3 mg/dL   GFR calc non Af Amer 22 (L) >60 mL/min   GFR calc Af Amer 25 (L) >60 mL/min   Anion gap 12 5 - 15    Comment: Performed at [facility], [place] [address] [place], [place] [address]  Type and screen [place]     Status: None   Collection Time: 11/01/18  4:13 PM  Result Value Ref Range   ABO/RH(D) B NEG    Antibody Screen NEG    Sample Expiration 11/04/2018,2359    Unit Number [id number]    Blood Component Type RBC, LR IRR    Unit division 00    Status of Unit ISSUED,FINAL    Transfusion Status OK TO TRANSFUSE    Crossmatch Result Compatible    Unit Number [id number]    Blood Component Type RBC, LR IRR    Unit division 00    Status of Unit ISSUED,FINAL    Transfusion Status OK TO TRANSFUSE    Crossmatch Result Compatible    Unit Number [id number]    Blood Component Type RBC, LR IRR    Unit division 00    Status of Unit ISSUED,FINAL    Transfusion Status OK TO TRANSFUSE    Crossmatch Result      Compatible Performed at [facility] [address]  [place], [place] [address]   Prepare RBC     Status: None   Collection Time: 11/01/18  6:00 PM  Result Value Ref Range   Order Confirmation      ORDER PROCESSED BY BLOOD BANK Performed at [facility] [address], [place], [place] [address]   SARS CORONAVIRUS 2 (TAT 6-24 HRS) Nasopharyngeal Nasopharyngeal Swab     Status: None   Collection Time: 11/01/18  7:19 PM   Specimen: Nasopharyngeal Swab  Result Value Ref Range   SARS Coronavirus 2 NEGATIVE NEGATIVE    Comment: (NOTE) SARS-CoV-2 target nucleic acids are NOT DETECTED. The  SARS-CoV-2 RNA is generally detectable in upper and lower respiratory specimens during the acute phase of infection. Negative results do not preclude SARS-CoV-2 infection, do not rule out co-infections with other pathogens, and should not be used as the sole basis for treatment or other patient management decisions. Negative results must be combined with clinical observations, patient history, and epidemiological information. The expected result is Negative. Fact Sheet for Patients: SugarRoll.be Fact Sheet for Healthcare Providers: https://www.woods-mathews.com/ This test is not yet approved or cleared by the [place] FDA and  has been authorized for detection and/or diagnosis of SARS-CoV-2 by FDA under an Emergency Use Authorization (EUA). This EUA will remain  in effect (meaning this test can be used) for the duration of the COVID-19 declaration under Section 56 4(b)(1) of the Act, 21 U.S.C. section 360bbb-3(b)(1), unless the authorization is terminated or revoked sooner. Performed at [facility], [place] [address], [place], [place] [address]   Glucose, capillary     Status: Abnormal   Collection Time: 11/01/18  9:08 PM  Result Value Ref Range   Glucose-Capillary 220 (H) 70 - 99 mg/dL  Glucose, capillary     Status: Abnormal   Collection Time: 11/01/18  9:12 PM  Result Value Ref Range   Glucose-Capillary 192 (H) 70 - 99 mg/dL  Glucose, capillary     Status: Abnormal   Collection Time: 11/02/18 12:52 AM  Result Value Ref Range   Glucose-Capillary 142 (H) 70 - 99 mg/dL  Glucose, capillary     Status: Abnormal   Collection Time: 11/02/18  3:54 AM  Result Value Ref Range   Glucose-Capillary 119 (H) 70 - 99 mg/dL  Comprehensive metabolic panel     Status: Abnormal   Collection Time: 11/02/18  5:31 AM  Result Value Ref Range   Sodium 144 135 - 145 mmol/L   Potassium 3.0 (L) 3.5 - 5.1 mmol/L   Chloride 113 (H) 98 - 111 mmol/L    CO2 21 (L) 22 - 32 mmol/L   Glucose, Bld 135 (H) 70 - 99 mg/dL   BUN 48 (H) 8 - 23 mg/dL   Creatinine, Ser 2.69 (H) 0.61 - 1.24 mg/dL   Calcium 8.0 (L) 8.9 - 10.3 mg/dL   Total Protein 5.4 (L) 6.5 - 8.1 g/dL   Albumin 3.0 (L) 3.5 - 5.0 g/dL   AST 14 (L) 15 - 41 U/L   ALT 11 0 - 44 U/L   Alkaline Phosphatase 101 38 - 126 U/L   Total Bilirubin 1.2 0.3 - 1.2 mg/dL   GFR calc non Af Amer 23 (L) >60 mL/min   GFR calc Af Amer 27 (L) >60 mL/min   Anion gap 10 5 - 15    Comment: Performed at [facility], [place] [address] [place], [place] [address]  CBC     Status: Abnormal  Collection Time: 11/02/18  5:31 AM  Result Value Ref Range   WBC 5.3 4.0 - 10.5 K/uL   RBC 2.11 (L) 4.22 - 5.81 MIL/uL   Hemoglobin 6.5 (LL) 13.0 - 17.0 g/dL    Comment: CRITICAL VALUE NOTED.  VALUE IS CONSISTENT WITH PREVIOUSLY REPORTED AND CALLED VALUE.   HCT 20.2 (L) 39.0 - 52.0 %   MCV 95.7 80.0 - 100.0 fL   MCH 30.8 26.0 - 34.0 pg   MCHC 32.2 30.0 - 36.0 g/dL   RDW 16.4 (H) 11.5 - 15.5 %   Platelets 88 (L) 150 - 400 K/uL    Comment: Immature Platelet Fraction may be clinically indicated, consider ordering this additional test [id number] CONSISTENT WITH PREVIOUS RESULT    nRBC 0.0 0.0 - 0.2 %    Comment: Performed at [facility], [place] [address], [place], [place] [address]  Prepare RBC     Status: None   Collection Time: 11/02/18  6:52 AM  Result Value Ref Range   Order Confirmation      ORDER PROCESSED BY BLOOD BANK Performed at [facility], [place] [address] [place], [place] [address]   Glucose, capillary     Status: Abnormal   Collection Time: 11/02/18  8:33 AM  Result Value Ref Range   Glucose-Capillary 134 (H) 70 - 99 mg/dL  Glucose, capillary     Status: Abnormal   Collection Time: 11/02/18 12:20 PM  Result Value Ref Range   Glucose-Capillary 130 (H) 70 - 99 mg/dL  Hemoglobin and hematocrit, blood     Status: Abnormal   Collection Time:  11/02/18  3:16 PM  Result Value Ref Range   Hemoglobin 7.8 (L) 13.0 - 17.0 g/dL   HCT 23.7 (L) 39.0 - 52.0 %    Comment: Performed at [facility], [place] [address], [place], [place] [address]  Vitamin B12     Status: None   Collection Time: 11/02/18  3:16 PM  Result Value Ref Range   Vitamin B-12 263 180 - 914 pg/mL    Comment: (NOTE) This assay is not validated for testing neonatal or myeloproliferative syndrome specimens for Vitamin B12 levels. Performed at [facility], [place] [address], [place], [place] [address]   Folate     Status: Abnormal   Collection Time: 11/02/18  3:16 PM  Result Value Ref Range   Folate 4.6 (L) >5.9 ng/mL    Comment: Performed at [facility],  [address], [place], [place] [address]  Iron and TIBC     Status: Abnormal   Collection Time: 11/02/18  3:16 PM  Result Value Ref Range   Iron 46 45 - 182 ug/dL   TIBC 234 (L) 250 - 450 ug/dL   Saturation Ratios 20 17.9 - 39.5 %   UIBC 188 ug/dL    Comment: Performed at [facility], [address] [place], [place] [address]  Ferritin     Status: Abnormal   Collection Time: 11/02/18  3:16 PM  Result Value Ref Range   Ferritin 1,065 (H) 24 - 336 ng/mL    Comment: Performed at [facility], [place] [address] [place], [place] [address]  Reticulocytes     Status: Abnormal   Collection Time: 11/02/18  3:16 PM  Result Value Ref Range   Retic Ct Pct 4.0 (H) 0.4 - 3.1 %   RBC. 2.59 (L) 4.22 - 5.81 MIL/uL   Retic Count, Absolute 102.6 19.0 -  186.0 K/uL   Immature Retic Fract 25.9 (H) 2.3 - 15.9 %    Comment: Performed at [facility], [place] [address], [place], [place] [address]  Glucose, capillary     Status: Abnormal   Collection Time: 11/02/18  4:07 PM  Result Value Ref Range   Glucose-Capillary 164 (H) 70 - 99 mg/dL  Glucose, capillary     Status: Abnormal   Collection Time: 11/02/18  7:59 PM   Result Value Ref Range   Glucose-Capillary 139 (H) 70 - 99 mg/dL   Comment 1 Notify RN    Comment 2 Document in Chart   Glucose, capillary     Status: Abnormal   Collection Time: 11/02/18 11:52 PM  Result Value Ref Range   Glucose-Capillary 151 (H) 70 - 99 mg/dL   Comment 1 Notify RN    Comment 2 Document in Chart   Basic metabolic panel     Status: Abnormal   Collection Time: 11/03/18  2:42 AM  Result Value Ref Range   Sodium 142 135 - 145 mmol/L   Potassium 3.0 (L) 3.5 - 5.1 mmol/L   Chloride 114 (H) 98 - 111 mmol/L   CO2 21 (L) 22 - 32 mmol/L   Glucose, Bld 115 (H) 70 - 99 mg/dL   BUN 44 (H) 8 - 23 mg/dL   Creatinine, Ser 2.92 (H) 0.61 - 1.24 mg/dL   Calcium 7.8 (L) 8.9 - 10.3 mg/dL   GFR calc non Af Amer 21 (L) >60 mL/min   GFR calc Af Amer 25 (L) >60 mL/min   Anion gap 7 5 - 15    Comment: Performed at [facility], [place] [address] [place], [place] [address]  CBC with Differential/Platelet     Status: Abnormal   Collection Time: 11/03/18  2:42 AM  Result Value Ref Range   WBC 5.9 4.0 - 10.5 K/uL   RBC 2.46 (L) 4.22 - 5.81 MIL/uL   Hemoglobin 7.3 (L) 13.0 - 17.0 g/dL   HCT 23.4 (L) 39.0 - 52.0 %   MCV 95.1 80.0 - 100.0 fL   MCH 29.7 26.0 - 34.0 pg   MCHC 31.2 30.0 - 36.0 g/dL   RDW 18.1 (H) 11.5 - 15.5 %   Platelets 86 (L) 150 - 400 K/uL    Comment: REPEATED TO VERIFY Immature Platelet Fraction may be clinically indicated, consider ordering this additional test [id number] CONSISTENT WITH PREVIOUS RESULT    nRBC 0.0 0.0 - 0.2 %   Neutrophils Relative % 68 %   Neutro Abs 4.1 1.7 - 7.7 K/uL   Lymphocytes Relative 8 %   Lymphs Abs 0.5 (L) 0.7 - 4.0 K/uL   Monocytes Relative 7 %   Monocytes Absolute 0.4 0.1 - 1.0 K/uL   Eosinophils Relative 15 %   Eosinophils Absolute 0.9 (H) 0.0 - 0.5 K/uL   Basophils Relative 1 %   Basophils Absolute 0.0 0.0 - 0.1 K/uL   Immature Granulocytes 1 %   Abs Immature Granulocytes 0.05 0.00 - 0.07 K/uL    Comment:  Performed at [facility], [place] [address], [place], [place] [address]  Glucose, capillary     Status: Abnormal   Collection Time: 11/03/18  4:00 AM  Result Value Ref Range   Glucose-Capillary 122 (H) 70 - 99 mg/dL   Comment 1 Notify RN    Comment 2 Document in Chart   Glucose, capillary     Status: Abnormal   Collection Time: 11/03/18  8:03 AM  Result Value Ref Range   Glucose-Capillary 111 (H) 70 - 99 mg/dL   Comment 1 Notify RN    Comment 2 Document in Chart    *Note: Due to a large number of results and/or encounters for the requested time period, some results have not been displayed. A complete set of results can be found in Results Review.   No results found.    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 Redmond Pulling has discussed with Dr. Barry [name].  Will get CT of the chest, abdomen and pelvis without contrast to look for new metastatic disease.  Allow Medicine to stabilize him medically with adequate transfusions. If CT's are negative arrange follow up with Dr. Barry [name] as an outpatient.  [facility] Surgery Pager:  [phone]    11/03/2018 10:58 AM

## 2018-11-03 NOTE — Progress Notes (Addendum)
PROGRESS NOTE  HUDSEN FEI IEP:[id number] DOB: April 02, 1950   PCP: Kathrene Alu, MD  Patient is from: Home  DOA: 11/01/2018 LOS: 1  Brief Narrative / Interim history: 68 year old male with history of HTN, HLD, DM-2, CAD, PVD/left AKA, CKD-4, COPD, anemia of chronic disease and recent diagnosis of gastric adenocarcinoma s/p radiation as currently deemed non-chemo or surgical candidate presenting with symptomatic anemia with Hgb down to 4.7.  Has received a total of 3 units so far.  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  General surgery consulted as well.  Subjective: No major events overnight of this morning.  No complaints.  Denies chest pain, dyspnea, abdominal pain or urinary issues.  Fairly oriented but not a great historian.  Objective: Vitals:   11/03/18 0800 11/03/18 0900 11/03/18 1000 11/03/18 1100  BP: 131/74  (!) 119/58   Pulse: 73 75 84 73  Resp: 17 14 18 20   Temp: 98.3 F (36.8 C)   98.3 F (36.8 C)  TempSrc: Oral   Oral  SpO2: 98% 99% 95% 96%  Weight:      Height:        Intake/Output Summary (Last 24 hours) at 11/03/2018 1139 Last data filed at 11/03/2018 1100 Gross per 24 hour  Intake 1251.02 ml  Output 1650 ml  Net -398.98 ml   Filed Weights   11/02/18 0500 11/02/18 1248 11/03/18 0500  Weight: 81 kg 81 kg 84 kg    Examination:  GENERAL: No acute distress.  Appears well.  HEENT: MMM.  Vision and hearing grossly intact.  NECK: Supple.  No apparent JVD.  RESP:  No IWOB. Good air movement bilaterally. CVS:  RRR. Heart sounds normal.  ABD/GI/GU: Bowel sounds present. Soft. Non tender.  MSK/EXT: Left AKA.  Moves all extremities. SKIN: no apparent skin lesion or wound NEURO: Awake, alert and oriented fairly.  No gross deficit.  PSYCH: Calm. Normal affect.   Assessment & Plan: Symptomatic acute on chronic blood loss anemia due to upper GI bleed in the  setting of gastric adenocarcinoma -Hemoglobin 4.7 (admit)>2u> 6.5>1u> 7.3 -Anemia panel reveals folate deficiency. -EGD on 9/29 that revealed malignant neoplasm of pyloric antrum and pylorus and hemorrhage.  -Continue IV Protonix 40 mg twice daily and Carafate -Monitor H&H -May have to transfuse to keep hemoglobin greater than 8 given history of CAD. -Replenish folate   Gastric adenocarcinoma s/p radiation as currently deemed non-chemo or surgical candidate  -EGD as above. -General surgery consulted.  CKD-4 with mild azotemia and non-anion gap metabolic acidosis: Stable. -Continue monitoring  Hypokalemia: Likely due to diuretics. -Replenish and recheck  Essential hypertension: Normotensive -Continue amlodipine, Coreg, clonidine and furosemide.  DM-2 with renal complication.  CBG was in fair range. -Continue current regimen.  Hypothyroidism -Continue home Synthroid  Thrombocytopenia: Stable  DVT prophylaxis: SCD due to GI bleed and thrombocytopenia Code Status: Full code Family Communication: None at bedside. Disposition Plan: Transfer to telemetry Consultants: Gastroenterology, general surgery  Procedures:  EGD on 9/29  Microbiology summarized: -COVID-19 screen negative. -MRSA PCR negative.  Antimicrobials: Anti-infectives (From admission, onward)    None       Sch Meds:  Scheduled Meds:  sodium chloride   Intravenous Once   amLODipine  5 mg Oral Daily   atorvastatin  40 mg Oral q1800   carvedilol  12.5 mg Oral BID WC   Chlorhexidine Gluconate Cloth  6 each Topical Daily   [START  ON 11/06/2018] cloNIDine  0.2 mg Transdermal Weekly   doxazosin  8 mg Oral Daily   ferrous sulfate  325 mg Oral Daily   hydrALAZINE  100 mg Oral Q8H   insulin aspart  0-9 Units Subcutaneous Q4H   isosorbide mononitrate  120 mg Oral Daily   levothyroxine  100 mcg Oral QAC breakfast   pantoprazole  40 mg Oral BID   potassium chloride  40 mEq Oral Once   sucralfate  1 g Oral TID  WC & HS   torsemide  40 mg Oral Daily   Continuous Infusions:  sodium chloride     potassium chloride Stopped (11/03/18 1229)   PRN Meds:.sodium chloride, acetaminophen **OR** acetaminophen, ondansetron, polyethylene glycol, sodium chloride flush   I have personally reviewed the following labs and images: CBC: Recent Labs  Lab 11/01/18 1613 11/02/18 0531 11/02/18 1516 11/03/18 0242  WBC 4.1 5.3  --  5.9  NEUTROABS 2.6  --   --  4.1  HGB 4.5* 6.5* 7.8* 7.3*  HCT 14.8* 20.2* 23.7* 23.4*  MCV 98.7 95.7  --  95.1  PLT 87* 88*  --  86*   BMP &GFR Recent Labs  Lab 11/01/18 1613 11/02/18 0531 11/03/18 0242  NA 143 144 142  K 3.2* 3.0* 3.0*  CL 112* 113* 114*  CO2 19* 21* 21*  GLUCOSE 242* 135* 115*  BUN 49* 48* 44*  CREATININE 2.85* 2.69* 2.92*  CALCIUM 8.1* 8.0* 7.8*   Estimated Creatinine Clearance: 25.9 mL/min (A) (by C-G formula based on SCr of 2.92 mg/dL (H)). Liver & Pancreas: Recent Labs  Lab 11/02/18 0531  AST 14*  ALT 11  ALKPHOS 101  BILITOT 1.2  PROT 5.4*  ALBUMIN 3.0*   No results for input(s): LIPASE, AMYLASE in the last 168 hours. No results for input(s): AMMONIA in the last 168 hours. Diabetic: No results for input(s): HGBA1C in the last 72 hours. Recent Labs  Lab 11/02/18 1607 11/02/18 1959 11/02/18 2352 11/03/18 0400 11/03/18 0803  GLUCAP 164* 139* 151* 122* 111*   Cardiac Enzymes: No results for input(s): CKTOTAL, CKMB, CKMBINDEX, TROPONINI in the last 168 hours. No results for input(s): PROBNP in the last 8760 hours. Coagulation Profile: No results for input(s): INR, PROTIME in the last 168 hours. Thyroid Function Tests: No results for input(s): TSH, T4TOTAL, FREET4, T3FREE, THYROIDAB in the last 72 hours. Lipid Profile: No results for input(s): CHOL, HDL, LDLCALC, TRIG, CHOLHDL, LDLDIRECT in the last 72 hours. Anemia Panel: Recent Labs    11/02/18 1516  VITAMINB12 263  FOLATE 4.6*  FERRITIN 1,065*  TIBC 234*  IRON 46   RETICCTPCT 4.0*   Urine analysis:    Component Value Date/Time   COLORURINE YELLOW 06/24/2018 0628   APPEARANCEUR CLOUDY (A) 06/24/2018 0628   LABSPEC 1.012 06/24/2018 0628   PHURINE 7.0 06/24/2018 0628   GLUCOSEU NEGATIVE 06/24/2018 0628   HGBUR NEGATIVE 06/24/2018 0628   BILIRUBINUR NEGATIVE 06/24/2018 0628   BILIRUBINUR NEG 06/26/2015 1540   KETONESUR NEGATIVE 06/24/2018 0628   PROTEINUR 100 (A) 06/24/2018 0628   UROBILINOGEN 1.0 06/26/2015 1540   UROBILINOGEN 0.2 2020-01-2314 2156   NITRITE NEGATIVE 06/24/2018 0628   LEUKOCYTESUR LARGE (A) 06/24/2018 0628   Sepsis Labs: Invalid input(s): PROCALCITONIN, [place]  Microbiology: Recent Results (from the past 240 hour(s))  SARS CORONAVIRUS 2 (TAT 6-24 HRS) Nasopharyngeal Nasopharyngeal Swab     Status: None   Collection Time: 11/01/18  7:19 PM   Specimen: Nasopharyngeal Swab  Result Value Ref  Range Status   SARS Coronavirus 2 NEGATIVE NEGATIVE Final    Comment: (NOTE) SARS-CoV-2 target nucleic acids are NOT DETECTED. The SARS-CoV-2 RNA is generally detectable in upper and lower respiratory specimens during the acute phase of infection. Negative results do not preclude SARS-CoV-2 infection, do not rule out co-infections with other pathogens, and should not be used as the sole basis for treatment or other patient management decisions. Negative results must be combined with clinical observations, patient history, and epidemiological information. The expected result is Negative. Fact Sheet for Patients: SugarRoll.be Fact Sheet for Healthcare Providers: https://www.woods-mathews.com/ This test is not yet approved or cleared by the [place] FDA and  has been authorized for detection and/or diagnosis of SARS-CoV-2 by FDA under an Emergency Use Authorization (EUA). This EUA will remain  in effect (meaning this test can be used) for the duration of the COVID-19 declaration under  Section 56 4(b)(1) of the Act, 21 U.S.C. section 360bbb-3(b)(1), unless the authorization is terminated or revoked sooner. Performed at [facility], [place] [address] [place], [place] [address]     Radiology Studies: No results found.  35 minutes with more than 50% spent in reviewing records, counseling patient and coordinating care.  Angella Montas T. [place]  If 7PM-7AM, please contact night-coverage [email or web address] Password [email or web address] 11/03/2018, 11:39 AM

## 2018-11-03 NOTE — Progress Notes (Addendum)
Patient ID: Leonard [name], male   DOB: September 26, 1950, 68 y.o.   MRN: [id number]    Progress Note   Subjective   Day # 3  CC; gastric cancer, GI bleeding  HGB 7.3 this am  After 3 units plts 86 Fe 46/sat 20 /ferritin 1000  Patient sleeping comfortably, did not awaken to exam.    Objective   Vital signs in last 24 hours: Temp:  [97.8 F (36.6 C)-98.8 F (37.1 C)] 98.3 F (36.8 C) (09/30 0800) Pulse Rate:  [60-76] 75 (09/30 0900) Resp:  [11-22] 14 (09/30 0900) BP: (98-189)/(41-78) 131/74 (09/30 0800) SpO2:  [89 %-100 %] 99 % (09/30 0900) Weight:  [81 kg-84 kg] 84 kg (09/30 0500)   General:    AA male  in NAD Heart:  Regular rate and rhythm; no murmurs Lungs: Respirations even and unlabored, lungs CTA bilaterally Abdomen:  Soft, nontender and nondistended. Normal bowel sounds. Extremities:  Without edema. Neurologic:  Alert and oriented,  grossly normal neurologically. Psych:  Cooperative. Normal mood and affect.   Lab Results: Recent Labs    11/01/18 1613 11/02/18 0531 11/02/18 1516 11/03/18 0242  WBC 4.1 5.3  --  5.9  HGB 4.5* 6.5* 7.8* 7.3*  HCT 14.8* 20.2* 23.7* 23.4*  PLT 87* 88*  --  86*   BMET Recent Labs    11/01/18 1613 11/02/18 0531 11/03/18 0242  NA 143 144 142  K 3.2* 3.0* 3.0*  CL 112* 113* 114*  CO2 19* 21* 21*  GLUCOSE 242* 135* 115*  BUN 49* 48* 44*  CREATININE 2.85* 2.69* 2.92*  CALCIUM 8.1* 8.0* 7.8*   LFT Recent Labs    11/02/18 0531  PROT 5.4*  ALBUMIN 3.0*  AST 14*  ALT 11  ALKPHOS 101  BILITOT 1.2        Assessment / Plan:    #8 68 year old African-American male with persistent low-grade GI bleeding secondary to known gastric adenocarcinoma which was diagnosed in February 2020.  He completed a course of radiation therapy.  Was not felt to be candidate for chemotherapy. Patient has required multiple units of blood over the past 8 months, and readmitted with hemoglobin of 4. He is stable after 3 units of packed RBCs.  Repeat EGD yesterday showed a medium-sized infiltrative and ulcerated circumferential mass with active oozing in the distal gastric antrum and in the prepyloric region at the pylorus, not obstructing.  Patient had been evaluated by surgery earlier this year and deemed not to be a surgical candidate. There is nothing to offer from an endoscopic standpoint to help with his persistent low-grade bleeding.  Have asked Surgery to reevaluate for antrectomy.  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 I agree with the Advanced Practitioner's note, impression and recommendations.   Signing off - please call us back if something changes to suggest endoscopic evaluation/treatment might help  Gatha Mayer, MD, [facility] Gastroenterology 11/03/2018 5:39 PM Pager [phone]

## 2018-11-03 NOTE — Progress Notes (Signed)
Patient's sister, Wynelle Beckmann, feels like Patient's hearing has gotten worse considerably.  She would like to have his hearing assessed because she does not understand why it's worse.

## 2018-11-03 NOTE — Progress Notes (Signed)
Sister updated via telephone. All questions answered.

## 2018-11-04 ENCOUNTER — Other Ambulatory Visit (HOSPITAL_COMMUNITY): Payer: Medicare Other

## 2018-11-04 ENCOUNTER — Inpatient Hospital Stay (HOSPITAL_COMMUNITY): Payer: Medicare Other

## 2018-11-04 ENCOUNTER — Telehealth: Payer: Self-pay | Admitting: Radiation Oncology

## 2018-11-04 DIAGNOSIS — J90 Pleural effusion, not elsewhere classified: Secondary | ICD-10-CM

## 2018-11-04 LAB — BODY FLUID CELL COUNT WITH DIFFERENTIAL
Eos, Fluid: 7 %
Lymphs, Fluid: 23 %
Monocyte-Macrophage-Serous Fluid: 60 % (ref 50–90)
Neutrophil Count, Fluid: 10 % (ref 0–25)
Total Nucleated Cell Count, Fluid: 106 cu mm (ref 0–1000)

## 2018-11-04 LAB — BASIC METABOLIC PANEL
Anion gap: 8 (ref 5–15)
BUN: 48 mg/dL — ABNORMAL HIGH (ref 8–23)
CO2: 20 mmol/L — ABNORMAL LOW (ref 22–32)
Calcium: 7.9 mg/dL — ABNORMAL LOW (ref 8.9–10.3)
Chloride: 114 mmol/L — ABNORMAL HIGH (ref 98–111)
Creatinine, Ser: 2.77 mg/dL — ABNORMAL HIGH (ref 0.61–1.24)
GFR calc Af Amer: 26 mL/min — ABNORMAL LOW (ref >60)
GFR calc non Af Amer: 23 mL/min — ABNORMAL LOW (ref >60)
Glucose, Bld: 114 mg/dL — ABNORMAL HIGH (ref 70–99)
Potassium: 3.5 mmol/L (ref 3.5–5.1)
Sodium: 142 mmol/L (ref 135–145)

## 2018-11-04 LAB — LACTATE DEHYDROGENASE, PLEURAL OR PERITONEAL FLUID: LD, Fluid: 39 U/L — ABNORMAL HIGH (ref 3–23)

## 2018-11-04 LAB — HEPATIC FUNCTION PANEL
ALT: 11 U/L (ref 0–44)
AST: 13 U/L — ABNORMAL LOW (ref 15–41)
Albumin: 2.8 g/dL — ABNORMAL LOW (ref 3.5–5.0)
Alkaline Phosphatase: 105 U/L (ref 38–126)
Bilirubin, Direct: 0.1 mg/dL (ref 0.0–0.2)
Indirect Bilirubin: 0.6 mg/dL (ref 0.3–0.9)
Total Bilirubin: 0.7 mg/dL (ref 0.3–1.2)
Total Protein: 5.4 g/dL — ABNORMAL LOW (ref 6.5–8.1)

## 2018-11-04 LAB — MAGNESIUM: Magnesium: 2 mg/dL (ref 1.7–2.4)

## 2018-11-04 LAB — CBC
HCT: 22.6 % — ABNORMAL LOW (ref 39.0–52.0)
Hemoglobin: 7.1 g/dL — ABNORMAL LOW (ref 13.0–17.0)
MCH: 30.2 pg (ref 26.0–34.0)
MCHC: 31.4 g/dL (ref 30.0–36.0)
MCV: 96.2 fL (ref 80.0–100.0)
Platelets: 79 10*3/uL — ABNORMAL LOW (ref 150–400)
RBC: 2.35 MIL/uL — ABNORMAL LOW (ref 4.22–5.81)
RDW: 17.9 % — ABNORMAL HIGH (ref 11.5–15.5)
WBC: 5 10*3/uL (ref 4.0–10.5)
nRBC: 0 % (ref 0.0–0.2)

## 2018-11-04 LAB — PROTEIN, PLEURAL OR PERITONEAL FLUID: Total protein, fluid: <3 g/dL

## 2018-11-04 LAB — GLUCOSE, CAPILLARY
Glucose-Capillary: 106 mg/dL — ABNORMAL HIGH (ref 70–99)
Glucose-Capillary: 135 mg/dL — ABNORMAL HIGH (ref 70–99)
Glucose-Capillary: 138 mg/dL — ABNORMAL HIGH (ref 70–99)
Glucose-Capillary: 165 mg/dL — ABNORMAL HIGH (ref 70–99)
Glucose-Capillary: 185 mg/dL — ABNORMAL HIGH (ref 70–99)
Glucose-Capillary: 94 mg/dL (ref 70–99)

## 2018-11-04 LAB — HEMOGLOBIN AND HEMATOCRIT, BLOOD
HCT: 26.1 % — ABNORMAL LOW (ref 39.0–52.0)
Hemoglobin: 8.3 g/dL — ABNORMAL LOW (ref 13.0–17.0)

## 2018-11-04 LAB — PREPARE RBC (CROSSMATCH)

## 2018-11-04 MED ORDER — LIDOCAINE HCL 1 % IJ SOLN
INTRAMUSCULAR | Status: AC
  Filled 2018-11-04: qty 20

## 2018-11-04 MED ORDER — SODIUM CHLORIDE 0.9% IV SOLUTION
Freq: Once | INTRAVENOUS | Status: AC
  Administered 2018-11-04: 10:00:00 via INTRAVENOUS

## 2018-11-04 NOTE — Progress Notes (Signed)
Updated patient's Sister, Ms. [name] Kocher over the phone.

## 2018-11-04 NOTE — Consult Note (Addendum)
[facility] CM Inpatient Consult   11/04/2018  Leonard [name] 02/23/1950 [id number]    Patient was checked for [facility] care management service needs for a 45% extreme high risk score for unplanned readmission, with 5 hospitalizations in the past 6 months, under his Medicare/ [employer] plan. Patient had been outreached by [facility] in the past as referred by MD until transitioned from short term rehab to long term care.     Chart review reveals thatpatient currently lives at skilled [facility].   No [facility] Care Management needs identified at this point for post hospital follow-up as patient's care will be met at that level of care.   For questions and additional information, please call:  Rebekah Zackery A. Stina Gane, BSN, RN-BC [facility] Liaison Cell: [phone]

## 2018-11-04 NOTE — Procedures (Signed)
Ultrasound-guided diagnostic and therapeutic right thoracentesis performed yielding 1 liter of hazy, amber fluid. No immediate complications. Follow-up chest x-ray pending.  The fluid was submitted to the lab for preordered studies. EBL<1 cc.

## 2018-11-04 NOTE — Progress Notes (Signed)
PROGRESS NOTE  Leonard [name]:[id number] DOB: 01-17-1951   PCP: Kathrene Alu, MD  Patient is from: Home  DOA: 11/01/2018 LOS: 2  Brief Narrative / Interim history: 68 year old male with history of HTN, HLD, DM-2, CAD, PVD/left AKA, CKD-4, COPD, anemia of chronic disease and recent diagnosis of gastric adenocarcinoma s/p radiation as currently deemed non-chemo or surgical candidate presenting with symptomatic anemia with Hgb down to 4.7.  Has received a total of 3 units so far.  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  General surgery consulted as well.  CT chest/abdomen/pelvis revealed bilateral mild-to-moderate pleural effusion, right greater than left, with basilar consolidative changes, patchy airway opacities and interstitial thickening,  enlarged lymph nodes in his chest and cardiac enlargement.  Subjective: No major events overnight of this morning.  Sleepy but arises to voice easily.  Has no complaint this morning.  Only oriented to self and place.  Denies chest pain, dyspnea, GI or GU symptoms but not sure if he is reliable historian. Objective: Vitals:   11/04/18 0815 11/04/18 1018 11/04/18 1020 11/04/18 1052  BP: (!) 154/58 (!) 156/60 (!) 156/60 (!) 159/58  Pulse: 69  72 70  Resp:   18 17  Temp:   99.2 F (37.3 C) 99.2 F (37.3 C)  TempSrc:   Oral Oral  SpO2:   93% 94%  Weight:      Height:        Intake/Output Summary (Last 24 hours) at 11/04/2018 1134 Last data filed at 11/04/2018 1000 Gross per 24 hour  Intake 668.15 ml  Output 1100 ml  Net -431.85 ml   Filed Weights   11/02/18 1248 11/03/18 0500 11/04/18 0411  Weight: 81 kg 84 kg 83.9 kg    Examination:  GENERAL: No acute distress.  Sleepy but arises to voice easily. HEENT: MMM.  Vision and hearing grossly intact.  NECK: Supple.  No apparent JVD.  RESP:  No IWOB.  Fair air movement bilaterally. CVS:  RRR.  Heart sounds normal.  ABD/GI/GU: Bowel sounds present. Soft. Non tender.  MSK/EXT:  Moves extremities. No apparent deformity or edema.  SKIN: no apparent skin lesion or wound NEURO: Sleepy but arouses to voice easily.  And oriented x2.Marland Kitchen  No gross deficit.  PSYCH: Calm. Normal affect.   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  -Continue by mouth Carafate -Change IV Protonix to p.o. -Monitor H&H -PT/OT eval  Gastric adenocarcinoma s/p radiation as currently deemed non-chemo or surgical candidate  -EGD as above. -CT chest/abdomen/pelvis reveals bilateral pleural effusion and intrathoracic lymphadenopathy concerning for metastasis. -General surgery to arrange outpatient follow-up with [name] to discuss palliative gastric resection  Mild to moderate bilateral pleural effusion, right greater than left.  Patient does not have cardiopulmonary symptoms.  Differential diagnosis includes CHF, metastatic cancer or pulmonary infection.  Last echo in 2019 with normal EF but moderate to severe LVH. He has no fever, leukocytosis or respiratory symptoms. -We will obtain ultrasound-guided thoracocentesis -Send fluid for study. -Continue torsemide.  History of CAD/CABG in 2015: No anginal symptoms. -Continue home medications  Chronic COPD: Stable.  No cardiopulmonary symptoms. -Continue home medications and PRN nebulizer  CKD-4 with mild azotemia and non-anion  gap metabolic acidosis: Stable. -Continue monitoring  Hypokalemia: Likely due to diuretics. -Replenish and recheck  Essential hypertension: Normotensive -Continue amlodipine, Coreg, clonidine and furosemide.  DM-2 with renal complication.  CBG was in fair range.  -Continue current regimen.  Hypothyroidism -Continue home Synthroid  Thrombocytopenia: Platelet down to 79 today.  DVT prophylaxis: SCD due to GI bleed and thrombocytopenia Code Status: Full code Family Communication: None at bedside. Disposition Plan: Anticipate discharge in the next 24 to 48 hours if H&H and electrolytes stable. Consultants: Gastroenterology, general surgery  Procedures:  EGD on 9/29  Microbiology summarized: -COVID-19 screen negative. -MRSA PCR negative.  Antimicrobials: Anti-infectives (From admission, onward)   None      Sch Meds:  Scheduled Meds: . sodium chloride   Intravenous Once  . amLODipine  5 mg Oral Daily  . atorvastatin  40 mg Oral q1800  . carvedilol  12.5 mg Oral BID WC  . Chlorhexidine Gluconate Cloth  6 each Topical Daily  . [START ON 11/06/2018] cloNIDine  0.2 mg Transdermal Weekly  . doxazosin  8 mg Oral Daily  . ferrous sulfate  325 mg Oral Daily  . hydrALAZINE  100 mg Oral Q8H  . insulin aspart  0-9 Units Subcutaneous Q4H  . isosorbide mononitrate  120 mg Oral Daily  . levothyroxine  100 mcg Oral QAC breakfast  . pantoprazole  40 mg Oral BID  . sucralfate  1 g Oral TID WC & HS  . torsemide  40 mg Oral Daily   Continuous Infusions: . sodium chloride     PRN Meds:.sodium chloride, acetaminophen **OR** acetaminophen, iohexol, ondansetron, polyethylene glycol, sodium chloride flush   I have personally reviewed the following labs and images: CBC: Recent Labs  Lab 11/01/18 1613 11/02/18 0531 11/02/18 1516 11/03/18 0242 11/04/18 0458  WBC 4.1 5.3  --  5.9 5.0  NEUTROABS 2.6  --   --  4.1  --   HGB 4.5* 6.5* 7.8* 7.3* 7.1*  HCT 14.8* 20.2* 23.7* 23.4* 22.6*  MCV 98.7 95.7  --  95.1 96.2  PLT 87* 88*  --  86* 79*   BMP &GFR Recent Labs  Lab 11/01/18 1613 11/02/18 0531 11/03/18 0242 11/04/18 0458  NA 143 144 142 142  K 3.2* 3.0* 3.0* 3.5  CL 112* 113* 114* 114*  CO2 19* 21* 21* 20*  GLUCOSE 242* 135* 115* 114*   BUN 49* 48* 44* 48*  CREATININE 2.85* 2.69* 2.92* 2.77*  CALCIUM 8.1* 8.0* 7.8* 7.9*  MG  --   --   --  2.0   Estimated Creatinine Clearance: 27.3 mL/min (A) (by C-G formula based on SCr of 2.77 mg/dL (H)). Liver & Pancreas: Recent Labs  Lab 11/02/18 0531  AST 14*  ALT 11  ALKPHOS 101  BILITOT 1.2  PROT 5.4*  ALBUMIN 3.0*   No results for input(s): LIPASE, AMYLASE in the last 168 hours. No results for input(s): AMMONIA in the last 168 hours. Diabetic: No results for input(s): HGBA1C in the last 72 hours. Recent Labs  Lab 11/03/18 2049 11/04/18 0013 11/04/18 0407 11/04/18 0738 11/04/18 1128  GLUCAP 200* 165* 185* 94 106*   Cardiac Enzymes: No results for input(s): CKTOTAL, CKMB, CKMBINDEX, TROPONINI in the last 168 hours. No results for input(s): PROBNP in the last 8760 hours. Coagulation Profile: No results for input(s): INR, PROTIME in the last 168 hours. Thyroid Function Tests: No results for input(s): TSH, T4TOTAL, FREET4, T3FREE, THYROIDAB in the last 72 hours. Lipid Profile: No results for  input(s): CHOL, HDL, LDLCALC, TRIG, CHOLHDL, LDLDIRECT in the last 72 hours. Anemia Panel: Recent Labs    11/02/18 1516  VITAMINB12 263  FOLATE 4.6*  FERRITIN 1,065*  TIBC 234*  IRON 46  RETICCTPCT 4.0*   Urine analysis:    Component Value Date/Time   COLORURINE YELLOW 06/24/2018 0628   APPEARANCEUR CLOUDY (A) 06/24/2018 0628   LABSPEC 1.012 06/24/2018 0628   PHURINE 7.0 06/24/2018 0628   GLUCOSEU NEGATIVE 06/24/2018 0628   HGBUR NEGATIVE 06/24/2018 0628   BILIRUBINUR NEGATIVE 06/24/2018 0628   BILIRUBINUR NEG 06/26/2015 1540   KETONESUR NEGATIVE 06/24/2018 0628   PROTEINUR 100 (A) 06/24/2018 0628   UROBILINOGEN 1.0 06/26/2015 1540   UROBILINOGEN 0.2 2020/11/1513 2156   NITRITE NEGATIVE 06/24/2018 0628   LEUKOCYTESUR LARGE (A) 06/24/2018 0628   Sepsis Labs: Invalid input(s): PROCALCITONIN, [place]  Microbiology: Recent Results (from the past 240  hour(s))  SARS CORONAVIRUS 2 (TAT 6-24 HRS) Nasopharyngeal Nasopharyngeal Swab     Status: None   Collection Time: 11/01/18  7:19 PM   Specimen: Nasopharyngeal Swab  Result Value Ref Range Status   SARS Coronavirus 2 NEGATIVE NEGATIVE Final    Comment: (NOTE) SARS-CoV-2 target nucleic acids are NOT DETECTED. The SARS-CoV-2 RNA is generally detectable in upper and lower respiratory specimens during the acute phase of infection. Negative results do not preclude SARS-CoV-2 infection, do not rule out co-infections with other pathogens, and should not be used as the sole basis for treatment or other patient management decisions. Negative results must be combined with clinical observations, patient history, and epidemiological information. The expected result is Negative. Fact Sheet for Patients: SugarRoll.be Fact Sheet for Healthcare Providers: https://www.woods-mathews.com/ This test is not yet approved or cleared by the [place] FDA and  has been authorized for detection and/or diagnosis of SARS-CoV-2 by FDA under an Emergency Use Authorization (EUA). This EUA will remain  in effect (meaning this test can be used) for the duration of the COVID-19 declaration under Section 56 4(b)(1) of the Act, 21 U.S.C. section 360bbb-3(b)(1), unless the authorization is terminated or revoked sooner. Performed at [facility], [place] [address] [place], [place] [address]     Radiology Studies: Ct Abdomen Pelvis Wo Contrast  Result Date: 11/04/2018 CLINICAL DATA:  Gastric cancer, evaluate for metastatic disease, history of GI bleed. EXAM: CT CHEST, ABDOMEN AND PELVIS WITHOUT CONTRAST TECHNIQUE: Multidetector CT imaging of the chest, abdomen and pelvis was performed following the standard protocol without IV contrast. COMPARISON:  March 31, 2018 03/28/2018 FINDINGS: CT CHEST FINDINGS Cardiovascular: Extensive calcifications throughout the thoracic  aorta with signs of calcified coronary artery disease similar to previous study. Heart size remains enlarged without pericardial effusion and without significant change from previous exam. Mild engorgement of central pulmonary vasculature. Mediastinum/Nodes: Scattered lymph nodes throughout the mediastinum, largest along the right paratracheal chain measuring 1.2 cm previously 0.8 cm. Subcarinal lymph node also showing enlargement at approximately 1.9 cm short axis, previously 1 cm. Lungs/Pleura: Patchy areas of ground-glass attenuation have developed since 03/31/2018 also with some signs of septal thickening, basilar consolidation and small to moderate bilateral pleural effusions right greater than left Musculoskeletal: No signs of chest wall mass. Signs of median sternotomy for coronary revascularization similar to previous exams. CT ABDOMEN PELVIS FINDINGS Hepatobiliary: Limited assessment on noncontrast evaluation. No suspicious focal hepatic lesion. No signs of biliary ductal dilation or pericholecystic inflammation. Pancreas: Unremarkable. No pancreatic ductal dilatation or surrounding inflammatory changes. Spleen: Normal in size without focal abnormality. Adrenals/Urinary Tract:  Normal appearance of bilateral adrenal glands. Mild perinephric stranding bilaterally without signs of hydronephrosis. Similar to prior study. Presumed cyst arises from the lower pole of left kidney. Stomach/Bowel: No signs of acute gastrointestinal process. Scattered colonic diverticulosis favoring right hemicolon. Normal appearance of the appendix. Mild gastric thickening as before, not well evaluated. 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 Bilateral common iliac arterial dilation and aneurysmal caliber is also unchanged, as measured by this observer on the current and previous study. No signs of adenopathy in the upper abdomen or  retroperitoneum. No signs of pelvic lymphadenopathy. Reproductive: Prostate not well evaluated.  Grossly unremarkable. Other: Small volume ascites in the pelvis.  No signs of free air. Musculoskeletal: Sternotomy changes as above. No signs of acute musculoskeletal process. IMPRESSION: 1. Bilateral effusions right greater than left, associated with basilar consolidative changes and patchy airspace opacities as well as interstitial thickening. Could potentially represent a combination of heart failure in the background with an overlay of pneumonitis. Correlation with BNP may be helpful. 2. Enlarging lymph nodes in the chest may be indicative of metastatic disease though in the setting of potential heart failure and/or infection remaining nonspecific at this time. 3. Cardiac enlargement with signs of generalized atherosclerosis and coronary artery disease. 4. 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 5. Ascites further supporting the potential for cardiac dysfunction or volume overload. Electronically Signed   By: Zetta Bills M.D.   On: 11/04/2018 08:16   Ct Chest Wo Contrast  Result Date: 11/04/2018 CLINICAL DATA:  Gastric cancer, evaluate for metastatic disease, history of GI bleed. EXAM: CT CHEST, ABDOMEN AND PELVIS WITHOUT CONTRAST TECHNIQUE: Multidetector CT imaging of the chest, abdomen and pelvis was performed following the standard protocol without IV contrast. COMPARISON:  March 31, 2018 03/28/2018 FINDINGS: CT CHEST FINDINGS Cardiovascular: Extensive calcifications throughout the thoracic aorta with signs of calcified coronary artery disease similar to previous study. Heart size remains enlarged without pericardial effusion and without significant change from previous exam. Mild engorgement of central pulmonary vasculature. Mediastinum/Nodes: Scattered lymph nodes throughout the mediastinum, largest along the right paratracheal chain measuring 1.2 cm previously 0.8 cm. Subcarinal  lymph node also showing enlargement at approximately 1.9 cm short axis, previously 1 cm. Lungs/Pleura: Patchy areas of ground-glass attenuation have developed since 03/31/2018 also with some signs of septal thickening, basilar consolidation and small to moderate bilateral pleural effusions right greater than left Musculoskeletal: No signs of chest wall mass. Signs of median sternotomy for coronary revascularization similar to previous exams. CT ABDOMEN PELVIS FINDINGS Hepatobiliary: Limited assessment on noncontrast evaluation. No suspicious focal hepatic lesion. No signs of biliary ductal dilation or pericholecystic inflammation. Pancreas: Unremarkable. No pancreatic ductal dilatation or surrounding inflammatory changes. Spleen: Normal in size without focal abnormality. Adrenals/Urinary Tract: Normal appearance of bilateral adrenal glands. Mild perinephric stranding bilaterally without signs of hydronephrosis. Similar to prior study. Presumed cyst arises from the lower pole of left kidney. Stomach/Bowel: No signs of acute gastrointestinal process. Scattered colonic diverticulosis favoring right hemicolon. Normal appearance of the appendix. Mild gastric thickening as before, not well evaluated. 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 Bilateral common iliac arterial dilation and aneurysmal caliber is also unchanged, as measured by this observer on the current and previous study. No signs of adenopathy in the upper abdomen or retroperitoneum. No signs of pelvic lymphadenopathy. Reproductive: Prostate not well evaluated.  Grossly unremarkable.  Other: Small volume ascites in the pelvis.  No signs of free air. Musculoskeletal: Sternotomy changes as above. No signs of acute musculoskeletal process. IMPRESSION: 1. Bilateral effusions right greater than left, associated with basilar consolidative changes and patchy airspace opacities as  well as interstitial thickening. Could potentially represent a combination of heart failure in the background with an overlay of pneumonitis. Correlation with BNP may be helpful. 2. Enlarging lymph nodes in the chest may be indicative of metastatic disease though in the setting of potential heart failure and/or infection remaining nonspecific at this time. 3. Cardiac enlargement with signs of generalized atherosclerosis and coronary artery disease. 4. 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 5. Ascites further supporting the potential for cardiac dysfunction or volume overload. Electronically Signed   By: Zetta Bills M.D.   On: 11/04/2018 08:16    35 minutes with more than 50% spent in reviewing records, counseling patient and coordinating care.  Zakar Brosch T. [place]  If 7PM-7AM, please contact night-coverage [email or web address] Password [email or web address] 11/04/2018, 11:34 AM

## 2018-11-04 NOTE — TOC Initial Note (Signed)
Transition of Care [facility]) - Initial/Assessment Note    Patient Details  Name: Leonard [name] MRN: [id number] Date of Birth: 04-06-1950  Transition of Care [facility]) CM/SW Contact:    Dessa Phi, RN Phone Number: 11/04/2018, 12:58 PM  Clinical Narrative: [name] BKA.From [facility]TC facility-will return back by PTAR.                  Expected Discharge Plan: Long Term Nursing Home Barriers to Discharge: Continued Medical Work up   Patient Goals and CMS Choice   CMS Medicare.gov Compare Post Acute Care list provided to:: Patient    Expected Discharge Plan and Services Expected Discharge Plan: [place]         Expected Discharge Date: (unknown)                                    Prior Living Arrangements/Services   Lives with:: Facility Resident Patient language and need for interpreter reviewed:: Yes        Need for Family Participation in Patient Care: No (Comment) Care giver support system in place?: Yes (comment)   Criminal Activity/Legal Involvement Pertinent to Current Situation/Hospitalization: No - Comment as needed  Activities of Daily Living Home Assistive Devices/Equipment: Grab bars around toilet, Grab bars in shower, Hand-held shower hose, Hospital bed, Blood pressure cuff, Scales, Wheelchair([facility] has necessary equipment for the needs of their patients) ADL Screening (condition at time of admission) Patient's cognitive ability adequate to safely complete daily activities?: Yes Is the patient deaf or have difficulty hearing?: No Does the patient have difficulty seeing, even when wearing glasses/contacts?: No Does the patient have difficulty concentrating, remembering, or making decisions?: No Patient able to express need for assistance with ADLs?: Yes Does the patient have difficulty dressing or bathing?: Yes Independently performs ADLs?: No Communication: Independent Dressing (OT): Needs assistance Is this a change  from baseline?: Change from baseline, expected to last <3days Grooming: Needs assistance Is this a change from baseline?: Change from baseline, expected to last <3 days Feeding: Needs assistance Is this a change from baseline?: Change from baseline, expected to last <3 days Bathing: Needs assistance Is this a change from baseline?: Change from baseline, expected to last <3 days Toileting: Needs assistance Is this a change from baseline?: Change from baseline, expected to last <3 days In/Out Bed: Needs assistance Is this a change from baseline?: Change from baseline, expected to last <3 days Walks in Home: Needs assistance Is this a change from baseline?: Change from baseline, expected to last <3 days Does the patient have difficulty walking or climbing stairs?: Yes(secondary to weakness) Weakness of Legs: Both Weakness of Arms/Hands: None  Permission Sought/Granted Permission sought to share information with : Case Manager Permission granted to share information with : Yes, Verbal Permission Granted     Permission granted to share info w AGENCY: [facility] facility        Emotional Assessment Appearance:: Appears stated age Attitude/Demeanor/Rapport: Gracious Affect (typically observed): Accepting Orientation: : Oriented to Self, Oriented to Place, Oriented to  Time, Oriented to Situation Alcohol / Substance Use: Not Applicable Psych Involvement: No (comment)  Admission diagnosis:  Acute blood loss anemia [D62] Acute kidney injury ([place]) [N17.9] Anemia, unspecified type [D64.9] Patient Active Problem List   Diagnosis Date Noted  . Gastric adenocarcinoma ([place]) 09/18/2018  . Symptomatic anemia 07/20/2018  . Hematemesis/vomiting blood 07/15/2018  . Acute blood  loss anemia 07/15/2018  . Anemia due to chronic blood loss   . Upper GI bleed 06/23/2018  . Acute renal failure superimposed on chronic kidney disease ([place])   . Abdominal pain   . DNR (do not resuscitate)   .  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[place])   . DNR (do not resuscitate) discussion   . Anemia 03/29/2018  . Acute GI bleeding 03/28/2018  . Dysphagia 10/01/2017  . Palliative care by specialist   . Severe comorbid illness   . Moderate malnutrition ([place]) 08/27/2017  . Hypoxemia   . Atelectasis   . Chronic respiratory failure with hypoxia ([place]) 08/25/2017  . Pulmonary artery hypertension ([place]) 07/25/2017  . Pressure injury of skin 07/20/2017  . Goals of care, counseling/discussion 02/11/2017  . Diabetic polyneuropathy associated with type 2 diabetes mellitus ([place]) 01/14/2017  . Pleural effusion   . Chronic anemia 02/28/2016  . Weakness generalized 01/21/2016  . Diabetic retinopathy ([place]) 11/13/2015  . Iron deficiency anemia 08/03/2015  . Type II diabetes mellitus with complication ([place])   . Chronic kidney disease (CKD), stage III (moderate)   . MGUS (monoclonal gammopathy of unknown significance) 01/15/2015  . Deficiency anemia 01/15/2015  . CKD (chronic kidney disease) stage 4, GFR 15-29 ml/min (H[facility]) 01/15/2015  . Hypothyroidism 11/22/2014  . COPD GOLD III  02/16/2014  . Anemia of renal disease   . Chronic diastolic CHF (congestive heart failure) ([place])   . History of tobacco use 08/23/2013  . 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 CAD (coronary artery disease) 04/06/2013  . PVD - hx of Rt SFA PTA and s/p Rt 4-5th toe amp Dec 2014 04/05/2013  . 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 Mixed hyperlipidemia 07/17/2008   PCP:  Kathrene Alu, MD Pharmacy:  No Pharmacies Listed    Social Determinants of Health (SDOH) Interventions    Readmission Risk Interventions No flowsheet data found.

## 2018-11-04 NOTE — NC FL2 (Signed)
[place] LEVEL OF CARE SCREENING TOOL     IDENTIFICATION  [name] Birthdate: 04-Sep-1950 Sex: male Admission Date (Current Location): 11/01/2018  [place] and [place] Number:  Kathleen Argue [id number] [place] and Address:         Provider Number: [id number]  Attending Physician Name and Address:  Mercy Riding, MD  Relative Name and Phone Number:  Bennett Ram [address] [place]: Hospital Recommended Level of Care: Nursing Facility(Long Term Care) Prior Approval Number:    Date Approved/Denied:   PASRR Number: [id number]  Discharge Plan: Other (Comment)(Nursing Home-LTC)    Current Diagnoses: Patient Active Problem List   Diagnosis Date Noted  . 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 Symptomatic anemia 07/20/2018  . Hematemesis/vomiting blood 07/15/2018  . Acute blood loss anemia 07/15/2018  . Anemia due to chronic blood loss   . Upper GI bleed 06/23/2018  . Acute renal failure superimposed on chronic kidney disease ([place])   . Abdominal pain   . DNR (do not resuscitate)   . 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 DNR (do not resuscitate) discussion   . Anemia 03/29/2018  . Acute GI bleeding 03/28/2018  . Dysphagia 10/01/2017  . Palliative care by specialist   . Severe comorbid illness   . Moderate malnutrition ([place]) 08/27/2017  . Hypoxemia   . Atelectasis   . Chronic respiratory failure with hypoxia ([place]) 08/25/2017  . Pulmonary artery hypertension ([place]) 07/25/2017  . Pressure injury of skin 07/20/2017  . Goals of care, counseling/discussion 02/11/2017  . Diabetic polyneuropathy associated with type 2 diabetes mellitus ([place]) 01/14/2017  . Pleural effusion   . Chronic anemia 02/28/2016  . Weakness generalized 01/21/2016  . Diabetic retinopathy ([place]) 11/13/2015  . Iron deficiency anemia 08/03/2015  . Type II diabetes mellitus with complication ([place])   . Chronic kidney disease (CKD), stage III  (moderate)   . MGUS (monoclonal gammopathy of unknown significance) 01/15/2015  . Deficiency anemia 01/15/2015  . CKD (chronic kidney disease) stage 4, GFR 15-29 ml/min (HCC) 01/15/2015  . Hypothyroidism 11/22/2014  . COPD GOLD III  02/16/2014  . Anemia of renal disease   . Chronic diastolic CHF (congestive heart failure) ([place])   . History of tobacco use 08/23/2013  . 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 CAD (coronary artery disease) 04/06/2013  . PVD - hx of Rt SFA PTA and s/p Rt 4-5th toe amp Dec 2014 04/05/2013  . 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 Mixed hyperlipidemia 07/17/2008    Orientation RESPIRATION BLADDER Height & Weight     Self, Time, Situation, Place  Normal Incontinent(condom catheter) Weight: 83.9 kg Height:  5\' 8"  (172.7 cm)  BEHAVIORAL SYMPTOMS/MOOD NEUROLOGICAL BOWEL NUTRITION STATUS      Continent (Regular)  AMBULATORY STATUS COMMUNICATION OF NEEDS Skin   Extensive Assist(L BKA) Verbally Normal                       Personal Care Assistance Level of Assistance  Bathing, Feeding, Dressing, Total care Bathing Assistance: Limited assistance Feeding assistance: Limited assistance Dressing Assistance: Limited assistance Total Care Assistance: Limited assistance   Functional Limitations Info  Sight, Hearing, Speech Sight Info: Adequate Hearing Info: Adequate Speech Info: Adequate    SPECIAL CARE FACTORS FREQUENCY                       Contractures Contractures Info: Not present  Additional Factors Info  Code Status Code Status Info: full code Allergies Info: Penicillins           Current Medications (11/04/2018):  This is the current hospital active medication list Current Facility-Administered Medications  Medication Dose Route Frequency Provider Last Rate Last Dose  . 0.9 %  sodium chloride infusion (Manually program via Guardrails IV Fluids)   Intravenous Once Mercy Riding, MD   Stopped at 11/01/18 1750  . 0.9 %  sodium chloride  infusion  250 mL Intravenous PRN Wendee Beavers T, MD      . acetaminophen (TYLENOL) tablet 650 mg  650 mg Oral Q6H PRN Wendee Beavers T, MD   650 mg at 11/02/18 8127   Or  . acetaminophen (TYLENOL) suppository 650 mg  650 mg Rectal Q6H PRN Gonfa, Taye T, MD      . amLODipine (NORVASC) tablet 5 mg  5 mg Oral Daily Wendee Beavers T, MD   5 mg at 11/04/18 1018  . atorvastatin (LIPITOR) tablet 40 mg  40 mg Oral q1800 Wendee Beavers T, MD   40 mg at 11/03/18 1731  . carvedilol (COREG) tablet 12.5 mg  12.5 mg Oral BID WC Wendee Beavers T, MD   12.5 mg at 11/04/18 0815  . Chlorhexidine Gluconate Cloth 2 % PADS 6 each  6 each Topical Daily Mercy Riding, MD   6 each at 11/04/18 1026  . [START ON 11/06/2018] cloNIDine (CATAPRES - Dosed in mg/24 hr) patch 0.2 mg  0.2 mg Transdermal Weekly Gonfa, Taye T, MD      . doxazosin (CARDURA) tablet 8 mg  8 mg Oral Daily Wendee Beavers T, MD   8 mg at 11/04/18 1019  . ferrous sulfate tablet 325 mg  325 mg Oral Daily Wendee Beavers T, MD   325 mg at 11/04/18 1018  . hydrALAZINE (APRESOLINE) tablet 100 mg  100 mg Oral Q8H Wendee Beavers T, MD   100 mg at 11/03/18 2151  . insulin aspart (novoLOG) injection 0-9 Units  0-9 Units Subcutaneous Q4H Mercy Riding, MD   2 Units at 11/04/18 0558  . iohexol (OMNIPAQUE) 300 MG/ML solution 30 mL  30 mL Oral Once PRN Wendee Beavers T, MD      . isosorbide mononitrate (IMDUR) 24 hr tablet 120 mg  120 mg Oral Daily Gonfa, Taye T, MD   120 mg at 11/04/18 1018  . levothyroxine (SYNTHROID) tablet 100 mcg  100 mcg Oral QAC breakfast Wendee Beavers T, MD   100 mcg at 11/04/18 0600  . ondansetron (ZOFRAN) tablet 4 mg  4 mg Oral Q8H PRN Gonfa, Taye T, MD      . pantoprazole (PROTONIX) EC tablet 40 mg  40 mg Oral BID Wendee Beavers T, MD   40 mg at 11/04/18 1018  . polyethylene glycol (MIRALAX / GLYCOLAX) packet 17 g  17 g Oral Daily PRN Gonfa, Taye T, MD      . sodium chloride flush (NS) 0.9 % injection 3 mL  3 mL Intravenous PRN Gonfa, Taye T, MD      . sucralfate  (CARAFATE) tablet 1 g  1 g Oral TID WC & HS Gonfa, Taye T, MD   1 g at 11/04/18 1140  . torsemide (DEMADEX) tablet 40 mg  40 mg Oral Daily Wendee Beavers T, MD   40 mg at 11/04/18 1019   Facility-Administered Medications Ordered in Other Encounters  Medication Dose Route Frequency Provider Last Rate Last Dose  .  0.9 %  sodium chloride infusion (Manually program via Guardrails IV Fluids)  250 mL Intravenous Once Hayden Pedro, PA-C      . sodium chloride flush (NS) 0.9 % injection 10 mL  10 mL Intracatheter PRN Heath Lark, MD         Discharge Medications: Please see discharge summary for a list of discharge medications.  Relevant Imaging Results:  Relevant Lab Results:   Additional Information SS#[id number]  Meleny Tregoning, Juliann Pulse, RN

## 2018-11-04 NOTE — Telephone Encounter (Signed)
The patient's niece called and wanted [place] to know that he was having some problems hearing. I let her know that the nurses had placed messages to the primary team about that. It appears he will be having outpt follow up with general surgery to consider palliative gastrectomy. The family will make plans to follow up to have these conversations as well.

## 2018-11-04 NOTE — Progress Notes (Signed)
Patient ID: Leonard [name], male   DOB: [birth year], 68 y.o.   MRN: [id number]   Acute Care Surgery Service Progress Note:    Chief Complaint/Subjective: No c/o No n/v No abd pain   Objective: Vital signs in last 24 hours: Temp:  [98 F (36.7 C)-99.4 F (37.4 C)] 99.2 F (37.3 C) (10/01 1052) Pulse Rate:  [66-73] 70 (10/01 1052) Resp:  [16-20] 17 (10/01 1052) BP: (101-159)/(34-61) 159/58 (10/01 1052) SpO2:  [91 %-96 %] 94 % (10/01 1052) Weight:  [83.9 kg] 83.9 kg (10/01 0411)    Intake/Output from previous day: 09/30 0701 - 10/01 0700 In: 1051.7 [P.O.:720; IV Piggyback:331.7] Out: 1100 [Urine:1100] Intake/Output this shift: No intake/output data recorded.  Lungs: cta, nonlabored  Cardiovascular: reg  Abd: soft, nd, nt  Extremities: no edema, +SCDs  Neuro: alert, nonfocal  Lab Results: CBC  Recent Labs    11/03/18 0242 11/04/18 0458  WBC 5.9 5.0  HGB 7.3* 7.1*  HCT 23.4* 22.6*  PLT 86* 79*   BMET Recent Labs    11/03/18 0242 11/04/18 0458  NA 142 142  K 3.0* 3.5  CL 114* 114*  CO2 21* 20*  GLUCOSE 115* 114*  BUN 44* 48*  CREATININE 2.92* 2.77*  CALCIUM 7.8* 7.9*   LFT Hepatic Function Latest Ref Rng & Units 11/02/2018 07/21/2018 07/20/2018  Total Protein 6.5 - 8.1 g/dL 5.4(L) 5.7(L) 5.9(L)  Albumin 3.5 - 5.0 g/dL 3.0(L) 3.1(L) 3.1(L)  AST 15 - 41 U/L 14(L) 12(L) 13(L)  ALT 0 - 44 U/L _0 Alk Phosphatase 38 - 126 U/L 101 74 81  Total Bilirubin 0.3 - 1.2 mg/dL 1.2 0.2(L) 0.3  Bilirubin, Direct 0.0 - 0.2 mg/dL - - -   PT/INR No results for input(s): LABPROT, INR in the last 72 hours. ABG No results for input(s): PHART, HCO3 in the last 72 hours.  Invalid input(s): PCO2, PO2  Studies/Results:  Anti-infectives: Anti-infectives (From admission, onward)   None      Medications: Scheduled Meds: . sodium chloride   Intravenous Once  . amLODipine  5 mg Oral Daily  . atorvastatin  40 mg Oral q1800  . carvedilol  12.5 mg Oral BID WC   . Chlorhexidine Gluconate Cloth  6 each Topical Daily  . [START ON 11/06/2018] cloNIDine  0.2 mg Transdermal Weekly  . doxazosin  8 mg Oral Daily  . ferrous sulfate  325 mg Oral Daily  . hydrALAZINE  100 mg Oral Q8H  . insulin aspart  0-9 Units Subcutaneous Q4H  . isosorbide mononitrate  120 mg Oral Daily  . levothyroxine  100 mcg Oral QAC breakfast  . pantoprazole  40 mg Oral BID  . sucralfate  1 g Oral TID WC & HS  . torsemide  40 mg Oral Daily   Continuous Infusions: . sodium chloride     PRN Meds:.sodium chloride, acetaminophen **OR** acetaminophen, iohexol, ondansetron, polyethylene glycol, sodium chloride flush  Assessment/Plan: Patient Active Problem List   Diagnosis Date Noted  . 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 Symptomatic anemia 07/20/2018  . Hematemesis/vomiting blood 07/15/2018  . Acute blood loss anemia 07/15/2018  . Anemia due to chronic blood loss   . Upper GI bleed 06/23/2018  . Acute renal failure superimposed on chronic kidney disease ([place])   . Abdominal pain   . DNR (do not resuscitate)   . 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 DNR (do not resuscitate) discussion   . Anemia 03/29/2018  .  Acute GI bleeding 03/28/2018  . Dysphagia 10/01/2017  . Palliative care by specialist   . Severe comorbid illness   . Moderate malnutrition ([place]) 08/27/2017  . Hypoxemia   . Atelectasis   . Chronic respiratory failure with hypoxia ([place]) 08/25/2017  . Pulmonary artery hypertension ([place]) 07/25/2017  . Pressure injury of skin 07/20/2017  . Goals of care, counseling/discussion 02/11/2017  . Diabetic polyneuropathy associated with type 2 diabetes mellitus ([place]) 01/14/2017  . Pleural effusion   . Chronic anemia 02/28/2016  . Weakness generalized 01/21/2016  . Diabetic retinopathy ([place]) 11/13/2015  . Iron deficiency anemia 08/03/2015  . Type II diabetes mellitus with complication ([place])   . Chronic kidney disease (CKD), stage III (moderate)   . MGUS  (monoclonal gammopathy of unknown significance) 01/15/2015  . Deficiency anemia 01/15/2015  . CKD (chronic kidney disease) stage 4, GFR 15-29 ml/min (HCC) 01/15/2015  . Hypothyroidism 11/22/2014  . COPD GOLD III  02/16/2014  . Anemia of renal disease   . Chronic diastolic CHF (congestive heart failure) ([place])   . History of tobacco use 08/23/2013  . 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 CAD (coronary artery disease) 04/06/2013  . PVD - hx of Rt SFA PTA and s/p Rt 4-5th toe amp Dec 2014 04/05/2013  . 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 New ct's concerning for possbile pulm mets.  No sign of cont'd bleeding Will arrange an outpt f/u with Dr Barry Dienes to discuss palliative gastric resection. She may want new PET/CT to confirm pulm mets but appears that he failed radiation therapy. Only other option would be to send to IR for angio/embolization if cont to bleed.   Leighton Ruff. Redmond Pulling, MD, FACS General, Bariatric, & Minimally Invasive Surgery [facility]   LOS: 2 days

## 2018-11-05 LAB — CBC
HCT: 24.6 % — ABNORMAL LOW (ref 39.0–52.0)
Hemoglobin: 7.8 g/dL — ABNORMAL LOW (ref 13.0–17.0)
MCH: 30.5 pg (ref 26.0–34.0)
MCHC: 31.7 g/dL (ref 30.0–36.0)
MCV: 96.1 fL (ref 80.0–100.0)
Platelets: 82 10*3/uL — ABNORMAL LOW (ref 150–400)
RBC: 2.56 MIL/uL — ABNORMAL LOW (ref 4.22–5.81)
RDW: 17.1 % — ABNORMAL HIGH (ref 11.5–15.5)
WBC: 6.2 10*3/uL (ref 4.0–10.5)
nRBC: 0 % (ref 0.0–0.2)

## 2018-11-05 LAB — TYPE AND SCREEN
ABO/RH(D): B NEG
Antibody Screen: NEGATIVE
Unit division: 0
Unit division: 0
Unit division: 0
Unit division: 0

## 2018-11-05 LAB — BPAM RBC
Blood Product Expiration Date: 202010152359
Blood Product Expiration Date: 202010152359
Blood Product Expiration Date: 202010262359
Blood Product Expiration Date: 202010292359
ISSUE DATE / TIME: 202009281854
ISSUE DATE / TIME: 202009282229
ISSUE DATE / TIME: 202009290952
ISSUE DATE / TIME: 202010011031
Unit Type and Rh: 1700
Unit Type and Rh: 1700
Unit Type and Rh: 1700
Unit Type and Rh: 9500

## 2018-11-05 LAB — GLUCOSE, CAPILLARY
Glucose-Capillary: 101 mg/dL — ABNORMAL HIGH (ref 70–99)
Glucose-Capillary: 103 mg/dL — ABNORMAL HIGH (ref 70–99)
Glucose-Capillary: 110 mg/dL — ABNORMAL HIGH (ref 70–99)
Glucose-Capillary: 118 mg/dL — ABNORMAL HIGH (ref 70–99)
Glucose-Capillary: 127 mg/dL — ABNORMAL HIGH (ref 70–99)
Glucose-Capillary: 130 mg/dL — ABNORMAL HIGH (ref 70–99)
Glucose-Capillary: 183 mg/dL — ABNORMAL HIGH (ref 70–99)

## 2018-11-05 LAB — PH, BODY FLUID: pH, Body Fluid: 7.6

## 2018-11-05 LAB — PREPARE RBC (CROSSMATCH)

## 2018-11-05 LAB — LACTATE DEHYDROGENASE: LDH: 149 U/L (ref 98–192)

## 2018-11-05 LAB — HEMOGLOBIN AND HEMATOCRIT, BLOOD
HCT: 28 % — ABNORMAL LOW (ref 39.0–52.0)
Hemoglobin: 8.8 g/dL — ABNORMAL LOW (ref 13.0–17.0)

## 2018-11-05 LAB — MAGNESIUM: Magnesium: 1.9 mg/dL (ref 1.7–2.4)

## 2018-11-05 MED ORDER — SODIUM CHLORIDE 0.9% IV SOLUTION
Freq: Once | INTRAVENOUS | Status: AC
  Administered 2018-11-05: 16:00:00 via INTRAVENOUS

## 2018-11-05 NOTE — Progress Notes (Signed)
3 Days Post-Op    CC:  GI bleed  Subjective: Pt up in the chair, he only goes to the chair, he does not have a prosthesis.  No complaint or discomfort.    Objective: Vital signs in last 24 hours: Temp:  [98 F (36.7 C)-99.1 F (37.3 C)] 98.9 F (37.2 C) (10/02 0517) Pulse Rate:  [66-73] 68 (10/02 0835) Resp:  [18-20] 20 (10/02 0517) BP: (148-172)/(50-66) 153/50 (10/02 0835) SpO2:  [89 %-97 %] 96 % (10/02 0517) Weight:  [83.9 kg] 83.9 kg (10/01 1602) Last BM Date: 11/03/18 1180 PO 342 Blood - transfused 1522 urine Afebrile, VSS H/H 7.8/24.6 Intake/Output from previous day: 10/01 0701 - 10/02 0700 In: 4742 [P.O.:1180; Blood:342] Out: 1500 [Urine:1500] Intake/Output this shift: No intake/output data recorded.  General appearance: alert, cooperative and no distress Resp: clear to auscultation bilaterally GI: soft, non-tender; bowel sounds normal; no masses,  no organomegaly  Lab Results:  Recent Labs    11/04/18 0458 11/04/18 1712 11/05/18 0500  WBC 5.0  --  6.2  HGB 7.1* 8.3* 7.8*  HCT 22.6* 26.1* 24.6*  PLT 79*  --  82*    BMET Recent Labs    11/03/18 0242 11/04/18 0458  NA 142 142  K 3.0* 3.5  CL 114* 114*  CO2 21* 20*  GLUCOSE 115* 114*  BUN 44* 48*  CREATININE 2.92* 2.77*  CALCIUM 7.8* 7.9*   PT/INR No results for input(s): LABPROT, INR in the last 72 hours.  Recent Labs  Lab 11/02/18 0531 11/04/18 1712  AST 14* 13*  ALT 11 11  ALKPHOS 101 105  BILITOT 1.2 0.7  PROT 5.4* 5.4*  ALBUMIN 3.0* 2.8*     Lipase     Component Value Date/Time   LIPASE 45 07/15/2018 1455     Medications: . sodium chloride   Intravenous Once  . amLODipine  5 mg Oral Daily  . atorvastatin  40 mg Oral q1800  . carvedilol  12.5 mg Oral BID WC  . Chlorhexidine Gluconate Cloth  6 each Topical Daily  . [START ON 11/06/2018] cloNIDine  0.2 mg Transdermal Weekly  . doxazosin  8 mg Oral Daily  . ferrous sulfate  325 mg Oral Daily  . hydrALAZINE  100 mg Oral Q8H   . insulin aspart  0-9 Units Subcutaneous Q4H  . isosorbide mononitrate  120 mg Oral Daily  . levothyroxine  100 mcg Oral QAC breakfast  . pantoprazole  40 mg Oral BID  . sucralfate  1 g Oral TID WC & HS  . torsemide  40 mg Oral Daily   . 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 - info in AVS  Plan:  He has a follow up appointment with [name] in the AVS.  If he bleeds again before that we would recommend IR see and evaluate for ablation of the bleeding site.  We will be available, please call if we can help.    LOS: 3 days    Idamae Coccia 11/05/2018 [phone]

## 2018-11-05 NOTE — Progress Notes (Signed)
PROGRESS NOTE  Leonard [name] MCN:[id number] DOB: 05-Jun-1950   PCP: Kathrene Alu, MD  Patient is from: Home  DOA: 11/01/2018 LOS: 3  Brief Narrative / Interim history: 68 year old male with history of HTN, HLD, DM-2, CAD, PVD/left AKA, CKD-4, COPD, anemia of chronic disease and recent diagnosis of gastric adenocarcinoma s/p radiation as currently deemed non-chemo or surgical candidate presenting with symptomatic anemia with Hgb down to 4.7.  Received multiple transfusions intermittently.  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  General surgery consulted as well.  CT chest/abdomen/pelvis on 11/03/2018 revealed bilateral mild-to-moderate pleural effusion, right greater than left, with basilar consolidative changes, patchy airway opacities and interstitial thickening,  enlarged lymph nodes in his chest and cardiac enlargement.  He had ultrasound-guided thoracocentesis of right pleural effusion with removal of 1 L transudative fluid.  Cultures negative so far.  At this time, plan is discharge to SNF once H&H is a stable.  Patient has follow-up appointment with general surgery, [name] for possible palliative radiation therapy of his gastric adenocarcinoma.   Subjective: No major events overnight of this morning.  Has no complaint this morning.  Sleepy but arouses to voice easily.  Some difficulty hearing.  Denies chest pain, dyspnea, nausea, vomiting or abdominal pain.  Hemoglobin slightly dropped to 7.8 this morning.  Objective: Vitals:   11/04/18 2037 11/04/18 2040 11/05/18 0517 11/05/18 0835  BP: (!) 156/63  (!) 164/65 (!) 153/50  Pulse: 66  66 68  Resp: 18  20   Temp: 98 F (36.7 C)  98.9 F (37.2 C)   TempSrc:   Oral   SpO2: (!) 89% 97% 96%   Weight:      Height:        Intake/Output Summary (Last 24 hours) at 11/05/2018 1442 Last data filed at 11/05/2018 0151 Gross per 24 hour   Intake 700 ml  Output 1500 ml  Net -800 ml   Filed Weights   11/03/18 0500 11/04/18 0411 11/04/18 1602  Weight: 84 kg 83.9 kg 83.9 kg    Examination:  GENERAL: No acute distress.  Sleepy but arises to voice easily. HEENT: MMM.  Vision and hearing grossly intact.  NECK: Supple.  No apparent JVD.  RESP:  No IWOB. Good air movement bilaterally. CVS:  RRR. Heart sounds normal.  ABD/GI/GU: Bowel sounds present. Soft. Non tender.  MSK/EXT: Left AKA. SKIN: no apparent skin lesion or wound NEURO: Sleepy but arises to voice easily.  Fairly oriented.  No focal neuro deficit. PSYCH: Calm. Normal affect.   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  -Continue by mouth Carafate -Change IV Protonix to p.o. -Monitor H&H.  Transfuse for Hgb less than 8.  Has history of CAD -PT/OT eval-SNF  Gastric adenocarcinoma s/p radiation as currently deemed non-chemo or surgical candidate  -EGD as above. -CT chest/abdomen/pelvis reveals bilateral pleural effusion and intrathoracic LAD concerning for metastasis. -General surgery to arrange outpatient follow-up with [name] to discuss palliative radiation  Mild to moderate bilateral pleural effusion, right greater than left.  Patient does not have cardiopulmonary symptoms.  Differential diagnosis includes CHF, metastatic cancer or pulmonary infection.  Last echo in 2019 with normal EF but moderate to severe  LVH. He has no fever, leukocytosis or respiratory symptoms.  Had thoracocentesis with removal of 1 L transudative fluid.  Culture negative. -Continue torsemide.  History of CAD/CABG in 2015: No anginal symptoms. -Continue home medications  Chronic COPD: Stable.  No  cardiopulmonary symptoms. -Continue home medications and PRN nebulizer  CKD-4 with mild azotemia and non-anion gap metabolic acidosis: Stable. -Continue monitoring  Hypokalemia: Likely due to diuretics. -Replenish and recheck  Essential hypertension: Normotensive -Continue amlodipine, Coreg, clonidine and furosemide.  DM-2 with renal complication.  CBG was in fair range. -Continue current regimen.  Hypothyroidism -Continue home Synthroid  Thrombocytopenia: Platelet 82-stable.  DVT prophylaxis: SCD due to GI bleed and thrombocytopenia Code Status: Full code Family Communication: Updated patient's sister over the phone on 10/1. Disposition Plan: Anticipate discharge in the next 24 to 48 hours if H&H and electrolytes stable. Consultants: Gastroenterology, general surgery  Procedures:  EGD on 9/29  Thoracocentesis on 10/1  Microbiology summarized: -COVID-19 screen negative. -MRSA PCR negative.  Antimicrobials: Anti-infectives (From admission, onward)   None      Sch Meds:  Scheduled Meds: . sodium chloride   Intravenous Once  . sodium chloride   Intravenous Once  . amLODipine  5 mg Oral Daily  . atorvastatin  40 mg Oral q1800  . carvedilol  12.5 mg Oral BID WC  . Chlorhexidine Gluconate Cloth  6 each Topical Daily  . [START ON 11/06/2018] cloNIDine  0.2 mg Transdermal Weekly  . doxazosin  8 mg Oral Daily  . ferrous sulfate  325 mg Oral Daily  . hydrALAZINE  100 mg Oral Q8H  . insulin aspart  0-9 Units Subcutaneous Q4H  . isosorbide mononitrate  120 mg Oral Daily  . levothyroxine  100 mcg Oral QAC breakfast  . pantoprazole  40 mg Oral BID  . sucralfate  1 g Oral TID WC & HS  . torsemide  40 mg Oral Daily   Continuous Infusions: . sodium chloride     PRN Meds:.sodium chloride, acetaminophen **OR** acetaminophen, iohexol, ondansetron, polyethylene glycol, sodium chloride flush   I have personally reviewed the following labs and images: CBC: Recent Labs  Lab  11/01/18 1613 11/02/18 0531 11/02/18 1516 11/03/18 0242 11/04/18 0458 11/04/18 1712 11/05/18 0500  WBC 4.1 5.3  --  5.9 5.0  --  6.2  NEUTROABS 2.6  --   --  4.1  --   --   --   HGB 4.5* 6.5* 7.8* 7.3* 7.1* 8.3* 7.8*  HCT 14.8* 20.2* 23.7* 23.4* 22.6* 26.1* 24.6*  MCV 98.7 95.7  --  95.1 96.2  --  96.1  PLT 87* 88*  --  86* 79*  --  82*   BMP &GFR Recent Labs  Lab 11/01/18 1613 11/02/18 0531 11/03/18 0242 11/04/18 0458 11/05/18 0500  NA 143 144 142 142  --   K 3.2* 3.0* 3.0* 3.5  --   CL 112* 113* 114* 114*  --   CO2 19* 21* 21* 20*  --   GLUCOSE 242* 135* 115* 114*  --   BUN 49* 48* 44* 48*  --   CREATININE 2.85* 2.69* 2.92* 2.77*  --   CALCIUM 8.1* 8.0* 7.8* 7.9*  --   MG  --   --   --  2.0 1.9   Estimated Creatinine Clearance: 27.3 mL/min (A) (by C-G formula based on SCr of 2.77 mg/dL (H)). Liver & Pancreas: Recent Labs  Lab 11/02/18 0531 11/04/18 1712  AST 14* 13*  ALT 11 11  ALKPHOS 101 105  BILITOT 1.2 0.7  PROT 5.4* 5.4*  ALBUMIN 3.0* 2.8*   No results for input(s): LIPASE, AMYLASE in the last 168 hours. No results for input(s): AMMONIA in the last 168 hours. Diabetic: No results for input(s): HGBA1C in the last 72 hours. Recent Labs  Lab 11/05/18 0016 11/05/18 0501 11/05/18 0747 11/05/18 1158 11/05/18 1441  GLUCAP 101* 103* 110* 127* 183*   Cardiac Enzymes: No results for input(s): CKTOTAL, CKMB, CKMBINDEX, TROPONINI in the last 168 hours. No results for input(s): PROBNP in the last 8760 hours. Coagulation Profile: No results for input(s): INR, PROTIME in the last 168 hours. Thyroid Function Tests: No results for input(s): TSH, T4TOTAL, FREET4, T3FREE, THYROIDAB in the last 72 hours. Lipid Profile: No results for input(s): CHOL, HDL, LDLCALC, TRIG, CHOLHDL, LDLDIRECT in the last 72 hours. Anemia Panel: Recent Labs    11/02/18 1516  VITAMINB12 263  FOLATE 4.6*  FERRITIN 1,065*  TIBC 234*  IRON 46  RETICCTPCT 4.0*   Urine analysis:     Component Value Date/Time   COLORURINE YELLOW 06/24/2018 0628   APPEARANCEUR CLOUDY (A) 06/24/2018 0628   LABSPEC 1.012 06/24/2018 0628   PHURINE 7.0 06/24/2018 0628   GLUCOSEU NEGATIVE 06/24/2018 0628   HGBUR NEGATIVE 06/24/2018 0628   BILIRUBINUR NEGATIVE 06/24/2018 0628   BILIRUBINUR NEG 06/26/2015 1540   KETONESUR NEGATIVE 06/24/2018 0628   PROTEINUR 100 (A) 06/24/2018 0628   UROBILINOGEN 1.0 06/26/2015 1540   UROBILINOGEN 0.2 2020/11/1413 2156   NITRITE NEGATIVE 06/24/2018 0628   LEUKOCYTESUR LARGE (A) 06/24/2018 0628   Sepsis Labs: Invalid input(s): PROCALCITONIN, [place]  Microbiology: Recent Results (from the past 240 hour(s))  SARS CORONAVIRUS 2 (TAT 6-24 HRS) Nasopharyngeal Nasopharyngeal Swab     Status: None   Collection Time: 11/01/18  7:19 PM   Specimen: Nasopharyngeal Swab  Result Value Ref Range Status   SARS Coronavirus 2 NEGATIVE NEGATIVE Final    Comment: (NOTE) SARS-CoV-2 target nucleic acids are NOT DETECTED. The SARS-CoV-2 RNA is generally detectable in upper and lower respiratory specimens during the acute phase of infection. Negative results do not preclude SARS-CoV-2 infection, do not rule out co-infections with other pathogens, and should not be used as the sole basis for treatment or other patient management decisions. Negative results must be combined with clinical observations, patient history, and epidemiological information. The expected result is Negative. Fact Sheet for Patients: SugarRoll.be Fact Sheet for Healthcare Providers: https://www.woods-mathews.com/ This test is not yet approved or cleared by the [place] FDA and  has been authorized for detection and/or diagnosis of SARS-CoV-2 by FDA under an Emergency Use Authorization (EUA). This EUA will remain  in effect (meaning this test can be used) for the duration of the COVID-19 declaration under Section 56 4(b)(1) of the Act, 21  U.S.C. section 360bbb-3(b)(1), unless the authorization is terminated or revoked sooner. Performed at [facility], [place] [address], [place], [place] [address]   Body fluid culture     Status: None (Preliminary result)   Collection Time: 11/04/18  4:14 PM   Specimen: PATH Cytology Pleural fluid  Result Value Ref Range Status   Specimen Description   Final    PLEURAL RIGHT Performed at [facility], [place] [address], [place], [place] [address]    Special Requests   Final    NONE Performed at [facility], [place] [address] [place], [place] [address]    Gram Stain   Final  RARE WBC PRESENT, PREDOMINANTLY MONONUCLEAR NO ORGANISMS SEEN    Culture   Final    NO GROWTH < 24 HOURS Performed at [place] [address], [place], [place] [address]    Report Status PENDING  Incomplete    Radiology Studies: Dg Chest 1 View  Result Date: 11/04/2018 CLINICAL DATA:  Status post right-sided thoracentesis. EXAM: CHEST  1 VIEW COMPARISON:  CT, 11/03/2018.  Radiographs, 06/23/2018. FINDINGS: Right pleural effusion is decreased following thoracentesis. There is no pneumothorax. Left pleural effusion persists. There is hazy bilateral airspace lung opacities similar to the prior chest CT. No new lung abnormalities. Stable changes from prior CABG surgery. IMPRESSION: 1. Decreased right pleural effusion following thoracentesis. No pneumothorax or evidence of a procedure complication. Electronically Signed   By: Lajean Manes M.D.   On: 11/04/2018 16:45   US Thoracentesis Asp Pleural Space W/img Guide  Result Date: 11/04/2018 INDICATION: Patient with history of gastric cancer with recent GI bleed, coronary artery disease with CABG in 2015, COPD, chronic kidney disease, bilateral pleural effusions right greater than left. Request received for diagnostic and therapeutic right thoracentesis. EXAM: ULTRASOUND GUIDED DIAGNOSTIC AND THERAPEUTIC RIGHT  THORACENTESIS MEDICATIONS: None COMPLICATIONS: None immediate. PROCEDURE: An ultrasound guided thoracentesis was thoroughly discussed with the patient and questions answered. The benefits, risks, alternatives and complications were also discussed. The patient understands and wishes to proceed with the procedure. Written consent was obtained. Ultrasound was performed to localize and mark an adequate pocket of fluid in the right chest. The area was then prepped and draped in the normal sterile fashion. 1% Lidocaine was used for local anesthesia. Under ultrasound guidance a 6 Fr Safe-T-Centesis catheter was introduced. Thoracentesis was performed. The catheter was removed and a dressing applied. FINDINGS: A total of approximately 1 liter of hazy, amber fluid was removed. Samples were sent to the laboratory as requested by the clinical team. IMPRESSION: Successful ultrasound guided diagnostic and therapeutic right thoracentesis yielding 1 liter of pleural fluid. Read by: Rowe Robert, PA-C Electronically Signed   By: Constance Holster M.D.   On: 11/04/2018 16:35    Dynesha Woolen T. Trumbull  If 7PM-7AM, please contact night-coverage [email or web address] Password [email or web address] 11/05/2018, 2:42 PM

## 2018-11-05 NOTE — Plan of Care (Signed)
  Problem: Clinical Measurements: Goal: Will remain free from infection Outcome: Progressing Goal: Respiratory complications will improve Outcome: Progressing Goal: Cardiovascular complication will be avoided Outcome: Progressing   Problem: Nutrition: Goal: Adequate nutrition will be maintained Outcome: Progressing   Problem: Coping: Goal: Level of anxiety will decrease Outcome: Progressing   Problem: Safety: Goal: Ability to remain free from injury will improve Outcome: Progressing   Problem: Skin Integrity: Goal: Risk for impaired skin integrity will decrease Outcome: Progressing   

## 2018-11-05 NOTE — Care Management Important Message (Signed)
Important Message  Patient Details IM Letter given to Dessa Phi RN to present to the [facility] Name: Leonard [name] MRN: [id number] Date of Birth: August 24, 1950   Medicare Important Message Given:  Yes     Kerin Salen 11/05/2018, 12:10 PM

## 2018-11-05 NOTE — Evaluation (Signed)
Occupational Therapy Evaluation Patient Details Name: Leonard [name] MRN: [id number] DOB: [birth year] Today's Date: 11/05/2018    History of Present Illness 68 year old male with history of HTN, HLD, DM-2, CAD, PVD/left AKA, CKD-4, COPD, anemia of chronic disease and recent diagnosis of gastric adenocarcinoma s/p radiation as currently deemed non-chemo or surgical candidate presenting with symptomatic anemia with Hgb down to 4.7.   Clinical Impression   Pt admitted with the above diagnoses and presents with below problem list. PTA pt was transferring into his w/c and completing bathing/dressing tasks with "not much" help. Pt is currently setup to min A with ADLs. Pt appears to be close to his baseline with ADLs and lateral scoot transfers. Will follow acutely.      Follow Up Recommendations  Supervision/Assistance - 24 hour;Other (comment)(Back to skilled care facility)    Equipment Recommendations  None recommended by OT    Recommendations for Other Services       Precautions / Restrictions Precautions Precautions: Fall Restrictions Weight Bearing Restrictions: No      Mobility Bed Mobility Overal bed mobility: Needs Assistance Bed Mobility: Supine to Sit     Supine to sit: Min guard;HOB elevated     General bed mobility comments: no physical assist. min guard for safety  Transfers Overall transfer level: Needs assistance   Transfers: Lateral/Scoot Transfers          Lateral/Scoot Transfers: Min guard General transfer comment: elevated EOB height to drop arm recliner. min guard for safety    Balance Overall balance assessment: Needs assistance Sitting-balance support: Single extremity supported;Bilateral upper extremity supported Sitting balance-Leahy Scale: Fair                                     ADL either performed or assessed with clinical judgement   ADL Overall ADL's : Needs assistance/impaired Eating/Feeding: Set up;Sitting    Grooming: Set up;Sitting;Supervision/safety   Upper Body Bathing: Sitting;Set up;Supervision/ safety   Lower Body Bathing: Set up;Supervison/ safety;Sitting/lateral leans   Upper Body Dressing : Set up;Sitting;Supervision/safety   Lower Body Dressing: Supervision/safety;Set up;Sitting/lateral leans   Toilet Transfer: Min Psychiatric nurse Details (indicate cue type and reason): lateral transfer EOB>drop arm recliner. min guard for safety Toileting- Clothing Manipulation and Hygiene: Set up;Supervision/safety;Sitting/lateral lean   Tub/ Shower Transfer: Min guard     General ADL Comments: Lateral scoot transfer to drop arm seating +1 min guard. Close to baseline with ADLs     Vision         Perception     Praxis      Pertinent Vitals/Pain Pain Assessment: Faces Faces Pain Scale: Hurts a little bit Pain Location: mild grimacing with movement Pain Intervention(s): Monitored during session     Hand Dominance Right   Extremity/Trunk Assessment Upper Extremity Assessment Upper Extremity Assessment: Generalized weakness;Overall [facility] for tasks assessed   Lower Extremity Assessment Lower Extremity Assessment: Defer to PT evaluation       Communication Communication Communication: HOH   Cognition Arousal/Alertness: Awake/alert;Lethargic(sleepy) Behavior During Therapy: WFL for tasks assessed/performed Overall Cognitive Status: Within Functional Limits for tasks assessed                                 General Comments: "[name]"    General Comments       Exercises     Shoulder Instructions  Home Living Family/patient expects to be discharged to:: Skilled nursing facility                                 Additional Comments: from [facility]      Prior Functioning/Environment Level of Independence: Needs assistance  Gait / Transfers Assistance Needed: Requiring assist for transfers at SNF ADL's / Homemaking Assistance  Needed: reports he does not need much help with bathing/dressing, likely setup-supervision            OT Problem List: Decreased strength;Decreased activity tolerance;Impaired balance (sitting and/or standing);Decreased knowledge of use of DME or AE;Decreased knowledge of precautions;Pain      OT Treatment/Interventions: Self-care/ADL training;Therapeutic exercise;Energy conservation;DME and/or AE instruction;Therapeutic activities;Patient/family education;Balance training    OT Goals(Current goals can be found in the care plan section) Acute Rehab OT Goals Patient Stated Goal: not stated OT Goal Formulation: With patient Time For Goal Achievement: 11/19/18 Potential to Achieve Goals: Good ADL Goals Pt Will Perform Lower Body Bathing: with modified independence;sitting/lateral leans;with set-up Pt Will Perform Lower Body Dressing: with modified independence;sitting/lateral leans;with set-up Additional ADL Goal #1: Pt will sit EOB at supervision level for 10 minutes to complete ADL tasks.  OT Frequency: Min 2X/week   Barriers to D/C:            Co-evaluation PT/OT/SLP Co-Evaluation/Treatment: Yes Reason for Co-Treatment: To address functional/ADL transfers;For patient/therapist safety   OT goals addressed during session: ADL's and self-care      AM-PAC OT "6 Clicks" Daily Activity     Outcome Measure Help from another person eating meals?: None Help from another person taking care of personal grooming?: None Help from another person toileting, which includes using toliet, bedpan, or urinal?: A Little Help from another person bathing (including washing, rinsing, drying)?: A Little Help from another person to put on and taking off regular upper body clothing?: None Help from another person to put on and taking off regular lower body clothing?: A Little 6 Click Score: 21   End of Session Nurse Communication: Mobility status  Activity Tolerance: Patient tolerated treatment  well Patient left: in chair;with call bell/phone within reach;with chair alarm set  OT Visit Diagnosis: Muscle weakness (generalized) (M62.81);Pain                Time: 1045-1056 OT Time Calculation (min): 11 min Charges:  OT General Charges $OT Visit: 1 Visit OT Evaluation $OT Eval Low Complexity: [place], OT Acute Rehabilitation Services Pager: [phone] Office: [phone]   Hortencia Pilar 11/05/2018, 11:18 AM

## 2018-11-05 NOTE — Evaluation (Signed)
Physical Therapy One Time Evaluation Patient Details Name: Leonard [name] MRN: [id number] DOB: December 21, 1950 Today's Date: 11/05/2018   History of Present Illness  68 year old male with history of HTN, HLD, DM-2, CAD, PVD/left AKA, CKD-4, COPD, anemia of chronic disease and recent diagnosis of gastric adenocarcinoma s/p radiation as currently deemed non-chemo or surgical candidate presenting with symptomatic anemia with Hgb down to 4.7.  Clinical Impression  Patient evaluated by Physical Therapy with no further acute PT needs identified. All education has been completed and the patient has no further questions.  Pt assisted OOB to recliner and min/guard assist for transfer today.  Pt reports being near baseline in regards to mobility.  Pt from SNF so will defer rehab needs to SNF. See below for any follow-up Physical Therapy or equipment needs. PT is signing off. Thank you for this referral.     Follow Up Recommendations SNF(from SNF)    Equipment Recommendations  None recommended by PT    Recommendations for Other Services       Precautions / Restrictions Precautions Precautions: Fall Restrictions Weight Bearing Restrictions: No      Mobility  Bed Mobility Overal bed mobility: Needs Assistance Bed Mobility: Supine to Sit     Supine to sit: Min guard;HOB elevated     General bed mobility comments: no physical assist. min guard for safety  Transfers Overall transfer level: Needs assistance Equipment used: None Transfers: Squat Pivot Transfers     Squat pivot transfers: Min guard    Lateral/Scoot Transfers: Min guard General transfer comment: pt utilized armrests to self assist, pt didn't fully stand however didn't scoot or slide over, min/guard for safety, pt reports feeling transfer was baseline  Ambulation/Gait                Stairs            Wheelchair Mobility    Modified Rankin (Stroke Patients Only)       Balance Overall balance  assessment: Needs assistance Sitting-balance support: Single extremity supported;Bilateral upper extremity supported Sitting balance-Leahy Scale: Fair                                       Pertinent Vitals/Pain Pain Assessment: Faces Faces Pain Scale: Hurts a little bit Pain Location: mild grimacing with movement Pain Descriptors / Indicators: Grimacing Pain Intervention(s): Repositioned;Monitored during session    Home Living Family/patient expects to be discharged to:: Skilled nursing facility                 Additional Comments: from [facility]    Prior Function Level of Independence: Needs assistance   Gait / Transfers Assistance Needed: Requiring assist for transfers at SNF  ADL's / Homemaking Assistance Needed: reports he does not need much help with bathing/dressing, likely setup-supervision        Hand Dominance   Dominant Hand: Right    Extremity/Trunk Assessment   Upper Extremity Assessment Upper Extremity Assessment: Generalized weakness;Overall [facility] for tasks assessed    Lower Extremity Assessment Lower Extremity Assessment: Overall WFL for tasks assessed;LLE deficits/detail LLE Deficits / Details: L AKA       Communication   Communication: HOH  Cognition Arousal/Alertness: Awake/alert(sleepy) Behavior During Therapy: WFL for tasks assessed/performed Overall Cognitive Status: Within Functional Limits for tasks assessed  General Comments: "[place]"       General Comments      Exercises     Assessment/Plan    PT Assessment Patent does not need any further PT services  PT Problem List         PT Treatment Interventions      PT Goals (Current goals can be found in the Care Plan section)  Acute Rehab PT Goals Patient Stated Goal: not stated PT Goal Formulation: All assessment and education complete, DC therapy    Frequency     Barriers to discharge         Co-evaluation PT/OT/SLP Co-Evaluation/Treatment: Yes Reason for Co-Treatment: For patient/therapist safety;To address functional/ADL transfers PT goals addressed during session: Mobility/safety with mobility OT goals addressed during session: ADL's and self-care       AM-PAC PT "6 Clicks" Mobility  Outcome Measure Help needed turning from your back to your side while in a flat bed without using bedrails?: None Help needed moving from lying on your back to sitting on the side of a flat bed without using bedrails?: None Help needed moving to and from a bed to a chair (including a wheelchair)?: None Help needed standing up from a chair using your arms (e.g., wheelchair or bedside chair)?: A Little Help needed to walk in hospital room?: A Little Help needed climbing 3-5 steps with a railing? : Total 6 Click Score: 19    End of Session   Activity Tolerance: Patient tolerated treatment well Patient left: in chair;with chair alarm set;with call bell/phone within reach Nurse Communication: Mobility status PT Visit Diagnosis: Other abnormalities of gait and mobility (R26.89)    Time: 4462-8638 PT Time Calculation (min) (ACUTE ONLY): 15 min   Charges:   PT Evaluation $PT Eval Low Complexity: Leonard [name], PT, DPT Acute Rehabilitation Services Office: [phone] Pager: [phone]  Leonard [name] E 11/05/2018, 1:16 PM

## 2018-11-06 LAB — TYPE AND SCREEN
ABO/RH(D): B NEG
Antibody Screen: NEGATIVE
Unit division: 0

## 2018-11-06 LAB — BASIC METABOLIC PANEL
Anion gap: 9 (ref 5–15)
BUN: 49 mg/dL — ABNORMAL HIGH (ref 8–23)
CO2: 20 mmol/L — ABNORMAL LOW (ref 22–32)
Calcium: 7.6 mg/dL — ABNORMAL LOW (ref 8.9–10.3)
Chloride: 113 mmol/L — ABNORMAL HIGH (ref 98–111)
Creatinine, Ser: 2.81 mg/dL — ABNORMAL HIGH (ref 0.61–1.24)
GFR calc Af Amer: 26 mL/min — ABNORMAL LOW (ref >60)
GFR calc non Af Amer: 22 mL/min — ABNORMAL LOW (ref >60)
Glucose, Bld: 114 mg/dL — ABNORMAL HIGH (ref 70–99)
Potassium: 3.3 mmol/L — ABNORMAL LOW (ref 3.5–5.1)
Sodium: 142 mmol/L (ref 135–145)

## 2018-11-06 LAB — PHOSPHORUS: Phosphorus: 3.7 mg/dL (ref 2.5–4.6)

## 2018-11-06 LAB — CBC
HCT: 28 % — ABNORMAL LOW (ref 39.0–52.0)
Hemoglobin: 8.6 g/dL — ABNORMAL LOW (ref 13.0–17.0)
MCH: 29.8 pg (ref 26.0–34.0)
MCHC: 30.7 g/dL (ref 30.0–36.0)
MCV: 96.9 fL (ref 80.0–100.0)
Platelets: 79 10*3/uL — ABNORMAL LOW (ref 150–400)
RBC: 2.89 MIL/uL — ABNORMAL LOW (ref 4.22–5.81)
RDW: 16.6 % — ABNORMAL HIGH (ref 11.5–15.5)
WBC: 5.3 10*3/uL (ref 4.0–10.5)
nRBC: 0 % (ref 0.0–0.2)

## 2018-11-06 LAB — BPAM RBC
Blood Product Expiration Date: 202010302359
ISSUE DATE / TIME: 202010021521
Unit Type and Rh: 1700

## 2018-11-06 LAB — GLUCOSE, CAPILLARY
Glucose-Capillary: 104 mg/dL — ABNORMAL HIGH (ref 70–99)
Glucose-Capillary: 117 mg/dL — ABNORMAL HIGH (ref 70–99)
Glucose-Capillary: 138 mg/dL — ABNORMAL HIGH (ref 70–99)
Glucose-Capillary: 143 mg/dL — ABNORMAL HIGH (ref 70–99)
Glucose-Capillary: 150 mg/dL — ABNORMAL HIGH (ref 70–99)
Glucose-Capillary: 161 mg/dL — ABNORMAL HIGH (ref 70–99)

## 2018-11-06 LAB — MAGNESIUM: Magnesium: 1.9 mg/dL (ref 1.7–2.4)

## 2018-11-06 LAB — NOVEL CORONAVIRUS, NAA (HOSP ORDER, SEND-OUT TO REF LAB; TAT 18-24 HRS): SARS-CoV-2, NAA: NOT DETECTED

## 2018-11-06 MED ORDER — POTASSIUM CHLORIDE CRYS ER 20 MEQ PO TBCR
40.0000 meq | EXTENDED_RELEASE_TABLET | Freq: Once | ORAL | Status: AC
  Administered 2018-11-06: 10:00:00 40 meq via ORAL
  Filled 2018-11-06: qty 2

## 2018-11-06 MED ORDER — SUCRALFATE 1 G PO TABS: 1.0000 g | ORAL_TABLET | Freq: Three times a day (TID) | ORAL | 0 refills | Status: AC

## 2018-11-06 MED ORDER — FOLIC ACID 1 MG PO TABS: 1.0000 mg | ORAL_TABLET | Freq: Every day | ORAL | 0 refills | Status: AC

## 2018-11-06 NOTE — Discharge Summary (Signed)
Physician Discharge Summary  Leonard [name] OEV:[id number] DOB: [birth year] DOA: 11/01/2018  PCP: Kathrene Alu, MD  Admit date: 11/01/2018 Discharge date: 11/06/2018  Admitted From: SNF Disposition: SNF  Recommendations for Outpatient Follow-up:  1. Follow up with PCP and general surgery in 1-2 weeks 2. Please obtain CBC/BMP/Mag at follow up 3. Please follow up on the following pending results: None  Home Health: Not applicable Equipment/Devices: Not applicable  Discharge Condition: Stable CODE STATUS: Full code  Hospital Course: 68 year old male with history of HTN, HLD, DM-2, CAD, PVD/left AKA, CKD-4, COPD, anemia of chronic disease and recent diagnosis of gastric adenocarcinoma s/p radiation as currently deemed non-chemo or surgical candidate presenting with symptomatic anemia with Hgb down to 4.7.  Received 4 units throughout his hospitalization. 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  General surgery consulted as well.  CT chest/abdomen/pelvis on 11/03/2018 revealed bilateral mild-to-moderate pleural effusion, right greater than left, with basilar consolidative changes, patchy airway opacities and interstitial thickening,  enlarged lymph nodes in his chest and cardiac enlargement.  He had ultrasound-guided thoracocentesis of right pleural effusion with removal of 1 L transudative fluid.  Cultures negative so far.  Patient is already on furosemide.  Has no cardiopulmonary symptoms.  On the day of discharge, H&H remained stable.  Patient will follow up with general surgery, [name] for possible palliative radiation therapy of his gastric adenocarcinoma.   See individual problem list below for more on hospital course.  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  -Continue by mouth Carafate and Protonix. -Recheck CBC in 1 week. -Outpatient follow-up with general surgery for possible palliative radiation therapy of his gastric adenocarcinoma.  Gastric adenocarcinoma s/p radiation as currently deemed non-chemo or surgical candidate  -EGD as above. -CT chest/abdomen/pelvis reveals bilateral pleural effusion and intrathoracic LAD concerning for metastasis. -General surgery to arrange outpatient follow-up with [name] to discuss palliative radiation  Mild to moderate bilateral pleural effusion, right greater than left.  Patient does not have cardiopulmonary symptoms.  Differential diagnosis includes CHF, metastatic cancer or pulmonary infection.  Last echo in 2019 with normal EF but moderate to severe LVH. He has no fever, leukocytosis or respiratory symptoms.  Had thoracocentesis with removal of 1 L transudative fluid.  Culture negative. -Continue torsemide. -Recheck renal function and magnesium in 1 week.  History of CAD/CABG in 2015: No anginal symptoms. -Continue home medications  Chronic COPD: Stable.  No cardiopulmonary symptoms. -Continue home medications and PRN nebulizer  CKD-4 with mild azotemia and non-anion gap metabolic acidosis: Stable. -Recheck renal function in 1 week.  Hypokalemia: Likely due to diuretics. -Replenished prior to discharge.  Essential hypertension: Normotensive -Discharged on home medications.  DM-2 with renal complication.  CBG was in fair range. -Discharged on home medications.  Hypothyroidism -Continue home Synthroid  Thrombocytopenia: Platelet  79 -relatively stable.  Discharge Instructions   Allergies as of 11/06/2018      Reactions   Penicillins Other (See Comments)   Has patient had a PCN reaction causing  immediate rash, facial/tongue/throat swelling, SOB or lightheadedness with hypotension: Unknown Has patient had a PCN reaction causing severe rash involving mucus membranes or skin necrosis: Unknown Has patient had a PCN  reaction that required hospitalization: Unknown Has patient had a PCN reaction occurring within the last 10 years: Unknown If all of the above answers are "NO", then may proceed with Cephalosporin use.      Medication List    TAKE these medications   acetaminophen 325 MG tablet Commonly known as: TYLENOL Take 650 mg by mouth every 6 (six) hours as needed for fever.   amLODipine 5 MG tablet Commonly known as: NORVASC Take 1 tablet (5 mg total) by mouth daily.   ascorbic acid 500 MG tablet Commonly known as: VITAMIN C Take 500 mg by mouth daily.   atorvastatin 40 MG tablet Commonly known as: LIPITOR TAKE ONE (1) TABLET BY MOUTH EVERY DAY AT 6:00PM What changed: See the new instructions.   carvedilol 12.5 MG tablet Commonly known as: COREG Take 1 tablet (12.5 mg total) by mouth 2 (two) times daily with a meal.   cholecalciferol 25 MCG (1000 UT) tablet Commonly known as: VITAMIN D Take 2,000 Units by mouth 3 (three) times daily.   cloNIDine 0.2 mg/24hr patch Commonly known as: CATAPRES - Dosed in mg/24 hr Place 1 patch (0.2 mg total) onto the skin once a week.   Darbepoetin Alfa 40 MCG/0.4ML Sosy injection Commonly known as: ARANESP Inject 0.4 mLs (40 mcg total) into the skin every 30 (thirty) days.   doxazosin 8 MG tablet Commonly known as: CARDURA Take 1 tablet (8 mg total) by mouth every evening. What changed: when to take this   feeding supplement (ENSURE ENLIVE) Liqd Take 237 mLs by mouth 3 (three) times daily between meals.   ferrous sulfate 325 (65 FE) MG tablet Commonly known as: FeroSul Take 1 tablet (325 mg total) by mouth daily at 12 noon. What changed: when to take this   glucose blood test strip Commonly known as: OneTouch Verio Use  as instructed to test three times daily. ICD-10 code: E11.22   hydrALAZINE 100 MG tablet Commonly known as: APRESOLINE TAKE ONE TABLET BY MOUTH EVERY EIGHT HOURS What changed: See the new instructions.   insulin aspart 100 UNIT/ML injection Commonly known as: novoLOG Inject 0-10 Units into the skin 3 (three) times daily before meals. <150= 0 units, 151-200= 2 units, 201-250= 4 units, 251-300= 6 units, 301-350= 8 units, 351-400= 10 units   isosorbide mononitrate 120 MG 24 hr tablet Commonly known as: IMDUR Take 1 tablet (120 mg total) by mouth daily.   levothyroxine 100 MCG tablet Commonly known as: SYNTHROID Take 100 mcg by mouth daily before breakfast.   Lonhala Magnair Starter Kit 25 MCG/ML Soln Generic drug: Glycopyrrolate Inhale 1 mL into the lungs 2 (two) times a day.   nitroGLYCERIN 0.4 MG SL tablet Commonly known as: NITROSTAT Place 1 tablet (0.4 mg total) under the tongue every 5 (five) minutes as needed for chest pain. What changed: additional instructions   ondansetron 4 MG tablet Commonly known as: ZOFRAN Take 4 mg by mouth every 8 (eight) hours as needed for nausea or vomiting.   OneTouch Delica Lancets 13Y Misc 1 each by Does not apply route 3 (three) times daily. ICD-10 code: E11.22   OneTouch Verio Flex System w/Device Kit 1 kit by Does not apply route daily.   pantoprazole 40 MG tablet Commonly known as: PROTONIX Take 1 tablet (40 mg total) by mouth 2 (two) times daily.   polyethylene glycol 17 g packet Commonly known as: MIRALAX / GLYCOLAX Take 17 g by mouth daily as needed for mild constipation or moderate constipation.  sucralfate 1 g tablet Commonly known as: CARAFATE Take 1 tablet (1 g total) by mouth 4 (four) times daily -  with meals and at bedtime.   torsemide 20 MG tablet Commonly known as: DEMADEX Take 2 tablets (40 mg total) by mouth daily.   umeclidinium bromide 62.5 MCG/INH Aepb Commonly known as: INCRUSE ELLIPTA Inhale 1 puff into  the lungs daily.      Follow-up Information    Stark Klein, MD Follow up on 11/15/2018.   Specialty: General Surgery Why: your appointment is at 1:45PM.  Be at the office 35 minutes early for check in.  Bring photo ID and insurance information.   Contact information: [address] [place] [address] [phone]           Consultations:  General surgery, gastroenterology  Procedures/Studies:  2D Echo: None  EGD on 11/02/2018  Thoracocentesis on 11/04/2018  Ct Abdomen Pelvis Wo Contrast  Result Date: 11/04/2018 CLINICAL DATA:  Gastric cancer, evaluate for metastatic disease, history of GI bleed. EXAM: CT CHEST, ABDOMEN AND PELVIS WITHOUT CONTRAST TECHNIQUE: Multidetector CT imaging of the chest, abdomen and pelvis was performed following the standard protocol without IV contrast. COMPARISON:  March 31, 2018 03/28/2018 FINDINGS: CT CHEST FINDINGS Cardiovascular: Extensive calcifications throughout the thoracic aorta with signs of calcified coronary artery disease similar to previous study. Heart size remains enlarged without pericardial effusion and without significant change from previous exam. Mild engorgement of central pulmonary vasculature. Mediastinum/Nodes: Scattered lymph nodes throughout the mediastinum, largest along the right paratracheal chain measuring 1.2 cm previously 0.8 cm. Subcarinal lymph node also showing enlargement at approximately 1.9 cm short axis, previously 1 cm. Lungs/Pleura: Patchy areas of ground-glass attenuation have developed since 03/31/2018 also with some signs of septal thickening, basilar consolidation and small to moderate bilateral pleural effusions right greater than left Musculoskeletal: No signs of chest wall mass. Signs of median sternotomy for coronary revascularization similar to previous exams. CT ABDOMEN PELVIS FINDINGS Hepatobiliary: Limited assessment on noncontrast evaluation. No suspicious focal hepatic lesion. No  signs of biliary ductal dilation or pericholecystic inflammation. Pancreas: Unremarkable. No pancreatic ductal dilatation or surrounding inflammatory changes. Spleen: Normal in size without focal abnormality. Adrenals/Urinary Tract: Normal appearance of bilateral adrenal glands. Mild perinephric stranding bilaterally without signs of hydronephrosis. Similar to prior study. Presumed cyst arises from the lower pole of left kidney. Stomach/Bowel: No signs of acute gastrointestinal process. Scattered colonic diverticulosis favoring right hemicolon. Normal appearance of the appendix. Mild gastric thickening as before, not well evaluated. 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 Bilateral common iliac arterial dilation and aneurysmal caliber is also unchanged, as measured by this observer on the current and previous study. No signs of adenopathy in the upper abdomen or retroperitoneum. No signs of pelvic lymphadenopathy. Reproductive: Prostate not well evaluated.  Grossly unremarkable. Other: Small volume ascites in the pelvis.  No signs of free air. Musculoskeletal: Sternotomy changes as above. No signs of acute musculoskeletal process. IMPRESSION: 1. Bilateral effusions right greater than left, associated with basilar consolidative changes and patchy airspace opacities as well as interstitial thickening. Could potentially represent a combination of heart failure in the background with an overlay of pneumonitis. Correlation with BNP may be helpful. 2. Enlarging lymph nodes in the chest may be indicative of metastatic disease though in the setting of potential heart failure and/or infection remaining nonspecific at this time. 3. Cardiac enlargement with signs of generalized atherosclerosis and  coronary artery disease. 4. 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 5. Ascites further supporting the potential for cardiac  dysfunction or volume overload. Electronically Signed   By: Zetta Bills M.D.   On: 11/04/2018 08:16   Dg Chest 1 View  Result Date: 11/04/2018 CLINICAL DATA:  Status post right-sided thoracentesis. EXAM: CHEST  1 VIEW COMPARISON:  CT, 11/03/2018.  Radiographs, 06/23/2018. FINDINGS: Right pleural effusion is decreased following thoracentesis. There is no pneumothorax. Left pleural effusion persists. There is hazy bilateral airspace lung opacities similar to the prior chest CT. No new lung abnormalities. Stable changes from prior CABG surgery. IMPRESSION: 1. Decreased right pleural effusion following thoracentesis. No pneumothorax or evidence of a procedure complication. Electronically Signed   By: Lajean Manes M.D.   On: 11/04/2018 16:45   Ct Chest Wo Contrast  Result Date: 11/04/2018 CLINICAL DATA:  Gastric cancer, evaluate for metastatic disease, history of GI bleed. EXAM: CT CHEST, ABDOMEN AND PELVIS WITHOUT CONTRAST TECHNIQUE: Multidetector CT imaging of the chest, abdomen and pelvis was performed following the standard protocol without IV contrast. COMPARISON:  March 31, 2018 03/28/2018 FINDINGS: CT CHEST FINDINGS Cardiovascular: Extensive calcifications throughout the thoracic aorta with signs of calcified coronary artery disease similar to previous study. Heart size remains enlarged without pericardial effusion and without significant change from previous exam. Mild engorgement of central pulmonary vasculature. Mediastinum/Nodes: Scattered lymph nodes throughout the mediastinum, largest along the right paratracheal chain measuring 1.2 cm previously 0.8 cm. Subcarinal lymph node also showing enlargement at approximately 1.9 cm short axis, previously 1 cm. Lungs/Pleura: Patchy areas of ground-glass attenuation have developed since 03/31/2018 also with some signs of septal thickening, basilar consolidation and small to moderate bilateral pleural effusions right greater than left Musculoskeletal: No  signs of chest wall mass. Signs of median sternotomy for coronary revascularization similar to previous exams. CT ABDOMEN PELVIS FINDINGS Hepatobiliary: Limited assessment on noncontrast evaluation. No suspicious focal hepatic lesion. No signs of biliary ductal dilation or pericholecystic inflammation. Pancreas: Unremarkable. No pancreatic ductal dilatation or surrounding inflammatory changes. Spleen: Normal in size without focal abnormality. Adrenals/Urinary Tract: Normal appearance of bilateral adrenal glands. Mild perinephric stranding bilaterally without signs of hydronephrosis. Similar to prior study. Presumed cyst arises from the lower pole of left kidney. Stomach/Bowel: No signs of acute gastrointestinal process. Scattered colonic diverticulosis favoring right hemicolon. Normal appearance of the appendix. Mild gastric thickening as before, not well evaluated. 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 Bilateral common iliac arterial dilation and aneurysmal caliber is also unchanged, as measured by this observer on the current and previous study. No signs of adenopathy in the upper abdomen or retroperitoneum. No signs of pelvic lymphadenopathy. Reproductive: Prostate not well evaluated.  Grossly unremarkable. Other: Small volume ascites in the pelvis.  No signs of free air. Musculoskeletal: Sternotomy changes as above. No signs of acute musculoskeletal process. IMPRESSION: 1. Bilateral effusions right greater than left, associated with basilar consolidative changes and patchy airspace opacities as well as interstitial thickening. Could potentially represent a combination of heart failure in the background with an overlay of pneumonitis. Correlation with BNP may be helpful. 2. Enlarging lymph nodes in the chest may be indicative of metastatic disease though in the setting of potential heart failure and/or infection remaining  nonspecific at this time. 3. Cardiac enlargement with signs of generalized atherosclerosis and coronary artery disease. 4. 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 5. Ascites further supporting the  potential for cardiac dysfunction or volume overload. Electronically Signed   By: Zetta Bills M.D.   On: 11/04/2018 08:16   US Thoracentesis Asp Pleural Space W/img Guide  Result Date: 11/04/2018 INDICATION: Patient with history of gastric cancer with recent GI bleed, coronary artery disease with CABG in 2015, COPD, chronic kidney disease, bilateral pleural effusions right greater than left. Request received for diagnostic and therapeutic right thoracentesis. EXAM: ULTRASOUND GUIDED DIAGNOSTIC AND THERAPEUTIC RIGHT THORACENTESIS MEDICATIONS: None COMPLICATIONS: None immediate. PROCEDURE: An ultrasound guided thoracentesis was thoroughly discussed with the patient and questions answered. The benefits, risks, alternatives and complications were also discussed. The patient understands and wishes to proceed with the procedure. Written consent was obtained. Ultrasound was performed to localize and mark an adequate pocket of fluid in the right chest. The area was then prepped and draped in the normal sterile fashion. 1% Lidocaine was used for local anesthesia. Under ultrasound guidance a 6 Fr Safe-T-Centesis catheter was introduced. Thoracentesis was performed. The catheter was removed and a dressing applied. FINDINGS: A total of approximately 1 liter of hazy, amber fluid was removed. Samples were sent to the laboratory as requested by the clinical team. IMPRESSION: Successful ultrasound guided diagnostic and therapeutic right thoracentesis yielding 1 liter of pleural fluid. Read by: Rowe Robert, PA-C Electronically Signed   By: Constance Holster M.D.   On: 11/04/2018 16:35      Subjective: No major events overnight of this morning.  No complaint this morning.  Denies chest pain, dyspnea, nausea,  vomiting or abdominal pain.   Discharge Exam: Vitals:   11/06/18 0417 11/06/18 0940  BP: (!) 159/58 (!) 163/58  Pulse: 65 65  Resp: 18   Temp: 98.9 F (37.2 C)   SpO2: 91% 96%    GENERAL: No acute distress.  Appears well.  HEENT: MMM.  Vision and hearing grossly intact.  NECK: Supple.  No JVD.  LUNGS:  No IWOB. Good air movement bilaterally. HEART:  RRR. Heart sounds normal.  ABD: Bowel sounds present. Soft. Non tender.  MSK/EXT: Left AKA. SKIN: no apparent skin lesion or wound NEURO: Awake, alert and oriented appropriately.  No gross deficit.  PSYCH: Calm. Normal affect.   The results of significant diagnostics from this hospitalization (including imaging, microbiology, ancillary and laboratory) are listed below for reference.     Microbiology: Recent Results (from the past 240 hour(s))  SARS CORONAVIRUS 2 (TAT 6-24 HRS) Nasopharyngeal Nasopharyngeal Swab     Status: None   Collection Time: 11/01/18  7:19 PM   Specimen: Nasopharyngeal Swab  Result Value Ref Range Status   SARS Coronavirus 2 NEGATIVE NEGATIVE Final    Comment: (NOTE) SARS-CoV-2 target nucleic acids are NOT DETECTED. The SARS-CoV-2 RNA is generally detectable in upper and lower respiratory specimens during the acute phase of infection. Negative results do not preclude SARS-CoV-2 infection, do not rule out co-infections with other pathogens, and should not be used as the sole basis for treatment or other patient management decisions. Negative results must be combined with clinical observations, patient history, and epidemiological information. The expected result is Negative. Fact Sheet for Patients: SugarRoll.be Fact Sheet for Healthcare Providers: https://www.woods-mathews.com/ This test is not yet approved or cleared by the [place] FDA and  has been authorized for detection and/or diagnosis of SARS-CoV-2 by FDA under an Emergency Use Authorization  (EUA). This EUA will remain  in effect (meaning this test can be used) for the duration of the COVID-19 declaration under Section 56 4(b)(1) of the  Act, 21 U.S.C. section 360bbb-3(b)(1), unless the authorization is terminated or revoked sooner. Performed at [facility], [place] [address] [place], [place] [address]   Body fluid culture     Status: None (Preliminary result)   Collection Time: 11/04/18  4:14 PM   Specimen: PATH Cytology Pleural fluid  Result Value Ref Range Status   Specimen Description   Final    PLEURAL RIGHT Performed at [facility], [place] [address] [place], [place] [address]    Special Requests   Final    NONE Performed at [facility], [place] [address] [place], [place] [address]    Gram Stain   Final    RARE WBC PRESENT, PREDOMINANTLY MONONUCLEAR NO ORGANISMS SEEN    Culture   Final    CULTURE REINCUBATED FOR BETTER GROWTH Performed at [facility], [place] [address], [place], [place] [address]    Report Status PENDING  Incomplete     Labs: BNP (last 3 results) Recent Labs    12/03/17 1011 03/28/18 1758 06/23/18 2039  BNP 436.1* 171.7* 295.6*   Basic Metabolic Panel: Recent Labs  Lab 11/01/18 1613 11/02/18 0531 11/03/18 0242 11/04/18 0458 11/05/18 0500 11/06/18 0454  NA 143 144 142 142  --  142  K 3.2* 3.0* 3.0* 3.5  --  3.3*  CL 112* 113* 114* 114*  --  113*  CO2 19* 21* 21* 20*  --  20*  GLUCOSE 242* 135* 115* 114*  --  114*  BUN 49* 48* 44* 48*  --  49*  CREATININE 2.85* 2.69* 2.92* 2.77*  --  2.81*  CALCIUM 8.1* 8.0* 7.8* 7.9*  --  7.6*  MG  --   --   --  2.0 1.9 1.9  PHOS  --   --   --   --   --  3.7   Liver Function Tests: Recent Labs  Lab 11/02/18 0531 11/04/18 1712  AST 14* 13*  ALT 11 11  ALKPHOS 101 105  BILITOT 1.2 0.7  PROT 5.4* 5.4*  ALBUMIN 3.0* 2.8*   No results for input(s): LIPASE, AMYLASE in the last 168 hours. No results for input(s): AMMONIA in the last  168 hours. CBC: Recent Labs  Lab 11/01/18 1613 11/02/18 0531  11/03/18 0242 11/04/18 0458 11/04/18 1712 11/05/18 0500 11/05/18 2301 11/06/18 0454  WBC 4.1 5.3  --  5.9 5.0  --  6.2  --  5.3  NEUTROABS 2.6  --   --  4.1  --   --   --   --   --   HGB 4.5* 6.5*   < > 7.3* 7.1* 8.3* 7.8* 8.8* 8.6*  HCT 14.8* 20.2*   < > 23.4* 22.6* 26.1* 24.6* 28.0* 28.0*  MCV 98.7 95.7  --  95.1 96.2  --  96.1  --  96.9  PLT 87* 88*  --  86* 79*  --  82*  --  79*   < > = values in this interval not displayed.   Cardiac Enzymes: No results for input(s): CKTOTAL, CKMB, CKMBINDEX, TROPONINI in the last 168 hours. BNP: Invalid input(s): POCBNP CBG: Recent Labs  Lab 11/05/18 2030 11/06/18 0036 11/06/18 0411 11/06/18 0800 11/06/18 1202  GLUCAP 130* 138* 117* 104* 150*   D-Dimer No results for input(s): DDIMER in the last 72 hours. Hgb A1c No results for input(s): HGBA1C in the last 72 hours. Lipid Profile No results for input(s): CHOL, HDL, LDLCALC, TRIG,  CHOLHDL, LDLDIRECT in the last 72 hours. Thyroid function studies No results for input(s): TSH, T4TOTAL, T3FREE, THYROIDAB in the last 72 hours.  Invalid input(s): FREET3 Anemia work up No results for input(s): VITAMINB12, FOLATE, FERRITIN, TIBC, IRON, RETICCTPCT in the last 72 hours. Urinalysis    Component Value Date/Time   COLORURINE YELLOW 06/24/2018 0628   APPEARANCEUR CLOUDY (A) 06/24/2018 0628   LABSPEC 1.012 06/24/2018 0628   PHURINE 7.0 06/24/2018 0628   GLUCOSEU NEGATIVE 06/24/2018 0628   HGBUR NEGATIVE 06/24/2018 0628   BILIRUBINUR NEGATIVE 06/24/2018 0628   BILIRUBINUR NEG 06/26/2015 1540   KETONESUR NEGATIVE 06/24/2018 0628   PROTEINUR 100 (A) 06/24/2018 0628   UROBILINOGEN 1.0 06/26/2015 1540   UROBILINOGEN 0.2 10/22/2013 2156   NITRITE NEGATIVE 06/24/2018 0628   LEUKOCYTESUR LARGE (A) 06/24/2018 0628   Sepsis Labs Invalid input(s): PROCALCITONIN,  WBC,  LACTICIDVEN   Time coordinating discharge: 35  minutes  SIGNED:  Mercy Riding, MD  [facility] 11/06/2018, 12:49 PM  If 7PM-7AM, please contact night-coverage [email or web address] Password [email or web address]

## 2018-11-07 DIAGNOSIS — Z03818 Encounter for observation for suspected exposure to other biological agents ruled out: Secondary | ICD-10-CM | POA: Diagnosis not present

## 2018-11-07 DIAGNOSIS — D509 Iron deficiency anemia, unspecified: Secondary | ICD-10-CM | POA: Diagnosis present

## 2018-11-07 DIAGNOSIS — J449 Chronic obstructive pulmonary disease, unspecified: Secondary | ICD-10-CM | POA: Diagnosis present

## 2018-11-07 DIAGNOSIS — M2550 Pain in unspecified joint: Secondary | ICD-10-CM | POA: Diagnosis not present

## 2018-11-07 DIAGNOSIS — I2510 Atherosclerotic heart disease of native coronary artery without angina pectoris: Secondary | ICD-10-CM | POA: Diagnosis not present

## 2018-11-07 DIAGNOSIS — D500 Iron deficiency anemia secondary to blood loss (chronic): Secondary | ICD-10-CM | POA: Diagnosis not present

## 2018-11-07 DIAGNOSIS — E876 Hypokalemia: Secondary | ICD-10-CM | POA: Diagnosis present

## 2018-11-07 DIAGNOSIS — D631 Anemia in chronic kidney disease: Secondary | ICD-10-CM | POA: Diagnosis present

## 2018-11-07 DIAGNOSIS — I5032 Chronic diastolic (congestive) heart failure: Secondary | ICD-10-CM | POA: Diagnosis present

## 2018-11-07 DIAGNOSIS — C169 Malignant neoplasm of stomach, unspecified: Secondary | ICD-10-CM | POA: Diagnosis not present

## 2018-11-07 DIAGNOSIS — D630 Anemia in neoplastic disease: Secondary | ICD-10-CM | POA: Diagnosis present

## 2018-11-07 DIAGNOSIS — E118 Type 2 diabetes mellitus with unspecified complications: Secondary | ICD-10-CM | POA: Diagnosis not present

## 2018-11-07 DIAGNOSIS — I130 Hypertensive heart and chronic kidney disease with heart failure and stage 1 through stage 4 chronic kidney disease, or unspecified chronic kidney disease: Secondary | ICD-10-CM | POA: Diagnosis present

## 2018-11-07 DIAGNOSIS — E1165 Type 2 diabetes mellitus with hyperglycemia: Secondary | ICD-10-CM | POA: Diagnosis not present

## 2018-11-07 DIAGNOSIS — J9611 Chronic respiratory failure with hypoxia: Secondary | ICD-10-CM | POA: Diagnosis present

## 2018-11-07 DIAGNOSIS — D62 Acute posthemorrhagic anemia: Secondary | ICD-10-CM | POA: Diagnosis present

## 2018-11-07 DIAGNOSIS — Z7401 Bed confinement status: Secondary | ICD-10-CM | POA: Diagnosis not present

## 2018-11-07 DIAGNOSIS — N189 Chronic kidney disease, unspecified: Secondary | ICD-10-CM | POA: Diagnosis not present

## 2018-11-07 DIAGNOSIS — C170 Malignant neoplasm of duodenum: Secondary | ICD-10-CM | POA: Diagnosis present

## 2018-11-07 DIAGNOSIS — Z89612 Acquired absence of left leg above knee: Secondary | ICD-10-CM | POA: Diagnosis not present

## 2018-11-07 DIAGNOSIS — C163 Malignant neoplasm of pyloric antrum: Secondary | ICD-10-CM | POA: Diagnosis present

## 2018-11-07 DIAGNOSIS — R0902 Hypoxemia: Secondary | ICD-10-CM | POA: Diagnosis not present

## 2018-11-07 DIAGNOSIS — D649 Anemia, unspecified: Secondary | ICD-10-CM | POA: Diagnosis present

## 2018-11-07 DIAGNOSIS — I739 Peripheral vascular disease, unspecified: Secondary | ICD-10-CM | POA: Diagnosis present

## 2018-11-07 DIAGNOSIS — E1122 Type 2 diabetes mellitus with diabetic chronic kidney disease: Secondary | ICD-10-CM | POA: Diagnosis present

## 2018-11-07 DIAGNOSIS — K922 Gastrointestinal hemorrhage, unspecified: Secondary | ICD-10-CM | POA: Diagnosis not present

## 2018-11-07 DIAGNOSIS — Z515 Encounter for palliative care: Secondary | ICD-10-CM | POA: Diagnosis present

## 2018-11-07 DIAGNOSIS — R188 Other ascites: Secondary | ICD-10-CM | POA: Diagnosis not present

## 2018-11-07 DIAGNOSIS — E1151 Type 2 diabetes mellitus with diabetic peripheral angiopathy without gangrene: Secondary | ICD-10-CM | POA: Diagnosis present

## 2018-11-07 DIAGNOSIS — I82512 Chronic embolism and thrombosis of left femoral vein: Secondary | ICD-10-CM | POA: Diagnosis present

## 2018-11-07 DIAGNOSIS — I25119 Atherosclerotic heart disease of native coronary artery with unspecified angina pectoris: Secondary | ICD-10-CM | POA: Diagnosis not present

## 2018-11-07 DIAGNOSIS — Z20828 Contact with and (suspected) exposure to other viral communicable diseases: Secondary | ICD-10-CM | POA: Diagnosis present

## 2018-11-07 DIAGNOSIS — E119 Type 2 diabetes mellitus without complications: Secondary | ICD-10-CM | POA: Diagnosis not present

## 2018-11-07 DIAGNOSIS — I509 Heart failure, unspecified: Secondary | ICD-10-CM | POA: Diagnosis not present

## 2018-11-07 DIAGNOSIS — I2721 Secondary pulmonary arterial hypertension: Secondary | ICD-10-CM | POA: Diagnosis present

## 2018-11-07 DIAGNOSIS — D61818 Other pancytopenia: Secondary | ICD-10-CM | POA: Diagnosis not present

## 2018-11-07 DIAGNOSIS — N184 Chronic kidney disease, stage 4 (severe): Secondary | ICD-10-CM | POA: Diagnosis present

## 2018-11-07 DIAGNOSIS — D002 Carcinoma in situ of stomach: Secondary | ICD-10-CM | POA: Diagnosis not present

## 2018-11-07 DIAGNOSIS — M6281 Muscle weakness (generalized): Secondary | ICD-10-CM | POA: Diagnosis present

## 2018-11-07 DIAGNOSIS — I272 Pulmonary hypertension, unspecified: Secondary | ICD-10-CM | POA: Diagnosis not present

## 2018-11-07 DIAGNOSIS — R531 Weakness: Secondary | ICD-10-CM | POA: Diagnosis not present

## 2018-11-07 DIAGNOSIS — E11319 Type 2 diabetes mellitus with unspecified diabetic retinopathy without macular edema: Secondary | ICD-10-CM | POA: Diagnosis present

## 2018-11-07 DIAGNOSIS — C801 Malignant (primary) neoplasm, unspecified: Secondary | ICD-10-CM | POA: Diagnosis not present

## 2018-11-07 DIAGNOSIS — Z7189 Other specified counseling: Secondary | ICD-10-CM | POA: Diagnosis not present

## 2018-11-07 DIAGNOSIS — E782 Mixed hyperlipidemia: Secondary | ICD-10-CM | POA: Diagnosis present

## 2018-11-07 DIAGNOSIS — E1142 Type 2 diabetes mellitus with diabetic polyneuropathy: Secondary | ICD-10-CM | POA: Diagnosis not present

## 2018-11-07 DIAGNOSIS — Z66 Do not resuscitate: Secondary | ICD-10-CM | POA: Diagnosis present

## 2018-11-07 DIAGNOSIS — I10 Essential (primary) hypertension: Secondary | ICD-10-CM | POA: Diagnosis not present

## 2018-11-07 LAB — CBC
HCT: 26.9 % — ABNORMAL LOW (ref 39.0–52.0)
Hemoglobin: 8.3 g/dL — ABNORMAL LOW (ref 13.0–17.0)
MCH: 29.4 pg (ref 26.0–34.0)
MCHC: 30.9 g/dL (ref 30.0–36.0)
MCV: 95.4 fL (ref 80.0–100.0)
Platelets: 83 10*3/uL — ABNORMAL LOW (ref 150–400)
RBC: 2.82 MIL/uL — ABNORMAL LOW (ref 4.22–5.81)
RDW: 16 % — ABNORMAL HIGH (ref 11.5–15.5)
WBC: 4.4 10*3/uL (ref 4.0–10.5)
nRBC: 0 % (ref 0.0–0.2)

## 2018-11-07 LAB — GLUCOSE, CAPILLARY
Glucose-Capillary: 106 mg/dL — ABNORMAL HIGH (ref 70–99)
Glucose-Capillary: 117 mg/dL — ABNORMAL HIGH (ref 70–99)
Glucose-Capillary: 122 mg/dL — ABNORMAL HIGH (ref 70–99)
Glucose-Capillary: 105 mg/dL — ABNORMAL HIGH (ref 70–99)

## 2018-11-07 LAB — BASIC METABOLIC PANEL
Anion gap: 8 (ref 5–15)
BUN: 52 mg/dL — ABNORMAL HIGH (ref 8–23)
CO2: 21 mmol/L — ABNORMAL LOW (ref 22–32)
Calcium: 7.7 mg/dL — ABNORMAL LOW (ref 8.9–10.3)
Chloride: 114 mmol/L — ABNORMAL HIGH (ref 98–111)
Creatinine, Ser: 2.83 mg/dL — ABNORMAL HIGH (ref 0.61–1.24)
GFR calc Af Amer: 26 mL/min — ABNORMAL LOW (ref >60)
GFR calc non Af Amer: 22 mL/min — ABNORMAL LOW (ref >60)
Glucose, Bld: 116 mg/dL — ABNORMAL HIGH (ref 70–99)
Potassium: 3.6 mmol/L (ref 3.5–5.1)
Sodium: 143 mmol/L (ref 135–145)

## 2018-11-07 LAB — MAGNESIUM: Magnesium: 2 mg/dL (ref 1.7–2.4)

## 2018-11-07 NOTE — Discharge Summary (Signed)
Physician Discharge Summary  Leonard [name] GBT:[id number] DOB: [birth year] DOA: 11/01/2018  PCP: Kathrene Alu, MD  Admit date: 11/01/2018 Discharge date: 11/07/2018  Admitted From: SNF Disposition: SNF  Recommendations for Outpatient Follow-up:  1. Follow up with PCP and general surgery in 1-2 weeks 2. Please obtain CBC/BMP/Mag at follow up 3. Please follow up on the following pending results: None  Home Health: Not applicable Equipment/Devices: Not applicable  Discharge Condition: Stable CODE STATUS: Full code  Hospital Course: 68 year old male with history of HTN, HLD, DM-2, CAD, PVD/left AKA, CKD-4, COPD, anemia of chronic disease and recent diagnosis of gastric adenocarcinoma s/p radiation as currently deemed non-chemo or surgical candidate presenting with symptomatic anemia with Hgb down to 4.7.  Received 4 units throughout his hospitalization. 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  General surgery consulted as well.  CT chest/abdomen/pelvis on 11/03/2018 revealed bilateral mild-to-moderate pleural effusion, right greater than left, with basilar consolidative changes, patchy airway opacities and interstitial thickening,  enlarged lymph nodes in his chest and cardiac enlargement.  He had ultrasound-guided thoracocentesis of right pleural effusion with removal of 1 L transudative fluid.  Cultures negative so far.  Patient is already on furosemide.  Has no cardiopulmonary symptoms.  On the day of discharge, H&H relatively stable.  Patient will follow up with general surgery, [name] for possible palliative radiation therapy of his gastric adenocarcinoma.   See individual problem list below for more on hospital course.  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  -Continue by mouth Carafate and Protonix. -Recheck CBC in 1 week. -Outpatient follow-up with general surgery for possible palliative radiation therapy of his gastric adenocarcinoma.  Gastric adenocarcinoma s/p radiation as currently deemed non-chemo or surgical candidate  -EGD as above. -CT chest/abdomen/pelvis reveals bilateral pleural effusion and intrathoracic LAD concerning for metastasis. -General surgery to arrange outpatient follow-up with [name] to discuss palliative radiation  Mild to moderate bilateral pleural effusion, right greater than left.  Patient does not have cardiopulmonary symptoms.  Differential diagnosis includes CHF, metastatic cancer or pulmonary infection.  Last echo in 2019 with normal EF but moderate to severe LVH. He has no fever, leukocytosis or respiratory symptoms.  Had thoracocentesis with removal of 1 L transudative fluid.  Culture negative. -Continue torsemide. -Recheck renal function and magnesium in 1 week.  History of CAD/CABG in 2015: No anginal symptoms. -Continue home medications  Chronic COPD: Stable.  No cardiopulmonary symptoms. -Continue home medications and PRN nebulizer  CKD-4 with mild azotemia and non-anion gap metabolic acidosis: Stable. -Recheck renal function in 1 week.  Hypokalemia: Likely due to diuretics. -Replenished prior to discharge.  Essential hypertension: Normotensive -Discharged on home medications.  DM-2 with renal complication.  CBG was in fair range. -Discharged on home medications.  Hypothyroidism -Continue home Synthroid  Thrombocytopenia: Platelet  79 -relatively stable.  Discharge Instructions   Allergies as of 11/07/2018      Reactions   Penicillins Other (See Comments)   Has patient had a PCN reaction causing immediate rash,  facial/tongue/throat swelling, SOB or lightheadedness with hypotension: Unknown Has patient had a PCN reaction causing severe rash involving mucus membranes or skin necrosis: Unknown Has patient had a PCN reaction that  required hospitalization: Unknown Has patient had a PCN reaction occurring within the last 10 years: Unknown If all of the above answers are "NO", then may proceed with Cephalosporin use.      Medication List    TAKE these medications   acetaminophen 325 MG tablet Commonly known as: TYLENOL Take 650 mg by mouth every 6 (six) hours as needed for fever.   amLODipine 5 MG tablet Commonly known as: NORVASC Take 1 tablet (5 mg total) by mouth daily.   ascorbic acid 500 MG tablet Commonly known as: VITAMIN C Take 500 mg by mouth daily.   atorvastatin 40 MG tablet Commonly known as: LIPITOR TAKE ONE (1) TABLET BY MOUTH EVERY DAY AT 6:00PM What changed: See the new instructions.   carvedilol 12.5 MG tablet Commonly known as: COREG Take 1 tablet (12.5 mg total) by mouth 2 (two) times daily with a meal.   cholecalciferol 25 MCG (1000 UT) tablet Commonly known as: VITAMIN D Take 2,000 Units by mouth 3 (three) times daily.   cloNIDine 0.2 mg/24hr patch Commonly known as: CATAPRES - Dosed in mg/24 hr Place 1 patch (0.2 mg total) onto the skin once a week.   Darbepoetin Alfa 40 MCG/0.4ML Sosy injection Commonly known as: ARANESP Inject 0.4 mLs (40 mcg total) into the skin every 30 (thirty) days.   doxazosin 8 MG tablet Commonly known as: CARDURA Take 1 tablet (8 mg total) by mouth every evening. What changed: when to take this   feeding supplement (ENSURE ENLIVE) Liqd Take 237 mLs by mouth 3 (three) times daily between meals.   ferrous sulfate 325 (65 FE) MG tablet Commonly known as: FeroSul Take 1 tablet (325 mg total) by mouth daily at 12 noon. What changed: when to take this   folic acid 1 MG tablet Commonly known as: FOLVITE Take 1 tablet (1 mg total) by  mouth daily.   glucose blood test strip Commonly known as: OneTouch Verio Use as instructed to test three times daily. ICD-10 code: E11.22   hydrALAZINE 100 MG tablet Commonly known as: APRESOLINE TAKE ONE TABLET BY MOUTH EVERY EIGHT HOURS What changed: See the new instructions.   insulin aspart 100 UNIT/ML injection Commonly known as: novoLOG Inject 0-10 Units into the skin 3 (three) times daily before meals. <150= 0 units, 151-200= 2 units, 201-250= 4 units, 251-300= 6 units, 301-350= 8 units, 351-400= 10 units   isosorbide mononitrate 120 MG 24 hr tablet Commonly known as: IMDUR Take 1 tablet (120 mg total) by mouth daily.   levothyroxine 100 MCG tablet Commonly known as: SYNTHROID Take 100 mcg by mouth daily before breakfast.   Lonhala Magnair Starter Kit 25 MCG/ML Soln Generic drug: Glycopyrrolate Inhale 1 mL into the lungs 2 (two) times a day.   nitroGLYCERIN 0.4 MG SL tablet Commonly known as: NITROSTAT Place 1 tablet (0.4 mg total) under the tongue every 5 (five) minutes as needed for chest pain. What changed: additional instructions   ondansetron 4 MG tablet Commonly known as: ZOFRAN Take 4 mg by mouth every 8 (eight) hours as needed for nausea or vomiting.   OneTouch Delica Lancets 35D Misc 1 each by Does not apply route 3 (three) times daily. ICD-10 code: E11.22   OneTouch Verio Flex System w/Device Kit 1 kit by Does not apply route daily.   pantoprazole 40 MG tablet Commonly known as: PROTONIX Take 1 tablet (40 mg total) by mouth 2 (two) times daily.   polyethylene glycol 17 g packet Commonly known  as: MIRALAX / GLYCOLAX Take 17 g by mouth daily as needed for mild constipation or moderate constipation.   sucralfate 1 g tablet Commonly known as: CARAFATE Take 1 tablet (1 g total) by mouth 4 (four) times daily -  with meals and at bedtime.   torsemide 20 MG tablet Commonly known as: DEMADEX Take 2 tablets (40 mg total) by mouth daily.   umeclidinium  bromide 62.5 MCG/INH Aepb Commonly known as: INCRUSE ELLIPTA Inhale 1 puff into the lungs daily.      Follow-up Information    Stark Klein, MD Follow up on 11/15/2018.   Specialty: General Surgery Why: your appointment is at 1:45PM.  Be at the office 35 minutes early for check in.  Bring photo ID and insurance information.   Contact information: [address] [place] [address] [phone]           Consultations:  General surgery, gastroenterology  Procedures/Studies:  2D Echo: None  EGD on 11/02/2018  Thoracocentesis on 11/04/2018  Ct Abdomen Pelvis Wo Contrast  Result Date: 11/04/2018 CLINICAL DATA:  Gastric cancer, evaluate for metastatic disease, history of GI bleed. EXAM: CT CHEST, ABDOMEN AND PELVIS WITHOUT CONTRAST TECHNIQUE: Multidetector CT imaging of the chest, abdomen and pelvis was performed following the standard protocol without IV contrast. COMPARISON:  March 31, 2018 03/28/2018 FINDINGS: CT CHEST FINDINGS Cardiovascular: Extensive calcifications throughout the thoracic aorta with signs of calcified coronary artery disease similar to previous study. Heart size remains enlarged without pericardial effusion and without significant change from previous exam. Mild engorgement of central pulmonary vasculature. Mediastinum/Nodes: Scattered lymph nodes throughout the mediastinum, largest along the right paratracheal chain measuring 1.2 cm previously 0.8 cm. Subcarinal lymph node also showing enlargement at approximately 1.9 cm short axis, previously 1 cm. Lungs/Pleura: Patchy areas of ground-glass attenuation have developed since 03/31/2018 also with some signs of septal thickening, basilar consolidation and small to moderate bilateral pleural effusions right greater than left Musculoskeletal: No signs of chest wall mass. Signs of median sternotomy for coronary revascularization similar to previous exams. CT ABDOMEN PELVIS FINDINGS Hepatobiliary: Limited  assessment on noncontrast evaluation. No suspicious focal hepatic lesion. No signs of biliary ductal dilation or pericholecystic inflammation. Pancreas: Unremarkable. No pancreatic ductal dilatation or surrounding inflammatory changes. Spleen: Normal in size without focal abnormality. Adrenals/Urinary Tract: Normal appearance of bilateral adrenal glands. Mild perinephric stranding bilaterally without signs of hydronephrosis. Similar to prior study. Presumed cyst arises from the lower pole of left kidney. Stomach/Bowel: No signs of acute gastrointestinal process. Scattered colonic diverticulosis favoring right hemicolon. Normal appearance of the appendix. Mild gastric thickening as before, not well evaluated. 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 Bilateral common iliac arterial dilation and aneurysmal caliber is also unchanged, as measured by this observer on the current and previous study. No signs of adenopathy in the upper abdomen or retroperitoneum. No signs of pelvic lymphadenopathy. Reproductive: Prostate not well evaluated.  Grossly unremarkable. Other: Small volume ascites in the pelvis.  No signs of free air. Musculoskeletal: Sternotomy changes as above. No signs of acute musculoskeletal process. IMPRESSION: 1. Bilateral effusions right greater than left, associated with basilar consolidative changes and patchy airspace opacities as well as interstitial thickening. Could potentially represent a combination of heart failure in the background with an overlay of pneumonitis. Correlation with BNP may be helpful. 2. Enlarging lymph nodes in the chest may be indicative of metastatic disease though in the setting  of potential heart failure and/or infection remaining nonspecific at this time. 3. Cardiac enlargement with signs of generalized atherosclerosis and coronary artery disease. 4. Aorto bi-iliac aneurysmal caliber similar  to prior study. 5. Ascites further supporting the potential for cardiac dysfunction or volume overload. Electronically Signed   By: Zetta Bills M.D.   On: 11/04/2018 08:16   Dg Chest 1 View  Result Date: 11/04/2018 CLINICAL DATA:  Status post right-sided thoracentesis. EXAM: CHEST  1 VIEW COMPARISON:  CT, 11/03/2018.  Radiographs, 06/23/2018. FINDINGS: Right pleural effusion is decreased following thoracentesis. There is no pneumothorax. Left pleural effusion persists. There is hazy bilateral airspace lung opacities similar to the prior chest CT. No new lung abnormalities. Stable changes from prior CABG surgery. IMPRESSION: 1. Decreased right pleural effusion following thoracentesis. No pneumothorax or evidence of a procedure complication. Electronically Signed   By: Lajean Manes M.D.   On: 11/04/2018 16:45   Ct Chest Wo Contrast  Result Date: 11/04/2018 CLINICAL DATA:  Gastric cancer, evaluate for metastatic disease, history of GI bleed. EXAM: CT CHEST, ABDOMEN AND PELVIS WITHOUT CONTRAST TECHNIQUE: Multidetector CT imaging of the chest, abdomen and pelvis was performed following the standard protocol without IV contrast. COMPARISON:  March 31, 2018 03/28/2018 FINDINGS: CT CHEST FINDINGS Cardiovascular: Extensive calcifications throughout the thoracic aorta with signs of calcified coronary artery disease similar to previous study. Heart size remains enlarged without pericardial effusion and without significant change from previous exam. Mild engorgement of central pulmonary vasculature. Mediastinum/Nodes: Scattered lymph nodes throughout the mediastinum, largest along the right paratracheal chain measuring 1.2 cm previously 0.8 cm. Subcarinal lymph node also showing enlargement at approximately 1.9 cm short axis, previously 1 cm. Lungs/Pleura: Patchy areas of ground-glass attenuation have developed since 03/31/2018 also with some signs of septal thickening, basilar consolidation and small to moderate  bilateral pleural effusions right greater than left Musculoskeletal: No signs of chest wall mass. Signs of median sternotomy for coronary revascularization similar to previous exams. CT ABDOMEN PELVIS FINDINGS Hepatobiliary: Limited assessment on noncontrast evaluation. No suspicious focal hepatic lesion. No signs of biliary ductal dilation or pericholecystic inflammation. Pancreas: Unremarkable. No pancreatic ductal dilatation or surrounding inflammatory changes. Spleen: Normal in size without focal abnormality. Adrenals/Urinary Tract: Normal appearance of bilateral adrenal glands. Mild perinephric stranding bilaterally without signs of hydronephrosis. Similar to prior study. Presumed cyst arises from the lower pole of left kidney. Stomach/Bowel: No signs of acute gastrointestinal process. Scattered colonic diverticulosis favoring right hemicolon. Normal appearance of the appendix. Mild gastric thickening as before, not well evaluated. 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 Bilateral common iliac arterial dilation and aneurysmal caliber is also unchanged, as measured by this observer on the current and previous study. No signs of adenopathy in the upper abdomen or retroperitoneum. No signs of pelvic lymphadenopathy. Reproductive: Prostate not well evaluated.  Grossly unremarkable. Other: Small volume ascites in the pelvis.  No signs of free air. Musculoskeletal: Sternotomy changes as above. No signs of acute musculoskeletal process. IMPRESSION: 1. Bilateral effusions right greater than left, associated with basilar consolidative changes and patchy airspace opacities as well as interstitial thickening. Could potentially represent a combination of heart failure in the background with an overlay of pneumonitis. Correlation with BNP may be helpful. 2. Enlarging lymph nodes in the chest may be indicative of metastatic disease though  in the setting of potential heart failure and/or infection remaining nonspecific at this time. 3. Cardiac enlargement with signs of  generalized atherosclerosis and coronary artery disease. 4. 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 5. Ascites further supporting the potential for cardiac dysfunction or volume overload. Electronically Signed   By: Zetta Bills M.D.   On: 11/04/2018 08:16   US Thoracentesis Asp Pleural Space W/img Guide  Result Date: 11/04/2018 INDICATION: Patient with history of gastric cancer with recent GI bleed, coronary artery disease with CABG in 2015, COPD, chronic kidney disease, bilateral pleural effusions right greater than left. Request received for diagnostic and therapeutic right thoracentesis. EXAM: ULTRASOUND GUIDED DIAGNOSTIC AND THERAPEUTIC RIGHT THORACENTESIS MEDICATIONS: None COMPLICATIONS: None immediate. PROCEDURE: An ultrasound guided thoracentesis was thoroughly discussed with the patient and questions answered. The benefits, risks, alternatives and complications were also discussed. The patient understands and wishes to proceed with the procedure. Written consent was obtained. Ultrasound was performed to localize and mark an adequate pocket of fluid in the right chest. The area was then prepped and draped in the normal sterile fashion. 1% Lidocaine was used for local anesthesia. Under ultrasound guidance a 6 Fr Safe-T-Centesis catheter was introduced. Thoracentesis was performed. The catheter was removed and a dressing applied. FINDINGS: A total of approximately 1 liter of hazy, amber fluid was removed. Samples were sent to the laboratory as requested by the clinical team. IMPRESSION: Successful ultrasound guided diagnostic and therapeutic right thoracentesis yielding 1 liter of pleural fluid. Read by: Rowe Robert, PA-C Electronically Signed   By: Constance Holster M.D.   On: 11/04/2018 16:35    Subjective: No major events overnight of this morning.   No complaint this morning.  Denies chest pain, dyspnea, nausea, vomiting or abdominal pain.   Discharge Exam: Vitals:   11/06/18 2210 11/07/18 0457  BP: (!) 178/61 (!) 168/58  Pulse: 65 67  Resp: 14 15  Temp: 98.6 F (37 C) 99.3 F (37.4 C)  SpO2: 96% 90%    GENERAL: No acute distress.  Appears well.  HEENT: MMM.  Vision and hearing grossly intact.  NECK: Supple.  No JVD.  LUNGS:  No IWOB. Good air movement bilaterally. HEART:  RRR. Heart sounds normal.  ABD: Bowel sounds present. Soft. Non tender.  MSK/EXT: Left AKA. SKIN: no apparent skin lesion or wound NEURO: Awake, alert and oriented appropriately.  No gross deficit.  PSYCH: Calm. Normal affect.   The results of significant diagnostics from this hospitalization (including imaging, microbiology, ancillary and laboratory) are listed below for reference.     Microbiology: Recent Results (from the past 240 hour(s))  SARS CORONAVIRUS 2 (TAT 6-24 HRS) Nasopharyngeal Nasopharyngeal Swab     Status: None   Collection Time: 11/01/18  7:19 PM   Specimen: Nasopharyngeal Swab  Result Value Ref Range Status   SARS Coronavirus 2 NEGATIVE NEGATIVE Final    Comment: (NOTE) SARS-CoV-2 target nucleic acids are NOT DETECTED. The SARS-CoV-2 RNA is generally detectable in upper and lower respiratory specimens during the acute phase of infection. Negative results do not preclude SARS-CoV-2 infection, do not rule out co-infections with other pathogens, and should not be used as the sole basis for treatment or other patient management decisions. Negative results must be combined with clinical observations, patient history, and epidemiological information. The expected result is Negative. Fact Sheet for Patients: SugarRoll.be Fact Sheet for Healthcare Providers: https://www.woods-mathews.com/ This test is not yet approved or cleared by the [place] FDA and  has been authorized for detection  and/or diagnosis of SARS-CoV-2 by FDA under an Emergency Use Authorization (EUA). This EUA will remain  in effect (meaning this test can be used) for the duration of the COVID-19 declaration under Section 56 4(b)(1) of the Act, 21 U.S.C. section 360bbb-3(b)(1), unless the authorization is terminated or revoked sooner. Performed at [facility], [place] [address] [place], [place] [address]   Body fluid culture     Status: None (Preliminary result)   Collection Time: 11/04/18  4:14 PM   Specimen: PATH Cytology Pleural fluid  Result Value Ref Range Status   Specimen Description   Final    PLEURAL RIGHT Performed at [facility], [place] [address], [place], [place] [address]    Special Requests   Final    NONE Performed at [facility], [place] [address] [place], [place] [address]    Gram Stain   Final    RARE WBC PRESENT, PREDOMINANTLY MONONUCLEAR NO ORGANISMS SEEN    Culture   Final    NO GROWTH 3 DAYS Performed at [facility], [place] [address], [place], [place] [address]    Report Status PENDING  Incomplete  Novel Coronavirus, NAA (hospital order; send-out to ref lab)     Status: None   Collection Time: 11/05/18  5:33 PM   Specimen: Nasopharyngeal Swab; Respiratory  Result Value Ref Range Status   SARS-CoV-2, NAA NOT DETECTED NOT DETECTED Final    Comment: (NOTE) This nucleic acid amplification test was developed and its performance characteristics determined by [employer]. Nucleic acid amplification tests include PCR and TMA. This test has not been FDA cleared or approved. This test has been authorized by FDA under an Emergency Use Authorization (EUA). This test is only authorized for the duration of time the declaration that circumstances exist justifying the authorization of the emergency use of in vitro diagnostic tests for detection of SARS-CoV-2 virus and/or diagnosis of COVID-19 infection under section  564(b)(1) of the Act, 21 U.S.C. 151VOH-6(W) (1), unless the authorization is terminated or revoked sooner. When diagnostic testing is negative, the possibility of a false negative result should be considered in the context of a patient's recent exposures and the presence of clinical signs and symptoms consistent with COVID-19. An individual without symptoms of COVID- 19 and who is not shedding SARS-CoV-2 vi rus would expect to have a negative (not detected) result in this assay. Performed At: [facility] [place], [place] [id number] Rush Farmer MD SW:[id number]    [place]  Final    Comment: Performed at [place] [address] [place] [address], [place],  [address]     Labs: BNP (last 3 results) Recent Labs    12/03/17 1011 03/28/18 1758 06/23/18 2039  BNP 436.1* 171.7* 299.3*   Basic Metabolic Panel: Recent Labs  Lab 11/02/18 0531 11/03/18 0242 11/04/18 0458 11/05/18 0500 11/06/18 0454 11/07/18 0359  NA 144 142 142  --  142 143  K 3.0* 3.0* 3.5  --  3.3* 3.6  CL 113* 114* 114*  --  113* 114*  CO2 21* 21* 20*  --  20* 21*  GLUCOSE 135* 115* 114*  --  114* 116*  BUN 48* 44* 48*  --  49* 52*  CREATININE 2.69* 2.92* 2.77*  --  2.81* 2.83*  CALCIUM 8.0* 7.8* 7.9*  --  7.6* 7.7*  MG  --   --  2.0 1.9 1.9 2.0  PHOS  --   --   --   --  3.7  --    Liver Function Tests: Recent  Labs  Lab 11/02/18 0531 11/04/18 1712  AST 14* 13*  ALT 11 11  ALKPHOS 101 105  BILITOT 1.2 0.7  PROT 5.4* 5.4*  ALBUMIN 3.0* 2.8*   No results for input(s): LIPASE, AMYLASE in the last 168 hours. No results for input(s): AMMONIA in the last 168 hours. CBC: Recent Labs  Lab 11/01/18 1613  11/03/18 0242 11/04/18 0458 11/04/18 1712 11/05/18 0500 11/05/18 2301 11/06/18 0454 11/07/18 0359  WBC 4.1   < > 5.9 5.0  --  6.2  --  5.3 4.4  NEUTROABS 2.6  --  4.1  --   --   --   --   --   --   HGB 4.5*   < > 7.3* 7.1* 8.3*  7.8* 8.8* 8.6* 8.3*  HCT 14.8*   < > 23.4* 22.6* 26.1* 24.6* 28.0* 28.0* 26.9*  MCV 98.7   < > 95.1 96.2  --  96.1  --  96.9 95.4  PLT 87*   < > 86* 79*  --  82*  --  79* 83*   < > = values in this interval not displayed.   Cardiac Enzymes: No results for input(s): CKTOTAL, CKMB, CKMBINDEX, TROPONINI in the last 168 hours. BNP: Invalid input(s): POCBNP CBG: Recent Labs  Lab 11/06/18 2013 11/07/18 0129 11/07/18 0453 11/07/18 0727 11/07/18 1125  GLUCAP 143* 106* 117* 122* 105*   D-Dimer No results for input(s): DDIMER in the last 72 hours. Hgb A1c No results for input(s): HGBA1C in the last 72 hours. Lipid Profile No results for input(s): CHOL, HDL, LDLCALC, TRIG, CHOLHDL, LDLDIRECT in the last 72 hours. Thyroid function studies No results for input(s): TSH, T4TOTAL, T3FREE, THYROIDAB in the last 72 hours.  Invalid input(s): FREET3 Anemia work up No results for input(s): VITAMINB12, FOLATE, FERRITIN, TIBC, IRON, RETICCTPCT in the last 72 hours. Urinalysis    Component Value Date/Time   COLORURINE YELLOW 06/24/2018 0628   APPEARANCEUR CLOUDY (A) 06/24/2018 0628   LABSPEC 1.012 06/24/2018 0628   PHURINE 7.0 06/24/2018 0628   GLUCOSEU NEGATIVE 06/24/2018 0628   HGBUR NEGATIVE 06/24/2018 0628   BILIRUBINUR NEGATIVE 06/24/2018 0628   BILIRUBINUR NEG 06/26/2015 1540   KETONESUR NEGATIVE 06/24/2018 0628   PROTEINUR 100 (A) 06/24/2018 0628   UROBILINOGEN 1.0 06/26/2015 1540   UROBILINOGEN 0.2 23-Dec-202015 2156   NITRITE NEGATIVE 06/24/2018 0628   LEUKOCYTESUR LARGE (A) 06/24/2018 0628   Sepsis Labs Invalid input(s): PROCALCITONIN,  WBC,  LACTICIDVEN   Time coordinating discharge: 35 minutes  SIGNED:  Mercy Riding, MD  [facility] 11/07/2018, 11:34 AM  If 7PM-7AM, please contact night-coverage [email or web address] Password [email or web address]

## 2018-11-07 NOTE — TOC Transition Note (Signed)
Transition of Care [facility]) - CM/SW Discharge Note   Patient Details  Name: Leonard [name] MRN: [id number] Date of Birth: 26-Mar-1950  Transition of Care [facility]) CM/SW Contact:  Servando Snare, LCSW Phone Number: 11/07/2018, 11:41 AM   Clinical Narrative:   Patient to return to LTC facility, [facility]. Patient will return to room 113. RN report # [id number] [phone]    Final next level of care: Skilled Nursing Facility Barriers to Discharge: No Barriers Identified   Patient Goals and CMS Choice Patient states their goals for this hospitalization and ongoing recovery are:: Return to facility. CMS Medicare.gov Compare Post Acute Care list provided to:: Patient Choice offered to / list presented to : (Pt is a LTC resident)  Discharge Placement              Patient chooses bed at: ([facility] [place]) Patient to be transferred to facility by: EMS Name of family member notified: facility    Discharge Plan and Services                                     Social Determinants of Health ([place]) Interventions     Readmission Risk Interventions No flowsheet data found.

## 2018-11-07 NOTE — Progress Notes (Signed)
This RN attempted to call report x3 to [place] facility, each time the phone rang for a long time before beeping repeatedly. No VM for me to leave a message. I did speak to patient's sister Gillis Santa prior to D/C. Pt left via [facility] with all belongings. Pt A&O, VSS upon D/C.

## 2018-11-08 ENCOUNTER — Ambulatory Visit (HOSPITAL_COMMUNITY): Admission: RE | Admit: 2018-11-08 | Payer: Medicare Other | Source: Home / Self Care | Admitting: Gastroenterology

## 2018-11-08 ENCOUNTER — Encounter (HOSPITAL_COMMUNITY): Admission: RE | Payer: Self-pay | Source: Home / Self Care

## 2018-11-08 DIAGNOSIS — D649 Anemia, unspecified: Secondary | ICD-10-CM | POA: Diagnosis not present

## 2018-11-08 DIAGNOSIS — C801 Malignant (primary) neoplasm, unspecified: Secondary | ICD-10-CM | POA: Diagnosis not present

## 2018-11-08 DIAGNOSIS — D500 Iron deficiency anemia secondary to blood loss (chronic): Secondary | ICD-10-CM | POA: Diagnosis not present

## 2018-11-08 DIAGNOSIS — D002 Carcinoma in situ of stomach: Secondary | ICD-10-CM | POA: Diagnosis not present

## 2018-11-08 LAB — BODY FLUID CULTURE: Culture: NO GROWTH

## 2018-11-08 SURGERY — COLONOSCOPY WITH PROPOFOL
Anesthesia: Monitor Anesthesia Care

## 2018-11-10 LAB — CYTOLOGY - NON PAP

## 2018-11-15 ENCOUNTER — Other Ambulatory Visit: Payer: Self-pay

## 2018-11-15 ENCOUNTER — Encounter (HOSPITAL_COMMUNITY): Payer: Self-pay | Admitting: Emergency Medicine

## 2018-11-15 ENCOUNTER — Inpatient Hospital Stay (HOSPITAL_COMMUNITY)
Admission: EM | Admit: 2018-11-15 | Discharge: 2018-11-24 | DRG: 375 | Disposition: A | Discharge status: Skilled Nursing Facility | Payer: Medicare Other | Attending: Internal Medicine | Admitting: Internal Medicine

## 2018-11-15 DIAGNOSIS — D62 Acute posthemorrhagic anemia: Secondary | ICD-10-CM | POA: Diagnosis present

## 2018-11-15 DIAGNOSIS — Z79890 Hormone replacement therapy (postmenopausal): Secondary | ICD-10-CM

## 2018-11-15 DIAGNOSIS — J449 Chronic obstructive pulmonary disease, unspecified: Secondary | ICD-10-CM | POA: Diagnosis present

## 2018-11-15 DIAGNOSIS — D509 Iron deficiency anemia, unspecified: Secondary | ICD-10-CM | POA: Diagnosis present

## 2018-11-15 DIAGNOSIS — C163 Malignant neoplasm of pyloric antrum: Principal | ICD-10-CM | POA: Diagnosis present

## 2018-11-15 DIAGNOSIS — Z993 Dependence on wheelchair: Secondary | ICD-10-CM

## 2018-11-15 DIAGNOSIS — D649 Anemia, unspecified: Secondary | ICD-10-CM | POA: Diagnosis present

## 2018-11-15 DIAGNOSIS — Z515 Encounter for palliative care: Secondary | ICD-10-CM | POA: Diagnosis present

## 2018-11-15 DIAGNOSIS — D500 Iron deficiency anemia secondary to blood loss (chronic): Secondary | ICD-10-CM | POA: Diagnosis not present

## 2018-11-15 DIAGNOSIS — I2510 Atherosclerotic heart disease of native coronary artery without angina pectoris: Secondary | ICD-10-CM | POA: Diagnosis present

## 2018-11-15 DIAGNOSIS — I25119 Atherosclerotic heart disease of native coronary artery with unspecified angina pectoris: Secondary | ICD-10-CM

## 2018-11-15 DIAGNOSIS — E1122 Type 2 diabetes mellitus with diabetic chronic kidney disease: Secondary | ICD-10-CM | POA: Diagnosis present

## 2018-11-15 DIAGNOSIS — E1151 Type 2 diabetes mellitus with diabetic peripheral angiopathy without gangrene: Secondary | ICD-10-CM | POA: Diagnosis present

## 2018-11-15 DIAGNOSIS — E039 Hypothyroidism, unspecified: Secondary | ICD-10-CM | POA: Diagnosis present

## 2018-11-15 DIAGNOSIS — Z03818 Encounter for observation for suspected exposure to other biological agents ruled out: Secondary | ICD-10-CM | POA: Diagnosis not present

## 2018-11-15 DIAGNOSIS — M1A0720 Idiopathic chronic gout, left ankle and foot, without tophus (tophi): Secondary | ICD-10-CM | POA: Diagnosis present

## 2018-11-15 DIAGNOSIS — Z89612 Acquired absence of left leg above knee: Secondary | ICD-10-CM | POA: Diagnosis not present

## 2018-11-15 DIAGNOSIS — F17211 Nicotine dependence, cigarettes, in remission: Secondary | ICD-10-CM | POA: Diagnosis not present

## 2018-11-15 DIAGNOSIS — C169 Malignant neoplasm of stomach, unspecified: Secondary | ICD-10-CM

## 2018-11-15 DIAGNOSIS — D631 Anemia in chronic kidney disease: Secondary | ICD-10-CM | POA: Diagnosis present

## 2018-11-15 DIAGNOSIS — N184 Chronic kidney disease, stage 4 (severe): Secondary | ICD-10-CM | POA: Diagnosis not present

## 2018-11-15 DIAGNOSIS — Z85828 Personal history of other malignant neoplasm of skin: Secondary | ICD-10-CM

## 2018-11-15 DIAGNOSIS — Z8249 Family history of ischemic heart disease and other diseases of the circulatory system: Secondary | ICD-10-CM

## 2018-11-15 DIAGNOSIS — D630 Anemia in neoplastic disease: Secondary | ICD-10-CM | POA: Diagnosis present

## 2018-11-15 DIAGNOSIS — R188 Other ascites: Secondary | ICD-10-CM | POA: Diagnosis not present

## 2018-11-15 DIAGNOSIS — E11319 Type 2 diabetes mellitus with unspecified diabetic retinopathy without macular edema: Secondary | ICD-10-CM | POA: Diagnosis present

## 2018-11-15 DIAGNOSIS — D61818 Other pancytopenia: Secondary | ICD-10-CM | POA: Diagnosis present

## 2018-11-15 DIAGNOSIS — I5032 Chronic diastolic (congestive) heart failure: Secondary | ICD-10-CM | POA: Diagnosis present

## 2018-11-15 DIAGNOSIS — Z20828 Contact with and (suspected) exposure to other viral communicable diseases: Secondary | ICD-10-CM | POA: Diagnosis present

## 2018-11-15 DIAGNOSIS — E46 Unspecified protein-calorie malnutrition: Secondary | ICD-10-CM | POA: Diagnosis not present

## 2018-11-15 DIAGNOSIS — E782 Mixed hyperlipidemia: Secondary | ICD-10-CM | POA: Diagnosis present

## 2018-11-15 DIAGNOSIS — E119 Type 2 diabetes mellitus without complications: Secondary | ICD-10-CM | POA: Diagnosis not present

## 2018-11-15 DIAGNOSIS — I2721 Secondary pulmonary arterial hypertension: Secondary | ICD-10-CM | POA: Diagnosis present

## 2018-11-15 DIAGNOSIS — Z951 Presence of aortocoronary bypass graft: Secondary | ICD-10-CM

## 2018-11-15 DIAGNOSIS — D472 Monoclonal gammopathy: Secondary | ICD-10-CM | POA: Diagnosis present

## 2018-11-15 DIAGNOSIS — Z66 Do not resuscitate: Secondary | ICD-10-CM | POA: Diagnosis present

## 2018-11-15 DIAGNOSIS — N189 Chronic kidney disease, unspecified: Secondary | ICD-10-CM | POA: Diagnosis not present

## 2018-11-15 DIAGNOSIS — Z794 Long term (current) use of insulin: Secondary | ICD-10-CM

## 2018-11-15 DIAGNOSIS — Z741 Need for assistance with personal care: Secondary | ICD-10-CM | POA: Diagnosis not present

## 2018-11-15 DIAGNOSIS — F17210 Nicotine dependence, cigarettes, uncomplicated: Secondary | ICD-10-CM | POA: Diagnosis present

## 2018-11-15 DIAGNOSIS — I70202 Unspecified atherosclerosis of native arteries of extremities, left leg: Secondary | ICD-10-CM | POA: Diagnosis not present

## 2018-11-15 DIAGNOSIS — Z7189 Other specified counseling: Secondary | ICD-10-CM | POA: Diagnosis not present

## 2018-11-15 DIAGNOSIS — I10 Essential (primary) hypertension: Secondary | ICD-10-CM | POA: Diagnosis not present

## 2018-11-15 DIAGNOSIS — I82512 Chronic embolism and thrombosis of left femoral vein: Secondary | ICD-10-CM | POA: Diagnosis present

## 2018-11-15 DIAGNOSIS — Z7401 Bed confinement status: Secondary | ICD-10-CM | POA: Diagnosis not present

## 2018-11-15 DIAGNOSIS — M2550 Pain in unspecified joint: Secondary | ICD-10-CM | POA: Diagnosis not present

## 2018-11-15 DIAGNOSIS — I252 Old myocardial infarction: Secondary | ICD-10-CM

## 2018-11-15 DIAGNOSIS — Z79899 Other long term (current) drug therapy: Secondary | ICD-10-CM

## 2018-11-15 DIAGNOSIS — E1165 Type 2 diabetes mellitus with hyperglycemia: Secondary | ICD-10-CM | POA: Diagnosis present

## 2018-11-15 DIAGNOSIS — R5381 Other malaise: Secondary | ICD-10-CM | POA: Diagnosis not present

## 2018-11-15 DIAGNOSIS — I509 Heart failure, unspecified: Secondary | ICD-10-CM | POA: Diagnosis not present

## 2018-11-15 DIAGNOSIS — E876 Hypokalemia: Secondary | ICD-10-CM

## 2018-11-15 DIAGNOSIS — K922 Gastrointestinal hemorrhage, unspecified: Secondary | ICD-10-CM

## 2018-11-15 DIAGNOSIS — E1142 Type 2 diabetes mellitus with diabetic polyneuropathy: Secondary | ICD-10-CM | POA: Diagnosis present

## 2018-11-15 DIAGNOSIS — Z880 Allergy status to penicillin: Secondary | ICD-10-CM

## 2018-11-15 DIAGNOSIS — I130 Hypertensive heart and chronic kidney disease with heart failure and stage 1 through stage 4 chronic kidney disease, or unspecified chronic kidney disease: Secondary | ICD-10-CM | POA: Diagnosis present

## 2018-11-15 DIAGNOSIS — J9611 Chronic respiratory failure with hypoxia: Secondary | ICD-10-CM | POA: Diagnosis present

## 2018-11-15 DIAGNOSIS — Z6827 Body mass index (BMI) 27.0-27.9, adult: Secondary | ICD-10-CM | POA: Diagnosis not present

## 2018-11-15 DIAGNOSIS — Z8709 Personal history of other diseases of the respiratory system: Secondary | ICD-10-CM | POA: Diagnosis not present

## 2018-11-15 DIAGNOSIS — I272 Pulmonary hypertension, unspecified: Secondary | ICD-10-CM | POA: Diagnosis not present

## 2018-11-15 DIAGNOSIS — Z8719 Personal history of other diseases of the digestive system: Secondary | ICD-10-CM | POA: Diagnosis not present

## 2018-11-15 DIAGNOSIS — Z923 Personal history of irradiation: Secondary | ICD-10-CM

## 2018-11-15 LAB — CBC
HCT: 18.8 % — ABNORMAL LOW (ref 39.0–52.0)
HCT: 17.4 % — ABNORMAL LOW (ref 39.0–52.0)
Hemoglobin: 5.8 g/dL — CL (ref 13.0–17.0)
Hemoglobin: 5.4 g/dL — CL (ref 13.0–17.0)
MCH: 29.4 pg (ref 26.0–34.0)
MCH: 29.7 pg (ref 26.0–34.0)
MCHC: 30.9 g/dL (ref 30.0–36.0)
MCHC: 31 g/dL (ref 30.0–36.0)
MCV: 95.4 fL (ref 80.0–100.0)
MCV: 95.6 fL (ref 80.0–100.0)
Platelets: 110 10*3/uL — ABNORMAL LOW (ref 150–400)
Platelets: 95 10*3/uL — ABNORMAL LOW (ref 150–400)
RBC: 1.97 MIL/uL — ABNORMAL LOW (ref 4.22–5.81)
RBC: 1.82 MIL/uL — ABNORMAL LOW (ref 4.22–5.81)
RDW: 15.2 % (ref 11.5–15.5)
RDW: 15.3 % (ref 11.5–15.5)
WBC: 3.5 10*3/uL — ABNORMAL LOW (ref 4.0–10.5)
WBC: 3.4 10*3/uL — ABNORMAL LOW (ref 4.0–10.5)
nRBC: 0 % (ref 0.0–0.2)
nRBC: 0 % (ref 0.0–0.2)

## 2018-11-15 LAB — COMPREHENSIVE METABOLIC PANEL
ALT: 13 U/L (ref 0–44)
AST: 17 U/L (ref 15–41)
Albumin: 2.9 g/dL — ABNORMAL LOW (ref 3.5–5.0)
Alkaline Phosphatase: 96 U/L (ref 38–126)
Anion gap: 7 (ref 5–15)
BUN: 49 mg/dL — ABNORMAL HIGH (ref 8–23)
CO2: 20 mmol/L — ABNORMAL LOW (ref 22–32)
Calcium: 7.8 mg/dL — ABNORMAL LOW (ref 8.9–10.3)
Chloride: 114 mmol/L — ABNORMAL HIGH (ref 98–111)
Creatinine, Ser: 2.56 mg/dL — ABNORMAL HIGH (ref 0.61–1.24)
GFR calc Af Amer: 29 mL/min — ABNORMAL LOW (ref >60)
GFR calc non Af Amer: 25 mL/min — ABNORMAL LOW (ref >60)
Glucose, Bld: 204 mg/dL — ABNORMAL HIGH (ref 70–99)
Potassium: 3.3 mmol/L — ABNORMAL LOW (ref 3.5–5.1)
Sodium: 141 mmol/L (ref 135–145)
Total Bilirubin: 0.7 mg/dL (ref 0.3–1.2)
Total Protein: 5.6 g/dL — ABNORMAL LOW (ref 6.5–8.1)

## 2018-11-15 LAB — SARS CORONAVIRUS 2 BY RT PCR (HOSPITAL ORDER, PERFORMED IN ~~LOC~~ HOSPITAL LAB): SARS Coronavirus 2: NEGATIVE

## 2018-11-15 LAB — PREPARE RBC (CROSSMATCH)

## 2018-11-15 MED ORDER — CARVEDILOL 12.5 MG PO TABS
12.5000 mg | ORAL_TABLET | Freq: Two times a day (BID) | ORAL | Status: DC
  Administered 2018-11-15 – 2018-11-24 (×18): 12.5 mg via ORAL
  Filled 2018-11-15 (×19): qty 1

## 2018-11-15 MED ORDER — INSULIN ASPART 100 UNIT/ML ~~LOC~~ SOLN
0.0000 [IU] | Freq: Three times a day (TID) | SUBCUTANEOUS | Status: DC
  Administered 2018-11-17 – 2018-11-19 (×5): 1 [IU] via SUBCUTANEOUS
  Administered 2018-11-20: 2 [IU] via SUBCUTANEOUS
  Administered 2018-11-20 – 2018-11-22 (×3): 1 [IU] via SUBCUTANEOUS
  Administered 2018-11-22 – 2018-11-23 (×2): 2 [IU] via SUBCUTANEOUS
  Administered 2018-11-23 – 2018-11-24 (×2): 1 [IU] via SUBCUTANEOUS
  Filled 2018-11-15: qty 0.09

## 2018-11-15 MED ORDER — HYDRALAZINE HCL 50 MG PO TABS
100.0000 mg | ORAL_TABLET | Freq: Three times a day (TID) | ORAL | Status: DC
  Administered 2018-11-15 – 2018-11-24 (×26): 100 mg via ORAL
  Filled 2018-11-15 (×28): qty 2

## 2018-11-15 MED ORDER — UMECLIDINIUM BROMIDE 62.5 MCG/INH IN AEPB
1.0000 | INHALATION_SPRAY | Freq: Every day | RESPIRATORY_TRACT | Status: DC
  Filled 2018-11-15: qty 7

## 2018-11-15 MED ORDER — ISOSORBIDE MONONITRATE ER 60 MG PO TB24
120.0000 mg | ORAL_TABLET | Freq: Every day | ORAL | Status: DC
  Administered 2018-11-16 – 2018-11-24 (×9): 120 mg via ORAL
  Filled 2018-11-15 (×10): qty 2

## 2018-11-15 MED ORDER — LEVOTHYROXINE SODIUM 100 MCG PO TABS
100.0000 ug | ORAL_TABLET | Freq: Every day | ORAL | Status: DC
  Administered 2018-11-16 – 2018-11-24 (×9): 100 ug via ORAL
  Filled 2018-11-15 (×3): qty 1
  Filled 2018-11-15: qty 2
  Filled 2018-11-15 (×7): qty 1

## 2018-11-15 MED ORDER — PANTOPRAZOLE SODIUM 40 MG IV SOLR
40.0000 mg | Freq: Every day | INTRAVENOUS | Status: DC
  Administered 2018-11-16 – 2018-11-21 (×7): 40 mg via INTRAVENOUS
  Filled 2018-11-15 (×7): qty 40

## 2018-11-15 MED ORDER — FERROUS SULFATE 325 (65 FE) MG PO TABS
325.0000 mg | ORAL_TABLET | Freq: Every day | ORAL | Status: DC
  Administered 2018-11-16 – 2018-11-24 (×9): 325 mg via ORAL
  Filled 2018-11-15 (×9): qty 1

## 2018-11-15 MED ORDER — AMLODIPINE BESYLATE 5 MG PO TABS
5.0000 mg | ORAL_TABLET | Freq: Every day | ORAL | Status: DC
  Administered 2018-11-16 – 2018-11-18 (×3): 5 mg via ORAL
  Filled 2018-11-15 (×3): qty 1

## 2018-11-15 MED ORDER — DOXAZOSIN MESYLATE 4 MG PO TABS
8.0000 mg | ORAL_TABLET | Freq: Every evening | ORAL | Status: DC
  Administered 2018-11-15 – 2018-11-23 (×9): 8 mg via ORAL
  Filled 2018-11-15 (×6): qty 2
  Filled 2018-11-15 (×2): qty 1
  Filled 2018-11-15 (×2): qty 2

## 2018-11-15 MED ORDER — SODIUM CHLORIDE 0.9 % IV SOLN
INTRAVENOUS | Status: AC
  Administered 2018-11-15: 21:00:00 via INTRAVENOUS

## 2018-11-15 MED ORDER — TORSEMIDE 20 MG PO TABS
40.0000 mg | ORAL_TABLET | Freq: Every day | ORAL | Status: DC
  Administered 2018-11-16: 40 mg via ORAL
  Filled 2018-11-15: qty 2

## 2018-11-15 MED ORDER — POTASSIUM CHLORIDE 10 MEQ/100ML IV SOLN
10.0000 meq | Freq: Once | INTRAVENOUS | Status: AC
  Administered 2018-11-15: 10 meq via INTRAVENOUS
  Filled 2018-11-15: qty 100

## 2018-11-15 MED ORDER — FOLIC ACID 1 MG PO TABS
1.0000 mg | ORAL_TABLET | Freq: Every day | ORAL | Status: DC
  Administered 2018-11-16 – 2018-11-24 (×9): 1 mg via ORAL
  Filled 2018-11-15 (×9): qty 1

## 2018-11-15 MED ORDER — SODIUM CHLORIDE 0.9% IV SOLUTION
Freq: Once | INTRAVENOUS | Status: AC
  Administered 2018-11-15: 21:00:00 via INTRAVENOUS

## 2018-11-15 MED ORDER — ATORVASTATIN CALCIUM 40 MG PO TABS
40.0000 mg | ORAL_TABLET | Freq: Every day | ORAL | Status: DC
  Administered 2018-11-16 – 2018-11-23 (×8): 40 mg via ORAL
  Filled 2018-11-15 (×9): qty 1

## 2018-11-15 NOTE — ED Triage Notes (Signed)
Per EMS pt from [place] with low hemoglobin 5.6.

## 2018-11-15 NOTE — ED Notes (Signed)
Date and time results received: 11/15/18 2001 (use smartphrase ".now" to insert current time)  Test: Hbg Critical Value: 5.4  Name of Provider Notified: Schorr, NP  Orders Received? Or Actions Taken?: Orders Received - See Orders for details

## 2018-11-15 NOTE — ED Notes (Signed)
Pharmacy contacted to validate BP mediations

## 2018-11-15 NOTE — H&P (Addendum)
History and Physical    Leonard [name] OZD:[id number] DOB: 1950-06-11 DOA: 11/15/2018  PCP: Kathrene Alu, MD  Patient coming from: [place] home  I have personally briefly reviewed patient's old medical records in [place]  Chief Complaint: Abnormal labs  HPI: Leonard [name] is a 68 y.o. male with medical history significant of diastolic CHF, hypertension, COPD, PVD s/p left AKA, CKD stage IV, type 2 diabetes and hx of GI bleed from gastric adenocarcinoma with hx of XRT who presented on routine lab work at [facility] with Hgb of 5.8.  He denies any lightheadedness dizziness.  No chest pain or shortness of breath.  No abdominal pain, nausea vomiting or diarrhea.  States he has not noticed any blood or dark tarry stool.  Otherwise, patient was a poor historian and unable to provide much HPI.  He was recently hospitalized from 9/28 to 10/4 for symptomatic anemia with hemoglobin down to 4.7.  Received 4 units PRBC throughout his hospitalization.  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  General surgery was also consulted.  Discharge hemoglobin of 8.3. He also underwent thoracentesis for right pleural effusion with removal of 1 L transudate of fluid.  Patient apparently went to see general surgery today to discuss possible palliative gastrectomy versus radiation.  Patient is unsure which treatment he would like to undergo.  ED Course: He had mildly elevated temperature of 99.3 and was hypertensive up to 186/68 on room air.  CBC showed leukopenia of 3.5 and hemoglobin of 5.8 from a prior of 8.38 days ago.  Platelet at 110 which is improved from 83 8 days ago.  CMP showed low potassium of 3.3, elevated glucose of 204, creatinine of 2.56.  GI and General surgery consulted by EDP.   Review of Systems:  Constitutional: No Weight Change, No Fever ENT/Mouth: No sore throat, No  Rhinorrhea Eyes: No Eye Pain, No Vision Changes Cardiovascular: No Chest Pain, no SOB Respiratory: No Cough, No Sputum, No Wheezing, no Dyspnea  Gastrointestinal: No Nausea, No Vomiting, No Diarrhea, No Constipation, No Pain Genitourinary: no Urinary Incontinence, No Urgency, No Flank Pain Musculoskeletal: No Arthralgias, No Myalgias Skin: No Skin Lesions, No Pruritus, Neuro: no Weakness, No Numbness,  No Loss of Consciousness, No Syncope Psych: No Anxiety/Panic, No Depression, no decrease appetite Heme/Lymph: No Bruising, No Bleeding  Past Medical History:  Diagnosis Date   Acute on chronic diastolic heart failure (HCC) 07/04/2014   Anemia of renal disease    Atherosclerotic peripheral vascular disease with gangrene ([place]) 02/14/2017   CAD (coronary artery disease) 04/06/2013   Cancer (HCC)    skin cancer-   CHF (congestive heart failure) (HCC)    Chronic anemia 02/28/2016   Chronic deep vein thrombosis (DVT) of femoral vein of left lower extremity (HCC) 09/03/2016   Chronic respiratory failure with hypoxia (HCC) 08/25/2017   CKD (chronic kidney disease) stage 4, GFR 15-29 ml/min (HCC) 01/15/2015   Follows with [facility].  Last visits:  07/23/16: No change in meds. Worried that if we increase diuretics could lead to another AKI   CKD (chronic kidney disease), stage IV ([place])    07/07/2017   Claudication ([place])    Collapse of left talus 01/14/2017   COPD GOLD III  02/16/2014   Quit smoking 2014  - PFT's  04/06/13   FEV1 1.28 (44 % ) ratio 48  p no % improvement from  saba p ?  prior to study with DLCO  43/54 % corrects to 97  % for alv volume - 01/31/2016  After extensive coaching HFA effectiveness =    75% from baseline 0 > changed to sprivia respimat   - PFT's  03/17/2016  FEV1 2.25  (81 % ) ratio 88  p 2 % improvement from saba p spiriva respimat x 2  prior to study with   Coronary artery disease    Diabetes ([place])    Diabetic polyneuropathy associated with type 2 diabetes  mellitus ([place]) 01/14/2017   Diabetic retinopathy ([place]) 11/13/2015   Per Dr. Erenest Rasher eye exam in 10/17/15, mild background diabetic retinopathy   Fatigue associated with anemia 01/21/2016   Gangrene of toe ([place])    right 5th/notes 01/04/2013    HCAP (healthcare-associated pneumonia)    Hematuria 06/27/2015   High cholesterol    History of tobacco use 08/23/2013   Smoked pack per day for 46 years Quit smoking - 02/03/2013    Hypertension    Hypothyroidism 11/22/2014   Idiopathic chronic gout of left foot without tophus    Iron deficiency anemia 08/03/2015   Left foot pain 10/29/2016   Long term (current) use of anticoagulants [Z79.01] 09/09/2016   MGUS (monoclonal gammopathy of unknown significance) 01/15/2015   Mixed hyperlipidemia 07/17/2008   Morbid obesity due to excess calories ([place]) 03/17/2016   PAD (peripheral artery disease) (HCC)    Pleural effusion    Pneumonitis 07/26/2017   Pulmonary artery hypertension ([place]) 07/25/2017   PVD - hx of Rt SFA PTA and s/p Rt 4-5th toe amp Dec 2014 04/05/2013   Resistant hypertension 11/25/2012   Respiratory failure ([place]) 08/13/2017   Respiratory failure with hypoxia ([place]) 09/23/2017   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   Secondary hypertension    Shortness of breath dyspnea    with exertion   Skin lesion of face 02/22/2015   SOB (shortness of breath)    Type 2 diabetes mellitus with pressure callus ([place]) 12/29/2016   Type II diabetes mellitus ([place])    Type II   Type II diabetes mellitus with complication [facility])     Past Surgical History:  Procedure Laterality Date   ABDOMINAL AORTOGRAM W/LOWER EXTREMITY N/A 03/12/2017   Procedure: ABDOMINAL AORTOGRAM W/LOWER EXTREMITY;  Surgeon: Conrad [place], MD;  Location: [place] [facility];  Service: Cardiovascular;  Laterality: N/A;   AMPUTATION Right 01/28/2013   Procedure: RAY AMPUTATION RIGHT  4th & 5th TOE;  Surgeon: Rosetta Posner, MD;  Location: [place];  Service: Vascular;  Laterality:  Right;   AMPUTATION Left 03/15/2017   Procedure: AMPUTATION ABOVE KNEE;  Surgeon: Waynetta Sandy, MD;  Location: [place];  Service: Vascular;  Laterality: Left;   ANGIOPLASTY / STENTING FEMORAL Right 01/13/2013   superficial femoral artery x 2 (3 mm x 60 mm, 4 mm x 60 mm)   AV FISTULA PLACEMENT Left 02/11/2016   Procedure: LEFT UPPER ARM BRACHIAL CEPHALIC ARTERIOVENOUS (AV) FISTULA CREATION;  Surgeon: Conrad Morenci, MD;  Location: [place];  Service: Vascular;  Laterality: Left;   BIOPSY  03/29/2018   Procedure: BIOPSY;  Surgeon: Irving Copas., MD;  Location: [place];  Service: Gastroenterology;;   BIOPSY  05/31/2018   Procedure: BIOPSY;  Surgeon: Irving Copas., MD;  Location: [place];  Service: Gastroenterology;;   COLONOSCOPY W/ POLYPECTOMY     CORONARY ARTERY BYPASS GRAFT N/A 04/07/2013   Procedure: CORONARY ARTERY BYPASS GRAFTING (CABG);  Surgeon:  Ivin Poot, MD;  Location: [place];  Service: Open Heart Surgery;  Laterality: N/A;   ESOPHAGOGASTRODUODENOSCOPY (EGD) WITH PROPOFOL N/A 03/29/2018   Procedure: ESOPHAGOGASTRODUODENOSCOPY (EGD) WITH PROPOFOL;  Surgeon: Rush Landmark Telford Nab., MD;  Location: [place];  Service: Gastroenterology;  Laterality: N/A;   ESOPHAGOGASTRODUODENOSCOPY (EGD) WITH PROPOFOL N/A 05/31/2018   Procedure: ESOPHAGOGASTRODUODENOSCOPY (EGD) WITH PROPOFOL;  Surgeon: Rush Landmark Telford Nab., MD;  Location: [place];  Service: Gastroenterology;  Laterality: N/A;   ESOPHAGOGASTRODUODENOSCOPY (EGD) WITH PROPOFOL N/A 11/02/2018   Procedure: ESOPHAGOGASTRODUODENOSCOPY (EGD) WITH PROPOFOL;  Surgeon: Jerene Bears, MD;  Location: [facility];  Service: Gastroenterology;  Laterality: N/A;   EUS N/A 05/31/2018   Procedure: UPPER ENDOSCOPIC ULTRASOUND (EUS) RADIAL;  Surgeon: Irving Copas., MD;  Location: [place];  Service: Gastroenterology;  Laterality: N/A;   INTRAOPERATIVE TRANSESOPHAGEAL ECHOCARDIOGRAM N/A  04/07/2013   Procedure: INTRAOPERATIVE TRANSESOPHAGEAL ECHOCARDIOGRAM;  Surgeon: Ivin Poot, MD;  Location: [place];  Service: Open Heart Surgery;  Laterality: N/A;   IR FLUORO GUIDE CV LINE RIGHT  11/04/2017   IR REMOVAL TUN CV CATH W/O FL  11/27/2017   IR US GUIDE VASC ACCESS RIGHT  11/04/2017   LEFT HEART CATHETERIZATION WITH CORONARY ANGIOGRAM N/A 04/05/2013   Procedure: LEFT HEART CATHETERIZATION WITH CORONARY ANGIOGRAM;  Surgeon: Peter M Martinique, MD;  Location: [facility] CATH LAB;  Service: Cardiovascular;  Laterality: N/A;   LOWER EXTREMITY ANGIOGRAM Bilateral 01/06/2013   Procedure: LOWER EXTREMITY ANGIOGRAM;  Surgeon: Conrad [place], MD;  Location: [facility] CATH LAB;  Service: Cardiovascular;  Laterality: Bilateral;   LOWER EXTREMITY ANGIOGRAM Right 01/13/2013   Procedure: LOWER EXTREMITY ANGIOGRAM;  Surgeon: Conrad Lloyd, MD;  Location: [facility] CATH LAB;  Service: Cardiovascular;  Laterality: Right;   SKIN CANCER EXCISION  2017   THROAT SURGERY  1983   polyps   TONSILLECTOMY AND ADENOIDECTOMY  ~ 1965     reports that he has been smoking cigarettes. He started smoking about 51 years ago. He has a 46.00 pack-year smoking history. He has never used smokeless tobacco. He reports that he does not drink alcohol or use drugs.  Allergies  Allergen Reactions   Penicillins Other (See Comments)    Has patient had a PCN reaction causing immediate rash, facial/tongue/throat swelling, SOB or lightheadedness with hypotension: Unknown Has patient had a PCN reaction causing severe rash involving mucus membranes or skin necrosis: Unknown Has patient had a PCN reaction that required hospitalization: Unknown Has patient had a PCN reaction occurring within the last 10 years: Unknown If all of the above answers are "NO", then may proceed with Cephalosporin use.     Family History  Problem Relation Age of Onset   Cancer Mother        lung cancer   Hypertension Mother    Cancer Sister        patient thinks  it was uterine cancer   Hypertension Sister    Heart attack Sister    Stroke Neg Hx    Colon cancer Neg Hx    Esophageal cancer Neg Hx    Inflammatory bowel disease Neg Hx    Pancreatic cancer Neg Hx    Liver disease Neg Hx    Rectal cancer Neg Hx    Stomach cancer Neg Hx    Family history reviewed and not pertinent   Prior to Admission medications   Medication Sig Start Date End Date Taking? Authorizing Provider  acetaminophen (TYLENOL) 325 MG tablet Take 650 mg by mouth every 6 (  six) hours as needed for fever.   Yes [provider]  amLODipine (NORVASC) 5 MG tablet Take 1 tablet (5 mg total) by mouth daily. 07/12/18  Yes Tammi Klippel, Sherin, DO  ascorbic acid (VITAMIN C) 500 MG tablet Take 500 mg by mouth daily.   Yes [provider]  atorvastatin (LIPITOR) 40 MG tablet TAKE ONE (1) TABLET BY MOUTH EVERY DAY AT 6:00PM Patient taking differently: Take 40 mg by mouth daily at 6 PM.  11/17/16  Yes Larey Dresser, MD  carvedilol (COREG) 12.5 MG tablet Take 1 tablet (12.5 mg total) by mouth 2 (two) times daily with a meal. 08/30/17  Yes Meccariello, Bernita Raisin, DO  cholecalciferol (VITAMIN D) 25 MCG (1000 UT) tablet Take 2,000 Units by mouth 3 (three) times daily.   Yes [provider]  cloNIDine (CATAPRES - DOSED IN MG/24 HR) 0.2 mg/24hr patch Place 1 patch (0.2 mg total) onto the skin once a week. 08/09/17  Yes Steve Rattler, DO  Darbepoetin Alfa (ARANESP) 40 MCG/0.4ML SOSY injection Inject 0.4 mLs (40 mcg total) into the skin every 30 (thirty) days. 09/28/17  Yes Meccariello, Bernita Raisin, DO  doxazosin (CARDURA) 8 MG tablet Take 1 tablet (8 mg total) by mouth every evening. Patient taking differently: Take 8 mg by mouth daily.  12/01/16  Yes Hensel, Jamal Collin, MD  feeding supplement, ENSURE ENLIVE, (ENSURE ENLIVE) LIQD Take 237 mLs by mouth 3 (three) times daily between meals. 08/07/17  Yes Riccio, Angela C, DO  ferrous sulfate (FEROSUL) 325 (65 FE) MG tablet Take  1 tablet (325 mg total) by mouth daily at 12 noon. Patient taking differently: Take 325 mg by mouth daily.  08/31/17  Yes Meccariello, Bernita Raisin, DO  folic acid (FOLVITE) 1 MG tablet Take 1 tablet (1 mg total) by mouth daily. 11/06/18 11/06/19 Yes Mercy Riding, MD  Glycopyrrolate (LONHALA MAGNAIR STARTER KIT) 25 MCG/ML SOLN Inhale 1 mL into the lungs 2 (two) times a day.   Yes [provider]  hydrALAZINE (APRESOLINE) 100 MG tablet TAKE ONE TABLET BY MOUTH EVERY EIGHT HOURS Patient taking differently: Take 100 mg by mouth every 8 (eight) hours.  11/14/16  Yes [name], MD  insulin aspart (NOVOLOG) 100 UNIT/ML injection Inject 0-10 Units into the skin 3 (three) times daily before meals. <150= 0 units, 151-200= 2 units, 201-250= 4 units, 251-300= 6 units, 301-350= 8 units, 351-400= 10 units   Yes [provider]  isosorbide mononitrate (IMDUR) 120 MG 24 hr tablet Take 1 tablet (120 mg total) by mouth daily. 10/02/17  Yes Anderson, Chelsey L, DO  levothyroxine (SYNTHROID, LEVOTHROID) 100 MCG tablet Take 100 mcg by mouth daily before breakfast.    Yes [provider]  nitroGLYCERIN (NITROSTAT) 0.4 MG SL tablet Place 1 tablet (0.4 mg total) under the tongue every 5 (five) minutes as needed for chest pain. Patient taking differently: Place 0.4 mg under the tongue every 5 (five) minutes as needed for chest pain. Chest pain 11/14/16  Yes [name], MD  ondansetron (ZOFRAN) 4 MG tablet Take 4 mg by mouth every 8 (eight) hours as needed for nausea or vomiting.   Yes [provider]  pantoprazole (PROTONIX) 40 MG tablet Take 1 tablet (40 mg total) by mouth 2 (two) times daily. 04/03/18  Yes Beard, Aldona Bar N, DO  polyethylene glycol (MIRALAX / GLYCOLAX) packet Take 17 g by mouth daily as needed for mild constipation or moderate constipation. 08/07/17  Yes Lucila Maine  C, DO  sucralfate (CARAFATE) 1 g tablet Take 1 tablet (1 g total) by mouth 4 (four) times  daily -  with meals and at bedtime. 11/06/18  Yes Mercy Riding, MD  torsemide (DEMADEX) 20 MG tablet Take 2 tablets (40 mg total) by mouth daily. 02/16/18  Yes Larey Dresser, MD  umeclidinium bromide (INCRUSE ELLIPTA) 62.5 MCG/INH AEPB Inhale 1 puff into the lungs daily. 06/28/18  Yes Mullis, Kiersten P, DO  Blood Glucose Monitoring Suppl (ONETOUCH VERIO FLEX SYSTEM) w/Device KIT 1 kit by Does not apply route daily. 10/10/16   McDiarmid, Blane Ohara, MD  glucose blood (ONETOUCH VERIO) test strip Use as instructed to test three times daily. ICD-10 code: E11.22 10/15/16   McDiarmid, Blane Ohara, MD  [facility] DELICA LANCETS 50K MISC 1 each by Does not apply route 3 (three) times daily. ICD-10 code: E11.22 Patient not taking: Reported on 07/15/2018 10/15/16   McDiarmid, Blane Ohara, MD    Physical Exam: Vitals:   11/15/18 1605 11/15/18 1610 11/15/18 1730 11/15/18 1900  BP:  (!) 177/66 (!) 186/68 (!) 147/54  Pulse:  81 79 76  Resp:  16  18  Temp:  99.3 F (37.4 C)    TempSrc:  Oral    SpO2: 96% 94% 93% 92%    Constitutional: NAD, calm, comfortable, thin male laying at 30 degree incline in bed with frequent yawning and drifting asleep. Vitals:   11/15/18 1605 11/15/18 1610 11/15/18 1730 11/15/18 1900  BP:  (!) 177/66 (!) 186/68 (!) 147/54  Pulse:  81 79 76  Resp:  16  18  Temp:  99.3 F (37.4 C)    TempSrc:  Oral    SpO2: 96% 94% 93% 92%   Eyes: PERRL, lids and conjunctivae normal ENMT: Mucous membranes are moist.  Neck: normal, supple, no masses Respiratory: clear to auscultation bilaterally, no wheezing, no crackles. Normal respiratory effort. No accessory muscle use.  Cardiovascular: Regular rate and rhythm, no murmurs / rubs / gallops. No extremity edema. 2+ pedal pulses.  Abdomen: no tenderness, no masses palpated.  Bowel sounds positive.  Musculoskeletal: no clubbing / cyanosis. Left LE AKA.  Skin: no rashes, lesions, ulcers. No induration Neurologic: CN 2-12 grossly intact. Sensation intact.    Psychiatric: Normal judgment and insight. Alert and oriented x 3. Normal mood.     Labs on Admission: I have personally reviewed following labs and imaging studies  CBC: Recent Labs  Lab 11/15/18 1627  WBC 3.5*  HGB 5.8*  HCT 18.8*  MCV 95.4  PLT 938*   Basic Metabolic Panel: Recent Labs  Lab 11/15/18 1627  NA 141  K 3.3*  CL 114*  CO2 20*  GLUCOSE 204*  BUN 49*  CREATININE 2.56*  CALCIUM 7.8*   GFR: Estimated Creatinine Clearance: 29.4 mL/min (A) (by C-G formula based on SCr of 2.56 mg/dL (H)). Liver Function Tests: Recent Labs  Lab 11/15/18 1627  AST 17  ALT 13  ALKPHOS 96  BILITOT 0.7  PROT 5.6*  ALBUMIN 2.9*   No results for input(s): LIPASE, AMYLASE in the last 168 hours. No results for input(s): AMMONIA in the last 168 hours. Coagulation Profile: No results for input(s): INR, PROTIME in the last 168 hours. Cardiac Enzymes: No results for input(s): CKTOTAL, CKMB, CKMBINDEX, TROPONINI in the last 168 hours. BNP (last 3 results) No results for input(s): PROBNP in the last 8760 hours. HbA1C: No results for input(s): HGBA1C in the last 72 hours. CBG: No results for input(s):  GLUCAP in the last 168 hours. Lipid Profile: No results for input(s): CHOL, HDL, LDLCALC, TRIG, CHOLHDL, LDLDIRECT in the last 72 hours. Thyroid Function Tests: No results for input(s): TSH, T4TOTAL, FREET4, T3FREE, THYROIDAB in the last 72 hours. Anemia Panel: No results for input(s): VITAMINB12, FOLATE, FERRITIN, TIBC, IRON, RETICCTPCT in the last 72 hours. Urine analysis:    Component Value Date/Time   COLORURINE YELLOW 06/24/2018 0628   APPEARANCEUR CLOUDY (A) 06/24/2018 0628   LABSPEC 1.012 06/24/2018 0628   PHURINE 7.0 06/24/2018 0628   GLUCOSEU NEGATIVE 06/24/2018 0628   HGBUR NEGATIVE 06/24/2018 0628   BILIRUBINUR NEGATIVE 06/24/2018 0628   BILIRUBINUR NEG 06/26/2015 1540   KETONESUR NEGATIVE 06/24/2018 0628   PROTEINUR 100 (A) 06/24/2018 0628   UROBILINOGEN 1.0  06/26/2015 1540   UROBILINOGEN 0.2 2020/09/2713 2156   NITRITE NEGATIVE 06/24/2018 0628   LEUKOCYTESUR LARGE (A) 06/24/2018 0628    Radiological Exams on Admission: No results found.    Assessment/Plan Acute on chronic blood loss anemia due to GI bleed in the setting of gastric adenocarcinoma -Patient recently discharged on 10/4 for the same and discharged with hemoglobin of 8.3 -Hemoglobin of 5.8 on admission -Will transfuse 2 units PRBC -IV PPI -Patient had outpatient follow-up with general surgery [name] to discuss possible palliative gastroectomy. Previously deemed not a candidate for surgical intervention or chemo. General surgery is consulted and had discussed that he has high ACS risk and also recommends palliative care - will consult palliative care for goals of care discussion - GI consulted and will evaluate in the morning  Pancytopenia - chronic likely secondary to burden from gastric malignancy  Hypokalemia -Potassium 3.3 - replete  Chronic kidney disease stage IV -Stable.  Admission creatinine of 2.56 -Avoid nephrotoxic agent  Type 2 diabetes -Low-dose sliding scale  Hypothyroidism -Continue Synthroid  Hypertension -Continue amlodipine -Continue doxazosin -Continue hydralazine  History of CAD -Continue imdur, torsemide  Chronic COPD -Continue bronchodilator  Hypothyroidism -Continue levothyroxine  PVD s/p left BKA -Not on aspirin  DVT prophylaxis:SCD Code Status:Full Family Communication: Plan discussed with patient at bedside  disposition Plan: Home with at least 2 midnight stays  Consults called: General surg, GI Admission status: inpatient   Saba Neuman T Aulani Shipton DO [facility]   If 7PM-7AM, please contact night-coverage [email or web address] Password [email or web address]  11/15/2018, 7:30 PM

## 2018-11-15 NOTE — Progress Notes (Addendum)
Patient ID: Leonard [name], male   DOB: Jan 06, 1951, 68 y.o.   MRN: [id number]  [facility]  Patient was seen earlier this afternoon in the [facility] by [name].  Her note is attached.  tmg  SEM MCCAUGHEY Documented: 11/15/2018 1:53 PM Location: [facility] Patient #: [id number] DOB: [birth year] Single / Language: Leonard [name] / Race: Black or African American Male  History of Present Illness Stark Klein MD; 11/15/2018 3:37 PM) The patient is a 68 year old male who presents for a follow-up for Gastric cancer. Patient is a 68 year old male who was seen by the surgical service in the hospital in late February. He presented to the emergency department coming from a skilled nursing facility with symptomatic anemia. Hemoglobin was 5.9. He also had hematemesis. He required transfusion and received an EGD showing gastric cancer. No metastatic disease was seen on CT. He is felt to be a poor chemotherapy candidate due to his multiple medical problems. He was discharged back to skilled nursing facility. He has been in a skilled nursing facility for around a year after his amputation. He feels much stronger since his discharge from the hospital. He does have a history of multiple admissions for diastolic heart failure, but he has not had any of these issues since last summer. He also has CKD-IV with creatinine around 3, COPD GOLD 3, DM, PAH, and is s/p left AKA. he has h/o DVT.   He was readmitted 5/20-5/24 with another episode of anemia wtih Hgb 4.0. He received transfusion of at least 4 units. Since I saw him last, he had EUS which did not show extensive tumor, but he did have metaplasia higher up in stomach. He also had stress test that was low risk for ischemic event.   Pt returns for discussion of gastric cancer. He is high risk for surgery. He received palliative radiation but has not had response. He continues to drift down. He was  admitted to [facility] last week with hgb 4.7. He was transfused and when he left his Hgb was 8.3. His HGB this AM was 5.6. He remains in a facility. He feels tired, but relatively well today. he denies pain, nausea/vomiting, weight loss.    CT chest/abd/pelvis 11/04/2018 IMPRESSION: 1. Bilateral effusions right greater than left, associated with basilar consolidative changes and patchy airspace opacities as well as interstitial thickening. Could potentially represent a combination of heart failure in the background with an overlay of pneumonitis. Correlation with BNP may be helpful. 2. Enlarging lymph nodes in the chest may be indicative of metastatic disease though in the setting of potential heart failure and/or infection remaining nonspecific at this time. 3. Cardiac enlargement with signs of generalized atherosclerosis and coronary artery disease. 4. 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 5. Ascites further supporting the potential for cardiac dysfunction or volume overload.  repeat EGD 11/02/2018 pyrtle A medium-sized, infiltrative and ulcerated, circumferential mass with active oozing bleeding was found in the distal gastric antrum, in the prepyloric region of the stomach and at the pylorus. This mass is not obstructing. There was adherent clot which was cleared away with irrigation and lavage. The tumor is active bleeding from multiple sites none of which were amenable to endoscopic therapy. No endoscopic therapy available would provide long-lasting hemostasis given the malignant nature of the pathology. There tumor was ulcerated in the pyloric channel but the standard adult upper endoscope traversed this area into the duodenal bulb without difficulty.  Echo 09/24/2017 Study Conclusions  - Left ventricle: Abnormal global LV strain. The cavity size was normal. Wall thickness was increased in a pattern of moderate to severe LVH. Systolic function was normal.  The estimated ejection fraction was in the range of 50% to 55%. Wall motion was normal; there were no regional wall motion abnormalities. - Aortic root: The aortic root was mildly dilated. - Mitral valve: Mildly calcified annulus. There was mild regurgitation. - Left atrium: The atrium was moderately dilated.  Allergies Gabriel Cirri Seymour, CMA; 11/15/2018 1:54 PM) Penicillin G Potassium *PENICILLINS*  Allergies Reconciled   Medication History (Sabrina Canty, CMA; 11/15/2018 1:54 PM) Ondansetron HCl (4MG Tablet, Oral) Active. amLODIPine Besylate (5MG Tablet, Oral) Active. Aranesp (Albumin Free) (60MCG/0.3ML Soln Pref Syr, Injection) Active. Atorvastatin Calcium (40MG Tablet, Oral) Active. Carvedilol (12.5MG Tablet, Oral) Active. cloNIDine (0.2MG/24HR Patch Weekly, Transdermal) Active. cloNIDine (0.3MG/24HR Patch Weekly, Transdermal) Active. Doxazosin Mesylate (8MG Tablet, Oral) Active. hydrALAZINE HCl (100MG Tablet, Oral) Active. Isosorbide Mononitrate ER (120MG Tablet ER 24HR, Oral) Active. Levothyroxine Sodium (100MCG Tablet, Oral) Active. [place] Magnair Refill Kit (25MCG/ML Solution, Inhalation) Active. NovoLOG (100UNIT/ML Solution, Subcutaneous) Active. Torsemide (20MG Tablet, Oral) Active. Potassium Chloride Crys ER (20MEQ Tablet ER, Oral) Active. Pantoprazole Sodium (40MG Tablet DR, Oral) Active. Oseltamivir Phosphate (75MG Capsule, Oral) Active. Nitroglycerin (0.4MG Tab Sublingual, Sublingual) Active. Coreg (12.5MG Tablet, Oral) Active. Darbepoetin Alfa (40MCG/0.4ML Soln Pref Syr, Injection) Active. Cardura (8MG Tablet, Oral) Active. Synthroid (100MCG Tablet, Oral) Active. Imdur (120MG Tablet ER 24HR, Oral) Active. Charlotte Crumb Neohaler (15.6MCG Capsule, Inhalation) Active. Incruse Ellipta (62.5MCG/INH Aero Pow Br Act, Inhalation) Active. Insulin Aspart FlexPen (100UNIT/ML Soln Pen-inj, Subcutaneous) Active. Medications Reconciled   Review of  Systems Stark Klein MD; 11/15/2018 3:37 PM) All other systems negative  Vitals (Sabrina Canty CMA; 11/15/2018 1:56 PM) 11/15/2018 1:55 PM Weight: 138 lb Height: 68in Weight was reported by patient. Body Surface Area: 1.75 m Body Mass Index: 20.98 kg/m  Temp.: 99.41F (Temporal)  Pulse: 98 (Regular)  BP: 198/110(Sitting, Left Arm, Standard)  Physical Exam Stark Klein MD; 11/15/2018 3:38 PM) General Mental Status-Alert. General Appearance-Consistent with stated age. Hydration-Well hydrated. Voice-Normal.  Head and Neck Head-normocephalic, atraumatic with no lesions or palpable masses.  Eye Sclera/Conjunctiva - Bilateral-No scleral icterus.  Chest and Lung Exam Chest and lung exam reveals -quiet, even and easy respiratory effort with no use of accessory muscles. Inspection Chest Wall - Normal. Back - normal. Note: midline scar chest c/w CABG   Breast - Did not examine.  Cardiovascular Cardiovascular examination reveals -normal pedal pulses bilaterally. Note: regular rate and rhythm  Abdomen Inspection-Inspection Normal. Palpation/Percussion Palpation and Percussion of the abdomen reveal - Soft, Non Tender, No Rebound tenderness, No Rigidity (guarding) and No hepatosplenomegaly.  Peripheral Vascular Upper Extremity Inspection - Bilateral - Normal - No Clubbing, No Cyanosis, No Edema, Pulses Intact. Lower Extremity Palpation - Edema - Bilateral - No edema - Bilateral.  Neurologic Neurologic evaluation reveals -alert and oriented x 3 with no impairment of recent or remote memory. Mental Status-Normal.  Musculoskeletal Normal Exam - Left-Upper Extremity Strength Normal and Lower Extremity Strength Normal. Normal Exam - Right-Upper Extremity Strength Normal and Lower Extremity Strength Normal. Note: left lower leg surgically absent.   Lymphatic Head & Neck  General Head & Neck Lymphatics: Bilateral - Description -  Normal. Axillary  General Axillary Region: Bilateral - Description - Normal. Tenderness - Non Tender.    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  We again reviewed risks of surgery. I filled out the ACS risk calculator which showed 55 % risk of serious complications and 79% risk of death. This did not include risks of dialysis and risk of increased ventilator use due to interstitial lung disease and pulmonary hypertension.  His risk of bleeding is higher due to thrombocytopenia and platelet dysfunction. I recommend palliatlve care but am willing to do surgery if patient and family understand risks. Also, I would want patient to be on a medical service. He and his family are going to consider.  40 min spent in evaluation, examination, counseling, and coordination of care. >50% spent in counseling.   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

## 2018-11-15 NOTE — ED Provider Notes (Signed)
[facility] DEPT Provider Note   CSN: [id number] Arrival date & time: 11/15/18  1602     History   Chief Complaint Chief Complaint  Patient presents with  . Abnormal Lab    HPI Leonard [name] is a 68 y.o. male w PMHx CAD, DM, COPD, CHF, gastric adenocarcinoma, CKD, presenting to the ED from [place] with complaint of low hgb. Pt states he saw [name] today with general surgery to discuss further plans for his bleeding gastric adenocarcinoma.  He was recently admitted on 11/01/2018 and discharged on 11/07/2022 acute on chronic anemia with a hemoglobin of 4.7.  He has admission he had an EGD done by GI on 11/02/2018 which confirmed the bleeding gastric tumor though it does not appear any hemostasis was achieved on the procedure.  He received multiple units of blood and was discharged with a stable hemoglobin at 8.3.  Plan was for outpatient follow-up with general surgery for possible palliative radiation versus palliative gastrectomy.  He states he is asymptomatic today.  He is unable to tell me any plans per his visit today with [name], and there is no documentation in the chart for his visit.  He was sent here with a low hemoglobin today of 5.6.  He does endorse persistent melena though no lightheadedness or weakness.  No shortness of breath.  No abdominal pain.  No vomiting.  Not on anticoagulation.     The history is provided by the patient and medical records.    Past Medical History:  Diagnosis Date  . Acute on chronic diastolic heart failure ([place]) 07/04/2014  . Anemia of renal disease   . Atherosclerotic peripheral vascular disease with gangrene ([place]) 02/14/2017  . CAD (coronary artery disease) 04/06/2013  . Cancer ([place])    skin cancer-  . CHF (congestive heart failure) ([place])   . Chronic anemia 02/28/2016  . Chronic deep vein thrombosis (DVT) of femoral vein of left lower extremity ([place]) 09/03/2016  . Chronic respiratory failure with  hypoxia ([place]) 08/25/2017  . CKD (chronic kidney disease) stage 4, GFR 15-29 ml/min (HCC) 01/15/2015   Follows with [facility].  Last visits:  07/23/16: No change in meds. Worried that if we increase diuretics could lead to another AKI  . CKD (chronic kidney disease), stage IV ([place])    07/07/2017  . Claudication ([place])   . Collapse of left talus 01/14/2017  . COPD GOLD III  02/16/2014   Quit smoking 2014  - PFT's  04/06/13   FEV1 1.28 (44 % ) ratio 48  p no % improvement from saba p ?  prior to study with DLCO  43/54 % corrects to 97  % for alv volume - 01/31/2016  After extensive coaching HFA effectiveness =    75% from baseline 0 > changed to sprivia respimat   - PFT's  03/17/2016  FEV1 2.25  (81 % ) ratio 88  p 2 % improvement from saba p spiriva respimat x 2  prior to study with  . Coronary artery disease   . Diabetes ([place])   . Diabetic polyneuropathy associated with type 2 diabetes mellitus ([place]) 01/14/2017  . Diabetic retinopathy ([place]) 11/13/2015   Per Dr. Erenest Rasher eye exam in 10/17/15, mild background diabetic retinopathy  . Fatigue associated with anemia 01/21/2016  . Gangrene of toe ([place])    right 5th/notes 01/04/2013   . HCAP (healthcare-associated pneumonia)   . Hematuria 06/27/2015  . High cholesterol   . History of  tobacco use 08/23/2013   Smoked pack per day for 46 years Quit smoking - 02/03/2013   . Hypertension   . Hypothyroidism 11/22/2014  . Idiopathic chronic gout of left foot without tophus   . Iron deficiency anemia 08/03/2015  . Left foot pain 10/29/2016  . Long term (current) use of anticoagulants [Z79.01] 09/09/2016  . MGUS (monoclonal gammopathy of unknown significance) 01/15/2015  . Mixed hyperlipidemia 07/17/2008  . Morbid obesity due to excess calories ([place]) 03/17/2016  . PAD (peripheral artery disease) ([place])   . Pleural effusion   . Pneumonitis 07/26/2017  . Pulmonary artery hypertension ([place]) 07/25/2017  . PVD - hx of Rt SFA PTA and s/p Rt 4-5th toe amp Dec 2014 04/05/2013   . 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 Respiratory failure ([place]) 08/13/2017  . Respiratory failure with hypoxia ([place]) 09/23/2017  . 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 Secondary hypertension   . Shortness of breath dyspnea    with exertion  . Skin lesion of face 02/22/2015  . SOB (shortness of breath)   . Type 2 diabetes mellitus with pressure callus ([place]) 12/29/2016  . Type II diabetes mellitus (HCC)    Type II  . Type II diabetes mellitus with complication [facility])     Patient Active Problem List   Diagnosis Date Noted  . Gastric adenocarcinoma ([place]) 09/18/2018  . Symptomatic anemia 07/20/2018  . Hematemesis/vomiting blood 07/15/2018  . Acute blood loss anemia 07/15/2018  . Anemia due to chronic blood loss   . Upper GI bleed 06/23/2018  . Acute renal failure superimposed on chronic kidney disease ([place])   . Abdominal pain   . DNR (do not resuscitate)   . 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 DNR (do not resuscitate) discussion   . Anemia 03/29/2018  . Acute GI bleeding 03/28/2018  . Dysphagia 10/01/2017  . Palliative care by specialist   . Severe comorbid illness   . Moderate malnutrition ([place]) 08/27/2017  . Hypoxemia   . Atelectasis   . Chronic respiratory failure with hypoxia ([place]) 08/25/2017  . Pulmonary artery hypertension ([place]) 07/25/2017  . Pressure injury of skin 07/20/2017  . Goals of care, counseling/discussion 02/11/2017  . Diabetic polyneuropathy associated with type 2 diabetes mellitus ([place]) 01/14/2017  . Pleural effusion   . Chronic anemia 02/28/2016  . Weakness generalized 01/21/2016  . Diabetic retinopathy ([place]) 11/13/2015  . Iron deficiency anemia 08/03/2015  . Type II diabetes mellitus with complication ([place])   . Chronic kidney disease (CKD), stage III (moderate)   . MGUS (monoclonal gammopathy of unknown significance) 01/15/2015  . Deficiency anemia 01/15/2015  . CKD (chronic kidney disease) stage 4, GFR 15-29 ml/min (HCC) 01/15/2015  . Hypothyroidism  11/22/2014  . COPD GOLD III  02/16/2014  . Anemia of renal disease   . Chronic diastolic CHF (congestive heart failure) ([place])   . History of tobacco use 08/23/2013  . 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 CAD (coronary artery disease) 04/06/2013  . PVD - hx of Rt SFA PTA and s/p Rt 4-5th toe amp Dec 2014 04/05/2013  . 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 Mixed hyperlipidemia 07/17/2008    Past Surgical History:  Procedure Laterality Date  . ABDOMINAL AORTOGRAM W/LOWER EXTREMITY N/A 03/12/2017   Procedure: ABDOMINAL AORTOGRAM W/LOWER EXTREMITY;  Surgeon: Conrad [place]ville, MD;  Location: [facility];  Service: Cardiovascular;  Laterality: N/A;  . AMPUTATION Right 01/28/2013   Procedure: RAY AMPUTATION RIGHT  4th & 5th TOE;  Surgeon: Rosetta Posner, MD;  Location: [facility];  Service: Vascular;  Laterality: Right;  . AMPUTATION Left 03/15/2017   Procedure: AMPUTATION ABOVE KNEE;  Surgeon: Waynetta Sandy, MD;  Location: [facility];  Service: Vascular;  Laterality: Left;  . ANGIOPLASTY / STENTING FEMORAL Right 01/13/2013   superficial femoral artery x 2 (3 mm x 60 mm, 4 mm x 60 mm)  . AV FISTULA PLACEMENT Left 02/11/2016   Procedure: LEFT UPPER ARM BRACHIAL CEPHALIC ARTERIOVENOUS (AV) FISTULA CREATION;  Surgeon: Conrad Mansfield, MD;  Location: [facility];  Service: Vascular;  Laterality: Left;  . BIOPSY  03/29/2018   Procedure: BIOPSY;  Surgeon: Rush Landmark Telford Nab., MD;  Location: [facility];  Service: Gastroenterology;;  . BIOPSY  05/31/2018   Procedure: BIOPSY;  Surgeon: Irving Copas., MD;  Location: [facility];  Service: Gastroenterology;;  . COLONOSCOPY W/ POLYPECTOMY    . CORONARY ARTERY BYPASS GRAFT N/A 04/07/2013   Procedure: CORONARY ARTERY BYPASS GRAFTING (CABG);  Surgeon: Ivin Poot, MD;  Location: [facility];  Service: Open Heart Surgery;  Laterality: N/A;  . ESOPHAGOGASTRODUODENOSCOPY (EGD) WITH PROPOFOL N/A 03/29/2018   Procedure: ESOPHAGOGASTRODUODENOSCOPY (EGD) WITH PROPOFOL;   Surgeon: Rush Landmark Telford Nab., MD;  Location: [facility];  Service: Gastroenterology;  Laterality: N/A;  . ESOPHAGOGASTRODUODENOSCOPY (EGD) WITH PROPOFOL N/A 05/31/2018   Procedure: ESOPHAGOGASTRODUODENOSCOPY (EGD) WITH PROPOFOL;  Surgeon: Rush Landmark Telford Nab., MD;  Location: [facility];  Service: Gastroenterology;  Laterality: N/A;  . ESOPHAGOGASTRODUODENOSCOPY (EGD) WITH PROPOFOL N/A 11/02/2018   Procedure: ESOPHAGOGASTRODUODENOSCOPY (EGD) WITH PROPOFOL;  Surgeon: Jerene Bears, MD;  Location: [facility];  Service: Gastroenterology;  Laterality: N/A;  . EUS N/A 05/31/2018   Procedure: UPPER ENDOSCOPIC ULTRASOUND (EUS) RADIAL;  Surgeon: Rush Landmark Telford Nab., MD;  Location: [facility];  Service: Gastroenterology;  Laterality: N/A;  . INTRAOPERATIVE TRANSESOPHAGEAL ECHOCARDIOGRAM N/A 04/07/2013   Procedure: INTRAOPERATIVE TRANSESOPHAGEAL ECHOCARDIOGRAM;  Surgeon: Ivin Poot, MD;  Location: [facility];  Service: Open Heart Surgery;  Laterality: N/A;  . IR FLUORO GUIDE CV LINE RIGHT  11/04/2017  . IR REMOVAL TUN CV CATH W/O FL  11/27/2017  . IR US GUIDE VASC ACCESS RIGHT  11/04/2017  . LEFT HEART CATHETERIZATION WITH CORONARY ANGIOGRAM N/A 04/05/2013   Procedure: LEFT HEART CATHETERIZATION WITH CORONARY ANGIOGRAM;  Surgeon: Peter M Martinique, MD;  Location: [facility] CATH LAB;  Service: Cardiovascular;  Laterality: N/A;  . LOWER EXTREMITY ANGIOGRAM Bilateral 01/06/2013   Procedure: LOWER EXTREMITY ANGIOGRAM;  Surgeon: Conrad [place], MD;  Location: [facility] CATH LAB;  Service: Cardiovascular;  Laterality: Bilateral;  . LOWER EXTREMITY ANGIOGRAM Right 01/13/2013   Procedure: LOWER EXTREMITY ANGIOGRAM;  Surgeon: Conrad Denton, MD;  Location: [facility] CATH LAB;  Service: Cardiovascular;  Laterality: Right;  . SKIN CANCER EXCISION  2017  . THROAT SURGERY  1983   polyps  . TONSILLECTOMY AND ADENOIDECTOMY  ~ 1965        Home Medications    Prior to Admission medications   Medication Sig Start Date End Date  Taking? Authorizing Provider  acetaminophen (TYLENOL) 325 MG tablet Take 650 mg by mouth every 6 (six) hours as needed for fever.   Yes [provider]  amLODipine (NORVASC) 5 MG tablet Take 1 tablet (5 mg total) by mouth daily. 07/12/18  Yes Tammi Klippel, Sherin, DO  ascorbic acid (VITAMIN C) 500 MG tablet Take 500 mg by mouth daily.   Yes [provider]  atorvastatin (LIPITOR) 40 MG tablet TAKE ONE (1) TABLET BY MOUTH EVERY DAY  AT 6:00PM Patient taking differently: Take 40 mg by mouth daily at 6 PM.  11/17/16  Yes Larey Dresser, MD  carvedilol (COREG) 12.5 MG tablet Take 1 tablet (12.5 mg total) by mouth 2 (two) times daily with a meal. 08/30/17  Yes Meccariello, Bernita Raisin, DO  cholecalciferol (VITAMIN D) 25 MCG (1000 UT) tablet Take 2,000 Units by mouth 3 (three) times daily.   Yes [provider]  cloNIDine (CATAPRES - DOSED IN MG/24 HR) 0.2 mg/24hr patch Place 1 patch (0.2 mg total) onto the skin once a week. 08/09/17  Yes Steve Rattler, DO  Darbepoetin Alfa (ARANESP) 40 MCG/0.4ML SOSY injection Inject 0.4 mLs (40 mcg total) into the skin every 30 (thirty) days. 09/28/17  Yes Meccariello, Bernita Raisin, DO  doxazosin (CARDURA) 8 MG tablet Take 1 tablet (8 mg total) by mouth every evening. Patient taking differently: Take 8 mg by mouth daily.  12/01/16  Yes Hensel, Jamal Collin, MD  feeding supplement, ENSURE ENLIVE, (ENSURE ENLIVE) LIQD Take 237 mLs by mouth 3 (three) times daily between meals. 08/07/17  Yes Riccio, Angela C, DO  ferrous sulfate (FEROSUL) 325 (65 FE) MG tablet Take 1 tablet (325 mg total) by mouth daily at 12 noon. Patient taking differently: Take 325 mg by mouth daily.  08/31/17  Yes Meccariello, Bernita Raisin, DO  folic acid (FOLVITE) 1 MG tablet Take 1 tablet (1 mg total) by mouth daily. 11/06/18 11/06/19 Yes Mercy Riding, MD  Glycopyrrolate (LONHALA MAGNAIR STARTER KIT) 25 MCG/ML SOLN Inhale 1 mL into the lungs 2 (two) times a day.   Yes [provider]   hydrALAZINE (APRESOLINE) 100 MG tablet TAKE ONE TABLET BY MOUTH EVERY EIGHT HOURS Patient taking differently: Take 100 mg by mouth every 8 (eight) hours.  11/14/16  Yes [name], MD  insulin aspart (NOVOLOG) 100 UNIT/ML injection Inject 0-10 Units into the skin 3 (three) times daily before meals. <150= 0 units, 151-200= 2 units, 201-250= 4 units, 251-300= 6 units, 301-350= 8 units, 351-400= 10 units   Yes [provider]  isosorbide mononitrate (IMDUR) 120 MG 24 hr tablet Take 1 tablet (120 mg total) by mouth daily. 10/02/17  Yes Anderson, Chelsey L, DO  levothyroxine (SYNTHROID, LEVOTHROID) 100 MCG tablet Take 100 mcg by mouth daily before breakfast.    Yes [provider]  nitroGLYCERIN (NITROSTAT) 0.4 MG SL tablet Place 1 tablet (0.4 mg total) under the tongue every 5 (five) minutes as needed for chest pain. Patient taking differently: Place 0.4 mg under the tongue every 5 (five) minutes as needed for chest pain. Chest pain 11/14/16  Yes [name], MD  ondansetron (ZOFRAN) 4 MG tablet Take 4 mg by mouth every 8 (eight) hours as needed for nausea or vomiting.   Yes [provider]  pantoprazole (PROTONIX) 40 MG tablet Take 1 tablet (40 mg total) by mouth 2 (two) times daily. 04/03/18  Yes Beard, Aldona Bar N, DO  polyethylene glycol (MIRALAX / GLYCOLAX) packet Take 17 g by mouth daily as needed for mild constipation or moderate constipation. 08/07/17  Yes Riccio, Angela C, DO  sucralfate (CARAFATE) 1 g tablet Take 1 tablet (1 g total) by mouth 4 (four) times daily -  with meals and at bedtime. 11/06/18  Yes Mercy Riding, MD  torsemide (DEMADEX) 20 MG tablet Take 2 tablets (40 mg total) by mouth daily. 02/16/18  Yes Larey Dresser, MD  umeclidinium bromide (INCRUSE ELLIPTA) 62.5 MCG/INH AEPB Inhale 1 puff  into the lungs daily. 06/28/18  Yes Mullis, Kiersten P, DO  Blood Glucose Monitoring Suppl (ONETOUCH VERIO FLEX SYSTEM) w/Device KIT 1 kit by Does not  apply route daily. 10/10/16   McDiarmid, Blane Ohara, MD  glucose blood (ONETOUCH VERIO) test strip Use as instructed to test three times daily. ICD-10 code: E11.22 10/15/16   McDiarmid, Blane Ohara, MD  [facility] DELICA LANCETS 62B MISC 1 each by Does not apply route 3 (three) times daily. ICD-10 code: E11.22 Patient not taking: Reported on 07/15/2018 10/15/16   McDiarmid, Blane Ohara, MD    Family History Family History  Problem Relation Age of Onset  . Cancer Mother        lung cancer  . Hypertension Mother   . Cancer Sister        patient thinks it was uterine cancer  . Hypertension Sister   . Heart attack Sister   . Stroke Neg Hx   . Colon cancer Neg Hx   . Esophageal cancer Neg Hx   . Inflammatory bowel disease Neg Hx   . Pancreatic cancer Neg Hx   . Liver disease Neg Hx   . Rectal cancer Neg Hx   . Stomach cancer Neg Hx     Social History Social History   Tobacco Use  . Smoking status: Current Every Day Smoker    Packs/day: 1.00    Years: 46.00    Pack years: 46.00    Types: Cigarettes    Start date: 02/04/1967  . Smokeless tobacco: Never Used  Substance Use Topics  . Alcohol use: No    Alcohol/week: 0.0 standard drinks  . Drug use: No     Allergies   Penicillins   Review of Systems Review of Systems  All other systems reviewed and are negative.    Physical Exam Updated Vital Signs BP (!) 186/68   Pulse 79   Temp 99.3 F (37.4 C) (Oral)   Resp 16   SpO2 93%   Physical Exam Vitals signs and nursing note reviewed.  Constitutional:      Appearance: He is well-developed.     Comments: Chronically ill-appearing.  Left AKA.  HENT:     Head: Normocephalic and atraumatic.  Eyes:     Comments: Conjunctive appears pale.  Cardiovascular:     Rate and Rhythm: Normal rate and regular rhythm.  Pulmonary:     Effort: Pulmonary effort is normal. No respiratory distress.     Breath sounds: Normal breath sounds.  Abdominal:     General: Bowel sounds are normal.      Palpations: Abdomen is soft.     Tenderness: There is no abdominal tenderness. There is no guarding or rebound.  Musculoskeletal:     Comments: Right lower extremity with edema.  Skin:    General: Skin is warm.  Neurological:     Mental Status: He is alert.     Comments: Alert and mentating appropriately.  Psychiatric:        Behavior: Behavior normal.      ED Treatments / Results  Labs (all labs ordered are listed, but only abnormal results are displayed) Labs Reviewed  COMPREHENSIVE METABOLIC PANEL - Abnormal; Notable for the following components:      Result Value   Potassium 3.3 (*)    Chloride 114 (*)    CO2 20 (*)    Glucose, Bld 204 (*)    BUN 49 (*)    Creatinine, Ser 2.56 (*)    Calcium 7.8 (*)  Total Protein 5.6 (*)    Albumin 2.9 (*)    GFR calc non Af Amer 25 (*)    GFR calc Af Amer 29 (*)    All other components within normal limits  CBC - Abnormal; Notable for the following components:   WBC 3.5 (*)    RBC 1.97 (*)    Hemoglobin 5.8 (*)    HCT 18.8 (*)    Platelets 110 (*)    All other components within normal limits  SARS CORONAVIRUS 2 BY RT PCR (HOSPITAL ORDER, [place] [facility])  TYPE AND SCREEN  PREPARE RBC (CROSSMATCH)    EKG None  Radiology No results found.  Procedures .Critical Care Performed by: Brelan Hannen, [place] N, PA-C Authorized by: Britain Saber, [place] N, PA-C   Critical care provider statement:    Critical care time (minutes):  45   Critical care time was exclusive of:  Separately billable procedures and treating other patients and teaching time   Critical care was necessary to treat or prevent imminent or life-threatening deterioration of the following conditions:  Circulatory failure   Critical care was time spent personally by me on the following activities:  Discussions with consultants, evaluation of patient's response to treatment, examination of patient, ordering and performing treatments and  interventions, ordering and review of laboratory studies, ordering and review of radiographic studies, pulse oximetry, re-evaluation of patient's condition, obtaining history from patient or surrogate and review of old charts   I assumed direction of critical care for this patient from another provider in my specialty: no     (including critical care time)  Medications Ordered in ED Medications  0.9 %  sodium chloride infusion (Manually program via Guardrails IV Fluids) (has no administration in time range)     Initial Impression / Assessment and Plan / ED Course  I have reviewed the triage vital signs and the nursing notes.  Pertinent labs & imaging results that were available during my care of the patient were reviewed by me and considered in my medical decision making (see chart for details).  Clinical Course as of Nov 15 2038  Mon Nov 15, 2018  1745 Consulted with Dr. Harlow Asa with gen surgery. He will review documentation from Dr. Marlowe Aschoff visit today and will evaluated pt in the ED. Agrees with medicine admission, requests GI consult as well. Appreciate consult   [JR]  2038 Spoke with Dr. Loletha Carrow with [facility], reports they will consult on the patient in the morning.    [JR]    Clinical Course User Index [JR] Ekta Dancer, [place] N, PA-C       Patient with known history of gastric adenocarcinoma, presenting to the ED with low hemoglobin.  He was just discharged from an admission on 11/07/2018 for anemia.  He had a consult today outpatient with [name] for further management regarding possible palliative radiation therapy versus palliative gastrectomy.  He is unable to tell me the plan as of his visit today though he had noted low hemoglobin that resulted after his visit and therefore presents back to the ED.  He endorses melena though no other associated symptoms.  His hemoglobin today is 5.8, hematocrit is 18.8.   Metabolic panel reveals no significant change from recent 8 days ago. 2  Unit blood ordered for transfusion. Pt will require admission.  Consult placed to general surgery, Dr. Harlow Asa agrees with medical admission. Will consult on patient.  Dr. Flossie Buffy accepting admission.  Final Clinical Impressions(s) / ED Diagnoses  Final diagnoses:  Anemia due to acute blood loss  Gastric adenocarcinoma [facility])    ED Discharge Orders    None       Manasa Spease, [place] N, PA-C 11/15/18 2040    Nat Christen, MD 11/16/18 [phone]

## 2018-11-16 DIAGNOSIS — D649 Anemia, unspecified: Secondary | ICD-10-CM

## 2018-11-16 DIAGNOSIS — K922 Gastrointestinal hemorrhage, unspecified: Secondary | ICD-10-CM | POA: Diagnosis present

## 2018-11-16 DIAGNOSIS — C169 Malignant neoplasm of stomach, unspecified: Secondary | ICD-10-CM

## 2018-11-16 LAB — GLUCOSE, CAPILLARY: Glucose-Capillary: 197 mg/dL — ABNORMAL HIGH (ref 70–99)

## 2018-11-16 LAB — MRSA PCR SCREENING: MRSA by PCR: NEGATIVE

## 2018-11-16 LAB — BASIC METABOLIC PANEL
Anion gap: 7 (ref 5–15)
BUN: 49 mg/dL — ABNORMAL HIGH (ref 8–23)
CO2: 20 mmol/L — ABNORMAL LOW (ref 22–32)
Calcium: 7.7 mg/dL — ABNORMAL LOW (ref 8.9–10.3)
Chloride: 116 mmol/L — ABNORMAL HIGH (ref 98–111)
Creatinine, Ser: 2.52 mg/dL — ABNORMAL HIGH (ref 0.61–1.24)
GFR calc Af Amer: 29 mL/min — ABNORMAL LOW (ref >60)
GFR calc non Af Amer: 25 mL/min — ABNORMAL LOW (ref >60)
Glucose, Bld: 116 mg/dL — ABNORMAL HIGH (ref 70–99)
Potassium: 3.3 mmol/L — ABNORMAL LOW (ref 3.5–5.1)
Sodium: 143 mmol/L (ref 135–145)

## 2018-11-16 LAB — CBG MONITORING, ED
Glucose-Capillary: 116 mg/dL — ABNORMAL HIGH (ref 70–99)
Glucose-Capillary: 103 mg/dL — ABNORMAL HIGH (ref 70–99)
Glucose-Capillary: 125 mg/dL — ABNORMAL HIGH (ref 70–99)
Glucose-Capillary: 147 mg/dL — ABNORMAL HIGH (ref 70–99)
Glucose-Capillary: 130 mg/dL — ABNORMAL HIGH (ref 70–99)

## 2018-11-16 LAB — CBC
HCT: 22.2 % — ABNORMAL LOW (ref 39.0–52.0)
Hemoglobin: 7 g/dL — ABNORMAL LOW (ref 13.0–17.0)
MCH: 29.5 pg (ref 26.0–34.0)
MCHC: 31.5 g/dL (ref 30.0–36.0)
MCV: 93.7 fL (ref 80.0–100.0)
Platelets: 82 10*3/uL — ABNORMAL LOW (ref 150–400)
RBC: 2.37 MIL/uL — ABNORMAL LOW (ref 4.22–5.81)
RDW: 15.4 % (ref 11.5–15.5)
WBC: 3.8 10*3/uL — ABNORMAL LOW (ref 4.0–10.5)
nRBC: 0 % (ref 0.0–0.2)

## 2018-11-16 LAB — PREPARE RBC (CROSSMATCH)

## 2018-11-16 MED ORDER — TORSEMIDE 20 MG PO TABS
40.0000 mg | ORAL_TABLET | Freq: Every day | ORAL | Status: DC
  Administered 2018-11-17 – 2018-11-23 (×7): 40 mg via ORAL
  Filled 2018-11-16 (×7): qty 2

## 2018-11-16 MED ORDER — SODIUM CHLORIDE 0.9% IV SOLUTION
Freq: Once | INTRAVENOUS | Status: AC
  Administered 2018-11-16: 22:00:00 via INTRAVENOUS

## 2018-11-16 MED ORDER — UMECLIDINIUM BROMIDE 62.5 MCG/INH IN AEPB
1.0000 | INHALATION_SPRAY | Freq: Every day | RESPIRATORY_TRACT | Status: DC
  Administered 2018-11-16 – 2018-11-24 (×7): 1 via RESPIRATORY_TRACT
  Filled 2018-11-16 (×3): qty 7

## 2018-11-16 MED ORDER — POTASSIUM CHLORIDE CRYS ER 20 MEQ PO TBCR
40.0000 meq | EXTENDED_RELEASE_TABLET | Freq: Once | ORAL | Status: AC
  Administered 2018-11-16: 40 meq via ORAL
  Filled 2018-11-16: qty 2

## 2018-11-16 MED ORDER — ORAL CARE MOUTH RINSE
15.0000 mL | Freq: Two times a day (BID) | OROMUCOSAL | Status: DC
  Administered 2018-11-17 – 2018-11-24 (×10): 15 mL via OROMUCOSAL

## 2018-11-16 NOTE — ED Notes (Signed)
ED TO INPATIENT HANDOFF REPORT  Name/Age/Gender Leonard [name] 68 y.o. male  Code Status    Code Status Orders  (From admission, onward)         Start     Ordered   11/15/18 1927  Full code  Continuous     11/15/18 1929        Code Status History    Date Active Date Inactive Code Status Order ID Comments User Context   11/01/2018 1853 11/07/2018 1725 Full Code [id number]  Jani Gravel, MD ED   07/21/2018 0828 07/23/2018 1747 Full Code [id number]  Mliss Sax, RN Inpatient   07/20/2018 2125 07/21/2018 0828 Full Code [id number]  Elwyn Reach, MD Inpatient   07/15/2018 1754 07/16/2018 2202 Full Code [id number]  Darliss Cheney, MD Inpatient   07/06/2018 1727 07/11/2018 1833 Full Code [id number]  Matilde Haymaker, MD ED   06/24/2018 0048 06/27/2018 2122 Full Code [id number]  Benay Pike, MD Inpatient   04/01/2018 1053 04/03/2018 1739 Full Code [id number]  Dellinger, Bobby Rumpf, PA-C Inpatient   04/01/2018 1049 04/01/2018 1053 Full Code [id number]  Patriciaann Clan, DO Inpatient   03/31/2018 1729 04/01/2018 1049 DNR [id number]  Fuller Canada, PA-C Inpatient   03/29/2018 0106 03/31/2018 1729 Full Code [id number]  Rise Patience, MD Inpatient   09/23/2017 1622 10/01/2017 2344 Full Code [id number]  Bufford Lope, DO Inpatient   08/25/2017 1454 08/30/2017 1930 Full Code [id number]  Sherene Sires, DO Inpatient   08/14/2017 1011 08/14/2017 2318 Partial Code [id number]  Sherene Sires, DO Inpatient   08/13/2017 2207 08/14/2017 1011 Full Code [id number]  Matilde Haymaker, MD Inpatient   08/13/2017 1546 08/13/2017 2207 DNR [id number]  Daisy Floro, DO Inpatient   08/13/2017 1434 08/13/2017 1545 DNR [id number]  Daisy Floro, DO ED   07/19/2017 2342 08/07/2017 1847 Partial Code [id number]  Omar Person, NP Inpatient   07/05/2017 1253 07/19/2017 2342 Full Code [id number]  Bufford Lope, DO ED   03/12/2017 2004 03/18/2017 1814 Full Code [id number]  Conrad Wellington, MD Inpatient   12/01/2016 2050 12/04/2016 1213 Full Code  [id number]  Mercy Riding, MD ED   03/22/2016 0119 03/24/2016 1639 Full Code [id number]  Rogue Bussing, MD Inpatient   01/22/2016 0021 01/24/2016 1657 Full Code [id number]  Steve Rattler, DO ED   01/14/2016 0330 01/18/2016 1456 Full Code [id number]  Verner Mould, MD Inpatient   08/27/2015 1501 09/02/2015 1730 Full Code [id number]  Bufford Lope, DO Inpatient   08/07/2015 0136 08/09/2015 2136 Full Code [id number]  Lorna Few, DO ED   06/28/2015 0717 07/02/2015 1750 Full Code [id number]  Sela Hua, MD Inpatient   06/18/2015 1616 06/22/2015 2218 Full Code [id number]  Aquilla Hacker, MD Inpatient   06/03/2015 0531 06/07/2015 2048 Full Code [id number]  Mercy Riding, MD ED   02/10/2014 1452 02/13/2014 2053 Full Code [id number]  Leone Brand, MD Inpatient   12/09/2013 0128 12/12/2013 2150 Full Code [id number]  Coral Spikes, DO ED   04/07/2013 1243 04/20/2013 1701 Full Code [id number]  John Giovanni, PA-C Inpatient   04/05/2013 1537 04/07/2013 1243 Full Code [id number]  Martinique, Peter M, MD Inpatient   04/04/2013 0935 04/05/2013 1537 Full Code [id number]  Coral Spikes, DO Inpatient   01/25/2013 1617 02/01/2013 2234 Full Code [id number]  Rosemarie Ax, MD Inpatient   01/13/2013 1317 01/14/2013 1426 Full Code [id number]  Conrad  Hills, MD Inpatient   01/04/2013 2227  01/07/2013 2005 Full Code [id number]  Angelica Ran, MD Inpatient   Advance Care Planning Activity      Home/SNF/Other Home  Chief Complaint low lab issues  Level of Care/Admitting Diagnosis ED Disposition    ED Disposition Condition [facility] Area: [facility] [[id number]]  Level of Care: Telemetry [5]  Admit to tele based on following criteria: Complex arrhythmia (Bradycardia/Tachycardia)  Covid Evaluation: N/A  Diagnosis: GI bleed [354656]  Admitting Physician: Orene Desanctis [[id number]]  Attending Physician: RAI, RIPUDEEP K [[id number]]  Estimated length of stay: past midnight tomorrow   Certification:: I certify this patient will need inpatient services for at least 2 midnights  PT Class (Do Not Modify): Inpatient [101]  PT Acc Code (Do Not Modify): Private [1]       Medical History Past Medical History:  Diagnosis Date  . Acute on chronic diastolic heart failure ([place]) 07/04/2014  . Anemia of renal disease   . Atherosclerotic peripheral vascular disease with gangrene ([place]) 02/14/2017  . CAD (coronary artery disease) 04/06/2013  . Cancer ([place])    skin cancer-  . CHF (congestive heart failure) ([place])   . Chronic anemia 02/28/2016  . Chronic deep vein thrombosis (DVT) of femoral vein of left lower extremity ([place]) 09/03/2016  . Chronic respiratory failure with hypoxia ([place]) 08/25/2017  . CKD (chronic kidney disease) stage 4, GFR 15-29 ml/min (HCC) 01/15/2015   Follows with [facility].  Last visits:  07/23/16: No change in meds. Worried that if we increase diuretics could lead to another AKI  . CKD (chronic kidney disease), stage IV ([place])    07/07/2017  . Claudication ([place])   . Collapse of left talus 01/14/2017  . COPD GOLD III  02/16/2014   Quit smoking 2014  - PFT's  04/06/13   FEV1 1.28 (44 % ) ratio 48  p no % improvement from saba p ?  prior to study with DLCO  43/54 % corrects to 97  % for alv volume - 01/31/2016  After extensive coaching HFA effectiveness =    75% from baseline 0 > changed to sprivia respimat   - PFT's  03/17/2016  FEV1 2.25  (81 % ) ratio 88  p 2 % improvement from saba p spiriva respimat x 2  prior to study with  . Coronary artery disease   . Diabetes ([place])   . Diabetic polyneuropathy associated with type 2 diabetes mellitus ([place]) 01/14/2017  . Diabetic retinopathy ([place]) 11/13/2015   Per Dr. Erenest Rasher eye exam in 10/17/15, mild background diabetic retinopathy  . Fatigue associated with anemia 01/21/2016  . Gangrene of toe ([place])    right 5th/notes 01/04/2013   . HCAP (healthcare-associated pneumonia)   . Hematuria 06/27/2015  . High cholesterol   . History  of tobacco use 08/23/2013   Smoked pack per day for 46 years Quit smoking - 02/03/2013   . Hypertension   . Hypothyroidism 11/22/2014  . Idiopathic chronic gout of left foot without tophus   . Iron deficiency anemia 08/03/2015  . Left foot pain 10/29/2016  . Long term (current) use of anticoagulants [Z79.01] 09/09/2016  . MGUS (monoclonal gammopathy of unknown significance) 01/15/2015  . Mixed hyperlipidemia 07/17/2008  . Morbid obesity due to excess calories ([place]) 03/17/2016  . PAD (peripheral artery disease) ([place])   . Pleural effusion   . Pneumonitis 07/26/2017  . Pulmonary artery hypertension ([place]) 07/25/2017  . PVD - hx of Rt SFA PTA and s/p Rt  4-5th toe amp Dec 2014 04/05/2013  . 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 Respiratory failure ([place]) 08/13/2017  . Respiratory failure with hypoxia ([place]) 09/23/2017  . 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 Secondary hypertension   . Shortness of breath dyspnea    with exertion  . Skin lesion of face 02/22/2015  . SOB (shortness of breath)   . Type 2 diabetes mellitus with pressure callus ([place]) 12/29/2016  . Type II diabetes mellitus (HCC)    Type II  . Type II diabetes mellitus with complication (HCC)     Allergies Allergies  Allergen Reactions  . Penicillins Other (See Comments)    Has patient had a PCN reaction causing immediate rash, facial/tongue/throat swelling, SOB or lightheadedness with hypotension: Unknown Has patient had a PCN reaction causing severe rash involving mucus membranes or skin necrosis: Unknown Has patient had a PCN reaction that required hospitalization: Unknown Has patient had a PCN reaction occurring within the last 10 years: Unknown If all of the above answers are "NO", then may proceed with Cephalosporin use.     IV Location/Drains/Wounds Patient Lines/Drains/Airways Status   Active Line/Drains/Airways    Name:   Placement date:   Placement time:   Site:   Days:   Peripheral IV 11/15/18 Right Antecubital   11/15/18    1656     Antecubital   1   Peripheral IV 11/15/18 Right Forearm   11/15/18    1953    Forearm   1   Fistula / Graft Left Upper arm Arteriovenous vein graft   07/03/17    0000    Upper arm   501   Fistula / Graft Left Upper arm   -    -    Upper arm      External Urinary Catheter   11/02/18    0700    -   14          Labs/Imaging Results for orders placed or performed during the hospital encounter of 11/15/18 (from the past 48 hour(s))  Type and screen [place]     Status: None   Collection Time: 11/15/18  4:25 PM  Result Value Ref Range   ABO/RH(D) B NEG    Antibody Screen NEG    Sample Expiration 11/18/2018,2359    Unit Number [id number]    Blood Component Type RBC, LR IRR    Unit division 00    Status of Unit ISSUED,FINAL    Transfusion Status OK TO TRANSFUSE    Crossmatch Result Compatible    Unit Number [id number]    Blood Component Type RBC, LR IRR    Unit division 00    Status of Unit ISSUED,FINAL    Transfusion Status OK TO TRANSFUSE    Crossmatch Result      Compatible Performed at [facility], [place] [address], [place], [place] [address]   Comprehensive metabolic panel     Status: Abnormal   Collection Time: 11/15/18  4:27 PM  Result Value Ref Range   Sodium 141 135 - 145 mmol/L   Potassium 3.3 (L) 3.5 - 5.1 mmol/L   Chloride 114 (H) 98 - 111 mmol/L   CO2 20 (L) 22 - 32 mmol/L   Glucose, Bld 204 (H) 70 - 99 mg/dL   BUN 49 (H) 8 - 23 mg/dL   Creatinine, Ser 2.56 (H) 0.61 - 1.24 mg/dL   Calcium 7.8 (L) 8.9 - 10.3 mg/dL   Total Protein 5.6 (L)  6.5 - 8.1 g/dL   Albumin 2.9 (L) 3.5 - 5.0 g/dL   AST 17 15 - 41 U/L   ALT 13 0 - 44 U/L   Alkaline Phosphatase 96 38 - 126 U/L   Total Bilirubin 0.7 0.3 - 1.2 mg/dL   GFR calc non Af Amer 25 (L) >60 mL/min   GFR calc Af Amer 29 (L) >60 mL/min   Anion gap 7 5 - 15    Comment: Performed at [facility], [place] [address], [place], [place] [address]  CBC     Status:  Abnormal   Collection Time: 11/15/18  4:27 PM  Result Value Ref Range   WBC 3.5 (L) 4.0 - 10.5 K/uL   RBC 1.97 (L) 4.22 - 5.81 MIL/uL   Hemoglobin 5.8 (LL) 13.0 - 17.0 g/dL    Comment: This critical result has verified and been called to DOWD, PATTIE by Alonna Buckler on 10 12 2020 at 1703, and has been read back. CRITICAL CALLED AND VERIFIED   HCT 18.8 (L) 39.0 - 52.0 %   MCV 95.4 80.0 - 100.0 fL   MCH 29.4 26.0 - 34.0 pg   MCHC 30.9 30.0 - 36.0 g/dL   RDW 15.2 11.5 - 15.5 %   Platelets 110 (L) 150 - 400 K/uL    Comment: Immature Platelet Fraction may be clinically indicated, consider ordering this additional test [id number]    nRBC 0.0 0.0 - 0.2 %    Comment: Performed at [facility], [place] [address] [place], [place] [address]  Prepare RBC     Status: None   Collection Time: 11/15/18  5:47 PM  Result Value Ref Range   Order Confirmation      ORDER PROCESSED BY BLOOD BANK Performed at [facility], [place] [address], [place],  [address]   SARS Coronavirus 2 by RT PCR (hospital order, performed in [facility] hospital lab) Nasopharyngeal Nasopharyngeal Swab     Status: None   Collection Time: 11/15/18  5:58 PM   Specimen: Nasopharyngeal Swab  Result Value Ref Range   SARS Coronavirus 2 NEGATIVE NEGATIVE    Comment: (NOTE) If result is NEGATIVE SARS-CoV-2 target nucleic acids are NOT DETECTED. The SARS-CoV-2 RNA is generally detectable in upper and lower  respiratory specimens during the acute phase of infection. The lowest  concentration of SARS-CoV-2 viral copies this assay can detect is 250  copies / mL. A negative result does not preclude SARS-CoV-2 infection  and should not be used as the sole basis for treatment or other  patient management decisions.  A negative result may occur with  improper specimen collection / handling, submission of specimen other  than nasopharyngeal swab, presence of viral mutation(s) within the  areas  targeted by this assay, and inadequate number of viral copies  (<250 copies / mL). A negative result must be combined with clinical  observations, patient history, and epidemiological information. If result is POSITIVE SARS-CoV-2 target nucleic acids are DETECTED. The SARS-CoV-2 RNA is generally detectable in upper and lower  respiratory specimens dur ing the acute phase of infection.  Positive  results are indicative of active infection with SARS-CoV-2.  Clinical  correlation with patient history and other diagnostic information is  necessary to determine patient infection status.  Positive results do  not rule out bacterial infection or co-infection with other viruses. If result is PRESUMPTIVE POSTIVE SARS-CoV-2 nucleic acids MAY BE PRESENT.   A presumptive positive result was obtained  on the submitted specimen  and confirmed on repeat testing.  While 2019 novel coronavirus  (SARS-CoV-2) nucleic acids may be present in the submitted sample  additional confirmatory testing may be necessary for epidemiological  and / or clinical management purposes  to differentiate between  SARS-CoV-2 and other Sarbecovirus currently known to infect humans.  If clinically indicated additional testing with an alternate test  methodology [phone]) is advised. The SARS-CoV-2 RNA is generally  detectable in upper and lower respiratory sp ecimens during the acute  phase of infection. The expected result is Negative. Fact Sheet for Patients:  StrictlyIdeas.no Fact Sheet for Healthcare Providers: BankingDealers.co.za This test is not yet approved or cleared by the [place] FDA and has been authorized for detection and/or diagnosis of SARS-CoV-2 by FDA under an Emergency Use Authorization (EUA).  This EUA will remain in effect (meaning this test can be used) for the duration of the COVID-19 declaration under Section 564(b)(1) of the Act, 21 U.S.C. section  360bbb-3(b)(1), unless the authorization is terminated or revoked sooner. Performed at [facility], [place] [address], [place], [place] [address]   CBC     Status: Abnormal   Collection Time: 11/15/18  7:28 PM  Result Value Ref Range   WBC 3.4 (L) 4.0 - 10.5 K/uL   RBC 1.82 (L) 4.22 - 5.81 MIL/uL   Hemoglobin 5.4 (LL) 13.0 - 17.0 g/dL    Comment: REPEATED TO VERIFY THIS CRITICAL RESULT HAS VERIFIED AND BEEN CALLED TO WICKER,K. BY JESSICA PERRY ON 10 12 2020 AT 2002, AND HAS BEEN READ BACK. CRITICAL CALLED AND VERIFIED    HCT 17.4 (L) 39.0 - 52.0 %   MCV 95.6 80.0 - 100.0 fL   MCH 29.7 26.0 - 34.0 pg   MCHC 31.0 30.0 - 36.0 g/dL   RDW 15.3 11.5 - 15.5 %   Platelets 95 (L) 150 - 400 K/uL    Comment: PLATELET COUNT CONFIRMED BY SMEAR SPECIMEN CHECKED FOR CLOTS Immature Platelet Fraction may be clinically indicated, consider ordering this additional test [id number]    nRBC 0.0 0.0 - 0.2 %    Comment: Performed at [facility], [place] [address] [place], [place] [address]  CBG monitoring, ED     Status: Abnormal   Collection Time: 11/16/18  2:27 AM  Result Value Ref Range   Glucose-Capillary 116 (H) 70 - 99 mg/dL  Basic metabolic panel     Status: Abnormal   Collection Time: 11/16/18  4:48 AM  Result Value Ref Range   Sodium 143 135 - 145 mmol/L   Potassium 3.3 (L) 3.5 - 5.1 mmol/L   Chloride 116 (H) 98 - 111 mmol/L   CO2 20 (L) 22 - 32 mmol/L   Glucose, Bld 116 (H) 70 - 99 mg/dL   BUN 49 (H) 8 - 23 mg/dL   Creatinine, Ser 2.52 (H) 0.61 - 1.24 mg/dL   Calcium 7.7 (L) 8.9 - 10.3 mg/dL   GFR calc non Af Amer 25 (L) >60 mL/min   GFR calc Af Amer 29 (L) >60 mL/min   Anion gap 7 5 - 15    Comment: Performed at [facility], [place] [address] [place], [place] [address]  CBG monitoring, ED     Status: Abnormal   Collection Time: 11/16/18  8:17 AM  Result Value Ref Range   Glucose-Capillary 103 (H) 70 - 99 mg/dL  CBC     Status:  Abnormal   Collection Time: 11/16/18 10:39  AM  Result Value Ref Range   WBC 3.8 (L) 4.0 - 10.5 K/uL   RBC 2.37 (L) 4.22 - 5.81 MIL/uL   Hemoglobin 7.0 (L) 13.0 - 17.0 g/dL    Comment: REPEATED TO VERIFY POST TRANSFUSION SPECIMEN DELTA CHECK NOTED    HCT 22.2 (L) 39.0 - 52.0 %   MCV 93.7 80.0 - 100.0 fL   MCH 29.5 26.0 - 34.0 pg   MCHC 31.5 30.0 - 36.0 g/dL   RDW 15.4 11.5 - 15.5 %   Platelets 82 (L) 150 - 400 K/uL    Comment: Immature Platelet Fraction may be clinically indicated, consider ordering this additional test [id number] CONSISTENT WITH PREVIOUS RESULT    nRBC 0.0 0.0 - 0.2 %    Comment: Performed at [facility], [place] [address] [place], [place] [address]  CBG monitoring, ED     Status: Abnormal   Collection Time: 11/16/18 12:58 PM  Result Value Ref Range   Glucose-Capillary 125 (H) 70 - 99 mg/dL  CBG monitoring, ED     Status: Abnormal   Collection Time: 11/16/18  5:49 PM  Result Value Ref Range   Glucose-Capillary 147 (H) 70 - 99 mg/dL  CBG monitoring, ED     Status: Abnormal   Collection Time: 11/16/18  7:16 PM  Result Value Ref Range   Glucose-Capillary 130 (H) 70 - 99 mg/dL   *Note: Due to a large number of results and/or encounters for the requested time period, some results have not been displayed. A complete set of results can be found in Results Review.   No results found.  Pending Labs Unresulted Labs (From admission, onward)    Start     Ordered   11/17/18 0500  CBC  Daily,   R     11/16/18 1323   11/17/18 1017  Basic metabolic panel  Daily,   R     11/16/18 1323   11/16/18 1748  Type and screen  Once,   STAT     11/16/18 1747   11/16/18 1748  Prepare RBC  (Adult Blood Administration - Red Blood Cells)  Once,   R    Question Answer Comment  # of Units 1 unit   Transfusion Indications Emergent / Trauma   Transfusion Indications Symptomatic Anemia   If emergent release call blood bank Not emergent release   Instructions:  Transfuse      11/16/18 1747          Vitals/Pain Today's Vitals   11/16/18 1721 11/16/18 1723 11/16/18 1800 11/16/18 1830  BP: (!) 166/70 (!) 166/70 (!) 169/59 (!) 162/62  Pulse: 71 73 67 70  Resp: (!) 21 19 20 16   Temp:      TempSrc:      SpO2: 98% 95% 100% 100%  PainSc:        Isolation Precautions No active isolations  Medications Medications  0.9 %  sodium chloride infusion ( Intravenous Stopped 11/16/18 0609)  amLODipine (NORVASC) tablet 5 mg (5 mg Oral Given 11/16/18 1025)  atorvastatin (LIPITOR) tablet 40 mg (has no administration in time range)  carvedilol (COREG) tablet 12.5 mg (12.5 mg Oral Given 11/16/18 1750)  hydrALAZINE (APRESOLINE) tablet 100 mg (100 mg Oral Given 11/16/18 1547)  doxazosin (CARDURA) tablet 8 mg (8 mg Oral Given 11/16/18 1750)  isosorbide mononitrate (IMDUR) 24 hr tablet 120 mg (120 mg Oral Given 11/16/18 1025)  levothyroxine (SYNTHROID) tablet 100 mcg (100 mcg Oral Given 11/16/18 0605)  ferrous sulfate  tablet 325 mg (325 mg Oral Given 33/38/32 9191)  folic acid (FOLVITE) tablet 1 mg (1 mg Oral Given 11/16/18 1025)  insulin aspart (novoLOG) injection 0-9 Units (0 Units Subcutaneous Not Given 11/16/18 1750)  pantoprazole (PROTONIX) injection 40 mg (40 mg Intravenous Given 11/16/18 0221)  umeclidinium bromide (INCRUSE ELLIPTA) 62.5 MCG/INH 1 puff (1 puff Inhalation Given 11/16/18 0821)  torsemide (DEMADEX) tablet 40 mg (has no administration in time range)  0.9 %  sodium chloride infusion (Manually program via Guardrails IV Fluids) (has no administration in time range)  0.9 %  sodium chloride infusion (Manually program via Guardrails IV Fluids) ( Intravenous Stopped 11/16/18 0223)  potassium chloride 10 mEq in 100 mL IVPB (0 mEq Intravenous Stopped 11/16/18 0048)  potassium chloride SA (KLOR-CON) CR tablet 40 mEq (40 mEq Oral Given 11/16/18 1547)    Mobility non-ambulatory

## 2018-11-16 NOTE — Progress Notes (Addendum)
Subjective: CC: Patient sleeping on my arrival to his room. He will wake up for a few seconds to answer questions but then falls back to sleep. He denies any pain or nausea. He reports that his current wishes after discussion with [name] yesterday are that he would not like surgery. No family is present.   Objective: Vital signs in last 24 hours: Temp:  [98.2 F (36.8 C)-99.3 F (37.4 C)] 98.4 F (36.9 C) (10/13 0215) Pulse Rate:  [59-84] 69 (10/13 0800) Resp:  [14-23] 15 (10/13 0800) BP: (128-189)/(48-86) 166/64 (10/13 0800) SpO2:  [90 %-100 %] 92 % (10/13 0800)    Intake/Output from previous day: 10/12 0701 - 10/13 0700 In: 1345 [I.V.:400; Blood:945] Out: -  Intake/Output this shift: No intake/output data recorded.  PE: Gen:  Alert, NAD, pleasant Card:  RRR Pulm:  CTA b/l, normal rate and effort  Abd: Soft, NT/ND, +BS Ext:  L BKA. No right LE edema Skin: no rashes noted, warm and dry  Lab Results:  Recent Labs    11/15/18 1627 11/15/18 1928  WBC 3.5* 3.4*  HGB 5.8* 5.4*  HCT 18.8* 17.4*  PLT 110* 95*   BMET Recent Labs    11/15/18 1627 11/16/18 0448  NA 141 143  K 3.3* 3.3*  CL 114* 116*  CO2 20* 20*  GLUCOSE 204* 116*  BUN 49* 49*  CREATININE 2.56* 2.52*  CALCIUM 7.8* 7.7*   PT/INR No results for input(s): LABPROT, INR in the last 72 hours. CMP     Component Value Date/Time   NA 143 11/16/2018 0448   NA 139 02/11/2017 1220   NA 140 08/16/2015 1554   K 3.3 (L) 11/16/2018 0448   K 3.8 08/16/2015 1554   CL 116 (H) 11/16/2018 0448   CO2 20 (L) 11/16/2018 0448   CO2 21 (L) 08/16/2015 1554   GLUCOSE 116 (H) 11/16/2018 0448   GLUCOSE 133 08/16/2015 1554   BUN 49 (H) 11/16/2018 0448   BUN 54 (H) 02/11/2017 1220   BUN 51.0 (H) 08/16/2015 1554   CREATININE 2.52 (H) 11/16/2018 0448   CREATININE 3.64 (H) 01/09/2016 1606   CREATININE 2.7 (H) 08/16/2015 1554   CALCIUM 7.7 (L) 11/16/2018 0448   CALCIUM 8.4 (L) 05/10/2018 1050   CALCIUM  8.9 08/16/2015 1554   PROT 5.6 (L) 11/15/2018 1627   PROT 6.6 01/16/2017 1339   PROT 7.0 08/16/2015 1554   ALBUMIN 2.9 (L) 11/15/2018 1627   ALBUMIN 3.4 (L) 08/16/2015 1554   AST 17 11/15/2018 1627   AST 17 08/16/2015 1554   ALT 13 11/15/2018 1627   ALT 13 08/16/2015 1554   ALKPHOS 96 11/15/2018 1627   ALKPHOS 89 08/16/2015 1554   BILITOT 0.7 11/15/2018 1627   BILITOT 0.30 08/16/2015 1554   GFRNONAA 25 (L) 11/16/2018 0448   GFRNONAA 27 (L) 07/20/2015 0949   GFRAA 29 (L) 11/16/2018 0448   GFRAA 31 (L) 07/20/2015 0949   Lipase     Component Value Date/Time   LIPASE 45 07/15/2018 1455       Studies/Results: No results found.  Anti-infectives: Anti-infectives (From admission, onward)   None       Assessment/Plan COPD Gold Stage III Pulm HTN CHF CKD IV DM2 HTN CAD s/p CABG 2015 PVD s/p left BKA Hypothyroidism  Pancytopenia  Abdominal Aneurysm on CT Acute on Chronic Anemia - Hgb 5.4 on admission. S/p 2U blood. CBC pending. 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 yesterday to discuss surgery vs palliative care discussions. Patient reports to me this morning that he does not wish for surgery. There is no family at bedside. I agree with medicines note for a palliative care consult for goals of care discussion. I will notify [name] that patient is here and discuss case further with her. We will continue to follow.   FEN - FLD VTE - On hold for anemia  ID - None     LOS: 1 day    Jillyn Ledger , [facility] 11/16/2018, 8:21 AM Pager: [phone]

## 2018-11-16 NOTE — ED Notes (Signed)
Pt placed on 1L of O2 [place] due to O2 saturation to 87. Pt states he wears O2 at night.

## 2018-11-16 NOTE — Plan of Care (Signed)

## 2018-11-16 NOTE — ED Notes (Signed)
Pt urinated on floor and on the edge of bed. Linens changed, pt assisted with peri-care, and pt educated on using the call light.

## 2018-11-16 NOTE — Consult Note (Signed)
Chief Complaint: Patient was seen in consultation today for possible GDA embolization.  Referring Physician(s): Dr. Tana Coast  Supervising Physician: Markus Daft  Patient Status: [facility] - ED  History of Present Illness: Leonard [name] is a 68 y.o. male with a past medical history significant for CHF, CAD, HTN, HLD, previous MI s/p CABG x3, PAD, PVD s/p left BKA, DM, COPD, CKD with left upper extremity fistula (not yet used per patient), anemia with history of multiple blood transfusions and gastric cancer s/p radiation who presented to [facility] ED yesterday after being seen by [name] earlier that day where his hgb was noted to be 5.6 - he was sent by [facility] for further evaluation. He has a history of significant anemia 2/2 bleeding gastric adenocarcinoma - he was admitted on 11/01/18 with hgb of 4.7 and received 4 units PRBCs. During his previous admission he underwent an EGD with GI on 9/29 which confirmed bleeding gastric tumor, however it was not amenable to endoscopic intervention. General surgery was consulted as well and felt that he was neither a surgical nor chemotherapy candidate due to significant comorbidities. He was discharged on 10/4 with hgb of 8.3.   Per his visit note with surgery from yesterday options were thoroughly discussed with the patient, his niece and his sister as well as the significant risk of serious complications and death - palliative care was recommended to him but surgery was offered, however the family wanted time to consider their options. He was seen by GI while in the ED and EGD with hemospray was discussed as an option for short term relief however given his elevated risk and low yield of therapeutic intervention he did not wish to proceed with this option. IR has been consulted for possible GDA embolization.   Mr. Hilgert is somewhat of a poor historian however he does state that he has bleeding from his cancer and acknowledges that he has had several  transfusions because of this bleeding. He states that he has been told that he has blood in his stool but he has not seen it. He denies any hemoptysis, hematemesis, hematuria or any other abnormal bleeding. He denies any complaints currently and states that he "wants to live" and would like to have something done but is not sure what he would like to do yet - he states that he needs to discuss everything with his family first.    Past Medical History:  Diagnosis Date   Acute on chronic diastolic heart failure ([place]) 07/04/2014   Anemia of renal disease    Atherosclerotic peripheral vascular disease with gangrene ([place]) 02/14/2017   CAD (coronary artery disease) 04/06/2013   Cancer ([place])    skin cancer-   CHF (congestive heart failure) ([place])    Chronic anemia 02/28/2016   Chronic deep vein thrombosis (DVT) of femoral vein of left lower extremity ([name]) 09/03/2016   Chronic respiratory failure with hypoxia ([place]) 08/25/2017   CKD (chronic kidney disease) stage 4, GFR 15-29 ml/min ([place]) 01/15/2015   Follows with [facility].  Last visits:  07/23/16: No change in meds. Worried that if we increase diuretics could lead to another AKI   CKD (chronic kidney disease), stage IV ([place])    07/07/2017   Claudication ([place])    Collapse of left talus 01/14/2017   COPD GOLD III  02/16/2014   Quit smoking 2014  - PFT's  04/06/13   FEV1 1.28 (44 % ) ratio 48  p no %  improvement from saba p ?  prior to study with DLCO  43/54 % corrects to 97  % for alv volume - 01/31/2016  After extensive coaching HFA effectiveness =    75% from baseline 0 > changed to sprivia respimat   - PFT's  03/17/2016  FEV1 2.25  (81 % ) ratio 88  p 2 % improvement from saba p spiriva respimat x 2  prior to study with   Coronary artery disease    Diabetes ([place])    Diabetic polyneuropathy associated with type 2 diabetes mellitus ([place]) 01/14/2017   Diabetic retinopathy ([place]) 11/13/2015   Per Dr. Erenest Rasher eye exam in 10/17/15, mild  background diabetic retinopathy   Fatigue associated with anemia 01/21/2016   Gangrene of toe ([place])    right 5th/notes 01/04/2013    HCAP (healthcare-associated pneumonia)    Hematuria 06/27/2015   High cholesterol    History of tobacco use 08/23/2013   Smoked pack per day for 46 years Quit smoking - 02/03/2013    Hypertension    Hypothyroidism 11/22/2014   Idiopathic chronic gout of left foot without tophus    Iron deficiency anemia 08/03/2015   Left foot pain 10/29/2016   Long term (current) use of anticoagulants [Z79.01] 09/09/2016   MGUS (monoclonal gammopathy of unknown significance) 01/15/2015   Mixed hyperlipidemia 07/17/2008   Morbid obesity due to excess calories ([place]) 03/17/2016   PAD (peripheral artery disease) (H[facility])    Pleural effusion    Pneumonitis 07/26/2017   Pulmonary artery hypertension ([place]) 07/25/2017   PVD - hx of Rt SFA PTA and s/p Rt 4-5th toe amp Dec 2014 04/05/2013   Resistant hypertension 11/25/2012   Respiratory failure ([place]) 08/13/2017   Respiratory failure with hypoxia ([place]) 09/23/2017   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   Secondary hypertension    Shortness of breath dyspnea    with exertion   Skin lesion of face 02/22/2015   SOB (shortness of breath)    Type 2 diabetes mellitus with pressure callus ([place]) 12/29/2016   Type II diabetes mellitus ([place])    Type II   Type II diabetes mellitus with complication [facility])     Past Surgical History:  Procedure Laterality Date   ABDOMINAL AORTOGRAM W/LOWER EXTREMITY N/A 03/12/2017   Procedure: ABDOMINAL AORTOGRAM W/LOWER EXTREMITY;  Surgeon: Conrad Northfork, MD;  Location: [place] [facility];  Service: Cardiovascular;  Laterality: N/A;   AMPUTATION Right 01/28/2013   Procedure: RAY AMPUTATION RIGHT  4th & 5th TOE;  Surgeon: Rosetta Posner, MD;  Location: [place];  Service: Vascular;  Laterality: Right;   AMPUTATION Left 03/15/2017   Procedure: AMPUTATION ABOVE KNEE;  Surgeon: Waynetta Sandy, MD;   Location: [place];  Service: Vascular;  Laterality: Left;   ANGIOPLASTY / STENTING FEMORAL Right 01/13/2013   superficial femoral artery x 2 (3 mm x 60 mm, 4 mm x 60 mm)   AV FISTULA PLACEMENT Left 02/11/2016   Procedure: LEFT UPPER ARM BRACHIAL CEPHALIC ARTERIOVENOUS (AV) FISTULA CREATION;  Surgeon: Conrad Atglen, MD;  Location: [place];  Service: Vascular;  Laterality: Left;   BIOPSY  03/29/2018   Procedure: BIOPSY;  Surgeon: Irving Copas., MD;  Location: [place];  Service: Gastroenterology;;   BIOPSY  05/31/2018   Procedure: BIOPSY;  Surgeon: Irving Copas., MD;  Location: [place];  Service: Gastroenterology;;   COLONOSCOPY W/ POLYPECTOMY     CORONARY ARTERY BYPASS GRAFT N/A 04/07/2013   Procedure: CORONARY ARTERY BYPASS GRAFTING (CABG);  Surgeon: Ivin Poot, MD;  Location: [place];  Service: Open Heart Surgery;  Laterality: N/A;   ESOPHAGOGASTRODUODENOSCOPY (EGD) WITH PROPOFOL N/A 03/29/2018   Procedure: ESOPHAGOGASTRODUODENOSCOPY (EGD) WITH PROPOFOL;  Surgeon: Rush Landmark Telford Nab., MD;  Location: [place];  Service: Gastroenterology;  Laterality: N/A;   ESOPHAGOGASTRODUODENOSCOPY (EGD) WITH PROPOFOL N/A 05/31/2018   Procedure: ESOPHAGOGASTRODUODENOSCOPY (EGD) WITH PROPOFOL;  Surgeon: Rush Landmark Telford Nab., MD;  Location: [place];  Service: Gastroenterology;  Laterality: N/A;   ESOPHAGOGASTRODUODENOSCOPY (EGD) WITH PROPOFOL N/A 11/02/2018   Procedure: ESOPHAGOGASTRODUODENOSCOPY (EGD) WITH PROPOFOL;  Surgeon: Jerene Bears, MD;  Location: [facility];  Service: Gastroenterology;  Laterality: N/A;   EUS N/A 05/31/2018   Procedure: UPPER ENDOSCOPIC ULTRASOUND (EUS) RADIAL;  Surgeon: Irving Copas., MD;  Location: [place];  Service: Gastroenterology;  Laterality: N/A;   INTRAOPERATIVE TRANSESOPHAGEAL ECHOCARDIOGRAM N/A 04/07/2013   Procedure: INTRAOPERATIVE TRANSESOPHAGEAL ECHOCARDIOGRAM;  Surgeon: Ivin Poot, MD;  Location: [place];   Service: Open Heart Surgery;  Laterality: N/A;   IR FLUORO GUIDE CV LINE RIGHT  11/04/2017   IR REMOVAL TUN CV CATH W/O FL  11/27/2017   IR US GUIDE VASC ACCESS RIGHT  11/04/2017   LEFT HEART CATHETERIZATION WITH CORONARY ANGIOGRAM N/A 04/05/2013   Procedure: LEFT HEART CATHETERIZATION WITH CORONARY ANGIOGRAM;  Surgeon: Peter M Martinique, MD;  Location[facility];  Service: Cardiovascular;  Laterality: N/A;   LOWER EXTREMITY ANGIOGRAM Bilateral 01/06/2013   Procedure: LOWER EXTREMITY ANGIOGRAM;  Surgeon: Conrad Garland, MD;  Location: [facility] CATH LAB;  Service: Cardiovascular;  Laterality: Bilateral;   LOWER EXTREMITY ANGIOGRAM Right 01/13/2013   Procedure: LOWER EXTREMITY ANGIOGRAM;  Surgeon: Conrad Yale, MD;  Location: [facility] CATH LAB;  Service: Cardiovascular;  Laterality: Right;   SKIN CANCER EXCISION  2017   THROAT SURGERY  1983   polyps   TONSILLECTOMY AND ADENOIDECTOMY  ~ 1965    Allergies: Penicillins  Medications: Prior to Admission medications   Medication Sig Start Date End Date Taking? Authorizing Provider  acetaminophen (TYLENOL) 325 MG tablet Take 650 mg by mouth every 6 (six) hours as needed for fever.   Yes [provider]  amLODipine (NORVASC) 5 MG tablet Take 1 tablet (5 mg total) by mouth daily. 07/12/18  Yes Tammi Klippel, Sherin, DO  ascorbic acid (VITAMIN C) 500 MG tablet Take 500 mg by mouth daily.   Yes [provider]  atorvastatin (LIPITOR) 40 MG tablet TAKE ONE (1) TABLET BY MOUTH EVERY DAY AT 6:00PM Patient taking differently: Take 40 mg by mouth daily at 6 PM.  11/17/16  Yes Larey Dresser, MD  carvedilol (COREG) 12.5 MG tablet Take 1 tablet (12.5 mg total) by mouth 2 (two) times daily with a meal. 08/30/17  Yes Meccariello, Bernita Raisin, DO  cholecalciferol (VITAMIN D) 25 MCG (1000 UT) tablet Take 2,000 Units by mouth 3 (three) times daily.   Yes [provider]  cloNIDine (CATAPRES - DOSED IN MG/24 HR) 0.2 mg/24hr patch Place 1 patch (0.2 mg  total) onto the skin once a week. 08/09/17  Yes Steve Rattler, DO  Darbepoetin Alfa (ARANESP) 40 MCG/0.4ML SOSY injection Inject 0.4 mLs (40 mcg total) into the skin every 30 (thirty) days. 09/28/17  Yes Meccariello, Bernita Raisin, DO  doxazosin (CARDURA) 8 MG tablet Take 1 tablet (8 mg total) by mouth every evening. Patient taking differently: Take 8 mg by mouth daily.  12/01/16  Yes Hensel, Jamal Collin, MD  feeding supplement, ENSURE ENLIVE, (ENSURE ENLIVE) LIQD Take 237  mLs by mouth 3 (three) times daily between meals. 08/07/17  Yes Riccio, Angela C, DO  ferrous sulfate (FEROSUL) 325 (65 FE) MG tablet Take 1 tablet (325 mg total) by mouth daily at 12 noon. Patient taking differently: Take 325 mg by mouth daily.  08/31/17  Yes Meccariello, Bernita Raisin, DO  folic acid (FOLVITE) 1 MG tablet Take 1 tablet (1 mg total) by mouth daily. 11/06/18 11/06/19 Yes Mercy Riding, MD  Glycopyrrolate (LONHALA MAGNAIR STARTER KIT) 25 MCG/ML SOLN Inhale 1 mL into the lungs 2 (two) times a day.   Yes [provider]  hydrALAZINE (APRESOLINE) 100 MG tablet TAKE ONE TABLET BY MOUTH EVERY EIGHT HOURS Patient taking differently: Take 100 mg by mouth every 8 (eight) hours.  11/14/16  Yes [name], MD  insulin aspart (NOVOLOG) 100 UNIT/ML injection Inject 0-10 Units into the skin 3 (three) times daily before meals. <150= 0 units, 151-200= 2 units, 201-250= 4 units, 251-300= 6 units, 301-350= 8 units, 351-400= 10 units   Yes [provider]  isosorbide mononitrate (IMDUR) 120 MG 24 hr tablet Take 1 tablet (120 mg total) by mouth daily. 10/02/17  Yes [place], Chelsey L, DO  levothyroxine (SYNTHROID, LEVOTHROID) 100 MCG tablet Take 100 mcg by mouth daily before breakfast.    Yes [provider]  nitroGLYCERIN (NITROSTAT) 0.4 MG SL tablet Place 1 tablet (0.4 mg total) under the tongue every 5 (five) minutes as needed for chest pain. Patient taking differently: Place 0.4 mg under the tongue every 5  (five) minutes as needed for chest pain. Chest pain 11/14/16  Yes [name], MD  ondansetron (ZOFRAN) 4 MG tablet Take 4 mg by mouth every 8 (eight) hours as needed for nausea or vomiting.   Yes [provider]  pantoprazole (PROTONIX) 40 MG tablet Take 1 tablet (40 mg total) by mouth 2 (two) times daily. 04/03/18  Yes Beard, Aldona Bar N, DO  polyethylene glycol (MIRALAX / GLYCOLAX) packet Take 17 g by mouth daily as needed for mild constipation or moderate constipation. 08/07/17  Yes Riccio, Angela C, DO  sucralfate (CARAFATE) 1 g tablet Take 1 tablet (1 g total) by mouth 4 (four) times daily -  with meals and at bedtime. 11/06/18  Yes Mercy Riding, MD  torsemide (DEMADEX) 20 MG tablet Take 2 tablets (40 mg total) by mouth daily. 02/16/18  Yes Larey Dresser, MD  umeclidinium bromide (INCRUSE ELLIPTA) 62.5 MCG/INH AEPB Inhale 1 puff into the lungs daily. 06/28/18  Yes Mullis, Kiersten P, DO  Blood Glucose Monitoring Suppl (ONETOUCH VERIO FLEX SYSTEM) w/Device KIT 1 kit by Does not apply route daily. 10/10/16   McDiarmid, Blane Ohara, MD  glucose blood (ONETOUCH VERIO) test strip Use as instructed to test three times daily. ICD-10 code: E11.22 10/15/16   McDiarmid, Blane Ohara, MD  [facility] DELICA LANCETS 83M MISC 1 each by Does not apply route 3 (three) times daily. ICD-10 code: E11.22 Patient not taking: Reported on 07/15/2018 10/15/16   McDiarmid, Blane Ohara, MD     Family History  Problem Relation Age of Onset   Cancer Mother        lung cancer   Hypertension Mother    Cancer Sister        patient thinks it was uterine cancer   Hypertension Sister    Heart attack Sister    Stroke Neg Hx    Colon cancer Neg Hx    Esophageal cancer Neg Hx  Inflammatory bowel disease Neg Hx    Pancreatic cancer Neg Hx    Liver disease Neg Hx    Rectal cancer Neg Hx    Stomach cancer Neg Hx     Social History   Socioeconomic History   Marital status: Single    Spouse name: Not on file    Number of children: 0   Years of education: Not on file   Highest education level: Not on file  Occupational History   Occupation: disabled  Social Designer, fashion/clothing strain: Not on file   Food insecurity    Worry: Not on file    Inability: Not on file   Transportation needs    Medical: No    Non-medical: No  Tobacco Use   Smoking status: Current Every Day Smoker    Packs/day: 1.00    Years: 46.00    Pack years: 46.00    Types: Cigarettes    Start date: 02/04/1967   Smokeless tobacco: Never Used  Substance and Sexual Activity   Alcohol use: No    Alcohol/week: 0.0 standard drinks   Drug use: No   Sexual activity: Not Currently  Lifestyle   Physical activity    Days per week: Not on file    Minutes per session: Not on file   Stress: Not on file  Relationships   Social connections    Talks on phone: Patient refused    Gets together: Patient refused    Attends religious service: Patient refused    Active member of club or organization: Patient refused    Attends meetings of clubs or organizations: Patient refused    Relationship status: Patient refused  Other Topics Concern   Not on file  Social History Narrative   Retired - former Sports coach.   Patient in skilled nursing facility.     Review of Systems: A 12 point ROS discussed and pertinent positives are indicated in the HPI above.  All other systems are negative.  Review of Systems  Constitutional: Negative for chills and fever.  HENT: Negative for nosebleeds.   Respiratory: Negative for cough and shortness of breath.   Gastrointestinal: Positive for blood in stool. Negative for abdominal pain, nausea and vomiting.  Genitourinary: Negative for hematuria.  Musculoskeletal: Negative for back pain.  Neurological: Negative for dizziness and headaches.    Vital Signs: BP (!) 155/60    Pulse 69    Temp 98.4 F (36.9 C) (Oral)    Resp 15    SpO2 99%   Physical Exam Vitals signs  and nursing note reviewed.  Constitutional:      General: He is not in acute distress. HENT:     Head: Normocephalic.  Cardiovascular:     Rate and Rhythm: Normal rate and regular rhythm.     Comments: (+) large midline scar - per patient previous CABG Pulmonary:     Effort: Pulmonary effort is normal.     Breath sounds: Normal breath sounds.     Comments: (+) O2 via [name] Abdominal:     General: There is no distension.     Palpations: Abdomen is soft.     Tenderness: There is no abdominal tenderness.  Musculoskeletal:     Comments: (+) Left BKA  Skin:    General: Skin is warm and dry.  Neurological:     Mental Status: He is alert and oriented to person, place, and time.  Psychiatric:        Mood  and Affect: Mood normal.        Behavior: Behavior normal.        Thought Content: Thought content normal.        Judgment: Judgment normal.      Imaging: Ct Abdomen Pelvis Wo Contrast  Result Date: 11/04/2018 CLINICAL DATA:  Gastric cancer, evaluate for metastatic disease, history of GI bleed. EXAM: CT CHEST, ABDOMEN AND PELVIS WITHOUT CONTRAST TECHNIQUE: Multidetector CT imaging of the chest, abdomen and pelvis was performed following the standard protocol without IV contrast. COMPARISON:  March 31, 2018 03/28/2018 FINDINGS: CT CHEST FINDINGS Cardiovascular: Extensive calcifications throughout the thoracic aorta with signs of calcified coronary artery disease similar to previous study. Heart size remains enlarged without pericardial effusion and without significant change from previous exam. Mild engorgement of central pulmonary vasculature. Mediastinum/Nodes: Scattered lymph nodes throughout the mediastinum, largest along the right paratracheal chain measuring 1.2 cm previously 0.8 cm. Subcarinal lymph node also showing enlargement at approximately 1.9 cm short axis, previously 1 cm. Lungs/Pleura: Patchy areas of ground-glass attenuation have developed since 03/31/2018 also with some  signs of septal thickening, basilar consolidation and small to moderate bilateral pleural effusions right greater than left Musculoskeletal: No signs of chest wall mass. Signs of median sternotomy for coronary revascularization similar to previous exams. CT ABDOMEN PELVIS FINDINGS Hepatobiliary: Limited assessment on noncontrast evaluation. No suspicious focal hepatic lesion. No signs of biliary ductal dilation or pericholecystic inflammation. Pancreas: Unremarkable. No pancreatic ductal dilatation or surrounding inflammatory changes. Spleen: Normal in size without focal abnormality. Adrenals/Urinary Tract: Normal appearance of bilateral adrenal glands. Mild perinephric stranding bilaterally without signs of hydronephrosis. Similar to prior study. Presumed cyst arises from the lower pole of left kidney. Stomach/Bowel: No signs of acute gastrointestinal process. Scattered colonic diverticulosis favoring right hemicolon. Normal appearance of the appendix. Mild gastric thickening as before, not well evaluated. 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 Bilateral common iliac arterial dilation and aneurysmal caliber is also unchanged, as measured by this observer on the current and previous study. No signs of adenopathy in the upper abdomen or retroperitoneum. No signs of pelvic lymphadenopathy. Reproductive: Prostate not well evaluated.  Grossly unremarkable. Other: Small volume ascites in the pelvis.  No signs of free air. Musculoskeletal: Sternotomy changes as above. No signs of acute musculoskeletal process. IMPRESSION: 1. Bilateral effusions right greater than left, associated with basilar consolidative changes and patchy airspace opacities as well as interstitial thickening. Could potentially represent a combination of heart failure in the background with an overlay of pneumonitis. Correlation with BNP may be helpful. 2. Enlarging  lymph nodes in the chest may be indicative of metastatic disease though in the setting of potential heart failure and/or infection remaining nonspecific at this time. 3. Cardiac enlargement with signs of generalized atherosclerosis and coronary artery disease. 4. 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 5. Ascites further supporting the potential for cardiac dysfunction or volume overload. Electronically Signed   By: Zetta Bills M.D.   On: 11/04/2018 08:16   Dg Chest 1 View  Result Date: 11/04/2018 CLINICAL DATA:  Status post right-sided thoracentesis. EXAM: CHEST  1 VIEW COMPARISON:  CT, 11/03/2018.  Radiographs, 06/23/2018. FINDINGS: Right pleural effusion is decreased following thoracentesis. There is no pneumothorax. Left pleural effusion persists. There is hazy bilateral airspace lung opacities similar to the prior chest CT. No new lung abnormalities. Stable changes from prior CABG surgery. IMPRESSION: 1. Decreased right pleural effusion following  thoracentesis. No pneumothorax or evidence of a procedure complication. Electronically Signed   By: Lajean Manes M.D.   On: 11/04/2018 16:45   Ct Chest Wo Contrast  Result Date: 11/04/2018 CLINICAL DATA:  Gastric cancer, evaluate for metastatic disease, history of GI bleed. EXAM: CT CHEST, ABDOMEN AND PELVIS WITHOUT CONTRAST TECHNIQUE: Multidetector CT imaging of the chest, abdomen and pelvis was performed following the standard protocol without IV contrast. COMPARISON:  March 31, 2018 03/28/2018 FINDINGS: CT CHEST FINDINGS Cardiovascular: Extensive calcifications throughout the thoracic aorta with signs of calcified coronary artery disease similar to previous study. Heart size remains enlarged without pericardial effusion and without significant change from previous exam. Mild engorgement of central pulmonary vasculature. Mediastinum/Nodes: Scattered lymph nodes throughout the mediastinum, largest along the right paratracheal chain  measuring 1.2 cm previously 0.8 cm. Subcarinal lymph node also showing enlargement at approximately 1.9 cm short axis, previously 1 cm. Lungs/Pleura: Patchy areas of ground-glass attenuation have developed since 03/31/2018 also with some signs of septal thickening, basilar consolidation and small to moderate bilateral pleural effusions right greater than left Musculoskeletal: No signs of chest wall mass. Signs of median sternotomy for coronary revascularization similar to previous exams. CT ABDOMEN PELVIS FINDINGS Hepatobiliary: Limited assessment on noncontrast evaluation. No suspicious focal hepatic lesion. No signs of biliary ductal dilation or pericholecystic inflammation. Pancreas: Unremarkable. No pancreatic ductal dilatation or surrounding inflammatory changes. Spleen: Normal in size without focal abnormality. Adrenals/Urinary Tract: Normal appearance of bilateral adrenal glands. Mild perinephric stranding bilaterally without signs of hydronephrosis. Similar to prior study. Presumed cyst arises from the lower pole of left kidney. Stomach/Bowel: No signs of acute gastrointestinal process. Scattered colonic diverticulosis favoring right hemicolon. Normal appearance of the appendix. Mild gastric thickening as before, not well evaluated. 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 Bilateral common iliac arterial dilation and aneurysmal caliber is also unchanged, as measured by this observer on the current and previous study. No signs of adenopathy in the upper abdomen or retroperitoneum. No signs of pelvic lymphadenopathy. Reproductive: Prostate not well evaluated.  Grossly unremarkable. Other: Small volume ascites in the pelvis.  No signs of free air. Musculoskeletal: Sternotomy changes as above. No signs of acute musculoskeletal process. IMPRESSION: 1. Bilateral effusions right greater than left, associated with basilar  consolidative changes and patchy airspace opacities as well as interstitial thickening. Could potentially represent a combination of heart failure in the background with an overlay of pneumonitis. Correlation with BNP may be helpful. 2. Enlarging lymph nodes in the chest may be indicative of metastatic disease though in the setting of potential heart failure and/or infection remaining nonspecific at this time. 3. Cardiac enlargement with signs of generalized atherosclerosis and coronary artery disease. 4. 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 5. Ascites further supporting the potential for cardiac dysfunction or volume overload. Electronically Signed   By: Zetta Bills M.D.   On: 11/04/2018 08:16   US Thoracentesis Asp Pleural Space W/img Guide  Result Date: 11/04/2018 INDICATION: Patient with history of gastric cancer with recent GI bleed, coronary artery disease with CABG in 2015, COPD, chronic kidney disease, bilateral pleural effusions right greater than left. Request received for diagnostic and therapeutic right thoracentesis. EXAM: ULTRASOUND GUIDED DIAGNOSTIC AND THERAPEUTIC RIGHT THORACENTESIS MEDICATIONS: None COMPLICATIONS: None immediate. PROCEDURE: An ultrasound guided thoracentesis was thoroughly discussed with the patient and questions answered. The benefits, risks, alternatives and complications were also discussed. The patient understands and wishes to proceed  with the procedure. Written consent was obtained. Ultrasound was performed to localize and mark an adequate pocket of fluid in the right chest. The area was then prepped and draped in the normal sterile fashion. 1% Lidocaine was used for local anesthesia. Under ultrasound guidance a 6 Fr Safe-T-Centesis catheter was introduced. Thoracentesis was performed. The catheter was removed and a dressing applied. FINDINGS: A total of approximately 1 liter of hazy, amber fluid was removed. Samples were sent to the laboratory as  requested by the clinical team. IMPRESSION: Successful ultrasound guided diagnostic and therapeutic right thoracentesis yielding 1 liter of pleural fluid. Read by: Rowe Robert, PA-C Electronically Signed   By: Constance Holster M.D.   On: 11/04/2018 16:35    Labs:  CBC: Recent Labs    11/07/18 0359 11/15/18 1627 11/15/18 1928 11/16/18 1039  WBC 4.4 3.5* 3.4* 3.8*  HGB 8.3* 5.8* 5.4* 7.0*  HCT 26.9* 18.8* 17.4* 22.2*  PLT 83* 110* 95* 82*    COAGS: Recent Labs    03/28/18 1758 06/23/18 2039 06/24/18 0844 06/26/18 0517  INR 1.19 1.1 1.1 1.1  APTT  --   --  31 30    BMP: Recent Labs    11/06/18 0454 11/07/18 0359 11/15/18 1627 11/16/18 0448  NA 142 143 141 143  K 3.3* 3.6 3.3* 3.3*  CL 113* 114* 114* 116*  CO2 20* 21* 20* 20*  GLUCOSE 114* 116* 204* 116*  BUN 49* 52* 49* 49*  CALCIUM 7.6* 7.7* 7.8* 7.7*  CREATININE 2.81* 2.83* 2.56* 2.52*  GFRNONAA 22* 22* 25* 25*  GFRAA 26* 26* 29* 29*    LIVER FUNCTION TESTS: Recent Labs    07/21/18 0809 11/02/18 0531 11/04/18 1712 11/15/18 1627  BILITOT 0.2* 1.2 0.7 0.7  AST 12* 14* 13* 17  ALT _0 ALKPHOS 74 101 105 96  PROT 5.7* 5.4* 5.4* 5.6*  ALBUMIN 3.1* 3.0* 2.8* 2.9*    TUMOR MARKERS: No results for input(s): AFPTM, CEA, CA199, CHROMGRNA in the last 8760 hours.  Assessment and Plan:  68 y/o M with known gastric adenocarcinoma s/p radiation followed by general surgery currently who presented to [facility] ED yesterday for hgb of 5.8. He has a history of significant anemia requiring several transfusions in the last month or so. He was seen by general surgery yesterday as an outpatient and was offered surgical intervention with the understanding that he is very high risk for serious complications and death - patient stated at that time that he wanted to discuss options with his family members. He was seen by GI this admission and offered EGD with hemospray as a short term measure however given low yield and  high risk of complications he declined this option. IR has been consulted for possible GDA embolization.  Patient was seen in the ED today by myself and Dr. Anselm Pancoast -- thorough discussion of the procedure indications and risks were discussed with the patient. It was discussed in detail that he remains a high risk for complications with any procedure, including GDA embolization, given his significant comorbidities and that GDA embolization could potentially be performed at this juncture however it would likely be low yield in this situation. Patient stated understanding to all of the above and asked to discuss his options with his family.  IR will be available if emergent embolization is needed, however at this time the risks of proceeding with GDA embolization on a non-emergent basis likely outweigh any benefit.   IR will continue  to follow peripherally for now - please call with any questions or concerns.   Thank you for this interesting consult.  I greatly enjoyed meeting SAW MENDENHALL and look forward to participating in their care.  A copy of this report was sent to the requesting provider on this date.  Electronically Signed: Joaquim Nam, PA-C 11/16/2018, 3:58 PM   I spent a total of 40 Minutes  in face to face in clinical consultation, greater than 50% of which was counseling/coordinating care for possible GDA embolization.

## 2018-11-16 NOTE — Progress Notes (Signed)
[facility]                                                                              Patient Demographics  Leonard [name], is a 68 y.o. male, DOB - [birth year], ZYS:[id number]  Admit date - 11/15/2018   Admitting Physician No admitting provider for patient encounter.  Outpatient Primary MD for the patient is Winfrey, Alcario Drought, MD  Outpatient specialists:   LOS - 1  days   Medical records reviewed and are as summarized below:    Chief Complaint  Patient presents with   Abnormal Lab       Brief summary   Patient is a 68 year old male with diastolic CHF, hypertension, COPD, PVD status post left AKA, CKD stage IV, type 2 diabetes mellitus, history of GI bleed from gastric adenocarcinoma with history of XRT presented for routine lab work and was found to have hemoglobin of 5.8.  No abdominal pain nausea, vomiting or diarrhea, chest pain or shortness of breath.  Patient denied any dizziness or lightheadedness. He was recently hospitalized from 9/28 to 10/4 for symptomatic anemia with hemoglobin down to 4.7.  Received 4 units PRBC throughout his hospitalization.  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  General surgery was also consulted.  Discharge hemoglobin of 8.3. He also underwent thoracentesis for right pleural effusion with removal of 1 L transudate of fluid. Patient apparently went to see general surgery on the day of admission to discuss possible palliative gastrectomy versus radiation.  Patient is unsure which treatment he would like to undergo.  Patient was admitted for further work-up.    Assessment & Plan    Acute on chronic blood loss anemia due to history of gastric adenocarcinoma, GI bleed -Recently discharged on 10/4 with a hemoglobin of 8.3, now 5.8 on admission -Transfused 2 units packed RBCs, continue PPI -GI and general surgery consulted, per surgery, risk  of bleeding high due to thrombocytopenia, platelet dysfunction, recommended palliative medicine consult. -Patient has been seen by gastroenterology, Dr. Bryan Lemma, patient does not want to proceed with repeat endoscopy.  Recommended IR consult for consideration of GDA embolization, transfusion packed RBC, IV iron, PPI and palliative consult. -Interventional radiology consulted, palliative medicine consulted for goals of care   Pancytopenia -Secondary to gastric malignancy -Transfused 2 units packed RBCs  Hypokalemia Replaced  CKD stage IV -Baseline creatinine ~2.8, currently at 2.5 -Currently at baseline -Avoid nephrotoxic medications  Diabetes mellitus type 2 -Continue sliding scale insulin  Hypothyroidism Continue Synthroid  Hypertension -BP currently elevated, continue amlodipine, doxazosin, hydralazine -We will add IV hydralazine as needed  History of CAD -Currently no acute chest pain, continue Imdur, torsemide  COPD Currently stable, no wheezing, continue Incruse Ellipta   PVD s/p left BKA -Not on aspirin due to GI bleed  Code Status: Full CODE STATUS DVT Prophylaxis:  SCD's Family Communication: Discussed all imaging results, lab results, explained to the patient.    Disposition Plan: Remains inpatient and in stepdown, high risk for GI bleeding.  Time Spent in minutes 35 minutes  Procedures:  None  Consultants:   General surgery GI Palliative medicine Interventional radiology  Antimicrobials:   Anti-infectives (From admission, onward)   None          Medications  Scheduled Meds:  amLODipine  5 mg Oral Daily   atorvastatin  40 mg Oral q1800   carvedilol  12.5 mg Oral BID WC   doxazosin  8 mg Oral QPM   ferrous sulfate  325 mg Oral Daily   folic acid  1 mg Oral Daily   hydrALAZINE  100 mg Oral TID   insulin aspart  0-9 Units Subcutaneous TID WC   isosorbide mononitrate  120 mg Oral Daily   levothyroxine  100 mcg Oral  Q0600   pantoprazole (PROTONIX) IV  40 mg Intravenous QHS   torsemide  40 mg Oral Daily   umeclidinium bromide  1 puff Inhalation Daily   Continuous Infusions:  sodium chloride Stopped (11/16/18 0609)   PRN Meds:.      Subjective:   Leonard [name] was seen and examined today.  Mild abdominal pain 4/10 otherwise no nausea or vomiting. Patient denies dizziness, chest pain, shortness of breath,  No acute events overnight.    Objective:   Vitals:   11/16/18 1000 11/16/18 1025 11/16/18 1100 11/16/18 1115  BP: (!) 182/66 (!) 192/78 (!) 168/56 (!) 171/69  Pulse: 67  71 67  Resp: 13  19 19   Temp:      TempSrc:      SpO2: 100%  100% 95%    Intake/Output Summary (Last 24 hours) at 11/16/2018 1257 Last data filed at 11/16/2018 7591 Gross per 24 hour  Intake 1345 ml  Output --  Net 1345 ml     Wt Readings from Last 3 Encounters:  11/06/18 83.1 kg  10/14/18 80.3 kg  07/21/18 79.5 kg     Exam  General: Alert and oriented x3, NAD  Eyes:   HEENT:  Atraumatic, normocephalic  Cardiovascular: S1 S2 auscultated, no murmurs, RRR  Respiratory: Clear to auscultation bilaterally, no wheezing  Gastrointestinal: Soft, nontender, nondistended, + bowel sounds  Ext: Left AKA  Neuro: No new deficits  Musculoskeletal: No digital cyanosis, clubbing  Skin: No rashes  Psych: Normal affect and demeanor, alert and oriented x3    Data Reviewed:  I have personally reviewed following labs and imaging studies  Micro Results Recent Results (from the past 240 hour(s))  SARS Coronavirus 2 by RT PCR (hospital order, performed in [place] hospital lab) Nasopharyngeal Nasopharyngeal Swab     Status: None   Collection Time: 11/15/18  5:58 PM   Specimen: Nasopharyngeal Swab  Result Value Ref Range Status   SARS Coronavirus 2 NEGATIVE NEGATIVE Final    Comment: (NOTE) If result is NEGATIVE SARS-CoV-2 target nucleic acids are NOT DETECTED. The SARS-CoV-2 RNA is generally  detectable in upper and lower  respiratory specimens during the acute phase of infection. The lowest  concentration of SARS-CoV-2 viral copies this assay can detect is 250  copies / mL. A negative result does not preclude SARS-CoV-2 infection  and should not be used as the sole basis for treatment or other  patient management decisions.  A negative result may occur with  improper specimen collection / handling, submission of specimen other  than nasopharyngeal swab, presence of viral mutation(s) within the  areas targeted by this assay, and inadequate number of viral copies  (<250 copies / mL). A negative result must be combined with clinical  observations, patient history, and epidemiological information. If result  is POSITIVE SARS-CoV-2 target nucleic acids are DETECTED. The SARS-CoV-2 RNA is generally detectable in upper and lower  respiratory specimens dur ing the acute phase of infection.  Positive  results are indicative of active infection with SARS-CoV-2.  Clinical  correlation with patient history and other diagnostic information is  necessary to determine patient infection status.  Positive results do  not rule out bacterial infection or co-infection with other viruses. If result is PRESUMPTIVE POSTIVE SARS-CoV-2 nucleic acids MAY BE PRESENT.   A presumptive positive result was obtained on the submitted specimen  and confirmed on repeat testing.  While 2019 novel coronavirus  (SARS-CoV-2) nucleic acids may be present in the submitted sample  additional confirmatory testing may be necessary for epidemiological  and / or clinical management purposes  to differentiate between  SARS-CoV-2 and other Sarbecovirus currently known to infect humans.  If clinically indicated additional testing with an alternate test  methodology [phone]) is advised. The SARS-CoV-2 RNA is generally  detectable in upper and lower respiratory sp ecimens during the acute  phase of infection. The  expected result is Negative. Fact Sheet for Patients:  StrictlyIdeas.no Fact Sheet for Healthcare Providers: BankingDealers.co.za This test is not yet approved or cleared by the [place] FDA and has been authorized for detection and/or diagnosis of SARS-CoV-2 by FDA under an Emergency Use Authorization (EUA).  This EUA will remain in effect (meaning this test can be used) for the duration of the COVID-19 declaration under Section 564(b)(1) of the Act, 21 U.S.C. section 360bbb-3(b)(1), unless the authorization is terminated or revoked sooner. Performed at [facility], [place] [address], [place], [place] [address]     Radiology Reports Ct Abdomen Pelvis Wo Contrast  Result Date: 11/04/2018 CLINICAL DATA:  Gastric cancer, evaluate for metastatic disease, history of GI bleed. EXAM: CT CHEST, ABDOMEN AND PELVIS WITHOUT CONTRAST TECHNIQUE: Multidetector CT imaging of the chest, abdomen and pelvis was performed following the standard protocol without IV contrast. COMPARISON:  March 31, 2018 03/28/2018 FINDINGS: CT CHEST FINDINGS Cardiovascular: Extensive calcifications throughout the thoracic aorta with signs of calcified coronary artery disease similar to previous study. Heart size remains enlarged without pericardial effusion and without significant change from previous exam. Mild engorgement of central pulmonary vasculature. Mediastinum/Nodes: Scattered lymph nodes throughout the mediastinum, largest along the right paratracheal chain measuring 1.2 cm previously 0.8 cm. Subcarinal lymph node also showing enlargement at approximately 1.9 cm short axis, previously 1 cm. Lungs/Pleura: Patchy areas of ground-glass attenuation have developed since 03/31/2018 also with some signs of septal thickening, basilar consolidation and small to moderate bilateral pleural effusions right greater than left Musculoskeletal: No signs of chest wall  mass. Signs of median sternotomy for coronary revascularization similar to previous exams. CT ABDOMEN PELVIS FINDINGS Hepatobiliary: Limited assessment on noncontrast evaluation. No suspicious focal hepatic lesion. No signs of biliary ductal dilation or pericholecystic inflammation. Pancreas: Unremarkable. No pancreatic ductal dilatation or surrounding inflammatory changes. Spleen: Normal in size without focal abnormality. Adrenals/Urinary Tract: Normal appearance of bilateral adrenal glands. Mild perinephric stranding bilaterally without signs of hydronephrosis. Similar to prior study. Presumed cyst arises from the lower pole of left kidney. Stomach/Bowel: No signs of acute gastrointestinal process. Scattered colonic diverticulosis favoring right hemicolon. Normal appearance of the appendix. Mild gastric thickening as before, not well evaluated. 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 Bilateral common iliac arterial dilation and aneurysmal caliber is also unchanged, as measured by this observer  on the current and previous study. No signs of adenopathy in the upper abdomen or retroperitoneum. No signs of pelvic lymphadenopathy. Reproductive: Prostate not well evaluated.  Grossly unremarkable. Other: Small volume ascites in the pelvis.  No signs of free air. Musculoskeletal: Sternotomy changes as above. No signs of acute musculoskeletal process. IMPRESSION: 1. Bilateral effusions right greater than left, associated with basilar consolidative changes and patchy airspace opacities as well as interstitial thickening. Could potentially represent a combination of heart failure in the background with an overlay of pneumonitis. Correlation with BNP may be helpful. 2. Enlarging lymph nodes in the chest may be indicative of metastatic disease though in the setting of potential heart failure and/or infection remaining nonspecific at this  time. 3. Cardiac enlargement with signs of generalized atherosclerosis and coronary artery disease. 4. 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 5. Ascites further supporting the potential for cardiac dysfunction or volume overload. Electronically Signed   By: Zetta Bills M.D.   On: 11/04/2018 08:16   Dg Chest 1 View  Result Date: 11/04/2018 CLINICAL DATA:  Status post right-sided thoracentesis. EXAM: CHEST  1 VIEW COMPARISON:  CT, 11/03/2018.  Radiographs, 06/23/2018. FINDINGS: Right pleural effusion is decreased following thoracentesis. There is no pneumothorax. Left pleural effusion persists. There is hazy bilateral airspace lung opacities similar to the prior chest CT. No new lung abnormalities. Stable changes from prior CABG surgery. IMPRESSION: 1. Decreased right pleural effusion following thoracentesis. No pneumothorax or evidence of a procedure complication. Electronically Signed   By: Lajean Manes M.D.   On: 11/04/2018 16:45   Ct Chest Wo Contrast  Result Date: 11/04/2018 CLINICAL DATA:  Gastric cancer, evaluate for metastatic disease, history of GI bleed. EXAM: CT CHEST, ABDOMEN AND PELVIS WITHOUT CONTRAST TECHNIQUE: Multidetector CT imaging of the chest, abdomen and pelvis was performed following the standard protocol without IV contrast. COMPARISON:  March 31, 2018 03/28/2018 FINDINGS: CT CHEST FINDINGS Cardiovascular: Extensive calcifications throughout the thoracic aorta with signs of calcified coronary artery disease similar to previous study. Heart size remains enlarged without pericardial effusion and without significant change from previous exam. Mild engorgement of central pulmonary vasculature. Mediastinum/Nodes: Scattered lymph nodes throughout the mediastinum, largest along the right paratracheal chain measuring 1.2 cm previously 0.8 cm. Subcarinal lymph node also showing enlargement at approximately 1.9 cm short axis, previously 1 cm. Lungs/Pleura: Patchy areas  of ground-glass attenuation have developed since 03/31/2018 also with some signs of septal thickening, basilar consolidation and small to moderate bilateral pleural effusions right greater than left Musculoskeletal: No signs of chest wall mass. Signs of median sternotomy for coronary revascularization similar to previous exams. CT ABDOMEN PELVIS FINDINGS Hepatobiliary: Limited assessment on noncontrast evaluation. No suspicious focal hepatic lesion. No signs of biliary ductal dilation or pericholecystic inflammation. Pancreas: Unremarkable. No pancreatic ductal dilatation or surrounding inflammatory changes. Spleen: Normal in size without focal abnormality. Adrenals/Urinary Tract: Normal appearance of bilateral adrenal glands. Mild perinephric stranding bilaterally without signs of hydronephrosis. Similar to prior study. Presumed cyst arises from the lower pole of left kidney. Stomach/Bowel: No signs of acute gastrointestinal process. Scattered colonic diverticulosis favoring right hemicolon. Normal appearance of the appendix. Mild gastric thickening as before, not well evaluated. 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 Bilateral common iliac arterial dilation and aneurysmal caliber is also unchanged, as measured by this observer on the current and previous study. No signs of adenopathy in the upper abdomen or retroperitoneum. No signs  of pelvic lymphadenopathy. Reproductive: Prostate not well evaluated.  Grossly unremarkable. Other: Small volume ascites in the pelvis.  No signs of free air. Musculoskeletal: Sternotomy changes as above. No signs of acute musculoskeletal process. IMPRESSION: 1. Bilateral effusions right greater than left, associated with basilar consolidative changes and patchy airspace opacities as well as interstitial thickening. Could potentially represent a combination of heart failure in the background with an  overlay of pneumonitis. Correlation with BNP may be helpful. 2. Enlarging lymph nodes in the chest may be indicative of metastatic disease though in the setting of potential heart failure and/or infection remaining nonspecific at this time. 3. Cardiac enlargement with signs of generalized atherosclerosis and coronary artery disease. 4. 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 5. Ascites further supporting the potential for cardiac dysfunction or volume overload. Electronically Signed   By: Zetta Bills M.D.   On: 11/04/2018 08:16   US Thoracentesis Asp Pleural Space W/img Guide  Result Date: 11/04/2018 INDICATION: Patient with history of gastric cancer with recent GI bleed, coronary artery disease with CABG in 2015, COPD, chronic kidney disease, bilateral pleural effusions right greater than left. Request received for diagnostic and therapeutic right thoracentesis. EXAM: ULTRASOUND GUIDED DIAGNOSTIC AND THERAPEUTIC RIGHT THORACENTESIS MEDICATIONS: None COMPLICATIONS: None immediate. PROCEDURE: An ultrasound guided thoracentesis was thoroughly discussed with the patient and questions answered. The benefits, risks, alternatives and complications were also discussed. The patient understands and wishes to proceed with the procedure. Written consent was obtained. Ultrasound was performed to localize and mark an adequate pocket of fluid in the right chest. The area was then prepped and draped in the normal sterile fashion. 1% Lidocaine was used for local anesthesia. Under ultrasound guidance a 6 Fr Safe-T-Centesis catheter was introduced. Thoracentesis was performed. The catheter was removed and a dressing applied. FINDINGS: A total of approximately 1 liter of hazy, amber fluid was removed. Samples were sent to the laboratory as requested by the clinical team. IMPRESSION: Successful ultrasound guided diagnostic and therapeutic right thoracentesis yielding 1 liter of pleural fluid. Read by: Rowe Robert, PA-C Electronically Signed   By: Constance Holster M.D.   On: 11/04/2018 16:35    Lab Data:  CBC: Recent Labs  Lab 11/15/18 1627 11/15/18 1928 11/16/18 1039  WBC 3.5* 3.4* 3.8*  HGB 5.8* 5.4* 7.0*  HCT 18.8* 17.4* 22.2*  MCV 95.4 95.6 93.7  PLT 110* 95* 82*   Basic Metabolic Panel: Recent Labs  Lab 11/15/18 1627 11/16/18 0448  NA 141 143  K 3.3* 3.3*  CL 114* 116*  CO2 20* 20*  GLUCOSE 204* 116*  BUN 49* 49*  CREATININE 2.56* 2.52*  CALCIUM 7.8* 7.7*   GFR: Estimated Creatinine Clearance: 29.9 mL/min (A) (by C-G formula based on SCr of 2.52 mg/dL (H)). Liver Function Tests: Recent Labs  Lab 11/15/18 1627  AST 17  ALT 13  ALKPHOS 96  BILITOT 0.7  PROT 5.6*  ALBUMIN 2.9*   No results for input(s): LIPASE, AMYLASE in the last 168 hours. No results for input(s): AMMONIA in the last 168 hours. Coagulation Profile: No results for input(s): INR, PROTIME in the last 168 hours. Cardiac Enzymes: No results for input(s): CKTOTAL, CKMB, CKMBINDEX, TROPONINI in the last 168 hours. BNP (last 3 results) No results for input(s): PROBNP in the last 8760 hours. HbA1C: No results for input(s): HGBA1C in the last 72 hours. CBG: Recent Labs  Lab 11/16/18 0227 11/16/18 0817  GLUCAP 116* 103*   Lipid Profile: No results for  input(s): CHOL, HDL, LDLCALC, TRIG, CHOLHDL, LDLDIRECT in the last 72 hours. Thyroid Function Tests: No results for input(s): TSH, T4TOTAL, FREET4, T3FREE, THYROIDAB in the last 72 hours. Anemia Panel: No results for input(s): VITAMINB12, FOLATE, FERRITIN, TIBC, IRON, RETICCTPCT in the last 72 hours. Urine analysis:    Component Value Date/Time   COLORURINE YELLOW 06/24/2018 0628   APPEARANCEUR CLOUDY (A) 06/24/2018 0628   LABSPEC 1.012 06/24/2018 0628   PHURINE 7.0 06/24/2018 0628   GLUCOSEU NEGATIVE 06/24/2018 0628   HGBUR NEGATIVE 06/24/2018 0628   BILIRUBINUR NEGATIVE 06/24/2018 0628   BILIRUBINUR NEG 06/26/2015 1540   KETONESUR  NEGATIVE 06/24/2018 0628   PROTEINUR 100 (A) 06/24/2018 0628   UROBILINOGEN 1.0 06/26/2015 1540   UROBILINOGEN 0.2 2020-08-2013 2156   NITRITE NEGATIVE 06/24/2018 0628   LEUKOCYTESUR LARGE (A) 06/24/2018 1410     Gary Gabrielsen M.D. [facility] 11/16/2018, 12:57 PM  Pager: [phone] Between 7am to 7pm - call Pager - [phone]  After 7pm go to [email or web address] - password [email or web address]  Call night coverage person covering after 7pm

## 2018-11-16 NOTE — Consult Note (Addendum)
Consultation  Referring Provider:     Orene Desanctis, DO Primary Care Physician:  Kathrene Alu, MD Primary Gastroenterologist:        Justice Britain, MD Reason for Consultation:     Anemia, gastric Adenocarcinoma         HPI:   Leonard [name] is a 68 y.o. 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  He was recently hospitalized 9/28-10/4 with symptomatic anemia, with hemoglobin nadir 4.7.  He was treated with 4 unit PRBC transfusion.  EGD on that admission on 9/29 notable for malignancy in antrum/pylorus with active bleeding, not amenable to endoscopic intervention.  General surgery consulted on that admission, and ultimately was felt he was neither a surgical or chemotherapy candidate due to comorbidities and high risk.  Discharge hemoglobin 8.3 on 10/4.  He went for routine CBC check yesterday which was 5.8, and was told to go to the ER.  He is otherwise without melena, hematochezia, abdominal pain, nausea, vomiting, chest pain, SOB.  Admission hemoglobin 5.4, with PLT 95.  Was transfused 2 unit PRBCs overnight, started on IV PPI.  General surgery was again consulted, and the patient does not want to proceed with surgery due to high risks, and recommended Palliative Care consultation.  No family present at bedside today.  Past Medical History:  Diagnosis Date  . Acute on chronic diastolic heart failure ([place]) 07/04/2014  . Anemia of renal disease   . Atherosclerotic peripheral vascular disease with gangrene ([place]) 02/14/2017  . CAD (coronary artery disease) 04/06/2013  . Cancer ([place])    skin cancer-  . CHF (congestive heart failure) ([place])   . Chronic anemia 02/28/2016  . Chronic deep vein thrombosis (DVT) of femoral vein of left lower extremity ([place]) 09/03/2016  . Chronic respiratory failure  with hypoxia ([place]) 08/25/2017  . CKD (chronic kidney disease) stage 4, GFR 15-29 ml/min (HCC) 01/15/2015   Follows with [facility].  Last visits:  07/23/16: No change in meds. Worried that if we increase diuretics could lead to another AKI  . CKD (chronic kidney disease), stage IV ([place])    07/07/2017  . Claudication ([place])   . Collapse of left talus 01/14/2017  . COPD GOLD III  02/16/2014   Quit smoking 2014  - PFT's  04/06/13   FEV1 1.28 (44 % ) ratio 48  p no % improvement from saba p ?  prior to study with DLCO  43/54 % corrects to 97  % for alv volume - 01/31/2016  After extensive coaching HFA effectiveness =    75% from baseline 0 > changed to sprivia respimat   - PFT's  03/17/2016  FEV1 2.25  (81 % ) ratio 88  p 2 % improvement from saba p spiriva respimat x 2  prior to study with  . Coronary artery disease   . Diabetes ([place])   . Diabetic polyneuropathy associated with type 2 diabetes mellitus ([place]) 01/14/2017  . Diabetic retinopathy ([place]) 11/13/2015   Per Dr. Erenest Rasher eye exam in 10/17/15, mild background diabetic retinopathy  . Fatigue associated with anemia 01/21/2016  . Gangrene of toe ([place])    right 5th/notes 01/04/2013   . HCAP (healthcare-associated pneumonia)   . Hematuria 06/27/2015  . High cholesterol   . History of tobacco use 08/23/2013   Smoked pack per day for 46 years  Quit smoking - 02/03/2013   . Hypertension   . Hypothyroidism 11/22/2014  . Idiopathic chronic gout of left foot without tophus   . Iron deficiency anemia 08/03/2015  . Left foot pain 10/29/2016  . Long term (current) use of anticoagulants [Z79.01] 09/09/2016  . MGUS (monoclonal gammopathy of unknown significance) 01/15/2015  . Mixed hyperlipidemia 07/17/2008  . Morbid obesity due to excess calories ([place]) 03/17/2016  . PAD (peripheral artery disease) ([place])   . Pleural effusion   . Pneumonitis 07/26/2017  . Pulmonary artery hypertension ([place]) 07/25/2017  . PVD - hx of Rt SFA PTA and s/p Rt 4-5th toe amp Dec 2014  04/05/2013  . 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 Respiratory failure ([place]) 08/13/2017  . Respiratory failure with hypoxia ([place]) 09/23/2017  . 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 Secondary hypertension   . Shortness of breath dyspnea    with exertion  . Skin lesion of face 02/22/2015  . SOB (shortness of breath)   . Type 2 diabetes mellitus with pressure callus ([place]) 12/29/2016  . Type II diabetes mellitus (HCC)    Type II  . Type II diabetes mellitus with complication [facility])     Past Surgical History:  Procedure Laterality Date  . ABDOMINAL AORTOGRAM W/LOWER EXTREMITY N/A 03/12/2017   Procedure: ABDOMINAL AORTOGRAM W/LOWER EXTREMITY;  Surgeon: Conrad Florence, MD;  Location: [place] CV LAB;  Service: Cardiovascular;  Laterality: N/A;  . AMPUTATION Right 01/28/2013   Procedure: RAY AMPUTATION RIGHT  4th & 5th TOE;  Surgeon: Rosetta Posner, MD;  Location: [place];  Service: Vascular;  Laterality: Right;  . AMPUTATION Left 03/15/2017   Procedure: AMPUTATION ABOVE KNEE;  Surgeon: Waynetta Sandy, MD;  Location: [place];  Service: Vascular;  Laterality: Left;  . ANGIOPLASTY / STENTING FEMORAL Right 01/13/2013   superficial femoral artery x 2 (3 mm x 60 mm, 4 mm x 60 mm)  . AV FISTULA PLACEMENT Left 02/11/2016   Procedure: LEFT UPPER ARM BRACHIAL CEPHALIC ARTERIOVENOUS (AV) FISTULA CREATION;  Surgeon: Conrad Bodfish, MD;  Location: [place];  Service: Vascular;  Laterality: Left;  . BIOPSY  03/29/2018   Procedure: BIOPSY;  Surgeon: Rush Landmark Telford Nab., MD;  Location: [place];  Service: Gastroenterology;;  . BIOPSY  05/31/2018   Procedure: BIOPSY;  Surgeon: Irving Copas., MD;  Location: [place];  Service: Gastroenterology;;  . COLONOSCOPY W/ POLYPECTOMY    . CORONARY ARTERY BYPASS GRAFT N/A 04/07/2013   Procedure: CORONARY ARTERY BYPASS GRAFTING (CABG);  Surgeon: Ivin Poot, MD;  Location: [place];  Service: Open Heart Surgery;  Laterality: N/A;  . ESOPHAGOGASTRODUODENOSCOPY (EGD)  WITH PROPOFOL N/A 03/29/2018   Procedure: ESOPHAGOGASTRODUODENOSCOPY (EGD) WITH PROPOFOL;  Surgeon: Rush Landmark Telford Nab., MD;  Location: [place];  Service: Gastroenterology;  Laterality: N/A;  . ESOPHAGOGASTRODUODENOSCOPY (EGD) WITH PROPOFOL N/A 05/31/2018   Procedure: ESOPHAGOGASTRODUODENOSCOPY (EGD) WITH PROPOFOL;  Surgeon: Rush Landmark Telford Nab., MD;  Location: [place];  Service: Gastroenterology;  Laterality: N/A;  . ESOPHAGOGASTRODUODENOSCOPY (EGD) WITH PROPOFOL N/A 11/02/2018   Procedure: ESOPHAGOGASTRODUODENOSCOPY (EGD) WITH PROPOFOL;  Surgeon: Jerene Bears, MD;  Location: [facility];  Service: Gastroenterology;  Laterality: N/A;  . EUS N/A 05/31/2018   Procedure: UPPER ENDOSCOPIC ULTRASOUND (EUS) RADIAL;  Surgeon: Rush Landmark Telford Nab., MD;  Location: [place];  Service: Gastroenterology;  Laterality: N/A;  . INTRAOPERATIVE TRANSESOPHAGEAL ECHOCARDIOGRAM N/A 04/07/2013   Procedure: INTRAOPERATIVE TRANSESOPHAGEAL ECHOCARDIOGRAM;  Surgeon: Ivin Poot, MD;  Location: [place];  Service: Open Heart Surgery;  Laterality: N/A;  . IR FLUORO GUIDE CV LINE RIGHT  11/04/2017  . IR REMOVAL TUN CV CATH W/O FL  11/27/2017  . IR US GUIDE VASC ACCESS RIGHT  11/04/2017  . LEFT HEART CATHETERIZATION WITH CORONARY ANGIOGRAM N/A 04/05/2013   Procedure: LEFT HEART CATHETERIZATION WITH CORONARY ANGIOGRAM;  Surgeon: Peter M Martinique, MD;  Location: [facility] CATH LAB;  Service: Cardiovascular;  Laterality: N/A;  . LOWER EXTREMITY ANGIOGRAM Bilateral 01/06/2013   Procedure: LOWER EXTREMITY ANGIOGRAM;  Surgeon: Conrad Hancocks Bridge, MD;  Location: [facility] CATH LAB;  Service: Cardiovascular;  Laterality: Bilateral;  . LOWER EXTREMITY ANGIOGRAM Right 01/13/2013   Procedure: LOWER EXTREMITY ANGIOGRAM;  Surgeon: Conrad Fredonia, MD;  Location: [facility] CATH LAB;  Service: Cardiovascular;  Laterality: Right;  . SKIN CANCER EXCISION  2017  . THROAT SURGERY  1983   polyps  . TONSILLECTOMY AND ADENOIDECTOMY  ~ 1965    Family  History  Problem Relation Age of Onset  . Cancer Mother        lung cancer  . Hypertension Mother   . Cancer Sister        patient thinks it was uterine cancer  . Hypertension Sister   . Heart attack Sister   . Stroke Neg Hx   . Colon cancer Neg Hx   . Esophageal cancer Neg Hx   . Inflammatory bowel disease Neg Hx   . Pancreatic cancer Neg Hx   . Liver disease Neg Hx   . Rectal cancer Neg Hx   . Stomach cancer Neg Hx      Social History   Tobacco Use  . Smoking status: Current Every Day Smoker    Packs/day: 1.00    Years: 46.00    Pack years: 46.00    Types: Cigarettes    Start date: 02/04/1967  . Smokeless tobacco: Never Used  Substance Use Topics  . Alcohol use: No    Alcohol/week: 0.0 standard drinks  . Drug use: No    Prior to Admission medications   Medication Sig Start Date End Date Taking? Authorizing Provider  acetaminophen (TYLENOL) 325 MG tablet Take 650 mg by mouth every 6 (six) hours as needed for fever.   Yes [provider]  amLODipine (NORVASC) 5 MG tablet Take 1 tablet (5 mg total) by mouth daily. 07/12/18  Yes Tammi Klippel, Sherin, DO  ascorbic acid (VITAMIN C) 500 MG tablet Take 500 mg by mouth daily.   Yes [provider]  atorvastatin (LIPITOR) 40 MG tablet TAKE ONE (1) TABLET BY MOUTH EVERY DAY AT 6:00PM Patient taking differently: Take 40 mg by mouth daily at 6 PM.  11/17/16  Yes Larey Dresser, MD  carvedilol (COREG) 12.5 MG tablet Take 1 tablet (12.5 mg total) by mouth 2 (two) times daily with a meal. 08/30/17  Yes Meccariello, Bernita Raisin, DO  cholecalciferol (VITAMIN D) 25 MCG (1000 UT) tablet Take 2,000 Units by mouth 3 (three) times daily.   Yes [provider]  cloNIDine (CATAPRES - DOSED IN MG/24 HR) 0.2 mg/24hr patch Place 1 patch (0.2 mg total) onto the skin once a week. 08/09/17  Yes Steve Rattler, DO  Darbepoetin Alfa (ARANESP) 40 MCG/0.4ML SOSY injection Inject 0.4 mLs (40 mcg total) into the skin every 30 (thirty) days.  09/28/17  Yes Meccariello, Bernita Raisin, DO  doxazosin (CARDURA) 8 MG tablet Take 1 tablet (8 mg total) by mouth every evening. Patient taking differently: Take 8 mg by mouth daily.  12/01/16  Yes Hensel,  Jamal Collin, MD  feeding supplement, ENSURE ENLIVE, (ENSURE ENLIVE) LIQD Take 237 mLs by mouth 3 (three) times daily between meals. 08/07/17  Yes Riccio, Angela C, DO  ferrous sulfate (FEROSUL) 325 (65 FE) MG tablet Take 1 tablet (325 mg total) by mouth daily at 12 noon. Patient taking differently: Take 325 mg by mouth daily.  08/31/17  Yes Meccariello, Bernita Raisin, DO  folic acid (FOLVITE) 1 MG tablet Take 1 tablet (1 mg total) by mouth daily. 11/06/18 11/06/19 Yes Mercy Riding, MD  Glycopyrrolate (LONHALA MAGNAIR STARTER KIT) 25 MCG/ML SOLN Inhale 1 mL into the lungs 2 (two) times a day.   Yes [provider]  hydrALAZINE (APRESOLINE) 100 MG tablet TAKE ONE TABLET BY MOUTH EVERY EIGHT HOURS Patient taking differently: Take 100 mg by mouth every 8 (eight) hours.  11/14/16  Yes [name], MD  insulin aspart (NOVOLOG) 100 UNIT/ML injection Inject 0-10 Units into the skin 3 (three) times daily before meals. <150= 0 units, 151-200= 2 units, 201-250= 4 units, 251-300= 6 units, 301-350= 8 units, 351-400= 10 units   Yes [provider]  isosorbide mononitrate (IMDUR) 120 MG 24 hr tablet Take 1 tablet (120 mg total) by mouth daily. 10/02/17  Yes Anderson, Chelsey L, DO  levothyroxine (SYNTHROID, LEVOTHROID) 100 MCG tablet Take 100 mcg by mouth daily before breakfast.    Yes [provider]  nitroGLYCERIN (NITROSTAT) 0.4 MG SL tablet Place 1 tablet (0.4 mg total) under the tongue every 5 (five) minutes as needed for chest pain. Patient taking differently: Place 0.4 mg under the tongue every 5 (five) minutes as needed for chest pain. Chest pain 11/14/16  Yes [name], MD  ondansetron (ZOFRAN) 4 MG tablet Take 4 mg by mouth every 8 (eight) hours as needed for nausea or  vomiting.   Yes [provider]  pantoprazole (PROTONIX) 40 MG tablet Take 1 tablet (40 mg total) by mouth 2 (two) times daily. 04/03/18  Yes Beard, Aldona Bar N, DO  polyethylene glycol (MIRALAX / GLYCOLAX) packet Take 17 g by mouth daily as needed for mild constipation or moderate constipation. 08/07/17  Yes Riccio, Angela C, DO  sucralfate (CARAFATE) 1 g tablet Take 1 tablet (1 g total) by mouth 4 (four) times daily -  with meals and at bedtime. 11/06/18  Yes Mercy Riding, MD  torsemide (DEMADEX) 20 MG tablet Take 2 tablets (40 mg total) by mouth daily. 02/16/18  Yes Larey Dresser, MD  umeclidinium bromide (INCRUSE ELLIPTA) 62.5 MCG/INH AEPB Inhale 1 puff into the lungs daily. 06/28/18  Yes Mullis, Kiersten P, DO  Blood Glucose Monitoring Suppl (ONETOUCH VERIO FLEX SYSTEM) w/Device KIT 1 kit by Does not apply route daily. 10/10/16   McDiarmid, Blane Ohara, MD  glucose blood (ONETOUCH VERIO) test strip Use as instructed to test three times daily. ICD-10 code: E11.22 10/15/16   McDiarmid, Blane Ohara, MD  [facility] DELICA LANCETS 74Q MISC 1 each by Does not apply route 3 (three) times daily. ICD-10 code: E11.22 Patient not taking: Reported on 07/15/2018 10/15/16   McDiarmid, Blane Ohara, MD    Current Facility-Administered Medications  Medication Dose Route Frequency Provider Last Rate Last Dose  . 0.9 %  sodium chloride infusion   Intravenous Continuous Ileene Musa T, DO   Stopped at 11/16/18 5956  . amLODipine (NORVASC) tablet 5 mg  5 mg Oral Daily Tu, Ching T, DO   5 mg at 11/16/18 1025  . atorvastatin (LIPITOR) tablet 40  mg  40 mg Oral q1800 Tu, Ching T, DO      . carvedilol (COREG) tablet 12.5 mg  12.5 mg Oral BID WC Tu, Ching T, DO   12.5 mg at 11/16/18 0821  . doxazosin (CARDURA) tablet 8 mg  8 mg Oral QPM Tu, Ching T, DO   8 mg at 11/15/18 2330  . ferrous sulfate tablet 325 mg  325 mg Oral Daily Tu, Ching T, DO   325 mg at 04/23/20 4825  . folic acid (FOLVITE) tablet 1 mg  1 mg Oral Daily Tu, Ching T, DO    1 mg at 11/16/18 1025  . hydrALAZINE (APRESOLINE) tablet 100 mg  100 mg Oral TID Tu, Ching T, DO   100 mg at 11/16/18 1025  . insulin aspart (novoLOG) injection 0-9 Units  0-9 Units Subcutaneous TID WC Tu, Ching T, DO      . isosorbide mononitrate (IMDUR) 24 hr tablet 120 mg  120 mg Oral Daily Tu, Ching T, DO   120 mg at 11/16/18 1025  . levothyroxine (SYNTHROID) tablet 100 mcg  100 mcg Oral Q0600 Tu, Royal Piedra T, DO   100 mcg at 11/16/18 0605  . pantoprazole (PROTONIX) injection 40 mg  40 mg Intravenous QHS Tu, Ching T, DO   40 mg at 11/16/18 0221  . torsemide (DEMADEX) tablet 40 mg  40 mg Oral Daily Tu, Ching T, DO   40 mg at 11/16/18 1026  . umeclidinium bromide (INCRUSE ELLIPTA) 62.5 MCG/INH 1 puff  1 puff Inhalation Daily Tu, Ching T, DO   1 puff at 11/16/18 0821   Current Outpatient Medications  Medication Sig Dispense Refill  . acetaminophen (TYLENOL) 325 MG tablet Take 650 mg by mouth every 6 (six) hours as needed for fever.    Marland Kitchen amLODipine (NORVASC) 5 MG tablet Take 1 tablet (5 mg total) by mouth daily.    Marland Kitchen ascorbic acid (VITAMIN C) 500 MG tablet Take 500 mg by mouth daily.    Marland Kitchen atorvastatin (LIPITOR) 40 MG tablet TAKE ONE (1) TABLET BY MOUTH EVERY DAY AT 6:00PM (Patient taking differently: Take 40 mg by mouth daily at 6 PM. ) 90 tablet 3  . carvedilol (COREG) 12.5 MG tablet Take 1 tablet (12.5 mg total) by mouth 2 (two) times daily with a meal. 60 tablet 0  . cholecalciferol (VITAMIN D) 25 MCG (1000 UT) tablet Take 2,000 Units by mouth 3 (three) times daily.    . cloNIDine (CATAPRES - DOSED IN MG/24 HR) 0.2 mg/24hr patch Place 1 patch (0.2 mg total) onto the skin once a week. 4 patch 0  . Darbepoetin Alfa (ARANESP) 40 MCG/0.4ML SOSY injection Inject 0.4 mLs (40 mcg total) into the skin every 30 (thirty) days. 8.4 mL 2  . doxazosin (CARDURA) 8 MG tablet Take 1 tablet (8 mg total) by mouth every evening. (Patient taking differently: Take 8 mg by mouth daily. )    . feeding supplement,  ENSURE ENLIVE, (ENSURE ENLIVE) LIQD Take 237 mLs by mouth 3 (three) times daily between meals. 237 mL 12  . ferrous sulfate (FEROSUL) 325 (65 FE) MG tablet Take 1 tablet (325 mg total) by mouth daily at 12 noon. (Patient taking differently: Take 325 mg by mouth daily. ) 30 tablet 0  . folic acid (FOLVITE) 1 MG tablet Take 1 tablet (1 mg total) by mouth daily. 90 tablet 0  . Glycopyrrolate (LONHALA MAGNAIR STARTER KIT) 25 MCG/ML SOLN Inhale 1 mL into the lungs 2 (two) times  a day.    . hydrALAZINE (APRESOLINE) 100 MG tablet TAKE ONE TABLET BY MOUTH EVERY EIGHT HOURS (Patient taking differently: Take 100 mg by mouth every 8 (eight) hours. ) 90 tablet 1  . insulin aspart (NOVOLOG) 100 UNIT/ML injection Inject 0-10 Units into the skin 3 (three) times daily before meals. <150= 0 units, 151-200= 2 units, 201-250= 4 units, 251-300= 6 units, 301-350= 8 units, 351-400= 10 units    . isosorbide mononitrate (IMDUR) 120 MG 24 hr tablet Take 1 tablet (120 mg total) by mouth daily. 30 tablet 0  . levothyroxine (SYNTHROID, LEVOTHROID) 100 MCG tablet Take 100 mcg by mouth daily before breakfast.     . nitroGLYCERIN (NITROSTAT) 0.4 MG SL tablet Place 1 tablet (0.4 mg total) under the tongue every 5 (five) minutes as needed for chest pain. (Patient taking differently: Place 0.4 mg under the tongue every 5 (five) minutes as needed for chest pain. Chest pain) 30 tablet 12  . ondansetron (ZOFRAN) 4 MG tablet Take 4 mg by mouth every 8 (eight) hours as needed for nausea or vomiting.    . pantoprazole (PROTONIX) 40 MG tablet Take 1 tablet (40 mg total) by mouth 2 (two) times daily.    . polyethylene glycol (MIRALAX / GLYCOLAX) packet Take 17 g by mouth daily as needed for mild constipation or moderate constipation. 14 each 0  . sucralfate (CARAFATE) 1 g tablet Take 1 tablet (1 g total) by mouth 4 (four) times daily -  with meals and at bedtime. 90 tablet 0  . torsemide (DEMADEX) 20 MG tablet Take 2 tablets (40 mg total) by  mouth daily. 120 tablet 11  . umeclidinium bromide (INCRUSE ELLIPTA) 62.5 MCG/INH AEPB Inhale 1 puff into the lungs daily. 30 each 0  . Blood Glucose Monitoring Suppl (ONETOUCH VERIO FLEX SYSTEM) w/Device KIT 1 kit by Does not apply route daily. 1 kit 3  . glucose blood (ONETOUCH VERIO) test strip Use as instructed to test three times daily. ICD-10 code: E11.22 100 each 12  . ONETOUCH DELICA LANCETS 46F MISC 1 each by Does not apply route 3 (three) times daily. ICD-10 code: E11.22 (Patient not taking: Reported on 07/15/2018) 100 each 12   Facility-Administered Medications Ordered in Other Encounters  Medication Dose Route Frequency Provider Last Rate Last Dose  . 0.9 %  sodium chloride infusion (Manually program via Guardrails IV Fluids)  250 mL Intravenous Once Hayden Pedro, PA-C      . sodium chloride flush (NS) 0.9 % injection 10 mL  10 mL Intracatheter PRN Heath Lark, MD        Allergies as of 11/15/2018 - Review Complete 11/15/2018  Allergen Reaction Noted  . Penicillins Other (See Comments) 08/13/2017     Review of Systems:    As per HPI, otherwise negative    Physical Exam:  Vital signs in last 24 hours: Temp:  [98.2 F (36.8 C)-99.3 F (37.4 C)] 98.4 F (36.9 C) (10/13 0215) Pulse Rate:  [59-84] 67 (10/13 1115) Resp:  [13-23] 19 (10/13 1115) BP: (128-192)/(48-86) 171/69 (10/13 1115) SpO2:  [90 %-100 %] 95 % (10/13 1115)   General:   Pleasant male in NAD Head:  Normocephalic and atraumatic. Eyes:   No icterus.   Conjunctiva pink. Ears:  Normal auditory acuity. Neck:  Supple Lungs:  Respirations even and unlabored. Lungs clear to auscultation bilaterally.   No wheezes, crackles, or rhonchi.  Heart:  Regular rate and rhythm; no MRG Abdomen:  Soft, nondistended, nontender.  Normal bowel sounds. No appreciable masses or hepatomegaly.  Rectal:  Not performed.  Neurologic:  Alert and  oriented x4;  grossly normal neurologically. Skin:  Intact without significant  lesions or rashes. Psych:  Alert and cooperative. Normal affect.  LAB RESULTS: Recent Labs    11/15/18 1627 11/15/18 1928 11/16/18 1039  WBC 3.5* 3.4* 3.8*  HGB 5.8* 5.4* 7.0*  HCT 18.8* 17.4* 22.2*  PLT 110* 95* 82*   BMET Recent Labs    11/15/18 1627 11/16/18 0448  NA 141 143  K 3.3* 3.3*  CL 114* 116*  CO2 20* 20*  GLUCOSE 204* 116*  BUN 49* 49*  CREATININE 2.56* 2.52*  CALCIUM 7.8* 7.7*   LFT Recent Labs    11/15/18 1627  PROT 5.6*  ALBUMIN 2.9*  AST 17  ALT 13  ALKPHOS 96  BILITOT 0.7   PT/INR No results for input(s): LABPROT, INR in the last 72 hours.  STUDIES: No results found.     Impression / Plan:   1) Gastric Adenocarcinoma 2) Anemia 3) CKD 4 4) CAD s/p CABG 5) COPD 6) CHF  Unfortunately, Mr. Hawkes presents again today with significant anemia, undoubtably due to ongoing bleeding from his gastric Adenocarcinoma.  An EGD during his last admission, notable for multiple sites of active oozing, not amenable to endoscopic intervention.  While there is the possibility of using hemospray endoscopically, this is for short-term relief of bleeding and as a bridge to a more durable modality, such as surgical resection.  However, given his elevated surgical risks, he does not want to proceed with this route.  Was additionally previously deemed not a candidate for chemotherapy, again owing to elevated risks.  -We discussed the role of repeat endoscopy today, and given the recent findings, he does not want to proceed with this repeat procedure given the low yield of therapeutic intervention, which is understandable. -Could consider Interventional Radiology consultation for consideration of GDA embolization. -Agree with General Surgery recommendation for Palliative Care consultation to discuss goals of care. -Resume IV PPI as already prescribed -Agree with blood products for goal hemoglobin >7-8 -IV iron would also be reasonable -Given our limited role, GI  service will follow peripherally only at this juncture.  However, please do not hesitate to contact the inpatient service for any additional questions or concerns  Thanks   LOS: 1 day   Lavena Bullion  11/16/2018, 12:10 PM

## 2018-11-17 DIAGNOSIS — Z515 Encounter for palliative care: Secondary | ICD-10-CM

## 2018-11-17 DIAGNOSIS — Z7189 Other specified counseling: Secondary | ICD-10-CM

## 2018-11-17 DIAGNOSIS — I10 Essential (primary) hypertension: Secondary | ICD-10-CM

## 2018-11-17 DIAGNOSIS — D62 Acute posthemorrhagic anemia: Secondary | ICD-10-CM

## 2018-11-17 LAB — CBC
HCT: 23.5 % — ABNORMAL LOW (ref 39.0–52.0)
Hemoglobin: 7.2 g/dL — ABNORMAL LOW (ref 13.0–17.0)
MCH: 28.6 pg (ref 26.0–34.0)
MCHC: 30.6 g/dL (ref 30.0–36.0)
MCV: 93.3 fL (ref 80.0–100.0)
Platelets: 81 10*3/uL — ABNORMAL LOW (ref 150–400)
RBC: 2.52 MIL/uL — ABNORMAL LOW (ref 4.22–5.81)
RDW: 15.1 % (ref 11.5–15.5)
WBC: 3.5 10*3/uL — ABNORMAL LOW (ref 4.0–10.5)
nRBC: 0 % (ref 0.0–0.2)

## 2018-11-17 LAB — BASIC METABOLIC PANEL
Anion gap: 7 (ref 5–15)
BUN: 51 mg/dL — ABNORMAL HIGH (ref 8–23)
CO2: 21 mmol/L — ABNORMAL LOW (ref 22–32)
Calcium: 7.7 mg/dL — ABNORMAL LOW (ref 8.9–10.3)
Chloride: 114 mmol/L — ABNORMAL HIGH (ref 98–111)
Creatinine, Ser: 2.38 mg/dL — ABNORMAL HIGH (ref 0.61–1.24)
GFR calc Af Amer: 31 mL/min — ABNORMAL LOW (ref >60)
GFR calc non Af Amer: 27 mL/min — ABNORMAL LOW (ref >60)
Glucose, Bld: 109 mg/dL — ABNORMAL HIGH (ref 70–99)
Potassium: 3.9 mmol/L (ref 3.5–5.1)
Sodium: 142 mmol/L (ref 135–145)

## 2018-11-17 LAB — GLUCOSE, CAPILLARY
Glucose-Capillary: 97 mg/dL (ref 70–99)
Glucose-Capillary: 124 mg/dL — ABNORMAL HIGH (ref 70–99)
Glucose-Capillary: 142 mg/dL — ABNORMAL HIGH (ref 70–99)
Glucose-Capillary: 139 mg/dL — ABNORMAL HIGH (ref 70–99)

## 2018-11-17 LAB — HEMOGLOBIN AND HEMATOCRIT, BLOOD
HCT: 22.8 % — ABNORMAL LOW (ref 39.0–52.0)
Hemoglobin: 7.1 g/dL — ABNORMAL LOW (ref 13.0–17.0)

## 2018-11-17 NOTE — Progress Notes (Signed)
Patient ID: Leonard [name], male   DOB: 02-19-1950, 68 y.o.   MRN: [id number]       Subjective: Sleeping, arouses when touched, denies abdominal pain, goes back to sleep  Objective: Vital signs in last 24 hours: Temp:  [97.6 F (36.4 C)-97.9 F (36.6 C)] 97.9 F (36.6 C) (10/14 0515) Pulse Rate:  [64-78] 67 (10/14 0515) Resp:  [13-25] 16 (10/14 0515) BP: (155-192)/(56-113) 178/64 (10/14 0515) SpO2:  [95 %-100 %] 99 % (10/14 0515) Weight:  [81 kg] 81 kg (10/13 1957) Last BM Date: 11/15/18  Intake/Output from previous day: 10/13 0701 - 10/14 0700 In: 316 [Blood:316] Out: 800 [Urine:800] Intake/Output this shift: No intake/output data recorded.  PE: Heart: regular Lungs: CTAB Abd: soft, NT, ND, +BS  Lab Results:  Recent Labs    11/16/18 1039 11/17/18 0314  WBC 3.8* 3.5*  HGB 7.0* 7.2*  7.1*  HCT 22.2* 23.5*  22.8*  PLT 82* 81*   BMET Recent Labs    11/16/18 0448 11/17/18 0314  NA 143 142  K 3.3* 3.9  CL 116* 114*  CO2 20* 21*  GLUCOSE 116* 109*  BUN 49* 51*  CREATININE 2.52* 2.38*  CALCIUM 7.7* 7.7*   PT/INR No results for input(s): LABPROT, INR in the last 72 hours. CMP     Component Value Date/Time   NA 142 11/17/2018 0314   NA 139 02/11/2017 1220   NA 140 08/16/2015 1554   K 3.9 11/17/2018 0314   K 3.8 08/16/2015 1554   CL 114 (H) 11/17/2018 0314   CO2 21 (L) 11/17/2018 0314   CO2 21 (L) 08/16/2015 1554   GLUCOSE 109 (H) 11/17/2018 0314   GLUCOSE 133 08/16/2015 1554   BUN 51 (H) 11/17/2018 0314   BUN 54 (H) 02/11/2017 1220   BUN 51.0 (H) 08/16/2015 1554   CREATININE 2.38 (H) 11/17/2018 0314   CREATININE 3.64 (H) 01/09/2016 1606   CREATININE 2.7 (H) 08/16/2015 1554   CALCIUM 7.7 (L) 11/17/2018 0314   CALCIUM 8.4 (L) 05/10/2018 1050   CALCIUM 8.9 08/16/2015 1554   PROT 5.6 (L) 11/15/2018 1627   PROT 6.6 01/16/2017 1339   PROT 7.0 08/16/2015 1554   ALBUMIN 2.9 (L) 11/15/2018 1627   ALBUMIN 3.4 (L) 08/16/2015 1554   AST 17 11/15/2018  1627   AST 17 08/16/2015 1554   ALT 13 11/15/2018 1627   ALT 13 08/16/2015 1554   ALKPHOS 96 11/15/2018 1627   ALKPHOS 89 08/16/2015 1554   BILITOT 0.7 11/15/2018 1627   BILITOT 0.30 08/16/2015 1554   GFRNONAA 27 (L) 11/17/2018 0314   GFRNONAA 27 (L) 07/20/2015 0949   GFRAA 31 (L) 11/17/2018 0314   GFRAA 31 (L) 07/20/2015 0949   Lipase     Component Value Date/Time   LIPASE 45 07/15/2018 1455       Studies/Results: No results found.  Anti-infectives: Anti-infectives (From admission, onward)   None       Assessment/Plan COPD Gold Stage III Pulm HTN CHF CKD IV DM2 HTN CAD s/p CABG 2015 PVD s/p left BKA Hypothyroidism  Pancytopenia  Abdominal Aneurysm on CT Acute on Chronic Anemia - Hgb 5.4 on admission. S/p 2U blood. CBC pending. 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 yesterday to discuss surgery vs palliative care discussions.  -await palliative discussions -  no immediate surgical intervention planned -will follow  FEN - heart healthy diet VTE - On hold for anemia  ID - None    LOS: 2 days    Henreitta Cea , [facility] 11/17/2018, 8:30 AM Please see Amion for pager number during day hours 7:00am-4:30pm

## 2018-11-17 NOTE — Consult Note (Signed)
Consultation Note Date: 11/17/2018  [name]  DOB: [birth year]  MRN: [id number]  Age / Sex: 68 y.o., male  PCP: Kathrene Alu, MD Referring Physician: Mariel Aloe, MD  Reason for Consultation: Establishing goals of care  HPI/Patient Profile: 68 y.o. male  with past medical history of diastolic CHF, gastric adenocarcinoma s/p XRT, hypertension, COPD, PVD s/p left AKA, CKD stage IV, DM type 2, GI bleeding admitted on 11/15/2018 with abnormal labs, HgB 5.8. Patient received 3 blood transfusions. Acute on chronic blood loss anemia secondary to gastric adenocarcinoma. Evaluated by GI, IR and general surgery. Patient is a poor surgical candidate and not a candidate for chemotherapy. Consultants recommending palliative medicine since he is high risk for GI/IR interventions and will not offer a permanent solution. Palliative medicine consultation for goals of care.   Clinical Assessment and Goals of Care:  I have reviewed medical records, discussed with care team and met with patient, sister [name]) and niece Karie Kirks) at bedside to discuss diagnosis, prognosis, GOC, EOL wishes, disposition and options.  Introduced Palliative Medicine as specialized medical care for people living with serious illness. It focuses on providing relief from the symptoms and stress of a serious illness. The goal is to improve quality of life for both the patient and the family.  Prior to admission, patient living at [facility]. He has lived there for 1.5 years since left leg amputation. Baseline, he is wheelchair bound. Appetite fair. Requires assist with ADL's.  Diagnosed with gastric cancer this year and has completed radiation. Patient and family met with [name] on Monday--for which they were explained that he is a very high risk surgical candidate and recommending palliative approach.   Discussed  course of hospitalization including diagnoses, interventions, plan of care. Explained poor candidacy for surgery and not a candidate for chemotherapy. Reviewed IR and GI recommendations against interventions that are also risky (bleeding/nephrotoxicity) but also will unfortunately not offer a permanent solution to his cancer. Frankly and compassionately discussed poor prognosis. Reviewed consultant notes in detail with patient and family.   Patient shares his wishes to continue blood transfusions to prolong his life. He does verbalize understanding that this is not "curable" but would like to "live as long as I can" with blood transfusions.   Sister and niece ask if he is a candidate for any further radiation. Explained that I would discuss with attending.   Advanced directives, concepts specific to code status, artifical feeding and hydration, and rehospitalization were considered and discussed. Patient has a documented HCPOA with Rosalyn (primary HCPOA) and [name] (secondary HCPOA). Introduced and discussed MOST form. Educated on medical recommendation against heroic measures including DNR/DNI and feeding tube, with underlying terminal cancer, frailty, and fear that this will only cause pain and suffering at the end of his life.   Patient is not ready to make decisions regarding code status today. [name] shares that he needs more time to 'process' his conversation with [name]   Introduced hospice philosophy and options, but  also explaining that he is not currently eligible for hospice services with desire to continue blood transfusions. Explained role of comfort, quality, dignity, and symptom management at EOL.   Questions and concerns were addressed. PMT contact information given.     SUMMARY OF RECOMMENDATIONS    Patient wishes to continue blood transfusions to prolong life. He does verbalize understanding that his condition is not curable. He does understand he is a poor candidate for surgery  and not a candidate for chemotherapy.   Patient/family asking if he is a candidate for any further radiation. Discussed with attending who will f/u with consultants.  Continue FULL code. Introduced and discussed MOST form. Educated on recommendation for DNR/DNI with underlying incurable cancer. Patient not ready to make decisions today regarding code status.  Introduced hospice philosophy and options but did explain that patient is not eligible for hospice services with desire to continue blood transfusions.   Outpatient palliative referral.   Code Status/Advance Care Planning:  Full code  Symptom Management:   Per attending  Palliative Prophylaxis:   Bowel Regimen, Delirium Protocol and Frequent Pain Assessment  Additional Recommendations (Limitations, Scope, Preferences):  Full Scope Treatment  Psycho-social/Spiritual:   Desire for further Chaplaincy support: yes  Additional Recommendations: Caregiving  Support/Resources, Compassionate Wean Education and Education on Hospice  Prognosis:   Poor prognosis  Discharge Planning: To Be Determined      Primary Diagnoses: Present on Admission: . Acute anemia . GI bleed   I have reviewed the medical record, interviewed the patient and family, and examined the patient. The following aspects are pertinent.  Past Medical History:  Diagnosis Date  . Acute on chronic diastolic heart failure ([place]) 07/04/2014  . Anemia of renal disease   . Atherosclerotic peripheral vascular disease with gangrene ([place]) 02/14/2017  . CAD (coronary artery disease) 04/06/2013  . Cancer ([place])    skin cancer-  . CHF (congestive heart failure) ([place])   . Chronic anemia 02/28/2016  . 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 Chronic respiratory failure with hypoxia ([place]) 08/25/2017  . CKD (chronic kidney disease) stage 4, GFR 15-29 ml/min (HCC) 01/15/2015   Follows with [facility].  Last visits:   07/23/16: No change in meds. Worried that if we increase diuretics could lead to another AKI  . CKD (chronic kidney disease), stage IV ([place])    07/07/2017  . Claudication ([place])   . Collapse of left talus 01/14/2017  . COPD GOLD III  02/16/2014   Quit smoking 2014  - PFT's  04/06/13   FEV1 1.28 (44 % ) ratio 48  p no % improvement from saba p ?  prior to study with DLCO  43/54 % corrects to 97  % for alv volume - 01/31/2016  After extensive coaching HFA effectiveness =    75% from baseline 0 > changed to sprivia respimat   - PFT's  03/17/2016  FEV1 2.25  (81 % ) ratio 88  p 2 % improvement from saba p spiriva respimat x 2  prior to study with  . Coronary artery disease   . Diabetes ([place])   . Diabetic polyneuropathy associated with type 2 diabetes mellitus ([place]) 01/14/2017  . Diabetic retinopathy ([place]) 11/13/2015   Per Dr. Erenest Rasher eye exam in 10/17/15, mild background diabetic retinopathy  . Fatigue associated with anemia 01/21/2016  . Gangrene of toe ([place])    right 5th/notes 01/04/2013   . HCAP (healthcare-associated pneumonia)   .  Hematuria 06/27/2015  . High cholesterol   . History of tobacco use 08/23/2013   Smoked pack per day for 46 years Quit smoking - 02/03/2013   . Hypertension   . Hypothyroidism 11/22/2014  . Idiopathic chronic gout of left foot without tophus   . Iron deficiency anemia 08/03/2015  . Left foot pain 10/29/2016  . Long term (current) use of anticoagulants [Z79.01] 09/09/2016  . MGUS (monoclonal gammopathy of unknown significance) 01/15/2015  . Mixed hyperlipidemia 07/17/2008  . Morbid obesity due to excess calories ([place]) 03/17/2016  . PAD (peripheral artery disease) ([place])   . Pleural effusion   . Pneumonitis 07/26/2017  . Pulmonary artery hypertension ([place]) 07/25/2017  . PVD - hx of Rt SFA PTA and s/p Rt 4-5th toe amp Dec 2014 04/05/2013  . 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 Respiratory failure ([place]) 08/13/2017  . Respiratory failure with hypoxia ([place]) 09/23/2017  . S/P CABG x 3  04/07/2013  . Secondary hypertension   . Shortness of breath dyspnea    with exertion  . Skin lesion of face 02/22/2015  . SOB (shortness of breath)   . Type 2 diabetes mellitus with pressure callus ([place]) 12/29/2016  . Type II diabetes mellitus (HCC)    Type II  . Type II diabetes mellitus with complication [facility])    Social History   Socioeconomic History  . Marital status: Single    Spouse name: Not on file  . Number of children: 0  . Years of education: Not on file  . Highest education level: Not on file  Occupational History  . Occupation: disabled  Social Needs  . Financial resource strain: Not on file  . Food insecurity    Worry: Not on file    Inability: Not on file  . Transportation needs    Medical: No    Non-medical: No  Tobacco Use  . Smoking status: Current Every Day Smoker    Packs/day: 1.00    Years: 46.00    Pack years: 46.00    Types: Cigarettes    Start date: 02/04/1967  . Smokeless tobacco: Never Used  Substance and Sexual Activity  . Alcohol use: No    Alcohol/week: 0.0 standard drinks  . Drug use: No  . Sexual activity: Not Currently  Lifestyle  . Physical activity    Days per week: Not on file    Minutes per session: Not on file  . Stress: Not on file  Relationships  . Social Herbalist on phone: Patient refused    Gets together: Patient refused    Attends religious service: Patient refused    Active member of club or organization: Patient refused    Attends meetings of clubs or organizations: Patient refused    Relationship status: Patient refused  Other Topics Concern  . Not on file  Social History Narrative   Retired - former Sports coach.   Patient in skilled nursing facility.   Family History  Problem Relation Age of Onset  . Cancer Mother        lung cancer  . Hypertension Mother   . Cancer Sister        patient thinks it was uterine cancer  . Hypertension Sister   . Heart attack Sister   . Stroke Neg Hx   .  Colon cancer Neg Hx   . Esophageal cancer Neg Hx   . Inflammatory bowel disease Neg Hx   . Pancreatic cancer Neg Hx   .  Liver disease Neg Hx   . Rectal cancer Neg Hx   . Stomach cancer Neg Hx    Scheduled Meds: . amLODipine  5 mg Oral Daily  . atorvastatin  40 mg Oral q1800  . carvedilol  12.5 mg Oral BID WC  . doxazosin  8 mg Oral QPM  . ferrous sulfate  325 mg Oral Daily  . folic acid  1 mg Oral Daily  . hydrALAZINE  100 mg Oral TID  . insulin aspart  0-9 Units Subcutaneous TID WC  . isosorbide mononitrate  120 mg Oral Daily  . levothyroxine  100 mcg Oral Q0600  . mouth rinse  15 mL Mouth Rinse BID  . pantoprazole (PROTONIX) IV  40 mg Intravenous QHS  . torsemide  40 mg Oral Daily  . umeclidinium bromide  1 puff Inhalation Daily   Continuous Infusions: PRN Meds:. Medications Prior to Admission:  Prior to Admission medications   Medication Sig Start Date End Date Taking? Authorizing Provider  acetaminophen (TYLENOL) 325 MG tablet Take 650 mg by mouth every 6 (six) hours as needed for fever.   Yes [provider]  amLODipine (NORVASC) 5 MG tablet Take 1 tablet (5 mg total) by mouth daily. 07/12/18  Yes Tammi Klippel, Sherin, DO  ascorbic acid (VITAMIN C) 500 MG tablet Take 500 mg by mouth daily.   Yes [provider]  atorvastatin (LIPITOR) 40 MG tablet TAKE ONE (1) TABLET BY MOUTH EVERY DAY AT 6:00PM Patient taking differently: Take 40 mg by mouth daily at 6 PM.  11/17/16  Yes Larey Dresser, MD  carvedilol (COREG) 12.5 MG tablet Take 1 tablet (12.5 mg total) by mouth 2 (two) times daily with a meal. 08/30/17  Yes Meccariello, Bernita Raisin, DO  cholecalciferol (VITAMIN D) 25 MCG (1000 UT) tablet Take 2,000 Units by mouth 3 (three) times daily.   Yes [provider]  cloNIDine (CATAPRES - DOSED IN MG/24 HR) 0.2 mg/24hr patch Place 1 patch (0.2 mg total) onto the skin once a week. 08/09/17  Yes Steve Rattler, DO  Darbepoetin Alfa (ARANESP) 40 MCG/0.4ML SOSY  injection Inject 0.4 mLs (40 mcg total) into the skin every 30 (thirty) days. 09/28/17  Yes Meccariello, Bernita Raisin, DO  doxazosin (CARDURA) 8 MG tablet Take 1 tablet (8 mg total) by mouth every evening. Patient taking differently: Take 8 mg by mouth daily.  12/01/16  Yes Hensel, Jamal Collin, MD  feeding supplement, ENSURE ENLIVE, (ENSURE ENLIVE) LIQD Take 237 mLs by mouth 3 (three) times daily between meals. 08/07/17  Yes Riccio, Angela C, DO  ferrous sulfate (FEROSUL) 325 (65 FE) MG tablet Take 1 tablet (325 mg total) by mouth daily at 12 noon. Patient taking differently: Take 325 mg by mouth daily.  08/31/17  Yes Meccariello, Bernita Raisin, DO  folic acid (FOLVITE) 1 MG tablet Take 1 tablet (1 mg total) by mouth daily. 11/06/18 11/06/19 Yes Mercy Riding, MD  Glycopyrrolate (LONHALA MAGNAIR STARTER KIT) 25 MCG/ML SOLN Inhale 1 mL into the lungs 2 (two) times a day.   Yes [provider]  hydrALAZINE (APRESOLINE) 100 MG tablet TAKE ONE TABLET BY MOUTH EVERY EIGHT HOURS Patient taking differently: Take 100 mg by mouth every 8 (eight) hours.  11/14/16  Yes [name], MD  insulin aspart (NOVOLOG) 100 UNIT/ML injection Inject 0-10 Units into the skin 3 (three) times daily before meals. <150= 0 units, 151-200= 2 units, 201-250= 4 units, 251-300= 6 units, 301-350= 8 units, 351-400= 10  units   Yes [provider]  isosorbide mononitrate (IMDUR) 120 MG 24 hr tablet Take 1 tablet (120 mg total) by mouth daily. 10/02/17  Yes Anderson, Chelsey L, DO  levothyroxine (SYNTHROID, LEVOTHROID) 100 MCG tablet Take 100 mcg by mouth daily before breakfast.    Yes [provider]  nitroGLYCERIN (NITROSTAT) 0.4 MG SL tablet Place 1 tablet (0.4 mg total) under the tongue every 5 (five) minutes as needed for chest pain. Patient taking differently: Place 0.4 mg under the tongue every 5 (five) minutes as needed for chest pain. Chest pain 11/14/16  Yes [name], MD  ondansetron (ZOFRAN) 4 MG  tablet Take 4 mg by mouth every 8 (eight) hours as needed for nausea or vomiting.   Yes [provider]  pantoprazole (PROTONIX) 40 MG tablet Take 1 tablet (40 mg total) by mouth 2 (two) times daily. 04/03/18  Yes Beard, Aldona Bar N, DO  polyethylene glycol (MIRALAX / GLYCOLAX) packet Take 17 g by mouth daily as needed for mild constipation or moderate constipation. 08/07/17  Yes Riccio, Angela C, DO  sucralfate (CARAFATE) 1 g tablet Take 1 tablet (1 g total) by mouth 4 (four) times daily -  with meals and at bedtime. 11/06/18  Yes Mercy Riding, MD  torsemide (DEMADEX) 20 MG tablet Take 2 tablets (40 mg total) by mouth daily. 02/16/18  Yes Larey Dresser, MD  umeclidinium bromide (INCRUSE ELLIPTA) 62.5 MCG/INH AEPB Inhale 1 puff into the lungs daily. 06/28/18  Yes Mullis, Kiersten P, DO  Blood Glucose Monitoring Suppl (ONETOUCH VERIO FLEX SYSTEM) w/Device KIT 1 kit by Does not apply route daily. 10/10/16   McDiarmid, Blane Ohara, MD  glucose blood (ONETOUCH VERIO) test strip Use as instructed to test three times daily. ICD-10 code: E11.22 10/15/16   McDiarmid, Blane Ohara, MD  [facility] DELICA LANCETS 51V MISC 1 each by Does not apply route 3 (three) times daily. ICD-10 code: E11.22 Patient not taking: Reported on 07/15/2018 10/15/16   McDiarmid, Blane Ohara, MD   Allergies  Allergen Reactions  . Penicillins Other (See Comments)    Has patient had a PCN reaction causing immediate rash, facial/tongue/throat swelling, SOB or lightheadedness with hypotension: Unknown Has patient had a PCN reaction causing severe rash involving mucus membranes or skin necrosis: Unknown Has patient had a PCN reaction that required hospitalization: Unknown Has patient had a PCN reaction occurring within the last 10 years: Unknown If all of the above answers are "NO", then may proceed with Cephalosporin use.    Review of Systems  Constitutional: Positive for fatigue.   Physical Exam Vitals signs and nursing note reviewed.   Constitutional:      General: He is awake.  HENT:     Head: Normocephalic and atraumatic.  Pulmonary:     Effort: No tachypnea, accessory muscle usage or respiratory distress.  Abdominal:     Tenderness: There is no abdominal tenderness.  Musculoskeletal:     Comments: Left AKA  Skin:    General: Skin is warm and dry.  Neurological:     Mental Status: He is alert and oriented to person, place, and time.  Psychiatric:        Attention and Perception: Attention normal.        Mood and Affect: Mood normal.        Speech: Speech normal.        Cognition and Memory: Cognition normal.    Vital Signs: BP (!) 157/67 (BP Location: Right Arm)  Pulse 71   Temp 98.4 F (36.9 C) (Oral)   Resp 18   Ht _0  (1.727 m)   Wt 81 kg   SpO2 92%   BMI 27.16 kg/m  Pain Scale: 0-10   Pain Score: 0-No pain   SpO2: SpO2: 92 % O2 Device:SpO2: 92 % O2 Flow Rate: .O2 Flow Rate (L/min): 1 L/min  IO: Intake/output summary:   Intake/Output Summary (Last 24 hours) at 11/17/2018 2215 Last data filed at 11/17/2018 1800 Gross per 24 hour  Intake 1036 ml  Output 1550 ml  Net -514 ml    LBM: Last BM Date: 11/16/18 Baseline Weight: Weight: 81 kg Most recent weight: Weight: 81 kg     Palliative Assessment/Data: PPS 30%   Flowsheet Rows     Most Recent Value  Intake Tab  Referral Department  Hospitalist  Unit at Time of Referral  Med/Surg Unit  Palliative Care Primary Diagnosis  Cancer  Palliative Care Type  Return patient Palliative Care  Reason for referral  Clarify Goals of Care  Date first seen by Palliative Care  11/17/18  Clinical Assessment  Palliative Performance Scale Score  30%  Psychosocial & Spiritual Assessment  Palliative Care Outcomes  Patient/Family meeting held?  Yes  Who was at the meeting?  patient, niece, sister  Palliative Care Outcomes  Clarified goals of care, Counseled regarding hospice, Provided end of life care assistance, Provided psychosocial or spiritual  support, Linked to palliative care logitudinal support, ACP counseling assistance      Time In/Out: 0945-1000, 1530-1700 Time Total: 131mn Greater than 50%  of this time was spent counseling and coordinating care related to the above assessment and plan.  Signed by:  MIhor Dow DNP, FNP-C Palliative Medicine Team  Phone: [phone]: [phone]  Please contact Palliative Medicine Team phone at [phone] questions and concerns.  For individual provider: See AShea Evans

## 2018-11-17 NOTE — Plan of Care (Signed)
  Problem: Clinical Measurements: Goal: Ability to maintain clinical measurements within normal limits will improve Outcome: Progressing Goal: Will remain free from infection Outcome: Progressing Goal: Diagnostic test results will improve Outcome: Progressing Goal: Respiratory complications will improve Outcome: Completed/Met Goal: Cardiovascular complication will be avoided Outcome: Progressing   Problem: Activity: Goal: Risk for activity intolerance will decrease Outcome: Progressing   Problem: Nutrition: Goal: Adequate nutrition will be maintained Outcome: Progressing   Problem: Coping: Goal: Level of anxiety will decrease Outcome: Progressing   Problem: Elimination: Goal: Will not experience complications related to bowel motility Outcome: Progressing Goal: Will not experience complications related to urinary retention Outcome: Progressing   Problem: Pain Managment: Goal: General experience of comfort will improve Outcome: Progressing   Problem: Safety: Goal: Ability to remain free from injury will improve Outcome: Progressing   Problem: Skin Integrity: Goal: Risk for impaired skin integrity will decrease Outcome: Progressing

## 2018-11-17 NOTE — Progress Notes (Signed)
PMT consult received and chart reviewed. PMT to meet with patient, sister, and niece this afternoon around 3:30-4pm. Thank you.  NO CHARGE  Ihor Dow, [place], FNP-C Palliative Medicine Team  Phone: [phone] Fax: [phone]

## 2018-11-17 NOTE — Progress Notes (Signed)
PROGRESS NOTE    Leonard [name]  QPY:[id number] DOB: [birth year] DOA: 11/15/2018 PCP: Kathrene Alu, MD   Brief Narrative: Leonard [name] is a 68 y.o. 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 Patient evaluated by GI, IR and general surgery with recommendations for palliative care discussions. Complicated by thrombocytopenia. Patient with a hemoglobin of 5.4-5.8 and is s/p 3 units of PRBC. Current hemoglobin stable at 7.2. Continued dark stools concerning for melena. -Palliative care recommendations  Pancytopenia In setting of malignancy. Currently stable.  CKD stage IV Stable  Diabetes mellitus, type 2 -Continue SSI  Essential hypertension -Continue Imdur daily, torsemide daily, Carvedilol BID, amlodipine daily, hydralazine prn  History of CAD -Holding aspirin  PVD S/p left AKA Noted.   DVT prophylaxis: SCDs Code Status:   Code Status: Full Code Family Communication: None at bedside Disposition Plan: Discharge pending specialist recommendations   Consultants:   General surgery  Gastroenterology  Palliative care medicine  Interventional radiology  Procedures:   10/12: 2 units PRBC  10/13: 1 unit PRBC  Antimicrobials:  None    Subjective: No concerns. Had a BM this morning that was dark.  Objective: Vitals:   11/16/18 2217 11/16/18 2238 11/17/18 0104 11/17/18 0515  BP: (!) 180/73 (!) 161/74 (!) 183/83 (!) 178/64  Pulse: 67 64 71 67  Resp: 18 14 16 16   Temp: 97.8 F (36.6 C) 97.6 F (36.4 C) 97.6 F (36.4 C) 97.9 F (36.6 C)  TempSrc: Oral Oral Oral Oral  SpO2: 100% 100% 99% 99%  Weight:      Height:        Intake/Output Summary (Last 24  hours) at 11/17/2018 1250 Last data filed at 11/17/2018 1000 Gross per 24 hour  Intake 556 ml  Output 800 ml  Net -244 ml   Filed Weights   11/16/18 1957  Weight: 81 kg    Examination:  General exam: Appears calm and comfortable  Respiratory system: Clear to auscultation. Respiratory effort normal. Cardiovascular system: S1 & S2 heard, RRR. No murmurs, rubs, gallops or clicks. Gastrointestinal system: Abdomen is soft and nontender. Normal bowel sounds heard. Central nervous system: Alert and oriented. No focal neurological deficits. Extremities: No edema. Skin: No cyanosis. No rashes Psychiatry: Judgement and insight appear normal. Mood & affect appropriate.     Data Reviewed: I have personally reviewed following labs and imaging studies  CBC: Recent Labs  Lab 11/15/18 1627 11/15/18 1928 11/16/18 1039 11/17/18 0314  WBC 3.5* 3.4* 3.8* 3.5*  HGB 5.8* 5.4* 7.0* 7.2*  7.1*  HCT 18.8* 17.4* 22.2* 23.5*  22.8*  MCV 95.4 95.6 93.7 93.3  PLT 110* 95* 82* 81*   Basic Metabolic Panel: Recent Labs  Lab 11/15/18 1627 11/16/18 0448 11/17/18 0314  NA 141 143 142  K 3.3* 3.3* 3.9  CL 114* 116* 114*  CO2 20* 20* 21*  GLUCOSE 204* 116* 109*  BUN 49* 49* 51*  CREATININE 2.56* 2.52* 2.38*  CALCIUM 7.8* 7.7* 7.7*   GFR: Estimated Creatinine Clearance: 29.1 mL/min (A) (by C-G formula based on SCr of 2.38 mg/dL (H)). Liver Function Tests: Recent Labs  Lab 11/15/18 1627  AST 17  ALT 13  ALKPHOS 96  BILITOT 0.7  PROT 5.6*  ALBUMIN 2.9*   No results for input(s): LIPASE, AMYLASE in the last 168 hours. No results for input(s): AMMONIA in the last 168 hours. Coagulation Profile: No results for input(s): INR, PROTIME in the last 168 hours. Cardiac Enzymes: No results for input(s): CKTOTAL, CKMB, CKMBINDEX, TROPONINI in the last 168 hours. BNP (last 3 results) No results for input(s): PROBNP in the last 8760 hours. HbA1C: No results for input(s): HGBA1C in the last  72 hours. CBG: Recent Labs  Lab 11/16/18 1749 11/16/18 1916 11/16/18 2222 11/17/18 0744 11/17/18 1227  GLUCAP 147* 130* 197* 97 124*   Lipid Profile: No results for input(s): CHOL, HDL, LDLCALC, TRIG, CHOLHDL, LDLDIRECT in the last 72 hours. Thyroid Function Tests: No results for input(s): TSH, T4TOTAL, FREET4, T3FREE, THYROIDAB in the last 72 hours. Anemia Panel: No results for input(s): VITAMINB12, FOLATE, FERRITIN, TIBC, IRON, RETICCTPCT in the last 72 hours. Sepsis Labs: No results for input(s): PROCALCITON, LATICACIDVEN in the last 168 hours.  Recent Results (from the past 240 hour(s))  SARS Coronavirus 2 by RT PCR (hospital order, performed in [facility] hospital lab) Nasopharyngeal Nasopharyngeal Swab     Status: None   Collection Time: 11/15/18  5:58 PM   Specimen: Nasopharyngeal Swab  Result Value Ref Range Status   SARS Coronavirus 2 NEGATIVE NEGATIVE Final    Comment: (NOTE) If result is NEGATIVE SARS-CoV-2 target nucleic acids are NOT DETECTED. The SARS-CoV-2 RNA is generally detectable in upper and lower  respiratory specimens during the acute phase of infection. The lowest  concentration of SARS-CoV-2 viral copies this assay can detect is 250  copies / mL. A negative result does not preclude SARS-CoV-2 infection  and should not be used as the sole basis for treatment or other  patient management decisions.  A negative result may occur with  improper specimen collection / handling, submission of specimen other  than nasopharyngeal swab, presence of viral mutation(s) within the  areas targeted by this assay, and inadequate number of viral copies  (<250 copies / mL). A negative result must be combined with clinical  observations, patient history, and epidemiological information. If result is POSITIVE SARS-CoV-2 target nucleic acids are DETECTED. The SARS-CoV-2 RNA is generally detectable in upper and lower  respiratory specimens dur ing the acute phase of  infection.  Positive  results are indicative of active infection with SARS-CoV-2.  Clinical  correlation with patient history and other diagnostic information is  necessary to determine patient infection status.  Positive results do  not rule out bacterial infection or co-infection with other viruses. If result is PRESUMPTIVE POSTIVE SARS-CoV-2 nucleic acids MAY BE PRESENT.   A presumptive positive result was obtained on the submitted specimen  and confirmed on repeat testing.  While 2019 novel coronavirus  (SARS-CoV-2) nucleic acids may be present in the submitted sample  additional confirmatory testing may be necessary for epidemiological  and / or clinical management purposes  to differentiate between  SARS-CoV-2 and other Sarbecovirus currently known to infect humans.  If clinically indicated additional testing with an alternate test  methodology [phone]) is advised. The SARS-CoV-2 RNA is generally  detectable in upper and lower respiratory sp ecimens during the acute  phase of infection. The expected result is Negative. Fact Sheet for Patients:  StrictlyIdeas.no Fact Sheet for Healthcare Providers: BankingDealers.co.za This test is not yet approved or cleared by the [place] FDA and has been authorized for detection and/or diagnosis of SARS-CoV-2  by FDA under an Emergency Use Authorization (EUA).  This EUA will remain in effect (meaning this test can be used) for the duration of the COVID-19 declaration under Section 564(b)(1) of the Act, 21 U.S.C. section 360bbb-3(b)(1), unless the authorization is terminated or revoked sooner. Performed at [facility], [place] [address], [place], [place] [address]   MRSA PCR Screening     Status: None   Collection Time: 11/16/18  8:00 PM   Specimen: Nasopharyngeal  Result Value Ref Range Status   MRSA by PCR NEGATIVE NEGATIVE Final    Comment:        The GeneXpert MRSA  Assay (FDA approved for NASAL specimens only), is one component of a comprehensive MRSA colonization surveillance program. It is not intended to diagnose MRSA infection nor to guide or monitor treatment for MRSA infections. Performed at [facility], [place] [address], [place], [place] [address]          Radiology Studies: No results found.      Scheduled Meds: . amLODipine  5 mg Oral Daily  . atorvastatin  40 mg Oral q1800  . carvedilol  12.5 mg Oral BID WC  . doxazosin  8 mg Oral QPM  . ferrous sulfate  325 mg Oral Daily  . folic acid  1 mg Oral Daily  . hydrALAZINE  100 mg Oral TID  . insulin aspart  0-9 Units Subcutaneous TID WC  . isosorbide mononitrate  120 mg Oral Daily  . levothyroxine  100 mcg Oral Q0600  . mouth rinse  15 mL Mouth Rinse BID  . pantoprazole (PROTONIX) IV  40 mg Intravenous QHS  . torsemide  40 mg Oral Daily  . umeclidinium bromide  1 puff Inhalation Daily   Continuous Infusions:   LOS: 2 days     Cordelia Poche, MD [facility] 11/17/2018, 12:50 PM  If 7PM-7AM, please contact night-coverage [email or web address]

## 2018-11-17 NOTE — Progress Notes (Signed)
[facility] Progress Note    Since last GI note: He tolerated dinner last night without nausea or vomiting. No abd pains.  He did have a BM but isn't sure if it was black or not.  Objective: Vital signs in last 24 hours: Temp:  [97.6 F (36.4 C)-97.9 F (36.6 C)] 97.9 F (36.6 C) (10/14 0515) Pulse Rate:  [64-78] 67 (10/14 0515) Resp:  [13-25] 16 (10/14 0515) BP: (155-192)/(56-113) 178/64 (10/14 0515) SpO2:  [95 %-100 %] 99 % (10/14 0515) Weight:  [81 kg] 81 kg (10/13 1957) Last BM Date: 11/15/18 General: alert and oriented times 3 Heart: regular rate and rythm Abdomen: soft, non-tender, non-distended, normal bowel sounds  Lab Results: Recent Labs    11/15/18 1928 11/16/18 1039 11/17/18 0314  WBC 3.4* 3.8* 3.5*  HGB 5.4* 7.0* 7.2*  7.1*  PLT 95* 82* 81*  MCV 95.6 93.7 93.3   Recent Labs    11/15/18 1627 11/16/18 0448 11/17/18 0314  NA 141 143 142  K 3.3* 3.3* 3.9  CL 114* 116* 114*  CO2 20* 20* 21*  GLUCOSE 204* 116* 109*  BUN 49* 49* 51*  CREATININE 2.56* 2.52* 2.38*  CALCIUM 7.8* 7.7* 7.7*   Recent Labs    11/15/18 1627  PROT 5.6*  ALBUMIN 2.9*  AST 17  ALT 13  ALKPHOS 96  BILITOT 0.7     Medications: Scheduled Meds: . amLODipine  5 mg Oral Daily  . atorvastatin  40 mg Oral q1800  . carvedilol  12.5 mg Oral BID WC  . doxazosin  8 mg Oral QPM  . ferrous sulfate  325 mg Oral Daily  . folic acid  1 mg Oral Daily  . hydrALAZINE  100 mg Oral TID  . insulin aspart  0-9 Units Subcutaneous TID WC  . isosorbide mononitrate  120 mg Oral Daily  . levothyroxine  100 mcg Oral Q0600  . mouth rinse  15 mL Mouth Rinse BID  . pantoprazole (PROTONIX) IV  40 mg Intravenous QHS  . torsemide  40 mg Oral Daily  . umeclidinium bromide  1 puff Inhalation Daily   Assessment/Plan: 68 y.o. male with multiple significant comorbid conditions, currently slow oozing from gastric adenocarcinoma.  He has completed three unit blood transfusion with pretty  good response in Hb from 5.4 to 7.1 this morning. There are no endoscopic options that will provide anything more than temporary relief of his bleeding. It seems that the same is true for IR options. He is not a surgical candidate.  Agree with palliative care consultation.  I will advance him to solid food diet.  Please call or page with any further questions or concerns.   Milus Banister, MD  11/17/2018, 8:11 AM [facility] Pager [phone]

## 2018-11-18 LAB — CBC
HCT: 21.6 % — ABNORMAL LOW (ref 39.0–52.0)
Hemoglobin: 6.8 g/dL — CL (ref 13.0–17.0)
MCH: 29.3 pg (ref 26.0–34.0)
MCHC: 31.5 g/dL (ref 30.0–36.0)
MCV: 93.1 fL (ref 80.0–100.0)
Platelets: 84 10*3/uL — ABNORMAL LOW (ref 150–400)
RBC: 2.32 MIL/uL — ABNORMAL LOW (ref 4.22–5.81)
RDW: 15.4 % (ref 11.5–15.5)
WBC: 4 10*3/uL (ref 4.0–10.5)
nRBC: 0 % (ref 0.0–0.2)

## 2018-11-18 LAB — GLUCOSE, CAPILLARY
Glucose-Capillary: 113 mg/dL — ABNORMAL HIGH (ref 70–99)
Glucose-Capillary: 132 mg/dL — ABNORMAL HIGH (ref 70–99)
Glucose-Capillary: 119 mg/dL — ABNORMAL HIGH (ref 70–99)
Glucose-Capillary: 116 mg/dL — ABNORMAL HIGH (ref 70–99)

## 2018-11-18 LAB — BASIC METABOLIC PANEL
Anion gap: 7 (ref 5–15)
BUN: 52 mg/dL — ABNORMAL HIGH (ref 8–23)
CO2: 19 mmol/L — ABNORMAL LOW (ref 22–32)
Calcium: 7.7 mg/dL — ABNORMAL LOW (ref 8.9–10.3)
Chloride: 114 mmol/L — ABNORMAL HIGH (ref 98–111)
Creatinine, Ser: 2.95 mg/dL — ABNORMAL HIGH (ref 0.61–1.24)
GFR calc Af Amer: 24 mL/min — ABNORMAL LOW (ref >60)
GFR calc non Af Amer: 21 mL/min — ABNORMAL LOW (ref >60)
Glucose, Bld: 135 mg/dL — ABNORMAL HIGH (ref 70–99)
Potassium: 3.6 mmol/L (ref 3.5–5.1)
Sodium: 140 mmol/L (ref 135–145)

## 2018-11-18 LAB — PREPARE RBC (CROSSMATCH)

## 2018-11-18 MED ORDER — AMLODIPINE BESYLATE 10 MG PO TABS
10.0000 mg | ORAL_TABLET | Freq: Every day | ORAL | Status: DC
  Administered 2018-11-19 – 2018-11-24 (×6): 10 mg via ORAL
  Filled 2018-11-18 (×6): qty 1

## 2018-11-18 MED ORDER — AMLODIPINE BESYLATE 5 MG PO TABS
5.0000 mg | ORAL_TABLET | Freq: Once | ORAL | Status: AC
  Administered 2018-11-18: 5 mg via ORAL
  Filled 2018-11-18: qty 1

## 2018-11-18 MED ORDER — SODIUM CHLORIDE 0.9% IV SOLUTION: Freq: Once | INTRAVENOUS | Status: DC

## 2018-11-18 NOTE — Progress Notes (Addendum)
Spoke with palliative care medicine regarding their discussion with the patient. He would like to continue to receive blood transfusions.  He is aware that he is a poor surgical candidate.  [name] did tell him that he was a poor surgical candidate with very high surgical risks, but she was willing to operate on him if he so chooses knowing the risks associated with this choice.  Sounds like he and his family are wondering if any further radiation is an option.  I would have to defer this to oncology.  Will continue to follow, but no acute surgical plans at this time.  Defer transfusion to medicine currently.  Henreitta Cea 7:18 AM 11/18/2018

## 2018-11-18 NOTE — Care Management Important Message (Signed)
Important Message  Patient Details IM Letter given to Rhea Pink RN to present to the [name] MRN: [id number] Date of Birth: 15-Jul-1950   Medicare Important Message Given:  Yes     Kerin Salen 11/18/2018, 12:06 PM

## 2018-11-18 NOTE — Progress Notes (Signed)
PMT provider met with patient and family (sister and niece) at bedside to discuss goals of care. Patient and family understand he is not a candidate for chemotherapy or surgery. Explained GI and IR recommendations for palliative and against interventions that unfortunately do not provide a long-term fix. Patient wishes to continue with blood transfusions to prolong life. He is not ready today to make decisions regarding code status. Introduced and discussed MOST form and educated on medical recommendation for DNR/DNI with underlying terminal cancer. Introduced hospice philosophy and options. Patient and family also wondering if he is a candidate for any further radiation. Discussed with Dr. Lonny Prude who plans to follow up with consultants.   Full note to follow.   NO CHARGE  Ihor Dow, [place], FNP-C Palliative Medicine Team  Phone: [phone] Fax: [phone]

## 2018-11-18 NOTE — Progress Notes (Signed)
CRITICAL VALUE ALERT  Critical Value:  hgb 6.8  Date & Time Notied:  11-18-2018 @ 0550  Provider Notified: Schorr, NP  Orders Received/Actions taken: waiting response

## 2018-11-18 NOTE — Progress Notes (Addendum)
PROGRESS NOTE    MAKENZIE [name]  SAY:[id number] DOB: 02/01/1951 DOA: 11/15/2018 PCP: Kathrene Alu, MD   Brief Narrative: Leonard [name] is a 68 y.o. 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 Patient evaluated by GI, IR and general surgery with recommendations for palliative care discussions. Complicated by thrombocytopenia. Patient with a hemoglobin of 5.4-5.8 and is s/p 3 units of PRBC. Current hemoglobin trended down again today at 6.8 overnight. 1 unit of PRBC ordered today. Patient is unsure if he has had dark stools. -Palliative care recommendations -Will consult Radiation oncology as patient/family have specific questions with regard to possibility of radiation treatment for treatment of cancer  Pancytopenia In setting of malignancy. Currently stable except for continually dropping hemoglobin secondary to above  Gastric adenocarcinoma Patient followed by medical oncology, radiation oncology, general surgery as an outpatient. Currently not receiving chemotherapy secondary to high risk. He has received radiation therapy which appears to have been tolerated well; course was completed on 7/16.  CKD stage IV Creatinine worsened slightly. -Continue to watch Creatinine  Diabetes mellitus, type 2 -Continue SSI  Essential hypertension Uncontrolled. -Continue Imdur daily, torsemide daily, Carvedilol BID, amlodipine daily, hydralazine prn  History of CAD -Holding aspirin  PVD S/p left AKA Noted.   DVT prophylaxis: SCDs Code Status:   Code Status: Full Code Family Communication: None at bedside Disposition Plan: Discharge pending specialist recommendations   Consultants:    General surgery  Gastroenterology  Palliative care medicine  Interventional radiology  Procedures:   10/12: 2 units PRBC  10/13: 1 unit PRBC  10/14: 1 unit PRBC  Antimicrobials:  None    Subjective: No concerns currently. Inquiring about further radiation therapy for his adenocarcinoma.  Objective: Vitals:   11/18/18 1000 11/18/18 1131 11/18/18 1255 11/18/18 1321  BP: (!) 194/72 (!) 178/69 (!) 170/67 (!) 166/68  Pulse: 78 77 76 73  Resp: 18  18 16   Temp: 98.1 F (36.7 C)  98.3 F (36.8 C) 98.6 F (37 C)  TempSrc: Oral  Oral Oral  SpO2: 100%  98% 99%  Weight:      Height:        Intake/Output Summary (Last 24 hours) at 11/18/2018 1424 Last data filed at 11/18/2018 1251 Gross per 24 hour  Intake 555 ml  Output 820 ml  Net -265 ml   Filed Weights   11/16/18 1957  Weight: 81 kg    Examination:  General exam: Appears calm and comfortable Respiratory system: Clear to auscultation. Respiratory effort normal. Cardiovascular system: S1 & S2 heard, RRR. No murmurs, rubs, gallops or clicks. Gastrointestinal system: Abdomen is nondistended, soft and nontender. No organomegaly or masses felt. Normal bowel sounds heard. Central nervous system: Alert and oriented. No focal neurological deficits. Extremities: No edema. No calf tenderness Skin: No cyanosis. No rashes Psychiatry: Judgement and insight appear normal. Mood & affect appropriate.     Data Reviewed: I have personally reviewed following labs and imaging studies  CBC: Recent Labs  Lab 11/15/18 1627 11/15/18 1928 11/16/18 1039 11/17/18 0314 11/18/18 0349  WBC 3.5* 3.4* 3.8* 3.5* 4.0  HGB 5.8* 5.4* 7.0* 7.2*  7.1* 6.8*  HCT 18.8* 17.4* 22.2* 23.5*  22.8* 21.6*  MCV 95.4 95.6 93.7 93.3 93.1  PLT 110* 95* 82* 81* 84*   Basic Metabolic Panel: Recent Labs  Lab 11/15/18 1627 11/16/18 0448 11/17/18 0314 11/18/18 0349  NA 141 143 142 140  K 3.3* 3.3* 3.9 3.6  CL 114* 116* 114* 114*  CO2 20*  20* 21* 19*  GLUCOSE 204* 116* 109* 135*  BUN 49* 49* 51* 52*  CREATININE 2.56* 2.52* 2.38* 2.95*  CALCIUM 7.8* 7.7* 7.7* 7.7*   GFR: Estimated Creatinine Clearance: 23.5 mL/min (A) (by C-G formula based on SCr of 2.95 mg/dL (H)). Liver Function Tests: Recent Labs  Lab 11/15/18 1627  AST 17  ALT 13  ALKPHOS 96  BILITOT 0.7  PROT 5.6*  ALBUMIN 2.9*   No results for input(s): LIPASE, AMYLASE in the last 168 hours. No results for input(s): AMMONIA in the last 168 hours. Coagulation Profile: No results for input(s): INR, PROTIME in the last 168 hours. Cardiac Enzymes: No results for input(s): CKTOTAL, CKMB, CKMBINDEX, TROPONINI in the last 168 hours. BNP (last 3 results) No results for input(s): PROBNP in the last 8760 hours. HbA1C: No results for input(s): HGBA1C in the last 72 hours. CBG: Recent Labs  Lab 11/17/18 1227 11/17/18 1559 11/17/18 2218 11/18/18 0756 11/18/18 1149  GLUCAP 124* 142* 139* 113* 132*   Lipid Profile: No results for input(s): CHOL, HDL, LDLCALC, TRIG, CHOLHDL, LDLDIRECT in the last 72 hours. Thyroid Function Tests: No results for input(s): TSH, T4TOTAL, FREET4, T3FREE, THYROIDAB in the last 72 hours. Anemia Panel: No results for input(s): VITAMINB12, FOLATE, FERRITIN, TIBC, IRON, RETICCTPCT in the last 72 hours. Sepsis Labs: No results for input(s): PROCALCITON, LATICACIDVEN in the last 168 hours.  Recent Results (from the past 240 hour(s))  SARS Coronavirus 2 by RT PCR (hospital order, performed in [facility] hospital lab) Nasopharyngeal Nasopharyngeal Swab     Status: None   Collection Time: 11/15/18  5:58 PM   Specimen: Nasopharyngeal Swab  Result Value Ref Range Status   SARS Coronavirus 2 NEGATIVE NEGATIVE Final    Comment: (NOTE) If result is NEGATIVE SARS-CoV-2 target nucleic acids are NOT DETECTED. The SARS-CoV-2 RNA is generally detectable in upper and lower  respiratory specimens during the acute phase of infection. The lowest   concentration of SARS-CoV-2 viral copies this assay can detect is 250  copies / mL. A negative result does not preclude SARS-CoV-2 infection  and should not be used as the sole basis for treatment or other  patient management decisions.  A negative result may occur with  improper specimen collection / handling, submission of specimen other  than nasopharyngeal swab, presence of viral mutation(s) within the  areas targeted by this assay, and inadequate number of viral copies  (<250 copies / mL). A negative result must be combined with clinical  observations, patient history, and epidemiological information. If result is POSITIVE SARS-CoV-2 target nucleic acids are DETECTED. The SARS-CoV-2 RNA is generally detectable in upper and lower  respiratory specimens dur ing the acute phase of infection.  Positive  results are indicative of active infection with SARS-CoV-2.  Clinical  correlation with patient history and other diagnostic information is  necessary to determine patient infection status.  Positive results do  not rule out bacterial infection or co-infection with other viruses. If result is PRESUMPTIVE POSTIVE SARS-CoV-2 nucleic acids MAY BE PRESENT.   A presumptive positive result was obtained on the submitted specimen  and confirmed on repeat testing.  While 2019 novel coronavirus  (  SARS-CoV-2) nucleic acids may be present in the submitted sample  additional confirmatory testing may be necessary for epidemiological  and / or clinical management purposes  to differentiate between  SARS-CoV-2 and other Sarbecovirus currently known to infect humans.  If clinically indicated additional testing with an alternate test  methodology [phone]) is advised. The SARS-CoV-2 RNA is generally  detectable in upper and lower respiratory sp ecimens during the acute  phase of infection. The expected result is Negative. Fact Sheet for Patients:  StrictlyIdeas.no Fact Sheet  for Healthcare Providers: BankingDealers.co.za This test is not yet approved or cleared by the [place] FDA and has been authorized for detection and/or diagnosis of SARS-CoV-2 by FDA under an Emergency Use Authorization (EUA).  This EUA will remain in effect (meaning this test can be used) for the duration of the COVID-19 declaration under Section 564(b)(1) of the Act, 21 U.S.C. section 360bbb-3(b)(1), unless the authorization is terminated or revoked sooner. Performed at [facility], [place] [address] [place], [place] [address]   MRSA PCR Screening     Status: None   Collection Time: 11/16/18  8:00 PM   Specimen: Nasopharyngeal  Result Value Ref Range Status   MRSA by PCR NEGATIVE NEGATIVE Final    Comment:        The GeneXpert MRSA Assay (FDA approved for NASAL specimens only), is one component of a comprehensive MRSA colonization surveillance program. It is not intended to diagnose MRSA infection nor to guide or monitor treatment for MRSA infections. Performed at [facility], [place] [address], [place], [place] [address]          Radiology Studies: No results found.      Scheduled Meds: . sodium chloride   Intravenous Once  . amLODipine  5 mg Oral Daily  . atorvastatin  40 mg Oral q1800  . carvedilol  12.5 mg Oral BID WC  . doxazosin  8 mg Oral QPM  . ferrous sulfate  325 mg Oral Daily  . folic acid  1 mg Oral Daily  . hydrALAZINE  100 mg Oral TID  . insulin aspart  0-9 Units Subcutaneous TID WC  . isosorbide mononitrate  120 mg Oral Daily  . levothyroxine  100 mcg Oral Q0600  . mouth rinse  15 mL Mouth Rinse BID  . pantoprazole (PROTONIX) IV  40 mg Intravenous QHS  . torsemide  40 mg Oral Daily  . umeclidinium bromide  1 puff Inhalation Daily   Continuous Infusions:   LOS: 3 days     Cordelia Poche, MD [facility] 11/18/2018, 2:24 PM  If 7PM-7AM, please contact night-coverage  [email or web address]

## 2018-11-19 LAB — CBC
HCT: 20.9 % — ABNORMAL LOW (ref 39.0–52.0)
HCT: 21.7 % — ABNORMAL LOW (ref 39.0–52.0)
HCT: 23.2 % — ABNORMAL LOW (ref 39.0–52.0)
Hemoglobin: 6.7 g/dL — CL (ref 13.0–17.0)
Hemoglobin: 6.7 g/dL — CL (ref 13.0–17.0)
Hemoglobin: 7.4 g/dL — ABNORMAL LOW (ref 13.0–17.0)
MCH: 29.9 pg (ref 26.0–34.0)
MCH: 28.6 pg (ref 26.0–34.0)
MCH: 29.1 pg (ref 26.0–34.0)
MCHC: 32.1 g/dL (ref 30.0–36.0)
MCHC: 30.9 g/dL (ref 30.0–36.0)
MCHC: 31.9 g/dL (ref 30.0–36.0)
MCV: 93.3 fL (ref 80.0–100.0)
MCV: 92.7 fL (ref 80.0–100.0)
MCV: 91.3 fL (ref 80.0–100.0)
Platelets: 82 10*3/uL — ABNORMAL LOW (ref 150–400)
Platelets: 78 10*3/uL — ABNORMAL LOW (ref 150–400)
Platelets: 86 10*3/uL — ABNORMAL LOW (ref 150–400)
RBC: 2.24 MIL/uL — ABNORMAL LOW (ref 4.22–5.81)
RBC: 2.34 MIL/uL — ABNORMAL LOW (ref 4.22–5.81)
RBC: 2.54 MIL/uL — ABNORMAL LOW (ref 4.22–5.81)
RDW: 15.6 % — ABNORMAL HIGH (ref 11.5–15.5)
RDW: 15.7 % — ABNORMAL HIGH (ref 11.5–15.5)
RDW: 15.1 % (ref 11.5–15.5)
WBC: 5.7 10*3/uL (ref 4.0–10.5)
WBC: 5.5 10*3/uL (ref 4.0–10.5)
WBC: 6.2 10*3/uL (ref 4.0–10.5)
nRBC: 0 % (ref 0.0–0.2)
nRBC: 0 % (ref 0.0–0.2)
nRBC: 0 % (ref 0.0–0.2)

## 2018-11-19 LAB — BASIC METABOLIC PANEL
Anion gap: 6 (ref 5–15)
Anion gap: 6 (ref 5–15)
BUN: 62 mg/dL — ABNORMAL HIGH (ref 8–23)
BUN: 63 mg/dL — ABNORMAL HIGH (ref 8–23)
CO2: 21 mmol/L — ABNORMAL LOW (ref 22–32)
CO2: 21 mmol/L — ABNORMAL LOW (ref 22–32)
Calcium: 7.6 mg/dL — ABNORMAL LOW (ref 8.9–10.3)
Calcium: 7.7 mg/dL — ABNORMAL LOW (ref 8.9–10.3)
Chloride: 112 mmol/L — ABNORMAL HIGH (ref 98–111)
Chloride: 114 mmol/L — ABNORMAL HIGH (ref 98–111)
Creatinine, Ser: 2.98 mg/dL — ABNORMAL HIGH (ref 0.61–1.24)
Creatinine, Ser: 2.97 mg/dL — ABNORMAL HIGH (ref 0.61–1.24)
GFR calc Af Amer: 24 mL/min — ABNORMAL LOW (ref >60)
GFR calc Af Amer: 24 mL/min — ABNORMAL LOW (ref >60)
GFR calc non Af Amer: 21 mL/min — ABNORMAL LOW (ref >60)
GFR calc non Af Amer: 21 mL/min — ABNORMAL LOW (ref >60)
Glucose, Bld: 131 mg/dL — ABNORMAL HIGH (ref 70–99)
Glucose, Bld: 127 mg/dL — ABNORMAL HIGH (ref 70–99)
Potassium: 3.3 mmol/L — ABNORMAL LOW (ref 3.5–5.1)
Potassium: 3.7 mmol/L (ref 3.5–5.1)
Sodium: 139 mmol/L (ref 135–145)
Sodium: 141 mmol/L (ref 135–145)

## 2018-11-19 LAB — PREPARE RBC (CROSSMATCH)

## 2018-11-19 LAB — GLUCOSE, CAPILLARY
Glucose-Capillary: 110 mg/dL — ABNORMAL HIGH (ref 70–99)
Glucose-Capillary: 127 mg/dL — ABNORMAL HIGH (ref 70–99)
Glucose-Capillary: 145 mg/dL — ABNORMAL HIGH (ref 70–99)
Glucose-Capillary: 114 mg/dL — ABNORMAL HIGH (ref 70–99)

## 2018-11-19 MED ORDER — POTASSIUM CHLORIDE CRYS ER 20 MEQ PO TBCR
40.0000 meq | EXTENDED_RELEASE_TABLET | Freq: Once | ORAL | Status: AC
  Administered 2018-11-19: 40 meq via ORAL
  Filled 2018-11-19: qty 2

## 2018-11-19 MED ORDER — SODIUM CHLORIDE 0.9% IV SOLUTION: Freq: Once | INTRAVENOUS | Status: DC

## 2018-11-19 NOTE — Progress Notes (Signed)
PROGRESS NOTE    Leonard [name]  ZJI:[id number] DOB: 1950-04-06 DOA: 11/15/2018 PCP: Kathrene Alu, MD   Brief Narrative: Leonard [name] is a 68 y.o. 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 Patient evaluated by GI, IR and general surgery with recommendations for palliative care discussions. Complicated by thrombocytopenia. Patient with a hemoglobin of 5.4-5.8 on admission and is s/p 4 units of PRBC. Current hemoglobin trended down again today at 6.7 overnight. 1 unit of PRBC ordered today. -Palliative care recommendations -Radiation oncology recommendations pending. Discussed consult on 10/15  Pancytopenia In setting of malignancy. Currently stable except for continually dropping hemoglobin secondary to above  Gastric adenocarcinoma Patient followed by medical oncology, radiation oncology, general surgery as an outpatient. Currently not receiving chemotherapy secondary to high risk. He has received radiation therapy which appears to have been tolerated well; course was completed on 7/16.  CKD stage IV Creatinine stable. -Continue to watch Creatinine -Avoid nephrotoxic agents/dose adjust medications  Diabetes mellitus, type 2 -Continue SSI  Essential hypertension Uncontrolled. -Continue Imdur daily, torsemide daily, Carvedilol BID, amlodipine daily, hydralazine prn  History of CAD -Holding aspirin  PVD S/p left AKA Noted.   DVT prophylaxis: SCDs Code Status:   Code Status: Full Code Family Communication: None at bedside Disposition Plan: Discharge pending specialist recommendations   Consultants:   General surgery  Gastroenterology  Palliative care medicine   Interventional radiology  Radiation oncology  Procedures:   10/12: 2 units PRBC  10/13: 1 unit PRBC  10/15: 1 unit PRBC  10/16: 1 unit PRBC  Antimicrobials:  None    Subjective: Nausea improved. No other issues overnight.  Objective: Vitals:   11/19/18 0449 11/19/18 0946 11/19/18 1032 11/19/18 1105  BP: (!) 173/66 (!) 155/56 (!) 156/54 (!) 153/62  Pulse: 76 73 75 74  Resp: 18  20 18   Temp: 98.5 F (36.9 C)  98.7 F (37.1 C) 98 F (36.7 C)  TempSrc: Oral  Oral   SpO2: 98% 96% 94% 94%  Weight:      Height:        Intake/Output Summary (Last 24 hours) at 11/19/2018 1205 Last data filed at 11/19/2018 0700 Gross per 24 hour  Intake 315 ml  Output 2485 ml  Net -2170 ml   Filed Weights   11/16/18 1957  Weight: 81 kg    Examination:  General exam: Appears calm and comfortable Respiratory system: Clear to auscultation. Respiratory effort normal. Cardiovascular system: S1 & S2 heard, RRR. No murmurs, rubs, gallops or clicks. Gastrointestinal system: Abdomen is nondistended, soft and nontender. No organomegaly or masses felt. Normal bowel sounds heard. Central nervous system: Alert and oriented. No focal neurological deficits. Extremities: No edema. No calf tenderness Skin: No cyanosis. No rashes Psychiatry: Judgement and insight appear normal. Mood & affect appropriate.    Data Reviewed: I have personally reviewed following labs and imaging studies  CBC: Recent Labs  Lab 11/16/18 1039 11/17/18 0314 11/18/18 0349 11/19/18 0353 11/19/18 0952  WBC 3.8* 3.5* 4.0 5.7 5.5  HGB 7.0* 7.2*  7.1* 6.8* 6.7* 6.7*  HCT 22.2* 23.5*  22.8* 21.6* 20.9* 21.7*  MCV 93.7 93.3 93.1 93.3 92.7  PLT 82* 81*  84* 82* 78*   Basic Metabolic Panel: Recent Labs  Lab 11/16/18 0448 11/17/18 0314 11/18/18 0349 11/19/18 0353 11/19/18 0952  NA 143 142 140 139 141  K 3.3* 3.9 3.6 3.3* 3.7  CL 116* 114* 114* 112* 114*  CO2 20* 21* 19* 21* 21*  GLUCOSE 116* 109* 135* 131*  127*  BUN 49* 51* 52* 62* 63*  CREATININE 2.52* 2.38* 2.95* 2.98* 2.97*  CALCIUM 7.7* 7.7* 7.7* 7.6* 7.7*   GFR: Estimated Creatinine Clearance: 23.4 mL/min (A) (by C-G formula based on SCr of 2.97 mg/dL (H)). Liver Function Tests: Recent Labs  Lab 11/15/18 1627  AST 17  ALT 13  ALKPHOS 96  BILITOT 0.7  PROT 5.6*  ALBUMIN 2.9*   No results for input(s): LIPASE, AMYLASE in the last 168 hours. No results for input(s): AMMONIA in the last 168 hours. Coagulation Profile: No results for input(s): INR, PROTIME in the last 168 hours. Cardiac Enzymes: No results for input(s): CKTOTAL, CKMB, CKMBINDEX, TROPONINI in the last 168 hours. BNP (last 3 results) No results for input(s): PROBNP in the last 8760 hours. HbA1C: No results for input(s): HGBA1C in the last 72 hours. CBG: Recent Labs  Lab 11/18/18 1149 11/18/18 1640 11/18/18 2116 11/19/18 0739 11/19/18 1156  GLUCAP 132* 119* 116* 110* 127*   Lipid Profile: No results for input(s): CHOL, HDL, LDLCALC, TRIG, CHOLHDL, LDLDIRECT in the last 72 hours. Thyroid Function Tests: No results for input(s): TSH, T4TOTAL, FREET4, T3FREE, THYROIDAB in the last 72 hours. Anemia Panel: No results for input(s): VITAMINB12, FOLATE, FERRITIN, TIBC, IRON, RETICCTPCT in the last 72 hours. Sepsis Labs: No results for input(s): PROCALCITON, LATICACIDVEN in the last 168 hours.  Recent Results (from the past 240 hour(s))  SARS Coronavirus 2 by RT PCR (hospital order, performed in [facility] hospital lab) Nasopharyngeal Nasopharyngeal Swab     Status: None   Collection Time: 11/15/18  5:58 PM   Specimen: Nasopharyngeal Swab  Result Value Ref Range Status   SARS Coronavirus 2 NEGATIVE NEGATIVE Final    Comment: (NOTE) If result is NEGATIVE SARS-CoV-2 target nucleic acids are NOT DETECTED. The SARS-CoV-2 RNA is generally detectable in upper and lower  respiratory specimens during the acute phase of infection. The lowest  concentration of  SARS-CoV-2 viral copies this assay can detect is 250  copies / mL. A negative result does not preclude SARS-CoV-2 infection  and should not be used as the sole basis for treatment or other  patient management decisions.  A negative result may occur with  improper specimen collection / handling, submission of specimen other  than nasopharyngeal swab, presence of viral mutation(s) within the  areas targeted by this assay, and inadequate number of viral copies  (<250 copies / mL). A negative result must be combined with clinical  observations, patient history, and epidemiological information. If result is POSITIVE SARS-CoV-2 target nucleic acids are DETECTED. The SARS-CoV-2 RNA is generally detectable in upper and lower  respiratory specimens dur ing the acute phase of infection.  Positive  results are indicative of active infection with SARS-CoV-2.  Clinical  correlation with patient history and other diagnostic information is  necessary to determine patient infection status.  Positive results do  not rule out bacterial infection or co-infection with other viruses. If result is PRESUMPTIVE POSTIVE SARS-CoV-2 nucleic acids MAY BE PRESENT.   A presumptive positive result was obtained on the submitted specimen  and confirmed on repeat testing.  While 2019 novel coronavirus  (SARS-CoV-2) nucleic acids  may be present in the submitted sample  additional confirmatory testing may be necessary for epidemiological  and / or clinical management purposes  to differentiate between  SARS-CoV-2 and other Sarbecovirus currently known to infect humans.  If clinically indicated additional testing with an alternate test  methodology [phone]) is advised. The SARS-CoV-2 RNA is generally  detectable in upper and lower respiratory sp ecimens during the acute  phase of infection. The expected result is Negative. Fact Sheet for Patients:  StrictlyIdeas.no Fact Sheet for Healthcare  Providers: BankingDealers.co.za This test is not yet approved or cleared by the [place] FDA and has been authorized for detection and/or diagnosis of SARS-CoV-2 by FDA under an Emergency Use Authorization (EUA).  This EUA will remain in effect (meaning this test can be used) for the duration of the COVID-19 declaration under Section 564(b)(1) of the Act, 21 U.S.C. section 360bbb-3(b)(1), unless the authorization is terminated or revoked sooner. Performed at [facility], [place] [address] [place], [place] [address]   MRSA PCR Screening     Status: None   Collection Time: 11/16/18  8:00 PM   Specimen: Nasopharyngeal  Result Value Ref Range Status   MRSA by PCR NEGATIVE NEGATIVE Final    Comment:        The GeneXpert MRSA Assay (FDA approved for NASAL specimens only), is one component of a comprehensive MRSA colonization surveillance program. It is not intended to diagnose MRSA infection nor to guide or monitor treatment for MRSA infections. Performed at [facility], [place] [address] [place], [place] [address]          Radiology Studies: No results found.      Scheduled Meds: . sodium chloride   Intravenous Once  . sodium chloride   Intravenous Once  . amLODipine  10 mg Oral Daily  . atorvastatin  40 mg Oral q1800  . carvedilol  12.5 mg Oral BID WC  . doxazosin  8 mg Oral QPM  . ferrous sulfate  325 mg Oral Daily  . folic acid  1 mg Oral Daily  . hydrALAZINE  100 mg Oral TID  . insulin aspart  0-9 Units Subcutaneous TID WC  . isosorbide mononitrate  120 mg Oral Daily  . levothyroxine  100 mcg Oral Q0600  . mouth rinse  15 mL Mouth Rinse BID  . pantoprazole (PROTONIX) IV  40 mg Intravenous QHS  . torsemide  40 mg Oral Daily  . umeclidinium bromide  1 puff Inhalation Daily   Continuous Infusions:   LOS: 4 days     Cordelia Poche, MD [facility] 11/19/2018, 12:05 PM  If 7PM-7AM, please  contact night-coverage [email or web address]

## 2018-11-19 NOTE — Progress Notes (Signed)
Social visit today to discuss surgical options with the patient, the same ones [name] presented to him.  He is high risk for surgery, but that she would operate on him if he really wanted her to.  The patient said he isn't sure what he wants to do.  I told him that he really needs to decide how he wants to proceed so that we can know how to treat him as he continues to ooze.  He states he will talk to his family.  He will be prn over the weekend for [place].    Leonard [name] 10:45 AM 11/19/2018

## 2018-11-19 NOTE — TOC Initial Note (Signed)
Transition of Care [facility]) - Initial/Assessment Note    Patient Details  Name: Leonard [name] MRN: [id number] Date of Birth: [birth year]  [facility] of [facility] CM/SW Contact:    Wende Neighbors, LCSW Phone Number: 11/19/2018, 11:53 AM  Clinical Narrative:    CSW following patient for support and discharge needs. CSW unable to do assessment with patient due to him being asleep. Patient is from [place] and is there for Long Term care. CSW spoke with admission coordinator Tammy via phone and she sated they would love to have patient back once medically cleared. CSW made facility aware that patient will need to be followed by palliative at facility.                Expected Discharge Plan: Skilled Nursing Facility Barriers to Discharge: Continued Medical Work up   Patient Goals and CMS Choice Patient states their goals for this hospitalization and ongoing recovery are:: return to facility CMS Medicare.gov Compare Post Acute Care list provided to:: Patient    Expected Discharge Plan and Services Expected Discharge Plan: [place] In-house Referral: Clinical Social Work     Living arrangements for the past 2 months: [place] Expected Discharge Date: (unknown)                                    Prior Living Arrangements/Services Living arrangements for the past 2 months: [place] Lives with:: Facility Resident Patient language and need for interpreter reviewed:: Yes        Need for Family Participation in Patient Care: No (Comment) Care giver support system in place?: Yes (comment)   Criminal Activity/Legal Involvement Pertinent to Current Situation/Hospitalization: No - Comment as needed  Activities of Daily Living Home Assistive Devices/Equipment: Eyeglasses, Wheelchair, Grab bars around toilet, Grab bars in shower, Hand-held shower hose, Blood pressure cuff, Hospital bed, Scales([facility] has  necessary equipment for their residents) ADL Screening (condition at time of admission) Patient's cognitive ability adequate to safely complete daily activities?: Yes Is the patient deaf or have difficulty hearing?: Yes(slightly hoh) Does the patient have difficulty seeing, even when wearing glasses/contacts?: No Does the patient have difficulty concentrating, remembering, or making decisions?: No Patient able to express need for assistance with ADLs?: Yes Does the patient have difficulty dressing or bathing?: Yes Independently performs ADLs?: No Communication: Independent Dressing (OT): Needs assistance Is this a change from baseline?: Pre-admission baseline Grooming: Needs assistance Is this a change from baseline?: Pre-admission baseline Feeding: Independent Is this a change from baseline?: Pre-admission baseline Bathing: Needs assistance Is this a change from baseline?: Pre-admission baseline Toileting: Needs assistance Is this a change from baseline?: Pre-admission baseline In/Out Bed: Needs assistance Is this a change from baseline?: Pre-admission baseline Walks in Home: Dependent Is this a change from baseline?: Pre-admission baseline Does the patient have difficulty walking or climbing stairs?: Yes(secondary to weakness) Weakness of Legs: Both Weakness of Arms/Hands: None  Permission Sought/Granted Permission sought to share information with : Family Supports Permission granted to share information with : Yes, Verbal Permission Granted  Share Information with NAME: rosalyn Perrault     Permission granted to share info w Relationship: niece  Permission granted to share info w Contact Information: [phone]  Emotional Assessment Appearance:: Appears stated age Attitude/Demeanor/Rapport: Unable to Assess(patient was sleeping) Affect (typically observed): Unable to Assess Orientation: : Oriented to Self, Oriented to Place, Oriented to  Time, Oriented to Situation Alcohol  / Substance Use: Not Applicable Psych Involvement: No (comment)  Admission diagnosis:  Anemia due to acute blood loss [D62] GI bleed [K92.2] Gastric adenocarcinoma (HCC) [C16.9] Patient Active Problem List   Diagnosis Date Noted  . GI bleed 11/16/2018  . Acute anemia 11/15/2018  . Gastric adenocarcinoma ([place]) 09/18/2018  . Symptomatic anemia 07/20/2018  . Hematemesis/vomiting blood 07/15/2018  . Acute blood loss anemia 07/15/2018  . Anemia due to chronic blood loss   . Upper GI bleed 06/23/2018  . Acute renal failure superimposed on chronic kidney disease ([place])   . Abdominal pain   . DNR (do not resuscitate)   . 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[place])   . DNR (do not resuscitate) discussion   . Anemia 03/29/2018  . Acute GI bleeding 03/28/2018  . Dysphagia 10/01/2017  . Palliative care by specialist   . Severe comorbid illness   . Moderate malnutrition ([place]) 08/27/2017  . Hypoxemia   . Atelectasis   . Chronic respiratory failure with hypoxia ([place]) 08/25/2017  . Pulmonary artery hypertension ([place]) 07/25/2017  . Pressure injury of skin 07/20/2017  . Goals of care, counseling/discussion 02/11/2017  . Diabetic polyneuropathy associated with type 2 diabetes mellitus ([place]) 01/14/2017  . Pleural effusion   . Chronic anemia 02/28/2016  . Weakness generalized 01/21/2016  . Diabetic retinopathy ([place]) 11/13/2015  . Iron deficiency anemia 08/03/2015  . Type II diabetes mellitus with complication ([place])   . Chronic kidney disease (CKD), stage III (moderate)   . MGUS (monoclonal gammopathy of unknown significance) 01/15/2015  . Deficiency anemia 01/15/2015  . CKD (chronic kidney disease) stage 4, GFR 15-29 ml/min (HCC) 01/15/2015  . Hypothyroidism 11/22/2014  . COPD GOLD III  02/16/2014  . Anemia of renal disease   . Chronic diastolic CHF (congestive heart failure) ([place])   . History of tobacco use 08/23/2013  . 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 CAD (coronary artery disease) 04/06/2013   . PVD - hx of Rt SFA PTA and s/p Rt 4-5th toe amp Dec 2014 04/05/2013  . 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 Mixed hyperlipidemia 07/17/2008   PCP:  Kathrene Alu, MD Pharmacy:  No Pharmacies Listed    Social Determinants of Health (SDOH) Interventions    Readmission Risk Interventions No flowsheet data found.

## 2018-11-20 LAB — TYPE AND SCREEN
ABO/RH(D): B NEG
Antibody Screen: NEGATIVE
Unit division: 0
Unit division: 0
Unit division: 0
Unit division: 0

## 2018-11-20 LAB — GLUCOSE, CAPILLARY
Glucose-Capillary: 108 mg/dL — ABNORMAL HIGH (ref 70–99)
Glucose-Capillary: 172 mg/dL — ABNORMAL HIGH (ref 70–99)
Glucose-Capillary: 145 mg/dL — ABNORMAL HIGH (ref 70–99)

## 2018-11-20 LAB — BPAM RBC
Blood Product Expiration Date: 202010262359
Blood Product Expiration Date: 202011072359
Blood Product Expiration Date: 202011092359
Blood Product Expiration Date: 202011102359
ISSUE DATE / TIME: 202010122017
ISSUE DATE / TIME: 202010122338
ISSUE DATE / TIME: 202010132216
ISSUE DATE / TIME: 202010150932
Unit Type and Rh: 1700
Unit Type and Rh: 9500
Unit Type and Rh: 9500
Unit Type and Rh: 9500

## 2018-11-20 LAB — BASIC METABOLIC PANEL
Anion gap: 8 (ref 5–15)
BUN: 64 mg/dL — ABNORMAL HIGH (ref 8–23)
CO2: 20 mmol/L — ABNORMAL LOW (ref 22–32)
Calcium: 7.9 mg/dL — ABNORMAL LOW (ref 8.9–10.3)
Chloride: 115 mmol/L — ABNORMAL HIGH (ref 98–111)
Creatinine, Ser: 2.82 mg/dL — ABNORMAL HIGH (ref 0.61–1.24)
GFR calc Af Amer: 26 mL/min — ABNORMAL LOW (ref >60)
GFR calc non Af Amer: 22 mL/min — ABNORMAL LOW (ref >60)
Glucose, Bld: 129 mg/dL — ABNORMAL HIGH (ref 70–99)
Potassium: 3.7 mmol/L (ref 3.5–5.1)
Sodium: 143 mmol/L (ref 135–145)

## 2018-11-20 LAB — CBC
HCT: 23.3 % — ABNORMAL LOW (ref 39.0–52.0)
Hemoglobin: 7.5 g/dL — ABNORMAL LOW (ref 13.0–17.0)
MCH: 29.5 pg (ref 26.0–34.0)
MCHC: 32.2 g/dL (ref 30.0–36.0)
MCV: 91.7 fL (ref 80.0–100.0)
Platelets: 86 10*3/uL — ABNORMAL LOW (ref 150–400)
RBC: 2.54 MIL/uL — ABNORMAL LOW (ref 4.22–5.81)
RDW: 15.1 % (ref 11.5–15.5)
WBC: 4.9 10*3/uL (ref 4.0–10.5)
nRBC: 0 % (ref 0.0–0.2)

## 2018-11-20 NOTE — Progress Notes (Signed)
I received care of this patient from the day nurse Katelyn during 7pm shift change.

## 2018-11-20 NOTE — Progress Notes (Signed)
Dr. Lisbeth Renshaw and I are very familiar with the patient's case. Unfortunately he had a very early stomach cancer but was not a good surgical candidate. He was not a candidate for chemosensitization, but did receive a definitive course of radiotherapy. Medical oncology does not follow this patient. Despite radiotherapy, he continued to have episodes of acute on chronic blood loss anemia requiring multiple transfusions. He also has a longstanding history of renal disease and has been given Aranesp by nephrology, and has chronic pancytopenia. His blood volume has been replaced on multiple occasions and he lives at a long term care facility that does not have access to ordering outpatient blood products which has resulted in multiple past medical admissions. Unfortunately he recently underwent endoscopic assessment and there was evidence of bleeding and probable persistent tumor. He was seen by surgical oncology and offered surgical resection with the understanding that there was high morbidity and mortality risks. As such he and his family (sister [name] and niece Karie Kirks) decided to forgo this. Unfortunately he has not had the direction for further decision making apart from discussions with [place] in rad onc and with [name]. Last week after speaking with [name] she indicated that the family had decided for hospice based care in the facility he resides in. He was admitted again this week due to his anemia. We were called to see if there were any further palliative radiotherapy options to control his bleeding. Give that he received a definitive course of radiotherapy, not a palliative one, the recency of the treatment, and the fact that it did not eradicate his disease, he would not be a candidate for further radiation to this site. It is a disappointing and sad situation. I hope that whatever he chooses for placement at the time of discharge, that he be able to be around his family since they really haven't been able to  be around him through any of this process due to covid pandemic.     Carola Rhine, PAC

## 2018-11-20 NOTE — Progress Notes (Signed)
PROGRESS NOTE    Leonard [name]  DGU:[id number] DOB: 17-Jun-1950 DOA: 11/15/2018 PCP: Kathrene Alu, MD   Brief Narrative: Leonard [name] is a 68 y.o. 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 Patient evaluated by GI, IR and general surgery with recommendations for palliative care discussions. Complicated by thrombocytopenia. Patient with a hemoglobin of 5.4-5.8 on admission and is s/p 5 units of PRBC. Hemoglobin stable from yesterday. Patient does not look at his stools. Not a radiation candidate per Radiation Oncology. Patient has expressed that he is not inclined to undergo surgery for his condition. -Palliative care recommendations -Radiation oncology recommendations pending. Discussed consult on 10/15  Pancytopenia In setting of malignancy. Currently stable except for continually dropping hemoglobin secondary to above  Gastric adenocarcinoma Patient followed by medical oncology, radiation oncology, general surgery as an outpatient. Currently not receiving chemotherapy secondary to high risk. He has received radiation therapy which appears to have been tolerated well; course was completed on 7/16.  CKD stage IV Creatinine stable. -Continue to watch Creatinine -Avoid nephrotoxic agents/dose adjust medications  Diabetes mellitus, type 2 -Continue SSI  Essential hypertension Uncontrolled. -Continue Imdur daily, torsemide daily, Carvedilol BID, amlodipine daily, hydralazine prn  History of CAD -Holding aspirin  PVD S/p left AKA Noted.   DVT prophylaxis: SCDs Code Status:   Code Status: Full Code Family Communication: Called sister with no reply Disposition Plan: Discharge  pending specialist recommendations   Consultants:   General surgery  Gastroenterology  Palliative care medicine  Interventional radiology  Radiation oncology  Procedures:   10/12: 2 units PRBC  10/13: 1 unit PRBC  10/15: 1 unit PRBC  10/16: 1 unit PRBC  Antimicrobials:  None    Subjective: No issues overnight.  Objective: Vitals:   11/19/18 1500 11/19/18 2123 11/20/18 0446 11/20/18 0729  BP: (!) 144/60 (!) 155/63 (!) 161/69   Pulse: 77 69 71   Resp: 20 14 17    Temp: 98.6 F (37 C) 98.5 F (36.9 C) 98.1 F (36.7 C)   TempSrc: Oral Oral Oral   SpO2: 96% 94% 98% 96%  Weight:      Height:        Intake/Output Summary (Last 24 hours) at 11/20/2018 1148 Last data filed at 11/20/2018 0600 Gross per 24 hour  Intake 1815 ml  Output 2625 ml  Net -810 ml   Filed Weights   11/16/18 1957  Weight: 81 kg    Examination:  General exam: Appears calm and comfortable Respiratory system: Clear to auscultation. Respiratory effort normal. Cardiovascular system: S1 & S2 heard, RRR. No murmurs, rubs, gallops or clicks. Gastrointestinal system: Abdomen is nondistended, soft and nontender. No organomegaly or masses felt. Normal bowel sounds heard. Central nervous system: Alert and oriented. No focal neurological deficits. Extremities: No edema. No calf tenderness Skin: No cyanosis. No rashes Psychiatry: Judgement and insight appear normal. Mood & affect appropriate.     Data Reviewed: I have personally reviewed following labs and imaging studies  CBC: Recent Labs  Lab 11/18/18 0349 11/19/18 0353 11/19/18 0952 11/19/18 1515 11/20/18 0901  WBC 4.0 5.7 5.5 6.2 4.9  HGB 6.8* 6.7* 6.7* 7.4* 7.5*  HCT 21.6* 20.9* 21.7* 23.2*  23.3*  MCV 93.1 93.3 92.7 91.3 91.7  PLT 84* 82* 78* 86* 86*   Basic Metabolic Panel: Recent Labs  Lab 11/17/18 0314 11/18/18 0349 11/19/18 0353 11/19/18 0952 11/20/18 0901  NA 142 140 139 141 143  K 3.9 3.6 3.3* 3.7 3.7  CL 114*  114* 112* 114* 115*  CO2 21* 19* 21* 21* 20*  GLUCOSE 109* 135* 131* 127* 129*  BUN 51* 52* 62* 63* 64*  CREATININE 2.38* 2.95* 2.98* 2.97* 2.82*  CALCIUM 7.7* 7.7* 7.6* 7.7* 7.9*   GFR: Estimated Creatinine Clearance: 24.6 mL/min (A) (by C-G formula based on SCr of 2.82 mg/dL (H)). Liver Function Tests: Recent Labs  Lab 11/15/18 1627  AST 17  ALT 13  ALKPHOS 96  BILITOT 0.7  PROT 5.6*  ALBUMIN 2.9*   No results for input(s): LIPASE, AMYLASE in the last 168 hours. No results for input(s): AMMONIA in the last 168 hours. Coagulation Profile: No results for input(s): INR, PROTIME in the last 168 hours. Cardiac Enzymes: No results for input(s): CKTOTAL, CKMB, CKMBINDEX, TROPONINI in the last 168 hours. BNP (last 3 results) No results for input(s): PROBNP in the last 8760 hours. HbA1C: No results for input(s): HGBA1C in the last 72 hours. CBG: Recent Labs  Lab 11/19/18 0739 11/19/18 1156 11/19/18 1652 11/19/18 2126 11/20/18 0737  GLUCAP 110* 127* 145* 114* 108*   Lipid Profile: No results for input(s): CHOL, HDL, LDLCALC, TRIG, CHOLHDL, LDLDIRECT in the last 72 hours. Thyroid Function Tests: No results for input(s): TSH, T4TOTAL, FREET4, T3FREE, THYROIDAB in the last 72 hours. Anemia Panel: No results for input(s): VITAMINB12, FOLATE, FERRITIN, TIBC, IRON, RETICCTPCT in the last 72 hours. Sepsis Labs: No results for input(s): PROCALCITON, LATICACIDVEN in the last 168 hours.  Recent Results (from the past 240 hour(s))  SARS Coronavirus 2 by RT PCR (hospital order, performed in [facility] hospital lab) Nasopharyngeal Nasopharyngeal Swab     Status: None   Collection Time: 11/15/18  5:58 PM   Specimen: Nasopharyngeal Swab  Result Value Ref Range Status   SARS Coronavirus 2 NEGATIVE NEGATIVE Final    Comment: (NOTE) If result is NEGATIVE SARS-CoV-2 target nucleic acids are NOT DETECTED. The SARS-CoV-2 RNA is generally detectable in upper and lower  respiratory  specimens during the acute phase of infection. The lowest  concentration of SARS-CoV-2 viral copies this assay can detect is 250  copies / mL. A negative result does not preclude SARS-CoV-2 infection  and should not be used as the sole basis for treatment or other  patient management decisions.  A negative result may occur with  improper specimen collection / handling, submission of specimen other  than nasopharyngeal swab, presence of viral mutation(s) within the  areas targeted by this assay, and inadequate number of viral copies  (<250 copies / mL). A negative result must be combined with clinical  observations, patient history, and epidemiological information. If result is POSITIVE SARS-CoV-2 target nucleic acids are DETECTED. The SARS-CoV-2 RNA is generally detectable in upper and lower  respiratory specimens dur ing the acute phase of infection.  Positive  results are indicative of active infection with SARS-CoV-2.  Clinical  correlation with patient history and other diagnostic information is  necessary to determine patient infection status.  Positive results do  not rule out bacterial infection or co-infection with other viruses. If result is PRESUMPTIVE POSTIVE SARS-CoV-2 nucleic acids MAY BE PRESENT.   A presumptive positive result was obtained on the submitted specimen  and confirmed  on repeat testing.  While 2019 novel coronavirus  (SARS-CoV-2) nucleic acids may be present in the submitted sample  additional confirmatory testing may be necessary for epidemiological  and / or clinical management purposes  to differentiate between  SARS-CoV-2 and other Sarbecovirus currently known to infect humans.  If clinically indicated additional testing with an alternate test  methodology [phone]) is advised. The SARS-CoV-2 RNA is generally  detectable in upper and lower respiratory sp ecimens during the acute  phase of infection. The expected result is Negative. Fact Sheet for  Patients:  StrictlyIdeas.no Fact Sheet for Healthcare Providers: BankingDealers.co.za This test is not yet approved or cleared by the [place] FDA and has been authorized for detection and/or diagnosis of SARS-CoV-2 by FDA under an Emergency Use Authorization (EUA).  This EUA will remain in effect (meaning this test can be used) for the duration of the COVID-19 declaration under Section 564(b)(1) of the Act, 21 U.S.C. section 360bbb-3(b)(1), unless the authorization is terminated or revoked sooner. Performed at [facility], [place] [address] [place], [place] [address]   MRSA PCR Screening     Status: None   Collection Time: 11/16/18  8:00 PM   Specimen: Nasopharyngeal  Result Value Ref Range Status   MRSA by PCR NEGATIVE NEGATIVE Final    Comment:        The GeneXpert MRSA Assay (FDA approved for NASAL specimens only), is one component of a comprehensive MRSA colonization surveillance program. It is not intended to diagnose MRSA infection nor to guide or monitor treatment for MRSA infections. Performed at [facility], [place] [address] [place], [place] [address]          Radiology Studies: No results found.      Scheduled Meds: . sodium chloride   Intravenous Once  . sodium chloride   Intravenous Once  . amLODipine  10 mg Oral Daily  . atorvastatin  40 mg Oral q1800  . carvedilol  12.5 mg Oral BID WC  . doxazosin  8 mg Oral QPM  . ferrous sulfate  325 mg Oral Daily  . folic acid  1 mg Oral Daily  . hydrALAZINE  100 mg Oral TID  . insulin aspart  0-9 Units Subcutaneous TID WC  . isosorbide mononitrate  120 mg Oral Daily  . levothyroxine  100 mcg Oral Q0600  . mouth rinse  15 mL Mouth Rinse BID  . pantoprazole (PROTONIX) IV  40 mg Intravenous QHS  . torsemide  40 mg Oral Daily  . umeclidinium bromide  1 puff Inhalation Daily   Continuous Infusions:   LOS: 5 days      Cordelia Poche, MD [facility] 11/20/2018, 11:48 AM  If 7PM-7AM, please contact night-coverage [email or web address]

## 2018-11-21 LAB — PREPARE RBC (CROSSMATCH)

## 2018-11-21 LAB — CBC
HCT: 21.8 % — ABNORMAL LOW (ref 39.0–52.0)
Hemoglobin: 6.9 g/dL — CL (ref 13.0–17.0)
MCH: 29.1 pg (ref 26.0–34.0)
MCHC: 31.7 g/dL (ref 30.0–36.0)
MCV: 92 fL (ref 80.0–100.0)
Platelets: 87 10*3/uL — ABNORMAL LOW (ref 150–400)
RBC: 2.37 MIL/uL — ABNORMAL LOW (ref 4.22–5.81)
RDW: 15.4 % (ref 11.5–15.5)
WBC: 3.7 10*3/uL — ABNORMAL LOW (ref 4.0–10.5)
nRBC: 0 % (ref 0.0–0.2)

## 2018-11-21 LAB — GLUCOSE, CAPILLARY
Glucose-Capillary: 206 mg/dL — ABNORMAL HIGH (ref 70–99)
Glucose-Capillary: 136 mg/dL — ABNORMAL HIGH (ref 70–99)
Glucose-Capillary: 147 mg/dL — ABNORMAL HIGH (ref 70–99)
Glucose-Capillary: 130 mg/dL — ABNORMAL HIGH (ref 70–99)
Glucose-Capillary: 109 mg/dL — ABNORMAL HIGH (ref 70–99)

## 2018-11-21 MED ORDER — SODIUM CHLORIDE 0.9% IV SOLUTION: Freq: Once | INTRAVENOUS | Status: DC

## 2018-11-21 NOTE — Progress Notes (Addendum)
CRITICAL VALUE ALERT  Critical Value:  Hgb 6.9  Date & Time Notied:  11/21/18  0600  Provider Notified: Texted Schorr  Orders Received/Actions taken: pending

## 2018-11-21 NOTE — Progress Notes (Signed)
PROGRESS NOTE    Leonard [name]  KYH:[id number] DOB: 1951-01-19 DOA: 11/15/2018 PCP: Kathrene Alu, MD   Brief Narrative: Leonard [name] is a 68 y.o. 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 Patient evaluated by GI, IR and general surgery with recommendations for palliative care discussions. Complicated by thrombocytopenia. Patient with a hemoglobin of 5.4-5.8 on admission and is s/p 5 units of PRBC. Hemoglobin dropped again after one day of stability. Patient does not look at his stools. Not a radiation candidate per Radiation Oncology. Patient has expressed that he is not inclined to undergo surgery for his condition. Discussed goals with patient. Expressed that current management strategy is not sustainable with how frequently he is requiring blood transfusions. Impressed upon him that this is likely a life-limiting process -Palliative care recommendations: PCM will see in AM -Radiation oncology recommendations pending. Discussed consult on 10/15 -1 unit of PRBC ordered for today  Pancytopenia In setting of malignancy. Currently stable except for continually dropping hemoglobin secondary to above  Gastric adenocarcinoma Patient followed by medical oncology, radiation oncology, general surgery as an outpatient. Currently not receiving chemotherapy secondary to high risk. He has received radiation therapy which appears to have been tolerated well; course was completed on 7/16.  CKD stage IV Creatinine stable. -Continue to watch Creatinine -Avoid nephrotoxic agents/dose adjust medications  Diabetes mellitus, type 2 -Continue SSI  Essential hypertension Uncontrolled. -Continue Imdur  daily, torsemide daily, Carvedilol BID, amlodipine daily, hydralazine prn  History of CAD -Holding aspirin  PVD S/p left AKA Noted.   DVT prophylaxis: SCDs Code Status:   Code Status: Full Code Family Communication: None at bedside Disposition Plan: Discharge pending stabilization of bleeding and goals of care discussions   Consultants:   General surgery  Gastroenterology  Palliative care medicine  Interventional radiology  Radiation oncology  Procedures:   10/12: 2 units PRBC  10/13: 1 unit PRBC  10/15: 1 unit PRBC  10/16: 1 unit PRBC  10/18: 1 unit PRBC  Antimicrobials:  None    Subjective: No bowel movement per patient.  Objective: Vitals:   11/20/18 2058 11/21/18 0444 11/21/18 0638 11/21/18 0809  BP: 137/61 130/65 (!) 147/58   Pulse: 67 65 70   Resp: 18 18 20    Temp: 98.2 F (36.8 C) 98.7 F (37.1 C) 98.4 F (36.9 C)   TempSrc:  Oral Oral   SpO2: 98% 97% 96% 96%  Weight:      Height:        Intake/Output Summary (Last 24 hours) at 11/21/2018 1200 Last data filed at 11/21/2018 0600 Gross per 24 hour  Intake 120 ml  Output 1850 ml  Net -1730 ml   Filed Weights   11/16/18 1957  Weight: 81 kg    Examination:  General exam: Appears calm and comfortable Respiratory system: Clear to auscultation. Respiratory effort normal. Cardiovascular system: S1 & S2 heard, RRR. No murmurs, rubs, gallops or clicks. Gastrointestinal system: Abdomen is nondistended, soft and nontender. No organomegaly or masses felt. Normal bowel sounds heard. Central nervous system: Alert and oriented. No focal neurological deficits. Extremities: No edema. No calf tenderness Skin: No cyanosis. No rashes Psychiatry: Judgement and insight appear normal.  Mood & affect appropriate.    Data Reviewed: I have personally reviewed following labs and imaging studies  CBC: Recent Labs  Lab 11/19/18 0353 11/19/18 0952 11/19/18 1515 11/20/18 0901 11/21/18 0408  WBC  5.7 5.5 6.2 4.9 3.7*  HGB 6.7* 6.7* 7.4* 7.5* 6.9*  HCT 20.9* 21.7* 23.2* 23.3* 21.8*  MCV 93.3 92.7 91.3 91.7 92.0  PLT 82* 78* 86* 86* 87*   Basic Metabolic Panel: Recent Labs  Lab 11/17/18 0314 11/18/18 0349 11/19/18 0353 11/19/18 0952 11/20/18 0901  NA 142 140 139 141 143  K 3.9 3.6 3.3* 3.7 3.7  CL 114* 114* 112* 114* 115*  CO2 21* 19* 21* 21* 20*  GLUCOSE 109* 135* 131* 127* 129*  BUN 51* 52* 62* 63* 64*  CREATININE 2.38* 2.95* 2.98* 2.97* 2.82*  CALCIUM 7.7* 7.7* 7.6* 7.7* 7.9*   GFR: Estimated Creatinine Clearance: 24.6 mL/min (A) (by C-G formula based on SCr of 2.82 mg/dL (H)). Liver Function Tests: Recent Labs  Lab 11/15/18 1627  AST 17  ALT 13  ALKPHOS 96  BILITOT 0.7  PROT 5.6*  ALBUMIN 2.9*   No results for input(s): LIPASE, AMYLASE in the last 168 hours. No results for input(s): AMMONIA in the last 168 hours. Coagulation Profile: No results for input(s): INR, PROTIME in the last 168 hours. Cardiac Enzymes: No results for input(s): CKTOTAL, CKMB, CKMBINDEX, TROPONINI in the last 168 hours. BNP (last 3 results) No results for input(s): PROBNP in the last 8760 hours. HbA1C: No results for input(s): HGBA1C in the last 72 hours. CBG: Recent Labs  Lab 11/20/18 0737 11/20/18 1210 11/20/18 1645 11/20/18 2055 11/21/18 0837  GLUCAP 108* 172* 145* 206* 136*   Lipid Profile: No results for input(s): CHOL, HDL, LDLCALC, TRIG, CHOLHDL, LDLDIRECT in the last 72 hours. Thyroid Function Tests: No results for input(s): TSH, T4TOTAL, FREET4, T3FREE, THYROIDAB in the last 72 hours. Anemia Panel: No results for input(s): VITAMINB12, FOLATE, FERRITIN, TIBC, IRON, RETICCTPCT in the last 72 hours. Sepsis Labs: No results for input(s): PROCALCITON, LATICACIDVEN in the last 168 hours.  Recent Results (from the past 240 hour(s))  SARS Coronavirus 2 by RT PCR (hospital order, performed in [facility] hospital lab) Nasopharyngeal Nasopharyngeal Swab     Status: None    Collection Time: 11/15/18  5:58 PM   Specimen: Nasopharyngeal Swab  Result Value Ref Range Status   SARS Coronavirus 2 NEGATIVE NEGATIVE Final    Comment: (NOTE) If result is NEGATIVE SARS-CoV-2 target nucleic acids are NOT DETECTED. The SARS-CoV-2 RNA is generally detectable in upper and lower  respiratory specimens during the acute phase of infection. The lowest  concentration of SARS-CoV-2 viral copies this assay can detect is 250  copies / mL. A negative result does not preclude SARS-CoV-2 infection  and should not be used as the sole basis for treatment or other  patient management decisions.  A negative result may occur with  improper specimen collection / handling, submission of specimen other  than nasopharyngeal swab, presence of viral mutation(s) within the  areas targeted by this assay, and inadequate number of viral copies  (<250 copies / mL). A negative result must be combined with clinical  observations, patient history, and epidemiological information. If result is POSITIVE SARS-CoV-2 target nucleic acids are DETECTED. The SARS-CoV-2 RNA is generally detectable in upper and lower  respiratory specimens dur ing the acute phase of infection.  Positive  results are indicative of active infection with SARS-CoV-2.  Clinical  correlation with patient  history and other diagnostic information is  necessary to determine patient infection status.  Positive results do  not rule out bacterial infection or co-infection with other viruses. If result is PRESUMPTIVE POSTIVE SARS-CoV-2 nucleic acids MAY BE PRESENT.   A presumptive positive result was obtained on the submitted specimen  and confirmed on repeat testing.  While 2019 novel coronavirus  (SARS-CoV-2) nucleic acids may be present in the submitted sample  additional confirmatory testing may be necessary for epidemiological  and / or clinical management purposes  to differentiate between  SARS-CoV-2 and other Sarbecovirus  currently known to infect humans.  If clinically indicated additional testing with an alternate test  methodology [phone] is advised. The SARS-CoV-2 RNA is generally  detectable in upper and lower respiratory sp ecimens during the acute  phase of infection. The expected result is Negative. Fact Sheet for Patients:  StrictlyIdeas.no Fact Sheet for Healthcare Providers: BankingDealers.co.za This test is not yet approved or cleared by the [place] FDA and has been authorized for detection and/or diagnosis of SARS-CoV-2 by FDA under an Emergency Use Authorization (EUA).  This EUA will remain in effect (meaning this test can be used) for the duration of the COVID-19 declaration under Section 564(b)(1) of the Act, 21 U.S.C. section 360bbb-3(b)(1), unless the authorization is terminated or revoked sooner. Performed at [facility], [place] [address], [place], [place] [address]   MRSA PCR Screening     Status: None   Collection Time: 11/16/18  8:00 PM   Specimen: Nasopharyngeal  Result Value Ref Range Status   MRSA by PCR NEGATIVE NEGATIVE Final    Comment:        The GeneXpert MRSA Assay (FDA approved for NASAL specimens only), is one component of a comprehensive MRSA colonization surveillance program. It is not intended to diagnose MRSA infection nor to guide or monitor treatment for MRSA infections. Performed at [facility], [place] [address] [place], [place] [address]          Radiology Studies: No results found.      Scheduled Meds: . sodium chloride   Intravenous Once  . sodium chloride   Intravenous Once  . sodium chloride   Intravenous Once  . amLODipine  10 mg Oral Daily  . atorvastatin  40 mg Oral q1800  . carvedilol  12.5 mg Oral BID WC  . doxazosin  8 mg Oral QPM  . ferrous sulfate  325 mg Oral Daily  . folic acid  1 mg Oral Daily  . hydrALAZINE  100 mg Oral TID  .  insulin aspart  0-9 Units Subcutaneous TID WC  . isosorbide mononitrate  120 mg Oral Daily  . levothyroxine  100 mcg Oral Q0600  . mouth rinse  15 mL Mouth Rinse BID  . pantoprazole (PROTONIX) IV  40 mg Intravenous QHS  . torsemide  40 mg Oral Daily  . umeclidinium bromide  1 puff Inhalation Daily   Continuous Infusions:   LOS: 6 days     Cordelia Poche, MD [facility] 11/21/2018, 12:00 PM  If 7PM-7AM, please contact night-coverage [email or web address]

## 2018-11-22 LAB — CBC
HCT: 22.2 % — ABNORMAL LOW (ref 39.0–52.0)
HCT: 21.3 % — ABNORMAL LOW (ref 39.0–52.0)
Hemoglobin: 7 g/dL — ABNORMAL LOW (ref 13.0–17.0)
Hemoglobin: 6.9 g/dL — CL (ref 13.0–17.0)
MCH: 28.7 pg (ref 26.0–34.0)
MCH: 29.2 pg (ref 26.0–34.0)
MCHC: 31.5 g/dL (ref 30.0–36.0)
MCHC: 32.4 g/dL (ref 30.0–36.0)
MCV: 91 fL (ref 80.0–100.0)
MCV: 90.3 fL (ref 80.0–100.0)
Platelets: 87 10*3/uL — ABNORMAL LOW (ref 150–400)
Platelets: 94 10*3/uL — ABNORMAL LOW (ref 150–400)
RBC: 2.44 MIL/uL — ABNORMAL LOW (ref 4.22–5.81)
RBC: 2.36 MIL/uL — ABNORMAL LOW (ref 4.22–5.81)
RDW: 15.7 % — ABNORMAL HIGH (ref 11.5–15.5)
RDW: 15.9 % — ABNORMAL HIGH (ref 11.5–15.5)
WBC: 4.2 10*3/uL (ref 4.0–10.5)
WBC: 4.2 10*3/uL (ref 4.0–10.5)
nRBC: 0 % (ref 0.0–0.2)
nRBC: 0 % (ref 0.0–0.2)

## 2018-11-22 LAB — BASIC METABOLIC PANEL
Anion gap: 9 (ref 5–15)
BUN: 64 mg/dL — ABNORMAL HIGH (ref 8–23)
CO2: 21 mmol/L — ABNORMAL LOW (ref 22–32)
Calcium: 7.6 mg/dL — ABNORMAL LOW (ref 8.9–10.3)
Chloride: 111 mmol/L (ref 98–111)
Creatinine, Ser: 3.06 mg/dL — ABNORMAL HIGH (ref 0.61–1.24)
GFR calc Af Amer: 23 mL/min — ABNORMAL LOW (ref >60)
GFR calc non Af Amer: 20 mL/min — ABNORMAL LOW (ref >60)
Glucose, Bld: 214 mg/dL — ABNORMAL HIGH (ref 70–99)
Potassium: 3.4 mmol/L — ABNORMAL LOW (ref 3.5–5.1)
Sodium: 141 mmol/L (ref 135–145)

## 2018-11-22 LAB — GLUCOSE, CAPILLARY
Glucose-Capillary: 112 mg/dL — ABNORMAL HIGH (ref 70–99)
Glucose-Capillary: 124 mg/dL — ABNORMAL HIGH (ref 70–99)
Glucose-Capillary: 183 mg/dL — ABNORMAL HIGH (ref 70–99)
Glucose-Capillary: 110 mg/dL — ABNORMAL HIGH (ref 70–99)

## 2018-11-22 LAB — PREPARE RBC (CROSSMATCH)

## 2018-11-22 MED ORDER — SODIUM CHLORIDE 0.9% IV SOLUTION: Freq: Once | INTRAVENOUS | Status: DC

## 2018-11-22 MED ORDER — POTASSIUM CHLORIDE CRYS ER 20 MEQ PO TBCR
40.0000 meq | EXTENDED_RELEASE_TABLET | Freq: Once | ORAL | Status: AC
  Administered 2018-11-22: 40 meq via ORAL

## 2018-11-22 MED ORDER — PANTOPRAZOLE SODIUM 40 MG PO TBEC
40.0000 mg | DELAYED_RELEASE_TABLET | Freq: Every day | ORAL | Status: DC
  Administered 2018-11-22 – 2018-11-23 (×2): 40 mg via ORAL
  Filled 2018-11-22 (×2): qty 1

## 2018-11-22 MED ORDER — LORAZEPAM 2 MG/ML IJ SOLN: 0.5000 mg | INTRAMUSCULAR | Status: DC | PRN

## 2018-11-22 NOTE — Progress Notes (Signed)
Patient's plan to discharge with hospice noted. General surgery will sign off, please call us if we can be of further assistance.  Wellington [place], [facility] 11/22/2018, 4:30 PM Please see Amion for pager number during day hours 7:00am-4:30pm

## 2018-11-22 NOTE — NC FL2 (Signed)
[place] LEVEL OF CARE SCREENING TOOL     IDENTIFICATION  [name] Birthdate: 02-22-1950 Sex: male Admission Date (Current Location): 11/15/2018  [place] and [place] Number:  Kathleen Argue [id number] [place] and Address:  [facility],  [place], [place]      Provider Number: [id number]  Attending Physician Name and Address:  Mariel Aloe, MD  Relative Name and Phone Number:  Sedrick Tober [facility] Recommended Level of Care: Nursing Facility(Long Term Care) Prior Approval Number:    Date Approved/Denied:   PASRR Number: [id number]  Discharge Plan: Other (Comment)(Nursing Home-LTC)    Current Diagnoses: Patient Active Problem List   Diagnosis Date Noted  . GI bleed 11/16/2018  . Acute anemia 11/15/2018  . 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 Symptomatic anemia 07/20/2018  . Hematemesis/vomiting blood 07/15/2018  . Acute blood loss anemia 07/15/2018  . Anemia due to chronic blood loss   . Upper GI bleed 06/23/2018  . Acute renal failure superimposed on chronic kidney disease ([place])   . Abdominal pain   . DNR (do not resuscitate)   . 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 DNR (do not resuscitate) discussion   . Anemia 03/29/2018  . Acute GI bleeding 03/28/2018  . Dysphagia 10/01/2017  . Palliative care by specialist   . Severe comorbid illness   . Moderate malnutrition ([place]) 08/27/2017  . Hypoxemia   . Atelectasis   . Chronic respiratory failure with hypoxia ([place]) 08/25/2017  . Pulmonary artery hypertension ([place]) 07/25/2017  . Pressure injury of skin 07/20/2017  . Goals of care, counseling/discussion 02/11/2017  . Diabetic polyneuropathy associated with type 2 diabetes mellitus ([place]) 01/14/2017  . Pleural effusion   . Chronic anemia 02/28/2016  . Weakness generalized 01/21/2016  . Diabetic retinopathy ([place]) 11/13/2015  . Iron deficiency  anemia 08/03/2015  . Type II diabetes mellitus with complication ([place])   . Chronic kidney disease (CKD), stage III (moderate)   . MGUS (monoclonal gammopathy of unknown significance) 01/15/2015  . Deficiency anemia 01/15/2015  . CKD (chronic kidney disease) stage 4, GFR 15-29 ml/min (HCC) 01/15/2015  . Hypothyroidism 11/22/2014  . COPD GOLD III  02/16/2014  . Anemia of renal disease   . Chronic diastolic CHF (congestive heart failure) ([place])   . History of tobacco use 08/23/2013  . 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 CAD (coronary artery disease) 04/06/2013  . PVD - hx of Rt SFA PTA and s/p Rt 4-5th toe amp Dec 2014 04/05/2013  . 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 Mixed hyperlipidemia 07/17/2008    Orientation RESPIRATION BLADDER Height & Weight     Self, Time, Situation, Place  Normal Incontinent(condom catheter) Weight: 81 kg Height:  5\' 8"  (172.7 cm)  BEHAVIORAL SYMPTOMS/MOOD NEUROLOGICAL BOWEL NUTRITION STATUS      Continent Diet(Regular)  AMBULATORY STATUS COMMUNICATION OF NEEDS Skin   Extensive Assist(L BKA) Verbally Normal                       Personal Care Assistance Level of Assistance  Bathing, Feeding, Dressing, Total care           Functional Limitations Info  Sight, Hearing, Speech Sight Info: Adequate Hearing Info: Adequate Speech Info: Adequate    SPECIAL CARE FACTORS FREQUENCY  Contractures Contractures Info: Not present    Additional Factors Info  Code Status, Allergies Code Status Info: full code Allergies Info: penicillins           Current Medications (11/22/2018):  This is the current hospital active medication list Current Facility-Administered Medications  Medication Dose Route Frequency Provider Last Rate Last Dose  . amLODipine (NORVASC) tablet 10 mg  10 mg Oral Daily Mariel Aloe, MD   10 mg at 11/22/18 0712  . atorvastatin (LIPITOR) tablet 40 mg  40 mg Oral q1800 Tu, Ching T, DO   40 mg at 11/21/18  1731  . carvedilol (COREG) tablet 12.5 mg  12.5 mg Oral BID WC Tu, Ching T, DO   12.5 mg at 11/22/18 1975  . doxazosin (CARDURA) tablet 8 mg  8 mg Oral QPM Tu, Ching T, DO   8 mg at 11/21/18 1731  . ferrous sulfate tablet 325 mg  325 mg Oral Daily Tu, Ching T, DO   325 mg at 11/22/18 8832  . folic acid (FOLVITE) tablet 1 mg  1 mg Oral Daily Tu, Ching T, DO   1 mg at 11/22/18 5498  . hydrALAZINE (APRESOLINE) tablet 100 mg  100 mg Oral TID Tu, Ching T, DO   100 mg at 11/22/18 2641  . insulin aspart (novoLOG) injection 0-9 Units  0-9 Units Subcutaneous TID WC Tu, Ching T, DO   1 Units at 11/22/18 1249  . isosorbide mononitrate (IMDUR) 24 hr tablet 120 mg  120 mg Oral Daily Tu, Ching T, DO   120 mg at 11/22/18 5830  . levothyroxine (SYNTHROID) tablet 100 mcg  100 mcg Oral Q0600 Tu, [place] T, DO   100 mcg at 11/22/18 0607  . MEDLINE mouth rinse  15 mL Mouth Rinse BID Rai, Ripudeep K, MD   15 mL at 11/20/18 2109  . pantoprazole (PROTONIX) EC tablet 40 mg  40 mg Oral QHS Mariel Aloe, MD      . torsemide [facility]) tablet 40 mg  40 mg Oral Daily Rai, Ripudeep K, MD   40 mg at 11/22/18 0921  . umeclidinium bromide (INCRUSE ELLIPTA) 62.5 MCG/INH 1 puff  1 puff Inhalation Daily Tu, Ching T, DO   1 puff at 11/21/18 0809   Facility-Administered Medications Ordered in Other Encounters  Medication Dose Route Frequency Provider Last Rate Last Dose  . 0.9 %  sodium chloride infusion (Manually program via Guardrails IV Fluids)  250 mL Intravenous Once Hayden Pedro, PA-C      . sodium chloride flush (NS) 0.9 % injection 10 mL  10 mL Intracatheter PRN Heath Lark, MD         Discharge Medications: Please see discharge summary for a list of discharge medications.  Relevant Imaging Results:  Relevant Lab Results:   Additional Information SS#[id number]  Alvira Monday Hassan Rowan, RN

## 2018-11-22 NOTE — Progress Notes (Signed)
Daily Progress Note   [name]       Date: 11/22/2018 DOB: 1950/07/13  Age: 68 y.o. MRN#: [id number] Attending Physician: Mariel Aloe, MD Primary Care Physician: Kathrene Alu, MD Admit Date: 11/15/2018  Reason for Consultation/Follow-up: Establishing goals of care  Subjective/GOC: Patient awake, alert, oriented. Asking for ginger ale. Denies pain or discomfort. No family at bedside.  Explored patients understanding of diagnoses, interventions, and plan of care. Last week, patient and family were requesting radiation oncology f/u to determine if he is a candidate for further radiation. Explained to patient rad onc determined he is not a candidate for radiation. Patient understands this. Frankly and compassionately explained poor prognosis and recommendation for hospice services to ensure comfort, peace, and dignity his remainder of days. Patient looks surprised to hear this. I ask if I am the first person to tell him this. Patient states "what, that I'm on my way out...yeah." Emotional support provided. Patient asks if he could go home with hospice--explained that this could be an option but he would need 24/7 support in the home from family or caregivers. Reassured patient that I would discuss with his family.   VM left for sister, Leonard [name].  Spoke with niece, Leonard [name] via telephone. Provided update on diagnoses, interventions, and recommendations from specialists including that he is not a candidate for further radiation. Frankly and compassionately explained prognosis and recommendation for comfort measures/hospice services. Introduced hospice philosophy and options. Leonard [name] shares that home with hospice will not be an option. We discussed hospice facility likely the  best option, so family can visit with him as he nears end-of-life. Leonard [name] is not able to visit this afternoon but plans to f/u with Leonard [name] and I tomorrow afternoon. Explained that we need to further discuss code status and MOST form, explaining that hospice services will allow nature to take course and not encourage aggressive measures at end-of-life including resuscitation. Leonard [name] confirms understanding of this and plan to f/u with me tomorrow.   Answered questions and concerns.   Length of Stay: 7  Current Medications: Scheduled Meds:  . amLODipine  10 mg Oral Daily  . atorvastatin  40 mg Oral q1800  . carvedilol  12.5 mg Oral BID WC  . doxazosin  8 mg Oral QPM  . ferrous sulfate  325  mg Oral Daily  . folic acid  1 mg Oral Daily  . hydrALAZINE  100 mg Oral TID  . insulin aspart  0-9 Units Subcutaneous TID WC  . isosorbide mononitrate  120 mg Oral Daily  . levothyroxine  100 mcg Oral Q0600  . mouth rinse  15 mL Mouth Rinse BID  . pantoprazole  40 mg Oral QHS  . torsemide  40 mg Oral Daily  . umeclidinium bromide  1 puff Inhalation Daily    Continuous Infusions:   PRN Meds:  Physical Exam Vitals signs and nursing note reviewed.  Constitutional:      General: He is awake.  HENT:     Head: Normocephalic and atraumatic.  Pulmonary:     Effort: No tachypnea, accessory muscle usage or respiratory distress.  Abdominal:     Tenderness: There is no abdominal tenderness.  Skin:    General: Skin is warm and dry.  Neurological:     Mental Status: He is alert and oriented to person, place, and time.  Psychiatric:        Attention and Perception: Attention normal.        Mood and Affect: Mood normal.        Speech: Speech normal.        Behavior: Behavior normal.        Cognition and Memory: Cognition normal.            Vital Signs: BP 138/62 (BP Location: Right Arm)   Pulse 73   Temp 98.3 F (36.8 C) (Oral)   Resp 15   Ht 5\' 8"  (1.727 m)   Wt 81 kg   SpO2 99%   BMI  27.16 kg/m  SpO2: SpO2: 99 % O2 Device: O2 Device: Room Air O2 Flow Rate: O2 Flow Rate (L/min): 0 L/min  Intake/output summary:   Intake/Output Summary (Last 24 hours) at 11/22/2018 1410 Last data filed at 11/22/2018 1000 Gross per 24 hour  Intake 1635 ml  Output 1700 ml  Net -65 ml   LBM: Last BM Date: 11/21/18 Baseline Weight: Weight: 81 kg Most recent weight: Weight: 81 kg       Palliative Assessment/Data: PPS 30%    Flowsheet Rows     Most Recent Value  Intake Tab  Referral Department  Hospitalist  Unit at Time of Referral  Med/Surg Unit  Palliative Care Primary Diagnosis  Cancer  Palliative Care Type  Return patient Palliative Care  Reason for referral  Clarify Goals of Care  Date first seen by Palliative Care  11/17/18  Clinical Assessment  Palliative Performance Scale Score  30%  Psychosocial & Spiritual Assessment  Palliative Care Outcomes  Patient/Family meeting held?  Yes  Who was at the meeting?  patient, niece, sister  Palliative Care Outcomes  Clarified goals of care, Counseled regarding hospice, Provided end of life care assistance, Provided psychosocial or spiritual support, Linked to palliative care logitudinal support, ACP counseling assistance      Patient Active Problem List   Diagnosis Date Noted  . GI bleed 11/16/2018  . Acute anemia 11/15/2018  . 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 Symptomatic anemia 07/20/2018  . Hematemesis/vomiting blood 07/15/2018  . Acute blood loss anemia 07/15/2018  . Anemia due to chronic blood loss   . Upper GI bleed 06/23/2018  . Acute renal failure superimposed on chronic kidney disease ([place])   . Abdominal pain   . DNR (do not resuscitate)   . 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  DNR (do not resuscitate) discussion   . Anemia 03/29/2018  . Acute GI bleeding 03/28/2018  . Dysphagia 10/01/2017  . Palliative care by specialist   . Severe comorbid illness   . Moderate malnutrition ([place])  08/27/2017  . Hypoxemia   . Atelectasis   . Chronic respiratory failure with hypoxia ([place]) 08/25/2017  . Pulmonary artery hypertension ([place]) 07/25/2017  . Pressure injury of skin 07/20/2017  . Goals of care, counseling/discussion 02/11/2017  . Diabetic polyneuropathy associated with type 2 diabetes mellitus ([place]) 01/14/2017  . Pleural effusion   . Chronic anemia 02/28/2016  . Weakness generalized 01/21/2016  . Diabetic retinopathy ([place]) 11/13/2015  . Iron deficiency anemia 08/03/2015  . Type II diabetes mellitus with complication ([place])   . Chronic kidney disease (CKD), stage III (moderate)   . MGUS (monoclonal gammopathy of unknown significance) 01/15/2015  . Deficiency anemia 01/15/2015  . CKD (chronic kidney disease) stage 4, GFR 15-29 ml/min (HCC) 01/15/2015  . Hypothyroidism 11/22/2014  . COPD GOLD III  02/16/2014  . Anemia of renal disease   . Chronic diastolic CHF (congestive heart failure) ([place])   . History of tobacco use 08/23/2013  . 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 CAD (coronary artery disease) 04/06/2013  . PVD - hx of Rt SFA PTA and s/p Rt 4-5th toe amp Dec 2014 04/05/2013  . 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 Mixed hyperlipidemia 07/17/2008    Palliative Care Assessment & Plan   Patient Profile: 68 y.o. male  with past medical history of diastolic CHF, gastric adenocarcinoma s/p XRT, hypertension, COPD, PVD s/p left AKA, CKD stage IV, DM type 2, GI bleeding admitted on 11/15/2018 with abnormal labs, HgB 5.8. Patient received 3 blood transfusions. Acute on chronic blood loss anemia secondary to gastric adenocarcinoma. Evaluated by GI, IR and general surgery. Patient is a poor surgical candidate and not a candidate for chemotherapy. Consultants recommending palliative medicine since he is high risk for GI/IR interventions and will not offer a permanent solution. Palliative medicine consultation for goals of care.   Assessment: Gastric adenocarcinoma GI bleeding Acute on  chronic blood loss anemia Pancytopenia CKD stage IV DM type 2 PVD s/p left AKA  Recommendations/Plan:  Palliative f/u with patient and niece regarding diagnoses, poor prognosis, and recommendation for comfort/hospice. Introduced hospice philosophy and options. Niece plans to further discuss hospice with patient's sister these evening. We plan to f/u tomorrow, 10/20 around 11:30 to further discuss hospice options, code status, and MOST form.    Code Status: FULL   Code Status Orders  (From admission, onward)         Start     Ordered   11/15/18 1927  Full code  Continuous     11/15/18 1929        Code Status History    Date Active Date Inactive Code Status Order ID Comments User Context   11/01/2018 1853 11/07/2018 1725 Full Code [id number]  Jani Gravel, MD ED   07/21/2018 0828 07/23/2018 1747 Full Code [id number]  Leonard Sax, RN Inpatient   07/20/2018 2125 07/21/2018 0828 Full Code [id number]  Elwyn Reach, MD Inpatient   07/15/2018 1754 07/16/2018 2202 Full Code [id number]  Darliss Cheney, MD Inpatient   07/06/2018 1727 07/11/2018 1833 Full Code [id number]  Matilde Haymaker, MD ED   06/24/2018 0048 06/27/2018 2122 Full Code [id number]  Benay Pike, MD Inpatient   04/01/2018 1053 04/03/2018 1739 Full Code [id number]  Dellinger, Bobby Rumpf, PA-C Inpatient   04/01/2018 1049  04/01/2018 1053 Full Code [id number]  Patriciaann Clan, DO Inpatient   03/31/2018 1729 04/01/2018 1049 DNR [id number]  Fuller [place], PA-C Inpatient   03/29/2018 0106 03/31/2018 1729 Full Code [id number]  Rise Patience, MD Inpatient   09/23/2017 1622 10/01/2017 2344 Full Code [id number]  Bufford Lope, DO Inpatient   08/25/2017 1454 08/30/2017 1930 Full Code [id number]  Sherene Sires, DO Inpatient   08/14/2017 1011 08/14/2017 2318 Partial Code [id number]  Sherene Sires, DO Inpatient   08/13/2017 2207 08/14/2017 1011 Full Code [id number]  Matilde Haymaker, MD Inpatient   08/13/2017 1546 08/13/2017 2207 DNR [id number]  Daisy Floro, DO  Inpatient   08/13/2017 1434 08/13/2017 1545 DNR [id number]  Daisy Floro, DO ED   07/19/2017 2342 08/07/2017 1847 Partial Code [id number]  Omar Person, NP Inpatient   07/05/2017 1253 07/19/2017 2342 Full Code [id number]  Bufford Lope, DO ED   03/12/2017 2004 03/18/2017 1814 Full Code [id number]  Conrad [place], MD Inpatient   12/01/2016 2050 12/04/2016 1213 Full Code [id number]  Mercy Riding, MD ED   03/22/2016 0119 03/24/2016 1639 Full Code [id number]  Rogue Bussing, MD Inpatient   01/22/2016 0021 01/24/2016 1657 Full Code [id number]  Steve Rattler, DO ED   01/14/2016 0330 01/18/2016 1456 Full Code [id number]  Verner Mould, MD Inpatient   08/27/2015 1501 09/02/2015 1730 Full Code [id number]  Bufford Lope, DO Inpatient   08/07/2015 0136 08/09/2015 2136 Full Code [id number]  Lorna Few, DO ED   06/28/2015 0717 07/02/2015 1750 Full Code [id number]  Sela Hua, MD Inpatient   06/18/2015 1616 06/22/2015 2218 Full Code [id number]  Aquilla Hacker, MD Inpatient   06/03/2015 0531 06/07/2015 2048 Full Code [id number]  Mercy Riding, MD ED   02/10/2014 1452 02/13/2014 2053 Full Code [id number]  Leone Brand, MD Inpatient   12/09/2013 0128 12/12/2013 2150 Full Code [id number]  Leonard Spikes, DO ED   04/07/2013 1243 04/20/2013 1701 Full Code [id number]  John Giovanni, PA-C Inpatient   04/05/2013 1537 04/07/2013 1243 Full Code [id number]  [place], Peter M, MD Inpatient   04/04/2013 0935 04/05/2013 1537 Full Code [id number]  Leonard Spikes, DO Inpatient   01/25/2013 1617 02/01/2013 2234 Full Code [id number]  Rosemarie Ax, MD Inpatient   01/13/2013 1317 01/14/2013 1426 Full Code [id number]  Conrad [place], MD Inpatient   01/04/2013 2227 01/07/2013 2005 Full Code [id number]  Angelica Ran, MD Inpatient   Advance Care Planning Activity       Prognosis:   Poor prognosis: < 2 weeks   Discharge Planning:  To Be Determined  Care plan was discussed with patient, Dr. Lonny Prude, RN, and niece Leonard [name])   Thank you for allowing the Palliative Medicine Team to assist in the care of this patient.   Time In: 1320- Time Out: 1400 Total Time 40 Prolonged Time Billed no      Greater than 50%  of this time was spent counseling and coordinating care related to the above assessment and plan.  Ihor Dow, DNP, FNP-C Palliative Medicine Team  Phone: [phone] Fax: [phone]  Please contact Palliative Medicine Team phone at [phone] for questions and concerns.

## 2018-11-22 NOTE — Plan of Care (Signed)
  Problem: Education: Goal: Knowledge of General Education information will improve Description: Including pain rating scale, medication(s)/side effects and non-pharmacologic comfort measures Outcome: Completed/Met   Problem: Clinical Measurements: Goal: Ability to maintain clinical measurements within normal limits will improve Outcome: Progressing Goal: Will remain free from infection Outcome: Completed/Met Goal: Diagnostic test results will improve Outcome: Completed/Met Goal: Cardiovascular complication will be avoided Outcome: Completed/Met   Problem: Activity: Goal: Risk for activity intolerance will decrease Outcome: Progressing   Problem: Nutrition: Goal: Adequate nutrition will be maintained Outcome: Progressing   Problem: Coping: Goal: Level of anxiety will decrease Outcome: Progressing   Problem: Elimination: Goal: Will not experience complications related to bowel motility Outcome: Completed/Met Goal: Will not experience complications related to urinary retention Outcome: Completed/Met   Problem: Pain Managment: Goal: General experience of comfort will improve Outcome: Progressing   Problem: Safety: Goal: Ability to remain free from injury will improve Outcome: Progressing   Problem: Skin Integrity: Goal: Risk for impaired skin integrity will decrease Outcome: Progressing

## 2018-11-22 NOTE — Progress Notes (Signed)
PROGRESS NOTE    Leonard [name]  HYI:[id number] DOB: 1950-05-31 DOA: 11/15/2018 PCP: Kathrene Alu, MD   Brief Narrative: [name] is a 68 y.o. 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 Patient evaluated by GI, IR and general surgery with recommendations for palliative care discussions. Complicated by thrombocytopenia. Patient with a hemoglobin of 5.4-5.8 on admission and is s/p 5 units of PRBC. Hemoglobin dropped again after one day of stability. Patient does not look at his stools. Not a radiation candidate per Radiation Oncology. Patient has expressed that he is not inclined to undergo surgery for his condition. Discussed goals with patient. Expressed that current management strategy is not sustainable with how frequently he is requiring blood transfusions. Impressed upon him that this is likely a life-limiting process Hemoglobin of 7 which is not reassuring after having receive 1 unit of PRBC on 10/18 -Palliative care recommendations: pending today  Pancytopenia In setting of malignancy. Currently stable except for continually dropping hemoglobin secondary to above  Gastric adenocarcinoma Patient followed by medical oncology, radiation oncology, general surgery as an outpatient. Currently not receiving chemotherapy secondary to high risk. He has received radiation therapy which appears to have been tolerated well; course was completed on 7/16.  CKD stage IV Creatinine stable. -Continue to watch Creatinine -Avoid nephrotoxic agents/dose adjust medications  Diabetes mellitus, type 2 -Continue SSI  Essential hypertension Uncontrolled. -Continue Imdur daily, torsemide daily,  Carvedilol BID, amlodipine daily, hydralazine prn  History of CAD -Holding aspirin  PVD S/p left AKA Noted.   DVT prophylaxis: SCDs Code Status:   Code Status: Full Code Family Communication: None at bedside Disposition Plan: Discharge pending stabilization of bleeding and goals of care discussions   Consultants:   General surgery  Gastroenterology  Palliative care medicine  Interventional radiology  Radiation oncology  Procedures:   10/12: 2 units PRBC  10/13: 1 unit PRBC  10/15: 1 unit PRBC  10/16: 1 unit PRBC  10/18: 1 unit PRBC  Antimicrobials:  None    Subjective: Bowel movement yesterday. He states the nurse told him is was dark  Objective: Vitals:   11/21/18 1622 11/21/18 2007 11/21/18 2045 11/22/18 0614  BP: (!) 143/56 130/62 (!) 149/58 (!) 165/49  Pulse: 72 68 66 73  Resp:  16 18 16   Temp: 98.3 F (36.8 C) 98.2 F (36.8 C) 98 F (36.7 C) 98.3 F (36.8 C)  TempSrc: Oral Oral Oral Oral  SpO2: 100% 99% 100% 97%  Weight:      Height:        Intake/Output Summary (Last 24 hours) at 11/22/2018 1108 Last data filed at 11/22/2018 1000 Gross per 24 hour  Intake 1635 ml  Output 1700 ml  Net -65 ml   Filed Weights   11/16/18 1957  Weight: 81 kg    Examination:  General exam: Appears calm and comfortable Respiratory system: Clear to auscultation. Respiratory effort normal. Cardiovascular system: S1 & S2 heard, RRR. No murmurs, rubs, gallops or clicks. Gastrointestinal system: Abdomen is nondistended, soft and nontender. No organomegaly or masses felt. Normal bowel sounds heard. Central nervous system: Alert and oriented. No focal neurological deficits. Extremities: No edema. No calf tenderness. Left  AKA Skin: No cyanosis. No rashes Psychiatry: Judgement and insight appear normal. Mood & affect appropriate.    Data Reviewed: I have personally reviewed following labs and imaging studies  CBC: Recent Labs  Lab 11/19/18 0952  11/19/18 1515 11/20/18 0901 11/21/18 0408 11/22/18 0330  WBC 5.5 6.2 4.9 3.7* 4.2  HGB 6.7* 7.4* 7.5* 6.9* 7.0*  HCT 21.7* 23.2* 23.3* 21.8* 22.2*  MCV 92.7 91.3 91.7 92.0 91.0  PLT 78* 86* 86* 87* 87*   Basic Metabolic Panel: Recent Labs  Lab 11/17/18 0314 11/18/18 0349 11/19/18 0353 11/19/18 0952 11/20/18 0901  NA 142 140 139 141 143  K 3.9 3.6 3.3* 3.7 3.7  CL 114* 114* 112* 114* 115*  CO2 21* 19* 21* 21* 20*  GLUCOSE 109* 135* 131* 127* 129*  BUN 51* 52* 62* 63* 64*  CREATININE 2.38* 2.95* 2.98* 2.97* 2.82*  CALCIUM 7.7* 7.7* 7.6* 7.7* 7.9*   GFR: Estimated Creatinine Clearance: 24.6 mL/min (A) (by C-G formula based on SCr of 2.82 mg/dL (H)). Liver Function Tests: Recent Labs  Lab 11/15/18 1627  AST 17  ALT 13  ALKPHOS 96  BILITOT 0.7  PROT 5.6*  ALBUMIN 2.9*   No results for input(s): LIPASE, AMYLASE in the last 168 hours. No results for input(s): AMMONIA in the last 168 hours. Coagulation Profile: No results for input(s): INR, PROTIME in the last 168 hours. Cardiac Enzymes: No results for input(s): CKTOTAL, CKMB, CKMBINDEX, TROPONINI in the last 168 hours. BNP (last 3 results) No results for input(s): PROBNP in the last 8760 hours. HbA1C: No results for input(s): HGBA1C in the last 72 hours. CBG: Recent Labs  Lab 11/21/18 0837 11/21/18 1154 11/21/18 1656 11/21/18 2136 11/22/18 0733  GLUCAP 136* 147* 130* 109* 112*   Lipid Profile: No results for input(s): CHOL, HDL, LDLCALC, TRIG, CHOLHDL, LDLDIRECT in the last 72 hours. Thyroid Function Tests: No results for input(s): TSH, T4TOTAL, FREET4, T3FREE, THYROIDAB in the last 72 hours. Anemia Panel: No results for input(s): VITAMINB12, FOLATE, FERRITIN, TIBC, IRON, RETICCTPCT in the last 72 hours. Sepsis Labs: No results for input(s): PROCALCITON, LATICACIDVEN in the last 168 hours.  Recent Results (from the past 240 hour(s))  SARS Coronavirus 2 by RT PCR (hospital order, performed in [facility]  hospital lab) Nasopharyngeal Nasopharyngeal Swab     Status: None   Collection Time: 11/15/18  5:58 PM   Specimen: Nasopharyngeal Swab  Result Value Ref Range Status   SARS Coronavirus 2 NEGATIVE NEGATIVE Final    Comment: (NOTE) If result is NEGATIVE SARS-CoV-2 target nucleic acids are NOT DETECTED. The SARS-CoV-2 RNA is generally detectable in upper and lower  respiratory specimens during the acute phase of infection. The lowest  concentration of SARS-CoV-2 viral copies this assay can detect is 250  copies / mL. A negative result does not preclude SARS-CoV-2 infection  and should not be used as the sole basis for treatment or other  patient management decisions.  A negative result may occur with  improper specimen collection / handling, submission of specimen other  than nasopharyngeal swab, presence of viral mutation(s) within the  areas targeted by this assay, and inadequate number of viral copies  (<250 copies / mL). A negative result must be combined with clinical  observations, patient history, and epidemiological information. If result is POSITIVE SARS-CoV-2 target nucleic acids are DETECTED. The SARS-CoV-2 RNA is generally detectable in upper and lower  respiratory specimens dur ing the acute phase of infection.  Positive  results are  indicative of active infection with SARS-CoV-2.  Clinical  correlation with patient history and other diagnostic information is  necessary to determine patient infection status.  Positive results do  not rule out bacterial infection or co-infection with other viruses. If result is PRESUMPTIVE POSTIVE SARS-CoV-2 nucleic acids MAY BE PRESENT.   A presumptive positive result was obtained on the submitted specimen  and confirmed on repeat testing.  While 2019 novel coronavirus  (SARS-CoV-2) nucleic acids may be present in the submitted sample  additional confirmatory testing may be necessary for epidemiological  and / or clinical management  purposes  to differentiate between  SARS-CoV-2 and other Sarbecovirus currently known to infect humans.  If clinically indicated additional testing with an alternate test  methodology [phone]) is advised. The SARS-CoV-2 RNA is generally  detectable in upper and lower respiratory sp ecimens during the acute  phase of infection. The expected result is Negative. Fact Sheet for Patients:  StrictlyIdeas.no Fact Sheet for Healthcare Providers: BankingDealers.co.za This test is not yet approved or cleared by the [place] FDA and has been authorized for detection and/or diagnosis of SARS-CoV-2 by FDA under an Emergency Use Authorization (EUA).  This EUA will remain in effect (meaning this test can be used) for the duration of the COVID-19 declaration under Section 564(b)(1) of the Act, 21 U.S.C. section 360bbb-3(b)(1), unless the authorization is terminated or revoked sooner. Performed at [facility], [place] [address] [place], [place] [address]   MRSA PCR Screening     Status: None   Collection Time: 11/16/18  8:00 PM   Specimen: Nasopharyngeal  Result Value Ref Range Status   MRSA by PCR NEGATIVE NEGATIVE Final    Comment:        The GeneXpert MRSA Assay (FDA approved for NASAL specimens only), is one component of a comprehensive MRSA colonization surveillance program. It is not intended to diagnose MRSA infection nor to guide or monitor treatment for MRSA infections. Performed at [facility], [place] [address] [place], [place] [address]          Radiology Studies: No results found.      Scheduled Meds: . sodium chloride   Intravenous Once  . sodium chloride   Intravenous Once  . sodium chloride   Intravenous Once  . amLODipine  10 mg Oral Daily  . atorvastatin  40 mg Oral q1800  . carvedilol  12.5 mg Oral BID WC  . doxazosin  8 mg Oral QPM  . ferrous sulfate  325 mg Oral Daily   . folic acid  1 mg Oral Daily  . hydrALAZINE  100 mg Oral TID  . insulin aspart  0-9 Units Subcutaneous TID WC  . isosorbide mononitrate  120 mg Oral Daily  . levothyroxine  100 mcg Oral Q0600  . mouth rinse  15 mL Mouth Rinse BID  . pantoprazole (PROTONIX) IV  40 mg Intravenous QHS  . torsemide  40 mg Oral Daily  . umeclidinium bromide  1 puff Inhalation Daily   Continuous Infusions:   LOS: 7 days     Cordelia Poche, MD [facility] 11/22/2018, 11:08 AM  If 7PM-7AM, please contact night-coverage [email or web address]

## 2018-11-22 NOTE — Progress Notes (Signed)
CRITICAL VALUE ALERT  Critical Value:  Hgb 6.9  Date & Time Notied:  11/22/2018 1751  Provider Notified: Dr. Lonny Prude  Orders Received/Actions taken:

## 2018-11-22 NOTE — Care Management Important Message (Signed)
Important Message  Patient Details IM Letter given to [name] RN to present to the [name] MRN: [id number] Date of Birth: [birth year]   Medicare Important Message Given:  Yes     Kerin Salen 11/22/2018, 11:40 AM

## 2018-11-23 LAB — BASIC METABOLIC PANEL
Anion gap: 9 (ref 5–15)
BUN: 61 mg/dL — ABNORMAL HIGH (ref 8–23)
CO2: 20 mmol/L — ABNORMAL LOW (ref 22–32)
Calcium: 7.5 mg/dL — ABNORMAL LOW (ref 8.9–10.3)
Chloride: 112 mmol/L — ABNORMAL HIGH (ref 98–111)
Creatinine, Ser: 2.86 mg/dL — ABNORMAL HIGH (ref 0.61–1.24)
GFR calc Af Amer: 25 mL/min — ABNORMAL LOW (ref >60)
GFR calc non Af Amer: 22 mL/min — ABNORMAL LOW (ref >60)
Glucose, Bld: 114 mg/dL — ABNORMAL HIGH (ref 70–99)
Potassium: 3.3 mmol/L — ABNORMAL LOW (ref 3.5–5.1)
Sodium: 141 mmol/L (ref 135–145)

## 2018-11-23 LAB — CBC
HCT: 26.1 % — ABNORMAL LOW (ref 39.0–52.0)
Hemoglobin: 8.4 g/dL — ABNORMAL LOW (ref 13.0–17.0)
MCH: 29.2 pg (ref 26.0–34.0)
MCHC: 32.2 g/dL (ref 30.0–36.0)
MCV: 90.6 fL (ref 80.0–100.0)
Platelets: 90 10*3/uL — ABNORMAL LOW (ref 150–400)
RBC: 2.88 MIL/uL — ABNORMAL LOW (ref 4.22–5.81)
RDW: 14.9 % (ref 11.5–15.5)
WBC: 4.4 10*3/uL (ref 4.0–10.5)
nRBC: 0 % (ref 0.0–0.2)

## 2018-11-23 LAB — BPAM RBC
Blood Product Expiration Date: 202011032359
Blood Product Expiration Date: 202011132359
Blood Product Expiration Date: 202011162359
Blood Product Expiration Date: 202011162359
ISSUE DATE / TIME: 202010161038
ISSUE DATE / TIME: 202010181544
ISSUE DATE / TIME: 202010191947
ISSUE DATE / TIME: 202010192347
Unit Type and Rh: 1700
Unit Type and Rh: 1700
Unit Type and Rh: 1700
Unit Type and Rh: 9500

## 2018-11-23 LAB — TYPE AND SCREEN
ABO/RH(D): B NEG
Antibody Screen: NEGATIVE
Unit division: 0
Unit division: 0
Unit division: 0
Unit division: 0

## 2018-11-23 LAB — SARS CORONAVIRUS 2 (TAT 6-24 HRS): SARS Coronavirus 2: NEGATIVE

## 2018-11-23 LAB — GLUCOSE, CAPILLARY
Glucose-Capillary: 108 mg/dL — ABNORMAL HIGH (ref 70–99)
Glucose-Capillary: 167 mg/dL — ABNORMAL HIGH (ref 70–99)
Glucose-Capillary: 129 mg/dL — ABNORMAL HIGH (ref 70–99)
Glucose-Capillary: 161 mg/dL — ABNORMAL HIGH (ref 70–99)

## 2018-11-23 MED ORDER — POTASSIUM CHLORIDE CRYS ER 20 MEQ PO TBCR
20.0000 meq | EXTENDED_RELEASE_TABLET | Freq: Every day | ORAL | Status: DC
  Administered 2018-11-24: 20 meq via ORAL
  Filled 2018-11-23: qty 1

## 2018-11-23 MED ORDER — TORSEMIDE 20 MG PO TABS
40.0000 mg | ORAL_TABLET | Freq: Every day | ORAL | Status: DC
  Administered 2018-11-24: 40 mg via ORAL
  Filled 2018-11-23: qty 2

## 2018-11-23 MED ORDER — POTASSIUM CHLORIDE CRYS ER 20 MEQ PO TBCR
40.0000 meq | EXTENDED_RELEASE_TABLET | Freq: Once | ORAL | Status: AC
  Administered 2018-11-23: 40 meq via ORAL
  Filled 2018-11-23: qty 2

## 2018-11-23 NOTE — Progress Notes (Signed)
Daily Progress Note   [name]       Date: 11/23/2018 DOB: 21-May-1950  Age: 68 y.o. MRN#: [id number] Attending Physician: Mariel Aloe, MD Primary Care Physician: Kathrene Alu, MD Admit Date: 11/15/2018  Reason for Consultation/Follow-up: Establishing goals of care  Subjective/GOC: Patient awake, alert, oriented and able to participate in [place] discussion.  F/u with patient and his sister [name]) and niece Karie Kirks) at bedside.   Again discussed course of hospitalization including diagnoses, interventions, plan of care, and recommendations from specialists. Explained that Wiatt has received 8 units of blood in 7 days. Frankly and compassionately discussed poor prognosis and recommendation for comfort measures and hospice services on discharge.   Patient is saddened to hear this but seems to be accepting. The family is a spiritual family and do believe that his fate is in God's hands. [place] wishes to return to [place] with hospice services. He has lived there for over a year and appreciates staff members there. Also has friends. He would rather go to SNF with hospice than discharge to a hospice facility.   Discussed and completed MOST form with patient, niece, and sister. Decisions made for DNR/DNI, comfort measures, IVF/ABX for time trial if indicated, and NO feeding tube. Patient and family understand reasoning for DNR/DNI and no heroic measures at EOL with underlying terminal cancer. Patient tells [place] he doesn't want to be "beat up on." Discussed hospice services allowing comfort, peace, and dignity at EOL. Allow nature to take course with his cancer.   Copies of MOST form and durable DNR placed in chart and given to family.   Emphasized role of symptom  management, no further blood transfusions, and prevention of recurrent hospitalization with support of hospice at [facility].  Therapeutic listening and emotional/spiritual support provided. Family has PMT contact information.   Length of Stay: 8  Current Medications: Scheduled Meds:  . sodium chloride   Intravenous Once  . amLODipine  10 mg Oral Daily  . atorvastatin  40 mg Oral q1800  . carvedilol  12.5 mg Oral BID WC  . doxazosin  8 mg Oral QPM  . ferrous sulfate  325 mg Oral Daily  . folic acid  1 mg Oral Daily  . hydrALAZINE  100 mg Oral TID  . insulin aspart  0-9 Units  Subcutaneous TID WC  . isosorbide mononitrate  120 mg Oral Daily  . levothyroxine  100 mcg Oral Q0600  . mouth rinse  15 mL Mouth Rinse BID  . pantoprazole  40 mg Oral QHS  . [START ON 11/24/2018] torsemide  40 mg Oral Daily   And  . [START ON 11/24/2018] potassium chloride  20 mEq Oral Daily  . umeclidinium bromide  1 puff Inhalation Daily    Continuous Infusions:   PRN Meds:  Physical Exam Vitals signs and nursing note reviewed.  Constitutional:      General: He is awake.  HENT:     Head: Normocephalic and atraumatic.  Pulmonary:     Effort: No tachypnea, accessory muscle usage or respiratory distress.  Abdominal:     Tenderness: There is no abdominal tenderness.  Skin:    General: Skin is warm and dry.  Neurological:     Mental Status: He is alert and oriented to person, place, and time.  Psychiatric:        Attention and Perception: Attention normal.        Mood and Affect: Mood normal.        Speech: Speech normal.        Behavior: Behavior normal.        Cognition and Memory: Cognition normal.            Vital Signs: BP (!) 150/59 (BP Location: Right Arm)   Pulse 69   Temp 98 F (36.7 C) (Oral)   Resp 18   Ht 5\' 8"  (1.727 m)   Wt 81 kg   SpO2 97%   BMI 27.16 kg/m  SpO2: SpO2: 97 % O2 Device: O2 Device: Room Air O2 Flow Rate: O2 Flow Rate (L/min): 0 L/min  Intake/output summary:    Intake/Output Summary (Last 24 hours) at 11/23/2018 1631 Last data filed at 11/23/2018 1331 Gross per 24 hour  Intake 1838 ml  Output 1250 ml  Net 588 ml   LBM: Last BM Date: 11/21/18 Baseline Weight: Weight: 81 kg Most recent weight: Weight: 81 kg       Palliative Assessment/Data: PPS 30%    Flowsheet Rows     Most Recent Value  Intake Tab  Referral Department  Hospitalist  Unit at Time of Referral  Med/Surg Unit  Palliative Care Primary Diagnosis  Cancer  Date Notified  11/16/18  Palliative Care Type  New Palliative care  Reason for referral  Clarify Goals of Care  Date of Admission  11/15/18  Date first seen by Palliative Care  11/17/18  # of days Palliative referral response time  1 Day(s)  # of days IP prior to Palliative referral  1  Clinical Assessment  Palliative Performance Scale Score  30%  Psychosocial & Spiritual Assessment  Palliative Care Outcomes  Patient/Family meeting held?  Yes  Who was at the meeting?  patient, niece, sister  Palliative Care Outcomes  Clarified goals of care, Counseled regarding hospice, Provided end of life care assistance, Provided psychosocial or spiritual support, Changed CPR status, Completed durable DNR, Transitioned to hospice, ACP counseling assistance      Patient Active Problem List   Diagnosis Date Noted  . GI bleed 11/16/2018  . Acute anemia 11/15/2018  . Gastric adenocarcinoma ([place]) 09/18/2018  . Symptomatic anemia 07/20/2018  . Hematemesis/vomiting blood 07/15/2018  . Acute blood loss anemia 07/15/2018  . Anemia due to chronic blood loss   . Upper GI bleed 06/23/2018  . Acute renal failure superimposed  on chronic kidney disease ([place])   . Abdominal pain   . DNR (do not resuscitate)   . 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 DNR (do not resuscitate) discussion   . Anemia 03/29/2018  . Acute GI bleeding 03/28/2018  . Dysphagia 10/01/2017  . Palliative care by specialist   . Severe comorbid illness   .  Moderate malnutrition ([place]) 08/27/2017  . Hypoxemia   . Atelectasis   . Chronic respiratory failure with hypoxia ([place]) 08/25/2017  . Pulmonary artery hypertension ([place]) 07/25/2017  . Pressure injury of skin 07/20/2017  . Goals of care, counseling/discussion 02/11/2017  . Diabetic polyneuropathy associated with type 2 diabetes mellitus ([place]) 01/14/2017  . Pleural effusion   . Chronic anemia 02/28/2016  . Weakness generalized 01/21/2016  . Diabetic retinopathy ([place]) 11/13/2015  . Iron deficiency anemia 08/03/2015  . Type II diabetes mellitus with complication ([place])   . Chronic kidney disease (CKD), stage III (moderate)   . MGUS (monoclonal gammopathy of unknown significance) 01/15/2015  . Deficiency anemia 01/15/2015  . CKD (chronic kidney disease) stage 4, GFR 15-29 ml/min (HCC) 01/15/2015  . Hypothyroidism 11/22/2014  . COPD GOLD III  02/16/2014  . Anemia of renal disease   . Chronic diastolic CHF (congestive heart failure) ([place])   . History of tobacco use 08/23/2013  . 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 CAD (coronary artery disease) 04/06/2013  . PVD - hx of Rt SFA PTA and s/p Rt 4-5th toe amp Dec 2014 04/05/2013  . 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 Mixed hyperlipidemia 07/17/2008    Palliative Care Assessment & Plan   Patient Profile: 68 y.o. male  with past medical history of diastolic CHF, gastric adenocarcinoma s/p XRT, hypertension, COPD, PVD s/p left AKA, CKD stage IV, DM type 2, GI bleeding admitted on 11/15/2018 with abnormal labs, HgB 5.8. Patient received 3 blood transfusions. Acute on chronic blood loss anemia secondary to gastric adenocarcinoma. Evaluated by GI, IR and general surgery. Patient is a poor surgical candidate and not a candidate for chemotherapy. Consultants recommending palliative medicine since he is high risk for GI/IR interventions and will not offer a permanent solution. Palliative medicine consultation for goals of care.   Assessment: Gastric  adenocarcinoma GI bleeding Acute on chronic blood loss anemia Pancytopenia CKD stage IV DM type 2 PVD s/p left AKA  Recommendations/Plan:  F/u GOC with patient, niece, and sister. Patient/family understand diagnoses and unfortunate poor prognosis. Patient wishes to return to SNF with support of hospice services. Initiate comfort measures at SNF. SW notified.   Emphasized to patient and family that hospice services will not continue blood transfusions.   MOST form completed: Patient/family decisions made for DNR/DNI, comfort focused care, IVF/ABX for time trial, and NO feeding tube. Durable DNR completed. Copies of MOST and durable DNR placed in chart and given to family.    Code Status: DNR/DNI   Code Status Orders  (From admission, onward)         Start     Ordered   11/15/18 1927  Full code  Continuous     11/15/18 1929        Code Status History    Date Active Date Inactive Code Status Order ID Comments User Context   11/01/2018 1853 11/07/2018 1725 Full Code [id number]  Jani Gravel, MD ED   07/21/2018 0828 07/23/2018 1747 Full Code [id number]  Mliss Sax, RN Inpatient   07/20/2018 2125 07/21/2018 0828 Full Code [id number]  Gala Romney  L, MD Inpatient   07/15/2018 1754 07/16/2018 2202 Full Code [id number]  Darliss Cheney, MD Inpatient   07/06/2018 1727 07/11/2018 1833 Full Code [id number]  Matilde Haymaker, MD ED   06/24/2018 0048 06/27/2018 2122 Full Code [id number]  Benay Pike, MD Inpatient   04/01/2018 1053 04/03/2018 1739 Full Code [id number]  Dellinger, Bobby Rumpf, PA-C Inpatient   04/01/2018 1049 04/01/2018 1053 Full Code [id number]  Patriciaann Clan, DO Inpatient   03/31/2018 1729 04/01/2018 1049 DNR [id number]  Fuller Canada, PA-C Inpatient   03/29/2018 0106 03/31/2018 1729 Full Code [id number]  Rise Patience, MD Inpatient   09/23/2017 1622 10/01/2017 2344 Full Code [id number]  Bufford Lope, DO Inpatient   08/25/2017 1454 08/30/2017 1930 Full Code [id number]  Sherene Sires, DO  Inpatient   08/14/2017 1011 08/14/2017 2318 Partial Code [id number]  Sherene Sires, DO Inpatient   08/13/2017 2207 08/14/2017 1011 Full Code [id number]  Matilde Haymaker, MD Inpatient   08/13/2017 1546 08/13/2017 2207 DNR [id number]  Daisy Floro, DO Inpatient   08/13/2017 1434 08/13/2017 1545 DNR [id number]  Daisy Floro, DO ED   07/19/2017 2342 08/07/2017 1847 Partial Code [id number]  Omar Person, NP Inpatient   07/05/2017 1253 07/19/2017 2342 Full Code [id number]  Bufford Lope, DO ED   03/12/2017 2004 03/18/2017 1814 Full Code [id number]  Conrad Gregory, MD Inpatient   12/01/2016 2050 12/04/2016 1213 Full Code [id number]  Mercy Riding, MD ED   03/22/2016 0119 03/24/2016 1639 Full Code [id number]  Rogue Bussing, MD Inpatient   01/22/2016 0021 01/24/2016 1657 Full Code [id number]  Steve Rattler, DO ED   01/14/2016 0330 01/18/2016 1456 Full Code [id number]  Verner Mould, MD Inpatient   08/27/2015 1501 09/02/2015 1730 Full Code [id number]  Bufford Lope, DO Inpatient   08/07/2015 0136 08/09/2015 2136 Full Code [id number]  Lorna Few, DO ED   06/28/2015 0717 07/02/2015 1750 Full Code [id number]  Sela Hua, MD Inpatient   06/18/2015 1616 06/22/2015 2218 Full Code [id number]  Aquilla Hacker, MD Inpatient   06/03/2015 0531 06/07/2015 2048 Full Code [id number]  Mercy Riding, MD ED   02/10/2014 1452 02/13/2014 2053 Full Code [id number]  Leone Brand, MD Inpatient   12/09/2013 0128 12/12/2013 2150 Full Code [id number]  Coral Spikes, DO ED   04/07/2013 1243 04/20/2013 1701 Full Code [id number]  John Giovanni, PA-C Inpatient   04/05/2013 1537 04/07/2013 1243 Full Code [id number]  [place], Peter M, MD Inpatient   04/04/2013 0935 04/05/2013 1537 Full Code [id number]  Coral Spikes, DO Inpatient   01/25/2013 1617 02/01/2013 2234 Full Code [id number]  Rosemarie Ax, MD Inpatient   01/13/2013 1317 01/14/2013 1426 Full Code [id number]  Conrad Mystic, MD Inpatient   01/04/2013 2227 01/07/2013 2005 Full Code [id number]   Angelica Ran, MD Inpatient   Advance Care Planning Activity       Prognosis:   Poor prognosis: likely weeks  Discharge Planning:  [place] with Hospice  Care plan was discussed with patient, Dr. Lonny Prude, RN, niece Karie Kirks), sister [name])  Thank you for allowing the Palliative Medicine Team to assist in the care of this patient.   Time In: 1145- Time Out: 1300 Total Time 75 Prolonged Time Billed yes      Greater than 50%  of this time was spent counseling and coordinating care related to the above assessment and plan.  Ihor Dow, DNP, FNP-C Palliative Medicine Team  Phone: [phone] Fax: [phone]  Please contact Palliative Medicine Team phone at [phone] for questions and concerns.

## 2018-11-23 NOTE — Progress Notes (Addendum)
[facility])  Referral received for hospice services at [place] facility once discharged.  Pt and chart under review by [facility] MD and eligibility is pending at this time.  Called niece Karie Kirks to confirm interest, no answer, left detailed message to return my call.  [place] referral specialist will reach out to [place] MD to obtain order as well.  Plan is to d/c tomorrow.  [facility] will update once discussed with [facility]K.  Venia Carbon RN, BSN, [facility] Liaison (in [phone]  [phone], pt approved for hospice services at facility per Dr. Eulas Post.

## 2018-11-23 NOTE — Progress Notes (Signed)
PROGRESS NOTE    Leonard [name]  ZMO:[id number] DOB: Sep 03, 1950 DOA: 11/15/2018 PCP: Kathrene Alu, MD   Brief Narrative: Leonard [name] is a 68 y.o. 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 Patient evaluated by GI, IR and general surgery with recommendations for palliative care discussions. Complicated by thrombocytopenia. Patient with a hemoglobin of 5.4-5.8 on admission and is s/p 8 units of PRBC to date. Not a radiation candidate per Radiation Oncology. Patient has expressed that he is not inclined to undergo surgery for his condition. Discussed goals with patient in addition to palliative care discussions. Hospice being considered. -Palliative care recommendations: meeting planned for today -CBC in AM; watch for bloody/melenotic stools  Pancytopenia In setting of malignancy. Currently stable except for continually dropping hemoglobin secondary to above  Gastric adenocarcinoma Patient followed by medical oncology, radiation oncology, general surgery as an outpatient. Currently not receiving chemotherapy secondary to high risk. He has received radiation therapy which appears to have been tolerated well; course was completed on 7/16.  CKD stage IV Creatinine stable -Continue to watch Creatinine -Avoid nephrotoxic agents/dose adjust medications  Diabetes mellitus, type 2 -Continue SSI  Essential hypertension Uncontrolled. -Continue Imdur daily, torsemide daily, Carvedilol BID, amlodipine daily, hydralazine prn  History of CAD -Holding aspirin  PVD S/p left AKA Noted.   DVT prophylaxis: SCDs Code Status:   Code Status: Full Code Family Communication: None at bedside  Disposition Plan: Discharge pending stabilization of bleeding and/or goals of care discussions   Consultants:   General surgery  Gastroenterology  Palliative care medicine  Interventional radiology  Radiation oncology  Procedures:   10/12: 2 units PRBC  10/13: 1 unit PRBC  10/15: 1 unit PRBC  10/16: 1 unit PRBC  10/18: 1 unit PRBC  10/19: 2 units PRBC  Antimicrobials:  None    Subjective: No issues overnight.  Objective: Vitals:   11/23/18 0008 11/23/18 0244 11/23/18 0510 11/23/18 1109  BP: (!) 155/57 (!) 165/73 (!) 167/66   Pulse: 71 72 72   Resp: 19 18 18    Temp: 98.4 F (36.9 C) 98.3 F (36.8 C) 98.4 F (36.9 C)   TempSrc: Oral Oral Oral   SpO2: 100% 94% 94% 95%  Weight:      Height:        Intake/Output Summary (Last 24 hours) at 11/23/2018 1131 Last data filed at 11/23/2018 1000 Gross per 24 hour  Intake 2078 ml  Output 1400 ml  Net 678 ml   Filed Weights   11/16/18 1957  Weight: 81 kg    Examination:  General exam: Appears calm and comfortable Respiratory system: Clear to auscultation. Respiratory effort normal. Cardiovascular system: S1 & S2 heard, RRR. No murmurs, rubs, gallops or clicks. Gastrointestinal system: Abdomen is nondistended, soft and nontender. No organomegaly or masses felt. Normal bowel sounds heard. Central nervous system: Alert and oriented. No focal neurological deficits. Extremities: Left AKA. No calf tenderness Skin: No cyanosis. No rashes Psychiatry: Judgement and insight appear normal. Mood & affect appropriate.    Data Reviewed: I have personally reviewed following labs and imaging studies  CBC: Recent Labs  Lab 11/20/18 0901 11/21/18 0408 11/22/18 0330 11/22/18 1723 11/23/18 0354  WBC 4.9 3.7* 4.2 4.2 4.4  HGB 7.5* 6.9* 7.0* 6.9* 8.4*  HCT 23.3* 21.8* 22.2* 21.3* 26.1*  MCV 91.7 92.0 91.0 90.3 90.6  PLT 86* 87* 87* 94* 90*   Basic Metabolic Panel: Recent Labs  Lab 11/19/18 0353 11/19/18  0952 11/20/18 0901 11/22/18 1723 11/23/18 0416  NA 139 141 143 141 141  K 3.3* 3.7 3.7 3.4* 3.3*  CL 112* 114* 115* 111 112*  CO2 21* 21* 20* 21* 20*  GLUCOSE 131* 127* 129* 214* 114*  BUN 62* 63* 64* 64* 61*  CREATININE 2.98* 2.97* 2.82* 3.06* 2.86*  CALCIUM 7.6* 7.7* 7.9* 7.6* 7.5*   GFR: Estimated Creatinine Clearance: 24.2 mL/min (A) (by C-G formula based on SCr of 2.86 mg/dL (H)). Liver Function Tests: No results for input(s): AST, ALT, ALKPHOS, BILITOT, PROT, ALBUMIN in the last 168 hours. No results for input(s): LIPASE, AMYLASE in the last 168 hours. No results for input(s): AMMONIA in the last 168 hours. Coagulation Profile: No results for input(s): INR, PROTIME in the last 168 hours. Cardiac Enzymes: No results for input(s): CKTOTAL, CKMB, CKMBINDEX, TROPONINI in the last 168 hours. BNP (last 3 results) No results for input(s): PROBNP in the last 8760 hours. HbA1C: No results for input(s): HGBA1C in the last 72 hours. CBG: Recent Labs  Lab 11/22/18 0733 11/22/18 1145 11/22/18 1632 11/22/18 2208 11/23/18 0736  GLUCAP 112* 124* 183* 110* 108*   Lipid Profile: No results for input(s): CHOL, HDL, LDLCALC, TRIG, CHOLHDL, LDLDIRECT in the last 72 hours. Thyroid Function Tests: No results for input(s): TSH, T4TOTAL, FREET4, T3FREE, THYROIDAB in the last 72 hours. Anemia Panel: No results for input(s): VITAMINB12, FOLATE, FERRITIN, TIBC, IRON, RETICCTPCT in the last 72 hours. Sepsis Labs: No results for input(s): PROCALCITON, LATICACIDVEN in the last 168 hours.  Recent Results (from the past 240 hour(s))  SARS Coronavirus 2 by RT PCR (hospital order, performed in [facility] hospital lab) Nasopharyngeal Nasopharyngeal Swab     Status: None   Collection Time: 11/15/18  5:58 PM   Specimen: Nasopharyngeal Swab  Result Value Ref Range Status   SARS Coronavirus 2 NEGATIVE NEGATIVE Final    Comment: (NOTE) If result is NEGATIVE SARS-CoV-2 target nucleic acids are  NOT DETECTED. The SARS-CoV-2 RNA is generally detectable in upper and lower  respiratory specimens during the acute phase of infection. The lowest  concentration of SARS-CoV-2 viral copies this assay can detect is 250  copies / mL. A negative result does not preclude SARS-CoV-2 infection  and should not be used as the sole basis for treatment or other  patient management decisions.  A negative result may occur with  improper specimen collection / handling, submission of specimen other  than nasopharyngeal swab, presence of viral mutation(s) within the  areas targeted by this assay, and inadequate number of viral copies  (<250 copies / mL). A negative result must be combined with clinical  observations, patient history, and epidemiological information. If result is POSITIVE SARS-CoV-2 target nucleic acids are DETECTED. The SARS-CoV-2 RNA is generally detectable in upper and lower  respiratory specimens dur ing the acute phase of infection.  Positive  results are indicative of active infection with SARS-CoV-2.  Clinical  correlation with patient history and other diagnostic information is  necessary to determine patient infection status.  Positive results do  not rule out bacterial infection or co-infection with other viruses. If result is PRESUMPTIVE POSTIVE SARS-CoV-2 nucleic acids MAY BE PRESENT.   A presumptive positive result was  obtained on the submitted specimen  and confirmed on repeat testing.  While 2019 novel coronavirus  (SARS-CoV-2) nucleic acids may be present in the submitted sample  additional confirmatory testing may be necessary for epidemiological  and / or clinical management purposes  to differentiate between  SARS-CoV-2 and other Sarbecovirus currently known to infect humans.  If clinically indicated additional testing with an alternate test  methodology [phone]) is advised. The SARS-CoV-2 RNA is generally  detectable in upper and lower respiratory sp ecimens  during the acute  phase of infection. The expected result is Negative. Fact Sheet for Patients:  StrictlyIdeas.no Fact Sheet for Healthcare Providers: BankingDealers.co.za This test is not yet approved or cleared by the [place] FDA and has been authorized for detection and/or diagnosis of SARS-CoV-2 by FDA under an Emergency Use Authorization (EUA).  This EUA will remain in effect (meaning this test can be used) for the duration of the COVID-19 declaration under Section 564(b)(1) of the Act, 21 U.S.C. section 360bbb-3(b)(1), unless the authorization is terminated or revoked sooner. Performed at [facility], [place] [address] [place], [place] [address]   MRSA PCR Screening     Status: None   Collection Time: 11/16/18  8:00 PM   Specimen: Nasopharyngeal  Result Value Ref Range Status   MRSA by PCR NEGATIVE NEGATIVE Final    Comment:        The GeneXpert MRSA Assay (FDA approved for NASAL specimens only), is one component of a comprehensive MRSA colonization surveillance program. It is not intended to diagnose MRSA infection nor to guide or monitor treatment for MRSA infections. Performed at [facility], [place] [address] [place], [place] [address]          Radiology Studies: No results found.      Scheduled Meds: . sodium chloride   Intravenous Once  . amLODipine  10 mg Oral Daily  . atorvastatin  40 mg Oral q1800  . carvedilol  12.5 mg Oral BID WC  . doxazosin  8 mg Oral QPM  . ferrous sulfate  325 mg Oral Daily  . folic acid  1 mg Oral Daily  . hydrALAZINE  100 mg Oral TID  . insulin aspart  0-9 Units Subcutaneous TID WC  . isosorbide mononitrate  120 mg Oral Daily  . levothyroxine  100 mcg Oral Q0600  . mouth rinse  15 mL Mouth Rinse BID  . pantoprazole  40 mg Oral QHS  . torsemide  40 mg Oral Daily  . umeclidinium bromide  1 puff Inhalation Daily   Continuous  Infusions:   LOS: 8 days     Cordelia Poche, MD [facility] 11/23/2018, 11:31 AM  If 7PM-7AM, please contact night-coverage [email or web address]

## 2018-11-23 NOTE — TOC Progression Note (Signed)
Transition of Care [facility]) - Progression Note    Patient Details  Name: EARLAND REISH MRN: [id number] Date of Birth: [birth year]  Transition of [facility]) CM/SW Contact  Miryah Ralls, Juliann Pulse, RN Phone Number: 11/23/2018, 1:27 PM  Clinical Narrative: Per attending-d/c plan tomorrow return back to LTC @ [facility] w/hospice services([facility])-Left vm w/Rosalyn niece. Bevely Palmer rep for [employer] care notified.DNR will need from signed. Await covid results. Will need PTAR @ d/c.    Expected Discharge Plan: [place] Barriers to Discharge: Continued Medical Work up  Expected Discharge Plan and Services Expected Discharge Plan: [place] In-house Referral: Clinical Social Work     Living arrangements for the past 2 months: [place] Expected Discharge Date: (unknown)                                     Social Determinants of Health (SDOH) Interventions    Readmission Risk Interventions Readmission Risk Prevention Plan 11/22/2018  Transportation Screening Complete  Medication Review [employer]) Complete  PCP or Specialist appointment within 3-5 days of discharge Not Complete  PCP/Specialist Appt Not Complete comments not ready to dc, patient from [place] or [place] Complete  SW Recovery Care/Counseling Consult Complete  Palliative Care Screening Not [place] Complete  Some recent data might be hidden

## 2018-11-24 LAB — GLUCOSE, CAPILLARY: Glucose-Capillary: 140 mg/dL — ABNORMAL HIGH (ref 70–99)

## 2018-11-24 MED ORDER — LORAZEPAM 2 MG/ML PO CONC: 0.5000 mg | ORAL | Status: DC | PRN

## 2018-11-24 MED ORDER — OXYCODONE HCL 20 MG/ML PO CONC: 5.0000 mg | ORAL | Status: DC | PRN

## 2018-11-24 MED ORDER — LORAZEPAM 2 MG/ML PO CONC: 0.5000 mg | ORAL | 0 refills | Status: AC | PRN

## 2018-11-24 MED ORDER — OXYCODONE HCL 20 MG/ML PO CONC: 5.0000 mg | ORAL | 0 refills | Status: AC | PRN

## 2018-11-24 NOTE — Discharge Summary (Signed)
[facility] Discharge Summary   Patient: Leonard [name] NAT:[id number]   PCP: Kathrene Alu, MD DOB: [birth year]   Date of admission: 11/15/2018   Date of discharge:  11/24/2018    Discharge Diagnoses:  GI bleed Acute on chronic blood loss anemia Gastric adenocarcinoma CKD stage IV   Admitted From: ALF Disposition:  ALF/ILF with Hospice  Recommendations for Outpatient Follow-up:  1. PCP: establish care with hospice 2. Follow up LABS/TEST:  none  Follow-up Information    [facility].   Specialty: PALLIATIVE CARE Why: hospice services Contact information: [place] [address] [phone]         Diet recommendation: Regular diet  Activity: The patient is advised to gradually reintroduce usual activities,as tolerated.  Discharge Condition: stable  Code Status: DNR   History of present illness: As per the H and P dictated on admission, " Leonard [name] is a 68 y.o. male with medical history significant of diastolic CHF, hypertension, COPD, PVD s/p left AKA, CKD stage IV, type 2 diabetes and hx of GI bleed from gastric adenocarcinoma with hx of XRT who presented on routine lab work at [facility] with Hgb of 5.8.  He denies any lightheadedness dizziness.  No chest pain or shortness of breath.  No abdominal pain, nausea vomiting or diarrhea.  States he has not noticed any blood or dark tarry stool.  Otherwise, patient was a poor historian and unable to provide much HPI.  He was recently hospitalized from 9/28 to 10/4 for symptomatic anemia with hemoglobin down to 4.7.  Received 4 units PRBC throughout his hospitalization.  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  General surgery was also consulted.  Discharge hemoglobin of 8.3. He also underwent thoracentesis for right pleural effusion with removal of 1 L transudate of  fluid.  Patient apparently went to see general surgery today to discuss possible palliative gastrectomy versus radiation.  Patient is unsure which treatment he would like to undergo.  ED Course: He had mildly elevated temperature of 99.3 and was hypertensive up to 186/68 on room air.  CBC showed leukopenia of 3.5 and hemoglobin of 5.8 from a prior of 8.38 days ago.  Platelet at 110 which is improved from 83 8 days ago.  CMP showed low potassium of 3.3, elevated glucose of 204, creatinine of 2.56.  GI and General surgery consulted by EDP. "  Hospital Course:  Summary of his active problems in the hospital is as following. GI bleed Acute on chronic blood loss anemia In setting of gastric adenocarcinoma.  Complicated by thrombocytopenia. Patient evaluated by GI, IR and general surgery with recommendations for palliative care discussions.  Patient with a hemoglobin of 5.4-5.8 on admission and is s/p 8 units of PRBC to date.  Not a radiation candidate per Radiation Oncology.  Patient has expressed that he is not inclined to undergo surgery for his condition.  Palliative care recommendations: return to SNF with support of hospice services. Initiate comfort measures at SNF. SW notified.  Emphasized to patient and family that hospice services will not continue blood transfusions.  MOST form completed: Patient/family decisions made for DNR/DNI, comfort focused care, IVF/ABX for time trial, and NO feeding tube. Durable DNR completed. Copies of MOST and durable DNR placed in chart and given to family.   Pancytopenia In setting of malignancy. Currently stable except for continually dropping hemoglobin secondary to  above  Gastric adenocarcinoma Patient followed by medical oncology, radiation oncology, general surgery as an outpatient. Currently not receiving chemotherapy secondary to high risk. He has received radiation therapy which appears to have been tolerated well; course was completed on  7/16.  CKD stage IV Creatinine stable -Avoid nephrotoxic agents/dose adjust medications  Diabetes mellitus, type 2 No therapy on discharge. As on hospice  Essential hypertension Uncontrolled. -Continue Imdur daily, torsemide daily, Carvedilol BID, amlodipine daily, hydralazine prn  History of CAD -Holding aspirin  PVD S/p left AKA  On the day of the discharge the patient's vitals were stable, and no other acute medical condition were reported by patient. the patient was felt safe to be discharge at ALF/ILF with hospice.  Consultants:   General surgery  Gastroenterology  Palliative care medicine  Interventional radiology  Radiation oncology  Procedures:   10/12: 2 units PRBC  10/13: 1 unit PRBC  10/15: 1 unit PRBC  10/16: 1 unit PRBC  10/18: 1 unit PRBC  10/19: 2 units PRBC  DISCHARGE MEDICATION: Allergies as of 11/24/2018      Reactions   Penicillins Other (See Comments)   Has patient had a PCN reaction causing immediate rash, facial/tongue/throat swelling, SOB or lightheadedness with hypotension: Unknown Has patient had a PCN reaction causing severe rash involving mucus membranes or skin necrosis: Unknown Has patient had a PCN reaction that required hospitalization: Unknown Has patient had a PCN reaction occurring within the last 10 years: Unknown If all of the above answers are "NO", then may proceed with Cephalosporin use.      Medication List    STOP taking these medications   atorvastatin 40 MG tablet Commonly known as: LIPITOR   cloNIDine 0.2 mg/24hr patch Commonly known as: CATAPRES - Dosed in mg/24 hr   torsemide 20 MG tablet Commonly known as: DEMADEX     TAKE these medications   acetaminophen 325 MG tablet Commonly known as: TYLENOL Take 650 mg by mouth every 6 (six) hours as needed for fever.   amLODipine 5 MG tablet Commonly known as: NORVASC Take 1 tablet (5 mg total) by mouth daily.   ascorbic acid 500 MG  tablet Commonly known as: VITAMIN C Take 500 mg by mouth daily.   carvedilol 12.5 MG tablet Commonly known as: COREG Take 1 tablet (12.5 mg total) by mouth 2 (two) times daily with a meal.   cholecalciferol 25 MCG (1000 UT) tablet Commonly known as: VITAMIN D Take 2,000 Units by mouth 3 (three) times daily.   Darbepoetin Alfa 40 MCG/0.4ML Sosy injection Commonly known as: ARANESP Inject 0.4 mLs (40 mcg total) into the skin every 30 (thirty) days.   doxazosin 8 MG tablet Commonly known as: CARDURA Take 1 tablet (8 mg total) by mouth every evening. What changed: when to take this   feeding supplement (ENSURE ENLIVE) Liqd Take 237 mLs by mouth 3 (three) times daily between meals.   ferrous sulfate 325 (65 FE) MG tablet Commonly known as: FeroSul Take 1 tablet (325 mg total) by mouth daily at 12 noon. What changed: when to take this   folic acid 1 MG tablet Commonly known as: FOLVITE Take 1 tablet (1 mg total) by mouth daily.   glucose blood test strip Commonly known as: OneTouch Verio Use as instructed to test three times daily. ICD-10 code: E11.22   hydrALAZINE 100 MG tablet Commonly known as: APRESOLINE TAKE ONE TABLET BY MOUTH EVERY EIGHT HOURS What changed: See the new instructions.   insulin  aspart 100 UNIT/ML injection Commonly known as: novoLOG Inject 0-10 Units into the skin 3 (three) times daily before meals. <150= 0 units, 151-200= 2 units, 201-250= 4 units, 251-300= 6 units, 301-350= 8 units, 351-400= 10 units   isosorbide mononitrate 120 MG 24 hr tablet Commonly known as: IMDUR Take 1 tablet (120 mg total) by mouth daily.   levothyroxine 100 MCG tablet Commonly known as: SYNTHROID Take 100 mcg by mouth daily before breakfast.   Lonhala Magnair Starter Kit 25 MCG/ML Soln Generic drug: Glycopyrrolate Inhale 1 mL into the lungs 2 (two) times a day.   LORazepam 2 MG/ML concentrated solution Commonly known as: ATIVAN Take 0.3 mLs (0.6 mg total) by mouth  every 4 (four) hours as needed for anxiety or sleep.   nitroGLYCERIN 0.4 MG SL tablet Commonly known as: NITROSTAT Place 1 tablet (0.4 mg total) under the tongue every 5 (five) minutes as needed for chest pain. What changed: additional instructions   ondansetron 4 MG tablet Commonly known as: ZOFRAN Take 4 mg by mouth every 8 (eight) hours as needed for nausea or vomiting.   OneTouch Delica Lancets 46O Misc 1 each by Does not apply route 3 (three) times daily. ICD-10 code: E11.22   OneTouch Verio Flex System w/Device Kit 1 kit by Does not apply route daily.   oxyCODONE 20 MG/ML concentrated solution Commonly known as: ROXICODONE INTENSOL Take 0.3 mLs (6 mg total) by mouth every 4 (four) hours as needed for severe pain.   pantoprazole 40 MG tablet Commonly known as: PROTONIX Take 1 tablet (40 mg total) by mouth 2 (two) times daily.   polyethylene glycol 17 g packet Commonly known as: MIRALAX / GLYCOLAX Take 17 g by mouth daily as needed for mild constipation or moderate constipation.   sucralfate 1 g tablet Commonly known as: CARAFATE Take 1 tablet (1 g total) by mouth 4 (four) times daily -  with meals and at bedtime.   umeclidinium bromide 62.5 MCG/INH Aepb Commonly known as: INCRUSE ELLIPTA Inhale 1 puff into the lungs daily.      Allergies  Allergen Reactions   Penicillins Other (See Comments)    Has patient had a PCN reaction causing immediate rash, facial/tongue/throat swelling, SOB or lightheadedness with hypotension: Unknown Has patient had a PCN reaction causing severe rash involving mucus membranes or skin necrosis: Unknown Has patient had a PCN reaction that required hospitalization: Unknown Has patient had a PCN reaction occurring within the last 10 years: Unknown If all of the above answers are "NO", then may proceed with Cephalosporin use.    Discharge Instructions    Diet - low sodium heart healthy   Complete by: As directed    Increase activity  slowly   Complete by: As directed      Discharge Exam: Filed Weights   11/16/18 1957  Weight: 81 kg   Vitals:   11/24/18 0501 11/24/18 0607  BP: (!) 179/70 (!) 161/57  Pulse: 71   Resp: 18   Temp: 98.4 F (36.9 C)   SpO2: 95%    General: Appear in mild distress, no Rash; Oral Mucosa Clear, moist. no Abnormal Mass Or lumps Cardiovascular: S1 and S2 Present, no Murmur, Respiratory: normal respiratory effort, Bilateral Air entry present and Clear to Auscultation, no Crackles, no wheezes Abdomen: Bowel Sound present Extremities: no right Pedal edema, left BKA, no calf tenderness Neurology: alert and not oriented to time, place, and person affect flat in affect.  The results of significant diagnostics from  this hospitalization (including imaging, microbiology, ancillary and laboratory) are listed below for reference.    Significant Diagnostic Studies: Ct Abdomen Pelvis Wo Contrast  Result Date: 11/04/2018 CLINICAL DATA:  Gastric cancer, evaluate for metastatic disease, history of GI bleed. EXAM: CT CHEST, ABDOMEN AND PELVIS WITHOUT CONTRAST TECHNIQUE: Multidetector CT imaging of the chest, abdomen and pelvis was performed following the standard protocol without IV contrast. COMPARISON:  March 31, 2018 03/28/2018 FINDINGS: CT CHEST FINDINGS Cardiovascular: Extensive calcifications throughout the thoracic aorta with signs of calcified coronary artery disease similar to previous study. Heart size remains enlarged without pericardial effusion and without significant change from previous exam. Mild engorgement of central pulmonary vasculature. Mediastinum/Nodes: Scattered lymph nodes throughout the mediastinum, largest along the right paratracheal chain measuring 1.2 cm previously 0.8 cm. Subcarinal lymph node also showing enlargement at approximately 1.9 cm short axis, previously 1 cm. Lungs/Pleura: Patchy areas of ground-glass attenuation have developed since 03/31/2018 also with some signs  of septal thickening, basilar consolidation and small to moderate bilateral pleural effusions right greater than left Musculoskeletal: No signs of chest wall mass. Signs of median sternotomy for coronary revascularization similar to previous exams. CT ABDOMEN PELVIS FINDINGS Hepatobiliary: Limited assessment on noncontrast evaluation. No suspicious focal hepatic lesion. No signs of biliary ductal dilation or pericholecystic inflammation. Pancreas: Unremarkable. No pancreatic ductal dilatation or surrounding inflammatory changes. Spleen: Normal in size without focal abnormality. Adrenals/Urinary Tract: Normal appearance of bilateral adrenal glands. Mild perinephric stranding bilaterally without signs of hydronephrosis. Similar to prior study. Presumed cyst arises from the lower pole of left kidney. Stomach/Bowel: No signs of acute gastrointestinal process. Scattered colonic diverticulosis favoring right hemicolon. Normal appearance of the appendix. Mild gastric thickening as before, not well evaluated. 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 Bilateral common iliac arterial dilation and aneurysmal caliber is also unchanged, as measured by this observer on the current and previous study. No signs of adenopathy in the upper abdomen or retroperitoneum. No signs of pelvic lymphadenopathy. Reproductive: Prostate not well evaluated.  Grossly unremarkable. Other: Small volume ascites in the pelvis.  No signs of free air. Musculoskeletal: Sternotomy changes as above. No signs of acute musculoskeletal process. IMPRESSION: 1. Bilateral effusions right greater than left, associated with basilar consolidative changes and patchy airspace opacities as well as interstitial thickening. Could potentially represent a combination of heart failure in the background with an overlay of pneumonitis. Correlation with BNP may be helpful. 2. Enlarging lymph  nodes in the chest may be indicative of metastatic disease though in the setting of potential heart failure and/or infection remaining nonspecific at this time. 3. Cardiac enlargement with signs of generalized atherosclerosis and coronary artery disease. 4. 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 5. Ascites further supporting the potential for cardiac dysfunction or volume overload. Electronically Signed   By: Zetta Bills M.D.   On: 11/04/2018 08:16   Dg Chest 1 View  Result Date: 11/04/2018 CLINICAL DATA:  Status post right-sided thoracentesis. EXAM: CHEST  1 VIEW COMPARISON:  CT, 11/03/2018.  Radiographs, 06/23/2018. FINDINGS: Right pleural effusion is decreased following thoracentesis. There is no pneumothorax. Left pleural effusion persists. There is hazy bilateral airspace lung opacities similar to the prior chest CT. No new lung abnormalities. Stable changes from prior CABG surgery. IMPRESSION: 1. Decreased right pleural effusion following thoracentesis. No pneumothorax or evidence of a procedure complication. Electronically Signed   By: Lajean Manes M.D.   On: 11/04/2018 16:45  Ct Chest Wo Contrast  Result Date: 11/04/2018 CLINICAL DATA:  Gastric cancer, evaluate for metastatic disease, history of GI bleed. EXAM: CT CHEST, ABDOMEN AND PELVIS WITHOUT CONTRAST TECHNIQUE: Multidetector CT imaging of the chest, abdomen and pelvis was performed following the standard protocol without IV contrast. COMPARISON:  March 31, 2018 03/28/2018 FINDINGS: CT CHEST FINDINGS Cardiovascular: Extensive calcifications throughout the thoracic aorta with signs of calcified coronary artery disease similar to previous study. Heart size remains enlarged without pericardial effusion and without significant change from previous exam. Mild engorgement of central pulmonary vasculature. Mediastinum/Nodes: Scattered lymph nodes throughout the mediastinum, largest along the right paratracheal chain measuring  1.2 cm previously 0.8 cm. Subcarinal lymph node also showing enlargement at approximately 1.9 cm short axis, previously 1 cm. Lungs/Pleura: Patchy areas of ground-glass attenuation have developed since 03/31/2018 also with some signs of septal thickening, basilar consolidation and small to moderate bilateral pleural effusions right greater than left Musculoskeletal: No signs of chest wall mass. Signs of median sternotomy for coronary revascularization similar to previous exams. CT ABDOMEN PELVIS FINDINGS Hepatobiliary: Limited assessment on noncontrast evaluation. No suspicious focal hepatic lesion. No signs of biliary ductal dilation or pericholecystic inflammation. Pancreas: Unremarkable. No pancreatic ductal dilatation or surrounding inflammatory changes. Spleen: Normal in size without focal abnormality. Adrenals/Urinary Tract: Normal appearance of bilateral adrenal glands. Mild perinephric stranding bilaterally without signs of hydronephrosis. Similar to prior study. Presumed cyst arises from the lower pole of left kidney. Stomach/Bowel: No signs of acute gastrointestinal process. Scattered colonic diverticulosis favoring right hemicolon. Normal appearance of the appendix. Mild gastric thickening as before, not well evaluated. 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 Bilateral common iliac arterial dilation and aneurysmal caliber is also unchanged, as measured by this observer on the current and previous study. No signs of adenopathy in the upper abdomen or retroperitoneum. No signs of pelvic lymphadenopathy. Reproductive: Prostate not well evaluated.  Grossly unremarkable. Other: Small volume ascites in the pelvis.  No signs of free air. Musculoskeletal: Sternotomy changes as above. No signs of acute musculoskeletal process. IMPRESSION: 1. Bilateral effusions right greater than left, associated with basilar consolidative  changes and patchy airspace opacities as well as interstitial thickening. Could potentially represent a combination of heart failure in the background with an overlay of pneumonitis. Correlation with BNP may be helpful. 2. Enlarging lymph nodes in the chest may be indicative of metastatic disease though in the setting of potential heart failure and/or infection remaining nonspecific at this time. 3. Cardiac enlargement with signs of generalized atherosclerosis and coronary artery disease. 4. 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 5. Ascites further supporting the potential for cardiac dysfunction or volume overload. Electronically Signed   By: Zetta Bills M.D.   On: 11/04/2018 08:16   US Thoracentesis Asp Pleural Space W/img Guide  Result Date: 11/04/2018 INDICATION: Patient with history of gastric cancer with recent GI bleed, coronary artery disease with CABG in 2015, COPD, chronic kidney disease, bilateral pleural effusions right greater than left. Request received for diagnostic and therapeutic right thoracentesis. EXAM: ULTRASOUND GUIDED DIAGNOSTIC AND THERAPEUTIC RIGHT THORACENTESIS MEDICATIONS: None COMPLICATIONS: None immediate. PROCEDURE: An ultrasound guided thoracentesis was thoroughly discussed with the patient and questions answered. The benefits, risks, alternatives and complications were also discussed. The patient understands and wishes to proceed with the procedure. Written consent was obtained. Ultrasound was performed to localize and mark an adequate pocket of fluid in the right chest. The area  was then prepped and draped in the normal sterile fashion. 1% Lidocaine was used for local anesthesia. Under ultrasound guidance a 6 Fr Safe-T-Centesis catheter was introduced. Thoracentesis was performed. The catheter was removed and a dressing applied. FINDINGS: A total of approximately 1 liter of hazy, amber fluid was removed. Samples were sent to the laboratory as requested by  the clinical team. IMPRESSION: Successful ultrasound guided diagnostic and therapeutic right thoracentesis yielding 1 liter of pleural fluid. Read by: Rowe Robert, PA-C Electronically Signed   By: Constance Holster M.D.   On: 11/04/2018 16:35    Microbiology: Recent Results (from the past 240 hour(s))  SARS Coronavirus 2 by RT PCR (hospital order, performed in [facility] hospital lab) Nasopharyngeal Nasopharyngeal Swab     Status: None   Collection Time: 11/15/18  5:58 PM   Specimen: Nasopharyngeal Swab  Result Value Ref Range Status   SARS Coronavirus 2 NEGATIVE NEGATIVE Final    Comment: (NOTE) If result is NEGATIVE SARS-CoV-2 target nucleic acids are NOT DETECTED. The SARS-CoV-2 RNA is generally detectable in upper and lower  respiratory specimens during the acute phase of infection. The lowest  concentration of SARS-CoV-2 viral copies this assay can detect is 250  copies / mL. A negative result does not preclude SARS-CoV-2 infection  and should not be used as the sole basis for treatment or other  patient management decisions.  A negative result may occur with  improper specimen collection / handling, submission of specimen other  than nasopharyngeal swab, presence of viral mutation(s) within the  areas targeted by this assay, and inadequate number of viral copies  (<250 copies / mL). A negative result must be combined with clinical  observations, patient history, and epidemiological information. If result is POSITIVE SARS-CoV-2 target nucleic acids are DETECTED. The SARS-CoV-2 RNA is generally detectable in upper and lower  respiratory specimens dur ing the acute phase of infection.  Positive  results are indicative of active infection with SARS-CoV-2.  Clinical  correlation with patient history and other diagnostic information is  necessary to determine patient infection status.  Positive results do  not rule out bacterial infection or co-infection with other viruses. If  result is PRESUMPTIVE POSTIVE SARS-CoV-2 nucleic acids MAY BE PRESENT.   A presumptive positive result was obtained on the submitted specimen  and confirmed on repeat testing.  While 2019 novel coronavirus  (SARS-CoV-2) nucleic acids may be present in the submitted sample  additional confirmatory testing may be necessary for epidemiological  and / or clinical management purposes  to differentiate between  SARS-CoV-2 and other Sarbecovirus currently known to infect humans.  If clinically indicated additional testing with an alternate test  methodology [phone]) is advised. The SARS-CoV-2 RNA is generally  detectable in upper and lower respiratory sp ecimens during the acute  phase of infection. The expected result is Negative. Fact Sheet for Patients:  StrictlyIdeas.no Fact Sheet for Healthcare Providers: BankingDealers.co.za This test is not yet approved or cleared by the [place] FDA and has been authorized for detection and/or diagnosis of SARS-CoV-2 by FDA under an Emergency Use Authorization (EUA).  This EUA will remain in effect (meaning this test can be used) for the duration of the COVID-19 declaration under Section 564(b)(1) of the Act, 21 U.S.C. section 360bbb-3(b)(1), unless the authorization is terminated or revoked sooner. Performed at [facility], [place] [address], [place], [place] [address]   MRSA PCR Screening     Status: None   Collection Time: 11/16/18  8:00 PM   Specimen: Nasopharyngeal  Result Value Ref Range Status   MRSA by PCR NEGATIVE NEGATIVE Final    Comment:        The GeneXpert MRSA Assay (FDA approved for NASAL specimens only), is one component of a comprehensive MRSA colonization surveillance program. It is not intended to diagnose MRSA infection nor to guide or monitor treatment for MRSA infections. Performed at [facility], [place] [address], [place],  [place] [address]   SARS CORONAVIRUS 2 (TAT 6-24 HRS) Nasopharyngeal Nasopharyngeal Swab     Status: None   Collection Time: 11/23/18  2:40 PM   Specimen: Nasopharyngeal Swab  Result Value Ref Range Status   SARS Coronavirus 2 NEGATIVE NEGATIVE Final    Comment: (NOTE) SARS-CoV-2 target nucleic acids are NOT DETECTED. The SARS-CoV-2 RNA is generally detectable in upper and lower respiratory specimens during the acute phase of infection. Negative results do not preclude SARS-CoV-2 infection, do not rule out co-infections with other pathogens, and should not be used as the sole basis for treatment or other patient management decisions. Negative results must be combined with clinical observations, patient history, and epidemiological information. The expected result is Negative. Fact Sheet for Patients: SugarRoll.be Fact Sheet for Healthcare Providers: https://www.woods-mathews.com/ This test is not yet approved or cleared by the [place] FDA and  has been authorized for detection and/or diagnosis of SARS-CoV-2 by FDA under an Emergency Use Authorization (EUA). This EUA will remain  in effect (meaning this test can be used) for the duration of the COVID-19 declaration under Section 56 4(b)(1) of the Act, 21 U.S.C. section 360bbb-3(b)(1), unless the authorization is terminated or revoked sooner. Performed at [facility], [place] [address], [place], [place] [address]      Labs: CBC: Recent Labs  Lab 11/20/18 0901 11/21/18 0408 11/22/18 0330 11/22/18 1723 11/23/18 0416  WBC 4.9 3.7* 4.2 4.2 4.4  HGB 7.5* 6.9* 7.0* 6.9* 8.4*  HCT 23.3* 21.8* 22.2* 21.3* 26.1*  MCV 91.7 92.0 91.0 90.3 90.6  PLT 86* 87* 87* 94* 90*   Basic Metabolic Panel: Recent Labs  Lab 11/19/18 0353 11/19/18 0952 11/20/18 0901 11/22/18 1723 11/23/18 0416  NA 139 141 143 141 141  K 3.3* 3.7 3.7 3.4* 3.3*  CL 112* 114* 115* 111 112*  CO2 21* 21* 20* 21* 20*   GLUCOSE 131* 127* 129* 214* 114*  BUN 62* 63* 64* 64* 61*  CREATININE 2.98* 2.97* 2.82* 3.06* 2.86*  CALCIUM 7.6* 7.7* 7.9* 7.6* 7.5*   Liver Function Tests: No results for input(s): AST, ALT, ALKPHOS, BILITOT, PROT, ALBUMIN in the last 168 hours. No results for input(s): LIPASE, AMYLASE in the last 168 hours. No results for input(s): AMMONIA in the last 168 hours. Cardiac Enzymes: No results for input(s): CKTOTAL, CKMB, CKMBINDEX, TROPONINI in the last 168 hours. BNP (last 3 results) Recent Labs    12/03/17 1011 03/28/18 1758 06/23/18 2039  BNP 436.1* 171.7* 261.3*   CBG: Recent Labs  Lab 11/23/18 0736 11/23/18 1143 11/23/18 1628 11/23/18 2040 11/24/18 0737  GLUCAP 108* 167* 129* 161* 140*    Time spent: 35 minutes  Signed:  Berle Mull  [facility]  11/24/2018 10:16 AM

## 2018-11-24 NOTE — TOC Transition Note (Signed)
Transition of Care [facility]) - CM/SW Discharge Note   Patient Details  Name: Leonard [name] MRN: [id number] Date of Birth: 31-Jan-1951  Transition of Care [facility] CM/SW Contact:  Wende Neighbors, LCSW Phone Number: 11/24/2018, 11:24 AM   Clinical Narrative:  Patient to return to [facility] via [facility]. CSW contacted patient niece to make her aware of discharge. RN to call [phone] (rm# [phone]) for report. [facility] has been scheduled for pick up      Final next level of care: [place] Barriers to Discharge: No Barriers Identified   Patient Goals and CMS Choice Patient states their goals for this hospitalization and ongoing recovery are:: return to facility CMS Medicare.gov Compare Post Acute Care list provided to:: Patient    Discharge Placement              Patient chooses bed at: ([facility]) Patient to be transferred to facility by: [place] Name of family member notified: spoke to niece via phone Patient and family notified of of transfer: 11/24/18  Discharge Plan and Services In-house Referral: Clinical Social Work                                   Social Determinants of Health (SDOH) Interventions     Readmission Risk Interventions Readmission Risk Prevention Plan 11/22/2018  Transportation Screening Complete  Medication Review Press photographer) Complete  PCP or Specialist appointment within 3-5 days of discharge Not Complete  PCP/Specialist Appt Not Complete comments not ready to dc, patient from LTC facility  [place] or [place] Complete  SW Recovery Care/Counseling Consult Complete  Palliative Care Screening Not [place] Complete  Some recent data might be hidden

## 2018-11-25 DIAGNOSIS — Z20828 Contact with and (suspected) exposure to other viral communicable diseases: Secondary | ICD-10-CM | POA: Diagnosis not present

## 2018-11-26 DIAGNOSIS — C801 Malignant (primary) neoplasm, unspecified: Secondary | ICD-10-CM | POA: Diagnosis not present

## 2018-11-26 DIAGNOSIS — D002 Carcinoma in situ of stomach: Secondary | ICD-10-CM | POA: Diagnosis not present

## 2018-11-26 DIAGNOSIS — D649 Anemia, unspecified: Secondary | ICD-10-CM | POA: Diagnosis not present

## 2018-11-26 DIAGNOSIS — D500 Iron deficiency anemia secondary to blood loss (chronic): Secondary | ICD-10-CM | POA: Diagnosis not present

## 2018-11-29 DIAGNOSIS — D508 Other iron deficiency anemias: Secondary | ICD-10-CM | POA: Diagnosis not present

## 2018-11-29 DIAGNOSIS — D509 Iron deficiency anemia, unspecified: Secondary | ICD-10-CM | POA: Diagnosis not present

## 2018-11-29 DIAGNOSIS — D649 Anemia, unspecified: Secondary | ICD-10-CM | POA: Diagnosis not present

## 2018-11-29 DIAGNOSIS — D464 Refractory anemia, unspecified: Secondary | ICD-10-CM | POA: Diagnosis not present

## 2018-12-02 DIAGNOSIS — K922 Gastrointestinal hemorrhage, unspecified: Secondary | ICD-10-CM | POA: Diagnosis not present

## 2018-12-02 DIAGNOSIS — D002 Carcinoma in situ of stomach: Secondary | ICD-10-CM | POA: Diagnosis not present

## 2018-12-02 DIAGNOSIS — Z20828 Contact with and (suspected) exposure to other viral communicable diseases: Secondary | ICD-10-CM | POA: Diagnosis not present

## 2018-12-03 DIAGNOSIS — Z515 Encounter for palliative care: Secondary | ICD-10-CM | POA: Diagnosis not present

## 2018-12-03 DIAGNOSIS — Z66 Do not resuscitate: Secondary | ICD-10-CM | POA: Diagnosis not present

## 2018-12-03 DIAGNOSIS — K922 Gastrointestinal hemorrhage, unspecified: Secondary | ICD-10-CM | POA: Diagnosis not present

## 2018-12-03 DIAGNOSIS — C801 Malignant (primary) neoplasm, unspecified: Secondary | ICD-10-CM | POA: Diagnosis not present

## 2018-12-05 DEATH — deceased

## 2018-12-09 ENCOUNTER — Telehealth: Payer: Self-pay | Admitting: Radiation Oncology

## 2019-01-04 NOTE — Telephone Encounter (Signed)
I left a message for the patient's niece to tell her how sorry we were to hear of Mr. Blansett passing and pass along Dr. Ida Rogue condolences as well.

## 2019-01-04 DEATH — deceased

## 2019-01-19 IMAGING — DX DG CHEST 1V PORT
2 series · 2 of 2 positions shown · non-contrast
Comparison: Portable exam 8211 hours compared to 07/23/2017

CLINICAL DATA: Respiratory failure, CHF, hypertension

EXAM:
PORTABLE CHEST 1 VIEW

[chest ap (1 of 2)]
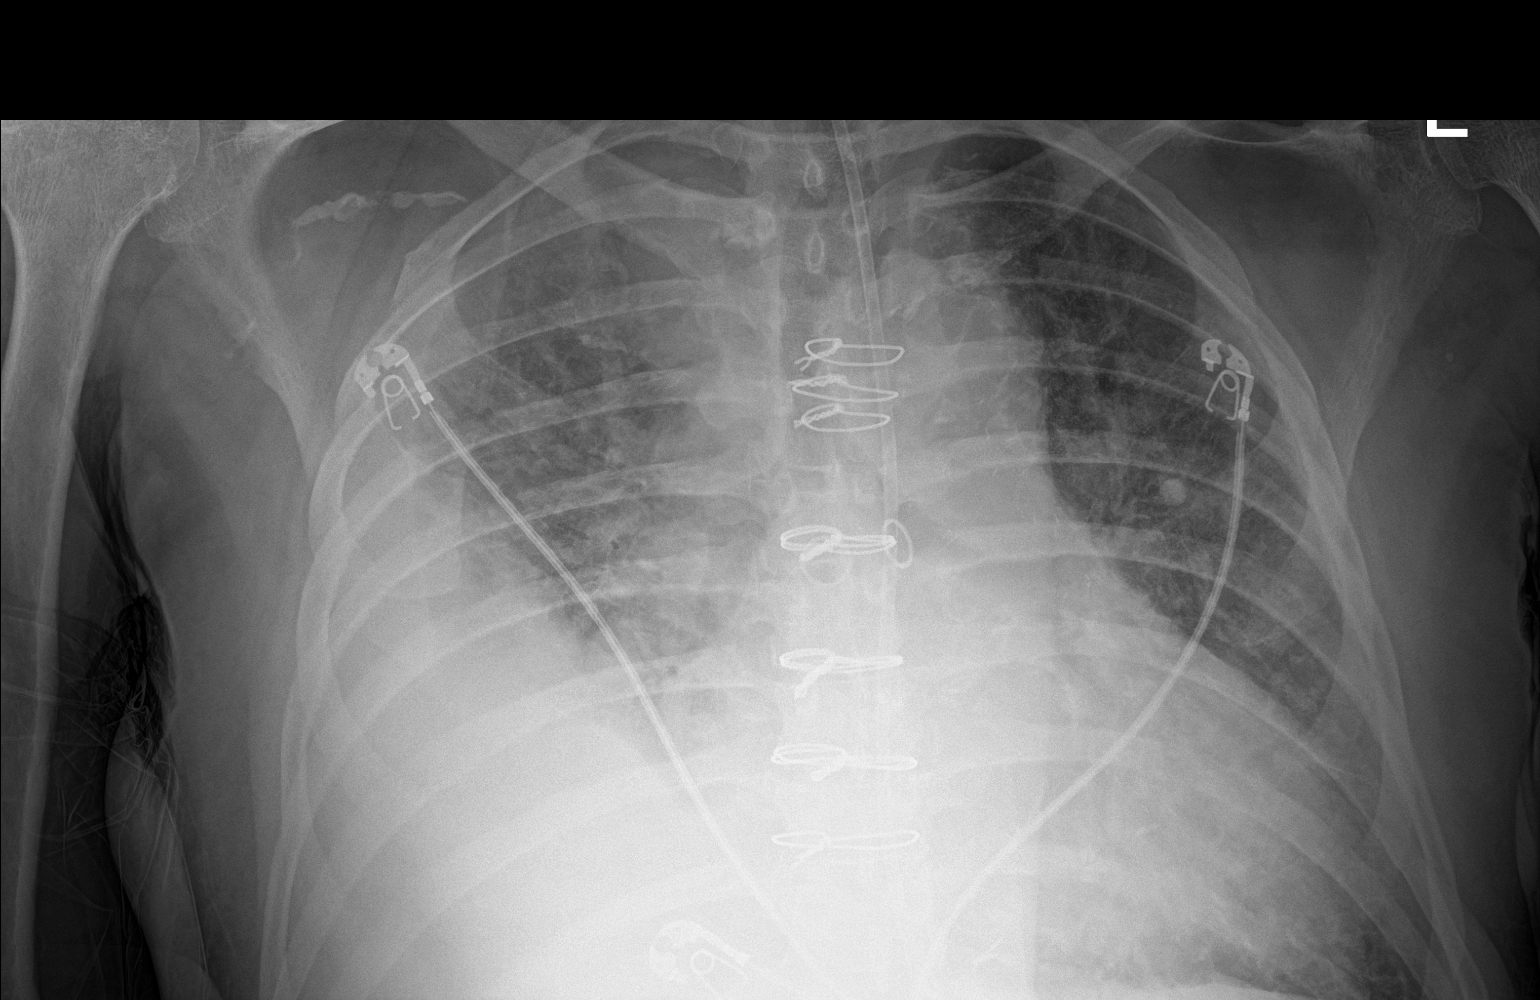

[chest ap (2 of 2)]
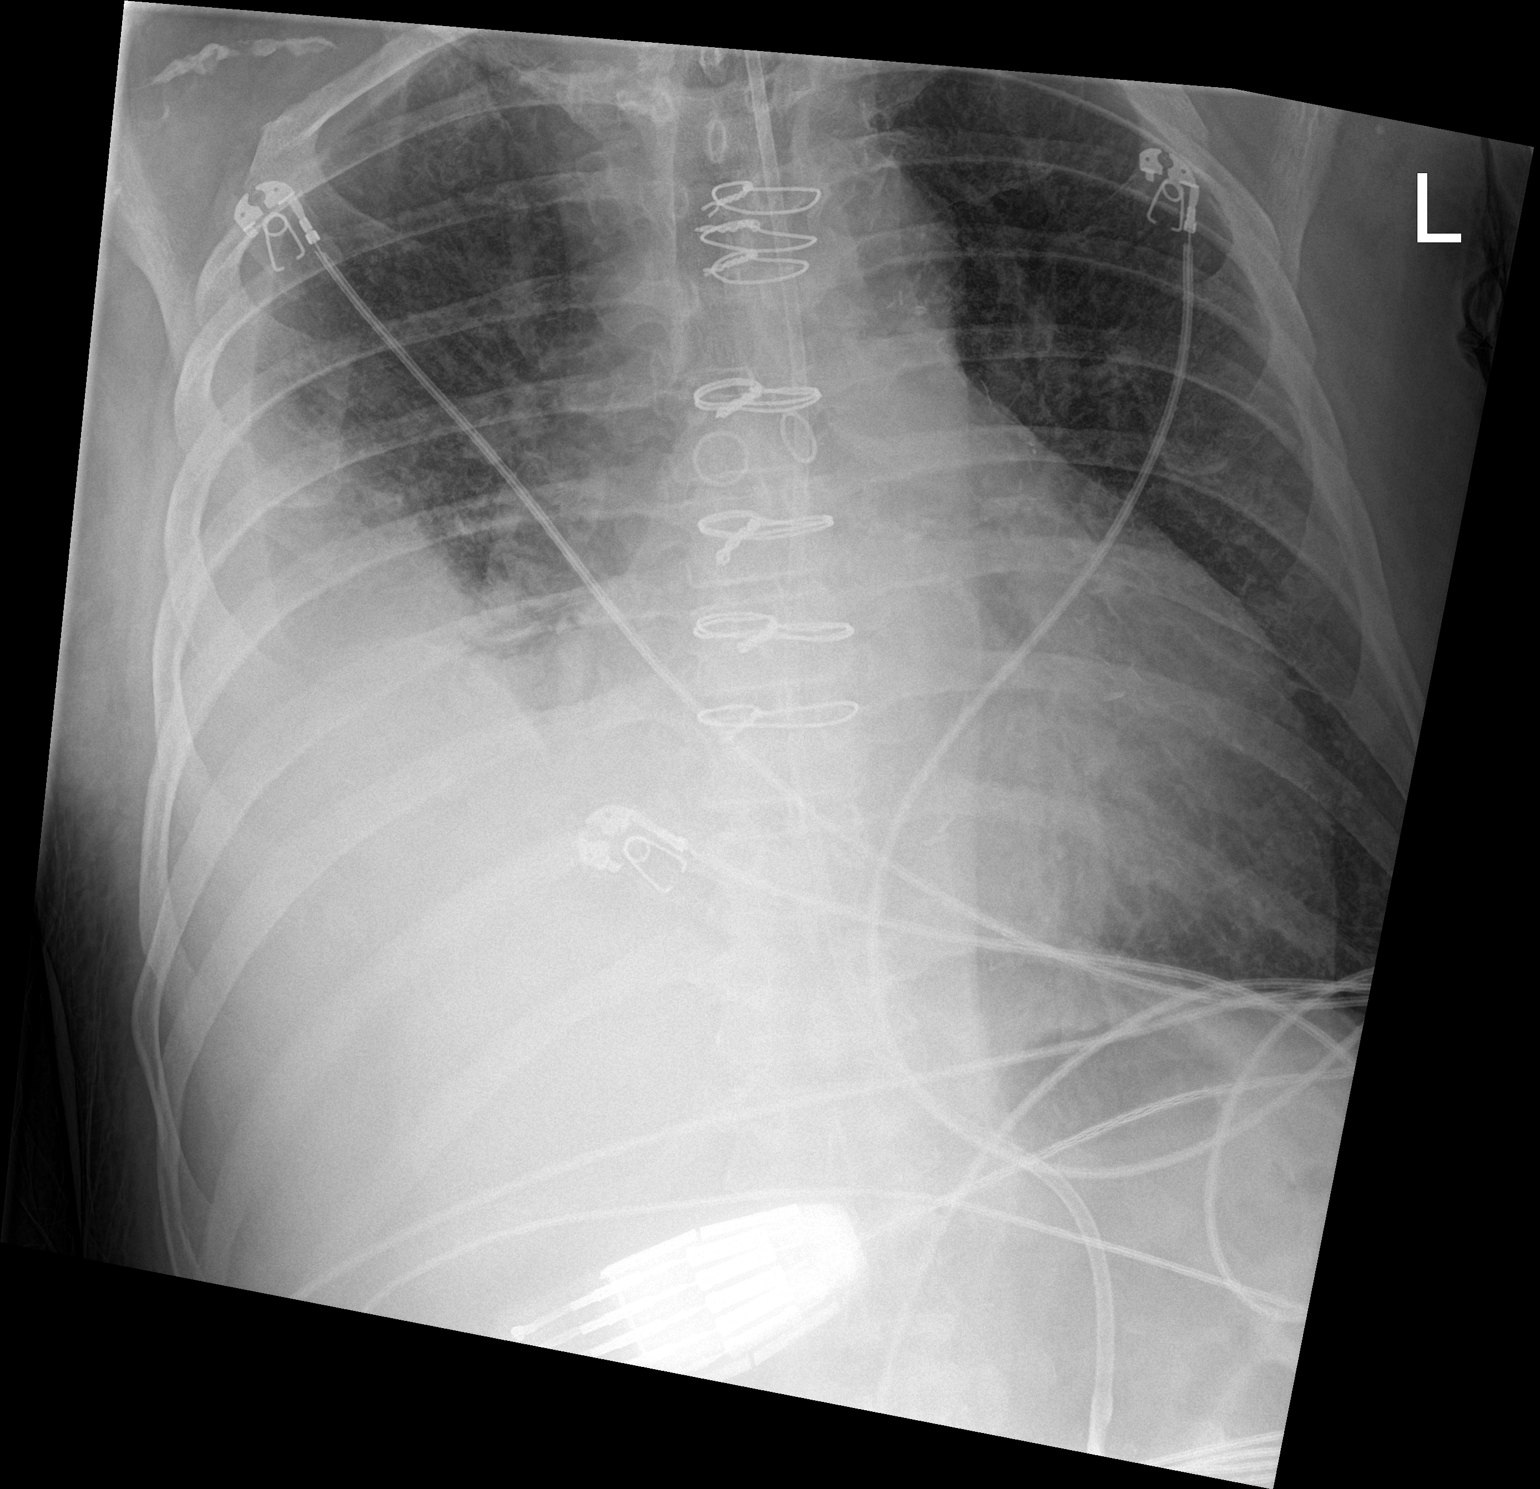

[2 of 2 positions shown; findings below may reference images not displayed]

FINDINGS: Feeding tube traverses chest into stomach.

Enlargement of cardiac silhouette post CABG.

Pulmonary vascular congestion.

Persistent pulmonary edema.

Persistent large RIGHT pleural effusion with significant atelectasis
of the RIGHT lung.

No pneumothorax.

Bones unremarkable.

Atherosclerotic calcifications aorta and RIGHT axillary artery.
IMPRESSION: Persistent CHF with pulmonary edema, large RIGHT pleural effusion
and significant RIGHT lung atelectasis.

## 2019-01-20 IMAGING — DX DG CHEST 1V PORT
1 series · 1 of 1 positions shown · non-contrast
Comparison: 07/24/2017

CLINICAL DATA: CHF.

EXAM:
PORTABLE CHEST 1 VIEW

[chest ap]
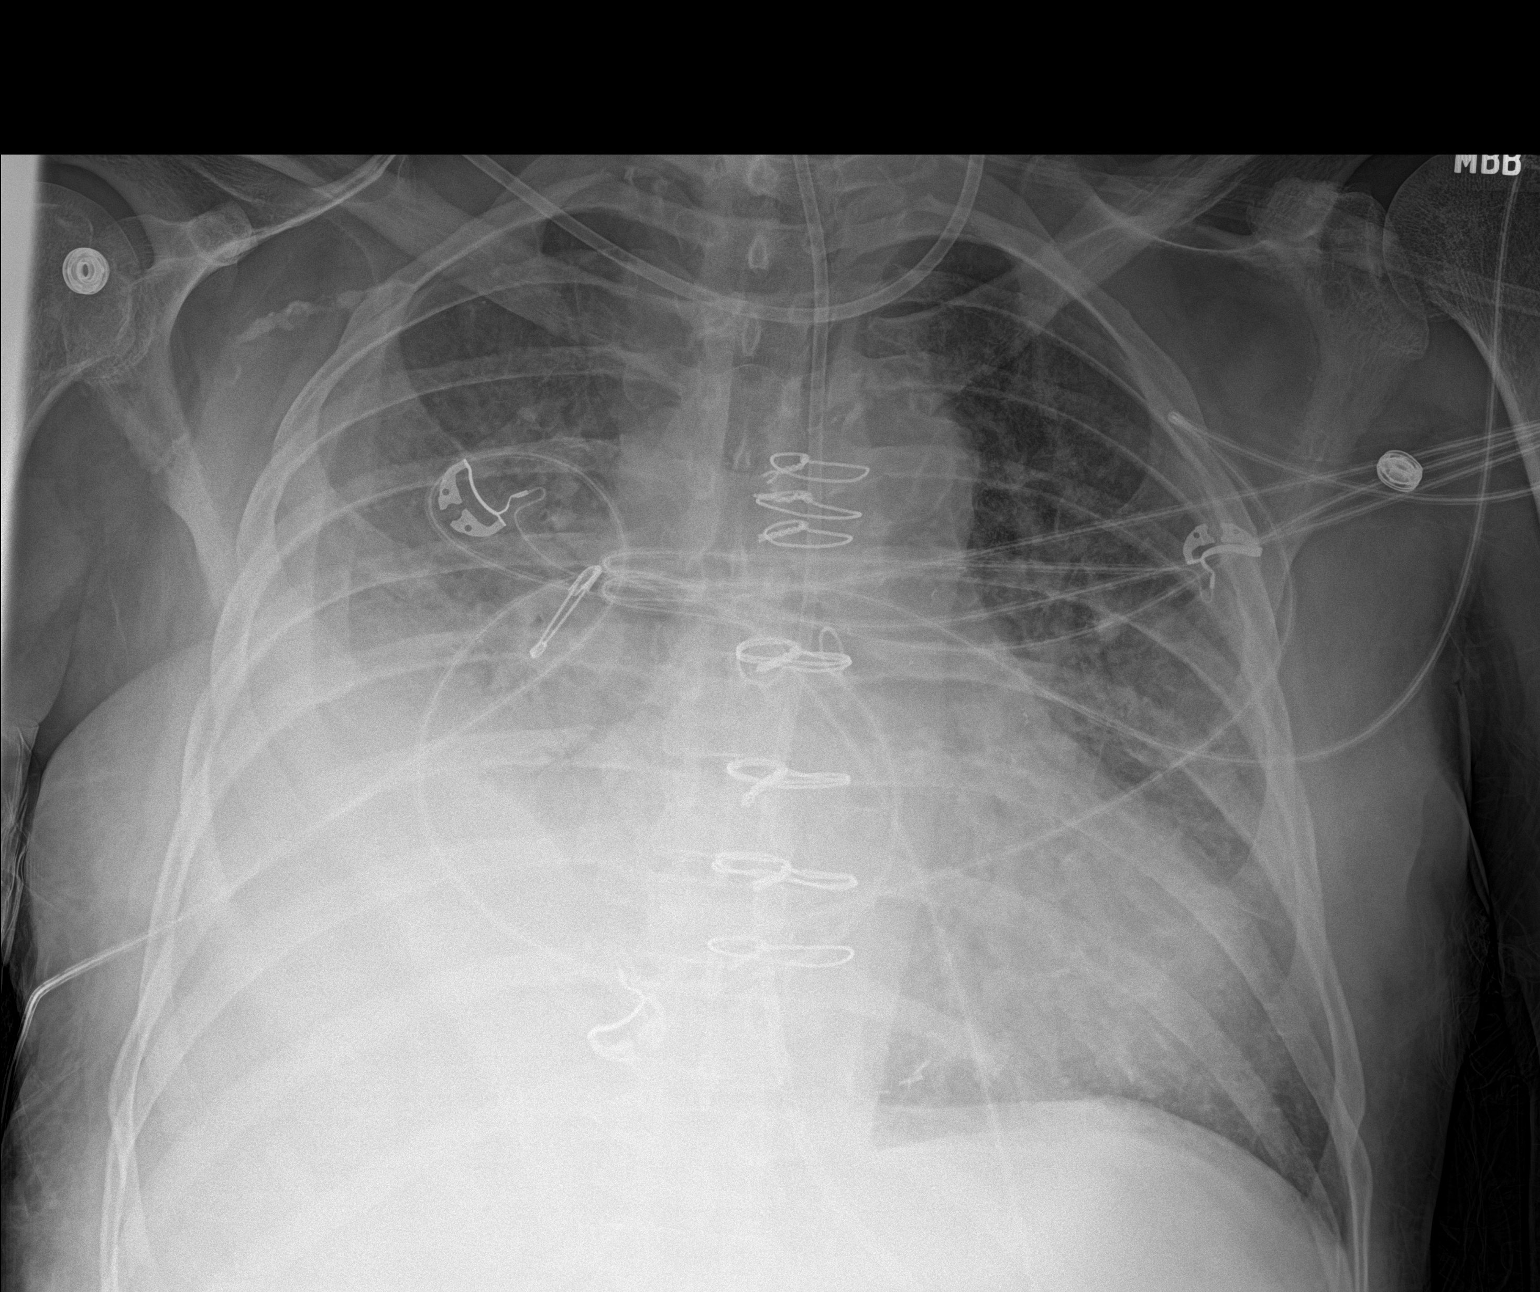

[1 of 1 positions shown; findings below may reference images not displayed]

FINDINGS: A feeding tube courses into the left upper abdomen with tip not
imaged. Sequelae of prior CABG are again identified. The cardiac
silhouette remains enlarged. Pulmonary vascular congestion and
interstitial edema are similar to the prior study. A large right
pleural effusion also has not significantly changed, and there is
associated right lung atelectasis. No pneumothorax is identified.
IMPRESSION: Unchanged pulmonary edema and large right pleural effusion.

## 2019-01-30 IMAGING — DX DG CHEST 2V
2 series · 2 of 2 positions shown · non-contrast
Comparison: Portable chest x-ray of 07/29/2017, and CT chest of
07/11/2017

CLINICAL DATA: History of pleural effusion, follow-up

EXAM:
CHEST - 2 VIEW

[chest lat]
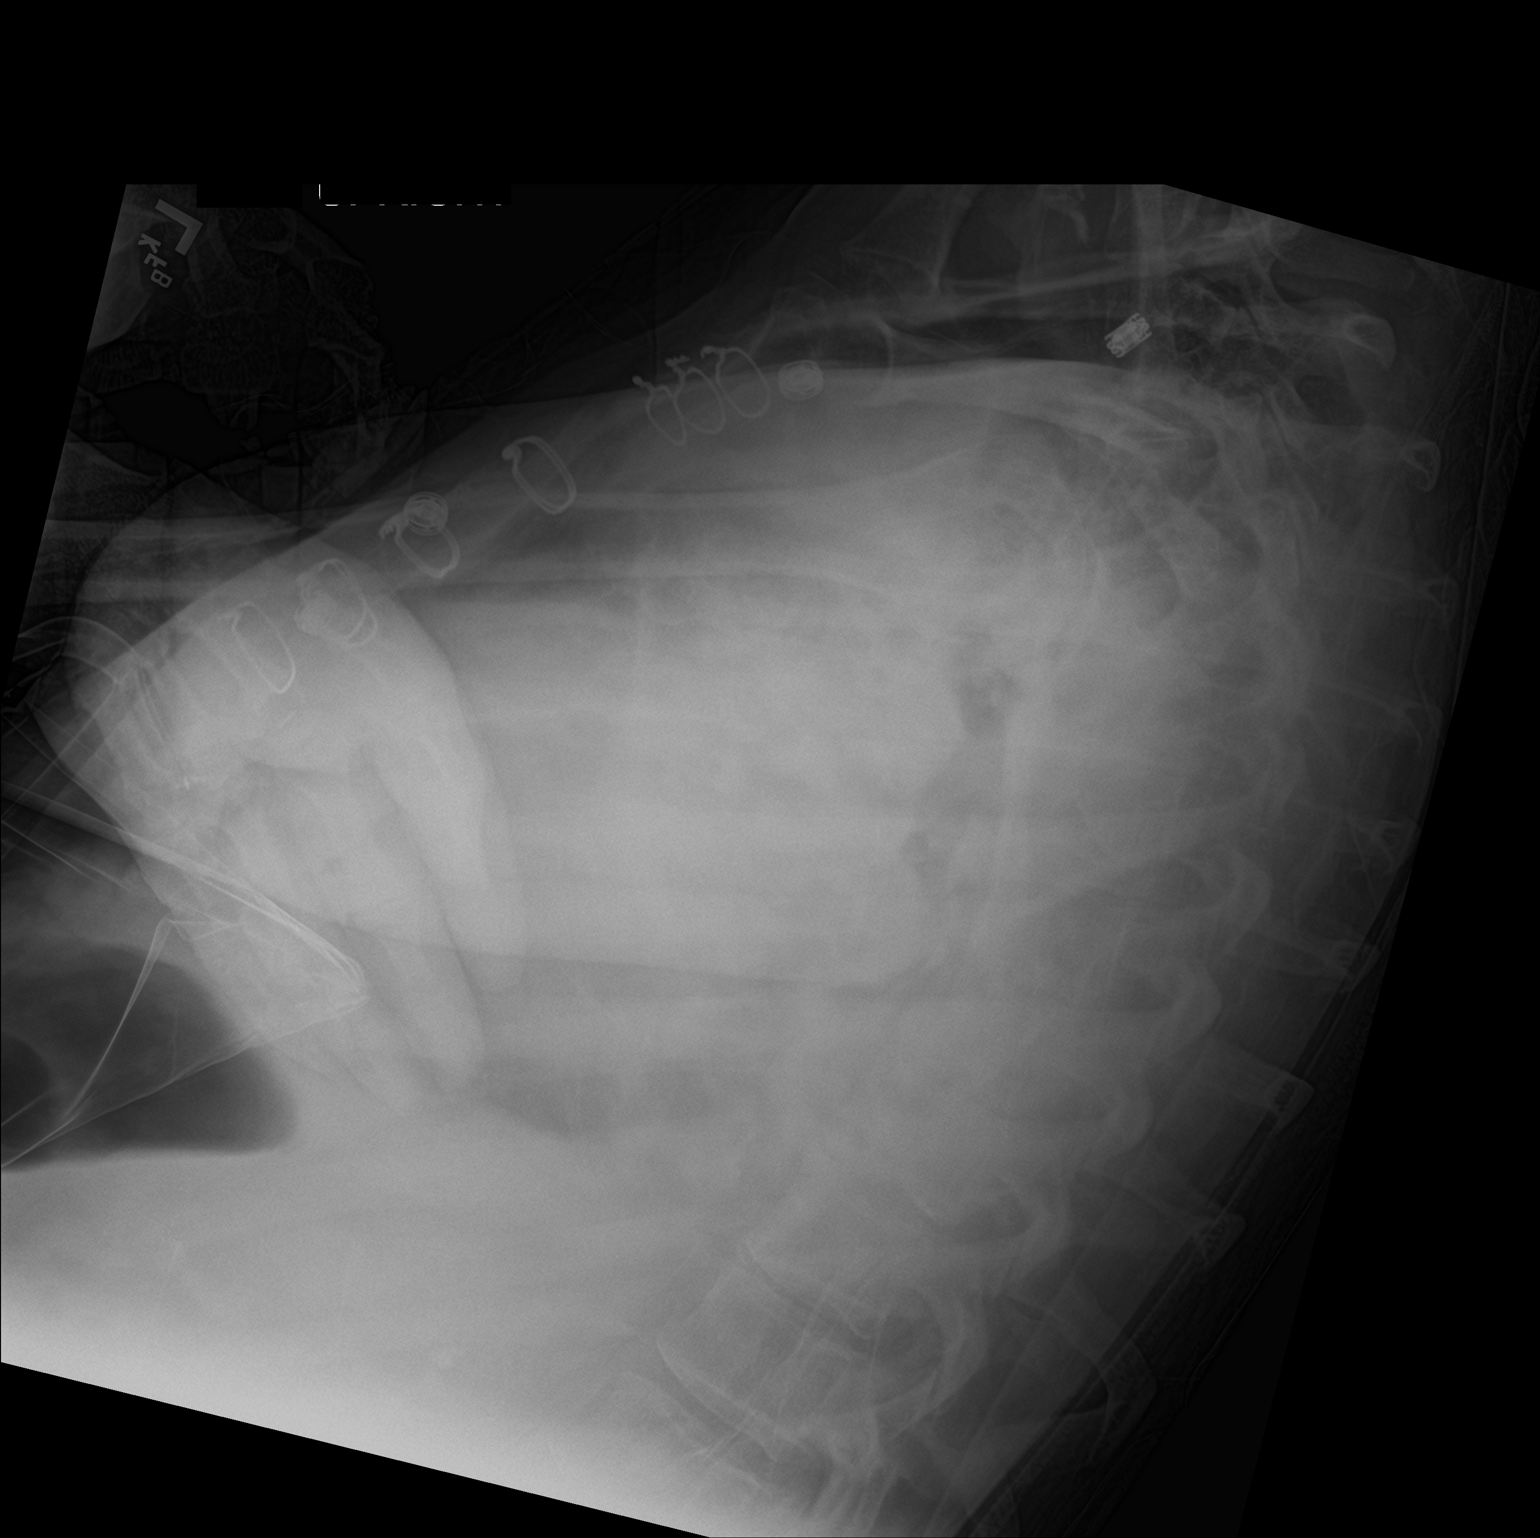

[chest ap]
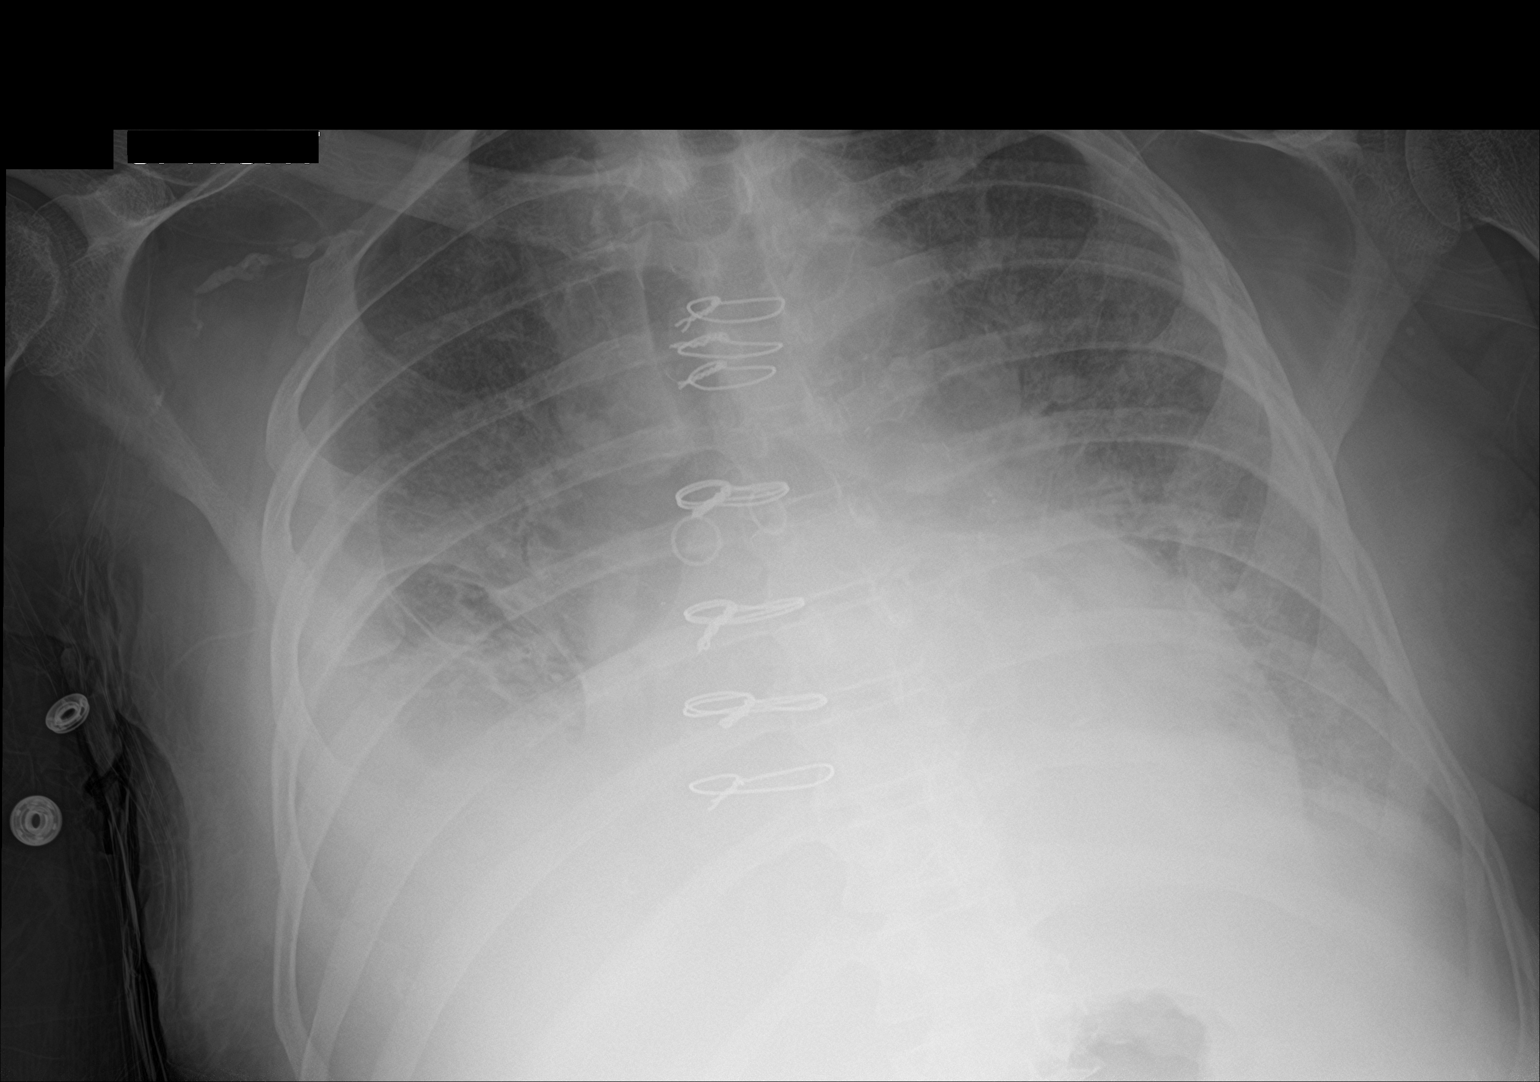

[2 of 2 positions shown; findings below may reference images not displayed]

FINDINGS: The lungs are very poorly aerated. There are bilateral pleural
effusions present with volume loss at the lung bases and
cardiomegaly. There does appear to be a degree of pulmonary vascular
congestion, findings consistent with CHF. There is a questionable
nodule in left mid lung on this portable film. I did review the CT
chest of 07/11/2017 and do not see a nodule although at that time
there was considerable volume loss with left effusion present which
could obscure a nodule. Attention to this area on follow-up chest
x-rays is recommended.
IMPRESSION: 1. Poor aeration with moderate-sized bilateral pleural effusions and
probable CHF.
2. Questionable nodule in the left mid lung not definitely seen on
CT chest 07/11/2017. Recommend attention to this area on follow-up
chest x-rays.
